# Patient Record
Sex: Female | Born: 1950 | Race: Black or African American | Hispanic: No | Marital: Married
Health system: Midwestern US, Community
[De-identification: ages and names within clinical notes are randomized; demographics above are authoritative.]

## PROBLEM LIST (undated history)

## (undated) DIAGNOSIS — M545 Low back pain, unspecified: Secondary | ICD-10-CM

## (undated) DIAGNOSIS — E041 Nontoxic single thyroid nodule: Secondary | ICD-10-CM

## (undated) DIAGNOSIS — G9341 Metabolic encephalopathy: Secondary | ICD-10-CM

## (undated) DIAGNOSIS — R079 Chest pain, unspecified: Secondary | ICD-10-CM

## (undated) DIAGNOSIS — G629 Polyneuropathy, unspecified: Secondary | ICD-10-CM

## (undated) DIAGNOSIS — R6510 Systemic inflammatory response syndrome (SIRS) of non-infectious origin without acute organ dysfunction: Principal | ICD-10-CM

## (undated) DIAGNOSIS — K2900 Acute gastritis without bleeding: Secondary | ICD-10-CM

## (undated) DIAGNOSIS — J441 Chronic obstructive pulmonary disease with (acute) exacerbation: Principal | ICD-10-CM

## (undated) DIAGNOSIS — M25551 Pain in right hip: Secondary | ICD-10-CM

## (undated) DIAGNOSIS — I10 Essential (primary) hypertension: Secondary | ICD-10-CM

## (undated) DIAGNOSIS — M5431 Sciatica, right side: Secondary | ICD-10-CM

## (undated) DIAGNOSIS — E1165 Type 2 diabetes mellitus with hyperglycemia: Secondary | ICD-10-CM

## (undated) DIAGNOSIS — F4310 Post-traumatic stress disorder, unspecified: Secondary | ICD-10-CM

## (undated) DIAGNOSIS — M1612 Unilateral primary osteoarthritis, left hip: Secondary | ICD-10-CM

## (undated) DIAGNOSIS — K439 Ventral hernia without obstruction or gangrene: Secondary | ICD-10-CM

## (undated) DIAGNOSIS — M109 Gout, unspecified: Secondary | ICD-10-CM

## (undated) DIAGNOSIS — B1920 Unspecified viral hepatitis C without hepatic coma: Secondary | ICD-10-CM

## (undated) DIAGNOSIS — R911 Solitary pulmonary nodule: Secondary | ICD-10-CM

## (undated) DIAGNOSIS — M129 Arthropathy, unspecified: Secondary | ICD-10-CM

## (undated) DIAGNOSIS — J4520 Mild intermittent asthma, uncomplicated: Secondary | ICD-10-CM

## (undated) DIAGNOSIS — Z96641 Presence of right artificial hip joint: Secondary | ICD-10-CM

## (undated) DIAGNOSIS — J45901 Unspecified asthma with (acute) exacerbation: Secondary | ICD-10-CM

## (undated) DIAGNOSIS — G8929 Other chronic pain: Secondary | ICD-10-CM

## (undated) DIAGNOSIS — Z760 Encounter for issue of repeat prescription: Secondary | ICD-10-CM

## (undated) DIAGNOSIS — G4700 Insomnia, unspecified: Secondary | ICD-10-CM

## (undated) DIAGNOSIS — E1140 Type 2 diabetes mellitus with diabetic neuropathy, unspecified: Secondary | ICD-10-CM

## (undated) DIAGNOSIS — Z1231 Encounter for screening mammogram for malignant neoplasm of breast: Secondary | ICD-10-CM

## (undated) DIAGNOSIS — F5101 Primary insomnia: Secondary | ICD-10-CM

## (undated) DIAGNOSIS — N390 Urinary tract infection, site not specified: Secondary | ICD-10-CM

## (undated) DIAGNOSIS — E119 Type 2 diabetes mellitus without complications: Secondary | ICD-10-CM

## (undated) DIAGNOSIS — M160 Bilateral primary osteoarthritis of hip: Secondary | ICD-10-CM

## (undated) DIAGNOSIS — G4733 Obstructive sleep apnea (adult) (pediatric): Secondary | ICD-10-CM

## (undated) DIAGNOSIS — M47816 Spondylosis without myelopathy or radiculopathy, lumbar region: Secondary | ICD-10-CM

## (undated) DIAGNOSIS — I509 Heart failure, unspecified: Secondary | ICD-10-CM

## (undated) DIAGNOSIS — E1121 Type 2 diabetes mellitus with diabetic nephropathy: Secondary | ICD-10-CM

## (undated) DIAGNOSIS — R109 Unspecified abdominal pain: Secondary | ICD-10-CM

## (undated) DIAGNOSIS — J309 Allergic rhinitis, unspecified: Secondary | ICD-10-CM

## (undated) DIAGNOSIS — M461 Sacroiliitis, not elsewhere classified: Secondary | ICD-10-CM

## (undated) DIAGNOSIS — M25559 Pain in unspecified hip: Secondary | ICD-10-CM

## (undated) DIAGNOSIS — M79604 Pain in right leg: Secondary | ICD-10-CM

---

## 2004-05-10 ENCOUNTER — Ambulatory Visit: Payer: Self-pay | Admitting: Family Medicine

## 2005-07-26 ENCOUNTER — Ambulatory Visit: Payer: Self-pay | Admitting: Family Medicine

## 2006-09-07 ENCOUNTER — Emergency Department: Payer: Self-pay | Admitting: Unknown Physician Specialty

## 2006-09-07 ENCOUNTER — Other Ambulatory Visit: Payer: Self-pay

## 2007-05-07 ENCOUNTER — Ambulatory Visit: Payer: Self-pay | Admitting: Family Medicine

## 2008-01-17 ENCOUNTER — Emergency Department: Payer: Self-pay | Admitting: Emergency Medicine

## 2008-01-17 ENCOUNTER — Ambulatory Visit: Payer: Self-pay | Admitting: Family Medicine

## 2008-01-21 ENCOUNTER — Ambulatory Visit: Payer: Self-pay | Admitting: Family Medicine

## 2008-09-09 ENCOUNTER — Ambulatory Visit: Payer: Self-pay

## 2008-12-01 ENCOUNTER — Emergency Department: Payer: Self-pay | Admitting: Emergency Medicine

## 2008-12-04 ENCOUNTER — Emergency Department: Payer: Self-pay | Admitting: Emergency Medicine

## 2009-02-19 ENCOUNTER — Ambulatory Visit: Payer: Self-pay | Admitting: Family Medicine

## 2009-04-27 ENCOUNTER — Ambulatory Visit: Payer: Self-pay | Admitting: Gastroenterology

## 2009-04-29 ENCOUNTER — Ambulatory Visit: Payer: Self-pay | Admitting: Gastroenterology

## 2010-02-25 ENCOUNTER — Ambulatory Visit: Payer: Self-pay | Admitting: Family Medicine

## 2011-04-27 ENCOUNTER — Ambulatory Visit: Payer: Self-pay | Admitting: Family Medicine

## 2012-05-01 ENCOUNTER — Ambulatory Visit: Payer: Self-pay | Admitting: Family Medicine

## 2013-02-19 ENCOUNTER — Ambulatory Visit: Payer: Self-pay | Admitting: Family Medicine

## 2013-03-13 ENCOUNTER — Ambulatory Visit: Payer: Self-pay | Admitting: Family Medicine

## 2013-04-23 ENCOUNTER — Ambulatory Visit: Payer: Self-pay | Admitting: Family Medicine

## 2013-05-29 ENCOUNTER — Ambulatory Visit: Payer: Self-pay | Admitting: Family Medicine

## 2013-11-01 LAB — CBC WITH DIFFERENTIAL/PLATELET
Basophil #: 0 10*3/uL (ref 0.0–0.1)
Basophil %: 0.5 %
Eosinophil #: 0.1 10*3/uL (ref 0.0–0.7)
Eosinophil %: 1.1 %
HCT: 45.3 % (ref 35.0–47.0)
HGB: 14.7 g/dL (ref 12.0–16.0)
Lymphocyte #: 1.8 10*3/uL (ref 1.0–3.6)
Lymphocyte %: 19.8 %
MCH: 28.7 pg (ref 26.0–34.0)
MCHC: 32.5 g/dL (ref 32.0–36.0)
MCV: 88 fL (ref 80–100)
MONO ABS: 0.7 x10 3/mm (ref 0.2–0.9)
MONOS PCT: 8 %
Neutrophil #: 6.5 10*3/uL (ref 1.4–6.5)
Neutrophil %: 70.6 %
Platelet: 262 10*3/uL (ref 150–440)
RBC: 5.12 10*6/uL (ref 3.80–5.20)
RDW: 13.4 % (ref 11.5–14.5)
WBC: 9.2 10*3/uL (ref 3.6–11.0)

## 2013-11-01 LAB — COMPREHENSIVE METABOLIC PANEL
ANION GAP: 5 — AB (ref 7–16)
AST: 36 U/L (ref 15–37)
Albumin: 3.5 g/dL (ref 3.4–5.0)
Alkaline Phosphatase: 66 U/L
BILIRUBIN TOTAL: 0.6 mg/dL (ref 0.2–1.0)
BUN: 11 mg/dL (ref 7–18)
CALCIUM: 9.3 mg/dL (ref 8.5–10.1)
CHLORIDE: 95 mmol/L — AB (ref 98–107)
CREATININE: 0.69 mg/dL (ref 0.60–1.30)
Co2: 30 mmol/L (ref 21–32)
GLUCOSE: 127 mg/dL — AB (ref 65–99)
Osmolality: 262 (ref 275–301)
Potassium: 3.9 mmol/L (ref 3.5–5.1)
SGPT (ALT): 36 U/L (ref 12–78)
Sodium: 130 mmol/L — ABNORMAL LOW (ref 136–145)
Total Protein: 9.4 g/dL — ABNORMAL HIGH (ref 6.4–8.2)

## 2013-11-01 LAB — URINALYSIS, COMPLETE
Bacteria: NONE SEEN
Bilirubin,UR: NEGATIVE
Blood: NEGATIVE
NITRITE: NEGATIVE
Ph: 5 (ref 4.5–8.0)
Protein: 30
Specific Gravity: 1.017 (ref 1.003–1.030)

## 2013-11-01 LAB — TROPONIN I: Troponin-I: <0.02 ng/mL

## 2013-11-01 LAB — LIPASE, BLOOD: Lipase: 58 U/L — ABNORMAL LOW (ref 73–393)

## 2013-11-02 ENCOUNTER — Inpatient Hospital Stay: Payer: Self-pay | Admitting: Family Medicine

## 2013-11-02 LAB — CREATININE, SERUM
CREATININE: 0.78 mg/dL (ref 0.60–1.30)
EGFR (African American): >60
EGFR (Non-African Amer.): >60

## 2013-11-02 LAB — HEMOGLOBIN A1C: HEMOGLOBIN A1C: 6.5 % — AB (ref 4.2–6.3)

## 2013-11-03 LAB — CBC WITH DIFFERENTIAL/PLATELET
BASOS ABS: 0 10*3/uL (ref 0.0–0.1)
Basophil %: 0.4 %
EOS ABS: 0.1 10*3/uL (ref 0.0–0.7)
Eosinophil %: 1 %
HCT: 40.3 % (ref 35.0–47.0)
HGB: 13.6 g/dL (ref 12.0–16.0)
LYMPHS ABS: 1.4 10*3/uL (ref 1.0–3.6)
Lymphocyte %: 20.3 %
MCH: 29.3 pg (ref 26.0–34.0)
MCHC: 33.8 g/dL (ref 32.0–36.0)
MCV: 87 fL (ref 80–100)
Monocyte #: 0.5 x10 3/mm (ref 0.2–0.9)
Monocyte %: 7 %
NEUTROS ABS: 4.9 10*3/uL (ref 1.4–6.5)
Neutrophil %: 71.3 %
PLATELETS: 239 10*3/uL (ref 150–440)
RBC: 4.65 10*6/uL (ref 3.80–5.20)
RDW: 13.5 % (ref 11.5–14.5)
WBC: 6.8 10*3/uL (ref 3.6–11.0)

## 2013-11-03 LAB — COMPREHENSIVE METABOLIC PANEL
ALBUMIN: 3 g/dL — AB (ref 3.4–5.0)
ALK PHOS: 53 U/L
ANION GAP: 1 — AB (ref 7–16)
BUN: 4 mg/dL — ABNORMAL LOW (ref 7–18)
Bilirubin,Total: 0.5 mg/dL (ref 0.2–1.0)
CALCIUM: 8.7 mg/dL (ref 8.5–10.1)
CO2: 32 mmol/L (ref 21–32)
Chloride: 102 mmol/L (ref 98–107)
Creatinine: 0.6 mg/dL (ref 0.60–1.30)
Glucose: 114 mg/dL — ABNORMAL HIGH (ref 65–99)
OSMOLALITY: 268 (ref 275–301)
Potassium: 3.3 mmol/L — ABNORMAL LOW (ref 3.5–5.1)
SGOT(AST): 24 U/L (ref 15–37)
SGPT (ALT): 25 U/L (ref 12–78)
SODIUM: 135 mmol/L — AB (ref 136–145)
Total Protein: 8.1 g/dL (ref 6.4–8.2)

## 2013-11-03 LAB — URINE CULTURE

## 2013-11-04 ENCOUNTER — Ambulatory Visit: Payer: Self-pay | Admitting: Orthopaedic Surgery

## 2013-11-05 LAB — BASIC METABOLIC PANEL
Anion Gap: 4 — ABNORMAL LOW (ref 7–16)
BUN: 9 mg/dL (ref 7–18)
CHLORIDE: 106 mmol/L (ref 98–107)
CO2: 28 mmol/L (ref 21–32)
CREATININE: 0.85 mg/dL (ref 0.60–1.30)
Calcium, Total: 8.9 mg/dL (ref 8.5–10.1)
EGFR (African American): >60
GLUCOSE: 86 mg/dL (ref 65–99)
OSMOLALITY: 274 (ref 275–301)
Potassium: 3.3 mmol/L — ABNORMAL LOW (ref 3.5–5.1)
Sodium: 138 mmol/L (ref 136–145)

## 2013-12-05 ENCOUNTER — Emergency Department: Payer: Self-pay | Admitting: Emergency Medicine

## 2013-12-05 LAB — COMPREHENSIVE METABOLIC PANEL
AST: 17 U/L (ref 15–37)
Albumin: 3.3 g/dL — ABNORMAL LOW (ref 3.4–5.0)
Alkaline Phosphatase: 90 U/L
Anion Gap: 4 — ABNORMAL LOW (ref 7–16)
BUN: 15 mg/dL (ref 7–18)
Bilirubin,Total: 0.5 mg/dL (ref 0.2–1.0)
CALCIUM: 9.5 mg/dL (ref 8.5–10.1)
CHLORIDE: 99 mmol/L (ref 98–107)
Co2: 34 mmol/L — ABNORMAL HIGH (ref 21–32)
Creatinine: 0.52 mg/dL — ABNORMAL LOW (ref 0.60–1.30)
EGFR (African American): >60
GLUCOSE: 142 mg/dL — AB (ref 65–99)
OSMOLALITY: 277 (ref 275–301)
Potassium: 3.4 mmol/L — ABNORMAL LOW (ref 3.5–5.1)
SGPT (ALT): 33 U/L (ref 12–78)
SODIUM: 137 mmol/L (ref 136–145)
Total Protein: 8.9 g/dL — ABNORMAL HIGH (ref 6.4–8.2)

## 2013-12-05 LAB — BODY FLUID CELL COUNT WITH DIFFERENTIAL
BASOS ABS: 0 %
Eosinophil: 0 %
Lymphocytes: 50 %
NUCLEATED CELL COUNT: 31 /mm3
Neutrophils: 11 %
OTHER CELLS BF: 0 %
OTHER MONONUCLEAR CELLS: 39 %

## 2013-12-05 LAB — CBC WITH DIFFERENTIAL/PLATELET
BASOS ABS: 0 10*3/uL (ref 0.0–0.1)
Basophil %: 0.4 %
Eosinophil #: 0.1 10*3/uL (ref 0.0–0.7)
Eosinophil %: 1 %
HCT: 45.4 % (ref 35.0–47.0)
HGB: 15.2 g/dL (ref 12.0–16.0)
LYMPHS PCT: 24.4 %
Lymphocyte #: 2.7 10*3/uL (ref 1.0–3.6)
MCH: 29.1 pg (ref 26.0–34.0)
MCHC: 33.6 g/dL (ref 32.0–36.0)
MCV: 87 fL (ref 80–100)
MONO ABS: 0.8 x10 3/mm (ref 0.2–0.9)
MONOS PCT: 6.9 %
Neutrophil #: 7.6 10*3/uL — ABNORMAL HIGH (ref 1.4–6.5)
Neutrophil %: 67.3 %
Platelet: 179 10*3/uL (ref 150–440)
RBC: 5.24 10*6/uL — AB (ref 3.80–5.20)
RDW: 13.8 % (ref 11.5–14.5)
WBC: 11.2 10*3/uL — ABNORMAL HIGH (ref 3.6–11.0)

## 2013-12-11 ENCOUNTER — Inpatient Hospital Stay: Payer: Self-pay | Admitting: Internal Medicine

## 2013-12-11 LAB — CBC
HCT: 46.6 % (ref 35.0–47.0)
HGB: 15.6 g/dL (ref 12.0–16.0)
MCH: 28.9 pg (ref 26.0–34.0)
MCHC: 33.5 g/dL (ref 32.0–36.0)
MCV: 86 fL (ref 80–100)
PLATELETS: 232 10*3/uL (ref 150–440)
RBC: 5.4 10*6/uL — ABNORMAL HIGH (ref 3.80–5.20)
RDW: 14.2 % (ref 11.5–14.5)
WBC: 11.9 10*3/uL — ABNORMAL HIGH (ref 3.6–11.0)

## 2013-12-11 LAB — COMPREHENSIVE METABOLIC PANEL
ALK PHOS: 97 U/L
ANION GAP: 7 (ref 7–16)
AST: 22 U/L (ref 15–37)
Albumin: 3.4 g/dL (ref 3.4–5.0)
BILIRUBIN TOTAL: 0.5 mg/dL (ref 0.2–1.0)
BUN: 11 mg/dL (ref 7–18)
CREATININE: 0.82 mg/dL (ref 0.60–1.30)
Calcium, Total: 9.1 mg/dL (ref 8.5–10.1)
Chloride: 105 mmol/L (ref 98–107)
Co2: 30 mmol/L (ref 21–32)
EGFR (Non-African Amer.): >60
GLUCOSE: 166 mg/dL — AB (ref 65–99)
Osmolality: 286 (ref 275–301)
Potassium: 2.9 mmol/L — ABNORMAL LOW (ref 3.5–5.1)
SGPT (ALT): 43 U/L (ref 12–78)
Sodium: 142 mmol/L (ref 136–145)
Total Protein: 9.5 g/dL — ABNORMAL HIGH (ref 6.4–8.2)

## 2013-12-11 LAB — URINALYSIS, COMPLETE
BILIRUBIN, UR: NEGATIVE
GLUCOSE, UR: NEGATIVE mg/dL (ref 0–75)
KETONE: NEGATIVE
Nitrite: NEGATIVE
PH: 6 (ref 4.5–8.0)
RBC,UR: 41 /HPF (ref 0–5)
Specific Gravity: 1.006 (ref 1.003–1.030)
Squamous Epithelial: 6
WBC UR: 5 /HPF (ref 0–5)

## 2013-12-11 LAB — DRUG SCREEN, URINE
Amphetamines, Ur Screen: NEGATIVE (ref ?–1000)
BENZODIAZEPINE, UR SCRN: NEGATIVE (ref ?–200)
Barbiturates, Ur Screen: NEGATIVE (ref ?–200)
CANNABINOID 50 NG, UR ~~LOC~~: NEGATIVE (ref ?–50)
COCAINE METABOLITE, UR ~~LOC~~: NEGATIVE (ref ?–300)
MDMA (ECSTASY) UR SCREEN: NEGATIVE (ref ?–500)
METHADONE, UR SCREEN: NEGATIVE (ref ?–300)
Opiate, Ur Screen: NEGATIVE (ref ?–300)
Phencyclidine (PCP) Ur S: NEGATIVE (ref ?–25)
TRICYCLIC, UR SCREEN: NEGATIVE (ref ?–1000)

## 2013-12-11 LAB — POTASSIUM: POTASSIUM: 3.3 mmol/L — AB (ref 3.5–5.1)

## 2013-12-11 LAB — ACETAMINOPHEN LEVEL
ACETAMINOPHEN: 8 ug/mL — AB
Acetaminophen: 301 ug/mL
Acetaminophen: 63 ug/mL — ABNORMAL HIGH

## 2013-12-11 LAB — LIPASE, BLOOD: Lipase: 94 U/L (ref 73–393)

## 2013-12-11 LAB — MAGNESIUM: Magnesium: 1.6 mg/dL — ABNORMAL LOW

## 2013-12-11 LAB — SALICYLATE LEVEL

## 2013-12-11 LAB — ETHANOL: Ethanol %: <0.003 % (ref 0.000–0.080)

## 2013-12-12 LAB — COMPREHENSIVE METABOLIC PANEL
ALBUMIN: 3 g/dL — AB (ref 3.4–5.0)
ALK PHOS: 75 U/L
ALT: 35 U/L (ref 12–78)
Anion Gap: 13 (ref 7–16)
BILIRUBIN TOTAL: 0.7 mg/dL (ref 0.2–1.0)
BUN: 15 mg/dL (ref 7–18)
Calcium, Total: 8.9 mg/dL (ref 8.5–10.1)
Chloride: 101 mmol/L (ref 98–107)
Co2: 23 mmol/L (ref 21–32)
Creatinine: 0.96 mg/dL (ref 0.60–1.30)
EGFR (Non-African Amer.): >60
GLUCOSE: 187 mg/dL — AB (ref 65–99)
OSMOLALITY: 280 (ref 275–301)
POTASSIUM: 2.9 mmol/L — AB (ref 3.5–5.1)
SGOT(AST): 20 U/L (ref 15–37)
SODIUM: 137 mmol/L (ref 136–145)
Total Protein: 8.8 g/dL — ABNORMAL HIGH (ref 6.4–8.2)

## 2013-12-12 LAB — CBC WITH DIFFERENTIAL/PLATELET
Basophil #: 0.1 10*3/uL (ref 0.0–0.1)
Basophil %: 0.4 %
EOS PCT: 0 %
Eosinophil #: 0 10*3/uL (ref 0.0–0.7)
HCT: 43.1 % (ref 35.0–47.0)
HGB: 14.5 g/dL (ref 12.0–16.0)
LYMPHS ABS: 3.6 10*3/uL (ref 1.0–3.6)
LYMPHS PCT: 19.7 %
MCH: 29 pg (ref 26.0–34.0)
MCHC: 33.7 g/dL (ref 32.0–36.0)
MCV: 86 fL (ref 80–100)
MONO ABS: 1.4 x10 3/mm — AB (ref 0.2–0.9)
MONOS PCT: 7.6 %
NEUTROS PCT: 72.3 %
Neutrophil #: 13.2 10*3/uL — ABNORMAL HIGH (ref 1.4–6.5)
PLATELETS: 295 10*3/uL (ref 150–440)
RBC: 5.01 10*6/uL (ref 3.80–5.20)
RDW: 14.6 % — AB (ref 11.5–14.5)
WBC: 18.3 10*3/uL — ABNORMAL HIGH (ref 3.6–11.0)

## 2013-12-12 LAB — SEDIMENTATION RATE: Erythrocyte Sed Rate: 6 mm/hr (ref 0–30)

## 2013-12-12 LAB — URINE CULTURE

## 2013-12-12 LAB — MAGNESIUM: Magnesium: 1.7 mg/dL — ABNORMAL LOW

## 2013-12-12 LAB — HEPATIC FUNCTION PANEL A (ARMC): BILIRUBIN DIRECT: 0.1 mg/dL (ref 0.00–0.20)

## 2013-12-12 LAB — ACETAMINOPHEN LEVEL: Acetaminophen: <2 ug/mL

## 2013-12-13 LAB — POTASSIUM: Potassium: 3.2 mmol/L — ABNORMAL LOW (ref 3.5–5.1)

## 2013-12-13 LAB — MAGNESIUM: MAGNESIUM: 1.7 mg/dL — AB

## 2013-12-13 LAB — PHOSPHORUS: Phosphorus: 1.8 mg/dL — ABNORMAL LOW (ref 2.5–4.9)

## 2013-12-14 LAB — POTASSIUM: Potassium: 3.8 mmol/L (ref 3.5–5.1)

## 2013-12-14 LAB — MAGNESIUM: Magnesium: 1.9 mg/dL

## 2013-12-14 LAB — PHOSPHORUS: PHOSPHORUS: 2.8 mg/dL (ref 2.5–4.9)

## 2013-12-16 LAB — CULTURE, BLOOD (SINGLE)

## 2014-01-08 LAB — AMB EXT HGBA1C: Hemoglobin A1c, External: 6.6

## 2014-01-26 ENCOUNTER — Emergency Department: Payer: Self-pay | Admitting: Emergency Medicine

## 2014-01-26 LAB — CBC WITH DIFFERENTIAL/PLATELET
BASOS PCT: 0.7 %
Basophil #: 0.1 10*3/uL (ref 0.0–0.1)
EOS ABS: 0.3 10*3/uL (ref 0.0–0.7)
Eosinophil %: 2.2 %
HCT: 47.6 % — ABNORMAL HIGH (ref 35.0–47.0)
HGB: 15.3 g/dL (ref 12.0–16.0)
LYMPHS PCT: 22.8 %
Lymphocyte #: 3.3 10*3/uL (ref 1.0–3.6)
MCH: 29.3 pg (ref 26.0–34.0)
MCHC: 32.1 g/dL (ref 32.0–36.0)
MCV: 91 fL (ref 80–100)
Monocyte #: 0.9 x10 3/mm (ref 0.2–0.9)
Monocyte %: 6.6 %
NEUTROS PCT: 67.7 %
Neutrophil #: 9.7 10*3/uL — ABNORMAL HIGH (ref 1.4–6.5)
PLATELETS: 274 10*3/uL (ref 150–440)
RBC: 5.22 10*6/uL — ABNORMAL HIGH (ref 3.80–5.20)
RDW: 15.6 % — AB (ref 11.5–14.5)
WBC: 14.3 10*3/uL — ABNORMAL HIGH (ref 3.6–11.0)

## 2014-01-26 LAB — COMPREHENSIVE METABOLIC PANEL
Albumin: 3.6 g/dL (ref 3.4–5.0)
Alkaline Phosphatase: 89 U/L
Anion Gap: 8 (ref 7–16)
BILIRUBIN TOTAL: 0.5 mg/dL (ref 0.2–1.0)
BUN: 15 mg/dL (ref 7–18)
CHLORIDE: 99 mmol/L (ref 98–107)
CREATININE: 0.68 mg/dL (ref 0.60–1.30)
Calcium, Total: 8.6 mg/dL (ref 8.5–10.1)
Co2: 27 mmol/L (ref 21–32)
GLUCOSE: 91 mg/dL (ref 65–99)
Osmolality: 269 (ref 275–301)
Potassium: 4.4 mmol/L (ref 3.5–5.1)
SGOT(AST): 34 U/L (ref 15–37)
SGPT (ALT): 37 U/L
SODIUM: 134 mmol/L — AB (ref 136–145)
Total Protein: 9.2 g/dL — ABNORMAL HIGH (ref 6.4–8.2)

## 2014-01-26 LAB — URINALYSIS, COMPLETE
Bilirubin,UR: NEGATIVE
Blood: NEGATIVE
Ketone: NEGATIVE
Nitrite: NEGATIVE
PH: 6 (ref 4.5–8.0)
Protein: NEGATIVE
RBC,UR: 4 /HPF (ref 0–5)
Specific Gravity: 1.01 (ref 1.003–1.030)
Squamous Epithelial: 12
WBC UR: 4 /HPF (ref 0–5)

## 2014-01-26 LAB — LIPASE, BLOOD: LIPASE: 100 U/L (ref 73–393)

## 2014-01-30 ENCOUNTER — Emergency Department: Payer: Self-pay | Admitting: Emergency Medicine

## 2014-02-11 ENCOUNTER — Emergency Department: Payer: Self-pay | Admitting: Internal Medicine

## 2014-03-05 ENCOUNTER — Ambulatory Visit: Payer: Self-pay | Admitting: Unknown Physician Specialty

## 2014-03-07 ENCOUNTER — Emergency Department: Payer: Self-pay | Admitting: Emergency Medicine

## 2014-04-18 ENCOUNTER — Ambulatory Visit: Payer: Self-pay | Admitting: Gastroenterology

## 2014-04-30 ENCOUNTER — Emergency Department: Payer: Self-pay | Admitting: Emergency Medicine

## 2014-04-30 LAB — URINALYSIS, COMPLETE
BILIRUBIN, UR: NEGATIVE
Bacteria: NONE SEEN
Blood: NEGATIVE
Ketone: NEGATIVE
NITRITE: NEGATIVE
PH: 7 (ref 4.5–8.0)
PROTEIN: NEGATIVE
Specific Gravity: 1.005 (ref 1.003–1.030)
Squamous Epithelial: 13

## 2014-05-01 LAB — URINE CULTURE

## 2014-05-10 ENCOUNTER — Emergency Department: Payer: Self-pay | Admitting: Emergency Medicine

## 2014-07-21 ENCOUNTER — Ambulatory Visit: Admit: 2014-07-21 | Discharge: 2014-07-21 | Payer: MEDICARE | Attending: Family Medicine | Primary: Physician Assistant

## 2014-07-21 DIAGNOSIS — G8929 Other chronic pain: Secondary | ICD-10-CM

## 2014-07-21 MED ORDER — OXYCODONE-ACETAMINOPHEN 10 MG-325 MG TAB
10-325 mg | ORAL_TABLET | Freq: Two times a day (BID) | ORAL | Status: DC | PRN
Start: 2014-07-21 — End: 2014-08-01

## 2014-07-21 MED ORDER — GABAPENTIN 600 MG TAB
600 mg | ORAL_TABLET | Freq: Three times a day (TID) | ORAL | Status: DC
Start: 2014-07-21 — End: 2014-08-24

## 2014-07-21 MED ORDER — MELOXICAM 7.5 MG TAB
7.5 mg | ORAL_TABLET | Freq: Every day | ORAL | Status: DC
Start: 2014-07-21 — End: 2014-08-24

## 2014-07-21 NOTE — Progress Notes (Signed)
Leg Pain        HPI: [name] FantasiaMonica [name] is a 64 y.o. female BLACK OR [place]  New patient,  Recently moved from [place], [place] last week, to take care of her 62 year old husband.  She has hx of Hypertension, dx 2001- she has not used her BP medications this morning and does not check regularly at home., She was dx with hepatitis C last year and was to undergo treatment however due to her precipitous move she was advised not to start this in [place].     She also has [facility].  She no longer has recurrent anxiety, or dreams in regards to this    She is mainly concerned today for R hip pain. This has been present for several years roughly since 2001.  Pain starts in the buttocks and radiates laterally and then across to anterior part of leg. She reports she has had MRI of the hip and possibly the low back, however she is not sure of the results of these tests. She was being managed by Dr Mathis DadScott Runyon at [facility] and has brought in her chart notes which I have reviewed. He diagnosed her with hip bursitis, chronic sacroiliac joint pain, lumbar facet arthropathy.        Past Medical History   Diagnosis Date   ??? Chronic pain    ??? Hypertension    ??? GERD (gastroesophageal reflux disease)    ??? PTSD (post-traumatic stress disorder)      [facility] [place] [facility] [place]       Current Outpatient Prescriptions   Medication Sig   ??? cyclobenzaprine (FLEXERIL) 10 mg tablet Take  by mouth three (3) times daily as needed for Muscle Spasm(s).   ??? hydrALAZINE (APRESOLINE) 10 mg tablet Take 20 mg by mouth every eight (8) hours.   ??? lisinopril (PRINIVIL, ZESTRIL) 20 mg tablet Take 40 mg by mouth daily.   ??? NIFEdipine ER (PROCARDIA XL) 60 mg ER tablet Take 60 mg by mouth daily.   ??? metoprolol (LOPRESSOR) 50 mg tablet Take  by mouth two (2) times a day.   ??? Walker misc by Does Not Apply route.   ??? oxyCODONE-acetaminophen (PERCOCET 10) 10-325 mg per tablet Take 1 Tab by  mouth two (2) times daily as needed for Pain. Max Daily Amount: 2 Tabs.   ??? gabapentin (NEURONTIN) 600 mg tablet Take 1 Tab by mouth three (3) times daily.   ??? meloxicam (MOBIC) 7.5 mg tablet Take 1 Tab by mouth daily.     No current facility-administered medications for this visit.       No Known Allergies    Past Surgical History   Procedure Laterality Date   ??? Hx gyn  2010     hysterectomy       Family History   Problem Relation Age of Onset   ??? Hypertension Mother    ??? Cancer Maternal Aunt    ??? Alcohol abuse Maternal Grandfather        History     Social History   ??? Marital Status: MARRIED     Spouse Name: N/A     Number of Children: N/A   ??? Years of Education: N/A     Occupational History   ??? Not on file.     Social History Main Topics   ??? Smoking status: Never Smoker    ??? Smokeless tobacco: Never Used   ???  Alcohol Use: No   ??? Drug Use: No   ??? Sexual Activity: No     Other Topics Concern   ??? Not on file     Social History Narrative   ??? No narrative on file       Gen- No weight loss, No Malaise, No fatigue  Eyes- No dipoplia, blurry vision  CVS- No Chest pain, no palpitations, no edema  Resp- No Cough, SOB, DOE, Wheezing  Neuro- Mild frontal headache today  Skin- No easy bruising, No rashes      BP 170/104 mmHg   Pulse 80   Temp(Src) 99 ??F (37.2 ??C) (Oral)   Resp 18   Ht 5' 6.93" (1.7 m)   Wt 177 lb 12.8 oz (80.65 kg)   BMI 27.91 kg/m2   SpO2 97%    Physical Exam   Constitutional: She is oriented to person, place, and time. She appears well-developed and well-nourished. No distress.   HENT:   Head: Normocephalic and atraumatic.   Right Ear: External ear normal.   Left Ear: External ear normal.   Eyes: EOM are normal. Pupils are equal, round, and reactive to light. Right eye exhibits no discharge. Left eye exhibits no discharge. No scleral icterus.   Cardiovascular: Normal rate, regular rhythm, normal heart sounds and intact distal pulses.    No murmur heard.   Pulmonary/Chest: Effort normal and breath sounds normal. No respiratory distress. She has no wheezes. She has no rales.   Musculoskeletal:        Lumbar back: She exhibits tenderness (L5 spinous process; tenderness over R buttock).   Neurological: She is alert and oriented to person, place, and time.   SLE + on right at 30 degrees,  SLE - on Left     Skin: Skin is warm and dry. She is not diaphoretic.   Psychiatric: She has a normal mood and affect.   Nursing note and vitals reviewed.          Assesment:  1. Hip pain, chronic, right  Will request records from previous PCP and pain management, may need referral to [facility] management  - oxyCODONE-acetaminophen (PERCOCET 10) 10-325 mg per tablet; Take 1 Tab by mouth two (2) times daily as needed for Pain. Max Daily Amount: 2 Tabs.  Dispense: 30 Tab; Refill: 0  - meloxicam (MOBIC) 7.5 mg tablet; Take 1 Tab by mouth daily.  Dispense: 60 Tab; Refill: 0    2. Sciatica, right  Increase dose of gababpentin  - gabapentin (NEURONTIN) 600 mg tablet; Take 1 Tab by mouth three (3) times daily.  Dispense: 90 Tab; Refill: 0    3. Essential hypertension  Uncontrolled. Patient has not taken medication today. Will readdress at next appt    4. Hepatitis C virus infection, unspecified chronicity  Will obtain records from previous PCP and referral to hepatologist as appropriate.        Advance Care Planning:   Patient was offered the opportunity to discuss advance care planning YES   Does patient have an Advance Directive:  NO Patient given [place] application       I have discussed the diagnosis with the patient and the intended management  The patient has received an after-visit summary and questions were answered concerning future plans.  I have discussed medication usage, side effects and warnings with the patient as well.I have reviewed the plan of care with the patient, accepted their input and they are in agreement with the  treatment goals.          Follow-up Disposition:  Return in about 6 weeks (around 09/01/2014) for hypertension.      Littie Deeds, MD                .

## 2014-07-21 NOTE — Patient Instructions (Addendum)
Please start checking your blood pressure daily for the next week.  Please email me the numbers thru MyChart in a week.    I have increased your dose of gabapentin to  three times a day.    Stop the indomethacin and use meloxicam as needed for the hip pain.    Once we get records we will refer you to a GI doctor for the hepatitis treatment and also pain management/ other specialist if needed.    Pleasure to meet you today.    DASH Diet: Care Instructions  Your Care Instructions  The DASH diet is an eating plan that can help lower your blood pressure. DASH stands for Dietary Approaches to Stop Hypertension. Hypertension is high blood pressure.  The DASH diet focuses on eating foods that are high in calcium, potassium, and magnesium. These nutrients can lower blood pressure. The foods that are highest in these nutrients are fruits, vegetables, low-fat dairy products, nuts, seeds, and legumes. But taking calcium, potassium, and magnesium supplements instead of eating foods that are high in those nutrients does not have the same effect. The DASH diet also includes whole grains, fish, and poultry.  The DASH diet is one of several lifestyle changes your doctor may recommend to lower your high blood pressure. Your doctor may also want you to decrease the amount of sodium in your diet. Lowering sodium while following the DASH diet can lower blood pressure even further than just the DASH diet alone.  Follow-up care is a key part of your treatment and safety. Be sure to make and go to all appointments, and call your doctor if you are having problems. It's also a good idea to know your test results and keep a list of the medicines you take.  How can you care for yourself at home?  Following the DASH diet  ?? Eat 4 to 5 servings of fruit each day. A serving is 1 medium-sized piece of fruit, ?? cup chopped or canned fruit, 1/4 cup dried fruit, or 4 ounces (?? cup) of fruit juice. Choose fruit more often than fruit juice.   ?? Eat 4 to 5 servings of vegetables each day. A serving is 1 cup of lettuce or raw leafy vegetables, ?? cup of chopped or cooked vegetables, or 4 ounces (?? cup) of vegetable juice. Choose vegetables more often than vegetable juice.  ?? Get 2 to 3 servings of low-fat and fat-free dairy each day. A serving is 8 ounces of milk, 1 cup of yogurt, or 1 ?? ounces of cheese.  ?? Eat 6 to 8 servings of grains each day. A serving is 1 slice of bread, 1 ounce of dry cereal, or ?? cup of cooked rice, pasta, or cooked cereal. Try to choose whole-grain products as much as possible.  ?? Limit lean meat, poultry, and fish to 2 servings each day. A serving is 3 ounces, about the size of a deck of cards.  ?? Eat 4 to 5 servings of nuts, seeds, and legumes (cooked dried beans, lentils, and split peas) each week. A serving is 1/3 cup of nuts, 2 tablespoons of seeds, or ?? cup of cooked beans or peas.  ?? Limit fats and oils to 2 to 3 servings each day. A serving is 1 teaspoon of vegetable oil or 2 tablespoons of salad dressing.  ?? Limit sweets and added sugars to 5 servings or less a week. A serving is 1 tablespoon jelly or jam, ?? cup sorbet, or 1 cup  of lemonade.  ?? Eat less than 2,300 milligrams (mg) of sodium a day. If you have high blood pressure, diabetes, or chronic kidney disease, if you are African-American, or if you are older than age 750, try to limit the amount of sodium you eat to less than 1,500 mg a day.  Tips for success  ?? Start small. Do not try to make dramatic changes to your diet all at once. You might feel that you are missing out on your favorite foods and then be more likely to not follow the plan. Make small changes, and stick with them. Once those changes become habit, add a few more changes.  ?? Try some of the following:  ?? Make it a goal to eat a fruit or vegetable at every meal and at snacks. This will make it easy to get the recommended amount of fruits and vegetables each day.   ?? Try yogurt topped with fruit and nuts for a snack or healthy dessert.  ?? Add lettuce, tomato, cucumber, and onion to sandwiches.  ?? Combine a ready-made pizza crust with low-fat mozzarella cheese and lots of vegetable toppings. Try using tomatoes, squash, spinach, broccoli, carrots, cauliflower, and onions.  ?? Have a variety of cut-up vegetables with a low-fat dip as an appetizer instead of chips and dip.  ?? Sprinkle sunflower seeds or chopped almonds over salads. Or try adding chopped walnuts or almonds to cooked vegetables.  ?? Try some vegetarian meals using beans and peas. Add garbanzo or kidney beans to salads. Make burritos and tacos with mashed pinto beans or black beans.   Where can you learn more?   Go to MetropolitanBlog.huhttp://www.healthwise.net/[facility]  Enter H967 in the search box to learn more about "DASH Diet: Care Instructions."   ?? 2006-2015 Healthwise, Incorporated. Care instructions adapted under license by [employer]  (which disclaims liability or warranty for this information). This care instruction is for use with your licensed healthcare professional. If you have questions about a medical condition or this instruction, always ask your healthcare professional. Healthwise, Incorporated disclaims any warranty or liability for your use of this information.  Content Version: 10.7.482551; Current as of: August 02, 2013

## 2014-07-21 NOTE — Progress Notes (Signed)
[name] [name] is a 64 y.o. female here today for right leg pain. Has had since last year.    Learning assessment complete.    Do you have an Advanced Directive? no    Would you like information on Advanced Directives? yes

## 2014-07-28 ENCOUNTER — Telehealth

## 2014-07-28 NOTE — Telephone Encounter (Signed)
Patient calling requesting referral to GI for hx Hep C.

## 2014-07-29 ENCOUNTER — Encounter

## 2014-08-01 ENCOUNTER — Encounter

## 2014-08-01 NOTE — Telephone Encounter (Signed)
Patient called stating her leg is no better and she is out of Percocet.

## 2014-08-04 MED ORDER — OXYCODONE-ACETAMINOPHEN 10 MG-325 MG TAB
10-325 mg | ORAL_TABLET | Freq: Two times a day (BID) | ORAL | Status: DC | PRN
Start: 2014-08-04 — End: 2014-08-19

## 2014-08-08 LAB — POC URINE MACROSCOPIC
Bilirubin: NEGATIVE
Glucose: 100 mg/dl — AB
Ketone: >=160 mg/dl — AB
Leukocyte Esterase: NEGATIVE
Nitrites: NEGATIVE
Protein: >=300 mg/dl — AB
Specific gravity: 1.025 (ref 1.005–1.030)
Urobilinogen: 0.2 EU/dl (ref 0.0–1.0)
pH (UA): 7 (ref 5–9)

## 2014-08-08 LAB — CBC WITH AUTOMATED DIFF
BASOPHILS: 0.4 % (ref 0–3)
EOSINOPHILS: 0 % (ref 0–5)
HCT: 47.7 % (ref 37.0–50.0)
HGB: 16.5 gm/dl (ref 13.0–17.2)
IMMATURE GRANULOCYTES: 0.2 % (ref 0.0–3.0)
LYMPHOCYTES: 10.5 % — ABNORMAL LOW (ref 28–48)
MCH: 28.7 pg (ref 25.4–34.6)
MCHC: 34.6 gm/dl (ref 30.0–36.0)
MCV: 83.1 fL (ref 80.0–98.0)
MONOCYTES: 2.2 % (ref 1–13)
MPV: 11.7 fL — ABNORMAL HIGH (ref 6.0–10.0)
NEUTROPHILS: 86.7 % — ABNORMAL HIGH (ref 34–64)
NRBC: 0 (ref 0–0)
PLATELET: 241 10*3/uL (ref 140–450)
RBC: 5.74 M/uL — ABNORMAL HIGH (ref 3.60–5.20)
RDW-SD: 42.6 (ref 36.4–46.3)
WBC: 11.3 10*3/uL — ABNORMAL HIGH (ref 4.0–11.0)

## 2014-08-08 LAB — METABOLIC PANEL, COMPREHENSIVE
ALT (SGPT): 34 U/L (ref 12–78)
AST (SGOT): 49 U/L — ABNORMAL HIGH (ref 15–37)
Albumin: 3.9 gm/dl (ref 3.4–5.0)
Alk. phosphatase: 104 U/L (ref 45–117)
BUN: 19 mg/dl (ref 7–25)
Bilirubin, total: 0.9 mg/dl (ref 0.2–1.0)
CO2: 23 mEq/L (ref 21–32)
Calcium: 9.4 mg/dl (ref 8.5–10.1)
Chloride: 101 mEq/L (ref 98–107)
Creatinine: 0.8 mg/dl (ref 0.6–1.3)
GFR est AA: >60
GFR est non-AA: >60
Glucose: 157 mg/dl — ABNORMAL HIGH (ref 74–106)
Potassium: 4.5 mEq/L (ref 3.5–5.1)
Protein, total: 11 gm/dl — ABNORMAL HIGH (ref 6.4–8.2)
Sodium: 133 mEq/L — ABNORMAL LOW (ref 136–145)

## 2014-08-08 LAB — LIPASE: Lipase: 50 U/L — ABNORMAL LOW (ref 73–393)

## 2014-08-08 LAB — LACTIC ACID: Lactic Acid: 1.8 mmol/L (ref 0.4–2.0)

## 2014-08-08 NOTE — ED Provider Notes (Signed)
[facility]  EMERGENCY DEPARTMENT TREATMENT REPORT  NAME:  [name] [name], [name] [name]  SEX:   F  ADMIT: 08/08/2014  DOB:   1951/01/14  MR#    [id number]  ROOM:    TIME DICTATED: 11 42 AM  ACCT#  [id number]        CHIEF COMPLAINT:  Flank pain.    HISTORY OF PRESENT ILLNESS:  This is a 64 year old female who presents with left flank pain, gradual in  onset this morning associated with nausea, vomiting times 3.  Pain is severe,  constant, 10 on a scale of 1 to 10, throbbing.  The patient has had some pain  in her back but states usually not associated with vomiting.  She is new to  the area and has been admitted to hospitals in [place], husband states  has had multiple GI studies done in the past as well as evaluation of her back  pain.  He is uncertain of all the results of the studies.  This is the first  time coming to our facility.  The patient was on Percocet for her pain.  She  has been on that for years.  Her last dose was about 2 or 3 days ago.  HPI is  difficult to obtain from patient.  She is lying on her belly moaning,  appearing in some distress.    REVIEW OF SYSTEMS:  CONSTITUTIONAL:  No history of any fever.  ENT:  No URI complaints.  RESPIRATORY:  No cough.  CARDIOVASCULAR:  No complaints of chest pain.  GASTROINTESTINAL:  Positive vomiting, denies any abdominal pain at this time.      GENITOURINARY:  No urinary complaints.  MUSCULOSKELETAL:  Positive flank pain.  INTEGUMENTARY:  No rash.  HEMATOLOGICAL:  No bruising noted.  NEUROLOGICAL:  No numbness of extremities.  Other systems no complaints.    PAST MEDICAL HISTORY:  The patient has had some back problems before.  Husband states some sciatica.  History of hypertension.  Questionable history of biliary duct obstruction.  In talking to him, it sounds like she may have an ERCP.    SOCIAL HISTORY:  They are new to the area, just moved here 3 weeks ago and saw Dr. Jac Canavan for  the first time, have not been referred to pain management yet.  The  patient  lives with spouse.  They just moved here.    FAMILY HISTORY:  Positive for hypertension.    PHYSICAL EXAMINATION:  VITAL SIGNS:  Blood 179/105, pulse 101, respirations 20, temperature 98.8, O2  sat 99%.  GENERAL:  Well-developed, well-nourished female, alert, appearing in some  distress.  HEENT:  Eyes:  Conjunctivae clear, lids normal.  Pupils equal, symmetrical,  and normally reactive.  Mouth/Throat:  Surfaces of the pharynx, palate, and  tongue are pink, moist, and without lesions.    NECK:  Supple, nontender, symmetrical, no masses or JVD, trachea midline,  thyroid not enlarged, nodular or tender.    LYMPHATIC:  No cervical or submandibular lymphadenopathy palpated.  HEART:  Sinus rhythm, no murmurs heard.  LUNGS:  Clear, no wheezing, no retractions, no respiratory distress.  ABDOMEN:  Soft, no tenderness on exam.  No guarding, no rebound, no mass  palpable.  EXTREMITIES:  No edema of lower extremities.  Negative straight leg raise.  Negative foot drop.  No erythema noted.  NEUROLOGIC:  The patient moving all extremities.  No acute focal deficits.  DP  pulses 2+ and equal bilaterally.     INITIAL  ASSESSMENT AND MANAGEMENT PLAN:  This patient apparently has been at other hospitals for similar symptoms and  has had workup.  In talking to the husband, it sounds like the patient may  have had an arthrocentesis for concerns of septic joint.  It sounds like she  may have had an ERCP in the past as well.  We have no records here on her.  He  states she has had scans done in the past as well with history of being on  chronic pain medication as well and no chronic pain provider here.  We will  obtain labs and medicate her for pain and attempt to obtain records from her  prior medical facilities that she has been at.    COURSE IN THE EMERGENCY DEPARTMENT PHYSICIAN:    Attempted to obtain records from facility in [place], [place].  The   patient was medicated morphine and Zofran IV and Ativan IV and labs were  ordered.     CONTINUATION BY DIANA HOULE, PA    DIAGNOSTIC INTERPRETATION:  Lactate normal.  Chemistry obtained.  Blood sugar 157, sodium 133.  SGOT  slightly elevated at 49, otherwise within normal limits.  CBC:  White count  11.3, hemoglobin 16.5, hematocrit 47.7.  Urine positive for ketones, negative  leukocytes, negative nitrite.    COURSE IN THE EMERGENCY DEPARTMENT:  The patient given some IV fluids for the ketonuria, given some Zofran and  morphine IV, Ativan IV and Dilaudid given.   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  The largest one measuring 8 mm with  nonobstructive calculi.    COURSE IN THE EMERGENCY DEPARTMENT:  Advised the patient of the findings of CT with possible suggestion of bone in  the sigmoid colon, but no infection.  Need for followup with her primary care.  She has also been referred to GI as well.  Apparently, she has hepatitis C and  has an appointment at the end of March with a GI doctor.  Repeat evaluation at  1302.  She is not vomiting in the ED, appears more comfortable.  Her lipase is  50.  Blood pressure improved from prior, currently 179/105, heart rate of 101.    DIAGNOSES:  1.  Vomiting.  2.  Left flank pain.  3.  History of chronic pain.    DISPOSITION:  The patient discharged.  Prescription for Zofran given.  She is to follow up  with her primary care doctor.  Follow up with GI as well.      The patient evaluated by myself and Dr. Wenda Overland, who agrees with the  above assessment and plan.      CONTINUATION BY: Victorino Dike Himmel-Sach, DO    I interviewed and examined the patient.  I discussed with the mid-level  provider and agree with their evaluation and plan as documented here.      INITIAL ASSESSMENT AND MANAGEMENT PLAN:  The patient is a 64 year old lady who presents here with back and abdominal  pain.  She states that this is different from her chronic back and abdominal  pain for which she was evaluated by [facility], in addition to a  hospital in [place], [place].  The patient and her husband who is at  bedside keep referring [place] to the computer for answers.  They  do not have many  themselves.     Laboratory testing was unremarkable.      I performed CT on her due to a ventral wall hernia that I appreciated on exam.  She has no obstruction.  She has no acute intraabdominal process.  I have  concerns for possible narcotic-seeking behavior or narcotic withdrawal issues.  The patient will be dispositioned to home, and given followup care.      ___________________  Liberty HandyJennifer H Himmel-Addisen Chappelle DO  Dictated By: Windell Hummingbirdiana A. Houle, PA    My signature above authenticates this document and my orders, the final  diagnosis (es), discharge prescription (s), and instructions in the [facility]  Pul[facility] record.  Nursing notes have been reviewed by the physician/mid-level provider.    If you have any questions please contact [phone].    MLT  D:08/08/2014 11:42:40  T: 08/08/2014 12:40:58  [id number]  Electronically Authenticated by:  Carolin GuernseyJENNIFER HIMMEL-Mac Dowdell, DO On 08/09/2014 03:22 PM EST

## 2014-08-14 ENCOUNTER — Inpatient Hospital Stay
Admit: 2014-08-14 | Discharge: 2014-08-19 | Disposition: A | Discharge status: Home / Self Care | Payer: MEDICARE | Attending: Internal Medicine | Admitting: Internal Medicine

## 2014-08-14 DIAGNOSIS — T184XXA Foreign body in colon, initial encounter: Secondary | ICD-10-CM

## 2014-08-14 LAB — METABOLIC PANEL, COMPREHENSIVE
ALT (SGPT): 26 U/L (ref 12–78)
AST (SGOT): 21 U/L (ref 15–37)
Albumin: 4.2 gm/dl (ref 3.4–5.0)
Alk. phosphatase: 93 U/L (ref 45–117)
BUN: 9 mg/dl (ref 7–25)
Bilirubin, total: 0.9 mg/dl (ref 0.2–1.0)
CO2: 27 mEq/L (ref 21–32)
Calcium: 9.1 mg/dl (ref 8.5–10.1)
Chloride: 101 mEq/L (ref 98–107)
Creatinine: 0.9 mg/dl (ref 0.6–1.3)
GFR est AA: >60
GFR est non-AA: >60
Glucose: 165 mg/dl — ABNORMAL HIGH (ref 74–106)
Potassium: 3.1 mEq/L — ABNORMAL LOW (ref 3.5–5.1)
Protein, total: 10.6 gm/dl — ABNORMAL HIGH (ref 6.4–8.2)
Sodium: 139 mEq/L (ref 136–145)

## 2014-08-14 LAB — POC URINE MACROSCOPIC
Bilirubin: NEGATIVE
Glucose: NEGATIVE mg/dl
Ketone: 40 mg/dl — AB
Leukocyte Esterase: NEGATIVE
Nitrites: NEGATIVE
Protein: >=300 mg/dl — AB
Specific gravity: 1.02 (ref 1.005–1.030)
Urobilinogen: 0.2 EU/dl (ref 0.0–1.0)
pH (UA): 8.5 (ref 5–9)

## 2014-08-14 LAB — CBC WITH AUTOMATED DIFF
BASOPHILS: 0.2 % (ref 0–3)
EOSINOPHILS: 0 % (ref 0–5)
HCT: 48.7 % (ref 37.0–50.0)
HGB: 16.4 gm/dl (ref 13.0–17.2)
IMMATURE GRANULOCYTES: 0.3 % (ref 0.0–3.0)
LYMPHOCYTES: 11.1 % — ABNORMAL LOW (ref 28–48)
MCH: 28.4 pg (ref 25.4–34.6)
MCHC: 33.7 gm/dl (ref 30.0–36.0)
MCV: 84.3 fL (ref 80.0–98.0)
MONOCYTES: 1.6 % (ref 1–13)
MPV: 11.1 fL — ABNORMAL HIGH (ref 6.0–10.0)
NEUTROPHILS: 86.8 % — ABNORMAL HIGH (ref 34–64)
NRBC: 0 (ref 0–0)
PLATELET: 259 10*3/uL (ref 140–450)
RBC: 5.78 M/uL — ABNORMAL HIGH (ref 3.60–5.20)
RDW-SD: 42.8 (ref 36.4–46.3)
WBC: 10 10*3/uL (ref 4.0–11.0)

## 2014-08-14 LAB — LIPASE: Lipase: 70 U/L — ABNORMAL LOW (ref 73–393)

## 2014-08-14 MED ORDER — SODIUM CHLORIDE 0.9% BOLUS IV
0.9 % | INTRAVENOUS | Status: AC
Start: 2014-08-14 — End: 2014-08-14
  Administered 2014-08-15: 02:00:00 via INTRAVENOUS

## 2014-08-14 MED ORDER — POTASSIUM CHLORIDE SR 10 MEQ TAB
10 mEq | ORAL | Status: AC
Start: 2014-08-14 — End: 2014-08-15

## 2014-08-14 MED ORDER — DICYCLOMINE 10 MG/ML IM SOLN
10 mg/mL | INTRAMUSCULAR | Status: AC
Start: 2014-08-14 — End: 2014-08-14
  Administered 2014-08-14: 23:00:00 via INTRAMUSCULAR

## 2014-08-14 MED ORDER — FAMOTIDINE (PF) 20 MG/2 ML IV
20 mg/2 mL | INTRAVENOUS | Status: AC
Start: 2014-08-14 — End: 2014-08-14
  Administered 2014-08-14: 23:00:00 via INTRAVENOUS

## 2014-08-14 MED ORDER — PROMETHAZINE IN NS 25 MG/50 ML IV PIGGY BAG
25 mg/50 ml | INTRAVENOUS | Status: AC
Start: 2014-08-14 — End: 2014-08-14
  Administered 2014-08-14: 22:00:00 via INTRAVENOUS

## 2014-08-14 MED ORDER — ONDANSETRON (PF) 4 MG/2 ML INJECTION
4 mg/2 mL | INTRAMUSCULAR | Status: DC | PRN
Start: 2014-08-14 — End: 2014-08-19
  Administered 2014-08-14 – 2014-08-15 (×2): via INTRAVENOUS

## 2014-08-14 MED ORDER — SALINE PERIPHERAL FLUSH PRN
Freq: Once | INTRAMUSCULAR | Status: AC
Start: 2014-08-14 — End: 2014-08-14
  Administered 2014-08-14: 22:00:00

## 2014-08-14 MED ORDER — SODIUM CHLORIDE 0.9% BOLUS IV
0.9 % | INTRAVENOUS | Status: AC
Start: 2014-08-14 — End: 2014-08-14
  Administered 2014-08-14: 22:00:00 via INTRAVENOUS

## 2014-08-14 MED ORDER — HYDROMORPHONE (PF) 1 MG/ML IJ SOLN
1 mg/mL | INTRAMUSCULAR | Status: AC
Start: 2014-08-14 — End: 2014-08-14
  Administered 2014-08-14: 23:00:00 via INTRAVENOUS

## 2014-08-14 MED FILL — BENTYL 10 MG/ML INTRAMUSCULAR SOLUTION: 10 mg/mL | INTRAMUSCULAR | Qty: 2

## 2014-08-14 MED FILL — HYDROMORPHONE (PF) 1 MG/ML IJ SOLN: 1 mg/mL | INTRAMUSCULAR | Qty: 1

## 2014-08-14 MED FILL — FAMOTIDINE (PF) 20 MG/2 ML IV: 20 mg/2 mL | INTRAVENOUS | Qty: 2

## 2014-08-14 MED FILL — PROMETHAZINE IN NS 25 MG/50 ML IV PIGGY BAG: 25 mg/50 ml | INTRAVENOUS | Qty: 50

## 2014-08-14 MED FILL — ONDANSETRON (PF) 4 MG/2 ML INJECTION: 4 mg/2 mL | INTRAMUSCULAR | Qty: 2

## 2014-08-14 NOTE — ED Notes (Signed)
Patient is writhing in pain. Dr. Carmela HurtFickenscher

## 2014-08-14 NOTE — H&P (Signed)
Chart reviewed.  Pt seen and examined on 08/14/14.  Complete H&P to follow.       Dictation # [id number]

## 2014-08-14 NOTE — ED Notes (Signed)
Patient ambulated to the restroom with a steady gait

## 2014-08-14 NOTE — ED Notes (Signed)
PICC Team arrived, Tech attempting an IV in the room, PICC team left

## 2014-08-14 NOTE — ED Provider Notes (Signed)
[facility]  EMERGENCY DEPARTMENT TREATMENT REPORT  NAME:  [name], [name]  SEX:   F  ADMIT: 08/14/2014  DOB:   April 10, 1951  MR#    [id number]  ROOM:  EA54  TIME DICTATED: 06 59 PM  ACCT#  [id number]        I hereby certify this patient for admission based upon medical necessity as   noted below.     PRIMARY CARE PHYSICIAN:  None.    TIME OF MY EVALUATION:  1624    CHIEF COMPLAINT:  Abdominal pain, nausea, vomiting, diarrhea.    HISTORY OF PRESENT ILLNESS:  A 64 year old female presents complaining of a 10 out of 10 cramping, diffuse   abdominal pain.  She states that this started last evening.  It is not   alleviated with vomiting or diarrhea.  She has vomited 6 to 7 times since last   night and had 3 episodes of diarrhea.  She denies any hematemesis or   hematochezia.  She denies fevers, but thinks that she had chills and thought   that she had a fever at home.  She denies sick contacts. She did not eat   anything yesterday.  Prior abdominal surgeries include a hysterectomy.  She   was seen at [facility] prior to arrival and they referred her to our   facility.      The patient had an EGD done in May or June of 2015 and a colonoscopy done a   few years ago, both of these were done in [place], and she tells me   that both of these were normal.      The patient was seen 1 week ago at our facility on 02/26.  She had a CT with   IV contrast that showed a foreign body in the sigmoid colon and they   questioned a bone foreign body.  There was no diverticulitis. She had mild   portal hypertension, bilateral renal cysts, and nonobstructing calculi.  She   tells me that she does have a history of vomiting and diarrhea with abdominal   pain, but prior to the last  6 episodes, it had been a long time since she   actually had abdominal pain.    REVIEW OF SYSTEMS:  CONSTITUTIONAL:  No fevers, positive for chills.  EYES:  No visual changes.  ENT:  No URI symptoms.  HEMATOLOGIC:  No bruising.   RESPIRATORY:  No difficulty breathing.  CARDIOVASCULAR:  No chest pain.  GASTROINTESTINAL:  Positive for abdominal pain.  Positive for nausea, vomiting   and diarrhea.  No hematemesis or hematochezia.  GENITOURINARY:  No urinary symptoms.  INTEGUMENTARY:  No open lesions.  NEUROLOGICAL:  No headaches.    Denies any other complaints.    PAST MEDICAL HISTORY:  Hypertension, GERD, PTSD.    FAMILY HISTORY:  Unknown.    SOCIAL HISTORY:  Nonsmoker, no alcohol use.    ALLERGIES:  NONE.    MEDICATIONS:  Amlodipine, metoprolol, gabapentin, Flexeril, hydralazine, Mobic.    PHYSICAL EXAMINATION:  VITAL SIGNS:  Blood pressure 214/145, pulse 92, respiratory rate 18,   temperature 98.3, O2 saturation is 100% on room air.  GENERAL APPEARANCE:  This is a well-developed, well-nourished female, writhing   around the bed, who appears uncomfortable.  She is screaming out in pain.  HEENT:  Eyes:  Conjunctivae clear, lids normal.  Pupils equal, symmetrical,   and normally reactive. Nonicteric.  Mouth/Throat:  Surfaces of the pharynx,  palate, and tongue are pink, moist, and without lesions.   RESPIRATORY:  Clear and equal breath sounds.  No respiratory distress,   tachypnea, or accessory muscle use.   CARDIOVASCULAR:  Heart regular, without murmurs, gallops, rubs, or thrills.   CHEST:  Chest symmetrical without masses or tenderness.     GASTROINTESTINAL: The abdomen is soft. She does not localize to any specific   area.  There is no distention, no organomegaly, no masses.  She is diffusely   tender throughout her entire abdomen with palpation.  There is no rebound or   guarding.  VASCULAR:  No peripheral edema.  SKIN:  Warm and dry, no open lesions, no rashes.  PSYCHIATRIC: Judgment appears appropriate. She does appear very anxious while   I am trying to assess her.    CONTINUATION BY Kaijah Abts A. Carmela HurtFICKENSCHER, MD:    Please see dictation by Cherlyn CushingKara DeMott.    INITIAL ASSESSMENT:   A patient with intractable abdominal pain.  We have given her a lot of   medicines.  We are going to recheck labs and, I believe, we are going to have   to rescan her because of her intractable pain.  This is a new problem for this   patient.      RESULTS:  CBC was unremarkable.  Urinalysis negative for signs of infection.  Glucose   165, potassium 3.1, otherwise unremarkable.  Lactate was normal.  CT abdomen   and pelvis shows a bone in the sigmoid colon in a transverse lie with some   surrounding stranding.    MEDICAL DECISION MAKING AND HOSPITAL COURSE:  The patient received a lot of medicines to include IV Dilaudid, Zofran,   Haldol, Benadryl, IV fluids, IM Bentyl.  Ultimately I have spoken with Dr.   Su HoffBuchberg from colorectal who will see the patient.  I have spoken with Dr.   Olen PelBrian Sullivan who will see the patient and I have spoken with Dr. Langston MaskerMorris who   will accept the patient in admission.    DISPOSITION:  Admitted in satisfactory condition.      DIAGNOSES:  1.  Acute intractable abdominal pain.  2.  Acute sigmoid colon foreign body.      ___________________  Christiana PellantBen A Beaumont Austad MD  Dictated By: Guy SandiferKara M. DeMott, PA-C    My signature above authenticates this document and my orders, the final  diagnosis (es), discharge prescription (s), and instructions in the [facility]   Pulsecheck record.  Nursing notes have been reviewed by the physician/mid-level provider.    If you have any questions please contact [phone].    JM  D:08/14/2014 18:59:47  T: 08/14/2014 09:81:1921:28:29  [id number]

## 2014-08-14 NOTE — H&P (Signed)
History and Physical    Date of Service:  08/14/14    Please see dictated H&P.     Patient: [name] [name]               Sex: female          DOA: 08/14/2014         Date of Birth:  08/18/1950      Age:  64 y.o.        LOS:  LOS: 0 days        HPI:     [name] [name] is a 64 y.o. female who c/o of persistent abdominal pain, nausea, vomiting, and diarrhea.    Pt with persistent foreign body in sigmoid colon.       Past Medical History   Diagnosis Date   ??? Chronic pain    ??? Hypertension    ??? GERD (gastroesophageal reflux disease)    ??? PTSD (post-traumatic stress disorder)      lived [place]       Past Surgical History   Procedure Laterality Date   ??? Hx gyn  2010     hysterectomy       Family History   Problem Relation Age of Onset   ??? Hypertension Mother    ??? Cancer Maternal Aunt    ??? Alcohol abuse Maternal Grandfather        History     Social History   ??? Marital Status: MARRIED     Spouse Name: N/A   ??? Number of Children: N/A   ??? Years of Education: N/A     Social History Main Topics   ??? Smoking status: Never Smoker    ??? Smokeless tobacco: Never Used   ??? Alcohol Use: No   ??? Drug Use: No   ??? Sexual Activity: No     Other Topics Concern   ??? None     Social History Narrative       Prior to Admission medications    Medication Sig Start Date End Date Taking? Authorizing Provider   amLODIPine (NORVASC) 10 mg tablet Take 10 mg by mouth daily.   Yes Phys Other, MD   cloNIDine HCl (CATAPRES) 0.3 mg tablet Take 0.3 mg by mouth two (2) times a day.   Yes Phys Other, MD   potassium chloride (K-DUR, KLOR-CON) 20 mEq tablet Take 20 mEq by mouth daily.   Yes Phys Other, MD   oxyCODONE-acetaminophen (PERCOCET 10) 10-325 mg per tablet Take 1 Tab by mouth two (2) times daily as needed for Pain. Max Daily Amount: 2 Tabs. 08/04/14   [name] K Tharakan, MD   cyclobenzaprine (FLEXERIL) 10 mg tablet Take  by mouth three (3) times daily as needed for Muscle Spasm(s).    Historical Provider    hydrALAZINE (APRESOLINE) 10 mg tablet Take 20 mg by mouth every eight (8) hours.    Historical Provider   lisinopril (PRINIVIL, ZESTRIL) 20 mg tablet Take 40 mg by mouth daily.    Historical Provider   NIFEdipine ER (PROCARDIA XL) 60 mg ER tablet Take 60 mg by mouth daily.    Historical Provider   metoprolol (LOPRESSOR) 50 mg tablet Take  by mouth two (2) times a day.    Historical Provider   Dan HumphreysWalker misc by Does Not Apply route.    Historical Provider   gabapentin (NEURONTIN) 600 mg tablet Take 1 Tab by mouth three (3) times daily.  07/21/14   Littie Deeds, MD   meloxicam (MOBIC) 7.5 mg tablet Take 1 Tab by mouth daily. 07/21/14   Littie Deeds, MD       No Known Allergies    Review of Systems    Please see dictated H&P.     Physical Exam:      Visit Vitals   Item Reading   ??? BP 175/109 mmHg   ??? Pulse 120   ??? Temp 99.1 ??F (37.3 ??C)   ??? Resp 22   ??? Ht  (1.702 m)   ??? Wt 72.576 kg (160 lb)   ??? BMI 25.05 kg/m2   ??? SpO2 98%     No intake or output data in the 24 hours ending 08/14/14 2357      Physical Exam:    Please see dictated H&P.    Labs Reviewed      Assessment/Plan     Active Problems:    Foreign body in colon (08/14/2014)    Intractable abdominal pain  Uncontrolled BP with hx of HTN  Nausea, vomiting, diarrhea  Hypokalemia    PLAN    Admit   Hydrate with IVF  Replace electrolytes as needed  Titrate pain meds to effect  GI and Colorectal surgery consults initiated in the ED  Complete H&P dictated.       Otilio Carpen, MD  08/14/2014  11:57 PM

## 2014-08-14 NOTE — ED Notes (Signed)
Patient complaining of pain and wants more pain medicine. MD informed

## 2014-08-14 NOTE — Other (Signed)
TRANSFER - OUT REPORT:    Verbal report given to  Receiving rn(name) on [name] [name]  being transferred to 5 east(unit) for routine progression of care       Report consisted of patient???s Situation, Background, Assessment and   Recommendations(SBAR).     Information from the following report(s) SBAR was reviewed with the receiving nurse.    Lines:   Peripheral IV 08/14/14 Left Antecubital (Active)        Opportunity for questions and clarification was provided.      Patient transported with:   [employer]

## 2014-08-15 ENCOUNTER — Inpatient Hospital Stay: Admit: 2014-08-15 | Payer: MEDICARE | Attending: Physician Assistant | Primary: Physician Assistant

## 2014-08-15 LAB — METABOLIC PANEL, COMPREHENSIVE
ALT (SGPT): 22 U/L (ref 12–78)
AST (SGOT): 11 U/L — ABNORMAL LOW (ref 15–37)
Albumin: 3.8 gm/dl (ref 3.4–5.0)
Alk. phosphatase: 83 U/L (ref 45–117)
BUN: 7 mg/dl (ref 7–25)
Bilirubin, total: 0.7 mg/dl (ref 0.2–1.0)
CO2: 27 mEq/L (ref 21–32)
Calcium: 8.7 mg/dl (ref 8.5–10.1)
Chloride: 101 mEq/L (ref 98–107)
Creatinine: 0.7 mg/dl (ref 0.6–1.3)
GFR est AA: >60
GFR est non-AA: >60
Glucose: 167 mg/dl — ABNORMAL HIGH (ref 74–106)
Potassium: 2.6 mEq/L — CL (ref 3.5–5.1)
Protein, total: 9.6 gm/dl — ABNORMAL HIGH (ref 6.4–8.2)
Sodium: 138 mEq/L (ref 136–145)

## 2014-08-15 LAB — CBC WITH AUTOMATED DIFF
BASOPHILS: 0.2 % (ref 0–3)
EOSINOPHILS: 0 % (ref 0–5)
HCT: 46.4 % (ref 37.0–50.0)
HGB: 15.7 gm/dl (ref 13.0–17.2)
IMMATURE GRANULOCYTES: 0.5 % (ref 0.0–3.0)
LYMPHOCYTES: 10.9 % — ABNORMAL LOW (ref 28–48)
MCH: 28.4 pg (ref 25.4–34.6)
MCHC: 33.8 gm/dl (ref 30.0–36.0)
MCV: 84.1 fL (ref 80.0–98.0)
MONOCYTES: 6.2 % (ref 1–13)
MPV: 11.5 fL — ABNORMAL HIGH (ref 6.0–10.0)
NEUTROPHILS: 82.2 % — ABNORMAL HIGH (ref 34–64)
NRBC: 0 (ref 0–0)
PLATELET: 300 10*3/uL (ref 140–450)
RBC: 5.52 M/uL — ABNORMAL HIGH (ref 3.60–5.20)
RDW-SD: 43.9 (ref 36.4–46.3)
WBC: 17.7 10*3/uL — ABNORMAL HIGH (ref 4.0–11.0)

## 2014-08-15 LAB — PTT: aPTT: 34.7 seconds (ref 24.9–35.6)

## 2014-08-15 LAB — GLUCOSE, POC
Glucose (POC): 171 mg/dL — ABNORMAL HIGH (ref 65–105)
Glucose (POC): 103 mg/dL (ref 65–105)

## 2014-08-15 LAB — PROTHROMBIN TIME + INR
INR: 1.1 (ref 0.0–1.1)
Prothrombin time: 14.3 seconds — ABNORMAL HIGH (ref 11.5–14.0)

## 2014-08-15 MED ORDER — HYDROMORPHONE (PF) 1 MG/ML IJ SOLN
1 mg/mL | Freq: Once | INTRAMUSCULAR | Status: AC
Start: 2014-08-15 — End: 2014-08-15
  Administered 2014-08-15: 07:00:00 via INTRAVENOUS

## 2014-08-15 MED ORDER — HYDRALAZINE 10 MG TAB
10 mg | Freq: Three times a day (TID) | ORAL | Status: DC
Start: 2014-08-15 — End: 2014-08-15
  Administered 2014-08-15: 20:00:00 via ORAL

## 2014-08-15 MED ORDER — LORAZEPAM 2 MG/ML SYRINGE
2 mg/mL | INTRAMUSCULAR | Status: DC | PRN
Start: 2014-08-15 — End: 2014-08-19
  Administered 2014-08-18: 20:00:00 via INTRAVENOUS

## 2014-08-15 MED ORDER — GABAPENTIN 300 MG CAP
300 mg | Freq: Three times a day (TID) | ORAL | Status: DC
Start: 2014-08-15 — End: 2014-08-19
  Administered 2014-08-15 – 2014-08-19 (×13): via ORAL

## 2014-08-15 MED ORDER — SODIUM CHLORIDE 0.9 % IV
INTRAVENOUS | Status: DC
Start: 2014-08-15 — End: 2014-08-18
  Administered 2014-08-15 – 2014-08-18 (×5): via INTRAVENOUS

## 2014-08-15 MED ORDER — REMOVE PATCH (GENERIC)
Freq: Once | Status: DC
Start: 2014-08-15 — End: 2014-08-19

## 2014-08-15 MED ORDER — AMLODIPINE 5 MG TAB
5 mg | Freq: Every day | ORAL | Status: DC
Start: 2014-08-15 — End: 2014-08-19
  Administered 2014-08-15 – 2014-08-19 (×7): via ORAL

## 2014-08-15 MED ORDER — HYDRALAZINE 20 MG/ML IJ SOLN
20 mg/mL | Freq: Once | INTRAMUSCULAR | Status: AC
Start: 2014-08-15 — End: 2014-08-15
  Administered 2014-08-15: 08:00:00 via INTRAVENOUS

## 2014-08-15 MED ORDER — SODIUM CHLORIDE 0.9 % IJ SYRG
INTRAMUSCULAR | Status: AC
Start: 2014-08-15 — End: 2014-08-15
  Administered 2014-08-15: 08:00:00

## 2014-08-15 MED ORDER — SODIUM CHLORIDE 0.9 % IJ SYRG
INTRAMUSCULAR | Status: AC
Start: 2014-08-15 — End: 2014-08-15
  Administered 2014-08-15: 17:00:00

## 2014-08-15 MED ORDER — IOPAMIDOL 76 % IV SOLN
370 mg iodine /mL (76 %) | Freq: Once | INTRAVENOUS | Status: AC
Start: 2014-08-15 — End: 2014-08-14
  Administered 2014-08-15: 02:00:00 via INTRAVENOUS

## 2014-08-15 MED ORDER — METOPROLOL TARTRATE 50 MG TAB
50 mg | Freq: Two times a day (BID) | ORAL | Status: DC
Start: 2014-08-15 — End: 2014-08-15
  Administered 2014-08-15: 14:00:00 via ORAL

## 2014-08-15 MED ORDER — ENOXAPARIN 40 MG/0.4 ML SUB-Q SYRINGE
40 mg/0.4 mL | SUBCUTANEOUS | Status: DC
Start: 2014-08-15 — End: 2014-08-19
  Administered 2014-08-17 – 2014-08-19 (×3): via SUBCUTANEOUS

## 2014-08-15 MED ORDER — ACETAMINOPHEN (TYLENOL) SOLUTION 32MG/ML
ORAL | Status: DC | PRN
Start: 2014-08-15 — End: 2014-08-19

## 2014-08-15 MED ORDER — HYDRALAZINE 20 MG/ML IJ SOLN
20 mg/mL | Freq: Once | INTRAMUSCULAR | Status: AC
Start: 2014-08-15 — End: 2014-08-15
  Administered 2014-08-15: 13:00:00 via INTRAVENOUS

## 2014-08-15 MED ORDER — HYDROMORPHONE (PF) 1 MG/ML IJ SOLN
1 mg/mL | INTRAMUSCULAR | Status: DC | PRN
Start: 2014-08-15 — End: 2014-08-19
  Administered 2014-08-15 – 2014-08-19 (×34): via INTRAVENOUS

## 2014-08-15 MED ORDER — ONDANSETRON (PF) 4 MG/2 ML INJECTION
4 mg/2 mL | Freq: Four times a day (QID) | INTRAMUSCULAR | Status: DC | PRN
Start: 2014-08-15 — End: 2014-08-15

## 2014-08-15 MED ORDER — HYDRALAZINE 20 MG/ML IJ SOLN
20 mg/mL | INTRAMUSCULAR | Status: AC
Start: 2014-08-15 — End: 2014-08-15
  Administered 2014-08-15: 05:00:00 via INTRAVENOUS

## 2014-08-15 MED ORDER — ACETAMINOPHEN 325 MG TABLET
325 mg | ORAL | Status: DC | PRN
Start: 2014-08-15 — End: 2014-08-19

## 2014-08-15 MED ORDER — NALOXONE 0.4 MG/ML INJECTION
0.4 mg/mL | INTRAMUSCULAR | Status: DC | PRN
Start: 2014-08-15 — End: 2014-08-19

## 2014-08-15 MED ORDER — CLONIDINE 0.2 MG/24 HR WEEKLY TRANSDERM PATCH
0.2 mg/24 hr | TRANSDERMAL | Status: DC
Start: 2014-08-15 — End: 2014-08-19

## 2014-08-15 MED ORDER — HYDROMORPHONE (PF) 1 MG/ML IJ SOLN
1 mg/mL | INTRAMUSCULAR | Status: DC | PRN
Start: 2014-08-15 — End: 2014-08-15
  Administered 2014-08-15 (×3): via INTRAVENOUS

## 2014-08-15 MED ORDER — DIPHENHYDRAMINE HCL 50 MG/ML IJ SOLN
50 mg/mL | INTRAMUSCULAR | Status: AC
Start: 2014-08-15 — End: 2014-08-14
  Administered 2014-08-15: 02:00:00 via INTRAVENOUS

## 2014-08-15 MED ORDER — HALOPERIDOL LACTATE 5 MG/ML IJ SOLN
5 mg/mL | INTRAMUSCULAR | Status: AC
Start: 2014-08-15 — End: 2014-08-14
  Administered 2014-08-15: 02:00:00 via INTRAMUSCULAR

## 2014-08-15 MED ORDER — METOPROLOL TARTRATE 100 MG TAB
100 mg | Freq: Two times a day (BID) | ORAL | Status: DC
Start: 2014-08-15 — End: 2014-08-19
  Administered 2014-08-15 – 2014-08-19 (×9): via ORAL

## 2014-08-15 MED ORDER — HYDRALAZINE 20 MG/ML IJ SOLN
20 mg/mL | Freq: Four times a day (QID) | INTRAMUSCULAR | Status: DC | PRN
Start: 2014-08-15 — End: 2014-08-19
  Administered 2014-08-16 – 2014-08-19 (×6): via INTRAVENOUS

## 2014-08-15 MED ORDER — PIPERACILLIN-TAZOBACTAM 3.375 GRAM IV SOLR
3.375 gram | Freq: Three times a day (TID) | INTRAVENOUS | Status: DC
Start: 2014-08-15 — End: 2014-08-19
  Administered 2014-08-15 – 2014-08-19 (×13): via INTRAVENOUS

## 2014-08-15 MED ORDER — HYDROMORPHONE (PF) 1 MG/ML IJ SOLN
1 mg/mL | INTRAMUSCULAR | Status: DC | PRN
Start: 2014-08-15 — End: 2014-08-15
  Administered 2014-08-15: 05:00:00 via INTRAVENOUS

## 2014-08-15 MED ORDER — HYDRALAZINE 20 MG/ML IJ SOLN
20 mg/mL | Freq: Four times a day (QID) | INTRAMUSCULAR | Status: DC | PRN
Start: 2014-08-15 — End: 2014-08-15
  Administered 2014-08-15: 16:00:00 via INTRAVENOUS

## 2014-08-15 MED ORDER — SODIUM CHLORIDE 0.9 % IJ SYRG
Freq: Once | INTRAMUSCULAR | Status: AC
Start: 2014-08-15 — End: 2014-08-14
  Administered 2014-08-15: 02:00:00 via INTRAVENOUS

## 2014-08-15 MED ORDER — LISINOPRIL 20 MG TAB
20 mg | Freq: Every day | ORAL | Status: DC
Start: 2014-08-15 — End: 2014-08-19
  Administered 2014-08-15 – 2014-08-19 (×7): via ORAL

## 2014-08-15 MED ORDER — CLONIDINE 0.1 MG/24 HR WEEKLY TRANSDERM PATCH
0.1 mg/24 hr | TRANSDERMAL | Status: DC
Start: 2014-08-15 — End: 2014-08-15

## 2014-08-15 MED ORDER — ENOXAPARIN 40 MG/0.4 ML SUB-Q SYRINGE
40 mg/0.4 mL | SUBCUTANEOUS | Status: DC
Start: 2014-08-15 — End: 2014-08-15
  Administered 2014-08-15: 12:00:00 via SUBCUTANEOUS

## 2014-08-15 MED ORDER — SODIUM CHLORIDE 0.9 % IV
INTRAVENOUS | Status: AC
Start: 2014-08-15 — End: 2014-08-15
  Administered 2014-08-15: 07:00:00 via INTRAVENOUS

## 2014-08-15 MED ORDER — HYDROMORPHONE (PF) 1 MG/ML IJ SOLN
1 mg/mL | INTRAMUSCULAR | Status: AC
Start: 2014-08-15 — End: 2014-08-14
  Administered 2014-08-15: 03:00:00 via INTRAVENOUS

## 2014-08-15 MED ORDER — SODIUM CHLORIDE 0.9 % IJ SYRG
INTRAMUSCULAR | Status: AC
Start: 2014-08-15 — End: 2014-08-15
  Administered 2014-08-15: 07:00:00

## 2014-08-15 MED ORDER — POTASSIUM CHLORIDE 10 MEQ/100 ML IV PIGGY BACK
10 mEq/0 mL | INTRAVENOUS | Status: AC
Start: 2014-08-15 — End: 2014-08-15
  Administered 2014-08-15 (×3): via INTRAVENOUS

## 2014-08-15 MED ORDER — ACETAMINOPHEN 650 MG RECTAL SUPPOSITORY
650 mg | RECTAL | Status: DC | PRN
Start: 2014-08-15 — End: 2014-08-19

## 2014-08-15 MED ORDER — SODIUM CHLORIDE 0.9 % IV
INTRAVENOUS | Status: DC
Start: 2014-08-15 — End: 2014-08-15
  Administered 2014-08-15: 19:00:00 via INTRAVENOUS

## 2014-08-15 MED FILL — POTASSIUM CHLORIDE 10 MEQ/100 ML IV PIGGY BACK: 10 mEq/0 mL | INTRAVENOUS | Qty: 100

## 2014-08-15 MED FILL — ISOVUE-370  76 % INTRAVENOUS SOLUTION: 370 mg iodine /mL (76 %) | INTRAVENOUS | Qty: 100

## 2014-08-15 MED FILL — HYDROMORPHONE (PF) 1 MG/ML IJ SOLN: 1 mg/mL | INTRAMUSCULAR | Qty: 1

## 2014-08-15 MED FILL — HYDRALAZINE 10 MG TAB: 10 mg | ORAL | Qty: 2

## 2014-08-15 MED FILL — DIPHENHYDRAMINE HCL 50 MG/ML IJ SOLN: 50 mg/mL | INTRAMUSCULAR | Qty: 1

## 2014-08-15 MED FILL — SODIUM CHLORIDE 0.9 % IV PIGGY BACK: INTRAVENOUS | Qty: 100

## 2014-08-15 MED FILL — BD POSIFLUSH NORMAL SALINE 0.9 % INJECTION SYRINGE: INTRAMUSCULAR | Qty: 10

## 2014-08-15 MED FILL — METOPROLOL TARTRATE 50 MG TAB: 50 mg | ORAL | Qty: 1

## 2014-08-15 MED FILL — ONDANSETRON (PF) 4 MG/2 ML INJECTION: 4 mg/2 mL | INTRAMUSCULAR | Qty: 2

## 2014-08-15 MED FILL — HYDRALAZINE 20 MG/ML IJ SOLN: 20 mg/mL | INTRAMUSCULAR | Qty: 1

## 2014-08-15 MED FILL — LORAZEPAM 2 MG/ML SYRINGE: 2 mg/mL | INTRAMUSCULAR | Qty: 0.3

## 2014-08-15 MED FILL — AMLODIPINE 5 MG TAB: 5 mg | ORAL | Qty: 2

## 2014-08-15 MED FILL — SODIUM CHLORIDE 0.9 % IV: INTRAVENOUS | Qty: 1000

## 2014-08-15 MED FILL — GABAPENTIN 300 MG CAP: 300 mg | ORAL | Qty: 2

## 2014-08-15 MED FILL — PIPERACILLIN-TAZOBACTAM 3.375 GRAM IV SOLR: 3.375 gram | INTRAVENOUS | Qty: 3.38

## 2014-08-15 MED FILL — LISINOPRIL 20 MG TAB: 20 mg | ORAL | Qty: 2

## 2014-08-15 MED FILL — CLONIDINE 0.1 MG/24 HR WEEKLY TRANSDERM PATCH: 0.1 mg/24 hr | TRANSDERMAL | Qty: 1

## 2014-08-15 MED FILL — REMOVE PATCH (GENERIC): Qty: 1

## 2014-08-15 MED FILL — CLONIDINE 0.2 MG/24 HR WEEKLY TRANSDERM PATCH: 0.2 mg/24 hr | TRANSDERMAL | Qty: 1

## 2014-08-15 MED FILL — LOVENOX 40 MG/0.4 ML SUBCUTANEOUS SYRINGE: 40 mg/0.4 mL | SUBCUTANEOUS | Qty: 0.4

## 2014-08-15 MED FILL — HALOPERIDOL LACTATE 5 MG/ML IJ SOLN: 5 mg/mL | INTRAMUSCULAR | Qty: 1

## 2014-08-15 MED FILL — METOPROLOL TARTRATE 100 MG TAB: 100 mg | ORAL | Qty: 1

## 2014-08-15 NOTE — Progress Notes (Signed)
Dr Tenny Crawerefe    [name] in [phone]  Critical lab potassium 2.6  Geoffery LyonsBrenda [id number]

## 2014-08-15 NOTE — Other (Signed)
Dr. Langston MaskerMorris aware of elevated bp. New orders given. Will continue to monitor.

## 2014-08-15 NOTE — Progress Notes (Signed)
Dr Tenny Crawerefe beeped for VS/pt condition  Awaiting return call

## 2014-08-15 NOTE — Other (Signed)
----------  DocumentID:   [id number]------------------------------------------------              [facility]                       Patient Education Report         Name: Tylene FantasiaOWENS, Sheyla                  Date: 08/15/2014    MRN: [id number]                    Time: 1:29:34 AM         Patient ordered video: Patient Safety: Stay Safe While you are in the   Hospital    from [id number] via phone number: [phone] at 1:29:34 AM

## 2014-08-15 NOTE — Progress Notes (Signed)
Dr Tenny Crawerefe returned call and aware of VS and pt appearance.  To enter orders in computer

## 2014-08-15 NOTE — Consults (Addendum)
Gastroenterology Consult    Patient: [name] [name] MRN: [id number]  SSN: [id number]    Date of Birth: 03-26-1951  Age: 64 y.o.  Sex: female        Assessment:   1) N/V, Abdominal Pain (diffuse more prominent to mid lower abdomen) that has progressively worsened over the last 2weeks. CT notes colonic diverticulosis with apparent ingested animal bone or other ingested material in the mid sigmoid in transverse orientation without frank evidence of perforation. Pt reports prior EGD/Colonoscopy 11/2013 in [place] as normal. We do not have the reports available for review at this time  2) GERD maintained on Prilosec   3) Chronic Pain Syndrome-followed by pain management in [place]   Plan:   1) Monitor H&H, CBC, BMP  2) Maintain NPO status   3) Start Protonix 40mg  every day   4) On call for Flexible Sigmoidoscopy with MAC 08/15/14 (Benefits, alternatives and risks to include IV sedation, infection, bleeding, perforation, cardiopulmonary arrest were discussed with the pt and she wishes to proceed)  5) More recommendations pending above results and patients clinical condition   Subjective:     [name] [name] is a 64 y.o. Female who presents to the hospital for evaluation of nausea, vomiting and worsening abdominal pain x 2weeks. During ER evaluation CT notes colonic diverticulosis with apparent ingested animal bone or other ingested material in the mid sigmoid in transverse orientation without frank evidence of perforation. The pt states that she began feeling bad about 2 weeks ago with onset of nausea and vomiting x multiple episodes. Her last episode of vomiting was yesterday of bilious fluid. She denies hematemesis. She also reports associated constant abdominal pain that she describes as diffuse and more prominent to her mid lower abdomen 10/10. She states "I had sushi but I have not any thing with bones in it." She reports episodes of chills but denies fever, SOB, chest pain, dysphagia, odynophagia, change in appetite  or weight loss. Her normal bowel pattern consist of daily to every other day soft brown colored stool which changed yesterday to loose-watery in consistency with her last BM yesterday. She denies hematochezia or melena. Pt reports prior EGD/Colonoscopy 11/2013 in [place] as normal. We do not have the reports available for review at this time.    Chief Complaint:   Chief Complaint   Patient presents with   ??? Abdominal Pain   ??? Vomiting     Hospital Problems  Date Reviewed: Aug 12, 2014          Codes Class Noted POA    Foreign body in colon ICD-10-CM: T18.4XXA  ICD-9-CM: 936  08/14/2014 Unknown            Past Medical History   Diagnosis Date   ??? Chronic pain    ??? Hypertension    ??? GERD (gastroesophageal reflux disease)    ??? PTSD (post-traumatic stress disorder)      lived through the [place]     Past Surgical History   Procedure Laterality Date   ??? Hx gyn  2010     hysterectomy      Family History   Problem Relation Age of Onset   ??? Hypertension Mother    ??? Cancer Maternal Aunt    ??? Alcohol abuse Maternal Grandfather      History   Substance Use Topics   ??? Smoking status: Never Smoker    ??? Smokeless tobacco: Never Used   ??? Alcohol Use: No  Current Facility-Administered Medications   Medication Dose Route Frequency Provider Last Rate Last Dose   ??? HYDROmorphone (PF) (DILAUDID) injection 1 mg  1 mg IntraVENous Q3H PRN Otilio Carpen, MD   1 mg at 08/15/14 0631   ??? amLODIPine (NORVASC) tablet 10 mg  10 mg Oral DAILY Otilio Carpen, MD       ??? gabapentin (NEURONTIN) capsule 600 mg  600 mg Oral TID Otilio Carpen, MD   600 mg at 08/15/14 0847   ??? hydrALAZINE (APRESOLINE) tablet 20 mg  20 mg Oral Q8H Otilio Carpen, MD   Stopped at 08/15/14 0600   ??? lisinopril (PRINIVIL, ZESTRIL) tablet 40 mg  40 mg Oral DAILY Otilio Carpen, MD       ??? metoprolol (LOPRESSOR) tablet 50 mg  50 mg Oral BID Otilio Carpen, MD   50 mg at 08/15/14 0848    ??? naloxone (NARCAN) injection 0.1 mg  0.1 mg IntraVENous PRN Otilio Carpen, MD       ??? 0.9% sodium chloride infusion  100 mL/hr IntraVENous CONTINUOUS Otilio Carpen, MD   Stopped at 08/15/14 0600   ??? acetaminophen (TYLENOL) tablet 325 mg  325 mg Oral Q4H PRN Otilio Carpen, MD        Or   ??? acetaminophen (TYLENOL) solution 325 mg  325 mg Oral Q4H PRN Otilio Carpen, MD        Or   ??? acetaminophen (TYLENOL) suppository 325 mg  325 mg Rectal Q4H PRN Otilio Carpen, MD       ??? piperacillin-tazobactam (ZOSYN) 3.375 g in 0.9% sodium chloride (MBP/ADV) 100 mL MBP  3.375 g IntraVENous Q8H Otilio Carpen, MD 25 mL/hr at 08/15/14 0630 3.375 g at 08/15/14 0630   ??? enoxaparin (LOVENOX) injection 40 mg  40 mg SubCUTAneous Q24H Otilio Carpen, MD   40 mg at 08/15/14 0631   ??? cloNIDine (CATAPRES) 0.2 mg/24 hr patch 1 Patch  1 Patch TransDERmal Q7D Otilio Carpen, MD   1 Patch at 08/15/14 0734   ??? [START ON 08/22/2014] remove patch reminder  1 Patch Topical ONCE Otilio Carpen, MD       ??? sodium chloride (NS) 0.9 % flush            ??? ondansetron (ZOFRAN) injection 4 mg  4 mg IntraVENous Q4H PRN Carmelina Dane Demott, PA-C   4 mg at 08/14/14 1829   ??? ondansetron (ZOFRAN) injection 4 mg  4 mg IntraVENous Q6H PRN Christiana Pellant, MD            No Known Allergies    Review of Systems:  A 12 point review of systems were obtained with pertinent positives noted in above HPI, otherwise negative     Objective:     Patient Vitals for the past 24 hrs:   Temp Pulse Resp BP SpO2   08/15/14 0750 98.3 ??F (36.8 ??C) (!) 111 18 (!) 194/103 mmHg 96 %   08/15/14 0420 98.4 ??F (36.9 ??C) (!) 129 21 (!) 210/127 mmHg 98 %   08/15/14 0246 - (!) 128 - (!) 199/140 mmHg -   08/15/14 0134 99.3 ??F (37.4 ??C) (!) 131 22 (!) 201/134 mmHg 98 %   08/15/14 0047 98.9 ??F (37.2 ??C) (!) 117 - (!) 199/104 mmHg 99 %   08/14/14 2337 99.1 ??F (37.3 ??C) (!) 120 22 (!) 175/109 mmHg 98 %   08/14/14 2148 - - - -  99 %   08/14/14 2117 - - - - 97 %    08/14/14 1922 - (!) 118 22 (!) 226/153 mmHg 98 %   08/14/14 1606 - - - (!) 214/145 mmHg -   08/14/14 1605 98.3 ??F (36.8 ??C) 92 18 (!) 0/0 mmHg 100 %         Intake/Output Summary (Last 24 hours) at 08/15/14 0856  Last data filed at 08/15/14 0630   Gross per 24 hour   Intake    725 ml   Output   1400 ml   Net   -675 ml       Physical Exam:  GENERAL: NAD, alert, cooperative, appears stated age  LUNG: clear to auscultation bilaterally, symmetric chest expansion  HEART: tachycardic  ABDOMEN: soft, diffuse tenderness more prominent to mid lower abdomen. Bowel sounds normal.   EXTREMITIES:  extremities normal, atraumatic, no cyanosis or edema    Diagnostic Imaging:  Ct Abd Pelv W Cont    08/15/2014   Addendum: There is a 4 mm calcification at the posterior right margin of  the bladder which could indicate a stone although no evidence of ureteral  dilatation seen above this point. Correlation with clinical data needed. This was  present on prior study of 08/10/2014.    08/15/2014   TECHNIQUE:  CT of the abdomen and pelvis WITH intravenous contrast.  The abdomen and pelvis were scanned utilizing a multidetector helical scanner from the diaphragm to the lesser trochanter without the oral administration of barium.  Coronal and sagittal reformations were obtained. Preliminary report by [facility].   COMPARISON: CT abdomen pelvis 08/08/2014  INDICATION: Abdominal pain, diarrhea, vomiting   DISCUSSION:  .  LOWER THORAX: Bullous disease both lung bases with linear atelectasis versus scarring. Heart normal size.  HEPATOBILIARY: No focal hepatic lesions.  Gallbladder unremarkable. No biliary ductal dilatation. SPLEEN: No splenomegaly or focal lesion. PANCREAS: No focal masses or ductal dilatation.  ADRENALS: No adrenal nodules. KIDNEYS/URETERS: Bilateral scattered low-density foci measuring 1 cm or less in size suggesting cyst. 8 x 3 mm calcific density right lower pole with 3 mm rounded calcification  left lower renal pole with no hydronephrosis or hydroureter on either side. PELVIC ORGANS/BLADDER: Hysterectomy. Bladder unremarkable.  PERITONEUM / RETROPERITONEUM: No free air or fluid. LYMPH NODES: No lymphadenopathy. VESSELS: Unremarkable.  GI TRACT: Extensive diverticulosis of the colon especially in the sigmoid region. Large calcific density that may represent ingested abnormal bone measuring 5.6 cm x 9 mm mid sigmoid and transverse orientation. No evidence of adjacent free air or significant surrounding inflammatory change. No high-grade obstruction. Appendix normal.   BONES AND SOFT TISSUES: Degenerative narrowing both hip joints with sclerosis and subchondral cysts but normal femoral head contours.     08/15/2014   IMPRESSION:  1. Centrilobular emphysema. 2. Bilateral renal lesions suggesting cysts and nonobstructing stones. 3. Hysterectomy. 4. Colonic diverticulosis with apparent ingested animal bone or other ingested material in the mid sigmoid in transverse orientation without frank evidence of perforation. This is little changed from prior study. 5. Degenerative joint disease both hips.         Laboratory Results:    Labs: Results:   Chemistry Recent Labs      08/15/14   0629  08/14/14   1704   GLU  167*  165*   NA  138  139   K  2.6*  3.1*   CL  101  101   CO2  27  27   BUN  7  9   CREA  0.7  0.9   CA  8.7  9.1   AP  83  93   TP  9.6*  10.6*   ALB  3.8  4.2    Estimated Creatinine Clearance: 90 mL/min (based on Cr of 0.7).   CBC w/Diff Recent Labs      08/15/14   0629  08/14/14   1704   WBC  17.7*  10.0   RBC  5.52*  5.78*   HGB  15.7  16.4   HCT  46.4  48.7   PLT  300  259   GRANS  82.2*  86.8*   LYMPH  10.9*  11.1*   EOS  0.0  0.0      Cardiac Enzymes No results for input(s): CPK, CKND1, MYO in the last 72 hours.    Invalid input(s): CKRMB, TROIP   Coagulation Recent Labs      08/15/14   0629   PTP  14.3*   INR  1.1   APTT  34.7        Hepatitis Panel No results found for: HAMAT, HAAB, HABT, HAAT, XHBAGT, HBSAG, HBAGT, HBSB, HBSAT, HBABN, HBCM, HBCAB, HBCAT, XBCABS, HBEAB, HBEAG, HCAB, XHEPCS, [id number], HBEGLT, HBCMLT, HBCLT, HBEBLT, [id number], [id number], HAVMLT, [id number], HBCMLT, [id number], HCGAT   Amylase Lipase    Liver Enzymes Recent Labs      08/15/14   0629   TP  9.6*   ALB  3.8   AP  83   SGOT  11*   ALT  22      Thyroid Studies No results for input(s): T4, T3U, TSH, TSHEXT in the last 72 hours.    Invalid input(s): T3RU     Pathology pathology         Signed By: Myrla Halstedhantel Dixon MS, APRN, FNP-C    August 15, 2014     Office: [phone]       [facility] Staff note:  Patient seen and examined.  Patient with abdominal pain.  Found to have foreign body in the sigmoid colon.  Patient is unable to give a history of what this could be.  Attempted to perform sigmoidoscopy today but cancelled due to hypokalemia.  Will plan on attempting flexible sigmoidsocopy tomorrow.  -- Olen PelBrian Sumi Lye, MD

## 2014-08-15 NOTE — Other (Signed)
Bedside and Verbal shift change report given to Steve RN (oncoBrett Canalesming nurse) by Lajuana RippleBrenda RN (offgoing nurse). Report included the following information Kardex, Procedure Summary, Intake/Output, Recent Results and Med Rec Status.

## 2014-08-15 NOTE — Progress Notes (Signed)
Medical Progress Note      NAME: [name] [name]   DOB:  07/23/1950  MRM:  [id number]    Date/Time: 08/15/2014  9:30 AM         Subjective:   Abdominal pain, no nausea or vomiting, no fever or chills       Objective:       Vitals:      Last 24hrs VS reviewed since prior progress note. Most recent are:    Visit Vitals   Item Reading   ??? BP 201/128 mmHg   ??? Pulse 128   ??? Temp 97.8 ??F (36.6 ??C)   ??? Resp 20   ??? Ht 5' 7" (1.702 m)   ??? Wt 78.5 kg (173 lb 1 oz)   ??? BMI 27.10 kg/m2   ??? SpO2 99%     SpO2 Readings from Last 6 Encounters:   08/15/14 99%   07/21/14 97%          Intake/Output Summary (Last 24 hours) at 08/15/14 0930  Last data filed at 08/15/14 0630   Gross per 24 hour   Intake    725 ml   Output   1400 ml   Net   -675 ml          Exam:     General   well developed, well nourished, appears stated age, in no acute distress  Neck   Supple without nodes or mass. No thyromegaly or bruit  Respiratory   Clear To Auscultation bilaterally - no wheezes, rales, rhonchi, or crackles  Cardiology  Regular Rate and Rythmn  - no murmurs, rubs or gallops  Abdominal  Soft, non-tender, non-distended, positive bowel sounds, no hepatosplenomegaly  Extremities  No clubbing, cyanosis, or edema. Pulses intact.  Neurological  No focal neurological deficits noted  Exam otherwise negative      Telemetry reviewed:   normal sinus rhythm    Current Medications Reviewed    Current facility-administered medications:   ???  HYDROmorphone (PF) (DILAUDID) injection 1 mg, 1 mg, IntraVENous, Q3H PRN, Knox Saliva, MD, 1 mg at 08/15/14 0927  ???  amLODIPine (NORVASC) tablet 10 mg, 10 mg, Oral, DAILY, Knox Saliva, MD  ???  gabapentin (NEURONTIN) capsule 600 mg, 600 mg, Oral, TID, Knox Saliva, MD, 600 mg at 08/15/14 0847  ???  hydrALAZINE (APRESOLINE) tablet 20 mg, 20 mg, Oral, Q8H, Knox Saliva, MD, Stopped at 08/15/14 0600  ???  lisinopril (PRINIVIL, ZESTRIL) tablet 40 mg, 40 mg, Oral, DAILY, Knox Saliva, MD   ???  metoprolol (LOPRESSOR) tablet 50 mg, 50 mg, Oral, BID, Knox Saliva, MD, 50 mg at 08/15/14 0848  ???  naloxone (NARCAN) injection 0.1 mg, 0.1 mg, IntraVENous, PRN, Knox Saliva, MD  ???  0.9% sodium chloride infusion, 100 mL/hr, IntraVENous, CONTINUOUS, Knox Saliva, MD, Stopped at 08/15/14 0600  ???  acetaminophen (TYLENOL) tablet 325 mg, 325 mg, Oral, Q4H PRN **OR** acetaminophen (TYLENOL) solution 325 mg, 325 mg, Oral, Q4H PRN **OR** acetaminophen (TYLENOL) suppository 325 mg, 325 mg, Rectal, Q4H PRN, Knox Saliva, MD  ???  piperacillin-tazobactam (ZOSYN) 3.375 g in 0.9% sodium chloride (MBP/ADV) 100 mL MBP, 3.375 g, IntraVENous, Q8H, Knox Saliva, MD, Last Rate: 25 mL/hr at 08/15/14 0630, 3.375 g at 08/15/14 0630  ???  enoxaparin (LOVENOX) injection 40 mg, 40 mg, SubCUTAneous, Q24H, Knox Saliva, MD, 40 mg at 08/15/14 0631  ???  cloNIDine (CATAPRES) 0.2 mg/24 hr patch 1 Patch,  1 Patch, TransDERmal, Q7D, Knox Saliva, MD, 1 Patch at 08/15/14 0734  ???  [START ON 08/22/2014] remove patch reminder, 1 Patch, Topical, ONCE, Knox Saliva, MD  ???  sodium chloride (NS) 0.9 % flush, , , ,   ???  ondansetron (ZOFRAN) injection 4 mg, 4 mg, IntraVENous, Q4H PRN, Sigmund Hazel Demott, PA-C, 4 mg at 08/15/14 3846  ???  ondansetron (ZOFRAN) injection 4 mg, 4 mg, IntraVENous, Q6H PRN, Lennart Pall, MD  ______________________________________________________________________  Lab Data Reviewed: (see below)    Lab Review:     Recent Results (from the past 24 hour(s))   METABOLIC PANEL, COMPREHENSIVE    Collection Time: 08/14/14  5:04 PM   Result Value Ref Range    Sodium 139 136 - 145 mEq/L    Potassium 3.1 (L) 3.5 - 5.1 mEq/L    Chloride 101 98 - 107 mEq/L    CO2 27 21 - 32 mEq/L    Glucose 165 (H) 74 - 106 mg/dl    BUN 9 7 - 25 mg/dl    Creatinine 0.9 0.6 - 1.3 mg/dl    GFR est AA >60.0      GFR est non-AA >60      Calcium 9.1 8.5 - 10.1 mg/dl    AST 21 15 - 37 U/L    ALT 26 12 - 78 U/L     Alk. phosphatase 93 45 - 117 U/L    Bilirubin, total 0.9 0.2 - 1.0 mg/dl    Protein, total 10.6 (H) 6.4 - 8.2 gm/dl    Albumin 4.2 3.4 - 5.0 gm/dl   LIPASE    Collection Time: 08/14/14  5:04 PM   Result Value Ref Range    Lipase 70 (L) 73 - 393 U/L   CBC WITH AUTOMATED DIFF    Collection Time: 08/14/14  5:04 PM   Result Value Ref Range    WBC 10.0 4.0 - 11.0 1000/mm3    RBC 5.78 (H) 3.60 - 5.20 M/uL    HGB 16.4 13.0 - 17.2 gm/dl    HCT 48.7 37.0 - 50.0 %    MCV 84.3 80.0 - 98.0 fL    MCH 28.4 25.4 - 34.6 pg    MCHC 33.7 30.0 - 36.0 gm/dl    PLATELET 259 140 - 450 1000/mm3    MPV 11.1 (H) 6.0 - 10.0 fL    RDW-SD 42.8 36.4 - 46.3      NRBC 0 0 - 0      IMMATURE GRANULOCYTES 0.3 0.0 - 3.0 %    NEUTROPHILS 86.8 (H) 34 - 64 %    LYMPHOCYTES 11.1 (L) 28 - 48 %    MONOCYTES 1.6 1 - 13 %    EOSINOPHILS 0.0 0 - 5 %    BASOPHILS 0.2 0 - 3 %   POC URINE MACROSCOPIC    Collection Time: 08/14/14  5:42 PM   Result Value Ref Range    Glucose Negative NEGATIVE,Negative mg/dl    Bilirubin Negative NEGATIVE,Negative      Ketone 40 (A) NEGATIVE,Negative mg/dl    Specific gravity 1.020 1.005 - 1.030      Blood Trace-intact (A) NEGATIVE,Negative      pH (UA) 8.5 5 - 9      Protein >=300 (A) NEGATIVE,Negative mg/dl    Urobilinogen 0.2 0.0 - 1.0 EU/dl    Nitrites Negative NEGATIVE,Negative      Leukocyte Esterase Negative NEGATIVE,Negative      Color Yellow  Appearance Clear     GLUCOSE, POC    Collection Time: 08/15/14  1:41 AM   Result Value Ref Range    Glucose (POC) 171 (H) 65 - 465 mg/dL   METABOLIC PANEL, COMPREHENSIVE    Collection Time: 08/15/14  6:29 AM   Result Value Ref Range    Sodium 138 136 - 145 mEq/L    Potassium 2.6 (LL) 3.5 - 5.1 mEq/L    Chloride 101 98 - 107 mEq/L    CO2 27 21 - 32 mEq/L    Glucose 167 (H) 74 - 106 mg/dl    BUN 7 7 - 25 mg/dl    Creatinine 0.7 0.6 - 1.3 mg/dl    GFR est AA >60.0      GFR est non-AA >60      Calcium 8.7 8.5 - 10.1 mg/dl    AST 11 (L) 15 - 37 U/L    ALT 22 12 - 78 U/L     Alk. phosphatase 83 45 - 117 U/L    Bilirubin, total 0.7 0.2 - 1.0 mg/dl    Protein, total 9.6 (H) 6.4 - 8.2 gm/dl    Albumin 3.8 3.4 - 5.0 gm/dl   CBC WITH AUTOMATED DIFF    Collection Time: 08/15/14  6:29 AM   Result Value Ref Range    WBC 17.7 (H) 4.0 - 11.0 1000/mm3    RBC 5.52 (H) 3.60 - 5.20 M/uL    HGB 15.7 13.0 - 17.2 gm/dl    HCT 46.4 37.0 - 50.0 %    MCV 84.1 80.0 - 98.0 fL    MCH 28.4 25.4 - 34.6 pg    MCHC 33.8 30.0 - 36.0 gm/dl    PLATELET 300 140 - 450 1000/mm3    MPV 11.5 (H) 6.0 - 10.0 fL    RDW-SD 43.9 36.4 - 46.3      NRBC 0 0 - 0      IMMATURE GRANULOCYTES 0.5 0.0 - 3.0 %    NEUTROPHILS 82.2 (H) 34 - 64 %    LYMPHOCYTES 10.9 (L) 28 - 48 %    MONOCYTES 6.2 1 - 13 %    EOSINOPHILS 0.0 0 - 5 %    BASOPHILS 0.2 0 - 3 %   PROTHROMBIN TIME + INR    Collection Time: 08/15/14  6:29 AM   Result Value Ref Range    Prothrombin time 14.3 (H) 11.5 - 14.0 seconds    INR 1.1 0.0 - 1.1     PTT    Collection Time: 08/15/14  6:29 AM   Result Value Ref Range    aPTT 34.7 24.9 - 35.6 seconds       Ct Abd Pelv W Cont    08/15/2014   Addendum: There is a 4 mm calcification at the posterior right margin of  the bladder which could indicate a stone although no evidence of ureteral  dilatation seen above this point. Correlation with clinical data needed. This was  present on prior study of 08/10/2014.    08/15/2014   TECHNIQUE:  CT of the abdomen and pelvis WITH intravenous contrast.  The abdomen and pelvis were scanned utilizing a multidetector helical scanner from the diaphragm to the lesser trochanter without the oral administration of barium.  Coronal and sagittal reformations were obtained. Preliminary report by [facility].   COMPARISON: CT abdomen pelvis 08/08/2014  INDICATION: Abdominal pain, diarrhea, vomiting   DISCUSSION:  .  LOWER THORAX: Bullous disease both lung bases with  linear atelectasis versus scarring. Heart normal size.  HEPATOBILIARY: No focal hepatic  lesions.  Gallbladder unremarkable. No biliary ductal dilatation. SPLEEN: No splenomegaly or focal lesion. PANCREAS: No focal masses or ductal dilatation.  ADRENALS: No adrenal nodules. KIDNEYS/URETERS: Bilateral scattered low-density foci measuring 1 cm or less in size suggesting cyst. 8 x 3 mm calcific density right lower pole with 3 mm rounded calcification left lower renal pole with no hydronephrosis or hydroureter on either side. PELVIC ORGANS/BLADDER: Hysterectomy. Bladder unremarkable.  PERITONEUM / RETROPERITONEUM: No free air or fluid. LYMPH NODES: No lymphadenopathy. VESSELS: Unremarkable.  GI TRACT: Extensive diverticulosis of the colon especially in the sigmoid region. Large calcific density that may represent ingested abnormal bone measuring 5.6 cm x 9 mm mid sigmoid and transverse orientation. No evidence of adjacent free air or significant surrounding inflammatory change. No high-grade obstruction. Appendix normal.   BONES AND SOFT TISSUES: Degenerative narrowing both hip joints with sclerosis and subchondral cysts but normal femoral head contours.     08/15/2014   IMPRESSION:  1. Centrilobular emphysema. 2. Bilateral renal lesions suggesting cysts and nonobstructing stones. 3. Hysterectomy. 4. Colonic diverticulosis with apparent ingested animal bone or other ingested material in the mid sigmoid in transverse orientation without frank evidence of perforation. This is little changed from prior study. 5. Degenerative joint disease both hips.       Active Problems:    Foreign body in colon (08/14/2014)           Assessment:     1.?? Abdominal Pain with foreign body in the sigmoid colon  2.?? Accelerated HTN  3.  Hypokalemia.  4.?? Bilateral nephrolithiasis without obstruction.    Plan:     BP is accelerated, SBP >200  Transfer to telemetry unit  Start IV hydralazine, Increase BB, continue clonidine, norvasc  GI consulted, planning flex sigmoidoscopy  Continue IV Zosyn  Replete electrolytes     Total time spent with patient: [address] [place] discussed with: Patient    Discussed:  Care Plan    Prophylaxis:  Lovenox    Disposition:  Home w/Family           ___________________________________________________    Attending Physician: Stanton Kidney, MD

## 2014-08-15 NOTE — H&P (Signed)
[place]  History and Physical  NAME:  [name], [name]  SEX:   F  ADMIT: 08/14/2014  DOB:01-24-1951  MR#    [id number]  ROOM:  [id number]  ACCT#  [id number]    I hereby certify this patient for admission based upon medical necessity as   noted below:    <    DATE OF SERVICE:  08/14/2014.    CHIEF COMPLAINT:  Back pain, abdominal pain.    HISTORY OF PRESENT ILLNESS:  The patient is a 64 year old female with history of hypertension, GERD, and   chronic pain syndrome who presents with complaints of having crampy, diffuse   abdominal pain, worse in the lower abdomen.  The patient has had nausea and   vomiting 6 to 7 times over the past evening and day prior to evaluation.  She   has had 3 episodes of diarrhea.  The patient denies any blood in her stool or   in her vomitus.  She has had chills and she thinks she may have had a   low-grade fever at home.  She denies any shortness of breath or chest pain.    She denies any known sick contacts.  The patient has had a hysterectomy in the   past.  Otherwise no abdominal surgeries.  The [facility] was [facility] and then referred to [facility] ER for further evaluation.  The patient   is status post EGD in 2015 and a colonoscopy in the past.  The patient states   these were completed in [place] and reportedly were normal.  The   patient was seen on 02/26 at [facility].  At that point, she had a CT   of the abdomen and pelvis with contrast which demonstrated diverticulosis   without diverticulitis, bilateral nephrolithiasis and a foreign body   consistent with a bone in the sigmoid colon.  The patient was noted to also   have mild portal hypertension, bilateral renal cyst and nonobstructing   calculi.  The patient was symptomatically improved at the time of discharge.    The patient now returns.  The patient again presents to the emergency room and   complains of having severe abdominal pain and cramping.  Nausea and vomiting    as above.  CT scan of the abdomen has been repeated and on preliminary reading   the foreign body is again noted in the sigmoid colon which is felt to be   consistent with bone.    PAST MEDICAL HISTORY:  Chronic pain, hypertension, GERD, post-traumatic stress disorder.    PAST SURGICAL HISTORY:  Includes GYN surgery.    ALLERGIES:  NO KNOWN DRUG ALLERGIES.    MEDICATIONS:  Amlodipine 10 mg daily, Catapres 0.3 mg 2 times per day, potassium 20 mEq   daily, oxycodone/acetaminophen 10/325 one tablet 2 times daily as needed for   pain, Flexeril 10 mg 3 times daily as needed for muscle spasms, hydralazine 20   mg every 8 hours, Prinivil 40 mg daily, nifedipine ER 60 mg daily, Lopressor   50 mg 2 times daily, Neurontin 600 mg 3 times daily, and Mobic 7.5 mg daily.    FAMILY HISTORY:  Noncontributory.    SOCIAL HISTORY:  Reveals the patient does not smoke, drink alcohol, or use illicit drugs.    REVIEW OF SYSTEMS:  1.  See above.  2.  A 12-point review of systems has been obtained.  ROS is otherwise   noncontributory.  PHYSICAL EXAMINATION:  VITAL SIGNS:  Reveals a blood pressure of 175/109, pulse 120, respirations 22,   temperature 99.1, pulse ox 98%.  HEENT:  NC/AT, PERRLA, EOMI.  No significant adenopathy.  Dry mucous   membranes.  HEENT otherwise unremarkable.  NECK:  Supple and nontender.  No significant adenopathy.  HEART:  RRR with increased rate, regular rhythm.  LUNGS:  Decreased breath sounds at bases, otherwise clear.  ABDOMEN:  Normoactive bowel sounds, soft, mild diffuse discomfort to   palpation, no guarding or rebound.  GENITOURINARY:  Normal female.  EXTREMITIES:  Normal and nontender without edema.  NEUROLOGIC:  Cranial nerves II-XII intact bilaterally.    LABORATORY EVALUATION:  Reveals a white blood count of 10.00, H&H 16.4 and 48.7, platelets 259,000,   86.8 segs, 11.1 lymphs, 1.6 monos.  Sodium is 139, potassium 3.1, chloride   101, CO2 27, BUN 9, creatinine 0.9, glucose 165, ALT is 26, AST is 21, alk    phos is 93.  Lipase is 70.  Urinalysis reveals specific gravity 1.020, pH 8.5,   protein greater than 400, trace blood, all else negative.  CT   Scan -  Preliminary reading reveals a 6 x 1 cm foreign body suggestive of bone   noted in the sigmoid colon.  Final reading is pending per radiologist's   evaluation.  No perforations noted on preliminary reading.    IMPRESSION:  A 64 year old female with history of hypertension, gastroesophageal reflux   disease, and chronic pain syndrome with the following:  1.  Acute foreign body in the sigmoid colon, persistent.  2.  Intractable abdominal pain secondary to above most likely.  3.  Uncontrolled blood pressure with history of hypertension.  4.  Nausea, vomiting, diarrhea, most likely secondary to above.  5.  Hypokalemia.  6.  Bilateral nephrolithiasis without obstruction.  7.  Question of chronic obstructive pulmonary disease noted in lung bases on   the previous CT scan.    PLAN:  The patient will be admitted for inpatient treatment.  She will be given   oxygen if needed to be titrated to effect and supportive care.  The patient   will be placed on GI and DVT prophylaxis.  Electrolytes will be replaced as   needed.  This will include potassium.  The patient will be hydrated with IV   fluids and monitored to avoid fluid overload.  The patient will be given   symptomatic treatment for nausea and vomiting.  Pain medications will be   titrated to effect.  The patient's antihypertensive medications will be   continued in view of her persistent blood pressure elevation.  P.r.n.   antihypertensive medications will be utilized for any persistent elevation of   blood pressure after the patient's pain has been treated as well.  The patient   will be placed on IV antibiotic therapy prophylactically due to concern for   potential for infection due to above with evolving or occult infection already   present, especially in view of patient's left shift and worsening pain in  view of her   persistent foreign body.  A colorectal surgery consult with Dr. Golden Hurter has   been initiated in the Emergency Department, and patient will be seen for   evaluation.  A gastroenterology consult with Dr. Marylene Land has been   initiated in the Emergency Department as well, and patient will be seen for   evaluation.  Home medications will be continued whenever possible.  Further   recommendations will  be based on test results, response to treatment and   clinical course.      ___________________  Knox Saliva MD  Dictated By: .   [place]  D:08/15/2014 05:24:04  T: 08/15/2014 06:00:56  [id number]

## 2014-08-15 NOTE — Other (Signed)
Bedside and Verbal shift change report given to Lawson FiscalLori RN (oncoming nurse) by Bennetta LaosStephen RN (offgoing nurse). Report included the following information SBAR, Kardex, MAR, Recent Results and Cardiac Rhythm ST.

## 2014-08-15 NOTE — ED Notes (Signed)
Pt medicated for high bp.  Will transfer to floor as soon as possible.

## 2014-08-16 ENCOUNTER — Inpatient Hospital Stay: Payer: MEDICARE | Attending: Internal Medicine | Primary: Physician Assistant

## 2014-08-16 LAB — MAGNESIUM: Magnesium: 1.7 mg/dl — ABNORMAL LOW (ref 1.8–2.4)

## 2014-08-16 LAB — CBC WITH AUTOMATED DIFF
BASOPHILS: 0.5 % (ref 0–3)
EOSINOPHILS: 0.4 % (ref 0–5)
HCT: 42.6 % (ref 37.0–50.0)
HGB: 14 gm/dl (ref 13.0–17.2)
IMMATURE GRANULOCYTES: 0.3 % (ref 0.0–3.0)
LYMPHOCYTES: 33.4 % (ref 28–48)
MCH: 28.8 pg (ref 25.4–34.6)
MCHC: 32.9 gm/dl (ref 30.0–36.0)
MCV: 87.7 fL (ref 80.0–98.0)
MONOCYTES: 8.3 % (ref 1–13)
MPV: 11.5 fL — ABNORMAL HIGH (ref 6.0–10.0)
NEUTROPHILS: 57.1 % (ref 34–64)
NRBC: 0 (ref 0–0)
PLATELET: 214 10*3/uL (ref 140–450)
RBC: 4.86 M/uL (ref 3.60–5.20)
RDW-SD: 46.2 (ref 36.4–46.3)
WBC: 10.6 10*3/uL (ref 4.0–11.0)

## 2014-08-16 LAB — METABOLIC PANEL, BASIC
BUN: 15 mg/dl (ref 7–25)
CO2: 28 mEq/L (ref 21–32)
Calcium: 8.5 mg/dl (ref 8.5–10.1)
Chloride: 106 mEq/L (ref 98–107)
Creatinine: 0.9 mg/dl (ref 0.6–1.3)
GFR est AA: >60
GFR est non-AA: >60
Glucose: 105 mg/dl (ref 74–106)
Potassium: 2.9 mEq/L — CL (ref 3.5–5.1)
Sodium: 142 mEq/L (ref 136–145)

## 2014-08-16 MED ORDER — WHITE PETROLATUM-MINERAL OIL 56.8 %-42.5 % EYE OINTMENT
OPHTHALMIC | Status: DC | PRN
Start: 2014-08-16 — End: 2014-08-16

## 2014-08-16 MED ORDER — DIPHENHYDRAMINE HCL 50 MG/ML IJ SOLN
50 mg/mL | Freq: Once | INTRAMUSCULAR | Status: DC | PRN
Start: 2014-08-16 — End: 2014-08-16

## 2014-08-16 MED ORDER — PROPOFOL 10 MG/ML IV EMUL
10 mg/mL | INTRAVENOUS | Status: DC | PRN
Start: 2014-08-16 — End: 2014-08-16
  Administered 2014-08-16: 16:00:00 via INTRAVENOUS

## 2014-08-16 MED ORDER — PROPOFOL 10 MG/ML IV EMUL
10 mg/mL | INTRAVENOUS | Status: AC
Start: 2014-08-16 — End: 2014-08-16
  Administered 2014-08-16: 16:00:00

## 2014-08-16 MED ORDER — SODIUM CHLORIDE 0.9 % IV
INTRAVENOUS | Status: DC | PRN
Start: 2014-08-16 — End: 2014-08-16
  Administered 2014-08-16: 16:00:00 via INTRAVENOUS

## 2014-08-16 MED ORDER — ONDANSETRON (PF) 4 MG/2 ML INJECTION
4 mg/2 mL | Freq: Once | INTRAMUSCULAR | Status: DC | PRN
Start: 2014-08-16 — End: 2014-08-16

## 2014-08-16 MED ORDER — KETAMINE 50 MG/ML (1 ML) INTRAVENOUS SYRINGE
50 mg/mL (1 mL) | INTRAVENOUS | Status: AC
Start: 2014-08-16 — End: ?

## 2014-08-16 MED ORDER — FLUMAZENIL 0.1 MG/ML IV SOLN
0.1 mg/mL | INTRAVENOUS | Status: DC | PRN
Start: 2014-08-16 — End: 2014-08-16

## 2014-08-16 MED ORDER — KETAMINE 50 MG/ML IJ SOLN
50 mg/mL | INTRAMUSCULAR | Status: DC | PRN
Start: 2014-08-16 — End: 2014-08-16
  Administered 2014-08-16: 16:00:00 via INTRAVENOUS

## 2014-08-16 MED ORDER — HALOPERIDOL LACTATE 5 MG/ML IJ SOLN
5 mg/mL | Freq: Once | INTRAMUSCULAR | Status: DC | PRN
Start: 2014-08-16 — End: 2014-08-16

## 2014-08-16 MED ORDER — NALOXONE 0.4 MG/ML INJECTION
0.4 mg/mL | INTRAMUSCULAR | Status: DC | PRN
Start: 2014-08-16 — End: 2014-08-16

## 2014-08-16 MED FILL — PIPERACILLIN-TAZOBACTAM 3.375 GRAM IV SOLR: 3.375 gram | INTRAVENOUS | Qty: 3.38

## 2014-08-16 MED FILL — HYDROMORPHONE (PF) 1 MG/ML IJ SOLN: 1 mg/mL | INTRAMUSCULAR | Qty: 1

## 2014-08-16 MED FILL — SODIUM CHLORIDE 0.9 % IV PIGGY BACK: INTRAVENOUS | Qty: 100

## 2014-08-16 MED FILL — GABAPENTIN 300 MG CAP: 300 mg | ORAL | Qty: 2

## 2014-08-16 MED FILL — AMLODIPINE 5 MG TAB: 5 mg | ORAL | Qty: 2

## 2014-08-16 MED FILL — DIPRIVAN 10 MG/ML INTRAVENOUS EMULSION: 10 mg/mL | INTRAVENOUS | Qty: 100

## 2014-08-16 MED FILL — LISINOPRIL 20 MG TAB: 20 mg | ORAL | Qty: 2

## 2014-08-16 MED FILL — POTASSIUM CHLORIDE 10 MEQ/100 ML IV PIGGY BACK: 10 mEq/0 mL | INTRAVENOUS | Qty: 100

## 2014-08-16 MED FILL — METOPROLOL TARTRATE 100 MG TAB: 100 mg | ORAL | Qty: 1

## 2014-08-16 MED FILL — KETAMINE 50 MG/ML (1 ML) INTRAVENOUS SYRINGE: 50 mg/mL (1 mL) | INTRAVENOUS | Qty: 1

## 2014-08-16 MED FILL — HYDRALAZINE 20 MG/ML IJ SOLN: 20 mg/mL | INTRAMUSCULAR | Qty: 1

## 2014-08-16 NOTE — Anesthesia Post-Procedure Evaluation (Signed)
Post-Anesthesia Evaluation and Assessment    Patient: [name] [name] MRN: [id number]  SSN: [id number]    Date of Birth: 03/01/1951  Age: 64 y.o.  Sex: female       Cardiovascular Function/Vital Signs  Visit Vitals   Item Reading   ??? BP 169/111 mmHg   ??? Pulse 79   ??? Temp 36.8 ??C (98.3 ??F)   ??? Resp 20   ??? Ht 5\' 7"  (1.702 m)   ??? Wt 78.5 kg (173 lb 1 oz)   ??? BMI 27.10 kg/m2   ??? SpO2 99%       Patient is status post general, total IV anesthesia, MAC anesthesia for Procedure(s):  SIGMOIDOSCOPY FLEXIBLE with removal of foreign body.    Nausea/Vomiting: None    Postoperative hydration reviewed and adequate.    Pain:  Pain Scale 1: Numeric (0 - 10) (08/16/14 1237)  Pain Intensity 1: 3 (08/16/14 1237)   Managed    Neurological Status:   Neuro (WDL): Within Defined Limits (08/16/14 1130)   At baseline    Mental Status and Level of Consciousness: Alert and oriented     Pulmonary Status:   O2 Device: Nasal cannula (08/16/14 1237)   Adequate oxygenation and airway patent    Complications related to anesthesia: None    Post-anesthesia assessment completed. No concerns      Signed By: Lubertha BasqueScott D Adabella Stanis, DO     August 16, 2014

## 2014-08-16 NOTE — Other (Signed)
Bedside and Verbal shift change report given to Kim C LPN (oncoming nurse) by Lori King RN (offgoing nurse). Report included the following information SBAR, Kardex, MAR and Recent Results.

## 2014-08-16 NOTE — Other (Signed)
Dr. Buford Dresserpennington in, aware of pts bp, pt states pain easing up now. States to give hydralazine as ordered on floor.

## 2014-08-16 NOTE — Anesthesia Pre-Procedure Evaluation (Signed)
Anesthetic History   No history of anesthetic complications            Review of Systems / Medical History  Patient summary reviewed, nursing notes reviewed and pertinent labs reviewed    Pulmonary          Undiagnosed apnea         Neuro/Psych   Within defined limits           Cardiovascular    Hypertension: poorly controlled              Exercise tolerance: >4 METS     GI/Hepatic/Renal     GERD: well controlled           Endo/Other  Within defined limits           Other Findings              Physical Exam    Airway  Mallampati: II  TM Distance: 4 - 6 cm  Neck ROM: normal range of motion   Mouth opening: Normal     Cardiovascular  Regular rate and rhythm,  S1 and S2 normal,  no murmur, click, rub, or gallop             Dental    Dentition: Edentulous     Pulmonary  Breath sounds clear to auscultation               Abdominal  GI exam deferred       Other Findings            Anesthetic Plan    ASA: 3  Anesthesia type: general and total IV anesthesia          Induction: Intravenous  Anesthetic plan and risks discussed with: Patient

## 2014-08-16 NOTE — Progress Notes (Signed)
Medical Progress Note      NAME: [name] [name]    DOB:  03/27/1951  MRM:           [id number]    Date/Time: 08/16/2014  4:18 PM      Assessment:     1.?? Abdominal Pain with foreign body in the sigmoid colon  2.?? Accelerated HTN  3.?? Hypokalemia.  4.?? Bilateral nephrolithiasis without obstruction.    Plan:     For Flex sigmoidoscopy today  Continue IV hydralazine, BB, clonidine, norvasc  Continue IV Zosyn  Replete electrolytes  Monitor cbc, BMP    Subjective:     Abdominal pain, no nausea or vomiting    Objective:       Vitals:      Last 24hrs VS reviewed since prior progress note. Most recent are:    Visit Vitals   Item Reading   ??? BP 170/105 mmHg   ??? Pulse 72   ??? Temp 99 ??F (37.2 ??C)   ??? Resp 18   ??? Ht 5\' 7"  (1.702 m)   ??? Wt 78.5 kg (173 lb 1 oz)   ??? BMI 27.10 kg/m2   ??? SpO2 99%     SpO2 Readings from Last 6 Encounters:   08/16/14 99%   07/21/14 97%    O2 Flow Rate (L/min): 2 l/min     Intake/Output Summary (Last 24 hours) at 08/16/14 1618  Last data filed at 08/16/14 1344   Gross per 24 hour   Intake 2221.25 ml   Output   1550 ml   Net 671.25 ml          Exam:     General:   well developed, well nourished, in no acute distress  HEENT: PERRLA, Un icteric Conjunctiva. Neck Supple,  No JVD  Respiratory:   CTA bilaterally-no wheezes, rales, rhonchi, or crackles  Cardiac:  Regular Rate and Rythmn  - no murmurs, rubs or gallops  Abdominal:  Mild tenderness, non-distended, positive bowel sounds  Extremities:  No cyanosis, or edema.  Skin: No rash  Neurological  No focal neurological deficits noted    Medication:     Current Medications Reviewed    Current facility-administered medications:   ???  amLODIPine (NORVASC) tablet 10 mg, 10 mg, Oral, DAILY, Otilio CarpenAnthony A [name], MD, 10 mg at 08/16/14 1333  ???  gabapentin (NEURONTIN) capsule 600 mg, 600 mg, Oral, TID, Otilio CarpenAnthony A [name], MD, 600 mg at 08/16/14 1602  ???  lisinopril (PRINIVIL, ZESTRIL) tablet 40 mg, 40 mg, Oral, DAILY, Otilio CarpenAnthony A [name], MD, 40 mg at 08/16/14 1333   ???  naloxone (NARCAN) injection 0.1 mg, 0.1 mg, IntraVENous, PRN, Otilio CarpenAnthony A [name], MD  ???  acetaminophen (TYLENOL) tablet 325 mg, 325 mg, Oral, Q4H PRN **OR** acetaminophen (TYLENOL) solution 325 mg, 325 mg, Oral, Q4H PRN **OR** acetaminophen (TYLENOL) suppository 325 mg, 325 mg, Rectal, Q4H PRN, Otilio CarpenAnthony A [name], MD  ???  piperacillin-tazobactam (ZOSYN) 3.375 g in 0.9% sodium chloride (MBP/ADV) 100 mL MBP, 3.375 g, IntraVENous, Q8H, Otilio CarpenAnthony A [name], MD, Last Rate: 25 mL/hr at 08/16/14 1601, 3.375 g at 08/16/14 1601  ???  cloNIDine (CATAPRES) 0.2 mg/24 hr patch 1 Patch, 1 Patch, TransDERmal, Q7D, Otilio CarpenAnthony A [name], MD, 1 Patch at 08/15/14 0734  ???  [START ON 08/22/2014] remove patch reminder, 1 Patch, Topical, ONCE, Otilio CarpenAnthony A [name], MD  ???  0.9% sodium chloride infusion, 75 mL/hr, IntraVENous, CONTINUOUS, Theodis AguasYared G Guadalupe Kerekes, MD, Last Rate: 75 mL/hr at 08/16/14 1214, 75  mL/hr at 08/16/14 1214  ???  HYDROmorphone (PF) (DILAUDID) injection 1 mg, 1 mg, IntraVENous, Q2H PRN, Theodis Aguas, MD, 1 mg at 08/16/14 1601  ???  metoprolol (LOPRESSOR) tablet 100 mg, 100 mg, Oral, BID, Theodis Aguas, MD, 100 mg at 08/16/14 1333  ???  hydrALAZINE (APRESOLINE) 20 mg/mL injection 20 mg, 20 mg, IntraVENous, Q6H PRN, Theodis Aguas, MD, Stopped at 08/16/14 1612  ???  LORazepam (ATIVAN) 2 mg/mL injection 0.5 mg, 0.5 mg, IntraVENous, Q4H PRN, Theodis Aguas, MD  ???  [START ON 08/17/2014] enoxaparin (LOVENOX) injection 40 mg, 40 mg, SubCUTAneous, Q24H, Chantel D Dixon, NP  ???  ondansetron (ZOFRAN) injection 4 mg, 4 mg, IntraVENous, Q4H PRN, Kara M Demott, PA-C, 4 mg at 08/15/14 4782    Lab:     Lab Data Reviewed: (see below)    Recent Results (from the past 24 hour(s))   CBC WITH AUTOMATED DIFF    Collection Time: 08/16/14  7:01 AM   Result Value Ref Range    WBC 10.6 4.0 - 11.0 1000/mm3    RBC 4.86 3.60 - 5.20 M/uL    HGB 14.0 13.0 - 17.2 gm/dl    HCT 95.6 21.3 - 08.6 %    MCV 87.7 80.0 - 98.0 fL    MCH 28.8 25.4 - 34.6 pg    MCHC 32.9 30.0 - 36.0 gm/dl     PLATELET 578 469 - 450 1000/mm3    MPV 11.5 (H) 6.0 - 10.0 fL    RDW-SD 46.2 36.4 - 46.3      NRBC 0 0 - 0      IMMATURE GRANULOCYTES 0.3 0.0 - 3.0 %    NEUTROPHILS 57.1 34 - 64 %    LYMPHOCYTES 33.4 28 - 48 %    MONOCYTES 8.3 1 - 13 %    EOSINOPHILS 0.4 0 - 5 %    BASOPHILS 0.5 0 - 3 %   METABOLIC PANEL, BASIC    Collection Time: 08/16/14  7:01 AM   Result Value Ref Range    Sodium 142 136 - 145 mEq/L    Potassium 2.9 (LL) 3.5 - 5.1 mEq/L    Chloride 106 98 - 107 mEq/L    CO2 28 21 - 32 mEq/L    Glucose 105 74 - 106 mg/dl    BUN 15 7 - 25 mg/dl    Creatinine 0.9 0.6 - 1.3 mg/dl    GFR est AA >62.9      GFR est non-AA >60      Calcium 8.5 8.5 - 10.1 mg/dl   MAGNESIUM    Collection Time: 08/16/14  7:01 AM   Result Value Ref Range    Magnesium 1.7 (L) 1.8 - 2.4 mg/dl       No results found.    Active Problems:    Foreign body in colon (08/14/2014)          Total time spent with patient: 35 Minutes                  Care Plan discussed with: Patient    Discussed:  Care Plan    Disposition:  Home w/Family           ___________________________________________________    Attending Physician: Theodis Aguas, MD      [facility]

## 2014-08-16 NOTE — Other (Signed)
Bedside and Verbal shift change report given to Elmyra RicksKim, S. RN (oncoming nurse) by Marinell BlightKim, C. LPN (offgoing nurse). Report included the following information SBAR and Kardex.

## 2014-08-16 NOTE — Other (Signed)
Anne ShutterMonica R Stepp  received from [address] for ordered procedure      Report consisted of patient???s Situation, Background, Assessment and   Recommendations(SBAR).     Information from the following report(s) Kardex and MAR was reviewed with the receiving nurse.    Opportunity for questions and clarification was provided.      Assessment completed upon patient???s arrival to unit and care assumed.

## 2014-08-16 NOTE — Other (Addendum)
TRANSFER - IN REPORT:    Verbal report received from KIM CURRY RN(name) on [name] [name]  being received from [facility](unit) for routine post - op      Report consisted of patient???s Situation, Background, Assessment and   Recommendations(SBAR).     Information from the following report(s) Procedure Summary, Intake/Output, MAR and Recent Results was reviewed with the receiving nurse.    Opportunity for questions and clarification was provided.      Assessment completed upon patient???s arrival to unit and care assumed.

## 2014-08-16 NOTE — Other (Signed)
SPOKE TO DR. PENNINGTON CONCERNING PTS PER PROCEDURE BP AND BP ON FLOOR SINCE ADMISSION. STATES OK TO SEND BACK TO HER ROOM AND NOTIFY NURSE TO GIVE MISSED BP MEDS.

## 2014-08-16 NOTE — Other (Signed)
Spoke to dr. Buford Dresserpennington concerning pts abd pain and bp. States ok to give dilaudid as ordered on floor.

## 2014-08-17 ENCOUNTER — Inpatient Hospital Stay: Admit: 2014-08-17 | Payer: MEDICARE | Attending: Internal Medicine | Primary: Physician Assistant

## 2014-08-17 LAB — CBC WITH AUTOMATED DIFF
BASOPHILS: 0.6 % (ref 0–3)
EOSINOPHILS: 1.6 % (ref 0–5)
HCT: 40.2 % (ref 37.0–50.0)
HGB: 13.4 gm/dl (ref 13.0–17.2)
IMMATURE GRANULOCYTES: 0.2 % (ref 0.0–3.0)
LYMPHOCYTES: 36.8 % (ref 28–48)
MCH: 29.2 pg (ref 25.4–34.6)
MCHC: 33.3 gm/dl (ref 30.0–36.0)
MCV: 87.6 fL (ref 80.0–98.0)
MONOCYTES: 8.9 % (ref 1–13)
MPV: 11.9 fL — ABNORMAL HIGH (ref 6.0–10.0)
NEUTROPHILS: 51.9 % (ref 34–64)
NRBC: 0 (ref 0–0)
PLATELET: 203 10*3/uL (ref 140–450)
RBC: 4.59 M/uL (ref 3.60–5.20)
RDW-SD: 44.6 (ref 36.4–46.3)
WBC: 10.5 10*3/uL (ref 4.0–11.0)

## 2014-08-17 LAB — METABOLIC PANEL, BASIC
BUN: 8 mg/dl (ref 7–25)
CO2: 29 mEq/L (ref 21–32)
Calcium: 8.1 mg/dl — ABNORMAL LOW (ref 8.5–10.1)
Chloride: 104 mEq/L (ref 98–107)
Creatinine: 0.6 mg/dl (ref 0.6–1.3)
GFR est AA: >60
GFR est non-AA: >60
Glucose: 116 mg/dl — ABNORMAL HIGH (ref 74–106)
Potassium: 2.8 mEq/L — CL (ref 3.5–5.1)
Sodium: 141 mEq/L (ref 136–145)

## 2014-08-17 MED ORDER — HYDRALAZINE 50 MG TAB
50 mg | Freq: Once | ORAL | Status: AC
Start: 2014-08-17 — End: 2014-08-17
  Administered 2014-08-17: 22:00:00 via ORAL

## 2014-08-17 MED ORDER — POTASSIUM CHLORIDE 20 MEQ/100 ML IV PIGGY BACK
20 mEq/100 mL | Freq: Once | INTRAVENOUS | Status: DC
Start: 2014-08-17 — End: 2014-08-17

## 2014-08-17 MED ORDER — POTASSIUM CHLORIDE SR 20 MEQ TAB, PARTICLES/CRYSTALS
20 mEq | ORAL | Status: AC
Start: 2014-08-17 — End: 2014-08-17
  Administered 2014-08-17: 14:00:00 via ORAL

## 2014-08-17 MED ORDER — POTASSIUM CHLORIDE 10 MEQ/100 ML IV PIGGY BACK
10 mEq/0 mL | INTRAVENOUS | Status: AC
Start: 2014-08-17 — End: 2014-08-17
  Administered 2014-08-17 (×2): via INTRAVENOUS

## 2014-08-17 MED ORDER — HYDRALAZINE 50 MG TAB
50 mg | Freq: Two times a day (BID) | ORAL | Status: DC
Start: 2014-08-17 — End: 2014-08-18
  Administered 2014-08-18 (×2): via ORAL

## 2014-08-17 MED FILL — LISINOPRIL 20 MG TAB: 20 mg | ORAL | Qty: 2

## 2014-08-17 MED FILL — PIPERACILLIN-TAZOBACTAM 3.375 GRAM IV SOLR: 3.375 gram | INTRAVENOUS | Qty: 3.38

## 2014-08-17 MED FILL — HYDROMORPHONE (PF) 1 MG/ML IJ SOLN: 1 mg/mL | INTRAMUSCULAR | Qty: 1

## 2014-08-17 MED FILL — GABAPENTIN 300 MG CAP: 300 mg | ORAL | Qty: 2

## 2014-08-17 MED FILL — SODIUM CHLORIDE 0.9 % IV PIGGY BACK: INTRAVENOUS | Qty: 100

## 2014-08-17 MED FILL — POTASSIUM CHLORIDE 10 MEQ/100 ML IV PIGGY BACK: 10 mEq/0 mL | INTRAVENOUS | Qty: 100

## 2014-08-17 MED FILL — HYDRALAZINE 50 MG TAB: 50 mg | ORAL | Qty: 1

## 2014-08-17 MED FILL — LOVENOX 40 MG/0.4 ML SUBCUTANEOUS SYRINGE: 40 mg/0.4 mL | SUBCUTANEOUS | Qty: 0.4

## 2014-08-17 MED FILL — METOPROLOL TARTRATE 100 MG TAB: 100 mg | ORAL | Qty: 1

## 2014-08-17 MED FILL — KLOR-CON M20 MEQ TABLET,EXTENDED RELEASE: 20 mEq | ORAL | Qty: 1

## 2014-08-17 MED FILL — AMLODIPINE 5 MG TAB: 5 mg | ORAL | Qty: 2

## 2014-08-17 NOTE — Progress Notes (Signed)
Problem: Pain  Goal: *Control of Pain  Outcome: Progressing Towards Goal  Patient complained of pain to back and leg. 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 Medication given as ordered for pain relief. Reassessment patient was asleep and resting quietly. Up ad lib without any difficulty to restroom. Denies shortness of breath and chest pain. Continue to monitor.

## 2014-08-17 NOTE — Other (Signed)
Dr. Earlie LouJasarevic notified that patient would like sleeping pill. No new orders at this time.

## 2014-08-17 NOTE — Progress Notes (Signed)
Medical Progress Note      NAME: [name] [name]    DOB:  [birth year]  MRM:           [id number]    Date/Time: 08/17/2014  3:12 PM      Assessment:     1.?? Abdominal Pain with foreign body in the sigmoid colon with a large chicken bone obstructed in her sigmoid colon.??   S/p endoscopic removal  2.?? Accelerated HTN  3.?? Hypokalemia.  4.?? Bilateral nephrolithiasis without obstruction.    Plan:     For Flex sigmoidoscopy today  Continue IV hydralazine, BB, clonidine, norvasc  Continue IV Zosyn  Replete electrolytes  Monitor cbc, BMP    Subjective:     Abdominal pain is better, no nausea or vomiting    Objective:       Vitals:      Last 24hrs VS reviewed since prior progress note. Most recent are:    Visit Vitals   Item Reading   ??? BP 154/90 mmHg   ??? Pulse 73   ??? Temp 99.1 ??F (37.3 ??C)   ??? Resp 18   ??? Ht  (1.702 m)   ??? Wt 78.5 kg (173 lb 1 oz)   ??? BMI 27.10 kg/m2   ??? SpO2 95%     SpO2 Readings from Last 6 Encounters:   08/17/14 95%   07/21/14 97%    O2 Flow Rate (L/min): 2 l/min       Intake/Output Summary (Last 24 hours) at 08/17/14 1517  Last data filed at 08/17/14 1227   Gross per 24 hour   Intake   1595 ml   Output      0 ml   Net   1595 ml          Exam:     General:   well developed, well nourished, in no acute distress  HEENT: PERRLA, Un icteric Conjunctiva. Neck Supple,  No JVD  Respiratory:   CTA bilaterally-no wheezes, rales, rhonchi, or crackles  Cardiac:  Regular Rate and Rythmn  - no murmurs, rubs or gallops  Abdominal:  Mild tenderness, non-distended, positive bowel sounds  Extremities:  No cyanosis, or edema.  Skin: No rash  Neurological  No focal neurological deficits noted    Medication:     Current Medications Reviewed    Current facility-administered medications:   ???  amLODIPine (NORVASC) tablet 10 mg, 10 mg, Oral, DAILY, Otilio Carpen, MD, 10 mg at 08/17/14 0919  ???  gabapentin (NEURONTIN) capsule 600 mg, 600 mg, Oral, TID, Otilio Carpen, MD, 600 mg at 08/17/14 0919   ???  lisinopril (PRINIVIL, ZESTRIL) tablet 40 mg, 40 mg, Oral, DAILY, Otilio Carpen, MD, 40 mg at 08/17/14 0454  ???  naloxone (NARCAN) injection 0.1 mg, 0.1 mg, IntraVENous, PRN, Otilio Carpen, MD  ???  acetaminophen (TYLENOL) tablet 325 mg, 325 mg, Oral, Q4H PRN **OR** acetaminophen (TYLENOL) solution 325 mg, 325 mg, Oral, Q4H PRN **OR** acetaminophen (TYLENOL) suppository 325 mg, 325 mg, Rectal, Q4H PRN, Otilio Carpen, MD  ???  piperacillin-tazobactam (ZOSYN) 3.375 g in 0.9% sodium chloride (MBP/ADV) 100 mL MBP, 3.375 g, IntraVENous, Q8H, Otilio Carpen, MD, Last Rate: 25 mL/hr at 08/17/14 1434, 3.375 g at 08/17/14 1434  ???  cloNIDine (CATAPRES) 0.2 mg/24 hr patch 1 Patch, 1 Patch, TransDERmal, Q7D, Otilio Carpen, MD, 1 Patch at 08/15/14 0734  ???  [START ON 08/22/2014] remove patch reminder, 1 Patch, Topical, ONCE, Ethelene Browns  A [name], MD  ???  0.9% sodium chloride infusion, 75 mL/hr, IntraVENous, CONTINUOUS, Theodis AguasYared G Hoorain Kozakiewicz, MD, Last Rate: 75 mL/hr at 08/16/14 2208, 75 mL/hr at 08/16/14 2208  ???  HYDROmorphone (PF) (DILAUDID) injection 1 mg, 1 mg, IntraVENous, Q2H PRN, Theodis AguasYared G Hulda Reddix, MD, 1 mg at 08/17/14 1430  ???  metoprolol (LOPRESSOR) tablet 100 mg, 100 mg, Oral, BID, Theodis AguasYared G Alizon Schmeling, MD, 100 mg at 08/17/14 [id number]  ???  hydrALAZINE (APRESOLINE) 20 mg/mL injection 20 mg, 20 mg, IntraVENous, Q6H PRN, Theodis AguasYared G Iyana Topor, MD, 20 mg at 08/17/14 1141  ???  LORazepam (ATIVAN) 2 mg/mL injection 0.5 mg, 0.5 mg, IntraVENous, Q4H PRN, Theodis AguasYared G Mychelle Kendra, MD  ???  enoxaparin (LOVENOX) injection 40 mg, 40 mg, SubCUTAneous, Q24H, Chantel D Dixon, NP, 40 mg at 08/17/14 0531  ???  ondansetron (ZOFRAN) injection 4 mg, 4 mg, IntraVENous, Q4H PRN, Kara M Demott, PA-C, 4 mg at 08/15/14 [id number]    Lab:     Lab Data Reviewed: (see below)    Recent Results (from the past 24 hour(s))   CBC WITH AUTOMATED DIFF    Collection Time: 08/17/14  6:01 AM   Result Value Ref Range    WBC 10.5 4.0 - 11.0 1000/mm3    RBC 4.59 3.60 - 5.20 M/uL     HGB 13.4 13.0 - 17.2 gm/dl    HCT 54.040.2 98.137.0 - 19.150.0 %    MCV 87.6 80.0 - 98.0 fL    MCH 29.2 25.4 - 34.6 pg    MCHC 33.3 30.0 - 36.0 gm/dl    PLATELET 478203 295140 - 621450 1000/mm3    MPV 11.9 (H) 6.0 - 10.0 fL    RDW-SD 44.6 36.4 - 46.3      NRBC 0 0 - 0      IMMATURE GRANULOCYTES 0.2 0.0 - 3.0 %    NEUTROPHILS 51.9 34 - 64 %    LYMPHOCYTES 36.8 28 - 48 %    MONOCYTES 8.9 1 - 13 %    EOSINOPHILS 1.6 0 - 5 %    BASOPHILS 0.6 0 - 3 %   METABOLIC PANEL, BASIC    Collection Time: 08/17/14  6:01 AM   Result Value Ref Range    Sodium 141 136 - 145 mEq/L    Potassium 2.8 (LL) 3.5 - 5.1 mEq/L    Chloride 104 98 - 107 mEq/L    CO2 29 21 - 32 mEq/L    Glucose 116 (H) 74 - 106 mg/dl    BUN 8 7 - 25 mg/dl    Creatinine 0.6 0.6 - 1.3 mg/dl    GFR est AA >30.8>60.0      GFR est non-AA >60      Calcium 8.1 (L) 8.5 - 10.1 mg/dl       No results found.    Active Problems:    Foreign body in colon (08/14/2014)          Total time spent with patient: 40 Minutes                  Care Plan discussed with: Patient    Discussed:  Care Plan    Disposition:  Home w/Family           ___________________________________________________    Attending Physician: Theodis AguasYared G Hazelene Doten, MD      [facility]

## 2014-08-17 NOTE — Progress Notes (Signed)
Gastrointestinal Progress Note    Patient Name: [name] [name]    ZOXWR'U Date: 08/17/2014    Admit Date: 08/14/2014    Subjective:     [name] [name] is a 64 y.o. Female who presented with abdominal pain and found on ct scan to have a foreign body in the sigmoid colon.  Sigmoidoscopy noted a large chicken bone causing ulceration in the wall of the sigmoid colon.  Bone was removed endoscopically and no obvious perforation noted.  KUB noted no free air.   She has no complaints today.            Current Facility-Administered Medications   Medication Dose Route Frequency   ??? amLODIPine (NORVASC) tablet 10 mg  10 mg Oral DAILY   ??? gabapentin (NEURONTIN) capsule 600 mg  600 mg Oral TID   ??? lisinopril (PRINIVIL, ZESTRIL) tablet 40 mg  40 mg Oral DAILY   ??? naloxone (NARCAN) injection 0.1 mg  0.1 mg IntraVENous PRN   ??? acetaminophen (TYLENOL) tablet 325 mg  325 mg Oral Q4H PRN    Or   ??? acetaminophen (TYLENOL) solution 325 mg  325 mg Oral Q4H PRN    Or   ??? acetaminophen (TYLENOL) suppository 325 mg  325 mg Rectal Q4H PRN   ??? piperacillin-tazobactam (ZOSYN) 3.375 g in 0.9% sodium chloride (MBP/ADV) 100 mL MBP  3.375 g IntraVENous Q8H   ??? cloNIDine (CATAPRES) 0.2 mg/24 hr patch 1 Patch  1 Patch TransDERmal Q7D   ??? [START ON 08/22/2014] remove patch reminder  1 Patch Topical ONCE   ??? 0.9% sodium chloride infusion  75 mL/hr IntraVENous CONTINUOUS   ??? HYDROmorphone (PF) (DILAUDID) injection 1 mg  1 mg IntraVENous Q2H PRN   ??? metoprolol (LOPRESSOR) tablet 100 mg  100 mg Oral BID   ??? hydrALAZINE (APRESOLINE) 20 mg/mL injection 20 mg  20 mg IntraVENous Q6H PRN   ??? LORazepam (ATIVAN) 2 mg/mL injection 0.5 mg  0.5 mg IntraVENous Q4H PRN   ??? enoxaparin (LOVENOX) injection 40 mg  40 mg SubCUTAneous Q24H   ??? ondansetron (ZOFRAN) injection 4 mg  4 mg IntraVENous Q4H PRN          Objective:     Physical Exam:    BP 174/115 mmHg   Pulse 77   Temp(Src) 98.2 ??F (36.8 ??C)   Resp 24   Ht 5'  7" (1.702 m)   Wt 78.5 kg (173 lb 1 oz)   BMI 27.10 kg/m2   SpO2 98%  General appearance: alert, cooperative, no distress, appears stated age  Abdomen: soft, non-tender. Bowel sounds normal. No masses,  no organomegaly    Data Review:    Labs: Results:       Chemistry Recent Labs      08/17/14   0601  08/16/14   0701  08/15/14   0629  08/14/14   1704   GLU  116*  105  167*  165* NA  141  142  138  139   K  2.8*  2.9*  2.6*  3.1*   CL  104  106  101  101   CO2  BUN  CREA  0.6  0.9  0.7  0.9   CA  8.1*  8.5  8.7  9.1   AP   --    --   83  93  TP   --    --   9.6*  10.6*   ALB   --    --   3.8  4.2      CBC w/Diff Recent Labs      08/17/14   0601  08/16/14   0701  08/15/14   0629   WBC  10.5  10.6  17.7*   RBC  4.59  4.86  5.52*   HGB  13.4  14.0  15.7   HCT  40.2  42.6  46.4   PLT  203  214  300   GRANS  51.9  57.1  82.2*   LYMPH  36.8  33.4  10.9*   EOS  1.6  0.4  0.0      Coagulation Recent Labs      08/15/14   0629   PTP  14.3*   INR  1.1   APTT  34.7       Liver Enzymes Recent Labs      08/15/14   0629   TP  9.6*   ALB  3.8   AP  83   SGOT  11*   ALT  22          Assessment:   1. Sigmoid colon foreign body with ulceration:  She had a large chicken bone obstructed in her sigmoid colon.  Able to remove endoscopically.  No perforation on sigmoidoscopy or on kub.  She has no complaint today.  Will advance diet.  Can consider for discharge from GI standpoint.  Will sign off.      Recommendation:   1. Advance diet.   2. Can consider for discharge, will sign off.  Please call with any questions.         Juliane LackBRIAN M Siddarth Hsiung, MD  August 17, 2014

## 2014-08-17 NOTE — Other (Signed)
Bedside and Verbal shift change report given to KIM Herbie Drapeegina DANIELS-CURRY, LPN   (oncoming nurse) by Elmyra RicksKim S. RN (offgoing nurse). Report included the following information SBAR, Kardex, MAR and Recent Results.

## 2014-08-17 NOTE — Other (Signed)
Bedside, Verbal and Written shift change report given to April, RN (oncoming nurse) by Selena BattenKim, LPN (offgoing nurse). Report included the following information SBAR, Kardex, Intake/Output, MAR and Recent Results.

## 2014-08-18 LAB — METABOLIC PANEL, BASIC
BUN: 6 mg/dl — ABNORMAL LOW (ref 7–25)
CO2: 29 mEq/L (ref 21–32)
Calcium: 8.7 mg/dl (ref 8.5–10.1)
Chloride: 100 mEq/L (ref 98–107)
Creatinine: 0.5 mg/dl — ABNORMAL LOW (ref 0.6–1.3)
GFR est AA: >60
GFR est non-AA: >60
Glucose: 145 mg/dl — ABNORMAL HIGH (ref 74–106)
Potassium: 2.9 mEq/L — CL (ref 3.5–5.1)
Sodium: 137 mEq/L (ref 136–145)

## 2014-08-18 LAB — CBC WITH AUTOMATED DIFF
BASOPHILS: 0.5 % (ref 0–3)
EOSINOPHILS: 2.6 % (ref 0–5)
HCT: 42.8 % (ref 37.0–50.0)
HGB: 14.5 gm/dl (ref 13.0–17.2)
IMMATURE GRANULOCYTES: 0.5 % (ref 0.0–3.0)
LYMPHOCYTES: 29.2 % (ref 28–48)
MCH: 28.6 pg (ref 25.4–34.6)
MCHC: 33.9 gm/dl (ref 30.0–36.0)
MCV: 84.4 fL (ref 80.0–98.0)
MONOCYTES: 8 % (ref 1–13)
MPV: 11.6 fL — ABNORMAL HIGH (ref 6.0–10.0)
NEUTROPHILS: 59.2 % (ref 34–64)
NRBC: 0 (ref 0–0)
PLATELET: 233 10*3/uL (ref 140–450)
RBC: 5.07 M/uL (ref 3.60–5.20)
RDW-SD: 42.9 (ref 36.4–46.3)
WBC: 12.8 10*3/uL — ABNORMAL HIGH (ref 4.0–11.0)

## 2014-08-18 LAB — CRYPTOSPOR/GIARDIA AG
Cryptosporidium: NEGATIVE
Giardia Ag: NEGATIVE

## 2014-08-18 MED ORDER — SODIUM CHLORIDE 0.9 % IJ SYRG
INTRAMUSCULAR | Status: AC
Start: 2014-08-18 — End: 2014-08-18
  Administered 2014-08-19: 01:00:00

## 2014-08-18 MED ORDER — POTASSIUM CHLORIDE SR 10 MEQ TAB
10 mEq | ORAL | Status: AC
Start: 2014-08-18 — End: 2014-08-18
  Administered 2014-08-18: 17:00:00 via ORAL

## 2014-08-18 MED ORDER — SODIUM CHLORIDE 0.9 % IJ SYRG
INTRAMUSCULAR | Status: AC
Start: 2014-08-18 — End: 2014-08-18
  Administered 2014-08-18: 14:00:00

## 2014-08-18 MED ORDER — POTASSIUM CHLORIDE 2 MEQ/ML IV SOLN
2 mEq/mL | INTRAVENOUS | Status: AC
Start: 2014-08-18 — End: 2014-08-18
  Administered 2014-08-18 (×3): via INTRAVENOUS

## 2014-08-18 MED ORDER — POTASSIUM CHLORIDE 2 MEQ/ML IV SOLN
2 mEq/mL | INTRAVENOUS | Status: DC
Start: 2014-08-18 — End: 2014-08-18
  Administered 2014-08-18 (×4): via INTRAVENOUS

## 2014-08-18 MED ORDER — CLONIDINE 0.1 MG TAB
0.1 mg | ORAL | Status: AC
Start: 2014-08-18 — End: 2014-08-18
  Administered 2014-08-18: 20:00:00 via ORAL

## 2014-08-18 MED ORDER — ZOLPIDEM 5 MG TAB
5 mg | Freq: Every evening | ORAL | Status: DC | PRN
Start: 2014-08-18 — End: 2014-08-19
  Administered 2014-08-18 – 2014-08-19 (×2): via ORAL

## 2014-08-18 MED ORDER — HYDRALAZINE 50 MG TAB
50 mg | Freq: Three times a day (TID) | ORAL | Status: DC
Start: 2014-08-18 — End: 2014-08-19
  Administered 2014-08-18 – 2014-08-19 (×3): via ORAL

## 2014-08-18 MED FILL — HYDROMORPHONE (PF) 1 MG/ML IJ SOLN: 1 mg/mL | INTRAMUSCULAR | Qty: 1

## 2014-08-18 MED FILL — POTASSIUM CHLORIDE 2 MEQ/ML IV SOLN: 2 mEq/mL | INTRAVENOUS | Qty: 5

## 2014-08-18 MED FILL — CLONIDINE 0.1 MG TAB: 0.1 mg | ORAL | Qty: 1

## 2014-08-18 MED FILL — HYDRALAZINE 50 MG TAB: 50 mg | ORAL | Qty: 1

## 2014-08-18 MED FILL — BD POSIFLUSH NORMAL SALINE 0.9 % INJECTION SYRINGE: INTRAMUSCULAR | Qty: 20

## 2014-08-18 MED FILL — POTASSIUM CHLORIDE SR 10 MEQ TAB: 10 mEq | ORAL | Qty: 2

## 2014-08-18 MED FILL — HYDRALAZINE 20 MG/ML IJ SOLN: 20 mg/mL | INTRAMUSCULAR | Qty: 1

## 2014-08-18 MED FILL — GABAPENTIN 300 MG CAP: 300 mg | ORAL | Qty: 2

## 2014-08-18 MED FILL — LORAZEPAM 2 MG/ML IJ SOLN: 2 mg/mL | INTRAMUSCULAR | Qty: 1

## 2014-08-18 MED FILL — ZOLPIDEM 5 MG TAB: 5 mg | ORAL | Qty: 1

## 2014-08-18 MED FILL — LISINOPRIL 20 MG TAB: 20 mg | ORAL | Qty: 2

## 2014-08-18 MED FILL — BD POSIFLUSH NORMAL SALINE 0.9 % INJECTION SYRINGE: INTRAMUSCULAR | Qty: 10

## 2014-08-18 MED FILL — SODIUM CHLORIDE 0.9 % IV PIGGY BACK: INTRAVENOUS | Qty: 100

## 2014-08-18 MED FILL — AMLODIPINE 5 MG TAB: 5 mg | ORAL | Qty: 2

## 2014-08-18 MED FILL — LOVENOX 40 MG/0.4 ML SUBCUTANEOUS SYRINGE: 40 mg/0.4 mL | SUBCUTANEOUS | Qty: 0.4

## 2014-08-18 MED FILL — METOPROLOL TARTRATE 100 MG TAB: 100 mg | ORAL | Qty: 1

## 2014-08-18 MED FILL — PIPERACILLIN-TAZOBACTAM 3.375 GRAM IV SOLR: 3.375 gram | INTRAVENOUS | Qty: 3.38

## 2014-08-18 NOTE — Progress Notes (Signed)
Medical Progress Note      NAME: [name] [name]   DOB:  10/24/1950  MRN:             [id number]    Date/Time: 08/18/2014  1:07 PM      Assessment:     1.?? Abdominal Pain with foreign body in the sigmoid colon with a large chicken bone obstructed in her sigmoid colon.?? ??  S/p endoscopic removal  2.?? Accelerated HTN, BP still high  3.?? Hypokalemia.  4.?? Bilateral nephrolithiasis without obstruction.    Plan:     BP remains elevated SBP in the 180's.  Increase hydralazin, continue norvasc, ACE-I, BB and clonidine  Will monitor today, adjust meds accordingly  May d/c home in am     Subjective:     " I don't feel well today"    Objective:     Vitals:      Last 24hrs VS reviewed since prior progress note. Most recent are:  Visit Vitals   Item Reading   ??? BP 178/111 mmHg   ??? Pulse 97   ??? Temp 99.1 ??F (37.3 ??C)   ??? Resp 18   ??? Ht 5\' 7"  (1.702 m)   ??? Wt 78.5 kg (173 lb 1 oz)   ??? BMI 27.10 kg/m2   ??? SpO2 97%     SpO2 Readings from Last 6 Encounters: 08/18/14 97%   07/21/14 97%    O2 Flow Rate (L/min): 2 l/min     Intake/Output Summary (Last 24 hours) at 08/18/14 1307  Last data filed at 08/18/14 0900   Gross per 24 hour   Intake   1920 ml   Output    653 ml   Net   1267 ml        Exam:     General:   well developed, well nourished, in no acute distress  HEENT: PERRLA, Un icteric Conjunctiva. Neck Supple,  No JVD  Respiratory:   CTA bilaterally-no wheezes, rales, rhonchi, or crackles  Cardiac:  Regular Rate and Rythmn  - no murmurs, rubs or gallops  Abdominal:  Soft, non-tender, non-distended, positive bowel sounds  Extremities:  No cyanosis, or edema.  Skin: No rash  Neurological:  No focal neurological deficits    Medication:     Current Medications Reviewed    Current facility-administered medications:   ???  potassium chloride 10 mEq and lidocaine 10 mg in 100 ml NS IVPB, , IntraVENous, Q1H, Theodis AguasYared G Dianna Deshler, MD  ???  hydrALAZINE (APRESOLINE) tablet 50 mg, 50 mg, Oral, TID, Theodis AguasYared G Marieanne Marxen, MD   ???  zolpidem (AMBIEN) tablet 5 mg, 5 mg, Oral, QHS PRN, Irving BurtonMuhamed Jasarevic, MD, 5 mg at 08/17/14 2327  ???  amLODIPine (NORVASC) tablet 10 mg, 10 mg, Oral, DAILY, Otilio CarpenAnthony A [name], MD, 10 mg at 08/18/14 0831  ???  gabapentin (NEURONTIN) capsule 600 mg, 600 mg, Oral, TID, Otilio CarpenAnthony A [name], MD, 600 mg at 08/18/14 0831  ???  lisinopril (PRINIVIL, ZESTRIL) tablet 40 mg, 40 mg, Oral, DAILY, Otilio CarpenAnthony A [name], MD, 40 mg at 08/18/14 0831  ???  naloxone [facility](NARCAN) injection 0.1 mg, 0.1 mg, IntraVENous, PRN, Otilio CarpenAnthony A [name], MD  ???  acetaminophen (TYLENOL) tablet 325 mg, 325 mg, Oral, Q4H PRN **OR** acetaminophen (TYLENOL) solution 325 mg, 325 mg, Oral, Q4H PRN **OR** acetaminophen (TYLENOL) suppository 325 mg, 325 mg, Rectal, Q4H PRN, Otilio CarpenAnthony A [name], MD  ???  piperacillin-tazobactam (ZOSYN) 3.375 g in 0.9% sodium chloride (MBP/ADV) 100 mL MBP, 3.375  g, IntraVENous, Q8H, Otilio Carpen, MD, Last Rate: 25 mL/hr at 08/18/14 0501, 3.375 g at 08/18/14 0501  ???  cloNIDine (CATAPRES) 0.2 mg/24 hr patch 1 Patch, 1 Patch, TransDERmal, Q7D, Otilio Carpen, MD, 1 Patch at 08/15/14 0734  ???  [START ON 08/22/2014] remove patch reminder, 1 Patch, Topical, ONCE, Otilio Carpen, MD  ???  HYDROmorphone (PF) (DILAUDID) injection 1 mg, 1 mg, IntraVENous, Q2H PRN, Theodis Aguas, MD, 1 mg at 08/18/14 1145  ???  metoprolol (LOPRESSOR) tablet 100 mg, 100 mg, Oral, BID, Theodis Aguas, MD, 100 mg at 08/18/14 0831  ???  hydrALAZINE (APRESOLINE) 20 mg/mL injection 20 mg, 20 mg, IntraVENous, Q6H PRN, Theodis Aguas, MD, 20 mg at 08/18/14 1227  ???  LORazepam (ATIVAN) 2 mg/mL injection 0.5 mg, 0.5 mg, IntraVENous, Q4H PRN, Theodis Aguas, MD  ???  enoxaparin (LOVENOX) injection 40 mg, 40 mg, SubCUTAneous, Q24H, Chantel D Dixon, NP, 40 mg at 08/18/14 0501  ???  ondansetron (ZOFRAN) injection 4 mg, 4 mg, IntraVENous, Q4H PRN, Kara M Demott, PA-C, 4 mg at 08/15/14 9629    Lab:     Lab Data Reviewed: (see below)  Recent Results (from the past 24 hour(s))    CBC WITH AUTOMATED DIFF    Collection Time: 08/18/14  5:05 AM   Result Value Ref Range    WBC 12.8 (H) 4.0 - 11.0 1000/mm3    RBC 5.07 3.60 - 5.20 M/uL    HGB 14.5 13.0 - 17.2 gm/dl    HCT 52.8 41.3 - 24.4 %    MCV 84.4 80.0 - 98.0 fL    MCH 28.6 25.4 - 34.6 pg    MCHC 33.9 30.0 - 36.0 gm/dl    PLATELET 010 272 - 536 1000/mm3    MPV 11.6 (H) 6.0 - 10.0 fL    RDW-SD 42.9 36.4 - 46.3      NRBC 0 0 - 0      IMMATURE GRANULOCYTES 0.5 0.0 - 3.0 %    NEUTROPHILS 59.2 34 - 64 %    LYMPHOCYTES 29.2 28 - 48 %    MONOCYTES 8.0 1 - 13 %    EOSINOPHILS 2.6 0 - 5 %    BASOPHILS 0.5 0 - 3 %   METABOLIC PANEL, BASIC    Collection Time: 08/18/14  5:05 AM   Result Value Ref Range    Sodium 137 136 - 145 mEq/L    Potassium 2.9 (LL) 3.5 - 5.1 mEq/L    Chloride 100 98 - 107 mEq/L    CO2 29 21 - 32 mEq/L    Glucose 145 (H) 74 - 106 mg/dl    BUN 6 (L) 7 - 25 mg/dl    Creatinine 0.5 (L) 0.6 - 1.3 mg/dl    GFR est AA >64.4      GFR est non-AA >60      Calcium 8.7 8.5 - 10.1 mg/dl       No results found.    Active Problems:    Foreign body in colon (08/14/2014)        Prophylaxis:  Lovenox  Coumadin  Hep SQ  SCD???s  H2B/PPI    Disposition:  Home w/ Family   HH PT,OT,RN   SNF/LTC   SAH/Rehab    Care Plan discussed with:    Patient   Family    ED Care Manager  ED Doc   Specialist :      Total time:  64  minutes             _______________________________________________________________________        Attending Physician: Theodis Aguas, MD      [facility]

## 2014-08-18 NOTE — Other (Signed)
Bedside and Verbal shift change report given to Lori RN (oncoming nurse) by Regina RN (offgoing nurse). Report included the following information SBAR, Kardex, MAR and Recent Results.

## 2014-08-18 NOTE — Other (Signed)
Bedside and Verbal shift change report given to REGINA T JACOBS, RN   (oncoming nurse) by April Jones RN (offgoing nurse). Report included the following information SBAR, Kardex, MAR and Recent Results.

## 2014-08-18 NOTE — Progress Notes (Signed)
Case Management Assessment  Patient lives with her spouse and father.  States no pets at home and only 12 steps (one flight) prior to admission patient was driving and able to do 960%100% of her ADLS. Anticipate patient will be able to do 100% of ADLS upon DC as she is doing them now while in the hospital.  Per patient she needs to make sure no more chicken bones get into her stomach. No needs at this time      Preferred Language for Healthcare Related Communication     Preferred Language for Healthcare Related Communication: English   Spiritual/Ethnic?Cultural/Religious Needs that Should be Incorporated Into Your Care          FUNCTIONAL ASSESSMENT                   Decline in Gait/Transfer/Balance: No       Decline in Capacity to Feed/Dress/Bathe: No   Developmental Delay: No   Chewing/Swallowing Problems: No      DYSPHAGIA SCREENING                          Difficulty with Secretions     Difficulty with Secretions: No      Speech Slurred/Thick/Garbled     Speech Slurred/Thick/Garbled: No      ABUSE/NEGLECT SCREENING   Physical Abuse/Neglect: Denies   Sexual Abuse: Denies   Sexual Abuse: Denies   Other Abuse/Issues: Denies          PRIMARY DECISION MAKER   Primary Decision Maker Name: husband (bobby goodwin)     Primary Decision Maker Phone Number: cellphone [phone])       Primary Decision Maker Relationship to Patient: Spouse                      ADVANCE CARE PLANNING (ACP) DOCUMENTS   Confirm Advance Directive: None              Suicide/Psychosocial Screening   Primary Diagnosis or Primary Complaint of an Emotional Behavior Disorder: No   Patient is Currently Experiencing Depression: No   Suicidal Ideation/Attempts: No   Homicidal Ideation/Attempts: No   Alcohol/Drug Intoxication: No   Hallucinations/Delusions: No   Pending, Active, or Temporary Detention Orders: No   Aggressive/Inappropriate Behavior: No      SAD PERSONS                                                  READMIT RISK TOOL    Support Systems: Spouse/Significant Other/Partner   Relationship with Primary Physician Group: No primary care provider   History of Falls Within Past 3 Months: No   Needs Assistance with Wound Care AND/OR Mgnt of O2, Nebulizer: No   Requires Financial, Physical and/or Educational Assistance With Medications: Yes   History of Mental Illness: Yes   Living Alone: No      CARE MANAGEMENT INTERVENTIONS   PCP Verified by CM: Yes                   Transition of Care Consult (CM Consult): Discharge Planning       Discharge Durable Medical Equipment: Yes (has a cane and wlaker at home)               Current Support Network: Lives with Spouse (  also her fatherr lives with her and her spouse)       Care Management Interventions  PCP Verified by CM: Yes  Transition of Care Consult (CM Consult):  (HOme unsure of needs she was asleep and friend in room with her unsure of plans.)  Discharge Durable Medical Equipment: Yes (has a cane and wlaker at home)  Current Support Network: Lives with Spouse (also her fatherr lives with her and her spouse)  Plan discussed with Pt/Family/Caregiver: Yes  Freedom of Choice Offered: Yes   Plan discussed with Pt/Family/Caregiver: Yes   Freedom of Choice Offered: Yes      DISCHARGE LOCATION   Discharge Placement: Home

## 2014-08-19 LAB — CBC WITH AUTOMATED DIFF
BASOPHILS: 0.5 % (ref 0–3)
EOSINOPHILS: 3.1 % (ref 0–5)
HCT: 40.7 % (ref 37.0–50.0)
HGB: 13.5 gm/dl (ref 13.0–17.2)
IMMATURE GRANULOCYTES: 0.2 % (ref 0.0–3.0)
LYMPHOCYTES: 28.1 % (ref 28–48)
MCH: 28.4 pg (ref 25.4–34.6)
MCHC: 33.2 gm/dl (ref 30.0–36.0)
MCV: 85.7 fL (ref 80.0–98.0)
MONOCYTES: 8.1 % (ref 1–13)
MPV: 11.9 fL — ABNORMAL HIGH (ref 6.0–10.0)
NEUTROPHILS: 60 % (ref 34–64)
NRBC: 0 (ref 0–0)
PLATELET: 208 10*3/uL (ref 140–450)
RBC: 4.75 M/uL (ref 3.60–5.20)
RDW-SD: 43.9 (ref 36.4–46.3)
WBC: 9.3 10*3/uL (ref 4.0–11.0)

## 2014-08-19 LAB — METABOLIC PANEL, BASIC
BUN: 6 mg/dl — ABNORMAL LOW (ref 7–25)
CO2: 29 mEq/L (ref 21–32)
Calcium: 8.6 mg/dl (ref 8.5–10.1)
Chloride: 101 mEq/L (ref 98–107)
Creatinine: 0.7 mg/dl (ref 0.6–1.3)
GFR est AA: >60
GFR est non-AA: >60
Glucose: 204 mg/dl — ABNORMAL HIGH (ref 74–106)
Potassium: 2.7 mEq/L — CL (ref 3.5–5.1)
Sodium: 138 mEq/L (ref 136–145)

## 2014-08-19 LAB — CULTURE, STOOL

## 2014-08-19 MED ORDER — HYDRALAZINE 50 MG TAB
50 mg | ORAL_TABLET | Freq: Three times a day (TID) | ORAL | Status: DC
Start: 2014-08-19 — End: 2014-08-24

## 2014-08-19 MED ORDER — SODIUM CHLORIDE 0.9 % IJ SYRG
INTRAMUSCULAR | Status: AC
Start: 2014-08-19 — End: 2014-08-19
  Administered 2014-08-19: 15:00:00

## 2014-08-19 MED ORDER — QUETIAPINE SR 150 MG 24 HR TAB
150 mg | ORAL_TABLET | Freq: Every evening | ORAL | Status: DC
Start: 2014-08-19 — End: 2014-08-24

## 2014-08-19 MED ORDER — SODIUM CHLORIDE 0.9 % IJ SYRG
INTRAMUSCULAR | Status: DC | PRN
Start: 2014-08-19 — End: 2014-08-19
  Administered 2014-08-19 (×4): via INTRAVENOUS

## 2014-08-19 MED ORDER — CLONIDINE 0.3 MG/24 HR WEEKLY TRANSDERM PATCH
0.3 mg/24 hr | TRANSDERMAL | Status: DC
Start: 2014-08-19 — End: 2014-08-19

## 2014-08-19 MED ORDER — SODIUM CHLORIDE 0.9 % IJ SYRG
INTRAMUSCULAR | Status: AC
Start: 2014-08-19 — End: 2014-08-19
  Administered 2014-08-19: 17:00:00

## 2014-08-19 MED ORDER — POTASSIUM CHLORIDE 20 MEQ/100 ML IV PIGGY BACK
20 mEq/100 mL | Freq: Once | INTRAVENOUS | Status: DC
Start: 2014-08-19 — End: 2014-08-19

## 2014-08-19 MED ORDER — REMOVE PATCH (GENERIC)
Freq: Once | Status: DC
Start: 2014-08-19 — End: 2014-08-19

## 2014-08-19 MED ORDER — POTASSIUM CHLORIDE SR 20 MEQ TAB, PARTICLES/CRYSTALS
20 mEq | Freq: Two times a day (BID) | ORAL | Status: DC
Start: 2014-08-19 — End: 2014-08-19
  Administered 2014-08-19 (×2): via ORAL

## 2014-08-19 MED ORDER — METOPROLOL TARTRATE 100 MG TAB
100 mg | ORAL_TABLET | Freq: Two times a day (BID) | ORAL | Status: DC
Start: 2014-08-19 — End: 2014-08-24

## 2014-08-19 MED ORDER — POTASSIUM CHLORIDE 10 MEQ/100 ML IV PIGGY BACK
10 mEq/0 mL | INTRAVENOUS | Status: AC
Start: 2014-08-19 — End: 2014-08-19
  Administered 2014-08-19 (×2): via INTRAVENOUS

## 2014-08-19 MED ORDER — OXYCODONE-ACETAMINOPHEN 10 MG-325 MG TAB
10-325 mg | ORAL_TABLET | Freq: Four times a day (QID) | ORAL | Status: DC | PRN
Start: 2014-08-19 — End: 2014-08-24

## 2014-08-19 MED FILL — HYDROMORPHONE (PF) 1 MG/ML IJ SOLN: 1 mg/mL | INTRAMUSCULAR | Qty: 1

## 2014-08-19 MED FILL — BD POSIFLUSH NORMAL SALINE 0.9 % INJECTION SYRINGE: INTRAMUSCULAR | Qty: 10

## 2014-08-19 MED FILL — POTASSIUM CHLORIDE 10 MEQ/100 ML IV PIGGY BACK: 10 mEq/0 mL | INTRAVENOUS | Qty: 100

## 2014-08-19 MED FILL — REMOVE PATCH (GENERIC): Qty: 1

## 2014-08-19 MED FILL — KLOR-CON M20 MEQ TABLET,EXTENDED RELEASE: 20 mEq | ORAL | Qty: 2

## 2014-08-19 MED FILL — HYDRALAZINE 50 MG TAB: 50 mg | ORAL | Qty: 1

## 2014-08-19 MED FILL — GABAPENTIN 300 MG CAP: 300 mg | ORAL | Qty: 2

## 2014-08-19 MED FILL — METOPROLOL TARTRATE 100 MG TAB: 100 mg | ORAL | Qty: 1

## 2014-08-19 MED FILL — BD POSIFLUSH NORMAL SALINE 0.9 % INJECTION SYRINGE: INTRAMUSCULAR | Qty: 30

## 2014-08-19 MED FILL — ZOLPIDEM 5 MG TAB: 5 mg | ORAL | Qty: 1

## 2014-08-19 MED FILL — CLONIDINE 0.3 MG/24 HR WEEKLY TRANSDERM PATCH: 0.3 mg/24 hr | TRANSDERMAL | Qty: 1

## 2014-08-19 MED FILL — HYDRALAZINE 20 MG/ML IJ SOLN: 20 mg/mL | INTRAMUSCULAR | Qty: 1

## 2014-08-19 MED FILL — LOVENOX 40 MG/0.4 ML SUBCUTANEOUS SYRINGE: 40 mg/0.4 mL | SUBCUTANEOUS | Qty: 0.4

## 2014-08-19 MED FILL — BD POSIFLUSH NORMAL SALINE 0.9 % INJECTION SYRINGE: INTRAMUSCULAR | Qty: 20

## 2014-08-19 MED FILL — SODIUM CHLORIDE 0.9 % IV PIGGY BACK: INTRAVENOUS | Qty: 100

## 2014-08-19 MED FILL — LISINOPRIL 20 MG TAB: 20 mg | ORAL | Qty: 2

## 2014-08-19 MED FILL — AMLODIPINE 5 MG TAB: 5 mg | ORAL | Qty: 2

## 2014-08-19 MED FILL — PIPERACILLIN-TAZOBACTAM 3.375 GRAM IV SOLR: 3.375 gram | INTRAVENOUS | Qty: 3.38

## 2014-08-19 NOTE — Progress Notes (Signed)
Patient DC'd home with no needs identified.  Family driving patient home.  All DC instructions, and scripts with follow up appountments will be given to patient by DC RN at time of DC

## 2014-08-19 NOTE — Other (Signed)
Bedside and Verbal shift change report given to REGINA T JACOBS, RN   (oncoming nurse) by Lori RN (offgoing nurse). Report included the following information SBAR, Kardex, MAR and Recent Results.

## 2014-08-19 NOTE — Progress Notes (Signed)
Pt discharged home in satisfactory condition. Discharge instructions and prescriptions given and  Explained to pt and caregiver Randa Evens(JoAnne, neighbor). Pt transported to lobby via wheelchair with Care partner. All  personal belongings are with the pt.

## 2014-08-19 NOTE — Discharge Summary (Signed)
Discharge Summary      Patient ID:    [name] [name]  [id number]  64 y.o.  [birth year]    Admit date: 08/14/2014    Discharge date and time: 08/19/2014    CONSULTATIONS: GI and General Surgery    Admission Diagnoses: Foreign body in colon  ABNORMAL CT SCAN    Chronic Diagnoses:    Problem List as of 08/19/2014  Date Reviewed: 08/13/2014          Codes Class Noted - Resolved    Foreign body in colon ICD-10-CM: T18.4XXA  ICD-9-CM: 936  08/14/2014 - Present        Advanced care planning/counseling discussion ICD-10-CM: Z71.89  ICD-9-CM: V65.49  08/13/14 - Present    Overview Signed 13-Aug-2014  3:46 PM by Littie Deeds, MD     Patient given [facility] application               Essential hypertension ICD-10-CM: I10  ICD-9-CM: 401.9  08/13/2014 - Present        Hip pain, chronic ICD-10-CM: M25.559, G89.29  ICD-9-CM: 719.45, 338.29  08/13/14 - Present              HOSPITAL COURSE:   1.?? Abdominal Pain with foreign body in the sigmoid colon with a large chicken bone obstructed in her sigmoid colon.??   Patient presented with abdominal pain  CT abdomen showed bone in sigmoid colon.  GI and surgery was consulted.  She has flex sigmoidoscopy with removal of large chicken bone.??    2.?? Accelerated HTN,  Patient on several antihypertensives  Meds adjusted. Increased Metoprolol, hydralazine.  Will continue all meds as listed below.    3.?? Hypokalemia. Continue replacement    4.?? Bilateral nephrolithiasis without obstruction. Asymptomatic      Discharge Medications:   Current Discharge Medication List      CONTINUE these medications which have CHANGED    Details   metoprolol (LOPRESSOR) 100 mg tablet Take 1 Tab by mouth two (2) times a day.  Qty: 30 Tab, Refills: 0      oxyCODONE-acetaminophen (PERCOCET 10) 10-325 mg per tablet Take 1 Tab by mouth every six (6) hours as needed for Pain. Max Daily Amount: 4 Tabs.  Qty: 20 Tab, Refills: 0    Associated Diagnoses: Hip pain, chronic, right       hydrALAZINE (APRESOLINE) 50 mg tablet Take 1 Tab by mouth three (3) times daily for 30 days.  Qty: 90 Tab, Refills: 0         CONTINUE these medications which have NOT CHANGED    Details   amLODIPine (NORVASC) 10 mg tablet Take 10 mg by mouth daily.      cloNIDine HCl (CATAPRES) 0.3 mg tablet Take 0.3 mg by mouth two (2) times a day.      potassium chloride (K-DUR, KLOR-CON) 20 mEq tablet Take 20 mEq by mouth daily.      cyclobenzaprine (FLEXERIL) 10 mg tablet Take  by mouth three (3) times daily as needed for Muscle Spasm(s).      lisinopril (PRINIVIL, ZESTRIL) 20 mg tablet Take 40 mg by mouth daily.      NIFEdipine ER (PROCARDIA XL) 60 mg ER tablet Take 60 mg by mouth daily.      gabapentin (NEURONTIN) 600 mg tablet Take 1 Tab by mouth three (3) times daily.  Qty: 90 Tab, Refills: 0    Associated Diagnoses: Sciatica, right      meloxicam (MOBIC) 7.5 mg  tablet Take 1 Tab by mouth daily.  Qty: 60 Tab, Refills: 0    Associated Diagnoses: Hip pain, chronic, right         STOP taking these medications       Walker misc Comments:   Reason for Stopping:                Significant Diagnostic Studies:   Recent Labs      08/19/14   0542  08/18/14   0505   WBC  9.3  12.8*   HGB  13.5  14.5   HCT  40.7  42.8   PLT  208  233     Recent Labs      08/19/14   0542  08/18/14   0505  08/17/14   0601   NA  138  137  141   K  2.7*  2.9*  2.8*   CL  101  100  104   CO2  29  29  29    BUN  6*  6*  8   CREA  0.7  0.5*  0.6   GLU  204*  145*  116*   CA  8.6  8.7  8.1*     No results for input(s): SGOT, GPT, AP, TBIL, TP, ALB, GLOB, GGT, AML, LPSE in the last 72 hours.    Invalid input(s): AMYP, HLPSE  No results for input(s): INR, PTP, APTT in the last 72 hours.    Invalid input(s): INREXT   No results for input(s): FE, TIBC, PSAT, FERR in the last 72 hours.   No results for input(s): PH, PCO2, PO2 in the last 72 hours.  No results for input(s): CPK, CKMB in the last 72 hours.    Invalid input(s): TROPONINI   No components found for: GLPOC    Follow up Care:    1. [name] K Tharakan, MD in 1-2 weeks      Diet:  Cardiac Diet    Disposition:  Home.    Total D/c time: 40 minutes      Signed:  Theodis AguasYared G Cleo Villamizar, MD  08/19/2014  10:37 AM

## 2014-08-21 LAB — OVA & PARASITES, STOOL

## 2014-08-22 MED FILL — KETAMINE 50 MG/ML IJ SOLN: 50 mg/mL | INTRAMUSCULAR | Qty: 25

## 2014-08-24 ENCOUNTER — Inpatient Hospital Stay
Admit: 2014-08-24 | Discharge: 2014-08-25 | Disposition: A | Discharge status: Home / Self Care | Payer: MEDICARE | Attending: Emergency Medicine

## 2014-08-24 DIAGNOSIS — G8929 Other chronic pain: Secondary | ICD-10-CM

## 2014-08-24 MED ORDER — MORPHINE 4 MG/ML SYRINGE
4 mg/mL | INTRAMUSCULAR | Status: AC
Start: 2014-08-24 — End: 2014-08-24
  Administered 2014-08-24: 23:00:00 via INTRAMUSCULAR

## 2014-08-24 MED ORDER — METHOCARBAMOL 750 MG TAB
750 mg | ORAL | Status: AC
Start: 2014-08-24 — End: 2014-08-24
  Administered 2014-08-24: 22:00:00 via ORAL

## 2014-08-24 MED ORDER — NAPROXEN 500 MG TAB
500 mg | ORAL_TABLET | Freq: Two times a day (BID) | ORAL | Status: DC
Start: 2014-08-24 — End: 2014-09-23

## 2014-08-24 MED ORDER — OXYCODONE-ACETAMINOPHEN 5 MG-325 MG TAB
5-325 mg | ORAL_TABLET | ORAL | Status: DC | PRN
Start: 2014-08-24 — End: 2014-09-01

## 2014-08-24 MED ORDER — OXYCODONE-ACETAMINOPHEN 5 MG-325 MG TAB
5-325 mg | ORAL | Status: AC
Start: 2014-08-24 — End: 2014-08-24
  Administered 2014-08-24: 22:00:00 via ORAL

## 2014-08-24 MED FILL — METHOCARBAMOL 750 MG TAB: 750 mg | ORAL | Qty: 1

## 2014-08-24 MED FILL — OXYCODONE-ACETAMINOPHEN 5 MG-325 MG TAB: 5-325 mg | ORAL | Qty: 1

## 2014-08-24 MED FILL — MORPHINE 4 MG/ML SYRINGE: 4 mg/mL | INTRAMUSCULAR | Qty: 1

## 2014-08-24 NOTE — ED Notes (Signed)
I have reviewed discharge instructions with the patient and spouse.  The patient and spouse verbalized understanding.

## 2014-08-24 NOTE — ED Notes (Addendum)
Pt states that she has been having R leg pain for the last couple of weeks since she was dc from the hospital. Denies any swelling. Pt states she is tender to the R thigh. Denies any injury. Pt states she is trying to get into pain management now.

## 2014-08-25 NOTE — ED Provider Notes (Signed)
[place]  EMERGENCY DEPARTMENT TREATMENT REPORT  NAME:  [name], [name]  SEX:   F  ADMIT: 08/24/2014  DOB:   September 04, 1950  MR#    [id number]  ROOM:  [id number]  TIME DICTATED: 12 57 AM  ACCT#  [id number]    cc: Mike Gip MD    DATE OF SERVICE:   08/24/2014     PRIMARY CARE PROVIDER:   Mike Gip, M.D.     CHIEF COMPLAINT:  Right hip pain.    HISTORY OF PRESENT ILLNESS:  The patient is a 64 year old female.  She reports that she has a history of   chronic right hip pain.  This hip pain gets worse on and off.  This has been   ongoing for over a year.  She states that this pain started ever since she had   a bone marrow biopsy in her right hip.  She denies any new injury or trauma.    She states that she was in the hospital last week and she was receiving IV   pain medications; this has kept her pain very well controlled.  She states she   was receiving Dilaudid.  She reports that she was discharged from the   hospital and she has had no further pain medication.  She denies any fevers,   body aches.  She states  that she is able to range of motion the hip but it is   worse when she is weightbearing.  She does report that she had an MRI   approximately a year ago.     REVIEW OF SYSTEMS:   CONSTITUTIONAL:  No fever, chills, or weight loss.   ENT:  No sore throat, runny nose, or other URI symptoms.   RESPIRATORY:  No cough, shortness of breath, or wheezing.   CARDIOVASCULAR:  No chest pain, chest pressure, or palpitations.  MUSCULOSKELETAL:  Right hip pain.  INTEGUMENTARY:  No rashes.   NEUROLOGICAL:  No headaches, sensory or motor symptoms.     PAST MEDICAL HISTORY:  Includes chronic  right hip pain, hypertension, reflux and PTSD.    PAST SURGICAL HISTORY:  Includes hysterectomy.    SOCIAL HISTORY:  The patient denies smoking, alcohol or drug use.    ALLERGIES:  NO KNOWN DRUG ALLERGIES.    CURRENT MEDICATIONS:   Metoprolol and hydrochlorothiazide.    PHYSICAL EXAMINATION:   VITAL SIGNS:  Temperature is 98, pulse 94, respirations 18, blood pressure   212/139, O2 sats 99% on room air.  GENERAL APPEARANCE:  Patient appears well developed and well nourished.    Appearance and behavior are age and situation appropriate.    RESPIRATORY:  Clear and equal breath sounds.  No respiratory distress,   tachypnea, or accessory muscle use.    CARDIOVASCULAR:   Heart regular, without murmurs, gallops, rubs, or thrills.    MUSCULOSKELETAL:  The patient has tenderness when palpating over the greater   trochanter of the right hip.  There is no obvious deformity, erythema,   ecchymosis or warmth noted.  I am able to passively range of motion the hip   and I elicit minimal pain.  Dorsalis pedal pulses are equal and intact.  SKIN:  Warm and dry without rashes.  NEUROLOGIC:  Alert, oriented.  Sensation intact, motor strength equal and   symmetric.     INITIAL ASSESSMENT AND TREATMENT PLAN:  The patient is being evaluated for her hip pain.  The patient's past record   was reviewed from her  recent hospitalization where patient underwent   laboratory work on 08/19/2014.  The patient's BMP glucose was elevated at 204,   but her BUN and creatinine were not elevated.  She did have a CT scan done on   08/14/2014 it was the radiologist did comment on patient's pelvic area and   states that patient then had some degeneration of the hips noted.  No further   diagnostic studies are done today.    EMERGENCY DEPARTMENT COURSE:  I discussed with patient following up with orthopedics for her chronic pain   and degenerative joint disease.  She was medicated here, she was given a   Percocet and 4 mg morphine IM.  She will be discharged with a small amount of   narcotic pain medication but she does need to follow up for further pain   control.  At this time it does not appear that she has an acute process going   on and this is all chronic.    DIAGNOSIS:  Right hip pain, chronic.    DISPOSITION:   The patient is being discharged home.  She is receiving a prescription for   Norco.  She is to return with any worsening or concerning symptoms.     The patient was personally evaluated by myself and Dr. Joneen Boers who agrees   with the above assessment and plan.      ___________________  Lennox Laity MD  Dictated By: Michel Harrow, NP    My signature above authenticates this document and my orders, the final  diagnosis (es), discharge prescription (s), and instructions in the [facility] record.  Nursing notes have been reviewed by the physician/mid-level provider.    If you have any questions please contact [phone].    DS  D:08/25/2014 00:57:50  T: 08/25/2014 62:70:35  [id number]

## 2014-09-01 ENCOUNTER — Ambulatory Visit: Admit: 2014-09-01 | Discharge: 2014-09-01 | Payer: MEDICARE | Attending: Family Medicine | Primary: Physician Assistant

## 2014-09-01 DIAGNOSIS — I10 Essential (primary) hypertension: Secondary | ICD-10-CM

## 2014-09-01 MED ORDER — OXYCODONE-ACETAMINOPHEN 5 MG-325 MG TAB
5-325 mg | ORAL_TABLET | ORAL | Status: DC | PRN
Start: 2014-09-01 — End: 2014-09-05

## 2014-09-01 NOTE — Progress Notes (Signed)
[name] FantasiaMonica [name] is a 64 y.o. female here today for a follow up on her HTN. States she is having right breast pain times 2 days. Last mammo was last year and it was normal.    Learning assessment previously completed.    1. Have you been to the ER, urgent care clinic or hospitalized since your last visit? Yes, 2 weeks ago she swallowed a chicken bone    2. Have you seen or consulted any other health care providers outside of the [facility] since your last visit (Include any pap smears or colon screening)? no    Do you have an Advanced Directive? no    Would you like information on Advanced Directives? yes

## 2014-09-01 NOTE — Patient Instructions (Addendum)
Please limit salt in your diet.  2000 mg daily.  Avoid shaking salt on your food at the lunch or dinner table.  Try alternative seasonings such as Mrs Sharilyn SitesDash and natural herbs.    Please engage in regular exercise.  Moderate intensity aerobic exercise  (60-70% maximal heart rate) for at least 150 minutes per week spread over a minimum of 3 days.  Do not take more than 2 days off from exercising. Activities such as brisk walking, jogging, bicycling and swimming.      Come in tomorrow for blood pressure check. Please bring your medications with you.        DASH Diet: Care Instructions  Your Care Instructions  The DASH diet is an eating plan that can help lower your blood pressure. DASH stands for Dietary Approaches to Stop Hypertension. Hypertension is high blood pressure.  The DASH diet focuses on eating foods that are high in calcium, potassium, and magnesium. These nutrients can lower blood pressure. The foods that are highest in these nutrients are fruits, vegetables, low-fat dairy products, nuts, seeds, and legumes. But taking calcium, potassium, and magnesium supplements instead of eating foods that are high in those nutrients does not have the same effect. The DASH diet also includes whole grains, fish, and poultry.  The DASH diet is one of several lifestyle changes your doctor may recommend to lower your high blood pressure. Your doctor may also want you to decrease the amount of sodium in your diet. Lowering sodium while following the DASH diet can lower blood pressure even further than just the DASH diet alone.  Follow-up care is a key part of your treatment and safety. Be sure to make and go to all appointments, and call your doctor if you are having problems. It's also a good idea to know your test results and keep a list of the medicines you take.  How can you care for yourself at home?  Following the DASH diet  ?? Eat 4 to 5 servings of fruit each day. A serving is 1 medium-sized piece  of fruit, ?? cup chopped or canned fruit, 1/4 cup dried fruit, or 4 ounces (?? cup) of fruit juice. Choose fruit more often than fruit juice.  ?? Eat 4 to 5 servings of vegetables each day. A serving is 1 cup of lettuce or raw leafy vegetables, ?? cup of chopped or cooked vegetables, or 4 ounces (?? cup) of vegetable juice. Choose vegetables more often than vegetable juice.  ?? Get 2 to 3 servings of low-fat and fat-free dairy each day. A serving is 8 ounces of milk, 1 cup of yogurt, or 1 ?? ounces of cheese.  ?? Eat 6 to 8 servings of grains each day. A serving is 1 slice of bread, 1 ounce of dry cereal, or ?? cup of cooked rice, pasta, or cooked cereal. Try to choose whole-grain products as much as possible.  ?? Limit lean meat, poultry, and fish to 2 servings each day. A serving is 3 ounces, about the size of a deck of cards.  ?? Eat 4 to 5 servings of nuts, seeds, and legumes (cooked dried beans, lentils, and split peas) each week. A serving is 1/3 cup of nuts, 2 tablespoons of seeds, or ?? cup of cooked beans or peas.  ?? Limit fats and oils to 2 to 3 servings each day. A serving is 1 teaspoon of vegetable oil or 2 tablespoons of salad dressing.  ?? Limit sweets and added sugars to  5 servings or less a week. A serving is 1 tablespoon jelly or jam, ?? cup sorbet, or 1 cup of lemonade.  ?? Eat less than 2,300 milligrams (mg) of sodium a day. If you have high blood pressure, diabetes, or chronic kidney disease, if you are African-American, or if you are older than age 550, try to limit the amount of sodium you eat to less than 1,500 mg a day.  Tips for success  ?? Start small. Do not try to make dramatic changes to your diet all at once. You might feel that you are missing out on your favorite foods and then be more likely to not follow the plan. Make small changes, and stick with them. Once those changes become habit, add a few more changes.  ?? Try some of the following:   ?? Make it a goal to eat a fruit or vegetable at every meal and at snacks. This will make it easy to get the recommended amount of fruits and vegetables each day.  ?? Try yogurt topped with fruit and nuts for a snack or healthy dessert.  ?? Add lettuce, tomato, cucumber, and onion to sandwiches.  ?? Combine a ready-made pizza crust with low-fat mozzarella cheese and lots of vegetable toppings. Try using tomatoes, squash, spinach, broccoli, carrots, cauliflower, and onions.  ?? Have a variety of cut-up vegetables with a low-fat dip as an appetizer instead of chips and dip.  ?? Sprinkle sunflower seeds or chopped almonds over salads. Or try adding chopped walnuts or almonds to cooked vegetables.  ?? Try some vegetarian meals using beans and peas. Add garbanzo or kidney beans to salads. Make burritos and tacos with mashed pinto beans or black beans.   Where can you learn more?   Go to [email or web address].huhttp://www.healthwise.net/[facility]  Enter H967 in the search box to learn more about "DASH Diet: Care Instructions."   ?? 2006-2015 Healthwise[employer]. Care instructions adapted under license by [employer] (which disclaims liability or warranty for this information). This care instruction is for use with your licensed healthcare professional. If you have questions about a medical condition or this instruction, always ask your healthcare professional. Healthwise, Incorporated disclaims any warranty or liability for your use of this information.  Content Version: 10.7.482551; Current as of: August 02, 2013
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
## 2014-09-05 ENCOUNTER — Encounter: Admit: 2014-09-05 | Primary: Physician Assistant

## 2014-09-05 ENCOUNTER — Ambulatory Visit
Admit: 2014-09-05 | Discharge: 2014-09-05 | Payer: MEDICARE | Attending: Orthopaedic Surgery | Primary: Physician Assistant

## 2014-09-05 ENCOUNTER — Encounter

## 2014-09-05 ENCOUNTER — Institutional Professional Consult (permissible substitution): Admit: 2014-09-05 | Discharge: 2014-09-05 | Payer: MEDICARE | Attending: Family Medicine | Primary: Physician Assistant

## 2014-09-05 ENCOUNTER — Encounter: Admit: 2014-09-05 | Discharge: 2014-09-05 | Payer: MEDICARE | Primary: Physician Assistant

## 2014-09-05 DIAGNOSIS — M25551 Pain in right hip: Secondary | ICD-10-CM

## 2014-09-05 DIAGNOSIS — M160 Bilateral primary osteoarthritis of hip: Secondary | ICD-10-CM

## 2014-09-05 DIAGNOSIS — I10 Essential (primary) hypertension: Secondary | ICD-10-CM

## 2014-09-05 MED ORDER — OXYCODONE-ACETAMINOPHEN 10 MG-325 MG TAB
10-325 mg | ORAL_TABLET | Freq: Four times a day (QID) | ORAL | Status: DC | PRN
Start: 2014-09-05 — End: 2014-09-23
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
## 2014-09-05 NOTE — Progress Notes (Signed)
Right > Left hip pain. No Imaging, No treatment.

## 2014-09-05 NOTE — Patient Instructions (Addendum)
Hip Arthritis: Care Instructions  Your Care Instructions     Arthritis, also called osteoarthritis, is a breakdown of the tissue (cartilage) that cushions your joints. Many people have some arthritis as they age. When the cartilage in your hip joints wears down, your hip bone rubs against the hip socket. This causes pain and stiffness.  Work with your doctor to find the right mix of treatments for your arthritis. There are things you can do at home to protect your hip joints, ease your pain, and help you stay active. But if your arthritis becomes so bad that you cannot walk, you may need surgery to replace the hip joint.  Follow-up care is a key part of your treatment and safety. Be sure to make and go to all appointments, and call your doctor if you are having problems. It???s also a good idea to know your test results and keep a list of the medicines you take.  How can you care for yourself at home?  ?? Stay at a healthy weight. Being overweight puts extra strain on your hip joints.  ?? Talk to your doctor or physical therapist about exercises that will help ease hip pain. These tips may help.  ?? Stretch to help prevent stiffness and to prevent injury before you exercise. You may enjoy gentle forms of yoga to help keep your joints and muscles flexible.  ?? Walk instead of jog. Other types of exercise that are less stressful on the joints include riding a bike, swimming, and doing water exercise.  ?? Lift weights. Strong muscles help reduce stress on your joints. Stronger thigh muscles, for example, take some of the stress off of the knees and hips. Learn the right way to lift weights so you do not make joint pain worse.  ?? Take pain medicines exactly as directed.  ?? If the doctor gave you a prescription medicine for pain, take it as prescribed.  ?? If you are not taking a prescription pain medicine, ask your doctor if you can take an over-the-counter medicine.   ?? Use a cane, crutch, walker, or another device if you need help to get around. These can help rest your hips. You also can use other things to make life easier, such as a higher toilet seat.  ?? Do not sit in low chairs, which can make it painful to get up.  ?? Put heat or cold on your sore hips as needed. Use whichever helps you most. You also can go back and forth between hot and cold packs.  ?? Apply heat 2 or 3 times a day for 20 to 30 minutes???using a heating pad, hot shower, or hot pack???to relieve pain and stiffness.  ?? Put ice or a cold pack on your sore hips for 10 to 20 minutes at a time to numb the area. Put a thin cloth between the ice and your skin.  ?? Think about talking to your doctor about using capsaicin, a cream you apply to the skin for pain relief.  When should you call for help?  Call your doctor now or seek immediate medical care if:  ?? You have sudden swelling, warmth, or pain in any joint.  ?? You have joint pain and a fever or rash.  ?? You have such bad pain that you cannot use the joint.  Watch closely for changes in your health, and be sure to contact your doctor if:  ?? You have mild joint symptoms that continue even with more   than 6 weeks of care at home.  ?? You do not get better as expected.  ?? You have stomach pain or other problems with your medicine.   Where can you learn more?   Go to [email or web address]  Enter T640 in the search box to learn more about "Hip Arthritis: Care Instructions."   ?? 2006-2015 Healthwise, Inc[employer]. Care instructions adapted under license by [employer] (which disclaims liability or warranty for this information). This care instruction is for use with your licensed healthcare professional. If you have questions about a medical condition or this instruction, always ask your healthcare professional. Healthwise, Incorporated disclaims any warranty or liability for your use of this information.   Content Version: 10.7.482551; Current as of: January 31, 2014              Joint Injections: Care Instructions  Your Care Instructions  Joint injections are shots into a joint, such as the knee. They may be used to put in medicines, such as pain relievers. Or they can be used to take out fluid. Sometimes the fluid is tested in a lab. This can help find the cause of a joint problem.  A corticosteroid, or steroid, shot is used to reduce inflammation in tendons or joints. It is often used to treat problems such as arthritis, tendinitis, and bursitis.  Steroids can be injected directly into a painful, inflamed joint. They can also help reduce inflammation of a bursa. A bursa is a sac of fluid. It cushions and lubricates areas where tendons, ligaments, skin, muscles, or bones rub against each other.  A steroid shot can sometimes help with short-term pain relief when other treatments haven't worked. If steroid shots help, pain may improve for weeks or months.  Follow-up care is a key part of your treatment and safety. Be sure to make and go to all appointments, and call your doctor if you are having problems. It's also a good idea to know your test results and keep a list of the medicines you take.  How can you care for yourself at home?  ?? Put ice or a cold pack on the area for 10 to 20 minutes at a time. Put a thin cloth between the ice and your skin.  ?? Take anti-inflammatory medicines to reduce pain, swelling, or inflammation. These include ibuprofen (Advil, Motrin) and naproxen (Aleve). Read and follow all instructions on the label.  ?? Avoid strenuous activities for several days, especially those that put stress on the area where you got the shot.  ?? If you have dressings over the area, keep them clean and dry. You may remove them when your doctor tells you to.  When should you call for help?  Call your doctor now or seek immediate medical care if:  ?? You have signs of infection, such as:   ?? Increased pain, swelling, warmth, or redness.  ?? Red streaks leading from the site.  ?? Pus draining from the site.  ?? A fever.  Watch closely for changes in your health, and be sure to contact your doctor if you have any problems.   Where can you learn more?   Go to [email or web address]  Enter N616 in the search box to learn more about "Joint Injections: Care Instructions."   ?? 2006-2015 Healthwise, Inc[employer]. Care instructions adapted under license by [employer] (which disclaims liability or warranty for this information). This care instruction is for use with your licensed healthcare professional. If you have questions about a medical condition  or this instruction, always ask your healthcare professional. Healthwise, Incorporated disclaims any warranty or liability for your use of this information.  Content Version: 10.7.482551; Current as of: Nov 01, 2013              Deciding About Total Hip Replacement  Your Care Instructions     Total hip replacement is surgery to replace the worn parts of your hip joint. You may be thinking about having this surgery if you have severe hip pain that limits your movement. Many people get arthritis that can cause the hip joints to wear down over time.  In this surgery, your doctor uses metal, ceramic, or plastic parts to replace the ball at the upper end of the thighbone (femur) and resurface the hip socket in the pelvic bone.  You can talk with your doctor to decide if this surgery is right for you. You may want to consider your age, overall health, activity level, and how much pain you have.  Follow-up care is a key part of your treatment and safety. Be sure to make and go to all appointments, and call your doctor if you are having problems. It's also a good idea to know your test results and keep a list of the medicines you take.  Why would you have hip replacement?  ?? You have very bad hip pain. Medicine and other treatments do not ease the pain.   ?? You cannot move well enough to do your daily activities.  ?? You want to keep golfing, biking, hiking, and doing other exercise.  ?? You are at a healthy weight or will be able to lose weight and keep it off. Being too heavy puts strain on the hip joint. This could make a new hip wear out more quickly than it would if you were at a healthy weight.  ?? You want to have the surgery before you lose too much of your ability to be active. If you are not able to stay active, strong, and flexible before the surgery, it can be harder to return to your normal activities after the surgery.  Why would you not have hip replacement?  ?? You are able to control your pain with medicine and other actions. These may include weight loss, changing your activities, and using a cane or crutches. You also could have shots of medicine to reduce pain in your hip.  ?? You are still able to move well enough to work, shop, walk, and do other things.  ?? You worry about doing physical therapy, or rehab, after the surgery. This takes 6 months. It also requires changes in how you sit, walk, and do other actions.  ?? You have a medical problem that could mean you would not do well with anesthesia and surgery.  ?? You may need another hip in 10 to 20 years. This is common with hip replacement.  When should you call for help?  Watch closely for changes in your health, and be sure to contact your doctor if:  ?? You want to learn more about hip replacement.  ?? You want to have a hip replacement.   Where can you learn more?   Go to [email or web address]  Enter P339 in the search box to learn more about "Deciding About Total Hip Replacement."   ?? 2006-2015 Healthwise, [employer]. Care instructions adapted under license by [employer] (which disclaims liability or warranty for this information). This care instruction is for use with your licensed healthcare professional.  If you have questions about a medical condition  or this instruction, always ask your healthcare professional. Healthwise, Incorporated disclaims any warranty or liability for your use of this information.  Content Version: 10.7.482551; Current as of: Nov 01, 2013            [name] [name] will contact you within 5 business days to schedule your study. Her direct number is [phone]. Direct all scheduling questions to her. Should 5 business days pass without you being contacted to schedule your test, call her directly.

## 2014-09-05 NOTE — Progress Notes (Signed)
[name] [name] is a 64 y.o. female here today for a BP check. Patient just returning from ortho appointment.

## 2014-09-05 NOTE — Progress Notes (Signed)
Xray only.

## 2014-09-08 NOTE — Progress Notes (Signed)
Request for RT Hip injection under fluoro [facility] requesting authorization and scheduling of procedure.

## 2014-09-15 ENCOUNTER — Inpatient Hospital Stay
Admit: 2014-09-15 | Discharge: 2014-09-15 | Disposition: A | Discharge status: Home / Self Care | Payer: MEDICARE | Attending: Emergency Medicine

## 2014-09-15 DIAGNOSIS — M25552 Pain in left hip: Secondary | ICD-10-CM

## 2014-09-15 MED ORDER — MORPHINE 4 MG/ML SYRINGE
4 mg/mL | INTRAMUSCULAR | Status: AC
Start: 2014-09-15 — End: 2014-09-15
  Administered 2014-09-15: 11:00:00 via INTRAMUSCULAR

## 2014-09-15 MED FILL — MORPHINE 4 MG/ML SYRINGE: 4 mg/mL | INTRAMUSCULAR | Qty: 1

## 2014-09-15 NOTE — ED Provider Notes (Signed)
[facility]  EMERGENCY DEPARTMENT TREATMENT REPORT  NAME:  [name], [name]  SEX:   F  ADMIT: 09/15/2014  DOB:   [birth year]  MR#    [id number]  ROOM:  [id number]  TIME DICTATED: 07 21 AM  ACCT#  [id number]        TIME OF SERVICE:  0545.    CHIEF COMPLAINT:  Low back pain.    HISTORY OF PRESENT ILLNESS:  The patient is a 64 year old female presenting to the Emergency Department   with left hip pain, worsening since yesterday. She states she has not   sustained any trauma or falls.  The pain is worse with weightbearing but she   is able to do so. She states that the pain radiates to the foot with numbness   and tingling.  No abdominal pain, no urinary symptoms, no bladder or bowel   dysfunction.  No overlying rash or skin changes, no fever or chills.    REVIEW OF SYSTEMS:  CONSTITUTIONAL:  No fever, chills or weight loss.   GASTROINTESTINAL:  No vomiting, diarrhea or abdominal pain.   GENITOURINARY:  No dysuria, frequency or urgency.    INTEGUMENTARY:  No rashes.   NEUROLOGIC:  Radiation of the left hip pain to the lower extremity.  MUSCULOSKELETAL:  Left hip pain.    PAST MEDICAL HISTORY:  Chronic pain, hypertension, GERD, PTSD.    PAST SURGICAL HISTORY:  Hysterectomy.    SOCIAL HISTORY:  The patient denies tobacco use.    CURRENT MEDICATIONS AND ALLERGIES:  REVIEWED IN EPIC.    PHYSICAL EXAMINATION:  VITAL SIGNS:  Blood pressure 126/112, pulse 88, respirations 16, temperature   98.1, O2 sat 99%.  GENERAL APPEARANCE:  Patient appears well developed and well nourished.    Appearance and behavior are age and situation appropriate.     HEENT:  Eyes:  Conjunctivae clear, lids normal.  RESPIRATORY:  No respiratory distress.  SKIN:  Warm and dry.  MUSCULOSKELETAL:  The patient has full range of motion of bilateral lower   extremities including the left hip which is the affected joint.  She has no   tenderness to palpation of the bony prominences, there is no joint effusion,    no redness or warmth of the affected joint.  She does have pain with flexion   and external rotation of the hip but again has full range of motion.  NEUROLOGIC:  Motor strength is equal and symmetric.  Sensation intact of lower   extremities.  VASCULAR:  Pedal pulses are strong and equal.    INITIAL ASSESSMENT AND MANAGEMENT PLAN:  The patient with acute on chronic left hip pain.  She has no red flag   symptoms, has not sustained any trauma concerning for fracture. I do not   believe she requires any further workup at this time.    COURSE IN EMERGENCY DEPARTMENT:  The patient was medicated symptomatically with IM morphine.  She remained   stable throughout her stay.    DIAGNOSIS:  Left hip pain, chronic.    DISPOSITION:  The patient is discharged home in stable condition to follow up with her   orthopedist. She is to continue her home Percocet as prescribed.  Return for   worsening. The patient was personally evaluated by myself and Dr. Vinnie Langton   who agrees with the above assessment and plan.  v      ___________________  Smitty Cords MD  Dictated By: Debbora Presto. Clelia Croft, PA-C  My signature above authenticates this document and my orders, the final  diagnosis (es), discharge prescription (s), and instructions in the [facility]   Pulsecheck record.  Nursing notes have been reviewed by the physician/mid-level provider.    If you have any questions please contact [phone].    [facility]  D:09/15/2014 07:21:53  T: 09/15/2014 08:37:43  [id number]

## 2014-09-15 NOTE — ED Provider Notes (Signed)
[facility]  EMER[facility] TREATMENT REPORT  NAME:  Wysong, Dayona  SEX:   F  ADMIT: 09/15/2014  DOB:   [birth year]  MR#    [id number]  ROOM:  [id number]  TIME DICTATED: 03 57 PM  ACCT#  [id number]        ADDENDUM BY Vinnie LangtonERIK Dyasia Firestine, MD     I also examined this patient.  She had good distal sensation and motor   strength and pulses.   Has a history of sciatica.   No cauda equina symptoms   noted.        ___________________  Smitty CordsErik H Buck Mcaffee MD  Dictated By: Marland Kitchen.     My signature above authenticates this document and my orders, the final  diagnosis (es), discharge prescription (s), and instructions in the [facility]   Pulsecheck record.  Nursing notes have been reviewed by the physician/mid-level provider.    If you have any questions please contact [phone].    [facility]  D:09/15/2014 15:57:07  T: 09/15/2014 18:14:17  [id number]

## 2014-09-15 NOTE — ED Notes (Signed)
I have reviewed discharge instructions with the patient.  The patient verbalized understanding.

## 2014-09-15 NOTE — ED Notes (Signed)
Dr. kisa at bedside

## 2014-09-15 NOTE — ED Notes (Signed)
Pt states seen for the same on 03/25, but it was on the right side.

## 2014-09-15 NOTE — Progress Notes (Signed)
Code R:  Previous admission (ER only) - right hip pain - discharged 08/25/2014.  Current ER visit - right hip pain - discharged home.

## 2014-09-15 NOTE — ED Notes (Signed)
Left hip pain that worsened yesterday causing limp and tingliness down to the feet.

## 2014-09-17 NOTE — ED Provider Notes (Signed)
[facility] TREATMENT REPORT  NAME:  [name] [name], [name] [name]  SEX:   F  ADMIT: 08/24/2014  DOB:   1951/05/21  MR#    [id number]  ROOM:  [id number]  TIME DICTATED: 06 50 AM  ACCT#  [id number]        ADDENDUM BY Vinnie LangtonERIK Kyira Volkert, MD:     I also examined this patient.  She had longstanding hip pain.  She had fairly   good range of motion of the hip.  Pelvis was stable.  Good distal sensation,   motor strength and pulses noted.      ___________________  Smitty CordsErik H Olivya Sobol MD  Dictated By: Marland Kitchen.     My signature above authenticates this document and my orders, the final  diagnosis (es), discharge prescription (s), and instructions in the [facility]   Pulsecheck record.  Nursing notes have been reviewed by the physician/mid-level provider.    If you have any questions please contact [phone].    [facility]  D:09/17/2014 06:50:54  T: 09/17/2014 10:20:16  [id number]

## 2014-09-22 ENCOUNTER — Encounter

## 2014-09-22 NOTE — Telephone Encounter (Signed)
Please advice the patient if she had difficulty in finding her PCP to write prescription for pain meds i will write her prescription, since she will need total hip replacement surgery in future .

## 2014-09-22 NOTE — Telephone Encounter (Signed)
Pt states she is in severe pain but her PCP (Dr. Lorrin Jacksonherakan) is out of the office for a week so she is requesting pain med from Dr. Helyn AppAlfhad.  Pt can be contacted at [phone].

## 2014-09-22 NOTE — Telephone Encounter (Signed)
I spoke with the patient. She is confused about which physicians she is seeing and their specialities. [facility] by an orthopaedic doctor named Lendell CapriceSullivan. After extensive investigation, she is seeing a gastro specialist, Olen PelBrian Sullivan that ordered the US. I am advising for your consideration, please advise if you wish to change your plan. A F-NP is covering Dr Glynn Octaveharakan''s patients while he is out.

## 2014-09-23 ENCOUNTER — Encounter

## 2014-09-23 ENCOUNTER — Ambulatory Visit: Admit: 2014-09-23 | Discharge: 2014-09-23 | Payer: MEDICARE | Attending: Primary Care | Primary: Physician Assistant

## 2014-09-23 DIAGNOSIS — G8929 Other chronic pain: Secondary | ICD-10-CM

## 2014-09-23 MED ORDER — KETOROLAC TROMETHAMINE 60 MG/2 ML IM
60 mg/2 mL | Freq: Once | INTRAMUSCULAR | Status: AC
Start: 2014-09-23 — End: 2014-09-23

## 2014-09-23 MED ORDER — CLONIDINE 0.1 MG TAB
0.1 mg | ORAL_TABLET | Freq: Once | ORAL | Status: AC
Start: 2014-09-23 — End: 2014-09-23

## 2014-09-23 MED ORDER — OXYCODONE-ACETAMINOPHEN 10 MG-325 MG TAB
10-325 mg | ORAL_TABLET | Freq: Four times a day (QID) | ORAL | Status: DC | PRN
Start: 2014-09-23 — End: 2014-10-11

## 2014-09-23 MED ORDER — NAPROXEN 500 MG TAB
500 mg | ORAL_TABLET | Freq: Two times a day (BID) | ORAL | Status: DC
Start: 2014-09-23 — End: 2014-11-18

## 2014-09-23 MED ORDER — SEROQUEL XR 150 MG TABLET,EXTENDED RELEASE
150 mg | ORAL_TABLET | Freq: Every evening | ORAL | Status: DC
Start: 2014-09-23 — End: 2014-12-02

## 2014-09-23 NOTE — Patient Instructions (Signed)
Hip Pain: Care Instructions  Your Care Instructions  Hip pain may be caused by many things, including overuse, a fall, or a twisting movement. Another cause of hip pain is arthritis. Your pain may increase when you stand up, walk, or squat. The pain may come and go or may be constant.  Home treatment can help relieve hip pain, swelling, and stiffness. If your pain is ongoing, you may need more tests and treatment.  Follow-up care is a key part of your treatment and safety. Be sure to make and go to all appointments, and call your doctor if you are having problems. It???s also a good idea to know your test results and keep a list of the medicines you take.  How can you care for yourself at home?  ?? Take pain medicines exactly as directed.  ?? If the doctor gave you a prescription medicine for pain, take it as prescribed.  ?? If you are not taking a prescription pain medicine, ask your doctor if you can take an over-the-counter medicine.  ?? Rest and protect your hip. Take a break from any activity, including standing or walking, that may cause pain.  ?? Put ice or a cold pack against your hip for 10 to 20 minutes at a time. Try to do this every 1 to 2 hours for the next 3 days (when you are awake) or until the swelling goes down. Put a thin cloth between the ice and your skin.  ?? Sleep on your healthy side with a pillow between your knees, or sleep on your back with pillows under your knees.  ?? If there is no swelling, you can put moist heat, a heating pad, or a warm cloth on your hip. Do gentle stretching exercises to help keep your hip flexible.  ?? Learn how to prevent falls. Have your vision and hearing checked regularly. Wear slippers or shoes with a nonskid sole.  ?? Stay at a healthy weight.  ?? Wear comfortable shoes.  When should you call for help?  Call 911 anytime you think you may need emergency care. For example, call if:  ?? You have sudden chest pain and shortness of breath, or you cough up blood.   ?? You are not able to stand or walk or bear weight.  ?? Your buttocks, legs, or feet feel numb or tingly.  ?? Your leg or foot is cool or pale or changes color.  ?? You have severe pain.  Call your doctor now or seek immediate medical care if:  ?? You have signs of infection, such as:  ?? Increased pain, swelling, warmth, or redness in the hip area.  ?? Red streaks leading from the hip area.  ?? Pus draining from the hip area.  ?? A fever.  ?? You have signs of a blood clot, such as:  ?? Pain in your calf, back of the knee, thigh, or groin.  ?? Redness and swelling in your leg or groin.  ?? You are not able to bend, straighten, or move your leg normally.  ?? You have trouble urinating or having bowel movements.  Watch closely for changes in your health, and be sure to contact your doctor if:  ?? You do not get better as expected.   Where can you learn more?   Go to http://www.healthwise.net/[facility]  Enter Z720 in the search box to learn more about "Hip Pain: Care Instructions."   ?? 2006-2015 Healthwise, Incorporated. Care instructions adapted under license by [employer] (which disclaims liability or warranty for this information). This care instruction is for use with your licensed healthcare professional. If you have questions about a medical condition or this instruction, always ask your healthcare professional. Healthwise, Incorporated disclaims any warranty or liability for your use of this information.  Content Version: 10.7.482551; Current as of: April 26, 2013              Post-Traumatic Stress Disorder (PTSD): Care Instructions  Your Care Instructions  Post-traumatic stress disorder (PTSD) is a mental condition that can result from being in or seeing a traumatic or terrifying event. These events can include combat, a terrorist attack, a natural disaster, a serious accident, an assault, or a rape. If you have PTSD, you may often relive the experience in nightmares or flashbacks. These are clear and  frightening memories of the event. You may also have trouble sleeping.  PTSD affects people in very different ways. It can interfere with daily activities such as work or school, and it can make you withdraw from friends or loved ones.  Follow-up care is a key part of your treatment and safety. Be sure to make and go to all appointments, and call your doctor if you are having problems. It's also a good idea to know your test results and keep a list of the medicines you take.  How can you care for yourself at home?  ?? Take medicines exactly as directed. Call your doctor if you think you are having a problem with your medicine.  ?? Go to your counseling sessions and follow-up appointments.  ?? Recognize and accept your anxiety. Then, when you are in a situation that makes you anxious, say to yourself, "This is not an emergency. I feel uncomfortable, but I am not in danger. I can keep going even if I feel anxious."  ?? Be kind to your body:  ?? Relieve tension with exercise or a massage.  ?? Get enough rest.  ?? Avoid alcohol, caffeine, nicotine, and illegal drugs. They can increase your anxiety level and cause sleep problems.  ?? Learn and do relaxation techniques. See below for more about these techniques.  ?? Engage your mind. Get out and do something you enjoy. Go to a funny movie, or take a walk or hike. Plan your day. Having too much or too little to do can make you anxious.  ?? Keep a record of your symptoms. Discuss your fears with a good friend or family member, or join a support group for people with similar problems. Talking to others sometimes relieves stress.  ?? Get involved in social groups, or volunteer to help others. Being alone sometimes makes things seem worse than they are.  ?? Get at least 30 minutes of exercise on most days of the week. Walking is a good choice. You also may want to do other activities, such as running, swimming, cycling, or playing tennis or team sports.   ?? Keep the numbers for these national suicide hotlines: 1-800-273-TALK (425)556-6160(1-6155965107) and 1-800-SUICIDE (818) 093-1679(1-417 258 2860). If you or someone you know talks about suicide or feeling hopeless, get help right away.  Relaxation techniques  Do relaxation exercises 10 to 20 minutes a day. You can play soothing, relaxing music while you do them, if you wish.  ?? Tell others in your house that you are going to do your relaxation exercises. Ask them not to disturb you.  ?? Find a comfortable place, away from all distractions and noise.  ?? Lie down on  your back, or sit with your back straight.  ?? Focus on your breathing. Make it slow and steady.  ?? Breathe in through your nose. Breathe out through either your nose or mouth.  ?? Breathe deeply, filling up the area between your navel and your rib cage. Breathe so that your belly goes up and down.  ?? Do not hold your breath.  ?? Breathe like this for 5 to 10 minutes. Notice the feeling of calmness throughout your whole body.  As you continue to breathe slowly and deeply, relax by doing the following for another 5 to 10 minutes:  ?? Tighten and relax each muscle group in your body. You can begin at your toes and work your way up to your head.  ?? Imagine your muscle groups relaxing and becoming heavy.  ?? Empty your mind of all thoughts.  ?? Let yourself relax more and more deeply.  ?? Become aware of the state of calmness that surrounds you.  ?? When your relaxation time is over, you can bring yourself back to alertness by moving your fingers and toes and then your hands and feet and then stretching and moving your entire body. Sometimes people fall asleep during relaxation, but they usually wake up shortly afterward.  ?? Always give yourself time to return to full alertness before you drive a car or do anything that might cause an accident if you are not fully alert. Never play a relaxation tape while you drive a car.  When should you call for help?   Call 911 anytime you think you may need emergency care. For example, call if:  ?? You feel you cannot stop from hurting yourself or someone else.  Watch closely for changes in your health, and be sure to contact your doctor if:  ?? Your PTSD symptoms are getting worse.  ?? You have new or worsening symptoms of anxiety.  ?? You are not getting better as expected.   Where can you learn more?   Go to [employer]  Enter X299 in the search box to learn more about "Post-Traumatic Stress Disorder (PTSD): Care Instructions."   ?? 2006-2015 Healthwise, Incorporated. Care instructions adapted under license by [employer] (which disclaims liability or warranty for this information). This care instruction is for use with your licensed healthcare professional. If you have questions about a medical condition or this instruction, always ask your healthcare professional. Healthwise, Incorporated disclaims any warranty or liability for your use of this information.  Content Version: 10.7.482551; Current as of: April 26, 2013              Insomnia: Care Instructions  Your Care Instructions  Insomnia is the inability to sleep well. It is a common problem for most people at some time. Insomnia may make it hard for you to get to sleep, stay asleep, or sleep as long as you need to. This can make you tired and grouchy during the day. It can also make you forgetful, less effective at work, and unhappy.  Insomnia can be caused by conditions such as depression or anxiety. Pain can also affect your ability to sleep. When these problems are solved, the insomnia usually clears up. But sometimes bad sleep habits can cause insomnia.  If insomnia is affecting your work or your enjoyment of life, you can take steps to improve your sleep.  Follow-up care is a key part of your treatment and safety. Be sure to make and go to all appointments, and call your  doctor if you are having  problems. It???s also a good idea to know your test results and keep a list of the medicines you take.  How can you care for yourself at home?  What to avoid   ?? Do not have drinks with caffeine, such as coffee or black tea, for 8 hours before bed.  ?? Do not smoke or use other types of tobacco near bedtime. Nicotine is a stimulant and can keep you awake.  ?? Avoid drinking alcohol late in the evening, because it can cause you to wake in the middle of the night.  ?? Do not eat a big meal close to bedtime. If you are hungry, eat a light snack.  ?? Do not drink a lot of water close to bedtime, because the need to urinate may wake you up during the night.  ?? Do not read or watch TV in bed. Use the bed only for sleeping and sexual activity.  What to try   ?? Go to bed at the same time every night, and wake up at the same time every morning. Do not take naps during the day.  ?? Keep your bedroom quiet, dark, and cool.  ?? Get regular exercise, but not within 3 to 4 hours of your bedtime.  ?? Sleep on a comfortable pillow and mattress.  ?? If watching the clock makes you anxious, turn it facing away from you so you cannot see the time.  ?? If you worry when you lie down, start a worry book. Well before bedtime, write down your worries, and then set the book and your concerns aside.  ?? Try meditation or other relaxation techniques before you go to bed.  ?? If you cannot fall asleep, get up and go to another room until you feel sleepy. Do something relaxing. Repeat your bedtime routine before you go to bed again.  ?? Make your house quiet and calm about an hour before bedtime. Turn down the lights, turn off the TV, log off the computer, and turn down the volume on music. This can help you relax after a busy day.  When should you call for help?  Watch closely for changes in your health, and be sure to contact your doctor if:  ?? Your efforts to improve your sleep do not work.  ?? Your insomnia gets worse.   ?? You have been feeling down, depressed, or hopeless or have lost interest in things that you usually enjoy.   Where can you learn more?   Go to [employer]  Enter P513 in the search box to learn more about "Insomnia: Care Instructions."   ?? 2006-2015 Healthwise, Incorporated. Care instructions adapted under license by [employer] (which disclaims liability or warranty for this information). This care instruction is for use with your licensed healthcare professional. If you have questions about a medical condition or this instruction, always ask your healthcare professional. Healthwise, Incorporated disclaims any warranty or liability for your use of this information.  Content Version: 10.7.482551; Current as of: April 26, 2013

## 2014-09-23 NOTE — Telephone Encounter (Signed)
I spoke with the patient this morning and clarified the confusion about Dr Lendell CapriceSullivan and the study she is having today. She was also advised of Dr Overton MamAlfahd's decision with regards to her medication.  I will contact Meta HatchetBeth Porter with regards to the patient's hip injection and have her contact the patient with an appointment.

## 2014-09-23 NOTE — Progress Notes (Signed)
Progress Note  Today's Date:  09/23/2014   Patient:  [name] [name]  Patient DOB:  07-Oct-1950    Subjective:   [name] [name] is a 64 y.o. female pt of Dr. Jac Canavan who presents for follow up for Right Hip Pain. Was referred to Orthopedic and was seen by Dr. Moreen Fowler on 3/25. He recommended that she has Interarticular hip injection under floroscopy which she has not had yet. She also went to [facility] a little over a week ago because of Hip Pain. She states that she feel very bad. Is very emotional. She states that she called Dr. Annetta Maw office this morning and was told to call her PCP for pain management.       Current Outpatient Meds and Allergies     Current Outpatient Prescriptions on File Prior to Visit   Medication Sig Dispense Refill   ??? amLODIPine (NORVASC) 10 mg tablet      ??? cloNIDine HCl (CATAPRES) 0.3 mg tablet      ??? gabapentin (NEURONTIN) 600 mg tablet      ??? hydrALAZINE (APRESOLINE) 50 mg tablet      ??? metoprolol (LOPRESSOR) 100 mg tablet      ??? KLOR-CON M20 20 mEq tablet      ??? SEROQUEL XR 150 mg sr tablet      ??? ondansetron hcl (ZOFRAN) 4 mg tablet        No current facility-administered medications on file prior to visit.     These medications have been reviewed and reconciled with the patient during today's visit.      No Known Allergies    ROS:     CONST:   Denies fatigue, weight change, appetite change  NEURO:   Headaches;  no vision changes, dizziness, loss of consciousness  CV:      Denies chest pain, palpitations, orthopnea, PND  PULM: Denies SOB, wheezing, cough, hemoptysis  MS:      Right Hip pain, no joint swelling         Objective:     VS:  BP 191/137 mmHg   Temp(Src) 98.5 ??F (36.9 ??C) (Oral)   Ht  (1.702 m)   Wt 174 lb 3.2 oz (79.017 kg)   BMI 27.28 kg/m2    General:   well-nourished, well-groomed, pleasant, alert, in no acute distress.     Neck:  Neck supple with normal ROM for age, no thyromegaly,   Cardiovasc:   Regular rate and rhythm, no murmurs, no rubs, no gallops,    Pulmonary:   Clear breath sounds bilaterally, good air movement, no wheezing, no rales, no rhonchi, normal respiratory effort  Extremities:   Ambulates with cane; No edema, no tenderness with palpation of calves, warm and well-perfused  Neuro:   Alert, conversant, appropriate, following commands, no focal deficits.       Assessment:       1. Hip pain, chronic, unspecified laterality    2. Essential hypertension    3. PTSD (post-traumatic stress disorder)        Plan:       Orders Placed This Encounter   ??? REFERRAL TO PSYCHIATRY     Referral Priority:  Routine     Referral Type:  Behavioral Health     Referral Reason:  Specialty Services Required   ??? KETOROLAC TROMETHAMINE INJ (Qty 4)     Order Specific Question:  Charge Quantity?     Answer:  4     Order Specific Question:  Dose     Answer:  60 mg     Order Specific Question:  Site     Answer:  LEFT GLUTEUS     Order Specific Question:  Expiration Date     Answer:  08/12/2015     Order Specific Question:  Lot#     Answer:  [id number]     Order Specific Question:  Manufacturer     Answer:  [employer]     Order Specific Question:  Perfomed by/Witnessed by:     Answer:  Ignatius Specking, LPN/ [employer], FNP     Order Specific Question:  NDC#     Answer:  [phone]   ??? THER/PROPH/DIAG INJECTION, SUBCUT/IM   ??? oxyCODONE-acetaminophen (PERCOCET 10) 10-325 mg per tablet     Sig: Take 1 Tab by mouth every six (6) hours as needed for Pain (Max of 4 daily). Max Daily Amount: 4 Tabs.     Dispense:  60 Tab     Refill:  0   ??? naproxen (NAPROSYN) 500 mg tablet     Sig: Take 1 Tab by mouth two (2) times daily (with meals).     Dispense:  20 Tab     Refill:  0   ??? ketorolac tromethamine (TORADOL) 60 mg/2 mL soln     Sig: 2 mL by IntraMUSCular route once for 1 dose. Indications: SEVERE PAIN     Dispense:  1 Vial     Refill:  0   ??? SEROQUEL XR 150 mg sr tablet     Sig: Take 1 Tab by mouth nightly.     Dispense:  30 Tab     Refill:  0   ??? cloNIDine HCl (CATAPRES) 0.1 mg tablet      Sig: Take 3 Tabs by mouth once for 1 dose.     Dispense:  3 Tab     Refill:  0     Order Specific Question:  Expiration Date     Answer:  12/11/2014     Order Specific Question:  Lot#     Answer:  [id number]     Order Specific Question:  Manufacturer     Answer:  [employer]     Order Specific Question:  NDC#     Answer:  [phone]     BP very this high this morning, states that she did not taken any of her blood pressure medication this morning; is on 4 different agents. She c/o headache rating 8/10 but denies vision changes. States that all is due to pain. 0.2 mg of Clonidine and Ketorolac 60 mg IM x 1 given to her. States hat she has not relief; "it feel like it was water, it has not touch my pain". BP rechecked 206/141. Gave another 0.1 mg. Recheck was 190/151. Pt wants me to direct admit her to the hospital but informed her that it was beyond my capacity to do so; I offered to call 911 she declined and  Stating that they will give her what she was given in the office and nothing more. She states that she moved to [place] to take care of her 11 year old father now he is the one taking care of her. Advised her to take three of her blood pressure medication NOT Clonidine as she had it already in the office.     She states that lately, she has been very anxious, having difficulties with her PTSD. Use to see a Psychiatrist when she was in [place]. She got better and her symptoms were under control and was discharged but lately with all the problems she is having wants to be referred to one. States that "this pain  can make me jump of a building", but she denies depression, suicidal or Homicidal ideations.    I have spent over 40 minutes in face to face time with this pt in discussion with respect to the aforementioned problems, diagnoses and management plans. >50% of the time was spent counseling and coordinating care.     I have discussed the diagnosis with the patient and the intended plan as  seen in the above orders.  The patient has received an after-visit summary along with patient information handout.  I have discussed medication side effects and warnings with the patient as well. Pt verbalized understanding.    16:22- Called to check on patient; she states that she took her blood pressure and pain pills when she got home from the clinic and is feeling much better. Headache is going away.     Follow-up Disposition:  Return if symptoms worsen or fail to improve, for Follow up with Dr. Jac Canavanharakan.   Lorain ChildesRosine Y Rafaela Dinius, FNP  09/23/2014, 12:20 PM

## 2014-09-23 NOTE — Progress Notes (Signed)
[name] [name] is a 64 y.o. female is here today for right hip pain. Needs a refill on her seroquel and percocet.    Learning assessment previously completed.    1. Have you been to the ER, urgent care clinic or hospitalized since your last visit? no     2. Have you seen or consulted any other health care providers outside of the [facility] since your last visit (Include any pap smears or colon screening)? no     Do you have an Advanced Directive? yes    Would you like information on Advanced Directives? no

## 2014-09-24 NOTE — Addendum Note (Signed)
Addended by: Roseanne RenoEPPLER, AARON on: 09/24/2014 10:46 AM      Modules accepted: Level of Service

## 2014-09-26 NOTE — Progress Notes (Signed)
Refer to chart notes.

## 2014-09-30 ENCOUNTER — Inpatient Hospital Stay: Admit: 2014-09-30 | Payer: MEDICARE | Primary: Physician Assistant

## 2014-09-30 ENCOUNTER — Encounter

## 2014-09-30 DIAGNOSIS — Z01818 Encounter for other preprocedural examination: Secondary | ICD-10-CM

## 2014-09-30 MED ORDER — METHYLPREDNISOLONE 40 MG/ML SUSP FOR INJECTION
40 mg/mL | Freq: Once | INTRAMUSCULAR | Status: AC
Start: 2014-09-30 — End: 2014-09-30
  Administered 2014-09-30: 19:00:00 via INTRA_ARTICULAR

## 2014-09-30 MED ORDER — IOTHALAMATE MEGLUMINE 60 % INJECTION
60 % | Freq: Once | INTRAMUSCULAR | Status: AC
Start: 2014-09-30 — End: 2014-09-30
  Administered 2014-09-30: 19:00:00 via INTRA_ARTICULAR

## 2014-09-30 MED ORDER — LIDOCAINE HCL 1 % (10 MG/ML) IJ SOLN
10 mg/mL (1 %) | Freq: Once | INTRAMUSCULAR | Status: AC
Start: 2014-09-30 — End: 2014-09-30
  Administered 2014-09-30: 19:00:00 via INTRA_ARTICULAR

## 2014-09-30 MED ORDER — LIDOCAINE (PF) 10 MG/ML (1 %) IJ SOLN
10 mg/mL (1 %) | Freq: Once | INTRAMUSCULAR | Status: AC
Start: 2014-09-30 — End: 2014-09-30
  Administered 2014-09-30: 19:00:00 via SUBCUTANEOUS

## 2014-09-30 MED FILL — CONRAY 60 % INJECTION SOLUTION: 60 % | INTRAMUSCULAR | Qty: 2

## 2014-10-04 NOTE — Consult Note (Signed)
Brief Consult Note: Diagnosis: right lower extremity radicular symptoms.   Patient was seen by consultant.   Consult note dictated.   Comments: Symptoms are more suggestive of neurogenic etiology instead of hip pathology. Consider repeat MRI of the lumbar spine. Previous RA latex titer was elevated, although ESR today was WNL.  Electronic Signatures: Dereck Leep (MD)  (Signed 02-Jul-15 21:55)  Authored: Brief Consult Note   Last Updated: 02-Jul-15 21:55 by Dereck Leep (MD)

## 2014-10-04 NOTE — Discharge Summary (Signed)
PATIENT NAME:  Jacqueline Schultz, Jacqueline Schultz MR#:  [id number] DATE OF BIRTH:  1950/09/11  DATE OF ADMISSION:  12/11/2013 DATE OF DISCHARGE:  12/14/2013  PRIMARY CARE PHYSICIAN: Burley Saver, MD  FINAL DIAGNOSES: 1. Chronic Tylenol overdose.  2. Accelerated hypertension.  3. Hypomagnesemia, hypokalemia and hypophosphatemia.  4. Severe back pain.  5. Insomnia.   MEDICATIONS ON DISCHARGE: Include: Clonidine 0.3 mg twice a day, prednisone 5 mg 3 tablets day one, 2 tablets day two, 1 tablet days three and four, oxycodone 5 mg 2 tablets every 6 hours as needed for pain, gabapentin 400 mg 3 times a day, Seroquel 100 mg at bedtime, metoprolol tartrate 50 mg twice a day, Ambien 10 mg at bedtime as needed for insomnia, amlodipine 10 mg daily, hydralazine 100 mg 4 times a day, potassium chloride 20 mEq once a day, magnesium oxide 400 mg daily. Stop taking methadone, Procardia, Percocet.   DIET: Low sodium diet, regular consistency.   ACTIVITY: As tolerated.   FOLLOWUP:  Follow up with your pain management doctor and in 1 to 2 days with Dr. Joen Laura.   HOSPITAL COURSE: The patient was admitted 12/11/2013, discharged 12/14/2013.  1. The patient came in with head pain, found to have a toxic Tylenol level. The patient was having hip pain for a few weeks, was in the Emergency Room on June 25th, and had an aspirate of fluid around the left hip, which was negative. She ran out of her pain medications and had been taking Tylenol instead and in the ER, was found to have an elevated Tylenol level of 301. She was admitted to the hospital and put on the acetylcysteine protocol for Tylenol overdose. 2. Hypokalemia. This was replaced. 3. Chronic pain. The patient was started on IV morphine. 4. Possible urinary tract infection. Was put on Rocephin.  5. Uncontrolled hypertension. The patient was put back on her clonidine and p.r.n. hydralazine was ordered.   LABORATORY AND RADIOLOGICAL DATA DURING THE HOSPITAL COURSE:  Included: Urine culture that had mixed bacterial organisms. Magnesium was 1.6. Blood culture negative. Urine toxicology negative. Lipase 94. Urinalysis: 1+ leukocyte esterase, 3+ blood. Salicylates less than 1.7. White blood cell count 11.9, H and H 15.6 and 46.6, platelet count of 232,000. Ethanol level less than 3. Glucose 166, BUN 11, creatinine 0.82, sodium 142, potassium 2.9, chloride 105, CO2 of 30, calcium 9.1, total protein slightly elevated at 9.5. Other liver function tests normal range. Acetaminophen level on 12/11/2013, at 3:41 a.m. was 301. Acetaminophen level on 12/11/2013, at 12:20 was 63. Acetaminophen level on 12/11/2013, at  1715 hours was 8. On 12/12/2013, acetaminophen level less than 2. On July 2nd, potassium still low at 2.9. Liver function tests normal range. Total protein slightly high at 8.8. Sedimentation rate was 6, magnesium on July 2nd was 1.7, phosphorus 1.8, potassium 3.2. On the day of discharge, magnesium 1.7, phosphorus 2.8, and potassium 3.8.   HOSPITAL COURSE PER PROBLEM LIST:  1. Chronic Tylenol overdose. I believe this was from her running out of her pain medications, taking them too soon. She was then taking Tylenol instead. She was put on the acetylcysteine protocol and this was completed orally. Her Tylenol level resumed back to normal quickly.  2. Accelerated hypertension. Her blood pressure was very elevated during the entire hospital stay. I had to titrate her home clonidine up to 0.3 mg twice a day. I had to add metoprolol 50 mg twice a day, amlodipine 10 mg daily and hydralazine 100 mg 4  times a day. I only had 2 blood pressure readings that were in the normal range her entire hospital stay and both were on the day of discharge, at 127/86 upon discharge home. Close clinical followup as outpatient; may be able to cut back on medications as outpatient.  3. Hypomagnesemia, severe hypokalemia, hypophosphatemia. All of these electrolytes were replaced on a daily basis  during the entire hospital stay. Magnesium was replaced even in the lower normal range upon discharge and oral supplementation will be given at home.  4. Hypophosphatemia. Phosphate was replaced as K-Phos during the hospital course in the normal range upon discharge. Her potassium was severely low on admission and needed aggressive replacement for two days. The patient will be discharged home on oral supplementation. Her level was in the normal range upon discharge.  5. Severe back and hip pain. Dr. Ernest PineHooten consulted from orthopedics. No further imaging. Follow up as outpatient. I did start empiric Solu-Medrol just in case inflammatory and switched over to a quick prednisone taper. Since she had a Tylenol overdose, I had to switch her pain medications over to oxycodone. I gave 1 week supply of this. Follow up with Dr. Quillian QuinceBliss and her pain medication doctor as outpatient.  6. Insomnia. I did give the patient Seroquel 100 mg. She finally went to sleep last night p.r.n. Ambien.   This patient was very concerned about being able to get her pain medications, since I am making a change in her medications and removing the Tylenol. Hopefully they will fill her script. I do believe she did have a withdrawal component of what was going on here when she presented, because she was very shaky on the first day. Things have improved being on the pain medications. Follow up as outpatient with Dr. Joen LauraLaura Bliss this week coming up.   TIME SPENT ON DISCHARGE: 40 minutes.      ____________________________ Herschell Dimesichard J. Renae GlossWieting, MD rjw:lm D: 12/14/2013 14:19:53 ET T: 12/15/2013 01:49:29 ET JOB#: [id number]  cc: Herschell Dimesichard J. Renae GlossWieting, MD, <Dictator> Burley SaverL. Katherine Bliss, MD Salley ScarletICHARD J Antonae Zbikowski MD ELECTRONICALLY SIGNED 12/15/2013 14:39
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
## 2014-10-04 NOTE — H&P (Signed)
PATIENT NAME:  Jacqueline Schultz, Jacqueline Schultz MR#:  [id number] DATE OF BIRTH:  Dec 04, 1950  DATE OF ADMISSION:  11/02/2013  PRIMARY CARE PHYSICIAN: Dr. Quillian Quince.   REFERRING EMERGENCY ROOM PHYSICIAN: Dr. Manson Passey.  CHIEF COMPLAINT: Abdominal pain.   HISTORY OF PRESENT ILLNESS: The patient is a 64 year old African American female with past medical history of hypertension and obesity, who is presenting to the ER with a chief complaint of abdominal pain and right groin pain. The patient is reporting that she felt a small knot in the epigastric area approximately 2 weeks ago associated with pain. Also the patient is complaining of right groin pain for the past 3 days. The patient denies any nausea, vomiting, diarrhea, constipation. No fever. No sick contacts. She is reporting that she has chronic low back pain and that pain was radiating to left groin area chronically. Recently the left groin pain is resolved and now for the past 3 days, she is complaining of right groin pain. The patient's LFTs are normal except elevated total protein at 9.4. A CAT scan of the abdomen and pelvis with contrast has revealed dilated intra and extrahepatic bile ducts as well as dilated pancreatic duct and gallbladder. There was a concern about a stone or tiny mass at the ampulla. Patient's bilirubin and other LFTs were normal. Hospitalist team is called to admit the patient regarding possible ERCP versus MRCP. During my examination, the patient is complaining of epigastric lump and right groin pain. She denies any chest pain or shortness of breath. No similar complaints in the past. Husband is at bedside. Denies any fever. No sick contacts. No other complaints.   PAST MEDICAL HISTORY: Hypertension, obesity, chronic low back pain, and chronic left groin pain, which has resolved and now complaining of right groin pain.   PAST SURGICAL HISTORY: Hysterectomy.   ALLERGIES: No known drug allergies.   PSYCHOSOCIAL HISTORY: Lives at home with husband.  No history of smoking, alcohol, or illicit drug usage.   FAMILY HISTORY: Hypertension runs in her family.   HOME MEDICATIONS: Percocet 10/325 as needed for pain, nifedipine extended release  50 mg p.o. once daily, gabapentin 400 mg p.o. The frequency of dosing is not mentioned. Flexeril 5 mg p.o. once a day, cyclobenzaprine 10 mg p.o. 3 times a day.   REVIEW OF SYSTEMS:  CONSTITUTIONAL: Denies any fever, fatigue, weight loss, or weight gain.  EYES: Denies blurry vision, double vision. Denies any glaucoma or cataracts.  ENT: Denies epistaxis, discharge, tinnitus.  RESPIRATORY: Denies cough, COPD. No wheezing.  CARDIOVASCULAR: No chest pain, palpitations, syncope.  GASTROINTESTINAL: Complaining of epigastric lump associated with epigastric abdominal pain and right groin pain. Denies nausea, vomiting, hematemesis or melena.   GENITOURINARY: No dysuria or hematuria. GYNECOLOGICAL: Denies vaginal discharge status post a hysterectomy.  ENDOCRINE: Denies polyuria, nocturia, thyroid problems.  HEMATOLOGIC AND LYMPHATIC: No anemia, easy bruising, bleeding.  INTEGUMENTARY: No acne, rash, or lesions.  MUSCULOSKELETAL: No joint pain in the neck. Denies gout. Has chronic low back pain and left groin pain, currently with right groin pain.  NEUROLOGIC: Denies any vertigo, ataxia, dementia.  PSYCHIATRIC: No ADD, OCD, insomnia.  PHYSICAL EXAMINATION:  VITAL SIGNS: Temperature 98.1, pulse 96, respirations 16, blood pressure 143/91, pulse oximetry was 93%.  GENERAL APPEARANCE: Not in any acute distress, obese. HEENT: Normocephalic, atraumatic. Pupils are equally reacting to light and accommodation. No scleral icterus. No conjunctival injection. No sinus tenderness. No postnasal drip.  NECK: Supple. No JVD. No thyromegaly. Range of motion is intact.  LUNGS:  Clear to auscultation bilaterally. No accessory muscle usage. No anterior chest wall tenderness on palpation.  CARDIAC: S1 and S2 normal. Regular rate  and rhythm. No murmur.  GASTROINTESTINAL: Soft. Bowel sounds are positive in all 4 quadrants, +3 epigastric tenderness is present. Mild superficial lump is noted in the epigastric area which is approximately 2 x 2 cm in size, firm in consistency, freely mobile which could be a fat pad. No rebound tenderness. No masses felt other than epigastric superficial 2 x 2 lump.  EXTREMITIES: No edema. No cyanosis. No clubbing.  SKIN: Warm to touch. Normal turgor. No rashes, no lesions. MUSCULOSKELETAL: No join effusion tenderness. PSYCHIATRIC: Normal mood and affect.   LABORATORIES AND IMAGING STUDIES: CAT scan of the abdomen and pelvis with contrast has revealed dilated intra and extrahepatic bile ducts and dilated pancreatic duct and gallbladder. The possibility of a stone or tiny mass at the ampulla should be considered if the patient's bilirubin is normal.   Emphysema. Ill-defined area of probable inflammation at the left lung base but there is a vague nodularity on image number 5.  Recommending followup limited slice CAT scan without contrast in 1 month to ensure clearing of stability of the left lung base nodularity.  Bilateral renal calculi.   The patient's CBC is normal. Troponin is normal. LFTs are normal except total protein at 9.4. BMP: Glucose 127, BUN and creatinine are normal. Sodium 130, potassium 3.9, chloride 65, CO2 of 30, GFR greater than 60. Anion gap 5, serum osmolality and calcium are normal. Lipase is at 58. Urinalysis: Amber color, cloudy in appearance, glucose is 50 mg/dL   ASSESSMENT AND PLAN: A 64 year old African American female who came into the ER with a chief complaint of epigastric lump and pain in the epigastric area for 2 weeks and right groin pain for the past 3 days will be admitted with the following assessment and plan:   1. Acute epigastric abdominal pain with dilated pancreatic duct. Normal LFTs. Could be stone or mass. We will keep her n.p.o., provide IV fluids. GI  consult is placed to Dr. Niel HummerIftikhar as the patient was seen by him before. We will consider getting ERCP.  2. Right groin pain for 3 days. Etiology could be from muscle strain or sprain. The etiologies could not be elicited.  3. Questionable lung nodularity. Repeat CAT scan of the chest is recommended to make sure the shadow or the nodularity is clear. The patient can get it done as an outpatient. PCP to follow up on this.  4. Glucosuria. Will check hemoglobin A1c. The patient has no known diabetes.  5. Hypertension. The patient is currently n.p.o. and hold off on the p.o. medications including blood pressure medication as the blood pressure is low. 6. Obesity. The patient will benefit with a diet and exercise once the patient is clinically stable.  7. Possible acute cystitis. We will get urine culture and provide IV levofloxacin.  8. Will provide gastrointestinal and deep vein thrombosis prophylaxis.  Diagnosis and plan of care was discussed in detail the patient and her husband at bedside. They both expressed understanding of the plan.     ____________________________ Ramonita LabAruna Ashanta Amoroso, MD ag:lt D: 11/02/2013 05:29:00 ET T: 11/02/2013 05:59:07 ET JOB#: [id number]  cc: Burley SaverL. Katherine Bliss, MD Ramonita LabAruna Harless Molinari, MD, <Dictator>   Ramonita LabARUNA Micco Bourbeau MD ELECTRONICALLY SIGNED 11/14/2013 7:58

## 2014-10-04 NOTE — H&P (Signed)
PATIENT NAME:  Jacqueline Schultz, Jacqueline R MR#:  Schultz DATE OF BIRTH:  1950/10/31  DATE OF ADMISSION:  12/11/2013  REFERRING PHYSICIAN: [employer] SinkJade J. Dolores FrameSung, MD   PRIMARY CARE PHYSICIAN: Burley SaverL. Katherine Bliss, MD  CHIEF COMPLAINT: Head pain, found to have toxic Tylenol level.   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 The patient reports she has been having hip pain for the last few weeks. Was recently in the ED on June 25th, where she had aspirate of fluid around the left hip, which was negative. The patient was discharged home thereafter. The patient presents today again complaining of left hip pain. On her basic workup, the patient was found to have toxic Tylenol level at 301. Her LFTs were within normal limits. The patient denies any intentional overdose. 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 Her urine drug screen was negative as well. As well, the patient's workup was significant for hypokalemia with potassium at 2.3. The patient denies any chest pain, any shortness of breath, any fever, any chills. Denies any suicidal ideation or thoughts. Reports she was taking on average 2 pills every 4 to 6 hours over the last few days. As well, the patient had positive urinalysis, and her blood pressure was uncontrolled. It seems she ran out of her blood pressure medicine as well.   PAST MEDICAL HISTORY:  1. Hypertension.  2. Chronic lower back pain.  3. Chronic left groin pain.  4. Obesity.  PAST SURGICAL HISTORY: Hysterectomy.  ALLERGIES: No known drug allergies.  SOCIAL HISTORY: The patient lives at home with her husband. No alcohol. No smoking. No illicit drug use.  FAMILY HISTORY: Significant for hypertension in the family.   HOME MEDICATIONS:  1. Procardia extended release 60 mg oral daily.  2. Catapres  0.1 mg oral 1 tablet 2 times a day.  3. She is out of her methadone 10 mg oral daily.  4. As well, she is out of her Percocet 10/325 every 6 hours. She has not been taking them for a few days now.   REVIEW OF SYSTEMS:    DICTATION ENDS HERE    ____________________________ Starleen Armsawood S. Kadie Balestrieri, MD dse:lb D: 12/11/2013 06:43:05 ET T: 12/11/2013 07:00:22 ET JOB#: [id number]  cc: Starleen Armsawood S. Gavrielle Streck, MD, <Dictator> Jovonda Selner Teena IraniS Miracle Mongillo MD ELECTRONICALLY SIGNED 12/20/2013 0:50

## 2014-10-04 NOTE — Consult Note (Signed)
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 No mass or stone.   Recs: ERCP tomorrow - npo midnight - no am lovenox or heparin.   Electronic Signatures: Dow Adolphein, Genevieve Arbaugh (MD)  (Signed [phone]-May-15 16:24)  Authored: Details   Last Updated: 24-May-15 16:24 by Dow Adolphein, Rasheedah Reis (MD)

## 2014-10-04 NOTE — Discharge Summary (Signed)
PATIENT NAME:  Jacqueline Schultz, Jacqueline Schultz MR#:  [id number] DATE OF BIRTH:  1950/10/14  DATE OF ADMISSION:  11/02/2013 DATE OF DISCHARGE:  11/05/2013   ADMISSION DIAGNOSIS: Abdominal pain, hip pain.   DISCHARGE DIAGNOSES:  1. Hip pain due to osteoarthritis.  2. Acute epigastric abdominal pain.  3. Ampulla of Vater stricture, status post endoscopic retrograde cholangiopancreatography. 4. Questionable lung nodules. Repeat CT scan recommended in the next month. Please follow up on this with Dr. Quillian Quince.  5. Glucosuria with normal hemoglobin A1c.  6. Hypertension, accelerated, uncontrolled. 7. The patient came with a possible diagnosis of urinary tract infection, but this was ruled out.   DISPOSITION: Home with home health.   MEDICATIONS AT DISCHARGE:  1. Cyclobenzaprine 10 mg 3 times a day. 2. Percocet 10/325 mg every 4 to 6 hours as needed for pain. The patient does have a prescription at home.  3. Nifedipine 60 mg once a day.  4. Gabapentin 400 mg 3 times daily.  5. Indomethacin 25 mg 3 times a day with meals for 2 weeks.  6. Lisinopril 20 mg 2 tablets so a total of 40 mg once a day.  7. Metoprolol 50 mg twice daily. 8. Famotidine 150 mg twice daily. This is to prevent GI upset while taking indomethacin. 9. Hydralazine 20 mg every 8 hours.   FOLLOWUP:  Dr. Francesco Sor, orthopedics, for evaluation and treatment of osteoarthritis of the hip.  Dr. Quillian Quince for followup on the following issues:  1. Monitor kidney function as the patient is on NSAIDs now.  2. Control of pain.  3. Follow up on blood pressure management as there have been multiple changes of her medications, and the patient still has uncontrolled hypertension.  4. Follow up on CT scan of the chest, needs to be ordered in the next 1 to 2 months to re-evaluate possible nodules.  5. Also, I have gone tests in the hospital that are not back, and those are rheumatoid factor and antinuclear antibody panel. Please follow up on these results  outpatient as well as the cytology reports from ERCP.   IMPORTANT RESULTS: Potassium 3.3 at discharge, replaced. Glucose 127, hemoglobin A1c 6.5. Lipase 58. LFTs were normal with alkaline phosphatase of 66, AST 36, bilirubin 0.6. Troponin 0.02. White count 9.2, hemoglobin 14.7. Urinalysis: 23 white blood cells. Urine culture: Mixed flora, rule out UTI.   MRCP showed:  1. Extrahepatic biliary ductal dilation as well as dilation of the main pancreatic duct with abrupt tapering in the level of the ampulla. No evidence of choledocholithiasis or obstructing mass.  2. Mild splenomegaly.  3. A small renal cyst.   CT scan of the abdomen showed:  1. Dilated intra- and extrahepatic bile ducts and pancreatic duct and gallbladder.  2. Emphysema with ill-defined areas, probable inflammation, at the level of the left lung base.  3. There is vague nodularity present for which it is recommended to follow up limited CT chest without contrast in a month.  4. Renal calculi were seen.   MRI of the hip: Bilateral osteoarthritis of the hips, left worse than right, mild marrow edema in the right anterior acetabulum which might be reactive versus secondary to trauma or stress related to injury. There is no linear component that suggests fracture. A small right hip joint effusion which is nonspecific. If there is clinical concern regarding infection, recommend arthrocentesis. At this moment, we do not have any clinical concern. No avascular necrosis.   Ultrasound of the groin showed no acute  or suspicious findings. The small lymph nodes in the right groin are not pathologically enlarged.   HOSPITAL COURSE: A 64 year old female with history of hypertension, who comes to the Emergency Department with a main complaint of hip pain. The patient was having groin pain for 2 weeks with significant difficulty ambulating, also vague epigastric pain. The patient was evaluated in the Emergency Department. LFTs were normal, but a CT  scan was done showing intra- and extrahepatic bile duct dilation and pancreatic duct dilation as well as gallbladder dilation. The patient was admitted for evaluation of this concern as well as the hip pain.   As per problems:  1. Hepatic tree dilation. The patient underwent an MRCP that showed a stricture at the level of the ampulla. Possibility of a small tumor versus scar tissue. The patient underwent an ERCP. ERCP was done without any problems. No stent needed. Dilation was done without problems. No evidence of masses or malignancy at this moment, although cytology was sent and needs to be followed up as an outpatient. The patient did not have any symptoms. The patient did not have any complications. She is able to be discharged in good condition.  2. Right groin pain. The patient had pain that has been severe, with difficulty ambulating. Physical therapy is recommended. Dan HumphreysWalker is recommended. Weight-bearing as tolerated. Since there are some signs of inflammation on the CT scan and the x-rays, we went ahead and sent a rheumatoid factor as well as antinuclear antibody. I do not have those results by discharge, but they need to be followed by primary care physician. The patient is going to be following with Dr. Ernest PineHooten for the possibility of nonsurgical treatment. Physical therapy recommended. The patient is discharged on NSAIDs with indomethacin. Famotidine is given to counteract the gastric irritation.  3. The patient has questionable lung nodularity. Needs a CT scan to be done in a month or two. This also needs to be ordered by Dr. Quillian QuinceBliss.  4. As far as her hypertension, it has been accelerated, uncontrolled. We have started her on lisinopril 40 mg, metoprolol 50 mg twice daily and hydralazine 20 mg every 8 hours. The patient needs to check her blood pressure twice daily and bring blood pressure log to Dr. Quillian QuinceBliss. 5. The patient seemed to have, on admission, white blood cells in urine, started on  levofloxacin. Urine cultures came back as a mixed bacteria. We stopped levofloxacin, and UTI was ruled out.   CONDITION: The patient is discharged in good condition.   TIME SPENT: I spent about 45 minutes discharging this patient.   ____________________________ Felipa Furnaceoberto Sanchez Gutierrez, MD rsg:lb D: 11/05/2013 13:09:03 ET T: 11/05/2013 13:27:00 ET JOB#: [id number]  cc: Felipa Furnaceoberto Sanchez Gutierrez, MD, <Dictator> Illene LabradorJames P. Angie FavaHooten Jr., MD L. Clayborn BignessKatherine Bliss, MD Regan RakersOBERTO Juanda ChanceSANCHEZ GUTIERRE MD ELECTRONICALLY SIGNED 11/16/2013 12:54

## 2014-10-04 NOTE — Consult Note (Signed)
Brief Consult Note: Diagnosis: CBD and PD dilation.   Patient was seen by consultant.   Consult note dictated.   Recommend further assessment or treatment.   Orders entered.   Comments: 1.) CBD and PD dilation:  no liver enzyme abnormality, no symtpoms related to this (only pain is R groin pain). No n/v, weight loss, f/c.  - will obtain MRCP to further establish  cause of this - will likely need EUS as outpatient. - no indiaction for ERCP unless MRCP shows stone in distal CBD.   2.) R Groin pain: aggrevated with motion, getting out of bed, lifting leg. This is musculoskeletal in nature.   - Rec course of NSAIDS.  Electronic Signatures: Dow Adolphein, Valeen Borys (MD)  (Signed 23-May-15 15:15)  Authored: Brief Consult Note   Last Updated: 23-May-15 15:15 by Dow Adolphein, Cherell Colvin (MD)

## 2014-10-04 NOTE — Consult Note (Signed)
DATE OF ADMISSION:  11/02/2013  PRIMARY CARE PHYSICIAN: Dr. Quillian QuinceBliss.  QUESTION: Right Hip pain TIME: 10:30am on 11/04/2013  OF PRESENT ILLNESS: The patient is a 64 year old woman with past medical history of hypertension and obesity, who is presenting to the ER with a chief complaint of abdominal pain and right groin pain. Pt states that 2 weeks ago she worked out at [employer] engaging in the excercise bike as well as walking on a tredmill, after that she started to experience groin pain and increased pain with wieght bearing on the Right leg.  She treated herslef with rest and percocet and flexeril without improvement. Better with rest and worse with weight bearing. rated 8/10 and sharp. Worse with movement. No radicular pain, no changes in bowel or bladder.   MEDICAL HISTORY: Hypertension, obesity, chronic low back pain, and chronic left groin pain, which has resolved and now complaining of right groin pain.  SURGICAL HISTORY: Hysterectomy.  No known drug allergies.  HISTORY: Lives at home with husband. No history of smoking, alcohol, or illicit drug usage.  HISTORY: Hypertension runs in her family.  MEDICATIONS: Percocet 10/325 as needed for pain, nifedipine extended release  50 mg p.o. once daily, gabapentin 400 mg p.o.Flexeril 5 mg p.o. once a day, cyclobenzaprine 10 mg p.o. 3 times a day.  OF SYSTEMS: Denies any fever, fatigue, weight loss, or weight gain. Denies blurry vision, double vision. Denies any glaucoma or cataracts. Denies epistaxis, discharge, tinnitus. Denies coughNo chest pain+ abdominal pain and right groin pain.   No dysuria or hematuria.Denies polyuriaAND LYMPHATIC: No easy bruisingright groin painDenies dementia  EXAMINATION: SIGNS: AFVSSAPPEARANCE: A and Ox3 nadPERRLASuppleNon labored respirationsRegular rateLOWER EXTREMITIE: Intact sensation L3-S1 to light touch. No pain on palpation opf ankle foot, knee. Pt has pain with Flexion and internal rotation at 90 degrees, improved  with external rotation;  Able to perform compelte ROM of him, no Greater troch pain STUDIES: A/P Pelvis non weight bearing XR demonstrates moderate OA of b/l hips AND PLAN: A 64 year old woman with Right hip pain for 2 weeks worse with weight bearing.Standing A/P pelvis XRMRI of Right hip to evaluate for insufficiency fracture of the femoral neckTTWB RLEPT/OTFuture treatment will be predicated on the results of the MRI of the Right Hip which may include non operative treatment with NSADIS, intrarticular steroid injection or if femoral neck insuffiency fx consider cannulated screw fixation and plan of care was discussed in detail the patient.    Electronic Signatures: Weyman PedroGallizzi, Mila Pair A (MD)  (Signed on 25-May-15 11:32)  Authored  Last Updated: 25-May-15 11:32 by Weyman PedroGallizzi, Ophia Shamoon A (MD)

## 2014-10-04 NOTE — Consult Note (Signed)
PATIENT NAME:  Jacqueline Schultz, TOVES MR#:  [id number] DATE OF BIRTH:  06-Sep-1950  DATE OF CONSULTATION:  11/02/2013  CONSULTING PHYSICIAN:  Dow Adolph, MD  REFERRING PHYSICIAN: Dr. Amado Coe.  REASON FOR CONSULTATION: Dilated pancreatic and common bile duct on CT scan.   HISTORY OF PRESENT ILLNESS: Ms. Jacqueline Schultz is a 64 year old female with a past medical history notable for hypertension, obesity, chronic groin pain, who presented to the Emergency Room for evaluation of right groin pain. Normally she has pain in her left groin. However, over about the past 2 weeks, she has noticed a pain in her right groin. This did start after she was riding a bike and walking on a treadmill at the gym. This pain is much worse after she moves her right leg or after she moves such as trying to get out of the bed.   She is not having any abdominal pain whatsoever. She does note a subcutaneous nodule just above the umbilicus. Other than this she is not having any abdominal pain. No nausea, no vomiting, no weight loss. No rectal bleeding. No melena.   PAST MEDICAL HISTORY:  1. Hypertension.  2. Obesity.  3. Chronic back pain.  4. Chronic left groin pain.   PAST SURGICAL HISTORY: Hysterectomy.   ALLERGIES: NKDA.   SOCIAL HISTORY: She denies any tobacco or alcohol to me.   FAMILY HISTORY: No family history of GI malignancy.   HOME MEDICATIONS:  1. Percocet p.r.n.  2. Nifedipine 50 mg daily.  3. Gabapentin 400 mg.  4. Flexeril 5 mg daily.  5. Cyclobenzaprine 10 mg t.i.d.  REVIEW OF SYSTEMS: A 10-system review was conducted. It is negative except as stated in the HPI.   PHYSICAL EXAMINATION:  VITAL SIGNS: Temperature is 97.9. Pulse is 93, respirations are 18, blood pressure 133/81, pulse oximetry is 91% on room air.  GENERAL: Alert and oriented times 4.  No acute distress. Appears stated age. HEENT: Normocephalic/atraumatic. Extraocular movements are intact. Anicteric. NECK: Soft, supple. JVP appears normal. No  adenopathy. CHEST: Clear to auscultation. No wheeze or crackle. Respirations unlabored. HEART: Regular. No murmur, rub, or gallop.  Normal S1 and S2. ABDOMEN: Soft, nondistended.  Normal active bowel sounds in all four quadrants.  No organomegaly. No masses. Some mild tenderness to palpation in the right groin. No hernia is appreciated.  EXTREMITIES: No swelling, well perfused. SKIN: No rash or lesion. Skin color, texture, turgor normal. NEUROLOGICAL: Grossly intact. PSYCHIATRIC: Normal tone and affect. MUSCULOSKELETAL: No joint swelling or erythema.   LABORATORY DATA: Sodium 130, potassium 3.9, creatinine 0.69, BUN 11, lipase is normal. Her liver enzymes are normal. Cardiac enzymes normal. White count is 9.2, hemoglobin 14, hematocrit 45, platelets are 262,000.   She did have a CT scan which did show dilation of both the common bile duct which is 15 mm and also pancreatic duct dilation to 6 mm, which both extend down to the ampulla.   ASSESSMENT AND PLAN:  1. Pancreatic and common bile duct dilation. No etiology for this was seen on the CT scan. I would like to go ahead and obtain an MRCP to evaluate for stone versus mass, which could be potentially causing this dilation. Depending on the MRCP results, she will need likely need an EUS as an outpatient to further investigate this. The only way an ERCP would be indicated is if she has evidence of a stone on the MRCP. However, given the lack of abdominal pain and the normal liver enzymes, this procedure would not  need to be done as an inpatient. 2. Right groin pain. This pain occurred after she was working out at [employer] gym and is very much motion related. I do strongly suspect that this is a musculoskeletal injury and would recommend a course of NSAIDs versus muscle relaxants.   Thank you for this consultation.    ____________________________ Dow AdolphMatthew Rein, MD mr:lt D: 11/02/2013 16:02:17 ET T: 11/02/2013 20:10:31 ET JOB#: [id number]  cc: Dow AdolphMatthew  Rein, MD, <Dictator> Kathalene FramesMATTHEW G REIN MD ELECTRONICALLY SIGNED 11/13/2013 11:42

## 2014-10-04 NOTE — H&P (Signed)
PATIENT NAME:  Jacqueline Schultz, Jacqueline Schultz MR#:  [id number] DATE OF BIRTH:  March 29, 1951   DATE OF ADMISSION:  12/11/2013.  ADDENDUM:  This is a continuation.   REVIEW OF SYSTEMS: CONSTITUTIONAL: The patient denies fever, chills, fatigue, weakness.  EYES: Denies blurry vision, double vision, inflammation.  EARS, NOSE, THROAT: Denies ear pain, hearing loss, epistaxis.  RESPIRATORY: Denies cough, wheezing, hemoptysis, dyspnea.  CARDIOVASCULAR: Denies chest pain, edema, arrhythmia, palpitation.  GASTROINTESTINAL: Denies nausea, vomiting, diarrhea, abdominal pain, hematemesis.  GENITOURINARY: Denies dysuria, hematuria, renal colic. ENDOCRINE: Denies polyuria, polydipsia, heat or cold intolerance.  HEMATOLOGY: Denies anemia, easy bruising.  INTEGUMENT: Denies acne, rash.  MUSCULOSKELETAL: Complains of left hip pain. Reports history of arthritis and chronic lower back pain.  NEUROLOGIC: Denies CVA, transient ischemic attack, ataxia, tremors, vertigo.  PSYCHIATRIC: Denies anxiety, insomnia, or depression.   PHYSICAL EXAMINATION:  VITAL SIGNS: Temperature 98.4, pulse 98, respiratory rate 20, blood pressure 184/115, oxygen saturation 98% on room air, on presentation, blood pressure was 220/139.  GENERAL: Elderly female, looks comfortable, in no apparent distress.  HEENT: Head is atraumatic, normocephalic. Pupils equal, reactive to light. Pink conjunctivae. Anicteric sclerae. Moist oral mucosa.  NECK: Supple. No thyromegaly. No JVD.  CHEST: Good air entry bilaterally. No wheezing, rales, rhonchi.  CARDIOVASCULAR: S1, S2 heard. No. No rubs, murmur or gallops.  ABDOMEN: Soft, nontender, nondistended. Bowel sounds present.  EXTREMITIES: No edema. No clubbing. No cyanosis. Pedal pulses felt bilaterally.  PSYCHIATRIC: Appropriate affect. Awake, alert x3. Intact judgment and insight.  NEUROLOGIC: Cranial nerves grossly intact. Motor 4/5. No focal deficits.  MUSCULOSKELETAL: No joint effusion or erythema.    PERTINENT LABORATORY DATA: Glucose 166, BUN 11, creatinine 0.82, sodium 142, potassium 2.9, chloride 105, CO2 30. ALT 43, AST 22, alkaline phosphatase 92. White blood cells 11.9, hemoglobin 15.6, hematocrit 46.6, platelets 232,000.   Urinalysis showing white blood cells, +1 leukocyte esterase, acetaminophen serum 301, salicylate less than 1.7.  Urine drug screen and negative for the entire panel.   ASSESSMENT AND PLAN:  1.  Tylenol overdose. 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 She has been using it exclusively over the last couple of weeks as she reports she ran out of her Percocet. The patient will be admitted to treat her Tylenol overdose. We will start her on p.o. Mucomyst protocol, have requested pharmacy to dose it.  She already received a dose in the Emergency Department, currently she has normal LFTs. We will check her LFTs at 24 hours. We will continue with aggressive IV hydration.  2.  Hypokalemia. Will be replaced. Will check a level at 24 hours.  3.  Chronic pain syndrome. The patient will be started on p.Schultz.n. morphine for pain.  4.  Urinary tract infection. Continue with Rocephin.  5.  Uncontrolled hypertension, most likely due to the patient running out of her medicines. The patient will be resumed back on her medicine. As well, we will add p.Schultz.n. hydralazine for her.  6.  Deep vein thrombosis prophylaxis. Subcutaneous heparin.   CODE STATUS: Full code.   TOTAL TIME SPENT ON ADMISSION AND PATIENT CARE: 55 minutes.    ____________________________ Starleen Armsawood S. Kinisha Soper, MD dse:lt D: 12/11/2013 06:49:00 ET T: 12/11/2013 07:03:41 ET JOB#: [id number]  cc: Starleen Armsawood S. Jaryah Aracena, MD, <Dictator> Viktor Philipp Teena IraniS Emoree Sasaki MD ELECTRONICALLY SIGNED 12/20/2013 0:50

## 2014-10-06 ENCOUNTER — Encounter: Attending: Family Medicine | Primary: Physician Assistant

## 2014-10-08 ENCOUNTER — Encounter: Attending: Family Medicine | Primary: Physician Assistant

## 2014-10-10 NOTE — Telephone Encounter (Signed)
Reminder call for patient's ordered mammogram.

## 2014-10-11 ENCOUNTER — Inpatient Hospital Stay
Admit: 2014-10-11 | Discharge: 2014-10-12 | Disposition: A | Discharge status: Home / Self Care | Payer: MEDICARE | Attending: Emergency Medical Services

## 2014-10-11 DIAGNOSIS — M25551 Pain in right hip: Secondary | ICD-10-CM

## 2014-10-11 NOTE — ED Notes (Signed)
Right hip pain

## 2014-10-12 MED ORDER — OXYCODONE-ACETAMINOPHEN 5 MG-325 MG TAB
5-325 mg | ORAL_TABLET | ORAL | Status: DC | PRN
Start: 2014-10-12 — End: 2014-12-02

## 2014-10-12 MED ORDER — MORPHINE 4 MG/ML SYRINGE
4 mg/mL | Freq: Once | INTRAMUSCULAR | Status: AC
Start: 2014-10-12 — End: 2014-10-11
  Administered 2014-10-12: 01:00:00 via INTRAMUSCULAR

## 2014-10-12 MED ORDER — KETOROLAC TROMETHAMINE 30 MG/ML INJECTION
30 mg/mL (1 mL) | INTRAMUSCULAR | Status: AC
Start: 2014-10-12 — End: 2014-10-11
  Administered 2014-10-12: 01:00:00 via INTRAMUSCULAR

## 2014-10-12 MED FILL — KETOROLAC TROMETHAMINE 30 MG/ML INJECTION: 30 mg/mL (1 mL) | INTRAMUSCULAR | Qty: 1

## 2014-10-12 MED FILL — MORPHINE 4 MG/ML SYRINGE: 4 mg/mL | INTRAMUSCULAR | Qty: 2

## 2014-10-12 NOTE — ED Provider Notes (Signed)
[facility]  EMERGENCY DEPARTMENT TREATMENT REPORT  NAME:  [name], [name]  SEX:   F  ADMIT: 10/11/2014  DOB:   02-Dec-1950  MR#    [id number]  ROOM:  [id number]  TIME DICTATED: 09 03 PM  ACCT#  [id number]    cc: Lollie MarrowJolson Tharakan MD    CHIEF COMPLAINT:  Right hip pain.    HISTORY OF PRESENT ILLNESS:  A 64 year old female with a chronic history of right hip pain that has been   ongoing for years.  It had been intermittent, but over the past 2 years, it   has been progressively getting worse.  She recently moved here and is now   being followed by Dr. Jac Canavanharakan and a new orthopedist at [facility].  She recently had some injections.  These do not seem to   relieve her.  She is not in pain management here, but was in pain management   [place].  She is on gabapentin for her pain.  There has really   not been any change in the distribution of her discomfort or in her symptoms,   only the fact that she has run out of her Percocet and is having increasing   pain since being out of the Percocet since Thursday.  She missed her   appointment with her primary care doctor for which she would have gotten that   refilled.  She denies any fevers or chills.  She states she just feels very   uncomfortable.  She has no urinary retention or incontinence.  No history of   bowel motility difficulty or saddle anesthesia.  She reports that she has   degenerative disk disease of her back as well as no cartilage in her right   hip, and so she needs both a  hip replacement and she has degenerative disk   disease with radiculopathy.    REVIEW OF SYSTEMS:    MUSCULOSKELETAL:  Right hip pain with radicular symptoms down her right leg.    NEUROLOGICAL:  She reports numbness and tingling going down her right leg   which is chronic.    Denies complaints in all other systems.     PAST MEDICAL HISTORY:  She has a history of chronic pain, hypertension, GERD and some PTSD.      PAST SURGICAL HISTORY:   She has had prior gynecologic surgery.  She has actually had a spinal   stimulator, but that had to get removed because it was unsuccessful, that was   while in [place].    SOCIAL HISTORY:  Nonsmoker, no alcohol abuse, no drug abuse.    PHYSICAL EXAMINATION:  VITAL SIGNS:  Temperature 99.2, heart rate is 106, respiratory rate 22,  blood   pressure 185/128, oxygen saturation 99% on room air.  GENERAL APPEARANCE:  This is a well-developed, well-nourished female who is   resting on the stretcher.  She appears uncomfortable.  She is lying on her   left side rubbing her right hip upon my arrival.    MUSCULOSKELETAL:  The patient has full range of motion to the hip joint.    There does not appear to be significant irritability at the hip joint,   although internal and external rotation seem to reproduce the pain at the hip   joint.  She has some tenderness along palpation of the greater trochanter,   also in the gluteus.  On palpation of the spine, she has no significant  thoracic or lumbar spine tenderness.  She has some mild tenderness at the   lower lumbar region and over the SI joint.  She has no swelling or fluid   collections and no erythema.  Her joint mobility is good.  Her sensation is   intact distally.  She can feel the same sensation throughout her foot, calf   and  thigh on gross testing.  Her strength is equal.  Flexion and extension,   she is able to also flex and extend the hip with limited change in range of   motion.  She has good DP pulses as well and good capillary refill to the foot.    INITIAL ASSESSMENT AND MANAGEMENT PLAN:   A 64 year old female presenting with exacerbation of chronic pain to the right   hip, being out of  Percocet.  I have advised the patient that it is our   policy not to write opiates for chronic pain; however, I will do this on a   single visit for the patient.  I will give her some Toradol and  morphine to    help with her discomfort.  I have advised the patient that she should follow   up with her primary care doctor as well as her orthopedist.  I suspect that   her hypertension and tachycardia are related to her degree of pain.  We will   have her recheck vital signs, will administer Toradol as well as morphine.  I   do not think that this patient has a septic joint, as the patient has good   range of motion and she denies any fevers or chills.  She does not have any   chest pain, shortness of breath or other associated symptoms.    EMERGENCY DEPARTMENT DIAGNOSES:  1.  Acute on chronic right hip pain.  2.  Degenerative disk disease in the lumbar region.    PLAN:  As above.     The patient was personally evaluated by myself and Jannetta Quint, MD who   agrees with the above assessment and plan.      ___________________  Lucio Edward MD  Dictated By: Marland Kitchen     My signature above authenticates this document and my orders, the final   diagnosis (es), discharge prescription (s), and instructions in the Epic   record.  If you have any questions please contact [phone].    Nursing notes have been reviewed by the physician/ advanced practice   clinician.    PB  D:10/11/2014 21:03:04  T: 10/12/2014 01:28:02  [id number]

## 2014-10-15 ENCOUNTER — Ambulatory Visit
Admit: 2014-10-15 | Discharge: 2014-10-15 | Payer: MEDICARE | Attending: Orthopaedic Surgery | Primary: Physician Assistant

## 2014-10-15 DIAGNOSIS — M160 Bilateral primary osteoarthritis of hip: Secondary | ICD-10-CM

## 2014-10-15 MED ORDER — OXYCODONE-ACETAMINOPHEN 5 MG-325 MG TAB
5-325 mg | ORAL_TABLET | Freq: Four times a day (QID) | ORAL | Status: DC | PRN
Start: 2014-10-15 — End: 2014-12-02

## 2014-10-15 NOTE — Progress Notes (Signed)
FOLLOW UP VISIT NOTE    [name] [name]  05/21/1951   Chief Complaint   Patient presents with   ??? Hip Pain     R>L        HISTORY OF PRESENT ILLNESS  [name] ShutterMonica R [name] is a 64 y.o. female who presents today for evaluation and follow up after right hip injection, she had very good relief for one week from her Groin and buttock pain, however her back pain was there with no change, but she was happy if she can get rid of her hip pain, so today we did talk  About right hip replacement surgery. And she want to proceed with surgery.     No Known Allergies     Past Medical History   Diagnosis Date   ??? Chronic pain    ??? Hypertension    ??? GERD (gastroesophageal reflux disease)    ??? PTSD (post-traumatic stress disorder)      lived through [place]        REVIEW OF SYSTEM:  NO Change since the last Visit.    PHYSICAL EXAM:   BP 140/99 mmHg   Pulse 59   Ht 5\' 6"  (1.676 m)   Wt 177 lb (80.287 kg)   BMI 28.58 kg/m2  Psychiatric: Affect and mood are appropriate.   Physical Exam   Constitutional: She is oriented to person, place, and time and well-developed, well-nourished, and in no distress. No distress.   Neck: Normal range of motion.   Musculoskeletal: She exhibits edema and tenderness.        Right hip: She exhibits decreased range of motion, decreased strength, tenderness and crepitus. She exhibits no bony tenderness, no swelling, no deformity and no laceration.        Left hip: She exhibits decreased range of motion, decreased strength and tenderness. She exhibits no swelling, no crepitus, no deformity and no laceration.   Neurological: She is alert and oriented to person, place, and time. She exhibits normal muscle tone. GCS score is 15.   Skin: Skin is warm and dry. She is not diaphoretic.    Psychiatric: Affect and judgment normal.   Vitals reviewed.    Ortho Exam    RADIOGRAPHS/X-RAYS:   Sever osteoarthritis right hip.     PLAN:   1. Primary osteoarthritis of both hips  For right total hip replacement.   - oxyCODONE-acetaminophen (PERCOCET) 5-325 mg per tablet; Take 1 Tab by mouth every six (6) hours as needed for Pain. Max Daily Amount: 4 Tabs. Indications: PAIN  Dispense: 70 Tab; Refill: 0      RECOMMENDATION:  Due to the current findings, affected activity of daily living and continued pain and discomfort, [name] [name] has documentation of failure to respond  adequately to (for a long time) of conservative therapy:which (Activity modification, home exercise, protective weight bearing, various analgesics and injections. surgical intervention is indicated. The alternatives, risks, and complications, including but not limited to infection, blood loss, need for blood transfusion, neurovascular damage, peri-incisional numbness, subcutaneous hematoma, bone fracture, anesthetic complications, DVT, PE, death, postoperative stiffness and pain, possible surgical scar, damage to blood vessels and nerves, failure of hardware , gait disturbance, prosthesis longevity,  failure to relieve symptoms, worsening of current symptoms, need for more surgery, MI, and stroke, have been discussed. The patient understands and wishes to proceed with surgery. right total hip replacement.     The patient is advised to remain NPO following midnight  prior to surgery. All of the patient's questions were answered to their satisfaction.   DISSCUSSION:  The patient was counseled on Personal impression, he instructions for follow-up, the importance of compliance with use and treatment options and risks factor reduction.    I have discussed the findings of this evaluation with the patient. The discussion included a completed verbal explanation any of changes into  examination results,diagnoses and current treatment plan. A scheduled for future care needs was explained. The patient verbalized understanding of these instructions. The patient acknowledged understanding the diagnoses and treatment. If any questions should arise after returning home I have encouraged the patient to feel free to call the office at [phone].    He patient understands it is their responsibility of the patient to complete all prescribed medications all recommend that testing, including but not limited to, laboratory studies and imaging.the patient further understands the need to keep all scheduled to follow-up visit and to inform the office immediately for any changes in the their medical condition.  The  Patient understands that the success of treatment in large part depends on patient willingness to complete the therapeutic regiment and to work in partnership with designated health care providers.   patient was reassured; the diagnosis was discussed and all questions we're answered to her satisfaction of the patient.  the patient has been notified to call the office with any questions, pains or problems.  The patient understands to call 911 in the case of a true medical emergency.    Madie RenoUosife M Paizleigh Wilds, MD   [facility]

## 2014-10-15 NOTE — Progress Notes (Signed)
Thank you so much for the note.  I hope you are able to relieve this poor lady's longstanding hip pain.    Sincerely,  [name] K Edwinna Rochette, MD

## 2014-10-15 NOTE — Progress Notes (Signed)
Follow up after injection under fluro of the right hip. She had relief for one week.

## 2014-10-15 NOTE — Patient Instructions (Signed)
Learning About Hip Replacement Surgery  What is hip replacement surgery?  During hip replacement surgery, your doctor replaces the worn parts of your hip joint with artificial parts made of metal, ceramic, or plastic.  You may want this surgery if you have hip pain and trouble moving that you can't treat in other ways.  Osteoarthritis or rheumatoid arthritis can cause these types of problems. And so can bone loss caused by a poor blood supply.  How is hip replacement surgery done?  In open hip replacement surgery, your doctor makes a 6- to 10-inch cut (incision) on the side of your hip. You will be asleep during the surgery. Your doctor will then:  ?? Remove the worn bone tissue and cartilage from the hip joint.  ?? Replace the ball at the upper end of your thighbone (femur).  ?? Replace your hip socket with a shell and liner.  ?? Fit the ball into the shell and liner to make a new hip joint.  Hip replacement can also be done with one or two smaller incisions. This is called minimally invasive surgery.  The incisions in both types of surgery leave scars that usually fade with time.  Surgery may take 1 to 3 hours.  There are two kinds of replacement joints.  ?? Cemented joints. The cement fits between the new joint and the bone.  ?? Uncemented joints. These have a metal coating with many small openings. The bone is shaped to fit the new joint almost perfectly. But there are some small spaces. Over time, the bone grows to fill these small openings.  Sometimes, the doctor uses a cemented ball and an uncemented socket.  Talk to your doctor about the best type of new hip joint for you.  What can you expect after hip replacement surgery?  You will stay in the hospital for a few days after surgery. Your rehabilitation program (rehab) will start at this time. When you go home, you will be able to move around with crutches or a walker. But you will need someone to help you at home for the next few weeks until your energy  returns and you can move around better. If there is no one to help you at home, you may go to a rehab center or an extended care center after you leave the hospital.  After your surgery, you may have much less pain than before. You may have a better quality of life.  Some doctors say you may feel better sooner if you have minimally invasive surgery. Experts do not yet know how the risks and benefits of this type of surgery compare to an open surgery.  ?? You will slowly return to most of your activities.  ?? You will probably stay in the hospital for 2 to 7 days after surgery.  ?? You may need physical therapy for 6 months or more.  ?? You may be able to walk on your own in 4 to 6 weeks. Before this, you will need crutches or a walker.  ?? Ask your doctor when you can drive again.  ?? You may be able to return to work in 4 weeks to 4 months. It depends on your job.  ?? Your doctor will tell you when you can swim, dance, golf, or bicycle. Ask your doctor about other activities you would like to do.  ?? Your doctor may tell you to avoid some activities. These may include running, tennis, horseback riding, or skiing.  ?? For  most people it is safe to have sex about 4 to 6 weeks after the surgery.  ?? You may have to take antibiotics before dental work or other medical procedures. This will help prevent infection around your new hip.  You will be in a rehab program. Rehab helps you get better faster.  ?? Your rehab may start before surgery. Rehab exercises can help get the hip ready for surgery. And they can make your recovery faster.  ?? After surgery, your physical therapist will get you started and teach you exercises. Then you will do them on your own.  ?? Rehab often lasts 6 months or more. It is not easy, and it takes time. Many people say that it is like "having a second job."  If you commit to your rehab and work hard, you will see a lot of improvement in a few months.   Most artificial hip joints last for 10 to 20 years or longer. It depends on your age, how much stress you put on the joint, and how well your new joint and bones mend. Your weight can make a difference. Every extra pound of body weight adds 3 pounds of stress to your new hip joint. More weight may cause it to wear out sooner.  Your doctor may want to see you about once a year to see how you and your new hip are doing.  Follow-up care is a key part of your treatment and safety. Be sure to make and go to all appointments, and call your doctor if you are having problems. It's also a good idea to know your test results and keep a list of the medicines you take.   Where can you learn more?   Go to [email or web address]huhttp://www.healthwise.net/[facility]  Enter [phone] in the search box to learn more about "Learning About Hip Replacement Surgery."   ?? 2006-2016 Healthwise, Incorporated. Care instructions adapted under license by [employer] (which disclaims liability or warranty for this information). This care instruction is for use with your licensed healthcare professional. If you have questions about a medical condition or this instruction, always ask your healthcare professional. Healthwise, Incorporated disclaims any warranty or liability for your use of this information.  Content Version: 10.8.513193; Current as of: Nov 01, 2013        Deciding About Total Hip Replacement  Your Care Instructions     Total hip replacement is surgery to replace the worn parts of your hip joint. You may be thinking about having this surgery if you have severe hip pain that limits your movement. Many people get arthritis that can cause the hip joints to wear down over time.  In this surgery, your doctor uses metal, ceramic, or plastic parts to replace the ball at the upper end of the thighbone (femur) and resurface the hip socket in the pelvic bone.  You can talk with your doctor to decide if this surgery is right for you.  You may want to consider your age, overall health, activity level, and how much pain you have.  Follow-up care is a key part of your treatment and safety. Be sure to make and go to all appointments, and call your doctor if you are having problems. It's also a good idea to know your test results and keep a list of the medicines you take.  Why would you have hip replacement?  ?? You have very bad hip pain. Medicine and other treatments do not ease the pain.  ?? You cannot  move well enough to do your daily activities.  ?? You want to keep golfing, biking, hiking, and doing other exercise.  ?? You are at a healthy weight or will be able to lose weight and keep it off. Being too heavy puts strain on the hip joint. This could make a new hip wear out more quickly than it would if you were at a healthy weight.  ?? You want to have the surgery before you lose too much of your ability to be active. If you are not able to stay active, strong, and flexible before the surgery, it can be harder to return to your normal activities after the surgery.  Why would you not have hip replacement?  ?? You are able to control your pain with medicine and other actions. These may include weight loss, changing your activities, and using a cane or crutches. You also could have shots of medicine to reduce pain in your hip.  ?? You are still able to move well enough to work, shop, walk, and do other things.  ?? You worry about doing physical therapy, or rehab, after the surgery. This takes 6 months. It also requires changes in how you sit, walk, and do other actions.  ?? You have a medical problem that could mean you would not do well with anesthesia and surgery.  ?? You may need another hip in 10 to 20 years. This is common with hip replacement.  When should you call for help?  Watch closely for changes in your health, and be sure to contact your doctor if:  ?? You want to learn more about hip replacement.  ?? You want to have a hip replacement.    Where can you learn more?   Go to [email or web address].huhttp://www.healthwise.net/[facility]  Enter P339 in the search box to learn more about "Deciding About Total Hip Replacement."   ?? 2006-2016 Healthwise, Incorporated. Care instructions adapted under license by [employer] [place] (which disclaims liability or warranty for this information). This care instruction is for use with your licensed healthcare professional. If you have questions about a medical condition or this instruction, always ask your healthcare professional. Healthwise, Incorporated disclaims any warranty or liability for your use of this information.  Content Version: 10.8.513193; Current as of: Nov 01, 2013

## 2014-10-23 ENCOUNTER — Inpatient Hospital Stay
Admit: 2014-10-23 | Discharge: 2014-10-23 | Disposition: A | Discharge status: Home / Self Care | Payer: MEDICARE | Attending: Emergency Medical Services

## 2014-10-23 DIAGNOSIS — M25551 Pain in right hip: Secondary | ICD-10-CM

## 2014-10-23 MED ORDER — KETOROLAC TROMETHAMINE 15 MG/ML INJECTION
15 mg/mL | INTRAMUSCULAR | Status: AC
Start: 2014-10-23 — End: 2014-10-23
  Administered 2014-10-23: 22:00:00 via INTRAMUSCULAR

## 2014-10-23 MED ORDER — OXYCODONE-ACETAMINOPHEN 5 MG-325 MG TAB
5-325 mg | ORAL | Status: AC
Start: 2014-10-23 — End: 2014-10-23
  Administered 2014-10-23: 22:00:00 via ORAL

## 2014-10-23 MED ORDER — OXYCODONE-ACETAMINOPHEN 5 MG-325 MG TAB
5-325 mg | ORAL_TABLET | Freq: Three times a day (TID) | ORAL | Status: DC | PRN
Start: 2014-10-23 — End: 2014-11-11

## 2014-10-23 MED FILL — OXYCODONE-ACETAMINOPHEN 5 MG-325 MG TAB: 5-325 mg | ORAL | Qty: 1

## 2014-10-23 MED FILL — KETOROLAC TROMETHAMINE 15 MG/ML INJECTION: 15 mg/mL | INTRAMUSCULAR | Qty: 2

## 2014-10-23 NOTE — Telephone Encounter (Signed)
Pt called and is requesting a refill of Percocet, [facility]. Please call pt when refill is ready to be picked up [phone].

## 2014-10-23 NOTE — ED Notes (Signed)
6:57 PM  10/23/2014     Discharge instructions given to patient (name) with verbalization of understanding. Patient accompanied by family.  Patient discharged with the following prescriptions (no prescriptions). Patient discharged to home (destination).      CESAR SILO, RN

## 2014-10-23 NOTE — Progress Notes (Signed)
Left message that Rx is available for pick [facility].

## 2014-10-24 NOTE — ED Provider Notes (Signed)
[facility]  EMERGENCY DEPARTMENT TREATMENT REPORT  NAME:  Lorensen, [name] [name]  SEX:   F  ADMIT: 10/23/2014  DOB:   Jul 12, 1950  MR#    [id number]  ROOM:  ER24  TIME DICTATED: 10 58 PM  ACCT#  [id number]    cc: Uosife Alfahd     CHIEF COMPLAINT:  Right hip pain.    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  She is scheduled for a hip replacement.  She takes regular   Percocet.  She is seen by Dr. Donato SchultzAlfahd.  She is due to follow up with him.  She   states she is out of her regular medications.  She denies any new injuries.    She denies any numbness, tingling, or weakness.    REVIEW OF SYSTEMS:  CONSTITUTIONAL:  No fever, chills, malaise.  MUSCULOSKELETAL:  Complaining of right hip pain.  SKIN:  No rashes.  NEUROLOGIC:  No numbness, tingling or weakness.  Denies complaints in all other systems.    PAST MEDICAL HISTORY:  Chronic pain, PTSD, GERD, hypertension, hysterectomy.    SOCIAL HISTORY:  Never smoked.    MEDICATIONS:  Reviewed, amlodipine, clonidine, gabapentin, hydralazine, potassium,   metoprolol, Naprosyn, Zofran, Percocet, and Seroquel.    ALLERGIES:  SHE IS NOT ALLERGIC TO ANY MEDICATIONS.    PHYSICAL EXAMINATION:  VITAL SIGNS:  Blood pressure is 129/137, temperature 98.6, pulse 87,   respirations 14, O2 sats are 98% on room air.  GENERAL:  The patient is a healthy appearing 64 year old female.    MUSCULOSKELETAL:  Right Hip:  She has some pain and decreased motion, in   internal and external rotation, hip flexion.  No bony tenderness, no   shortening or deformity.  Some PSIS spasm and posterior pain as well.  Some   tenderness of the greater trochanter.  She is able to ambulate.  SKIN:  Clean, dry and intact.  NEUROLOGIC:  She has 5/5 muscle strength in hip flexion, knee flexion,   dorsiflexion and plantar flexion of the foot without foot drop.    EMERGENCY DEPARTMENT COURSE:   The patient was seen and examined.  She was given an injection of Toradol here   and Percocet.  It looks as though Dr. Donato SchultzAlfahd filled the prescription today,   according to her medical record.  The patient was notified of this.  She was   not given any refills.    DIAGNOSIS:  Hip pain.    DISPOSITION:  Discharged to home in stable condition.    The patient was personally evaluated by myself and Dr. Buddy DutyZydron, who agrees with   the above assessment and plan.      ___________________  Lucio Edwardourtney T Courtland Reas MD  Dictated By: Crecencio McHarmony J. Madden, PA-C    My signature above authenticates this document and my orders, the final   diagnosis (es), discharge prescription (s), and instructions in the Epic   record.  If you have any questions please contact [phone].    Nursing notes have been reviewed by the physician/ advanced practice   clinician.    RKV  D:10/23/2014 22:58:34  T: 10/24/2014 10:19:04  [id number]

## 2014-11-11 MED ORDER — OXYCODONE-ACETAMINOPHEN 5 MG-325 MG TAB
5-325 mg | ORAL_TABLET | Freq: Three times a day (TID) | ORAL | Status: DC | PRN
Start: 2014-11-11 — End: 2014-12-02

## 2014-11-11 NOTE — Telephone Encounter (Signed)
Last Visit: 10/15/2014 with MD Alfahd    Next Appointment: 02/26/2015 Surgery   Previous Refill Encounters: 10/23/2014 per MD Alfahd #[id number]     Requested Prescriptions     Pending Prescriptions Disp Refills   ??? oxyCODONE-acetaminophen (PERCOCET) 5-325 mg per tablet 50 Tab 0     Sig: Take 1 Tab by mouth every eight (8) hours as needed for Pain. Max Daily Amount: 3 Tabs. Indications: PAIN

## 2014-11-11 NOTE — Telephone Encounter (Signed)
PT CALLING IN REQUESTING A REFILL ON oxyCODONE-acetaminophen (PERCOCET) 5-325 mg per tablet. PLEASE CALL PT WHEN AVAILABLE FOR PICKUP AT THE [facility] LOC[facility]. PT'S NUMBER IS [phone]

## 2014-11-11 NOTE — Telephone Encounter (Signed)
Patient is requesting that the Rx be changed to Percocet 10. Further investigation reveals that she has been receiving medication from other sources since establishing treatment with our office.

## 2014-11-13 NOTE — Progress Notes (Signed)
Checking charts for colonoscopy's that need to be satisfied in health maintenance.

## 2014-11-18 ENCOUNTER — Inpatient Hospital Stay
Admit: 2014-11-18 | Discharge: 2014-11-18 | Disposition: A | Discharge status: Home / Self Care | Payer: MEDICARE | Attending: Emergency Medicine

## 2014-11-18 ENCOUNTER — Emergency Department: Admit: 2014-11-18 | Payer: MEDICARE | Primary: Physician Assistant

## 2014-11-18 DIAGNOSIS — N210 Calculus in bladder: Secondary | ICD-10-CM

## 2014-11-18 LAB — CBC WITH AUTOMATED DIFF
BASOPHILS: 0.3 % (ref 0–3)
EOSINOPHILS: 0 % (ref 0–5)
HCT: 44.2 % (ref 37.0–50.0)
HGB: 15.8 gm/dl (ref 13.0–17.2)
IMMATURE GRANULOCYTES: 0.3 % (ref 0.0–3.0)
LYMPHOCYTES: 21.7 % — ABNORMAL LOW (ref 28–48)
MCH: 30.2 pg (ref 25.4–34.6)
MCHC: 35.7 gm/dl (ref 30.0–36.0)
MCV: 84.4 fL (ref 80.0–98.0)
MONOCYTES: 7.6 % (ref 1–13)
MPV: 10.2 fL — ABNORMAL HIGH (ref 6.0–10.0)
NEUTROPHILS: 70.1 % — ABNORMAL HIGH (ref 34–64)
NRBC: 0 (ref 0–0)
PLATELET: 239 10*3/uL (ref 140–450)
RBC: 5.24 M/uL — ABNORMAL HIGH (ref 3.60–5.20)
RDW-SD: 40.4 (ref 36.4–46.3)
WBC: 13.7 10*3/uL — ABNORMAL HIGH (ref 4.0–11.0)

## 2014-11-18 LAB — POC URINE MACROSCOPIC
Bilirubin: NEGATIVE
Glucose: NEGATIVE mg/dl
Ketone: 15 mg/dl — AB
Leukocyte Esterase: NEGATIVE
Nitrites: NEGATIVE
Protein: 100 mg/dl — AB
Specific gravity: 1.015 (ref 1.005–1.030)
Urobilinogen: 1 EU/dl (ref 0.0–1.0)
pH (UA): 5 (ref 5–9)

## 2014-11-18 LAB — METABOLIC PANEL, COMPREHENSIVE
ALT (SGPT): 14 U/L (ref 12–78)
AST (SGOT): 8 U/L — ABNORMAL LOW (ref 15–37)
Albumin: 3.6 gm/dl (ref 3.4–5.0)
Alk. phosphatase: 66 U/L (ref 45–117)
BUN: 18 mg/dl (ref 7–25)
Bilirubin, total: 1.1 mg/dl — ABNORMAL HIGH (ref 0.2–1.0)
CO2: 30 mEq/L (ref 21–32)
Calcium: 9.9 mg/dl (ref 8.5–10.1)
Chloride: 98 mEq/L (ref 98–107)
Creatinine: 0.8 mg/dl (ref 0.6–1.3)
GFR est AA: >60
GFR est non-AA: >60
Glucose: 124 mg/dl — ABNORMAL HIGH (ref 74–106)
Potassium: 2.8 mEq/L — CL (ref 3.5–5.1)
Protein, total: 9.4 gm/dl — ABNORMAL HIGH (ref 6.4–8.2)
Sodium: 136 mEq/L (ref 136–145)

## 2014-11-18 LAB — LIPASE: Lipase: 72 U/L — ABNORMAL LOW (ref 73–393)

## 2014-11-18 MED ORDER — POTASSIUM CHLORIDE 10 MEQ/100 ML IV PIGGY BACK
10 mEq/0 mL | Freq: Once | INTRAVENOUS | Status: AC
Start: 2014-11-18 — End: 2014-11-18
  Administered 2014-11-18: 20:00:00 via INTRAVENOUS

## 2014-11-18 MED ORDER — IOPAMIDOL 76 % IV SOLN
370 mg iodine /mL (76 %) | Freq: Once | INTRAVENOUS | Status: AC
Start: 2014-11-18 — End: 2014-11-18
  Administered 2014-11-18: 19:00:00 via INTRAVENOUS

## 2014-11-18 MED ORDER — POTASSIUM CHLORIDE 10 MEQ/100 ML IV PIGGY BACK
10 mEq/0 mL | Freq: Once | INTRAVENOUS | Status: AC
Start: 2014-11-18 — End: 2014-11-18
  Administered 2014-11-18: 18:00:00 via INTRAVENOUS

## 2014-11-18 MED ORDER — ONDANSETRON (PF) 4 MG/2 ML INJECTION
4 mg/2 mL | Freq: Once | INTRAMUSCULAR | Status: AC
Start: 2014-11-18 — End: 2014-11-18
  Administered 2014-11-18: 17:00:00 via INTRAVENOUS

## 2014-11-18 MED ORDER — POTASSIUM CHLORIDE SR 10 MEQ TAB
10 mEq | ORAL_TABLET | Freq: Two times a day (BID) | ORAL | Status: DC
Start: 2014-11-18 — End: 2014-12-02

## 2014-11-18 MED ORDER — IBUPROFEN 800 MG TAB
800 mg | ORAL_TABLET | Freq: Four times a day (QID) | ORAL | Status: AC | PRN
Start: 2014-11-18 — End: 2014-11-25

## 2014-11-18 MED ORDER — TAMSULOSIN SR 0.4 MG 24 HR CAP
0.4 mg | ORAL_CAPSULE | Freq: Every day | ORAL | Status: DC
Start: 2014-11-18 — End: 2014-12-02

## 2014-11-18 MED ORDER — MORPHINE 4 MG/ML SYRINGE
4 mg/mL | INTRAMUSCULAR | Status: AC
Start: 2014-11-18 — End: 2014-11-18
  Administered 2014-11-18: 18:00:00 via INTRAVENOUS

## 2014-11-18 MED ORDER — MORPHINE 2 MG/ML INJECTION
2 mg/mL | INTRAMUSCULAR | Status: AC
Start: 2014-11-18 — End: 2014-11-18
  Administered 2014-11-18: 21:00:00 via INTRAVENOUS

## 2014-11-18 MED ORDER — SODIUM CHLORIDE 0.9 % IJ SYRG
Freq: Once | INTRAMUSCULAR | Status: AC
Start: 2014-11-18 — End: 2014-11-18
  Administered 2014-11-18: 19:00:00 via INTRAVENOUS

## 2014-11-18 MED ORDER — SODIUM CHLORIDE 0.9% BOLUS IV
0.9 % | INTRAVENOUS | Status: AC
Start: 2014-11-18 — End: 2014-11-18
  Administered 2014-11-18: 17:00:00 via INTRAVENOUS

## 2014-11-18 MED ORDER — MORPHINE 4 MG/ML SYRINGE
4 mg/mL | INTRAMUSCULAR | Status: AC
Start: 2014-11-18 — End: 2014-11-18
  Administered 2014-11-18: 17:00:00 via INTRAVENOUS

## 2014-11-18 MED ORDER — ONDANSETRON HCL 4 MG TAB
4 mg | ORAL_TABLET | Freq: Three times a day (TID) | ORAL | Status: DC | PRN
Start: 2014-11-18 — End: 2014-12-02

## 2014-11-18 MED FILL — ONDANSETRON (PF) 4 MG/2 ML INJECTION: 4 mg/2 mL | INTRAMUSCULAR | Qty: 2

## 2014-11-18 MED FILL — POTASSIUM CHLORIDE 10 MEQ/100 ML IV PIGGY BACK: 10 mEq/0 mL | INTRAVENOUS | Qty: 100

## 2014-11-18 MED FILL — MORPHINE 2 MG/ML INJECTION: 2 mg/mL | INTRAMUSCULAR | Qty: 4

## 2014-11-18 MED FILL — MORPHINE 4 MG/ML SYRINGE: 4 mg/mL | INTRAMUSCULAR | Qty: 2

## 2014-11-18 MED FILL — MORPHINE 4 MG/ML SYRINGE: 4 mg/mL | INTRAMUSCULAR | Qty: 1

## 2014-11-18 MED FILL — ISOVUE-370  76 % INTRAVENOUS SOLUTION: 370 mg iodine /mL (76 %) | INTRAVENOUS | Qty: 80

## 2014-11-18 NOTE — ED Notes (Signed)
Transported to ultrasound

## 2014-11-18 NOTE — ED Notes (Signed)
Discharge pending while pt recieves K+ IV.  Given boxed lunch.  States her pain has decreased post inject.

## 2014-11-18 NOTE — ED Notes (Signed)
6:40 PM  11/18/2014     Discharge instructions given to Patien (name) with verbalization of understanding. Patient accompanied by husband.  Patient discharged with the following prescriptions yes. Patient discharged to home (destination).      Barnetta ChapelOBIN N DAMWEBER, RN

## 2014-11-18 NOTE — ED Notes (Signed)
Nausea and vomiting since last pm.  Also C/O right hip pain.

## 2014-11-19 NOTE — ED Provider Notes (Signed)
[facility]  EMERGENCY DEPARTMENT TREATMENT REPORT  NAME:  [name], [name]  SEX:   F  ADMIT: 11/18/2014  DOB:   [birth year]  MR#    [id number]  ROOM:  EA54  TIME DICTATED: 04 39 PM  ACCT#  [id number]        TIME OF EVALUATION:  11:18 a.m.    PRIMARY CARE PHYSICIAN:  Lollie Marrow, MD    CHIEF COMPLAINT:  Right hip pain, throwing up.    HISTORY OF PRESENT ILLNESS:  A 65 year old female relates that she has chronic hip pain, worse over the   last 2 to 3 days, without a history of trauma.  She also relates she has had   diffuse migratory abdominal pain associated with dry heaving and vomiting over   the last 3 days, as well as decreased bowel movements, which she believes to   be secondary to decreased p.o. intake.  Presents for further evaluation to the   Emergency Department.    REVIEW OF SYSTEMS:  CONSTITUTIONAL:  No reported fever.  She endorses chills, but the patient does   not feel that it is a febrile chill.    EYES:   No visual symptoms.  ENT:  No sore throat, runny nose, or other URI symptoms.  RESPIRATORY:  No cough, shortness of breath, or wheezing.  CARDIOVASCULAR:  No chest pain, chest pressure, or palpitations.  GASTROINTESTINAL:  Diffuse abdominal pain as above.    GENITOURINARY:  No dysuria, frequency, or urgency.   No hematuria, vaginal   bleeding or discharge.  MUSCULOSKELETAL:  No arthralgias or myalgias.  INTEGUMENTARY:  No rashes.  NEUROLOGIC:  Fatigue.  No unilateral weakness, slurred speech, facial droop or   altered mental status.    PAST MEDICAL HISTORY:  Chronic hip pain, due for surgery in August; hypertension, GERD, PTSD.    PAST SURGICAL HISTORY:  Hysterectomy.  No other abdominal surgeries.    SOCIAL HISTORY:  Nonsmoker, no alcohol or drug use.    FAMILY HISTORY:  Unknown.    ALLERGIES:  MULTIPLE AND REVIEWED IN EPIC.    MEDICATIONS:  Multiple documented prescriptions in Epic for narcotic medicines, specifically   Percocet.    PHYSICAL EXAMINATION:   VITAL SIGNS:  Blood pressure 133/105, pulse 89, respirations 18, temperature   98.5, pain 7 out of 10, O2 sats 98% on room air.  GENERAL APPEARANCE:  Patient appears well developed and well nourished.    Appearance and behavior are age and situation appropriate.  The patient   appears quite comfortable, except when they were trying to place an IV when   she had difficulty holding still because of the discomfort.  HEENT:  Eyes:  Conjunctivae clear, lids normal.  Pupils equal, symmetrical,   and normally reactive.  Ears/Nose:  Hearing is grossly intact to voice.    Internal and external examinations of the ears and nose are unremarkable.    Mouth/Throat:  Surfaces of the pharynx, palate, and tongue are pink, moist,   and without lesions.  Nasal mucosa, septum, and turbinates unremarkable.    Teeth and gums unremarkable.  RESPIRATORY:  Clear and equal breath sounds.  No respiratory distress,   tachypnea, or accessory muscle use.   CARDIOVASCULAR:   Heart regular, without murmurs, gallops, rubs, or thrills.  GASTROINTESTINAL:  Tender to palpation diffusely, particularly to left upper   and lower.  Minimal right lower tenderness.  No rebound, no guarding, no CVA   or flank tenderness of significance.  GENITOURINARY:  Deferred due to lack or GU symptoms with prominent GI symptoms   as above and history of hysterectomy in the remote past.  SPINE:  There is no localized cervical, thoracic, lumbar or sacral body   tenderness to palpation or fist percussion.  There are no bony step-offs,   ecchymosis, areas of soft tissue swelling or deformities.  MUSCULOSKELETAL:   Range of motion to right hip actively is minimal, passively   when I range of motion fully, the right hip with distraction, she seems to   have no discomfort.  When I bring it to her attention, she endorses severe   pain with range of motion of the right hip.  There is no limitation of pain.    I do not appreciate any significant bony tenderness, deformities or  bruising   to the site.  SKIN:  Warm and dry without rashes.  NEUROLOGIC:  Alert, oriented.  Sensation intact, motor strength equal and   symmetric.  There is no facial asymmetry or dysarthria.  Cranial nerves II-XII   intact.    INITIAL ASSESSMENT AND PLAN:    A well-appearing 64 year old female with no trauma concerning for fracture to   right hip and full range of motion.  I do not suspect septic arthritis,   especially afebrile.  She does have diffuse abdominal pain.  We will obtain   screening GI laboratory studies and CT to evaluate for acute abdominal   pathology, specifically concerning for diverticulitis as patient's symptoms   fail to improve.    DIAGNOSTIC STUDIES:  CBC with white count of 13.7, RBC of 5.24, MPV of 10.2, neutrophils 70.1%,   lymphocytes 21.7%.  Urine showed 100 protein, 15 ketones, small blood,   otherwise normal.  CMP showed potassium 2.8, glucose 124, bilirubin 1.1,   protein 9.4.  AST of 8, lipase of 72, otherwise normal.  CT abdomen and pelvis   showed a 7.3 mm stone in the urinary bladder with nonobstructing renal   calculi, diverticula without diverticulitis, otherwise normal per radiology.    EMERGENCY DEPARTMENT COURSE:     Patient stable in the Emergency Department.  Did not develop new symptoms.    Medicated with 3 rounds of IV morphine, IV Zofran times 1, which resolved her   nausea, and given 1 liter of fluid.  She was informed of the results.  She   perseveration on pain to the right hip.  I informed her this is a chronic   issue for her.  There is no sign of acute abnormality and that we had already   given 3 doses of IV narcotics, and I did not believed this required any more,   but certainly in terms of her kidney stone, we are comfortable giving her pain   medicine.  Her abdominal exam, on recheck, was benign.  She was given IV   potassium.  She requested boxed lunch.  She requested additional medicine to    go home with.  I did check the patient's PMP.  She received a prescription for   15 days' worth of Percocets on the 3rd of this month, and when I confronted   her with this, she states that taking Percocet for her was like taking   aspirin.  I informed her that we did not write for anything stronger than this   and did not feel it necessary, especially given the kidney stone was in the   urinary bladder.  I advised her to use her  remaining Percocet if needed.    Follow up with PCP, return with new or worsening symptoms.  She was given a   urine strainer and discharged.    FINAL DIAGNOSES:  1.  Renal colic.  2.  Chronic pain hip pain, right.  3.  Nonintractable vomiting with nausea and vomiting, unspecified type.    DISPOSITION AND TREATMENT PLAN:  Discharge instructions given with prescriptions for ibuprofen, Zofran, Flomax   and potassium chloride.  She was given in the Emergency Department for   correction of hypokalemia 20 mEq of potassium chloride.  She was already on   potassium medication.  I asked if she refill her prescription for same.    The patient is discharged home in stable condition, with instructions to   follow up with their regular doctor.  They are advised to return immediately   for any worsening or symptoms of concern.    The patient was personally evaluated by myself and Dr. Angelena Soleick Telvin Reinders, who   agrees with the above assessment and plan.     CONTINUATION BY Imogene BurnICHARD Lakesia Dahle, MD:    FINAL EMERGENCY DEPARTMENT COURSE:    I interviewed and examined the patient.  I discussed with the advanced   practice clinician and agree with their evaluation and plan as documented   here.  The patient had significant hypokalemia, received IV potassium and is   discharged home on same orally.  She is given medicine for nausea as well as   flank pain, may well have a small kidney stone as she has blood in her urine   but no infection.      ___________________  Imogene Burnichard Rydan Gulyas M.D.   Dictated By: Mearl LatinBryan P. Lockie ParesHendrick, PA-C    My signature above authenticates this document and my orders, the final   diagnosis (es), discharge prescription (s), and instructions in the Epic   record.  If you have any questions please contact [phone].    Nursing notes have been reviewed by the physician/ advanced practice   clinician.    MN  D:11/18/2014 16:39:50  T: 11/19/2014 06:31:16  [id number]

## 2014-11-21 ENCOUNTER — Inpatient Hospital Stay
Admit: 2014-11-21 | Discharge: 2014-11-21 | Disposition: A | Discharge status: Home / Self Care | Payer: MEDICARE | Attending: Emergency Medicine

## 2014-11-21 DIAGNOSIS — M25551 Pain in right hip: Secondary | ICD-10-CM

## 2014-11-21 MED ORDER — MORPHINE 4 MG/ML SYRINGE
4 mg/mL | INTRAMUSCULAR | Status: AC
Start: 2014-11-21 — End: 2014-11-21
  Administered 2014-11-21: 13:00:00 via INTRAVENOUS

## 2014-11-21 MED ORDER — SODIUM CHLORIDE 0.9 % IJ SYRG
Freq: Once | INTRAMUSCULAR | Status: AC
Start: 2014-11-21 — End: 2014-11-21
  Administered 2014-11-21: 13:00:00 via INTRAVENOUS

## 2014-11-21 MED ORDER — ONDANSETRON (PF) 4 MG/2 ML INJECTION
4 mg/2 mL | Freq: Once | INTRAMUSCULAR | Status: AC
Start: 2014-11-21 — End: 2014-11-21
  Administered 2014-11-21: 13:00:00 via INTRAVENOUS

## 2014-11-21 MED ORDER — MORPHINE 4 MG/ML SYRINGE
4 mg/mL | INTRAMUSCULAR | Status: DC
Start: 2014-11-21 — End: 2014-11-21

## 2014-11-21 MED ORDER — MORPHINE 4 MG/ML SYRINGE
4 mg/mL | INTRAMUSCULAR | Status: AC
Start: 2014-11-21 — End: 2014-11-21
  Administered 2014-11-21: 14:00:00 via INTRAMUSCULAR

## 2014-11-21 MED FILL — MORPHINE 4 MG/ML SYRINGE: 4 mg/mL | INTRAMUSCULAR | Qty: 1

## 2014-11-21 MED FILL — MORPHINE 4 MG/ML SYRINGE: 4 mg/mL | INTRAMUSCULAR | Qty: 2

## 2014-11-21 MED FILL — ONDANSETRON (PF) 4 MG/2 ML INJECTION: 4 mg/2 mL | INTRAMUSCULAR | Qty: 2

## 2014-11-21 NOTE — ED Provider Notes (Signed)
[facility]  EMERGENCY DEPARTMENT TREATMENT REPORT  NAME:  Mcdanel, Rini  SEX:   F  ADMIT: 11/21/2014  DOB:   09/13/1950  MR#    [id number]  ROOM:  OTF  TIME DICTATED: 09 53 AM  ACCT#  [id number]    cc: Lollie Marrow MD, Moreen Fowler Alfahd       PRIMARY CARE PHYSICIAN:   Dr. Jac Canavan     ORTHOPEDIST:  Dr. Donato Schultz    TIME OF EVALUATION:  5784    CHIEF COMPLAINT:  Hip pain.    HISTORY OF PRESENT ILLNESS:  This is a 64 year old female who presents complaining of a 10 out of 10,   sharp, constant right hip pain.  She has had surgery for a while.  She states   that she did have surgery in August, had a hip replacement by Dr. Donato Schultz in   [place].  She states that she has been treating this at home with Percocet,   but did not provide any relief.  The pain has been worse since the middle of   the night.  She denies any fevers, abdominal pain, nausea, vomiting or   diarrhea.  The patient was recently seen here 3 days ago.  She had hip pain   and abdominal pain at that time.  For that reason, they did labs and a CT   here.  Her white count was 13.7.  Her CT showed a 7.3 mm stone in the bladder   and diverticula but was otherwise unremarkable.    REVIEW OF SYSTEMS:  CONSTITUTIONAL:  No fevers, no chills.  GASTROINTESTINAL:  No abdominal pain, nausea, vomiting or diarrhea.  MUSCULOSKELETAL:  Positive right hip pain.  NEUROLOGIC:  Positive for chronic paresthesias to her bilateral feet.  INTEGUMENTARY:  No skin lesions, rashes.    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    FAMILY HISTORY:  Unknown.    SOCIAL HISTORY:  Nonsmoker.    ALLERGIES:  NONE.    MEDICATIONS:  Reviewed in Epic.    PHYSICAL EXAMINATION:  VITAL SIGNS:  Blood pressure 186/123, pulse 106, respiratory rate 18,   temperature 98.2, O2 saturation 98% on room air.  GENERAL APPEARANCE:  This is a well-developed, well-nourished 64 year old   female, nontoxic, in no acute distress.   HEENT:  Eyes:  Conjunctivae clear, lids normal.  Pupils equal, symmetrical,   and normally reactive.  Mouth/Throat:  Surfaces of the pharynx, palate, and   tongue are pink, moist, and without lesions.  RESPIRATORY:  Clear and equal breath sounds.  No respiratory distress,   tachypnea, or accessory muscle use.    CARDIOVASCULAR:   Heart regular, without murmurs, gallops, rubs, or thrills.    CHEST:  Symmetrical without masses or tenderness.    ABDOMEN:  Soft.  There is no reproducible abdominal pain on exam.  There is no   distention, no organomegaly, no masses, no tenderness in her pelvis, her   lower pelvic region with palpation.  MUSCULOSKELETAL:  The patient has tenderness along the lateral aspect of her   right hip.  She can internally and externally rotate but this causes pain.    She has some pain when I tried to flex her hip forward.  There is no overlying   skin wall lesions, masses, scars, ecchymosis or erythema noted.  The thigh is   unremarkable.  There was no tenderness distally along the right calf.  Dorsal   pedal pulses  are 2+ and symmetric bilaterally.  Sensation is intact to touch   to both feet.    INITIAL ASSESSMENT AND MANAGEMENT PLAN:  A 64 year old female who presents complaining of right hip pain.  She has a   history of chronic right hip pain.  She recently had a scan 3 days ago at our   facility.  I do not suspect that she has a septic joint.  She is afebrile.    She has pain secondary to degenerative joint disease.  I think that she needs   to have her hip replacement to ultimately solve her hip pain.  She is taking   Percocet at home and she has recently received a prescription for Percocet.  I   counseled her that she and her doctor need to have a better plan to control   her pain if the Percocet is not working.  We will give her some pain medicine   today.  I do not think she any further imaging or any other studies at this    time.  The patient was noted to be hypertensive today and she is on several   antihypertensives including amlodipine, clonidine, hydralazine, metoprolol.    She was advised to take her medications to keep her blood pressure under   control.    COURSE IN THE EMERGENCY DEPARTMENT:  The patient initially medicated with 6 mg of morphine IV.  She continued to   complain of pain.  I gave her 4 mg more of morphine over her IV fluids.    Therefore, she was given it IM.  She was requesting that we admit her to the   hospital to have her right hip replacement done today.  I counseled her that   we would not be able to do this on an emergent basis and that she would need   to follow up with her orthopedist to have this done.  She can take her   Percocet at home.  She is advised to return immediately to the ED if she has   any worsening symptoms.  She is stable at this time.  The patient was   personally evaluated by myself and Dr. Sherlon Handing, who agrees with the above   assessment and plan.      FINAL DIAGNOSIS:  Chronic Hip pain      ___________________  Wynelle Bourgeois MD  Dictated By: Guy Sandifer, PA-C    My signature above authenticates this document and my orders, the final   diagnosis (es), discharge prescription (s), and instructions in the Epic   record.  If you have any questions please contact [phone].    Nursing notes have been reviewed by the physician/ advanced practice   clinician.    MLT  D:11/21/2014 09:53:05  T: 11/21/2014 14:13:53  [id number]

## 2014-11-21 NOTE — ED Notes (Signed)
10:32 AM  11/21/2014     Discharge instructions given to patient (name) with verbalization of understanding. Patient accompanied by spouse.  Patient discharged with the following prescriptions None. Patient discharged to home (destination).      Ocie Doyne, RN

## 2014-11-21 NOTE — ED Notes (Signed)
Patient has not taken her BP meds this AM.

## 2014-11-21 NOTE — ED Notes (Signed)
Patient denies any injury, she states that her right hip needs a replacement but she is not able to have surgery until August.

## 2014-11-24 NOTE — Progress Notes (Signed)
Dear Dr Donato Schultz,    [name] [name] seems to go into the ER on a weekly to biweekly basis due to her hip pain. I see that you have planned surgery in a few months.  Is there anything that can be done in the interim to prevent the recurrent ER utilization?    Thank You,  [name] Deeds, MD

## 2014-11-27 NOTE — Telephone Encounter (Signed)
PT CALLING IN STATES THAT SHE IS IN A LOT OF PAIN, TRIED TO SCHEDULE THE PT FOR A FOLLOW UP, PT DECLINED, TRIED TO SEE IF SHE WANTED TO REQUEST A MEDICATION FOR THE PAIN OR A REFILL, PT DECLINED. PT STATES SHE WILL NOT DO ANYTHING UNTIL SHE TALKS WITH THE DOCTOR OR HIS NURSE, PT REQUESTING THAT THE DOCTOR OR NURSE CALL HER RIGHT AWAY.

## 2014-11-27 NOTE — Telephone Encounter (Signed)
[name] [name] calls for a refill of Percocet. She admits to not taking the last Rx as directed. She states that she is currently out of medication. According to records, her last Rx was filled on 11/14/14 and should have remained current until the 20th of June. She was seen in the ED on the 10th of June and received medications for pain as part of treatment there. She did not receive a handwritten Rx at that visit.

## 2014-11-28 MED ORDER — OXYCODONE-ACETAMINOPHEN 5 MG-325 MG TAB
5-325 mg | ORAL_TABLET | Freq: Four times a day (QID) | ORAL | Status: DC | PRN
Start: 2014-11-28 — End: 2014-11-28

## 2014-11-28 MED ORDER — OXYCODONE-ACETAMINOPHEN 5 MG-325 MG TAB
5-325 mg | ORAL_TABLET | ORAL | Status: DC | PRN
Start: 2014-11-28 — End: 2014-12-02

## 2014-11-28 NOTE — Telephone Encounter (Signed)
prescription of percocet 60 tabs was written for the patient to pick up

## 2014-11-28 NOTE — Telephone Encounter (Signed)
Rx available at [place].

## 2014-12-02 ENCOUNTER — Encounter: Attending: Family Medicine | Primary: Physician Assistant

## 2014-12-02 ENCOUNTER — Ambulatory Visit: Admit: 2014-12-02 | Discharge: 2014-12-02 | Payer: MEDICARE | Attending: Family Medicine | Primary: Physician Assistant

## 2014-12-02 DIAGNOSIS — I10 Essential (primary) hypertension: Secondary | ICD-10-CM

## 2014-12-02 MED ORDER — CHLORTHALIDONE 25 MG TAB
25 mg | ORAL_TABLET | Freq: Every day | ORAL | Status: DC
Start: 2014-12-02 — End: 2014-12-10

## 2014-12-02 MED ORDER — KETOROLAC TROMETHAMINE 30 MG/ML INJECTION
30 mg/mL (1 mL) | Freq: Once | INTRAMUSCULAR | Status: AC
Start: 2014-12-02 — End: 2014-12-02

## 2014-12-02 MED ORDER — CLONIDINE 0.1 MG TAB
0.1 mg | ORAL_TABLET | Freq: Once | ORAL | Status: AC
Start: 2014-12-02 — End: 2014-12-02

## 2014-12-02 MED ORDER — CHLORTHALIDONE 25 MG TAB
25 mg | ORAL_TABLET | Freq: Every day | ORAL | Status: DC
Start: 2014-12-02 — End: 2014-12-02

## 2014-12-02 MED ORDER — SEROQUEL XR 150 MG TABLET,EXTENDED RELEASE
150 mg | ORAL_TABLET | Freq: Every evening | ORAL | Status: DC
Start: 2014-12-02 — End: 2015-05-02

## 2014-12-02 MED ORDER — METOPROLOL TARTRATE 100 MG TAB
100 mg | ORAL_TABLET | Freq: Two times a day (BID) | ORAL | Status: DC
Start: 2014-12-02 — End: 2014-12-10

## 2014-12-02 NOTE — Patient Instructions (Addendum)
Please engage in the DASH diet.    Start Chlorthalidone 25mg  once daily.    I will reach out to Dr Donato Schultz today regarding your hip pain      Please get Shingles vaccination at [facility]  See you in 3 weeks.  DASH Diet: Care Instructions  Your Care Instructions  The DASH diet is an eating plan that can help lower your blood pressure. DASH stands for Dietary Approaches to Stop Hypertension. Hypertension is high blood pressure.  The DASH diet focuses on eating foods that are high in calcium, potassium, and magnesium. These nutrients can lower blood pressure. The foods that are highest in these nutrients are fruits, vegetables, low-fat dairy products, nuts, seeds, and legumes. But taking calcium, potassium, and magnesium supplements instead of eating foods that are high in those nutrients does not have the same effect. The DASH diet also includes whole grains, fish, and poultry.  The DASH diet is one of several lifestyle changes your doctor may recommend to lower your high blood pressure. Your doctor may also want you to decrease the amount of sodium in your diet. Lowering sodium while following the DASH diet can lower blood pressure even further than just the DASH diet alone.  Follow-up care is a key part of your treatment and safety. Be sure to make and go to all appointments, and call your doctor if you are having problems. It's also a good idea to know your test results and keep a list of the medicines you take.  How can you care for yourself at home?  Following the DASH diet  ?? Eat 4 to 5 servings of fruit each day. A serving is 1 medium-sized piece of fruit, ?? cup chopped or canned fruit, 1/4 cup dried fruit, or 4 ounces (?? cup) of fruit juice. Choose fruit more often than fruit juice.  ?? Eat 4 to 5 servings of vegetables each day. A serving is 1 cup of lettuce or raw leafy vegetables, ?? cup of chopped or cooked vegetables, or 4 ounces (?? cup) of vegetable juice. Choose vegetables more often than vegetable juice.   ?? Get 2 to 3 servings of low-fat and fat-free dairy each day. A serving is 8 ounces of milk, 1 cup of yogurt, or 1 ?? ounces of cheese.  ?? Eat 6 to 8 servings of grains each day. A serving is 1 slice of bread, 1 ounce of dry cereal, or ?? cup of cooked rice, pasta, or cooked cereal. Try to choose whole-grain products as much as possible.  ?? Limit lean meat, poultry, and fish to 2 servings each day. A serving is 3 ounces, about the size of a deck of cards.  ?? Eat 4 to 5 servings of nuts, seeds, and legumes (cooked dried beans, lentils, and split peas) each week. A serving is 1/3 cup of nuts, 2 tablespoons of seeds, or ?? cup of cooked beans or peas.  ?? Limit fats and oils to 2 to 3 servings each day. A serving is 1 teaspoon of vegetable oil or 2 tablespoons of salad dressing.  ?? Limit sweets and added sugars to 5 servings or less a week. A serving is 1 tablespoon jelly or jam, ?? cup sorbet, or 1 cup of lemonade.  ?? Eat less than 2,300 milligrams (mg) of sodium a day. If you have high blood pressure, diabetes, or chronic kidney disease, if you are African-American, or if you are older than age 9, try to limit the amount of sodium  you eat to less than 1,500 mg a day.  Tips for success  ?? Start small. Do not try to make dramatic changes to your diet all at once. You might feel that you are missing out on your favorite foods and then be more likely to not follow the plan. Make small changes, and stick with them. Once those changes become habit, add a few more changes.  ?? Try some of the following:  ?? Make it a goal to eat a fruit or vegetable at every meal and at snacks. This will make it easy to get the recommended amount of fruits and vegetables each day.  ?? Try yogurt topped with fruit and nuts for a snack or healthy dessert.  ?? Add lettuce, tomato, cucumber, and onion to sandwiches.  ?? Combine a ready-made pizza crust with low-fat mozzarella cheese and lots  of vegetable toppings. Try using tomatoes, squash, spinach, broccoli, carrots, cauliflower, and onions.  ?? Have a variety of cut-up vegetables with a low-fat dip as an appetizer instead of chips and dip.  ?? Sprinkle sunflower seeds or chopped almonds over salads. Or try adding chopped walnuts or almonds to cooked vegetables.  ?? Try some vegetarian meals using beans and peas. Add garbanzo or kidney beans to salads. Make burritos and tacos with mashed pinto beans or black beans.  Where can you learn more?  Go to [employer]Wrest[employer]at  Enter H967 in the search box to learn more about "DASH Diet: Care Instructions."  ?? 2006-2016 [employer]. Care instructions adapted under license by [employer] (which disclaims liability or warranty for this information). This care instruction is for use with your licensed healthcare professional. If you have questions about a medical condition or this instruction, always ask your healthcare professional. [place] any warranty or liability for your use of this information.  Content Version: 10.9.538570; Current as of: July 09, 2014

## 2014-12-02 NOTE — Progress Notes (Signed)
[name] [name] is a 64 y.o. African American female and presents for an subsequent annual wellness exam     Patient Active Problem List    Diagnosis Date Noted   ??? Foreign body in colon 08/14/2014   ??? Advanced care planning/counseling discussion 07/21/2014   ??? Essential hypertension 07/21/2014   ??? Hip pain, chronic 07/21/2014     Current Outpatient Prescriptions   Medication Sig Dispense Refill   ??? SEROQUEL XR 150 mg sr tablet Take 1 Tab by mouth nightly. 30 Tab 5   ??? ketorolac (TORADOL) 30 mg/mL (1 mL) injection 2 mL by IntraMUSCular route once for 1 dose. 2 Vial 0   ??? cloNIDine HCl (CATAPRES) 0.1 mg tablet Take 1 Tab by mouth once for 1 dose. Indications: HYPERTENSION 1 Tab 0   ??? metoprolol tartrate (LOPRESSOR) 100 mg IR tablet Take 1 Tab by mouth two (2) times a day. 60 Tab 2   ??? chlorthalidone (HYGROTEN) 25 mg tablet Take 1 Tab by mouth daily. 30 Tab 5   ??? amLODIPine (NORVASC) 10 mg tablet      ??? cloNIDine HCl (CATAPRES) 0.3 mg tablet      ??? hydrALAZINE (APRESOLINE) 50 mg tablet        Past Surgical History   Procedure Laterality Date   ??? Hx gyn  2010     hysterectomy     History   Substance Use Topics   ??? Smoking status: Never Smoker    ??? Smokeless tobacco: Never Used   ??? Alcohol Use: No         ROS   Negative except hip pain, headache and difficulty sleeping    All other systems reviewed and are negative.      History     Past Medical History   Diagnosis Date   ??? Chronic pain    ??? Hypertension    ??? GERD (gastroesophageal reflux disease)    ??? PTSD (post-traumatic stress disorder)      lived through [place]      Past Surgical History   Procedure Laterality Date   ??? Hx gyn  2010     hysterectomy     Current Outpatient Prescriptions   Medication Sig Dispense Refill   ??? SEROQUEL XR 150 mg sr tablet Take 1 Tab by mouth nightly. 30 Tab 5   ??? ketorolac (TORADOL) 30 mg/mL (1 mL) injection 2 mL by IntraMUSCular route once for 1 dose. 2 Vial 0    ??? cloNIDine HCl (CATAPRES) 0.1 mg tablet Take 1 Tab by mouth once for 1 dose. Indications: HYPERTENSION 1 Tab 0   ??? metoprolol tartrate (LOPRESSOR) 100 mg IR tablet Take 1 Tab by mouth two (2) times a day. 60 Tab 2   ??? chlorthalidone (HYGROTEN) 25 mg tablet Take 1 Tab by mouth daily. 30 Tab 5   ??? amLODIPine (NORVASC) 10 mg tablet      ??? cloNIDine HCl (CATAPRES) 0.3 mg tablet      ??? hydrALAZINE (APRESOLINE) 50 mg tablet        No Known Allergies  Family History   Problem Relation Age of Onset   ??? Hypertension Mother    ??? Cancer Maternal Aunt    ??? Alcohol abuse Maternal Grandfather      History   Substance Use Topics   ??? Smoking status: Never Smoker    ??? Smokeless tobacco: Never Used   ??? Alcohol Use: No  Patient Active Problem List   Diagnosis Code   ??? Advanced care planning/counseling discussion Z71.89   ??? Essential hypertension I10   ??? Hip pain, chronic M25.559, G89.29   ??? Foreign body in colon T18.4XXA       Health Maintenance History  Immunizations reviewed, dtap due , pneumovax due, flu not indicated, zoster due recommended to obtain from local pharmacy  Colonoscopy:Negative flex sigmoidoscopy in 08/2014   Chest CT :Not indicated,  Eye exam:Due- will place referral today  Mammo- Due- will place order today  Dexascan- Recent bilateral hip with no evidence of osteopenia/osteoporosis    Health Care Directive or Living Will: no and Patient given State of [place] application      Depression Risk Factor Screening:      Patient Health Questionnaire (PHQ-2)   Over the last 2 weeks, how often have you been bothered by any of the following problems?  ?? Little interest or pleasure in doing things?  ?? Not at all. [0]  ?? Feeling down, depressed, or hopeless?   ?? Not at all. [0]    Total Score: 0/6  PHQ-2 Assessment Scoring:   A score of 2 or more requires further screening with the PHQ-9    Alcohol Risk Factor Screening:     Women: On any occasion during the past 3 months, have you had more than 3  drinks containing alcohol?   Do you average more than 7 drinks per week?  Men: On any occasion during the past 3 months, have you had more than 4 drinks containing alcohol?  Do you average more than 14 drinks per week?    Functional Ability and Level of Safety:     Hearing Loss    normal-to-mild    Activities of Daily Living   Self-care.   Requires assistance with: ambulation and no ADLs    Fall Risk   Ambulatory aid device (15 pts)    Abuse Screen   None      Examination   Physical Examination  Filed Vitals:    12/02/14 1214   BP: 150/114   Pulse:    Temp:    TempSrc:    Resp:    Height:    Weight:    SpO2:    PainSc:    PainLoc:      Body mass index is 28.26 kg/(m^2).    Evaluation of Cognitive Function:  Mood/affect:Euthymic  Appearance:Well groomed, in discomfort due to hip pain  Family member/caregiver input:Self    alert, well appearing, and in no distress    Patient Care Team:  Littie Deeds, MD as PCP - General (Family Practice)  Madie Reno, MD (Orthopedic Surgery)    Advice/Referrals/Counseling/Plan:   Education and counseling provided:  DASH diet  Include in education list (weight loss, physical activity, smoking cessation, fall prevention, and nutrition)    ICD-10-CM ICD-9-CM    1. Essential hypertension with goal blood pressure less than 140/90 I10 401.9 cloNIDine HCl (CATAPRES) 0.1 mg tablet      metoprolol tartrate (LOPRESSOR) 100 mg IR tablet      chlorthalidone (HYGROTEN) 25 mg tablet      DISCONTINUED: chlorthalidone (HYGROTEN) 25 mg tablet      CANCELED: METABOLIC PANEL, COMPREHENSIVE      CANCELED: LIPID PANEL   2. PTSD (post-traumatic stress disorder) F43.10 309.81 SEROQUEL XR 150 mg sr tablet   3. Hip pain, chronic, left M25.552 719.45 ketorolac (TORADOL) 30 mg/mL (1 mL) injection    G89.29  338.29    4. Encounter for screening mammogram for malignant neoplasm of breast  Z12.31 V76.12 MAM MAMMO BI SCREENING DIGTL   5. Glaucoma screening Z13.5 V80.1 REFERRAL TO OPHTHALMOLOGY   .     I have discussed the diagnosis with the patient and the intended plan as seen in the above orders.  The patient has received an after-visit summary and questions were answered concerning future plans.  I have discussed medication side effects and warnings with the patient as well.I have reviewed the plan of care with the patient, accepted their input and they are in agreement with the treatment goals.         Follow-up Disposition:  Return in about 3 weeks (around 12/23/2014) for hypertension.    _____________________________________________________________    Problem Assessment    for treatment of   Chief Complaint   Patient presents with   ??? Annual Wellness Visit         SUBJECTIVE    Hypertension  Patient is here for follow-up of hypertension.  She indicates that she is having a headache  She is not exercising and is adherent to low salt diet.  Blood pressure not checking well controlled at home. Use of agents associated with hypertension: none.    Osteoarthritis and Chronic Pain:  Patient has osteoarthritis, primarily affecting the hips.   Symptoms onset: problem is longstanding.  Rheumatological ROS: increasing significant pain in R hips.   Response to treatment plan: gradually worsening. She reports percocet no matter the dose is ineffective for her.      Additional Concerns: tingling on bottom of feet       BP 150/114 mmHg   Pulse 72   Temp(Src) 97.2 ??F (36.2 ??C) (Oral)   Resp 18   Ht  (1.676 m)   Wt 175 lb (79.379 kg)   BMI 28.26 kg/m2   SpO2 96%  General appearance: alert, cooperative, no distress, appears stated age  Lungs: clear to auscultation bilaterally  Heart: regular rate and rhythm, S1, S2 normal, no murmur, click, rub or gallop  Neurologic: Grossly normal          Assessment/Plan:      Hypertension - poorly controlled- She has had several grossly elevated blood pressures from recent ER visits. Patient has been on metoprolol  tartrate  BID but has been using it only once daily. She had reported to [place] that her dose was . I called the pharmacy to clarify and obtain correct dosing. Will increase to  BID. Will also add Chlorthalidone  daily.    Right hip pain- Given  Toradol. I will send message to Dr Donato Schultz regarding pain control and she reports Percocet no matter the dose is not effective for her.

## 2014-12-02 NOTE — Progress Notes (Signed)
[name] [name] is a 64 y.o. female here today for her Annual Wellness Visit. Also c/o of bilateral foot numbness and right hip pain. Needs refill on Seroquel and Neurotin.    Learning assessment previously completed.    1. Have you been to the ER, urgent care clinic or hospitalized since your last visit? yes     2. Have you seen or consulted any other health care providers outside of the [facility] since your last visit (Include any pap smears or colon screening)? no     Do you have an Advanced Directive? Yes    Would you like information on Advanced Directives? no

## 2014-12-02 NOTE — Progress Notes (Signed)
Order faxed to [facility].

## 2014-12-05 ENCOUNTER — Ambulatory Visit
Admit: 2014-12-05 | Discharge: 2014-12-05 | Payer: MEDICARE | Attending: Orthopaedic Surgery | Primary: Physician Assistant

## 2014-12-05 ENCOUNTER — Inpatient Hospital Stay
Admit: 2014-12-05 | Discharge: 2014-12-05 | Disposition: A | Discharge status: Home / Self Care | Payer: MEDICARE | Attending: Emergency Medicine

## 2014-12-05 DIAGNOSIS — M25551 Pain in right hip: Secondary | ICD-10-CM

## 2014-12-05 DIAGNOSIS — M160 Bilateral primary osteoarthritis of hip: Secondary | ICD-10-CM

## 2014-12-05 MED ORDER — OXYCODONE-ACETAMINOPHEN 5 MG-325 MG TAB
5-325 mg | ORAL_TABLET | ORAL | Status: DC | PRN
Start: 2014-12-05 — End: 2014-12-18

## 2014-12-05 MED ORDER — DIAZEPAM 5 MG TAB
5 mg | ORAL | Status: AC
Start: 2014-12-05 — End: 2014-12-05
  Administered 2014-12-05: 06:00:00 via ORAL

## 2014-12-05 MED FILL — DIAZEPAM 5 MG TAB: 5 mg | ORAL | Qty: 1

## 2014-12-05 NOTE — Progress Notes (Signed)
Pt is being seen today for severe right hip pain. Pt went to the ER last night 12/04/14, Valium was given but nothing more was done for the pain.  Pt went to [facility].   Pt is able to ambulate with the use of a 4 point cane.  Pt is RHD.   Pt is currently retired.   Xrays were done at  [facility].  HM reviewed with pt and informed to follow up with any outdated HM that may need to be done.      Brief history has been reviewed with pt at time of OV.        Patient's blood pressure was elevated at today's office visit. Patient instructed to contact  primary care physician for treatment.

## 2014-12-05 NOTE — Progress Notes (Signed)
[name]  [birth year]   Chief Complaint   Patient presents with   ??? Hip Pain     right        HISTORY OF PRESENT ILLNESS:  [name] [name] is a 64 y.o. female who presents today for evaluation of bilateral hip pain. Pt is ambulating with a cane. She is scheduled for surgery on 01/26/15. Pt previously had an injection with moderate relief but her pain has returned. Pt went to the ER last night 12/04/14 for her hip pain and was given Valium. Her pain is constant and not improved by sitting or lying down. Pt has takes Percocet TID. Pt denies fevers, night sweats, or weight loss.        No Known Allergies     Past Medical History   Diagnosis Date   ??? Chronic pain    ??? Hypertension    ??? GERD (gastroesophageal reflux disease)    ??? PTSD (post-traumatic stress disorder)      lived through the [place]      History     Social History   ??? Marital Status: MARRIED     Spouse Name: N/A   ??? Number of Children: N/A   ??? Years of Education: N/A     Occupational History   ??? Not on file.     Social History Main Topics   ??? Smoking status: Never Smoker    ??? Smokeless tobacco: Never Used   ??? Alcohol Use: No   ??? Drug Use: No   ??? Sexual Activity: No     Other Topics Concern   ??? Not on file     Social History Narrative      Past Surgical History   Procedure Laterality Date   ??? Hx gyn  2010     hysterectomy      Family History   Problem Relation Age of Onset   ??? Hypertension Mother    ??? Cancer Maternal Aunt    ??? Alcohol abuse Maternal Grandfather       Current Outpatient Prescriptions   Medication Sig   ??? oxyCODONE-acetaminophen (PERCOCET) 5-325 mg per tablet Take 1 Tab by mouth every four (4) hours as needed for Pain. Max Daily Amount: 6 Tabs.   ??? metoprolol tartrate (LOPRESSOR) 100 mg IR tablet Take 1 Tab by mouth two (2) times a day.   ??? chlorthalidone (HYGROTEN) 25 mg tablet Take 1 Tab by mouth daily.   ??? amLODIPine (NORVASC) 10 mg tablet    ??? cloNIDine HCl (CATAPRES) 0.3 mg tablet     ??? hydrALAZINE (APRESOLINE) 50 mg tablet    ??? SEROQUEL XR 150 mg sr tablet Take 1 Tab by mouth nightly.     No current facility-administered medications for this visit.     REVIEW OF SYSTEM:  Review of Systems   Constitutional: Negative.    HENT: Negative for hearing loss.    Eyes: Negative for blurred vision and double vision.   Respiratory: Negative for cough, shortness of breath and wheezing.    Cardiovascular: Negative for chest pain, palpitations and PND.   Gastrointestinal: Negative for heartburn, nausea, vomiting and abdominal pain.   Genitourinary: Negative.    Musculoskeletal: Positive for joint pain. Negative for myalgias, back pain, falls and neck pain.   Neurological: Negative for dizziness, tingling, sensory change and headaches.       PHYSICAL EXAM:   BP 203/145 mmHg   Pulse 87   Temp(Src)  97.9 ??F (36.6 ??C) (Oral)   Resp 15   Ht  (1.702 m)   Wt 174 lb (78.926 kg)   BMI 27.25 kg/m2    Pain Assessment  12/05/2014   Location of Pain Hip   Location Modifiers Right   Severity of Pain 10   Quality of Pain Burning   Duration of Pain Persistent   Frequency of Pain Constant   Date Pain First Started -   Aggravating Factors Bending;Stretching;Straightening;Exercise;Kneeling;Squatting;Standing;Walking;Stairs   Limiting Behavior Yes   Relieving Factors Nothing   Relieving Factors Comment -   Result of Injury No       Psychiatric: Affect and mood are appropriate.   Physical Exam   Constitutional: She is oriented to person, place, and time and well-developed, well-nourished, and in no distress.   Musculoskeletal: She exhibits edema and tenderness.        Right hip: She exhibits decreased range of motion, decreased strength and tenderness. She exhibits no swelling, no crepitus, no deformity and no laceration.        Left hip: She exhibits decreased range of motion, decreased strength and tenderness. She exhibits no swelling, no crepitus, no deformity and no laceration.    Neurological: She is alert and oriented to person, place, and time.   Skin: Skin is warm and dry.   Psychiatric: Affect and judgment normal.   Vitals reviewed.    Ortho Exam      RADIOGRAPHS/X-RAYS:  XR Bil Hips (09/05/14):  IMPRESSION:  ??  Moderate bilateral hip osteoarthritis changes.    PLAN & RECOMMENDATION:    ICD-10-CM ICD-9-CM    1. Primary osteoarthritis of both hips M16.0 715.15 oxyCODONE-acetaminophen (PERCOCET) 5-325 mg per tablet       Extensive discussion was held with the patient regarding the diagnosis, various  treatment options, recovery period, and possible complications including the following  1. Pt is booked for surgery on 01/26/15.  2. Take Percocet as prescribed.  3. Follow up pre-op.     DISSCUSSION:  The patient was counseled on personal impression, his/her instructions for follow-up, the importance of compliance with use and treatment options and risks factor reduction.    Ms. Wierenga has a reminder for a "due or due soon" health maintenance. I have asked that she contact her primary care provider for follow-up on this health maintenance.    Patient's blood pressure was elevated at today's office visit. Patient instructed to contact primary care physician for treatment.     I have discussed the findings of this evaluation with the patient. The discussion included a completed verbal explanation any of changes into examination results,diagnoses and current treatment plan. A scheduled for future care needs was explained. The patient verbalized understanding of these instructions. The patient acknowledged understanding the diagnoses and treatment. If any questions should arise after returning home I have encouraged the patient to feel free to call the office at [phone].The patient understands to call 911 in the case of a true medical emergency.    The patient understands it is their responsibility of the patient to complete all prescribed medications all recommend that testing, including  but not limited to, laboratory studies and imaging. The patient further understands the need to keep all scheduled  follow-up visits and to inform the office immediately for any changes in the their medical condition.  The  Patient understands that the success of treatment in large part depends on patient willingness to complete the therapeutic regiment and to work in partnership  with designated health care providers.     Patient was reassured; the diagnosis was discussed and all questions were answered to the satisfaction of the patient.      Scribed by Yolanda Bonine (Scribekick) as dictated by Madie Reno, MD.  [facility]

## 2014-12-05 NOTE — ED Notes (Signed)
Pt has chronic hip pain. Worse today, but not associated with a fall or injury. Pt has scheduled hip replacement surgery in August.

## 2014-12-05 NOTE — ED Provider Notes (Signed)
[facility]  EMERGENCY DEPARTMENT TREATMENT REPORT  NAME:  Ellithorpe, Di  SEX:   F  ADMIT: 12/05/2014  DOB:   1950/11/17  MR#    [id number]  ROOM:  [id number]  TIME DICTATED: 02 20 AM  ACCT#  [id number]    cc: Lollie Marrow MD    CHIEF COMPLAINT:  Chronic right hip pain.    HISTORY OF PRESENT ILLNESS:  The patient is a 64 year old female who states she is scheduled for right hip   replacement by Dr. Donato Schultz in August 2016 who presents to the Emergency   Department complaining of her chronic right hip pain that started back again   today.  She states that this is the same pain that she has been having in the   past and that there is no recent fall or injury.  When asked what makes the   pain better or worse, she replies, \\"Pain medicine.\\"  She states that she has   been prescribed Percocet for this pain in the past and states that she has   been out for 1 to 2 weeks and the last time that she had the pain was about a   week ago.  She states that she has not contacted her orthopedist, Dr. Donato Schultz,   for refill and states that she just saw her PCP, Dr. Jac Canavan, yesterday and   did not get any prescriptions at that time.  She denies any associated   abdominal pain or weakness.  She reports some numbness to both of her feet,   which she has addressed with her PCP.  She states that the pain is to the   lateral aspect of her right hip/upper thigh.    REVIEW OF SYSTEMS:  GASTROINTESTINAL:  No abdominal pain, nausea or vomiting.  MUSCULOSKELETAL:  Positive right hip pain.  NEUROLOGIC:  No weakness.  SKIN:  No rash.    PAST MEDICAL HISTORY:  Includes chronic pain, hypertension, GERD and PTSD.    SOCIAL HISTORY:  No tobacco, alcohol or drug use.    ALLERGIES:  NO KNOWN ALLERGIES.    MEDICATIONS:  Include Norvasc, chlorthalidone, clonidine, hydralazine, Lopressor and   Seroquel.  The patient states that she takes Percocet for her hip pain.    PHYSICAL EXAMINATION:   VITAL SIGNS:  Temperature is 98.4, pulse 65, respirations 18, blood pressure   180/102, O2 sat is 100% on room air.  GENERAL APPEARANCE:  Patient appears well developed and well nourished.    Appearance and behavior are age and situation appropriate.    RESPIRATORY:  Clear and equal breath sounds.  No respiratory distress,   tachypnea, or accessory muscle use.    CARDIOVASCULAR:  Regular rate and rhythm.  GASTROINTESTINAL:  Abdomen is soft, nontender to palpation.  MUSCULOSKELETAL:  The patient with mild tenderness over the lateral right hip   and upper thigh area.    SKIN:  Warm and dry without rashes.  NEUROLOGIC:  The patient is alert, answering questions appropriately.    EMERGENCY DEPARTMENT ASSESSMENT:  The patient is a 64 year old female who reports an exacerbation of her chronic   right hip pain, which she states is the same as it has been in the past.  The   patient tells me that the last time that she has been out of her pain   medication for 1 to 2 weeks.  I pulled the prescription monitoring program   which revealed that she was prescribed 60 tabs 5/325 Percocet on  11/28/2014.    The patient was advised that she needs to follow up with her regular physician   for further pain medication refills.  She was given 5 mg Valium p.o. in the   Emergency Department.    DIAGNOSIS:  Chronic right hip pain.    DISPOSITION:  Discharge.  She is to call Dr. Overton Mam office tomorrow morning to arrange   followup appointment for treatment of her chronic hip pain.  No prescriptions   were given.  The patient was personally evaluated by myself and Dr. Raynald Kemp, who   agrees with the above assessment and plan.      ___________________  Dianna Rossetti M.D.  Dictated By: Teodoro Kil, PA-C    My signature above authenticates this document and my orders, the final   diagnosis (es), discharge prescription (s), and instructions in the Epic   record.  If you have any questions please contact [phone].     Nursing notes have been reviewed by the physician/ advanced practice   clinician.    MLT  D:12/05/2014 02:20:17  T: 12/05/2014 14:23:57  [id number]

## 2014-12-05 NOTE — ED Notes (Signed)
2:13 AM  12/05/2014     Discharge instructions given to [facility] (name) with verbalization of understanding. Patient accompanied by spouse.  Patient discharged with the following prescriptions n/a. Patient discharged to home (destination).      Franco Collet, RN

## 2014-12-05 NOTE — ED Provider Notes (Signed)
[facility]  EMERGENCY DEPARTMENT TREATMENT REPORT  NAME:  [name], [name]  SEX:   F  ADMIT: 11/21/2014  DOB:   Sep 17, 1950  MR#    [id number]  ROOM:  [id number]  TIME DICTATED: 04 01 AM  ACCT#  [id number]        CONTINUATION BY Candis Shine, MD:      A 64 year old female with chronic hip pain, here today for exacerbation of her   chronic hip pain.  She has had several workups in regard to that.  She does   follow with a primary orthopedist but has not undergone hip replacement which   she will go to eventually.  No indication for acute intervention today.  We   will have her follow up with primary orthopedist.  Plan otherwise per Diannia Ruder,   with whom I saw and examined the patient.      ___________________  Wynelle Bourgeois MD  Dictated By: Marland Kitchen     My signature above authenticates this document and my orders, the final   diagnosis (es), discharge prescription (s), and instructions in the Epic   record.  If you have any questions please contact [phone].    Nursing notes have been reviewed by the physician/ advanced practice   clinician.    MLT  D:12/05/2014 04:01:34  T: 12/05/2014 15:36:36  [id number]

## 2014-12-10 ENCOUNTER — Encounter

## 2014-12-10 MED ORDER — METOPROLOL TARTRATE 100 MG TAB
100 mg | ORAL_TABLET | Freq: Two times a day (BID) | ORAL | Status: DC
Start: 2014-12-10 — End: 2015-04-10

## 2014-12-10 MED ORDER — CHLORTHALIDONE 25 MG TAB
25 mg | ORAL_TABLET | Freq: Every day | ORAL | Status: DC
Start: 2014-12-10 — End: 2015-04-10

## 2014-12-10 MED ORDER — AMLODIPINE 10 MG TAB
10 mg | ORAL_TABLET | Freq: Every day | ORAL | Status: DC
Start: 2014-12-10 — End: 2015-04-10

## 2014-12-10 MED ORDER — METOPROLOL TARTRATE 100 MG TAB
100 mg | ORAL_TABLET | Freq: Two times a day (BID) | ORAL | Status: DC
Start: 2014-12-10 — End: 2014-12-10

## 2014-12-10 MED ORDER — AMLODIPINE 10 MG TAB
10 mg | ORAL_TABLET | Freq: Every day | ORAL | Status: DC
Start: 2014-12-10 — End: 2014-12-10

## 2014-12-10 MED ORDER — CHLORTHALIDONE 25 MG TAB
25 mg | ORAL_TABLET | Freq: Every day | ORAL | Status: DC
Start: 2014-12-10 — End: 2014-12-10

## 2014-12-10 NOTE — Progress Notes (Signed)
Refills of Chlorthalidone, metoprolol, and norvasc sent to [facility] on [address]    [name] [name], MD

## 2014-12-10 NOTE — Progress Notes (Signed)
Patient called crying this morning stating she needed a refill on her Seroquel and her Hygroten. Stated she hasn't slept in days. After reviewing her chart, I told patient that she had refills on both of her meds and told her the correct pharmacy. She was unaware and would go to the pharmacy this morning to get them.

## 2014-12-16 ENCOUNTER — Ambulatory Visit: Admit: 2014-12-16 | Discharge: 2014-12-16 | Payer: MEDICARE | Attending: Family Medicine | Primary: Physician Assistant

## 2014-12-16 ENCOUNTER — Inpatient Hospital Stay: Admit: 2014-12-16 | Payer: MEDICARE | Primary: Physician Assistant

## 2014-12-16 ENCOUNTER — Encounter: Attending: Family Medicine | Primary: Physician Assistant

## 2014-12-16 DIAGNOSIS — R5383 Other fatigue: Secondary | ICD-10-CM

## 2014-12-16 DIAGNOSIS — I10 Essential (primary) hypertension: Secondary | ICD-10-CM

## 2014-12-16 LAB — CBC WITH AUTOMATED DIFF
ABS. BASOPHILS: 0 10*3/uL (ref 0.0–0.06)
ABS. EOSINOPHILS: 0.1 10*3/uL (ref 0.0–0.4)
ABS. LYMPHOCYTES: 2.6 10*3/uL (ref 0.9–3.6)
ABS. MONOCYTES: 0.7 10*3/uL (ref 0.05–1.2)
ABS. NEUTROPHILS: 6.8 10*3/uL (ref 1.8–8.0)
BASOPHILS: 0 % (ref 0–2)
EOSINOPHILS: 1 % (ref 0–5)
HCT: 41.7 % (ref 35.0–45.0)
HGB: 14.4 g/dL (ref 12.0–16.0)
LYMPHOCYTES: 26 % (ref 21–52)
MCH: 30.4 PG (ref 24.0–34.0)
MCHC: 34.5 g/dL (ref 31.0–37.0)
MCV: 88 FL (ref 74.0–97.0)
MONOCYTES: 7 % (ref 3–10)
MPV: 12.2 FL — ABNORMAL HIGH (ref 9.2–11.8)
NEUTROPHILS: 66 % (ref 40–73)
PLATELET: 203 10*3/uL (ref 135–420)
RBC: 4.74 M/uL (ref 4.20–5.30)
RDW: 12.8 % (ref 11.6–14.5)
WBC: 10.3 10*3/uL (ref 4.6–13.2)

## 2014-12-16 LAB — AMB POC HEMOGLOBIN (HGB): Hemoglobin (POC): 11.8

## 2014-12-16 LAB — AMB POC HEMOGLOBIN A1C: Hemoglobin A1c (POC): 6 %

## 2014-12-16 LAB — TSH 3RD GENERATION: TSH: 0.24 u[IU]/mL — ABNORMAL LOW (ref 0.36–3.74)

## 2014-12-16 MED ORDER — KETOROLAC TROMETHAMINE 30 MG/ML INJECTION
30 mg/mL (1 mL) | Freq: Once | INTRAMUSCULAR | Status: AC
Start: 2014-12-16 — End: 2014-12-16

## 2014-12-16 NOTE — Patient Instructions (Signed)
Fatigue: Care Instructions  Your Care Instructions  Fatigue is a feeling of tiredness, exhaustion, or lack of energy. You may feel fatigue because of too much or not enough activity. It can also come from stress, lack of sleep, boredom, and poor diet. Many medical problems, such as viral infections, can cause fatigue. Emotional problems, especially depression, are often the cause of fatigue.  Fatigue is most often a symptom of another problem. Treatment for fatigue depends on the cause. For example, if you have fatigue because you have a certain health problem, treating this problem also treats your fatigue. If depression or anxiety is the cause, treatment may help.  Follow-up care is a key part of your treatment and safety. Be sure to make and go to all appointments, and call your doctor if you are having problems. It's also a good idea to know your test results and keep a list of the medicines you take.  How can you care for yourself at home?  ?? Get regular exercise. But don't overdo it. Go back and forth between rest and exercise.  ?? Get plenty of rest.  ?? Eat a healthy diet. Do not skip meals, especially breakfast.  ?? Reduce your use of caffeine, tobacco, and alcohol. Caffeine is most often found in coffee, tea, cola drinks, and chocolate.  ?? Limit medicines that can cause fatigue. This includes tranquilizers and cold and allergy medicines.  When should you call for help?  Watch closely for changes in your health, and be sure to contact your doctor if:  ?? You have new symptoms such as fever or a rash.  ?? Your fatigue gets worse.  ?? You have been feeling down, depressed, or hopeless. Or you may have lost interest in things that you usually enjoy.  ?? You are not getting better as expected.  Where can you learn more?  Go to http://www.healthwise.net/GoodHelpConnections  Enter W864 in the search box to learn more about "Fatigue: Care Instructions."   ?? 2006-2016 Healthwise, Incorporated. Care instructions adapted under license by [employer] (which disclaims liability or warranty for this information). This care instruction is for use with your licensed healthcare professional. If you have questions about a medical condition or this instruction, always ask your healthcare professional. Healthwise, Incorporated disclaims any warranty or liability for your use of this information.  Content Version: 10.9.538570; Current as of: May 02, 2014

## 2014-12-16 NOTE — Progress Notes (Signed)
[name] [name] is a 64 y.o. female here for HTN follow up. States she has been very fatigued with no energy for about 2 weeks. Would like a shot in R Hip for pain.

## 2014-12-17 LAB — VITAMIN B12: Vitamin B12: 1721 pg/mL — ABNORMAL HIGH (ref 211–911)

## 2014-12-17 NOTE — Progress Notes (Signed)
Quick Note:        Pt aware and will be in tomorrow for repeat labs    ______

## 2014-12-17 NOTE — Progress Notes (Signed)
Hypertension Follow Up Progress Note      Today's Date:  12/17/2014   Patient's Name: [name] [name]   Patient's DOB:  Oct 15, 1950     Subjective:     [name] [name] is a 64 y.o. female who presents for follow up of hypertension.    Diet and Lifestyle: generally follows a low sodium diet  Home BP Monitoring: is not measured at home    Cardiovascular ROS: taking medications as instructed, no medication side effects noted, no TIA's, no chest pain on exertion, no dyspnea on exertion, no swelling of ankles.     New concerns: fatigue for the last 2 weeks, continued R hip pain.     Review of Systems:   Constitutional: negative  Gastrointestinal: positive for small amounts of blood in stools when straining  Hematologic/lymphatic: negative for easy bruising  Endocrinology negative for temperature intolerance  Musculoskeletal:positive for Right hip pain- scheduled for surgery in August  Neurological: negative for dizziness, vertigo and seizures  Behavioral/Psych: negative for depression, having difficulty sleeping due to hip pain- she reports seroquel helps    BUN   Date Value Ref Range Status   11/18/2014 18 7 - 25 mg/dl Final     CREATININE   Date Value Ref Range Status   11/18/2014 0.8 0.6 - 1.3 mg/dl Final     GFR EST AA   Date Value Ref Range Status   11/18/2014 >60.0   Final     Comment:     THE [facility] STATES THAT THE MDRD STUDY EQUATION SHOULD ONLY BE USED ON  INDIVIDUALS 18 OR OLDER. THE REPORT ALSO NOTES THAT THE MDRD STUDY EQUATION HAS NOT BEEN  VALIDATED FOR USE WITH THE ELDERLY (OVER 34 YEARS OF AGE), PREGNANT WOMEN, PATIENTS WITH SERIOUS  COMORBID CONDITIONS, OR PERSONS WITH EXTREMES OF BODY SIZE, MUSCLE MASS, OR NUTRITIONAL STATUS.  APPLICATION OF THE EQUATION TO THESE PATIENT GROUPS MAY LEAD TO ERRORS IN GFR ESTIMATION. GFR  ESTIMATING EQUATIONS HAVE POORER AGREEMENT WITH MEASURED GFR FOR ILL HOSPITALIZED PATIENTS AND  FOR PEOPLE WITH NEAR NORMAL KIDNEY FUNCTION THAN FOR SUBJECTS IN THE MDRD  STUDY. VALIDATION  STUDIES ARE IN PROGRESS TO EVALUATE THE MDRD STUDY EQUATION FOR ADDITIONAL ETHNIC GROUPS, THE  ELDERLY, VARIOUS DISEASE CONDITIONS, AND PEOPLE WITH NORMAL KIDNEY FUNCTION.  GFRA----REFERS TO [employer]  GFRO---REFERS TO OTHER RACES  REFERENCES AVAILABLE UPON REQUEST.       HEMOGLOBIN A1C, EXTERNAL   Date Value Ref Range Status   01/08/2014 6.6  Final     BUN   Date Value Ref Range Status   11/18/2014 18 7 - 25 mg/dl Final     CREATININE   Date Value Ref Range Status   11/18/2014 0.8 0.6 - 1.3 mg/dl Final     GFR EST AA   Date Value Ref Range Status   11/18/2014 >60.0   Final     Comment:     THE [facility] STATES THAT THE MDRD STUDY EQUATION SHOULD ONLY BE USED ON  INDIVIDUALS 18 OR OLDER. THE REPORT ALSO NOTES THAT THE MDRD STUDY EQUATION HAS NOT BEEN  VALIDATED FOR USE WITH THE ELDERLY (OVER 16 YEARS OF AGE), PREGNANT WOMEN, PATIENTS WITH SERIOUS  COMORBID CONDITIONS, OR PERSONS WITH EXTREMES OF BODY SIZE, MUSCLE MASS, OR NUTRITIONAL STATUS.  APPLICATION OF THE EQUATION TO THESE PATIENT GROUPS MAY LEAD TO ERRORS IN GFR ESTIMATION. GFR  ESTIMATING EQUATIONS HAVE POORER AGREEMENT WITH MEASURED GFR FOR ILL  HOSPITALIZED PATIENTS AND  FOR PEOPLE WITH NEAR NORMAL KIDNEY FUNCTION THAN FOR SUBJECTS IN THE MDRD STUDY. VALIDATION  STUDIES ARE IN PROGRESS TO EVALUATE THE MDRD STUDY EQUATION FOR ADDITIONAL ETHNIC GROUPS, THE  ELDERLY, VARIOUS DISEASE CONDITIONS, AND PEOPLE WITH NORMAL KIDNEY FUNCTION.  GFRA----REFERS TO [employer]ICAN  GFRO---REFERS TO OTHER RACES  REFERENCES AVAILABLE UPON REQUEST.       HEMOGLOBIN A1C, EXTERNAL   Date Value Ref Range Status   01/08/2014 6.6  Final       Objective:     BP 106/72 mmHg   Pulse 82   Temp(Src) 99 ??F (37.2 ??C) (Oral)   Resp 18   Ht 5\' 7"  (1.702 m)   Wt 178 lb (80.74 kg)   BMI 27.87 kg/m2   SpO2 99%  Appearance: alert, well appearing, and in no distress and oriented to person, place, and time.   Neck: No thyroid enlargement or nodularity  Cardiovascular: Normal rate and regular rhythm, no gallop, no murmur., S1, S2 normal and no JVD   Pulmonary/Chest: Effort normal and breath sounds normal. No respiratory distress. No wheezes or rales  Lower Extremities (feet): warm, good capillary refill  R hip: No tenderness over greater trochanter  CVS exam BP noted to be well controlled today in office,    Assessment/Plan:   hypertension stable.  current treatment plan is effective, no change in therapy  lab results and schedule of future lab studies reviewed with patient.   hypertension well controlled.  current treatment plan is effective, no change in therapy.     ICD-10-CM ICD-9-CM    1. Essential hypertension with goal blood pressure less than 140/90 I10 401.9    2. Fatigue, unspecified type R53.83 780.79 AMB POC HEMOGLOBIN A1C      AMB POC HEMOGLOBIN (HGB)      VITAMIN B12      CBC WITH AUTOMATED DIFF      TSH 3RD GENERATION    Patient denies depression, Normal hemoglobin and HbA1c on POC testing, she reports she has hx of Vitamin B12 deficiency and was on Vitamin B12 injections in [place] previously.  Will recheck B12 and TSH to further evaluate for    3. Hip pain, chronic, right M25.551 719.45 ketorolac (TORADOL) 30 mg/mL (1 mL) injection, she has surgery scheduled for August with ortho    G89.29 338.29          Recommendations:  Limit sodium < 1500 mg/day  DASH diet  Wt reduction and maintenance  Exercise- 30 min 5 days/wk with intense 20 min 3 days/wk  Moderation of Alcohol intake: 1 serving/day, 5/wk -female, 2 servings/day  9/wk-female  Do not smoke  Avoid NSAIDs, pseudoephedrine, phenylephrine and herbal suppliments such as bitter orange, ginsing, st john's wort, licorice, caffeine pills.  Discussed with patient at length the need for blood pressure re-evaluation and management with primary care. Discussed the long term sequelae for elevated blood pressure including blindness, CVA, MI, and renal failure/  dialysis. Pt agrees to follow up as directed.     [name] K Haylei Cobin, MD

## 2014-12-17 NOTE — Addendum Note (Signed)
Addended by: Littie DeedsHARAKAN, Kandise Riehle K on: 12/17/2014 04:32 PM      Modules accepted: Orders

## 2014-12-18 ENCOUNTER — Inpatient Hospital Stay: Admit: 2014-12-18 | Payer: MEDICARE | Primary: Physician Assistant

## 2014-12-18 ENCOUNTER — Institutional Professional Consult (permissible substitution): Admit: 2014-12-18 | Discharge: 2014-12-18 | Payer: MEDICARE | Attending: Family Medicine | Primary: Physician Assistant

## 2014-12-18 ENCOUNTER — Encounter

## 2014-12-18 ENCOUNTER — Telehealth

## 2014-12-18 DIAGNOSIS — R946 Abnormal results of thyroid function studies: Secondary | ICD-10-CM

## 2014-12-18 DIAGNOSIS — R7989 Other specified abnormal findings of blood chemistry: Secondary | ICD-10-CM

## 2014-12-18 MED ORDER — OXYCODONE-ACETAMINOPHEN 5 MG-325 MG TAB
5-325 mg | ORAL_TABLET | ORAL | Status: DC | PRN
Start: 2014-12-18 — End: 2015-01-08

## 2014-12-18 MED ORDER — KETOROLAC TROMETHAMINE 30 MG/ML INJECTION
30 mg/mL (1 mL) | Freq: Once | INTRAMUSCULAR | Status: AC
Start: 2014-12-18 — End: 2014-12-18

## 2014-12-18 NOTE — Telephone Encounter (Signed)
Dr Jac Canavanharakan called to advise our office that he is refilling the patient's Percocet Rx as written previously by Dr Donato SchultzAlfahd in his absence. The patient is in his office at this time

## 2014-12-18 NOTE — Progress Notes (Signed)
[name] [name] is a 64 y.o. female here for an injection of Toradol. Patient tolerated well and left showing no s/s of distress.

## 2014-12-18 NOTE — ED Provider Notes (Signed)
[facility]  EMERGENCY [facility] TREATMENT REPORT  NAME:  Ager, Asalee  SEX:   F  ADMIT: 12/05/2014  DOB:   1951/05/06  MR#    [id number]  ROOM:  [id number]  TIME DICTATED: 07 26 AM  ACCT#  [id number]        ADDENDUM BY Dianna RossettiHELEN Geanna Divirgilio, MD:    The patient was seen with Teodoro Kilenee King, PA. We have gone over the history and   physical exam together.  I agree with Ms. Elmer BalesKing's management and plan. The   patient has chronic hip pain that was worse today.  She had no injury or fall.    She is scheduled for her hip replacement in August.  She was given some   Valium to help with muscle spasm.  She has followup with her PCP and   orthopedics for further pain management.  I agree with Ms. Elmer BalesKing's management   and plan.      ___________________  Dianna RossettiHelen Dymin Dingledine M.D.  Dictated By: Marland Kitchen.     My signature above authenticates this document and my orders, the final   diagnosis (es), discharge prescription (s), and instructions in the Epic   record.  If you have any questions please contact [phone].    Nursing notes have been reviewed by the physician/ advanced practice   clinician.    [facility]  D:12/18/2014 07:26:50  T: 12/18/2014 11:01:50  [id number]

## 2014-12-18 NOTE — Telephone Encounter (Signed)
Last Visit: 12/05/2014 with MD Alfahd    Date of Upcoming Surgery: 02/26/2015   Previous Refill Encounters: 12/05/2014 per MD Alfahd #60     Requested Prescriptions     Pending Prescriptions Disp Refills   ??? oxyCODONE-acetaminophen (PERCOCET) 5-325 mg per tablet 60 Tab 0     Sig: Take 1 Tab by mouth every four (4) hours as needed for Pain. Max Daily Amount: 6 Tabs.

## 2014-12-18 NOTE — Progress Notes (Signed)
Anne ShutterMonica R Marban is a 64 y.o. female here for labs only.

## 2014-12-18 NOTE — Telephone Encounter (Signed)
[name] [name] is in office today for repeat labwork due to abnormal TSH. She continues to have severe hip pain. Using percocet 4-6 times a day.  Was Rx'd 60 tabs by Dr Donato SchultzAlfahd on 12/05/14. She is now out. She had called Dr Overton MamAlfahd's office for refill but hoping they would be able to get fill here so [facility].  I called Dr Overton MamAlfahd's office to see if refill would be ok to be filled my me today.  Dr Donato SchultzAlfahd was unfortunately not available. I spoke with his nurse, Adele, we discussed her regular pain medication use and that I would be filling it today.  [name] [name] would also like an IM injection of Toradol today as she finds this effective and has previously been provided it by me.      [name] [name] was given IM injection of 60mg  today and I refilled her percocet at the same dosing as Dr Donato SchultzAlfahd had previously written for.    [name] K Aarian Cleaver, MD

## 2014-12-19 LAB — TSH AND FREE T4
T4, Free: 1 NG/DL (ref 0.7–1.5)
TSH: 0.54 u[IU]/mL (ref 0.36–3.74)

## 2014-12-22 NOTE — Progress Notes (Signed)
Quick Note:        Patient aware of results and expressed verbal understanding.    ______

## 2014-12-24 ENCOUNTER — Inpatient Hospital Stay: Admit: 2014-12-24 | Payer: MEDICARE | Primary: Physician Assistant

## 2014-12-24 ENCOUNTER — Institutional Professional Consult (permissible substitution): Admit: 2014-12-24 | Discharge: 2014-12-24 | Payer: MEDICARE | Attending: Family Medicine | Primary: Physician Assistant

## 2014-12-24 DIAGNOSIS — G8929 Other chronic pain: Secondary | ICD-10-CM

## 2014-12-24 DIAGNOSIS — B182 Chronic viral hepatitis C: Secondary | ICD-10-CM

## 2014-12-24 LAB — METABOLIC PANEL, COMPREHENSIVE
A-G Ratio: 0.9 (ref 0.8–1.7)
ALT (SGPT): 15 U/L (ref 13–56)
AST (SGOT): 9 U/L — ABNORMAL LOW (ref 15–37)
Albumin: 4 g/dL (ref 3.4–5.0)
Alk. phosphatase: 88 U/L (ref 45–117)
Anion gap: 7 mmol/L (ref 3.0–18)
BUN/Creatinine ratio: 29 — ABNORMAL HIGH (ref 12–20)
BUN: 23 MG/DL — ABNORMAL HIGH (ref 7.0–18)
Bilirubin, total: 0.7 MG/DL (ref 0.2–1.0)
CO2: 31 mmol/L (ref 21–32)
Calcium: 9.6 MG/DL (ref 8.5–10.1)
Chloride: 102 mmol/L (ref 100–108)
Creatinine: 0.8 MG/DL (ref 0.6–1.3)
GFR est AA: >60 mL/min/{1.73_m2} (ref >60)
GFR est non-AA: >60 mL/min/{1.73_m2} (ref >60)
Globulin: 4.7 g/dL — ABNORMAL HIGH (ref 2.0–4.0)
Glucose: 93 mg/dL (ref 74–99)
Potassium: 3.7 mmol/L (ref 3.5–5.5)
Protein, total: 8.7 g/dL — ABNORMAL HIGH (ref 6.4–8.2)
Sodium: 140 mmol/L (ref 136–145)

## 2014-12-24 MED ORDER — KETOROLAC TROMETHAMINE 30 MG/ML INJECTION
30 mg/mL (1 mL) | Freq: Once | INTRAMUSCULAR | Status: AC
Start: 2014-12-24 — End: 2014-12-24

## 2014-12-24 NOTE — Progress Notes (Signed)
[name] [name] is a 64 y.o. female here today for Toradol injection and lab work from Dr. Pincus SanesSullivan's office, her GI. Patient was given Toradol injection in her right gluteus muscle. Patient tolerated well and left showing no s/s of distress.

## 2014-12-25 MED ORDER — OXYCODONE-ACETAMINOPHEN 5 MG-325 MG TAB
5-325 mg | ORAL_TABLET | ORAL | Status: DC | PRN
Start: 2014-12-25 — End: 2015-01-08

## 2014-12-25 NOTE — Telephone Encounter (Signed)
Pt called requesting refill of Percocet, informed pt that the refill is too early, should have enough until the 17th. Also, it appears that there is a refill in place for the 17th from Dr. Overton MamAlfahd's office. Pt voiced understanding

## 2014-12-25 NOTE — Telephone Encounter (Signed)
PMP APPROPRIATE     Last Visit: 12/05/2014 with MD Alfahd    Date of Upcoming Surgery: 02/26/2015   Previous Refill Encounters: 12/18/2014 per MD Jac Canavanharakan #60     Patient's PMP provided a prescription in your absence. (see previous refill encounter)    Requested Prescriptions     Pending Prescriptions Disp Refills   ??? oxyCODONE-acetaminophen (PERCOCET) 5-325 mg per tablet 60 Tab 0     Sig: Take 1 Tab by mouth every four (4) hours as needed for Pain. Max Daily Amount: 6 Tabs. DNF 12/28/2014

## 2014-12-26 LAB — HCV RNA BY PCR W/REFL GENOTYPE
HEPATITIS C QUANTITATION, 550036: NOT DETECTED IU/mL
Hepatitis C Quantitation: NOT DETECTED IU/mL

## 2014-12-29 NOTE — Progress Notes (Signed)
Patient called stating she was out of Percocet and in a lot of pain. She said she had called Dr. Overton MamAlfahd's office Thursday and Friday and no one had called her back. She wanted to know if Dr. Jac Canavanharakan could give her another RX for Percocet. After reviewing her chart, I saw where Dr. Jac Canavanharakan had given her an RX for 60 pills on 12-18-2014.  I called Dr. Overton MamAlfahd's and spoke with someone and they said her RX for Percocet was up front and was ready to picked up. I called Mrs. Smoots back and told her [facility] at [facility] and ready.

## 2015-01-05 ENCOUNTER — Institutional Professional Consult (permissible substitution)
Admit: 2015-01-05 | Discharge: 2015-01-05 | Payer: MEDICARE | Attending: Physician Assistant | Primary: Physician Assistant

## 2015-01-05 DIAGNOSIS — G8929 Other chronic pain: Secondary | ICD-10-CM

## 2015-01-05 MED ORDER — KETOROLAC TROMETHAMINE 30 MG/ML INJECTION
30 mg/mL (1 mL) | Freq: Once | INTRAMUSCULAR | Status: AC
Start: 2015-01-05 — End: 2015-01-05

## 2015-01-05 MED ORDER — KETOROLAC TROMETHAMINE 60 MG/2 ML IM
60 mg/2 mL | Freq: Once | INTRAMUSCULAR | Status: DC
Start: 2015-01-05 — End: 2015-01-05

## 2015-01-05 NOTE — Progress Notes (Signed)
[name] [name] is a 64 y.o. female in today for Ketorolac injection. Admin 60 mg IM.    Patient tolerated well, in no additional distress.

## 2015-01-06 MED ORDER — OXYCODONE-ACETAMINOPHEN 5 MG-325 MG TAB
5-325 mg | ORAL_TABLET | ORAL | Status: DC | PRN
Start: 2015-01-06 — End: 2015-01-08

## 2015-01-06 NOTE — Telephone Encounter (Signed)
Last Visit: 12/05/2014 with MD Alfahd    Date of Upcoming Surgery: 02/26/2015   Previous Refill Encounters: 12/28/2014 per MD Alfahd #60     Requested Prescriptions     Pending Prescriptions Disp Refills   ??? oxyCODONE-acetaminophen (PERCOCET) 5-325 mg per tablet 60 Tab 0     Sig: Take 1 Tab by mouth every four (4) hours as needed for Pain. Max Daily Amount: 6 Tabs.

## 2015-01-06 NOTE — Telephone Encounter (Signed)
Patient is scheduled for surgery on 02/26/15. Prescription refill approved for Percocet 5/325 as requested and is ready for pick up at [facility].    Luna FuseNancy A Venisa Frampton, PA-C  01/06/2015

## 2015-01-07 NOTE — Telephone Encounter (Signed)
Called pt to let her know rx was ready at [facility] location

## 2015-01-08 MED ORDER — OXYCODONE-ACETAMINOPHEN 5 MG-325 MG TAB
5-325 mg | ORAL_TABLET | ORAL | Status: DC | PRN
Start: 2015-01-08 — End: 2015-01-08

## 2015-01-08 MED ORDER — OXYCODONE-ACETAMINOPHEN 5 MG-325 MG TAB
5-325 mg | ORAL_TABLET | ORAL | Status: DC | PRN
Start: 2015-01-08 — End: 2015-01-19

## 2015-01-13 NOTE — Addendum Note (Signed)
Addended by: PAGE, LISA L on: 01/13/2015 09:36 AM      Modules accepted: Orders

## 2015-01-16 ENCOUNTER — Inpatient Hospital Stay: Admit: 2015-01-16 | Payer: MEDICARE | Primary: Physician Assistant

## 2015-01-16 DIAGNOSIS — Z01818 Encounter for other preprocedural examination: Secondary | ICD-10-CM

## 2015-01-16 DIAGNOSIS — M1611 Unilateral primary osteoarthritis, right hip: Secondary | ICD-10-CM

## 2015-01-16 LAB — METABOLIC PANEL, COMPREHENSIVE
A-G Ratio: 0.7 — ABNORMAL LOW (ref 0.8–1.7)
ALT (SGPT): 16 U/L (ref 13–56)
AST (SGOT): 7 U/L — ABNORMAL LOW (ref 15–37)
Albumin: 3.5 g/dL (ref 3.4–5.0)
Alk. phosphatase: 81 U/L (ref 45–117)
Anion gap: 9 mmol/L (ref 3.0–18)
BUN/Creatinine ratio: 22 — ABNORMAL HIGH (ref 12–20)
BUN: 15 MG/DL (ref 7.0–18)
Bilirubin, total: 0.6 MG/DL (ref 0.2–1.0)
CO2: 30 mmol/L (ref 21–32)
Calcium: 9.6 MG/DL (ref 8.5–10.1)
Chloride: 102 mmol/L (ref 100–108)
Creatinine: 0.67 MG/DL (ref 0.6–1.3)
GFR est AA: >60 mL/min/{1.73_m2} (ref >60)
GFR est non-AA: >60 mL/min/{1.73_m2} (ref >60)
Globulin: 5.1 g/dL — ABNORMAL HIGH (ref 2.0–4.0)
Glucose: 130 mg/dL — ABNORMAL HIGH (ref 74–99)
Potassium: 3.2 mmol/L — ABNORMAL LOW (ref 3.5–5.5)
Protein, total: 8.6 g/dL — ABNORMAL HIGH (ref 6.4–8.2)
Sodium: 141 mmol/L (ref 136–145)

## 2015-01-16 LAB — CBC WITH AUTOMATED DIFF
ABS. BASOPHILS: 0 10*3/uL (ref 0.0–0.06)
ABS. EOSINOPHILS: 0.1 10*3/uL (ref 0.0–0.4)
ABS. LYMPHOCYTES: 2.6 10*3/uL (ref 0.9–3.6)
ABS. MONOCYTES: 0.9 10*3/uL (ref 0.05–1.2)
ABS. NEUTROPHILS: 7.1 10*3/uL (ref 1.8–8.0)
BASOPHILS: 0 % (ref 0–2)
EOSINOPHILS: 1 % (ref 0–5)
HCT: 41.6 % (ref 35.0–45.0)
HGB: 14.9 g/dL (ref 12.0–16.0)
LYMPHOCYTES: 25 % (ref 21–52)
MCH: 31.1 PG (ref 24.0–34.0)
MCHC: 35.8 g/dL (ref 31.0–37.0)
MCV: 86.8 FL (ref 74.0–97.0)
MONOCYTES: 8 % (ref 3–10)
MPV: 11.5 FL (ref 9.2–11.8)
NEUTROPHILS: 66 % (ref 40–73)
PLATELET: 207 10*3/uL (ref 135–420)
RBC: 4.79 M/uL (ref 4.20–5.30)
RDW: 12.2 % (ref 11.6–14.5)
WBC: 10.7 10*3/uL (ref 4.6–13.2)

## 2015-01-16 LAB — PTT: aPTT: 30.9 s (ref 23.0–36.4)

## 2015-01-16 LAB — URINALYSIS W/ RFLX MICROSCOPIC
Bilirubin: NEGATIVE
Blood: NEGATIVE
Glucose: 250 mg/dL — AB
Ketone: NEGATIVE mg/dL
Nitrites: NEGATIVE
Specific gravity: 1.02 (ref 1.005–1.030)
Urobilinogen: 1 EU/dL (ref 0.2–1.0)
pH (UA): 7 (ref 5.0–8.0)

## 2015-01-16 LAB — URINE MICROSCOPIC ONLY
RBC: 0 /hpf (ref 0–5)
WBC: 8 /hpf (ref 0–4)

## 2015-01-16 LAB — PROTHROMBIN TIME + INR
INR: 1 (ref 0.8–1.2)
Prothrombin time: 12.7 s (ref 11.5–15.2)

## 2015-01-16 NOTE — Addendum Note (Signed)
Addended by: Madie Reno on: 01/16/2015 11:19 AM      Modules accepted: Orders

## 2015-01-17 LAB — EKG, 12 LEAD, INITIAL
Atrial Rate: 66 {beats}/min
Calculated R Axis: 20 degrees
Calculated T Axis: -15 degrees
Diagnosis: NORMAL
P-R Interval: 192 ms
Q-T Interval: 432 ms
QRS Duration: 94 ms
QTC Calculation (Bezet): 452 ms
Ventricular Rate: 66 {beats}/min

## 2015-01-19 MED ORDER — OXYCODONE-ACETAMINOPHEN 5 MG-325 MG TAB
5-325 mg | ORAL_TABLET | ORAL | Status: DC | PRN
Start: 2015-01-19 — End: 2015-01-29

## 2015-01-19 NOTE — Telephone Encounter (Signed)
Last Visit: 12/05/2014 with MD Alfahd    Next Appointment: 01/26/2015 with MD Alfahd   Date of Upcoming Surgery: 01/29/2015   Previous Refill Encounters: 01/08/2015 per MD Sidney Ace #60     Requested Prescriptions     Pending Prescriptions Disp Refills   ??? oxyCODONE-acetaminophen (PERCOCET) 5-325 mg per tablet 60 Tab 0     Sig: Take 1 Tab by mouth every four (4) hours as needed for Pain. Max Daily Amount: 6 Tabs.

## 2015-01-20 ENCOUNTER — Ambulatory Visit
Admit: 2015-01-20 | Discharge: 2015-01-20 | Payer: MEDICARE | Attending: Physician Assistant | Primary: Physician Assistant

## 2015-01-20 ENCOUNTER — Encounter: Attending: Physician Assistant | Primary: Physician Assistant

## 2015-01-20 DIAGNOSIS — Z01818 Encounter for other preprocedural examination: Secondary | ICD-10-CM

## 2015-01-20 LAB — AMB POC HEMOGLOBIN A1C: Hemoglobin A1c (POC): 6.4 %

## 2015-01-20 MED ORDER — KETOROLAC TROMETHAMINE 60 MG/2 ML IM
60 mg/2 mL | Freq: Once | INTRAMUSCULAR | Status: DC
Start: 2015-01-20 — End: 2015-01-20

## 2015-01-20 NOTE — Patient Instructions (Signed)
Learning About High Blood Sugar  What is high blood sugar?  Your body turns the food you eat into glucose (sugar), which it uses for energy. But if your body isn't able to use the sugar right away, it can build up in your blood and lead to high blood sugar.  When the amount of sugar in your blood stays too high for too much of the time, you may have diabetes. Diabetes is a disease that can cause serious health problems.  The good news is that lifestyle changes may help you get your blood sugar back to normal and avoid or delay diabetes.  What causes high blood sugar?  Sugar (glucose) can build up in your blood if you:  ?? Are overweight.  ?? Have a family history of diabetes.  ?? Take certain medicines, such as steroids.  What are the symptoms?  Having high blood sugar may not cause any symptoms at all. Or it may make you feel very thirsty or very hungry. You may also urinate more often than usual, have blurry vision, or lose weight without trying.  How is high blood sugar treated?  You can take steps to lower your blood sugar level if you understand what makes it get higher. Your doctor may want you to learn how to test your blood sugar level at home. Then you can see how illness, stress, or different kinds of food or medicine raise or lower your blood sugar level.  Other tests may be needed to see if you have diabetes.  How can you prevent high blood sugar?  ?? Watch your weight. If you're overweight, losing just a small amount of weight may help. Reducing fat around your waist is most important.  ?? Eat a balanced diet. This may help you prevent or delay diabetes. Try to eat an even amount of carbohydrate throughout the day. This can help you avoid sudden peaks in blood sugar.  ?? Ask your doctor if you should see a dietitian. A registered dietitian can help you develop a meal plan that fits your lifestyle.  ?? Get at least 30 minutes of exercise on most days of the week. Exercise  helps control your blood sugar. It also helps you maintain a healthy weight. Walking is a good choice. You also may want to do other activities, such as running, swimming, cycling, or playing tennis or team sports.  ?? If your doctor prescribed medicines, take them exactly as prescribed. Call your doctor if you think you are having a problem with your medicine. You will get more details on the specific medicines your doctor prescribes.  Follow-up care is a key part of your treatment and safety. Be sure to make and go to all appointments, and call your doctor if you are having problems. It's also a good idea to know your test results and keep a list of the medicines you take.  Where can you learn more?  Go to http://www.healthwise.net/GoodHelpConnections  Enter O108 in the search box to learn more about "Learning About High Blood Sugar."  ?? 2006-2016 Healthwise, Incorporated. Care instructions adapted under license by [employer] (which disclaims liability or warranty for this information). This care instruction is for use with your licensed healthcare professional. If you have questions about a medical condition or this instruction, always ask your healthcare professional. Healthwise, Incorporated disclaims any warranty or liability for your use of this information.  Content Version: 10.9.538570; Current as of: Nov 01, 2013

## 2015-01-20 NOTE — Progress Notes (Signed)
HISTORY OF PRESENT ILLNESS  [name] [name] is a 64 y.o. female.  HPI  History and Physical  01/20/2015  Reason for today's visit: Pre-op clearance    HPI:  This is a 64 yo female with hx of HTN, depression/PTSD, hypokalemia and chronic right hip pain who is scheduled for Right hip replacement on 01/29/15 with Dr. Salome Arnt. She is here for Pre-op clearance. Her BP is controlled at today's visit and she admits to compliance with her medication. I reviewed her pre-op tests. Her UA shows that she is spilling glucose and 2+ bacteria. She denies a past hx of Diabetes. There is no FH of DM. She denies UTI symptoms, polyuria, polydipsia or unintentional weight loss. She does have chronic fatigue.   She takes potassium daily, unsure of the dose, but K+ is 3.2.   EKG was NSR.   She has had surgery with general anesthesia in the past without problems.   She still has a significant amount of right hip pain and is requesting toradol injection.    She denies CP, Palpitations, SOB, dizziness, lightheadedness, vision changes, N/V, C/D, tremors, memory problems, presyncope or syncope.        Patient related CV risk:   Minor:  uncontrolled HTN--> low/intermed risk procedure - proceed to surgery    Patient related pulmonary risks: Low risk     Preop Testing:    Resting EKG reviewed and shows HR 66 with NSR and mild LVH.     Labs:  Lab Results   Component Value Date/Time    WBC 10.7 01/16/2015 12:34 PM    HEMOGLOBIN (POC) 11.8 12/16/2014 02:13 PM    HGB 14.9 01/16/2015 12:34 PM    HCT 41.6 01/16/2015 12:34 PM    PLATELET 207 01/16/2015 12:34 PM    MCV 86.8 01/16/2015 12:34 PM       Lab Results   Component Value Date/Time    SODIUM 141 01/16/2015 12:34 PM    POTASSIUM 3.2 01/16/2015 12:34 PM    CHLORIDE 102 01/16/2015 12:34 PM    CO2 30 01/16/2015 12:34 PM    ANION GAP 9 01/16/2015 12:34 PM    GLUCOSE 130 01/16/2015 12:34 PM    BUN 15 01/16/2015 12:34 PM    CREATININE 0.67 01/16/2015 12:34 PM     BUN/CREATININE RATIO 22 01/16/2015 12:34 PM    GFR EST AA >60 01/16/2015 12:34 PM    GFR EST NON-AA >60 01/16/2015 12:34 PM    CALCIUM 9.6 01/16/2015 12:34 PM    BILIRUBIN, TOTAL 0.6 01/16/2015 12:34 PM    ALT 16 01/16/2015 12:34 PM    AST 7 01/16/2015 12:34 PM    ALK. PHOSPHATASE 81 01/16/2015 12:34 PM    PROTEIN, TOTAL 8.6 01/16/2015 12:34 PM    ALBUMIN 3.5 01/16/2015 12:34 PM    GLOBULIN 5.1 01/16/2015 12:34 PM    A-G RATIO 0.7 01/16/2015 12:34 PM   (Unknown if fasting)      Hepatitis C testing:  HCV not detected (12/24/14)        Past Medical History   Diagnosis Date   ??? Chronic pain    ??? Hypertension    ??? GERD (gastroesophageal reflux disease)    ??? PTSD (post-traumatic stress disorder)      lived through the [place] in 1993   ??? Arthritis    ??? Liver disease      HEP C       History     Social History   ???  Marital Status: MARRIED     Spouse Name: N/A   ??? Number of Children: N/A   ??? Years of Education: N/A     Occupational History   ??? Not on file.     Social History Main Topics   ??? Smoking status: Never Smoker    ??? Smokeless tobacco: Never Used   ??? Alcohol Use: No   ??? Drug Use: No   ??? Sexual Activity: No     Other Topics Concern   ??? Not on file     Social History Narrative       Family History   Problem Relation Age of Onset   ??? Hypertension Mother    ??? Cancer Maternal Aunt    ??? Alcohol abuse Maternal Grandfather        Current Outpatient Prescriptions on File Prior to Visit   Medication Sig Dispense Refill   ??? oxyCODONE-acetaminophen (PERCOCET) 5-325 mg per tablet Take 1 Tab by mouth every four (4) hours as needed for Pain. Max Daily Amount: 6 Tabs. 60 Tab 0   ??? amLODIPine (NORVASC) 10 mg tablet Take 1 Tab by mouth daily. 30 Tab 5   ??? chlorthalidone (HYGROTEN) 25 mg tablet Take 1 Tab by mouth daily. 30 Tab 5   ??? metoprolol tartrate (LOPRESSOR) 100 mg IR tablet Take 1 Tab by mouth two (2) times a day. 60 Tab 5   ??? SEROQUEL XR 150 mg sr tablet Take 1 Tab by mouth nightly. 30 Tab 5    ??? cloNIDine HCl (CATAPRES) 0.3 mg tablet      ??? hydrALAZINE (APRESOLINE) 50 mg tablet        No current facility-administered medications on file prior to visit.       No Known Allergies      ROS  ROS:  General: no wt loss/gain, fever, weakness, difficulty sleeping, + fatigue  Skin: no rashes, lumps, hair or nail changes  Head: no headache, injury, neck pain  Ears: no decreased hearing, ringing, earache, drainage  Eyes: no vision loss or changes, pain, blurriness  Nose: no congestion, discharge, nosebleeds, sinus pain  Throat: no soreness, dry mouth, non healing sores  Neck: no lumps, swollen glands, pain, stiffness  Respiratory: no cough, sputum, sob, wheezing, pain with deep breathing  CV: no chest pain, tightness, palpitations, dyspnea on exertion  GI:  No heartburn, difficulty swallowing, n/v, change in appetite, change in bowel habits, black, bloody, tarry stools, constipation, diarrhea  GU:  No frequency, urgency, burning or pain with urination, no hematuria, incontinence  Vascular: no calf pain with walking, cramping  Musculoskeletal:  + muscle or joint pain  Neuro: no dizziness, fainting, seizures, weakness, numbness/tingling, tremor  Hematologic: no easy bruising/bleeding  Endocrine: no heat/cold intolerance, sweating, frequent urination, thirst, change in appetite  Psych: no nervousness, stress, depression, memory loss      BP 130/90 mmHg   Pulse 74   Temp(Src) 98.1 ??F (36.7 ??C) (Oral)   Resp 18   Ht 5' 8"  (1.727 m)   Wt 178 lb (80.74 kg)   BMI 27.07 kg/m2   SpO2 95%    Physical Exam     General: well developed, well nourished female, sitting upright on exam table in no acute distress  HEENT: PEERLA, conjuctive pink, sclerae anicteric, oropharynx- top and bottom dentures  Neck: supple, no lymphadenopathy, thyromegaly or JVD  Respirations: clear to auscultation bilaterally, no wheezes, rhonchi or rales,   Cardiovascular: regular rate and rhythm, no murmurs, gallops or  rubs   Abdomen:  soft, non tender, no hepatosplenomegaly  Extremities: no edema, normal radial pulses bilaterally  Neuro: alert and oriented x3, motor grossly intact  Psych: normal affect and behavior, thought content normal  Derm: no rashes or lesions  Musculoskeletal: gait slow, walking with cane      ASSESSMENT and PLAN    ICD-10-CM ICD-9-CM    1. Pre-op examination Z01.818 V72.84    2. Hip pain, chronic, right M25.551 719.45 KETOROLAC TROMETHAMINE INJ    G89.29 338.29 PR THER/PROPH/DIAG INJECTION, SUBCUT/IM      DISCONTINUED: ketorolac tromethamine (TORADOL) 60 mg/2 mL soln   3. Essential hypertension with goal blood pressure less than 140/90 I10 401.9    4. Glucosuria R81 791.5 AMB POC HEMOGLOBIN A1C   5. Hyperglycemia  R73.9 790.29 AMB POC HEMOGLOBIN A1C   6. Hypokalemia E87.6 276.8      Plan:  AVIYA JARVIE is a low/intermediate risk for surgical procedure.  This is scheduled for 01/29/15, Right Hip Replacement with Dr. Salome Arnt.   Following evaluation recommended prior to surgery: POC A1C 01/20/15 was 6.4, and serum glucose 130. I have not initiated diabetes medication at today's visit. She will follow up with Dr. Gaetano Net after her surgery.   She will call our office today to report the dose of her current Potassium replacement. The dosage will need to be increased.   Continue other current medications unless instructed by surgeon.   Patient agrees with plan and verbalizes understanding.     Follow-up Disposition:  Return in about 1 month (around 02/20/2015) for elevated glucose.      Wallene Huh, PA-C  01/20/2015

## 2015-01-20 NOTE — Progress Notes (Signed)
[name] [name] is a 64 y.o. female here for pre-op for R hip replacement, would like a pain shot today if possible

## 2015-01-26 ENCOUNTER — Ambulatory Visit
Admit: 2015-01-26 | Discharge: 2015-01-26 | Payer: MEDICARE | Attending: Orthopaedic Surgery | Primary: Physician Assistant

## 2015-01-26 ENCOUNTER — Encounter: Admit: 2015-01-26 | Primary: Physician Assistant

## 2015-01-26 ENCOUNTER — Encounter: Admit: 2015-01-26 | Discharge: 2015-01-26 | Payer: MEDICARE | Primary: Physician Assistant

## 2015-01-26 DIAGNOSIS — M1611 Unilateral primary osteoarthritis, right hip: Secondary | ICD-10-CM

## 2015-01-26 DIAGNOSIS — M160 Bilateral primary osteoarthritis of hip: Secondary | ICD-10-CM

## 2015-01-26 MED ORDER — OXYCODONE-ACETAMINOPHEN 5 MG-325 MG TAB
5-325 mg | ORAL_TABLET | ORAL | 0 refills | Status: DC | PRN
Start: 2015-01-26 — End: 2015-01-29

## 2015-01-26 NOTE — Telephone Encounter (Signed)
The call was to inquire if the referal had been updated.  Misty StanleyLisa at [facility]: [phone]

## 2015-01-26 NOTE — Telephone Encounter (Signed)
Spoke with Army Chacoosine Kemeni, FNP, she states pt cannot get a shot for pain today because her surgery is in a few days and it is an NSAID, may cause them to postpone the surgery. She can try taking OTC Tylenol arthritis. Pt voiced understanding.

## 2015-01-26 NOTE — Patient Instructions (Signed)
Hip Replacement: Before Your Surgery  What is hip replacement surgery?  Hip replacement surgery uses metal, ceramic, or plastic parts to replace the ball at the upper end of the thighbone (femur). This surgery also smooths the hip socket in the pelvic bone. Your doctor will make a 6- to 10-inch cut on the side of your hip to do this. This cut is called an incision. The surgery can also be done with one or two smaller incisions. The incisions in both types of surgery leave scars that will fade with time.  You will likely stay in the hospital for 2 to 7 days after your surgery. Your rehabilitation program (rehab) starts when you are still in the hospital. You will do rehab for 6 months or more. It takes at least 3 months to return to full activity. This will depend on your condition and rehab program. Most people can go back to work in 4 weeks to 4 months. This depends on your condition and the type of job you have.  After surgery and rehab, you likely will have much less pain than before the surgery. You should be able to return to your normal routine. Your doctor may suggest that you avoid heavy activities, such as playing tennis or jogging. He or she may tell you not to do things where a fall may happen. For example, don't ride a horse or go skiing. You may have to take antibiotics before you have dental work or a medical procedure. This helps reduce the chance that your new hip will become infected. Always tell your caregivers that you have an artificial hip.  Follow-up care is a key part of your treatment and safety. Be sure to make and go to all appointments, and call your doctor if you are having problems. It's also a good idea to know your test results and keep a list of the medicines you take.  What happens before surgery?  Surgery can be stressful. This information will help you understand what you can expect. And it will help you safely prepare for surgery.  Preparing for surgery   ?? Understand exactly what surgery is planned, along with the risks, benefits, and other options.  ?? Tell your doctors ALL the medicines, vitamins, supplements, and herbal remedies you take. Some of these can increase the risk of bleeding or interact with anesthesia.  ?? If you take blood thinners, such as warfarin (Coumadin), clopidogrel (Plavix), or aspirin, be sure to talk to your doctor. He or she will tell you if you should stop taking these medicines before your surgery. Make sure that you understand exactly what your doctor wants you to do.  ?? Your doctor will tell you which medicines to take or stop before your surgery. You may need to stop taking certain medicines a week or more before surgery. So talk to your doctor as soon as you can.  ?? If you have an advance directive, let your doctor know. It may include a living will and a durable power of attorney for health care. Bring a copy to the hospital. If you don't have one, you may want to prepare one. It lets your doctor and loved ones know your health care wishes. Doctors advise that everyone prepare these papers before any type of surgery or procedure.  What happens on the day of surgery?  ?? Follow the instructions exactly about when to stop eating and drinking. If you don't, your surgery may be canceled. If your doctor told you   to take your medicines on the day of surgery, take them with only a sip of water.  ?? Take a bath or shower before you come in for your surgery. Do not apply lotions, perfumes, deodorants, or nail polish.  ?? Do not shave the surgical site yourself.  ?? Take off all jewelry and piercings. And take out contact lenses, if you wear them.  At the hospital or surgery center  ?? Bring a picture ID.  ?? The area for surgery is often marked to make sure there are no errors.  ?? You will get antibiotics through the IV tube before surgery. This lowers the risk of an infection.   ?? You will be kept comfortable and safe by your anesthesia provider. The anesthesia may make you sleep. Or it may just numb the area being worked on.  ?? The surgery usually takes 1 to 3 hours.  Going home  ?? You may be taken to a short-term rehabilitation center after you leave the hospital. If not, you need to be careful when you leave the hospital. Be sure your car has high seats. You might want to ride in the backseat, so you can stretch out your leg on the car seat.  ?? Be sure you have someone to drive you home. Anesthesia and pain medicine make it unsafe for you to drive.  ?? You will be given more specific instructions about recovering from your surgery. They will cover things like diet, wound care, follow-up care, driving, and getting back to your normal routine.  When should you call your doctor?  ?? You have questions or concerns.  ?? You don't understand how to prepare for your surgery.  ?? You become ill before the surgery (such as fever, flu, or a cold).  ?? You need to reschedule or have changed your mind about having the surgery.  Where can you learn more?  Go to http://www.healthwise.net/GoodHelpConnections  Enter Q357 in the search box to learn more about "Hip Replacement: Before Your Surgery."  ?? 2006-2016 Health[employer], Inc[employer]. Care instructions adapted under license by [employer] (which disclaims liability or warranty for this information). This care instruction is for use with your licensed healthcare professional. If you have questions about a medical condition or this instruction, always ask your healthcare professional. Healthwise, Incorporated disclaims any warranty or liability for your use of this information.  Content Version: 10.9.538570; Current as of: Nov 01, 2013

## 2015-01-26 NOTE — Progress Notes (Signed)
Xray only.

## 2015-01-26 NOTE — Progress Notes (Signed)
[name] ShutterMonica R Strider  12/04/1950   Chief Complaint   Patient presents with   ??? Hip Pain     right hip   ??? Surg H&P     Right THR        HISTORY OF PRESENT ILLNESS:  [name] [name] is a 64 y.o. female who presents today for follow up of preop of total hip replacement. Pt reports no changes in her general health or symptoms since her last visit. Pt ambulates with quadcane. Pt reports running out of Percocet. Pt states on average she takes Percocet 5-325 mg 4 tablets per day. She still complain of pain in her groin, laterally, buttock area and radiate down to her knee. Pt denies fevers, night sweats, or weight loss.       No Known Allergies     Past Medical History   Diagnosis Date   ??? Arthritis    ??? Chronic pain    ??? GERD (gastroesophageal reflux disease)    ??? Hypertension    ??? Liver disease      HEP C   ??? PTSD (post-traumatic stress disorder)      [place]      Social History     Social History   ??? Marital status: MARRIED     Spouse name: N/A   ??? Number of children: N/A   ??? Years of education: N/A     Occupational History   ??? Not on file.     Social History Main Topics   ??? Smoking status: Never Smoker   ??? Smokeless tobacco: Never Used   ??? Alcohol use No   ??? Drug use: No   ??? Sexual activity: No     Other Topics Concern   ??? Not on file     Social History Narrative      Past Surgical History   Procedure Laterality Date   ??? Hx gyn  2010     hysterectomy   ??? Hx endoscopy        Family History   Problem Relation Age of Onset   ??? Hypertension Mother    ??? Cancer Maternal Aunt    ??? Alcohol abuse Maternal Grandfather       Current Outpatient Prescriptions   Medication Sig   ??? oxyCODONE-acetaminophen (PERCOCET) 5-325 mg per tablet Take 1 Tab by mouth every four (4) hours as needed for Pain. Max Daily Amount: 6 Tabs. Indications: PAIN   ??? oxyCODONE-acetaminophen (PERCOCET) 5-325 mg per tablet Take 1 Tab by mouth every four (4) hours as needed for Pain. Max Daily Amount: 6 Tabs.    ??? amLODIPine (NORVASC) 10 mg tablet Take 1 Tab by mouth daily.   ??? chlorthalidone (HYGROTEN) 25 mg tablet Take 1 Tab by mouth daily.   ??? metoprolol tartrate (LOPRESSOR) 100 mg IR tablet Take 1 Tab by mouth two (2) times a day.   ??? SEROQUEL XR 150 mg sr tablet Take 1 Tab by mouth nightly.   ??? cloNIDine HCl (CATAPRES) 0.3 mg tablet    ??? hydrALAZINE (APRESOLINE) 50 mg tablet      No current facility-administered medications for this visit.      Outpatient Prescriptions Marked as Taking for the 01/26/15 encounter (Office Visit) with Madie RenoUosife M Elbert Polyakov, MD   Medication Sig Dispense Refill   ??? oxyCODONE-acetaminophen (PERCOCET) 5-325 mg per tablet Take 1 Tab by mouth every four (4) hours as needed for Pain. Max Daily Amount:  6 Tabs. Indications: PAIN 50 Tab 0   ??? oxyCODONE-acetaminophen (PERCOCET) 5-325 mg per tablet Take 1 Tab by mouth every four (4) hours as needed for Pain. Max Daily Amount: 6 Tabs. 60 Tab 0   ??? amLODIPine (NORVASC) 10 mg tablet Take 1 Tab by mouth daily. 30 Tab 5   ??? chlorthalidone (HYGROTEN) 25 mg tablet Take 1 Tab by mouth daily. 30 Tab 5   ??? metoprolol tartrate (LOPRESSOR) 100 mg IR tablet Take 1 Tab by mouth two (2) times a day. 60 Tab 5   ??? SEROQUEL XR 150 mg sr tablet Take 1 Tab by mouth nightly. 30 Tab 5   ??? cloNIDine HCl (CATAPRES) 0.3 mg tablet      ??? hydrALAZINE (APRESOLINE) 50 mg tablet            REVIEW OF SYSTEM:  Review of Systems   Constitutional: Negative.    HENT: Negative for hearing loss.    Eyes: Negative for blurred vision and double vision.   Respiratory: Negative for cough, shortness of breath and wheezing.    Cardiovascular: Negative for chest pain, palpitations and PND.   Gastrointestinal: Negative for abdominal pain, heartburn, nausea and vomiting.   Genitourinary: Negative.    Musculoskeletal: Positive for joint pain. Negative for back pain, falls, myalgias and neck pain.   Neurological: Negative for dizziness, tingling, sensory change and headaches.       PHYSICAL EXAM:    Visit Vitals   ??? BP (!) 165/123 (BP 1 Location: Right arm, BP Patient Position: Sitting)   ??? Pulse 73   ??? Temp 98.7 ??F (37.1 ??C) (Oral)   ??? Resp 16   ??? Ht  (1.727 m)   ??? Wt 177 lb (80.3 kg)   ??? BMI 26.91 kg/m2       Pain Assessment  01/26/2015   Location of Pain Hip   Location Modifiers Right   Severity of Pain 10   Quality of Pain Aching   Duration of Pain Persistent   Frequency of Pain Constant   Date Pain First Started -   Aggravating Factors Walking;Standing;Squatting;Kneeling;Exercise;Straightening;Bending;Stretching   Limiting Behavior Yes   Relieving Factors NSAID   Relieving Factors Comment -   Result of Injury No       Psychiatric: Affect and mood are appropriate.   Physical Exam Leg length is equal.   Constitutional: She is oriented to person, place, and time and well-developed, well-nourished, and in no distress.   Musculoskeletal: She exhibits edema and tenderness.   Right hip: She exhibits decreased range of motion, decreased strength and tenderness. She exhibits no swelling, no crepitus, no deformity and no laceration.   Left hip: She exhibits decreased range of motion, decreased strength and tenderness. She exhibits no swelling, no crepitus, no deformity and no laceration.   Neurological: She is alert and oriented to person, place, and time.   Skin: Skin is warm and dry.   Psychiatric: Affect and judgment normal.   Vitals reviewed.    Lab Results   Component Value Date/Time    WBC 10.7 01/16/2015 12:34 PM    HEMOGLOBIN (POC) 11.8 12/16/2014 02:13 PM    HGB 14.9 01/16/2015 12:34 PM    HCT 41.6 01/16/2015 12:34 PM    PLATELET 207 01/16/2015 12:34 PM    MCV 86.8 01/16/2015 12:34 PM     Lab Results   Component Value Date/Time    SODIUM 141 01/16/2015 12:34 PM    POTASSIUM 3.2 01/16/2015 12:34 PM  CHLORIDE 102 01/16/2015 12:34 PM    CO2 30 01/16/2015 12:34 PM         Ortho Exam  X-rays shows:  Sever right hip osteoarthritis changes with osteophytes   Left hip shows moderate OA        PLAN & RECOMMENDATION:    ICD-10-CM ICD-9-CM    1. Primary osteoarthritis of both hips M16.0 715.15 oxyCODONE-acetaminophen (PERCOCET) 5-325 mg per tablet   2. Primary osteoarthritis of right hip M16.11 715.15        Extensive discussion was held with the patient regarding the diagnosis, various  treatment options, recovery period, and possible complications including the following  1. Pt is scheduled for surgery on 01/26/15  2. Discussed with patient she should continue with her exercises after surgery.  3. I will refill her Percocet.  4. Follow up postop.    Due to the current findings, affected activity of daily living and continued pain and discomfort, MONICIA TSE has documentation of failure to respond?? adequately to (for a long time) of conservative therapy:which (Activity modification, home exercise, night bracing, various analgesics and injections. surgical intervention is indicated. The alternatives, risks, and complications, including but not limited to infection,?? neurovascular damage specially median nerve , peri-incisional numbness, subcutaneous hematoma,?? anesthetic complications, postoperative stiffness and pain, possible surgical scar, damage to blood vessels and nerves,failure to relieve symptoms, worsening of current symptoms, need for more surgery,?? hip dislocation leg length discrepancy. Patient Express a full understanding of the proposed procedure, alternative treatments, and complications, patient decide the benefits of the proposed surgery outweigh the potential problems associated with the intervention, the patient wishes to proceed with right total hip replacement surgery.        DISSCUSSION:  The patient was counseled on personal impression, his/her instructions for follow-up, the importance of compliance with use and treatment options and risks factor reduction.    Ms. Sherrard has a reminder for a "due or due soon" health maintenance. I  have asked that she contact her primary care provider for follow-up on this health maintenance.    I have discussed the findings of this evaluation with the patient. The discussion included a completed verbal explanation any of changes into examination results,diagnoses and current treatment plan. A scheduled for future care needs was explained. The patient verbalized understanding of these instructions. The patient acknowledged understanding the diagnoses and treatment. If any questions should arise after returning home I have encouraged the patient to feel free to call the office at [phone].The patient understands to call 911 in the case of a true medical emergency.    The patient understands it is their responsibility of the patient to complete all prescribed medications all recommend that testing, including but not limited to, laboratory studies and imaging. The patient further understands the need to keep all scheduled  follow-up visits and to inform the office immediately for any changes in the their medical condition.  The  Patient understands that the success of treatment in large part depends on patient willingness to complete the therapeutic regiment and to work in partnership with designated health care providers.     Patient was reassured; the diagnosis was discussed and all questions were answered to the satisfaction of the patient.      Scribed by Yolanda Bonine (Scribekick) as dictated by Madie Reno, MD.  [facility]

## 2015-01-26 NOTE — H&P (View-Only) (Signed)
[name] [name]  02/03/1951   Chief Complaint   Patient presents with   ??? Hip Pain     right hip   ??? Surg H&P     Right THR        HISTORY OF PRESENT ILLNESS:  [name] [name] is a 64 y.o. female who presents today for follow up of preop of total hip replacement. Pt reports no changes in her general health or symptoms since her last visit. Pt ambulates with quadcane. Pt reports running out of Percocet. Pt states on average she takes Percocet 5-325 mg 4 tablets per day. She still complain of pain in her groin, laterally, buttock area and radiate down to her knee. Pt denies fevers, night sweats, or weight loss.       No Known Allergies     Past Medical History   Diagnosis Date   ??? Arthritis    ??? Chronic pain    ??? GERD (gastroesophageal reflux disease)    ??? Hypertension    ??? Liver disease      HEP C   ??? PTSD (post-traumatic stress disorder)      lived through the [place] in [place]      Social History     Social History   ??? Marital status: MARRIED     Spouse name: N/A   ??? Number of children: N/A   ??? Years of education: N/A     Occupational History   ??? Not on file.     Social History Main Topics   ??? Smoking status: Never Smoker   ??? Smokeless tobacco: Never Used   ??? Alcohol use No   ??? Drug use: No   ??? Sexual activity: No     Other Topics Concern   ??? Not on file     Social History Narrative      Past Surgical History   Procedure Laterality Date   ??? Hx gyn  2010     hysterectomy   ??? Hx endoscopy        Family History   Problem Relation Age of Onset   ??? Hypertension Mother    ??? Cancer Maternal Aunt    ??? Alcohol abuse Maternal Grandfather       Current Outpatient Prescriptions   Medication Sig   ??? oxyCODONE-acetaminophen (PERCOCET) 5-325 mg per tablet Take 1 Tab by mouth every four (4) hours as needed for Pain. Max Daily Amount: 6 Tabs. Indications: PAIN   ??? oxyCODONE-acetaminophen (PERCOCET) 5-325 mg per tablet Take 1 Tab by mouth every four (4) hours as needed for Pain. Max Daily Amount: 6 Tabs.    ??? amLODIPine (NORVASC) 10 mg tablet Take 1 Tab by mouth daily.   ??? chlorthalidone (HYGROTEN) 25 mg tablet Take 1 Tab by mouth daily.   ??? metoprolol tartrate (LOPRESSOR) 100 mg IR tablet Take 1 Tab by mouth two (2) times a day.   ??? SEROQUEL XR 150 mg sr tablet Take 1 Tab by mouth nightly.   ??? cloNIDine HCl (CATAPRES) 0.3 mg tablet    ??? hydrALAZINE (APRESOLINE) 50 mg tablet      No current facility-administered medications for this visit.      Outpatient Prescriptions Marked as Taking for the 01/26/15 encounter (Office Visit) with Teaghan Melrose M Jaren Vanetten, MD   Medication Sig Dispense Refill   ??? oxyCODONE-acetaminophen (PERCOCET) 5-325 mg per tablet Take 1 Tab by mouth every four (4) hours as needed for Pain. Max Daily Amount:   6 Tabs. Indications: PAIN 50 Tab 0   ??? oxyCODONE-acetaminophen (PERCOCET) 5-325 mg per tablet Take 1 Tab by mouth every four (4) hours as needed for Pain. Max Daily Amount: 6 Tabs. 60 Tab 0   ??? amLODIPine (NORVASC) 10 mg tablet Take 1 Tab by mouth daily. 30 Tab 5   ??? chlorthalidone (HYGROTEN) 25 mg tablet Take 1 Tab by mouth daily. 30 Tab 5   ??? metoprolol tartrate (LOPRESSOR) 100 mg IR tablet Take 1 Tab by mouth two (2) times a day. 60 Tab 5   ??? SEROQUEL XR 150 mg sr tablet Take 1 Tab by mouth nightly. 30 Tab 5   ??? cloNIDine HCl (CATAPRES) 0.3 mg tablet      ??? hydrALAZINE (APRESOLINE) 50 mg tablet            REVIEW OF SYSTEM:  Review of Systems   Constitutional: Negative.    HENT: Negative for hearing loss.    Eyes: Negative for blurred vision and double vision.   Respiratory: Negative for cough, shortness of breath and wheezing.    Cardiovascular: Negative for chest pain, palpitations and PND.   Gastrointestinal: Negative for abdominal pain, heartburn, nausea and vomiting.   Genitourinary: Negative.    Musculoskeletal: Positive for joint pain. Negative for back pain, falls, myalgias and neck pain.   Neurological: Negative for dizziness, tingling, sensory change and headaches.       PHYSICAL EXAM:    Visit Vitals   ??? BP (!) 165/123 (BP 1 Location: Right arm, BP Patient Position: Sitting)   ??? Pulse 73   ??? Temp 98.7 ??F (37.1 ??C) (Oral)   ??? Resp 16   ??? Ht 5' 8" (1.727 m)   ??? Wt 177 lb (80.3 kg)   ??? BMI 26.91 kg/m2       Pain Assessment  01/26/2015   Location of Pain Hip   Location Modifiers Right   Severity of Pain 10   Quality of Pain Aching   Duration of Pain Persistent   Frequency of Pain Constant   Date Pain First Started -   Aggravating Factors Walking;Standing;Squatting;Kneeling;Exercise;Straightening;Bending;Stretching   Limiting Behavior Yes   Relieving Factors NSAID   Relieving Factors Comment -   Result of Injury No       Psychiatric: Affect and mood are appropriate.   Physical Exam Leg length is equal.   Constitutional: She is oriented to person, place, and time and well-developed, well-nourished, and in no distress.   Musculoskeletal: She exhibits edema and tenderness.   Right hip: She exhibits decreased range of motion, decreased strength and tenderness. She exhibits no swelling, no crepitus, no deformity and no laceration.   Left hip: She exhibits decreased range of motion, decreased strength and tenderness. She exhibits no swelling, no crepitus, no deformity and no laceration.   Neurological: She is alert and oriented to person, place, and time.   Skin: Skin is warm and dry.   Psychiatric: Affect and judgment normal.   Vitals reviewed.    Lab Results   Component Value Date/Time    WBC 10.7 01/16/2015 12:34 PM    HEMOGLOBIN (POC) 11.8 12/16/2014 02:13 PM    HGB 14.9 01/16/2015 12:34 PM    HCT 41.6 01/16/2015 12:34 PM    PLATELET 207 01/16/2015 12:34 PM    MCV 86.8 01/16/2015 12:34 PM     Lab Results   Component Value Date/Time    SODIUM 141 01/16/2015 12:34 PM    POTASSIUM 3.2 01/16/2015 12:34 PM      CHLORIDE 102 01/16/2015 12:34 PM    CO2 30 01/16/2015 12:34 PM         Ortho Exam  X-rays shows:  Sever right hip osteoarthritis changes with osteophytes   Left hip shows moderate OA        PLAN & RECOMMENDATION:    ICD-10-CM ICD-9-CM    1. Primary osteoarthritis of both hips M16.0 715.15 oxyCODONE-acetaminophen (PERCOCET) 5-325 mg per tablet   2. Primary osteoarthritis of right hip M16.11 715.15        Extensive discussion was held with the patient regarding the diagnosis, various  treatment options, recovery period, and possible complications including the following  1. Pt is scheduled for surgery on 01/26/15  2. Discussed with patient she should continue with her exercises after surgery.  3. I will refill her Percocet.  4. Follow up postop.    Due to the current findings, affected activity of daily living and continued pain and discomfort, [name] [name] has documentation of failure to respond?? adequately to (for a long time) of conservative therapy:which (Activity modification, home exercise, night bracing, various analgesics and injections. surgical intervention is indicated. The alternatives, risks, and complications, including but not limited to infection,?? neurovascular damage specially median nerve , peri-incisional numbness, subcutaneous hematoma,?? anesthetic complications, postoperative stiffness and pain, possible surgical scar, damage to blood vessels and nerves,failure to relieve symptoms, worsening of current symptoms, need for more surgery,?? hip dislocation leg length discrepancy. Patient Express a full understanding of the proposed procedure, alternative treatments, and complications, patient decide the benefits of the proposed surgery outweigh the potential problems associated with the intervention, the patient wishes to proceed with right total hip replacement surgery.        DISSCUSSION:  The patient was counseled on personal impression, his/her instructions for follow-up, the importance of compliance with use and treatment options and risks factor reduction.    [name] [name] has a reminder for a "due or due soon" health maintenance. I  have asked that she contact her primary care provider for follow-up on this health maintenance.    I have discussed the findings of this evaluation with the patient. The discussion included a completed verbal explanation any of changes into examination results,diagnoses and current treatment plan. A scheduled for future care needs was explained. The patient verbalized understanding of these instructions. The patient acknowledged understanding the diagnoses and treatment. If any questions should arise after returning home I have encouraged the patient to feel free to call the office at [phone].The patient understands to call 911 in the case of a true medical emergency.    The patient understands it is their responsibility of the patient to complete all prescribed medications all recommend that testing, including but not limited to, laboratory studies and imaging. The patient further understands the need to keep all scheduled  follow-up visits and to inform the office immediately for any changes in the their medical condition.  The  Patient understands that the success of treatment in large part depends on patient willingness to complete the therapeutic regiment and to work in partnership with designated health care providers.     Patient was reassured; the diagnosis was discussed and all questions were answered to the satisfaction of the patient.      Scribed by Christine Bowman (Scribekick) as dictated by Missie Gehrig M Arelia Volpe, MD.  [facility]

## 2015-01-26 NOTE — Progress Notes (Signed)
Pt is being seen today for History and Physical for Right THR.       Pt is retired.     HM reviewed with pt and informed to follow up with any outdated HM that may need to be done.      Brief history has been reviewed with pt at time of OV.

## 2015-01-26 NOTE — Telephone Encounter (Signed)
Pt called stating she is in a lot of pain, has surgery scheduled for the 18th. She would like to know if she can come in today for an shot for the pain

## 2015-01-27 ENCOUNTER — Encounter: Attending: Family Medicine | Primary: Physician Assistant

## 2015-01-27 NOTE — Progress Notes (Signed)
[facility] 01[facility]-15[facility]

## 2015-01-28 NOTE — Addendum Note (Signed)
Addended by: Roseanne Reno on: 01/28/2015 11:16 AM      Modules accepted: Level of Service

## 2015-01-29 ENCOUNTER — Inpatient Hospital Stay: Admit: 2015-01-29 | Payer: MEDICARE | Primary: Physician Assistant

## 2015-01-29 ENCOUNTER — Inpatient Hospital Stay
Admit: 2015-01-29 | Discharge: 2015-02-02 | Disposition: A | Discharge status: Skilled Nursing Facility | Payer: MEDICARE | Attending: Orthopaedic Surgery | Admitting: Orthopaedic Surgery

## 2015-01-29 DIAGNOSIS — M160 Bilateral primary osteoarthritis of hip: Secondary | ICD-10-CM

## 2015-01-29 LAB — METABOLIC PANEL, BASIC
Anion gap: 10 mmol/L (ref 3.0–18)
BUN/Creatinine ratio: 28 — ABNORMAL HIGH (ref 12–20)
BUN: 23 MG/DL — ABNORMAL HIGH (ref 7.0–18)
CO2: 27 mmol/L (ref 21–32)
Calcium: 9.2 MG/DL (ref 8.5–10.1)
Chloride: 101 mmol/L (ref 100–108)
Creatinine: 0.82 MG/DL (ref 0.6–1.3)
GFR est AA: >60 mL/min/{1.73_m2} (ref >60)
GFR est non-AA: >60 mL/min/{1.73_m2} (ref >60)
Glucose: 148 mg/dL — ABNORMAL HIGH (ref 74–99)
Potassium: 3.9 mmol/L (ref 3.5–5.5)
Sodium: 138 mmol/L (ref 136–145)

## 2015-01-29 LAB — CBC WITH AUTOMATED DIFF
ABS. BASOPHILS: 0 10*3/uL (ref 0.0–0.06)
ABS. EOSINOPHILS: 0.1 10*3/uL (ref 0.0–0.4)
ABS. LYMPHOCYTES: 2.6 10*3/uL (ref 0.9–3.6)
ABS. MONOCYTES: 1.5 10*3/uL — ABNORMAL HIGH (ref 0.05–1.2)
ABS. NEUTROPHILS: 14.7 10*3/uL — ABNORMAL HIGH (ref 1.8–8.0)
BASOPHILS: 0 % (ref 0–2)
EOSINOPHILS: 0 % (ref 0–5)
HCT: 36.3 % (ref 35.0–45.0)
HGB: 12.5 g/dL (ref 12.0–16.0)
LYMPHOCYTES: 14 % — ABNORMAL LOW (ref 21–52)
MCH: 30.4 PG (ref 24.0–34.0)
MCHC: 34.4 g/dL (ref 31.0–37.0)
MCV: 88.3 FL (ref 74.0–97.0)
MONOCYTES: 8 % (ref 3–10)
MPV: 11.8 FL (ref 9.2–11.8)
NEUTROPHILS: 78 % — ABNORMAL HIGH (ref 40–73)
PLATELET: 216 10*3/uL (ref 135–420)
RBC: 4.11 M/uL — ABNORMAL LOW (ref 4.20–5.30)
RDW: 12.5 % (ref 11.6–14.5)
WBC: 18.9 10*3/uL — ABNORMAL HIGH (ref 4.6–13.2)

## 2015-01-29 LAB — POTASSIUM: Potassium: 3.3 mmol/L — ABNORMAL LOW (ref 3.5–5.5)

## 2015-01-29 MED ORDER — AMLODIPINE 10 MG TAB
10 mg | Freq: Every day | ORAL | Status: DC
Start: 2015-01-29 — End: 2015-02-02
  Administered 2015-01-31 – 2015-02-02 (×3): via ORAL

## 2015-01-29 MED ORDER — INSULIN LISPRO 100 UNIT/ML INJECTION
100 unit/mL | SUBCUTANEOUS | Status: DC | PRN
Start: 2015-01-29 — End: 2015-01-29

## 2015-01-29 MED ORDER — VASOPRESSIN 20 UNIT/ML INTRAVENOUS SOLUTION
20 unit/mL | INTRAVENOUS | Status: DC | PRN
Start: 2015-01-29 — End: 2015-01-29
  Administered 2015-01-29 (×3): via INTRAVENOUS

## 2015-01-29 MED ORDER — FERROUS SULFATE 325 MG (65 MG ELEMENTAL IRON) TAB
325 mg (65 mg iron) | Freq: Three times a day (TID) | ORAL | Status: DC
Start: 2015-01-29 — End: 2015-02-02
  Administered 2015-01-29 – 2015-02-02 (×12): via ORAL

## 2015-01-29 MED ORDER — PROMETHAZINE 25 MG/ML INJECTION
25 mg/mL | Freq: Once | INTRAMUSCULAR | Status: DC
Start: 2015-01-29 — End: 2015-01-29

## 2015-01-29 MED ORDER — HYDRALAZINE 25 MG TAB
25 mg | Freq: Every day | ORAL | Status: DC
Start: 2015-01-29 — End: 2015-02-02
  Administered 2015-01-31 – 2015-02-02 (×3): via ORAL

## 2015-01-29 MED ORDER — GLYCOPYRROLATE 0.2 MG/ML IJ SOLN
0.2 mg/mL | INTRAMUSCULAR | Status: DC | PRN
Start: 2015-01-29 — End: 2015-01-29
  Administered 2015-01-29 (×2): via INTRAVENOUS

## 2015-01-29 MED ORDER — NEOSTIGMINE METHYLSULFATE 5 MG/5 ML (1 MG/ML) IV SYRINGE
5 mg/ mL (1 mg/mL) | INTRAVENOUS | Status: DC | PRN
Start: 2015-01-29 — End: 2015-01-29
  Administered 2015-01-29 (×2): via INTRAVENOUS

## 2015-01-29 MED ORDER — LORAZEPAM 1 MG TAB
1 mg | Freq: Every evening | ORAL | Status: DC
Start: 2015-01-29 — End: 2015-02-02
  Administered 2015-01-30 – 2015-02-02 (×4): via ORAL

## 2015-01-29 MED ORDER — HYDROMORPHONE (PF) 1 MG/ML IJ SOLN
1 mg/mL | INTRAMUSCULAR | Status: DC | PRN
Start: 2015-01-29 — End: 2015-01-29

## 2015-01-29 MED ORDER — FENTANYL CITRATE (PF) 50 MCG/ML IJ SOLN
50 mcg/mL | INTRAMUSCULAR | Status: DC | PRN
Start: 2015-01-29 — End: 2015-01-29
  Administered 2015-01-29 (×2): via INTRAVENOUS

## 2015-01-29 MED ORDER — LACTATED RINGERS IV
INTRAVENOUS | Status: DC
Start: 2015-01-29 — End: 2015-01-29
  Administered 2015-01-29: 18:00:00 via INTRAVENOUS

## 2015-01-29 MED ORDER — FAMOTIDINE 20 MG TAB
20 mg | Freq: Once | ORAL | Status: AC
Start: 2015-01-29 — End: 2015-01-29
  Administered 2015-01-29: 14:00:00 via ORAL

## 2015-01-29 MED ORDER — PROPOFOL 10 MG/ML IV EMUL
10 mg/mL | INTRAVENOUS | Status: DC | PRN
Start: 2015-01-29 — End: 2015-01-29
  Administered 2015-01-29 (×2): via INTRAVENOUS

## 2015-01-29 MED ORDER — METOPROLOL TARTRATE 50 MG TAB
50 mg | Freq: Two times a day (BID) | ORAL | Status: DC
Start: 2015-01-29 — End: 2015-02-02
  Administered 2015-01-29 – 2015-02-02 (×7): via ORAL

## 2015-01-29 MED ORDER — HYDRALAZINE 20 MG/ML IJ SOLN
20 mg/mL | Freq: Four times a day (QID) | INTRAMUSCULAR | Status: DC | PRN
Start: 2015-01-29 — End: 2015-02-02

## 2015-01-29 MED ORDER — SODIUM CHLORIDE 0.9 % IJ SYRG
Freq: Three times a day (TID) | INTRAMUSCULAR | Status: DC
Start: 2015-01-29 — End: 2015-02-02
  Administered 2015-01-29 – 2015-02-02 (×12): via INTRAVENOUS

## 2015-01-29 MED ORDER — SCOPOLAMINE (1.3-1.5) MG 72 HR TRANSDERM PATCH
1 mg over 3 days | TRANSDERMAL | Status: DC
Start: 2015-01-29 — End: 2015-01-29
  Administered 2015-01-29 – 2015-02-01 (×2): via TRANSDERMAL

## 2015-01-29 MED ORDER — ACETAMINOPHEN 325 MG TABLET
325 mg | ORAL | Status: DC | PRN
Start: 2015-01-29 — End: 2015-02-02
  Administered 2015-01-29 – 2015-01-30 (×2): via ORAL

## 2015-01-29 MED ORDER — FENTANYL CITRATE (PF) 50 MCG/ML IJ SOLN
50 mcg/mL | INTRAMUSCULAR | Status: DC | PRN
Start: 2015-01-29 — End: 2015-01-29
  Administered 2015-01-29 (×3): via INTRAVENOUS

## 2015-01-29 MED ORDER — CEFAZOLIN 2 GM/50 ML IN DEXTROSE (ISO-OSMOTIC) IVPB
2 gram/50 mL | Freq: Three times a day (TID) | INTRAVENOUS | Status: AC
Start: 2015-01-29 — End: 2015-02-01
  Administered 2015-01-29 – 2015-01-30 (×2): via INTRAVENOUS

## 2015-01-29 MED ORDER — SODIUM CHLORIDE 0.9 % IRRIGATION SOLN
0.9 % | Status: DC | PRN
Start: 2015-01-29 — End: 2015-01-29
  Administered 2015-01-29: 17:00:00

## 2015-01-29 MED ORDER — HYDROMORPHONE (PF) 1 MG/ML IJ SOLN
1 mg/mL | INTRAMUSCULAR | Status: DC | PRN
Start: 2015-01-29 — End: 2015-02-02
  Administered 2015-01-30 – 2015-02-02 (×22): via INTRAVENOUS

## 2015-01-29 MED ORDER — QUETIAPINE SR 50 MG 24 HR TAB
50 mg | Freq: Every evening | ORAL | Status: DC
Start: 2015-01-29 — End: 2015-02-02
  Administered 2015-01-30 – 2015-02-02 (×4): via ORAL

## 2015-01-29 MED ORDER — SCOPOLAMINE (1.3-1.5) MG 72 HR TRANSDERM PATCH
1 mg over 3 days | TRANSDERMAL | Status: AC
Start: 2015-01-29 — End: ?

## 2015-01-29 MED ORDER — POTASSIUM CHLORIDE SR 20 MEQ TAB, PARTICLES/CRYSTALS
20 mEq | ORAL | Status: AC
Start: 2015-01-29 — End: 2015-01-29
  Administered 2015-01-29: 21:00:00 via ORAL

## 2015-01-29 MED ORDER — DIPHENHYDRAMINE HCL 50 MG/ML IJ SOLN
50 mg/mL | INTRAMUSCULAR | Status: DC | PRN
Start: 2015-01-29 — End: 2015-02-02

## 2015-01-29 MED ORDER — POTASSIUM CHLORIDE SR 20 MEQ TAB, PARTICLES/CRYSTALS
20 mEq | ORAL | Status: DC
Start: 2015-01-29 — End: 2015-01-29
  Administered 2015-01-29: 20:00:00 via ORAL

## 2015-01-29 MED ORDER — FENTANYL CITRATE (PF) 50 MCG/ML IJ SOLN
50 mcg/mL | INTRAMUSCULAR | Status: DC | PRN
Start: 2015-01-29 — End: 2015-01-29
  Administered 2015-01-29: 18:00:00 via INTRAVENOUS

## 2015-01-29 MED ORDER — CEFAZOLIN 2 GM/50 ML IN DEXTROSE (ISO-OSMOTIC) IVPB
2 gram/50 mL | INTRAVENOUS | Status: AC
Start: 2015-01-29 — End: 2015-01-29
  Administered 2015-01-29: 15:00:00 via INTRAVENOUS

## 2015-01-29 MED ORDER — HYDROCODONE-ACETAMINOPHEN 7.5 MG-325 MG TAB
ORAL | Status: DC | PRN
Start: 2015-01-29 — End: 2015-02-02
  Administered 2015-01-29 – 2015-02-02 (×18): via ORAL

## 2015-01-29 MED ORDER — SODIUM CHLORIDE 0.9 % IV
INTRAVENOUS | Status: AC
Start: 2015-01-29 — End: 2015-01-30
  Administered 2015-01-29 – 2015-01-30 (×2): via INTRAVENOUS

## 2015-01-29 MED ORDER — FENTANYL CITRATE (PF) 50 MCG/ML IJ SOLN
50 mcg/mL | INTRAMUSCULAR | Status: AC
Start: 2015-01-29 — End: 2015-01-29
  Administered 2015-01-29: 18:00:00 via INTRAVENOUS

## 2015-01-29 MED ORDER — BISACODYL 5 MG TAB, DELAYED RELEASE
5 mg | Freq: Every day | ORAL | Status: DC | PRN
Start: 2015-01-29 — End: 2015-02-02
  Administered 2015-02-02: 14:00:00 via ORAL

## 2015-01-29 MED ORDER — ONDANSETRON 8 MG TAB, RAPID DISSOLVE
8 mg | ORAL | Status: DC | PRN
Start: 2015-01-29 — End: 2015-02-02

## 2015-01-29 MED ORDER — EPHEDRINE SULFATE 50 MG/ML IJ SOLN
50 mg/mL | INTRAMUSCULAR | Status: DC | PRN
Start: 2015-01-29 — End: 2015-01-29
  Administered 2015-01-29 (×3): via INTRAVENOUS

## 2015-01-29 MED ORDER — HYDROMORPHONE 2 MG/ML INJECTION SOLUTION
2 mg/mL | INTRAMUSCULAR | Status: AC
Start: 2015-01-29 — End: 2015-01-29
  Administered 2015-01-29: 18:00:00 via INTRAVENOUS

## 2015-01-29 MED ORDER — HYDROMORPHONE (PF) 2 MG/ML IJ SOLN
2 mg/mL | Freq: Four times a day (QID) | INTRAMUSCULAR | Status: DC | PRN
Start: 2015-01-29 — End: 2015-01-29

## 2015-01-29 MED ORDER — CLONIDINE 0.1 MG TAB
0.1 mg | Freq: Two times a day (BID) | ORAL | Status: DC
Start: 2015-01-29 — End: 2015-02-02
  Administered 2015-01-29 – 2015-02-02 (×7): via ORAL

## 2015-01-29 MED ORDER — HYDROMORPHONE (PF) 1 MG/ML IJ SOLN
1 mg/mL | INTRAMUSCULAR | Status: DC | PRN
Start: 2015-01-29 — End: 2015-01-29
  Administered 2015-01-29: 20:00:00 via INTRAVENOUS

## 2015-01-29 MED ORDER — ONDANSETRON (PF) 4 MG/2 ML INJECTION
4 mg/2 mL | INTRAMUSCULAR | Status: DC | PRN
Start: 2015-01-29 — End: 2015-01-29
  Administered 2015-01-29: 17:00:00 via INTRAVENOUS

## 2015-01-29 MED ORDER — HYDROCODONE-ACETAMINOPHEN 5 MG-325 MG TAB
5-325 mg | ORAL | Status: DC | PRN
Start: 2015-01-29 — End: 2015-01-29

## 2015-01-29 MED ORDER — HYDROMORPHONE 2 MG/ML INJECTION SOLUTION
2 mg/mL | INTRAMUSCULAR | Status: AC
Start: 2015-01-29 — End: ?

## 2015-01-29 MED ORDER — SODIUM CHLORIDE 0.9 % IJ SYRG
INTRAMUSCULAR | Status: DC | PRN
Start: 2015-01-29 — End: 2015-02-02

## 2015-01-29 MED ORDER — LACTATED RINGERS BOLUS IV
Freq: Once | INTRAVENOUS | Status: AC
Start: 2015-01-29 — End: 2015-01-29
  Administered 2015-01-29: 17:00:00 via INTRAVENOUS

## 2015-01-29 MED ORDER — CHLORTHALIDONE 25 MG TAB
25 mg | Freq: Every day | ORAL | Status: DC
Start: 2015-01-29 — End: 2015-02-02
  Administered 2015-01-31 – 2015-02-02 (×3): via ORAL

## 2015-01-29 MED ORDER — ROPIVACAINE (PF) 5 MG/ML (0.5 %) INJECTION
5 mg/mL (0. %) | INTRAMUSCULAR | Status: AC | PRN
Start: 2015-01-29 — End: 2015-01-29
  Administered 2015-01-29 (×2): via INTRA_ARTICULAR

## 2015-01-29 MED ORDER — ENOXAPARIN 40 MG/0.4 ML SUB-Q SYRINGE
40 mg/0.4 mL | Freq: Every day | SUBCUTANEOUS | Status: DC
Start: 2015-01-29 — End: 2015-02-02
  Administered 2015-01-30 – 2015-02-02 (×3): via SUBCUTANEOUS

## 2015-01-29 MED ORDER — HYDROMORPHONE 2 MG/ML INJECTION SOLUTION
2 mg/mL | INTRAMUSCULAR | Status: DC | PRN
Start: 2015-01-29 — End: 2015-01-29

## 2015-01-29 MED ORDER — HYDROMORPHONE 2 MG/ML INJECTION SOLUTION
2 mg/mL | Freq: Four times a day (QID) | INTRAMUSCULAR | Status: DC | PRN
Start: 2015-01-29 — End: 2015-01-29
  Administered 2015-01-29: 18:00:00 via INTRAVENOUS

## 2015-01-29 MED ORDER — DIPHENHYDRAMINE HCL 50 MG/ML IJ SOLN
50 mg/mL | INTRAMUSCULAR | Status: DC | PRN
Start: 2015-01-29 — End: 2015-01-29

## 2015-01-29 MED ORDER — MIDAZOLAM 1 MG/ML IJ SOLN
1 mg/mL | INTRAMUSCULAR | Status: AC
Start: 2015-01-29 — End: ?

## 2015-01-29 MED ORDER — FENTANYL CITRATE (PF) 50 MCG/ML IJ SOLN
50 mcg/mL | INTRAMUSCULAR | Status: AC
Start: 2015-01-29 — End: ?

## 2015-01-29 MED ORDER — LIDOCAINE (PF) 20 MG/ML (2 %) IJ SOLN
20 mg/mL (2 %) | INTRAMUSCULAR | Status: DC | PRN
Start: 2015-01-29 — End: 2015-01-29
  Administered 2015-01-29: 15:00:00 via INTRAVENOUS

## 2015-01-29 MED ORDER — ROCURONIUM 10 MG/ML IV
10 mg/mL | INTRAVENOUS | Status: DC | PRN
Start: 2015-01-29 — End: 2015-01-29
  Administered 2015-01-29 (×2): via INTRAVENOUS

## 2015-01-29 MED ORDER — LACTATED RINGERS IV
INTRAVENOUS | Status: DC
Start: 2015-01-29 — End: 2015-01-29
  Administered 2015-01-29 (×2): via INTRAVENOUS

## 2015-01-29 MED ORDER — DIPHENHYDRAMINE 25 MG CAP
25 mg | ORAL | Status: DC | PRN
Start: 2015-01-29 — End: 2015-02-02

## 2015-01-29 MED FILL — MIDAZOLAM 1 MG/ML IJ SOLN: 1 mg/mL | INTRAMUSCULAR | Qty: 2

## 2015-01-29 MED FILL — BD POSIFLUSH NORMAL SALINE 0.9 % INJECTION SYRINGE: INTRAMUSCULAR | Qty: 10

## 2015-01-29 MED FILL — NAROPIN (PF) 5 MG/ML (0.5 %) INJECTION SOLUTION: 5 mg/mL (0. %) | INTRAMUSCULAR | Qty: 30

## 2015-01-29 MED FILL — HYDROMORPHONE 2 MG/ML INJECTION SOLUTION: 2 mg/mL | INTRAMUSCULAR | Qty: 1

## 2015-01-29 MED FILL — FAMOTIDINE 20 MG TAB: 20 mg | ORAL | Qty: 1

## 2015-01-29 MED FILL — POTASSIUM CHLORIDE SR 20 MEQ TAB, PARTICLES/CRYSTALS: 20 mEq | ORAL | Qty: 1

## 2015-01-29 MED FILL — LACTATED RINGERS IV: INTRAVENOUS | Qty: 1000

## 2015-01-29 MED FILL — SCOPOLAMINE (1.3-1.5) MG 72 HR TRANSDERM PATCH: 1 mg over 3 days | TRANSDERMAL | Qty: 1

## 2015-01-29 MED FILL — POTASSIUM CHLORIDE SR 10 MEQ TAB, PARTICLES/CRYSTALS: 10 mEq | ORAL | Qty: 2

## 2015-01-29 MED FILL — TYLENOL 325 MG TABLET: 325 mg | ORAL | Qty: 2

## 2015-01-29 MED FILL — FERROUS SULFATE 325 MG (65 MG ELEMENTAL IRON) TAB: 325 mg (65 mg iron) | ORAL | Qty: 1

## 2015-01-29 MED FILL — CEFAZOLIN 2 GM/50 ML IN DEXTROSE (ISO-OSMOTIC) IVPB: 2 gram/50 mL | INTRAVENOUS | Qty: 50

## 2015-01-29 MED FILL — FENTANYL CITRATE (PF) 50 MCG/ML IJ SOLN: 50 mcg/mL | INTRAMUSCULAR | Qty: 2

## 2015-01-29 MED FILL — FENTANYL CITRATE (PF) 50 MCG/ML IJ SOLN: 50 mcg/mL | INTRAMUSCULAR | Qty: 5

## 2015-01-29 MED FILL — SODIUM CHLORIDE 0.9 % IV: INTRAVENOUS | Qty: 1000

## 2015-01-29 MED FILL — HYDROMORPHONE (PF) 1 MG/ML IJ SOLN: 1 mg/mL | INTRAMUSCULAR | Qty: 1

## 2015-01-29 MED FILL — HYDROCODONE-ACETAMINOPHEN 7.5 MG-325 MG TAB: ORAL | Qty: 1

## 2015-01-29 MED FILL — METOPROLOL TARTRATE 50 MG TAB: 50 mg | ORAL | Qty: 2

## 2015-01-29 MED FILL — CLONIDINE 0.1 MG TAB: 0.1 mg | ORAL | Qty: 3

## 2015-01-29 MED FILL — HYDROMORPHONE (PF) 2 MG/ML IJ SOLN: 2 mg/mL | INTRAMUSCULAR | Qty: 1

## 2015-01-29 NOTE — Anesthesia Pre-Procedure Evaluation (Signed)
Anesthetic History   No history of anesthetic complications            Review of Systems / Medical History  Patient summary reviewed and pertinent labs reviewed    Pulmonary        Sleep apnea  Undiagnosed apnea         Neuro/Psych   Within defined limits           Cardiovascular    Hypertension              Exercise tolerance: >4 METS     GI/Hepatic/Renal     GERD           Endo/Other  Within defined limits           Other Findings              Physical Exam    Airway  Mallampati: III  TM Distance: 4 - 6 cm  Neck ROM: normal range of motion   Mouth opening: Normal     Cardiovascular  Regular rate and rhythm,  S1 and S2 normal,  no murmur, click, rub, or gallop  Rhythm: regular  Rate: normal         Dental    Dentition: Lower dentition intact and Upper dentition intact     Pulmonary  Breath sounds clear to auscultation               Abdominal  GI exam deferred       Other Findings            Anesthetic Plan    ASA: 3  Anesthesia type: general          Induction: Intravenous  Anesthetic plan and risks discussed with: Patient

## 2015-01-29 NOTE — Anesthesia Post-Procedure Evaluation (Signed)
Post-Anesthesia Evaluation and Assessment    Patient: [name] [name] MRN: [id number]  SSN: [id number]    Date of Birth: 04/28/1951  Age: 64 y.o.  Sex: female       Cardiovascular Function/Vital Signs  Visit Vitals   ??? BP 113/80   ??? Pulse 70   ??? Temp 36.6 ??C (97.8 ??F)   ??? Resp 16   ??? Ht 5\' 7"  (1.702 m)   ??? Wt 80 kg (176 lb 5 oz)   ??? SpO2 98%   ??? BMI 27.61 kg/m2       Patient is status post general anesthesia for Procedure(s):  right total hip replacement - anterior approach.    Nausea/Vomiting: None    Postoperative hydration reviewed and adequate.    Pain:  Pain Scale 1: Visual (01/29/15 1411)  Pain Intensity 1: 3 (01/29/15 1411)   Managed    Neurological Status:   Neuro (WDL): Within Defined Limits (01/29/15 1511)  Neuro  Neurologic State: Drowsy;Appropriate for age (01/29/15 1511)  LUE Motor Response: Moderate (01/29/15 1511)  LLE Motor Response: Moderate (01/29/15 1511)  RUE Motor Response: Moderate (01/29/15 1511)  RLE Motor Response: Purposeful;Spontaneous  (01/29/15 1511)   At baseline    Mental Status and Level of Consciousness: Alert and oriented     Pulmonary Status:   O2 Device: Room air (01/29/15 1511)   Adequate oxygenation and airway patent    Complications related to anesthesia: None    Post-anesthesia assessment completed. No concerns    Signed By: Abelina Bacheloravid L Jalaine Riggenbach, MD     January 29, 2015

## 2015-01-29 NOTE — Op Note (Signed)
RIGHT  DIRECT ANTERIOR HIP REPLACEMENT    Patient: [name] [name] MRN: [id number]  CSN: [id number]   Date of Birth: 16-May-1951  Age: 64 y.o.  Sex: female      Date of Procedure: 01/29/2015   Preoperative Diagnosis: osteroarthritis bilateral hip  Postoperative Diagnosis: osteroarthritis bilateral hip   Procedure(s):  right total hip replacement - anterior approach  Surgeon(s) and Role:  * Madie Reno, MD - Primary  ????  ??  Surgical Staff:  Circ-1: Alroy Bailiff, RN  Scrub Tech-1: Garald Balding  Surg Asst-1: Posey Pronto  Event?? Time In??   Incision Start?? 1144??   Incision Close?? ??   ??  Anesthesia: General ??  Estimated Blood Loss: 300 ml  Specimens: * No specimens in log *   Findings: sever osteoarthritis     Complications: none   Implants:       IMPLANTS:   Implant Name Type Inv. Item Serial No. Manufacturer Lot No. LRB No. Used Action   SHELL TRITAN HEMI CLSTR D --  - [id number]  SHELL TRITAN HEMI CLSTR D --   [employer] HOWM [id number] Right 1 Implanted   INSERT ACET 0DEG D X3 --  - [id number]  INSERT ACET 0DEG D X3 --   [employer] HOWM [id number] Right 1 Implanted   STEM FEM SZ 6 127D 35X111MM -- ACCOLADE II V40 - [id number]  STEM FEM SZ 6 127D 35X111MM -- ACCOLADE II V40  [employer] HOWM [id number] Right 1 Implanted   HEAD FEM DELT V40 - NK -- V40 BIOLOX - [id number]   HEAD FEM DELT V40 - NK -- V40 BIOLOX   [employer] HOWM [id number] Right 1 Implanted           INDICATIONS:  A 64 y.o. African American female with right severe coxarthrosis documented by clinical exam and x-ray.  The patient has continued pain with all ADL's despite conservative treatment and now presents for total hip arthroplasty. The patient notices the operative leg to have a 4 mm leg length discrepency clinically.  The patient does not feel that the operative leg is Shorter than the nonoperative leg.     OPERATION:  The patient was brought into the operating theater, and after adequate appropriate anesthesia the patient was placed on the Hana table. The right hip was prepped and draped for typical anterior hip surgery. This leg length was verified preoperatively. A 8 cm incision was then made, 2 cm lateral to the anterior superior iliac spine, and 2 cm distal. The incision was deepened down to the fascia of the tensor fascia lata muscle, where the fascia was incised the length of the wound. A Cobra was placed just lateral to the anterior inferior iliac spine and just lateral to the capsule of the hip over the proximal neck. The tensor fascia muscle was then retracted with the Hibbs, and the rectus femoris was retracted medially with another Hibbs. The ascending branches of the lateral femoral circumflex vessels were then clamped and electrocauterized. Using a cobb elevator, the soft tissue was elevated off the anterior part of the femoral capsule, and a Cobra retractor was placed medially. An anterolateral capsulotomy was then performed, from the acetabulum to the intertrochanteric line. The lateral capsule was excised, and the anterior capsule was elevated off the medial neck.  The leg was then slightly externally rotated with traction and the neck cut was done about 1 fingerbreadth above the lesser trochanter.  The corkscrew was inserted into the femoral head and it was removed easily rotating the head 180 degrees. A Cobra retractor was placed behind the acetabulum. A skinny Hohmann retractor was placed on the anterior part of the acetabulum rim, and then the labrum and any osteophytes around the acetabulum were resected. The pulvinar in the fovea was resected, and then the acetabulum was reamed in 45 degrees of abduction and the patient's natural anteversion. The reamer was medialized to the base of the fovea and then widened to 1mm less than the actual cup to be implanted. There was good peripheral fit with good  bleeding bone. An  appropriately sized trident Hemispherical Acetabular Cup was selected to be implanted. The acetabulum was Simpulse lavage irrigated and then dried with a sponge stick. The cup was then seated fully with excellent purchase and good stability. The final abduction was 45 degrees and the anteversion was 15 degrees.  The anterior lip of the cup was just inside the natural acetabulum. Then appropriately sized acetabular liner was then Morse-tapered into position.   No screws were used in the acetabular cup.    Attention was now turned to the femur. The femoral hook was placed behind the femur. Traction was taken off the leg, and the hip was maximally externally rotated, adducted and extended. The proximal femur was then brought up into the wound as maximally as possible. A Mueller retractor was placed on the medial neck, and a large Hohmann was placed around the greater trochanter. The capsule, the obturator internus and the piriformis were then released from the inside of the greater trochanter, from anterior to posterior. The patient had excellent mobility of his femur.  The bone closest to the greater trochanter, at the femoral neck, was then removed with a rongeur. A box osteotome was then used to open the canal, then the lateralizer, the starter broach and finally by the zero broach. This was then broached up to the appropriately size progressively, following the anteversion of the posterior neck of the femur. Once the broach was seated completely the calcar was planed to make the femoral neck cut smooth and even. There was excellent stability on torquing the femoral broach in the femoral canal. The patient was trialed with a neck and with different neck lengths. The femur was reduced in the acetabulum and the hip was checked for stability clinically and X-ray was taken with fluoroscopy to check prosthesis position and the leg lengths. The  appropriately sized femoral head gave excellent anterior stability. The hip could be rotated externally 90 degrees at the knee and placing a bone hook behind the femoral neck it could not be dislocated anteriorly. The leg length was 0 mm compared to pre-incision measurement. These components were selected for implantation. All trial components were removed. The femur was Simpulse lavaged, and the appropriately sized accolade 2 femoral stem was impacted down the femur, with excellent purchase and rotational stability. The appropriately sized femoral head was Morse tapered on top of the femoral component, reduced into the socket, and the patient had the exact same stability characteristics and leg lengths as noted previously. The wound was irrigated out. Periarticular infiltration using Rabivacain mixture (110 ml saline solution containing 300 mg ropivacaine, 30 mg ketorolac, 10 mg morphine, and 0.25 ml epinephrine (1:1000) was used for injecting the skin, the tensor muscle, the rectus femoris, the vastus lateralis and the medial capsule.The fascia of the tensor muscle was closed with a running locking #1 Vicryl. Subcutaneous layer was closed  with 2-0 Vicryls The skin was closed with stapler. The wound was dressed with a Mepilex dressing. The patient returned to the recovery room awake and in stable condition. All instrument, sponge, and needle counts were correct. The patient had the appropriate leg lengths when they were on the hospital bed.     Madie Reno, MD  01/29/2015

## 2015-01-29 NOTE — Other (Addendum)
Care of pt assumed. Assessment completed. Scop patch noted behind left ear and right ear. Dr. Dierdre Searles spoke w/ Dr. Joyce Gross & removed patch behind right ear.     Med by CRNA on arrival for pain. Pt taking few ice chips.     Dr. Loma Messing in to see pt.    Yetta Flock.PA in to see pt.     S, Henahan called for addiitonal pain orders.     Dr. Joyce Gross called for additional pain orders.    Dr. Joyce Gross called for additonal pain orders.     Radiology called for films.     1450 Hip films taken.     1455 Pt placed her dentures.     Husband given update & room number.    1503  TRANSFER - OUT REPORT: K+ low, C. Dudek left orders for floor    Verbal report given to Cicero on [name] [name]  being transferred to 2203 for routine post - op       Report consisted of patient???s Situation, Background, Assessment and   Recommendations(SBAR).     Information from the following report(s) SBAR, OR Summary, Intake/Output, MAR and Recent Results was reviewed with the receiving nurse.    Lines:   Peripheral IV 01/29/15 Right Hand (Active)   Site Assessment Clean, dry, & intact 01/29/2015  2:57 PM   Phlebitis Assessment 0 01/29/2015  2:57 PM   Infiltration Assessment 0 01/29/2015  2:57 PM   Dressing Status Clean, dry, & intact 01/29/2015  2:57 PM   Dressing Type Tape;Transparent 01/29/2015  2:57 PM   Hub Color/Line Status Pink;Infusing 01/29/2015  9:30 AM        Opportunity for questions and clarification was provided.      Patient transported with:   O2 @ 2 liters

## 2015-01-29 NOTE — Interval H&P Note (Signed)
H&P Update:  [name] [name] was seen and examined.  History and physical has been reviewed. The patient has been examined. There have been no significant clinical changes since the completion of the originally dated History and Physical.  Patient identified by surgeon; surgical site was confirmed by patient and surgeon.    Signed By: Madie RenoUosife M Duvan Mousel, MD     January 29, 2015 10:50 AM

## 2015-01-29 NOTE — Consults (Signed)
[facility]  Hospitalist Division    Consult Note    Patient: [name] [name] MRN: [id number]  CSN: [id number]    Date of Birth: 09/24/1950  Age: 64 y.o.  Sex: female    DOA: 01/29/2015 LOS:  LOS: 0 days        Requesting Physician:  Dr. Donato SchultzAlfahd  Reason for Consultation:  Medical Management    Chief Complaint:  Right hip pain    Assessment/Plan     Patient Active Problem List   Diagnosis Code   ??? Essential hypertension I10   ??? 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 PTSD (post-traumatic stress disorder) F43.10       A/P:  - Right hip arthritis - s/p Right total hip replacement. PRN pain control. Mobility and diet advancement per surgery team.  - HTN - Continue Norvasc/ Chlorthalidone/ Clonidine/ Hydralazine/ Lopressor. PRN Hydralazine  - PTDS - Continue bedtime Seroquel  - Lovenox/ SCD's for DVT Prophylaxis      HPI:     [name] ShutterMonica R Rookstool is a 64 y.o. female with a hx of HTN, Hepatitis C, PTSD, and GERD who was [facility] 01[facility]29/15 after undergoing a right total hip replacement for osteoarthritis. She complains of long standing right hip pain. The pain limits her ambulation and she requires the use of a can. She has pain that travels down her leg to her knee. PACU is working on getting her pain under control. The Hospitalist team has been consulted for medical management.       Past Medical History   Diagnosis Date   ??? Arthritis    ??? Chronic pain    ??? GERD (gastroesophageal reflux disease)    ??? Hypertension    ??? Liver disease      HEP C   ??? PTSD (post-traumatic stress disorder)      lived [place]       Past Surgical History   Procedure Laterality Date   ??? Hx gyn  2010     hysterectomy   ??? Hx endoscopy         Family History   Problem Relation Age of Onset   ??? Hypertension Mother    ??? Cancer Maternal Aunt    ??? Alcohol abuse Maternal Grandfather        Social History     Social History   ??? Marital status: MARRIED      Spouse name: N/A   ??? Number of children: N/A   ??? Years of education: N/A     Social History Main Topics   ??? Smoking status: Never Smoker   ??? Smokeless tobacco: Never Used   ??? Alcohol use No   ??? Drug use: No   ??? Sexual activity: No     Other Topics Concern   ??? None     Social History Narrative       Prior to Admission medications    Medication Sig Start Date End Date Taking? Authorizing Provider   amLODIPine (NORVASC) 10 mg tablet Take 1 Tab by mouth daily. 12/10/14  Yes Jolson Mollie GermanyK Tharakan, MD   chlorthalidone (HYGROTEN) 25 mg tablet Take 1 Tab by mouth daily. 12/10/14  Yes Jolson Mollie GermanyK Tharakan, MD   metoprolol tartrate (LOPRESSOR) 100 mg IR tablet Take 1 Tab by mouth two (2) times a day. 12/10/14  Yes Jolson Mollie GermanyK Tharakan, MD   SEROQUEL XR 150 mg sr tablet Take 1  Tab by mouth nightly. 12/02/14  Yes Littie Deeds, MD   cloNIDine HCl (CATAPRES) 0.3 mg tablet  07/29/14  Yes Historical Provider   hydrALAZINE (APRESOLINE) 50 mg tablet  08/19/14  Yes Historical Provider       No Known Allergies    Review of Systems  - fever, - chills, - fatigue, - weight loss, - night sweats   - sore throat, - sinus congestion, - lymphadenopathy, - vision changes  - CP, -  palpitations  - dyspnea on exertion, - dyspnea at rest, - cough, - hemoptysis  - nausea, - vomiting, - diarrhea, - abdominal pain, - reflux, - dysphagia  - dysuria, - hematuria, - urinary frequency  - rash, - pruritis  - back pain, - neck pain, - myalgia, + right hip pain  - H/A, - numbness, - tingling, - weakness, - slurred speech    Physical Exam:      Visit Vitals   ??? BP 123/86 (BP 1 Location: Left arm, BP Patient Position: At rest)   ??? Pulse 64   ??? Temp 99 ??F (37.2 ??C)   ??? Resp 16   ??? Ht 5\' 7"  (1.702 m)   ??? Wt 80 kg (176 lb 5 oz)   ??? SpO2 96%   ??? BMI 27.61 kg/m2       Physical Exam:  Gen: In general, this is a well nourished female, in no acute distress on RA. Tearful and in pain  HEENT: Sclerae nonicteric. Oral mucous membranes moist.     Neck: Supple with midline trachea.   CV: RRR without murmur or rub appreciated.   Resp:Respirations are unlabored without use of accessory muscles. Lung fields bilaterally without wheezes or rhonchi.   Abd: Soft, nontender, nondistended.  Extrem: Extremities are warm, without cyanosis or clubbing. No pitting pretibial edema. Palpable distal pulses X 4.   Skin: Warm, no visible rashes.  Neuro: Patient is alert, oriented, and cooperative. No obvious focal defects. Moves all 4 extremities.    Labs Reviewed:    Recent Results (from the past 24 hour(s))   POTASSIUM    Collection Time: 01/29/15  9:51 AM   Result Value Ref Range    Potassium 3.3 (L) 3.5 - 5.5 mmol/L     Lab Results   Component Value Date/Time    WBC 10.7 01/16/2015 12:34 PM    HEMOGLOBIN (POC) 11.8 12/16/2014 02:13 PM    HGB 14.9 01/16/2015 12:34 PM    HCT 41.6 01/16/2015 12:34 PM    PLATELET 207 01/16/2015 12:34 PM    MCV 86.8 01/16/2015 12:34 PM     Lab Results   Component Value Date/Time    SODIUM 141 01/16/2015 12:34 PM    POTASSIUM 3.3 01/29/2015 09:51 AM    CHLORIDE 102 01/16/2015 12:34 PM    CO2 30 01/16/2015 12:34 PM    ANION GAP 9 01/16/2015 12:34 PM    GLUCOSE 130 01/16/2015 12:34 PM    BUN 15 01/16/2015 12:34 PM    CREATININE 0.67 01/16/2015 12:34 PM    BUN/CREATININE RATIO 22 01/16/2015 12:34 PM    GFR EST AA >60 01/16/2015 12:34 PM    GFR EST NON-AA >60 01/16/2015 12:34 PM    CALCIUM 9.6 01/16/2015 12:34 PM       Imaging Reviewed:    None    [facility]  Hospitalist Division  Pager:  [phone]  Office:  [phone]

## 2015-01-29 NOTE — Progress Notes (Addendum)
1550:  Patient arrived on unit.  1554: Penny from PACU called to inform me of K 3.3.  Low urine output of 60 mL post bolus.  1908:  Hospitalist, Dr. Romie Jumperhukwuma paged.  Patient pain is not being well managed on current orders.

## 2015-01-29 NOTE — Brief Op Note (Signed)
BRIEF OPERATIVE NOTE    Date of Procedure: 01/29/2015   Preoperative Diagnosis: osteroarthritis bilateral hip  Postoperative Diagnosis: osteroarthritis bilateral hip    Procedure(s):  right total hip replacement - anterior approach  Surgeon(s) and Role:     * Madie Reno, MD - Primary            Surgical Staff:  Circ-1: Alroy Bailiff, RN  Scrub Tech-1: Garald Balding  Surg Asst-1: Posey Pronto  Event Time In   Incision Start 1144   Incision Close      Anesthesia: General   Estimated Blood Loss: 300 ml  Specimens: * No specimens in log *   Findings: sever osteoarthritis     Complications: none   Implants:   Implant Name Type Inv. Item Serial No. Manufacturer Lot No. LRB No. Used Action   SHELL TRITAN HEMI CLSTR D --  - [id number]  SHELL TRITAN HEMI CLSTR D --   [employer] HOWM [id number] Right 1 Implanted   INSERT ACET 0DEG D X3 --  - [id number]  INSERT ACET 0DEG D X3 --   [employer] HOWM [id number] Right 1 Implanted   STEM FEM SZ 6 127D 35X111MM -- ACCOLADE II V40 - [id number]  STEM FEM SZ 6 127D 35X111MM -- ACCOLADE II V40  [employer] HOWM [id number] Right 1 Implanted   HEAD FEM DELT V40 - NK -- V40 BIOLOX - [id number]   HEAD FEM DELT V40 - NK -- V40 BIOLOX   [employer] HOWM [id number] Right 1 Implanted

## 2015-01-29 NOTE — Progress Notes (Signed)
Chaplain conducted an initial consultation and Spiritual Assessment for [name] [name], who is a 64 y.o.,female. Patient???s Primary Language is: [place].   According to the patient???s EMR Religious Affiliation is: Baptist.     The reason the Patient came to the hospital is:   Patient Active Problem List    Diagnosis Date Noted   ??? 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 PTSD (post-traumatic stress disorder) 01/29/2015   ??? Essential hypertension 07/21/2014        The Chaplain provided the following Interventions:  Initiated a relationship of care and support with patient shortly after her surgery, and found patient still a bit on the sleepy side.  Shared with her information about the department and of our presence here for her.  Patient was having a hard time communicating so I said a prayer and left.      The following outcomes were achieved:  Patient processed feeling about current hospitalization.  Patient expressed gratitude for pastoral care visit.    Assessment:  Patient does not have any religious/cultural needs that will affect patient???s preferences in health care.  There are no further spiritual or religious issues which require Spiritual Care Services interventions at this time.     Plan:  Chaplains will continue to follow and will provide pastoral care on an as needed/requested basis    .Dewaine Oats   Spiritual Care   [phone]

## 2015-01-29 NOTE — Other (Signed)
husband- [name] [name]- ok to speak to

## 2015-01-29 NOTE — Op Note (Signed)
Op Notes by Daleen SquibbAlfahd,  Shantanu Strauch M at 01/29/15 1309                Author: Madie RenoAlfahd, Semisi Biela M  Service: Orthopedic Surgery  Author Type: Physician       Filed: 01/29/15 1313  Date of Service: 01/29/15 1309  Status: Signed          Editor: Madie RenoAlfahd, Arantza Darrington M                                                                           RIGHT  DIRECT ANTERIOR HIP REPLACEMENT          Patient: [name] ShutterMonica R Kihn  MRN: [id number]   CSN: [id number]         Date of Birth: 10/26/1950   Age: 64 y.o.   Sex: female         Date of Procedure: 01/29/2015    Preoperative Diagnosis: osteroarthritis bilateral hip   Postoperative Diagnosis: osteroarthritis bilateral hip    Procedure(s):   right total hip replacement - anterior approach   Surgeon(s) and Role:   * Madie RenoUosife M Zea Kostka, MD - Primary   ????   ??   Surgical Staff:   Circ-1: Alroy Bailiffesiree S Lilley, RN   Scrub Tech-1: Garald BaldingJames F Blount   Surg Asst-1: Posey ProntoJohn R Wright      Event??  Time In??     Incision Start??  1144??     Incision Close??  ??     ??   Anesthesia: General ??   Estimated Blood Loss: 300 ml   Specimens: * No specimens in log *    Findings: sever osteoarthritis      Complications: none    Implants:          IMPLANTS:              Implant Name  Type  Inv. Item  Serial No.  Manufacturer  Lot No.  LRB  No. Used  Action     SHELL TRITAN HEMI CLSTR D 50MM --  - [id number]    SHELL TRITAN HEMI CLSTR D 50MM --     [employer]  [id number]  Right  1  Implanted     INSERT ACET 0DEG 32MM D X3 --  - [id number]    INSERT ACET 0DEG 32MM D X3 --     [employer]  [id number]  Right  1  Implanted               STEM FEM SZ 6 127D 35X111MM -- ACCOLADE II V40 - [id number]    STEM FEM SZ 6 127D 35X111MM -- ACCOLADE II V40    [employer]  [id number]  Right  1  Implanted               HEAD FEM DELT V40 -4MM NK 32MM -- V40 BIOLOX - [id number]     HEAD FEM DELT V40 -4MM NK 32MM -- V40 BIOLOX     [employer]  [id number]  Right  1  Implanted                 INDICATIONS:  A 64 y.o. African  American female with right  severe coxarthrosis documented by clinical exam and x-ray.  The patient has continued pain with all ADL's despite conservative treatment and now presents for total hip arthroplasty. The patient  notices the operative leg to have a 4 mm leg length discrepency clinically.  The patient does not feel that the operative leg is Shorter than the nonoperative leg.      OPERATION:  The patient was brought into the operating theater, and after adequate appropriate anesthesia the patient was placed on the  Hana table. The right hip was prepped and draped for typical anterior hip surgery. This leg length was verified preoperatively. A 8  cm incision was then made, 2 cm lateral to the anterior superior iliac spine, and  2 cm distal. The incision was deepened down to the fascia of the tensor fascia lata muscle, where the fascia was incised the length of the wound. A Cobra was placed just lateral to the anterior  inferior iliac spine and just lateral to the capsule of the hip over the proximal neck. The tensor fascia muscle was then retracted  with the Hibbs, and the rectus femoris was retracted medially with another Hibbs. The ascending branches of the lateral femoral circumflex vessels were then clamped and electrocauterized. Using a cobb elevator, the soft tissue was elevated off the anterior  part of the femoral capsule, and a Cobra retractor was placed medially. An anterolateral capsulotomy was then performed, from the acetabulum to the intertrochanteric line. The lateral capsule was excised, and the anterior capsule was elevated off the  medial neck.  The leg was then slightly externally rotated with traction and the neck cut  was done about 1 fingerbreadth above the lesser trochanter. The corkscrew was inserted into the femoral head and it was removed easily rotating the head 180 degrees. A Cobra retractor was  placed behind the acetabulum. A skinny Hohmann retractor was placed on the anterior  part of the acetabulum rim, and then the labrum and any osteophytes around the acetabulum were resected. The pulvinar in the fovea was resected, and then the acetabulum  was reamed in 45 degrees of abduction and the patient's natural anteversion. The reamer was medialized to the base of the fovea and then widened to 1mm less than the actual cup to be implanted. There was good peripheral fit with good bleeding bone. An   appropriately sized trident Hemispherical Acetabular Cup was selected to be implanted. The acetabulum was Simpulse lavage irrigated  and then dried with a sponge stick. The cup was then seated fully with excellent purchase and good stability. The final abduction was 45  degrees and the anteversion was 15 degrees.  The anterior lip of the cup was just inside the natural acetabulum . Then appropriately sized acetabular liner was then Morse-tapered into position.    No screws were used in the acetabular cup.      Attention was now turned to the femur. The femoral hook was placed behind the femur. Traction was taken off the leg, and the hip was maximally externally rotated, adducted and extended. The proximal  femur was then brought up into the wound as maximally as possible. A Mueller retractor was placed on the medial neck, and a large Hohmann was placed around the greater trochanter. The capsule,  the obturator internus and the piriformis were then released from the inside of the greater trochanter, from anterior to posterior. The patient had excellent mobility of his femur.  The bone closest to the greater trochanter, at the  femoral neck, was  then removed with a rongeur. A box osteotome was then used to open the canal, then the lateralizer, the starter broach and finally by the zero broach. This was then broached up to the appropriately  size progressively, following the anteversion of the posterior neck of the femur. Once the broach was seated completely the calcar was planed to make the femoral  neck cut smooth and even.  There was excellent stability on torquing the femoral broach in the femoral canal. The patient was trialed with a neck and with different neck lengths. The femur was reduced in the acetabulum and the hip was checked for stability clinically and X-ray  was taken with fluoroscopy to check prosthesis position and the leg lengths. The appropriately sized femoral head gave excellent  anterior stability. The hip could be rotated externally 90 degrees at the knee and placing a bone hook behind the femoral neck it could not be dislocated anteriorly. The leg length was 0  mm compared to pre-incision measurement. These components were selected for implantation. All trial components were removed. The femur was Simpulse lavaged, and the appropriately sized accolade  2 femoral stem was impacted down the femur, with excellent purchase and rotational stability. The appropriately sized  femoral head was Morse tapered on top of the femoral component, reduced into the socket, and the patient had the exact same stability characteristics and leg lengths as noted previously. The wound was irrigated out.  Periarticular infiltration using Rabivacain mixture (1 10 ml saline solution containing 300 mg ropivacaine, 30 mg ketorolac, 10 mg morphine, and 0.25 ml epinephrine (1:1000) was  used for injecting the skin, the tensor muscle, the rectus femoris, the vastus lateralis and the medial capsule.The fascia of the  tensor muscle was closed with a running locking #1 Vicryl. Subcutaneous layer was closed with 2-0 Vicryls The skin was closed with stapler. The wound was dressed with a Mepilex dressing. The patient returned to the recovery room awake and in stable condition.  All instrument, sponge, and needle counts were correct. The patient had the appropriate leg lengths when they were on the hospital bed.       Madie Reno, MD   01/29/2015

## 2015-01-30 LAB — CBC WITH AUTOMATED DIFF
ABS. BASOPHILS: 0 10*3/uL (ref 0.0–0.06)
ABS. EOSINOPHILS: 0.1 10*3/uL (ref 0.0–0.4)
ABS. LYMPHOCYTES: 2.9 10*3/uL (ref 0.9–3.6)
ABS. MONOCYTES: 1.7 10*3/uL — ABNORMAL HIGH (ref 0.05–1.2)
ABS. NEUTROPHILS: 14.2 10*3/uL — ABNORMAL HIGH (ref 1.8–8.0)
BASOPHILS: 0 % (ref 0–2)
EOSINOPHILS: 0 % (ref 0–5)
HCT: 20.4 % — ABNORMAL LOW (ref 35.0–45.0)
HGB: 6.8 g/dL — ABNORMAL LOW (ref 12.0–16.0)
LYMPHOCYTES: 15 % — ABNORMAL LOW (ref 21–52)
MCH: 29.3 PG (ref 24.0–34.0)
MCHC: 33.3 g/dL (ref 31.0–37.0)
MCV: 87.9 FL (ref 74.0–97.0)
MONOCYTES: 9 % (ref 3–10)
MPV: 12.2 FL — ABNORMAL HIGH (ref 9.2–11.8)
NEUTROPHILS: 76 % — ABNORMAL HIGH (ref 40–73)
PLATELET: 257 10*3/uL (ref 135–420)
RBC: 2.32 M/uL — ABNORMAL LOW (ref 4.20–5.30)
RDW: 12.9 % (ref 11.6–14.5)
WBC: 18.9 10*3/uL — ABNORMAL HIGH (ref 4.6–13.2)

## 2015-01-30 LAB — METABOLIC PANEL, BASIC
Anion gap: 12 mmol/L (ref 3.0–18)
BUN/Creatinine ratio: 24 — ABNORMAL HIGH (ref 12–20)
BUN: 22 MG/DL — ABNORMAL HIGH (ref 7.0–18)
CO2: 23 mmol/L (ref 21–32)
Calcium: 8.7 MG/DL (ref 8.5–10.1)
Chloride: 100 mmol/L (ref 100–108)
Creatinine: 0.9 MG/DL (ref 0.6–1.3)
GFR est AA: >60 mL/min/{1.73_m2} (ref >60)
GFR est non-AA: >60 mL/min/{1.73_m2} (ref >60)
Glucose: 147 mg/dL — ABNORMAL HIGH (ref 74–99)
Potassium: 4.7 mmol/L (ref 3.5–5.5)
Sodium: 135 mmol/L — ABNORMAL LOW (ref 136–145)

## 2015-01-30 LAB — HGB & HCT
HCT: 29.2 % — ABNORMAL LOW (ref 35.0–45.0)
HGB: 10 g/dL — ABNORMAL LOW (ref 12.0–16.0)

## 2015-01-30 MED ORDER — SODIUM CHLORIDE 0.9 % IV
INTRAVENOUS | Status: DC | PRN
Start: 2015-01-30 — End: 2015-02-02

## 2015-01-30 MED FILL — FERROUS SULFATE 325 MG (65 MG ELEMENTAL IRON) TAB: 325 mg (65 mg iron) | ORAL | Qty: 1

## 2015-01-30 MED FILL — LORAZEPAM 1 MG TAB: 1 mg | ORAL | Qty: 1

## 2015-01-30 MED FILL — HYDROCODONE-ACETAMINOPHEN 7.5 MG-325 MG TAB: ORAL | Qty: 1

## 2015-01-30 MED FILL — HYDROMORPHONE (PF) 1 MG/ML IJ SOLN: 1 mg/mL | INTRAMUSCULAR | Qty: 1

## 2015-01-30 MED FILL — METOPROLOL TARTRATE 50 MG TAB: 50 mg | ORAL | Qty: 2

## 2015-01-30 MED FILL — TYLENOL 325 MG TABLET: 325 mg | ORAL | Qty: 2

## 2015-01-30 MED FILL — CEFAZOLIN 2 GM/50 ML IN DEXTROSE (ISO-OSMOTIC) IVPB: 2 gram/50 mL | INTRAVENOUS | Qty: 50

## 2015-01-30 MED FILL — SODIUM CHLORIDE 0.9 % IV: INTRAVENOUS | Qty: 250

## 2015-01-30 MED FILL — CLONIDINE 0.1 MG TAB: 0.1 mg | ORAL | Qty: 3

## 2015-01-30 MED FILL — AMLODIPINE 10 MG TAB: 10 mg | ORAL | Qty: 1

## 2015-01-30 MED FILL — LOVENOX 40 MG/0.4 ML SUBCUTANEOUS SYRINGE: 40 mg/0.4 mL | SUBCUTANEOUS | Qty: 0.4

## 2015-01-30 MED FILL — HYDRALAZINE 25 MG TAB: 25 mg | ORAL | Qty: 2

## 2015-01-30 MED FILL — CHLORTHALIDONE 25 MG TAB: 25 mg | ORAL | Qty: 1

## 2015-01-30 MED FILL — SEROQUEL XR 50 MG TABLET,EXTENDED RELEASE: 50 mg | ORAL | Qty: 3

## 2015-01-30 NOTE — ACP (Advance Care Planning) (Signed)
Patient has designated _her spouse_______________________ to participate in his/her discharge plan and to receive any needed information.     Name: [name] [name]  Address: [address] [place] [id number]  Phone number: [phone]

## 2015-01-30 NOTE — Progress Notes (Signed)
Medicine Progress Note    Patient: [name] [name]   Age:  64 y.o.  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 PRN pain control. Mobility and diet advancement per surgery team.    - HTN - Continue Norvasc/ Chlorthalidone/ Clonidine/ Hydralazine/ Lopressor.     - PTSD - Continue bedtime Seroquel    DISPO   -Pt unsafe for transfer or discharge at this time for reasons addressed above, continued hospitalization for R hip replacement ongoing assessment and treatment indicated      Anticipated Date of Discharge: per primary  Anticipated Disposition (home, SNF) : per primary    Subjective:   Patient seen and examined.   No complaints today    Objective:     Visit Vitals   ??? BP 123/89 (BP Patient Position: Head of bed elevated (Comment degrees))   ??? Pulse 98   ??? Temp 98.5 ??F (36.9 ??C)   ??? Resp 18   ??? Ht 5\' 7"  (1.702 m)   ??? Wt 80 kg (176 lb 5 oz)   ??? SpO2 93%   ??? BMI 27.61 kg/m2       Physical Exam:  General appearance: alert, cooperative, no distress, appears stated age  Head: Normocephalic, without obvious abnormality, atraumatic  Neck: supple, trachea midline  Lungs: clear to auscultation bilaterally  Heart: regular rate and rhythm, S1, S2 normal, no murmur, click, rub or gallop  Abdomen: soft, non-tender. Bowel sounds normal. No masses,  no organomegaly  Extremities: extremities normal, atraumatic, no cyanosis or edema  Skin: Skin color, texture, turgor normal. No rashes or lesions  Neurologic: Grossly normal  PSY: Mood and affect normal, appropriately behaved    Intake and Output:  Current Shift:  08/19 0701 - 08/19 1900  In: 360 [P.O.:360]  Out: 350    Last three shifts:  08/17 1901 - 08/19 0700  In: 2830 [P.O.:30; I.V.:2400]  Out: 1110 [Urine:710]    Lab/Data Reviewed:  CMP:   Lab Results   Component Value Date/Time    NA 135 (L) 01/30/2015 03:30 AM    K 4.7 01/30/2015 03:30 AM    CL 100 01/30/2015 03:30 AM    CO2 23 01/30/2015 03:30 AM    AGAP 12 01/30/2015 03:30 AM    GLU 147 (H) 01/30/2015 03:30 AM    BUN 22 (H) 01/30/2015 03:30 AM    CREA 0.90 01/30/2015 03:30 AM    GFRAA >60 01/30/2015 03:30 AM    GFRNA >60 01/30/2015 03:30 AM    CA 8.7 01/30/2015 03:30 AM     CBC:   Lab Results   Component Value Date/Time    WBC 18.9 (H) 01/30/2015 03:30 AM    HGB 10.0 (L) 01/30/2015 09:45 AM    HCT 29.2 (L) 01/30/2015 09:45 AM    PLT 257 01/30/2015 03:30 AM       Medications Reviewed:  Current Facility-Administered Medications   Medication Dose Route Frequency   ??? 0.9% sodium chloride infusion 250 mL  250 mL IntraVENous PRN   ??? amLODIPine (NORVASC)  tablet 10 mg  10 mg Oral DAILY   ??? chlorthalidone (HYGROTEN) tablet 25 mg  25 mg Oral DAILY   ??? cloNIDine HCl (CATAPRES) tablet 0.3 mg  0.3 mg Oral BID   ??? metoprolol tartrate (LOPRESSOR) tablet 100 mg  100 mg Oral BID   ??? hydrALAZINE (APRESOLINE) tablet 50 mg  50 mg Oral DAILY   ??? QUEtiapine SR (SEROquel XR) tablet 150 mg  150 mg Oral QHS   ??? 0.9% sodium chloride infusion  100 mL/hr IntraVENous CONTINUOUS   ??? sodium chloride (NS) flush 5-10 mL  5-10 mL IntraVENous Q8H   ??? sodium chloride (NS) flush 5-10 mL  5-10 mL IntraVENous PRN   ??? acetaminophen (TYLENOL) tablet 650 mg  650 mg Oral Q4H PRN   ??? HYDROcodone-acetaminophen (NORCO) 7.5-325 mg per tablet 1 Tab  1 Tab Oral Q4H PRN   ??? ferrous sulfate tablet 325 mg  1 Tab Oral TID WITH MEALS   ??? ondansetron (ZOFRAN ODT) tablet 4 mg  4 mg Oral Q4H PRN   ??? diphenhydrAMINE (BENADRYL) injection 12.5 mg  12.5 mg IntraVENous Q4H PRN   ??? diphenhydrAMINE (BENADRYL) capsule 25 mg  25 mg Oral Q4H PRN   ??? bisacodyl (DULCOLAX) tablet 5 mg  5 mg Oral DAILY PRN    ??? LORazepam (ATIVAN) tablet 1 mg  1 mg Oral QHS   ??? enoxaparin (LOVENOX) injection 40 mg  40 mg SubCUTAneous DAILY   ??? hydrALAZINE (APRESOLINE) 20 mg/mL injection 10 mg  10 mg IntraVENous Q6H PRN   ??? HYDROmorphone (PF) (DILAUDID) injection 1 mg  1 mg IntraVENous Q3H PRN       Gevena BarreAaron M Shaquna Geigle, MD    January 30, 2015

## 2015-01-30 NOTE — Progress Notes (Signed)
[facility]   Discharge Planning/Social Services Assessment    Reasons for Intervention: Chart reviewed.  Met with pt., verified all demographics.  States has [place] ins. States Dr. Gaetano Net is her PCP.  NOK: Naoma Diener, spouse, with whom she lives with & designates can participate in her discharge process.  States has needed DME.  Asked her about SNF for rehab, states does want to go to rehab.  States has been to SNF in the past but unable to tell me where.  States ok to send out to [facility].  Posted in e-discharge.  PLAN: SNF when medically stable, bed available & [facility] authorization obtained.  Will cont to follow for further needs.  Pat [place]. [phone].      High Risk Criteria   Yes  No   Physician Referral   Yes  No        Date    Nursing Referral   Yes  No        Date    Patient/Family Request   Yes  No        Date       Resources:    Medicare   Yes  No   Medicaid   Yes  No   No Resources   Yes  No   Private Insurance   Yes  No   Case Manager Name/Phone Number    Other   Yes  No        (i.e. Workman's Comp)         Prior Services:    Prior Services   Yes  No   Home Health   Yes  No   [place]   Yes  No        Number of Hours    Home Care Program   Yes  No   Case Manager    Meals on Wheels   Yes  No   Office on Aging   Yes  No   Transportation Services   Yes  No   Nursing Home   Yes  No        Nursing Home Name    [facility]   Yes  No        [place] Name    Other       Information Source:      Information obtained from   Patient   Parent    Guardian   Child   Spouse    Significant Other/Partner    Friend       EMS     Nursing Home Chart           Other:   Chart Review   Yes  No     Family/Support System:    Patient lives with   Alone     Spouse    Significant Other   Children   Caretaker    Parent   Sibling      Other       Other Support System:    Is the patient responsible for care of others   Yes  No   Information of person caring for patient on   discharge    Managers financial affairs independently   Yes  No   If no, explain:      Status Prior to Admission:    Mental Status   Awake   Alert   Oriented   Quiet/Calm  Lethargic/Sedated  Disoriented   Restless/Anxious   Combative   Personal Care   Dependent   Independent Personal Care   Requires Assistance   Meal Preparation Ability   Independent    Standby Assistance    Minimal Assistance    Moderate Assistance   Maximum Assistance      Total Assistance   Chores   Independent with [place] Resident    Requires Assistance   Bowel/Bladder   Continent   Catheter   Incontinent   Ostomy Self-Care     Urine Diversion Self-Care   Maximum Assistance      Total Assistance   Number of Persons needed for assistance    DME at home   [place], Javier Docker   [place], Straight    Commode     Bathroom/Grab [facility] Bed   Nebulizer   Oxygen            Raised Toilet Seat   Shower Chair   Side Rails for Bed    Tub Transfer Bench    [place], Building surveyor, Standard       Other:   Vendor      Treatment Presently Receiving:    Current Treatments   Chemotherapy   Dialysis   Insulin   IVAB  IVF    O2   PCA    PT    RT    Tube Feedings    Wound Care     Psychosocial Evaluation:    Verbalized Knowledge of Disease Process   Patient  Family   Coping with Disease Process   Patient  Family   Requires Further Counseling Coping with Disease Process   Patient  Family     Identified Projected Needs:    Home Health Aid   Yes  No   Transportation   Yes  No   Education   Yes  No        Specific Education     Financial Counseling   Yes  No   Inability to Care for Self/Will Require 24 hour care   Yes  No   Pain Management   Yes  No   Home Infusion Therapy   Yes  No   Oxygen Therapy   Yes  No   DME   Yes  No   Long Term Care Placement   Yes  No   Rehab   Yes  No   Physical Therapy   Yes  No   Needs Anticipated At This Time   Yes  No     Intra-Hospital Referral:    [place]   Yes  No   Life Coach   Yes  No    Patient Representative   Yes  No   Staff for Teaching Needs   Yes  No   Specialty Teaching Needs     Diabetic Educator   Yes  No   Referral for Diabetic Educator Needed   Yes  No  If Yes, place order for Nutritionist or Diabetic Consult     Tentative Discharge Plan:    Home with No Services   Yes  No   Home with Home Health Follow-up   Yes  No        If Yes, specify type    Home Care Program   Yes  No        If Yes, specify type    Meals on Wheels  Yes  No   Office of Aging   Yes  No   NHP   Yes  No   Return to the Nursing Home   Yes  No   Rehab Therapy   Yes  No   Acute Rehab   Yes  No   Subacute Rehab   Yes  No   Private Care   Yes  No   Substance Abuse Referral   Yes  No   Transportation   Yes  No   [place] Service   Yes  No   Inpatient Hospice   Yes  No   OP RT   Yes   No   OP Hemo   Yes   No   OP PT   Yes  No   Support Group   Yes  No   Reach to Recovery   Yes  No   OP Oncology Clinic   Yes  No   Clinic Appointment   Yes  No   DME   Yes  No   Comments    Name of D/C Planner or Social Worker Given to Patient or Family Libby Maw   Phone Number Pager: [phone]        Extension Ext. [phone]. [phone]   Date 01-30-2015   Time    If you are discharged home, whom do you designate to participate in your discharge plan and receive any information needed?     Enter name of designee Naoma Diener        Phone # of designee [phone]        Address of designee [address] [place]. [place] [address]        Updated         Patient refused to designate any           individual

## 2015-01-30 NOTE — Progress Notes (Signed)
Problem: Mobility Impaired (Adult and Pediatric)  Goal: *Acute Goals and Plan of Care (Insert Text)  Physical Therapy Goals  Initiated 01/30/2015 and to be accomplished within 7 day(s)  1. Patient will move from supine to sit and sit to supine , scoot up and down and roll side to side in bed with modified independence.   2. Patient will transfer from bed to chair and chair to bed with modified independence using the least restrictive device.  3. Patient will perform sit to stand with modified independence.  4. Patient will ambulate with modified independence for 150 feet with the least restrictive device.   5. Patient will be independent verbalizing anterior hip precautions.  6. Patient will be independent with home exercise program.   PHYSICAL THERAPY EVALUATION     Patient: [name] [name] (64 y.o. female)  Date: 01/30/2015  Primary Diagnosis: osteroarthritis  Osteoarthritis of hips, bilateral  Procedure(s) (LRB):  right total hip replacement - anterior approach (Right) 1 Day Post-Op   Precautions:   WBAT (anterior hip)      PROBLEM LIST:  Patient presents with the following problems:   Bed Mobility, Transfers, Gait, Strength, Range of Motion, Balance, Home Exercise Program and Precautions  ASSESSMENT :   Patient requires between minimal assistance/contact guard assist and supervision/set-up for bed mobility, transfers and ambulation.  Patient needing assistance to advance RLE during ambulation, education provided regarding anterior hip precautions and DVT prevention.      Patient will benefit from skilled intervention to address the above impairments.  Patient???s rehabilitation potential is considered to be Excellent  Factors which may influence rehabilitation potential include:   [ ]          None noted  [ ]          Mental ability/status  [X]          Medical condition  [ ]          Home/family situation and support systems  [ ]          Safety awareness  [ ]          Pain tolerance/management  [ ]          Other:         PLAN :  Recommendations and Planned Interventions:  [X]            Bed Mobility Training             [X]     Neuromuscular Re-Education  [X]            Transfer Training                   [ ]     Orthotic/Prosthetic Training  [X]            Gait Training                          [ ]     Modalities  [X]            Therapeutic Exercises          [ ]     Edema Management/Control  [X]            Therapeutic Activities            [X]     Patient and Family Training/Education  [ ]            Other (comment):     Frequency/Duration: Patient will be followed by physical therapy 1-2 times per day/4-7 days  per week to address goals.  Discharge Recommendations: Home Health and Skilled Nursing Facility  Further Equipment Recommendations for Discharge: rolling walker       SUBJECTIVE:   Patient stated ???i need a pain shot.???      OBJECTIVE DATA SUMMARY:       Past Medical History   Diagnosis Date   ??? Arthritis     ??? Chronic pain     ??? GERD (gastroesophageal reflux disease)     ??? Hypertension     ??? Liver disease         HEP C   ??? PTSD (post-traumatic stress disorder)         lived through [place]     Past Surgical History   Procedure Laterality Date   ??? Hx gyn   2010       hysterectomy   ??? Hx endoscopy         Barriers to Learning/Limitations: None  Compensate with: visual, verbal, tactile, kinesthetic cues/model  GCODES(GP):Mobility [id number] Current  CJ= 20-39%  G8979 Goal  CH= 0%.  The severity rating is based on the Other functional assessment  Prior Level of Function/Home Situation: mod I  Home Situation  Home Environment: Private residence  # Steps to Enter: 0  One/Two Story Residence: Two story (chair lift to 2nd floor)  Lift Chair Available: Yes  Living Alone: No  Support Systems: Spouse/Significant Other/Partner  Patient Expects to be Discharged to:: Rehabilitation facility  Current DME Used/Available at Home: Wheelchair, Environmental consultant, rolling, [place], quad  Critical Behavior:   Neurologic State: Drowsy;Eyes open to voice;Eyes open spontaneously  Orientation Level: Oriented X4  Cognition: Follows commands  Safety/Judgement: Decreased awareness of need for assistance  Psychosocial  Patient Behaviors: Cooperative;Other (comment) (humor)  Purposeful Interaction: Yes  Caritas Process: Nurture loving kindness;Establish trust;Teaching/learning;Attend basic human needs;Create healing environment;Supportive expression  Caring Interventions: Therapeutic modalities;Reassure  Strength:    Strength: Generally decreased, functional  Manual Muscle Testing (LE)                                                     R                     L                     Hip Flexion:                           2/5                   5/5    Knee EXT:                            4/5                   5/5  Knee FLEX:                            4/5                   5/5      Ankle DF:  5/5                   5/5  _________________________________________________   Tone & Sensation:   Tone: Normal  Sensation: Intact     Range Of Motion:  AROM: Within functional limits (BUE)  PROM: Within functional limits (BUE)  Functional Mobility:  Bed Mobility:  Rolling: Minimum assistance  Supine to Sit: Minimum assistance  Sit to Supine: Minimum assistance     Transfers:  Sit to Stand: Minimum assistance  Stand to Sit: Minimum assistance  Stand Pivot Transfers: Minimum assistance     Bed to Chair: Minimum assistance  Balance:   Sitting: Intact  Standing: Intact;With support  Ambulation/Gait Training:  Distance (ft): 25 Feet (ft)  Assistive Device: Walker, rolling  Ambulation - Level of Assistance: Minimal assistance (assist to advance RLE)  Gait Description (WDL): Exceptions to WDL  Gait Abnormalities: Decreased step clearance (ext R hip rotation despite cuing)  Step Length: Right shortened  Therapeutic Exercises:   Ankle pumps sitting in chair x10   Pain:  Pre treatment pain level:7  Post treatment pain level:7   Pain Scale 1: Numeric (0 - 10)  Pain Intensity 1: 7  Pain Intervention(s) 1: Medication (see MAR);Cold pack;Position;Repositioned;Rest (up in chair)  Activity Tolerance:   fair  Please refer to the flowsheet for vital signs taken during this treatment.  After treatment:   [X]          Patient left in no apparent distress sitting up in chair  [ ]          Patient left in no apparent distress in bed  [X]          Call bell left within reach  [X]          Nursing notified  [ ]          Caregiver present  [ ]          Bed alarm activated      COMMUNICATION/EDUCATION:   [X]          Fall prevention education was provided and the patient/caregiver indicated understanding.  [X]          Patient/family have participated as able in goal setting and plan of care.  [ ]          Patient/family agree to work toward stated goals and plan of care.  [ ]          Patient understands intent and goals of therapy, but is neutral about his/her participation.  [ ]          Patient is unable to participate in goal setting and plan of care.     Patient educated on the role of PT and the importance of mobility.       Thank you for this referral.  Myrtice LauthAlan S Zedaker, PT   Time Calculation: 25 mins

## 2015-01-30 NOTE — Progress Notes (Signed)
Problem: Self Care Deficits Care Plan (Adult)  Goal: *Acute Goals and Plan of Care (Insert Text)  Occupational Therapy Goals  Initiated 01/30/2015 within 7 day(s).    1. Patient will perform self-feeding and grooming with modified independence  2. Patient will perform bathing with modified independence using adaptive equipment/ modified technique.  3. Patient will perform upper body dressing with modified independence.  4. Patient will perform lower body dressing with modified independence using adaptive equipment/ modified technique.  5. Patient will perform toilet transfers with modified independence using adaptive equipment/ modified technique.  6. Patient will perform all aspects of toileting with modified independence.  7. Patient will participate in upper extremity therapeutic exercise/activities with modified independence for 5 minutes.   8. Patient will utilize energy conservation techniques during functional activities with verbal cues.   Outcome: Progressing Towards Goal  OCCUPATIONAL THERAPY TREATMENT     Patient: [name] [name] (64 y.o. female)  Date: 01/30/2015  Diagnosis: osteroarthritis  Osteoarthritis of hips, bilateral Osteoarthritis of hips, bilateral    Procedure(s) (LRB):  right total hip replacement - anterior approach (Right) 1 Day Post-Op  Precautions: WBAT (anterior hip)  Chart, occupational therapy assessment, plan of care, and goals were reviewed.      ASSESSMENT:  Pt is pleasant and cooperative, with supportive spouse at bedside. Pt motivated to participate w/therapy. Pt requires Min Assist, RLE,  w/bed mobility to EOB. Pt requires vc's for hand placement and Min Assist w/sit to stand transfer. Pt decrease standing balance requires CGA and increase time w/functional mobility to bathroom. Pt tolerates ~ 5 minutes standing sinkside performing ADL grooming tasks. Pt requires seated rest break and proceeds to chair w/RW requiring CGA and min vc's for safety w/RW and hand  placement. Pt left in chair and reinforced importance of calling for assistance.   EDUCATION Pt educated on importance OOB and encourage OOB for all meals.  Progression toward goals:  [X]           Improving appropriately and progressing toward goals  [ ]           Improving slowly and progressing toward goals  [ ]           Not making progress toward goals and plan of care will be adjusted       PLAN:  Patient continues to benefit from skilled intervention to address the above impairments. Continue treatment per established plan of care.  Discharge Recommendations:  Inpatient Rehab vs Skilled Nursing Facility  Further Equipment Recommendations for Discharge:   TBD by SNF staff       SUBJECTIVE:   Patient stated ???Thank you for being so nice.???      OBJECTIVE DATA SUMMARY:         Cognitive/Behavioral Status:  Neurologic State: Alert  Orientation Level: Oriented X4  Cognition: Appropriate for age attention/concentration, Follows commands  Safety/Judgement: Decreased awareness of need for assistance  Functional Mobility and Transfers for ADLs:              Bed Mobility:  Rolling: Minimum assistance  Supine to Sit: Minimum assistance (requires assist w/RLE)  Sit to Supine: Minimum assistance              Transfers:  Sit to Stand: Minimum assistance  Bed to Chair: Contact guard assistance              Bathroom Mobility: Contact guard assistance              Balance:  Sitting: Intact  Standing: With support  Standing - Static: Fair (fair/fair plus)  Standing - Dynamic : Fair  ADL Intervention:  Grooming  Washing Hands: Contact guard assistance  Brushing Teeth: Contact guard assistance     Cognitive Retraining  Safety/Judgement: Decreased awareness of need for assistance     Pain:  Pre Treatment:7  Post Treatment:7  Pain Scale 1: Numeric (0 - 10)  Pain Intensity 1: 7  Pain Location 1: Hip (R > L)  Pain Intervention(s) :NSG notified, Amber     Activity Tolerance:    Good      Please refer to the flowsheet for vital signs taken during this treatment.  After treatment:     Patient left in no apparent distress sitting up in chair    Patient left in no apparent distress in bed    Call bell left within reach    Nursing notified    Spouse present    Bed alarm activated     Leann  Dextradeur, COTA  Time Calculation: 23 mins

## 2015-01-30 NOTE — Other (Addendum)
1910 Pt received after report given by Gildardo Griffes RN  2300 Pt resting in bed c/o pain in hip .  Medicated as per order  0300 Pt resting in bed  0610 Dr Marquis Lunch. Alfahd paged due to low H/H 6.8/20.4  Answering service left message  0700 Dr Donato Schultz returned call.  One unit of blood ordered.  Bedside and Verbal shift change report given to A Lorelle Gibbs (oncoming nurse) by Geanie Cooley, RN (offgoing nurse). Report included the following information SBAR, Kardex, Intake/Output, MAR and Recent Results.

## 2015-01-30 NOTE — Progress Notes (Signed)
Repeat hand h 10.0, 29.2. Notified Dr. Osvaldo Human of results blood transfusion cancelled, lab notified

## 2015-01-30 NOTE — Progress Notes (Addendum)
Progress Note      Patient: [name] [name]               Sex: female          DOA: 01/29/2015     Date of Birth:  February 05, 1951      Age:  64 y.o.        LOS:  LOS: 1 day     Status Post: Procedure(s):  right total hip replacement - anterior approach  Surgery Date: 01/29/2015            Subjective:     [name] is a 64 y.o. female without c/o moderate pain, her HB 6.8 but she clinically asymptomatic, she got up with PT walk inside the room, she sat on chair, eating her break fast.  Patient denies any complaint of calf pain/ SOB or difficulty breathing.    Objective:      Visit Vitals   ??? BP (!) 142/95 (BP 1 Location: Right arm, BP Patient Position: At rest)   ??? Pulse 84   ??? Temp 98.9 ??F (37.2 ??C)   ??? Resp 18   ??? Ht 5\' 7"  (1.702 m)   ??? Wt 176 lb 5 oz (80 kg)   ??? SpO2 90%   ??? BMI 27.61 kg/m2       Physical Exam:   Dressing:  clean, dry, intact  Wiggles Toes/Ankle  Foot sensation intact to light touch  No foot edema/ +1 Posterior Tibial Pulse  Leg Lengths appear equal    Xray - good    Intake and Output:  Current Shift:     Last three shifts:  08/17 1901 - 08/19 0700  In: 2830 [P.O.:30; I.V.:2400]  Out: 1110 [Urine:710]  Voiding Status:  + void without need for Foley catheter    Lab/Data Reviewed:  Recent Labs      01/30/15   0330   HGB  6.8*   HCT  20.4*   NA  135*   K  4.7   BUN  22*   CREA  0.90   GLU  147*         Medications Reviewed    Assessment/Plan     Principal Problem:    Osteoarthritis of hips, bilateral (01/29/2015)    Active Problems:    Essential hypertension (07/21/2014)      PTSD (post-traumatic stress disorder) (01/29/2015)        ?? Discontinue Oxygen.  ?? Change IV to Saline Lock.  ?? Discharge Planning to rehab by Monday.  ?? start Lovenox today   ?? Transfuse 1 unit of Blood.  ?? repeat her CBC tomorrow AM   ?? Start PT hip protocol

## 2015-01-30 NOTE — Progress Notes (Signed)
Bedside shift change report given to Sao Tome and PrincipeVeronica, Charity fundraiserN (Cabin crewoncoming nurse) by Aquilla HackerAmber Burgess, RN (offgoing nurse). Report included the following information SBAR, OR Summary, Procedure Summary, Intake/Output, MAR and Recent Results.

## 2015-01-30 NOTE — Progress Notes (Signed)
Problem: Self Care Deficits Care Plan (Adult)  Goal: *Acute Goals and Plan of Care (Insert Text)  Occupational Therapy Goals  Initiated 01/30/2015 within 7 day(s).    1. Patient will perform self-feeding and grooming with modified independence  2. Patient will perform bathing with modified independence using adaptive equipment/ modified technique.  3. Patient will perform upper body dressing with modified independence.  4. Patient will perform lower body dressing with modified independence using adaptive equipment/ modified technique.  5. Patient will perform toilet transfers with modified independence using adaptive equipment/ modified technique.  6. Patient will perform all aspects of toileting with modified independence.  7. Patient will participate in upper extremity therapeutic exercise/activities with modified independence for 5 minutes.   8. Patient will utilize energy conservation techniques during functional activities with verbal cues.   Outcome: Progressing Towards Goal  OCCUPATIONAL THERAPY EVALUATION     Patient: [name] [name] (64 y.o. female)  Date: 01/30/2015  Primary Diagnosis: osteroarthritis  Osteoarthritis of hips, bilateral  Procedure(s) (LRB):  right total hip replacement - anterior approach (Right) 1 Day Post-Op   Precautions: Hip, Standard, Falls         ASSESSMENT :  Based on the objective data described below, the patient presents with generally decreased strength, coordination, cognition, functional activity tolerance and self care skills. Pt was given education on safety awareness and the role of OT in acute care. Pt was confused during evaluation and treatment today. Nursing was notified. Pt had 5/10 pain in R hip but was able to stand with RW and perform grooming, feeding and dressing. Pt was given adaptive equipment and reacher. Commode chair was previously installed over toilet for ease/safe toilet transfer.           Patient will benefit from skilled intervention to address the above  impairments.  Patient???s rehabilitation potential is considered to be Good  Factors which may influence rehabilitation potential include:   [ ]              None noted  [X]              Mental ability/status  [ ]              Medical condition  [ ]              Home/family situation and support systems  [ ]              Safety awareness  [ ]              Pain tolerance/management  [ ]              Other:        PLAN :  Recommendations and Planned Interventions:  [X]                Self Care Training                  [X]         Therapeutic Activities  [X]                Functional Mobility Training    [ ]         Cognitive Retraining  [ ]                Therapeutic Exercises           [X]         Endurance Activities  [X]                Balance  Training                   [X]         Neuromuscular Re-Education  [X]                Visual/Perceptual Training     [X]    Home Safety Training  [X]                Patient Education                 [X]         Family Training/Education  [ ]                Other (comment):     Frequency/Duration: Patient will be followed by occupational therapy 3 times a week to address goals.  Discharge Recommendations: Rehab and Home Health  Further Equipment Recommendations for Discharge: N/A       SUBJECTIVE:   Patient stated ???Hey Taraji P.???      OBJECTIVE DATA SUMMARY:       Past Medical History   Diagnosis Date   ??? Arthritis     ??? Chronic pain     ??? GERD (gastroesophageal reflux disease)     ??? Hypertension     ??? Liver disease         HEP C   ??? PTSD (post-traumatic stress disorder)         lived through the [place]     Past Surgical History   Procedure Laterality Date   ??? Hx gyn   2010       hysterectomy   ??? Hx endoscopy         Barriers to Learning/Limitations:   Compensate with: visual, verbal, tactile, kinesthetic cues/model     Prior Level of Function/Home Situation:   Home Situation  Home Environment: Private residence  One/Two Story Residence: Two story  Living Alone: No   Support Systems: Games developer  Patient Expects to be Discharged to:: Rehabilitation facility  Current DME Used/Available at Home: Wheelchair, Environmental consultant, rolling, [place], quad     Cognitive/Behavioral Status:  Neurologic State: Alert;Confused  Orientation Level: Disoriented to place;Disoriented to time;Oriented to person;Oriented to situation  Cognition: Decreased attention/concentration;Impulsive  Safety/Judgement: Decreased awareness of need for assistance     Vision/Perceptual:    Tracking: Able to track stimulus in all quadrants w/o difficulty       Diplopia: Yes    Acuity: Within Defined Limits    Corrective Lenses: Glasses  Coordination:  Coordination: Generally decreased, functional  Fine Motor Skills-Upper: Left Intact;Right Intact    Gross Motor Skills-Upper: Left Intact;Right Intact  Balance:  Sitting: Intact  Standing: Intact;With support  Strength:     Strength: Generally decreased, functional      Tone & Sensation:     Tone: Normal  Sensation: Intact  Range of Motion:     AROM: Within functional limits (BUE)  PROM: Within functional limits (BUE)  Functional Mobility and Transfers for ADLs:  Transfers:  Sit to Stand: Minimum assistance  ADL Assessment:  BARTHEL  Bathing: 0  Bladder: 10  Bowels: 10  Dressing: 5  Feeding: 10  Grooming: 5  Mobility: 5  Stairs: 0  Toilet Use: 5  Transfer (Bed to Chair and Back): 5  Toilet Use: 5  Transfer (Bed to Chair and Back): 5  Total: 55         Feeding: Setup     Oral Facial Hygiene/Grooming:  Setup     Bathing: Moderate assistance     Upper Body Dressing: Setup     Lower Body Dressing: Maximum assistance     Toileting: Stand by assistance     ADL Intervention:  Grooming: s/u  Feeding: s/u  Functional ADL Transfers: min A with RW  Cognitive Retraining  Safety/Judgement: Decreased awareness of need for assistance     Pain:  Pre Pain: 5/10 R hip  Post Pain: 5/10 R hip     Please refer to the flowsheet for vital signs taken during this treatment.   After treatment:   [X]  Patient left in no apparent distress sitting up in chair  [ ]  Patient left in no apparent distress in bed  [X]  Call bell left within reach  [X]  Nursing notified  [ ]  Caregiver present  [ ]  Bed alarm activated      COMMUNICATION/EDUCATION:   [X]  Home safety education was provided and the patient/caregiver indicated understanding.  [X]  Patient/family have participated as able in goal setting and plan of care.  [X]  Patient/family agree to work toward stated goals and plan of care.  [ ]  Patient understands intent and goals of therapy, but is neutral about his/her participation.  [ ]  Patient is unable to participate in goal setting and plan of care.     Thank you for this referral.  Theresa Mulligan MS, OTR/L  Time Calculation: 27 mins

## 2015-01-30 NOTE — Progress Notes (Signed)
Pt had and incontinence occurrence. Bed linen changed and peri care done independently. Ambulated to restroom and urinated 200 ml of urine.  Ambulated back to bed with walker with some SOB. 2L N/C applied for O2 level of 93% C/o R hip pain.See MAR. Sitting up in bed to eat. Pillow place in between legs to provide abduction. Ice pack applied to R hip for comfort. Good appetite for lunch.

## 2015-01-30 NOTE — Progress Notes (Signed)
Problem: Mobility Impaired (Adult and Pediatric)  Goal: *Acute Goals and Plan of Care (Insert Text)  Physical Therapy Goals  Initiated 01/30/2015 and to be accomplished within 7 day(s)  1. Patient will move from supine to sit and sit to supine , scoot up and down and roll side to side in bed with modified independence.   2. Patient will transfer from bed to chair and chair to bed with modified independence using the least restrictive device.  3. Patient will perform sit to stand with modified independence.  4. Patient will ambulate with modified independence for 150 feet with the least restrictive device.   5. Patient will be independent verbalizing anterior hip precautions.  6. Patient will be independent with home exercise program.      Pt refusing PT X 2 this afternoon stating she is tired and in pain form previous PT / OT sessions. Pt rates pain 7/10 on each attempt.

## 2015-01-31 LAB — TYPE AND SCREEN
ABO/Rh: B POS
Antibody Screen: NEGATIVE
Unit Divison: 0

## 2015-01-31 LAB — URINALYSIS W/ RFLX MICROSCOPIC
Bilirubin: NEGATIVE
Glucose: 100 mg/dL — AB
Nitrites: NEGATIVE
Specific gravity: 1.017 (ref 1.005–1.030)
Urobilinogen: 0.2 EU/dL (ref 0.2–1.0)
pH (UA): 5 (ref 5.0–8.0)

## 2015-01-31 LAB — METABOLIC PANEL, BASIC
Anion gap: 11 mmol/L (ref 3.0–18)
BUN/Creatinine ratio: 21 — ABNORMAL HIGH (ref 12–20)
BUN: 12 MG/DL (ref 7.0–18)
CO2: 25 mmol/L (ref 21–32)
Calcium: 8.1 MG/DL — ABNORMAL LOW (ref 8.5–10.1)
Chloride: 102 mmol/L (ref 100–108)
Creatinine: 0.56 MG/DL — ABNORMAL LOW (ref 0.6–1.3)
GFR est AA: >60 mL/min/{1.73_m2} (ref >60)
GFR est non-AA: >60 mL/min/{1.73_m2} (ref >60)
Glucose: 148 mg/dL — ABNORMAL HIGH (ref 74–99)
Potassium: 3.6 mmol/L (ref 3.5–5.5)
Sodium: 138 mmol/L (ref 136–145)

## 2015-01-31 LAB — TYPE & SCREEN
ABO/Rh(D): B POS
Antibody screen: NEGATIVE
Unit division: 0

## 2015-01-31 LAB — CBC WITH AUTOMATED DIFF
ABS. BASOPHILS: 0 10*3/uL (ref 0.0–0.06)
ABS. EOSINOPHILS: 0.1 10*3/uL (ref 0.0–0.4)
ABS. LYMPHOCYTES: 2 10*3/uL (ref 0.9–3.6)
ABS. MONOCYTES: 1.1 10*3/uL (ref 0.05–1.2)
ABS. NEUTROPHILS: 5.9 10*3/uL (ref 1.8–8.0)
BASOPHILS: 0 % (ref 0–2)
EOSINOPHILS: 1 % (ref 0–5)
HCT: 26.9 % — ABNORMAL LOW (ref 35.0–45.0)
HGB: 9.1 g/dL — ABNORMAL LOW (ref 12.0–16.0)
LYMPHOCYTES: 22 % (ref 21–52)
MCH: 30 PG (ref 24.0–34.0)
MCHC: 33.8 g/dL (ref 31.0–37.0)
MCV: 88.8 FL (ref 74.0–97.0)
MONOCYTES: 12 % — ABNORMAL HIGH (ref 3–10)
MPV: 11.9 FL — ABNORMAL HIGH (ref 9.2–11.8)
NEUTROPHILS: 65 % (ref 40–73)
PLATELET: 126 10*3/uL — ABNORMAL LOW (ref 135–420)
RBC: 3.03 M/uL — ABNORMAL LOW (ref 4.20–5.30)
RDW: 12.7 % (ref 11.6–14.5)
WBC: 9.1 10*3/uL (ref 4.6–13.2)

## 2015-01-31 LAB — URINE MICROSCOPIC ONLY
RBC: 21 /hpf (ref 0–5)
WBC: 0 /hpf (ref 0–4)

## 2015-01-31 MED FILL — FERROUS SULFATE 325 MG (65 MG ELEMENTAL IRON) TAB: 325 mg (65 mg iron) | ORAL | Qty: 1

## 2015-01-31 MED FILL — HYDROMORPHONE (PF) 1 MG/ML IJ SOLN: 1 mg/mL | INTRAMUSCULAR | Qty: 1

## 2015-01-31 MED FILL — DIPRIVAN 10 MG/ML INTRAVENOUS EMULSION: 10 mg/mL | INTRAVENOUS | Qty: 160

## 2015-01-31 MED FILL — CLONIDINE 0.1 MG TAB: 0.1 mg | ORAL | Qty: 3

## 2015-01-31 MED FILL — HYDRALAZINE 25 MG TAB: 25 mg | ORAL | Qty: 2

## 2015-01-31 MED FILL — METOPROLOL TARTRATE 50 MG TAB: 50 mg | ORAL | Qty: 2

## 2015-01-31 MED FILL — VASOSTRICT 20 UNIT/ML INTRAVENOUS SOLUTION: 20 unit/mL | INTRAVENOUS | Qty: 3

## 2015-01-31 MED FILL — LIDOCAINE (PF) 20 MG/ML (2 %) IJ SOLN: 20 mg/mL (2 %) | INTRAMUSCULAR | Qty: 60

## 2015-01-31 MED FILL — HYDROCODONE-ACETAMINOPHEN 7.5 MG-325 MG TAB: ORAL | Qty: 1

## 2015-01-31 MED FILL — EPHEDRINE SULFATE 50 MG/ML IJ SOLN: 50 mg/mL | INTRAMUSCULAR | Qty: 30

## 2015-01-31 MED FILL — CHLORTHALIDONE 25 MG TAB: 25 mg | ORAL | Qty: 1

## 2015-01-31 MED FILL — LORAZEPAM 1 MG TAB: 1 mg | ORAL | Qty: 1

## 2015-01-31 MED FILL — AMLODIPINE 10 MG TAB: 10 mg | ORAL | Qty: 1

## 2015-01-31 MED FILL — GLYCOPYRROLATE 0.2 MG/ML IJ SOLN: 0.2 mg/mL | INTRAMUSCULAR | Qty: 0.4

## 2015-01-31 MED FILL — ONDANSETRON (PF) 4 MG/2 ML INJECTION: 4 mg/2 mL | INTRAMUSCULAR | Qty: 4

## 2015-01-31 MED FILL — NEOSTIGMINE METHYLSULFATE 3 MG/3 ML (1 MG/ML) IV SYRINGE: 3 mg/ mL (1 mg/mL) | INTRAVENOUS | Qty: 3

## 2015-01-31 MED FILL — LOVENOX 40 MG/0.4 ML SUBCUTANEOUS SYRINGE: 40 mg/0.4 mL | SUBCUTANEOUS | Qty: 0.4

## 2015-01-31 MED FILL — SEROQUEL XR 50 MG TABLET,EXTENDED RELEASE: 50 mg | ORAL | Qty: 3

## 2015-01-31 MED FILL — ROCURONIUM 10 MG/ML IV: 10 mg/mL | INTRAVENOUS | Qty: 60

## 2015-01-31 NOTE — Progress Notes (Signed)
Problem: Mobility Impaired (Adult and Pediatric)  Goal: *Acute Goals and Plan of Care (Insert Text)  Physical Therapy Goals  Initiated 01/30/2015 and to be accomplished within 7 day(s)  1. Patient will move from supine to sit and sit to supine , scoot up and down and roll side to side in bed with modified independence.   2. Patient will transfer from bed to chair and chair to bed with modified independence using the least restrictive device.  3. Patient will perform sit to stand with modified independence.  4. Patient will ambulate with modified independence for 150 feet with the least restrictive device.   5. Patient will be independent verbalizing anterior hip precautions.  6. Patient will be independent with home exercise program.   Outcome: Progressing Towards Goal  PHYSICAL THERAPY TREATMENT     Patient: [name] [name] (64 y.o. female)  Date: 01/31/2015  Diagnosis: osteroarthritis  Osteoarthritis of hips, bilateral Osteoarthritis of hips, bilateral    Procedure(s) (LRB):  right total hip replacement - anterior approach (Right) 2 Days Post-Op  Precautions: WBAT (anterior hip)  Chart, physical therapy assessment, plan of care and goals were reviewed.      ASSESSMENT:  Pt presents back in bed in left sidelying with no pillow between LE's c/o'ng pain. Nsg reports patient requesting pain medication QHR. Agreeing to bed level ex. And ambulation. Upon coming to sit EOB pt is noted to throw herself backward and provided MOD A to maintain R LE in neutral position. Provided education on positioning in bed, pillow between LE's when in left sidelying, pace activity and do not throw self backward and twist as well and placing walker outside of BOS during turn and keeping feet planted. Husband present and providing reinforcement for education points.  Ice pack placed at end of treatment  EDUCATION:See assess. Continued to provide cueing for deep breathing to promote relaxation, decrease anxiety. NR for this.   Progression toward goals:              Improving slowly and progressing toward goals       PLAN:  Patient continues to benefit from skilled intervention to address the above impairments.  Continue treatment per established plan of care.  Discharge Recommendations:  home v SNF  Further Equipment Recommendations for Discharge:  has RW at home per patient and husband       SUBJECTIVE:   Patient stated ???It just  hurts.???      OBJECTIVE DATA SUMMARY:   Critical Behavior:  Neurologic State: Alert  Orientation Level: Oriented X4  Cognition: Impulsive, Follows commands  Safety/Judgement: Decreased awareness of need for assistance     G CODE:na  Functional Mobility Training:  Bed Mobility:  Rolling: Modified independent  Supine to Sit: see assessment. MOderate  Assistance for mgt of R LE  Sit to Supine: Minimum assistance (for R LE)  Transfers:  Sit to Stand: Stand-by asssistance  Stand to Sit: Stand-by asssistance;Contact guard assistance;Additional time;Assist x1;Other (comment) (VC for safty awareness)  Interventions: Safety awareness training;Tactile cues;Verbal cues     Balance:  Sitting: Intact  Standing: With support  Standing - Static: Good  Standing - Dynamic : Good  Ambulation/Gait Training:  Distance (ft): 40 Feet (ft)  Assistive Device: Walker, rolling  Ambulation - Level of Assistance: Minimal assistance        Gait Abnormalities: Antalgic;Decreased step clearance;Path deviations;Step to gait        Base of Support: Shift to left;Widened     Speed/Cadence: Slow  Step Length: Left shortened;Right shortened  Swing Pattern: Right asymmetrical     Interventions: Safety awareness training;Tactile cues;Verbal cues     Therapeutic Exercises:   R Le AP x 10; quad and glut isometric x 10 w/3 sec hold, VC and tactile; AAROM for R hip and knee F/E x 10  Vital Signs  Temp: 99.3 ??F (37.4 ??C)     Pulse (Heart Rate): (!) 118     BP: 131/85     Resp Rate: 17     O2 Sat (%): 96 %  Pain:  Pre treatment pain level:  8   Post treatment pain level: 7  Pain Scale 1: Numeric (0 - 10)  Pain Intensity 1: 8  Pain Location 1: Hip  Pain Orientation 1: Right  Pain Description 1: Aching  Pain Intervention(s) 1: applied ice pack and positioned left sidelying with pillow between LE's and pt reports comfortable.  Activity Tolerance:   fair     After treatment:   Patient left in no apparent distress in bed applied ice pack and positioned left sidelying with pillow between LE's and pt reports comfortable.  Call bell left within reach  Nursing notified  Husband present      Ron AgeeKaren E Shiley, PTA   Time Calculation: 25 mins

## 2015-01-31 NOTE — Progress Notes (Signed)
Shift Summary Note:  Assumed care of patient in bed Continues to complain of pain level 7-8/10. Medicated x3 for pain. Slept after most times medications given. Uneventful night Call bell available bed alarm on for safety.  Patient Vitals for the past 12 hrs:   Temp Pulse Resp BP SpO2   01/31/15 0507 99.2 ??F (37.3 ??C) (!) 120 18 124/88 92 %   01/31/15 0027 100.3 ??F (37.9 ??C) 92 18 113/75 90 %   01/30/15 2004 99.7 ??F (37.6 ??C) 87 18 121/81 94 %

## 2015-01-31 NOTE — Other (Signed)
Bedside and Verbal shift change report given to Jessica RN/Diane RN (oncoming nurse) by VERONICA A JACKSON, RN   (offgoing nurse). Report included the following information SBAR, Kardex, MAR and Recent Results.

## 2015-01-31 NOTE — Progress Notes (Signed)
IN chart monitoring MEWS score.

## 2015-01-31 NOTE — Progress Notes (Signed)
Problem: Hip Fracture: Post-Op Day 1  Goal: Respiratory  Outcome: Progressing Towards Goal  Patient is on room air, using incentive spirometry

## 2015-01-31 NOTE — Progress Notes (Signed)
Medicine Progress Note    Patient: [name] [name]   Age:  64 y.o.  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 PRN pain control. Mobility and diet advancement per surgery team.    - HTN - Continue Norvasc/ Chlorthalidone/ Clonidine/ Hydralazine/ Lopressor.     - PTSD - Continue bedtime Seroquel    Tachycardia likely from pain.  Mild fever likely 2/2 surgery however will get UA to r/o infection    DISPO   -Pt unsafe for transfer or discharge at this time for reasons addressed above, continued hospitalization for R hip replacement ongoing assessment and treatment indicated      Anticipated Date of Discharge: per primary  Anticipated Disposition (home, SNF) : SNF monday    Subjective:   Patient seen and examined.  Complains of pain in her leg    Objective:     Visit Vitals   ??? BP 131/85   ??? Pulse (!) 118   ??? Temp 99.3 ??F (37.4 ??C)   ??? Resp 17   ??? Ht 5\' 7"  (1.702 m)   ??? Wt 80 kg (176 lb 5 oz)   ??? SpO2 96%   ??? BMI 27.61 kg/m2       Physical Exam:  General appearance: alert, cooperative, no distress, appears stated age  Head: Normocephalic, without obvious abnormality, atraumatic  Neck: supple, trachea midline  Lungs: clear to auscultation bilaterally  Heart: regular rate and rhythm, S1, S2 normal, no murmur, click, rub or gallop  Abdomen: soft, non-tender. Bowel sounds normal. No masses,  no organomegaly  Extremities: extremities normal, atraumatic, no cyanosis or edema  Skin: Skin color, texture, turgor normal. No rashes or lesions  Neurologic: Grossly normal  PSY: Mood and affect normal, appropriately behaved    Intake and Output:  Current Shift:      Last three shifts:  08/18 1901 - 08/20 0700  In: 760 [P.O.:360]  Out: 750 [Urine:400]    Lab/Data Reviewed:  CMP:   Lab Results   Component Value Date/Time    NA 138 01/31/2015 04:30 AM    K 3.6 01/31/2015 04:30 AM    CL 102 01/31/2015 04:30 AM    CO2 25 01/31/2015 04:30 AM    AGAP 11 01/31/2015 04:30 AM    GLU 148 (H) 01/31/2015 04:30 AM    BUN 12 01/31/2015 04:30 AM    CREA 0.56 (L) 01/31/2015 04:30 AM    GFRAA >60 01/31/2015 04:30 AM    GFRNA >60 01/31/2015 04:30 AM    CA 8.1 (L) 01/31/2015 04:30 AM     CBC:   Lab Results   Component Value Date/Time    WBC 9.1 01/31/2015 04:30 AM    HGB 9.1 (L) 01/31/2015 04:30 AM    HCT 26.9 (L) 01/31/2015 04:30 AM    PLT 126 (L) 01/31/2015 04:30 AM       Medications Reviewed:  Current Facility-Administered Medications   Medication Dose Route Frequency   ??? 0.9% sodium chloride infusion 250 mL  250 mL IntraVENous PRN   ??? amLODIPine (NORVASC) tablet  10 mg  10 mg Oral DAILY   ??? chlorthalidone (HYGROTEN) tablet 25 mg  25 mg Oral DAILY   ??? cloNIDine HCl (CATAPRES) tablet 0.3 mg  0.3 mg Oral BID   ??? metoprolol tartrate (LOPRESSOR) tablet 100 mg  100 mg Oral BID   ??? hydrALAZINE (APRESOLINE) tablet 50 mg  50 mg Oral DAILY   ??? QUEtiapine SR (SEROquel XR) tablet 150 mg  150 mg Oral QHS   ??? sodium chloride (NS) flush 5-10 mL  5-10 mL IntraVENous Q8H   ??? sodium chloride (NS) flush 5-10 mL  5-10 mL IntraVENous PRN   ??? acetaminophen (TYLENOL) tablet 650 mg  650 mg Oral Q4H PRN   ??? HYDROcodone-acetaminophen (NORCO) 7.5-325 mg per tablet 1 Tab  1 Tab Oral Q4H PRN   ??? ferrous sulfate tablet 325 mg  1 Tab Oral TID WITH MEALS   ??? ondansetron (ZOFRAN ODT) tablet 4 mg  4 mg Oral Q4H PRN   ??? diphenhydrAMINE (BENADRYL) injection 12.5 mg  12.5 mg IntraVENous Q4H PRN   ??? diphenhydrAMINE (BENADRYL) capsule 25 mg  25 mg Oral Q4H PRN   ??? bisacodyl (DULCOLAX) tablet 5 mg  5 mg Oral DAILY PRN   ??? LORazepam (ATIVAN) tablet 1 mg  1 mg Oral QHS    ??? enoxaparin (LOVENOX) injection 40 mg  40 mg SubCUTAneous DAILY   ??? hydrALAZINE (APRESOLINE) 20 mg/mL injection 10 mg  10 mg IntraVENous Q6H PRN   ??? HYDROmorphone (PF) (DILAUDID) injection 1 mg  1 mg IntraVENous Q3H PRN       Gevena Barre, MD    January 31, 2015

## 2015-01-31 NOTE — Progress Notes (Signed)
Problem: Pain  Goal: *Control of Pain  Outcome: Not Progressing Towards Goal  Variance: Patient slowly responding  Comments: Patient states pain is always 7-8/10. Patient is sleeping between doses of pain medication.

## 2015-01-31 NOTE — Progress Notes (Addendum)
Bedside and verbal report received from ConroyVeronica, [place]. Report included SBAR, Kardex, MAR, and recent results. Pt is awake, alert, and oriented lying in bed without difficulty. Denies pain. Call bell within reach. Monitoring.    1017 Pt c/o rt hip/leg pain. Prn pain med given as ordered. Tol well. Pt awake and alert lying in bed. Call bell within reach. Monitoring.      1300 Pt awake and alert lying in bed. Family at bedside. NAD. Call bell within reach. Monitoring    1500 Pt resting quietly in bed with eyes closed. Resp even and unlabored. Family member at bedside. Call bell within reach. Monitoring.    1605 Pt c/o rt hip pain 8/10. Norco given as ordered. Pt requesting IV pain med at this time. Pt re-educated on proper med administration times. Pt states understanding but keep repeating same question.Marland Kitchen.Marland Kitchen."Why can't I get it IV now?" Pt lying in bed safely. Family member at bedside. Call bell within reach. Monitoring.    1731 Pt c/o constant rt hip/leg pain. IV Dilaudid given as ordered. Pt awake and alert watching Tv in bed. NAD. Call bell within reach. Monitoring.    1930 Bedside and Verbal shift change report given to Belenda CruiseKristin, Charity fundraiserN (oncoming nurse) by Jaymes Grafferese, RN (offgoing nurse). Report included the following information SBAR, Kardex, MAR and Recent Results.

## 2015-01-31 NOTE — Progress Notes (Signed)
Problem: Hip Fracture: Post-Op Day 2  Goal: Discharge Planning  Outcome: Progressing Towards Goal  Patient desires to go to rehab before going home.

## 2015-01-31 NOTE — Progress Notes (Signed)
Progress Note      Patient: [name] [name]               Sex: female          DOA: 01/29/2015       Date of Birth:  [birth year]      Age:  64 y.o.        LOS:  LOS: 2 days     Status Post: Procedure(s):  right total hip replacement - anterior approach  Surgery Date: 01/29/2015            Subjective:     MAKESHA [name] is a 64 y.o. female who had rt THA doing very well with PT pain under control     Objective:      Visit Vitals   ??? BP 131/85   ??? Pulse (!) 118   ??? Temp 99.3 ??F (37.4 ??C)   ??? Resp 17   ??? Ht 5\' 7"  (1.702 m)   ??? Wt 176 lb 5 oz (80 kg)   ??? SpO2 96%   ??? BMI 27.61 kg/m2       Physical Exam:   Dressing:  clean, dry, intact - same as POD # 1  Wiggles Toes/Ankle  Foot sensation intact to light touch  No foot edema/ +1 Posterior Tibial Pulse    Intake and Output:  Current Shift:     Last three shifts:  08/18 1901 - 08/20 0700  In: 760 [P.O.:360]  Out: 750 [Urine:400]  Voiding Status:  Voiding without need for a Foley Catheter    Lab/Data Reviewed:  Recent Labs      01/31/15   0430   HGB  9.1*   HCT  26.9*   NA  138   K  3.6   CL  102   CO2  25   BUN  12   CREA  0.56*   GLU  148*       Medications Reviewed    Assessment/Plan     Principal Problem:    Osteoarthritis of hips, bilateral (01/29/2015)    Active Problems:    Essential hypertension (07/21/2014)      PTSD (post-traumatic stress disorder) (01/29/2015)        ?? Discharge Planning Rehab .  ?? Continue DVT Prophylaxis.  ?? D/C Monday

## 2015-02-01 MED FILL — CLONIDINE 0.1 MG TAB: 0.1 mg | ORAL | Qty: 3

## 2015-02-01 MED FILL — FERROUS SULFATE 325 MG (65 MG ELEMENTAL IRON) TAB: 325 mg (65 mg iron) | ORAL | Qty: 1

## 2015-02-01 MED FILL — HYDROMORPHONE (PF) 1 MG/ML IJ SOLN: 1 mg/mL | INTRAMUSCULAR | Qty: 1

## 2015-02-01 MED FILL — HYDROCODONE-ACETAMINOPHEN 7.5 MG-325 MG TAB: ORAL | Qty: 1

## 2015-02-01 MED FILL — METOPROLOL TARTRATE 50 MG TAB: 50 mg | ORAL | Qty: 2

## 2015-02-01 MED FILL — CHLORTHALIDONE 25 MG TAB: 25 mg | ORAL | Qty: 1

## 2015-02-01 MED FILL — SEROQUEL XR 50 MG TABLET,EXTENDED RELEASE: 50 mg | ORAL | Qty: 3

## 2015-02-01 MED FILL — AMLODIPINE 10 MG TAB: 10 mg | ORAL | Qty: 1

## 2015-02-01 MED FILL — LORAZEPAM 1 MG TAB: 1 mg | ORAL | Qty: 1

## 2015-02-01 MED FILL — LOVENOX 40 MG/0.4 ML SUBCUTANEOUS SYRINGE: 40 mg/0.4 mL | SUBCUTANEOUS | Qty: 0.4

## 2015-02-01 MED FILL — HYDRALAZINE 25 MG TAB: 25 mg | ORAL | Qty: 2

## 2015-02-01 NOTE — Progress Notes (Signed)
Problem: Mobility Impaired (Adult and Pediatric)  Goal: *Acute Goals and Plan of Care (Insert Text)  Physical Therapy Goals  Initiated 01/30/2015 and to be accomplished within 7 day(s)  1. Patient will move from supine to sit and sit to supine , scoot up and down and roll side to side in bed with modified independence.   2. Patient will transfer from bed to chair and chair to bed with modified independence using the least restrictive device.  3. Patient will perform sit to stand with modified independence.  4. Patient will ambulate with modified independence for 150 feet with the least restrictive device.   5. Patient will be independent verbalizing anterior hip precautions.  6. Patient will be independent with home exercise program.   Outcome: Progressing Towards Goal  PHYSICAL THERAPY TREATMENT     Patient: [name] [name] (64 y.o. female)  Date: 02/01/2015  Diagnosis: osteroarthritis  Osteoarthritis of hips, bilateral Osteoarthritis of hips, bilateral    Procedure(s) (LRB):  right total hip replacement - anterior approach (Right) 3 Days Post-Op  Precautions: WBAT (anterior hip)  Chart, physical therapy assessment, plan of care and goals were reviewed.      ASSESSMENT:  Pt had just returned to bed from the bathroom and refused to walk again due to pain. Pt given meds by NRSing during rx. Pt able to perform HL Add with pillow and resisted ABD; pt continues to require AAROM SLR, abd, and HS  Pt ed on importance and benefits of bed mobility/positioning/exercises every hr, and muscle atrophy; pt verbalized understanding        Progression toward goals:        Improving appropriately and progressing toward goals        Improving slowly and progressing toward goals        Not making progress toward goals and plan of care will be adjusted       PLAN:  Patient continues to benefit from skilled intervention to address the above impairments.  Continue treatment per established plan of care.   Discharge Recommendations:  Home Health  Further Equipment Recommendations for Discharge:  rolling walker and 3 + 1 commode          SUBJECTIVE:   Patient stated ???I just walked back from the bathroom, I do not want to walk.???      OBJECTIVE DATA SUMMARY:   Critical Behavior:  Neurologic State: Alert  Orientation Level: Oriented X4, Appropriate for age  Cognition: Appropriate decision making, Appropriate for age attention/concentration  Safety/Judgement: Decreased awareness of need for assistance  Functional Mobility Training:  Bed Mobility:  Rolling: Modified independent  Supine to Sit: Minimum assistance  Sit to Supine: Minimum assistance  Transfers:  Sit to Stand: Stand-by asssistance  Stand to Sit: Stand-by asssistance  Balance:  Sitting: Intact  Ambulation/Gait Training: Pt declined  Therapeutic Exercises:   Qs, GS, AAROM SLR/abd/HS, and resisted DF/PF/knee ext/HL hip abd/add  Pain:  Pain Scale 1: Numeric (0 - 10)  Pain Intensity 1: 8  Pain Intervention(s) 1: Medication (see MAR);Exercise  Activity Tolerance:   fair; limited by p!  Please refer to the flowsheet for vital signs taken during this treatment.  After treatment:    Patient left in no apparent distress sitting up in chair   Patient left in no apparent distress in bed   Call bell left within reach   Nursing notified   Caregiver present   Bed alarm activated  Ledon Weihe, PTA   Time Calculation: 15 mins

## 2015-02-01 NOTE — Progress Notes (Signed)
Medicine Progress Note    Patient: [name] ShutterMonica R [name]   Age:  64 y.o.  DOA: 01/29/2015   Admit Dx / CC: osteroarthritis  Osteoarthritis of hips, bilateral  LOS:  LOS: 3 days     Assessment/Plan   Principal Problem:    Osteoarthritis of hips, bilateral (01/29/2015)    Active Problems:    Essential hypertension (07/21/2014)      PTSD (post-traumatic stress disorder) (01/29/2015)        Additional Plan notes   Consulted for HTN, no changes have been made.  She is s/p R total hip with plans for SNF tomorrow.      - Right hip arthritis - s/p Right total hip replacement. PRN pain control. Mobility and diet advancement per surgery team.    - HTN - Continue Norvasc/ Chlorthalidone/ Clonidine/ Hydralazine/ Lopressor.     - PTSD - Continue bedtime Seroquel    UA negative    DISPO   -Pt unsafe for transfer or discharge at this time for reasons addressed above, continued hospitalization for R hip replacement ongoing assessment and treatment indicated      Anticipated Date of Discharge: per primary  Anticipated Disposition (home, SNF) : SNF monday    Subjective:   Patient seen and examined.  No complaints today    Objective:     Visit Vitals   ??? BP 119/82 (BP 1 Location: Right arm, BP Patient Position: At rest)   ??? Pulse 95   ??? Temp 98.2 ??F (36.8 ??C)   ??? Resp 18   ??? Ht 5\' 7"  (1.702 m)   ??? Wt 80 kg (176 lb 5 oz)   ??? SpO2 92%   ??? BMI 27.61 kg/m2       Physical Exam:  General appearance: alert, cooperative, no distress, appears stated age  Head: Normocephalic, without obvious abnormality, atraumatic  Neck: supple, trachea midline  Lungs: clear to auscultation bilaterally  Heart: regular rate and rhythm, S1, S2 normal, no murmur, click, rub or gallop  Abdomen: soft, non-tender. Bowel sounds normal. No masses,  no organomegaly  Extremities: extremities normal, atraumatic, no cyanosis or edema  Skin: Skin color, texture, turgor normal. No rashes or lesions  Neurologic: Grossly normal  PSY: Mood and affect normal, appropriately behaved     Intake and Output:  Current Shift:     Last three shifts:  08/19 1901 - 08/21 0700  In: 240 [P.O.:240]  Out: 300 [Urine:300]    Lab/Data Reviewed:  CMP:   No results found for: NA, K, CL, CO2, AGAP, GLU, BUN, CREA, GFRAA, GFRNA, CA, MG, PHOS, ALB, TBIL, TP, ALB, GLOB, AGRAT, SGOT, ALT, GPT  CBC:   No results found for: WBC, HGB, HGBEXT, HCT, HCTEXT, PLT, PLTEXT, HGBEXT, HCTEXT, PLTEXT    Medications Reviewed:  Current Facility-Administered Medications   Medication Dose Route Frequency   ??? 0.9% sodium chloride infusion 250 mL  250 mL IntraVENous PRN   ??? amLODIPine (NORVASC) tablet 10 mg  10 mg Oral DAILY   ??? chlorthalidone (HYGROTEN) tablet 25 mg  25 mg Oral DAILY   ??? cloNIDine HCl (CATAPRES) tablet 0.3 mg  0.3 mg Oral BID   ??? metoprolol tartrate (LOPRESSOR) tablet 100 mg  100 mg Oral BID   ??? hydrALAZINE (APRESOLINE) tablet 50 mg  50 mg Oral DAILY   ??? QUEtiapine SR (SEROquel XR) tablet 150 mg  150 mg Oral QHS   ??? sodium chloride (NS) flush 5-10 mL  5-10 mL IntraVENous Q8H   ???  sodium chloride (NS) flush 5-10 mL  5-10 mL IntraVENous PRN   ??? acetaminophen (TYLENOL) tablet 650 mg  650 mg Oral Q4H PRN   ??? HYDROcodone-acetaminophen (NORCO) 7.5-325 mg per tablet 1 Tab  1 Tab Oral Q4H PRN   ??? ferrous sulfate tablet 325 mg  1 Tab Oral TID WITH MEALS   ??? ondansetron (ZOFRAN ODT) tablet 4 mg  4 mg Oral Q4H PRN   ??? diphenhydrAMINE (BENADRYL) injection 12.5 mg  12.5 mg IntraVENous Q4H PRN   ??? diphenhydrAMINE (BENADRYL) capsule 25 mg  25 mg Oral Q4H PRN   ??? bisacodyl (DULCOLAX) tablet 5 mg  5 mg Oral DAILY PRN   ??? LORazepam (ATIVAN) tablet 1 mg  1 mg Oral QHS   ??? enoxaparin (LOVENOX) injection 40 mg  40 mg SubCUTAneous DAILY   ??? hydrALAZINE (APRESOLINE) 20 mg/mL injection 10 mg  10 mg IntraVENous Q6H PRN   ??? HYDROmorphone (PF) (DILAUDID) injection 1 mg  1 mg IntraVENous Q3H PRN       Gevena Barre, MD    February 01, 2015

## 2015-02-01 NOTE — Other (Addendum)
[id number]: Bedside shift report completed by Belenda CruiseKristin RN.     [phone]: Assessment completed. Assisted patient to bedpan, patient complaining of right hip pain, administered PRN dilaudid for pain relief.     1046: Patient complaining of right hip pain, administered PRN Norco for pain relief.     1408: Administered PRN Norco for pain relief.    1628: Assisted patient to bathroom, Patient complaining of right hip pain and discomfort, will administer PRN Norco for pain,     Bedside and Verbal shift change report given to Dania RN (oncoming nurse) by Tomasa RandPamela Holmes RN (offgoing nurse). Report included the following information SBAR, Kardex, Intake/Output and MAR.

## 2015-02-01 NOTE — Progress Notes (Addendum)
[facility] in bed [facility] TV  1945 pt c/o pain see MAR.advised her of when next pain medication will be due.placed ice pack on hip per pt request  2200 pt c/o pain. See MAR.

## 2015-02-01 NOTE — Progress Notes (Signed)
0227  Pt experiencing expiratory wheezing. Assessed vitals and breath sounds, coarse and VSS. Pt has productive cough with clear mucus. Applied 2L O2 NC. Instructed pt to use ICS. Informed pt that will monitor and follow up. Call bell in reach. Safety maintained.      0309  Rounded on pt. Pt sleeping at this time in NAD. Call bell within reach. Will continue to monitor.     [id number] pt called for pain medication. Medicated pt with prn see MAR> pt requesting food at this time. Pt given overnight meal box. call bell in reach safety maintained.     0405 Pt calling for pain medication. Informed pt that this RN medicated pt with pain medication, pt reporting being confused. Informed pt of next dose timing. Call bell within reach. Will continue to monitor.       Pt remained stable overnight, denies N/V, voiding well, tolerating diet well. Dressing remained intact. Hourly rounds preformed per hospital policy and pt needs addressed accordingly. Safety measures remained in place during entire shift. Call bell within reach.    Bedside and Verbal shift change report given to Mikki SanteePam Holmes RN (oncoming nurse) by Etta GrandchildKristin M Baker   (offgoing nurse). Report included the following information SBAR, Kardex and MAR.

## 2015-02-01 NOTE — Progress Notes (Signed)
Problem: Mobility Impaired (Adult and Pediatric)  Goal: *Acute Goals and Plan of Care (Insert Text)  Physical Therapy Goals  Initiated 01/30/2015 and to be accomplished within 7 day(s)  1. Patient will move from supine to sit and sit to supine , scoot up and down and roll side to side in bed with modified independence.   2. Patient will transfer from bed to chair and chair to bed with modified independence using the least restrictive device.  3. Patient will perform sit to stand with modified independence.  4. Patient will ambulate with modified independence for 150 feet with the least restrictive device.   5. Patient will be independent verbalizing anterior hip precautions.  6. Patient will be independent with home exercise program.   Outcome: Progressing Towards Goal  PHYSICAL THERAPY TREATMENT     Patient: [name] [name] (64 y.o. female)  Date: 02/01/2015  Diagnosis: osteroarthritis  Osteoarthritis of hips, bilateral Osteoarthritis of hips, bilateral    Procedure(s) (LRB):  right total hip replacement - anterior approach (Right) 3 Days Post-Op  Precautions: WBAT (anterior hip)  Chart, physical therapy assessment, plan of care and goals were reviewed.      ASSESSMENT:  Pt is progressing towards goals. Requires Min A for R LE during sup<>sit. Pt able to perform QS; QS; and DF/PF AROM; AAROM for SLR/abd and HS. SBA for sit<>std, and amb. Pt (I) with peri-care. Pt with decrease R Hip/knee flex during swing phase, and decrease R ankle HS; min improvement with VCing.  Pt ed on importance + benefits to perform bed mobility and exercises every 1-2 hours to promote functional mobility, pt verbalizes understanding. Ed on assisting R LE with L LE; pt able to demo properly.   Progression toward goals:  [X]       Improving appropriately and progressing toward goals  [ ]       Improving slowly and progressing toward goals  [ ]       Not making progress toward goals and plan of care will be adjusted       PLAN:   Patient continues to benefit from skilled intervention to address the above impairments.  Continue treatment per established plan of care.  Discharge Recommendations:  Home Health  Further Equipment Recommendations for Discharge:  rolling walker and 3 + 1 commode          SUBJECTIVE:   Patient stated ???I haven't moved or tried any exercise.???      OBJECTIVE DATA SUMMARY:   Critical Behavior:  Neurologic State: Alert  Orientation Level: Oriented X4, Appropriate for age  Cognition: Appropriate decision making, Appropriate for age attention/concentration  Safety/Judgement: Decreased awareness of need for assistance  Functional Mobility Training:  Bed Mobility:  Rolling: Modified independent  Supine to Sit: Minimum assistance  Sit to Supine: Minimum assistance  Transfers:  Sit to Stand: Stand-by asssistance  Stand to Sit: Stand-by asssistance  Balance:  Sitting: Intact  Ambulation/Gait Training:  Distance (ft):  (10' x 2, 25' x 1)  Assistive Device: Walker, rolling  Ambulation - Level of Assistance: Contact guard assistance  Gait Abnormalities: Antalgic;Step to gait;Decreased step clearance  Base of Support: Shift to left;Widened  Speed/Cadence: Slow  Step Length: Left shortened;Right shortened  Swing Pattern: Right asymmetrical     Therapeutic Exercises:   Qs, GS, and DF/PF x 20; AAROM Knee flex, SLR, abd x 10; and sit<>std x 5  Pain:  Pain Scale 1: Numeric (0 - 10)  Pain Intensity 1: 7  Pain Intervention(s) 1: Medication (see MAR);Ambulation/Increased Activity;Exercise  Activity Tolerance:   fair+; fatigued after amb  Please refer to the flowsheet for vital signs taken during this treatment.  After treatment:    Patient left in no apparent distress sitting up in chair   Patient left in no apparent distress in bed   Call bell left within reach   Nursing notified   Caregiver present   Bed alarm activated      Shila Kruczek, PTA   Time Calculation: 38 mins

## 2015-02-02 ENCOUNTER — Inpatient Hospital Stay: Admit: 2015-02-02 | Payer: MEDICARE | Primary: Physician Assistant

## 2015-02-02 MED ORDER — LORAZEPAM 1 MG TAB
1 mg | ORAL_TABLET | Freq: Every evening | ORAL | 0 refills | Status: DC
Start: 2015-02-02 — End: 2015-04-08

## 2015-02-02 MED ORDER — HYDROCODONE-ACETAMINOPHEN 7.5 MG-325 MG TAB
ORAL_TABLET | ORAL | 0 refills | Status: DC | PRN
Start: 2015-02-02 — End: 2015-04-08

## 2015-02-02 MED ORDER — ENOXAPARIN 40 MG/0.4 ML SUB-Q SYRINGE
40 mg/0.4 mL | INJECTION | Freq: Every day | SUBCUTANEOUS | 0 refills | Status: DC
Start: 2015-02-02 — End: 2015-04-08

## 2015-02-02 MED FILL — METOPROLOL TARTRATE 50 MG TAB: 50 mg | ORAL | Qty: 2

## 2015-02-02 MED FILL — HYDROCODONE-ACETAMINOPHEN 7.5 MG-325 MG TAB: ORAL | Qty: 1

## 2015-02-02 MED FILL — HYDROMORPHONE (PF) 1 MG/ML IJ SOLN: 1 mg/mL | INTRAMUSCULAR | Qty: 1

## 2015-02-02 MED FILL — LORAZEPAM 1 MG TAB: 1 mg | ORAL | Qty: 1

## 2015-02-02 MED FILL — CLONIDINE 0.1 MG TAB: 0.1 mg | ORAL | Qty: 3

## 2015-02-02 MED FILL — HYDRALAZINE 25 MG TAB: 25 mg | ORAL | Qty: 2

## 2015-02-02 MED FILL — SEROQUEL XR 50 MG TABLET,EXTENDED RELEASE: 50 mg | ORAL | Qty: 3

## 2015-02-02 MED FILL — BISACODYL 5 MG TAB, DELAYED RELEASE: 5 mg | ORAL | Qty: 1

## 2015-02-02 MED FILL — LOVENOX 40 MG/0.4 ML SUBCUTANEOUS SYRINGE: 40 mg/0.4 mL | SUBCUTANEOUS | Qty: 0.4

## 2015-02-02 MED FILL — FERROUS SULFATE 325 MG (65 MG ELEMENTAL IRON) TAB: 325 mg (65 mg iron) | ORAL | Qty: 1

## 2015-02-02 MED FILL — CHLORTHALIDONE 25 MG TAB: 25 mg | ORAL | Qty: 1

## 2015-02-02 MED FILL — AMLODIPINE 10 MG TAB: 10 mg | ORAL | Qty: 1

## 2015-02-02 NOTE — Progress Notes (Signed)
Chart reviewed.  [facility].  [facility] transport set up for will call.  Spoke with pt/spouse & made them aware of above, both agreeable to transfer.  Pt's nurse made aware of above.  Will cont to follow.  Pat Rizzo,RN,ext. [phone].

## 2015-02-02 NOTE — Progress Notes (Signed)
OT attempted, pt just returning from x-ray, c/o RLE pain 8/10. Notified NSG, Allegra.    Will cont to follow.    Leann Dextradeur, COTA/L

## 2015-02-02 NOTE — Progress Notes (Signed)
Discharge to Rehab facility via medical transport per stretcher, left in no acute distress, all personal belongings at side.

## 2015-02-02 NOTE — Progress Notes (Signed)
[id number] Patient given 1 Norco tablet for a pain level of 8/10 to her surgical site.

## 2015-02-02 NOTE — Progress Notes (Signed)
Progress Note      Patient: [name] [name]               Sex: female          DOA: 01/29/2015       Date of Birth:  [birth year]      Age:  64 y.o.        LOS:  LOS: 4 days               Subjective:   Pt is to go to a rehab today . Her pain seems to be improved . She has been walking a few feet      Objective:      Visit Vitals   ??? BP (!) 132/93   ??? Pulse 91   ??? Temp 98.4 ??F (36.9 ??C)   ??? Resp 18   ??? Ht 5\' 7"  (1.702 m)   ??? Wt 80 kg (176 lb 5 oz)   ??? SpO2 94%   ??? BMI 27.61 kg/m2       Physical Exam:  Pt is alert and has been using a walker   Heart reg rate and rhythm   Lungs good breath sounds heard   Abdomen soft ,obese   Neuro s/p right hip replacement      Lab/Data Reviewed:  CMP: No results found for: NA, K, CL, CO2, AGAP, GLU, BUN, CREA, GFRAA, GFRNA, CA, MG, PHOS, ALB, TBIL, TP, ALB, GLOB, AGRAT, SGOT, ALT, GPT  CBC: No results found for: WBC, HGB, HGBEXT, HCT, HCTEXT, PLT, PLTEXT, HGBEXT, HCTEXT, PLTEXT        Assessment/Plan     Principal Problem:    Osteoarthritis of hips, bilateral (01/29/2015)    Active Problems:    Essential hypertension (07/21/2014)      PTSD (post-traumatic stress disorder) (01/29/2015)        Plan: pt is to go to rehab for her  Hip replacement

## 2015-02-02 NOTE — Progress Notes (Signed)
[name]F SBAR Handoff - [name] [name]                                                                        63 y.o.   female    [name]   Room: 2203/01    [place] [name] [address] [name] [name] [name] [name]  Unit Phone# :  [phone]      [facility]  [facility] [address] [name] [name] [address]  [address] [place] [address]  Dept: [phone]  Loc: [phone]                    SITUATION     Admitted:  01/29/2015         Attending Provider:  Madie Reno, MD       Consultations:  IP CONSULT TO INTERNAL MEDICINE    PCP:  Littie Deeds, MD   [phone]    Treatment Team: Attending Provider: Madie Reno, MD; Physician Assistant: Teressa Senter, PA; Consulting Provider: Gevena Barre, MD; Care Manager: Merian Capron; Utilization Review: Zenaida Niece; Utilization Review: Stan Head; Occupational Therapy Assistant: Meyer Russel, COTA; Physical Therapy Assistant: Joseph Berkshire, PTA    Admitting Dx:  osteroarthritis  Osteoarthritis of hips, bilateral       Principal Problem: Osteoarthritis of hips, bilateral      4 Days Post-Op of   Procedure(s):  right total hip replacement - anterior approach   BY: Madie Reno, MD             ON: 01/29/2015                  Code Status: Full Code                Advance Directives:   Advance Care Planning 01/29/2015   Patient's Healthcare Decision Maker is: Verbal statement (Legal Next of Kin remains as Management consultant)   Primary Decision Maker Name -   Primary Decision Maker Phone Number -   Primary Decision Maker Relationship to Patient -   Confirm Advance Directive Yes, on file    (Send w/patient)   Yes Not W Pt       Isolation:  There are currently no Active Isolations       MDRO: No current active infections    Pain Medications given:  Hydrocodone    Last dose: 02/02/15 at  1500    Special Equipment needed: yes  Type of equipment: walker      (Not currently on dialysis)  (Not currently on dialysis)  (Not currently on dialysis)     BACKGROUND      Allergies:  No Known Allergies    Past Medical History   Diagnosis Date   ??? Arthritis    ??? Chronic pain    ??? GERD (gastroesophageal reflux disease)    ??? Hypertension    ??? Liver disease      HEP C   ??? PTSD (post-traumatic stress disorder)      lived [place]       Past Surgical History   Procedure Laterality Date   ??? Hx gyn  2010  hysterectomy   ??? Hx endoscopy         Prescriptions Prior to Admission   Medication Sig   ??? amLODIPine (NORVASC) 10 mg tablet Take 1 Tab by mouth daily.   ??? chlorthalidone (HYGROTEN) 25 mg tablet Take 1 Tab by mouth daily.   ??? metoprolol tartrate (LOPRESSOR) 100 mg IR tablet Take 1 Tab by mouth two (2) times a day.   ??? SEROQUEL XR 150 mg sr tablet Take 1 Tab by mouth nightly.   ??? cloNIDine HCl (CATAPRES) 0.3 mg tablet    ??? hydrALAZINE (APRESOLINE) 50 mg tablet        Hard scripts included in transfer packet yes    Vaccinations:    There is no immunization history on file for this patient.    Readmission Risks:    Known Risks: fall, pain, infection        The Charlson [employer] Index tool is an evidenced based tool that has more automatic generated information. The tool looks at many different items such as the age of the patient, how many times they were admitted in the last calendar year, current length of stay in the [name] and their diagnosis. All of these items are pulled automatically from information documented in the chart from various places and will generate a score that predicts whether a patient is at low (less than 13), medium (13-20) or high (21 or greater) risk of being readmitted.        ASSESSMENT                Temp: 98.4 ??F (36.9 ??C) (02/02/15 0654) Pulse (Heart Rate): 91 (02/02/15 0654)     Resp Rate: 18 (02/02/15 0654)           BP: (!) 132/93 (02/02/15 0654)     O2 Sat (%): 94 % (02/02/15 0654)     Weight: 80 kg (176 lb 5 oz)    Height:  (170.2 cm) (01/29/15 0917)       If above not within 1 hour of discharge:     BP:_____  P:____  R:____ T:_____ O2 Sat: ___%  O2: ______    Active Orders   Diet    DIET REGULAR         Orientation: oriented to time, place, person and situation     Active Behaviors: None                                   Active Lines/Drains:  (Peg Tube / Foley / CL or S/L?): no    Urinary Status: Voiding     Last BM: Last Bowel Movement Date: 01/29/15     Skin Integrity: Incision (comment) (acquacel dressing)   Wound Hip Right-DRESSING STATUS: Clean, dry, and intact    Wound Hip Right-DRESSING TYPE: Silicone (acquacel dressing)    Mobility: Slightly limited   Weight Bearing Status: WBAT (Weight Bearing as Tolerated)      Gait Training  Assistive Device: Walker, rolling  Ambulation - Level of Assistance: Contact guard assistance  Distance (ft): 30 Feet (ft) (+60ft)  Interventions: Safety awareness training, Tactile cues, Verbal cues         Lab Results   Component Value Date/Time    GLUCOSE 148 01/31/2015 04:30 AM    INR 1.0 01/16/2015 12:34 PM    INR 1.1 08/15/2014 06:29 AM    HGB 9.1 01/31/2015 04:30 AM  HGB 10.0 01/30/2015 09:45 AM    HEMOGLOBIN A1C, EXTERNAL 6.6 01/08/2014        RECOMMENDATION     See After Visit Summary (AVS) for:  ?? Discharge instructions  ?? After [name] Care Plan   ?? Special equipment needed (entered pre-discharge by Care Management)  ?? Medication Reconciliation    ?? Follow up Appointment(s)         Report given/sent by:  Billie LadeAllegra Lewis, RN                    Verbal report given to: Tonna CornerAndre Harris  FAXED to:  called to [phone]         Estimated discharge time:  02/02/2015 at 1630

## 2015-02-02 NOTE — Discharge Summary (Signed)
01/29/2015  8:38 AM    02/02/2015, 8:39 AM    Primary YN:WGNFA Orthopedic / Rheumatologic: Total Hip Replacement  Secondary Dx: Etiological Diagnoses: Arthritis (Rheumatoid Arthritis or Osteoarthritis)    HPI:  Pt has end stage OA and had failed conservative treatment.  Due to the current findings and affected activity of daily living surgical intervention is indicated.  The alternatives, risks, complications as well as expected outcome were discussed, the patient understands and wishes to proceed with surgery    Past Medical History   Diagnosis Date   ??? Arthritis    ??? Chronic pain    ??? GERD (gastroesophageal reflux disease)    ??? Hypertension    ??? Liver disease      HEP C   ??? PTSD (post-traumatic stress disorder)      lived through the [place]       Current Facility-Administered Medications   Medication Dose Route Frequency   ??? 0.9% sodium chloride infusion 250 mL  250 mL IntraVENous PRN   ??? amLODIPine (NORVASC) tablet 10 mg  10 mg Oral DAILY   ??? chlorthalidone (HYGROTEN) tablet 25 mg  25 mg Oral DAILY   ??? cloNIDine HCl (CATAPRES) tablet 0.3 mg  0.3 mg Oral BID   ??? metoprolol tartrate (LOPRESSOR) tablet 100 mg  100 mg Oral BID   ??? hydrALAZINE (APRESOLINE) tablet 50 mg  50 mg Oral DAILY   ??? QUEtiapine SR (SEROquel XR) tablet 150 mg  150 mg Oral QHS   ??? sodium chloride (NS) flush 5-10 mL  5-10 mL IntraVENous Q8H   ??? sodium chloride (NS) flush 5-10 mL  5-10 mL IntraVENous PRN   ??? acetaminophen (TYLENOL) tablet 650 mg  650 mg Oral Q4H PRN   ??? HYDROcodone-acetaminophen (NORCO) 7.5-325 mg per tablet 1 Tab  1 Tab Oral Q4H PRN   ??? ferrous sulfate tablet 325 mg  1 Tab Oral TID WITH MEALS   ??? ondansetron (ZOFRAN ODT) tablet 4 mg  4 mg Oral Q4H PRN   ??? diphenhydrAMINE (BENADRYL) injection 12.5 mg  12.5 mg IntraVENous Q4H PRN   ??? diphenhydrAMINE (BENADRYL) capsule 25 mg  25 mg Oral Q4H PRN   ??? bisacodyl (DULCOLAX) tablet 5 mg  5 mg Oral DAILY PRN    ??? LORazepam (ATIVAN) tablet 1 mg  1 mg Oral QHS   ??? enoxaparin (LOVENOX) injection 40 mg  40 mg SubCUTAneous DAILY   ??? hydrALAZINE (APRESOLINE) 20 mg/mL injection 10 mg  10 mg IntraVENous Q6H PRN   ??? HYDROmorphone (PF) (DILAUDID) injection 1 mg  1 mg IntraVENous Q3H PRN       Review of patient's allergies indicates no known allergies.    Physical Exam:  General A&O x3 NAD, well developed, well nourished, normal affect  Heart: S1-S2, RRR  Lungs: CTA Bilat  Abd: soft NT, ND  Ext: n/v intact    Hospital Course:    Pt. Had rightOrthopedic / Rheumatologic: Total Hip Replacement    Post -op Course:  The patient tolerated the procedure well.  They were followed by internal medicine for help with medical management.  Pt. Was place on Abx pre and post-op for prophylaxis against infection as well as coumadin pre and post-op for prophylaxis against DVT.    Vitals signs remained stable, remained af.  The wound wasclean, dry, no drainage.  Pain was well controlled.  Pt. Had negative calf tenderness or swelling, no evidence for DVT.  Patient had PT/OT consult  for evaluation and treatment.    CBC  Lab Results   Component Value Date/Time    WBC 9.1 01/31/2015 04:30 AM    RBC 3.03 (L) 01/31/2015 04:30 AM    HCT 26.9 (L) 01/31/2015 04:30 AM    MCV 88.8 01/31/2015 04:30 AM    MCH 30.0 01/31/2015 04:30 AM    MCHC 33.8 01/31/2015 04:30 AM    RDW 12.7 01/31/2015 04:30 AM     Coagulation  Lab Results   Component Value Date    INR 1.0 01/16/2015    APTT 30.9 01/16/2015      Basic Metabolic Profile  Lab Results   Component Value Date    NA 138 01/31/2015    CO2 25 01/31/2015    BUN 12 01/31/2015       Discharge Plan:  The patient will be d/c'd to Skilled Nursing WBAT.  Follow up with Dr. Donato Schultz in 12-14 days.  Call with any questions or concerns.

## 2015-02-02 NOTE — Progress Notes (Signed)
Problem: Mobility Impaired (Adult and Pediatric)  Goal: *Acute Goals and Plan of Care (Insert Text)  Physical Therapy Goals  Initiated 01/30/2015 and to be accomplished within 7 day(s)  1. Patient will move from supine to sit and sit to supine , scoot up and down and roll side to side in bed with modified independence.   2. Patient will transfer from bed to chair and chair to bed with modified independence using the least restrictive device.  3. Patient will perform sit to stand with modified independence.  4. Patient will ambulate with modified independence for 150 feet with the least restrictive device.   5. Patient will be independent verbalizing anterior hip precautions.  6. Patient will be independent with home exercise program.   Outcome: Progressing Towards Goal  PHYSICAL THERAPY TREATMENT     Patient: [name] [name] (16(64 y.o. female)  Date: 02/02/2015  Diagnosis: osteroarthritis  Osteoarthritis of hips, bilateral Osteoarthritis of hips, bilateral    Procedure(s) (LRB):  right total hip replacement - anterior approach (Right) 4 Days Post-Op  Precautions: WBAT (anterior hip)  Chart, physical therapy assessment, plan of care and goals were reviewed.      ASSESSMENT:  Pt required increase encouragement to participate in PT intervention this am but agreed after pt education on importance of performing OOB tasks. Pt amb 8830ft+15ft CGA using FWW, demonstrating slow cadence, decreased step length/step clearance, widened BOS, and increase forward flexed posture during gait. Assisted pt to the bathroom and performed static/dynamic standing (S) with UE activity+min cues for maintaining total hip precautions.     EDUCATION: Pt education on benefits of exercise, total hip precautions, and importance of participating in OOB activities to improve strength, endurance, and overall functional mobility.      Progression toward goals:        Improving appropriately and progressing toward goals   X    Improving slowly and progressing toward goals        Not making progress toward goals and plan of care will be adjusted       PLAN:  Patient continues to benefit from skilled intervention to address the above impairments.  Continue treatment per established plan of care.  Discharge Recommendations:  Skilled Nursing Facility  Further Equipment Recommendations for Discharge: To be determined       SUBJECTIVE:   Patient stated "I just walked to the bathroom".       OBJECTIVE DATA SUMMARY:   Critical Behavior:  Neurologic State: Alert  Orientation Level: Oriented X4  Cognition: Appropriate decision making, Appropriate for age attention/concentration, Appropriate safety awareness, Follows commands  Safety/Judgement: Decreased awareness of need for assistance     G CODE:  Functional Mobility Training:  Bed Mobility:  Rolling: Modified independent  Supine to Sit: Minimum assistance  Sit to Supine: Minimum assistance  Transfers:  Sit to Stand: Stand-by asssistance  Stand to Sit: Stand-by asssistance  Interventions: Safety awareness training;Verbal cues  Balance:  Sitting: Intact  Standing: With support  Standing - Static: Good  Standing - Dynamic : Good  Ambulation/Gait Training:  Distance (ft): 30 Feet (ft) (+5615ft)  Assistive Device: Walker, rolling  Ambulation - Level of Assistance: Contact guard assistance  Gait Abnormalities: Antalgic;Decreased step clearance;Step to gait  Base of Support: Center of gravity altered;Shift to left;Widened  Speed/Cadence: Slow  Step Length: Left shortened;Right shortened  Neuro Re-Education:  Therapeutic Exercises:   Supine TE: ankle pumps (AROM), AAROM: (hip/knee flexion and extension, and hip abd/add) x 10 reps  Vital  Signs  Temp: 98.4 ??F (36.9 ??C)     Pulse (Heart Rate): 91     BP: (!) 132/93     Resp Rate: 18     O2 Sat (%): 94 %  Pain:  Pre treatment pain level: 5/10  Post treatment pain level: 5/10  Pain Scale 1: Numeric (0 - 10)  Pain Intensity 1: 5  Pain Location 1: Hip   Pain Orientation 1: Right  Pain Description 1: Aching  Pain Intervention(s) 1: Medication (see MAR)  Activity Tolerance:   Fair     After treatment:   Patient left in no apparent distress sitting up in chair  Patient left in no apparent distress in bed X  Call bell left within reach X  Nursing notified   Caregiver present  Bed alarm activated      Joseph Berkshire, PTA   Time Calculation: 23 mins

## 2015-02-02 NOTE — Progress Notes (Signed)
Problem: Self Care Deficits Care Plan (Adult)  Goal: *Acute Goals and Plan of Care (Insert Text)  Occupational Therapy Goals  Initiated 01/30/2015 within 7 day(s).    1. Patient will perform self-feeding and grooming with modified independence  2. Patient will perform bathing with modified independence using adaptive equipment/ modified technique.  3. Patient will perform upper body dressing with modified independence.  4. Patient will perform lower body dressing with modified independence using adaptive equipment/ modified technique.  5. Patient will perform toilet transfers with modified independence using adaptive equipment/ modified technique.  6. Patient will perform all aspects of toileting with modified independence.  7. Patient will participate in upper extremity therapeutic exercise/activities with modified independence for 5 minutes.   8. Patient will utilize energy conservation techniques during functional activities with verbal cues.   Outcome: Progressing Towards Goal  OCCUPATIONAL THERAPY TREATMENT     Patient: [name] [name] (64 y.o. female)  Date: 02/02/2015  Diagnosis: osteroarthritis  Osteoarthritis of hips, bilateral Osteoarthritis of hips, bilateral    Procedure(s) (LRB):  right total hip replacement - anterior approach (Right) 4 Days Post-Op  Precautions: WBAT (anterior hip)  Chart, occupational therapy assessment, plan of care, and goals were reviewed.      ASSESSMENT:  Pt cont c/o R hip pain 8/10, warm and edematous. Pt agreeable to EOB activity. Pt requires Min Assist, assisting w/RLE, and bed mobility. Pt educated on use of adaptive equipment w/LB ADLs, donning/doffing L sock only w/AE, requiring SBA. Anticipate discharge to SNF today.  EDUCATION Pt educated on benefits and expectations of SNF.  Progression toward goals:  [ ]           Improving appropriately and progressing toward goals  [X]           Improving slowly and progressing toward goals   [ ]           Not making progress toward goals and plan of care will be adjusted       PLAN:  Patient continues to benefit from skilled intervention to address the above impairments. Continue treatment per established plan of care.  Discharge Recommendations:  Skilled Nursing Facility  Further Equipment Recommendations for Discharge:   TBD by SNF staff       SUBJECTIVE:   Patient stated ???The nurse won't give me my pain medicine.???      OBJECTIVE DATA SUMMARY:         Cognitive/Behavioral Status:  Neurologic State: Alert  Orientation Level: Oriented X4  Cognition: Appropriate for age attention/concentration, Follows commands  Safety/Judgement: Decreased awareness of need for assistance  Functional Mobility and Transfers for ADLs:              Bed Mobility:  Rolling: Modified independent  Supine to Sit: Minimum assistance (requires assist w/RLE)  Sit to Supine: Minimum assistance (requires assist w/RLE)  Scooting: Stand-by asssistance (along EOB)              Balance:  Sitting: Intact  ADL Intervention:  Lower Body Dressing Assistance  Socks: Stand-by assistance (L sock only)  Leg Crossed Method Used: No  Position Performed: Seated edge of bed  Cues: Don;Doff  Adaptive Equipment Used: Reacher;Sock aid     Pain:  Pre Treatment:8  Post Treatment:8  Pain Scale 1: Numeric (0 - 10)  Pain Intensity 1: 8  Pain Location 1: Hip  Pain Orientation 1: Right  Pain Description 1: Aching  Pain Intervention(s) 1: Nurse notified;Ice     Activity Tolerance:  Fair     Please refer to the flowsheet for vital signs taken during this treatment.  After treatment:   [ ]   Patient left in no apparent distress sitting up in chair  [X]   Patient left in no apparent distress in bed  [X]   Call bell left within reach  [X]   Nursing notified  [ ]   Caregiver present  [ ]   Bed alarm activated     Leann  Dextradeur, COTA  Time Calculation: 23 mins

## 2015-02-11 ENCOUNTER — Ambulatory Visit
Admit: 2015-02-11 | Discharge: 2015-02-11 | Payer: MEDICARE | Attending: Orthopaedic Surgery | Primary: Physician Assistant

## 2015-02-11 DIAGNOSIS — M160 Bilateral primary osteoarthritis of hip: Secondary | ICD-10-CM

## 2015-02-11 MED ORDER — OXYCODONE-ACETAMINOPHEN 7.5 MG-325 MG TAB
ORAL_TABLET | Freq: Four times a day (QID) | ORAL | 0 refills | Status: DC | PRN
Start: 2015-02-11 — End: 2015-02-25

## 2015-02-11 NOTE — Progress Notes (Signed)
Pt is being seen today for follow up post surgery to her right hip.  Pt states that she is ready to be discharged from the rehab facility today, planned discharge is Friday.   Pt states that she heard her hip crack yesterday while trying to use the equipment to get her foot and leg in the bed.  Pt states that she had severe pain when she heard and felt the crack.  Pt incision line has no sign of infection, no redness or drainage noted.    Pt is on disability.   Xrays were done at [facility] on 02-10-15.    HM reviewed with pt and informed to follow up with any outdated HM that may need to be done.      Brief history has been reviewed with pt at time of OV.
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
## 2015-02-11 NOTE — Patient Instructions (Signed)
Learning About Stitches and Staples Removal  When are stitches and staples removed?  Your doctor will tell you when to have your stitches or staples removed, usually in 7 to 14 days. How long you'll be told to wait will depend on things like where the wound is located, how big and how deep the wound is, and what your general health is like. Do not remove the stitches on your own.  Stitches on the face are usually removed within a week. But stitches and staples on other areas of the body, such as on the back or belly or over a joint, may need to stay in place longer, often a week or two. Be sure to follow your doctor's instructions.  How are stitches and staples removed?  It usually doesn't hurt when the doctor removes the stitches or staples. You may feel a tug as each stitch or staple is removed.  ?? You will either be seated or lying down.  ?? To remove stitches, the doctor will use scissors to cut each of the knots and then pull the threads out.  ?? To remove staples, the doctor will use a tool to take out the staples one at a time.  ?? The area may still feel tender after the stitches or staples are gone. But it should feel better within a few minutes or up to a few hours.  What can you expect after stitches and staples are removed?  Depending on the type and location of the cut, you will have a scar. Scars usually fade over time. Keep the area clean, but you won't need a bandage.  When should you call for help?  Call your doctor now or seek immediate medical care if:  ?? You have new pain, or your pain gets worse.  ?? You have trouble moving the area near the scar.  ?? You have symptoms of infection, such as:  ?? Increased pain, swelling, warmth, or redness around the scar.  ?? Red streaks leading from the scar.  ?? Pus draining from the scar.  ?? A fever.  Watch closely for changes in your health, and be sure to contact your doctor if:   ?? The scar opens.  ?? You do not get better as expected.   Follow-up care is a key part of your treatment and safety. Be sure to make and go to all appointments, and call your doctor if you do not get better as expected. It's also a good idea to keep a list of the medicines you take.  Where can you learn more?  Go to http://www.healthwise.net/GoodHelpConnections  Enter L659 in the search box to learn more about "Learning About Stitches and Staples Removal."  ?? 2006-2016 Healthwise, Incorporated. Care instructions adapted under license by [employer] (which disclaims liability or warranty for this information). This care instruction is for use with your licensed healthcare professional. If you have questions about a medical condition or this instruction, always ask your healthcare professional. Healthwise, Incorporated disclaims any warranty or liability for your use of this information.  Content Version: 10.9.538570; Current as of: December 31, 2013

## 2015-02-14 ENCOUNTER — Encounter: Primary: Physician Assistant

## 2015-02-15 ENCOUNTER — Encounter: Admit: 2015-02-15 | Discharge: 2015-02-15 | Payer: MEDICARE | Primary: Physician Assistant

## 2015-02-18 ENCOUNTER — Encounter: Admit: 2015-02-18 | Discharge: 2015-02-18 | Payer: MEDICARE | Primary: Physician Assistant

## 2015-02-18 ENCOUNTER — Encounter: Primary: Physician Assistant

## 2015-02-19 ENCOUNTER — Ambulatory Visit
Admit: 2015-02-19 | Discharge: 2015-02-19 | Payer: MEDICARE | Attending: Orthopaedic Surgery | Primary: Physician Assistant

## 2015-02-19 ENCOUNTER — Encounter: Admit: 2015-02-19 | Primary: Physician Assistant

## 2015-02-19 ENCOUNTER — Encounter: Admit: 2015-02-20 | Discharge: 2015-02-20 | Payer: MEDICARE | Primary: Physician Assistant

## 2015-02-19 DIAGNOSIS — M160 Bilateral primary osteoarthritis of hip: Secondary | ICD-10-CM
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
## 2015-02-19 NOTE — Patient Instructions (Signed)
Hip Pain: Care Instructions  Your Care Instructions  Hip pain may be caused by many things, including overuse, a fall, or a twisting movement. Another cause of hip pain is arthritis. Your pain may increase when you stand up, walk, or squat. The pain may come and go or may be constant.  Home treatment can help relieve hip pain, swelling, and stiffness. If your pain is ongoing, you may need more tests and treatment.  Follow-up care is a key part of your treatment and safety. Be sure to make and go to all appointments, and call your doctor if you are having problems. It???s also a good idea to know your test results and keep a list of the medicines you take.  How can you care for yourself at home?  ?? Take pain medicines exactly as directed.  ?? If the doctor gave you a prescription medicine for pain, take it as prescribed.  ?? If you are not taking a prescription pain medicine, ask your doctor if you can take an over-the-counter medicine.  ?? Rest and protect your hip. Take a break from any activity, including standing or walking, that may cause pain.  ?? Put ice or a cold pack against your hip for 10 to 20 minutes at a time. Try to do this every 1 to 2 hours for the next 3 days (when you are awake) or until the swelling goes down. Put a thin cloth between the ice and your skin.  ?? Sleep on your healthy side with a pillow between your knees, or sleep on your back with pillows under your knees.  ?? If there is no swelling, you can put moist heat, a heating pad, or a warm cloth on your hip. Do gentle stretching exercises to help keep your hip flexible.  ?? Learn how to prevent falls. Have your vision and hearing checked regularly. Wear slippers or shoes with a nonskid sole.  ?? Stay at a healthy weight.  ?? Wear comfortable shoes.  When should you call for help?  Call 911 anytime you think you may need emergency care. For example, call if:  ?? You have sudden chest pain and shortness of breath, or you cough up blood.   ?? You are not able to stand or walk or bear weight.  ?? Your buttocks, legs, or feet feel numb or tingly.  ?? Your leg or foot is cool or pale or changes color.  ?? You have severe pain.  Call your doctor now or seek immediate medical care if:  ?? You have signs of infection, such as:  ?? Increased pain, swelling, warmth, or redness in the hip area.  ?? Red streaks leading from the hip area.  ?? Pus draining from the hip area.  ?? A fever.  ?? You have signs of a blood clot, such as:  ?? Pain in your calf, back of the knee, thigh, or groin.  ?? Redness and swelling in your leg or groin.  ?? You are not able to bend, straighten, or move your leg normally.  ?? You have trouble urinating or having bowel movements.  Watch closely for changes in your health, and be sure to contact your doctor if:  ?? You do not get better as expected.  Where can you learn more?  Go to http://www.healthwise.net/GoodHelpConnections  Enter Z720 in the search box to learn more about "Hip Pain: Care Instructions."  ?? 2006-2016 Healthwise, Inc[employer]. Care instructions adapted under license by [employer] (which   disclaims liability or warranty for this information). This care instruction is for use with your licensed healthcare professional. If you have questions about a medical condition or this instruction, always ask your healthcare professional. Healthwise, Incorporated disclaims any warranty or liability for your use of this information.  Content Version: 10.9.538570; Current as of: May 02, 2014

## 2015-02-19 NOTE — Progress Notes (Signed)
Patient reports a fall 2 days ago while trying to walk to the bathroom. Complaints of right hip pain.

## 2015-02-20 ENCOUNTER — Encounter: Admit: 2015-02-20 | Discharge: 2015-02-20 | Payer: MEDICARE | Primary: Physician Assistant

## 2015-02-20 ENCOUNTER — Encounter: Primary: Physician Assistant

## 2015-02-21 NOTE — Progress Notes (Signed)
Xray only

## 2015-02-23 ENCOUNTER — Encounter: Admit: 2015-02-23 | Discharge: 2015-02-23 | Payer: MEDICARE | Primary: Physician Assistant

## 2015-02-24 ENCOUNTER — Encounter: Admit: 2015-02-24 | Discharge: 2015-02-24 | Payer: MEDICARE | Primary: Physician Assistant

## 2015-02-24 ENCOUNTER — Encounter: Primary: Physician Assistant

## 2015-02-25 ENCOUNTER — Encounter

## 2015-02-25 NOTE — Telephone Encounter (Signed)
Last Visit: 02/19/2015 with MD Alfahd    Date of Surgery: 01/29/2015   Next Appointment: 03/11/2015 with MD Alfahd   Previous Refill Encounters: 02/11/2015 per MD Alfahd #[id number]     Requested Prescriptions     Pending Prescriptions Disp Refills   ??? oxyCODONE-acetaminophen (PERCOCET) 7.5-325 mg per tablet 50 Tab 0     Sig: Take 1 Tab by mouth every six (6) hours as needed for Pain. Max Daily Amount: 4 Tabs. Indications: PAIN

## 2015-02-26 ENCOUNTER — Encounter: Admit: 2015-02-26 | Discharge: 2015-02-26 | Payer: MEDICARE | Primary: Physician Assistant

## 2015-02-26 NOTE — Telephone Encounter (Signed)
Patient left message stating, "This prescription I received for Percocet is not working. " It appears the refill request for Percocet has not been responded to as of yet. Please advise.

## 2015-02-27 ENCOUNTER — Encounter: Primary: Physician Assistant

## 2015-02-27 MED ORDER — OXYCODONE-ACETAMINOPHEN 7.5 MG-325 MG TAB
ORAL_TABLET | Freq: Four times a day (QID) | ORAL | 0 refills | Status: DC | PRN
Start: 2015-02-27 — End: 2015-03-10

## 2015-02-27 NOTE — Telephone Encounter (Signed)
Contacted pt at her home number. Informed pt that prescription was available to be picked up at the front desk.

## 2015-03-02 ENCOUNTER — Encounter: Primary: Physician Assistant

## 2015-03-03 ENCOUNTER — Encounter: Admit: 2015-03-03 | Discharge: 2015-03-03 | Payer: MEDICARE | Primary: Physician Assistant

## 2015-03-04 ENCOUNTER — Encounter: Primary: Physician Assistant

## 2015-03-05 ENCOUNTER — Encounter: Admit: 2015-03-05 | Discharge: 2015-03-05 | Payer: MEDICARE | Primary: Physician Assistant

## 2015-03-07 ENCOUNTER — Emergency Department: Admit: 2015-03-07 | Payer: MEDICARE | Primary: Physician Assistant

## 2015-03-07 ENCOUNTER — Inpatient Hospital Stay
Admit: 2015-03-07 | Discharge: 2015-03-07 | Disposition: A | Discharge status: Home / Self Care | Payer: MEDICARE | Attending: Emergency Medicine

## 2015-03-07 DIAGNOSIS — M25551 Pain in right hip: Secondary | ICD-10-CM

## 2015-03-07 MED ORDER — KETOROLAC TROMETHAMINE 30 MG/ML INJECTION
30 mg/mL (1 mL) | INTRAMUSCULAR | Status: AC
Start: 2015-03-07 — End: 2015-03-07
  Administered 2015-03-07: 20:00:00 via INTRAMUSCULAR

## 2015-03-07 MED ORDER — GABAPENTIN 300 MG CAP
300 mg | ORAL_CAPSULE | Freq: Three times a day (TID) | ORAL | 0 refills | Status: DC
Start: 2015-03-07 — End: 2015-04-22

## 2015-03-07 MED ORDER — IBUPROFEN 600 MG TAB
600 mg | ORAL_TABLET | Freq: Four times a day (QID) | ORAL | 0 refills | Status: DC | PRN
Start: 2015-03-07 — End: 2015-04-08

## 2015-03-07 MED FILL — KETOROLAC TROMETHAMINE 30 MG/ML INJECTION: 30 mg/mL (1 mL) | INTRAMUSCULAR | Qty: 2

## 2015-03-07 NOTE — Progress Notes (Signed)
Patient asking for pain meds

## 2015-03-07 NOTE — ED Triage Notes (Signed)
01/29/2015 rt hip replacement now having more pain, pt is out of percocet

## 2015-03-07 NOTE — ED Triage Notes (Signed)
Per patient had Hip surgery on Aug 18, states she still has pain

## 2015-03-07 NOTE — Progress Notes (Signed)
Patient states she is out of the Percocet but is still taking the Norco that she got filled on 9/4.

## 2015-03-07 NOTE — ED Provider Notes (Signed)
[facility]  EMERGENCY DEPARTMENT TREATMENT REPORT  NAME:  [name] [name], [name] [name]  SEX:   F  ADMIT: 03/07/2015  DOB:   January 18, 1951  MR#    [id number]  ROOM:  [id number]  TIME DICTATED: 03 15 AM  ACCT#  [id number]        CHIEF COMPLAINT:  Right hip pain.    HISTORY OF PRESENT ILLNESS:  This is a 64 year old female who had right hip replacement done on 08/18   saying that she is still having pain.  She is out of her Percocet and so she   came in here because of the degree of her pain and because she is out of her   Percocet and she feels like she needs a refill of her Percocet.  Her   orthopedic surgeon is at a different hospital and she said that she does not   know what he is going to do because it is not related to surgery; however,   this pain is from where she had her hip surgery.  She has had no trauma, no   falls, no fevers.  The pain is worse with movement.  Again, it is at the point   where she had her surgery, but she has had no redness, no fevers, no   swelling, no signs of infection.    REVIEW OF SYSTEMS:  CONSTITUTIONAL:  No fever, chills.  RESPIRATORY:  No cough, shortness of breath, or wheezing.  CARDIOVASCULAR:  No chest pain, chest pressure, or palpitations.  MUSCULOSKELETAL:  She has pain at the right hip, but no obvious deformities.  INTEGUMENTARY:  She has had no redness, no swelling, no discharge.  NEUROLOGIC:  No sensory or motor deficits.    PAST MEDICAL HISTORY:  Chronic pain syndrome, hypertension, gastroesophageal reflux, PTSD.    PAST SURGICAL HISTORY:  Hip replacement, endoscopy, GYN surgery.    PSYCHIATRIC HISTORY:  Negative except for her PTSD and anxiety.    SOCIAL HISTORY:  She denies alcohol, tobacco or illicit drug use.    MEDICATIONS:  Listed and reviewed in Epic.    ALLERGIES:  NO KNOWN DRUG ALLERGIES.    PHYSICAL EXAMINATION:  VITAL SIGNS:  Blood pressure 141/93, pulse 102, respirations 16, temperature   98.6, O2 sats are 96% on room air.   GENERAL APPEARANCE:  Patient appears well developed and well nourished.    Appearance and behavior are age and situation appropriate.  She is   uncomfortable, in moderate distress.  HEAD:  Atraumatic, normocephalic.  Eyes:  Conjunctivae clear, lids normal.  Pupils equal, symmetrical, and   normally reactive.  Ears/Nose:  Hearing is grossly intact to voice.  Internal and external   examinations of the ears and nose are unremarkable.  Mouth/Throat:  Surfaces of the pharynx, palate, and tongue are pink, moist,   and without lesions.  NECK:  Supple, nontender.  RESPIRATORY:  Clear and equal breath sounds.  No respiratory distress,   tachypnea, or accessory muscle use.    CARDIOVASCULAR:   Heart regular, without murmurs, gallops, rubs, or thrills.    GASTROINTESTINAL:  Abdomen is soft and nontender.  MUSCULOSKELETAL:  She has tenderness with even light palpation over the right   hip; however, her incision is clean, dry, intact.  There is no redness, no   swelling, no deformities, no purulent discharge.  SKIN:  Warm and dry without rashes.  Skin is unremarkable.  NEUROLOGIC:  Alert, oriented.  Sensation intact, motor strength equal and   symmetric.  There is no facial asymmetry or dysarthria.    Distal sensation is normal, intact and symmetric.    INITIAL ASSESSMENT:  What appears to be an acute exacerbation of chronic hip pain.  I gave her 60   mg of Toradol.  I obtained x-rays which were negative for fracture or   dislocation.  The hardware is in place.  At this time she is safe for   outpatient management.  I advised her that she does need to follow up with her   orthopedic surgeon for further workup and evaluation, but given the degree of   opiate pain medication she has been prescribed as an outpatient I believe   that this is developing into a chronic pain issue and that it is not   appropriately managed in the Emergency Department.  I do feel like it would be    safer for her to go back to her surgeon to determine whether continued   opiates is appropriate given that it has been almost 6 weeks since her   surgery.  I believe that she should be weaned off the Percocet at this point   given that her incision is completely healed, her x-rays are unremarkable and   that this has been an ongoing chronic problem.    CLINICAL IMPRESSION:  Acute exacerbation of chronic right hip pain status post right hip   arthroplasty.    DISPOSITION:  The patient is discharged with verbal and written instructions and a referral   for ongoing care.  The patient is aware that they may return at any time for   new or worsening symptoms.      ___________________  Johny Drilling MD  Dictated By: Marland Kitchen     My signature above authenticates this document and my orders, the final   diagnosis (es), discharge prescription (s), and instructions in the Epic   record.  If you have any questions please contact [phone].    Nursing notes have been reviewed by the physician/ advanced practice   clinician.    RA  D:03/11/2015 03:15:40  T: 03/11/2015 04:40:28  [id number]

## 2015-03-09 ENCOUNTER — Encounter: Primary: Physician Assistant

## 2015-03-10 ENCOUNTER — Encounter: Admit: 2015-03-10 | Discharge: 2015-03-10 | Payer: MEDICARE | Primary: Physician Assistant

## 2015-03-10 ENCOUNTER — Encounter

## 2015-03-10 MED ORDER — OXYCODONE-ACETAMINOPHEN 7.5 MG-325 MG TAB
ORAL_TABLET | Freq: Four times a day (QID) | ORAL | 0 refills | Status: DC | PRN
Start: 2015-03-10 — End: 2015-04-08

## 2015-03-10 NOTE — Telephone Encounter (Signed)
Last Visit: 02/19/2015 with MD Alfahd   Date of Surgery: 01/29/2015 right total hip replacement  Next Appointment: 03/11/2015 with MD Alfahd   Previous Refill Encounters: 02/27/2015 per MD Alfahd #[id number]     Requested Prescriptions     Pending Prescriptions Disp Refills   ??? oxyCODONE-acetaminophen (PERCOCET) 7.5-325 mg per tablet 50 Tab 0     Sig: Take 1 Tab by mouth every six (6) hours as needed for Pain. Max Daily Amount: 4 Tabs. DO NOT FILL BEFORE 03/11/2015   Indications: PAIN

## 2015-03-10 NOTE — Telephone Encounter (Signed)
Contacted pt at her home number. Pt informed that prescription is available to be picked up at the office.

## 2015-03-10 NOTE — Telephone Encounter (Signed)
Pt called to request refill of oxyCODONE-acetaminophen (PERCOCET) 7.5-325 mg to be picked up at [facility] office.  Please contact patient when ready at [phone] or cell phone [phone]

## 2015-03-11 ENCOUNTER — Encounter: Primary: Physician Assistant

## 2015-03-11 ENCOUNTER — Encounter: Attending: Orthopaedic Surgery | Primary: Physician Assistant

## 2015-03-12 ENCOUNTER — Encounter: Primary: Physician Assistant

## 2015-03-13 ENCOUNTER — Encounter: Primary: Physician Assistant

## 2015-03-13 ENCOUNTER — Encounter: Admit: 2015-03-13 | Discharge: 2015-03-13 | Payer: MEDICARE | Primary: Physician Assistant

## 2015-03-18 ENCOUNTER — Ambulatory Visit
Admit: 2015-03-18 | Discharge: 2015-03-18 | Payer: MEDICARE | Attending: Orthopaedic Surgery | Primary: Physician Assistant

## 2015-03-18 DIAGNOSIS — M160 Bilateral primary osteoarthritis of hip: Secondary | ICD-10-CM

## 2015-03-18 MED ORDER — OXYCODONE-ACETAMINOPHEN 7.5 MG-325 MG TAB
ORAL_TABLET | Freq: Two times a day (BID) | ORAL | 0 refills | Status: DC | PRN
Start: 2015-03-18 — End: 2015-04-08

## 2015-03-18 NOTE — Patient Instructions (Signed)
Hip Replacement Surgery: What to Expect at Home  Your Recovery  Hip replacement surgery replaces the worn parts of your hip joint. When you leave the hospital, you will probably be walking with crutches or a walker. You may be able to climb a few stairs and get in and out of bed and chairs. But you will need someone to help you at home for the next few weeks or until you have more energy and can move around better. If there is no one to help you at home, you may go to a rehabilitation center or long-term care center.  You will go home with a bandage and stitches or staples. You can remove the bandage when your doctor tells you to. Your doctor will remove your stitches or staples 10 days to 3 weeks after your surgery. You may still have some mild pain, and the area may be swollen for 3 to 4 months after surgery. Your doctor will give you medicine for the pain.  You will continue the rehabilitation program (rehab) you started in the hospital. The better you do with your rehab exercises, the sooner you will get your strength and movement back. Most people are able to return to work 4 weeks to 4 months after surgery.  This care sheet gives you a general idea about how long it will take for you to recover. But each person recovers at a different pace. Follow the steps below to get better as quickly as possible.  How can you care for yourself at home?  Activity  ?? Your doctor may not want your affected leg to cross the center of your body toward the other leg. If so, your therapist may suggest these ideas:  ?? Do not cross your legs.  ?? Be very careful as you get in or out of bed or a car, so your leg does not cross that imaginary line in the middle of your body.  ?? Rest when you feel tired. You may take a nap, but do not stay in bed all day.  ?? Work with your physical therapist to learn the best way to exercise. You may be able to take frequent, short walks using crutches or a walker. You  will probably have to use crutches or a walker for at least 4 to 6 weeks.  ?? Your doctor may advise you to stay away from activities that put stress on the joint. This includes sports such as tennis, football, and jogging.  ?? Do not sit for longer than 30 to 45 minutes at a time. When you sit, use chairs with arms, and do not sit in low chairs.  ?? Do not bend over more than 90 degrees (like the angle in a letter "L").  ?? Sleep on your back with your legs slightly apart or on your side with a pillow between your knees for about 6 weeks or as your doctor tells you. Do not sleep on your stomach or affected leg.  ?? You may need to take sponge baths until your stitches or staples have been removed. You will probably be able to shower 24 hours after they are removed.  ?? Ask your doctor when you can drive again.  ?? Most people are able to return to work 4 weeks to 4 months after surgery.  ?? Ask your doctor when it is okay for you to have sex.  Diet  ?? By the time you leave the hospital, you will probably be eating your   normal diet. If your stomach is upset, try bland, low-fat foods like plain rice, broiled chicken, toast, and yogurt. Your doctor may recommend that you take iron and vitamin supplements.  ?? Drink plenty of fluids (unless your doctor tells you not to).  ?? Eat healthy foods, and watch your portion sizes. Try to stay at your ideal weight. Too much weight puts more stress on your new hip joint.  ?? You may notice that your bowel movements are not regular right after your surgery. This is common. Try to avoid constipation and straining with bowel movements. You may want to take a fiber supplement every day. If you have not had a bowel movement after a couple of days, ask your doctor about taking a mild laxative.  Medicines  ?? Your doctor will tell you if and when you can restart your medicines. He or she will also give you instructions about taking any new medicines.   ?? If you take blood thinners, such as warfarin (Coumadin), clopidogrel (Plavix), or aspirin, be sure to talk to your doctor. He or she will tell you if and when to start taking those medicines again. Make sure that you understand exactly what your doctor wants you to do.  ?? Your doctor may give you a blood-thinning medicine to prevent blood clots. If you take a blood thinner, be sure you get instructions about how to take your medicine safely. Blood thinners can cause serious bleeding problems. This medicine could be in pill form or as a shot (injection). If a shot is necessary, your doctor will tell you how to do this.  ?? Be safe with medicines. Take pain medicines exactly as directed.  ?? If the doctor gave you a prescription medicine for pain, take it as prescribed.  ?? If you are not taking a prescription pain medicine, ask your doctor if you can take an over-the-counter medicine.  ?? If you think your pain medicine is making you sick to your stomach:  ?? Take your medicine after meals (unless your doctor has told you not to).  ?? Ask your doctor for a different pain medicine.  ?? If your doctor prescribed antibiotics, take them as directed. Do not stop taking them just because you feel better. You need to take the full course of antibiotics.  Incision care  ?? You will have a bandage over the cut (incision) and staples or stitches. Follow your doctor's instructions on when to take the bandage off. Giving the incision air will help it heal.  ?? Your doctor will remove the staples or stitches 10 days to 3 weeks after the surgery and replace them with strips of tape. Leave the strips on for a week or until they fall off.  Exercise  ?? Your rehab program will include a number of exercises to do. Always do them as your therapist tells you.  Ice and elevation  ?? For pain, put ice or a cold pack on the area for 10 to 20 minutes at a time. Put a thin cloth between the ice and your skin.   ?? Your ankle may swell for about 3 months. Prop up your ankle when you ice it or anytime you sit or lie down. Try to keep it above the level of your heart. This will help reduce swelling.  Other instructions  ?? Continue to wear your support stockings as your doctor says. These help to prevent blood clots. The length of time that you will have to wear them   depends on your activity level and the amount of swelling you have. Most people wear these stockings for 4 to 6 weeks after surgery.  ?? Wear medical alert jewelry that says you may need antibiotics before any procedure, including dental work. You can buy this at most drugstores.  Preventing falls is also very important. To prevent falls:  ?? Arrange furniture so that you will not trip on it.  ?? Get rid of throw rugs, and move electrical cords out of the way.  ?? Walk only in areas with plenty of light.  ?? Put grab bars in showers and bathtubs.  ?? Avoid icy or snowy sidewalks.  ?? Wear shoes with sturdy, flat soles.  Follow-up care is a key part of your treatment and safety. Be sure to make and go to all appointments, and call your doctor if you are having problems. It's also a good idea to know your test results and keep a list of the medicines you take.  When should you call for help?  Call 911 anytime you think you may need emergency care. For example, call if:  ?? You passed out (lost consciousness).  ?? You have severe trouble breathing.  ?? You have sudden chest pain and shortness of breath, or you cough up blood.  Call your doctor now or seek immediate medical care if:  ?? You have signs that your hip may be dislocated, including:  ?? Severe pain and not being able to stand.  ?? A crooked leg that looks like your hip is out of position.  ?? Not being able to bend or straighten your leg.  ?? Your leg or foot is cool or pale or changes color.  ?? You cannot feel or move your leg.  ?? You have signs of a blood clot, such as:   ?? Pain in your calf, back of the knee, thigh, or groin.  ?? Redness and swelling in your leg or groin.  ?? Your incision comes open and begins to bleed, or the bleeding increases.  ?? You feel like your heart is racing or beating irregularly.  ?? You have signs of infection, such as:  ?? Increased pain, swelling, warmth, or redness.  ?? Red streaks leading from the incision.  ?? Pus draining from the incision.  ?? A fever.  Watch closely for changes in your health, and be sure to contact your doctor if:  ?? You do not have a bowel movement after taking a laxative.  ?? You do not get better as expected.  Where can you learn more?  Go to http://www.healthwise.net/[employer]  Enter Q746 in the search box to learn more about "Hip Replacement Surgery: What to Expect at Home."  ?? 2006-2016 [employer]. Care instructions adapted under license by [employer] (which disclaims liability or warranty for this information). This care instruction is for use with your licensed healthcare professional. If you have questions about a medical condition or this instruction, always ask your healthcare professional. [employer] disclaims any warranty or liability for your use of this information.  Content Version: 11.0.578772; Current as of: Nov 03, 2014

## 2015-03-18 NOTE — Progress Notes (Signed)
Pt is being seen today for follow up post op of right hip.  Pt is able to ambulate with the use of a 4 point cane.   Pt is ambulating with a slight limp.     Pt is retired.     HM reviewed with pt and informed to follow up with any outdated HM that may need to be done.      Brief history has been reviewed with pt at time of OV.        Patient's blood pressure was elevated at today's office visit. Patient instructed to contact  primary care physician for treatment.   Pt states that she hasnt taken her medication for her blood pressure today.
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
## 2015-03-26 NOTE — Telephone Encounter (Signed)
[facility], referral to see Dr Jamse BelfastKness (under Dr Elsie AmisLeah Ramos NPI [id number]).

## 2015-03-26 NOTE — Telephone Encounter (Signed)
Organ[facility]  Contact number: [phone]

## 2015-03-27 NOTE — Telephone Encounter (Signed)
Done and faxed.

## 2015-03-30 NOTE — Telephone Encounter (Signed)
Patient called stating her recovery from her hip surgery is not going as well as she had hoped. She asked if she could come in for a Toradol shot. Patient was advised to call her surgeon and let him know that her pain is not well controlled. She said she would call them and see if they can help her.

## 2015-03-30 NOTE — Telephone Encounter (Signed)
Contacted Dr. Stormy Cardharkan office in regards to shot.  Awaiting return call in regards to what kind of shot pt is to receive.

## 2015-03-30 NOTE — Telephone Encounter (Signed)
French Anaracy from Dr. Audie PintoAlfhad's office called inquiring about Ms [name] [name] and shots.   French Anaracy may be reached at [phone].

## 2015-03-30 NOTE — Telephone Encounter (Signed)
Pt calling in states that her PCP is willing to giver a shot today and that they are needing permission to do so, please call PCP, Dr. Stormy Cardharkan, [phone] to authorize.

## 2015-04-07 ENCOUNTER — Ambulatory Visit: Admit: 2015-04-07 | Payer: MEDICARE | Attending: Orthopaedic Surgery | Primary: Physician Assistant

## 2015-04-07 ENCOUNTER — Ambulatory Visit: Admit: 2015-04-07 | Discharge: 2015-04-07 | Payer: MEDICARE | Attending: Family | Primary: Physician Assistant

## 2015-04-07 DIAGNOSIS — I10 Essential (primary) hypertension: Secondary | ICD-10-CM

## 2015-04-07 DIAGNOSIS — M160 Bilateral primary osteoarthritis of hip: Secondary | ICD-10-CM

## 2015-04-07 DIAGNOSIS — I160 Hypertensive urgency: Secondary | ICD-10-CM

## 2015-04-07 MED ORDER — DICLOFENAC 75 MG TAB, DELAYED RELEASE
75 mg | ORAL_TABLET | Freq: Two times a day (BID) | ORAL | 0 refills | Status: DC
Start: 2015-04-07 — End: 2015-05-04

## 2015-04-07 MED ORDER — OXYCODONE-ACETAMINOPHEN 5 MG-325 MG TAB
5-325 mg | ORAL_TABLET | Freq: Two times a day (BID) | ORAL | 0 refills | Status: DC | PRN
Start: 2015-04-07 — End: 2015-04-22

## 2015-04-07 NOTE — Progress Notes (Signed)
Pt presented today with increase BP.  Has pt had any falls since last visit? No.  Pt preferred language for health care discussion is english.    Advanced Directive? No    Is someone accompanying this pt? No    Is the patient using any DME equipment during OV? No      1. Have you been to the ER, urgent care clinic since your last visit?  Hospitalized since your last visit?No    2. Have you seen or consulted any other health care providers outside of the [facility] since your last visit?  Include any pap smears or colon screening. No      Pt has a reminder for a "due or due soon" health maintenance. I have asked that she contact his primary care provider for follow-up on this health maintenance.

## 2015-04-07 NOTE — Patient Instructions (Signed)
Hip Arthritis: Care Instructions  Your Care Instructions     Arthritis, also called osteoarthritis, is a breakdown of the tissue (cartilage) that cushions your joints. Many people have some arthritis as they age. When the cartilage in your hip joints wears down, your hip bone rubs against the hip socket. This causes pain and stiffness.  Work with your doctor to find the right mix of treatments for your arthritis. There are things you can do at home to protect your hip joints, ease your pain, and help you stay active. But if your arthritis becomes so bad that you cannot walk, you may need surgery to replace the hip joint.  Follow-up care is a key part of your treatment and safety. Be sure to make and go to all appointments, and call your doctor if you are having problems. It???s also a good idea to know your test results and keep a list of the medicines you take.  How can you care for yourself at home?  ?? Stay at a healthy weight. Being overweight puts extra strain on your hip joints.  ?? Talk to your doctor or physical therapist about exercises that will help ease hip pain. These tips may help.  ?? Stretch to help prevent stiffness and to prevent injury before you exercise. You may enjoy gentle forms of yoga to help keep your joints and muscles flexible.  ?? Walk instead of jog. Other types of exercise that are less stressful on the joints include riding a bike, swimming, and doing water exercise.  ?? Lift weights. Strong muscles help reduce stress on your joints. Stronger thigh muscles, for example, take some of the stress off of the knees and hips. Learn the right way to lift weights so you do not make joint pain worse.  ?? Take pain medicines exactly as directed.  ?? If the doctor gave you a prescription medicine for pain, take it as prescribed.  ?? If you are not taking a prescription pain medicine, ask your doctor if you can take an over-the-counter medicine.   ?? Use a cane, crutch, walker, or another device if you need help to get around. These can help rest your hips. You also can use other things to make life easier, such as a higher toilet seat.  ?? Do not sit in low chairs, which can make it painful to get up.  ?? Put heat or cold on your sore hips as needed. Use whichever helps you most. You also can go back and forth between hot and cold packs.  ?? Apply heat 2 or 3 times a day for 20 to 30 minutes???using a heating pad, hot shower, or hot pack???to relieve pain and stiffness.  ?? Put ice or a cold pack on your sore hips for 10 to 20 minutes at a time to numb the area. Put a thin cloth between the ice and your skin.  ?? Think about talking to your doctor about using capsaicin, a cream you apply to the skin for pain relief.  When should you call for help?  Call your doctor now or seek immediate medical care if:  ?? You have sudden swelling, warmth, or pain in any joint.  ?? You have joint pain and a fever or rash.  ?? You have such bad pain that you cannot use the joint.  Watch closely for changes in your health, and be sure to contact your doctor if:  ?? You have mild joint symptoms that continue even with more   than 6 weeks of care at home.  ?? You do not get better as expected.  ?? You have stomach pain or other problems with your medicine.  Where can you learn more?  Go to http://www.healthwise.net/[employer]Help[employer]ctions  Enter T640 in the search box to learn more about "Hip Arthritis: Care Instructions."  ?? 2006-2016 [employer]. Care instructions adapted under license by [employer] (which disclaims liability or warranty for this information). This care instruction is for use with your licensed healthcare professional. If you have questions about a medical condition or this instruction, always ask your healthcare professional. Healthwise, Incorporated disclaims any warranty or liability for your use of this information.   Content Version: 11.0.578772; Current as of: August 06, 2014

## 2015-04-07 NOTE — ED Triage Notes (Signed)
Pt c/o headache x 2 days with elevated blood pressure.  States was seen by PCP today and was sent to ED.

## 2015-04-07 NOTE — Progress Notes (Signed)
HISTORY OF PRESENT ILLNESS  [name] [name] is a 64 y.o. female.  HPI Comments: Patient was here for elevated blood pressure and worst headache of her life. She says she took all blood pressure medication as prescribed.     Blood Pressure Check   The history is provided by the patient. Associated symptoms include headaches. Pertinent negatives include no chest pain and no shortness of breath.       Review of Systems   Constitutional: Positive for diaphoresis and malaise/fatigue. Negative for chills and fever.   HENT: Negative for ear discharge and ear pain.    Eyes: Negative for blurred vision and double vision.   Respiratory: Negative for shortness of breath.    Cardiovascular: Negative for chest pain and palpitations.   Neurological: Positive for dizziness, weakness and headaches. Negative for tingling, tremors, sensory change, speech change, focal weakness, seizures and loss of consciousness.   Psychiatric/Behavioral: Negative for depression.       Physical Exam   Constitutional: She is oriented to person, place, and time. She appears distressed.   HENT:   Head: Normocephalic and atraumatic.   Eyes: Pupils are equal, round, and reactive to light.   Cardiovascular: Regular rhythm.    Pulmonary/Chest: Breath sounds normal.   Neurological: She is alert and oriented to person, place, and time. No cranial nerve deficit or sensory deficit.   Skin: She is diaphoretic.   Nursing note and vitals reviewed.      ASSESSMENT and PLAN    ICD-10-CM ICD-9-CM    1. Severe hypertension I10 401.9 REFERRAL TO EMERGENCY DEPARTMENT   2. Severe headache R51 784.0 REFERRAL TO EMERGENCY DEPARTMENT

## 2015-04-07 NOTE — Progress Notes (Signed)
Pt is being seen today for right hip pain. Pt is walking with a 4 point cane for ambulation.      Pt is retired.     HM reviewed with pt and informed to follow up with any outdated HM that may need to be done.      Brief history has been reviewed with pt at time of OV.        Patient's blood pressure was elevated at today's office visit. Patient instructed to contact  primary care physician for treatment.

## 2015-04-07 NOTE — Progress Notes (Signed)
[name] [name]  02/23/1951   Chief Complaint   Patient presents with   ??? Hip Pain     right hip        HISTORY OF PRESENT ILLNESS:  [name] [name] is a 64 y.o. 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  Pt is ambulating with a quad-point cane at today's office visit. Pt reports increased right hip pain since her last office visit on 03/18/15. She denies groin pain. Pt reports she had groin pain prior to her hip replacement that resolved after surgery. Her pain now in the the back of the right hip in her lower right back and radiates down her right leg. She cannot lie on the right side because of pain. Pt reports having a fall in her bathtub a week ago. She states she slipped and landed on her right buttock. Her right hip and back pain worsened after the fall. She is able to walk with her cane without difficulty. She reports her right leg seems to give out on her and she looses her balance. Pt reports she stopped going to physical therapy because she "was feeling too bad". Pt reports taking 5 tabs Percocet daily with some relief of pain. Pt reports she has had a headache x 2 days. Her BP is significantly elevated. She reports taking all her BP medications as prescribed. Pt agreeable to seeing walk-in clinic in our office today for her elevated BP. Pt put on their schedule. Pt is retired. Pt denies fevers, night sweats, or weight loss.       No Known Allergies     Past Medical History   Diagnosis Date   ??? Arthritis    ??? Chronic pain    ??? GERD (gastroesophageal reflux disease)    ??? Hypertension    ??? Liver disease      HEP C   ??? PTSD (post-traumatic stress disorder)      [place]      Social History     Social History   ??? Marital status: MARRIED     Spouse name: N/A   ??? Number of children: N/A   ??? Years of education: N/A     Occupational History   ??? Not on file.     Social History Main Topics   ??? Smoking status: Never Smoker    ??? Smokeless tobacco: Never Used   ??? Alcohol use No   ??? Drug use: No   ??? Sexual activity: No     Other Topics Concern   ??? Not on file     Social History Narrative      Past Surgical History   Procedure Laterality Date   ??? Hx gyn  2010     hysterectomy   ??? Hx endoscopy     ??? 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 Hx hip replacement        Family History   Problem Relation Age of Onset   ??? Hypertension Mother    ??? Cancer Maternal Aunt    ??? Alcohol abuse Maternal Grandfather       Current Outpatient Prescriptions   Medication Sig   ??? oxyCODONE-acetaminophen (PERCOCET) 5-325 mg per tablet Take 1 Tab by mouth every twelve (12) hours as needed for Pain. Max Daily Amount: 2 Tabs. Indications: PAIN   ??? diclofenac EC (VOLTAREN) 75 mg EC tablet Take 1 Tab by mouth  two (2) times daily (with meals).   ??? oxyCODONE-acetaminophen (PERCOCET) 7.5-325 mg per tablet Take 1 Tab by mouth two (2) times daily as needed for Pain. Max Daily Amount: 2 Tabs.   ??? oxyCODONE-acetaminophen (PERCOCET) 7.5-325 mg per tablet Take 1 Tab by mouth every six (6) hours as needed for Pain. Max Daily Amount: 4 Tabs. DO NOT FILL BEFORE 03/11/2015   Indications: PAIN   ??? ibuprofen (MOTRIN) 600 mg tablet Take 1 Tab by mouth every six (6) hours as needed for Pain.   ??? gabapentin (NEURONTIN) 300 mg capsule Take 1 Cap by mouth three (3) times daily.   ??? cloNIDine HCl (CATAPRES) 0.3 mg tablet Take 0.3 mg by mouth two (2) times a day.   ??? acetaminophen (TYLENOL) 325 mg tablet Take  by mouth every four (4) hours as needed for Pain.   ??? enoxaparin (LOVENOX) 40 mg/0.4 mL 0.4 mL by SubCUTAneous route daily. Indications: DEEP VEIN THROMBOSIS PREVENTION, post total hip   ??? HYDROcodone-acetaminophen (NORCO) 7.5-325 mg per tablet Take 1 Tab by mouth every four (4) hours as needed. Max Daily Amount: 6 Tabs. Indications: PAIN, post total hip   ??? LORazepam (ATIVAN) 1 mg tablet Take 1 Tab by mouth nightly. Max Daily Amount: 1 mg. Indications: INSOMNIA    ??? amLODIPine (NORVASC) 10 mg tablet Take 1 Tab by mouth daily.   ??? chlorthalidone (HYGROTEN) 25 mg tablet Take 1 Tab by mouth daily.   ??? metoprolol tartrate (LOPRESSOR) 100 mg IR tablet Take 1 Tab by mouth two (2) times a day.   ??? SEROQUEL XR 150 mg sr tablet Take 1 Tab by mouth nightly.   ??? hydrALAZINE (APRESOLINE) 50 mg tablet      No current facility-administered medications for this visit.      Outpatient Prescriptions Marked as Taking for the 04/07/15 encounter (Office Visit) with Madie Reno, MD   Medication Sig Dispense Refill   ??? oxyCODONE-acetaminophen (PERCOCET) 5-325 mg per tablet Take 1 Tab by mouth every twelve (12) hours as needed for Pain. Max Daily Amount: 2 Tabs. Indications: PAIN 40 Tab 0   ??? diclofenac EC (VOLTAREN) 75 mg EC tablet Take 1 Tab by mouth two (2) times daily (with meals). 60 Tab 0   ??? oxyCODONE-acetaminophen (PERCOCET) 7.5-325 mg per tablet Take 1 Tab by mouth two (2) times daily as needed for Pain. Max Daily Amount: 2 Tabs. 40 Tab 0   ??? oxyCODONE-acetaminophen (PERCOCET) 7.5-325 mg per tablet Take 1 Tab by mouth every six (6) hours as needed for Pain. Max Daily Amount: 4 Tabs. DO NOT FILL BEFORE 03/11/2015   Indications: PAIN 50 Tab 0   ??? ibuprofen (MOTRIN) 600 mg tablet Take 1 Tab by mouth every six (6) hours as needed for Pain. 30 Tab 0   ??? gabapentin (NEURONTIN) 300 mg capsule Take 1 Cap by mouth three (3) times daily. 60 Cap 0   ??? cloNIDine HCl (CATAPRES) 0.3 mg tablet Take 0.3 mg by mouth two (2) times a day.     ??? acetaminophen (TYLENOL) 325 mg tablet Take  by mouth every four (4) hours as needed for Pain.     ??? enoxaparin (LOVENOX) 40 mg/0.4 mL 0.4 mL by SubCUTAneous route daily. Indications: DEEP VEIN THROMBOSIS PREVENTION, post total hip 25 Syringe 0   ??? HYDROcodone-acetaminophen (NORCO) 7.5-325 mg per tablet Take 1 Tab by mouth every four (4) hours as needed. Max Daily Amount: 6 Tabs. Indications: PAIN, post total hip 70 Tab 0    ???  LORazepam (ATIVAN) 1 mg tablet Take 1 Tab by mouth nightly. Max Daily Amount: 1 mg. Indications: INSOMNIA 20 Tab 0   ??? amLODIPine (NORVASC) 10 mg tablet Take 1 Tab by mouth daily. 30 Tab 5   ??? chlorthalidone (HYGROTEN) 25 mg tablet Take 1 Tab by mouth daily. 30 Tab 5   ??? metoprolol tartrate (LOPRESSOR) 100 mg IR tablet Take 1 Tab by mouth two (2) times a day. 60 Tab 5   ??? SEROQUEL XR 150 mg sr tablet Take 1 Tab by mouth nightly. 30 Tab 5   ??? hydrALAZINE (APRESOLINE) 50 mg tablet          REVIEW OF SYSTEM:  Review of Systems   Constitutional: Negative.    HENT: Negative for hearing loss.    Eyes: Negative for blurred vision and double vision.   Respiratory: Negative for cough, shortness of breath and wheezing.    Cardiovascular: Negative for chest pain, palpitations and PND.   Gastrointestinal: Negative for abdominal pain, heartburn, nausea and vomiting.   Genitourinary: Negative.    Musculoskeletal: Positive for back pain, falls and joint pain. Negative for myalgias and neck pain.   Neurological: Positive for headaches. Negative for dizziness, tingling and sensory change.       PHYSICAL EXAM:  Visit Vitals   ??? BP (!) 186/137 (BP 1 Location: Right arm, BP Patient Position: Sitting)   ??? Pulse 84   ??? Resp 14   ??? Ht  (1.702 m)   ??? Wt 186 lb (84.4 kg)   ??? BMI 29.13 kg/m2       Pain Assessment  04/07/2015   Location of Pain Hip   Location Modifiers Right   Severity of Pain 6   Quality of Pain Aching;Dull   Duration of Pain Persistent   Frequency of Pain Constant   Date Pain First Started -   Aggravating Factors Bending;Stretching;Straightening;Exercise;Kneeling;Squatting;Standing;Walking;Stairs   Limiting Behavior Yes   Relieving Factors Ice;Rest;NSAID   Relieving Factors Comment -   Result of Injury No   Work-Related Injury -   Type of Injury -       Psychiatric: Affect and mood are appropriate.   Physical Exam   Constitutional: She is oriented to person, place, and time and  well-developed, well-nourished, and in no distress.   Musculoskeletal: She exhibits edema and tenderness.        Right hip: She exhibits decreased range of motion and tenderness. She exhibits no swelling, no crepitus, no deformity and no laceration.        Lumbar back: She exhibits decreased range of motion, tenderness (right-sided lower back tenderness), bony tenderness and pain. She exhibits no swelling, no edema, no deformity, no laceration, no spasm and normal pulse.   Neurological: She is oriented to person, place, and time. No sensory deficit. She exhibits normal muscle tone.   Skin: Skin is warm and dry.   Psychiatric: Affect and judgment normal.   Vitals reviewed.    Ortho Exam      RADIOGRAPHS/X-RAYS:  XR BI HIPS (02/19/2015)  IMPRESSION:  1. Total right hip prosthesis in good position.  2. Mild degeneration of the left hip.    PLAN & RECOMMENDATION:    ICD-10-CM ICD-9-CM    1. Primary osteoarthritis of both hips M16.0 715.15    2. Status post right hip replacement Z96.641 V43.64    3. Acute right-sided low back pain, with sciatica presence unspecified M54.5 724.2        Extensive discussion was  held with the patient regarding the diagnosis, various  treatment options, recovery period, and possible complications including the following  1. Patient's blood pressure was elevated at today's office visit. She is also complaining of a headache x 2 days. Patient will be seen by walk-in clinic in the building today regarding her elevated blood pressure.  2. Prescribed Voltaren 75 mg BID. Refilled Percocet 7.5-325 mg for pain.   3. Pt advised to use a shower chair to try to prevent future falls in the bathrooms.   4. Follow up in 6 weeks.     DISSCUSSION:  The patient was counseled on personal impression, his/her instructions for follow-up, the importance of compliance with use and treatment options and risks factor reduction.    Ms. Belzer has a reminder for a "due or due soon" health maintenance. I  have asked that she contact her primary care provider for follow-up on this health maintenance.    I have discussed the findings of this evaluation with the patient. The discussion included a completed verbal explanation any of changes into examination results,diagnoses and current treatment plan. A scheduled for future care needs was explained. The patient verbalized understanding of these instructions. The patient acknowledged understanding the diagnoses and treatment. If any questions should arise after returning home I have encouraged the patient to feel free to call the office at [phone].The patient understands to call 911 in the case of a true medical emergency.    The patient understands it is their responsibility of the patient to complete all prescribed medications all recommend that testing, including but not limited to, laboratory studies and imaging. The patient further understands the need to keep all scheduled  follow-up visits and to inform the office immediately for any changes in the their medical condition.  The  Patient understands that the success of treatment in large part depends on patient willingness to complete the therapeutic regiment and to work in partnership with designated health care providers.     Patient was reassured; the diagnosis was discussed and all questions were answered to the satisfaction of the patient.      Scribed by Sherryl Barters (Scribekick) as dictated by Madie Reno, MD.  [facility]

## 2015-04-08 ENCOUNTER — Emergency Department: Admit: 2015-04-08 | Payer: MEDICARE | Primary: Physician Assistant

## 2015-04-08 ENCOUNTER — Inpatient Hospital Stay
Admit: 2015-04-08 | Discharge: 2015-04-10 | Disposition: A | Discharge status: Home / Self Care | Payer: MEDICARE | Attending: Internal Medicine | Admitting: Internal Medicine

## 2015-04-08 LAB — CBC WITH AUTOMATED DIFF
BASOPHILS: 0.3 % (ref 0–3)
EOSINOPHILS: 0.9 % (ref 0–5)
HCT: 46.1 % (ref 37.0–50.0)
HGB: 15.6 gm/dl (ref 13.0–17.2)
IMMATURE GRANULOCYTES: 0.2 % (ref 0.0–3.0)
LYMPHOCYTES: 22.7 % — ABNORMAL LOW (ref 28–48)
MCH: 28.1 pg (ref 25.4–34.6)
MCHC: 33.8 gm/dl (ref 30.0–36.0)
MCV: 83.1 fL (ref 80.0–98.0)
MONOCYTES: 6.1 % (ref 1–13)
MPV: 11.6 fL — ABNORMAL HIGH (ref 6.0–10.0)
NEUTROPHILS: 69.8 % — ABNORMAL HIGH (ref 34–64)
NRBC: 0 (ref 0–0)
PLATELET: 224 10*3/uL (ref 140–450)
RBC: 5.55 M/uL — ABNORMAL HIGH (ref 3.60–5.20)
RDW-SD: 39.8 (ref 36.4–46.3)
WBC: 12.8 10*3/uL — ABNORMAL HIGH (ref 4.0–11.0)

## 2015-04-08 LAB — METABOLIC PANEL, COMPREHENSIVE
ALT (SGPT): 12 U/L (ref 12–78)
AST (SGOT): 12 U/L — ABNORMAL LOW (ref 15–37)
Albumin: 3.7 gm/dl (ref 3.4–5.0)
Alk. phosphatase: 99 U/L (ref 45–117)
BUN: 11 mg/dl (ref 7–25)
Bilirubin, total: 0.8 mg/dl (ref 0.2–1.0)
CO2: 26 mEq/L (ref 21–32)
Calcium: 9.5 mg/dl (ref 8.5–10.1)
Chloride: 101 mEq/L (ref 98–107)
Creatinine: 0.6 mg/dl (ref 0.6–1.3)
GFR est AA: >60
GFR est non-AA: >60
Glucose: 230 mg/dl — ABNORMAL HIGH (ref 74–106)
Potassium: 3.1 mEq/L — ABNORMAL LOW (ref 3.5–5.1)
Protein, total: 8.9 gm/dl — ABNORMAL HIGH (ref 6.4–8.2)
Sodium: 137 mEq/L (ref 136–145)

## 2015-04-08 LAB — POC URINE MACROSCOPIC
Bilirubin: NEGATIVE
Blood: NEGATIVE
Glucose: NEGATIVE mg/dl
Leukocyte Esterase: NEGATIVE
Nitrites: NEGATIVE
Protein: 30 mg/dl — AB
Specific gravity: 1.025 (ref 1.005–1.030)
Urobilinogen: 0.2 EU/dl (ref 0.0–1.0)
pH (UA): 7 (ref 5–9)

## 2015-04-08 LAB — DRUG SCREEN, URINE
Amphetamine: NEGATIVE
Barbiturates: NEGATIVE
Benzodiazepines: NEGATIVE
Cocaine: NEGATIVE
Marijuana: POSITIVE — AB
Methadone: NEGATIVE
Opiates: POSITIVE — AB
Phencyclidine: NEGATIVE

## 2015-04-08 LAB — TROPONIN I
Troponin-I: <0.015 ng/ml (ref 0.000–0.045)
Troponin-I: <0.015 ng/ml (ref 0.000–0.045)
Troponin-I: <0.015 ng/ml (ref 0.000–0.045)

## 2015-04-08 LAB — MAGNESIUM: Magnesium: 1.8 mg/dl (ref 1.8–2.4)

## 2015-04-08 MED ORDER — DIPHENHYDRAMINE HCL 50 MG/ML IJ SOLN
50 mg/mL | INTRAMUSCULAR | Status: AC
Start: 2015-04-08 — End: 2015-04-07
  Administered 2015-04-08: 03:00:00 via INTRAVENOUS

## 2015-04-08 MED ORDER — ACETAMINOPHEN 650 MG RECTAL SUPPOSITORY
650 mg | RECTAL | Status: DC | PRN
Start: 2015-04-08 — End: 2015-04-10

## 2015-04-08 MED ORDER — QUETIAPINE SR 50 MG 24 HR TAB
50 mg | Freq: Every evening | ORAL | Status: DC
Start: 2015-04-08 — End: 2015-04-10
  Administered 2015-04-09 – 2015-04-10 (×2): via ORAL

## 2015-04-08 MED ORDER — HYDRALAZINE 20 MG/ML IJ SOLN
20 mg/mL | INTRAMUSCULAR | Status: DC
Start: 2015-04-08 — End: 2015-04-08

## 2015-04-08 MED ORDER — LORAZEPAM 2 MG/ML IJ SOLN
2 mg/mL | Freq: Once | INTRAMUSCULAR | Status: AC
Start: 2015-04-08 — End: 2015-04-08
  Administered 2015-04-08: 04:00:00 via INTRAVENOUS

## 2015-04-08 MED ORDER — ESMOLOL IN SALINE (ISO-OSMOTIC) 10 MG/ML IV
2500 mg/250 mL (10 mg/mL) | INTRAVENOUS | Status: DC
Start: 2015-04-08 — End: 2015-04-08
  Administered 2015-04-08 (×3): via INTRAVENOUS

## 2015-04-08 MED ORDER — MORPHINE 2 MG/ML INJECTION
2 mg/mL | INTRAMUSCULAR | Status: DC | PRN
Start: 2015-04-08 — End: 2015-04-10
  Administered 2015-04-08 – 2015-04-10 (×9): via INTRAVENOUS

## 2015-04-08 MED ORDER — CHLORTHALIDONE 25 MG TAB
25 mg | Freq: Every day | ORAL | Status: DC
Start: 2015-04-08 — End: 2015-04-10
  Administered 2015-04-08 – 2015-04-10 (×3): via ORAL

## 2015-04-08 MED ORDER — CHLORTHALIDONE 25 MG TAB
25 mg | Freq: Every day | ORAL | Status: DC
Start: 2015-04-08 — End: 2015-04-08

## 2015-04-08 MED ORDER — ONDANSETRON (PF) 4 MG/2 ML INJECTION
4 mg/2 mL | INTRAMUSCULAR | Status: DC | PRN
Start: 2015-04-08 — End: 2015-04-10

## 2015-04-08 MED ORDER — SODIUM CHLORIDE 0.9 % IJ SYRG
INTRAMUSCULAR | Status: AC
Start: 2015-04-08 — End: 2015-04-08
  Administered 2015-04-08: 13:00:00

## 2015-04-08 MED ORDER — PROCHLORPERAZINE EDISYLATE 5 MG/ML INJECTION
5 mg/mL | INTRAMUSCULAR | Status: AC
Start: 2015-04-08 — End: 2015-04-07
  Administered 2015-04-08: 03:00:00 via INTRAVENOUS

## 2015-04-08 MED ORDER — AMLODIPINE 5 MG TAB
5 mg | Freq: Every day | ORAL | Status: DC
Start: 2015-04-08 — End: 2015-04-10
  Administered 2015-04-08 – 2015-04-10 (×3): via ORAL

## 2015-04-08 MED ORDER — HYDRALAZINE 20 MG/ML IJ SOLN
20 mg/mL | Freq: Once | INTRAMUSCULAR | Status: AC
Start: 2015-04-08 — End: 2015-04-07
  Administered 2015-04-08: 03:00:00 via INTRAVENOUS

## 2015-04-08 MED ORDER — HYDRALAZINE 20 MG/ML IJ SOLN
20 mg/mL | INTRAMUSCULAR | Status: AC
Start: 2015-04-08 — End: 2015-04-08
  Administered 2015-04-08: 06:00:00 via INTRAVENOUS

## 2015-04-08 MED ORDER — GABAPENTIN 300 MG CAP
300 mg | Freq: Three times a day (TID) | ORAL | Status: DC
Start: 2015-04-08 — End: 2015-04-10
  Administered 2015-04-08 – 2015-04-10 (×6): via ORAL

## 2015-04-08 MED ORDER — DICLOFENAC 75 MG TAB, DELAYED RELEASE
75 mg | Freq: Two times a day (BID) | ORAL | Status: DC
Start: 2015-04-08 — End: 2015-04-10
  Administered 2015-04-08 – 2015-04-10 (×4): via ORAL

## 2015-04-08 MED ORDER — ONDANSETRON (PF) 4 MG/2 ML INJECTION
4 mg/2 mL | INTRAMUSCULAR | Status: DC | PRN
Start: 2015-04-08 — End: 2015-04-09

## 2015-04-08 MED ORDER — ACETAMINOPHEN 325 MG TABLET
325 mg | ORAL | Status: DC | PRN
Start: 2015-04-08 — End: 2015-04-10

## 2015-04-08 MED ORDER — SODIUM CHLORIDE 0.9 % IJ SYRG
Freq: Once | INTRAMUSCULAR | Status: AC
Start: 2015-04-08 — End: 2015-04-07
  Administered 2015-04-08: 03:00:00 via INTRAVENOUS

## 2015-04-08 MED ORDER — AMLODIPINE 5 MG TAB
5 mg | Freq: Every day | ORAL | Status: DC
Start: 2015-04-08 — End: 2015-04-08

## 2015-04-08 MED ORDER — ENOXAPARIN 40 MG/0.4 ML SUB-Q SYRINGE
40 mg/0.4 mL | SUBCUTANEOUS | Status: DC
Start: 2015-04-08 — End: 2015-04-10
  Administered 2015-04-09: 22:00:00 via SUBCUTANEOUS

## 2015-04-08 MED ORDER — CLONIDINE 0.1 MG TAB
0.1 mg | Freq: Two times a day (BID) | ORAL | Status: DC
Start: 2015-04-08 — End: 2015-04-10
  Administered 2015-04-08 – 2015-04-10 (×4): via ORAL

## 2015-04-08 MED ORDER — POTASSIUM CHLORIDE 10 % ORAL LIQUID
20 mEq/15 mL | ORAL | Status: AC
Start: 2015-04-08 — End: 2015-04-08
  Administered 2015-04-08: 17:00:00 via ORAL

## 2015-04-08 MED ORDER — ACETAMINOPHEN (TYLENOL) SOLUTION 32MG/ML
ORAL | Status: DC | PRN
Start: 2015-04-08 — End: 2015-04-10

## 2015-04-08 MED ORDER — NALOXONE 0.4 MG/ML INJECTION
0.4 mg/mL | INTRAMUSCULAR | Status: DC | PRN
Start: 2015-04-08 — End: 2015-04-10

## 2015-04-08 MED ORDER — METOPROLOL TARTRATE 100 MG TAB
100 mg | Freq: Two times a day (BID) | ORAL | Status: DC
Start: 2015-04-08 — End: 2015-04-09
  Administered 2015-04-08 – 2015-04-09 (×2): via ORAL

## 2015-04-08 MED ORDER — NICARDIPINE IN SODIUM CHLORIDE(ISO-OSMOTIC) 20 MG/200 ML IV PIGGY BACK
INTRAVENOUS | Status: DC
Start: 2015-04-08 — End: 2015-04-08
  Administered 2015-04-08 (×2): via INTRAVENOUS

## 2015-04-08 MED ORDER — DEXAMETHASONE SODIUM PHOSPHATE 10 MG/ML IJ SOLN
10 mg/mL | INTRAMUSCULAR | Status: AC
Start: 2015-04-08 — End: 2015-04-08
  Administered 2015-04-08: 04:00:00 via INTRAVENOUS

## 2015-04-08 MED ORDER — METOPROLOL TARTRATE 5 MG/5 ML IV SOLN
5 mg/ mL | INTRAVENOUS | Status: AC
Start: 2015-04-08 — End: 2015-04-08
  Administered 2015-04-08 (×3): via INTRAVENOUS

## 2015-04-08 MED ORDER — DIPHENHYDRAMINE HCL 50 MG/ML IJ SOLN
50 mg/mL | INTRAMUSCULAR | Status: DC | PRN
Start: 2015-04-08 — End: 2015-04-10

## 2015-04-08 MED ORDER — HYDRALAZINE 50 MG TAB
50 mg | Freq: Three times a day (TID) | ORAL | Status: DC
Start: 2015-04-08 — End: 2015-04-09
  Administered 2015-04-08 – 2015-04-09 (×3): via ORAL

## 2015-04-08 MED ORDER — NICARDIPINE IN SODIUM CHLORIDE(ISO-OSMOTIC) 20 MG/200 ML IV PIGGY BACK
INTRAVENOUS | Status: DC
Start: 2015-04-08 — End: 2015-04-09
  Administered 2015-04-08 – 2015-04-09 (×9): via INTRAVENOUS

## 2015-04-08 MED ORDER — OXYCODONE-ACETAMINOPHEN 10 MG-325 MG TAB
10-325 mg | ORAL | Status: DC | PRN
Start: 2015-04-08 — End: 2015-04-10
  Administered 2015-04-08 – 2015-04-10 (×4): via ORAL

## 2015-04-08 MED ORDER — MORPHINE 4 MG/ML SYRINGE
4 mg/mL | INTRAMUSCULAR | Status: AC
Start: 2015-04-08 — End: 2015-04-08
  Administered 2015-04-08: 09:00:00 via INTRAVENOUS

## 2015-04-08 MED ORDER — SODIUM CHLORIDE 0.9% BOLUS IV
0.9 % | INTRAVENOUS | Status: AC
Start: 2015-04-08 — End: 2015-04-07
  Administered 2015-04-08: 03:00:00 via INTRAVENOUS

## 2015-04-08 MED FILL — MORPHINE 4 MG/ML SYRINGE: 4 mg/mL | INTRAMUSCULAR | Qty: 1

## 2015-04-08 MED FILL — CHLORTHALIDONE 25 MG TAB: 25 mg | ORAL | Qty: 1

## 2015-04-08 MED FILL — MORPHINE 2 MG/ML INJECTION: 2 mg/mL | INTRAMUSCULAR | Qty: 1

## 2015-04-08 MED FILL — LORAZEPAM 2 MG/ML IJ SOLN: 2 mg/mL | INTRAMUSCULAR | Qty: 1

## 2015-04-08 MED FILL — CARDENE 20 MG/200 ML(0.1 MG/ML) IN SOD CHLOR(ISO-OSM) INTRAVENOUS SOLN: 20 mg/0 mL (0.1 mg/mL) | INTRAVENOUS | Qty: 200

## 2015-04-08 MED FILL — BREVIBLOC 2,500 MG/250 ML (10 MG/ML) IN SODIUM CHLORIDE (ISO-OSM) IV: 2500 mg/250 mL (10 mg/mL) | INTRAVENOUS | Qty: 250

## 2015-04-08 MED FILL — LOVENOX 40 MG/0.4 ML SUBCUTANEOUS SYRINGE: 40 mg/0.4 mL | SUBCUTANEOUS | Qty: 0.4

## 2015-04-08 MED FILL — AMLODIPINE 5 MG TAB: 5 mg | ORAL | Qty: 2

## 2015-04-08 MED FILL — POTASSIUM CHLORIDE 10 % ORAL LIQUID: 20 mEq/15 mL | ORAL | Qty: 45

## 2015-04-08 MED FILL — OXYCODONE-ACETAMINOPHEN 10 MG-325 MG TAB: 10-325 mg | ORAL | Qty: 1

## 2015-04-08 MED FILL — DEXAMETHASONE SODIUM PHOSPHATE 10 MG/ML IJ SOLN: 10 mg/mL | INTRAMUSCULAR | Qty: 1

## 2015-04-08 MED FILL — HYDRALAZINE 20 MG/ML IJ SOLN: 20 mg/mL | INTRAMUSCULAR | Qty: 1

## 2015-04-08 MED FILL — METOPROLOL TARTRATE 5 MG/5 ML IV SOLN: 5 mg/ mL | INTRAVENOUS | Qty: 10

## 2015-04-08 MED FILL — PROCHLORPERAZINE EDISYLATE 5 MG/ML INJECTION: 5 mg/mL | INTRAMUSCULAR | Qty: 2

## 2015-04-08 MED FILL — METOPROLOL TARTRATE 5 MG/5 ML IV SOLN: 5 mg/ mL | INTRAVENOUS | Qty: 5

## 2015-04-08 MED FILL — HYDRALAZINE 50 MG TAB: 50 mg | ORAL | Qty: 1

## 2015-04-08 MED FILL — GABAPENTIN 300 MG CAP: 300 mg | ORAL | Qty: 1

## 2015-04-08 MED FILL — METOPROLOL TARTRATE 100 MG TAB: 100 mg | ORAL | Qty: 1

## 2015-04-08 MED FILL — BD POSIFLUSH NORMAL SALINE 0.9 % INJECTION SYRINGE: INTRAMUSCULAR | Qty: 10

## 2015-04-08 MED FILL — DIPHENHYDRAMINE HCL 50 MG/ML IJ SOLN: 50 mg/mL | INTRAMUSCULAR | Qty: 1

## 2015-04-08 MED FILL — CLONIDINE 0.1 MG TAB: 0.1 mg | ORAL | Qty: 1

## 2015-04-08 NOTE — Other (Shared)
Took a call from Dr Noel Geroldohen, the patient had a brain abnormality on her radiology report. She is a inpatient being roomed in the ER. I relayed the information to her nurse Judeth CornfieldStephanie. Judeth CornfieldStephanie said she just saw it when reviewing the pts testing. There is a page out to Dr Robin SearingMacGregor.

## 2015-04-08 NOTE — Telephone Encounter (Signed)
Office was calling to inquire about the Toradol injections patient has received in the past.

## 2015-04-08 NOTE — ED Provider Notes (Signed)
[place]  Emergency Department Treatment Report    Patient: [name] [name] Age: 64 y.o. Sex: female    Date of Birth: [birth year] Admit Date: 04/07/2015 PCP: Lake Bells, MD   MRN: [id number]  CSN: [id number]     Room: [phone]           Chief Complaint      Chief Complaint   Patient presents with   ??? Headache   ??? Hypertension       History of Present Illness   64 y.o. female c/o headache. Describes pain to be frontal aching,10 /10 gradual onset. Patient woke up with the pain yesterday morning. Has never had headaches like this in the past. Complains of photophobia and nausea and vomiting. No neck pain. Patient was at her orthopedist office today and was found to be severely hypertensive and was referred here.  Denies loss of vision, coordination. Denies dizziness, numbness, tingling, weakness, difficulty walking or talking. Denies neck pain, fever, chills.     Review of Systems     Constitutional:  Denies malaise, fever, chills.   Head:  Denies injury.   Face:  No injury or pain.   ENMT:  No congestion, difficulty swallowing, sore throat.   Neck:  No injury or pain.   Chest:  No injury.   Cardiac:  Denies chest pain, edema, or palpitations.   Respiratory:  Denies cough, wheezing, difficulty breathing, shortness of breath.   GI/ABD:  Denies pain, distention, nausea, vomiting, diarrhea.   GU:  Denies injury, pain, dysuria, urgency, frequency, or discharge.   Back: No injury or pain.   Extremity/MS:  No injury or pain, swelling or loss of motion.   Neuro:  Denies headache, LOC, dizziness, paresthesias, dysarthria, ataxia, tinnitus, dysphagia, or syncope    Skin: Denies injury, rash, itching, ecchymosis or skin changes.     Past Medical/Surgical History     Past Medical History   Diagnosis Date   ??? Arthritis    ??? Chronic pain    ??? GERD (gastroesophageal reflux disease)    ??? Hypertension    ??? Liver disease      HEP C   ??? PTSD (post-traumatic stress disorder)       lived through the [place] in 1993     Past Surgical History   Procedure Laterality Date   ??? Hx gyn  2010     hysterectomy   ??? Hx endoscopy     ??? 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 Hx hip replacement         Social History     Social History     Social History   ??? Marital status: MARRIED     Spouse name: N/A   ??? Number of children: N/A   ??? Years of education: N/A     Social History Main Topics   ??? Smoking status: Never Smoker   ??? Smokeless tobacco: Never Used   ??? Alcohol use No   ??? Drug use: No   ??? Sexual activity: No     Other Topics Concern   ??? Not on file     Social History Narrative       Family History     Family History   Problem Relation Age of Onset   ??? Hypertension Mother    ??? Cancer Maternal Aunt    ??? Alcohol abuse Maternal Grandfather        Current Medications  Current Facility-Administered Medications   Medication Dose Route Frequency Provider Last Rate Last Dose   ??? esmolol 47m/mL (BREVIBLOC) infusion  50 mcg/kg/min IntraVENous TITRATE JSaintclair Halsted DO 25.3 mL/hr at 04/08/15 0333 50 mcg/kg/min at 04/08/15 0333   ??? morphine injection 4 mg  4 mg IntraVENous NOW JSaintclair Halsted DO         Current Outpatient Prescriptions   Medication Sig Dispense Refill   ??? oxyCODONE-acetaminophen (PERCOCET) 5-325 mg per tablet Take 1 Tab by mouth every twelve (12) hours as needed for Pain. Max Daily Amount: 2 Tabs. Indications: PAIN 40 Tab 0   ??? diclofenac EC (VOLTAREN) 75 mg EC tablet Take 1 Tab by mouth two (2) times daily (with meals). 60 Tab 0   ??? oxyCODONE-acetaminophen (PERCOCET) 7.5-325 mg per tablet Take 1 Tab by mouth two (2) times daily as needed for Pain. Max Daily Amount: 2 Tabs. 40 Tab 0   ??? oxyCODONE-acetaminophen (PERCOCET) 7.5-325 mg per tablet Take 1 Tab by mouth every six (6) hours as needed for Pain. Max Daily Amount: 4 Tabs. DO NOT FILL BEFORE 03/11/2015   Indications: PAIN 50 Tab 0   ??? ibuprofen (MOTRIN) 600 mg tablet Take 1 Tab by mouth every six (6) hours  as needed for Pain. 30 Tab 0   ??? gabapentin (NEURONTIN) 300 mg capsule Take 1 Cap by mouth three (3) times daily. 60 Cap 0   ??? cloNIDine HCl (CATAPRES) 0.3 mg tablet Take 0.3 mg by mouth two (2) times a day.     ??? acetaminophen (TYLENOL) 325 mg tablet Take  by mouth every four (4) hours as needed for Pain.     ??? enoxaparin (LOVENOX) 40 mg/0.4 mL 0.4 mL by SubCUTAneous route daily. Indications: DEEP VEIN THROMBOSIS PREVENTION, post total hip 25 Syringe 0   ??? HYDROcodone-acetaminophen (NORCO) 7.5-325 mg per tablet Take 1 Tab by mouth every four (4) hours as needed. Max Daily Amount: 6 Tabs. Indications: PAIN, post total hip 70 Tab 0   ??? LORazepam (ATIVAN) 1 mg tablet Take 1 Tab by mouth nightly. Max Daily Amount: 1 mg. Indications: INSOMNIA 20 Tab 0   ??? amLODIPine (NORVASC) 10 mg tablet Take 1 Tab by mouth daily. 30 Tab 5   ??? chlorthalidone (HYGROTEN) 25 mg tablet Take 1 Tab by mouth daily. 30 Tab 5   ??? metoprolol tartrate (LOPRESSOR) 100 mg IR tablet Take 1 Tab by mouth two (2) times a day. 60 Tab 5   ??? SEROQUEL XR 150 mg sr tablet Take 1 Tab by mouth nightly. 30 Tab 5   ??? hydrALAZINE (APRESOLINE) 50 mg tablet        Allergies   No Known Allergies  Physical Exam     Visit Vitals   ??? BP (!) 184/121   ??? Pulse 97   ??? Temp 98.3 ??F (36.8 ??C)   ??? Resp 16   ??? Ht 5' 7"  (1.702 m)   ??? Wt 84.4 kg (186 lb)   ??? SpO2 97%   ??? BMI 29.13 kg/m2     CONSTITUTIONAL: Well-appearing.  Well-nourished.  In no apparent distress. Patient appears to be in severe pain, sitting in dark room.   HEAD: Normocephalic and atraumatic.  EYES: PERRL.  EOM intact. Visual fields tested and intact bilaterally.  ENT: TM's normal.  Normal nose.  No rhinorrhea.  Normal pharynx with no tonsillar hypertrophy, erythema, or exudate.   Mucus membranes moist.  No stridor.  NECK: Supple with no evidence  of meningismus.  Non-tender along midline with no step-offs.  No cervical lymphadenopathy.  No JVD.   CHEST:  No evidence of trauma or deformity.  Non-tender to palpation.  No crepitus or paradoxical movements.  Chest excursion is normal.  CARDIOVASCULAR: Normal S1, S2; no murmurs, rubs, or gallops.  Radial, femoral, and pedal pulses are present and symmetric. There is brisk capillary refill.  RESPIRATORY: Normal chest excursion with respiration.  Breath sounds clear and equal bilaterally.  No wheezes, rhonchi, or rales.  GI: Bowel sounds present. Non-distended.  Non-tender to deep palpation throughout abdomen.  No guarding.  Negative Murphy???s sign.  No palpable organomegaly. There are no abdominal bruits.  There is no palpable pulsatile abdominal mass.  GU: normal-appearing, non-tender external genitalia.  BACK:  No evidence of trauma, pain or deformity.  Non-tender to palpation along entirety of midline.  No CVA tenderness.  PELVIS:  No evidence of trauma or deformity.  EXT: There are no deformities noted in all four extremities.  There is full ROM in all extremity joints.  There is no bony or joint tenderness to palpation.  Pulses are equal bilaterally. No calf tenderness, erythema, or size asymmetry  SKIN: Normal for age and race; warm; dry; good turgor; no apparent lesions or exudate.  NEURO: A & O x 4; grossly unremarkable.  Cranial nerves normal. Motor 5/5 bilaterally. Sensation to light touch intact in all extremities.  DTR equal and reactive bilaterally.  Cerebellar testing (finger to nose, heel to shin) normal.  Gait normal.   PSYCHOLOGICAL:  The patient???s mood and manner are appropriate.  Appropriate grooming and personal hygiene.  No apparent thoughts of harm to self or others.      Impression and Management Plan   Nursing notes were reviewed. Past medical history was reviewed. Discussed with Dr. Jerelyn Charles. Patient given IV compazine and benadryl for initial interventions will monitor. Considering CT to rule out edema hemorrhage.       Diagnostic Studies   Lab:   Recent Results (from the past 12 hour(s))    CBC WITH AUTOMATED DIFF    Collection Time: 04/08/15  2:12 AM   Result Value Ref Range    WBC 12.8 (H) 4.0 - 11.0 1000/mm3    RBC 5.55 (H) 3.60 - 5.20 M/uL    HGB 15.6 13.0 - 17.2 gm/dl    HCT 46.1 37.0 - 50.0 %    MCV 83.1 80.0 - 98.0 fL    MCH 28.1 25.4 - 34.6 pg    MCHC 33.8 30.0 - 36.0 gm/dl    PLATELET 224 140 - 450 1000/mm3    MPV 11.6 (H) 6.0 - 10.0 fL    RDW-SD 39.8 36.4 - 46.3      NRBC 0 0 - 0      IMMATURE GRANULOCYTES 0.2 0.0 - 3.0 %    NEUTROPHILS 69.8 (H) 34 - 64 %    LYMPHOCYTES 22.7 (L) 28 - 48 %    MONOCYTES 6.1 1 - 13 %    EOSINOPHILS 0.9 0 - 5 %    BASOPHILS 0.3 0 - 3 %   TROPONIN I    Collection Time: 04/08/15  2:12 AM   Result Value Ref Range    Troponin-I <0.015 0.000 - 0.045 ng/ml   DRUG SCREEN, URINE    Collection Time: 04/08/15  2:12 AM   Result Value Ref Range    Amphetamine NEGATIVE NEGATIVE      Barbiturates NEGATIVE NEGATIVE  Benzodiazepines NEGATIVE NEGATIVE      Cocaine NEGATIVE NEGATIVE      Marijuana POSITIVE (A) NEGATIVE      Methadone NEGATIVE NEGATIVE      Opiates POSITIVE (A) NEGATIVE      Phencyclidine NEGATIVE NEGATIVE     POC URINE MACROSCOPIC    Collection Time: 04/08/15  2:12 AM   Result Value Ref Range    Glucose Negative NEGATIVE,Negative mg/dl    Bilirubin Negative NEGATIVE,Negative      Ketone Trace (A) NEGATIVE,Negative mg/dl    Specific gravity 1.025 1.005 - 1.030      Blood Negative NEGATIVE,Negative      pH (UA) 7.0 5 - 9      Protein 30 (A) NEGATIVE,Negative mg/dl    Urobilinogen 0.2 0.0 - 1.0 EU/dl    Nitrites Negative NEGATIVE,Negative      Leukocyte Esterase Negative NEGATIVE,Negative      Color Yellow      Appearance Clear     METABOLIC PANEL, COMPREHENSIVE    Collection Time: 04/08/15  3:18 AM   Result Value Ref Range    Sodium 137 136 - 145 mEq/L    Potassium 3.1 (L) 3.5 - 5.1 mEq/L    Chloride 101 98 - 107 mEq/L    CO2 26 21 - 32 mEq/L    Glucose 230 (H) 74 - 106 mg/dl    BUN 11 7 - 25 mg/dl    Creatinine 0.6 0.6 - 1.3 mg/dl    GFR est AA >60.0       GFR est non-AA >60      Calcium 9.5 8.5 - 10.1 mg/dl    AST 12 (L) 15 - 37 U/L    ALT 12 12 - 78 U/L    Alk. phosphatase 99 45 - 117 U/L    Bilirubin, total 0.8 0.2 - 1.0 mg/dl    Protein, total 8.9 (H) 6.4 - 8.2 gm/dl    Albumin 3.7 3.4 - 5.0 gm/dl   MAGNESIUM    Collection Time: 04/08/15  3:18 AM   Result Value Ref Range    Magnesium 1.8 1.8 - 2.4 mg/dl     CT head  No acute intercranial disease, no bleed, edema       ED Course   Patient seen and examined initially 0900.   23:30 patient was reassessed still having pain. Still hypertensive. CT of the head was ordered. Patient given IV Decadron and Ativan.  01:00 CT the head negative. Additional labs drawn patient given a second dose of hydralazine still hypertensive.  02:00 given metoprolol drip for hypertensive urgency. Plan is to admit the patient at this point.      Medical Decision Making   Continuation by Dr. Dennie Bible    I hereby certify this patient for admission based upon medical necessity as ??  noted below:    The patient was seen and treated on 04/07/15; initially evaluated by Barbette Reichmann, PA-C.  The patient's CT scan of the brain was negative.  Her blood pressure starting out at 198/123 with a goal pressure 148/92.  This was not alleviated by medications given for migrainous cephalgia which is what her presentation seemed to be.  The patient does not really have a history of headaches like this.  She has a history of blood pressure and is taking her meds but she has never really blood pressure this high.  The course was to see whether or not blood pressure would be ameliorated by pain control but  neither has actually happened with the first dose.  She was given 10 mg hydralazine with no effect after 45 minutes.  She was given labetalol 5 q.5 times 3 with no effect.  She was placed on an esmolol drip for 45 minutes to an hour with no effect.  She was eventually switched to nicardipine.  She continued to have pressures 176/114.  Heart rate 102.   On assessment, she continues to have significant cephalgia.  The patient's care was discussed with pulmonary critical care, Dr. Flossie Dibble and also with Dr. Meredeth Ide from the [facility] group who will be placing her on their service for continued care.  She is going to ICU.    Critical care time, excluding procedures, but including direct patient care, reviewing medical records, evaluating results of diagnostic testing, discussions with family members, and consulting with physicians:  45 minutes.    Final Diagnosis   1. Accelerated hypertension  2. [place]     Disposition   Admission ICU treatment in progress       Patient was seen and examined by Alvino Blood, PA-C under the supervision of Himmel Salch  April 08, 2015    My signature above authenticates this document and my orders, the final ??  diagnosis (es), discharge prescription (s), and instructions in the Epic ??  record.  If you have any questions please contact [phone].  ??

## 2015-04-08 NOTE — H&P (Signed)
Medicine History and Physical    Patient: Anne ShutterMonica R Stauch   Age:  64 y.o.    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   -MRI with contrast ordered, may need NS evaluation     DISPO  -Pt to be admitted  at this time for reasons addressed above, continued hospitalization for ongoing assessment and treatment indicated     Anticipated Date of Discharge: *1-2d  Anticipated Disposition (home, SNF) : home    Chief Complaint:   Chief Complaint   Patient presents with   ??? Headache   ??? Hypertension         HPI:   Anne ShutterMonica R Penrod is a 64 y.o. year old female who presents with  Headache has been present for 2 days , frontal sharp / throbbing severe w/ radiation , no reliving provoking factors. Pt went to orthopedic apt and BP measured and was elevated > 180SYS  And she was told to come to ED. Pt admitts she is poorly compliant with medications ( on 5 BP meds) and has missed doses in last few days. whe seen in the Ed BP in 150 on cardiene drip HA starting to improve       Review of Systems - positive responses in bold type   Constitutional: Negative for fever, chills, diaphoresis and unexpected weight change.   HENT: Negative for ear pain, congestion, sore throat, rhinorrhea, drooling, trouble swallowing, neck pain and tinnitus.   Eyes: Negative for photophobia, pain, redness and visual disturbance.   Respiratory: negative for shortness of breath, cough, choking, chest tightness, wheezing or stridor.   Cardiovascular: Negative for chest pain, palpitations and leg swelling.   Gastrointestinal: Negative for nausea, vomiting, abdominal pain, diarrhea, constipation, blood in stool, abdominal distention and anal bleeding.   Genitourinary: Negative for dysuria, urgency, frequency, hematuria, flank  pain and difficulty urinating.   Musculoskeletal: Negative for back pain and arthralgias.   Skin: Negative for color change, rash and wound.   Neurological:  headaches.Negative for dizziness, seizures, syncope, speech difficulty, light-headedness or   Hematological: Does not bruise/bleed easily.   Psychiatric/Behavioral: Negative for suicidal ideas, hallucinations, behavioral problems, self-injury or agitation       Past Medical History:  Past Medical History   Diagnosis Date   ??? Arthritis    ??? Chronic pain    ??? GERD (gastroesophageal reflux disease)    ??? Hypertension    ??? Liver disease      HEP C   ??? PTSD (post-traumatic stress disorder)      lived through [place]       Past Surgical History:  Past Surgical History   Procedure Laterality Date   ??? Hx gyn  2010     hysterectomy   ??? Hx endoscopy     ??? 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 Hx hip replacement         Family History:  Family History   Problem Relation Age of Onset   ??? Hypertension Mother    ??? Cancer Maternal Aunt    ??? Alcohol abuse Maternal Grandfather        Social History:  Social History  Social History   ??? Marital status: MARRIED     Spouse name: N/A   ??? Number of children: N/A   ??? Years of education: N/A     Social History Main Topics   ??? Smoking status: Never Smoker   ??? Smokeless tobacco: Never Used   ??? Alcohol use No   ??? Drug use: No   ??? Sexual activity: No     Other Topics Concern   ??? Not on file     Social History Narrative       Home Medications:  Prior to Admission medications    Medication Sig Start Date End Date Taking? Authorizing Provider   hydrALAZINE (APRESOLINE) 100 mg tablet Take 100 mg by mouth four (4) times daily.   Yes Phys Other, MD   oxyCODONE-acetaminophen (PERCOCET) 5-325 mg per tablet Take 1 Tab by mouth every twelve (12) hours as needed for Pain. Max Daily Amount: 2 Tabs. Indications: PAIN 04/07/15  Yes Madie Reno, MD    diclofenac EC (VOLTAREN) 75 mg EC tablet Take 1 Tab by mouth two (2) times daily (with meals). 04/07/15  Yes Madie Reno, MD   gabapentin (NEURONTIN) 300 mg capsule Take 1 Cap by mouth three (3) times daily. 03/07/15  Yes Johny Drilling, MD   acetaminophen (TYLENOL) 325 mg tablet Take 650 mg by mouth every four (4) hours as needed for Pain.   Yes Historical Provider   amLODIPine (NORVASC) 10 mg tablet Take 1 Tab by mouth daily. 12/10/14  Yes Jolson Mollie Germany, MD   chlorthalidone (HYGROTEN) 25 mg tablet Take 1 Tab by mouth daily. 12/10/14  Yes Jolson Mollie Germany, MD   metoprolol tartrate (LOPRESSOR) 100 mg IR tablet Take 1 Tab by mouth two (2) times a day. 12/10/14  Yes Jolson Mollie Germany, MD   SEROQUEL XR 150 mg sr tablet Take 1 Tab by mouth nightly. 12/02/14  Yes Jolson Mollie Germany, MD       Allergies:  No Known Allergies        Physical Exam:     Visit Vitals   ??? BP (!) 162/117   ??? Pulse (!) 124   ??? Temp 99.1 ??F (37.3 ??C)   ??? Resp 23   ??? Ht  (1.702 m)   ??? Wt 84.4 kg (186 lb)   ??? SpO2 98%   ??? BMI 29.13 kg/m2       Physical Exam:  General appearance: alert, cooperative, no distress, appears stated age  Head: Normocephalic, without obvious abnormality, atraumatic  Neck: supple, trachea midline  Lungs: clear to auscultation bilaterally  Heart: regular rate and rhythm, S1, S2 normal, no murmur, click, rub or gallop  Abdomen: soft, non-tender. Bowel sounds normal. No masses,  no organomegaly  Extremities: extremities normal, atraumatic, no cyanosis or edema  Skin: Skin color, texture, turgor normal. No rashes or lesions  Neurologic: Grossly normal  Lymph: no palpable LAD in cervical or axillary region  PSY: mood and affect normal, appropriately behaved     Intake and Output:  Current Shift:  10/26 0701 - 10/26 1900  In: 170 [I.V.:170]  Out: -   Last three shifts:       Lab/Data Reviewed:  All lab results for the last 24 hours reviewed.          Kristie Cowman, MD  P# [phone]  April 08, 2015

## 2015-04-09 ENCOUNTER — Inpatient Hospital Stay: Admit: 2015-04-09 | Payer: MEDICARE | Primary: Physician Assistant

## 2015-04-09 LAB — CBC WITH AUTOMATED DIFF
BASOPHILS: 0.2 % (ref 0–3)
EOSINOPHILS: 0.3 % (ref 0–5)
HCT: 37.5 % (ref 37.0–50.0)
HGB: 12.6 gm/dl — ABNORMAL LOW (ref 13.0–17.2)
IMMATURE GRANULOCYTES: 0.3 % (ref 0.0–3.0)
LYMPHOCYTES: 24.3 % — ABNORMAL LOW (ref 28–48)
MCH: 28 pg (ref 25.4–34.6)
MCHC: 33.6 gm/dl (ref 30.0–36.0)
MCV: 83.3 fL (ref 80.0–98.0)
MONOCYTES: 7 % (ref 1–13)
MPV: 10.8 fL — ABNORMAL HIGH (ref 6.0–10.0)
NEUTROPHILS: 67.9 % — ABNORMAL HIGH (ref 34–64)
NRBC: 0 (ref 0–0)
PLATELET: 197 10*3/uL (ref 140–450)
RBC: 4.5 M/uL (ref 3.60–5.20)
RDW-SD: 40.1 (ref 36.4–46.3)
WBC: 9.8 10*3/uL (ref 4.0–11.0)

## 2015-04-09 LAB — EKG, 12 LEAD, INITIAL
Atrial Rate: 77 {beats}/min
Calculated P Axis: 73 degrees
Calculated R Axis: 38 degrees
Calculated T Axis: 22 degrees
Diagnosis: NORMAL
P-R Interval: 148 ms
Q-T Interval: 426 ms
QRS Duration: 72 ms
QTC Calculation (Bezet): 482 ms
Ventricular Rate: 77 {beats}/min

## 2015-04-09 LAB — RENAL FUNCTION PANEL
Albumin: 3 gm/dl — ABNORMAL LOW (ref 3.4–5.0)
BUN: 22 mg/dl (ref 7–25)
CO2: 26 mEq/L (ref 21–32)
Calcium: 9.1 mg/dl (ref 8.5–10.1)
Chloride: 102 mEq/L (ref 98–107)
Creatinine: 0.8 mg/dl (ref 0.6–1.3)
GFR est AA: >60
GFR est non-AA: >60
Glucose: 178 mg/dl — ABNORMAL HIGH (ref 74–106)
Phosphorus: 3.3 mg/dl (ref 2.5–4.9)
Potassium: 3.3 mEq/L — ABNORMAL LOW (ref 3.5–5.1)
Sodium: 137 mEq/L (ref 136–145)

## 2015-04-09 LAB — GLUCOSE, POC: Glucose (POC): 131 mg/dL — ABNORMAL HIGH (ref 65–105)

## 2015-04-09 LAB — MAGNESIUM: Magnesium: 1.9 mg/dl (ref 1.8–2.4)

## 2015-04-09 MED ORDER — OMEPRAZOLE 20 MG CAP, DELAYED RELEASE
20 mg | Freq: Every day | ORAL | Status: DC
Start: 2015-04-09 — End: 2015-04-10
  Administered 2015-04-09 – 2015-04-10 (×2): via ORAL

## 2015-04-09 MED ORDER — SODIUM CHLORIDE 0.9 % IJ SYRG
INTRAMUSCULAR | Status: AC
Start: 2015-04-09 — End: 2015-04-08
  Administered 2015-04-09: 01:00:00

## 2015-04-09 MED ORDER — METOPROLOL TARTRATE 25 MG TAB
25 mg | Freq: Two times a day (BID) | ORAL | Status: DC
Start: 2015-04-09 — End: 2015-04-10
  Administered 2015-04-10 (×2): via ORAL

## 2015-04-09 MED ORDER — POTASSIUM CHLORIDE SR 20 MEQ TAB, PARTICLES/CRYSTALS
20 mEq | ORAL | Status: AC
Start: 2015-04-09 — End: 2015-04-09
  Administered 2015-04-09: 17:00:00 via ORAL

## 2015-04-09 MED ORDER — OMEPRAZOLE 20 MG CAP, DELAYED RELEASE
20 mg | Freq: Every day | ORAL | Status: DC
Start: 2015-04-09 — End: 2015-04-09

## 2015-04-09 MED FILL — CARDENE 20 MG/200 ML(0.1 MG/ML) IN SOD CHLOR(ISO-OSM) INTRAVENOUS SOLN: 20 mg/0 mL (0.1 mg/mL) | INTRAVENOUS | Qty: 200

## 2015-04-09 MED FILL — KLOR-CON M20 MEQ TABLET,EXTENDED RELEASE: 20 mEq | ORAL | Qty: 2

## 2015-04-09 MED FILL — GABAPENTIN 300 MG CAP: 300 mg | ORAL | Qty: 1

## 2015-04-09 MED FILL — CLONIDINE 0.1 MG TAB: 0.1 mg | ORAL | Qty: 1

## 2015-04-09 MED FILL — MORPHINE 2 MG/ML INJECTION: 2 mg/mL | INTRAMUSCULAR | Qty: 1

## 2015-04-09 MED FILL — SEROQUEL XR 50 MG TABLET,EXTENDED RELEASE: 50 mg | ORAL | Qty: 3

## 2015-04-09 MED FILL — BD POSIFLUSH NORMAL SALINE 0.9 % INJECTION SYRINGE: INTRAMUSCULAR | Qty: 30

## 2015-04-09 MED FILL — OMEPRAZOLE 20 MG CAP, DELAYED RELEASE: 20 mg | ORAL | Qty: 1

## 2015-04-09 MED FILL — DICLOFENAC 75 MG TAB, DELAYED RELEASE: 75 mg | ORAL | Qty: 1

## 2015-04-09 MED FILL — AMLODIPINE 5 MG TAB: 5 mg | ORAL | Qty: 2

## 2015-04-09 MED FILL — LOVENOX 40 MG/0.4 ML SUBCUTANEOUS SYRINGE: 40 mg/0.4 mL | SUBCUTANEOUS | Qty: 0.4

## 2015-04-09 MED FILL — METOPROLOL TARTRATE 100 MG TAB: 100 mg | ORAL | Qty: 1

## 2015-04-09 MED FILL — HYDRALAZINE 50 MG TAB: 50 mg | ORAL | Qty: 1

## 2015-04-09 MED FILL — OXYCODONE-ACETAMINOPHEN 10 MG-325 MG TAB: 10-325 mg | ORAL | Qty: 1

## 2015-04-09 NOTE — Progress Notes (Signed)
Interviewed....Marland Kitchen.Marland Kitchen.Patient and spouse  [facility] dc [facility]  Referral made.  Patient/family feel they need help at home....Marland Kitchen.yes for BP management  Discharge Planning Assistant asked to load into Edischarge .Marland Kitchen...Marland Kitchen.na    Anticipated Discharge Date:....2-5 days    NOK...spouse  Address correct on facesheet...yes  PCP correct on facesheet...yes  DME used at home....cane  DME available at home....same  Orientation at home....alert and oriented x3  Orientation now.... alert and oriented x3  Rehab in last three months....no  Prior to admission open services:....no  Personal Care Agency.....Marland Kitchen.na  Transportation...undetermined  Patient/family agree with dc plan....yes  Life-limiting illness: COPD, CHF, Cancer, Stroke, ESRD.....yes  Palliative Care Consult needed...not at this time    Case Management Assessment    ABUSE/NEGLECT SCREENING   Physical Abuse/Neglect: Denies   Sexual Abuse: Denies   Sexual Abuse: Denies   Other Abuse/Issues: Denies          PRIMARY DECISION MAKER   Primary Decision Maker Name: [name] [name]           Primary Decision Maker Relationship to Patient: Spouse                   CARE MANAGEMENT INTERVENTIONS                           Transition of Care Consult (CM Consult): Home Health                               Current Support Network: Lives with Spouse   Reason for Referral: DCP Rounds   History Provided By: Patient   Patient Orientation: Alert and Oriented   Cognition: Alert   Support System Response: Cooperative   Previous Living Arrangement: Lives with Family Independent       Prior Functional Level: Independent in ADLs/IADLs   Current Functional Level: Assistance with the following:, Mobility   Primary Language: English   Can patient return to prior living arrangement: Yes   Ability to make needs known:: Good   Family able to assist with home care needs:: Yes               Types of Needs Identified: Activity/Exercise   Anticipated Discharge Needs: Home Health Services            Plan discussed with Pt/Family/Caregiver: Yes   Freedom of Choice Offered: Yes      DISCHARGE LOCATION   Discharge Placement: Home with home health

## 2015-04-09 NOTE — Progress Notes (Signed)
Patient complaints of numbness on toes. No other complaints. Page Dr. Robin SearingMacGregor. Waiting for call back.

## 2015-04-09 NOTE — Progress Notes (Signed)
[facility]  Face to Face En[facility]    Patient???s Name: [name] [name]           Date of Birth: 10/19/1950    Primary Diagnosis: Accelerated hypertension                    Admit Date: 04/07/2015    Date of Face to Face:  April 09, 2015                    Medical Record Number: [id number]     Attending: Otilio CarpenAnthony A [name], MD      Physician Attestation:  To be filled out by physician who conducted Face-to Face encounter.    Current Problem List:  The encounter with the patient was in whole, or in part, for the following medical condition, which is the primary reason home health care (list medical condition):    Patient Active Hospital Problem List:   Accelerated hypertension (04/08/2015)    Assessment:     Plan:                               Face to Face Encounter findings are related to primary reason for home care:   yes.     1. I certify that the patient needs intermittent care as follows: skilled nursing care:  skilled observation/assessment, patient education  physical therapy: strengthening    2. I certify that this patient is homebound, that is: 1) patient requires the use of a undetermined device, special transportation, or assistance of another to leave the home; or 2) patient's condition makes leaving the home medically contraindicated; and 3) patient has a normal inability to leave the home and leaving the home requires considerable and taxing effort.  Patient may leave the home for infrequent and short duration for medical reasons, and occasional absences for non-medical reasons. Homebound status is due to the following functional limitations: Patient with strength deficits limiting the performance of all ADL's without caregiver assistance or the use of an assistive device.    3. I certify that this patient is under my care and that I, or a nurse practitioner or physician???s assistant, or clinical nurse specialist, or certified nurse midwife, working with me, had a Face-to-Face Encounter  that meets the physician Face-to-Face Encounter requirements.  The following are the clinical findings from the Face-to-Face encounter that support the need for skilled services and is a summary of the encounter:     See discharge summary    Electronically signed:  Yetta Numbersamela Jean Katz, RN  04/09/2015      THE FOLLOWING TO BE COMPLETED BY THE  PHYSICIAN:    I concur with the findings described above from the F2F encounter that this patient is homebound and in need of a skilled service.    Electronically signed:  Certifying Physician:

## 2015-04-09 NOTE — Other (Signed)
Patient complaints of "chest pain-indigestion". EKG done. Dr. Robin SearingMacGregor in the unit and notified.

## 2015-04-09 NOTE — Progress Notes (Signed)
Received patient from ICU, VS stable, tele verified, SR, HR 84, fall precautions reviewed with patient, dinner tray offered to patient, will continue to monitor.

## 2015-04-09 NOTE — Other (Signed)
TRANSFER - OUT REPORT:    Verbal report given to [facility](name[facility] on Anne ShutterMonica R Bink  being transferred to 2 [facility](unit) for routine progression of care       Report consisted of patient???s Situation, Background, Assessment and   Recommendations(SBAR).     Information from the following report(s) SBAR, Kardex, Intake/Output, MAR, Recent Results and Cardiac Rhythm SR was reviewed with the receiving nurse.    Lines:   Peripheral IV 04/08/15 Right Antecubital (Active)   Site Assessment Clean, dry, & intact 04/09/2015  8:00 AM   Phlebitis Assessment 0 04/09/2015  8:00 AM   Infiltration Assessment 0 04/09/2015  8:00 AM   Dressing Status Clean, dry, & intact 04/09/2015  8:00 AM   Dressing Type Transparent 04/09/2015  8:00 AM   Hub Color/Line Status Pink;Flushed;Capped 04/09/2015  8:00 AM   Action Taken Open ports on tubing capped 04/09/2015  8:00 AM   Alcohol Cap Used Yes 04/09/2015  8:00 AM       Peripheral IV 04/08/15 Left;Lower Cephalic (Active)   Site Assessment Clean, dry, & intact 04/09/2015  8:00 AM   Phlebitis Assessment 0 04/09/2015  8:00 AM   Infiltration Assessment 0 04/09/2015  8:00 AM   Dressing Status Clean, dry, & intact 04/09/2015  8:00 AM   Dressing Type Transparent 04/09/2015  8:00 AM   Hub Color/Line Status Pink;Flushed;Capped 04/09/2015  8:00 AM   Action Taken Open ports on tubing capped 04/09/2015  8:00 AM   Alcohol Cap Used Yes 04/09/2015  8:00 AM        Opportunity for questions and clarification was provided.      Patient transported with:   Tech     All belongings sent with patient.

## 2015-04-09 NOTE — Progress Notes (Signed)
Medicine Progress Note    Patient: [name] [name]   Age:  64 y.o.  DOA: 04/07/2015   Admit Dx / CC: Accelerated hypertension  LOS:  LOS: 1 day     Assessment/Plan     Hypertensive urgency  -due to poorly controlled HTN due to medication non compliance  -cont home meds  -stop hydralazine, decrease metoprolol    ??  Meningioma  -10 x 9 x 7 mm in  left posterior fossa adjacent to petrous bone and inferior margin of tentorium. ??  -MRI with contrast ordered,   -OPT NS evaluation        Subjective:   Patient seen and examined. HA better, BP actaly low when seen, tingling in toes earlier, now resolved , No N/V, F/C, CP or SOB      Objective:     Visit Vitals   ??? BP 96/77   ??? Pulse 76   ??? Temp 98.5 ??F (36.9 ??C)   ??? Resp 18   ??? Ht 5\' 7"  (1.702 m)   ??? Wt 84.4 kg (186 lb)   ??? SpO2 95%   ??? BMI 29.13 kg/m2       Physical Exam:  General appearance: alert, cooperative, no distress, appears stated age  Head: Normocephalic, without obvious abnormality, atraumatic  Neck: supple, trachea midline  Lungs: clear to auscultation bilaterally  Heart: regular rate and rhythm, S1, S2 normal, no murmur, click, rub or gallop  Abdomen: soft, non-tender. Bowel sounds normal. No masses,  no organomegaly  Extremities: extremities normal, atraumatic, no cyanosis or edema  Skin: Skin color, texture, turgor normal. No rashes or lesions  Neurologic: Grossly normal        Medications Reviewed:  Current Facility-Administered Medications   Medication Dose Route Frequency   ??? omeprazole (PRILOSEC) capsule 20 mg  20 mg Oral ACB   ??? metoprolol tartrate (LOPRESSOR) tablet 25 mg  25 mg Oral BID   ??? morphine injection 2 mg  2 mg IntraVENous Q4H PRN   ??? cloNIDine HCl (CATAPRES) tablet 0.3 mg  0.3 mg Oral BID   ??? diclofenac EC (VOLTAREN) tablet 75 mg  75 mg Oral BID WITH MEALS   ??? gabapentin (NEURONTIN) capsule 300 mg  300 mg Oral TID   ??? QUEtiapine SR (SEROquel XR) tablet 150 mg  150 mg Oral QHS   ??? naloxone (NARCAN) injection 0.1 mg  0.1 mg IntraVENous PRN    ??? acetaminophen (TYLENOL) tablet 650 mg  650 mg Oral Q4H PRN    Or   ??? acetaminophen (TYLENOL) solution 650 mg  650 mg Oral Q4H PRN    Or   ??? acetaminophen (TYLENOL) suppository 650 mg  650 mg Rectal Q4H PRN   ??? diphenhydrAMINE (BENADRYL) injection 25 mg  25 mg IntraVENous Q4H PRN   ??? ondansetron (ZOFRAN) injection 4 mg  4 mg IntraVENous Q4H PRN   ??? enoxaparin (LOVENOX) injection 40 mg  40 mg SubCUTAneous Q24H   ??? oxyCODONE-acetaminophen (PERCOCET 10) 10-325 mg per tablet 1 Tab  1 Tab Oral Q4H PRN   ??? amLODIPine (NORVASC) tablet 10 mg  10 mg Oral DAILY   ??? chlorthalidone (HYGROTEN) tablet 25 mg  25 mg Oral DAILY         Intake and Output:  Current Shift:  10/27 0701 - 10/27 1900  In: 600 [P.O.:600]  Out: -   Last three shifts:  10/25 1901 - 10/27 0700  In: 2486.7 [P.O.:1680; I.V.:806.7]  Out: -  Lab/Data Reviewed:  All lab and imaging  results for the last 24 hours reviewed.    Kristie Cowman, MD  P# [phone]  April 09, 2015

## 2015-04-09 NOTE — Other (Signed)
Dr. Robin SearingMacGregor called back and notified via phone regarding patient's complaints of numbness on her toes. No new order.

## 2015-04-10 MED ORDER — GADOBUTROL 1 MMOL/ML (604.72 MG/ML) IV
1 mmol/mL (604.72 mg/mL) | Freq: Once | INTRAVENOUS | Status: AC
Start: 2015-04-10 — End: 2015-04-09
  Administered 2015-04-10: 01:00:00 via INTRAVENOUS

## 2015-04-10 MED ORDER — METOPROLOL TARTRATE 25 MG TAB
25 mg | ORAL_TABLET | Freq: Two times a day (BID) | ORAL | 1 refills | Status: DC
Start: 2015-04-10 — End: 2015-07-07

## 2015-04-10 MED ORDER — AMLODIPINE 10 MG TAB
10 mg | ORAL_TABLET | Freq: Every day | ORAL | 1 refills | Status: DC
Start: 2015-04-10 — End: 2015-07-23

## 2015-04-10 MED ORDER — CHLORTHALIDONE 25 MG TAB
25 mg | ORAL_TABLET | Freq: Every day | ORAL | 1 refills | Status: DC
Start: 2015-04-10 — End: 2015-07-07

## 2015-04-10 MED ORDER — CLONIDINE 0.3 MG TAB
0.3 mg | ORAL_TABLET | Freq: Two times a day (BID) | ORAL | 1 refills | Status: DC
Start: 2015-04-10 — End: 2015-07-23

## 2015-04-10 MED FILL — GABAPENTIN 300 MG CAP: 300 mg | ORAL | Qty: 1

## 2015-04-10 MED FILL — METOPROLOL TARTRATE 25 MG TAB: 25 mg | ORAL | Qty: 1

## 2015-04-10 MED FILL — CLONIDINE 0.1 MG TAB: 0.1 mg | ORAL | Qty: 3

## 2015-04-10 MED FILL — CHLORTHALIDONE 25 MG TAB: 25 mg | ORAL | Qty: 1

## 2015-04-10 MED FILL — OXYCODONE-ACETAMINOPHEN 10 MG-325 MG TAB: 10-325 mg | ORAL | Qty: 1

## 2015-04-10 MED FILL — MORPHINE 2 MG/ML INJECTION: 2 mg/mL | INTRAMUSCULAR | Qty: 1

## 2015-04-10 MED FILL — DICLOFENAC 75 MG TAB, DELAYED RELEASE: 75 mg | ORAL | Qty: 1

## 2015-04-10 MED FILL — OMEPRAZOLE 20 MG CAP, DELAYED RELEASE: 20 mg | ORAL | Qty: 1

## 2015-04-10 MED FILL — AMLODIPINE 5 MG TAB: 5 mg | ORAL | Qty: 2

## 2015-04-10 MED FILL — GADAVIST 1 MMOL/ML (604.72 MG/ML) INTRAVENOUS SOLUTION: 1 mmol/mL (604.72 mg/mL) | INTRAVENOUS | Qty: 9

## 2015-04-10 MED FILL — SEROQUEL XR 50 MG TABLET,EXTENDED RELEASE: 50 mg | ORAL | Qty: 3

## 2015-04-10 NOTE — Progress Notes (Signed)
Patient discharged home with home health ([facility]).  Spouse, [name] [name], to pick up patient for discharge.  [facility] email and Anastasia/Bonnie/Junaita notified of referral.  Orders are in.  Face-to-face completed and waiting for physician signature.      Discharge Plan:   Home with home health    Discharge Date:     04/10/2015     Assisted Living Facility:    N/A    The facility confirmed that they are ready to receive the patient:     Yes/No; the CM spoke with     N/A    Home Health Needed:     yes    Home Health Agency:    [facility]    Confirmed start of care with the home health agency and spoke with:      [facility] email and Trinda PascalAnastasia    DME needed and ordered for Discharge:    N/A    DME Company:    N/A    CM confirmed delivery of DME: with      N/A    TCC Referral:     N/A    Medication Assistance given     Y/N       meds given (please list)     N/A    Change(MDC/SSDI) Referral/outcome    N/A     Transportation: Family/ Medical    family    Transport Time:    N/A    Family, MD, patient, nurse and facility aware of pickup time    N/A

## 2015-04-10 NOTE — Progress Notes (Signed)
[facility]    Face to Face Encounter    Patient???s Name: [name] [name]    Date of Birth: 03/10/1951    Primary Diagnosis: Accelerated hypertension    Date of Face to Face:   04/10/2015                                  Face to Face Encounter findings are related to primary reason for home care:   yes.     1. I certify that the patient needs intermittent care as follows: skilled nursing care:  skilled observation/assessment, patient education, complex care plan management and administration of medications    2. I certify that this patient is homebound, that is: 1) patient requires the use of a none device, special transportation, or assistance of another to leave the home; or 2) patient's condition makes leaving the home medically contraindicated; and 3) patient has a normal inability to leave the home and leaving the home requires considerable and taxing effort.  Patient may leave the home for infrequent and short duration for medical reasons, and occasional absences for non-medical reasons. Homebound status is due to the following functional limitations: Patient with poor safety awareness and is at risk for falls without assistance of another person and the use of an assistive device.  Patient with poor ambulation endurance limiting their safe ability to ascend/descend the required number of steps to leave the home.  Patient with increased shortness of breath with all exertional activity limiting ambulation outside the home. Patient with strength deficits limiting the ability to carry portable O2 for distances outside the home without the assistance of a caregiver.    3. I certify that this patient is under my care and that I, or a nurse practitioner or physician???s assistant, or clinical nurse specialist, or certified nurse midwife, working with me, had a Face-to-Face Encounter that meets the physician Face-to-Face Encounter requirements.  The  following are the clinical findings from the Face-to-Face encounter that support the need for skilled services and is a summary of the encounter: recent hospitalization    See discharge summary      Salome SpottedDianna H Francisco, RN  04/10/2015      THE FOLLOWING TO BE COMPLETED BY THE COMMUNITY PHYSICIAN:    I concur with the findings described above from the F2F encounter that this patient is homebound and in need of a skilled service.    Certifying Physician: _____________________________________      Printed Certifying Physician Name: _____________________________________    Date: _________________

## 2015-04-10 NOTE — Discharge Summary (Signed)
Discharge Summary    Patient: [name] [name]               Sex: female          DOA: 04/07/2015         Date of Birth:  12/20/1950      Age:  64 y.o.        LOS:  LOS: 2 days                Admit Date: 04/07/2015    Discharge Date: 04/10/2015    Discharge Medications:     Current Discharge Medication List      CONTINUE these medications which have CHANGED    Details   amLODIPine (NORVASC) 10 mg tablet Take 1 Tab by mouth daily.  Qty: 30 Tab, Refills: 1    Associated Diagnoses: Essential hypertension with goal blood pressure less than 140/90      chlorthalidone (HYGROTEN) 25 mg tablet Take 1 Tab by mouth daily.  Qty: 30 Tab, Refills: 1    Associated Diagnoses: Essential hypertension with goal blood pressure less than 140/90      cloNIDine HCl (CATAPRES) 0.3 mg tablet Take 1 Tab by mouth two (2) times a day.  Qty: 60 Tab, Refills: 1      metoprolol tartrate (LOPRESSOR) 25 mg tablet Take 1 Tab by mouth two (2) times a day.  Qty: 30 Tab, Refills: 1         CONTINUE these medications which have NOT CHANGED    Details   oxyCODONE-acetaminophen (PERCOCET) 5-325 mg per tablet Take 1 Tab by mouth every twelve (12) hours as needed for Pain. Max Daily Amount: 2 Tabs. Indications: PAIN  Qty: 40 Tab, Refills: 0      diclofenac EC (VOLTAREN) 75 mg EC tablet Take 1 Tab by mouth two (2) times daily (with meals).  Qty: 60 Tab, Refills: 0      gabapentin (NEURONTIN) 300 mg capsule Take 1 Cap by mouth three (3) times daily.  Qty: 60 Cap, Refills: 0      acetaminophen (TYLENOL) 325 mg tablet Take 650 mg by mouth every four (4) hours as needed for Pain.      SEROQUEL XR 150 mg sr tablet Take 1 Tab by mouth nightly.  Qty: 30 Tab, Refills: 5    Associated Diagnoses: PTSD (post-traumatic stress disorder)         STOP taking these medications       hydrALAZINE (APRESOLINE) 100 mg tablet Comments:   Reason for Stopping:         oxyCODONE-acetaminophen (PERCOCET) 7.5-325 mg per tablet Comments:   Reason for Stopping:          ibuprofen (MOTRIN) 600 mg tablet Comments:   Reason for Stopping:               Follow-up:   PMD one week - BP check  Neurosurgery one month - new pt eval of meningioma     Discharge Condition: Stable    Activity: Activity as tolerated    Diet: Cardiac Diet    Labs:  Labs: Results:       Chemistry Recent Labs      04/09/15   0343  04/08/15   0318   GLU  178*  230*   NA  137  137   K  3.3*  3.1*   CL  102  101   CO2  26  26   BUN  22  11   CREA  0.8  0.6   CA  9.1  9.5   AP   --   99   TP   --   8.9*   ALB  3.0*  3.7      CBC w/Diff Recent Labs      04/09/15   0343  04/08/15   0212   WBC  9.8  12.8*   RBC  4.50  5.55*   HGB  12.6*  15.6   HCT  37.5  46.1   PLT  197  224   GRANS  67.9*  69.8*   LYMPH  24.3*  22.7*   EOS  0.3  0.9      Cardiac Enzymes No results for input(s): CPK, CKND1, MYO in the last 72 hours.    No lab exists for component: CKRMB, TROIP   Coagulation No results for input(s): PTP, INR, APTT in the last 72 hours.    No lab exists for component: INREXT    Lipid Panel No results found for: CHOL, CHOLPOCT, CHOLX, CHLST, CHOLV, [id number], HDL, LDL, NLDLCT, DLDL, LDLC, DLDLP, [id number], VLDLC, VLDL, TGL, TGLX, TRIGL, [id number], TRIGP, TGLPOCT, [id number], CHHD, CHHDX   BNP No results for input(s): BNPP in the last 72 hours.   Liver Enzymes Recent Labs      04/09/15   0343  04/08/15   0318   TP   --   8.9*   ALB  3.0*  3.7   AP   --   99   SGOT   --   12*      Thyroid Studies Lab Results   Component Value Date/Time    TSH 0.54 12/18/2014 02:30 PM          Imaging:  MRI BRAIN W WO CONT (Final result) Result time: 04/10/15 09:04:38   ?? Final result   ?? Impression:   ?? IMPRESSION:  1. Enhancing mass in the left posterior fossa adjacent to the petrous temporal  bone as described above. Main consideration would include meningioma. Other less  likely differential considerations would include dural metastasis and lymphoma.  Consider surveillance or further evaluation.     2. ??Chronic small vessel ischemic changes, advanced for patient age.     ?? Narrative:   ?? CLINICAL HISTORY: ??Abnormal CT, meningioma    PROCEDURE: MRI brain without and with contrast    COMPARISON: Head CT dated 04/08/2015    FINDINGS:    There is age-related cerebral atrophy. There is extensive chronic small vessel  ischemic change, advanced for patient age. As noted on CT, there is a enhancing  extra-axial mass in the left CP angle along the petrous temporal bone measuring  10 mm. This is observed on image 9 series 12, image 6 series 11 and image 21  series 13. There are dural tails extending to the tentorium cerebelli. Imaging  findings would favor meningioma. No other areas of abnormal brain parenchymal  enhancement. The sella, pineal region, craniocervical junction are unremarkable.  Skull base flow voids are patent.     ??           Consults: None    Treatment Team: Treatment Team: Attending Provider: Kristie Cowman, MD; Consulting Provider: Otilio Carpen, MD; Hospitalist: Kristie Cowman, MD      Hospital Course:    [name] [name] is a 64 y.o. year old female who presents with Headache has been present for 2 days , frontal sharp / throbbing  severe w/ radiation , no reliving provoking factors. Pt went to orthopedic apt and BP measured and was elevated > 180SYS And she was told to come to ED. Pt admitts she is poorly compliant with medications ( on 5 BP meds) and has missed doses in last few days. whe seen in the Ed BP in 150 on cardiene drip HA starting to improve   ??    Major issues addressed during hospitalization outlined  below.     Hypertensive urgency  -due to poorly controlled HTN due to medication non compliance  -restart home meds, titrate off nicardipine drip,   ??  Meningioma  -10 x 9 x 7 mm in  left posterior fossa adjacent to petrous bone and inferior margin of tentorium. ??  -MRI with contrast ordered, may need NS evaluation    Kristie Cowman, MD  April 10, 2015        Total time spent 

## 2015-04-10 NOTE — Other (Signed)
Bedside and Verbal shift change report given to Olga RN (oncoming nurse) by Kareema RN (offgoing nurse). Report included the following information SBAR, Kardex, MAR and Recent Results.

## 2015-04-13 NOTE — Progress Notes (Signed)
[facility]    Transitional Care Nurse Navigator Note    Hospital Follow Up for [name] [name] who was hospitalized at [facility] 10/25-10/28/16 for hypertensive urgency.  The patient was discharged to home with [facility].    RRAT score: 14  Moderate    Medical History:     Past Medical History   Diagnosis Date   ??? Arthritis    ??? Chronic pain    ??? GERD (gastroesophageal reflux disease)    ??? Hypertension    ??? Liver disease      HEP C   ??? PTSD (post-traumatic stress disorder)      lived through [place]       Patient presenting symptoms:  Severe headache    Discharge Summary written by Dr. Wilmon Arms is below in italics.    Follow-up: ??  PMD one week - BP check  Neurosurgery one month - new pt eval of meningioma   ??  Discharge Condition: Stable  ??  Activity: Activity as tolerated  ??  Diet: Cardiac Diet    Imaging:       MRI BRAIN W WO CONT (Final result)?? Result time: 04/10/15 09:04:38??   ???? Final result??   ???? Impression:??   ???? IMPRESSION:  1. Enhancing mass in the left posterior fossa adjacent to the petrous temporal  bone as described above. Main consideration would include meningioma. Other less  likely differential considerations would include dural metastasis and lymphoma.  Consider surveillance or further evaluation.    2. ??Chronic small vessel ischemic changes, advanced for patient age.  ??   ???? Narrative:??   ???? CLINICAL HISTORY: ??Abnormal CT, meningioma    PROCEDURE: MRI brain without and with contrast    COMPARISON: Head CT dated 04/08/2015    FINDINGS:    There is age-related cerebral atrophy. There is extensive chronic small vessel  ischemic change, advanced for patient age. As noted on CT, there is a enhancing  extra-axial mass in the left CP angle along the petrous temporal bone measuring  10 mm. This is observed on image 9 series 12, image 6 series 11 and image 21  series 13. There are dural tails extending to the tentorium cerebelli. Imaging   findings would favor meningioma. No other areas of abnormal brain parenchymal  enhancement. The sella, pineal region, craniocervical junction are unremarkable.  Skull base flow voids are patent.  ??   ???? ??   ??  ??  Consults: None  ??  Treatment Team: Treatment Team: Attending Provider: Kristie Cowman, MD; Consulting Provider: Otilio Carpen, MD; Hospitalist: Kristie Cowman, MD  ??  ??  Hospital Course:  ??  [name] [name] is a 64 y.o. year old female who presents with Headache has been present for 2 days , frontal sharp / throbbing severe w/ radiation , no reliving provoking factors. Pt went to orthopedic apt and BP measured and was elevated > 180SYS And she was told to come to ED. Pt admitts she is poorly compliant with medications ( on 5 BP meds) and has missed doses in last few days. whe seen in the Ed BP in 150 on cardiene drip HA starting to improve   ??  ??  Major issues addressed during hospitalization outlined below. ??  ??  Hypertensive urgency  -due to poorly controlled HTN due to medication non compliance  -restart home meds, titrate off nicardipine drip,   ????  Meningioma  -10  x 9 x 7 mm in left posterior fossa adjacent to petrous bone and inferior margin of tentorium. ??  -MRI with contrast ordered, may need NS evaluation  ??      Significant Lab/Diagnostic Findings:   Lab Results  Component Value Date/Time   WBC 9.8 04/09/2015 03:43 AM   HEMOGLOBIN (POC) 11.8 12/16/2014 02:13 PM   HGB 12.6 04/09/2015 03:43 AM   HCT 37.5 04/09/2015 03:43 AM   PLATELET 197 04/09/2015 03:43 AM   MCV 83.3 04/09/2015 03:43 AM       Lab Results   Component Value Date/Time    SODIUM 137 04/09/2015 03:43 AM    POTASSIUM 3.3 04/09/2015 03:43 AM    CHLORIDE 102 04/09/2015 03:43 AM    CO2 26 04/09/2015 03:43 AM    ANION GAP 11 01/31/2015 04:30 AM    GLUCOSE 178 04/09/2015 03:43 AM    BUN 22 04/09/2015 03:43 AM    CREATININE 0.8 04/09/2015 03:43 AM    BUN/CREATININE RATIO 21 01/31/2015 04:30 AM    GFR EST AA >60.0 04/09/2015 03:43 AM     GFR EST NON-AA >60 04/09/2015 03:43 AM    CALCIUM 9.1 04/09/2015 03:43 AM        Medication Reconciliation completed:  Yes.    Pain:  Head ache occasionally since discharge.    New medications at discharge include:  Norvasc, Hygroten, Catapres, Lopressor.  Discontinued:  Apresoline, Percocet, Motrin.    If multiple admissions, ED visits, check and reviewed PMP:  No.    Barriers to care?  None voiced     Support System consists of: supportive husband    Previous Use of Home Health Agency, if so what agency?      Advance Medical Directive on file in EMR?  Yes.    Future Plan for Patient to include communication plan:  Apt with Dr. Jac Canavanharakan on 04/17/15.  Follow up with neurosurgery.    Adherence to previous treatment and likelihood for f/u: under  assessment.    This represents Transitions of Care because NN spoke with patient and/or caregiver within one business days of discharge.  Pt's TCM follow up appt is scheduled with Dr. Jac Canavanharakan on 04/17/15 which is within seven days of discharge.    Called patient on 04/13/15 and verified with 2 identifiers. Introduced self/role and purpose of the call.  Patient stated "Well, I still have an occasional headache."  Patient stated her husband is helping manage medication so she does not miss any doses.  Patient is aware of apt with Dr. Jac Canavanharakan on 04/17/15 and husband will provide transportation.  Ms Barry DienesOwens state she has had some family stressors and these contributed to her missing medications for several days.  Patient verbalized importance of taking medication as directed and has put several stress reducing plans in place.  Will continue to follow.    The patient has been provided with my direct contact number for future questions, concerns or assistance.

## 2015-04-17 ENCOUNTER — Ambulatory Visit: Attending: Family Medicine | Primary: Physician Assistant

## 2015-04-21 NOTE — Progress Notes (Signed)
[facility]    Transitions of Care Follow Up      Call to patient's listed number.  Reached female who stated patient is not expected to return until around 1700.  Will plan to call back this week.  The patient had an appointment scheduled with Dr. Jac Canavanharakan on 04/17/15 for which she was a no show.  No other appointments noted in the chart.

## 2015-04-22 ENCOUNTER — Ambulatory Visit: Admit: 2015-04-22 | Discharge: 2015-04-22 | Payer: MEDICARE | Attending: Family Medicine | Primary: Physician Assistant

## 2015-04-22 DIAGNOSIS — I10 Essential (primary) hypertension: Secondary | ICD-10-CM

## 2015-04-22 MED ORDER — LISINOPRIL 5 MG TAB
5 mg | ORAL_TABLET | Freq: Every day | ORAL | 1 refills | Status: DC
Start: 2015-04-22 — End: 2015-06-28

## 2015-04-22 MED ORDER — KETOROLAC TROMETHAMINE 30 MG/ML INJECTION
30 mg/mL (1 mL) | Freq: Once | INTRAMUSCULAR | 0 refills | Status: AC
Start: 2015-04-22 — End: 2015-04-22

## 2015-04-22 NOTE — Patient Instructions (Addendum)
Start lisinopril daily. Continue with current medications. Continue to monitor blood pressure daily. In 2 weeks please drop off these readings to me.    Follow up with neurologist for insomnia.Lisinopril (By mouth)   Lisinopril (lye-SIN-oh-pril)  Treats high blood pressure and heart failure. Also given to reduce the risk of death after a heart attack. This medicine is an ACE inhibitor.   Brand Name(s):Prinivil, Zestril   There may be other brand names for this medicine.  When This Medicine Should Not Be Used:   This medicine is not right for everyone. Do not use it if you had an allergic reaction to lisinopril or another ACE inhibitor, or if you are pregnant.  How to Use This Medicine:   Tablet  ?? Take your medicine as directed. Your dose may need to be changed several times to find what works best for you.  ?? Missed dose: Take a dose as soon as you remember. If it is almost time for your next dose, wait until then and take a regular dose. Do not take extra medicine to make up for a missed dose.  ?? Store the medicine in a closed container at room temperature, away from heat, moisture, and direct light.  Drugs and Foods to Avoid:   Ask your doctor or pharmacist before using any other medicine, including over-the-counter medicines, vitamins, and herbal products.  ?? Do not use this medicine together with aliskiren if you have diabetes.  ?? Some foods and medicines may affect how lisinopril works. Tell your doctor if you are using any of the following:   ?? Aliskiren, everolimus, lithium, sirolimus, temsirolimus  ?? Another blood pressure medicine, including an angiotensin receptor blocker (ARB)  ?? Insulin or diabetes medicine  ?? Diuretic (water pills, including amiloride, spironolactone, triamterene)  ?? NSAID pain or arthritis medicine (including aspirin, celecoxib, diclofenac, ibuprofen, naproxen)  ?? Ask your doctor before you use any medicine, supplement, or salt substitute that contains potassium.   Warnings While Using This Medicine:   ?? It is not safe to take this medicine during pregnancy. It could harm an unborn baby. Tell your doctor right away if you become pregnant.  ?? Tell your doctor if you are breastfeeding, or if you have kidney disease, liver disease, diabetes, or heart or blood vessel disease.  ?? This medicine may cause the following problems:  ?? Angioedema (severe swelling)  ?? Kidney problems  ?? Serious liver problems  ?? This medicine could lower your blood pressure too much, especially when you first use it or if you are dehydrated. Stand or sit up slowly if you feel lightheaded or dizzy.  ?? Do not stop using this medicine without asking your doctor, even if you feel well. This medicine will not cure your high blood pressure, but it will help keep it in a normal range. You may have to take blood pressure medicine for the rest of your life.  ?? Tell any doctor or dentist who treats you that you are using this medicine.  ?? Your doctor will do lab tests at regular visits to check on the effects of this medicine. Keep all appointments.  ?? Keep all medicine out of the reach of children. Never share your medicine with anyone.  Possible Side Effects While Using This Medicine:   Call your doctor right away if you notice any of these side effects:  ?? Allergic reaction: Itching or hives, swelling in your face or hands, swelling or tingling in your mouth or throat,  chest tightness, trouble breathing  ?? Blistering, peeling, or red skin rash  ?? Change in how much or how often you urinate  ?? Confusion, weakness, uneven heartbeat, trouble breathing, numbness or tingling in your hands, feet, or lips  ?? Dark urine or pale stools, nausea, vomiting, loss of appetite, stomach pain, yellow skin or eyes  ?? Fever, chills, sore throat, body aches  ?? Lightheadedness, dizziness, fainting  ?? Severe stomach pain (with or without nausea or vomiting)  If you notice these less serious side effects, talk with your doctor:    ?? Dry cough  If you notice other side effects that you think are caused by this medicine, tell your doctor.   Call your doctor for medical advice about side effects. You may report side effects to FDA at 1-800-FDA-1088  ?? 2016 [facility] [employer] Information is for End User's use only and may not be sold, redistributed or otherwise used for commercial purposes.  The above information is an educational aid only. It is not intended as medical advice for individual conditions or treatments. Talk to your doctor, nurse or pharmacist before following any medical regimen to see if it is safe and effective for you.

## 2015-04-22 NOTE — Progress Notes (Signed)
Anne ShutterMonica R Aquilino is a 64 y.o. female in today with c/o recurrent HAs and requesting a sleep study.    Learning assessment previously completed; primary language is [place].    1. Have you been to the ER, urgent care clinic since your last visit?  Hospitalized since your last visit?Yes Reason [facility], [facility].    2. Have you seen or consulted any other health care providers outside of the [facility] since your last visit?  Include any pap smears or colon screening. No

## 2015-04-22 NOTE — Progress Notes (Signed)
Headache and Referral Request        HPI: [name] ShutterMonica R [name] is a 64 y.o. female BL[place]  Here in follow up of her recent hospital [facility] for hypertensive urgency and daily headaches.  BP was in the 180s on day of admission and in the 150s systolic at time of ER arrival.  She underwent brain MRI and was noted to have 10mm mass in the left posterior fossa adjacent to the petrous temporal bone most likely correlating to meningioma. Surveillance recommended.  She had her medication regimen altered to as noted below Norvasc, Chlorthalidone, Toprol and Clonidine. She has been using her medication as prescribed.  She has been checking her blood pressures at home and they have been ranging 110's-130's/70's-90's.  She has been having  frontal headaches since discharge. Denies radiation of pain. Denies aura or photophobia    She would also like to see a sleep doctor as she has had several months of difficulty initiating and maintaining sleep. She denies any intrusive thoughts or dreams. She reports her seroquel used to help her sleep but that it no longer seems effective. She denies that pain is keeping her up.         Past Medical History   Diagnosis Date   ??? Arthritis    ??? Chronic pain    ??? GERD (gastroesophageal reflux disease)    ??? Hypertension    ??? Liver disease      HEP C   ??? PTSD (post-traumatic stress disorder)      lived through [place]       Current Outpatient Prescriptions   Medication Sig   ??? amLODIPine (NORVASC) 10 mg tablet Take 1 Tab by mouth daily.   ??? chlorthalidone (HYGROTEN) 25 mg tablet Take 1 Tab by mouth daily.   ??? cloNIDine HCl (CATAPRES) 0.3 mg tablet Take 1 Tab by mouth two (2) times a day.   ??? metoprolol tartrate (LOPRESSOR) 25 mg tablet Take 1 Tab by mouth two (2) times a day.   ??? diclofenac EC (VOLTAREN) 75 mg EC tablet Take 1 Tab by mouth two (2) times daily (with meals).    ??? gabapentin (NEURONTIN) 300 mg capsule Take 1 Cap by mouth three (3) times daily.   ??? SEROQUEL XR 150 mg sr tablet Take 1 Tab by mouth nightly.   ??? acetaminophen (TYLENOL) 325 mg tablet Take 650 mg by mouth every four (4) hours as needed for Pain.     No current facility-administered medications for this visit.        Allergies   Allergen Reactions   ??? Chocolate [Cocoa] Sneezing       Past Surgical History   Procedure Laterality Date   ??? Hx gyn  2010     hysterectomy   ??? Hx endoscopy     ??? 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 Hx hip replacement         Family History   Problem Relation Age of Onset   ??? Hypertension Mother    ??? Cancer Maternal Aunt    ??? Alcohol abuse Maternal Grandfather        Social History     Social History   ??? Marital status: MARRIED     Spouse name: N/A   ??? Number of children: N/A   ??? Years of education: N/A     Occupational History   ??? Not on file.  Social History Main Topics   ??? Smoking status: Never Smoker   ??? Smokeless tobacco: Never Used   ??? Alcohol use No   ??? Drug use: No   ??? Sexual activity: No     Other Topics Concern   ??? Not on file     Social History Narrative       Gen- No weight loss, No Malaise, No fatigue  Eyes- No dipoplia, blurry vision  CVS- No Chest pain, no palpitations, no edema  Resp- No Cough, SOB, DOE, Wheezing  Neuro- No headaches  Skin- No easy bruising, No rashes      Visit Vitals   ??? BP (!) 138/94 (BP 1 Location: Right arm, BP Patient Position: Sitting)   ??? Pulse 63   ??? Temp 98.2 ??F (36.8 ??C) (Oral)   ??? Resp 20   ??? Ht  (1.702 m)   ??? Wt 196 lb (88.9 kg)   ??? SpO2 97%   ??? BMI 30.7 kg/m2       GEN- NAD, AAOx3  CVS- RRR, +S1, +S2, No Murmurs, No JVD  PULM- CTABL, No W/R/R  EXT- No edema  NEURO-Using cane to assist ambulation, CN II-XII grossly intact  PSYCH- Euthymic, normal afffect        Assesment:  1. Essential hypertension  Improved control. Add lisinopril 5 mg daily. Continue to check BP regularly and drop off readings in 2 weeks   - lisinopril (PRINIVIL, ZESTRIL) 5 mg tablet; Take 1 Tab by mouth daily.  Dispense: 30 Tab; Refill: 1    2. Episodic tension-type headache, not intractable  May be vascular in origin due to elevated blood pressures. No sign  - ketorolac (TORADOL) 30 mg/mL (1 mL) injection; 2 mL by IntraMUSCular route once for 1 dose.  Dispense: 2 Vial; Refill: 0    3. Primary insomnia    - REFERRAL TO NEUROLOGY    4. Brain mass  Most likely meningioma per MRI reports. Will reimage in 3 months      I have discussed the diagnosis with the patient and the intended management  The patient has received an after-visit summary and questions were answered concerning future plans.  I have discussed medication usage, side effects and warnings with the patient as well.I have reviewed the plan of care with the patient, accepted their input and they are in agreement with the treatment goals.         Follow-up Disposition:  Return in about 1 month (around 05/22/2015) for hypertesnion.      Littie Deeds, MD                .

## 2015-04-24 ENCOUNTER — Ambulatory Visit
Admit: 2015-04-24 | Discharge: 2015-04-24 | Payer: MEDICARE | Attending: Orthopaedic Surgery | Primary: Physician Assistant

## 2015-04-24 DIAGNOSIS — M160 Bilateral primary osteoarthritis of hip: Secondary | ICD-10-CM

## 2015-04-24 MED ORDER — OXYCODONE-ACETAMINOPHEN 5 MG-325 MG TAB
5-325 mg | ORAL_TABLET | Freq: Two times a day (BID) | ORAL | 0 refills | Status: DC | PRN
Start: 2015-04-24 — End: 2015-06-16

## 2015-04-24 NOTE — Patient Instructions (Signed)
Hip Arthritis: Care Instructions  Your Care Instructions     Arthritis, also called osteoarthritis, is a breakdown of the tissue (cartilage) that cushions your joints. Many people have some arthritis as they age. When the cartilage in your hip joints wears down, your hip bone rubs against the hip socket. This causes pain and stiffness.  Work with your doctor to find the right mix of treatments for your arthritis. There are things you can do at home to protect your hip joints, ease your pain, and help you stay active. But if your arthritis becomes so bad that you cannot walk, you may need surgery to replace the hip joint.  Follow-up care is a key part of your treatment and safety. Be sure to make and go to all appointments, and call your doctor if you are having problems. It???s also a good idea to know your test results and keep a list of the medicines you take.  How can you care for yourself at home?  ?? Stay at a healthy weight. Being overweight puts extra strain on your hip joints.  ?? Talk to your doctor or physical therapist about exercises that will help ease hip pain. These tips may help.  ?? Stretch to help prevent stiffness and to prevent injury before you exercise. You may enjoy gentle forms of yoga to help keep your joints and muscles flexible.  ?? Walk instead of jog. Other types of exercise that are less stressful on the joints include riding a bike, swimming, and doing water exercise.  ?? Lift weights. Strong muscles help reduce stress on your joints. Stronger thigh muscles, for example, take some of the stress off of the knees and hips. Learn the right way to lift weights so you do not make joint pain worse.  ?? Take pain medicines exactly as directed.  ?? If the doctor gave you a prescription medicine for pain, take it as prescribed.  ?? If you are not taking a prescription pain medicine, ask your doctor if you can take an over-the-counter medicine.   ?? Use a cane, crutch, walker, or another device if you need help to get around. These can help rest your hips. You also can use other things to make life easier, such as a higher toilet seat.  ?? Do not sit in low chairs, which can make it painful to get up.  ?? Put heat or cold on your sore hips as needed. Use whichever helps you most. You also can go back and forth between hot and cold packs.  ?? Apply heat 2 or 3 times a day for 20 to 30 minutes???using a heating pad, hot shower, or hot pack???to relieve pain and stiffness.  ?? Put ice or a cold pack on your sore hips for 10 to 20 minutes at a time to numb the area. Put a thin cloth between the ice and your skin.  ?? Think about talking to your doctor about using capsaicin, a cream you apply to the skin for pain relief.  When should you call for help?  Call your doctor now or seek immediate medical care if:  ?? You have sudden swelling, warmth, or pain in any joint.  ?? You have joint pain and a fever or rash.  ?? You have such bad pain that you cannot use the joint.  Watch closely for changes in your health, and be sure to contact your doctor if:  ?? You have mild joint symptoms that continue even with more   than 6 weeks of care at home.  ?? You do not get better as expected.  ?? You have stomach pain or other problems with your medicine.  Where can you learn more?  Go to http://www.healthwise.net/[employer]Help[employer]ctions  Enter T640 in the search box to learn more about "Hip Arthritis: Care Instructions."  ?? 2006-2016 [employer]. Care instructions adapted under license by [employer] (which disclaims liability or warranty for this information). This care instruction is for use with your licensed healthcare professional. If you have questions about a medical condition or this instruction, always ask your healthcare professional. Healthwise, Incorporated disclaims any warranty or liability for your use of this information.   Content Version: 11.0.578772; Current as of: August 06, 2014

## 2015-04-24 NOTE — Progress Notes (Signed)
[name] ShutterMonica R Borgen  06/05/1951   Chief Complaint   Patient presents with   ??? Hip Pain     right hip pain        HISTORY OF PRESENT ILLNESS:  [name] ShutterMonica R [name] is a 64 y.o. female who presents today for f/u after having right total hip replacement on 01/29/2015. Pt has mild pain during the day but pain is worse at night. She is walking with a cane. Pt reports decreased ROM in her right hip. Pt is out of pain medication. Pt was recently in the ICU for several days due to high blood pressure. 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 Pt denies fevers, night sweats, or weight loss.       Allergies   Allergen Reactions   ??? Chocolate [Cocoa] Sneezing        Past Medical History   Diagnosis Date   ??? Arthritis    ??? Chronic pain    ??? GERD (gastroesophageal reflux disease)    ??? Hypertension    ??? Liver disease      HEP C   ??? PTSD (post-traumatic stress disorder)      [place]      Social History     Social History   ??? Marital status: MARRIED     Spouse name: N/A   ??? Number of children: N/A   ??? Years of education: N/A     Occupational History   ??? Not on file.     Social History Main Topics   ??? Smoking status: Never Smoker   ??? Smokeless tobacco: Never Used   ??? Alcohol use No   ??? Drug use: No   ??? Sexual activity: No     Other Topics Concern   ??? Not on file     Social History Narrative      Past Surgical History   Procedure Laterality Date   ??? Hx gyn  2010     hysterectomy   ??? Hx endoscopy     ??? 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 Hx hip replacement        Family History   Problem Relation Age of Onset   ??? Hypertension Mother    ??? Cancer Maternal Aunt    ??? Alcohol abuse Maternal Grandfather       Current Outpatient Prescriptions   Medication Sig   ??? lisinopril (PRINIVIL, ZESTRIL) 5 mg tablet Take 1 Tab by mouth daily.   ??? amLODIPine (NORVASC) 10 mg tablet Take 1 Tab by mouth daily.    ??? chlorthalidone (HYGROTEN) 25 mg tablet Take 1 Tab by mouth daily.   ??? cloNIDine HCl (CATAPRES) 0.3 mg tablet Take 1 Tab by mouth two (2) times a day.   ??? metoprolol tartrate (LOPRESSOR) 25 mg tablet Take 1 Tab by mouth two (2) times a day.   ??? diclofenac EC (VOLTAREN) 75 mg EC tablet Take 1 Tab by mouth two (2) times daily (with meals).   ??? acetaminophen (TYLENOL) 325 mg tablet Take 650 mg by mouth every four (4) hours as needed for Pain.   ??? SEROQUEL XR 150 mg sr tablet Take 1 Tab by mouth nightly.     No current facility-administered medications for this visit.      Outpatient Prescriptions Marked as Taking for the 04/24/15 encounter (Office Visit) with Madie RenoUosife M Tanaysia Bhardwaj, MD  Medication Sig Dispense Refill   ??? lisinopril (PRINIVIL, ZESTRIL) 5 mg tablet Take 1 Tab by mouth daily. 30 Tab 1   ??? amLODIPine (NORVASC) 10 mg tablet Take 1 Tab by mouth daily. 30 Tab 1   ??? chlorthalidone (HYGROTEN) 25 mg tablet Take 1 Tab by mouth daily. 30 Tab 1   ??? cloNIDine HCl (CATAPRES) 0.3 mg tablet Take 1 Tab by mouth two (2) times a day. 60 Tab 1   ??? metoprolol tartrate (LOPRESSOR) 25 mg tablet Take 1 Tab by mouth two (2) times a day. 30 Tab 1   ??? diclofenac EC (VOLTAREN) 75 mg EC tablet Take 1 Tab by mouth two (2) times daily (with meals). 60 Tab 0   ??? acetaminophen (TYLENOL) 325 mg tablet Take 650 mg by mouth every four (4) hours as needed for Pain.     ??? SEROQUEL XR 150 mg sr tablet Take 1 Tab by mouth nightly. 30 Tab 5         REVIEW OF SYSTEM:  Review of Systems   Constitutional: Negative.    HENT: Negative for hearing loss.    Eyes: Negative for blurred vision and double vision.   Respiratory: Negative for cough, shortness of breath and wheezing.    Cardiovascular: Negative for chest pain, palpitations and PND.   Gastrointestinal: Negative for abdominal pain, heartburn, nausea and vomiting.   Genitourinary: Negative.    Musculoskeletal: Positive for joint pain. Negative for back pain, falls, myalgias and neck pain.    Neurological: Negative for dizziness, tingling, sensory change and headaches.       PHYSICAL EXAM:   Visit Vitals   ??? BP 128/86 (BP 1 Location: Right arm, BP Patient Position: Sitting)   ??? Pulse 65   ??? Resp 15   ??? Ht  (1.702 m)   ??? Wt 192 lb (87.1 kg)   ??? BMI 30.07 kg/m2       Pain Assessment  04/24/2015   Location of Pain Hip   Location Modifiers Right   Severity of Pain 7   Quality of Pain Throbbing;Sharp;Dull;Aching   Duration of Pain Persistent   Frequency of Pain Constant   Date Pain First Started -   Aggravating Factors Bending;Straightening;Stretching;Exercise;Kneeling;Squatting;Standing;Stairs;Walking   Limiting Behavior Yes   Relieving Factors NSAID   Relieving Factors Comment -   Result of Injury No   Work-Related Injury -   Type of Injury -       Psychiatric: Affect and mood are appropriate.   Physical Exam   Constitutional: She is oriented to person, place, and time and well-developed, well-nourished, and in no distress.   Musculoskeletal: She exhibits edema and tenderness.        Right hip: She exhibits decreased range of motion and tenderness. She exhibits no swelling, no crepitus, no deformity and no laceration.        Lumbar back: She exhibits decreased range of motion, tenderness (right-sided lower back tenderness), bony tenderness and pain. She exhibits no swelling, no edema, no deformity, no laceration, no spasm and normal pulse.   Neurological: She is oriented to person, place, and time. No sensory deficit. She exhibits normal muscle tone.   Skin: Skin is warm and dry.   Psychiatric: Affect and judgment normal.   Vitals reviewed.    Ortho Exam      PLAN & RECOMMENDATION:    ICD-10-CM ICD-9-CM    1. Primary osteoarthritis of both hips M16.0 715.15    2. Status post right  hip replacement Z96.641 V43.64        Extensive discussion was held with the patient regarding the diagnosis, various  treatment options, recovery period, and possible complications including the following   1. Pt was prescribed Percocet.  2. Continue doing home exercises.  3. F/u in 3 months.     DISSCUSSION:  The patient was counseled on personal impression, his/her instructions for follow-up, the importance of compliance with use and treatment options and risks factor reduction.    Ms. Broerman has a reminder for a "due or due soon" health maintenance. I have asked that she contact her primary care provider for follow-up on this health maintenance.    I have discussed the findings of this evaluation with the patient. The discussion included a completed verbal explanation any of changes into examination results,diagnoses and current treatment plan. A scheduled for future care needs was explained. The patient verbalized understanding of these instructions. The patient acknowledged understanding the diagnoses and treatment. If any questions should arise after returning home I have encouraged the patient to feel free to call the office at [phone].The patient understands to call 911 in the case of a true medical emergency.    The patient understands it is their responsibility of the patient to complete all prescribed medications all recommend that testing, including but not limited to, laboratory studies and imaging. The patient further understands the need to keep all scheduled  follow-up visits and to inform the office immediately for any changes in the their medical condition.  The  Patient understands that the success of treatment in large part depends on patient willingness to complete the therapeutic regiment and to work in partnership with designated health care providers.     Patient was reassured; the diagnosis was discussed and all questions were answered to the satisfaction of the patient.      Scribed by Elson Clan (Scribekick) as dictated by Madie Reno, MD.  [facility]

## 2015-04-24 NOTE — Progress Notes (Signed)
Pt is being seen today for post op of right hip. Pt states that she has some pain during the day but worse at night time.       Pt is retired.   [facility]   HM reviewed with pt and informed to follow up with any outdated HM that may need to be done.      Brief history has been reviewed with pt at time of OV.

## 2015-04-27 NOTE — Progress Notes (Signed)
NNTOCIP    Transitions of Care Follow Up      The patient attended a post hospital follow up appointment with Dr. Jac Canavanharakan on 04/24/15.  Lisinopril 5 mg was added for better blood pressure control.  Patient has been referred to neurology for insomnia.  Will continue to follow.

## 2015-05-02 ENCOUNTER — Encounter

## 2015-05-04 MED ORDER — DICLOFENAC 75 MG TAB, DELAYED RELEASE
75 mg | ORAL_TABLET | ORAL | 0 refills | Status: DC
Start: 2015-05-04 — End: 2015-07-07

## 2015-05-04 MED ORDER — SEROQUEL XR 150 MG TABLET,EXTENDED RELEASE
150 mg | ORAL_TABLET | ORAL | 2 refills | Status: DC
Start: 2015-05-04 — End: 2015-09-01

## 2015-05-05 NOTE — Telephone Encounter (Signed)
Spoke with patient to remind her of ordered mammogram.

## 2015-05-12 NOTE — Telephone Encounter (Signed)
Dr. Kathi DerBarot's office was called. They stated they did not receive the referral fax. It was re-faxed to their office and patient has been made aware.

## 2015-05-12 NOTE — Progress Notes (Signed)
NNTOCIP    Transitions of Care Follow Up      [name] [name] was hospitalized at [facility] 10/25-10/28/16 for hypertensive urgency. The patient was discharged to home with [facility].    Reached patient on home number and identified self/role.  [name] confirmed she is taking blood pressure medications as directed.  Patient is waiting to hear about referral to neurology and placed a call to the [place] office today.    Goals met. Episode closed: no hospitalization or ED admission post 30 days from discharge of 04/10/15.

## 2015-05-12 NOTE — Telephone Encounter (Signed)
Pt is calling in regards to a Neuro referral as she has not been contacted for a appointment.

## 2015-05-15 NOTE — Telephone Encounter (Signed)
Patient called stating she never received a call from the neurologist we referred her to. I called Dr. Kathi DerBarot's office and was told to refax the referral. After I refaxed it, I received a call form his office stating patient had an appointment on 05-11-15 and patient called and cancelled. I called patient to relay the information and she thought they were from sleep study only, not neuro. I gave her their number and she is to call back to make an appointment.

## 2015-05-20 ENCOUNTER — Encounter: Attending: Family Medicine | Primary: Physician Assistant

## 2015-05-26 ENCOUNTER — Ambulatory Visit
Admit: 2015-05-26 | Discharge: 2015-05-26 | Payer: MEDICARE | Attending: Orthopaedic Surgery | Primary: Physician Assistant

## 2015-05-26 DIAGNOSIS — Z96641 Presence of right artificial hip joint: Secondary | ICD-10-CM

## 2015-05-26 MED ORDER — OXYCODONE-ACETAMINOPHEN 5 MG-325 MG TAB
5-325 mg | ORAL_TABLET | Freq: Two times a day (BID) | ORAL | 0 refills | Status: DC | PRN
Start: 2015-05-26 — End: 2015-06-11

## 2015-05-26 NOTE — Patient Instructions (Signed)
Back Pain: Care Instructions  Your Care Instructions     Back pain has many possible causes. It is often related to problems with muscles and ligaments of the back. It may also be related to problems with the nerves, discs, or bones of the back. Moving, lifting, standing, sitting, or sleeping in an awkward way can strain the back. Sometimes you don't notice the injury until later. Arthritis is another common cause of back pain.  Although it may hurt a lot, back pain usually improves on its own within several weeks. Most people recover in 12 weeks or less. Using good home treatment and being careful not to stress your back can help you feel better sooner.  Follow-up care is a key part of your treatment and safety. Be sure to make and go to all appointments, and call your doctor if you are having problems. It???s also a good idea to know your test results and keep a list of the medicines you take.  How can you care for yourself at home?  ?? Sit or lie in positions that are most comfortable and reduce your pain. Try one of these positions when you lie down:  ?? Lie on your back with your knees bent and supported by large pillows.  ?? Lie on the floor with your legs on the seat of a sofa or chair.  ?? Lie on your side with your knees and hips bent and a pillow between your legs.  ?? Lie on your stomach if it does not make pain worse.  ?? Do not sit up in bed, and avoid soft couches and twisted positions. Bed rest can help relieve pain at first, but it delays healing. Avoid bed rest after the first day of back pain.  ?? Change positions every 30 minutes. If you must sit for long periods of time, take breaks from sitting. Get up and walk around, or lie in a comfortable position.  ?? Try using a heating pad on a low or medium setting for 15 to 20 minutes every 2 or 3 hours. Try a warm shower in place of one session with the heating pad.  ?? You can also try an ice pack for 10 to 15 minutes every 2 to 3 hours.  Put a thin cloth between the ice pack and your skin.  ?? Take pain medicines exactly as directed.  ?? If the doctor gave you a prescription medicine for pain, take it as prescribed.  ?? If you are not taking a prescription pain medicine, ask your doctor if you can take an over-the-counter medicine.  ?? Take short walks several times a day. You can start with 5 to 10 minutes, 3 or 4 times a day, and work up to longer walks. Walk on level surfaces and avoid hills and stairs until your back is better.  ?? Return to work and other activities as soon as you can. Continued rest without activity is usually not good for your back.  ?? To prevent future back pain, do exercises to stretch and strengthen your back and stomach. Learn how to use good posture, safe lifting techniques, and proper body mechanics.  When should you call for help?  Call your doctor now or seek immediate medical care if:  ?? You have new or worsening numbness in your legs.  ?? You have new or worsening weakness in your legs. (This could make it hard to stand up.)  ?? You lose control of your bladder or bowels.    Watch closely for changes in your health, and be sure to contact your doctor if:  ?? Your pain gets worse.  ?? You are not getting better after 2 weeks.  Where can you learn more?  Go to http://www.healthwise.net/GoodHelpConnections  Enter I594 in the search box to learn more about "Back Pain: Care Instructions."  ?? 2006-2016 Healthwise, Inc[employer]. Care instructions adapted under license by [employer] (which disclaims liability or warranty for this information). This care instruction is for use with your licensed healthcare professional. If you have questions about a medical condition or this instruction, always ask your healthcare professional. Healthwise, Incorporated disclaims any warranty or liability for your use of this information.  Content Version: 11.0.578772; Current as of: Nov 03, 2014
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
## 2015-06-02 ENCOUNTER — Inpatient Hospital Stay: Payer: MEDICARE | Attending: Family Medicine | Primary: Physician Assistant

## 2015-06-11 ENCOUNTER — Encounter

## 2015-06-11 NOTE — Telephone Encounter (Signed)
Last Visit: 05/26/2015 with MD Alfahd   Date of Surgery: 01/29/2015     Next Appointment: referred to spine   Previous Refill Encounters: 05/26/2015 per MD Alfahd #[id number]     Requested Prescriptions     Pending Prescriptions Disp Refills   ??? oxyCODONE-acetaminophen (PERCOCET) 5-325 mg per tablet 40 Tab 0     Sig: Take 1 Tab by mouth every twelve (12) hours as needed for Pain. Max Daily Amount: 2 Tabs.

## 2015-06-12 MED ORDER — OXYCODONE-ACETAMINOPHEN 5 MG-325 MG TAB
5-325 mg | ORAL_TABLET | Freq: Two times a day (BID) | ORAL | 0 refills | Status: DC | PRN
Start: 2015-06-12 — End: 2015-07-08

## 2015-06-12 NOTE — Telephone Encounter (Signed)
Informed pt that she has a prescription available to be picked up at the front desk.

## 2015-06-16 ENCOUNTER — Ambulatory Visit: Admit: 2015-06-16 | Discharge: 2015-06-16 | Payer: MEDICARE | Attending: Primary Care | Primary: Physician Assistant

## 2015-06-16 DIAGNOSIS — J209 Acute bronchitis, unspecified: Secondary | ICD-10-CM

## 2015-06-16 MED ORDER — ALBUTEROL SULFATE HFA 90 MCG/ACTUATION AEROSOL INHALER
90 mcg/actuation | Freq: Four times a day (QID) | RESPIRATORY_TRACT | 0 refills | Status: DC | PRN
Start: 2015-06-16 — End: 2016-08-01

## 2015-06-16 MED ORDER — CODEINE-GUAIFENESIN 10 MG-100 MG/5 ML ORAL LIQUID
100-10 mg/5 mL | Freq: Three times a day (TID) | ORAL | 0 refills | Status: DC | PRN
Start: 2015-06-16 — End: 2015-07-07

## 2015-06-16 MED ORDER — NITROFURANTOIN MACROCRYSTAL 100 MG CAP
100 mg | ORAL_CAPSULE | Freq: Two times a day (BID) | ORAL | 0 refills | Status: AC
Start: 2015-06-16 — End: 2015-06-21

## 2015-06-16 MED ORDER — PREDNISONE 5 MG TABLETS IN A DOSE PACK
5 mg | ORAL_TABLET | ORAL | 0 refills | Status: DC
Start: 2015-06-16 — End: 2015-06-29

## 2015-06-16 NOTE — Patient Instructions (Addendum)
Bronchitis: Care Instructions  Your Care Instructions    Bronchitis is inflammation of the bronchial tubes, which carry air to the lungs. The tubes swell and produce mucus, or phlegm. The mucus and inflamed bronchial tubes make you cough. You may have trouble breathing.  Most cases of bronchitis are caused by viruses like those that cause colds. Antibiotics usually do not help and they may be harmful.  Bronchitis usually develops rapidly and lasts about 2 to 3 weeks in otherwise healthy people.  Follow-up care is a key part of your treatment and safety. Be sure to make and go to all appointments, and call your doctor if you are having problems. It's also a good idea to know your test results and keep a list of the medicines you take.  How can you care for yourself at home?  ?? Take all medicines exactly as prescribed. Call your doctor if you think you are having a problem with your medicine.  ?? Get some extra rest.  ?? Take an over-the-counter pain medicine, such as acetaminophen (Tylenol), ibuprofen (Advil, Motrin), or naproxen (Aleve) to reduce fever and relieve body aches. Read and follow all instructions on the label.  ?? Do not take two or more pain medicines at the same time unless the doctor told you to. Many pain medicines have acetaminophen, which is Tylenol. Too much acetaminophen (Tylenol) can be harmful.  ?? Take an over-the-counter cough medicine that contains dextromethorphan to help quiet a dry, hacking cough so that you can sleep. Avoid cough medicines that have more than one active ingredient. Read and follow all instructions on the label.  ?? Breathe moist air from a humidifier, hot shower, or sink filled with hot water. The heat and moisture will thin mucus so you can cough it out.  ?? Do not smoke. Smoking can make bronchitis worse. If you need help quitting, talk to your doctor about stop-smoking programs and medicines. These can increase your chances of quitting for good.   When should you call for help?  Call 911 anytime you think you may need emergency care. For example, call if:  ?? You have severe trouble breathing.  Call your doctor now or seek immediate medical care if:  ?? You have new or worse trouble breathing.  ?? You cough up dark brown or bloody mucus (sputum).  ?? You have a new or higher fever.  ?? You have a new rash.  Watch closely for changes in your health, and be sure to contact your doctor if:  ?? You cough more deeply or more often, especially if you notice more mucus or a change in the color of your mucus.  ?? You are not getting better as expected.  Where can you learn more?  Go to http://www.healthwise.net/GoodHelpConnections.  Enter H333 in the search box to learn more about "Bronchitis: Care Instructions."  Current as of: Nov 03, 2014  Content Version: 11.1  ?? 2006-2016 [employer], Inc[employer]. Care instructions adapted under license by [employer] (which disclaims liability or warranty for this information). If you have questions about a medical condition or this instruction, always ask your healthcare professional. Healthwise, Inc[employer] disclaims any warranty or liability for your use of this information.

## 2015-06-16 NOTE — Progress Notes (Signed)
Anne ShutterMonica R Nations is a 65 y.o. female here today with c/o a possible UTI and cold symptoms. Patient has productive cough times 2 days. States she can't sleep. Denies fever and ear pain. States her chest hurts from coughing and sneezing. Also complains of SOB. States her urinary problems are clearing up.    Learning assessment previously completed.    1. Have you been to the ER, urgent care clinic or hospitalized since your last visit? no    2. Have you seen or consulted any other health care providers outside of the [facility] since your last visit (Include any pap smears or colon screening)? no     Do you have an Advanced Directive? yes    Would you like information on Advanced Directives? no

## 2015-06-16 NOTE — Progress Notes (Signed)
Progress Note  Today's Date:  06/16/2015   Patient:  [name] [name]  Patient DOB:  03/16/1951    Subjective:   [name] [name] is a 65 y.o. female pt of Dr. Vanice Sarahharaknan who present with c/o Cold and UTI Symptoms. States that URI symptoms started a couple of days ago. States that she has been wheezing, has SOB and cough that is is worse at night. Has not been able to sleep at night. Has a history of Asthma but usually feels this bad when she has bronchitis. Has not felt this bad for a long time. Cough is productive and has post nasal drip. Denies fevers, chills, body ache but c/o chest wall pain form coughing. Also has been having urinary symptoms and states that they are getting better.       Current Outpatient Meds and Allergies     Current Outpatient Prescriptions on File Prior to Visit   Medication Sig Dispense Refill   ??? SEROQUEL XR 150 mg sr tablet TAKE 1 TABLET BY MOUTH NIGHTLY 30 Tab 2   ??? lisinopril (PRINIVIL, ZESTRIL) 5 mg tablet Take 1 Tab by mouth daily. 30 Tab 1   ??? amLODIPine (NORVASC) 10 mg tablet Take 1 Tab by mouth daily. 30 Tab 1   ??? cloNIDine HCl (CATAPRES) 0.3 mg tablet Take 1 Tab by mouth two (2) times a day. 60 Tab 1   ??? metoprolol tartrate (LOPRESSOR) 25 mg tablet Take 1 Tab by mouth two (2) times a day. 30 Tab 1   ??? oxyCODONE-acetaminophen (PERCOCET) 5-325 mg per tablet Take 1 Tab by mouth every twelve (12) hours as needed for Pain. Max Daily Amount: 2 Tabs. 40 Tab 0   ??? gabapentin (NEURONTIN) 300 mg capsule      ??? diclofenac EC (VOLTAREN) 75 mg EC tablet TAKE 1 TABLET BY MOUTH TWICE A DAY TAKE WITH MEALS 60 Tab 0   ??? oxyCODONE-acetaminophen (PERCOCET) 5-325 mg per tablet Take 1 Tab by mouth two (2) times daily as needed for Pain. Max Daily Amount: 2 Tabs. Indications: PAIN 40 Tab 0   ??? chlorthalidone (HYGROTEN) 25 mg tablet Take 1 Tab by mouth daily. 30 Tab 1   ??? acetaminophen (TYLENOL) 325 mg tablet Take 650 mg by mouth every four (4) hours as needed for Pain.        No current facility-administered medications on file prior to visit.      These medications have been reviewed and reconciled with the patient during today's visit.      Allergies   Allergen Reactions   ??? Chocolate [Cocoa] Sneezing       ROS:     CONST:   Denies fatigue, weight change, appetite change   NEURO:   Denies headaches, vision changes, dizziness, loss of consciousness  Ears:  change in hearing, ear pain, ear discharge, ear ringing  Nose:  nosebleeds, sneezing, runny nose, nasal congestion  Mouth/Throat: sore throat, hoarse voice, difficulty swallowing  CV:      Denies chest pain, palpitations, orthopnea, PND  PULM: Denies SOB, wheezing, cough, hemoptysis  GI:             Denies nausea, vomiting, abdominal pain, greasy stools, blood in stool,     diarrhea, constipation  GU:       Denies dysuria, hematuria, change in urine  MS:      Denies muscle/joint pain, joint swelling  SKIN:        Denies rashes, skin changes  ALLERGY: Denies  seasonal allergies, itchy eyes  HEME: Denies easy bleeding/bruising    Objective:     VS:    Visit Vitals   ??? BP (!) 136/94 (BP 1 Location: Left arm, BP Patient Position: Sitting)   ??? Pulse (!) 48   ??? Temp 98.6 ??F (37 ??C) (Oral)   ??? Resp 18   ??? Ht 5\' 7"  (1.702 m)   ??? Wt 195 lb (88.5 kg)   ??? SpO2 96%   ??? BMI 30.54 kg/m2       General:   well-nourished, well-groomed, pleasant, alert, in no acute distress.     Head:  Normocephalic, atraumatic, MMM,   Ears:                Ear canals clear; tympanic membranes without erythema or exudate  Mouth:  MMM, good dentition, oropharynx without exudate, petechiae or ulcersWNL  Cardiovasc:   Regular rate and rhythm, no murmurs, no rubs, no gallops,   Pulmonary:   Clear breath sounds bilaterally, good air movement, no wheezing, no rales, no rhonchi, normal respiratory effort  Abdomen:   Abdomen soft, nontender, nondistended, NABS,   Neuro:   Alert, conversant, appropriate, following commands, no focal deficits.         Assessment:        1. Acute bronchitis, unspecified organism    2. Acute cystitis without hematuria        Plan:       Orders Placed This Encounter   ??? guaiFENesin-codeine (ROBITUSSIN AC) 100-10 mg/5 mL solution     Sig: Take 5 mL by mouth three (3) times daily as needed for Cough. Max Daily Amount: 15 mL. Indications: COUGH     Dispense:  118 mL     Refill:  0   ??? albuterol (PROVENTIL HFA, VENTOLIN HFA, PROAIR HFA) 90 mcg/actuation inhaler     Sig: Take 2 Puffs by inhalation every six (6) hours as needed for Wheezing or Shortness of Breath.     Dispense:  1 Inhaler     Refill:  0   ??? predniSONE (STERAPRED) 5 mg dose pack     Sig: See administration instruction per 5mg  dose pack     Dispense:  21 Tab     Refill:  0   ??? nitrofurantoin (MACRODANTIN) 100 mg capsule     Sig: Take 1 Cap by mouth two (2) times a day for 5 days. Indications: BACTERIAL URINARY TRACT INFECTION     Dispense:  10 Cap     Refill:  0       Stay hydrated  May use OTC pyridium as desired, which will turn urine orange/red color  Follow up: Call or return to clinic prn if these symptoms worsen or fail to improve as anticipated   I have discussed the diagnosis with the patient and the intended plan as seen in the above orders.  The patient has received an after-visit summary along with patient information handout.  I have discussed medication side effects and warnings with the patient as well. Pt verbalized understanding.    Follow-up Disposition:  Return if symptoms worsen or fail to improve, for With Dr. Jac Canavan.  Lorain Childes, FNP  06/16/2015, 1:18 PM

## 2015-06-17 MED ORDER — IPRATROPIUM-ALBUTEROL 2.5 MG-0.5 MG/3 ML NEB SOLUTION
2.5 mg-0.5 mg/3 ml | RESPIRATORY_TRACT | 0 refills | Status: AC
Start: 2015-06-17 — End: 2015-06-16

## 2015-06-28 ENCOUNTER — Encounter

## 2015-06-29 ENCOUNTER — Ambulatory Visit: Admit: 2015-06-29 | Discharge: 2015-06-29 | Payer: MEDICARE | Attending: Family Medicine | Primary: Physician Assistant

## 2015-06-29 DIAGNOSIS — R112 Nausea with vomiting, unspecified: Secondary | ICD-10-CM

## 2015-06-29 LAB — AMB POC URINALYSIS DIP STICK AUTO W/ MICRO
Bilirubin (UA POC): NEGATIVE
Ketones (UA POC): NEGATIVE
Leukocyte esterase (UA POC): NEGATIVE
Nitrites (UA POC): NEGATIVE
Specific gravity (UA POC): 1.015 (ref 1.001–1.035)
Urobilinogen (UA POC): 0.2 (ref 0.2–1)
pH (UA POC): 5 (ref 4.6–8.0)

## 2015-06-29 MED ORDER — ZOLPIDEM 5 MG TAB
5 mg | ORAL_TABLET | Freq: Every evening | ORAL | 0 refills | Status: DC | PRN
Start: 2015-06-29 — End: 2015-07-08

## 2015-06-29 MED ORDER — ONDANSETRON 8 MG TAB, RAPID DISSOLVE
8 mg | ORAL_TABLET | Freq: Three times a day (TID) | ORAL | 0 refills | Status: DC | PRN
Start: 2015-06-29 — End: 2015-07-07

## 2015-06-29 NOTE — Progress Notes (Signed)
[name] [name] is a 65 y.o. female with [name] last night. Started with a cough 2 nights ago. States she is having abdominal pains and also complains of itching feet.     Learning assessment previously completed.    1. Have you been to the ER, urgent care clinic or hospitalized since your last visit? no    2. Have you seen or consulted any other health care providers outside of the [facility] since your last visit (Include any pap smears or colon screening)? no      Do you have an Advanced Directive? yes    Would you like information on Advanced Directives? no

## 2015-06-29 NOTE — Patient Instructions (Addendum)
Please use room temperature liquids.       Nausea and Vomiting: Care Instructions  Your Care Instructions    When you are nauseated, you may feel weak and sweaty and notice a lot of saliva in your mouth. Nausea often leads to vomiting. Most of the time you do not need to worry about nausea and vomiting, but they can be signs of other illnesses.  Two common causes of nausea and vomiting are stomach flu and food poisoning. Nausea and vomiting from viral stomach flu will usually start to improve within 24 hours. Nausea and vomiting from food poisoning may last from 12 to 48 hours.  The doctor has checked you carefully, but problems can develop later. If you notice any problems or new symptoms, get medical treatment right away.  Follow-up care is a key part of your treatment and safety. Be sure to make and go to all appointments, and call your doctor if you are having problems. It's also a good idea to know your test results and keep a list of the medicines you take.  How can you care for yourself at home?  ?? To prevent dehydration, drink plenty of fluids, enough so that your urine is light yellow or clear like water. Choose water and other caffeine-free clear liquids until you feel better. If you have kidney, heart, or liver disease and have to limit fluids, talk with your doctor before you increase the amount of fluids you drink.  ?? Rest in bed until you feel better.  ?? When you are able to eat, try clear soups, mild foods, and liquids until all symptoms are gone for 12 to 48 hours. Other good choices include dry toast, crackers, cooked cereal, and gelatin dessert, such as Jell-O.  When should you call for help?  Call 911 anytime you think you may need emergency care. For example, call if:  ?? You passed out (lost consciousness).  Call your doctor now or seek immediate medical care if:  ?? You have symptoms of dehydration, such as:  ?? Dry eyes and a dry mouth.  ?? Passing only a little dark urine.   ?? Feeling thirstier than usual.  ?? You have new or worsening belly pain.  ?? You have a new or higher fever.  ?? You vomit blood or what looks like coffee grounds.  Watch closely for changes in your health, and be sure to contact your doctor if:  ?? You have ongoing nausea and vomiting.  ?? Your vomiting is getting worse.  ?? Your vomiting lasts longer than 2 days.  ?? You are not getting better as expected.  Where can you learn more?  Go to [employer].  Enter H591 in the search box to learn more about "Nausea and Vomiting: Care Instructions."  Current as of: Nov 07, 2014  Content Version: 11.1  ?? 2006-2016 [employer]. Care instructions adapted under license by [employer] (which disclaims liability or warranty for this information). If you have questions about a medical condition or this instruction, always ask your healthcare professional. [employer] disclaims any warranty or liability for your use of this information.

## 2015-06-29 NOTE — Telephone Encounter (Signed)
Pt request new sleep study referral.

## 2015-06-29 NOTE — Progress Notes (Signed)
Nausea        HPI: [name] [name] is a 65 y.o. female [place]  Here with recurrent nausea and vomiting since yesterday. She denies any fevers, no blood in her vomit.  She is unable to keep anything down and feels dehydrated.  She reports on Saturday that she had a severe cough but that seems to have improved with use of Nyquil. She does report some abdominal pain as well but that seems to have started after the N/V.  She reports her chronic right hip pain has worsened during this time.  She was seen here on 06/15/2014 and diagnosed wth acute cystitis she has completed her course of abx.          Past Medical History   Diagnosis Date   ??? Arthritis    ??? Chronic pain    ??? GERD (gastroesophageal reflux disease)    ??? Hypertension    ??? Liver disease      HEP C   ??? PTSD (post-traumatic stress disorder)      lived through the [place] in [place]       Current Outpatient Prescriptions   Medication Sig   ??? zolpidem (AMBIEN) 5 mg tablet Take 1 Tab by mouth nightly as needed for Sleep. Max Daily Amount: 5 mg.   ??? ondansetron (ZOFRAN ODT) 8 mg disintegrating tablet Take 1 Tab by mouth every eight (8) hours as needed for Nausea.   ??? guaiFENesin-codeine (ROBITUSSIN AC) 100-10 mg/5 mL solution Take 5 mL by mouth three (3) times daily as needed for Cough. Max Daily Amount: 15 mL. Indications: COUGH   ??? albuterol (PROVENTIL HFA, VENTOLIN HFA, PROAIR HFA) 90 mcg/actuation inhaler Take 2 Puffs by inhalation every six (6) hours as needed for Wheezing or Shortness of Breath.   ??? SEROQUEL XR 150 mg sr tablet TAKE 1 TABLET BY MOUTH NIGHTLY   ??? lisinopril (PRINIVIL, ZESTRIL) 5 mg tablet Take 1 Tab by mouth daily.   ??? amLODIPine (NORVASC) 10 mg tablet Take 1 Tab by mouth daily.   ??? cloNIDine HCl (CATAPRES) 0.3 mg tablet Take 1 Tab by mouth two (2) times a day.   ??? metoprolol tartrate (LOPRESSOR) 25 mg tablet Take 1 Tab by mouth two (2) times a day.    ??? oxyCODONE-acetaminophen (PERCOCET) 5-325 mg per tablet Take 1 Tab by mouth every twelve (12) hours as needed for Pain. Max Daily Amount: 2 Tabs.   ??? gabapentin (NEURONTIN) 300 mg capsule    ??? diclofenac EC (VOLTAREN) 75 mg EC tablet TAKE 1 TABLET BY MOUTH TWICE A DAY TAKE WITH MEALS   ??? chlorthalidone (HYGROTEN) 25 mg tablet Take 1 Tab by mouth daily.   ??? acetaminophen (TYLENOL) 325 mg tablet Take 650 mg by mouth every four (4) hours as needed for Pain.     No current facility-administered medications for this visit.        Allergies   Allergen Reactions   ??? Chocolate [Cocoa] Sneezing       Past Surgical History   Procedure Laterality Date   ??? Hx gyn  2010     hysterectomy   ??? Hx endoscopy     ??? 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 Hx hip replacement         Family History   Problem Relation Age of Onset   ??? Hypertension Mother    ??? Cancer Maternal Aunt    ??? Alcohol abuse Maternal Grandfather  Social History     Social History   ??? Marital status: MARRIED     Spouse name: N/A   ??? Number of children: N/A   ??? Years of education: N/A     Occupational History   ??? Not on file.     Social History Main Topics   ??? Smoking status: Never Smoker   ??? Smokeless tobacco: Never Used   ??? Alcohol use No   ??? Drug use: No   ??? Sexual activity: No     Other Topics Concern   ??? Not on file     Social History Narrative       Review of Systems   Constitutional: Negative for chills and fever.   Respiratory: Positive for cough. Negative for shortness of breath.    Cardiovascular: Negative for chest pain.   Gastrointestinal: Positive for abdominal pain, nausea and vomiting. Negative for diarrhea.   Genitourinary: Negative for dysuria, frequency and hematuria.   Musculoskeletal: Positive for joint pain.   Psychiatric/Behavioral: The patient has insomnia (Continued insomnia, hx of OSA).          Visit Vitals   ??? BP (!) 135/98 (BP 1 Location: Right arm, BP Patient Position: Lying left side)   ??? Pulse (!) 54    ??? Temp 99 ??F (37.2 ??C) (Oral)   ??? Resp 20   ??? Ht  (1.702 m)       Physical Exam   Constitutional: She is oriented to person, place, and time. She appears well-developed and well-nourished. She appears ill.   HENT:   Head: Normocephalic and atraumatic.   Right Ear: Hearing, tympanic membrane, external ear and ear canal normal.   Left Ear: Hearing, tympanic membrane, external ear and ear canal normal.   Nose: No mucosal edema or rhinorrhea.   Mouth/Throat: Uvula is midline, oropharynx is clear and moist and mucous membranes are normal. Tonsils are 1+ on the right. Tonsils are 1+ on the left. No tonsillar exudate.   Cardiovascular: Normal rate, regular rhythm and normal heart sounds.    Pulmonary/Chest: Effort normal and breath sounds normal. She has no wheezes. She has no rhonchi. She has no rales.   Abdominal: Soft. Bowel sounds are normal. There is generalized tenderness. There is no guarding and no tenderness at McBurney's point.   Neurological: She is alert and oriented to person, place, and time.   Skin: Skin is warm and dry.   Nursing note and vitals reviewed.    Results for orders placed or performed in visit on 06/29/15   AMB POC URINALYSIS DIP STICK AUTO W/ MICRO   Result Value Ref Range    Color (UA POC) Yellow     Clarity (UA POC) Clear     Glucose (UA POC) 2+ Negative    Bilirubin (UA POC) Negative Negative    Ketones (UA POC) Negative Negative    Specific gravity (UA POC) 1.015 1.001 - 1.035    Blood (UA POC) 2+ Negative    pH (UA POC) 5.0 4.6 - 8.0    Protein (UA POC) 2+ Negative mg/dL    Urobilinogen (UA POC) 0.2 mg/dL 0.2 - 1    Nitrites (UA POC) Negative Negative    Leukocyte esterase (UA POC) Negative Negative         Assesment:  1. Intractable vomiting with nausea, unspecified vomiting type  Most likely sequelae from recent UTI. BRAT diet, zofran prn, continue to monitor.   - AMB POC URINALYSIS DIP STICK  AUTO W/ MICRO  - ondansetron (ZOFRAN ODT) 8 mg disintegrating tablet; Take 1 Tab by mouth  every eight (8) hours as needed for Nausea.  Dispense: 30 Tab; Refill: 0    2. Primary insomnia  She has initial consultation with Dr Tawanna Solo, neurologist in the next 2 weeks  - zolpidem (AMBIEN) 5 mg tablet; Take 1 Tab by mouth nightly as needed for Sleep. Max Daily Amount: 5 mg.  Dispense: 15 Tab; Refill: 0      I have discussed the diagnosis with the patient and the intended management  The patient has received an after-visit summary and questions were answered concerning future plans.  I have discussed medication usage, side effects and warnings with the patient as well.I have reviewed the plan of care with the patient, accepted their input and they are in agreement with the treatment goals.         Follow-up Disposition:  Return if symptoms worsen or fail to improve.      Littie Deeds, MD                .

## 2015-06-29 NOTE — Telephone Encounter (Signed)
Patient's husband states she needs an insurance referral to see Dr. Tawanna Solo. Her [employer] referral has expired.

## 2015-06-30 MED ORDER — PROMETHAZINE 25 MG/ML INJECTION
25 mg/mL | Freq: Once | INTRAMUSCULAR | 0 refills | Status: AC
Start: 2015-06-30 — End: 2015-06-30

## 2015-06-30 MED ORDER — METOCLOPRAMIDE 5 MG/ML SYRINGE
5 mg/mL | Freq: Once | INTRAMUSCULAR | 0 refills | Status: AC
Start: 2015-06-30 — End: 2015-06-30

## 2015-07-01 MED ORDER — LISINOPRIL 5 MG TAB
5 mg | ORAL_TABLET | ORAL | 2 refills | Status: DC
Start: 2015-07-01 — End: 2015-07-08

## 2015-07-01 NOTE — Telephone Encounter (Signed)
Patient called stating she has been having diarrhea since last night with abdominal pains. No fever noted. After speaking with doctor, patient was advised to start Immodium as prescribed on box. Patient agreed to advice.

## 2015-07-02 ENCOUNTER — Emergency Department: Admit: 2015-07-02 | Payer: MEDICARE | Primary: Physician Assistant

## 2015-07-02 ENCOUNTER — Encounter: Attending: Physical Medicine & Rehabilitation | Primary: Physician Assistant

## 2015-07-02 ENCOUNTER — Inpatient Hospital Stay
Admit: 2015-07-02 | Discharge: 2015-07-07 | Disposition: A | Discharge status: Home Health | Payer: MEDICARE | Attending: Internal Medicine | Admitting: Internal Medicine

## 2015-07-02 ENCOUNTER — Inpatient Hospital Stay: Admit: 2015-07-02 | Payer: MEDICARE | Primary: Physician Assistant

## 2015-07-02 DIAGNOSIS — E1165 Type 2 diabetes mellitus with hyperglycemia: Principal | ICD-10-CM

## 2015-07-02 LAB — LIPASE: Lipase: 120 U/L (ref 73–393)

## 2015-07-02 LAB — METABOLIC PANEL, COMPREHENSIVE
ALT (SGPT): 15 U/L (ref 12–78)
AST (SGOT): 7 U/L — ABNORMAL LOW (ref 15–37)
Albumin: 3.6 gm/dl (ref 3.4–5.0)
Alk. phosphatase: 85 U/L (ref 45–117)
BUN: 22 mg/dl (ref 7–25)
Bilirubin, total: 1.4 mg/dl — ABNORMAL HIGH (ref 0.2–1.0)
CO2: 31 mEq/L (ref 21–32)
Calcium: 9.7 mg/dl (ref 8.5–10.1)
Chloride: 89 mEq/L — ABNORMAL LOW (ref 98–107)
Creatinine: 1.4 mg/dl — ABNORMAL HIGH (ref 0.6–1.3)
GFR est AA: 49
GFR est non-AA: 40
Glucose: 499 mg/dl — CR (ref 74–106)
Potassium: 2.8 mEq/L — CL (ref 3.5–5.1)
Protein, total: 8.6 gm/dl — ABNORMAL HIGH (ref 6.4–8.2)
Sodium: 131 mEq/L — ABNORMAL LOW (ref 136–145)

## 2015-07-02 LAB — POC URINE MACROSCOPIC
Bilirubin: NEGATIVE
Glucose: 500 mg/dl — AB
Ketone: 15 mg/dl — AB
Nitrites: NEGATIVE
Protein: NEGATIVE mg/dl
Specific gravity: <=1.005 (ref 1.005–1.030)
Urobilinogen: 0.2 EU/dl (ref 0.0–1.0)
pH (UA): 5.5 (ref 5–9)

## 2015-07-02 LAB — CBC WITH AUTOMATED DIFF
BASOPHILS: 0.2 % (ref 0–3)
EOSINOPHILS: 0.1 % (ref 0–5)
HCT: 44.4 % (ref 37.0–50.0)
HGB: 15.6 gm/dl (ref 13.0–17.2)
IMMATURE GRANULOCYTES: 0.3 % (ref 0.0–3.0)
LYMPHOCYTES: 19.2 % — ABNORMAL LOW (ref 28–48)
MCH: 28.5 pg (ref 25.4–34.6)
MCHC: 35.1 gm/dl (ref 30.0–36.0)
MCV: 81 fL (ref 80.0–98.0)
MONOCYTES: 6.6 % (ref 1–13)
MPV: 12 fL — ABNORMAL HIGH (ref 6.0–10.0)
NEUTROPHILS: 73.6 % — ABNORMAL HIGH (ref 34–64)
NRBC: 0 (ref 0–0)
PLATELET: 263 10*3/uL (ref 140–450)
RBC: 5.48 M/uL — ABNORMAL HIGH (ref 3.60–5.20)
RDW-SD: 38.6 (ref 36.4–46.3)
WBC: 20.3 10*3/uL — ABNORMAL HIGH (ref 4.0–11.0)

## 2015-07-02 LAB — EKG, 12 LEAD, INITIAL
Atrial Rate: 117 {beats}/min
Calculated P Axis: 66 degrees
Calculated R Axis: 2 degrees
Calculated T Axis: 56 degrees
P-R Interval: 142 ms
Q-T Interval: 354 ms
QRS Duration: 76 ms
QTC Calculation (Bezet): 493 ms
Ventricular Rate: 117 {beats}/min

## 2015-07-02 LAB — GLUCOSE, POC
Glucose (POC): 493 mg/dL — CR (ref 65–105)
Glucose (POC): >500 mg/dL — CR (ref 65–105)
Glucose (POC): 345 mg/dL — ABNORMAL HIGH (ref 65–105)
Glucose (POC): 138 mg/dL — ABNORMAL HIGH (ref 65–105)
Glucose (POC): 269 mg/dL — ABNORMAL HIGH (ref 65–105)

## 2015-07-02 LAB — MAGNESIUM: Magnesium: 2.1 mg/dl (ref 1.8–2.4)

## 2015-07-02 LAB — POC BLOOD GAS
BASE EXCESS: 14 mmol/L — ABNORMAL HIGH (ref <=3)
BICARBONATE: 37.1 mmol/L — ABNORMAL HIGH (ref 22.0–26.0)
CO2, TOTAL: 39 mmol/L — ABNORMAL HIGH (ref 21–32)
PCO2: 50.2 mm Hg (ref 41.0–51.0)
pH: 7.477 — ABNORMAL HIGH (ref 7.310–7.410)

## 2015-07-02 LAB — POC URINE MICROSCOPIC

## 2015-07-02 LAB — HEMOGLOBIN A1C W/O EAG: Hemoglobin A1c: 8.4 % — ABNORMAL HIGH (ref 4.8–6.0)

## 2015-07-02 MED ORDER — POTASSIUM CHLORIDE 10 MEQ/100 ML IV PIGGY BACK
10 mEq/0 mL | Freq: Once | INTRAVENOUS | Status: DC
Start: 2015-07-02 — End: 2015-07-05
  Administered 2015-07-02: 23:00:00 via INTRAVENOUS

## 2015-07-02 MED ORDER — POTASSIUM CHLORIDE 10 MEQ/100 ML IV PIGGY BACK
10 mEq/0 mL | Freq: Once | INTRAVENOUS | Status: AC
Start: 2015-07-02 — End: 2015-07-05
  Administered 2015-07-02: 22:00:00 via INTRAVENOUS

## 2015-07-02 MED ORDER — SODIUM CHLORIDE 0.9 % IJ SYRG
Freq: Once | INTRAMUSCULAR | Status: AC
Start: 2015-07-02 — End: 2015-07-02
  Administered 2015-07-02: 16:00:00 via INTRAVENOUS

## 2015-07-02 MED ORDER — SODIUM CHLORIDE 0.9% BOLUS IV
0.9 % | INTRAVENOUS | Status: AC
Start: 2015-07-02 — End: 2015-07-05
  Administered 2015-07-02: 16:00:00 via INTRAVENOUS

## 2015-07-02 MED ORDER — PROMETHAZINE IN NS 25 MG/50 ML IV PIGGY BAG
25 mg/50 ml | INTRAVENOUS | Status: AC
Start: 2015-07-02 — End: 2015-07-05
  Administered 2015-07-02: 16:00:00 via INTRAVENOUS

## 2015-07-02 MED ORDER — GLUCAGON 1 MG INJECTION
1 mg | INTRAMUSCULAR | Status: DC | PRN
Start: 2015-07-02 — End: 2015-07-07

## 2015-07-02 MED ORDER — METRONIDAZOLE IN SODIUM CHLORIDE (ISO-OSM) 500 MG/100 ML IV PIGGY BACK
500 mg/100 mL | INTRAVENOUS | Status: AC
Start: 2015-07-02 — End: 2015-07-05
  Administered 2015-07-02: 19:00:00 via INTRAVENOUS

## 2015-07-02 MED ORDER — NALOXONE 0.4 MG/ML INJECTION
0.4 mg/mL | INTRAMUSCULAR | Status: DC | PRN
Start: 2015-07-02 — End: 2015-07-07

## 2015-07-02 MED ORDER — INSULIN REGULAR HUMAN 100 UNIT/ML INJECTION
100 unit/mL | Freq: Once | INTRAMUSCULAR | Status: AC
Start: 2015-07-02 — End: 2015-07-02
  Administered 2015-07-02: 20:00:00 via INTRAVENOUS

## 2015-07-02 MED ORDER — CLONIDINE 0.2 MG TAB
0.2 mg | Freq: Two times a day (BID) | ORAL | Status: DC
Start: 2015-07-02 — End: 2015-07-07
  Administered 2015-07-03 – 2015-07-07 (×9): via ORAL

## 2015-07-02 MED ORDER — INSULIN REGULAR HUMAN 100 UNIT/ML INJECTION
100 unit/mL | INTRAMUSCULAR | Status: DC
Start: 2015-07-02 — End: 2015-07-02
  Administered 2015-07-02 (×2): via INTRAVENOUS

## 2015-07-02 MED ORDER — CIPROFLOXACIN IN D5W 400 MG/200 ML IV PIGGY BACK
400 mg/200 mL | Freq: Two times a day (BID) | INTRAVENOUS | Status: DC
Start: 2015-07-02 — End: 2015-07-02

## 2015-07-02 MED ORDER — QUETIAPINE SR 50 MG 24 HR TAB
50 mg | Freq: Every evening | ORAL | Status: DC
Start: 2015-07-02 — End: 2015-07-07
  Administered 2015-07-03 – 2015-07-07 (×4): via ORAL

## 2015-07-02 MED ORDER — METRONIDAZOLE IN SODIUM CHLORIDE (ISO-OSM) 500 MG/100 ML IV PIGGY BACK
500 mg/100 mL | Freq: Three times a day (TID) | INTRAVENOUS | Status: DC
Start: 2015-07-02 — End: 2015-07-02
  Administered 2015-07-02: 21:00:00 via INTRAVENOUS

## 2015-07-02 MED ORDER — MORPHINE 2 MG/ML INJECTION
2 mg/mL | INTRAMUSCULAR | Status: DC | PRN
Start: 2015-07-02 — End: 2015-07-07
  Administered 2015-07-02 – 2015-07-07 (×16): via INTRAVENOUS

## 2015-07-02 MED ORDER — INSULIN LISPRO 100 UNIT/ML INJECTION
100 unit/mL | Freq: Four times a day (QID) | SUBCUTANEOUS | Status: DC
Start: 2015-07-02 — End: 2015-07-02

## 2015-07-02 MED ORDER — CIPROFLOXACIN IN D5W 400 MG/200 ML IV PIGGY BACK
400 mg/200 mL | INTRAVENOUS | Status: DC
Start: 2015-07-02 — End: 2015-07-02

## 2015-07-02 MED ORDER — AMLODIPINE 5 MG TAB
5 mg | Freq: Every day | ORAL | Status: DC
Start: 2015-07-02 — End: 2015-07-04
  Administered 2015-07-03 – 2015-07-04 (×2): via ORAL

## 2015-07-02 MED ORDER — OXYCODONE-ACETAMINOPHEN 5 MG-325 MG TAB
5-325 mg | ORAL | Status: DC | PRN
Start: 2015-07-02 — End: 2015-07-07
  Administered 2015-07-03 – 2015-07-07 (×12): via ORAL

## 2015-07-02 MED ORDER — LISINOPRIL 5 MG TAB
5 mg | Freq: Every day | ORAL | Status: DC
Start: 2015-07-02 — End: 2015-07-04
  Administered 2015-07-03 – 2015-07-04 (×2): via ORAL

## 2015-07-02 MED ORDER — METOPROLOL TARTRATE 25 MG TAB
25 mg | Freq: Two times a day (BID) | ORAL | Status: DC
Start: 2015-07-02 — End: 2015-07-04
  Administered 2015-07-03 – 2015-07-04 (×3): via ORAL

## 2015-07-02 MED ORDER — DEXTROSE 50% IN WATER (D50W) IV SYRG
INTRAVENOUS | Status: DC | PRN
Start: 2015-07-02 — End: 2015-07-07

## 2015-07-02 MED ORDER — SODIUM CHLORIDE 0.9 % IJ SYRG
Freq: Three times a day (TID) | INTRAMUSCULAR | Status: DC
Start: 2015-07-02 — End: 2015-07-07
  Administered 2015-07-02 – 2015-07-07 (×14): via INTRAVENOUS

## 2015-07-02 MED ORDER — SODIUM CHLORIDE 0.9% BOLUS IV
0.9 % | INTRAVENOUS | Status: AC
Start: 2015-07-02 — End: 2015-07-05
  Administered 2015-07-02: 20:00:00 via INTRAVENOUS

## 2015-07-02 MED ORDER — HEPARIN (PORCINE) 5,000 UNIT/ML IJ SOLN
5000 unit/mL | Freq: Three times a day (TID) | INTRAMUSCULAR | Status: DC
Start: 2015-07-02 — End: 2015-07-06
  Administered 2015-07-03 – 2015-07-05 (×8): via SUBCUTANEOUS

## 2015-07-02 MED ORDER — ACETAMINOPHEN 325 MG TABLET
325 mg | ORAL | Status: DC | PRN
Start: 2015-07-02 — End: 2015-07-07

## 2015-07-02 MED ORDER — POTASSIUM CHLORIDE 10 MEQ/100 ML IV PIGGY BACK
10 mEq/0 mL | INTRAVENOUS | Status: DC
Start: 2015-07-02 — End: 2015-07-02
  Administered 2015-07-02: via INTRAVENOUS

## 2015-07-02 MED ORDER — ALBUTEROL SULFATE 2.5 MG/0.5 ML NEB SOLUTION
2.5 mg/0.5 mL | RESPIRATORY_TRACT | Status: DC | PRN
Start: 2015-07-02 — End: 2015-07-07
  Administered 2015-07-03: 13:00:00 via RESPIRATORY_TRACT

## 2015-07-02 MED ORDER — CIPROFLOXACIN IN D5W 400 MG/200 ML IV PIGGY BACK
400 mg/200 mL | Freq: Two times a day (BID) | INTRAVENOUS | Status: DC
Start: 2015-07-02 — End: 2015-07-04
  Administered 2015-07-02 – 2015-07-04 (×4): via INTRAVENOUS

## 2015-07-02 MED ORDER — METRONIDAZOLE IN SODIUM CHLORIDE (ISO-OSM) 500 MG/100 ML IV PIGGY BACK
500 mg/100 mL | Freq: Three times a day (TID) | INTRAVENOUS | Status: DC
Start: 2015-07-02 — End: 2015-07-02

## 2015-07-02 MED ORDER — METRONIDAZOLE IN SODIUM CHLORIDE (ISO-OSM) 500 MG/100 ML IV PIGGY BACK
500 mg/100 mL | Freq: Four times a day (QID) | INTRAVENOUS | Status: DC
Start: 2015-07-02 — End: 2015-07-04
  Administered 2015-07-03 – 2015-07-04 (×7): via INTRAVENOUS

## 2015-07-02 MED ORDER — INSULIN GLARGINE 100 UNIT/ML INJECTION
100 unit/mL | SUBCUTANEOUS | Status: DC
Start: 2015-07-02 — End: 2015-07-02
  Administered 2015-07-02: via SUBCUTANEOUS

## 2015-07-02 MED ORDER — ZOLPIDEM 5 MG TAB
5 mg | Freq: Every evening | ORAL | Status: DC | PRN
Start: 2015-07-02 — End: 2015-07-07
  Administered 2015-07-06 – 2015-07-07 (×2): via ORAL

## 2015-07-02 MED ORDER — MORPHINE 4 MG/ML SYRINGE
4 mg/mL | INTRAMUSCULAR | Status: AC
Start: 2015-07-02 — End: 2015-07-02
  Administered 2015-07-02: 17:00:00 via INTRAVENOUS

## 2015-07-02 MED ORDER — SODIUM CHLORIDE 0.9 % IJ SYRG
INTRAMUSCULAR | Status: DC | PRN
Start: 2015-07-02 — End: 2015-07-07
  Administered 2015-07-05 (×2): via INTRAVENOUS

## 2015-07-02 MED ORDER — INSULIN LISPRO 100 UNIT/ML INJECTION
100 unit/mL | Freq: Three times a day (TID) | SUBCUTANEOUS | Status: DC
Start: 2015-07-02 — End: 2015-07-02
  Administered 2015-07-02: via SUBCUTANEOUS

## 2015-07-02 MED ORDER — SODIUM CHLORIDE 0.9 % IV
INTRAVENOUS | Status: AC
Start: 2015-07-02 — End: 2015-07-02
  Administered 2015-07-02: 22:00:00 via INTRAVENOUS

## 2015-07-02 MED ORDER — IOPAMIDOL 76 % IV SOLN
370 mg iodine /mL (76 %) | Freq: Once | INTRAVENOUS | Status: AC
Start: 2015-07-02 — End: 2015-07-02
  Administered 2015-07-02: 18:00:00 via INTRAVENOUS

## 2015-07-02 MED FILL — POTASSIUM CHLORIDE 10 MEQ/100 ML IV PIGGY BACK: 10 mEq/0 mL | INTRAVENOUS | Qty: 100

## 2015-07-02 MED FILL — POTASSIUM CHLORIDE 10 MEQ/100 ML IV PIGGY BACK: 10 mEq/0 mL | INTRAVENOUS | Qty: 400

## 2015-07-02 MED FILL — PROMETHAZINE IN NS 25 MG/50 ML IV PIGGY BAG: 25 mg/50 ml | INTRAVENOUS | Qty: 50

## 2015-07-02 MED FILL — INSULIN REGULAR HUMAN 100 UNIT/ML INJECTION: 100 unit/mL | INTRAMUSCULAR | Qty: 1

## 2015-07-02 MED FILL — MORPHINE 2 MG/ML INJECTION: 2 mg/mL | INTRAMUSCULAR | Qty: 1

## 2015-07-02 MED FILL — METRONIDAZOLE IN SODIUM CHLORIDE (ISO-OSM) 500 MG/100 ML IV PIGGY BACK: 500 mg/100 mL | INTRAVENOUS | Qty: 100

## 2015-07-02 MED FILL — MORPHINE 4 MG/ML SYRINGE: 4 mg/mL | INTRAMUSCULAR | Qty: 1

## 2015-07-02 MED FILL — SODIUM CHLORIDE 0.9 % IV: INTRAVENOUS | Qty: 1000

## 2015-07-02 MED FILL — ISOVUE-370  76 % INTRAVENOUS SOLUTION: 370 mg iodine /mL (76 %) | INTRAVENOUS | Qty: 70

## 2015-07-02 MED FILL — CIPROFLOXACIN IN D5W 400 MG/200 ML IV PIGGY BACK: 400 mg/200 mL | INTRAVENOUS | Qty: 200

## 2015-07-02 NOTE — Other (Addendum)
TRANSFER - IN REPORT:    Verbal report received from Jiles Prows RN , ED Nurse(name) on [name] [name]  being received from ED(unit) for routine progression of care      Report consisted of patient???s Situation, Background, Assessment and   Recommendations(SBAR).     Information from the following report(s) Kardex, ED Summary, MAR and Recent Results was reviewed with the receiving nurse.    Opportunity for questions and clarification was provided.      Assessment completed upon patient???s arrival to unit and care assumed.

## 2015-07-02 NOTE — ED Provider Notes (Addendum)
[facility] [facility]  Emergency Department Treatment Report    Patient: [name] [name] Age: 65 y.o. Sex: female    Date of Birth: Aug 15, 1950 Admit Date: 07/02/2015 PCP: Lake Bells, MD   MRN: [id number]  CSN: [id number]     Room: ER28/ER28 Time Dictated: 11:36 AM        Chief Complaint   Chief Complaint   Patient presents with   ??? Nausea   ??? Vomiting   ??? Diarrhea   ??? Abdominal Pain       History of Present Illness   65 y.o. female presents to the emergency department waiting of abdominal pain and nausea and vomiting for the past 2 days. She states that she has vomited unknown amount of times but has been pretty severe. She admits to severe dehydration and she has not been able to urinate and feels like she is extremely thirsty. She states that the pain is a 8 out of 10 located in her left lower quadrant that wraps around to her right lower quadrant. SHe states that she went to her primary care doctor 2 days ago and was given Zofran with minimal relief. SHe denies any frequent diarrhea. Patient states her only medical history is hypertension, hepatitis, and GERD. Patient denies any fever or shaking or chills. SHe denies any chest pain or palpitations.    Review of Systems   Review of Systems   Constitutional: Negative for chills and fever.   HENT: Negative for congestion.    Eyes: Negative for blurred vision.   Respiratory: Negative for cough and shortness of breath.    Cardiovascular: Negative for chest pain and palpitations.   Gastrointestinal: Positive for abdominal pain, nausea and vomiting. Negative for heartburn.   Genitourinary: Negative for dysuria, frequency and urgency.   Musculoskeletal: Negative for back pain and neck pain.   Skin: Negative for itching and rash.   Neurological: Negative for dizziness and weakness.   Endo/Heme/Allergies: Does not bruise/bleed easily.   Psychiatric/Behavioral: Negative for depression, substance abuse and suicidal ideas.       Past Medical/Surgical History      Past Medical History   Diagnosis Date   ??? Arthritis    ??? Chronic pain    ??? GERD (gastroesophageal reflux disease)    ??? Hypertension    ??? Liver disease      HEP C   ??? PTSD (post-traumatic stress disorder)      lived through the [place] in 1993     Past Surgical History   Procedure Laterality Date   ??? Hx gyn  2010     hysterectomy   ??? Hx endoscopy     ??? 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 Hx hip replacement         Social History     Social History     Social History   ??? Marital status: MARRIED     Spouse name: N/A   ??? Number of children: N/A   ??? Years of education: N/A     Social History Main Topics   ??? Smoking status: Never Smoker   ??? Smokeless tobacco: Never Used   ??? Alcohol use No   ??? Drug use: No   ??? Sexual activity: No     Other Topics Concern   ??? Not on file     Social History Narrative       Family History     Family History   Problem  Relation Age of Onset   ??? Hypertension Mother    ??? Cancer Maternal Aunt    ??? Alcohol abuse Maternal Grandfather        Current Medications     Prior to Admission Medications   Prescriptions Last Dose Informant Patient Reported? Taking?   SEROQUEL XR 150 mg sr tablet Unknown at Unknown time  No No   Sig: TAKE 1 TABLET BY MOUTH NIGHTLY   acetaminophen (TYLENOL) 325 mg tablet Unknown at Unknown time  Yes No   Sig: Take 650 mg by mouth every four (4) hours as needed for Pain.   albuterol (PROVENTIL HFA, VENTOLIN HFA, PROAIR HFA) 90 mcg/actuation inhaler Unknown at Unknown time  No No   Sig: Take 2 Puffs by inhalation every six (6) hours as needed for Wheezing or Shortness of Breath.   amLODIPine (NORVASC) 10 mg tablet Unknown at Unknown time  No No   Sig: Take 1 Tab by mouth daily.   chlorthalidone (HYGROTEN) 25 mg tablet Unknown at Unknown time  No No   Sig: Take 1 Tab by mouth daily.   cloNIDine HCl (CATAPRES) 0.3 mg tablet Unknown at Unknown time  No No   Sig: Take 1 Tab by mouth two (2) times a day.    diclofenac EC (VOLTAREN) 75 mg EC tablet Unknown at Unknown time  No No   Sig: TAKE 1 TABLET BY MOUTH TWICE A DAY TAKE WITH MEALS   gabapentin (NEURONTIN) 300 mg capsule Unknown at Unknown time  Yes No   guaiFENesin-codeine (ROBITUSSIN AC) 100-10 mg/5 mL solution Unknown at Unknown time  No No   Sig: Take 5 mL by mouth three (3) times daily as needed for Cough. Max Daily Amount: 15 mL. Indications: COUGH   lisinopril (PRINIVIL, ZESTRIL) 5 mg tablet Unknown at Unknown time  No No   Sig: TAKE ONE TABLET BY MOUTH ONCE DAILY   metoprolol tartrate (LOPRESSOR) 25 mg tablet Unknown at Unknown time  No No   Sig: Take 1 Tab by mouth two (2) times a day.   ondansetron (ZOFRAN ODT) 8 mg disintegrating tablet 07/01/2015 at Unknown time  No Yes   Sig: Take 1 Tab by mouth every eight (8) hours as needed for Nausea.   oxyCODONE-acetaminophen (PERCOCET) 5-325 mg per tablet Unknown at Unknown time  No No   Sig: Take 1 Tab by mouth every twelve (12) hours as needed for Pain. Max Daily Amount: 2 Tabs.   zolpidem (AMBIEN) 5 mg tablet Unknown at Unknown time  No No   Sig: Take 1 Tab by mouth nightly as needed for Sleep. Max Daily Amount: 5 mg.      Facility-Administered Medications: None       Allergies     Allergies   Allergen Reactions   ??? Chocolate [Cocoa] Sneezing       Physical Exam   ED Triage Vitals   Enc Vitals Group      BP 07/02/15 0948 130/97      Pulse (Heart Rate) 07/02/15 0948 125      Resp Rate 07/02/15 0948 18      Temp 07/02/15 0948 98.3 ??F (36.8 ??C)      Temp src --       SpO2 --       Weight 07/02/15 0944 180 lb      Height 07/02/15 0944 5' 7"      Physical Exam   Constitutional: She is oriented to person, place, and time.  Patient appears dehydrated  SHe is crying due to pain and sick of vomiting   HENT:   Head: Normocephalic.   Eyes: Conjunctivae are normal.   Neck: Normal range of motion.   Cardiovascular: Regular rhythm, normal heart sounds and intact distal pulses.    Tachycardic    Pulmonary/Chest: Effort normal and breath sounds normal. No respiratory distress. She has no wheezes.   Abdominal: Soft. There is tenderness.   Tender bilateral lower quadrants worse on left   Musculoskeletal: Normal range of motion. She exhibits no edema.   Neurological: She is alert and oriented to person, place, and time.   Skin: Skin is warm and dry. No erythema.   Psychiatric: Affect normal.       Impression and Management Plan   Check basic laboratory values to evaluate for any high white blood cell count, severe dehydration or left leg abnormalities. We'll check a urinalysis to evaluate for any urinary tract infection. We'll most likely do a CT scan if elevated white blood cell to check for diverticulitis due to her pain in her bilateral lower quadrants. Patient given IV Reglan and IV normal saline in the emergency department we'll also give the patient IV morphine for pain.    Procedures      Diagnostic Studies   Lab:   Recent Results (from the past 12 hour(s))   CBC WITH AUTOMATED DIFF    Collection Time: 07/02/15 11:01 AM   Result Value Ref Range    WBC 20.3 (H) 4.0 - 11.0 1000/mm3    RBC 5.48 (H) 3.60 - 5.20 M/uL    HGB 15.6 13.0 - 17.2 gm/dl    HCT 44.4 37.0 - 50.0 %    MCV 81.0 80.0 - 98.0 fL    MCH 28.5 25.4 - 34.6 pg    MCHC 35.1 30.0 - 36.0 gm/dl    PLATELET 263 140 - 450 1000/mm3    MPV 12.0 (H) 6.0 - 10.0 fL    RDW-SD 38.6 36.4 - 46.3      NRBC 0 0 - 0      IMMATURE GRANULOCYTES 0.3 0.0 - 3.0 %    NEUTROPHILS 73.6 (H) 34 - 64 %    LYMPHOCYTES 19.2 (L) 28 - 48 %    MONOCYTES 6.6 1 - 13 %    EOSINOPHILS 0.1 0 - 5 %    BASOPHILS 0.2 0 - 3 %   METABOLIC PANEL, COMPREHENSIVE    Collection Time: 07/02/15 11:01 AM   Result Value Ref Range    Sodium 131 (L) 136 - 145 mEq/L    Potassium 2.8 (LL) 3.5 - 5.1 mEq/L    Chloride 89 (L) 98 - 107 mEq/L    CO2 31 21 - 32 mEq/L    Glucose 499 (HH) 74 - 106 mg/dl    BUN 22 7 - 25 mg/dl    Creatinine 1.4 (H) 0.6 - 1.3 mg/dl    GFR est AA 49.0      GFR est non-AA 40       Calcium 9.7 8.5 - 10.1 mg/dl    AST 7 (L) 15 - 37 U/L    ALT 15 12 - 78 U/L    Alk. phosphatase 85 45 - 117 U/L    Bilirubin, total 1.4 (H) 0.2 - 1.0 mg/dl    Protein, total 8.6 (H) 6.4 - 8.2 gm/dl    Albumin 3.6 3.4 - 5.0 gm/dl   LIPASE    Collection Time: 07/02/15 11:01 AM  Result Value Ref Range    Lipase 120 73 - 393 U/L   POC URINE MACROSCOPIC    Collection Time: 07/02/15 11:03 AM   Result Value Ref Range    Glucose 500 (A) NEGATIVE,Negative mg/dl    Bilirubin Negative NEGATIVE,Negative      Ketone 15 (A) NEGATIVE,Negative mg/dl    Specific gravity <=1.005 1.005 - 1.030      Blood Small (A) NEGATIVE,Negative      pH (UA) 5.5 5 - 9      Protein Negative NEGATIVE,Negative mg/dl    Urobilinogen 0.2 0.0 - 1.0 EU/dl    Nitrites Negative NEGATIVE,Negative      Leukocyte Esterase Trace (A) NEGATIVE,Negative      Color Yellow      Appearance Slightly Cloudy     POC URINE MICROSCOPIC    Collection Time: 07/02/15 11:03 AM   Result Value Ref Range    EPITHELIAL CELLS, SQUAMOUS 5-9 /LPF    WBC 1-4 /HPF    RBC 1-4 /HPF    Bacteria OCCASIONAL /HPF   GLUCOSE, POC    Collection Time: 07/02/15 11:14 AM   Result Value Ref Range    Glucose (POC) >500 (HH) 65 - 105 mg/dL   GLUCOSE, POC    Collection Time: 07/02/15 11:16 AM   Result Value Ref Range    Glucose (POC) 493 (HH) 65 - 105 mg/dL   POC BLOOD GAS    Collection Time: 07/02/15 12:03 PM   Result Value Ref Range    pH 7.477 (H) 7.310 - 7.410      PCO2 50.2 41.0 - 51.0 mm Hg    PO2 NO VALUE 35 - 40 mm Hg    BICARBONATE 37.1 (H) 22.0 - 26.0 mmol/L    O2 SAT NO VALUE 70 - 75 %    CO2, TOTAL 39 (H) 21 - 32 mmol/L    BASE EXCESS 14 (H) -2 - 3 mmol/L    Sample type VEN         Imaging:    Ct Abd Pelv W Cont    Result Date: 07/02/2015  CT abdomen and pelvis with intravenous contrast. Comparison 11/18/2014. HISTORY: Vomiting and diarrhea. TECHNIQUE: Exam with nonionic intravenous contrast. FINDINGS: Benign-appearing calcification within the left breast.  Bullous changes are seen in the upper lobes. And left lower lobe. There is a well-circumscribed mass identified in the left lower lobe image 3 series 4 measuring 1.9 cm. Not present on the previous examination. Liver, gallbladder and spleen normal. Stomach unremarkable. Normal pancreas. No adrenal masses. Small cyst within the right kidney. Tiny nonobstructing stones in the right kidney. Tiny nonobstructing stones in the left kidney. Small left renal cysts. Abdominal aorta normal in size. Normal-appearing appendix. Bowel gas pattern shows thickening of the colon from the splenic flexure down to the sigmoid colon. Most likely compatible with colitis. There are scattered diverticula identified within the sigmoid colon. Favor colitis over diverticulitis. Right hip prosthesis. Bladder outline normal. Lower facet arthropathy.     IMPRESSION: 1. Left-sided colitis. 2. Sigmoid colon diverticulosis. 3. There is a mass identified within the left lower lobe measuring 1.9 cm not present on previous study. Dedicated chest CT recommended for further evaluation. 4. Bullous disease within the lungs. 5. Bilateral nonobstructing renal stones. 6. Benign-appearing calcification within the left breast. 7. Right hip prosthesis     Ct Abd Pelv W Cont    Result Date: 07/02/2015  CT abdomen and pelvis with intravenous contrast. Comparison 11/18/2014. HISTORY: Vomiting  and diarrhea. TECHNIQUE: Exam with nonionic intravenous contrast. FINDINGS: Benign-appearing calcification within the left breast. Bullous changes are seen in the upper lobes. And left lower lobe. There is a well-circumscribed mass identified in the left lower lobe image 3 series 4 measuring 1.9 cm. Not present on the previous examination. Liver, gallbladder and spleen normal. Stomach unremarkable. Normal pancreas. No adrenal masses. Small cyst within the right kidney. Tiny nonobstructing stones in the right kidney. Tiny nonobstructing stones in the left kidney.  Small left renal cysts. Abdominal aorta normal in size. Normal-appearing appendix. Bowel gas pattern shows thickening of the colon from the splenic flexure down to the sigmoid colon. Most likely compatible with colitis. There are scattered diverticula identified within the sigmoid colon. Favor colitis over diverticulitis. Right hip prosthesis. Bladder outline normal. Lower facet arthropathy.     IMPRESSION: 1. Left-sided colitis. 2. Sigmoid colon diverticulosis. 3. There is a mass identified within the left lower lobe measuring 1.9 cm not present on previous study. Dedicated chest CT recommended for further evaluation. 4. Bullous disease within the lungs. 5. Bilateral nonobstructing renal stones. 6. Benign-appearing calcification within the left breast. 7. Right hip prosthesis       Medical Decision Making/ED Course   Patient developed no further distress during her stay in the emergency department. Her urinalysis showed greater than 500 glucose although she denies any diagnosis of diabetes. A POC glucose was checked indicating 493. . The patient is a new diabetic and she is not diagnosed diabetes prior to today. Check a VBG to evaluate for acidosis. CT scan of her abdomen was ordered due to a white blood cell count of 20 and lower quadrant abdominal pain. The CT showed left-sided colitis, diverticulosis, and a mass left lower lobe that was not present on a previous study. Patient will be started on IV insulin and admitted to the hospital for diabetic ketoacidosis and new onset of diabetes. Patient was informed of the plan of care and is offering no questions or concerns at this time. Call placed to a [facility] hospitalist for admission.    Continue by Dr. Geradine Girt:   I interviewed and examined the patient. I discussed with the mid-level provider agree with her evaluation and plan as documented here. Abdomen is tender left lower quadrant no guarding or rebound are noted. I have  started IV ciprofloxacin and IV Flagyl. I have spoken to [employer] and gastrology will consult. I spoke to Dr. Winona Legato he'll admit the patient to CDU. Insulin drip was started.  Final Diagnosis    #1 acute hypoglycemia  #2 new-onset diabetes  #3 acute colitis  Disposition   Admitted to stepdown unit in stable condition under Dr. Winona Legato.      The patient was personally evaluated by myself and Dr. Almyra Brace who agrees with the above assessment and plan.    Dragon medical dictation software was used for portions of this report. Unintended errors may occur.     Oneita Hurt, MD  July 02, 2015      My signature above authenticates this document and my orders, the final ??  diagnosis (es), discharge prescription (s), and instructions in the Epic ??  record.  If you have any questions please contact [phone].  ??  Nursing notes have been reviewed by the physician/ advanced practice Clinician.

## 2015-07-02 NOTE — H&P (Signed)
[facility]  History and Physical  NAME:  [name] [name], [name]  SEX:   F  ADMIT: 07/02/2015  DOB:[birth year]  MR#    [id number]  ROOM:  CD12  ACCT#  [id number]    I hereby certify this patient for admission based upon medical necessity as   noted below:    <    CHIEF COMPLAINT:  The is a 65 year old female who is coming to the hospital having vomiting and   diarrhea for the past 2 to 3 days.  She has not had much intake since that   time, went to the primary care physician a few days ago, and then for the same   wanted to go back to the PCP, called the office, who asked her to come to the   emergency room as she has not gotten any better.  She had been having   abdominal pain which is diffuse.  She also says that she is not able to keep   anything down, feels extremely dehydrated.  She has had a recent hip surgery   in the last 6 months in [place] on the right side.  Has not been on any   antibiotics recently.  She says the pain in the abdomen is 8 out of 10, left   lower quadrant, but goes over the right side also.  No chest pain, no   palpitations, no loss of weight.    REVIEW OF SYSTEMS:  CONSTITUTIONAL:  Positive for generalized weakness, fatigue.  No fever or   chills.  HEENT:  Negative for congestion, upper respiratory symptoms.  Eyes negative   for blurred vision.  RESPIRATORY:  Negative for cough, shortness of breath.  CARDIOVASCULAR:  Negative for chest pain, palpitations.  GASTROINTESTINAL:  Positive abdominal pain, nausea, vomiting.  Negative for   heartburn.  GENITOURINARY:  Negative for dysuria, frequency or urgency.  MUSCULOSKELETAL:  Negative for back pain, neck pain.  SKIN:  Negative for itching and rash.  NEUROLOGICAL:  Negative for dizziness, weakness.   ENDOCRINOLOGY/HEMATOLOGY: Does not bruise or bleed easily.  PSYCHIATRIC:  Negative for depression.      PAST MEDICAL HISTORY:    Arthritis, chronic pain, GERD, hypertension, hepatitis C, post-traumatic   stress disorder.    SURGICAL HISTORY:   Hysterectomy, endoscopy, right hip replacement.    SOCIAL HISTORY:  Does not smoke, does not abuse alcohol.  Does not work.    FAMILY HISTORY:  Hypertension in mother, cancer in maternal aunt, alcohol abuse in maternal   grandfather.    HOME MEDICATIONS:  Seroquel 150 nightly, Tylenol 650 q.4 h. as needed, albuterol 90 two puffs   every 6 hours as needed, amlodipine 10 mg daily, chlorthalidone 25 mg daily,   clonidine 0.3 twice a day, diclofenac 75 twice a day, gabapentin 300,   lisinopril 5 mg daily, metoprolol 25 twice a day, oxycodone 5/325 one tablet   q.12 h. as needed.    LABORATORY DATA:  White count 20.3, H&H 15.6 and 44.4, platelets 263.  Urine:  500 glucose, 15   ketone, small blood, trace leukocyte esterase.  Chemistry:  Glucose 499,   potassium 2.8, creatinine 1.4, protein 8.6.  ABG:  pH 7.47, pCO2 of 50.2.  CT   abdomen and pelvis showed left-sided colitis, sigmoid colon diverticulosis,   mass in the left lower lobe measuring 1.9 cm not present on previous study,   bullous disease in the lungs, bilateral nonobstructing kidney stones.    PHYSICAL EXAMINATION:  VITAL SIGNS:  Blood pressure 130/97, pulse 120, respirations 18, temperature   98.3.  GENERAL:  Awake, alert.  HEENT:  Head normocephalic, atraumatic.  Eyes:  Conjunctivae normal.  NECK:  Normal.  Trachea midline, thyroid not enlarged.  CHEST:  S1 and S2 heard, tachycardia.  RESPIRATORY:  Effort normal, breath sounds normal, no wheezes.  ABDOMEN:  Tenderness in the lower quadrant on the left side.  No signs of   peritonitis.  MUSCULOSKELETAL:  Pain in movement of the right hip.  Normal dorsalis pedis   and posterior tibial pulses.  NEUROLOGIC:  Alert, oriented.  SKIN:  Warm and dry.  PSYCHIATRIC:  Very anxious.    ASSESSMENT AND PLAN:  1.  New onset type 2 diabetes.  2.  Acute left-sided colitis and sigmoid diverticulosis.  3.  Hypokalemia.  4.  Left lower lobe mass.  5.  Bilateral nonobstructing kidney stone.  6.  Acute kidney injury.   7.  Severe dehydration.    PLAN:  Admit the patient to the hospital, keep her on IV fluids and insulin drip.    Will recheck BMP q.12h.  GI consultation has been requested considering the   amount of pain and new colitis that she has.  Keep her on Cipro and Flagyl.    Check stool studies.  Replace potassium.    Plan discussed with patient and family at the bedside.     ADVANCE CARE PLANNING:  Plan discussed with the patient.  The patient has not had any history of   diabetes in the past.  [name] [name] [name], phone number is [phone].    The patient is FULL CODE for all practical purposes, okay for chest   compressions and intubation if needed.  Answered all questions.  Time spent on   advance care planning: 16 minutes.    Total time spent, more than 50% physical examination and discussion in detail   with the patient today was 45 minutes.        ___________________  Roderic Scarce MD  Dictated By: .   NS  D:07/02/2015 15:16:05  T: 07/02/2015 84:16:60  [id number]

## 2015-07-02 NOTE — Other (Signed)
Dr. Corinda Gubler paged regarding insulin drip DKA protocol, patient BS upon admission to unit is 138, Bicarb 377, and Anion Gap 11. Insulin drip held for now, STAT BMP ordered, awaiting orders for subq insulin. Will continue to monitor.

## 2015-07-02 NOTE — Progress Notes (Signed)
Problem: Pain  Goal: *Control of Pain  Outcome: Progressing Towards Goal  Patient has had moderate pain control during this shift, Patient has complained of pain 8/10 in right hip of a scale of 0 to 10, relieved by Morphine  IV, repositioning, and dimming the lights. Will continue to monitor.

## 2015-07-02 NOTE — Telephone Encounter (Signed)
Patient called stating she has been throwing up since Sunday night. Yesterday she called with c/o diarrhea. She says she is still throwing up and would like a shot of phenergan. After speaking to [facility], patient was advised to go to ER due to concerns of dehydration. Patient agreed.

## 2015-07-02 NOTE — ED Notes (Signed)
Pt out for test via stretcher and states she is feeling much better after receiving morphine.

## 2015-07-02 NOTE — Other (Signed)
----------  DocumentID:   [id number]              [facility]                       Patient Education Report         Name: [name] [name], [name] [name]                  Date: 07/02/2015    MRN: [id number]                    Time: 4:57:24 PM         Patient ordered video: Patient Safety: Stay Safe While you are in the   Hospital    from [id number] via phone number: [phone] at 4:57:24 PM

## 2015-07-02 NOTE — Other (Signed)
TRANSFER - OUT REPORT:    Verbal report given to Giselle, RN (name) on ELIAH OZAWA  being transferred to CDU (unit) for routine progression of care       Report consisted of patient???s Situation, Background, Assessment and   Recommendations(SBAR).     Information from the following report(s) SBAR was reviewed with the receiving nurse.    Lines:   Peripheral IV 07/02/15 Left;Lower Cephalic (Active)        Opportunity for questions and clarification was provided.      Patient transported with:   Registered Nurse

## 2015-07-02 NOTE — ED Triage Notes (Signed)
Pt started feeling nauseous and vomitting and diarrhea for the past 2-3 days. Went to the PCP and told to come here. PN/KH

## 2015-07-02 NOTE — Consults (Addendum)
Gastroenterology Consult    Patient: [name] [name] MRN: [id number]  SSN: [id number]    Date of Birth: [birth year]  Age: 65 y.o.  Sex: female      Assessment:   1) N/V, Abdominal Pain and Diarrhea progressively worsened over the last 2days. Suspect Gastroenteritis. Other Ddx: Ischemic vs Infectious vs Inflammatory source.     -CT notes left-sided colitis along with sigmoid colon diverticulosis.   -Prior CT noted colonic diverticulosis with apparent ingested animal bone or other ingested material in the mid sigmoid in transverse orientation without frank evidence of perforation. Pt reports prior EGD/Colonoscopy 11/2013 in [place] as normal. We do not have the reports available for review at this time  2) Hx of foreign body in the sigmoid colon noted on prior CT scan. S/p sigmoidoscopy 08/16/14 which noted a large bone that was removed. There was no evidence of perforation.  3) Hx of HCV, genotype 1-Pt was undetectable post treatment on HCV Viral load. She has an SVR. No further treatment or evaluation needed.   4) GERD maintained on Prilosec   5) Chronic Pain Syndrome-followed by pain management in [place]   Plan:   1) Monitor CBC, BMP in am   2) Will order Stool for C. Difficile, and additional stool studies   3) Continue IVF for hydration and correction of electrolytes per IM   4) Agree with Cipro/Flagyl for now  5) Clear liquid diet as tolerated   6) More recommendations pending above results and pts clinical condition     [facility] Staff note:  Patient seen and examined.  Patient with nausea, vomiting and diarrhea for the past 3 days.   She has diffuse cramping but no specific pain.  No hematemesis.  No blood with stool.  No recent antibiotics or travel.  No sick contacts.  Elevated wbc at 20.  CT noted left sided colitis and diverticulosis, also noted lung mass.  Most likely infectious gastroenteritiis/colitis.  I agree with Ciprofloxacin and Flagyl.  Continue Bowel rest.  Will order stool studies.  Will continue to  follow clinically. --  Olen Pel, MD        Subjective:      [name] [name] is a 65 y.o. female who is being seen for nausea, vomiting, abdominal pain and diarrhea x 2 days. The patient is followed in our office by Dr. Lendell Caprice for HCV and we have now been consulted to evaluate the patient further. She reports her last episode of vomiting early this morning noted as clear liquid. She denies hematemesis. She denies dysphagia dyspepsia odynophagia. She denies shortness of breath, chest pain, fever or chills. She reports a decrease in appetite but denies weight loss. Her abdominal pain is described as a diffuse ache breathing currently 1/10. However patient states he just received pain medicine. She denies radiation. Her normal bowel pattern consist of daily soft brown color stools but states that she has had 2 loose stools daily for the last 2 days. Bowel movement earlier this morning also loose small in amount however patient reports stools mixed with traces of bright red blood. She has not had any additional BMs this early this am. The patient was recently started on Ambien to aid in sleep but denies starting any other new medications over-the-counter herbal supplements or antibiotics. She denies recent travel or sick contacts. She denies any other complaints at this time.     Hospital Problems  Date Reviewed: 07/21/15          Codes  Class Noted POA    Acute colitis ICD-10-CM: K52.9  ICD-9-CM: 558.9  07/02/2015 Unknown        New onset type 1 diabetes mellitus, uncontrolled (HCC) ICD-10-CM: E10.65  ICD-9-CM: 250.03  07/02/2015 Unknown        Acute hyperglycemia ICD-10-CM: R73.9  ICD-9-CM: 790.29  07/02/2015 Unknown            Past Medical History   Diagnosis Date   ??? Arthritis    ??? Chronic pain    ??? GERD (gastroesophageal reflux disease)    ??? Hypertension    ??? Liver disease      HEP C   ??? PTSD (post-traumatic stress disorder)      lived through the [place]     Past Surgical History    Procedure Laterality Date   ??? Hx gyn  2010     hysterectomy   ??? Hx endoscopy     ??? 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 Hx hip replacement        Family History   Problem Relation Age of Onset   ??? Hypertension Mother    ??? Cancer Maternal Aunt    ??? Alcohol abuse Maternal Grandfather      Social History   Substance Use Topics   ??? Smoking status: Never Smoker   ??? Smokeless tobacco: Never Used   ??? Alcohol use No      Current Facility-Administered Medications   Medication Dose Route Frequency Provider Last Rate Last Dose   ??? sodium chloride 0.9 % bolus infusion 1,000 mL  1,000 mL IntraVENous NOW Olin Pia, NP       ??? insulin regular (NOVOLIN R, HUMULIN R) injection 8 Units  0.1 Units/kg IntraVENous ONCE Posey Pronto, MD       ??? insulin regular (NOVOLIN R, HUMULIN R) 100 Units in 0.9% sodium chloride 100 mL infusion  0.001-0.5 Units/kg/hr IntraVENous TITRATE Posey Pronto, MD       ??? potassium chloride 10 mEq in 100 ml IVPB  10 mEq IntraVENous ONCE Posey Pronto, MD       ??? ciprofloxacin (CIPRO) 400 mg IVPB (premix)  400 mg IntraVENous NOW Posey Pronto, MD       ??? metroNIDAZOLE (FLAGYL) IVPB premix 500 mg  500 mg IntraVENous NOW Posey Pronto, MD         Current Outpatient Prescriptions   Medication Sig Dispense Refill   ??? ondansetron (ZOFRAN ODT) 8 mg disintegrating tablet Take 1 Tab by mouth every eight (8) hours as needed for Nausea. 30 Tab 0   ??? lisinopril (PRINIVIL, ZESTRIL) 5 mg tablet TAKE ONE TABLET BY MOUTH ONCE DAILY 30 Tab 2   ??? zolpidem (AMBIEN) 5 mg tablet Take 1 Tab by mouth nightly as needed for Sleep. Max Daily Amount: 5 mg. 15 Tab 0   ??? guaiFENesin-codeine (ROBITUSSIN AC) 100-10 mg/5 mL solution Take 5 mL by mouth three (3) times daily as needed for Cough. Max Daily Amount: 15 mL. Indications: COUGH 118 mL 0   ??? albuterol (PROVENTIL HFA, VENTOLIN HFA, PROAIR HFA) 90 mcg/actuation inhaler Take 2 Puffs by inhalation every six (6) hours as needed for  Wheezing or Shortness of Breath. 1 Inhaler 0   ??? oxyCODONE-acetaminophen (PERCOCET) 5-325 mg per tablet Take 1 Tab by mouth every twelve (12) hours as needed for Pain. Max Daily Amount: 2 Tabs. 40 Tab 0   ??? gabapentin (NEURONTIN) 300 mg  capsule      ??? SEROQUEL XR 150 mg sr tablet TAKE 1 TABLET BY MOUTH NIGHTLY 30 Tab 2   ??? diclofenac EC (VOLTAREN) 75 mg EC tablet TAKE 1 TABLET BY MOUTH TWICE A DAY TAKE WITH MEALS 60 Tab 0   ??? amLODIPine (NORVASC) 10 mg tablet Take 1 Tab by mouth daily. 30 Tab 1   ??? chlorthalidone (HYGROTEN) 25 mg tablet Take 1 Tab by mouth daily. 30 Tab 1   ??? cloNIDine HCl (CATAPRES) 0.3 mg tablet Take 1 Tab by mouth two (2) times a day. 60 Tab 1   ??? metoprolol tartrate (LOPRESSOR) 25 mg tablet Take 1 Tab by mouth two (2) times a day. 30 Tab 1   ??? acetaminophen (TYLENOL) 325 mg tablet Take 650 mg by mouth every four (4) hours as needed for Pain.          Allergies   Allergen Reactions   ??? Chocolate [Cocoa] Sneezing       Review of Systems:  A 12 point review of systems were obtained with pertinent positives noted in above HPI, otherwise negative     Objective:     Patient Vitals for the past 24 hrs:   Temp Pulse Resp BP SpO2   07/02/15 1202 - (!) 111 16 (!) 156/105 96 %   07/02/15 0948 98.3 ??F (36.8 ??C) (!) 125 18 (!) 130/97 -     No intake or output data in the 24 hours ending 07/02/15 1355    Physical Exam:  GENERAL: alert, pleasant, cooperative, no distress, appears stated age  LUNG: symmetric chest expansion, no wheeze, rhonchi or rales   HEART: tachycardic   ABDOMEN: soft, diffuse tenderness, bowel sounds normal. No palpable masses, hernias or organomegaly appreciated   EXTREMITIES: no cyanosis or edema, Pulses 2+     Diagnostic Imaging:  Ct Abd Pelv W Cont    Result Date: 07/02/2015  CT abdomen and pelvis with intravenous contrast. Comparison 11/18/2014. HISTORY: Vomiting and diarrhea. TECHNIQUE: Exam with nonionic intravenous  contrast. FINDINGS: Benign-appearing calcification within the left breast. Bullous changes are seen in the upper lobes. And left lower lobe. There is a well-circumscribed mass identified in the left lower lobe image 3 series 4 measuring 1.9 cm. Not present on the previous examination. Liver, gallbladder and spleen normal. Stomach unremarkable. Normal pancreas. No adrenal masses. Small cyst within the right kidney. Tiny nonobstructing stones in the right kidney. Tiny nonobstructing stones in the left kidney. Small left renal cysts. Abdominal aorta normal in size. Normal-appearing appendix. Bowel gas pattern shows thickening of the colon from the splenic flexure down to the sigmoid colon. Most likely compatible with colitis. There are scattered diverticula identified within the sigmoid colon. Favor colitis over diverticulitis. Right hip prosthesis. Bladder outline normal. Lower facet arthropathy.     IMPRESSION: 1. Left-sided colitis. 2. Sigmoid colon diverticulosis. 3. There is a mass identified within the left lower lobe measuring 1.9 cm not present on previous study. Dedicated chest CT recommended for further evaluation. 4. Bullous disease within the lungs. 5. Bilateral nonobstructing renal stones. 6. Benign-appearing calcification within the left breast. 7. Right hip prosthesis       Laboratory Results:    Labs: Results:   Chemistry Recent Labs      07/02/15   1203  07/02/15   1101   GLU   --   499*   NA   --   131*   K   --   2.8*  CL   --   89*   CO2  39*  31   BUN   --   22   CREA   --   1.4*   CA   --   9.7   AP   --   85   TP   --   8.6*   ALB   --   3.6    Estimated Creatinine Clearance: 45.2 mL/min (based on Cr of 1.4).   CBC w/Diff Recent Labs      07/02/15   1101   WBC  20.3*   RBC  5.48*   HGB  15.6   HCT  44.4   PLT  263   GRANS  73.6*   LYMPH  19.2*   EOS  0.1      Cardiac Enzymes No results for input(s): CPK, CKND1, MYO in the last 72 hours.    No lab exists for component: CKRMB, TROIP    Coagulation No results for input(s): PTP, INR, APTT in the last 72 hours.    No lab exists for component: INREXT    Hepatitis Panel No results found for: HAMAT, HAAB, HABT, HAAT, XHBAGT, HBSAG, HBAGT, HBSB, HBSAT, HBABN, HBCM, HBCAB, HBCAT, XBCABS, HBEAB, HBEAG, HCAB, XHEPCS, [id number], HBEGLT, HBCMLT, HBCLT, HBEBLT, [id number], [id number], HAVMLT, [id number], HBCMLT, [id number], HCGAT   Amylase Lipase    Liver Enzymes Recent Labs      07/02/15   1101   TP  8.6*   ALB  3.6   AP  85   SGOT  7*   ALT  15      Thyroid Studies No results for input(s): T4, T3U, TSH, TSHEXT in the last 72 hours.    No lab exists for component: T3RU     Pathology pathology     Signed By: Myrla Halsted MS, APRN, FNP-C    July 02, 2015     Office: [phone]

## 2015-07-03 ENCOUNTER — Inpatient Hospital Stay: Admit: 2015-07-03 | Payer: MEDICARE | Primary: Physician Assistant

## 2015-07-03 ENCOUNTER — Inpatient Hospital Stay: Payer: MEDICARE | Primary: Physician Assistant

## 2015-07-03 LAB — CBC WITH AUTOMATED DIFF
BASOPHILS: 0.3 % (ref 0–3)
EOSINOPHILS: 1.4 % (ref 0–5)
HCT: 37.9 % (ref 37.0–50.0)
HGB: 13 gm/dl (ref 13.0–17.2)
IMMATURE GRANULOCYTES: 0.1 % (ref 0.0–3.0)
LYMPHOCYTES: 40.3 % (ref 28–48)
MCH: 29.3 pg (ref 25.4–34.6)
MCHC: 34.3 gm/dl (ref 30.0–36.0)
MCV: 85.6 fL (ref 80.0–98.0)
MONOCYTES: 6.8 % (ref 1–13)
MPV: 12.2 fL — ABNORMAL HIGH (ref 6.0–10.0)
NEUTROPHILS: 51.1 % (ref 34–64)
NRBC: 0 (ref 0–0)
PLATELET: 172 10*3/uL (ref 140–450)
RBC: 4.43 M/uL (ref 3.60–5.20)
RDW-SD: 42.1 (ref 36.4–46.3)
WBC: 10.3 10*3/uL (ref 4.0–11.0)

## 2015-07-03 LAB — METABOLIC PANEL, BASIC
BUN: 13 mg/dl (ref 7–25)
BUN: 16 mg/dl (ref 7–25)
CO2: 29 mEq/L (ref 21–32)
CO2: 32 mEq/L (ref 21–32)
Calcium: 8.3 mg/dl — ABNORMAL LOW (ref 8.5–10.1)
Calcium: 8.3 mg/dl — ABNORMAL LOW (ref 8.5–10.1)
Chloride: 102 mEq/L (ref 98–107)
Chloride: 96 mEq/L — ABNORMAL LOW (ref 98–107)
Creatinine: 0.8 mg/dl (ref 0.6–1.3)
Creatinine: 0.9 mg/dl (ref 0.6–1.3)
GFR est AA: >60
GFR est AA: >60
GFR est non-AA: >60
GFR est non-AA: >60
Glucose: 231 mg/dl — ABNORMAL HIGH (ref 74–106)
Glucose: 263 mg/dl — ABNORMAL HIGH (ref 74–106)
Potassium: 3 mEq/L — ABNORMAL LOW (ref 3.5–5.1)
Potassium: 3.3 mEq/L — ABNORMAL LOW (ref 3.5–5.1)
Sodium: 134 mEq/L — ABNORMAL LOW (ref 136–145)
Sodium: 141 mEq/L (ref 136–145)

## 2015-07-03 LAB — GLUCOSE, POC
Glucose (POC): 351 mg/dL — ABNORMAL HIGH (ref 65–105)
Glucose (POC): 296 mg/dL — ABNORMAL HIGH (ref 65–105)
Glucose (POC): 195 mg/dL — ABNORMAL HIGH (ref 65–105)
Glucose (POC): 325 mg/dL — ABNORMAL HIGH (ref 65–105)
Glucose (POC): 290 mg/dL — ABNORMAL HIGH (ref 65–105)
Glucose (POC): 122 mg/dL — ABNORMAL HIGH (ref 65–105)

## 2015-07-03 MED ORDER — INSULIN LISPRO 100 UNIT/ML INJECTION
100 unit/mL | Freq: Three times a day (TID) | SUBCUTANEOUS | Status: DC
Start: 2015-07-03 — End: 2015-07-05
  Administered 2015-07-03 – 2015-07-05 (×6): via SUBCUTANEOUS

## 2015-07-03 MED ORDER — INSULIN LISPRO 100 UNIT/ML INJECTION
100 unit/mL | Freq: Four times a day (QID) | SUBCUTANEOUS | Status: DC
Start: 2015-07-03 — End: 2015-07-07
  Administered 2015-07-03 – 2015-07-07 (×12): via SUBCUTANEOUS

## 2015-07-03 MED ORDER — INSULIN GLARGINE 100 UNIT/ML INJECTION
100 unit/mL | Freq: Every day | SUBCUTANEOUS | Status: DC
Start: 2015-07-03 — End: 2015-07-04
  Administered 2015-07-03 – 2015-07-04 (×2): via SUBCUTANEOUS

## 2015-07-03 MED ORDER — POTASSIUM CHLORIDE SR 20 MEQ TAB, PARTICLES/CRYSTALS
20 mEq | ORAL | Status: AC
Start: 2015-07-03 — End: 2015-07-03
  Administered 2015-07-03: 18:00:00 via ORAL

## 2015-07-03 MED ORDER — BUDESONIDE-FORMOTEROL HFA 160 MCG-4.5 MCG/ACTUATION AEROSOL INHALER
Freq: Two times a day (BID) | RESPIRATORY_TRACT | Status: DC
Start: 2015-07-03 — End: 2015-07-07
  Administered 2015-07-05 – 2015-07-06 (×2): via RESPIRATORY_TRACT

## 2015-07-03 MED ORDER — INSULIN LISPRO 100 UNIT/ML INJECTION
100 unit/mL | Freq: Three times a day (TID) | SUBCUTANEOUS | Status: DC
Start: 2015-07-03 — End: 2015-07-03

## 2015-07-03 MED ORDER — IOPAMIDOL 76 % IV SOLN
370 mg iodine /mL (76 %) | Freq: Once | INTRAVENOUS | Status: AC
Start: 2015-07-03 — End: 2015-07-04

## 2015-07-03 MED ORDER — GLUCAGON 1 MG INJECTION
1 mg | INTRAMUSCULAR | Status: DC | PRN
Start: 2015-07-03 — End: 2015-07-03

## 2015-07-03 MED ORDER — INSULIN GLARGINE 100 UNIT/ML INJECTION
100 unit/mL | SUBCUTANEOUS | Status: DC
Start: 2015-07-03 — End: 2015-07-03

## 2015-07-03 MED ORDER — IPRATROPIUM-ALBUTEROL 2.5 MG-0.5 MG/3 ML NEB SOLUTION
2.5 mg-0.5 mg/3 ml | Freq: Four times a day (QID) | RESPIRATORY_TRACT | Status: DC
Start: 2015-07-03 — End: 2015-07-04
  Administered 2015-07-03 – 2015-07-04 (×2): via RESPIRATORY_TRACT

## 2015-07-03 MED ORDER — POTASSIUM CHLORIDE 10 % ORAL LIQUID
20 mEq/15 mL | ORAL | Status: AC
Start: 2015-07-03 — End: 2015-07-02
  Administered 2015-07-03: 03:00:00 via ORAL

## 2015-07-03 MED ORDER — SODIUM CHLORIDE 0.9 % IJ SYRG
INTRAMUSCULAR | Status: AC
Start: 2015-07-03 — End: 2015-07-03
  Administered 2015-07-03: 17:00:00 via INTRAVENOUS

## 2015-07-03 MED ORDER — DEXTROSE 50% IN WATER (D50W) IV SYRG
INTRAVENOUS | Status: DC | PRN
Start: 2015-07-03 — End: 2015-07-03

## 2015-07-03 MED ORDER — LORAZEPAM 2 MG/ML IJ SOLN
2 mg/mL | INTRAMUSCULAR | Status: DC | PRN
Start: 2015-07-03 — End: 2015-07-07

## 2015-07-03 MED ORDER — SODIUM CHLORIDE 0.9 % NEB SOLUTION
0.9 % | RESPIRATORY_TRACT | Status: AC
Start: 2015-07-03 — End: 2015-07-03
  Administered 2015-07-03: 13:00:00

## 2015-07-03 MED FILL — IPRATROPIUM-ALBUTEROL 2.5 MG-0.5 MG/3 ML NEB SOLUTION: 2.5 mg-0.5 mg/3 ml | RESPIRATORY_TRACT | Qty: 3

## 2015-07-03 MED FILL — SEROQUEL XR 50 MG TABLET,EXTENDED RELEASE: 50 mg | ORAL | Qty: 3

## 2015-07-03 MED FILL — KLOR-CON M20 MEQ TABLET,EXTENDED RELEASE: 20 mEq | ORAL | Qty: 2

## 2015-07-03 MED FILL — HEPARIN (PORCINE) 5,000 UNIT/ML IJ SOLN: 5000 unit/mL | INTRAMUSCULAR | Qty: 1

## 2015-07-03 MED FILL — METOPROLOL TARTRATE 25 MG TAB: 25 mg | ORAL | Qty: 1

## 2015-07-03 MED FILL — SODIUM CHLORIDE 0.9 % NEB SOLUTION: 0.9 % | RESPIRATORY_TRACT | Qty: 3

## 2015-07-03 MED FILL — ALBUTEROL SULFATE 2.5 MG/0.5 ML NEB SOLUTION: 2.5 mg/0.5 mL | RESPIRATORY_TRACT | Qty: 0.5

## 2015-07-03 MED FILL — AMLODIPINE 5 MG TAB: 5 mg | ORAL | Qty: 2

## 2015-07-03 MED FILL — ISOVUE-370  76 % INTRAVENOUS SOLUTION: 370 mg iodine /mL (76 %) | INTRAVENOUS | Qty: 80

## 2015-07-03 MED FILL — CIPROFLOXACIN IN D5W 400 MG/200 ML IV PIGGY BACK: 400 mg/200 mL | INTRAVENOUS | Qty: 200

## 2015-07-03 MED FILL — POTASSIUM CHLORIDE 10 % ORAL LIQUID: 20 mEq/15 mL | ORAL | Qty: 15

## 2015-07-03 MED FILL — METRONIDAZOLE IN SODIUM CHLORIDE (ISO-OSM) 500 MG/100 ML IV PIGGY BACK: 500 mg/100 mL | INTRAVENOUS | Qty: 100

## 2015-07-03 MED FILL — OXYCODONE-ACETAMINOPHEN 5 MG-325 MG TAB: 5-325 mg | ORAL | Qty: 1

## 2015-07-03 MED FILL — CLONIDINE 0.2 MG TAB: 0.2 mg | ORAL | Qty: 1

## 2015-07-03 MED FILL — LISINOPRIL 5 MG TAB: 5 mg | ORAL | Qty: 1

## 2015-07-03 MED FILL — CLONIDINE 0.2 MG TAB: 0.2 mg | ORAL | Qty: 2

## 2015-07-03 MED FILL — BD POSIFLUSH NORMAL SALINE 0.9 % INJECTION SYRINGE: INTRAMUSCULAR | Qty: 10

## 2015-07-03 MED FILL — SYMBICORT 160 MCG-4.5 MCG/ACTUATION HFA AEROSOL INHALER: RESPIRATORY_TRACT | Qty: 6

## 2015-07-03 NOTE — Progress Notes (Signed)
Problem: Self Care Deficits Care Plan (Adult)  Goal: Interventions  OT goals initiated 07/03/2015 and will be met by patient within 10 sessions.    - Patient will be Independent with basic activities of daily living and am care set-up using adaptive equipment and/or DME as needed.  - Patient will perform toileting tasks with Independent with Good safety to reduce falls risk.  - Patient and/or cargegiver will be instructed in bathroom and home safety, and AE and/or DME options for ADLs, as needed. Patient and/or caregiver will verbalize understanding.    - Patient will be Independent with functional transfers with Good safety to reduce falls risk.    - Patient will perform dynamic standing balance activities for improved ADL/IADL function with Independent and good balance.  - Patient will tolerate standing for 20 minutes to promote maximal independence in ADLs.    - Patient will perform 10 repetitions of active strengthening exercises for bilateral upper extremity(s) with Independent.  - Patient will be Independent with strengthening upper extremity home exercise program.    - Patient and/or cargegiver will be instructed in home program for balance, flexibility and strength training, as needed. Patient and/or caregiver will verbalize understanding.    - Patient and/or caregiver will be instructed in energy conservation techniques, as needed. Patient will problem solve use of these techniques during functional activities. Patient and/or caregiver will verbalize understanding.      OCCUPATIONAL THERAPY EVALUATION      Patient: [name] [name] (65 y.o. female)  Room: CD12/CD12     Date: 07/03/2015  Start Time:  905        End Time:  925     Primary Diagnosis: Acute hyperglycemia  New onset type 1 diabetes mellitus, uncontrolled ([place])  Acute colitis           Precautions: Falls.  Ordered Weight Bearing Status: None      OT orders reviewed, chart reviewed on [name] [name]. Discussed with patient's RN.       ASSESSMENT :   Based on the objective data described below, the patient presents with   Decreased Strength  Decreased ADL/Functional Activities  Decreased Transfer Abilities  Decreased Ambulation Ability/Technique  Decreased Balance  Decreased Activity Tolerance     Patient will benefit from skilled occupational therapy intervention to address the above impairments.      Patient???s rehabilitation potential is considered to be Good for above stated goals.        PLAN :  Planned Interventions: ADI training, activity tolerance, functional balance training, functional mobility training, therapeutic exercise, therapeutic activity, patient/caregiver education and training, home exercise program and neuromuscular re-education.  Frequency/Duration: Patient will be followed by occupational  therapy 1-5x/week to address goals.  Discharge Recommendations: Home health OT.  Further Equipment Recommendations for Discharge: To be determined.      Please refer to Patient Education and Care Plan sections of chart for additional details.      EDUCATION:      Barriers to Learning/Limitations:  None  Education provided patient on  Role of OT, Benefit of activity while hospitalized, Call for assistance, Out of bed 2-3 times/day, Staff assistance with mobility, Changes positions frequently and Sit out of bed for 45-60 minutes or as tolerated      SUBJECTIVE:      Patient reports when she is feeling bad at home her spouse helps her.       OBJECTIVE DATA SUMMARY:      Patient was admitted to the  hospital on 07/02/2015 with   Chief Complaint   Patient presents with   ??? Nausea   ??? Vomiting   ??? Diarrhea   ??? Abdominal Pain     Present illness history:   Patient Active Problem List     Diagnosis Date Noted   ??? Acute colitis 07/02/2015   ??? New onset type 1 diabetes mellitus, uncontrolled ([place]) 07/02/2015   ??? Acute hyperglycemia 07/02/2015   ??? Accelerated hypertension 04/08/2015   ??? Severe headache 04/07/2015   ??? Osteoarthritis of hips, 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    ??? PTSD (post-traumatic stress disorder) 01/29/2015   ??? Severe hypertension 07/21/2014      Past Medical history:   Past Medical History   Diagnosis Date   ??? Arthritis     ??? Chronic pain     ??? GERD (gastroesophageal reflux disease)     ??? Hypertension     ??? Liver disease         HEP C   ??? PTSD (post-traumatic stress disorder)         lived through the [place] in 1993        Patient found: Bed and ICU/stepdown equip.     Pain Assessment: Not rated  Pain Location:  Right IV site,  [facility] nurse called - removed IV.  Therapist gave patient a warm compress.       PRIOR LEVEL OF FUNCTION / LIVING SITUATION:      Information was obtained by:   patient  Home environment:   Lives with Spouse, House  Prior level of function:   drove, independent with ADLs and Independent with ambulation      COGNITIVE STATUS:      Mental Status:   Oriented to, person, place, month, year, situation, alert and awake  Communication:   grossly intact  Attention Span:   good(>27mn)  Follows Commands:   intact and 2 step      ACTIVITIES OF DAILY LIVING STATUS:      Based on direct observation and clinical assessment.     Eating -  supervision and set-up  Grooming -  supervision and set-up  UB Bathing -  supervision and set-up  LB Bathing -  standby assist and set-up  UB Dressing -  supervision and set-up  LB Dressing -  standby assist and set-up  Toileting -  standby assist and set-up      FUNCTIONAL MOBILITY and BALANCE STATUS:      Mobility:  Supine to sit -  supervision and set-up  Sit to Stand -  standby assist and set-up  Stand to Sit -  standby assist and set-up     Mobility treatment: Instructed patient on proper hand placement technique during sit <->stand, positioning and safety during bed mobility and transfers.      Transfers:  side stepped to head of bed -  supervision     Balance:  Static Sitting Balance -  normal  Dynamic Sitting Balance -  good +  Static Standing Balance -  good  Dynamic Standing Balance -  good-      Activity Tolerance: fair+.     Activity tolerance treatment: Instructed patient on the importance of activity while hospitalized to prevent a decline in function., Encouraged patient to sit up in chair for 45 (+) minutes or as tolerated 2-3 times a day, with assistance as needed and The importance of maintaining UE muscle strength and activity tolerance while hospitalized to promote increased safety  and independence in ADLs for safe return home   UPPER EXTREMITY STATUS:      Dominance:right  RIGHT: active range of motion is [facility].  Strength is grossly graded as 3+/5.  Comment: no c/o paresthesia and intact coordination  LEFT:   active range of motion is [facility].  Strength is grossly graded as 3+/5.  Comment: no c/o paresthesia and intact coordination     Final position: bed, all needs close and agrees to call for assistance.     Thank you for this referral.  Leighton Ruff, OTR/L  [phone]      TREATMENT      Patient received / participated in 8 minutes of treatment immediately following evaluation and/or educational instruction during evaluation.     OBJECTIVE: Patient tolerated sitting EOB for 6 minutes with Modified independent to increase sitting tolerance for self care skills. Also, to allow light headiness/dizziness subside as needed.   Patient demonstrating good + dynamic standing balance.      Patient completed the following; tap 3 pieces of rolled up paper towels placed in front of patient to left, middle, right with each leg x 2 reps and picking up 3 paper towels with bilateral, lower extremity(s) with no loss of balance when crossing midline.  Required Supervision.  Patient required support of 1 upper extremity to safely complete activities.          ASSESSMENT:  Progression toward goals:  [X]           Improving appropriately and progressing toward goals  [ ]           Improving slowly and progressing toward goals  [ ]           Not making progress toward goals and plan of care will be adjusted   [ ]           Patient has met goals.     Patient demonstrated the following during session: good effort and good motivation      Patient would benefit from continued skilled OT while hospitalized to address prior mentioned deficits; promote maximal independence in ADLs; safe return to PLOF and/or for appropriate disposition recommendations.     PLAN: Continue OT POC      Leighton Ruff, OTR/L  Pager: [phone]

## 2015-07-03 NOTE — Progress Notes (Addendum)
PROGRESS NOTE   PATIENT:  [name] [name]           MRN: [id number]           [facility], CD12/CD12           07/03/2015, 9:56 AM    Assessment:   1) N/V, Abdominal Pain and Diarrhea-Resolved. Suspect secondary to Infectious source (PNA, UTI). Other Ddx: Ischemic vs Inflammatory source although less likely.    -CT notes left-sided colitis along with sigmoid colon diverticulosis.  2) Hx of foreign body in the sigmoid colon noted on prior CT scan. S/p sigmoidoscopy 08/16/14 which noted a large bone that was removed. There was no evidence of perforation.  3) Hx of HCV, genotype 1-Pt was undetectable post treatment on HCV Viral load. She has an SVR. No further treatment or evaluation needed.   4) GERD maintained on Prilosec   5) Chronic Pain Syndrome-followed by pain management in [place]   6) LLL Lung mass-w/u pending per IM/Pulmonary   7) Right basilar Pneumonia per IM   Plan:   1) Monitor CBC, BMP in am   2) Stool for C. Difficile, and additional stool studies pending   3) Continue IVF for hydration and correction of electrolytes per IM   4) Continue/complete Cipro/Flagyl course  5) Advance to low residue diet as tolerated   6) Anticipate discharge within the next 24hours or so as long as the pt remains stable and continues to improve. She will need to f/u as an outpt with Dr. Lendell Caprice in 4 weeks   ??  Will sign off for now and be available as needed     [facility] Staff note:  Patient with probable infectious gastroenteritis. She had CT with with possible changes of colitis. She seems to have had resolution of symptoms over the past 24 hours with no further vomiting or diarrhea. Normalization of WBC.  No further recommendations at this time.  Will have patient follow up in 4 weeks and consider colonoscopy at that time.  -- Olen Pel, MD    SUBJECTIVE:  Pt reports significant improvement in her abdominal pain. She denies SOB, chest pain, nausea, vomiting. Tolerating diet. No BM so far today.      OBJECTIVE:  Patient Vitals for the past 24 hrs:   Temp Pulse Resp BP SpO2   07/03/15 0614 98.8 ??F (37.1 ??C) - - - -   07/03/15 0457 - 96 19 109/76 99 %   07/03/15 0258 - 80 17 96/72 95 %   07/03/15 0000 98.8 ??F (37.1 ??C) 90 16 118/79 98 %   07/02/15 2058 - (!) 111 21 (!) 120/95 95 %   07/02/15 1958 98.7 ??F (37.1 ??C) (!) 108 17 (!) 130/93 96 %   07/02/15 1841 - (!) 111 17 (!) 138/105 93 %   07/02/15 1713 - (!) 113 13 (!) 149/109 95 %   07/02/15 1536 - (!) 114 20 (!) 131/102 100 %   07/02/15 1506 - (!) 113 12 (!) 153/115 100 %   07/02/15 1436 - (!) 114 18 (!) 136/113 92 %   07/02/15 1202 - (!) 111 16 (!) 156/105 96 %       Intake/Output Summary (Last 24 hours) at 07/03/15 0956  Last data filed at 07/03/15 0400   Gross per 24 hour   Intake           693.28 ml   Output  650 ml   Net            43.28 ml     Physical Exam:  GENERAL: alert, pleasant, cooperative, no distress, appears stated age  LUNG: clear to auscultation bilaterally  HEART: regular rate and rhythm, S1, S2   ABDOMEN: soft, mild diffuse tenderness appreciated on palpation, bowel sounds normal. No palpable masses, hernias or organomegaly appreciated     Labs: Results:   Chemistry Recent Labs      07/03/15   0530  07/02/15   1808  07/02/15   1203  07/02/15   1101   GLU  231*  263*   --   499*   NA  141  134*   --   131*   K  3.3*  3.0*   --   2.8*   CL  102  96*   --   89*   CO2  32  29  39*  31   BUN  13  16   --   22   CREA  0.8  0.9   --   1.4*   CA  8.3*  8.3*   --   9.7   AP   --    --    --   85   TP   --    --    --   8.6*   ALB   --    --    --   3.6    Estimated Creatinine Clearance: 79.1 mL/min (based on Cr of 0.8).   CBC w/Diff Recent Labs      07/03/15   0530  07/02/15   1101   WBC  10.3  20.3*   RBC  4.43  5.48*   HGB  13.0  15.6   HCT  37.9  44.4   PLT  172  263   GRANS  51.1  73.6*   LYMPH  40.3  19.2*   EOS  1.4  0.1      Cardiac Enzymes No results for input(s): CPK, CKND1, MYO in the last 72 hours.     No lab exists for component: CKRMB, TROIP   Coagulation No results for input(s): PTP, INR, APTT in the last 72 hours.    No lab exists for component: INREXT    Hepatitis Panel No results found for: HAMAT, HAAB, HABT, HAAT, XHBAGT, HBSAG, HBAGT, HBSB, HBSAT, HBABN, HBCM, HBCAB, HBCAT, XBCABS, HBEAB, HBEAG, HCAB, XHEPCS, [id number], HBEGLT, HBCMLT, HBCLT, HBEBLT, [id number], [id number], HAVMLT, [id number], HBCMLT, [id number], HCGAT   Amylase Lipase    Liver Enzymes Recent Labs      07/02/15   1101   TP  8.6*   ALB  3.6   AP  85   SGOT  7*   ALT  15      Thyroid Studies No results for input(s): T4, T3U, TSH, TSHEXT in the last 72 hours.    No lab exists for component: T3RU     Pathology pathology     Diagnostic Imaging:  Xr Chest Sngl V    Result Date: 07/03/2015  History: Acute hyperglycemia, short of breath AP chest: Compared to 07/02/2015 the heart is normal in size. Patchy airspace disease persists left mid to lower zone slightly increased with similar degree of inspiration. No effusion.     IMPRESSION: Right basilar pneumonia. PA lateral views chest recommended when the patient is able.     Xr Chest Sngl V  Result Date: 07/02/2015  AP chest. HISTORY: Shortness of breath. Mass identified in the left lung on recent abdomen CT scan. FINDINGS: There is a well-circumscribed nodule identified in the left lower lung field on CT scan. With associated minimal infiltrative change in left base. Remaining lungs are clear. Cardiac silhouette is normal.     IMPRESSION: Well-circumscribed nodule in the left lung base with associated minimal infiltrative change.     Ct Abd Pelv W Cont    Result Date: 07/02/2015  CT abdomen and pelvis with intravenous contrast. Comparison 11/18/2014. HISTORY: Vomiting and diarrhea. TECHNIQUE: Exam with nonionic intravenous contrast. FINDINGS: Benign-appearing calcification within the left breast. Bullous changes are seen in the upper lobes. And left lower lobe. There is  a well-circumscribed mass identified in the left lower lobe image 3 series 4 measuring 1.9 cm. Not present on the previous examination. Liver, gallbladder and spleen normal. Stomach unremarkable. Normal pancreas. No adrenal masses. Small cyst within the right kidney. Tiny nonobstructing stones in the right kidney. Tiny nonobstructing stones in the left kidney. Small left renal cysts. Abdominal aorta normal in size. Normal-appearing appendix. Bowel gas pattern shows thickening of the colon from the splenic flexure down to the sigmoid colon. Most likely compatible with colitis. There are scattered diverticula identified within the sigmoid colon. Favor colitis over diverticulitis. Right hip prosthesis. Bladder outline normal. Lower facet arthropathy.     IMPRESSION: 1. Left-sided colitis. 2. Sigmoid colon diverticulosis. 3. There is a mass identified within the left lower lobe measuring 1.9 cm not present on previous study. Dedicated chest CT recommended for further evaluation. 4. Bullous disease within the lungs. 5. Bilateral nonobstructing renal stones. 6. Benign-appearing calcification within the left breast. 7. Right hip prosthesis       Allergies   Allergen Reactions   ??? Chocolate [Cocoa] Sneezing     Current Facility-Administered Medications   Medication Dose Route Frequency   ??? albuterol-ipratropium (DUO-NEB) 2.5 MG-0.5 MG/3 ML  3 mL Nebulization Q6H RT   ??? LORazepam (ATIVAN) injection 0.5 mg  0.5 mg IntraVENous Q4H PRN   ??? insulin lispro (HUMALOG) injection 5 Units  5 Units SubCUTAneous TID WITH MEALS   ??? insulin glargine (LANTUS) injection 20 Units  20 Units SubCUTAneous DAILY   ??? ciprofloxacin (CIPRO) 400 mg IVPB (premix)  400 mg IntraVENous Q12H   ??? metroNIDAZOLE (FLAGYL) IVPB premix 500 mg  500 mg IntraVENous Q6H   ??? acetaminophen (TYLENOL) tablet 650 mg  650 mg Oral Q4H PRN   ??? amLODIPine (NORVASC) tablet 10 mg  10 mg Oral DAILY   ??? cloNIDine HCl (CATAPRES) tablet 0.3 mg  0.3 mg Oral BID    ??? lisinopril (PRINIVIL, ZESTRIL) tablet 5 mg  5 mg Oral DAILY   ??? metoprolol tartrate (LOPRESSOR) tablet 25 mg  25 mg Oral BID   ??? oxyCODONE-acetaminophen (PERCOCET) 5-325 mg per tablet 1 Tab  1 Tab Oral Q4H PRN   ??? QUEtiapine SR (SEROquel XR) tablet 150 mg  150 mg Oral QHS   ??? zolpidem (AMBIEN) tablet 5 mg  5 mg Oral QHS PRN   ??? sodium chloride (NS) flush 5-10 mL  5-10 mL IntraVENous Q8H   ??? sodium chloride (NS) flush 5-10 mL  5-10 mL IntraVENous PRN   ??? naloxone (NARCAN) injection 0.1 mg  0.1 mg IntraVENous PRN   ??? morphine injection 2 mg  2 mg IntraVENous Q4H PRN   ??? albuterol CONCENTRATE 2.5mg /0.5 mL neb soln  2.5 mg Nebulization Q4H PRN   ???  heparin (porcine) injection 5,000 Units  5,000 Units SubCUTAneous Q8H   ??? glucagon (GLUCAGEN) injection 1 mg  1 mg IntraMUSCular PRN   ??? dextrose (D50W) injection syrg 10-15 g  20-30 mL IntraVENous PRN   ??? glucagon (GLUCAGEN) injection 1 mg  1 mg IntraMUSCular PRN   ??? dextrose (D50W) injection syrg 10-15 g  20-30 mL IntraVENous PRN   ??? insulin lispro (HUMALOG) injection   SubCUTAneous AC&HS       Active Problems:    Acute colitis (07/02/2015)      New onset type 1 diabetes mellitus, uncontrolled (HCC) (07/02/2015)      Acute hyperglycemia (07/02/2015)      Chantel Dixon MS, APRN, FNP-C  Office: [phone]  July 03, 2015  9:56 AM

## 2015-07-03 NOTE — Progress Notes (Signed)
PHYSICAL THERAPY EVALUATION       Patient: [name] [name] (65 y.o. female)  Room: CD12/CD12    Date: 07/03/2015  Start Time:  1228  End Time:  1242    Primary Diagnosis: Acute hyperglycemia  New onset type 1 diabetes mellitus, uncontrolled (HCC)  Acute colitis          Precautions: Falls and universal     Orders reviewed, chart reviewed, and initial evaluation completed on [name] [name].  PT POC discussed with nursing.    ASSESSMENT : Pt's c/c is lightheadedness with mobility and changes in position as well as HA.   She is Mod I-I for mobility.  Will continue to follow her while here, she is safe to amb with staff 2-3 x a day if she is not lightheaded.      Based on the objective data described below, the patient presents with   Decreased ADL/Functional Activities  Decreased Transfer Abilities  Decreased Activity Tolerance.    Patient will benefit from acute skilled Physical Therapy interventions to address the above impairments.    Patient???s rehabilitation potential is considered to be Good due toPLOF.    Factors which may influence rehabilitation potential/barriers to progress include:    None noted   Mental ability/status   Medical condition   Home/family situation and support systems   Safety awareness   Pain tolerance/management   Other:      PLAN :  Planned Interventions:  Functional mobility training Gait Training Stair training Therapeutic activities Patient/caregiver education  decrease muscle atrophy/weakness to allow increased function and correct muscle imbalances to allow activities of daily living/work tasks  Frequency/Duration: Patient will be followed by physical therapy 1-3 visits to address goals.    Recommendations:  Physical Therapy and Occupational Therapy  Discharge Recommendations: 24 hour supervision for safety and Outpatient PT  Further Equipment Recommendations for Discharge: Single point cane        Please refer to Patient Education and Care Plan sections of chart for additional details     SUBJECTIVE:   Pt stated: "My hip is a little sore from laying here."    OBJECTIVE DATA SUMMARY:   Present illness history:   Problem List  Date Reviewed: 07-02-15          Codes Class Noted    Acute colitis ICD-10-CM: K52.9  ICD-9-CM: 558.9  07/02/2015        New onset type 1 diabetes mellitus, uncontrolled (HCC) ICD-10-CM: E10.65  ICD-9-CM: 250.03  07/02/2015        Acute hyperglycemia ICD-10-CM: R73.9  ICD-9-CM: 790.29  07/02/2015        Accelerated hypertension ICD-10-CM: I10  ICD-9-CM: 401.0  04/08/2015        Severe headache ICD-10-CM: R51  ICD-9-CM: 784.0  04/07/2015        Osteoarthritis of hips, 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  ICD-9-CM: 715.95  01/29/2015        PTSD (post-traumatic stress disorder) (Chronic) ICD-10-CM: F43.10  ICD-9-CM: 309.81  01/29/2015        Severe hypertension (Chronic) ICD-10-CM: I10  ICD-9-CM: 401.9  07/21/2014             Past Medical history:   Past Medical History   Diagnosis Date   ??? Arthritis    ??? Chronic pain    ??? GERD (gastroesophageal reflux disease)    ??? Hypertension    ??? Liver disease      HEP C   ??? PTSD (post-traumatic stress disorder)  lived through the [place]       PRIOR LEVEL OF FUNCTION / LIVING SITUATION     Information was obtained by:   patient  Home environment:   Lives with Spouse, House, 2 story, Bedroom 2nd floor, Entrance steps 1, Steps inside home one flight  Prior level of function:   Retired, drove, independent without limitations and Ambulates with SPC  Prior level of Instrumental Activities of Daily Living:   Independent with, light housework, preparing full meals and grocery shopping-uses scooter in grocery store sometimes  Home equipment:   Cane    Patient found: Bed.    Pain Assessment before PT session: 0/10  Location:    Pain Assessment after PT session: 0/10   Location:              Yes, patient had pain medications            No, Patient has not had pain medications            Nurse notified    COGNITIVE STATUS:      Mental Status:   Oriented x3  Communication:   normal  Attention Span:   fair(15-49min)  Follows Commands:   complex  General Cognition:   intact   Hearing:   grossly intact  Vision:   glasses    EXTREMITIES ASSESSMENT:      Tone & Sensation:   Normal    Strength:    bilateral, upper extremity(s), lower extremity(s), WFL    Range Of Motion:  bilateral, upper extremity(s), lower extremity(s), WFL    Functional mobility and balance status:     Mobility:  Right Rolling -  independent  Left Rolling -  independent  Supine to sit -  independent  Sit to Supine -  independent  Sit to Stand -  standby assist  Stand to Sit -  standby assist    Transfers:  Patient is independent in all areas.    Balance:  Static Sitting Balance -  normal  Dynamic Sitting Balance -  normal  Static Standing Balance -  good-  Dynamic Standing Balance -  fair+    Activity Tolerance: fair+.    Ambulation/Gait Training:  steady None standby assist  75 feet--pt with lightheadedness and returned to bed immediately with HA.   BP 100/58 mmHG, placed pt supine and alerted nurse.   Further PT deferred per pt's request.     Stair Training:  not tested      TREATMENT:   Patient received/participated in 5 minutes of gait training immediately following evaluation and/or educational instruction during/immediately following PT evaluation.    Activity Tolerance:   fair+    Final Location:   bed    COMMUNICATION/EDUCATION:     Barriers to Learning/Limitations:  None  Education provided patient on  Out of bed 2-3 times/day, Staff assistance with mobility, Changes positions frequently and Safety  Patient / Family readiness to learn indicated by:   trying to perform skills     Thank you for this referral.  De Nurse  Inpatient Rehab extension: [phone]

## 2015-07-03 NOTE — Progress Notes (Signed)
Patient admitted on 07/02/2015 from Home with   Chief Complaint   Patient presents with   ??? Nausea   ??? Vomiting   ??? Diarrhea   ??? Abdominal Pain        The patient has been admitted to the hospital 3 times in the past 12 months.      Tentative dc plan:   Home with home health-referral sent to [facility]. Patient is newly diagnosed with diabetes may need a glucometer upon discharge diabetes educator consult entered.      Facility if plan no    Anticipated Discharge Date:   01/21      PCP: Littie Deeds, MD     Specialists:     none    Dialysis Unit/ chair time / access:    no    Pharmacy:   [facility]    DME:    Gilmer Mor majority of the time but she has a walker    Home Environment:    Lives at [address] [place] [address]    [phone].     Prior to admission open services:     no    Home Health Agency-     no    Personal Care Agency-    no    Extended Emergency Contact Information  Primary Emergency Contact: Garrel Ridgel, [place] [address] [facility] [place]  Home Phone: [phone]  Mobile Phone: [phone]  Relation: Spouse      Transportation:     Husband will transport home    Therapy Recommendations:  OT :y/n     Discharge Recommendations: Home health O  PT :y/n      Discharge Recommendations: 24 hour supervision for safety and Outpatient PT  SLP :y/n     no    RT Home O2 Evaluation :y/n     No current oxygen sat on room air 99%    Wound Care: y/n     no    Change consult (formerly [facility]) :    no    Case Management Assessment    ABUSE/NEGLECT SCREENING   Physical Abuse/Neglect: Denies   Sexual Abuse: Denies   Sexual Abuse: Denies   Other Abuse/Issues: Denies          PRIMARY DECISION MAKER                                   CARE MANAGEMENT INTERVENTIONS   Readmission Interview Completed: Not Applicable   PCP Verified by CM: Yes           Mode of Transport at Discharge: Self       Transition of Care Consult (CM Consult): Discharge Planning, Home Health                    Physical Therapy Consult: Yes   Occupational Therapy Consult: Yes       Current Support Network: Lives with Spouse   Reason for Referral: DCP Rounds   History Provided By: Patient   Patient Orientation: Alert and Oriented   Cognition: Alert   Support System Response: Unavailable   Previous Living Arrangement: Lives with Family Independent       Prior Functional Level: Assistance with the following:, Mobility   Current Functional Level: Assistance with the following:, Mobility   Primary Language: English   Can patient return  to prior living arrangement: Yes   Ability to make needs known:: Fair   Family able to assist with home care needs:: Yes               Types of Needs Identified: Disease Management Education, Treatment Education, Emotional Support   Anticipated Discharge Needs: [place]   Confirm Follow Up Transport: Family       Plan discussed with Pt/Family/Caregiver: Yes   Freedom of Choice Offered: Yes      DISCHARGE LOCATION   Discharge Placement: Home with home health

## 2015-07-03 NOTE — Consults (Addendum)
[facility]   CONSULTATION REPORT  NAME:  [name] [name]  SEX:   female  ADMIT: 07/02/2015  DATE OF CONSULT: 07/03/2015  REFERRING PHYSICIAN:  Dr Estil Daft  DOB: [birth year]  MR#    [id number]  ROOM: CD12/CD12  ACCT#  [id number]      CONSULTING PHYSICIAN:  Dr Grayland Jack    REASON FOR CONSULTATION:  Asthma exacerbation    ASSESSMENT/PLAN:   -LLL mass on CT abd: Will order CT chest without contrast to further evaluate pulmonary structure and mass. May need CT bX     -Mild to moderate persistent Asthma (not exacerbation): Continue Albuterol/Atrovent nebs every 6 hr and PRN. Will Start pt on Symbicort for better maintenance and should be discharged home on this and Albuterol MDI. Will ask COPD/Asthma educator to see to review Inhaler use and technique. Do not suspect PE at this time. Will need PFT as OP in 3 weeks from discharge. Send RAST/IGE level.     -Acute Colitis: Continue Cipro and Flagyl. GI eval pending.     -DM type 2: uncontrolled. Per IM. SSI coverage, Lantus    -HTN: per IM    -Anxiety/Depression: per IM    -Heparin SQ for DVT prophylaxis    Explained assessment and plan to the patient and family and answered questions.   Discussed with Dr. Estil Daft  Discussed with the nurse and RT.      HISTORY OF PRESENT ILLNESS:  [name] [name] is a 65 y.o. female admitted to [facility] on 07/02/2015 from home with reports of N/V for the past 3 days. Seen by PCP for this and was advised day of admission to be seen in ED as symptoms continued. Pt reported diffused abd pain. CT abd showed Colitis and sigmoid diverticulosis. CT also showed LLL nodule. Pt was admitted and started on IV fluids and ABX. Pulmonary consult was called today due to worsening SOB and chest tightness as well as LLL mass. Currently pt is resting in bed on RA with husband at bedside. States her breathing improved after her Neb treatment this am. No fever/chills. No Diarrhea. [place] and was exposed to smoke and chemicals. Also has history of Asthma and was on Albuterol in Past. Over the past month she has reported a gradual decline in her breathing and SOB with any exertion. Pt was started on neb treatments. CTA chest pending to r/O PE.     PAST MEDICAL HISTORY:  Past Medical History   Diagnosis Date   ??? Arthritis    ??? Chronic pain    ??? GERD (gastroesophageal reflux disease)    ??? Hypertension    ??? Liver disease      HEP C   ??? PTSD (post-traumatic stress disorder)      lived through [place]       PAST SURGICAL HISTORY:  Past Surgical History   Procedure Laterality Date   ??? Hx gyn  2010     hysterectomy   ??? Hx endoscopy     ??? 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 Hx hip replacement           SOCIAL HISTORY:  Social History     Social History   ??? Marital status: MARRIED     Spouse name: N/A   ??? Number of children: N/A   ??? Years of education: N/A     Occupational History   ???  Not on file.     Social History Main Topics   ??? Smoking status: Never Smoker   ??? Smokeless tobacco: Never Used   ??? Alcohol use No   ??? Drug use: No   ??? Sexual activity: No     Other Topics Concern   ??? Not on file     Social History Narrative       FAMILY HISTORY:  Family History   Problem Relation Age of Onset   ??? Hypertension Mother    ??? Cancer Maternal Aunt    ??? Alcohol abuse Maternal Grandfather        ALLERGIES:  Allergies   Allergen Reactions   ??? Chocolate [Cocoa] Sneezing       MEDICATIONS:  Prior to Admission Medications   Prescriptions Last Dose Informant Patient Reported? Taking?   SEROQUEL XR 150 mg sr tablet 07/01/2015  No Yes   Sig: TAKE 1 TABLET BY MOUTH NIGHTLY   albuterol (PROVENTIL HFA, VENTOLIN HFA, PROAIR HFA) 90 mcg/actuation inhaler 07/01/2015  No Yes   Sig: Take 2 Puffs by inhalation every six (6) hours as needed for Wheezing or Shortness of Breath.   amLODIPine (NORVASC) 10 mg tablet 07/01/2015  No Yes   Sig: Take 1 Tab by mouth daily.    chlorthalidone (HYGROTEN) 25 mg tablet 07/01/2015  No Yes   Sig: Take 1 Tab by mouth daily.   cloNIDine HCl (CATAPRES) 0.3 mg tablet 07/01/2015  No Yes   Sig: Take 1 Tab by mouth two (2) times a day.   diclofenac EC (VOLTAREN) 75 mg EC tablet 07/01/2015  No Yes   Sig: TAKE 1 TABLET BY MOUTH TWICE A DAY TAKE WITH MEALS   guaiFENesin-codeine (ROBITUSSIN AC) 100-10 mg/5 mL solution 07/01/2015  No Yes   Sig: Take 5 mL by mouth three (3) times daily as needed for Cough. Max Daily Amount: 15 mL. Indications: COUGH   lisinopril (PRINIVIL, ZESTRIL) 5 mg tablet 07/01/2015  No Yes   Sig: TAKE ONE TABLET BY MOUTH ONCE DAILY   metoprolol tartrate (LOPRESSOR) 25 mg tablet 07/01/2015  No Yes   Sig: Take 1 Tab by mouth two (2) times a day.   ondansetron (ZOFRAN ODT) 8 mg disintegrating tablet 07/01/2015 at Unknown time  No Yes   Sig: Take 1 Tab by mouth every eight (8) hours as needed for Nausea.   oxyCODONE-acetaminophen (PERCOCET) 5-325 mg per tablet 07/01/2015  No Yes   Sig: Take 1 Tab by mouth every twelve (12) hours as needed for Pain. Max Daily Amount: 2 Tabs.   zolpidem (AMBIEN) 5 mg tablet 07/01/2015  No Yes   Sig: Take 1 Tab by mouth nightly as needed for Sleep. Max Daily Amount: 5 mg.      Facility-Administered Medications: None       REVIEW OF SYSTEMS:  A full 12 point review of systems was obtained. All pertinent positives noted in HPI otherwise negative.    Vital Signs:    Visit Vitals   ??? BP (!) 123/93   ??? Pulse (!) 105   ??? Temp 98.3 ??F (36.8 ??C)   ??? Resp 21   ??? Ht  (1.702 m)   ??? Wt 81.6 kg (180 lb)   ??? SpO2 99%   ??? BMI 28.19 kg/m2       O2 Device: Room air       Temp (24hrs), Avg:98.7 ??F (37.1 ??C), Min:98.3 ??F (36.8 ??C), Max:98.9 ??F (37.2 ??C)  Patient Vitals for the past 8 hrs:   Temp Pulse Resp BP SpO2   07/03/15 1513 98.3 ??F (36.8 ??C) - - - -   07/03/15 1058 - (!) 105 21 (!) 123/93 99 %   07/03/15 1034 98.9 ??F (37.2 ??C) 100 21 102/83 97 %   07/03/15 0755 - - - - 99 %       PHYSICAL EXAMINATION:   HEENT: NC/AT, PERRLA, Oral Mucosa pink moist    Neck: Supple, trachea midline, voice clear   CV: S1S2 noted, No R/G.   Resp: Symmetrical chest expansion, Bilateral breath sounds decreased with slight exp wheeze. On RA  GI: Soft NT with active BS  Ext: no edema, + pulses    Neuro: A/Ox3, Normal affect and mood. No gross neurologic abnormality noted.    LABORATORY DATA:  Labs: Results:   ABG   No results found for: PH, PHI, PCO2, PCO2I, PO2, PO2I, HCO3, HCO3I, FIO2, FIO2I   Chemistry Recent Labs      07/03/15   0530  07/02/15   1808  07/02/15   1203  07/02/15   1101   GLU  231*  263*   --   499*   NA  141  134*   --   131*   K  3.3*  3.0*   --   2.8*   CL  102  96*   --   89*   CO2  32  29  39*  31   BUN  13  16   --   22   CREA  0.8  0.9   --   1.4*   CA  8.3*  8.3*   --   9.7   AP   --    --    --   85   TP   --    --    --   8.6*   ALB   --    --    --   3.6    Estimated Creatinine Clearance: 79.1 mL/min (based on Cr of 0.8).   CBC w/Diff Recent Labs      07/03/15   0530  07/02/15   1101   WBC  10.3  20.3*   RBC  4.43  5.48*   HGB  13.0  15.6   HCT  37.9  44.4   PLT  172  263   GRANS  51.1  73.6*   LYMPH  40.3  19.2*   EOS  1.4  0.1      BTNP No results found for: BNP, BNPP, BNPPPOC, XBNPT, BNPNT   Cardiac Enzymes No results for input(s): CPK, CKND1, MYO in the last 72 hours.    No lab exists for component: CKRMB, TROIP   Coagulation No results for input(s): PTP, INR, APTT in the last 72 hours.    No lab exists for component: INREXT    Hepatitis Panel No results found for: HAMAT, HAAB, HABT, HAAT, XHBAGT, HBSAG, HBAGT, HBSB, HBSAT, HBABN, HBCM, HBCAB, HBCAT, XBCABS, HBEAB, HBEAG, HCAB, XHEPCS, [id number], HBEGLT, HBCMLT, HBCLT, HBEBLT, [id number], [id number], HAVMLT, [id number], HBCMLT, [id number], HCGAT   Amylase Lipase    Liver Enzymes Recent Labs      07/02/15   1101   TP  8.6*   ALB  3.6   AP  85   SGOT  7*   ALT  15      Thyroid Studies No results for input(s): T4, T3U, TSH, TSHEXT in  the last 72 hours.     No lab exists for component: T3RU     UA No results for input(s): UGLU in the last 72 hours.    No lab exists for component: SOURCEUR, UBILIRUBIN, UKET, USPG, UOCB, UPH, UPSC, UUROBILISQ, UNITR, URINELEUKOC   Blood cultures No results found for: CULT, CULT1, CULT2, GMS    No results for input(s): CULT, CULT1, CULT2, GMS in the last 72 hours.    No results found for: CULT, CULT1, CULT2, GMS   Cultures All Micro Results     Procedure Component Value Units Date/Time    CULTURE, BLOOD [[id number]] Collected:  07/02/15 1400    Order Status:  Completed Specimen:  Blood from Blood Updated:  07/03/15 0724     Blood Culture Result         Culture In Progress, Daily Updates To Follow    CULTURE, BLOOD [[id number]] Collected:  07/02/15 1415    Order Status:  Completed Specimen:  Blood from Blood Updated:  07/03/15 0724     Blood Culture Result         Culture In Progress, Daily Updates To Follow    C. DIFFICILE/EPI PCR [[id number]]     Order Status:  No result Specimen:  Stool     CULTURE, STOOL [[id number]]     Order Status:  No result Specimen:  Stool     OVA & PARASITES, STOOL [[id number]]     Order Status:  No result Specimen:  Stool from Stool     STOOL CULTURE FOR YERSINIA [[id number]]     Order Status:  Sent Specimen:  Stool from Stool          Pathology pathology      Xr Chest Sngl V    Result Date: 07/03/2015  History: Acute hyperglycemia, short of breath AP chest: Compared to 07/02/2015 the heart is normal in size. Patchy airspace disease persists left mid to lower zone slightly increased with similar degree of inspiration. No effusion.     IMPRESSION: Right basilar pneumonia. PA lateral views chest recommended when the patient is able.               Vickie Doscher, NP  July 03, 2015  3:30 PM    Interviewed and examined independently. Agree with NP's documentation with my edits.   Faythe Ghee MD

## 2015-07-03 NOTE — Progress Notes (Signed)
CM attempt to do discharge plan interview, patient currently not on the floor.

## 2015-07-03 NOTE — Progress Notes (Signed)
NUTRITION RECOMMENDATIONS:   - Advance to Consistent CHO 1800 when medically able.  - Please weigh pt and chart in medical record when able      NUTRITION INITIAL EVALUATION    NUTRITION ASSESSMENT:       Reason for assessment: new diabetes diagnosis    Admitting diagnosis: Acute hyperglycemia  New onset type 1 diabetes mellitus, uncontrolled (HCC)  Acute colitis      PMH:   Past Medical History   Diagnosis Date   ??? Arthritis    ??? Chronic pain    ??? GERD (gastroesophageal reflux disease)    ??? Hypertension    ??? Liver disease      HEP C   ??? PTSD (post-traumatic stress disorder)      lived through the [place]        Anthropometrics:  Height: Ht Readings from Last 3 Encounters:   07/02/15  (1.702 m)   06/29/15  (1.702 m)   06/16/15  (1.702 m) ??       Weight: Wt Readings from Last 3 Encounters:   07/02/15 81.6 kg (180 lb)   06/16/15 88.5 kg (195 lb)   05/26/15 87.5 kg (193 lb) ??       ?? IBW: 61 kg     ?? % IBW: 134%    ?? BMI: Body mass index is 28.19 kg/(m^2).    ?? UBW: 88.5 kg per medical record - question accuracy of state weights and fluid fluctuation.     ?? Wt change: pt denies wt loss   Diet and intake history:  ?? Current diet order: DIET DIABETIC FULL LIQUID    ?? Food allergies: chocolate    ?? Diet/intake history: two meals high in CHO - e.g. Spaghetti with meatballs or hamburger with fries      <50% intake x >5 days   <50% intake x >1 month   <75% intake x >7 days   <75% intake x 1 month   <75% intake x 3 months    ?? Current appetite/PO intake: good    ?? Assessment of current MNT: Full liquid diet not adequate to meet needs. Advance as medically able.    ?? Cultural, religious, and ethnic food preferences identified: N/A   Physical Assessment:  ?? GI symptoms: diarrhea    ?? Chewing/swallowing issues: none    ?? Fluid accumulation: ascites    ?? Mental status: alert and oriented   Intake and output:    Intake/Output Summary (Last 24 hours) at 07/03/15 1308   Last data filed at 07/03/15 0900   Gross per 24 hour   Intake          1053.28 ml   Output              650 ml   Net           403.28 ml            Living situation: Patient lives at home with family/caregiver     Current pertinent medications: SSI, lantus, insulin regular, phenergan, KCl    Pertinent labs: glucose 493, 351    NUTRITION DIAGNOSIS:     Altered nutrition-related lab value (HgbA1C) related to new DM diagnosis as evidenced by HgbA1C 8.4%.    NUTRITION INTERVENTION:     Recommended diet: 1800 consistent CHO when medically able    NUTRITION MONITORING AND EVALUATION:     Nutrition  level of care: low    Nutrition monitoring: PO intake, nutrition-related labs, wt, medical change of condition     Nutrition goals: PO intake >75%, nutrition-related labs WNL, wt will remain stable + 2lbs over LOS       NUTRITION glycemic control evaluation    Education history: no previous education    Current diet order: DIET DIABETIC FULL LIQUID    Compliance history:  N/A    Biochemical data:  Blood glucose at hospital: 493, 351   HgbA1c:   Lab Results   Component Value Date/Time    Hemoglobin A1c 8.4 07/02/2015 10:40 AM    Hemoglobin A1c, External 6.6 01/08/2014     Average blood glucose at home:  N/A    Current DM medications: SSI, lantus, insulin regular    Home DM medications: none    Education needs: new DM education  ______________________________________________________________________    Diet instructed on: Healthy Food Choices for Diabetes, MyPlate for visual of carbohydrate portion control     Education modifications: none      Barriers to learning/intervention: new diagnosis, healthy literacy    Method of Instruction: verbal, written, teach back    Patient comprehension: fair    Expected compliance: fair to poor without follow-up    Comments: encouraged follow-up at [facility]. Provided referral information.    Della Goo, [place]  Pager:  [phone]    07/03/15

## 2015-07-03 NOTE — Progress Notes (Signed)
Pt started c/o shortness of breath while lying in bed. Sat's 100% on room air, vitals signs stable. Oxygen 2/liters applied via nasal cannula. Notified Dr. Estil Daft of pt's symptoms, Md ordered STAT portable chest  Xray, respiratory notified for breathing treatment. Shift report given to Elon Jester, RN at bedside to assume care of pt.    07:30  Dr. Carin Primrose here at bedside evaluating patient

## 2015-07-03 NOTE — Progress Notes (Signed)
Problem: Mobility Impaired (Adult and Pediatric)  Goal: *Acute Goals and Plan of Care (Insert Text)  07/03/2015 PT GOALS, NNT In one week, pt will:  1) perform all functional transfers I in order to return to PLOF  2) amb 300??? with no LOB in order to navigate her environment at home after DC  3) demo I and good safety awareness with all functional mobility in order to prevent a fall   .

## 2015-07-03 NOTE — Progress Notes (Signed)
[facility]  Face to Face Encounter    Patient???s Name: [name] [name]           Date of Birth: 08-07-1950    Primary Diagnosis: Acute hyperglycemia  New onset type 1 diabetes mellitus, uncontrolled (HCC)  Acute colitis                    Admit Date: 07/02/2015    Date of Face to Face:  July 03, 2015                    Medical Record Number: [id number]     Attending: Madaline Savage, MD      Physician Attestation:  To be filled out by physician who conducted Face-to Face encounter.    Current Problem List:  The encounter with the patient was in whole, or in part, for the following medical condition, which is the primary reason home health care (list medical condition):    Patient Active Hospital Problem List:   Acute colitis (07/02/2015)     New onset type 1 diabetes mellitus, uncontrolled (HCC) (07/02/2015)       Acute hyperglycemia (07/02/2015)                              Face to Face Encounter findings are related to primary reason for home care:   yes.     1. I certify that the patient needs intermittent care as follows: skilled nursing care:  skilled observation/assessment, patient education, complex care plan management and administration of medications  physical therapy: strengthening, stretching/ROM, transfer training, gait/stair training, balance training and pt/caregiver education  occupational therapy:  ADL safety (ie. cooking, bathing, dressing), ROM and pt/caregiver education    2. I certify that this patient is homebound, that is: 1) patient requires the use of a cane and walker device, special transportation, or assistance of another to leave the home; or 2) patient's condition makes leaving the home medically contraindicated; and 3) patient has a normal inability to leave the home and leaving the home requires considerable and taxing effort.  Patient may leave the home for infrequent and short duration for medical reasons, and occasional absences for non-medical reasons.  Homebound status is due to the following functional limitations: Patient with strength deficits limiting the performance of all ADL's without caregiver assistance or the use of an assistive device.    3. I certify that this patient is under my care and that I, or a nurse practitioner or physician???s assistant, or clinical nurse specialist, or certified nurse midwife, working with me, had a Face-to-Face Encounter that meets the physician Face-to-Face Encounter requirements.  The following are the clinical findings from the Face-to-Face encounter that support the need for skilled services and is a summary of the encounter:      See discharge summary    Electronically signed:  Inis Sizer, RN  07/03/2015      THE FOLLOWING TO BE COMPLETED BY THE  PHYSICIAN:    I concur with the findings described above from the F2F encounter that this patient is homebound and in need of a skilled service.    Electronically signed:  Certifying Physician:

## 2015-07-03 NOTE — Progress Notes (Signed)
Hospitalist Progress Note    Patient: [name] [name]               Sex: female          DOA: 07/02/2015       Date of Birth:  [birth year]      Age:  65 y.o.        LOS:  LOS: 1 day     PCP: Lake Bells, MD   Assessment and Plan:   Uncontrolled DM new onset with Hyperglycemia  -ketosis improved  -now on Lantus and correctional dose insulin.    Acute respiratory distress with shortness of breath.  -saturations better on 2 liters, component of anxiety.  -CTA chest STAT to evaluate for PE  -Continue Supplemental Oxygen to keep saturation above 92%  -CXR  -hold maintenance fluids  -Ledft lower lobe mass -NEW, Pulmonary evaluation    Acute Left sided Colitis  -continue Iv Cipro, Flagyl  -GI evaluation  -WBC improved, no fever  -monitor Diarrhea, check for C.diff    HTN  -stable    Anxiety/ depression  -continue Seroquel  -added Ativan PRN    DVT ppx  -on heparin subcut    Problem List:  Active Problems:    Acute colitis (07/02/2015)      New onset type 1 diabetes mellitus, uncontrolled ([place]) (07/02/2015)      Acute hyperglycemia (07/02/2015)           Subjective: shortness of breath, no chest pain     [name] [name] is a 65 y.o. year old female who is being seen for shortness of breath, DM    Objective: anxious in bed.      Vital Signs:  Visit Vitals   ??? BP 109/76 (BP 1 Location: Left arm, BP Patient Position: At rest)   ??? Pulse 96   ??? Temp 98.8 ??F (37.1 ??C)   ??? Resp 19   ??? Ht 5' 7"  (1.702 m)   ??? Wt 81.6 kg (180 lb)   ??? SpO2 99%   ??? BMI 28.19 kg/m2     Physical Exam:  General appearance: alert, cooperative.  Head: Normocephalic,atraumatic  Eyes:pale, anicteric.  Lungs:rales on auscultation, diminished basal air entry.  Heart: regular rate and rhythm, S1, S2 normal  Abdomen: soft, non-tender. Bowel sounds normal.   Extremities: no cyanosis or edema.  Skin:  No rashes or lesions  Neurologic: CN Grossly normal, Extremity strength bilaterally 5/5    Lab Results:  Recent Results (from the past 24 hour(s))    EKG, 12 LEAD, INITIAL    Collection Time: 07/02/15 10:20 AM   Result Value Ref Range    Ventricular Rate 117 BPM    Atrial Rate 117 BPM    P-R Interval 142 ms    QRS Duration 76 ms    Q-T Interval 354 ms    QTC Calculation (Bezet) 493 ms    Calculated P Axis 66 degrees    Calculated R Axis 2 degrees    Calculated T Axis 56 degrees    Diagnosis       Sinus tachycardia  Left ventricular hypertrophy with repolarization abnormality  ST depression, lateral leads, consider ischemia versus finding secondary to   left ventricular hypertrophy  Possible right atrial enlargement  Abnormal ECG  When compared with ECG of 09-Apr-2015 14:58,  Vent. rate has increased BY  40 BPM  ST depression is now present in the lateral leads  Confirmed by Berkley Harvey, MD, Sarah ([id number]) on  07/02/2015 4:07:04 PM     HEMOGLOBIN A1C W/O EAG    Collection Time: 07/02/15 10:40 AM   Result Value Ref Range    Hemoglobin A1c 8.4 (H) 4.8 - 6.0 %   CBC WITH AUTOMATED DIFF    Collection Time: 07/02/15 11:01 AM   Result Value Ref Range    WBC 20.3 (H) 4.0 - 11.0 1000/mm3    RBC 5.48 (H) 3.60 - 5.20 M/uL    HGB 15.6 13.0 - 17.2 gm/dl    HCT 44.4 37.0 - 50.0 %    MCV 81.0 80.0 - 98.0 fL    MCH 28.5 25.4 - 34.6 pg    MCHC 35.1 30.0 - 36.0 gm/dl    PLATELET 263 140 - 450 1000/mm3    MPV 12.0 (H) 6.0 - 10.0 fL    RDW-SD 38.6 36.4 - 46.3      NRBC 0 0 - 0      IMMATURE GRANULOCYTES 0.3 0.0 - 3.0 %    NEUTROPHILS 73.6 (H) 34 - 64 %    LYMPHOCYTES 19.2 (L) 28 - 48 %    MONOCYTES 6.6 1 - 13 %    EOSINOPHILS 0.1 0 - 5 %    BASOPHILS 0.2 0 - 3 %   METABOLIC PANEL, COMPREHENSIVE    Collection Time: 07/02/15 11:01 AM   Result Value Ref Range    Sodium 131 (L) 136 - 145 mEq/L    Potassium 2.8 (LL) 3.5 - 5.1 mEq/L    Chloride 89 (L) 98 - 107 mEq/L    CO2 31 21 - 32 mEq/L    Glucose 499 (HH) 74 - 106 mg/dl    BUN 22 7 - 25 mg/dl    Creatinine 1.4 (H) 0.6 - 1.3 mg/dl    GFR est AA 49.0      GFR est non-AA 40      Calcium 9.7 8.5 - 10.1 mg/dl    AST 7 (L) 15 - 37 U/L     ALT 15 12 - 78 U/L    Alk. phosphatase 85 45 - 117 U/L    Bilirubin, total 1.4 (H) 0.2 - 1.0 mg/dl    Protein, total 8.6 (H) 6.4 - 8.2 gm/dl    Albumin 3.6 3.4 - 5.0 gm/dl   LIPASE    Collection Time: 07/02/15 11:01 AM   Result Value Ref Range    Lipase 120 73 - 393 U/L   POC URINE MACROSCOPIC    Collection Time: 07/02/15 11:03 AM   Result Value Ref Range    Glucose 500 (A) NEGATIVE,Negative mg/dl    Bilirubin Negative NEGATIVE,Negative      Ketone 15 (A) NEGATIVE,Negative mg/dl    Specific gravity <=1.005 1.005 - 1.030      Blood Small (A) NEGATIVE,Negative      pH (UA) 5.5 5 - 9      Protein Negative NEGATIVE,Negative mg/dl    Urobilinogen 0.2 0.0 - 1.0 EU/dl    Nitrites Negative NEGATIVE,Negative      Leukocyte Esterase Trace (A) NEGATIVE,Negative      Color Yellow      Appearance Slightly Cloudy     POC URINE MICROSCOPIC    Collection Time: 07/02/15 11:03 AM   Result Value Ref Range    EPITHELIAL CELLS, SQUAMOUS 5-9 /LPF    WBC 1-4 /HPF    RBC 1-4 /HPF    Bacteria OCCASIONAL /HPF   GLUCOSE, POC    Collection Time: 07/02/15 11:14 AM  Result Value Ref Range    Glucose (POC) >500 (HH) 65 - 105 mg/dL   GLUCOSE, POC    Collection Time: 07/02/15 11:16 AM   Result Value Ref Range    Glucose (POC) 493 (HH) 65 - 105 mg/dL   POC BLOOD GAS    Collection Time: 07/02/15 12:03 PM   Result Value Ref Range    pH 7.477 (H) 7.310 - 7.410      PCO2 50.2 41.0 - 51.0 mm Hg    PO2 NO VALUE 35 - 40 mm Hg    BICARBONATE 37.1 (H) 22.0 - 26.0 mmol/L    O2 SAT NO VALUE 70 - 75 %    CO2, TOTAL 39 (H) 21 - 32 mmol/L    BASE EXCESS 14 (H) -2 - 3 mmol/L    Sample type VEN     CULTURE, BLOOD    Collection Time: 07/02/15  2:00 PM   Result Value Ref Range    Blood Culture Result Culture In Progress, Daily Updates To Follow     MAGNESIUM    Collection Time: 07/02/15  2:11 PM   Result Value Ref Range    Magnesium 2.1 1.8 - 2.4 mg/dl   CULTURE, BLOOD    Collection Time: 07/02/15  2:15 PM   Result Value Ref Range     Blood Culture Result Culture In Progress, Daily Updates To Follow     GLUCOSE, POC    Collection Time: 07/02/15  3:04 PM   Result Value Ref Range    Glucose (POC) 345 (H) 65 - 105 mg/dL   GLUCOSE, POC    Collection Time: 07/02/15  4:27 PM   Result Value Ref Range    Glucose (POC) 138 (H) 65 - 381 mg/dL   METABOLIC PANEL, BASIC    Collection Time: 07/02/15  6:08 PM   Result Value Ref Range    Sodium 134 (L) 136 - 145 mEq/L    Potassium 3.0 (L) 3.5 - 5.1 mEq/L    Chloride 96 (L) 98 - 107 mEq/L    CO2 29 21 - 32 mEq/L    Glucose 263 (H) 74 - 106 mg/dl    BUN 16 7 - 25 mg/dl    Creatinine 0.9 0.6 - 1.3 mg/dl    GFR est AA >60.0      GFR est non-AA >60      Calcium 8.3 (L) 8.5 - 10.1 mg/dl   GLUCOSE, POC    Collection Time: 07/02/15  6:18 PM   Result Value Ref Range    Glucose (POC) 269 (H) 65 - 105 mg/dL   GLUCOSE, POC    Collection Time: 07/02/15  7:30 PM   Result Value Ref Range    Glucose (POC) 351 (H) 65 - 105 mg/dL   GLUCOSE, POC    Collection Time: 07/02/15  9:05 PM   Result Value Ref Range    Glucose (POC) 296 (H) 65 - 105 mg/dL   GLUCOSE, POC    Collection Time: 07/03/15  4:40 AM   Result Value Ref Range    Glucose (POC) 195 (H) 65 - 105 mg/dL   CBC WITH AUTOMATED DIFF    Collection Time: 07/03/15  5:30 AM   Result Value Ref Range    WBC 10.3 4.0 - 11.0 1000/mm3    RBC 4.43 3.60 - 5.20 M/uL    HGB 13.0 13.0 - 17.2 gm/dl    HCT 37.9 37.0 - 50.0 %    MCV 85.6 80.0 -  98.0 fL    MCH 29.3 25.4 - 34.6 pg    MCHC 34.3 30.0 - 36.0 gm/dl    PLATELET 172 140 - 450 1000/mm3    MPV 12.2 (H) 6.0 - 10.0 fL    RDW-SD 42.1 36.4 - 46.3      NRBC 0 0 - 0      IMMATURE GRANULOCYTES 0.1 0.0 - 3.0 %    NEUTROPHILS 51.1 34 - 64 %    LYMPHOCYTES 40.3 28 - 48 %    MONOCYTES 6.8 1 - 13 %    EOSINOPHILS 1.4 0 - 5 %    BASOPHILS 0.3 0 - 3 %   METABOLIC PANEL, BASIC    Collection Time: 07/03/15  5:30 AM   Result Value Ref Range    Sodium 141 136 - 145 mEq/L    Potassium 3.3 (L) 3.5 - 5.1 mEq/L    Chloride 102 98 - 107 mEq/L     CO2 32 21 - 32 mEq/L    Glucose 231 (H) 74 - 106 mg/dl    BUN 13 7 - 25 mg/dl    Creatinine 0.8 0.6 - 1.3 mg/dl    GFR est AA >60.0      GFR est non-AA >60      Calcium 8.3 (L) 8.5 - 10.1 mg/dl       Images:  Xr Chest Sngl V    Result Date: 07/02/2015  AP chest. HISTORY: Shortness of breath. Mass identified in the left lung on recent abdomen CT scan. FINDINGS: There is a well-circumscribed nodule identified in the left lower lung field on CT scan. With associated minimal infiltrative change in left base. Remaining lungs are clear. Cardiac silhouette is normal.     IMPRESSION: Well-circumscribed nodule in the left lung base with associated minimal infiltrative change.     Ct Abd Pelv W Cont    Result Date: 07/02/2015  CT abdomen and pelvis with intravenous contrast. Comparison 11/18/2014. HISTORY: Vomiting and diarrhea. TECHNIQUE: Exam with nonionic intravenous contrast. FINDINGS: Benign-appearing calcification within the left breast. Bullous changes are seen in the upper lobes. And left lower lobe. There is a well-circumscribed mass identified in the left lower lobe image 3 series 4 measuring 1.9 cm. Not present on the previous examination. Liver, gallbladder and spleen normal. Stomach unremarkable. Normal pancreas. No adrenal masses. Small cyst within the right kidney. Tiny nonobstructing stones in the right kidney. Tiny nonobstructing stones in the left kidney. Small left renal cysts. Abdominal aorta normal in size. Normal-appearing appendix. Bowel gas pattern shows thickening of the colon from the splenic flexure down to the sigmoid colon. Most likely compatible with colitis. There are scattered diverticula identified within the sigmoid colon. Favor colitis over diverticulitis. Right hip prosthesis. Bladder outline normal. Lower facet arthropathy.     IMPRESSION: 1. Left-sided colitis. 2. Sigmoid colon diverticulosis. 3. There is a mass identified within the left lower lobe measuring 1.9 cm not  present on previous study. Dedicated chest CT recommended for further evaluation. 4. Bullous disease within the lungs. 5. Bilateral nonobstructing renal stones. 6. Benign-appearing calcification within the left breast. 7. Right hip prosthesis        Medications:  Current Facility-Administered Medications   Medication Dose Route Frequency   ??? albuterol-ipratropium (DUO-NEB) 2.5 MG-0.5 MG/3 ML  3 mL Nebulization Q6H RT   ??? LORazepam (ATIVAN) injection 0.5 mg  0.5 mg IntraVENous Q4H PRN   ??? insulin glargine (LANTUS) injection 20 Units  20 Units SubCUTAneous Q24H   ???  insulin lispro (HUMALOG) injection 5 Units  5 Units SubCUTAneous TID WITH MEALS   ??? ciprofloxacin (CIPRO) 400 mg IVPB (premix)  400 mg IntraVENous Q12H   ??? metroNIDAZOLE (FLAGYL) IVPB premix 500 mg  500 mg IntraVENous Q6H   ??? acetaminophen (TYLENOL) tablet 650 mg  650 mg Oral Q4H PRN   ??? amLODIPine (NORVASC) tablet 10 mg  10 mg Oral DAILY   ??? cloNIDine HCl (CATAPRES) tablet 0.3 mg  0.3 mg Oral BID   ??? lisinopril (PRINIVIL, ZESTRIL) tablet 5 mg  5 mg Oral DAILY   ??? metoprolol tartrate (LOPRESSOR) tablet 25 mg  25 mg Oral BID   ??? oxyCODONE-acetaminophen (PERCOCET) 5-325 mg per tablet 1 Tab  1 Tab Oral Q4H PRN   ??? QUEtiapine SR (SEROquel XR) tablet 150 mg  150 mg Oral QHS   ??? zolpidem (AMBIEN) tablet 5 mg  5 mg Oral QHS PRN   ??? sodium chloride (NS) flush 5-10 mL  5-10 mL IntraVENous Q8H   ??? sodium chloride (NS) flush 5-10 mL  5-10 mL IntraVENous PRN   ??? naloxone (NARCAN) injection 0.1 mg  0.1 mg IntraVENous PRN   ??? morphine injection 2 mg  2 mg IntraVENous Q4H PRN   ??? albuterol CONCENTRATE 2.70m/0.5 mL neb soln  2.5 mg Nebulization Q4H PRN   ??? heparin (porcine) injection 5,000 Units  5,000 Units SubCUTAneous Q8H   ??? glucagon (GLUCAGEN) injection 1 mg  1 mg IntraMUSCular PRN   ??? dextrose (D50W) injection syrg 10-15 g  20-30 mL IntraVENous PRN   ??? glucagon (GLUCAGEN) injection 1 mg  1 mg IntraMUSCular PRN    ??? dextrose (D50W) injection syrg 10-15 g  20-30 mL IntraVENous PRN   ??? insulin lispro (HUMALOG) injection   SubCUTAneous AC&HS       Total of 35 minutes of which more than 50% was spent in coordination of care and counseling  ??  [facility] MD  July 03, 2015  7:39 AM

## 2015-07-04 LAB — T4, FREE: Free T4: 2.47 ng/dl — ABNORMAL HIGH (ref 0.76–1.46)

## 2015-07-04 LAB — GLUCOSE, POC
Glucose (POC): 220 mg/dL — ABNORMAL HIGH (ref 65–105)
Glucose (POC): 285 mg/dL — ABNORMAL HIGH (ref 65–105)
Glucose (POC): 134 mg/dL — ABNORMAL HIGH (ref 65–105)
Glucose (POC): 102 mg/dL (ref 65–105)

## 2015-07-04 LAB — TSH 3RD GENERATION: TSH: 0.633 u[IU]/mL (ref 0.358–3.740)

## 2015-07-04 MED ORDER — MORPHINE 2 MG/ML INJECTION
2 mg/mL | INTRAMUSCULAR | Status: DC | PRN
Start: 2015-07-04 — End: 2015-07-04

## 2015-07-04 MED ORDER — CIPROFLOXACIN 250 MG TAB
250 mg | Freq: Two times a day (BID) | ORAL | Status: DC
Start: 2015-07-04 — End: 2015-07-07
  Administered 2015-07-04 – 2015-07-07 (×7): via ORAL

## 2015-07-04 MED ORDER — METRONIDAZOLE 250 MG TAB
250 mg | Freq: Three times a day (TID) | ORAL | Status: DC
Start: 2015-07-04 — End: 2015-07-07
  Administered 2015-07-04 – 2015-07-07 (×9): via ORAL

## 2015-07-04 MED ORDER — IPRATROPIUM-ALBUTEROL 2.5 MG-0.5 MG/3 ML NEB SOLUTION
2.5 mg-0.5 mg/3 ml | RESPIRATORY_TRACT | Status: DC | PRN
Start: 2015-07-04 — End: 2015-07-07

## 2015-07-04 MED ORDER — INSULIN GLARGINE 100 UNIT/ML INJECTION
100 unit/mL | Freq: Every day | SUBCUTANEOUS | Status: DC
Start: 2015-07-04 — End: 2015-07-07
  Administered 2015-07-05 – 2015-07-07 (×3): via SUBCUTANEOUS

## 2015-07-04 MED FILL — HEPARIN (PORCINE) 5,000 UNIT/ML IJ SOLN: 5000 unit/mL | INTRAMUSCULAR | Qty: 1

## 2015-07-04 MED FILL — CLONIDINE 0.2 MG TAB: 0.2 mg | ORAL | Qty: 1

## 2015-07-04 MED FILL — METOPROLOL TARTRATE 25 MG TAB: 25 mg | ORAL | Qty: 1

## 2015-07-04 MED FILL — METRONIDAZOLE IN SODIUM CHLORIDE (ISO-OSM) 500 MG/100 ML IV PIGGY BACK: 500 mg/100 mL | INTRAVENOUS | Qty: 100

## 2015-07-04 MED FILL — CIPROFLOXACIN 250 MG TAB: 250 mg | ORAL | Qty: 2

## 2015-07-04 MED FILL — OXYCODONE-ACETAMINOPHEN 5 MG-325 MG TAB: 5-325 mg | ORAL | Qty: 1

## 2015-07-04 MED FILL — METRONIDAZOLE 250 MG TAB: 250 mg | ORAL | Qty: 2

## 2015-07-04 MED FILL — AMLODIPINE 5 MG TAB: 5 mg | ORAL | Qty: 2

## 2015-07-04 MED FILL — MORPHINE 2 MG/ML INJECTION: 2 mg/mL | INTRAMUSCULAR | Qty: 1

## 2015-07-04 MED FILL — IPRATROPIUM-ALBUTEROL 2.5 MG-0.5 MG/3 ML NEB SOLUTION: 2.5 mg-0.5 mg/3 ml | RESPIRATORY_TRACT | Qty: 3

## 2015-07-04 MED FILL — SEROQUEL XR 50 MG TABLET,EXTENDED RELEASE: 50 mg | ORAL | Qty: 3

## 2015-07-04 MED FILL — LISINOPRIL 5 MG TAB: 5 mg | ORAL | Qty: 1

## 2015-07-04 MED FILL — CIPROFLOXACIN IN D5W 400 MG/200 ML IV PIGGY BACK: 400 mg/200 mL | INTRAVENOUS | Qty: 200

## 2015-07-04 MED FILL — BD POSIFLUSH NORMAL SALINE 0.9 % INJECTION SYRINGE: INTRAMUSCULAR | Qty: 10

## 2015-07-04 NOTE — Progress Notes (Signed)
[facility]        Progress Note     Name: [name] [name]   DOB: [birth year]   MRN: [id number]   Date: 07/04/2015    I have reviewed the flowsheet and previous day???s notes. Events, vitals, medications and notes from last 24 hours reviewed.       ASSESSMENT/PLAN:   -1.3cm LLL mass with 2.5cm right thyroid mass on CT chest: LLL is suspicious for mets or primary lung ca. Recommend CT biopsy of the LLL nodule. Discussed with Dr. Estil Daft and he will discus with IR.   ??  -Mild to moderate persistent Asthma (not exacerbation): Continue Albuterol/Atrovent nebs every 6 hr and PRN. Continue Symbicort for better maintenance and should be discharged home on this and Albuterol MDI. Will need PFT as OP.  RAST/IGE level pending.     -Prominent emphysematous changes with scattered blebs and bullae throughout all 5 lobes. Largest bullae within the left upper lobe roughly measures 8.8 x 6.7 cm. No history of significant smoking. Will check A1AT level. Will need PFTS as outpatient.   ??  -Acute Colitis: Continue Cipro and Flagyl per IM.  ??  -DM type 2: Management per IM.   ??  -HTN: per IM  ??  -Anxiety/Depression: per IM  ??  -Heparin SQ for DVT prophylaxis  ??  Explained assessment and plan to the patient and answered questions.   Discussed with Dr. Estil Daft                SUBJECTIVE:  Breathing better.     ROS:   Otherwise negative except for as mentioned in subjective.     Allergy:  Allergies   Allergen Reactions   ??? Chocolate [Cocoa] Sneezing        Vital Signs:    Visit Vitals   ??? BP 94/71   ??? Pulse 81   ??? Temp 97.5 ??F (36.4 ??C)   ??? Resp 18   ??? Ht  (1.702 m)   ??? Wt 81.6 kg (180 lb)   ??? SpO2 96%   ??? BMI 28.19 kg/m2       O2 Device: Room air       Temp (24hrs), Avg:98.2 ??F (36.8 ??C), Min:97.4 ??F (36.3 ??C), Max:99.2 ??F (37.3 ??C)     Patient Vitals for the past 8 hrs:   Temp Pulse Resp BP SpO2   07/04/15 0958 97.5 ??F (36.4 ??C) 81 18 94/71 96 %   07/04/15 0833 98.4 ??F (36.9 ??C) - - - -    07/04/15 0757 - 80 17 (!) 122/93 99 %   07/04/15 0732 - 85 18 (!) 120/93 100 %   07/04/15 0457 97.4 ??F (36.3 ??C) 84 18 119/89 97 %         Intake/Output:     Last shift:         Last 3 shifts: 01/19 1901 - 01/21 0700  In: 1600 [P.O.:1200; I.V.:400]  Out: 1750 [Urine:1750]    Intake/Output Summary (Last 24 hours) at 07/04/15 1154  Last data filed at 07/04/15 0434   Gross per 24 hour   Intake             1240 ml   Output             1100 ml   Net              140 ml          Physical  Exam:  HEENT: NC/AT, PERRLA, Oral Mucosa pink moist    Neck: Supple, trachea midline, voice clear   CV: S1S2 noted, No R/G.   Resp: Symmetrical chest expansion, Bilateral breath sounds. No wheeze or rales.   Abd: Soft NT with active BS  Ext: no edema, + pulses       DATA:     Current Facility-Administered Medications   Medication Dose Route Frequency   ??? albuterol-ipratropium (DUO-NEB) 2.5 MG-0.5 MG/3 ML  3 mL Nebulization Q4H PRN   ??? ciprofloxacin HCl (CIPRO) tablet 500 mg  500 mg Oral Q12H   ??? [START ON 07/05/2015] insulin glargine (LANTUS) injection 25 Units  25 Units SubCUTAneous DAILY   ??? metroNIDAZOLE (FLAGYL) tablet 500 mg  500 mg Oral TID   ??? LORazepam (ATIVAN) injection 0.5 mg  0.5 mg IntraVENous Q4H PRN   ??? insulin lispro (HUMALOG) injection 5 Units  5 Units SubCUTAneous TID WITH MEALS   ??? budesonide-formoterol (SYMBICORT) 160-4.5 mcg/actuation HFA inhaler 2 Puff  2 Puff Inhalation BID   ??? acetaminophen (TYLENOL) tablet 650 mg  650 mg Oral Q4H PRN   ??? cloNIDine HCl (CATAPRES) tablet 0.3 mg  0.3 mg Oral BID   ??? oxyCODONE-acetaminophen (PERCOCET) 5-325 mg per tablet 1 Tab  1 Tab Oral Q4H PRN   ??? QUEtiapine SR (SEROquel XR) tablet 150 mg  150 mg Oral QHS   ??? zolpidem (AMBIEN) tablet 5 mg  5 mg Oral QHS PRN   ??? sodium chloride (NS) flush 5-10 mL  5-10 mL IntraVENous Q8H   ??? sodium chloride (NS) flush 5-10 mL  5-10 mL IntraVENous PRN   ??? naloxone (NARCAN) injection 0.1 mg  0.1 mg IntraVENous PRN    ??? morphine injection 2 mg  2 mg IntraVENous Q4H PRN   ??? albuterol CONCENTRATE 2.5mg /0.5 mL neb soln  2.5 mg Nebulization Q4H PRN   ??? heparin (porcine) injection 5,000 Units  5,000 Units SubCUTAneous Q8H   ??? glucagon (GLUCAGEN) injection 1 mg  1 mg IntraMUSCular PRN   ??? dextrose (D50W) injection syrg 10-15 g  20-30 mL IntraVENous PRN   ??? insulin lispro (HUMALOG) injection   SubCUTAneous AC&HS             Labs:  Reviewed.   Recent Results (from the past 24 hour(s))   GLUCOSE, POC    Collection Time: 07/03/15  4:17 PM   Result Value Ref Range    Glucose (POC) 122 (H) 65 - 105 mg/dL   GLUCOSE, POC    Collection Time: 07/03/15  9:16 PM   Result Value Ref Range    Glucose (POC) 220 (H) 65 - 105 mg/dL   GLUCOSE, POC    Collection Time: 07/04/15  8:34 AM   Result Value Ref Range    Glucose (POC) 285 (H) 65 - 105 mg/dL         Imaging:  I have personally reviewed imaging.         Zoe Lan, MD

## 2015-07-04 NOTE — Progress Notes (Signed)
Problem: Diabetes Self-Management  Goal: *Disease process and treatment process  Define diabetes and identify own type of diabetes; list 3 options for treating diabetes.   Outcome: Progressing Towards Goal  bs monitored, ssi and scheduled insulin given, will monitor

## 2015-07-04 NOTE — Progress Notes (Signed)
Problem: Falls - Risk of  Goal: *Absence of falls  Outcome: Progressing Towards Goal  Pt remains safe and protected from fall.

## 2015-07-04 NOTE — Progress Notes (Signed)
Hospitalist Progress Note    Patient: [name] [name]               Sex: female          DOA: 07/02/2015       Date of Birth:  04-18-1951      Age:  65 y.o.        LOS:  LOS: 2 days     PCP: [name] Deeds, MD   Assessment and Plan:   Uncontrolled DM new onset with Hyperglycemia  -ketosis improved  -now on Lantus and correctional dose insulin.  ??  Acute respiratory distress with possible asthma exacerbation-moderate persistent asthma.  -continue Neb therapy  -Pulmonary evaluation appreciated.  -Continue Supplemental Oxygen to keep saturation above 92%  -Left lower lobe nodule? Suspicious for malignancy/  Plan CT guided biopsy, possibly Monday.  ??  New thyroid lesion  -check TSH/T4  -thyroid US  -radiology opinion on biopsy and further evaluation.    Acute Left sided Colitis  -continue Iv Cipro, Flagyl- switch to oral.  -GI evaluation done.  -WBC improved, no fever  -monitor Diarrhea.  ??  HTN  -stable  ??  Anxiety/ depression  -continue Seroquel  ??  DVT ppx  -on heparin subcut  ??    Subjective: shortness of breath     [name] [name] is a 65 y.o. year old female who is being seen for  Uncontrolled DM, diarrhea.    Objective: comfortable in bed.      Vital Signs:  Visit Vitals   ??? BP 94/71   ??? Pulse 81   ??? Temp 97.5 ??F (36.4 ??C)   ??? Resp 18   ??? Ht  (1.702 m)   ??? Wt 81.6 kg (180 lb)   ??? SpO2 96%   ??? BMI 28.19 kg/m2       Physical Exam:  General appearance: alert, cooperative, no distress  Head: Normocephalic,atraumatic  Eyes:pale, anicteric.  Lungs: no rales, no rhonchi on auscultation.  Heart: regular rate and rhythm, S1, S2 normal, no murmur  Abdomen: soft, non-tender. Bowel sounds normal.   Extremities: no cyanosis or edema  Neurologic: CN Grossly normal, Extremity strength bilaterally 5/5    Intake and Output:  Last three shifts:  01/19 1901 - 01/21 0700  In: 1600 [P.O.:1200; I.V.:400]  Out: 1750 [Urine:1750]    Lab Results:  Recent Results (from the past 24 hour(s))   GLUCOSE, POC     Collection Time: 07/03/15  4:17 PM   Result Value Ref Range    Glucose (POC) 122 (H) 65 - 105 mg/dL   GLUCOSE, POC    Collection Time: 07/03/15  9:16 PM   Result Value Ref Range    Glucose (POC) 220 (H) 65 - 105 mg/dL   GLUCOSE, POC    Collection Time: 07/04/15  8:34 AM   Result Value Ref Range    Glucose (POC) 285 (H) 65 - 105 mg/dL       Images:  Ct Chest Wo Cont    Result Date: 07/03/2015  CHEST CT WITHOUT CONTRAST INDICATION: Follow-up lung nodule TECHNIQUE: Contiguous axial CT images of the chest were obtained from the thoracic inlet to the upper abdomen without the use of intravenous contrast. COMPARISON: Chest radiograph dated 07/02/2015. FINDINGS: There is a 2.5 x 1.5 cm right thyroid lobe hypodensity. A few scattered mediastinal lymph nodes, which are not pathologically enlarged. No axillary or definite hilar lymphadenopathy. The heart is normal in size. No pericardial effusion.  Prominent emphysematous changes with scattered blebs and bullae throughout all 5 lobes. Largest bullae within the left upper lobe roughly measures 8.8 x 6.7 cm. There is a 1.3 x 1.3 cm left lower lobe nodular density. A few scattered areas of subsegmental atelectasis and/or scarring. Left lung base atelectasis and/or scarring. Visualized abdominal structures grossly unremarkable. No aggressive osseous lesions.     IMPRESSION: 1. A 1.3 x 1.3 cm left lower lobe nodular density. Follow-up per Fleischner criteria if tissue biopsy is not obtained. 2. A 2.5 x 1.5 cm right thyroid lobe hypodense lesion. 3. Prominent emphysematous changes with scattered blebs and bullae throughout all 5 lobes. FLEISCHNER SOCIETY RECOMMENDATIONS, INCIDENTAL PULMONARY NODULE Nodule size (millimeters) LOW RISK PATIENT (Minimal or absent history of smoking and of other known risk factors) </= 4 mm No followup needed* > 4 o 6 mm Followup CT scan at 12 months, if unchanged no further followup* > 6 to 8 mm Initial followup CT  at 6 to 12 months, then at 18 - 24 months if no change. > 8 mm Followup CT scan at around 3, 9 and 24 months. Consider dynamic contrast-enhanced CT scan, PET scan, and/or biopsy. HIGH RISK PATIENT (History of smoking or other risk factors) </= 4 mm Followup CT scan 12 months, if unchanged no further followup. > 4 to 6 mm Initial followup at 6 to 12 months, then at 18 - 24 months if no change. > 6 to 8 mm Initial followup CT at 3 to 6 months then at 9 - 12 months and 24 months if no change >8 mm Followup CT scan at around 3, 9 and 24 months, dynamic contrast CT scan, PET and/or biopsy. Newly detected indeterminate nodules in persons 48 years of age or older: - average of length and width. - minimal or absence history of smoking and of other known risk factors (low risk patients) - history of smoking or of other known risk factors (high risk patients) *Risk of malignancy in this category (less than 1%) substantially less than in a baseline CT scan of an asymptomatic smoker. Non solid (ground-glass) or partly solid nodules may require longer followup to exclude indolent adenocarcinoma.        Medications:  Current Facility-Administered Medications   Medication Dose Route Frequency   ??? albuterol-ipratropium (DUO-NEB) 2.5 MG-0.5 MG/3 ML  3 mL Nebulization Q4H PRN   ??? LORazepam (ATIVAN) injection 0.5 mg  0.5 mg IntraVENous Q4H PRN   ??? insulin lispro (HUMALOG) injection 5 Units  5 Units SubCUTAneous TID WITH MEALS   ??? insulin glargine (LANTUS) injection 20 Units  20 Units SubCUTAneous DAILY   ??? budesonide-formoterol (SYMBICORT) 160-4.5 mcg/actuation HFA inhaler 2 Puff  2 Puff Inhalation BID   ??? ciprofloxacin (CIPRO) 400 mg IVPB (premix)  400 mg IntraVENous Q12H   ??? metroNIDAZOLE (FLAGYL) IVPB premix 500 mg  500 mg IntraVENous Q6H   ??? acetaminophen (TYLENOL) tablet 650 mg  650 mg Oral Q4H PRN   ??? amLODIPine (NORVASC) tablet 10 mg  10 mg Oral DAILY   ??? cloNIDine HCl (CATAPRES) tablet 0.3 mg  0.3 mg Oral BID    ??? lisinopril (PRINIVIL, ZESTRIL) tablet 5 mg  5 mg Oral DAILY   ??? metoprolol tartrate (LOPRESSOR) tablet 25 mg  25 mg Oral BID   ??? oxyCODONE-acetaminophen (PERCOCET) 5-325 mg per tablet 1 Tab  1 Tab Oral Q4H PRN   ??? QUEtiapine SR (SEROquel XR) tablet 150 mg  150 mg Oral QHS   ??? zolpidem (  AMBIEN) tablet 5 mg  5 mg Oral QHS PRN   ??? sodium chloride (NS) flush 5-10 mL  5-10 mL IntraVENous Q8H   ??? sodium chloride (NS) flush 5-10 mL  5-10 mL IntraVENous PRN   ??? naloxone (NARCAN) injection 0.1 mg  0.1 mg IntraVENous PRN   ??? morphine injection 2 mg  2 mg IntraVENous Q4H PRN   ??? albuterol CONCENTRATE 2.5mg /0.5 mL neb soln  2.5 mg Nebulization Q4H PRN   ??? heparin (porcine) injection 5,000 Units  5,000 Units SubCUTAneous Q8H   ??? glucagon (GLUCAGEN) injection 1 mg  1 mg IntraMUSCular PRN   ??? dextrose (D50W) injection syrg 10-15 g  20-30 mL IntraVENous PRN   ??? insulin lispro (HUMALOG) injection   SubCUTAneous AC&HS       Madaline Savage, MD  July 04, 2015  11:49 AM

## 2015-07-04 NOTE — Progress Notes (Signed)
Bedside and Verbal shift change report given to Khristine, RN (oncoming nurse) by Airrion Otting, RN (offgoing nurse). Report included the following information Kardex, Procedure Summary, Intake/Output, MAR and Recent Results.

## 2015-07-05 ENCOUNTER — Inpatient Hospital Stay: Admit: 2015-07-05 | Payer: MEDICARE | Primary: Physician Assistant

## 2015-07-05 LAB — METABOLIC PANEL, BASIC
BUN: 11 mg/dl (ref 7–25)
CO2: 30 mEq/L (ref 21–32)
Calcium: 8.6 mg/dl (ref 8.5–10.1)
Chloride: 102 mEq/L (ref 98–107)
Creatinine: 0.7 mg/dl (ref 0.6–1.3)
GFR est AA: >60
GFR est non-AA: >60
Glucose: 157 mg/dl — ABNORMAL HIGH (ref 74–106)
Potassium: 3.4 mEq/L — ABNORMAL LOW (ref 3.5–5.1)
Sodium: 140 mEq/L (ref 136–145)

## 2015-07-05 LAB — CBC WITH AUTOMATED DIFF
BASOPHILS: 0.6 % (ref 0–3)
EOSINOPHILS: 2.9 % (ref 0–5)
HCT: 34.8 % — ABNORMAL LOW (ref 37.0–50.0)
HGB: 11.8 gm/dl — ABNORMAL LOW (ref 13.0–17.2)
IMMATURE GRANULOCYTES: 0.1 % (ref 0.0–3.0)
LYMPHOCYTES: 34.9 % (ref 28–48)
MCH: 28.9 pg (ref 25.4–34.6)
MCHC: 33.9 gm/dl (ref 30.0–36.0)
MCV: 85.1 fL (ref 80.0–98.0)
MONOCYTES: 6.2 % (ref 1–13)
MPV: 12.6 fL — ABNORMAL HIGH (ref 6.0–10.0)
NEUTROPHILS: 55.3 % (ref 34–64)
NRBC: 0 (ref 0–0)
PLATELET: 161 10*3/uL (ref 140–450)
RBC: 4.09 M/uL (ref 3.60–5.20)
RDW-SD: 42.5 (ref 36.4–46.3)
WBC: 6.9 10*3/uL (ref 4.0–11.0)

## 2015-07-05 LAB — GLUCOSE, POC
Glucose (POC): 142 mg/dL — ABNORMAL HIGH (ref 65–105)
Glucose (POC): 294 mg/dL — ABNORMAL HIGH (ref 65–105)
Glucose (POC): 124 mg/dL — ABNORMAL HIGH (ref 65–105)
Glucose (POC): 204 mg/dL — ABNORMAL HIGH (ref 65–105)

## 2015-07-05 LAB — ALLERGY NE REGIONAL MIX PANEL 2

## 2015-07-05 MED ORDER — INSULIN LISPRO 100 UNIT/ML INJECTION
100 unit/mL | Freq: Three times a day (TID) | SUBCUTANEOUS | Status: DC
Start: 2015-07-05 — End: 2015-07-07
  Administered 2015-07-05 – 2015-07-07 (×5): via SUBCUTANEOUS

## 2015-07-05 MED FILL — CIPROFLOXACIN 250 MG TAB: 250 mg | ORAL | Qty: 2

## 2015-07-05 MED FILL — METRONIDAZOLE 250 MG TAB: 250 mg | ORAL | Qty: 2

## 2015-07-05 MED FILL — MORPHINE 2 MG/ML INJECTION: 2 mg/mL | INTRAMUSCULAR | Qty: 1

## 2015-07-05 MED FILL — HEPARIN (PORCINE) 5,000 UNIT/ML IJ SOLN: 5000 unit/mL | INTRAMUSCULAR | Qty: 1

## 2015-07-05 MED FILL — SEROQUEL XR 50 MG TABLET,EXTENDED RELEASE: 50 mg | ORAL | Qty: 3

## 2015-07-05 MED FILL — OXYCODONE-ACETAMINOPHEN 5 MG-325 MG TAB: 5-325 mg | ORAL | Qty: 1

## 2015-07-05 MED FILL — BD POSIFLUSH NORMAL SALINE 0.9 % INJECTION SYRINGE: INTRAMUSCULAR | Qty: 10

## 2015-07-05 MED FILL — CLONIDINE 0.2 MG TAB: 0.2 mg | ORAL | Qty: 1

## 2015-07-05 NOTE — Progress Notes (Signed)
Problem: Falls - Risk of  Goal: *Absence of falls  Outcome: Progressing Towards Goal  Pt remains safe and protected from fall.

## 2015-07-05 NOTE — Progress Notes (Signed)
Problem: Pain  Goal: *Control of Pain  Outcome: Progressing Towards Goal  Pain medication given as ordered, reassessment and hourly rounding continues, will monitor

## 2015-07-05 NOTE — Progress Notes (Signed)
Bedside and Verbal shift change report given to Khrisitine, RN (oncoming nurse) by Tavaras Goody, RN (offgoing nurse). Report included the following information Kardex, Procedure Summary, Intake/Output, MAR and Recent Results.

## 2015-07-05 NOTE — Other (Signed)
Bedside and Verbal shift change report given to Brittney D Taylor, RN (oncoming nurse) by Khristine, RN (offgoing nurse). Report included the following information SBAR, Kardex, Intake/Output, MAR and Recent Results.

## 2015-07-05 NOTE — Other (Addendum)
Dr Estil Daft text paged about pt's bp, 142/114 then 140/95 and 133/106. Pt asymptomatic

## 2015-07-05 NOTE — Progress Notes (Signed)
[facility]        Progress Note     Name: [name] [name]   DOB: [birth year]   MRN: [id number]   Date: 07/05/2015    I have reviewed the flowsheet and previous day???s notes. Events, vitals, medications and notes from last 24 hours reviewed.       ASSESSMENT/PLAN:   -1.3cm LLL mass with 2.5cm right thyroid mass on CT chest: LLL is suspicious for mets or primary lung ca. Scheduled for CT biopsy of the LLL nodule in am.   ??  -Mild to moderate persistent Asthma (not exacerbation): Improved. Continue Albuterol/Atrovent nebs every 6 hr and PRN. Continue Symbicort for better maintenance and should be discharged home on this and Albuterol MDI. Will need PFT as OP.  RAST/IGE level pending.     -Prominent emphysematous changes with scattered blebs and bullae throughout all 5 lobes. Largest bullae within the left upper lobe roughly measures 8.8 x 6.7 cm. No history of significant smoking. A1AT level pending. Will need PFTS as outpatient.   ??  -Acute Colitis: Continue Cipro and Flagyl per IM.  ??  -DM type 2: Management per IM.   ??  -HTN: per IM  ??  -Anxiety/Depression: per IM  ??  -Heparin SQ for DVT prophylaxis              SUBJECTIVE:  No SOB    ROS:   Otherwise negative except for as mentioned in subjective.     Allergy:  Allergies   Allergen Reactions   ??? Chocolate [Cocoa] Sneezing        Vital Signs:    Visit Vitals   ??? BP 115/79 (BP 1 Location: Right arm, BP Patient Position: Supine)   ??? Pulse 91   ??? Temp 98.3 ??F (36.8 ??C)   ??? Resp 18   ??? Ht  (1.702 m)   ??? Wt 81.6 kg (180 lb)   ??? SpO2 99%   ??? BMI 28.19 kg/m2       O2 Device: Room air       Temp (24hrs), Avg:98.1 ??F (36.7 ??C), Min:97.8 ??F (36.6 ??C), Max:98.3 ??F (36.8 ??C)       Patient Vitals for the past 8 hrs:   Temp Pulse Resp BP SpO2   07/05/15 0732 98.3 ??F (36.8 ??C) 91 18 115/79 99 %   07/05/15 0510 98.2 ??F (36.8 ??C) 82 18 (!) 127/97 98 %         Intake/Output:     Last shift:         Last 3 shifts: 01/20 1901 - 01/22 0700   In: 640 [P.O.:240; I.V.:400]  Out: 1100 [Urine:1100]    Intake/Output Summary (Last 24 hours) at 07/05/15 1152  Last data filed at 07/04/15 2231   Gross per 24 hour   Intake              240 ml   Output                0 ml   Net              240 ml          Physical Exam:  HEENT: NC/AT, PERRLA, Oral Mucosa pink moist    Neck: Supple, trachea midline, voice clear   CV: S1S2 noted, No R/G.   Resp: Symmetrical chest expansion, Bilateral breath sounds. No wheeze or rales.   Abd: Soft NT with active BS  Ext: no edema, + pulses       DATA:     Current Facility-Administered Medications   Medication Dose Route Frequency   ??? insulin lispro (HUMALOG) injection 8 Units  8 Units SubCUTAneous TID WITH MEALS   ??? albuterol-ipratropium (DUO-NEB) 2.5 MG-0.5 MG/3 ML  3 mL Nebulization Q4H PRN   ??? ciprofloxacin HCl (CIPRO) tablet 500 mg  500 mg Oral Q12H   ??? insulin glargine (LANTUS) injection 25 Units  25 Units SubCUTAneous DAILY   ??? metroNIDAZOLE (FLAGYL) tablet 500 mg  500 mg Oral TID   ??? LORazepam (ATIVAN) injection 0.5 mg  0.5 mg IntraVENous Q4H PRN   ??? budesonide-formoterol (SYMBICORT) 160-4.5 mcg/actuation HFA inhaler 2 Puff  2 Puff Inhalation BID   ??? acetaminophen (TYLENOL) tablet 650 mg  650 mg Oral Q4H PRN   ??? cloNIDine HCl (CATAPRES) tablet 0.3 mg  0.3 mg Oral BID   ??? oxyCODONE-acetaminophen (PERCOCET) 5-325 mg per tablet 1 Tab  1 Tab Oral Q4H PRN   ??? QUEtiapine SR (SEROquel XR) tablet 150 mg  150 mg Oral QHS   ??? zolpidem (AMBIEN) tablet 5 mg  5 mg Oral QHS PRN   ??? sodium chloride (NS) flush 5-10 mL  5-10 mL IntraVENous Q8H   ??? sodium chloride (NS) flush 5-10 mL  5-10 mL IntraVENous PRN   ??? naloxone (NARCAN) injection 0.1 mg  0.1 mg IntraVENous PRN   ??? morphine injection 2 mg  2 mg IntraVENous Q4H PRN   ??? albuterol CONCENTRATE 2.5mg /0.5 mL neb soln  2.5 mg Nebulization Q4H PRN   ??? heparin (porcine) injection 5,000 Units  5,000 Units SubCUTAneous Q8H   ??? glucagon (GLUCAGEN) injection 1 mg  1 mg IntraMUSCular PRN    ??? dextrose (D50W) injection syrg 10-15 g  20-30 mL IntraVENous PRN   ??? insulin lispro (HUMALOG) injection   SubCUTAneous AC&HS             Labs:  Reviewed.   Recent Results (from the past 24 hour(s))   GLUCOSE, POC    Collection Time: 07/04/15 12:29 PM   Result Value Ref Range    Glucose (POC) 134 (H) 65 - 105 mg/dL   TSH 3RD GENERATION    Collection Time: 07/04/15  1:48 PM   Result Value Ref Range    TSH 0.633 0.358 - 3.740 uIU/mL   T4, FREE    Collection Time: 07/04/15  1:48 PM   Result Value Ref Range    Free T4 2.47 (H) 0.76 - 1.46 ng/dl   GLUCOSE, POC    Collection Time: 07/04/15  4:04 PM   Result Value Ref Range    Glucose (POC) 102 65 - 105 mg/dL   GLUCOSE, POC    Collection Time: 07/04/15  9:15 PM   Result Value Ref Range    Glucose (POC) 142 (H) 65 - 105 mg/dL   METABOLIC PANEL, BASIC    Collection Time: 07/05/15  4:53 AM   Result Value Ref Range    Sodium 140 136 - 145 mEq/L    Potassium 3.4 (L) 3.5 - 5.1 mEq/L    Chloride 102 98 - 107 mEq/L    CO2 30 21 - 32 mEq/L    Glucose 157 (H) 74 - 106 mg/dl    BUN 11 7 - 25 mg/dl    Creatinine 0.7 0.6 - 1.3 mg/dl    GFR est AA >16.1      GFR est non-AA >60      Calcium  8.6 8.5 - 10.1 mg/dl   CBC WITH AUTOMATED DIFF    Collection Time: 07/05/15  4:53 AM   Result Value Ref Range    WBC 6.9 4.0 - 11.0 1000/mm3    RBC 4.09 3.60 - 5.20 M/uL    HGB 11.8 (L) 13.0 - 17.2 gm/dl    HCT 16.1 (L) 09.6 - 50.0 %    MCV 85.1 80.0 - 98.0 fL    MCH 28.9 25.4 - 34.6 pg    MCHC 33.9 30.0 - 36.0 gm/dl    PLATELET 045 409 - 811 1000/mm3    MPV 12.6 (H) 6.0 - 10.0 fL    RDW-SD 42.5 36.4 - 46.3      NRBC 0 0 - 0      IMMATURE GRANULOCYTES 0.1 0.0 - 3.0 %    NEUTROPHILS 55.3 34 - 64 %    LYMPHOCYTES 34.9 28 - 48 %    MONOCYTES 6.2 1 - 13 %    EOSINOPHILS 2.9 0 - 5 %    BASOPHILS 0.6 0 - 3 %   GLUCOSE, POC    Collection Time: 07/05/15  7:28 AM   Result Value Ref Range    Glucose (POC) 294 (H) 65 - 105 mg/dL         Imaging:  I have personally reviewed imaging.         Zoe Lan, MD

## 2015-07-05 NOTE — Other (Deleted)
Dear Dr. Estil Daft,    Patient is admitted for Diabetes with hypoglycemia.   PER DR. Grayland Jack Progress note   -Mild to moderate persistent Asthma (not exacerbation): Improved  PER ATTENDING DR ZOXWRUEAV  Acute respiratory distress with possible asthma exacerbation-moderate persistent asthma    Please document if ASTHMA EXACERBATION is RULED IN, RULED OUT or RESOLVED.    Thank You,  Teresa Coombs MD, CCS, CCDS, CDIP  Clinical Documentation Specialist  [phone]

## 2015-07-05 NOTE — Progress Notes (Signed)
Hospitalist Progress Note    Patient: [name] [name]               Sex: female          DOA: 07/02/2015       Date of Birth:  13-Feb-1951      Age:  65 y.o.        LOS:  LOS: 3 days     PCP: Littie Deeds, MD   Assessment and Plan:   Uncontrolled DM new onset with Hyperglycemia  -increased Lantus and correctional dose insulin  ????  Acute respiratory distress with possible asthma exacerbation-moderate persistent asthma.  -continue Neb therapy  -Pulmonary evaluation done  -Breathing better, saturations are better on Room Air  -Continue Supplemental Oxygen to keep saturation above 92%  -Left lower lobe nodule? Suspicious for malignancy, D/W Radiology Dr. Allena Katz and planned for CT guided biopsy, planned for  Monday.  ????  New thyroid lesion with hypersecreting thyroid  -elevated T4, normal TSH.  - Thyroid US pending   -D/W radiology on biopsy and further evaluation.  ??  Acute Left sided Colitis  -continue Iv Cipro, Flagyl- switch to oral.  -GI evaluation done.  -WBC improved, no fever  ????  HTN  -stable  ????  Anxiety/ depression  -continue Seroquel    Acute on chronic right hip pain  -possible super imposed sciatica, good strength.  X ray right hip.  PT evaluation  -pain control  -patient has outpt Surgery evaluation with ortho and spine surgery.    ????  DVT ppx  -on heparin subcut    Subjective: right hip pain, no fever, SOB better.     [name] [name] is a 65 y.o. year old female who is being seen for DM, asthma..    Objective: comfortable in bed.      Vital Signs:  Visit Vitals   ??? BP 115/79 (BP 1 Location: Right arm, BP Patient Position: Supine)   ??? Pulse 91   ??? Temp 98.3 ??F (36.8 ??C)   ??? Resp 18   ??? Ht  (1.702 m)   ??? Wt 81.6 kg (180 lb)   ??? SpO2 99%   ??? BMI 28.19 kg/m2       Physical Exam:  General appearance: alert, cooperative  Head: Normocephalic,atraumatic  Eyes:pale, anicteric.   Lungs: no rales, no rhonchi on auscultation.  Heart: regular rate and rhythm, S1, S2 normal, no murmur   Abdomen: soft, non-tender. Bowel sounds normal.   Extremities: no cyanosis or edema  Neurologic: CN Grossly normal, Extremity strength bilaterally 4/5    Intake and Output:  Last three shifts:  01/20 1901 - 01/22 0700  In: 640 [P.O.:240; I.V.:400]  Out: 1100 [Urine:1100]    Lab Results:  Recent Results (from the past 24 hour(s))   GLUCOSE, POC    Collection Time: 07/04/15 12:29 PM   Result Value Ref Range    Glucose (POC) 134 (H) 65 - 105 mg/dL   TSH 3RD GENERATION    Collection Time: 07/04/15  1:48 PM   Result Value Ref Range    TSH 0.633 0.358 - 3.740 uIU/mL   T4, FREE    Collection Time: 07/04/15  1:48 PM   Result Value Ref Range    Free T4 2.47 (H) 0.76 - 1.46 ng/dl   GLUCOSE, POC    Collection Time: 07/04/15  4:04 PM   Result Value Ref Range    Glucose (POC) 102 65 - 105 mg/dL  GLUCOSE, POC    Collection Time: 07/04/15  9:15 PM   Result Value Ref Range    Glucose (POC) 142 (H) 65 - 105 mg/dL   METABOLIC PANEL, BASIC    Collection Time: 07/05/15  4:53 AM   Result Value Ref Range    Sodium 140 136 - 145 mEq/L    Potassium 3.4 (L) 3.5 - 5.1 mEq/L    Chloride 102 98 - 107 mEq/L    CO2 30 21 - 32 mEq/L    Glucose 157 (H) 74 - 106 mg/dl    BUN 11 7 - 25 mg/dl    Creatinine 0.7 0.6 - 1.3 mg/dl    GFR est AA >14.7      GFR est non-AA >60      Calcium 8.6 8.5 - 10.1 mg/dl   CBC WITH AUTOMATED DIFF    Collection Time: 07/05/15  4:53 AM   Result Value Ref Range    WBC 6.9 4.0 - 11.0 1000/mm3    RBC 4.09 3.60 - 5.20 M/uL    HGB 11.8 (L) 13.0 - 17.2 gm/dl    HCT 82.9 (L) 56.2 - 50.0 %    MCV 85.1 80.0 - 98.0 fL    MCH 28.9 25.4 - 34.6 pg    MCHC 33.9 30.0 - 36.0 gm/dl    PLATELET 130 865 - 784 1000/mm3    MPV 12.6 (H) 6.0 - 10.0 fL    RDW-SD 42.5 36.4 - 46.3      NRBC 0 0 - 0      IMMATURE GRANULOCYTES 0.1 0.0 - 3.0 %    NEUTROPHILS 55.3 34 - 64 %    LYMPHOCYTES 34.9 28 - 48 %    MONOCYTES 6.2 1 - 13 %    EOSINOPHILS 2.9 0 - 5 %    BASOPHILS 0.6 0 - 3 %   GLUCOSE, POC    Collection Time: 07/05/15  7:28 AM    Result Value Ref Range    Glucose (POC) 294 (H) 65 - 105 mg/dL      Medications:  Current Facility-Administered Medications   Medication Dose Route Frequency   ??? albuterol-ipratropium (DUO-NEB) 2.5 MG-0.5 MG/3 ML  3 mL Nebulization Q4H PRN   ??? ciprofloxacin HCl (CIPRO) tablet 500 mg  500 mg Oral Q12H   ??? insulin glargine (LANTUS) injection 25 Units  25 Units SubCUTAneous DAILY   ??? metroNIDAZOLE (FLAGYL) tablet 500 mg  500 mg Oral TID   ??? LORazepam (ATIVAN) injection 0.5 mg  0.5 mg IntraVENous Q4H PRN   ??? insulin lispro (HUMALOG) injection 5 Units  5 Units SubCUTAneous TID WITH MEALS   ??? budesonide-formoterol (SYMBICORT) 160-4.5 mcg/actuation HFA inhaler 2 Puff  2 Puff Inhalation BID   ??? acetaminophen (TYLENOL) tablet 650 mg  650 mg Oral Q4H PRN   ??? cloNIDine HCl (CATAPRES) tablet 0.3 mg  0.3 mg Oral BID   ??? oxyCODONE-acetaminophen (PERCOCET) 5-325 mg per tablet 1 Tab  1 Tab Oral Q4H PRN   ??? QUEtiapine SR (SEROquel XR) tablet 150 mg  150 mg Oral QHS   ??? zolpidem (AMBIEN) tablet 5 mg  5 mg Oral QHS PRN   ??? sodium chloride (NS) flush 5-10 mL  5-10 mL IntraVENous Q8H   ??? sodium chloride (NS) flush 5-10 mL  5-10 mL IntraVENous PRN   ??? naloxone (NARCAN) injection 0.1 mg  0.1 mg IntraVENous PRN   ??? morphine injection 2 mg  2 mg IntraVENous Q4H PRN   ???  albuterol CONCENTRATE 2.5mg /0.5 mL neb soln  2.5 mg Nebulization Q4H PRN   ??? heparin (porcine) injection 5,000 Units  5,000 Units SubCUTAneous Q8H   ??? glucagon (GLUCAGEN) injection 1 mg  1 mg IntraMUSCular PRN   ??? dextrose (D50W) injection syrg 10-15 g  20-30 mL IntraVENous PRN   ??? insulin lispro (HUMALOG) injection   SubCUTAneous AC&HS     Madaline Savage, MD  July 05, 2015  10:00 AM

## 2015-07-06 ENCOUNTER — Inpatient Hospital Stay: Admit: 2015-07-06 | Payer: MEDICARE | Primary: Physician Assistant

## 2015-07-06 LAB — CBC WITH AUTOMATED DIFF
BASOPHILS: 0.4 % (ref 0–3)
EOSINOPHILS: 2.7 % (ref 0–5)
HCT: 35.5 % — ABNORMAL LOW (ref 37.0–50.0)
HGB: 11.9 gm/dl — ABNORMAL LOW (ref 13.0–17.2)
IMMATURE GRANULOCYTES: 0.3 % (ref 0.0–3.0)
LYMPHOCYTES: 32.3 % (ref 28–48)
MCH: 28.8 pg (ref 25.4–34.6)
MCHC: 33.5 gm/dl (ref 30.0–36.0)
MCV: 86 fL (ref 80.0–98.0)
MONOCYTES: 6.9 % (ref 1–13)
MPV: 11.8 fL — ABNORMAL HIGH (ref 6.0–10.0)
NEUTROPHILS: 57.4 % (ref 34–64)
NRBC: 0 (ref 0–0)
PLATELET: 181 10*3/uL (ref 140–450)
RBC: 4.13 M/uL (ref 3.60–5.20)
RDW-SD: 42.4 (ref 36.4–46.3)
WBC: 7.1 10*3/uL (ref 4.0–11.0)

## 2015-07-06 LAB — METABOLIC PANEL, BASIC
BUN: 12 mg/dl (ref 7–25)
CO2: 28 mEq/L (ref 21–32)
Calcium: 8.6 mg/dl (ref 8.5–10.1)
Chloride: 101 mEq/L (ref 98–107)
Creatinine: 0.8 mg/dl (ref 0.6–1.3)
GFR est AA: >60
GFR est non-AA: >60
Glucose: 209 mg/dl — ABNORMAL HIGH (ref 74–106)
Potassium: 3.5 mEq/L (ref 3.5–5.1)
Sodium: 137 mEq/L (ref 136–145)

## 2015-07-06 LAB — PROTHROMBIN TIME + INR
INR: 1.1 (ref 0.1–1.1)
Prothrombin time: 14.3 seconds — ABNORMAL HIGH (ref 11.5–14.0)

## 2015-07-06 LAB — GLUCOSE, POC
Glucose (POC): 190 mg/dL — ABNORMAL HIGH (ref 65–105)
Glucose (POC): 185 mg/dL — ABNORMAL HIGH (ref 65–105)
Glucose (POC): 160 mg/dL — ABNORMAL HIGH (ref 65–105)
Glucose (POC): 277 mg/dL — ABNORMAL HIGH (ref 65–105)

## 2015-07-06 MED ORDER — FLUMAZENIL 0.1 MG/ML IV SOLN
0.1 mg/mL | INTRAVENOUS | Status: DC | PRN
Start: 2015-07-06 — End: 2015-07-06

## 2015-07-06 MED ORDER — LIDOCAINE HCL 1 % (10 MG/ML) IJ SOLN
10 mg/mL (1 %) | INTRAMUSCULAR | Status: DC | PRN
Start: 2015-07-06 — End: 2015-07-06
  Administered 2015-07-06 (×5): via SUBCUTANEOUS

## 2015-07-06 MED ORDER — NALOXONE 0.4 MG/ML INJECTION
0.4 mg/mL | INTRAMUSCULAR | Status: DC | PRN
Start: 2015-07-06 — End: 2015-07-06

## 2015-07-06 MED ORDER — MIDAZOLAM 1 MG/ML IJ SOLN
1 mg/mL | INTRAMUSCULAR | Status: DC | PRN
Start: 2015-07-06 — End: 2015-07-06

## 2015-07-06 MED ORDER — METOPROLOL TARTRATE 25 MG TAB
25 mg | Freq: Two times a day (BID) | ORAL | Status: DC
Start: 2015-07-06 — End: 2015-07-07
  Administered 2015-07-06 – 2015-07-07 (×3): via ORAL

## 2015-07-06 MED ORDER — FLUMAZENIL 0.1 MG/ML IV SOLN
0.1 mg/mL | INTRAVENOUS | Status: AC
Start: 2015-07-06 — End: 2015-07-06
  Administered 2015-07-06: 15:00:00 via INTRAVENOUS

## 2015-07-06 MED ORDER — FENTANYL CITRATE (PF) 50 MCG/ML IJ SOLN
50 mcg/mL | INTRAMUSCULAR | Status: DC | PRN
Start: 2015-07-06 — End: 2015-07-06

## 2015-07-06 MED ORDER — FENTANYL CITRATE (PF) 50 MCG/ML IJ SOLN
50 mcg/mL | INTRAMUSCULAR | Status: AC
Start: 2015-07-06 — End: 2015-07-06
  Administered 2015-07-06: 15:00:00 via INTRAVENOUS

## 2015-07-06 MED ORDER — LISINOPRIL 5 MG TAB
5 mg | Freq: Every day | ORAL | Status: DC
Start: 2015-07-06 — End: 2015-07-07
  Administered 2015-07-06 – 2015-07-07 (×2): via ORAL

## 2015-07-06 MED FILL — INSULIN GLARGINE 100 UNIT/ML INJECTION: 100 unit/mL | SUBCUTANEOUS | Qty: 1

## 2015-07-06 MED FILL — OXYCODONE-ACETAMINOPHEN 5 MG-325 MG TAB: 5-325 mg | ORAL | Qty: 1

## 2015-07-06 MED FILL — METRONIDAZOLE 250 MG TAB: 250 mg | ORAL | Qty: 2

## 2015-07-06 MED FILL — HEPARIN (PORCINE) 5,000 UNIT/ML IJ SOLN: 5000 unit/mL | INTRAMUSCULAR | Qty: 1

## 2015-07-06 MED FILL — MIDAZOLAM 1 MG/ML IJ SOLN: 1 mg/mL | INTRAMUSCULAR | Qty: 2

## 2015-07-06 MED FILL — MORPHINE 2 MG/ML INJECTION: 2 mg/mL | INTRAMUSCULAR | Qty: 1

## 2015-07-06 MED FILL — CIPROFLOXACIN 250 MG TAB: 250 mg | ORAL | Qty: 2

## 2015-07-06 MED FILL — FLUMAZENIL 0.1 MG/ML IV SOLN: 0.1 mg/mL | INTRAVENOUS | Qty: 10

## 2015-07-06 MED FILL — NALOXONE 0.4 MG/ML INJECTION: 0.4 mg/mL | INTRAMUSCULAR | Qty: 1

## 2015-07-06 MED FILL — FENTANYL CITRATE (PF) 50 MCG/ML IJ SOLN: 50 mcg/mL | INTRAMUSCULAR | Qty: 2

## 2015-07-06 MED FILL — CLONIDINE 0.2 MG TAB: 0.2 mg | ORAL | Qty: 1

## 2015-07-06 MED FILL — BD POSIFLUSH NORMAL SALINE 0.9 % INJECTION SYRINGE: INTRAMUSCULAR | Qty: 20

## 2015-07-06 MED FILL — SEROQUEL XR 50 MG TABLET,EXTENDED RELEASE: 50 mg | ORAL | Qty: 3

## 2015-07-06 MED FILL — LISINOPRIL 5 MG TAB: 5 mg | ORAL | Qty: 1

## 2015-07-06 MED FILL — METOPROLOL TARTRATE 25 MG TAB: 25 mg | ORAL | Qty: 1

## 2015-07-06 MED FILL — ZOLPIDEM 5 MG TAB: 5 mg | ORAL | Qty: 1

## 2015-07-06 NOTE — Progress Notes (Signed)
Problem: Falls - Risk of  Goal: *Absence of falls  Outcome: Progressing Towards Goal  Fall precautions maintained. Pt free from any falls this shift. Will continue to monitor. Call bell within reach.

## 2015-07-06 NOTE — Progress Notes (Signed)
PHYSICAL THERAPY         Patient: [name] [name] (65 y.o. female)  Room: CD12/CD12    Date: 07/06/2015  Time:  :1230  Primary Diagnosis: Acute hyperglycemia  New onset type 1 diabetes mellitus, uncontrolled (HCC)  Acute colitis    Orders reviewed, chart reviewed.        Subjective:         Objective/Assessment:     Patient was not seen for skilled physical therapy treatment at this time, secondary to pt s/p CT guided L LL lung mass biopsy. Currently placed on bed rest till 1/25 two days per MD Allena Katz..    Plan:     Will follow up as schedule permits.      Dannielle Huh, PT  Pager [phone]

## 2015-07-06 NOTE — Progress Notes (Signed)
Hospitalist Progress Note    Patient: [name] [name]               Sex: female          DOA: 07/02/2015       Date of Birth:  08/18/1950      Age:  65 y.o.        LOS:  LOS: 4 days     PCP: Littie Deeds, MD   Assessment and Plan:   Uncontrolled DM new onset with Hyperglycemia  - Lantus and correctional dose insulin  -better control  DM teaching.  HbA1c 8.4  Would plan discharge on Lantus and oral hypoglycemics.  Added Lisinopril    Acute respiratory distress with possible asthma exacerbation-moderate persistent asthma.  -improved.  -continue Neb therapy.  -Left lower lobe nodule? Suspicious for malignancy, CT guided Biopsy done today,  Pleuritic pain at the site., pain meds PRN.  ????  New thyroid lesion  -elevated T4, normal TSH.  - Thyroid US showed multiple Cysts  -D/W radiology, will plan thyroid Bx as outpt.    Acute Left sided Colitis  -Oral Abx to complete course of 10 days.  -GI evaluation done.  -WBC improved, no fever  ????  HTN  -uncontrolled   -Continue on Clonidine  Hydralazine PRN  -resumed home dose Lisinopril  -lower dose Metoprolol    Anxiety/ depression  -continue Seroquel  ??  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  X ray right hip- no acute pathology.  PT evaluation  -pain control  -patient has outpt Surgery evaluation with ortho and spine surgery.  ??  ????  DVT ppx  -on heparin subcut, on hold for Biopsy    D/W RN     Possible discharge in 1-2 days.    Problem List:  Active Problems:    Acute colitis (07/02/2015)      New onset type 1 diabetes mellitus, uncontrolled (HCC) (07/02/2015)      Acute hyperglycemia (07/02/2015)              Subjective:  Feels pain at the procedure site.     [name] [name] is a 65 y.o. year old female who is being seen for Dm, lung mass,.    Objective: Comfortable in bed.      Vital Signs:  Visit Vitals   ??? BP 127/89   ??? Pulse 83   ??? Temp 98 ??F (36.7 ??C)   ??? Resp 18   ??? Ht  (1.702 m)    ??? Wt 81.6 kg (180 lb)   ??? SpO2 100%   ??? BMI 28.19 kg/m2     Physical Exam:  General appearance: alert, cooperative, no distress  Head: Normocephalic,atraumatic  Eyes:pale, anicteric.   Lungs: no rales, no rhonchi on auscultation.  Heart: regular rate and rhythm, S1, S2 normal, no murmur  Abdomen: soft, non-tender. Bowel sounds normal  Extremities:no cyanosis or edema  Skin: Skin color, texture, turgor normal.  Neurologic: CN Grossly normal, Extremity strength bilaterally 5/5    Lab Results:  Recent Results (from the past 24 hour(s))   GLUCOSE, POC    Collection Time: 07/05/15 12:53 PM   Result Value Ref Range    Glucose (POC) 124 (H) 65 - 105 mg/dL   GLUCOSE, POC    Collection Time: 07/05/15  5:04 PM   Result Value Ref Range    Glucose (POC) 204 (H) 65 - 105 mg/dL  GLUCOSE, POC    Collection Time: 07/05/15  9:47 PM   Result Value Ref Range    Glucose (POC) 190 (H) 65 - 105 mg/dL   CBC WITH AUTOMATED DIFF    Collection Time: 07/06/15  2:28 AM   Result Value Ref Range    WBC 7.1 4.0 - 11.0 1000/mm3    RBC 4.13 3.60 - 5.20 M/uL    HGB 11.9 (L) 13.0 - 17.2 gm/dl    HCT 16.1 (L) 09.6 - 50.0 %    MCV 86.0 80.0 - 98.0 fL    MCH 28.8 25.4 - 34.6 pg    MCHC 33.5 30.0 - 36.0 gm/dl    PLATELET 045 409 - 811 1000/mm3    MPV 11.8 (H) 6.0 - 10.0 fL    RDW-SD 42.4 36.4 - 46.3      NRBC 0 0 - 0      IMMATURE GRANULOCYTES 0.3 0.0 - 3.0 %    NEUTROPHILS 57.4 34 - 64 %    LYMPHOCYTES 32.3 28 - 48 %    MONOCYTES 6.9 1 - 13 %    EOSINOPHILS 2.7 0 - 5 %    BASOPHILS 0.4 0 - 3 %   METABOLIC PANEL, BASIC    Collection Time: 07/06/15  2:28 AM   Result Value Ref Range    Sodium 137 136 - 145 mEq/L    Potassium 3.5 3.5 - 5.1 mEq/L    Chloride 101 98 - 107 mEq/L    CO2 28 21 - 32 mEq/L    Glucose 209 (H) 74 - 106 mg/dl    BUN 12 7 - 25 mg/dl    Creatinine 0.8 0.6 - 1.3 mg/dl    GFR est AA >91.4      GFR est non-AA >60      Calcium 8.6 8.5 - 10.1 mg/dl   PROTHROMBIN TIME + INR    Collection Time: 07/06/15  2:28 AM   Result Value Ref Range     Prothrombin time 14.3 (H) 11.5 - 14.0 seconds    INR 1.1 0.1 - 1.1         Images:  Xr Hip Rt W Or Wo Pelv 2-3 Vws    Result Date: 07/05/2015  Indication: Right hip pain for 5 months Frontal view pelvis and 2 lateral views of the right hip show bipolar prosthesis in expected position on the right. No fracture or dislocation. Advanced joint space narrowing present left hip with normal femoral head contour. Metallic staple left pelvis.     IMPRESSION: No acute abnormality. 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     [place] Head Neck Soft Tissue    Result Date: 07/05/2015  Indication: Thyroid lesion Sonographic evaluation of the thyroid shows normal size right lobe at 4.2 x 1.4 x 2.6 cm. Mid pole 2.6 cm greatest diameter cyst present with nodule in upper pole measuring 6 mm. Left lobe measures 4.1 x 1.3 x 1.8 cm containing an 8 mm cyst and a more solid 4 mm nodule at the upper pole.     IMPRESSION: Multiple thyroid cysts and subcentimeter nodules. Follow-up ultrasound recommended within 6 months to document stability.        Medications:  Current Facility-Administered Medications   Medication Dose Route Frequency   ??? insulin lispro (HUMALOG) injection 8 Units  8 Units SubCUTAneous TID WITH MEALS   ??? albuterol-ipratropium (DUO-NEB) 2.5 MG-0.5 MG/3 ML  3 mL Nebulization Q4H PRN   ??? ciprofloxacin HCl (CIPRO)  tablet 500 mg  500 mg Oral Q12H   ??? insulin glargine (LANTUS) injection 25 Units  25 Units SubCUTAneous DAILY   ??? metroNIDAZOLE (FLAGYL) tablet 500 mg  500 mg Oral TID   ??? LORazepam (ATIVAN) injection 0.5 mg  0.5 mg IntraVENous Q4H PRN   ??? budesonide-formoterol (SYMBICORT) 160-4.5 mcg/actuation HFA inhaler 2 Puff  2 Puff Inhalation BID   ??? acetaminophen (TYLENOL) tablet 650 mg  650 mg Oral Q4H PRN   ??? cloNIDine HCl (CATAPRES) tablet 0.3 mg  0.3 mg Oral BID   ??? oxyCODONE-acetaminophen (PERCOCET) 5-325 mg per tablet 1 Tab  1 Tab Oral Q4H PRN   ??? QUEtiapine SR (SEROquel XR) tablet 150 mg  150 mg Oral QHS    ??? zolpidem (AMBIEN) tablet 5 mg  5 mg Oral QHS PRN   ??? sodium chloride (NS) flush 5-10 mL  5-10 mL IntraVENous Q8H   ??? sodium chloride (NS) flush 5-10 mL  5-10 mL IntraVENous PRN   ??? naloxone (NARCAN) injection 0.1 mg  0.1 mg IntraVENous PRN   ??? morphine injection 2 mg  2 mg IntraVENous Q4H PRN   ??? albuterol CONCENTRATE 2.5mg /0.5 mL neb soln  2.5 mg Nebulization Q4H PRN   ??? glucagon (GLUCAGEN) injection 1 mg  1 mg IntraMUSCular PRN   ??? dextrose (D50W) injection syrg 10-15 g  20-30 mL IntraVENous PRN   ??? insulin lispro (HUMALOG) injection   SubCUTAneous AC&HS      ??  Madaline Savage, MD  July 06, 2015  8:21 AM

## 2015-07-06 NOTE — Progress Notes (Signed)
Vascular & Interventional Radiology Brief Procedure Note    Interventional Radiologist: Daphane Shepherd, MD    Pre-operative Diagnosis:  Left lower lobe lung mass biopsy    Post-operative Diagnosis: Same as pre-op dx    Procedure(s) Performed:    CT guided left lung mass biopsy    Assistant:  none    Anesthesia:  Local and Moderate Sedation    Findings:    6 x 18G core biopsies  Technically difficult due to motion with respiration and under rib    Complications: None    Estimated Blood Loss:  minimal    Tubes and Drains: None    Specimens: None    Condition: Good    Plan:   Monitor vitals  CXR      Daphane Shepherd, MD  July 06, 2015  10:53 AM

## 2015-07-06 NOTE — Other (Signed)
Met with patient and her husband regarding insulin usage in hospital as patient has been asking the staff if they think she will go home on insulin.  Discussed the possibility but we will know more as we approach discharge.  Pt very anxious about having to take insulin.  Her husband asked, "can't she just start on Metformin?"  I assume he takes this medication.    A1c of 8.4% in relation to eAG of 194 explained.  Pt extremely tired saying "the biopsy earlier today has wiped me out."  I will keep in touch with her throughout her stay.

## 2015-07-06 NOTE — Other (Signed)
Bedside and Verbal shift change report given to Mackena Plummer RN (oncoming nurse) by Barbara RN (offgoing nurse). Report included the following information SBAR, Kardex, Procedure Summary, Intake/Output, MAR, Recent Results and Alarm Parameters .

## 2015-07-06 NOTE — Progress Notes (Signed)
Problem: Pain  Goal: *Control of Pain  Outcome: Progressing Towards Goal  Educated on pain control and use of pain meds.    Problem: Falls - Risk of  Goal: *Absence of falls  Outcome: Progressing Towards Goal  Fall preventions in place.

## 2015-07-06 NOTE — Other (Signed)
Bedside and Verbal shift change report given to Dorthy Cooler (oncoming nurse) by Lavella Lemons (offgoing nurse). Report included the following information SBAR and Kardex.

## 2015-07-06 NOTE — Progress Notes (Signed)
TRANSFER - OUT REPORT:    Verbal report given to EMily(name) on [name] [name]  being transferred to CDU 12(unit) for routine post - op       Report consisted of patient???s Situation, Background, Assessment and   Recommendations(SBAR).     Information from the following report(s) SBAR and Kardex was reviewed with the receiving nurse.    Lines:   Peripheral IV 07/03/15 Left Antecubital (Active)   Site Assessment Clean, dry, & intact 07/06/2015  8:32 AM   Phlebitis Assessment 0 07/06/2015  8:32 AM   Infiltration Assessment 0 07/06/2015  8:32 AM   Dressing Status Clean, dry, & intact 07/06/2015  8:32 AM   Dressing Type Transparent 07/06/2015  8:32 AM   Hub Color/Line Status Pink;Patent;Flushed;Capped 07/05/2015  8:14 PM   Action Taken Open ports on tubing capped 07/05/2015  7:40 AM   Alcohol Cap Used Yes 07/06/2015  8:32 AM        Opportunity for questions and clarification was provided.      Patient transported with:   [employer]

## 2015-07-06 NOTE — Progress Notes (Addendum)
[facility]        Progress Note     Name: [name] [name]   DOB: 1950-12-16   MRN: [id number]   Date: 07/06/2015    I have reviewed the flowsheet and previous day???s notes. Events, vitals, medications and notes from last 24 hours reviewed.            [facility]    I have reviewed [facility]'s  chart. I saw and examined the patient, personally performed the critical or key portions of the service and discussed the management with the treatment team. I reviewed the NP's note and concur with the history, findings and plan of care, which include's my edits and my assessment and plan. Laboratory, hemodynamic and radiological data were reviewed.         Nanetta Batty, MD      ASSESSMENT/PLAN:   -1.3cm LLL nodule and  with 2.5cm right thyroid nodule on CT chest: LLL is suspicious for malignancy. S/P  CT biopsy of the LLL nodule today. CXR negative for Pneumothorax. Cytology pending. She will need PET scan as outpatient.   ??  -Mild to moderate persistent Asthma (not exacerbation):  Continue Albuterol/Atrovent nebs every 6 hr and PRN. Continue Symbicort.    -Prominent emphysematous changes with scattered blebs and bullae throughout all 5 lobes. Largest bullae within the left upper lobe roughly measures 8.8 x 6.7 cm. No history of significant smoking. A1AT level pending. Will need PFTS as outpatient.   ??  -Acute Colitis: Continue Cipro and Flagyl per IM.  ??  -DM type 2: Management per IM.   ??  -HTN: per IM  ??  -Anxiety/Depression: per IM  ??  -Heparin SQ for DVT prophylaxis    Updated patient at bedside on plan of care and addressed all questions.  Discussed with Dr. Estil Daft. Can be discharged in 1-2 days with outpatient follow up with Dr. Grayland Jack.   Will continue to follow.                SUBJECTIVE:  Patient resting in bed after CT guided biopsy. Reports pain on left side and frustration. Do not expect procedure to be in painful. No family at  bedside. Denies any shortness of breath, nausea, vomiting, diarrhea.    ROS:   Otherwise negative except for as mentioned in subjective.     Allergy:  Allergies   Allergen Reactions   ??? Chocolate [Cocoa] Sneezing        Vital Signs:    Visit Vitals   ??? BP (!) 148/100 (BP 1 Location: Right arm, BP Patient Position: Sitting)   ??? Pulse 90   ??? Temp 99.5 ??F (37.5 ??C)   ??? Resp 18   ??? Ht  (1.702 m)   ??? Wt 81.6 kg (180 lb)   ??? SpO2 100%   ??? BMI 28.19 kg/m2       O2 Device: Nasal cannula   O2 Flow Rate (L/min): 2 l/min   Temp (24hrs), Avg:98 ??F (36.7 ??C), Min:97.3 ??F (36.3 ??C), Max:99.5 ??F (37.5 ??C)       Patient Vitals for the past 8 hrs:   Temp Pulse Resp BP SpO2   07/06/15 1135 - - - (!) 148/100 -   07/06/15 1132 99.5 ??F (37.5 ??C) 90 18 (!) 155/104 100 %   07/06/15 1045 - 87 10 (!) 160/121 98 %   07/06/15 1040 - 83 15 (!) 166/121  100 %   07/06/15 1035 - 84 12 (!) 171/93 100 %   07/06/15 1030 - 81 15 (!) 168/107 100 %   07/06/15 1025 - 83 15 (!) 160/126 99 %   07/06/15 1015 - 82 15 (!) 169/113 98 %   07/06/15 1009 - 81 12 (!) 157/105 97 %   07/06/15 0829 97.8 ??F (36.6 ??C) 83 18 (!) 138/99 98 %   07/06/15 0621 - - - 127/89 -         Intake/Output:     Last shift:         Last 3 shifts: 01/21 1901 - 01/23 0700  In: 1020 [P.O.:1020]  Out: -     Intake/Output Summary (Last 24 hours) at 07/06/15 1335  Last data filed at 07/05/15 1803   Gross per 24 hour   Intake              360 ml   Output                0 ml   Net              360 ml          Physical Exam:  HEENT: NC/AT, PERRLA, Oral Mucosa pink moist    Neck: Supple, trachea midline, voice clear   CV: S1S2 noted, No R/G.   Resp: Symmetrical chest expansion, Bilateral breath sounds. No wheeze or rales. On RA  Abd: Soft NT with active BS  Ext: no edema, + pulses   NEURO: A/Ox3, Follows commands. NAD      DATA:     Current Facility-Administered Medications   Medication Dose Route Frequency   ??? lisinopril (PRINIVIL, ZESTRIL) tablet 5 mg  5 mg Oral DAILY    ??? metoprolol tartrate (LOPRESSOR) tablet 12.5 mg  12.5 mg Oral Q12H   ??? insulin lispro (HUMALOG) injection 8 Units  8 Units SubCUTAneous TID WITH MEALS   ??? albuterol-ipratropium (DUO-NEB) 2.5 MG-0.5 MG/3 ML  3 mL Nebulization Q4H PRN   ??? ciprofloxacin HCl (CIPRO) tablet 500 mg  500 mg Oral Q12H   ??? insulin glargine (LANTUS) injection 25 Units  25 Units SubCUTAneous DAILY   ??? metroNIDAZOLE (FLAGYL) tablet 500 mg  500 mg Oral TID   ??? LORazepam (ATIVAN) injection 0.5 mg  0.5 mg IntraVENous Q4H PRN   ??? budesonide-formoterol (SYMBICORT) 160-4.5 mcg/actuation HFA inhaler 2 Puff  2 Puff Inhalation BID   ??? acetaminophen (TYLENOL) tablet 650 mg  650 mg Oral Q4H PRN   ??? cloNIDine HCl (CATAPRES) tablet 0.3 mg  0.3 mg Oral BID   ??? oxyCODONE-acetaminophen (PERCOCET) 5-325 mg per tablet 1 Tab  1 Tab Oral Q4H PRN   ??? QUEtiapine SR (SEROquel XR) tablet 150 mg  150 mg Oral QHS   ??? zolpidem (AMBIEN) tablet 5 mg  5 mg Oral QHS PRN   ??? sodium chloride (NS) flush 5-10 mL  5-10 mL IntraVENous Q8H   ??? sodium chloride (NS) flush 5-10 mL  5-10 mL IntraVENous PRN   ??? naloxone (NARCAN) injection 0.1 mg  0.1 mg IntraVENous PRN   ??? morphine injection 2 mg  2 mg IntraVENous Q4H PRN   ??? albuterol CONCENTRATE 2.5mg /0.5 mL neb soln  2.5 mg Nebulization Q4H PRN   ??? glucagon (GLUCAGEN) injection 1 mg  1 mg IntraMUSCular PRN   ??? dextrose (D50W) injection syrg 10-15 g  20-30 mL IntraVENous PRN   ??? insulin lispro (HUMALOG) injection   SubCUTAneous AC&HS  Labs:  Reviewed.   Recent Results (from the past 24 hour(s))   GLUCOSE, POC    Collection Time: 07/05/15  5:04 PM   Result Value Ref Range    Glucose (POC) 204 (H) 65 - 105 mg/dL   GLUCOSE, POC    Collection Time: 07/05/15  9:47 PM   Result Value Ref Range    Glucose (POC) 190 (H) 65 - 105 mg/dL   CBC WITH AUTOMATED DIFF    Collection Time: 07/06/15  2:28 AM   Result Value Ref Range    WBC 7.1 4.0 - 11.0 1000/mm3    RBC 4.13 3.60 - 5.20 M/uL    HGB 11.9 (L) 13.0 - 17.2 gm/dl     HCT 16.1 (L) 09.6 - 50.0 %    MCV 86.0 80.0 - 98.0 fL    MCH 28.8 25.4 - 34.6 pg    MCHC 33.5 30.0 - 36.0 gm/dl    PLATELET 045 409 - 811 1000/mm3    MPV 11.8 (H) 6.0 - 10.0 fL    RDW-SD 42.4 36.4 - 46.3      NRBC 0 0 - 0      IMMATURE GRANULOCYTES 0.3 0.0 - 3.0 %    NEUTROPHILS 57.4 34 - 64 %    LYMPHOCYTES 32.3 28 - 48 %    MONOCYTES 6.9 1 - 13 %    EOSINOPHILS 2.7 0 - 5 %    BASOPHILS 0.4 0 - 3 %   METABOLIC PANEL, BASIC    Collection Time: 07/06/15  2:28 AM   Result Value Ref Range    Sodium 137 136 - 145 mEq/L    Potassium 3.5 3.5 - 5.1 mEq/L    Chloride 101 98 - 107 mEq/L    CO2 28 21 - 32 mEq/L    Glucose 209 (H) 74 - 106 mg/dl    BUN 12 7 - 25 mg/dl    Creatinine 0.8 0.6 - 1.3 mg/dl    GFR est AA >91.4      GFR est non-AA >60      Calcium 8.6 8.5 - 10.1 mg/dl   PROTHROMBIN TIME + INR    Collection Time: 07/06/15  2:28 AM   Result Value Ref Range    Prothrombin time 14.3 (H) 11.5 - 14.0 seconds    INR 1.1 0.1 - 1.1     GLUCOSE, POC    Collection Time: 07/06/15  8:30 AM   Result Value Ref Range    Glucose (POC) 185 (H) 65 - 105 mg/dL   GLUCOSE, POC    Collection Time: 07/06/15 11:33 AM   Result Value Ref Range    Glucose (POC) 160 (H) 65 - 105 mg/dL         Imaging:  CT scan and CXR personally reviewed.         Okey Dupre, NP

## 2015-07-06 NOTE — Other (Signed)
Bedside and Verbal shift change report given to Lavella Lemons (oncoming nurse) by Rush Barer (offgoing nurse). Report included the following information SBAR, Kardex and Recent Results. \\

## 2015-07-07 LAB — ALPHA-1-ANTITRYPSIN, TOTAL
AlpHa-1 Antitrypsin: 110 mg/dL (ref 83–199)
Alpha-1 Antitrypsin: 110 mg/dL (ref 83–199)

## 2015-07-07 LAB — GLUCOSE, POC
Glucose (POC): 97 mg/dL (ref 65–105)
Glucose (POC): 151 mg/dL — ABNORMAL HIGH (ref 65–105)
Glucose (POC): 259 mg/dL — ABNORMAL HIGH (ref 65–105)

## 2015-07-07 MED ORDER — METRONIDAZOLE 500 MG TAB
500 mg | ORAL_TABLET | Freq: Three times a day (TID) | ORAL | 0 refills | Status: AC
Start: 2015-07-07 — End: 2015-07-12

## 2015-07-07 MED ORDER — CIPROFLOXACIN 500 MG TAB
500 mg | ORAL_TABLET | Freq: Two times a day (BID) | ORAL | 0 refills | Status: AC
Start: 2015-07-07 — End: 2015-07-12

## 2015-07-07 MED ORDER — METOPROLOL TARTRATE 25 MG TAB
25 mg | ORAL_TABLET | Freq: Two times a day (BID) | ORAL | 0 refills | Status: DC
Start: 2015-07-07 — End: 2015-07-23

## 2015-07-07 MED ORDER — METFORMIN 500 MG TAB
500 mg | Freq: Two times a day (BID) | ORAL | Status: DC
Start: 2015-07-07 — End: 2015-07-07
  Administered 2015-07-07: 16:00:00 via ORAL

## 2015-07-07 MED ORDER — METFORMIN 500 MG TAB
500 mg | ORAL_TABLET | Freq: Two times a day (BID) | ORAL | 0 refills | Status: AC
Start: 2015-07-07 — End: 2015-08-06

## 2015-07-07 MED ORDER — METFORMIN 500 MG TAB
500 mg | Freq: Two times a day (BID) | ORAL | Status: DC
Start: 2015-07-07 — End: 2015-07-07

## 2015-07-07 MED ORDER — BUDESONIDE-FORMOTEROL HFA 160 MCG-4.5 MCG/ACTUATION AEROSOL INHALER
Freq: Two times a day (BID) | RESPIRATORY_TRACT | 0 refills | Status: DC
Start: 2015-07-07 — End: 2015-07-08

## 2015-07-07 MED FILL — MORPHINE 2 MG/ML INJECTION: 2 mg/mL | INTRAMUSCULAR | Qty: 1

## 2015-07-07 MED FILL — METRONIDAZOLE 250 MG TAB: 250 mg | ORAL | Qty: 2

## 2015-07-07 MED FILL — CLONIDINE 0.2 MG TAB: 0.2 mg | ORAL | Qty: 1

## 2015-07-07 MED FILL — METFORMIN 500 MG TAB: 500 mg | ORAL | Qty: 2

## 2015-07-07 MED FILL — INSULIN GLARGINE 100 UNIT/ML INJECTION: 100 unit/mL | SUBCUTANEOUS | Qty: 1

## 2015-07-07 MED FILL — SEROQUEL XR 50 MG TABLET,EXTENDED RELEASE: 50 mg | ORAL | Qty: 3

## 2015-07-07 MED FILL — BD POSIFLUSH NORMAL SALINE 0.9 % INJECTION SYRINGE: INTRAMUSCULAR | Qty: 40

## 2015-07-07 MED FILL — METOPROLOL TARTRATE 25 MG TAB: 25 mg | ORAL | Qty: 1

## 2015-07-07 MED FILL — CIPROFLOXACIN 250 MG TAB: 250 mg | ORAL | Qty: 2

## 2015-07-07 MED FILL — SYMBICORT 160 MCG-4.5 MCG/ACTUATION HFA AEROSOL INHALER: RESPIRATORY_TRACT | Qty: 6

## 2015-07-07 MED FILL — ZOLPIDEM 5 MG TAB: 5 mg | ORAL | Qty: 1

## 2015-07-07 MED FILL — OXYCODONE-ACETAMINOPHEN 5 MG-325 MG TAB: 5-325 mg | ORAL | Qty: 1

## 2015-07-07 MED FILL — LISINOPRIL 5 MG TAB: 5 mg | ORAL | Qty: 1

## 2015-07-07 NOTE — Discharge Summary (Signed)
Discharge Summary    Patient: [name] [name]               Sex: female          DOA: 07/02/2015         Date of Birth:  Oct 13, 1950      Age:  65 y.o.        LOS:  LOS: 5 days                PCP: Littie Deeds, MD    Admit Date: 07/02/2015    Discharge Date: 07/07/2015    Reason for admission: Acute hyperglycemia  New onset type 1 diabetes mellitus, uncontrolled (HCC)  Acute colitis    Initial Presentation:  CHIEF COMPLAINT:  The is a 65 year old female who is coming to the hospital having vomiting and   diarrhea for the past 2 to 3 days. She has not had much intake since that   time, went to the primary care physician a few days ago, and then for the same  wanted to go back to the PCP, called the office, who asked her to come to the  emergency room as she has not gotten any better. She had been having   abdominal pain which is diffuse. She also says that she is not able to keep   anything down, feels extremely dehydrated. She has had a recent hip surgery   in the last 6 months in [place] on the right side. Has not been on any   antibiotics recently. She says the pain in the abdomen is 8 out of 10, left   lower quadrant, but goes over the right side also. No chest pain, no   palpitations, no loss of weight.  Discharge Diagnoses:   Hospital Problems  Date Reviewed: 07-08-2015          Codes Class Noted POA    Acute colitis ICD-10-CM: K52.9  ICD-9-CM: 558.9  07/02/2015 Unknown        New onset type 1 diabetes mellitus, uncontrolled (HCC) ICD-10-CM: E10.65  ICD-9-CM: 250.03  07/02/2015 Unknown        Acute hyperglycemia ICD-10-CM: R73.9  ICD-9-CM: 790.29  07/02/2015 Unknown            Hospital Course:   Major issues addressed during hospitalization outlined  below.     Acute left-sided colitis  Evaluated by gastroenterology  Symptoms improved clinically and patient is tolerating diet well  Getting discharged on oral antibiotics ciprofloxacin and Flagyl to complete the antibiotic course of 10 days.   Patient presented with abdominal pain and diarrhea.    Acute respiratory distress with asthma exacerbation/ moderate persistent asthma   Treated with nebulization therapy and symptoms improved. Oxygen saturations better in room air minimal wheezing on discharge   Pulmonology evaluated. Patient would continue on Symbicort, albuterol nebulizer treatment.  For pulmonary function test  As outpatient  RAST/IGE level pending.     Diabetes mellitus type 2 new onset with hypoglycemia  Patient was treated with insulin therapy with basal Lantus and correction of lispro insulin  Patient is better reluctant to use insulin upon discharge and refused insulin treatment  Patient is getting discharged on metformin therapy  Diabetic education provided  Patient was instructed for home blood glucose monitoring.    Incidental left lower lobe mass/nodule 1.3 cm with 2.5 cm right thyroid nodule on CT chest   Underwent CT-guided biopsies with intervention radiology  Pulmonology evaluation done  Biopsy results pending,  patient will follow-up with Dr. Helene Shoe pulmonology on 07/10/2015 at 12:30 PM    Incidental thyroid hypodense lesion on CT chest  Thyroid ultrasound showed multiple small cysts  -Discussed with interventional radiology, patient would need outpatient thyroid biopsy for further evaluation, to be done through her primary care.  -Patient has mild elevated free T4 but normal TSH.    Acute kidney injury with prerenal cause/dehydration  Resolved with gentle fluid hydration.  Advised to avoid nephrotoxic medication and stopped and NSAIDS      Discharge Medications:     Current Discharge Medication List      START taking these medications    Details   ciprofloxacin HCl (CIPRO) 500 mg tablet Take 1 Tab by mouth every twelve (12) hours for 5 days.  Qty: 10 Tab, Refills: 0      metFORMIN (GLUCOPHAGE) 500 mg tablet Take 2 Tabs by mouth two (2) times daily (with meals) for 30 days. Indications: type 2 diabetes mellitus   Qty: 120 Tab, Refills: 0      metroNIDAZOLE (FLAGYL) 500 mg tablet Take 1 Tab by mouth three (3) times daily for 5 days.  Qty: 15 Tab, Refills: 0      budesonide-formoterol (SYMBICORT) 160-4.5 mcg/actuation HFA inhaler Take 2 Puffs by inhalation two (2) times a day.  Qty: 1 Inhaler, Refills: 0         CONTINUE these medications which have CHANGED    Details   metoprolol tartrate (LOPRESSOR) 25 mg tablet Take 1 Tab by mouth every twelve (12) hours for 30 days.  Qty: 60 Tab, Refills: 0         CONTINUE these medications which have NOT CHANGED    Details   lisinopril (PRINIVIL, ZESTRIL) 5 mg tablet TAKE ONE TABLET BY MOUTH ONCE DAILY  Qty: 30 Tab, Refills: 2    Associated Diagnoses: Essential hypertension      zolpidem (AMBIEN) 5 mg tablet Take 1 Tab by mouth nightly as needed for Sleep. Max Daily Amount: 5 mg.  Qty: 15 Tab, Refills: 0    Associated Diagnoses: Primary insomnia      albuterol (PROVENTIL HFA, VENTOLIN HFA, PROAIR HFA) 90 mcg/actuation inhaler Take 2 Puffs by inhalation every six (6) hours as needed for Wheezing or Shortness of Breath.  Qty: 1 Inhaler, Refills: 0    Associated Diagnoses: Acute bronchitis, unspecified organism      oxyCODONE-acetaminophen (PERCOCET) 5-325 mg per tablet Take 1 Tab by mouth every twelve (12) hours as needed for Pain. Max Daily Amount: 2 Tabs.  Qty: 40 Tab, Refills: 0    Associated Diagnoses: Status post right hip replacement; Primary osteoarthritis of both hips; Acute right-sided low back pain, with sciatica presence unspecified      SEROQUEL XR 150 mg sr tablet TAKE 1 TABLET BY MOUTH NIGHTLY  Qty: 30 Tab, Refills: 2    Associated Diagnoses: PTSD (post-traumatic stress disorder)      amLODIPine (NORVASC) 10 mg tablet Take 1 Tab by mouth daily.  Qty: 30 Tab, Refills: 1    Associated Diagnoses: Essential hypertension with goal blood pressure less than 140/90      cloNIDine HCl (CATAPRES) 0.3 mg tablet Take 1 Tab by mouth two (2) times a day.  Qty: 60 Tab, Refills: 1          STOP taking these medications       ondansetron (ZOFRAN ODT) 8 mg disintegrating tablet Comments:   Reason for Stopping:  guaiFENesin-codeine (ROBITUSSIN AC) 100-10 mg/5 mL solution Comments:   Reason for Stopping:         diclofenac EC (VOLTAREN) 75 mg EC tablet Comments:   Reason for Stopping:         chlorthalidone (HYGROTEN) 25 mg tablet Comments:   Reason for Stopping:         acetaminophen (TYLENOL) 325 mg tablet Comments:   Reason for Stopping:               Follow-up: Dr. Helene Shoe pulmonology as scheduled, follow up primary care for thyroid lesion evaluation    Discharge Condition: fair    Activity: tolerated    Diet: diabetic diet    CODE  fullcode    Visit Vitals   ??? BP 127/90 (BP Patient Position: Sitting)   ??? Pulse 71   ??? Temp 97.6 ??F (36.4 ??C)   ??? Resp 18   ??? Ht 5\' 7"  (1.702 m)   ??? Wt 81.6 kg (180 lb)   ??? SpO2 100%   ??? BMI 28.19 kg/m2     Labs:  Labs: Results:       Chemistry Recent Labs      07/06/15   0228  07/05/15   0453   GLU  209*  157*   NA  137  140   K  3.5  3.4*   CL  101  102   CO2  28  30   BUN  12  11   CREA  0.8  0.7   CA  8.6  8.6      CBC w/Diff Recent Labs      07/06/15   0228  07/05/15   0453   WBC  7.1  6.9   RBC  4.13  4.09   HGB  11.9*  11.8*   HCT  35.5*  34.8*   PLT  181  161   GRANS  57.4  55.3   LYMPH  32.3  34.9   EOS  2.7  2.9      Cardiac Enzymes No results for input(s): CPK, CKND1, MYO in the last 72 hours.    No lab exists for component: CKRMB, TROIP   Coagulation Recent Labs      07/06/15   0228   PTP  14.3*   INR  1.1       Lipid Panel No results found for: CHOL, CHOLPOCT, CHOLX, CHLST, CHOLV, [id number], HDL, LDL, NLDLCT, DLDL, LDLC, DLDLP, [id number], VLDLC, VLDL, TGL, TGLX, TRIGL, [id number], TRIGP, TGLPOCT, [id number], CHHD, CHHDX   BNP No results for input(s): BNPP in the last 72 hours.   Liver Enzymes No results for input(s): TP, ALB, TBIL, AP, SGOT, GPT in the last 72 hours.    No lab exists for component: DBIL   Thyroid Studies Lab Results   Component Value Date/Time     TSH 0.633 07/04/2015 01:48 PM          Imaging:  Xr Chest Sngl V    Result Date: 07/03/2015  History: Acute hyperglycemia, short of breath AP chest: Compared to 07/02/2015 the heart is normal in size. Patchy airspace disease persists left mid to lower zone slightly increased with similar degree of inspiration. No effusion.     IMPRESSION: Right basilar pneumonia. PA lateral views chest recommended when the patient is able.     Xr Chest Sngl V    Result Date: 07/02/2015  AP chest. HISTORY: Shortness of breath. Mass identified in the left lung  on recent abdomen CT scan. FINDINGS: There is a well-circumscribed nodule identified in the left lower lung field on CT scan. With associated minimal infiltrative change in left base. Remaining lungs are clear. Cardiac silhouette is normal.     IMPRESSION: Well-circumscribed nodule in the left lung base with associated minimal infiltrative change.     Ct Chest Wo Cont    Result Date: 07/03/2015  CHEST CT WITHOUT CONTRAST INDICATION: Follow-up lung nodule TECHNIQUE: Contiguous axial CT images of the chest were obtained from the thoracic inlet to the upper abdomen without the use of intravenous contrast. COMPARISON: Chest radiograph dated 07/02/2015. FINDINGS: There is a 2.5 x 1.5 cm right thyroid lobe hypodensity. A few scattered mediastinal lymph nodes, which are not pathologically enlarged. No axillary or definite hilar lymphadenopathy. The heart is normal in size. No pericardial effusion. Prominent emphysematous changes with scattered blebs and bullae throughout all 5 lobes. Largest bullae within the left upper lobe roughly measures 8.8 x 6.7 cm. There is a 1.3 x 1.3 cm left lower lobe nodular density. A few scattered areas of subsegmental atelectasis and/or scarring. Left lung base atelectasis and/or scarring. Visualized abdominal structures grossly unremarkable. No aggressive osseous lesions.     IMPRESSION: 1. A 1.3 x 1.3 cm left lower lobe nodular density. Follow-up  per Fleischner criteria if tissue biopsy is not obtained. 2. A 2.5 x 1.5 cm right thyroid lobe hypodense lesion. 3. Prominent emphysematous changes with scattered blebs and bullae throughout all 5 lobes. FLEISCHNER SOCIETY RECOMMENDATIONS, INCIDENTAL PULMONARY NODULE Nodule size (millimeters) LOW RISK PATIENT (Minimal or absent history of smoking and of other known risk factors) </= 4 mm No followup needed* > 4 o 6 mm Followup CT scan at 12 months, if unchanged no further followup* > 6 to 8 mm Initial followup CT at 6 to 12 months, then at 18 - 24 months if no change. > 8 mm Followup CT scan at around 3, 9 and 24 months. Consider dynamic contrast-enhanced CT scan, PET scan, and/or biopsy. HIGH RISK PATIENT (History of smoking or other risk factors) </= 4 mm Followup CT scan 12 months, if unchanged no further followup. > 4 to 6 mm Initial followup at 6 to 12 months, then at 18 - 24 months if no change. > 6 to 8 mm Initial followup CT at 3 to 6 months then at 9 - 12 months and 24 months if no change >8 mm Followup CT scan at around 3, 9 and 24 months, dynamic contrast CT scan, PET and/or biopsy. Newly detected indeterminate nodules in persons 40 years of age or older: - average of length and width. - minimal or absence history of smoking and of other known risk factors (low risk patients) - history of smoking or of other known risk factors (high risk patients) *Risk of malignancy in this category (less than 1%) substantially less than in a baseline CT scan of an asymptomatic smoker. Non solid (ground-glass) or partly solid nodules may require longer followup to exclude indolent adenocarcinoma.     Ct Abd Pelv W Cont    Result Date: 07/02/2015  CT abdomen and pelvis with intravenous contrast. Comparison 11/18/2014. HISTORY: Vomiting and diarrhea. TECHNIQUE: Exam with nonionic intravenous contrast. FINDINGS: Benign-appearing calcification within the left breast.  Bullous changes are seen in the upper lobes. And left lower lobe. There is a well-circumscribed mass identified in the left lower lobe image 3 series 4 measuring 1.9 cm. Not present on the previous examination. Liver, gallbladder and spleen  normal. Stomach unremarkable. Normal pancreas. No adrenal masses. Small cyst within the right kidney. Tiny nonobstructing stones in the right kidney. Tiny nonobstructing stones in the left kidney. Small left renal cysts. Abdominal aorta normal in size. Normal-appearing appendix. Bowel gas pattern shows thickening of the colon from the splenic flexure down to the sigmoid colon. Most likely compatible with colitis. There are scattered diverticula identified within the sigmoid colon. Favor colitis over diverticulitis. Right hip prosthesis. Bladder outline normal. Lower facet arthropathy.     IMPRESSION: 1. Left-sided colitis. 2. Sigmoid colon diverticulosis. 3. There is a mass identified within the left lower lobe measuring 1.9 cm not present on previous study. Dedicated chest CT recommended for further evaluation. 4. Bullous disease within the lungs. 5. Bilateral nonobstructing renal stones. 6. Benign-appearing calcification within the left breast. 7. Right hip prosthesis     Consults:  gastroenterology pulmonology    Treatment Team: Treatment Team: Attending Provider: Madaline Savage, MD; Nurse Practitioner: Olin Pia, NP; Consulting Provider: Madaline Savage, MD; Consulting Provider: Zoe Lan, MD; Care Manager: Inis Sizer, RN; Occupational Therapist: Jesse Fall, OT      Madaline Savage, MD  July 07, 2015  2:29 PM  >50% of 45  minutes spent in counseling and co-ordination of care.

## 2015-07-07 NOTE — Progress Notes (Addendum)
Addendum glucometer with outpatient pharmacy cancelled d/t patient will follow up with pcp and will address need there.     Discharge Plan:  Home with home health-RN only patient declines for PT/OT. CM to assist with getting patient a glucometer with strips per md patient is not going home on insulin as patient refuses.diabetes educator has seen patient during this admission.    Discharge Date:     07/07/2015     Assisted Living Facility:    no    The facility confirmed that they are ready to receive the patient:     Yes/No; the CM spoke with     no    Home Health Needed:     Yes RN    Home Health Agency:    [facility]    Confirmed start of care with the home health agency and spoke with:      email sent of discharge    DME needed and ordered for Discharge:    Yes Glucometer    DME Company:    Outpatient pharmacy    CM confirmed delivery of DME: with      no    TCC Referral:    no    Medication Assistance given     Y/N       meds given (please list)     no    Change(MDC/SSDI) Referral/outcome    no     Transportation: Family/ Medical    Spouse    Transport Time:    anytime    Family, MD, patient, nurse and facility aware of pickup time    yes

## 2015-07-07 NOTE — Progress Notes (Signed)
Discussed HH process with patient. Facesheet and PCP verified. Brochure given.

## 2015-07-07 NOTE — Progress Notes (Addendum)
[facility]        Progress Note     Name: [name] [name]   DOB: 05-07-1951   MRN: [id number]   Date: 07/07/2015    I have reviewed the flowsheet and previous day???s notes. Events, vitals, medications and notes from last 24 hours reviewed.            [facility]    I have reviewed [name] [name]'s  chart. I saw and examined the patient, personally performed the critical or key portions of the service and discussed the management with the treatment team. I reviewed the NP's note and concur with the history, findings and plan of care, which include's my edits and my assessment and plan. Laboratory, hemodynamic and radiological data were reviewed.         Nanetta Batty, MD      ASSESSMENT/PLAN:   -1.3cm LLL nodule and  with 2.5cm right thyroid nodule on CT chest: LLL is suspicious for malignancy. S/P  CT biopsy of the LLL nodule yesterday. CXR negative for Pneumothorax. Cytology pending. She will need PET scan as outpatient.   ??  -Mild to moderate persistent Asthma (not exacerbation):  Continue Albuterol/Atrovent nebs every 6 hr and PRN. Continue Symbicort.    -Prominent emphysematous changes with scattered blebs and bullae throughout all 5 lobes. Largest bullae within the left upper lobe roughly measures 8.8 x 6.7 cm. No history of significant smoking. A1AT level pending. Will need PFTS as outpatient.   ??  -Acute Colitis: Continue Cipro and Flagyl per IM.  ??  -DM type 2: Management per IM.   ??  -HTN: per IM  ??  -Anxiety/Depression: per IM  ??  -Heparin SQ for DVT prophylaxis    Updated patient at bedside on plan of care and addressed all questions.  No objection to discharge from PCCM standpoint.   Discussed with Dr. Estil Daft. Pt will f/u with Dr. Helene Shoe on 07/10/2015 at 12:30pm to review CT guided bx results and new Pt appointment. Discussed with Dr. Helene Shoe.  >50% of 40 minutes spent in counseling and co-ordination of care.                  SUBJECTIVE:  Patient reports pain better and resting in bed. Plan for discharge home today. No c/o chest pain or fever/chills.     ROS:   Otherwise negative except for as mentioned in subjective.     Allergy:  Allergies   Allergen Reactions   ??? Chocolate [Cocoa] Sneezing        Vital Signs:    Visit Vitals   ??? BP 127/90 (BP Patient Position: Sitting)   ??? Pulse 71   ??? Temp 97.6 ??F (36.4 ??C)   ??? Resp 18   ??? Ht  (1.702 m)   ??? Wt 81.6 kg (180 lb)   ??? SpO2 100%   ??? BMI 28.19 kg/m2       O2 Device: Nasal cannula   O2 Flow Rate (L/min): 2 l/min   Temp (24hrs), Avg:98.2 ??F (36.8 ??C), Min:97.6 ??F (36.4 ??C), Max:99.5 ??F (37.5 ??C)       Patient Vitals for the past 8 hrs:   Temp Pulse Resp BP SpO2   07/07/15 0859 97.6 ??F (36.4 ??C) 71 - 127/90 100 %   07/07/15 0536 98.3 ??F (36.8 ??C) 64 18 (!) 117/93 99 %         Intake/Output:  Last shift:         Last 3 shifts: 01/22 1901 - 01/24 0700  In: 480 [P.O.:480]  Out: -     Intake/Output Summary (Last 24 hours) at 07/07/15 0923  Last data filed at 07/06/15 1805   Gross per 24 hour   Intake              480 ml   Output                0 ml   Net              480 ml          Physical Exam:  HEENT: NC/AT, PERRLA, Oral Mucosa pink moist    Neck: Supple, trachea midline, voice clear   CV: S1S2 noted, No R/G.   Resp: Symmetrical chest expansion, Bilateral breath sounds. No wheeze or rales. On RA  Abd: Soft NT with active BS  Ext: no edema, + pulses   NEURO: A/Ox3, Follows commands. NAD      DATA:     Current Facility-Administered Medications   Medication Dose Route Frequency   ??? metFORMIN (GLUCOPHAGE) tablet 1,000 mg  1,000 mg Oral BID WITH MEALS   ??? lisinopril (PRINIVIL, ZESTRIL) tablet 5 mg  5 mg Oral DAILY   ??? metoprolol tartrate (LOPRESSOR) tablet 12.5 mg  12.5 mg Oral Q12H   ??? albuterol-ipratropium (DUO-NEB) 2.5 MG-0.5 MG/3 ML  3 mL Nebulization Q4H PRN   ??? ciprofloxacin HCl (CIPRO) tablet 500 mg  500 mg Oral Q12H    ??? insulin glargine (LANTUS) injection 25 Units  25 Units SubCUTAneous DAILY   ??? metroNIDAZOLE (FLAGYL) tablet 500 mg  500 mg Oral TID   ??? LORazepam (ATIVAN) injection 0.5 mg  0.5 mg IntraVENous Q4H PRN   ??? budesonide-formoterol (SYMBICORT) 160-4.5 mcg/actuation HFA inhaler 2 Puff  2 Puff Inhalation BID   ??? acetaminophen (TYLENOL) tablet 650 mg  650 mg Oral Q4H PRN   ??? cloNIDine HCl (CATAPRES) tablet 0.3 mg  0.3 mg Oral BID   ??? oxyCODONE-acetaminophen (PERCOCET) 5-325 mg per tablet 1 Tab  1 Tab Oral Q4H PRN   ??? QUEtiapine SR (SEROquel XR) tablet 150 mg  150 mg Oral QHS   ??? zolpidem (AMBIEN) tablet 5 mg  5 mg Oral QHS PRN   ??? sodium chloride (NS) flush 5-10 mL  5-10 mL IntraVENous Q8H   ??? sodium chloride (NS) flush 5-10 mL  5-10 mL IntraVENous PRN   ??? naloxone (NARCAN) injection 0.1 mg  0.1 mg IntraVENous PRN   ??? morphine injection 2 mg  2 mg IntraVENous Q4H PRN   ??? albuterol CONCENTRATE 2.5mg /0.5 mL neb soln  2.5 mg Nebulization Q4H PRN   ??? glucagon (GLUCAGEN) injection 1 mg  1 mg IntraMUSCular PRN   ??? dextrose (D50W) injection syrg 10-15 g  20-30 mL IntraVENous PRN   ??? insulin lispro (HUMALOG) injection   SubCUTAneous AC&HS             Labs:  Reviewed.   Recent Results (from the past 24 hour(s))   GLUCOSE, POC    Collection Time: 07/06/15 11:33 AM   Result Value Ref Range    Glucose (POC) 160 (H) 65 - 105 mg/dL   GLUCOSE, POC    Collection Time: 07/06/15  3:36 PM   Result Value Ref Range    Glucose (POC) 277 (H) 65 - 105 mg/dL   GLUCOSE, POC    Collection Time: 07/06/15  8:46 PM   Result Value Ref Range    Glucose (POC) 97 65 - 105 mg/dL   GLUCOSE, POC    Collection Time: 07/07/15  8:32 AM   Result Value Ref Range    Glucose (POC) 151 (H) 65 - 105 mg/dL         Imaging:  No new imaging to review.         Okey Dupre, NP

## 2015-07-07 NOTE — Other (Signed)
Met with patient regarding plan for discharge medications (Metformin).  Pt hopeful that since her husband takes Metformin only that this is all she will need to control her diabetes. Although she states diabetes is new to her she did mention that she used to be able to go to the gym and all of her "levels would come back down."  It is possible that she has been told she has high blood sugar but not actually diabetes (?).  Explained the A1c test and results again today.  Pt does not understand how she has diabetes when it does not run in her family and her father is 52 years old and in good health.    A&P of diabetes including the gradual destruction of beta cells discussed with the patient.  Impressed upon her that her diabetes is not the same as her husband's diabetes.  Information for outpatient diabetes classes given to patient.  She will need a referral from her PCP to attend.  MOA of Metformin and side effects explained.  Pt will use her husband's glucometer until she sees her PCP where she can get a Rx for the meter and supplies.  Pt states she wants to absorb all of this information and she will go look on the computer for more information.  Credible diabetes websites were suggested for her to look at.  Pt states she has no more questions at this time.

## 2015-07-07 NOTE — Progress Notes (Signed)
Problem: Falls - Risk of  Goal: *Absence of falls  Outcome: Progressing Towards Goal  Pt remained free from falls throughout shift. Bed in lowest position, call bell within reach, safety measures implemented. Pt encouraged to call for any assistance. Will continue to monitor.

## 2015-07-08 ENCOUNTER — Ambulatory Visit: Admit: 2015-07-08 | Discharge: 2015-07-08 | Payer: MEDICARE | Attending: Family Medicine | Primary: Physician Assistant

## 2015-07-08 DIAGNOSIS — R112 Nausea with vomiting, unspecified: Secondary | ICD-10-CM

## 2015-07-08 DIAGNOSIS — E119 Type 2 diabetes mellitus without complications: Secondary | ICD-10-CM

## 2015-07-08 LAB — CULTURE, BLOOD
Blood Culture Result: NO GROWTH
Blood Culture Result: NO GROWTH

## 2015-07-08 MED ORDER — BLOOD GLUCOSE METER KIT
PACK | 0 refills | Status: DC
Start: 2015-07-08 — End: 2015-07-15

## 2015-07-08 MED ORDER — OXYCODONE-ACETAMINOPHEN 5 MG-325 MG TAB
5-325 mg | ORAL_TABLET | Freq: Two times a day (BID) | ORAL | 0 refills | Status: DC | PRN
Start: 2015-07-08 — End: 2015-10-06

## 2015-07-08 MED ORDER — LANCETS
11 refills | Status: DC
Start: 2015-07-08 — End: 2015-07-15

## 2015-07-08 MED ORDER — BLOOD SUGAR DIAGNOSTIC TEST STRIPS
ORAL_STRIP | 0 refills | Status: DC
Start: 2015-07-08 — End: 2015-07-15

## 2015-07-08 MED ORDER — TRAZODONE 50 MG TAB
50 mg | ORAL_TABLET | Freq: Every evening | ORAL | 2 refills | Status: DC
Start: 2015-07-08 — End: 2015-07-29

## 2015-07-08 NOTE — Patient Instructions (Addendum)
Please come in in two days for blood pressure recheck. Please make sure to take your blood pressure medications  Learning About Diabetes Food Guidelines  Your Care Instructions  Meal planning is important to manage diabetes. It helps keep your blood sugar at a target level (which you set with your doctor). You don't have to eat special foods. You can eat what your family eats, including sweets once in a while. But you do have to pay attention to how often you eat and how much you eat of certain foods.  You may want to work with a dietitian or a certified diabetes educator (CDE) to help you plan meals and snacks. A dietitian or CDE can also help you lose weight if that is one of your goals.  What should you know about eating carbs?  Managing the amount of carbohydrate (carbs) you eat is an important part of healthy meals when you have diabetes. Carbohydrate is found in many foods.  ?? Learn which foods have carbs. And learn the amounts of carbs in different foods.  ?? Bread, cereal, pasta, and rice have about 15 grams of carbs in a serving. A serving is 1 slice of bread (1 ounce), ?? cup of cooked cereal, or 1/3 cup of cooked pasta or rice.  ?? Fruits have 15 grams of carbs in a serving. A serving is 1 small fresh fruit, such as an apple or orange; ?? of a banana; ?? cup of cooked or canned fruit; ?? cup of fruit juice; 1 cup of melon or raspberries; or 2 tablespoons of dried fruit.  ?? Milk and no-sugar-added yogurt have 15 grams of carbs in a serving. A serving is 1 cup of milk or 2/3 cup of no-sugar-added yogurt.  ?? Starchy vegetables have 15 grams of carbs in a serving. A serving is ?? cup of mashed potatoes or sweet potato; 1 cup winter squash; ?? of a small baked potato; ?? cup of cooked beans; or ?? cup cooked corn or green peas.  ?? Learn how much carbs to eat each day and at each meal. A dietitian or CDE can teach you how to keep track of the amount of carbs you eat. This is called carbohydrate counting.   ?? If you are not sure how to count carbohydrate grams, use the Plate Method to plan meals. It is a good, quick way to make sure that you have a balanced meal. It also helps you spread carbs throughout the day.  ?? Divide your plate by types of foods. Put non-starchy vegetables on half the plate, meat or other protein food on one-quarter of the plate, and a grain or starchy vegetable in the final quarter of the plate. To this you can add a small piece of fruit and 1 cup of milk or yogurt, depending on how many carbs you are supposed to eat at a meal.  ?? Try to eat about the same amount of carbs at each meal. Do not "save up" your daily allowance of carbs to eat at one meal.  ?? Proteins have very little or no carbs per serving. Examples of proteins are beef, chicken, [place], fish, eggs, tofu, cheese, cottage cheese, and peanut butter. A serving size of meat is 3 ounces, which is about the size of a deck of cards. Examples of meat substitute serving sizes (equal to 1 ounce of meat) are 1/4 cup of cottage cheese, 1 egg, 1 tablespoon of peanut butter, and ?? cup of tofu.  How  can you eat out and still eat healthy?  ?? Learn to estimate the serving sizes of foods that have carbohydrate. If you measure food at home, it will be easier to estimate the amount in a serving of restaurant food.  ?? If the meal you order has too much carbohydrate (such as potatoes, corn, or baked beans), ask to have a low-carbohydrate food instead. Ask for a salad or green vegetables.  ?? If you use insulin, check your blood sugar before and after eating out to help you plan how much to eat in the future.  ?? If you eat more carbohydrate at a meal than you had planned, take a walk or do other exercise. This will help lower your blood sugar.  What else should you know?  ?? Limit saturated fat, such as the fat from meat and dairy products. This is a healthy choice because people who have diabetes are at higher risk of  heart disease. So choose lean cuts of meat and nonfat or low-fat dairy products. Use olive or canola oil instead of butter or shortening when cooking.  ?? Don't skip meals. Your blood sugar may drop too low if you skip meals and take insulin or certain medicines for diabetes.  ?? Check with your doctor before you drink alcohol. Alcohol can cause your blood sugar to drop too low. Alcohol can also cause a bad reaction if you take certain diabetes medicines.  Follow-up care is a key part of your treatment and safety. Be sure to make and go to all appointments, and call your doctor if you are having problems. It's also a good idea to know your test results and keep a list of the medicines you take.  Where can you learn more?  Go to [employer].  Enter [phone] in the search box to learn more about "Learning About Diabetes Food Guidelines."  Current as of: Nov 03, 2014  Content Version: 11.1  ?? 2006-2016 Healthwise, Inc[employer]. Care instructions adapted under license by [employer] (which disclaims liability or warranty for this information). If you have questions about a medical condition or this instruction, always ask your healthcare professional. [place] any warranty or liability for your use of this information.

## 2015-07-08 NOTE — Progress Notes (Signed)
[name] [name] is a 65 y.o. female in today for follow-up on lung biopsy done at [facility].    Learning assessment previously completed; primary language is [place].    1. Have you been to the ER, urgent care clinic since your last visit?  Hospitalized since your last visit?Yes Reason for visit: [facility], lung bx, January 2016    2. Have you seen or consulted any other health care providers outside of the [facility] since your last visit?  Include any pap smears or colon screening. No

## 2015-07-08 NOTE — Progress Notes (Signed)
Lung Mass (biopsy done, in pain) and Hip Pain (right)        HPI: [name] [name] is a 65 y.o. female [place]  Here for follow up from [facility] admission for N/V. Newly diagnosed diabetic.  I have reviewed available chart notes. [name] [name] was admitted on 1/19 and discharged on 1/24. She was found to have a LLL lung nodule measuring 1.3 x 2.5 cm which was biopsied by IR.  Biopsies report is negative. She has followup with Dr Leonia Reader, pulmonology this Friday. She also was found to have multiple small thyroid cysts.  She was also found to have acute left sided colitis and discharged on Flagyl and Ciprofloxacin, she reports her abdominal pain has improved somewhat and denies fevers. She continues to be nauseous and have occasional vomiting. She has not been able to take her blood pressure medications today due to nausea. She has not been checking her blood sugars as she reports not being given a glucometer. She has been taking her metformin.    She continues to have right sided hip pain after surgery with Dr Salome Arnt.  She has run out of her percocet that was prescribed by Dr Salome Arnt. She is unsure of continued management plan as he is leaving his current practice.    She reports insomnia. This is a chronic problem.She has hx of OSA and was scheduled to see Sleep medicine but had to reschedule due to hospital admission.         Past Medical History   Diagnosis Date   ??? Arthritis    ??? Chronic pain    ??? Diabetes ([place])    ??? GERD (gastroesophageal reflux disease)    ??? Hypertension    ??? Liver disease      HEP C   ??? PTSD (post-traumatic stress disorder)      lived through the [place] in [place] Prescriptions   Medication Sig   ??? oxyCODONE-acetaminophen (PERCOCET) 5-325 mg per tablet Take 1 Tab by mouth every twelve (12) hours as needed for Pain. Max Daily Amount: 2 Tabs.   ??? traZODone (DESYREL) 50 mg tablet Take 1 Tab by mouth nightly.    ??? Blood-Glucose Meter monitoring kit Check sugars 2 times per day, fasting and 2 hours after dinner   ??? Lancets misc Check sugars twice daily   ??? glucose blood VI test strips (ASCENSIA AUTODISC VI, ONE TOUCH ULTRA TEST VI) strip Check sugars twice daily   ??? ciprofloxacin HCl (CIPRO) 500 mg tablet Take 1 Tab by mouth every twelve (12) hours for 5 days.   ??? metFORMIN (GLUCOPHAGE) 500 mg tablet Take 2 Tabs by mouth two (2) times daily (with meals) for 30 days. Indications: type 2 diabetes mellitus   ??? metroNIDAZOLE (FLAGYL) 500 mg tablet Take 1 Tab by mouth three (3) times daily for 5 days.   ??? albuterol (PROVENTIL HFA, VENTOLIN HFA, PROAIR HFA) 90 mcg/actuation inhaler Take 2 Puffs by inhalation every six (6) hours as needed for Wheezing or Shortness of Breath.   ??? SEROQUEL XR 150 mg sr tablet TAKE 1 TABLET BY MOUTH NIGHTLY   ??? amLODIPine (NORVASC) 10 mg tablet Take 1 Tab by mouth daily.   ??? promethazine (PHENERGAN) 25 mg tablet Take 1 Tab by mouth every eight (8) hours as needed.   ??? metoprolol tartrate (LOPRESSOR) 25 mg tablet Take 1 Tab by mouth every twelve (12) hours for 30 days.   ???  cloNIDine HCl (CATAPRES) 0.3 mg tablet Take 1 Tab by mouth two (2) times a day.     No current facility-administered medications for this visit.        Allergies   Allergen Reactions   ??? Chocolate [Cocoa] Sneezing       Past Surgical History   Procedure Laterality Date   ??? Hx gyn  2010     hysterectomy   ??? Hx endoscopy     ??? 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 Hx hip replacement         Family History   Problem Relation Age of Onset   ??? Hypertension Mother    ??? Cancer Maternal Aunt    ??? Alcohol abuse Maternal Grandfather        Social History     Social History   ??? Marital status: MARRIED     Spouse name: N/A   ??? Number of children: N/A   ??? Years of education: N/A     Occupational History   ??? Not on file.     Social History Main Topics   ??? Smoking status: Never Smoker   ??? Smokeless tobacco: Never Used   ??? Alcohol use No    ??? Drug use: No   ??? Sexual activity: No     Other Topics Concern   ??? Not on file     Social History Narrative       Gen- No fevers  Eyes- No dipoplia, blurry vision  CVS- No Chest pain, no palpitations, no edema  Resp- No Cough, SOB, DOE, Wheezing  Neuro- No headaches  Skin- No easy bruising, No rashes       Visit Vitals   ??? BP (!) 174/126 (BP 1 Location: Left arm, BP Patient Position: Sitting)  Comment (BP 1 Location): manual   ??? Pulse (!) 127   ??? Temp 98.4 ??F (36.9 ??C) (Oral)   ??? Resp 22   ??? Ht 5' 7"  (1.702 m)   ??? Wt 198 lb 6.4 oz (90 kg)   ??? SpO2 98%   ??? BMI 31.07 kg/m2       Physical Exam   Constitutional: She is oriented to person, place, and time. She appears well-developed and well-nourished.   Cardiovascular: Normal rate, regular rhythm and normal heart sounds.    No murmur heard.  Pulmonary/Chest: Effort normal and breath sounds normal. No respiratory distress. She has no wheezes. She has no rales.   Abdominal: Soft. Bowel sounds are normal. She exhibits no distension. There is no tenderness.   Neurological: She is alert and oriented to person, place, and time. No cranial nerve deficit. Gait abnormal.   Skin: Skin is warm and dry. She is not diaphoretic.   Psychiatric: She exhibits a depressed mood.   Nursing note and vitals reviewed.          Assesment:  1. Non-intractable vomiting with nausea, unspecified vomiting type  Continue use of zofran. Bland diet. Drink plenty of fluids including gingerale    2. Diabetes mellitus, new onset ([place])  Check blood sugars twice daily.  Will check c-peptide as patient was told she was diagnosed with type 1 diabetes  - HEMOGLOBIN A1C W/O EAG; Future  - C-PEPTIDE; Future  - Blood-Glucose Meter monitoring kit; Check sugars 2 times per day, fasting and 2 hours after dinner  Dispense: 1 Kit; Refill: 0  - Lancets misc; Check sugars twice daily  Dispense: 1 Each; Refill: 11  - glucose  blood VI test strips (ASCENSIA AUTODISC VI, ONE TOUCH ULTRA TEST  VI) strip; Check sugars twice daily  Dispense: 100 Strip; Refill: 0    3. Essential hypertension  Elevated today. Please take medication daily.  Follow up in 2 days for BP check    4. Primary insomnia  Start use of trazodone for sleep. Follow up with sleep medicine  - traZODone (DESYREL) 50 mg tablet; Take 1 Tab by mouth nightly.  Dispense: 30 Tab; Refill: 2    5. PTSD (post-traumatic stress disorder)  Continue use of seroquel    6. Status post right hip replacement  Please check with Dr Marlena Clipper office for continued management  - oxyCODONE-acetaminophen (PERCOCET) 5-325 mg per tablet; Take 1 Tab by mouth every twelve (12) hours as needed for Pain. Max Daily Amount: 2 Tabs.  Dispense: 40 Tab; Refill: 0    7. Thyroid cyst  Referral to Dr Vena Rua office for continued management of thyroid cysts seen on recent ultrasound  - REFERRAL TO ENDOCRINOLOGY      I have discussed the diagnosis with the patient and the intended management  The patient has received an after-visit summary and questions were answered concerning future plans.  I have discussed medication usage, side effects and warnings with the patient as well.I have reviewed the plan of care with the patient, accepted their input and they are in agreement with the treatment goals.         Follow-up Disposition:  Return in about 2 days (around 07/10/2015) for blood pressure for nurse visit.      Lake Bells, MD                .

## 2015-07-09 ENCOUNTER — Inpatient Hospital Stay: Admit: 2015-07-09 | Payer: MEDICARE | Primary: Physician Assistant

## 2015-07-09 ENCOUNTER — Inpatient Hospital Stay
Admit: 2015-07-09 | Discharge: 2015-07-10 | Disposition: A | Discharge status: Home / Self Care | Payer: MEDICARE | Attending: Emergency Medicine

## 2015-07-09 DIAGNOSIS — R1084 Generalized abdominal pain: Secondary | ICD-10-CM

## 2015-07-09 LAB — C-PEPTIDE
C-Peptide: 5.7 ng/mL — ABNORMAL HIGH (ref 1.1–4.4)
C-Peptide: 5.7 ng/mL — ABNORMAL HIGH (ref 1.1–4.4)

## 2015-07-09 LAB — CBC WITH AUTOMATED DIFF
BASOPHILS: 0.1 % (ref 0–3)
EOSINOPHILS: 0 % (ref 0–5)
HCT: 40 % (ref 37.0–50.0)
HGB: 14.1 gm/dl (ref 13.0–17.2)
IMMATURE GRANULOCYTES: 0.7 % (ref 0.0–3.0)
LYMPHOCYTES: 12.4 % — ABNORMAL LOW (ref 28–48)
MCH: 29.3 pg (ref 25.4–34.6)
MCHC: 35.3 gm/dl (ref 30.0–36.0)
MCV: 83 fL (ref 80.0–98.0)
MONOCYTES: 4.5 % (ref 1–13)
MPV: 10.8 fL — ABNORMAL HIGH (ref 6.0–10.0)
NEUTROPHILS: 82.3 % — ABNORMAL HIGH (ref 34–64)
NRBC: 0 (ref 0–0)
PLATELET: 316 10*3/uL (ref 140–450)
RBC: 4.82 M/uL (ref 3.60–5.20)
RDW-SD: 40.2 (ref 36.4–46.3)
WBC: 10.3 10*3/uL (ref 4.0–11.0)

## 2015-07-09 LAB — METABOLIC PANEL, COMPREHENSIVE
ALT (SGPT): 36 U/L (ref 12–78)
AST (SGOT): 14 U/L — ABNORMAL LOW (ref 15–37)
Albumin: 3.5 gm/dl (ref 3.4–5.0)
Alk. phosphatase: 66 U/L (ref 45–117)
BUN: 7 mg/dl (ref 7–25)
Bilirubin, total: 0.6 mg/dl (ref 0.2–1.0)
CO2: 28 mEq/L (ref 21–32)
Calcium: 9.3 mg/dl (ref 8.5–10.1)
Chloride: 98 mEq/L (ref 98–107)
Creatinine: 0.9 mg/dl (ref 0.6–1.3)
GFR est AA: >60
GFR est non-AA: >60
Glucose: 214 mg/dl — ABNORMAL HIGH (ref 74–106)
Potassium: 3.2 mEq/L — ABNORMAL LOW (ref 3.5–5.1)
Protein, total: 8.9 gm/dl — ABNORMAL HIGH (ref 6.4–8.2)
Sodium: 135 mEq/L — ABNORMAL LOW (ref 136–145)

## 2015-07-09 LAB — HEMOGLOBIN A1C W/O EAG: Hemoglobin A1c: 8.6 % — ABNORMAL HIGH (ref 4.2–5.6)

## 2015-07-09 LAB — LIPASE: Lipase: 73 U/L (ref 73–393)

## 2015-07-09 LAB — GLUCOSE, POC: Glucose (POC): 196 mg/dL — ABNORMAL HIGH (ref 65–105)

## 2015-07-09 MED ORDER — SODIUM CHLORIDE 0.9 % IJ SYRG
Freq: Once | INTRAMUSCULAR | Status: AC
Start: 2015-07-09 — End: 2015-07-09
  Administered 2015-07-09: 23:00:00 via INTRAVENOUS

## 2015-07-09 MED ORDER — GLYCOPYRROLATE 0.2 MG/ML IJ SOLN
0.2 mg/mL | INTRAMUSCULAR | Status: AC
Start: 2015-07-09 — End: 2015-07-09
  Administered 2015-07-09: 23:00:00 via INTRAVENOUS

## 2015-07-09 MED ORDER — MORPHINE 4 MG/ML SYRINGE
4 mg/mL | INTRAMUSCULAR | Status: AC
Start: 2015-07-09 — End: 2015-07-09
  Administered 2015-07-09: via INTRAVENOUS

## 2015-07-09 MED ORDER — ONDANSETRON (PF) 4 MG/2 ML INJECTION
4 mg/2 mL | Freq: Once | INTRAMUSCULAR | Status: DC
Start: 2015-07-09 — End: 2015-07-09

## 2015-07-09 MED ORDER — SODIUM CHLORIDE 0.9% BOLUS IV
0.9 % | INTRAVENOUS | Status: AC
Start: 2015-07-09 — End: 2015-07-09
  Administered 2015-07-09: 23:00:00 via INTRAVENOUS

## 2015-07-09 MED ORDER — ONDANSETRON (PF) 4 MG/2 ML INJECTION
4 mg/2 mL | Freq: Once | INTRAMUSCULAR | Status: AC
Start: 2015-07-09 — End: 2015-07-09
  Administered 2015-07-09: 23:00:00 via INTRAVENOUS

## 2015-07-09 MED FILL — GLYCOPYRROLATE 0.2 MG/ML IJ SOLN: 0.2 mg/mL | INTRAMUSCULAR | Qty: 1

## 2015-07-09 MED FILL — ONDANSETRON (PF) 4 MG/2 ML INJECTION: 4 mg/2 mL | INTRAMUSCULAR | Qty: 2

## 2015-07-09 MED FILL — MORPHINE 4 MG/ML SYRINGE: 4 mg/mL | INTRAMUSCULAR | Qty: 1

## 2015-07-09 NOTE — ED Provider Notes (Addendum)
[facility]  Emergency Department Treatment Report    Patient: [name] [name] Age: 65 y.o. Sex: female    Date of Birth: 21-Mar-1951 Admit Date: 07/09/2015 PCP: Lake Bells, MD   MRN: [id number]  CSN: [id number]     Room: ER02/ER02 Time Dictated: 6:09 PM          Chief Complaint   Chief Complaint   Patient presents with   ??? Nausea       History of Present Illness   65 y.o. female who presents to the ED complaining of constant, dull 8 out of 10 generalized abdominal pain and nausea. She was discharged just 2 days ago. She states that she had the same exact pain. Since she has been home she has been dry heaving. She was discharged on antibiotics for her diarrhea which she has not been able to take. She was also diagnosed with new onset diabetes and started on metformin. She was not able to take this medicine either. She has not been able to tolerate any water or tea. Denies fever, dysuria, hematuria    Review of Systems   Constitutional: No fever or chills  Eyes: No visual symptoms  ENT: No sore throat, runny nose, or other URI symptoms  Respiratory: No cough or shortness or breath  Cardiovascular: No chest pain  Gastrointestinal: Positive for generalized abdominal pain, dry heaving, diarrhea. No vomiting  Genitourinary: No dysuria or hematuria  Musculoskeletal: Positive for chronic right hip pain. No swelling  Integumentary: No rashes  Neurological: No headaches    Past Medical/Surgical History     Past Medical History   Diagnosis Date   ??? Arthritis    ??? Chronic pain    ??? Diabetes ([place])    ??? GERD (gastroesophageal reflux disease)    ??? Hypertension    ??? Liver disease      HEP C   ??? PTSD (post-traumatic stress disorder)      lived through the [place] in 1993     Past Surgical History   Procedure Laterality Date   ??? Hx gyn  2010     hysterectomy   ??? Hx endoscopy     ??? 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 Hx hip replacement         Social History     Social History      Social History   ??? Marital status: MARRIED     Spouse name: N/A   ??? Number of children: N/A   ??? Years of education: N/A     Social History Main Topics   ??? Smoking status: Never Smoker   ??? Smokeless tobacco: Never Used   ??? Alcohol use No   ??? Drug use: No   ??? Sexual activity: No     Other Topics Concern   ??? Not on file     Social History Narrative       Family History     Family History   Problem Relation Age of Onset   ??? Hypertension Mother    ??? Cancer Maternal Aunt    ??? Alcohol abuse Maternal Grandfather        Home Medications     Prior to Admission Medications   Prescriptions Last Dose Informant Patient Reported? Taking?   Blood-Glucose Meter monitoring kit   No No   Sig: Check sugars 2 times per day, fasting and 2 hours after dinner   Lancets misc   No  No   Sig: Check sugars twice daily   SEROQUEL XR 150 mg sr tablet   No No   Sig: TAKE 1 TABLET BY MOUTH NIGHTLY   albuterol (PROVENTIL HFA, VENTOLIN HFA, PROAIR HFA) 90 mcg/actuation inhaler   No No   Sig: Take 2 Puffs by inhalation every six (6) hours as needed for Wheezing or Shortness of Breath.   amLODIPine (NORVASC) 10 mg tablet   No No   Sig: Take 1 Tab by mouth daily.   ciprofloxacin HCl (CIPRO) 500 mg tablet   No No   Sig: Take 1 Tab by mouth every twelve (12) hours for 5 days.   cloNIDine HCl (CATAPRES) 0.3 mg tablet   No No   Sig: Take 1 Tab by mouth two (2) times a day.   glucose blood VI test strips (ASCENSIA AUTODISC VI, ONE TOUCH ULTRA TEST VI) strip   No No   Sig: Check sugars twice daily   metFORMIN (GLUCOPHAGE) 500 mg tablet   No No   Sig: Take 2 Tabs by mouth two (2) times daily (with meals) for 30 days. Indications: type 2 diabetes mellitus   metoprolol tartrate (LOPRESSOR) 25 mg tablet   No No   Sig: Take 1 Tab by mouth every twelve (12) hours for 30 days.   metroNIDAZOLE (FLAGYL) 500 mg tablet   No No   Sig: Take 1 Tab by mouth three (3) times daily for 5 days.   oxyCODONE-acetaminophen (PERCOCET) 5-325 mg per tablet   No No    Sig: Take 1 Tab by mouth every twelve (12) hours as needed for Pain. Max Daily Amount: 2 Tabs.   traZODone (DESYREL) 50 mg tablet   No No   Sig: Take 1 Tab by mouth nightly.      Facility-Administered Medications: None       Allergies     Allergies   Allergen Reactions   ??? Chocolate [Cocoa] Sneezing       Physical Exam     Visit Vitals   ??? BP (!) 136/96 (BP 1 Location: Right arm, BP Patient Position: At rest;Sitting)   ??? Pulse 100   ??? Temp 99.8 ??F (37.7 ??C)   ??? Resp 16   ??? Ht _0  (1.676 m)   ??? Wt 83.9 kg (185 lb)   ??? SpO2 100%   ??? BMI 29.86 kg/m2     General appearance: Well developed, well nourished  female, in no acute distress, but does appear uncomfortable while sitting up bed. Husband at bedside  Eyes: Conjunctivae clear, non-icteric  ENT: Mouth/throat: surfaces of the pharynx, palate, and tongue are pink, moist, and without lesions  Respiratory: Clear to auscultation bilaterally  Cardiovascular: Regular, rate and rhythm  GI: Abdomen obese, soft, minimally diffusely tender to palpation. No rebound or guarding  Musculoskeletal: Patient able to move all four extremities. No peripheral edema  Skin: Warm and dry without rashes  Neurologic: Alert, oriented. Answers questions appropriately.    Impression and Management Plan   This is a 65 year old female who was recently discharged 2 days ago with the same generalized abdominal pain and nausea she had during this hospitalization. We will repeat a CBC, CMP, lipase, urinalysis and treat her symptomatically    Diagnostic Studies   Lab:   Recent Results (from the past 12 hour(s))   GLUCOSE, POC    Collection Time: 07/09/15  6:05 PM   Result Value Ref Range    Glucose (POC) 196 (H) 65 -  105 mg/dL   CBC WITH AUTOMATED DIFF    Collection Time: 07/09/15  6:18 PM   Result Value Ref Range    WBC 10.3 4.0 - 11.0 1000/mm3    RBC 4.82 3.60 - 5.20 M/uL    HGB 14.1 13.0 - 17.2 gm/dl    HCT 40.0 37.0 - 50.0 %    MCV 83.0 80.0 - 98.0 fL    MCH 29.3 25.4 - 34.6 pg     MCHC 35.3 30.0 - 36.0 gm/dl    PLATELET 316 140 - 450 1000/mm3    MPV 10.8 (H) 6.0 - 10.0 fL    RDW-SD 40.2 36.4 - 46.3      NRBC 0 0 - 0      IMMATURE GRANULOCYTES 0.7 0.0 - 3.0 %    NEUTROPHILS 82.3 (H) 34 - 64 %    LYMPHOCYTES 12.4 (L) 28 - 48 %    MONOCYTES 4.5 1 - 13 %    EOSINOPHILS 0.0 0 - 5 %    BASOPHILS 0.1 0 - 3 %   METABOLIC PANEL, COMPREHENSIVE    Collection Time: 07/09/15  6:18 PM   Result Value Ref Range    Sodium 135 (L) 136 - 145 mEq/L    Potassium 3.2 (L) 3.5 - 5.1 mEq/L    Chloride 98 98 - 107 mEq/L    CO2 28 21 - 32 mEq/L    Glucose 214 (H) 74 - 106 mg/dl    BUN 7 7 - 25 mg/dl    Creatinine 0.9 0.6 - 1.3 mg/dl    GFR est AA >60.0      GFR est non-AA >60      Calcium 9.3 8.5 - 10.1 mg/dl    AST 14 (L) 15 - 37 U/L    ALT 36 12 - 78 U/L    Alk. phosphatase 66 45 - 117 U/L    Bilirubin, total 0.6 0.2 - 1.0 mg/dl    Protein, total 8.9 (H) 6.4 - 8.2 gm/dl    Albumin 3.5 3.4 - 5.0 gm/dl   LIPASE    Collection Time: 07/09/15  6:18 PM   Result Value Ref Range    Lipase 73 73 - 393 U/L   POC URINE MACROSCOPIC    Collection Time: 07/09/15  7:58 PM   Result Value Ref Range    Glucose 500 (A) NEGATIVE,Negative mg/dl    Bilirubin Negative NEGATIVE,Negative      Ketone Negative NEGATIVE,Negative mg/dl    Specific gravity 1.010 1.005 - 1.030      Blood Trace-intact (A) NEGATIVE,Negative      pH (UA) 6.5 5 - 9      Protein 100 (A) NEGATIVE,Negative mg/dl    Urobilinogen 0.2 0.0 - 1.0 EU/dl    Nitrites Negative NEGATIVE,Negative      Leukocyte Esterase Trace (A) NEGATIVE,Negative      Color Yellow      Appearance Clear     POC URINE MICROSCOPIC    Collection Time: 07/09/15  7:58 PM   Result Value Ref Range    EPITHELIAL CELLS, SQUAMOUS 10-14 /LPF    WBC OCCASIONAL /HPF    RBC OCCASIONAL /HPF    Bacteria OCCASIONAL /HPF     Dr. Hermelinda Medicus did not see any acute S-T segment or T-wave abnormalities that are consistent with acute ischemia or infarction.    Imaging:    No results found.  ED Course    Patient remained stable throughout her stay here in the  ER. Discussed with her the results of all her testing. She initially received 1 liter of IV fluids, IV Zofran, and IV robinul. She continued to have pain therefore IV morphine was given. She was also tachycardic. Suspect secondary to dehydration. She received a second liter of fluids. Patient with mild hypokalemia which was replaced. During my reevaluation she stated she felt much better and that she was hungry. She was given crackers and ginger ale which she tolerated well. Instructed her to follow up with her doctor. Bland diet, clear fluids, slowly advance as tolerated. We prescribed her phenergan as opposed to zofran because she's taking ciprofloxacin for acute colitis, which could cause QT prolongation. Return to the ER if condition worsens or new symptoms develop. She agreed to the plan and all of her questions were answered. At this time felt comfortable discharging her home with outpatient management as she is well-appearing, non-toxic, afebrile, had a benign abdominal exam, and tolerating PO intake    Final Diagnosis       ICD-10-CM ICD-9-CM   1. Abdominal pain, generalized R10.84 789.07   2. Non-intractable vomiting with nausea, unspecified vomiting type R11.2 787.01     Disposition   Patient discharged home in stable condition with the instructions stated above. Prescription for phenergan give    The patient was personally evaluated by myself and Dr. Hermelinda Medicus who agrees with the above assessment and plan      Shelly Coss, PA  July 09, 2015    [facility] medical dictation software was used for portions of this report. Unintended errors may occur.     My signature above authenticates this document and my orders, the final ??  diagnosis (es), discharge prescription (s), and instructions in the Epic ??  record.  If you have any questions please contact [phone].  ??  Nursing notes have been reviewed by the physician/ advanced practice ??   Clinician.

## 2015-07-09 NOTE — ED Triage Notes (Signed)
Pt woke up this morning with upper abdominal pain, nausea and vomiting. Also c/o R leg pain.

## 2015-07-09 NOTE — Progress Notes (Signed)
Preliminary Code R Note:  1st admit                                                                                                         DC  07/07/2015    Inpt    3 CDU      MD  Skandaraj                                                      Dx   Acute left sided colitis                                                                                                              Home Health: [facility]                                                                                                                 Insurance  [place] [employer]are

## 2015-07-09 NOTE — ED Notes (Signed)
Report received from [place], [place]. Assumed care of pt. Pt here in the ED for abdominal pain since today with multiple vomiting and diarrhea. Tenderness on diffuse abdomen. Pt rates 8/10 on pain scale at this time and requesting more pain medicine. Pt was given 4 mg morphine at 1844 with no relief. MD will be notified.

## 2015-07-09 NOTE — Progress Notes (Signed)
Code R Consult Received.    After Hours Consult:     No     Last admission dc date and dx         07/07/2015 acute left sided colitis    What was the discharge plan     Home with [facility]*    Now presents        07/09/2015 abdominal pain.     Case Discussed with ED Physician:     Yes    Case Discussed with Attending Physician:     Yes    Appropriate for Admission:      No     Anticipated Discharge Needs:      home    Disposition if discharged from the ED      home    Readmission interview done (epic)     /N    Readmission assessment completed (compare AD)    /N    [place] complete /N

## 2015-07-09 NOTE — ED Notes (Signed)
10:02 PM  07/09/15     Discharge instructions given to patient (name) with verbalization of understanding. Patient accompanied by husband.  Patient discharged with the following prescriptions phenergan. Patient discharged to home (destination).      Blanch Media Benjamin Stain I, RN

## 2015-07-10 LAB — EKG, 12 LEAD, INITIAL
Atrial Rate: 123 {beats}/min
Calculated P Axis: 72 degrees
Calculated R Axis: 24 degrees
Calculated T Axis: 3 degrees
P-R Interval: 154 ms
Q-T Interval: 314 ms
QRS Duration: 74 ms
QTC Calculation (Bezet): 449 ms
Ventricular Rate: 123 {beats}/min

## 2015-07-10 LAB — POC URINE MACROSCOPIC
Bilirubin: NEGATIVE
Glucose: 500 mg/dl — AB
Ketone: NEGATIVE mg/dl
Nitrites: NEGATIVE
Protein: 100 mg/dl — AB
Specific gravity: 1.01 (ref 1.005–1.030)
Urobilinogen: 0.2 EU/dl (ref 0.0–1.0)
pH (UA): 6.5 (ref 5–9)

## 2015-07-10 LAB — POC URINE MICROSCOPIC

## 2015-07-10 MED ORDER — PROMETHAZINE 25 MG TAB
25 mg | ORAL_TABLET | Freq: Three times a day (TID) | ORAL | 0 refills | Status: DC | PRN
Start: 2015-07-10 — End: 2015-08-03

## 2015-07-10 MED ORDER — PROMETHAZINE 25 MG TAB
25 mg | ORAL_TABLET | Freq: Three times a day (TID) | ORAL | 0 refills | Status: DC | PRN
Start: 2015-07-10 — End: 2015-07-09

## 2015-07-10 MED ORDER — POTASSIUM CHLORIDE SR 10 MEQ TAB
10 mEq | ORAL | Status: AC
Start: 2015-07-10 — End: 2015-07-09
  Administered 2015-07-10: 01:00:00 via ORAL

## 2015-07-10 MED ORDER — SODIUM CHLORIDE 0.9% BOLUS IV
0.9 % | Freq: Once | INTRAVENOUS | Status: AC
Start: 2015-07-10 — End: 2015-07-09
  Administered 2015-07-10: 01:00:00 via INTRAVENOUS

## 2015-07-10 MED FILL — POTASSIUM CHLORIDE SR 10 MEQ TAB: 10 mEq | ORAL | Qty: 4

## 2015-07-13 NOTE — Telephone Encounter (Signed)
[name] [name] did not show up for her hospital follow up appointment with lung specialist.  Office called to give message

## 2015-07-14 NOTE — Telephone Encounter (Signed)
Patient was unaware of her appointment. Patients husband was given the number to Dr. Helene Shoe to reschedule

## 2015-07-15 ENCOUNTER — Encounter

## 2015-07-15 MED ORDER — BLOOD SUGAR DIAGNOSTIC TEST STRIPS
ORAL_STRIP | 5 refills | Status: DC
Start: 2015-07-15 — End: 2018-08-05

## 2015-07-15 MED ORDER — LANCETS
2 refills | Status: DC
Start: 2015-07-15 — End: 2018-08-05

## 2015-07-15 MED ORDER — BLOOD GLUCOSE METER KIT
PACK | 0 refills | Status: DC
Start: 2015-07-15 — End: 2018-08-05

## 2015-07-23 ENCOUNTER — Inpatient Hospital Stay
Admit: 2015-07-23 | Discharge: 2015-07-24 | Disposition: A | Discharge status: Home / Self Care | Payer: MEDICARE | Attending: Emergency Medicine

## 2015-07-23 ENCOUNTER — Emergency Department: Admit: 2015-07-23 | Payer: MEDICARE | Primary: Physician Assistant

## 2015-07-23 DIAGNOSIS — I10 Essential (primary) hypertension: Secondary | ICD-10-CM

## 2015-07-23 LAB — POC CHEM8
BUN: 7 mg/dl (ref 7–25)
CALCIUM,IONIZED: 4.4 mg/dL (ref 4.40–5.40)
CO2, TOTAL: 25 mmol/L (ref 21–32)
Chloride: 100 mEq/L (ref 98–107)
Creatinine: 0.6 mg/dl (ref 0.6–1.3)
Glucose: 144 mg/dL — ABNORMAL HIGH (ref 74–106)
HCT: 48 % — ABNORMAL HIGH (ref 38–45)
HGB: 16.3 gm/dl (ref 12.4–17.2)
Potassium: 3.1 mEq/L — ABNORMAL LOW (ref 3.5–4.9)
Sodium: 142 mEq/L (ref 136–145)

## 2015-07-23 LAB — POC URINE MACROSCOPIC
Glucose: NEGATIVE mg/dl
Ketone: NEGATIVE mg/dl
Leukocyte Esterase: NEGATIVE
Nitrites: NEGATIVE
Protein: >=300 mg/dl — AB
Specific gravity: >=1.03 (ref 1.005–1.030)
Urobilinogen: 0.2 EU/dl (ref 0.0–1.0)
pH (UA): 6 (ref 5–9)

## 2015-07-23 MED ORDER — AMLODIPINE 5 MG TAB
5 mg | ORAL | Status: AC
Start: 2015-07-23 — End: 2015-07-23
  Administered 2015-07-23: 20:00:00 via ORAL

## 2015-07-23 MED ORDER — LABETALOL 5 MG/ML IV SOLN
5 mg/mL | INTRAVENOUS | Status: AC
Start: 2015-07-23 — End: 2015-07-23
  Administered 2015-07-23: 23:00:00 via INTRAVENOUS

## 2015-07-23 MED ORDER — AMLODIPINE 5 MG TAB
5 mg | Freq: Every day | ORAL | Status: DC
Start: 2015-07-23 — End: 2015-07-23

## 2015-07-23 MED ORDER — METHYLPREDNISOLONE (PF) 125 MG/2 ML IJ SOLR
125 mg/2 mL | Freq: Once | INTRAMUSCULAR | Status: AC
Start: 2015-07-23 — End: 2015-07-23
  Administered 2015-07-23: 19:00:00 via INTRAVENOUS

## 2015-07-23 MED ORDER — SODIUM CHLORIDE 0.9% BOLUS IV
0.9 % | INTRAVENOUS | Status: AC
Start: 2015-07-23 — End: 2015-07-23
  Administered 2015-07-23: 19:00:00 via INTRAVENOUS

## 2015-07-23 MED ORDER — DIAZEPAM 5 MG TAB
5 mg | ORAL | Status: DC
Start: 2015-07-23 — End: 2015-07-23

## 2015-07-23 MED ORDER — OXYCODONE-ACETAMINOPHEN 5 MG-325 MG TAB
5-325 mg | ORAL | Status: AC
Start: 2015-07-23 — End: 2015-07-23
  Administered 2015-07-23: 23:00:00 via ORAL

## 2015-07-23 MED ORDER — SODIUM CHLORIDE 0.9 % IJ SYRG
Freq: Once | INTRAMUSCULAR | Status: AC
Start: 2015-07-23 — End: 2015-07-23
  Administered 2015-07-23: 19:00:00 via INTRAVENOUS

## 2015-07-23 MED ORDER — IPRATROPIUM-ALBUTEROL 2.5 MG-0.5 MG/3 ML NEB SOLUTION
2.5 mg-0.5 mg/3 ml | RESPIRATORY_TRACT | Status: AC
Start: 2015-07-23 — End: 2015-07-23
  Administered 2015-07-23: 19:00:00 via RESPIRATORY_TRACT

## 2015-07-23 MED ORDER — ONDANSETRON (PF) 4 MG/2 ML INJECTION
4 mg/2 mL | Freq: Once | INTRAMUSCULAR | Status: AC
Start: 2015-07-23 — End: 2015-07-23
  Administered 2015-07-23: 19:00:00 via INTRAVENOUS

## 2015-07-23 MED ORDER — DIAZEPAM 5 MG/ML SYRINGE
5 mg/mL | INTRAMUSCULAR | Status: AC
Start: 2015-07-23 — End: 2015-07-23
  Administered 2015-07-23: via INTRAVENOUS

## 2015-07-23 MED ORDER — POTASSIUM CHLORIDE SR 10 MEQ TAB
10 mEq | ORAL | Status: AC
Start: 2015-07-23 — End: 2015-07-23
  Administered 2015-07-23: 20:00:00 via ORAL

## 2015-07-23 MED ORDER — MORPHINE 4 MG/ML SYRINGE
4 mg/mL | INTRAMUSCULAR | Status: AC
Start: 2015-07-23 — End: 2015-07-23
  Administered 2015-07-23: via INTRAVENOUS

## 2015-07-23 MED ORDER — LABETALOL 5 MG/ML IV SOLN
5 mg/mL | INTRAVENOUS | Status: AC
Start: 2015-07-23 — End: 2015-07-23
  Administered 2015-07-23: 22:00:00 via INTRAVENOUS

## 2015-07-23 MED ORDER — METHOCARBAMOL 750 MG TAB
750 mg | ORAL | Status: AC
Start: 2015-07-23 — End: 2015-07-23
  Administered 2015-07-23: 19:00:00 via ORAL

## 2015-07-23 MED FILL — ONDANSETRON (PF) 4 MG/2 ML INJECTION: 4 mg/2 mL | INTRAMUSCULAR | Qty: 2

## 2015-07-23 MED FILL — POTASSIUM CHLORIDE SR 10 MEQ TAB: 10 mEq | ORAL | Qty: 4

## 2015-07-23 MED FILL — DIAZEPAM 5 MG/ML SYRINGE: 5 mg/mL | INTRAMUSCULAR | Qty: 2

## 2015-07-23 MED FILL — METHOCARBAMOL 750 MG TAB: 750 mg | ORAL | Qty: 1

## 2015-07-23 MED FILL — LABETALOL 5 MG/ML IV SOLN: 5 mg/mL | INTRAVENOUS | Qty: 20

## 2015-07-23 MED FILL — SOLU-MEDROL (PF) 125 MG/2 ML SOLUTION FOR INJECTION: 125 mg/2 mL | INTRAMUSCULAR | Qty: 2

## 2015-07-23 MED FILL — IPRATROPIUM-ALBUTEROL 2.5 MG-0.5 MG/3 ML NEB SOLUTION: 2.5 mg-0.5 mg/3 ml | RESPIRATORY_TRACT | Qty: 3

## 2015-07-23 MED FILL — MORPHINE 4 MG/ML SYRINGE: 4 mg/mL | INTRAMUSCULAR | Qty: 1

## 2015-07-23 MED FILL — OXYCODONE-ACETAMINOPHEN 5 MG-325 MG TAB: 5-325 mg | ORAL | Qty: 1

## 2015-07-23 MED FILL — AMLODIPINE 5 MG TAB: 5 mg | ORAL | Qty: 2

## 2015-07-23 NOTE — ED Triage Notes (Signed)
C/o vomiting since yesterday with headache; chronic lower back pain and hip pain.

## 2015-07-23 NOTE — ED Provider Notes (Addendum)
[facility]  Emergency Department Treatment Report    Patient: [name] [name] Age: 65 y.o. Sex: female    Date of Birth: 1951/05/06 Admit Date: 07/23/2015 PCP: Lake Bells, MD   MRN: [id number]  CSN: [id number]     Room: ER39/ER39 Time Dictated: 2:39 PM          Chief Complaint   Chief Complaint   Patient presents with   ??? Vomiting   ??? Back Pain   ??? Headache       History of Present Illness   65 y.o. female presents to ED complaining of vomiting, back pain, headache. She states that she has been having nausea, vomiting, diarrhea since she's been discharged from the hospital on 07/07/15. She's been given prescriptions for zofran, phenergan, and antibiotics for treatment. Denies fever, abdominal pain, or irritative urinary symptoms. Her main concern today is her headache and elevated blood pressure. She is suppose to be on multiple BP medications but she said she's ran out of them except Metoprolol which she took prior to arrival. Husband states she's on 6 different medications for BP but they don't know the names of them. She hasn't followed up with her PCP yet. Denies chest pain or shortness of breath. She said when her blood pressure is elevated she gets a constant frontal headache and today feels the same. She took aspirin with no relief. She has history of chronic back pain and is complaining of it today.    Review of Systems   Constitutional: No fever or chills  Eyes: No visual symptoms  ENT: No sore throat, runny nose, or other URI symptoms  Respiratory: No cough or shortness or breath  Cardiovascular: No chest pain  Gastrointestinal: Positive for nausea, vomiting, diarrhea. No abdominal pain  Genitourinary: No dysuria or hematuria  Musculoskeletal: Positive for chronic back pain   Integumentary: No rashes  Neurological: Positive for headache     Past Medical/Surgical History     Past Medical History   Diagnosis Date   ??? Arthritis    ??? Chronic pain    ??? Diabetes ([place])     ??? GERD (gastroesophageal reflux disease)    ??? Hypertension    ??? Liver disease      HEP C   ??? PTSD (post-traumatic stress disorder)      lived through the [place] in 1993     Past Surgical History   Procedure Laterality Date   ??? Hx gyn  2010     hysterectomy   ??? Hx endoscopy     ??? 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 Hx hip replacement         Social History     Social History     Social History   ??? Marital status: MARRIED     Spouse name: N/A   ??? Number of children: N/A   ??? Years of education: N/A     Social History Main Topics   ??? Smoking status: Never Smoker   ??? Smokeless tobacco: Never Used   ??? Alcohol use No   ??? Drug use: No   ??? Sexual activity: No     Other Topics Concern   ??? None     Social History Narrative       Family History     Family History   Problem Relation Age of Onset   ??? Hypertension Mother    ??? Cancer Maternal Aunt    ???  Alcohol abuse Maternal Grandfather        Home Medications     Prior to Admission Medications   Prescriptions Last Dose Informant Patient Reported? Taking?   Blood-Glucose Meter monitoring kit   No No   Sig: Check sugars 2 times per day, fasting and 2 hours after dinner   Lancets misc   No No   Sig: Check sugars twice daily   SEROQUEL XR 150 mg sr tablet   No No   Sig: TAKE 1 TABLET BY MOUTH NIGHTLY   albuterol (PROVENTIL HFA, VENTOLIN HFA, PROAIR HFA) 90 mcg/actuation inhaler   No No   Sig: Take 2 Puffs by inhalation every six (6) hours as needed for Wheezing or Shortness of Breath.   amLODIPine (NORVASC) 10 mg tablet   No No   Sig: Take 1 Tab by mouth daily.   cloNIDine HCl (CATAPRES) 0.3 mg tablet   No No   Sig: Take 1 Tab by mouth two (2) times a day.   glucose blood VI test strips (ASCENSIA AUTODISC VI, ONE TOUCH ULTRA TEST VI) strip   No No   Sig: Check sugars twice daily   metFORMIN (GLUCOPHAGE) 500 mg tablet   No No   Sig: Take 2 Tabs by mouth two (2) times daily (with meals) for 30 days. Indications: type 2 diabetes mellitus    metoprolol tartrate (LOPRESSOR) 25 mg tablet   No No   Sig: Take 1 Tab by mouth every twelve (12) hours for 30 days.   oxyCODONE-acetaminophen (PERCOCET) 5-325 mg per tablet   No No   Sig: Take 1 Tab by mouth every twelve (12) hours as needed for Pain. Max Daily Amount: 2 Tabs.   promethazine (PHENERGAN) 25 mg tablet   No No   Sig: Take 1 Tab by mouth every eight (8) hours as needed.   traZODone (DESYREL) 50 mg tablet   No No   Sig: Take 1 Tab by mouth nightly.      Facility-Administered Medications: None       Allergies     Allergies   Allergen Reactions   ??? Chocolate [Cocoa] Sneezing       Physical Exam     Visit Vitals   ??? BP (!) 178/121   ??? Pulse 96   ??? Temp 98.3 ??F (36.8 ??C)   ??? Resp 23   ??? Ht 5' 7" (1.702 m)   ??? Wt 83.9 kg (185 lb)   ??? SpO2 93%   ??? BMI 28.98 kg/m2     General appearance: Well developed, well nourished  female, in no acute distress, sitting up in bed. Husband at bedside  Eyes: Conjunctivae clear, lids normal. Pupils equal, symmetrical, and normally reactive. Extraocular movements intact   ENT: Mouth/throat: surfaces of the pharynx, palate, and tongue are pink, moist, and without lesions  Respiratory:Scattered expiratory wheezing. No respiratory distress, speaking full sentences  Cardiovascular: Regular, rate and rhythm  GI: Abdomen obese, soft, nontender. No rebound or guarding   Musculoskeletal: Patient able to move all four extremities. No peripheral edema  Skin: Warm and dry without rashes  Neurologic: Alert, oriented. Answers questions appropriately. No focal deficits    Impression and Management Plan   This is a 65 year old female with history of hypertension and chronic back pain who presents to ED complaining of hypertension, nausea, vomiting, diarrhea, chronic back pain and headache . We will check an i-STAT, urinalysis, chest x-ray, and treat her symptomatically     Diagnostic Studies  Lab:   Recent Results (from the past 12 hour(s))   POC CHEM8    Collection Time: 07/23/15  2:34 PM    Result Value Ref Range    Sodium 142 136 - 145 mEq/L    Potassium 3.1 (L) 3.5 - 4.9 mEq/L    Chloride 100 98 - 107 mEq/L    CO2, TOTAL 25 21 - 32 mmol/L    Glucose 144 (H) 74 - 106 mg/dL    BUN 7 7 - 25 mg/dl    Creatinine 0.6 0.6 - 1.3 mg/dl    HCT 48 (H) 38 - 45 %    HGB 16.3 12.4 - 17.2 gm/dl    CALCIUM,IONIZED 4.40 4.40 - 5.40 mg/dL   POC URINE MACROSCOPIC    Collection Time: 07/23/15  4:25 PM   Result Value Ref Range    Glucose Negative NEGATIVE,Negative mg/dl    Bilirubin Small (A) NEGATIVE,Negative      Ketone Negative NEGATIVE,Negative mg/dl    Specific gravity >=1.030 1.005 - 1.030      Blood Small (A) NEGATIVE,Negative      pH (UA) 6.0 5 - 9      Protein >=300 (A) NEGATIVE,Negative mg/dl    Urobilinogen 0.2 0.0 - 1.0 EU/dl    Nitrites Negative NEGATIVE,Negative      Leukocyte Esterase Negative NEGATIVE,Negative      Color Brown      Appearance Cloudy         Imaging:    Xr Chest Pa Lat    Result Date: 07/23/2015  INDICATION: cough   EXAMINATION: XR CHEST PA LAT COMPARISON: 07/06/2015 FINDINGS: The study shows a normal sized heart..2 centimeter nodule at the left lower lobe. Fibrotic stranding in the lingular segment.. COPD.     IMPRESSION: 1. 2 cm left lower lobe nodule. 2. Left lingular fibrosis. 3. COPD.     ED Course   Patient remained stable throughout her stay here in the ER. Discussed with her the results of her testing and x-ray including incidental findings. She was slightly hypokalemic which was replaced with by mouth K-Dur. She was hydrated with a liter of IV fluids and IV Zofran. She had no episodes of vomiting or diarrhea while here in the ER. She did receive a breathing treatment and IV Solu-Medrol for her wheezing. During reevaluation, she stated that she felt much better. She was no longer wheezing. She was overdue for her normal breathing treatment at home. She received by mouth Robaxin for her chronic back pain. She continued to complain of pain. She  normally takes Percocet at home but did not take it prior to arrival. She did receive by mouth Percocet here. I did review [facility] prescription [facility] and she received a 20 day supply of Percocet at home on 07/08/15. She states that that was not helpful. She then received IV morphine and IV Valium. Regarding her blood pressure, she received her normal dose of by mouth Norvasc. She said that her headache had improved. Her blood pressure continued to be elevated therefore she received 2 doses of IV labetalol. She is asymptomatic, denies chest pain or shortness of breath. I did refill all her blood pressure medications. Instructed her to follow up Dr. Gaetano Net within 7 days. Take blood pressure medications as prescribed. Do not drive while medicated on robaxin. Return to the ER if condition worsens or new symptoms develop. She agreed to the plan and all of her questions were answered.     Final Diagnosis  ICD-10-CM ICD-9-CM   1. Uncontrolled hypertension I10 401.9   2. Chronic bilateral low back pain without sciatica M54.5 724.2    G89.29 338.29   3. Nausea vomiting and diarrhea R11.2 787.91    R19.7 787.01   4. Essential hypertension with goal blood pressure less than 140/90 I10 401.9     Disposition   Patient discharged home in stable condition with the instructions stated above. Prescriptions for metoprolol, norvasc, clonidine, robaxin were given.    The patient was personally evaluated by myself and Dr. Sonny Masters who agrees with the above assessment and plan      Shelly Coss, PA  July 23, 2015     Continuation by Sedonia Small, MD:  I interviewed and examined the patient. I discussed with the advanced practice provider, agree with the evaluation and plan as documented. Workup refused I reevaluated patient repeated occasions in the emergency department. She received repeated doses of pain all 20 mg IV as well as a dose of clonidine 0.1 mg PO, was treated symptomatically for nausea  vomiting as well as for pain and/or reevaluation feels much better her blood pressure has improved where diastolics are in the 737'V.  I discussed with her the importance of being compliant with her blood pressure medications and the risks of possible stroke possible heart attack possible renal failure if she is not compliant with her medications.  She is currently asymptomatic from  her blood pressure being elevated, She states she feels much better and wants to go home and will follow-up with her doctor for blood pressure recheck within the next few days and prescriptions for blood pressure medicines are given to the patient along with discharge instructions.    Sedonia Small MD    Dragon medical dictation software was used for portions of this report. Unintended errors may occur.     My signature above authenticates this document and my orders, the final ??  diagnosis (es), discharge prescription (s), and instructions in the Epic ??  record.  If you have any questions please contact [phone].  ??  Nursing notes have been reviewed by the physician/ advanced practice ??  Clinician.

## 2015-07-23 NOTE — ED Notes (Signed)
Care assumed pt in no distress continues with bp elevated states pain is better after valuim given as ordered will continue to monitor family at bedside

## 2015-07-23 NOTE — Progress Notes (Signed)
Preliminary Code R Note:   1st admit                                                                                                          DC  07/07/2015    Inpt   3 CDU     MD   Skandaraj                                                   Dx  Acute Colitis.                                                                                                              Home Health: [facility].                                                                                                            Insurance   [employer]

## 2015-07-23 NOTE — Progress Notes (Signed)
Code R: Discharged on 07/07/2015 after an inpatient  stay for colitis.  Discharged to home with Home Health. Presented to ED today for high blood pressure, pain in back  Pt seen and discharged.

## 2015-07-23 NOTE — Telephone Encounter (Signed)
Pt called to ask about her care.

## 2015-07-23 NOTE — ED Notes (Signed)
Written verbal instuctions given pt in no distress.

## 2015-07-24 ENCOUNTER — Ambulatory Visit: Attending: Physical Medicine & Rehabilitation | Primary: Physician Assistant

## 2015-07-24 ENCOUNTER — Ambulatory Visit
Admit: 2015-07-24 | Discharge: 2015-07-24 | Payer: MEDICARE | Attending: Physical Medicine & Rehabilitation | Primary: Physician Assistant

## 2015-07-24 ENCOUNTER — Encounter

## 2015-07-24 DIAGNOSIS — M461 Sacroiliitis, not elsewhere classified: Secondary | ICD-10-CM

## 2015-07-24 MED ORDER — METOPROLOL TARTRATE 25 MG TAB
25 mg | ORAL_TABLET | Freq: Two times a day (BID) | ORAL | 0 refills | Status: DC
Start: 2015-07-24 — End: 2015-07-24

## 2015-07-24 MED ORDER — CLONIDINE 0.3 MG TAB
0.3 mg | ORAL_TABLET | Freq: Two times a day (BID) | ORAL | 0 refills | Status: DC
Start: 2015-07-24 — End: 2015-07-24

## 2015-07-24 MED ORDER — AMLODIPINE 10 MG TAB
10 mg | ORAL_TABLET | Freq: Every day | ORAL | 1 refills | Status: DC
Start: 2015-07-24 — End: 2015-07-24

## 2015-07-24 MED ORDER — CLONIDINE 0.1 MG TAB
0.1 mg | Freq: Once | ORAL | Status: AC | PRN
Start: 2015-07-24 — End: 2015-07-23
  Administered 2015-07-24: 01:00:00 via ORAL

## 2015-07-24 MED ORDER — DICLOFENAC 75 MG TAB, DELAYED RELEASE
75 mg | ORAL_TABLET | Freq: Two times a day (BID) | ORAL | 5 refills | Status: DC | PRN
Start: 2015-07-24 — End: 2015-08-03

## 2015-07-24 MED ORDER — METHOCARBAMOL 750 MG TAB
750 mg | ORAL_TABLET | Freq: Three times a day (TID) | ORAL | 0 refills | Status: DC
Start: 2015-07-24 — End: 2015-12-18

## 2015-07-24 MED FILL — CLONIDINE 0.1 MG TAB: 0.1 mg | ORAL | Qty: 1

## 2015-07-24 NOTE — Telephone Encounter (Signed)
Spoke with patient. She reports going to ER for elevated blood pressure and difficulty breathing.

## 2015-07-24 NOTE — Progress Notes (Signed)
[facility]  [address] [place] [address]  Phone: [phone]  Fax: [phone]        [name] [name], [name] [name]  DOB: 03-20-1951  PCP: Lake Bells, MD  07/24/2015    NEW PATIENT      ASSESSMENT AND PLAN     [name] [name] comes in to the office today c/o 8/10 aching lumbar pain that radiates down the lateral portion of her right leg and ends at her knee x 2 years. She has also recently had a right hip replacement with Dr. Salome Arnt about 6 months ago. Pt also has complaints of right foot numbness. Her pain appears to be due to sacroiliitis.    She was referred for a right SI joint injection to see if it will provide relief. If it does not I may refer her for a lumbar MRI to further evaluate her potential radiculopathy. Pt was prescribed diclofenac 84m BID prn. The risks, benefits, and potential side effects of this medication were discussed.     Pt will f/u in 3 weeks or prn.      MJuliettewas seen today for back pain, hip pain and leg pain.    Diagnoses and all orders for this visit:    Sacroiliitis ([place]  -     SCHEDULE SURGERY  -     diclofenac EC (VOLTAREN) 75 mg EC tablet; Take 1 Tab by mouth two (2) times daily as needed.    Spine pain, lumbar  -     [72100] L/S 2-3 views  -     diclofenac EC (VOLTAREN) 75 mg EC tablet; Take 1 Tab by mouth two (2) times daily as needed.         Follow-up Disposition:  Return in about 3 weeks (around 08/14/2015), or if symptoms worsen or fail to improve.    CHIEF COMPLAINT  [name] KOVALis seen today in consultation at the request of JLake Bells MD for complaints of lumbar pain.       HISTORY OF PRESENT ILLNESS  [name] DEBOSEis a 66y.o. female c/o 8/10 aching lumbar pain that radiates down the lateral portion of her right leg and ends at her knee x 2 years. She has also recently had a right hip replacement with Dr. ASalome Arntabout 6 months ago. Pt also has complaints of right foot numbness. She has  taken Percocet for her pain. Standing, walking, coughing, sneezing, lifting, and bending forward exacerbate her pain. Lying down and changing positions relieve her pain.    Pt denies any fevers, chills, nausea, vomiting. Pt denies any chest pain and shortness of breath. Pt denies any ear, nose, and throat problems. Pt denies any fecal or urinary incontinence.     She is not working.      PAST MEDICAL HISTORY   Past Medical History   Diagnosis Date   ??? Arthritis    ??? Chronic pain    ??? Diabetes ([place]    ??? GERD (gastroesophageal reflux disease)    ??? Hypertension    ??? Liver disease      HEP C   ??? PTSD (post-traumatic stress disorder)      [place]       Past Surgical History   Procedure Laterality Date   ??? Hx gyn  2010     hysterectomy   ??? Hx endoscopy     ???  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 Hx hip replacement         MEDICATIONS      Current Outpatient Prescriptions   Medication Sig Dispense Refill   ??? metoprolol tartrate (LOPRESSOR) 25 mg tablet Take 1 Tab by mouth every twelve (12) hours for 30 days. 60 Tab 0   ??? amLODIPine (NORVASC) 10 mg tablet Take 1 Tab by mouth daily. 30 Tab 1   ??? cloNIDine HCl (CATAPRES) 0.3 mg tablet Take 1 Tab by mouth two (2) times a day for 30 days. 60 Tab 0   ??? methocarbamol (ROBAXIN) 750 mg tablet Take 1 Tab by mouth three (3) times daily. 20 Tab 0   ??? Lancets misc Check sugars twice daily 200 Each 2   ??? glucose blood VI test strips (ASCENSIA AUTODISC VI, ONE TOUCH ULTRA TEST VI) strip Check sugars twice daily 100 Strip 5   ??? promethazine (PHENERGAN) 25 mg tablet Take 1 Tab by mouth every eight (8) hours as needed. 12 Tab 0   ??? traZODone (DESYREL) 50 mg tablet Take 1 Tab by mouth nightly. 30 Tab 2   ??? metFORMIN (GLUCOPHAGE) 500 mg tablet Take 2 Tabs by mouth two (2) times daily (with meals) for 30 days. Indications: type 2 diabetes mellitus 120 Tab 0   ??? albuterol (PROVENTIL HFA, VENTOLIN HFA, PROAIR HFA) 90 mcg/actuation  inhaler Take 2 Puffs by inhalation every six (6) hours as needed for Wheezing or Shortness of Breath. 1 Inhaler 0   ??? SEROQUEL XR 150 mg sr tablet TAKE 1 TABLET BY MOUTH NIGHTLY 30 Tab 2   ??? Blood-Glucose Meter monitoring kit Check sugars 2 times per day, fasting and 2 hours after dinner 1 Kit 0   ??? oxyCODONE-acetaminophen (PERCOCET) 5-325 mg per tablet Take 1 Tab by mouth every twelve (12) hours as needed for Pain. Max Daily Amount: 2 Tabs. 40 Tab 0       ALLERGIES    Allergies   Allergen Reactions   ??? Chocolate [Cocoa] Sneezing          SOCIAL HISTORY    Social History     Social History   ??? Marital status: MARRIED     Spouse name: N/A   ??? Number of children: N/A   ??? Years of education: N/A     Social History Main Topics   ??? Smoking status: Never Smoker   ??? Smokeless tobacco: Never Used   ??? Alcohol use No   ??? Drug use: No   ??? Sexual activity: No     Other Topics Concern   ??? Not on file     Social History Narrative     Social History Narrative      Problem Relation Age of Onset   ??? Hypertension Mother    ??? Cancer Maternal Aunt    ??? Alcohol abuse Maternal Grandfather          REVIEW OF SYSTEMS  Review of Systems   Constitutional: Positive for diaphoresis (night sweats). Negative for chills, fever, malaise/fatigue and weight loss.   Respiratory: Negative for shortness of breath.    Cardiovascular: Negative for chest pain and leg swelling.   Gastrointestinal: Negative for constipation, nausea and vomiting.   Neurological: Negative for dizziness, tingling, seizures, loss of consciousness and headaches.   Psychiatric/Behavioral: The patient does not have insomnia.          PHYSICAL EXAMINATION  Visit Vitals   ??? BP (!) 155/109  Comment:  Tanika LPN aware   ??? Pulse 77   ??? Temp 97.7 ??F (36.5 ??C) (Oral)   ??? Resp 18   ??? Ht 5' 7" (1.702 m)   ??? Wt 191 lb 9.6 oz (86.9 kg)   ??? BMI 30.01 kg/m2         Pain Assessment  05/26/2015   Location of Pain Hip   Location Modifiers Right   Severity of Pain 8   Quality of Pain Aching    Duration of Pain Persistent   Frequency of Pain Constant   Aggravating Factors Exercise;Standing;Walking   Aggravating Factors Comment worse at night   Limiting Behavior Yes   Relieving Factors Elevation;Rest   Result of Injury -         Constitutional:  Well developed, well nourished, in no acute distress.   Psychiatric: Affect and mood are appropriate.   HEENT: Normocephalic, atraumatic. Extraocular movements intact.   Integumentary: No rashes or abrasions noted on exposed areas.    Cardiovascular: Regular rate and rhythm.   Pulmonary: Clear to auscultation bilaterally.    SPINE/MUSCULOSKELETAL EXAM    Cervical spine:  Neck is midline.   Normal muscle tone.   No focal atrophy is noted.   ROM pain free.   Shoulder ROM intact.   No tenderness to palpation.  Negative Spurling's sign.   Negative Tinel's sign.   Negative Hoffman's sign.               Sensation in the bilateral arms grossly intact to light touch.     Lumbar spine:  No rash, ecchymosis, or gross obliquity.   No fasciculations.   No focal atrophy is noted.   No pain with hip ROM.   Full range of motion.  Tenderness to palpation at right buttock.  No tenderness to palpation at the sciatic notch.   Right SI joint tender.   Positive right FABER test. Negative left FABER test.  Negative bilateral p4 test.  Negative compression test.  Negative distraction test.  Positive right Gaenslen test.  Trochanters non tender.  Pain with back extension.     Sensation in the bilateral legs grossly intact to light touch.      MOTOR:      Biceps  Triceps Deltoids Wrist Ext Wrist Flex Hand Intrin   Right 5/5 5/5 5/5 5/5 5/5 5/5   Left 5/5 5/5 5/5 5/5 5/5 5/5             Hip Flex  Quads Hamstrings Ankle DF EHL Ankle PF   Right 5/5 5/5 5/5 5/5 5/5 5/5   Left 5/5 5/5 5/5 5/5 5/5 5/5     DTRs are 2+ biceps, triceps, brachioradialis, patella, and Achilles.    Positive right straight leg raise.  Squat not tested.   No difficulty with tandem gait.   Normal heel walk.    Normal toe rise.      Ambulation with quad cane. FWB.      RADIOGRAPHS  Lumbar XR images taken on 07/24/2015 personally reviewed with patient:  1) Right hip replacement  2) Mild rotation of the spine with a slight curvature to the right  3) Good overall disc spacing  4) No obvious fractures    PMP reviewed    [name] [name] has a reminder for a "due or due soon" health maintenance. I have asked that she contact her primary care provider for follow-up on this health maintenance.     This plan was reviewed with the patient  and patient agrees. All questions were answered. More than half of this visit today was spent on counseling.    Written by Elvina Mattes, [place], as dictated by Dr. Marijo File.     I, Dr. Marijo File, confirm that all documentation is accurate.

## 2015-07-24 NOTE — Patient Instructions (Signed)
Sacroiliac Pain: Exercises  Your Care Instructions  Here are some examples of typical rehabilitation exercises for your condition. Start each exercise slowly. Ease off the exercise if you start to have pain.  Your doctor or physical therapist will tell you when you can start these exercises and which ones will work best for you.  How to do the exercises  Knee-to-chest stretch    Do not do the knee-to-chest exercise if it causes or increases back or leg pain.  1. Lie on your back with your knees bent and your feet flat on the floor. You can put a small pillow under your head and neck if it is more comfortable.  2. Grasp your hands under one knee and bring the knee to your chest, keeping the other foot flat on the floor.  3. Keep your lower back pressed to the floor. Hold for at least 15 to 30 seconds.  4. Relax and lower the knee to the starting position. Repeat with the other leg.  5. Repeat 2 to 4 times with each leg.  6. To get more stretch, keep your other leg flat on the floor while pulling your knee to your chest.  Bridging    1. Lie on your back with both knees bent. Your knees should be bent about 90 degrees.  2. Tighten your belly muscles by pulling in your belly button toward your spine. Then push your feet into the floor, squeeze your buttocks, and lift your hips off the floor until your shoulders, hips, and knees are all in a straight line.  3. Hold for about 6 seconds as you continue to breathe normally, and then slowly lower your hips back down to the floor and rest for up to 10 seconds.  4. Repeat 8 to 12 times.  Hip extension    1. Get down on your hands and knees on the floor.  2. Keeping your back and neck straight, lift one leg straight out behind you. When you lift your leg, keep your hips level. Don't let your back twist, and don't let your hip drop toward the floor.  3. Hold for 6 seconds. Repeat 8 to 12 times with each leg.   4. If you feel steady and strong when you do this exercise, you can make it more difficult. To do this, when you lift your leg, also lift the opposite arm straight out in front of you. For example, lift the left leg and the right arm at the same time. (This is sometimes called the "bird dog exercise.") Hold for 6 seconds, and repeat 8 to 12 times on each side.  Clamshell    1. Lie on your side with a pillow under your head. Keep your feet and knees together and your knees bent.  2. Raise your top knee, but keep your feet together. Do not let your hips roll back. Your legs should open up like a clamshell.  3. Hold for 6 seconds.  4. Slowly lower your knee back down. Rest for 10 seconds.  5. Repeat 8 to 12 times.  6. Switch to your other side and repeat steps 1 through 5.  Hamstring wall stretch    1. Lie on your back in a doorway, with one leg through the open door.  2. Slide your affected leg up the wall to straighten your knee. You should feel a gentle stretch down the back of your leg.  ?? Do not arch your back.  ?? Do not bend   either knee.  ?? Keep one heel touching the floor and the other heel touching the wall. Do not point your toes.  3. Hold the stretch for at least 1 minute to begin. Then try to lengthen the time you hold the stretch to as long as 6 minutes.  4. Switch legs, and repeat steps 1 through 3.  5. Repeat 2 to 4 times.  If you do not have a place to do this exercise in a doorway, there is another way to do it:  1. Lie on your back, and bend one knee.  2. Loop a towel under the ball and toes of that foot, and hold the ends of the towel in your hands.  3. Straighten your knee, and slowly pull back on the towel. You should feel a gentle stretch down the back of your leg.  4. Switch legs, and repeat steps 1 through 3.  5. Repeat 2 to 4 times.  Lower abdominal strengthening    1. Lie on your back with your knees bent and your feet flat on the floor.   2. Tighten your belly muscles by pulling your belly button in toward your spine.  3. Lift one foot off the floor and bring your knee toward your chest, so that your knee is straight above your hip and your leg is bent like the letter "L."  4. Lift the other knee up to the same position.  5. Lower one leg at a time to the starting position.  6. Keep alternating legs until you have lifted each leg 8 to 12 times.  7. Be sure to keep your belly muscles tight and your back still as you are moving your legs. Be sure to breathe normally.  Piriformis stretch    1. Lie on your back with your legs straight.  2. Lift your affected leg, and bend your knee. With your opposite hand, reach across your body, and then gently pull your knee toward your opposite shoulder.  3. Hold the stretch for 15 to 30 seconds.  4. Switch legs and repeat steps 1 through 3.  5. Repeat 2 to 4 times.  Follow-up care is a key part of your treatment and safety. Be sure to make and go to all appointments, and call your doctor if you are having problems. It's also a good idea to know your test results and keep a list of the medicines you take.  Where can you learn more?  Go to http://www.healthwise.net/GoodHelp[facility]ctions.  Enter T635 in the search box to learn more about "Sacroiliac Pain: Exercises."  Current as of: Nov 03, 2014  Content Version: 11.1  ?? 2006-2016 [employer]. Care instructions adapted under license by [employer] (which disclaims liability or warranty for this information). If you have questions about a medical condition or this instruction, always ask your healthcare professional. Healthwise, Incorporated disclaims any warranty or liability for your use of this information.

## 2015-07-27 MED ORDER — AMLODIPINE 10 MG TAB
10 mg | ORAL_TABLET | Freq: Every day | ORAL | 1 refills | Status: DC
Start: 2015-07-27 — End: 2015-12-16

## 2015-07-27 MED ORDER — METOPROLOL TARTRATE 25 MG TAB
25 mg | ORAL_TABLET | Freq: Two times a day (BID) | ORAL | 1 refills | Status: DC
Start: 2015-07-27 — End: 2015-07-31

## 2015-07-27 MED ORDER — CLONIDINE 0.3 MG TAB
0.3 mg | ORAL_TABLET | Freq: Two times a day (BID) | ORAL | 1 refills | Status: DC
Start: 2015-07-27 — End: 2015-10-23

## 2015-07-28 NOTE — Telephone Encounter (Signed)
Pt is checking on a referral and medication refill.

## 2015-07-29 ENCOUNTER — Encounter

## 2015-07-29 MED ORDER — TRAZODONE 50 MG TAB
50 mg | ORAL_TABLET | Freq: Every evening | ORAL | 2 refills | Status: DC
Start: 2015-07-29 — End: 2015-08-28

## 2015-07-29 NOTE — Telephone Encounter (Signed)
Pt request a referral for her sleep apnea.

## 2015-07-31 ENCOUNTER — Encounter

## 2015-07-31 ENCOUNTER — Ambulatory Visit: Admit: 2015-07-31 | Discharge: 2015-07-31 | Payer: MEDICARE | Attending: Family Medicine | Primary: Physician Assistant

## 2015-07-31 DIAGNOSIS — I10 Essential (primary) hypertension: Secondary | ICD-10-CM

## 2015-07-31 MED ORDER — LISINOPRIL 10 MG TAB
10 mg | ORAL_TABLET | Freq: Every day | ORAL | 2 refills | Status: DC
Start: 2015-07-31 — End: 2015-07-31

## 2015-07-31 MED ORDER — POTASSIUM CHLORIDE SR 20 MEQ TAB, PARTICLES/CRYSTALS
20 mEq | ORAL_TABLET | Freq: Every day | ORAL | 2 refills | Status: DC
Start: 2015-07-31 — End: 2015-07-31

## 2015-07-31 MED ORDER — POTASSIUM CHLORIDE SR 20 MEQ TAB, PARTICLES/CRYSTALS
20 mEq | ORAL_TABLET | Freq: Every day | ORAL | 1 refills | Status: DC
Start: 2015-07-31 — End: 2015-11-17

## 2015-07-31 MED ORDER — KETOROLAC TROMETHAMINE 60 MG/2 ML IM
60 mg/2 mL | Freq: Once | INTRAMUSCULAR | 0 refills | Status: AC
Start: 2015-07-31 — End: 2015-07-31

## 2015-07-31 MED ORDER — LISINOPRIL 10 MG TAB
10 mg | ORAL_TABLET | Freq: Every day | ORAL | 1 refills | Status: DC
Start: 2015-07-31 — End: 2015-08-28

## 2015-07-31 MED ORDER — MAGNESIUM OXIDE 400 MG TAB
400 mg | ORAL_TABLET | Freq: Every day | ORAL | 2 refills | Status: DC
Start: 2015-07-31 — End: 2015-10-19

## 2015-07-31 NOTE — Progress Notes (Signed)
Hip Pain and Cough (chest congestion)        HPI: [name] [name] is a 65 y.o. female [employer]  Here in follow up of her hypertension.  She is accompanied by her husband.  She reports she is using her blood pressure medications as prescribed. She has brought in her bottles She continues to feel short of breath both at rest and with activity. She has newly diagnosed COPD and has follow up with pulmonologist this afternoon. She was seen in ER on 07/23/2015 for diarrhea and back pain and her blood pressure was 178/121 mmHg.    Her biggest concern today is her continued hip pain after hip replacement. She was previously on Oxycodone with Dr Salome Arnt but he is no longer with [facility].  She has not followed up with his office since he left in December. 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        Past Medical History   Diagnosis Date   ??? Arthritis    ??? Chronic pain    ??? Diabetes ([place])    ??? GERD (gastroesophageal reflux disease)    ??? Hypertension    ??? Liver disease      HEP C   ??? PTSD (post-traumatic stress disorder)      lived through the [place] in 1993       Current Outpatient Prescriptions   Medication Sig   ??? lisinopril (PRINIVIL, ZESTRIL) 5 mg tablet Take 5 mg by mouth daily. Indications: hypertension   ??? traZODone (DESYREL) 50 mg tablet Take 1 Tab by mouth nightly.   ??? cloNIDine HCl (CATAPRES) 0.3 mg tablet Take 1 Tab by mouth two (2) times a day for 30 days.   ??? amLODIPine (NORVASC) 10 mg tablet Take 1 Tab by mouth daily.   ??? metoprolol tartrate (LOPRESSOR) 25 mg tablet Take 1 Tab by mouth every twelve (12) hours for 30 days.   ??? methocarbamol (ROBAXIN) 750 mg tablet Take 1 Tab by mouth three (3) times daily.   ??? Blood-Glucose Meter monitoring kit Check sugars 2 times per day, fasting and 2 hours after dinner   ??? Lancets misc Check sugars twice daily   ??? glucose blood VI test strips (ASCENSIA AUTODISC VI, ONE TOUCH ULTRA TEST VI) strip Check sugars twice daily    ??? oxyCODONE-acetaminophen (PERCOCET) 5-325 mg per tablet Take 1 Tab by mouth every twelve (12) hours as needed for Pain. Max Daily Amount: 2 Tabs.   ??? metFORMIN (GLUCOPHAGE) 500 mg tablet Take 2 Tabs by mouth two (2) times daily (with meals) for 30 days. Indications: type 2 diabetes mellitus   ??? albuterol (PROVENTIL HFA, VENTOLIN HFA, PROAIR HFA) 90 mcg/actuation inhaler Take 2 Puffs by inhalation every six (6) hours as needed for Wheezing or Shortness of Breath.   ??? SEROQUEL XR 150 mg sr tablet TAKE 1 TABLET BY MOUTH NIGHTLY   ??? diclofenac EC (VOLTAREN) 75 mg EC tablet Take 1 Tab by mouth two (2) times daily as needed.   ??? promethazine (PHENERGAN) 25 mg tablet Take 1 Tab by mouth every eight (8) hours as needed.     No current facility-administered medications for this visit.        Allergies   Allergen Reactions   ??? Chocolate [Cocoa] Sneezing       Past Surgical History   Procedure Laterality Date   ??? Hx gyn  2010     hysterectomy   ??? Hx  endoscopy     ??? 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 Hx hip replacement         Family History   Problem Relation Age of Onset   ??? Hypertension Mother    ??? Cancer Maternal Aunt    ??? Alcohol abuse Maternal Grandfather        Social History     Social History   ??? Marital status: MARRIED     Spouse name: N/A   ??? Number of children: N/A   ??? Years of education: N/A     Occupational History   ??? Not on file.     Social History Main Topics   ??? Smoking status: Never Smoker   ??? Smokeless tobacco: Never Used   ??? Alcohol use No   ??? Drug use: No   ??? Sexual activity: No     Other Topics Concern   ??? Not on file     Social History Narrative       Gen- No weight loss, No Malaise, No fatigue  Eyes- No dipoplia, blurry vision  CVS- No Chest pain, no palpitations, no edema  Resp- No Cough, SOB, DOE, Wheezing  Neuro- No headaches  Skin- No easy bruising, No rashes     Visit Vitals   ??? BP (!) 150/111 (BP 1 Location: Right arm, BP Patient Position: Sitting)   ??? Pulse 68    ??? Temp 98.1 ??F (36.7 ??C) (Oral)   ??? Resp 22   ??? Ht _0  (1.702 m)   ??? Wt 191 lb 6.4 oz (86.8 kg)   ??? SpO2 98%   ??? BMI 29.98 kg/m2       GEN- NAD, AAOx3  CVS- RRR, +S1, +S2, No Murmurs, No JVD  PULM- CTABL, No W/R/R  EXT- No edema  NEURO-Normal Gait  PSYCH- Euthymic, normal afffect        Assesment:  1. Essential hypertension  Stop metoprolol as it may be causing feelings of SOB due to COPD.  Increase lisinopril to 54m. Continue amlodipine.      2. Right hip pain  Please follow up with Dr AMarlena Clipperoffice.  - ketorolac tromethamine (TORADOL) 60 mg/2 mL soln; 2 mL by IntraMUSCular route once for 1 dose.  Dispense: 2 mL; Refill: 0  - KETOROLAC TROMETHAMINE INJ (Qty 4)  - THER/PROPH/DIAG INJECTION, SUBCUT/IM    3. Hypokalemia  Start Klor con 20 mEq daily  - magnesium oxide (MAG-OX) 400 mg tablet; Take 1 Tab by mouth daily.  Dispense: 30 Tab; Refill: 2    4. Chronic obstructive pulmonary disease, unspecified COPD type ([place]  Follow up with Dr BDoylene Canardoffice      I have discussed the diagnosis with the patient and the intended management  The patient has received an after-visit summary and questions were answered concerning future plans.  I have discussed medication usage, side effects and warnings with the patient as well.I have reviewed the plan of care with the patient, accepted their input and they are in agreement with the treatment goals.         Follow-up Disposition:  Return in about 1 week (around 08/07/2015) for Nurse visit for labs and blood pressure.      JLake Bells MD                .

## 2015-07-31 NOTE — Telephone Encounter (Signed)
[facility] is requesting 90 days supply.

## 2015-07-31 NOTE — Telephone Encounter (Signed)
Patient in today for office visit.

## 2015-07-31 NOTE — Patient Instructions (Addendum)
Stop metoprolol    Recheck blood tests in 1 week.    Check blood pressures at home    Use Aleve for headaches.         DASH Diet: Care Instructions  Your Care Instructions  The DASH diet is an eating plan that can help lower your blood pressure. DASH stands for Dietary Approaches to Stop Hypertension. Hypertension is high blood pressure.  The DASH diet focuses on eating foods that are high in calcium, potassium, and magnesium. These nutrients can lower blood pressure. The foods that are highest in these nutrients are fruits, vegetables, low-fat dairy products, nuts, seeds, and legumes. But taking calcium, potassium, and magnesium supplements instead of eating foods that are high in those nutrients does not have the same effect. The DASH diet also includes whole grains, fish, and poultry.  The DASH diet is one of several lifestyle changes your doctor may recommend to lower your high blood pressure. Your doctor may also want you to decrease the amount of sodium in your diet. Lowering sodium while following the DASH diet can lower blood pressure even further than just the DASH diet alone.  Follow-up care is a key part of your treatment and safety. Be sure to make and go to all appointments, and call your doctor if you are having problems. It's also a good idea to know your test results and keep a list of the medicines you take.  How can you care for yourself at home?  Following the DASH diet  ?? Eat 4 to 5 servings of fruit each day. A serving is 1 medium-sized piece of fruit, ?? cup chopped or canned fruit, 1/4 cup dried fruit, or 4 ounces (?? cup) of fruit juice. Choose fruit more often than fruit juice.  ?? Eat 4 to 5 servings of vegetables each day. A serving is 1 cup of lettuce or raw leafy vegetables, ?? cup of chopped or cooked vegetables, or 4 ounces (?? cup) of vegetable juice. Choose vegetables more often than vegetable juice.  ?? Get 2 to 3 servings of low-fat and fat-free dairy each day. A serving is  8 ounces of milk, 1 cup of yogurt, or 1 ?? ounces of cheese.  ?? Eat 6 to 8 servings of grains each day. A serving is 1 slice of bread, 1 ounce of dry cereal, or ?? cup of cooked rice, pasta, or cooked cereal. Try to choose whole-grain products as much as possible.  ?? Limit lean meat, poultry, and fish to 2 servings each day. A serving is 3 ounces, about the size of a deck of cards.  ?? Eat 4 to 5 servings of nuts, seeds, and legumes (cooked dried beans, lentils, and split peas) each week. A serving is 1/3 cup of nuts, 2 tablespoons of seeds, or ?? cup of cooked beans or peas.  ?? Limit fats and oils to 2 to 3 servings each day. A serving is 1 teaspoon of vegetable oil or 2 tablespoons of salad dressing.  ?? Limit sweets and added sugars to 5 servings or less a week. A serving is 1 tablespoon jelly or jam, ?? cup sorbet, or 1 cup of lemonade.  ?? Eat less than 2,300 milligrams (mg) of sodium a day. If you limit your sodium to 1,500 mg a day, you can lower your blood pressure even more.  Tips for success  ?? Start small. Do not try to make dramatic changes to your diet all at once. You might feel that  you are missing out on your favorite foods and then be more likely to not follow the plan. Make small changes, and stick with them. Once those changes become habit, add a few more changes.  ?? Try some of the following:  ?? Make it a goal to eat a fruit or vegetable at every meal and at snacks. This will make it easy to get the recommended amount of fruits and vegetables each day.  ?? Try yogurt topped with fruit and nuts for a snack or healthy dessert.  ?? Add lettuce, tomato, cucumber, and onion to sandwiches.  ?? Combine a ready-made pizza crust with low-fat mozzarella cheese and lots of vegetable toppings. Try using tomatoes, squash, spinach, broccoli, carrots, cauliflower, and onions.  ?? Have a variety of cut-up vegetables with a low-fat dip as an appetizer instead of chips and dip.   ?? Sprinkle sunflower seeds or chopped almonds over salads. Or try adding chopped walnuts or almonds to cooked vegetables.  ?? Try some vegetarian meals using beans and peas. Add garbanzo or kidney beans to salads. Make burritos and tacos with mashed pinto beans or black beans.  Where can you learn more?  Go to [employer].  Enter [phone] in the search box to learn more about "DASH Diet: Care Instructions."  Current as of: September 03, 2014  Content Version: 11.1  ?? 2006-2016 [employer], [employer]. Care instructions adapted under license by [employer] (which disclaims liability or warranty for this information). If you have questions about a medical condition or this instruction, always ask your healthcare professional. [employer] disclaims any warranty or liability for your use of this information.

## 2015-08-06 ENCOUNTER — Ambulatory Visit: Admit: 2015-08-06 | Payer: MEDICARE | Primary: Physician Assistant

## 2015-08-06 ENCOUNTER — Inpatient Hospital Stay: Payer: MEDICARE

## 2015-08-06 LAB — GLUCOSE, POC: Glucose (POC): 121 mg/dL — ABNORMAL HIGH (ref 70–110)

## 2015-08-06 MED ORDER — IOPAMIDOL 41 % INTRATHECAL
200 mg iodine /mL (41 %) | INTRATHECAL | Status: AC
Start: 2015-08-06 — End: ?

## 2015-08-06 MED ORDER — DEXAMETHASONE SODIUM PHOSPHATE 10 MG/ML IJ SOLN
10 mg/mL | INTRAMUSCULAR | Status: DC | PRN
Start: 2015-08-06 — End: 2015-08-06
  Administered 2015-08-06: 18:00:00

## 2015-08-06 MED ORDER — SODIUM CHLORIDE 0.9 % IJ SYRG
INTRAMUSCULAR | Status: DC | PRN
Start: 2015-08-06 — End: 2015-08-06

## 2015-08-06 MED ORDER — DIAZEPAM 5 MG TAB
5 mg | Freq: Once | ORAL | Status: AC
Start: 2015-08-06 — End: 2015-08-06
  Administered 2015-08-06: 17:00:00 via ORAL

## 2015-08-06 MED ORDER — DEXAMETHASONE SODIUM PHOSPHATE (PF) 10 MG/ML INJECTION
10 mg/mL | INTRAMUSCULAR | Status: AC
Start: 2015-08-06 — End: ?

## 2015-08-06 MED ORDER — LIDOCAINE (PF) 10 MG/ML (1 %) IJ SOLN
10 mg/mL (1 %) | INTRAMUSCULAR | Status: AC
Start: 2015-08-06 — End: ?

## 2015-08-06 MED ORDER — LIDOCAINE (PF) 10 MG/ML (1 %) IJ SOLN
10 mg/mL (1 %) | INTRAMUSCULAR | Status: DC | PRN
Start: 2015-08-06 — End: 2015-08-06
  Administered 2015-08-06: 18:00:00

## 2015-08-06 MED FILL — LIDOCAINE (PF) 10 MG/ML (1 %) IJ SOLN: 10 mg/mL (1 %) | INTRAMUSCULAR | Qty: 30

## 2015-08-06 MED FILL — BD POSIFLUSH NORMAL SALINE 0.9 % INJECTION SYRINGE: INTRAMUSCULAR | Qty: 10

## 2015-08-06 MED FILL — DIAZEPAM 5 MG TAB: 5 mg | ORAL | Qty: 2

## 2015-08-06 MED FILL — ISOVUE-M 200  41 % INTRATHECAL SOLUTION: 200 mg iodine /mL (41 %) | INTRATHECAL | Qty: 20

## 2015-08-06 MED FILL — DEXAMETHASONE SODIUM PHOSPHATE (PF) 10 MG/ML INJECTION: 10 mg/mL | INTRAMUSCULAR | Qty: 3

## 2015-08-06 NOTE — H&P (Signed)
H&P is on paper and was reviewed on 08/06/2015 before preforming the procedure.

## 2015-08-06 NOTE — Procedures (Signed)
Procedure Note    Patient Name: [name] [name]    Date of Procedure: August 06, 2015    Preoperative Diagnosis: Sacrilitis    Post Operative Diagnosis: same    Procedure: SI Joint Injection right     Consent: Informed consent was obtained prior to the procedure. The patient was given the opportunity to ask questions regarding the procedure and its associated risks. In addition to the potential risks associated with the procedure itself, the patient was informed both verbally and in writing of potential side effects of the use of glucocorticoids. The patient appeared to comprehend the informed consent and desired to have the procedure perfored.    Procedure: The patient was placed in the prone position on the flouroscopy table and the back was prepped and draped in the usual sterile manner. A #22 gauge spinal needle was then advanced to lie within the SI joint after local Lidocaine 1% injection. A total of 30 mg of preservative free Dexamethasone and 4 cc of Lidocaine was introduced into the SI joint.     The injection area was cleaned and bandaids applied. No excessive bleeding was noted. Patient dressed and was discharged to home with instructions.    Discussion: The patient tolerated the procedure well.     Marques Ericson L Marvis Bakken, MD  August 06, 2015

## 2015-08-06 NOTE — Procedures (Signed)
Procedure Note    Patient Name: [name] [name]    Date of Procedure: August 06, 2015    Preoperative Diagnosis: Sacrilitis    Post Operative Diagnosis: same    Procedure: SI Joint Injection right     Consent: Informed consent was obtained prior to the procedure. The patient was given the opportunity to ask questions regarding the procedure and its associated risks. In addition to the potential risks associated with the procedure itself, the patient was informed both verbally and in writing of potential side effects of the use of glucocorticoids. The patient appeared to comprehend the informed consent and desired to have the procedure perfored.    Procedure: The patient was placed in the prone position on the flouroscopy table and the back was prepped and draped in the usual sterile manner. A #22 gauge spinal needle was then advanced to lie within the SI joint after local Lidocaine 1% injection. A total of 30 mg of preservative free Dexamethasone and 4 cc of Lidocaine was introduced into the SI joint.     The injection area was cleaned and bandaids applied. No excessive bleeding was noted. Patient dressed and was discharged to home with instructions.    Discussion: The patient tolerated the procedure well.     Andria Meuse, MD  August 06, 2015

## 2015-08-18 NOTE — Telephone Encounter (Signed)
Refill for Metformin 500mg , was prescribed in the Hospital.

## 2015-08-19 ENCOUNTER — Emergency Department: Admit: 2015-08-20 | Payer: MEDICARE | Primary: Physician Assistant

## 2015-08-19 ENCOUNTER — Inpatient Hospital Stay
Admit: 2015-08-19 | Discharge: 2015-08-20 | Disposition: A | Discharge status: Home / Self Care | Payer: MEDICARE | Attending: Emergency Medicine

## 2015-08-19 DIAGNOSIS — J441 Chronic obstructive pulmonary disease with (acute) exacerbation: Secondary | ICD-10-CM

## 2015-08-19 MED ORDER — IPRATROPIUM-ALBUTEROL 2.5 MG-0.5 MG/3 ML NEB SOLUTION
2.5 mg-0.5 mg/3 ml | RESPIRATORY_TRACT | Status: AC
Start: 2015-08-19 — End: 2015-08-19
  Administered 2015-08-19: 23:00:00 via RESPIRATORY_TRACT

## 2015-08-19 MED FILL — IPRATROPIUM-ALBUTEROL 2.5 MG-0.5 MG/3 ML NEB SOLUTION: 2.5 mg-0.5 mg/3 ml | RESPIRATORY_TRACT | Qty: 3

## 2015-08-19 NOTE — ED Triage Notes (Signed)
C/o shortness of breath since last night; coughing x 2 days

## 2015-08-19 NOTE — Other (Signed)
HHN completed by R.N.

## 2015-08-19 NOTE — ED Provider Notes (Signed)
[facility]  Emergency Department Treatment Report    Patient: [name] [name] Age: 65 y.o. Sex: female    Date of Birth: 11-Jul-1950 Admit Date: 08/19/2015 PCP: Lake Bells, MD   MRN: [id number]  CSN: [id number]     Room: OTF/OTF Time Dictated: 12:27 AM        Chief Complaint   Cough and wheezing, right toe pain  History of Present Illness   65 y.o. female with stated history of COPD, here today for evaluation of cough, wheezing, ongoing over 48 hours. She has a handheld inhaler at home, which helps a little bit, but it did not totally go away. She denies fever, chills, has no nausea or vomiting. She denies shortness of breath with exertion, only shortness breath when she has coughing spells. Denies swelling in her legs, denies chest pain, has no inexplicable weight gain or weight loss. She has no hemoptysis. She has no pain with inspiration. She relates her COPD to exposure when she was in a [place] at 9/11. This feels similar to all of her prior exacerbations.    She also complains of right great toe pain, no trauma, no redness, no swelling, just feels like somebody "hit it"    Review of Systems   Review of Systems   Constitutional: Negative for chills, diaphoresis, fever and weight loss.   HENT: Negative for congestion and sore throat.    Eyes: Negative for blurred vision and double vision.   Respiratory: Positive for cough and wheezing. Negative for sputum production and shortness of breath.    Cardiovascular: Negative for chest pain, palpitations, orthopnea and leg swelling.   Gastrointestinal: Negative for abdominal pain, blood in stool, diarrhea, melena, nausea and vomiting.   Genitourinary: Negative for dysuria, frequency and urgency.   Musculoskeletal: Negative for back pain and falls.   Skin: Negative for rash.   Neurological: Negative for headaches.   Endo/Heme/Allergies: Does not bruise/bleed easily.   Psychiatric/Behavioral: Negative for depression and suicidal ideas.        Past Medical/Surgical History     Past Medical History:   Diagnosis Date   ??? Arthritis    ??? Chronic pain    ??? Diabetes ([place])    ??? GERD (gastroesophageal reflux disease)    ??? Hypertension    ??? Liver disease     HEP C   ??? PTSD (post-traumatic stress disorder)     lived through the [place] in 1993     Past Surgical History:   Procedure Laterality Date   ??? HX ENDOSCOPY     ??? HX GYN  2010    hysterectomy   ??? HX HIP REPLACEMENT Right 01/29/2015   ??? HX HIP REPLACEMENT         Social History     Social History     Social History   ??? Marital status: MARRIED     Spouse name: N/A   ??? Number of children: N/A   ??? Years of education: N/A     Social History Main Topics   ??? Smoking status: Never Smoker   ??? Smokeless tobacco: Never Used   ??? Alcohol use No   ??? Drug use: No   ??? Sexual activity: No     Other Topics Concern   ??? None     Social History Narrative       Family History     Family History   Problem Relation Age of Onset   ???  Hypertension Mother    ??? Cancer Maternal Aunt    ??? Alcohol abuse Maternal Grandfather        Current Medications     Current Outpatient Prescriptions   Medication Sig Dispense Refill   ??? predniSONE (DELTASONE) 50 mg tablet Take 1 Tab by mouth daily for 3 days. 3 Tab 0   ??? magnesium oxide (MAG-OX) 400 mg tablet Take 1 Tab by mouth daily. 30 Tab 2   ??? lisinopril (PRINIVIL, ZESTRIL) 10 mg tablet Take 1 Tab by mouth daily. 90 Tab 1   ??? potassium chloride (KLOR-CON M20) 20 mEq tablet Take 1 Tab by mouth daily. 90 Tab 1   ??? traZODone (DESYREL) 50 mg tablet Take 1 Tab by mouth nightly. 30 Tab 2   ??? cloNIDine HCl (CATAPRES) 0.3 mg tablet Take 1 Tab by mouth two (2) times a day for 30 days. 60 Tab 1   ??? amLODIPine (NORVASC) 10 mg tablet Take 1 Tab by mouth daily. 30 Tab 1   ??? methocarbamol (ROBAXIN) 750 mg tablet Take 1 Tab by mouth three (3) times daily. 20 Tab 0   ??? Blood-Glucose Meter monitoring kit Check sugars 2 times per day, fasting and 2 hours after dinner 1 Kit 0    ??? glucose blood VI test strips (ASCENSIA AUTODISC VI, ONE TOUCH ULTRA TEST VI) strip Check sugars twice daily 100 Strip 5   ??? albuterol (PROVENTIL HFA, VENTOLIN HFA, PROAIR HFA) 90 mcg/actuation inhaler Take 2 Puffs by inhalation every six (6) hours as needed for Wheezing or Shortness of Breath. 1 Inhaler 0   ??? SEROQUEL XR 150 mg sr tablet TAKE 1 TABLET BY MOUTH NIGHTLY 30 Tab 2   ??? Lancets misc Check sugars twice daily 200 Each 2   ??? oxyCODONE-acetaminophen (PERCOCET) 5-325 mg per tablet Take 1 Tab by mouth every twelve (12) hours as needed for Pain. Max Daily Amount: 2 Tabs. 40 Tab 0       Allergies     Allergies   Allergen Reactions   ??? Chocolate [Cocoa] Sneezing       Physical Exam     Visit Vitals   ??? BP (!) 170/118 (BP 1 Location: Left arm, BP Patient Position: At rest)   ??? Pulse (!) 111   ??? Temp 99.3 ??F (37.4 ??C)   ??? Resp 20   ??? Ht 5' 6" (1.676 m)   ??? Wt 81.6 kg (180 lb)   ??? SpO2 99%   ??? BMI 29.05 kg/m2     Physical Exam   Constitutional: She is oriented to person, place, and time.   Mildly tachycardic   HENT:   Head: Normocephalic and atraumatic.   Eyes: Conjunctivae are normal. Pupils are equal, round, and reactive to light. Right eye exhibits no discharge. Left eye exhibits no discharge.   Neck: Normal range of motion. Neck supple. No tracheal deviation present.   Cardiovascular: Normal rate, regular rhythm, normal heart sounds and intact distal pulses.  Exam reveals no gallop and no friction rub.    No murmur heard.  Pulmonary/Chest: Effort normal. No stridor. No respiratory distress. She has wheezes. She has no rales.   Abdominal: Soft. Bowel sounds are normal. She exhibits no distension and no mass. There is no tenderness. There is no rebound and no guarding.   Musculoskeletal: Normal range of motion. She exhibits no edema or deformity.   Right great toe minimally tender to palpation, strong capillary refill, no swelling, no erythema, no deformity,  no evidence of trauma.    Neurological: She is alert and oriented to person, place, and time. Gait normal. GCS score is 15.   Skin: Skin is warm and dry. No rash noted. No erythema. No pallor.   Psychiatric: Affect normal.       Impression and Management Plan   65 year old female with history of COPD here today for evaluation of cough, wheezing, shortness of breath. On examination she has mild to moderate wheezing on examination. Plan will be oral steroids, nebulizer therapy, and reassess. She has no evidence of trauma or concerning inflammation to the toe, I do not feel imaging is warranted regards to this.   Plan will be plain, the chest rule out acute infiltrate, reassess after nebulizer therapy.   Diagnostic Studies   Lab:   No results found for this or any previous visit (from the past 12 hour(s)).    Imaging:    Xr Chest Pa Lat    Result Date: 08/19/2015  PA and lateral chest. Comparison 07/23/2015. HISTORY: Shortness of breath with cough. FINDINGS: Scarring identified in the lingula with a 2 cm nodule left lower lobe similar to previous examination. This has been worked up with previous CT scan. Cardiac silhouette is normal. Remaining lungs clear.     IMPRESSION: Chronic changes in the left base. 2 cm nodule in the left lower lobe similar to previous examination.       Medical Decision Making/ED Course   Chest x-ray only with evidence of chronic changes, no acute findings. After nebulizer therapy patient feels "great", lung examination shows clear lungs. Given her symptomatically improvement here, do not feel she requires hospitalization. We'll discharge him home with a steroid burst. Advised return to the ER for worsening symptoms, fever, productive cough, or any concerning issues.   Final Diagnosis       ICD-10-CM ICD-9-CM   1. COPD exacerbation ([place]) J44.1 491.21   2. Great toe pain, right M79.674 729.5     Disposition   Discharged home     Mora Bellman, MD  August 20, 2015     My signature above authenticates this document and my orders, the final ??  diagnosis (es), discharge prescription (s), and instructions in the Epic ??  record.  If you have any questions please contact [phone].  ??  Nursing notes have been reviewed by the physician/ advanced practice ??  Clinician.

## 2015-08-19 NOTE — ED Notes (Signed)
Bedside and Verbal shift change report given to L. Bunting LPN (Cabin crewoncoming nurse). Report included the following information SBAR, ED Summary and MAR.     Lance MussSadie Abbott, RN

## 2015-08-20 ENCOUNTER — Encounter

## 2015-08-20 MED ORDER — METFORMIN 500 MG TAB
500 mg | ORAL_TABLET | Freq: Two times a day (BID) | ORAL | 2 refills | Status: DC
Start: 2015-08-20 — End: 2015-10-06

## 2015-08-20 MED ORDER — PREDNISONE 20 MG TAB
20 mg | ORAL | Status: AC
Start: 2015-08-20 — End: 2015-08-19
  Administered 2015-08-20: via ORAL

## 2015-08-20 MED ORDER — PREDNISONE 50 MG TAB
50 mg | ORAL_TABLET | Freq: Every day | ORAL | 0 refills | Status: AC
Start: 2015-08-20 — End: 2015-08-22

## 2015-08-20 MED ORDER — IPRATROPIUM-ALBUTEROL 2.5 MG-0.5 MG/3 ML NEB SOLUTION
2.5 mg-0.5 mg/3 ml | RESPIRATORY_TRACT | Status: AC
Start: 2015-08-20 — End: 2015-08-19

## 2015-08-20 MED FILL — PREDNISONE 20 MG TAB: 20 mg | ORAL | Qty: 3

## 2015-08-20 NOTE — Progress Notes (Signed)
Spoke with patient, aware of need for repeat labs and BP check.

## 2015-08-20 NOTE — Progress Notes (Signed)
Pateint with new onset type 2 diabetes.  Started on metformin per hospital. Refill meds today. Needs follow up.    1. Type 2 diabetes mellitus with hyperglycemia, without long-term current use of insulin (HCC)    - metFORMIN (GLUCOPHAGE) 500 mg tablet; Take 2 Tabs by mouth two (2) times daily (with meals).  Dispense: 120 Tab; Refill: 2    2. Hypokalemia    - METABOLIC PANEL, BASIC; Future

## 2015-08-21 ENCOUNTER — Inpatient Hospital Stay: Admit: 2015-08-21 | Payer: MEDICARE | Primary: Physician Assistant

## 2015-08-21 ENCOUNTER — Institutional Professional Consult (permissible substitution)
Admit: 2015-08-21 | Discharge: 2015-08-21 | Payer: MEDICARE | Attending: Physician Assistant | Primary: Physician Assistant

## 2015-08-21 DIAGNOSIS — E876 Hypokalemia: Secondary | ICD-10-CM

## 2015-08-21 DIAGNOSIS — E1165 Type 2 diabetes mellitus with hyperglycemia: Secondary | ICD-10-CM

## 2015-08-21 NOTE — Progress Notes (Signed)
[name] ShutterMonica R Lasota is a 65 y.o. female here today for labs only. She c/o headache so her BP was checked. After reviewing her meds, patient states she has only been taking her clonidine once a day. She was advised to start taking it as directed which is BID. She is also to keep a log of her BP over the weekend and to follow up with Dr. Jac Canavanharakan early next week. She was advised to go to the ER if she developes worsening headaches, SOB, or chest pain. Patient is agreeable.

## 2015-08-21 NOTE — Progress Notes (Signed)
Patient aware of lab results.

## 2015-08-21 NOTE — Progress Notes (Signed)
Patient states her husband gives her her medications ever day so I will speak with him and go over her meds with him when he returns to the office to pick up patient.

## 2015-08-22 LAB — METABOLIC PANEL, BASIC
Anion gap: 11 mmol/L (ref 3.0–18)
BUN/Creatinine ratio: 19 (ref 12–20)
BUN: 14 MG/DL (ref 7.0–18)
CO2: 29 mmol/L (ref 21–32)
Calcium: 10 MG/DL (ref 8.5–10.1)
Chloride: 103 mmol/L (ref 100–108)
Creatinine: 0.73 MG/DL (ref 0.6–1.3)
GFR est AA: >60 mL/min/{1.73_m2} (ref >60)
GFR est non-AA: >60 mL/min/{1.73_m2} (ref >60)
Glucose: 95 mg/dL (ref 74–99)
Potassium: 3.6 mmol/L (ref 3.5–5.5)
Sodium: 143 mmol/L (ref 136–145)

## 2015-08-24 NOTE — Progress Notes (Signed)
Please let  [name] GlennMonica know that her potassium looked good.  Continue with the supplementation.    Thanks,  [name] K Parminder Trapani, MD

## 2015-08-26 ENCOUNTER — Encounter

## 2015-08-27 ENCOUNTER — Ambulatory Visit
Admit: 2015-08-27 | Discharge: 2015-08-27 | Payer: MEDICARE | Attending: Physical Medicine & Rehabilitation | Primary: Physician Assistant

## 2015-08-27 DIAGNOSIS — M461 Sacroiliitis, not elsewhere classified: Secondary | ICD-10-CM

## 2015-08-27 MED ORDER — DICLOFENAC 75 MG TAB, DELAYED RELEASE
75 mg | ORAL_TABLET | Freq: Two times a day (BID) | ORAL | 5 refills | Status: DC | PRN
Start: 2015-08-27 — End: 2016-07-23

## 2015-08-27 NOTE — Telephone Encounter (Signed)
Pt requests a call back.

## 2015-08-27 NOTE — Patient Instructions (Addendum)
MyChart Activation    Thank you for requesting access to MyChart. Please follow the instructions below to securely access and download your online medical record. MyChart allows you to send messages to your doctor, view your test results, renew your prescriptions, schedule appointments, and more.    How Do I Sign Up?    1. In your internet browser, go to [email or web address]  2. Click on the First Time User? Click Here link in the Sign In box. You will be redirect to the New Member Sign Up page.  3. Enter your MyChart Access Code exactly as it appears below. You will not need to use this code after you???ve completed the sign-up process. If you do not sign up before the expiration date, you must request a new code.    MyChart Access Code: [email or web address]  Expires: 10/21/2015  9:13 PM (This is the date your MyChart access code will expire)    4. Enter the last four digits of your Social Security Number (xxxx) and Date of Birth (mm/dd/yyyy) as indicated and click Submit. You will be taken to the next sign-up page.  5. Create a MyChart ID. This will be your MyChart login ID and cannot be changed, so think of one that is secure and easy to remember.  6. Create a MyChart password. You can change your password at any time.  7. Enter your Password Reset Question and Answer. This can be used at a later time if you forget your password.   8. Enter your e-mail address. You will receive e-mail notification when new information is available in MyChart.  9. Click Sign Up. You can now view and download portions of your medical record.  10. Click the Download Summary menu link to download a portable copy of your medical information.    Additional Information    If you have questions, please visit the Frequently Asked Questions section of the MyChart website at [email or web address] Remember, MyChart is NOT to be used for urgent needs. For medical emergencies, dial 911.          Learning About Medial Branch Block and Neurotomy  What are medial branch block and neurotomy?    Facet joints connect your vertebrae to each other. Problems in these joints can cause chronic (long-term) pain in the neck or back. They can sometimes affect the shoulders, arms, buttocks, or legs.  Medial branch nerves are the nerves that carry many of the pain messages from your facet joints.  Radiofrequency medial branch neurotomy is a type of medial branch neurotomy that is used to relieve arthritis pain. It uses radio waves to damage nerves in your neck or back so that they can no longer send pain messages to your brain.  Before your doctor knows if a neurotomy will help you, he or she will do a medial branch block to find out if certain nerves are the ones that are a source of your pain. You will need two separate visits to the outpatient center or hospital to have both procedures.  How is a medial branch block done?  The doctor will use a tiny needle to numb the skin where you will get the block. Then he or she puts the block needle into the numbed area. You may feel some pressure, but you should not feel pain. Using fluoroscopy (live X-ray) to guide the needle, the doctor injects medicine onto one or more nerves to make them numb.  If you get relief from your pain in the next  4 to 6 hours, it's a sign that those nerves may be contributing to your pain. The relief will last only a short time. You may then have a medial branch neurotomy at a later visit to try to get longer relief.  It takes 20 to 30 minutes to get the block. You can go home after the doctor watches you for about an hour. You will get instructions on how to report how much pain you have when you are at home.  You will need someone to drive you home.  How is medial branch neurotomy done?  The doctor will use a tiny needle to numb the skin where you will get the neurotomy. Then he or she puts the neurotomy needle into the numbed area.  You may feel some pressure. Using fluoroscopy (live X-ray) to guide the needle, the doctor sends radio waves through the needle to the nerve for 60 to 90 seconds. The radio waves heat the nerve, which damages it. The doctor may do this several times. And he or she may treat more than one nerve.  It takes 45 to 90 minutes to get a neurotomy, depending on how many nerves are heated. You will probably go home 30 to 60 minutes later.  You will need someone to drive you home.  What can you expect after a neurotomy?  You may feel a little sore or tender at the injection site at first. But after a successful neurotomy, most people have pain relief right away. It often lasts for 9 to 12 months or longer. Sometimes the pain relief is permanent.  If your pain does come back, it may mean that the damaged nerve has healed and can send pain messages again. Or it can mean that a different nerve is causing pain. Your doctor will discuss your options with you.  Follow-up care is a key part of your treatment and safety. Be sure to make and go to all appointments, and call your doctor if you are having problems. It's also a good idea to know your test results and keep a list of the medicines you take.  Where can you learn more?  Go to [employer]cahttp://www.healthwise.net/[facility].  Enter 503-690-9398T494 in the search box to learn more about "Learning About Medial Branch Block and Neurotomy."  Current as of: August 01, 2014  Content Version: 11.1  ?? 2006-2016 [employer]. Care instructions adapted under license by [facility] (which disclaims liability or warranty for this information). If you have questions about a medical condition or this instruction, always ask your healthcare professional. [employer] disclaims any warranty or liability for your use of this information.

## 2015-08-27 NOTE — Telephone Encounter (Signed)
Left message for patient to call back.

## 2015-08-27 NOTE — Progress Notes (Signed)
Patient in office today for f/u up visit with Dr. Tana [name]. Patient c/o cough with chest pains related to "cold". Cough, congestion, SOB noted 02 98% on room air. [name] elevated 140/98, on left arm manually taken. Md notified. Patient PCP office called, spoke with Nurse Laurelyn SickleKatina and informed her of patient's condition. Patient was informed to call PCP office to set up a time to see a provider and the doctor will not be in the office tomorrow but the NP is available. Due to the distance from this office to her PCP office the patient was not able to accept the 445 pm time slot offered by Nurse Laurelyn SickleKatina but she was informed of their urgent care option. Dr. Tana [name] updated.

## 2015-08-27 NOTE — Progress Notes (Signed)
[facility]  [address], [place] [address]  Phone: [phone]  Fax: [phone]        [name] [name], [name] [name]  DOB: [birth year]  PCP: Lake Bells, MD  08/27/2015    PROGRESS NOTE      ASSESSMENT AND PLAN    [name] [name] comes in to the office today for a right SI joint injection f/u. She found great relief with the injection for about a week before the pain returned.    She was referred for a right SI joint RFA to see if it will provide further relief. Pt was advised to speak to her PCP about her high BP. Pt was prescribed diclofenac 9m BID prn. The risks, benefits, and potential side effects of this medication were discussed.      Pt will f/u in 2 months or prn.      MLatyshawas seen today for back pain and follow-up.    Diagnoses and all orders for this visit:    Sacroiliitis ([place]  -     REFERRAL TO PAIN MANAGEMENT  -     diclofenac EC (VOLTAREN) 75 mg EC tablet; Take 1 Tab by mouth two (2) times daily as needed.         Follow-up Disposition:  Return in about 2 months (around 10/27/2015), or if symptoms worsen or fail to improve.      HISTORY OF PRESENT ILLNESS  [name] HYNSONis a 65y.o. female.    A&P / HPI from 07/24/2015:  [name] DECATURcomes in to the office today c/o 8/10 aching lumbar pain that radiates down the lateral portion of her right leg and ends at her knee x 2 years. She has also recently had a right hip replacement with Dr. ASalome Arntabout 6 months ago. Pt also has complaints of right foot numbness. Her pain appears to be due to sacroiliitis.  ??  She was referred for a right SI joint injection to see if it will provide relief. If it does not I may refer her for a lumbar MRI to further evaluate her potential radiculopathy. Pt was prescribed diclofenac 716mBID prn. The risks, benefits, and potential side effects of this medication were discussed.   ??  Pt will f/u in 3 weeks or prn.    Updates from 08/27/15:   Pt presents for a right SI joint injection f/u. She found great relief with the injection for about a week before the pain returned.      PAST MEDICAL HISTORY   Past Medical History:   Diagnosis Date   ??? Arthritis    ??? Chronic pain    ??? [place]   ??? GERD (gastroesophageal reflux disease)    ??? Hypertension    ??? Liver disease     HEP C   ??? PTSD (post-traumatic stress disorder)     [place]       Past Surgical History:   Procedure Laterality Date   ??? HX ENDOSCOPY     ??? HX GYN  2010    hysterectomy   ??? HX HIP REPLACEMENT Right 01/29/2015   ??? HX HIP REPLACEMENT     .      MEDICATIONS      Current Outpatient Prescriptions   Medication Sig Dispense Refill   ??? metFORMIN (GLUCOPHAGE) 500 mg tablet Take 2 Tabs by mouth two (2) times daily (  with meals). 120 Tab 2   ??? magnesium oxide (MAG-OX) 400 mg tablet Take 1 Tab by mouth daily. 30 Tab 2   ??? lisinopril (PRINIVIL, ZESTRIL) 10 mg tablet Take 1 Tab by mouth daily. 90 Tab 1   ??? potassium chloride (KLOR-CON M20) 20 mEq tablet Take 1 Tab by mouth daily. 90 Tab 1   ??? amLODIPine (NORVASC) 10 mg tablet Take 1 Tab by mouth daily. 30 Tab 1   ??? Blood-Glucose Meter monitoring kit Check sugars 2 times per day, fasting and 2 hours after dinner 1 Kit 0   ??? Lancets misc Check sugars twice daily 200 Each 2   ??? glucose blood VI test strips (ASCENSIA AUTODISC VI, ONE TOUCH ULTRA TEST VI) strip Check sugars twice daily 100 Strip 5   ??? albuterol (PROVENTIL HFA, VENTOLIN HFA, PROAIR HFA) 90 mcg/actuation inhaler Take 2 Puffs by inhalation every six (6) hours as needed for Wheezing or Shortness of Breath. 1 Inhaler 0   ??? traZODone (DESYREL) 50 mg tablet Take 1 Tab by mouth nightly. 30 Tab 2   ??? methocarbamol (ROBAXIN) 750 mg tablet Take 1 Tab by mouth three (3) times daily. 20 Tab 0   ??? oxyCODONE-acetaminophen (PERCOCET) 5-325 mg per tablet Take 1 Tab by mouth every twelve (12) hours as needed for Pain. Max Daily Amount: 2 Tabs. 40 Tab 0    ??? SEROQUEL XR 150 mg sr tablet TAKE 1 TABLET BY MOUTH NIGHTLY 30 Tab 2        ALLERGIES    Allergies   Allergen Reactions   ??? Chocolate [Cocoa] Sneezing          SOCIAL HISTORY    Social History     Social History   ??? Marital status: MARRIED     Spouse name: N/A   ??? Number of children: N/A   ??? Years of education: N/A     Social History Main Topics   ??? Smoking status: Never Smoker   ??? Smokeless tobacco: Never Used   ??? Alcohol use No   ??? Drug use: No   ??? Sexual activity: No     Other Topics Concern   ??? None     Social History Narrative     Social History Narrative      Problem Relation Age of Onset   ??? Hypertension Mother    ??? Cancer Maternal Aunt    ??? Alcohol abuse Maternal Grandfather          REVIEW OF SYSTEMS  Review of Systems   Constitutional: Negative for chills, diaphoresis, fever, malaise/fatigue and weight loss.   Respiratory: Negative for shortness of breath.    Cardiovascular: Negative for chest pain and leg swelling.   Gastrointestinal: Negative for constipation, nausea and vomiting.   Neurological: Negative for dizziness, tingling, seizures, loss of consciousness and headaches.   Psychiatric/Behavioral: The patient does not have insomnia.           PHYSICAL EXAMINATION  Visit Vitals   ??? BP (!) 140/98 (BP 1 Location: Left arm, BP Patient Position: Sitting)  Comment: manual   ??? Pulse 91   ??? Temp 98.3 ??F (36.8 ??C) (Oral)   ??? Resp 24  Comment: cough and congestion noted, pt not feeling well, SOB   ??? Ht _0  (1.676 m)   ??? Wt 192 lb (87.1 kg)   ??? SpO2 98%   ??? BMI 30.99 kg/m2       Pain Assessment  08/27/2015  Location of Pain Back;Leg   Location Modifiers Right   Severity of Pain 7   Quality of Pain Throbbing;Sharp;Dull;Aching;Burning   Duration of Pain -   Frequency of Pain Constant   Aggravating Factors -   Aggravating Factors Comment -   Limiting Behavior -   Relieving Factors -   Result of Injury No           Constitutional:  Well developed, well nourished, in no acute distress.    Psychiatric: Affect and mood are appropriate.   Integumentary: No rashes or abrasions noted on exposed areas.        SPINE/MUSCULOSKELETAL EXAM    Cervical spine:  Neck is midline.   Normal muscle tone.   No focal atrophy is noted.   ROM pain free.   Shoulder ROM intact.   No tenderness to palpation.  Negative Spurling's sign.   Negative Tinel's sign.   Negative Hoffman's sign.   ????  Sensation in the bilateral arms grossly intact to light touch.   ??  Lumbar spine:  No rash, ecchymosis, or gross obliquity.   No fasciculations.   No focal atrophy is noted.   No pain with hip ROM.   Full range of motion.  Tenderness to palpation at right buttock.  No tenderness to palpation at the sciatic notch.   Right SI joint tender.   Positive right FABER test. Negative left FABER test.  Negative bilateral p4 test.  Negative compression test.  Negative distraction test.  Positive right Gaenslen test.  Trochanters non tender.  Pain with back extension.  ????  Sensation in the bilateral legs grossly intact to light touch.    MOTOR:      Biceps  Triceps Deltoids Wrist Ext Wrist Flex Hand Intrin   Right 5/5 5/5 5/5 5/5 5/5 5/5   Left 5/5 5/5 5/5 5/5 5/5 5/5             Hip Flex  Quads Hamstrings Ankle DF EHL Ankle PF   Right 5/5 5/5 5/5 5/5 5/5 5/5   Left 5/5 5/5 5/5 5/5 5/5 5/5     DTRs are 2+ biceps, triceps, brachioradialis, patella, and Achilles.  ??  Positive right straight leg raise.  Squat not tested.   No difficulty with tandem gait.   Normal heel walk.   Normal toe rise.   ??  Ambulation with quad cane. FWB.  ??    RADIOGRAPHS  Lumbar XR images taken on 07/24/2015 personally reviewed with patient:  1) Right hip replacement  2) Mild rotation of the spine with a slight curvature to the right  3) Good overall disc spacing  4) No obvious fractures  ??  PMP reviewed  ??  Ms. Pinho has a reminder for a "due or due soon" health maintenance. I have asked that she contact her primary care provider for follow-up on this health maintenance.   ??   This plan was reviewed with the patient and patient agrees. All questions were answered. More than half of this visit today was spent on counseling.  ??  Written by Elvina Mattes, [place], as dictated by Dr. Marijo File.   ??  I, Dr. Marijo File, confirm that all documentation is accurate.

## 2015-08-28 ENCOUNTER — Encounter: Attending: Primary Care | Primary: Physician Assistant

## 2015-08-28 ENCOUNTER — Ambulatory Visit: Admit: 2015-08-28 | Discharge: 2015-08-28 | Payer: MEDICARE | Attending: Primary Care | Primary: Physician Assistant

## 2015-08-28 DIAGNOSIS — J441 Chronic obstructive pulmonary disease with (acute) exacerbation: Secondary | ICD-10-CM

## 2015-08-28 MED ORDER — IPRATROPIUM BROMIDE 0.02 % SOLN FOR INHALATION
0.02 % | Freq: Once | RESPIRATORY_TRACT | 0 refills | Status: AC
Start: 2015-08-28 — End: 2015-08-28

## 2015-08-28 MED ORDER — ALBUTEROL SULFATE 0.63 MG/3 ML SOLN FOR INHALATION
0.63 mg/3 mL | Freq: Once | RESPIRATORY_TRACT | 0 refills | Status: AC
Start: 2015-08-28 — End: 2015-08-28

## 2015-08-28 MED ORDER — TRAZODONE 50 MG TAB
50 mg | ORAL_TABLET | ORAL | 0 refills | Status: DC
Start: 2015-08-28 — End: 2015-11-23

## 2015-08-28 MED ORDER — LISINOPRIL 10 MG TAB
10 mg | ORAL_TABLET | Freq: Every day | ORAL | 1 refills | Status: DC
Start: 2015-08-28 — End: 2015-10-06

## 2015-08-28 NOTE — Progress Notes (Signed)
[name] [name] is a 65 y.o. female here today with c/o productive cough times 3 days. Denies ear pain but states her chest and throat hurts.    Learning assessment previously completed.    1. Have you been to the ER, urgent care clinic or hospitalized since your last visit? no    2. Have you seen or consulted any other [facility] providers [facility] since your last visit (Include any pap smears or colon screening)? no    Do you have an Advanced Directive? yes    Would you like information on Advanced Directives? no

## 2015-08-28 NOTE — Patient Instructions (Addendum)
COPD Exacerbation Plan: Care Instructions  Your Care Instructions  If you have chronic obstructive pulmonary disease (COPD), your usual shortness of breath could suddenly get worse. You may start coughing more and have more mucus. This flare-up is called a COPD exacerbation (say "ig-ZAS-[id number]-BAY-shun").  A lung infection or air pollution could set off an exacerbation. Sometimes it can happen after a quick change in temperature or being around chemicals.  Work with your doctor to make a plan for dealing with an exacerbation. You can better manage it if you plan ahead.  Follow-up care is a key part of your treatment and safety. Be sure to make and go to all appointments, and call your doctor if you are having problems. It's also a good idea to know your test results and keep a list of the medicines you take.  How can you care for yourself at home?  During an exacerbation  ?? Do not panic if you start to have one. Quick treatment at home may help you prevent serious breathing problems. If you have a COPD exacerbation plan that you developed with your doctor, follow it.  ?? Take your medicines exactly as your doctor tells you.  ?? Use your inhaler as directed by your doctor. If your symptoms do not get better after you use your medicine, have someone take you to the emergency room. Call an ambulance if necessary.  ?? With inhaled medicines, a spacer or a nebulizer may help you get more medicine to your lungs. Ask your doctor or pharmacist how to use them properly. Practice using the spacer in front of a mirror before you have an exacerbation. This may help you get the medicine into your lungs quickly.  ?? If your doctor has given you steroid pills, take them as directed.  ?? Your doctor may have given you a prescription for antibiotics, which you can fill if you need it.  ?? Talk to your doctor if you have any problems with your medicine. And call your doctor if you have to use your antibiotic or steroid pills.   Preventing an exacerbation  ?? Do not smoke. This is the most important step you can take to prevent more damage to your lungs and prevent problems. If you already smoke, it is never too late to stop. If you need help quitting, talk to your doctor about stop-smoking programs and medicines. These can increase your chances of quitting for good.  ?? Take your daily medicines as prescribed.  ?? Avoid colds and flu.  ?? Get a pneumococcal vaccine.  ?? Get a flu vaccine each year, as soon as it is available. Ask those you live or work with to do the same, so they will not get the flu and infect you.  ?? Try to stay away from people with colds or the flu.  ?? Wash your hands often.  ?? Avoid secondhand smoke; air pollution; cold, dry air; hot, humid air; and high altitudes. Stay at home with your windows closed when air pollution is bad.  ?? Learn breathing techniques for COPD, such as breathing through pursed lips. These techniques can help you breathe easier during an exacerbation.  When should you call for help?  Call 911 anytime you think you may need emergency care. For example, call if:  ?? You have severe trouble breathing.  ?? You have severe chest pain.  Call your doctor now or seek immediate medical care if:  ?? You have new or worse shortness of breath.  ??   You develop new chest pain.  ?? You are coughing more deeply or more often, especially if you notice more mucus or a change in the color of your mucus.  ?? You cough up blood.  ?? You have new or increased swelling in your legs or belly.  ?? You have a fever.  Watch closely for changes in your health, and be sure to contact your doctor if:  ?? You need to use your antibiotic or steroid pills.  ?? Your symptoms are getting worse.  Where can you learn more?  Go to [employer].  Enter [phone] in the search box to learn more about "COPD Exacerbation Plan: Care Instructions."  Current as of: January 01, 2015  Content Version: 11.1   ?? 2006-2016 [employer]. Care instructions adapted under license by [employer] (which disclaims liability or warranty for this information). If you have questions about a medical condition or this instruction, always ask your healthcare professional. [employer]wise, Incorporated disclaims any warranty or liability for your use of this information.       Low Sodium Diet (2,000 Milligram): Care Instructions  Your Care Instructions  Too much sodium causes your body to hold on to extra water. This can raise your blood pressure and force your heart and kidneys to work harder. In very serious cases, this could cause you to be put in the hospital. It might even be life-threatening. By limiting sodium, you will feel better and lower your risk of serious problems.  The most common source of sodium is salt. People get most of the salt in their diet from canned, prepared, and packaged foods. Fast food and restaurant meals also are very high in sodium. Your doctor will probably limit your sodium to less than 2,000 milligrams (mg) a day. This limit counts all the sodium in prepared and packaged foods and any salt you add to your food.  Follow-up care is a key part of your treatment and safety. Be sure to make and go to all appointments, and call your doctor if you are having problems. It's also a good idea to know your test results and keep a list of the medicines you take.  How can you care for yourself at home?  Read food labels  ?? Read labels on cans and food packages. The labels tell you how much sodium is in each serving. Make sure that you look at the serving size. If you eat more than the serving size, you have eaten more sodium.  ?? Food labels also tell you the Percent Daily Value for sodium. Choose products with low Percent Daily Values for sodium.  ?? Be aware that sodium can come in forms other than salt, including monosodium glutamate (MSG), sodium citrate, and sodium bicarbonate (baking  soda). MSG is often added to Asian food. When you eat out, you can sometimes ask for food without MSG or added salt.  Buy low-sodium foods  ?? Buy foods that are labeled "unsalted" (no salt added), "sodium-free" (less than 5 mg of sodium per serving), or "low-sodium" (less than 140 mg of sodium per serving). Foods labeled "reduced-sodium" and "light sodium" may still have too much sodium. Be sure to read the label to see how much sodium you are getting.  ?? Buy fresh vegetables, or frozen vegetables without added sauces. Buy low-sodium versions of canned vegetables, soups, and other canned goods.  Prepare low-sodium meals  ?? Cut back on the amount of salt you use in cooking. This will help  you adjust to the taste. Do not add salt after cooking. One teaspoon of salt has about 2,300 mg of sodium.  ?? Take the salt shaker off the table.  ?? Flavor your food with garlic, lemon juice, onion, vinegar, herbs, and spices. Do not use soy sauce, lite soy sauce, steak sauce, onion salt, garlic salt, celery salt, mustard, or ketchup on your food.  ?? Use low-sodium salad dressings, sauces, and ketchup. Or make your own salad dressings and sauces without adding salt.  ?? Use less salt (or none) when recipes call for it. You can often use half the salt a recipe calls for without losing flavor. Other foods such as rice, pasta, and grains do not need added salt.  ?? Rinse canned vegetables, and cook them in fresh water. This removes some???but not all???of the salt.  ?? Avoid water that is naturally high in sodium or that has been treated with water softeners, which add sodium. Call your local water company to find out the sodium content of your water supply. If you buy bottled water, read the label and choose a sodium-free brand.  Avoid high-sodium foods  ?? Avoid eating:  ?? Smoked, cured, salted, and canned meat, fish, and poultry.  ?? Ham, bacon, hot dogs, and luncheon meats.  ?? Regular, hard, and processed cheese and regular peanut butter.   ?? Crackers with salted tops, and other salted snack foods such as pretzels, chips, and salted popcorn.  ?? Frozen prepared meals, unless labeled low-sodium.  ?? Canned and dried soups, broths, and bouillon, unless labeled sodium-free or low-sodium.  ?? Canned vegetables, unless labeled sodium-free or low-sodium.  ?? Jamaica[place] fries, pizza, tacos, and other fast foods.  ?? Pickles, olives, ketchup, and other condiments, especially soy sauce, unless labeled sodium-free or low-sodium.  Where can you learn more?  Go to [employer]Stats[employer]cahttp://www.healthwise.net/[employer].  Enter [phone] in the search box to learn more about "Low Sodium Diet (2,000 Milligram): Care Instructions."  Current as of: January 06, 2015  Content Version: 11.1  ?? 2006-2016 [employer]. Care instructions adapted under license by [employer] (which disclaims liability or warranty for this information). If you have questions about a medical condition or this instruction, always ask your healthcare professional. Healthwise, Incorporated disclaims any warranty or liability for your use of this information.

## 2015-08-28 NOTE — Progress Notes (Signed)
Progress Note  Today's Date:  08/28/2015   Patient:  [name] [name]  Patient DOB:  [birth year]    Subjective:   [name] [name] is a 65 y.o. female who presents for follow up Hypertension and with c/o productive cough. States that she is compliant with medication; States that cough started 3 days ago. Is wheezing and has SOB. Was seen at the ED last week for the same problem over a week and, was prescribed Prednisone for three days which she finished. States that she felt better for a little while. Has been taking Niquil with minimal change.  Has been using her inhalers. States that she saw her pulmonologist last month and has a follow up appointment next month.   Also states that her BP is still elevated. Brought a one-week BP reading log; from that log reading rage is between 120/92 to 178/135. Beside the first reading, the rest is high. Copy will be scanned in patient's chart.   Has no other complaints at this time.     Current Outpatient Meds and Allergies     Current Outpatient Prescriptions on File Prior to Visit   Medication Sig Dispense Refill   ??? diclofenac EC (VOLTAREN) 75 mg EC tablet Take 1 Tab by mouth two (2) times daily as needed. 60 Tab 5   ??? metFORMIN (GLUCOPHAGE) 500 mg tablet Take 2 Tabs by mouth two (2) times daily (with meals). 120 Tab 2   ??? magnesium oxide (MAG-OX) 400 mg tablet Take 1 Tab by mouth daily. 30 Tab 2   ??? lisinopril (PRINIVIL, ZESTRIL) 10 mg tablet Take 1 Tab by mouth daily. 90 Tab 1   ??? potassium chloride (KLOR-CON M20) 20 mEq tablet Take 1 Tab by mouth daily. 90 Tab 1   ??? amLODIPine (NORVASC) 10 mg tablet Take 1 Tab by mouth daily. 30 Tab 1   ??? Blood-Glucose Meter monitoring kit Check sugars 2 times per day, fasting and 2 hours after dinner 1 Kit 0   ??? Lancets misc Check sugars twice daily 200 Each 2   ??? glucose blood VI test strips (ASCENSIA AUTODISC VI, ONE TOUCH ULTRA TEST VI) strip Check sugars twice daily 100 Strip 5    ??? albuterol (PROVENTIL HFA, VENTOLIN HFA, PROAIR HFA) 90 mcg/actuation inhaler Take 2 Puffs by inhalation every six (6) hours as needed for Wheezing or Shortness of Breath. 1 Inhaler 0   ??? traZODone (DESYREL) 50 mg tablet TAKE ONE TABLET BY MOUTH AT BEDTIME 30 Tab 0   ??? methocarbamol (ROBAXIN) 750 mg tablet Take 1 Tab by mouth three (3) times daily. 20 Tab 0   ??? oxyCODONE-acetaminophen (PERCOCET) 5-325 mg per tablet Take 1 Tab by mouth every twelve (12) hours as needed for Pain. Max Daily Amount: 2 Tabs. 40 Tab 0   ??? SEROQUEL XR 150 mg sr tablet TAKE 1 TABLET BY MOUTH NIGHTLY 30 Tab 2     No current facility-administered medications on file prior to visit.      These medications have been reviewed and reconciled with the patient during today's visit.      Allergies   Allergen Reactions   ??? Chocolate [Cocoa] Sneezing       ROS:     CONST:   Denies fatigue, weight change, appetite change   NEURO:   Denies headaches, vision changes, dizziness, loss of consciousness  CV:      Denies chest pain, palpitations, orthopnea, PND  PULM:  Denies SOB, wheezing, cough, hemoptysis  Objective:     VS:    Visit Vitals   ??? BP (!) 158/117 (BP 1 Location: Right arm, BP Patient Position: Sitting)   ??? Pulse 87   ??? Temp 98.2 ??F (36.8 ??C) (Oral)   ??? Resp 20   ??? Ht 5' 6"  (1.676 m)   ??? SpO2 97%   ??? BMI 30.99 kg/m2       General:   well-nourished, well-groomed, pleasant, alert, in no acute distress.     Cardiovasc:   Regular rate and rhythm, no murmurs, no rubs, no gallops,   Pulmonary:   Coarse breath sound throughout, productive cough with clear sputum; mild respiratory effort  Extremities:   No edema, no tenderness with palpation of calves, warm and well-perfused  Neuro:   Alert, conversant, appropriate, following commands, no focal deficits.     Assessment:       1. COPD exacerbation ([place])    2. Essential hypertension        Plan:       Orders Placed This Encounter   ??? lisinopril (PRINIVIL, ZESTRIL) 10 mg tablet      Sig: Take 2 Tabs by mouth daily. Indications: hypertension     Dispense:  90 Tab     Refill:  1   ??? ipratropium (ATROVENT) 0.02 % nebulizer solution     Sig: 2.5 mL by Nebulization route once for 1 dose. Indications: CHRONIC OBSTRUCTIVE ASTHMA     Dispense:  1 mL     Refill:  0     Order Specific Question:   Expiration Date     Answer:   08/10/2016     Order Specific Question:   Lot#     Answer:   [id number]     Order Specific Question:   Manufacturer     Answer:   [employer]     Order Specific Question:   NDC#     Answer:   [phone]   ??? albuterol (ACCUNEB) 0.63 mg/3 mL nebulizer solution     Sig: 3 mL by Nebulization route once for 1 dose. Indications: Chronic Obstructive Pulmonary Disease     Dispense:  1 Vial     Refill:  0     Order Specific Question:   Expiration Date     Answer:   10/11/2015     Order Specific Question:   Lot#     Answer:   [id number]     Order Specific Question:   Manufacturer     Answer:   [employer]     Order Specific Question:   NDC#     Answer:   [phone]     Breathing treatment given x 1 time. Decreased coarseness and better air flow. States that she feels better.   Lisinopril increased to 20 mg; was asked to continue recording BP at [facility] and to bring the log at next visit.   I have discussed the diagnosis with the patient and the intended plan as seen in the above orders.  The patient has received an after-visit summary along with patient information handout.  I have discussed medication side effects and warnings with the patient as well. Pt verbalized understanding.    Follow-up Disposition:  Return in about 2 weeks (around 09/11/2015) for with Dr. Gaetano Net.  Caryl Bis, FNP  08/28/2015, 12:15 PM

## 2015-09-01 ENCOUNTER — Encounter

## 2015-09-01 MED ORDER — QUETIAPINE SR 150 MG 24 HR TAB
150 mg | ORAL_TABLET | Freq: Every evening | ORAL | 2 refills | Status: DC
Start: 2015-09-01 — End: 2015-09-01

## 2015-09-01 MED ORDER — QUETIAPINE SR 150 MG 24 HR TAB
150 mg | ORAL_TABLET | Freq: Every evening | ORAL | 1 refills | Status: DC
Start: 2015-09-01 — End: 2015-11-02

## 2015-09-01 NOTE — Telephone Encounter (Signed)
90 day supply

## 2015-09-15 ENCOUNTER — Ambulatory Visit: Attending: Anesthesiology | Primary: Physician Assistant

## 2015-09-15 ENCOUNTER — Ambulatory Visit: Admit: 2015-09-15 | Discharge: 2015-09-15 | Payer: MEDICARE | Attending: Anesthesiology | Primary: Physician Assistant

## 2015-09-15 DIAGNOSIS — M47816 Spondylosis without myelopathy or radiculopathy, lumbar region: Secondary | ICD-10-CM

## 2015-09-15 NOTE — Progress Notes (Signed)
[facility]  Interventional Pain Management Consultation History & Physical    PATIENT NAME:  [name] [name]     DATE OF BIRTH:   [birth year]    DATE OF SERVICE:   09/15/2015      CHIEF COMPLAINT:  LOW BACK PAIN and Head Pain      HISTORY OF PRESENT ILLNESS:   [name] [name] presents to the pain clinic today for initial evaluation and to consider interventional pain management options as indicated for the type and location of the pain the patient is presenting with.          [name] [name] patient presents for initial evaluation and consideration for interventional procedures as indicated.  She is referred to [place] by Dr. Teresa Pelton for right-sided sacroiliac radiofrequency neurotomy procedures as indicated.      At today's evaluation patient endorses right-sided hip pain of long-standing duration, greater than 2 years.  She endorses right hip and right buttocks pain and tenderness.  This pain spreads throughout her right hip and posterior right buttocks, and two thirds of the way down her right upper leg.  She endorses aching sharp throbbing pain, especially increased with weightbearing and activity.  Pain is improved with taking her medications.  She takes Percocet tablets up to 4 tablets a day.      Patient has had previous right-sided sacroiliac joint injections, for which she found great relief.  Pain has returned after about a week.  For this, and she is referred for right SI joint radiofrequency neurotomy procedure.  By review of available records, patient is on diclofenac as needed.  Patient has history chronic pain, diabetes, GERD, hypertension, liver disease, hepatitis C, posttraumatic stress disorder.  She has had right-sided hip replacement surgery.  Patient has a history of accelerated hypertension.  She recently saw her primary care provider who increased her lisinopril regimen.  Patient has been counseled to follow-up with her  blood pressure.  I have asked the patient to be sure to follow-up with her primary care provider regarding chronic management of her hypertension.  When patient last saw Dr. Teresa Pelton, Dr. Ernestina Patches mentioned     getting a lumbar MRI if the patient is still having pain from her right SI joint after radiofrequency neurotomy for procedure.  X-rays of the right hip reveal      Stable postoperative changes right hip total arthroplasty.     We discussed options.  Patient presents with a greater than 2 year history of right hip and right buttocks pain.  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  X-ray studies have revealed stable postoperative hardware.  Patient had a right SI joint injection with very good relief of her right posterior buttock and hip pain for 1 week.  She is referred to [place] for right hip radiofrequency neurotomy procedure.  I have told the patient that her insurance will not cover this procedure, and then therefore we need to look for other ways to manage her pain.  I mentioned to the patient that her right hip hardware looks stable.  I mentioned to the patient that her other providers have recommended the patient have a lumbar MRI to determine if the patient may have lumbar pathology that may be contributing to her right hip and right buttocks pain.  I have placed an order for right hip MRI without contrast.  Patient is upset with me that we  are not able to do her procedure.  I told the patient that [facility] will not cover this procedure.  I did recommend the patient follow-up with lumbar MRI, and I printed out this order for the patient.  I have also recommended that the patient be sure to follow-up with her primary care and or other providers regarding her accelerated hypertension.  Patient has left the clinic apparently upset with me.  No follow on appointment is given at this time.      MRI: None    PROCEDURES: None    MRI Results (most recent):     Results from Hospital Encounter encounter on 04/07/15   MRI BRAIN W WO CONT   Narrative CLINICAL HISTORY:  Abnormal CT, meningioma    PROCEDURE: MRI brain without and with contrast    COMPARISON: Head CT dated 04/08/2015    FINDINGS:    There is age-related cerebral atrophy. There is extensive chronic small vessel  ischemic change, advanced for patient age. As noted on CT, there is a enhancing  extra-axial mass in the left CP angle along the petrous temporal bone measuring  10 mm. This is observed on image 9 series 12, image 6 series 11 and image 21  series 13. There are dural tails extending to the tentorium cerebelli. Imaging  findings would favor meningioma. No other areas of abnormal brain parenchymal  enhancement. The sella, pineal region, craniocervical junction are unremarkable.  Skull base flow voids are patent.         Impression IMPRESSION:  1. Enhancing mass in the left posterior fossa adjacent to the petrous temporal  bone as described above. Main consideration would include meningioma. Other less  likely differential considerations would include dural metastasis and lymphoma.  Consider surveillance or further evaluation.    2.  Chronic small vessel ischemic changes, advanced for patient age.              PAST MEDICAL HISTORY:   The patient  has a past medical history of Arthritis; Chronic pain; Diabetes (HCC); GERD (gastroesophageal reflux disease); Hypertension; Liver disease; and PTSD (post-traumatic stress disorder).    PAST SURGICAL HISTORY:   The patient  has a past surgical history that includes gyn (2010); endoscopy; hip replacement (Right, 01/29/2015); and hip replacement.    CURRENT MEDICATIONS:   The patient has a current medication list which includes the following prescription(s): quetiapine sr, trazodone, lisinopril, diclofenac ec, metformin, magnesium oxide, potassium chloride, amlodipine, methocarbamol, blood-glucose meter, lancets, glucose blood vi  test strips, oxycodone-acetaminophen, and albuterol.    ALLERGIES:     Allergies   Allergen Reactions   ??? Chocolate [Cocoa] Sneezing       FAMILY HISTORY:   The patient family history includes Alcohol abuse in her maternal grandfather; Cancer in her maternal aunt; Hypertension in her mother.    SOCIAL HISTORY:   The patient  reports that she has never smoked. She has never used smokeless tobacco. The patient  reports that she does not drink alcohol. She also  reports that she does not use illicit drugs.    REVIEW OF SYSTEMS:   The patient denies fever, chills, weight loss (Constitutional), rash, itching (Skin), tinnitus, congestion (HENT), blurred vision, photophobia (Eyes), palpitations, orthopnea (Cardiovascular), hemoptysis, wheezing (Respiratory), nausea, vomiting, diarrhea (Gastrointestinal), dysuria, hematuria, urgency (Genitourinary), easy bruising, bleeding abnormalities (Hematologic), bowel or bladder incontinence, loss of consciousness (Neurologic), suicidal or homicidal ideation or hallucinations (Psychiatric).         PHYSICAL EXAM:  VS:  Visit Vitals   ??? BP (!) 170/116   ??? Pulse 86   ??? Wt 87.1 kg (192 lb)   ??? BMI 30.99 kg/m2     General: Well-developed and well-nourished. Body habitus consistent with recorded height and weight and the calculated BMI. Apparent distress due to right hip and buttocks pain.   Head: Normocephalic, atraumatic.  Skin: Inspection of the skin reveals no rashes, lesions or infection.  CV: Regular rate. No murmurs or rubs noted. No peripheral edema noted.  Pulm: Respirations are even and unlabored.  Extr: No clubbing, cyanosis, or edema noted.  Musculoskeletal:  1. Cervical spine ??? Full ROM.  No paraspinous tenderness at any level.  There is no scoliosis, asymmetry, or musculoskeletal defect.  2. Thoracic spine ??? Full ROM.  No paraspinous tenderness at any level.  There is no scoliosis, asymmetry, or musculoskeletal defect.   3. Lumbar spine ???decreased range of motion on the right . No paraspinous tenderness at any level.  SI joint is tender on the right. There is no scoliosis, asymmetry, or musculoskeletal defect.  4. Right upper extremity ??? Full ROM.  5/5 muscle strength in all muscle groups. No pain or tenderness in shoulder, elbow, wrist, or hand.  5. Left upper extremity ??? Full ROM.  5/5 muscle strength in all muscle groups.  No pain or tenderness in shoulder, elbow, wrist, or hand.  6. Right lower extremity ??? Full ROM.  5/5 muscle strength in all muscle groups. No pain, tenderness, or swelling in the hip, knee, ankle or foot.    7. Left lower extremity ??? Full ROM.  5/5 muscle strength in all muscle groups.  No pain, tenderness, or swelling in the hip, knee, ankle or foot.    Neurological:  1. Mental Status - Alert, awake and oriented. Speech is clear and appropriate.  2. Cranial Nerves - Extraocular muscles intact bilaterally. Cranial nerves II-XII grossly intact bilaterally.  3. Gait - antalgic   4. Reflexes - 2+ and symmetric throughout.  5. Sensation - Intact to light touch and pin prick.   6. Provocative Tests -  Straight leg raise negative bilaterally.   Psychological:  1. Mood and affect ??? Appropriate.  2. Speech ??? Appropriate.  3. Though content ??? Appropriate.  4. Judgment ??? Appropriate.    ASSESSMENT:      ICD-10-CM ICD-9-CM    1. Spondylosis of lumbar region without myelopathy or radiculopathy M47.816 721.3 MRI LUMB SPINE WO CONT   2. Lumbar and sacral osteoarthritis M47.817 721.3 MRI LUMB SPINE WO CONT   3. Chronic pain syndrome G89.4 338.4    4. Primary osteoarthritis of both hips M16.0 715.15    5. Accelerated hypertension I10 401.0            PLAN:    1.    Diagnoses/Plan: Lumbar spondylosis, lumbar sacral arthritis, chronic pain syndrome, primary osteoarthritis of the hips, accelerated hypertension. We discussed options.  Patient presents with a greater than  2 year history of right hip and right buttocks pain.  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  X-ray studies have revealed stable postoperative hardware.  Patient had a right SI joint injection with very good relief of her right posterior buttock and hip pain for 1 week.  She is referred to us for right hip radiofrequency neurotomy procedure.  I have told the patient that her insurance will not cover this procedure, and then therefore we need to look for other  ways to manage her pain.  I mentioned to the patient that her right hip hardware looks stable.  I mentioned to the patient that her other providers have recommended the patient have a lumbar MRI to determine if the patient may have lumbar pathology that may be contributing to her right hip and right buttocks pain.  I have placed an order for right hip MRI without contrast.  Patient is upset with me that we are not able to do her procedure.  I told the patient that [facility] will not cover this procedure.  I did recommend the patient follow-up with lumbar MRI, and I printed out this order for the patient.  I have also recommended that the patient be sure to follow-up with her primary care and or other providers regarding her accelerated hypertension.  Patient has left the clinic apparently upset with me.  No follow on appointment is given at this time.  2.    I have thoroughly discussed the risks and benefits, side effects and complications, of any and all procedures that were mentioned at today's patient visit. I have used a skeleton model for added emphasis and patient education. I have answered all questions, and I have obtained verbal confirmation for all procedures planned with the patient.   3.    I have reviewed in great detail today the patient's MRI and other imaging studies with the patient. I have explained to the patient their condition using both actual recent and relevant images. I have used a  skeleton model for added emphasis as well as patient education.      4.    I have advised patient to have a primary care provider to continue care for health maintenance and general medical conditions and support for referral to specialty care as needed.  5.    I have reviewed with patient the treatment plan, goals of treatment plan, and limitations of treatment plan, to include the potential for side effects from medications and procedures.  If side effects occur, it is the responsibility of the patient to inform the clinic so that a change in the treatment plan can be made in a safe manner. The patient is advised that stopping prescribed medication may cause an increase in symptoms and possible medication withdrawal symptoms. The patient is informed an emergency room evaluation may be necessary if this occurs.      DISPOSITION:   The patient???s condition and plan were discussed at length and all questions were answered.  The patient agrees with the plan.    A total of 45 minutes was spent with the patient of which over half of the time was spent counseling the patient.     Seth Bake, MD 09/15/2015 7:12 PM    Note: Although these clinic notes were documented by the provider at the time of the exam, they have not been proofed and are subject to transcription variance.

## 2015-09-15 NOTE — Progress Notes (Signed)
Pt here for NP appt. Pt relays she took her B/P meds today but is c/o headache.

## 2015-10-06 ENCOUNTER — Inpatient Hospital Stay: Admit: 2015-10-06 | Payer: MEDICARE | Primary: Physician Assistant

## 2015-10-06 ENCOUNTER — Ambulatory Visit: Admit: 2015-10-06 | Discharge: 2015-10-06 | Payer: MEDICARE | Attending: Family Medicine | Primary: Physician Assistant

## 2015-10-06 ENCOUNTER — Encounter: Attending: Family Medicine | Primary: Physician Assistant

## 2015-10-06 DIAGNOSIS — R300 Dysuria: Secondary | ICD-10-CM

## 2015-10-06 LAB — AMB POC URINALYSIS DIP STICK AUTO W/O MICRO
Bilirubin (UA POC): NEGATIVE
Glucose (UA POC): NEGATIVE
Ketones (UA POC): NEGATIVE
Nitrites (UA POC): NEGATIVE
Protein (UA POC): NEGATIVE mg/dL
Specific gravity (UA POC): 1.015 (ref 1.001–1.035)
Urobilinogen (UA POC): 0.2 (ref 0.2–1)
pH (UA POC): 5.5 (ref 4.6–8.0)

## 2015-10-06 MED ORDER — KETOROLAC TROMETHAMINE 30 MG/ML INJECTION
30 mg/mL (1 mL) | Freq: Once | INTRAMUSCULAR | 0 refills | Status: AC
Start: 2015-10-06 — End: 2015-10-06

## 2015-10-06 MED ORDER — METFORMIN SR 750 MG 24 HR TABLET
750 mg | ORAL_TABLET | Freq: Every day | ORAL | 1 refills | Status: DC
Start: 2015-10-06 — End: 2015-11-30

## 2015-10-06 MED ORDER — TRIMETHOPRIM-SULFAMETHOXAZOLE 160 MG-800 MG TAB
160-800 mg | ORAL_TABLET | Freq: Two times a day (BID) | ORAL | 0 refills | Status: AC
Start: 2015-10-06 — End: 2015-10-13

## 2015-10-06 MED ORDER — OXYCODONE-ACETAMINOPHEN 10 MG-325 MG TAB
10-325 mg | ORAL_TABLET | Freq: Four times a day (QID) | ORAL | 0 refills | Status: DC | PRN
Start: 2015-10-06 — End: 2015-10-19

## 2015-10-06 MED ORDER — LISINOPRIL 40 MG TAB
40 mg | ORAL_TABLET | Freq: Every day | ORAL | 1 refills | Status: DC
Start: 2015-10-06 — End: 2015-10-19

## 2015-10-06 NOTE — Patient Instructions (Addendum)
Start bactrim twice a day for Urinary tract infection.  Increase fluid intake to 8 glasses daily      Start lisinopril 40mg  daily.        Follow up with Dr Elio ForgetHenick for continued pain.        Follow up with me in 3 weeks with your blood sugar on paper.         Urinary Tract Infection in Women: Care Instructions  Your Care Instructions    A urinary tract infection, or UTI, is a general term for an infection anywhere between the kidneys and the urethra (where urine comes out). Most UTIs are bladder infections. They often cause pain or burning when you urinate.  UTIs are caused by bacteria and can be cured with antibiotics. Be sure to complete your treatment so that the infection goes away.  Follow-up care is a key part of your treatment and safety. Be sure to make and go to all appointments, and call your doctor if you are having problems. It's also a good idea to know your test results and keep a list of the medicines you take.  How can you care for yourself at home?  ?? Take your antibiotics as directed. Do not stop taking them just because you feel better. You need to take the full course of antibiotics.  ?? Drink extra water and other fluids for the next day or two. This may help wash out the bacteria that are causing the infection. (If you have kidney, heart, or liver disease and have to limit fluids, talk with your doctor before you increase your fluid intake.)  ?? Avoid drinks that are carbonated or have caffeine. They can irritate the bladder.  ?? Urinate often. Try to empty your bladder each time.  ?? To relieve pain, take a hot bath or lay a heating pad set on low over your lower belly or genital area. Never go to sleep with a heating pad in place.  To prevent UTIs  ?? Drink plenty of water each day. This helps you urinate often, which clears bacteria from your system. (If you have kidney, heart, or liver disease and have to limit fluids, talk with your doctor before you increase your fluid intake.)   ?? Urinate when you need to.  ?? Urinate right after you have sex.  ?? Change sanitary pads often.  ?? Avoid douches, bubble baths, feminine hygiene sprays, and other feminine hygiene products that have deodorants.  ?? After going to the bathroom, wipe from front to back.  When should you call for help?  Call your doctor now or seek immediate medical care if:  ?? Symptoms such as fever, chills, nausea, or vomiting get worse or appear for the first time.  ?? You have new pain in your back just below your rib cage. This is called flank pain.  ?? There is new blood or pus in your urine.  ?? You have any problems with your antibiotic medicine.  Watch closely for changes in your health, and be sure to contact your doctor if:  ?? You are not getting better after taking an antibiotic for 2 days.  ?? Your symptoms go away but then come back.  Where can you learn more?  Go to InsuranceStats.cahttp://www.healthwise.net/GoodHelp[employer]ctions.  Enter [id number] in the search box to learn more about "Urinary Tract Infection in Women: Care Instructions."  Current as of: May 11, 2015  Content Version: 11.2  ?? 2006-2017 [employer]. Care instructions adapted under license  by [employer] (which disclaims liability or warranty for this information). If you have questions about a medical condition or this instruction, always ask your healthcare professional. Healthwise, Inc[employer] disclaims any warranty or liability for your use of this information.

## 2015-10-06 NOTE — Progress Notes (Signed)
Chief Complaint   Patient presents with   ??? Urinary Hesitancy   ??? Urinary Frequency   ??? Hip Pain     right         SUBJECTIVE: [name] [name] is a 65 y.o. female who complains of urinary frequency, urgency  x 4 days, without flank pain, fever, chills, or abnormal vaginal discharge or bleeding.     She continues to have severe right hip pain. She has seen Dr Kathyrn Sheriff that appointment apparently did not go well according to her.    She has been using her metformin 1011m BID. She has recently started checking her blood sugars but does not know the values as her husband does this for her.  She reports significant nausea and upset stomach with its use.    He has been using her blood pressure medication as prescribed. Does not check her BP at home.    Past Medical History:   Diagnosis Date   ??? Arthritis    ??? Chronic pain    [place]    ??? GERD (gastroesophageal reflux disease)    ??? Hypertension    ??? Liver disease     HEP C   ??? PTSD (post-traumatic stress disorder)     [place]       Current Outpatient Prescriptions   Medication Sig   ??? trimethoprim-sulfamethoxazole (BACTRIM DS, SEPTRA DS) 160-800 mg per tablet Take 1 Tab by mouth two (2) times a day for 7 days.   ??? lisinopril (PRINIVIL, ZESTRIL) 40 mg tablet Take 1 Tab by mouth daily.   ??? ketorolac (TORADOL) 30 mg/mL (1 mL) injection 2 mL by IntraMUSCular route once for 1 dose.   ??? oxyCODONE-acetaminophen (PERCOCET 10) 10-325 mg per tablet Take 1 Tab by mouth every six (6) hours as needed for Pain. Max Daily Amount: 4 Tabs.   ??? metFORMIN ER (GLUCOPHAGE XR) 750 mg tablet Take 2 Tabs by mouth daily.   ??? QUEtiapine SR (SEROQUEL XR) 150 mg sr tablet Take 1 Tab by mouth nightly.   ??? magnesium oxide (MAG-OX) 400 mg tablet Take 1 Tab by mouth daily.   ??? potassium chloride (KLOR-CON M20) 20 mEq tablet Take 1 Tab by mouth daily.   ??? amLODIPine (NORVASC) 10 mg tablet Take 1 Tab by mouth daily.    ??? traZODone (DESYREL) 50 mg tablet TAKE ONE TABLET BY MOUTH AT BEDTIME   ??? diclofenac EC (VOLTAREN) 75 mg EC tablet Take 1 Tab by mouth two (2) times daily as needed.   ??? methocarbamol (ROBAXIN) 750 mg tablet Take 1 Tab by mouth three (3) times daily.   ??? Blood-Glucose Meter monitoring kit Check sugars 2 times per day, fasting and 2 hours after dinner   ??? Lancets misc Check sugars twice daily   ??? glucose blood VI test strips (ASCENSIA AUTODISC VI, ONE TOUCH ULTRA TEST VI) strip Check sugars twice daily   ??? albuterol (PROVENTIL HFA, VENTOLIN HFA, PROAIR HFA) 90 mcg/actuation inhaler Take 2 Puffs by inhalation every six (6) hours as needed for Wheezing or Shortness of Breath.     No current facility-administered medications for this visit.        Allergies   Allergen Reactions   ??? Chocolate [Cocoa] Sneezing       Past Surgical History:   Procedure Laterality Date   ??? HX ENDOSCOPY     ??? HX GYN  2010    hysterectomy   ???  HX HIP REPLACEMENT Right 01/29/2015   ??? HX HIP REPLACEMENT         Family History   Problem Relation Age of Onset   ??? Hypertension Mother    ??? Cancer Maternal Aunt    ??? Alcohol abuse Maternal Grandfather        Social History     Social History   ??? Marital status: MARRIED     Spouse name: N/A   ??? Number of children: N/A   ??? Years of education: N/A     Occupational History   ??? Not on file.     Social History Main Topics   ??? Smoking status: Never Smoker   ??? Smokeless tobacco: Never Used   ??? Alcohol use No   ??? Drug use: No   ??? Sexual activity: No     Other Topics Concern   ??? Not on file     Social History Narrative       Cardiovascular ROS: no chest pain or dyspnea on exertion  OBJECTIVE: Appears well, in no apparent distress.  Heart- RRR, no M/R/R Chest- CTABL, No W/R/R The abdomen is soft without tenderness, guarding, mass, rebound or organomegaly. No CVA tenderness or inguinal adenopathy noted.    Results for orders placed or performed in visit on 10/06/15    AMB POC URINALYSIS DIP STICK AUTO W/O MICRO     Status: None   Result Value Ref Range Status    Color (UA POC) Yellow  Final    Clarity (UA POC) Turbid  Final    Glucose (UA POC) Negative Negative Final    Bilirubin (UA POC) Negative Negative Final    Ketones (UA POC) Negative Negative Final    Specific gravity (UA POC) 1.015 1.001 - 1.035 Final    Blood (UA POC) Trace Negative Final     Comment: trace-lysed    pH (UA POC) 5.5 4.6 - 8.0 Final    Protein (UA POC) Negative Negative mg/dL Final    Urobilinogen (UA POC) 0.2 mg/dL 0.2 - 1 Final    Nitrites (UA POC) Negative Negative Final    Leukocyte esterase (UA POC) 2+ Negative Final   Results for orders placed or performed in visit on 06/29/15   AMB POC URINALYSIS DIP STICK AUTO W/ MICRO     Status: None   Result Value Ref Range Status    Color (UA POC) Yellow  Final    Clarity (UA POC) Clear  Final    Glucose (UA POC) 2+ Negative Final    Bilirubin (UA POC) Negative Negative Final    Ketones (UA POC) Negative Negative Final    Specific gravity (UA POC) 1.015 1.001 - 1.035 Final    Blood (UA POC) 2+ Negative Final    pH (UA POC) 5.0 4.6 - 8.0 Final    Protein (UA POC) 2+ Negative mg/dL Final    Urobilinogen (UA POC) 0.2 mg/dL 0.2 - 1 Final    Nitrites (UA POC) Negative Negative Final    Leukocyte esterase (UA POC) Negative Negative Final                 1. Dysuria  Start bactrim bid x 7 days  - AMB POC URINALYSIS DIP STICK AUTO W/O MICRO  - CULTURE, URINE; Future  - trimethoprim-sulfamethoxazole (BACTRIM DS, SEPTRA DS) 160-800 mg per tablet; Take 1 Tab by mouth two (2) times a day for 7 days.  Dispense: 14 Tab; Refill: 0    2.  Type 2 diabetes mellitus with hyperglycemia, without long-term current use of insulin (HCC)  Will change Metformin IR to XR due to nausea   - metFORMIN ER (GLUCOPHAGE XR) 750 mg tablet; Take 2 Tabs by mouth daily.  Dispense: 60 Tab; Refill: 1    3. Chronic hip pain, right  Follow up with Dr Elliot Gurney   - ketorolac (TORADOL) 30 mg/mL (1 mL) injection; 2 mL by IntraMUSCular route once for 1 dose.  Dispense: 2 Vial; Refill: 0  - oxyCODONE-acetaminophen (PERCOCET 10) 10-325 mg per tablet; Take 1 Tab by mouth every six (6) hours as needed for Pain. Max Daily Amount: 4 Tabs.  Dispense: 60 Tab; Refill: 0    4. Essential hypertension  Uncontrolled. Increase lisinopril to 49m daily. Continue Amlodiipine, clonidine  - lisinopril (PRINIVIL, ZESTRIL) 40 mg tablet; Take 1 Tab by mouth daily.  Dispense: 90 Tab; Refill: 1      Shamyra Farias KDyann Kief MD

## 2015-10-06 NOTE — Progress Notes (Signed)
[name] [name] is a 65 y.o. female in today with c/o urinary urgency, frequency and hesitancy since Friday.    Learning assessment previously completed; primary language is [place].    1. Have you been to the ER, urgent care clinic since your last visit?  Hospitalized since your last visit?No    2. Have you seen or consulted any other health [facility] providers [facility] since your last visit?  Include any pap smears or colon screening. No

## 2015-10-08 LAB — CULTURE, URINE
Culture result:: NO GROWTH
Culture: NO GROWTH

## 2015-10-08 NOTE — Progress Notes (Signed)
Patient aware of results and recommendations.

## 2015-10-19 ENCOUNTER — Ambulatory Visit: Admit: 2015-10-19 | Discharge: 2015-10-19 | Payer: MEDICARE | Attending: Family Medicine | Primary: Physician Assistant

## 2015-10-19 DIAGNOSIS — M79604 Pain in right leg: Secondary | ICD-10-CM

## 2015-10-19 MED ORDER — LOSARTAN 100 MG TAB
100 mg | ORAL_TABLET | Freq: Every day | ORAL | 2 refills | Status: DC
Start: 2015-10-19 — End: 2015-10-20

## 2015-10-19 MED ORDER — SPIRONOLACTONE 100 MG TAB
100 mg | ORAL_TABLET | Freq: Every day | ORAL | 2 refills | Status: DC
Start: 2015-10-19 — End: 2015-10-20

## 2015-10-19 MED ORDER — OXYCODONE-ACETAMINOPHEN 10 MG-325 MG TAB
10-325 mg | ORAL_TABLET | Freq: Four times a day (QID) | ORAL | 0 refills | Status: DC | PRN
Start: 2015-10-19 — End: 2015-11-24

## 2015-10-19 NOTE — Progress Notes (Signed)
Leg Pain        HPI: [name] [name] is a 65 y.o. female [place]  Here with acute right leg pain for the last 2-3 weeks after an accidental fall, although she does not recall the exact date. Her chronic right hip pain has been exacerbated by this. She has run out of the percocet that I have prescribed her 3 weeks ago. She has followed up with pain management, Dr Belia Heman, but did not have a good experience and does not want to follow up in that office.    She also reports a chronic, NP cough., been there for several weeks-months. Denies fevers, sputum production or hemoptysis.          Past Medical History:   Diagnosis Date   ??? Arthritis    ??? Chronic pain    ??? Diabetes ([place])    ??? GERD (gastroesophageal reflux disease)    ??? Hypertension    ??? Liver disease     HEP C   ??? PTSD (post-traumatic stress disorder)     lived through the [place] in 1993       Current Outpatient Prescriptions   Medication Sig   ??? lisinopril (PRINIVIL, ZESTRIL) 40 mg tablet Take 1 Tab by mouth daily.   ??? oxyCODONE-acetaminophen (PERCOCET 10) 10-325 mg per tablet Take 1 Tab by mouth every six (6) hours as needed for Pain. Max Daily Amount: 4 Tabs.   ??? metFORMIN ER (GLUCOPHAGE XR) 750 mg tablet Take 2 Tabs by mouth daily.   ??? QUEtiapine SR (SEROQUEL XR) 150 mg sr tablet Take 1 Tab by mouth nightly.   ??? traZODone (DESYREL) 50 mg tablet TAKE ONE TABLET BY MOUTH AT BEDTIME   ??? diclofenac EC (VOLTAREN) 75 mg EC tablet Take 1 Tab by mouth two (2) times daily as needed.   ??? magnesium oxide (MAG-OX) 400 mg tablet Take 1 Tab by mouth daily.   ??? potassium chloride (KLOR-CON M20) 20 mEq tablet Take 1 Tab by mouth daily.   ??? amLODIPine (NORVASC) 10 mg tablet Take 1 Tab by mouth daily.   ??? Blood-Glucose Meter monitoring kit Check sugars 2 times per day, fasting and 2 hours after dinner   ??? Lancets misc Check sugars twice daily   ??? glucose blood VI test strips (ASCENSIA AUTODISC VI, ONE TOUCH ULTRA TEST  VI) strip Check sugars twice daily   ??? methocarbamol (ROBAXIN) 750 mg tablet Take 1 Tab by mouth three (3) times daily.   ??? albuterol (PROVENTIL HFA, VENTOLIN HFA, PROAIR HFA) 90 mcg/actuation inhaler Take 2 Puffs by inhalation every six (6) hours as needed for Wheezing or Shortness of Breath.     No current facility-administered medications for this visit.        Allergies   Allergen Reactions   ??? Chocolate [Cocoa] Sneezing       Past Surgical History:   Procedure Laterality Date   ??? HX ENDOSCOPY     ??? HX GYN  2010    hysterectomy   ??? HX HIP REPLACEMENT Right 01/29/2015   ??? HX HIP REPLACEMENT         Family History   Problem Relation Age of Onset   ??? Hypertension Mother    ??? Cancer Maternal Aunt    ??? Alcohol abuse Maternal Grandfather        Social History     Social History   ??? Marital status: MARRIED     Spouse  name: N/A   ??? Number of children: N/A   ??? Years of education: N/A     Occupational History   ??? Not on file.     Social History Main Topics   ??? Smoking status: Never Smoker   ??? Smokeless tobacco: Never Used   ??? Alcohol use No   ??? Drug use: No   ??? Sexual activity: No     Other Topics Concern   ??? Not on file     Social History Narrative       Gen- No weight loss, No Malaise, No fatigue  Eyes- No dipoplia, blurry vision  CVS- No Chest pain, no palpitations, no edema  Neuro- Recurrent headaches  Skin- No easy bruising, No rashes      Visit Vitals   ??? BP (!) 157/123 (BP 1 Location: Left arm, BP Patient Position: Sitting)   ??? Pulse 76   ??? Temp 97.7 ??F (36.5 ??C) (Oral)   ??? Resp 18   ??? Ht 5' 6" (1.676 m)   ??? Wt 193 lb (87.5 kg)   ??? SpO2 95%   ??? BMI 31.15 kg/m2       GEN- NAD, AAOx3  CVS- RRR, +S1, +S2, No Murmurs, No JVD  PULM- CTABL, No W/R/R  EXT- No edema  NEURO-Normal Gait  PSYCH- Euthymic, normal afffect        Assesment:  1. Acute leg pain, right      2. Cough  Suspect secondary to ACEI    3. Chronic hip pain, right    - oxyCODONE-acetaminophen (PERCOCET 10) 10-325 mg per tablet; Take 1 Tab  by mouth every six (6) hours as needed for Pain. Max Daily Amount: 4 Tabs.  Dispense: 60 Tab; Refill: 0  - REFERRAL TO PAIN MANAGEMENT    4. Severe hypertension  Stop lisinopril. Start Cozaar and aldactone  - losartan (COZAAR) 100 mg tablet; Take 1 Tab by mouth daily. Indications: hypertension  Dispense: 30 Tab; Refill: 2  - spironolactone (ALDACTONE) 100 mg tablet; Take 1 Tab by mouth daily.  Dispense: 30 Tab; Refill: 2    5. Hypomagnesemia    - magnesium oxide (MAG-OX) 400 mg tablet; Take 1 Tab by mouth daily.  Dispense: 30 Tab; Refill: 2        I have discussed the diagnosis with the patient and the intended management  The patient has received an after-visit summary and questions were answered concerning future plans.  I have discussed medication usage, side effects and warnings with the patient as well.I have reviewed the plan of care with the patient, accepted their input and they are in agreement with the treatment goals.         Follow-up Disposition:  Return in about 3 weeks (around 11/09/2015) for BP/ labs- nurse visit.      [place] Bells, MD                .

## 2015-10-19 NOTE — Progress Notes (Signed)
Anne ShutterMonica R [name] is a 65 y.o. female c/o R leg pain, no known injury. Also has a cough and headache

## 2015-10-19 NOTE — Patient Instructions (Addendum)
For hip/leg pain- I have refilled percocet for you today. Also I have referred you to Dr Jiles [facility]rervay at [facility]- pain management    For the blood pressure- Please stop the lisinopril as I believe this is causing your cough. Start Losartan 100mg  once daily and also Aldactone 100 mg once daily.    Please follow up in 3 weeks for a blood pressure check and lab test to check your electrolytes      High Blood Pressure: Care Instructions  Your Care Instructions  If your blood pressure is usually above 140/90, you have high blood pressure, or hypertension. That means the top number is 140 or higher or the bottom number is 90 or higher, or both.  Despite what a lot of people think, high blood pressure usually doesn't cause headaches or make you feel dizzy or lightheaded. It usually has no symptoms. But it does increase your risk for heart attack, stroke, and kidney or eye damage. The higher your blood pressure, the more your risk increases.  Your doctor will give you a goal for your blood pressure. Your goal will be based on your health and your age. An example of a goal is to keep your blood pressure below 140/90.  Lifestyle changes, such as eating healthy and being active, are always important to help lower blood pressure. You might also take medicine to reach your blood pressure goal.  Follow-up care is a key part of your treatment and safety. Be sure to make and go to all appointments, and call your doctor if you are having problems. It's also a good idea to know your test results and keep a list of the medicines you take.  How can you care for yourself at home?  Medical treatment  ?? If you stop taking your medicine, your blood pressure will go back up. You may take one or more types of medicine to lower your blood pressure. Be safe with medicines. Take your medicine exactly as prescribed. Call your doctor if you think you are having a problem with your medicine.   ?? Talk to your doctor before you start taking aspirin every day. Aspirin can help certain people lower their risk of a heart attack or stroke. But taking aspirin isn't right for everyone, because it can cause serious bleeding.  ?? See your doctor regularly. You may need to see the doctor more often at first or until your blood pressure comes down.  ?? If you are taking blood pressure medicine, talk to your doctor before you take decongestants or anti-inflammatory medicine, such as ibuprofen. Some of these medicines can raise blood pressure.  ?? Learn how to check your blood pressure at home.  Lifestyle changes  ?? Stay at a healthy weight. This is especially important if you put on weight around the waist. Losing even 10 pounds can help you lower your blood pressure.  ?? If your doctor recommends it, get more exercise. Walking is a good choice. Bit by bit, increase the amount you walk every day. Try for at least 30 minutes on most days of the week. You also may want to swim, bike, or do other activities.  ?? Avoid or limit alcohol. Talk to your doctor about whether you can drink any alcohol.  ?? Try to limit how much sodium you eat to less than 2,300 milligrams (mg) a day. Your doctor may ask you to try to eat less than 1,500 mg a day.  ?? Eat plenty of fruits (such  as bananas and oranges), vegetables, legumes, whole grains, and low-fat dairy products.  ?? Lower the amount of saturated fat in your diet. Saturated fat is found in animal products such as milk, cheese, and meat. Limiting these foods may help you lose weight and also lower your risk for heart disease.  ?? Do not smoke. Smoking increases your risk for heart attack and stroke. If you need help quitting, talk to your doctor about stop-smoking programs and medicines. These can increase your chances of quitting for good.  When should you call for help?  Call 911 anytime you think you may need emergency care. This may mean  having symptoms that suggest that your blood pressure is causing a serious heart or blood vessel problem. Your blood pressure may be over 180/110.  For example, call 911 if:  ?? You have symptoms of a heart attack. These may include:  ?? Chest pain or pressure, or a strange feeling in the chest.  ?? Sweating.  ?? Shortness of breath.  ?? Nausea or vomiting.  ?? Pain, pressure, or a strange feeling in the back, neck, jaw, or upper belly or in one or both shoulders or arms.  ?? Lightheadedness or sudden weakness.  ?? A fast or irregular heartbeat.  ?? You have symptoms of a stroke. These may include:  ?? Sudden numbness, tingling, weakness, or loss of movement in your face, arm, or leg, especially on only one side of your body.  ?? Sudden vision changes.  ?? Sudden trouble speaking.  ?? Sudden confusion or trouble understanding simple statements.  ?? Sudden problems with walking or balance.  ?? A sudden, severe headache that is different from past headaches.  ?? You have severe back or belly pain.  Do not wait until your blood pressure comes down on its own. Get help right away.  Call your doctor now or seek immediate care if:  ?? Your blood pressure is much higher than normal (such as 180/110 or higher), but you don't have symptoms.  ?? You think high blood pressure is causing symptoms, such as:  ?? Severe headache.  ?? Blurry vision.  Watch closely for changes in your health, and be sure to contact your doctor if:  ?? Your blood pressure measures 140/90 or higher at least 2 times. That means the top number is 140 or higher or the bottom number is 90 or higher, or both.  ?? You think you may be having side effects from your blood pressure medicine.  ?? Your blood pressure is usually normal, but it goes above normal at least 2 times.  Where can you learn more?  Go to [employer]Stats.ca.  Enter [phone] in the search box to learn more about "High Blood Pressure: Care Instructions."  Current as of: January 19, 2015   Content Version: 11.2  ?? 2006-2017 [employer]wise, [employer]. Care instructions adapted under license by [employer] (which disclaims liability or warranty for this information). If you have questions about a medical condition or this instruction, always ask your healthcare professional. Healthwise, Incorporated disclaims any warranty or liability for your use of this information.

## 2015-10-20 ENCOUNTER — Encounter

## 2015-10-20 MED ORDER — LOSARTAN 100 MG TAB
100 mg | ORAL_TABLET | Freq: Every day | ORAL | 0 refills | Status: DC
Start: 2015-10-20 — End: 2015-11-16

## 2015-10-20 MED ORDER — SPIRONOLACTONE 100 MG TAB
100 mg | ORAL_TABLET | Freq: Every day | ORAL | 0 refills | Status: DC
Start: 2015-10-20 — End: 2016-02-09

## 2015-10-20 MED ORDER — MAGNESIUM OXIDE 400 MG TAB
400 mg | ORAL_TABLET | Freq: Every day | ORAL | 2 refills | Status: DC
Start: 2015-10-20 — End: 2016-01-14

## 2015-10-20 NOTE — Progress Notes (Signed)
[facility] is requesting a 90 day supply for the patient's Losartan and Spironolactone.

## 2015-10-20 NOTE — Progress Notes (Signed)
Done

## 2015-10-21 NOTE — Telephone Encounter (Signed)
Pt's husband called a he has a concern about medications prescribed. The medications, he believes, will increase her potassium to questionable levels.

## 2015-10-22 ENCOUNTER — Encounter: Attending: Physical Medicine & Rehabilitation | Primary: Physician Assistant

## 2015-10-22 NOTE — Telephone Encounter (Signed)
Patient can stop taking her potassium supplement per verbal order; Dr Jac Canavanharakan. Husband and patient made aware.

## 2015-10-26 MED ORDER — CLONIDINE 0.3 MG TAB
0.3 mg | ORAL_TABLET | ORAL | 1 refills | Status: DC
Start: 2015-10-26 — End: 2016-05-10

## 2015-10-27 ENCOUNTER — Encounter: Attending: Family Medicine | Primary: Physician Assistant

## 2015-10-27 ENCOUNTER — Ambulatory Visit: Admit: 2015-10-27 | Discharge: 2015-10-27 | Payer: MEDICARE | Attending: Family Medicine | Primary: Physician Assistant

## 2015-10-27 DIAGNOSIS — I10 Essential (primary) hypertension: Secondary | ICD-10-CM

## 2015-10-27 MED ORDER — FLUTICASONE 50 MCG/ACTUATION NASAL SPRAY, SUSP
50 mcg/actuation | Freq: Every day | NASAL | 1 refills | Status: DC
Start: 2015-10-27 — End: 2015-12-22

## 2015-10-27 MED ORDER — BENZONATATE 200 MG CAP
200 mg | ORAL_CAPSULE | Freq: Three times a day (TID) | ORAL | 1 refills | Status: AC | PRN
Start: 2015-10-27 — End: 2015-11-03

## 2015-10-27 MED ORDER — KETOROLAC TROMETHAMINE 30 MG/ML INJECTION
30 mg/mL (1 mL) | Freq: Once | INTRAMUSCULAR | 0 refills | Status: AC
Start: 2015-10-27 — End: 2015-10-27

## 2015-10-27 NOTE — Progress Notes (Signed)
Hypertension Follow Up Progress Note      Today's Date:  10/27/2015   Patient's Name: [name] [name]   Patient's DOB:  1950/10/30     Subjective:     [name] [name] is a 65 y.o. female who presents for follow up of hypertension.    Diet and Lifestyle: generally follows a low sodium diet, sedentary, nonsmoker  Home BP Monitoring: is not measured at home    Cardiovascular ROS: taking medications as instructed, no medication side effects noted, no TIA's, no chest pain on exertion, no dyspnea on exertion, no swelling of ankles.     New concerns: Cough with phlegm.     Review of Systems:   Constitutional: negative for fevers and chills  Eyes: negative for visual disturbance  Respiratory: positive for cough  Cardiovascular: negative for chest pain, palpitations  Musculoskeletal:positive for chronic right hip pain  Neurological: negative for headaches and seizures      Hemoglobin A1c   Date Value Ref Range Status   07/08/2015 8.6 (H) 4.2 - 5.6 % Final     Comment:     (NOTE)  HbA1C Interpretive Ranges  <5.7              Normal  5.7 - 6.4         Consider Prediabetes  >6.5              Consider Diabetes       BUN   Date Value Ref Range Status   08/21/2015 14 7.0 - 18 MG/DL Final     Creatinine   Date Value Ref Range Status   08/21/2015 0.73 0.6 - 1.3 MG/DL Final     GFR est AA   Date Value Ref Range Status   08/21/2015 >60 >60 ml/min/1.21m2 Final     Hemoglobin A1c, External   Date Value Ref Range Status   01/08/2014 6.6  Final     Hemoglobin A1c   Date Value Ref Range Status   07/08/2015 8.6 (H) 4.2 - 5.6 % Final     Comment:     (NOTE)  HbA1C Interpretive Ranges  <5.7              Normal  5.7 - 6.4         Consider Prediabetes  >6.5              Consider Diabetes       BUN   Date Value Ref Range Status   08/21/2015 14 7.0 - 18 MG/DL Final     Creatinine   Date Value Ref Range Status   08/21/2015 0.73 0.6 - 1.3 MG/DL Final     GFR est AA   Date Value Ref Range Status   08/21/2015 >60 >60 ml/min/1.6m2 Final      Hemoglobin A1c, External   Date Value Ref Range Status   01/08/2014 6.6  Final       Objective:     Visit Vitals   ??? BP (!) 118/98 (BP 1 Location: Right arm, BP Patient Position: Sitting)   ??? Pulse 87   ??? Temp 97.5 ??F (36.4 ??C) (Oral)   ??? Resp 20   ??? Ht  (1.676 m)   ??? Wt 193 lb (87.5 kg)  Comment: patient unable to weigh   ??? SpO2 96%   ??? BMI 31.15 kg/m2     Appearance: alert, well appearing, and in no distress and oriented to person, place, and time.  ENT:  Marked nasal co  Cardiovascular: Normal rate and regular rhythm, no gallop, no murmur., S1, S2 normal and no JVD   Pulmonary/Chest: Effort normal and breath sounds normal. No respiratory distress. No wheezes or rales  Lower Extremities (feet): warm, good capillary refill  Lab review: orders written for new lab studies as appropriate; see orders.   CVS exam BP noted to be well controlled today in office,    Assessment/Plan:   hypertension stable.  current treatment plan is effective, no change in therapy  lab results and schedule of future lab studies reviewed with patient.   hypertension borderline controlled.  current treatment plan is effective, no change in therapy.     ICD-10-CM ICD-9-CM    1. Essential hypertension I10 401.9 METABOLIC PANEL, COMPREHENSIVE   2. Allergic rhinitis, unspecified allergic rhinitis trigger, unspecified rhinitis seasonality J30.9 477.9 fluticasone (FLONASE) 50 mcg/actuation nasal spray   3. Cough R05 786.2 benzonatate (TESSALON) 200 mg capsule   4. Chronic hip pain, right M25.551 719.45 ketorolac (TORADOL) 30 mg/mL (1 mL) injection    G89.29 338.29    5. Type 2 diabetes mellitus with hyperglycemia, without long-term current use of insulin (HCC) E11.65 250.00 HEMOGLOBIN A1C WITH EAG     790.29      Continue Losartan. Needs updated BMP. BP much improved. No change for now in current medications.    Cough likely secondary to nasal congestion. Start flonase nasal spray and use tessalon perles PRN     Follow up with Dr Elio ForgetHenick for pain management of Chronic Hip pain. Given Toradol IM today in office    Needs updated HgBA1c        Recommendations:  Limit sodium < 1500 mg/day  DASH diet  Wt reduction and maintenance  Exercise- 30 min 5 days/wk with intense 20 min 3 days/wk  Moderation of Alcohol intake: 1 serving/day, 5/wk -female, 2 servings/day  9/wk-female  Do not smoke  Avoid NSAIDs, pseudoephedrine, phenylephrine and herbal suppliments such as bitter orange, ginsing, st john's wort, licorice, caffeine pills.  Discussed with patient at length the need for blood pressure re-evaluation and management with primary care. Discussed the long term sequelae for elevated blood pressure including blindness, CVA, MI, and renal failure/ dialysis. Pt agrees to follow up as directed.     [name] K Charlisha Market, MD

## 2015-10-27 NOTE — Patient Instructions (Addendum)
Please use  Nasal steroid 2 sprays in each nostril once daily. Use tessalon perles for cough.    Please see Dr Elio ForgetHenick for continued pain management.    Come in tomorrow for lab draw and blood pressure check       Using a Nasal Steroid Spray: Care Instructions  Your Care Instructions    Your doctor may suggest using a corticosteroid nasal spray for your allergy symptoms or sinus problems.  These sprays reduce the swelling inside the nose and sinuses. Unlike decongestant nasal sprays, steroid sprays won't lead to more swelling when you stop taking them.  These sprays start working in a few days, but it may take several weeks before you get the full effect.  Most side effects are minor. The most common complaint is a burning feeling in the nose right after the spray is used. Some people get nosebleeds.  Follow-up care is a key part of your treatment and safety. Be sure to make and go to all appointments, and call your doctor if you are having problems. It's also a good idea to know your test results and keep a list of the medicines you take.  How can you care for yourself at home?  Here are some tips for using these sprays:  ?? You may need to prime the sprayer before you use it. This means spraying it into the air a few times to make sure you get the right amount of medicine. Follow the directions on the label.  ?? Blow your nose before you spray. This will help clear out your nostrils.  ?? Gently sniff the medicine into your nose as you spray. Don't snort, or the medicine will go all the way into your throat where it won't do much good.  ?? Aim the nozzle straight toward the outer wall of your nostril. This will help keep the medicine from irritating the inner walls of your nose, especially your septum (the wall that separates your left and right nostrils).  ?? Don't blow your nose for 10 minutes or so after you spray. And try not to sneeze.  ?? Be safe with medicines. Use this medicine exactly as prescribed. Call  your doctor if you think you are having a problem with your medicine.  ?? Clean your sprayer once a week. Read the label to learn how.  When should you call for help?  Call your doctor now or seek immediate medical care if:  ?? You don't understand how to use the medicine.  ?? Your symptoms aren't getting better as expected.  ?? You think you are having a side effect from the medicine.  Where can you learn more?  Go to [employer]Stats.cahttp://www.healthwise.net/[employer].  Enter ([phone])[phone] in the search box to learn more about "Using a Nasal Steroid Spray: Care Instructions."  Current as of: March 03, 2015  Content Version: 11.2  ?? 2006-2017 [employer], Inc[employer]. Care instructions adapted under license by [employer] (which disclaims liability or warranty for this information). If you have questions about a medical condition or this instruction, always ask your healthcare professional. Healthwise, Incorporated disclaims any warranty or liability for your use of this information.

## 2015-10-28 ENCOUNTER — Encounter

## 2015-10-28 NOTE — Telephone Encounter (Signed)
Pt's husband called to see if Clonidine has been refilled, advised him that it has and was sent to [facility] on [address].

## 2015-11-02 MED ORDER — GABAPENTIN 600 MG TAB
600 mg | ORAL_TABLET | ORAL | 2 refills | Status: DC
Start: 2015-11-02 — End: 2016-08-01

## 2015-11-02 MED ORDER — QUETIAPINE SR 150 MG 24 HR TAB
150 mg | ORAL_TABLET | ORAL | 1 refills | Status: DC
Start: 2015-11-02 — End: 2016-02-25

## 2015-11-03 NOTE — Telephone Encounter (Signed)
Patient called wondering if she could be referred to the same pain management as her husband.

## 2015-11-03 NOTE — Telephone Encounter (Signed)
I would recommend she continue to see Dr Elio ForgetHenick as establishing with a new pain management doctor is a long, drawn out process.    [name] K Sabah Zucco, MD

## 2015-11-04 NOTE — Telephone Encounter (Signed)
She has seen Dr Elio ForgetHenick before. I suggested she continue with seeing him as it would delay her treatment to try to find a new pain specialist.    [name] DeedsJolson [name], MD

## 2015-11-04 NOTE — Telephone Encounter (Signed)
Patient aware of message. She stated she has never seen Dr. Elio ForgetHenick but there is an encounter where she did in fact go. She said the only doctor she sees is Dr. Jac Canavanharakan.

## 2015-11-13 ENCOUNTER — Encounter

## 2015-11-13 NOTE — Telephone Encounter (Signed)
Pt did not leave a reason for call but is requesting a call back.

## 2015-11-13 NOTE — Telephone Encounter (Signed)
Patient aware that she needs to contact her pain management provider.

## 2015-11-16 ENCOUNTER — Encounter

## 2015-11-16 MED ORDER — LOSARTAN 100 MG TAB
100 mg | ORAL_TABLET | Freq: Every day | ORAL | 0 refills | Status: DC
Start: 2015-11-16 — End: 2016-02-10

## 2015-11-17 ENCOUNTER — Encounter

## 2015-11-17 ENCOUNTER — Inpatient Hospital Stay
Admit: 2015-11-17 | Discharge: 2015-11-17 | Disposition: A | Discharge status: Home / Self Care | Payer: MEDICARE | Attending: Emergency Medicine

## 2015-11-17 ENCOUNTER — Emergency Department: Admit: 2015-11-17 | Payer: MEDICARE | Primary: Physician Assistant

## 2015-11-17 DIAGNOSIS — M25551 Pain in right hip: Secondary | ICD-10-CM

## 2015-11-17 MED ORDER — MORPHINE 4 MG/ML SYRINGE
4 mg/mL | INTRAMUSCULAR | Status: AC
Start: 2015-11-17 — End: 2015-11-17
  Administered 2015-11-17: 17:00:00 via INTRAMUSCULAR

## 2015-11-17 MED FILL — MORPHINE 4 MG/ML SYRINGE: 4 mg/mL | INTRAMUSCULAR | Qty: 1

## 2015-11-17 NOTE — ED Triage Notes (Addendum)
Pt has right hip pain. Pt is scheduled to see pain management but has not seen them yet. Pt had a hip replacement last august and has intermittent pain, called pain management but they havent called back, pt still waiting to get in with pain management,

## 2015-11-17 NOTE — Telephone Encounter (Signed)
Spoke with patient, she reports going to the ER for treatment of hip pain.

## 2015-11-17 NOTE — Telephone Encounter (Signed)
Pt left message with Andrey CampanileSandy that she need pain meds.  I called pt and was told she went to the hospital.  12:29pm

## 2015-11-17 NOTE — Telephone Encounter (Signed)
Patient called this morning stating she needed and MRI. When I asked her who told her that, she said Dr. Elio ForgetHenick. I told her I would call them and see what was going on and call her back. After looking in her chart, Dr. Elio ForgetHenick did order and MRI of the lumbar spine 09-15-2015. Notes also showed that the patient was called but stated she did not want to schedule the test at that time. I called the patient and told her what I had found and that if she wanted me to, I could refax the order for her. She was agreeable and said she would [facility]. Order was refaxed. Patient immediately called back and asked for a refill on her Percocet. After speaking to Dr. Jac Canavanharakan, he said to have patient ask Dr. Elio ForgetHenick for refills on that medication. Patient was called back and this information was relayed to her. She said she would call and ask them. Within minutes, patient called back asking me to call them because she was not able to get through. She then asked if Dr. Jac Canavanharakan would give her a shot of Toradol  because she is in so much pain. She is aware that Dr. Jac Canavanharakan is in with a patient at this time and will address it later. Patient was very tearful on the phone.

## 2015-11-17 NOTE — Telephone Encounter (Signed)
Please have her call Dr Henick's office for continued pain management. If he feels that a toradol shot is appropriate he can then [facility] and it can be administered here as a nurse visit.    Thanks,  [name] K Wynona Duhamel, MD

## 2015-11-17 NOTE — ED Provider Notes (Signed)
[facility]  Emergency Department Treatment Report    Patient: [name] [name] Age: 65 y.o. Sex: female    Date of Birth: [birth year] Admit Date: 11/17/2015 PCP: Lake Bells, MD   MRN: [id number]  CSN: [id number]     Room: [phone] Time Dictated: 12:31 PM      Chief Complaint   Right hip pain  History of Present Illness   65 y.o. female  with right hip pain intermittent, flares with worsening pain this morning. Patient states that he inadvertently walked into a wall bumping her head. She now has pain thus a 9 out of 10 constant worse with range of motion of the hip. She is chronically on Percocet for pain control currently awaiting a follow-up appointment with pain management. She was unable to follow-up with her primary care doctor today.     Review of Systems   Constitutional: No fever, chills, or weight loss  Eyes: No visual symptoms.  ENT: No sore throat, runny nose or ear pain.  Respiratory: No cough, dyspnea or wheezing.  Cardiovascular: No chest pain, pressure, palpitations, tightness or heaviness.  Gastrointestinal: No vomiting, diarrhea or abdominal pain.  Genitourinary: No dysuria, frequency, or urgency.  Musculoskeletal: Right hip pain  Integumentary: No rashes.  Neurological: No headaches, sensory or motor symptoms.  Denies complaints in all other systems.    Past Medical/Surgical History     Past Medical History:   Diagnosis Date   ??? Arthritis    ??? Chronic pain    ??? Diabetes ([place])    ??? GERD (gastroesophageal reflux disease)    ??? Hypertension    ??? Liver disease     HEP C   ??? PTSD (post-traumatic stress disorder)     lived through the [place] in 1993     Past Surgical History:   Procedure Laterality Date   ??? HX ENDOSCOPY     ??? HX GYN  2010    hysterectomy   ??? HX HIP REPLACEMENT Right 01/29/2015   ??? HX HIP REPLACEMENT         Social History     Social History     Social History   ??? Marital status: MARRIED     Spouse name: N/A   ??? Number of children: N/A    ??? Years of education: N/A     Social History Main Topics   ??? Smoking status: Never Smoker   ??? Smokeless tobacco: Never Used   ??? Alcohol use No   ??? Drug use: No   ??? Sexual activity: No     Other Topics Concern   ??? None     Social History Narrative       Family History     Family History   Problem Relation Age of Onset   ??? Hypertension Mother    ??? Cancer Maternal Aunt    ??? Alcohol abuse Maternal Grandfather        Current Medications     Prior to Admission medications    Medication Sig Start Date End Date Taking? Authorizing Provider   losartan (COZAAR) 100 mg tablet Take 1 Tab by mouth daily. Indications: hypertension 11/16/15  Yes Jolson Dyann Kief, MD   QUEtiapine SR (SEROQUEL XR) 150 mg sr tablet TAKE 1 TABLET BY MOUTH NIGHTLY 11/02/15  Yes Jolson Dyann Kief, MD   gabapentin (NEURONTIN) 600 mg tablet TAKE 1 TABLET BY MOUTH THREE TIMES DAILY 11/02/15  Yes Lake Bells, MD  fluticasone (FLONASE) 50 mcg/actuation nasal spray 2 Sprays by Both Nostrils route daily. 10/27/15  Yes Jolson Dyann Kief, MD   cloNIDine HCl (CATAPRES) 0.3 mg tablet TAKE ONE TABLET BY MOUTH TWICE DAILY FOR 30  DAYS 10/26/15  Yes Jolson Dyann Kief, MD   magnesium oxide (MAG-OX) 400 mg tablet Take 1 Tab by mouth daily. 10/20/15  Yes Jolson Dyann Kief, MD   spironolactone (ALDACTONE) 100 mg tablet Take 1 Tab by mouth daily. 10/20/15  Yes Lake Bells, MD   oxyCODONE-acetaminophen (PERCOCET 10) 10-325 mg per tablet Take 1 Tab by mouth every six (6) hours as needed for Pain. Max Daily Amount: 4 Tabs. 10/19/15  Yes Lake Bells, MD   metFORMIN ER (GLUCOPHAGE XR) 750 mg tablet Take 2 Tabs by mouth daily. 10/06/15  Yes Lake Bells, MD   traZODone (DESYREL) 50 mg tablet TAKE ONE TABLET BY MOUTH AT BEDTIME 08/28/15  Yes Jolson Dyann Kief, MD   diclofenac EC (VOLTAREN) 75 mg EC tablet Take 1 Tab by mouth two (2) times daily as needed. 08/27/15  Yes Rollene Rotunda, MD   amLODIPine (NORVASC) 10 mg tablet Take 1 Tab by mouth daily. 07/27/15  Yes  Jolson Dyann Kief, MD   methocarbamol (ROBAXIN) 750 mg tablet Take 1 Tab by mouth three (3) times daily. 07/23/15  Yes Annalyn R Abcede, PA   Blood-Glucose Meter monitoring kit Check sugars 2 times per day, fasting and 2 hours after dinner 07/15/15  Yes Lake Bells, MD   Lancets misc Check sugars twice daily 07/15/15  Yes Jolson Dyann Kief, MD   glucose blood VI test strips (ASCENSIA AUTODISC VI, ONE TOUCH ULTRA TEST VI) strip Check sugars twice daily 07/15/15  Yes Jolson Dyann Kief, MD   albuterol (PROVENTIL HFA, VENTOLIN HFA, PROAIR HFA) 90 mcg/actuation inhaler Take 2 Puffs by inhalation every six (6) hours as needed for Wheezing or Shortness of Breath. 06/16/15  Yes Rosine Marlynn Perking, FNP       Allergies     Allergies   Allergen Reactions   ??? Chocolate [Cocoa] Sneezing       Physical Exam     ED Triage Vitals   Enc Vitals Group      BP 11/17/15 1214 143/98      Pulse (Heart Rate) 11/17/15 1212 98      Resp Rate 11/17/15 1212 18      Temp 11/17/15 1212 97.8 ??F (36.6 ??C)      O2 Sat (%) 11/17/15 1212 100 %      Weight 11/17/15 1212 191 lb     Constitutional: Patient appears well developed and well nourished. Marland Kitchen Appearance and behavior are age and situation appropriate.  HEENT: Conjunctiva clear.  PERRL. Mucous membranes moist, non-erythematous.  Surface of the pharynx, palate, and tongue are pink, moist and without lesions.  Neck: supple, non tender, symmetrical, no masses or JVD.   Respiratory: lungs clear to auscultation, nonlabored respirations. No tachypnea or accessory muscle use.  Cardiovascular: heart regular rate and rhythm without murmur rubs or gallops.   Calves soft and non-tender. Distal pulses 2+ and equal bilaterally.  No peripheral edema or significant variscosities.    Gastrointestinal:  Abdomen soft, nontender without complaint of pain to palpation  Musculoskeletal: Patient without any edema erythema or hyperthermia of the joint. Patient has a well-healed incisional scar to the anterior right  hip. Resistances gets gravity day right lower extremity  Integumentary: warm and dry without rashes or lesions  Neurologic: alert and oriented, Sensation intact, motor strength equal and symmetric.  No facial asymmetry or dysarthria.    Impression and Management Plan     65 year old afebrile nontoxic female who presents emergency Department with right hip pain. She does not have any signs of septic joint: [place] edema erythema hypothermia. We'll order an x-ray to ensure that there is no hardware failure abnormalities. Patient is afebrile nontoxic non-tachycardic further decreases suspicion of systemic infection.    Diagnostic Studies       Imaging:    X-ray Right Hip:  No acute abnormality per radiology    ED Course   Continuation by Sedonia Small, MD:  I, Dr. Sedonia Small, have personally seen and examined this patient; I have fully participated in the care of this patient with the advanced practice provider.  I have reviewed and agree with all pertinent clinical information including history, physical exam, radiographic studies and the plan.  I have also reviewed and agree with the medications, allergies and past medical history sections for this patient.  On exam, tender right hip with no palpable bony abnormalities. Patient received morphine for pain.      Medical Decision Making   Acute exacerbation of chronic right hip pain with negative x-ray patient treated symptomatically in the emergency department instructed to follow-up with primary care for further medications  Final Diagnosis     Right hip pain  Disposition   Home    Joice Lofts, MD  November 26, 2015    Dawayne Patricia, MPA, PA-C  November 17, 2015      The patient was personally evaluated by myself and Dr. Sonny Masters who agrees with the above assessment and plan.    My signature above authenticates this document and my orders, the final ??  diagnosis (es), discharge prescription (s), and instructions in the Epic ??  record.   If you have any questions please contact [phone].  ??  Nursing notes have been reviewed by the physician/ advanced practice ??  Clinician.

## 2015-11-17 NOTE — ED Notes (Signed)
Patient given discharge instructions. Verbalizes understanding.

## 2015-11-17 NOTE — Telephone Encounter (Signed)
Patient went to ED.

## 2015-11-17 NOTE — Telephone Encounter (Signed)
Pt called.

## 2015-11-18 NOTE — Telephone Encounter (Signed)
Noted. Please ask to follow up with Dr Elio ForgetHenick, pain management.    [name] K Jeret Goyer, MD

## 2015-11-19 NOTE — Telephone Encounter (Signed)
Patient has a scheduled appointment with Dr. Elio ForgetHenick on November 25, 2015.

## 2015-11-20 NOTE — Progress Notes (Signed)
Chart review for recent mammogram.

## 2015-11-21 ENCOUNTER — Inpatient Hospital Stay
Admit: 2015-11-21 | Discharge: 2015-11-21 | Disposition: A | Discharge status: Home / Self Care | Payer: MEDICARE | Attending: Emergency Medical Services

## 2015-11-21 ENCOUNTER — Emergency Department: Admit: 2015-11-21 | Payer: MEDICARE | Primary: Physician Assistant

## 2015-11-21 DIAGNOSIS — M5431 Sciatica, right side: Secondary | ICD-10-CM

## 2015-11-21 LAB — CBC WITH AUTOMATED DIFF
BASOPHILS: 0.4 % (ref 0–3)
EOSINOPHILS: 2.1 % (ref 0–5)
HCT: 43.8 % (ref 37.0–50.0)
HGB: 14.8 gm/dl (ref 13.0–17.2)
IMMATURE GRANULOCYTES: 0.2 % (ref 0.0–3.0)
LYMPHOCYTES: 29.6 % (ref 28–48)
MCH: 28.6 pg (ref 25.4–34.6)
MCHC: 33.8 gm/dl (ref 30.0–36.0)
MCV: 84.7 fL (ref 80.0–98.0)
MONOCYTES: 5.9 % (ref 1–13)
MPV: 11.2 fL — ABNORMAL HIGH (ref 6.0–10.0)
NEUTROPHILS: 61.8 % (ref 34–64)
NRBC: 0 (ref 0–0)
PLATELET: 251 10*3/uL (ref 140–450)
RBC: 5.17 M/uL (ref 3.60–5.20)
RDW-SD: 42.5 (ref 36.4–46.3)
WBC: 9.8 10*3/uL (ref 4.0–11.0)

## 2015-11-21 LAB — METABOLIC PANEL, BASIC
BUN: 22 mg/dl (ref 7–25)
CO2: 31 mEq/L (ref 21–32)
Calcium: 9.6 mg/dl (ref 8.5–10.1)
Chloride: 103 mEq/L (ref 98–107)
Creatinine: 0.9 mg/dl (ref 0.6–1.3)
GFR est AA: >60
GFR est non-AA: >60
Glucose: 164 mg/dl — ABNORMAL HIGH (ref 74–106)
Potassium: 3.9 mEq/L (ref 3.5–5.1)
Sodium: 141 mEq/L (ref 136–145)

## 2015-11-21 MED ORDER — OXYCODONE-ACETAMINOPHEN 5 MG-325 MG TAB
5-325 mg | ORAL | Status: AC
Start: 2015-11-21 — End: 2015-11-21
  Administered 2015-11-21: 14:00:00 via ORAL

## 2015-11-21 MED ORDER — PREDNISONE 20 MG TAB
20 mg | ORAL_TABLET | Freq: Every day | ORAL | 0 refills | Status: DC
Start: 2015-11-21 — End: 2015-11-23

## 2015-11-21 MED ORDER — BENZONATATE 100 MG CAP
100 mg | ORAL_CAPSULE | Freq: Three times a day (TID) | ORAL | 0 refills | Status: AC | PRN
Start: 2015-11-21 — End: 2015-11-28

## 2015-11-21 MED ORDER — PREDNISONE 20 MG TAB
20 mg | ORAL | Status: AC
Start: 2015-11-21 — End: 2015-11-21
  Administered 2015-11-21: 14:00:00 via ORAL

## 2015-11-21 MED FILL — OXYCODONE-ACETAMINOPHEN 5 MG-325 MG TAB: 5-325 mg | ORAL | Qty: 1

## 2015-11-21 MED FILL — PREDNISONE 20 MG TAB: 20 mg | ORAL | Qty: 2

## 2015-11-21 NOTE — ED Provider Notes (Signed)
[facility]  Emergency Department Treatment Report    Patient: [name] [name] Age: 65 y.o. Sex: female    Date of Birth: 1951-05-31 Admit Date: 11/21/2015 PCP: Lake Bells, MD   MRN: [id number]  CSN: [id number]     Room: ER05/ER05 Time Dictated: 9:37 AM           Chief Complaint   Chief Complaint   Patient presents with   ??? Hip Pain     right hip pain since Surgery in August   ??? Cough     x 2 weeks - non- productive       History of Present Illness   65 y.o. female who complains of right hip pain which is been present intermittently for approximately one year. Patient's discomfort is localized to the right lateral hip and radiates into the right lower extremity to the knee. Patient was seen at our facility earlier this week and had an x-ray performed there revealed evidence of a normal-appearing hip prosthesis without loosening. Patient denies any bowel/bladder dysfunction and denies associated specific weakness. She does complain of a cough which been protracted over the course of the last 2 weeks. This is not associated with fever or chills, shortness of breath or hemoptysis.    Review of Systems   Constitutional: No fever, chills, or weight loss  Eyes: No visual symptoms.  ENT: No sore throat, runny nose or ear pain.  Respiratory: Protracted cough as above.  Cardiovascular: No chest pain, pressure, palpitations, tightness or heaviness.  Gastrointestinal: No vomiting, diarrhea or abdominal pain.  Genitourinary: No dysuria, frequency, or urgency.  Musculoskeletal: Right hip pain which radiates into the right lower extremity.  Integumentary: No rashes.  Neurological: No headaches, sensory or motor symptoms.  Denies complaints in all other systems.    Past Medical/Surgical History     Past Medical History:   Diagnosis Date   ??? Arthritis    ??? Chronic pain    ??? Diabetes ([place])    ??? GERD (gastroesophageal reflux disease)    ??? Hypertension    ??? Liver disease     HEP C    ??? PTSD (post-traumatic stress disorder)     lived through the [place] in 1993     Past Surgical History:   Procedure Laterality Date   ??? HX ENDOSCOPY     ??? HX GYN  2010    hysterectomy   ??? HX HIP REPLACEMENT Right 01/29/2015   ??? HX HIP REPLACEMENT         Social History     Social History     Social History   ??? Marital status: MARRIED     Spouse name: N/A   ??? Number of children: N/A   ??? Years of education: N/A     Social History Main Topics   ??? Smoking status: Never Smoker   ??? Smokeless tobacco: Never Used   ??? Alcohol use No   ??? Drug use: No   ??? Sexual activity: No     Other Topics Concern   ??? None     Social History Narrative       Family History     Family History   Problem Relation Age of Onset   ??? Hypertension Mother    ??? Cancer Maternal Aunt    ??? Alcohol abuse Maternal Grandfather        Current Medications     Prior to Admission Medications   Prescriptions  Last Dose Informant Patient Reported? Taking?   Blood-Glucose Meter monitoring kit   No Yes   Sig: Check sugars 2 times per day, fasting and 2 hours after dinner   Lancets misc   No Yes   Sig: Check sugars twice daily   QUEtiapine SR (SEROQUEL XR) 150 mg sr tablet   No Yes   Sig: TAKE 1 TABLET BY MOUTH NIGHTLY   albuterol (PROVENTIL HFA, VENTOLIN HFA, PROAIR HFA) 90 mcg/actuation inhaler   No Yes   Sig: Take 2 Puffs by inhalation every six (6) hours as needed for Wheezing or Shortness of Breath.   amLODIPine (NORVASC) 10 mg tablet   No Yes   Sig: Take 1 Tab by mouth daily.   cloNIDine HCl (CATAPRES) 0.3 mg tablet   No Yes   Sig: TAKE ONE TABLET BY MOUTH TWICE DAILY FOR 30  DAYS   diclofenac EC (VOLTAREN) 75 mg EC tablet   No Yes   Sig: Take 1 Tab by mouth two (2) times daily as needed.   fluticasone (FLONASE) 50 mcg/actuation nasal spray   No Yes   Sig: 2 Sprays by Both Nostrils route daily.   gabapentin (NEURONTIN) 600 mg tablet   No Yes   Sig: TAKE 1 TABLET BY MOUTH THREE TIMES DAILY    glucose blood VI test strips (ASCENSIA AUTODISC VI, ONE TOUCH ULTRA TEST VI) strip   No Yes   Sig: Check sugars twice daily   losartan (COZAAR) 100 mg tablet   No Yes   Sig: Take 1 Tab by mouth daily. Indications: hypertension   magnesium oxide (MAG-OX) 400 mg tablet   No Yes   Sig: Take 1 Tab by mouth daily.   metFORMIN ER (GLUCOPHAGE XR) 750 mg tablet   No Yes   Sig: Take 2 Tabs by mouth daily.   methocarbamol (ROBAXIN) 750 mg tablet   No Yes   Sig: Take 1 Tab by mouth three (3) times daily.   oxyCODONE-acetaminophen (PERCOCET 10) 10-325 mg per tablet   No Yes   Sig: Take 1 Tab by mouth every six (6) hours as needed for Pain. Max Daily Amount: 4 Tabs.   spironolactone (ALDACTONE) 100 mg tablet   No Yes   Sig: Take 1 Tab by mouth daily.   traZODone (DESYREL) 50 mg tablet   No Yes   Sig: TAKE ONE TABLET BY MOUTH AT BEDTIME      Facility-Administered Medications: None       Allergies     Allergies   Allergen Reactions   ??? Chocolate [Cocoa] Sneezing       Physical Exam   ED Triage Vitals   Enc Vitals Group      BP 11/21/15 0906 150/107      Pulse (Heart Rate) 11/21/15 0906 100      Resp Rate 11/21/15 0906 20      Temp 11/21/15 0906 98.8 ??F (37.1 ??C)      Temp src --       O2 Sat (%) 11/21/15 0906 100 %      Weight 11/21/15 0906 191 lb      Height 11/21/15 0906 5' 7"       Head Cir --       Peak Flow --       Pain Score --       Pain Loc --       Pain Edu? --       Excl. in [place]? --  Constitutional: Patient appears well developed and well nourished. Marland Kitchen Appearance and behavior are age and situation appropriate.  HEENT: Conjunctivae clear.  PERRL. Mucous membranes moist, non-erythematous. Surface of the pharynx, palate, and tongue are pink, moist and without lesions.  Neck: supple, non tender, symmetrical, no masses or JVD.   Respiratory: Breath sounds are generally clear. Patient had a decrease in breath sounds heard at the left base.  Cardiovascular: heart regular rate and rhythm without murmur rubs or gallops.    Calves soft and non-tender. Distal pulses 2+ and equal bilaterally.  No peripheral edema or significant variscosities.    Gastrointestinal: Soft completely non-tender.  Musculoskeletal: Nail beds pink with prompt capillary refill  Integumentary: warm and dry without rashes or lesions  Neurologic: alert and oriented, Sensation intact, motor strength equal and symmetric.  No facial asymmetry or dysarthria.    Impression and Management Plan     Patient presents with intermittent pain in the right hip sounds radicular and is likely secondary to sciatica. X-rays were recently performed and these will not be repeated. She will be considered for oral steroid therapy and her diabetic status will be assessed. With her protracted cough, chest x-ray will be performed as well. The patient's disposition will be determined based on her diagnostic studies and her continued clinical progress.    Diagnostic Studies   Lab:   Labs Reviewed   CBC WITH AUTOMATED DIFF - Abnormal; Notable for the following:        Result Value    MPV 11.2 (*)     All other components within normal limits   METABOLIC PANEL, BASIC - Abnormal; Notable for the following:     Glucose 164 (*)     All other components within normal limits     Imaging:    CXR Results  (Last 48 hours)               11/21/15 0955  XR CHEST PA LAT Final result    Impression:  IMPRESSION:   1. No acute pulmonary process.   2. 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   3. Left lower lobe nodule.           Narrative:  Clinical history: Protracted cough       EXAMINATION: PA and lateral views of the chest 11/21/2015       Correlation: Chest radiograph 08/19/2015, CT scan 07/03/2015       FINDINGS:   Trachea and heart size are within normal limits. Aorta is mildly tortuous.   Bilateral upper lobe bullae resulting in compressive atelectasis. Left lower   lobe nodule also seen on prior CT scan and chest radiograph.                 ED Course      The patient remained stable while in the department. Patient was advised as to her elevated blood sugar however was thought to be appropriate candidate for prednisone therapy. She was advised as to the nodules and was placed on Tessalon for the protracted cough. She will follow with her primary physician understands that MRI or lumbar spine may be needed if her right lower extremity discomfort continues.    Final Diagnosis       ICD-10-CM ICD-9-CM   1. Sciatica of right side M54.31 724.3   2. Cough R05 786.2     Disposition     The patient will return to this department at any  point for an increase or change in her symptoms and will follow with her primary physician as needed for further care.        Lorenz Coaster, MD  November 21, 2015    My signature above authenticates this document and my orders, the final ??  diagnosis (es), discharge prescription (s), and instructions in the Epic ??  record.  If you have any questions please contact [phone].  ??  Nursing notes have been reviewed by the physician/ advanced practice ??  Clinician.    Dragon medical dictation software was used for portions of this report.  Unintended voice transcription errors may occur.

## 2015-11-21 NOTE — ED Triage Notes (Signed)
right hip pain since Surgery in August

## 2015-11-21 NOTE — ED Notes (Signed)
12:09 PM  11/21/15     Discharge instructions given to patient (name) with verbalization of understanding; pt states she is "pissed off" that she didn't get any pain meds and stated she was "going to hit him" while referring to Dr. Cipriano MilePitrolo.  Dr. Cipriano MilePitrolo notified and patient asked to not threaten staff and leave the unit. Patient accompanied by self.  Patient discharged with the following prescriptions (prednisone, tussionex).  Patient discharged to home and ambulated off unit without incident   Teofilo PodMegan Kinlaw, RN

## 2015-11-23 ENCOUNTER — Encounter

## 2015-11-23 ENCOUNTER — Ambulatory Visit: Admit: 2015-11-23 | Discharge: 2015-11-23 | Payer: MEDICARE | Attending: Family Medicine | Primary: Physician Assistant

## 2015-11-23 DIAGNOSIS — G8929 Other chronic pain: Secondary | ICD-10-CM

## 2015-11-23 MED ORDER — KETOROLAC TROMETHAMINE 30 MG/ML INJECTION
30 mg/mL (1 mL) | Freq: Once | INTRAMUSCULAR | 0 refills | Status: AC
Start: 2015-11-23 — End: 2015-11-23

## 2015-11-23 MED ORDER — HYDROXYZINE PAMOATE 50 MG CAP
50 mg | ORAL_CAPSULE | Freq: Three times a day (TID) | ORAL | 1 refills | Status: AC | PRN
Start: 2015-11-23 — End: 2015-12-07

## 2015-11-23 MED ORDER — METOPROLOL SUCCINATE SR 25 MG 24 HR TAB
25 mg | ORAL_TABLET | Freq: Every day | ORAL | 1 refills | Status: DC
Start: 2015-11-23 — End: 2015-12-18

## 2015-11-23 MED ORDER — TRAZODONE 100 MG TAB
100 mg | ORAL_TABLET | Freq: Every evening | ORAL | 0 refills | Status: DC
Start: 2015-11-23 — End: 2016-02-18

## 2015-11-23 NOTE — Progress Notes (Signed)
Hospital Follow Up        HPI: [name] [name] is a 65 y.o. female [place] here in follow up of 2 ER visits this past week secondary to her chronic right hip pain.  She reports the pain is severe, 8/10 today. She has scheduled follow up on Thursday with Dr Belia Heman. She presents in a wheel chair today due to the pain.    She reports over the last 4 days she has been feeling more anxious. She reportedly has not been taking her seroquel as she says she has run out.  Although a prescription was sent to the pharmacy on 5/22 for 90 days. Nothing that she can think of that is worsening her anxiety other than the hip pain.      She has been having a right sided headache over these days as well. She has not been sleeping mostly due to the hip pain. She has been using her trazodone but reports that it is like "taking candy" and does not help her sleep at all.     Past Medical History:   Diagnosis Date   ??? Arthritis    ??? Chronic pain    ??? Diabetes ([place])    ??? GERD (gastroesophageal reflux disease)    ??? Hypertension    ??? Liver disease     HEP C   ??? PTSD (post-traumatic stress disorder)     lived through the [place] in 1993       Current Outpatient Prescriptions   Medication Sig   ??? metoprolol succinate (TOPROL-XL) 25 mg XL tablet Take 1 Tab by mouth daily.   ??? ketorolac (TORADOL) 30 mg/mL (1 mL) injection 2 mL by IntraMUSCular route once for 1 dose.   ??? traZODone (DESYREL) 100 mg tablet Take 1 Tab by mouth nightly.   ??? losartan (COZAAR) 100 mg tablet Take 1 Tab by mouth daily. Indications: hypertension   ??? QUEtiapine SR (SEROQUEL XR) 150 mg sr tablet TAKE 1 TABLET BY MOUTH NIGHTLY   ??? gabapentin (NEURONTIN) 600 mg tablet TAKE 1 TABLET BY MOUTH THREE TIMES DAILY   ??? fluticasone (FLONASE) 50 mcg/actuation nasal spray 2 Sprays by Both Nostrils route daily.   ??? cloNIDine HCl (CATAPRES) 0.3 mg tablet TAKE ONE TABLET BY MOUTH TWICE DAILY FOR 30  DAYS    ??? magnesium oxide (MAG-OX) 400 mg tablet Take 1 Tab by mouth daily.   ??? spironolactone (ALDACTONE) 100 mg tablet Take 1 Tab by mouth daily.   ??? metFORMIN ER (GLUCOPHAGE XR) 750 mg tablet Take 2 Tabs by mouth daily.   ??? diclofenac EC (VOLTAREN) 75 mg EC tablet Take 1 Tab by mouth two (2) times daily as needed.   ??? amLODIPine (NORVASC) 10 mg tablet Take 1 Tab by mouth daily.   ??? methocarbamol (ROBAXIN) 750 mg tablet Take 1 Tab by mouth three (3) times daily.   ??? Blood-Glucose Meter monitoring kit Check sugars 2 times per day, fasting and 2 hours after dinner   ??? Lancets misc Check sugars twice daily   ??? glucose blood VI test strips (ASCENSIA AUTODISC VI, ONE TOUCH ULTRA TEST VI) strip Check sugars twice daily   ??? albuterol (PROVENTIL HFA, VENTOLIN HFA, PROAIR HFA) 90 mcg/actuation inhaler Take 2 Puffs by inhalation every six (6) hours as needed for Wheezing or Shortness of Breath.   ??? benzonatate (TESSALON PERLES) 100 mg capsule Take 1 Cap by mouth three (3) times daily as needed for  Cough for up to 7 days.   ??? oxyCODONE-acetaminophen (PERCOCET 10) 10-325 mg per tablet Take 1 Tab by mouth every six (6) hours as needed for Pain. Max Daily Amount: 4 Tabs.     No current facility-administered medications for this visit.        Allergies   Allergen Reactions   ??? Chocolate [Cocoa] Sneezing       Past Surgical History:   Procedure Laterality Date   ??? HX ENDOSCOPY     ??? HX GYN  2010    hysterectomy   ??? HX HIP REPLACEMENT Right 01/29/2015   ??? HX HIP REPLACEMENT         Family History   Problem Relation Age of Onset   ??? Hypertension Mother    ??? Cancer Maternal Aunt    ??? Alcohol abuse Maternal Grandfather        Social History     Social History   ??? Marital status: MARRIED     Spouse name: N/A   ??? Number of children: N/A   ??? Years of education: N/A     Occupational History   ??? Not on file.     Social History Main Topics   ??? Smoking status: Never Smoker   ??? Smokeless tobacco: Never Used   ??? Alcohol use No   ??? Drug use: No    ??? Sexual activity: No     Other Topics Concern   ??? Not on file     Social History Narrative       Gen- No weight loss, No Malaise, No fatigue  Eyes- No dipoplia, blurry vision  CVS- No Chest pain, no palpitations, no edema  Resp- No Cough, SOB, DOE, Wheezing  Neuro- No seizure activity  Skin- No easy bruising, No rashes      Visit Vitals   ??? BP (!) 148/115 (BP 1 Location: Right arm, BP Patient Position: Sitting)   ??? Pulse (!) 120   ??? Temp 98.4 ??F (36.9 ??C) (Oral)   ??? Resp 18   ??? Ht 5' 7" (1.702 m)   ??? Wt 193 lb 9.6 oz (87.8 kg)   ??? SpO2 96%   ??? BMI 30.32 kg/m2       GEN- NAD, AAOx3  CVS- RRR, +S1, +S2, No Murmurs, No JVD  PULM- CTABL, No W/R/R  EXT- No edema  NEURO-Normal Gait  PSYCH- Euthymic, normal afffect        Assesment:  1. Chronic hip pain, right  Toradol shot today in office. Follow up with Dr Belia Heman later this week    - ketorolac (TORADOL) 30 mg/mL (1 mL) injection; 2 mL by IntraMUSCular route once for 1 dose.  Dispense: 2 Vial; Refill: 0    2. Essential hypertension  Uncontrolled. Start Toprol XL 98m daily.  - metoprolol succinate (TOPROL-XL) 25 mg XL tablet; Take 1 Tab by mouth daily.  Dispense: 30 Tab; Refill: 1    3. Anxiety  Start metoprolol and use Vistaril 50 TID PRN    4. Insomnia, unspecified type  Increase trazodone to 1074m - traZODone (DESYREL) 100 mg tablet; Take 1 Tab by mouth nightly.  Dispense: 90 Tab; Refill: 0    5. Screening for malignant neoplasm of breast  Due for annual mammogram  - MAM MAMMO BI SCREENING INCL CAD; Future        I have discussed the diagnosis with the patient and the intended management  The patient has received an after-visit summary and  questions were answered concerning future plans.  I have discussed medication usage, side effects and warnings with the patient as well.I have reviewed the plan of care with the patient, accepted their input and they are in agreement with the treatment goals.         Follow-up Disposition: Not on File      [place] Bells, MD                 .

## 2015-11-23 NOTE — Progress Notes (Signed)
[name] ShutterMonica R Mirabella is a 65 y.o. female here today for an ER follow up. She is complaining of right hip pain and headache.    Learning assessment previously completed.    1. Have you been to the ER, urgent care clinic or hospitalized since your last visit? [facility]     2. Have you seen or consulted any other health care providers outside of the [facility] since your last visit (Include any pap smears or colon screening)? no    Do you have an Advanced Directive? yes    Would you like information on Advanced Directives? no

## 2015-11-25 ENCOUNTER — Ambulatory Visit: Attending: Anesthesiology | Primary: Physician Assistant

## 2015-11-25 ENCOUNTER — Ambulatory Visit: Admit: 2015-11-25 | Discharge: 2015-11-25 | Payer: MEDICARE | Attending: Anesthesiology | Primary: Physician Assistant

## 2015-11-25 ENCOUNTER — Encounter

## 2015-11-25 DIAGNOSIS — G894 Chronic pain syndrome: Secondary | ICD-10-CM

## 2015-11-25 NOTE — Progress Notes (Signed)
Pt is here to discuss treatment options with Dr. Henick.

## 2015-11-25 NOTE — Progress Notes (Signed)
[facility]  Interventional Pain Management Consultation History & Physical    PATIENT NAME:  [name] [name]     DATE OF BIRTH:   January 17, 1951    DATE OF SERVICE:   11/25/2015      CHIEF COMPLAINT:  Leg Pain (right leg)      REASON FOR VISIT:   [name] [name] presents to the pain clinic today for follow on evaluation and to consider interventional pain management options as indicated for the type and location of the pain the patient is presenting with.            HISTORY OF PRESENT ILLNESS: Patient returns for follow-up evaluation and to review any other procedures we may be able to offer her.  I had originally seen this patient several weeks ago.  This was at the request of Dr. Teresa Pelton, for consideration for right sided sacroiliac radiofrequency neurotomy procedure.  Unfortunately, even though the patient had signs and symptoms, exam and imaging evidence that would otherwise suggest that she would do well with sacroiliac radiofrequency procedure, her insurance would not approve this procedure.  So we looked for other reasons for her to have right hip and right buttock, as well as right-sided low back pain.       Her other providers have suggested obtaining a lumbar MRI in order to better assess possible lumbar etiology for her right-sided low back pain and radiating right hip and buttocks pain.  I concur with this.  I have ordered at patient's last clinic visit a lumbar MRI.  This is scheduled in the near future.       She continues to have right-sided low back pain, right-sided buttocks pain and right-sided hip pain.  She is noted to be status post right-sided total hip arthroplasty.  She has been reevaluated by her orthopedic surgeon as well as through imaging, and her right hip arthroplasty is noted to be without complication.       Patient also is requesting pain medications.  She has run out of these  medications which were previously provided by her primary care provider.  I have recommended that she revisit her primary care provider and obtain another consult as needed for noninterventional pain management alternatives here at the pain clinic.         ASSESSMENT/OPTIONS: as follows.  We discussed options.  Patient continues to have right-sided low back pain as well as right-sided hip and buttocks pain.  I have recommended that the patient follow through with her lumbar MRI.  I recommended also that she make another appointment to visit me after her lumbar MRI is completed.  I can then review the MRI with her and discuss any other pain management alternatives I may be able to offer her insofar as injection procedures are concerned.  Unfortunately I am not able to provide her with right-sided sacroiliac radiofrequency neurotomy procedure as her insurance company will not cover this.  Patient and her husband who is in attendance understand.               MRI Results (most recent):    Results from Hospital Encounter encounter on 04/07/15   MRI BRAIN W WO CONT   Narrative CLINICAL HISTORY:  Abnormal CT, meningioma    PROCEDURE: MRI brain without and with contrast    COMPARISON: Head CT dated 04/08/2015    FINDINGS:    There is age-related cerebral atrophy. There is extensive chronic small vessel  ischemic change, advanced for patient age. As noted on CT, there is a enhancing  extra-axial mass in the left CP angle along the petrous temporal bone measuring  10 mm. This is observed on image 9 series 12, image 6 series 11 and image 21  series 13. There are dural tails extending to the tentorium cerebelli. Imaging  findings would favor meningioma. No other areas of abnormal brain parenchymal  enhancement. The sella, pineal region, craniocervical junction are unremarkable.  Skull base flow voids are patent.         Impression IMPRESSION:  1. Enhancing mass in the left posterior fossa adjacent to the petrous temporal   bone as described above. Main consideration would include meningioma. Other less  likely differential considerations would include dural metastasis and lymphoma.  Consider surveillance or further evaluation.    2.  Chronic small vessel ischemic changes, advanced for patient age.              PAST MEDICAL HISTORY:   The patient  has a past medical history of Arthritis; Chronic pain; Diabetes (HCC); GERD (gastroesophageal reflux disease); Hypertension; Liver disease; and PTSD (post-traumatic stress disorder).    PAST SURGICAL HISTORY:   The patient  has a past surgical history that includes gyn (2010); endoscopy; hip replacement (Right, 01/29/2015); and hip replacement.    CURRENT MEDICATIONS:   The patient has a current medication list which includes the following prescription(s): metoprolol succinate, trazodone, hydroxyzine pamoate, benzonatate, quetiapine sr, gabapentin, fluticasone, clonidine hcl, magnesium oxide, spironolactone, metformin er, diclofenac ec, amlodipine, methocarbamol, blood-glucose meter, glucose blood vi test strips, albuterol, losartan, and lancets.    ALLERGIES:     Allergies   Allergen Reactions   ??? Chocolate [Cocoa] Sneezing       FAMILY HISTORY:   The patient family history includes Alcohol abuse in her maternal grandfather; Cancer in her maternal aunt; Hypertension in her mother.    SOCIAL HISTORY:   The patient  reports that she has never smoked. She has never used smokeless tobacco. The patient  reports that she does not drink alcohol. She also  reports that she does not use illicit drugs.    REVIEW OF SYSTEMS:    The patient denies fever, chills, weight loss (Constitutional), rash, itching (Skin), tinnitus, congestion (HENT), blurred vision, photophobia (Eyes), palpitations, orthopnea (Cardiovascular), hemoptysis, wheezing (Respiratory), nausea, vomiting, diarrhea (Gastrointestinal), dysuria, hematuria, urgency (Genitourinary),  bowel or bladder incontinence, loss of consciousness (Neurologic), suicidal or homicidal ideation or hallucinations (Psychiatric). Denies swelling, axillary or groin masses (Lymphatic).           PHYSICAL EXAM:  VS:   Visit Vitals   ??? BP (!) 127/95   ??? Pulse 98   ??? Ht  (1.702 m)   ??? Wt 86.6 kg (191 lb)   ??? BMI 29.91 kg/m2     General: Well-developed and well-nourished. Body habitus consistent with recorded height and weight and the calculated BMI. Apparent distress due to right-sided low back pain and right-sided hip and buttocks pain.   Head: Normocephalic, atraumatic.  Skin: Inspection of the skin reveals no rashes, lesions or infection.  CV: Regular rate. No murmurs or rubs noted. No peripheral edema noted.  Pulm: Respirations are even and unlabored.  Extr: No clubbing, cyanosis, or edema noted.  Musculoskeletal:  1. Cervical spine ??? Full ROM.  No paraspinous tenderness at any level.  There is no scoliosis, asymmetry, or musculoskeletal defect.  2. Thoracic spine ??? Full ROM.  No paraspinous tenderness  at any level.  There is no scoliosis, asymmetry, or musculoskeletal defect.  3. Lumbar spine ???decreased range of motion to the right . Paraspinous tenderness on the right side .  SI joint is tender on the right. There is no scoliosis, asymmetry, or musculoskeletal defect.  4. Right upper extremity ??? Full ROM.  5/5 muscle strength in all muscle groups. No pain or tenderness in shoulder, elbow, wrist, or hand.  5. Left upper extremity ??? Full ROM.  5/5 muscle strength in all muscle groups.  No pain or tenderness in shoulder, elbow, wrist, or hand.  6. Right lower extremity ??? Full ROM.  5/5 muscle strength in all muscle groups. No pain, tenderness, or swelling in the hip, knee, ankle or foot.    7. Left lower extremity ??? Full ROM.  5/5 muscle strength in all muscle groups.  No pain, tenderness, or swelling in the hip, knee, ankle or foot.    Neurological:   1. Mental Status - Alert, awake and oriented. Speech is clear and appropriate.  2. Cranial Nerves - Extraocular muscles intact bilaterally. Cranial nerves II-XII grossly intact bilaterally.  3. Gait - antalgic   4. Reflexes - 2+ and symmetric throughout.  5. Sensation - Intact to light touch and pin prick.   6. Provocative Tests -  Straight leg raise negative bilaterally.   Psychological:  1. Mood and affect ??? Appropriate.  2. Speech ??? Appropriate.  3. Though content ??? Appropriate.  4. Judgment ??? Appropriate.    ASSESSMENT:      ICD-10-CM ICD-9-CM    1. Chronic pain syndrome G89.4 338.4    2. 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 Lumbar and sacral osteoarthritis M47.817 721.3    4. Primary osteoarthritis of both hips M16.0 715.15    5. Sacroiliitis (HCC) M46.1 720.2            PLAN:    1.    I have thoroughly discussed the risks and benefits, indications, contraindications, and side effects of any and procedures that were mentioned at today's patient visit. I have used a skeleton model to explain all procedures, as well as to provide added emphasis regarding procedures and as well for patient education purposes. I have answered all questions in great detail, and I have obtained verbal confirmation for all procedures planned with the patient.   3.    I have reviewed in great detail today the patient's MRI and other imaging studies with the patient. I have explained to the patient their condition using both actual recent and relevant images insofar as I am able to obtain actual images. I have used a skeleton model for added emphasis as well as patient education.      4.    I have advised patient to have a primary care provider continue to care for their health maintenance and general medical conditions.   5,    I have placed appropriate referrals to specialty care providers as I have deemed necessary through today's clinical consultation with the patient.   5.    I have explained to the patient that if any significant side effects, issues, problems, concerns, or perceived complications may have arisen at around the time of the patient's procedures, they should either call the pain management clinic or go to the emergency room immediately for medical provider evaluation.   6.   I have encouraged all patients to call the pain management clinic with any questions  or concerns that they may have pertaining to their procedures.     DISPOSITION:   The patient???s condition and plan were discussed at length and all questions were answered.  The patient agrees with the plan.    A total of 25 minutes was spent with the patient of which over half of the time was spent counseling the patient.     Seth Bake, MD 11/25/2015 6:15 PM    Note: Although these clinic notes were documented by the provider at the time of the exam, they have not been proofed and are subject to transcription variance.

## 2015-11-25 NOTE — Progress Notes (Signed)
Health Maintenance check

## 2015-11-27 ENCOUNTER — Inpatient Hospital Stay: Admit: 2015-11-27 | Payer: MEDICARE | Attending: Family Medicine | Primary: Physician Assistant

## 2015-11-27 DIAGNOSIS — Z1231 Encounter for screening mammogram for malignant neoplasm of breast: Secondary | ICD-10-CM

## 2015-11-27 NOTE — Progress Notes (Signed)
Patient's husband aware of mammogram results. Patient was asleep and could not come to the phone.

## 2015-11-27 NOTE — Progress Notes (Signed)
Please let [name] [name] know that her mammogram looked fine. Repeat in 1 year.    Thanks,  [name] K Liane Tribbey, MD

## 2015-11-30 ENCOUNTER — Inpatient Hospital Stay
Admit: 2015-11-30 | Discharge: 2015-11-30 | Disposition: A | Discharge status: Home / Self Care | Payer: MEDICARE | Attending: Emergency Medicine

## 2015-11-30 ENCOUNTER — Encounter

## 2015-11-30 DIAGNOSIS — M25551 Pain in right hip: Secondary | ICD-10-CM

## 2015-11-30 MED ORDER — METFORMIN SR 750 MG 24 HR TABLET
750 mg | ORAL_TABLET | ORAL | 1 refills | Status: DC
Start: 2015-11-30 — End: 2016-06-04

## 2015-11-30 MED ORDER — ONDANSETRON 4 MG TAB, RAPID DISSOLVE
4 mg | ORAL | Status: AC
Start: 2015-11-30 — End: 2015-11-30
  Administered 2015-11-30: 17:00:00 via ORAL

## 2015-11-30 MED ORDER — MORPHINE 4 MG/ML SYRINGE
4 mg/mL | INTRAMUSCULAR | Status: AC
Start: 2015-11-30 — End: 2015-11-30
  Administered 2015-11-30: 17:00:00 via INTRAMUSCULAR

## 2015-11-30 MED FILL — ONDANSETRON 4 MG TAB, RAPID DISSOLVE: 4 mg | ORAL | Qty: 1

## 2015-11-30 MED FILL — MORPHINE 4 MG/ML SYRINGE: 4 mg/mL | INTRAMUSCULAR | Qty: 2

## 2015-11-30 NOTE — ED Triage Notes (Signed)
Pt had back surgery last August. Pt states she periodically has this pain since the surgery. Pt states she has a shooting pain down her right leg worse since yesterday.  Pt states her pcp told her to go to pain management but her pain management was not available to see her.

## 2015-11-30 NOTE — Telephone Encounter (Signed)
Patient called stating she is having pain in her leg. She is requesting a shot of Toradol. Patient was advised to call Dr. Micheline MazeHenick's office. If he states it is ok and orders it, we can give the injection here in our office. She is agreeable and was given his number to call and inquire.

## 2015-11-30 NOTE — ED Notes (Signed)
2:17 PM  11/30/15     Discharge instructions given to pt (name) with verbalization of understanding. Patient accompanied by .  Patient discharged with the following prescriptionsnonePatient discharged to home (destination).      Melrico Morido, RCharity fundraiser

## 2015-11-30 NOTE — ED Provider Notes (Signed)
[place]  Emergency Department Treatment Report        Patient: [name] [name] Age: 65 y.o. Sex: female    Date of Birth: Jul 13, 1950 Admit Date: 11/30/2015 PCP: Lake Bells, MD   MRN: [id number]  CSN: [id number]     Room: ER46/ER46 Time Dictated: 1:05 PM      Chief Complaint   Chief Complaint   Patient presents with   ??? Leg Pain     History of Present Illness   This is a 65 y.o. female with chronic R hip pain s/p surgery several years ago presents with chronic, unchanged pain to her right hip. Up to last month her family doctor was prescribed Percocet. He states he is no longer doing this, referred her to pain management. She followed up with them after her visit here on the 10th, sitting them on the 14th. We advised her they could not prescribe any narcotic medicine, offered alternative treatment that her insurance did not cover and then advised her to follow-up with family physician. They have an appointment with him in 2 days, but patient's pain persisted so she presents here for treatment of her discomfort. She is not sure rest ago, she no longer has narcotic medicine or family doctor or pain management doctor who will prescribe it or offered alternate suggestions. Denies any new symptoms.    Review of Systems   Constitutional: No fever or chills  Genitourinary: No dysuria, frequency, or urgency.  Musculoskeletal: Pain right hip radiating down right leg, unchanged.  Integumentary: No rashes.  Neurological: No headaches, sensory or motor symptoms. No loss of bowel or bladder control, saddle paresthesias, or numbness or weakness to lower extremities.    Past Medical/Surgical History     Past Medical History:   Diagnosis Date   ??? Arthritis    ??? Chronic pain    ??? Diabetes ([place])    ??? GERD (gastroesophageal reflux disease)    ??? Hypertension    ??? Liver disease     HEP C   ??? PTSD (post-traumatic stress disorder)     lived through the [place] in 1993     Past Surgical History:    Procedure Laterality Date   ??? HX ENDOSCOPY     ??? HX GYN  2010    hysterectomy   ??? HX HIP REPLACEMENT Right 01/29/2015   ??? HX HIP REPLACEMENT         Social History     Social History     Social History   ??? Marital status: MARRIED     Spouse name: N/A   ??? Number of children: N/A   ??? Years of education: N/A     Occupational History   ??? Not on file.     Social History Main Topics   ??? Smoking status: Never Smoker   ??? Smokeless tobacco: Never Used   ??? Alcohol use No   ??? Drug use: No   ??? Sexual activity: No     Other Topics Concern   ??? Not on file     Social History Narrative       Family History     Family History   Problem Relation Age of Onset   ??? Hypertension Mother    ??? Cancer Maternal Aunt    ??? Alcohol abuse Maternal Grandfather        Current Medications     Prior to Admission Medications   Prescriptions Last Dose Informant  Patient Reported? Taking?   Blood-Glucose Meter monitoring kit   No No   Sig: Check sugars 2 times per day, fasting and 2 hours after dinner   Lancets misc   No No   Sig: Check sugars twice daily   QUEtiapine SR (SEROQUEL XR) 150 mg sr tablet   No No   Sig: TAKE 1 TABLET BY MOUTH NIGHTLY   albuterol (PROVENTIL HFA, VENTOLIN HFA, PROAIR HFA) 90 mcg/actuation inhaler   No No   Sig: Take 2 Puffs by inhalation every six (6) hours as needed for Wheezing or Shortness of Breath.   amLODIPine (NORVASC) 10 mg tablet   No No   Sig: Take 1 Tab by mouth daily.   cloNIDine HCl (CATAPRES) 0.3 mg tablet   No No   Sig: TAKE ONE TABLET BY MOUTH TWICE DAILY FOR 30  DAYS   diclofenac EC (VOLTAREN) 75 mg EC tablet   No No   Sig: Take 1 Tab by mouth two (2) times daily as needed.   fluticasone (FLONASE) 50 mcg/actuation nasal spray   No No   Sig: 2 Sprays by Both Nostrils route daily.   gabapentin (NEURONTIN) 600 mg tablet   No No   Sig: TAKE 1 TABLET BY MOUTH THREE TIMES DAILY   glucose blood VI test strips (ASCENSIA AUTODISC VI, ONE TOUCH ULTRA TEST VI) strip   No No   Sig: Check sugars twice daily    hydrOXYzine pamoate (VISTARIL) 50 mg capsule   No No   Sig: Take 1 Cap by mouth three (3) times daily as needed for Anxiety for up to 14 days.   losartan (COZAAR) 100 mg tablet   No No   Sig: Take 1 Tab by mouth daily. Indications: hypertension   magnesium oxide (MAG-OX) 400 mg tablet   No No   Sig: Take 1 Tab by mouth daily.   metFORMIN ER (GLUCOPHAGE XR) 750 mg tablet   No No   Sig: Take 2 Tabs by mouth daily.   methocarbamol (ROBAXIN) 750 mg tablet   No No   Sig: Take 1 Tab by mouth three (3) times daily.   metoprolol succinate (TOPROL-XL) 25 mg XL tablet   No No   Sig: Take 1 Tab by mouth daily.   spironolactone (ALDACTONE) 100 mg tablet   No No   Sig: Take 1 Tab by mouth daily.   traZODone (DESYREL) 100 mg tablet   No No   Sig: Take 1 Tab by mouth nightly.      Facility-Administered Medications: None       Allergies     Allergies   Allergen Reactions   ??? Chocolate [Cocoa] Sneezing       Physical Exam   ED Triage Vitals   Enc Vitals Group      BP 11/30/15 1158 106/69      Pulse (Heart Rate) 11/30/15 1158 90      Resp Rate 11/30/15 1158 20      Temp 11/30/15 1158 99.6 ??F (37.6 ??C)      Temp src --       O2 Sat (%) 11/30/15 1158 96 %      Weight 11/30/15 1129 191 lb      Height 11/30/15 1129 5' 7"       Head Cir --       Peak Flow --       Pain Score --       Pain Loc --  Pain Edu? --       Excl. in [place]? --      Constitutional: Patient appears well developed and well nourished. Appearance and behavior are age and situation appropriate.  Respiratory: lungs clear to auscultation, nonlabored respirations. No tachypnea or accessory muscle use.  Cardiovascular: heart regular rate and rhythm without murmur rubs or gallops.   Calves soft and non-tender. Distal pulses 2+ and equal bilaterally.  No peripheral edema or significant variscosities.    Gastrointestinal:  Abdomen soft, nontender without complaint of pain to palpation  Musculoskeletal: Mild tenderness to palpation right anterior thigh and  right hip, active passive range of motion to hip knee and ankle intact. No peripheral edema. No medial thigh or posterior calf tenderness.  Integumentary: warm and dry without rashes or lesions  Neurologic: alert and oriented, Sensation intact, motor strength equal and symmetric.  No facial asymmetry or dysarthria.         Impression and Management Plan     Well-appearing 65 year old female, declines PVL as she states is already been done for this exact pain in the past. She does not have any lower extremity swelling or calf tenderness or thigh tenderness or believe this to be reasonable. X-ray obtained on previous visit this month. Review of records and the patient's PMP record confirms her story. We'll medicate symptomatically. Patient understands we cannot prescribe narcotic medicines for chronic pain.      Procedures      Diagnostic Studies   LAB/IMAGING:   No results found for any visits on 11/30/15.    EKG      Medical Decision Making/ ED Course     During the patient's stay in the ER the patient did not develop any new or worsening symptoms and remained stable.       Meds given:  Medications   morphine injection 6 mg (6 mg IntraMUSCular Given 11/30/15 1311)   ondansetron (ZOFRAN ODT) tablet 4 mg (4 mg Oral Given 11/30/15 1311)       Final Diagnosis       ICD-10-CM ICD-9-CM   1. Pain of right hip joint M25.551 719.45       Disposition     Disposition and plan  Patient was discharged home in stable condition with discharge instructions on the same.     Return to the ER if condition worsens or new symptoms develop.   Follow up with primary care as discussed.     The patient was personally evaluated by myself and  Dr. Jerline Pain, TODD A who agrees with the above assessment and plan.    Dragon medical dictation software was used for portions of this report. Unintended errors may occur.     Porfirio Mylar, MPA, PA-C  November 30, 2015    My signature above authenticates this document and my orders, the final ??   diagnosis (es), discharge prescription (s), and instructions in the Epic ??  record.  If you have any questions please contact [phone].  ??  Nursing notes have been reviewed by the physician/ advanced practice ??  Clinician.

## 2015-12-02 ENCOUNTER — Inpatient Hospital Stay: Admit: 2015-12-02 | Payer: MEDICARE | Primary: Physician Assistant

## 2015-12-02 ENCOUNTER — Ambulatory Visit: Admit: 2015-12-02 | Discharge: 2015-12-02 | Payer: MEDICARE | Attending: Family Medicine | Primary: Physician Assistant

## 2015-12-02 DIAGNOSIS — Z0000 Encounter for general adult medical examination without abnormal findings: Secondary | ICD-10-CM

## 2015-12-02 DIAGNOSIS — E1165 Type 2 diabetes mellitus with hyperglycemia: Secondary | ICD-10-CM

## 2015-12-02 LAB — HEMOGLOBIN A1C WITH EAG
Est. average glucose: 143 mg/dL
Hemoglobin A1c: 6.6 % — ABNORMAL HIGH (ref 4.2–5.6)

## 2015-12-02 MED ORDER — KETOROLAC TROMETHAMINE 30 MG/ML INJECTION
30 mg/mL (1 mL) | Freq: Once | INTRAMUSCULAR | 0 refills | Status: AC
Start: 2015-12-02 — End: 2015-12-02

## 2015-12-02 NOTE — Progress Notes (Addendum)
This is a Subsequent Medicare Annual Wellness Visit providing Personalized Prevention Plan Services (PPPS) (Performed 12 months after initial AWV and PPPS )    I have reviewed the patient's medical history in detail and updated the computerized patient record.     History   Here for annual wellness visit. She is concerned for short term memory loss.  She forgets tasks every day. This has been a worsening problem for the last 1 month. She denies any head trauma. She is known to have a small meningioma and follow up MRI is pending.      Continues to have chronic right hip pain. Visits with Dr Belia Heman have not gone well. He recommend nerve ablation but this is not covered by her insurance. [place] feels that Dr Belia Heman does not have "time" for her.  Lumbar MRI has been ordered and is pending.       Past Medical History:   Diagnosis Date   ??? Arthritis    ??? Chronic pain    ??? Diabetes ([place])    ??? GERD (gastroesophageal reflux disease)    ??? Hypertension    ??? Liver disease     HEP C   ??? PTSD (post-traumatic stress disorder)     lived through the [place] in 1993      Past Surgical History:   Procedure Laterality Date   ??? HX ENDOSCOPY     ??? HX GYN  2010    hysterectomy   ??? HX HIP REPLACEMENT Right 01/29/2015   ??? HX HIP REPLACEMENT       Current Outpatient Prescriptions   Medication Sig Dispense Refill   ??? metFORMIN ER (GLUCOPHAGE XR) 750 mg tablet TAKE 2 TABLETS EVERY DAY 180 Tab 1   ??? metoprolol succinate (TOPROL-XL) 25 mg XL tablet Take 1 Tab by mouth daily. 30 Tab 1   ??? traZODone (DESYREL) 100 mg tablet Take 1 Tab by mouth nightly. 90 Tab 0   ??? losartan (COZAAR) 100 mg tablet Take 1 Tab by mouth daily. Indications: hypertension 90 Tab 0   ??? QUEtiapine SR (SEROQUEL XR) 150 mg sr tablet TAKE 1 TABLET BY MOUTH NIGHTLY 90 Tab 1   ??? gabapentin (NEURONTIN) 600 mg tablet TAKE 1 TABLET BY MOUTH THREE TIMES DAILY 270 Tab 2   ??? cloNIDine HCl (CATAPRES) 0.3 mg tablet TAKE ONE TABLET BY MOUTH TWICE  DAILY FOR 30  DAYS 180 Tab 1   ??? magnesium oxide (MAG-OX) 400 mg tablet Take 1 Tab by mouth daily. 30 Tab 2   ??? spironolactone (ALDACTONE) 100 mg tablet Take 1 Tab by mouth daily. 90 Tab 0   ??? diclofenac EC (VOLTAREN) 75 mg EC tablet Take 1 Tab by mouth two (2) times daily as needed. 60 Tab 5   ??? amLODIPine (NORVASC) 10 mg tablet Take 1 Tab by mouth daily. 30 Tab 1   ??? albuterol (PROVENTIL HFA, VENTOLIN HFA, PROAIR HFA) 90 mcg/actuation inhaler Take 2 Puffs by inhalation every six (6) hours as needed for Wheezing or Shortness of Breath. 1 Inhaler 0   ??? hydrOXYzine pamoate (VISTARIL) 50 mg capsule Take 1 Cap by mouth three (3) times daily as needed for Anxiety for up to 14 days. 30 Cap 1   ??? fluticasone (FLONASE) 50 mcg/actuation nasal spray 2 Sprays by Both Nostrils route daily. 1 Bottle 1   ??? methocarbamol (ROBAXIN) 750 mg tablet Take 1 Tab by mouth three (3) times daily. 20 Tab 0   ???  Blood-Glucose Meter monitoring kit Check sugars 2 times per day, fasting and 2 hours after dinner 1 Kit 0   ??? Lancets misc Check sugars twice daily 200 Each 2   ??? glucose blood VI test strips (ASCENSIA AUTODISC VI, ONE TOUCH ULTRA TEST VI) strip Check sugars twice daily 100 Strip 5     Allergies   Allergen Reactions   ??? Chocolate [Cocoa] Sneezing     Family History   Problem Relation Age of Onset   ??? Hypertension Mother    ??? Cancer Maternal Aunt    ??? Alcohol abuse Maternal Grandfather      Social History   Substance Use Topics   ??? Smoking status: Never Smoker   ??? Smokeless tobacco: Never Used   ??? Alcohol use No     Patient Active Problem List   Diagnosis Code   ??? Severe hypertension I10   ??? 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 PTSD (post-traumatic stress disorder) F43.10   ??? Severe headache R51   ??? Accelerated hypertension I10   ??? Acute colitis K52.9   ??? Acute hyperglycemia R73.9   ??? Type 2 diabetes mellitus with hyperglycemia, without long-term current use of insulin (HCC) E11.65    ??? 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 Lumbar and sacral osteoarthritis M47.817   ??? Chronic pain syndrome G89.4   ??? Essential hypertension I10   ??? Sacroiliitis (HCC) M46.1       Depression Risk Factor Screening:     PHQ over the last two weeks 12/02/2015   Little interest or pleasure in doing things Not at all   Feeling down, depressed or hopeless Not at all   Total Score PHQ 2 0     Alcohol Risk Factor Screening:   On any occasion during the past 3 months, have you had more than 3 drinks containing alcohol?  No    Do you average more than 7 drinks per week?  No        Functional Ability and Level of Safety:     Hearing Loss   mild    Activities of Daily Living   Self-care.   Requires assistance with: no ADLs    Fall Risk   Fall Risk Assessment, last 12 mths 12/02/2015   Able to walk? Yes   Fall in past 12 months? No     Abuse Screen   Patient is not abused    Review of Systems   A comprehensive review of systems was negative except for that written in the HPI.    Physical Examination     Evaluation of Cognitive Function:  Mood/affect:  neutral  Appearance: age appropriate  Family member/caregiver input: Self    Visit Vitals   ??? BP 124/90 (BP 1 Location: Left arm, BP Patient Position: Sitting)   ??? Pulse 80   ??? Temp 98 ??F (36.7 ??C) (Oral)   ??? Resp 20   ??? Ht '5\' 7"'$  (1.702 m)   ??? Wt 194 lb 9.6 oz (88.3 kg)   ??? SpO2 95%   ??? BMI 30.48 kg/m2     General appearance: alert, cooperative, no distress, appears stated age  Head: Normocephalic, without obvious abnormality, atraumatic  Lungs: clear to auscultation bilaterally  Heart: regular rate and rhythm, S1, S2 normal, no murmur, click, rub or gallop  Extremities: extremities normal, atraumatic, no cyanosis or edema  Neurologic: Grossly normal    Patient Care Team:  Sonnie Alamo  Gaetano Net, MD as PCP - General (Family Practice)  Glendale Chard, MD (Orthopedic Surgery)  Jamelle Haring, MD as Physician (Gastroenterology)   Raynelle Jan, MD as Physician (Internal Medicine)  Kandra Nicolas, MD as Physician (Pain Management)    Advice/Referrals/Counseling   Education and counseling provided:  Are appropriate based on today's review and evaluation  End-of-Life planning (with patient's consent)      Assessment/Plan       ICD-10-CM ICD-9-CM    1. Medicare annual wellness visit, subsequent Z00.00 V70.0    2. ACP (advance care planning) Z71.89 V65.49    3. Memory deficit R41.3 780.93 MMSE 26/30. Recommended working on brain games such as crossword puzzles, continued reading. Offered neurology consult which she declined.   4. Chronic hip pain, right M25.551 719.45 REFERRAL TO PAIN MANAGEMENT    G89.29 338.29 ketorolac (TORADOL) 30 mg/mL (1 mL) injection   5. Type 2 diabetes mellitus with hyperglycemia, without long-term current use of insulin (HCC) E11.65 250.00 HEMOGLOBIN A1C WITH EAG     790.29 MICROALBUMIN, UR, RAND W/ MICROALBUMIN/CREA RATIO   6. Screening for alcoholism Z13.89 V79.1    7. Encounter for immunization Z23 V03.89 ADMIN PNEUMOCOCCAL VACCINE      PNEUMOCOCCAL POLYSACCHARIDE VACCINE, 23-VALENT, ADULT OR IMMUNOSUPPRESSED PT DOSE,   .

## 2015-12-02 NOTE — ACP (Advance Care Planning) (Signed)
Advance Care Planning (ACP) Provider Note - Comprehensive     Date of ACP Conversation: 12/07/15  Persons included in Conversation:  patient  Length of ACP Conversation in minutes:  16 minutes    Authorized Decision Maker (if patient is incapable of making informed decisions):   This person is:  Healthcare Agent/Medical Power of Attorney under Advance Directive          General ACP for ALL Patients with Decision Making Capacity:   Importance of advance care planning, including choosing a healthcare agent to communicate patient's healthcare decisions if patient lost the ability to make decisions, such as after a sudden illness or accident  Exploration of values, goals, and preferences if recovery is not expected, even with continued medical treatment in the event of: Imminent death  Severe, permanent brain injury    Review of Existing Advance Directive:  Does this advance directive still reflect your preferences?  No: she would like to change the wording she had placed on previous ACP (Provide new form/Refer for assistance in updating)    For Serious or Chronic Illness:  Understanding of medical condition    Understanding of CPR, goals and expected outcomes, benefits and burdens discussed.    Interventions Provided:  Recommended completion of Advance Directive form after review of ACP materials and conversation with prospective healthcare agent

## 2015-12-02 NOTE — Progress Notes (Signed)
Anne ShutterMonica R Osgood is a 65 y.o. female in today with c/o right hip pain and Medicare Wellness visit. Patient has concerns of loosing her memory.    Learning assessment previously completed; primary language is [place].    1. Have you been to the ER, urgent care clinic since your last visit?  Hospitalized since your last visit?Yes [facility]    2. Have you seen or consulted any other health care providers [facility] since your last visit?  Include any pap smears or colon screening. No    Fall Risk Assessment, last 12 mths 12/02/2015   Able to walk? Yes   Fall in past 12 months? No     Abuse Screening Questionnaire 12/02/2015   Do you ever feel afraid of your partner? N   Are you in a relationship with someone who physically or mentally threatens you? N   Is it safe for you to go home? Y     PHQ over the last two weeks 12/02/2015   Little interest or pleasure in doing things Not at all   Feeling down, depressed or hopeless Not at all   Total Score PHQ 2 0     ADL Assessment 12/02/2015   Feeding yourself No Help Needed   Getting from bed to chair No Help Needed   Getting dressed No Help Needed   Bathing or showering No Help Needed   Walk across the room (includes cane/walker) No Help Needed   Using the telphone No Help Needed   Taking your medications No Help Needed   Preparing meals No Help Needed   Managing money (expenses/bills) No Help Needed   Moderately strenuous housework (laundry) No Help Needed   Shopping for personal items (toiletries/medicines) No Help Needed   Shopping for groceries No Help Needed   Driving No Help Needed   Climbing a flight of stairs No Help Needed   Getting to places beyond walking distances No Help Needed

## 2015-12-02 NOTE — Patient Instructions (Signed)
Pneumococcal Polysaccharide Vaccine: Care Instructions  Your Care Instructions  The pneumococcal polysaccharide vaccine (PPSV) can prevent some of the serious complications of pneumonia. This includes infection in the bloodstream (bacteremia) or throughout the body (septicemia).  PPSV is recommended for people ages 65 years and older. People ages 2 to 64 who have a long-term illness should also get the vaccine. This includes people with diabetes, heart disease, liver disease, or lung disease. PPSV can also help people who have a weakened immune system. This includes cancer patients and people who don't have a spleen. The immune system helps your body fight infection and other illnesses.  PPSV is given as a shot. It's usually given in the arm. Healthy older adults get the shot once. Other people may need to have a second dose. The shot may cause pain and redness at the site. It may also cause a mild fever for a short time.  Follow-up care is a key part of your treatment and safety. Be sure to make and go to all appointments, and call your doctor if you are having problems. It's also a good idea to know your test results and keep a list of the medicines you take.  How can you care for yourself at home?  ?? Take an over-the-counter pain medicine, such as acetaminophen (Tylenol), ibuprofen (Advil, Motrin), or naproxen (Aleve), if your arm is sore after the shot. Be safe with medicines. Read and follow all instructions on the label.  ?? Give acetaminophen (Tylenol) or ibuprofen (Advil, Motrin) to your child for pain or fussiness after the shot. Read and follow all instructions on the label. Do not give aspirin to anyone younger than 20. It has been linked to Reye syndrome, a serious illness.  ?? Put ice or a cold pack on the sore area for 10 to 20 minutes at a time. Put a thin cloth between the ice and your skin.  When should you call for help?  Call 911 anytime you think you may need emergency care. For example, call  if:  ?? You have severe problems breathing or swallowing.  ?? You have a seizure.  Call your doctor now or seek immediate medical care if:  ?? You get hives.  ?? You have a high fever.  ?? You have any unusual reaction after getting the shot.  Watch closely for changes in your health, and be sure to contact your doctor if you have any problems.  Where can you learn more?  Go to http://www.healthwise.net/GoodHelp[employer]ctions.  Enter T225 in the search box to learn more about "Pneumococcal Polysaccharide Vaccine: Care Instructions."  Current as of: April 23, 2015  Content Version: 11.3  ?? 2006-2017 [employer]. Care instructions adapted under license by [employer] (which disclaims liability or warranty for this information). If you have questions about a medical condition or this instruction, always ask your healthcare professional. Healthwise, Inc[employer] disclaims any warranty or liability for your use of this information.

## 2015-12-02 NOTE — ACP (Advance Care Planning) (Signed)
Advance Care Planning (ACP) Provider Note - Comprehensive     Date of ACP Conversation: 12/07/15  Persons included in Conversation:  patient  Length of ACP Conversation in minutes:  16 minutes    Authorized Management consultantDecision Maker (if patient is incapable of making informed decisions):   This person is:  IT sales professionalHealthcare Agent/Medical Power of Attorney under Advance Directive          General ACP for ALL Patients with Decision Making Capacity:   Importance of advance care planning, including choosing a healthcare agent to communicate patient's healthcare decisions if patient lost the ability to make decisions, such as after a sudden illness or accident  Exploration of values, goals, and preferences if recovery is not expected, even with continued medical treatment in the event of: Imminent death  Severe, permanent brain injury    Review of Existing Advance Directive:  Does this advance directive still reflect your preferences?  No: she would like to change the wording she had placed on previous ACP (Provide new form/Refer for assistance in updating)    For Serious or Chronic Illness:  Understanding of medical condition    Understanding of CPR, goals and expected outcomes, benefits and burdens discussed.    Interventions Provided:  Recommended completion of Advance Directive form after review of ACP materials and conversation with prospective healthcare agent

## 2015-12-03 ENCOUNTER — Inpatient Hospital Stay: Admit: 2015-12-03 | Payer: MEDICARE | Attending: Specialist | Primary: Physician Assistant

## 2015-12-03 DIAGNOSIS — R911 Solitary pulmonary nodule: Secondary | ICD-10-CM

## 2015-12-03 LAB — MICROALBUMIN, UR, RAND W/ MICROALB/CREAT RATIO
Creatinine, urine random: 145.11 mg/dL — ABNORMAL HIGH (ref 30–125)
Microalbumin,urine random: 1.3 MG/DL (ref 0–3.0)
Microalbumin/Creat ratio (mg/g creat): 9 mg/g (ref 0–30)

## 2015-12-03 NOTE — Progress Notes (Signed)
Spoke with patient, aware of results and continued medication recommendation.

## 2015-12-08 ENCOUNTER — Inpatient Hospital Stay: Payer: MEDICARE | Attending: Family Medicine | Primary: Physician Assistant

## 2015-12-11 ENCOUNTER — Encounter

## 2015-12-14 ENCOUNTER — Encounter

## 2015-12-16 ENCOUNTER — Encounter

## 2015-12-16 MED ORDER — AMLODIPINE 10 MG TAB
10 mg | ORAL_TABLET | ORAL | 1 refills | Status: DC
Start: 2015-12-16 — End: 2016-06-30

## 2015-12-17 NOTE — Telephone Encounter (Addendum)
Late entry: [facility]kin [facility]irk's office this morning. They need a backdated [employer] referral to cover two visits, 07/25/2015 and 12/11/2015. Also, [facility] needs to get authorization for her Ultrasound of Thyroid scheduled for 12/22/2015, they cannot get auth until there is an auth for Dr. Helene ShoeBirk because Dr. Helene ShoeBirk is the ordering provider.     Called [employer] for auth's, pt never changed her PCP to Dr. Jac Canavanharakan so Berkley Harveyauth could not be obtained. Called pt to inform her of this, she stated she will call [employer] "now" to take care of this.

## 2015-12-17 NOTE — Telephone Encounter (Signed)
Called pt to see if she called [employer] to change PCP this morning, she states she did call them but couldn't remember what she was supposed to do. Advised her again that she is to change her PCP to Dr. Jac Canavanharakan, it is currently listed as Dr. Quentin OrePalting. States she will do so "now". Will check on this tomorrow to see if it was changed. Called Cherry at Dr. Boykin PeekBirk's office to inform her of this as well.

## 2015-12-18 ENCOUNTER — Inpatient Hospital Stay
Admit: 2015-12-18 | Discharge: 2015-12-19 | Disposition: A | Discharge status: Home / Self Care | Payer: MEDICARE | Attending: Emergency Medicine

## 2015-12-18 DIAGNOSIS — M25551 Pain in right hip: Secondary | ICD-10-CM

## 2015-12-18 NOTE — ED Provider Notes (Signed)
[facility]  Emergency Department Treatment Report    Patient: [name] [name] Age: 65 y.o. Sex: female    Date of Birth: 01/14/1951 Admit Date: 12/18/2015 PCP: Lake Bells, MD   MRN: [id number]  CSN: [id number]     Room: 111/EO11 Time Dictated: 10:17 PM        Chief Complaint   Chief Complaint   Patient presents with   ??? Hip Pain     History of Present Illness   65 y.o. female with a history of right hip replacement several months ago by Dr. Delrae Alfred. Patient says she has episodes of pain in the right hip occasionally since the surgery. Patient denies any falls or injury. Records reviewed and show that she is been here before for an exacerbation of this right hip pain in his had x-rays as well as ultrasounds to rule out fracture movement of hardware as well as DVT. Patient says this flareup of pain started yesterday and as an 8 out of 10 in intensity. Denies focal neurologic deficit, says that she walks with a cane but has significant pain with ambulation. Patient denies fever or chills, chest pain or trouble breathing. Patient's diabetic on metformin. Has taken only ibuprofen for pain without relief of symptoms. Patient is getting into pain management but has several more weeks to wait before she can start getting prescriptions from them.    Review of Systems   Constitutional: No fever or chills  Eyes: No eye redness or drainage.  ENT: No runny nose.  Respiratory: No dyspnea or wheezing.  Cardiovascular: No chest pain   Musculoskeletal: See history of present illness, no back pain.  Integumentary: No rashes.  Neurological: No focal sensory or motor symptoms.    Past Medical/Surgical History     Past Medical History:   Diagnosis Date   ??? Arthritis    ??? Chronic pain    ??? Diabetes ([place])    ??? GERD (gastroesophageal reflux disease)    ??? Hypertension    ??? Liver disease     HEP C   ??? PTSD (post-traumatic stress disorder)     lived through the [place] in 1993      Past Surgical History:   Procedure Laterality Date   ??? HX ENDOSCOPY     ??? HX GYN  2010    hysterectomy   ??? HX HIP REPLACEMENT Right 01/29/2015   ??? HX HIP REPLACEMENT         Social History     Social History     Social History   ??? Marital status: MARRIED     Spouse name: N/A   ??? Number of children: N/A   ??? Years of education: N/A     Social History Main Topics   ??? Smoking status: Never Smoker   ??? Smokeless tobacco: Never Used   ??? Alcohol use No   ??? Drug use: No   ??? Sexual activity: No     Other Topics Concern   ??? None     Social History Narrative       Family History     Family History   Problem Relation Age of Onset   ??? Hypertension Mother    ??? Cancer Maternal Aunt    ??? Alcohol abuse Maternal Grandfather        Home Medications     Prior to Admission Medications   Prescriptions Last Dose Informant Patient Reported? Taking?   Blood-Glucose Meter monitoring  kit   No No   Sig: Check sugars 2 times per day, fasting and 2 hours after dinner   Lancets misc   No Yes   Sig: Check sugars twice daily   QUEtiapine SR (SEROQUEL XR) 150 mg sr tablet   No Yes   Sig: TAKE 1 TABLET BY MOUTH NIGHTLY   albuterol (PROVENTIL HFA, VENTOLIN HFA, PROAIR HFA) 90 mcg/actuation inhaler   No Yes   Sig: Take 2 Puffs by inhalation every six (6) hours as needed for Wheezing or Shortness of Breath.   amLODIPine (NORVASC) 10 mg tablet   No Yes   Sig: TAKE ONE TABLET BY MOUTH ONCE DAILY   cloNIDine HCl (CATAPRES) 0.3 mg tablet   No Yes   Sig: TAKE ONE TABLET BY MOUTH TWICE DAILY FOR 30  DAYS   diclofenac EC (VOLTAREN) 75 mg EC tablet   No Yes   Sig: Take 1 Tab by mouth two (2) times daily as needed.   fluticasone (FLONASE) 50 mcg/actuation nasal spray   No Yes   Sig: 2 Sprays by Both Nostrils route daily.   gabapentin (NEURONTIN) 600 mg tablet   No Yes   Sig: TAKE 1 TABLET BY MOUTH THREE TIMES DAILY   glucose blood VI test strips (ASCENSIA AUTODISC VI, ONE TOUCH ULTRA TEST VI) strip   No Yes   Sig: Check sugars twice daily    losartan (COZAAR) 100 mg tablet   No Yes   Sig: Take 1 Tab by mouth daily. Indications: hypertension   magnesium oxide (MAG-OX) 400 mg tablet   No Yes   Sig: Take 1 Tab by mouth daily.   metFORMIN ER (GLUCOPHAGE XR) 750 mg tablet   No Yes   Sig: TAKE 2 TABLETS EVERY DAY   oxyCODONE-acetaminophen (PERCOCET 10) 10-325 mg per tablet   Yes Yes   Sig: Take 1 Tab by mouth every six (6) hours as needed for Pain.   spironolactone (ALDACTONE) 100 mg tablet   No Yes   Sig: Take 1 Tab by mouth daily.   traZODone (DESYREL) 100 mg tablet   No Yes   Sig: Take 1 Tab by mouth nightly.      Facility-Administered Medications: None       Allergies     Allergies   Allergen Reactions   ??? Chocolate [Cocoa] Sneezing       Physical Exam   ED Triage Vitals   Enc Vitals Group      BP 12/18/15 2024 126/89      Pulse (Heart Rate) 12/18/15 2024 90      Resp Rate 12/18/15 2024 18      Temp 12/18/15 2024 98.5 ??F (36.9 ??C)      Temp src --       O2 Sat (%) 12/18/15 2024 97 %      Weight 12/18/15 1958 191 lb      Height 12/18/15 1958 5' 1"      Constitutional: Patient appears well developed and well nourished. Appearance and behavior are age and situation appropriate.  HEENT: Conjunctiva clear.  PERRLA. Mucous membranes moist, non-erythematous. Surface of the pharynx, palate, and tongue are pink, moist and without lesions.  Neck: Symmetrical, no masses. Normal range of motion.  Respiratory: Lungs clear to auscultation, nonlabored respirations. No tachypnea or accessory muscle use.  Cardiovascular: Heart regular rate and rhythm without murmur rubs or gallops. Calves soft and non-tender. Distal pulses 2+ and equal bilaterally.  No peripheral edema or significant variscosities.  Gastrointestinal:  Abdomen soft, nontender without complaint of pain to palpation.  Musculoskeletal: No deformities of the limbs. Patient with full passive range of motion of the right hip in all directions. Tenderness palpation  along the right greater trochanter and the lateral aspect of the right femur. No bony step-offs palpated.  Integumentary: Warm and dry without rashes or lesions noted warmth or redness to any aspect of the right lower extremity.  Neurologic: Alert and moving all limbs spontaneously.    Impression and Management Plan   65 year old female presents the ED with acute on chronic right hip pain. Patient says that this is the exact same pain she's had with her last visit here. He did have a normal x-ray of the right hip in May that showed no movement of hardware. I do not think we need to re-x-ray the hip as she denies any falls or injury. Patient calves are soft do not suspect DVT. Patient is here for pain medication. Discussed with patient that we do not write opioid prescriptions for chronic pain. Patient says she understands. I will however, give her a shot of morphine as she has a ride. Will give her Robaxin as well.  Diagnostic Studies     Lab:   No results found for this or any previous visit (from the past 24 hour(s)).    Imaging:    No results found.  ED Course   Patient remained stable throughout her stay.    Medical Decision Making   Patient safe for discharge. Will write a prescription for tizanidine as her insurance will cover this. Checked her to use ibuprofen every 8 hours for inflammation. She is to follow-up with her primary care physician as well as pain management. Return to the ED for any new or worsening symptoms.    Final Diagnosis     1. Chronic right hip pain        Disposition   The patient was discharged in stable condition will follow-up as outlined above.    The patient was personally evaluated by myself and discussed with Dr. Alisa Graff who agrees with the above assessment and plan.      Barros Loffler, PA-C  December 18, 2015    My signature above authenticates this document and my orders, the final ??  diagnosis (es), discharge prescription (s), and instructions in the Epic ??  record.   If you have any questions please contact [phone].  ??  Nursing notes have been reviewed by the physician/ advanced practice ??  Clinician.

## 2015-12-18 NOTE — ED Triage Notes (Signed)
C/o r hip pain x 2 days, worse with movement, denies injury, states post hip replacement 5 months ago.

## 2015-12-18 NOTE — ED Notes (Signed)
Discharge instructions reviewed with patient. Prescriptions for Zanaflex and Motrin was also reviewed with patient. Patient verbalized understanding. Opportunity for questions and clarifications  was provided. Patient to assess again by this RN in since pt was just medicated with Morphine and will discharge to home

## 2015-12-18 NOTE — ED Notes (Signed)
Pt discharged to home

## 2015-12-19 MED ORDER — METHOCARBAMOL 750 MG TAB
750 mg | ORAL | Status: AC
Start: 2015-12-19 — End: 2015-12-18
  Administered 2015-12-19: 02:00:00 via ORAL

## 2015-12-19 MED ORDER — ONDANSETRON 4 MG TAB, RAPID DISSOLVE
4 mg | ORAL | Status: AC
Start: 2015-12-19 — End: 2015-12-18
  Administered 2015-12-19: 02:00:00 via SUBLINGUAL

## 2015-12-19 MED ORDER — MORPHINE 4 MG/ML SYRINGE
4 mg/mL | INTRAMUSCULAR | Status: DC
Start: 2015-12-19 — End: 2015-12-18

## 2015-12-19 MED ORDER — TIZANIDINE 2 MG TAB
2 mg | ORAL_TABLET | Freq: Three times a day (TID) | ORAL | 0 refills | Status: DC
Start: 2015-12-19 — End: 2016-07-23

## 2015-12-19 MED ORDER — MORPHINE 4 MG/ML SYRINGE
4 mg/mL | INTRAMUSCULAR | Status: AC
Start: 2015-12-19 — End: 2015-12-18
  Administered 2015-12-19: 02:00:00 via INTRAMUSCULAR

## 2015-12-19 MED ORDER — METHOCARBAMOL 500 MG TAB
500 mg | ORAL | Status: AC
Start: 2015-12-19 — End: 2015-12-18

## 2015-12-19 MED ORDER — IBUPROFEN 600 MG TAB
600 mg | ORAL_TABLET | Freq: Three times a day (TID) | ORAL | 0 refills | Status: DC | PRN
Start: 2015-12-19 — End: 2016-07-27

## 2015-12-19 MED FILL — METHOCARBAMOL 500 MG TAB: 500 mg | ORAL | Qty: 2

## 2015-12-19 MED FILL — MORPHINE 4 MG/ML SYRINGE: 4 mg/mL | INTRAMUSCULAR | Qty: 2

## 2015-12-19 MED FILL — ONDANSETRON 4 MG TAB, RAPID DISSOLVE: 4 mg | ORAL | Qty: 1

## 2015-12-19 MED FILL — METHOCARBAMOL 750 MG TAB: 750 mg | ORAL | Qty: 1

## 2015-12-22 ENCOUNTER — Ambulatory Visit

## 2015-12-22 ENCOUNTER — Inpatient Hospital Stay: Admit: 2015-12-22 | Payer: MEDICARE | Attending: Specialist | Primary: Physician Assistant

## 2015-12-22 ENCOUNTER — Encounter

## 2015-12-22 DIAGNOSIS — E042 Nontoxic multinodular goiter: Secondary | ICD-10-CM

## 2015-12-22 MED ORDER — FLUTICASONE 50 MCG/ACTUATION NASAL SPRAY, SUSP
50 mcg/actuation | Freq: Every day | NASAL | 1 refills | Status: DC
Start: 2015-12-22 — End: 2016-07-23

## 2015-12-22 MED ORDER — LIDOCAINE (PF) 10 MG/ML (1 %) IJ SOLN
10 mg/mL (1 %) | Freq: Once | INTRAMUSCULAR | Status: AC
Start: 2015-12-22 — End: 2015-12-22
  Administered 2015-12-22: 19:00:00 via SUBCUTANEOUS

## 2015-12-25 NOTE — Telephone Encounter (Signed)
This has been done, PCP changed and referral put in but could only backdate to 12/12/2015

## 2015-12-26 ENCOUNTER — Inpatient Hospital Stay
Admit: 2015-12-26 | Discharge: 2015-12-27 | Disposition: A | Discharge status: Home / Self Care | Payer: MEDICARE | Attending: Emergency Medicine

## 2015-12-26 DIAGNOSIS — M25551 Pain in right hip: Secondary | ICD-10-CM

## 2015-12-26 MED ORDER — METHOCARBAMOL 750 MG TAB
750 mg | ORAL_TABLET | Freq: Four times a day (QID) | ORAL | 0 refills | Status: DC
Start: 2015-12-26 — End: 2016-01-01

## 2015-12-26 MED ORDER — HYDROMORPHONE (PF) 1 MG/ML IJ SOLN
1 mg/mL | INTRAMUSCULAR | Status: AC
Start: 2015-12-26 — End: 2015-12-26
  Administered 2015-12-26: 23:00:00 via INTRAMUSCULAR

## 2015-12-26 MED ORDER — METHOCARBAMOL 750 MG TAB
750 mg | ORAL | Status: AC
Start: 2015-12-26 — End: 2015-12-26
  Administered 2015-12-26: 23:00:00 via ORAL

## 2015-12-26 MED ORDER — HYDROMORPHONE (PF) 1 MG/ML IJ SOLN
1 mg/mL | INTRAMUSCULAR | Status: DC
Start: 2015-12-26 — End: 2015-12-26

## 2015-12-26 MED ORDER — METHOCARBAMOL 750 MG TAB
750 mg | ORAL_TABLET | Freq: Four times a day (QID) | ORAL | 0 refills | Status: DC
Start: 2015-12-26 — End: 2015-12-26

## 2015-12-26 MED ORDER — IBUPROFEN 600 MG TAB
600 mg | ORAL_TABLET | Freq: Four times a day (QID) | ORAL | 0 refills | Status: DC | PRN
Start: 2015-12-26 — End: 2016-06-10

## 2015-12-26 MED FILL — METHOCARBAMOL 750 MG TAB: 750 mg | ORAL | Qty: 1

## 2015-12-26 MED FILL — HYDROMORPHONE (PF) 1 MG/ML IJ SOLN: 1 mg/mL | INTRAMUSCULAR | Qty: 1

## 2015-12-26 NOTE — ED Notes (Signed)
8:29 PM  12/26/15     [facility] with verbalization of understanding. Patient accompanied by family.  Patient discharged with the following prescriptions n/a. Patient discharged to home (destination).      Gretta Beganhassidy A Mobley, RN

## 2015-12-26 NOTE — ED Provider Notes (Signed)
[facility]  Emergency Department Treatment Report    Patient: [name] [name] Age: 65 y.o. Sex: female    Date of Birth: 31-Jan-1951 Admit Date: 12/26/2015 PCP: Lake Bells, MD   MRN: [id number]  CSN: [id number]     Room: ER50/ER50 Time Dictated: 6:09 PM      I hereby certify this patient for admission based upon medical necessity as ??  noted below:    Chief Complaint   Hip pain  History of Present Illness   65 y.o. female complaint of hip pain starting yesterday. She denies fall or feeling of instability and states that it just hurts.Patient takes ibuprofen for pain at home, but it isn't working. Patient has been transferred to pain management.     Review of Systems   Constitutional: No fever, chills, or weight loss  Eyes: No visual symptoms.  ENT: No sore throat, runny nose or ear pain.  Respiratory: No cough, dyspnea or wheezing.  Cardiovascular: No chest pain, pressure, palpitations, tightness or heaviness.  Gastrointestinal: No vomiting, diarrhea or abdominal pain.  Genitourinary: No dysuria, frequency, or urgency.  Musculoskeletal: No joint pain or swelling.  Integumentary: No rashes.  Neurological: No headaches, sensory or motor symptoms.  Denies complaints in all other systems.    Past Medical/Surgical History     Past Medical History:   Diagnosis Date   ??? Arthritis    ??? Chronic pain    ??? Diabetes ([place])    ??? GERD (gastroesophageal reflux disease)    ??? Hypertension    ??? Liver disease     HEP C   ??? PTSD (post-traumatic stress disorder)     lived through the [place] in 1993     Past Surgical History:   Procedure Laterality Date   ??? HX ENDOSCOPY     ??? HX GYN  2010    hysterectomy   ??? HX HIP REPLACEMENT Right 01/29/2015   ??? HX HIP REPLACEMENT         Social History     Social History     Social History   ??? Marital status: MARRIED     Spouse name: N/A   ??? Number of children: N/A   ??? Years of education: N/A     Social History Main Topics   ??? Smoking status: Never Smoker    ??? Smokeless tobacco: Never Used   ??? Alcohol use No   ??? Drug use: No   ??? Sexual activity: No     Other Topics Concern   ??? None     Social History Narrative       Family History     Family History   Problem Relation Age of Onset   ??? Hypertension Mother    ??? Cancer Maternal Aunt    ??? Alcohol abuse Maternal Grandfather        Current Medications     Prior to Admission Medications   Prescriptions Last Dose Informant Patient Reported? Taking?   Blood-Glucose Meter monitoring kit   No No   Sig: Check sugars 2 times per day, fasting and 2 hours after dinner   Lancets misc   No No   Sig: Check sugars twice daily   QUEtiapine SR (SEROQUEL XR) 150 mg sr tablet   No No   Sig: TAKE 1 TABLET BY MOUTH NIGHTLY   albuterol (PROVENTIL HFA, VENTOLIN HFA, PROAIR HFA) 90 mcg/actuation inhaler   No No   Sig: Take 2 Puffs  by inhalation every six (6) hours as needed for Wheezing or Shortness of Breath.   amLODIPine (NORVASC) 10 mg tablet   No No   Sig: TAKE ONE TABLET BY MOUTH ONCE DAILY   cloNIDine HCl (CATAPRES) 0.3 mg tablet   No No   Sig: TAKE ONE TABLET BY MOUTH TWICE DAILY FOR 30  DAYS   diclofenac EC (VOLTAREN) 75 mg EC tablet   No No   Sig: Take 1 Tab by mouth two (2) times daily as needed.   fluticasone (FLONASE) 50 mcg/actuation nasal spray   No No   Sig: 2 Sprays by Both Nostrils route daily.   gabapentin (NEURONTIN) 600 mg tablet   No No   Sig: TAKE 1 TABLET BY MOUTH THREE TIMES DAILY   glucose blood VI test strips (ASCENSIA AUTODISC VI, ONE TOUCH ULTRA TEST VI) strip   No No   Sig: Check sugars twice daily   ibuprofen (MOTRIN) 600 mg tablet   No No   Sig: Take 1 Tab by mouth every eight (8) hours as needed for Pain.   losartan (COZAAR) 100 mg tablet   No No   Sig: Take 1 Tab by mouth daily. Indications: hypertension   magnesium oxide (MAG-OX) 400 mg tablet   No No   Sig: Take 1 Tab by mouth daily.   metFORMIN ER (GLUCOPHAGE XR) 750 mg tablet   No No   Sig: TAKE 2 TABLETS EVERY DAY    oxyCODONE-acetaminophen (PERCOCET 10) 10-325 mg per tablet   Yes No   Sig: Take 1 Tab by mouth every six (6) hours as needed for Pain.   spironolactone (ALDACTONE) 100 mg tablet   No No   Sig: Take 1 Tab by mouth daily.   tiZANidine (ZANAFLEX) 2 mg tablet   No No   Sig: Take 1 Tab by mouth three (3) times daily.   traZODone (DESYREL) 100 mg tablet   No No   Sig: Take 1 Tab by mouth nightly.      Facility-Administered Medications: None      Allergies     Allergies   Allergen Reactions   ??? Chocolate [Cocoa] Sneezing     Physical Exam   ED Triage Vitals   Enc Vitals Group      BP 12/26/15 1720 103/85      Pulse (Heart Rate) 12/26/15 1720 106      Resp Rate 12/26/15 1720 18      Temp 12/26/15 1720 99.3 ??F (37.4 ??C)      Temp src --       O2 Sat (%) 12/26/15 1720 95 %      Weight 12/26/15 1707 190 lb      Height 12/26/15 1707 5' 7"     --     --     --     --     --     --      Constitutional: Patient appears well developed and well nourished. Marland Kitchen Appearance and behavior are age and situation appropriate.  HEENT: Conjunctiva clear.  PERRLA. Mucous membranes moist, non-erythematous. Surface of the pharynx, palate, and tongue are pink, moist and without lesions.  Neck: supple, non tender, symmetrical, no masses or JVD.   Respiratory: lungs clear to auscultation, nonlabored respirations. No tachypnea or accessory muscle use.  Cardiovascular: heart regular rate and rhythm without murmur rubs or gallops.   Calves soft and non-tender. Distal pulses 2+ and equal bilaterally.  No peripheral edema or significant  variscosities.    Gastrointestinal:  Abdomen soft, nontender without complaint of pain to palpation  Musculoskeletal: Pain with palpation of right hip and with motion of the pelvis.   Integumentary: warm and dry without rashes or lesions  Neurologic: alert and oriented, Sensation intact, motor strength equal and symmetric.  No facial asymmetry or dysarthria.              Impression and Management Plan    Patient is a 65 year old lady who is here with hip pain and acute on chronic. She takes Cipro for home for pain and this is been unsuccessful in relieving it. Patient has been for pain relief today. She has a muscle relaxers at home. No imaging is planned at this point. Will medicate and reassess for improvement.  Diagnostic Studies   Lab:   No results found for this or any previous visit (from the past 12 hour(s)).    Imaging:    No results found.    ED Course   Patient medicated here with pain reliever and muscle relaxer. Patient was visualized up into the bathroom with the assistance of her husband. Stated that she felt significantly better. Patient was discharged home with a prescription for muscle relaxer and anti-inflammatories.  Medical Decision Making   Patient is to follow-up with pain management as scheduled.  Final Diagnosis       ICD-10-CM ICD-9-CM   1. Pain of right hip joint M25.551 719.45       Disposition   Discharged home    Saintclair Halsted, DO  December 27, 2015    My signature above authenticates this document and my orders, the final ??  diagnosis (es), discharge prescription (s), and instructions in the Epic ??  record.  If you have any questions please contact [phone].  ??  Nursing notes have been reviewed by the physician/ advanced practice ??  Clinician.

## 2015-12-26 NOTE — ED Triage Notes (Signed)
Had R hip surgery a few months ago, pt has had ongoing R hip pain, primary aware and told patient that she needed to see pain management.  Pain management has yet to contact her.  Waiting to see if they will even see her and then set up appointment.

## 2016-01-01 ENCOUNTER — Inpatient Hospital Stay
Admit: 2016-01-01 | Discharge: 2016-01-01 | Disposition: A | Discharge status: Home / Self Care | Payer: MEDICARE | Attending: Emergency Medicine

## 2016-01-01 DIAGNOSIS — M25551 Pain in right hip: Secondary | ICD-10-CM

## 2016-01-01 MED ORDER — METHOCARBAMOL 750 MG TAB
750 mg | ORAL_TABLET | Freq: Four times a day (QID) | ORAL | 0 refills | Status: DC
Start: 2016-01-01 — End: 2016-07-23

## 2016-01-01 NOTE — ED Notes (Signed)
5:25 PM  01/01/16     Discharge instructions given to patient (name) with verbalization of understanding. Patient accompanied by self.  Patient discharged with the following prescriptions robaxin. Patient discharged to home (destination).      Myna Brightallie Ann Coviello, RN

## 2016-01-01 NOTE — ED Triage Notes (Signed)
Pt had a right hip replacement 1 yr ago. C/o pain to right hip pain radiating down the back of right leg. Pain has been ongoing since the replacement.

## 2016-01-01 NOTE — ED Provider Notes (Signed)
[place]  Emergency Department Treatment Report    Patient: [name] [name] Age: 65 y.o. Sex: female    Date of Birth: 1951/03/26 Admit Date: 01/01/2016 PCP: Lake Bells, MD   MRN: [id number]  CSN: [id number]     Room: ER49/ER49 Time Dictated: 5:06 PM      Chief Complaint   Leg pain  History of Present Illness   65 y.o. female with complaints of right hip pain radiates to her right lower extremity. Patient with a history of chronic pain. This is present for the past year after right hip replacement. She's been seen several times for this in ER, at her  primary care and currently referred her to pain management at [facility]. Her last evaluation was weeks ago with a recent negative x-ray of the right hip. Patient denies any recent history of trauma. She denies any redness or swelling to the hip. Pain is her regular pain that she has a flareup intermittently.    Review of Systems   Constitutional: No fever, chills, or weight loss  Eyes: No visual symptoms.  ENT: No sore throat, runny nose or ear pain.  Respiratory: No cough, dyspnea or wheezing.  Cardiovascular: No chest pain, pressure, palpitations, tightness or heaviness.  Gastrointestinal: No vomiting, diarrhea or abdominal pain.  Genitourinary: No dysuria, frequency, or urgency.  Musculoskeletal: Right hip pain  Integumentary: No rashes.  Neurological: No headaches, sensory or motor symptoms.  Denies complaints in all other systems.    Past Medical/Surgical History     Past Medical History:   Diagnosis Date   ??? Arthritis    ??? Chronic pain    ??? Diabetes ([place])    ??? GERD (gastroesophageal reflux disease)    ??? Hypertension    ??? Liver disease     HEP C   ??? PTSD (post-traumatic stress disorder)     lived through the [place] in 1993     Past Surgical History:   Procedure Laterality Date   ??? HX ENDOSCOPY     ??? HX GYN  2010    hysterectomy   ??? HX HIP REPLACEMENT Right 01/29/2015   ??? HX HIP REPLACEMENT         Social History      Social History     Social History   ??? Marital status: MARRIED     Spouse name: N/A   ??? Number of children: N/A   ??? Years of education: N/A     Social History Main Topics   ??? Smoking status: Never Smoker   ??? Smokeless tobacco: Never Used   ??? Alcohol use No   ??? Drug use: No   ??? Sexual activity: No     Other Topics Concern   ??? Not on file     Social History Narrative       Family History     Family History   Problem Relation Age of Onset   ??? Hypertension Mother    ??? Cancer Maternal Aunt    ??? Alcohol abuse Maternal Grandfather        Current Medications     Prior to Admission medications    Medication Sig Start Date End Date Taking? Authorizing Provider   methocarbamol (ROBAXIN-750) 750 mg tablet Take 1 Tab by mouth four (4) times daily. 01/01/16  Yes Dawayne Patricia, PA   ibuprofen (MOTRIN) 600 mg tablet Take 1 Tab by mouth every six (6) hours as needed  for Pain. 12/26/15   Marissa Calamity Salch, DO   fluticasone (FLONASE) 50 mcg/actuation nasal spray 2 Sprays by Both Nostrils route daily. 12/22/15   Lake Bells, MD   oxyCODONE-acetaminophen (PERCOCET 10) 10-325 mg per tablet Take 1 Tab by mouth every six (6) hours as needed for Pain.    Phys Other, MD   ibuprofen (MOTRIN) 600 mg tablet Take 1 Tab by mouth every eight (8) hours as needed for Pain. 12/18/15   Pennock Loffler, PA-C   tiZANidine (ZANAFLEX) 2 mg tablet Take 1 Tab by mouth three (3) times daily. 12/18/15   Popper Loffler, PA-C   amLODIPine (NORVASC) 10 mg tablet TAKE ONE TABLET BY MOUTH ONCE DAILY 12/16/15   Lake Bells, MD   metFORMIN ER (GLUCOPHAGE XR) 750 mg tablet TAKE 2 TABLETS EVERY DAY 11/30/15   Lake Bells, MD   traZODone (DESYREL) 100 mg tablet Take 1 Tab by mouth nightly. 11/23/15   Jolson Dyann Kief, MD   losartan (COZAAR) 100 mg tablet Take 1 Tab by mouth daily. Indications: hypertension 11/16/15   Lake Bells, MD   QUEtiapine SR (SEROQUEL XR) 150 mg sr tablet TAKE 1 TABLET BY MOUTH NIGHTLY 11/02/15   Lake Bells, MD    gabapentin (NEURONTIN) 600 mg tablet TAKE 1 TABLET BY MOUTH THREE TIMES DAILY 11/02/15   Lake Bells, MD   cloNIDine HCl (CATAPRES) 0.3 mg tablet TAKE ONE TABLET BY MOUTH TWICE DAILY FOR 30  DAYS 10/26/15   Lake Bells, MD   magnesium oxide (MAG-OX) 400 mg tablet Take 1 Tab by mouth daily. 10/20/15   Jolson Dyann Kief, MD   spironolactone (ALDACTONE) 100 mg tablet Take 1 Tab by mouth daily. 10/20/15   Jolson Dyann Kief, MD   diclofenac EC (VOLTAREN) 75 mg EC tablet Take 1 Tab by mouth two (2) times daily as needed. 08/27/15   Rollene Rotunda, MD   Blood-Glucose Meter monitoring kit Check sugars 2 times per day, fasting and 2 hours after dinner 07/15/15   Lake Bells, MD   Lancets misc Check sugars twice daily 07/15/15   Lake Bells, MD   glucose blood VI test strips (ASCENSIA AUTODISC VI, ONE TOUCH ULTRA TEST VI) strip Check sugars twice daily 07/15/15   Lake Bells, MD   albuterol (PROVENTIL HFA, VENTOLIN HFA, PROAIR HFA) 90 mcg/actuation inhaler Take 2 Puffs by inhalation every six (6) hours as needed for Wheezing or Shortness of Breath. 06/16/15   Rosine Marlynn Perking, FNP       Allergies     Allergies   Allergen Reactions   ??? Chocolate [Cocoa] Sneezing       Physical Exam     ED Triage Vitals   Enc Vitals Group      BP 01/01/16 1632 114/93      Pulse (Heart Rate) 01/01/16 1632 91      Resp Rate 01/01/16 1632 16      Temp 01/01/16 1632 97.5 ??F (36.4 ??C)      O2 Sat (%) 01/01/16 1632 98 %      Weight 01/01/16 1628 190 lb      Height 01/01/16 1628 5' 7"     Constitutional: Patient appears well developed and well nourished. Marland Kitchen Appearance and behavior are age and situation appropriate.  HEENT: Conjunctiva clear.  PERRL. Mucous membranes moist, non-erythematous. Neck: supple, non tender, symmetrical, no masses or JVD.   Cardiovascular:  Calves soft and non-tender.  Distal pulses 2+ and equal bilaterally.  No peripheral edema or significant variscosities.     Gastrointestinal:  Abdomen soft, nontender without complaint of pain to palpation  Musculoskeletal: No edema, erythema, or hyperthermia at the right hip. Well-healed lateral incisional scar. Right lower extremity is not shortened or rotated. Pain is worse with internal and external rotation and flexion of the hip.  Integumentary: warm and dry without rashes or lesions  Neurologic: alert and oriented, Sensation intact, motor strength equal and symmetric.    Impression and Management Plan     65 year old afebrile nontoxic female who does not prevent with a fever,  Tachycardia,with  this right hip pain.  We will review old records.    Diagnostic Studies     Lab:     No results found for this or any previous visit (from the past 12 hour(s)).    Imaging:    Final result (Exam End: 07/05/2015 11:35 AM) Open    Study Result   Indication: Right hip pain for 5 months  ??  Frontal view pelvis and 2 lateral views of the right hip show bipolar prosthesis  in expected position on the right. No fracture or dislocation. Advanced joint  space narrowing present left hip with normal femoral head contour. Metallic  staple left pelvis.  ??  IMPRESSION  IMPRESSION: No acute abnormality. 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  ??  INDICATION: Pain.  ??  FINDINGS: Right hip prosthesis in good position. No evidence for loosening or  dislocation. Significant arthritis involving the left hip.  ??  IMPRESSION  IMPRESSION: No acute abnormalities.  ??           ED Course     Patient remained stable while in the emergency department. Old records were reviewed in patient's had recent imaging with right hip in good position couple with no evidence of acute infection at this time patient with what appears to be chronic hip pain. Did discuss in detail. Chronic pain is managed best with primary care physician.  More than happy to  write for muscle relaxant. Patient is currently on diclofenac. Augment with tylenol for pain.       Final Diagnosis       ICD-10-CM ICD-9-CM   1. Chronic right hip pain M25.551 719.45    G89.29 338.29     Disposition     Patient discharged stable to home. She is to follow-up with her pain management doctor or primary care physician. Return to ED if symptoms persist or worsen. Prescription for Robaxin.    Dawayne Patricia, MPA, PA-C  January 01, 2016        The patient was personally evaluated by myself and Dr. Waldon Reining who agrees with the above assessment and plan.    My signature above authenticates this document and my orders, the final ??  diagnosis (es), discharge prescription (s), and instructions in the Epic ??  record.  If you have any questions please contact [phone].  ??  Nursing notes have been reviewed by the physician/ advanced practice ??  Clinician.

## 2016-01-14 ENCOUNTER — Encounter

## 2016-01-15 MED ORDER — MAGNESIUM OXIDE 400 MG TAB
400 mg | ORAL_TABLET | Freq: Every day | ORAL | 2 refills | Status: DC
Start: 2016-01-15 — End: 2016-05-08

## 2016-01-18 ENCOUNTER — Encounter

## 2016-01-18 NOTE — Telephone Encounter (Signed)
Patient wanted to leave her cell phone number [phone] for Judeth CornfieldStephanie to return her call.

## 2016-01-19 ENCOUNTER — Encounter: Admit: 2016-01-19 | Primary: Physician Assistant

## 2016-01-19 ENCOUNTER — Ambulatory Visit: Admit: 2016-01-19 | Payer: MEDICARE | Attending: Physician Assistant | Primary: Physician Assistant

## 2016-01-19 DIAGNOSIS — R059 Cough, unspecified: Secondary | ICD-10-CM

## 2016-01-19 DIAGNOSIS — M160 Bilateral primary osteoarthritis of hip: Secondary | ICD-10-CM

## 2016-01-19 MED ORDER — IPRATROPIUM BROMIDE 0.02 % SOLN FOR INHALATION
0.02 % | RESPIRATORY_TRACT | 0 refills | Status: AC
Start: 2016-01-19 — End: 2016-01-19

## 2016-01-19 MED ORDER — KETOROLAC TROMETHAMINE 60 MG/2 ML IM
60 mg/2 mL | Freq: Once | INTRAMUSCULAR | 0 refills | Status: AC
Start: 2016-01-19 — End: 2016-01-19

## 2016-01-19 MED ORDER — IPRATROPIUM 20 MCG-ALBUTEROL 100 MCG/ACTUATION MIST FOR INHALATION
20-100 mcg/actuation | RESPIRATORY_TRACT | 1 refills | Status: DC | PRN
Start: 2016-01-19 — End: 2016-08-01

## 2016-01-19 MED ORDER — ALBUTEROL SULFATE 0.083 % (0.83 MG/ML) SOLN FOR INHALATION
2.5 mg /3 mL (0.083 %) | Freq: Once | RESPIRATORY_TRACT | 0 refills | Status: DC
Start: 2016-01-19 — End: 2016-01-19

## 2016-01-19 MED ORDER — ALBUTEROL SULFATE 0.63 MG/3 ML SOLN FOR INHALATION
0.63 mg/3 mL | Freq: Four times a day (QID) | RESPIRATORY_TRACT | 0 refills | Status: DC | PRN
Start: 2016-01-19 — End: 2016-08-01

## 2016-01-19 MED ORDER — KETOROLAC TROMETHAMINE 60 MG/2 ML IM
60 mg/2 mL | Freq: Once | INTRAMUSCULAR | 0 refills | Status: DC
Start: 2016-01-19 — End: 2016-01-19

## 2016-01-19 MED ORDER — AZITHROMYCIN 250 MG TAB
250 mg | ORAL_TABLET | ORAL | 0 refills | Status: AC
Start: 2016-01-19 — End: 2016-01-24

## 2016-01-19 NOTE — Progress Notes (Signed)
[name] [name] is a 65 y.o. female here today with c/o hip pain and would like an injection for pain. She also is c/o sinus issues and a cough since yesterday. Denies fever.    Learning assessment previously completed.    1. Have you been to the ER, urgent care clinic or hospitalized since your last visit? [facility] but she states they refused to see her due to having a pain management dr.    2. Have you seen or consulted any other health care providers outside of the [facility] since your last visit (Include any pap smears or colon screening)? no     Do you have an Advanced Directive? yes    Would you like information on Advanced Directives? no

## 2016-01-19 NOTE — Addendum Note (Signed)
Addended by: Ignatius Specking'HARA, Lillan Mccreadie D on: 01/19/2016 02:41 PM      Modules accepted: Orders

## 2016-01-19 NOTE — Telephone Encounter (Signed)
Pt made aware
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
## 2016-01-19 NOTE — Addendum Note (Signed)
Addended by: Lennox GrumblesBROCK, Stace Peace on: 01/19/2016 05:05 PM      Modules accepted: Orders

## 2016-01-19 NOTE — Addendum Note (Signed)
Addended by: Lennox GrumblesBROCK, Glory Graefe on: 01/19/2016 02:58 PM      Modules accepted: Orders

## 2016-01-19 NOTE — Patient Instructions (Signed)
Ketorolac (Toradol) - (By mouth)   Why this medicine is used:   Treats severe pain.  Contact a nurse or doctor right away if you have:  ?? Blistering, peeling, red skin rash  ?? Yellow skin or eyes  ?? Bloody vomit or vomit that looks like coffee grounds; bloody or black, tarry stools  ?? Dark urine or pale stools, change in how much or how often you urinate  ?? Nausea, vomiting, loss of appetite, stomach pain     Common side effects:  ?? Changes in your vision, ringing in your ears  ?? Diarrhea, constipation, indigestion  ?? Headache  ?? 2017 [employer] Information is for End User's use only and may not be sold, redistributed or otherwise used for commercial purposes.

## 2016-01-21 NOTE — Progress Notes (Signed)
17mm density seen on CXR. Looks like she had a chest CT 12/03/15 which showed a 19mm lung nodule in the same location, as well as severe COPD. Can you call and see how she is feeling? Also, make sure she has a f/u scheduled with Dr. Jac Canavanharakan.

## 2016-01-22 NOTE — Progress Notes (Signed)
Pt aware of results and is following up with Dr. Jac Canavanharakan next week

## 2016-01-26 ENCOUNTER — Ambulatory Visit: Admit: 2016-01-26 | Discharge: 2016-01-26 | Payer: MEDICARE | Attending: Family Medicine | Primary: Physician Assistant

## 2016-01-26 DIAGNOSIS — G8929 Other chronic pain: Secondary | ICD-10-CM

## 2016-01-26 MED ORDER — KETOROLAC TROMETHAMINE 30 MG/ML INJECTION
30 mg/mL (1 mL) | Freq: Once | INTRAMUSCULAR | 0 refills | Status: AC
Start: 2016-01-26 — End: 2016-01-26

## 2016-01-26 MED ORDER — OXYCODONE-ACETAMINOPHEN 10 MG-325 MG TAB
10-325 mg | ORAL_TABLET | Freq: Three times a day (TID) | ORAL | 0 refills | Status: DC | PRN
Start: 2016-01-26 — End: 2016-07-27

## 2016-01-26 NOTE — Patient Instructions (Signed)
Chronic Pain: Care Instructions  Your Care Instructions  Chronic pain is pain that lasts a long time (months or even years) and may or may not have a clear cause. It is different from acute pain, which usually does have a clear cause???like an injury or illness???and gets better over time. Chronic pain:  ?? Lasts over time but may vary from day to day.  ?? Does not go away despite efforts to end it.  ?? May disrupt your sleep and lead to fatigue.  ?? May cause depression or anxiety.  ?? May make your muscles tense, causing more pain.  ?? Can disrupt your work, hobbies, home life, and relationships with friends and family.  Chronic pain is a very real condition. It is not just in your head. Treatment can help and usually includes several methods used together, such as medicines, physical therapy, exercise, and other treatments. Learning how to relax and changing negative thought patterns can also help you cope.  Chronic pain is complex. Taking an active role in your treatment will help you better manage your pain. Tell your doctor if you have trouble dealing with your pain. You may have to try several things before you find what works best for you.  Follow-up care is a key part of your treatment and safety. Be sure to make and go to all appointments, and call your doctor if you are having problems. It???s also a good idea to know your test results and keep a list of the medicines you take.  How can you care for yourself at home?  ?? Pace yourself. Break up large jobs into smaller tasks. Save harder tasks for days when you have less pain, or go back and forth between hard tasks and easier ones. Take rest breaks.  ?? Relax, and reduce stress. Relaxation techniques such as deep breathing or meditation can help.  ?? Keep moving. Gentle, daily exercise can help reduce pain over the long run. Try low- or no-impact exercises such as walking, swimming, and stationary biking. Do stretches to stay flexible.   ?? Try heat, cold packs, and massage.  ?? Get enough sleep. Chronic pain can make you tired and drain your energy. Talk with your doctor if you have trouble sleeping because of pain.  ?? Think positive. Your thoughts can affect your pain level. Do things that you enjoy to distract yourself when you have pain instead of focusing on the pain. See a movie, read a book, listen to music, or spend time with a friend.  ?? If you think you are depressed, talk to your doctor about treatment.  ?? Keep a daily pain diary. Record how your moods, thoughts, sleep patterns, activities, and medicine affect your pain. You may find that your pain is worse during or after certain activities or when you are feeling a certain emotion. Having a record of your pain can help you and your doctor find the best ways to treat your pain.  ?? Take pain medicines exactly as directed.  ?? If the doctor gave you a prescription medicine for pain, take it as prescribed.  ?? If you are not taking a prescription pain medicine, ask your doctor if you can take an over-the-counter medicine.  Reducing constipation caused by pain medicine  ?? Include fruits, vegetables, beans, and whole grains in your diet each day. These foods are high in fiber.  ?? Drink plenty of fluids, enough so that your urine is light yellow or clear like water. If you   have kidney, heart, or liver disease and have to limit fluids, talk with your doctor before you increase the amount of fluids you drink.  ?? If your doctor recommends it, get more exercise. Walking is a good choice. Bit by bit, increase the amount you walk every day. Try for at least 30 minutes on most days of the week.  ?? Schedule time each day for a bowel movement. A daily routine may help. Take your time and do not strain when having a bowel movement.  When should you call for help?  Call your doctor now or seek immediate medical care if:  ?? Your pain gets worse or is out of control.   ?? You feel down or blue, or you do not enjoy things like you once did. You may be depressed, which is common in people with chronic pain. Depression can be treated.  ?? You have vomiting or cramps for more than 2 hours.  Watch closely for changes in your health, and be sure to contact your doctor if:  ?? You cannot sleep because of pain.  ?? You are very worried or anxious about your pain.  ?? You have trouble taking your pain medicine.  ?? You have any concerns about your pain medicine.  ?? You have trouble with bowel movements, such as:  ?? No bowel movement in 3 days.  ?? Blood in the anal area, in your stool, or on the toilet paper.  ?? Diarrhea for more than 24 hours.  Where can you learn more?  Go to http://www.healthwise.net/[employer].  Enter N004 in the search box to learn more about "Chronic Pain: Care Instructions."  Current as of: March 27, 2015  Content Version: 11.3  ?? 2006-2017 [employer]. Care instructions adapted under license by [employer] (which disclaims liability or warranty for this information). If you have questions about a medical condition or this instruction, always ask your healthcare professional. [employer] disclaims any warranty or liability for your use of this information.

## 2016-01-26 NOTE — Progress Notes (Signed)
[name] ShutterMonica R [name] is a 65 y.o. female in today for follow-up

## 2016-01-26 NOTE — Progress Notes (Signed)
Hip Pain        HPI: [name] [name] is a 65 y.o. female [place]  Here in follow up of her chronic right hip pain. She is s/p R hip arthroplasty nearly 1 year ago by Dr Salome Arnt however her hip pain has never improved. Exacerbated by movement, alleviated by rest.  However her pain is constant. She is scheduled to see pain management in 4 weeks.  She has been on recurrent opiate therapy for this pain.          Past Medical History:   Diagnosis Date   ??? Arthritis    ??? Chronic pain    ??? Diabetes ([place])    ??? GERD (gastroesophageal reflux disease)    ??? Hypertension    ??? Liver disease     HEP C   ??? PTSD (post-traumatic stress disorder)     lived through [place] in 1993       Current Outpatient Prescriptions   Medication Sig   ??? albuterol (ACCUNEB) 0.63 mg/3 mL nebulizer solution 3 mL by Nebulization route every six (6) hours as needed for Wheezing.   ??? ipratropium-albuterol (COMBIVENT RESPIMAT) 20-100 mcg/actuation inhaler Take 1 Puff by inhalation every four (4) hours as needed for Wheezing.   ??? magnesium oxide (MAG-OX) 400 mg tablet Take 1 Tab by mouth daily.   ??? methocarbamol (ROBAXIN-750) 750 mg tablet Take 1 Tab by mouth four (4) times daily.   ??? ibuprofen (MOTRIN) 600 mg tablet Take 1 Tab by mouth every six (6) hours as needed for Pain.   ??? fluticasone (FLONASE) 50 mcg/actuation nasal spray 2 Sprays by Both Nostrils route daily.   ??? oxyCODONE-acetaminophen (PERCOCET 10) 10-325 mg per tablet Take 1 Tab by mouth every six (6) hours as needed for Pain.   ??? ibuprofen (MOTRIN) 600 mg tablet Take 1 Tab by mouth every eight (8) hours as needed for Pain.   ??? tiZANidine (ZANAFLEX) 2 mg tablet Take 1 Tab by mouth three (3) times daily.   ??? amLODIPine (NORVASC) 10 mg tablet TAKE ONE TABLET BY MOUTH ONCE DAILY   ??? metFORMIN ER (GLUCOPHAGE XR) 750 mg tablet TAKE 2 TABLETS EVERY DAY   ??? traZODone (DESYREL) 100 mg tablet Take 1 Tab by mouth nightly.    ??? losartan (COZAAR) 100 mg tablet Take 1 Tab by mouth daily. Indications: hypertension   ??? QUEtiapine SR (SEROQUEL XR) 150 mg sr tablet TAKE 1 TABLET BY MOUTH NIGHTLY   ??? gabapentin (NEURONTIN) 600 mg tablet TAKE 1 TABLET BY MOUTH THREE TIMES DAILY   ??? cloNIDine HCl (CATAPRES) 0.3 mg tablet TAKE ONE TABLET BY MOUTH TWICE DAILY FOR 30  DAYS   ??? spironolactone (ALDACTONE) 100 mg tablet Take 1 Tab by mouth daily.   ??? diclofenac EC (VOLTAREN) 75 mg EC tablet Take 1 Tab by mouth two (2) times daily as needed.   ??? Blood-Glucose Meter monitoring kit Check sugars 2 times per day, fasting and 2 hours after dinner   ??? Lancets misc Check sugars twice daily   ??? glucose blood VI test strips (ASCENSIA AUTODISC VI, ONE TOUCH ULTRA TEST VI) strip Check sugars twice daily   ??? albuterol (PROVENTIL HFA, VENTOLIN HFA, PROAIR HFA) 90 mcg/actuation inhaler Take 2 Puffs by inhalation every six (6) hours as needed for Wheezing or Shortness of Breath.     No current facility-administered medications for this visit.        Allergies  Allergen Reactions   ??? Chocolate [Cocoa] Sneezing       Past Surgical History:   Procedure Laterality Date   ??? HX ENDOSCOPY     ??? HX GYN  2010    hysterectomy   ??? HX HIP REPLACEMENT Right 01/29/2015   ??? HX HIP REPLACEMENT         Family History   Problem Relation Age of Onset   ??? Hypertension Mother    ??? Cancer Maternal Aunt    ??? Alcohol abuse Maternal Grandfather        Social History     Social History   ??? Marital status: MARRIED     Spouse name: N/A   ??? Number of children: N/A   ??? Years of education: N/A     Occupational History   ??? Not on file.     Social History Main Topics   ??? Smoking status: Never Smoker   ??? Smokeless tobacco: Never Used   ??? Alcohol use No   ??? Drug use: No   ??? Sexual activity: No     Other Topics Concern   ??? Not on file     Social History Narrative     Review of Systems   Constitutional: Negative for chills, fever and weight loss.    Cardiovascular: Negative for chest pain and palpitations.   Musculoskeletal: Positive for joint pain.         Visit Vitals   ??? BP (!) 129/96 (BP 1 Location: Left arm, BP Patient Position: Sitting)   ??? Pulse 81   ??? Temp 97.9 ??F (36.6 ??C) (Oral)   ??? Resp 22   ??? Ht 5' 7"  (1.702 m)   ??? Wt 194 lb (88 kg)   ??? SpO2 98%   ??? BMI 30.38 kg/m2       Physical Exam   Constitutional: She appears well-developed and well-nourished. She appears distressed (in pain).   HENT:   Head: Normocephalic and atraumatic.   Right Ear: External ear normal.   Left Ear: External ear normal.   Eyes: EOM are normal.   Cardiovascular: Normal rate, regular rhythm and normal heart sounds.    No murmur heard.  Pulmonary/Chest: Effort normal and breath sounds normal. No respiratory distress. She has no wheezes. She has no rales.   Neurological: She is alert.   Limping on right, using a cane to help ambulate   Skin: She is not diaphoretic.   Psychiatric: She has a normal mood and affect.   Nursing note and vitals reviewed.        Assesment:  1. Chronic right hip pain  PMP reviewed.  Pain contract signed.  Medication filled for   - oxyCODONE-acetaminophen (PERCOCET 10) 10-325 mg per tablet; Take 1 Tab by mouth every eight (8) hours as needed for Pain. Max Daily Amount: 3 Tabs.  Dispense: 90 Tab; Refill: 0  - ketorolac (TORADOL) 30 mg/mL (1 mL) injection; 1 mL by IntraMUSCular route once for 1 dose.  Dispense: 1 Vial; Refill: 0        I have discussed the diagnosis with the patient and the intended management  The patient has received an after-visit summary and questions were answered concerning future plans.  I have discussed medication usage, side effects and warnings with the patient as well.I have reviewed the plan of care with the patient, accepted their input and they are in agreement with the treatment goals.         Follow-up Disposition:  Return in about 6 weeks (around 03/08/2016) for hypertension and diabetes.      Lake Bells, MD                 .

## 2016-02-09 ENCOUNTER — Encounter

## 2016-02-09 MED ORDER — SPIRONOLACTONE 100 MG TAB
100 mg | ORAL_TABLET | Freq: Every day | ORAL | 0 refills | Status: DC
Start: 2016-02-09 — End: 2016-05-10

## 2016-02-10 ENCOUNTER — Encounter

## 2016-02-10 MED ORDER — LOSARTAN 100 MG TAB
100 mg | ORAL_TABLET | Freq: Every day | ORAL | 0 refills | Status: DC
Start: 2016-02-10 — End: 2016-08-05

## 2016-02-18 ENCOUNTER — Encounter

## 2016-02-18 MED ORDER — TRAZODONE 100 MG TAB
100 mg | ORAL_TABLET | Freq: Every evening | ORAL | 0 refills | Status: DC
Start: 2016-02-18 — End: 2016-05-16

## 2016-02-19 IMAGING — MR MR ABDOMEN WO/W CM MRCP
11 of 19 series · 27 of 48 positions shown · IV contrast (Default)
Comparison: CT abdomen/ pelvis 11/02/2013

CLINICAL DATA: Dilated common bile duct evaluate for stone or mass

EXAM:
MRI ABDOMEN WITHOUT AND WITH CONTRAST (INCLUDING MRCP)
TECHNIQUE: Multiplanar multisequence MR imaging of the abdomen was performed
both before and after the administration of intravenous contrast.
Heavily T2-weighted images of the biliary and pancreatic ducts were
obtained, and three-dimensional MRCP images were rendered by post
processing.
CONTRAST:  19 mL MultiHance

[Series 4: T2 fat-sat · axial · 8.0mm · 0.74mm/px · z∈[-45,+156]mm · 2 of 22 slices shown]
[im 1/22]
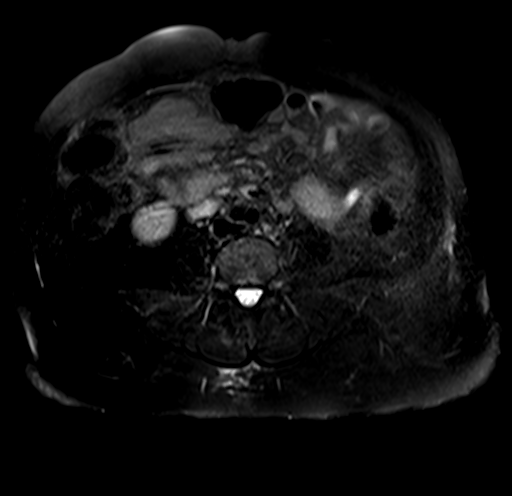
[im 22/22]
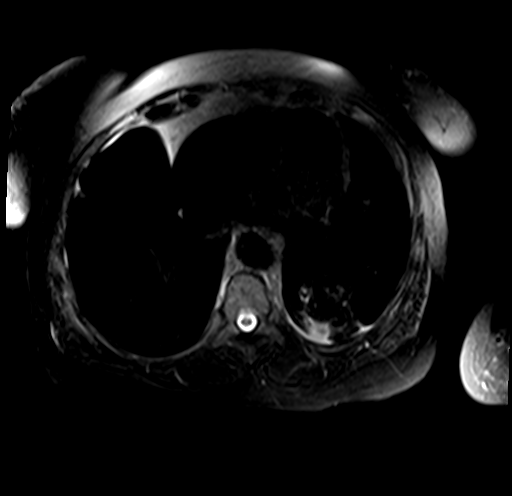

[Series 8: cor thins · coronal · 4.0mm · 0.89mm/px · 2 of 13 slices shown]
[im 1/13]
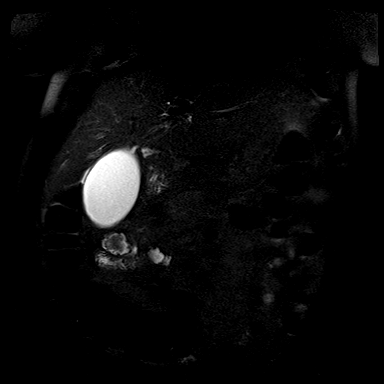
[im 13/13]
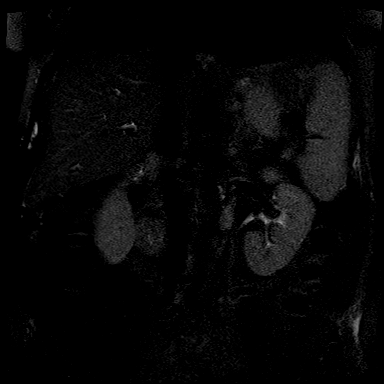

[Series 11: DWI · axial · 8.0mm · 1.98mm/px · z∈[-45,+156]mm · 4 of 66 slices shown]
[im 1/66]
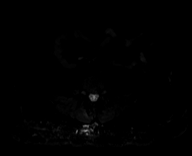
[im 22/66]
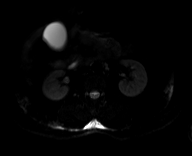
[im 44/66]
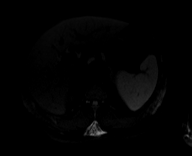
[im 66/66]
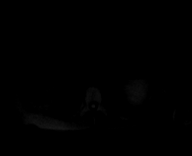

[Series 12: ax dwi_adc · axial · 8.0mm · 1.98mm/px · 1 of 22 slices shown]
[im 1/22]
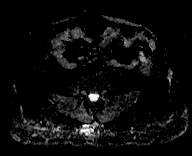

[Series 15: cor tru fisp · coronal · 4.0mm · 0.74mm/px · 2 of 54 slices shown]
[im 1/54]
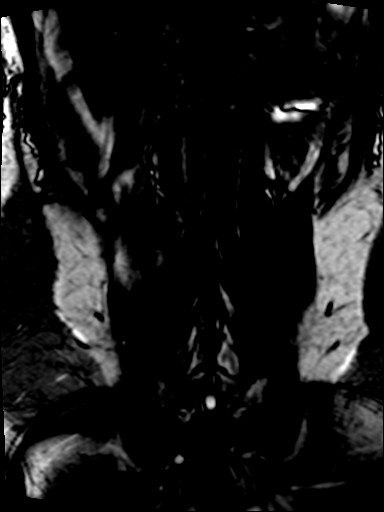
[im 54/54]
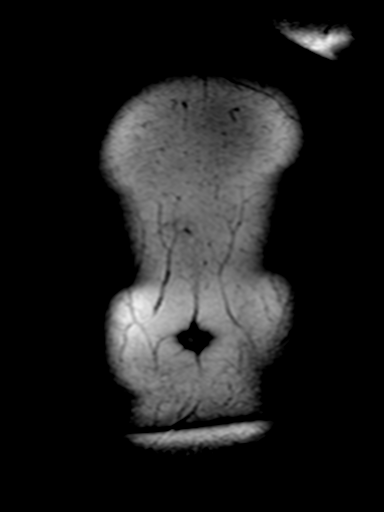

[Series 16: T2 · axial · 8.0mm · 0.74mm/px · 1 of 22 slices shown]
[im 1/22]
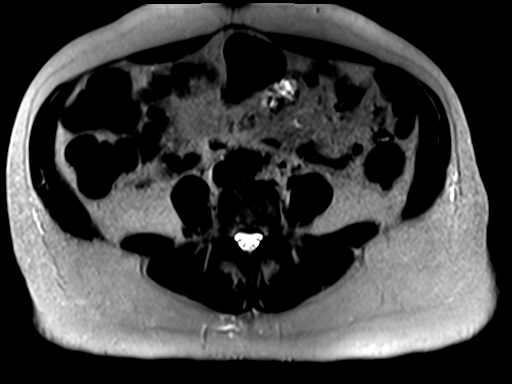

[Series 20: T1 dynamic fat-sat · axial · non-contrast · 2.5mm · 0.74mm/px · z∈[-43,+154]mm · 3 of 80 slices shown (1 of 2)]
[im 1/80]
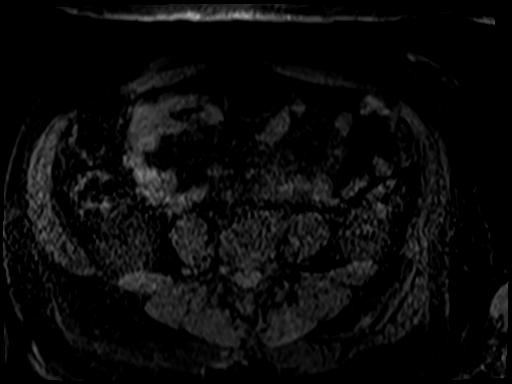
[im 40/80]
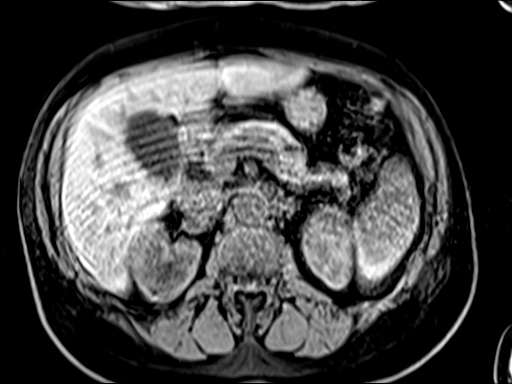
[im 80/80]
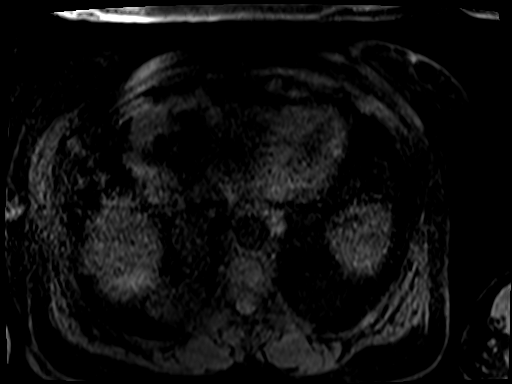

[Series 24: T1 dynamic fat-sat · axial · 2.5mm · 0.74mm/px · z∈[-43,+154]mm · 3 of 80 slices shown (2 of 2)]
[im 1/80]
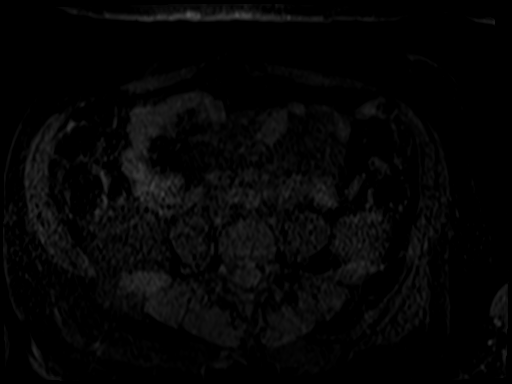
[im 40/80]
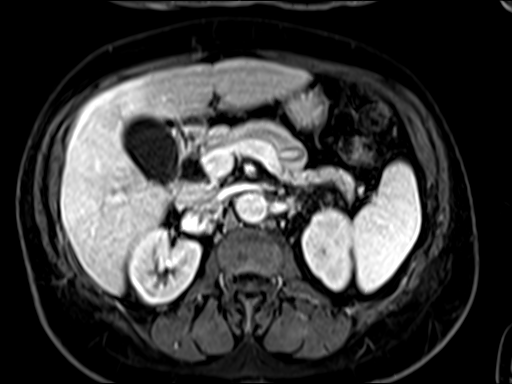
[im 80/80]
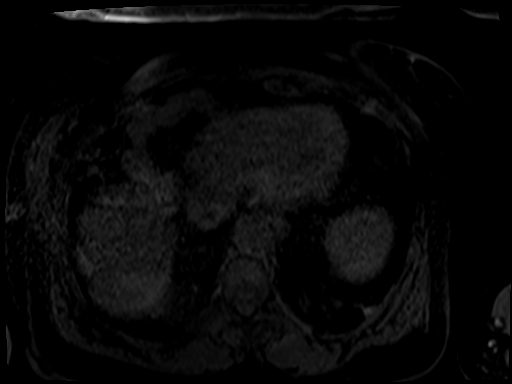

[Series 25: T1 dynamic fat-sat post-contrast · axial · 2.5mm · 0.74mm/px · z∈[-43,+154]mm · 3 of 80 slices shown (1 of 3)]
[im 1/80]
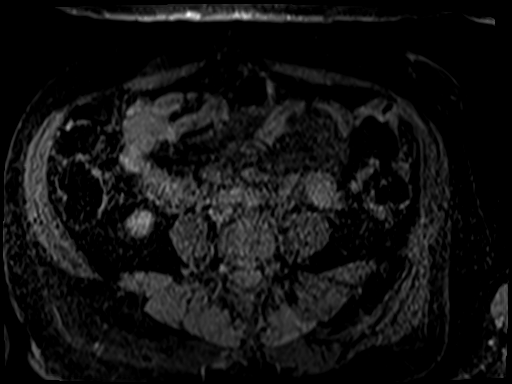
[im 40/80]
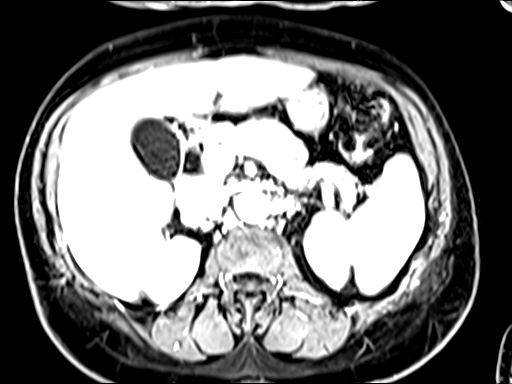
[im 80/80]
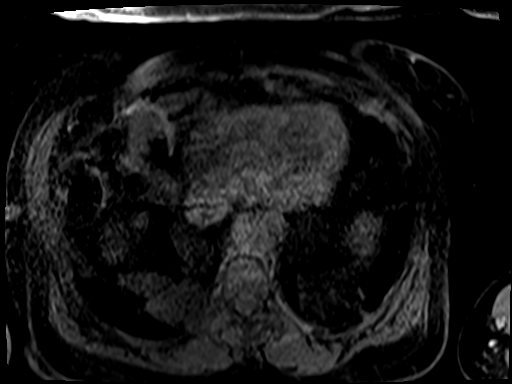

[Series 26: T1 dynamic fat-sat post-contrast · axial · 2.5mm · 0.74mm/px · z∈[-43,+154]mm · 3 of 80 slices shown (2 of 3)]
[im 1/80]
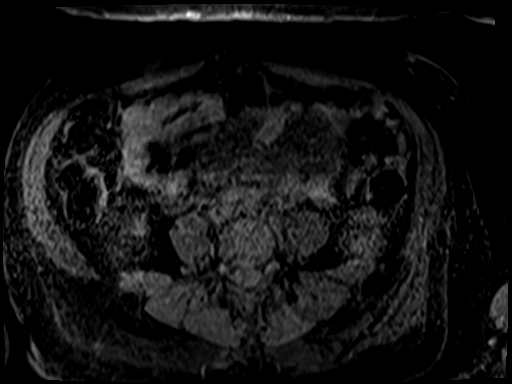
[im 40/80]
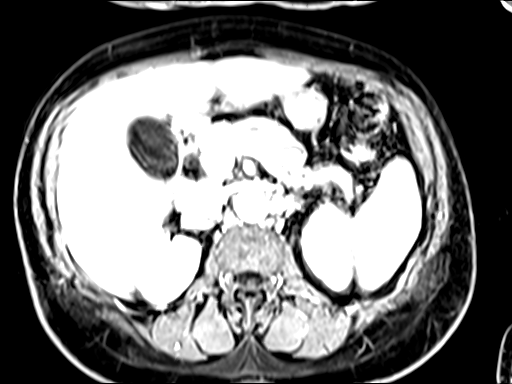
[im 80/80]
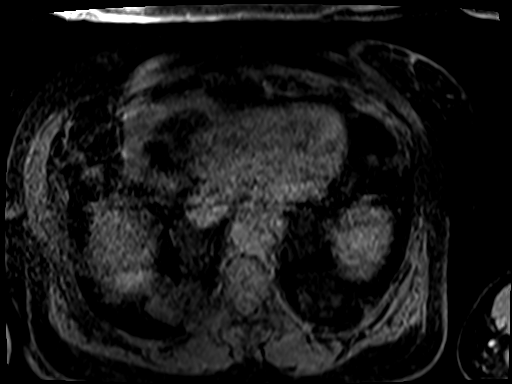

[Series 27: T1 dynamic fat-sat post-contrast · coronal · 2.5mm · 0.82mm/px · 3 of 88 slices shown (3 of 3)]
[im 1/88]
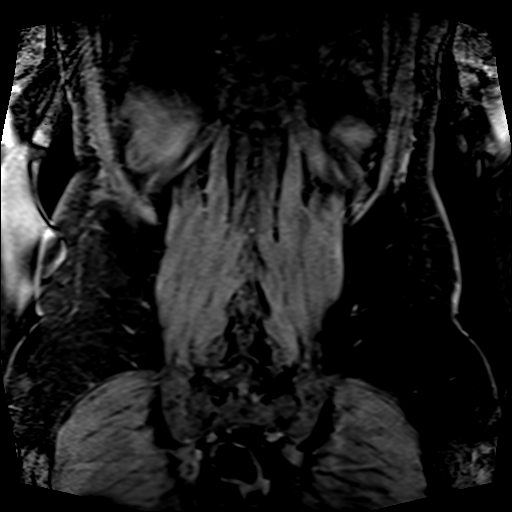
[im 30/88]
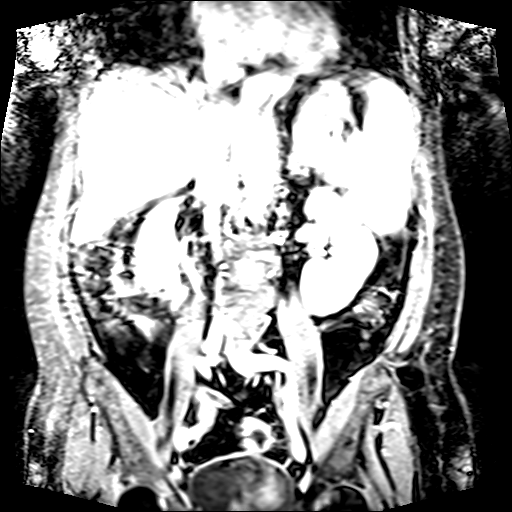
[im 59/88]
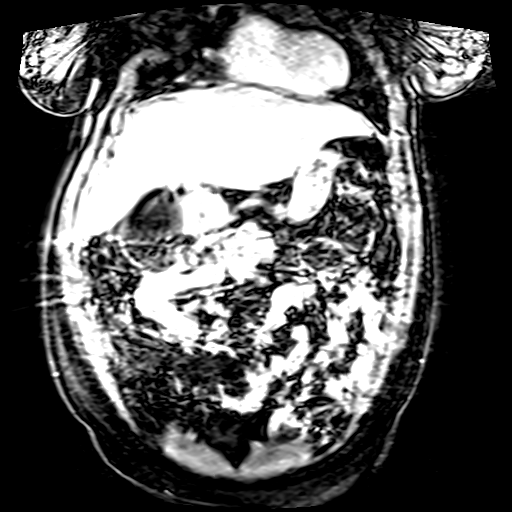

[27 of 48 positions shown; findings below may reference images not displayed]

FINDINGS: PROBLEM SPECIFIC FINDINGS

Predominantly extrahepatic biliary ductal dilatation. Common bile
duct measures 1.4 cm proximally and 1.1 cm distally. The
intrahepatic ducts are minimally dilated. Additionally, the main
pancreatic duct is a mildly and diffusely dilated measuring 3.5 mm
in the pancreatic body and up to 5.6 mm in the pancreatic head. Both
the common bile duct and distal main pancreatic duct terminate
abruptly in the region of the ampulla. The gallbladder itself is
also distended with a maximal transverse diameter of 4.3 cm. No
signal void within the gallbladder or bile duct to suggest
cholelithiasis or choledocholithiasis. No definite arterially
enhancing were delayed enhancing mass in the pancreatic head or
ampulla although this region of the anatomy is degraded by motion
artifact.

OTHER FINDINGS

Small benign T2 hyperintense foci within both kidneys consistent
with simple renal cysts. No evidence of hydronephrosis or
obstructing renal mass. No focal hepatic lesion. Hepatic and portal
veins are widely patent. The splenic vein and splenic parenchyma are
unremarkable. The spleen itself is slightly enlarged measuring
between 600 and 700 cubic cm. No pleural effusion. Normal caliber
aorta. No focal signal abnormality or abnormal enhancement in the
osseous or soft tissue structures.
IMPRESSION: 1. Predominantly extrahepatic biliary ductal dilatation as well as
dilatation of the main pancreatic duct with abrupt tapering in the
region of the ampulla. No evidence of choledocholithiasis or
obstructing mass. The most likely differential consideration is a
focal distal stenosis at the ampulla. A very small obstructing mass
which is below the soft tissue resolution of MRI is a less likely
possibility. Recommend ERCP.
2. Dilatation of the gallbladder without evidence of cholelithiasis.
3. Mild splenomegaly of uncertain etiology and clinical
significance. The liver does not appear cirrhotic.
4. Small simple renal cysts.

## 2016-02-20 IMAGING — US US EXTREM NON VASC*R* COMPLETE
1 series · 14 of 18 positions shown · non-contrast
Comparison: Pelvic CT 11/02/2013.

CLINICAL DATA: Right groin pain and nodularity for 2 weeks.

EXAM:
ULTRASOUND OF THE RIGHT LOWER EXTREMITY LIMITED
TECHNIQUE: Ultrasound examination of the lower extremity soft tissues was
performed in the area of clinical concern.

[Series 1: us extrem non vasc*right* complete · 0.06mm/px · 18 acquisitions, 14 frames shown]
[im 1/18]
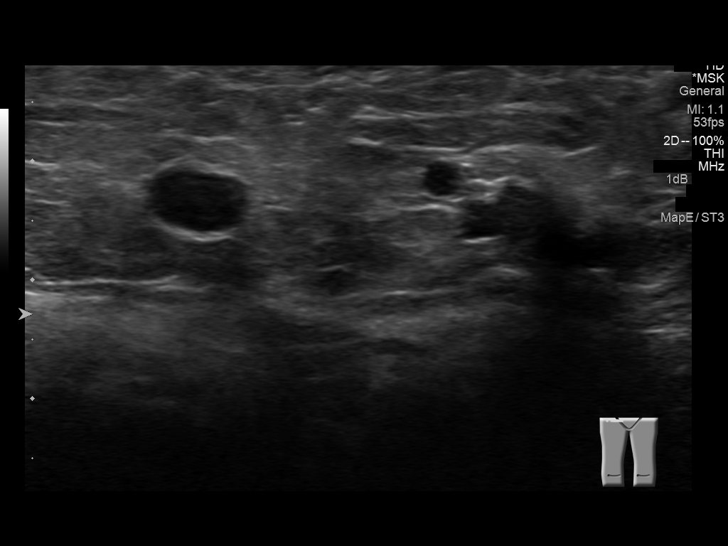
[im 2/18]
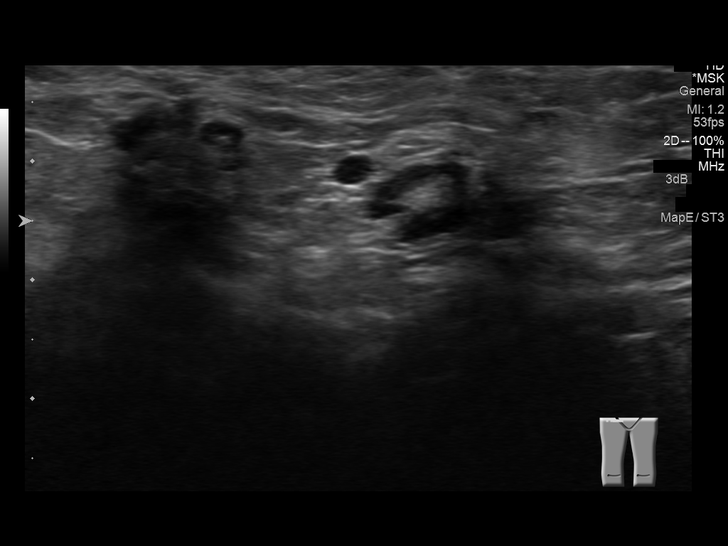
[im 4/18]
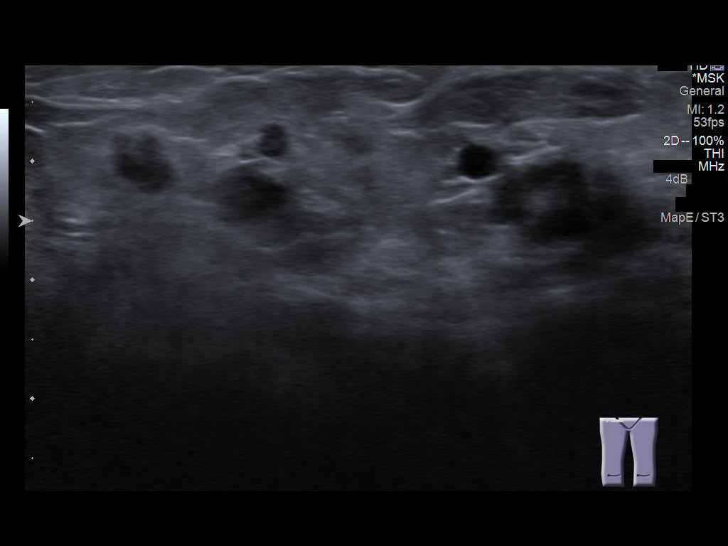
[im 5/18]
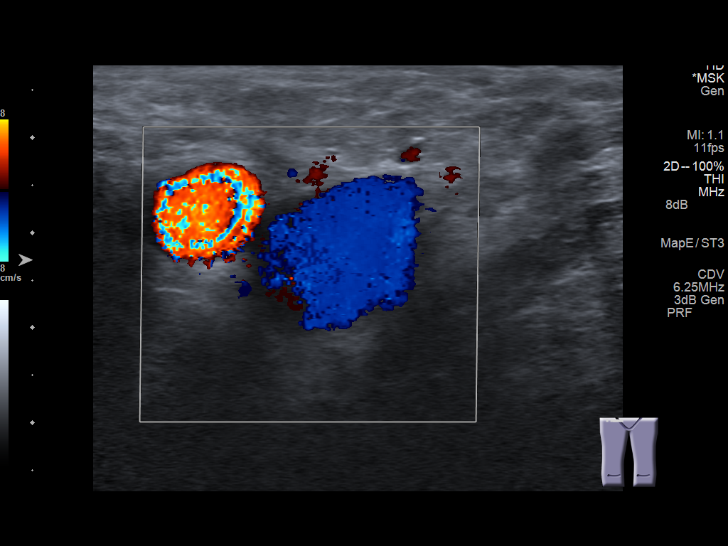
[im 6/18]
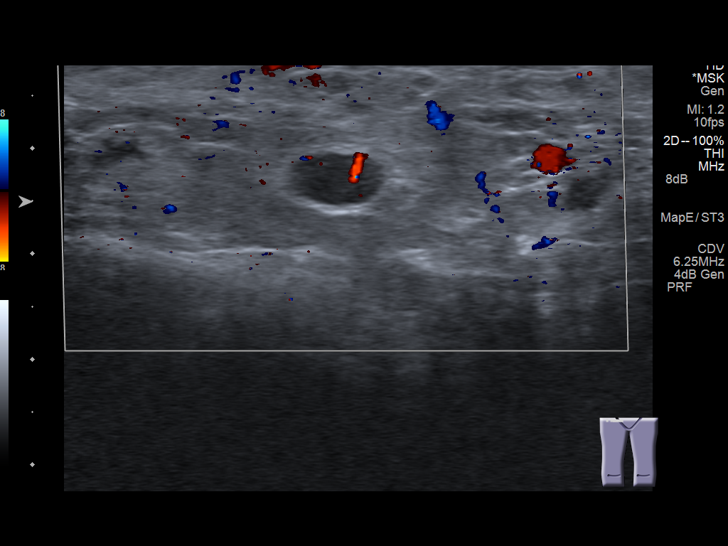
[im 8/18]
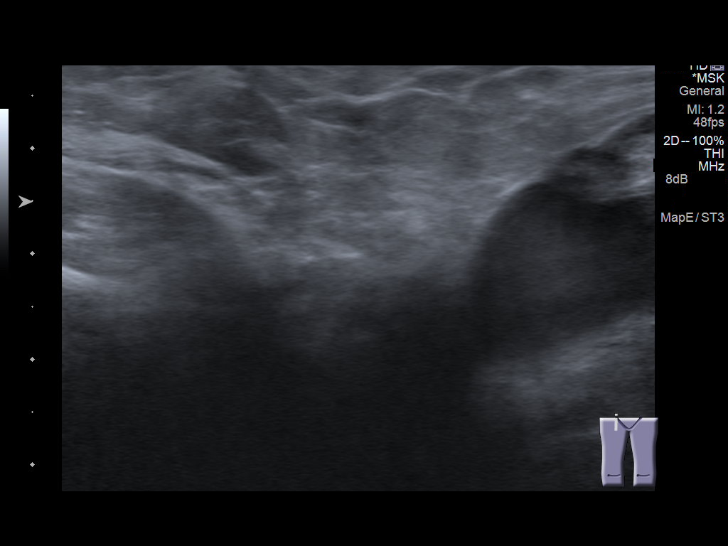
[im 9/18]
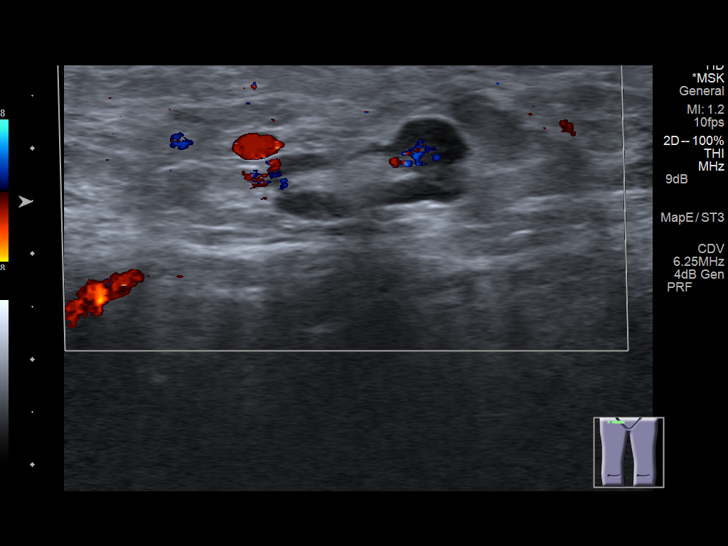
[im 10/18]
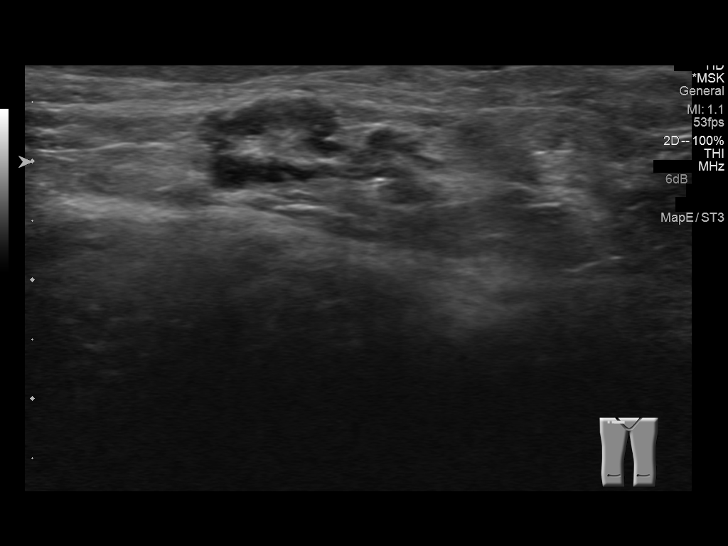
[im 11/18]
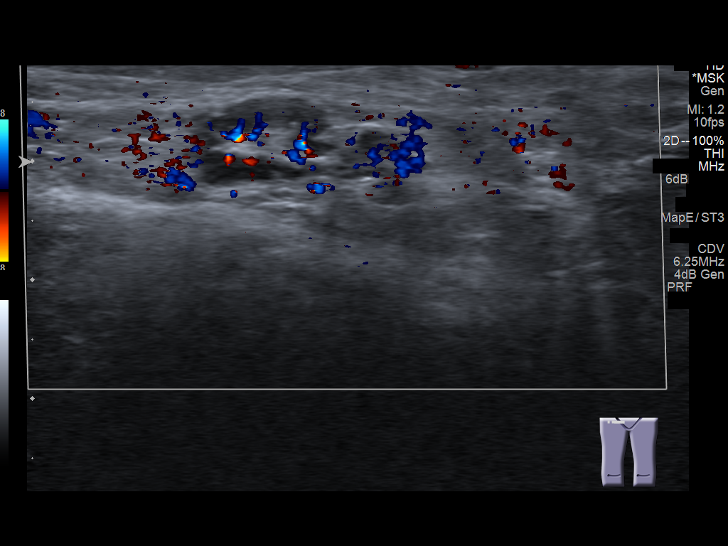
[im 13/18]
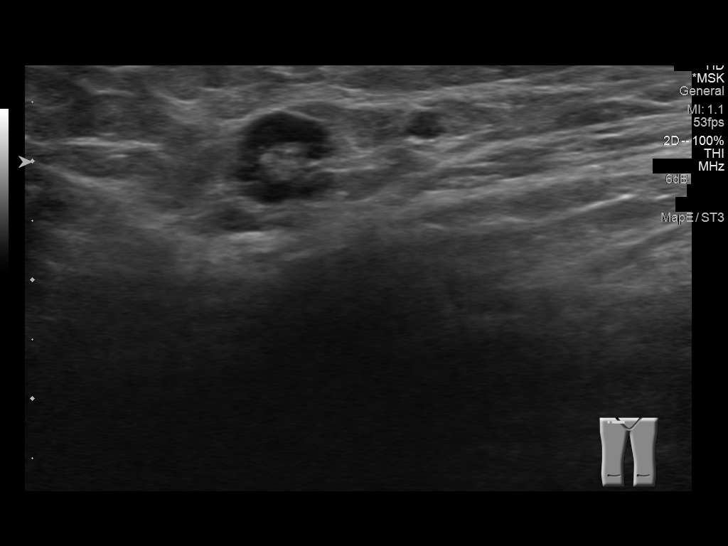
[im 14/18]
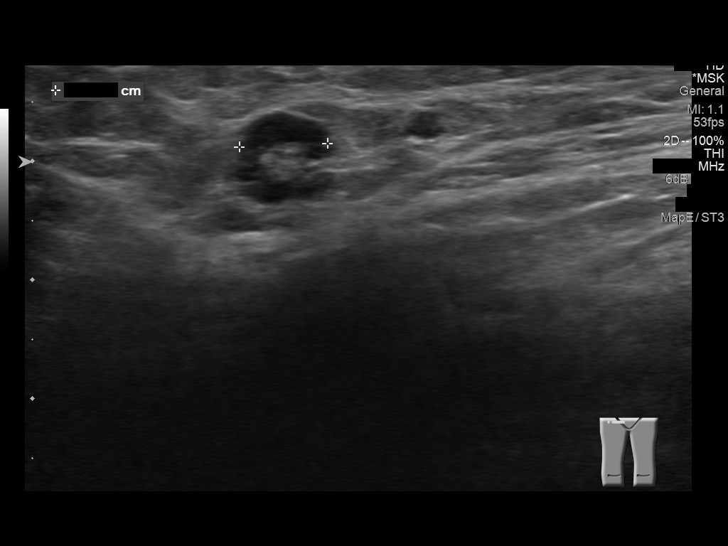
[im 15/18]
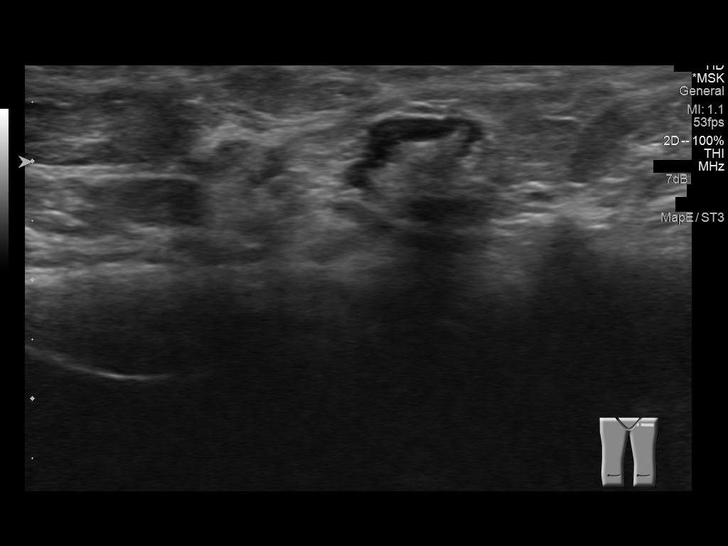
[im 17/18]
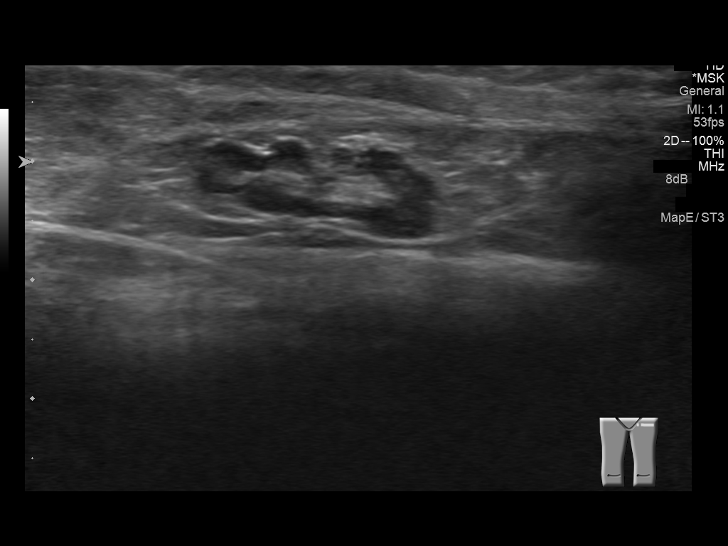
[im 18/18]
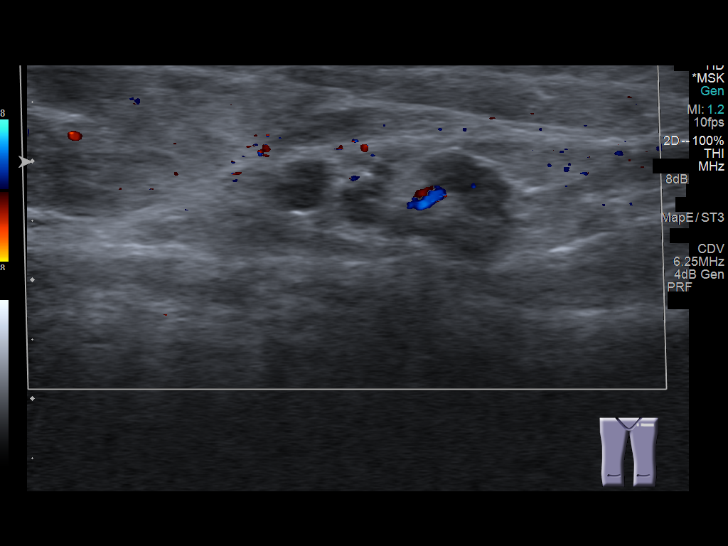

[14 of 18 positions shown; findings below may reference images not displayed]

FINDINGS: Examination was limited to the right groin. Several small lymph
nodes are noted, not pathologically enlarged. These have normal
fatty hila. There is no suspicious mass, fluid collection or hernia.
There is no gross vascular abnormality, although dedicated vascular
evaluation was not performed. Limited evaluation of the left groin
was performed for comparison purposes. There are similar sized lymph
nodes within the left groin.
IMPRESSION: No acute or suspicious findings. Small lymph nodes in the right
groin are not pathologically enlarged and similar to those seen on
the left.

## 2016-02-20 IMAGING — CR RIGHT HIP - COMPLETE 2+ VIEW
1 series · 3 of 3 positions shown · non-contrast
Comparison: CT, 11/02/2013

CLINICAL DATA: Nontraumatic right hip pain

EXAM:
RIGHT HIP - COMPLETE 2+ VIEW

[Series 1: t pelvis ap · 0.14mm/px · 3 of 3 slices shown]
[im 1/3]
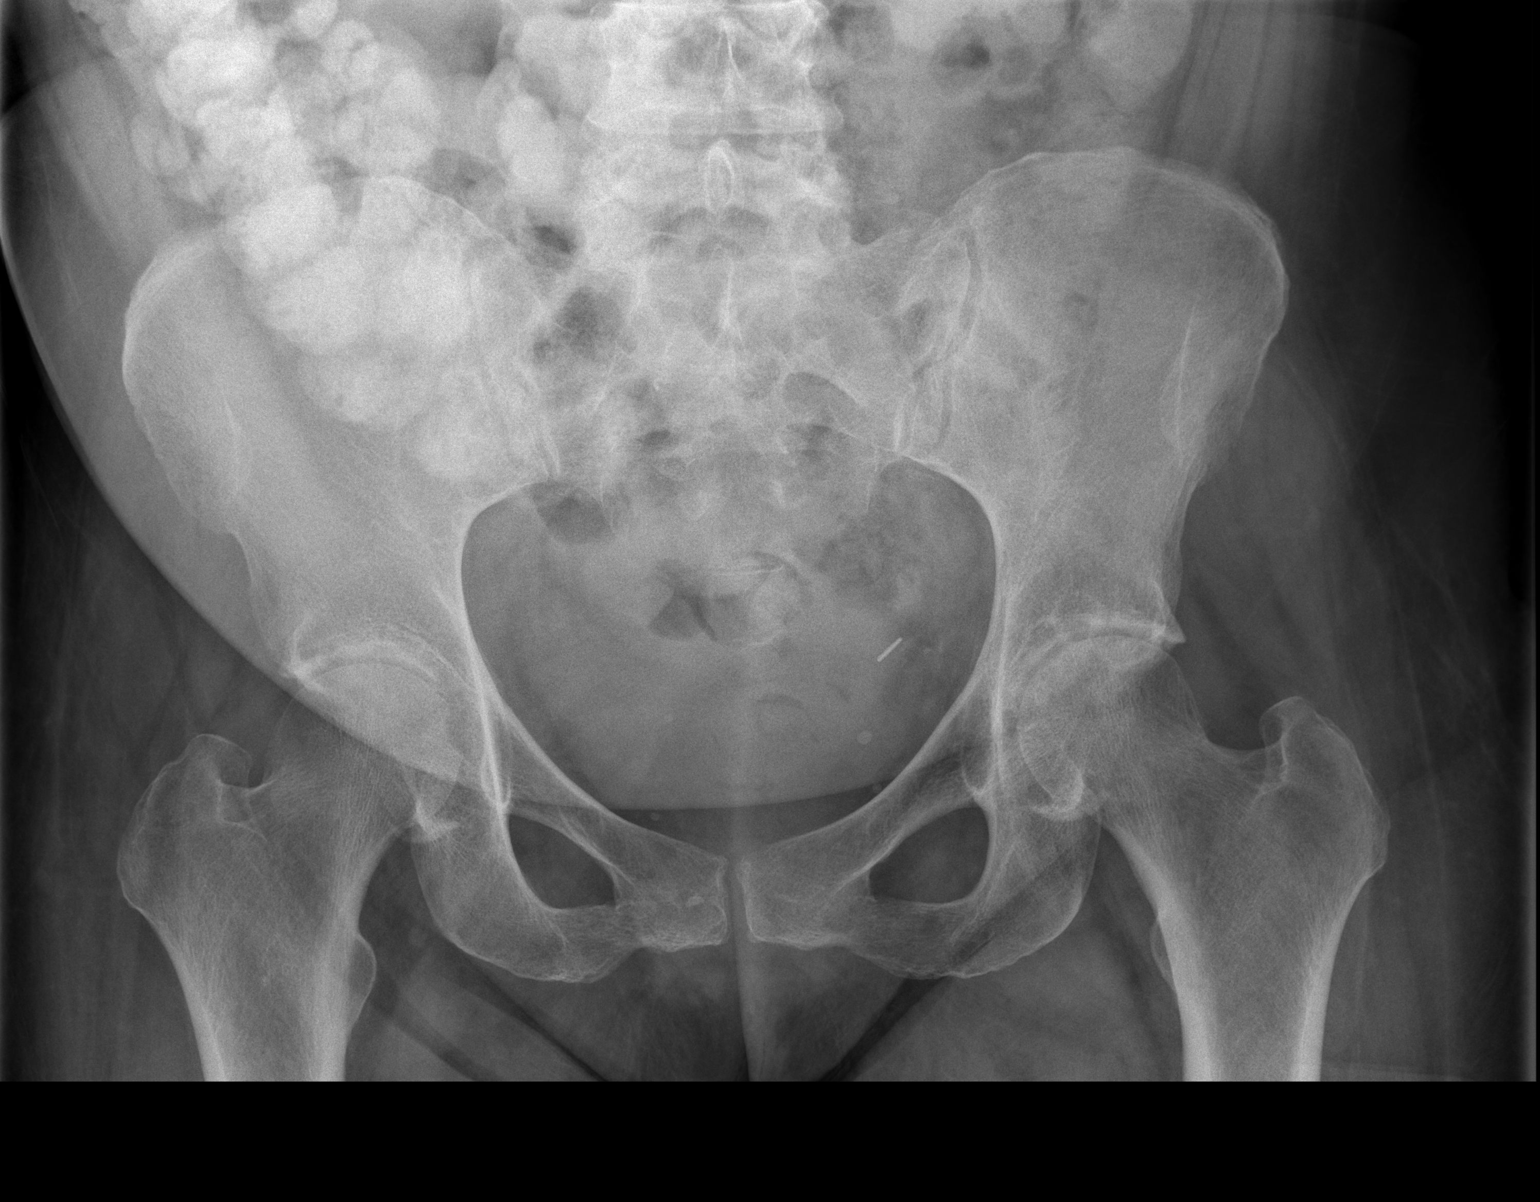
[im 2/3]
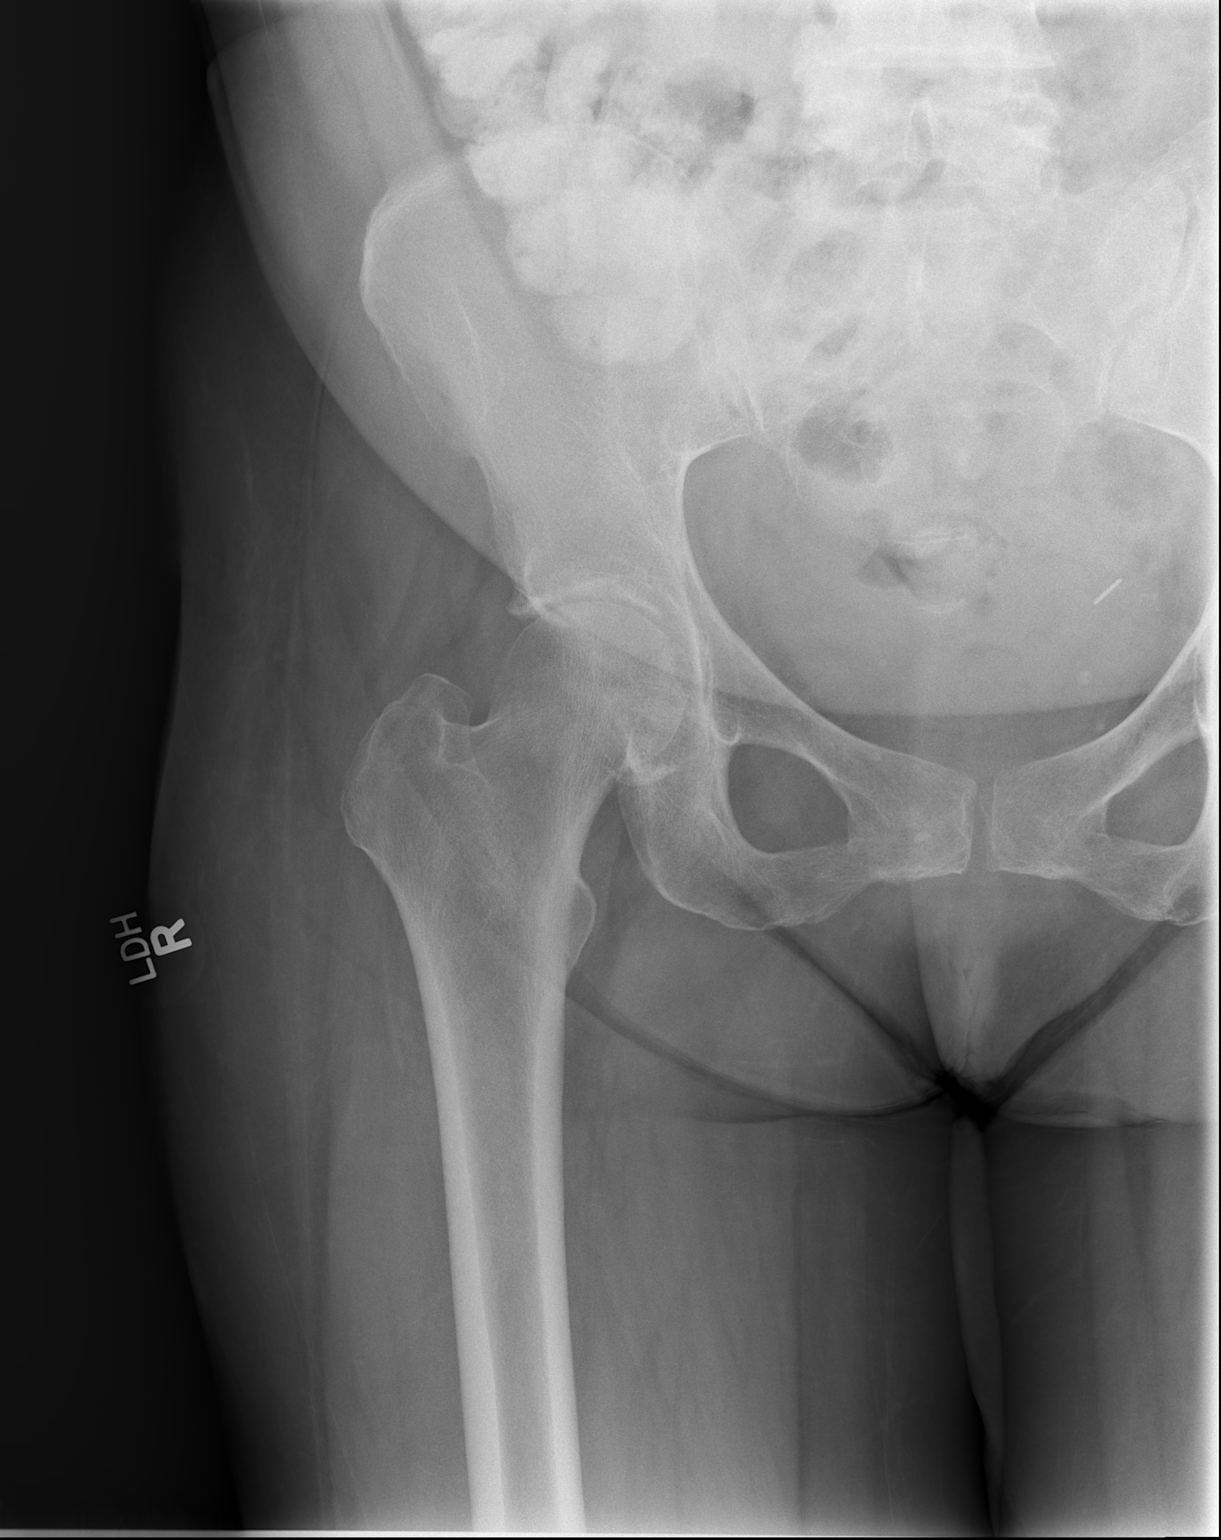
[im 3/3]
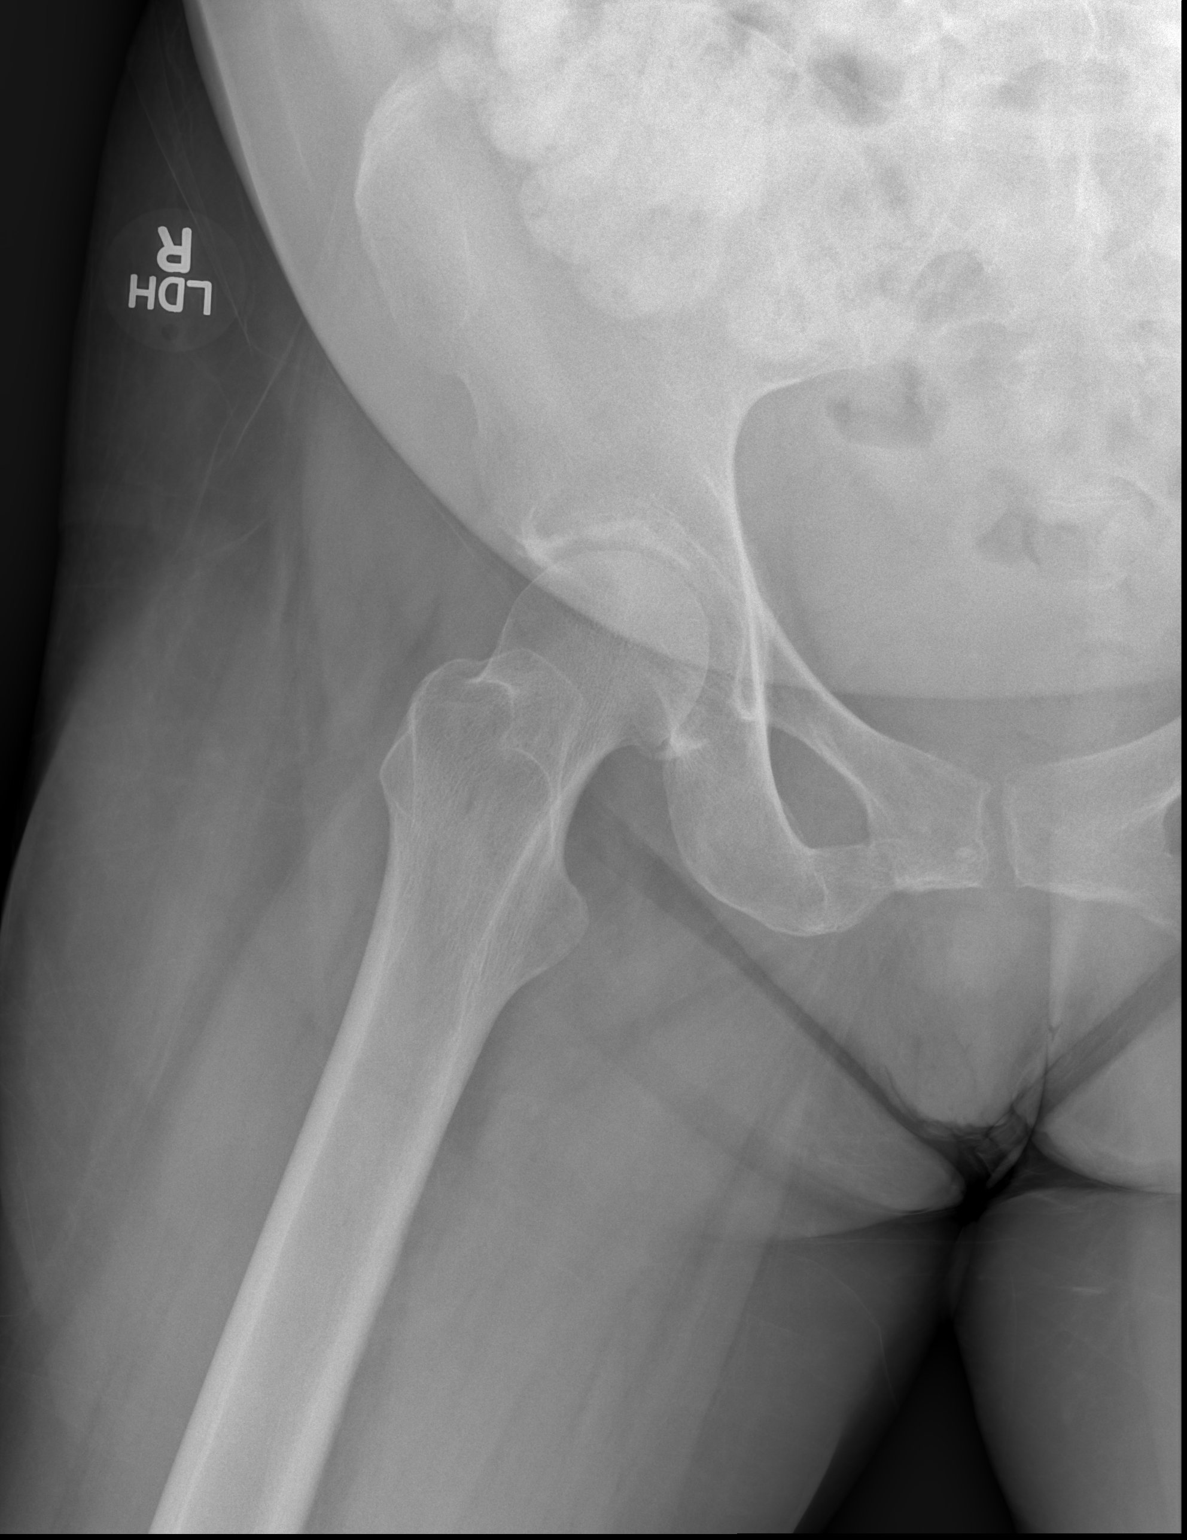

[3 of 3 positions shown; findings below may reference images not displayed]

FINDINGS: There arthropathic changes of both hips with axial hip joint space
narrowing and mild subchondral sclerosis and subtle cystic change
along the superior acetabula.

No fracture. No bone lesion. SI joints and symphysis pubis are
normally spaced and aligned.

Soft tissues are unremarkable.
IMPRESSION: Bilateral hip joint arthropathic changes. This is better visualized
on the previous day's CT. Possible etiologies include rheumatoid
arthritis or other inflammatory arthropathy.

No fracture or acute finding.

## 2016-02-25 ENCOUNTER — Encounter

## 2016-02-25 MED ORDER — QUETIAPINE SR 150 MG 24 HR TAB
150 mg | ORAL_TABLET | ORAL | 1 refills | Status: DC
Start: 2016-02-25 — End: 2016-08-01

## 2016-03-09 ENCOUNTER — Encounter: Attending: Family Medicine | Primary: Physician Assistant

## 2016-03-16 ENCOUNTER — Encounter

## 2016-03-21 ENCOUNTER — Inpatient Hospital Stay: Payer: MEDICARE | Attending: Specialist | Primary: Physician Assistant

## 2016-03-21 ENCOUNTER — Ambulatory Visit: Payer: MEDICARE | Primary: Physician Assistant

## 2016-03-22 ENCOUNTER — Inpatient Hospital Stay: Admit: 2016-03-22 | Payer: MEDICARE | Attending: Specialist | Primary: Physician Assistant

## 2016-03-22 ENCOUNTER — Encounter

## 2016-03-22 DIAGNOSIS — E041 Nontoxic single thyroid nodule: Secondary | ICD-10-CM

## 2016-03-22 DIAGNOSIS — R911 Solitary pulmonary nodule: Secondary | ICD-10-CM

## 2016-03-22 DIAGNOSIS — M545 Low back pain, unspecified: Secondary | ICD-10-CM

## 2016-05-08 ENCOUNTER — Encounter

## 2016-05-10 MED ORDER — SPIRONOLACTONE 100 MG TAB
100 mg | ORAL_TABLET | Freq: Every day | ORAL | 0 refills | Status: DC
Start: 2016-05-10 — End: 2016-08-05

## 2016-05-10 MED ORDER — CLONIDINE 0.3 MG TAB
0.3 mg | ORAL_TABLET | ORAL | 1 refills | Status: DC
Start: 2016-05-10 — End: 2016-05-10

## 2016-05-10 MED ORDER — MAGNESIUM OXIDE 400 MG TAB
400 mg | ORAL_TABLET | ORAL | 0 refills | Status: DC
Start: 2016-05-10 — End: 2016-09-22

## 2016-05-11 MED ORDER — CLONIDINE 0.3 MG TAB
0.3 mg | ORAL_TABLET | ORAL | 1 refills | Status: DC
Start: 2016-05-11 — End: 2017-01-09

## 2016-05-12 NOTE — Progress Notes (Signed)
Patient calling in regards of date of last mammogram.

## 2016-05-16 ENCOUNTER — Encounter

## 2016-05-16 ENCOUNTER — Encounter: Attending: Family Medicine | Primary: Physician Assistant

## 2016-05-16 MED ORDER — TRAZODONE 100 MG TAB
100 mg | ORAL_TABLET | Freq: Every evening | ORAL | 0 refills | Status: DC
Start: 2016-05-16 — End: 2016-08-12

## 2016-05-18 ENCOUNTER — Encounter: Attending: Family Medicine | Primary: Physician Assistant

## 2016-06-04 ENCOUNTER — Encounter

## 2016-06-07 MED ORDER — METFORMIN SR 750 MG 24 HR TABLET
750 mg | ORAL_TABLET | ORAL | 1 refills | Status: DC
Start: 2016-06-07 — End: 2016-10-19

## 2016-06-10 ENCOUNTER — Ambulatory Visit: Admit: 2016-06-10 | Discharge: 2016-06-10 | Payer: MEDICARE | Attending: Family Medicine | Primary: Physician Assistant

## 2016-06-10 ENCOUNTER — Inpatient Hospital Stay: Admit: 2016-06-10 | Payer: MEDICARE | Primary: Physician Assistant

## 2016-06-10 DIAGNOSIS — I10 Essential (primary) hypertension: Secondary | ICD-10-CM

## 2016-06-10 MED ORDER — KETOROLAC TROMETHAMINE 60 MG/2 ML IM
60 mg/2 mL | Freq: Once | INTRAMUSCULAR | 0 refills | Status: AC
Start: 2016-06-10 — End: 2016-06-10

## 2016-06-10 NOTE — Progress Notes (Signed)
Hip Pain (right)        HPI: [name] [name] is a 65 y.o. female [place]  Here in follow up of her HTN, DM and chronic right hip pain.    HTN- She is using her BP meds as prescribed. She denies any adverse effect. She does not smoke. She does not engage in regular exercise. She does not check her BP at home.    DM-  She is using her metformin as prescribed. She does not check her BS at home.  She does not exercise regularly    Chronic right hip pain- S/p right hip replacement. Sees Dr Leron Croak for pain management. She is currently using percocet for pain control. Pain is worsened by movement. Currently 8/10 despite use of her prescribed percocet.         Past Medical History:   Diagnosis Date   ??? Arthritis    ??? Chronic pain    ??? Diabetes ([place])    ??? GERD (gastroesophageal reflux disease)    ??? Hypertension    ??? Liver disease     HEP C   ??? PTSD (post-traumatic stress disorder)     lived through the [place] in 1993       Current Outpatient Prescriptions   Medication Sig   ??? metFORMIN ER (GLUCOPHAGE XR) 750 mg tablet TAKE 2 TABLETS EVERY DAY   ??? traZODone (DESYREL) 100 mg tablet Take 1 Tab by mouth nightly.   ??? cloNIDine HCl (CATAPRES) 0.3 mg tablet TAKE ONE TABLET BY MOUTH TWICE DAILY   ??? magnesium oxide (MAG-OX) 400 mg tablet TAKE 1 TABLET BY MOUTH DAILY   ??? QUEtiapine SR (SEROQUEL XR) 150 mg sr tablet TAKE 1 TABLET BY MOUTH NIGHTLY   ??? losartan (COZAAR) 100 mg tablet Take 1 Tab by mouth daily. Indications: hypertension   ??? amLODIPine (NORVASC) 10 mg tablet TAKE ONE TABLET BY MOUTH ONCE DAILY   ??? gabapentin (NEURONTIN) 600 mg tablet TAKE 1 TABLET BY MOUTH THREE TIMES DAILY   ??? diclofenac EC (VOLTAREN) 75 mg EC tablet Take 1 Tab by mouth two (2) times daily as needed.   ??? spironolactone (ALDACTONE) 100 mg tablet Take 1 Tab by mouth daily.   ??? oxyCODONE-acetaminophen (PERCOCET 10) 10-325 mg per tablet Take 1 Tab by  mouth every eight (8) hours as needed for Pain. Max Daily Amount: 3 Tabs.   ??? albuterol (ACCUNEB) 0.63 mg/3 mL nebulizer solution 3 mL by Nebulization route every six (6) hours as needed for Wheezing.   ??? ipratropium-albuterol (COMBIVENT RESPIMAT) 20-100 mcg/actuation inhaler Take 1 Puff by inhalation every four (4) hours as needed for Wheezing.   ??? methocarbamol (ROBAXIN-750) 750 mg tablet Take 1 Tab by mouth four (4) times daily.   ??? fluticasone (FLONASE) 50 mcg/actuation nasal spray 2 Sprays by Both Nostrils route daily.   ??? ibuprofen (MOTRIN) 600 mg tablet Take 1 Tab by mouth every eight (8) hours as needed for Pain.   ??? tiZANidine (ZANAFLEX) 2 mg tablet Take 1 Tab by mouth three (3) times daily.   ??? Blood-Glucose Meter monitoring kit Check sugars 2 times per day, fasting and 2 hours after dinner   ??? Lancets misc Check sugars twice daily   ??? glucose blood VI test strips (ASCENSIA AUTODISC VI, ONE TOUCH ULTRA TEST VI) strip Check sugars twice daily   ??? albuterol (PROVENTIL HFA, VENTOLIN HFA, PROAIR HFA) 90 mcg/actuation inhaler Take 2 Puffs by inhalation every six (6)  hours as needed for Wheezing or Shortness of Breath.     No current facility-administered medications for this visit.        Allergies   Allergen Reactions   ??? Chocolate [Cocoa] Sneezing       Past Surgical History:   Procedure Laterality Date   ??? HX ENDOSCOPY     ??? HX GYN  2010    hysterectomy   ??? HX HIP REPLACEMENT Right 01/29/2015   ??? HX HIP REPLACEMENT         Family History   Problem Relation Age of Onset   ??? Hypertension Mother    ??? Cancer Maternal Aunt    ??? Alcohol abuse Maternal Grandfather        Social History     Social History   ??? Marital status: MARRIED     Spouse name: N/A   ??? Number of children: N/A   ??? Years of education: N/A     Occupational History   ??? Not on file.     Social History Main Topics   ??? Smoking status: Never Smoker   ??? Smokeless tobacco: Never Used   ??? Alcohol use No   ??? Drug use: No   ??? Sexual activity: No      Other Topics Concern   ??? Not on file     Social History Narrative       Gen- No weight loss, Eyes- No dipoplia, blurry vision  CVS- No Chest pain, no palpitations, no edema  Resp- No Cough, SOB, DOE, Wheezing  Neuro- No headaches  Skin- No easy bruising, No rashes      Visit Vitals   ??? BP (!) 111/91 (BP 1 Location: Right arm, BP Patient Position: Sitting)   ??? Pulse 98   ??? Temp 98.8 ??F (37.1 ??C) (Oral)   ??? Resp 20   ??? Ht 5' 7" (1.702 m)   ??? SpO2 96%       GEN- NAD, AAOx3, sitting in wheelchair  CVS- RRR, +S1, +S2, No Murmurs, No JVD  PULM- CTABL, No W/R/R  EXT- No edema  PSYCH- Euthymic, normal afffect    Diabetic foot exam:     Left: Reflexes 2+     Vibratory sensation not examined    Proprioception normal   Sharp/dull discrimination normal    Filament test normal sensation with micro filament   Pulse DP: 2+ (normal)   Pulse PT: 2+ (normal)   Deformities: None  Right: Reflexes 2+   Vibratory sensation not examined   Proprioception normal   Sharp/dull discrimination normal   Filament test normal sensation with micro filament   Pulse DP: 2+ (normal)   Pulse PT: 2+ (normal)   Deformities: None          Assesment:  1. Essential hypertension  Reasonably controlled. Continue current medications. Needs updated labs.  - METABOLIC PANEL, COMPREHENSIVE; Future  - LIPID PANEL; Future    2. Controlled type 2 diabetes mellitus without complication, without long-term current use of insulin ([place])  Adivsed to check blood sugars at home. Needs updated L9J today  - METABOLIC PANEL, COMPREHENSIVE; Future  - LIPID PANEL; Future  - HEMOGLOBIN A1C WITH EAG; Future    3. Chronic right hip pain  Continue management with Dr Leron Croak. Toradol shot given today  - ketorolac tromethamine (TORADOL) 60 mg/2 mL soln; 2 mL by IntraMUSCular route once for 1 dose.  Dispense: 1 Vial; Refill: 0  - KETOROLAC TROMETHAMINE INJ  - PR  THER/PROPH/DIAG INJECTION, SUBCUT/IM      I have discussed the diagnosis with the patient and the intended  management  The patient has received an after-visit summary and questions were answered concerning future plans.  I have discussed medication usage, side effects and warnings with the patient as well.I have reviewed the plan of care with the patient, accepted their input and they are in agreement with the treatment goals.         Follow-up Disposition:  Return in about 3 months (around 09/08/2016) for diabetes and hypertension pending labs.      L[place] Bells, MD                .

## 2016-06-10 NOTE — Progress Notes (Signed)
Patient aware and is agreeable to starting Crestor.

## 2016-06-10 NOTE — Patient Instructions (Signed)
Learning About Meal Planning for Diabetes  Why plan your meals?  Meal planning can be a key part of managing diabetes. Planning meals and snacks with the right balance of carbohydrate, protein, and fat can help you keep your blood sugar at the target level you set with your doctor.  You don't have to eat special foods. You can eat what your family eats, including sweets once in a while. But you do have to pay attention to how often you eat and how much you eat of certain foods.  You may want to work with a dietitian or a certified diabetes educator. He or she can give you tips and meal ideas and can answer your questions about meal planning. This health professional can also help you reach a healthy weight if that is one of your goals.  What plan is right for you?  Your dietitian or diabetes educator may suggest that you start with the plate format or carbohydrate counting.  The plate format  The plate format is a simple way to help you manage how you eat. You plan meals by learning how much space each food should take on a plate. Using the plate format helps you spread carbohydrate throughout the day. It can make it easier to keep your blood sugar level within your target range. It also helps you see if you're eating healthy portion sizes.  To use the plate format, you put non-starchy vegetables on half your plate. Add meat or meat substitutes on one-quarter of the plate. Put a grain or starchy vegetable (such as brown rice or a potato) on the final quarter of the plate. You can add a small piece of fruit and some low-fat or fat-free milk or yogurt, depending on your carbohydrate goal for each meal.  Here are some tips for using the plate format:  ?? Make sure that you are not using an oversized plate. A 9-inch plate is best. Many restaurants use larger plates.  ?? Get used to using the plate format at home. Then you can use it when you eat out.   ?? Write down your questions about using the plate format. Talk to your doctor, a dietitian, or a diabetes educator about your concerns.  Carbohydrate counting  With carbohydrate counting, you plan meals based on the amount of carbohydrate in each food. Carbohydrate raises blood sugar higher and more quickly than any other nutrient. It is found in desserts, breads and cereals, and fruit. It's also found in starchy vegetables such as potatoes and corn, grains such as rice and pasta, and milk and yogurt. Spreading carbohydrate throughout the day helps keep your blood sugar levels within your target range.  Your daily amount depends on several things, including your weight, how active you are, which diabetes medicines you take, and what your goals are for your blood sugar levels. A registered dietitian or diabetes educator can help you plan how much carbohydrate to include in each meal and snack.  A guideline for your daily amount of carbohydrate is:  ?? 45 to 60 grams at each meal. That's about the same as 3 to 4 carbohydrate servings.  ?? 15 to 20 grams at each snack. That's about the same as 1 carbohydrate serving.  The Nutrition Facts label on packaged foods tells you how much carbohydrate is in a serving of the food. First, look at the serving size on the food label. Is that the amount you eat in a serving? All of the nutrition information   on a food label is based on that serving size. So if you eat more or less than that, you'll need to adjust the other numbers. Total carbohydrate is the next thing you need to look for on the label. If you count carbohydrate servings, one serving of carbohydrate is 15 grams.  For foods that don't come with labels, such as fresh fruits and vegetables, you'll need a guide that lists carbohydrate in these foods. Ask your doctor, dietitian, or diabetes educator about books or other nutrition guides you can use.  If you take insulin, you need to know how many grams of carbohydrate are  in a meal. This lets you know how much rapid-acting insulin to take before you eat. If you use an insulin pump, you get a constant rate of insulin during the day. So the pump must be programmed at meals to give you extra insulin to cover the rise in blood sugar after meals.  When you know how much carbohydrate you will eat, you can take the right amount of insulin. Or, if you always use the same amount of insulin, you need to make sure that you eat the same amount of carbohydrate at meals.  If you need more help to understand carbohydrate counting and food labels, ask your doctor, dietitian, or diabetes educator.  How do you get started with meal planning?  Here are some tips to get started:  ?? Plan your meals a week at a time. Don't forget to include snacks too.  ?? Use cookbooks or online recipes to plan several main meals. Plan some quick meals for busy nights. You also can double some recipes that freeze well. Then you can save half for other busy nights when you don't have time to cook.  ?? Make sure you have the ingredients you need for your recipes. If you're running low on basic items, put these items on your shopping list too.  ?? List foods that you use to make breakfasts, lunches, and snacks. List plenty of fruits and vegetables.  ?? Post this list on the refrigerator. Add to it as you think of more things you need.  ?? Take the list to the store to do your weekly shopping.  Follow-up care is a key part of your treatment and safety. Be sure to make and go to all appointments, and call your doctor if you are having problems. It's also a good idea to know your test results and keep a list of the medicines you take.  Where can you learn more?  Go to http://www.healthwise.net/[employer].  Enter X936 in the search box to learn more about "Learning About Meal Planning for Diabetes."  Current as of: August 24, 2015  Content Version: 11.4  ?? 2006-2017 [employer]. Care instructions adapted under  license by [employer] (which disclaims liability or warranty for this information). If you have questions about a medical condition or this instruction, always ask your healthcare professional. Healthwise, Inc[employer] disclaims any warranty or liability for your use of this information.

## 2016-06-10 NOTE — Progress Notes (Signed)
[name] is a 65 y.o. female in today with c/o right hip pain 8/10.    Learning assessment previously completed 09/15/2015; primary language is [place].    1. Have you been to the ER, urgent care clinic since your last visit?  Hospitalized since your last visit?No    2. Have you seen or consulted any other health care providers outside of the [facility] since your last visit?  Include any pap smears or colon screening. No

## 2016-06-11 LAB — METABOLIC PANEL, COMPREHENSIVE
A-G Ratio: 0.8 (ref 0.8–1.7)
ALT (SGPT): 15 U/L (ref 13–56)
AST (SGOT): 13 U/L — ABNORMAL LOW (ref 15–37)
Albumin: 4 g/dL (ref 3.4–5.0)
Alk. phosphatase: 79 U/L (ref 45–117)
Anion gap: 9 mmol/L (ref 3.0–18)
BUN/Creatinine ratio: 24 — ABNORMAL HIGH (ref 12–20)
BUN: 26 MG/DL — ABNORMAL HIGH (ref 7.0–18)
Bilirubin, total: 0.6 MG/DL (ref 0.2–1.0)
CO2: 24 mmol/L (ref 21–32)
Calcium: 9.3 MG/DL (ref 8.5–10.1)
Chloride: 102 mmol/L (ref 100–108)
Creatinine: 1.1 MG/DL (ref 0.6–1.3)
GFR est AA: >60 mL/min/{1.73_m2} (ref >60)
GFR est non-AA: 50 mL/min/{1.73_m2} — ABNORMAL LOW (ref >60)
Globulin: 4.9 g/dL — ABNORMAL HIGH (ref 2.0–4.0)
Glucose: 118 mg/dL — ABNORMAL HIGH (ref 74–99)
Potassium: 4.7 mmol/L (ref 3.5–5.5)
Protein, total: 8.9 g/dL — ABNORMAL HIGH (ref 6.4–8.2)
Sodium: 135 mmol/L — ABNORMAL LOW (ref 136–145)

## 2016-06-11 LAB — LIPID PANEL
CHOL/HDL Ratio: 3.8 (ref 0–5.0)
Cholesterol, total: 153 MG/DL (ref <200)
HDL Cholesterol: 40 MG/DL (ref 40–60)
LDL, calculated: 81.2 MG/DL (ref 0–100)
Triglyceride: 159 MG/DL — ABNORMAL HIGH (ref <150)
VLDL, calculated: 31.8 MG/DL

## 2016-06-11 LAB — HEMOGLOBIN A1C WITH EAG
Est. average glucose: 148 mg/dL
Hemoglobin A1c: 6.8 % — ABNORMAL HIGH (ref 4.2–5.6)

## 2016-06-16 ENCOUNTER — Encounter

## 2016-06-17 MED ORDER — ROSUVASTATIN 5 MG TAB
5 mg | ORAL_TABLET | Freq: Every evening | ORAL | 1 refills | Status: DC
Start: 2016-06-17 — End: 2016-12-11

## 2016-06-30 ENCOUNTER — Encounter

## 2016-07-01 MED ORDER — AMLODIPINE 10 MG TAB
10 mg | ORAL_TABLET | ORAL | 1 refills | Status: DC
Start: 2016-07-01 — End: 2017-07-13

## 2016-07-21 ENCOUNTER — Emergency Department: Admit: 2016-07-21 | Payer: MEDICARE | Primary: Physician Assistant

## 2016-07-21 ENCOUNTER — Inpatient Hospital Stay
Admit: 2016-07-21 | Discharge: 2016-07-22 | Disposition: A | Discharge status: Home / Self Care | Payer: MEDICARE | Attending: Emergency Medicine

## 2016-07-21 DIAGNOSIS — I959 Hypotension, unspecified: Secondary | ICD-10-CM

## 2016-07-21 LAB — POC URINE MACROSCOPIC
Bilirubin: NEGATIVE
Glucose: NEGATIVE mg/dl
Nitrites: NEGATIVE
Protein: NEGATIVE mg/dl
Specific gravity: 1.015 (ref 1.005–1.030)
Urobilinogen: 1 EU/dl (ref 0.0–1.0)
pH (UA): 6.5 (ref 5–9)

## 2016-07-21 LAB — CBC WITH AUTOMATED DIFF
BASOPHILS: 0.3 % (ref 0–3)
EOSINOPHILS: 0.7 % (ref 0–5)
HCT: 39.7 % (ref 37.0–50.0)
HGB: 13.3 gm/dl (ref 13.0–17.2)
IMMATURE GRANULOCYTES: 0.3 % (ref 0.0–3.0)
LYMPHOCYTES: 27.7 % — ABNORMAL LOW (ref 28–48)
MCH: 29 pg (ref 25.4–34.6)
MCHC: 33.5 gm/dl (ref 30.0–36.0)
MCV: 86.7 fL (ref 80.0–98.0)
MONOCYTES: 5.4 % (ref 1–13)
MPV: 11.4 fL — ABNORMAL HIGH (ref 6.0–10.0)
NEUTROPHILS: 65.6 % — ABNORMAL HIGH (ref 34–64)
NRBC: 0 (ref 0–0)
PLATELET: 254 10*3/uL (ref 140–450)
RBC: 4.58 M/uL (ref 3.60–5.20)
RDW-SD: 40.9 (ref 36.4–46.3)
WBC: 11.6 10*3/uL — ABNORMAL HIGH (ref 4.0–11.0)

## 2016-07-21 LAB — POC URINE MICROSCOPIC

## 2016-07-21 LAB — METABOLIC PANEL, COMPREHENSIVE
ALT (SGPT): 14 U/L (ref 12–78)
AST (SGOT): 7 U/L — ABNORMAL LOW (ref 15–37)
Albumin: 3.6 gm/dl (ref 3.4–5.0)
Alk. phosphatase: 73 U/L (ref 45–117)
BUN: 33 mg/dl — ABNORMAL HIGH (ref 7–25)
Bilirubin, total: 0.4 mg/dl (ref 0.2–1.0)
CO2: 27 mEq/L (ref 21–32)
Calcium: 9.5 mg/dl (ref 8.5–10.1)
Chloride: 99 mEq/L (ref 98–107)
Creatinine: 1.3 mg/dl (ref 0.6–1.3)
GFR est AA: 53
GFR est non-AA: 44
Glucose: 154 mg/dl — ABNORMAL HIGH (ref 74–106)
Potassium: 4.5 mEq/L (ref 3.5–5.1)
Protein, total: 8.5 gm/dl — ABNORMAL HIGH (ref 6.4–8.2)
Sodium: 134 mEq/L — ABNORMAL LOW (ref 136–145)

## 2016-07-21 MED ORDER — SODIUM CHLORIDE 0.9 % IJ SYRG
Freq: Once | INTRAMUSCULAR | Status: AC
Start: 2016-07-21 — End: 2016-07-21
  Administered 2016-07-21: 22:00:00 via INTRAVENOUS

## 2016-07-21 MED ORDER — SODIUM CHLORIDE 0.9% BOLUS IV
0.9 % | INTRAVENOUS | Status: AC
Start: 2016-07-21 — End: 2016-07-21
  Administered 2016-07-21: 22:00:00 via INTRAVENOUS

## 2016-07-21 NOTE — ED Notes (Signed)
Pt c/o right hip pain, requesting pain medication.  Notified Bitzer, PA-C.

## 2016-07-21 NOTE — Other (Signed)
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 Spoke with Eliot Fordhristine Ward NP and she states no further action needed for this patient.

## 2016-07-21 NOTE — ED Triage Notes (Signed)
States she is "feeling bad".  Not elaborating.   Was at PCP for routine check up and was told her BP was low and to come to ER for further checkup.    Denies N/V/D.

## 2016-07-21 NOTE — ED Provider Notes (Signed)
[facility]  Emergency Department Treatment Report        Patient: [name] [name] Age: 66 y.o. Sex: female    Date of Birth: December 26, 1950 Admit Date: 07/21/2016 PCP: Lake Bells, MD   MRN: [id number]  CSN: [id number]  Attending: Jacklynn Barnacle, MD   Room: [phone] Time Dictated: 3:30 PM APP: Fredda Hammed, PA-C     Chief Complaint   Chief Complaint   Patient presents with   ??? Fatigue   ??? Hypotension       History of Present Illness   66 y.o. female with hx of DM and HTN who presents for evaluation of hypotension and fatigue.  Patient states she was at her pain management doctor earlier today for a routine visit and noted she had been feeling poorly since yesterday.  At pain management she was noted to have blood pressure readings of 66/47 and 72/56 and a they called her PCP who instructed her to come here. She describes feeling fatigued, weak and lightheaded. She notes she has a hx of hypertension and takes several medications for this.  She denies any recent changes in medications and states she had just taken her blood pressure medications prior to arrival at the pain management clinic.   She denies prior episodes of hypotension.  She is currently denying any other complaints including chest pain, shortness of breath, calf pain, leg swelling, vision changes, abdominal pain, dark stools, nausea, vomiting, diarrhea, urinary symptoms, fever, chills, URI symptoms, back pain, headache, numbness or tingling.  She has chronic right hip pain which is managed by pain management.  She denies history of anemia.  She admits to history of hepatitis C however was previously treated for this.    Review of Systems   Review of Systems   Constitutional: Positive for malaise/fatigue. Negative for chills and fever.   HENT: Negative for congestion and sore throat.    Eyes: Negative for blurred vision and double vision.   Respiratory: Negative for cough and shortness of breath.     Cardiovascular: Negative for chest pain and leg swelling.   Gastrointestinal: Negative for abdominal pain, blood in stool, diarrhea, melena, nausea and vomiting.   Genitourinary: Negative for dysuria, frequency and urgency.   Musculoskeletal: Positive for joint pain (right hip (chronic)). Negative for back pain and neck pain.   Skin: Negative for rash.   Neurological: Positive for weakness. Negative for tingling, loss of consciousness and headaches.        Lightheadedness       Past Medical/Surgical History     Past Medical History:   Diagnosis Date   ??? Arthritis    ??? Chronic pain    ??? Diabetes ([place])    ??? GERD (gastroesophageal reflux disease)    ??? Hypertension    ??? Liver disease     HEP C   ??? PTSD (post-traumatic stress disorder)     lived through the [place] in 1993     Past Surgical History:   Procedure Laterality Date   ??? HX ENDOSCOPY     ??? HX GYN  2010    hysterectomy   ??? HX HIP REPLACEMENT Right 01/29/2015   ??? HX HIP REPLACEMENT         Social History     Social History     Social History   ??? Marital status: MARRIED     Spouse name: N/A   ??? Number of children: N/A   ??? Years  of education: N/A     Occupational History   ??? Not on file.     Social History Main Topics   ??? Smoking status: Never Smoker   ??? Smokeless tobacco: Never Used   ??? Alcohol use No   ??? Drug use: No   ??? Sexual activity: No     Other Topics Concern   ??? Not on file     Social History Narrative       Family History     Family History   Problem Relation Age of Onset   ??? Hypertension Mother    ??? Cancer Maternal Aunt    ??? Alcohol abuse Maternal Grandfather        Current Medications     (Not in a hospital admission)    Allergies     Allergies   Allergen Reactions   ??? Chocolate [Cocoa] Sneezing       Physical Exam   ED Triage Vitals   ED Encounter Vitals Group      BP 07/21/16 1445 99/67      Pulse (Heart Rate) 07/21/16 1445 91      Resp Rate 07/21/16 1445 20      Temp 07/21/16 1445 98.5 ??F (36.9 ??C)      Temp src --        O2 Sat (%) 07/21/16 1445 96 %      Weight 07/21/16 1445 193 lb      Height 07/21/16 1445 5' 10"        Constitutional: Patient appears well developed and well nourished. Appears nontoxic and in no distress.   HENT: Normal cephalic and atraumatic.  Mucous membranes dry, non-erythematous and without exudates.   Eyes: Conjunctivae are clear with no discharge.  No palpebral conjunctival pallor.  Neck: Normal ROM, supple and non tender. No nuchal rigidity.   Respiratory: Breath sounds are equal bilaterally. Lungs clear to auscultation with nonlabored respirations. No tachypnea or accessory muscle use.  Cardiovascular: Normal rate and rhythm. Calves soft and non-tender. Distal pulses intact and symmetric.  No significant variscosities.    Gastrointestinal: Bowel sounds normal. Abdomen soft and nontender without complaint of pain to palpation. No masses.   Musculoskeletal: Moves all extremities well. No lower extremity edema.   Lymphatic: No cervical adenopathy.  Integumentary: Warm and dry without rashes or erythema.  Neurologic: Alert and oriented. No facial asymmetry or dysarthria.  Psychological: Behavior is age and situation appropriate. No difficulty with memory.      Impression and Management Plan   This is a 66 y.o. female with fatigue and hypotension. Patient is currently normotensive and not tachycardic or hypoxic.  Physical exam as stated above is unremarkable.  Will obtain appropriate labs to evaluate for hypovolemia versus infection.  Will give IV fluids.    Diagnostic Studies   Lab:   Recent Results (from the past 12 hour(s))   CBC WITH AUTOMATED DIFF    Collection Time: 07/21/16  4:35 PM   Result Value Ref Range    WBC 11.6 (H) 4.0 - 11.0 1000/mm3    RBC 4.58 3.60 - 5.20 M/uL    HGB 13.3 13.0 - 17.2 gm/dl    HCT 39.7 37.0 - 50.0 %    MCV 86.7 80.0 - 98.0 fL    MCH 29.0 25.4 - 34.6 pg    MCHC 33.5 30.0 - 36.0 gm/dl    PLATELET 254 140 - 450 1000/mm3    MPV 11.4 (H) 6.0 - 10.0 fL  RDW-SD 40.9 36.4 - 46.3      NRBC 0 0 - 0      IMMATURE GRANULOCYTES 0.3 0.0 - 3.0 %    NEUTROPHILS 65.6 (H) 34 - 64 %    LYMPHOCYTES 27.7 (L) 28 - 48 %    MONOCYTES 5.4 1 - 13 %    EOSINOPHILS 0.7 0 - 5 %    BASOPHILS 0.3 0 - 3 %   METABOLIC PANEL, COMPREHENSIVE    Collection Time: 07/21/16  4:35 PM   Result Value Ref Range    Sodium 134 (L) 136 - 145 mEq/L    Potassium 4.5 3.5 - 5.1 mEq/L    Chloride 99 98 - 107 mEq/L    CO2 27 21 - 32 mEq/L    Glucose 154 (H) 74 - 106 mg/dl    BUN 33 (H) 7 - 25 mg/dl    Creatinine 1.3 0.6 - 1.3 mg/dl    GFR est AA 53.0      GFR est non-AA 44      Calcium 9.5 8.5 - 10.1 mg/dl    AST (SGOT) 7 (L) 15 - 37 U/L    ALT (SGPT) 14 12 - 78 U/L    Alk. phosphatase 73 45 - 117 U/L    Bilirubin, total 0.4 0.2 - 1.0 mg/dl    Protein, total 8.5 (H) 6.4 - 8.2 gm/dl    Albumin 3.6 3.4 - 5.0 gm/dl   POC URINE MACROSCOPIC    Collection Time: 07/21/16  6:18 PM   Result Value Ref Range    Glucose Negative NEGATIVE,Negative mg/dl    Bilirubin Negative NEGATIVE,Negative      Ketone Trace (A) NEGATIVE,Negative mg/dl    Specific gravity 1.015 1.005 - 1.030      Blood Trace-lysed (A) NEGATIVE,Negative      pH (UA) 6.5 5 - 9      Protein Negative NEGATIVE,Negative mg/dl    Urobilinogen 1.0 0.0 - 1.0 EU/dl    Nitrites Negative NEGATIVE,Negative      Leukocyte Esterase Small (A) NEGATIVE,Negative      Color Yellow      Appearance Clear     POC URINE MICROSCOPIC    Collection Time: 07/21/16  6:18 PM   Result Value Ref Range    Epithelial cells, squamous 1-4 /LPF    Transitional epithelial cells OCCASIONAL /LPF    WBC 5-9 /HPF    RBC 5-9 /HPF    Bacteria 1+ /HPF     Labs Reviewed   CBC WITH AUTOMATED DIFF - Abnormal; Notable for the following:        Result Value    WBC 11.6 (*)     MPV 11.4 (*)     NEUTROPHILS 65.6 (*)     LYMPHOCYTES 27.7 (*)     All other components within normal limits   METABOLIC PANEL, COMPREHENSIVE - Abnormal; Notable for the following:     Sodium 134 (*)     Glucose 154 (*)      BUN 33 (*)     AST (SGOT) 7 (*)     Protein, total 8.5 (*)     All other components within normal limits   POC URINE MACROSCOPIC - Abnormal; Notable for the following:     Ketone Trace (*)     Blood Trace-lysed (*)     Leukocyte Esterase Small (*)     All other components within normal limits   CULTURE, URINE   POC URINE MICROSCOPIC  Imaging:    Xr Chest Pa Lat    Result Date: 07/21/2016  Indication: Hypotension.     IMPRESSION: Left lung nodule. Lingular and right middle lobe discoid atelectasis. Chronic obstructive lung disease. Comment: AP upright and lateral views of the chest reveal the lungs are hyperaerated from chronic obstructive disease. A 1.7 cm nodule is noted in the left lower lobe as seen on CT of March 22, 2016. Malignancy must be excluded. There is discoid atelectasis in the right middle lobe and lingula. The heart is of normal size. Pulmonary vasculature is within normal limits.       ED Course/Medical Decision Making   Patient did not develop any new or worsening symptoms and has remained stable during their course in the ED. Patient's blood pressure has improved after IV fluids and remained stable throughout her time in the ED.  Her workup is unremarkable.  Upon further review of prior visits, patient has blood pressure readings similar to those obtained today.  I feel the patient is stable for discharge at this time to follow up with primary care.  Instructed her to call her PCP tomorrow morning regarding her blood pressure medications.  Patient was instructed to return to the ER if condition worsens or new symptoms develop. The patient indicates understanding of these issues and agrees with the plan.     Final Diagnosis       ICD-10-CM ICD-9-CM   1. Hypotension, unspecified hypotension type I95.9 458.9       Disposition   Discharged in stable condition      Patient has been evaluated by myself and Dr. Jacklynn Barnacle who agrees with the above assessment and plan.    Fredda Hammed, PA-C   July 22, 2016  3:30 PM      My signature above authenticates this document and my orders, the final ??  diagnosis (es), discharge prescription (s), and instructions in the Epic ??  record.  If you have any questions please contact [phone].  ??  Nursing notes have been reviewed by the physician/ advanced practice ??  Clinician.

## 2016-07-21 NOTE — Telephone Encounter (Signed)
Yes, I gave them her last three. They wanted to know how we felt about it, I advised ED which they were thinking of doing anyway.

## 2016-07-21 NOTE — ED Notes (Signed)
7:07 PM  07/21/16     Discharge instructions given to patient (name) with verbalization of understanding. Patient accompanied by husband.  Patient discharged with the following prescriptions none. Patient discharged to home (destination). Pt escorted out of the ED via wheelchair.     Ginger Carneerra C Key, RN

## 2016-07-21 NOTE — Telephone Encounter (Signed)
Noted. Did we update them on recent blood pressures here in office?

## 2016-07-21 NOTE — Telephone Encounter (Signed)
Call from Dr. Trinna BalloonPennington's office. Pt is currently in office and they wanted her blood pressure history as it is currently very low in their office. First reading was 66/47, second was 72/56. She isn't feeling well, slow and fatigue and slightly slurred speech. They are going to advise pt to go to ED. Will let her PCP know what is going on.

## 2016-07-23 ENCOUNTER — Emergency Department: Admit: 2016-07-24 | Payer: MEDICARE | Primary: Physician Assistant

## 2016-07-23 DIAGNOSIS — J45901 Unspecified asthma with (acute) exacerbation: Secondary | ICD-10-CM

## 2016-07-23 LAB — CULTURE, URINE
CULTURE RESULT: >100000 — AB
Culture result: >100000 — AB

## 2016-07-23 NOTE — ED Provider Notes (Signed)
[facility]  Emergency Department Treatment Report        Patient: [name] [name] Age: 66 y.o. Sex: female    Date of Birth: Jun 15, 1950 Admit Date: 07/23/2016 PCP: Lake Bells, MD   MRN: [id number]  CSN: [id number]  Attending: Oneita Hurt, MD   Room: EZ66/QH47 Time Dictated: 11:04 PM APP: Raquel James, PA-C     Chief Complaint   Chief Complaint   Patient presents with   ??? Respiratory Distress   ??? Wheezing   ??? Cough       History of Present Illness   66 y.o. female with history of asthma, diabetes, and hypertension presenting via EMS in respiratory distress with coughing, shortness of breath and wheezing that started this evening.  During my exam, patient is in respiratory distress and is tachypneic on 2 L nasal cannula, speaking in short sentences and is unable to give me a good history.  Her husband is at bedside, patient does not use home O2, he states she has been feeling short of breath and coughing today, has been using her nebulizer at home with little improvement, last treatment was approximately 5 hours ago.  Patient also endorses chest pain, she points to the mid substernal area of her chest.  Per patient and her husband, she has no history of cardiac disease or blood clots.     Review of Systems   Review of Systems   Constitutional: Negative for chills and fever.   HENT: Negative for ear pain and sore throat.    Eyes: Negative for blurred vision and double vision.   Respiratory: Positive for cough, shortness of breath and wheezing. Negative for hemoptysis and sputum production.    Cardiovascular: Positive for chest pain. Negative for leg swelling.   Gastrointestinal: Negative for abdominal pain, nausea and vomiting.   Genitourinary: Negative for flank pain and hematuria.   Musculoskeletal: Positive for joint pain (chronic ). Negative for myalgias.   Skin: Negative for rash.   Neurological: Negative for tingling, sensory change and focal weakness.        Past Medical/Surgical History     Past Medical History:   Diagnosis Date   ??? Arthritis    ??? Chronic pain    ??? Diabetes ([place])    ??? GERD (gastroesophageal reflux disease)    ??? Hypertension    ??? Liver disease     HEP C   ??? PTSD (post-traumatic stress disorder)     lived through the [place] in 1993        Past Surgical History:   Procedure Laterality Date   ??? HX ENDOSCOPY     ??? HX GYN  2010    hysterectomy   ??? HX HIP REPLACEMENT Right 01/29/2015   ??? HX HIP REPLACEMENT         Social History     Social History     Social History   ??? Marital status: MARRIED     Spouse name: N/A   ??? Number of children: N/A   ??? Years of education: N/A     Occupational History   ??? Not on file.     Social History Main Topics   ??? Smoking status: Never Smoker   ??? Smokeless tobacco: Never Used   ??? Alcohol use No   ??? Drug use: No   ??? Sexual activity: No     Other Topics Concern   ??? Not on file  Social History Narrative       Family History     Family History   Problem Relation Age of Onset   ??? Hypertension Mother    ??? Cancer Maternal Aunt    ??? Alcohol abuse Maternal Grandfather        Current Medications     (Not in a hospital admission)  Prior to Admission Medications   Prescriptions Last Dose Informant Patient Reported? Taking?   Blood-Glucose Meter monitoring kit   No No   Sig: Check sugars 2 times per day, fasting and 2 hours after dinner   Lancets misc   No Yes   Sig: Check sugars twice daily   QUEtiapine SR (SEROQUEL XR) 150 mg sr tablet   No Yes   Sig: TAKE 1 TABLET BY MOUTH NIGHTLY   albuterol (ACCUNEB) 0.63 mg/3 mL nebulizer solution   No Yes   Sig: 3 mL by Nebulization route every six (6) hours as needed for Wheezing.   albuterol (PROVENTIL HFA, VENTOLIN HFA, PROAIR HFA) 90 mcg/actuation inhaler   No Yes   Sig: Take 2 Puffs by inhalation every six (6) hours as needed for Wheezing or Shortness of Breath.   amLODIPine (NORVASC) 10 mg tablet   No Yes   Sig: TAKE ONE TABLET BY MOUTH ONCE DAILY    cloNIDine HCl (CATAPRES) 0.3 mg tablet   No Yes   Sig: TAKE ONE TABLET BY MOUTH TWICE DAILY   gabapentin (NEURONTIN) 600 mg tablet   No Yes   Sig: TAKE 1 TABLET BY MOUTH THREE TIMES DAILY   glucose blood VI test strips (ASCENSIA AUTODISC VI, ONE TOUCH ULTRA TEST VI) strip   No Yes   Sig: Check sugars twice daily   ibuprofen (MOTRIN) 600 mg tablet   No Yes   Sig: Take 1 Tab by mouth every eight (8) hours as needed for Pain.   ipratropium-albuterol (COMBIVENT RESPIMAT) 20-100 mcg/actuation inhaler   No Yes   Sig: Take 1 Puff by inhalation every four (4) hours as needed for Wheezing.   losartan (COZAAR) 100 mg tablet   No Yes   Sig: Take 1 Tab by mouth daily. Indications: hypertension   magnesium oxide (MAG-OX) 400 mg tablet   No Yes   Sig: TAKE 1 TABLET BY MOUTH DAILY   metFORMIN ER (GLUCOPHAGE XR) 750 mg tablet   No Yes   Sig: TAKE 2 TABLETS EVERY DAY   oxyCODONE-acetaminophen (PERCOCET 10) 10-325 mg per tablet   No Yes   Sig: Take 1 Tab by mouth every eight (8) hours as needed for Pain. Max Daily Amount: 3 Tabs.   rosuvastatin (CRESTOR) 5 mg tablet   No Yes   Sig: Take 1 Tab by mouth nightly.   spironolactone (ALDACTONE) 100 mg tablet   No Yes   Sig: Take 1 Tab by mouth daily.   traZODone (DESYREL) 100 mg tablet   No Yes   Sig: Take 1 Tab by mouth nightly.      Facility-Administered Medications: None     Allergies     Allergies   Allergen Reactions   ??? Chocolate [Cocoa] Sneezing       Physical Exam   ED Triage Vitals   Enc Vitals Group      BP 07/23/16 2227 164/113      Pulse (Heart Rate) 07/23/16 2227 132      Resp Rate 07/23/16 2227 28      Temp 07/23/16 2227 99.2 ??F (37.3 ??C)  Temp src --       O2 Sat (%) 07/23/16 2227 93 %      Weight 07/23/16 2226 183 lb      Height 07/23/16 2226 5' 7"     Physical Exam   Constitutional: She is oriented to person, place, and time.   tachypneic female in respiratory distress using accessory muscles and speaking in short sentences   HENT:    Head: Normocephalic and atraumatic.   Mouth/Throat: Oropharynx is clear and moist.   Eyes: Conjunctivae are normal.   Neck: Normal range of motion. Neck supple.   Cardiovascular: Normal heart sounds and intact distal pulses.  Exam reveals no gallop and no friction rub.    No murmur heard.  Tachycardic rate, regular rhythm   Pulmonary/Chest: She is in respiratory distress. She has wheezes (diffuse insipratory and expiratory wheezes ).   Abdominal: Soft. She exhibits no distension. There is no tenderness. There is no rebound and no guarding.   Musculoskeletal: Normal range of motion.   Bilateral calves are soft and nontender to palpation, no lower extremity edema   Neurological: She is alert and oriented to person, place, and time.   Skin: Skin is warm and dry.   Nursing note and vitals reviewed.    Impression and Management Plan   66 year old female with history of asthma, diabetes and hypertension presenting in acute respiratory distress with chest pain, cough, wheezing and shortness of breath.  On my exam, patient is on 3 L nasal cannula with oxygen saturations in the upper 90s.  She has diffuse inspiratory and expiratory wheezes on exam. 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  we will also screen with a d-dimer.  We'll obtain chest x-ray and EKG.  We will monitor on a cardiac monitor.  We will start DuoNeb breathing treatments and IV Solu-Medrol.      Continuation by Dr. Geradine Girt:  Diagnostic Studies   Lab:   Recent Results (from the past 12 hour(s))   CBC WITH AUTOMATED DIFF    Collection Time: 07/23/16 10:55 PM   Result Value Ref Range    WBC 25.9 (H) 4.0 - 11.0 1000/mm3    RBC 5.07 3.60 - 5.20 M/uL    HGB 14.9 13.0 - 17.2 gm/dl    HCT 42.9 37.0 - 50.0 %     MCV 84.6 80.0 - 98.0 fL    MCH 29.4 25.4 - 34.6 pg    MCHC 34.7 30.0 - 36.0 gm/dl    PLATELET 369 140 - 450 1000/mm3    MPV 10.9 (H) 6.0 - 10.0 fL    RDW-SD 40.6 36.4 - 46.3      NRBC 0 0 - 0      IMMATURE GRANULOCYTES 0.5 0.0 - 3.0 %    NEUTROPHILS 78.2 (H) 34 - 64 %    LYMPHOCYTES 15.2 (L) 28 - 48 %    MONOCYTES 5.6 1 - 13 %    EOSINOPHILS 0.2 0 - 5 %    BASOPHILS 0.3 0 - 3 %   METABOLIC PANEL, BASIC    Collection Time: 07/23/16 10:55 PM   Result Value Ref Range    Sodium 138 136 - 145 mEq/L    Potassium 3.6 3.5 - 5.1 mEq/L    Chloride 104 98 - 107 mEq/L  CO2 24 21 - 32 mEq/L    Glucose 194 (H) 74 - 106 mg/dl    BUN 24 7 - 25 mg/dl    Creatinine 1.0 0.6 - 1.3 mg/dl    GFR est AA >60.0      GFR est non-AA 59      Calcium 9.5 8.5 - 10.1 mg/dl   POC TROPONIN-I    Collection Time: 07/23/16 10:58 PM   Result Value Ref Range    Troponin-I 0.00 0.00 - 0.07 ng/ml   D DIMER    Collection Time: 07/23/16 11:07 PM   Result Value Ref Range    D DIMER 0.70 (H) 0.01 - 0.50 ug/mL (FEU)   INFLUENZA A/B, H1N1 BY PCR    Collection Time: 07/23/16 11:39 PM   Result Value Ref Range    Influenza A PCR NEGATIVE NEGATIVE      Influenza B PCR NEGATIVE NEGATIVE         EXAM:  CT Angiography Chest With Intravenous Contrast  EXAM DATE/TIME:  07/23/2016 11:53 PM  CLINICAL HISTORY:  66 years old, female; Signs and symptoms; Shortness of breath  TECHNIQUE:  Axial computed tomographic angiography images of the chest with intravenous contrast using  pulmonary embolism protocol.  MIP reconstructed images were created and reviewed.  Coronal and sagittal reformatted images were created and reviewed.  CONTRAST:  80 mL of ISOVUE 370 administered intravenously.  COMPARISON:  No relevant prior studies available.  FINDINGS:  Pulmonary arteries: Dilution of contrast agent seen within the main pulmonary artery and proximal  central pulmonary arteries. No pulmonary embolism.  Aorta: Diffuse ectasia and aneurysmal dilatation of the thoracic aorta may  indicate chronic  hypertension. Ascending thoracic aorta measures 4.6 cm in diameter.  Lungs: Large pulmonary blebs are noted. Largest lead is seen adjacent to the medial aspect of the  left upper lobe. Noncalcified, 2 cm nodule left lower lobe. This nodule may contact the pleural  surface.  Pleural space: Normal. No significant effusion. No pneumothorax.  Heart: Normal. No cardiomegaly. No significant pericardial effusion. No evidence of RV  dysfunction.  Bones/joints: No acute fracture. No dislocation.  Laquanda, Bick Lilyauna Accession: [id number] MRN: [id number]   Preliminary Radiology Report  QUALITY ASSURANCE (QA) DISCREPANCY?  If there is a discrepancy between the preliminary and final interpretation, please notify [facility] via [email or web address].  If you do not have access to our QA portal, call our QA team at [phone]  CONFIDENTIALITY STATEMENT  This report is intended only for the use of the referring physician, and only in accordance with law, If you received this in error, call [phone]  Page 2 of 2  Soft tissues: Normal.  Lymph nodes: Normal. No enlarged lymph nodes.  IMPRESSION:  No PE or aortic dissection.  Multiple pulmonary bullae. No pneumothorax.  No evidence of pneumonia or pulmonary edema.  Aneurysmal dilatation of the ascending thoracic aorta with diffuse ectasia of the remainder of the  aorta. These findings may be secondary to chronic hypertension. No rupture of the aorta.  2 cm smooth heterogeneous nodule within the left lower lobe. For low-risk or high-risk patients  consider a follow-up chest CT at 3 months. If unchanged consider an additional follow-up CT at 18-  24 months. Alternatively (or additionally) PET/CT or tissue sampling could be performed.  Thank you for allowing [place] to participate in the care of your patient.  Dictated and Authenticated by: Margarita Sermons, MD  07/24/2016 12:43 AM [place] Time ([place] & [place])  EKG: On my reading shows sinus tachycardia, no acute ischemic changes are appreciated    ED Course/Medical Decision Making   Hemodynamically stable through her stay in the emergency department.  Patiently much better after  Treatment.  Wheezing is resolved.  Lungs are clear.    CT scan of the chest was obtained to rule out pulmonary embolism due to the patient's dyspnea.  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    Patient has a high white blood cell count, and lactic acidosis.  For these reasons blood cultures were obtained, and patient started on the sepsis protocol.    She was discussed with Dr. Clydene Fake, she'll be admitted to a medical bed for further evaluation.    Medications   piperacillin-tazobactam (ZOSYN) 4.5 g in 0.9% sodium chloride 100 mL IVPB (not administered)   piperacillin-tazobactam (ZOSYN) 3.375 g in 0.9% sodium chloride 100 mL IVPB (not administered)   vancomycin (VANCOCIN) 1750 mg in NS 500 ml infusion (not administered)   methylPREDNISolone (PF) (SOLU-MEDROL) injection 125 mg (125 mg IntraVENous Given 07/23/16 2308)   albuterol-ipratropium (DUO-NEB) 2.5 MG-0.5 MG/3 ML (3 mL Nebulization Given 07/23/16 2251)   iopamidol (ISOVUE-370) 76 % injection 80 mL (80 mL IntraVENous Given 07/23/16 2357)   acetaminophen (TYLENOL) tablet 975 mg (975 mg Oral Given 07/24/16 0038)   sodium chloride 0.9 % bolus infusion 2,490 mL (2,490 mL IntraVENous New Bag 07/24/16 0134)     Final Diagnosis       ICD-10-CM ICD-9-CM   1. Lactic acidosis E87.2 276.2   2. Leukocytosis, unspecified type D72.829 288.60   3. Sepsis, due to unspecified organism ([place]) A41.9 038.9     995.91   4. Severe asthma with acute exacerbation, unspecified whether persistent J45.901 493.92         Disposition   Medical admission under [facility] hospitalist service    Jarold Song, PA-C  July 23, 2016  11:04 PM    The patient was personally evaluated by myself and Dr. Oneita Hurt,  MD who agrees with the above assessment and plan.  Please note that portions of this note were completed with a voice recognition program. Efforts were made to edit the dictations but occasionally words are mis-transcribed.   My signature above authenticates this document and my orders, the final ??  diagnosis (es), discharge prescription (s), and instructions in the Epic ??  record.  If you have any questions please contact [phone].  ??  Nursing notes have been reviewed by the physician/ advanced practice ??  Clinician.

## 2016-07-24 ENCOUNTER — Inpatient Hospital Stay
Admit: 2016-07-24 | Discharge: 2016-07-27 | Disposition: A | Discharge status: Home / Self Care | Payer: MEDICARE | Attending: Internal Medicine | Admitting: Internal Medicine

## 2016-07-24 LAB — EKG 12-LEAD
Atrial Rate: 123 {beats}/min
P Axis: 72 degrees
P-R Interval: 150 ms
Q-T Interval: 296 ms
QRS Duration: 74 ms
QTc Calculation (Bazett): 423 ms
R Axis: 54 degrees
T Axis: 71 degrees
Ventricular Rate: 123 {beats}/min

## 2016-07-24 LAB — D-DIMER, QUANTITATIVE: D-Dimer, Quant: 0.7 ug/mL (FEU) — ABNORMAL HIGH (ref 0.01–0.50)

## 2016-07-24 LAB — TSH 3RD GENERATION: TSH: 0.147 u[IU]/mL — ABNORMAL LOW (ref 0.358–3.740)

## 2016-07-24 LAB — EKG, 12 LEAD, INITIAL
Atrial Rate: 123 {beats}/min
Calculated P Axis: 72 degrees
Calculated R Axis: 54 degrees
Calculated T Axis: 71 degrees
P-R Interval: 150 ms
Q-T Interval: 296 ms
QRS Duration: 74 ms
QTC Calculation (Bezet): 423 ms
Ventricular Rate: 123 {beats}/min

## 2016-07-24 LAB — INFLUENZA A/B, BY PCR
Influenza A PCR: NEGATIVE
Influenza B PCR: NEGATIVE

## 2016-07-24 LAB — LACTIC ACID
Lactic Acid: 4.1 mmol/L — CR (ref 0.4–2.0)
Lactic Acid: 5.7 mmol/L — CR (ref 0.4–2.0)
Lactic Acid: 5.6 mmol/L — CR (ref 0.4–2.0)
Lactic Acid: 4.6 mmol/L — CR (ref 0.4–2.0)

## 2016-07-24 LAB — CBC WITH AUTOMATED DIFF
BASOPHILS: 0.3 % (ref 0–3)
EOSINOPHILS: 0.2 % (ref 0–5)
HCT: 42.9 % (ref 37.0–50.0)
HGB: 14.9 gm/dl (ref 13.0–17.2)
IMMATURE GRANULOCYTES: 0.5 % (ref 0.0–3.0)
LYMPHOCYTES: 15.2 % — ABNORMAL LOW (ref 28–48)
MCH: 29.4 pg (ref 25.4–34.6)
MCHC: 34.7 gm/dl (ref 30.0–36.0)
MCV: 84.6 fL (ref 80.0–98.0)
MONOCYTES: 5.6 % (ref 1–13)
MPV: 10.9 fL — ABNORMAL HIGH (ref 6.0–10.0)
NEUTROPHILS: 78.2 % — ABNORMAL HIGH (ref 34–64)
NRBC: 0 (ref 0–0)
PLATELET: 369 10*3/uL (ref 140–450)
RBC: 5.07 M/uL (ref 3.60–5.20)
RDW-SD: 40.6 (ref 36.4–46.3)
WBC: 25.9 10*3/uL — ABNORMAL HIGH (ref 4.0–11.0)

## 2016-07-24 LAB — POC SEPSIS PANEL
ALLENS TEST: POSITIVE
BASE EXCESS: -2 mmol/L (ref <=3)
BICARBONATE: 22.8 mmol/L (ref 18.0–26.0)
CALCIUM,IONIZED: 4.8 mg/dL (ref 4.40–5.40)
CO2, TOTAL: 24 mmol/L (ref 24–29)
Chloride: 103 mEq/L (ref 98–107)
Creatinine: 0.8 mg/dl (ref 0.6–1.3)
FIO2: 21
Glucose: 293 mg/dL — ABNORMAL HIGH (ref 74–106)
HCT: 38 % (ref 38–45)
HGB: 12.8 gm/dl (ref 12.4–17.2)
Lactic Acid: 3.48 mmol/L — ABNORMAL HIGH (ref 0.40–2.00)
O2 SAT: 94.7 % (ref 90–100)
PCO2: 37.7 mm Hg (ref 35.0–45.0)
PO2: 74.9 mm Hg — ABNORMAL LOW (ref 75–100)
Patient temp.: 98.8
Potassium: 4.7 mEq/L (ref 3.5–4.9)
Sodium: 139 mEq/L (ref 136–145)
pH: 7.391 (ref 7.350–7.450)

## 2016-07-24 LAB — URINALYSIS W/ RFLX MICROSCOPIC
Bilirubin: NEGATIVE
Blood: NEGATIVE
Glucose: NEGATIVE mg/dl
Ketone: NEGATIVE mg/dl
Nitrites: NEGATIVE
Protein: 100 mg/dl — AB
Specific gravity: <=1.005 (ref 1.005–1.030)
Urobilinogen: 1 mg/dl (ref 0.0–1.0)
pH (UA): 5.5 (ref 5.0–9.0)

## 2016-07-24 LAB — GLUCOSE, POC
Glucose (POC): 243 mg/dL — ABNORMAL HIGH (ref 65–105)
Glucose (POC): 206 mg/dL — ABNORMAL HIGH (ref 65–105)
Glucose (POC): 340 mg/dL — ABNORMAL HIGH (ref 65–105)
Glucose (POC): 256 mg/dL — ABNORMAL HIGH (ref 65–105)

## 2016-07-24 LAB — POC TROPONIN: Troponin-I: 0 ng/ml (ref 0.00–0.07)

## 2016-07-24 LAB — POC URINE MICROSCOPIC

## 2016-07-24 LAB — HEPATIC FUNCTION PANEL
ALT (SGPT): 10 U/L — ABNORMAL LOW (ref 12–78)
AST (SGOT): 4 U/L — ABNORMAL LOW (ref 15–37)
Albumin: 3.8 gm/dl (ref 3.4–5.0)
Alk. phosphatase: 78 U/L (ref 45–117)
Bilirubin, direct: 0.1 mg/dl (ref 0.0–0.2)
Bilirubin, total: 0.5 mg/dl (ref 0.2–1.0)
Protein, total: 8.8 gm/dl — ABNORMAL HIGH (ref 6.4–8.2)

## 2016-07-24 LAB — D DIMER: D DIMER: 0.7 ug/mL (FEU) — ABNORMAL HIGH (ref 0.01–0.50)

## 2016-07-24 LAB — METABOLIC PANEL, BASIC
BUN: 24 mg/dl (ref 7–25)
CO2: 24 mEq/L (ref 21–32)
Calcium: 9.5 mg/dl (ref 8.5–10.1)
Chloride: 104 mEq/L (ref 98–107)
Creatinine: 1 mg/dl (ref 0.6–1.3)
GFR est AA: >60
GFR est non-AA: 59
Glucose: 194 mg/dl — ABNORMAL HIGH (ref 74–106)
Potassium: 3.6 mEq/L (ref 3.5–5.1)
Sodium: 138 mEq/L (ref 136–145)

## 2016-07-24 LAB — LD: LD: 154 U/L (ref 84–246)

## 2016-07-24 LAB — HEMOGLOBIN A1C W/O EAG: Hemoglobin A1c: 6.5 % — ABNORMAL HIGH (ref 4.8–6.0)

## 2016-07-24 LAB — T4, FREE: Free T4: 1.61 ng/dl — ABNORMAL HIGH (ref 0.76–1.46)

## 2016-07-24 LAB — PROCALCITONIN: PROCALCITONIN: <0.05 ng/ml (ref 0.00–0.50)

## 2016-07-24 MED ORDER — VANCOMYCIN IN 0.9 % SODIUM CHLORIDE 1.75 GRAM/500 ML IV
1.75 gram/500 mL | Freq: Once | INTRAVENOUS | Status: AC
Start: 2016-07-24 — End: 2016-07-24
  Administered 2016-07-24: 09:00:00 via INTRAVENOUS

## 2016-07-24 MED ORDER — PROMETHAZINE 25 MG TAB
25 mg | Freq: Four times a day (QID) | ORAL | Status: DC | PRN
Start: 2016-07-24 — End: 2016-07-27

## 2016-07-24 MED ORDER — SODIUM CHLORIDE 0.9 % IV
INTRAVENOUS | Status: AC
Start: 2016-07-24 — End: 2016-07-24
  Administered 2016-07-24 (×2): via INTRAVENOUS

## 2016-07-24 MED ORDER — INSULIN LISPRO 100 UNIT/ML INJECTION
100 unit/mL | Freq: Four times a day (QID) | SUBCUTANEOUS | Status: DC
Start: 2016-07-24 — End: 2016-07-27
  Administered 2016-07-24 – 2016-07-27 (×11): via SUBCUTANEOUS

## 2016-07-24 MED ORDER — CLONIDINE 0.2 MG TAB
0.2 mg | Freq: Two times a day (BID) | ORAL | Status: DC
Start: 2016-07-24 — End: 2016-07-25
  Administered 2016-07-24 (×2): via ORAL

## 2016-07-24 MED ORDER — ROSUVASTATIN 5 MG TAB
5 mg | Freq: Every evening | ORAL | Status: DC
Start: 2016-07-24 — End: 2016-07-27
  Administered 2016-07-25 – 2016-07-27 (×3): via ORAL

## 2016-07-24 MED ORDER — IPRATROPIUM-ALBUTEROL 2.5 MG-0.5 MG/3 ML NEB SOLUTION
2.5 mg-0.5 mg/3 ml | RESPIRATORY_TRACT | Status: AC
Start: 2016-07-24 — End: 2016-07-23
  Administered 2016-07-24: 04:00:00 via RESPIRATORY_TRACT

## 2016-07-24 MED ORDER — IPRATROPIUM-ALBUTEROL 2.5 MG-0.5 MG/3 ML NEB SOLUTION
2.5 mg-0.5 mg/3 ml | RESPIRATORY_TRACT | Status: AC
Start: 2016-07-24 — End: 2016-07-23

## 2016-07-24 MED ORDER — SODIUM CHLORIDE 0.9 % IV
INTRAVENOUS | Status: AC
Start: 2016-07-24 — End: 2016-07-25
  Administered 2016-07-24 – 2016-07-25 (×2): via INTRAVENOUS

## 2016-07-24 MED ORDER — VANCOMYCIN IN 0.9 % SODIUM CHLORIDE 1.25 GRAM/250 ML IV
1.25 gram/250 mL | Freq: Two times a day (BID) | INTRAVENOUS | Status: DC
Start: 2016-07-24 — End: 2016-07-25
  Administered 2016-07-24 – 2016-07-25 (×3): via INTRAVENOUS

## 2016-07-24 MED ORDER — LOSARTAN 50 MG TAB
50 mg | Freq: Every day | ORAL | Status: DC
Start: 2016-07-24 — End: 2016-07-24
  Administered 2016-07-24: 14:00:00 via ORAL

## 2016-07-24 MED ORDER — IOPAMIDOL 76 % IV SOLN
370 mg iodine /mL (76 %) | Freq: Once | INTRAVENOUS | Status: AC
Start: 2016-07-24 — End: 2016-07-23
  Administered 2016-07-24: 05:00:00 via INTRAVENOUS

## 2016-07-24 MED ORDER — MELATONIN 3 MG TAB
3 mg | Freq: Every evening | ORAL | Status: DC | PRN
Start: 2016-07-24 — End: 2016-07-27
  Administered 2016-07-24 – 2016-07-25 (×2): via ORAL

## 2016-07-24 MED ORDER — OXYCODONE-ACETAMINOPHEN 10 MG-325 MG TAB
10-325 mg | Freq: Three times a day (TID) | ORAL | Status: DC | PRN
Start: 2016-07-24 — End: 2016-07-27
  Administered 2016-07-24 – 2016-07-27 (×8): via ORAL

## 2016-07-24 MED ORDER — IBUPROFEN 600 MG TAB
600 mg | Freq: Four times a day (QID) | ORAL | Status: DC | PRN
Start: 2016-07-24 — End: 2016-07-27
  Administered 2016-07-24 – 2016-07-25 (×4): via ORAL

## 2016-07-24 MED ORDER — ALBUTEROL SULFATE 0.083 % (0.83 MG/ML) SOLN FOR INHALATION
2.5 mg /3 mL (0.083 %) | RESPIRATORY_TRACT | Status: DC | PRN
Start: 2016-07-24 — End: 2016-07-27

## 2016-07-24 MED ORDER — DEXTROSE 50% IN WATER (D50W) IV
INTRAVENOUS | Status: DC | PRN
Start: 2016-07-24 — End: 2016-07-27

## 2016-07-24 MED ORDER — GLUCAGON 1 MG INJECTION
1 mg | INTRAMUSCULAR | Status: DC | PRN
Start: 2016-07-24 — End: 2016-07-27

## 2016-07-24 MED ORDER — SODIUM CHLORIDE 0.9% BOLUS IV
0.9 % | Freq: Once | INTRAVENOUS | Status: AC
Start: 2016-07-24 — End: 2016-07-24
  Administered 2016-07-24: 20:00:00 via INTRAVENOUS

## 2016-07-24 MED ORDER — ACETAMINOPHEN 325 MG TABLET
325 mg | ORAL | Status: AC
Start: 2016-07-24 — End: 2016-07-24
  Administered 2016-07-24: 06:00:00 via ORAL

## 2016-07-24 MED ORDER — GUAIFENESIN 100 MG/5 ML ORAL LIQUID
100 mg/5 mL | ORAL | Status: DC | PRN
Start: 2016-07-24 — End: 2016-07-27

## 2016-07-24 MED ORDER — ENOXAPARIN 40 MG/0.4 ML SUB-Q SYRINGE
40 mg/0.4 mL | SUBCUTANEOUS | Status: DC
Start: 2016-07-24 — End: 2016-07-27
  Administered 2016-07-25 – 2016-07-27 (×3): via SUBCUTANEOUS

## 2016-07-24 MED ORDER — METHYLPREDNISOLONE (PF) 125 MG/2 ML IJ SOLR
125 mg/2 mL | Freq: Once | INTRAMUSCULAR | Status: AC
Start: 2016-07-24 — End: 2016-07-23
  Administered 2016-07-24: 04:00:00 via INTRAVENOUS

## 2016-07-24 MED ORDER — DEXTROSE 5% IN WATER (D5W) IV
40.5 gram | Freq: Three times a day (TID) | INTRAVENOUS | Status: AC
Start: 2016-07-24 — End: 2016-07-25
  Administered 2016-07-24 – 2016-07-25 (×3): via INTRAVENOUS

## 2016-07-24 MED ORDER — PHARMACY VANCOMYCIN NOTE
Freq: Once | Status: DC
Start: 2016-07-24 — End: 2016-07-25

## 2016-07-24 MED ORDER — ALBUTEROL SULFATE HFA 90 MCG/ACTUATION AEROSOL INHALER
90 mcg/actuation | Freq: Four times a day (QID) | RESPIRATORY_TRACT | Status: DC
Start: 2016-07-24 — End: 2016-07-24

## 2016-07-24 MED ORDER — AMLODIPINE 5 MG TAB
5 mg | Freq: Every day | ORAL | Status: DC
Start: 2016-07-24 — End: 2016-07-27
  Administered 2016-07-24 – 2016-07-27 (×4): via ORAL

## 2016-07-24 MED ORDER — IPRATROPIUM-ALBUTEROL 2.5 MG-0.5 MG/3 ML NEB SOLUTION
2.5 mg-0.5 mg/3 ml | Freq: Four times a day (QID) | RESPIRATORY_TRACT | Status: DC | PRN
Start: 2016-07-24 — End: 2016-07-27
  Administered 2016-07-26: 16:00:00 via RESPIRATORY_TRACT

## 2016-07-24 MED ORDER — PIPERACILLIN-TAZOBACTAM 40.5 GRAM IV SOLUTION
40.5 gram | Freq: Once | INTRAVENOUS | Status: AC
Start: 2016-07-24 — End: 2016-07-24
  Administered 2016-07-24: 08:00:00 via INTRAVENOUS

## 2016-07-24 MED ORDER — ALBUTEROL SULFATE 0.083 % (0.83 MG/ML) SOLN FOR INHALATION
2.5 mg /3 mL (0.083 %) | RESPIRATORY_TRACT | Status: DC | PRN
Start: 2016-07-24 — End: 2016-07-24

## 2016-07-24 MED ORDER — GABAPENTIN 300 MG CAP
300 mg | Freq: Three times a day (TID) | ORAL | Status: DC
Start: 2016-07-24 — End: 2016-07-27
  Administered 2016-07-24 – 2016-07-27 (×11): via ORAL

## 2016-07-24 MED ORDER — PIPERACILLIN-TAZOBACTAM 40.5 GRAM IV SOLUTION
40.5 gram | Freq: Three times a day (TID) | INTRAVENOUS | Status: DC
Start: 2016-07-24 — End: 2016-07-24
  Administered 2016-07-24: 15:00:00 via INTRAVENOUS

## 2016-07-24 MED ORDER — BENZONATATE 100 MG CAP
100 mg | Freq: Three times a day (TID) | ORAL | Status: DC | PRN
Start: 2016-07-24 — End: 2016-07-27

## 2016-07-24 MED ORDER — SODIUM CHLORIDE 0.9% BOLUS IV
0.9 % | Freq: Once | INTRAVENOUS | Status: AC
Start: 2016-07-24 — End: 2016-07-24
  Administered 2016-07-24: 07:00:00 via INTRAVENOUS

## 2016-07-24 MED ORDER — PREDNISONE 20 MG TAB
20 mg | Freq: Every day | ORAL | Status: DC
Start: 2016-07-24 — End: 2016-07-27
  Administered 2016-07-24 – 2016-07-27 (×4): via ORAL

## 2016-07-24 MED ORDER — SPIRONOLACTONE 100 MG TAB
100 mg | Freq: Every day | ORAL | Status: DC
Start: 2016-07-24 — End: 2016-07-24

## 2016-07-24 MED ORDER — QUETIAPINE SR 50 MG 24 HR TAB
50 mg | Freq: Every evening | ORAL | Status: DC
Start: 2016-07-24 — End: 2016-07-27
  Administered 2016-07-24 – 2016-07-27 (×4): via ORAL

## 2016-07-24 MED ORDER — PHARMACY VANCOMYCIN NOTE
Status: DC
Start: 2016-07-24 — End: 2016-07-26

## 2016-07-24 MED ORDER — TRAZODONE 100 MG TAB
100 mg | Freq: Every evening | ORAL | Status: DC
Start: 2016-07-24 — End: 2016-07-27
  Administered 2016-07-24 – 2016-07-27 (×5): via ORAL

## 2016-07-24 MED ORDER — ONDANSETRON (PF) 4 MG/2 ML INJECTION
4 mg/2 mL | Freq: Four times a day (QID) | INTRAMUSCULAR | Status: DC | PRN
Start: 2016-07-24 — End: 2016-07-27

## 2016-07-24 MED ORDER — ALBUTEROL SULFATE 0.083 % (0.83 MG/ML) SOLN FOR INHALATION
2.5 mg /3 mL (0.083 %) | Freq: Four times a day (QID) | RESPIRATORY_TRACT | Status: DC
Start: 2016-07-24 — End: 2016-07-24
  Administered 2016-07-24: 13:00:00 via RESPIRATORY_TRACT

## 2016-07-24 MED ORDER — ACETAMINOPHEN 325 MG TABLET
325 mg | Freq: Four times a day (QID) | ORAL | Status: DC | PRN
Start: 2016-07-24 — End: 2016-07-27
  Administered 2016-07-24 – 2016-07-27 (×2): via ORAL

## 2016-07-24 MED FILL — SODIUM CHLORIDE 0.9 % IV: INTRAVENOUS | Qty: 1000

## 2016-07-24 MED FILL — INSULIN LISPRO 100 UNIT/ML INJECTION: 100 unit/mL | SUBCUTANEOUS | Qty: 200

## 2016-07-24 MED FILL — ZOSYN 40.5 GRAM INTRAVENOUS SOLUTION: 40.5 gram | INTRAVENOUS | Qty: 40.5

## 2016-07-24 MED FILL — VANCOMYCIN IN 0.9 % SODIUM CHLORIDE 1.25 GRAM/250 ML IV: 1.25 gram/250 mL | INTRAVENOUS | Qty: 250

## 2016-07-24 MED FILL — PHARMACY VANCOMYCIN NOTE: Qty: 1

## 2016-07-24 MED FILL — PREDNISONE 20 MG TAB: 20 mg | ORAL | Qty: 2

## 2016-07-24 MED FILL — SPIRONOLACTONE 100 MG TAB: 100 mg | ORAL | Qty: 1

## 2016-07-24 MED FILL — SOLU-MEDROL (PF) 125 MG/2 ML SOLUTION FOR INJECTION: 125 mg/2 mL | INTRAMUSCULAR | Qty: 2

## 2016-07-24 MED FILL — VANCOMYCIN IN 0.9 % SODIUM CHLORIDE 1.75 GRAM/500 ML IV: 1.75 gram/500 mL | INTRAVENOUS | Qty: 500

## 2016-07-24 MED FILL — ISOVUE-370  76 % INTRAVENOUS SOLUTION: 370 mg iodine /mL (76 %) | INTRAVENOUS | Qty: 80

## 2016-07-24 MED FILL — LOSARTAN 50 MG TAB: 50 mg | ORAL | Qty: 2

## 2016-07-24 MED FILL — GABAPENTIN 100 MG CAP: 100 mg | ORAL | Qty: 6

## 2016-07-24 MED FILL — TRAZODONE 100 MG TAB: 100 mg | ORAL | Qty: 1

## 2016-07-24 MED FILL — CLONIDINE 0.2 MG TAB: 0.2 mg | ORAL | Qty: 1

## 2016-07-24 MED FILL — IBUPROFEN 600 MG TAB: 600 mg | ORAL | Qty: 1

## 2016-07-24 MED FILL — ACETAMINOPHEN 325 MG TABLET: 325 mg | ORAL | Qty: 2

## 2016-07-24 MED FILL — SEROQUEL XR 50 MG TABLET,EXTENDED RELEASE: 50 mg | ORAL | Qty: 3

## 2016-07-24 MED FILL — AMLODIPINE 5 MG TAB: 5 mg | ORAL | Qty: 2

## 2016-07-24 MED FILL — OXYCODONE-ACETAMINOPHEN 10 MG-325 MG TAB: 10-325 mg | ORAL | Qty: 1

## 2016-07-24 MED FILL — ACETAMINOPHEN 325 MG TABLET: 325 mg | ORAL | Qty: 3

## 2016-07-24 MED FILL — ALBUTEROL SULFATE 2.5 MG/0.5 ML NEB SOLUTION: 2.5 mg/0.5 mL | RESPIRATORY_TRACT | Qty: 0.5

## 2016-07-24 MED FILL — ALBUTEROL SULFATE 0.083 % (0.83 MG/ML) SOLN FOR INHALATION: 2.5 mg /3 mL (0.083 %) | RESPIRATORY_TRACT | Qty: 1

## 2016-07-24 MED FILL — IPRATROPIUM-ALBUTEROL 2.5 MG-0.5 MG/3 ML NEB SOLUTION: 2.5 mg-0.5 mg/3 ml | RESPIRATORY_TRACT | Qty: 3

## 2016-07-24 MED FILL — MELATONIN 3 MG TAB: 3 mg | ORAL | Qty: 1

## 2016-07-24 NOTE — ED Notes (Signed)
Courtesy attempt for report. Floor refused report again. PT will go to floor at 315 per SBAR protocol.

## 2016-07-24 NOTE — Other (Signed)
Bedside and Verbal shift change report given to Selinda EonMegan Kay, RN (oncoming nurse) by Annia BeltAnel, RN (offgoing nurse). Report included the following information SBAR, Intake/Output, MAR and Recent Results.

## 2016-07-24 NOTE — Progress Notes (Signed)
Problem: Falls - Risk of  Goal: *Absence of Falls  Document Schmid Fall Risk and appropriate interventions in the flowsheet.  Fall Risk Interventions:

## 2016-07-24 NOTE — Other (Signed)
Spoke to dr Wenda Lowmatriano regarding patient's 2nd  lactic acid of 5.7 compared for the 1st lactic acid of 4.5. VS stable and Patient is on Iv fluids. No orders received. Will continue monitor pt condition.

## 2016-07-24 NOTE — ED Notes (Signed)
2:57 AM  07/24/16     Attempted to call report to 5 east (nurse) who is unable to take report at this time.  Patient to be transported to 5 east (unit) at 0315 (time).  Via stretcher (means of transport) Escorted by tech Counselling psychologist(Staff member).  With NC (equipment) On 2L (O2).     Wyvonne LenzEvan R Mack

## 2016-07-24 NOTE — Progress Notes (Signed)
07/24/16 0556   Critical Result Types   Type of Critical Result Laboratory   Critical Lab Result Types   Critical Lab Value Lactic Acid   Lactic Acid Value 5.7   Notification Information   Notified By (Name) theresa whitfield   Time of Critical Result Notification 0556   Verbal Readback Provided Yes   primary nurse ma nena snak made aware of the result

## 2016-07-24 NOTE — Progress Notes (Signed)
Patient admitted on 07/23/2016 from home with   Chief Complaint   Patient presents with   ??? Respiratory Distress   ??? Wheezing   ??? Cough        The patient has been admitted to the hospital 0 times in the past 12 months.      Tentative dc plan:    Home with husband and at time denied needs for home. Her father also lives with them    Facility if plan     Anticipated Discharge Date:   2.13.18?    PCP: Littie Deeds, MD         Face sheet information, address, contact info and insurance verified yes    Dialysis Unit/ chair time / access:    none    DME at home rolling walker/cane and chair lift    O2 at home  none     Home Environment:    Lives at [address] [place] [address]    [phone].     Prior to admission open services:     none    Home Health Agency-     none    Personal Care Agency-    none    Extended Emergency Contact Information  Primary Emergency Contact: Goodwin,Bobby  Address: [address]           [place], [place] [address] [place]  Home Phone: [phone]  Mobile Phone: [phone]  Relation: Spouse      Transportation:     husband will transport home        Case Management Assessment                          PRIMARY DECISION MAKER   Primary Decision Maker Name: bobby goodwin   Primary Decision Maker Phone Number: cellphone                           CARE MANAGEMENT INTERVENTIONS   Readmission Interview Completed: Not Applicable   PCP Verified by CM: Yes           Mode of Transport at Discharge: Other (see comment) (husband)       Transition of Care Consult (CM Consult): Discharge Planning               Discharge Durable Medical Equipment: Yes (has rolling walker and cane and chair lift to sec floor)   Physical Therapy Consult: No   Occupational Therapy Consult: No   Speech Therapy Consult: No   Current Support Network: Lives with Spouse, Own Home   Reason for Referral: DCP Rounds   History Provided By: Patient, Medical Record    Patient Orientation: Alert and Oriented   Cognition: Alert   Support System Response: Concerned   Previous Living Arrangement: Lives with Family Dependent   Home Accessibility: Steps, Multi Level Home (2 and 14-15 to bedroom)   Prior Functional Level: Assistance with the following:, Bathing, Mobility, Cooking, Housework, Shopping   Current Functional Level: Bathing, Mobility, Cooking, Housework, Shopping (per pt husband able to assist with ADLS as needed)   Primary Language: English   Can patient return to prior living arrangement: Yes   Ability to make needs known:: Good   Family able to assist with home care needs:: Yes               Types of Needs Identified: Disease Management Education,  Treatment Education       Confirm Follow Up Transport: Family       Plan discussed with Pt/Family/Caregiver: Yes   Freedom of Choice Offered: Yes      DISCHARGE LOCATION   Discharge Placement: Home with family assistance

## 2016-07-24 NOTE — H&P (Addendum)
Medicine History and Physical    Patient: [name] [name]   Age:  65 y.o.    Assessment     Asthma exacerbation  Leukocytosis  Lactic acidosis possible from sepsis or metformin  Rule out sepsis  DM2  HTN      Plan     O2 supplementation  Bronchodilators, steroids  Deescalate abx  Monitor WBC  Monitor for signs of infection  Blood CS  Monitor lactic acid  Metformin on hold  Accucheck, insulin correction scale, hypoglycemic protocol  F/u CT chest done in ER  DVT ppx    Further recommendations based on clinical course. Discussed with patient current assessment and plan and answered all question. Thank you very much for allowing me to participate in this very pleasant patient's care.     DISPO  -Pt to be admitted  at this time for reasons addressed above, continued hospitalization for ongoing assessment and treatment indicated     Anticipated Date of Discharge: 3 days  Anticipated Disposition (home, SNF) : home      HPI:   [name] [name] is a 66 y.o. year old female who presents with  Asthma attack.    Patient is a very pleasant 66 yo F with asthma started to have SOB cough similar to prior asthma exacerbation. She was in resp distress and very dyspneic, arrived in ER received steroids and bronchodilators. She had CT chest done. She was noted to have elevated WBC. No fever, no chills, no N/V/D.     Review of Systems - 12 point ROS done pertinent positive and negative as stated in HPI      Past Medical History:  Past Medical History:   Diagnosis Date   ??? Arthritis    ??? Chronic pain    ??? Diabetes ([place])    ??? GERD (gastroesophageal reflux disease)    ??? Hypertension    ??? Liver disease     HEP C   ??? PTSD (post-traumatic stress disorder)     lived through the [place] in 1993       Past Surgical History:  Past Surgical History:   Procedure Laterality Date   ??? HX ENDOSCOPY     ??? HX GYN  2010    hysterectomy   ??? HX HIP REPLACEMENT Right 01/29/2015   ??? HX HIP REPLACEMENT         Family History:  Family History    Problem Relation Age of Onset   ??? Hypertension Mother    ??? Cancer Maternal Aunt    ??? Alcohol abuse Maternal Grandfather        Social History:  Social History     Social History   ??? Marital status: MARRIED     Spouse name: N/A   ??? Number of children: N/A   ??? Years of education: N/A     Social History Main Topics   ??? Smoking status: Never Smoker   ??? Smokeless tobacco: Never Used   ??? Alcohol use No   ??? Drug use: No   ??? Sexual activity: No     Other Topics Concern   ??? None     Social History Narrative       Home Medications:  Prior to Admission medications    Medication Sig Start Date End Date Taking? Authorizing Provider   amLODIPine (NORVASC) 10 mg tablet TAKE ONE TABLET BY MOUTH ONCE DAILY 07/01/16  Yes Jolson Dyann Kief, MD   rosuvastatin ([place]) 5  mg tablet Take 1 Tab by mouth nightly. 06/16/16  Yes Lake Bells, MD   metFORMIN ER (GLUCOPHAGE XR) 750 mg tablet TAKE 2 TABLETS EVERY DAY 06/07/16  Yes Lake Bells, MD   traZODone (DESYREL) 100 mg tablet Take 1 Tab by mouth nightly. 05/16/16  Yes Jolson Dyann Kief, MD   cloNIDine HCl (CATAPRES) 0.3 mg tablet TAKE ONE TABLET BY MOUTH TWICE DAILY 05/11/16  Yes Jolson Dyann Kief, MD   magnesium oxide (MAG-OX) 400 mg tablet TAKE 1 TABLET BY MOUTH DAILY 05/10/16  Yes Jolson Dyann Kief, MD   spironolactone (ALDACTONE) 100 mg tablet Take 1 Tab by mouth daily. 05/10/16  Yes Lake Bells, MD   QUEtiapine SR (SEROQUEL XR) 150 mg sr tablet TAKE 1 TABLET BY MOUTH NIGHTLY 02/25/16  Yes Jolson Dyann Kief, MD   losartan (COZAAR) 100 mg tablet Take 1 Tab by mouth daily. Indications: hypertension 02/10/16  Yes Lake Bells, MD   oxyCODONE-acetaminophen (PERCOCET 10) 10-325 mg per tablet Take 1 Tab by mouth every eight (8) hours as needed for Pain. Max Daily Amount: 3 Tabs. 01/26/16  Yes Jolson Dyann Kief, MD   albuterol (ACCUNEB) 0.63 mg/3 mL nebulizer solution 3 mL by Nebulization route every six (6) hours as needed for Wheezing. 01/19/16  Yes Wallene Huh, PA-C    ipratropium-albuterol (COMBIVENT RESPIMAT) 20-100 mcg/actuation inhaler Take 1 Puff by inhalation every four (4) hours as needed for Wheezing. 01/19/16  Yes Wallene Huh, PA-C   ibuprofen (MOTRIN) 600 mg tablet Take 1 Tab by mouth every eight (8) hours as needed for Pain. 12/18/15  Yes Otero Loffler, PA-C   gabapentin (NEURONTIN) 600 mg tablet TAKE 1 TABLET BY MOUTH THREE TIMES DAILY 11/02/15  Yes Lake Bells, MD   Lancets misc Check sugars twice daily 07/15/15  Yes Jolson Dyann Kief, MD   glucose blood VI test strips (ASCENSIA AUTODISC VI, ONE TOUCH ULTRA TEST VI) strip Check sugars twice daily 07/15/15  Yes Jolson Dyann Kief, MD   albuterol (PROVENTIL HFA, VENTOLIN HFA, PROAIR HFA) 90 mcg/actuation inhaler Take 2 Puffs by inhalation every six (6) hours as needed for Wheezing or Shortness of Breath. 06/16/15  Yes Rosine Marlynn Perking, FNP   Blood-Glucose Meter monitoring kit Check sugars 2 times per day, fasting and 2 hours after dinner 07/15/15   Lake Bells, MD       Allergies:  Allergies   Allergen Reactions   ??? Chocolate [Cocoa] Sneezing           Physical Exam:     Visit Vitals   ??? BP 121/89   ??? Pulse 100   ??? Temp 99.2 ??F (37.3 ??C)   ??? Resp 15   ??? Ht 5' 7"  (1.702 m)   ??? Wt 83 kg (183 lb)   ??? SpO2 95%   ??? BMI 28.66 kg/m2       Physical Exam:  General appearance: alert, cooperative   Head: Normocephalic   Neck: supple, trachea midline  Lungs: clear to auscultation bilaterally  Heart: regular rate and rhythm, S1, S2 normal   Abdomen: soft, non-tender. Bowel sounds normal.    Extremities: extremities no edema  Skin: Skin color, texture, turgor normal.    Neurologic: Grossly normal  PSY: mood and affect normal, appropriately behaved     Intake and Output:  Current Shift:     Last three shifts:       Lab/Data Reviewed:  BMP:   Lab Results  Component Value Date/Time    NA 138 07/23/2016 10:55 PM    K 3.6 07/23/2016 10:55 PM    CL 104 07/23/2016 10:55 PM    CO2 24 07/23/2016 10:55 PM    GLU 194 (H) 07/23/2016 10:55 PM     BUN 24 07/23/2016 10:55 PM    CREA 1.0 07/23/2016 10:55 PM    GFRAA >60.0 07/23/2016 10:55 PM    GFRNA 59 07/23/2016 10:55 PM     CMP:   Lab Results   Component Value Date/Time    NA 138 07/23/2016 10:55 PM    K 3.6 07/23/2016 10:55 PM    CL 104 07/23/2016 10:55 PM    CO2 24 07/23/2016 10:55 PM    GLU 194 (H) 07/23/2016 10:55 PM    BUN 24 07/23/2016 10:55 PM    CREA 1.0 07/23/2016 10:55 PM    GFRAA >60.0 07/23/2016 10:55 PM    GFRNA 59 07/23/2016 10:55 PM    CA 9.5 07/23/2016 10:55 PM    ALB 3.8 07/24/2016 01:14 AM    TP 8.8 (H) 07/24/2016 01:14 AM    SGOT 4 (L) 07/24/2016 01:14 AM    ALT 10 (L) 07/24/2016 01:14 AM     CBC:   Lab Results   Component Value Date/Time    WBC 25.9 (H) 07/23/2016 10:55 PM    HGB 14.9 07/23/2016 10:55 PM    HCT 42.9 07/23/2016 10:55 PM    PLT 369 07/23/2016 10:55 PM     All Cardiac Markers in the last 24 hours:   Lab Results   Component Value Date/Time    TROPT 0.00 07/23/2016 10:58 PM     Recent Glucose Results:   Lab Results   Component Value Date/Time    GLU 194 (H) 07/23/2016 10:55 PM     ABG: No results found for: PH, PHI, PCO2, PCO2I, PO2, PO2I, HCO3, HCO3I, FIO2, FIO2I  COAGS: No results found for: APTT, PTP, INR  Liver Panel:   Lab Results   Component Value Date/Time    ALB 3.8 07/24/2016 01:14 AM    CBIL 0.1 07/24/2016 01:14 AM    TP 8.8 (H) 07/24/2016 01:14 AM    SGOT 4 (L) 07/24/2016 01:14 AM    ALT 10 (L) 07/24/2016 01:14 AM    AP 78 07/24/2016 01:14 AM     Pancreatic Markers: No results found for: AMYLPOCT, AML, LIPPOCT, LPSE    Chest X-Ray is obtained; CXR reviewed independently      Illene Regulus, MD  July 24, 2016

## 2016-07-24 NOTE — Progress Notes (Signed)
[facility]: Vancomycin    Consult for Vancomycin Dosing by Pharmacy by Dr. Truddie CrumbleStambaugh  Consult provided for this 66 y.o. year old female , for indication of r/o sepsis.  Day of Therapy :new start    Ht Readings from Last 1 Encounters:   07/23/16 170.2 cm (67")        Wt Readings from Last 1 Encounters:   07/23/16 83 kg (183 lb)        Previous Regimen    Last Level    Other Current Antibiotics zosyn   Significant Cultures    Serum Creatinine Lab Results   Component Value Date/Time    Creatinine 1.0 07/23/2016 10:55 PM      Creatinine Clearance Estimated Creatinine Clearance: 63 mL/min (based on Cr of 1).   BUN Lab Results   Component Value Date/Time    BUN 24 07/23/2016 10:55 PM      WBC Lab Results   Component Value Date/Time    WBC 25.9 (H) 07/23/2016 10:55 PM      H/H Lab Results   Component Value Date/Time    HGB 14.9 07/23/2016 10:55 PM      Platelets Lab Results   Component Value Date/Time    PLATELET 369 07/23/2016 10:55 PM      Temp 98.3 ??F (36.8 ??C)     Start Vancomycin therapy, with loading dose of 1750 mg IV x 1.  Follow with maintenance dose of 1250 mg IV q12h.  Dose calculated to approximate a therapeutic trough of 15-20 mcg/mL.      Pharmacy to follow daily and will make changes to dose and/or frequency based on clinical status.    Pharmacist Sandie AnoKimberly Sherin, PHARMD

## 2016-07-24 NOTE — ED Notes (Signed)
TRANSFER - OUT REPORT:    Verbal report given to 5 east RN (name) on [name] [name]  being transferred to [facility] east (unit) for routine progression of care       Report consisted of patient???s Situation, Background, Assessment and   Recommendations(SBAR).     Information from the following report(s) SBAR was reviewed with the receiving nurse.    Lines:   Peripheral IV 07/24/16 Right Forearm (Active)        Opportunity for questions and clarification was provided.      Patient transported with:   [employer]

## 2016-07-24 NOTE — Progress Notes (Signed)
Hospitalist Progress Note    PCP:  Lake Bells, MD  Hospital Day:  0      Assessment/Plan:         Assessment and Plan:    Lactic acidosis r/o sepsis  Asthmatic exacerbation  Acute dehydration  Leukocytosis  DM2  HTN  Right hip pain   H/o hep C    DVT Prophylaxis:  lovenox    Check abg for acidemia, LDH, continue LA check every 6 hours, TSH, T4  Stop spironolactone and ARB, off metformin  contineu abx, f/u cx  Fluid resuscitation  F/u neck adenopathy when healthy  Continue steroids and nebs       Active Problems:    Lactic acidosis (07/24/2016)      Leukocytosis (07/24/2016)      Sepsis ([place]) (07/24/2016)      Asthma exacerbation (07/24/2016)          Current Facility-Administered Medications   Medication Dose Route Frequency   ??? albuterol (PROVENTIL VENTOLIN) nebulizer solution 2.5 mg  2.5 mg Nebulization Q2H PRN   ??? albuterol-ipratropium (DUO-NEB) 2.5 MG-0.5 MG/3 ML  3 mL Nebulization Q6H PRN   ??? melatonin tablet 3 mg  3 mg Oral QHS PRN   ??? acetaminophen (TYLENOL) tablet 650 mg  650 mg Oral Q6H PRN   ??? ondansetron (ZOFRAN) injection 4 mg  4 mg IntraVENous Q6H PRN   ??? promethazine (PHENERGAN) tablet 25 mg  25 mg Oral Q6H PRN   ??? benzonatate (TESSALON) capsule 100 mg  100 mg Oral TID PRN   ??? guaiFENesin (ROBITUSSIN) 100 mg/5 mL oral liquid 100 mg  100 mg Oral Q4H PRN   ??? amLODIPine (NORVASC) tablet 10 mg  10 mg Oral DAILY   ??? rosuvastatin (CRESTOR) tablet 5 mg  5 mg Oral QHS   ??? traZODone (DESYREL) tablet 100 mg  100 mg Oral QHS   ??? cloNIDine HCl (CATAPRES) tablet 0.2 mg  0.2 mg Oral BID   ??? QUEtiapine SR (SEROquel XR) tablet 150 mg  150 mg Oral QHS   ??? losartan (COZAAR) tablet 100 mg  100 mg Oral DAILY   ??? oxyCODONE-acetaminophen (PERCOCET 10) 10-325 mg per tablet 1 Tab  1 Tab Oral Q8H PRN   ??? ibuprofen (MOTRIN) tablet 600 mg  600 mg Oral Q6H PRN   ??? gabapentin (NEURONTIN) capsule 600 mg  600 mg Oral TID   ??? glucagon (GLUCAGEN) injection 1 mg  1 mg IntraMUSCular PRN    ??? dextrose (D50) infusion 10-15 g  20-30 mL IntraVENous PRN   ??? insulin lispro (HUMALOG) injection   SubCUTAneous AC&HS   ??? [START ON 07/25/2016] enoxaparin (LOVENOX) injection 40 mg  40 mg SubCUTAneous Q24H   ??? 0.9% sodium chloride infusion  150 mL/hr IntraVENous CONTINUOUS   ??? predniSONE (DELTASONE) tablet 40 mg  40 mg Oral DAILY WITH BREAKFAST   ??? vancomycin (VANCOCIN) 1250 mg in NS 250 ml infusion  1,250 mg IntraVENous Q12H   ??? [START ON 07/25/2016] Vancomycin Trough Level Due   Other ONCE   ??? *Vancomycin: Pharmacy to Dose (r/o sepsis)  1 Each Other Rx Dosing/Monitoring   ??? piperacillin-tazobactam (ZOSYN) 3.375 g in dextrose 5% 100 mL IVPB  3.375 g IntraVENous Q8H   ??? sodium chloride 0.9 % bolus infusion 1,000 mL  1,000 mL IntraVENous ONCE          Data Review    Recent Results (from the past 24 hour(s))   CBC WITH AUTOMATED DIFF  Collection Time: 07/23/16 10:55 PM   Result Value Ref Range    WBC 25.9 (H) 4.0 - 11.0 1000/mm3    RBC 5.07 3.60 - 5.20 M/uL    HGB 14.9 13.0 - 17.2 gm/dl    HCT 42.9 37.0 - 50.0 %    MCV 84.6 80.0 - 98.0 fL    MCH 29.4 25.4 - 34.6 pg    MCHC 34.7 30.0 - 36.0 gm/dl    PLATELET 369 140 - 450 1000/mm3    MPV 10.9 (H) 6.0 - 10.0 fL    RDW-SD 40.6 36.4 - 46.3      NRBC 0 0 - 0      IMMATURE GRANULOCYTES 0.5 0.0 - 3.0 %    NEUTROPHILS 78.2 (H) 34 - 64 %    LYMPHOCYTES 15.2 (L) 28 - 48 %    MONOCYTES 5.6 1 - 13 %    EOSINOPHILS 0.2 0 - 5 %    BASOPHILS 0.3 0 - 3 %   METABOLIC PANEL, BASIC    Collection Time: 07/23/16 10:55 PM   Result Value Ref Range    Sodium 138 136 - 145 mEq/L    Potassium 3.6 3.5 - 5.1 mEq/L    Chloride 104 98 - 107 mEq/L    CO2 24 21 - 32 mEq/L    Glucose 194 (H) 74 - 106 mg/dl    BUN 24 7 - 25 mg/dl    Creatinine 1.0 0.6 - 1.3 mg/dl    GFR est AA >60.0      GFR est non-AA 59      Calcium 9.5 8.5 - 10.1 mg/dl   POC TROPONIN-I    Collection Time: 07/23/16 10:58 PM   Result Value Ref Range    Troponin-I 0.00 0.00 - 0.07 ng/ml   URINALYSIS W/ RFLX MICROSCOPIC     Collection Time: 07/23/16 11:00 PM   Result Value Ref Range    Color PALE YELLOW (A) YELLOW,STRAW      Appearance CLEAR CLEAR      Glucose NEGATIVE NEGATIVE,Negative mg/dl    Bilirubin NEGATIVE NEGATIVE,Negative      Ketone NEGATIVE NEGATIVE,Negative mg/dl    Specific gravity <=1.005 1.005 - 1.030      Blood NEGATIVE NEGATIVE,Negative      pH (UA) 5.5 5.0 - 9.0      Protein 100 (A) NEGATIVE,Negative mg/dl    Urobilinogen 1.0 0.0 - 1.0 mg/dl    Nitrites NEGATIVE NEGATIVE,Negative      Leukocyte Esterase TRACE (A) NEGATIVE,Negative     POC URINE MICROSCOPIC    Collection Time: 07/23/16 11:00 PM   Result Value Ref Range    Epithelial cells, squamous OCCASIONAL /LPF    WBC 0-5 /HPF   D DIMER    Collection Time: 07/23/16 11:07 PM   Result Value Ref Range    D DIMER 0.70 (H) 0.01 - 0.50 ug/mL (FEU)   INFLUENZA A/B, H1N1 BY PCR    Collection Time: 07/23/16 11:39 PM   Result Value Ref Range    Influenza A PCR NEGATIVE NEGATIVE      Influenza B PCR NEGATIVE NEGATIVE     CULTURE, BLOOD    Collection Time: 07/24/16 12:55 AM   Result Value Ref Range    Blood Culture Result Culture In Progress, Daily Updates To Follow     PROCALCITONIN    Collection Time: 07/24/16 12:55 AM   Result Value Ref Range    PROCALCITONIN <0.05 0.00 - 0.50 ng/ml   CULTURE,  BLOOD    Collection Time: 07/24/16  1:14 AM   Result Value Ref Range    Blood Culture Result Culture In Progress, Daily Updates To Follow     LACTIC ACID    Collection Time: 07/24/16  1:14 AM   Result Value Ref Range    Lactic Acid 4.1 (HH) 0.4 - 2.0 mmol/L   HEPATIC FUNCTION PANEL    Collection Time: 07/24/16  1:14 AM   Result Value Ref Range    AST (SGOT) 4 (L) 15 - 37 U/L    ALT (SGPT) 10 (L) 12 - 78 U/L    Alk. phosphatase 78 45 - 117 U/L    Bilirubin, total 0.5 0.2 - 1.0 mg/dl    Protein, total 8.8 (H) 6.4 - 8.2 gm/dl    Albumin 3.8 3.4 - 5.0 gm/dl    Bilirubin, direct 0.1 0.0 - 0.2 mg/dl   GLUCOSE, POC    Collection Time: 07/24/16  3:35 AM   Result Value Ref Range     Glucose (POC) 243 (H) 65 - 105 mg/dL   LACTIC ACID    Collection Time: 07/24/16  5:23 AM   Result Value Ref Range    Lactic Acid 5.7 (HH) 0.4 - 2.0 mmol/L   GLUCOSE, POC    Collection Time: 07/24/16  7:49 AM   Result Value Ref Range    Glucose (POC) 206 (H) 65 - 105 mg/dL   LACTIC ACID    Collection Time: 07/24/16  8:03 AM   Result Value Ref Range    Lactic Acid 5.6 (HH) 0.4 - 2.0 mmol/L   GLUCOSE, POC    Collection Time: 07/24/16 12:20 PM   Result Value Ref Range    Glucose (POC) 340 (H) 65 - 105 mg/dL     Xr Chest Pa Lat    Result Date: 07/24/2016  Clinical history: Shortness of breath EXAMINATION: PA and lateral views of the chest 07/23/2016 Correlation: 07/21/2016 and prior CT scans. FINDINGS: Trachea and heart size are within normal limits. There are bilateral bullae. 1.7 cm left lower lobe nodule also demonstrated on CTA chest. There is hyperinflation. Right lung is clear.     IMPRESSION: 1. 1.7 cm left lower lobe nodule 2. Bilateral bullae.     Cta Chest W Or W Wo Cont    Result Date: 07/24/2016  Clinical history: Dyspnea EXAMINATION: CTA chest with contrast. 3 mm spiral scanning is performed from the lung apices to the upper poles of the kidneys. Coronal, sagittal and 3-D MIP sequences have been obtained. Correlation: 03/22/2016 and 07/03/2015 FINDINGS: Degenerative changes of the spine. Trachea, right and left mainstem bronchi are patent. Bilateral bullae. 3 mm left upper lobe nodules. 1.7 x 1.6 cm left lower lobe nodule (measuring 1.3 x 1.3 cm 07/03/2015). 2.5 cm right thyroid nodule. The esophagus is not dilated. No dissection thoracic aorta. Ascending aorta measures 4.4 cm. No lymph node enlargement in the axilla, mediastinum or hila. No pulmonary embolism. Visualized portions of the liver, gallbladder, spleen, pancreas, adrenal glands are unremarkable. Small right renal cyst.     IMPRESSION: 1. No pulmonary embolism. 2. 1.7 x 1.6 cm left lower lobe  nodule (prior measurement 1.3 x 1.6 cm 07/03/2015) 3. Stable 3 mm left upper lobe nodules. 4. Bilateral bullae. 5. 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        Subjective:   Daily Progress Note: 07/24/2016 12:28 PM    Feeling fair    Review of Systems- Positive Bolded-  FEVER  CHILLS  SOB  CP      Objective:     Visit Vitals   ??? BP 116/79 (BP 1 Location: Left arm, BP Patient Position: Supine)   ??? Pulse (!) 103   ??? Temp 98.8 ??F (37.1 ??C)   ??? Resp 18   ??? Ht 5' 7" (1.702 m)   ??? Wt 83 kg (183 lb)   ??? SpO2 94%   ??? BMI 28.66 kg/m2    O2 Flow Rate (L/min): 3 l/min O2 Device: Room air    Temp (24hrs), Avg:98.6 ??F (37 ??C), Min:98.2 ??F (36.8 ??C), Max:99.2 ??F (37.3 ??C)      02/11 0701 - 02/11 1900  In: 480 [P.O.:480]  Out: 300 [Urine:300]  02/09 1901 - 02/11 0700  In: 320 [P.O.:320]  Out: 200 [Urine:200]    GEN:  NAD, A & 0 X E  HEENT:  NO THYROMEGALY, MIDLINE TRACHEA  CV:  RRR, NO MURMURS, RUBS, GALLOPS  PULM:  Decreased breathsounds B  ABDO:  S/NT/ND   EXT:  LE  NTTP  PSYCH:  APPROPRIATE      Bunnie Domino, MD  July 24, 2016

## 2016-07-24 NOTE — Other (Signed)
Pt's lactic acid 5.7. Text paged hospitalist on call .awaiting for reply.

## 2016-07-24 NOTE — Other (Signed)
Bedside shift change report given to Arnel RN (oncoming nurse) by Maria Nena RN (offgoing nurse). Report included the following information SBAR and Kardex.

## 2016-07-24 NOTE — Other (Signed)
TRANSFER - IN REPORT:    Verbal report received from Evan Rn(name) on [name] [name]  being received from ER(unit) for routine progression of care      Report consisted of patient???s Situation, Background, Assessment and   Recommendations(SBAR).     Information from the following report(s) SBAR, Kardex, Intake/Output, MAR and Recent Results was reviewed with the receiving nurse.    Opportunity for questions and clarification was provided.      Assessment completed upon patient???s arrival to unit and care assumed.

## 2016-07-24 NOTE — Progress Notes (Signed)
Nutrition note:       Upon admission, pt was screened and identified by nursing staff as being at nutritional risk. Malnutrition screening tool received for: Unsure weight loss - patient states good appetite with no recent changes in appetite. Upon further review by the RD, there is no indication that this pt is at nutritional risk or requires further nutrition intervention at this time or indication for full nutrition assessment.     NUTRITION glycemic control evaluation    Education history: Patient states education by PCP- attended outpatient DM class     Current diet order: DIET REGULAR    Diet history: 3 meals/day - patient reports avoiding sugar sweetened beverages - but primarily "eats a regular diet" - requesting extra lunch from RD after consuming 100% of meal, reviewed consistent carbohydrate recommendations for DM     Compliance history:  [ ] Compliant   [X]  Inconsistent/limited compliance   [ ] Noncompliant    Biochemical data:  ?? Blood glucose at hospital: 154-340   HgbA1c:   Lab Results   Component Value Date/Time    Hemoglobin A1c 6.5 (H) 07/23/2016 02:57 AM    Hemoglobin A1c 6.8 (H) 06/10/2016 01:00 PM    Hemoglobin A1c 6.6 (H) 12/02/2015 12:15 PM    Hemoglobin A1c, External 6.6 01/08/2014   ??     Current DM medications: SSI, prednisone    Home DM medications: metformin     Education needs: Consistent Carbohydrate diet - eating well with DM  ______________________________________________________________________    Diet instructed on: .Consistent Carbohydrate diet - eating well with DM    Education modifications:  Visual aids - plate method     Barriers to learning/intervention: health literacy, minimal ability to teach back    Method of Instruction: verbal, written    Patient comprehension: fair    Expected compliance: fair      Gomez CleverlyKirby Elizabeth Moir, RD  07/24/16

## 2016-07-25 LAB — CULTURE, URINE
CULTURE RESULT: >100000 — AB
Culture result: >100000 — AB

## 2016-07-25 LAB — CBC WITH AUTOMATED DIFF
BASOPHILS: 0.2 % (ref 0–3)
EOSINOPHILS: 0 % (ref 0–5)
HCT: 38.7 % (ref 37.0–50.0)
HGB: 13.3 gm/dl (ref 13.0–17.2)
IMMATURE GRANULOCYTES: 0.4 % (ref 0.0–3.0)
LYMPHOCYTES: 15.2 % — ABNORMAL LOW (ref 28–48)
MCH: 29.7 pg (ref 25.4–34.6)
MCHC: 34.4 gm/dl (ref 30.0–36.0)
MCV: 86.4 fL (ref 80.0–98.0)
MONOCYTES: 6.8 % (ref 1–13)
MPV: 11.2 fL — ABNORMAL HIGH (ref 6.0–10.0)
NEUTROPHILS: 77.4 % — ABNORMAL HIGH (ref 34–64)
NRBC: 0 (ref 0–0)
PLATELET: 253 10*3/uL (ref 140–450)
RBC: 4.48 M/uL (ref 3.60–5.20)
RDW-SD: 42 (ref 36.4–46.3)
WBC: 12.2 10*3/uL — ABNORMAL HIGH (ref 4.0–11.0)

## 2016-07-25 LAB — METABOLIC PANEL, COMPREHENSIVE
ALT (SGPT): 11 U/L — ABNORMAL LOW (ref 12–78)
AST (SGOT): 4 U/L — ABNORMAL LOW (ref 15–37)
Albumin: 3.6 gm/dl (ref 3.4–5.0)
Alk. phosphatase: 65 U/L (ref 45–117)
BUN: 18 mg/dl (ref 7–25)
Bilirubin, total: 0.6 mg/dl (ref 0.2–1.0)
CO2: 24 mEq/L (ref 21–32)
Calcium: 9.1 mg/dl (ref 8.5–10.1)
Chloride: 108 mEq/L — ABNORMAL HIGH (ref 98–107)
Creatinine: 1 mg/dl (ref 0.6–1.3)
GFR est AA: >60
GFR est non-AA: 59
Glucose: 140 mg/dl — ABNORMAL HIGH (ref 74–106)
Potassium: 4 mEq/L (ref 3.5–5.1)
Protein, total: 8.4 gm/dl — ABNORMAL HIGH (ref 6.4–8.2)
Sodium: 139 mEq/L (ref 136–145)

## 2016-07-25 LAB — GLUCOSE, POC
Glucose (POC): 176 mg/dL — ABNORMAL HIGH (ref 65–105)
Glucose (POC): 142 mg/dL — ABNORMAL HIGH (ref 65–105)
Glucose (POC): 220 mg/dL — ABNORMAL HIGH (ref 65–105)
Glucose (POC): 211 mg/dL — ABNORMAL HIGH (ref 65–105)

## 2016-07-25 LAB — LACTIC ACID: Lactic Acid: 2.7 mmol/L — ABNORMAL HIGH (ref 0.4–2.0)

## 2016-07-25 LAB — VANCOMYCIN, TROUGH: Vancomycin,trough: 13.5 ug/mL — ABNORMAL LOW (ref 15.0–20.0)

## 2016-07-25 MED ORDER — ZOLPIDEM 5 MG TAB
5 mg | Freq: Every evening | ORAL | Status: DC | PRN
Start: 2016-07-25 — End: 2016-07-27
  Administered 2016-07-25 – 2016-07-27 (×3): via ORAL

## 2016-07-25 MED ORDER — CLONIDINE 0.2 MG TAB
0.2 mg | Freq: Two times a day (BID) | ORAL | Status: DC
Start: 2016-07-25 — End: 2016-07-27
  Administered 2016-07-25 – 2016-07-27 (×5): via ORAL

## 2016-07-25 MED ORDER — PHARMACY VANCOMYCIN NOTE
Freq: Once | Status: AC
Start: 2016-07-25 — End: 2016-07-26
  Administered 2016-07-25: 21:00:00

## 2016-07-25 MED ORDER — CLONIDINE 0.1 MG TAB
0.1 mg | Freq: Once | ORAL | Status: AC
Start: 2016-07-25 — End: 2016-07-25

## 2016-07-25 MED ORDER — CLONIDINE 0.1 MG TAB
0.1 mg | Freq: Two times a day (BID) | ORAL | Status: DC
Start: 2016-07-25 — End: 2016-07-25

## 2016-07-25 MED ORDER — PIPERACILLIN-TAZOBACTAM 3.375 GRAM IV SOLR
3.375 gram | Freq: Three times a day (TID) | INTRAVENOUS | Status: DC
Start: 2016-07-25 — End: 2016-07-27
  Administered 2016-07-26 – 2016-07-27 (×6): via INTRAVENOUS

## 2016-07-25 MED FILL — VANCOMYCIN IN 0.9 % SODIUM CHLORIDE 1.25 GRAM/250 ML IV: 1.25 gram/250 mL | INTRAVENOUS | Qty: 250

## 2016-07-25 MED FILL — CLONIDINE 0.2 MG TAB: 0.2 mg | ORAL | Qty: 1

## 2016-07-25 MED FILL — PIPERACILLIN-TAZOBACTAM 3.375 GRAM IV SOLR: 3.375 gram | INTRAVENOUS | Qty: 3.38

## 2016-07-25 MED FILL — CLONIDINE 0.1 MG TAB: 0.1 mg | ORAL | Qty: 1

## 2016-07-25 MED FILL — GABAPENTIN 100 MG CAP: 100 mg | ORAL | Qty: 6

## 2016-07-25 MED FILL — IBUPROFEN 600 MG TAB: 600 mg | ORAL | Qty: 1

## 2016-07-25 MED FILL — ZOSYN 40.5 GRAM INTRAVENOUS SOLUTION: 40.5 gram | INTRAVENOUS | Qty: 40.5

## 2016-07-25 MED FILL — PREDNISONE 20 MG TAB: 20 mg | ORAL | Qty: 2

## 2016-07-25 MED FILL — LOVENOX 40 MG/0.4 ML SUBCUTANEOUS SYRINGE: 40 mg/0.4 mL | SUBCUTANEOUS | Qty: 0.4

## 2016-07-25 MED FILL — AMLODIPINE 5 MG TAB: 5 mg | ORAL | Qty: 2

## 2016-07-25 MED FILL — GABAPENTIN 300 MG CAP: 300 mg | ORAL | Qty: 2

## 2016-07-25 MED FILL — OXYCODONE-ACETAMINOPHEN 10 MG-325 MG TAB: 10-325 mg | ORAL | Qty: 1

## 2016-07-25 MED FILL — TRAZODONE 100 MG TAB: 100 mg | ORAL | Qty: 1

## 2016-07-25 MED FILL — SEROQUEL XR 50 MG TABLET,EXTENDED RELEASE: 50 mg | ORAL | Qty: 3

## 2016-07-25 MED FILL — PHARMACY VANCOMYCIN NOTE: Qty: 1

## 2016-07-25 MED FILL — ROSUVASTATIN 5 MG TAB: 5 mg | ORAL | Qty: 1

## 2016-07-25 MED FILL — INSULIN LISPRO 100 UNIT/ML INJECTION: 100 unit/mL | SUBCUTANEOUS | Qty: 100

## 2016-07-25 MED FILL — MELATONIN 3 MG TAB: 3 mg | ORAL | Qty: 1

## 2016-07-25 MED FILL — ZOLPIDEM 5 MG TAB: 5 mg | ORAL | Qty: 1

## 2016-07-25 NOTE — Progress Notes (Signed)
[facility] D[facility]: Vancomycin  Trough level = 13.5 today, goal 15-20 for r/o sepsis.  Will increase dose from 1250mg  to 1500mg  q12h and will follow.

## 2016-07-25 NOTE — Progress Notes (Signed)
Problem: Falls - Risk of  Goal: *Absence of Falls  Document Schmid Fall Risk and appropriate interventions in the flowsheet.   Outcome: Progressing Towards Goal  Fall Risk Interventions:            Medication Interventions: Bed/chair exit alarm, Patient to call before getting OOB, Teach patient to arise slowly    Elimination Interventions: Bed/chair exit alarm, Call light in reach, Patient to call for help with toileting needs

## 2016-07-25 NOTE — Progress Notes (Signed)
Problem: Pain  Goal: *Control of Pain  Outcome: Progressing Towards Goal  Pt educated on the pain scale as appropriate to pt's cognitive level and use of scheduled and/or PRN pain meds;

## 2016-07-25 NOTE — Progress Notes (Signed)
INTERNAL MEDICINE                                                                   Daily Progress Note    Patient:  [name] [name]  Today date: July 25, 2016  Date of Admission:  07/23/2016    Interval History and Events of the last 24 hours:  Still SOB    Assessment/Plan:       Patient seen in follow up for multiple medical problems as listed below :  Asthma exacerbation  O2 supplementation  Bronchodilators, steroids    Pulm nodules  CT: 1.7 x 1.6 cm left lower lobe nodule (measuring 1.3 x 1.3 cm 07/03/2015).  PCCM consult      Lactic acidosis possible from sepsis or metformin  Rule out sepsis  Hold Metformin  Trend level  Abx for  Now  F/U culture    Leukocytosis like related to steroid  Monitor CBC  Culture pending    DM type II  Insulin Sliding Scale  Monitor FS    HTN  On Norvasc, Clonidine  Monitor BP closely      DVT Prophylaxis:    Code Status:  .Prior     Disposition and Family:   Recommend to continue hospitalization. Discussed with patient and nursing staff.    Anticipated Date of Discharge: TBD  Anticipated Disposition (home, SNF) : TBD    Labwork and Ancillary Studies     All labs/tests/imaging reviewed.Spoke with the nurse regarding patient issues.    Labwork:    CBC w/Diff   Lab Results   Component Value Date/Time    WBC 12.2 (H) 07/25/2016 07:15 AM    HGB 13.3 07/25/2016 07:15 AM    HCT 38.7 07/25/2016 07:15 AM    PLATELET 253 07/25/2016 07:15 AM    MCV 86.4 07/25/2016 07:15 AM        Basic Metabolic Profile   Lab Results   Component Value Date/Time    Sodium 139 07/25/2016 07:15 AM    Potassium 4.0 07/25/2016 07:15 AM    Chloride 108 (H) 07/25/2016 07:15 AM    CO2 24 07/25/2016 07:15 AM    Anion gap 9 06/10/2016 01:00 PM    Glucose 140 (H) 07/25/2016 07:15 AM    BUN 18 07/25/2016 07:15 AM    Creatinine 1.0 07/25/2016 07:15 AM    BUN/Creatinine ratio 24 (H) 06/10/2016 01:00 PM    GFR est AA >60.0 07/25/2016 07:15 AM     GFR est non-AA 59 07/25/2016 07:15 AM    Calcium 9.1 07/25/2016 07:15 AM        Cardiac Enzymes   Lab Results   Component Value Date/Time    Troponin-I 0.00 07/23/2016 10:58 PM      Arterial Blood Gases   No results found for: PH, PHI, PCO2, PCO2I, PO2, PO2I, HCO3, HCO3I, FIO2, FIO2I   Coagulation    No results found for: APTT, PTP, INR   Hepatic Function   Lab Results   Component Value Date/Time    AST (SGOT) 4 (L) 07/25/2016 07:15 AM    Alk. phosphatase 65 07/25/2016 07:15 AM    Bilirubin, direct 0.1 07/24/2016 01:14 AM            XR Results (  most recent):    Results from Hospital Encounter encounter on 07/23/16   XR CHEST PA LAT   Narrative Clinical history: Shortness of breath    EXAMINATION: PA and lateral views of the chest 07/23/2016    Correlation: 07/21/2016 and prior CT scans.    FINDINGS:  Trachea and heart size are within normal limits. There are bilateral bullae. 1.7  cm left lower lobe nodule also demonstrated on CTA chest. There is  hyperinflation. Right lung is clear.         Impression IMPRESSION:  1. 1.7 cm left lower lobe nodule  2. Bilateral bullae.          MRI Results (most recent):    Results from [place] encounter on 04/07/15   MRI BRAIN W WO CONT   Narrative CLINICAL HISTORY:  Abnormal CT, meningioma    PROCEDURE: MRI brain without and with contrast    COMPARISON: Head CT dated 04/08/2015    FINDINGS:    There is age-related cerebral atrophy. There is extensive chronic small vessel  ischemic change, advanced for patient age. As noted on CT, there is a enhancing  extra-axial mass in the left CP angle along the petrous temporal bone measuring  10 mm. This is observed on image 9 series 12, image 6 series 11 and image 21  series 13. There are dural tails extending to the tentorium cerebelli. Imaging  findings would favor meningioma. No other areas of abnormal brain parenchymal  enhancement. The sella, pineal region, craniocervical junction are unremarkable.   Skull base flow voids are patent.         Impression IMPRESSION:  1. Enhancing mass in the left posterior fossa adjacent to the petrous temporal  bone as described above. Main consideration would include meningioma. Other less  likely differential considerations would include dural metastasis and lymphoma.  Consider surveillance or further evaluation.    2.  Chronic small vessel ischemic changes, advanced for patient age.          CT Results (most recent):    Results from Hospital Encounter encounter on 07/23/16   CTA CHEST W OR W WO CONT   Narrative Clinical history: Dyspnea    EXAMINATION:   CTA chest with contrast. 3 mm spiral scanning is performed from the lung apices  to the upper poles of the kidneys. Coronal, sagittal and 3-D MIP sequences have  been obtained.    Correlation: 03/22/2016 and 07/03/2015    FINDINGS:  Degenerative changes of the spine. Trachea, right and left mainstem bronchi are  patent. Bilateral bullae. 3 mm left upper lobe nodules. 1.7 x 1.6 cm left lower  lobe nodule (measuring 1.3 x 1.3 cm 07/03/2015).    2.5 cm right thyroid nodule. The esophagus is not dilated. No dissection  thoracic aorta. Ascending aorta measures 4.4 cm. No lymph node enlargement in  the axilla, mediastinum or hila.    No pulmonary embolism. Visualized portions of the liver, gallbladder, spleen,  pancreas, adrenal glands are unremarkable. Small right renal cyst.         Impression IMPRESSION:  1. No pulmonary embolism.  2. 1.7 x 1.6 cm left lower lobe nodule (prior measurement 1.3 x 1.6 cm  07/03/2015)  3. Stable 3 mm left upper lobe nodules.  4. Bilateral bullae.  5. 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            Current Inpatient Meds and Allergies     Medications:  Current Facility-Administered Medications  Medication Dose Route Frequency   ??? zolpidem (AMBIEN) tablet 5 mg  5 mg Oral QHS PRN   ??? cloNIDine HCl (CATAPRES) tablet 0.2 mg  0.2 mg Oral BID    ??? piperacillin-tazobactam (ZOSYN) 3.375 g in 0.9% sodium chloride (MBP/ADV) 100 mL MBP  3.375 g IntraVENous Q8H   ??? albuterol (PROVENTIL VENTOLIN) nebulizer solution 2.5 mg  2.5 mg Nebulization Q2H PRN   ??? albuterol-ipratropium (DUO-NEB) 2.5 MG-0.5 MG/3 ML  3 mL Nebulization Q6H PRN   ??? melatonin tablet 3 mg  3 mg Oral QHS PRN   ??? acetaminophen (TYLENOL) tablet 650 mg  650 mg Oral Q6H PRN   ??? ondansetron (ZOFRAN) injection 4 mg  4 mg IntraVENous Q6H PRN   ??? promethazine (PHENERGAN) tablet 25 mg  25 mg Oral Q6H PRN   ??? benzonatate (TESSALON) capsule 100 mg  100 mg Oral TID PRN   ??? guaiFENesin (ROBITUSSIN) 100 mg/5 mL oral liquid 100 mg  100 mg Oral Q4H PRN   ??? amLODIPine (NORVASC) tablet 10 mg  10 mg Oral DAILY   ??? rosuvastatin (CRESTOR) tablet 5 mg  5 mg Oral QHS   ??? traZODone (DESYREL) tablet 100 mg  100 mg Oral QHS   ??? QUEtiapine SR (SEROquel XR) tablet 150 mg  150 mg Oral QHS   ??? oxyCODONE-acetaminophen (PERCOCET 10) 10-325 mg per tablet 1 Tab  1 Tab Oral Q8H PRN   ??? ibuprofen (MOTRIN) tablet 600 mg  600 mg Oral Q6H PRN   ??? gabapentin (NEURONTIN) capsule 600 mg  600 mg Oral TID   ??? glucagon (GLUCAGEN) injection 1 mg  1 mg IntraMUSCular PRN   ??? dextrose (D50) infusion 10-15 g  20-30 mL IntraVENous PRN   ??? insulin lispro (HUMALOG) injection   SubCUTAneous AC&HS   ??? enoxaparin (LOVENOX) injection 40 mg  40 mg SubCUTAneous Q24H   ??? predniSONE (DELTASONE) tablet 40 mg  40 mg Oral DAILY WITH BREAKFAST   ??? vancomycin (VANCOCIN) 1250 mg in NS 250 ml infusion  1,250 mg IntraVENous Q12H   ??? Vancomycin Trough Level Due   Other ONCE   ??? *Vancomycin: Pharmacy to Dose (r/o sepsis)  1 Each Other Rx Dosing/Monitoring   ??? piperacillin-tazobactam (ZOSYN) 3.375 g in dextrose 5% 100 mL IVPB  3.375 g IntraVENous Q8H          Objective:       Visit Vitals   ??? BP 109/77 (BP 1 Location: Right arm, BP Patient Position: Supine)   ??? Pulse 92   ??? Temp 98.8 ??F (37.1 ??C)   ??? Resp 18   ??? Ht 5' 7"  (1.702 m)   ??? Wt 83 kg (183 lb)   ??? SpO2 99%    ??? BMI 28.66 kg/m2     Body mass index is 28.66 kg/(m^2).      Intake/Output Summary (Last 24 hours) at 07/25/16 1402  Last data filed at 07/25/16 1337   Gross per 24 hour   Intake           2182.5 ml   Output             1415 ml   Net            767.5 ml       General: ?? Alert, cooperative, no distress, appears stated age.??   Lungs:?? ?? Expiratory  wheeze   Chest wall: ?? No tenderness or deformity.??   Heart: ?? Regular rate and rhythm, S1, S2 normal,  no murmur, click, rub or gallop.??   Abdomen:?? ?? Soft, non-tender. Bowel sounds normal. No masses,?? No organomegaly.??       Musculoskeleta : Normal range of motion in most of the joints   Extremities:?? Extremities normal, atraumatic, no cyanosis or edema.??   Pulses:?? 2+ and symmetric all extremities.??   Skin:?? Skin color, texture, turgor normal. No rashes or lesions??   Neurologic  Psych:?? : CNII-XII intact.    Normal affect and mood . No thoughts of harm to self or others??             Total time spent with chart review, patient examination/education, discussion with staff on case,documentation and medication management / adjustment:   >35 minutes    It is always a pleasure to be involved in the clinical care of this patient.    Otila Kluver, MD  July 25, 2016  [facility]  Pager:  [phone]

## 2016-07-25 NOTE — Progress Notes (Signed)
Pt still c/o headache after tylenol and motrin PO. BP 139/97. Pt requested [place]. Paged [facility]. Dr. Wenda LowMatriano ordered  5 mg ambien PRN bedtime and 0.1 mg clonidine PO once.

## 2016-07-25 NOTE — Consults (Signed)
[facility]       Pulmonary Consult Note    Patient: [name] [name]               Sex: female          DOA: 07/23/2016       Date of Birth:  1950-12-25      Age:  66 y.o.        LOS:  LOS: 1 day        Reason for Consult: LLL nodule with interval increase in size     IMPRESSION:   ?? Recent CTA chest demonstrated interval increase in left lower lobe nodule currently measuring 1.7 cm x 1.6 cm (measuring   1.3 cm x 1.3 cm 07/03/15)  ?? Stable other multiple pulmonary nodules   ?? Bilateral bullous lung disease   ?? Reason presentation consistent with asthmatics/COPD exacerbation    ?? RECOMMENDATIONS:   ?? 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 generally this tumor do not need any intervention until and unless patient experience any symptoms.  ?? Please consider follow-up CT scan in 6 months time to monitor size of the hamartoma and other pulmonary nodules.  ?? Continue prednisone for asthma/COPD exacerbation   ?? Continue current antibiosis. If patient remains stable than consider rapid de-escalation   ?? Supplemental O2 via NC, titrate flow for goal SPO2> 90%  ?? SQ LOVENOX for DVT prophylaxis   ?? Will defer respective systems problem management to primary and other consultant and follow patient with primary and other team  ?? Further recommendations will be based on the patient's response to recommended treatment and results of the investigation ordered.     HPI:    Ms. [name] [name] is a 66 y.o. female who  carries known history of asthma and bullous lung disease with multiple pulmonary nodules largest one measuring around 1.3 cm round for 3 cm located in the left lower lobe area related for interval increase in size of left lower lobe nodule measuring around 1.6 cm x 1.7 cm. Patient initially presented with asthma  exacerbation with current treatment and reports interval improvement in breathing. During hospitalization patient had CTA chest that demonstrated interval increase in size of left lower lobe nodule.  Per patient, she has had biopsy of left lower lobe nodule in January 2017 that demonstrated finding consistent with benign tumor. I personally reviewed biopsy report that is consistent with pulmonary hamartoma i.e. benign tumor.      Review of Systems  Review of Systems   Constitutional: Negative.    HENT: Negative.    Eyes: Negative.    Respiratory: Positive for wheezing.    Gastrointestinal: Negative.    Genitourinary: Negative.    Musculoskeletal: Negative.    Skin: Negative.    Neurological: Negative.    Endo/Heme/Allergies: Negative.    Psychiatric/Behavioral: Negative.          Past Medical History  Past Medical History:   Diagnosis Date   ??? Arthritis    ??? Chronic pain    ??? Diabetes ([place])    ??? GERD (gastroesophageal reflux disease)    ??? Hypertension    ??? Liver disease     HEP C   ??? PTSD (post-traumatic stress disorder)     lived through the [place] in 1993       Past Surgical  History  Past Surgical History:   Procedure Laterality Date   ??? HX ENDOSCOPY     ??? HX GYN  2010    hysterectomy   ??? HX HIP REPLACEMENT Right 01/29/2015   ??? HX HIP REPLACEMENT         Family History  Family History   Problem Relation Age of Onset   ??? Hypertension Mother    ??? Cancer Maternal Aunt    ??? Alcohol abuse Maternal Grandfather        Social History  Social History   Substance Use Topics   ??? Smoking status: Never Smoker   ??? Smokeless tobacco: Never Used   ??? Alcohol use No        Medications  Current Facility-Administered Medications   Medication Dose Route Frequency   ??? zolpidem (AMBIEN) tablet 5 mg  5 mg Oral QHS PRN   ??? cloNIDine HCl (CATAPRES) tablet 0.2 mg  0.2 mg Oral BID   ??? piperacillin-tazobactam (ZOSYN) 3.375 g in 0.9% sodium chloride (MBP/ADV) 100 mL MBP  3.375 g IntraVENous Q8H    ??? Vancomycin trough on 2/12 @ 1600   Other ONCE   ??? albuterol (PROVENTIL VENTOLIN) nebulizer solution 2.5 mg  2.5 mg Nebulization Q2H PRN   ??? albuterol-ipratropium (DUO-NEB) 2.5 MG-0.5 MG/3 ML  3 mL Nebulization Q6H PRN   ??? melatonin tablet 3 mg  3 mg Oral QHS PRN   ??? acetaminophen (TYLENOL) tablet 650 mg  650 mg Oral Q6H PRN   ??? ondansetron (ZOFRAN) injection 4 mg  4 mg IntraVENous Q6H PRN   ??? promethazine (PHENERGAN) tablet 25 mg  25 mg Oral Q6H PRN   ??? benzonatate (TESSALON) capsule 100 mg  100 mg Oral TID PRN   ??? guaiFENesin (ROBITUSSIN) 100 mg/5 mL oral liquid 100 mg  100 mg Oral Q4H PRN   ??? amLODIPine (NORVASC) tablet 10 mg  10 mg Oral DAILY   ??? rosuvastatin (CRESTOR) tablet 5 mg  5 mg Oral QHS   ??? traZODone (DESYREL) tablet 100 mg  100 mg Oral QHS   ??? QUEtiapine SR (SEROquel XR) tablet 150 mg  150 mg Oral QHS   ??? oxyCODONE-acetaminophen (PERCOCET 10) 10-325 mg per tablet 1 Tab  1 Tab Oral Q8H PRN   ??? ibuprofen (MOTRIN) tablet 600 mg  600 mg Oral Q6H PRN   ??? gabapentin (NEURONTIN) capsule 600 mg  600 mg Oral TID   ??? glucagon (GLUCAGEN) injection 1 mg  1 mg IntraMUSCular PRN   ??? dextrose (D50) infusion 10-15 g  20-30 mL IntraVENous PRN   ??? insulin lispro (HUMALOG) injection   SubCUTAneous AC&HS   ??? enoxaparin (LOVENOX) injection 40 mg  40 mg SubCUTAneous Q24H   ??? predniSONE (DELTASONE) tablet 40 mg  40 mg Oral DAILY WITH BREAKFAST   ??? vancomycin (VANCOCIN) 1250 mg in NS 250 ml infusion  1,250 mg IntraVENous Q12H   ??? *Vancomycin: Pharmacy to Dose (r/o sepsis)  1 Each Other Rx Dosing/Monitoring   ??? piperacillin-tazobactam (ZOSYN) 3.375 g in dextrose 5% 100 mL IVPB  3.375 g IntraVENous Q8H     Prior to Admission medications    Medication Sig Start Date End Date Taking? Authorizing Provider   amLODIPine (NORVASC) 10 mg tablet TAKE ONE TABLET BY MOUTH ONCE DAILY 07/01/16  Yes Jolson Dyann Kief, MD   rosuvastatin (CRESTOR) 5 mg tablet Take 1 Tab by mouth nightly. 06/16/16  Yes Lake Bells, MD    metFORMIN ER (GLUCOPHAGE XR) 750  mg tablet TAKE 2 TABLETS EVERY DAY 06/07/16  Yes Lake Bells, MD   traZODone (DESYREL) 100 mg tablet Take 1 Tab by mouth nightly. 05/16/16  Yes Jolson Dyann Kief, MD   cloNIDine HCl (CATAPRES) 0.3 mg tablet TAKE ONE TABLET BY MOUTH TWICE DAILY 05/11/16  Yes Jolson Dyann Kief, MD   spironolactone (ALDACTONE) 100 mg tablet Take 1 Tab by mouth daily. 05/10/16  Yes Lake Bells, MD   losartan (COZAAR) 100 mg tablet Take 1 Tab by mouth daily. Indications: hypertension 02/10/16  Yes Jolson Dyann Kief, MD   albuterol (ACCUNEB) 0.63 mg/3 mL nebulizer solution 3 mL by Nebulization route every six (6) hours as needed for Wheezing. 01/19/16  Yes Wallene Huh, PA-C   ipratropium-albuterol (COMBIVENT RESPIMAT) 20-100 mcg/actuation inhaler Take 1 Puff by inhalation every four (4) hours as needed for Wheezing. 01/19/16  Yes Wallene Huh, PA-C   gabapentin (NEURONTIN) 600 mg tablet TAKE 1 TABLET BY MOUTH THREE TIMES DAILY 11/02/15  Yes Lake Bells, MD   Blood-Glucose Meter monitoring kit Check sugars 2 times per day, fasting and 2 hours after dinner 07/15/15  Yes Lake Bells, MD   Lancets misc Check sugars twice daily 07/15/15  Yes Jolson Dyann Kief, MD   glucose blood VI test strips (ASCENSIA AUTODISC VI, ONE TOUCH ULTRA TEST VI) strip Check sugars twice daily 07/15/15  Yes Jolson Dyann Kief, MD   magnesium oxide (MAG-OX) 400 mg tablet TAKE 1 TABLET BY MOUTH DAILY 05/10/16   Lake Bells, MD   QUEtiapine SR (SEROQUEL XR) 150 mg sr tablet TAKE 1 TABLET BY MOUTH NIGHTLY 02/25/16   Lake Bells, MD   oxyCODONE-acetaminophen (PERCOCET 10) 10-325 mg per tablet Take 1 Tab by mouth every eight (8) hours as needed for Pain. Max Daily Amount: 3 Tabs. 01/26/16   Jolson Dyann Kief, MD   ibuprofen (MOTRIN) 600 mg tablet Take 1 Tab by mouth every eight (8) hours as needed for Pain. 12/18/15   [place]un[place] Loffler, PA-C   albuterol (PROVENTIL HFA, VENTOLIN HFA, PROAIR HFA) 90 mcg/actuation  inhaler Take 2 Puffs by inhalation every six (6) hours as needed for Wheezing or Shortness of Breath. 06/16/15   Rosine Marlynn Perking, FNP       Allergy  Allergies   Allergen Reactions   ??? Chocolate [Cocoa] Sneezing       Physical Exam:   Vital Signs:    Blood pressure 109/77, pulse 92, temperature 98.8 ??F (37.1 ??C), resp. rate 18, height 5' 7"  (1.702 m), weight 83 kg (183 lb), SpO2 99 %. Body mass index is 28.66 kg/(m^2).    O2 Device: Room air   O2 Flow Rate (L/min): 3 l/min   Temp (24hrs), Avg:98.6 ??F (37 ??C), Min:98.3 ??F (36.8 ??C), Max:99.2 ??F (37.3 ??C)       Intake/Output:   Last shift:      02/12 0701 - 02/12 1900  In: 580 [P.O.:480; I.V.:100]  Out: 450 [Urine:450]  Last 3 shifts: 02/10 1901 - 02/12 0700  In: 3602.5 [P.O.:2100; I.V.:1502.5]  Out: 2385 [Urine:2385]    Intake/Output Summary (Last 24 hours) at 07/25/16 1430  Last data filed at 07/25/16 1416   Gross per 24 hour   Intake           2182.5 ml   Output             1615 ml   Net            567.5  ml      Physical Exam   Constitutional: She is oriented to person, place, and time. She appears well-developed and well-nourished.   HENT:   Head: Normocephalic and atraumatic.   Eyes: EOM are normal. Pupils are equal, round, and reactive to light.   Neck: Normal range of motion. Neck supple.   Cardiovascular: Regular rhythm.    Pulmonary/Chest: She has no wheezes.   Moderate air entry on both side   Abdominal: Soft. Bowel sounds are normal.   Musculoskeletal: She exhibits no edema.   Neurological: She is alert and oriented to person, place, and time.   Skin: Skin is warm and dry.   Psychiatric: She has a normal mood and affect.   Nursing note and vitals reviewed.          Labs Reviewed:  Recent Results (from the past 24 hour(s))   GLUCOSE, POC    Collection Time: 07/24/16  5:09 PM   Result Value Ref Range    Glucose (POC) 256 (H) 65 - 105 mg/dL   GLUCOSE, POC    Collection Time: 07/24/16  9:14 PM   Result Value Ref Range    Glucose (POC) 176 (H) 65 - 105 mg/dL    CBC WITH AUTOMATED DIFF    Collection Time: 07/25/16  7:15 AM   Result Value Ref Range    WBC 12.2 (H) 4.0 - 11.0 1000/mm3    RBC 4.48 3.60 - 5.20 M/uL    HGB 13.3 13.0 - 17.2 gm/dl    HCT 38.7 37.0 - 50.0 %    MCV 86.4 80.0 - 98.0 fL    MCH 29.7 25.4 - 34.6 pg    MCHC 34.4 30.0 - 36.0 gm/dl    PLATELET 253 140 - 450 1000/mm3    MPV 11.2 (H) 6.0 - 10.0 fL    RDW-SD 42.0 36.4 - 46.3      NRBC 0 0 - 0      IMMATURE GRANULOCYTES 0.4 0.0 - 3.0 %    NEUTROPHILS 77.4 (H) 34 - 64 %    LYMPHOCYTES 15.2 (L) 28 - 48 %    MONOCYTES 6.8 1 - 13 %    EOSINOPHILS 0.0 0 - 5 %    BASOPHILS 0.2 0 - 3 %   METABOLIC PANEL, COMPREHENSIVE    Collection Time: 07/25/16  7:15 AM   Result Value Ref Range    Sodium 139 136 - 145 mEq/L    Potassium 4.0 3.5 - 5.1 mEq/L    Chloride 108 (H) 98 - 107 mEq/L    CO2 24 21 - 32 mEq/L    Glucose 140 (H) 74 - 106 mg/dl    BUN 18 7 - 25 mg/dl    Creatinine 1.0 0.6 - 1.3 mg/dl    GFR est AA >60.0      GFR est non-AA 59      Calcium 9.1 8.5 - 10.1 mg/dl    AST (SGOT) 4 (L) 15 - 37 U/L    ALT (SGPT) 11 (L) 12 - 78 U/L    Alk. phosphatase 65 45 - 117 U/L    Bilirubin, total 0.6 0.2 - 1.0 mg/dl    Protein, total 8.4 (H) 6.4 - 8.2 gm/dl    Albumin 3.6 3.4 - 5.0 gm/dl   GLUCOSE, POC    Collection Time: 07/25/16  8:14 AM   Result Value Ref Range    Glucose (POC) 142 (H) 65 - 105 mg/dL   GLUCOSE, POC  Collection Time: 07/25/16 11:57 AM   Result Value Ref Range    Glucose (POC) 220 (H) 65 - 105 mg/dL           No results for input(s): FIO2I, IFO2, HCO3I, IHCO3, HCOPOC, PCO2I, PCOPOC, IPHI, PHI, PHPOC, PO2I, PO2POC in the last 72 hours.    No lab exists for component: IPOC2    All Micro Results     Procedure Component Value Units Date/Time    CULTURE, URINE [[id number]]  (Abnormal) Collected:  07/24/16 0130    Order Status:  Completed Specimen:  Urine from Urine Updated:  07/25/16 0748     Culture result -- (A)        >100,000 CFU/mL  Skin and/or genital contamination       CULTURE, BLOOD [[id number]] Collected:  07/24/16 0055    Order Status:  Completed Specimen:  Blood from Blood Updated:  07/24/16 0747     Blood Culture Result         Culture In Progress, Daily Updates To Follow    CULTURE, BLOOD [[id number]] Collected:  07/24/16 0114    Order Status:  Completed Specimen:  Blood from Blood Updated:  07/24/16 0747     Blood Culture Result         Culture In Progress, Daily Updates To Follow              Imaging:  I have personally reviewed the patient???s chest radiographs images and report with the patient    Results from Hospital Encounter encounter on 07/23/16   XR CHEST PA LAT   Narrative Clinical history: Shortness of breath    EXAMINATION: PA and lateral views of the chest 07/23/2016    Correlation: 07/21/2016 and prior CT scans.    FINDINGS:  Trachea and heart size are within normal limits. There are bilateral bullae. 1.7  cm left lower lobe nodule also demonstrated on CTA chest. There is  hyperinflation. Right lung is clear.         Impression IMPRESSION:  1. 1.7 cm left lower lobe nodule  2. Bilateral bullae.            Results from [facility] encounter on 07/23/16   CTA CHEST W OR W WO CONT   Narrative Clinical history: Dyspnea    EXAMINATION:   CTA chest with contrast. 3 mm spiral scanning is performed from the lung apices  to the upper poles of the kidneys. Coronal, sagittal and 3-D MIP sequences have  been obtained.    Correlation: 03/22/2016 and 07/03/2015    FINDINGS:  Degenerative changes of the spine. Trachea, right and left mainstem bronchi are  patent. Bilateral bullae. 3 mm left upper lobe nodules. 1.7 x 1.6 cm left lower  lobe nodule (measuring 1.3 x 1.3 cm 07/03/2015).    2.5 cm right thyroid nodule. The esophagus is not dilated. No dissection  thoracic aorta. Ascending aorta measures 4.4 cm. No lymph node enlargement in  the axilla, mediastinum or hila.    No pulmonary embolism. Visualized portions of the liver, gallbladder, spleen,   pancreas, adrenal glands are unremarkable. Small right renal cyst.         Impression IMPRESSION:  1. No pulmonary embolism.  2. 1.7 x 1.6 cm left lower lobe nodule (prior measurement 1.3 x 1.6 cm  07/03/2015)  3. Stable 3 mm left upper lobe nodules.  4. Bilateral bullae.  5. 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  All Micro Results     Procedure Component Value Units Date/Time    CULTURE, URINE [[id number]]  (Abnormal) Collected:  07/24/16 0130    Order Status:  Completed Specimen:  Urine from Urine Updated:  07/25/16 0748     Culture result -- (A)        >100,000 CFU/mL  Skin and/or genital contamination      CULTURE, BLOOD [[id number]] Collected:  07/24/16 0055    Order Status:  Completed Specimen:  Blood from Blood Updated:  07/24/16 0747     Blood Culture Result         Culture In Progress, Daily Updates To Follow    CULTURE, BLOOD [[id number]] Collected:  07/24/16 0114    Order Status:  Completed Specimen:  Blood from Blood Updated:  07/24/16 0747     Blood Culture Result         Culture In Progress, Daily Updates To Follow          See my orders for details    My assessment, plan of care, findings, medications, side effects etc were discussed with:  nursing PT/OT    respiratory therapy Dr.   family Patient         Donavan Foil, MD,MBA

## 2016-07-25 NOTE — Telephone Encounter (Signed)
Thanks.    [name] DeedsJolson K Cayle Cordoba, MD

## 2016-07-25 NOTE — Consults (Signed)
Consults  by Donavan Foil, MD at 07/25/16 1424                Author: Donavan Foil, MD  Service: Pulmonary Disease  Author Type: Physician       Filed: 07/25/16 1553  Date of Service: 07/25/16 1424  Status: Signed          Editor: Donavan Foil, MD (Physician)                       [facility] PULMONARY AND CRITICAL CARE MEDICINE          Pulmonary Consult Note      Patient: [name] [name]               Sex: female          DOA: 07/23/2016         Date of Birth:  03/21/1951      Age:  66 y.o.        LOS:  LOS: 1 day          Reason for Consult: LLL nodule with interval increase in size         IMPRESSION:     ??    Recent CTA chest demonstrated interval increase in left lower lobe nodule currently measuring 1.7 cm x 1.6 cm (measuring   1.3 cm x 1.3  cm 07/03/15)   ??  Stable other multiple pulmonary nodules    ??  Bilateral bullous lung disease    ??  Reason presentation consistent with asthmatics/COPD exacerbation      ??    RECOMMENDATIONS:     ??    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 generally this tumor do not need any intervention until and unless patient experience any symptoms.   ??  Please consider follow-up CT scan in 6 months time to monitor size of the hamartoma and other pulmonary nodules.   ??  Continue prednisone for asthma/COPD exacerbation    ??  Continue current antibiosis. If patient remains stable than consider rapid de-escalation    ??  Supplemental O2 via NC, titrate flow for goal SPO2> 90%   ??  SQ LOVENOX for DVT prophylaxis    ??  Will defer respective systems problem management to primary and other consultant and follow patient with primary and other team   ??  Further recommendations will be based on the patient's response to recommended treatment and results of the investigation ordered.          HPI:      [name] [name] is a  66 y.o. female who  carries known history  of asthma and bullous lung disease with multiple  pulmonary nodules largest one measuring around 1.3 cm round for 3 cm located in the left lower lobe area related for interval increase in size of left lower lobe nodule measuring around 1.6 cm x 1.7 cm. Patient initially presented with asthma exacerbation  with current treatment and reports interval improvement in breathing. During hospitalization patient had CTA chest that demonstrated interval increase in size of left lower lobe nodule.   Per patient, she has had biopsy of left lower lobe nodule in January 2017 that demonstrated finding consistent with benign tumor. I personally reviewed biopsy report that is consistent with pulmonary hamartoma i.e. benign tumor.  Review of Systems   Review of Systems    Constitutional: Negative.     HENT: Negative.     Eyes: Negative.     Respiratory: Positive for wheezing.     Gastrointestinal: Negative.     Genitourinary: Negative.     Musculoskeletal: Negative.     Skin: Negative.     Neurological: Negative.     Endo/Heme/Allergies: Negative.     Psychiatric/Behavioral: Negative.              Past Medical History     Past Medical History:        Diagnosis  Date         ?  Arthritis       ?  Chronic pain       ?  Diabetes ([place])       ?  GERD (gastroesophageal reflux disease)       ?  Hypertension       ?  Liver disease            HEP C         ?  PTSD (post-traumatic stress disorder)            lived through the [place] in 1993           Past Surgical History     Past Surgical History:         Procedure  Laterality  Date          ?  HX ENDOSCOPY         ?  HX GYN    2010          hysterectomy          ?  HX HIP REPLACEMENT  Right  01/29/2015          ?  HX HIP REPLACEMENT               Family History     Family History         Problem  Relation  Age of Onset          ?  Hypertension  Mother       ?  Cancer  Maternal Aunt            ?  Alcohol abuse  Maternal Grandfather             Social History      Social History       Substance Use Topics         ?  Smoking status:  Never Smoker     ?  Smokeless tobacco:  Never Used         ?  Alcohol use  No            Medications     Current Facility-Administered Medications          Medication  Dose  Route  Frequency           ?  zolpidem (AMBIEN) tablet 5 mg   5 mg  Oral  QHS PRN     ?  cloNIDine HCl (CATAPRES) tablet 0.2 mg   0.2 mg  Oral  BID     ?  piperacillin-tazobactam (ZOSYN) 3.375 g in 0.9% sodium chloride (MBP/ADV) 100 mL MBP   3.375 g  IntraVENous  Q8H     ?  Vancomycin trough on 2/12 @ 1600     Other  ONCE     ?  albuterol (PROVENTIL VENTOLIN) nebulizer solution 2.5 mg   2.5 mg  Nebulization  Q2H PRN     ?  albuterol-ipratropium (DUO-NEB) 2.5 MG-0.5 MG/3 ML   3 mL  Nebulization  Q6H PRN     ?  melatonin tablet 3 mg   3 mg  Oral  QHS PRN     ?  acetaminophen (TYLENOL) tablet 650 mg   650 mg  Oral  Q6H PRN     ?  ondansetron (ZOFRAN) injection 4 mg   4 mg  IntraVENous  Q6H PRN     ?  promethazine (PHENERGAN) tablet 25 mg   25 mg  Oral  Q6H PRN     ?  benzonatate (TESSALON) capsule 100 mg   100 mg  Oral  TID PRN     ?  guaiFENesin (ROBITUSSIN) 100 mg/5 mL oral liquid 100 mg   100 mg  Oral  Q4H PRN     ?  amLODIPine (NORVASC) tablet 10 mg   10 mg  Oral  DAILY     ?  rosuvastatin (CRESTOR) tablet 5 mg   5 mg  Oral  QHS     ?  traZODone (DESYREL) tablet 100 mg   100 mg  Oral  QHS     ?  QUEtiapine SR (SEROquel XR) tablet 150 mg   150 mg  Oral  QHS           ?  oxyCODONE-acetaminophen (PERCOCET 10) 10-325 mg per tablet 1 Tab   1 Tab  Oral  Q8H PRN           ?  ibuprofen (MOTRIN) tablet 600 mg   600 mg  Oral  Q6H PRN     ?  gabapentin (NEURONTIN) capsule 600 mg   600 mg  Oral  TID     ?  glucagon (GLUCAGEN) injection 1 mg   1 mg  IntraMUSCular  PRN     ?  dextrose (D50) infusion 10-15 g   20-30 mL  IntraVENous  PRN     ?  insulin lispro (HUMALOG) injection     SubCUTAneous  AC&HS     ?  enoxaparin (LOVENOX) injection 40 mg   40 mg  SubCUTAneous  Q24H     ?  predniSONE  (DELTASONE) tablet 40 mg   40 mg  Oral  DAILY WITH BREAKFAST     ?  vancomycin (VANCOCIN) 1250 mg in NS 250 ml infusion   1,250 mg  IntraVENous  Q12H     ?  *Vancomycin: Pharmacy to Dose (r/o sepsis)   1 Each  Other  Rx Dosing/Monitoring           ?  piperacillin-tazobactam (ZOSYN) 3.375 g in dextrose 5% 100 mL IVPB   3.375 g  IntraVENous  Q8H          Prior to Admission medications             Medication  Sig  Start Date  End Date  Taking?  Authorizing Provider            amLODIPine (NORVASC) 10 mg tablet  TAKE ONE TABLET BY MOUTH ONCE DAILY  07/01/16    Yes  Jolson Dyann Kief, MD     rosuvastatin (CRESTOR) 5 mg tablet  Take 1 Tab by mouth nightly.  06/16/16    Yes  Lake Bells, MD     metFORMIN ER (GLUCOPHAGE XR) 750 mg tablet  TAKE 2 TABLETS EVERY  DAY  06/07/16    Yes  [place] Bells, MD     traZODone (DESYREL) 100 mg tablet  Take 1 Tab by mouth nightly.  05/16/16    Yes  Jolson Dyann Kief, MD     cloNIDine HCl (CATAPRES) 0.3 mg tablet  TAKE ONE TABLET BY MOUTH TWICE DAILY  05/11/16    Yes  Jolson Dyann Kief, MD     spironolactone (ALDACTONE) 100 mg tablet  Take 1 Tab by mouth daily.  05/10/16    Yes  [place] Bells, MD     losartan (COZAAR) 100 mg tablet  Take 1 Tab by mouth daily. Indications: hypertension  02/10/16    Yes  Jolson Dyann Kief, MD     albuterol (ACCUNEB) 0.63 mg/3 mL nebulizer solution  3 mL by Nebulization route every six (6) hours as needed for Wheezing.  01/19/16    Yes  Wallene Huh, PA-C     ipratropium-albuterol (COMBIVENT RESPIMAT) 20-100 mcg/actuation inhaler  Take 1 Puff by inhalation every four (4) hours as needed for Wheezing.  01/19/16    Yes  Wallene Huh, PA-C            gabapentin (NEURONTIN) 600 mg tablet  TAKE 1 TABLET BY MOUTH THREE TIMES DAILY  11/02/15    Yes  Lake Bells, MD            Blood-Glucose Meter monitoring kit  Check sugars 2 times per day, fasting and 2 hours after dinner  07/15/15    Yes  Lake Bells, MD     Lancets misc  Check sugars twice daily   07/15/15    Yes  Jolson Dyann Kief, MD     glucose blood VI test strips (ASCENSIA AUTODISC VI, ONE TOUCH ULTRA TEST VI) strip  Check sugars twice daily  07/15/15    Yes  Jolson Dyann Kief, MD     magnesium oxide (MAG-OX) 400 mg tablet  TAKE 1 TABLET BY MOUTH DAILY  05/10/16      [place] Bells, MD     QUEtiapine SR (SEROQUEL XR) 150 mg sr tablet  TAKE 1 TABLET BY MOUTH NIGHTLY  02/25/16      Lake Bells, MD     oxyCODONE-acetaminophen (PERCOCET 10) 10-325 mg per tablet  Take 1 Tab by mouth every eight (8) hours as needed for Pain. Max Daily Amount: 3 Tabs.  01/26/16      Jolson Dyann Kief, MD     ibuprofen (MOTRIN) 600 mg tablet  Take 1 Tab by mouth every eight (8) hours as needed for Pain.  12/18/15      Balthazar Loffler, PA-C            albuterol (PROVENTIL HFA, VENTOLIN HFA, PROAIR HFA) 90 mcg/actuation inhaler  Take 2 Puffs by inhalation every six (6) hours as needed for Wheezing or Shortness of Breath.  06/16/15      Rosine Marlynn Perking, FNP           Allergy     Allergies        Allergen  Reactions         ?  Chocolate [Cocoa]  Sneezing             Physical Exam:     Vital Signs:       Blood pressure 109/77, pulse 92, temperature 98.8 ??F (37.1 ??C), resp. rate 18, height 5' 7" (1.702 m), weight 83 kg (183 lb), SpO2 99 %.  Body mass index is 28.66 kg/(m^2).      O2 Device: Room air     O2 Flow Rate (L/min): 3 l/min     Temp (24hrs), Avg:98.6 ??F (37 ??C), Min:98.3 ??F (36.8 ??C), Max:99.2 ??F (37.3 ??C)           Intake/Output:    Last shift:      02/12 0701 - 02/12 1900   In: 580 [P.O.:480; I.V.:100]   Out: 450 [Urine:450]   Last 3 shifts: 02/10 1901 - 02/12 0700   In: 3602.5 [P.O.:2100; I.V.:1502.5]   Out: 2385 [Urine:2385]      Intake/Output Summary (Last 24 hours) at 07/25/16 1430  Last data filed at 07/25/16 1416        Gross per 24 hour     Intake            2182.5 ml     Output              1615 ml     Net             567.5 ml         Physical Exam    Constitutional: She is oriented to person, place, and time. She  appears well-developed and well-nourished.    HENT:    Head: Normocephalic and atraumatic.    Eyes: EOM are normal. Pupils are equal, round, and reactive to light.    Neck: Normal range of motion. Neck supple.    Cardiovascular: Regular rhythm.     Pulmonary/Chest: She has no wheezes.   Moderate air entry on both side    Abdominal: Soft. Bowel sounds are normal.   Musculoskeletal: She exhibits no edema.   Neurological: She is alert and oriented to person, place, and time.    Skin: Skin is warm and dry.   Psychiatric: She has a normal mood and affect.    Nursing note and vitals reviewed.               Labs Reviewed:     Recent Results (from the past 24 hour(s))     GLUCOSE, POC          Collection Time: 07/24/16  5:09 PM         Result  Value  Ref Range            Glucose (POC)  256 (H)  65 - 105 mg/dL       GLUCOSE, POC          Collection Time: 07/24/16  9:14 PM         Result  Value  Ref Range            Glucose (POC)  176 (H)  65 - 105 mg/dL       CBC WITH AUTOMATED DIFF          Collection Time: 07/25/16  7:15 AM         Result  Value  Ref Range            WBC  12.2 (H)  4.0 - 11.0 1000/mm3       RBC  4.48  3.60 - 5.20 M/uL       HGB  13.3  13.0 - 17.2 gm/dl       HCT  38.7  37.0 - 50.0 %       MCV  86.4  80.0 - 98.0 fL       MCH  29.7  25.4 -  34.6 pg       MCHC  34.4  30.0 - 36.0 gm/dl       PLATELET  253  140 - 450 1000/mm3       MPV  11.2 (H)  6.0 - 10.0 fL       RDW-SD  42.0  36.4 - 46.3         NRBC  0  0 - 0         IMMATURE GRANULOCYTES  0.4  0.0 - 3.0 %       NEUTROPHILS  77.4 (H)  34 - 64 %       LYMPHOCYTES  15.2 (L)  28 - 48 %       MONOCYTES  6.8  1 - 13 %       EOSINOPHILS  0.0  0 - 5 %       BASOPHILS  0.2  0 - 3 %       METABOLIC PANEL, COMPREHENSIVE          Collection Time: 07/25/16  7:15 AM         Result  Value  Ref Range            Sodium  139  136 - 145 mEq/L       Potassium  4.0  3.5 - 5.1 mEq/L       Chloride  108 (H)  98 - 107 mEq/L       CO2  24  21 - 32 mEq/L       Glucose  140 (H)  74  - 106 mg/dl       BUN  18  7 - 25 mg/dl       Creatinine  1.0  0.6 - 1.3 mg/dl       GFR est AA  >60.0          GFR est non-AA  59          Calcium  9.1  8.5 - 10.1 mg/dl       AST (SGOT)  4 (L)  15 - 37 U/L       ALT (SGPT)  11 (L)  12 - 78 U/L       Alk. phosphatase  65  45 - 117 U/L       Bilirubin, total  0.6  0.2 - 1.0 mg/dl       Protein, total  8.4 (H)  6.4 - 8.2 gm/dl       Albumin  3.6  3.4 - 5.0 gm/dl       GLUCOSE, POC          Collection Time: 07/25/16  8:14 AM         Result  Value  Ref Range            Glucose (POC)  142 (H)  65 - 105 mg/dL       GLUCOSE, POC          Collection Time: 07/25/16 11:57 AM         Result  Value  Ref Range            Glucose (POC)  220 (H)  65 - 105 mg/dL              No results for input(s): FIO2I, IFO2, HCO3I, IHCO3, HCOPOC, PCO2I, PCOPOC, IPHI, PHI, PHPOC, PO2I, PO2POC in the last 72 hours.      No lab exists for component: IPOC2  All Micro Results        Procedure  Component  Value  Units  Date/Time           CULTURE, URINE [[id number]]  (Abnormal)  Collected:  07/24/16 0130            Order Status:  Completed  Specimen:  Urine from Urine  Updated:  07/25/16 0748                Culture result  -- (A)                  >100,000 CFU/mL   Skin and/or genital contamination              CULTURE, BLOOD [[id number]]  Collected:  07/24/16 0055            Order Status:  Completed  Specimen:  Blood from Blood  Updated:  07/24/16 0747                Blood Culture Result                    Culture In Progress, Daily Updates To Follow           CULTURE, BLOOD [[id number]]  Collected:  07/24/16 0114            Order Status:  Completed  Specimen:  Blood from Blood  Updated:  07/24/16 0747                Blood Culture Result                    Culture In Progress, Daily Updates To Follow                      Imaging:   [x] I have personally reviewed the patients chest radiographs images and report  with the patient        Results from Hospital Encounter encounter on 07/23/16     XR  CHEST PA LAT          Narrative  Clinical history: Shortness of breath    EXAMINATION: PA and lateral views of the chest 07/23/2016    Correlation: 07/21/2016 and prior CT scans.     FINDINGS:  Trachea and heart size are within normal limits. There are bilateral bullae. 1.7  cm left lower lobe nodule also demonstrated on CTA chest. There is  hyperinflation. Right lung is clear.                   Impression  IMPRESSION:  1. 1.7 cm left lower lobe nodule  2. Bilateral bullae.                    Results from [place] encounter on 07/23/16     CTA CHEST W OR W WO CONT          Narrative  Clinical history: Dyspnea    EXAMINATION:   CTA chest with contrast. 3 mm spiral scanning is performed from the lung apices  to the upper  poles of the kidneys. Coronal, sagittal and 3-D MIP sequences have  been obtained.    Correlation: 03/22/2016 and 07/03/2015    FINDINGS:  Degenerative changes of the spine. Trachea, right and left mainstem bronchi are  patent.  Bilateral bullae. 3 mm left upper lobe nodules. 1.7 x 1.6 cm left lower  lobe nodule (measuring 1.3 x 1.3 cm 07/03/2015).  2.5 cm right thyroid nodule. The esophagus is not dilated. No dissection  thoracic aorta. Ascending aorta measures  4.4 cm. No lymph node enlargement in  the axilla, mediastinum or hila.    No pulmonary embolism. Visualized portions of the liver, gallbladder, spleen,  pancreas, adrenal glands are unremarkable. Small right renal cyst.                   Impression  IMPRESSION:  1. No pulmonary embolism.  2. 1.7 x 1.6 cm left lower lobe nodule (prior measurement 1.3 x 1.6 cm  07/03/2015)  3. Stable 3  mm left upper lobe nodules.  4. Bilateral bullae.  5. 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                  All Micro Results        Procedure  Component  Value  Units  Date/Time           CULTURE, URINE [[id number]]  (Abnormal)  Collected:  07/24/16 0130            Order Status:  Completed  Specimen:  Urine from Urine  Updated:  07/25/16  0748                Culture result  -- (A)                  >100,000 CFU/mL   Skin and/or genital contamination              CULTURE, BLOOD [[id number]]  Collected:  07/24/16 0055            Order Status:  Completed  Specimen:  Blood from Blood  Updated:  07/24/16 0747                Blood Culture Result                    Culture In Progress, Daily Updates To Follow           CULTURE, BLOOD [[id number]]  Collected:  07/24/16 0114            Order Status:  Completed  Specimen:  Blood from Blood  Updated:  07/24/16 0747                Blood Culture Result                    Culture In Progress, Daily Updates To Follow                [x] See my orders for details      My assessment, plan  of care, findings, medications, side effects etc were discussed with:      [x] nursing  [] PT/OT      [x] respiratory therapy  [] Dr.     [] family  [] Patient              Donavan Foil, MD,MBA

## 2016-07-26 LAB — GLUCOSE, POC
Glucose (POC): 172 mg/dL — ABNORMAL HIGH (ref 65–105)
Glucose (POC): 115 mg/dL — ABNORMAL HIGH (ref 65–105)
Glucose (POC): 194 mg/dL — ABNORMAL HIGH (ref 65–105)
Glucose (POC): 262 mg/dL — ABNORMAL HIGH (ref 65–105)

## 2016-07-26 LAB — CREATININE
Creatinine: 0.4 mg/dl — ABNORMAL LOW (ref 0.6–1.3)
GFR est AA: >60
GFR est non-AA: >60

## 2016-07-26 LAB — LACTIC ACID: Lactic Acid: 3.3 mmol/L — ABNORMAL HIGH (ref 0.4–2.0)

## 2016-07-26 MED ORDER — PHARMACY VANCOMYCIN NOTE
Freq: Once | Status: DC
Start: 2016-07-26 — End: 2016-07-26

## 2016-07-26 MED ORDER — VANCOMYCIN IN 0.9% SODIUM CHLORIDE 1.5 G/500 ML IV
1.5 g/500 mL | Freq: Two times a day (BID) | INTRAVENOUS | Status: DC
Start: 2016-07-26 — End: 2016-07-26
  Administered 2016-07-26: 10:00:00 via INTRAVENOUS

## 2016-07-26 MED FILL — VANCOMYCIN 1.5 GRAM/500 ML IN 0.9 % SODIUM CHLORIDE INTRAVENOUS SOLN: 1.5 gram/500 mL | INTRAVENOUS | Qty: 500

## 2016-07-26 MED FILL — SEROQUEL XR 50 MG TABLET,EXTENDED RELEASE: 50 mg | ORAL | Qty: 3

## 2016-07-26 MED FILL — TRAZODONE 100 MG TAB: 100 mg | ORAL | Qty: 1

## 2016-07-26 MED FILL — AMLODIPINE 5 MG TAB: 5 mg | ORAL | Qty: 2

## 2016-07-26 MED FILL — CLONIDINE 0.2 MG TAB: 0.2 mg | ORAL | Qty: 1

## 2016-07-26 MED FILL — INSULIN LISPRO 100 UNIT/ML INJECTION: 100 unit/mL | SUBCUTANEOUS | Qty: 100

## 2016-07-26 MED FILL — GABAPENTIN 300 MG CAP: 300 mg | ORAL | Qty: 2

## 2016-07-26 MED FILL — OXYCODONE-ACETAMINOPHEN 10 MG-325 MG TAB: 10-325 mg | ORAL | Qty: 1

## 2016-07-26 MED FILL — IPRATROPIUM-ALBUTEROL 2.5 MG-0.5 MG/3 ML NEB SOLUTION: 2.5 mg-0.5 mg/3 ml | RESPIRATORY_TRACT | Qty: 3

## 2016-07-26 MED FILL — ZOLPIDEM 5 MG TAB: 5 mg | ORAL | Qty: 1

## 2016-07-26 MED FILL — ROSUVASTATIN 5 MG TAB: 5 mg | ORAL | Qty: 1

## 2016-07-26 MED FILL — PHARMACY VANCOMYCIN NOTE: Qty: 1

## 2016-07-26 MED FILL — PIPERACILLIN-TAZOBACTAM 3.375 GRAM IV SOLR: 3.375 gram | INTRAVENOUS | Qty: 3.38

## 2016-07-26 MED FILL — PREDNISONE 20 MG TAB: 20 mg | ORAL | Qty: 2

## 2016-07-26 MED FILL — LOVENOX 40 MG/0.4 ML SUBCUTANEOUS SYRINGE: 40 mg/0.4 mL | SUBCUTANEOUS | Qty: 0.4

## 2016-07-26 NOTE — Progress Notes (Signed)
Bedside and Verbal shift change report given to Lauren RN (oncoming nurse) by Paz RN (offgoing nurse). Report included the following information SBAR and Kardex.

## 2016-07-26 NOTE — Progress Notes (Signed)
[facility]     Name: [name] ShutterMonica R [name] MRN: [id number]   DOB: 05/20/1951 [facility]   Date: 07/26/2016        IMPRESSION:   ?? Recent CTA chest demonstrated interval increase in left lower lobe nodule currently measuring 1.7 cm x 1.6 cm (measuring   1.3 cm x 1.3 cm 07/03/15)- S/P Biopsy in January 2017 demonstrated finding consistent with Hamartoma   ?? Stable other multiple pulmonary nodules   ?? Bilateral bullous lung disease   ?? Current presentation consistent with asthmatics/COPD exacerbation       PLAN:   ?? 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 Please consider follow-up CT scan in 6 months time to monitor size of the hamartoma and other pulmonary nodules.  ?? Continue prednisone for asthma/COPD exacerbation   ?? Discontinue Vancomycin. If stays stable consider further de-escalation   ?? Supplemental O2 via NC, titrate flow for goal SPO2> 90%  ?? SQ LOVENOX for DVT prophylaxis      Subjective/Interval History:         Review of Systems   Constitutional: Negative.    HENT: Negative.    Eyes: Negative.    Respiratory: Positive for cough and wheezing.    Gastrointestinal: Negative.    Genitourinary: Negative.    Musculoskeletal: Negative.    Skin: Negative.    Neurological: Negative.    Endo/Heme/Allergies: Negative.        Objective:   Vital Signs:    Visit Vitals   ??? BP (!) 123/94 (BP Patient Position: Supine)   ??? Pulse 81   ??? Temp 98.2 ??F (36.8 ??C)   ??? Resp 16   ??? Ht 5\' 7"  (1.702 m)   ??? Wt 83 kg (183 lb)   ??? SpO2 100%   ??? BMI 28.66 kg/m2       O2 Device: Room air   O2 Flow Rate (L/min): 3 l/min   Temp (24hrs), Avg:98.6 ??F (37 ??C), Min:98.2 ??F (36.8 ??C), Max:98.8 ??F (37.1 ??C)       Intake/Output:   Last shift:         Last 3 shifts: 02/11 1901 - 02/13 0700  In: 3132.5 [P.O.:1280; I.V.:1852.5]  Out: 1325 [Urine:1325]    Intake/Output Summary (Last 24 hours) at 07/26/16 0915  Last data filed at 07/26/16 0650    Gross per 24 hour   Intake             1880 ml   Output              325 ml   Net             1555 ml        Physical Exam   Constitutional: She is oriented to person, place, and time. She appears well-developed.   HENT:   Head: Normocephalic and atraumatic.   Eyes: EOM are normal. Pupils are equal, round, and reactive to light.   Neck: No thyromegaly present.   Cardiovascular: Regular rhythm.    Pulmonary/Chest: Effort normal. She has no wheezes. She has no rales.   Abdominal: Soft.   Musculoskeletal: She exhibits no edema.   Neurological: She is alert and oriented to person, place, and time.   Skin: Skin is warm and dry.   Nursing note and vitals reviewed.          DATA:  Labs:  Recent Labs  07/25/16   0715  07/24/16   1322  07/23/16   2255   WBC  12.2*   --   25.9*   HGB  13.3  12.8  14.9   HCT  38.7  38  42.9   PLT  253   --   369     Recent Labs      07/26/16   0621  07/25/16   0715  07/24/16   1322  07/24/16   0114  07/23/16   2255   NA   --   139  139   --   138   K   --   4.0  4.7   --   3.6   CL   --   108*  103   --   104   CO2   --   24  24.0   --   24   GLU   --   140*  293*   --   194*   BUN   --   18   --    --   24   CREA  0.4*  1.0  0.8   --   1.0   CA   --   9.1   --    --   9.5   ALB   --   3.6   --   3.8   --    SGOT   --   4*   --   4*   --    ALT   --   11*   --   10*   --      Recent Labs      07/24/16   1322   PH  7.391   PCO2  37.7   PO2  74.9*   HCO3  22.8   FIO2  21       CT Results (most recent):    Results from Hospital Encounter encounter on 07/23/16   CTA CHEST W OR W WO CONT   Narrative Clinical history: Dyspnea    EXAMINATION:   CTA chest with contrast. 3 mm spiral scanning is performed from the lung apices  to the upper poles of the kidneys. Coronal, sagittal and 3-D MIP sequences have  been obtained.    Correlation: 03/22/2016 and 07/03/2015    FINDINGS:  Degenerative changes of the spine. Trachea, right and left mainstem bronchi are   patent. Bilateral bullae. 3 mm left upper lobe nodules. 1.7 x 1.6 cm left lower  lobe nodule (measuring 1.3 x 1.3 cm 07/03/2015).    2.5 cm right thyroid nodule. The esophagus is not dilated. No dissection  thoracic aorta. Ascending aorta measures 4.4 cm. No lymph node enlargement in  the axilla, mediastinum or hila.    No pulmonary embolism. Visualized portions of the liver, gallbladder, spleen,  pancreas, adrenal glands are unremarkable. Small right renal cyst.         Impression IMPRESSION:  1. No pulmonary embolism.  2. 1.7 x 1.6 cm left lower lobe nodule (prior measurement 1.3 x 1.6 cm  07/03/2015)  3. Stable 3 mm left upper lobe nodules.  4. Bilateral bullae.  5. 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          Imaging:  I have personally reviewed the patient???s radiographs  Radiographs reviewed with radiologist   No change from prior, tubes and lines in adequate position  Improved   Worsening  Donavan Foil, MD

## 2016-07-26 NOTE — Progress Notes (Signed)
INTERNAL MEDICINE                                                                   Daily Progress Note    Patient:  [name] [name]  Today date: July 26, 2016  Date of Admission:  07/23/2016    Interval History and Events of the last 24 hours:  SOB better    Assessment/Plan:       Patient seen in follow up for multiple medical problems as listed below :  Asthma exacerbation  O2 supplementation  Bronchodilators, steroids    Pulm nodules  CT: 1.7 x 1.6 cm left lower lobe nodule (measuring 1.3 x 1.3 cm 07/03/2015).  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 Tonie Griffith      Lactic acidosis possible from sepsis or metformin  Rule out sepsis  Hold Metformin  Trend level  Abx for  Now  F/U culture    Leukocytosis like related to steroid  Monitor CBC  Culture pending    DM type II  Insulin Sliding Scale  Monitor FS    HTN  On Norvasc, Clonidine  Monitor BP closely      DVT Prophylaxis:    Code Status:  .Prior     Disposition and Family:   Recommend to continue hospitalization. Discussed with patient and nursing staff.    Anticipated Date of Discharge:1-2 days  Anticipated Disposition (home, SNF) : Home    Labwork and Ancillary Studies     All labs/tests/imaging reviewed.Spoke with the nurse regarding patient issues.    Labwork:    CBC w/Diff   Lab Results   Component Value Date/Time    WBC 12.2 (H) 07/25/2016 07:15 AM    HGB 13.3 07/25/2016 07:15 AM    HCT 38.7 07/25/2016 07:15 AM    PLATELET 253 07/25/2016 07:15 AM    MCV 86.4 07/25/2016 07:15 AM        Basic Metabolic Profile   Lab Results   Component Value Date/Time    Sodium 139 07/25/2016 07:15 AM    Potassium 4.0 07/25/2016 07:15 AM    Chloride 108 (H) 07/25/2016 07:15 AM    CO2 24 07/25/2016 07:15 AM    Anion gap 9 06/10/2016 01:00 PM    Glucose 140 (H) 07/25/2016 07:15 AM    BUN 18 07/25/2016 07:15 AM     Creatinine 0.4 (L) 07/26/2016 06:21 AM    BUN/Creatinine ratio 24 (H) 06/10/2016 01:00 PM    GFR est AA >60.0 07/26/2016 06:21 AM    GFR est non-AA >60 07/26/2016 06:21 AM    Calcium 9.1 07/25/2016 07:15 AM        Cardiac Enzymes   Lab Results   Component Value Date/Time    Troponin-I 0.00 07/23/2016 10:58 PM      Arterial Blood Gases   No results found for: PH, PHI, PCO2, PCO2I, PO2, PO2I, HCO3, HCO3I, FIO2, FIO2I   Coagulation    No results found for: APTT, PTP, INR   Hepatic Function   Lab Results   Component Value Date/Time    AST (SGOT) 4 (L) 07/25/2016 07:15 AM    Alk. phosphatase 65 07/25/2016 07:15 AM  Bilirubin, direct 0.1 07/24/2016 01:14 AM            XR Results (most recent):    Results from Hospital Encounter encounter on 07/23/16   XR CHEST PA LAT   Narrative Clinical history: Shortness of breath    EXAMINATION: PA and lateral views of the chest 07/23/2016    Correlation: 07/21/2016 and prior CT scans.    FINDINGS:  Trachea and heart size are within normal limits. There are bilateral bullae. 1.7  cm left lower lobe nodule also demonstrated on CTA chest. There is  hyperinflation. Right lung is clear.         Impression IMPRESSION:  1. 1.7 cm left lower lobe nodule  2. Bilateral bullae.          MRI Results (most recent):    Results from [place] encounter on 04/07/15   MRI BRAIN W WO CONT   Narrative CLINICAL HISTORY:  Abnormal CT, meningioma    PROCEDURE: MRI brain without and with contrast    COMPARISON: Head CT dated 04/08/2015    FINDINGS:    There is age-related cerebral atrophy. There is extensive chronic small vessel  ischemic change, advanced for patient age. As noted on CT, there is a enhancing  extra-axial mass in the left CP angle along the petrous temporal bone measuring  10 mm. This is observed on image 9 series 12, image 6 series 11 and image 21  series 13. There are dural tails extending to the tentorium cerebelli. Imaging   findings would favor meningioma. No other areas of abnormal brain parenchymal  enhancement. The sella, pineal region, craniocervical junction are unremarkable.  Skull base flow voids are patent.         Impression IMPRESSION:  1. Enhancing mass in the left posterior fossa adjacent to the petrous temporal  bone as described above. Main consideration would include meningioma. Other less  likely differential considerations would include dural metastasis and lymphoma.  Consider surveillance or further evaluation.    2.  Chronic small vessel ischemic changes, advanced for patient age.          CT Results (most recent):    Results from Hospital Encounter encounter on 07/23/16   CTA CHEST W OR W WO CONT   Narrative Clinical history: Dyspnea    EXAMINATION:   CTA chest with contrast. 3 mm spiral scanning is performed from the lung apices  to the upper poles of the kidneys. Coronal, sagittal and 3-D MIP sequences have  been obtained.    Correlation: 03/22/2016 and 07/03/2015    FINDINGS:  Degenerative changes of the spine. Trachea, right and left mainstem bronchi are  patent. Bilateral bullae. 3 mm left upper lobe nodules. 1.7 x 1.6 cm left lower  lobe nodule (measuring 1.3 x 1.3 cm 07/03/2015).    2.5 cm right thyroid nodule. The esophagus is not dilated. No dissection  thoracic aorta. Ascending aorta measures 4.4 cm. No lymph node enlargement in  the axilla, mediastinum or hila.    No pulmonary embolism. Visualized portions of the liver, gallbladder, spleen,  pancreas, adrenal glands are unremarkable. Small right renal cyst.         Impression IMPRESSION:  1. No pulmonary embolism.  2. 1.7 x 1.6 cm left lower lobe nodule (prior measurement 1.3 x 1.6 cm  07/03/2015)  3. Stable 3 mm left upper lobe nodules.  4. Bilateral bullae.  5. 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  Current Inpatient Meds and Allergies     Medications:  Current Facility-Administered Medications   Medication Dose Route Frequency    ??? zolpidem (AMBIEN) tablet 5 mg  5 mg Oral QHS PRN   ??? cloNIDine HCl (CATAPRES) tablet 0.2 mg  0.2 mg Oral BID   ??? piperacillin-tazobactam (ZOSYN) 3.375 g in 0.9% sodium chloride (MBP/ADV) 100 mL MBP  3.375 g IntraVENous Q8H   ??? albuterol (PROVENTIL VENTOLIN) nebulizer solution 2.5 mg  2.5 mg Nebulization Q2H PRN   ??? albuterol-ipratropium (DUO-NEB) 2.5 MG-0.5 MG/3 ML  3 mL Nebulization Q6H PRN   ??? melatonin tablet 3 mg  3 mg Oral QHS PRN   ??? acetaminophen (TYLENOL) tablet 650 mg  650 mg Oral Q6H PRN   ??? ondansetron (ZOFRAN) injection 4 mg  4 mg IntraVENous Q6H PRN   ??? promethazine (PHENERGAN) tablet 25 mg  25 mg Oral Q6H PRN   ??? benzonatate (TESSALON) capsule 100 mg  100 mg Oral TID PRN   ??? guaiFENesin (ROBITUSSIN) 100 mg/5 mL oral liquid 100 mg  100 mg Oral Q4H PRN   ??? amLODIPine (NORVASC) tablet 10 mg  10 mg Oral DAILY   ??? rosuvastatin (CRESTOR) tablet 5 mg  5 mg Oral QHS   ??? traZODone (DESYREL) tablet 100 mg  100 mg Oral QHS   ??? QUEtiapine SR (SEROquel XR) tablet 150 mg  150 mg Oral QHS   ??? oxyCODONE-acetaminophen (PERCOCET 10) 10-325 mg per tablet 1 Tab  1 Tab Oral Q8H PRN   ??? ibuprofen (MOTRIN) tablet 600 mg  600 mg Oral Q6H PRN   ??? gabapentin (NEURONTIN) capsule 600 mg  600 mg Oral TID   ??? glucagon (GLUCAGEN) injection 1 mg  1 mg IntraMUSCular PRN   ??? dextrose (D50) infusion 10-15 g  20-30 mL IntraVENous PRN   ??? insulin lispro (HUMALOG) injection   SubCUTAneous AC&HS   ??? enoxaparin (LOVENOX) injection 40 mg  40 mg SubCUTAneous Q24H   ??? predniSONE (DELTASONE) tablet 40 mg  40 mg Oral DAILY WITH BREAKFAST          Objective:       Visit Vitals   ??? BP 127/88 (BP Patient Position: Supine)   ??? Pulse 85   ??? Temp 98.1 ??F (36.7 ??C)   ??? Resp 17   ??? Ht 5' 7"  (1.702 m)   ??? Wt 83 kg (183 lb)   ??? SpO2 95%   ??? BMI 28.66 kg/m2     Body mass index is 28.66 kg/(m^2).      Intake/Output Summary (Last 24 hours) at 07/26/16 1514  Last data filed at 07/26/16 0940   Gross per 24 hour   Intake             1660 ml    Output                0 ml   Net             1660 ml       General: ?? Alert, cooperative, no distress, appears stated age.??   Lungs:?? ?? Expiratory  wheeze   Chest wall: ?? No tenderness or deformity.??   Heart: ?? Regular rate and rhythm, S1, S2 normal, no murmur, click, rub or gallop.??   Abdomen:?? ?? Soft, non-tender. Bowel sounds normal. No masses,?? No organomegaly.??       Musculoskeleta : Normal range of motion in most of the joints   Extremities:?? Extremities normal, atraumatic, no cyanosis  or edema.??   Pulses:?? 2+ and symmetric all extremities.??   Skin:?? Skin color, texture, turgor normal. No rashes or lesions??   Neurologic  Psych:?? : CNII-XII intact.    Normal affect and mood . No thoughts of harm to self or others??             Total time spent with chart review, patient examination/education, discussion with staff on case,documentation and medication management / adjustment:   >35 minutes    It is always a pleasure to be involved in the clinical care of this patient.    Otila Kluver, MD  July 26, 2016  [facility] Internal Medicine  Pager:  [phone]

## 2016-07-27 LAB — CREATININE
Creatinine: 0.8 mg/dl (ref 0.6–1.3)
GFR est AA: >60
GFR est non-AA: >60

## 2016-07-27 LAB — GLUCOSE, POC
Glucose (POC): 188 mg/dL — ABNORMAL HIGH (ref 65–105)
Glucose (POC): 144 mg/dL — ABNORMAL HIGH (ref 65–105)
Glucose (POC): 220 mg/dL — ABNORMAL HIGH (ref 65–105)

## 2016-07-27 LAB — LACTIC ACID: Lactic Acid: 1.6 mmol/L (ref 0.4–2.0)

## 2016-07-27 MED ORDER — PREDNISONE 10 MG TAB
10 mg | ORAL_TABLET | Freq: Every day | ORAL | 0 refills | Status: DC
Start: 2016-07-27 — End: 2016-08-17

## 2016-07-27 MED FILL — ROSUVASTATIN 5 MG TAB: 5 mg | ORAL | Qty: 1

## 2016-07-27 MED FILL — PIPERACILLIN-TAZOBACTAM 3.375 GRAM IV SOLR: 3.375 gram | INTRAVENOUS | Qty: 3.38

## 2016-07-27 MED FILL — GABAPENTIN 300 MG CAP: 300 mg | ORAL | Qty: 2

## 2016-07-27 MED FILL — SEROQUEL XR 50 MG TABLET,EXTENDED RELEASE: 50 mg | ORAL | Qty: 3

## 2016-07-27 MED FILL — ZOLPIDEM 5 MG TAB: 5 mg | ORAL | Qty: 1

## 2016-07-27 MED FILL — TRAZODONE 100 MG TAB: 100 mg | ORAL | Qty: 1

## 2016-07-27 MED FILL — LOVENOX 40 MG/0.4 ML SUBCUTANEOUS SYRINGE: 40 mg/0.4 mL | SUBCUTANEOUS | Qty: 0.4

## 2016-07-27 MED FILL — CLONIDINE 0.2 MG TAB: 0.2 mg | ORAL | Qty: 1

## 2016-07-27 MED FILL — AMLODIPINE 5 MG TAB: 5 mg | ORAL | Qty: 2

## 2016-07-27 MED FILL — PREDNISONE 20 MG TAB: 20 mg | ORAL | Qty: 2

## 2016-07-27 MED FILL — INSULIN LISPRO 100 UNIT/ML INJECTION: 100 unit/mL | SUBCUTANEOUS | Qty: 1

## 2016-07-27 MED FILL — ACETAMINOPHEN 325 MG TABLET: 325 mg | ORAL | Qty: 2

## 2016-07-27 MED FILL — OXYCODONE-ACETAMINOPHEN 10 MG-325 MG TAB: 10-325 mg | ORAL | Qty: 1

## 2016-07-27 NOTE — Progress Notes (Signed)
Discharged home via w/c with husband.

## 2016-07-27 NOTE — Discharge Summary (Signed)
Discharge Summary       Patient: [name] [name] Age: 66 y.o. DOB: 09-15-1950 MR#: [id number] SSN: [id number]  PCP on record: Lake Bells, MD  Admit date: 07/23/2016  Discharge date: 07/27/2016    Admission Diagnoses:   Sepsis ([place])  Asthma exacerbation  Lactic acidosis  Leukocytosis  -    Discharge Diagnoses:     Asthma exacerbation  Pulm nodules  Lactic acidosis possible from  metformin  DM type II  HTN    ??    Medical History   Patient is a very pleasant 66 yo F with asthma started to have SOB cough similar to prior asthma exacerbation. She was in resp distress and very dyspneic, arrived in ER received steroids and bronchodilators. She had CT chest done. She was noted to have elevated WBC. No fever, no chills, no N/V/D.   ??      Hospital Course by Problem   Patient seen in follow up for multiple medical problems as listed below :  Asthma exacerbation  O2 supplementation  Bronchodilators, steroids  PCCM consulted  Resolved   ??  Pulm nodules  CT: 1.7 x 1.6 cm left lower lobe nodule (measuring 1.3 x 1.3 cm 07/03/2015).  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 Tonie Griffith  ??  ??  Lactic acidosis possible from sepsis or metformin  Ruled out sepsis, Blood culture no growth  Hold Metformin  Resolved   ??  Leukocytosis like related to steroid  Monitor CBC    ??  DM type II  Insulin Sliding Scale  Monitor FS  ??  HTN  On Norvasc, Clonidine  Monitor BP closely  ??      Stable to D/C home       Today's examination of the patient revealed:     Objective:   VS:   Visit Vitals   ??? BP (!) 132/97 (BP 1 Location: Right arm, BP Patient Position: Supine)   ??? Pulse 69   ??? Temp 98 ??F (36.7 ??C)   ??? Resp 18   ??? Ht 5' 7"  (1.702 m)   ??? Wt 83 kg (183 lb)   ??? SpO2 99%   ??? BMI 28.66 kg/m2      Tmax/24hrs: Temp (24hrs), Avg:98.2 ??F (36.8 ??C), Min:98 ??F (36.7 ??C), Max:98.3 ??F (36.8 ??C)     Input/Output:   Intake/Output Summary (Last 24 hours) at 07/27/16 1117   Last data filed at 07/27/16 4580   Gross per 24 hour   Intake             1180 ml   Output                0 ml   Net             1180 ml       General appearance: no distress  Eyes: sclera anicteric  ENT: no oral lesions, thyroid normal  Skin: no spider angiomata, jaundice,   Respiratory: clear to auscultation bilaterally  Cardiovascular: regular heart rate, no murmurs, no JVD  Abdomen: soft, non-tender, no rebound  Extremities: no muscle wasting, no gross arthritic changes  GU: not examined  Neurologic: alert and oriented, cranial nerves grossly intact,         Labs:    Recent Results (from the past 24 hour(s))   GLUCOSE, POC    Collection Time: 07/26/16 12:40 PM   Result Value Ref  Range    Glucose (POC) 194 (H) 65 - 105 mg/dL   LACTIC ACID    Collection Time: 07/26/16  4:01 PM   Result Value Ref Range    Lactic Acid 3.3 (H) 0.4 - 2.0 mmol/L   GLUCOSE, POC    Collection Time: 07/26/16  4:15 PM   Result Value Ref Range    Glucose (POC) 262 (H) 65 - 105 mg/dL   GLUCOSE, POC    Collection Time: 07/26/16  9:17 PM   Result Value Ref Range    Glucose (POC) 188 (H) 65 - 105 mg/dL   CREATININE    Collection Time: 07/27/16  7:19 AM   Result Value Ref Range    Creatinine 0.8 0.6 - 1.3 mg/dl    GFR est AA >60.0      GFR est non-AA >60     LACTIC ACID    Collection Time: 07/27/16  7:19 AM   Result Value Ref Range    Lactic Acid 1.6 0.4 - 2.0 mmol/L   GLUCOSE, POC    Collection Time: 07/27/16  7:36 AM   Result Value Ref Range    Glucose (POC) 144 (H) 65 - 105 mg/dL     Additional Data Reviewed:      XR Results (most recent):    Results from Hospital Encounter encounter on 07/23/16   XR CHEST PA LAT   Narrative Clinical history: Shortness of breath    EXAMINATION: PA and lateral views of the chest 07/23/2016    Correlation: 07/21/2016 and prior CT scans.    FINDINGS:  Trachea and heart size are within normal limits. There are bilateral bullae. 1.7  cm left lower lobe nodule also demonstrated on CTA chest. There is   hyperinflation. Right lung is clear.         Impression IMPRESSION:  1. 1.7 cm left lower lobe nodule  2. Bilateral bullae.            CT Results (most recent):    Results from Hospital Encounter encounter on 07/23/16   CTA CHEST W OR W WO CONT   Narrative Clinical history: Dyspnea    EXAMINATION:   CTA chest with contrast. 3 mm spiral scanning is performed from the lung apices  to the upper poles of the kidneys. Coronal, sagittal and 3-D MIP sequences have  been obtained.    Correlation: 03/22/2016 and 07/03/2015    FINDINGS:  Degenerative changes of the spine. Trachea, right and left mainstem bronchi are  patent. Bilateral bullae. 3 mm left upper lobe nodules. 1.7 x 1.6 cm left lower  lobe nodule (measuring 1.3 x 1.3 cm 07/03/2015).    2.5 cm right thyroid nodule. The esophagus is not dilated. No dissection  thoracic aorta. Ascending aorta measures 4.4 cm. No lymph node enlargement in  the axilla, mediastinum or hila.    No pulmonary embolism. Visualized portions of the liver, gallbladder, spleen,  pancreas, adrenal glands are unremarkable. Small right renal cyst.         Impression IMPRESSION:  1. No pulmonary embolism.  2. 1.7 x 1.6 cm left lower lobe nodule (prior measurement 1.3 x 1.6 cm  07/03/2015)  3. Stable 3 mm left upper lobe nodules.  4. Bilateral bullae.  5. 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               Condition:   Disposition:    Home   Home with Home Health   SNF/NH  Rehab   Home with family   Alternate Facility:____________________      Discharge Medications:        My Medications      START taking these medications       Instructions Each Dose to Equal   Morning Noon Evening Bedtime    predniSONE 10 mg tablet   Commonly known as:  DELTASONE   Start taking on:  07/28/2016       Your last dose was:         Your next dose is:              Take 3 Tabs by mouth daily (with breakfast). 30 mg daily x2, 20 mg daily x3, 10 mg daily x3    30 mg                           CONTINUE taking these medications       Instructions Each Dose to Equal   Morning Noon Evening Bedtime    * albuterol 90 mcg/actuation inhaler   Commonly known as:  PROVENTIL HFA, VENTOLIN HFA, PROAIR HFA       Your last dose was:         Your next dose is:              Take 2 Puffs by inhalation every six (6) hours as needed for Wheezing or Shortness of Breath.    2 Puff                        * albuterol 0.63 mg/3 mL nebulizer solution   Commonly known as:  ACCUNEB       Your last dose was:         Your next dose is:              3 mL by Nebulization route every six (6) hours as needed for Wheezing.    0.63 mg                        amLODIPine 10 mg tablet   Commonly known as:  NORVASC       Your last dose was:         Your next dose is:              TAKE ONE TABLET BY MOUTH ONCE DAILY                         Blood-Glucose Meter monitoring kit       Your last dose was:         Your next dose is:              Check sugars 2 times per day, fasting and 2 hours after dinner                         cloNIDine HCl 0.3 mg tablet   Commonly known as:  CATAPRES       Your last dose was:         Your next dose is:              TAKE ONE TABLET BY MOUTH TWICE DAILY  gabapentin 600 mg tablet   Commonly known as:  NEURONTIN       Your last dose was:         Your next dose is:              TAKE 1 TABLET BY MOUTH THREE TIMES DAILY                         glucose blood VI test strips strip   Commonly known as:  ASCENSIA AUTODISC VI, ONE TOUCH ULTRA TEST VI       Your last dose was:         Your next dose is:              Check sugars twice daily                         ipratropium-albuterol 20-100 mcg/actuation inhaler   Commonly known as:  COMBIVENT RESPIMAT       Your last dose was:         Your next dose is:              Take 1 Puff by inhalation every four (4) hours as needed for Wheezing.    1 Puff                        Lancets Misc       Your last dose was:         Your next dose is:               Check sugars twice daily                         losartan 100 mg tablet   Commonly known as:  COZAAR       Your last dose was:         Your next dose is:              Take 1 Tab by mouth daily. Indications: hypertension    100 mg                        magnesium oxide 400 mg tablet   Commonly known as:  MAG-OX       Your last dose was:         Your next dose is:              TAKE 1 TABLET BY MOUTH DAILY                         metFORMIN ER 750 mg tablet   Commonly known as:  GLUCOPHAGE XR       Your last dose was:         Your next dose is:              TAKE 2 TABLETS EVERY DAY                         QUEtiapine SR 150 mg sr tablet   Commonly known as:  SEROquel XR       Your last dose was:         Your next dose is:  TAKE 1 TABLET BY MOUTH NIGHTLY                         rosuvastatin 5 mg tablet   Commonly known as:  CRESTOR       Your last dose was:         Your next dose is:              Take 1 Tab by mouth nightly.    5 mg                        spironolactone 100 mg tablet   Commonly known as:  ALDACTONE       Your last dose was:         Your next dose is:              Take 1 Tab by mouth daily.    100 mg                        traZODone 100 mg tablet   Commonly known as:  DESYREL       Your last dose was:         Your next dose is:              Take 1 Tab by mouth nightly.    100 mg                        * Notice:  This list has 2 medication(s) that are the same as other medications prescribed for you. Read the directions carefully, and ask your doctor or other care provider to review them with you.      STOP taking these medications          ibuprofen 600 mg tablet   Commonly known as:  MOTRIN           oxyCODONE-acetaminophen 10-325 mg per tablet   Commonly known as:  PERCOCET 10                Where to Get Your Medications      These medications were sent to [facility], [place]  [place], [place] [address]     Phone:  [phone]     ??? predniSONE 10 mg tablet                 Follow-up Appointments:   Your PCP: Lake Bells, MD, within 3-5 days  Follow-up Information     Follow up With Details [place], MD   [address]  [place] [address]  [phone]      Tedd Sias, MD In 2 weeks  [address]  [place] [address]  [phone]              Consults: Pulmonary/Critical Care    Treatment Team: Treatment Team: Attending Provider: Otila Kluver, MD; Consulting Provider: Lacey Jensen, MD; Care Manager: Cleotilde Neer, RN; Consulting Provider: Donavan Foil, MD      Thank you Dr Lake Bells, MD for allowing [place] to participate in the care of [facility]       >42 minutes spent coordinating this discharge (review instructions/follow-up, prescriptions)    Signed:  [facility]  Otila Kluver, MD  07/27/2016  11:17 AM

## 2016-07-27 NOTE — Other (Addendum)
----------  DocumentID: [id number]------------------------------------------------              [facility]                       Patient Education Report         Name: [name] [name], [name] [name]                  Date: 07/24/2016    MRN: [id number]                    Time: 3:34:03 AM         Patient ordered video: 'Patient Safety: Stay Safe While you are in the Hospital'    from [id number] via phone number: [phone] at 3:34:03 AM    Description: This program outlines some of the precautions patients can take to ensure a speedy recovery without extra complications. The video emphasizes the importance of communicating with the healthcare team.    ----------DocumentID: [id number]------------------------------------------------                       [facility]          Patient Education Report - Discharge Summary        Date: 07/27/2016   Time: 1:38:19 PM   Name: [name] [name], [name] [name]   MRN: [id number]      Account Number: [id number]      Education History:        Patient ordered video: 'Patient Safety: Stay Safe While you are in the Hospital' from [id number] on 07/24/2016 03:34:03 AM

## 2016-07-27 NOTE — Progress Notes (Signed)
BP running previously 132/97, BP rechecked after BP meds given-151/96, Dr. Terrace ArabiaYan notified,no new order received.

## 2016-07-27 NOTE — Progress Notes (Signed)
Discharge Plan:   Home with spouse and family assistance and follow up with MD and pulmonologist. See EPIc for follow up care and appointments as noted. Dc instructions per MD and dc RN to pt.    Discharge Date:     07/27/2016       Home Health Needed:     None     Home Health Agency:    none    Transportation: Family/spouse

## 2016-07-27 NOTE — Progress Notes (Signed)
Discharge instructions given & instructed pt to pick up script for prednisone to her pharmacy.

## 2016-07-27 NOTE — Discharge Summary (Signed)
Discharge Summary       Patient: [name] [name] Age: 66 y.o. DOB: 09-12-1950 MR#: [id number] SSN: [id number]  PCP on record: Lake Bells, MD  Admit date: 07/23/2016  Discharge date: 07/27/2016    Admission Diagnoses:   Sepsis ([place])  Asthma exacerbation  Lactic acidosis  Leukocytosis  -    Discharge Diagnoses:     Asthma exacerbation  Pulm nodules  Lactic acidosis possible from  metformin  DM type II  HTN    ??    Medical History   Patient is a very pleasant 66 yo F with asthma started to have SOB cough similar to prior asthma exacerbation. She was in resp distress and very dyspneic, arrived in ER received steroids and bronchodilators. She had CT chest done. She was noted to have elevated WBC. No fever, no chills, no N/V/D.   ??      Hospital Course by Problem   Patient seen in follow up for multiple medical problems as listed below :  Asthma exacerbation  O2 supplementation  Bronchodilators, steroids  PCCM consulted  Resolved   ??  Pulm nodules  CT: 1.7 x 1.6 cm left lower lobe nodule (measuring 1.3 x 1.3 cm 07/03/2015).  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 Tonie Griffith  ??  ??  Lactic acidosis possible from sepsis or metformin  Ruled out sepsis, Blood culture no growth  Hold Metformin  Resolved   ??  Leukocytosis like related to steroid  Monitor CBC    ??  DM type II  Insulin Sliding Scale  Monitor FS  ??  HTN  On Norvasc, Clonidine  Monitor BP closely  ??      Stable to D/C home       Today's examination of the patient revealed:     Objective:   VS:   Visit Vitals   ??? BP (!) 132/97 (BP 1 Location: Right arm, BP Patient Position: Supine)   ??? Pulse 69   ??? Temp 98 ??F (36.7 ??C)   ??? Resp 18   ??? Ht '5\' 7"'$  (1.702 m)   ??? Wt 83 kg (183 lb)   ??? SpO2 99%   ??? BMI 28.66 kg/m2      Tmax/24hrs: Temp (24hrs), Avg:98.2 ??F (36.8 ??C), Min:98 ??F (36.7 ??C), Max:98.3 ??F (36.8 ??C)     Input/Output:   Intake/Output Summary (Last 24 hours) at 07/27/16 1117  Last data filed at  07/27/16 2536   Gross per 24 hour   Intake             1180 ml   Output                0 ml   Net             1180 ml       General appearance: no distress  Eyes: sclera anicteric  ENT: no oral lesions, thyroid normal  Skin: no spider angiomata, jaundice,   Respiratory: clear to auscultation bilaterally  Cardiovascular: regular heart rate, no murmurs, no JVD  Abdomen: soft, non-tender, no rebound  Extremities: no muscle wasting, no gross arthritic changes  GU: not examined  Neurologic: alert and oriented, cranial nerves grossly intact,         Labs:    Recent Results (from the past 24 hour(s))   GLUCOSE, POC    Collection Time: 07/26/16 12:40 PM   Result Value Ref  Range    Glucose (POC) 194 (H) 65 - 105 mg/dL   LACTIC ACID    Collection Time: 07/26/16  4:01 PM   Result Value Ref Range    Lactic Acid 3.3 (H) 0.4 - 2.0 mmol/L   GLUCOSE, POC    Collection Time: 07/26/16  4:15 PM   Result Value Ref Range    Glucose (POC) 262 (H) 65 - 105 mg/dL   GLUCOSE, POC    Collection Time: 07/26/16  9:17 PM   Result Value Ref Range    Glucose (POC) 188 (H) 65 - 105 mg/dL   CREATININE    Collection Time: 07/27/16  7:19 AM   Result Value Ref Range    Creatinine 0.8 0.6 - 1.3 mg/dl    GFR est AA >60.0      GFR est non-AA >60     LACTIC ACID    Collection Time: 07/27/16  7:19 AM   Result Value Ref Range    Lactic Acid 1.6 0.4 - 2.0 mmol/L   GLUCOSE, POC    Collection Time: 07/27/16  7:36 AM   Result Value Ref Range    Glucose (POC) 144 (H) 65 - 105 mg/dL     Additional Data Reviewed:      XR Results (most recent):    Results from Hospital Encounter encounter on 07/23/16   XR CHEST PA LAT   Narrative Clinical history: Shortness of breath    EXAMINATION: PA and lateral views of the chest 07/23/2016    Correlation: 07/21/2016 and prior CT scans.    FINDINGS:  Trachea and heart size are within normal limits. There are bilateral bullae. 1.7  cm left lower lobe nodule also demonstrated on CTA chest. There is  hyperinflation. Right lung is  clear.         Impression IMPRESSION:  1. 1.7 cm left lower lobe nodule  2. Bilateral bullae.            CT Results (most recent):    Results from Hospital Encounter encounter on 07/23/16   CTA CHEST W OR W WO CONT   Narrative Clinical history: Dyspnea    EXAMINATION:   CTA chest with contrast. 3 mm spiral scanning is performed from the lung apices  to the upper poles of the kidneys. Coronal, sagittal and 3-D MIP sequences have  been obtained.    Correlation: 03/22/2016 and 07/03/2015    FINDINGS:  Degenerative changes of the spine. Trachea, right and left mainstem bronchi are  patent. Bilateral bullae. 3 mm left upper lobe nodules. 1.7 x 1.6 cm left lower  lobe nodule (measuring 1.3 x 1.3 cm 07/03/2015).    2.5 cm right thyroid nodule. The esophagus is not dilated. No dissection  thoracic aorta. Ascending aorta measures 4.4 cm. No lymph node enlargement in  the axilla, mediastinum or hila.    No pulmonary embolism. Visualized portions of the liver, gallbladder, spleen,  pancreas, adrenal glands are unremarkable. Small right renal cyst.         Impression IMPRESSION:  1. No pulmonary embolism.  2. 1.7 x 1.6 cm left lower lobe nodule (prior measurement 1.3 x 1.6 cm  07/03/2015)  3. Stable 3 mm left upper lobe nodules.  4. Bilateral bullae.  5. 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               Condition:   Disposition:    '[x]'$ Home   '[]'$ Home with Home Health   '[]'$ SNF/NH   '[]'$   Rehab   '[]'$ Home with family   '[]'$ Alternate Facility:____________________      Discharge Medications:        My Medications      START taking these medications       Instructions Each Dose to Equal   Morning Noon Evening Bedtime    predniSONE 10 mg tablet   Commonly known as:  DELTASONE   Start taking on:  07/28/2016       Your last dose was:         Your next dose is:              Take 3 Tabs by mouth daily (with breakfast). 30 mg daily x2, 20 mg daily x3, 10 mg daily x3    30 mg                          CONTINUE taking these medications        Instructions Each Dose to Equal   Morning Noon Evening Bedtime    * albuterol 90 mcg/actuation inhaler   Commonly known as:  PROVENTIL HFA, VENTOLIN HFA, PROAIR HFA       Your last dose was:         Your next dose is:              Take 2 Puffs by inhalation every six (6) hours as needed for Wheezing or Shortness of Breath.    2 Puff                        * albuterol 0.63 mg/3 mL nebulizer solution   Commonly known as:  ACCUNEB       Your last dose was:         Your next dose is:              3 mL by Nebulization route every six (6) hours as needed for Wheezing.    0.63 mg                        amLODIPine 10 mg tablet   Commonly known as:  NORVASC       Your last dose was:         Your next dose is:              TAKE ONE TABLET BY MOUTH ONCE DAILY                         Blood-Glucose Meter monitoring kit       Your last dose was:         Your next dose is:              Check sugars 2 times per day, fasting and 2 hours after dinner                         cloNIDine HCl 0.3 mg tablet   Commonly known as:  CATAPRES       Your last dose was:         Your next dose is:              TAKE ONE TABLET BY MOUTH TWICE DAILY  gabapentin 600 mg tablet   Commonly known as:  NEURONTIN       Your last dose was:         Your next dose is:              TAKE 1 TABLET BY MOUTH THREE TIMES DAILY                         glucose blood VI test strips strip   Commonly known as:  ASCENSIA AUTODISC VI, ONE TOUCH ULTRA TEST VI       Your last dose was:         Your next dose is:              Check sugars twice daily                         ipratropium-albuterol 20-100 mcg/actuation inhaler   Commonly known as:  COMBIVENT RESPIMAT       Your last dose was:         Your next dose is:              Take 1 Puff by inhalation every four (4) hours as needed for Wheezing.    1 Puff                        Lancets Misc       Your last dose was:         Your next dose is:              Check sugars twice daily                          losartan 100 mg tablet   Commonly known as:  COZAAR       Your last dose was:         Your next dose is:              Take 1 Tab by mouth daily. Indications: hypertension    100 mg                        magnesium oxide 400 mg tablet   Commonly known as:  MAG-OX       Your last dose was:         Your next dose is:              TAKE 1 TABLET BY MOUTH DAILY                         metFORMIN ER 750 mg tablet   Commonly known as:  GLUCOPHAGE XR       Your last dose was:         Your next dose is:              TAKE 2 TABLETS EVERY DAY                         QUEtiapine SR 150 mg sr tablet   Commonly known as:  SEROquel XR       Your last dose was:         Your next dose is:  TAKE 1 TABLET BY MOUTH NIGHTLY                         rosuvastatin 5 mg tablet   Commonly known as:  CRESTOR       Your last dose was:         Your next dose is:              Take 1 Tab by mouth nightly.    5 mg                        spironolactone 100 mg tablet   Commonly known as:  ALDACTONE       Your last dose was:         Your next dose is:              Take 1 Tab by mouth daily.    100 mg                        traZODone 100 mg tablet   Commonly known as:  DESYREL       Your last dose was:         Your next dose is:              Take 1 Tab by mouth nightly.    100 mg                        * Notice:  This list has 2 medication(s) that are the same as other medications prescribed for you. Read the directions carefully, and ask your doctor or other care provider to review them with you.      STOP taking these medications          ibuprofen 600 mg tablet   Commonly known as:  MOTRIN           oxyCODONE-acetaminophen 10-325 mg per tablet   Commonly known as:  PERCOCET 10                Where to Get Your Medications      These medications were sent to [facility], [address]  [place], [place] [address]     Phone:  [phone]    ??? predniSONE 10 mg tablet                 Follow-up  Appointments:   Your PCP: Lake Bells, MD, within 3-5 days  Follow-up Information     Follow up With Details [place], MD   [address]  [place] [address]  [phone]      Tedd Sias, MD In 2 weeks  [address]  [place] [address]  [phone]              Consults: Pulmonary/Critical Care    Treatment Team: Treatment Team: Attending Provider: Otila Kluver, MD; Consulting Provider: Lacey Jensen, MD; Care Manager: Cleotilde Neer, RN; Consulting Provider: Donavan Foil, MD      Thank you Dr Lake Bells, MD for allowing [place] to participate in the care of [facility]       >42 minutes spent coordinating this discharge (review instructions/follow-up, prescriptions)    Signed:  [facility]  Otila Kluver, MD  07/27/2016  11:17 AM

## 2016-07-29 LAB — CULTURE, BLOOD
Blood Culture Result: NO GROWTH
Blood Culture Result: NO GROWTH

## 2016-07-29 NOTE — Progress Notes (Signed)
Transition of Care  Hospital Discharge    Hospital:    [facility]   Admission Date:          07/23/16  Discharge Date:          07/27/16     Disposition:  Home    Per DC Summary of 07-27-16 by Dr. Consuelo PandyA. Khanna  ( in [employer]):     Discharge Diagnoses:   ??  Asthma exacerbation  Pulm nodules  Lactic acidosis possible from  metformin  DM type II  HTN      RRAT:  22 - High       Recent Lab Values per EMR Review :   07/27/16:   Glucose, fast - POC   0736 = 144  1200 =  220    07/25/16  WBC  0715 = 12.2      Home Health: n/a         Medication Review:     Nurse Navigator unable to complete medication review with patient or spouse. Patient did state that prescription/prescriptions have been picked up and she has started Prednisone.     Nurse navigator discussed with patient that the following medications have been stopped (per AVS):     -Ibuprofen  600 mg tablet    - OxyCodone-Acetaminophen 10-325 (Percocet)      Patient handed the phone to her spouse for further discussion.     Red Flags:     Shortness of breath  Dizziness  Fainiting  Light headed  Black stools     Patient's spouse interrupted review of red flags and stated that they what to watch for had been reviewed with them and they had the paperwork from the hospital.     Patient's spouse stated that they have a followup appointment with the doctor on Monday.       Follow Up Appointments:   See Goals     Nurse Navigator spoke briefly with patient via telephone call. Patient's spouse stated patient was sleeping then stated she was awake and would take the call.   Patient stated she felt tired Patient spoke with nurse navigator briefly then handed the telephone to her spouse.   Spouse stated they had all the discharge information and would follow up with Dr. Jac Canavanharakan on Monday. As was ending the phone call. Nurse Navigator did related that PCP could be called 24 hours a day if patient or spouse had any concerns or questions. Spouse ended the telephone call.     Goals Addressed      ??? Attends follow-up appointments as directed.                  Plan:  Patient to attend TOC and follow up appointments as scheduled or reschedule appointments as needed.     07-29-16:   -PCP follow up appointment scheduled for 08-01-16 @ 1:15.    - Patient to follow up with Dr. Faythe GheeNaveen Akkina within 2 weeks.  [phone]. Briefly discussed follow up with patient. Patient stated, " I just got out of the hospital".                  Nurse Navigator will follow case.

## 2016-08-01 ENCOUNTER — Ambulatory Visit: Admit: 2016-08-01 | Discharge: 2016-08-01 | Payer: MEDICARE | Attending: Family Medicine | Primary: Physician Assistant

## 2016-08-01 DIAGNOSIS — J45901 Unspecified asthma with (acute) exacerbation: Secondary | ICD-10-CM

## 2016-08-01 MED ORDER — QUETIAPINE SR 150 MG 24 HR TAB
150 mg | ORAL_TABLET | ORAL | 1 refills | Status: DC
Start: 2016-08-01 — End: 2016-08-22

## 2016-08-01 MED ORDER — KETOROLAC TROMETHAMINE 30 MG/ML INJECTION
30 mg/mL (1 mL) | Freq: Once | INTRAMUSCULAR | 0 refills | Status: AC
Start: 2016-08-01 — End: 2016-08-01

## 2016-08-01 MED ORDER — GABAPENTIN 600 MG TAB
600 mg | ORAL_TABLET | ORAL | 2 refills | Status: DC
Start: 2016-08-01 — End: 2017-02-22

## 2016-08-01 MED ORDER — ALBUTEROL SULFATE HFA 90 MCG/ACTUATION AEROSOL INHALER
90 mcg/actuation | RESPIRATORY_TRACT | 2 refills | Status: DC | PRN
Start: 2016-08-01 — End: 2017-04-15

## 2016-08-01 NOTE — Progress Notes (Signed)
Chief Complaint   Patient presents with   ??? Transitions Of Care     follow up visit hospital     1. Have you been to the ER, urgent care clinic since your last visit?  Hospitalized since your last visit?Yes When: 13feb2018 [facility] Reason for visit: asthma    2. Have you seen or consulted any other health care providers outside of the [facility] since your last visit?  Include any pap smears or colon screening. No

## 2016-08-01 NOTE — Progress Notes (Signed)
Transitions Of Care (follow up visit hospital)        HPI: [name] [name] is a 66 y.o. female [place]  Here for hospital follow up at [facility] from 2/10-2/14 for asthma exacerbation. She was found to have elevated WBC and lactic acid on admission. Chest CT and x-ray were negative for acute pulmonary process.  She was started on steroids and bronchodilateros. Her lactic acidosis did resolve. She is feeling well. Denies SOB, fevers, cough, sputum production.      She reports her right hip is very painful today. This is a chronic problem. She sees Dr Leron Croak for pain management      Past Medical History:   Diagnosis Date   ??? Arthritis    ??? Chronic pain    ??? Diabetes ([place])    ??? GERD (gastroesophageal reflux disease)    ??? Hypertension    ??? Liver disease     HEP C   ??? PTSD (post-traumatic stress disorder)     lived through the [place] in 1993       Current Outpatient Prescriptions   Medication Sig   ??? QUEtiapine SR (SEROQUEL XR) 150 mg sr tablet TAKE 1 TABLET BY MOUTH NIGHTLY   ??? gabapentin (NEURONTIN) 600 mg tablet TAKE 1 TABLET BY MOUTH THREE TIMES DAILY   ??? albuterol (PROVENTIL HFA, VENTOLIN HFA, PROAIR HFA) 90 mcg/actuation inhaler Take 2 Puffs by inhalation every four (4) hours as needed for Wheezing.   ??? predniSONE (DELTASONE) 10 mg tablet Take 3 Tabs by mouth daily (with breakfast). 30 mg daily x2, 20 mg daily x3, 10 mg daily x3   ??? amLODIPine (NORVASC) 10 mg tablet TAKE ONE TABLET BY MOUTH ONCE DAILY   ??? rosuvastatin (CRESTOR) 5 mg tablet Take 1 Tab by mouth nightly.   ??? metFORMIN ER (GLUCOPHAGE XR) 750 mg tablet TAKE 2 TABLETS EVERY DAY   ??? traZODone (DESYREL) 100 mg tablet Take 1 Tab by mouth nightly.   ??? cloNIDine HCl (CATAPRES) 0.3 mg tablet TAKE ONE TABLET BY MOUTH TWICE DAILY   ??? magnesium oxide (MAG-OX) 400 mg tablet TAKE 1 TABLET BY MOUTH DAILY   ??? spironolactone (ALDACTONE) 100 mg tablet Take 1 Tab by mouth daily.    ??? losartan (COZAAR) 100 mg tablet Take 1 Tab by mouth daily. Indications: hypertension   ??? Blood-Glucose Meter monitoring kit Check sugars 2 times per day, fasting and 2 hours after dinner   ??? Lancets misc Check sugars twice daily   ??? glucose blood VI test strips (ASCENSIA AUTODISC VI, ONE TOUCH ULTRA TEST VI) strip Check sugars twice daily     No current facility-administered medications for this visit.        Allergies   Allergen Reactions   ??? Chocolate [Cocoa] Sneezing       Past Surgical History:   Procedure Laterality Date   ??? HX ENDOSCOPY     ??? HX GYN  2010    hysterectomy   ??? HX HIP REPLACEMENT Right 01/29/2015   ??? HX HIP REPLACEMENT         Family History   Problem Relation Age of Onset   ??? Hypertension Mother    ??? Cancer Maternal Aunt    ??? Alcohol abuse Maternal Grandfather        Social History     Social History   ??? Marital status: MARRIED     Spouse name: N/A   ??? Number of children: N/A   ???  Years of education: N/A     Occupational History   ??? Not on file.     Social History Main Topics   ??? Smoking status: Never Smoker   ??? Smokeless tobacco: Never Used   ??? Alcohol use No   ??? Drug use: No   ??? Sexual activity: No     Other Topics Concern   ??? Not on file     Social History Narrative       Gen- No weight loss, No Malaise, No fatigue  Eyes- No dipoplia, blurry vision  CVS- No Chest pain, no palpitations, no edema  Resp- No Cough, SOB, DOE, Wheezing  Neuro- No headaches  Skin- No easy bruising, No rashes      Visit Vitals   ??? BP 121/86 (BP 1 Location: Left arm, BP Patient Position: Sitting)   ??? Pulse 82   ??? Temp 97.8 ??F (36.6 ??C) (Oral)   ??? Resp 16   ??? Ht _0  (1.702 m)   ??? Wt 195 lb (88.5 kg)   ??? SpO2 99%  Comment: ra   ??? BMI 30.54 kg/m2       GEN- NAD, AAOx3  CVS- RRR, +S1, +S2, No Murmurs, No JVD  PULM- CTABL, No W/R/R  EXT- No edema  NEURO-Normal Gait  PSYCH- Euthymic, normal afffect        Assesment:  1. Asthma with acute exacerbation, unspecified asthma severity, unspecified whether persistent   Resolved. Continue albuterol inhaler PRN  - albuterol (PROVENTIL HFA, VENTOLIN HFA, PROAIR HFA) 90 mcg/actuation inhaler; Take 2 Puffs by inhalation every four (4) hours as needed for Wheezing.  Dispense: 1 Inhaler; Refill: 2    2. Chronic pain syndrome  Patient provide toradol shot today in office. Follow up with Dr Leron Croak as scheduled  - ketorolac (TORADOL) 30 mg/mL (1 mL) injection; 2 mL by IntraMUSCular route once for 1 dose.  Dispense: 2 Vial; Refill: 0    3. Medication refill    - QUEtiapine SR (SEROQUEL XR) 150 mg sr tablet; TAKE 1 TABLET BY MOUTH NIGHTLY  Dispense: 90 Tab; Refill: 1  - gabapentin (NEURONTIN) 600 mg tablet; TAKE 1 TABLET BY MOUTH THREE TIMES DAILY  Dispense: 270 Tab; Refill: 2      I have discussed the diagnosis with the patient and the intended management  The patient has received an after-visit summary and questions were answered concerning future plans.  I have discussed medication usage, side effects and warnings with the patient as well.I have reviewed the plan of care with the patient, accepted their input and they are in agreement with the treatment goals.             Lake Bells, MD                .

## 2016-08-04 ENCOUNTER — Encounter

## 2016-08-05 ENCOUNTER — Encounter

## 2016-08-05 NOTE — Telephone Encounter (Signed)
Last Office Visit- 08/01/2016  Next Office Visit-09/07/2016  Last refill- 02/10/2016 Losartan 100mg : 90 pills: 0 Refills given

## 2016-08-08 MED ORDER — LOSARTAN 100 MG TAB
100 mg | ORAL_TABLET | Freq: Every day | ORAL | 2 refills | Status: DC
Start: 2016-08-08 — End: 2017-07-13

## 2016-08-08 MED ORDER — SPIRONOLACTONE 100 MG TAB
100 mg | ORAL_TABLET | ORAL | 0 refills | Status: DC
Start: 2016-08-08 — End: 2016-11-03

## 2016-08-12 ENCOUNTER — Encounter

## 2016-08-12 MED ORDER — TRAZODONE 100 MG TAB
100 mg | ORAL_TABLET | ORAL | 0 refills | Status: DC
Start: 2016-08-12 — End: 2016-11-08

## 2016-08-15 ENCOUNTER — Emergency Department: Admit: 2016-08-16 | Payer: MEDICARE | Primary: Physician Assistant

## 2016-08-15 DIAGNOSIS — F12188 Cannabis abuse with other cannabis-induced disorder: Secondary | ICD-10-CM

## 2016-08-15 NOTE — Progress Notes (Signed)
Preliminary Code R note  1 st admit  Dc  07/27/2016,  [place],  [address] [place] [address]  [place]x  Lactic acidosis, sepsis, asthma exacerbation. Leukocytosis.  Dispo  [facility]  [facility]

## 2016-08-15 NOTE — ED Provider Notes (Signed)
[facility]  Emergency Department Treatment Report        Patient: [name] [name] Age: 66 y.o. Sex: female    Date of Birth: Oct 28, 1950 Admit Date: 08/15/2016 PCP: Lake Bells, MD   MRN: [id number]  CSN: [id number]  Attending: Lennart Pall, MD   Room: ER26/ER26 Time Dictated: 10:57 PM APP: Raquel James, PA-C     Chief Complaint   Chief Complaint   Patient presents with   ??? Abdominal Pain   ??? Vomiting       History of Present Illness   66 y.o. female with history of diabetes, GERD, hypertension, and COPD who presents for evaluation of epigastric abdominal pain that started today.  Patient starts her pain was gradual in onset around 11 AM today.  This pain as a burning ache in her epigastric area that does not radiate.  It has progressed to be more severe and more constant pain.  Shortly after the onset, patient had nausea and vomited once at home.  She has been taking Zofran with relief of the nausea and vomiting.  She is not taking anything for the pain.  Patient has had no fevers or chills.  Patient is over all a poor historian at this time secondary to her discomfort, she does state that she has had similar pain in the past, she has had an endoscopy but is unable to tell me the results of this.  She does not take anything for her GERD daily.  Denies associated diarrhea, no black or bloody stools, no hematemesis.  Patient has had no irritative voiding symptoms.  She denies associated chest pain, denies shortness of breath or difficulty breathing.    Review of Systems   Review of Systems   Constitutional: Negative for chills and fever.   HENT: Negative for congestion and sore throat.    Eyes: Negative for blurred vision and double vision.   Respiratory: Negative for cough, sputum production, shortness of breath and wheezing.    Cardiovascular: Negative for chest pain and leg swelling.   Gastrointestinal: Positive for abdominal pain (epigastric pain), nausea  and vomiting. Negative for blood in stool and diarrhea.   Genitourinary: Negative for dysuria, frequency and urgency.   Musculoskeletal: Negative for back pain.   Skin: Negative for rash.   Neurological: Negative for tingling, sensory change and focal weakness.     Past Medical/Surgical History     Past Medical History:   Diagnosis Date   ??? Arthritis    ??? Chronic pain    ??? Diabetes ([place])    ??? GERD (gastroesophageal reflux disease)    ??? Hypertension    ??? Liver disease     HEP C   ??? PTSD (post-traumatic stress disorder)     lived through the [place] in 1993        Past Surgical History:   Procedure Laterality Date   ??? HX ENDOSCOPY     ??? HX GYN  2010    hysterectomy   ??? HX HIP REPLACEMENT Right 01/29/2015   ??? HX HIP REPLACEMENT         Social History     Social History     Social History   ??? Marital status: MARRIED     Spouse name: N/A   ??? Number of children: N/A   ??? Years of education: N/A     Occupational History   ??? Not on file.     Social History  Main Topics   ??? Smoking status: Never Smoker   ??? Smokeless tobacco: Never Used   ??? Alcohol use No   ??? Drug use: No   ??? Sexual activity: No     Other Topics Concern   ??? Not on file     Social History Narrative       Family History     Family History   Problem Relation Age of Onset   ??? Hypertension Mother    ??? Cancer Maternal Aunt    ??? Alcohol abuse Maternal Grandfather        Current Medications     (Not in a hospital admission)  Prior to Admission Medications   Prescriptions Last Dose Informant Patient Reported? Taking?   Blood-Glucose Meter monitoring kit   No Yes   Sig: Check sugars 2 times per day, fasting and 2 hours after dinner   Lancets misc   No No   Sig: Check sugars twice daily   QUEtiapine SR (SEROQUEL XR) 150 mg sr tablet   No Yes   Sig: TAKE 1 TABLET BY MOUTH NIGHTLY   albuterol (PROVENTIL HFA, VENTOLIN HFA, PROAIR HFA) 90 mcg/actuation inhaler   No Yes   Sig: Take 2 Puffs by inhalation every four (4) hours as needed for Wheezing.    amLODIPine (NORVASC) 10 mg tablet   No Yes   Sig: TAKE ONE TABLET BY MOUTH ONCE DAILY   cloNIDine HCl (CATAPRES) 0.3 mg tablet   No Yes   Sig: TAKE ONE TABLET BY MOUTH TWICE DAILY   gabapentin (NEURONTIN) 600 mg tablet   No Yes   Sig: TAKE 1 TABLET BY MOUTH THREE TIMES DAILY   glucose blood VI test strips (ASCENSIA AUTODISC VI, ONE TOUCH ULTRA TEST VI) strip   No No   Sig: Check sugars twice daily   losartan (COZAAR) 100 mg tablet   No Yes   Sig: Take 1 Tab by mouth daily. Indications: hypertension   magnesium oxide (MAG-OX) 400 mg tablet   No Yes   Sig: TAKE 1 TABLET BY MOUTH DAILY   metFORMIN ER (GLUCOPHAGE XR) 750 mg tablet   No Yes   Sig: TAKE 2 TABLETS EVERY DAY   oxyCODONE-acetaminophen (PERCOCET) 10-325 mg per tablet   Yes Yes   Sig: Take 1 Tab by mouth every six (6) hours as needed for Pain. Takes for chronic hip pain   predniSONE (DELTASONE) 10 mg tablet Not Taking at Unknown time  No No   Sig: Take 3 Tabs by mouth daily (with breakfast). 30 mg daily x2, 20 mg daily x3, 10 mg daily x3   rosuvastatin (CRESTOR) 5 mg tablet   No Yes   Sig: Take 1 Tab by mouth nightly.   spironolactone (ALDACTONE) 100 mg tablet   No Yes   Sig: TAKE 1 TABLET BY MOUTH DAILY   traZODone (DESYREL) 100 mg tablet   No Yes   Sig: TAKE 1 TABLET BY MOUTH NIGHTLY      Facility-Administered Medications: None     Allergies     Allergies   Allergen Reactions   ??? Chocolate [Cocoa] Sneezing       Physical Exam   ED Triage Vitals   Enc Vitals Group      BP 08/15/16 2117 141/99      Pulse (Heart Rate) 08/15/16 2117 117      Resp Rate 08/15/16 2117 22      Temp 08/15/16 2117 98.6 ??F (37 ??C)  Temp src --       O2 Sat (%) 08/15/16 2117 97 %      Weight 08/15/16 2117 185 lb      Height 08/15/16 2117 5' 8"      Physical Exam   Constitutional: She is oriented to person, place, and time.   Nontoxic appearing female lying on the stretcher    HENT:   Head: Normocephalic and atraumatic.   Nose: Nose normal.    Mouth/Throat: Oropharynx is clear and moist.   Eyes: Conjunctivae are normal.   Neck: Normal range of motion. Neck supple.   Cardiovascular: Regular rhythm, normal heart sounds and intact distal pulses.  Exam reveals no gallop and no friction rub.    No murmur heard.  Tachycardic rate, regular rhythm   Pulmonary/Chest: Effort normal and breath sounds normal. No respiratory distress. She has no wheezes. She has no rales.   Abdominal: Soft. Bowel sounds are normal. She exhibits no distension. There is tenderness (epigastric tenderness ). There is no rebound and no guarding.   Musculoskeletal: Normal range of motion.   Neurological: She is alert and oriented to person, place, and time. GCS score is 15.   Skin: Skin is warm and dry.   Nursing note and vitals reviewed.    Impression and Management Plan   66 year old female with history of diabetes, GERD, hypertension, and COPD who presents for evaluation of epigastric abdominal pain with associated nausea/vomiting that started today.  On arrival patient is nontoxic appearing but does appear uncomfortable.  She is afebrile, she is hypertensive and tachycardic.  Abdomen is soft but does have tenderness in the epigastric area.  Symptoms seem most consistent with a gastritis however cannot rule out other acute intra-abdominal process, she will need CT scan of the abdomen and pelvis with contrast.  We will start hydration with IV fluids, we will then start IV Pepcid, IV Zofran and by mouth Carafate.  We will obtain a baseline EKG secondary to her tachycardia, do not feel that her epigastric pain is an anginal equivalent, her pain is reproducible on exam with palpation of the epigastrium.     Diagnostic Studies   Lab:   Recent Results (from the past 12 hour(s))   CBC WITH AUTOMATED DIFF    Collection Time: 08/15/16 10:03 PM   Result Value Ref Range    WBC 14.7 (H) 4.0 - 11.0 1000/mm3    RBC 4.93 3.60 - 5.20 M/uL    HGB 14.7 13.0 - 17.2 gm/dl    HCT 42.4 37.0 - 50.0 %     MCV 86.0 80.0 - 98.0 fL    MCH 29.8 25.4 - 34.6 pg    MCHC 34.7 30.0 - 36.0 gm/dl    PLATELET 254 140 - 450 1000/mm3    MPV 11.2 (H) 6.0 - 10.0 fL    RDW-SD 40.5 36.4 - 46.3      NRBC 0 0 - 0      IMMATURE GRANULOCYTES 0.4 0.0 - 3.0 %    NEUTROPHILS 88.3 (H) 34 - 64 %    LYMPHOCYTES 8.4 (L) 28 - 48 %    MONOCYTES 2.7 1 - 13 %    EOSINOPHILS 0.0 0 - 5 %    BASOPHILS 0.2 0 - 3 %   METABOLIC PANEL, COMPREHENSIVE    Collection Time: 08/15/16 10:03 PM   Result Value Ref Range    Sodium 133 (L) 136 - 145 mEq/L    Potassium 3.8 3.5 - 5.1 mEq/L  Chloride 98 98 - 107 mEq/L    CO2 23 21 - 32 mEq/L    Glucose 171 (H) 74 - 106 mg/dl    BUN 13 7 - 25 mg/dl    Creatinine 0.9 0.6 - 1.3 mg/dl    GFR est AA >60.0      GFR est non-AA >60      Calcium 10.3 (H) 8.5 - 10.1 mg/dl    AST (SGOT) 9 (L) 15 - 37 U/L    ALT (SGPT) 13 12 - 78 U/L    Alk. phosphatase 90 45 - 117 U/L    Bilirubin, total 0.5 0.2 - 1.0 mg/dl    Protein, total 10.1 (H) 6.4 - 8.2 gm/dl    Albumin 4.1 3.4 - 5.0 gm/dl   LIPASE    Collection Time: 08/15/16 10:03 PM   Result Value Ref Range    Lipase 75 73 - 393 U/L     Labs Reviewed   CBC WITH AUTOMATED DIFF - Abnormal; Notable for the following:        Result Value    WBC 14.7 (*)     MPV 11.2 (*)     NEUTROPHILS 88.3 (*)     LYMPHOCYTES 8.4 (*)     All other components within normal limits   METABOLIC PANEL, COMPREHENSIVE - Abnormal; Notable for the following:     Sodium 133 (*)     Glucose 171 (*)     Calcium 10.3 (*)     AST (SGOT) 9 (*)     Protein, total 10.1 (*)     All other components within normal limits   LIPASE     EKG: sinus tachycardia, no signs of acute ischemia or infarct per ED attending.    EXAM:  CT Abdomen and Pelvis With Intravenous Contrast  EXAM DATE/TIME:  08/15/2016 11:25 PM  CLINICAL HISTORY:  66 years old, female; Pain; Abdominal pain; Epigastric; Patient HX: C/O n/v since this am, epigastric  pain, h/o diabetes.  TECHNIQUE:  Axial computed tomography images of the abdomen and pelvis with  intravenous contrast.  Coronal and sagittal reformatted images were created and reviewed.  CONTRAST:  85 mL of [id number] administered intravenously.  COMPARISON:  No relevant prior studies available.  FINDINGS:  Lower thorax: Multiple blebs seen at each lung base. Noncalcified, 1.2 cm nodule left lower lobe.  This nodule is unchanged when compared to prior CT.  ABDOMEN:  Liver: Normal. No mass.  Gallbladder and bile ducts: Normal. No calcified stones. No ductal dilation.  Pancreas: Normal. No mass. No ductal dilation.  Spleen: Probable small splenic cyst noted. This finding is of no clinical significance.  Adrenals: Normal. No mass.  Kidneys and ureters: Small bilateral intrarenal calculi. The largest of these intrarenal calculi  measures 5 mm and is seen in the lower pole the right kidney.  Stomach and bowel: Mucosal thickening of the gastric antrum is noted. This suggests mild gastritis.  Colonic diverticulosis. No evidence of diverticulitis. Significant amount of stool is seen throughout  the colon.  Appendix: A normal appendix is identified.  PELVIS:  Bladder: Normal. No mass.  Reproductive: Unremarkable as visualized.  ABDOMEN and PELVIS:  Intraperitoneal space: Normal. No free air. No significant fluid collection.  Bones/joints: Status post right total hip arthroplasty. No acute fracture. No dislocation.  Soft tissues: Normal.  Vasculature: Normal. No abdominal aortic aneurysm.  Lymph nodes: Normal. No enlarged lymph nodes.  IMPRESSION:  Possible mild gastritis. Recommend clinical correlation.  There is  no evidence of appendicitis, colitis, bowel obstruction or bowel perforation.  Small bilateral intrarenal calculi. No hydronephrosis secondary to these kidney stones.  Significant amount of stool is seen throughout the colon. Findings may represent constipation.    ED Course/Medical Decision Making   Patient was started on IV fluids, she was given IV Zofran, IV Pepcid and  Carafate orally.  Labs revealed a mild leukocytosis of 14.7, her lipase is normal.  Leukocytosis most likely from recent steroid use vs reactive from her recent vomiting.  Lactate is minimally elevated at 2.2.  Low suspicion for acute infectious process.  She had some persistent epigastric pain and was given IV morphine 6 mg.  Patient's CT of abdomen and pelvis revealed mild gastritis as we suspected.  There is no signs of acute infectious process, bowel obstruction or perforation.  I went in to discuss these findings with the patient, patient states she is still extremely uncomfortable and does not wish to go home.  Due to her intractable pain and nausea along with her slight lactic acidosis I feel it is reasonable she be admitted for observation for further hydration and treatment of her gastritis. I spoke with Dr. Lynnette Caffey who agrees to admit to obs tele. Prior to admission, we have added chest xray and urinalysis to evaluate for possible infectious source due to her SIRS criteria findings. Another liter of NS fluids was ordered, we will also recheck her lactic at 3A and check a procalcitonin. I spoke with the patient and her husband about this plan, they are in agreement.    Final Diagnosis       ICD-10-CM ICD-9-CM   1. Acute gastritis, presence of bleeding unspecified, unspecified gastritis type K29.00 535.00   2. Nausea and vomiting, intractability of vomiting not specified, unspecified vomiting type R11.2 787.01   3. Lactic acidosis E87.2 276.2       Disposition   Admitted     Jarold Song, PA-C  August 15, 2016  10:57 PM    The patient was personally evaluated by myself and Dr. Lennart Pall, MD who agrees with the above assessment and plan.  Please note that portions of this note were completed with a voice recognition program. Efforts were made to edit the dictations but occasionally words are mis-transcribed.   My signature above authenticates this document and my orders, the final ??   diagnosis (es), discharge prescription (s), and instructions in the Epic ??  record.  If you have any questions please contact [phone].  ??  Nursing notes have been reviewed by the physician/ advanced practice ??  Clinician.

## 2016-08-15 NOTE — ED Triage Notes (Signed)
C/o n/v since this am, abd pain, pt appears uncomfortable, h/o diabetes.

## 2016-08-16 ENCOUNTER — Observation Stay

## 2016-08-16 ENCOUNTER — Emergency Department: Admit: 2016-08-16 | Payer: MEDICARE | Primary: Physician Assistant

## 2016-08-16 ENCOUNTER — Inpatient Hospital Stay
Admit: 2016-08-16 | Discharge: 2016-08-16 | Disposition: A | Discharge status: Home / Self Care | Payer: MEDICARE | Attending: Emergency Medicine

## 2016-08-16 LAB — EKG 12-LEAD
Atrial Rate: 107 {beats}/min
P Axis: 75 degrees
P-R Interval: 174 ms
Q-T Interval: 320 ms
QRS Duration: 74 ms
QTc Calculation (Bazett): 427 ms
R Axis: 38 degrees
T Axis: 44 degrees
Ventricular Rate: 107 {beats}/min

## 2016-08-16 LAB — DRUG SCREEN, URINE
Amphetamine: NEGATIVE
Barbiturates: NEGATIVE
Benzodiazepines: NEGATIVE
Cocaine: NEGATIVE
Marijuana: POSITIVE — AB
Methadone: NEGATIVE
Opiates: POSITIVE — AB
Phencyclidine: NEGATIVE

## 2016-08-16 LAB — GLUCOSE, POC
Glucose (POC): 200 mg/dL — ABNORMAL HIGH (ref 65–105)
Glucose (POC): 194 mg/dL — ABNORMAL HIGH (ref 65–105)
Glucose (POC): 132 mg/dL — ABNORMAL HIGH (ref 65–105)

## 2016-08-16 LAB — CBC WITH AUTOMATED DIFF
BASOPHILS: 0.2 % (ref 0–3)
EOSINOPHILS: 0 % (ref 0–5)
HCT: 42.4 % (ref 37.0–50.0)
HGB: 14.7 gm/dl (ref 13.0–17.2)
IMMATURE GRANULOCYTES: 0.4 % (ref 0.0–3.0)
LYMPHOCYTES: 8.4 % — ABNORMAL LOW (ref 28–48)
MCH: 29.8 pg (ref 25.4–34.6)
MCHC: 34.7 gm/dl (ref 30.0–36.0)
MCV: 86 fL (ref 80.0–98.0)
MONOCYTES: 2.7 % (ref 1–13)
MPV: 11.2 fL — ABNORMAL HIGH (ref 6.0–10.0)
NEUTROPHILS: 88.3 % — ABNORMAL HIGH (ref 34–64)
NRBC: 0 (ref 0–0)
PLATELET: 254 10*3/uL (ref 140–450)
RBC: 4.93 M/uL (ref 3.60–5.20)
RDW-SD: 40.5 (ref 36.4–46.3)
WBC: 14.7 10*3/uL — ABNORMAL HIGH (ref 4.0–11.0)

## 2016-08-16 LAB — METABOLIC PANEL, COMPREHENSIVE
ALT (SGPT): 13 U/L (ref 12–78)
AST (SGOT): 9 U/L — ABNORMAL LOW (ref 15–37)
Albumin: 4.1 gm/dl (ref 3.4–5.0)
Alk. phosphatase: 90 U/L (ref 45–117)
BUN: 13 mg/dl (ref 7–25)
Bilirubin, total: 0.5 mg/dl (ref 0.2–1.0)
CO2: 23 mEq/L (ref 21–32)
Calcium: 10.3 mg/dl — ABNORMAL HIGH (ref 8.5–10.1)
Chloride: 98 mEq/L (ref 98–107)
Creatinine: 0.9 mg/dl (ref 0.6–1.3)
GFR est AA: >60
GFR est non-AA: >60
Glucose: 171 mg/dl — ABNORMAL HIGH (ref 74–106)
Potassium: 3.8 mEq/L (ref 3.5–5.1)
Protein, total: 10.1 gm/dl — ABNORMAL HIGH (ref 6.4–8.2)
Sodium: 133 mEq/L — ABNORMAL LOW (ref 136–145)

## 2016-08-16 LAB — EKG, 12 LEAD, INITIAL
Atrial Rate: 107 {beats}/min
Calculated P Axis: 75 degrees
Calculated R Axis: 38 degrees
Calculated T Axis: 44 degrees
P-R Interval: 174 ms
Q-T Interval: 320 ms
QRS Duration: 74 ms
QTC Calculation (Bezet): 427 ms
Ventricular Rate: 107 {beats}/min

## 2016-08-16 LAB — POC URINE MACROSCOPIC
Bilirubin: NEGATIVE
Glucose: NEGATIVE mg/dl
Ketone: 40 mg/dl — AB
Leukocyte Esterase: NEGATIVE
Nitrites: NEGATIVE
Protein: NEGATIVE mg/dl
Specific gravity: 1.01 (ref 1.005–1.030)
Urobilinogen: 0.2 EU/dl (ref 0.0–1.0)
pH (UA): 5.5 (ref 5–9)

## 2016-08-16 LAB — HEMOGLOBIN A1C W/O EAG: Hemoglobin A1c: 7.2 % — ABNORMAL HIGH (ref 4.8–6.0)

## 2016-08-16 LAB — LACTIC ACID
Lactic Acid: 2.2 mmol/L — ABNORMAL HIGH (ref 0.4–2.0)
Lactic Acid: 1.6 mmol/L (ref 0.4–2.0)

## 2016-08-16 LAB — PROCALCITONIN: PROCALCITONIN: <0.05 ng/ml (ref 0.00–0.50)

## 2016-08-16 LAB — LIPASE: Lipase: 75 U/L (ref 73–393)

## 2016-08-16 MED ORDER — FAMOTIDINE (PF) 20 MG/2 ML IV
20 mg/2 mL | INTRAVENOUS | Status: AC
Start: 2016-08-16 — End: 2016-08-15
  Administered 2016-08-16: 03:00:00 via INTRAVENOUS

## 2016-08-16 MED ORDER — PROMETHAZINE IN NS 25 MG/50 ML IV PIGGY BAG
25 mg/50 ml | INTRAVENOUS | Status: DC
Start: 2016-08-16 — End: 2016-08-15
  Administered 2016-08-16: 04:00:00 via INTRAVENOUS

## 2016-08-16 MED ORDER — TRAZODONE 50 MG TAB
50 mg | Freq: Every evening | ORAL | Status: DC
Start: 2016-08-16 — End: 2016-08-17
  Administered 2016-08-17: 03:00:00 via ORAL

## 2016-08-16 MED ORDER — DEXTROSE 50% IN WATER (D50W) IV SYRG
INTRAVENOUS | Status: DC | PRN
Start: 2016-08-16 — End: 2016-08-17

## 2016-08-16 MED ORDER — OXYCODONE-ACETAMINOPHEN 10 MG-325 MG TAB
10-325 mg | Freq: Four times a day (QID) | ORAL | Status: DC | PRN
Start: 2016-08-16 — End: 2016-08-17
  Administered 2016-08-16: 14:00:00 via ORAL

## 2016-08-16 MED ORDER — MORPHINE 10 MG/ML INJ SOLUTION
10 mg/ml | INTRAMUSCULAR | Status: AC
Start: 2016-08-16 — End: 2016-08-15
  Administered 2016-08-16: 05:00:00 via INTRAVENOUS

## 2016-08-16 MED ORDER — SUCRALFATE 100 MG/ML ORAL SUSP
100 mg/mL | ORAL | Status: AC
Start: 2016-08-16 — End: 2016-08-15
  Administered 2016-08-16: 03:00:00 via ORAL

## 2016-08-16 MED ORDER — GLUCAGON 1 MG INJECTION
1 mg | INTRAMUSCULAR | Status: DC | PRN
Start: 2016-08-16 — End: 2016-08-17

## 2016-08-16 MED ORDER — QUETIAPINE SR 50 MG 24 HR TAB
50 mg | Freq: Every evening | ORAL | Status: DC
Start: 2016-08-16 — End: 2016-08-17
  Administered 2016-08-17: 03:00:00 via ORAL

## 2016-08-16 MED ORDER — ONDANSETRON (PF) 4 MG/2 ML INJECTION
4 mg/2 mL | Freq: Once | INTRAMUSCULAR | Status: AC
Start: 2016-08-16 — End: 2016-08-15
  Administered 2016-08-16: 03:00:00 via INTRAVENOUS

## 2016-08-16 MED ORDER — AMLODIPINE 5 MG TAB
5 mg | Freq: Every day | ORAL | Status: DC
Start: 2016-08-16 — End: 2016-08-17
  Administered 2016-08-16 – 2016-08-17 (×3): via ORAL

## 2016-08-16 MED ORDER — PROMETHAZINE 25 MG/ML INJECTION
25 mg/mL | Freq: Four times a day (QID) | INTRAMUSCULAR | Status: DC | PRN
Start: 2016-08-16 — End: 2016-08-17
  Administered 2016-08-16: 14:00:00 via INTRAMUSCULAR

## 2016-08-16 MED ORDER — AMLODIPINE 5 MG TAB
5 mg | Freq: Every day | ORAL | Status: DC
Start: 2016-08-16 — End: 2016-08-16

## 2016-08-16 MED ORDER — IOPAMIDOL 76 % IV SOLN
370 mg iodine /mL (76 %) | Freq: Once | INTRAVENOUS | Status: AC
Start: 2016-08-16 — End: 2016-08-15
  Administered 2016-08-16: 04:00:00 via INTRAVENOUS

## 2016-08-16 MED ORDER — NALOXONE 0.4 MG/ML INJECTION
0.4 mg/mL | INTRAMUSCULAR | Status: DC | PRN
Start: 2016-08-16 — End: 2016-08-17
  Administered 2016-08-16: 23:00:00 via INTRAVENOUS

## 2016-08-16 MED ORDER — SODIUM CHLORIDE 0.9% BOLUS IV
0.9 % | INTRAVENOUS | Status: AC
Start: 2016-08-16 — End: 2016-08-15
  Administered 2016-08-16: 03:00:00 via INTRAVENOUS

## 2016-08-16 MED ORDER — GABAPENTIN 300 MG CAP
300 mg | Freq: Three times a day (TID) | ORAL | Status: DC
Start: 2016-08-16 — End: 2016-08-17
  Administered 2016-08-16 – 2016-08-17 (×3): via ORAL

## 2016-08-16 MED ORDER — INSULIN LISPRO 100 UNIT/ML INJECTION
100 unit/mL | Freq: Four times a day (QID) | SUBCUTANEOUS | Status: DC
Start: 2016-08-16 — End: 2016-08-17
  Administered 2016-08-16 – 2016-08-17 (×3): via SUBCUTANEOUS

## 2016-08-16 MED ORDER — MORPHINE 10 MG/ML INJ SOLUTION
10 mg/ml | INTRAMUSCULAR | Status: AC
Start: 2016-08-16 — End: 2016-08-16
  Administered 2016-08-16: 07:00:00 via INTRAVENOUS

## 2016-08-16 MED ORDER — PROMETHAZINE 25 MG/ML INJECTION
25 mg/mL | Freq: Once | INTRAMUSCULAR | Status: AC
Start: 2016-08-16 — End: 2016-08-15
  Administered 2016-08-16: 05:00:00 via INTRAMUSCULAR

## 2016-08-16 MED ORDER — SODIUM CHLORIDE 0.9 % IJ SYRG
Freq: Once | INTRAMUSCULAR | Status: AC
Start: 2016-08-16 — End: 2016-08-15
  Administered 2016-08-16: 03:00:00 via INTRAVENOUS

## 2016-08-16 MED ORDER — SODIUM CHLORIDE 0.9% BOLUS IV
0.9 % | INTRAVENOUS | Status: AC
Start: 2016-08-16 — End: 2016-08-16
  Administered 2016-08-16: 07:00:00 via INTRAVENOUS

## 2016-08-16 MED ORDER — POLYETHYLENE GLYCOL 3350 17 GRAM (100 %) ORAL POWDER PACKET
17 gram | Freq: Every day | ORAL | Status: DC
Start: 2016-08-16 — End: 2016-08-17
  Administered 2016-08-16 – 2016-08-17 (×2): via ORAL

## 2016-08-16 MED ORDER — ONDANSETRON (PF) 4 MG/2 ML INJECTION
4 mg/2 mL | Freq: Four times a day (QID) | INTRAMUSCULAR | Status: DC | PRN
Start: 2016-08-16 — End: 2016-08-17
  Administered 2016-08-16: 11:00:00 via INTRAVENOUS

## 2016-08-16 MED ORDER — SODIUM CHLORIDE 0.9 % IJ SYRG
INTRAMUSCULAR | Status: DC | PRN
Start: 2016-08-16 — End: 2016-08-17
  Administered 2016-08-16: 22:00:00 via INTRAVENOUS

## 2016-08-16 MED ORDER — PROMETHAZINE IN NS 12.5 MG/50 ML IV PIGGY BAG
12.5 mg/50 ml | Freq: Four times a day (QID) | INTRAVENOUS | Status: DC | PRN
Start: 2016-08-16 — End: 2016-08-16

## 2016-08-16 MED ORDER — SODIUM CHLORIDE 0.9 % IV
INTRAVENOUS | Status: DC
Start: 2016-08-16 — End: 2016-08-17
  Administered 2016-08-16 – 2016-08-17 (×3): via INTRAVENOUS

## 2016-08-16 MED ORDER — CLONIDINE 0.1 MG TAB
0.1 mg | Freq: Two times a day (BID) | ORAL | Status: DC
Start: 2016-08-16 — End: 2016-08-16

## 2016-08-16 MED ORDER — LOSARTAN 50 MG TAB
50 mg | Freq: Every day | ORAL | Status: DC
Start: 2016-08-16 — End: 2016-08-17
  Administered 2016-08-16 – 2016-08-17 (×3): via ORAL

## 2016-08-16 MED ORDER — SODIUM CHLORIDE 0.9 % IJ SYRG
Freq: Three times a day (TID) | INTRAMUSCULAR | Status: DC
Start: 2016-08-16 — End: 2016-08-17
  Administered 2016-08-16 – 2016-08-17 (×3): via INTRAVENOUS

## 2016-08-16 MED ORDER — CLONIDINE 0.1 MG TAB
0.1 mg | Freq: Two times a day (BID) | ORAL | Status: DC
Start: 2016-08-16 — End: 2016-08-17
  Administered 2016-08-16 – 2016-08-17 (×3): via ORAL

## 2016-08-16 MED ORDER — ACETAMINOPHEN 325 MG TABLET
325 mg | ORAL | Status: DC | PRN
Start: 2016-08-16 — End: 2016-08-17
  Administered 2016-08-16: 19:00:00 via ORAL

## 2016-08-16 MED ORDER — SODIUM CHLORIDE 0.9 % INJECTION
40 mg | Freq: Every day | INTRAMUSCULAR | Status: DC
Start: 2016-08-16 — End: 2016-08-17
  Administered 2016-08-16 – 2016-08-17 (×2): via INTRAVENOUS

## 2016-08-16 MED ORDER — HEPARIN (PORCINE) 5,000 UNIT/ML IJ SOLN
5000 unit/mL | Freq: Three times a day (TID) | INTRAMUSCULAR | Status: DC
Start: 2016-08-16 — End: 2016-08-17
  Administered 2016-08-16 – 2016-08-17 (×3): via SUBCUTANEOUS

## 2016-08-16 MED FILL — POLYETHYLENE GLYCOL 3350 17 GRAM (100 %) ORAL POWDER PACKET: 17 gram | ORAL | Qty: 1

## 2016-08-16 MED FILL — CLONIDINE 0.1 MG TAB: 0.1 mg | ORAL | Qty: 3

## 2016-08-16 MED FILL — PANTOPRAZOLE 40 MG IV SOLR: 40 mg | INTRAVENOUS | Qty: 40

## 2016-08-16 MED FILL — FAMOTIDINE (PF) 20 MG/2 ML IV: 20 mg/2 mL | INTRAVENOUS | Qty: 2

## 2016-08-16 MED FILL — GABAPENTIN 300 MG CAP: 300 mg | ORAL | Qty: 1

## 2016-08-16 MED FILL — ISOVUE-370  76 % INTRAVENOUS SOLUTION: 370 mg iodine /mL (76 %) | INTRAVENOUS | Qty: 80

## 2016-08-16 MED FILL — ONDANSETRON (PF) 4 MG/2 ML INJECTION: 4 mg/2 mL | INTRAMUSCULAR | Qty: 2

## 2016-08-16 MED FILL — SUCRALFATE 100 MG/ML ORAL SUSP: 100 mg/mL | ORAL | Qty: 10

## 2016-08-16 MED FILL — AMLODIPINE 5 MG TAB: 5 mg | ORAL | Qty: 2

## 2016-08-16 MED FILL — MORPHINE 10 MG/ML INJ SOLUTION: 10 mg/ml | INTRAMUSCULAR | Qty: 1

## 2016-08-16 MED FILL — ACETAMINOPHEN 325 MG TABLET: 325 mg | ORAL | Qty: 2

## 2016-08-16 MED FILL — HEPARIN (PORCINE) 5,000 UNIT/ML IJ SOLN: 5000 unit/mL | INTRAMUSCULAR | Qty: 1

## 2016-08-16 MED FILL — NALOXONE 0.4 MG/ML INJECTION: 0.4 mg/mL | INTRAMUSCULAR | Qty: 1

## 2016-08-16 MED FILL — PROMETHAZINE 25 MG/ML INJECTION: 25 mg/mL | INTRAMUSCULAR | Qty: 1

## 2016-08-16 MED FILL — LOSARTAN 50 MG TAB: 50 mg | ORAL | Qty: 2

## 2016-08-16 MED FILL — OXYCODONE-ACETAMINOPHEN 10 MG-325 MG TAB: 10-325 mg | ORAL | Qty: 1

## 2016-08-16 MED FILL — SODIUM CHLORIDE 0.9 % INJECTION: INTRAMUSCULAR | Qty: 10

## 2016-08-16 MED FILL — SODIUM CHLORIDE 0.9 % IV: INTRAVENOUS | Qty: 1000

## 2016-08-16 MED FILL — BD POSIFLUSH NORMAL SALINE 0.9 % INJECTION SYRINGE: INTRAMUSCULAR | Qty: 50

## 2016-08-16 NOTE — H&P (Addendum)
Medicine History and Physical    Patient: [name] [name]   Age:  66 y.o.    Assessment / Plan   Intractable nausea / vomitingAcute gastritits  -CT abdomen on 3/5 - 1. Possible mild gastritis.Scattered diverticula of the colon without diverticulitis.Nonobstructing bilateral renal calculi.Constipation.  -Lipase normal  -NS at 100, NPO  -Utox    DM type 2 with diabetic neuropathy  -neurontin  -correctional insulin  -check HbA1C    HLD  -con statins    HTN  -cion home meds    Bipolar  -con home meds    DVT PPX  -Heparin SQ    DISPO  -Pt to be admitted  at this time for reasons addressed above, continued hospitalization for ongoing assessment and treatment indicated     Anticipated Date of Discharge: 1-2 days  Anticipated Disposition (home)    Chief Complaint:   Chief Complaint   Patient presents with   ??? Abdominal Pain   ??? Vomiting         HPI:   [name] [name] is a 66 y.o. year old female with past medical history of hypertension, diabetes mellitus type 2, hyperlipidemia who presents with intractable nausea vomiting for 2 days. Chief complaint of epigastric abdominal pain, burning nonradiating. She denies any diarrhea or constipation. Last bowel movement yesterday. She denies any blood in her stool or vomit. No fevers, or chills. Denies any shortness of breath or chest pain. CT abdomen done in the emergency room unremarkable apart of constipation.      Review of Systems - positive responses in bold type   Constitutional: Negative for fever, chills, diaphoresis and unexpected weight change.   HENT: Negative for ear pain, congestion, sore throat, rhinorrhea, drooling, trouble swallowing, neck pain and tinnitus.   Eyes: Negative for photophobia, pain, redness and visual disturbance.   Respiratory: negative for shortness of breath, cough, choking, chest tightness, wheezing or stridor.   Cardiovascular: Negative for chest pain, palpitations and leg swelling.    Gastrointestinal: Negative for nausea, vomiting, abdominal pain, diarrhea, constipation, blood in stool, abdominal distention and anal bleeding.   Genitourinary: Negative for dysuria, urgency, frequency, hematuria, flank pain and difficulty urinating.   Musculoskeletal: Negative for back pain and arthralgias.   Skin: Negative for color change, rash and wound.   Neurological: Negative for dizziness, seizures, syncope, speech difficulty, light-headedness or headaches.   Hematological: Does not bruise/bleed easily.   Psychiatric/Behavioral: Negative for suicidal ideas, hallucinations, behavioral problems, self-injury or agitation       Past Medical History:  Past Medical History:   Diagnosis Date   ??? Arthritis    ??? Chronic pain    ??? Diabetes ([place])    ??? GERD (gastroesophageal reflux disease)    ??? Hypertension    ??? Liver disease     HEP C   ??? PTSD (post-traumatic stress disorder)     lived through the [place] in 1993       Past Surgical History:  Past Surgical History:   Procedure Laterality Date   ??? HX ENDOSCOPY     ??? HX GYN  2010    hysterectomy   ??? HX HIP REPLACEMENT Right 01/29/2015   ??? HX HIP REPLACEMENT         Family History:  Family History   Problem Relation Age of Onset   ??? Hypertension Mother    ??? Cancer Maternal Aunt    ??? Alcohol abuse Maternal Grandfather  Social History:  No ETOH  No smoking  retired  Social History     Social History   ??? Marital status: MARRIED     Spouse name: N/A   ??? Number of children: N/A   ??? Years of education: N/A     Social History Main Topics   ??? Smoking status: Never Smoker   ??? Smokeless tobacco: Never Used   ??? Alcohol use No   ??? Drug use: No   ??? Sexual activity: No     Other Topics Concern   ??? None     Social History Narrative       Home Medications:  Prior to Admission medications    Medication Sig Start Date End Date Taking? Authorizing Provider   oxyCODONE-acetaminophen (PERCOCET) 10-325 mg per tablet Take 1 Tab by  mouth every six (6) hours as needed for Pain. Takes for chronic hip pain   Yes Phys Other, MD   traZODone (DESYREL) 100 mg tablet TAKE 1 TABLET BY MOUTH NIGHTLY 08/12/16  Yes Lake Bells, MD   spironolactone (ALDACTONE) 100 mg tablet TAKE 1 TABLET BY MOUTH DAILY 08/07/16  Yes Lake Bells, MD   losartan (COZAAR) 100 mg tablet Take 1 Tab by mouth daily. Indications: hypertension 08/07/16  Yes Jolson Dyann Kief, MD   QUEtiapine SR (SEROQUEL XR) 150 mg sr tablet TAKE 1 TABLET BY MOUTH NIGHTLY 08/01/16  Yes Jolson Dyann Kief, MD   gabapentin (NEURONTIN) 600 mg tablet TAKE 1 TABLET BY MOUTH THREE TIMES DAILY 08/01/16  Yes Jolson Dyann Kief, MD   albuterol (PROVENTIL HFA, VENTOLIN HFA, PROAIR HFA) 90 mcg/actuation inhaler Take 2 Puffs by inhalation every four (4) hours as needed for Wheezing. 08/01/16  Yes Jolson Dyann Kief, MD   amLODIPine (NORVASC) 10 mg tablet TAKE ONE TABLET BY MOUTH ONCE DAILY 07/01/16  Yes Jolson Dyann Kief, MD   rosuvastatin (CRESTOR) 5 mg tablet Take 1 Tab by mouth nightly. 06/16/16  Yes Lake Bells, MD   metFORMIN ER (GLUCOPHAGE XR) 750 mg tablet TAKE 2 TABLETS EVERY DAY 06/07/16  Yes Jolson Dyann Kief, MD   cloNIDine HCl (CATAPRES) 0.3 mg tablet TAKE ONE TABLET BY MOUTH TWICE DAILY 05/11/16  Yes Jolson Dyann Kief, MD   magnesium oxide (MAG-OX) 400 mg tablet TAKE 1 TABLET BY MOUTH DAILY 05/10/16  Yes Lake Bells, MD   Blood-Glucose Meter monitoring kit Check sugars 2 times per day, fasting and 2 hours after dinner 07/15/15  Yes Jolson Dyann Kief, MD   predniSONE (DELTASONE) 10 mg tablet Take 3 Tabs by mouth daily (with breakfast). 30 mg daily x2, 20 mg daily x3, 10 mg daily x3 07/28/16   Otila Kluver, MD   Lancets misc Check sugars twice daily 07/15/15   Lake Bells, MD   glucose blood VI test strips (ASCENSIA AUTODISC VI, ONE TOUCH ULTRA TEST VI) strip Check sugars twice daily 07/15/15   Lake Bells, MD       Allergies:  Allergies   Allergen Reactions    ??? Chocolate [Cocoa] Sneezing           Physical Exam:     Visit Vitals   ??? BP (!) 136/100 (BP 1 Location: Left arm, BP Patient Position: Sitting)   ??? Pulse (!) 121   ??? Temp 98.6 ??F (37 ??C)   ??? Resp 19   ??? Ht 5' 8" (1.727 m)   ??? Wt 84 kg (185 lb 3 oz)   ??? SpO2  96%   ??? BMI 28.16 kg/m2       Physical Exam:  General appearance: alert, cooperative, no distress, appears stated age  Head: Normocephalic, without obvious abnormality, atraumatic  Neck: supple, trachea midline  Lungs: clear to auscultation bilaterally  Heart: regular tachycardia, S1, S2 normal, no murmur, click, rub or gallop  Abdomen: soft, tender in epigastric area. Bowel sounds normal. No masses,  no organomegaly  Extremities: extremities normal, atraumatic, no cyanosis or edema  Skin: Skin color, texture, turgor normal. No rashes or lesions  Neurologic: Grossly normal  Lymph: no palpable LAD in cervical or axillary region  PSY: mood and affect normal, appropriately behaved     Lab/Data Reviewed:  Recent Results (from the past 24 hour(s))   CBC WITH AUTOMATED DIFF    Collection Time: 08/15/16 10:03 PM   Result Value Ref Range    WBC 14.7 (H) 4.0 - 11.0 1000/mm3    RBC 4.93 3.60 - 5.20 M/uL    HGB 14.7 13.0 - 17.2 gm/dl    HCT 42.4 37.0 - 50.0 %    MCV 86.0 80.0 - 98.0 fL    MCH 29.8 25.4 - 34.6 pg    MCHC 34.7 30.0 - 36.0 gm/dl    PLATELET 254 140 - 450 1000/mm3    MPV 11.2 (H) 6.0 - 10.0 fL    RDW-SD 40.5 36.4 - 46.3      NRBC 0 0 - 0      IMMATURE GRANULOCYTES 0.4 0.0 - 3.0 %    NEUTROPHILS 88.3 (H) 34 - 64 %    LYMPHOCYTES 8.4 (L) 28 - 48 %    MONOCYTES 2.7 1 - 13 %    EOSINOPHILS 0.0 0 - 5 %    BASOPHILS 0.2 0 - 3 %   METABOLIC PANEL, COMPREHENSIVE    Collection Time: 08/15/16 10:03 PM   Result Value Ref Range    Sodium 133 (L) 136 - 145 mEq/L    Potassium 3.8 3.5 - 5.1 mEq/L    Chloride 98 98 - 107 mEq/L    CO2 23 21 - 32 mEq/L    Glucose 171 (H) 74 - 106 mg/dl    BUN 13 7 - 25 mg/dl    Creatinine 0.9 0.6 - 1.3 mg/dl    GFR est AA >60.0       GFR est non-AA >60      Calcium 10.3 (H) 8.5 - 10.1 mg/dl    AST (SGOT) 9 (L) 15 - 37 U/L    ALT (SGPT) 13 12 - 78 U/L    Alk. phosphatase 90 45 - 117 U/L    Bilirubin, total 0.5 0.2 - 1.0 mg/dl    Protein, total 10.1 (H) 6.4 - 8.2 gm/dl    Albumin 4.1 3.4 - 5.0 gm/dl   LIPASE    Collection Time: 08/15/16 10:03 PM   Result Value Ref Range    Lipase 75 73 - 393 U/L   LACTIC ACID    Collection Time: 08/15/16 11:56 PM   Result Value Ref Range    Lactic Acid 2.2 (H) 0.4 - 2.0 mmol/L   PROCALCITONIN    Collection Time: 08/16/16  2:01 AM   Result Value Ref Range    PROCALCITONIN <0.05 0.00 - 0.50 ng/ml   POC URINE MACROSCOPIC    Collection Time: 08/16/16  2:18 AM   Result Value Ref Range    Glucose Negative NEGATIVE,Negative mg/dl    Bilirubin Negative NEGATIVE,Negative  Ketone 40 (A) NEGATIVE,Negative mg/dl    Specific gravity 1.010 1.005 - 1.030      Blood Trace-lysed (A) NEGATIVE,Negative      pH (UA) 5.5 5 - 9      Protein Negative NEGATIVE,Negative mg/dl    Urobilinogen 0.2 0.0 - 1.0 EU/dl    Nitrites Negative NEGATIVE,Negative      Leukocyte Esterase Negative NEGATIVE,Negative      Color Yellow      Appearance Clear     LACTIC ACID    Collection Time: 08/16/16  3:46 AM   Result Value Ref Range    Lactic Acid 1.6 0.4 - 2.0 mmol/L   GLUCOSE, POC    Collection Time: 08/16/16  3:49 AM   Result Value Ref Range    Glucose (POC) 200 (H) 65 - 105 mg/dL       Chest X-Ray is obtained; CXR reviewed independently -IMPRESSION:   1. No acute cardiopulmonary process.  2. COPD.  3. Unchanged nodular opacity in the left midlung zone.      EKG: tracing reviewed  independently -sinus tachycardia      Collene Schlichter, MD    August 16, 2016  6:42 AM

## 2016-08-16 NOTE — Other (Signed)
TRANSFER - IN REPORT:    Verbal report received from Jane, RN (naErskine Squibbme) on Anne ShutterMonica R Perlow  being received from ED (unit) for routine progression of care      Report consisted of patient???s Situation, Background, Assessment and   Recommendations(SBAR).     Information from the following report(s) SBAR, Kardex, ED Summary, Intake/Output, MAR, Recent Results and Cardiac Rhythm Sinus Tachycardia was reviewed with the receiving nurse.    Opportunity for questions and clarification was provided.      Assessment completed upon patient???s arrival to unit and care assumed.

## 2016-08-16 NOTE — Other (Signed)
Bedside and Verbal shift change report given to L. Clouse-StilesRN  (oncoming nurse) by Maralyn SagoSarah RN (offgoing nurse). Report included the following information SBAR, Kardex, MAR, Recent Results and Med Rec StatusRhythm SR/ST.

## 2016-08-16 NOTE — Other (Signed)
Pt verified with Jose (teletech) Sinus Tachycardia with PAC, HR 117, on box number 74. The box is intact.

## 2016-08-16 NOTE — ED Notes (Signed)
Attempted report, per CN to call back, due to an ongoing code on their floor.

## 2016-08-16 NOTE — Progress Notes (Signed)
Code R Consult Received.    After Hours Consult:      Yes    Last admission dc date and dx          07/27/2016 Lactic acidosis, sepsis, asthma exac, leukocytosis    What was the discharge plan      Home    Now presents        abd pain    Case Discussed with ED Physician:    No    Case Discussed with Attending Physician:     No    Appropriate for Admission:      Yes (obs - gastritis, NV)    Anticipated Discharge Needs:      TBD    Disposition if discharged from the ED      NA    Readmission interview done (epic)     No    Readmission assessment completed (compare AD)    Yes    [facility] complete   Yes (by Junious Silkanya J CM)

## 2016-08-16 NOTE — Progress Notes (Signed)
Notified Dr Trinidad Curetatarkina of [name] [name] persistent nausea  and LUQ abdominal pain. Moaning at bedside

## 2016-08-16 NOTE — Other (Signed)
Bedside and Verbal shift change report given to Levell JulyJames A Akinade, RN   (oncoming nurse) by Mardene SayerLinda Stiles, RN (offgoing nurse). Report included the following information SBAR, Kardex, MAR and Cardiac Rhythm SR, ST.

## 2016-08-16 NOTE — Progress Notes (Signed)
Paged hospitalist for pt's need of prn breathing treatment for wheezing, albuterol concentration 2.5 mg/0.415ml neb solu q 4 h was ordered

## 2016-08-16 NOTE — Progress Notes (Signed)
Ms Barry DienesOwens states she takes percocet at home and desired all PO  medicine since she felt nausea dissipating despite Phenergan IM pending

## 2016-08-16 NOTE — Progress Notes (Signed)
Dr. Josephine IgoMehmood notified since hospitalistTatarkina not on call at this time. Notified of unresponsive episode with RRt called BS and VS WNL, Pupils pinpoint, minimally responsive to tactile rub and verbal stimuli. History of chronic opiode use with recent visit from family members. Narcan 0.4 mg given with return to baseline responsiveness with in 2 minutes. Dr. Josephine IgoMehmood discussed observing closely and if decompensating, CT of head without contrast

## 2016-08-16 NOTE — Progress Notes (Signed)
Patient admitted on 08/15/2016 from ED/home with   Chief Complaint   Patient presents with   ??? Abdominal Pain   ??? Vomiting        The patient has been admitted to the hospital 1 times in the past 12 months.    Previous 4 Admission Dates Admission and Discharge Diagnosis Interventions Barriers Disposition                                   Tentative dc plan:    Home w/family assistance vs HH.  If hh is needed, pt will choose from list provided.    Anticipated Discharge Date:   1 day    PCP: Littie Deeds, MD     Face sheet information, address, contact info and insurance verified Yes    Pharmacy:     [facility] [address] [place]-No problems obtaining meds.    DME at home and provider:    See below    Home Environment:    Lives at [address]  [place] [address]    [phone].     Prior to admission open services:     No    Extended Emergency Contact Information  Primary Emergency Contact: Goodwin,Bobby  Address: [address]           [place], [place] [address] [place]  Home Phone: [phone]  Mobile Phone: [phone]  Relation: Spouse      Transportation:     Spouse will transport home    Therapy Recommendations: TBD    Case Management Assessment    ABUSE/NEGLECT SCREENING   Physical Abuse/Neglect: Denies   Sexual Abuse: Denies   Sexual Abuse: Denies   Other Abuse/Issues: Denies          PRIMARY DECISION MAKER   Primary Decision Maker Name: bobby goodwin   Primary Decision Maker Phone Number: [phone]                           CARE MANAGEMENT INTERVENTIONS       PCP Verified by CM: Yes   Last Visit to PCP:  (2 wks ago)       Mode of Transport at Discharge: Self       Transition of Care Consult (CM Consult): Discharge Planning           MyChart Signup: No   Discharge Durable Medical Equipment: Yes (Chair lift for stairs, walker, cane and b/p machine)   Physical Therapy Consult: No   Occupational Therapy Consult: No   Speech Therapy Consult: No    Current Support Network: Lives with Spouse   Reason for Referral: DCP Rounds   History Provided By: Patient   Patient Orientation: Alert and Oriented   Cognition: Alert   Support System Response: Concerned, Cooperative   Previous Living Arrangement: Lives with Family Dependent   Home Accessibility: Steps, Multi Level Home (20 steps)   Prior Functional Level: Assistance with the following:, Shopping, Housework, Mobility, Cooking   Current Functional Level: Assistance with the following:, Shopping, Housework, Cooking, Mobility   Primary Language: English   Can patient return to prior living arrangement: Yes   Ability to make needs known:: Fair   Family able to assist with home care needs:: Yes   Pets: None           Types of Needs Identified: ADLs/IADLs, Safety/Abuse, Activity/Exercise, Treatment  Education, Disease Management Education   Anticipated Discharge Needs: Home Health Services   Confirm Follow Up Transport: Self       Plan discussed with Pt/Family/Caregiver: Yes   Freedom of Choice Offered: Yes (HH list given in the event pt needs hh at d/c.)      DISCHARGE LOCATION   Discharge Placement: Home with family assistance

## 2016-08-16 NOTE — Progress Notes (Signed)
Dr. Trinidad Curetatarkina notified of [name] [name] persistent complaints of abd pain despite recent dose of percocet at approx. 8:30a however, also requesting to eat...new orders for diet and miralax but MD stated no change in pain management and will not order additional medication.

## 2016-08-16 NOTE — Progress Notes (Signed)
Dr Trinidad Curetatarkina notified of elevated BP and request for headache med and request for regular diet. abdominal pain subsiding,if not, gone.

## 2016-08-17 LAB — METABOLIC PANEL, BASIC
BUN: 9 mg/dl (ref 7–25)
CO2: 27 mEq/L (ref 21–32)
Calcium: 8.7 mg/dl (ref 8.5–10.1)
Chloride: 110 mEq/L — ABNORMAL HIGH (ref 98–107)
Creatinine: 0.8 mg/dl (ref 0.6–1.3)
GFR est AA: >60
GFR est non-AA: >60
Glucose: 122 mg/dl — ABNORMAL HIGH (ref 74–106)
Potassium: 3.5 mEq/L (ref 3.5–5.1)
Sodium: 143 mEq/L (ref 136–145)

## 2016-08-17 LAB — GLUCOSE, POC
Glucose (POC): 168 mg/dL — ABNORMAL HIGH (ref 65–105)
Glucose (POC): 131 mg/dL — ABNORMAL HIGH (ref 65–105)
Glucose (POC): 222 mg/dL — ABNORMAL HIGH (ref 65–105)

## 2016-08-17 MED ORDER — POLYETHYLENE GLYCOL 3350 17 GRAM (100 %) ORAL POWDER PACKET
17 gram | Freq: Every day | ORAL | 1 refills | Status: DC
Start: 2016-08-17 — End: 2017-01-09

## 2016-08-17 MED ORDER — SODIUM CHLORIDE 0.9 % NEB SOLUTION
0.9 % | RESPIRATORY_TRACT | Status: AC
Start: 2016-08-17 — End: 2016-08-16
  Administered 2016-08-17: 02:00:00

## 2016-08-17 MED ORDER — PROMETHAZINE 12.5 MG TAB
12.5 mg | ORAL_TABLET | Freq: Four times a day (QID) | ORAL | 0 refills | Status: DC | PRN
Start: 2016-08-17 — End: 2017-01-09

## 2016-08-17 MED ORDER — ALBUTEROL SULFATE 2.5 MG/0.5 ML NEB SOLUTION
2.5 mg/0.5 mL | RESPIRATORY_TRACT | Status: DC | PRN
Start: 2016-08-17 — End: 2016-08-17
  Administered 2016-08-17: 02:00:00 via RESPIRATORY_TRACT

## 2016-08-17 MED ORDER — OXYCODONE-ACETAMINOPHEN 10 MG-325 MG TAB
10-325 mg | ORAL_TABLET | Freq: Four times a day (QID) | ORAL | 0 refills | Status: DC | PRN
Start: 2016-08-17 — End: 2016-12-07

## 2016-08-17 MED ORDER — ALBUTEROL SULFATE 2.5 MG/0.5 ML NEB SOLUTION
2.5 mg/0.5 mL | Freq: Four times a day (QID) | RESPIRATORY_TRACT | Status: DC
Start: 2016-08-17 — End: 2016-08-16

## 2016-08-17 MED FILL — CLONIDINE 0.1 MG TAB: 0.1 mg | ORAL | Qty: 3

## 2016-08-17 MED FILL — GABAPENTIN 300 MG CAP: 300 mg | ORAL | Qty: 1

## 2016-08-17 MED FILL — SODIUM CHLORIDE 0.9 % NEB SOLUTION: 0.9 % | RESPIRATORY_TRACT | Qty: 3

## 2016-08-17 MED FILL — HEPARIN (PORCINE) 5,000 UNIT/ML IJ SOLN: 5000 unit/mL | INTRAMUSCULAR | Qty: 1

## 2016-08-17 MED FILL — TRAZODONE 50 MG TAB: 50 mg | ORAL | Qty: 2

## 2016-08-17 MED FILL — QUETIAPINE SR 50 MG 24 HR TAB: 50 mg | ORAL | Qty: 3

## 2016-08-17 MED FILL — BD POSIFLUSH NORMAL SALINE 0.9 % INJECTION SYRINGE: INTRAMUSCULAR | Qty: 10

## 2016-08-17 MED FILL — PANTOPRAZOLE 40 MG IV SOLR: 40 mg | INTRAVENOUS | Qty: 40

## 2016-08-17 MED FILL — POLYETHYLENE GLYCOL 3350 17 GRAM (100 %) ORAL POWDER PACKET: 17 gram | ORAL | Qty: 1

## 2016-08-17 MED FILL — LOSARTAN 50 MG TAB: 50 mg | ORAL | Qty: 2

## 2016-08-17 MED FILL — SODIUM CHLORIDE 0.9 % INJECTION: INTRAMUSCULAR | Qty: 10

## 2016-08-17 MED FILL — ALBUTEROL SULFATE 2.5 MG/0.5 ML NEB SOLUTION: 2.5 mg/0.5 mL | RESPIRATORY_TRACT | Qty: 0.5

## 2016-08-17 MED FILL — AMLODIPINE 5 MG TAB: 5 mg | ORAL | Qty: 2

## 2016-08-17 NOTE — Progress Notes (Signed)
Patient discharged home with no needs.  Patient declined any home health needs at this time.  Spouse, Heywood IlesBobby Goodwin, to pick up patient for discharge.      Discharge Plan:   Home with no needs    Discharge Date:     08/17/2016     Assisted Living Facility:    N/A    FOC given :     The facility confirmed that they are ready to receive the patient:     Yes/No; the CM spoke with      N/A    Does the patient have  an insurance company assigned cm   Yes/NO    Spoke with case manager:   N/A    Collaborative dc plan :   N/A    Home Health Needed:     Declined    Home Health Agency:     N/A    Confirmed start of care with the home health agency and spoke with:       N/A    DME needed and ordered for Discharge:    None    DME Company:     N/A    CM confirmed delivery of DME: with       N/A    TCC Referral:     No    Medication Assistance given     Y/N       meds given (please list)     None    Change(MDC/SSDI) Referral/outcome     N/A     Transportation: Family/ Medical    family    Transport Time:     N/A    Family, MD, patient, nurse and facility aware of pickup time     N/A

## 2016-08-17 NOTE — Progress Notes (Signed)
SW consult for : Substance abuse resources    Sw spoke with pt privately in her room.  Pt disclosed that she does use marijuana but does not want any treatment options at this time.  Pt also disclosed that she is in pain management but could not recall the doctor or the location of the facility in which she is seeking treatment.

## 2016-08-17 NOTE — Discharge Summary (Signed)
Discharge Summary   Admit Date: 08/15/2016  Discharge Date:  08/17/2016    Patient ID:  [name] [name]  66 y.o.  January 22, 1951    Chief Complaint   Patient presents with   ??? Abdominal Pain   ??? Vomiting       Patient Active Problem List    Diagnosis Date Noted   ??? 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 Cannabinoid hyperemesis syndrome ([facility]) 08/17/2016   ??? Gastritis 08/16/2016   ??? Nausea & vomiting 08/16/2016   ??? Asthma with acute exacerbation 08/04/2016   ??? Lactic acidosis 07/24/2016   ??? Leukocytosis 07/24/2016   ??? Sepsis ([facility]) 07/24/2016   ??? Asthma exacerbation 07/24/2016   ??? 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 Sacroiliitis ([facility]) 11/25/2015   ??? Essential hypertension 10/06/2015   ??? 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 Lumbar and sacral osteoarthritis 09/15/2015   ??? Chronic pain syndrome 09/15/2015   ??? Type 2 diabetes mellitus with hyperglycemia, without long-term current use of insulin ([facility]) 08/20/2015   ??? Acute colitis 07/02/2015   ??? Acute hyperglycemia 07/02/2015   ??? Accelerated hypertension 04/08/2015   ??? Severe headache 04/07/2015   ??? 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 PTSD (post-traumatic stress disorder) 01/29/2015   ??? Severe hypertension 07/21/2014       Discharge Diagnosis:  Canabinoid hyperemesis syndrome. Narcotic Bowel syndrome      Current Discharge Medication List      START taking these medications    Details   polyethylene glycol (MIRALAX) 17 gram packet Take 1 Packet by mouth daily.  Qty: 1 Each, Refills: 1      promethazine (PHENERGAN) 12.5 mg tablet Take 1 Tab by mouth every six (6) hours as needed for Nausea.  Qty: 15 Tab, Refills: 0         CONTINUE these medications which have CHANGED    Details   oxyCODONE-acetaminophen (PERCOCET) 10-325 mg per tablet Take 1 Tab by mouth every six (6) hours as needed for Pain. Max Daily Amount: 4 Tabs. Takes for chronic hip pain  Qty: 12 Tab, Refills: 0     Associated Diagnoses: Primary osteoarthritis of both hips         CONTINUE these medications which have NOT CHANGED    Details   traZODone (DESYREL) 100 mg tablet TAKE 1 TABLET BY MOUTH NIGHTLY  Qty: 90 Tab, Refills: 0    Associated Diagnoses: Insomnia, unspecified type      spironolactone (ALDACTONE) 100 mg tablet TAKE 1 TABLET BY MOUTH DAILY  Qty: 90 Tab, Refills: 0    Associated Diagnoses: Severe hypertension      losartan (COZAAR) 100 mg tablet Take 1 Tab by mouth daily. Indications: hypertension  Qty: 90 Tab, Refills: 2    Associated Diagnoses: Severe hypertension      QUEtiapine SR (SEROQUEL XR) 150 mg sr tablet TAKE 1 TABLET BY MOUTH NIGHTLY  Qty: 90 Tab, Refills: 1    Associated Diagnoses: Medication refill      gabapentin (NEURONTIN) 600 mg tablet TAKE 1 TABLET BY MOUTH THREE TIMES DAILY  Qty: 270 Tab, Refills: 2    Comments: **Patient requests 90 days supply**  Associated Diagnoses: Medication refill      albuterol (PROVENTIL HFA, VENTOLIN HFA, PROAIR HFA) 90 mcg/actuation inhaler Take 2 Puffs by inhalation every four (4) hours as needed for Wheezing.  Qty: 1 Inhaler, Refills: 2    Associated Diagnoses: Asthma  with acute exacerbation, unspecified asthma severity, unspecified whether persistent      amLODIPine (NORVASC) 10 mg tablet TAKE ONE TABLET BY MOUTH ONCE DAILY  Qty: 90 Tab, Refills: 1    Associated Diagnoses: Essential hypertension with goal blood pressure less than 140/90      rosuvastatin (CRESTOR) 5 mg tablet Take 1 Tab by mouth nightly.  Qty: 90 Tab, Refills: 1    Associated Diagnoses: Type 2 diabetes mellitus with nephropathy (HCC)      metFORMIN ER (GLUCOPHAGE XR) 750 mg tablet TAKE 2 TABLETS EVERY DAY  Qty: 180 Tab, Refills: 1    Associated Diagnoses: Type 2 diabetes mellitus with hyperglycemia, without long-term current use of insulin (HCC)      cloNIDine HCl (CATAPRES) 0.3 mg tablet TAKE ONE TABLET BY MOUTH TWICE DAILY  Qty: 180 Tab, Refills: 1       magnesium oxide (MAG-OX) 400 mg tablet TAKE 1 TABLET BY MOUTH DAILY  Qty: 90 Tab, Refills: 0    Associated Diagnoses: Hypomagnesemia      Blood-Glucose Meter monitoring kit Check sugars 2 times per day, fasting and 2 hours after dinner  Qty: 1 Kit, Refills: 0    Associated Diagnoses: Diabetes mellitus, new onset (HCC)      Lancets misc Check sugars twice daily  Qty: 200 Each, Refills: 2    Associated Diagnoses: Diabetes mellitus, new onset (HCC)      glucose blood VI test strips (ASCENSIA AUTODISC VI, ONE TOUCH ULTRA TEST VI) strip Check sugars twice daily  Qty: 100 Strip, Refills: 5    Associated Diagnoses: Diabetes mellitus, new onset ([facility])         STOP taking these medications       predniSONE (DELTASONE) 10 mg tablet Comments:   Reason for Stopping:               Operative Procedures:  none    Consultants:  none    Hospital Course:  [name] [name] is a 66 year old woman with past medical history of DM II HTN HL Chronic back pain and chronic hip pain who presented to the ED with CO nausea and vomiting for the previous 2 days. She endorsed abd pain as well. She denied diarrhea or constipation. Her las BM was the day prior. She denies hematemesis, hematochezia or melena. A CT of the abd/pelvis done in the ED was unremarkable other than a large amount of stool present. Her lipase is normal.     She was admitted and her home BP medications were reconciled and continued. glucophage was held and insulin SS protocols were maintained. Her medications for bipolar syndrome were also continued. PRN phenergan was provided for nausea. Percocet was given with caution and sparingly. A UDS was done and is POS for narcotics and cannabinoids. Eliminating cannabinoids and using narcotic pain medications sparingly is recommended. She has also been provided with referrals for substance abuse counseling. She will FU with her PCP to continue outpatient support/FU of her acute  symptoms and regular health maintenance as before. Her routine home medications are continued without changes.         Physical Exam on Discharge:  Visit Vitals   ??? BP 117/81 (BP 1 Location: Right arm, BP Patient Position: Supine)   ??? Pulse 79   ??? Temp 98.4 ??F (36.9 ??C)   ??? Resp 18   ??? Ht 5' 8" (1.727 m)   ??? Wt 89.7 kg (197 lb 12 oz)   ??? SpO2 95%   ???  BMI 30.07 kg/m2         Constitutional: well developed, nourished, no distress and alert and oriented x 3   HENT: atraumatic, nose normal, normocephalic, left exterior ear normal, right external ear normal and oropharynx clear and moist   Eyes: conjunctiva normal, EOM normal and PERRL   Neck: ROM normal, supple, trachea normal and cervical adenopathy   Cardiovascular: heart sounds normal, intact distal pulses, normal rate and regular rhythm   Pulmonary/Chest Wall: breath sounds normal and effort normal   Abdominal: appearance normal, bowel sounds normal and soft   Genitourinary/Anorectal: deferred   Musculoskeletal: normal ROM   Neurological: awake, alert and oriented x 3 and gait normal   Skin: dry, intact and warm   Psych:  appropriate               Most Recent BMP and CBC:    Basic Metabolic Profile   Lab Results   Component Value Date/Time    Sodium 143 08/17/2016 05:48 AM    Sodium 133 (L) 08/15/2016 10:03 PM    Sodium 139 07/25/2016 07:15 AM    Potassium 3.5 08/17/2016 05:48 AM    Potassium 3.8 08/15/2016 10:03 PM    Potassium 4.0 07/25/2016 07:15 AM    Chloride 110 (H) 08/17/2016 05:48 AM    Chloride 98 08/15/2016 10:03 PM    Chloride 108 (H) 07/25/2016 07:15 AM    CO2 27 08/17/2016 05:48 AM    CO2 23 08/15/2016 10:03 PM    CO2 24 07/25/2016 07:15 AM    Anion gap 9 06/10/2016 01:00 PM    Anion gap 11 08/21/2015 01:40 PM    Anion gap 11 01/31/2015 04:30 AM    Glucose 122 (H) 08/17/2016 05:48 AM    Glucose 171 (H) 08/15/2016 10:03 PM    Glucose 140 (H) 07/25/2016 07:15 AM    BUN 9 08/17/2016 05:48 AM    BUN 13 08/15/2016 10:03 PM    BUN 18 07/25/2016 07:15 AM     Creatinine 0.8 08/17/2016 05:48 AM    Creatinine 0.9 08/15/2016 10:03 PM    Creatinine 0.8 07/27/2016 07:19 AM    BUN/Creatinine ratio 24 (H) 06/10/2016 01:00 PM    BUN/Creatinine ratio 19 08/21/2015 01:40 PM    BUN/Creatinine ratio 21 (H) 01/31/2015 04:30 AM    GFR est AA >60.0 08/17/2016 05:48 AM    GFR est AA >60.0 08/15/2016 10:03 PM    GFR est AA >60.0 07/27/2016 07:19 AM    GFR est non-AA >60 08/17/2016 05:48 AM    GFR est non-AA >60 08/15/2016 10:03 PM    GFR est non-AA >60 07/27/2016 07:19 AM    Calcium 8.7 08/17/2016 05:48 AM    Calcium 10.3 (H) 08/15/2016 10:03 PM    Calcium 9.1 07/25/2016 07:15 AM           CBC w/Diff    Lab Results   Component Value Date/Time    WBC 14.7 (H) 08/15/2016 10:03 PM    WBC 12.2 (H) 07/25/2016 07:15 AM    WBC 25.9 (H) 07/23/2016 10:55 PM    Hemoglobin (POC) 11.8 12/16/2014 02:13 PM    HGB 14.7 08/15/2016 10:03 PM    HGB 13.3 07/25/2016 07:15 AM    HGB 12.8 07/24/2016 01:22 PM    HCT 42.4 08/15/2016 10:03 PM    HCT 38.7 07/25/2016 07:15 AM    HCT 38 07/24/2016 01:22 PM    PLATELET 254 08/15/2016 10:03 PM    PLATELET 253 07/25/2016  07:15 AM    PLATELET 369 07/23/2016 10:55 PM    MCV 86.0 08/15/2016 10:03 PM    MCV 86.4 07/25/2016 07:15 AM    MCV 84.6 07/23/2016 10:55 PM       Recent Labs      08/15/16   2203   WBC  14.7*   RBC  4.93   HGB  14.7   HCT  42.4   PLT  254   GRANS  88.3*   LYMPH  8.4*   MONOS  2.7   EOS  0.0   BASOS  0.2              Condition at discharge: Afebrile  Ambulating  Eating, Drinking, Voiding  Stable    Disposition:  Home    Home or Self Care    PCP:  [place] Bells, MD

## 2016-08-17 NOTE — Progress Notes (Signed)
Dr. Harrel LemonHoliday notified of last evenings RRT with Narcan and patients response almost immediatedly and denying additional sources of narcotics. Also, discussed request for advancing diet. MD requesting to continue to hold narcotics today .

## 2016-08-17 NOTE — Other (Addendum)
----------  DocumentID: [id number]------------------------------------------------              [facility]                       Patient Education Report         Name: [name] [name], [name] [name]                  Date: 08/16/2016    MRN: [id number]                    Time: 3:32:55 AM         Patient ordered video: 'Patient Safety: Stay Safe While you are in the Hospital'    from [id number]_2213_1 via phone number: [phone] at 3:32:55 AM    Description: This program outlines some of the precautions patients can take to ensure a speedy recovery without extra complications. The video emphasizes the importance of communicating with the healthcare team.    ----------DocumentID: [id number]------------------------------------------------                       [facility]          Patient Education Report - Discharge Summary        Date: 08/17/2016   Time: 3:40:30 PM   Name: [name] [name], [name] [name]   MRN: [id number]      Account Number: [id number]      Education History:        Patient ordered video: 'Patient Safety: Stay Safe While you are in the Hospital' from [id number]_2213_1 on 08/16/2016 03:32:55 AM

## 2016-08-17 NOTE — Progress Notes (Signed)
Discharge instructions reviewed with both Mr and Mrs Orvan JulyOwen's verbalizing understanding. Alert and oriented x 4. Excited about going home and having a steak.  Encouraged to pursue persistent pain  issues related to left hip replacement and alternatives other than opioids for pain relief with pain management clinic. Discussed last evening episode of excessive somnulence/ unresponsiveness requiring narcan delivery with immediate recovery to consciousness. Denied taking supplemental opioids from outside hospital sources. Wheelchair escort with client expressing appreciation for care and denying need for further hospitalization.

## 2016-08-17 NOTE — Progress Notes (Signed)
At 1630, Notified Mr Orvan JulyOwen's that a prescription for percocet was available for pickup from Dr Marcelle OverlieHolland. Mr Barry DienesOwens said that he would come either today or tomorrow,however, called back with Aggie Cosierheresa Training and development officer(secretary) accepting message that they already had a current prescription at home and would not be needing it . Prescription placed in shredder box for disposition

## 2016-08-17 NOTE — Progress Notes (Signed)
Problem: Falls - Risk of  Goal: *Absence of Falls  Document Schmid Fall Risk and appropriate interventions in the flowsheet.   Outcome: Progressing Towards Goal  Fall Risk Interventions:            Medication Interventions: Bed/chair exit alarm, Evaluate medications/consider consulting pharmacy, Patient to call before getting OOB, Teach patient to arise slowly

## 2016-08-17 NOTE — Discharge Summary (Signed)
Discharge Summary   Admit Date: 08/15/2016  Discharge Date:  08/17/2016    Patient ID:  [name] [name]  66 y.o.  1951/02/22    Chief Complaint   Patient presents with   ??? Abdominal Pain   ??? Vomiting       Patient Active Problem List    Diagnosis Date Noted   ??? 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 Cannabinoid hyperemesis syndrome ([place]) 08/17/2016   ??? Gastritis 08/16/2016   ??? Nausea & vomiting 08/16/2016   ??? Asthma with acute exacerbation 08/04/2016   ??? Lactic acidosis 07/24/2016   ??? Leukocytosis 07/24/2016   ??? Sepsis ([place]) 07/24/2016   ??? Asthma exacerbation 07/24/2016   ??? Type 2 diabetes mellitus with nephropathy ([place]) 06/12/2016   ??? Sacroiliitis ([place]) 11/25/2015   ??? Essential hypertension 10/06/2015   ??? 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 Lumbar and sacral osteoarthritis 09/15/2015   ??? Chronic pain syndrome 09/15/2015   ??? Type 2 diabetes mellitus with hyperglycemia, without long-term current use of insulin ([place]) 08/20/2015   ??? Acute colitis 07/02/2015   ??? Acute hyperglycemia 07/02/2015   ??? Accelerated hypertension 04/08/2015   ??? Severe headache 04/07/2015   ??? 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 PTSD (post-traumatic stress disorder) 01/29/2015   ??? Severe hypertension 07/21/2014       Discharge Diagnosis:  Canabinoid hyperemesis syndrome. Narcotic Bowel syndrome      Current Discharge Medication List      START taking these medications    Details   polyethylene glycol (MIRALAX) 17 gram packet Take 1 Packet by mouth daily.  Qty: 1 Each, Refills: 1      promethazine (PHENERGAN) 12.5 mg tablet Take 1 Tab by mouth every six (6) hours as needed for Nausea.  Qty: 15 Tab, Refills: 0         CONTINUE these medications which have CHANGED    Details   oxyCODONE-acetaminophen (PERCOCET) 10-325 mg per tablet Take 1 Tab by mouth every six (6) hours as needed for Pain. Max Daily Amount: 4 Tabs. Takes for chronic hip pain  Qty: 12 Tab, Refills: 0    Associated Diagnoses: Primary  osteoarthritis of both hips         CONTINUE these medications which have NOT CHANGED    Details   traZODone (DESYREL) 100 mg tablet TAKE 1 TABLET BY MOUTH NIGHTLY  Qty: 90 Tab, Refills: 0    Associated Diagnoses: Insomnia, unspecified type      spironolactone (ALDACTONE) 100 mg tablet TAKE 1 TABLET BY MOUTH DAILY  Qty: 90 Tab, Refills: 0    Associated Diagnoses: Severe hypertension      losartan (COZAAR) 100 mg tablet Take 1 Tab by mouth daily. Indications: hypertension  Qty: 90 Tab, Refills: 2    Associated Diagnoses: Severe hypertension      QUEtiapine SR (SEROQUEL XR) 150 mg sr tablet TAKE 1 TABLET BY MOUTH NIGHTLY  Qty: 90 Tab, Refills: 1    Associated Diagnoses: Medication refill      gabapentin (NEURONTIN) 600 mg tablet TAKE 1 TABLET BY MOUTH THREE TIMES DAILY  Qty: 270 Tab, Refills: 2    Comments: **Patient requests 90 days supply**  Associated Diagnoses: Medication refill      albuterol (PROVENTIL HFA, VENTOLIN HFA, PROAIR HFA) 90 mcg/actuation inhaler Take 2 Puffs by inhalation every four (4) hours as needed for Wheezing.  Qty: 1 Inhaler, Refills: 2    Associated Diagnoses: Asthma  with acute exacerbation, unspecified asthma severity, unspecified whether persistent      amLODIPine (NORVASC) 10 mg tablet TAKE ONE TABLET BY MOUTH ONCE DAILY  Qty: 90 Tab, Refills: 1    Associated Diagnoses: Essential hypertension with goal blood pressure less than 140/90      rosuvastatin (CRESTOR) 5 mg tablet Take 1 Tab by mouth nightly.  Qty: 90 Tab, Refills: 1    Associated Diagnoses: Type 2 diabetes mellitus with nephropathy (HCC)      metFORMIN ER (GLUCOPHAGE XR) 750 mg tablet TAKE 2 TABLETS EVERY DAY  Qty: 180 Tab, Refills: 1    Associated Diagnoses: Type 2 diabetes mellitus with hyperglycemia, without long-term current use of insulin (HCC)      cloNIDine HCl (CATAPRES) 0.3 mg tablet TAKE ONE TABLET BY MOUTH TWICE DAILY  Qty: 180 Tab, Refills: 1      magnesium oxide (MAG-OX) 400 mg tablet TAKE 1 TABLET BY MOUTH  DAILY  Qty: 90 Tab, Refills: 0    Associated Diagnoses: Hypomagnesemia      Blood-Glucose Meter monitoring kit Check sugars 2 times per day, fasting and 2 hours after dinner  Qty: 1 Kit, Refills: 0    Associated Diagnoses: Diabetes mellitus, new onset (HCC)      Lancets misc Check sugars twice daily  Qty: 200 Each, Refills: 2    Associated Diagnoses: Diabetes mellitus, new onset (HCC)      glucose blood VI test strips (ASCENSIA AUTODISC VI, ONE TOUCH ULTRA TEST VI) strip Check sugars twice daily  Qty: 100 Strip, Refills: 5    Associated Diagnoses: Diabetes mellitus, new onset ([place])         STOP taking these medications       predniSONE (DELTASONE) 10 mg tablet Comments:   Reason for Stopping:               Operative Procedures:  none    Consultants:  none    Hospital Course:  [name] [name] is a 66 year old woman with past medical history of DM II HTN HL Chronic back pain and chronic hip pain who presented to the ED with CO nausea and vomiting for the previous 2 days. She endorsed abd pain as well. She denied diarrhea or constipation. Her las BM was the day prior. She denies hematemesis, hematochezia or melena. A CT of the abd/pelvis done in the ED was unremarkable other than a large amount of stool present. Her lipase is normal.     She was admitted and her home BP medications were reconciled and continued. glucophage was held and insulin SS protocols were maintained. Her medications for bipolar syndrome were also continued. PRN phenergan was provided for nausea. Percocet was given with caution and sparingly. A UDS was done and is POS for narcotics and cannabinoids. Eliminating cannabinoids and using narcotic pain medications sparingly is recommended. She has also been provided with referrals for substance abuse counseling. She will FU with her PCP to continue outpatient support/FU of her acute symptoms and regular health maintenance as before. Her routine home medications are continued without changes.         Physical  Exam on Discharge:  Visit Vitals   ??? BP 117/81 (BP 1 Location: Right arm, BP Patient Position: Supine)   ??? Pulse 79   ??? Temp 98.4 ??F (36.9 ??C)   ??? Resp 18   ??? Ht '5\' 8"'$  (1.727 m)   ??? Wt 89.7 kg (197 lb 12 oz)   ??? SpO2 95%   ???  BMI 30.07 kg/m2         Constitutional: well developed, nourished, no distress and alert and oriented x 3   HENT: atraumatic, nose normal, normocephalic, left exterior ear normal, right external ear normal and oropharynx clear and moist   Eyes: conjunctiva normal, EOM normal and PERRL   Neck: ROM normal, supple, trachea normal and cervical adenopathy   Cardiovascular: heart sounds normal, intact distal pulses, normal rate and regular rhythm   Pulmonary/Chest Wall: breath sounds normal and effort normal   Abdominal: appearance normal, bowel sounds normal and soft   Genitourinary/Anorectal: deferred   Musculoskeletal: normal ROM   Neurological: awake, alert and oriented x 3 and gait normal   Skin: dry, intact and warm   Psych:  appropriate               Most Recent BMP and CBC:    Basic Metabolic Profile   Lab Results   Component Value Date/Time    Sodium 143 08/17/2016 05:48 AM    Sodium 133 (L) 08/15/2016 10:03 PM    Sodium 139 07/25/2016 07:15 AM    Potassium 3.5 08/17/2016 05:48 AM    Potassium 3.8 08/15/2016 10:03 PM    Potassium 4.0 07/25/2016 07:15 AM    Chloride 110 (H) 08/17/2016 05:48 AM    Chloride 98 08/15/2016 10:03 PM    Chloride 108 (H) 07/25/2016 07:15 AM    CO2 27 08/17/2016 05:48 AM    CO2 23 08/15/2016 10:03 PM    CO2 24 07/25/2016 07:15 AM    Anion gap 9 06/10/2016 01:00 PM    Anion gap 11 08/21/2015 01:40 PM    Anion gap 11 01/31/2015 04:30 AM    Glucose 122 (H) 08/17/2016 05:48 AM    Glucose 171 (H) 08/15/2016 10:03 PM    Glucose 140 (H) 07/25/2016 07:15 AM    BUN 9 08/17/2016 05:48 AM    BUN 13 08/15/2016 10:03 PM    BUN 18 07/25/2016 07:15 AM    Creatinine 0.8 08/17/2016 05:48 AM    Creatinine 0.9 08/15/2016 10:03 PM    Creatinine 0.8 07/27/2016 07:19 AM    BUN/Creatinine  ratio 24 (H) 06/10/2016 01:00 PM    BUN/Creatinine ratio 19 08/21/2015 01:40 PM    BUN/Creatinine ratio 21 (H) 01/31/2015 04:30 AM    GFR est AA >60.0 08/17/2016 05:48 AM    GFR est AA >60.0 08/15/2016 10:03 PM    GFR est AA >60.0 07/27/2016 07:19 AM    GFR est non-AA >60 08/17/2016 05:48 AM    GFR est non-AA >60 08/15/2016 10:03 PM    GFR est non-AA >60 07/27/2016 07:19 AM    Calcium 8.7 08/17/2016 05:48 AM    Calcium 10.3 (H) 08/15/2016 10:03 PM    Calcium 9.1 07/25/2016 07:15 AM           CBC w/Diff    Lab Results   Component Value Date/Time    WBC 14.7 (H) 08/15/2016 10:03 PM    WBC 12.2 (H) 07/25/2016 07:15 AM    WBC 25.9 (H) 07/23/2016 10:55 PM    Hemoglobin (POC) 11.8 12/16/2014 02:13 PM    HGB 14.7 08/15/2016 10:03 PM    HGB 13.3 07/25/2016 07:15 AM    HGB 12.8 07/24/2016 01:22 PM    HCT 42.4 08/15/2016 10:03 PM    HCT 38.7 07/25/2016 07:15 AM    HCT 38 07/24/2016 01:22 PM    PLATELET 254 08/15/2016 10:03 PM    PLATELET 253 07/25/2016  07:15 AM    PLATELET 369 07/23/2016 10:55 PM    MCV 86.0 08/15/2016 10:03 PM    MCV 86.4 07/25/2016 07:15 AM    MCV 84.6 07/23/2016 10:55 PM       Recent Labs      08/15/16   2203   WBC  14.7*   RBC  4.93   HGB  14.7   HCT  42.4   PLT  254   GRANS  88.3*   LYMPH  8.4*   MONOS  2.7   EOS  0.0   BASOS  0.2              Condition at discharge: Afebrile  Ambulating  Eating, Drinking, Voiding  Stable    Disposition:  Home    Home or Self Care    PCP:  Lake Bells, MD

## 2016-08-19 NOTE — Progress Notes (Signed)
Nurse Navigator attempted to reach patient via telephone call. A female answered and stated patient was not there.  Nurse Nurse Navigator inquired if I could call her call phone number. The female stated she would tell patient that I called.       Date of Admission:  08-15-16  Hospital Discharge: 08-17-16  [facility]  Disposition:  Home         Anne ShutterMonica R Breeze is a 66 y.o. female     This patient was received as a referral from inpatient admission   Summary of patients top  medical problems  per discharge summary of 08-17-16 by Dr. Nelida MeuseJ. Holladay  ( In Italics):      Problem 1: Canabinoid Hyperemesis Syndrome     Problem 2: Naracotic Bowel Syndrome       Nurse Navigator will continue with outreach efforts to follow up with patient.

## 2016-08-22 ENCOUNTER — Encounter

## 2016-08-22 NOTE — Progress Notes (Signed)
Nurse Navigator spoke with patient via telephone call.     Subjective:   Patient states, " I am good" " Not at my best yet"     Patient reports that she is having mild stomach pain.     Nurse Navigator discussed follow up appointment scheduled for 08-24-16 @ 0830. Patient stated that is too early for her.  She is agreeable with Nurse Navigator trying to find a later time for her appointment. Nurse Navigator will follow up and call back with another time.

## 2016-08-22 NOTE — Progress Notes (Signed)
Follow up Telephone Call     [facility] and discharged 3//7/18.   Disposition: Patient dc to home.         [name] [name] is a 66 y.o. female       This patient was received as a referral from inpatient admission   Summary of patients top three medical problem  (per dc summary 08-17-16 by J. Holladay).      Problem 1: Canabinoid Hyperemesis Syndrome     Problem 2: Narcotic Bowel Syndrome     Problem 3: Gastritis    Patient's challenges to self management identified:    Will continue to assess    Patients motivational level on a scale of 0-10:   Will Continue to assess    Medication Management:    Patient unable to complete medication review at this time.  Patient states that she is lying in bed.   Patient states that Miralax and Phenergan have been obtained.   Patient states she has stopped Prednisone.   Patient reports she is not taking Percocet at this time.     Advance Care Planning:   Patient was offered the opportunity to discuss advance care planning:  no     Does patient have an Advance Directive:  yes   If no, did you provide information on Advance Care Planning? N/A     [facility], Referrals, and Durable Medical Equipment:     Patient denies use of home health     Follow up appointments:    Patient scheduled TOC follow up for 08-24-16.   Patient states that she prefers to keep her PCP (TOC) appointment as scheduled for 08-29-16.   Goals     ??? Attends follow-up appointments as directed.            Plan:  Patient to attend TOC and follow up appointments as scheduled or reschedule appointments as needed.     07-29-16:   -PCP follow up appointment scheduled for 08-01-16 @ 1:15.    - Patient to follow up with Dr. Faythe GheeNaveen Akkina within 2 weeks.  [phone]. Briefly discussed follow up with patient. Patient stated, " I just got out of the hospital".              ??? Understands red flags post discharge.            08-22-16   Red flags reviewed with patient as follows:   - Shortness of breath  - Dizziness  - Fainting  - Fever   - abdominal pain, nausea, vomiting  Patient to follow up with physician or ED as needed for symptoms           Patient advised PCP office can be contacted 24 hours per day 7 days a week for any concerns or questions.       Patient verbalized understanding of all information discussed. Patient has this Nurse Navigators contact information for any further questions, concerns, or needs.    Chart CC to Dr. Jac Canavanharakan for review.

## 2016-08-23 MED ORDER — QUETIAPINE SR 150 MG 24 HR TAB
150 mg | ORAL_TABLET | ORAL | 1 refills | Status: DC
Start: 2016-08-23 — End: 2017-01-25

## 2016-08-24 ENCOUNTER — Encounter: Attending: Family Medicine | Primary: Physician Assistant

## 2016-08-27 ENCOUNTER — Emergency Department: Admit: 2016-08-28 | Payer: MEDICARE | Primary: Physician Assistant

## 2016-08-27 ENCOUNTER — Inpatient Hospital Stay
Admit: 2016-08-27 | Discharge: 2016-08-28 | Disposition: A | Discharge status: Home / Self Care | Payer: MEDICARE | Attending: Emergency Medicine

## 2016-08-27 DIAGNOSIS — R112 Nausea with vomiting, unspecified: Secondary | ICD-10-CM

## 2016-08-27 NOTE — ED Notes (Signed)
Patient steady on her feet to the bathroom.

## 2016-08-27 NOTE — ED Notes (Signed)
Assumed care at this time. Patient was seen here about 2 days ago and was diagnosed with constipation. She has been on Miralax and drinking carrot juice and apple juice and now she has abdominal cramping and diarrhea. Her blood pressure is elevated 155/115 . She just told the previous nurse that she has not taken her BP medication today.

## 2016-08-27 NOTE — ED Provider Notes (Signed)
[facility]  Emergency Department Treatment Report        Patient: [name] [name] Age: 66 y.o. Sex: female    Date of Birth: 1950-10-29 Admit Date: 08/27/2016 PCP: Lake Bells, MD   MRN: [id number]  CSN: [id number]  Attending: Martin Majestic, MD   Room: ER39/ER39 Time Dictated: 9:19 PM APP: Fredda Hammed, PA-C     Chief Complaint   Chief Complaint   Patient presents with   ??? Abdominal Pain            History of Present Illness   66 y.o. female with history of asthma, DM, HTN, GERD, and chronic low back pain in pain management who presents for evaluation of abdominal pain, vomiting and diarrhea that started this morning.  Patient states she was recently admitted with similar symptoms however states the abdominal pain is worse today.  She describes as diffuse, crampy abdominal pain is concentrated in the left lower quadrant.  She notes no aggravating or alleviating factors.  She states she has not eaten or drank anything today.  She endorses 3 episodes of vomiting and numerous episodes of diarrhea since this morning.  She denies any blood in her stool or vomit.  She notes she was instructed to take MiraLAX when she was discharged last week and began taking this 2 days ago. She endorses associated chills.  She denies fevers, chest pain, shortness of breath, and urinary symptoms.  She also notes a productive cough that started today. She endorses marijuana use.    Review of Systems   Review of Systems   Constitutional: Positive for chills. Negative for fever.   HENT: Negative for congestion and sore throat.    Eyes: Negative.    Respiratory: Positive for cough and sputum production. Negative for hemoptysis, shortness of breath and wheezing.    Cardiovascular: Negative for chest pain and leg swelling.   Gastrointestinal: Positive for abdominal pain, diarrhea, nausea and vomiting. Negative for blood in stool and constipation.   Genitourinary: Negative for dysuria, frequency and urgency.    Musculoskeletal: Positive for back pain (chronic). Negative for myalgias.   Skin: Negative for rash.   Neurological: Negative for tingling and headaches.       Past Medical/Surgical History     Past Medical History:   Diagnosis Date   ??? Arthritis    ??? Chronic pain    ??? Diabetes ([place])    ??? GERD (gastroesophageal reflux disease)    ??? Hypertension    ??? Liver disease     HEP C   ??? PTSD (post-traumatic stress disorder)     lived through the [place] in 1993     Past Surgical History:   Procedure Laterality Date   ??? HX ENDOSCOPY     ??? HX GYN  2010    hysterectomy   ??? HX HIP REPLACEMENT Right 01/29/2015   ??? HX HIP REPLACEMENT         Social History     Social History     Social History   ??? Marital status: MARRIED     Spouse name: N/A   ??? Number of children: N/A   ??? Years of education: N/A     Occupational History   ??? Not on file.     Social History Main Topics   ??? Smoking status: Never Smoker   ??? Smokeless tobacco: Never Used   ??? Alcohol use No   ??? Drug use: No   ???  Sexual activity: No     Other Topics Concern   ??? Not on file     Social History Narrative       Family History     Family History   Problem Relation Age of Onset   ??? Hypertension Mother    ??? Cancer Maternal Aunt    ??? Alcohol abuse Maternal Grandfather        Current Medications     (Not in a hospital admission)    Allergies     Allergies   Allergen Reactions   ??? Chocolate [Cocoa] Sneezing       Physical Exam   ED Triage Vitals   ED Encounter Vitals Group      BP 08/27/16 1941 156/100      Pulse --       Resp Rate 08/27/16 1941 22      Temp 08/27/16 1941 98.6 ??F (37 ??C)      Temp src --       O2 Sat (%) 08/27/16 1941 98 %      Weight 08/27/16 1934 175 lb      Height 08/27/16 1934 5' 7"        Constitutional: Patient appears well developed and well nourished. Appears nontoxic.   HENT: Normal cephalic and atraumatic.  Mucous membranes moist, non-erythematous and without exudates. Surface of the pharynx, palate, and  tongue are pink, moist and without lesions. Uvula is midline.  Eyes: Conjunctivae are clear with no discharge.  Neck: Normal ROM, supple and non tender. No nuchal rigidity.   Respiratory: Breath sounds are diminished and equal bilaterally with crackles.  Tachypneic with increased work of breathing.  Cardiovascular: Normal rate and rhythm. Calves soft and non-tender. Distal pulses 2+ and equal bilaterally.  No significant variscosities.    Gastrointestinal: Bowel sounds normal. Abdomen soft. diffusely tender with concentrated tenderness in LLQ. No distention or masses.   Musculoskeletal: Moves all extremities well. No lower extremity edema.   Lymphatic: No cervical adenopathy.  Integumentary: Warm and dry without rashes or erythema.  Neurologic: Alert and oriented. No facial asymmetry or dysarthria.  Psychological: Behavior is age and situation appropriate. No difficulty with memory.      Impression and Management Plan   This is a 66 y.o. female with abdominal pain, vomiting and diarrhea.  Patient is afebrile and not tachycardic or hypoxic. on exam, patient is tachypneic with increased work of breathing.  Breath sounds are diminished bilaterally with crackles.  Abdomen is soft without distention.  She reports diffuse tenderness that is concentrated in the left lower quadrant. will obtain appropriate labs and CXR. will treat symptomatically and give nebulizer treatments.    Diagnostic Studies   Lab:   Recent Results (from the past 12 hour(s))   CBC WITH AUTOMATED DIFF    Collection Time: 08/27/16  8:00 PM   Result Value Ref Range    WBC 15.5 (H) 4.0 - 11.0 1000/mm3    RBC 4.90 3.60 - 5.20 M/uL    HGB 14.4 13.0 - 17.2 gm/dl    HCT 42.6 37.0 - 50.0 %    MCV 86.9 80.0 - 98.0 fL    MCH 29.4 25.4 - 34.6 pg    MCHC 33.8 30.0 - 36.0 gm/dl    PLATELET 349 140 - 450 1000/mm3    MPV 10.5 (H) 6.0 - 10.0 fL    RDW-SD 42.5 36.4 - 46.3      NRBC 0 0 - 0  IMMATURE GRANULOCYTES 0.5 0.0 - 3.0 %    NEUTROPHILS 81.6 (H) 34 - 64 %     LYMPHOCYTES 13.3 (L) 28 - 48 %    MONOCYTES 4.1 1 - 13 %    EOSINOPHILS 0.2 0 - 5 %    BASOPHILS 0.3 0 - 3 %   METABOLIC PANEL, COMPREHENSIVE    Collection Time: 08/27/16  8:00 PM   Result Value Ref Range    Sodium 136 136 - 145 mEq/L    Potassium 4.1 3.5 - 5.1 mEq/L    Chloride 101 98 - 107 mEq/L    CO2 25 21 - 32 mEq/L    Glucose 187 (H) 74 - 106 mg/dl    BUN 16 7 - 25 mg/dl    Creatinine 0.9 0.6 - 1.3 mg/dl    GFR est AA >60.0      GFR est non-AA >60      Calcium 10.0 8.5 - 10.1 mg/dl    AST (SGOT) <3 (L) 15 - 37 U/L    ALT (SGPT) 13 12 - 78 U/L    Alk. phosphatase 92 45 - 117 U/L    Bilirubin, total 0.7 0.2 - 1.0 mg/dl    Protein, total 9.3 (H) 6.4 - 8.2 gm/dl    Albumin 4.3 3.4 - 5.0 gm/dl   LIPASE    Collection Time: 08/27/16  8:00 PM   Result Value Ref Range    Lipase 92 73 - 393 U/L   LACTIC ACID    Collection Time: 08/27/16  8:20 PM   Result Value Ref Range    Lactic Acid 1.9 0.4 - 2.0 mmol/L   POC URINE MACROSCOPIC    Collection Time: 08/27/16  9:09 PM   Result Value Ref Range    Glucose Negative NEGATIVE,Negative mg/dl    Bilirubin Negative NEGATIVE,Negative      Ketone 40 (A) NEGATIVE,Negative mg/dl    Specific gravity 1.020 1.005 - 1.030      Blood Trace-intact (A) NEGATIVE,Negative      pH (UA) 7.0 5 - 9      Protein 100 (A) NEGATIVE,Negative mg/dl    Urobilinogen 0.2 0.0 - 1.0 EU/dl    Nitrites Negative NEGATIVE,Negative      Leukocyte Esterase Small (A) NEGATIVE,Negative      Color Yellow      Appearance Clear     POC URINE MICROSCOPIC    Collection Time: 08/27/16  9:09 PM   Result Value Ref Range    Epithelial cells, squamous 5-9 /LPF    WBC 5-9 /HPF    RBC 5-9 /HPF    Bacteria 2+ /HPF     Labs Reviewed   CBC WITH AUTOMATED DIFF - Abnormal; Notable for the following:        Result Value    WBC 15.5 (*)     MPV 10.5 (*)     NEUTROPHILS 81.6 (*)     LYMPHOCYTES 13.3 (*)     All other components within normal limits   METABOLIC PANEL, COMPREHENSIVE - Abnormal; Notable for the following:      Glucose 187 (*)     AST (SGOT) <3 (*)     Protein, total 9.3 (*)     All other components within normal limits   POC URINE MACROSCOPIC - Abnormal; Notable for the following:     Ketone 40 (*)     Blood Trace-intact (*)     Protein 100 (*)     Leukocyte Esterase Small (*)  All other components within normal limits   CULTURE, URINE   LIPASE   LACTIC ACID   POC URINE MICROSCOPIC       Imaging:    Xr Chest Pa Lat    Result Date: 08/27/2016  Indication: Cough.     IMPRESSION: Chronic obstructive lung disease. Stable left lung nodule. AP upright and lateral views the chest are compared with August 16, 2016 and July 23, 2015. Comment: The lungs are hyperaerated from chronic obstructive disease. Nodular density is again noted in the left midlung field unchanged from prior studies. There is also linear lingular fibrosis. Remainder of the lungs are clear. The heart is of normal size.     Ct Abd Pelv W Cont    Result Date: 08/28/2016  Clinical history: Left lower quadrant pain EXAMINATION: CT scan of the abdomen and pelvis with intravenous contrast 08/27/2016. 5 mm spiral scanning is performed from the costophrenic angles to the symphysis pubis. Coronal and sagittal reconstruction imaging has been obtained. Correlation: 08/15/2016, 07/02/2015, 08/08/2014 FINDINGS: Lung bases demonstrate bullous changes. Degenerative changes of the spine. Right hip prosthesis. Liver, gallbladder, pancreas and adrenal glands are unremarkable. Several splenic hypodensities too small largest measures approximately 11 mm. Subcentimeter bilateral renal hypodensities too small to characterize. 13 mm hypodensity in mid pole right kidney unchanged from previous study. Nonobstructing bilateral nephrolithiasis, largest measures approximately 7 mm. Calcifications in the pelvis likely represents phleboliths. Uterus is absent. There are colonic diverticuli. Terminal ileum, appendix, stomach and small bowel loops are within normal limits.  Abdominal aorta is within normal limits. No dominant lymph node enlargement or free fluid. Fat-containing umbilical hernia.     IMPRESSION: 1. Diverticulosis. 2. Nonobstructing bilateral nephrolithiasis. 3. Hysterectomy. 4. Fat-containing umbilical hernia.       ED Course/Medical Decision Making   Patient did not develop any new or worsening symptoms and has remained stable during their course in the ED. Patient's labs remarkable for leukocytosis. Patient was feeling improved after IV Zofran and Pepcid, however was still complaining of diffuse tenderness with palpation. CT ABD/Pel was obtained which showed no acute abnormalities. Patient received IV fluids and was feeling improved, desiring to go home.   Patient notes breathing has improved after nebulizer. Lung sounds improved.  I feel the patient is stable for discharge at this time to follow up with primary care. Patient was instructed to return to the ER if condition worsens or new symptoms develop. The patient indicates understanding of these issues and agrees with the plan.     Final Diagnosis       ICD-10-CM ICD-9-CM   1. Nausea vomiting and diarrhea R11.2 787.91    R19.7 787.01   2. Abdominal pain, LLQ (left lower quadrant) R10.32 789.04       Disposition   Discharged in stable condition.       Patient has been evaluated by myself and Martin Majestic, MD who agrees with the above assessment and plan.    Fredda Hammed, PA-C  August 28, 2016  9:19 PM      My signature above authenticates this document and my orders, the final ??  diagnosis (es), discharge prescription (s), and instructions in the Epic ??  record.  If you have any questions please contact [phone].  ??  Nursing notes have been reviewed by the physician/ advanced practice ??  Clinician.

## 2016-08-28 LAB — CBC WITH AUTOMATED DIFF
BASOPHILS: 0.3 % (ref 0–3)
EOSINOPHILS: 0.2 % (ref 0–5)
HCT: 42.6 % (ref 37.0–50.0)
HGB: 14.4 gm/dl (ref 13.0–17.2)
IMMATURE GRANULOCYTES: 0.5 % (ref 0.0–3.0)
LYMPHOCYTES: 13.3 % — ABNORMAL LOW (ref 28–48)
MCH: 29.4 pg (ref 25.4–34.6)
MCHC: 33.8 gm/dl (ref 30.0–36.0)
MCV: 86.9 fL (ref 80.0–98.0)
MONOCYTES: 4.1 % (ref 1–13)
MPV: 10.5 fL — ABNORMAL HIGH (ref 6.0–10.0)
NEUTROPHILS: 81.6 % — ABNORMAL HIGH (ref 34–64)
NRBC: 0 (ref 0–0)
PLATELET: 349 10*3/uL (ref 140–450)
RBC: 4.9 M/uL (ref 3.60–5.20)
RDW-SD: 42.5 (ref 36.4–46.3)
WBC: 15.5 10*3/uL — ABNORMAL HIGH (ref 4.0–11.0)

## 2016-08-28 LAB — POC URINE MACROSCOPIC
Bilirubin: NEGATIVE
Glucose: NEGATIVE mg/dl
Ketone: 40 mg/dl — AB
Nitrites: NEGATIVE
Protein: 100 mg/dl — AB
Specific gravity: 1.02 (ref 1.005–1.030)
Urobilinogen: 0.2 EU/dl (ref 0.0–1.0)
pH (UA): 7 (ref 5–9)

## 2016-08-28 LAB — METABOLIC PANEL, COMPREHENSIVE
ALT (SGPT): 13 U/L (ref 12–78)
AST (SGOT): <3 U/L — ABNORMAL LOW (ref 15–37)
Albumin: 4.3 gm/dl (ref 3.4–5.0)
Alk. phosphatase: 92 U/L (ref 45–117)
BUN: 16 mg/dl (ref 7–25)
Bilirubin, total: 0.7 mg/dl (ref 0.2–1.0)
CO2: 25 mEq/L (ref 21–32)
Calcium: 10 mg/dl (ref 8.5–10.1)
Chloride: 101 mEq/L (ref 98–107)
Creatinine: 0.9 mg/dl (ref 0.6–1.3)
GFR est AA: >60
GFR est non-AA: >60
Glucose: 187 mg/dl — ABNORMAL HIGH (ref 74–106)
Potassium: 4.1 mEq/L (ref 3.5–5.1)
Protein, total: 9.3 gm/dl — ABNORMAL HIGH (ref 6.4–8.2)
Sodium: 136 mEq/L (ref 136–145)

## 2016-08-28 LAB — LACTIC ACID: Lactic Acid: 1.9 mmol/L (ref 0.4–2.0)

## 2016-08-28 LAB — POC URINE MICROSCOPIC

## 2016-08-28 LAB — LIPASE: Lipase: 92 U/L (ref 73–393)

## 2016-08-28 MED ORDER — IOPAMIDOL 76 % IV SOLN
370 mg iodine /mL (76 %) | Freq: Once | INTRAVENOUS | Status: AC
Start: 2016-08-28 — End: 2016-08-27
  Administered 2016-08-28: 04:00:00 via INTRAVENOUS

## 2016-08-28 MED ORDER — DICYCLOMINE 10 MG/ML IM SOLN
10 mg/mL | INTRAMUSCULAR | Status: AC
Start: 2016-08-28 — End: 2016-08-27
  Administered 2016-08-28: 03:00:00 via INTRAMUSCULAR

## 2016-08-28 MED ORDER — PROMETHAZINE 25 MG/ML INJECTION
25 mg/mL | INTRAMUSCULAR | Status: AC
Start: 2016-08-28 — End: 2016-08-27
  Administered 2016-08-28: 01:00:00 via INTRAMUSCULAR

## 2016-08-28 MED ORDER — SODIUM CHLORIDE 0.9% BOLUS IV
0.9 % | INTRAVENOUS | Status: AC
Start: 2016-08-28 — End: 2016-08-28
  Administered 2016-08-28: 03:00:00 via INTRAVENOUS

## 2016-08-28 MED ORDER — FAMOTIDINE (PF) 20 MG/2 ML IV
202 mg/2 mL | INTRAVENOUS | Status: AC
Start: 2016-08-28 — End: 2016-08-27
  Administered 2016-08-28: via INTRAVENOUS

## 2016-08-28 MED ORDER — IPRATROPIUM-ALBUTEROL 2.5 MG-0.5 MG/3 ML NEB SOLUTION
2.5 mg-0.5 mg/3 ml | RESPIRATORY_TRACT | Status: AC
Start: 2016-08-28 — End: 2016-08-27
  Administered 2016-08-28: 01:00:00 via RESPIRATORY_TRACT

## 2016-08-28 MED ORDER — SODIUM CHLORIDE 0.9 % IJ SYRG
Freq: Once | INTRAMUSCULAR | Status: AC
Start: 2016-08-28 — End: 2016-08-27
  Administered 2016-08-28: via INTRAVENOUS

## 2016-08-28 MED ORDER — SODIUM CHLORIDE 0.9 % IJ SYRG
Freq: Once | INTRAMUSCULAR | Status: AC
Start: 2016-08-28 — End: 2016-08-27
  Administered 2016-08-28: 04:00:00 via INTRAVENOUS

## 2016-08-28 MED ORDER — PROMETHAZINE IN NS 25 MG/50 ML IV PIGGY BAG
25 mg/50 ml | INTRAVENOUS | Status: DC
Start: 2016-08-28 — End: 2016-08-27

## 2016-08-28 MED FILL — ISOVUE-370  76 % INTRAVENOUS SOLUTION: 370 mg iodine /mL (76 %) | INTRAVENOUS | Qty: 80

## 2016-08-28 MED FILL — BD POSIFLUSH NORMAL SALINE 0.9 % INJECTION SYRINGE: INTRAMUSCULAR | Qty: 10

## 2016-08-28 MED FILL — IPRATROPIUM-ALBUTEROL 2.5 MG-0.5 MG/3 ML NEB SOLUTION: 2.5 mg-0.5 mg/3 ml | RESPIRATORY_TRACT | Qty: 3

## 2016-08-28 MED FILL — PROMETHAZINE 25 MG/ML INJECTION: 25 mg/mL | INTRAMUSCULAR | Qty: 1

## 2016-08-28 MED FILL — SODIUM CHLORIDE 0.9 % INJECTION: INTRAMUSCULAR | Qty: 10

## 2016-08-28 MED FILL — BENTYL 10 MG/ML INTRAMUSCULAR SOLUTION: 10 mg/mL | INTRAMUSCULAR | Qty: 2

## 2016-08-28 MED FILL — FAMOTIDINE (PF) 20 MG/2 ML IV: 20 mg/2 mL | INTRAVENOUS | Qty: 2

## 2016-08-29 ENCOUNTER — Ambulatory Visit: Admit: 2016-08-29 | Discharge: 2016-08-29 | Payer: MEDICARE | Attending: Family Medicine | Primary: Physician Assistant

## 2016-08-29 DIAGNOSIS — R112 Nausea with vomiting, unspecified: Secondary | ICD-10-CM

## 2016-08-29 LAB — CULTURE, URINE
CULTURE RESULT: >100000 — AB
Culture result: >100000 — AB

## 2016-08-29 MED ORDER — ONDANSETRON 8 MG TAB, RAPID DISSOLVE
8 mg | ORAL_TABLET | Freq: Three times a day (TID) | ORAL | 1 refills | Status: DC | PRN
Start: 2016-08-29 — End: 2017-01-09

## 2016-08-29 NOTE — Progress Notes (Signed)
Hospital Follow Up        HPI: [name] [name] is a 66 y.o. female [place]  Here in follow up of ER visit for N/V on 08/27/16. She continues to feel nauseous but has not had vomiting since ER visit. She does report some feeling of SOB and wet cough.  She denies fevers. No bloody/ black colored stools. Her UDS in the ER was + for [facility]. She underwent Chest x-ray and abdominal CT neither of which were + for acute pathology that would explain her symptoms.  She states she uses marijuana for her chronic pain and has not used this since ER visit.        Past Medical History:   Diagnosis Date   ??? Arthritis    ??? Chronic pain    ??? Diabetes ([place])    ??? GERD (gastroesophageal reflux disease)    ??? Hypertension    ??? Liver disease     HEP C   ??? PTSD (post-traumatic stress disorder)     lived through the [place] in 1993       Current Outpatient Prescriptions   Medication Sig   ??? ondansetron (ZOFRAN ODT) 8 mg disintegrating tablet Take 1 Tab by mouth every eight (8) hours as needed for Nausea.   ??? dicyclomine (BENTYL) 20 mg tablet Take 1 Tab by mouth every six (6) hours for 20 doses.   ??? promethazine (PHENERGAN) 25 mg tablet Take 1 Tab by mouth every six (6) hours as needed.   ??? QUEtiapine SR (SEROQUEL XR) 150 mg sr tablet TAKE 1 TABLET BY MOUTH NIGHTLY   ??? oxyCODONE-acetaminophen (PERCOCET) 10-325 mg per tablet Take 1 Tab by mouth every six (6) hours as needed for Pain. Max Daily Amount: 4 Tabs. Takes for chronic hip pain   ??? polyethylene glycol (MIRALAX) 17 gram packet Take 1 Packet by mouth daily.   ??? promethazine (PHENERGAN) 12.5 mg tablet Take 1 Tab by mouth every six (6) hours as needed for Nausea.   ??? traZODone (DESYREL) 100 mg tablet TAKE 1 TABLET BY MOUTH NIGHTLY   ??? spironolactone (ALDACTONE) 100 mg tablet TAKE 1 TABLET BY MOUTH DAILY   ??? losartan (COZAAR) 100 mg tablet Take 1 Tab by mouth daily. Indications: hypertension    ??? gabapentin (NEURONTIN) 600 mg tablet TAKE 1 TABLET BY MOUTH THREE TIMES DAILY   ??? albuterol (PROVENTIL HFA, VENTOLIN HFA, PROAIR HFA) 90 mcg/actuation inhaler Take 2 Puffs by inhalation every four (4) hours as needed for Wheezing.   ??? amLODIPine (NORVASC) 10 mg tablet TAKE ONE TABLET BY MOUTH ONCE DAILY   ??? rosuvastatin (CRESTOR) 5 mg tablet Take 1 Tab by mouth nightly.   ??? metFORMIN ER (GLUCOPHAGE XR) 750 mg tablet TAKE 2 TABLETS EVERY DAY   ??? cloNIDine HCl (CATAPRES) 0.3 mg tablet TAKE ONE TABLET BY MOUTH TWICE DAILY   ??? magnesium oxide (MAG-OX) 400 mg tablet TAKE 1 TABLET BY MOUTH DAILY   ??? Blood-Glucose Meter monitoring kit Check sugars 2 times per day, fasting and 2 hours after dinner   ??? Lancets misc Check sugars twice daily   ??? glucose blood VI test strips (ASCENSIA AUTODISC VI, ONE TOUCH ULTRA TEST VI) strip Check sugars twice daily     No current facility-administered medications for this visit.        Allergies   Allergen Reactions   ??? Chocolate [Cocoa] Sneezing       Past Surgical History:  Procedure Laterality Date   ??? HX ENDOSCOPY     ??? HX GYN  2010    hysterectomy   ??? HX HIP REPLACEMENT Right 01/29/2015   ??? HX HIP REPLACEMENT         Family History   Problem Relation Age of Onset   ??? Hypertension Mother    ??? Cancer Maternal Aunt    ??? Alcohol abuse Maternal Grandfather        Social History     Social History   ??? Marital status: MARRIED     Spouse name: N/A   ??? Number of children: N/A   ??? Years of education: N/A     Occupational History   ??? Not on file.     Social History Main Topics   ??? Smoking status: Never Smoker   ??? Smokeless tobacco: Never Used   ??? Alcohol use No   ??? Drug use: No   ??? Sexual activity: No     Other Topics Concern   ??? Not on file     Social History Narrative       Gen- No weight loss, No Malaise, No fatigue  Eyes- No dipoplia, blurry vision  CVS- No Chest pain, no palpitations, no edema  Neuro- No headaches  Skin- No easy bruising, No rashes      Visit Vitals    ??? BP (!) 136/107 (BP 1 Location: Right arm, BP Patient Position: Sitting)   ??? Pulse (!) 109   ??? Temp 98.4 ??F (36.9 ??C) (Oral)   ??? Resp 20   ??? Ht 5' 7"  (1.702 m)   ??? Wt 187 lb (84.8 kg)   ??? SpO2 98%   ??? BMI 29.29 kg/m2       Physical Exam   Constitutional: She is oriented to person, place, and time. She appears well-developed and well-nourished. No distress.   HENT:   Head: Normocephalic and atraumatic.   Right Ear: External ear normal.   Left Ear: External ear normal.   Cardiovascular: Normal rate, regular rhythm and normal heart sounds.    No murmur heard.  Pulmonary/Chest: Effort normal and breath sounds normal. No respiratory distress. She has no wheezes. She has no rales.   Abdominal: Soft. Bowel sounds are normal. She exhibits no distension. There is no tenderness. There is no rebound and no guarding.   Neurological: She is alert and oriented to person, place, and time.   Skin: Skin is warm and dry. She is not diaphoretic.   Psychiatric: She has a normal mood and affect. Her behavior is normal.           Assesment:    ICD-10-CM ICD-9-CM    1. Nausea and vomiting, intractability of vomiting not specified, unspecified vomiting type R11.2 787.01 ondansetron (ZOFRAN ODT) 8 mg disintegrating tablet   2. Shortness of breath R06.02 786.05    3. Essential hypertension I10 401.9      1. Unclear etiology of N/V. She denies constipation. Suspect 2/2 recent THC use. Will treat with zofran. Advised to avoid cannabis use.    2.  Lungs clear on exam. Recent x-ray negative for acute pathology. O2 sat 98%.  Likely secondary to feelings of Nausea and recurrent vomiting    3.  Uncontrolled today. Has been unable to keep medication down to recent nausea.          I have discussed the diagnosis with the patient and the intended management  The patient has received an after-visit summary  and questions were answered concerning future plans.  I have discussed medication usage,  side effects and warnings with the patient as well.I have reviewed the plan of care with the patient, accepted their input and they are in agreement with the treatment goals.         Follow-up Disposition:  Return if symptoms worsen or fail to improve.      Lake Bells, MD                .

## 2016-08-29 NOTE — Patient Instructions (Addendum)
Use zofran three times a day as needed for nausea         Nausea and Vomiting: Care Instructions  Your Care Instructions    When you are nauseated, you may feel weak and sweaty and notice a lot of saliva in your mouth. Nausea often leads to vomiting. Most of the time you do not need to worry about nausea and vomiting, but they can be signs of other illnesses.  Two common causes of nausea and vomiting are stomach flu and food poisoning. Nausea and vomiting from viral stomach flu will usually start to improve within 24 hours. Nausea and vomiting from food poisoning may last from 12 to 48 hours.  The doctor has checked you carefully, but problems can develop later. If you notice any problems or new symptoms, get medical treatment right away.  Follow-up care is a key part of your treatment and safety. Be sure to make and go to all appointments, and call your doctor if you are having problems. It's also a good idea to know your test results and keep a list of the medicines you take.  How can you care for yourself at home?  ?? To prevent dehydration, drink plenty of fluids, enough so that your urine is light yellow or clear like water. Choose water and other caffeine-free clear liquids until you feel better. If you have kidney, heart, or liver disease and have to limit fluids, talk with your doctor before you increase the amount of fluids you drink.  ?? Rest in bed until you feel better.  ?? When you are able to eat, try clear soups, mild foods, and liquids until all symptoms are gone for 12 to 48 hours. Other good choices include dry toast, crackers, cooked cereal, and gelatin dessert, such as Jell-O.  When should you call for help?  Call 911 anytime you think you may need emergency care. For example, call if:  ? ?? You passed out (lost consciousness).   ?Call your doctor now or seek immediate medical care if:  ? ?? You have symptoms of dehydration, such as:  ?? Dry eyes and a dry mouth.  ?? Passing only a little dark urine.   ?? Feeling thirstier than usual.   ? ?? You have new or worsening belly pain.   ? ?? You have a new or higher fever.   ? ?? You vomit blood or what looks like coffee grounds.   ?Watch closely for changes in your health, and be sure to contact your doctor if:  ? ?? You have ongoing nausea and vomiting.   ? ?? Your vomiting is getting worse.   ? ?? Your vomiting lasts longer than 2 days.   ? ?? You are not getting better as expected.   Where can you learn more?  Go to [employer]Stats[employer]cahttp://www.healthwise.net/[employer].  Enter H591 in the search box to learn more about "Nausea and Vomiting: Care Instructions."  Current as of: August 31, 2015  Content Version: 11.4  ?? 2006-2017 [employer]. Care instructions adapted under license by [employer] (which disclaims liability or warranty for this information). If you have questions about a medical condition or this instruction, always ask your healthcare professional. [employer] disclaims any warranty or liability for your use of this information.

## 2016-08-29 NOTE — Progress Notes (Signed)
Anne ShutterMonica R Leeds is a 66 y.o. female here today for a hospital follow up on vomiting and diarrhea She last threw up a couple of days ago. She has a very wet productive cough and states she is having hard time breathing. O2 is at 99%.    Learning assessmenly previously completed.    1. Have you been to the ER, urgent care clinic or hospitalized since your last visit? [facility]    Do you have an Advanced Directive? yes    Would you like information on Advanced Directives? no

## 2016-09-01 ENCOUNTER — Inpatient Hospital Stay
Admit: 2016-09-01 | Discharge: 2016-09-01 | Disposition: A | Discharge status: Home / Self Care | Payer: MEDICARE | Attending: Emergency Medicine

## 2016-09-01 DIAGNOSIS — R1013 Epigastric pain: Secondary | ICD-10-CM

## 2016-09-01 LAB — EKG 12-LEAD
Atrial Rate: 113 {beats}/min
P Axis: 73 degrees
P-R Interval: 166 ms
Q-T Interval: 328 ms
QRS Duration: 74 ms
QTc Calculation (Bazett): 449 ms
R Axis: 58 degrees
T Axis: 57 degrees
Ventricular Rate: 113 {beats}/min

## 2016-09-01 LAB — DRUG SCREEN, URINE
Amphetamine: NEGATIVE
Barbiturates: NEGATIVE
Benzodiazepines: NEGATIVE
Cocaine: NEGATIVE
Marijuana: POSITIVE — AB
Methadone: NEGATIVE
Opiates: NEGATIVE
Phencyclidine: NEGATIVE

## 2016-09-01 LAB — CBC WITH AUTOMATED DIFF
BASOPHILS: 0.3 % (ref 0–3)
EOSINOPHILS: 0.2 % (ref 0–5)
HCT: 41.7 % (ref 37.0–50.0)
HGB: 14.3 gm/dl (ref 13.0–17.2)
IMMATURE GRANULOCYTES: 0.4 % (ref 0.0–3.0)
LYMPHOCYTES: 16.4 % — ABNORMAL LOW (ref 28–48)
MCH: 29.7 pg (ref 25.4–34.6)
MCHC: 34.3 gm/dl (ref 30.0–36.0)
MCV: 86.7 fL (ref 80.0–98.0)
MONOCYTES: 3.6 % (ref 1–13)
MPV: 11 fL — ABNORMAL HIGH (ref 6.0–10.0)
NEUTROPHILS: 79.1 % — ABNORMAL HIGH (ref 34–64)
NRBC: 0 (ref 0–0)
PLATELET: 299 10*3/uL (ref 140–450)
RBC: 4.81 M/uL (ref 3.60–5.20)
RDW-SD: 41.8 (ref 36.4–46.3)
WBC: 14.9 10*3/uL — ABNORMAL HIGH (ref 4.0–11.0)

## 2016-09-01 LAB — EKG, 12 LEAD, INITIAL
Atrial Rate: 113 {beats}/min
Calculated P Axis: 73 degrees
Calculated R Axis: 58 degrees
Calculated T Axis: 57 degrees
P-R Interval: 166 ms
Q-T Interval: 328 ms
QRS Duration: 74 ms
QTC Calculation (Bezet): 449 ms
Ventricular Rate: 113 {beats}/min

## 2016-09-01 LAB — POC URINE MICROSCOPIC: Epithelial cells, squamous: >50 /LPF

## 2016-09-01 LAB — METABOLIC PANEL, COMPREHENSIVE
ALT (SGPT): 16 U/L (ref 12–78)
AST (SGOT): 9 U/L — ABNORMAL LOW (ref 15–37)
Albumin: 4.1 gm/dl (ref 3.4–5.0)
Alk. phosphatase: 88 U/L (ref 45–117)
BUN: 21 mg/dl (ref 7–25)
Bilirubin, total: 0.6 mg/dl (ref 0.2–1.0)
CO2: 27 mEq/L (ref 21–32)
Calcium: 9.5 mg/dl (ref 8.5–10.1)
Chloride: 104 mEq/L (ref 98–107)
Creatinine: 1 mg/dl (ref 0.6–1.3)
GFR est AA: >60
GFR est non-AA: 59
Glucose: 198 mg/dl — ABNORMAL HIGH (ref 74–106)
Potassium: 3.7 mEq/L (ref 3.5–5.1)
Protein, total: 9.1 gm/dl — ABNORMAL HIGH (ref 6.4–8.2)
Sodium: 138 mEq/L (ref 136–145)

## 2016-09-01 LAB — POC URINE MACROSCOPIC
Glucose: NEGATIVE mg/dl
Nitrites: NEGATIVE
Protein: 100 mg/dl — AB
Specific gravity: 1.025 (ref 1.005–1.030)
Urobilinogen: 0.2 EU/dl (ref 0.0–1.0)
pH (UA): 6.5 (ref 5–9)

## 2016-09-01 LAB — LACTIC ACID: Lactic Acid: 1.8 mmol/L (ref 0.4–2.0)

## 2016-09-01 LAB — LIPASE: Lipase: 159 U/L (ref 73–393)

## 2016-09-01 MED ORDER — DIPHENHYDRAMINE HCL 50 MG/ML IJ SOLN
50 mg/mL | INTRAMUSCULAR | Status: AC
Start: 2016-09-01 — End: 2016-09-01
  Administered 2016-09-01: 12:00:00 via INTRAVENOUS

## 2016-09-01 MED ORDER — METOCLOPRAMIDE 5 MG/ML IJ SOLN
5 mg/mL | INTRAMUSCULAR | Status: AC
Start: 2016-09-01 — End: 2016-09-01
  Administered 2016-09-01: 12:00:00 via INTRAVENOUS

## 2016-09-01 MED ORDER — HALOPERIDOL LACTATE 5 MG/ML IJ SOLN
5 mg/mL | INTRAMUSCULAR | Status: AC
Start: 2016-09-01 — End: 2016-09-01
  Administered 2016-09-01: 13:00:00 via INTRAVENOUS

## 2016-09-01 MED ORDER — SODIUM CHLORIDE 0.9% BOLUS IV
0.9 % | INTRAVENOUS | Status: AC
Start: 2016-09-01 — End: 2016-09-01
  Administered 2016-09-01: 12:00:00 via INTRAVENOUS

## 2016-09-01 MED ORDER — DICYCLOMINE 20 MG TAB
20 mg | ORAL_TABLET | Freq: Four times a day (QID) | ORAL | 0 refills | Status: AC
Start: 2016-09-01 — End: 2016-09-06

## 2016-09-01 MED ORDER — KETOROLAC TROMETHAMINE 30 MG/ML INJECTION
30 mg/mL (1 mL) | INTRAMUSCULAR | Status: AC
Start: 2016-09-01 — End: 2016-09-01
  Administered 2016-09-01: 16:00:00 via INTRAVENOUS

## 2016-09-01 MED ORDER — PROMETHAZINE 25 MG TAB
25 mg | ORAL_TABLET | Freq: Four times a day (QID) | ORAL | 0 refills | Status: DC | PRN
Start: 2016-09-01 — End: 2017-01-09

## 2016-09-01 MED FILL — SODIUM CHLORIDE 0.9 % IV: INTRAVENOUS | Qty: 50

## 2016-09-01 MED FILL — HALOPERIDOL LACTATE 5 MG/ML IJ SOLN: 5 mg/mL | INTRAMUSCULAR | Qty: 1

## 2016-09-01 MED FILL — METOCLOPRAMIDE 5 MG/ML IJ SOLN: 5 mg/mL | INTRAMUSCULAR | Qty: 2

## 2016-09-01 MED FILL — KETOROLAC TROMETHAMINE 30 MG/ML INJECTION: 30 mg/mL (1 mL) | INTRAMUSCULAR | Qty: 1

## 2016-09-01 MED FILL — DIPHENHYDRAMINE HCL 50 MG/ML IJ SOLN: 50 mg/mL | INTRAMUSCULAR | Qty: 1

## 2016-09-01 NOTE — ED Provider Notes (Signed)
[facility]  Emergency Department Treatment Report    Patient: [name] [name] Age: 66 y.o. Sex: female    Date of Birth: 05-25-1951 Admit Date: 09/01/2016 PCP: Lake Bells, MD   MRN: [id number]  CSN: [id number]  Attending: Joice Lofts, MD   Room: LY65/KP54 Time Dictated: 7:52 AM APP: Cathe Mons, PA-C     Chief Complaint   Chief Complaint   Patient presents with   ??? Abdominal Pain       History of Present Illness   66 y.o. female presents with an 8 out of 10 aching epigastric abdominal pain that began this morning.  She's been nauseated denies vomiting or diarrhea.  No fevers.  She has a history of diabetes, she does not check her blood glucose at home.  Prior abdominal surgeries include a hysterectomy.    Patient has a history of chronic pain.  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    Patient was recently seen in our facility 5 days ago.  At that time she had a CT abdomen and pelvis that showed diverticulosis, nonobstructive bilateral nephrolithiasis, hysterectomy, and a fat-containing umbilical hernia.    Review of Systems   Constitutifonal: No fevers  Eyes: No visual symptoms.  ENT: No URI symptoms  Respiratory: No difficulty breathing  Cardoivascular: No chest pain  Gastrointenstinal: Positive for abdominal pain, nausea, no vomiting or diarrhea  Genitourinary: No urinary symptoms.  Musculoskeletal: No joint pain  Integumentary: No rashes.  Neurological: No headaches    Past Medical/Surgical History     Past Medical History:   Diagnosis Date   ??? Arthritis    ??? Chronic pain    ??? Diabetes ([employer])    ??? GERD (gastroesophageal reflux disease)    ??? Hypertension    ??? Liver disease     HEP C   ??? PTSD (post-traumatic stress disorder)     lived through the [place] in 1993     Past Surgical History:   Procedure Laterality Date   ??? HX ENDOSCOPY     ??? HX GYN  2010    hysterectomy   ??? HX HIP REPLACEMENT Right 01/29/2015   ??? HX HIP REPLACEMENT          Social History     Social History     Social History   ??? Marital status: MARRIED     Spouse name: N/A   ??? Number of children: N/A   ??? Years of education: N/A     Social History Main Topics   ??? Smoking status: Never Smoker   ??? Smokeless tobacco: Never Used   ??? Alcohol use No   ??? Drug use: No   ??? Sexual activity: No     Other Topics Concern   ??? None     Social History Narrative       Family History     Family History   Problem Relation Age of Onset   ??? Hypertension Mother    ??? Cancer Maternal Aunt    ??? Alcohol abuse Maternal Grandfather        Home Medications     Prior to Admission Medications   Prescriptions Last Dose Informant Patient Reported? Taking?   Blood-Glucose Meter monitoring kit   No No   Sig: Check sugars 2 times per day, fasting and 2 hours after dinner   Lancets misc   No No   Sig: Check sugars twice  daily   QUEtiapine SR (SEROQUEL XR) 150 mg sr tablet   No No   Sig: TAKE 1 TABLET BY MOUTH NIGHTLY   albuterol (PROVENTIL HFA, VENTOLIN HFA, PROAIR HFA) 90 mcg/actuation inhaler   No No   Sig: Take 2 Puffs by inhalation every four (4) hours as needed for Wheezing.   amLODIPine (NORVASC) 10 mg tablet   No No   Sig: TAKE ONE TABLET BY MOUTH ONCE DAILY   cloNIDine HCl (CATAPRES) 0.3 mg tablet   No No   Sig: TAKE ONE TABLET BY MOUTH TWICE DAILY   gabapentin (NEURONTIN) 600 mg tablet   No No   Sig: TAKE 1 TABLET BY MOUTH THREE TIMES DAILY   glucose blood VI test strips (ASCENSIA AUTODISC VI, ONE TOUCH ULTRA TEST VI) strip   No No   Sig: Check sugars twice daily   losartan (COZAAR) 100 mg tablet   No No   Sig: Take 1 Tab by mouth daily. Indications: hypertension   magnesium oxide (MAG-OX) 400 mg tablet   No No   Sig: TAKE 1 TABLET BY MOUTH DAILY   metFORMIN ER (GLUCOPHAGE XR) 750 mg tablet   No No   Sig: TAKE 2 TABLETS EVERY DAY   ondansetron (ZOFRAN ODT) 8 mg disintegrating tablet   No No   Sig: Take 1 Tab by mouth every eight (8) hours as needed for Nausea.    oxyCODONE-acetaminophen (PERCOCET) 10-325 mg per tablet   No No   Sig: Take 1 Tab by mouth every six (6) hours as needed for Pain. Max Daily Amount: 4 Tabs. Takes for chronic hip pain   polyethylene glycol (MIRALAX) 17 gram packet   No No   Sig: Take 1 Packet by mouth daily.   promethazine (PHENERGAN) 12.5 mg tablet   No No   Sig: Take 1 Tab by mouth every six (6) hours as needed for Nausea.   rosuvastatin (CRESTOR) 5 mg tablet   No No   Sig: Take 1 Tab by mouth nightly.   spironolactone (ALDACTONE) 100 mg tablet   No No   Sig: TAKE 1 TABLET BY MOUTH DAILY   traZODone (DESYREL) 100 mg tablet   No No   Sig: TAKE 1 TABLET BY MOUTH NIGHTLY      Facility-Administered Medications: None       Allergies     Allergies   Allergen Reactions   ??? Chocolate [Cocoa] Sneezing     Physical Exam     Visit Vitals   ??? BP (!) 148/95   ??? Pulse (!) 104   ??? Temp 98.5 ??F (36.9 ??C)   ??? Resp 16   ??? Ht 5' 7" (1.702 m)   ??? Wt 79.4 kg (175 lb)   ??? SpO2 100%   ??? BMI 27.41 kg/m2     Constitutional: Patient appears well developed and well nourished.  Appearance and behavior are age and situation appropriate.  HEENT: Conjunctiva clear.  PERRLA. Mucous membranes moist, non-erythematous. Surface of the pharynx, palate, and tongue are pink, moist and without lesions.  Respiratory: lungs clear to auscultation,no respiratory distress. No tachypnea or accessory muscle use.  Cardiovascular: heart regular rate and rhythm without murmur rubs or gallops.   Vascular: Calves soft and non-tender. Distal pulses 2+ and equal bilaterally.  No peripheral edema or significant variscosities.    Gastrointestinal:  Abdomen soft, tenderness in the epigastric area. No distention, organomegaly, or masses.   Musculoskeletal: Moving extremities appropriately.  Integumentary: No rashes noted.  Neurologic: alert and oriented, answering questions appropriately.     Impression and Management Plan   A 66 year old female presents for evaluation of epigastric abdominal pain  with nausea.  At this time we'll obtain GI labs.  We will also do screening EKG given that her pain is upper abdominal.  I suspect that this is her chronic pain.  She could also have a component of gastroparesis given that she has a history of diabetes.  At this time we will treat with Benadryl and Reglan.  Diagnostic Studies   Lab:     Recent Results (from the past 24 hour(s))   POC URINE MACROSCOPIC    Collection Time: 09/01/16  7:34 AM   Result Value Ref Range    Glucose Negative NEGATIVE,Negative mg/dl    Bilirubin Small (A) NEGATIVE,Negative      Ketone Trace (A) NEGATIVE,Negative mg/dl    Specific gravity 1.025 1.005 - 1.030      Blood Trace-intact (A) NEGATIVE,Negative      pH (UA) 6.5 5 - 9      Protein 100 (A) NEGATIVE,Negative mg/dl    Urobilinogen 0.2 0.0 - 1.0 EU/dl    Nitrites Negative NEGATIVE,Negative      Leukocyte Esterase Trace (A) NEGATIVE,Negative      Color Yellow      Appearance Clear     POC URINE MICROSCOPIC    Collection Time: 09/01/16  7:34 AM   Result Value Ref Range    Epithelial cells, squamous >50 /LPF    WBC 1-4 /HPF    RBC OCCASIONAL /HPF    Mucus PRESENT      Bacteria 3+ /HPF   CBC WITH AUTOMATED DIFF    Collection Time: 09/01/16  8:10 AM   Result Value Ref Range    WBC 14.9 (H) 4.0 - 11.0 1000/mm3    RBC 4.81 3.60 - 5.20 M/uL    HGB 14.3 13.0 - 17.2 gm/dl    HCT 41.7 37.0 - 50.0 %    MCV 86.7 80.0 - 98.0 fL    MCH 29.7 25.4 - 34.6 pg    MCHC 34.3 30.0 - 36.0 gm/dl    PLATELET 299 140 - 450 1000/mm3    MPV 11.0 (H) 6.0 - 10.0 fL    RDW-SD 41.8 36.4 - 46.3      NRBC 0 0 - 0      IMMATURE GRANULOCYTES 0.4 0.0 - 3.0 %    NEUTROPHILS 79.1 (H) 34 - 64 %    LYMPHOCYTES 16.4 (L) 28 - 48 %    MONOCYTES 3.6 1 - 13 %    EOSINOPHILS 0.2 0 - 5 %    BASOPHILS 0.3 0 - 3 %   METABOLIC PANEL, COMPREHENSIVE    Collection Time: 09/01/16  8:10 AM   Result Value Ref Range    Sodium 138 136 - 145 mEq/L    Potassium 3.7 3.5 - 5.1 mEq/L    Chloride 104 98 - 107 mEq/L    CO2 27 21 - 32 mEq/L     Glucose 198 (H) 74 - 106 mg/dl    BUN 21 7 - 25 mg/dl    Creatinine 1.0 0.6 - 1.3 mg/dl    GFR est AA >60.0      GFR est non-AA 59      Calcium 9.5 8.5 - 10.1 mg/dl    AST (SGOT) 9 (L) 15 - 37 U/L    ALT (SGPT) 16 12 - 78 U/L  Alk. phosphatase 88 45 - 117 U/L    Bilirubin, total 0.6 0.2 - 1.0 mg/dl    Protein, total 9.1 (H) 6.4 - 8.2 gm/dl    Albumin 4.1 3.4 - 5.0 gm/dl   LIPASE    Collection Time: 09/01/16  8:10 AM   Result Value Ref Range    Lipase 159 73 - 393 U/L   LACTIC ACID    Collection Time: 09/01/16  8:10 AM   Result Value Ref Range    Lactic Acid 1.8 0.4 - 2.0 mmol/L         ED Course     Medications   sodium chloride 0.9 % bolus infusion 1,000 mL (1,000 mL IntraVENous New Bag 09/01/16 0811)   diphenhydrAMINE (BENADRYL) injection 25 mg (25 mg IntraVENous Given 09/01/16 2585)   metoclopramide HCl (REGLAN) 10 mg in 0.9% sodium chloride 50 mL IVPB (10 mg IntraVENous Given 09/01/16 0814)   haloperidol lactate (HALDOL) injection 5 mg (5 mg IntraVENous Given 09/01/16 2778)       Medical Decision Making   67 year old female with a history of chronic abdominal pain presented with upper abdominal pain with nausea and vomiting.  She looks well.  She is nontoxic.  I think this is an exacerbation of her chronic pain and she is about to GI.  We will give her prescriptions for Bentyl and Zofran.  At this point all of her labs are on remarkable.  Her urine at today appears to be infected, she was seen here the other day and had urine culture done but asked for repeat culture given multiple species seen.  We repeated a urine culture today.  At this time she has no urinary symptoms and we will not treat her for this.  Final Diagnosis     1. Chronic abdominal pain    2. Non-intractable vomiting with nausea, unspecified vomiting type          Disposition   Patient discharged home in stable condition. Advised to follow up with their PCP and GI. Patient advised to return to the ED for any new or worsening symptoms.        My Medications      START taking these medications       Instructions Each Dose to Equal   Morning Noon Evening Bedtime    dicyclomine 20 mg tablet   Commonly known as:  BENTYL       Your last dose was:         Your next dose is:              Take 1 Tab by mouth every six (6) hours for 20 doses.    20 mg                          CHANGE how you take these medications       Instructions Each Dose to Equal   Morning Noon Evening Bedtime    * promethazine 12.5 mg tablet   Commonly known as:  PHENERGAN   What changed:  Another medication with the same name was added. Make sure you understand how and when to take each.       Your last dose was:         Your next dose is:              Take 1 Tab by mouth every six (6) hours as needed for Nausea.  12.5 mg                        * promethazine 25 mg tablet   Commonly known as:  PHENERGAN   What changed:  You were already taking a medication with the same name, and this prescription was added. Make sure you understand how and when to take each.       Your last dose was:         Your next dose is:              Take 1 Tab by mouth every six (6) hours as needed.    25 mg                        * Notice:  This list has 2 medication(s) that are the same as other medications prescribed for you. Read the directions carefully, and ask your doctor or other care provider to review them with you.      ASK your doctor about these medications       Instructions Each Dose to Equal   Morning Noon Evening Bedtime    albuterol 90 mcg/actuation inhaler   Commonly known as:  PROVENTIL HFA, VENTOLIN HFA, PROAIR HFA       Your last dose was:         Your next dose is:              Take 2 Puffs by inhalation every four (4) hours as needed for Wheezing.    2 Puff                        amLODIPine 10 mg tablet   Commonly known as:  NORVASC       Your last dose was:         Your next dose is:              TAKE ONE TABLET BY MOUTH ONCE DAILY                          Blood-Glucose Meter monitoring kit       Your last dose was:         Your next dose is:              Check sugars 2 times per day, fasting and 2 hours after dinner                         cloNIDine HCl 0.3 mg tablet   Commonly known as:  CATAPRES       Your last dose was:         Your next dose is:              TAKE ONE TABLET BY MOUTH TWICE DAILY                         gabapentin 600 mg tablet   Commonly known as:  NEURONTIN       Your last dose was:         Your next dose is:              TAKE 1 TABLET BY MOUTH THREE TIMES DAILY  glucose blood VI test strips strip   Commonly known as:  ASCENSIA AUTODISC VI, ONE TOUCH ULTRA TEST VI       Your last dose was:         Your next dose is:              Check sugars twice daily                         Lancets Misc       Your last dose was:         Your next dose is:              Check sugars twice daily                         losartan 100 mg tablet   Commonly known as:  COZAAR       Your last dose was:         Your next dose is:              Take 1 Tab by mouth daily. Indications: hypertension    100 mg                        magnesium oxide 400 mg tablet   Commonly known as:  MAG-OX       Your last dose was:         Your next dose is:              TAKE 1 TABLET BY MOUTH DAILY                         metFORMIN ER 750 mg tablet   Commonly known as:  GLUCOPHAGE XR       Your last dose was:         Your next dose is:              TAKE 2 TABLETS EVERY DAY                         ondansetron 8 mg disintegrating tablet   Commonly known as:  ZOFRAN ODT       Your last dose was:         Your next dose is:              Take 1 Tab by mouth every eight (8) hours as needed for Nausea.    8 mg                        oxyCODONE-acetaminophen 10-325 mg per tablet   Commonly known as:  PERCOCET       Your last dose was:         Your next dose is:              Take 1 Tab by mouth every six (6) hours as needed for Pain. Max Daily  Amount: 4 Tabs. Takes for chronic hip pain    1 Tab                        polyethylene glycol 17 gram packet   Commonly known as:  MIRALAX       Your last dose was:  Your next dose is:              Take 1 Packet by mouth daily.    17 g                        QUEtiapine SR 150 mg sr tablet   Commonly known as:  SEROquel XR       Your last dose was:         Your next dose is:              TAKE 1 TABLET BY MOUTH NIGHTLY                         rosuvastatin 5 mg tablet   Commonly known as:  CRESTOR       Your last dose was:         Your next dose is:              Take 1 Tab by mouth nightly.    5 mg                        spironolactone 100 mg tablet   Commonly known as:  ALDACTONE       Your last dose was:         Your next dose is:              TAKE 1 TABLET BY MOUTH DAILY                         traZODone 100 mg tablet   Commonly known as:  DESYREL       Your last dose was:         Your next dose is:              TAKE 1 TABLET BY MOUTH NIGHTLY                              Where to Get Your Medications      These medications were sent to [facility] [place]  [address] [place] [address]     Phone:  [phone]    ??? dicyclomine 20 mg tablet   ??? promethazine 25 mg tablet               The patient was personally evaluated by myself and LEWIS Dutch Quint, MD who agrees with the above assessment and plan.      Dragon medical dictation software was used for portions of this report. Unintended errors may occur.     Cathe Mons, PA-C  September 01, 2016    My signature above authenticates this document and my orders, the final ??  diagnosis (es), discharge prescription (s), and instructions in the Epic ??  record.  If you have any questions please contact [phone].  ??  Nursing notes have been reviewed by the physician/ advanced practice ??  Clinician.

## 2016-09-01 NOTE — ED Notes (Signed)
Attempt to d/c pt, pt does not want to leave d/t continued abd pain, wishes to speak with MD, DR Henrene HawkingSiegel notified

## 2016-09-01 NOTE — ED Notes (Signed)
Pt presents to ED abd pain in RLQ, non - radiating with sudden onset in the middle of the night, early morning.  Pt is actively vomiting clear emesis.  Pt has h/o same.

## 2016-09-01 NOTE — Progress Notes (Signed)
EMR Review       [facility] 08[facility]-16 with complaint of   Nausea, vomiting, diarrhea and abdominal pain.   Patient DC to home. No new discharge orders or prescriptions noted.       Patient attended PCP appointment 08-29-16.       Patient to ED on 09/01/16 with complaint of abdominal pain.  Patient dc to home.   Patient to start Bentyl and Promethazine.   Patient to follow up with PCP and GI.     Nurse Navigator to continue to follow case.     Chart CC to Dr. Jac Canavanharakan for review.

## 2016-09-01 NOTE — ED Notes (Signed)
12:25 PM  09/01/16     Discharge instructions given to patient (name) with verbalization of understanding. Patient accompanied by spouse.  Patient discharged with the following prescriptions   Current Discharge Medication List      START taking these medications    Details   dicyclomine (BENTYL) 20 mg tablet Take 1 Tab by mouth every six (6) hours for 20 doses.  Qty: 20 Tab, Refills: 0      !! promethazine (PHENERGAN) 25 mg tablet Take 1 Tab by mouth every six (6) hours as needed.  Qty: 12 Tab, Refills: 0       !! - Potential duplicate medications found. Please discuss with provider.      CONTINUE these medications which have NOT CHANGED    Details   !! promethazine (PHENERGAN) 12.5 mg tablet Take 1 Tab by mouth every six (6) hours as needed for Nausea.  Qty: 15 Tab, Refills: 0       !! - Potential duplicate medications found. Please discuss with provider.       . Patient discharged to Home (destination).      Sherie DonHeather Decker, RN

## 2016-09-02 LAB — CULTURE, URINE
CULTURE RESULT: >100000 — AB
Culture result: >100000 — AB

## 2016-09-06 NOTE — Progress Notes (Signed)
[facility] to ED[facility]    Admission Date:  09-01-16  Discharge Date:   09-01-16    Disposition:  Home     Per ED Progress note of 09-01-16 in Italics:     Diagnoses   Chronic abdominal pain    Non-intractable vomiting with nausea, unspecified vomiting type          Nurse Navigator called to follow up with patient.   Patient's spouse answered the phone and related that patient is asleep at this time.     Spouse related that patient is "doing better" and that patient has obtained medications that were prescribed.     Spouse stated that patient saw Dr. Jac Canavanharakan on Monday   (08/29/16).     Nurse Navigator will continue to follow case.

## 2016-09-07 ENCOUNTER — Encounter: Attending: Family Medicine | Primary: Physician Assistant

## 2016-09-21 NOTE — Progress Notes (Signed)
Chart reviewed, patient was not re-admitted within 30 days of 08/17/16  Hospital Discharge.     Patient has been admitted to the ED and discharged to home on 08-27-16 and 09-01-16.     Nurse Navigator spoke with patient via telephone call.     Patient stated, " I am good"   Patient states that she had a hip replacement in the past and occasionally that hip bothers her otherwise she has no complaints at the current time.     Patient states that her husband gives her the medications that she is supposed to have daily.  Patient states that she has all medications that are needed.     Nurse Navigator offered to schedule follow up appointment with Dr. Jac Canavan. Patient states that she saw Dr. Jac Canavan and does not feel she needs to see him again at this time.     Nurse Navigator will continue to follow.

## 2016-09-22 ENCOUNTER — Encounter

## 2016-09-22 MED ORDER — MAGNESIUM OXIDE 400 MG TAB
400 mg | ORAL_TABLET | ORAL | 0 refills | Status: DC
Start: 2016-09-22 — End: 2017-01-17

## 2016-10-19 ENCOUNTER — Encounter

## 2016-10-20 MED ORDER — METFORMIN SR 750 MG 24 HR TABLET
750 mg | ORAL_TABLET | ORAL | 1 refills | Status: DC
Start: 2016-10-20 — End: 2017-01-26

## 2016-10-27 ENCOUNTER — Encounter

## 2016-10-31 NOTE — Progress Notes (Signed)
Per EMR Review:     Patent  has not been hospitalized or admitted to the ED since ED Discharge of  09-01-16.     Goals completed and resolved.     Complex Case Management Episode Closed.

## 2016-11-03 ENCOUNTER — Inpatient Hospital Stay: Admit: 2016-11-03 | Payer: MEDICARE | Attending: Specialist | Primary: Physician Assistant

## 2016-11-03 ENCOUNTER — Encounter

## 2016-11-03 DIAGNOSIS — R911 Solitary pulmonary nodule: Secondary | ICD-10-CM

## 2016-11-03 MED ORDER — SPIRONOLACTONE 100 MG TAB
100 mg | ORAL_TABLET | ORAL | 0 refills | Status: DC
Start: 2016-11-03 — End: 2017-05-12

## 2016-11-08 ENCOUNTER — Encounter

## 2016-11-08 MED ORDER — TRAZODONE 100 MG TAB
100 mg | ORAL_TABLET | ORAL | 0 refills | Status: DC
Start: 2016-11-08 — End: 2017-01-25

## 2016-12-06 ENCOUNTER — Encounter: Attending: Family Medicine | Primary: Physician Assistant

## 2016-12-07 ENCOUNTER — Ambulatory Visit: Admit: 2016-12-07 | Discharge: 2016-12-07 | Payer: MEDICARE | Attending: Family Medicine | Primary: Physician Assistant

## 2016-12-07 DIAGNOSIS — Z0000 Encounter for general adult medical examination without abnormal findings: Secondary | ICD-10-CM

## 2016-12-07 LAB — AMB POC HEMOGLOBIN A1C: Hemoglobin A1c (POC): 6.8 %

## 2016-12-07 MED ORDER — OXYCODONE-ACETAMINOPHEN 10 MG-325 MG TAB
10-325 mg | ORAL_TABLET | Freq: Four times a day (QID) | ORAL | 0 refills | Status: DC | PRN
Start: 2016-12-07 — End: 2017-01-09

## 2016-12-07 NOTE — Progress Notes (Signed)
[name] [name] is a 66 y.o. female here for MWV. She has R leg pain after falling recently.    Fall Risk Assessment, last 12 mths 12/07/2016   Able to walk? Yes   Fall in past 12 months? Yes   Fall with injury? Yes   Number of falls in past 12 months 1   Fall Risk Score 2     Abuse Screening Questionnaire 12/07/2016   Do you ever feel afraid of your partner? N   Are you in a relationship with someone who physically or mentally threatens you? N   Is it safe for you to go home? Y     ADL Assessment 12/07/2016   Feeding yourself No Help Needed   Getting from bed to chair No Help Needed   Getting dressed No Help Needed   Bathing or showering Help Needed   Walk across the room (includes cane/walker) No Help Needed   Using the telphone No Help Needed   Taking your medications Help Needed   Preparing meals Help Needed   Managing money (expenses/bills) Help Needed   Moderately strenuous housework (laundry) Help Needed   Shopping for personal items (toiletries/medicines) Help Needed   Shopping for groceries Help Needed   Driving Help Needed   Climbing a flight of stairs Help Needed   Getting to places beyond walking distances Help Needed   **Pt husband helps her with everything

## 2016-12-07 NOTE — Progress Notes (Signed)
This is the Subsequent Medicare Annual Wellness Exam, performed 12 months or more after the Initial AWV or the last Subsequent AWV    I have reviewed the patient's medical history in detail and updated the computerized patient record.     History     Past Medical History:   Diagnosis Date   ??? Arthritis    ??? Chronic pain    ??? Diabetes ([place])    ??? GERD (gastroesophageal reflux disease)    ??? Hypertension    ??? Liver disease     HEP C   ??? PTSD (post-traumatic stress disorder)     lived through the [place] in 1993      Past Surgical History:   Procedure Laterality Date   ??? HX ENDOSCOPY     ??? HX GYN  2010    hysterectomy   ??? HX HIP REPLACEMENT Right 01/29/2015   ??? HX HIP REPLACEMENT       Current Outpatient Prescriptions   Medication Sig Dispense Refill   ??? traZODone (DESYREL) 100 mg tablet TAKE 1 TABLET BY MOUTH NIGHTLY 90 Tab 0   ??? spironolactone (ALDACTONE) 100 mg tablet TAKE 1 TABLET BY MOUTH DAILY 90 Tab 0   ??? metFORMIN ER (GLUCOPHAGE XR) 750 mg tablet TAKE 2 TABLETS EVERY DAY 180 Tab 1   ??? magnesium oxide (MAG-OX) 400 mg tablet TAKE 1 TABLET BY MOUTH DAILY 90 Tab 0   ??? promethazine (PHENERGAN) 25 mg tablet Take 1 Tab by mouth every six (6) hours as needed. 12 Tab 0   ??? ondansetron (ZOFRAN ODT) 8 mg disintegrating tablet Take 1 Tab by mouth every eight (8) hours as needed for Nausea. 30 Tab 1   ??? QUEtiapine SR (SEROQUEL XR) 150 mg sr tablet TAKE 1 TABLET BY MOUTH NIGHTLY 90 Tab 1   ??? polyethylene glycol (MIRALAX) 17 gram packet Take 1 Packet by mouth daily. 1 Each 1   ??? promethazine (PHENERGAN) 12.5 mg tablet Take 1 Tab by mouth every six (6) hours as needed for Nausea. 15 Tab 0   ??? losartan (COZAAR) 100 mg tablet Take 1 Tab by mouth daily. Indications: hypertension 90 Tab 2   ??? gabapentin (NEURONTIN) 600 mg tablet TAKE 1 TABLET BY MOUTH THREE TIMES DAILY 270 Tab 2   ??? albuterol (PROVENTIL HFA, VENTOLIN HFA, PROAIR HFA) 90 mcg/actuation  inhaler Take 2 Puffs by inhalation every four (4) hours as needed for Wheezing. 1 Inhaler 2   ??? amLODIPine (NORVASC) 10 mg tablet TAKE ONE TABLET BY MOUTH ONCE DAILY 90 Tab 1   ??? rosuvastatin (CRESTOR) 5 mg tablet Take 1 Tab by mouth nightly. 90 Tab 1   ??? cloNIDine HCl (CATAPRES) 0.3 mg tablet TAKE ONE TABLET BY MOUTH TWICE DAILY 180 Tab 1   ??? Blood-Glucose Meter monitoring kit Check sugars 2 times per day, fasting and 2 hours after dinner 1 Kit 0   ??? Lancets misc Check sugars twice daily 200 Each 2   ??? glucose blood VI test strips (ASCENSIA AUTODISC VI, ONE TOUCH ULTRA TEST VI) strip Check sugars twice daily 100 Strip 5     Allergies   Allergen Reactions   ??? Chocolate [Cocoa] Sneezing     Family History   Problem Relation Age of Onset   ??? Hypertension Mother    ??? Cancer Maternal Aunt    ??? Alcohol abuse Maternal Grandfather      Social History   Substance Use Topics   ???  Smoking status: Never Smoker   ??? Smokeless tobacco: Never Used   ??? Alcohol use No     Patient Active Problem List   Diagnosis Code   ??? Severe hypertension I10   ??? 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 PTSD (post-traumatic stress disorder) F43.10   ??? Severe headache R51   ??? Accelerated hypertension I10   ??? Acute colitis K52.9   ??? Acute hyperglycemia R73.9   ??? Type 2 diabetes mellitus with hyperglycemia, without long-term current use of insulin (HCC) E11.65   ??? 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 Lumbar and sacral osteoarthritis M47.817   ??? Chronic pain syndrome G89.4   ??? Essential hypertension I10   ??? Sacroiliitis (HCC) M46.1   ??? 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 Lactic acidosis E87.2   ??? Leukocytosis D72.829   ??? Sepsis (HCC) A41.9   ??? Asthma exacerbation J45.901   ??? Asthma with acute exacerbation J45.901   ??? Gastritis K29.70   ??? Nausea & vomiting R11.2   ??? 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 Cannabinoid hyperemesis syndrome (HCC) F12.988    ??? Type 2 diabetes mellitus with diabetic neuropathy (HCC) E11.40       Depression Risk Factor Screening:     PHQ over the last two weeks 12/02/2015   Little interest or pleasure in doing things Not at all   Feeling down, depressed or hopeless Not at all   Total Score PHQ 2 0     Alcohol Risk Factor Screening:   You do not drink alcohol or very rarely.    Functional Ability and Level of Safety:   Hearing Loss  Hearing is good.    Activities of Daily Living  The home contains: no safety equipment.  Patient does total self care    Fall Risk  Fall Risk Assessment, last 12 mths 12/07/2016   Able to walk? Yes   Fall in past 12 months? Yes   Fall with injury? Yes   Number of falls in past 12 months 1   Fall Risk Score 2       Abuse Screen  Patient is not abused    Cognitive Screening   Evaluation of Cognitive Function:  Has your family/caregiver stated any concerns about your memory: no  Normal    Patient Care Team   Patient Care Team:  Lake Bells, MD as PCP - General (Family Practice)  Glendale Chard, MD (Orthopedic Surgery)  Jamelle Haring, MD as Physician (Gastroenterology)  Raynelle Jan, MD as Physician (Internal Medicine)  Kandra Nicolas, MD as Physician (Pain Management)  Georgia Duff, RN as Ambulatory Care Navigator    Assessment/Plan   Education and counseling provided:  Are appropriate based on today's review and evaluation  End-of-Life planning (with patient's consent)    Diagnoses and all orders for this visit:    1. Medicare annual wellness visit, subsequent    2. Controlled type 2 diabetes mellitus without complication, without long-term current use of insulin (HCC)  -     AMB POC GLYCOSYLATED HEMOGLOBIN ASSAY  -     AMB POC URINE, MICROALBUMIN, SEMIQUANTITATIVE    3. Screening for alcoholism  -     Annual  Alcohol Screen 15 min ([id number])    4. Screening for depression  -     Depression Screen Annual    5. Chronic right hip pain  -  oxyCODONE-acetaminophen (PERCOCET 10) 10-325 mg per tablet; Take 1  Tab by mouth every six (6) hours as needed for Pain. Max Daily Amount: 4 Tabs.        Health Maintenance Due   Topic Date Due   ??? FOOT EXAM Q1  06/20/1962   ??? EYE EXAM RETINAL OR DILATED Q1  06/20/1962   ??? DTaP/Tdap/Td series (1 - Tdap) 06/20/1973   ??? ZOSTER VACCINE AGE 36>  04/20/2012   ??? Pneumococcal 19-64 Highest Risk (2 of 3 - PCV13) 12/01/2016   ??? MICROALBUMIN Q1  12/01/2016   ??? MEDICARE YEARLY EXAM  12/02/2016

## 2016-12-07 NOTE — Patient Instructions (Signed)
Medicare Wellness Visit, Female    The best way to live healthy is to have a lifestyle where you eat a well-balanced diet, exercise regularly, limit alcohol use, and quit all forms of tobacco/nicotine, if applicable.     Regular preventive services are another way to keep healthy. Preventive services (vaccines, screening tests, monitoring & exams) can help personalize your care plan, which helps you manage your own care. Screening tests can find health problems at the earliest stages, when they are easiest to treat.     [facility] follows the current, evidence-based guidelines published by the [place] [employer] (USPSTF) when recommending preventive services for our patients. Because we follow these guidelines, sometimes recommendations change over time as research supports it. (For example, mammograms used to be recommended annually. Even though Medicare will still pay for an annual mammogram, the newer guidelines recommend a mammogram every two years for women of average risk.)    Of course, you and your provider may decide to screen more often for some diseases, based on your risk and co-morbidities (chronic disease you are already diagnosed with).     Preventive services for you include:    - Medicare offers their members a free annual wellness visit, which is time for you and your primary care provider to discuss and plan for your preventive service needs. Take advantage of this benefit every year!    -All people over age 85 should receive the recommended pneumonia vaccines. Current USPSTF guidelines recommend a series of two vaccines for the best pneumonia protection.     -All adults should have a yearly flu vaccine and a tetanus vaccine every 10 years. All adults age 4 years should receive a shingles vaccine once in their lifetime.      -A bone mass density test is recommended when a woman turns 65 to screen  for osteoporosis. This test is only recommended once as a screening. Some providers will use this same test as a disease monitoring tool if you already have osteoporosis.    -All adults age 82-70 years who are overweight should have a diabetes screening test once every three years.     -Other screening tests & preventive services for persons with diabetes include: an eye exam to screen for diabetic retinopathy, a kidney function test, a foot exam, and stricter control over your cholesterol.     -Cardiovascular screening for adults with routine risk involves an electrocardiogram (ECG) at intervals determined by the provider.     -Colorectal cancer screenings should be done for adults age 82-75 years with normal risk. There are a number of acceptable methods of screening for this type of cancer. Each test has its own benefits and drawbacks. Discuss with your provider what is most appropriate for you during your annual wellness visit. The different tests include: colonoscopy (considered the best screening method), a fecal occult blood test, a fecal DNA test, and sigmoidoscopy.    -Breast cancer screenings are recommended every other year for women of normal risk age 23-74 years.     -Cervical cancer screenings for women over age 29 are only recommended with certain risk factors.     -All adults born between [place] should be screened once for Hepatitis C.     Here is a list of your current Health Maintenance items (your personalized list of preventive services) with a due date:  Health Maintenance Due   Topic Date Due   ??? Diabetic Foot Care  06/20/1962   ??? Eye Exam  06/20/1962   ??? DTaP/Tdap/Td  (1 - Tdap) 06/20/1973   ??? Shingles Vaccine  04/20/2012   ??? Pneumococcal Vaccine (2 of 3 - PCV13) 12/01/2016   ??? Albumin Urine Test  12/01/2016   ??? Annual Well Visit  12/02/2016

## 2016-12-07 NOTE — Progress Notes (Signed)
Annual Wellness Visit        HPI: [name] [name] is a 66 y.o. female [place]  Here for annual wellness visit. Reports right hip pain after fall 2 nights ago. Tripped and fell onto right hip. She has hx of right hip pain and this exacerbated the problem. She was seeing Dr Leron Croak for pain management and was previously on Percocet. However was discharged from practice due to marijuana use. Reports this was a one time only event as her girlfriend was in town and they had a "wild" night.        Past Medical History:   Diagnosis Date   ??? Arthritis    ??? Chronic pain    ??? Diabetes ([place])    ??? GERD (gastroesophageal reflux disease)    ??? Hypertension    ??? Liver disease     HEP C   ??? PTSD (post-traumatic stress disorder)     lived through the [place] in [place] Prescriptions   Medication Sig   ??? oxyCODONE-acetaminophen (PERCOCET 10) 10-325 mg per tablet Take 1 Tab by mouth every six (6) hours as needed for Pain. Max Daily Amount: 4 Tabs.   ??? traZODone (DESYREL) 100 mg tablet TAKE 1 TABLET BY MOUTH NIGHTLY   ??? spironolactone (ALDACTONE) 100 mg tablet TAKE 1 TABLET BY MOUTH DAILY   ??? metFORMIN ER (GLUCOPHAGE XR) 750 mg tablet TAKE 2 TABLETS EVERY DAY   ??? magnesium oxide (MAG-OX) 400 mg tablet TAKE 1 TABLET BY MOUTH DAILY   ??? promethazine (PHENERGAN) 25 mg tablet Take 1 Tab by mouth every six (6) hours as needed.   ??? ondansetron (ZOFRAN ODT) 8 mg disintegrating tablet Take 1 Tab by mouth every eight (8) hours as needed for Nausea.   ??? QUEtiapine SR (SEROQUEL XR) 150 mg sr tablet TAKE 1 TABLET BY MOUTH NIGHTLY   ??? polyethylene glycol (MIRALAX) 17 gram packet Take 1 Packet by mouth daily.   ??? promethazine (PHENERGAN) 12.5 mg tablet Take 1 Tab by mouth every six (6) hours as needed for Nausea.   ??? losartan (COZAAR) 100 mg tablet Take 1 Tab by mouth daily. Indications: hypertension   ??? gabapentin (NEURONTIN) 600 mg tablet TAKE 1 TABLET BY MOUTH THREE TIMES DAILY    ??? albuterol (PROVENTIL HFA, VENTOLIN HFA, PROAIR HFA) 90 mcg/actuation inhaler Take 2 Puffs by inhalation every four (4) hours as needed for Wheezing.   ??? amLODIPine (NORVASC) 10 mg tablet TAKE ONE TABLET BY MOUTH ONCE DAILY   ??? rosuvastatin (CRESTOR) 5 mg tablet Take 1 Tab by mouth nightly.   ??? cloNIDine HCl (CATAPRES) 0.3 mg tablet TAKE ONE TABLET BY MOUTH TWICE DAILY   ??? Blood-Glucose Meter monitoring kit Check sugars 2 times per day, fasting and 2 hours after dinner   ??? Lancets misc Check sugars twice daily   ??? glucose blood VI test strips (ASCENSIA AUTODISC VI, ONE TOUCH ULTRA TEST VI) strip Check sugars twice daily     No current facility-administered medications for this visit.        Allergies   Allergen Reactions   ??? Chocolate [Cocoa] Sneezing       Past Surgical History:   Procedure Laterality Date   ??? HX ENDOSCOPY     ??? HX GYN  2010    hysterectomy   ??? HX HIP REPLACEMENT Right 01/29/2015   ??? HX HIP REPLACEMENT  Family History   Problem Relation Age of Onset   ??? Hypertension Mother    ??? Cancer Maternal Aunt    ??? Alcohol abuse Maternal Grandfather        Social History     Social History   ??? Marital status: MARRIED     Spouse name: N/A   ??? Number of children: N/A   ??? Years of education: N/A     Occupational History   ??? Not on file.     Social History Main Topics   ??? Smoking status: Never Smoker   ??? Smokeless tobacco: Never Used   ??? Alcohol use No   ??? Drug use: No   ??? Sexual activity: No     Other Topics Concern   ??? Not on file     Social History Narrative       Gen- No weight loss, No Malaise, No fatigue  Eyes- No dipoplia, blurry vision  CVS- No Chest pain, no palpitations, no edema  Resp- No Cough, SOB, DOE, Wheezing  Neuro- No headaches  Skin- No easy bruising, No rashes      Visit Vitals   ??? BP 114/84 (BP 1 Location: Right arm, BP Patient Position: Sitting)   ??? Pulse 85   ??? Temp 98.3 ??F (36.8 ??C) (Oral)   ??? Resp 18   ??? Ht 5' 7"  (1.702 m)   ??? Wt 189 lb (85.7 kg)   ??? SpO2 96%   ??? BMI 29.6 kg/m2        GEN- NAD, AAOx3  CVS- RRR, +S1, +S2, No Murmurs, No JVD  PULM- CTABL, No W/R/R  EXT- No edema  NEURO-CN2-12 grossly intact  MSK- Right hip- no deformity, no TTP, normal range, pain with flexion and internal rotation  PSYCH- Euthymic, normal afffect    Results for orders placed or performed in visit on 12/07/16   AMB POC HEMOGLOBIN A1C   Result Value Ref Range    Hemoglobin A1c (POC) 6.8 %   AMB POC URINE, MICROALBUMIN, SEMIQUANTITATIVE   Result Value Ref Range    Microalbumin urine (POC) 80 MG/L    Microalbumin/creat ratio (POC) 30-300 <30 MG/G         Assesment:    ICD-10-CM ICD-9-CM    1. Medicare annual wellness visit, subsequent Z00.00 V70.0    2. Controlled type 2 diabetes mellitus without complication, without long-term current use of insulin (HCC) E11.9 250.00 AMB POC HEMOGLOBIN A1C      AMB POC URINE, MICROALBUMIN, SEMIQUANTITATIVE      CANCELED: AMB POC CREATININE ASSAY, OTHER SOURCE   3. Screening for alcoholism Z13.89 V79.1 PR ANNUAL ALCOHOL SCREEN 15 MIN   4. Screening for depression Z13.89 V79.0 DEPRESSION SCREEN ANNUAL   5. Chronic right hip pain M25.551 719.45 oxyCODONE-acetaminophen (PERCOCET 10) 10-325 mg per tablet    G89.29 338.29 REFERRAL TO PAIN MANAGEMENT   6. Essential hypertension I10 401.9      Diabetes well controlled. Continue current regimen.    Referral to new pain management doctor. Refilled percocet short course    Blood pressure well controlled. Continue current regimen    I have discussed the diagnosis with the patient and the intended management  The patient has received an after-visit summary and questions were answered concerning future plans.  I have discussed medication usage, side effects and warnings with the patient as well.I have reviewed the plan of care with the patient, accepted their input and they are in agreement with the treatment  goals.         Follow-up Disposition:  Return in about 6 months (around 06/08/2017).      Lake Bells, MD                .

## 2016-12-08 LAB — AMB POC URINE, MICROALBUMIN, SEMIQUANTITATIVE: Microalbumin urine (POC): 80 MG/L

## 2016-12-11 ENCOUNTER — Encounter

## 2016-12-12 MED ORDER — ROSUVASTATIN 5 MG TAB
5 mg | ORAL_TABLET | ORAL | 2 refills | Status: DC
Start: 2016-12-12 — End: 2017-07-13

## 2016-12-16 ENCOUNTER — Inpatient Hospital Stay
Admit: 2016-12-16 | Discharge: 2016-12-16 | Disposition: A | Discharge status: Home / Self Care | Payer: MEDICARE | Attending: Emergency Medicine

## 2016-12-16 DIAGNOSIS — L02215 Cutaneous abscess of perineum: Secondary | ICD-10-CM

## 2016-12-16 MED ORDER — CLINDAMYCIN 300 MG CAP
300 mg | ORAL_CAPSULE | Freq: Three times a day (TID) | ORAL | 0 refills | Status: AC
Start: 2016-12-16 — End: 2016-12-21

## 2016-12-16 NOTE — ED Triage Notes (Signed)
Pt has abscess on vagina getting more painful last few days, has also been having productive cough with some shortness of breath

## 2016-12-16 NOTE — ED Provider Notes (Signed)
[facility]  Emergency Department Treatment Report        Patient: [name] [name] Age: 66 y.o. Sex: female    Date of Birth: 17-Nov-1950 Admit Date: 12/16/2016 PCP: Lake Bells, MD   MRN: [id number]  CSN: [id number]  Attending: Mora Bellman, MD   Room: ER08/ER08 Time Dictated: 11:29 AM [id number]       Chief Complaint   boil    History of Present Illness   66 y.o. female complaining of a" boil" lower abdomen which she noticed 2-3 days ago, using warm compresses with no improvement.  Pain is throbbing, constant and nonradiating, not associated with any fever or vomiting.    Review of Systems   Review of Systems   Constitutional: Negative for chills and fever.   Gastrointestinal: Positive for abdominal pain. Negative for vomiting.   Musculoskeletal: Negative for myalgias.   Skin:        Redness and swelling lower abdomen   Neurological: Negative for dizziness.       Past Medical/Surgical History     Past Medical History:   Diagnosis Date   ??? Arthritis    ??? Chronic pain    ??? Diabetes ([place])    ??? GERD (gastroesophageal reflux disease)    ??? Hypertension    ??? Liver disease     HEP C   ??? PTSD (post-traumatic stress disorder)     lived through the [place] in 1993     Past Surgical History:   Procedure Laterality Date   ??? HX ENDOSCOPY     ??? HX GYN  2010    hysterectomy   ??? HX HIP REPLACEMENT Right 01/29/2015   ??? HX HIP REPLACEMENT     Denies history of recurrent abscesses    Social History     Social History     Social History   ??? Marital status: MARRIED     Spouse name: N/A   ??? Number of children: N/A   ??? Years of education: N/A     Occupational History   ??? Not on file.     Social History Main Topics   ??? Smoking status: Never Smoker   ??? Smokeless tobacco: Never Used   ??? Alcohol use No   ??? Drug use: No   ??? Sexual activity: No     Other Topics Concern   ??? Not on file     Social History Narrative       Family History     Family History   Problem Relation Age of Onset    ??? Hypertension Mother    ??? Cancer Maternal Aunt    ??? Alcohol abuse Maternal Grandfather        Current Medications     Prior to Admission Medications   Prescriptions Last Dose Informant Patient Reported? Taking?   Blood-Glucose Meter monitoring kit   No No   Sig: Check sugars 2 times per day, fasting and 2 hours after dinner   Lancets misc   No No   Sig: Check sugars twice daily   QUEtiapine SR (SEROQUEL XR) 150 mg sr tablet   No No   Sig: TAKE 1 TABLET BY MOUTH NIGHTLY   albuterol (PROVENTIL HFA, VENTOLIN HFA, PROAIR HFA) 90 mcg/actuation inhaler   No No   Sig: Take 2 Puffs by inhalation every four (4) hours as needed for Wheezing.   amLODIPine (NORVASC) 10 mg tablet   No No   Sig:  TAKE ONE TABLET BY MOUTH ONCE DAILY   cloNIDine HCl (CATAPRES) 0.3 mg tablet   No No   Sig: TAKE ONE TABLET BY MOUTH TWICE DAILY   gabapentin (NEURONTIN) 600 mg tablet   No No   Sig: TAKE 1 TABLET BY MOUTH THREE TIMES DAILY   glucose blood VI test strips (ASCENSIA AUTODISC VI, ONE TOUCH ULTRA TEST VI) strip   No No   Sig: Check sugars twice daily   losartan (COZAAR) 100 mg tablet   No No   Sig: Take 1 Tab by mouth daily. Indications: hypertension   magnesium oxide (MAG-OX) 400 mg tablet   No No   Sig: TAKE 1 TABLET BY MOUTH DAILY   metFORMIN ER (GLUCOPHAGE XR) 750 mg tablet   No No   Sig: TAKE 2 TABLETS EVERY DAY   ondansetron (ZOFRAN ODT) 8 mg disintegrating tablet   No No   Sig: Take 1 Tab by mouth every eight (8) hours as needed for Nausea.   oxyCODONE-acetaminophen (PERCOCET 10) 10-325 mg per tablet   No No   Sig: Take 1 Tab by mouth every six (6) hours as needed for Pain. Max Daily Amount: 4 Tabs.   polyethylene glycol (MIRALAX) 17 gram packet   No No   Sig: Take 1 Packet by mouth daily.   promethazine (PHENERGAN) 12.5 mg tablet   No No   Sig: Take 1 Tab by mouth every six (6) hours as needed for Nausea.   promethazine (PHENERGAN) 25 mg tablet   No No   Sig: Take 1 Tab by mouth every six (6) hours as needed.    rosuvastatin (CRESTOR) 5 mg tablet   No No   Sig: TAKE 1 TABLET BY MOUTH NIGHTLY   spironolactone (ALDACTONE) 100 mg tablet   No No   Sig: TAKE 1 TABLET BY MOUTH DAILY   traZODone (DESYREL) 100 mg tablet   No No   Sig: TAKE 1 TABLET BY MOUTH NIGHTLY      Facility-Administered Medications: None       Allergies     Allergies   Allergen Reactions   ??? Chocolate [Cocoa] Sneezing       Physical Exam   ED Triage Vitals   ED Encounter Vitals Group      BP 12/16/16 1024 98/69      Pulse (Heart Rate) 12/16/16 1024 109      Resp Rate 12/16/16 1024 22      Temp 12/16/16 1024 98.1 ??F (36.7 ??C)      Temp src --       O2 Sat (%) 12/16/16 1024 99 %      Weight 12/16/16 1004 195 lb      Height 12/16/16 1004 5' 9"     Physical Exam   Constitutional: She is oriented to person, place, and time.   Constitutional:General appearance:  Patient appears well developed and well nourished.     HENT:   Head: Normocephalic and atraumatic.   Mouth/Throat: Oropharynx is clear and moist.   Eyes: Conjunctivae are normal.   Neck: Neck supple.   Pulmonary/Chest: Effort normal. No respiratory distress.   Musculoskeletal:   No lower extremity edema   Neurological: She is alert and oriented to person, place, and time.   Skin:   Abscess noted upper perineum, about the size of a small grape with fluctuance and some tenderness and induration   Vitals reviewed.       Impression and Management Plan   Patient  with an abscess that will need drainage    ED Course     Medications - No data to display  ED Course   I&D Abcess Complex  Date/Time: 12/16/2016 11:32 AM  Performed by: Benay Pike A  Authorized by: Benay Pike A     Location:     Type:  Abscess    Location: Perineum.  Pre-procedure details:     Skin preparation:  Betadine  Anesthesia (see MAR for exact dosages):     Anesthesia method:  Local infiltration    Local anesthetic:  Lidocaine 2% w/o epi  Procedure details:     Incision types:  Single straight    Scalpel blade:  11     Wound management:  Probed and deloculated    Drainage:  Purulent    Drainage amount:  Moderate    Packing materials:  1/4 in gauze      Medical Decision Making   This is a patient is a diabetic, states will control, compliant with medications and followed regularly by her doctor.  History diabetes and D abscess we will put on a few days of antibiotics    Final Diagnosis       ICD-10-CM ICD-9-CM   1. Perineal abscess L02.215 682.2       Disposition   warm compresses intermittently  packing removal in 2-3 days   Return for increased redness, increased swelling or worsening    Current Discharge Medication List      START taking these medications    Details   clindamycin (CLEOCIN) 300 mg capsule Take 1 Cap by mouth three (3) times daily for 5 days.  Qty: 15 Cap, Refills: 0             The patient was personally evaluated by myself and discussed with Dr. Mora Bellman, MD who agrees with the above assessment and plan.    Benay Pike, PA-C  December 16, 2016    My signature above authenticates this document and my orders, the final ??  diagnosis (es), discharge prescription (s), and instructions in the Epic ??  record.  If you have any questions please contact [phone].  ??  Nursing notes have been reviewed by the physician/ advanced practice ??  Clinician.    Dragon medical dictation software was used for portions of this report. Unintended voice recognition errors may occur.

## 2016-12-16 NOTE — ED Notes (Signed)
I have reviewed discharge instructions with the patient.  The patient verbalized understanding.

## 2017-01-09 ENCOUNTER — Emergency Department: Admit: 2017-01-10 | Payer: MEDICARE | Primary: Physician Assistant

## 2017-01-09 DIAGNOSIS — J441 Chronic obstructive pulmonary disease with (acute) exacerbation: Secondary | ICD-10-CM

## 2017-01-09 NOTE — ED Notes (Signed)
Discharge instructions reviewed with patient. Prescriptions for Tessalon perles and Prednisone was also reviewed with patient. Patient verbalized understanding. Opportunity for questions and clarifications  was provided. Patient discharged to home. Patient accompanied by family member.

## 2017-01-09 NOTE — ED Notes (Signed)
Patient reports chronic pain to L groin and wanted to know if she can get something for pain. Husband at bedside reports that MD trying to get her on pain management. Patient informed that RN will address to PA-C.

## 2017-01-09 NOTE — ED Notes (Signed)
Pt to er with asthma attack wheeze thought out tripoding in triage given a neb treatment times one in triage till able to send to main treatment area for treatment pt did well resp more calm pt more calm after one neb albuterl treatment

## 2017-01-09 NOTE — ED Triage Notes (Signed)
Asthma and ran out of meds

## 2017-01-09 NOTE — ED Notes (Signed)
Patient complaining of sob and wheezing since yesterday. Patient states she has her inhaler at home and has not been helping.   Patient also reports productive cough with whitish sputum

## 2017-01-09 NOTE — ED Provider Notes (Signed)
[facility] [facility]  Emergency Department Treatment Report    Patient: [name] [name] Age: 66 y.o. Sex: female    Date of Birth: 12-06-1950 Admit Date: 01/09/2017 PCP: Lake Bells, MD   MRN: [id number]  CSN: [id number]  Attending: Oneita Hurt, MD   Room: ER04/ER04 Time Dictated: 11:03 PM APP: Antoine Primas, PA-C     Chief Complaint   Chief Complaint   Patient presents with   ??? Wheezing     asthma states pump not working treatment in traige       History of Present Illness   67 y.o. female complains of "raspy" cough with chest tightness, shortness breath, and wheezing x 3???4 days.  Symptoms worsen since last night.  Now getting no relief with her albuterol inhaler.  History of COPD and this feels similar to prior flareups.    Review of Systems   Constitutional: No fever, chills, malaise  ENT: No sore throat, runny nose or other URI symptoms  RESP: See above. No DOE, orthopnea.   CV: See above. No palpitations.   GI: No nausea, vomiting, diarrhea, abdominal pain  MSK: No leg swelling    Past Medical/Surgical History     Past Medical History:   Diagnosis Date   ??? Arthritis    ??? Chronic pain    ??? Diabetes ([place])    ??? Emphysema lung ([place])    ??? GERD (gastroesophageal reflux disease)    ??? Hypertension    ??? Liver disease     HEP C   ??? PTSD (post-traumatic stress disorder)     lived through the [place] in 1993     Past Surgical History:   Procedure Laterality Date   ??? HX ENDOSCOPY     ??? HX GYN  2010    hysterectomy   ??? HX HIP REPLACEMENT Right 01/29/2015   ??? HX HIP REPLACEMENT         Social History     Social History     Social History   ??? Marital status: MARRIED     Spouse name: N/A   ??? Number of children: N/A   ??? Years of education: N/A     Social History Main Topics   ??? Smoking status: Never Smoker   ??? Smokeless tobacco: Never Used   ??? Alcohol use No   ??? Drug use: Yes     Special: Marijuana   ??? Sexual activity: No     Other Topics Concern   ??? None     Social History Narrative        Family History     Family History   Problem Relation Age of Onset   ??? Hypertension Mother    ??? Cancer Maternal Aunt    ??? Alcohol abuse Maternal Grandfather        Current Medications     Prior to Admission Medications   Prescriptions Last Dose Informant Patient Reported? Taking?   Blood-Glucose Meter monitoring kit   No Yes   Sig: Check sugars 2 times per day, fasting and 2 hours after dinner   Lancets misc   No Yes   Sig: Check sugars twice daily   QUEtiapine SR (SEROQUEL XR) 150 mg sr tablet   No Yes   Sig: TAKE 1 TABLET BY MOUTH NIGHTLY   albuterol (PROVENTIL HFA, VENTOLIN HFA, PROAIR HFA) 90 mcg/actuation inhaler   No Yes   Sig: Take 2 Puffs by inhalation every four (4) hours  as needed for Wheezing.   amLODIPine (NORVASC) 10 mg tablet   No Yes   Sig: TAKE ONE TABLET BY MOUTH ONCE DAILY   gabapentin (NEURONTIN) 600 mg tablet   No Yes   Sig: TAKE 1 TABLET BY MOUTH THREE TIMES DAILY   glucose blood VI test strips (ASCENSIA AUTODISC VI, ONE TOUCH ULTRA TEST VI) strip   No Yes   Sig: Check sugars twice daily   losartan (COZAAR) 100 mg tablet   No Yes   Sig: Take 1 Tab by mouth daily. Indications: hypertension   magnesium oxide (MAG-OX) 400 mg tablet   No Yes   Sig: TAKE 1 TABLET BY MOUTH DAILY   metFORMIN ER (GLUCOPHAGE XR) 750 mg tablet   No Yes   Sig: TAKE 2 TABLETS EVERY DAY   Patient taking differently: 750 mg. TAKE 2 TABLETS EVERY DAY   rosuvastatin (CRESTOR) 5 mg tablet   No Yes   Sig: TAKE 1 TABLET BY MOUTH NIGHTLY   spironolactone (ALDACTONE) 100 mg tablet   No Yes   Sig: TAKE 1 TABLET BY MOUTH DAILY   traZODone (DESYREL) 100 mg tablet   No Yes   Sig: TAKE 1 TABLET BY MOUTH NIGHTLY      Facility-Administered Medications: None     Allergies     Allergies   Allergen Reactions   ??? Chocolate [Cocoa] Sneezing     Physical Exam   ED Triage Vitals   ED Encounter Vitals Group      BP 01/09/17 2044 156/112      Pulse (Heart Rate) 01/09/17 2044 112      Resp Rate 01/09/17 2044 18       Temp 01/09/17 2044 98.2 ??F (36.8 ??C)      Temp src --       O2 Sat (%) 01/09/17 2044 97 %      Weight 01/09/17 2044 200 lb      Height --      Constitutional: Well developed, well nourished. Seated comfortably on bed.   EYES: Conjuctivae clear, lids normal.  Pupils equal, symmetrical, and normally reactive.  ENT: Oral mucosa is pink, moist and without lesions.   RESP: Clear and equal BS. Faint expiratory wheeze heard at lung bases bilaterally. No rhonchi, crackles, or rales. No respiratory distress, tachypnea, or accessory muscle use.  CV: Sinus tachycardia.  No murmurs.  GI:  Abdomen soft, non-distended, nontender to palpation.   MSK: Moves all 4 extremities spontaneously.   SKIN: Warm and dry.  NEURO: Alert, oriented.    Impression and Management Plan   Well-appearing.  NAD.  She is slightly tachycardic.  Lungs with faint expiratory wheeze.  Consider COPD exacerbation, pneumonia, pneumothorax.  Will obtain chest x-ray, treat symptomatically, and monitor.    Diagnostic Studies   Lab:   Results for orders placed or performed during the hospital encounter of 01/09/17   XR CHEST PA LAT    Narrative    INDICATION: cough, SOB.  Dyspnea  TECHNIQUE: PA/lateral Chest Radiograph.  COMPARISON: Previous chest CT dated 11/03/2016 and previous chest radiograph  dated 08/27/2016      Impression    IMPRESSION:  Heart size upper normal. Aorta is tortuous. Mild vascular congestion. Stable  left lower lung zone pulmonary nodule. Bibasilar linear atelectatic changes. No  acute infiltrate.         ED Course/Medical Decision Making     Medications   predniSONE (DELTASONE) tablet 60 mg (60 mg Oral Given 01/09/17 2141)  albuterol-ipratropium (DUO-NEB) 2.5 MG-0.5 MG/3 ML (3 mL Nebulization Given 01/09/17 2239)   albuterol CONCENTRATE 2.30m/0.5 mL neb soln (1.25 mg Nebulization Given 01/09/17 2219)     She received nebs and oral steroids.  Afterward improved and requesting  discharge.  On reexamination lungs clear and vitals improved.  Patient felt stable for discharge.  Given prednisone and Tessalon Perles prescriptions.  Instructed to continue use of albuterol inhaler as needed.  Instructed to follow-up with PCP in 2???3 days for recheck.  Return to ER immediately if worsens.  She is happy and agreeable plan.    Final Diagnosis       ICD-10-CM ICD-9-CM   1. [place] J44.1 491.21       Disposition   Home in stable condition.      CAntoine Primas PA-C  January 09, 2017    The patient was fully discussed with attending ROneita Hurt MD  who agrees with the above assessment and plan.    Nursing notes have been reviewed by the physician/ advanced practice Clinician.    Dragon medical dictation was used for portions of this report. Unintended grammatical errors may occur.    My signature above authenticates this document and my orders, the final diagnosis (es), discharge prescription (s), and instructions in the Epic record. If you have any questions please contact ([phone]

## 2017-01-09 NOTE — ED Notes (Signed)
RT made aware that PA-C wants another neb treatment for patient.

## 2017-01-10 ENCOUNTER — Inpatient Hospital Stay
Admit: 2017-01-10 | Discharge: 2017-01-10 | Disposition: A | Discharge status: Home / Self Care | Payer: MEDICARE | Attending: Emergency Medicine

## 2017-01-10 MED ORDER — IPRATROPIUM-ALBUTEROL 2.5 MG-0.5 MG/3 ML NEB SOLUTION
2.5 mg-0.5 mg/3 ml | RESPIRATORY_TRACT | Status: AC
Start: 2017-01-10 — End: 2017-01-09
  Administered 2017-01-10: 03:00:00 via RESPIRATORY_TRACT

## 2017-01-10 MED ORDER — ALBUTEROL SULFATE 2.5 MG/0.5 ML NEB SOLUTION
2.5 mg/0.5 mL | RESPIRATORY_TRACT | Status: AC
Start: 2017-01-10 — End: 2017-01-09
  Administered 2017-01-10: 02:00:00 via RESPIRATORY_TRACT

## 2017-01-10 MED ORDER — BENZONATATE 100 MG CAP
100 mg | ORAL_CAPSULE | Freq: Three times a day (TID) | ORAL | 0 refills | Status: DC | PRN
Start: 2017-01-10 — End: 2017-09-15

## 2017-01-10 MED ORDER — PREDNISONE 20 MG TAB
20 mg | ORAL | Status: AC
Start: 2017-01-10 — End: 2017-01-09
  Administered 2017-01-10: 02:00:00 via ORAL

## 2017-01-10 MED ORDER — IPRATROPIUM-ALBUTEROL 2.5 MG-0.5 MG/3 ML NEB SOLUTION
2.5 mg-0.5 mg/3 ml | RESPIRATORY_TRACT | Status: AC
Start: 2017-01-10 — End: 2017-01-09
  Administered 2017-01-10: 01:00:00 via RESPIRATORY_TRACT

## 2017-01-10 MED ORDER — PREDNISONE 20 MG TAB
20 mg | ORAL_TABLET | Freq: Every day | ORAL | 0 refills | Status: AC
Start: 2017-01-10 — End: 2017-01-12

## 2017-01-10 MED FILL — PREDNISONE 20 MG TAB: 20 mg | ORAL | Qty: 3

## 2017-01-10 MED FILL — IPRATROPIUM-ALBUTEROL 2.5 MG-0.5 MG/3 ML NEB SOLUTION: 2.5 mg-0.5 mg/3 ml | RESPIRATORY_TRACT | Qty: 3

## 2017-01-17 ENCOUNTER — Encounter

## 2017-01-18 MED ORDER — MAGNESIUM OXIDE 400 MG TAB
400 mg | ORAL_TABLET | ORAL | 0 refills | Status: DC
Start: 2017-01-18 — End: 2017-04-24

## 2017-01-19 ENCOUNTER — Inpatient Hospital Stay
Admit: 2017-01-19 | Discharge: 2017-01-19 | Disposition: A | Discharge status: Home / Self Care | Payer: MEDICARE | Attending: Emergency Medicine

## 2017-01-19 DIAGNOSIS — R51 Headache: Secondary | ICD-10-CM

## 2017-01-19 LAB — POC URINE MACROSCOPIC
Bilirubin: NEGATIVE
Glucose: NEGATIVE mg/dl
Ketone: NEGATIVE mg/dl
Nitrites: POSITIVE — AB
Protein: NEGATIVE mg/dl
Specific gravity: 1.015 (ref 1.005–1.030)
Urobilinogen: 1 EU/dl (ref 0.0–1.0)
pH (UA): 8.5 (ref 5–9)

## 2017-01-19 LAB — POC URINE MICROSCOPIC

## 2017-01-19 MED ORDER — HYDROXYZINE PAMOATE 25 MG CAP
25 mg | ORAL_CAPSULE | Freq: Every evening | ORAL | 0 refills | Status: AC | PRN
Start: 2017-01-19 — End: 2017-02-02

## 2017-01-19 MED ORDER — CEPHALEXIN 500 MG CAP
500 mg | ORAL_CAPSULE | Freq: Two times a day (BID) | ORAL | 0 refills | Status: DC
Start: 2017-01-19 — End: 2017-01-25

## 2017-01-19 NOTE — ED Triage Notes (Signed)
Pt reports painful and frequent urination for 2 days and unable to sleep for 4 days.

## 2017-01-19 NOTE — ED Provider Notes (Signed)
[facility]  Emergency Department Treatment Report    Patient: [name] [name] Age: 66 y.o. Sex: female    Date of Birth: Oct 29, 1950 Admit Date: 01/19/2017 PCP: Lake Bells, MD   MRN: [id number]  CSN: [id number]  Attending: Terald Sleeper, MD   Room: ER36/ER36 Time Dictated: 12:57 PM APP:  Harless Nakayama, PA-C     Chief Complaint   Chief Complaint   Patient presents with   ??? Urinary Pain   ??? Sleep Problem   ??? Headache         History of Present Illness   66 y.o. female presents to the ED with dysuria, urgency, and frequency as well as insomnia for the past 4 days. Patient has taken Trazodone to help her sleep with no relief. She was given Ambien in the past which she claims resolved her sleeping issue. Patient reports that her issue with insomnia started after the [facility] collapsed in 9/11 as she was in [place]. She states that the sleeping problem went away after awhile and she was fine for years until it resurfaced 4 days ago. She has developed a mild headache today as well. Denies any pain elsewhere and has no other complaints. Denies an suicidal/homicidal ideations.      Review of Systems   Constitutional: No fever, chills. Positive for difficulty sleeping.   Eyes: No visual symptoms.  ENT: No sore throat, runny nose  Respiratory: No cough, dyspnea or wheezing.  Cardiovascular: No chest pain, palpitations, syncope  Gastrointestinal: No abdominal pain, nausea vomiting, diarrhea.  Genitourinary: Positive for dysuria, urgency and frequency. No hematuria.  Musculoskeletal: No lower extremity swelling. No joint pain or injury.   Integumentary: No rashes, abrasions or lacerations.   Neurological: Positive for headache.     Past Medical/Surgical History     Past Medical History:   Diagnosis Date   ??? Arthritis    ??? Chronic pain    ??? Diabetes ([place])    ??? Emphysema lung ([place])    ??? GERD (gastroesophageal reflux disease)    ??? Hypertension    ??? Liver disease     HEP C    ??? PTSD (post-traumatic stress disorder)     lived through the [place] in 1993     Past Surgical History:   Procedure Laterality Date   ??? HX ENDOSCOPY     ??? HX GYN  2010    hysterectomy   ??? HX HIP REPLACEMENT Right 01/29/2015   ??? HX HIP REPLACEMENT       Social History     Social History     Social History   ??? Marital status: MARRIED     Spouse name: N/A   ??? Number of children: N/A   ??? Years of education: N/A     Social History Main Topics   ??? Smoking status: Never Smoker   ??? Smokeless tobacco: Never Used   ??? Alcohol use No   ??? Drug use: Yes     Special: Marijuana   ??? Sexual activity: No     Other Topics Concern   ??? Not on file     Social History Narrative     Family History     Family History   Problem Relation Age of Onset   ??? Hypertension Mother    ??? Cancer Maternal Aunt    ??? Alcohol abuse Maternal Grandfather      Home Medications     Prior to  Admission Medications   Prescriptions Last Dose Informant Patient Reported? Taking?   Blood-Glucose Meter monitoring kit   No No   Sig: Check sugars 2 times per day, fasting and 2 hours after dinner   Lancets misc   No No   Sig: Check sugars twice daily   QUEtiapine SR (SEROQUEL XR) 150 mg sr tablet   No No   Sig: TAKE 1 TABLET BY MOUTH NIGHTLY   albuterol (PROVENTIL HFA, VENTOLIN HFA, PROAIR HFA) 90 mcg/actuation inhaler   No No   Sig: Take 2 Puffs by inhalation every four (4) hours as needed for Wheezing.   amLODIPine (NORVASC) 10 mg tablet   No No   Sig: TAKE ONE TABLET BY MOUTH ONCE DAILY   benzonatate (TESSALON PERLES) 100 mg capsule   No No   Sig: Take 1 Cap by mouth three (3) times daily as needed for Cough.   gabapentin (NEURONTIN) 600 mg tablet   No No   Sig: TAKE 1 TABLET BY MOUTH THREE TIMES DAILY   glucose blood VI test strips (ASCENSIA AUTODISC VI, ONE TOUCH ULTRA TEST VI) strip   No No   Sig: Check sugars twice daily   losartan (COZAAR) 100 mg tablet   No No   Sig: Take 1 Tab by mouth daily. Indications: hypertension    magnesium oxide (MAG-OX) 400 mg tablet   No No   Sig: TAKE 1 TABLET BY MOUTH DAILY   metFORMIN ER (GLUCOPHAGE XR) 750 mg tablet   No No   Sig: TAKE 2 TABLETS EVERY DAY   Patient taking differently: 750 mg. TAKE 2 TABLETS EVERY DAY   rosuvastatin (CRESTOR) 5 mg tablet   No No   Sig: TAKE 1 TABLET BY MOUTH NIGHTLY   spironolactone (ALDACTONE) 100 mg tablet   No No   Sig: TAKE 1 TABLET BY MOUTH DAILY   traZODone (DESYREL) 100 mg tablet   No No   Sig: TAKE 1 TABLET BY MOUTH NIGHTLY      Facility-Administered Medications: None     Allergies     Allergies   Allergen Reactions   ??? Chocolate [Cocoa] Sneezing     Physical Exam   ED Triage Vitals   ED Encounter Vitals Group      BP 01/19/17 1243 117/81      Pulse (Heart Rate) 01/19/17 1243 94      Resp Rate 01/19/17 1243 16      Temp 01/19/17 1243 98.1 ??F (36.7 ??C)      Temp src --       O2 Sat (%) 01/19/17 1243 99 %      Weight 01/19/17 1230 175 lb      Height 01/19/17 1230 _0      Constitutional: Patient appears well developed and well nourished. Appearance and behavior are age and situation appropriate.  HEENT: Conjunctiva clear.  PERRL.  Mucous membranes moist, non-erythematous.   Neck: Supple, symmetrical, no masses.   Respiratory: Lungs clear to auscultation, nonlabored respirations.   Cardiovascular: Heart regular rate and rhythm without murmur, rubs, or gallops.    Gastrointestinal:  Abdomen soft, nondistended without tenderness to palpation. No rebound or guarding. No CVA tenderness to palpation.  Musculoskeletal: Distal pulses 2+ and equal bilaterally.  No peripheral edema or significant variscosities. Calves soft and non-tender.   Integumentary: Warm and dry without rashes or lesions  Neurologic: Alert and oriented. No facial asymmetry or dysarthria noted. Moving all extremities well.  Impression and Management Plan  Patient is well-appearing with complaints of insomnia. Her vital signs are  stable. She has urinary symptoms. We'll obtain urinalysis. Do not believe that she is in need of labs at this time.  Diagnostic Studies   Lab:   Recent Results (from the past 12 hour(s))   POC URINE MACROSCOPIC    Collection Time: 01/19/17  1:33 PM   Result Value Ref Range    Glucose Negative NEGATIVE,Negative mg/dl    Bilirubin Negative NEGATIVE,Negative      Ketone Negative NEGATIVE,Negative mg/dl    Specific gravity 1.015 1.005 - 1.030      Blood Trace-intact (A) NEGATIVE,Negative      pH (UA) 8.5 5 - 9      Protein Negative NEGATIVE,Negative mg/dl    Urobilinogen 1.0 0.0 - 1.0 EU/dl    Nitrites Positive (A) NEGATIVE,Negative      Leukocyte Esterase Small (A) NEGATIVE,Negative      Color Yellow      Appearance Cloudy     POC URINE MICROSCOPIC    Collection Time: 01/19/17  1:33 PM   Result Value Ref Range    Epithelial cells, squamous 1-4 /LPF    WBC 15-29 /HPF    RBC 1-4 /HPF    Bacteria 4+ /HPF       Imaging:    No results found.  ED Course/ Medical Decision Making   Patient's urine shows a urinary tract infection. We will discharge her home with Keflex and sent urine for culture. Additionally we will prescribe Vistaril with recommendations to use at night for insomnia. This can be used in addition to melatonin but she has been advised to avoid Benadryl while on this medication. She is to call her primary care doctor tomorrow for follow-up appointment for next week. She has been informed that she can return to the emergency department at anytime if new or worsening symptoms.  Medications - No data to display    Discharge Medication List as of 01/19/2017  2:21 PM      START taking these medications    Details   cephALEXin (KEFLEX) 500 mg capsule Take 1 Cap by mouth two (2) times a day for 7 days., Print, Disp-14 Cap, R-0      hydrOXYzine pamoate (VISTARIL) 25 mg capsule Take 1 Cap by mouth nightly as needed for Itching for up to 14 days., Print, Disp-5 Cap, R-0          CONTINUE these medications which have NOT CHANGED    Details   magnesium oxide (MAG-OX) 400 mg tablet TAKE 1 TABLET BY MOUTH DAILY, Normal, Disp-90 Tab, R-0      benzonatate (TESSALON PERLES) 100 mg capsule Take 1 Cap by mouth three (3) times daily as needed for Cough., Print, Disp-20 Cap, R-0      rosuvastatin (CRESTOR) 5 mg tablet TAKE 1 TABLET BY MOUTH NIGHTLY, Normal, Disp-90 Tab, R-2      traZODone (DESYREL) 100 mg tablet TAKE 1 TABLET BY MOUTH NIGHTLY, Normal, Disp-90 Tab, R-0      spironolactone (ALDACTONE) 100 mg tablet TAKE 1 TABLET BY MOUTH DAILY, Normal, Disp-90 Tab, R-0      metFORMIN ER (GLUCOPHAGE XR) 750 mg tablet TAKE 2 TABLETS EVERY DAY, Normal, Disp-180 Tab, R-1      QUEtiapine SR (SEROQUEL XR) 150 mg sr tablet TAKE 1 TABLET BY MOUTH NIGHTLY, Normal, Disp-90 Tab, R-1      losartan (COZAAR) 100 mg tablet Take 1 Tab by mouth daily. Indications: hypertension, Normal, Disp-90 Tab,  R-2      gabapentin (NEURONTIN) 600 mg tablet TAKE 1 TABLET BY MOUTH THREE TIMES DAILY, Normal**Patient requests 90 days supply**Disp-270 Tab, R-2      albuterol (PROVENTIL HFA, VENTOLIN HFA, PROAIR HFA) 90 mcg/actuation inhaler Take 2 Puffs by inhalation every four (4) hours as needed for Wheezing., Normal, Disp-1 Inhaler, R-2      amLODIPine (NORVASC) 10 mg tablet TAKE ONE TABLET BY MOUTH ONCE DAILY, Normal, Disp-90 Tab, R-1      Blood-Glucose Meter monitoring kit Check sugars 2 times per day, fasting and 2 hours after dinner, Normal, Disp-1 Kit, R-0      Lancets misc Check sugars twice daily, Normal, Disp-200 Each, R-2      glucose blood VI test strips (ASCENSIA AUTODISC VI, ONE TOUCH ULTRA TEST VI) strip Check sugars twice daily, Normal, Disp-100 Strip, R-5             Final Diagnosis       ICD-10-CM ICD-9-CM   1. Urinary tract infection without hematuria, site unspecified N39.0 599.0   2. Difficulty sleeping G47.9 780.50       Disposition   Discharge    Scribe Attestation      Sherron Flemings acting as a Education administrator for and in the presence of [place], PA-C      January 19, 2017 at 12:57 PM       Provider Attestation:      I personally performed the services described in the documentation, reviewed the documentation, as recorded by the scribe in my presence, and it accurately and completely records my words and actions. January 19, 2017 at 12:57 PM - Harless Nakayama, PA-C    The patient was personally evaluated by myself and discussed with Terald Sleeper, MD who agrees with the above assessment and plan    Harless Nakayama, PA-C  January 19, 2017    My signature above authenticates this document and my orders, the final ??  diagnosis (es), discharge prescription (s), and instructions in the Epic ??  record.  If you have any questions please contact [phone].  ??  Nursing notes have been reviewed by the physician/ advanced practice ??  Clinician.

## 2017-01-19 NOTE — ED Notes (Signed)
2:51 PM  01/19/17     Discharge instructions given to [name] [name] (name) with verbalization of understanding. Patient accompanied by husband.  Patient discharged with the following prescriptions keflex/vistaril. Patient discharged to home (destination).      Sharon MtBrittany E Stephenson, RN

## 2017-01-20 LAB — CULTURE, URINE: Isolate: >100000 — CR

## 2017-01-25 ENCOUNTER — Ambulatory Visit: Admit: 2017-01-25 | Discharge: 2017-01-25 | Payer: MEDICARE | Attending: Family Medicine | Primary: Physician Assistant

## 2017-01-25 ENCOUNTER — Encounter

## 2017-01-25 DIAGNOSIS — M25551 Pain in right hip: Secondary | ICD-10-CM

## 2017-01-25 MED ORDER — KETOROLAC TROMETHAMINE 60 MG/2 ML IM
60 mg/2 mL | Freq: Once | INTRAMUSCULAR | 0 refills | Status: AC
Start: 2017-01-25 — End: 2017-01-25

## 2017-01-25 MED ORDER — OXYCODONE-ACETAMINOPHEN 10 MG-325 MG TAB
10-325 mg | ORAL_TABLET | Freq: Two times a day (BID) | ORAL | 0 refills | Status: DC | PRN
Start: 2017-01-25 — End: 2017-05-02

## 2017-01-25 MED ORDER — TRAZODONE 100 MG TAB
100 mg | ORAL_TABLET | ORAL | 1 refills | Status: DC
Start: 2017-01-25 — End: 2017-07-13

## 2017-01-25 MED ORDER — QUETIAPINE SR 150 MG 24 HR TAB
150 mg | ORAL_TABLET | ORAL | 1 refills | Status: DC
Start: 2017-01-25 — End: 2017-07-13

## 2017-01-25 MED ORDER — NABUMETONE 750 MG TAB
750 mg | ORAL_TABLET | Freq: Two times a day (BID) | ORAL | 0 refills | Status: DC | PRN
Start: 2017-01-25 — End: 2017-02-06

## 2017-01-25 NOTE — Patient Instructions (Addendum)
Please use Nabumetone 1 tab twice daily with food as needed for pain. Do not use ibuprofen or Aleve with this. Use percocet sparingly.  We are trying to establish pain management for you.             Hip Pain: Care Instructions  Your Care Instructions    Hip pain may be caused by many things, including overuse, a fall, or a twisting movement. Another cause of hip pain is arthritis. Your pain may increase when you stand up, walk, or squat. The pain may come and go or may be constant.  Home treatment can help relieve hip pain, swelling, and stiffness. If your pain is ongoing, you may need more tests and treatment.  Follow-up care is a key part of your treatment and safety. Be sure to make and go to all appointments, and call your doctor if you are having problems. It's also a good idea to know your test results and keep a list of the medicines you take.  How can you care for yourself at home?  ?? Take pain medicines exactly as directed.  ?? If the doctor gave you a prescription medicine for pain, take it as prescribed.  ?? If you are not taking a prescription pain medicine, ask your doctor if you can take an over-the-counter medicine.  ?? Rest and protect your hip. Take a break from any activity, including standing or walking, that may cause pain.  ?? Put ice or a cold pack against your hip for 10 to 20 minutes at a time. Try to do this every 1 to 2 hours for the next 3 days (when you are awake) or until the swelling goes down. Put a thin cloth between the ice and your skin.  ?? Sleep on your healthy side with a pillow between your knees, or sleep on your back with pillows under your knees.  ?? If there is no swelling, you can put moist heat, a heating pad, or a warm cloth on your hip. Do gentle stretching exercises to help keep your hip flexible.  ?? Learn how to prevent falls. Have your vision and hearing checked regularly. Wear slippers or shoes with a nonskid sole.  ?? Stay at a healthy weight.  ?? Wear comfortable shoes.   When should you call for help?  Call 911 anytime you think you may need emergency care. For example, call if:  ?? ?? You have sudden chest pain and shortness of breath, or you cough up blood.   ?? ?? You are not able to stand or walk or bear weight.   ?? ?? Your buttocks, legs, or feet feel numb or tingly.   ?? ?? Your leg or foot is cool or pale or changes color.   ?? ?? You have severe pain.   ??Call your doctor now or seek immediate medical care if:  ?? ?? You have signs of infection, such as:  ?? Increased pain, swelling, warmth, or redness in the hip area.  ?? Red streaks leading from the hip area.  ?? Pus draining from the hip area.  ?? A fever.   ?? ?? You have signs of a blood clot, such as:  ?? Pain in your calf, back of the knee, thigh, or groin.  ?? Redness and swelling in your leg or groin.   ?? ?? You are not able to bend, straighten, or move your leg normally.   ?? ?? You have trouble urinating or having bowel movements.   ??  Watch closely for changes in your health, and be sure to contact your doctor if:  ?? ?? You do not get better as expected.   Where can you learn more?  Go to [employer].  Enter [id number] in the search box to learn more about "Hip Pain: Care Instructions."  Current as of: May 02, 2016  Content Version: 11.7  ?? 2006-2018 [employer]. Care instructions adapted under license by [employer] (which disclaims liability or warranty for this information). If you have questions about a medical condition or this instruction, always ask your healthcare professional. [employer] disclaims any warranty or liability for your use of this information.
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
## 2017-01-26 ENCOUNTER — Encounter

## 2017-01-26 NOTE — Telephone Encounter (Signed)
Left message on pharmacy line that Nabumetone 750 mg is a short term med and do not want to fill 90 day supply. Please keep med at a 30 day supply

## 2017-01-27 MED ORDER — METFORMIN SR 750 MG 24 HR TABLET
750 mg | ORAL_TABLET | ORAL | 1 refills | Status: DC
Start: 2017-01-27 — End: 2017-05-12

## 2017-02-06 ENCOUNTER — Encounter

## 2017-02-14 MED ORDER — NABUMETONE 750 MG TAB
750 mg | ORAL_TABLET | ORAL | 0 refills | Status: DC
Start: 2017-02-14 — End: 2017-02-26

## 2017-02-22 ENCOUNTER — Ambulatory Visit
Admit: 2017-02-22 | Discharge: 2017-02-22 | Payer: MEDICARE | Attending: Physician Assistant | Primary: Physician Assistant

## 2017-02-22 ENCOUNTER — Encounter

## 2017-02-22 DIAGNOSIS — J4520 Mild intermittent asthma, uncomplicated: Secondary | ICD-10-CM

## 2017-02-22 MED ORDER — ALBUTEROL SULFATE HFA 90 MCG/ACTUATION AEROSOL INHALER
90 mcg/actuation | RESPIRATORY_TRACT | 1 refills | Status: DC | PRN
Start: 2017-02-22 — End: 2017-04-17

## 2017-02-22 MED ORDER — TRIAMCINOLONE ACETONIDE 55 MCG NASAL SPRAY AEROSOL
55 mcg | Freq: Every day | NASAL | 0 refills | Status: DC
Start: 2017-02-22 — End: 2018-08-05

## 2017-02-22 MED ORDER — MONTELUKAST 10 MG TAB
10 mg | ORAL_TABLET | Freq: Every day | ORAL | 5 refills | Status: DC
Start: 2017-02-22 — End: 2017-02-22

## 2017-02-22 MED ORDER — MONTELUKAST 10 MG TAB
10 mg | ORAL_TABLET | ORAL | 1 refills | Status: DC
Start: 2017-02-22 — End: 2017-08-29

## 2017-02-22 NOTE — Progress Notes (Signed)
HISTORY OF PRESENT ILLNESS  [name] [name] is a 66 y.o. female.  HPI   [name] [name] is a 66 y.o. female who presents to the office today for cough.  Patient walked in today for an acute visit. She has c/o cough x 2 weeks. It is mostly dry with occasional white phlegm. She does have a hx of mild intermittent asthma and her albuterol inhaler helps.   She mentions that she just moved into her father's house 2 weeks. There is carpet in the house. She noticed that on the drive to our office she did not cough, and the entire time she was in the office she did not cough.     Her father passed away last week. He was 59 years old. She is still dealing with his death.    Chief Complaint   Patient presents with   ??? Cough     pt c/o chest congestion with somewhat productive cough for past 2wks   ??? Depression     positive depression screening, pt recently lost her brother and is having difficulty coping- cries daily   Current Outpatient Prescriptions on File Prior to Visit   Medication Sig Dispense Refill   ??? metFORMIN ER (GLUCOPHAGE XR) 750 mg tablet TAKE 2 TABLETS EVERY DAY 180 Tab 1   ??? magnesium oxide (MAG-OX) 400 mg tablet TAKE 1 TABLET BY MOUTH DAILY 90 Tab 0   ??? benzonatate (TESSALON PERLES) 100 mg capsule Take 1 Cap by mouth three (3) times daily as needed for Cough. 20 Cap 0   ??? spironolactone (ALDACTONE) 100 mg tablet TAKE 1 TABLET BY MOUTH DAILY 90 Tab 0   ??? losartan (COZAAR) 100 mg tablet Take 1 Tab by mouth daily. Indications: hypertension 90 Tab 2   ??? amLODIPine (NORVASC) 10 mg tablet TAKE ONE TABLET BY MOUTH ONCE DAILY 90 Tab 1   ??? Blood-Glucose Meter monitoring kit Check sugars 2 times per day, fasting and 2 hours after dinner 1 Kit 0   ??? Lancets misc Check sugars twice daily 200 Each 2   ??? glucose blood VI test strips (ASCENSIA AUTODISC VI, ONE TOUCH ULTRA TEST VI) strip Check sugars twice daily 100 Strip 5   ??? nabumetone (RELAFEN) 750 mg tablet TAKE 1 TABLET BY MOUTH TWICE DAILY AS  NEEDED FOR PAIN (Patient not taking: Reported on 02/22/2017) 30 Tab 0   ??? QUEtiapine SR (SEROQUEL XR) 150 mg sr tablet TAKE 1 TABLET BY MOUTH NIGHTLY (Patient not taking: Reported on 02/22/2017) 90 Tab 1   ??? traZODone (DESYREL) 100 mg tablet TAKE 1 TABLET BY MOUTH NIGHTLY (Patient not taking: Reported on 02/22/2017) 90 Tab 1   ??? oxyCODONE-acetaminophen (PERCOCET 10) 10-325 mg per tablet Take 1 Tab by mouth two (2) times daily as needed for Pain. Max Daily Amount: 2 Tabs. (Patient not taking: Reported on 02/22/2017) 10 Tab 0   ??? rosuvastatin (CRESTOR) 5 mg tablet TAKE 1 TABLET BY MOUTH NIGHTLY (Patient not taking: Reported on 02/22/2017) 90 Tab 2   ??? albuterol (PROVENTIL HFA, VENTOLIN HFA, PROAIR HFA) 90 mcg/actuation inhaler Take 2 Puffs by inhalation every four (4) hours as needed for Wheezing. 1 Inhaler 2     No current facility-administered medications on file prior to visit.    Allergies   Allergen Reactions   ??? Chocolate [Cocoa] Sneezing   Past Medical History:   Diagnosis Date   ??? Arthritis    ??? Chronic pain    ??? Diabetes ([place])    ???  Emphysema lung ([place])    ??? GERD (gastroesophageal reflux disease)    ??? Hypertension    ??? Liver disease     HEP C   ??? PTSD (post-traumatic stress disorder)     lived through the [place] in 1993   History   Smoking Status   ??? Never Smoker   Smokeless Tobacco   ??? Never Used   History   Alcohol Use No   Family History   Problem Relation Age of Onset   ??? Hypertension Mother    ??? Cancer Maternal Aunt    ??? Alcohol abuse Maternal Grandfather    Review of Systems   Constitutional: Negative for chills, diaphoresis, fever and malaise/fatigue.   HENT: Negative for congestion, ear pain, sinus pain and sore throat.    Eyes: Negative for discharge and redness.   Respiratory: Positive for cough and sputum production. Negative for shortness of breath and wheezing.         White sputum   Cardiovascular: Negative for chest pain and palpitations.    Gastrointestinal: Negative for abdominal pain, diarrhea, nausea and vomiting.   Skin: Negative for rash.   Neurological: Negative for dizziness.   Endo/Heme/Allergies: Negative for environmental allergies.     Visit Vitals   ??? BP 120/90 (BP 1 Location: Left arm, BP Patient Position: Sitting)   ??? Pulse 74   ??? Temp 96.7 ??F (35.9 ??C) (Oral)   ??? Resp 18   ??? Ht 5' 8"  (1.727 m)   ??? Wt 187 lb (84.8 kg)   ??? SpO2 97%   ??? BMI 28.43 kg/m2     Physical Exam   Constitutional: She is oriented to person, place, and time. She appears well-developed and well-nourished. No distress.   HENT:   Right Ear: Tympanic membrane, external ear and ear canal normal.   Left Ear: Tympanic membrane, external ear and ear canal normal.   Nose: Mucosal edema present. Right sinus exhibits no maxillary sinus tenderness and no frontal sinus tenderness. Left sinus exhibits no maxillary sinus tenderness and no frontal sinus tenderness.   Mouth/Throat: Uvula is midline, oropharynx is clear and moist and mucous membranes are normal.   Eyes: Conjunctivae are normal.   Neck: Neck supple. No thyromegaly present.   Cardiovascular: Normal rate, regular rhythm and normal heart sounds.    Pulmonary/Chest: Effort normal and breath sounds normal. No respiratory distress. She has no wheezes. She has no rales.   Musculoskeletal: She exhibits no edema.   Lymphadenopathy:     She has no cervical adenopathy.   Neurological: She is alert and oriented to person, place, and time.   Skin: Skin is warm and dry.   Psychiatric: She has a normal mood and affect. Her behavior is normal.   Appropriately Upset about father's passing   Nursing note and vitals reviewed.      ASSESSMENT and PLAN    ICD-10-CM ICD-9-CM    1. Mild intermittent asthma, unspecified whether complicated Z61.09 604.54 montelukast (SINGULAIR) 10 mg tablet      albuterol (PROVENTIL HFA, VENTOLIN HFA, PROAIR HFA) 90 mcg/actuation inhaler      triamcinolone (NASACORT) 55 mcg nasal inhaler    2. Chronic rhinitis J31.0 472.0 montelukast (SINGULAIR) 10 mg tablet      albuterol (PROVENTIL HFA, VENTOLIN HFA, PROAIR HFA) 90 mcg/actuation inhaler      triamcinolone (NASACORT) 55 mcg nasal inhaler      I believe there is something in her father's house that is  causing the cough, especially that she did not cough at all in the office today. Albuterol helps and she was given a refill today. Starting singulair and a steroid nasal spray. I have suggested she have the house and carpets thoroughly cleaned in the new house.   Reviewed medication and side effects. Patient agrees with the plan and verbalizes understanding.     Follow-up Disposition:  Return if symptoms worsen or fail to improve.    Wallene Huh, PA-C  02/22/2017

## 2017-02-22 NOTE — Patient Instructions (Signed)
Montelukast (By mouth)   Montelukast (mon-te-LOO-kast)  Prevents asthma attacks and treats allergies.   Brand Name(s): Singulair   There may be other brand names for this medicine.  When This Medicine Should Not Be Used:   This medicine is not right for everyone. Do not use it if you had an allergic reaction to montelukast.  How to Use This Medicine:   Packet, Tablet, Chewable Tablet  ?? Your doctor will tell you how much medicine to use. Do not use more than directed.  ?? Read and follow the patient instructions that come with this medicine. Talk to your doctor or pharmacist if you have any questions.  ?? Oral granules: Do not open the packet until you are ready to use it. You can give the oral granules in one of three ways.  ?? Put the oral granules directly on a spoon, then into the person's mouth.  ?? Mix the granules with baby formula or breast milk that is cold or room temperature. Do not mix the granules with any other liquid.  ?? Mix the granules with applesauce, mashed carrots, rice, or ice cream. Do not mix the granules with any other type of soft food.  ?? If the medicine is mixed with formula, breast milk, or food, use it right away. Do not save any mixed medicine to use later.  ?? Missed dose: Take a dose as soon as you remember. If it is almost time for your next dose, wait until then and take a regular dose. Do not take extra medicine to make up for a missed dose.  ?? Store the medicine in the original container at room temperature, away from heat and direct light.  Drugs and Foods to Avoid:      Ask your doctor or pharmacist before using any other medicine, including over-the-counter medicines, vitamins, and herbal products.      Warnings While Using This Medicine:   ?? Tell your doctor if you are pregnant or breastfeeding, or if you have liver disease, have a condition called phenylketonuria (PKU), or are allergic to aspirin.  ?? This medicine will not stop an asthma attack that has already started.  Your doctor may prescribe another medicine for a sudden asthma attack.  ?? If you use a corticosteroid medicine to control your asthma, keep using it as instructed by your doctor.  ?? If any of your asthma medicines do not seem to be working as well as usual, call your doctor right away. Do not change your doses or stop using your medicines without asking your doctor.  ?? This medicine may cause some people to be agitated, disoriented, irritable, or reckless. It may also cause depression or suicidal thoughts. Tell your doctor if you have any unusual thoughts or behaviors that trouble you.  ?? This medicine may make you dizzy or drowsy. Do not drive or do anything else that could be dangerous until you know how this medicine affects you.  ?? Keep all medicine out of the reach of children. Never share your medicine with anyone.  Possible Side Effects While Using This Medicine:   Call your doctor right away if you notice any of these side effects:  ?? Allergic reaction: Itching or hives, swelling in your face or hands, swelling or tingling in your mouth or throat, chest tightness, trouble breathing  ?? Blistering, peeling, red skin rash  ?? Chest pain, or a fast, pounding, or uneven heartbeat  ?? Numbness or tingling in the hands, arms, legs,   or feet  ?? Pain and swelling in your sinuses  ?? Restlessness, anxiety, irritability, mood or behavior changes, or thoughts of hurting yourself or others  ?? Sudden and severe stomach pain, nausea, vomiting, fever, lightheadedness  ?? Unusual bleeding or bruising  If you notice these less serious side effects, talk with your doctor:   ?? Cough, sore throat, or flu symptoms  ?? Headache  If you notice other side effects that you think are caused by this medicine, tell your doctor.   Call your doctor for medical advice about side effects. You may report side effects to FDA at 1-800-FDA-1088  ?? 2017 [employer] Information is for End User's use only  and may not be sold, redistributed or otherwise used for commercial purposes.  The above information is an educational aid only. It is not intended as medical advice for individual conditions or treatments. Talk to your doctor, nurse or pharmacist before following any medical regimen to see if it is safe and effective for you.

## 2017-02-22 NOTE — Progress Notes (Signed)
Identified pt with two pt identifiers(name and DOB). Reviewed record in preparation for visit and have obtained necessary documentation.  Chief Complaint   Patient presents with   ??? Cough     pt c/o chest congestion with somewhat productive cough for past 2wks   ??? Depression     positive depression screening, pt recently lost her brother and is having difficulty coping- cries daily        Health Maintenance Due   Topic   ??? FOOT EXAM Q1    ??? EYE EXAM RETINAL OR DILATED Q1    ??? DTaP/Tdap/Td series (1 - Tdap)   ??? ZOSTER VACCINE AGE 13>    ??? Pneumococcal 19-64 Highest Risk (2 of 3 - PCV13)   ??? Influenza Age 579 to Adult        Coordination of Care Questionnaire:  :   1) Have you been to an emergency room, urgent care clinic since your last visit? no   Hospitalized since your last visit? no             2. Have seen or consulted any other health care provider since your last visit? NO  If yes, where when, and reason for visit?      3) Do you have an Advanced Directive/ Living Will in place? YES  If yes, do we have a copy on file YES  If no, would you like information NO    Patient is accompanied by self I have received verbal consent from Anne ShutterMonica R Ridgeway to discuss any/all medical information while they are present in the room.

## 2017-02-26 ENCOUNTER — Encounter

## 2017-02-27 MED ORDER — NABUMETONE 750 MG TAB
750 mg | ORAL_TABLET | ORAL | 0 refills | Status: DC
Start: 2017-02-27 — End: 2017-03-09

## 2017-03-09 ENCOUNTER — Encounter

## 2017-03-10 MED ORDER — NABUMETONE 750 MG TAB
750 mg | ORAL_TABLET | ORAL | 0 refills | Status: DC
Start: 2017-03-10 — End: 2017-03-22

## 2017-03-22 ENCOUNTER — Encounter

## 2017-03-23 MED ORDER — NABUMETONE 750 MG TAB
750 mg | ORAL_TABLET | ORAL | 1 refills | Status: DC
Start: 2017-03-23 — End: 2017-04-22

## 2017-04-15 ENCOUNTER — Encounter

## 2017-04-17 MED ORDER — VENTOLIN HFA 90 MCG/ACTUATION AEROSOL INHALER
90 mcg/actuation | RESPIRATORY_TRACT | 2 refills | Status: DC
Start: 2017-04-17 — End: 2017-08-09

## 2017-04-22 ENCOUNTER — Encounter

## 2017-04-24 ENCOUNTER — Encounter

## 2017-04-24 MED ORDER — NABUMETONE 750 MG TAB
750 mg | ORAL_TABLET | ORAL | 0 refills | Status: DC
Start: 2017-04-24 — End: 2017-05-12

## 2017-04-24 MED ORDER — MAGNESIUM OXIDE 400 MG TAB
400 mg | ORAL_TABLET | ORAL | 0 refills | Status: DC
Start: 2017-04-24 — End: 2017-08-09

## 2017-05-02 ENCOUNTER — Ambulatory Visit: Admit: 2017-05-02 | Discharge: 2017-05-02 | Payer: MEDICARE | Attending: Family Medicine | Primary: Physician Assistant

## 2017-05-02 DIAGNOSIS — M25551 Pain in right hip: Secondary | ICD-10-CM

## 2017-05-02 MED ORDER — PREDNISONE 20 MG TAB
20 mg | ORAL_TABLET | ORAL | 0 refills | Status: DC
Start: 2017-05-02 — End: 2017-06-18

## 2017-05-02 MED ORDER — OXYCODONE-ACETAMINOPHEN 10 MG-325 MG TAB
10-325 mg | ORAL_TABLET | Freq: Two times a day (BID) | ORAL | 0 refills | Status: DC | PRN
Start: 2017-05-02 — End: 2017-07-13

## 2017-05-02 MED ORDER — KETOROLAC TROMETHAMINE 30 MG/ML INJECTION
30 mg/mL (1 mL) | Freq: Once | INTRAMUSCULAR | 0 refills | Status: AC
Start: 2017-05-02 — End: 2017-05-02

## 2017-05-02 NOTE — Progress Notes (Signed)
[name] [name] is a 66 y.o. female here today with c/o right hip and groin pain. She states the pain has been going on x 2 weeks.

## 2017-05-02 NOTE — Progress Notes (Signed)
Hip Pain        HPI: [name] [name] is a 66 y.o. female [place] here with worsening right hip pain for 2 weeks, no recent injury.  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  She reports she is now having radiation of her pain into the  right groin area.  She denies any bladder or bowel dysfunction.  She denies saddle paresthesia.        Past Medical History:   Diagnosis Date   ??? Arthritis    ??? Chronic pain    ??? Diabetes ([place])    ??? Emphysema lung ([place])    ??? GERD (gastroesophageal reflux disease)    ??? Hypertension    ??? Liver disease     HEP C   ??? PTSD (post-traumatic stress disorder)     lived through the [place] in 1993       Current Outpatient Medications   Medication Sig   ??? oxyCODONE-acetaminophen (PERCOCET 10) 10-325 mg per tablet Take 1 Tab by mouth two (2) times daily as needed for Pain. Max Daily Amount: 2 Tabs.   ??? predniSONE (DELTASONE) 20 mg tablet Take 3 tabs daily for 3 days, 2 tabs daily for 3 days, 1 tab daily for 3 days, then 1/2 tablet daily for 3 days   ??? nabumetone (RELAFEN) 750 mg tablet TAKE 1 TABLET BY MOUTH TWICE DAILY AS NEEDED FOR PAIN   ??? magnesium oxide (MAG-OX) 400 mg tablet TAKE 1 TABLET BY MOUTH DAILY   ??? VENTOLIN HFA 90 mcg/actuation inhaler INHALE 2 PUFFS BY MOUTH EVERY 4 HOURS AS NEEDED FOR WHEEZING   ??? triamcinolone (NASACORT) 55 mcg nasal inhaler 2 Sprays by Both Nostrils route daily.   ??? montelukast (SINGULAIR) 10 mg tablet TAKE 1 TABLET BY MOUTH DAILY   ??? metFORMIN ER (GLUCOPHAGE XR) 750 mg tablet TAKE 2 TABLETS EVERY DAY   ??? QUEtiapine SR (SEROQUEL XR) 150 mg sr tablet TAKE 1 TABLET BY MOUTH NIGHTLY   ??? benzonatate (TESSALON PERLES) 100 mg capsule Take 1 Cap by mouth three (3) times daily as needed for Cough.   ??? rosuvastatin (CRESTOR) 5 mg tablet TAKE 1 TABLET BY MOUTH NIGHTLY   ??? spironolactone (ALDACTONE) 100 mg tablet TAKE 1 TABLET BY MOUTH DAILY    ??? losartan (COZAAR) 100 mg tablet Take 1 Tab by mouth daily. Indications: hypertension   ??? amLODIPine (NORVASC) 10 mg tablet TAKE ONE TABLET BY MOUTH ONCE DAILY   ??? Lancets misc Check sugars twice daily   ??? glucose blood VI test strips (ASCENSIA AUTODISC VI, ONE TOUCH ULTRA TEST VI) strip Check sugars twice daily   ??? traZODone (DESYREL) 100 mg tablet TAKE 1 TABLET BY MOUTH NIGHTLY (Patient not taking: Reported on 02/22/2017)   ??? Blood-Glucose Meter monitoring kit Check sugars 2 times per day, fasting and 2 hours after dinner     No current facility-administered medications for this visit.        Allergies   Allergen Reactions   ??? Chocolate [Cocoa] Sneezing       Past Surgical History:   Procedure Laterality Date   ??? HX ENDOSCOPY     ??? HX GYN  2010    hysterectomy   ??? HX HIP REPLACEMENT Right 01/29/2015   ??? HX HIP REPLACEMENT         Family History   Problem Relation Age  of Onset   ??? Hypertension Mother    ??? Cancer Maternal Aunt    ??? Alcohol abuse Maternal Grandfather        Social History     Socioeconomic History   ??? Marital status: MARRIED     Spouse name: Not on file   ??? Number of children: Not on file   ??? Years of education: Not on file   ??? Highest education level: Not on file   Social Needs   ??? Financial resource strain: Not on file   ??? Food insecurity - worry: Not on file   ??? Food insecurity - inability: Not on file   ??? Transportation needs - medical: Not on file   ??? Transportation needs - non-medical: Not on file   Occupational History   ??? Not on file   Tobacco Use   ??? Smoking status: Never Smoker   ??? Smokeless tobacco: Never Used   Substance and Sexual Activity   ??? Alcohol use: No   ??? Drug use: Yes     Types: Marijuana   ??? Sexual activity: No   Other Topics Concern   ??? Not on file   Social History Narrative   ??? Not on file       Review of Systems   Constitutional: Negative for fever.   Musculoskeletal: Positive for joint pain. Negative for falls.   Skin: Negative for rash.         Visit Vitals   BP (!) 133/93 (BP 1 Location: Left arm, BP Patient Position: Sitting)   Pulse 96   Temp 98.6 ??F (37 ??C) (Oral)   Resp 18   Ht _0  (1.727 m)   Wt 200 lb 3.2 oz (90.8 kg)   SpO2 96%   BMI 30.44 kg/m??       Physical Exam   Constitutional: She is oriented to person, place, and time. She appears well-developed and well-nourished. No distress.   Sitting in wheelchair   HENT:   Head: Normocephalic and atraumatic.   Right Ear: External ear normal.   Left Ear: External ear normal.   Cardiovascular: Normal rate, regular rhythm and normal heart sounds.   Pulmonary/Chest: Effort normal and breath sounds normal.   Musculoskeletal:        Right hip: She exhibits decreased range of motion and decreased strength. She exhibits no tenderness, no bony tenderness, no crepitus and no deformity.   Neurological: She is alert and oriented to person, place, and time.   Skin: Skin is warm and dry. She is not diaphoretic.         Assesment:  1. Right hip pain  I suspect this is a manifestation of her continued right hip pain after her surgery.  She is formally in pain management for this.  Previous x-rays have been normal.  Will provide pain relief and short course of prednisone taper.  if pain persist may need updated imaging    - ketorolac (TORADOL) 30 mg/mL (1 mL) injection; 2 mL by IntraMUSCular route once for 1 dose.  Dispense: 2 Vial; Refill: 0  - oxyCODONE-acetaminophen (PERCOCET 10) 10-325 mg per tablet; Take 1 Tab by mouth two (2) times daily as needed for Pain. Max Daily Amount: 2 Tabs.  Dispense: 10 Tab; Refill: 0    2. Radicular pain    - ketorolac (TORADOL) 30 mg/mL (1 mL) injection; 2 mL by IntraMUSCular route once for 1 dose.  Dispense: 2 Vial; Refill: 0  - oxyCODONE-acetaminophen (PERCOCET 10)  10-325 mg per tablet; Take 1 Tab by mouth two (2) times daily as needed for Pain. Max Daily Amount: 2 Tabs.  Dispense: 10 Tab; Refill: 0  - predniSONE (DELTASONE) 20 mg tablet; Take 3 tabs daily for 3 days, 2  tabs daily for 3 days, 1 tab daily for 3 days, then 1/2 tablet daily for 3 days  Dispense: 20 Tab; Refill: 0          I have discussed the diagnosis with the patient and the intended management  The patient has received an after-visit summary and questions were answered concerning future plans.  I have discussed medication usage, side effects and warnings with the patient as well.I have reviewed the plan of care with the patient, accepted their input and they are in agreement with the treatment goals.         Follow-up Disposition: Not on File      Lake Bells, MD                .

## 2017-05-09 MED ORDER — POLYETHYLENE GLYCOL 3350 17 GRAM (100 %) ORAL POWDER PACKET
17 gram | PACK | Freq: Every day | ORAL | 1 refills | Status: DC
Start: 2017-05-09 — End: 2018-02-24

## 2017-05-12 ENCOUNTER — Encounter

## 2017-05-16 MED ORDER — NABUMETONE 750 MG TAB
750 mg | ORAL_TABLET | ORAL | 0 refills | Status: DC
Start: 2017-05-16 — End: 2017-05-31

## 2017-05-16 MED ORDER — SPIRONOLACTONE 100 MG TAB
100 mg | ORAL_TABLET | ORAL | 0 refills | Status: DC
Start: 2017-05-16 — End: 2017-08-14

## 2017-05-16 MED ORDER — METFORMIN SR 750 MG 24 HR TABLET
750 mg | ORAL_TABLET | ORAL | 1 refills | Status: DC
Start: 2017-05-16 — End: 2017-07-13

## 2017-05-31 ENCOUNTER — Encounter

## 2017-06-01 ENCOUNTER — Encounter

## 2017-06-05 MED ORDER — NABUMETONE 750 MG TAB
750 mg | ORAL_TABLET | ORAL | 0 refills | Status: DC
Start: 2017-06-05 — End: 2017-06-15

## 2017-06-08 ENCOUNTER — Encounter: Attending: Family Medicine | Primary: Physician Assistant

## 2017-06-14 ENCOUNTER — Encounter

## 2017-06-15 ENCOUNTER — Observation Stay

## 2017-06-15 ENCOUNTER — Emergency Department: Admit: 2017-06-16 | Payer: MEDICARE | Primary: Physician Assistant

## 2017-06-15 DIAGNOSIS — J441 Chronic obstructive pulmonary disease with (acute) exacerbation: Secondary | ICD-10-CM

## 2017-06-15 MED ORDER — NABUMETONE 750 MG TAB
750 mg | ORAL_TABLET | ORAL | 0 refills | Status: DC
Start: 2017-06-15 — End: 2017-07-05

## 2017-06-15 NOTE — Progress Notes (Signed)
Gave the patient a second dosage  Of 7.5 mg of Albuterol and 0.5 mg Atrovent HHN, for shortness of breath and exp wheezing

## 2017-06-15 NOTE — H&P (Signed)
Chart reviewed.  Patient seen and examined on 06/15/2017.  Complete H&P to follow.

## 2017-06-15 NOTE — Progress Notes (Signed)
Gave patient a 7.5 mg of Albuterol and 0.5 mg Atrovent HHN, for shortness of breath

## 2017-06-15 NOTE — ED Notes (Signed)
Plans to admit to tele pt is resting she has no complaints at this time resting on streacher vss in no distress.

## 2017-06-15 NOTE — H&P (Signed)
History and Physical    DOS: June 15, 2017    Patient: [name] [name]               Sex: female          DOA: 06/15/2017       Date of Birth:  10/24/1950      Age:  67 y.o.        LOS:  LOS: 0 days       MRN: [id number]       Impression/Plan       67 y.o. female with hx of HTN, COPD, Hep C pos, and DM with the following:        1.  Asthma/ COPD Exacerbation   2.  SOB   3.  HTN   4.  Tachycardia   5.  DM   6.  Hx Hep C Positive      PLAN:     Admit to OBS, tele   O2 as needed.  GI and DVT prophylaxis.  Replace electrolytes as needed.  Handheld nebs, wean as tolerated.  IV Solu-Medrol, taper as tolerated.  hydrate with IV fluids and monitor to avoid fluid overload.  Monitor blood glucose levels.  Glucomander per protocol.  Continue home medications as needed.  Further recommendations based on test results, response to treatment, and clinical course.        Chief Complaint:     Chief Complaint   Patient presents with   ??? Respiratory Distress       HPI:       [name] [name] is a 67 y.o. female with hx of HTN, COPD, Hep C pos, and DM with c/o having abrupt onset of shortness of breath and midsternal chest tightness beginning in the afternoon.  She has associated wheezing and her symptoms are similar to her asthma exacerbations in the past.  Patient could not find her inhaler at home and EMS was called for transport.  She denies any fever or chills.  No nausea, vomiting, abdominal pain, or diarrhea.  Her chest discomfort is increased with taking deep breaths.  She denies any readings symptoms to her neck or arms.  She is not had any lower extremity pain or swelling.      Past Medical History:   Diagnosis Date   ??? Arthritis    ??? Chronic pain    ??? Diabetes ([place])    ??? Emphysema lung ([place])    ??? GERD (gastroesophageal reflux disease)    ??? Hypertension    ??? Liver disease     HEP C   ??? PTSD (post-traumatic stress disorder)     lived through the [place] in 1993       Past Surgical History:    Procedure Laterality Date   ??? HX ENDOSCOPY     ??? HX GYN  2010    hysterectomy   ??? HX HIP REPLACEMENT Right 01/29/2015   ??? HX HIP REPLACEMENT         Family History   Problem Relation Age of Onset   ??? Hypertension Mother    ??? Cancer Maternal Aunt    ??? Alcohol abuse Maternal Grandfather        Social History     Socioeconomic History   ??? Marital status: MARRIED     Spouse name: Not on file   ??? Number of children: Not on file   ??? Years of education: Not on file   ???  Highest education level: Not on file   Tobacco Use   ??? Smoking status: Never Smoker   ??? Smokeless tobacco: Never Used   Substance and Sexual Activity   ??? Alcohol use: No   ??? Drug use: Yes     Types: Marijuana   ??? Sexual activity: No       Prior to Admission medications    Medication Sig Start Date End Date Taking? Authorizing Provider   nabumetone (RELAFEN) 750 mg tablet TAKE 1 TABLET BY MOUTH TWICE DAILY AS NEEDED FOR PAIN 06/15/17   Lake Bells, MD   metFORMIN ER (GLUCOPHAGE XR) 750 mg tablet TAKE 2 TABLETS EVERY DAY 05/16/17   Lake Bells, MD   spironolactone (ALDACTONE) 100 mg tablet TAKE 1 TABLET BY MOUTH DAILY 05/16/17   Lake Bells, MD   polyethylene glycol (MIRALAX) 17 gram packet Take 1 Packet by mouth daily. 05/09/17   Lake Bells, MD   oxyCODONE-acetaminophen (PERCOCET 10) 10-325 mg per tablet Take 1 Tab by mouth two (2) times daily as needed for Pain. Max Daily Amount: 2 Tabs. 05/02/17   Lake Bells, MD   predniSONE (DELTASONE) 20 mg tablet Take 3 tabs daily for 3 days, 2 tabs daily for 3 days, 1 tab daily for 3 days, then 1/2 tablet daily for 3 days 05/02/17   Lake Bells, MD   magnesium oxide (MAG-OX) 400 mg tablet TAKE 1 TABLET BY MOUTH DAILY 04/24/17   Lake Bells, MD   VENTOLIN HFA 90 mcg/actuation inhaler INHALE 2 PUFFS BY MOUTH EVERY 4 HOURS AS NEEDED FOR WHEEZING 04/17/17   Lake Bells, MD   triamcinolone (NASACORT) 55 mcg nasal inhaler 2 Sprays by Both Nostrils  route daily. 02/22/17   Wallene Huh, PA-C   montelukast (SINGULAIR) 10 mg tablet TAKE 1 TABLET BY MOUTH DAILY 02/22/17   Wallene Huh, PA-C   QUEtiapine SR (SEROQUEL XR) 150 mg sr tablet TAKE 1 TABLET BY MOUTH NIGHTLY 01/25/17   Lake Bells, MD   traZODone (DESYREL) 100 mg tablet TAKE 1 TABLET BY MOUTH NIGHTLY  Patient not taking: Reported on 02/22/2017 01/25/17   Lake Bells, MD   benzonatate (TESSALON PERLES) 100 mg capsule Take 1 Cap by mouth three (3) times daily as needed for Cough. 01/09/17   Antoine Primas, PA-C   rosuvastatin (CRESTOR) 5 mg tablet TAKE 1 TABLET BY MOUTH NIGHTLY 12/12/16   Lake Bells, MD   losartan (COZAAR) 100 mg tablet Take 1 Tab by mouth daily. Indications: hypertension 08/07/16   Lake Bells, MD   amLODIPine (NORVASC) 10 mg tablet TAKE ONE TABLET BY MOUTH ONCE DAILY 07/01/16   Lake Bells, MD   Blood-Glucose Meter monitoring kit Check sugars 2 times per day, fasting and 2 hours after dinner 07/15/15   Lake Bells, MD   Lancets misc Check sugars twice daily 07/15/15   Lake Bells, MD   glucose blood VI test strips (ASCENSIA AUTODISC VI, ONE TOUCH ULTRA TEST VI) strip Check sugars twice daily 07/15/15   Lake Bells, MD       Allergies   Allergen Reactions   ??? Chocolate [Cocoa] Sneezing       Review of Systems:    1) See above. 2) A 12 point review of systems has been obtained. ROS is otherwise noncontributory.      Physical Exam:      Visit Vitals  BP 114/81   Pulse (!) 114  Resp 26   Ht 5' 8" (1.727 m)   Wt 81.6 kg (180 lb)   SpO2 100%   BMI 27.37 kg/m??       No intake or output data in the 24 hours ending 06/15/17 2250    Physical Exam:    HEENT: NC/AT, PERRLA, EOMI, dry mucous membranes  Neck: No JVD no bruits, supple, nontender, no significant LAD  LYMPH: No supraclavicular or cervical or axillary nodes on both sides  Cardiovascular: Heart, increased rate, RR, no M, R, G   Lungs: decreased breath sounds at bases, coarse upper airway sounds, pos wheezes, no crackles  Abd: Soft, non tender, not distended, No guarding, No rigidity, BS normal  NEURO:  Non focal, normal strength. CN II - XII intact bilaterally   Extrm: no leg edema   Skin: No rashes or lesions       Labs Reviewed:     Recent Results (from the past 24 hour(s))   CBC WITH AUTOMATED DIFF    Collection Time: 06/15/17  8:50 PM   Result Value Ref Range    WBC 17.8 (H) 4.0 - 11.0 1000/mm3    RBC 4.40 3.60 - 5.20 M/uL    HGB 12.9 (L) 13.0 - 17.2 gm/dl    HCT 38.1 37.0 - 50.0 %    MCV 86.6 80.0 - 98.0 fL    MCH 29.3 25.4 - 34.6 pg    MCHC 33.9 30.0 - 36.0 gm/dl    PLATELET 266 140 - 450 1000/mm3    MPV 10.9 (H) 6.0 - 10.0 fL    RDW-SD 40.9 36.4 - 46.3      NRBC 0 0 - 0      IMMATURE GRANULOCYTES 0.5 0.0 - 3.0 %    NEUTROPHILS 76.3 (H) 34 - 64 %    LYMPHOCYTES 17.6 (L) 28 - 48 %    MONOCYTES 5.2 1 - 13 %    EOSINOPHILS 0.2 0 - 5 %    BASOPHILS 0.2 0 - 3 %   METABOLIC PANEL, BASIC    Collection Time: 06/15/17  8:50 PM   Result Value Ref Range    Sodium 134 (L) 136 - 145 mEq/L    Potassium 3.7 3.5 - 5.1 mEq/L    Chloride 100 98 - 107 mEq/L    CO2 27 21 - 32 mEq/L    Glucose 218 (H) 74 - 106 mg/dl    BUN 20 7 - 25 mg/dl    Creatinine 1.1 0.6 - 1.3 mg/dl    GFR est AA >60.0      GFR est non-AA 53      Calcium 9.1 8.5 - 10.1 mg/dl    Anion gap 7 5 - 15 mmol/L   TROPONIN I    Collection Time: 06/15/17  8:50 PM   Result Value Ref Range    Troponin-I <0.015 0.000 - 0.045 ng/ml   D DIMER    Collection Time: 06/15/17  8:50 PM   Result Value Ref Range    D DIMER 0.42 0.01 - 0.50 ug/mL (FEU)           Diagnostic:     Xr Chest Sngl V    Result Date: 06/15/2017  Chest Indication: shortness of breath Comparison: Chest x-ray 01/09/2017 Findings: AP portable upright view of the chest demonstrates that the cardiac silhouette is not enlarged.  Stable left lower lobe round nodule. Stable bibasilar interstitial prominence.         Impression: No acute  cardiopulmonary disease.       Dragon medical dictation software was used for portions of this report.  Efforts have been made to edit the dictations, but occasionally words are mis-transcribed.        Knox Saliva, MD  06/15/2017  10:50 PM

## 2017-06-15 NOTE — ED Triage Notes (Signed)
ARRIVED IN RESPIRATORY DISTRESS. PER HUSBAND HAS BEEN GOING OFF AND ON ALL DAY.

## 2017-06-15 NOTE — ED Notes (Signed)
Verbal shift change report given to Dianna RN  (oncoming nurse) by Elnita Maxwellheryl RN  (offgoing nurs3e). Report included the following information SBAR.

## 2017-06-15 NOTE — ED Notes (Signed)
Pt. Continues receiving breathing treatment started by respiratory. Respirations improving.

## 2017-06-15 NOTE — ED Provider Notes (Signed)
[facility]  Emergency Department Treatment Report    Patient: [name] [name] Age: 67 y.o. Sex: female    Date of Birth: 1950-11-02 Admit Date: 06/15/2017 PCP: Lake Bells, MD   MRN: [id number]  CSN: [id number]  Attending: Minna Merritts, MD   Room: ER24/ER24 Time Dictated: 8:39 PM APP:  Minna Merritts, MD     Chief Complaint   Respiratory distress  History of Present Illness   67 y.o. female complaining of sudden onset of shortness of breath and midsternal chest tightness which started this afternoon.  She has a history of asthma and could not find her albuterol inhaler so has been brought to the ED.  She has had some wheezing and symptoms are similar to asthma exacerbations in the past except for the chest pain.  She describes the pain as a tightness and exacerbated with deep breath.  Denies radiating pain or alleviating features.  Denies associated nausea, vomiting or diaphoresis.  Denies fever/chills, cough or congestion.  Denies lower extremity swelling or pain.  Denies history of thromboembolic disease, recent long travel, recent surgery or use of hormones.    Review of Systems     Constitutional: No fever, chills  Eyes: No visual symptoms.  ENT: No sore throat, runny nose  Respiratory: Positive dyspnea and wheezing; no cough  Cardiovascular: Positive chest pain and pleuritic pain  Gastrointestinal: No vomiting, diarrhea or abdominal pain.  Genitourinary: No dysuria or  frequency  Musculoskeletal: No lower extremity swelling  Integumentary: No rashes.  Neurological: No headaches  Denies complaints in all other systems.    Past Medical/Surgical History     Past Medical History:   Diagnosis Date   ??? Arthritis    ??? Chronic pain    ??? Diabetes ([place])    ??? Emphysema lung ([place])    ??? GERD (gastroesophageal reflux disease)    ??? Hypertension    ??? Liver disease     HEP C   ??? PTSD (post-traumatic stress disorder)     lived through the [place] in 1993     Past Surgical History:    Procedure Laterality Date   ??? HX ENDOSCOPY     ??? HX GYN  2010    hysterectomy   ??? HX HIP REPLACEMENT Right 01/29/2015   ??? HX HIP REPLACEMENT         Social History     Social History     Socioeconomic History   ??? Marital status: MARRIED     Spouse name: Not on file   ??? Number of children: Not on file   ??? Years of education: Not on file   ??? Highest education level: Not on file   Tobacco Use   ??? Smoking status: Never Smoker   ??? Smokeless tobacco: Never Used   Substance and Sexual Activity   ??? Alcohol use: No   ??? Drug use: Yes     Types: Marijuana   ??? Sexual activity: No       Family History     Family History   Problem Relation Age of Onset   ??? Hypertension Mother    ??? Cancer Maternal Aunt    ??? Alcohol abuse Maternal Grandfather        Home Medications     Prior to Admission Medications   Prescriptions Last Dose Informant Patient Reported? Taking?   Blood-Glucose Meter monitoring kit   No No   Sig: Check sugars 2 times  per day, fasting and 2 hours after dinner   Lancets misc   No No   Sig: Check sugars twice daily   QUEtiapine SR (SEROQUEL XR) 150 mg sr tablet  Self No No   Sig: TAKE 1 TABLET BY MOUTH NIGHTLY   VENTOLIN HFA 90 mcg/actuation inhaler   No No   Sig: INHALE 2 PUFFS BY MOUTH EVERY 4 HOURS AS NEEDED FOR WHEEZING   amLODIPine (NORVASC) 10 mg tablet   No No   Sig: TAKE ONE TABLET BY MOUTH ONCE DAILY   benzonatate (TESSALON PERLES) 100 mg capsule   No No   Sig: Take 1 Cap by mouth three (3) times daily as needed for Cough.   glucose blood VI test strips (ASCENSIA AUTODISC VI, ONE TOUCH ULTRA TEST VI) strip   No No   Sig: Check sugars twice daily   losartan (COZAAR) 100 mg tablet   No No   Sig: Take 1 Tab by mouth daily. Indications: hypertension   magnesium oxide (MAG-OX) 400 mg tablet   No No   Sig: TAKE 1 TABLET BY MOUTH DAILY   metFORMIN ER (GLUCOPHAGE XR) 750 mg tablet   No No   Sig: TAKE 2 TABLETS EVERY DAY   montelukast (SINGULAIR) 10 mg tablet   No No   Sig: TAKE 1 TABLET BY MOUTH DAILY    nabumetone (RELAFEN) 750 mg tablet   No No   Sig: TAKE 1 TABLET BY MOUTH TWICE DAILY AS NEEDED FOR PAIN   oxyCODONE-acetaminophen (PERCOCET 10) 10-325 mg per tablet   No No   Sig: Take 1 Tab by mouth two (2) times daily as needed for Pain. Max Daily Amount: 2 Tabs.   polyethylene glycol (MIRALAX) 17 gram packet   No No   Sig: Take 1 Packet by mouth daily.   predniSONE (DELTASONE) 20 mg tablet   No No   Sig: Take 3 tabs daily for 3 days, 2 tabs daily for 3 days, 1 tab daily for 3 days, then 1/2 tablet daily for 3 days   rosuvastatin (CRESTOR) 5 mg tablet  Self No No   Sig: TAKE 1 TABLET BY MOUTH NIGHTLY   spironolactone (ALDACTONE) 100 mg tablet   No No   Sig: TAKE 1 TABLET BY MOUTH DAILY   traZODone (DESYREL) 100 mg tablet  Self No No   Sig: TAKE 1 TABLET BY MOUTH NIGHTLY   Patient not taking: Reported on 02/22/2017   triamcinolone (NASACORT) 55 mcg nasal inhaler   No No   Sig: 2 Sprays by Both Nostrils route daily.      Facility-Administered Medications: None       Allergies     Allergies   Allergen Reactions   ??? Chocolate [Cocoa] Sneezing       Physical Exam     ED Triage Vitals   ED Encounter Vitals Group      BP 06/15/17 1940 (!) 153/123      Pulse (Heart Rate) 06/15/17 1927 (!) 127      Resp Rate 06/15/17 1927 27      Temp --       Temp src --       O2 Sat (%) 06/15/17 1920 100 %      Weight 06/15/17 1927 180 lb      Height 06/15/17 1927 _0        Constitutional: She appears in respiratory distress, she is alert  HEENT: Conjunctiva clear. PERRLA. Mucous membranes moist, non-erythematous. Surface  of the pharynx, palate, and tongue are pink, moist and without lesions.  Neck: supple, non tender, symmetrical, no masses or JVD. No lymphadenopathy.  Respiratory: Diminished breath sounds throughout with labored respirations and tachypnea noted  Cardiovascular: Heart rate is tachycardic without murmur rubs or gallops.    Gastrointestinal:  Abdomen soft, nondistended, nontender without complaint  of pain to palpation, bowel sounds present, no CVA tenderness noted bilaterally  Musculoskeletal: Calves soft and non-tender. Distal pulses 2+ and equal bilaterally.  No peripheral edema or significant variscosities.   Integumentary: warm and dry without rashes or lesions  Neurologic: alert and oriented. No facial asymmetry or dysarthria. Moving all extremities well    Impression and Management Plan   67 y.o. female complaining of shortness of breath and pleuritic chest pain which started this afternoon.  She feels like symptoms are similar to asthma exacerbation in the past.  She appears in respiratory distress with diminished breath sounds throughout lung fields.  We will give the patient a DuoNeb treatment, IV Solu-Medrol.  We will obtain EKG, chest x-ray and appropriate labs.  Given her acute onset of shortness of breath and pleuritic pain we will also obtain a d-dimer.    Diagnostic Studies   Lab:   Recent Results (from the past 12 hour(s))   CBC WITH AUTOMATED DIFF    Collection Time: 06/15/17  8:50 PM   Result Value Ref Range    WBC 17.8 (H) 4.0 - 11.0 1000/mm3    RBC 4.40 3.60 - 5.20 M/uL    HGB 12.9 (L) 13.0 - 17.2 gm/dl    HCT 38.1 37.0 - 50.0 %    MCV 86.6 80.0 - 98.0 fL    MCH 29.3 25.4 - 34.6 pg    MCHC 33.9 30.0 - 36.0 gm/dl    PLATELET 266 140 - 450 1000/mm3    MPV 10.9 (H) 6.0 - 10.0 fL    RDW-SD 40.9 36.4 - 46.3      NRBC 0 0 - 0      IMMATURE GRANULOCYTES 0.5 0.0 - 3.0 %    NEUTROPHILS 76.3 (H) 34 - 64 %    LYMPHOCYTES 17.6 (L) 28 - 48 %    MONOCYTES 5.2 1 - 13 %    EOSINOPHILS 0.2 0 - 5 %    BASOPHILS 0.2 0 - 3 %   METABOLIC PANEL, BASIC    Collection Time: 06/15/17  8:50 PM   Result Value Ref Range    Sodium 134 (L) 136 - 145 mEq/L    Potassium 3.7 3.5 - 5.1 mEq/L    Chloride 100 98 - 107 mEq/L    CO2 27 21 - 32 mEq/L    Glucose 218 (H) 74 - 106 mg/dl    BUN 20 7 - 25 mg/dl    Creatinine 1.1 0.6 - 1.3 mg/dl    GFR est AA >60.0      GFR est non-AA 53      Calcium 9.1 8.5 - 10.1 mg/dl     Anion gap 7 5 - 15 mmol/L   TROPONIN I    Collection Time: 06/15/17  8:50 PM   Result Value Ref Range    Troponin-I <0.015 0.000 - 0.045 ng/ml   D DIMER    Collection Time: 06/15/17  8:50 PM   Result Value Ref Range    D DIMER 0.42 0.01 - 0.50 ug/mL (FEU)       Imaging:    Xr Chest Sngl V  Result Date: 06/15/2017  Chest Indication: shortness of breath Comparison: Chest x-ray 01/09/2017 Findings: AP portable upright view of the chest demonstrates that the cardiac silhouette is not enlarged.  Stable left lower lobe round nodule. Stable bibasilar interstitial prominence.        Impression: No acute cardiopulmonary disease.     EKG:  Sinus tachycardia; no ST-T changes suggestive of acute ischemia (as read by Dr. Sandi Mealy)    ED Course/Medical Decision Making   Continuation by Dr. Shirline Frees:  Patient seen and examined by me. 67 y.o. female presents to ED with dyspnea and chest pain.  Has a history of asthma, DM, HTN, and high cholesterol.  No known history of CAD.  Chest pain in right chest.  Started abruptly this afternoon.  Initial cardiac work-up unremarkable.  Patient tachycardic on my exam. D-dimer wnl. CXR negative.  Will continue nebs for possible asthma exacerbation, although I do not appreciate any significant wheezing earlier (patient reported significant wheezing earlier). Patient reports some improvement with nebs here (could not find her inhaler at home).  Will admit for further cardiac work-up, as she reports she hasn't had a recent stress test.  Patient admitted in stable condition.     Medications   sodium chloride (NS) flush 5-10 mL (not administered)   albuterol (PROVENTIL VENTOLIN) 2.5 mg /3 mL (0.083 %) nebulizer solution (5 mg  Given 06/15/17 1952)   albuterol-ipratropium (DUO-NEB) 2.5 MG-0.5 MG/3 ML (3 mL Nebulization Given 06/15/17 1952)   albuterol (PROVENTIL VENTOLIN) nebulizer solution 2.5 mg (2.5 mg Nebulization Given 06/15/17 1952)    methylPREDNISolone (PF) (Solu-MEDROL) injection 125 mg (125 mg IntraVENous Given 06/15/17 2048)   acetaminophen (TYLENOL) tablet 975 mg (975 mg Oral Given 06/15/17 2126)   albuterol (PROVENTIL VENTOLIN) nebulizer solution 2.5 mg (2.5 mg Nebulization Given 06/15/17 2222)   albuterol (PROVENTIL VENTOLIN) 2.5 mg /3 mL (0.083 %) nebulizer solution (2.5 mg  Given 06/15/17 2241)   albuterol-ipratropium (DUO-NEB) 2.5 mg-0.5 mg/3 ml nebulizer solution (3 mL  Given 06/15/17 2241)     Final Diagnosis       ICD-10-CM ICD-9-CM   1. Chest pain, unspecified type R07.9 786.50   2. Dyspnea, unspecified type R06.00 786.09   3. Mild asthma without complication, unspecified whether persistent J45.909 493.90         Disposition   Patient admitted in stable condition.       Current Discharge Medication List        The patient was fully discussed with [facility], Norvell Caswell A, MD who agrees with the above assessment and plan      Ronette Deter PA-C  June 15, 2017    My signature above authenticates this document and my orders, the final ??  diagnosis (es), discharge prescription (s), and instructions in the Epic ??  record.  If you have any questions please contact [phone].  ??  Nursing notes have been reviewed by the physician/ advanced practice ??  Clinician.

## 2017-06-16 ENCOUNTER — Inpatient Hospital Stay
Admit: 2017-06-16 | Discharge: 2017-06-17 | Disposition: A | Discharge status: Home / Self Care | Payer: MEDICARE | Attending: Emergency Medicine

## 2017-06-16 LAB — EKG 12-LEAD
Atrial Rate: 112 {beats}/min
P Axis: 58 degrees
P-R Interval: 158 ms
Q-T Interval: 320 ms
QRS Duration: 74 ms
QTc Calculation (Bazett): 436 ms
R Axis: 44 degrees
T Axis: 68 degrees
Ventricular Rate: 112 {beats}/min

## 2017-06-16 LAB — D-DIMER, QUANTITATIVE: D-Dimer, Quant: 0.42 ug/mL (FEU) (ref 0.01–0.50)

## 2017-06-16 LAB — CBC WITH AUTOMATED DIFF
BASOPHILS: 0.2 % (ref 0–3)
BASOPHILS: 0.1 % (ref 0–3)
EOSINOPHILS: 0.2 % (ref 0–5)
EOSINOPHILS: 0.1 % (ref 0–5)
HCT: 38.1 % (ref 37.0–50.0)
HCT: 38 % (ref 37.0–50.0)
HGB: 12.9 gm/dl — ABNORMAL LOW (ref 13.0–17.2)
HGB: 12.9 gm/dl — ABNORMAL LOW (ref 13.0–17.2)
IMMATURE GRANULOCYTES: 0.5 % (ref 0.0–3.0)
IMMATURE GRANULOCYTES: 0.6 % (ref 0.0–3.0)
LYMPHOCYTES: 17.6 % — ABNORMAL LOW (ref 28–48)
LYMPHOCYTES: 5.1 % — ABNORMAL LOW (ref 28–48)
MCH: 29.3 pg (ref 25.4–34.6)
MCH: 29.9 pg (ref 25.4–34.6)
MCHC: 33.9 gm/dl (ref 30.0–36.0)
MCHC: 33.9 gm/dl (ref 30.0–36.0)
MCV: 86.6 fL (ref 80.0–98.0)
MCV: 88.2 fL (ref 80.0–98.0)
MONOCYTES: 5.2 % (ref 1–13)
MONOCYTES: 0.7 % — ABNORMAL LOW (ref 1–13)
MPV: 10.9 fL — ABNORMAL HIGH (ref 6.0–10.0)
MPV: 11 fL — ABNORMAL HIGH (ref 6.0–10.0)
NEUTROPHILS: 76.3 % — ABNORMAL HIGH (ref 34–64)
NEUTROPHILS: 93.4 % — ABNORMAL HIGH (ref 34–64)
NRBC: 0 (ref 0–0)
NRBC: 0 (ref 0–0)
PLATELET: 266 10*3/uL (ref 140–450)
PLATELET: 286 10*3/uL (ref 140–450)
RBC: 4.4 M/uL (ref 3.60–5.20)
RBC: 4.31 M/uL (ref 3.60–5.20)
RDW-SD: 40.9 (ref 36.4–46.3)
RDW-SD: 42.6 (ref 36.4–46.3)
WBC: 17.8 10*3/uL — ABNORMAL HIGH (ref 4.0–11.0)
WBC: 16.4 10*3/uL — ABNORMAL HIGH (ref 4.0–11.0)

## 2017-06-16 LAB — METABOLIC PANEL, BASIC
Anion gap: 7 mmol/L (ref 5–15)
Anion gap: 15 mmol/L (ref 5–15)
BUN: 20 mg/dl (ref 7–25)
BUN: 22 mg/dl (ref 7–25)
CO2: 27 mEq/L (ref 21–32)
CO2: 20 mEq/L — ABNORMAL LOW (ref 21–32)
Calcium: 9.1 mg/dl (ref 8.5–10.1)
Calcium: 9.7 mg/dl (ref 8.5–10.1)
Chloride: 100 mEq/L (ref 98–107)
Chloride: 98 mEq/L (ref 98–107)
Creatinine: 1.1 mg/dl (ref 0.6–1.3)
Creatinine: 1.5 mg/dl — ABNORMAL HIGH (ref 0.6–1.3)
GFR est AA: >60
GFR est AA: 45
GFR est non-AA: 53
GFR est non-AA: 37
Glucose: 218 mg/dl — ABNORMAL HIGH (ref 74–106)
Glucose: 350 mg/dl — ABNORMAL HIGH (ref 74–106)
Potassium: 3.7 mEq/L (ref 3.5–5.1)
Potassium: 4.7 mEq/L (ref 3.5–5.1)
Sodium: 134 mEq/L — ABNORMAL LOW (ref 136–145)
Sodium: 133 mEq/L — ABNORMAL LOW (ref 136–145)

## 2017-06-16 LAB — EKG, 12 LEAD, INITIAL
Atrial Rate: 112 {beats}/min
Calculated P Axis: 58 degrees
Calculated R Axis: 44 degrees
Calculated T Axis: 68 degrees
P-R Interval: 158 ms
Q-T Interval: 320 ms
QRS Duration: 74 ms
QTC Calculation (Bezet): 436 ms
Ventricular Rate: 112 {beats}/min

## 2017-06-16 LAB — POC URINE MACROSCOPIC
Bilirubin: NEGATIVE
Glucose: >=1000 mg/dl — AB
Leukocyte Esterase: NEGATIVE
Nitrites: POSITIVE — AB
Protein: 100 mg/dl — AB
Specific gravity: 1.02 (ref 1.005–1.030)
Urobilinogen: 0.2 EU/dl (ref 0.0–1.0)
pH (UA): 5.5 (ref 5–9)

## 2017-06-16 LAB — TSH 3RD GENERATION: TSH: 0.276 u[IU]/mL — ABNORMAL LOW (ref 0.358–3.740)

## 2017-06-16 LAB — GLUCOSE, POC
Glucose (POC): 293 mg/dL — ABNORMAL HIGH (ref 65–105)
Glucose (POC): 192 mg/dL — ABNORMAL HIGH (ref 65–105)
Glucose (POC): 221 mg/dL — ABNORMAL HIGH (ref 65–105)

## 2017-06-16 LAB — LIPID PANEL
CHOL/HDL Ratio: 2.1 Ratio (ref 0.0–4.4)
Cholesterol, total: 130 mg/dl — ABNORMAL LOW (ref 140–199)
HDL Cholesterol: 63 mg/dl (ref 40–96)
LDL, calculated: 55 mg/dl (ref 0–130)
Triglyceride: 61 mg/dl (ref 29–150)

## 2017-06-16 LAB — NT-PRO BNP: NT pro-BNP: 63 pg/ml (ref 0.0–125.0)

## 2017-06-16 LAB — D DIMER: D DIMER: 0.42 ug/mL (FEU) (ref 0.01–0.50)

## 2017-06-16 LAB — PROTHROMBIN TIME + INR
INR: 1.1 (ref 0.1–1.1)
Prothrombin time: 12.8 seconds (ref 10.2–12.9)

## 2017-06-16 LAB — TROPONIN I
Troponin-I: <0.015 ng/ml (ref 0.000–0.045)
Troponin-I: <0.015 ng/ml (ref 0.000–0.045)

## 2017-06-16 MED ORDER — SODIUM CHLORIDE 0.9 % IJ SYRG
INTRAMUSCULAR | Status: DC | PRN
Start: 2017-06-16 — End: 2017-06-18

## 2017-06-16 MED ORDER — NALOXONE 0.4 MG/ML INJECTION
0.4 mg/mL | INTRAMUSCULAR | Status: DC | PRN
Start: 2017-06-16 — End: 2017-06-18

## 2017-06-16 MED ORDER — ROSUVASTATIN 5 MG TAB
5 mg | Freq: Every evening | ORAL | Status: DC
Start: 2017-06-16 — End: 2017-06-18
  Administered 2017-06-17 – 2017-06-18 (×3): via ORAL

## 2017-06-16 MED ORDER — OXYCODONE-ACETAMINOPHEN 5 MG-325 MG TAB
5-325 mg | ORAL | Status: DC | PRN
Start: 2017-06-16 — End: 2017-06-16
  Administered 2017-06-16: 13:00:00 via ORAL

## 2017-06-16 MED ORDER — INSULIN LISPRO 100 UNIT/ML INJECTION
100 unit/mL | SUBCUTANEOUS | Status: DC | PRN
Start: 2017-06-16 — End: 2017-06-18
  Administered 2017-06-18: 15:00:00 via SUBCUTANEOUS

## 2017-06-16 MED ORDER — GUAIFENESIN 600 MG TABLET,EXTENDED RELEASE BIPHASIC 12 HR
600 mg | Freq: Two times a day (BID) | ORAL | Status: DC
Start: 2017-06-16 — End: 2017-06-18
  Administered 2017-06-16 – 2017-06-18 (×8): via ORAL

## 2017-06-16 MED ORDER — ALBUTEROL SULFATE 0.083 % (0.83 MG/ML) SOLN FOR INHALATION
2.5 mg /3 mL (0.083 %) | RESPIRATORY_TRACT | Status: DC | PRN
Start: 2017-06-16 — End: 2017-06-16
  Administered 2017-06-17: 01:00:00 via RESPIRATORY_TRACT

## 2017-06-16 MED ORDER — ENOXAPARIN 40 MG/0.4 ML SUB-Q SYRINGE
40 mg/0.4 mL | SUBCUTANEOUS | Status: DC
Start: 2017-06-16 — End: 2017-06-18
  Administered 2017-06-16 – 2017-06-18 (×3): via SUBCUTANEOUS

## 2017-06-16 MED ORDER — SODIUM CHLORIDE 0.9 % IJ SYRG
Freq: Once | INTRAMUSCULAR | Status: AC
Start: 2017-06-16 — End: 2017-06-16
  Administered 2017-06-16: 06:00:00 via INTRAVENOUS

## 2017-06-16 MED ORDER — ACETAMINOPHEN 325 MG TABLET
325 mg | ORAL | Status: DC | PRN
Start: 2017-06-16 — End: 2017-06-17
  Administered 2017-06-17: 01:00:00 via ORAL

## 2017-06-16 MED ORDER — SODIUM CHLORIDE 0.9 % IJ SYRG
Freq: Three times a day (TID) | INTRAMUSCULAR | Status: DC
Start: 2017-06-16 — End: 2017-06-18
  Administered 2017-06-16 – 2017-06-18 (×8): via INTRAVENOUS

## 2017-06-16 MED ORDER — METHYLPREDNISOLONE (PF) 125 MG/2 ML IJ SOLR
125 mg/2 mL | Freq: Once | INTRAMUSCULAR | Status: AC
Start: 2017-06-16 — End: 2017-06-15
  Administered 2017-06-16: 02:00:00 via INTRAVENOUS

## 2017-06-16 MED ORDER — LORATADINE 10 MG TAB
10 mg | Freq: Every day | ORAL | Status: DC
Start: 2017-06-16 — End: 2017-06-18
  Administered 2017-06-16 – 2017-06-18 (×5): via ORAL

## 2017-06-16 MED ORDER — METHYLPREDNISOLONE (PF) 40 MG/ML IJ SOLR
40 mg/mL | Freq: Three times a day (TID) | INTRAMUSCULAR | Status: DC
Start: 2017-06-16 — End: 2017-06-16
  Administered 2017-06-16 (×2): via INTRAVENOUS

## 2017-06-16 MED ORDER — MAGNESIUM OXIDE 400 MG TAB
400 mg | Freq: Every day | ORAL | Status: DC
Start: 2017-06-16 — End: 2017-06-18
  Administered 2017-06-16 – 2017-06-18 (×5): via ORAL

## 2017-06-16 MED ORDER — PREDNISONE 20 MG TAB
20 mg | Freq: Every day | ORAL | Status: DC
Start: 2017-06-16 — End: 2017-06-17
  Administered 2017-06-17: 14:00:00 via ORAL

## 2017-06-16 MED ORDER — QUETIAPINE SR 50 MG 24 HR TAB
50 mg | Freq: Every evening | ORAL | Status: DC
Start: 2017-06-16 — End: 2017-06-18
  Administered 2017-06-17 – 2017-06-18 (×3): via ORAL

## 2017-06-16 MED ORDER — TRAZODONE 50 MG TAB
50 mg | Freq: Every evening | ORAL | Status: DC
Start: 2017-06-16 — End: 2017-06-18
  Administered 2017-06-17 – 2017-06-18 (×3): via ORAL

## 2017-06-16 MED ORDER — ALBUTEROL SULFATE 0.083 % (0.83 MG/ML) SOLN FOR INHALATION
2.5 mg /3 mL (0.083 %) | RESPIRATORY_TRACT | Status: AC
Start: 2017-06-16 — End: 2017-06-15
  Administered 2017-06-16: 01:00:00 via RESPIRATORY_TRACT

## 2017-06-16 MED ORDER — ACETAMINOPHEN 325 MG TABLET
325 mg | ORAL | Status: AC
Start: 2017-06-16 — End: 2017-06-15
  Administered 2017-06-16: 02:00:00 via ORAL

## 2017-06-16 MED ORDER — ALBUTEROL SULFATE 0.083 % (0.83 MG/ML) SOLN FOR INHALATION
2.5 mg /3 mL (0.083 %) | RESPIRATORY_TRACT | Status: AC
Start: 2017-06-16 — End: 2017-06-16
  Administered 2017-06-16: 07:00:00

## 2017-06-16 MED ORDER — GLUCAGON 1 MG INJECTION
1 mg | INTRAMUSCULAR | Status: DC | PRN
Start: 2017-06-16 — End: 2017-06-18

## 2017-06-16 MED ORDER — ALBUTEROL SULFATE 0.083 % (0.83 MG/ML) SOLN FOR INHALATION
2.5 mg /3 mL (0.083 %) | RESPIRATORY_TRACT | Status: AC
Start: 2017-06-16 — End: 2017-06-15
  Administered 2017-06-16: 03:00:00 via RESPIRATORY_TRACT

## 2017-06-16 MED ORDER — ZOLPIDEM 5 MG TAB
5 mg | Freq: Every evening | ORAL | Status: DC | PRN
Start: 2017-06-16 — End: 2017-06-18
  Administered 2017-06-17 – 2017-06-18 (×2): via ORAL

## 2017-06-16 MED ORDER — IPRATROPIUM-ALBUTEROL 2.5 MG-0.5 MG/3 ML NEB SOLUTION
2.5 mg-0.5 mg/3 ml | RESPIRATORY_TRACT | Status: AC
Start: 2017-06-16 — End: 2017-06-15
  Administered 2017-06-16: 01:00:00 via RESPIRATORY_TRACT

## 2017-06-16 MED ORDER — AMLODIPINE 5 MG TAB
5 mg | Freq: Once | ORAL | Status: AC
Start: 2017-06-16 — End: 2017-06-16
  Administered 2017-06-16: 18:00:00 via ORAL

## 2017-06-16 MED ORDER — IPRATROPIUM-ALBUTEROL 2.5 MG-0.5 MG/3 ML NEB SOLUTION
2.5 mg-0.5 mg/3 ml | RESPIRATORY_TRACT | Status: AC
Start: 2017-06-16 — End: 2017-06-15
  Administered 2017-06-16: 04:00:00

## 2017-06-16 MED ORDER — ALBUTEROL SULFATE 0.083 % (0.83 MG/ML) SOLN FOR INHALATION
2.5 mg /3 mL (0.083 %) | RESPIRATORY_TRACT | Status: AC
Start: 2017-06-16 — End: 2017-06-15
  Administered 2017-06-16: 01:00:00

## 2017-06-16 MED ORDER — OXYCODONE-ACETAMINOPHEN 10 MG-325 MG TAB
10-325 mg | Freq: Two times a day (BID) | ORAL | Status: DC | PRN
Start: 2017-06-16 — End: 2017-06-18
  Administered 2017-06-16 – 2017-06-18 (×3): via ORAL

## 2017-06-16 MED ORDER — INSULIN GLARGINE 100 UNIT/ML INJECTION
100 unit/mL | Freq: Every evening | SUBCUTANEOUS | Status: DC
Start: 2017-06-16 — End: 2017-06-18
  Administered 2017-06-16 – 2017-06-18 (×3): via SUBCUTANEOUS

## 2017-06-16 MED ORDER — INSULIN LISPRO 100 UNIT/ML INJECTION
100 unit/mL | Freq: Four times a day (QID) | SUBCUTANEOUS | Status: DC
Start: 2017-06-16 — End: 2017-06-18
  Administered 2017-06-16 – 2017-06-18 (×9): via SUBCUTANEOUS

## 2017-06-16 MED ORDER — MONTELUKAST 10 MG TAB
10 mg | Freq: Every evening | ORAL | Status: DC
Start: 2017-06-16 — End: 2017-06-18
  Administered 2017-06-17 – 2017-06-18 (×3): via ORAL

## 2017-06-16 MED ORDER — BUTALBITAL-ACETAMINOPHEN-CAFFEINE 50 MG-325 MG-40 MG TAB
50-325-40 mg | Freq: Four times a day (QID) | ORAL | Status: DC | PRN
Start: 2017-06-16 — End: 2017-06-18
  Administered 2017-06-16 – 2017-06-17 (×4): via ORAL

## 2017-06-16 MED ORDER — SPIRONOLACTONE 100 MG TAB
100 mg | Freq: Every day | ORAL | Status: DC
Start: 2017-06-16 — End: 2017-06-18
  Administered 2017-06-16 – 2017-06-18 (×5): via ORAL

## 2017-06-16 MED ORDER — IPRATROPIUM-ALBUTEROL 2.5 MG-0.5 MG/3 ML NEB SOLUTION
2.5 mg-0.5 mg/3 ml | RESPIRATORY_TRACT | Status: AC
Start: 2017-06-16 — End: 2017-06-15

## 2017-06-16 MED ORDER — ALBUTEROL SULFATE 0.083 % (0.83 MG/ML) SOLN FOR INHALATION
2.5 mg /3 mL (0.083 %) | RESPIRATORY_TRACT | Status: AC
Start: 2017-06-16 — End: 2017-06-15

## 2017-06-16 MED ORDER — IPRATROPIUM-ALBUTEROL 2.5 MG-0.5 MG/3 ML NEB SOLUTION
2.5 mg-0.5 mg/3 ml | RESPIRATORY_TRACT | Status: DC
Start: 2017-06-16 — End: 2017-06-17
  Administered 2017-06-16 – 2017-06-17 (×4): via RESPIRATORY_TRACT

## 2017-06-16 MED ORDER — AMLODIPINE 5 MG TAB
5 mg | Freq: Every day | ORAL | Status: DC
Start: 2017-06-16 — End: 2017-06-18
  Administered 2017-06-17 – 2017-06-18 (×4): via ORAL

## 2017-06-16 MED ORDER — ALBUTEROL SULFATE 0.083 % (0.83 MG/ML) SOLN FOR INHALATION
2.5 mg /3 mL (0.083 %) | RESPIRATORY_TRACT | Status: AC
Start: 2017-06-16 — End: 2017-06-15
  Administered 2017-06-16: 04:00:00

## 2017-06-16 MED ORDER — DEXTROSE 50% IN WATER (D50W) IV SYRG
INTRAVENOUS | Status: DC | PRN
Start: 2017-06-16 — End: 2017-06-18

## 2017-06-16 MED ORDER — SPIRONOLACTONE 25 MG TAB
25 mg | Freq: Every day | ORAL | Status: DC
Start: 2017-06-16 — End: 2017-06-16

## 2017-06-16 MED ORDER — BUDESONIDE 0.5 MG/2 ML NEB SUSPENSION
0.5 mg/2 mL | Freq: Two times a day (BID) | RESPIRATORY_TRACT | Status: DC
Start: 2017-06-16 — End: 2017-06-18
  Administered 2017-06-16 – 2017-06-18 (×5): via RESPIRATORY_TRACT

## 2017-06-16 MED ORDER — IBUPROFEN 400 MG TAB
400 mg | ORAL | Status: AC
Start: 2017-06-16 — End: 2017-06-16
  Administered 2017-06-16: 06:00:00 via ORAL

## 2017-06-16 MED ORDER — MONTELUKAST 10 MG TAB
10 mg | Freq: Every evening | ORAL | Status: DC
Start: 2017-06-16 — End: 2017-06-16

## 2017-06-16 MED ORDER — IPRATROPIUM-ALBUTEROL 2.5 MG-0.5 MG/3 ML NEB SOLUTION
2.5 mg-0.5 mg/3 ml | Freq: Four times a day (QID) | RESPIRATORY_TRACT | Status: DC | PRN
Start: 2017-06-16 — End: 2017-06-16

## 2017-06-16 MED ORDER — AMLODIPINE 5 MG TAB
5 mg | Freq: Every day | ORAL | Status: DC
Start: 2017-06-16 — End: 2017-06-16

## 2017-06-16 MED ORDER — IPRATROPIUM-ALBUTEROL 2.5 MG-0.5 MG/3 ML NEB SOLUTION
2.5 mg-0.5 mg/3 ml | RESPIRATORY_TRACT | Status: AC
Start: 2017-06-16 — End: 2017-06-16
  Administered 2017-06-16: 07:00:00

## 2017-06-16 MED FILL — LORATADINE 10 MG TAB: 10 mg | ORAL | Qty: 1

## 2017-06-16 MED FILL — IPRATROPIUM-ALBUTEROL 2.5 MG-0.5 MG/3 ML NEB SOLUTION: 2.5 mg-0.5 mg/3 ml | RESPIRATORY_TRACT | Qty: 3

## 2017-06-16 MED FILL — OXYCODONE-ACETAMINOPHEN 5 MG-325 MG TAB: 5-325 mg | ORAL | Qty: 1

## 2017-06-16 MED FILL — SOLU-MEDROL (PF) 40 MG/ML SOLUTION FOR INJECTION: 40 mg/mL | INTRAMUSCULAR | Qty: 1

## 2017-06-16 MED FILL — TRAZODONE 50 MG TAB: 50 mg | ORAL | Qty: 2

## 2017-06-16 MED FILL — BD POSIFLUSH NORMAL SALINE 0.9 % INJECTION SYRINGE: INTRAMUSCULAR | Qty: 20

## 2017-06-16 MED FILL — ALBUTEROL SULFATE 0.083 % (0.83 MG/ML) SOLN FOR INHALATION: 2.5 mg /3 mL (0.083 %) | RESPIRATORY_TRACT | Qty: 1

## 2017-06-16 MED FILL — BUTALBITAL-ACETAMINOPHEN-CAFFEINE 50 MG-325 MG-40 MG TAB: 50-325-40 mg | ORAL | Qty: 1

## 2017-06-16 MED FILL — MAGNESIUM OXIDE 400 MG TAB: 400 mg | ORAL | Qty: 1

## 2017-06-16 MED FILL — SPIRONOLACTONE 25 MG TAB: 25 mg | ORAL | Qty: 4

## 2017-06-16 MED FILL — SPIRONOLACTONE 100 MG TAB: 100 mg | ORAL | Qty: 1

## 2017-06-16 MED FILL — BUDESONIDE 0.5 MG/2 ML NEB SUSPENSION: 0.5 mg/2 mL | RESPIRATORY_TRACT | Qty: 1

## 2017-06-16 MED FILL — ROSUVASTATIN 5 MG TAB: 5 mg | ORAL | Qty: 1

## 2017-06-16 MED FILL — ACETAMINOPHEN 325 MG TABLET: 325 mg | ORAL | Qty: 3

## 2017-06-16 MED FILL — AMLODIPINE 5 MG TAB: 5 mg | ORAL | Qty: 1

## 2017-06-16 MED FILL — SOLU-MEDROL (PF) 125 MG/2 ML SOLUTION FOR INJECTION: 125 mg/2 mL | INTRAMUSCULAR | Qty: 2

## 2017-06-16 MED FILL — MUCINEX 600 MG TABLET, EXTENDED RELEASE: 600 mg | ORAL | Qty: 1

## 2017-06-16 MED FILL — OXYCODONE-ACETAMINOPHEN 10 MG-325 MG TAB: 10-325 mg | ORAL | Qty: 1

## 2017-06-16 MED FILL — SEROQUEL XR 50 MG TABLET,EXTENDED RELEASE: 50 mg | ORAL | Qty: 3

## 2017-06-16 MED FILL — IBUPROFEN 400 MG TAB: 400 mg | ORAL | Qty: 2

## 2017-06-16 MED FILL — LOVENOX 40 MG/0.4 ML SUBCUTANEOUS SYRINGE: 40 mg/0.4 mL | SUBCUTANEOUS | Qty: 0.4

## 2017-06-16 NOTE — ED Notes (Signed)
motirn is given for c/o hip pain from a surgery that accured 2 yrs ago. She is resting at this time pending room placement

## 2017-06-16 NOTE — ED Notes (Signed)
Bedside shift change report given to Britne Borelli RN (oncoming nurse) by Katina RN (offgoing nurse). Report included the following information SBAR.

## 2017-06-16 NOTE — ED Notes (Signed)
Pt resting pending admisson at this time resp to do treatment she has no comlpants and vss remains on monitor no distess

## 2017-06-16 NOTE — ED Notes (Signed)
Pt states headache states hip pain from surgery 2 years ago states she takes some med begins with a n, pt given tylenol given motrin moved to a comfortable bed. Report to obs pt to transfer to obs l

## 2017-06-16 NOTE — ED Notes (Signed)
Bedside shift change report given to Myleen RN (oncoming nurse) by Katina RN (offgoing nurse). Report included the following information SBAR, Kardex, ED Summary, Procedure Summary, MAR and Recent Results.

## 2017-06-16 NOTE — ED Notes (Signed)
TRANSFER - OUT REPORT:    Verbal report given to [facility] (name[facility] on [name] [name]  being transferred to 2223(unit) for routine progression of care       Report consisted of patient???s Situation, Background, Assessment and   Recommendations(SBAR).     Information from the following report(s) SBAR, Kardex, ED Summary, MAR, Recent Results and Cardiac Rhythm sinus tach was reviewed with the receiving nurse.    Lines:   Peripheral IV 06/15/17 Left Forearm (Active)        Opportunity for questions and clarification was provided.      Patient transported with:   [employer]

## 2017-06-16 NOTE — ED Notes (Signed)
Pt is placed on a hospital bed for her comfort resting at this time on monitor in no distress

## 2017-06-16 NOTE — Progress Notes (Signed)
Hospitalist Progress Note  Kristen Loader, MD  [facility] Hospitalists    Daily Progress Note: 06/16/2017    Assessment/Plan:     Acute Asthma/COPD Exacerbation  HTN  DM2  Hep C Positive  Atypical chest pain    Plan    Continue Nebs, singulair, claritin.  D/C Solumedrol and switch to PO prednisone.  Treating HA with Fioricet and APAP.  Pt with CP on admission likely related to coughing and Asthma/COPD exacerbation.  D/C tele and plans for Echo  Glucomander    DVT Prophylaxis.  Lovenox SQ    Code status. Full code.    Disposition.  Likely home tomorrow.   Lungs are much more clear than earlier reported.    ------------------    36 minutes were spent on the patient of which more than 50% was spent in coordination of care and counseling (time spent with patient/family face to face, physical exam, reviewing laboratory and imaging investigations, speaking with physicians and nursing staff involved in this patient's care)    ------------------  Physical exam:  General:  Alert, NAD, pleasant.  Cooperative  HEENT: Sclera anicteric, PERRL, OM moist, throat clear.  Chest: minimal wheeze, no rales, no rhonchi.  Fair air movement.  CV: RRR, S1/S2 were normal, no murmur  Abdomen: NTND, soft, NABS, no masses were noted.  No rebound, no guarding.  Extremities: No edema.    Subjective:   Pt said her breathing is better. No CP currently, no F/C. She said he main issue right now is her HA. We discussed med adjustments for the HA and plans for asthma/steroids      Objective:     Visit Vitals  BP (!) 158/115 (BP Patient Position: Supine)   Pulse (!) 114   Temp 98.8 ??F (37.1 ??C)   Resp 18   Ht 5' 8"  (1.727 m)   Wt 81.6 kg (180 lb)   SpO2 100%   BMI 27.37 kg/m??    O2 Flow Rate (L/min): 3 l/min O2 Device: Nasal cannula    Temp (24hrs), Avg:98.4 ??F (36.9 ??C), Min:98.1 ??F (36.7 ??C), Max:98.8 ??F (37.1 ??C)    No intake/output data recorded.   No intake/output data recorded.      Data Review:       Recent Days:  Recent Labs      06/16/17  0620 06/15/17  2050   WBC 16.4* 17.8*   HGB 12.9* 12.9*   HCT 38.0 38.1   PLT 286 266     Recent Labs     06/16/17  0620 06/15/17  2050   NA 133* 134*   K 4.7 3.7   CL 98 100   CO2 20* 27   GLU 350* 218*   BUN 22 20   CREA 1.5* 1.1   CA 9.7 9.1   INR 1.1  --      No results for input(s): PH, PCO2, PO2, HCO3, FIO2 in the last 72 hours.    24 Hour Results:  Recent Results (from the past 24 hour(s))   CBC WITH AUTOMATED DIFF    Collection Time: 06/15/17  8:50 PM   Result Value Ref Range    WBC 17.8 (H) 4.0 - 11.0 1000/mm3    RBC 4.40 3.60 - 5.20 M/uL    HGB 12.9 (L) 13.0 - 17.2 gm/dl    HCT 38.1 37.0 - 50.0 %    MCV 86.6 80.0 - 98.0 fL    MCH 29.3 25.4 - 34.6 pg  MCHC 33.9 30.0 - 36.0 gm/dl    PLATELET 266 140 - 450 1000/mm3    MPV 10.9 (H) 6.0 - 10.0 fL    RDW-SD 40.9 36.4 - 46.3      NRBC 0 0 - 0      IMMATURE GRANULOCYTES 0.5 0.0 - 3.0 %    NEUTROPHILS 76.3 (H) 34 - 64 %    LYMPHOCYTES 17.6 (L) 28 - 48 %    MONOCYTES 5.2 1 - 13 %    EOSINOPHILS 0.2 0 - 5 %    BASOPHILS 0.2 0 - 3 %   METABOLIC PANEL, BASIC    Collection Time: 06/15/17  8:50 PM   Result Value Ref Range    Sodium 134 (L) 136 - 145 mEq/L    Potassium 3.7 3.5 - 5.1 mEq/L    Chloride 100 98 - 107 mEq/L    CO2 27 21 - 32 mEq/L    Glucose 218 (H) 74 - 106 mg/dl    BUN 20 7 - 25 mg/dl    Creatinine 1.1 0.6 - 1.3 mg/dl    GFR est AA >60.0      GFR est non-AA 53      Calcium 9.1 8.5 - 10.1 mg/dl    Anion gap 7 5 - 15 mmol/L   TROPONIN I    Collection Time: 06/15/17  8:50 PM   Result Value Ref Range    Troponin-I <0.015 0.000 - 0.045 ng/ml   D DIMER    Collection Time: 06/15/17  8:50 PM   Result Value Ref Range    D DIMER 0.42 0.01 - 0.50 ug/mL (FEU)   EKG, 12 LEAD, INITIAL    Collection Time: 06/15/17  9:21 PM   Result Value Ref Range    Ventricular Rate 112 BPM    Atrial Rate 112 BPM    P-R Interval 158 ms    QRS Duration 74 ms    Q-T Interval 320 ms    QTC Calculation (Bezet) 436 ms    Calculated P Axis 58 degrees     Calculated R Axis 44 degrees    Calculated T Axis 68 degrees    Diagnosis       Sinus tachycardia  Nonspecific T wave abnormality  Abnormal ECG  When compared with ECG of 01-Sep-2016 07:22,  Criteria for Septal infarct are no longer present  Confirmed by Mayford Knife, M.D., Mohit ([id number]) on 06/16/2017 7:28:46 AM     TROPONIN I    Collection Time: 06/16/17  6:20 AM   Result Value Ref Range    Troponin-I <0.015 0.000 - 0.045 ng/ml   CBC WITH AUTOMATED DIFF    Collection Time: 06/16/17  6:20 AM   Result Value Ref Range    WBC 16.4 (H) 4.0 - 11.0 1000/mm3    RBC 4.31 3.60 - 5.20 M/uL    HGB 12.9 (L) 13.0 - 17.2 gm/dl    HCT 38.0 37.0 - 50.0 %    MCV 88.2 80.0 - 98.0 fL    MCH 29.9 25.4 - 34.6 pg    MCHC 33.9 30.0 - 36.0 gm/dl    PLATELET 286 140 - 450 1000/mm3    MPV 11.0 (H) 6.0 - 10.0 fL    RDW-SD 42.6 36.4 - 46.3      NRBC 0 0 - 0      IMMATURE GRANULOCYTES 0.6 0.0 - 3.0 %    NEUTROPHILS 93.4 (H) 34 - 64 %    LYMPHOCYTES 5.1 (L)  28 - 48 %    MONOCYTES 0.7 (L) 1 - 13 %    EOSINOPHILS 0.1 0 - 5 %    BASOPHILS 0.1 0 - 3 %   METABOLIC PANEL, BASIC    Collection Time: 06/16/17  6:20 AM   Result Value Ref Range    Sodium 133 (L) 136 - 145 mEq/L    Potassium 4.7 3.5 - 5.1 mEq/L    Chloride 98 98 - 107 mEq/L    CO2 20 (L) 21 - 32 mEq/L    Glucose 350 (H) 74 - 106 mg/dl    BUN 22 7 - 25 mg/dl    Creatinine 1.5 (H) 0.6 - 1.3 mg/dl    GFR est AA 45.0      GFR est non-AA 37      Calcium 9.7 8.5 - 10.1 mg/dl    Anion gap 15 5 - 15 mmol/L   PROTHROMBIN TIME + INR    Collection Time: 06/16/17  6:20 AM   Result Value Ref Range    Prothrombin time 12.8 10.2 - 12.9 seconds    INR 1.1 0.1 - 1.1     LIPID PANEL    Collection Time: 06/16/17  6:20 AM   Result Value Ref Range    Cholesterol, total 130 (L) 140 - 199 mg/dl    HDL Cholesterol 63 40 - 96 mg/dl    Triglyceride 61 29 - 150 mg/dl    LDL, calculated 55 0 - 130 mg/dl    CHOL/HDL Ratio 2.1 0.0 - 4.4 Ratio   TSH 3RD GENERATION    Collection Time: 06/16/17  6:20 AM   Result Value Ref Range     TSH 0.276 (L) 0.358 - 3.740 uIU/mL   NT-PRO BNP    Collection Time: 06/16/17  6:20 AM   Result Value Ref Range    NT pro-BNP 63.0 0.0 - 125.0 pg/ml   GLUCOSE, POC    Collection Time: 06/16/17  8:31 AM   Result Value Ref Range    Glucose (POC) 293 (H) 65 - 105 mg/dL   GLUCOSE, POC    Collection Time: 06/16/17 12:19 PM   Result Value Ref Range    Glucose (POC) 192 (H) 65 - 105 mg/dL   POC URINE MACROSCOPIC    Collection Time: 06/16/17 12:31 PM   Result Value Ref Range    Glucose >=1000 (A) NEGATIVE,Negative mg/dl    Bilirubin Negative NEGATIVE,Negative      Ketone Trace (A) NEGATIVE,Negative mg/dl    Specific gravity 1.020 1.005 - 1.030      Blood Small (A) NEGATIVE,Negative      pH (UA) 5.5 5 - 9      Protein 100 (A) NEGATIVE,Negative mg/dl    Urobilinogen 0.2 0.0 - 1.0 EU/dl    Nitrites Positive (A) NEGATIVE,Negative      Leukocyte Esterase Negative NEGATIVE,Negative      Color Yellow      Appearance Clear     GLUCOSE, POC    Collection Time: 06/16/17  5:33 PM   Result Value Ref Range    Glucose (POC) 221 (H) 65 - 105 mg/dL       Xr Chest Sngl V    Result Date: 06/15/2017  Chest Indication: shortness of breath Comparison: Chest x-ray 01/09/2017 Findings: AP portable upright view of the chest demonstrates that the cardiac silhouette is not enlarged.  Stable left lower lobe round nodule. Stable bibasilar interstitial prominence.        Impression: No  acute cardiopulmonary disease.       Microbiology:  All Micro Results     None           Cardiology:  Results for orders placed or performed during the hospital encounter of 06/15/17   EKG, 12 LEAD, INITIAL   Result Value Ref Range    Ventricular Rate 112 BPM    Atrial Rate 112 BPM    P-R Interval 158 ms    QRS Duration 74 ms    Q-T Interval 320 ms    QTC Calculation (Bezet) 436 ms    Calculated P Axis 58 degrees    Calculated R Axis 44 degrees    Calculated T Axis 68 degrees    Diagnosis       Sinus tachycardia  Nonspecific T wave abnormality  Abnormal ECG   When compared with ECG of 01-Sep-2016 07:22,  Criteria for Septal infarct are no longer present  Confirmed by Mayford Knife, M.D., Mohit ([id number]) on 06/16/2017 7:28:46 AM           Problem List:  Problem List as of 06/16/2017 Date Reviewed: 06/02/17          Codes Class Noted - Resolved    Chest pain ICD-10-CM: R07.9  ICD-9-CM: 786.50  06/15/2017 - Present        Dyspnea ICD-10-CM: R06.00  ICD-9-CM: 786.09  06/15/2017 - Present        Type 2 diabetes mellitus with diabetic neuropathy ([place]) ICD-10-CM: E11.40  ICD-9-CM: 250.60, 357.2  12/07/2016 - Present        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  ICD-9-CM: 569.89  08/17/2016 - Present        Cannabinoid hyperemesis syndrome ([place]) ICD-10-CM: F12.988  ICD-9-CM: 536.2, 305.20  08/17/2016 - Present        Gastritis ICD-10-CM: K29.70  ICD-9-CM: 535.50  08/16/2016 - Present        Nausea & vomiting ICD-10-CM: R11.2  ICD-9-CM: 787.01  08/16/2016 - Present        Asthma with acute exacerbation ICD-10-CM: J45.901  ICD-9-CM: 493.92  08/04/2016 - Present        Lactic acidosis ICD-10-CM: E87.2  ICD-9-CM: 276.2  07/24/2016 - Present        Leukocytosis ICD-10-CM: D72.829  ICD-9-CM: 288.60  07/24/2016 - Present        Sepsis ([place]) ICD-10-CM: A41.9  ICD-9-CM: 038.9, 995.91  07/24/2016 - Present        Asthma exacerbation ICD-10-CM: J45.901  ICD-9-CM: 493.92  07/24/2016 - Present        Type 2 diabetes mellitus with nephropathy ([place]) ICD-10-CM: E11.21  ICD-9-CM: 250.40, 583.81  06/12/2016 - Present        Sacroiliitis ([place]) ICD-10-CM: M46.1  ICD-9-CM: 720.2  11/25/2015 - Present        Essential hypertension ICD-10-CM: I10  ICD-9-CM: 401.9  10/06/2015 - Present        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  ICD-9-CM: 721.3  09/15/2015 - Present        Lumbar and sacral osteoarthritis ICD-10-CM: M47.817  ICD-9-CM: 721.3  09/15/2015 - Present        Chronic pain syndrome ICD-10-CM: G89.4  ICD-9-CM: 338.4  09/15/2015 - Present         Type 2 diabetes mellitus with hyperglycemia, without long-term current use of insulin ([place]) ICD-10-CM: E11.65  ICD-9-CM: 250.00, 790.29  08/20/2015 - Present        Acute colitis ICD-10-CM: K52.9  ICD-9-CM: 558.9  07/02/2015 -  Present        Acute hyperglycemia ICD-10-CM: R73.9  ICD-9-CM: 790.29  07/02/2015 - Present        Accelerated hypertension ICD-10-CM: I10  ICD-9-CM: 401.0  04/08/2015 - Present        Severe headache ICD-10-CM: R51  ICD-9-CM: 784.0  04/07/2015 - Present        Osteoarthritis of hips, 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  ICD-9-CM: 715.95  01/29/2015 - Present        PTSD (post-traumatic stress disorder) (Chronic) ICD-10-CM: F43.10  ICD-9-CM: 309.81  01/29/2015 - Present        Severe hypertension (Chronic) ICD-10-CM: I10  ICD-9-CM: 401.9  07/21/2014 - Present        RESOLVED: New onset type 1 diabetes mellitus, uncontrolled ([place]) ICD-10-CM: E10.65  ICD-9-CM: 250.03  07/02/2015 - 10/06/2015        RESOLVED: Recurrent major depressive disorder, in full remission ([place]) ICD-10-CM: F33.42  ICD-9-CM: 296.36  12/02/2014 - 12/02/2014        RESOLVED: Foreign body in colon ICD-10-CM: T18.4XXA  ICD-9-CM: 149  08/14/2014 - 01/29/2015        RESOLVED: Advanced care planning/counseling discussion ICD-10-CM: F02.63  ICD-9-CM: V65.49  07/21/2014 - 01/29/2015    Overview Signed 07/21/2014  3:46 PM by Lake Bells, MD     Patient given [place] application               RESOLVED: Hip pain, chronic ICD-10-CM: M25.559, G89.29  ICD-9-CM: 719.45, 338.29  07/21/2014 - 01/29/2015              Medications reviewed  Current Facility-Administered Medications   Medication Dose Route Frequency   ??? albuterol (PROVENTIL VENTOLIN) nebulizer solution 2.5 mg  2.5 mg Nebulization Q2H PRN   ??? albuterol-ipratropium (DUO-NEB) 2.5 MG-0.5 MG/3 ML  3 mL Nebulization Q6H PRN   ??? sodium chloride (NS) flush 5-10 mL  5-10 mL IntraVENous Q8H   ??? sodium chloride (NS) flush 5-10 mL  5-10 mL IntraVENous PRN    ??? naloxone (NARCAN) injection 0.1 mg  0.1 mg IntraVENous PRN   ??? enoxaparin (LOVENOX) injection 40 mg  40 mg SubCUTAneous Q24H   ??? dextrose (D50W) injection syrg 5-25 g  10-50 mL IntraVENous PRN   ??? glucagon (GLUCAGEN) injection 1 mg  1 mg IntraMUSCular PRN   ??? insulin glargine (LANTUS) injection 1-100 Units  1-100 Units SubCUTAneous QHS   ??? insulin lispro (HUMALOG) injection   SubCUTAneous AC&HS   ??? insulin lispro (HUMALOG) injection 1-100 Units  1-100 Units SubCUTAneous PRN   ??? magnesium oxide (MAG-OX) tablet 400 mg  400 mg Oral DAILY   ??? rosuvastatin (CRESTOR) tablet 5 mg  5 mg Oral QHS   ??? albuterol-ipratropium (DUO-NEB) 2.5 MG-0.5 MG/3 ML  3 mL Nebulization Q4H RT   ??? budesonide (PULMICORT) 500 mcg/2 ml nebulizer suspension  500 mcg Nebulization BID RT   ??? guaiFENesin ER (MUCINEX) tablet 600 mg  600 mg Oral Q12H   ??? loratadine (CLARITIN) tablet 10 mg  10 mg Oral DAILY   ??? montelukast (SINGULAIR) tablet 10 mg  10 mg Oral QHS   ??? butalbital-acetaminophen-caffeine (FIORICET, ESGIC) 50-325-40 mg per tablet 1 Tab  1 Tab Oral Q6H PRN   ??? oxyCODONE-acetaminophen (PERCOCET 10) 10-325 mg per tablet 1 Tab  1 Tab Oral Q12H PRN   ??? QUEtiapine SR (SEROquel XR) tablet 150 mg  150 mg Oral QHS   ??? traZODone (DESYREL) tablet 100 mg  100 mg Oral QHS   ???  spironolactone (ALDACTONE) tablet 100 mg  100 mg Oral DAILY   ??? [START ON 06/17/2017] amLODIPine (NORVASC) tablet 10 mg  10 mg Oral DAILY   ??? [START ON 06/17/2017] predniSONE (DELTASONE) tablet 40 mg  40 mg Oral DAILY WITH BREAKFAST   ??? acetaminophen (TYLENOL) tablet 650 mg  650 mg Oral Q4H PRN     Current Outpatient Medications   Medication Sig   ??? nabumetone (RELAFEN) 750 mg tablet TAKE 1 TABLET BY MOUTH TWICE DAILY AS NEEDED FOR PAIN   ??? metFORMIN ER (GLUCOPHAGE XR) 750 mg tablet TAKE 2 TABLETS EVERY DAY   ??? spironolactone (ALDACTONE) 100 mg tablet TAKE 1 TABLET BY MOUTH DAILY   ??? polyethylene glycol (MIRALAX) 17 gram packet Take 1 Packet by mouth daily.    ??? oxyCODONE-acetaminophen (PERCOCET 10) 10-325 mg per tablet Take 1 Tab by mouth two (2) times daily as needed for Pain. Max Daily Amount: 2 Tabs.   ??? predniSONE (DELTASONE) 20 mg tablet Take 3 tabs daily for 3 days, 2 tabs daily for 3 days, 1 tab daily for 3 days, then 1/2 tablet daily for 3 days   ??? magnesium oxide (MAG-OX) 400 mg tablet TAKE 1 TABLET BY MOUTH DAILY   ??? VENTOLIN HFA 90 mcg/actuation inhaler INHALE 2 PUFFS BY MOUTH EVERY 4 HOURS AS NEEDED FOR WHEEZING   ??? triamcinolone (NASACORT) 55 mcg nasal inhaler 2 Sprays by Both Nostrils route daily.   ??? montelukast (SINGULAIR) 10 mg tablet TAKE 1 TABLET BY MOUTH DAILY   ??? QUEtiapine SR (SEROQUEL XR) 150 mg sr tablet TAKE 1 TABLET BY MOUTH NIGHTLY   ??? traZODone (DESYREL) 100 mg tablet TAKE 1 TABLET BY MOUTH NIGHTLY (Patient not taking: Reported on 02/22/2017)   ??? benzonatate (TESSALON PERLES) 100 mg capsule Take 1 Cap by mouth three (3) times daily as needed for Cough.   ??? rosuvastatin (CRESTOR) 5 mg tablet TAKE 1 TABLET BY MOUTH NIGHTLY   ??? losartan (COZAAR) 100 mg tablet Take 1 Tab by mouth daily. Indications: hypertension   ??? amLODIPine (NORVASC) 10 mg tablet TAKE ONE TABLET BY MOUTH ONCE DAILY   ??? Blood-Glucose Meter monitoring kit Check sugars 2 times per day, fasting and 2 hours after dinner   ??? Lancets misc Check sugars twice daily   ??? glucose blood VI test strips (ASCENSIA AUTODISC VI, ONE TOUCH ULTRA TEST VI) strip Check sugars twice daily         Kristen Loader, MD

## 2017-06-16 NOTE — ED Notes (Signed)
Report to obs pt transfer she ask for meds about her hip again explained must ask admiting md. She verbalize understanding.

## 2017-06-16 NOTE — ED Notes (Signed)
Bedside shift change report given to Sharon RN (oncoming nurse) by Azuree Minish RN (offgoing nurse). Report included the following information SBAR.

## 2017-06-17 LAB — GLUCOSE, POC
Glucose (POC): 218 mg/dL — ABNORMAL HIGH (ref 65–105)
Glucose (POC): 160 mg/dL — ABNORMAL HIGH (ref 65–105)
Glucose (POC): 161 mg/dL — ABNORMAL HIGH (ref 65–105)
Glucose (POC): 145 mg/dL — ABNORMAL HIGH (ref 65–105)

## 2017-06-17 MED ORDER — IPRATROPIUM-ALBUTEROL 2.5 MG-0.5 MG/3 ML NEB SOLUTION
2.5 mg-0.5 mg/3 ml | Freq: Four times a day (QID) | RESPIRATORY_TRACT | Status: DC
Start: 2017-06-17 — End: 2017-06-18
  Administered 2017-06-17 – 2017-06-18 (×5): via RESPIRATORY_TRACT

## 2017-06-17 MED ORDER — ALBUTEROL SULFATE 0.083 % (0.83 MG/ML) SOLN FOR INHALATION
2.5 mg /3 mL (0.083 %) | RESPIRATORY_TRACT | Status: AC
Start: 2017-06-17 — End: 2017-06-17
  Administered 2017-06-17: 14:00:00 via RESPIRATORY_TRACT

## 2017-06-17 MED ORDER — BUTALBITAL-ACETAMINOPHEN-CAFFEINE 50 MG-325 MG-40 MG TAB
50-325-40 mg | ORAL | Status: DC | PRN
Start: 2017-06-17 — End: 2017-06-18

## 2017-06-17 MED ORDER — AMLODIPINE 5 MG TAB
5 mg | ORAL | Status: AC
Start: 2017-06-17 — End: 2017-06-17
  Administered 2017-06-17: 05:00:00 via ORAL

## 2017-06-17 MED ORDER — METHYLPREDNISOLONE (PF) 40 MG/ML IJ SOLR
40 mg/mL | Freq: Two times a day (BID) | INTRAMUSCULAR | Status: DC
Start: 2017-06-17 — End: 2017-06-18
  Administered 2017-06-18 (×4): via INTRAVENOUS

## 2017-06-17 MED ORDER — METHYLPREDNISOLONE (PF) 40 MG/ML IJ SOLR
40 mg/mL | Freq: Three times a day (TID) | INTRAMUSCULAR | Status: DC
Start: 2017-06-17 — End: 2017-06-17
  Administered 2017-06-17: 14:00:00 via INTRAVENOUS

## 2017-06-17 MED FILL — QUETIAPINE SR 50 MG 24 HR TAB: 50 mg | ORAL | Qty: 3

## 2017-06-17 MED FILL — LOVENOX 40 MG/0.4 ML SUBCUTANEOUS SYRINGE: 40 mg/0.4 mL | SUBCUTANEOUS | Qty: 0.4

## 2017-06-17 MED FILL — MUCINEX 600 MG TABLET, EXTENDED RELEASE: 600 mg | ORAL | Qty: 1

## 2017-06-17 MED FILL — AMLODIPINE 5 MG TAB: 5 mg | ORAL | Qty: 1

## 2017-06-17 MED FILL — SPIRONOLACTONE 100 MG TAB: 100 mg | ORAL | Qty: 1

## 2017-06-17 MED FILL — ZOLPIDEM 5 MG TAB: 5 mg | ORAL | Qty: 1

## 2017-06-17 MED FILL — BUDESONIDE 0.5 MG/2 ML NEB SUSPENSION: 0.5 mg/2 mL | RESPIRATORY_TRACT | Qty: 1

## 2017-06-17 MED FILL — LORATADINE 10 MG TAB: 10 mg | ORAL | Qty: 1

## 2017-06-17 MED FILL — BUTALBITAL-ACETAMINOPHEN-CAFFEINE 50 MG-325 MG-40 MG TAB: 50-325-40 mg | ORAL | Qty: 1

## 2017-06-17 MED FILL — MAGNESIUM OXIDE 400 MG TAB: 400 mg | ORAL | Qty: 1

## 2017-06-17 MED FILL — AMLODIPINE 5 MG TAB: 5 mg | ORAL | Qty: 2

## 2017-06-17 MED FILL — BD POSIFLUSH NORMAL SALINE 0.9 % INJECTION SYRINGE: INTRAMUSCULAR | Qty: 10

## 2017-06-17 MED FILL — ACETAMINOPHEN 325 MG TABLET: 325 mg | ORAL | Qty: 2

## 2017-06-17 MED FILL — IPRATROPIUM-ALBUTEROL 2.5 MG-0.5 MG/3 ML NEB SOLUTION: 2.5 mg-0.5 mg/3 ml | RESPIRATORY_TRACT | Qty: 3

## 2017-06-17 MED FILL — ROSUVASTATIN 5 MG TAB: 5 mg | ORAL | Qty: 1

## 2017-06-17 MED FILL — TRAZODONE 50 MG TAB: 50 mg | ORAL | Qty: 2

## 2017-06-17 MED FILL — MONTELUKAST 10 MG TAB: 10 mg | ORAL | Qty: 1

## 2017-06-17 MED FILL — PREDNISONE 20 MG TAB: 20 mg | ORAL | Qty: 2

## 2017-06-17 MED FILL — OXYCODONE-ACETAMINOPHEN 10 MG-325 MG TAB: 10-325 mg | ORAL | Qty: 1

## 2017-06-17 NOTE — Other (Signed)
Bedside and Verbal shift change report given to Krystal Simmons, RN (oncoming nurse) by Kathy Payne, RN (offgoing nurse). Report included the following information SBAR, Kardex, ED Summary, Recent Results, Med Rec Status and Cardiac Rhythm ST.

## 2017-06-17 NOTE — Progress Notes (Signed)
Problem: Falls - Risk of  Goal: *Absence of Falls  Document Schmid Fall Risk and appropriate interventions in the flowsheet.  Outcome: Progressing Towards Goal  Fall Risk Interventions:            Medication Interventions: Bed/chair exit alarm, Patient to call before getting OOB, Teach patient to arise slowly

## 2017-06-17 NOTE — Progress Notes (Signed)
Problem: Falls - Risk of  Goal: *Absence of Falls  Document Schmid Fall Risk and appropriate interventions in the flowsheet.  Outcome: Progressing Towards Goal  Fall Risk Interventions:            Medication Interventions: Bed/chair exit alarm, Patient to call before getting OOB, Teach patient to arise slowly        Patient educated to call before getting out of the bed. Patient encouraged to move slowly when going from lying to standing.

## 2017-06-17 NOTE — Other (Signed)
Ordered for tele to be d/c were placed last night around 1830. Spoke with Dr. Estil DaftSkandaraj this morning and he stated he agreed. Tele removed.

## 2017-06-17 NOTE — Progress Notes (Signed)
Hospitalist Progress Note    Patient: [name] [name]               Sex: female          DOA: 06/15/2017       Date of Birth:  Feb 13, 1951      Age:  67 y.o.        LOS:  LOS: 0 days     PCP: Littie Deeds, MD   Assessment and Plan:   Assessment  Acute Asthma/COPD Exacerbation  HTN  DM2  Hep C Positive  Atypical chest pain  ??  Plan  ??more chest tightness and SOB after minimal exertion  nebulizer treatment around-the-clock.  supplemental oxygen to keep saturation greater than 90%.  Will keep on IV steroids-Patient showing minimal improvement,  Continue Nebs, singulair, claritin.   headache, migraine-try Fioricet  Glucomander  If no improvement would need echocardiogram    Recommend to continue hospitalization.  Expected date of discharge: 1-2 days  Plan for disposition - TBD         Subjective: Severe shortness of breath episode after taking a few steps.  Difficult to complete sentence     [name] [name] is a 67 y.o. year old female who is being seen for asthma exacerbation.    Objective: Resting in bed      Vital Signs:  Visit Vitals  BP (!) 130/91 (BP 1 Location: Right arm, BP Patient Position: Supine)   Pulse (!) 105   Temp 99.2 ??F (37.3 ??C)   Resp 18   Ht 5\' 8"  (1.727 m)   Wt 89.9 kg (198 lb 3.1 oz)   SpO2 94%   Breastfeeding? No   BMI 30.14 kg/m??     Physical Exam:  GENERAL: Alert and oriented times 3.   Eyes: Conjunctivae pink, anicteric  CARDIOVASCULAR: S1 and S2 heard, no murmur  RESPIRATORY: Diminished bilaterally entry, occasional rhonchi.  Accessory muscle involvement of the exertion.  ABDOMEN: Soft, nontender, nondistended.  NEUROLOGIC: Alert and oriented times 3. Able to move all 4 extremities.   PSYCHIATRIC: Normal affect.    Lab Results:  Recent Results (from the past 24 hour(s))   GLUCOSE, POC    Collection Time: 06/16/17 12:19 PM   Result Value Ref Range    Glucose (POC) 192 (H) 65 - 105 mg/dL   POC URINE MACROSCOPIC    Collection Time: 06/16/17 12:31 PM   Result Value Ref Range     Glucose >=1000 (A) NEGATIVE,Negative mg/dl    Bilirubin Negative NEGATIVE,Negative      Ketone Trace (A) NEGATIVE,Negative mg/dl    Specific gravity 1.610 1.005 - 1.030      Blood Small (A) NEGATIVE,Negative      pH (UA) 5.5 5 - 9      Protein 100 (A) NEGATIVE,Negative mg/dl    Urobilinogen 0.2 0.0 - 1.0 EU/dl    Nitrites Positive (A) NEGATIVE,Negative      Leukocyte Esterase Negative NEGATIVE,Negative      Color Yellow      Appearance Clear     GLUCOSE, POC    Collection Time: 06/16/17  5:33 PM   Result Value Ref Range    Glucose (POC) 221 (H) 65 - 105 mg/dL   GLUCOSE, POC    Collection Time: 06/16/17 10:06 PM   Result Value Ref Range    Glucose (POC) 218 (H) 65 - 105 mg/dL     Medications:  Current Facility-Administered Medications   Medication Dose Route Frequency   ???  albuterol-ipratropium (DUO-NEB) 2.5 MG-0.5 MG/3 ML  3 mL Nebulization Q6H RT   ??? albuterol (PROVENTIL VENTOLIN) nebulizer solution 2.5 mg  2.5 mg Nebulization NOW   ??? butalbital-acetaminophen-caffeine (FIORICET, ESGIC) 50-325-40 mg per tablet 1 Tab  1 Tab Oral Q4H PRN   ??? methylPREDNISolone (PF) (SOLU-MEDROL) injection 40 mg  40 mg IntraVENous Q12H   ??? sodium chloride (NS) flush 5-10 mL  5-10 mL IntraVENous Q8H   ??? sodium chloride (NS) flush 5-10 mL  5-10 mL IntraVENous PRN   ??? naloxone (NARCAN) injection 0.1 mg  0.1 mg IntraVENous PRN   ??? enoxaparin (LOVENOX) injection 40 mg  40 mg SubCUTAneous Q24H   ??? dextrose (D50W) injection syrg 5-25 g  10-50 mL IntraVENous PRN   ??? glucagon (GLUCAGEN) injection 1 mg  1 mg IntraMUSCular PRN   ??? insulin glargine (LANTUS) injection 1-100 Units  1-100 Units SubCUTAneous QHS   ??? insulin lispro (HUMALOG) injection   SubCUTAneous AC&HS   ??? insulin lispro (HUMALOG) injection 1-100 Units  1-100 Units SubCUTAneous PRN   ??? magnesium oxide (MAG-OX) tablet 400 mg  400 mg Oral DAILY   ??? rosuvastatin (CRESTOR) tablet 5 mg  5 mg Oral QHS   ??? budesonide (PULMICORT) 500 mcg/2 ml nebulizer suspension  500 mcg  Nebulization BID RT   ??? guaiFENesin ER (MUCINEX) tablet 600 mg  600 mg Oral Q12H   ??? loratadine (CLARITIN) tablet 10 mg  10 mg Oral DAILY   ??? montelukast (SINGULAIR) tablet 10 mg  10 mg Oral QHS   ??? butalbital-acetaminophen-caffeine (FIORICET, ESGIC) 50-325-40 mg per tablet 1 Tab  1 Tab Oral Q6H PRN   ??? oxyCODONE-acetaminophen (PERCOCET 10) 10-325 mg per tablet 1 Tab  1 Tab Oral Q12H PRN   ??? QUEtiapine SR (SEROquel XR) tablet 150 mg  150 mg Oral QHS   ??? traZODone (DESYREL) tablet 100 mg  100 mg Oral QHS   ??? spironolactone (ALDACTONE) tablet 100 mg  100 mg Oral DAILY   ??? amLODIPine (NORVASC) tablet 10 mg  10 mg Oral DAILY   ??? zolpidem (AMBIEN) tablet 5 mg  5 mg Oral QHS PRN     Madaline SavageKulasegaram Shondale Quinley, MD  June 17, 2017  8:59 AM

## 2017-06-17 NOTE — Progress Notes (Signed)
Problem: Falls - Risk of  Goal: *Absence of Falls  Document Schmid Fall Risk and appropriate interventions in the flowsheet.  Outcome: Progressing Towards Goal  Fall Risk Interventions:

## 2017-06-18 LAB — GLUCOSE, POC
Glucose (POC): 128 mg/dL — ABNORMAL HIGH (ref 65–105)
Glucose (POC): 200 mg/dL — ABNORMAL HIGH (ref 65–105)

## 2017-06-18 MED ORDER — PREDNISONE 20 MG TAB
20 mg | ORAL_TABLET | ORAL | 0 refills | Status: DC
Start: 2017-06-18 — End: 2017-07-13

## 2017-06-18 MED ORDER — ALBUTEROL SULFATE 1.25 MG/3 ML (0.042 %) SOLN FOR INHALATION
1.25 mg/3 mL | Freq: Four times a day (QID) | RESPIRATORY_TRACT | 0 refills | Status: DC | PRN
Start: 2017-06-18 — End: 2017-12-06

## 2017-06-18 MED FILL — SPIRONOLACTONE 100 MG TAB: 100 mg | ORAL | Qty: 1

## 2017-06-18 MED FILL — IPRATROPIUM-ALBUTEROL 2.5 MG-0.5 MG/3 ML NEB SOLUTION: 2.5 mg-0.5 mg/3 ml | RESPIRATORY_TRACT | Qty: 3

## 2017-06-18 MED FILL — MUCINEX 600 MG TABLET, EXTENDED RELEASE: 600 mg | ORAL | Qty: 1

## 2017-06-18 MED FILL — BUDESONIDE 0.5 MG/2 ML NEB SUSPENSION: 0.5 mg/2 mL | RESPIRATORY_TRACT | Qty: 1

## 2017-06-18 MED FILL — QUETIAPINE SR 50 MG 24 HR TAB: 50 mg | ORAL | Qty: 3

## 2017-06-18 MED FILL — BD POSIFLUSH NORMAL SALINE 0.9 % INJECTION SYRINGE: INTRAMUSCULAR | Qty: 10

## 2017-06-18 MED FILL — AMLODIPINE 5 MG TAB: 5 mg | ORAL | Qty: 2

## 2017-06-18 MED FILL — LOVENOX 40 MG/0.4 ML SUBCUTANEOUS SYRINGE: 40 mg/0.4 mL | SUBCUTANEOUS | Qty: 0.4

## 2017-06-18 MED FILL — ROSUVASTATIN 5 MG TAB: 5 mg | ORAL | Qty: 1

## 2017-06-18 MED FILL — LORATADINE 10 MG TAB: 10 mg | ORAL | Qty: 1

## 2017-06-18 MED FILL — TRAZODONE 50 MG TAB: 50 mg | ORAL | Qty: 2

## 2017-06-18 MED FILL — OXYCODONE-ACETAMINOPHEN 10 MG-325 MG TAB: 10-325 mg | ORAL | Qty: 1

## 2017-06-18 MED FILL — SOLU-MEDROL (PF) 40 MG/ML SOLUTION FOR INJECTION: 40 mg/mL | INTRAMUSCULAR | Qty: 1

## 2017-06-18 MED FILL — MAGNESIUM OXIDE 400 MG TAB: 400 mg | ORAL | Qty: 1

## 2017-06-18 MED FILL — ZOLPIDEM 5 MG TAB: 5 mg | ORAL | Qty: 1

## 2017-06-18 MED FILL — MONTELUKAST 10 MG TAB: 10 mg | ORAL | Qty: 1

## 2017-06-18 NOTE — Progress Notes (Signed)
Problem: Falls - Risk of  Goal: *Absence of Falls  Document Schmid Fall Risk and appropriate interventions in the flowsheet.  Outcome: Progressing Towards Goal  Fall Risk Interventions:            Medication Interventions: Bed/chair exit alarm, Patient to call before getting OOB, Teach patient to arise slowly

## 2017-06-18 NOTE — Other (Signed)
Bedside and Verbal shift change report given to Mahala MenghiniKrystal Simmons, RN (oncoming nurse) by Dalbert MayotteKathy Payne, RN (offgoing nurse). Report included the following information SBAR, Kardex, Intake/Output and Recent Results.    Patient is non-tele.

## 2017-06-18 NOTE — Other (Addendum)
----------  DocumentID: [id number]------------------------------------------------              [facility]                       Patient Education Report         Name: [name] FantasiaOWENS, Makenzi                  Date: 06/16/2017    MRN: [id number]                    Time: 8:39:47 PM         Patient ordered video: 'Patient Safety: Stay Safe While you are in the Hospital'    from [id number]_2223_1 via phone number: [phone] at 8:39:47 PM    Description: This program outlines some of the precautions patients can take to ensure a speedy recovery without extra complications. The video emphasizes the importance of communicating with the healthcare team.    ----------DocumentID: [id number]------------------------------------------------              [facility]                       Patient Education Report         Name: [name] FantasiaOWENS, Kiarra                  Date: 06/16/2017    MRN: [id number]                    Time: 9:04:19 PM         Patient ordered video: 'Patient Safety: Stay Safe While you are in the Hospital'    from [id number]_2223_1 via phone number: [phone] at 9:04:19 PM    Description: This program outlines some of the precautions patients can take to ensure a speedy recovery without extra complications. The video emphasizes the importance of communicating with the healthcare team.    ----------DocumentID: [id number]------------------------------------------------                       [facility]          Patient Education Report - Discharge Summary        Date: 06/18/2017   Time: 11:14:56 AM   Name: [name] FantasiaOWENS, Norman   MRN: [id number]      Account Number: [id number]      Education History:        Patient ordered video: 'Patient Safety: Stay Safe While you are in the Hospital' from [id number]_2223_1 on 06/16/2017 08:39:47 PM    Patient ordered video: 'Patient Safety: Stay Safe While you are in the Hospital' from [id number]_2223_1 on 06/16/2017 09:04:19 PM

## 2017-06-18 NOTE — Discharge Summary (Signed)
DISCHARGE SUMMARY     Patient Name: [name] [name]  Medical Record Number: [id number]  Date of Birth: March 16, 1951  Discharge Provider: Edger House, MD  Primary Care Provider: Lake Bells, MD  Admit date: 06/15/2017  Discharge date: 06/18/2017 06/19/17  Discharge Disposition: Home  Code Status: Prior    Follow-up appointments:     Follow-up Information     Follow up With Specialties Details Why Contact Info    Lake Bells, MD Family Practice In 1 week patient to make their own app't   doctor's office closed [place] [address]  [phone]      Raynelle Jan, MD Internal Medicine In 1 week patient to make their own app't   doctor's office closed [address]  [place] [address]  [phone]          Discharge diagnosis:  Acute Asthma/COPD Exacerbation  HTN  DM2  Hep C Positive  Atypical chest pain-pleuritic in nature, resolved, troponin negative.                                                     Discharge Medications:                 Discharge Medication List as of 06/18/2017 11:07 AM      START taking these medications    Details   albuterol (ACCUNEB) 1.25 mg/3 mL nebu 3 mL by Nebulization route every six (6) hours as needed., Print, Disp-100 Each, R-0         CONTINUE these medications which have CHANGED    Details   predniSONE (DELTASONE) 20 mg tablet Take 3 tabs daily for 3 days, 2 tabs daily for 3 days, 1 tab daily for 3 days, then 1/2 tablet daily for 3 days, Print, Disp-20 Tab, R-0         CONTINUE these medications which have NOT CHANGED    Details   nabumetone (RELAFEN) 750 mg tablet TAKE 1 TABLET BY MOUTH TWICE DAILY AS NEEDED FOR PAIN, Normal, Disp-30 Tab, R-0      metFORMIN ER (GLUCOPHAGE XR) 750 mg tablet TAKE 2 TABLETS EVERY DAY, Normal, Disp-180 Tab, R-1      spironolactone (ALDACTONE) 100 mg tablet TAKE 1 TABLET BY MOUTH DAILY,  Normal, Disp-90 Tab, R-0      polyethylene glycol (MIRALAX) 17 gram packet Take 1 Packet by mouth daily., Normal, Disp-100 Packet, R-1      oxyCODONE-acetaminophen (PERCOCET 10) 10-325 mg per tablet Take 1 Tab by mouth two (2) times daily as needed for Pain. Max Daily Amount: 2 Tabs., Print, Disp-10 Tab, R-0      magnesium oxide (MAG-OX) 400 mg tablet TAKE 1 TABLET BY MOUTH DAILY, Normal, Disp-90 Tab, R-0      VENTOLIN HFA 90 mcg/actuation inhaler INHALE 2 PUFFS BY MOUTH EVERY 4 HOURS AS NEEDED FOR WHEEZING, NormalPlease consider 90 day supplies to promote better adherenceDisp-1 Inhaler, R-2      triamcinolone (NASACORT) 55 mcg nasal inhaler 2 Sprays by Both Nostrils route daily., Normal, Disp-1 Bottle, R-0      montelukast (SINGULAIR) 10 mg tablet TAKE 1 TABLET BY MOUTH DAILY, Normal**Patient requests 90 days supply**Disp-90 Tab, R-1      QUEtiapine SR (SEROQUEL XR) 150 mg sr tablet TAKE 1  TABLET BY MOUTH NIGHTLY, Normal, Disp-90 Tab, R-1      traZODone (DESYREL) 100 mg tablet TAKE 1 TABLET BY MOUTH NIGHTLY, Normal, Disp-90 Tab, R-1      benzonatate (TESSALON PERLES) 100 mg capsule Take 1 Cap by mouth three (3) times daily as needed for Cough., Print, Disp-20 Cap, R-0      rosuvastatin (CRESTOR) 5 mg tablet TAKE 1 TABLET BY MOUTH NIGHTLY, Normal, Disp-90 Tab, R-2      losartan (COZAAR) 100 mg tablet Take 1 Tab by mouth daily. Indications: hypertension, Normal, Disp-90 Tab, R-2      amLODIPine (NORVASC) 10 mg tablet TAKE ONE TABLET BY MOUTH ONCE DAILY, Normal, Disp-90 Tab, R-1      Blood-Glucose Meter monitoring kit Check sugars 2 times per day, fasting and 2 hours after dinner, Normal, Disp-1 Kit, R-0      Lancets misc Check sugars twice daily, Normal, Disp-200 Each, R-2      glucose blood VI test strips (ASCENSIA AUTODISC VI, ONE TOUCH ULTRA TEST VI) strip Check sugars twice daily, Normal, Disp-100 Strip, R-5           Discharge Diet:            Diet: Cardiac Diet.    History of presenting illness:    [name] [name] is a 67 y.o. female with hx of HTN, COPD, Hep C pos, and DM with c/o having abrupt onset of shortness of breath and midsternal chest tightness beginning in the afternoon.  She has associated wheezing and her symptoms are similar to her asthma exacerbations in the past.  Patient could not find her inhaler at home and EMS was called for transport.  She denies any fever or chills.  No nausea, vomiting, abdominal pain, or diarrhea.  Her chest discomfort is increased with taking deep breaths.  She denies any readings symptoms to her neck or arms.  She is not had any lower extremity pain or swelling.  ??  Hospital course:   Patient was admitted to medical floor. Continued on IV steroids, oxygen. continued on frequent neb therapy. Respiratory status . Wheezing improved. Patient insisted on getting discharged and sent on oral steroids, neb treatment. She has a pulmonology and will follow up as outpatient  Complained exertional dyspnea, improved after asthma treatment. Consider echocardiogram as outpatient if symptoms recurs/ persists  Physical exam:   General appearance: alert, cooperative, no distress.  Head: Normocephalic,  atraumatic  Eyes: conjunctivae pink,anicteric  Lungs: clear to auscultation bilaterally  Heart: regular rate and rhythm, S1, S2 normal, no murmur  Abdomen: soft, non-tender. Bowel sounds normal.   Extremities: no cyanosis or edema    Diagnostic Results:  Xr Chest Sngl V    Result Date: 06/15/2017  Impression: No acute cardiopulmonary disease.     Discharge condition:  improved  Discharge activity and restrictions: Activity as tolerated    Lake Bells, MD,  thank you for allowing [place] to participate in the care of this patient.     Total time spent was 45 minutes  of which greater than 50% spent on counseling and coordination of care.  Edger House, MD  06/19/2017  9:44 AM

## 2017-06-18 NOTE — Other (Signed)
Reviewed discharge instructions, medications, and appointments with the patient. Verbalized understanding. No questions or concerns at this time.

## 2017-06-18 NOTE — Other (Signed)
Discharge Plan:  Home with no needs     Discharge Date:     06/18/2017     Assisted Living Facility:  No     FOC given : yes    The facility confirmed that they are ready to receive the patient:     Yes/No; the CM spoke with    N/a     Is patient going to snf? No      Home Health Needed:     No     Home Health Agency:    N/a       Confirmed start of care with the home health agency and spoke with:      N/a     DME needed and ordered for Discharge:    Yes, Nebulizer given to pt from supply room, appropriate paperwork filled out and returned      DME Company:   1[employer]     CM confirmed delivery of DME: with    Given to pt from supply room appropriate paperwork filled out     TCC Referral:   No     Medication Assistance given    No     meds given (please list)   No     Change(MDC/SSDI) Referral/outcome    No     Transportation: Family

## 2017-06-18 NOTE — Discharge Summary (Signed)
Discharge Summary by Edger House, MD at 06/18/17 [phone]                Author: Edger House, MD  Service: Hospitalist  Author Type: Physician       Filed: 06/19/17 1311  Date of Service: 06/18/17 0944  Status: Signed          Editor: Edger House, MD (Physician)                                                                                                  DISCHARGE SUMMARY       Patient Name: [name] [name]   Medical Record Number: [id number]   Date of Birth: 01-18-1951   Discharge Provider: Edger House, MD   Primary Care Provider: Lake Bells, MD   Admit date: 06/15/2017   Discharge date: 06/18/2017 06/19/17   Discharge Disposition: Home   Code Status: Prior      Follow-up appointments:         Follow-up Information               Follow up With  Specialties  Details  Why  Contact Info              Lake Bells, MD  Family Practice  In 1 week  patient to make their own app't   doctor's office closed  [place] [address]   [phone]                 Raynelle Jan, MD  Internal Medicine  In 1 week  patient to make their own app't   doctor's office closed  [address]   [place] [address]   [phone]                Discharge diagnosis:   Acute Asthma/COPD Exacerbation   HTN   DM2   Hep C Positive   Atypical chest pain-pleuritic in nature, resolved, troponin negative.                                                       Discharge Medications :                     Discharge Medication List as of 06/18/2017 11:07 AM              START taking these medications          Details        albuterol (ACCUNEB) 1.25 mg/3 mL nebu  3 mL by Nebulization route every six (6) hours as needed., Print, Disp-100 Each, R-0                     CONTINUE these medications which have CHANGED          Details  predniSONE (DELTASONE) 20 mg tablet  Take 3 tabs daily for 3 days, 2 tabs daily for 3 days, 1 tab  daily for 3 days, then 1/2 tablet daily for 3 days, Print, Disp-20 Tab, R-0                     CONTINUE these medications which have NOT CHANGED          Details        nabumetone (RELAFEN) 750 mg tablet  TAKE 1 TABLET BY MOUTH TWICE DAILY AS NEEDED FOR PAIN, Normal, Disp-30 Tab, R-0               metFORMIN ER (GLUCOPHAGE XR) 750 mg tablet  TAKE 2 TABLETS EVERY DAY, Normal, Disp-180 Tab, R-1               spironolactone (ALDACTONE) 100 mg tablet  TAKE 1 TABLET BY MOUTH DAILY, Normal, Disp-90 Tab, R-0               polyethylene glycol (MIRALAX) 17 gram packet  Take 1 Packet by mouth daily., Normal, Disp-100 Packet, R-1               oxyCODONE-acetaminophen (PERCOCET 10) 10-325 mg per tablet  Take 1 Tab by mouth two (2) times daily as needed for Pain. Max Daily Amount: 2 Tabs., Print, Disp-10 Tab, R-0               magnesium oxide (MAG-OX) 400 mg tablet  TAKE 1 TABLET BY MOUTH DAILY, Normal, Disp-90 Tab, R-0               VENTOLIN HFA 90 mcg/actuation inhaler  INHALE 2 PUFFS BY MOUTH EVERY 4 HOURS AS NEEDED FOR WHEEZING, NormalPlease consider 90 day supplies to promote better adherenceDisp-1 Inhaler, R-2               triamcinolone (NASACORT) 55 mcg nasal inhaler  2 Sprays by Both Nostrils route daily., Normal, Disp-1 Bottle, R-0               montelukast (SINGULAIR) 10 mg tablet  TAKE 1 TABLET BY MOUTH DAILY, Normal**Patient requests 90 days supply**Disp-90 Tab, R-1               QUEtiapine SR (SEROQUEL XR) 150 mg sr tablet  TAKE 1 TABLET BY MOUTH NIGHTLY, Normal, Disp-90 Tab, R-1               traZODone (DESYREL) 100 mg tablet  TAKE 1 TABLET BY MOUTH NIGHTLY, Normal, Disp-90 Tab, R-1               benzonatate (TESSALON PERLES) 100 mg capsule  Take 1 Cap by mouth three (3) times daily as needed for Cough., Print, Disp-20 Cap, R-0               rosuvastatin (CRESTOR) 5 mg tablet  TAKE 1 TABLET BY MOUTH NIGHTLY, Normal, Disp-90 Tab, R-2               losartan (COZAAR) 100 mg tablet  Take 1 Tab by mouth daily.  Indications: hypertension, Normal, Disp-90 Tab, R-2               amLODIPine (NORVASC) 10 mg tablet  TAKE ONE TABLET BY MOUTH ONCE DAILY, Normal, Disp-90 Tab, R-1               Blood-Glucose Meter monitoring kit  Check sugars 2 times per day, fasting and 2  hours after dinner, Normal, Disp-1 Kit, R-0               Lancets misc  Check sugars twice daily, Normal, Disp-200 Each, R-2               glucose blood VI test strips (ASCENSIA AUTODISC VI, ONE TOUCH ULTRA TEST VI) strip  Check sugars twice daily, Normal, Disp-100 Strip, R-5                      Discharge Diet:             Diet: Cardiac Diet.      History of presenting illness:    [name] [name] is a 67 y.o. female with hx of HTN, COPD, Hep C pos, and DM with c/o having abrupt onset of shortness of breath and midsternal chest  tightness beginning in the afternoon.  She has associated wheezing and her symptoms are similar to her asthma exacerbations in the past.  Patient could not find her inhaler at home and EMS was called for transport.  She denies any fever or chills.   No nausea, vomiting, abdominal pain, or diarrhea.  Her chest discomfort is increased with taking deep breaths.  She denies any readings symptoms to her neck or arms.  She is not had any lower extremity pain or swelling.   ??   Hospital course:     Patient was admitted to medical floor. Continued on IV steroids, oxygen. continued on frequent neb therapy. Respiratory status . Wheezing improved. Patient insisted on getting discharged and sent on oral steroids, neb treatment. She has a pulmonology  and will follow up as outpatient   Complained exertional dyspnea, improved after asthma treatment. Consider echocardiogram as outpatient if symptoms recurs/ persists   Physical exam:    General appearance: alert, cooperative, no distress.   Head: Normocephalic,  atraumatic   Eyes: conjunctivae pink,anicteric   Lungs: clear to auscultation bilaterally   Heart: regular rate and rhythm, S1, S2 normal, no  murmur   Abdomen: soft, non-tender. Bowel sounds normal.    Extremities: no cyanosis or edema      Diagnostic Results:   Xr Chest Sngl V      Result Date: 06/15/2017   Impression: No acute cardiopulmonary disease.       Discharge condition:   improved   Discharge activity and restrictions:  Activity as tolerated      Lake Bells, MD,  thank you for allowing [place] to participate in the care of this patient.       Total time spent was 45 minutes  of which greater than 50% spent on counseling and coordination of care.   Edger House, MD   06/19/2017   9:44 AM

## 2017-06-19 NOTE — Progress Notes (Signed)
Hospital Discharge Follow-Up      Date/Time:  06/19/2017 3:14 PM    Patient was admitted to [facility] on 06/15/17 and discharged on 06/18/17 for pleuritic chest pain. The physician discharge summary was available at the time of outreach.  Patient was contacted within 1 business days of discharge.    Method of communication with provider :chart routing    Inpatient RRAT score: High Risk            25       Total Score        3 Has Seen PCP in Last 6 Months (Yes=3, No=0)    4 IP Visits Last 12 Months (1-3=4, 4=9, >4=11)    18 Charlson Comorbidity Score (Age + Comorbid Conditions)        Criteria that do not apply:    Married. Living with Significant Other. Assisted Living. LTAC. SNF. or   Rehab    Patient Length of Stay (>5 days = 3)    Pt. Coverage (Medicare=5 , Medicaid, or Self-Pay=4)      Was this a readmission? no   Patient stated reason for the readmission: n/a    Nurse Navigator (NN) contacted the family by telephone to perform post hospital discharge assessment. Verified name and DOB with family as identifiers. Provided introduction to self, and explanation of the Nurse Navigator role.     Husband stated that patient was upstairs sleeping.  Stated she was doing better she just needed to rest.  NN offered to call back in the morning to talk to patient and husband said between 8 and 10 would be good.    Disease Specific:   N/A    Home Health orders at discharge: none    Advance Care Planning:   Does patient have an Advance Directive:  reviewed and current     Medication(s):   New Medications at Discharge:   albuterol (ACCUNEB) 1.25 mg/3 mL nebu 3 mL by Nebulization route every six (6) hours as needed., Print, Disp-100 Each, R-0   Changed Medications at Discharge:   predniSONE (DELTASONE) 20 mg tablet Take 3 tabs daily for 3 days, 2 tabs daily for 3 days, 1 tab daily for 3 days, then 1/2 tablet daily for 3 days, Print, Disp-20 Tab, R-0   Discontinued Medications at Discharge: none     Medication reconciliation was performed with family, who verbalizes understanding of administration of home medications.  There were no barriers to obtaining medications identified at this time.    Referral to Pharm D needed: no     Current Outpatient Medications   Medication Sig   ??? predniSONE (DELTASONE) 20 mg tablet Take 3 tabs daily for 3 days, 2 tabs daily for 3 days, 1 tab daily for 3 days, then 1/2 tablet daily for 3 days   ??? albuterol (ACCUNEB) 1.25 mg/3 mL nebu 3 mL by Nebulization route every six (6) hours as needed.   ??? nabumetone (RELAFEN) 750 mg tablet TAKE 1 TABLET BY MOUTH TWICE DAILY AS NEEDED FOR PAIN   ??? metFORMIN ER (GLUCOPHAGE XR) 750 mg tablet TAKE 2 TABLETS EVERY DAY   ??? spironolactone (ALDACTONE) 100 mg tablet TAKE 1 TABLET BY MOUTH DAILY   ??? polyethylene glycol (MIRALAX) 17 gram packet Take 1 Packet by mouth daily.   ??? oxyCODONE-acetaminophen (PERCOCET 10) 10-325 mg per tablet Take 1 Tab by mouth two (2) times daily as needed for Pain. Max Daily Amount: 2 Tabs.   ??? magnesium  oxide (MAG-OX) 400 mg tablet TAKE 1 TABLET BY MOUTH DAILY   ??? VENTOLIN HFA 90 mcg/actuation inhaler INHALE 2 PUFFS BY MOUTH EVERY 4 HOURS AS NEEDED FOR WHEEZING   ??? triamcinolone (NASACORT) 55 mcg nasal inhaler 2 Sprays by Both Nostrils route daily.   ??? montelukast (SINGULAIR) 10 mg tablet TAKE 1 TABLET BY MOUTH DAILY   ??? QUEtiapine SR (SEROQUEL XR) 150 mg sr tablet TAKE 1 TABLET BY MOUTH NIGHTLY   ??? traZODone (DESYREL) 100 mg tablet TAKE 1 TABLET BY MOUTH NIGHTLY (Patient not taking: Reported on 02/22/2017)   ??? benzonatate (TESSALON PERLES) 100 mg capsule Take 1 Cap by mouth three (3) times daily as needed for Cough.   ??? rosuvastatin (CRESTOR) 5 mg tablet TAKE 1 TABLET BY MOUTH NIGHTLY   ??? losartan (COZAAR) 100 mg tablet Take 1 Tab by mouth daily. Indications: hypertension   ??? amLODIPine (NORVASC) 10 mg tablet TAKE ONE TABLET BY MOUTH ONCE DAILY   ??? Blood-Glucose Meter monitoring kit Check sugars 2 times per day, fasting  and 2 hours after dinner   ??? Lancets misc Check sugars twice daily   ??? glucose blood VI test strips (ASCENSIA AUTODISC VI, ONE TOUCH ULTRA TEST VI) strip Check sugars twice daily     No current facility-administered medications for this visit.

## 2017-06-20 NOTE — Progress Notes (Signed)
Hospital Discharge Follow-Up      Date/Time:  06/20/2017 10:00 AM    Patient was admitted to [facility] on 06/15/17 and discharged on 06/18/17 for pleuritic chest pain. The physician discharge summary was available at the time of outreach.  Patient was contacted within 1 business days of discharge      Top Challenges reviewed with the provider   No problems voiced just tired         Method of communication with provider :chart routing    Inpatient RRAT score: High Risk            25       Total Score        3 Has Seen PCP in Last 6 Months (Yes=3, No=0)    4 IP Visits Last 12 Months (1-3=4, 4=9, >4=11)    18 Charlson Comorbidity Score (Age + Comorbid Conditions)        Criteria that do not apply:    Married. Living with Significant Other. Assisted Living. LTAC. SNF. or   Rehab    Patient Length of Stay (>5 days = 3)    Pt. Coverage (Medicare=5 , Medicaid, or Self-Pay=4)      Was this a readmission? no   Patient stated reason for the readmission: n/a    Nurse Navigator (NN) contacted the patient by telephone to perform post hospital discharge assessment. Verified name and DOB with patient as identifiers. Provided introduction to self, and explanation of the Nurse Navigator role.     Reviewed discharge instructions and red flags with patient who verbalized understanding. Patient given an opportunity to ask questions and does not have any further questions or concerns at this time. The patient agrees to contact the PCP office for questions related to their healthcare. NN provided contact information for future reference.    Disease Specific:   N/A    Summary of patient's top problems:  1. Chest pain - resolved continuing prescribed medications.  2. Tired - did not rest well in hospital but slept most of the day yesterday and will be taking it easy for now    [place] orders at discharge: none    Barriers to care? no detectable barriers to care    Advance Care Planning:    Does patient have an Advance Directive:  reviewed and current     Medication(s):   New Medications at Discharge:   albuterol (ACCUNEB) 1.25 mg/3 mL nebu 3 mL by Nebulization route every six (6) hours as needed., Print, Disp-100 Each, R-0   Changed Medications at Discharge:   predniSONE (DELTASONE) 20 mg tablet Take 3 tabs daily for 3 days, 2 tabs daily for 3 days, 1 tab daily for 3 days, then 1/2 tablet daily for 3 days, Print, Disp-20 Tab, R-0   Discontinued Medications at Discharge: none    Medication reconciliation was performed with family, on 06/19/16 who verbalizes understanding of administration of home medications.  There were no barriers to obtaining medications identified at this time.    Referral to Pharm D needed: no   Current Outpatient Medications   Medication Sig   ??? predniSONE (DELTASONE) 20 mg tablet Take 3 tabs daily for 3 days, 2 tabs daily for 3 days, 1 tab daily for 3 days, then 1/2 tablet daily for 3 days   ??? albuterol (ACCUNEB) 1.25 mg/3 mL nebu 3 mL by Nebulization route every six (6) hours as needed.   ??? nabumetone (RELAFEN) 750 mg tablet TAKE  1 TABLET BY MOUTH TWICE DAILY AS NEEDED FOR PAIN   ??? metFORMIN ER (GLUCOPHAGE XR) 750 mg tablet TAKE 2 TABLETS EVERY DAY   ??? spironolactone (ALDACTONE) 100 mg tablet TAKE 1 TABLET BY MOUTH DAILY   ??? polyethylene glycol (MIRALAX) 17 gram packet Take 1 Packet by mouth daily.   ??? oxyCODONE-acetaminophen (PERCOCET 10) 10-325 mg per tablet Take 1 Tab by mouth two (2) times daily as needed for Pain. Max Daily Amount: 2 Tabs.   ??? magnesium oxide (MAG-OX) 400 mg tablet TAKE 1 TABLET BY MOUTH DAILY   ??? VENTOLIN HFA 90 mcg/actuation inhaler INHALE 2 PUFFS BY MOUTH EVERY 4 HOURS AS NEEDED FOR WHEEZING   ??? triamcinolone (NASACORT) 55 mcg nasal inhaler 2 Sprays by Both Nostrils route daily.   ??? montelukast (SINGULAIR) 10 mg tablet TAKE 1 TABLET BY MOUTH DAILY   ??? QUEtiapine SR (SEROQUEL XR) 150 mg sr tablet TAKE 1 TABLET BY MOUTH NIGHTLY    ??? traZODone (DESYREL) 100 mg tablet TAKE 1 TABLET BY MOUTH NIGHTLY   ??? benzonatate (TESSALON PERLES) 100 mg capsule Take 1 Cap by mouth three (3) times daily as needed for Cough.   ??? rosuvastatin (CRESTOR) 5 mg tablet TAKE 1 TABLET BY MOUTH NIGHTLY   ??? losartan (COZAAR) 100 mg tablet Take 1 Tab by mouth daily. Indications: hypertension   ??? amLODIPine (NORVASC) 10 mg tablet TAKE ONE TABLET BY MOUTH ONCE DAILY   ??? Blood-Glucose Meter monitoring kit Check sugars 2 times per day, fasting and 2 hours after dinner   ??? Lancets misc Check sugars twice daily   ??? glucose blood VI test strips (ASCENSIA AUTODISC VI, ONE TOUCH ULTRA TEST VI) strip Check sugars twice daily     No current facility-administered medications for this visit.        There are no discontinued medications.    [facility] follow up appointment(s):   Future Appointments   Date Time Provider [place]   06/28/2017  1:00 PM Lake Bells, MD [facility] SCHED       Goals     ??? Attends follow-up appointments as directed.     ??? Knowledge and adherence of prescribed medication (ie. action, side effects, missed dose, etc.).      06/20/2017 NN reviewed with patient proper way to take current prescription of prednisone.  Also discussed possible common side effects of high dose prednisone      ??? Prevent complications post hospitalization.

## 2017-06-27 ENCOUNTER — Encounter

## 2017-06-28 ENCOUNTER — Ambulatory Visit: Admit: 2017-06-28 | Discharge: 2017-06-28 | Payer: MEDICARE | Attending: Family Medicine | Primary: Physician Assistant

## 2017-06-28 NOTE — Progress Notes (Signed)
A user error has taken place: encounter opened in error, closed for administrative reasons.

## 2017-06-29 ENCOUNTER — Encounter

## 2017-06-29 ENCOUNTER — Encounter: Attending: Family Medicine | Primary: Physician Assistant

## 2017-07-05 ENCOUNTER — Encounter

## 2017-07-05 MED ORDER — NABUMETONE 750 MG TAB
750 mg | ORAL_TABLET | Freq: Two times a day (BID) | ORAL | 0 refills | Status: DC | PRN
Start: 2017-07-05 — End: 2017-07-18

## 2017-07-05 NOTE — Telephone Encounter (Signed)
Refill nabumetone.  Patient advised to use [facility] sparing[facility] by [facility] due to possible nephrotoxicity.

## 2017-07-13 ENCOUNTER — Ambulatory Visit: Admit: 2017-07-13 | Discharge: 2017-07-13 | Payer: MEDICARE | Attending: Family Medicine | Primary: Physician Assistant

## 2017-07-13 DIAGNOSIS — J45901 Unspecified asthma with (acute) exacerbation: Secondary | ICD-10-CM

## 2017-07-13 LAB — AMB POC HEMOGLOBIN A1C: Hemoglobin A1c (POC): 9.4 %

## 2017-07-13 MED ORDER — TRAZODONE 100 MG TAB
100 mg | ORAL_TABLET | ORAL | 1 refills | Status: DC
Start: 2017-07-13 — End: 2017-08-09

## 2017-07-13 MED ORDER — ROSUVASTATIN 5 MG TAB
5 mg | ORAL_TABLET | ORAL | 1 refills | Status: AC
Start: 2017-07-13 — End: ?

## 2017-07-13 MED ORDER — IPRATROPIUM-ALBUTEROL 2.5 MG-0.5 MG/3 ML NEB SOLUTION
2.5 mg-0.5 mg/3 ml | INHALATION_SOLUTION | RESPIRATORY_TRACT | 0 refills | Status: AC
Start: 2017-07-13 — End: 2017-07-13

## 2017-07-13 MED ORDER — QUETIAPINE SR 150 MG 24 HR TAB
150 mg | ORAL_TABLET | ORAL | 1 refills | Status: DC
Start: 2017-07-13 — End: 2018-02-18

## 2017-07-13 MED ORDER — KETOROLAC TROMETHAMINE 60 MG/2 ML IM
60 mg/2 mL | Freq: Once | INTRAMUSCULAR | 0 refills | Status: AC
Start: 2017-07-13 — End: 2017-07-13

## 2017-07-13 MED ORDER — LOSARTAN 100 MG TAB
100 mg | ORAL_TABLET | Freq: Every day | ORAL | 1 refills | Status: DC
Start: 2017-07-13 — End: 2017-07-17

## 2017-07-13 MED ORDER — METFORMIN SR 500 MG 24 HR TABLET
500 mg | ORAL_TABLET | ORAL | 1 refills | Status: DC
Start: 2017-07-13 — End: 2017-09-14

## 2017-07-13 MED ORDER — AMLODIPINE 10 MG TAB
10 mg | ORAL_TABLET | ORAL | 1 refills | Status: DC
Start: 2017-07-13 — End: 2017-11-18

## 2017-07-13 NOTE — Patient Instructions (Addendum)
Diabetes-  Your A1c has gone up to 9.2% up from 6.8% in June. Check blood sugars every other day fasting and 2 hours after largest meal of day twice a week. Please record these. Please increase your dose of Metformin- with your current 750mg  tab- use 2 tabs in the morning and 1 tab in the afternoon.  Your new medication is 500mg  tabs. Use 2 of these in the morning and 2 in the afternoon.      Please engage in practicing portion control per the plate method we discussed.  Keep a low carbohydrate diet.  40% of your daily caloric intake.  Also a high fiber diet of 30 grams daily.              Type 2 Diabetes: Care Instructions  Your Care Instructions    Type 2 diabetes is a disease that develops when the body's tissues cannot use insulin properly. Over time, the pancreas cannot make enough insulin. Insulin is a hormone that helps the body's cells use sugar (glucose) for energy. It also helps the body store extra sugar in muscle, fat, and liver cells.  Without insulin, the sugar cannot get into the cells to do its work. It stays in the blood instead. This can cause high blood sugar levels. A person has diabetes when the blood sugar stays too high too much of the time. Over time, diabetes can lead to diseases of the heart, blood vessels, nerves, kidneys, and eyes.  You may be able to control your blood sugar by losing weight, eating a healthy diet, and getting daily exercise. You may also have to take insulin or other diabetes medicine.  Follow-up care is a key part of your treatment and safety. Be sure to make and go to all appointments. Call your doctor if you are having problems. It's also a good idea to know your test results and keep a list of the medicines you take.  How can you care for yourself at home?  ?? Keep your blood sugar at a target level (which you set with your doctor).  ? Eat a good diet that spreads carbohydrate throughout the day.  Carbohydrate???the body's main source of fuel???affects blood sugar more than any other nutrient. Carbohydrate is in fruits, vegetables, milk, and yogurt. It also is in breads, cereals, vegetables such as potatoes and corn, and sugary foods such as candy and cakes.  ? Aim for 30 minutes of exercise on most, preferably all, days of the week. Walking is a good choice. You also may want to do other activities, such as running, swimming, cycling, or playing tennis or team sports. If your doctor says it's okay, do muscle-strengthening exercises at least 2 times a week.  ? Take your medicines exactly as prescribed. Call your doctor if you think you are having a problem with your medicine. You will get more details on the specific medicines your doctor prescribes.  ?? Check your blood sugar as often as your doctor recommends. It is important to keep track of any symptoms you have, such as low blood sugar. Also tell your doctor if you have any changes in your activities, diet, or insulin use.  ?? Talk to your doctor before you start taking aspirin every day. Aspirin can help certain people lower their risk of a heart attack or stroke. But taking aspirin isn't right for everyone, because it can cause serious bleeding.  ?? Do not smoke. If you need help quitting, talk to your doctor about  stop-smoking programs and medicines. These can increase your chances of quitting for good.  ?? Keep your cholesterol and blood pressure at normal levels. You may need to take one or more medicines to reach your goals. Take them exactly as directed. Do not stop or change a medicine without talking to your doctor first.  When should you call for help?  Call 911 anytime you think you may need emergency care. For example, call if:  ?? ?? You passed out (lost consciousness), or you suddenly become very sleepy or confused. (You may have very low blood sugar.)   ??Call your doctor now or seek immediate medical care if:   ?? ?? Your blood sugar is 300 mg/dL or is higher than the level your doctor has set for you.   ?? ?? You have symptoms of low blood sugar, such as:  ? Sweating.  ? Feeling nervous, shaky, and weak.  ? Extreme hunger and slight nausea.  ? Dizziness and headache.  ? Blurred vision.  ? Confusion.   ??Watch closely for changes in your health, and be sure to contact your doctor if:  ?? ?? You often have problems controlling your blood sugar.   ?? ?? You have symptoms of long-term diabetes problems, such as:  ? New vision changes.  ? New pain, numbness, or tingling in your hands or feet.  ? Skin problems.   Where can you learn more?  Go to [employer].  Enter C553 in the search box to learn more about "Type 2 Diabetes: Care Instructions."  Current as of: January 04, 2017  Content Version: 11.9  ?? 2006-2018 [employer]. Care instructions adapted under license by [employer] (which disclaims liability or warranty for this information). If you have questions about a medical condition or this instruction, always ask your healthcare professional. [employer], Inc[employer] disclaims any warranty or liability for your use of this information.

## 2017-07-13 NOTE — Progress Notes (Signed)
Hip Pain (right hip)        HPI: [name] [name] is a 67 y.o. female [place]  Here with reports of acute exacerbation of her chronic right hip pain not controlled by her usual Relafen use. She was previously in pain management with Dr Leron Croak but was discharged due [facility] use.      She has hx of asthma and has felt SOB for today. Denies cough, sputum production, fever.        Past Medical History:   Diagnosis Date   ??? Arthritis    ??? Chronic pain syndrome    ??? COPD (chronic obstructive pulmonary disease) ([place])    ??? DM2 (diabetes mellitus, type 2) ([place])    ??? GERD (gastroesophageal reflux disease)    ??? Hepatitis C     HEP C   ??? HTN (hypertension)    ??? Lung nodule, multiple     (CT 10/2016) Small left upper lobe and 1.7 x 1.6 cm left lower lobe nodules not significantly changed from 07/23/2016 and 03/22/2016   ??? Marijuana use    ??? PTSD (post-traumatic stress disorder)     lived through the [place] in 1993   ??? Thoracic ascending aortic aneurysm ([place])     4.4cm noted on CT Chest (10/2016)   ??? Thyroid nodule     2.5 cm stable right thyroid nodule (CT 10/2016)       Current Outpatient Medications   Medication Sig   ??? albuterol-ipratropium (DUO-NEB) 2.5 mg-0.5 mg/3 ml nebu 3 mL by Nebulization route now for 1 dose.   ??? ketorolac tromethamine (TORADOL) 60 mg/2 mL soln 2 mL by IntraMUSCular route once for 1 dose.   ??? nabumetone (RELAFEN) 750 mg tablet Take 1 Tab by mouth two (2) times daily as needed for Pain. Use sparingly   ??? albuterol (ACCUNEB) 1.25 mg/3 mL nebu 3 mL by Nebulization route every six (6) hours as needed.   ??? metFORMIN ER (GLUCOPHAGE XR) 750 mg tablet TAKE 2 TABLETS EVERY DAY   ??? spironolactone (ALDACTONE) 100 mg tablet TAKE 1 TABLET BY MOUTH DAILY   ??? polyethylene glycol (MIRALAX) 17 gram packet Take 1 Packet by mouth daily.   ??? oxyCODONE-acetaminophen (PERCOCET 10) 10-325 mg per tablet Take 1 Tab by mouth two (2) times daily as needed for Pain. Max Daily Amount: 2 Tabs.    ??? magnesium oxide (MAG-OX) 400 mg tablet TAKE 1 TABLET BY MOUTH DAILY   ??? VENTOLIN HFA 90 mcg/actuation inhaler INHALE 2 PUFFS BY MOUTH EVERY 4 HOURS AS NEEDED FOR WHEEZING   ??? montelukast (SINGULAIR) 10 mg tablet TAKE 1 TABLET BY MOUTH DAILY   ??? QUEtiapine SR (SEROQUEL XR) 150 mg sr tablet TAKE 1 TABLET BY MOUTH NIGHTLY   ??? traZODone (DESYREL) 100 mg tablet TAKE 1 TABLET BY MOUTH NIGHTLY   ??? benzonatate (TESSALON PERLES) 100 mg capsule Take 1 Cap by mouth three (3) times daily as needed for Cough.   ??? rosuvastatin (CRESTOR) 5 mg tablet TAKE 1 TABLET BY MOUTH NIGHTLY   ??? losartan (COZAAR) 100 mg tablet Take 1 Tab by mouth daily. Indications: hypertension   ??? amLODIPine (NORVASC) 10 mg tablet TAKE ONE TABLET BY MOUTH ONCE DAILY   ??? Blood-Glucose Meter monitoring kit Check sugars 2 times per day, fasting and 2 hours after dinner   ??? Lancets misc Check sugars twice daily   ??? glucose blood VI test strips (ASCENSIA AUTODISC VI, ONE TOUCH ULTRA  TEST VI) strip Check sugars twice daily   ??? predniSONE (DELTASONE) 20 mg tablet Take 3 tabs daily for 3 days, 2 tabs daily for 3 days, 1 tab daily for 3 days, then 1/2 tablet daily for 3 days   ??? triamcinolone (NASACORT) 55 mcg nasal inhaler 2 Sprays by Both Nostrils route daily.     No current facility-administered medications for this visit.        Allergies   Allergen Reactions   ??? Chocolate [Cocoa] Sneezing       Past Surgical History:   Procedure Laterality Date   ??? HX ENDOSCOPY     ??? HX HIP REPLACEMENT Right 01/29/2015   ??? HX HIP REPLACEMENT     ??? HX HYSTERECTOMY  2010    hysterectomy       Family History   Problem Relation Age of Onset   ??? Hypertension Mother    ??? Cancer Maternal Aunt    ??? Alcohol abuse Maternal Grandfather        Social History     Socioeconomic History   ??? Marital status: MARRIED     Spouse name: Not on file   ??? Number of children: Not on file   ??? Years of education: Not on file   ??? Highest education level: Not on file   Social Needs    ??? Financial resource strain: Not on file   ??? Food insecurity - worry: Not on file   ??? Food insecurity - inability: Not on file   ??? Transportation needs - medical: Not on file   ??? Transportation needs - non-medical: Not on file   Occupational History   ??? Not on file   Tobacco Use   ??? Smoking status: Never Smoker   ??? Smokeless tobacco: Never Used   Substance and Sexual Activity   ??? Alcohol use: No   ??? Drug use: Yes     Types: Marijuana   ??? Sexual activity: No   Other Topics Concern   ??? Not on file   Social History Narrative   ??? Not on file       Gen- No weight loss, No Malaise, No fatigue  Eyes- No dipoplia, blurry vision  CVS- No Chest pain, no palpitations, no edema  Neuro- No headaches  Skin- No easy bruising, No rashes      Visit Vitals  BP 102/80 (BP 1 Location: Left arm, BP Patient Position: Sitting)   Pulse 64   Temp 98.6 ??F (37 ??C) (Oral)   Resp 20   Ht 5' 8"  (1.727 m)   Wt 205 lb (93 kg)   SpO2 96%   BMI 31.17 kg/m??       GEN- NAD, AAOx3  CVS- RRR, +S1, +S2, No Murmurs, No JVD  PULM- + faint scattered wheezes, diminished breath sounds.  No Rales/Rhonchi. Post nebulizer-  CTABL  EXT- No edema  NEURO-In WC  PSYCH- Euthymic, normal afffect    Results for orders placed or performed in visit on 07/13/17   AMB POC HEMOGLOBIN A1C   Result Value Ref Range    Hemoglobin A1c (POC) 9.4 %         Assesment:  1. Asthma with acute exacerbation, unspecified asthma severity, unspecified whether persistent  Ms. Pawelski has history of asthma.  Had faint wheezes on exam initially upon presentation.  Oxygen saturation was 96% prior to treatment.  She was treated with duo nebs and she started to feel better.  Wheezes were no longer  present on exam post nebulizer treatment.  Recommend she continue use of her albuterol at home as well as nebulizer treatment at that she does have also at home.    2. Wheezing  Secondary to #1  - albuterol-ipratropium (DUO-NEB) 2.5 mg-0.5 mg/3 ml nebu; 3 mL by  Nebulization route now for 1 dose.  Dispense: 1 Nebule; Refill: 0    3. Shortness of breath  Secondary to #1  - albuterol-ipratropium (DUO-NEB) 2.5 mg-0.5 mg/3 ml nebu; 3 mL by Nebulization route now for 1 dose.  Dispense: 1 Nebule; Refill: 0    4. Chronic right hip pain  Patient with history of chronic right hip pain after hip replacement surgery with a recent exacerbation that is not related to trauma.  We have treated her today with Toradol and she did find significant relief in her pain.  We are actively seeking pain management for her for chronic pain.    - ketorolac tromethamine (TORADOL) 60 mg/2 mL soln; 2 mL by IntraMUSCular route once for 1 dose.  Dispense: 2 Vial; Refill: 0  - KETOROLAC TROMETHAMINE INJ  - PR THER/PROPH/DIAG INJECTION, SUBCUT/IM    5. Type 2 diabetes mellitus with diabetic neuropathy, without long-term current use of insulin ([place])  89 mellitus uncontrolled.  A1c has increased from 6.8% in June to now 9.4% today.  Mrs. Matas has gained 16 pounds per our scales in that timeframe.  I did recommend to be more conscientious of her diet.  Decrease carbohydrate intake.  And try to increase activity as tolerated although that she is limited due to her chronic right hip pain.  I have increased her metformin XR to 2000 mg total daily dose.  - AMB POC HEMOGLOBIN A1C  - rosuvastatin (CRESTOR) 5 mg tablet; TAKE 1 TABLET BY MOUTH NIGHTLY  Dispense: 90 Tab; Refill: 1  - metFORMIN ER (GLUCOPHAGE XR) 500 mg tablet; 2 tabs BID  Dispense: 360 Tab; Refill: 1    6. Essential hypertension with goal blood pressure less than 140/90  Blood pressure well controlled continue current medications.  Refill losartan and amlodipine today.  - losartan (COZAAR) 100 mg tablet; Take 1 Tab by mouth daily.  Dispense: 90 Tab; Refill: 1  - amLODIPine (NORVASC) 10 mg tablet; TAKE ONE TABLET BY MOUTH ONCE DAILY  Dispense: 90 Tab; Refill: 1    7. Need for vaccination against Streptococcus pneumoniae   Patient received second pneumococcal vaccination, Prevnar 13.  - PNEUMOCOCCAL CONJ VACCINE 13 VALENT IM    8. Encounter for immunization  Patient received flu shot today.  - INFLUENZA VIRUS VACCINE, HIGH DOSE SEASONAL, PRESERVATIVE FREE    9. Medication refill    - QUEtiapine SR (SEROQUEL XR) 150 mg sr tablet; TAKE 1 TABLET BY MOUTH NIGHTLY  Dispense: 90 Tab; Refill: 1  - traZODone (DESYREL) 100 mg tablet; TAKE 1 TABLET BY MOUTH NIGHTLY  Dispense: 90 Tab; Refill: 1    I have discussed the diagnosis with the patient and the intended management  The patient has received an after-visit summary and questions were answered concerning future plans.  I have discussed medication usage, side effects and warnings with the patient as well.I have reviewed the plan of care with the patient, accepted their input and they are in agreement with the treatment goals.         Follow-up Disposition:  Return in about 10 weeks (around 09/21/2017) for diabetes.  Sooner as needed      Lake Bells, MD                .

## 2017-07-13 NOTE — Progress Notes (Signed)
[name] [name] is a 67 y.o. female  Here for right Hip pain. Labored breathing.

## 2017-07-14 ENCOUNTER — Encounter

## 2017-07-14 NOTE — Progress Notes (Signed)
Patient followed up with PCP on 07-13-17 medical record reviewed

## 2017-07-17 MED ORDER — LOSARTAN 100 MG TAB
100 mg | ORAL_TABLET | ORAL | 0 refills | Status: DC
Start: 2017-07-17 — End: 2018-01-30

## 2017-07-18 ENCOUNTER — Encounter

## 2017-07-18 MED ORDER — NABUMETONE 750 MG TAB
750 mg | ORAL_TABLET | ORAL | 0 refills | Status: DC
Start: 2017-07-18 — End: 2017-08-01

## 2017-07-19 NOTE — Progress Notes (Signed)
Patient has graduated from the Transitions of Care Coordination  program on 07/19/2017.  Patient's symptoms are stable at this time.  Patient/family has the ability to self-manage.   Care management goals have been completed at this time. No further nurse navigator follow up scheduled.    Goals Addressed                 This Visit's Progress    ??? COMPLETED: Attends follow-up appointments as directed.   On track    ??? COMPLETED: Knowledge and adherence of prescribed medication (ie. action, side effects, missed dose, etc.).   On track     06/20/2017 NN reviewed with patient proper way to take current prescription of prednisone.  Also discussed possible common side effects of high dose prednisone      ??? COMPLETED: Prevent complications post hospitalization.   On track          Pt has nurse navigator's contact information for any further questions, concerns, or needs.  Patients upcoming visits:  No future appointments.

## 2017-07-27 ENCOUNTER — Encounter

## 2017-07-27 MED ORDER — TRAZODONE 100 MG TAB
100 mg | ORAL_TABLET | ORAL | 0 refills | Status: DC
Start: 2017-07-27 — End: 2017-08-09

## 2017-08-01 ENCOUNTER — Encounter

## 2017-08-06 ENCOUNTER — Inpatient Hospital Stay

## 2017-08-06 ENCOUNTER — Emergency Department: Admit: 2017-08-07 | Payer: MEDICARE | Primary: Physician Assistant

## 2017-08-06 DIAGNOSIS — J441 Chronic obstructive pulmonary disease with (acute) exacerbation: Secondary | ICD-10-CM

## 2017-08-06 NOTE — ED Provider Notes (Signed)
[place]  Emergency Department Treatment Report        Patient: [name] [name] Age: 67 y.o. Sex: female    Date of Birth: 12/09/1950 Admit Date: 08/06/2017 PCP: Lake Bells, MD   MRN: [id number]  CSN: [id number]  Attending: Helaine Chess, MD   Room: ER10/ER10 Time Dictated: 9:06 PM APP: Jarold Song, PA-C     Chief Complaint   Chief Complaint   Patient presents with   ??? Respiratory Distress       History of Present Illness   67 y.o. female history of COPD, hypertension and diabetes presenting from home with concerns of shortness of breath, cough and wheezing.  Patient reports she had been feeling ill over the past 5 days with what she thought was a "cold" she isn't having coughing and then using her nebulizer more frequently.  This evening while sitting at home she felt acutely worse, she states she cannot catch her breath and she felt like she was having an asthma attack.  She states this is how she has felt previously with COPD exacerbations, she was admitted with similar symptoms in January, states this feels the same.  She states in addition to her shortness of breath she had tightness in her chest described as a pain in the center of her chest, this does not radiate.  She denies fevers or chills.  She has had no nausea or vomiting.  She did have a couple episodes of diarrhea today.  No abdominal pain.  Denies pain or swelling the lower extremities.she denies history of heart disease.    Review of Systems   Review of Systems   Constitutional: Negative for chills and fever.   HENT: Negative for sore throat.    Eyes: Negative for blurred vision and double vision.   Respiratory: Positive for cough, sputum production, shortness of breath and wheezing.    Cardiovascular: Positive for chest pain. Negative for leg swelling.   Gastrointestinal: Positive for diarrhea. Negative for abdominal pain, nausea and vomiting.   Genitourinary: Negative for dysuria, frequency and urgency.    Musculoskeletal: Negative for back pain and neck pain.   Skin: Negative for rash.   Neurological: Negative for sensory change and focal weakness.       Past Medical/Surgical History     Past Medical History:   Diagnosis Date   ??? Arthritis    ??? Chronic pain syndrome    ??? COPD (chronic obstructive pulmonary disease) ([place])    ??? DM2 (diabetes mellitus, type 2) ([place])    ??? GERD (gastroesophageal reflux disease)    ??? Hepatitis C     HEP C   ??? HTN (hypertension)    ??? Lung nodule, multiple     (CT 10/2016) Small left upper lobe and 1.7 x 1.6 cm left lower lobe nodules not significantly changed from 07/23/2016 and 03/22/2016   ??? Marijuana use    ??? PTSD (post-traumatic stress disorder)     lived through the [place] in 1993   ??? Thoracic ascending aortic aneurysm ([place])     4.4cm noted on CT Chest (10/2016)   ??? Thyroid nodule     2.5 cm stable right thyroid nodule (CT 10/2016)        Past Surgical History:   Procedure Laterality Date   ??? HX ENDOSCOPY     ??? HX HIP REPLACEMENT Right 01/29/2015   ??? HX HIP REPLACEMENT     ??? HX HYSTERECTOMY  2010  hysterectomy       Social History     Social History     Socioeconomic History   ??? Marital status: MARRIED     Spouse name: Not on file   ??? Number of children: Not on file   ??? Years of education: Not on file   ??? Highest education level: Not on file   Social Needs   ??? Financial resource strain: Not on file   ??? Food insecurity - worry: Not on file   ??? Food insecurity - inability: Not on file   ??? Transportation needs - medical: Not on file   ??? Transportation needs - non-medical: Not on file   Occupational History   ??? Not on file   Tobacco Use   ??? Smoking status: Never Smoker   ??? Smokeless tobacco: Never Used   Substance and Sexual Activity   ??? Alcohol use: No   ??? Drug use: Yes     Types: Marijuana   ??? Sexual activity: No   Other Topics Concern   ??? Not on file   Social History Narrative   ??? Not on file       Family History     Family History   Problem Relation Age of Onset    ??? Hypertension Mother    ??? Cancer Maternal Aunt    ??? Alcohol abuse Maternal Grandfather        Current Medications     (Not in a hospital admission)  Prior to Admission Medications   Prescriptions Last Dose Informant Patient Reported? Taking?   Blood-Glucose Meter monitoring kit   No No   Sig: Check sugars 2 times per day, fasting and 2 hours after dinner   Lancets misc   No No   Sig: Check sugars twice daily   QUEtiapine SR (SEROQUEL XR) 150 mg sr tablet   No No   Sig: TAKE 1 TABLET BY MOUTH NIGHTLY   VENTOLIN HFA 90 mcg/actuation inhaler   No No   Sig: INHALE 2 PUFFS BY MOUTH EVERY 4 HOURS AS NEEDED FOR WHEEZING   albuterol (ACCUNEB) 1.25 mg/3 mL nebu   No No   Sig: 3 mL by Nebulization route every six (6) hours as needed.   amLODIPine (NORVASC) 10 mg tablet   No No   Sig: TAKE ONE TABLET BY MOUTH ONCE DAILY   benzonatate (TESSALON PERLES) 100 mg capsule   No No   Sig: Take 1 Cap by mouth three (3) times daily as needed for Cough.   glucose blood VI test strips (ASCENSIA AUTODISC VI, ONE TOUCH ULTRA TEST VI) strip   No No   Sig: Check sugars twice daily   losartan (COZAAR) 100 mg tablet   No No   Sig: TAKE 1 TABLET BY MOUTH DAILY FOR HYPERTENSION   magnesium oxide (MAG-OX) 400 mg tablet   No No   Sig: TAKE 1 TABLET BY MOUTH DAILY   metFORMIN ER (GLUCOPHAGE XR) 500 mg tablet   No No   Sig: 2 tabs BID   montelukast (SINGULAIR) 10 mg tablet   No No   Sig: TAKE 1 TABLET BY MOUTH DAILY   nabumetone (RELAFEN) 750 mg tablet   No No   Sig: TAKE 1 TABLET BY MOUTH TWICE DAILY AS NEEDED FOR PAIN. USE SPARINGLY   polyethylene glycol (MIRALAX) 17 gram packet   No No   Sig: Take 1 Packet by mouth daily.   rosuvastatin (CRESTOR) 5 mg tablet  No No   Sig: TAKE 1 TABLET BY MOUTH NIGHTLY   spironolactone (ALDACTONE) 100 mg tablet   No No   Sig: TAKE 1 TABLET BY MOUTH DAILY   traZODone (DESYREL) 100 mg tablet   No No   Sig: TAKE 1 TABLET BY MOUTH NIGHTLY   traZODone (DESYREL) 100 mg tablet   No No    Sig: TAKE 1 TABLET BY MOUTH EVERY NIGHT   triamcinolone (NASACORT) 55 mcg nasal inhaler   No No   Sig: 2 Sprays by Both Nostrils route daily.      Facility-Administered Medications: None     Allergies     Allergies   Allergen Reactions   ??? Chocolate [Cocoa] Sneezing       Physical Exam     ED Triage Vitals   ED Encounter Vitals Group      BP 08/06/17 2019 104/72      Pulse (Heart Rate) 08/06/17 2007 (!) 144      Resp Rate 08/06/17 2007 30      Temp 08/06/17 2007 97.5 ??F (36.4 ??C)      Temp src --       O2 Sat (%) 08/06/17 2007 97 %      Weight 08/06/17 2007 205 lb      Height --      Physical Exam   Constitutional: She is oriented to person, place, and time.   Nontoxic appearing female sitting on the stretcher in respiratory distress with increased work of breathing   HENT:   Head: Normocephalic and atraumatic.   Mouth/Throat: Oropharynx is clear and moist.   Eyes: Conjunctivae are normal.   Neck: Normal range of motion. Neck supple.   Cardiovascular: Regular rhythm, normal heart sounds and intact distal pulses. Exam reveals no gallop and no friction rub.   No murmur heard.  Tachycardic rate   Pulmonary/Chest: Breath sounds normal. She is in respiratory distress. She has no wheezes. She has no rales. She exhibits no tenderness.   Increased work of breathing  Diminished air movement noted, scattered rhonchi   Abdominal: Soft. She exhibits no distension. There is no tenderness. There is no rebound and no guarding.   Musculoskeletal: Normal range of motion.   Bilateral lower extremities are symmetric without edema, calves are soft and nontender to palpation   Neurological: She is alert and oriented to person, place, and time. GCS score is 15.   Skin: Skin is warm and dry.   Nursing note and vitals reviewed.       Impression and Management Plan   67 y.o. female history of COPD, hypertension and diabetes presenting from home with concerns of shortness of breath, cough, chest pain and wheezing  x 5 days, acutely worsening tonight. She is tachypneic, tachycardic, diminished air movement. On 2L nasal cannula she is sating in the upper 90s. States this feels identical to previous COPD exacerbations. Will start with DuoNeb, IV Solumedrol. Obtain CXR, start IV hydration. Obtain EKG and continue to monitor.     Diagnostic Studies   Lab:   Recent Results (from the past 12 hour(s))   CBC WITH AUTOMATED DIFF    Collection Time: 08/06/17  8:50 PM   Result Value Ref Range    WBC 19.6 (H) 4.0 - 11.0 1000/mm3    RBC 4.34 3.60 - 5.20 M/uL    HGB 12.4 (L) 13.0 - 17.2 gm/dl    HCT 39.0 37.0 - 50.0 %    MCV 89.9 80.0 - 98.0  fL    MCH 28.6 25.4 - 34.6 pg    MCHC 31.8 30.0 - 36.0 gm/dl    PLATELET 371 140 - 450 1000/mm3    MPV 11.8 (H) 6.0 - 10.0 fL    RDW-SD 41.6 36.4 - 46.3      NRBC 0 0 - 0      IMMATURE GRANULOCYTES 0.5 0.0 - 3.0 %    NEUTROPHILS 77.9 (H) 34 - 64 %    LYMPHOCYTES 16.0 (L) 28 - 48 %    MONOCYTES 5.1 1 - 13 %    EOSINOPHILS 0.2 0 - 5 %    BASOPHILS 0.3 0 - 3 %   METABOLIC PANEL, COMPREHENSIVE    Collection Time: 08/06/17  8:50 PM   Result Value Ref Range    Sodium 134 (L) 136 - 145 mEq/L    Potassium 5.5 (H) 3.5 - 5.1 mEq/L    Chloride 103 98 - 107 mEq/L    CO2 21 21 - 32 mEq/L    Glucose 204 (H) 74 - 106 mg/dl    BUN 31 (H) 7 - 25 mg/dl    Creatinine 1.8 (H) 0.6 - 1.3 mg/dl    GFR est AA 36.0      GFR est non-AA 30      Calcium 9.7 8.5 - 10.1 mg/dl    AST (SGOT) 11 (L) 15 - 37 U/L    ALT (SGPT) 14 12 - 78 U/L    Alk. phosphatase 86 45 - 117 U/L    Bilirubin, total 0.4 0.2 - 1.0 mg/dl    Protein, total 9.0 (H) 6.4 - 8.2 gm/dl    Albumin 3.9 3.4 - 5.0 gm/dl    Anion gap 11 5 - 15 mmol/L   TROPONIN I    Collection Time: 08/06/17  8:50 PM   Result Value Ref Range    Troponin-I <0.015 0.000 - 0.045 ng/ml   NT-PRO BNP    Collection Time: 08/06/17  8:50 PM   Result Value Ref Range    NT pro-BNP 31.0 0.0 - 125.0 pg/ml   INFLUENZA A/B, BY PCR    Collection Time: 08/06/17  9:04 PM   Result Value Ref Range     Influenza A PCR NEGATIVE NEGATIVE      Influenza B PCR NEGATIVE NEGATIVE       Labs Reviewed   CBC WITH AUTOMATED DIFF - Abnormal; Notable for the following components:       Result Value    WBC 19.6 (*)     HGB 12.4 (*)     MPV 11.8 (*)     NEUTROPHILS 77.9 (*)     LYMPHOCYTES 16.0 (*)     All other components within normal limits   METABOLIC PANEL, COMPREHENSIVE - Abnormal; Notable for the following components:    Sodium 134 (*)     Potassium 5.5 (*)     Glucose 204 (*)     BUN 31 (*)     Creatinine 1.8 (*)     AST (SGOT) 11 (*)     Protein, total 9.0 (*)     All other components within normal limits   CULTURE, BLOOD   CULTURE, BLOOD   TROPONIN I   NT-PRO BNP   INFLUENZA A/B, BY PCR   LACTIC ACID   PROCALCITONIN     Xr Chest Pa Lat    Result Date: 08/06/2017  Indication: Respiratory distress.     IMPRESSION: Chronic obstructive lung disease. Left  pulmonary nodules. Comment: AP and lateral views chest reveal the lungs are hyperaerated from chronic obstructive disease. Bullous disease is present bilaterally. Left pulmonary nodules are again observed unchanged from June 15, 2017. The heart is of normal size.     Ct Chest Wo Cont    Result Date: 08/06/2017  Indication: Respiratory distress. Hypoxia. Elevated white blood count. Diabetes. Lung nodules.     IMPRESSION: Bullous emphysema. Stable left lower lobe 18 mm nodule. Stable tiny peripheral left upper lobe nodules. 2 cm right thyroid nodule. Tiny nonobstructing right renal calculus. Comment: CT images of the chest were obtained without intravenous contrast. This was compared with Nov 03, 2016 and July 23, 2016. A 2 cm right thyroid nodule is present. There is diffuse bullous emphysema with large bullae bilaterally. An 18 x 16 mm left lower lobe nodule is again seen, unchanged from prior study. In addition there are tiny peripheral left upper lobe nodules also unchanged. No pulmonary consolidation. The heart is of normal size. The aorta is of  normal caliber. No pleural or pericardial effusion. A tiny nonobstructing right renal calculus is noted. DICOM format imaged data is available to non-affiliated external healthcare facilities or entities on a secure, media free, reciprocally searchable basis with patient authorization  for 12 months following the date of the study..       EKG: sinus tachycardia ,rate 130 bpm, PR interval 134 ms, QTc 417 ms, no diagnostic ST elevations per ED attending    ED Course/Medical Decision Making   On arrival patient placed on 2 L nasal cannula. EKG arrival revealed a sinus tachycardia with a rate of 130 bpm no acute ischemic changes per ED attending. She was started on IV fluids, administered DuoNeb x 2, IV Solumedrol. CXR shows COPD changes, no consolidation. Labs revealed a leukocytosis as well as an increase in her creatinine with acute on chronic kidney failure. Given her leukocytosis, we obtained CT chest without contrast to evaluate for pneumonia. Ct reveals bullous emphysema, stable lung nodules, no consolidation. Discussed with patient we thought she would benefit from hospital admission for her COPD exacerbation and dehydration/AKI. I spoke with Dr. Lynnette Caffey who agrees to admit to stepdown, we discussed adding blood cultures, lactic and procalcitonin. At this point, she does not appear to be septic. We will cover with IV Doxycycline. Patient admitted to stepdown.  Medications   sodium chloride (NS) flush 5-10 mL (not administered)   sodium chloride 0.9 % bolus infusion 1,000 mL (not administered)   sodium chloride 0.9 % bolus infusion 1,000 mL (not administered)   iopamidol (ISOVUE-370) 76 % injection 85 mL (not administered)   doxycycline (VIBRAMYCIN) 100 mg in 0.9% sodium chloride 250 mL IVPB (not administered)   albuterol-ipratropium (DUO-NEB) 2.5 MG-0.5 MG/3 ML (6 mL Nebulization Given 08/06/17 2020)   sodium chloride 0.9 % bolus infusion 1,000 mL (1,000 mL IntraVENous New Bag 08/06/17 2057)    methylPREDNISolone (PF) (Solu-MEDROL) injection 125 mg (125 mg IntraVENous Given 08/06/17 2056)   albuterol-ipratropium (DUO-NEB) 2.5 MG-0.5 MG/3 ML (3 mL Nebulization Given 08/06/17 2057)     Final Diagnosis       ICD-10-CM ICD-9-CM   1. Acute exacerbation of chronic obstructive pulmonary disease (COPD) ([place]) J44.1 491.21   2. Acute renal failure superimposed on chronic kidney disease, unspecified CKD stage, unspecified acute renal failure type ([facility]) N17.9 584.9    N18.9 585.9   3. Dehydration E86.0 276.51   4. Hypoxia R09.02 799.02       Disposition  Admitted      Current Discharge Medication List        Jarold Song, PA-C  August 07, 2017  9:06 PM    The patient was personally evaluated by myself and Dr. Christy Sartorius, Janine Ores, MD who agrees with the above assessment and plan.  Please note that portions of this note were completed with a voice recognition program. Efforts were made to edit the dictations but occasionally words are mis-transcribed.   My signature above authenticates this document and my orders, the final ??  diagnosis (es), discharge prescription (s), and instructions in the Epic ??  record.  If you have any questions please contact [phone].  ??  Nursing notes have been reviewed by the physician/ advanced practice ??  Clinician.

## 2017-08-06 NOTE — ED Notes (Signed)
Pt asked for a urine sample pt states she cant urinate at this time.

## 2017-08-06 NOTE — ED Triage Notes (Signed)
Pt cam directly back from triage because she is having respitory distress.  Pt states she has hx of asthma and COPD.   Pt is aaox4

## 2017-08-07 ENCOUNTER — Inpatient Hospital Stay
Admit: 2017-08-07 | Discharge: 2017-08-09 | Disposition: A | Discharge status: Home Health | Payer: MEDICARE | Attending: Internal Medicine | Admitting: Internal Medicine

## 2017-08-07 LAB — EKG 12-LEAD
Atrial Rate: 132 {beats}/min
P Axis: 68 degrees
P-R Interval: 156 ms
Q-T Interval: 270 ms
QRS Duration: 62 ms
QTc Calculation (Bazett): 400 ms
R Axis: 48 degrees
T Axis: 61 degrees
Ventricular Rate: 132 {beats}/min

## 2017-08-07 LAB — METABOLIC PANEL, BASIC
Anion gap: 11 mmol/L (ref 5–15)
Anion gap: 12 mmol/L (ref 5–15)
BUN: 28 mg/dl — ABNORMAL HIGH (ref 7–25)
BUN: 20 mg/dl (ref 7–25)
CO2: 19 mEq/L — ABNORMAL LOW (ref 21–32)
CO2: 19 mEq/L — ABNORMAL LOW (ref 21–32)
Calcium: 8.5 mg/dl (ref 8.5–10.1)
Calcium: 8.8 mg/dl (ref 8.5–10.1)
Chloride: 106 mEq/L (ref 98–107)
Chloride: 108 mEq/L — ABNORMAL HIGH (ref 98–107)
Creatinine: 1.7 mg/dl — ABNORMAL HIGH (ref 0.6–1.3)
Creatinine: 1.4 mg/dl — ABNORMAL HIGH (ref 0.6–1.3)
GFR est AA: 39
GFR est AA: 49
GFR est non-AA: 32
GFR est non-AA: 40
Glucose: 313 mg/dl — ABNORMAL HIGH (ref 74–106)
Glucose: 195 mg/dl — ABNORMAL HIGH (ref 74–106)
Potassium: 6 mEq/L — ABNORMAL HIGH (ref 3.5–5.1)
Potassium: 5.2 mEq/L — ABNORMAL HIGH (ref 3.5–5.1)
Sodium: 136 mEq/L (ref 136–145)
Sodium: 139 mEq/L (ref 136–145)

## 2017-08-07 LAB — METABOLIC PANEL, COMPREHENSIVE
ALT (SGPT): 14 U/L (ref 12–78)
ALT (SGPT): 11 U/L — ABNORMAL LOW (ref 12–78)
AST (SGOT): 11 U/L — ABNORMAL LOW (ref 15–37)
AST (SGOT): 4 U/L — ABNORMAL LOW (ref 15–37)
Albumin: 3.9 gm/dl (ref 3.4–5.0)
Albumin: 3.4 gm/dl (ref 3.4–5.0)
Alk. phosphatase: 86 U/L (ref 45–117)
Alk. phosphatase: 73 U/L (ref 45–117)
Anion gap: 11 mmol/L (ref 5–15)
Anion gap: 11 mmol/L (ref 5–15)
BUN: 31 mg/dl — ABNORMAL HIGH (ref 7–25)
BUN: 28 mg/dl — ABNORMAL HIGH (ref 7–25)
Bilirubin, total: 0.4 mg/dl (ref 0.2–1.0)
Bilirubin, total: 0.3 mg/dl (ref 0.2–1.0)
CO2: 21 mEq/L (ref 21–32)
CO2: 20 mEq/L — ABNORMAL LOW (ref 21–32)
Calcium: 9.7 mg/dl (ref 8.5–10.1)
Calcium: 8.4 mg/dl — ABNORMAL LOW (ref 8.5–10.1)
Chloride: 103 mEq/L (ref 98–107)
Chloride: 104 mEq/L (ref 98–107)
Creatinine: 1.8 mg/dl — ABNORMAL HIGH (ref 0.6–1.3)
Creatinine: 1.8 mg/dl — ABNORMAL HIGH (ref 0.6–1.3)
GFR est AA: 36
GFR est AA: 36
GFR est non-AA: 30
GFR est non-AA: 30
Glucose: 204 mg/dl — ABNORMAL HIGH (ref 74–106)
Glucose: 308 mg/dl — ABNORMAL HIGH (ref 74–106)
Potassium: 5.5 mEq/L — ABNORMAL HIGH (ref 3.5–5.1)
Potassium: 6.3 mEq/L — CR (ref 3.5–5.1)
Protein, total: 9 gm/dl — ABNORMAL HIGH (ref 6.4–8.2)
Protein, total: 7.7 gm/dl (ref 6.4–8.2)
Sodium: 134 mEq/L — ABNORMAL LOW (ref 136–145)
Sodium: 135 mEq/L — ABNORMAL LOW (ref 136–145)

## 2017-08-07 LAB — EKG, 12 LEAD, INITIAL
Atrial Rate: 132 {beats}/min
Calculated P Axis: 68 degrees
Calculated R Axis: 48 degrees
Calculated T Axis: 61 degrees
P-R Interval: 156 ms
Q-T Interval: 270 ms
QRS Duration: 62 ms
QTC Calculation (Bezet): 400 ms
Ventricular Rate: 132 {beats}/min

## 2017-08-07 LAB — CBC WITH AUTOMATED DIFF
BASOPHILS: 0.3 % (ref 0–3)
BASOPHILS: 0.1 % (ref 0–3)
EOSINOPHILS: 0.2 % (ref 0–5)
EOSINOPHILS: 0 % (ref 0–5)
HCT: 39 % (ref 37.0–50.0)
HCT: 35.4 % — ABNORMAL LOW (ref 37.0–50.0)
HGB: 12.4 gm/dl — ABNORMAL LOW (ref 13.0–17.2)
HGB: 11.9 gm/dl — ABNORMAL LOW (ref 13.0–17.2)
IMMATURE GRANULOCYTES: 0.5 % (ref 0.0–3.0)
IMMATURE GRANULOCYTES: 0.4 % (ref 0.0–3.0)
LYMPHOCYTES: 16 % — ABNORMAL LOW (ref 28–48)
LYMPHOCYTES: 9.2 % — ABNORMAL LOW (ref 28–48)
MCH: 28.6 pg (ref 25.4–34.6)
MCH: 29.7 pg (ref 25.4–34.6)
MCHC: 31.8 gm/dl (ref 30.0–36.0)
MCHC: 33.6 gm/dl (ref 30.0–36.0)
MCV: 89.9 fL (ref 80.0–98.0)
MCV: 88.3 fL (ref 80.0–98.0)
MONOCYTES: 5.1 % (ref 1–13)
MONOCYTES: 0.5 % — ABNORMAL LOW (ref 1–13)
MPV: 11.8 fL — ABNORMAL HIGH (ref 6.0–10.0)
MPV: 11.3 fL — ABNORMAL HIGH (ref 6.0–10.0)
NEUTROPHILS: 77.9 % — ABNORMAL HIGH (ref 34–64)
NEUTROPHILS: 89.8 % — ABNORMAL HIGH (ref 34–64)
NRBC: 0 (ref 0–0)
NRBC: 0 (ref 0–0)
PLATELET: 371 10*3/uL (ref 140–450)
PLATELET: 267 10*3/uL (ref 140–450)
RBC: 4.34 M/uL (ref 3.60–5.20)
RBC: 4.01 M/uL (ref 3.60–5.20)
RDW-SD: 41.6 (ref 36.4–46.3)
RDW-SD: 42.1 (ref 36.4–46.3)
WBC: 19.6 10*3/uL — ABNORMAL HIGH (ref 4.0–11.0)
WBC: 11.1 10*3/uL — ABNORMAL HIGH (ref 4.0–11.0)

## 2017-08-07 LAB — LACTIC ACID
Lactic Acid: 4.4 mmol/L — CR (ref 0.4–2.0)
Lactic Acid: 5.7 mmol/L — CR (ref 0.4–2.0)
Lactic Acid: 7 mmol/L — CR (ref 0.4–2.0)

## 2017-08-07 LAB — PROTHROMBIN TIME + INR
INR: 1.1 (ref 0.1–1.1)
Prothrombin time: 12.9 seconds (ref 10.2–12.9)

## 2017-08-07 LAB — INFLUENZA A/B, BY PCR
Influenza A PCR: NEGATIVE
Influenza B PCR: NEGATIVE

## 2017-08-07 LAB — GLUCOSE, POC
Glucose (POC): 277 mg/dL — ABNORMAL HIGH (ref 65–105)
Glucose (POC): 190 mg/dL — ABNORMAL HIGH (ref 65–105)
Glucose (POC): 205 mg/dL — ABNORMAL HIGH (ref 65–105)

## 2017-08-07 LAB — HEMOGLOBIN A1C W/O EAG: Hemoglobin A1c: 9.5 % — ABNORMAL HIGH (ref 4.2–6.3)

## 2017-08-07 LAB — TROPONIN I: Troponin-I: <0.015 ng/ml (ref 0.000–0.045)

## 2017-08-07 LAB — NT-PRO BNP: NT pro-BNP: 31 pg/ml (ref 0.0–125.0)

## 2017-08-07 LAB — PROCALCITONIN: PROCALCITONIN: <0.05 ng/ml (ref 0.00–0.50)

## 2017-08-07 MED ORDER — MONTELUKAST 10 MG TAB
10 mg | Freq: Every evening | ORAL | Status: DC
Start: 2017-08-07 — End: 2017-08-09
  Administered 2017-08-08 – 2017-08-09 (×2): via ORAL

## 2017-08-07 MED ORDER — ALBUTEROL SULFATE 0.083 % (0.83 MG/ML) SOLN FOR INHALATION
2.5 mg /3 mL (0.083 %) | RESPIRATORY_TRACT | Status: DC
Start: 2017-08-07 — End: 2017-08-07

## 2017-08-07 MED ORDER — IPRATROPIUM-ALBUTEROL 2.5 MG-0.5 MG/3 ML NEB SOLUTION
2.5 mg-0.5 mg/3 ml | RESPIRATORY_TRACT | Status: AC
Start: 2017-08-07 — End: 2017-08-06
  Administered 2017-08-07: 02:00:00 via RESPIRATORY_TRACT

## 2017-08-07 MED ORDER — GUAIFENESIN 600 MG TABLET,EXTENDED RELEASE BIPHASIC 12 HR
600 mg | Freq: Two times a day (BID) | ORAL | Status: DC
Start: 2017-08-07 — End: 2017-08-09
  Administered 2017-08-07 – 2017-08-09 (×5): via ORAL

## 2017-08-07 MED ORDER — ACETAMINOPHEN (TYLENOL) SOLUTION 32MG/ML
ORAL | Status: DC | PRN
Start: 2017-08-07 — End: 2017-08-09

## 2017-08-07 MED ORDER — TIOTROPIUM BROMIDE 2.5 MCG/ACTUATION MIST FOR INHALATION
2.5 mcg/actuation | Freq: Every day | RESPIRATORY_TRACT | Status: DC
Start: 2017-08-07 — End: 2017-08-09
  Administered 2017-08-07 – 2017-08-09 (×3): via RESPIRATORY_TRACT

## 2017-08-07 MED ORDER — INSULIN LISPRO 100 UNIT/ML INJECTION
100 unit/mL | SUBCUTANEOUS | Status: DC | PRN
Start: 2017-08-07 — End: 2017-08-09
  Administered 2017-08-07 – 2017-08-09 (×6): via SUBCUTANEOUS

## 2017-08-07 MED ORDER — ONDANSETRON (PF) 4 MG/2 ML INJECTION
4 mg/2 mL | Freq: Three times a day (TID) | INTRAMUSCULAR | Status: DC | PRN
Start: 2017-08-07 — End: 2017-08-09

## 2017-08-07 MED ORDER — IPRATROPIUM-ALBUTEROL 2.5 MG-0.5 MG/3 ML NEB SOLUTION
2.5 mg-0.5 mg/3 ml | RESPIRATORY_TRACT | Status: DC
Start: 2017-08-07 — End: 2017-08-07
  Administered 2017-08-07 (×2): via RESPIRATORY_TRACT

## 2017-08-07 MED ORDER — SODIUM CHLORIDE 0.9 % IV PIGGY BACK
3.375 gram | Freq: Three times a day (TID) | INTRAVENOUS | Status: DC
Start: 2017-08-07 — End: 2017-08-07
  Administered 2017-08-07: 10:00:00 via INTRAVENOUS

## 2017-08-07 MED ORDER — SODIUM CHLORIDE 0.9 % IV
10 gram | Freq: Once | INTRAVENOUS | Status: AC
Start: 2017-08-07 — End: 2017-08-07
  Administered 2017-08-07: 10:00:00 via INTRAVENOUS

## 2017-08-07 MED ORDER — FLUTICASONE 200 MCG-VILANTEROL 25 MCG/DOSE BREATH ACTIVATED INHALER
200-25 mcg/dose | Freq: Every day | RESPIRATORY_TRACT | Status: DC
Start: 2017-08-07 — End: 2017-08-09
  Administered 2017-08-07 – 2017-08-09 (×3): via RESPIRATORY_TRACT

## 2017-08-07 MED ORDER — POLYETHYLENE GLYCOL 3350 17 GRAM (100 %) ORAL POWDER PACKET
17 gram | Freq: Once | ORAL | Status: AC
Start: 2017-08-07 — End: 2017-08-07
  Administered 2017-08-07: 12:00:00 via ORAL

## 2017-08-07 MED ORDER — GLUCAGON 1 MG INJECTION
1 mg | INTRAMUSCULAR | Status: DC | PRN
Start: 2017-08-07 — End: 2017-08-09

## 2017-08-07 MED ORDER — ELECTROLYTE REPLACEMENT PROTOCOL
Status: DC | PRN
Start: 2017-08-07 — End: 2017-08-08

## 2017-08-07 MED ORDER — ALBUTEROL SULFATE 0.083 % (0.83 MG/ML) SOLN FOR INHALATION
2.5 mg /3 mL (0.083 %) | RESPIRATORY_TRACT | Status: DC | PRN
Start: 2017-08-07 — End: 2017-08-09

## 2017-08-07 MED ORDER — MORPHINE 2 MG/ML INJECTION
2 mg/mL | INTRAMUSCULAR | Status: AC
Start: 2017-08-07 — End: 2017-08-07
  Administered 2017-08-07: 15:00:00 via INTRAVENOUS

## 2017-08-07 MED ORDER — IPRATROPIUM-ALBUTEROL 2.5 MG-0.5 MG/3 ML NEB SOLUTION
2.5 mg-0.5 mg/3 ml | Freq: Four times a day (QID) | RESPIRATORY_TRACT | Status: DC | PRN
Start: 2017-08-07 — End: 2017-08-07

## 2017-08-07 MED ORDER — MAGNESIUM OXIDE 400 MG TAB
400 mg | Freq: Every day | ORAL | Status: DC
Start: 2017-08-07 — End: 2017-08-07

## 2017-08-07 MED ORDER — ONDANSETRON 4 MG TAB, RAPID DISSOLVE
4 mg | Freq: Three times a day (TID) | ORAL | Status: DC | PRN
Start: 2017-08-07 — End: 2017-08-09

## 2017-08-07 MED ORDER — METHYLPREDNISOLONE (PF) 40 MG/ML IJ SOLR
40 mg/mL | Freq: Four times a day (QID) | INTRAMUSCULAR | Status: DC
Start: 2017-08-07 — End: 2017-08-08
  Administered 2017-08-07 – 2017-08-08 (×6): via INTRAVENOUS

## 2017-08-07 MED ORDER — INSULIN GLARGINE 100 UNIT/ML INJECTION
100 unit/mL | Freq: Every evening | SUBCUTANEOUS | Status: DC
Start: 2017-08-07 — End: 2017-08-09
  Administered 2017-08-08 – 2017-08-09 (×2): via SUBCUTANEOUS

## 2017-08-07 MED ORDER — NALOXONE 0.4 MG/ML INJECTION
0.4 mg/mL | INTRAMUSCULAR | Status: DC | PRN
Start: 2017-08-07 — End: 2017-08-09

## 2017-08-07 MED ORDER — PHARMACY VANCOMYCIN NOTE
Status: DC
Start: 2017-08-07 — End: 2017-08-07

## 2017-08-07 MED ORDER — METHYLPREDNISOLONE (PF) 125 MG/2 ML IJ SOLR
125 mg/2 mL | Freq: Once | INTRAMUSCULAR | Status: AC
Start: 2017-08-07 — End: 2017-08-06
  Administered 2017-08-07: 02:00:00 via INTRAVENOUS

## 2017-08-07 MED ORDER — ROSUVASTATIN 10 MG TAB
10 mg | Freq: Every evening | ORAL | Status: DC
Start: 2017-08-07 — End: 2017-08-09
  Administered 2017-08-08 – 2017-08-09 (×2): via ORAL

## 2017-08-07 MED ORDER — IPRATROPIUM-ALBUTEROL 2.5 MG-0.5 MG/3 ML NEB SOLUTION
2.5 mg-0.5 mg/3 ml | RESPIRATORY_TRACT | Status: AC
Start: 2017-08-07 — End: 2017-08-06
  Administered 2017-08-07: 01:00:00 via RESPIRATORY_TRACT

## 2017-08-07 MED ORDER — SODIUM CHLORIDE 0.9 % IV
10010 mg/mL (10%) | Freq: Once | INTRAVENOUS | Status: AC
Start: 2017-08-07 — End: 2017-08-07
  Administered 2017-08-07: 13:00:00 via INTRAVENOUS

## 2017-08-07 MED ORDER — ALBUTEROL SULFATE 0.083 % (0.83 MG/ML) SOLN FOR INHALATION
2.5 mg /3 mL (0.083 %) | RESPIRATORY_TRACT | Status: DC | PRN
Start: 2017-08-07 — End: 2017-08-07

## 2017-08-07 MED ORDER — ACETAMINOPHEN 325 MG TABLET
325 mg | Freq: Four times a day (QID) | ORAL | Status: DC | PRN
Start: 2017-08-07 — End: 2017-08-07

## 2017-08-07 MED ORDER — SODIUM POLYSTYRENE SULFONATE 15 GRAM/60 ML ORAL SUSPENSION
15 gram/60 mL | Freq: Once | ORAL | Status: AC
Start: 2017-08-07 — End: 2017-08-07
  Administered 2017-08-07: 12:00:00 via ORAL

## 2017-08-07 MED ORDER — ELECTROLYTE REPLACEMENT PROTOCOL
Status: DC | PRN
Start: 2017-08-07 — End: 2017-08-07

## 2017-08-07 MED ORDER — INSULIN LISPRO 100 UNIT/ML INJECTION
100 unit/mL | Freq: Four times a day (QID) | SUBCUTANEOUS | Status: DC
Start: 2017-08-07 — End: 2017-08-09
  Administered 2017-08-07 – 2017-08-09 (×8): via SUBCUTANEOUS

## 2017-08-07 MED ORDER — SPIRONOLACTONE 100 MG TAB
100 mg | Freq: Every day | ORAL | Status: DC
Start: 2017-08-07 — End: 2017-08-07

## 2017-08-07 MED ORDER — ACETAMINOPHEN 325 MG TABLET
325 mg | ORAL | Status: DC | PRN
Start: 2017-08-07 — End: 2017-08-09
  Administered 2017-08-07 – 2017-08-08 (×3): via ORAL

## 2017-08-07 MED ORDER — FLUTICASONE-SALMETEROL 115 MCG-21 MCG/ACTUATION AEROSOL INHALER
115-21 mcg/actuation | Freq: Two times a day (BID) | RESPIRATORY_TRACT | Status: DC
Start: 2017-08-07 — End: 2017-08-07

## 2017-08-07 MED ORDER — SODIUM CHLORIDE 0.9% BOLUS IV
0.9 % | INTRAVENOUS | Status: AC
Start: 2017-08-07 — End: 2017-08-07
  Administered 2017-08-07: 02:00:00 via INTRAVENOUS

## 2017-08-07 MED ORDER — BENZONATATE 100 MG CAP
100 mg | Freq: Three times a day (TID) | ORAL | Status: DC | PRN
Start: 2017-08-07 — End: 2017-08-09
  Administered 2017-08-08: 02:00:00 via ORAL

## 2017-08-07 MED ORDER — SODIUM CHLORIDE 0.9 % IV
100 mg | Freq: Two times a day (BID) | INTRAVENOUS | Status: DC
Start: 2017-08-07 — End: 2017-08-09
  Administered 2017-08-07 – 2017-08-09 (×4): via INTRAVENOUS

## 2017-08-07 MED ORDER — ACETAMINOPHEN 325 MG RECTAL SUPPOSITORY
325 mg | RECTAL | Status: DC | PRN
Start: 2017-08-07 — End: 2017-08-09

## 2017-08-07 MED ORDER — LACTATED RINGERS IV
INTRAVENOUS | Status: DC
Start: 2017-08-07 — End: 2017-08-08
  Administered 2017-08-07 – 2017-08-08 (×2): via INTRAVENOUS

## 2017-08-07 MED ORDER — IOPAMIDOL 76 % IV SOLN
370 mg iodine /mL (76 %) | Freq: Once | INTRAVENOUS | Status: AC
Start: 2017-08-07 — End: 2017-08-07

## 2017-08-07 MED ORDER — SODIUM CHLORIDE 0.9% BOLUS IV
0.9 % | INTRAVENOUS | Status: AC
Start: 2017-08-07 — End: 2017-08-07
  Administered 2017-08-07: 08:00:00 via INTRAVENOUS

## 2017-08-07 MED ORDER — DEXTROSE 50% IN WATER (D50W) IV
INTRAVENOUS | Status: DC | PRN
Start: 2017-08-07 — End: 2017-08-09

## 2017-08-07 MED ORDER — AMLODIPINE 5 MG TAB
5 mg | Freq: Every day | ORAL | Status: DC
Start: 2017-08-07 — End: 2017-08-09
  Administered 2017-08-08 – 2017-08-09 (×2): via ORAL

## 2017-08-07 MED ORDER — MORPHINE IN NS 1 MG/ML INFUSION
1 mg/mL | INTRAVENOUS | Status: DC
Start: 2017-08-07 — End: 2017-08-07
  Administered 2017-08-07: 13:00:00 via INTRAVENOUS

## 2017-08-07 MED ORDER — SODIUM CHLORIDE 0.9 % IJ SYRG
Freq: Once | INTRAMUSCULAR | Status: AC
Start: 2017-08-07 — End: 2017-08-07
  Administered 2017-08-07: 07:00:00 via INTRAVENOUS

## 2017-08-07 MED ORDER — QUETIAPINE SR 50 MG 24 HR TAB
50 mg | Freq: Every evening | ORAL | Status: DC
Start: 2017-08-07 — End: 2017-08-09
  Administered 2017-08-08 – 2017-08-09 (×2): via ORAL

## 2017-08-07 MED ORDER — MONTELUKAST 10 MG TAB
10 mg | Freq: Every evening | ORAL | Status: DC
Start: 2017-08-07 — End: 2017-08-07

## 2017-08-07 MED ORDER — NABUMETONE 500 MG TAB
500 mg | Freq: Two times a day (BID) | ORAL | Status: DC | PRN
Start: 2017-08-07 — End: 2017-08-09

## 2017-08-07 MED ORDER — ROSUVASTATIN 10 MG TAB
10 mg | Freq: Every evening | ORAL | Status: DC
Start: 2017-08-07 — End: 2017-08-07

## 2017-08-07 MED ORDER — SPIRONOLACTONE 25 MG TAB
25 mg | Freq: Every day | ORAL | Status: DC
Start: 2017-08-07 — End: 2017-08-09
  Administered 2017-08-08 – 2017-08-09 (×2): via ORAL

## 2017-08-07 MED ORDER — SODIUM BICARBONATE 8.4 % (1 MEQ/ML) IV SYRG
8.4 % (1 mEq/mL) | INTRAVENOUS | Status: AC
Start: 2017-08-07 — End: 2017-08-07
  Administered 2017-08-07: 13:00:00 via INTRAVENOUS

## 2017-08-07 MED ORDER — SODIUM CHLORIDE 0.9% BOLUS IV
0.9 % | INTRAVENOUS | Status: AC
Start: 2017-08-07 — End: 2017-08-07
  Administered 2017-08-07: 07:00:00 via INTRAVENOUS

## 2017-08-07 MED ORDER — ENOXAPARIN 40 MG/0.4 ML SUB-Q SYRINGE
40 mg/0.4 mL | SUBCUTANEOUS | Status: DC
Start: 2017-08-07 — End: 2017-08-09
  Administered 2017-08-07 – 2017-08-09 (×3): via SUBCUTANEOUS

## 2017-08-07 MED ORDER — POLYETHYLENE GLYCOL 3350 17 GRAM (100 %) ORAL POWDER PACKET
17 gram | Freq: Every day | ORAL | Status: DC
Start: 2017-08-07 — End: 2017-08-07

## 2017-08-07 MED ORDER — PANTOPRAZOLE 40 MG IV SOLR
40 mg | Freq: Every day | INTRAVENOUS | Status: DC
Start: 2017-08-07 — End: 2017-08-09
  Administered 2017-08-07 – 2017-08-09 (×3): via INTRAVENOUS

## 2017-08-07 MED ORDER — TRAZODONE 50 MG TAB
50 mg | Freq: Every evening | ORAL | Status: DC | PRN
Start: 2017-08-07 — End: 2017-08-09

## 2017-08-07 MED ORDER — SODIUM CHLORIDE 0.9 % IV
INTRAVENOUS | Status: DC
Start: 2017-08-07 — End: 2017-08-07
  Administered 2017-08-07 (×2): via INTRAVENOUS

## 2017-08-07 MED ORDER — DOXYCYCLINE HYCLATE 100 MG IV SOLUTION
100 mg | INTRAVENOUS | Status: AC
Start: 2017-08-07 — End: 2017-08-07
  Administered 2017-08-07: 07:00:00 via INTRAVENOUS

## 2017-08-07 MED ORDER — LACTATED RINGERS BOLUS IV
Freq: Once | INTRAVENOUS | Status: AC
Start: 2017-08-07 — End: 2017-08-07
  Administered 2017-08-07: 22:00:00 via INTRAVENOUS

## 2017-08-07 MED ORDER — ALBUTEROL SULFATE 0.083 % (0.83 MG/ML) SOLN FOR INHALATION
2.5 mg /3 mL (0.083 %) | Freq: Four times a day (QID) | RESPIRATORY_TRACT | Status: DC
Start: 2017-08-07 — End: 2017-08-09
  Administered 2017-08-07 – 2017-08-09 (×7): via RESPIRATORY_TRACT

## 2017-08-07 MED FILL — ELECTROLYTE REPLACEMENT PROTOCOL: Qty: 1

## 2017-08-07 MED FILL — BREO ELLIPTA 200 MCG-25 MCG/DOSE POWDER FOR INHALATION: 200-25 mcg/dose | RESPIRATORY_TRACT | Qty: 28

## 2017-08-07 MED FILL — IPRATROPIUM-ALBUTEROL 2.5 MG-0.5 MG/3 ML NEB SOLUTION: 2.5 mg-0.5 mg/3 ml | RESPIRATORY_TRACT | Qty: 3

## 2017-08-07 MED FILL — ALBUTEROL SULFATE 0.083 % (0.83 MG/ML) SOLN FOR INHALATION: 2.5 mg /3 mL (0.083 %) | RESPIRATORY_TRACT | Qty: 1

## 2017-08-07 MED FILL — VANCOMYCIN 10 GRAM IV SOLR: 10 gram | INTRAVENOUS | Qty: 2000

## 2017-08-07 MED FILL — PIPERACILLIN-TAZOBACTAM 3.375 GRAM IV SOLR: 3.375 gram | INTRAVENOUS | Qty: 3.38

## 2017-08-07 MED FILL — SOLU-MEDROL (PF) 40 MG/ML SOLUTION FOR INJECTION: 40 mg/mL | INTRAMUSCULAR | Qty: 1

## 2017-08-07 MED FILL — SPIRONOLACTONE 100 MG TAB: 100 mg | ORAL | Qty: 1

## 2017-08-07 MED FILL — DOXY-100 100 MG INTRAVENOUS SOLUTION: 100 mg | INTRAVENOUS | Qty: 100

## 2017-08-07 MED FILL — ENOXAPARIN 40 MG/0.4 ML SUB-Q SYRINGE: 40 mg/0.4 mL | SUBCUTANEOUS | Qty: 0.4

## 2017-08-07 MED FILL — MORPHINE IN NS 1 MG/ML INFUSION: 1 mg/mL | INTRAVENOUS | Qty: 100

## 2017-08-07 MED FILL — SPIRIVA RESPIMAT 2.5 MCG/ACTUATION SOLUTION FOR INHALATION: 2.5 mcg/actuation | RESPIRATORY_TRACT | Qty: 4

## 2017-08-07 MED FILL — ACETAMINOPHEN 325 MG TABLET: 325 mg | ORAL | Qty: 2

## 2017-08-07 MED FILL — MAGNESIUM OXIDE 400 MG TAB: 400 mg | ORAL | Qty: 1

## 2017-08-07 MED FILL — SOLU-MEDROL (PF) 125 MG/2 ML SOLUTION FOR INJECTION: 125 mg/2 mL | INTRAMUSCULAR | Qty: 2

## 2017-08-07 MED FILL — MUCINEX 600 MG TABLET, EXTENDED RELEASE: 600 mg | ORAL | Qty: 1

## 2017-08-07 MED FILL — CALCIUM GLUCONATE 100 MG/ML (10%) IV SOLN: 100 mg/mL (10%) | INTRAVENOUS | Qty: 10

## 2017-08-07 MED FILL — LACTATED RINGERS IV: INTRAVENOUS | Qty: 1000

## 2017-08-07 MED FILL — PANTOPRAZOLE 40 MG IV SOLR: 40 mg | INTRAVENOUS | Qty: 40

## 2017-08-07 MED FILL — SODIUM BICARBONATE 8.4 % (1 MEQ/ML) IV SYRG: 8.4 % (1 mEq/mL) | INTRAVENOUS | Qty: 50

## 2017-08-07 MED FILL — SODIUM POLYSTYRENE SULFONATE (SORBITOL FREE) 15 GRAM/60 ML ORAL SUSP: 15 gram/60 mL | ORAL | Qty: 120

## 2017-08-07 MED FILL — ISOVUE-370  76 % INTRAVENOUS SOLUTION: 370 mg iodine /mL (76 %) | INTRAVENOUS | Qty: 85

## 2017-08-07 MED FILL — PHARMACY VANCOMYCIN NOTE: Qty: 1

## 2017-08-07 MED FILL — MORPHINE 2 MG/ML INJECTION: 2 mg/mL | INTRAMUSCULAR | Qty: 1

## 2017-08-07 MED FILL — POLYETHYLENE GLYCOL 3350 17 GRAM (100 %) ORAL POWDER PACKET: 17 gram | ORAL | Qty: 1

## 2017-08-07 NOTE — ED Notes (Signed)
TRANSFER - OUT REPORT:    Verbal report given to Gabby RN(name) on [name] [name]  being transferred to [facility](unit) for routine progression of care       Report consisted of patient???s Situation, Background, Assessment and   Recommendations(SBAR).     Information from the following report(s) SBAR was reviewed with the receiving nurse.    Lines:   Peripheral IV 08/07/17 Left Forearm (Active)        Opportunity for questions and clarification was provided.      Patient transported with:   Registered Nurse  Tech

## 2017-08-07 NOTE — Progress Notes (Signed)
INTERNAL MEDICINE PROGRESS NOTE    Date of note:      August 07, 2017    Patient:               [name] [name], 67 y.o., female  Admit Date:        08/06/2017  Length of Stay:  0 day(s)    Overnight Events:      Reviewed.    Subjective:     Patient seen and examined at bedside.     Patient states she feels much better after getting inhaler therapy and steroids.  She still has a cough but states she feels "night and day" compared to yesterday.  She denies any nausea, vomiting, abdominal pain, diarrhea, or constipation.    Assessment:      1. Acute hypoxic respiratory failure  2. COPD exacerbation with bullous emphysema  3. Lactic acidosis secondary to multiple breathing treatments  4. Acute renal failure  5. Hyperkalemia secondary to acute renal failure  6. SIRS.  No evidence of pneumonia on CT chest  7. Non-insulin-dependent type 2 diabetes, uncontrolled, with hyperglycemia.  Hemoglobin A1c 9.5  8. Chronic normocytic anemia with baseline hemoglobin between 12-13    Plan:   ?? Continue albuterol nebulizer and other inhalers.  Continue Solu-Medrol.  Continue doxycycline  ?? Wean SaO2 > 88%  ?? No evidence of pneumonia on examination or CT chest.  I stopped other antibiotics.  ?? Lactic acidosis likely related to multiple breathing treatments.  Does not appear acutely infected.  No evidence of ischemia.  Monitor for now.  ?? I did order 1 L IV fluid bolus and maintenance IV fluids at this time for likely dehydration with acute renal failure  ?? Status post calcium gluconate due to hyperkalemia.  Monitor for now  ?? Initiate Glucomander protocol     Disposition:     Discharge in 2-3 days     Reviewed: Vital signs/Labs/Imaging/Micro/Available consultation and progress notes  Discussed Plan with: Patient, nurse     Hospital Course:     Patient is a 67 year old female with known emphysema who comes into the hospital with acute shortness of breath, cough, congestion.  She was found  to have COPD exacerbation and started on IV steroids, breathing treatments, and doxycycline.  She also had acute renal failure likely secondary to dehydration and was given IV fluids.    Objective:     Visit Vitals  BP 127/74   Pulse (!) 127   Temp 98.4 ??F (36.9 ??C)   Resp 22   Ht '5\' 8"'$  (1.727 m)   Wt 93 kg (205 lb)   SpO2 96%   BMI 31.17 kg/m??       Intake/Output Summary (Last 24 hours) at 08/07/2017 1304  Last data filed at 08/07/2017 1107  Gross per 24 hour   Intake 3600 ml   Output 1050 ml   Net 2550 ml     General: NAD, alert and oriented x 3  Head/Face: Normocephalic, atraumatic  Neck: Supple, no JVD  Eyes: no scleral icterus, no conjunctival pallor   Cardiac: Regular rate and rhythm. S1, S2 heard. No murmurs or rubs noted  Chest: Symmetrical chest rise  Respiratory: Poor inspiratory/expiratory volumes with prolonged expiration. Rhonchi heard in bilateral lower lung fields   Abdomen: soft, non-tender, non-distended. Normal bowel sounds  Extremities: No edema or erythema noted   Skin: No rashes or lesions noted  Back: No focal back tenderness on palpation. No gross bony deformities noted  Neuro: No focal neurological deficits noted; cranial nerves grossly intact       Current Medications:     Current Facility-Administered Medications:   ???  doxycycline (VIBRAMYCIN) 100 mg in 0.9% sodium chloride 250 mL IVPB, 100 mg, IntraVENous, Q12H, Digeronimo, Verlan Friends, PA-C, Stopped at 08/07/17 0155  ???  0.9% sodium chloride infusion, 125 mL/hr, IntraVENous, CONTINUOUS, [name], Derrek Gu, MD, Last Rate: 125 mL/hr at 08/07/17 0700, 125 mL/hr at 08/07/17 0700  ???  methylPREDNISolone (PF) (SOLU-MEDROL) injection 40 mg, 40 mg, IntraVENous, Q6H, Knox Saliva, MD, 40 mg at 08/07/17 3710  ???  naloxone (NARCAN) injection 0.1 mg, 0.1 mg, IntraVENous, PRN, [name], Derrek Gu, MD  ???  ELECTROLYTE REPLACEMENT PROTOCOL, 1 Each, Other, PRN, [name], Derrek Gu, MD  ???  ELECTROLYTE REPLACEMENT PROTOCOL, 1 Each, Other, PRN, [name], Derrek Gu, MD   ???  ELECTROLYTE REPLACEMENT PROTOCOL, 1 Each, Other, PRN, [name], Derrek Gu, MD  ???  enoxaparin (LOVENOX) injection 40 mg, 40 mg, SubCUTAneous, Q24H, [name], Derrek Gu, MD, 40 mg at 08/07/17 6269  ???  albuterol (PROVENTIL VENTOLIN) nebulizer solution 1.25 mg, 1.25 mg, Nebulization, Q2H PRN, Knox Saliva, MD  ???  pantoprazole (PROTONIX) 40 mg in sodium chloride 0.9% 10 mL injection, 40 mg, IntraVENous, DAILY, [name], Derrek Gu, MD, 40 mg at 08/07/17 0951  ???  dextrose (D50) infusion 5-25 g, 10-50 mL, IntraVENous, PRN, Einar Grad, MD  ???  glucagon (GLUCAGEN) injection 1 mg, 1 mg, IntraMUSCular, PRN, Einar Grad, MD  ???  insulin glargine (LANTUS) injection 1-100 Units, 1-100 Units, SubCUTAneous, QHS, Einar Grad, MD  ???  insulin lispro (HUMALOG) injection 1-100 Units, 1-100 Units, SubCUTAneous, AC&HS, Einar Grad, MD, 12 Units at 08/07/17 1145  ???  insulin lispro (HUMALOG) injection 1-100 Units, 1-100 Units, SubCUTAneous, PRN, Einar Grad, MD  ???  guaiFENesin ER (MUCINEX) tablet 600 mg, 600 mg, Oral, BID, Einar Grad, MD, 600 mg at 08/07/17 0951  ???  albuterol (PROVENTIL VENTOLIN) nebulizer solution 2.5 mg, 2.5 mg, Nebulization, Q6H RT, Einar Grad, MD, Stopped at 08/07/17 0859  ???  tiotropium bromide (SPIRIVA RESPIMAT) 2.5 mcg /actuation, 1 [place], Inhalation, DAILY, Einar Grad, MD, 1 Puff at 08/07/17 1134  ???  fluticasone-vilanterol (BREO ELLIPTA) 238mg-25mcg/puff, 1 Puff, Inhalation, DAILY, PEinar Grad MD, 1 Puff at 08/07/17 1135  ???  acetaminophen (TYLENOL) tablet 650 mg, 650 mg, Oral, Q4H PRN, 650 mg at 08/07/17 1212 **OR** acetaminophen (TYLENOL) solution 650 mg, 650 mg, Oral, Q4H PRN **OR** acetaminophen (TYLENOL) suppository 650 mg, 650 mg, Rectal, Q4H PRN, PEinar Grad MD  ???  ondansetron (ZOFRAN ODT) tablet 4 mg, 4 mg, Oral, Q8H PRN **OR** ondansetron (ZOFRAN) injection 4 mg, 4 mg, IntraVENous, Q8H PRN, PEinar Grad MD  ???  benzonatate (TESSALON) capsule 100 mg, 100 mg, Oral, TID PRN, PEinar Grad MD     Current Outpatient Medications:   ???  traZODone (DESYREL) 100 mg tablet, TAKE 1 TABLET BY MOUTH EVERY NIGHT, Disp: 90 Tab, Rfl: 0  ???  nabumetone (RELAFEN) 750 mg tablet, TAKE 1 TABLET BY MOUTH TWICE DAILY AS NEEDED FOR PAIN. USE SPARINGLY, Disp: 30 Tab, Rfl: 0  ???  losartan (COZAAR) 100 mg tablet, TAKE 1 TABLET BY MOUTH DAILY FOR HYPERTENSION, Disp: 90 Tab, Rfl: 0  ???  QUEtiapine SR (SEROQUEL XR) 150 mg sr tablet, TAKE 1 TABLET BY MOUTH NIGHTLY, Disp: 90 Tab, Rfl: 1  ???  traZODone (DESYREL) 100 mg tablet, TAKE 1 TABLET BY MOUTH NIGHTLY, Disp: 90 Tab, Rfl: 1  ???  amLODIPine (NORVASC) 10 mg tablet, TAKE ONE TABLET BY MOUTH ONCE DAILY, Disp: 90 Tab, Rfl: 1  ???  rosuvastatin (CRESTOR) 5 mg tablet, TAKE 1 TABLET BY MOUTH NIGHTLY, Disp: 90 Tab, Rfl: 1  ???  metFORMIN ER (GLUCOPHAGE XR) 500 mg tablet, 2 tabs BID, Disp: 360 Tab, Rfl: 1  ???  albuterol (ACCUNEB) 1.25 mg/3 mL nebu, 3 mL by Nebulization route every six (6) hours as needed., Disp: 100 Each, Rfl: 0  ???  spironolactone (ALDACTONE) 100 mg tablet, TAKE 1 TABLET BY MOUTH DAILY, Disp: 90 Tab, Rfl: 0  ???  polyethylene glycol (MIRALAX) 17 gram packet, Take 1 Packet by mouth daily., Disp: 100 Packet, Rfl: 1  ???  magnesium oxide (MAG-OX) 400 mg tablet, TAKE 1 TABLET BY MOUTH DAILY, Disp: 90 Tab, Rfl: 0  ???  VENTOLIN HFA 90 mcg/actuation inhaler, INHALE 2 PUFFS BY MOUTH EVERY 4 HOURS AS NEEDED FOR WHEEZING, Disp: 1 Inhaler, Rfl: 2  ???  triamcinolone (NASACORT) 55 mcg nasal inhaler, 2 Sprays by Both Nostrils route daily., Disp: 1 Bottle, Rfl: 0  ???  montelukast (SINGULAIR) 10 mg tablet, TAKE 1 TABLET BY MOUTH DAILY, Disp: 90 Tab, Rfl: 1  ???  benzonatate (TESSALON PERLES) 100 mg capsule, Take 1 Cap by mouth three (3) times daily as needed for Cough., Disp: 20 Cap, Rfl: 0  ???  Blood-Glucose Meter monitoring kit, Check sugars 2 times per day, fasting and 2 hours after dinner, Disp: 1 Kit, Rfl: 0  ???  Lancets misc, Check sugars twice daily, Disp: 200 Each, Rfl: 2   ???  glucose blood VI test strips (ASCENSIA AUTODISC VI, ONE TOUCH ULTRA TEST VI) strip, Check sugars twice daily, Disp: 100 Strip, Rfl: 5  Labs:     Recent Results (from the past 24 hour(s))   EKG, 12 LEAD, INITIAL    Collection Time: 08/06/17  8:27 PM   Result Value Ref Range    Ventricular Rate 132 BPM    Atrial Rate 132 BPM    P-R Interval 156 ms    QRS Duration 62 ms    Q-T Interval 270 ms    QTC Calculation (Bezet) 400 ms    Calculated P Axis 68 degrees    Calculated R Axis 48 degrees    Calculated T Axis 61 degrees    Diagnosis       Sinus tachycardia  Otherwise normal ECG  When compared with ECG of 15-Jun-2017 21:21,  No significant change was found  Confirmed by Marrion Coy, M.D., Ellin Saba. ([id number]) on 08/07/2017 8:20:33 AM     CBC WITH AUTOMATED DIFF    Collection Time: 08/06/17  8:50 PM   Result Value Ref Range    WBC 19.6 (H) 4.0 - 11.0 1000/mm3    RBC 4.34 3.60 - 5.20 M/uL    HGB 12.4 (L) 13.0 - 17.2 gm/dl    HCT 39.0 37.0 - 50.0 %    MCV 89.9 80.0 - 98.0 fL    MCH 28.6 25.4 - 34.6 pg    MCHC 31.8 30.0 - 36.0 gm/dl    PLATELET 371 140 - 450 1000/mm3    MPV 11.8 (H) 6.0 - 10.0 fL    RDW-SD 41.6 36.4 - 46.3      NRBC 0 0 - 0      IMMATURE GRANULOCYTES 0.5 0.0 - 3.0 %    NEUTROPHILS 77.9 (H) 34 - 64 %    LYMPHOCYTES 16.0 (L) 28 - 48 %    MONOCYTES 5.1  1 - 13 %    EOSINOPHILS 0.2 0 - 5 %    BASOPHILS 0.3 0 - 3 %   METABOLIC PANEL, COMPREHENSIVE    Collection Time: 08/06/17  8:50 PM   Result Value Ref Range    Sodium 134 (L) 136 - 145 mEq/L    Potassium 5.5 (H) 3.5 - 5.1 mEq/L    Chloride 103 98 - 107 mEq/L    CO2 21 21 - 32 mEq/L    Glucose 204 (H) 74 - 106 mg/dl    BUN 31 (H) 7 - 25 mg/dl    Creatinine 1.8 (H) 0.6 - 1.3 mg/dl    GFR est AA 36.0      GFR est non-AA 30      Calcium 9.7 8.5 - 10.1 mg/dl    AST (SGOT) 11 (L) 15 - 37 U/L    ALT (SGPT) 14 12 - 78 U/L    Alk. phosphatase 86 45 - 117 U/L    Bilirubin, total 0.4 0.2 - 1.0 mg/dl    Protein, total 9.0 (H) 6.4 - 8.2 gm/dl    Albumin 3.9 3.4 - 5.0 gm/dl     Anion gap 11 5 - 15 mmol/L   TROPONIN I    Collection Time: 08/06/17  8:50 PM   Result Value Ref Range    Troponin-I <0.015 0.000 - 0.045 ng/ml   NT-PRO BNP    Collection Time: 08/06/17  8:50 PM   Result Value Ref Range    NT pro-BNP 31.0 0.0 - 125.0 pg/ml   INFLUENZA A/B, BY PCR    Collection Time: 08/06/17  9:04 PM   Result Value Ref Range    Influenza A PCR NEGATIVE NEGATIVE      Influenza B PCR NEGATIVE NEGATIVE     LACTIC ACID    Collection Time: 08/07/17  1:00 AM   Result Value Ref Range    Lactic Acid 4.4 (HH) 0.4 - 2.0 mmol/L   PROCALCITONIN    Collection Time: 08/07/17  1:00 AM   Result Value Ref Range    PROCALCITONIN <0.05 0.00 - 0.50 ng/ml   CULTURE, BLOOD    Collection Time: 08/07/17  1:00 AM   Result Value Ref Range    Blood Culture Result Culture In Progress, Daily Updates To Follow     CULTURE, BLOOD    Collection Time: 08/07/17  1:00 AM   Result Value Ref Range    Blood Culture Result Culture In Progress, Daily Updates To Follow     METABOLIC PANEL, COMPREHENSIVE    Collection Time: 08/07/17  3:25 AM   Result Value Ref Range    Sodium 135 (L) 136 - 145 mEq/L    Potassium 6.3 (HH) 3.5 - 5.1 mEq/L    Chloride 104 98 - 107 mEq/L    CO2 20 (L) 21 - 32 mEq/L    Glucose 308 (H) 74 - 106 mg/dl    BUN 28 (H) 7 - 25 mg/dl    Creatinine 1.8 (H) 0.6 - 1.3 mg/dl    GFR est AA 36.0      GFR est non-AA 30      Calcium 8.4 (L) 8.5 - 10.1 mg/dl    AST (SGOT) 4 (L) 15 - 37 U/L    ALT (SGPT) 11 (L) 12 - 78 U/L    Alk. phosphatase 73 45 - 117 U/L    Bilirubin, total 0.3 0.2 - 1.0 mg/dl    Protein, total 7.7 6.4 - 8.2  gm/dl    Albumin 3.4 3.4 - 5.0 gm/dl    Anion gap 11 5 - 15 mmol/L   HEMOGLOBIN A1C W/O EAG    Collection Time: 08/07/17  3:25 AM   Result Value Ref Range    Hemoglobin A1c 9.5 (H) 4.2 - 6.3 %   CBC WITH AUTOMATED DIFF    Collection Time: 08/07/17  3:25 AM   Result Value Ref Range    WBC 11.1 (H) 4.0 - 11.0 1000/mm3    RBC 4.01 3.60 - 5.20 M/uL    HGB 11.9 (L) 13.0 - 17.2 gm/dl     HCT 35.4 (L) 37.0 - 50.0 %    MCV 88.3 80.0 - 98.0 fL    MCH 29.7 25.4 - 34.6 pg    MCHC 33.6 30.0 - 36.0 gm/dl    PLATELET 267 140 - 450 1000/mm3    MPV 11.3 (H) 6.0 - 10.0 fL    RDW-SD 42.1 36.4 - 46.3      NRBC 0 0 - 0      IMMATURE GRANULOCYTES 0.4 0.0 - 3.0 %    NEUTROPHILS 89.8 (H) 34 - 64 %    LYMPHOCYTES 9.2 (L) 28 - 48 %    MONOCYTES 0.5 (L) 1 - 13 %    EOSINOPHILS 0.0 0 - 5 %    BASOPHILS 0.1 0 - 3 %   PROTHROMBIN TIME + INR    Collection Time: 08/07/17  5:21 AM   Result Value Ref Range    Prothrombin time 12.9 10.2 - 12.9 seconds    INR 1.1 0.1 - 1.1     LACTIC ACID    Collection Time: 08/07/17  5:21 AM   Result Value Ref Range    Lactic Acid 5.7 (HH) 0.4 - 2.0 mmol/L   METABOLIC PANEL, BASIC    Collection Time: 08/07/17  5:21 AM   Result Value Ref Range    Sodium 136 136 - 145 mEq/L    Potassium 6.0 (H) 3.5 - 5.1 mEq/L    Chloride 106 98 - 107 mEq/L    CO2 19 (L) 21 - 32 mEq/L    Glucose 313 (H) 74 - 106 mg/dl    BUN 28 (H) 7 - 25 mg/dl    Creatinine 1.7 (H) 0.6 - 1.3 mg/dl    GFR est AA 39.0      GFR est non-AA 32      Calcium 8.5 8.5 - 10.1 mg/dl    Anion gap 11 5 - 15 mmol/L   GLUCOSE, POC    Collection Time: 08/07/17 11:32 AM   Result Value Ref Range    Glucose (POC) 277 (H) 65 - 105 mg/dL     XR Results:  Results from Hospital Encounter encounter on 08/06/17   XR CHEST PA LAT    Narrative Indication: Respiratory distress.      Impression IMPRESSION: Chronic obstructive lung disease. Left pulmonary nodules.    Comment: AP and lateral views chest reveal the lungs are hyperaerated from  chronic obstructive disease. Bullous disease is present bilaterally. Left  pulmonary nodules are again observed unchanged from June 15, 2017. The heart  is of normal size.       CT Results:  Results from [place] [facility] encounter on 08/06/17   CT CHEST WO CONT    Narrative Indication: Respiratory distress. Hypoxia. Elevated white blood count. Diabetes.  Lung nodules.       Impression IMPRESSION: Bullous emphysema. Stable left lower lobe 18 mm  nodule. Stable tiny  peripheral left upper lobe nodules. 2 cm right thyroid nodule. Tiny  nonobstructing right renal calculus.    Comment: CT images of the chest were obtained without intravenous contrast. This  was compared with Nov 03, 2016 and July 23, 2016.    A 2 cm right thyroid nodule is present.    There is diffuse bullous emphysema with large bullae bilaterally.    An 18 x 16 mm left lower lobe nodule is again seen, unchanged from prior study.  In addition there are tiny peripheral left upper lobe nodules also unchanged.    No pulmonary consolidation.    The heart is of normal size. The aorta is of normal caliber.    No pleural or pericardial effusion.    A tiny nonobstructing right renal calculus is noted.    DICOM format imaged data is available to non-affiliated external healthcare  facilities or entities on a secure, media free, reciprocally searchable basis  with patient authorization  for 12 months following the date of the study.Marland Kitchen       MRI Results:  Results from [facility] encounter on 04/07/15   MRI BRAIN W WO CONT    Narrative CLINICAL HISTORY:  Abnormal CT, meningioma    PROCEDURE: MRI brain without and with contrast    COMPARISON: Head CT dated 04/08/2015    FINDINGS:    There is age-related cerebral atrophy. There is extensive chronic small vessel  ischemic change, advanced for patient age. As noted on CT, there is a enhancing  extra-axial mass in the left CP angle along the petrous temporal bone measuring  10 mm. This is observed on image 9 series 12, image 6 series 11 and image 21  series 13. There are dural tails extending to the tentorium cerebelli. Imaging  findings would favor meningioma. No other areas of abnormal brain parenchymal  enhancement. The sella, pineal region, craniocervical junction are unremarkable.  Skull base flow voids are patent.      Impression IMPRESSION:   1. Enhancing mass in the left posterior fossa adjacent to the petrous temporal  bone as described above. Main consideration would include meningioma. Other less  likely differential considerations would include dural metastasis and lymphoma.  Consider surveillance or further evaluation.    2.  Chronic small vessel ischemic changes, advanced for patient age.       Nuclear Medicine Results:  No results found for this or any previous visit.  [place] Results:  Results from [facility] encounter on 12/22/15   US GUIDE FINE NDL ASP W IMAGE    Narrative Clinical History: Right thyroid nodule    Procedure: Ultrasound guided fine needle biopsy of  right thyroid gland nodule    Radiologist: Posey Pronto    Anesthesia: Local    Procedure:  Benefits, risks and possible complications were discussed in detail with the  patient and informed consent obtained. Patient's neck was prepped and draped in  sterile manner. Lidocaine was administered to obtain local anesthesia.    Under ultrasound guidance 18, 20 and 25 gauge hypodermic needle was advanced  into the thyroid nodule and fine needle aspirates obtained. 4 fine needle  aspirates were obtained and submitted for histopathologic evaluation.    Hemostasis was achieved with manual compression. Patient tolerated procedure  well. There were no immediate postprocedural complications.      Impression Impression:   Successful 4 fine needle aspirates of right thyroid cyst performed under  ultrasound guidance. Lesion is cystic, then may be  absence of cellularity and  results nondiagnostic, 4 mm of amber fluid aspirated. 2 attempts were made to  obtain cells from wall of the cyst.       IR Results:  No results found for this or any previous visit.  VAS/US Results:  No results found for this or any previous visit.    Total clinical care time is 25 minutes, including review of chart including all labs, radiology, past medical history, and discussion with  patient and family. Greater than 50% of my time was spent in [place] care and counseling    Portions of this electronic record were dictated using Systems analyst. Unintended errors in translation may occur.        Einar Grad, MD  [facility]  August 07, 2017  1:04 PM

## 2017-08-07 NOTE — Progress Notes (Signed)
Bedside shift change report given to Dion RN (oncoming nurse) by Abbie RN (offgoing nurse). Report included the following information SBAR, Kardex, MAR and Recent Results.

## 2017-08-07 NOTE — Progress Notes (Signed)
pt in rm [id number] [name] [name], [name] pt of [facility] allergy chocolate dx COPD w/ exac.  / AKI / dehydration  PT REQUESTING Ovid CurdAMBIEN   DIon RN [phone]

## 2017-08-07 NOTE — Progress Notes (Signed)
[name]: [name] [name]: Patients' lactic acid was 7.0 and potassium was 5.2.       [place], #[id number]

## 2017-08-07 NOTE — ED Notes (Signed)
Offered pt lunch tray.

## 2017-08-07 NOTE — H&P (Signed)
Chart reviewed.  Patient seen and examined on 08/07/2017.  Complete H&P to follow.

## 2017-08-07 NOTE — H&P (Signed)
History and Physical    DOS: August 06, 2017    Patient: [name] [name]               Sex: female          DOA: 08/06/2017       Date of Birth:  [birth year]      Age:  67 y.o.        LOS:  LOS: 0 days       MRN: [id number]       Impression/Plan       67 y.o. female with hx of HTN, GERD, COPD, and DM II with the following:       1.  Acute Hypoxic Respiratory Failure   2.  Acute COPD Exacerbation   3.  Acute Bronchitis with Bronchospasm r/o Pneumonia   4.  Sepsis   5.  SIRS with Leukocytosis and Tachycardia    6.  Acute on Chronic Renal Failure   7.  Hyperkalemia   8.  Dehydration    9.  GERD  10. DM II      PLAN:    Admit to INPT, Stepdown for critical care management and monitoring.  O2 as needed.  GI and DVT prophylaxis.  Replace electrolytes as needed.  Treat elevated potassium levels per hyperkalemia protocol.  Cultures have been sent and are pending.  Check sputum for C&S.  Trend lactic acid levels.  Continue IV antibiotics after initial doses have been given.  Adjust antibiotic therapy based on culture results and clinical response.  Handheld nebs, wean as tolerated.  IV Solu-Medrol, taper as tolerated.  Monitor blood glucose levels.  Glucomander per protocol.  Hydrate with IV fluids and monitor to avoid fluid overload.  Continue home medications when possible.  Further recommendations based on test results, response to treatment, and clinical course.      Chief Complaint:     Chief Complaint   Patient presents with   ??? Respiratory Distress       HPI:     [name] [name] is a 67 y.o. female with hx of HTN, GERD, COPD, and DM II with c/o having increased shortness of breath with associated cough and wheezing.  Patient states that she has had URI symptoms over the past 5 days that have been persistent.  Her symptoms have progressively worsened, and she is having trouble catching her breath.  She feels that this is similar to "having an asthma attack or COPD exacerbation".  She states  that her symptoms feel similar to her prior admission in January.  She also feels a sensation of chest tightness which is nonradiating but denies frank chest pain.  Her cough is minimally productive.  She denies any fever or chills.  No nausea, vomiting, or abdominal pain.  She states that she has had some loose stools today.  Her symptoms have not been relieved with home nebulizer treatments, so she had her husband bring her into the hospital today.      Past Medical History:   Diagnosis Date   ??? Arthritis    ??? Chronic pain syndrome    ??? COPD (chronic obstructive pulmonary disease) ([place])    ??? DM2 (diabetes mellitus, type 2) ([place])    ??? GERD (gastroesophageal reflux disease)    ??? Hepatitis C     HEP C   ??? HTN (hypertension)    ??? Lung nodule, multiple     (CT 10/2016) Small left upper lobe and 1.7 x 1.6  cm left lower lobe nodules not significantly changed from 07/23/2016 and 03/22/2016   ??? Marijuana use    ??? PTSD (post-traumatic stress disorder)     [place]   ??? Thoracic ascending aortic aneurysm ([place])     4.4cm noted on CT Chest (10/2016)   ??? Thyroid nodule     2.5 cm stable right thyroid nodule (CT 10/2016)       Past Surgical History:   Procedure Laterality Date   ??? HX ENDOSCOPY     ??? HX HIP REPLACEMENT Right 01/29/2015   ??? HX HIP REPLACEMENT     ??? HX HYSTERECTOMY  2010    hysterectomy       Family History   Problem Relation Age of Onset   ??? Hypertension Mother    ??? Cancer Maternal Aunt    ??? Alcohol abuse Maternal Grandfather        Social History     Socioeconomic History   ??? Marital status: MARRIED     Spouse name: Not on file   ??? Number of children: Not on file   ??? Years of education: Not on file   ??? Highest education level: Not on file   Tobacco Use   ??? Smoking status: Never Smoker   ??? Smokeless tobacco: Never Used   Substance and Sexual Activity   ??? Alcohol use: No   ??? Drug use: Yes     Types: Marijuana   ??? Sexual activity: No       Prior to Admission medications     Medication Sig Start Date End Date Taking? Authorizing Provider   traZODone (DESYREL) 100 mg tablet TAKE 1 TABLET BY MOUTH EVERY NIGHT 07/27/17   [place] Bells, MD   nabumetone (RELAFEN) 750 mg tablet TAKE 1 TABLET BY MOUTH TWICE DAILY AS NEEDED FOR PAIN. USE SPARINGLY 07/18/17   [place] Bells, MD   losartan (COZAAR) 100 mg tablet TAKE 1 TABLET BY MOUTH DAILY FOR HYPERTENSION 07/17/17   [place] Bells, MD   QUEtiapine SR (SEROQUEL XR) 150 mg sr tablet TAKE 1 TABLET BY MOUTH NIGHTLY 07/13/17   [place] Bells, MD   traZODone (DESYREL) 100 mg tablet TAKE 1 TABLET BY MOUTH NIGHTLY 07/13/17   Mike Gip K, MD   amLODIPine (NORVASC) 10 mg tablet TAKE ONE TABLET BY MOUTH ONCE DAILY 07/13/17   [place] Bells, MD   rosuvastatin (CRESTOR) 5 mg tablet TAKE 1 TABLET BY MOUTH NIGHTLY 07/13/17   [place] Bells, MD   metFORMIN ER (GLUCOPHAGE XR) 500 mg tablet 2 tabs BID 07/13/17   Tharakan, Jolson K, MD   albuterol (ACCUNEB) 1.25 mg/3 mL nebu 3 mL by Nebulization route every six (6) hours as needed. 06/18/17   Edger House, MD   spironolactone (ALDACTONE) 100 mg tablet TAKE 1 TABLET BY MOUTH DAILY 05/16/17   [place] Bells, MD   polyethylene glycol (MIRALAX) 17 gram packet Take 1 Packet by mouth daily. 05/09/17   [place] Bells, MD   magnesium oxide (MAG-OX) 400 mg tablet TAKE 1 TABLET BY MOUTH DAILY 04/24/17   Tharakan, Sonnie Alamo, MD   VENTOLIN HFA 90 mcg/actuation inhaler INHALE 2 PUFFS BY MOUTH EVERY 4 HOURS AS NEEDED FOR WHEEZING 04/17/17   [place] Bells, MD   triamcinolone (NASACORT) 55 mcg nasal inhaler 2 Sprays by Both Nostrils route daily. 02/22/17   Wallene Huh, PA-C   montelukast (SINGULAIR) 10 mg tablet TAKE 1  TABLET BY MOUTH DAILY 02/22/17   Wallene Huh, PA-C   benzonatate (TESSALON PERLES) 100 mg capsule Take 1 Cap by mouth three (3) times daily as needed for Cough. 01/09/17   Antoine Primas, PA-C   Blood-Glucose Meter monitoring kit Check sugars 2 times per day, fasting  and 2 hours after dinner 07/15/15   [place] Bells, MD   Lancets misc Check sugars twice daily 07/15/15   Lake Bells, MD   glucose blood VI test strips (ASCENSIA AUTODISC VI, ONE TOUCH ULTRA TEST VI) strip Check sugars twice daily 07/15/15   Lake Bells, MD       Allergies   Allergen Reactions   ??? Chocolate [Cocoa] Sneezing       Review of Systems:    1) See above. 2) A 12 point review of systems has been obtained. ROS is otherwise noncontributory.      Physical Exam:      Visit Vitals  BP 104/72   Pulse (!) 133   Temp 97.5 ??F (36.4 ??C)   Resp 19   Wt 93 kg (205 lb)   SpO2 98%   BMI 31.17 kg/m??       No intake or output data in the 24 hours ending 08/06/17 2349    Physical Exam:    HEENT: NC/AT, PERRLA, EOMI, dry mucous membranes  Neck: No JVD no bruits, supple, nontender, no significant LAD  LYMPH: No supraclavicular or cervical or axillary nodes on both sides  Cardiovascular: Heart, increased rate, RR, no M, R, G  Lungs: Decreased breath sounds at bases, coarse upper airway sounds, pos wheezes, scattered rhonchi, increased wob  Abd: Soft, non tender, not distended, No guarding, No rigidity, BS normal  NEURO:  Non focal, normal strength. CN II - XII intact bilaterally   Extrm: no leg edema   Skin: No rashes or lesions       Labs Reviewed:     Recent Results (from the past 24 hour(s))   CBC WITH AUTOMATED DIFF    Collection Time: 08/06/17  8:50 PM   Result Value Ref Range    WBC 19.6 (H) 4.0 - 11.0 1000/mm3    RBC 4.34 3.60 - 5.20 M/uL    HGB 12.4 (L) 13.0 - 17.2 gm/dl    HCT 39.0 37.0 - 50.0 %    MCV 89.9 80.0 - 98.0 fL    MCH 28.6 25.4 - 34.6 pg    MCHC 31.8 30.0 - 36.0 gm/dl    PLATELET 371 140 - 450 1000/mm3    MPV 11.8 (H) 6.0 - 10.0 fL    RDW-SD 41.6 36.4 - 46.3      NRBC 0 0 - 0      IMMATURE GRANULOCYTES 0.5 0.0 - 3.0 %    NEUTROPHILS 77.9 (H) 34 - 64 %    LYMPHOCYTES 16.0 (L) 28 - 48 %    MONOCYTES 5.1 1 - 13 %    EOSINOPHILS 0.2 0 - 5 %    BASOPHILS 0.3 0 - 3 %    METABOLIC PANEL, COMPREHENSIVE    Collection Time: 08/06/17  8:50 PM   Result Value Ref Range    Sodium 134 (L) 136 - 145 mEq/L    Potassium 5.5 (H) 3.5 - 5.1 mEq/L    Chloride 103 98 - 107 mEq/L    CO2 21 21 - 32 mEq/L    Glucose 204 (H) 74 - 106 mg/dl    BUN 31 (H) 7 -  25 mg/dl    Creatinine 1.8 (H) 0.6 - 1.3 mg/dl    GFR est AA 36.0      GFR est non-AA 30      Calcium 9.7 8.5 - 10.1 mg/dl    AST (SGOT) 11 (L) 15 - 37 U/L    ALT (SGPT) 14 12 - 78 U/L    Alk. phosphatase 86 45 - 117 U/L    Bilirubin, total 0.4 0.2 - 1.0 mg/dl    Protein, total 9.0 (H) 6.4 - 8.2 gm/dl    Albumin 3.9 3.4 - 5.0 gm/dl    Anion gap 11 5 - 15 mmol/L   TROPONIN I    Collection Time: 08/06/17  8:50 PM   Result Value Ref Range    Troponin-I <0.015 0.000 - 0.045 ng/ml   NT-PRO BNP    Collection Time: 08/06/17  8:50 PM   Result Value Ref Range    NT pro-BNP 31.0 0.0 - 125.0 pg/ml   INFLUENZA A/B, BY PCR    Collection Time: 08/06/17  9:04 PM   Result Value Ref Range    Influenza A PCR NEGATIVE NEGATIVE      Influenza B PCR NEGATIVE NEGATIVE         ECG:  Sinus tachycardia, vent rate:  130 bpm      Diagnostic:     Xr Chest Pa Lat    Result Date: 08/06/2017  Indication: Respiratory distress.     IMPRESSION: Chronic obstructive lung disease. Left pulmonary nodules. Comment: AP and lateral views chest reveal the lungs are hyperaerated from chronic obstructive disease. Bullous disease is present bilaterally. Left pulmonary nodules are again observed unchanged from June 15, 2017. The heart is of normal size.     Ct Chest Wo Cont    Result Date: 08/06/2017  Indication: Respiratory distress. Hypoxia. Elevated white blood count. Diabetes. Lung nodules.     IMPRESSION: Bullous emphysema. Stable left lower lobe 18 mm nodule. Stable tiny peripheral left upper lobe nodules. 2 cm right thyroid nodule. Tiny nonobstructing right renal calculus. Comment: CT images of the chest were obtained without intravenous contrast. This was compared with Nov 03, 2016  and July 23, 2016. A 2 cm right thyroid nodule is present. There is diffuse bullous emphysema with large bullae bilaterally. An 18 x 16 mm left lower lobe nodule is again seen, unchanged from prior study. In addition there are tiny peripheral left upper lobe nodules also unchanged. No pulmonary consolidation. The heart is of normal size. The aorta is of normal caliber. No pleural or pericardial effusion. A tiny nonobstructing right renal calculus is noted. DICOM format imaged data is available to non-affiliated external healthcare facilities or entities on a secure, media free, reciprocally searchable basis with patient authorization  for 12 months following the date of the study.[employer] medical dictation software was used for portions of this report.  Efforts have been made to edit the dictations, but occasionally words are mis-transcribed.    Critical Care time: 54 minutes    Knox Saliva, MD     08/07/2017  00:10 AM

## 2017-08-07 NOTE — Progress Notes (Signed)
Problem: Falls - Risk of  Goal: *Absence of Falls  Document Schmid Fall Risk and appropriate interventions in the flowsheet.  Outcome: Progressing Towards Goal  Fall Risk Interventions:  Mobility Interventions: Assess mobility with egress test, Bed/chair exit alarm, Patient to call before getting OOB         Medication Interventions: Assess postural VS orthostatic hypotension, Bed/chair exit alarm, Patient to call before getting OOB, Teach patient to arise slowly    Elimination Interventions: Bed/chair exit alarm, Call light in reach, Patient to call for help with toileting needs

## 2017-08-07 NOTE — ED Notes (Signed)
Assumed care of pt from previous nurse. Pt resting in bed. Pt moved to room 33 and placed in hospital bed. Pt is placed for stepdown admission, no bed currently available upstairs, pt is aware of current bed status.

## 2017-08-07 NOTE — ED Notes (Signed)
Doxy hung. bolus hung WO as ordered.    VS updated.    Pt given crackers and ice chips.     Resting in bed.     Sinus Tach per CM rate of 104. 100 on 2LNC.

## 2017-08-07 NOTE — ED Notes (Signed)
Pt on call bell and states she smells smoke. Advised pt of TrueD unit cleaning another room.

## 2017-08-07 NOTE — Progress Notes (Signed)
[facility] Dosing [facility]: Vancomycin    Consult for Vancomycin Dosing by Pharmacy by Dr. Langston MaskerMorris  Consult provided for this 67 y.o. year old female , for indication of r/o sepsis.  Day of Therapy :new start    Ht Readings from Last 1 Encounters:   08/06/17 172.7 cm (68")        Wt Readings from Last 1 Encounters:   08/06/17 93 kg (205 lb)        Previous Regimen    Last Level    Other Current Antibiotics Zosyn/doxycycline   Significant Cultures    Serum Creatinine Lab Results   Component Value Date/Time    Creatinine 1.8 (H) 08/06/2017 08:50 PM      Creatinine Clearance Estimated Creatinine Clearance: 37.1 mL/min (A) (based on SCr of 1.8 mg/dL (H)).   BUN Lab Results   Component Value Date/Time    BUN 31 (H) 08/06/2017 08:50 PM      WBC Lab Results   Component Value Date/Time    WBC 19.6 (H) 08/06/2017 08:50 PM      H/H Lab Results   Component Value Date/Time    HGB 12.4 (L) 08/06/2017 08:50 PM      Platelets Lab Results   Component Value Date/Time    PLATELET 371 08/06/2017 08:50 PM      Temp 97.9 ??F (36.6 ??C)     Start Vancomycin therapy, with loading dose of 2000 mg IV x 1.    Further orders to follow.    Pharmacy to follow daily and will make changes to dose and/or frequency based on clinical status.    Pharmacist Sandie AnoKimberly Sherin, PHARMD

## 2017-08-07 NOTE — ED Notes (Signed)
Dr Langston MaskerMorris aware of lactic acid 4.4.    Orders placed.

## 2017-08-07 NOTE — ED Notes (Signed)
Lab resulted lactic acid of 4.4    Dr Langston MaskerMorris paged.

## 2017-08-07 NOTE — ED Notes (Signed)
Report given to stephanie, RN

## 2017-08-07 NOTE — ED Notes (Signed)
Pt requesting additional breathing treatment, Resp aware.    0315 labs drawn and sent upstairs.    Call light within reach.

## 2017-08-08 LAB — GLUCOSE, POC
Glucose (POC): 201 mg/dL — ABNORMAL HIGH (ref 65–105)
Glucose (POC): 187 mg/dL — ABNORMAL HIGH (ref 65–105)
Glucose (POC): 275 mg/dL — ABNORMAL HIGH (ref 65–105)
Glucose (POC): 195 mg/dL — ABNORMAL HIGH (ref 65–105)

## 2017-08-08 LAB — CBC W/O DIFF
HCT: 33.6 % — ABNORMAL LOW (ref 37.0–50.0)
HGB: 10.8 gm/dl — ABNORMAL LOW (ref 13.0–17.2)
MCH: 28.6 pg (ref 25.4–34.6)
MCHC: 32.1 gm/dl (ref 30.0–36.0)
MCV: 88.9 fL (ref 80.0–98.0)
MPV: 11.7 fL — ABNORMAL HIGH (ref 6.0–10.0)
PLATELET: 190 10*3/uL (ref 140–450)
RBC: 3.78 M/uL (ref 3.60–5.20)
RDW-SD: 42.5 (ref 36.4–46.3)
WBC: 11.4 10*3/uL — ABNORMAL HIGH (ref 4.0–11.0)

## 2017-08-08 LAB — METABOLIC PANEL, BASIC
Anion gap: 10 mmol/L (ref 5–15)
BUN: 12 mg/dl (ref 7–25)
CO2: 23 mEq/L (ref 21–32)
Calcium: 8.5 mg/dl (ref 8.5–10.1)
Chloride: 107 mEq/L (ref 98–107)
Creatinine: 1.2 mg/dl (ref 0.6–1.3)
GFR est AA: 58
GFR est non-AA: 48
Glucose: 353 mg/dl — ABNORMAL HIGH (ref 74–106)
Potassium: 4.5 mEq/L (ref 3.5–5.1)
Sodium: 140 mEq/L (ref 136–145)

## 2017-08-08 LAB — LEGIONELLA PNEUMOPHILA AG, URINE: Legionella Ag, urine: NEGATIVE

## 2017-08-08 LAB — S.PNEUMO AG, UR/CSF: Strep pneumo Ag, urine: NEGATIVE

## 2017-08-08 LAB — LACTIC ACID: Lactic Acid: 3.5 mmol/L — ABNORMAL HIGH (ref 0.4–2.0)

## 2017-08-08 MED ORDER — TRAMADOL 50 MG TAB
50 mg | Freq: Four times a day (QID) | ORAL | Status: DC | PRN
Start: 2017-08-08 — End: 2017-08-09
  Administered 2017-08-08 – 2017-08-09 (×2): via ORAL

## 2017-08-08 MED ORDER — ZOLPIDEM 5 MG TAB
5 mg | Freq: Every evening | ORAL | Status: DC | PRN
Start: 2017-08-08 — End: 2017-08-09
  Administered 2017-08-08 – 2017-08-09 (×2): via ORAL

## 2017-08-08 MED ORDER — PREDNISONE 20 MG TAB
20 mg | Freq: Every day | ORAL | Status: DC
Start: 2017-08-08 — End: 2017-08-09
  Administered 2017-08-09: 14:00:00 via ORAL

## 2017-08-08 MED FILL — MUCINEX 600 MG TABLET, EXTENDED RELEASE: 600 mg | ORAL | Qty: 1

## 2017-08-08 MED FILL — DOXY-100 100 MG INTRAVENOUS SOLUTION: 100 mg | INTRAVENOUS | Qty: 100

## 2017-08-08 MED FILL — ALBUTEROL SULFATE 0.083 % (0.83 MG/ML) SOLN FOR INHALATION: 2.5 mg /3 mL (0.083 %) | RESPIRATORY_TRACT | Qty: 1

## 2017-08-08 MED FILL — QUETIAPINE SR 50 MG 24 HR TAB: 50 mg | ORAL | Qty: 3

## 2017-08-08 MED FILL — ROSUVASTATIN 10 MG TAB: 10 mg | ORAL | Qty: 1

## 2017-08-08 MED FILL — ZOLPIDEM 5 MG TAB: 5 mg | ORAL | Qty: 1

## 2017-08-08 MED FILL — PANTOPRAZOLE 40 MG IV SOLR: 40 mg | INTRAVENOUS | Qty: 40

## 2017-08-08 MED FILL — ACETAMINOPHEN 325 MG TABLET: 325 mg | ORAL | Qty: 2

## 2017-08-08 MED FILL — BENZONATATE 100 MG CAP: 100 mg | ORAL | Qty: 1

## 2017-08-08 MED FILL — SOLU-MEDROL (PF) 40 MG/ML SOLUTION FOR INJECTION: 40 mg/mL | INTRAMUSCULAR | Qty: 1

## 2017-08-08 MED FILL — ENOXAPARIN 40 MG/0.4 ML SUB-Q SYRINGE: 40 mg/0.4 mL | SUBCUTANEOUS | Qty: 0.4

## 2017-08-08 MED FILL — TRAMADOL 50 MG TAB: 50 mg | ORAL | Qty: 1

## 2017-08-08 MED FILL — AMLODIPINE 5 MG TAB: 5 mg | ORAL | Qty: 2

## 2017-08-08 MED FILL — MONTELUKAST 10 MG TAB: 10 mg | ORAL | Qty: 1

## 2017-08-08 MED FILL — SPIRONOLACTONE 25 MG TAB: 25 mg | ORAL | Qty: 4

## 2017-08-08 NOTE — Other (Signed)
Bedside shift change report given to Sherian MaroonAbbie, Charity fundraiserN (Cabin crewoncoming nurse) by Angela Nevinion, RN (offgoing nurse). Report included the following information SBAR, Kardex, and Cardiac Rhythm (Sinus Tach).

## 2017-08-08 NOTE — Progress Notes (Signed)
INTERNAL MEDICINE PROGRESS NOTE    Date of note:      August 08, 2017    Patient:               [name] [name], 67 y.o., female  Admit Date:        08/06/2017  Length of Stay:  1 day(s)    Overnight Events:      Reviewed.    Subjective:     Patient seen and examined at bedside.     Patient states she feels much better today and was inquiring about discharge.  She still reports cough and exertional dyspnea.  She does state that it significantly improved however.  She denies nausea, vomiting, or abdominal pain.    Assessment:      1. Acute hypoxic/hypercapnic respiratory failure secondary to COPD exacerbation.  Improved  2. COPD exacerbation with bullous emphysema  3. Lactic acidosis secondary to multiple breathing treatments  4. Acute renal failure, improving  5. Hyperkalemia secondary to renal failure.  Improved  6. SIRS.  No evidence of pneumonia on CT chest  7. Non-insulin-dependent type 2 diabetes, uncontrolled, with hyperglycemia.  Hemoglobin A1c 9.5  8. Chronic normocytic anemia with baseline hemoglobin between 12-13    Plan:   ?? Continue nebulizer and inhaler treatments.  Switched Solu-Medrol to prednisone.  Continue doxycycline  ?? Wean SaO2 above 88%  ?? Lactic acidosis related to breathing treatments.  Does not appear infected.  No evidence of ischemia.  She did receive fluid boluses yesterday.  Recheck lactic acid level  ?? Stop IV fluids and encourage oral intake  ?? Patient will likely need walking pulse oximetry prior to discharge     Disposition:     Likely discharge tomorrow pending walking pulse ox    Reviewed: Vital signs/Labs/Imaging/Micro/Available consultation and progress notes  Discussed Plan with: Patient, nurse     Hospital Course:     Patient is a 67 year old female with known emphysema who comes into the hospital with acute shortness of breath, cough, congestion.  She was found to have COPD exacerbation and started on IV steroids, breathing  treatments, and doxycycline.  She also had acute renal failure likely secondary to dehydration and was given IV fluids.    Objective:     Visit Vitals  BP 130/85 (BP 1 Location: Right arm, BP Patient Position: Supine)   Pulse (!) 115   Temp 97.4 ??F (36.3 ??C)   Resp 19   Ht 5\' 8"  (1.727 m)   Wt 92.2 kg (203 lb 4.2 oz)   SpO2 98%   Breastfeeding? No   BMI 30.91 kg/m??       Intake/Output Summary (Last 24 hours) at 08/08/2017 1358  Last data filed at 08/08/2017 1257  Gross per 24 hour   Intake 2330 ml   Output 1200 ml   Net 1130 ml     General: NAD, alert and oriented x 3  Head/Face: Normocephalic, atraumatic  Neck: Supple, no JVD  Eyes: no scleral icterus, no conjunctival pallor   Cardiac: Regular rate and rhythm. S1, S2 heard. No murmurs or rubs noted  Chest: Symmetrical chest rise  Respiratory: Poor inspiratory/expiratory volumes with prolonged expiration. Rhonchi heard in bilateral lower lung fields   Abdomen: soft, non-tender, non-distended. Normal bowel sounds  Extremities: No edema or erythema noted   Skin: No rashes or lesions noted  Back: No focal back tenderness on palpation. No gross bony deformities noted  Neuro: No focal neurological deficits noted; cranial  nerves grossly intact       Current Medications:     Current Facility-Administered Medications:   ???  traMADol (ULTRAM) tablet 50 mg, 50 mg, Oral, Q6H PRN, Linward Natal, MD, 50 mg at 08/08/17 1317  ???  [START ON 08/09/2017] predniSONE (DELTASONE) tablet 40 mg, 40 mg, Oral, DAILY WITH BREAKFAST, Linward Natal, MD  ???  doxycycline (VIBRAMYCIN) 100 mg in 0.9% sodium chloride 250 mL IVPB, 100 mg, IntraVENous, Q12H, Digeronimo, Kate E, PA-C, 100 mg at 08/08/17 1317  ???  naloxone (NARCAN) injection 0.1 mg, 0.1 mg, IntraVENous, PRN, [name], Sharren Bridge, MD  ???  ELECTROLYTE REPLACEMENT PROTOCOL, 1 Each, Other, PRN, [name], Sharren Bridge, MD  ???  ELECTROLYTE REPLACEMENT PROTOCOL, 1 Each, Other, PRN, [name], Sharren Bridge, MD   ???  ELECTROLYTE REPLACEMENT PROTOCOL, 1 Each, Other, PRN, [name], Sharren Bridge, MD  ???  enoxaparin (LOVENOX) injection 40 mg, 40 mg, SubCUTAneous, Q24H, [name], Sharren Bridge, MD, 40 mg at 08/08/17 0510  ???  albuterol (PROVENTIL VENTOLIN) nebulizer solution 1.25 mg, 1.25 mg, Nebulization, Q2H PRN, Otilio Carpen, MD  ???  pantoprazole (PROTONIX) 40 mg in sodium chloride 0.9% 10 mL injection, 40 mg, IntraVENous, DAILY, [name], Sharren Bridge, MD, 40 mg at 08/08/17 1610  ???  dextrose (D50) infusion 5-25 g, 10-50 mL, IntraVENous, PRN, Linward Natal, MD  ???  glucagon (GLUCAGEN) injection 1 mg, 1 mg, IntraMUSCular, PRN, Linward Natal, MD  ???  insulin glargine (LANTUS) injection 1-100 Units, 1-100 Units, SubCUTAneous, QHS, Linward Natal, MD, 23 Units at 08/07/17 2101  ???  insulin lispro (HUMALOG) injection 1-100 Units, 1-100 Units, SubCUTAneous, AC&HS, Linward Natal, MD, 17 Units at 08/08/17 1232  ???  insulin lispro (HUMALOG) injection 1-100 Units, 1-100 Units, SubCUTAneous, PRN, Linward Natal, MD, 4 Units at 08/08/17 1232  ???  guaiFENesin ER (MUCINEX) tablet 600 mg, 600 mg, Oral, BID, Linward Natal, MD, 600 mg at 08/08/17 9604  ???  albuterol (PROVENTIL VENTOLIN) nebulizer solution 2.5 mg, 2.5 mg, Nebulization, Q6H RT, Linward Natal, MD, 2.5 mg at 08/08/17 0948  ???  tiotropium bromide (SPIRIVA RESPIMAT) 2.5 mcg Salvadore Farber, 1 [place], Inhalation, DAILY, Linward Natal, MD, 1 Puff at 08/08/17 0848  ???  amLODIPine (NORVASC) tablet 10 mg, 10 mg, Oral, DAILY, Linward Natal, MD, 10 mg at 08/08/17 1027  ???  nabumetone (RELAFEN) tablet 750 mg, 750 mg, Oral, BID PRN, Linward Natal, MD  ???  QUEtiapine SR (SEROquel XR) tablet 150 mg, 150 mg, Oral, QHS, Linward Natal, MD, 150 mg at 08/07/17 2102  ???  traZODone (DESYREL) tablet 50 mg, 50 mg, Oral, QHS PRN, Linward Natal, MD  ???  fluticasone-vilanterol (BREO ELLIPTA) 253mcg-25mcg/puff, 1 Puff, Inhalation, DAILY, Linward Natal, MD, 1 Puff at 08/08/17 0848  ???  acetaminophen (TYLENOL) tablet 650 mg, 650 mg, Oral, Q4H PRN, 650 mg at  08/08/17 1115 **OR** acetaminophen (TYLENOL) solution 650 mg, 650 mg, Oral, Q4H PRN **OR** acetaminophen (TYLENOL) suppository 650 mg, 650 mg, Rectal, Q4H PRN, Linward Natal, MD  ???  ondansetron (ZOFRAN ODT) tablet 4 mg, 4 mg, Oral, Q8H PRN **OR** ondansetron (ZOFRAN) injection 4 mg, 4 mg, IntraVENous, Q8H PRN, Linward Natal, MD  ???  benzonatate (TESSALON) capsule 100 mg, 100 mg, Oral, TID PRN, Linward Natal, MD, 100 mg at 08/07/17 2047  ???  montelukast (SINGULAIR) tablet 10 mg, 10 mg, Oral, QHS, Linward Natal, MD, 10 mg at 08/07/17 2103  ???  spironolactone (ALDACTONE) tablet 100 mg, 100 mg, Oral, DAILY, Linward Natal, MD, 100 mg at 08/08/17 1027  ???  rosuvastatin (CRESTOR) tablet 5 mg, 5 mg, Oral, QHS, Linward Natal, MD, 5 mg at 08/07/17 2102  ???  zolpidem (AMBIEN) tablet 5 mg, 5 mg, Oral, QHS PRN, Matriano, Thamas Jaegers, MD, 5 mg at 08/07/17 2047  Labs:     Recent Results (from the past 24 hour(s))   GLUCOSE, POC    Collection Time: 08/07/17  3:04 PM   Result Value Ref Range    Glucose (POC) 190 (H) 65 - 105 mg/dL   LACTIC ACID    Collection Time: 08/07/17  3:05 PM   Result Value Ref Range    Lactic Acid 7.0 (HH) 0.4 - 2.0 mmol/L   METABOLIC PANEL, BASIC    Collection Time: 08/07/17  3:05 PM   Result Value Ref Range    Sodium 139 136 - 145 mEq/L    Potassium 5.2 (H) 3.5 - 5.1 mEq/L    Chloride 108 (H) 98 - 107 mEq/L    CO2 19 (L) 21 - 32 mEq/L    Glucose 195 (H) 74 - 106 mg/dl    BUN 20 7 - 25 mg/dl    Creatinine 1.4 (H) 0.6 - 1.3 mg/dl    GFR est AA 16.1      GFR est non-AA 40      Calcium 8.8 8.5 - 10.1 mg/dl    Anion gap 12 5 - 15 mmol/L   GLUCOSE, POC    Collection Time: 08/07/17  4:30 PM   Result Value Ref Range    Glucose (POC) 205 (H) 65 - 105 mg/dL   GLUCOSE, POC    Collection Time: 08/07/17  8:51 PM   Result Value Ref Range    Glucose (POC) 201 (H) 65 - 105 mg/dL   LEGIONELLA PNEUMOPHILA AG, URINE    Collection Time: 08/07/17  9:00 PM   Result Value Ref Range    Legionella Ag, urine NEGATIVE NEGATIVE      S.PNEUMO AG, UR/CSF    Collection Time: 08/07/17  9:00 PM   Result Value Ref Range    Strep pneumo Ag, urine NEGATIVE NEGATIVE     GLUCOSE, POC    Collection Time: 08/08/17  7:36 AM   Result Value Ref Range    Glucose (POC) 187 (H) 65 - 105 mg/dL   CBC W/O DIFF    Collection Time: 08/08/17  9:05 AM   Result Value Ref Range    WBC 11.4 (H) 4.0 - 11.0 1000/mm3    RBC 3.78 3.60 - 5.20 M/uL    HGB 10.8 (L) 13.0 - 17.2 gm/dl    HCT 09.6 (L) 04.5 - 50.0 %    MCV 88.9 80.0 - 98.0 fL    MCH 28.6 25.4 - 34.6 pg    MCHC 32.1 30.0 - 36.0 gm/dl    PLATELET 409 811 - 914 1000/mm3    MPV 11.7 (H) 6.0 - 10.0 fL    RDW-SD 42.5 36.4 - 46.3     METABOLIC PANEL, BASIC    Collection Time: 08/08/17  9:05 AM   Result Value Ref Range    Sodium 140 136 - 145 mEq/L    Potassium 4.5 3.5 - 5.1 mEq/L    Chloride 107 98 - 107 mEq/L    CO2 23 21 - 32 mEq/L    Glucose 353 (H) 74 - 106 mg/dl    BUN 12 7 - 25 mg/dl    Creatinine 1.2 0.6 - 1.3 mg/dl    GFR est AA 58.0  GFR est non-AA 48      Calcium 8.5 8.5 - 10.1 mg/dl    Anion gap 10 5 - 15 mmol/L   GLUCOSE, POC    Collection Time: 08/08/17 11:40 AM   Result Value Ref Range    Glucose (POC) 275 (H) 65 - 105 mg/dL     XR Results:  Results from Hospital Encounter encounter on 08/06/17   XR CHEST PA LAT    Narrative Indication: Respiratory distress.      Impression IMPRESSION: Chronic obstructive lung disease. Left pulmonary nodules.    Comment: AP and lateral views chest reveal the lungs are hyperaerated from  chronic obstructive disease. Bullous disease is present bilaterally. Left  pulmonary nodules are again observed unchanged from June 15, 2017. The heart  is of normal size.       CT Results:  Results from Hospital Encounter encounter on 08/06/17   CT CHEST WO CONT    Narrative Indication: Respiratory distress. Hypoxia. Elevated white blood count. Diabetes.  Lung nodules.      Impression IMPRESSION: Bullous emphysema. Stable left lower lobe 18 mm nodule. Stable tiny   peripheral left upper lobe nodules. 2 cm right thyroid nodule. Tiny  nonobstructing right renal calculus.    Comment: CT images of the chest were obtained without intravenous contrast. This  was compared with Nov 03, 2016 and July 23, 2016.    A 2 cm right thyroid nodule is present.    There is diffuse bullous emphysema with large bullae bilaterally.    An 18 x 16 mm left lower lobe nodule is again seen, unchanged from prior study.  In addition there are tiny peripheral left upper lobe nodules also unchanged.    No pulmonary consolidation.    The heart is of normal size. The aorta is of normal caliber.    No pleural or pericardial effusion.    A tiny nonobstructing right renal calculus is noted.    DICOM format imaged data is available to non-affiliated external healthcare  facilities or entities on a secure, media free, reciprocally searchable basis  with patient authorization  for 12 months following the date of the study.Marland Kitchen       MRI Results:  Results from Hospital Encounter encounter on 04/07/15   MRI BRAIN W WO CONT    Narrative CLINICAL HISTORY:  Abnormal CT, meningioma    PROCEDURE: MRI brain without and with contrast    COMPARISON: Head CT dated 04/08/2015    FINDINGS:    There is age-related cerebral atrophy. There is extensive chronic small vessel  ischemic change, advanced for patient age. As noted on CT, there is a enhancing  extra-axial mass in the left CP angle along the petrous temporal bone measuring  10 mm. This is observed on image 9 series 12, image 6 series 11 and image 21  series 13. There are dural tails extending to the tentorium cerebelli. Imaging  findings would favor meningioma. No other areas of abnormal brain parenchymal  enhancement. The sella, pineal region, craniocervical junction are unremarkable.  Skull base flow voids are patent.      Impression IMPRESSION:  1. Enhancing mass in the left posterior fossa adjacent to the petrous temporal   bone as described above. Main consideration would include meningioma. Other less  likely differential considerations would include dural metastasis and lymphoma.  Consider surveillance or further evaluation.    2.  Chronic small vessel ischemic changes, advanced for patient age.  Nuclear Medicine Results:  No results found for this or any previous visit.  US Results:  Results from Hospital Encounter encounter on 12/22/15   US GUIDE FINE NDL ASP W IMAGE    Narrative Clinical History: Right thyroid nodule    Procedure: Ultrasound guided fine needle biopsy of  right thyroid gland nodule    Radiologist: Allena KatzPatel    Anesthesia: Local    Procedure:  Benefits, risks and possible complications were discussed in detail with the  patient and informed consent obtained. Patient's neck was prepped and draped in  sterile manner. Lidocaine was administered to obtain local anesthesia.    Under ultrasound guidance 18, 20 and 25 gauge hypodermic needle was advanced  into the thyroid nodule and fine needle aspirates obtained. 4 fine needle  aspirates were obtained and submitted for histopathologic evaluation.    Hemostasis was achieved with manual compression. Patient tolerated procedure  well. There were no immediate postprocedural complications.      Impression Impression:   Successful 4 fine needle aspirates of right thyroid cyst performed under  ultrasound guidance. Lesion is cystic, then may be absence of cellularity and  results nondiagnostic, 4 mm of amber fluid aspirated. 2 attempts were made to  obtain cells from wall of the cyst.       IR Results:  No results found for this or any previous visit.  VAS/US Results:  No results found for this or any previous visit.    Total clinical care time is 25 minutes, including review of chart including all labs, radiology, past medical history, and discussion with patient and family. Greater than 50% of my time was spent in [place] care and counseling     Portions of this electronic record were dictated using Conservation officer, historic buildingsDragon voice recognition software. Unintended errors in translation may occur.        Linward Natalevin Jourdon Zimmerle, MD  [facility]  [facility]  August 08, 2017  1:04 PM

## 2017-08-08 NOTE — Progress Notes (Signed)
Problem: Falls - Risk of  Goal: *Absence of Falls  Document Schmid Fall Risk and appropriate interventions in the flowsheet.  Outcome: Progressing Towards Goal  Fall Risk Interventions:  Mobility Interventions: Assess mobility with egress test, Bed/chair exit alarm, Patient to call before getting OOB, PT Consult for mobility concerns, Utilize walker, cane, or other assistive device         Medication Interventions: Assess postural VS orthostatic hypotension, Bed/chair exit alarm, Patient to call before getting OOB, Teach patient to arise slowly    Elimination Interventions: Bed/chair exit alarm, Call light in reach, Patient to call for help with toileting needs

## 2017-08-08 NOTE — Progress Notes (Signed)
Patient admitted on 08/06/2017 from home with   Chief Complaint   Patient presents with   ??? Respiratory Distress        The patient has been admitted to the hospital 2 times in the past 12 months.    Previous 4 Admission Dates Admission and Discharge Diagnosis Interventions Barriers Disposition                                   Tentative dc plan:    Home with PCP follow up and family assistance, patient states [facility] and [facility] if dc'd with [facility].    Facility if plan    Anticipated Discharge Date:   2-3 days pending progress    PCP: Littie Deedsharakan, Jolson K, MD     Specialists:         Face sheet information, address, contact info and insurance verified yes    Dialysis Unit/ chair time / access:    n/a    Pharmacy:     [facility] [address]  Any issues getting medications no    DME at home and provider:    Has a walker, BSC, and a lift    O2 at home no    Home Environment:    [place]ives at [address]  [place] [id number]    @[email or web address] @.     Prior to admission open services:     n/a    Home Health Agency-     n/a    Personal Care Agency-    n/a    Extended Emergency Contact Information  Primary Emergency Contact: Goodwin,Bobby  Address: [address]           [place], [place] [id number] [place]  Home Phone: [phone]  Mobile Phone: [phone]  Relation: Spouse      Transportation:     family will transport home    Therapy Recommendations:  OT :y/n     n  PT :y/n     n  SLP :y/n    n    RT Home O2 Evaluation :y/n     n    Wound Care: y/n     n    Change consult (formerly [facility]) :    n    Case Management Assessment    ABUSE/NEGLECT SCREENING   Physical Abuse/Neglect: Denies   Sexual Abuse: Denies   Sexual Abuse: Denies   Other Abuse/Issues: Denies          PRIMARY DECISION MAKER                                   CARE MANAGEMENT INTERVENTIONS   Readmission Interview Completed: Not Applicable   PCP Verified by CM: Yes            Mode of Transport at Discharge: Self       Transition of Care Consult (CM Consult): Discharge Planning                               Current Support Network: Lives with Spouse   Reason for Referral: DCP Rounds   History Provided By: Patient   Patient Orientation: Alert and Oriented, Person, Place, Situation, Self   Cognition: Alert   Support  System Response: Unavailable   Previous Living Arrangement: Lives with Family Dependent       Prior Functional Level: Assistance with the following:, Shopping, Cooking, Housework   Current Functional Level: Assistance with the following:, Shopping, Cooking, Housework   Primary Language: English   Can patient return to prior living arrangement: Yes   Ability to make needs known:: Good   Family able to assist with home care needs:: Yes                       Confirm Follow Up Transport: Family       Plan discussed with Pt/Family/Caregiver: Yes          DISCHARGE LOCATION   Discharge Placement: Home with home health

## 2017-08-09 ENCOUNTER — Encounter

## 2017-08-09 LAB — METABOLIC PANEL, BASIC
Anion gap: 8 mmol/L (ref 5–15)
BUN: 16 mg/dl (ref 7–25)
CO2: 27 mEq/L (ref 21–32)
Calcium: 8.8 mg/dl (ref 8.5–10.1)
Chloride: 106 mEq/L (ref 98–107)
Creatinine: 0.9 mg/dl (ref 0.6–1.3)
GFR est AA: >60
GFR est non-AA: >60
Glucose: 168 mg/dl — ABNORMAL HIGH (ref 74–106)
Potassium: 3.5 mEq/L (ref 3.5–5.1)
Sodium: 140 mEq/L (ref 136–145)

## 2017-08-09 LAB — CBC W/O DIFF
HCT: 36.7 % — ABNORMAL LOW (ref 37.0–50.0)
HGB: 11.9 gm/dl — ABNORMAL LOW (ref 13.0–17.2)
MCH: 28.7 pg (ref 25.4–34.6)
MCHC: 32.4 gm/dl (ref 30.0–36.0)
MCV: 88.6 fL (ref 80.0–98.0)
MPV: 10.7 fL — ABNORMAL HIGH (ref 6.0–10.0)
PLATELET: 217 10*3/uL (ref 140–450)
RBC: 4.14 M/uL (ref 3.60–5.20)
RDW-SD: 41.5 (ref 36.4–46.3)
WBC: 13.6 10*3/uL — ABNORMAL HIGH (ref 4.0–11.0)

## 2017-08-09 LAB — LACTIC ACID: Lactic Acid: 0.9 mmol/L (ref 0.4–2.0)

## 2017-08-09 LAB — GLUCOSE, POC
Glucose (POC): 159 mg/dL — ABNORMAL HIGH (ref 65–105)
Glucose (POC): 155 mg/dL — ABNORMAL HIGH (ref 65–105)
Glucose (POC): 143 mg/dL — ABNORMAL HIGH (ref 65–105)

## 2017-08-09 MED ORDER — TIOTROPIUM BROMIDE 2.5 MCG/ACTUATION MIST FOR INHALATION
2.5 mcg/actuation | Freq: Every day | RESPIRATORY_TRACT | 0 refills | Status: DC
Start: 2017-08-09 — End: 2017-09-20

## 2017-08-09 MED ORDER — GUAIFENESIN 600 MG TABLET,EXTENDED RELEASE BIPHASIC 12 HR
600 mg | ORAL_TABLET | Freq: Two times a day (BID) | ORAL | 0 refills | Status: AC
Start: 2017-08-09 — End: 2017-08-14

## 2017-08-09 MED ORDER — ALBUTEROL SULFATE 0.083 % (0.83 MG/ML) SOLN FOR INHALATION
2.5 mg /3 mL (0.083 %) | Freq: Four times a day (QID) | RESPIRATORY_TRACT | Status: DC
Start: 2017-08-09 — End: 2017-08-09
  Administered 2017-08-09: 18:00:00 via RESPIRATORY_TRACT

## 2017-08-09 MED ORDER — TRAZODONE 50 MG TAB
50 mg | ORAL_TABLET | Freq: Every evening | ORAL | 0 refills | Status: DC | PRN
Start: 2017-08-09 — End: 2019-07-05

## 2017-08-09 MED ORDER — PREDNISONE 10 MG TAB
10 mg | ORAL_TABLET | ORAL | 0 refills | Status: AC
Start: 2017-08-09 — End: 2017-08-22

## 2017-08-09 MED ORDER — DOXYCYCLINE 100 MG CAP
100 mg | ORAL_CAPSULE | Freq: Two times a day (BID) | ORAL | 0 refills | Status: AC
Start: 2017-08-09 — End: 2017-08-11

## 2017-08-09 MED ORDER — ALBUTEROL SULFATE HFA 90 MCG/ACTUATION AEROSOL INHALER
90 mcg/actuation | RESPIRATORY_TRACT | 0 refills | Status: DC
Start: 2017-08-09 — End: 2017-09-14

## 2017-08-09 MED ORDER — FLUTICASONE-SALMETEROL 100 MCG-50 MCG/DOSE DISK DEVICE FOR INHALATION
100-50 mcg/dose | Freq: Two times a day (BID) | RESPIRATORY_TRACT | 0 refills | Status: DC
Start: 2017-08-09 — End: 2017-10-11

## 2017-08-09 MED FILL — MONTELUKAST 10 MG TAB: 10 mg | ORAL | Qty: 1

## 2017-08-09 MED FILL — AMLODIPINE 5 MG TAB: 5 mg | ORAL | Qty: 2

## 2017-08-09 MED FILL — ZOLPIDEM 5 MG TAB: 5 mg | ORAL | Qty: 1

## 2017-08-09 MED FILL — ALBUTEROL SULFATE 0.083 % (0.83 MG/ML) SOLN FOR INHALATION: 2.5 mg /3 mL (0.083 %) | RESPIRATORY_TRACT | Qty: 1

## 2017-08-09 MED FILL — TRAMADOL 50 MG TAB: 50 mg | ORAL | Qty: 1

## 2017-08-09 MED FILL — DOXY-100 100 MG INTRAVENOUS SOLUTION: 100 mg | INTRAVENOUS | Qty: 100

## 2017-08-09 MED FILL — QUETIAPINE SR 50 MG 24 HR TAB: 50 mg | ORAL | Qty: 3

## 2017-08-09 MED FILL — ROSUVASTATIN 10 MG TAB: 10 mg | ORAL | Qty: 1

## 2017-08-09 MED FILL — PANTOPRAZOLE 40 MG IV SOLR: 40 mg | INTRAVENOUS | Qty: 40

## 2017-08-09 MED FILL — ENOXAPARIN 40 MG/0.4 ML SUB-Q SYRINGE: 40 mg/0.4 mL | SUBCUTANEOUS | Qty: 0.4

## 2017-08-09 MED FILL — SPIRONOLACTONE 25 MG TAB: 25 mg | ORAL | Qty: 4

## 2017-08-09 MED FILL — MUCINEX 600 MG TABLET, EXTENDED RELEASE: 600 mg | ORAL | Qty: 1

## 2017-08-09 MED FILL — PREDNISONE 20 MG TAB: 20 mg | ORAL | Qty: 2

## 2017-08-09 NOTE — Progress Notes (Signed)
Discharge Plan:   Home with home health, and PCP follow up     Discharge Date:     08/09/2017     [facility]       Home Health Needed:     PT/SN    Home Health Agency:    [facility]      Confirmed start of care with the home health agency and spoke with: email sent    DME needed and ordered for Discharge:    n/a bipap/cpap     CM confirmed delivery of DME: n/a    TCC Referral:     n/a    Medication Assistance given     N       meds given (please list)     n/a    Change(MDC/SSDI) Referral/outcome    n/a     Transportation: Family will transport home

## 2017-08-09 NOTE — Other (Addendum)
----------  DocumentID: [id number]------------------------------------------------              [facility]                       Patient Education Report         Name: [name] [name], Addy                  Date: 08/07/2017    MRN: [id number]                    Time: 3:07:18 PM         Patient ordered video: 'Patient Safety: Stay Safe While you are in the Hospital'    from [id number] via phone number: [phone] at 3:07:18 PM    Description: This program outlines some of the precautions patients can take to ensure a speedy recovery without extra complications. The video emphasizes the importance of communicating with the healthcare team.    ----------DocumentID: [id number]------------------------------------------------                       [facility]          Patient Education Report - Discharge Summary        Date: 08/09/2017   Time: 2:14:57 PM   Name: [name] [name], Kaly   MRN: [id number]      Account Number: [id number]      Education History:        Patient ordered video: 'Patient Safety: Stay Safe While you are in the Hospital' from [id number] on 08/07/2017 03:07:18 PM

## 2017-08-09 NOTE — Other (Signed)
Home Care Face to Face Encounter  Patient???s Name: [name] [name]    Date of Birth: 05/17/1951    Primary Diagnosis: COPD with acute exacerbation    Date of Face to Face:   08/09/17                                  Face to Face Encounter findings are related to primary reason for home care:   yes.     1. I certify that the patient needs intermittent care as follows: skilled nursing care:  skilled observation/assessment, patient education  physical therapy: strengthening, stretching/ROM, transfer training, gait/stair training, balance training and pt/caregiver education    2. I certify that this patient is homebound, that is: 1) patient requires the use of a  device, special transportation, or assistance of another to leave the home; or 2) patient's condition makes leaving the home medically contraindicated; and 3) patient has a normal inability to leave the home and leaving the home requires considerable and taxing effort.  Patient may leave the home for infrequent and short duration for medical reasons, and occasional absences for non-medical reasons. Homebound status is due to the following functional limitations: Patient with strength deficits limiting the performance of all ADL's without caregiver assistance or the use of an assistive device.  Patient with poor safety awareness and is at risk for falls without assistance of another person and the use of an assistive device.  Patient with poor ambulation endurance limiting their safe ability to ascend/descend the required number of steps to leave the home.    3. I certify that this patient is under my care and that I, or a nurse practitioner or physician???s assistant, or clinical nurse specialist, or certified nurse midwife, working with me, had a Face-to-Face Encounter that meets the physician Face-to-Face Encounter requirements.  The following are the clinical findings from the Face-to-Face encounter that  support the need for skilled services and is a summary of the encounter: 08/09/2017    See discharge summary      Paschal DoppClara C Sirma, RN  08/09/2017

## 2017-08-09 NOTE — Discharge Summary (Signed)
Discharge Summary         Patient ID:  [name] [name], 67 y.o., female  DOB: [birth year]    PCP:  Lake Bells, MD    Admit Date: 08/06/2017  8:10 PM  Discharge Date:  No discharge date for patient encounter.  Length of stay: 2 day(s)  Discharge Disposition: Home or Self Care     Chief Complaint   Patient presents with   ??? Respiratory Distress       Hospital Course:      Patient is a 67 year old female with known emphysema who comes into the hospital with acute shortness of breath, cough, congestion.  She was found to have COPD exacerbation and started on IV steroids, breathing treatments, and doxycycline.  She also had acute renal failure likely secondary to dehydration and was given IV fluids. She had improvement in her dyspnea and renal failure. She will be discharged with taper of prednisone.     Discharge Diagnoses:  1. Acute hypoxic/hypercapnic respiratory failure secondary to COPD exacerbation.  Improved  2. COPD exacerbation with bullous emphysema  3. Lactic acidosis secondary to multiple breathing treatments  4. Acute renal failure, improving  5. Hyperkalemia secondary to renal failure.  Improved  6. SIRS.  No evidence of pneumonia on CT chest  7. Non-insulin-dependent type 2 diabetes, uncontrolled, with hyperglycemia.  Hemoglobin A1c 9.5  8. Chronic normocytic anemia with baseline hemoglobin between 12-13     Recommendations:     ?? Discharged on tapering course of prednisone  ?? Given prescription for doxycycline and inhaler therapy     Hospital course and discharge diagnoses were discussed with the patient. Patient is copacetic with discharge plan. I answered any questions that the patient and/or family had.         Follow-up Information     Follow up With Specialties Details Why Contact Info    Lake Bells, MD [facility] In 1 week  [place] [address]  [phone]            Current Discharge Medication List       START taking these medications    Details   guaiFENesin ER (MUCINEX) 600 mg ER tablet Take 1 Tab by mouth two (2) times a day for 5 days.  Qty: 10 Tab, Refills: 0      predniSONE (DELTASONE) 10 mg tablet Take 40 mg by mouth daily (with breakfast) for 3 days, THEN 30 mg daily (with breakfast) for 3 days, THEN 20 mg daily (with breakfast) for 3 days, THEN 10 mg daily (with breakfast) for 3 days.  Qty: 30 Tab, Refills: 0      tiotropium bromide (SPIRIVA RESPIMAT) 2.5 mcg/actuation inhaler Take 1 Puff by inhalation daily.  Qty: 1 Inhaler, Refills: 0      fluticasone-salmeterol (ADVAIR DISKUS) 100-50 mcg/dose diskus inhaler Take 1 Puff by inhalation two (2) times a day.  Qty: 1 Inhaler, Refills: 0         CONTINUE these medications which have CHANGED    Details   traZODone (DESYREL) 50 mg tablet Take 1 Tab by mouth nightly as needed for Sleep.  Qty: 20 Tab, Refills: 0      albuterol (PROVENTIL HFA, VENTOLIN HFA, PROAIR HFA) 90 mcg/actuation inhaler Take 2 Puffs by inhalation every six (6) hours for 5 days, THEN 2 Puffs every four (4) hours as needed for Wheezing or Shortness of Breath for up to  30 days.  Qty: 1 Inhaler, Refills: 0         CONTINUE these medications which have NOT CHANGED    Details   nabumetone (RELAFEN) 750 mg tablet TAKE 1 TABLET BY MOUTH TWICE DAILY AS NEEDED FOR PAIN. USE SPARINGLY  Qty: 30 Tab, Refills: 0    Associated Diagnoses: Right hip pain      losartan (COZAAR) 100 mg tablet TAKE 1 TABLET BY MOUTH DAILY FOR HYPERTENSION  Qty: 90 Tab, Refills: 0    Associated Diagnoses: Severe hypertension      QUEtiapine SR (SEROQUEL XR) 150 mg sr tablet TAKE 1 TABLET BY MOUTH NIGHTLY  Qty: 90 Tab, Refills: 1    Associated Diagnoses: Medication refill      amLODIPine (NORVASC) 10 mg tablet TAKE ONE TABLET BY MOUTH ONCE DAILY  Qty: 90 Tab, Refills: 1    Associated Diagnoses: Essential hypertension with goal blood pressure less than 140/90      rosuvastatin (CRESTOR) 5 mg tablet TAKE 1 TABLET BY MOUTH NIGHTLY   Qty: 90 Tab, Refills: 1    Associated Diagnoses: Type 2 diabetes mellitus with diabetic neuropathy, without long-term current use of insulin (HCC)      metFORMIN ER (GLUCOPHAGE XR) 500 mg tablet 2 tabs BID  Qty: 360 Tab, Refills: 1    Associated Diagnoses: Type 2 diabetes mellitus with diabetic neuropathy, without long-term current use of insulin (HCC)      albuterol (ACCUNEB) 1.25 mg/3 mL nebu 3 mL by Nebulization route every six (6) hours as needed.  Qty: 100 Each, Refills: 0      spironolactone (ALDACTONE) 100 mg tablet TAKE 1 TABLET BY MOUTH DAILY  Qty: 90 Tab, Refills: 0    Associated Diagnoses: Severe hypertension      polyethylene glycol (MIRALAX) 17 gram packet Take 1 Packet by mouth daily.  Qty: 100 Packet, Refills: 1      magnesium oxide (MAG-OX) 400 mg tablet TAKE 1 TABLET BY MOUTH DAILY  Qty: 90 Tab, Refills: 0    Associated Diagnoses: Hypomagnesemia      triamcinolone (NASACORT) 55 mcg nasal inhaler 2 Sprays by Both Nostrils route daily.  Qty: 1 Bottle, Refills: 0    Associated Diagnoses: Mild intermittent asthma, unspecified whether complicated; Chronic rhinitis      montelukast (SINGULAIR) 10 mg tablet TAKE 1 TABLET BY MOUTH DAILY  Qty: 90 Tab, Refills: 1    Comments: **Patient requests 90 days supply**  Associated Diagnoses: Mild intermittent asthma, unspecified whether complicated; Chronic rhinitis      benzonatate (TESSALON PERLES) 100 mg capsule Take 1 Cap by mouth three (3) times daily as needed for Cough.  Qty: 20 Cap, Refills: 0      Blood-Glucose Meter monitoring kit Check sugars 2 times per day, fasting and 2 hours after dinner  Qty: 1 Kit, Refills: 0    Associated Diagnoses: Diabetes mellitus, new onset (HCC)      Lancets misc Check sugars twice daily  Qty: 200 Each, Refills: 2    Associated Diagnoses: Diabetes mellitus, new onset (HCC)      glucose blood VI test strips (ASCENSIA AUTODISC VI, ONE TOUCH ULTRA TEST VI) strip Check sugars twice daily  Qty: 100 Strip, Refills: 5     Associated Diagnoses: Diabetes mellitus, new onset ([place])             Condition at discharge:  Afebrile  Ambulating  Eating, Drinking, Voiding  Stable    Operative Procedures:    None.  Consultants/Treatment Team:    Treatment Team: Attending Provider: Einar Grad, MD; Consulting Provider: Knox Saliva, MD; Consulting Provider: Other, Phys, MD; Hospitalist: Einar Grad, MD    Most Recent Labs:  Recent Results (from the past 24 hour(s))   LACTIC ACID    Collection Time: 08/08/17  4:37 PM   Result Value Ref Range    Lactic Acid 3.5 (H) 0.4 - 2.0 mmol/L   GLUCOSE, POC    Collection Time: 08/08/17  4:40 PM   Result Value Ref Range    Glucose (POC) 195 (H) 65 - 105 mg/dL   GLUCOSE, POC    Collection Time: 08/08/17  9:37 PM   Result Value Ref Range    Glucose (POC) 159 (H) 65 - 105 mg/dL   LACTIC ACID    Collection Time: 08/09/17  4:12 AM   Result Value Ref Range    Lactic Acid 0.9 0.4 - 2.0 mmol/L   CBC W/O DIFF    Collection Time: 08/09/17  4:12 AM   Result Value Ref Range    WBC 13.6 (H) 4.0 - 11.0 1000/mm3    RBC 4.14 3.60 - 5.20 M/uL    HGB 11.9 (L) 13.0 - 17.2 gm/dl    HCT 36.7 (L) 37.0 - 50.0 %    MCV 88.6 80.0 - 98.0 fL    MCH 28.7 25.4 - 34.6 pg    MCHC 32.4 30.0 - 36.0 gm/dl    PLATELET 217 140 - 450 1000/mm3    MPV 10.7 (H) 6.0 - 10.0 fL    RDW-SD 41.5 36.4 - 22.0     METABOLIC PANEL, BASIC    Collection Time: 08/09/17  4:12 AM   Result Value Ref Range    Sodium 140 136 - 145 mEq/L    Potassium 3.5 3.5 - 5.1 mEq/L    Chloride 106 98 - 107 mEq/L    CO2 27 21 - 32 mEq/L    Glucose 168 (H) 74 - 106 mg/dl    BUN 16 7 - 25 mg/dl    Creatinine 0.9 0.6 - 1.3 mg/dl    GFR est AA >60.0      GFR est non-AA >60      Calcium 8.8 8.5 - 10.1 mg/dl    Anion gap 8 5 - 15 mmol/L   GLUCOSE, POC    Collection Time: 08/09/17  7:54 AM   Result Value Ref Range    Glucose (POC) 155 (H) 65 - 105 mg/dL         XR Results:  Results from [facility] encounter on 08/06/17   XR CHEST PA LAT     Narrative Indication: Respiratory distress.      Impression IMPRESSION: Chronic obstructive lung disease. Left pulmonary nodules.    Comment: AP and lateral views chest reveal the lungs are hyperaerated from  chronic obstructive disease. Bullous disease is present bilaterally. Left  pulmonary nodules are again observed unchanged from June 15, 2017. The heart  is of normal size.         CT Results:  Results from [place] encounter on 08/06/17   CT CHEST WO CONT    Narrative Indication: Respiratory distress. Hypoxia. Elevated white blood count. Diabetes.  Lung nodules.      Impression IMPRESSION: Bullous emphysema. Stable left lower lobe 18 mm nodule. Stable tiny  peripheral left upper lobe nodules. 2 cm right thyroid nodule. Tiny  nonobstructing right renal calculus.    Comment: CT images of the chest were  obtained without intravenous contrast. This  was compared with Nov 03, 2016 and July 23, 2016.    A 2 cm right thyroid nodule is present.    There is diffuse bullous emphysema with large bullae bilaterally.    An 18 x 16 mm left lower lobe nodule is again seen, unchanged from prior study.  In addition there are tiny peripheral left upper lobe nodules also unchanged.    No pulmonary consolidation.    The heart is of normal size. The aorta is of normal caliber.    No pleural or pericardial effusion.    A tiny nonobstructing right renal calculus is noted.    DICOM format imaged data is available to non-affiliated external healthcare  facilities or entities on a secure, media free, reciprocally searchable basis  with patient authorization  for 12 months following the date of the study.Marland Kitchen         MRI Results:  Results from [place] encounter on 04/07/15   MRI BRAIN W WO CONT    Narrative CLINICAL HISTORY:  Abnormal CT, meningioma    PROCEDURE: MRI brain without and with contrast    COMPARISON: Head CT dated 04/08/2015    FINDINGS:     There is age-related cerebral atrophy. There is extensive chronic small vessel  ischemic change, advanced for patient age. As noted on CT, there is a enhancing  extra-axial mass in the left CP angle along the petrous temporal bone measuring  10 mm. This is observed on image 9 series 12, image 6 series 11 and image 21  series 13. There are dural tails extending to the tentorium cerebelli. Imaging  findings would favor meningioma. No other areas of abnormal brain parenchymal  enhancement. The sella, pineal region, craniocervical junction are unremarkable.  Skull base flow voids are patent.      Impression IMPRESSION:  1. Enhancing mass in the left posterior fossa adjacent to the petrous temporal  bone as described above. Main consideration would include meningioma. Other less  likely differential considerations would include dural metastasis and lymphoma.  Consider surveillance or further evaluation.    2.  Chronic small vessel ischemic changes, advanced for patient age.         Nuclear Medicine Results:  No results found for this or any previous visit.    [place] Results:  Results from [place] encounter on 12/22/15   US GUIDE FINE NDL ASP W IMAGE    Narrative Clinical History: Right thyroid nodule    Procedure: Ultrasound guided fine needle biopsy of  right thyroid gland nodule    Radiologist: Posey Pronto    Anesthesia: Local    Procedure:  Benefits, risks and possible complications were discussed in detail with the  patient and informed consent obtained. Patient's neck was prepped and draped in  sterile manner. Lidocaine was administered to obtain local anesthesia.    Under ultrasound guidance 18, 20 and 25 gauge hypodermic needle was advanced  into the thyroid nodule and fine needle aspirates obtained. 4 fine needle  aspirates were obtained and submitted for histopathologic evaluation.    Hemostasis was achieved with manual compression. Patient tolerated procedure   well. There were no immediate postprocedural complications.      Impression Impression:   Successful 4 fine needle aspirates of right thyroid cyst performed under  ultrasound guidance. Lesion is cystic, then may be absence of cellularity and  results nondiagnostic, 4 mm of amber fluid aspirated. 2 attempts were made to  obtain cells from wall  of the cyst.         IR Results:  No results found for this or any previous visit.    VAS/US Results:  No results found for this or any previous visit.      Total discharge time 25 minutes.     Portions of this electronic record were dictated using Systems analyst. Unintended errors in translation may occur.        Einar Grad, MD  [facility]  August 09, 2017  12:12 PM

## 2017-08-09 NOTE — Progress Notes (Signed)
Pt discharged to home. Pt gave verbal understanding of discharge instructions. Pt aware of follow up appointments and scripts. Pt has taken all belongings on discharge. [facility]

## 2017-08-09 NOTE — Discharge Summary (Signed)
Discharge Summary by Einar Grad, MD at 08/09/17 1212                Author: Einar Grad, MD  Service: Hospitalist  Author Type: Physician       Filed: 08/09/17 1215  Date of Service: 08/09/17 1212  Status: Signed          Editor: Einar Grad, MD (Physician)                  Discharge Summary                   Patient ID:   [name] [name], 67 y.o., female   DOB: 09-30-1950      PCP:  Lake Bells, MD      Admit Date: 08/06/2017  8:10 PM   Discharge Date:  No discharge date for patient encounter.   Length of stay: 2 day(s)   Discharge Disposition: Home or Self Care         Chief Complaint       Patient presents with        ?  Respiratory Distress             Hospital Course:         Patient is a 67 year old female with known emphysema who comes into the hospital with acute shortness of breath, cough, congestion.  She was found to have COPD exacerbation  and started on IV steroids, breathing treatments, and doxycycline.  She also had acute renal failure likely secondary to dehydration and was given IV fluids. She had improvement in her dyspnea and renal failure. She will be discharged with taper of prednisone.         Discharge Diagnoses:   1.  Acute hypoxic/hypercapnic respiratory failure secondary to COPD exacerbation.  Improved   2.  COPD exacerbation with bullous emphysema   3.  Lactic acidosis secondary to multiple breathing treatments   4.  Acute renal failure, improving   5.  Hyperkalemia secondary to renal failure.  Improved   6.  SIRS.  No evidence of pneumonia on CT chest   7.  Non-insulin-dependent type 2 diabetes, uncontrolled, with hyperglycemia.  Hemoglobin A1c 9.5   8.  Chronic normocytic anemia with baseline hemoglobin between 12-13          Recommendations:        ??  Discharged on tapering course of prednisone   ??  Given prescription for doxycycline and inhaler therapy         Hospital course and discharge diagnoses were discussed with the patient. Patient is copacetic with discharge  plan. I answered any questions that the patient  and/or family had.               Follow-up Information               Follow up With  Specialties  Details  Why  Contact Info              Lake Bells, MD  Family Practice  In 1 week    [place] [address]   [phone]                     Current Discharge Medication List              START taking these medications  Details        guaiFENesin ER (MUCINEX) 600 mg ER tablet  Take 1 Tab by mouth two (2) times a day for 5 days.   Qty: 10 Tab, Refills:  0               predniSONE (DELTASONE) 10 mg tablet  Take 40 mg by mouth daily (with breakfast) for 3 days, THEN 30 mg daily (with breakfast) for 3 days, THEN 20 mg daily (with breakfast) for 3 days, THEN  10 mg daily (with breakfast) for 3 days.   Qty: 30 Tab, Refills:  0               tiotropium bromide (SPIRIVA RESPIMAT) 2.5 mcg/actuation inhaler  Take 1 Puff by inhalation daily.   Qty: 1 Inhaler, Refills:  0               fluticasone-salmeterol (ADVAIR DISKUS) 100-50 mcg/dose diskus inhaler  Take 1 Puff by inhalation two (2) times a day.   Qty: 1 Inhaler, Refills:  0                     CONTINUE these medications which have CHANGED          Details        traZODone (DESYREL) 50 mg tablet  Take 1 Tab by mouth nightly as needed for Sleep.   Qty: 20 Tab, Refills:  0               albuterol (PROVENTIL HFA, VENTOLIN HFA, PROAIR HFA) 90 mcg/actuation inhaler  Take 2 Puffs by inhalation every six (6) hours for 5 days, THEN 2 Puffs every four (4) hours as needed for Wheezing or Shortness of Breath for up to 30  days.   Qty: 1 Inhaler, Refills:  0                     CONTINUE these medications which have NOT CHANGED          Details        nabumetone (RELAFEN) 750 mg tablet  TAKE 1 TABLET BY MOUTH TWICE DAILY AS NEEDED FOR PAIN. USE SPARINGLY   Qty: 30 Tab, Refills:  0          Associated Diagnoses: Right hip pain               losartan (COZAAR) 100  mg tablet  TAKE 1 TABLET BY MOUTH DAILY FOR HYPERTENSION   Qty: 90 Tab, Refills:  0          Associated Diagnoses: Severe hypertension               QUEtiapine SR (SEROQUEL XR) 150 mg sr tablet  TAKE 1 TABLET BY MOUTH NIGHTLY   Qty: 90 Tab, Refills:  1          Associated Diagnoses: Medication refill               amLODIPine (NORVASC) 10 mg tablet  TAKE ONE TABLET BY MOUTH ONCE DAILY   Qty: 90 Tab, Refills:  1          Associated Diagnoses: Essential hypertension with goal blood pressure less than 140/90               rosuvastatin (CRESTOR) 5 mg tablet  TAKE 1 TABLET BY MOUTH NIGHTLY   Qty: 90 Tab, Refills:  1          Associated Diagnoses: Type 2  diabetes mellitus with diabetic neuropathy, without long-term current  use of insulin (HCC)               metFORMIN ER (GLUCOPHAGE XR) 500 mg tablet  2 tabs BID   Qty: 360 Tab, Refills:  1          Associated Diagnoses: Type 2 diabetes mellitus with diabetic neuropathy, without long-term current  use of insulin (HCC)               albuterol (ACCUNEB) 1.25 mg/3 mL nebu  3 mL by Nebulization route every six (6) hours as needed.   Qty: 100 Each, Refills:  0               spironolactone (ALDACTONE) 100 mg tablet  TAKE 1 TABLET BY MOUTH DAILY   Qty: 90 Tab, Refills:  0          Associated Diagnoses: Severe hypertension               polyethylene glycol (MIRALAX) 17 gram packet  Take 1 Packet by mouth daily.   Qty: 100 Packet, Refills:  1               magnesium oxide (MAG-OX) 400 mg tablet  TAKE 1 TABLET BY MOUTH DAILY   Qty: 90 Tab, Refills:  0          Associated Diagnoses: Hypomagnesemia               triamcinolone (NASACORT) 55 mcg nasal inhaler  2 Sprays by Both Nostrils route daily.   Qty: 1 Bottle, Refills:  0          Associated Diagnoses: Mild intermittent asthma, unspecified whether complicated; Chronic rhinitis               montelukast (SINGULAIR) 10 mg tablet  TAKE 1 TABLET BY MOUTH DAILY   Qty: 90 Tab, Refills:  1          Comments: **Patient requests 90 days  supply**   Associated Diagnoses: Mild intermittent asthma, unspecified whether complicated; Chronic rhinitis               benzonatate (TESSALON PERLES) 100 mg capsule  Take 1 Cap by mouth three (3) times daily as needed for Cough.   Qty: 20 Cap, Refills:  0               Blood-Glucose Meter monitoring kit  Check sugars 2 times per day, fasting and 2 hours after dinner   Qty: 1 Kit, Refills:  0          Associated Diagnoses: Diabetes mellitus, new onset (HCC)               Lancets misc  Check sugars twice daily   Qty: 200 Each, Refills:  2          Associated Diagnoses: Diabetes mellitus, new onset (HCC)               glucose blood VI test strips (ASCENSIA AUTODISC VI, ONE TOUCH ULTRA TEST VI) strip  Check sugars twice daily   Qty: 100 Strip, Refills:  5          Associated Diagnoses: Diabetes mellitus, new onset ([place])                         Condition at discharge:   Afebrile   Ambulating   Eating, Drinking, Voiding   Stable  Operative Procedures:     None.      Consultants/Treatment Team:     Treatment Team: Attending Provider: Einar Grad, MD; Consulting Provider: Knox Saliva, MD; Consulting Provider: Other, Phys, MD; Hospitalist: Einar Grad, MD      Most Recent Labs:     Recent Results (from the past 24 hour(s))     LACTIC ACID          Collection Time: 08/08/17  4:37 PM         Result  Value  Ref Range            Lactic Acid  3.5 (H)  0.4 - 2.0 mmol/L       GLUCOSE, POC          Collection Time: 08/08/17  4:40 PM         Result  Value  Ref Range            Glucose (POC)  195 (H)  65 - 105 mg/dL       GLUCOSE, POC          Collection Time: 08/08/17  9:37 PM         Result  Value  Ref Range            Glucose (POC)  159 (H)  65 - 105 mg/dL       LACTIC ACID          Collection Time: 08/09/17  4:12 AM         Result  Value  Ref Range            Lactic Acid  0.9  0.4 - 2.0 mmol/L       CBC W/O DIFF          Collection Time: 08/09/17  4:12 AM         Result  Value  Ref Range            WBC  13.6 (H)   4.0 - 11.0 1000/mm3       RBC  4.14  3.60 - 5.20 M/uL       HGB  11.9 (L)  13.0 - 17.2 gm/dl       HCT  36.7 (L)  37.0 - 50.0 %       MCV  88.6  80.0 - 98.0 fL       MCH  28.7  25.4 - 34.6 pg       MCHC  32.4  30.0 - 36.0 gm/dl       PLATELET  217  140 - 450 1000/mm3       MPV  10.7 (H)  6.0 - 10.0 fL       RDW-SD  41.5  36.4 - 78.2         METABOLIC PANEL, BASIC          Collection Time: 08/09/17  4:12 AM         Result  Value  Ref Range            Sodium  140  136 - 145 mEq/L       Potassium  3.5  3.5 - 5.1 mEq/L       Chloride  106  98 - 107 mEq/L       CO2  27  21 - 32 mEq/L            Glucose  168 (H)  74 - 106 mg/dl  BUN  16  7 - 25 mg/dl       Creatinine  0.9  0.6 - 1.3 mg/dl       GFR est AA  >60.0          GFR est non-AA  >60          Calcium  8.8  8.5 - 10.1 mg/dl       Anion gap  8  5 - 15 mmol/L       GLUCOSE, POC          Collection Time: 08/09/17  7:54 AM         Result  Value  Ref Range            Glucose (POC)  155 (H)  65 - 105 mg/dL              XR Results:     Results from [facility] encounter on 08/06/17     XR CHEST PA LAT           Narrative  Indication: Respiratory distress.              Impression  IMPRESSION: Chronic obstructive lung disease. Left pulmonary nodules.      Comment: AP and lateral views chest reveal the lungs are hyperaerated from   chronic obstructive disease. Bullous disease is present bilaterally. Left   pulmonary nodules are again observed unchanged from June 15, 2017. The heart   is of normal size.              CT Results:     Results from [facility] encounter on 08/06/17     CT CHEST WO CONT           Narrative  Indication: Respiratory distress. Hypoxia. Elevated white blood count. Diabetes.   Lung nodules.              Impression  IMPRESSION: Bullous emphysema. Stable left lower lobe 18 mm nodule. Stable tiny   peripheral left upper lobe nodules. 2 cm right thyroid nodule. Tiny   nonobstructing right renal calculus.      Comment: CT images of  the chest were obtained without intravenous contrast. This   was compared with Nov 03, 2016 and July 23, 2016.      A 2 cm right thyroid nodule is present.      There is diffuse bullous emphysema with large bullae bilaterally.      An 18 x 16 mm left lower lobe nodule is again seen, unchanged from prior study.   In addition there are tiny peripheral left upper lobe nodules also unchanged.      No pulmonary consolidation.      The heart is of normal size. The aorta is of normal caliber.      No pleural or pericardial effusion.      A tiny nonobstructing right renal calculus is noted.      DICOM format imaged data is available to non-affiliated external healthcare   facilities or entities on a secure, media free, reciprocally searchable basis   with patient authorization  for 12 months following the date of the study.Marland Kitchen              MRI Results:     Results from [facility] encounter on 04/07/15     MRI BRAIN W WO CONT           Narrative  CLINICAL HISTORY:  Abnormal CT, meningioma  PROCEDURE: MRI brain without and with contrast      COMPARISON: Head CT dated 04/08/2015      FINDINGS:      There is age-related cerebral atrophy. There is extensive chronic small vessel   ischemic change, advanced for patient age. As noted on CT, there is a enhancing   extra-axial mass in the left CP angle along the petrous temporal bone measuring   10 mm. This is observed on image 9 series 12, image 6 series 11 and image 21   series 13. There are dural tails extending to the tentorium cerebelli. Imaging   findings would favor meningioma. No other areas of abnormal brain parenchymal   enhancement. The sella, pineal region, craniocervical junction are unremarkable.   Skull base flow voids are patent.              Impression  IMPRESSION:   1. Enhancing mass in the left posterior fossa adjacent to the petrous temporal   bone as described above. Main consideration would include meningioma. Other less   likely differential  considerations would include dural metastasis and lymphoma.   Consider surveillance or further evaluation.      2.  Chronic small vessel ischemic changes, advanced for patient age.              Nuclear Medicine Results:   No results found for this or any previous visit.      [place] Results:     Results from [facility] encounter on 12/22/15     US GUIDE FINE NDL ASP W IMAGE           Narrative  Clinical History: Right thyroid nodule      Procedure: Ultrasound guided fine needle biopsy of  right thyroid gland nodule      Radiologist: Posey Pronto      Anesthesia: Local      Procedure:   Benefits, risks and possible complications were discussed in detail with the   patient and informed consent obtained. Patient's neck was prepped and draped in   sterile manner. Lidocaine was administered to obtain local anesthesia.      Under ultrasound guidance 18, 20 and 25 gauge hypodermic needle was advanced   into the thyroid nodule and fine needle aspirates obtained. 4 fine needle   aspirates were obtained and submitted for histopathologic evaluation.      Hemostasis was achieved with manual compression. Patient tolerated procedure   well. There were no immediate postprocedural complications.              Impression  Impression:    Successful 4 fine needle aspirates of right thyroid cyst performed under   ultrasound guidance. Lesion is cystic, then may be absence of cellularity and   results nondiagnostic, 4 mm of amber fluid aspirated. 2 attempts were made to   obtain cells from wall of the cyst.              IR Results:   No results found for this or any previous visit.      VAS/US Results:   No results found for this or any previous visit.         Total discharge time 25 minutes.       Portions of this electronic record were dictated using Systems analyst. Unintended errors in translation may occur.            Einar Grad, MD   [facility]   August 09, 2017   12:12 PM

## 2017-08-11 LAB — CULTURE, RESPIRATORY/SPUTUM/BRONCH W GRAM STAIN

## 2017-08-12 LAB — CULTURE, BLOOD
Blood Culture Result: NO GROWTH
Blood Culture Result: NO GROWTH

## 2017-08-14 ENCOUNTER — Encounter

## 2017-08-14 MED ORDER — NABUMETONE 750 MG TAB
750 mg | ORAL_TABLET | ORAL | 0 refills | Status: DC
Start: 2017-08-14 — End: 2017-11-09

## 2017-08-14 MED ORDER — CLONIDINE 0.3 MG TAB
0.3 mg | ORAL_TABLET | ORAL | 1 refills | Status: DC
Start: 2017-08-14 — End: 2017-10-11

## 2017-08-14 MED ORDER — MAGNESIUM OXIDE 400 MG TAB
400 mg | ORAL_TABLET | ORAL | 0 refills | Status: DC
Start: 2017-08-14 — End: 2018-02-18

## 2017-08-15 ENCOUNTER — Encounter: Attending: Physician Assistant | Primary: Physician Assistant

## 2017-08-16 ENCOUNTER — Encounter

## 2017-08-16 MED ORDER — SPIRONOLACTONE 100 MG TAB
100 mg | ORAL_TABLET | ORAL | 0 refills | Status: DC
Start: 2017-08-16 — End: 2017-12-20

## 2017-08-16 MED ORDER — MONTELUKAST 10 MG TAB
10 mg | ORAL_TABLET | ORAL | 3 refills | Status: DC
Start: 2017-08-16 — End: 2017-12-20

## 2017-08-16 NOTE — Progress Notes (Signed)
Hospital Discharge Follow-Up      Date/Time:  08/16/2017 1:18 PM    Patient was admitted to [facility] on 08/06/17 and discharged on 08/09/17 for COPD exacerbation. The physician discharge summary was available at the time of outreach.  Nurse Navigator attempted to contact patient.   Message left introducing myself, the purpose of the call and giving my contact information.  Requested that patient call back at her earliest convenience    Current Outpatient Medications   Medication Sig   ??? nabumetone (RELAFEN) 750 mg tablet TAKE 1 TABLET BY MOUTH TWICE DAILY AS NEEDED FOR PAIN. USE SPARINGLY   ??? cloNIDine HCl (CATAPRES) 0.3 mg tablet TAKE ONE TABLET BY MOUTH TWICE DAILY   ??? magnesium oxide (MAG-OX) 400 mg tablet TAKE 1 TABLET BY MOUTH DAILY   ??? traZODone (DESYREL) 50 mg tablet Take 1 Tab by mouth nightly as needed for Sleep.   ??? predniSONE (DELTASONE) 10 mg tablet Take 40 mg by mouth daily (with breakfast) for 3 days, THEN 30 mg daily (with breakfast) for 3 days, THEN 20 mg daily (with breakfast) for 3 days, THEN 10 mg daily (with breakfast) for 3 days.   ??? tiotropium bromide (SPIRIVA RESPIMAT) 2.5 mcg/actuation inhaler Take 1 Puff by inhalation daily.   ??? albuterol (PROVENTIL HFA, VENTOLIN HFA, PROAIR HFA) 90 mcg/actuation inhaler Take 2 Puffs by inhalation every six (6) hours for 5 days, THEN 2 Puffs every four (4) hours as needed for Wheezing or Shortness of Breath for up to 30 days.   ??? fluticasone-salmeterol (ADVAIR DISKUS) 100-50 mcg/dose diskus inhaler Take 1 Puff by inhalation two (2) times a day.   ??? losartan (COZAAR) 100 mg tablet TAKE 1 TABLET BY MOUTH DAILY FOR HYPERTENSION   ??? QUEtiapine SR (SEROQUEL XR) 150 mg sr tablet TAKE 1 TABLET BY MOUTH NIGHTLY   ??? amLODIPine (NORVASC) 10 mg tablet TAKE ONE TABLET BY MOUTH ONCE DAILY   ??? rosuvastatin (CRESTOR) 5 mg tablet TAKE 1 TABLET BY MOUTH NIGHTLY   ??? metFORMIN ER (GLUCOPHAGE XR) 500 mg tablet 2 tabs BID    ??? albuterol (ACCUNEB) 1.25 mg/3 mL nebu 3 mL by Nebulization route every six (6) hours as needed.   ??? spironolactone (ALDACTONE) 100 mg tablet TAKE 1 TABLET BY MOUTH DAILY   ??? polyethylene glycol (MIRALAX) 17 gram packet Take 1 Packet by mouth daily.   ??? triamcinolone (NASACORT) 55 mcg nasal inhaler 2 Sprays by Both Nostrils route daily.   ??? montelukast (SINGULAIR) 10 mg tablet TAKE 1 TABLET BY MOUTH DAILY   ??? benzonatate (TESSALON PERLES) 100 mg capsule Take 1 Cap by mouth three (3) times daily as needed for Cough.   ??? Blood-Glucose Meter monitoring kit Check sugars 2 times per day, fasting and 2 hours after dinner   ??? Lancets misc Check sugars twice daily   ??? glucose blood VI test strips (ASCENSIA AUTODISC VI, ONE TOUCH ULTRA TEST VI) strip Check sugars twice daily     No current facility-administered medications for this visit.        There are no discontinued medications.    [facility] follow up appointment(s):   Future Appointments   Date Time Provider Nambe   08/22/2017  1:30 PM Wallene Huh, PA-C [place]      Goals     None

## 2017-08-19 ENCOUNTER — Encounter

## 2017-08-22 ENCOUNTER — Encounter: Attending: Physician Assistant | Primary: Physician Assistant

## 2017-08-23 ENCOUNTER — Ambulatory Visit: Payer: MEDICARE | Primary: Physician Assistant

## 2017-08-29 ENCOUNTER — Ambulatory Visit
Admit: 2017-08-29 | Discharge: 2017-08-29 | Payer: MEDICARE | Attending: Physician Assistant | Primary: Physician Assistant

## 2017-08-29 DIAGNOSIS — J441 Chronic obstructive pulmonary disease with (acute) exacerbation: Secondary | ICD-10-CM

## 2017-08-29 MED ORDER — KETOROLAC TROMETHAMINE 60 MG/2 ML IM
60 mg/2 mL | Freq: Once | INTRAMUSCULAR | 0 refills | Status: AC
Start: 2017-08-29 — End: 2017-08-29

## 2017-08-29 NOTE — Progress Notes (Signed)
Anne ShutterMonica R Sprunger is a 67 y.o. female  Hospital f/u for COPD. Fall Risk fell last night out of bed hurt right leg.

## 2017-08-29 NOTE — Progress Notes (Signed)
HISTORY OF PRESENT ILLNESS  [name] [name] is a 67 y.o. female.  HPI  [name] [name] is a 67 y.o. female who presents to the office today for hospital follow up.  She is new to me. Her PCP was Dr. Gaetano Net. She is here to follow up after hospitalization at [facility] for COPD exacerbation. She was admitted on 08/06/17 and discharged on 08/09/17. On presentation she was found to have an acute COPD exacerbation. She was started on IV steroids, Neb treatments, and doxycycline. Labs showed she had acute renal failure 2/2 to dehydration, which improved with IV fluids. She was discharged home with oral steroids and doxy.   Today she states she feels much better. Breathing is stable. She is taking Advair BID.     Her main concern today is the pain in her right hip and leg. 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 She was seeing a couple different pain management doctors, Dr. Belia Heman and Dr. Leron Croak. She has also been referred to other pain management facilities by Dr. Gaetano Net, but no one will take her on as a patient because she was discharged from other facilities for marijuana found on UDS. She did accidentally fall out of bed last night and this exacerbated her right leg and right hip pain.     Chief Complaint   Patient presents with   ??? COPD     hospital Follow up   Current Outpatient Medications on File Prior to Visit   Medication Sig Dispense Refill   ??? montelukast (SINGULAIR) 10 mg tablet TAKE 1 TABLET BY MOUTH DAILY 90 Tab 3   ??? nabumetone (RELAFEN) 750 mg tablet TAKE 1 TABLET BY MOUTH TWICE DAILY AS NEEDED FOR PAIN. USE SPARINGLY 30 Tab 0   ??? cloNIDine HCl (CATAPRES) 0.3 mg tablet TAKE ONE TABLET BY MOUTH TWICE DAILY 180 Tab 1   ??? magnesium oxide (MAG-OX) 400 mg tablet TAKE 1 TABLET BY MOUTH DAILY 90 Tab 0   ??? traZODone (DESYREL) 50 mg tablet Take 1 Tab by mouth nightly as needed for Sleep. 20 Tab 0   ??? fluticasone-salmeterol (ADVAIR DISKUS) 100-50 mcg/dose diskus inhaler  Take 1 Puff by inhalation two (2) times a day. 1 Inhaler 0   ??? losartan (COZAAR) 100 mg tablet TAKE 1 TABLET BY MOUTH DAILY FOR HYPERTENSION 90 Tab 0   ??? QUEtiapine SR (SEROQUEL XR) 150 mg sr tablet TAKE 1 TABLET BY MOUTH NIGHTLY 90 Tab 1   ??? amLODIPine (NORVASC) 10 mg tablet TAKE ONE TABLET BY MOUTH ONCE DAILY 90 Tab 1   ??? rosuvastatin (CRESTOR) 5 mg tablet TAKE 1 TABLET BY MOUTH NIGHTLY 90 Tab 1   ??? metFORMIN ER (GLUCOPHAGE XR) 500 mg tablet 2 tabs BID 360 Tab 1   ??? polyethylene glycol (MIRALAX) 17 gram packet Take 1 Packet by mouth daily. 100 Packet 1   ??? triamcinolone (NASACORT) 55 mcg nasal inhaler 2 Sprays by Both Nostrils route daily. 1 Bottle 0   ??? benzonatate (TESSALON PERLES) 100 mg capsule Take 1 Cap by mouth three (3) times daily as needed for Cough. 20 Cap 0   ??? Blood-Glucose Meter monitoring kit Check sugars 2 times per day, fasting and 2 hours after dinner 1 Kit 0   ??? Lancets misc Check sugars twice daily 200 Each 2   ??? glucose blood VI test strips (ASCENSIA AUTODISC VI, ONE TOUCH ULTRA TEST VI) strip Check sugars twice daily 100 Strip 5   ???  spironolactone (ALDACTONE) 100 mg tablet TAKE 1 TABLET BY MOUTH DAILY 90 Tab 0   ??? tiotropium bromide (SPIRIVA RESPIMAT) 2.5 mcg/actuation inhaler Take 1 Puff by inhalation daily. 1 Inhaler 0   ??? albuterol (PROVENTIL HFA, VENTOLIN HFA, PROAIR HFA) 90 mcg/actuation inhaler Take 2 Puffs by inhalation every six (6) hours for 5 days, THEN 2 Puffs every four (4) hours as needed for Wheezing or Shortness of Breath for up to 30 days. 1 Inhaler 0   ??? albuterol (ACCUNEB) 1.25 mg/3 mL nebu 3 mL by Nebulization route every six (6) hours as needed. 100 Each 0     No current facility-administered medications on file prior to visit.    Allergies   Allergen Reactions   ??? Chocolate [Cocoa] Sneezing   Past Medical History:   Diagnosis Date   ??? Arthritis    ??? Chronic pain syndrome    ??? COPD (chronic obstructive pulmonary disease) ([place])    ??? DM2 (diabetes mellitus, type 2) ([place])     ??? GERD (gastroesophageal reflux disease)    ??? Hepatitis C     HEP C   ??? HTN (hypertension)    ??? Lung nodule, multiple     (CT 10/2016) Small left upper lobe and 1.7 x 1.6 cm left lower lobe nodules not significantly changed from 07/23/2016 and 03/22/2016   ??? Marijuana use    ??? PTSD (post-traumatic stress disorder)     lived through the [place] in 1993   ??? Thoracic ascending aortic aneurysm ([place])     4.4cm noted on CT Chest (10/2016)   ??? Thyroid nodule     2.5 cm stable right thyroid nodule (CT 10/2016)   Social History     Tobacco Use   Smoking Status Never Smoker   Smokeless Tobacco Never Used   Social History     Substance and Sexual Activity   Alcohol Use No   Family History   Problem Relation Age of Onset   ??? Hypertension Mother    ??? Cancer Maternal Aunt    ??? Alcohol abuse Maternal Grandfather      Review of Systems   Constitutional: Negative for fever and weight loss.   Respiratory: Negative for cough, sputum production, shortness of breath and wheezing.    Cardiovascular: Negative for chest pain and palpitations.   Musculoskeletal: Positive for back pain, falls and joint pain.        Right hip pain with radiation down right leg   Neurological: Negative for dizziness.     Visit Vitals  BP 101/70 (BP 1 Location: Left arm, BP Patient Position: Sitting)   Pulse (!) 101   Temp 97.8 ??F (36.6 ??C) (Oral)   Ht 5' 8"  (1.727 m)   Wt 200 lb (90.7 kg)   BMI 30.41 kg/m??     Physical Exam   Constitutional: She is oriented to person, place, and time. She appears well-developed and well-nourished. No distress.   Cardiovascular: Normal rate, regular rhythm and normal heart sounds.   Pulses:       Radial pulses are 2+ on the right side, and 2+ on the left side.   Pulmonary/Chest: Effort normal and breath sounds normal. No respiratory distress. She has no wheezes. She has no rales.   Musculoskeletal:   Gait slow, walking with assistance of cane   Neurological: She is alert and oriented to person, place, and time.    Skin: Skin is warm and dry.   Psychiatric: She has  a normal mood and affect. Her behavior is normal.   Nursing note and vitals reviewed.      ASSESSMENT and PLAN    ICD-10-CM ICD-9-CM    1. COPD with acute exacerbation ([place]) J44.1 491.21    2. Primary osteoarthritis of both hips M16.0 715.15 ketorolac tromethamine (TORADOL) 60 mg/2 mL soln      REFERRAL TO PAIN MANAGEMENT   3. Chronic right hip pain M25.551 719.45 ketorolac tromethamine (TORADOL) 60 mg/2 mL soln    G89.29 338.29 REFERRAL TO PAIN MANAGEMENT   4. Type 2 diabetes mellitus with hyperglycemia, without long-term current use of insulin ([facility]) E11.65 250.00      790.29       COPD stable since hospital discharge. Continue Adviar. Use albuterol prn. She is feeling much better today.   Chronic right sided hip and leg pain- Given toradol shot today in the office. I explained to her that I do not manage chronic pain. I will send referral to pain management and see if we can find someone who will accept her as a patient- kicked out of previous pain management due to marijuana on UDS.   DM- Continue metformin. A1c 9.5 on 08/07/17. Needs to change diet and cut back on carbs and sugar. Will give 3 months to make lifestyle changes and then if no improvement, will add another medication to her regimen.   Reviewed medication and side effects. Patient agrees with the plan and verbalizes understanding.   I have spent over 25 minutes in face to face time with this pt in discussion with respect to the aforementioned problems, diagnoses and management plans. >50% of the time was spent counseling and coordinating care.     Follow-up and Dispositions    ?? Return in about 3 months (around 11/29/2017) for DM, HTN.         Wallene Huh, PA-C  08/29/2017

## 2017-08-29 NOTE — Patient Instructions (Addendum)
COPD Exacerbation Plan: Care Instructions  Your Care Instructions    If you have chronic obstructive pulmonary disease (COPD), your usual shortness of breath could suddenly get worse. You may start coughing more and have more mucus. This flare-up is called a COPD exacerbation (say "[id number]").  A lung infection or air pollution could set off an exacerbation. Sometimes it can happen after a quick change in temperature or being around chemicals.  Work with your doctor to make a plan for dealing with an exacerbation. You can better manage it if you plan ahead.  Follow-up care is a key part of your treatment and safety. Be sure to make and go to all appointments, and call your doctor if you are having problems. It's also a good idea to know your test results and keep a list of the medicines you take.  How can you care for yourself at home?  During an exacerbation  ?? Do not panic if you start to have one. Quick treatment at home may help you prevent serious breathing problems. If you have a COPD exacerbation plan that you developed with your doctor, follow it.  ?? Take your medicines exactly as your doctor tells you.  ? Use your inhaler as directed by your doctor. If your symptoms do not get better after you use your medicine, have someone take you to the emergency room. Call an ambulance if necessary.  ? With inhaled medicines, a spacer or a nebulizer may help you get more medicine to your lungs. Ask your doctor or pharmacist how to use them properly. Practice using the spacer in front of a mirror before you have an exacerbation. This may help you get the medicine into your lungs quickly.  ? If your doctor has given you steroid pills, take them as directed.  ? Your doctor may have given you a prescription for antibiotics, which you can fill if you need it.  ? Talk to your doctor if you have any problems with your medicine. And call your doctor if you have to use your antibiotic or steroid pills.   Preventing an exacerbation  ?? Do not smoke. This is the most important step you can take to prevent more damage to your lungs and prevent problems. If you already smoke, it is never too late to stop. If you need help quitting, talk to your doctor about stop-smoking programs and medicines. These can increase your chances of quitting for good.  ?? Take your daily medicines as prescribed.  ?? Avoid colds and flu.  ? Get a pneumococcal vaccine.  ? Get a flu vaccine each year, as soon as it is available. Ask those you live or work with to do the same, so they will not get the flu and infect you.  ? Try to stay away from people with colds or the flu.  ? Wash your hands often.  ?? Avoid secondhand smoke; air pollution; cold, dry air; hot, humid air; and high altitudes. Stay at home with your windows closed when air pollution is bad.  ?? Learn breathing techniques for COPD, such as breathing through pursed lips. These techniques can help you breathe easier during an exacerbation.  When should you call for help?  Call 911 anytime you think you may need emergency care. For example, call if:  ?? ?? You have severe trouble breathing.   ?? ?? You have severe chest pain.   ??Call your doctor now or seek immediate medical care if:  ?? ?? You   have new or worse shortness of breath.   ?? ?? You develop new chest pain.   ?? ?? You are coughing more deeply or more often, especially if you notice more mucus or a change in the color of your mucus.   ?? ?? You cough up blood.   ?? ?? You have new or increased swelling in your legs or belly.   ?? ?? You have a fever.   ??Watch closely for changes in your health, and be sure to contact your doctor if:  ?? ?? You need to use your antibiotic or steroid pills.   ?? ?? Your symptoms are getting worse.   Where can you learn more?  Go to http://www.healthwise.net/[employer].  Enter U536 in the search box to learn more about "COPD Exacerbation Plan: Care Instructions."  Current as of: February 15, 2017   Content Version: 11.9  ?? 2006-2018 [employer]. Care instructions adapted under license by [employer] (which disclaims liability or warranty for this information). If you have questions about a medical condition or this instruction, always ask your healthcare professional. [employer] disclaims any warranty or liability for your use of this information.

## 2017-08-31 NOTE — Telephone Encounter (Signed)
Patient called requesting to speak to [facility] about a pain shot or some other medications. Reason for call was very unclear.

## 2017-08-31 NOTE — Telephone Encounter (Signed)
Spoke with patient who states that she thought she was going to get a pain medication RX after her appointment. She received an Toradol injection at her appointment but that has worn off. Please advise.

## 2017-09-03 ENCOUNTER — Encounter

## 2017-09-04 NOTE — Telephone Encounter (Signed)
She was advised that I do not manage chronic pain and unfortunately Dr. Jac Canavanharakan was doing this for her. I have sent in another order to pain management that [facility] is working on and hopefully will find someone who will take her.

## 2017-09-05 NOTE — Telephone Encounter (Signed)
Left message for patient to return call.

## 2017-09-08 ENCOUNTER — Inpatient Hospital Stay

## 2017-09-08 ENCOUNTER — Inpatient Hospital Stay
Admit: 2017-09-08 | Discharge: 2017-09-14 | Disposition: A | Discharge status: Home Health | Payer: MEDICARE | Attending: Internal Medicine | Admitting: Internal Medicine

## 2017-09-08 ENCOUNTER — Inpatient Hospital Stay: Admit: 2017-09-08 | Payer: MEDICARE | Primary: Physician Assistant

## 2017-09-08 ENCOUNTER — Emergency Department: Admit: 2017-09-08 | Payer: MEDICARE | Primary: Physician Assistant

## 2017-09-08 DIAGNOSIS — A419 Sepsis, unspecified organism: Secondary | ICD-10-CM

## 2017-09-08 LAB — EKG 12-LEAD
Atrial Rate: 103 {beats}/min
P Axis: 79 degrees
P-R Interval: 202 ms
Q-T Interval: 340 ms
QRS Duration: 94 ms
QTc Calculation (Bazett): 445 ms
R Axis: 8 degrees
T Axis: 77 degrees
Ventricular Rate: 103 {beats}/min

## 2017-09-08 LAB — HEPATITIS B SURFACE ANTIBODY: Hep B S Ab: NONREACTIVE

## 2017-09-08 LAB — BLOOD GAS, ARTERIAL
ALLENS TEST: POSITIVE
ALLENS TEST: POSITIVE
BASE EXCESS: -16 mmol/L — ABNORMAL LOW (ref <=3)
BASE EXCESS: -18 mmol/L — ABNORMAL LOW (ref <=3)
BICARBONATE: 13.7 mmol/L — CL (ref 18.0–26.0)
BICARBONATE: 12.2 mmol/L — CL (ref 18.0–26.0)
CO2, TOTAL: 15.2 mmol/L — ABNORMAL LOW (ref 24–29)
CO2, TOTAL: 13.7 mmol/L — CL (ref 24–29)
FIO2: 55
O2 SAT: 93 % (ref 90–100)
O2 SAT: 97.4 % (ref 90–100)
PCO2: 46.5 mm Hg — ABNORMAL HIGH (ref 35.0–45.0)
PCO2: 48.1 mm Hg — ABNORMAL HIGH (ref 35.0–45.0)
PEEP/CPAP: 5
PO2: 89.1 mm Hg (ref 75–100)
PO2: 139.7 mm Hg — ABNORMAL HIGH (ref 75–100)
Patient temp.: 96.8
Patient temp.: 97.8
Performed by: 96566
Performed by: 96566
Respiratory Rate: 12
SITE: 65
T VTE: 534
Total resp. rate: 12
pH: 7.07 — CL (ref 7.350–7.450)
pH: 7.009 — CL (ref 7.350–7.450)

## 2017-09-08 LAB — AMMONIA: Ammonia, plasma: 31 umol/L (ref 25–32)

## 2017-09-08 LAB — GLUCOSE, POC
Glucose (POC): 350 mg/dL — ABNORMAL HIGH (ref 65–105)
Glucose (POC): 372 mg/dL — ABNORMAL HIGH (ref 65–105)
Glucose (POC): 300 mg/dL — ABNORMAL HIGH (ref 65–105)

## 2017-09-08 LAB — OSMOLALITY, SERUM/PLASMA: Osmolality, serum/plasma: 320 mOsm/kg — ABNORMAL HIGH (ref 280–301)

## 2017-09-08 LAB — PROTHROMBIN TIME + INR
INR: 1.1 (ref 0.1–1.1)
Prothrombin time: 13.2 seconds — ABNORMAL HIGH (ref 10.2–12.9)

## 2017-09-08 LAB — POC URINE MACROSCOPIC
Bilirubin: NEGATIVE
Glucose: 100 mg/dl — AB
Ketone: NEGATIVE mg/dl
Nitrites: NEGATIVE
Protein: 100 mg/dl — AB
Specific gravity: 1.02 (ref 1.005–1.030)
Urobilinogen: 0.2 EU/dl (ref 0.0–1.0)
pH (UA): 5.5 (ref 5–9)

## 2017-09-08 LAB — METABOLIC PANEL, BASIC
Anion gap: 19 mmol/L — ABNORMAL HIGH (ref 5–15)
Anion gap: 16 mmol/L — ABNORMAL HIGH (ref 5–15)
Anion gap: 17 mmol/L — ABNORMAL HIGH (ref 5–15)
BUN: 67 mg/dl — ABNORMAL HIGH (ref 7–25)
BUN: 63 mg/dl — ABNORMAL HIGH (ref 7–25)
BUN: 65 mg/dl — ABNORMAL HIGH (ref 7–25)
CO2: 15 mEq/L — ABNORMAL LOW (ref 21–32)
CO2: 14 mEq/L — CL (ref 21–32)
CO2: 13 mEq/L — CL (ref 21–32)
Calcium: 8 mg/dl — ABNORMAL LOW (ref 8.5–10.1)
Calcium: 7.3 mg/dl — ABNORMAL LOW (ref 8.5–10.1)
Calcium: 7.5 mg/dl — ABNORMAL LOW (ref 8.5–10.1)
Chloride: 92 mEq/L — ABNORMAL LOW (ref 98–107)
Chloride: 100 mEq/L (ref 98–107)
Chloride: 100 mEq/L (ref 98–107)
Creatinine: 7.6 mg/dl — ABNORMAL HIGH (ref 0.6–1.3)
Creatinine: 7.6 mg/dl — ABNORMAL HIGH (ref 0.6–1.3)
Creatinine: 7.2 mg/dl — ABNORMAL HIGH (ref 0.6–1.3)
GFR est AA: 7
GFR est AA: 7
GFR est AA: 7
GFR est non-AA: 6
GFR est non-AA: 6
GFR est non-AA: 6
Glucose: 291 mg/dl — ABNORMAL HIGH (ref 74–106)
Glucose: 362 mg/dl — ABNORMAL HIGH (ref 74–106)
Glucose: 427 mg/dl — CR (ref 74–106)
Potassium: 8 mEq/L — CR (ref 3.5–5.1)
Potassium: 7.8 mEq/L — CR (ref 3.5–5.1)
Potassium: 7.9 mEq/L — CR (ref 3.5–5.1)
Sodium: 127 mEq/L — ABNORMAL LOW (ref 136–145)
Sodium: 131 mEq/L — ABNORMAL LOW (ref 136–145)
Sodium: 130 mEq/L — ABNORMAL LOW (ref 136–145)

## 2017-09-08 LAB — CBC WITH AUTOMATED DIFF
BASOPHILS: 0.1 % (ref 0–3)
EOSINOPHILS: 0 % (ref 0–5)
HCT: 32.9 % — ABNORMAL LOW (ref 37.0–50.0)
HGB: 10.8 gm/dl — ABNORMAL LOW (ref 13.0–17.2)
IMMATURE GRANULOCYTES: 0.6 % (ref 0.0–3.0)
LYMPHOCYTES: 4.6 % — ABNORMAL LOW (ref 28–48)
MCH: 29.4 pg (ref 25.4–34.6)
MCHC: 32.8 gm/dl (ref 30.0–36.0)
MCV: 89.6 fL (ref 80.0–98.0)
MONOCYTES: 2.6 % (ref 1–13)
MPV: 11 fL — ABNORMAL HIGH (ref 6.0–10.0)
NEUTROPHILS: 92.1 % — ABNORMAL HIGH (ref 34–64)
NRBC: 0 (ref 0–0)
PLATELET: 212 10*3/uL (ref 140–450)
RBC: 3.67 M/uL (ref 3.60–5.20)
RDW-SD: 44 (ref 36.4–46.3)
WBC: 15.1 10*3/uL — ABNORMAL HIGH (ref 4.0–11.0)

## 2017-09-08 LAB — DRUG SCREEN, URINE
Amphetamine: NEGATIVE
Barbiturates: NEGATIVE
Benzodiazepines: NEGATIVE
Cocaine: NEGATIVE
Marijuana: NEGATIVE
Methadone: NEGATIVE
Opiates: NEGATIVE
Phencyclidine: NEGATIVE

## 2017-09-08 LAB — SALICYLATE: Salicylate: 1.8 mg/dl — ABNORMAL LOW (ref 2.8–20.0)

## 2017-09-08 LAB — EKG, 12 LEAD, INITIAL
Atrial Rate: 103 {beats}/min
Calculated P Axis: 79 degrees
Calculated R Axis: 8 degrees
Calculated T Axis: 77 degrees
P-R Interval: 202 ms
Q-T Interval: 340 ms
QRS Duration: 94 ms
QTC Calculation (Bezet): 445 ms
Ventricular Rate: 103 {beats}/min

## 2017-09-08 LAB — POC CHEM8
BUN: 77 mg/dl — ABNORMAL HIGH (ref 7–25)
BUN: 62 mg/dl — ABNORMAL HIGH (ref 7–25)
CALCIUM,IONIZED: 4.2 mg/dL — ABNORMAL LOW (ref 4.40–5.40)
CALCIUM,IONIZED: 3.8 mg/dL — ABNORMAL LOW (ref 4.40–5.40)
CO2, TOTAL: 20 mmol/L — ABNORMAL LOW (ref 21–32)
CO2, TOTAL: 13 mmol/L — CL (ref 21–32)
Chloride: 96 mEq/L — ABNORMAL LOW (ref 98–107)
Chloride: 103 mEq/L (ref 98–107)
Creatinine: 7.6 mg/dl — ABNORMAL HIGH (ref 0.6–1.3)
Creatinine: 7.4 mg/dl — ABNORMAL HIGH (ref 0.6–1.3)
Glucose: 288 mg/dL — ABNORMAL HIGH (ref 74–106)
Glucose: 350 mg/dL — ABNORMAL HIGH (ref 74–106)
HCT: 35 % — ABNORMAL LOW (ref 38–45)
HCT: 32 % — ABNORMAL LOW (ref 38–45)
HGB: 11.9 gm/dl — ABNORMAL LOW (ref 12.4–17.2)
HGB: 10.9 gm/dl — ABNORMAL LOW (ref 12.4–17.2)
Potassium: 8.6 mEq/L — CR (ref 3.5–4.9)
Potassium: 7.8 mEq/L — CR (ref 3.5–4.9)
Sodium: 126 mEq/L — ABNORMAL LOW (ref 136–145)
Sodium: 129 mEq/L — ABNORMAL LOW (ref 136–145)

## 2017-09-08 LAB — CKMB PROFILE
CK - MB: 0.7 ng/ml (ref 0.0–3.6)
CK-MB Index: 0.4 % (ref 0.0–4.9)
CK: 165 U/L (ref 26–192)

## 2017-09-08 LAB — LIPASE: Lipase: 88 U/L (ref 73–393)

## 2017-09-08 LAB — LACTIC ACID
Lactic Acid: 10.6 mmol/L — CR (ref 0.4–2.0)
Lactic Acid: 11 mmol/L — CR (ref 0.4–2.0)
Lactic Acid: 6.2 mmol/L — CR (ref 0.4–2.0)

## 2017-09-08 LAB — TSH 3RD GENERATION: TSH: 0.506 u[IU]/mL (ref 0.358–3.740)

## 2017-09-08 LAB — ETHYL ALCOHOL: ALCOHOL(ETHYL),SERUM: NEGATIVE mg/dl (ref 0.0–9.0)

## 2017-09-08 LAB — HEPATIC FUNCTION PANEL
ALT (SGPT): 16 U/L (ref 12–78)
AST (SGOT): 12 U/L — ABNORMAL LOW (ref 15–37)
Albumin: 3.1 gm/dl — ABNORMAL LOW (ref 3.4–5.0)
Alk. phosphatase: 69 U/L (ref 45–117)
Bilirubin, direct: 0.1 mg/dl (ref 0.0–0.2)
Bilirubin, total: 0.3 mg/dl (ref 0.2–1.0)
Protein, total: 6.8 gm/dl (ref 6.4–8.2)

## 2017-09-08 LAB — S.PNEUMO AG, UR/CSF: Strep pneumo Ag, urine: NEGATIVE

## 2017-09-08 LAB — INFLUENZA A/B, BY PCR
Influenza A PCR: NEGATIVE
Influenza B PCR: NEGATIVE

## 2017-09-08 LAB — LEGIONELLA PNEUMOPHILA AG, URINE: Legionella Ag, urine: NEGATIVE

## 2017-09-08 LAB — NT-PRO BNP: NT pro-BNP: 2810 pg/ml — ABNORMAL HIGH (ref 0.0–125.0)

## 2017-09-08 LAB — PROCALCITONIN: PROCALCITONIN: 0.38 ng/ml (ref 0.00–0.50)

## 2017-09-08 LAB — POC URINE MICROSCOPIC

## 2017-09-08 LAB — HEP B SURFACE AG: Hepatitis B surface Ag: NONREACTIVE

## 2017-09-08 LAB — POTASSIUM
Potassium: 6.4 mEq/L — CR (ref 3.5–5.1)
Potassium: 7.9 mEq/L — CR (ref 3.5–5.1)

## 2017-09-08 LAB — HEP B SURFACE AB: Hepatitis B surface Ab: NONREACTIVE

## 2017-09-08 LAB — ACETAMINOPHEN: Acetaminophen level: <2 ug/mL — ABNORMAL LOW (ref 10.0–30.0)

## 2017-09-08 LAB — MAGNESIUM: Magnesium: 2.4 mg/dl (ref 1.6–2.6)

## 2017-09-08 LAB — TROPONIN I: Troponin-I: <0.015 ng/ml (ref 0.000–0.045)

## 2017-09-08 MED ORDER — IPRATROPIUM-ALBUTEROL 2.5 MG-0.5 MG/3 ML NEB SOLUTION
2.5 mg-0.5 mg/3 ml | RESPIRATORY_TRACT | Status: DC | PRN
Start: 2017-09-08 — End: 2017-09-14

## 2017-09-08 MED ORDER — SODIUM CHLORIDE 0.9 % IJ SYRG
Freq: Once | INTRAMUSCULAR | Status: AC
Start: 2017-09-08 — End: 2017-09-08
  Administered 2017-09-08: 15:00:00 via INTRAVENOUS

## 2017-09-08 MED ORDER — NALOXONE 1 MG/ML IJ SYRG(AKA NARCAN)
1 mg/mL | INTRAMUSCULAR | Status: DC
Start: 2017-09-08 — End: 2017-09-08
  Administered 2017-09-08: 13:00:00 via INTRAVENOUS

## 2017-09-08 MED ORDER — DEXTROSE 50% IN WATER (D50W) IV SYRG
INTRAVENOUS | Status: AC
Start: 2017-09-08 — End: 2017-09-09
  Administered 2017-09-08: 20:00:00 via INTRAVENOUS

## 2017-09-08 MED ORDER — PIPERACILLIN-TAZOBACTAM 3.375 GRAM IV SOLR
3.375 gram | Freq: Two times a day (BID) | INTRAVENOUS | Status: DC
Start: 2017-09-08 — End: 2017-09-08
  Administered 2017-09-08: 17:00:00 via INTRAVENOUS

## 2017-09-08 MED ORDER — PROPOFOL 10 MG/ML IV EMUL
10 mg/mL | INTRAVENOUS | Status: AC
Start: 2017-09-08 — End: 2017-09-08
  Administered 2017-09-08: 20:00:00 via INTRAVENOUS

## 2017-09-08 MED ORDER — SODIUM CHLORIDE 0.9 % IJ SYRG
INTRAMUSCULAR | Status: DC | PRN
Start: 2017-09-08 — End: 2017-09-14

## 2017-09-08 MED ORDER — WHITE PETROLATUM-MINERAL OIL 85 %-15 % EYE OINTMENT
85-15 % | Freq: Every day | OPHTHALMIC | Status: DC
Start: 2017-09-08 — End: 2017-09-14
  Administered 2017-09-09 – 2017-09-14 (×5): via OPHTHALMIC

## 2017-09-08 MED ORDER — MONTELUKAST 10 MG TAB
10 mg | Freq: Every evening | ORAL | Status: DC
Start: 2017-09-08 — End: 2017-09-14
  Administered 2017-09-09 – 2017-09-14 (×6): via ORAL

## 2017-09-08 MED ORDER — INSULIN LISPRO 100 UNIT/ML INJECTION
100 unit/mL | Freq: Four times a day (QID) | SUBCUTANEOUS | Status: DC
Start: 2017-09-08 — End: 2017-09-08

## 2017-09-08 MED ORDER — NALOXONE 0.4 MG/ML INJECTION
0.4 mg/mL | INTRAMUSCULAR | Status: DC | PRN
Start: 2017-09-08 — End: 2017-09-14

## 2017-09-08 MED ORDER — ACETAMINOPHEN 650 MG RECTAL SUPPOSITORY
650 mg | RECTAL | Status: DC | PRN
Start: 2017-09-08 — End: 2017-09-14

## 2017-09-08 MED ORDER — FENTANYL CITRATE (PF) 50 MCG/ML IJ SOLN
50 mcg/mL | INTRAMUSCULAR | Status: DC | PRN
Start: 2017-09-08 — End: 2017-09-13

## 2017-09-08 MED ORDER — HEPARIN, PORCINE (PF) 10 UNIT/ML IV SYRINGE
10 unit/mL | INTRAVENOUS | Status: DC | PRN
Start: 2017-09-08 — End: 2017-09-14

## 2017-09-08 MED ORDER — DEXTROSE 50% IN WATER (D50W) IV
INTRAVENOUS | Status: DC | PRN
Start: 2017-09-08 — End: 2017-09-09

## 2017-09-08 MED ORDER — SODIUM POLYSTYRENE SULFONATE ORAL POWDER
ORAL | Status: AC
Start: 2017-09-08 — End: 2017-09-08
  Administered 2017-09-08: 20:00:00 via NASOGASTRIC

## 2017-09-08 MED ORDER — CALCIUM GLUCONATE 100 MG/ML (10%) IV SOLN
100 mg/mL (10%) | INTRAVENOUS | Status: AC
Start: 2017-09-08 — End: 2017-09-09
  Administered 2017-09-08: 20:00:00

## 2017-09-08 MED ORDER — CALCIUM GLUCONATE 100 MG/ML (10%) IV SOLN
10010 mg/mL (10%) | Freq: Once | INTRAVENOUS | Status: AC
Start: 2017-09-08 — End: 2017-09-09
  Administered 2017-09-08: 20:00:00 via INTRAVENOUS

## 2017-09-08 MED ORDER — PHARMACY VANCOMYCIN NOTE
Status: DC
Start: 2017-09-08 — End: 2017-09-09

## 2017-09-08 MED ORDER — DEXTROSE 50% IN WATER (D50W) IV
Freq: Once | INTRAVENOUS | Status: AC
Start: 2017-09-08 — End: 2017-09-08
  Administered 2017-09-08: 19:00:00 via INTRAVENOUS

## 2017-09-08 MED ORDER — SODIUM CHLORIDE 0.9 % IV
10 gram | Freq: Once | INTRAVENOUS | Status: AC
Start: 2017-09-08 — End: 2017-09-08
  Administered 2017-09-09: 01:00:00 via INTRAVENOUS

## 2017-09-08 MED ORDER — GLUCAGON 1 MG INJECTION
1 mg | INTRAMUSCULAR | Status: DC | PRN
Start: 2017-09-08 — End: 2017-09-09

## 2017-09-08 MED ORDER — INSULIN LISPRO 100 UNIT/ML INJECTION
100 unit/mL | Freq: Four times a day (QID) | SUBCUTANEOUS | Status: DC
Start: 2017-09-08 — End: 2017-09-09
  Administered 2017-09-08 – 2017-09-09 (×4): via SUBCUTANEOUS

## 2017-09-08 MED ORDER — VECURONIUM BROMIDE 10 MG IV SOLR
10 mg | INTRAVENOUS | Status: AC
Start: 2017-09-08 — End: 2017-09-08
  Administered 2017-09-08: 17:00:00 via INTRAVENOUS

## 2017-09-08 MED ORDER — NALOXONE 1 MG/ML IJ SYRG(AKA NARCAN)
1 mg/mL | INTRAMUSCULAR | Status: AC
Start: 2017-09-08 — End: 2017-09-08
  Administered 2017-09-08: 13:00:00 via INTRAVENOUS

## 2017-09-08 MED ORDER — SODIUM BICARBONATE 8.4 % (1 MEQ/ML) IV SYRG
8.4 % (1 mEq/mL) | Freq: Once | INTRAVENOUS | Status: AC
Start: 2017-09-08 — End: 2017-09-08
  Administered 2017-09-08: 15:00:00 via INTRAVENOUS

## 2017-09-08 MED ORDER — ALBUTEROL SULFATE 0.083 % (0.83 MG/ML) SOLN FOR INHALATION
2.5 mg /3 mL (0.083 %) | RESPIRATORY_TRACT | Status: AC
Start: 2017-09-08 — End: 2017-09-08
  Administered 2017-09-08 (×3): via RESPIRATORY_TRACT

## 2017-09-08 MED ORDER — DEXTROSE 50% IN WATER (D50W) IV SYRG
INTRAVENOUS | Status: DC | PRN
Start: 2017-09-08 — End: 2017-09-14

## 2017-09-08 MED ORDER — SODIUM CHLORIDE 0.9% BOLUS IV
0.9 % | INTRAVENOUS | Status: AC
Start: 2017-09-08 — End: 2017-09-08
  Administered 2017-09-08: 19:00:00 via INTRAVENOUS

## 2017-09-08 MED ORDER — HEPARIN, PORCINE (PF) 10 UNIT/ML IV SYRINGE
10 unit/mL | Freq: Every day | INTRAVENOUS | Status: DC
Start: 2017-09-08 — End: 2017-09-14
  Administered 2017-09-09 – 2017-09-14 (×6)

## 2017-09-08 MED ORDER — SODIUM CHLORIDE 0.9% BOLUS IV
0.9 % | Freq: Once | INTRAVENOUS | Status: AC
Start: 2017-09-08 — End: 2017-09-08
  Administered 2017-09-08: 19:00:00 via INTRAVENOUS

## 2017-09-08 MED ORDER — IPRATROPIUM-ALBUTEROL 2.5 MG-0.5 MG/3 ML NEB SOLUTION
2.5 mg-0.5 mg/3 ml | Freq: Four times a day (QID) | RESPIRATORY_TRACT | Status: DC
Start: 2017-09-08 — End: 2017-09-14
  Administered 2017-09-08 – 2017-09-14 (×26): via RESPIRATORY_TRACT

## 2017-09-08 MED ORDER — INSULIN REGULAR HUMAN 100 UNIT/ML INJECTION
100 unit/mL | Freq: Once | INTRAMUSCULAR | Status: AC
Start: 2017-09-08 — End: 2017-09-09
  Administered 2017-09-08: 20:00:00 via INTRAVENOUS

## 2017-09-08 MED ORDER — TIOTROPIUM BROMIDE 2.5 MCG/ACTUATION MIST FOR INHALATION
2.5 mcg/actuation | Freq: Every day | RESPIRATORY_TRACT | Status: DC
Start: 2017-09-08 — End: 2017-09-14
  Administered 2017-09-10 – 2017-09-14 (×5): via RESPIRATORY_TRACT

## 2017-09-08 MED ORDER — INSULIN REGULAR HUMAN 100 UNIT/ML INJECTION
100 unit/mL | INTRAMUSCULAR | Status: AC
Start: 2017-09-08 — End: 2017-09-08
  Administered 2017-09-08: 19:00:00 via INTRAVENOUS

## 2017-09-08 MED ORDER — VANCOMYCIN 10 GRAM IV SOLR
10 gram | Freq: Once | INTRAVENOUS | Status: AC
Start: 2017-09-08 — End: 2017-09-08
  Administered 2017-09-08: 15:00:00 via INTRAVENOUS

## 2017-09-08 MED ORDER — DEXTROSE 50% IN WATER (D50W) IV SYRG
Freq: Once | INTRAVENOUS | Status: AC
Start: 2017-09-08 — End: 2017-09-09

## 2017-09-08 MED ORDER — FLUTICASONE-SALMETEROL 100 MCG-50 MCG/DOSE DISK DEVICE FOR INHALATION
100-50 mcg/dose | Freq: Two times a day (BID) | RESPIRATORY_TRACT | Status: DC
Start: 2017-09-08 — End: 2017-09-08

## 2017-09-08 MED ORDER — SODIUM BICARBONATE 8.4 % IV
18.4 mEq/mL (8.4 %) | INTRAVENOUS | Status: DC
Start: 2017-09-08 — End: 2017-09-09
  Administered 2017-09-08 – 2017-09-09 (×4): via INTRAVENOUS

## 2017-09-08 MED ORDER — ALBUTEROL SULFATE 0.083 % (0.83 MG/ML) SOLN FOR INHALATION
2.5 mg /3 mL (0.083 %) | RESPIRATORY_TRACT | Status: DC | PRN
Start: 2017-09-08 — End: 2017-09-08

## 2017-09-08 MED ORDER — INSULIN GLARGINE 100 UNIT/ML INJECTION
100 unit/mL | Freq: Every evening | SUBCUTANEOUS | Status: DC
Start: 2017-09-08 — End: 2017-09-14
  Administered 2017-09-09 – 2017-09-14 (×6): via SUBCUTANEOUS

## 2017-09-08 MED ORDER — PROPOFOL INFUSION
10 mg/mL | INTRAVENOUS | Status: DC
Start: 2017-09-08 — End: 2017-09-10
  Administered 2017-09-08 – 2017-09-09 (×11): via INTRAVENOUS

## 2017-09-08 MED ORDER — PIPERACILLIN-TAZOBACTAM 3.375 GRAM IV SOLR
3.375 gram | Freq: Two times a day (BID) | INTRAVENOUS | Status: DC
Start: 2017-09-08 — End: 2017-09-10
  Administered 2017-09-09 – 2017-09-10 (×5): via INTRAVENOUS

## 2017-09-08 MED ORDER — ETOMIDATE 2 MG/ML IV SOLN
2 mg/mL | INTRAVENOUS | Status: AC
Start: 2017-09-08 — End: 2017-09-08
  Administered 2017-09-08: 17:00:00 via INTRAVENOUS

## 2017-09-08 MED ORDER — ACETAMINOPHEN (TYLENOL) SOLUTION 32MG/ML
ORAL | Status: DC | PRN
Start: 2017-09-08 — End: 2017-09-14

## 2017-09-08 MED ORDER — SODIUM POLYSTYRENE SULFONATE ORAL POWDER
ORAL | Status: AC
Start: 2017-09-08 — End: 2017-09-08
  Administered 2017-09-08: 19:00:00 via NASOGASTRIC

## 2017-09-08 MED ORDER — SODIUM CHLORIDE 0.9 % INJECTION
40 mg | Freq: Every day | INTRAMUSCULAR | Status: DC
Start: 2017-09-08 — End: 2017-09-11
  Administered 2017-09-09 – 2017-09-11 (×3): via INTRAVENOUS

## 2017-09-08 MED ORDER — ROSUVASTATIN 10 MG TAB
10 mg | Freq: Every evening | ORAL | Status: DC
Start: 2017-09-08 — End: 2017-09-14
  Administered 2017-09-09 – 2017-09-14 (×6): via ORAL

## 2017-09-08 MED ORDER — MIDAZOLAM 1 MG/ML IN NS IV INFUSION
1 mg/mL | INTRAVENOUS | Status: DC
Start: 2017-09-08 — End: 2017-09-10
  Administered 2017-09-08: 17:00:00 via INTRAVENOUS

## 2017-09-08 MED ORDER — HEPARIN (PORCINE) 5,000 UNIT/ML IJ SOLN
5000 unit/mL | Freq: Three times a day (TID) | INTRAMUSCULAR | Status: DC
Start: 2017-09-08 — End: 2017-09-14
  Administered 2017-09-08 – 2017-09-14 (×17): via SUBCUTANEOUS

## 2017-09-08 MED ORDER — INSULIN REGULAR HUMAN 100 UNIT/ML INJECTION
100 unit/mL | Freq: Once | INTRAMUSCULAR | Status: AC
Start: 2017-09-08 — End: 2017-09-08
  Administered 2017-09-08: 16:00:00 via INTRAVENOUS

## 2017-09-08 MED ORDER — PIPERACILLIN-TAZOBACTAM 4.5 GRAM IV SOLR
4.5 gram | Freq: Once | INTRAVENOUS | Status: AC
Start: 2017-09-08 — End: 2017-09-08
  Administered 2017-09-08: 15:00:00 via INTRAVENOUS

## 2017-09-08 MED ORDER — NOREPINEPHRINE BITARTRATE 4 MG/250 ML (16 MCG/ML) IN DEXTROSE 5 % IV
4 mg/250 mL (16 mcg/mL) | INTRAVENOUS | Status: DC
Start: 2017-09-08 — End: 2017-09-08
  Administered 2017-09-08: 17:00:00 via INTRAVENOUS

## 2017-09-08 MED ORDER — DEXTROSE 50% IN WATER (D50W) IV SYRG
INTRAVENOUS | Status: AC
Start: 2017-09-08 — End: 2017-09-08
  Administered 2017-09-08: 16:00:00 via INTRAVENOUS

## 2017-09-08 MED ORDER — FENTANYL CITRATE (PF) 50 MCG/ML IJ SOLN
50 mcg/mL | INTRAMUSCULAR | Status: AC | PRN
Start: 2017-09-08 — End: 2017-09-08

## 2017-09-08 MED ORDER — SODIUM CHLORIDE 0.9 % IJ SYRG
Freq: Every day | INTRAMUSCULAR | Status: DC
Start: 2017-09-08 — End: 2017-09-14
  Administered 2017-09-09 – 2017-09-14 (×5)

## 2017-09-08 MED ORDER — INSULIN LISPRO 100 UNIT/ML INJECTION
100 unit/mL | SUBCUTANEOUS | Status: DC | PRN
Start: 2017-09-08 — End: 2017-09-08

## 2017-09-08 MED ORDER — SODIUM CHLORIDE 0.9% BOLUS IV
0.9 % | INTRAVENOUS | Status: AC
Start: 2017-09-08 — End: 2017-09-08
  Administered 2017-09-08: 15:00:00 via INTRAVENOUS

## 2017-09-08 MED ORDER — FUROSEMIDE 10 MG/ML IJ SOLN
10 mg/mL | Freq: Once | INTRAMUSCULAR | Status: AC
Start: 2017-09-08 — End: 2017-09-08
  Administered 2017-09-08: 15:00:00 via INTRAVENOUS

## 2017-09-08 MED ORDER — ACETAMINOPHEN 325 MG TABLET
325 mg | ORAL | Status: DC | PRN
Start: 2017-09-08 — End: 2017-09-14

## 2017-09-08 MED ORDER — INSULIN GLARGINE 100 UNIT/ML INJECTION
100 unit/mL | Freq: Every evening | SUBCUTANEOUS | Status: DC
Start: 2017-09-08 — End: 2017-09-08

## 2017-09-08 MED ORDER — CALCIUM GLUCONATE 100 MG/ML (10%) IV SOLN
100 mg/mL (10%) | INTRAVENOUS | Status: AC
Start: 2017-09-08 — End: 2017-09-08
  Administered 2017-09-08: 15:00:00 via INTRAVENOUS

## 2017-09-08 MED ORDER — NOREPINEPHRINE BITARTRATE 8 MG/250 ML (32 MCG/ML) IN 0.9 % NACL IV
8 mg/250 mL (32 mcg/mL) | INTRAVENOUS | Status: DC
Start: 2017-09-08 — End: 2017-09-10
  Administered 2017-09-08 – 2017-09-09 (×9): via INTRAVENOUS

## 2017-09-08 MED ORDER — SODIUM CHLORIDE 0.9% BOLUS IV
0.9 % | INTRAVENOUS | Status: AC
Start: 2017-09-08 — End: 2017-09-08
  Administered 2017-09-08 (×2): via INTRAVENOUS

## 2017-09-08 MED ORDER — ONDANSETRON (PF) 4 MG/2 ML INJECTION
4 mg/2 mL | INTRAMUSCULAR | Status: DC | PRN
Start: 2017-09-08 — End: 2017-09-14

## 2017-09-08 MED ORDER — HEPARIN (PORCINE) 1,000 UNIT/ML IJ SOLN
1000 unit/mL | Freq: Once | INTRAMUSCULAR | Status: AC
Start: 2017-09-08 — End: 2017-09-08
  Administered 2017-09-09: via ARTERIOVENOUS_FISTULA

## 2017-09-08 MED ORDER — CALCIUM GLUCONATE 100 MG/ML (10%) IV SOLN
100 mg/mL (10%) | INTRAVENOUS | Status: AC
Start: 2017-09-08 — End: 2017-09-08
  Administered 2017-09-08: 19:00:00 via INTRAVENOUS

## 2017-09-08 MED ORDER — FLUTICASONE 100 MCG-VILANTEROL 25 MCG/DOSE BREATH ACTIVATED INHALER
100-25 mcg/dose | Freq: Every day | RESPIRATORY_TRACT | Status: DC
Start: 2017-09-08 — End: 2017-09-09

## 2017-09-08 MED ORDER — NALOXONE 1 MG/ML IJ SYRG(AKA NARCAN)
1 mg/mL | INTRAMUSCULAR | Status: AC
Start: 2017-09-08 — End: 2017-09-08

## 2017-09-08 MED ORDER — IPRATROPIUM-ALBUTEROL 2.5 MG-0.5 MG/3 ML NEB SOLUTION
2.5 mg-0.5 mg/3 ml | Freq: Four times a day (QID) | RESPIRATORY_TRACT | Status: DC | PRN
Start: 2017-09-08 — End: 2017-09-08
  Administered 2017-09-08: 17:00:00 via RESPIRATORY_TRACT

## 2017-09-08 MED FILL — ALBUTEROL SULFATE 0.083 % (0.83 MG/ML) SOLN FOR INHALATION: 2.5 mg /3 mL (0.083 %) | RESPIRATORY_TRACT | Qty: 1

## 2017-09-08 MED FILL — NOREPINEPHRINE BITARTRATE 4 MG/250 ML (16 MCG/ML) IN DEXTROSE 5 % IV: 4 mg/250 mL (16 mcg/mL) | INTRAVENOUS | Qty: 250

## 2017-09-08 MED FILL — PURALUBE 85 %-15 % EYE OINTMENT: 85-15 % | OPHTHALMIC | Qty: 1

## 2017-09-08 MED FILL — FUROSEMIDE 10 MG/ML IJ SOLN: 10 mg/mL | INTRAMUSCULAR | Qty: 8

## 2017-09-08 MED FILL — DEXTROSE 50% IN WATER (D50W) IV SYRG: INTRAVENOUS | Qty: 50

## 2017-09-08 MED FILL — PIPERACILLIN-TAZOBACTAM 4.5 GRAM IV SOLR: 4.5 gram | INTRAVENOUS | Qty: 4.5

## 2017-09-08 MED FILL — SODIUM CHLORIDE 0.9 % IV: INTRAVENOUS | Qty: 1000

## 2017-09-08 MED FILL — NOREPINEPHRINE BITARTRATE 8 MG/250 ML (32 MCG/ML) IN 0.9 % NACL IV: 8 mg/250 mL (32 mcg/mL) | INTRAVENOUS | Qty: 250

## 2017-09-08 MED FILL — SODIUM POLYSTYRENE SULFONATE ORAL POWDER: ORAL | Qty: 30

## 2017-09-08 MED FILL — NALOXONE 1 MG/ML IJ SYRG(AKA NARCAN): 1 mg/mL | INTRAMUSCULAR | Qty: 2

## 2017-09-08 MED FILL — SODIUM CHLORIDE 0.9 % IV: INTRAVENOUS | Qty: 700

## 2017-09-08 MED FILL — PHARMACY VANCOMYCIN NOTE: Qty: 1

## 2017-09-08 MED FILL — CALCIUM GLUCONATE 100 MG/ML (10%) IV SOLN: 100 mg/mL (10%) | INTRAVENOUS | Qty: 20

## 2017-09-08 MED FILL — VANCOMYCIN 10 GRAM IV SOLR: 10 gram | INTRAVENOUS | Qty: 1750

## 2017-09-08 MED FILL — CALCIUM GLUCONATE 100 MG/ML (10%) IV SOLN: 100 mg/mL (10%) | INTRAVENOUS | Qty: 10

## 2017-09-08 MED FILL — INSULIN LISPRO 100 UNIT/ML INJECTION: 100 unit/mL | SUBCUTANEOUS | Qty: 1

## 2017-09-08 MED FILL — DIPRIVAN 10 MG/ML INTRAVENOUS EMULSION: 10 mg/mL | INTRAVENOUS | Qty: 100

## 2017-09-08 MED FILL — SPIRIVA RESPIMAT 2.5 MCG/ACTUATION SOLUTION FOR INHALATION: 2.5 mcg/actuation | RESPIRATORY_TRACT | Qty: 4

## 2017-09-08 MED FILL — VANCOMYCIN 750 MG IV SOLUTION: 750 mg | INTRAVENOUS | Qty: 750

## 2017-09-08 MED FILL — SODIUM BICARBONATE 8.4 % (1 MEQ/ML) IV SYRG: 8.4 % (1 mEq/mL) | INTRAVENOUS | Qty: 50

## 2017-09-08 MED FILL — BREO ELLIPTA 100 MCG-25 MCG/DOSE POWDER FOR INHALATION: 100-25 mcg/dose | RESPIRATORY_TRACT | Qty: 28

## 2017-09-08 MED FILL — HEPARIN (PORCINE) 1,000 UNIT/ML IJ SOLN: 1000 unit/mL | INTRAMUSCULAR | Qty: 10

## 2017-09-08 MED FILL — IPRATROPIUM-ALBUTEROL 2.5 MG-0.5 MG/3 ML NEB SOLUTION: 2.5 mg-0.5 mg/3 ml | RESPIRATORY_TRACT | Qty: 3

## 2017-09-08 MED FILL — DEXTROSE 5% IN WATER (D5W) IV: INTRAVENOUS | Qty: 850

## 2017-09-08 MED FILL — DEXTROSE 5% IN WATER (D5W) IV: INTRAVENOUS | Qty: 1000

## 2017-09-08 MED FILL — HEPARIN (PORCINE) 5,000 UNIT/ML IJ SOLN: 5000 unit/mL | INTRAMUSCULAR | Qty: 1

## 2017-09-08 MED FILL — INSULIN REGULAR HUMAN 100 UNIT/ML INJECTION: 100 unit/mL | INTRAMUSCULAR | Qty: 1

## 2017-09-08 NOTE — ED Provider Notes (Signed)
[facility]  Emergency Department Treatment Report        Patient: [name] [name] Age: 67 y.o. Sex: female    Date of Birth: 24-Jul-1950 Admit Date: 09/08/2017 PCP: Ernst Breach   MRN: [id number]  CSN: [id number]  Attending:  Serita Sheller, M.D.   Room: PC14/PC14 Time Dictated: 9:07 AM APP:  None     I hereby certify this patient for admission based upon medical necessity as ??  noted below:      Chief Complaint   Unresponsive    History of Present Illness   This is a 67 y.o. female who was found unresponsive this morning by her husband.  She was last seen awake and alert last evening.  Her husband contacted EMS.  On their arrival he told them that he was concerned that she might of taken too much pain medication.  She is on hydrocodone for chronic back pain.  EMS found her to be unresponsive with pinpoint pupils.  They gave 2 mg of nasal Narcan with little response.  The second 2 mg of nasal Narcan because the patient to "wake up a little bit" she did admit to EMS that she had taken hydrocodone but was unable to quantify how much.  EMS found her to be hypoxic on arrival at 70-71% on room air.  Her lungs sounded "junky" so they gave her albuterol nebulizers and Solu-Medrol.  Prehospital Accu-Chek was greater than 200 so this does not appear to be hypoglycemia.    Review of Systems   Review of Systems   Unable to perform ROS: Patient unresponsive       Past Medical/Surgical History     Past Medical History:   Diagnosis Date   ??? Arthritis    ??? 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 COPD (chronic obstructive pulmonary disease) ([place])    ??? DM2 (diabetes mellitus, type 2) ([place])    ??? GERD (gastroesophageal reflux disease)    ??? Hepatitis C     HEP C   ??? HTN (hypertension)    ??? Lung nodule, multiple     (CT 10/2016) Small left upper lobe and 1.7 x 1.6 cm left lower lobe nodules not significantly changed from 07/23/2016 and 03/22/2016   ??? Marijuana use     ??? PTSD (post-traumatic stress disorder)     lived through the [place] in 1993   ??? Thoracic ascending aortic aneurysm ([place])     4.4cm noted on CT Chest (10/2016)   ??? Thyroid nodule     2.5 cm stable right thyroid nodule (CT 10/2016)     Past Surgical History:   Procedure Laterality Date   ??? HX ENDOSCOPY     ??? HX HIP REPLACEMENT Right 01/29/2015   ??? HX HIP REPLACEMENT     ??? HX HYSTERECTOMY  2010    hysterectomy     Taken from prior history.  Social History     Social History     Socioeconomic History   ??? Marital status: MARRIED     Spouse name: Not on file   ??? Number of children: Not on file   ??? Years of education: Not on file   ??? Highest education level: Not on file   Occupational History   ??? Not on file   Social Needs   ??? Financial resource strain: Not on file   ??? Food insecurity:  Worry: Not on file     Inability: Not on file   ??? Transportation needs:     Medical: Not on file     Non-medical: Not on file   Tobacco Use   ??? Smoking status: Former Smoker   ??? Smokeless tobacco: Never Used   ??? Tobacco comment: reported as quit smoking around 1990s per spouse   Substance and Sexual Activity   ??? Alcohol use: No   ??? Drug use: Yes     Types: Marijuana   ??? Sexual activity: Never   Lifestyle   ??? Physical activity:     Days per week: Not on file     Minutes per session: Not on file   ??? Stress: Not on file   Relationships   ??? Social connections:     Talks on phone: Not on file     Gets together: Not on file     Attends religious service: Not on file     Active member of club or organization: Not on file     Attends meetings of clubs or organizations: Not on file     Relationship status: Not on file   ??? Intimate partner violence:     Fear of current or ex partner: Not on file     Emotionally abused: Not on file     Physically abused: Not on file     Forced sexual activity: Not on file   Other Topics Concern   ??? Not on file   Social History Narrative   ??? Not on file     Taken from prior history   Family History     Family History   Problem Relation Age of Onset   ??? Hypertension Mother    ??? Other Mother         OSA   ??? Cancer Father         suspected leukemia per pt's spouse   ??? Cancer Maternal Aunt    ??? Alcohol abuse Maternal Grandfather      Taken from prior history  Current Medications     Prior to Admission Medications   Prescriptions Last Dose Informant Patient Reported? Taking?   Blood-Glucose Meter monitoring kit   No No   Sig: Check sugars 2 times per day, fasting and 2 hours after dinner   Lancets misc   No No   Sig: Check sugars twice daily   QUEtiapine SR (SEROQUEL XR) 150 mg sr tablet   No No   Sig: TAKE 1 TABLET BY MOUTH NIGHTLY   albuterol (ACCUNEB) 1.25 mg/3 mL nebu   No No   Sig: 3 mL by Nebulization route every six (6) hours as needed.   albuterol (PROVENTIL HFA, VENTOLIN HFA, PROAIR HFA) 90 mcg/actuation inhaler   No No   Sig: Take 2 Puffs by inhalation every six (6) hours for 5 days, THEN 2 Puffs every four (4) hours as needed for Wheezing or Shortness of Breath for up to 30 days.   amLODIPine (NORVASC) 10 mg tablet   No No   Sig: TAKE ONE TABLET BY MOUTH ONCE DAILY   benzonatate (TESSALON PERLES) 100 mg capsule   No No   Sig: Take 1 Cap by mouth three (3) times daily as needed for Cough.   cloNIDine HCl (CATAPRES) 0.3 mg tablet   No No   Sig: TAKE ONE TABLET BY MOUTH TWICE DAILY   fluticasone-salmeterol (ADVAIR DISKUS) 100-50 mcg/dose diskus inhaler   No No  Sig: Take 1 Puff by inhalation two (2) times a day.   glucose blood VI test strips (ASCENSIA AUTODISC VI, ONE TOUCH ULTRA TEST VI) strip   No No   Sig: Check sugars twice daily   losartan (COZAAR) 100 mg tablet   No No   Sig: TAKE 1 TABLET BY MOUTH DAILY FOR HYPERTENSION   magnesium oxide (MAG-OX) 400 mg tablet   No No   Sig: TAKE 1 TABLET BY MOUTH DAILY   metFORMIN ER (GLUCOPHAGE XR) 500 mg tablet   No No   Sig: 2 tabs BID   montelukast (SINGULAIR) 10 mg tablet   No No   Sig: TAKE 1 TABLET BY MOUTH DAILY    nabumetone (RELAFEN) 750 mg tablet   No No   Sig: TAKE 1 TABLET BY MOUTH TWICE DAILY AS NEEDED FOR PAIN. USE SPARINGLY   polyethylene glycol (MIRALAX) 17 gram packet   No No   Sig: Take 1 Packet by mouth daily.   rosuvastatin (CRESTOR) 5 mg tablet   No No   Sig: TAKE 1 TABLET BY MOUTH NIGHTLY   spironolactone (ALDACTONE) 100 mg tablet   No No   Sig: TAKE 1 TABLET BY MOUTH DAILY   tiotropium bromide (SPIRIVA RESPIMAT) 2.5 mcg/actuation inhaler   No No   Sig: Take 1 Puff by inhalation daily.   traZODone (DESYREL) 50 mg tablet   No No   Sig: Take 1 Tab by mouth nightly as needed for Sleep.   triamcinolone (NASACORT) 55 mcg nasal inhaler   No No   Sig: 2 Sprays by Both Nostrils route daily.      Facility-Administered Medications: None        Allergies     Allergies   Allergen Reactions   ??? Chocolate [Cocoa] Sneezing       Physical Exam     Patient Vitals for the past 24 hrs:   Temp Pulse Resp BP SpO2        98 %        98 %        97 %        97 %        98 %        99 %        98 %        99 %        97 %        100 %        100 %        100 %        100 %        99 %        100 %        100 %        100 %   09/08/17 1246 ??? 94 12 92/58 100 %   09/08/17 1232 ??? (!) 102 15 90/54 100 %   09/08/17 1216 ??? (!) 101 15 (!) 87/56 100 %   09/08/17 1201 ??? (!) 102 14 (!) 87/53 100 %   09/08/17 1133 ??? (!) 106 15 90/56 100 %   09/08/17 1116 ??? (!) 105 15 92/53 97 %   09/08/17 1109 ??? 99 ??? 91/49 ???   09/08/17 1107 ??? 100 ??? 91/49 ???   09/08/17 1101 ??? (!) 101 15 91/49 97 %   09/08/17 1046 ??? (!) 101 15 92/56 99 %   09/08/17 1045 96.6 ??F (  35.9 ??C) ??? ??? ??? ???   09/08/17 1033 ??? 100 16 (!) 87/56 97 %   09/08/17 0946 ??? (!) 102 17 91/59 99 %   09/08/17 0931 ??? (!) 103 18 99/68 97 %   09/08/17 0916 ??? 98 17 98/57 98 %   09/08/17 0902 ??? 92 19 104/66 99 %   09/08/17 0844 ??? 99 20 130/64 97 %   09/08/17 0842 ??? ??? ??? ??? 98 %     Physical Exam   Constitutional: She appears lethargic. She appears to not be writhing in  pain, not malnourished, not dehydrated and not jaundiced.  Non-toxic appearance. She has a sickly appearance.   HENT:   Head: Normocephalic and atraumatic.   Right Ear: External ear normal.   Left Ear: External ear normal.   Nose: Nose normal.   Mouth/Throat: Oropharynx is clear and moist.   Upper dentures in place.  Not wearing lower dentures.   Eyes: Conjunctivae are normal. Right eye exhibits no discharge. Left eye exhibits no discharge. No scleral icterus. Pupils are equal.   Pupils pinpoint, not reactive, and symmetric.   Neck: Normal range of motion. Neck supple. No tracheal deviation present.   Cardiovascular: Normal rate, regular rhythm and normal heart sounds.   Pulmonary/Chest: Effort normal. No accessory muscle usage or stridor. No respiratory distress. She has no wheezes. She has rales in the right lower field and the left lower field. She exhibits no tenderness.   Abdominal: Soft. Bowel sounds are normal. She exhibits no distension. There is no tenderness. There is no guarding.   Musculoskeletal: She exhibits no edema, tenderness or deformity.   Neurological: She appears lethargic. She displays facial symmetry.   Reflex Scores:       Bicep reflexes are 0 on the right side and 0 on the left side.       Patellar reflexes are 0 on the right side and 0 on the left side.  Patient does not respond to nailbed pressure on all 4 extremities.  She does respond somewhat to loud verbal stimuli with slurred and incomprehensible speech.  Unable to participate with any more neurologic evaluation   Skin: Skin is warm and dry. No rash noted. She is not diaphoretic. No erythema.   Psychiatric:   Unable to assess due to patient being obtunded.        Impression and Management Plan   Ms. Oler is a 67 year old patient with altered mental status.  There is no family with her at this time.  Differential diagnosis includes accidental or intentional medication overdose.  EMS was unable to tell me  if the hydrocodone product that she was taking includes Tylenol.  EMS reports improvement transiently in her mental status with Narcan so it could be an opiate misadventure.  Also considered hepatic encephalopathy, metabolic encephalopathy, infectious etiology, stroke, and hypercapnic respiratory failure.    EKG: By my interpretation shows sinus tachycardia with rate of 103.  Peroneals 202 ms.  QRS is narrow complex at 94 ms.  QT interval corrected is 445 ms.  The T waves appear prominent in V2 V3 and V4.  I reviewed old EKG from 08/06/2017.  T waves definitely appear more prominent today than they did previously..  Diagnostic Studies   Lab:   Recent Results (from the past 12 hour(s))   POC CHEM8    Collection Time: 09/08/17  9:13 AM   Result Value Ref Range    Sodium 126 (L) 136 - 145 mEq/L  Potassium 8.6 (HH) 3.5 - 4.9 mEq/L    Chloride 96 (L) 98 - 107 mEq/L    CO2, TOTAL 20 (L) 21 - 32 mmol/L    Glucose 288 (H) 74 - 106 mg/dL    BUN 77 (H) 7 - 25 mg/dl    Creatinine 7.6 (H) 0.6 - 1.3 mg/dl    HCT 35 (L) 38 - 45 %    HGB 11.9 (L) 12.4 - 17.2 gm/dl    CALCIUM,IONIZED 4.20 (L) 4.40 - 5.40 mg/dL   POC URINE MACROSCOPIC    Collection Time: 09/08/17  9:22 AM   Result Value Ref Range    Glucose 100 (A) NEGATIVE,Negative mg/dl    Bilirubin Negative NEGATIVE,Negative      Ketone Negative NEGATIVE,Negative mg/dl    Specific gravity 1.020 1.005 - 1.030      Blood Small (A) NEGATIVE,Negative      pH (UA) 5.5 5 - 9      Protein 100 (A) NEGATIVE,Negative mg/dl    Urobilinogen 0.2 0.0 - 1.0 EU/dl    Nitrites Negative NEGATIVE,Negative      Leukocyte Esterase Moderate (A) NEGATIVE,Negative      Color Yellow      Appearance Clear     POC URINE MICROSCOPIC    Collection Time: 09/08/17  9:22 AM   Result Value Ref Range    Epithelial cells, squamous 1-4 /LPF    Transitional epithelial cells OCCASIONAL /LPF    WBC 15-29 /HPF    Bacteria 4+ /HPF   DRUG SCREEN, URINE    Collection Time: 09/08/17  9:23 AM   Result Value Ref Range     Amphetamine NEGATIVE NEGATIVE      Barbiturates NEGATIVE NEGATIVE      Benzodiazepines NEGATIVE NEGATIVE      Cocaine NEGATIVE NEGATIVE      Marijuana NEGATIVE NEGATIVE      Methadone NEGATIVE NEGATIVE      Opiates NEGATIVE NEGATIVE      Phencyclidine NEGATIVE NEGATIVE     CBC WITH AUTOMATED DIFF    Collection Time: 09/08/17  9:30 AM   Result Value Ref Range    WBC 15.1 (H) 4.0 - 11.0 1000/mm3    RBC 3.67 3.60 - 5.20 M/uL    HGB 10.8 (L) 13.0 - 17.2 gm/dl    HCT 32.9 (L) 37.0 - 50.0 %    MCV 89.6 80.0 - 98.0 fL    MCH 29.4 25.4 - 34.6 pg    MCHC 32.8 30.0 - 36.0 gm/dl    PLATELET 212 140 - 450 1000/mm3    MPV 11.0 (H) 6.0 - 10.0 fL    RDW-SD 44.0 36.4 - 46.3      NRBC 0 0 - 0      IMMATURE GRANULOCYTES 0.6 0.0 - 3.0 %    NEUTROPHILS 92.1 (H) 34 - 64 %    LYMPHOCYTES 4.6 (L) 28 - 48 %    MONOCYTES 2.6 1 - 13 %    EOSINOPHILS 0.0 0 - 5 %    BASOPHILS 0.1 0 - 3 %   LACTIC ACID    Collection Time: 09/08/17  9:30 AM   Result Value Ref Range    Lactic Acid 10.6 (HH) 0.4 - 2.0 mmol/L   MAGNESIUM    Collection Time: 09/08/17  9:30 AM   Result Value Ref Range    Magnesium 2.4 1.6 - 2.6 mg/dl   TSH 3RD GENERATION    Collection Time: 09/08/17  9:30 AM  Result Value Ref Range    TSH 0.506 0.358 - 3.740 uIU/mL   ETHYL ALCOHOL    Collection Time: 09/08/17  9:30 AM   Result Value Ref Range    ALCOHOL(ETHYL),SERUM NEGATIVE 0.0 - 9.0 mg/dl   ACETAMINOPHEN    Collection Time: 09/08/17  9:30 AM   Result Value Ref Range    Acetaminophen level <2.0 (L) 10.0 - 69.6 mcg/ml   SALICYLATE    Collection Time: 09/08/17  9:30 AM   Result Value Ref Range    Salicylate 1.8 (L) 2.8 - 29.5 mg/dl   METABOLIC PANEL, BASIC    Collection Time: 09/08/17  9:30 AM   Result Value Ref Range    Sodium 127 (L) 136 - 145 mEq/L    Potassium 8.0 (HH) 3.5 - 5.1 mEq/L    Chloride 92 (L) 98 - 107 mEq/L    CO2 15 (L) 21 - 32 mEq/L    Glucose 291 (H) 74 - 106 mg/dl    BUN 67 (H) 7 - 25 mg/dl    Creatinine 7.6 (H) 0.6 - 1.3 mg/dl    GFR est AA 7.0       GFR est non-AA 6      Calcium 8.0 (L) 8.5 - 10.1 mg/dl    Anion gap 19 (H) 5 - 15 mmol/L   HEPATIC FUNCTION PANEL    Collection Time: 09/08/17  9:30 AM   Result Value Ref Range    AST (SGOT) 12 (L) 15 - 37 U/L    ALT (SGPT) 16 12 - 78 U/L    Alk. phosphatase 69 45 - 117 U/L    Bilirubin, total 0.3 0.2 - 1.0 mg/dl    Protein, total 6.8 6.4 - 8.2 gm/dl    Albumin 3.1 (L) 3.4 - 5.0 gm/dl    Bilirubin, direct 0.1 0.0 - 0.2 mg/dl   PROTHROMBIN TIME + INR    Collection Time: 09/08/17  9:30 AM   Result Value Ref Range    Prothrombin time 13.2 (H) 10.2 - 12.9 seconds    INR 1.1 0.1 - 1.1     TROPONIN I    Collection Time: 09/08/17  9:30 AM   Result Value Ref Range    Troponin-I <0.015 0.000 - 0.045 ng/ml   CKMB PROFILE    Collection Time: 09/08/17  9:30 AM   Result Value Ref Range    CK 165 26 - 192 U/L    CK - MB 0.7 0.0 - 3.6 ng/ml    CK-MB Index 0.4 0.0 - 4.9 %   AMMONIA    Collection Time: 09/08/17  9:30 AM   Result Value Ref Range    Ammonia 31 25 - 32 umol/L   NT-PRO BNP    Collection Time: 09/08/17  9:30 AM   Result Value Ref Range    NT pro-BNP 2,810.0 (H) 0.0 - 125.0 pg/ml   EKG, 12 LEAD, INITIAL    Collection Time: 09/08/17 10:38 AM   Result Value Ref Range    Ventricular Rate 103 BPM    Atrial Rate 103 BPM    P-R Interval 202 ms    QRS Duration 94 ms    Q-T Interval 340 ms    QTC Calculation (Bezet) 445 ms    Calculated P Axis 79 degrees    Calculated R Axis 8 degrees    Calculated T Axis 77 degrees    Diagnosis       Sinus tachycardia  Otherwise normal ECG  When  compared with ECG of 06-Aug-2017 20:27,  QRS duration has increased  T wave amplitude has increased in Anterior leads  Confirmed by Mayford Knife, M.D., [place] ([id number]) on 09/08/2017 3:16:40 PM     BLOOD GAS, ARTERIAL    Collection Time: 09/08/17 10:51 AM   Result Value Ref Range    pH 7.070 (LL) 7.350 - 7.450      PCO2 46.5 (H) 35.0 - 45.0 mm Hg    PO2 89.1 75 - 100 mm Hg    BICARBONATE 13.7 (LL) 18.0 - 26.0 mmol/L    O2 SAT 93.0 90 - 100 %     CO2, TOTAL 15.2 (L) 24 - 29 mmol/L    BASE EXCESS -16 (L) -2 - 3 mmol/L    Patient temp. 96.8      Sample type ARTERIAL      SITE 65      ALLENS TEST Positive      Performed by [id number]      Critical action  Critical action: Notify physician      Critical note  Critical notify: Dr. Meda Coffee      READ BACK Read back: Yes      NOTIFY DATE  Notify date: 09/08/2017      NOTIFY TIME  Notify time: 10:55:59     GLUCOSE, POC    Collection Time: 09/08/17 12:48 PM   Result Value Ref Range    Glucose (POC) 350 (H) 65 - 105 mg/dL   PROCALCITONIN    Collection Time: 09/08/17  1:55 PM   Result Value Ref Range    PROCALCITONIN 0.38 0.00 - 0.50 ng/ml   POTASSIUM    Collection Time: 09/08/17  1:55 PM   Result Value Ref Range    Potassium 6.4 (HH) 3.5 - 5.1 mEq/L   LIPASE    Collection Time: 09/08/17  1:55 PM   Result Value Ref Range    Lipase 88 73 - 393 U/L   OSMOLALITY, SERUM/PLASMA    Collection Time: 09/08/17  1:55 PM   Result Value Ref Range    Osmolality, serum/plasma 320 (H) 280 - 301 mOsm/kg   METABOLIC PANEL, BASIC    Collection Time: 09/08/17  1:55 PM   Result Value Ref Range    Sodium 131 (L) 136 - 145 mEq/L    Potassium 7.8 (HH) 3.5 - 5.1 mEq/L    Chloride 100 98 - 107 mEq/L    CO2 14 (LL) 21 - 32 mEq/L    Glucose 362 (H) 74 - 106 mg/dl    BUN 63 (H) 7 - 25 mg/dl    Creatinine 7.6 (H) 0.6 - 1.3 mg/dl    GFR est AA 7.0      GFR est non-AA 6      Calcium 7.3 (L) 8.5 - 10.1 mg/dl    Anion gap 16 (H) 5 - 15 mmol/L   HEP B SURFACE AB    Collection Time: 09/08/17  1:55 PM   Result Value Ref Range    Hepatitis B surface Ab NON-REACTIVE     HEP B SURFACE AG    Collection Time: 09/08/17  1:55 PM   Result Value Ref Range    Hepatitis B surface Ag NON-REACTIVE NON-REACTIVE     POC CHEM8    Collection Time: 09/08/17  1:56 PM   Result Value Ref Range    Sodium 129 (L) 136 - 145 mEq/L    Potassium 7.8 (HH) 3.5 - 4.9 mEq/L    Chloride 103 98 -  107 mEq/L    CO2, TOTAL 13 (LL) 21 - 32 mmol/L    Glucose 350 (H) 74 - 106 mg/dL     BUN 62 (H) 7 - 25 mg/dl    Creatinine 7.4 (H) 0.6 - 1.3 mg/dl    HCT 32 (L) 38 - 45 %    HGB 10.9 (L) 12.4 - 17.2 gm/dl    CALCIUM,IONIZED 3.80 (L) 4.40 - 5.40 mg/dL   BLOOD GAS, ARTERIAL    Collection Time: 09/08/17  2:11 PM   Result Value Ref Range    pH 7.009 (LL) 7.350 - 7.450      PCO2 48.1 (H) 35.0 - 45.0 mm Hg    PO2 139.7 (H) 75 - 100 mm Hg    BICARBONATE 12.2 (LL) 18.0 - 26.0 mmol/L    O2 SAT 97.4 90 - 100 %    CO2, TOTAL 13.7 (LL) 24 - 29 mmol/L    BASE EXCESS -18 (L) -2 - 3 mmol/L    Patient temp. 97.8      Sample type ARTERIAL      FIO2 55      SITE R Brach      DEVICE Adult Vent      ALLENS TEST Positive      Performed by [id number]      Respiratory Rate 12      T VTE 534      PEEP/CPAP 5      Total resp. rate 12      Critical action  Critical action: Notify physician      Critical note  Critical notify: PATEL M      READ BACK Read back: Yes      NOTIFY DATE  Notify date: 09/08/2017      NOTIFY TIME  Notify time: 14:14:04     GLUCOSE, POC    Collection Time: 09/08/17  2:35 PM   Result Value Ref Range    Glucose (POC) 372 (H) 65 - 105 mg/dL   LACTIC ACID    Collection Time: 09/08/17  2:54 PM   Result Value Ref Range    Lactic Acid 11.0 (HH) 0.4 - 2.0 mmol/L   POTASSIUM    Collection Time: 09/08/17  4:20 PM   Result Value Ref Range    Potassium 7.9 (HH) 3.5 - 5.1 mEq/L   METABOLIC PANEL, BASIC    Collection Time: 09/08/17  4:20 PM   Result Value Ref Range    Sodium 130 (L) 136 - 145 mEq/L    Potassium 7.9 (HH) 3.5 - 5.1 mEq/L    Chloride 100 98 - 107 mEq/L    CO2 13 (LL) 21 - 32 mEq/L    Glucose 427 (HH) 74 - 106 mg/dl    BUN 65 (H) 7 - 25 mg/dl    Creatinine 7.2 (H) 0.6 - 1.3 mg/dl    GFR est AA 7.0      GFR est non-AA 6      Calcium 7.5 (L) 8.5 - 10.1 mg/dl    Anion gap 17 (H) 5 - 15 mmol/L   INFLUENZA A/B, BY PCR    Collection Time: 09/08/17  5:20 PM   Result Value Ref Range    Influenza A PCR NEGATIVE NEGATIVE      Influenza B PCR NEGATIVE NEGATIVE     LEGIONELLA PNEUMOPHILA AG, URINE     Collection Time: 09/08/17  5:25 PM   Result Value Ref Range    Legionella Ag, urine NEGATIVE NEGATIVE  S.PNEUMO AG, UR/CSF    Collection Time: 09/08/17  5:25 PM   Result Value Ref Range    Strep pneumo Ag, urine NEGATIVE NEGATIVE     GLUCOSE, POC    Collection Time: 09/08/17  6:17 PM   Result Value Ref Range    Glucose (POC) 300 (H) 65 - 105 mg/dL       Imaging:    Xr Chest Sngl V    Result Date: 09/08/2017  INDICATION: Post intubation   EXAMINATION: XR CHEST SNGL V COMPARISON: 09/08/2017 FINDINGS: The study shows a normal sized heart.. Increased opacity at the right lower lung field.. Known left-sided lung nodule. Also known multiple bullae. Endotracheal tube in good position.     IMPRESSION: 1. Developing right lower lobe infiltrate. Endotracheal tube in good position.     Xr Chest Sngl V    Result Date: 09/08/2017  INDICATION: Altered mental status, hypoxia, bibasilar Rales   EXAMINATION: XR CHEST SNGL V COMPARISON: 08/06/2017 FINDINGS: The study shows a normal sized heart.. Known bilateral lung bullae. Known and stable 18 mm left lower lobe peripheral lung nodule.. No superimposed acute process.     IMPRESSION: 1. Known bullous emphysema. 2. Known stable 18 mm peripheral left lower lobe lung nodule. 3. No acute process.     Ct Head Wo Cont    Result Date: 09/08/2017  INDICATION: Altered mental status.    COMPARISON: None TECHNIQUE: The CT scan of the head was performed without contrast. Coronal and sagittal reconstructions were obtained. DICOM format image data is available to non-affiliated external healthcare facilities or entities on a secure, media free, reciprocally searchable basis with patient authorization for 12 months following the date of the study. FINDINGS: The ventricular system shows normal dimensions and morphology. The white and gray matter is well differentiated. Periventricular confluent white matter hypoattenuation. Rounded 11.6 mm hypodensity in the left posterior fossa  adjacent to the left petrous bone and the tentorium suggesting meningioma. This is unchanged. There is no intracranial bleed or evidence of an acute infarct. No identification of a fracture. Mild ethmoid sinusitis.        IMPRESSION: 1. Chronic microvascular white matter ischemic disease. 2. Small unchanged rounded hyperdense lesion in the left posterior fossa adjacent to the petrous bone and tentorium likely meningioma. 3. Mild ethmoid sinusitis.          ED Course/Medical Decision Making   9:15 AM  I asked the nurse to administer 2 mg of IV Narcan.  There is minimal response.  I asked for a second 2 mg of IV Narcan.  Still no significant response.    CT scan of the head shows chronic microvascular white matter ischemic disease and small unchanged rounded hyperdense lesion in the left posterior fossa likely meningioma.  Mild ethmoid sinusitis.  WBC 15.1.  Hemoglobin hematocrit 11 and 33.  Platelets normal.  Differential shows shows 92% neutrophils.  Urine had 100 glucose, small blood, moderate leukocytes and negative nitrites.  Microscopy was 15-29 WBCs and 4+ bacteria.  I-STAT shows acute renal failure with a BUN of 77 and creatinine is 7.6 hyperkalemia with a potassium of 8.6.  Formal lab work shows a sodium of 127 potassium 8.0.  Bicarb 15 and anion gap of 19.  Blood sugar is 291.  BUN 67 creatinine 7.6.  Again this is consistent with acute renal failure with hyperkalemia.  I have ordered calcium insulin IV, bicarbonate, albuterol nebulizers, glucose.  I have spoken with nephrology, Dr. Lequita Halt.  Patient  has received 30 cc/kg sepsis fluid bolus and will be getting a an additional liter of normal saline beyond that.  Because of the possibility of opiate overdose I ordered salicylate and Tylenol levels.  There is no significant detectable Tylenol.  ProBNP is 2800.  Troponin is nondetectable.  I considered hepatic encephalopathy with a history of hepatitis C so I added an ammonia and LFTs.  The ammonia  is 31 which is normal.  No elevated LFTs.  Considered myxedema, but TSH is normal at 0.506.  Patient had an intact gag but otherwise remained obtunded.  She was not spontaneously opening her eyes.  She was not following commands.  I discussed her care with [facility] hospitalist, Dr. Marzella Schlein.  He asked that the patient be intubated for airway protection.    Intubation  Date/Time: 09/08/2017 12:30 PM  Performed by: Serita Sheller, MD  Authorized by: Serita Sheller, MD     Consent:     Consent obtained:  Emergent situation    Consent given by:  Spouse    Alternatives discussed:  No treatment  Pre-procedure details:     Patient status:  Altered mental status    Pretreatment meds: Etomidate 9 mg IV.    Paralytics:  Vecuronium  Procedure details:     Preoxygenation:  Bag valve mask    CPR in progress: no      Intubation method:  Oral    Laryngoscope blade:  Mac 4    Tube size (mm):  8.0    Tube type:  Cuffed    Number of attempts:  1    Tube visualized through cords: yes    Placement assessment:     ETT to teeth:  23 cm    Tube secured with:  ETT holder    Breath sounds:  Equal    Placement verification: chest rise, condensation, CXR verification, direct visualization, equal breath sounds, ETCO2 detector and fiberoptic scope      CXR findings:  ETT in proper place  Post-procedure details:     Patient tolerance of procedure:  Tolerated well, no immediate complications        Dr. after Pia Mau came to the emergency department to evaluate the patient.  He asked to be repeat an i-STAT potassium.  If it is not improving the patient will likely need emergent dialysis.  I have already admitted the patient to the ICU.  Patient has been seen by Dr. Marzella Schlein.  Final Diagnosis       ICD-10-CM ICD-9-CM   1. Metabolic encephalopathy S01.09 348.31   2. Hyperkalemia E87.5 276.7   3. Acute renal failure, unspecified acute renal failure type ([place]) N17.9 584.9   4. Septic shock (HCC) A41.9 038.9    R65.21 785.52      995.92   5. Obtunded R40.1 780.09       Disposition   Admission to ICU in critical condition.  I have talked to the patient's husband multiple times over the course the day today.  Critical care time excluding procedures, but including direct patient care, reviewing medical records, evaluating results of diagnostic testing, discussions with family members, and consulting with physicians: 12 minutes     Serita Sheller, M.D.  September 08, 2017    My signature above authenticates this document and my orders, the final ??  diagnosis (es), discharge prescription (s), and instructions in the Epic ??  record.  If you have any questions please contact [phone].  ??  Nursing  notes have been reviewed by the physician/ advanced practice ??  Clinician.  Dragon medical dictation software was used for portions of this report. Unintended voice recognition errors may occur.

## 2017-09-08 NOTE — Other (Signed)
.  Acute HEmodialysis flow sheet   Patient information           Name:[name] [name]         MRN: [id number]      EAV:[id number]  TX# Room#PC14/PC14          Hospital: [facility] Care DX: hyperkalemia          [] 1st Time Acute  [x] Stat[]  Routine [] Urgent [] Chronic Unit          [] Acute Room [x] Bedside  [x] ICU/CCU [] ER         Isolation Precautions: [x] Dialysis[]  Airborne [] Contact [] Droplet [] Reverse          Special Considerations:    []  Blood Consent Verified  [x] N/A         Allergies:  []  NKA  [x]  Reviewed  Allergies   Allergen Reactions   ??? Chocolate [Cocoa] Sneezing      Code Status [x] Full Code []  DNR  [] Other        Diet: see chart Diabetic: [x] Yes [] No      Hemodialysis Orders         Physician: Dr. Olga Millers   Dialyzer Revaclear 300 Duration 3 BFR: 250 DFR: 500    Dialysate: 1 K for 2 hours, then 2k for 1 hour  CA 2.5 Na 140 Bicarb 35        Dry Weight: UF Goal: 0 ml         Heparin:  [] Bolus   Units    [] Hourly  Units      [x] None       BP Tx:  [] NS  260ml/Bolus    [] Other     [x] N/A       Labs: Pre:  Post: [x] N/A             Additional Orders: [x] N/A          [x] Signed Treatment Consent   [x]  Verified   []  Obtained    Primary Nurse Report: First initial/ Last Name/Title        Pre Dialysis: D. Eunice Blase, RN   Time:  1735    Access   CATHETER ACCESS:  []  N/A  [x]  RIGHT  []  LEFT  []  IJ  []  SUBCL [x]  FEM                          []  First use X-ray  []  Tunnel     [x]  Non-Tunneled                          [x]  No S/S infection  []  Redness []  Drainage  []  Cultured []  Swelling                         []  Pain                          [x]  Medical Aseptic []  Prep Dressing Changed                          []  Clotted []  Patent [x]       Flows: [x]  Good []  Poor []  Reversed             If Access Problem Dr. Truitt Merle: []  Yes []  No    Date:    [x]  N/A  GRAFT/FISTULA ACCESS:  [x]  N/A  []  RIGHT  []  LEFT  []  UE  []  LE                           []  AVG  []  AVF []  BUTTONHOLE  []  +BRUIT/THRILL        []  MEDICAL ASEPTIC PREP   []  No S/S infection  []  Redness []  Drainage  []  Cultured []  Swelling                         []  Pain                          Needle Gauge                              Length:     If Access Problem Dr. Truitt MerleNotified: []  Yes []  No    Date:     [x]  N/A   General Assessment        LUNGS:  SaO2%  [x]  Clear []  Coarse []  Crackles []  Wheezing               []  Diminished Location: []  RLL []  LLL []  RUL []  LUL         COUGH:  []  Productive []  Dry [x]  N/A  RESPIRATIONS: [x]  Easy []  Labored        THERAPY: []  RA   []  NC     L/min    Mask: []  NRB [x]  Venti  O2%                  []  Ventilator [x]  Intubated []  Trach []  BiPap []  CPap       CARDIAC: [x]  Regular []  Irregular []  Pericardial Rub []  JVD                [x]  Monitored Rhythm:        EDEMA: []  None [x]  Generalized []  Facial []  Pedal []  UE []  LE             []  Pitting []  1 []  2 []  3 []  4    []  Right []  Left []  Bilateral       SKIN:    [x]  Warm []  Hot []  Cold  [x]  Dry []  Pale []  Diaphoretic              []  Flushed []  Jaundiced []  Cyanotic []  Rash []  Weeping         LOC:    []  Alert  Oriented to: []  Person []  Place []  Time             []  Confused []  Lethargic []  Medically sedated [x]  Non-responsive        GI/ABDOMEN: []  Flat []  Distended [x]  Soft []  Firm []  Obese []  Diarrhea   []  Bowel Sounds     [] Nausea []  Vomiting         PAIN: [x]  0 []  1 []  2 []  3 []  4 []  5 []  6 []  7 []  8 []  9 []  10          Scale 1-10 Action/Follow Up        MOBILITY: []  Amb []  Amb/Assist [x]  Bed  []  Wheelchair    CUrrent LABS   HBsAg ONLY: Date Drawn 09/08/17  [x]  Negative []  Positive  []  Unknown.  HBsAb: Date Drawn  09/08/17  [x]  Susceptible <10 []  Immune ?10 []  Unknown   Hepatitis double verified with Lonia Chimera, RN      Date of Current Labs:        Lab Results   Component Value Date/Time    Sodium 130 (L) 09/08/2017 04:20 PM    Potassium 7.9 (HH) 09/08/2017 04:20 PM    Potassium 7.9 (HH) 09/08/2017 04:20 PM    Chloride 100 09/08/2017 04:20 PM     CO2 13 (LL) 09/08/2017 04:20 PM    BUN 65 (H) 09/08/2017 04:20 PM    Creatinine 7.2 (H) 09/08/2017 04:20 PM    Calcium 7.5 (L) 09/08/2017 04:20 PM    Magnesium 2.4 09/08/2017 09:30 AM    Phosphorus 3.3 04/09/2015 03:43 AM    HCT 32 (L) 09/08/2017 01:56 PM    HGB 10.9 (L) 09/08/2017 01:56 PM    PLATELET 212 09/08/2017 09:30 AM    INR 1.1 09/08/2017 09:30 AM      Education        Person Educated: [x]  Patient [x]  Family []  Other         Knowledge base: []  None [x]  Minimal []  Substantial         Barriers to learning  [x]  pt non responsive        Preferred method of learning: []  Written [x]  Oral []  Visual []  Hands on        Topic: []  Access Care []  S&S of infection []  Fluid Management []  K+                 [x]  Procedural    []  Albumin []  Medications []  Tx Options []  Transplant                  []  Diet []  Other         Teaching Tools: [x]  Explain []  Demonstration []  Handout []  Teachback   Ro/hemodiaylsis machine safety checks- before each treatment   Machine Serial #   [x]  # 1 [id number]   []  # 2 [id number]    []  # 3 [id number]   []  # 4 [id number]   []  # 5 [id number]   []  # 6 [id number]   []  # 7 [id number]      Alarm Test: [x]  Pass Time        RO Serial #   []  [id number] (Large dual station RO)  [x]  # 1  [id number]  (Portable # 1)  []  # 2  [id number]  (Portable # 2)  []  # 3  [id number]  (Portable # 3)  []  # 4  [id number]  (Portable # 4)  []  # 5  [id number]  (Portable # 5)       Lot #s: Dialyzer [id number] exp. 06-30-2020  Tubing [id number] exp. 04-12-2022  Saline [id number]  Exp. July 20   [x]  RO/Machine Log Complete    [x]  Extracorporeal circuit Tested for integrity       Dialysate: pH 7.4 Temp. 36  Conductivity: Meter 14 HD Machine  14   chlorine testing- before each treatment and every 4 hours       Total Chlorine: [x]  Less than 0.1 ppm Time:  17352nd Check Time:   (If greater than 0.1 ppm from Primary then every 30 minutes from Secondary)   treatment Iniation-with dialysis precautions    [x]  All Connections Secured   [x]  Saline Line Double Clamped     [x]  Venous Parameters Set    [x]  Arterial  Parameters Set    [x]  Prime Given            [x]  Air Foam Detector Engaged        [facility] Nurse Signature/Title_Arrianne Berline Chough, RN__    Pre-Treatment   Time 1731 B/P 109/51 HR 79 Temp. 95.6 Resp Rate 18 Pre Wt   UF Calculations:   Wt to lose:0  ml(+) Oral:  ml(+) IV Meds/Fluids/Blood prods  ml(+)  Prime/Rinse 500 ml  (=)Total UF Goal 500  mL   Scale Type:[]  Bed scale []  Sling Scale []  Wheel Chair Scale [x]   Not Ordered []  Unable to obtain pt on stretcher   Tx Initiation Note: received pt in PCU, non responsive, intubated, on vent.  HD started using R femoral uldall, access patent, good blood flow, aspirated and flushes well.    [x]  Time Out/Safety Check  Time: 1735   [facility] Signature/Title_Arrianne Berline Chough, RN   INTRADIALYTIC MONITORING  (SEE ATTACHED FLOWSHEET)   POST TREATMENT    Time 2048 B/P127/64 HR 93 Temp 96.9  Resp.  Rate  18 Post wt    Time Medication Dose Volume Route Initials                                   [facility] Signatures Title Initials Time   Layla Maw, RN   RN                 [facility] Signature/Title:_Arrianne Berline Chough, RN_                                  Dialyzer cleared: [x]  Good []  Fair []  Poor    Blood Processed 38.8 Liters            Net UF Removed 0 mL    Post Tx Access:    AVF/AVG: Bleeding Stop                   Art.min Ven min [] +bruit/thrill                             Catheter: Locking Solution heparin 1000u     Art. 1.4  ml Ven  1.4 ml    Post Assessment:  Skin: [x] Warm [x] Dry [] Diaphoretic [] Flushed []  Pale [] Cyanotic          Lungs: [x] Clear [] Coarse [] Crackles [] Wheezing          Cardiac: [x] Regular [] Irregular  [x] Monitored rhythm         Edema: [x] None [] General [] Facial [] Pedal  [] UE [] LE [] RIGHT                      [] LEFT    [] Bilateral      Pain: [x] 0 [] 1[] 2 [] 3 [] 4 [] 5 [] 6 [] 7 [] 8 [] 9 [] 10   POST Tx Note: 3 hours of HD completed,  No fluid removal as ordered, pt  remains unresponsive, intubated, on vent, sedated. Report given to primary nurse.    Primary Nurse Report: First initial/Last name/Title    Post Dialysis:_A. Schwaebler, RN        Time:_2051   Abbreviations: AVG-arterial venous graft, AVF-arterial venous fistula, IJ-Internal Jugular,  Subcl-Subclavian, Fem-Femoral, Tx-treatment, AP/HR-apical heart rate, DFR-dialysate flow rate, BFR-blood flow rate, AP-arterial pressure, VP-venous pressure, UF-ultrafiltrate, TMP-transmembrane pressure, Ven-Venous, Art-Arterial, RO-Reverse Osmosis

## 2017-09-08 NOTE — Progress Notes (Signed)
Daily Vent Progress Note      PATIENT NAME: [name] [name]    ?  Principal Problem:  AMS, Airway protection      Active Problems:   Problem List as of 10-02-2017 Date Reviewed: 10-02-17          Codes Class Noted - Resolved    Hyperkalemia ICD-10-CM: E87.5  ICD-9-CM: 276.7  10-02-17 - Present        Obtunded ICD-10-CM: R40.1  ICD-9-CM: 780.09  10-02-17 - Present        Acute renal failure (ARF) (HCC) ICD-10-CM: N17.9  ICD-9-CM: 584.9  10-02-2017 - Present        Septic shock (HCC) ICD-10-CM: A41.9, R65.21  ICD-9-CM: 038.9, 785.52, 995.92  02-Oct-2017 - Present        Metabolic encephalopathy ICD-10-CM: G93.41  ICD-9-CM: 348.31  10/02/2017 - Present        Dehydration ICD-10-CM: E86.0  ICD-9-CM: 276.51  08/07/2017 - Present        COPD with acute exacerbation (HCC) ICD-10-CM: J44.1  ICD-9-CM: 491.21  08/07/2017 - Present        Acute-on-chronic kidney injury (HCC) ICD-10-CM: N17.9, N18.9  ICD-9-CM: 584.9, 585.9  08/07/2017 - Present        Chest pain ICD-10-CM: R07.9  ICD-9-CM: 786.50  06/15/2017 - Present        Dyspnea ICD-10-CM: R06.00  ICD-9-CM: 786.09  06/15/2017 - Present        Type 2 diabetes mellitus with diabetic neuropathy (HCC) ICD-10-CM: E11.40  ICD-9-CM: 250.60, 357.2  12/07/2016 - Present        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9  ICD-9-CM: 038.9, 995.91  07/24/2016 - Present        Asthma exacerbation ICD-10-CM: J45.901   ICD-9-CM: 493.92  07/24/2016 - Present        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: E11.21  ICD-9-CM: 250.40, 583.81  06/12/2016 - Present        Sacroiliitis (HCC) ICD-10-CM: M46.1  ICD-9-CM: 720.2  11/25/2015 - Present        Essential hypertension ICD-10-CM: I10  ICD-9-CM: 401.9  10/06/2015 - Present        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  ICD-9-CM: 721.3  09/15/2015 - Present        Lumbar and sacral osteoarthritis ICD-10-CM: M47.817  ICD-9-CM: 721.3  09/15/2015 - Present        Chronic pain syndrome ICD-10-CM: G89.4  ICD-9-CM: 338.4  09/15/2015 - Present        Type 2 diabetes mellitus with hyperglycemia, without long-term current use of insulin (HCC) ICD-10-CM: E11.65  ICD-9-CM: 250.00, 790.29  08/20/2015 - Present        Acute colitis ICD-10-CM: K52.9  ICD-9-CM: 558.9  07/02/2015 - Present        Acute hyperglycemia ICD-10-CM: R73.9  ICD-9-CM: 790.29  07/02/2015 - Present        Accelerated hypertension ICD-10-CM: I10  ICD-9-CM: 401.0  04/08/2015 - Present        Severe headache ICD-10-CM: R51  ICD-9-CM: 784.0  04/07/2015 - Present        Osteoarthritis of hips, 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  ICD-9-CM: 715.95  01/29/2015 - Present        PTSD (post-traumatic stress disorder) (Chronic) ICD-10-CM: F43.10  ICD-9-CM: 309.81  01/29/2015 - Present        Severe hypertension (Chronic) ICD-10-CM: I10  ICD-9-CM: 401.9  07/21/2014 - Present        RESOLVED: New onset type 1 diabetes mellitus, uncontrolled (HCC) ICD-10-CM: E10.65  ICD-9-CM: 250.03  07/02/2015 - 10/06/2015        RESOLVED: Recurrent major depressive disorder, in full remission (HCC) ICD-10-CM: F33.42  ICD-9-CM: 296.36  12/02/2014 - 12/02/2014        RESOLVED: Foreign body in colon ICD-10-CM: T18.4XXA  ICD-9-CM: 936  08/14/2014 - 01/29/2015        RESOLVED: Advanced care planning/counseling discussion ICD-10-CM: Z61.09  ICD-9-CM: V65.49  07/21/2014 - 01/29/2015     Overview Signed 07/21/2014  3:46 PM by Littie Deeds, MD     Patient given [place] application               RESOLVED: Hip pain, chronic ICD-10-CM: M25.559, G89.29  ICD-9-CM: 719.45, 338.29  07/21/2014 - 01/29/2015                   LOS: 0 days       Vital signs in last 24 hours:  Temp: 95.9 ??F (35.5 ??C) (09/08/17 1601)  Pulse (Heart Rate): 94 (09/08/17 1605)  Resp Rate: 18 (09/08/17 1605)  BP: 96/48 (09/08/17 1601)  FIO2 (%): 40 %  FIO2 (%): 40 % (09/08/17 1605)      Last ABG;     pH   Date Value Ref Range Status   09/08/2017 7.009 (LL) 7.350 - 7.450   Final     PCO2   Date Value Ref Range Status   09/08/2017 48.1 (H) 35.0 - 45.0 mm Hg Final     PO2   Date Value Ref Range Status   09/08/2017 139.7 (H) 75 - 100 mm Hg Final     BICARBONATE   Date Value Ref Range Status   09/08/2017 12.2 (LL) 18.0 - 26.0 mmol/L Final     O2 SAT   Date Value Ref Range Status   09/08/2017 97.4 90 - 100 % Final     BASE EXCESS   Date Value Ref Range Status   09/08/2017 -18 (L) -2 - 3 mmol/L Final     SITE   Date Value Ref Range Status   09/08/2017 R Brach   Final     Sample type   Date Value Ref Range Status   09/08/2017 ARTERIAL   Final     ALLENS TEST   Date Value Ref Range Status   09/08/2017 Positive   Final     FIO2   Date Value Ref Range Status   09/08/2017 55   Final     DEVICE   Date Value Ref Range Status   09/08/2017 Adult Vent   Final          ARDS is classified according to the degree of hypoxemia (PaO2/FiO2 ratio),  mild (PaO2/FiO2, 201???300), moderate (PaO2/FiO2, 101???200), and severe (PaO2/FiO2???100)           ET -TUBE: 8.0, 22 @ the lip      Ventilator Mode: Pressure control (09/08/17 1605)  FIO2 (%): 40 % (09/08/17 1605)      PEEP/VENT (cm H2O): 5 cm H20 (09/08/17 1605)   MAP (cm H2O): 12 (09/08/17 1605)    Breath Sounds:  Clear, diminished    Secretions:   Small amount of clear thick    Chest CXR:   CXR Results  (Last 48 hours)               09/08/17 1317  XR CHEST SNGL V Final result     Impression:  IMPRESSION:   1. Developing right lower lobe infiltrate. Endotracheal tube in good position.           Narrative:  INDICATION:   Post intubation          EXAMINATION:   XR CHEST SNGL V       COMPARISON:   09/08/2017       FINDINGS:   The study shows a normal sized heart.. Increased opacity at the right lower lung   field.. Known left-sided lung nodule. Also known multiple bullae. Endotracheal   tube in good position.           09/08/17 0940  XR CHEST SNGL V Final result    Impression:  IMPRESSION:   1. Known bullous emphysema.   2. Known stable 18 mm peripheral left lower lobe lung nodule.   3. No acute process.           Narrative:  INDICATION:   Altered mental status, hypoxia, bibasilar Rales          EXAMINATION:   XR CHEST SNGL V       COMPARISON:   08/06/2017       FINDINGS:   The study shows a normal sized heart.. Known bilateral lung bullae. Known and   stable 18 mm left lower lobe peripheral lung nodule.. No superimposed acute   process.                 Weaning Trial:  Not completed    Weaning Parameters:   Not completed    Summary:  Patient transferred from ER. Patient is supposed to have dialysis once access is gained. Otherwise patient is stable.    Plan:  Continue current ventilation    Electronically Signed By: Durwin NoraBrittany Monay Parham, RT
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
## 2017-09-08 NOTE — Progress Notes (Signed)
[facility] Dosing [facility]:     Consult for Vancomycin Dosing by Pharmacy by Dr. Delton SeeNelson in Er / [facility]vine[facility]  Consult provided for this 67 y.o. year old female , for indication of sepsis/ AKI  Day of Therapy 0    Ht Readings from Last 1 Encounters:   09/08/17 172.7 cm (68")        Wt Readings from Last 1 Encounters:   09/08/17 90.7 kg (200 lb)        Previous Regimen N/a   Last Level N/a   Other Current Antibiotics zosyn   Significant Cultures N/a   Serum Creatinine Lab Results   Component Value Date/Time    Creatinine 7.4 (H) 09/08/2017 01:56 PM        Creatinine Clearance Estimated Creatinine Clearance: 8.9 mL/min (A) (based on SCr of 7.4 mg/dL (H)).   BUN Lab Results   Component Value Date/Time    BUN 62 (H) 09/08/2017 01:56 PM        WBC Lab Results   Component Value Date/Time    WBC 15.1 (H) 09/08/2017 09:30 AM        H/H Lab Results   Component Value Date/Time    HGB 10.9 (L) 09/08/2017 01:56 PM        Platelets Lab Results   Component Value Date/Time    PLATELET 212 09/08/2017 09:30 AM        Temp 96 ??F (35.6 ??C)     Start Vancomycin therapy, with loading dose of 1750 (mg) at 11-12:00 given(time/date).  Follow with maintenance dose of 750(mg) at 20:00/ post HD fri 3/29 (time/date),     Dose calculated to approximate a therapeutic trough of 15-20 mcg/mL.    Pls watch for schedule, order maint doses and level.    Pharmacy to follow daily and will make changes to dose and/or frequency based on clinical status.    Additional recommendations regarding antibiotic therapy:  _________________________________     Pharmacist Sharin Monsharles Craig Latunya Kissick, PHARMD

## 2017-09-08 NOTE — Progress Notes (Signed)
Code R Consult Received.    After Hours Consult:      No    Last admission dc date and dx      08/09/17    Dehydration       What was the discharge plan   [facility] w [facility]     Now presents     Unresponsive     Case Discussed with ED Physician:   N    Case Discussed with Attending Physician:    N    Appropriate for Admission:      Yes Inpatient   ICU    Anticipated Discharge Needs:    [facility]    Disposition if discharged from the ED     admitted    Readmission interview done (epic)     N    Readmission assessment completed (compare AD)   Mikael SprayY    [employer] complete Y

## 2017-09-08 NOTE — Progress Notes (Signed)
prelim code r note  ??  Last admission date  08/09/17    reason for last admission   Dehydration    MD   Dr Langston MaskerMorris  ??  disposition at discharge   Home w [facility]   Comfort Care  ??  Insurance   [employer]  ??

## 2017-09-08 NOTE — ED Notes (Signed)
TRANSFER - OUT REPORT:    Verbal report [facility] to [facility]name[facility] on [name] [name]  being transferred to [facility]U 14(unit) for routine progression of care       Report consisted of patient???s Situation, Background, Assessment and   Recommendations(SBAR).     Information from the following report(s) SBAR was reviewed with the receiving nurse.    Lines:   Peripheral IV 09/08/17 Right Arm (Active)   Site Assessment Clean, dry, & intact 09/08/2017  8:55 AM   Phlebitis Assessment 0 09/08/2017  8:55 AM   Infiltration Assessment 0 09/08/2017  8:55 AM   Dressing Status Clean, dry, & intact 09/08/2017  8:55 AM   Dressing Type Transparent 09/08/2017  8:55 AM   Hub Color/Line Status Pink 09/08/2017  8:55 AM   Alcohol Cap Used Yes 09/08/2017  8:55 AM       Peripheral IV 09/08/17 Right Antecubital (Active)        Opportunity for questions and clarification was provided.      Patient transported with:   Monitor  RN, Tech and Respiratory

## 2017-09-08 NOTE — Consults (Addendum)
[facility]     Pulmonary Consult Note    Patient: [name] [name]               Sex: female          DOA: 09/08/2017       Date of Birth:  04-29-1951      Age:  67 y.o.        LOS:  LOS: 0 days        Reason for Consult: Altered mental status and for airway protection requiring intubation and ventilator support/hypotension    IMPRESSION:   ?? Altered mental status and poor airway protection requiring intubation and ventilator support  ?? Hypotension hypovolemic versus septic shock  ?? Acute renal failure leading to severe hyperkalemia and metabolic acidosis  ?? Severe lactic acidosis  ?? Leukocytosis  ?? Chronic obstructive lung disease and  Stable pulmonary nodule (18 mm X 16 mm LLL)  ?? Hepatitis C  ?? Report of nausea and vomiting   RECOMMENDATIONS:   ?? Continue ventilator management as per protocol.  Titrate FiO2, and target oxygen saturation greater than 88%.  ?? Titrate Levophed, and target mean arterial pressure greater than 65 MMG  ?? Continue to fluid resuscitation.  Follow ins and outs.  Follow renal function.  Further management of acute kidney injury as per nephrology.  ?? Continue to monitor potassium level.  Status post D50, IV insulin, and calcium gluconate.  ?? Continue bicarb drip as recommended by nephrology for severe metabolic acidosis  ?? Follow serial lactic acid level until normalization  ?? Continue vancomycin and Zosyn for infection of unknown origin.  Consider flu PCR.  Follow culture results  ?? Follow renal ultrasonogram  ?? Consider subcu heparin for DVT prophylaxis  ?? Will defer respective systems problem management to primary and other consultant and follow patient with primary and other team  ?? Further recommendations will be based on the patient's response to recommended treatment and results of the investigation ordered.     History obtained from chart  HPI:   Ms. [name] [name] is a 67 y.o. female who carries known history of  chronic pain, hypertension, hepatitis C, GERD, COPD, diabetes, marijuana use who presented to the [facility] ER via EMS with altered mental status requiring intubation for airway protection.  Patient on chart review it seems patient has had nausea and vomiting before she presented to the ER.  Lab analysis demonstrated findings consistent with acute renal failure, metabolic acidosis and hyperkalemia.  Patient has remained hypotensive despite of fluid resuscitation currently requiring pressor.  Further history available at this time.    Review of Systems  Review of Systems   Unable to perform ROS: Intubated         Past Medical History  Past Medical History:   Diagnosis Date   ??? Arthritis    ??? 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 COPD (chronic obstructive pulmonary disease) ([place])    ??? DM2 (diabetes mellitus, type 2) ([place])    ??? GERD (gastroesophageal reflux disease)    ??? Hepatitis C     HEP C   ??? HTN (hypertension)    ??? Lung nodule, multiple     (CT 10/2016) Small left upper lobe and 1.7 x 1.6 cm left lower lobe nodules not significantly changed from 07/23/2016 and 03/22/2016   ??? Marijuana use    ??? PTSD (post-traumatic stress disorder)  lived through the [place] in 1993   ??? Thoracic ascending aortic aneurysm ([place])     4.4cm noted on CT Chest (10/2016)   ??? Thyroid nodule     2.5 cm stable right thyroid nodule (CT 10/2016)       Past Surgical History  Past Surgical History:   Procedure Laterality Date   ??? HX ENDOSCOPY     ??? HX HIP REPLACEMENT Right 01/29/2015   ??? HX HIP REPLACEMENT     ??? HX HYSTERECTOMY  2010    hysterectomy       Family History  Family History   Problem Relation Age of Onset   ??? Hypertension Mother    ??? Other Mother         OSA   ??? Cancer Father         suspected leukemia per pt's spouse   ??? Cancer Maternal Aunt    ??? Alcohol abuse Maternal Grandfather        Social History  Social History     Tobacco Use   ??? Smoking status: Former Smoker    ??? Smokeless tobacco: Never Used   ??? Tobacco comment: reported as quit smoking around 1990s per spouse   Substance Use Topics   ??? Alcohol use: No   ??? Drug use: Yes     Types: Marijuana        Medications  Current Facility-Administered Medications   Medication Dose Route Frequency   ??? piperacillin-tazobactam (ZOSYN) 3.375 g in 0.9% sodium chloride (MBP/ADV) 100 mL MBP  3.375 g IntraVENous Q12H   ??? sodium chloride 0.9 % bolus infusion 1,000 mL  1,000 mL IntraVENous NOW   ??? albuterol (PROVENTIL VENTOLIN) nebulizer solution 2.5 mg  2.5 mg Nebulization Q2H PRN   ??? albuterol-ipratropium (DUO-NEB) 2.5 MG-0.5 MG/3 ML  3 mL Nebulization Q6H PRN   ??? dextrose (D50W) injection syrg 25 g  25 g IntraVENous PRN   ??? glucagon (GLUCAGEN) injection 1 mg  1 mg IntraMUSCular PRN   ??? insulin lispro (HUMALOG) injection 1-100 Units  1-100 Units SubCUTAneous PRN   ??? [START ON 09/09/2017] white petrolatum-mineral oil (GENTEAL PM) 85-15 % ophthalmic ointment 1 Each  1 Each Both Eyes DAILY   ??? midazolam in normal saline (VERSED) 1 mg/mL infusion  1-15 mg/hr IntraVENous TITRATE   ??? fentaNYL citrate (PF) injection 100-200 mcg  100-200 mcg IntraVENous Q1H PRN   ??? NOREPINephrine (LEVOPHED) 8 mg in 0.9% NS 257m infusion  2-16 mcg/min IntraVENous TITRATE   ??? naloxone (NARCAN) injection 0.1 mg  0.1 mg IntraVENous PRN   ??? sodium bicarbonate (8.4%) 150 mEq in dextrose 5% 850 mL infusion   IntraVENous CONTINUOUS   ??? acetaminophen (TYLENOL) tablet 650 mg  650 mg Oral Q4H PRN    Or   ??? acetaminophen (TYLENOL) solution 650 mg  650 mg Oral Q4H PRN    Or   ??? acetaminophen (TYLENOL) suppository 650 mg  650 mg Rectal Q4H PRN   ??? ondansetron (ZOFRAN) injection 4 mg  4 mg IntraVENous Q4H PRN   ??? albuterol-ipratropium (DUO-NEB) 2.5 MG-0.5 MG/3 ML  3 mL Nebulization Q6H RT   ??? heparin (porcine) injection 5,000 Units  5,000 Units SubCUTAneous Q8H   ??? dextrose (D50) infusion 5-25 g  10-50 mL IntraVENous PRN    ??? glucagon (GLUCAGEN) injection 1 mg  1 mg IntraMUSCular PRN   ??? insulin glargine (LANTUS) injection 1-100 Units  1-100 Units SubCUTAneous QHS   ??? insulin  lispro (HUMALOG) injection 1-100 Units  1-100 Units SubCUTAneous Q6H   ??? albuterol-ipratropium (DUO-NEB) 2.5 MG-0.5 MG/3 ML  3 mL Nebulization Q4H PRN   ??? [START ON 09/09/2017] pantoprazole (PROTONIX) 40 mg in sodium chloride 0.9% 10 mL injection  40 mg IntraVENous DAILY   ??? propofol (DIPRIVAN) infusion  0-50 mcg/kg/min IntraVENous TITRATE   ??? propofol (DIPRIVAN) 10 mg/mL injection       ??? calcium gluconate 100 mg/mL (10%) injection       ??? *vancomycin pharmacy dosing   1 Each Other Rx Dosing/Monitoring   ??? calcium gluconate 2 g in 0.9% sodium chloride 100 mL IVPB  2 g IntraVENous ONCE   ??? insulin regular (NOVOLIN R, HUMULIN R) injection 10 Units  10 Units IntraVENous ONCE   ??? dextrose (D50W) injection syrg 25 g  50 mL IntraVENous ONCE     Prior to Admission medications    Medication Sig Start Date End Date Taking? Authorizing Provider   spironolactone (ALDACTONE) 100 mg tablet TAKE 1 TABLET BY MOUTH DAILY 08/16/17   Caryl Bis, FNP   montelukast (SINGULAIR) 10 mg tablet TAKE 1 TABLET BY MOUTH DAILY 08/16/17   Wallene Huh, PA-C   nabumetone (RELAFEN) 750 mg tablet TAKE 1 TABLET BY MOUTH TWICE DAILY AS NEEDED FOR PAIN. USE SPARINGLY 08/14/17   Kemeni, Lavone Orn, FNP   cloNIDine HCl (CATAPRES) 0.3 mg tablet TAKE ONE TABLET BY MOUTH TWICE DAILY 08/14/17   Caryl Bis, FNP   magnesium oxide (MAG-OX) 400 mg tablet TAKE 1 TABLET BY MOUTH DAILY 08/14/17   Caryl Bis, FNP   traZODone (DESYREL) 50 mg tablet Take 1 Tab by mouth nightly as needed for Sleep. 08/09/17   Einar Grad, MD   tiotropium bromide (SPIRIVA RESPIMAT) 2.5 mcg/actuation inhaler Take 1 Puff by inhalation daily. 08/10/17   Einar Grad, MD   albuterol (PROVENTIL HFA, VENTOLIN HFA, PROAIR HFA) 90 mcg/actuation inhaler Take 2 Puffs by inhalation every six (6) hours for 5 days, THEN 2  Puffs every four (4) hours as needed for Wheezing or Shortness of Breath for up to 30 days. 08/09/17 09/13/17  Einar Grad, MD   fluticasone-salmeterol (ADVAIR DISKUS) 100-50 mcg/dose diskus inhaler Take 1 Puff by inhalation two (2) times a day. 08/09/17   Einar Grad, MD   losartan (COZAAR) 100 mg tablet TAKE 1 TABLET BY MOUTH DAILY FOR HYPERTENSION 07/17/17   [place] Bells, MD   QUEtiapine SR (SEROQUEL XR) 150 mg sr tablet TAKE 1 TABLET BY MOUTH NIGHTLY 07/13/17   [place] Bells, MD   amLODIPine (NORVASC) 10 mg tablet TAKE ONE TABLET BY MOUTH ONCE DAILY 07/13/17   [place] Bells, MD   rosuvastatin (CRESTOR) 5 mg tablet TAKE 1 TABLET BY MOUTH NIGHTLY 07/13/17   [place] Bells, MD   metFORMIN ER (GLUCOPHAGE XR) 500 mg tablet 2 tabs BID 07/13/17   Tharakan, Sonnie Alamo, MD   albuterol (ACCUNEB) 1.25 mg/3 mL nebu 3 mL by Nebulization route every six (6) hours as needed. 06/18/17   Edger House, MD   polyethylene glycol (MIRALAX) 17 gram packet Take 1 Packet by mouth daily. 05/09/17   [place] Bells, MD   triamcinolone (NASACORT) 55 mcg nasal inhaler 2 Sprays by Both Nostrils route daily. 02/22/17   Wallene Huh, PA-C   benzonatate (TESSALON PERLES) 100 mg capsule Take 1 Cap by mouth three (3) times daily as needed for Cough. 01/09/17   Antoine Primas, PA-C   Blood-Glucose Meter monitoring kit  Check sugars 2 times per day, fasting and 2 hours after dinner 07/15/15   [place] Bells, MD   Lancets misc Check sugars twice daily 07/15/15   [place] Bells, MD   glucose blood VI test strips (ASCENSIA AUTODISC VI, ONE TOUCH ULTRA TEST VI) strip Check sugars twice daily 07/15/15   [place] Bells, MD       Allergy  Allergies   Allergen Reactions   ??? Chocolate [Cocoa] Sneezing       Physical Exam:   Vital Signs:    Blood pressure (!) 88/56, pulse 98, temperature 96 ??F (35.6 ??C), resp. rate 18, height 5' 8"  (1.727 m), weight 90.7 kg (200 lb), SpO2 100 %. Body mass index is 30.41 kg/m??.     O2 Device: Ventilator   O2 Flow Rate (L/min): 4 l/min   Temp (24hrs), Avg:96.3 ??F (35.7 ??C), Min:96 ??F (35.6 ??C), Max:96.6 ??F (35.9 ??C)       Intake/Output:   Last shift:      03/29 0701 - 03/29 1900  In: 100 [I.V.:100]  Out: 10 [Urine:10]  Last 3 shifts: No intake/output data recorded.    Intake/Output Summary (Last 24 hours) at 09/08/2017 1539  Last data filed at 09/08/2017 1213  Gross per 24 hour   Intake 100 ml   Output 10 ml   Net 90 ml      Physical Exam   Constitutional: She appears well-developed.   HENT:   Head: Normocephalic and atraumatic.   Eyes: Pupils are equal, round, and reactive to light. EOM are normal.   Neck: Normal range of motion. Neck supple.   Cardiovascular: Regular rhythm.   Pulmonary/Chest: Effort normal. She has no wheezes.   Abdominal: Soft.   Musculoskeletal: She exhibits no edema.   Neurological:    Did not respond to physical stimulus   Skin: Skin is warm and dry.   Nursing note and vitals reviewed.          Labs Reviewed:  Recent Results (from the past 24 hour(s))   POC CHEM8    Collection Time: 09/08/17  9:13 AM   Result Value Ref Range    Sodium 126 (L) 136 - 145 mEq/L    Potassium 8.6 (HH) 3.5 - 4.9 mEq/L    Chloride 96 (L) 98 - 107 mEq/L    CO2, TOTAL 20 (L) 21 - 32 mmol/L    Glucose 288 (H) 74 - 106 mg/dL    BUN 77 (H) 7 - 25 mg/dl    Creatinine 7.6 (H) 0.6 - 1.3 mg/dl    HCT 35 (L) 38 - 45 %    HGB 11.9 (L) 12.4 - 17.2 gm/dl    CALCIUM,IONIZED 4.20 (L) 4.40 - 5.40 mg/dL   POC URINE MACROSCOPIC    Collection Time: 09/08/17  9:22 AM   Result Value Ref Range    Glucose 100 (A) NEGATIVE,Negative mg/dl    Bilirubin Negative NEGATIVE,Negative      Ketone Negative NEGATIVE,Negative mg/dl    Specific gravity 1.020 1.005 - 1.030      Blood Small (A) NEGATIVE,Negative      pH (UA) 5.5 5 - 9      Protein 100 (A) NEGATIVE,Negative mg/dl    Urobilinogen 0.2 0.0 - 1.0 EU/dl    Nitrites Negative NEGATIVE,Negative      Leukocyte Esterase Moderate (A) NEGATIVE,Negative      Color Yellow       Appearance Clear     POC URINE MICROSCOPIC  Collection Time: 09/08/17  9:22 AM   Result Value Ref Range    Epithelial cells, squamous 1-4 /LPF    Transitional epithelial cells OCCASIONAL /LPF    WBC 15-29 /HPF    Bacteria 4+ /HPF   DRUG SCREEN, URINE    Collection Time: 09/08/17  9:23 AM   Result Value Ref Range    Amphetamine NEGATIVE NEGATIVE      Barbiturates NEGATIVE NEGATIVE      Benzodiazepines NEGATIVE NEGATIVE      Cocaine NEGATIVE NEGATIVE      Marijuana NEGATIVE NEGATIVE      Methadone NEGATIVE NEGATIVE      Opiates NEGATIVE NEGATIVE      Phencyclidine NEGATIVE NEGATIVE     CBC WITH AUTOMATED DIFF    Collection Time: 09/08/17  9:30 AM   Result Value Ref Range    WBC 15.1 (H) 4.0 - 11.0 1000/mm3    RBC 3.67 3.60 - 5.20 M/uL    HGB 10.8 (L) 13.0 - 17.2 gm/dl    HCT 32.9 (L) 37.0 - 50.0 %    MCV 89.6 80.0 - 98.0 fL    MCH 29.4 25.4 - 34.6 pg    MCHC 32.8 30.0 - 36.0 gm/dl    PLATELET 212 140 - 450 1000/mm3    MPV 11.0 (H) 6.0 - 10.0 fL    RDW-SD 44.0 36.4 - 46.3      NRBC 0 0 - 0      IMMATURE GRANULOCYTES 0.6 0.0 - 3.0 %    NEUTROPHILS 92.1 (H) 34 - 64 %    LYMPHOCYTES 4.6 (L) 28 - 48 %    MONOCYTES 2.6 1 - 13 %    EOSINOPHILS 0.0 0 - 5 %    BASOPHILS 0.1 0 - 3 %   LACTIC ACID    Collection Time: 09/08/17  9:30 AM   Result Value Ref Range    Lactic Acid 10.6 (HH) 0.4 - 2.0 mmol/L   MAGNESIUM    Collection Time: 09/08/17  9:30 AM   Result Value Ref Range    Magnesium 2.4 1.6 - 2.6 mg/dl   TSH 3RD GENERATION    Collection Time: 09/08/17  9:30 AM   Result Value Ref Range    TSH 0.506 0.358 - 3.740 uIU/mL   ETHYL ALCOHOL    Collection Time: 09/08/17  9:30 AM   Result Value Ref Range    ALCOHOL(ETHYL),SERUM NEGATIVE 0.0 - 9.0 mg/dl   ACETAMINOPHEN    Collection Time: 09/08/17  9:30 AM   Result Value Ref Range    Acetaminophen level <2.0 (L) 10.0 - 03.4 mcg/ml   SALICYLATE    Collection Time: 09/08/17  9:30 AM   Result Value Ref Range    Salicylate 1.8 (L) 2.8 - 74.2 mg/dl   METABOLIC PANEL, BASIC     Collection Time: 09/08/17  9:30 AM   Result Value Ref Range    Sodium 127 (L) 136 - 145 mEq/L    Potassium 8.0 (HH) 3.5 - 5.1 mEq/L    Chloride 92 (L) 98 - 107 mEq/L    CO2 15 (L) 21 - 32 mEq/L    Glucose 291 (H) 74 - 106 mg/dl    BUN 67 (H) 7 - 25 mg/dl    Creatinine 7.6 (H) 0.6 - 1.3 mg/dl    GFR est AA 7.0      GFR est non-AA 6      Calcium 8.0 (L) 8.5 - 10.1 mg/dl  Anion gap 19 (H) 5 - 15 mmol/L   HEPATIC FUNCTION PANEL    Collection Time: 09/08/17  9:30 AM   Result Value Ref Range    AST (SGOT) 12 (L) 15 - 37 U/L    ALT (SGPT) 16 12 - 78 U/L    Alk. phosphatase 69 45 - 117 U/L    Bilirubin, total 0.3 0.2 - 1.0 mg/dl    Protein, total 6.8 6.4 - 8.2 gm/dl    Albumin 3.1 (L) 3.4 - 5.0 gm/dl    Bilirubin, direct 0.1 0.0 - 0.2 mg/dl   PROTHROMBIN TIME + INR    Collection Time: 09/08/17  9:30 AM   Result Value Ref Range    Prothrombin time 13.2 (H) 10.2 - 12.9 seconds    INR 1.1 0.1 - 1.1     TROPONIN I    Collection Time: 09/08/17  9:30 AM   Result Value Ref Range    Troponin-I <0.015 0.000 - 0.045 ng/ml   CKMB PROFILE    Collection Time: 09/08/17  9:30 AM   Result Value Ref Range    CK 165 26 - 192 U/L    CK - MB 0.7 0.0 - 3.6 ng/ml    CK-MB Index 0.4 0.0 - 4.9 %   AMMONIA    Collection Time: 09/08/17  9:30 AM   Result Value Ref Range    Ammonia 31 25 - 32 umol/L   NT-PRO BNP    Collection Time: 09/08/17  9:30 AM   Result Value Ref Range    NT pro-BNP 2,810.0 (H) 0.0 - 125.0 pg/ml   EKG, 12 LEAD, INITIAL    Collection Time: 09/08/17 10:38 AM   Result Value Ref Range    Ventricular Rate 103 BPM    Atrial Rate 103 BPM    P-R Interval 202 ms    QRS Duration 94 ms    Q-T Interval 340 ms    QTC Calculation (Bezet) 445 ms    Calculated P Axis 79 degrees    Calculated R Axis 8 degrees    Calculated T Axis 77 degrees    Diagnosis       Sinus tachycardia  Otherwise normal ECG  When compared with ECG of 06-Aug-2017 20:27,  QRS duration has increased  T wave amplitude has increased in Anterior leads   Confirmed by Mayford Knife, M.D., [place] ([id number]) on 09/08/2017 3:16:40 PM     BLOOD GAS, ARTERIAL    Collection Time: 09/08/17 10:51 AM   Result Value Ref Range    pH 7.070 (LL) 7.350 - 7.450      PCO2 46.5 (H) 35.0 - 45.0 mm Hg    PO2 89.1 75 - 100 mm Hg    BICARBONATE 13.7 (LL) 18.0 - 26.0 mmol/L    O2 SAT 93.0 90 - 100 %    CO2, TOTAL 15.2 (L) 24 - 29 mmol/L    BASE EXCESS -16 (L) -2 - 3 mmol/L    Patient temp. 96.8      Sample type ARTERIAL      SITE 65      ALLENS TEST Positive      Performed by [id number]      Critical action  Critical action: Notify physician      Critical note  Critical notify: Dr. Meda Coffee      READ BACK Read back: Yes      NOTIFY DATE  Notify date: 09/08/2017      NOTIFY TIME  Notify time: 10:55:59  GLUCOSE, POC    Collection Time: 09/08/17 12:48 PM   Result Value Ref Range    Glucose (POC) 350 (H) 65 - 105 mg/dL   PROCALCITONIN    Collection Time: 09/08/17  1:55 PM   Result Value Ref Range    PROCALCITONIN 0.38 0.00 - 0.50 ng/ml   POTASSIUM    Collection Time: 09/08/17  1:55 PM   Result Value Ref Range    Potassium 6.4 (HH) 3.5 - 5.1 mEq/L   LIPASE    Collection Time: 09/08/17  1:55 PM   Result Value Ref Range    Lipase 88 73 - 393 U/L   OSMOLALITY, SERUM/PLASMA    Collection Time: 09/08/17  1:55 PM   Result Value Ref Range    Osmolality, serum/plasma 320 (H) 280 - 301 mOsm/kg   METABOLIC PANEL, BASIC    Collection Time: 09/08/17  1:55 PM   Result Value Ref Range    Sodium 131 (L) 136 - 145 mEq/L    Potassium 7.8 (HH) 3.5 - 5.1 mEq/L    Chloride 100 98 - 107 mEq/L    CO2 14 (LL) 21 - 32 mEq/L    Glucose 362 (H) 74 - 106 mg/dl    BUN 63 (H) 7 - 25 mg/dl    Creatinine 7.6 (H) 0.6 - 1.3 mg/dl    GFR est AA 7.0      GFR est non-AA 6      Calcium 7.3 (L) 8.5 - 10.1 mg/dl    Anion gap 16 (H) 5 - 15 mmol/L   POC CHEM8    Collection Time: 09/08/17  1:56 PM   Result Value Ref Range    Sodium 129 (L) 136 - 145 mEq/L    Potassium 7.8 (HH) 3.5 - 4.9 mEq/L    Chloride 103 98 - 107 mEq/L     CO2, TOTAL 13 (LL) 21 - 32 mmol/L    Glucose 350 (H) 74 - 106 mg/dL    BUN 62 (H) 7 - 25 mg/dl    Creatinine 7.4 (H) 0.6 - 1.3 mg/dl    HCT 32 (L) 38 - 45 %    HGB 10.9 (L) 12.4 - 17.2 gm/dl    CALCIUM,IONIZED 3.80 (L) 4.40 - 5.40 mg/dL   BLOOD GAS, ARTERIAL    Collection Time: 09/08/17  2:11 PM   Result Value Ref Range    pH 7.009 (LL) 7.350 - 7.450      PCO2 48.1 (H) 35.0 - 45.0 mm Hg    PO2 139.7 (H) 75 - 100 mm Hg    BICARBONATE 12.2 (LL) 18.0 - 26.0 mmol/L    O2 SAT 97.4 90 - 100 %    CO2, TOTAL 13.7 (LL) 24 - 29 mmol/L    BASE EXCESS -18 (L) -2 - 3 mmol/L    Patient temp. 97.8      Sample type ARTERIAL      FIO2 55      SITE R [place]      DEVICE Adult Vent      ALLENS TEST Positive      Performed by [id number]      Respiratory Rate 12      T VTE 534      PEEP/CPAP 5      Total resp. rate 12      Critical action  Critical action: Notify physician      Critical note  Critical notify: Rhoda Waldvogel M      READ BACK Read back:  Yes      NOTIFY DATE  Notify date: 09/08/2017      NOTIFY TIME  Notify time: 14:14:04     GLUCOSE, POC    Collection Time: 09/08/17  2:35 PM   Result Value Ref Range    Glucose (POC) 372 (H) 65 - 105 mg/dL           No results for input(s): FIO2I, IFO2, HCO3I, IHCO3, HCOPOC, PCO2I, PCOPOC, IPHI, PHI, PHPOC, PO2I, PO2POC in the last 72 hours.    No lab exists for component: IPOC2    All Micro Results     Procedure Component Value Units Date/Time    L. PNEUMOPHILA [place], [place] [[id number]]     Order Status:  No result Specimen:  Urine     S.Judie Petit, UR/CSF [[id number]]     Order Status:  No result Specimen:  Other     CULTURE, BLOOD [[id number]] Collected:  09/08/17 1115    Order Status:  Completed Specimen:  Blood Updated:  09/08/17 1152    CULTURE, BLOOD [[id number]] Collected:  09/08/17 1115    Order Status:  Completed Specimen:  Blood Updated:  09/08/17 1152    CULTURE, URINE [[id number]] Collected:  09/08/17 0915    Order Status:  Completed Specimen:  Urine Updated:  09/08/17 1009              Imaging:   [x] I have personally reviewed the patient???s chest radiographs images and report with the patient  Results from Hospital Encounter encounter on 09/08/17   XR CHEST SNGL V    Narrative INDICATION:  Post intubation       EXAMINATION:  XR CHEST SNGL V    COMPARISON:  09/08/2017    FINDINGS:  The study shows a normal sized heart.. Increased opacity at the right lower lung  field.. Known left-sided lung nodule. Also known multiple bullae. Endotracheal  tube in good position.      Impression IMPRESSION:  1. Developing right lower lobe infiltrate. Endotracheal tube in good position.         Results from [place] encounter on 09/08/17   CT HEAD WO CONT    Narrative INDICATION:  Altered mental status.        COMPARISON:  None    TECHNIQUE:  The CT scan of the head was performed without contrast. Coronal and sagittal  reconstructions were obtained.  DICOM format image data is available to non-affiliated external healthcare  facilities or entities on a secure, media free, reciprocally searchable basis  with patient authorization for 12 months following the date of the study.    FINDINGS:  The ventricular system shows normal dimensions and morphology. The white and  gray matter is well differentiated. Periventricular confluent white matter  hypoattenuation. Rounded 11.6 mm hypodensity in the left posterior fossa  adjacent to the left petrous bone and the tentorium suggesting meningioma. This  is unchanged. There is no intracranial bleed or evidence of an acute infarct.   No identification of a fracture. Mild ethmoid sinusitis.             Impression IMPRESSION:   1. Chronic microvascular white matter ischemic disease.  2. Small unchanged rounded hyperdense lesion in the left posterior fossa  adjacent to the petrous bone and tentorium likely meningioma.  3. Mild ethmoid sinusitis.                All Micro Results     Procedure Component Value Units Date/Time    L.  PNEUMOPHILA [place], [place] [[id number]]      Order Status:  No result Specimen:  Urine     S.Judie Petit, UR/CSF [[id number]]     Order Status:  No result Specimen:  Other     CULTURE, BLOOD [[id number]] Collected:  09/08/17 1115    Order Status:  Completed Specimen:  Blood Updated:  09/08/17 1152    CULTURE, BLOOD [[id number]] Collected:  09/08/17 1115    Order Status:  Completed Specimen:  Blood Updated:  09/08/17 1152    CULTURE, URINE [[id number]] Collected:  09/08/17 0915    Order Status:  Completed Specimen:  Urine Updated:  09/08/17 1009          [x] See my orders for details    My assessment, plan of care, findings, medications, side effects etc were discussed with:  [x] nursing [] PT/OT    [x] respiratory therapy [x] Dr.Camarato, MD   [] family [] Patient     [x] Total care time exclusive of procedures 38 minutes with complex decision making performed and > 50% time spent in face to face consultation.    Donavan Foil, MD

## 2017-09-08 NOTE — ED Triage Notes (Signed)
Pt arrived via EMS for unresponsive. Husband on scene states she takes Hydrocodone for her chronic back pain and may have taken too many. Pupils were pinpoint. EMS gave 4 mg Narcan, 125 mg Solumedrol, Albuterol, Albuterol and Atrovent enroute. Pt responding to voice stimuli.

## 2017-09-08 NOTE — Progress Notes (Addendum)
1430: TRANSFER - IN REPORT:    Verbal report received from Maribel(name) on [name] [name]  being received from ER(unit) for routine progression of care      Report consisted of patient???s Situation, Background, Assessment and   Recommendations(SBAR).     Information from the following report(s) ED Summary was reviewed with the receiving nurse.    Opportunity for questions and clarification was provided.      Assessment completed upon patient???s arrival to unit and care assumed.     Pt is unresponsive, IVF started per order    0932: critical lab: K 7.8; CO2 14. Dr. Jerilynn Som informed orders obtained, D50 25g, insulin 10 units, kayexalate, calcium gluconate administered, 1L NS started. Levophed initiated.   PICC RN at the bedside.    1600: Dr.Deschamplein is in to place uldall    Pt is following commands, propofol started  1726: called Dr.Zafar, informed about K 7.9, will start dialysis stat, dialysis nurse at the bedside  1900: Bedside shift change report given to Caryl Pina and Kennyth Lose,  (oncoming nurse) by Regenia Skeeter (offgoing nurse). Report included the following information SBAR, Kardex, Intake/Output, Recent Results and Cardiac Rhythm ST.   Shift events    Patient Vitals for the past 12 hrs:   Temp Pulse Resp BP SpO2   09/08/17 1917 96.8 ??F (36 ??C) (!) 111 16 107/78 100 %   09/08/17 1901 96.4 ??F (35.8 ??C) 99 18 111/66 100 %   09/08/17 1847 96.2 ??F (35.7 ??C) 86 18 95/61 98 %   09/08/17 1831 96.2 ??F (35.7 ??C) 75 18 95/54 98 %   09/08/17 1817 96.2 ??F (35.7 ??C) 73 18 98/57 97 %   09/08/17 1802 96 ??F (35.6 ??C) 73 18 104/49 97 %   09/08/17 1746 96 ??F (35.6 ??C) 76 18 104/50 98 %   09/08/17 1743 96 ??F (35.6 ??C) 76 18 108/47 99 %   09/08/17 1740 96 ??F (35.6 ??C) 77 18 ??? 98 %   09/08/17 1731 95.9 ??F (35.5 ??C) 79 18 109/51 99 %   09/08/17 1716 95.9 ??F (35.5 ??C) 80 18 98/52 97 %   09/08/17 1605 ??? 94 18 ??? 100 %   09/08/17 1601 95.9 ??F (35.5 ??C) 94 18 96/48 100 %   09/08/17 1433 ??? 98 18 ??? 100 %    09/08/17 1430 96 ??F (35.6 ??C) (!) 101 20 (!) 88/56 100 %   09/08/17 1354 ??? 90 12 ??? 99 %   09/08/17 1332 ??? 91 12 101/61 100 %   09/08/17 1316 ??? 95 12 104/66 100 %   09/08/17 1302 ??? 89 12 110/68 100 %   09/08/17 1246 ??? 94 12 92/58 100 %   09/08/17 1232 ??? (!) 102 15 90/54 100 %   09/08/17 1216 ??? (!) 101 15 (!) 87/56 100 %   09/08/17 1201 ??? (!) 102 14 (!) 87/53 100 %   09/08/17 1133 ??? (!) 106 15 90/56 100 %   09/08/17 1116 ??? (!) 105 15 92/53 97 %   09/08/17 1109 ??? 99 ??? 91/49 ???   09/08/17 1107 ??? 100 ??? 91/49 ???   09/08/17 1101 ??? (!) 101 15 91/49 97 %   09/08/17 1046 ??? (!) 101 15 92/56 99 %   09/08/17 1045 96.6 ??F (35.9 ??C) ??? ??? ??? ???   09/08/17 1033 ??? 100 16 (!) 87/56 97 %   09/08/17 0946 ??? (!) 102 17 91/59 99 %   09/08/17 0931 ??? (!)  103 18 99/68 97 %   09/08/17 0916 ??? 98 17 98/57 98 %   09/08/17 0902 ??? 92 19 104/66 99 %   09/08/17 0844 ??? 99 20 130/64 97 %   09/08/17 0842 ??? ??? ??? ??? 98 %         Intake/Output Summary (Last 24 hours) at 09/08/2017 1923  Last data filed at 09/08/2017 1821  Gross per 24 hour   Intake 1825.4 ml   Output 160 ml   Net 1665.4 ml            Recent Results (from the past 12 hour(s))   POC CHEM8    Collection Time: 09/08/17  9:13 AM   Result Value Ref Range    Sodium 126 (L) 136 - 145 mEq/L    Potassium 8.6 (HH) 3.5 - 4.9 mEq/L    Chloride 96 (L) 98 - 107 mEq/L    CO2, TOTAL 20 (L) 21 - 32 mmol/L    Glucose 288 (H) 74 - 106 mg/dL    BUN 77 (H) 7 - 25 mg/dl    Creatinine 7.6 (H) 0.6 - 1.3 mg/dl    HCT 35 (L) 38 - 45 %    HGB 11.9 (L) 12.4 - 17.2 gm/dl    CALCIUM,IONIZED 4.20 (L) 4.40 - 5.40 mg/dL   POC URINE MACROSCOPIC    Collection Time: 09/08/17  9:22 AM   Result Value Ref Range    Glucose 100 (A) NEGATIVE,Negative mg/dl    Bilirubin Negative NEGATIVE,Negative      Ketone Negative NEGATIVE,Negative mg/dl    Specific gravity 1.020 1.005 - 1.030      Blood Small (A) NEGATIVE,Negative      pH (UA) 5.5 5 - 9      Protein 100 (A) NEGATIVE,Negative mg/dl    Urobilinogen 0.2 0.0 - 1.0 EU/dl     Nitrites Negative NEGATIVE,Negative      Leukocyte Esterase Moderate (A) NEGATIVE,Negative      Color Yellow      Appearance Clear     POC URINE MICROSCOPIC    Collection Time: 09/08/17  9:22 AM   Result Value Ref Range    Epithelial cells, squamous 1-4 /LPF    Transitional epithelial cells OCCASIONAL /LPF    WBC 15-29 /HPF    Bacteria 4+ /HPF   DRUG SCREEN, URINE    Collection Time: 09/08/17  9:23 AM   Result Value Ref Range    Amphetamine NEGATIVE NEGATIVE      Barbiturates NEGATIVE NEGATIVE      Benzodiazepines NEGATIVE NEGATIVE      Cocaine NEGATIVE NEGATIVE      Marijuana NEGATIVE NEGATIVE      Methadone NEGATIVE NEGATIVE      Opiates NEGATIVE NEGATIVE      Phencyclidine NEGATIVE NEGATIVE     CBC WITH AUTOMATED DIFF    Collection Time: 09/08/17  9:30 AM   Result Value Ref Range    WBC 15.1 (H) 4.0 - 11.0 1000/mm3    RBC 3.67 3.60 - 5.20 M/uL    HGB 10.8 (L) 13.0 - 17.2 gm/dl    HCT 32.9 (L) 37.0 - 50.0 %    MCV 89.6 80.0 - 98.0 fL    MCH 29.4 25.4 - 34.6 pg    MCHC 32.8 30.0 - 36.0 gm/dl    PLATELET 212 140 - 450 1000/mm3    MPV 11.0 (H) 6.0 - 10.0 fL    RDW-SD 44.0 36.4 - 46.3  NRBC 0 0 - 0      IMMATURE GRANULOCYTES 0.6 0.0 - 3.0 %    NEUTROPHILS 92.1 (H) 34 - 64 %    LYMPHOCYTES 4.6 (L) 28 - 48 %    MONOCYTES 2.6 1 - 13 %    EOSINOPHILS 0.0 0 - 5 %    BASOPHILS 0.1 0 - 3 %   LACTIC ACID    Collection Time: 09/08/17  9:30 AM   Result Value Ref Range    Lactic Acid 10.6 (HH) 0.4 - 2.0 mmol/L   MAGNESIUM    Collection Time: 09/08/17  9:30 AM   Result Value Ref Range    Magnesium 2.4 1.6 - 2.6 mg/dl   TSH 3RD GENERATION    Collection Time: 09/08/17  9:30 AM   Result Value Ref Range    TSH 0.506 0.358 - 3.740 uIU/mL   ETHYL ALCOHOL    Collection Time: 09/08/17  9:30 AM   Result Value Ref Range    ALCOHOL(ETHYL),SERUM NEGATIVE 0.0 - 9.0 mg/dl   ACETAMINOPHEN    Collection Time: 09/08/17  9:30 AM   Result Value Ref Range    Acetaminophen level <2.0 (L) 10.0 - 30.1 mcg/ml   SALICYLATE     Collection Time: 09/08/17  9:30 AM   Result Value Ref Range    Salicylate 1.8 (L) 2.8 - 60.1 mg/dl   METABOLIC PANEL, BASIC    Collection Time: 09/08/17  9:30 AM   Result Value Ref Range    Sodium 127 (L) 136 - 145 mEq/L    Potassium 8.0 (HH) 3.5 - 5.1 mEq/L    Chloride 92 (L) 98 - 107 mEq/L    CO2 15 (L) 21 - 32 mEq/L    Glucose 291 (H) 74 - 106 mg/dl    BUN 67 (H) 7 - 25 mg/dl    Creatinine 7.6 (H) 0.6 - 1.3 mg/dl    GFR est AA 7.0      GFR est non-AA 6      Calcium 8.0 (L) 8.5 - 10.1 mg/dl    Anion gap 19 (H) 5 - 15 mmol/L   HEPATIC FUNCTION PANEL    Collection Time: 09/08/17  9:30 AM   Result Value Ref Range    AST (SGOT) 12 (L) 15 - 37 U/L    ALT (SGPT) 16 12 - 78 U/L    Alk. phosphatase 69 45 - 117 U/L    Bilirubin, total 0.3 0.2 - 1.0 mg/dl    Protein, total 6.8 6.4 - 8.2 gm/dl    Albumin 3.1 (L) 3.4 - 5.0 gm/dl    Bilirubin, direct 0.1 0.0 - 0.2 mg/dl   PROTHROMBIN TIME + INR    Collection Time: 09/08/17  9:30 AM   Result Value Ref Range    Prothrombin time 13.2 (H) 10.2 - 12.9 seconds    INR 1.1 0.1 - 1.1     TROPONIN I    Collection Time: 09/08/17  9:30 AM   Result Value Ref Range    Troponin-I <0.015 0.000 - 0.045 ng/ml   CKMB PROFILE    Collection Time: 09/08/17  9:30 AM   Result Value Ref Range    CK 165 26 - 192 U/L    CK - MB 0.7 0.0 - 3.6 ng/ml    CK-MB Index 0.4 0.0 - 4.9 %   AMMONIA    Collection Time: 09/08/17  9:30 AM   Result Value Ref Range    Ammonia 31 25 -  32 umol/L   NT-PRO BNP    Collection Time: 09/08/17  9:30 AM   Result Value Ref Range    NT pro-BNP 2,810.0 (H) 0.0 - 125.0 pg/ml   EKG, 12 LEAD, INITIAL    Collection Time: 09/08/17 10:38 AM   Result Value Ref Range    Ventricular Rate 103 BPM    Atrial Rate 103 BPM    P-R Interval 202 ms    QRS Duration 94 ms    Q-T Interval 340 ms    QTC Calculation (Bezet) 445 ms    Calculated P Axis 79 degrees    Calculated R Axis 8 degrees    Calculated T Axis 77 degrees    Diagnosis       Sinus tachycardia  Otherwise normal ECG   When compared with ECG of 06-Aug-2017 20:27,  QRS duration has increased  T wave amplitude has increased in Anterior leads  Confirmed by Mayford Knife, M.D., Mohit ([id number]) on 09/08/2017 3:16:40 PM     BLOOD GAS, ARTERIAL    Collection Time: 09/08/17 10:51 AM   Result Value Ref Range    pH 7.070 (LL) 7.350 - 7.450      PCO2 46.5 (H) 35.0 - 45.0 mm Hg    PO2 89.1 75 - 100 mm Hg    BICARBONATE 13.7 (LL) 18.0 - 26.0 mmol/L    O2 SAT 93.0 90 - 100 %    CO2, TOTAL 15.2 (L) 24 - 29 mmol/L    BASE EXCESS -16 (L) -2 - 3 mmol/L    Patient temp. 96.8      Sample type ARTERIAL      SITE 65      ALLENS TEST Positive      Performed by [id number]      Critical action  Critical action: Notify physician      Critical note  Critical notify: Dr. Meda Coffee      READ BACK Read back: Yes      NOTIFY DATE  Notify date: 09/08/2017      NOTIFY TIME  Notify time: 10:55:59     GLUCOSE, POC    Collection Time: 09/08/17 12:48 PM   Result Value Ref Range    Glucose (POC) 350 (H) 65 - 105 mg/dL   PROCALCITONIN    Collection Time: 09/08/17  1:55 PM   Result Value Ref Range    PROCALCITONIN 0.38 0.00 - 0.50 ng/ml   POTASSIUM    Collection Time: 09/08/17  1:55 PM   Result Value Ref Range    Potassium 6.4 (HH) 3.5 - 5.1 mEq/L   LIPASE    Collection Time: 09/08/17  1:55 PM   Result Value Ref Range    Lipase 88 73 - 393 U/L   OSMOLALITY, SERUM/PLASMA    Collection Time: 09/08/17  1:55 PM   Result Value Ref Range    Osmolality, serum/plasma 320 (H) 280 - 301 mOsm/kg   METABOLIC PANEL, BASIC    Collection Time: 09/08/17  1:55 PM   Result Value Ref Range    Sodium 131 (L) 136 - 145 mEq/L    Potassium 7.8 (HH) 3.5 - 5.1 mEq/L    Chloride 100 98 - 107 mEq/L    CO2 14 (LL) 21 - 32 mEq/L    Glucose 362 (H) 74 - 106 mg/dl    BUN 63 (H) 7 - 25 mg/dl    Creatinine 7.6 (H) 0.6 - 1.3 mg/dl    GFR est AA 7.0  GFR est non-AA 6      Calcium 7.3 (L) 8.5 - 10.1 mg/dl    Anion gap 16 (H) 5 - 15 mmol/L   HEP B SURFACE AB    Collection Time: 09/08/17  1:55 PM   Result Value Ref Range     Hepatitis B surface Ab NON-REACTIVE     HEP B SURFACE AG    Collection Time: 09/08/17  1:55 PM   Result Value Ref Range    Hepatitis B surface Ag NON-REACTIVE NON-REACTIVE     POC CHEM8    Collection Time: 09/08/17  1:56 PM   Result Value Ref Range    Sodium 129 (L) 136 - 145 mEq/L    Potassium 7.8 (HH) 3.5 - 4.9 mEq/L    Chloride 103 98 - 107 mEq/L    CO2, TOTAL 13 (LL) 21 - 32 mmol/L    Glucose 350 (H) 74 - 106 mg/dL    BUN 62 (H) 7 - 25 mg/dl    Creatinine 7.4 (H) 0.6 - 1.3 mg/dl    HCT 32 (L) 38 - 45 %    HGB 10.9 (L) 12.4 - 17.2 gm/dl    CALCIUM,IONIZED 3.80 (L) 4.40 - 5.40 mg/dL   BLOOD GAS, ARTERIAL    Collection Time: 09/08/17  2:11 PM   Result Value Ref Range    pH 7.009 (LL) 7.350 - 7.450      PCO2 48.1 (H) 35.0 - 45.0 mm Hg    PO2 139.7 (H) 75 - 100 mm Hg    BICARBONATE 12.2 (LL) 18.0 - 26.0 mmol/L    O2 SAT 97.4 90 - 100 %    CO2, TOTAL 13.7 (LL) 24 - 29 mmol/L    BASE EXCESS -18 (L) -2 - 3 mmol/L    Patient temp. 97.8      Sample type ARTERIAL      FIO2 55      SITE R Brach      DEVICE Adult Vent      ALLENS TEST Positive      Performed by [id number]      Respiratory Rate 12      T VTE 534      PEEP/CPAP 5      Total resp. rate 12      Critical action  Critical action: Notify physician      Critical note  Critical notify: PATEL M      READ BACK Read back: Yes      NOTIFY DATE  Notify date: 09/08/2017      NOTIFY TIME  Notify time: 14:14:04     GLUCOSE, POC    Collection Time: 09/08/17  2:35 PM   Result Value Ref Range    Glucose (POC) 372 (H) 65 - 105 mg/dL   LACTIC ACID    Collection Time: 09/08/17  2:54 PM   Result Value Ref Range    Lactic Acid 11.0 (HH) 0.4 - 2.0 mmol/L   POTASSIUM    Collection Time: 09/08/17  4:20 PM   Result Value Ref Range    Potassium 7.9 (HH) 3.5 - 5.1 mEq/L   METABOLIC PANEL, BASIC    Collection Time: 09/08/17  4:20 PM   Result Value Ref Range    Sodium 130 (L) 136 - 145 mEq/L    Potassium 7.9 (HH) 3.5 - 5.1 mEq/L    Chloride 100 98 - 107 mEq/L    CO2 13 (LL) 21 - 32 mEq/L     Glucose  427 (HH) 74 - 106 mg/dl    BUN 65 (H) 7 - 25 mg/dl    Creatinine 7.2 (H) 0.6 - 1.3 mg/dl    GFR est AA 7.0      GFR est non-AA 6      Calcium 7.5 (L) 8.5 - 10.1 mg/dl    Anion gap 17 (H) 5 - 15 mmol/L   INFLUENZA A/B, BY PCR    Collection Time: 09/08/17  5:20 PM   Result Value Ref Range    Influenza A PCR NEGATIVE NEGATIVE      Influenza B PCR NEGATIVE NEGATIVE     LEGIONELLA PNEUMOPHILA AG, URINE    Collection Time: 09/08/17  5:25 PM   Result Value Ref Range    Legionella Ag, urine NEGATIVE NEGATIVE     S.PNEUMO AG, UR/CSF    Collection Time: 09/08/17  5:25 PM   Result Value Ref Range    Strep pneumo Ag, urine NEGATIVE NEGATIVE     GLUCOSE, POC    Collection Time: 09/08/17  6:17 PM   Result Value Ref Range    Glucose (POC) 300 (H) 65 - 105 mg/dL       Output by Drain (mL) 09/06/17 0701 - 09/06/17 1900 09/06/17 1901 - 09/07/17 0700 09/07/17 0701 - 09/07/17 1900 09/07/17 1901 - 09/08/17 0700 09/08/17 0701 - 09/08/17 1900 09/08/17 1901 - 09/08/17 1923   Patient has no LDAs of requested type attached.

## 2017-09-08 NOTE — H&P (Signed)
History & Physical    09/08/2017  Primary Care Provider: Wallene Huh, PA-C  Source of Information: Patient's spouse    Chief complaint: Obtuned, AKI, hyperkalemia        Assessment:    Active Problems:    Hyperkalemia (09/08/2017)      Obtunded (09/08/2017)      Acute renal failure (ARF) ([place]) (09/08/2017)      Septic shock ([place]) (07/15/5425)      Metabolic encephalopathy (0/62/3762)        Impression:   This 67 year old African female with past no history of chronic pain, hypertension, hepatitis C, GERD, COPD, diabetes, marijuana use who presents with altered mental status, with impending intubation due to altered mentation with reports of nausea and vomiting.  Also acute kidney injury/acute renal failure with hyperkalemia and metabolic acidosis.  Hypotension, rule out sepsis with possible UTI.  Encephalopathy which could be due to sepsis versus medication overdose.      Plan:    1. Likely Sepsis: Continue broad-spectrum robotics with Zosyn and vancomycin at renal dosing.  Blood and urine cultures have been ordered, lactate of 10.6, check pro-calcitonin, although this may be limited due to renal failure.  IV fluids as below.  Trend lactate.  At sputum culture, urine antigens.  Levophed as needed for pressor support.    2.  Metabolic encephalopathy: Possibly due to #1 versus medication overuse.  Possibility of narcotic overdose was reported from early nursing reports in ED.  However per husband it is not clear that she is on narcotics.  It sounds like she is not improved with doses of Narcan given in the emergency department.  Ammonia level and TSH appear normal.  No acute findings reported on head CT.  UDS is positive only for marijuana.  Serum alcohol level negative, negative acetaminophen level, salicylate level is not elevated.      3.  Intubation for airway protection: E plans intubated shortly due to concerns about airway protection in the setting of reported nausea and  vomiting.  Pulmonary/critical care medicine has been consulted.    4.  Hyperkalemia: Patient is been given IV insulin as well as calcium, bicarbonate in the emergency department.  We will plan to begin IV fluids as below.  We will also plan to give Kayexalate once intubated and OG tube in place.  Trend potassium, nephrology is also been consulted.    5.  Acute renal failure: Baseline BUN and creatinine of 16 and 0.9 on February 27 of this year, there presented at that time with mild AK I with a creatinine of 1.8.  We will hold most home meds including her antihypertensives particularly her Cozaar, Aldactone.  Will begin IV fluids with bicarbonate 141mq in D5 for metabolic acidosis.  Follow-up nephrology recommendations. Strict I/O.  Monitor electrolytes.    6. UTI: Possible source for #1, mild findings on urinalysis here with moderate LE and some white blood cells.  Follow urine culture.  Antibiotics as above.    7.  COPD: We will plan to continue nebulizations, Singulair, and substitute Brio for home Advair.  We will hold off on steroids at this point.  Pulmonary consult as above    8. GERD: Add IV Protonix for GI prophylaxis.    9.  Chronic pain: Hold other medications including her home Seroquel, nabumetone.      10. Hep C: Chronic    11. N/V: unsure how exactly related to AKI. Check KUB.      PPX: Heparin TID  Code status: Full per discussion with spouse.      History of Presenting Illness:  [name] [name] is a 67 y.o. AA female with past medical history of chronic pain related to the right hip, COPD, diabetes, hypertension, hepatitis C, GERD, pulmonary nodules, PTSD, and marijuana use, who presents with altered mentation, acute kidney injury, and hyperkalemia.  The patient is able to tell me her name and briefly follow some commands but is plan for intubation due to airway protection. She is unable to provide further history.  Most of her history is provided by her spouse at bedside.  She  reports she was in her usual state of health prior to yesterday but yesterday began to develop increased vomiting and nausea.  He described a producing mostly foamy white emesis.  This morning he found her having fallen out of bed.  He reports she has chronic pain related to the right hip for which she has had surgery in the past about 2 years ago.  He reports that she may have overused pain medication with reporting that she appears to be on Nabumetone. Her husband also reports some foul-smelling urine. In the emergency department she was found to have a potassium level of 8, sodium level 127, BUN and creatinine of 67 and 7.6, which was normal about 1 month ago.  Also leukocytosis.   She has been started on treatment for sepsis as well as hyperkalemia and is requested for ICU admission.,     PMHX:  Past Medical History:   Diagnosis Date   ??? Arthritis    ??? 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 COPD (chronic obstructive pulmonary disease) ([place])    ??? DM2 (diabetes mellitus, type 2) ([place])    ??? GERD (gastroesophageal reflux disease)    ??? Hepatitis C     HEP C   ??? HTN (hypertension)    ??? Lung nodule, multiple     (CT 10/2016) Small left upper lobe and 1.7 x 1.6 cm left lower lobe nodules not significantly changed from 07/23/2016 and 03/22/2016   ??? Marijuana use    ??? PTSD (post-traumatic stress disorder)     lived through the [place] in 1993   ??? Thoracic ascending aortic aneurysm ([place])     4.4cm noted on CT Chest (10/2016)   ??? Thyroid nodule     2.5 cm stable right thyroid nodule (CT 10/2016)        PSHX:  Past Surgical History:   Procedure Laterality Date   ??? HX ENDOSCOPY     ??? HX HIP REPLACEMENT Right 01/29/2015   ??? HX HIP REPLACEMENT     ??? HX HYSTERECTOMY  2010    hysterectomy       MEDICATIONS: PER EHR, unable to confirm  Prior to Admission medications    Medication Sig Start Date End Date Taking? Authorizing Provider    spironolactone (ALDACTONE) 100 mg tablet TAKE 1 TABLET BY MOUTH DAILY 08/16/17   Caryl Bis, FNP   montelukast (SINGULAIR) 10 mg tablet TAKE 1 TABLET BY MOUTH DAILY 08/16/17   Wallene Huh, PA-C   nabumetone (RELAFEN) 750 mg tablet TAKE 1 TABLET BY MOUTH TWICE DAILY AS NEEDED FOR PAIN. USE SPARINGLY 08/14/17   Kemeni, Lavone Orn, FNP   cloNIDine HCl (CATAPRES) 0.3 mg tablet TAKE ONE TABLET BY MOUTH TWICE DAILY 08/14/17   Caryl Bis, FNP   magnesium oxide (MAG-OX) 400 mg tablet  TAKE 1 TABLET BY MOUTH DAILY 08/14/17   Caryl Bis, FNP   traZODone (DESYREL) 50 mg tablet Take 1 Tab by mouth nightly as needed for Sleep. 08/09/17   Einar Grad, MD   tiotropium bromide (SPIRIVA RESPIMAT) 2.5 mcg/actuation inhaler Take 1 Puff by inhalation daily. 08/10/17   Einar Grad, MD   albuterol (PROVENTIL HFA, VENTOLIN HFA, PROAIR HFA) 90 mcg/actuation inhaler Take 2 Puffs by inhalation every six (6) hours for 5 days, THEN 2 Puffs every four (4) hours as needed for Wheezing or Shortness of Breath for up to 30 days. 08/09/17 09/13/17  Einar Grad, MD   fluticasone-salmeterol (ADVAIR DISKUS) 100-50 mcg/dose diskus inhaler Take 1 Puff by inhalation two (2) times a day. 08/09/17   Einar Grad, MD   losartan (COZAAR) 100 mg tablet TAKE 1 TABLET BY MOUTH DAILY FOR HYPERTENSION 07/17/17   Lake Bells, MD   QUEtiapine SR (SEROQUEL XR) 150 mg sr tablet TAKE 1 TABLET BY MOUTH NIGHTLY 07/13/17   Lake Bells, MD   amLODIPine (NORVASC) 10 mg tablet TAKE ONE TABLET BY MOUTH ONCE DAILY 07/13/17   Lake Bells, MD   rosuvastatin (CRESTOR) 5 mg tablet TAKE 1 TABLET BY MOUTH NIGHTLY 07/13/17   Lake Bells, MD   metFORMIN ER (GLUCOPHAGE XR) 500 mg tablet 2 tabs BID 07/13/17   Tharakan, Sonnie Alamo, MD   albuterol (ACCUNEB) 1.25 mg/3 mL nebu 3 mL by Nebulization route every six (6) hours as needed. 06/18/17   Edger House, MD   polyethylene glycol (MIRALAX) 17 gram packet Take 1 Packet by mouth daily.  05/09/17   Lake Bells, MD   triamcinolone (NASACORT) 55 mcg nasal inhaler 2 Sprays by Both Nostrils route daily. 02/22/17   Wallene Huh, PA-C   benzonatate (TESSALON PERLES) 100 mg capsule Take 1 Cap by mouth three (3) times daily as needed for Cough. 01/09/17   Antoine Primas, PA-C   Blood-Glucose Meter monitoring kit Check sugars 2 times per day, fasting and 2 hours after dinner 07/15/15   Lake Bells, MD   Lancets misc Check sugars twice daily 07/15/15   Lake Bells, MD   glucose blood VI test strips (ASCENSIA AUTODISC VI, ONE TOUCH ULTRA TEST VI) strip Check sugars twice daily 07/15/15   Lake Bells, MD       ALLERGIES:  Allergies   Allergen Reactions   ??? Chocolate [Cocoa] Sneezing       FHX:  Family History   Problem Relation Age of Onset   ??? Hypertension Mother    ??? Other Mother         OSA   ??? Cancer Father         suspected leukemia per pt's spouse   ??? Cancer Maternal Aunt    ??? Alcohol abuse Maternal Grandfather         SHX:  Social History     Socioeconomic History   ??? Marital status: MARRIED     Spouse name: Not on file   ??? Number of children: Not on file   ??? Years of education: Not on file   ??? Highest education level: Not on file   Occupational History   ??? Not on file   Social Needs   ??? Financial resource strain: Not on file   ??? Food insecurity:     Worry: Not on file     Inability: Not on file   ??? Transportation needs:  Medical: Not on file     Non-medical: Not on file   Tobacco Use   ??? Smoking status: Former Smoker   ??? Smokeless tobacco: Never Used   ??? Tobacco comment: reported as quit smoking around 1990s per spouse   Substance and Sexual Activity   ??? Alcohol use: No   ??? Drug use: Yes     Types: Marijuana   ??? Sexual activity: Never   Lifestyle   ??? Physical activity:     Days per week: Not on file     Minutes per session: Not on file   ??? Stress: Not on file   Relationships   ??? Social connections:     Talks on phone: Not on file     Gets together: Not on file      Attends religious service: Not on file     Active member of club or organization: Not on file     Attends meetings of clubs or organizations: Not on file     Relationship status: Not on file   ??? Intimate partner violence:     Fear of current or ex partner: Not on file     Emotionally abused: Not on file     Physically abused: Not on file     Forced sexual activity: Not on file   Other Topics Concern   ??? Not on file   Social History Narrative   ??? Not on file         Review of Systems:  Unable to obtain due to AMS, Nausea, vomiting, foul smelling urine and chronic R hip pain reported by spouse.    Physical Exam:     Visit Vitals  BP 90/54   Pulse (!) 102   Temp 96.6 ??F (35.9 ??C)   Resp 15   Ht _0  (1.727 m)   Wt 90.7 kg (200 lb)   LMP  (Exact Date)   SpO2 100%   BMI 30.41 kg/m??    O2 Flow Rate (L/min): 4 l/min O2 Device: Nasal cannula    General: Somnolent, Obese AAF, orients to self, aware in a hospital, Follows commands to squeeze  Hands and wiggle toes.  HEENT: White sclera. Equal, 70m pupils, Oral Mucosa pink with upper dentures in place.  Lymph: No palpable anterior cervical or supraclavicular lympadenopathy  CV: Regular rate and rhythm, no murmurs, rubs, gallops  Pulm: Lungs clear to auscultation bilaterally, no focal wheezes, crackles, rhonchi  GI: Soft, nontender, obese and maybe softly distended.  GU: Foley catheter in place with only small amount of yellow urin.  Extremity: No clubbing, cyanosis, no pitting lower leg edema  Skin: Warm, dry  Neurologic: Somnolent, follows some simple commands at periphery as above.      24 Hour Results:  Recent Results (from the past 72 hour(s))   POC CHEM8    Collection Time: 09/08/17  9:13 AM   Result Value Ref Range    Sodium 126 (L) 136 - 145 mEq/L    Potassium 8.6 (HH) 3.5 - 4.9 mEq/L    Chloride 96 (L) 98 - 107 mEq/L    CO2, TOTAL 20 (L) 21 - 32 mmol/L    Glucose 288 (H) 74 - 106 mg/dL    BUN 77 (H) 7 - 25 mg/dl    Creatinine 7.6 (H) 0.6 - 1.3 mg/dl     HCT 35 (L) 38 - 45 %    HGB 11.9 (L) 12.4 - 17.2 gm/dl    CALCIUM,IONIZED  4.20 (L) 4.40 - 5.40 mg/dL   POC URINE MACROSCOPIC    Collection Time: 09/08/17  9:22 AM   Result Value Ref Range    Glucose 100 (A) NEGATIVE,Negative mg/dl    Bilirubin Negative NEGATIVE,Negative      Ketone Negative NEGATIVE,Negative mg/dl    Specific gravity 1.020 1.005 - 1.030      Blood Small (A) NEGATIVE,Negative      pH (UA) 5.5 5 - 9      Protein 100 (A) NEGATIVE,Negative mg/dl    Urobilinogen 0.2 0.0 - 1.0 EU/dl    Nitrites Negative NEGATIVE,Negative      Leukocyte Esterase Moderate (A) NEGATIVE,Negative      Color Yellow      Appearance Clear     POC URINE MICROSCOPIC    Collection Time: 09/08/17  9:22 AM   Result Value Ref Range    Epithelial cells, squamous 1-4 /LPF    Transitional epithelial cells OCCASIONAL /LPF    WBC 15-29 /HPF    Bacteria 4+ /HPF   DRUG SCREEN, URINE    Collection Time: 09/08/17  9:23 AM   Result Value Ref Range    Amphetamine NEGATIVE NEGATIVE      Barbiturates NEGATIVE NEGATIVE      Benzodiazepines NEGATIVE NEGATIVE      Cocaine NEGATIVE NEGATIVE      Marijuana NEGATIVE NEGATIVE      Methadone NEGATIVE NEGATIVE      Opiates NEGATIVE NEGATIVE      Phencyclidine NEGATIVE NEGATIVE     CBC WITH AUTOMATED DIFF    Collection Time: 09/08/17  9:30 AM   Result Value Ref Range    WBC 15.1 (H) 4.0 - 11.0 1000/mm3    RBC 3.67 3.60 - 5.20 M/uL    HGB 10.8 (L) 13.0 - 17.2 gm/dl    HCT 32.9 (L) 37.0 - 50.0 %    MCV 89.6 80.0 - 98.0 fL    MCH 29.4 25.4 - 34.6 pg    MCHC 32.8 30.0 - 36.0 gm/dl    PLATELET 212 140 - 450 1000/mm3    MPV 11.0 (H) 6.0 - 10.0 fL    RDW-SD 44.0 36.4 - 46.3      NRBC 0 0 - 0      IMMATURE GRANULOCYTES 0.6 0.0 - 3.0 %    NEUTROPHILS 92.1 (H) 34 - 64 %    LYMPHOCYTES 4.6 (L) 28 - 48 %    MONOCYTES 2.6 1 - 13 %    EOSINOPHILS 0.0 0 - 5 %    BASOPHILS 0.1 0 - 3 %   LACTIC ACID    Collection Time: 09/08/17  9:30 AM   Result Value Ref Range    Lactic Acid 10.6 (HH) 0.4 - 2.0 mmol/L   MAGNESIUM     Collection Time: 09/08/17  9:30 AM   Result Value Ref Range    Magnesium 2.4 1.6 - 2.6 mg/dl   TSH 3RD GENERATION    Collection Time: 09/08/17  9:30 AM   Result Value Ref Range    TSH 0.506 0.358 - 3.740 uIU/mL   ETHYL ALCOHOL    Collection Time: 09/08/17  9:30 AM   Result Value Ref Range    ALCOHOL(ETHYL),SERUM NEGATIVE 0.0 - 9.0 mg/dl   ACETAMINOPHEN    Collection Time: 09/08/17  9:30 AM   Result Value Ref Range    Acetaminophen level <2.0 (L) 10.0 - 16.1 mcg/ml   SALICYLATE    Collection Time: 09/08/17  9:30 AM  Result Value Ref Range    Salicylate 1.8 (L) 2.8 - 01.0 mg/dl   METABOLIC PANEL, BASIC    Collection Time: 09/08/17  9:30 AM   Result Value Ref Range    Sodium 127 (L) 136 - 145 mEq/L    Potassium 8.0 (HH) 3.5 - 5.1 mEq/L    Chloride 92 (L) 98 - 107 mEq/L    CO2 15 (L) 21 - 32 mEq/L    Glucose 291 (H) 74 - 106 mg/dl    BUN 67 (H) 7 - 25 mg/dl    Creatinine 7.6 (H) 0.6 - 1.3 mg/dl    GFR est AA 7.0      GFR est non-AA 6      Calcium 8.0 (L) 8.5 - 10.1 mg/dl    Anion gap 19 (H) 5 - 15 mmol/L   HEPATIC FUNCTION PANEL    Collection Time: 09/08/17  9:30 AM   Result Value Ref Range    AST (SGOT) 12 (L) 15 - 37 U/L    ALT (SGPT) 16 12 - 78 U/L    Alk. phosphatase 69 45 - 117 U/L    Bilirubin, total 0.3 0.2 - 1.0 mg/dl    Protein, total 6.8 6.4 - 8.2 gm/dl    Albumin 3.1 (L) 3.4 - 5.0 gm/dl    Bilirubin, direct 0.1 0.0 - 0.2 mg/dl   PROTHROMBIN TIME + INR    Collection Time: 09/08/17  9:30 AM   Result Value Ref Range    Prothrombin time 13.2 (H) 10.2 - 12.9 seconds    INR 1.1 0.1 - 1.1     TROPONIN I    Collection Time: 09/08/17  9:30 AM   Result Value Ref Range    Troponin-I <0.015 0.000 - 0.045 ng/ml   CKMB PROFILE    Collection Time: 09/08/17  9:30 AM   Result Value Ref Range    CK 165 26 - 192 U/L    CK - MB 0.7 0.0 - 3.6 ng/ml    CK-MB Index 0.4 0.0 - 4.9 %   AMMONIA    Collection Time: 09/08/17  9:30 AM   Result Value Ref Range    Ammonia 31 25 - 32 umol/L   BLOOD GAS, ARTERIAL     Collection Time: 09/08/17 10:51 AM   Result Value Ref Range    pH 7.070 (LL) 7.350 - 7.450      PCO2 46.5 (H) 35.0 - 45.0 mm Hg    PO2 89.1 75 - 100 mm Hg    BICARBONATE 13.7 (LL) 18.0 - 26.0 mmol/L    O2 SAT 93.0 90 - 100 %    CO2, TOTAL 15.2 (L) 24 - 29 mmol/L    BASE EXCESS -16 (L) -2 - 3 mmol/L    Patient temp. 96.8      Sample type ARTERIAL      SITE 65      ALLENS TEST Positive      Performed by [id number]      Critical action  Critical action: Notify physician      Critical note  Critical notify: Dr. Meda Coffee      READ BACK Read back: Yes      NOTIFY DATE  Notify date: 09/08/2017      NOTIFY TIME  Notify time: 10:55:59     GLUCOSE, POC    Collection Time: 09/08/17 12:48 PM   Result Value Ref Range    Glucose (POC) 350 (H) 65 - 105 mg/dL  Imaging:   CT Head without contrast  INDICATION:  Altered mental status.      ??  COMPARISON:  None  ??  TECHNIQUE:  The CT scan of the head was performed without contrast. Coronal and sagittal  reconstructions were obtained.  DICOM format image data is available to non-affiliated external healthcare  facilities or entities on a secure, media free, reciprocally searchable basis  with patient authorization for 12 months following the date of the study.  ??  FINDINGS:  The ventricular system shows normal dimensions and morphology. The white and  gray matter is well differentiated. Periventricular confluent white matter  hypoattenuation. Rounded 11.6 mm hypodensity in the left posterior fossa  adjacent to the left petrous bone and the tentorium suggesting meningioma. This  is unchanged. There is no intracranial bleed or evidence of an acute infarct.   No identification of a fracture. Mild ethmoid sinusitis.  ??  IMPRESSION  IMPRESSION:   1. Chronic microvascular white matter ischemic disease.  2. Small unchanged rounded hyperdense lesion in the left posterior fossa  adjacent to the petrous bone and tentorium likely meningioma.  3. Mild ethmoid sinusitis.      CXR single view   INDICATION:  Altered mental status, hypoxia, bibasilar Rales     ??  EXAMINATION:  XR CHEST SNGL V  ??  COMPARISON:  08/06/2017  ??  FINDINGS:  The study shows a normal sized heart.. Known bilateral lung bullae. Known and  stable 18 mm left lower lobe peripheral lung nodule.. No superimposed acute  process.  ??  IMPRESSION  IMPRESSION:  1. Known bullous emphysema.  2. Known stable 18 mm peripheral left lower lobe lung nodule.  3. No acute process.      Signed By: Marzella Schlein, MD    September 08, 2017    13:25

## 2017-09-08 NOTE — Procedures (Signed)
[facility], [facility]   Procedure Note    Date of Surgery: 09/08/2017  [facility] Edlin Ford  Assistant(s): Tera PartridgeMichelle L Omaira Mellen, MD  Pre-operative Diagnosis: Acute renal failure  Post-operative Diagnosis: Same as above  Procedure(s) Performed:  Non-tunneled dialysis catheter placement with ultrasound guidance  Anesthesia:  Local  Findings:  Catheter flushed and drew well at conclusion of the procedure.  Complications: None  Estimated Blood Loss:  Minimal  Tubes and Drains:  None  Specimens: * Cannot find log *    Procedure in Detail:   After informed consent was obtained, the patient was placed supine. A surgical time-out was performed. The patient was identified and the procedure verified.  The right leg was prepped with chlorhexidine and draped in a sterile manner.  1% Lidocaine was used to anesthetize the skin.  The right femoral vein was punctured under ultrasound guidance with an 18G needle. A J-wire was then placed into the iliac vein.   The skin of the thigh area was incised. Next, the needle was removed over the wire. 2 dilators were passed consecutively over the wire into the right femoral vein.. Next, the 20 cm catheter was inserted into the right femoral  over the wire. The wire was then removed and the pigtail port was closed. The catheter was found to aspirate blood easily. All ports were flushed with saline. The catheter was sutured to the skin withnylon.  Biopatch was applied to the catheter exit site. The surgical site was covered with a gauze and a Tegaderm.  The patient tolerated the procedure well without complications.       Tera PartridgeMichelle L Pape Parson, MD

## 2017-09-08 NOTE — Progress Notes (Signed)
Pt seen. Note to follow. Most likely HD stat for HyperK & Acidosis. Vasc informed for line.
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
## 2017-09-08 NOTE — Consults (Signed)
Consults by Tera Partridge, MD at 09/08/17 1647                Author: Tera Partridge, MD  Service: Vascular Surgery  Author Type: Physician       Filed: 09/08/17 1650  Date of Service: 09/08/17 1647  Status: Signed          Editor: Tayen Narang, Stann Mainland, MD (Physician)                    [facility]   [address]   [place], [place] [address]   Dr. Coralee Rud, Dr. Aldona Bar  &  Dr. Dionne Milo   [phone] FAX# [phone]                   Consult          Patient: [name] [name]  MRN: [id number]   SSN: [id number]          Date of Birth: [birth year]   Age: 67 y.o.   Sex: female           Subjective:         CHAZ RONNING is a 67 y.o. female who is being seen for acute renal failure. Found down and unresponsive. Husband fears she may have tekn too much pain medicine.  .        Past Medical History:        Diagnosis  Date         ?  Arthritis       ?  Chronic pain syndrome            related to R hip replacement         ?  COPD (chronic obstructive pulmonary disease) (HCC)       ?  DM2 (diabetes mellitus, type 2) (HCC)       ?  GERD (gastroesophageal reflux disease)       ?  Hepatitis C            HEP C         ?  HTN (hypertension)       ?  Lung nodule, multiple            (CT 10/2016) Small left upper lobe and 1.7 x 1.6 cm left lower lobe nodules not significantly changed from 07/23/2016 and 03/22/2016         ?  Marijuana use       ?  PTSD (post-traumatic stress disorder)            lived through the [place]         ?  Thoracic ascending aortic aneurysm (HCC)            4.4cm noted on CT Chest (10/2016)         ?  Thyroid nodule            2.5 cm stable right thyroid nodule (CT 10/2016)          Past Surgical History:         Procedure  Laterality  Date          ?  HX ENDOSCOPY         ?  HX HIP REPLACEMENT  Right  01/29/2015     ?  HX HIP REPLACEMENT         ?  HX HYSTERECTOMY  2010           hysterectomy           Family History         Problem  Relation  Age of Onset          ?  Hypertension  Mother       ?  Other  Mother                OSA          ?  Cancer  Father                suspected leukemia per pt's spouse          ?  Cancer  Maternal Aunt            ?  Alcohol abuse  Maternal Grandfather            Social History          Tobacco Use         ?  Smoking status:  Former Smoker     ?  Smokeless tobacco:  Never Used        ?  Tobacco comment: reported as quit smoking around 1990s per spouse       Substance Use Topics         ?  Alcohol use:  No           Current Facility-Administered Medications             Medication  Dose  Route  Frequency  Provider  Last Rate  Last Dose              ?  piperacillin-tazobactam (ZOSYN) 3.375 g in 0.9% sodium chloride (MBP/ADV) 100 mL MBP   3.375 g  IntraVENous  Q12H  Gwenyth Allegra, MD           ?  sodium chloride 0.9 % bolus infusion 1,000 mL   1,000 mL  IntraVENous  NOW  Gwenyth Allegra, MD     Stopped at 09/08/17 1100     ?  albuterol (PROVENTIL VENTOLIN) nebulizer solution 2.5 mg   2.5 mg  Nebulization  Q2H PRN  Gwenyth Allegra, MD           ?  albuterol-ipratropium (DUO-NEB) 2.5 MG-0.5 MG/3 ML   3 mL  Nebulization  Q6H PRN  Gwenyth Allegra, MD     3 mL at 09/08/17 1246     ?  dextrose (D50W) injection syrg 25 g   25 g  IntraVENous  PRN  Gwenyth Allegra, MD           ?  glucagon (GLUCAGEN) injection 1 mg   1 mg  IntraMUSCular  PRN  Gwenyth Allegra, MD                    ?  insulin lispro (HUMALOG) injection 1-100 Units   1-100 Units  SubCUTAneous  PRN  Gwenyth Allegra, MD                    ?  Melene Muller ON 09/09/2017] white petrolatum-mineral oil (GENTEAL PM) 85-15 % ophthalmic ointment 1 Each   1 Each  Both Eyes  DAILY  Gwenyth Allegra, MD           ?  midazolam in normal saline (VERSED) 1 mg/mL infusion   1-15 mg/hr  IntraVENous  TITRATE  Gwenyth Allegra, MD           ?  fentaNYL citrate (PF) injection 100-200 mcg   100-200 mcg  IntraVENous   Q1H PRN  Gwenyth Allegra, MD           ?  NOREPINephrine (LEVOPHED) 8 mg in 0.9% NS infusion   2-16 mcg/min  IntraVENous  TITRATE  Gwenyth Allegra, MD  46.9 mL/hr at 09/08/17 1625  25 mcg/min at 09/08/17 1625     ?  naloxone (NARCAN) injection 0.1 mg   0.1 mg  IntraVENous  PRN  Scarlette Ar, MD           ?  sodium bicarbonate (8.4%) 150 mEq in dextrose 5% 850 mL infusion     IntraVENous  CONTINUOUS  Zafar, Ather, MD  150 mL/hr at 09/08/17 1434        ?  acetaminophen (TYLENOL) tablet 650 mg   650 mg  Oral  Q4H PRN  Scarlette Ar, MD                Or              ?  acetaminophen (TYLENOL) solution 650 mg   650 mg  Oral  Q4H PRN  Scarlette Ar, MD                Or              ?  acetaminophen (TYLENOL) suppository 650 mg   650 mg  Rectal  Q4H PRN  Scarlette Ar, MD           ?  ondansetron (ZOFRAN) injection 4 mg   4 mg  IntraVENous  Q4H PRN  Scarlette Ar, MD           ?  albuterol-ipratropium (DUO-NEB) 2.5 MG-0.5 MG/3 ML   3 mL  Nebulization  Q6H RT  Scarlette Ar, MD     3 mL at 09/08/17 1400     ?  heparin (porcine) injection 5,000 Units   5,000 Units  SubCUTAneous  Q8H  Scarlette Ar, MD     5,000 Units at 09/08/17 1459     ?  dextrose (D50) infusion 5-25 g   10-50 mL  IntraVENous  PRN  Scarlette Ar, MD           ?  glucagon (GLUCAGEN) injection 1 mg   1 mg  IntraMUSCular  PRN  Scarlette Ar, MD           ?  insulin glargine (LANTUS) injection 1-100 Units   1-100 Units  SubCUTAneous  QHS  Scarlette Ar, MD                    ?  insulin lispro (HUMALOG) injection 1-100 Units   1-100 Units  SubCUTAneous  Q6H  Scarlette Ar, MD     3 Units at 09/08/17 1302              ?  albuterol-ipratropium (DUO-NEB) 2.5 MG-0.5 MG/3 ML   3 mL  Nebulization  Q4H PRN  Scarlette Ar, MD           ?  Melene Muller ON 09/09/2017] pantoprazole (PROTONIX) 40 mg in sodium chloride 0.9% 10 mL injection   40 mg  IntraVENous  DAILY  Scarlette Ar, MD           ?  propofol (DIPRIVAN) infusion    0-50 mcg/kg/min  IntraVENous  TITRATE  Verlan FriendsPatel, Manojkumar, MD  13.6 mL/hr at 09/08/17 1623  25 mcg/kg/min at 09/08/17 1623     ?  calcium gluconate 100 mg/mL (10%) injection                    ?  *vancomycin pharmacy dosing    1 Each  Other  Rx Dosing/Monitoring  Scarlette Aramarato, Joseph, MD           ?  calcium gluconate 2 g in 0.9% sodium chloride 100 mL IVPB   2 g  IntraVENous  ONCE  Zafar, Ather, MD           ?  insulin regular (NOVOLIN R, HUMULIN R) injection 10 Units   10 Units  IntraVENous  ONCE  Zafar, Ather, MD           ?  dextrose (D50W) injection syrg 25 g   50 mL  IntraVENous  ONCE  Zafar, Ather, MD           ?  vancomycin (VANCOCIN) 750 mg in 0.9% sodium chloride 250 mL IVPB   750 mg  IntraVENous  Janine OresNCE  Camarato, Joseph, MD           ?  Melene Muller[START ON 09/09/2017] heparin (porcine) pf 50 Units   50 Units  InterCATHeter  DAILY  Scarlette Aramarato, Joseph, MD           ?  heparin (porcine) pf 50 Units   50 Units  InterCATHeter  PRN  Scarlette Aramarato, Joseph, MD           ?  Melene Muller[START ON 09/09/2017] sodium chloride (NS) flush 10 mL   10 mL  InterCATHeter  DAILY  Scarlette Aramarato, Joseph, MD                    ?  sodium chloride (NS) flush 10 mL   10 mL  InterCATHeter  PRN  Scarlette Aramarato, Joseph, MD                    Allergies        Allergen  Reactions         ?  Chocolate [Cocoa]  Sneezing           Review of Systems:   Review of systems not obtained due to patient factors.        Objective:          Vitals:             09/08/17 1430  09/08/17 1433  09/08/17 1601  09/08/17 1605           BP:  (!) 88/56    96/48       Pulse:  (!) 101  98  94  94     Resp:  20  18  18  18      Temp:  96 ??F (35.6 ??C)    95.9 ??F (35.5 ??C)       SpO2:  100%  100%  100%  100%     Weight:                   Height:                    Physical Exam:   GENERAL: appears stated age   THROAT & NECK: normal and no erythema or exudates noted.    LUNG: rales R base, L base   HEART: regular rate and rhythm, S1, S2  normal, no murmur, click, rub or gallop   ABDOMEN: soft, non-tender. Bowel  sounds normal. No masses,  no organomegaly   EXTREMITIES:  extremities normal, atraumatic, no cyanosis or edema           Assessment:           Hospital Problems   Date Reviewed:  09/08/2017                         Codes  Class  Noted  POA              Hyperkalemia  ICD-10-CM: E87.5   ICD-9-CM: 276.7    09/08/2017  Unknown                        Obtunded  ICD-10-CM: R40.1   ICD-9-CM: 780.09    09/08/2017  Unknown                        Acute renal failure (ARF) (HCC)  ICD-10-CM: N17.9   ICD-9-CM: 584.9    09/08/2017  Unknown                        Septic shock (HCC)  ICD-10-CM: A41.9, R65.21   ICD-9-CM: 038.9, 785.52, 995.92    09/08/2017  Unknown                        Metabolic encephalopathy  ICD-10-CM: G93.41   ICD-9-CM: 348.31    09/08/2017  Unknown                            Plan:        [place] placement. Husband signed consent.          Signed By:  Tera Partridge, MD           September 08, 2017

## 2017-09-08 NOTE — Consults (Signed)
Consults  by Donavan Foil, MD at 09/08/17 1539                Author: Donavan Foil, MD  Service: Pulmonary Disease  Author Type: Physician       Filed: 09/08/17 1554  Date of Service: 09/08/17 1539  Status: Addendum          Editor: Donavan Foil, MD (Physician)          Related Notes: Original Note by Donavan Foil, MD (Physician) filed at 09/08/17 1553            Consult Orders        1. IP CONSULT TO PHYSICIAN [[id number]] ordered by Marzella Schlein, MD at 09/08/17 1259                                      [facility]          Pulmonary Consult Note      Patient: [name] [name]               Sex: female          DOA: 09/08/2017         Date of Birth:  03-Oct-1950      Age:  67 y.o.        LOS:  LOS: 0 days          Reason for Consult: Altered mental status and for airway protection requiring intubation and ventilator support/hypotension        IMPRESSION:     ??    Altered mental status and poor airway protection requiring intubation and ventilator support   ??  Hypotension hypovolemic versus septic shock   ??  Acute renal failure leading to severe hyperkalemia and metabolic acidosis   ??  Severe lactic acidosis   ??  Leukocytosis   ??  Chronic obstructive lung disease and  Stable pulmonary nodule (18 mm X 16 mm LLL)   ??  Hepatitis C   ??  Report of nausea and vomiting       RECOMMENDATIONS:     ??    Continue ventilator management as per protocol.  Titrate FiO2, and target oxygen saturation greater than 88%.   ??  Titrate Levophed, and target mean arterial pressure greater than 65 MMG   ??  Continue to fluid resuscitation.  Follow ins and outs.  Follow renal function.  Further management of acute kidney injury as per nephrology.   ??  Continue to monitor potassium level.  Status post D50, IV insulin, and calcium gluconate.   ??  Continue bicarb drip as recommended by nephrology for severe metabolic acidosis   ??  Follow serial lactic acid level until normalization    ??  Continue vancomycin and Zosyn for infection of unknown origin.  Consider flu PCR.  Follow culture results   ??  Follow renal ultrasonogram   ??  Consider subcu heparin for DVT prophylaxis   ??  Will defer respective systems problem management to primary and other consultant and follow patient with primary and other team   ??  Further recommendations will be based on the patient's response to recommended treatment and results of the investigation ordered.        History obtained from chart     HPI:     Ms. [name] [name] is a  67 y.o.  female who carries known history of chronic pain, hypertension, hepatitis C, GERD,  COPD, diabetes, marijuana use who presented to the [facility] ER via EMS with altered mental status requiring intubation for airway protection.  Patient on chart review it seems patient has had nausea and vomiting before she presented to the ER.  Lab analysis  demonstrated findings consistent with acute renal failure, metabolic acidosis and hyperkalemia.  Patient has remained hypotensive despite of fluid resuscitation currently requiring pressor.  Further history available at this time.      Review of Systems   Review of Systems    Unable to perform ROS: Intubated              Past Medical History     Past Medical History:        Diagnosis  Date         ?  Arthritis       ?  Chronic pain syndrome            related to R hip replacement         ?  COPD (chronic obstructive pulmonary disease) ([place])       ?  DM2 (diabetes mellitus, type 2) ([place])       ?  GERD (gastroesophageal reflux disease)       ?  Hepatitis C            HEP C         ?  HTN (hypertension)       ?  Lung nodule, multiple            (CT 10/2016) Small left upper lobe and 1.7 x 1.6 cm left lower lobe nodules not significantly changed from 07/23/2016 and 03/22/2016         ?  Marijuana use       ?  PTSD (post-traumatic stress disorder)            lived through the [place] in 1993         ?  Thoracic ascending aortic aneurysm ([place])             4.4cm noted on CT Chest (10/2016)         ?  Thyroid nodule            2.5 cm stable right thyroid nodule (CT 10/2016)           Past Surgical History     Past Surgical History:         Procedure  Laterality  Date          ?  HX ENDOSCOPY         ?  HX HIP REPLACEMENT  Right  01/29/2015     ?  HX HIP REPLACEMENT         ?  HX HYSTERECTOMY    2010          hysterectomy           Family History     Family History         Problem  Relation  Age of Onset          ?  Hypertension  Mother       ?  Other  Mother                OSA          ?  Cancer  Father  suspected leukemia per pt's spouse          ?  Cancer  Maternal Aunt            ?  Alcohol abuse  Maternal Grandfather             Social History     Social History          Tobacco Use         ?  Smoking status:  Former Smoker     ?  Smokeless tobacco:  Never Used        ?  Tobacco comment: reported as quit smoking around 1990s per spouse       Substance Use Topics         ?  Alcohol use:  No     ?  Drug use:  Yes              Types:  Marijuana            Medications     Current Facility-Administered Medications          Medication  Dose  Route  Frequency           ?  piperacillin-tazobactam (ZOSYN) 3.375 g in 0.9% sodium chloride (MBP/ADV) 100 mL MBP   3.375 g  IntraVENous  Q12H     ?  sodium chloride 0.9 % bolus infusion 1,000 mL   1,000 mL  IntraVENous  NOW     ?  albuterol (PROVENTIL VENTOLIN) nebulizer solution 2.5 mg   2.5 mg  Nebulization  Q2H PRN     ?  albuterol-ipratropium (DUO-NEB) 2.5 MG-0.5 MG/3 ML   3 mL  Nebulization  Q6H PRN     ?  dextrose (D50W) injection syrg 25 g   25 g  IntraVENous  PRN     ?  glucagon (GLUCAGEN) injection 1 mg   1 mg  IntraMUSCular  PRN     ?  insulin lispro (HUMALOG) injection 1-100 Units   1-100 Units  SubCUTAneous  PRN     ?  [START ON 09/09/2017] white petrolatum-mineral oil (GENTEAL PM) 85-15 % ophthalmic ointment 1 Each   1 Each  Both Eyes  DAILY     ?  midazolam in normal saline (VERSED) 1 mg/mL  infusion   1-15 mg/hr  IntraVENous  TITRATE           ?  fentaNYL citrate (PF) injection 100-200 mcg   100-200 mcg  IntraVENous  Q1H PRN           ?  NOREPINephrine (LEVOPHED) 8 mg in 0.9% NS 219m infusion   2-16 mcg/min  IntraVENous  TITRATE     ?  naloxone (NARCAN) injection 0.1 mg   0.1 mg  IntraVENous  PRN     ?  sodium bicarbonate (8.4%) 150 mEq in dextrose 5% 850 mL infusion     IntraVENous  CONTINUOUS     ?  acetaminophen (TYLENOL) tablet 650 mg   650 mg  Oral  Q4H PRN          Or           ?  acetaminophen (TYLENOL) solution 650 mg   650 mg  Oral  Q4H PRN          Or           ?  acetaminophen (TYLENOL) suppository 650 mg   650 mg  Rectal  Q4H PRN     ?  ondansetron (ZOFRAN) injection 4 mg   4 mg  IntraVENous  Q4H PRN     ?  albuterol-ipratropium (DUO-NEB) 2.5 MG-0.5 MG/3 ML   3 mL  Nebulization  Q6H RT     ?  heparin (porcine) injection 5,000 Units   5,000 Units  SubCUTAneous  Q8H     ?  dextrose (D50) infusion 5-25 g   10-50 mL  IntraVENous  PRN     ?  glucagon (GLUCAGEN) injection 1 mg   1 mg  IntraMUSCular  PRN     ?  insulin glargine (LANTUS) injection 1-100 Units   1-100 Units  SubCUTAneous  QHS     ?  insulin lispro (HUMALOG) injection 1-100 Units   1-100 Units  SubCUTAneous  Q6H     ?  albuterol-ipratropium (DUO-NEB) 2.5 MG-0.5 MG/3 ML   3 mL  Nebulization  Q4H PRN     ?  [START ON 09/09/2017] pantoprazole (PROTONIX) 40 mg in sodium chloride 0.9% 10 mL injection   40 mg  IntraVENous  DAILY     ?  propofol (DIPRIVAN) infusion   0-50 mcg/kg/min  IntraVENous  TITRATE     ?  propofol (DIPRIVAN) 10 mg/mL injection            ?  calcium gluconate 100 mg/mL (10%) injection            ?  *vancomycin pharmacy dosing    1 Each  Other  Rx Dosing/Monitoring     ?  calcium gluconate 2 g in 0.9% sodium chloride 100 mL IVPB   2 g  IntraVENous  ONCE     ?  insulin regular (NOVOLIN R, HUMULIN R) injection 10 Units   10 Units  IntraVENous  ONCE           ?  dextrose (D50W) injection syrg 25 g   50 mL  IntraVENous   ONCE          Prior to Admission medications             Medication  Sig  Start Date  End Date  Taking?  Authorizing Provider            spironolactone (ALDACTONE) 100 mg tablet  TAKE 1 TABLET BY MOUTH DAILY  08/16/17      Caryl Bis, FNP     montelukast (SINGULAIR) 10 mg tablet  TAKE 1 TABLET BY MOUTH DAILY  08/16/17      Wallene Huh, PA-C     nabumetone (RELAFEN) 750 mg tablet  TAKE 1 TABLET BY MOUTH TWICE DAILY AS NEEDED FOR PAIN. USE SPARINGLY  08/14/17      Kemeni, Lavone Orn, FNP     cloNIDine HCl (CATAPRES) 0.3 mg tablet  TAKE ONE TABLET BY MOUTH TWICE DAILY  08/14/17      Caryl Bis, FNP     magnesium oxide (MAG-OX) 400 mg tablet  TAKE 1 TABLET BY MOUTH DAILY  08/14/17      Caryl Bis, FNP     traZODone (DESYREL) 50 mg tablet  Take 1 Tab by mouth nightly as needed for Sleep.  08/09/17      Einar Grad, MD     tiotropium bromide (SPIRIVA RESPIMAT) 2.5 mcg/actuation inhaler  Take 1 Puff by inhalation daily.  08/10/17      Einar Grad, MD     albuterol (PROVENTIL HFA, VENTOLIN HFA, PROAIR HFA) 90 mcg/actuation inhaler  Take 2 Puffs by inhalation every six (6)  hours for 5 days, THEN 2 Puffs every four (4) hours as needed for Wheezing or Shortness of Breath for up  to 30 days.  08/09/17  09/13/17    Einar Grad, MD     fluticasone-salmeterol (ADVAIR DISKUS) 100-50 mcg/dose diskus inhaler  Take 1 Puff by inhalation two (2) times a day.  08/09/17      Einar Grad, MD     losartan (COZAAR) 100 mg tablet  TAKE 1 TABLET BY MOUTH DAILY FOR HYPERTENSION  07/17/17      Lake Bells, MD     QUEtiapine SR (SEROQUEL XR) 150 mg sr tablet  TAKE 1 TABLET BY MOUTH NIGHTLY  07/13/17      Lake Bells, MD     amLODIPine (NORVASC) 10 mg tablet  TAKE ONE TABLET BY MOUTH ONCE DAILY  07/13/17      Lake Bells, MD     rosuvastatin (CRESTOR) 5 mg tablet  TAKE 1 TABLET BY MOUTH NIGHTLY  07/13/17      Lake Bells, MD     metFORMIN ER (GLUCOPHAGE XR) 500 mg tablet  2 tabs BID  07/13/17      Tharakan, Sonnie Alamo,  MD            albuterol (ACCUNEB) 1.25 mg/3 mL nebu  3 mL by Nebulization route every six (6) hours as needed.  06/18/17      Edger House, MD            polyethylene glycol (MIRALAX) 17 gram packet  Take 1 Packet by mouth daily.  05/09/17      Lake Bells, MD     triamcinolone (NASACORT) 55 mcg nasal inhaler  2 Sprays by Both Nostrils route daily.  02/22/17      Wallene Huh, PA-C     benzonatate (TESSALON PERLES) 100 mg capsule  Take 1 Cap by mouth three (3) times daily as needed for Cough.  01/09/17      Antoine Primas, PA-C     Blood-Glucose Meter monitoring kit  Check sugars 2 times per day, fasting and 2 hours after dinner  07/15/15      Lake Bells, MD     Lancets misc  Check sugars twice daily  07/15/15      Lake Bells, MD            glucose blood VI test strips (ASCENSIA AUTODISC VI, ONE TOUCH ULTRA TEST VI) strip  Check sugars twice daily  07/15/15      Lake Bells, MD           Allergy     Allergies        Allergen  Reactions         ?  Chocolate [Cocoa]  Sneezing             Physical Exam:     Vital Signs:       Blood pressure (!) 88/56, pulse 98,  temperature 96 ??F (35.6 ??C), resp. rate 18, height '5\' 8"'$  (1.727 m), weight 90.7 kg (200 lb), SpO2 100 %. Body mass index is 30.41  kg/m??.      O2 Device: Ventilator     O2 Flow Rate (L/min): 4 l/min     Temp (24hrs), Avg:96.3 ??F (35.7 ??C), Min:96 ??F (35.6 ??C), Max:96.6 ??F (35.9 ??C)           Intake/Output:    Last shift:      03/29 0701 -  03/29 1900   In: 100 [I.V.:100]   Out: 10 [Urine:10]   Last 3 shifts: No intake/output data recorded.      Intake/Output Summary (Last 24 hours) at 09/08/2017 1539   Last data filed at 09/08/2017 1213     Gross per 24 hour        Intake  100 ml        Output  10 ml        Net  90 ml         Physical Exam    Constitutional: She appears well-developed.    HENT:    Head: Normocephalic and atraumatic.    Eyes: Pupils are equal, round, and reactive to light. EOM are normal.    Neck: Normal range of  motion. Neck supple.    Cardiovascular: Regular rhythm.    Pulmonary/Chest: Effort normal. She has no wheezes.    Abdominal: Soft.    Musculoskeletal: She exhibits no edema.   Neurological:    Did not respond to physical  stimulus    Skin: Skin is warm and dry.    Nursing note and vitals reviewed.               Labs Reviewed:     Recent Results (from the past 24 hour(s))     POC CHEM8          Collection Time: 09/08/17  9:13 AM         Result  Value  Ref Range            Sodium  126 (L)  136 - 145 mEq/L       Potassium  8.6 (HH)  3.5 - 4.9 mEq/L       Chloride  96 (L)  98 - 107 mEq/L       CO2, TOTAL  20 (L)  21 - 32 mmol/L       Glucose  288 (H)  74 - 106 mg/dL       BUN  77 (H)  7 - 25 mg/dl       Creatinine  7.6 (H)  0.6 - 1.3 mg/dl       HCT  35 (L)  38 - 45 %       HGB  11.9 (L)  12.4 - 17.2 gm/dl       CALCIUM,IONIZED  4.20 (L)  4.40 - 5.40 mg/dL       POC URINE MACROSCOPIC          Collection Time: 09/08/17  9:22 AM         Result  Value  Ref Range            Glucose  100 (A)  NEGATIVE,Negative mg/dl       Bilirubin  Negative  NEGATIVE,Negative         Ketone  Negative  NEGATIVE,Negative mg/dl       Specific gravity  1.020  1.005 - 1.030         Blood  Small (A)  NEGATIVE,Negative         pH (UA)  5.5  5 - 9         Protein  100 (A)  NEGATIVE,Negative mg/dl       Urobilinogen  0.2  0.0 - 1.0 EU/dl       Nitrites  Negative  NEGATIVE,Negative         Leukocyte Esterase  Moderate (A)  NEGATIVE,Negative  Color  Yellow          Appearance  Clear          POC URINE MICROSCOPIC          Collection Time: 09/08/17  9:22 AM         Result  Value  Ref Range            Epithelial cells, squamous  1-4  /LPF       Transitional epithelial cells  OCCASIONAL  /LPF       WBC  15-29  /HPF       Bacteria  4+  /HPF       DRUG SCREEN, URINE          Collection Time: 09/08/17  9:23 AM         Result  Value  Ref Range            Amphetamine  NEGATIVE  NEGATIVE         Barbiturates  NEGATIVE  NEGATIVE         Benzodiazepines   NEGATIVE  NEGATIVE         Cocaine  NEGATIVE  NEGATIVE         Marijuana  NEGATIVE  NEGATIVE         Methadone  NEGATIVE  NEGATIVE         Opiates  NEGATIVE  NEGATIVE         Phencyclidine  NEGATIVE  NEGATIVE         CBC WITH AUTOMATED DIFF          Collection Time: 09/08/17  9:30 AM         Result  Value  Ref Range            WBC  15.1 (H)  4.0 - 11.0 1000/mm3       RBC  3.67  3.60 - 5.20 M/uL       HGB  10.8 (L)  13.0 - 17.2 gm/dl       HCT  32.9 (L)  37.0 - 50.0 %       MCV  89.6  80.0 - 98.0 fL       MCH  29.4  25.4 - 34.6 pg       MCHC  32.8  30.0 - 36.0 gm/dl       PLATELET  212  140 - 450 1000/mm3       MPV  11.0 (H)  6.0 - 10.0 fL       RDW-SD  44.0  36.4 - 46.3         NRBC  0  0 - 0         IMMATURE GRANULOCYTES  0.6  0.0 - 3.0 %       NEUTROPHILS  92.1 (H)  34 - 64 %       LYMPHOCYTES  4.6 (L)  28 - 48 %       MONOCYTES  2.6  1 - 13 %       EOSINOPHILS  0.0  0 - 5 %       BASOPHILS  0.1  0 - 3 %       LACTIC ACID          Collection Time: 09/08/17  9:30 AM         Result  Value  Ref Range            Lactic Acid  10.6 (  HH)  0.4 - 2.0 mmol/L       MAGNESIUM          Collection Time: 09/08/17  9:30 AM         Result  Value  Ref Range            Magnesium  2.4  1.6 - 2.6 mg/dl       TSH 3RD GENERATION          Collection Time: 09/08/17  9:30 AM         Result  Value  Ref Range            TSH  0.506  0.358 - 3.740 uIU/mL       ETHYL ALCOHOL          Collection Time: 09/08/17  9:30 AM         Result  Value  Ref Range            ALCOHOL(ETHYL),SERUM  NEGATIVE  0.0 - 9.0 mg/dl       ACETAMINOPHEN          Collection Time: 09/08/17  9:30 AM         Result  Value  Ref Range            Acetaminophen level  <2.0 (L)  10.0 - 56.2 mcg/ml       SALICYLATE          Collection Time: 09/08/17  9:30 AM         Result  Value  Ref Range            Salicylate  1.8 (L)  2.8 - 20.0 mg/dl       METABOLIC PANEL, BASIC          Collection Time: 09/08/17  9:30 AM         Result  Value  Ref Range            Sodium  127 (L)  136 - 145  mEq/L       Potassium  8.0 (HH)  3.5 - 5.1 mEq/L       Chloride  92 (L)  98 - 107 mEq/L       CO2  15 (L)  21 - 32 mEq/L       Glucose  291 (H)  74 - 106 mg/dl       BUN  67 (H)  7 - 25 mg/dl       Creatinine  7.6 (H)  0.6 - 1.3 mg/dl       GFR est AA  7.0          GFR est non-AA  6          Calcium  8.0 (L)  8.5 - 10.1 mg/dl       Anion gap  19 (H)  5 - 15 mmol/L       HEPATIC FUNCTION PANEL          Collection Time: 09/08/17  9:30 AM         Result  Value  Ref Range            AST (SGOT)  12 (L)  15 - 37 U/L       ALT (SGPT)  16  12 - 78 U/L       Alk. phosphatase  69  45 - 117 U/L       Bilirubin, total  0.3  0.2 - 1.0 mg/dl  Protein, total  6.8  6.4 - 8.2 gm/dl       Albumin  3.1 (L)  3.4 - 5.0 gm/dl       Bilirubin, direct  0.1  0.0 - 0.2 mg/dl       PROTHROMBIN TIME + INR          Collection Time: 09/08/17  9:30 AM         Result  Value  Ref Range            Prothrombin time  13.2 (H)  10.2 - 12.9 seconds       INR  1.1  0.1 - 1.1         TROPONIN I          Collection Time: 09/08/17  9:30 AM         Result  Value  Ref Range            Troponin-I  <0.015  0.000 - 0.045 ng/ml       CKMB PROFILE          Collection Time: 09/08/17  9:30 AM         Result  Value  Ref Range            CK  165  26 - 192 U/L       CK - MB  0.7  0.0 - 3.6 ng/ml       CK-MB Index  0.4  0.0 - 4.9 %       AMMONIA          Collection Time: 09/08/17  9:30 AM         Result  Value  Ref Range            Ammonia  31  25 - 32 umol/L       NT-PRO BNP          Collection Time: 09/08/17  9:30 AM         Result  Value  Ref Range            NT pro-BNP  2,810.0 (H)  0.0 - 125.0 pg/ml       EKG, 12 LEAD, INITIAL          Collection Time: 09/08/17 10:38 AM         Result  Value  Ref Range            Ventricular Rate  103  BPM       Atrial Rate  103  BPM       P-R Interval  202  ms       QRS Duration  94  ms       Q-T Interval  340  ms       QTC Calculation (Bezet)  445  ms       Calculated P Axis  79  degrees       Calculated R Axis  8  degrees        Calculated T Axis  77  degrees       Diagnosis                 Sinus tachycardia   Otherwise normal ECG   When compared with ECG of 06-Aug-2017 20:27,   QRS duration has increased   T wave amplitude has increased in Anterior leads   Confirmed by Mayford Knife, M.D., [place] ([id number]) on 09/08/2017 3:16:40 PM  BLOOD GAS, ARTERIAL          Collection Time: 09/08/17 10:51 AM         Result  Value  Ref Range            pH  7.070 (LL)  7.350 - 7.450         PCO2  46.5 (H)  35.0 - 45.0 mm Hg       PO2  89.1  75 - 100 mm Hg       BICARBONATE  13.7 (LL)  18.0 - 26.0 mmol/L       O2 SAT  93.0  90 - 100 %       CO2, TOTAL  15.2 (L)  24 - 29 mmol/L       BASE EXCESS  -16 (L)  -2 - 3 mmol/L       Patient temp.  96.8          Sample type  ARTERIAL          SITE  65          ALLENS TEST  Positive          Performed by  [id number]          Critical action   Critical action: Notify physician          Critical note   Critical notify: Dr. Meda Coffee          READ BACK  Read back: Yes          NOTIFY DATE   Notify date: 09/08/2017          NOTIFY TIME   Notify time: 10:55:59          GLUCOSE, POC          Collection Time: 09/08/17 12:48 PM         Result  Value  Ref Range            Glucose (POC)  350 (H)  65 - 105 mg/dL       PROCALCITONIN          Collection Time: 09/08/17  1:55 PM         Result  Value  Ref Range            PROCALCITONIN  0.38  0.00 - 0.50 ng/ml       POTASSIUM          Collection Time: 09/08/17  1:55 PM         Result  Value  Ref Range            Potassium  6.4 (HH)  3.5 - 5.1 mEq/L       LIPASE          Collection Time: 09/08/17  1:55 PM         Result  Value  Ref Range            Lipase  88  73 - 393 U/L       OSMOLALITY, SERUM/PLASMA          Collection Time: 09/08/17  1:55 PM         Result  Value  Ref Range            Osmolality, serum/plasma  320 (H)  280 - 301 mOsm/kg       METABOLIC PANEL, BASIC          Collection Time: 09/08/17  1:55 PM  Result  Value  Ref Range            Sodium  131 (L)  136 - 145 mEq/L        Potassium  7.8 (HH)  3.5 - 5.1 mEq/L       Chloride  100  98 - 107 mEq/L       CO2  14 (LL)  21 - 32 mEq/L       Glucose  362 (H)  74 - 106 mg/dl       BUN  63 (H)  7 - 25 mg/dl       Creatinine  7.6 (H)  0.6 - 1.3 mg/dl       GFR est AA  7.0          GFR est non-AA  6          Calcium  7.3 (L)  8.5 - 10.1 mg/dl       Anion gap  16 (H)  5 - 15 mmol/L       POC CHEM8          Collection Time: 09/08/17  1:56 PM         Result  Value  Ref Range            Sodium  129 (L)  136 - 145 mEq/L       Potassium  7.8 (HH)  3.5 - 4.9 mEq/L       Chloride  103  98 - 107 mEq/L       CO2, TOTAL  13 (LL)  21 - 32 mmol/L       Glucose  350 (H)  74 - 106 mg/dL       BUN  62 (H)  7 - 25 mg/dl       Creatinine  7.4 (H)  0.6 - 1.3 mg/dl       HCT  32 (L)  38 - 45 %       HGB  10.9 (L)  12.4 - 17.2 gm/dl       CALCIUM,IONIZED  3.80 (L)  4.40 - 5.40 mg/dL       BLOOD GAS, ARTERIAL          Collection Time: 09/08/17  2:11 PM         Result  Value  Ref Range            pH  7.009 (LL)  7.350 - 7.450         PCO2  48.1 (H)  35.0 - 45.0 mm Hg       PO2  139.7 (H)  75 - 100 mm Hg       BICARBONATE  12.2 (LL)  18.0 - 26.0 mmol/L       O2 SAT  97.4  90 - 100 %       CO2, TOTAL  13.7 (LL)  24 - 29 mmol/L       BASE EXCESS  -18 (L)  -2 - 3 mmol/L       Patient temp.  97.8          Sample type  ARTERIAL          FIO2  55          SITE  R Brach          DEVICE  Adult Vent          ALLENS TEST  Positive  Performed by  [id number]          Respiratory Rate  12          T VTE  534          PEEP/CPAP  5          Total resp. rate  12          Critical action   Critical action: Notify physician          Critical note   Critical notify: Myldred Raju M          READ BACK  Read back: Yes          NOTIFY DATE   Notify date: 09/08/2017          NOTIFY TIME   Notify time: 14:14:04          GLUCOSE, POC          Collection Time: 09/08/17  2:35 PM         Result  Value  Ref Range            Glucose (POC)  372 (H)  65 - 105 mg/dL              No results for  input(s): FIO2I, IFO2, HCO3I, IHCO3, HCOPOC, PCO2I, PCOPOC, IPHI, PHI, PHPOC, PO2I, PO2POC in the last 72 hours.      No lab exists for component: IPOC2        All Micro Results               Procedure  Component  Value  Units  Date/Time           L. Warm [place], [place] [[id number]]              Order Status:  No result  Specimen:  Urine             Zara Chess, UR/CSF [[id number]]              Order Status:  No result  Specimen:  Other             CULTURE, BLOOD [[id number]]  Collected:  09/08/17 1115            Order Status:  Completed  Specimen:  Blood  Updated:  09/08/17 1152           CULTURE, BLOOD [[id number]]  Collected:  09/08/17 1115            Order Status:  Completed  Specimen:  Blood  Updated:  09/08/17 1152           CULTURE, URINE [[id number]]  Collected:  09/08/17 0915            Order Status:  Completed  Specimen:  Urine  Updated:  09/08/17 1009                      Imaging:   _0  I have personally reviewed the patients chest radiographs images and report  with the patient     Results from Hospital Encounter encounter on 09/08/17     XR CHEST SNGL V           Narrative  INDICATION:   Post intubation         EXAMINATION:   XR CHEST SNGL V      COMPARISON:   09/08/2017      FINDINGS:   The study shows a normal sized heart.. Increased opacity at the right  lower lung   field.. Known left-sided lung nodule. Also known multiple bullae. Endotracheal   tube in good position.              Impression  IMPRESSION:   1. Developing right lower lobe infiltrate. Endotracheal tube in good position.                Results from [place] encounter on 09/08/17     CT HEAD WO CONT           Narrative  INDICATION:   Altered mental status.          COMPARISON:   None      TECHNIQUE:   The CT scan of the head was performed without contrast. Coronal and sagittal   reconstructions were obtained.   DICOM format image data is available to non-affiliated external healthcare   facilities or entities on a secure, media free,  reciprocally searchable basis   with patient authorization for 12 months following the date of the study.      FINDINGS:   The ventricular system shows normal dimensions and morphology. The white and   gray matter is well differentiated. Periventricular confluent white matter   hypoattenuation. Rounded 11.6 mm hypodensity in the left posterior fossa   adjacent to the left petrous bone and the tentorium suggesting meningioma. This   is unchanged. There is no intracranial bleed or evidence of an acute infarct.    No identification of a fracture. Mild ethmoid sinusitis.                       Impression  IMPRESSION:    1. Chronic microvascular white matter ischemic disease.   2. 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   3. Mild ethmoid sinusitis.                        All Micro Results               Procedure  Component  Value  Units  Date/Time           L. [place], [place] [[id number]]              Order Status:  No result  Specimen:  Urine             Zara Chess, UR/CSF [[id number]]              Order Status:  No result  Specimen:  Other             CULTURE, BLOOD [[id number]]  Collected:  09/08/17 1115            Order Status:  Completed  Specimen:  Blood  Updated:  09/08/17 1152           CULTURE, BLOOD [[id number]]  Collected:  09/08/17 1115            Order Status:  Completed  Specimen:  Blood  Updated:  09/08/17 1152           CULTURE, URINE [[id number]]  Collected:  09/08/17 0915            Order Status:  Completed  Specimen:  Urine  Updated:  09/08/17 1009                '[x]'$  See my orders for details  My assessment, plan  of care, findings, medications, side effects etc were discussed with:      _0  nursing  _1  PT/OT      _2  respiratory therapy  _3  Dr.Camarato, MD     _4  family  _5  Patient        _6  Total care time exclusive of procedures 12 m inutes with complex decision making performed and > 50% time spent in  face to face consultation.      Donavan Foil, MD

## 2017-09-08 NOTE — Procedures (Signed)
Procedures  by Hilda BladesStewart, Lora L., RN at 09/08/17 1625                Author: Hilda BladesStewart, Lora L., RN  Service: Vascular Access  Author Type: Registered Nurse       Filed: 09/08/17 1627  Date of Service: 09/08/17 1625  Status: Signed          Editor: Hilda BladesStewart, Lora L., RN (Registered Nurse)            Procedures        1. PICC INSERTION VASCULAR ACCESS TEAM [PRO1127]        2. PICC INSERTION VASCULAR ACCESS TEAM [[id number]][PRO1127]

## 2017-09-08 NOTE — Procedures (Signed)
[facility], [facility]   Procedure Note    Date of Surgery: 09/08/2017    [facility]   Surgeon(s): Embry Huss  Assistant(s): Tera PartridgeMichelle L Milley Vining, MD  Pre-operative Diagnosis: Acute renal failure  Post-operative Diagnosis: Same as above  Procedure(s) Performed:  Non-tunneled dialysis catheter placement with ultrasound guidance  Anesthesia:  Local  Findings:  Catheter flushed and drew well at conclusion of the procedure.  Complications: None  Estimated Blood Loss:  Minimal  Tubes and Drains:  None  Specimens: * Cannot find log *    Procedure in Detail:   After informed consent was obtained, the patient was placed supine. A surgical time-out was performed. The patient was identified and the procedure verified.  The right leg was prepped with chlorhexidine and draped in a sterile manner.  1% Lidocaine was used to anesthetize the skin.  The right femoral vein was punctured under ultrasound guidance with an 18G needle. A J-wire was then placed into the iliac vein.   The skin of the thigh area was incised. Next, the needle was removed over the wire. 2 dilators were passed consecutively over the wire into the right femoral vein.. Next, the 20 cm catheter was inserted into the right femoral  over the wire. The wire was then removed and the pigtail port was closed. The catheter was found to aspirate blood easily. All ports were flushed with saline. The catheter was sutured to the skin withnylon.  Biopatch was applied to the catheter exit site. The surgical site was covered with a gauze and a Tegaderm.  The patient tolerated the procedure well without complications.       Tera PartridgeMichelle L Mckaylie Vasey, MD

## 2017-09-09 ENCOUNTER — Inpatient Hospital Stay: Admit: 2017-09-09 | Payer: MEDICARE | Primary: Physician Assistant

## 2017-09-09 LAB — METABOLIC PANEL, COMPREHENSIVE
ALT (SGPT): 13 U/L (ref 12–78)
AST (SGOT): 11 U/L — ABNORMAL LOW (ref 15–37)
Albumin: 2.4 gm/dl — ABNORMAL LOW (ref 3.4–5.0)
Alk. phosphatase: 53 U/L (ref 45–117)
Anion gap: 10 mmol/L (ref 5–15)
BUN: 32 mg/dl — ABNORMAL HIGH (ref 7–25)
Bilirubin, total: 1.2 mg/dl — ABNORMAL HIGH (ref 0.2–1.0)
CO2: 33 mEq/L — ABNORMAL HIGH (ref 21–32)
Calcium: 7.3 mg/dl — ABNORMAL LOW (ref 8.5–10.1)
Chloride: 92 mEq/L — ABNORMAL LOW (ref 98–107)
Creatinine: 4.3 mg/dl — ABNORMAL HIGH (ref 0.6–1.3)
GFR est AA: 13
GFR est non-AA: 11
Glucose: 217 mg/dl — ABNORMAL HIGH (ref 74–106)
Potassium: 4.9 mEq/L (ref 3.5–5.1)
Protein, total: 5.8 gm/dl — ABNORMAL LOW (ref 6.4–8.2)
Sodium: 135 mEq/L — ABNORMAL LOW (ref 136–145)

## 2017-09-09 LAB — BLOOD GAS, ARTERIAL
ALLENS TEST: POSITIVE
ALLENS TEST: POSITIVE
BASE EXCESS: 2 mmol/L (ref <=3)
BASE EXCESS: 10 mmol/L — ABNORMAL HIGH (ref <=3)
BICARBONATE: 22.8 mmol/L (ref 18.0–26.0)
BICARBONATE: 33.4 mmol/L — CR (ref 18.0–26.0)
CO2, TOTAL: 23.5 mmol/L — ABNORMAL LOW (ref 24–29)
CO2, TOTAL: 34.6 mmol/L — ABNORMAL HIGH (ref 24–29)
Expiratory time: 3
FIO2: 40
FIO2: 40
Inspiratory Time: 1
Mean Airway Pressure: 12
O2 SAT: 97.7 % (ref 90–100)
O2 SAT: 96.5 % (ref 90–100)
PCO2: 24.6 mm Hg — CL (ref 35.0–45.0)
PCO2: 40.3 mm Hg (ref 35.0–45.0)
PEEP/CPAP: 5
PEEP/CPAP: 5
PIP (POC): 33
PO2: 82.5 mm Hg (ref 75–100)
PO2: 75.6 mm Hg (ref 75–100)
Patient temp.: 99.3
Patient temp.: 98
Performed by: 92738
Performed by: 97009
Respiratory Rate: 18
Respiratory Rate: 14
T VTE: 457
T VTE: 400
Total resp. rate: 18
Total resp. rate: 14
pH: 7.575 — CR (ref 7.350–7.450)
pH: 7.525 — CR (ref 7.350–7.450)

## 2017-09-09 LAB — CBC WITH AUTOMATED DIFF
BASOPHILS: 0.2 % (ref 0–3)
EOSINOPHILS: 0 % (ref 0–5)
HCT: 28.4 % — ABNORMAL LOW (ref 37.0–50.0)
HGB: 9.9 gm/dl — ABNORMAL LOW (ref 13.0–17.2)
IMMATURE GRANULOCYTES: 0.6 % (ref 0.0–3.0)
LYMPHOCYTES: 9.5 % — ABNORMAL LOW (ref 28–48)
MCH: 29.3 pg (ref 25.4–34.6)
MCHC: 34.9 gm/dl (ref 30.0–36.0)
MCV: 84 fL (ref 80.0–98.0)
MONOCYTES: 6.5 % (ref 1–13)
MPV: 10.8 fL — ABNORMAL HIGH (ref 6.0–10.0)
NEUTROPHILS: 83.2 % — ABNORMAL HIGH (ref 34–64)
NRBC: 0 (ref 0–0)
PLATELET: 171 10*3/uL (ref 140–450)
RBC: 3.38 M/uL — ABNORMAL LOW (ref 3.60–5.20)
RDW-SD: 41.2 (ref 36.4–46.3)
WBC: 12.4 10*3/uL — ABNORMAL HIGH (ref 4.0–11.0)

## 2017-09-09 LAB — HEPATIC FUNCTION PANEL
ALT (SGPT): 13 U/L (ref 12–78)
AST (SGOT): 8 U/L — ABNORMAL LOW (ref 15–37)
Albumin: 2.6 gm/dl — ABNORMAL LOW (ref 3.4–5.0)
Alk. phosphatase: 57 U/L (ref 45–117)
Bilirubin, direct: 0.2 mg/dl (ref 0.0–0.2)
Bilirubin, total: 0.6 mg/dl (ref 0.2–1.0)
Protein, total: 6.1 gm/dl — ABNORMAL LOW (ref 6.4–8.2)

## 2017-09-09 LAB — METABOLIC PANEL, BASIC
Anion gap: 17 mmol/L — ABNORMAL HIGH (ref 5–15)
BUN: 30 mg/dl — ABNORMAL HIGH (ref 7–25)
CO2: 24 mEq/L (ref 21–32)
Calcium: 7.5 mg/dl — ABNORMAL LOW (ref 8.5–10.1)
Chloride: 95 mEq/L — ABNORMAL LOW (ref 98–107)
Creatinine: 4.2 mg/dl — ABNORMAL HIGH (ref 0.6–1.3)
GFR est AA: 14
GFR est non-AA: 11
Glucose: 267 mg/dl — ABNORMAL HIGH (ref 74–106)
Potassium: 5.4 mEq/L — ABNORMAL HIGH (ref 3.5–5.1)
Sodium: 136 mEq/L (ref 136–145)

## 2017-09-09 LAB — GLUCOSE, POC
Glucose (POC): 254 mg/dL — ABNORMAL HIGH (ref 65–105)
Glucose (POC): 237 mg/dL — ABNORMAL HIGH (ref 65–105)
Glucose (POC): 136 mg/dL — ABNORMAL HIGH (ref 65–105)
Glucose (POC): 103 mg/dL (ref 65–105)

## 2017-09-09 LAB — LACTIC ACID
Lactic Acid: 7.4 mmol/L — CR (ref 0.4–2.0)
Lactic Acid: 4.8 mmol/L — CR (ref 0.4–2.0)
Lactic Acid: 2.9 mmol/L — ABNORMAL HIGH (ref 0.4–2.0)

## 2017-09-09 LAB — PHOSPHORUS: Phosphorus: 3.5 mg/dl (ref 2.5–4.9)

## 2017-09-09 LAB — POTASSIUM: Potassium: 5.3 mEq/L — ABNORMAL HIGH (ref 3.5–5.1)

## 2017-09-09 MED ORDER — INSULIN LISPRO 100 UNIT/ML INJECTION
100 unit/mL | SUBCUTANEOUS | Status: DC | PRN
Start: 2017-09-09 — End: 2017-09-14

## 2017-09-09 MED ORDER — INSULIN GLARGINE 100 UNIT/ML INJECTION
100 unit/mL | Freq: Every evening | SUBCUTANEOUS | Status: DC
Start: 2017-09-09 — End: 2017-09-09

## 2017-09-09 MED ORDER — DEXTROSE 50% IN WATER (D50W) IV
INTRAVENOUS | Status: DC | PRN
Start: 2017-09-09 — End: 2017-09-14

## 2017-09-09 MED ORDER — MORPHINE 2 MG/ML INJECTION
2 mg/mL | INTRAMUSCULAR | Status: DC | PRN
Start: 2017-09-09 — End: 2017-09-14
  Administered 2017-09-12 – 2017-09-14 (×10): via INTRAVENOUS

## 2017-09-09 MED ORDER — SODIUM CHLORIDE 0.9 % IV
100 mg/mL (10%) | Freq: Once | INTRAVENOUS | Status: AC
Start: 2017-09-09 — End: 2017-09-09
  Administered 2017-09-09: 12:00:00 via INTRAVENOUS

## 2017-09-09 MED ORDER — INSULIN LISPRO 100 UNIT/ML INJECTION
100 unit/mL | Freq: Four times a day (QID) | SUBCUTANEOUS | Status: DC
Start: 2017-09-09 — End: 2017-09-14
  Administered 2017-09-09 – 2017-09-14 (×14): via SUBCUTANEOUS

## 2017-09-09 MED ORDER — PATIROMER CALCIUM SORBITEX 8.4 GRAM ORAL POWDER PACKET
8.4 gram | ORAL | Status: AC
Start: 2017-09-09 — End: 2017-09-09
  Administered 2017-09-09: 21:00:00 via ORAL

## 2017-09-09 MED ORDER — GLUCAGON 1 MG INJECTION
1 mg | INTRAMUSCULAR | Status: DC | PRN
Start: 2017-09-09 — End: 2017-09-14

## 2017-09-09 MED ORDER — FLUTICASONE 100 MCG-VILANTEROL 25 MCG/DOSE BREATH ACTIVATED INHALER
100-25 mcg/dose | Freq: Every day | RESPIRATORY_TRACT | Status: DC
Start: 2017-09-09 — End: 2017-09-09

## 2017-09-09 MED ORDER — BUDESONIDE 0.5 MG/2 ML NEB SUSPENSION
0.5 mg/2 mL | Freq: Two times a day (BID) | RESPIRATORY_TRACT | Status: DC
Start: 2017-09-09 — End: 2017-09-14
  Administered 2017-09-10 – 2017-09-14 (×10): via RESPIRATORY_TRACT

## 2017-09-09 MED FILL — DEXTROSE 5% IN WATER (D5W) IV: INTRAVENOUS | Qty: 850

## 2017-09-09 MED FILL — IPRATROPIUM-ALBUTEROL 2.5 MG-0.5 MG/3 ML NEB SOLUTION: 2.5 mg-0.5 mg/3 ml | RESPIRATORY_TRACT | Qty: 3

## 2017-09-09 MED FILL — BD POSIFLUSH NORMAL SALINE 0.9 % INJECTION SYRINGE: INTRAMUSCULAR | Qty: 40

## 2017-09-09 MED FILL — BD POSIFLUSH NORMAL SALINE 0.9 % INJECTION SYRINGE: INTRAMUSCULAR | Qty: 100

## 2017-09-09 MED FILL — NOREPINEPHRINE BITARTRATE 8 MG/250 ML (32 MCG/ML) IN 0.9 % NACL IV: 8 mg/250 mL (32 mcg/mL) | INTRAVENOUS | Qty: 250

## 2017-09-09 MED FILL — BUDESONIDE 0.5 MG/2 ML NEB SUSPENSION: 0.5 mg/2 mL | RESPIRATORY_TRACT | Qty: 1

## 2017-09-09 MED FILL — HEPARIN (PORCINE) 5,000 UNIT/ML IJ SOLN: 5000 unit/mL | INTRAMUSCULAR | Qty: 1

## 2017-09-09 MED FILL — DIPRIVAN 10 MG/ML INTRAVENOUS EMULSION: 10 mg/mL | INTRAVENOUS | Qty: 100

## 2017-09-09 MED FILL — PANTOPRAZOLE 40 MG IV SOLR: 40 mg | INTRAVENOUS | Qty: 40

## 2017-09-09 MED FILL — VELTASSA 8.4 GRAM ORAL POWDER PACKET: 8.4 gram | ORAL | Qty: 1

## 2017-09-09 MED FILL — SOLU-MEDROL (PF) 125 MG/2 ML SOLUTION FOR INJECTION: 125 mg/2 mL | INTRAMUSCULAR | Qty: 1

## 2017-09-09 MED FILL — CALCIUM GLUCONATE 100 MG/ML (10%) IV SOLN: 100 mg/mL (10%) | INTRAVENOUS | Qty: 50

## 2017-09-09 MED FILL — PIPERACILLIN-TAZOBACTAM 3.375 GRAM IV SOLR: 3.375 gram | INTRAVENOUS | Qty: 3.38

## 2017-09-09 MED FILL — ROSUVASTATIN 10 MG TAB: 10 mg | ORAL | Qty: 1

## 2017-09-09 MED FILL — NALOXONE 1 MG/ML IJ SYRG(AKA NARCAN): 1 mg/mL | INTRAMUSCULAR | Qty: 4

## 2017-09-09 MED FILL — HEPARIN, PORCINE (PF) 10 UNIT/ML IV SYRINGE: 10 unit/mL | INTRAVENOUS | Qty: 5

## 2017-09-09 MED FILL — MONTELUKAST 10 MG TAB: 10 mg | ORAL | Qty: 1

## 2017-09-09 MED FILL — DIPRIVAN 10 MG/ML INTRAVENOUS EMULSION: 10 mg/mL | INTRAVENOUS | Qty: 200

## 2017-09-09 NOTE — Progress Notes (Signed)
ICU Patient Progress Note    [facility]     Name: DESHUNDRA WALLER   DOB: 03/15/1951   MRN: [id number]   Date: 09/09/2017    [x]I have reviewed the flowsheet and previous day???s notes. Events, vitals, medications and notes from last 24 hours reviewed. Care plan discussed  on multidisciplinary rounds.    IMPRESSION:   ?? Altered mental status and poor airway protection requiring intubation and ventilator support- interval improvement   ?? Hypotension hypovolemic versus septic shock- interval improvement   ?? Acute renal failure leading to severe hyperkalemia and metabolic acidosis- requiring IHD  ?? Severe lactic acidosis  ?? Leukocytosis  ?? Chronic obstructive lung disease and  Stable pulmonary nodule (18 mm X 16 mm LLL)  ?? Hepatitis C  ?? Wheezing due to asthma   ?? Report of nausea and vomiting   PLAN:   ?? Currently on weaning trial, if she is okay consider liberation from ventilator.  Titrate FiO2, and target oxygen saturation greater than 88%.  ?? Continue to fluid resuscitation.  Follow ins and outs.  Follow renal function.  Further management of acute kidney injury as per nephrology.  It is post IHD on 09/08/2017.  ?? Continue vancomycin.  Continue Zosyn.  Further antibiotic de-escalation based on culture results  ?? Consider DuoNeb and Pulmicort per nebulizer  ?? Consider subcu heparin for DVT prophylaxis                    Allergy:  Allergies   Allergen Reactions   ??? Chocolate [Cocoa] Sneezing         Current Facility-Administered Medications   Medication Dose Route Frequency Provider Last Rate Last Dose   ??? patiromer calcium sorbitex (VELTASSA) powder 8.4 g  8.4 g Oral NOW Zafar, Ather, MD       ??? morphine injection 2 mg  2 mg IntraVENous Q4H PRN Marzella Schlein, MD       ??? dextrose (D50) infusion 5-25 g  10-50 mL IntraVENous PRN Marzella Schlein, MD       ??? glucagon (GLUCAGEN) injection 1 mg  1 mg IntraMUSCular PRN Marzella Schlein, MD        ??? insulin lispro (HUMALOG) injection 1-100 Units  1-100 Units SubCUTAneous AC&HS Marzella Schlein, MD       ??? insulin lispro (HUMALOG) injection 1-100 Units  1-100 Units SubCUTAneous PRN Marzella Schlein, MD       ??? piperacillin-tazobactam (ZOSYN) 3.375 g in 0.9% sodium chloride (MBP/ADV) 100 mL MBP  3.375 g IntraVENous Q12H Marzella Schlein, MD 25 mL/hr at 09/09/17 1044 3.375 g at 09/09/17 1044   ??? dextrose (D50W) injection syrg 25 g  25 g IntraVENous PRN Marzella Schlein, MD       ??? montelukast (SINGULAIR) tablet 10 mg  10 mg Oral QHS Marzella Schlein, MD   10 mg at 09/08/17 2106   ??? rosuvastatin (CRESTOR) tablet 5 mg  5 mg Oral QHS Marzella Schlein, MD   5 mg at 09/08/17 2106   ??? tiotropium bromide (SPIRIVA RESPIMAT) 2.5 mcg /actuation  1 Puff Inhalation DAILY Marzella Schlein, MD   Stopped at 09/09/17 1000   ??? white petrolatum-mineral oil (GENTEAL PM) 85-15 % ophthalmic ointment 1 Each  1 Each Both Eyes DAILY Serita Sheller, MD   1 Each at 09/09/17 0813   ??? midazolam in normal saline (VERSED) 1 mg/mL infusion  1-15 mg/hr IntraVENous TITRATE Serita Sheller, MD       ???  fentaNYL citrate (PF) injection 100-200 mcg  100-200 mcg IntraVENous Q1H PRN Serita Sheller, MD       ??? NOREPINephrine (LEVOPHED) 8 mg in 0.9% NS 263m infusion  2-16 mcg/min IntraVENous TITRATE NSerita Sheller MD   Stopped at 09/09/17 0037   ??? naloxone (NARCAN) injection 0.1 mg  0.1 mg IntraVENous PRN CMarzella Schlein MD       ??? acetaminophen (TYLENOL) tablet 650 mg  650 mg Oral Q4H PRN CMarzella Schlein MD        Or   ??? acetaminophen (TYLENOL) solution 650 mg  650 mg Oral Q4H PRN CMarzella Schlein MD        Or   ??? acetaminophen (TYLENOL) suppository 650 mg  650 mg Rectal Q4H PRN CMarzella Schlein MD       ??? ondansetron (ZOFRAN) injection 4 mg  4 mg IntraVENous Q4H PRN CMarzella Schlein MD       ??? albuterol-ipratropium (DUO-NEB) 2.5 MG-0.5 MG/3 ML  3 mL Nebulization Q6H RT CMarzella Schlein MD   3 mL at 09/09/17 1344    ??? heparin (porcine) injection 5,000 Units  5,000 Units SubCUTAneous Q8H CMarzella Schlein MD   5,000 Units at 09/09/17 [id number]  ??? insulin glargine (LANTUS) injection 1-100 Units  1-100 Units SubCUTAneous QHS CMarzella Schlein MD   14 Units at 09/08/17 2316   ??? albuterol-ipratropium (DUO-NEB) 2.5 MG-0.5 MG/3 ML  3 mL Nebulization Q4H PRN CMarzella Schlein MD       ??? pantoprazole (PROTONIX) 40 mg in sodium chloride 0.9% 10 mL injection  40 mg IntraVENous DAILY CMarzella Schlein MD   40 mg at 09/09/17 04709  ??? propofol (DIPRIVAN) infusion  0-50 mcg/kg/min IntraVENous TITRATE PDonavan Foil MD   Stopped at 09/09/17 1308   ??? heparin (porcine) pf 50 Units  50 Units InterCATHeter DAILY CMarzella Schlein MD   50 Units at 09/09/17 1045   ??? heparin (porcine) pf 50 Units  50 Units InterCATHeter PRN CMarzella Schlein MD       ??? sodium chloride (NS) flush 10 mL  10 mL InterCATHeter DAILY CMarzella Schlein MD   10 mL at 09/09/17 1045   ??? sodium chloride (NS) flush 10 mL  10 mL InterCATHeter PRN CMarzella Schlein MD       ??? fluticasone-vilanterol (BREO ELLIPTA) 1059m-25mcg/puff  1 Puff Inhalation DAILY CaMarzella SchleinMD   Stopped at 09/09/17 1000        Past Medical History:  Past Medical History:   Diagnosis Date   ??? Arthritis    ??? 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 COPD [place]ch[place] obstructive pulmonary disease) [place]   ??? DM2 (diabetes mellitus, type 2) ([facility]   ??? GERD (gastroesophageal reflux disease)    ??? Hepatitis C     HEP C   ??? HTN (hypertension)    ??? Lung nodule, multiple     (CT 10/2016) Small left upper lobe and 1.7 x 1.6 cm left lower lobe nodules not significantly changed from 07/23/2016 and 03/22/2016   ??? Marijuana use    ??? PTSD (post-traumatic stress disorder)     [place]   ??? Thoracic ascending aortic aneurysm [place]    4.4cm noted on CT Chest (10/2016)   ??? Thyroid nodule     2.5 cm stable right thyroid nodule (CT 10/2016)        Review of Systems  Constitutional: Negative.    HENT: Negative.    Eyes: Negative.    Respiratory: Negative.    Cardiovascular: Negative.    Gastrointestinal: Negative.    Genitourinary: Negative.    Musculoskeletal: Negative.    Skin: Negative.    Neurological: Negative.    Endo/Heme/Allergies: Negative.    Psychiatric/Behavioral: Negative.        Vital Signs:    Visit Vitals  BP 107/84   Pulse 91   Temp 98 ??F (36.7 ??C)   Resp 13   Ht 5' 8" (1.727 m)   Wt 101.5 kg (223 lb 12.3 oz)   LMP  (Exact Date)   SpO2 96%   Breastfeeding? No   BMI 34.02 kg/m??       O2 Device: Nasal cannula, Humidifier   O2 Flow Rate (L/min): 5 l/min   Temp (24hrs), Avg:97.6 ??F (36.4 ??C), Min:95.9 ??F (35.5 ??C), Max:99.3 ??F (37.4 ??C)       Intake/Output:   Last shift:      03/30 0701 - 03/30 1900  In: 813.9 [I.V.:813.9]  Out: 790 [Urine:790]  Last 3 shifts: 03/28 1901 - 03/30 0700  In: 4433.2 [I.V.:4433.2]  Out: 9458 [Urine:1190]    Intake/Output Summary (Last 24 hours) at 09/09/2017 1526  Last data filed at 09/09/2017 1505  Gross per 24 hour   Intake 4147.04 ml   Output 1970 ml   Net 2177.04 ml       Ventilator Settings:  Ventilator Mode: Spontaneous  Respiratory Rate  Resp Rate Observed: 13  Back-Up Rate: 15  Insp Time (sec): 1.08 sec  Insp Flow (l/min): 33.1 l/min  I:E Ratio: 1:2.9  Ventilator Volumes  Vt Exhaled (Machine Breath) (ml): 470 ml  Vt Spont (ml): 479 ml  Ve Observed (l/min): 7.1 l/min  Ventilator Pressures  PC Set: 20  Pressure Support (cm H2O): 8 cm H2O  PIP Observed (cm H2O): 15 cm H2O  Plateau Pressure (cm H2O): 21 cm H2O  MAP (cm H2O): 7.6  PEEP/VENT (cm H2O): 5 cm H20  Auto PEEP Observed (cm H2O): 0.7 cm H2O    Mode Rate Tidal Volume Pressure FiO2 PEEP   Spontaneous      8 cm H2O 40 % 5 cm H20     Peak airway pressure: 15 cm H2O    Minute ventilation: 7.1 l/min      ARDS network Guidelines: Lung protective strategy and Pl pressure goals  less than or equal to 30    Physical Exam    Constitutional: She is oriented to person, place, and time. She appears well-developed.   HENT:   Head: Normocephalic and atraumatic.   Eyes: Pupils are equal, round, and reactive to light. EOM are normal.   Neck: Normal range of motion. Neck supple.   Cardiovascular: Regular rhythm.   Pulmonary/Chest: She has wheezes.   Abdominal: Soft. Bowel sounds are normal.   Musculoskeletal: Normal range of motion. She exhibits no edema.   Neurological: She is oriented to person, place, and time.   Skin: Skin is warm and dry.   Psychiatric: She has a normal mood and affect.   Nursing note and vitals reviewed.      DATA:   Current Facility-Administered Medications   Medication Dose Route Frequency   ??? patiromer calcium sorbitex (VELTASSA) powder 8.4 g  8.4 g Oral NOW   ??? morphine injection 2 mg  2 mg IntraVENous Q4H PRN   ??? dextrose (D50) infusion 5-25 g  10-50 mL IntraVENous PRN   ???  glucagon (GLUCAGEN) injection 1 mg  1 mg IntraMUSCular PRN   ??? insulin lispro (HUMALOG) injection 1-100 Units  1-100 Units SubCUTAneous AC&HS   ??? insulin lispro (HUMALOG) injection 1-100 Units  1-100 Units SubCUTAneous PRN   ??? piperacillin-tazobactam (ZOSYN) 3.375 g in 0.9% sodium chloride (MBP/ADV) 100 mL MBP  3.375 g IntraVENous Q12H   ??? dextrose (D50W) injection syrg 25 g  25 g IntraVENous PRN   ??? montelukast (SINGULAIR) tablet 10 mg  10 mg Oral QHS   ??? rosuvastatin (CRESTOR) tablet 5 mg  5 mg Oral QHS   ??? tiotropium bromide (SPIRIVA RESPIMAT) 2.5 mcg /actuation  1 Puff Inhalation DAILY   ??? white petrolatum-mineral oil (GENTEAL PM) 85-15 % ophthalmic ointment 1 Each  1 Each Both Eyes DAILY   ??? midazolam in normal saline (VERSED) 1 mg/mL infusion  1-15 mg/hr IntraVENous TITRATE   ??? fentaNYL citrate (PF) injection 100-200 mcg  100-200 mcg IntraVENous Q1H PRN   ??? NOREPINephrine (LEVOPHED) 8 mg in 0.9% NS 22m infusion  2-16 mcg/min IntraVENous TITRATE   ??? naloxone (NARCAN) injection 0.1 mg  0.1 mg IntraVENous PRN    ??? acetaminophen (TYLENOL) tablet 650 mg  650 mg Oral Q4H PRN    Or   ??? acetaminophen (TYLENOL) solution 650 mg  650 mg Oral Q4H PRN    Or   ??? acetaminophen (TYLENOL) suppository 650 mg  650 mg Rectal Q4H PRN   ??? ondansetron (ZOFRAN) injection 4 mg  4 mg IntraVENous Q4H PRN   ??? albuterol-ipratropium (DUO-NEB) 2.5 MG-0.5 MG/3 ML  3 mL Nebulization Q6H RT   ??? heparin (porcine) injection 5,000 Units  5,000 Units SubCUTAneous Q8H   ??? insulin glargine (LANTUS) injection 1-100 Units  1-100 Units SubCUTAneous QHS   ??? albuterol-ipratropium (DUO-NEB) 2.5 MG-0.5 MG/3 ML  3 mL Nebulization Q4H PRN   ??? pantoprazole (PROTONIX) 40 mg in sodium chloride 0.9% 10 mL injection  40 mg IntraVENous DAILY   ??? propofol (DIPRIVAN) infusion  0-50 mcg/kg/min IntraVENous TITRATE   ??? heparin (porcine) pf 50 Units  50 Units InterCATHeter DAILY   ??? heparin (porcine) pf 50 Units  50 Units InterCATHeter PRN   ??? sodium chloride (NS) flush 10 mL  10 mL InterCATHeter DAILY   ??? sodium chloride (NS) flush 10 mL  10 mL InterCATHeter PRN   ??? fluticasone-vilanterol (BREO ELLIPTA) 1034m-25mcg/puff  1 Puff Inhalation DAILY       Telemetry: []Sinus []A-flutter []Paced    []A-fib []Multiple PVC???s                  Labs:  Recent Results (from the past 24 hour(s))   POTASSIUM    Collection Time: 09/08/17  4:20 PM   Result Value Ref Range    Potassium 7.9 (HH) 3.5 - 5.1 mEq/L   METABOLIC PANEL, BASIC    Collection Time: 09/08/17  4:20 PM   Result Value Ref Range    Sodium 130 (L) 136 - 145 mEq/L    Potassium 7.9 (HH) 3.5 - 5.1 mEq/L    Chloride 100 98 - 107 mEq/L    CO2 13 (LL) 21 - 32 mEq/L    Glucose 427 (HH) 74 - 106 mg/dl    BUN 65 (H) 7 - 25 mg/dl    Creatinine 7.2 (H) 0.6 - 1.3 mg/dl    GFR est AA 7.0      GFR est non-AA 6      Calcium 7.5 (L) 8.5 - 10.1 mg/dl  Anion gap 17 (H) 5 - 15 mmol/L   INFLUENZA A/B, BY PCR    Collection Time: 09/08/17  5:20 PM   Result Value Ref Range    Influenza A PCR NEGATIVE NEGATIVE      Influenza B PCR NEGATIVE NEGATIVE      LEGIONELLA PNEUMOPHILA AG, URINE    Collection Time: 09/08/17  5:25 PM   Result Value Ref Range    Legionella Ag, urine NEGATIVE NEGATIVE     S.PNEUMO AG, UR/CSF    Collection Time: 09/08/17  5:25 PM   Result Value Ref Range    Strep pneumo Ag, urine NEGATIVE NEGATIVE     GLUCOSE, POC    Collection Time: 09/08/17  6:17 PM   Result Value Ref Range    Glucose (POC) 300 (H) 65 - 105 mg/dL   LACTIC ACID    Collection Time: 09/08/17  7:00 PM   Result Value Ref Range    Lactic Acid 6.2 (HH) 0.4 - 2.0 mmol/L   CULTURE, RESPIRATORY/SPUTUM/BRONCH W GRAM STAIN    Collection Time: 09/08/17  7:00 PM   Result Value Ref Range    GRAM STAIN (A)       Moderate Squamous Epithelial Cells  Moderate WBC'S  Few  Gram Positive Cocci In Pairs And Clusters      Culture result Few Commensal Flora Isolated After 24 Hours     POTASSIUM    Collection Time: 09/08/17  9:28 PM   Result Value Ref Range    Potassium 5.3 (H) 3.5 - 5.1 mEq/L   LACTIC ACID    Collection Time: 09/08/17  9:28 PM   Result Value Ref Range    Lactic Acid 7.4 (HH) 0.4 - 2.0 mmol/L   GLUCOSE, POC    Collection Time: 09/08/17 11:47 PM   Result Value Ref Range    Glucose (POC) 254 (H) 65 - 105 mg/dL   LACTIC ACID    Collection Time: 09/09/17 12:05 AM   Result Value Ref Range    Lactic Acid 4.8 (HH) 0.4 - 2.0 mmol/L   METABOLIC PANEL, BASIC    Collection Time: 09/09/17 12:05 AM   Result Value Ref Range    Sodium 136 136 - 145 mEq/L    Potassium 5.4 (H) 3.5 - 5.1 mEq/L    Chloride 95 (L) 98 - 107 mEq/L    CO2 24 21 - 32 mEq/L    Glucose 267 (H) 74 - 106 mg/dl    BUN 30 (H) 7 - 25 mg/dl    Creatinine 4.2 (H) 0.6 - 1.3 mg/dl    GFR est AA 14.0      GFR est non-AA 11      Calcium 7.5 (L) 8.5 - 10.1 mg/dl    Anion gap 17 (H) 5 - 15 mmol/L   HEPATIC FUNCTION PANEL    Collection Time: 09/09/17 12:05 AM   Result Value Ref Range    AST (SGOT) 8 (L) 15 - 37 U/L    ALT (SGPT) 13 12 - 78 U/L    Alk. phosphatase 57 45 - 117 U/L    Bilirubin, total 0.6 0.2 - 1.0 mg/dl     Protein, total 6.1 (L) 6.4 - 8.2 gm/dl    Albumin 2.6 (L) 3.4 - 5.0 gm/dl    Bilirubin, direct 0.2 0.0 - 0.2 mg/dl   CBC WITH AUTOMATED DIFF    Collection Time: 09/09/17 12:05 AM   Result Value Ref Range    WBC 12.4 (H) 4.0 -  11.0 1000/mm3    RBC 3.38 (L) 3.60 - 5.20 M/uL    HGB 9.9 (L) 13.0 - 17.2 gm/dl    HCT 28.4 (L) 37.0 - 50.0 %    MCV 84.0 80.0 - 98.0 fL    MCH 29.3 25.4 - 34.6 pg    MCHC 34.9 30.0 - 36.0 gm/dl    PLATELET 171 140 - 450 1000/mm3    MPV 10.8 (H) 6.0 - 10.0 fL    RDW-SD 41.2 36.4 - 46.3      NRBC 0 0 - 0      IMMATURE GRANULOCYTES 0.6 0.0 - 3.0 %    NEUTROPHILS 83.2 (H) 34 - 64 %    LYMPHOCYTES 9.5 (L) 28 - 48 %    MONOCYTES 6.5 1 - 13 %    EOSINOPHILS 0.0 0 - 5 %    BASOPHILS 0.2 0 - 3 %   BLOOD GAS, ARTERIAL    Collection Time: 09/09/17  2:46 AM   Result Value Ref Range    pH 7.575 (HH) 7.350 - 7.450      PCO2 24.6 (LL) 35.0 - 45.0 mm Hg    PO2 82.5 75 - 100 mm Hg    BICARBONATE 22.8 18.0 - 26.0 mmol/L    O2 SAT 97.7 90 - 100 %    CO2, TOTAL 23.5 (L) 24 - 29 mmol/L    BASE EXCESS 2 -2 - 3 mmol/L    Patient temp. 99.3      Sample type ARTERIAL      FIO2 40      SITE R Radial      DEVICE Adult Vent      ALLENS TEST Positive      Performed by [id number]      Inspiratory Time 1      Respiratory Rate 18      T VTE 457      MODE PC      PEEP/CPAP 5      PIP (POC) 33      Total resp. rate 18      Expiratory time 3      Mean Airway Pressure 12      Critical action  Critical action: Notify RN      Critical note  Critical notify: Nita Sells NP      READ BACK Read back: Yes      NOTIFY DATE  Notify date: 09/09/2017      NOTIFY TIME  Notify time: 02:52:18     BLOOD GAS, ARTERIAL    Collection Time: 09/09/17  5:45 AM   Result Value Ref Range    pH 7.525 (HH) 7.350 - 7.450      PCO2 40.3 35.0 - 45.0 mm Hg    PO2 75.6 75 - 100 mm Hg    BICARBONATE 33.4 (HH) 18.0 - 26.0 mmol/L    O2 SAT 96.5 90 - 100 %    CO2, TOTAL 34.6 (H) 24 - 29 mmol/L    BASE EXCESS 10 (H) -2 - 3 mmol/L    Patient temp. 98.0       Sample type ARTERIAL      FIO2 40      SITE R Radial      DEVICE Adult Vent      ALLENS TEST Positive      Performed by [id number]      Respiratory Rate 14      T VTE 400      MODE  PC      PEEP/CPAP 5      Total resp. rate 14      Critical action  Critical action: Notify RN      Critical note  Critical notify: Nita Sells NP      READ BACK Read back: Yes      NOTIFY DATE  Notify date: 09/09/2017      NOTIFY TIME  Notify time: 05:49:49     GLUCOSE, POC    Collection Time: 09/09/17  6:05 AM   Result Value Ref Range    Glucose (POC) 237 (H) 65 - 105 mg/dL   LACTIC ACID    Collection Time: 09/09/17  8:00 AM   Result Value Ref Range    Lactic Acid 2.9 (H) 0.4 - 2.0 mmol/L   PHOSPHORUS    Collection Time: 09/09/17  8:00 AM   Result Value Ref Range    Phosphorus 3.5 2.5 - 4.9 mg/dl   METABOLIC PANEL, COMPREHENSIVE    Collection Time: 09/09/17  8:00 AM   Result Value Ref Range    Sodium 135 (L) 136 - 145 mEq/L    Potassium 4.9 3.5 - 5.1 mEq/L    Chloride 92 (L) 98 - 107 mEq/L    CO2 33 (H) 21 - 32 mEq/L    Glucose 217 (H) 74 - 106 mg/dl    BUN 32 (H) 7 - 25 mg/dl    Creatinine 4.3 (H) 0.6 - 1.3 mg/dl    GFR est AA 13.0      GFR est non-AA 11      Calcium 7.3 (L) 8.5 - 10.1 mg/dl    AST (SGOT) 11 (L) 15 - 37 U/L    ALT (SGPT) 13 12 - 78 U/L    Alk. phosphatase 53 45 - 117 U/L    Bilirubin, total 1.2 (H) 0.2 - 1.0 mg/dl    Protein, total 5.8 (L) 6.4 - 8.2 gm/dl    Albumin 2.4 (L) 3.4 - 5.0 gm/dl    Anion gap 10 5 - 15 mmol/L   GLUCOSE, POC    Collection Time: 09/09/17 11:15 AM   Result Value Ref Range    Glucose (POC) 136 (H) 65 - 105 mg/dL           No results for input(s): FIO2I, IFO2, HCO3I, IHCO3, HCOPOC, PCO2I, PCOPOC, IPHI, PHI, PHPOC, PO2I, PO2POC in the last 72 hours.    No lab exists for component: IPOC2    Imaging:  [x]I have personally reviewed the patient???s chest radiographs images and report with the patient  Results from Hospital Encounter encounter on 09/08/17   XR CHEST SNGL V    Narrative INDICATION:   Post intubation       EXAMINATION:  XR CHEST SNGL V    COMPARISON:  09/08/2017    FINDINGS:  The study shows a normal sized heart.. Increased opacity at the right lower lung  field.. Known left-sided lung nodule. Also known multiple bullae. Endotracheal  tube in good position.      Impression IMPRESSION:  1. Developing right lower lobe infiltrate. Endotracheal tube in good position.         Results from [facility] encounter on 09/08/17   CT HEAD WO CONT    Narrative INDICATION:  Altered mental status.        COMPARISON:  None    TECHNIQUE:  The CT scan of the head was performed without contrast. Coronal and sagittal  reconstructions were obtained.  DICOM  format image data is available to non-affiliated external healthcare  facilities or entities on a secure, media free, reciprocally searchable basis  with patient authorization for 12 months following the date of the study.    FINDINGS:  The ventricular system shows normal dimensions and morphology. The white and  gray matter is well differentiated. Periventricular confluent white matter  hypoattenuation. Rounded 11.6 mm hypodensity in the left posterior fossa  adjacent to the left petrous bone and the tentorium suggesting meningioma. This  is unchanged. There is no intracranial bleed or evidence of an acute infarct.   No identification of a fracture. Mild ethmoid sinusitis.             Impression IMPRESSION:   1. Chronic microvascular white matter ischemic disease.  2. Small unchanged rounded hyperdense lesion in the left posterior fossa  adjacent to the petrous bone and tentorium likely meningioma.  3. Mild ethmoid sinusitis.               All Micro Results     Procedure Component Value Units Date/Time    CULTURE, RESPIRATORY/SPUTUM/BRONCH Sid Falcon STAIN [[id number]]  (Abnormal) Collected:  09/08/17 1900    Order Status:  Completed Specimen:  Respiratory specimen Updated:  09/09/17 1311     GRAM STAIN       Moderate Squamous Epithelial Cells  Moderate WBC'S   Few  Gram Positive Cocci In Pairs And Clusters       Culture result       Few Commensal Flora Isolated After 24 Hours          CULTURE, URINE [[id number]]  (Abnormal) Collected:  09/08/17 0915    Order Status:  Completed Specimen:  Urine Updated:  09/09/17 1125     Isolate --        >100,000 CFU/mL  Gram Negative Bacilli Isolated  Identification And Susceptibility To Follow      CULTURE, BLOOD [[id number]] Collected:  09/08/17 1115    Order Status:  Completed Specimen:  Blood Updated:  09/09/17 0719     Blood Culture Result       Culture In Progress, Daily Updates To Follow          CULTURE, BLOOD [[id number]] Collected:  09/08/17 1115    Order Status:  Completed Specimen:  Blood Updated:  09/09/17 0719     Blood Culture Result       Culture In Progress, Daily Updates To Follow          L. Cathrine Muster [[id number]] Collected:  09/08/17 1725    Order Status:  Completed Specimen:  Respiratory specimen from Urine Updated:  09/08/17 1813     Legionella Ag, urine NEGATIVE        Comment: Presumptive negative for L. pneumophila serogroup 1 antigen in urine, suggesting no recent or  current infection. Infection due to Legionella cannot be ruled out since other serogroups and  species may cause disease, antigen may not be present in urine in early infection and the level  of antigen present in the urine may be below the detection limit of the test.         S.Judie Petit, UR/CSF [[id number]] Collected:  09/08/17 1725    Order Status:  Completed Specimen:  Respiratory specimen from Urine Updated:  09/08/17 1813     Strep pneumo Ag, urine NEGATIVE        Comment: Presumptive negative for pneumococcal pneumonia, suggesting no current or recent pneumococcal  infection.  Infection due to  S. pneumoniae cannot be ruled out since the antigen present in the  sample may be below the detection limit of the test.               []See my orders for details    My assessment, plan of care, findings, medications, side effects etc were  discussed with:  [x]nursing []PT/OT    [x]respiratory therapy [x]Dr.Camarato, MD   []family []     []Total critical care time exclusive of procedures 32 minutes with complex decision making performed and > 50% time spent in face to face evaluation.    Donavan Foil, MD

## 2017-09-09 NOTE — Progress Notes (Signed)
Daily Vent Progress Note      PATIENT NAME: [name] [name]    ?  Principal Problem:  <principal problem not specified>        Active Problems:   Problem List as of 09/09/2017 Date Reviewed: October 02, 2017          Codes Class Noted - Resolved    Hyperkalemia ICD-10-CM: E87.5  ICD-9-CM: 276.7  10/02/2017 - Present        Obtunded ICD-10-CM: R40.1  ICD-9-CM: 780.09  10/02/17 - Present        Acute renal failure (ARF) (HCC) ICD-10-CM: N17.9  ICD-9-CM: 584.9  02-Oct-2017 - Present        Septic shock (HCC) ICD-10-CM: A41.9, R65.21  ICD-9-CM: 038.9, 785.52, 995.92  02-Oct-2017 - Present        Metabolic encephalopathy ICD-10-CM: G93.41  ICD-9-CM: 348.31  2017-10-02 - Present        Dehydration ICD-10-CM: E86.0  ICD-9-CM: 276.51  08/07/2017 - Present        COPD with acute exacerbation (HCC) ICD-10-CM: J44.1  ICD-9-CM: 491.21  08/07/2017 - Present        Acute-on-chronic kidney injury (HCC) ICD-10-CM: N17.9, N18.9  ICD-9-CM: 584.9, 585.9  08/07/2017 - Present        Chest pain ICD-10-CM: R07.9  ICD-9-CM: 786.50  06/15/2017 - Present        Dyspnea ICD-10-CM: R06.00  ICD-9-CM: 786.09  06/15/2017 - Present        Type 2 diabetes mellitus with diabetic neuropathy (HCC) ICD-10-CM: E11.40  ICD-9-CM: 250.60, 357.2  12/07/2016 - Present        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9  ICD-9-CM: 038.9, 995.91  07/24/2016 - Present         Asthma exacerbation ICD-10-CM: J45.901  ICD-9-CM: 493.92  07/24/2016 - Present        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: E11.21  ICD-9-CM: 250.40, 583.81  06/12/2016 - Present        Sacroiliitis (HCC) ICD-10-CM: M46.1  ICD-9-CM: 720.2  11/25/2015 - Present        Essential hypertension ICD-10-CM: I10  ICD-9-CM: 401.9  10/06/2015 - Present        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  ICD-9-CM: 721.3  09/15/2015 - Present        Lumbar and sacral osteoarthritis ICD-10-CM: M47.817  ICD-9-CM: 721.3  09/15/2015 - Present        Chronic pain syndrome ICD-10-CM: G89.4  ICD-9-CM: 338.4  09/15/2015 - Present        Type 2 diabetes mellitus with hyperglycemia, without long-term current use of insulin (HCC) ICD-10-CM: E11.65  ICD-9-CM: 250.00, 790.29  08/20/2015 - Present        Acute  colitis ICD-10-CM: K52.9  ICD-9-CM: 558.9  07/02/2015 - Present        Acute hyperglycemia ICD-10-CM: R73.9  ICD-9-CM: 790.29  07/02/2015 - Present        Accelerated hypertension ICD-10-CM: I10  ICD-9-CM: 401.0  04/08/2015 - Present        Severe headache ICD-10-CM: R51  ICD-9-CM: 784.0  04/07/2015 - Present        Osteoarthritis of hips, 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  ICD-9-CM: 715.95  01/29/2015 - Present        PTSD (post-traumatic stress disorder) (Chronic) ICD-10-CM: F43.10  ICD-9-CM: 309.81  01/29/2015 - Present        Severe hypertension (Chronic) ICD-10-CM: I10  ICD-9-CM: 401.9  07/21/2014 - Present        RESOLVED: New onset type 1 diabetes mellitus, uncontrolled (HCC) ICD-10-CM: E10.65  ICD-9-CM: 250.03  07/02/2015 - 10/06/2015        RESOLVED: Recurrent major depressive disorder, in full remission (HCC) ICD-10-CM: F33.42  ICD-9-CM: 296.36  12/02/2014 - 12/02/2014        RESOLVED: Foreign body in colon ICD-10-CM: T18.4XXA  ICD-9-CM: 936  08/14/2014 - 01/29/2015        RESOLVED: Advanced care planning/counseling discussion ICD-10-CM: Z61.09Z71.89  ICD-9-CM: V65.49  07/21/2014 - 01/29/2015     Overview Signed 07/21/2014  3:46 PM by Littie Deedsharakan, Jolson K, MD     Patient given State [place] application               RESOLVED: Hip pain, chronic ICD-10-CM: M25.559, G89.29  ICD-9-CM: 719.45, 338.29  07/21/2014 - 01/29/2015                   LOS: 1 day       Vital signs in last 24 hours:  Temp: 99.3 ??F (37.4 ??C) (09/09/17 0202)  Pulse (Heart Rate): 93 (09/09/17 0301)  Resp Rate: 14 (09/09/17 0301)  BP: 108/70 (09/09/17 0202)  FIO2 (%): 40 %  FIO2 (%): 40 % (09/09/17 0301)      Last ABG;     pH   Date Value Ref Range Status   09/09/2017 7.575 (HH) 7.350 - 7.450   Final     PCO2   Date Value Ref Range Status   09/09/2017 24.6 (LL) 35.0 - 45.0 mm Hg Final     PO2   Date Value Ref Range Status   09/09/2017 82.5 75 - 100 mm Hg Final     BICARBONATE   Date Value Ref Range Status   09/09/2017 22.8 18.0 - 26.0 mmol/L Final     O2 SAT   Date Value Ref Range Status   09/09/2017 97.7 90 - 100 % Final     BASE EXCESS   Date Value Ref Range Status   09/09/2017 2 -2 - 3 mmol/L Final     SITE   Date Value Ref Range Status   09/09/2017 R Radial   Final     Sample type   Date Value Ref Range Status   09/09/2017 ARTERIAL   Final     ALLENS TEST   Date Value Ref Range Status   09/09/2017 Positive   Final     FIO2   Date Value Ref Range Status   09/09/2017 40   Final     DEVICE   Date Value Ref Range Status   09/09/2017 Adult Vent   Final          ARDS is classified according to the degree of hypoxemia (PaO2/FiO2 ratio),  mild (PaO2/FiO2, 201???300), moderate (PaO2/FiO2, 101???200), and severe (PaO2/FiO2???100)           ET -TUBE: 8.0 ETT, 22 cm @ lips      Ventilator Mode: Pressure control (09/08/17 2357)  FIO2 (%): 40 % (09/09/17 0301)      PEEP/VENT (cm H2O): 5 cm H20 (09/09/17 0301)   MAP (cm H2O): 11 (09/09/17 0301)    Breath Sounds:  Coarse breath sounds that clear with suctioning    Secretions: moderate to large amount thick white    Chest CXR:   CXR Results  (Last 48 hours)                09/08/17 1317  XR CHEST SNGL V Final result    Impression:  IMPRESSION:   1. Developing right lower lobe infiltrate. Endotracheal tube in good position.           Narrative:  INDICATION:   Post intubation          EXAMINATION:   XR CHEST SNGL V       COMPARISON:   09/08/2017       FINDINGS:   The study shows a normal sized heart.. Increased opacity at the right lower lung   field.. Known left-sided lung nodule. Also known multiple bullae. Endotracheal   tube in good position.           09/08/17 0940  XR CHEST SNGL V Final result    Impression:  IMPRESSION:   1. Known bullous emphysema.   2. Known stable 18 mm peripheral left lower lobe lung nodule.   3. No acute process.           Narrative:  INDICATION:   Altered mental status, hypoxia, bibasilar Rales          EXAMINATION:   XR CHEST SNGL V       COMPARISON:   08/06/2017       FINDINGS:   The study shows a normal sized heart.. Known bilateral lung bullae. Known and   stable 18 mm left lower lobe peripheral lung nodule.. No superimposed acute   process.                 Weaning Trial:  None tonight    Weaning Parameters:   n/a                               Summary:  Pt changed to 14 breaths per minute and Pressure control decreased from 26 to 23 after post dialysis ABG. Blood gas results shown to Everlene Balls, NP.  ?            Plan:  Wean as tolerated.  Electronically Signed By: Earlie Counts, RT

## 2017-09-09 NOTE — Other (Signed)
Pt load to [facility]

## 2017-09-09 NOTE — Progress Notes (Signed)
Daily Vent Progress Note      PATIENT NAME: [name] [name]    ?  Principal Problem:  <principal problem not specified>        Active Problems:   Problem List as of 09/09/2017 Date Reviewed: 09-20-2017          Codes Class Noted - Resolved    Hyperkalemia ICD-10-CM: E87.5  ICD-9-CM: 276.7  09/20/17 - Present        Obtunded ICD-10-CM: R40.1  ICD-9-CM: 780.09  09-20-2017 - Present        Acute renal failure (ARF) (HCC) ICD-10-CM: N17.9  ICD-9-CM: 584.9  2017-09-20 - Present        Septic shock (HCC) ICD-10-CM: A41.9, R65.21  ICD-9-CM: 038.9, 785.52, 995.92  09/20/2017 - Present        Metabolic encephalopathy ICD-10-CM: G93.41  ICD-9-CM: 348.31  2017/09/20 - Present        Dehydration ICD-10-CM: E86.0  ICD-9-CM: 276.51  08/07/2017 - Present        COPD with acute exacerbation (HCC) ICD-10-CM: J44.1  ICD-9-CM: 491.21  08/07/2017 - Present        Acute-on-chronic kidney injury (HCC) ICD-10-CM: N17.9, N18.9  ICD-9-CM: 584.9, 585.9  08/07/2017 - Present        Chest pain ICD-10-CM: R07.9  ICD-9-CM: 786.50  06/15/2017 - Present        Dyspnea ICD-10-CM: R06.00  ICD-9-CM: 786.09  06/15/2017 - Present        Type 2 diabetes mellitus with diabetic neuropathy (HCC) ICD-10-CM: E11.40  ICD-9-CM: 250.60, 357.2  12/07/2016 - Present        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9  ICD-9-CM: 038.9, 995.91  07/24/2016 - Present         Asthma exacerbation ICD-10-CM: J45.901  ICD-9-CM: 493.92  07/24/2016 - Present        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: E11.21  ICD-9-CM: 250.40, 583.81  06/12/2016 - Present        Sacroiliitis (HCC) ICD-10-CM: M46.1  ICD-9-CM: 720.2  11/25/2015 - Present        Essential hypertension ICD-10-CM: I10  ICD-9-CM: 401.9  10/06/2015 - Present        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  ICD-9-CM: 721.3  09/15/2015 - Present        Lumbar and sacral osteoarthritis ICD-10-CM: M47.817  ICD-9-CM: 721.3  09/15/2015 - Present        Chronic pain syndrome ICD-10-CM: G89.4  ICD-9-CM: 338.4  09/15/2015 - Present        Type 2 diabetes mellitus with hyperglycemia, without long-term current use of insulin (HCC) ICD-10-CM: E11.65  ICD-9-CM: 250.00, 790.29  08/20/2015 - Present        Acute  colitis ICD-10-CM: K52.9  ICD-9-CM: 558.9  07/02/2015 - Present        Acute hyperglycemia ICD-10-CM: R73.9  ICD-9-CM: 790.29  07/02/2015 - Present        Accelerated hypertension ICD-10-CM: I10  ICD-9-CM: 401.0  04/08/2015 - Present        Severe headache ICD-10-CM: R51  ICD-9-CM: 784.0  04/07/2015 - Present        Osteoarthritis of hips, 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  ICD-9-CM: 715.95  01/29/2015 - Present        PTSD (post-traumatic stress disorder) (Chronic) ICD-10-CM: F43.10  ICD-9-CM: 309.81  01/29/2015 - Present        Severe hypertension (Chronic) ICD-10-CM: I10  ICD-9-CM: 401.9  07/21/2014 - Present        RESOLVED: New onset type 1 diabetes mellitus, uncontrolled (HCC) ICD-10-CM: E10.65  ICD-9-CM: 250.03  07/02/2015 - 10/06/2015        RESOLVED: Recurrent major depressive disorder, in full remission (HCC) ICD-10-CM: F33.42  ICD-9-CM: 296.36  12/02/2014 - 12/02/2014        RESOLVED: Foreign body in colon ICD-10-CM: T18.4XXA  ICD-9-CM: 936  08/14/2014 - 01/29/2015        RESOLVED: Advanced care planning/counseling discussion ICD-10-CM: J81.19Z71.89  ICD-9-CM: V65.49  07/21/2014 - 01/29/2015     Overview Signed 07/21/2014  3:46 PM by Littie Deedsharakan, Jolson K, MD     Patient given State of [place] application               RESOLVED: Hip pain, chronic ICD-10-CM: M25.559, G89.29  ICD-9-CM: 719.45, 338.29  07/21/2014 - 01/29/2015                   LOS: 1 day       Vital signs in last 24 hours:  Temp: 98 ??F (36.7 ??C) (09/09/17 1130)  Pulse (Heart Rate): 91 (09/09/17 1202)  Resp Rate: 13 (09/09/17 1326)  BP: (!) 89/63 (09/09/17 1031)  FIO2 (%): 40 %  FIO2 (%): 40 % (09/09/17 1326)      Last ABG;     pH   Date Value Ref Range Status   09/09/2017 7.525 (HH) 7.350 - 7.450   Final     PCO2   Date Value Ref Range Status   09/09/2017 40.3 35.0 - 45.0 mm Hg Final     PO2   Date Value Ref Range Status   09/09/2017 75.6 75 - 100 mm Hg Final     BICARBONATE   Date Value Ref Range Status   09/09/2017 33.4 (HH) 18.0 - 26.0 mmol/L Final     O2 SAT   Date Value Ref Range Status   09/09/2017 96.5 90 - 100 % Final     BASE EXCESS   Date Value Ref Range Status   09/09/2017 10 (H) -2 - 3 mmol/L Final     SITE   Date Value Ref Range Status   09/09/2017 R Radial   Final     Sample type   Date Value Ref Range Status   09/09/2017 ARTERIAL   Final     ALLENS TEST   Date Value Ref Range Status   09/09/2017 Positive   Final     FIO2   Date Value Ref Range Status   09/09/2017 40   Final     DEVICE   Date Value Ref Range Status   09/09/2017 Adult Vent   Final          ARDS is classified according to the degree of hypoxemia (  PaO2/FiO2 ratio),  mild (PaO2/FiO2, 201???300), moderate (PaO2/FiO2, 101???200), and severe (PaO2/FiO2???100)           Ventilator Mode: Spontaneous (09/09/17 1326)  FIO2 (%): 40 % (09/09/17 1326)      PEEP/VENT (cm H2O): 5 cm H20 (09/09/17 1326)   MAP (cm H2O): 7.6 (09/09/17 1326)    Breath Sounds: Coarse      Secretions: moderate, white, thick    Chest CXR:   CXR Results  (Last 48 hours)               09/08/17 1317  XR CHEST SNGL V Final result    Impression:  IMPRESSION:    1. Developing right lower lobe infiltrate. Endotracheal tube in good position.           Narrative:  INDICATION:   Post intubation          EXAMINATION:   XR CHEST SNGL V       COMPARISON:   09/08/2017       FINDINGS:   The study shows a normal sized heart.. Increased opacity at the right lower lung   field.. Known left-sided lung nodule. Also known multiple bullae. Endotracheal   tube in good position.           09/08/17 0940  XR CHEST SNGL V Final result    Impression:  IMPRESSION:   1. Known bullous emphysema.   2. Known stable 18 mm peripheral left lower lobe lung nodule.   3. No acute process.           Narrative:  INDICATION:   Altered mental status, hypoxia, bibasilar Rales          EXAMINATION:   XR CHEST SNGL V       COMPARISON:   08/06/2017       FINDINGS:   The study shows a normal sized heart.. Known bilateral lung bullae. Known and   stable 18 mm left lower lobe peripheral lung nodule.. No superimposed acute   process.                 Weaning Trial: 8/5 40%      Weaning Parameters:   Spontaneous Breathing Trial Complete: Yes (09/09/17 1326)  Resp Rate Observed: 13   Ve: 7.1  VT: 479   RSBI: 27                 Summary: Extubated today at 1340. Pt on 5L NC sat 96%. Breath sounds coarse. Will continue to monitor.  ?    ?      Electronically Signed By: Collene Mares

## 2017-09-09 NOTE — Progress Notes (Addendum)
Hospitalist Progress Note       [facility]    Daily Progress Note: 09/09/2017    Assessment/Plan:     1. Likely Sepsis-POA:   -Continue Zosyn and vancomycin at renal dosing. IF no G(+) growth, consider stopping vanc after Bcxs return.  -Blood Cxs in process. Sputums Cx with only normal flora so far  -UCx growing 100K cfu of GNR-speciation pending.  -Lactate has normalized but this may be be due to HD  -Procal of 0.38   -current off Levophed.  -Source-UTI (Active Cx) vs PNA (Developing RLL on CXR).  ??  2.  Metabolic encephalopathy: Improved  -Favor Sepsis over medication effect at this point  -Patient extubated and orients to self and location, year but not month.  - Did not respond to narcan in ED.  -NH3 and TSH normal.  -No acute findings reported on head CT.    -UDS is positive only for marijuana.    -Serum alcohol level negative, negative acetaminophen level, salicylate level is not elevated.    ??  3.  Intubation for airway protection-now Extubated:   F/up recs from Pulm/CC-will order renal soft diet.  ??  4. Hyperkalemia:  -Dialyzed on 3/29-PM after K+ did not improve with medical mgmt.  -Monitor with renal failure  -nephrology following..  ??  5.  Acute renal failure on CKD-3:   -Baseline BUN and creatinine of 16 and 0.9 on February 27 of this year, there presented at that time with mild AKI with a creatinine of 1.8.   -DDX-NSAID use, N/V, Medications-home Cozaar, Aldactone.  -Mebolic acidosis has resolved after HD and off bicarb.  -Urine output may be increasing  -Strict I/O.  Monitor electrolytes.  -Udall Cath to R inguinal region.  -nephrology following  ??  6. UTI:   -Possible source for #1,   -UCx as above, Abx as above  ??  7.  COPD:   -Frequent nebs  -continue singulair.  -Add breo for home Advair  -Monitor s/p Extubation.    8. GERD:   -IV Protonix for GI prophylaxis.  ??  9. Chronic R hip pain:   -Hold nabumetone, gabapentin.    -hold Seroquel and Trazodone until sure mentation stablizies.   -may have morphine IV prn.    10. Hep C: Chronic  ??  11. N/V:  -Likely contributes to AKI, but unsure if only cause.  -Will resume diet-renal/soft/diabetic  -PRN zofran  -Abdomen distended but appears soft and compressible.  .  12. HTN-chornic, but recently off pressors  -holding home meds including Aldactone, Losartan, Norvasc, Clonidine 0.3 BID.  -if needed, consider norvasc first.    13. DM2:  -Change glucommander to PO regimen  -A1c o f9.5 in 07/2017    14. HLD  -continue Crestor    PPX: Heparin TID    Dispo: Monitor s/p extubation. Will resume diet, watch for further n/v.  Continue to monitor renal function and urine output.  F/up recs from nephro and pulm.    Subjective:     Patient recently extubated. Endorses SOB, cough, Chest pain, R hip pain.  Reports cough and n/v for several days prior to arrival.  Denies Abdominal pain.  Orients x2.    General ROS: (-) for fever, chills  Respiratory ROS: (+) for cough, shortness of breath, wheezing  Cardiovascular ROS: (+) for chest pain, swelling (-) for palpitations  Gastrointestinal ROS: (+) for n/v PTA, (-) for abdominal pain  Musculoskeletal ROS: (+) for R hip pain-chronic  Objective:   Physical Exam:     Visit Vitals  BP (!) 89/63   Pulse 91   Temp 98 ??F (36.7 ??C)   Resp 13   Ht 5' 8"  (1.727 m)   Wt 101.5 kg (223 lb 12.3 oz)   LMP  (Exact Date)   SpO2 96%   Breastfeeding? No   BMI 34.02 kg/m??    O2 Flow Rate (L/min): 5 l/min O2 Device: Nasal cannula, Humidifier    Temp (24hrs), Avg:97.5 ??F (36.4 ??C), Min:95.9 ??F (35.5 ??C), Max:99.3 ??F (37.4 ??C)    03/30 0701 - 03/30 1900  In: 624.7 [I.V.:624.7]  Out: 515 [Urine:515]   03/28 1901 - 03/30 0700  In: 4433.2 [I.V.:4433.2]  Out: 1017 [Urine:1190]    General: Alert, Obese, AAF, orients to self, location, year but not month.  Increased respiratory effort with speech-soft and hoarse.  HEENT: White sclera, Oral mucosa pink  Lymph: No palpable anterior cervical or supraclavicular LAD.   CV: Tachycardic, no murmurs, rubs, gallops  Pulm: Lungs coarse diffusely, mostly wheeze but difficult to tell apart crackles. Slightly increased WOB at rest.  GI: Soft, nontender, Distended but compressible  GU: Foley catheter in place with yellow urine in collection.  Extremity: No clubbing, cyanosis, Non-pitting edema to B UE-R > L.  Dialysis catheter to R inguinal region.  Skin: Warm, dry. Changes with Edema in arms as above with some small bullae on the R arm    Data Review:       24 Hour Results:  Recent Results (from the past 24 hour(s))   BLOOD GAS, ARTERIAL    Collection Time: 09/08/17  2:11 PM   Result Value Ref Range    pH 7.009 (LL) 7.350 - 7.450      PCO2 48.1 (H) 35.0 - 45.0 mm Hg    PO2 139.7 (H) 75 - 100 mm Hg    BICARBONATE 12.2 (LL) 18.0 - 26.0 mmol/L    O2 SAT 97.4 90 - 100 %    CO2, TOTAL 13.7 (LL) 24 - 29 mmol/L    BASE EXCESS -18 (L) -2 - 3 mmol/L    Patient temp. 97.8      Sample type ARTERIAL      FIO2 55      SITE R Brach      DEVICE Adult Vent      ALLENS TEST Positive      Performed by [id number]      Respiratory Rate 12      T VTE 534      PEEP/CPAP 5      Total resp. rate 12      Critical action  Critical action: Notify physician      Critical note  Critical notify: PATEL M      READ BACK Read back: Yes      NOTIFY DATE  Notify date: 09/08/2017      NOTIFY TIME  Notify time: 14:14:04     GLUCOSE, POC    Collection Time: 09/08/17  2:35 PM   Result Value Ref Range    Glucose (POC) 372 (H) 65 - 105 mg/dL   LACTIC ACID    Collection Time: 09/08/17  2:54 PM   Result Value Ref Range    Lactic Acid 11.0 (HH) 0.4 - 2.0 mmol/L   POTASSIUM    Collection Time: 09/08/17  4:20 PM   Result Value Ref Range    Potassium 7.9 (HH) 3.5 - 5.1 mEq/L   METABOLIC PANEL,  BASIC    Collection Time: 09/08/17  4:20 PM   Result Value Ref Range    Sodium 130 (L) 136 - 145 mEq/L    Potassium 7.9 (HH) 3.5 - 5.1 mEq/L    Chloride 100 98 - 107 mEq/L    CO2 13 (LL) 21 - 32 mEq/L    Glucose 427 (HH) 74 - 106 mg/dl     BUN 65 (H) 7 - 25 mg/dl    Creatinine 7.2 (H) 0.6 - 1.3 mg/dl    GFR est AA 7.0      GFR est non-AA 6      Calcium 7.5 (L) 8.5 - 10.1 mg/dl    Anion gap 17 (H) 5 - 15 mmol/L   INFLUENZA A/B, BY PCR    Collection Time: 09/08/17  5:20 PM   Result Value Ref Range    Influenza A PCR NEGATIVE NEGATIVE      Influenza B PCR NEGATIVE NEGATIVE     LEGIONELLA PNEUMOPHILA AG, URINE    Collection Time: 09/08/17  5:25 PM   Result Value Ref Range    Legionella Ag, urine NEGATIVE NEGATIVE     S.PNEUMO AG, UR/CSF    Collection Time: 09/08/17  5:25 PM   Result Value Ref Range    Strep pneumo Ag, urine NEGATIVE NEGATIVE     GLUCOSE, POC    Collection Time: 09/08/17  6:17 PM   Result Value Ref Range    Glucose (POC) 300 (H) 65 - 105 mg/dL   LACTIC ACID    Collection Time: 09/08/17  7:00 PM   Result Value Ref Range    Lactic Acid 6.2 (HH) 0.4 - 2.0 mmol/L   CULTURE, RESPIRATORY/SPUTUM/BRONCH W GRAM STAIN    Collection Time: 09/08/17  7:00 PM   Result Value Ref Range    GRAM STAIN (A)       Moderate Squamous Epithelial Cells  Moderate WBC'S  Few  Gram Positive Cocci In Pairs And Clusters      Culture result Few Commensal Flora Isolated After 24 Hours     POTASSIUM    Collection Time: 09/08/17  9:28 PM   Result Value Ref Range    Potassium 5.3 (H) 3.5 - 5.1 mEq/L   LACTIC ACID    Collection Time: 09/08/17  9:28 PM   Result Value Ref Range    Lactic Acid 7.4 (HH) 0.4 - 2.0 mmol/L   GLUCOSE, POC    Collection Time: 09/08/17 11:47 PM   Result Value Ref Range    Glucose (POC) 254 (H) 65 - 105 mg/dL   LACTIC ACID    Collection Time: 09/09/17 12:05 AM   Result Value Ref Range    Lactic Acid 4.8 (HH) 0.4 - 2.0 mmol/L   METABOLIC PANEL, BASIC    Collection Time: 09/09/17 12:05 AM   Result Value Ref Range    Sodium 136 136 - 145 mEq/L    Potassium 5.4 (H) 3.5 - 5.1 mEq/L    Chloride 95 (L) 98 - 107 mEq/L    CO2 24 21 - 32 mEq/L    Glucose 267 (H) 74 - 106 mg/dl    BUN 30 (H) 7 - 25 mg/dl    Creatinine 4.2 (H) 0.6 - 1.3 mg/dl     GFR est AA 14.0      GFR est non-AA 11      Calcium 7.5 (L) 8.5 - 10.1 mg/dl    Anion gap 17 (H) 5 - 15 mmol/L  HEPATIC FUNCTION PANEL    Collection Time: 09/09/17 12:05 AM   Result Value Ref Range    AST (SGOT) 8 (L) 15 - 37 U/L    ALT (SGPT) 13 12 - 78 U/L    Alk. phosphatase 57 45 - 117 U/L    Bilirubin, total 0.6 0.2 - 1.0 mg/dl    Protein, total 6.1 (L) 6.4 - 8.2 gm/dl    Albumin 2.6 (L) 3.4 - 5.0 gm/dl    Bilirubin, direct 0.2 0.0 - 0.2 mg/dl   CBC WITH AUTOMATED DIFF    Collection Time: 09/09/17 12:05 AM   Result Value Ref Range    WBC 12.4 (H) 4.0 - 11.0 1000/mm3    RBC 3.38 (L) 3.60 - 5.20 M/uL    HGB 9.9 (L) 13.0 - 17.2 gm/dl    HCT 28.4 (L) 37.0 - 50.0 %    MCV 84.0 80.0 - 98.0 fL    MCH 29.3 25.4 - 34.6 pg    MCHC 34.9 30.0 - 36.0 gm/dl    PLATELET 171 140 - 450 1000/mm3    MPV 10.8 (H) 6.0 - 10.0 fL    RDW-SD 41.2 36.4 - 46.3      NRBC 0 0 - 0      IMMATURE GRANULOCYTES 0.6 0.0 - 3.0 %    NEUTROPHILS 83.2 (H) 34 - 64 %    LYMPHOCYTES 9.5 (L) 28 - 48 %    MONOCYTES 6.5 1 - 13 %    EOSINOPHILS 0.0 0 - 5 %    BASOPHILS 0.2 0 - 3 %   BLOOD GAS, ARTERIAL    Collection Time: 09/09/17  2:46 AM   Result Value Ref Range    pH 7.575 (HH) 7.350 - 7.450      PCO2 24.6 (LL) 35.0 - 45.0 mm Hg    PO2 82.5 75 - 100 mm Hg    BICARBONATE 22.8 18.0 - 26.0 mmol/L    O2 SAT 97.7 90 - 100 %    CO2, TOTAL 23.5 (L) 24 - 29 mmol/L    BASE EXCESS 2 -2 - 3 mmol/L    Patient temp. 99.3      Sample type ARTERIAL      FIO2 40      SITE R Radial      DEVICE Adult Vent      ALLENS TEST Positive      Performed by [id number]      Inspiratory Time 1      Respiratory Rate 18      T VTE 457      MODE PC      PEEP/CPAP 5      PIP (POC) 33      Total resp. rate 18      Expiratory time 3      Mean Airway Pressure 12      Critical action  Critical action: Notify RN      Critical note  Critical notify: Nita Sells NP      READ BACK Read back: Yes      NOTIFY DATE  Notify date: 09/09/2017      NOTIFY TIME  Notify time: 02:52:18      BLOOD GAS, ARTERIAL    Collection Time: 09/09/17  5:45 AM   Result Value Ref Range    pH 7.525 (HH) 7.350 - 7.450      PCO2 40.3 35.0 - 45.0 mm Hg    PO2 75.6 75 - 100 mm Hg  BICARBONATE 33.4 (HH) 18.0 - 26.0 mmol/L    O2 SAT 96.5 90 - 100 %    CO2, TOTAL 34.6 (H) 24 - 29 mmol/L    BASE EXCESS 10 (H) -2 - 3 mmol/L    Patient temp. 98.0      Sample type ARTERIAL      FIO2 40      SITE R Radial      DEVICE Adult Vent      ALLENS TEST Positive      Performed by [id number]      Respiratory Rate 14      T VTE 400      MODE PC      PEEP/CPAP 5      Total resp. rate 14      Critical action  Critical action: Notify RN      Critical note  Critical notify: Nita Sells NP      READ BACK Read back: Yes      NOTIFY DATE  Notify date: 09/09/2017      NOTIFY TIME  Notify time: 05:49:49     GLUCOSE, POC    Collection Time: 09/09/17  6:05 AM   Result Value Ref Range    Glucose (POC) 237 (H) 65 - 105 mg/dL   LACTIC ACID    Collection Time: 09/09/17  8:00 AM   Result Value Ref Range    Lactic Acid 2.9 (H) 0.4 - 2.0 mmol/L   PHOSPHORUS    Collection Time: 09/09/17  8:00 AM   Result Value Ref Range    Phosphorus 3.5 2.5 - 4.9 mg/dl   METABOLIC PANEL, COMPREHENSIVE    Collection Time: 09/09/17  8:00 AM   Result Value Ref Range    Sodium 135 (L) 136 - 145 mEq/L    Potassium 4.9 3.5 - 5.1 mEq/L    Chloride 92 (L) 98 - 107 mEq/L    CO2 33 (H) 21 - 32 mEq/L    Glucose 217 (H) 74 - 106 mg/dl    BUN 32 (H) 7 - 25 mg/dl    Creatinine 4.3 (H) 0.6 - 1.3 mg/dl    GFR est AA 13.0      GFR est non-AA 11      Calcium 7.3 (L) 8.5 - 10.1 mg/dl    AST (SGOT) 11 (L) 15 - 37 U/L    ALT (SGPT) 13 12 - 78 U/L    Alk. phosphatase 53 45 - 117 U/L    Bilirubin, total 1.2 (H) 0.2 - 1.0 mg/dl    Protein, total 5.8 (L) 6.4 - 8.2 gm/dl    Albumin 2.4 (L) 3.4 - 5.0 gm/dl    Anion gap 10 5 - 15 mmol/L   GLUCOSE, POC    Collection Time: 09/09/17 11:15 AM   Result Value Ref Range    Glucose (POC) 136 (H) 65 - 105 mg/dL       Problem List:   Problem List as of 09/09/2017 Date Reviewed: 09/13/17          Codes Class Noted - Resolved    Hyperkalemia ICD-10-CM: E87.5  ICD-9-CM: 276.7  09-13-17 - Present        Obtunded ICD-10-CM: R40.1  ICD-9-CM: 780.09  09-13-17 - Present        Acute renal failure (ARF) ([place]) ICD-10-CM: N17.9  ICD-9-CM: 584.9  09-13-17 - Present        Septic shock ([place]) ICD-10-CM: A41.9, R65.21  ICD-9-CM: 038.9, 785.52, 995.92  2017/09/13 -  Present        Metabolic encephalopathy [id number]-[id number]: G93.41  ICD-9-CM: 348.31  09/08/2017 - Present        Dehydration ICD-10-CM: E86.0  ICD-9-CM: 276.51  08/07/2017 - Present        COPD with acute exacerbation ([place]) ICD-10-CM: J44.1  ICD-9-CM: 491.21  08/07/2017 - Present        Acute-on-chronic kidney injury ([place]) ICD-10-CM: N17.9, N18.9  ICD-9-CM: 584.9, 585.9  08/07/2017 - Present        Chest pain ICD-10-CM: R07.9  ICD-9-CM: 786.50  06/15/2017 - Present        Dyspnea ICD-10-CM: R06.00  ICD-9-CM: 786.09  06/15/2017 - Present        Type 2 diabetes mellitus with diabetic neuropathy ([place]) ICD-10-CM: E11.40  ICD-9-CM: 250.60, 357.2  12/07/2016 - Present        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  ICD-9-CM: 569.89  08/17/2016 - Present        Cannabinoid hyperemesis syndrome ([place]) ICD-10-CM: F12.988  ICD-9-CM: 536.2, 305.20  08/17/2016 - Present        Gastritis ICD-10-CM: K29.70  ICD-9-CM: 535.50  08/16/2016 - Present        Nausea & vomiting ICD-10-CM: R11.2  ICD-9-CM: 787.01  08/16/2016 - Present        Asthma with acute exacerbation ICD-10-CM: J45.901  ICD-9-CM: 493.92  08/04/2016 - Present        Lactic acidosis ICD-10-CM: E87.2  ICD-9-CM: 276.2  07/24/2016 - Present        Leukocytosis ICD-10-CM: D72.829  ICD-9-CM: 288.60  07/24/2016 - Present        Sepsis ([place]) ICD-10-CM: A41.9  ICD-9-CM: 038.9, 995.91  07/24/2016 - Present        Asthma exacerbation ICD-10-CM: J45.901  ICD-9-CM: 493.92  07/24/2016 - Present        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   ICD-9-CM: 250.40, 583.81  06/12/2016 - Present        Sacroiliitis ([place]) ICD-10-CM: M46.1  ICD-9-CM: 720.2  11/25/2015 - Present        Essential hypertension ICD-10-CM: I10  ICD-9-CM: 401.9  10/06/2015 - Present        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  ICD-9-CM: 721.3  09/15/2015 - Present        Lumbar and sacral osteoarthritis ICD-10-CM: M47.817  ICD-9-CM: 721.3  09/15/2015 - Present        Chronic pain syndrome ICD-10-CM: G89.4  ICD-9-CM: 338.4  09/15/2015 - Present        Type 2 diabetes mellitus with hyperglycemia, without long-term current use of insulin ([place]) ICD-10-CM: E11.65  ICD-9-CM: 250.00, 790.29  08/20/2015 - Present        Acute colitis ICD-10-CM: K52.9  ICD-9-CM: 558.9  07/02/2015 - Present        Acute hyperglycemia ICD-10-CM: R73.9  ICD-9-CM: 790.29  07/02/2015 - Present        Accelerated hypertension ICD-10-CM: I10  ICD-9-CM: 401.0  04/08/2015 - Present        Severe headache ICD-10-CM: R51  ICD-9-CM: 784.0  04/07/2015 - Present        Osteoarthritis of hips, 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  ICD-9-CM: 715.95  01/29/2015 - Present        PTSD (post-traumatic stress disorder) (Chronic) ICD-10-CM: F43.10  ICD-9-CM: 309.81  01/29/2015 - Present        Severe hypertension (Chronic) ICD-10-CM: I10  ICD-9-CM: 401.9  07/21/2014 - Present  RESOLVED: New onset type 1 diabetes mellitus, uncontrolled ([place]) ICD-10-CM: E10.65  ICD-9-CM: 250.03  07/02/2015 - 10/06/2015        RESOLVED: Recurrent major depressive disorder, in full remission ([place]) ICD-10-CM: F33.42  ICD-9-CM: 296.36  12/02/2014 - 12/02/2014        RESOLVED: Foreign body in colon ICD-10-CM: T18.4XXA  ICD-9-CM: 696  08/14/2014 - 01/29/2015        RESOLVED: Advanced care planning/counseling discussion ICD-10-CM: E95.28  ICD-9-CM: V65.49  07/21/2014 - 01/29/2015    Overview Signed 07/21/2014  3:46 PM by Lake Bells, MD     Patient given [place] application                RESOLVED: Hip pain, chronic ICD-10-CM: M25.559, G89.29  ICD-9-CM: 719.45, 338.29  07/21/2014 - 01/29/2015               Medications reviewed  Current Facility-Administered Medications   Medication Dose Route Frequency   ??? patiromer calcium sorbitex (VELTASSA) powder 8.4 g  8.4 g Oral NOW   ??? piperacillin-tazobactam (ZOSYN) 3.375 g in 0.9% sodium chloride (MBP/ADV) 100 mL MBP  3.375 g IntraVENous Q12H   ??? dextrose (D50W) injection syrg 25 g  25 g IntraVENous PRN   ??? glucagon (GLUCAGEN) injection 1 mg  1 mg IntraMUSCular PRN   ??? montelukast (SINGULAIR) tablet 10 mg  10 mg Oral QHS   ??? rosuvastatin (CRESTOR) tablet 5 mg  5 mg Oral QHS   ??? tiotropium bromide (SPIRIVA RESPIMAT) 2.5 mcg /actuation  1 Puff Inhalation DAILY   ??? white petrolatum-mineral oil (GENTEAL PM) 85-15 % ophthalmic ointment 1 Each  1 Each Both Eyes DAILY   ??? midazolam in normal saline (VERSED) 1 mg/mL infusion  1-15 mg/hr IntraVENous TITRATE   ??? fentaNYL citrate (PF) injection 100-200 mcg  100-200 mcg IntraVENous Q1H PRN   ??? NOREPINephrine (LEVOPHED) 8 mg in 0.9% NS 225m infusion  2-16 mcg/min IntraVENous TITRATE   ??? naloxone (NARCAN) injection 0.1 mg  0.1 mg IntraVENous PRN   ??? acetaminophen (TYLENOL) tablet 650 mg  650 mg Oral Q4H PRN    Or   ??? acetaminophen (TYLENOL) solution 650 mg  650 mg Oral Q4H PRN    Or   ??? acetaminophen (TYLENOL) suppository 650 mg  650 mg Rectal Q4H PRN   ??? ondansetron (ZOFRAN) injection 4 mg  4 mg IntraVENous Q4H PRN   ??? albuterol-ipratropium (DUO-NEB) 2.5 MG-0.5 MG/3 ML  3 mL Nebulization Q6H RT   ??? heparin (porcine) injection 5,000 Units  5,000 Units SubCUTAneous Q8H   ??? dextrose (D50) infusion 5-25 g  10-50 mL IntraVENous PRN   ??? glucagon (GLUCAGEN) injection 1 mg  1 mg IntraMUSCular PRN   ??? insulin glargine (LANTUS) injection 1-100 Units  1-100 Units SubCUTAneous QHS   ??? insulin lispro (HUMALOG) injection 1-100 Units  1-100 Units SubCUTAneous Q6H   ??? albuterol-ipratropium (DUO-NEB) 2.5 MG-0.5 MG/3 ML  3 mL Nebulization  Q4H PRN   ??? pantoprazole (PROTONIX) 40 mg in sodium chloride 0.9% 10 mL injection  40 mg IntraVENous DAILY   ??? propofol (DIPRIVAN) infusion  0-50 mcg/kg/min IntraVENous TITRATE   ??? *vancomycin pharmacy dosing   1 Each Other Rx Dosing/Monitoring   ??? heparin (porcine) pf 50 Units  50 Units InterCATHeter DAILY   ??? heparin (porcine) pf 50 Units  50 Units InterCATHeter PRN   ??? sodium chloride (NS) flush 10 mL  10 mL InterCATHeter DAILY   ??? sodium chloride (NS)  flush 10 mL  10 mL InterCATHeter PRN   ??? fluticasone-vilanterol (BREO ELLIPTA) 142mg-25mcg/puff  1 Puff Inhalation DAILY        Care Plan discussed with: Patient/Family, Nurse and Consultant Dr. Patel-Pulm/CC    Total time spent with patient: 38 minutes.    JMarzella Schlein MD  September 09, 2017  14:38

## 2017-09-09 NOTE — Progress Notes (Signed)
RENAL PROGRESS NOTE        [name] [name]         Assessment/Plan:     AKI: Nonoliguric. Increasing urine output. Hold HD & monitor.   HyperK and Met Acidosis: addressed with HD. Let's monitor labs overnight.   Septic Shock: UTI. Antibiotics per IM/PCCM.   HAGMA/Lactic Acidosis: HD addressed. Agree with stopping bicarb gtt.    Resp failure: FiO2 40%.                                                                                                                                    Subjective:Patient complaints of: intubated    Patient Active Problem List   Diagnosis Code   ??? Severe hypertension I10   ??? 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 PTSD (post-traumatic stress disorder) F43.10   ??? Severe headache R51   ??? Accelerated hypertension I10   ??? Acute colitis K52.9   ??? Acute hyperglycemia R73.9   ??? Type 2 diabetes mellitus with hyperglycemia, without long-term current use of insulin (HCC) E11.65   ??? 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 Lumbar and sacral osteoarthritis M47.817   ??? Chronic pain syndrome G89.4   ??? Essential hypertension I10   ??? Sacroiliitis (HCC) M46.1   ??? 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 Lactic acidosis E87.2   ??? Leukocytosis D72.829   ??? Sepsis (HCC) A41.9   ??? Asthma exacerbation J45.901   ??? Asthma with acute exacerbation J45.901   ??? Gastritis K29.70   ??? Nausea & vomiting R11.2   ??? 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 Cannabinoid hyperemesis syndrome (HCC) F12.988   ??? 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 Chest pain R07.9   ??? Dyspnea R06.00   ??? Dehydration E86.0   ??? COPD with acute exacerbation ([place]) J44.1   ??? Acute-on-chronic kidney injury ([place]) N17.9, N18.9   ??? Hyperkalemia E87.5   ??? Obtunded R40.1   ??? Acute renal failure (ARF) (HCC) N17.9   ??? Septic shock (HCC) A41.9, Y19.50   ??? Metabolic encephalopathy D32.67       Current Facility-Administered Medications    Medication Dose Route Frequency Provider Last Rate Last Dose   ??? piperacillin-tazobactam (ZOSYN) 3.375 g in 0.9% sodium chloride (MBP/ADV) 100 mL MBP  3.375 g IntraVENous Q12H Marzella Schlein, MD 25 mL/hr at 09/09/17 1044 3.375 g at 09/09/17 1044   ??? dextrose (D50W) injection syrg 25 g  25 g IntraVENous PRN Marzella Schlein, MD       ??? glucagon (GLUCAGEN) injection 1 mg  1 mg IntraMUSCular PRN Marzella Schlein, MD       ??? montelukast (SINGULAIR) tablet 10 mg  10 mg Oral QHS Marzella Schlein, MD   10 mg at 09/08/17 2106   ??? rosuvastatin (CRESTOR) tablet 5 mg  5 mg Oral  Raoul Pitch, MD   5 mg at 09/08/17 2106   ??? tiotropium bromide (SPIRIVA RESPIMAT) 2.5 mcg /actuation  1 Puff Inhalation DAILY Marzella Schlein, MD   Stopped at 09/09/17 1000   ??? white petrolatum-mineral oil (GENTEAL PM) 85-15 % ophthalmic ointment 1 Each  1 Each Both Eyes DAILY Serita Sheller, MD   1 Each at 09/09/17 0813   ??? midazolam in normal saline (VERSED) 1 mg/mL infusion  1-15 mg/hr IntraVENous TITRATE Serita Sheller, MD       ??? fentaNYL citrate (PF) injection 100-200 mcg  100-200 mcg IntraVENous Q1H PRN Serita Sheller, MD       ??? NOREPINephrine (LEVOPHED) 8 mg in 0.9% NS 278m infusion  2-16 mcg/min IntraVENous TITRATE NSerita Sheller MD   Stopped at 09/09/17 0037   ??? naloxone (NARCAN) injection 0.1 mg  0.1 mg IntraVENous PRN CMarzella Schlein MD       ??? acetaminophen (TYLENOL) tablet 650 mg  650 mg Oral Q4H PRN CMarzella Schlein MD        Or   ??? acetaminophen (TYLENOL) solution 650 mg  650 mg Oral Q4H PRN CMarzella Schlein MD        Or   ??? acetaminophen (TYLENOL) suppository 650 mg  650 mg Rectal Q4H PRN CMarzella Schlein MD       ??? ondansetron (ZOFRAN) injection 4 mg  4 mg IntraVENous Q4H PRN CMarzella Schlein MD       ??? albuterol-ipratropium (DUO-NEB) 2.5 MG-0.5 MG/3 ML  3 mL Nebulization Q6H RT CMarzella Schlein MD   3 mL at 09/09/17 04097  ??? heparin (porcine) injection 5,000 Units  5,000 Units SubCUTAneous Q8H  CMarzella Schlein MD   5,000 Units at 09/09/17 [phone]  ??? dextrose (D50) infusion 5-25 g  10-50 mL IntraVENous PRN CMarzella Schlein MD       ??? glucagon (GLUCAGEN) injection 1 mg  1 mg IntraMUSCular PRN CMarzella Schlein MD       ??? insulin glargine (LANTUS) injection 1-100 Units  1-100 Units SubCUTAneous QHS CMarzella Schlein MD   14 Units at 09/08/17 2316   ??? insulin lispro (HUMALOG) injection 1-100 Units  1-100 Units SubCUTAneous Q6H CMarzella Schlein MD   Stopped at 09/09/17 1200   ??? albuterol-ipratropium (DUO-NEB) 2.5 MG-0.5 MG/3 ML  3 mL Nebulization Q4H PRN CMarzella Schlein MD       ??? pantoprazole (PROTONIX) 40 mg in sodium chloride 0.9% 10 mL injection  40 mg IntraVENous DAILY CMarzella Schlein MD   40 mg at 09/09/17 09924  ??? propofol (DIPRIVAN) infusion  0-50 mcg/kg/min IntraVENous TITRATE PDonavan Foil MD   Stopped at 09/09/17 1308   ??? *vancomycin pharmacy dosing   1 Each Other Rx Dosing/Monitoring CMarzella Schlein MD       ??? heparin (porcine) pf 50 Units  50 Units InterCATHeter DAILY CMarzella Schlein MD   50 Units at 09/09/17 1045   ??? heparin (porcine) pf 50 Units  50 Units InterCATHeter PRN CMarzella Schlein MD       ??? sodium chloride (NS) flush 10 mL  10 mL InterCATHeter DAILY CMarzella Schlein MD   10 mL at 09/09/17 1045   ??? sodium chloride (NS) flush 10 mL  10 mL InterCATHeter PRN CMarzella Schlein MD       ??? fluticasone-vilanterol (BREO ELLIPTA) 1059m-25mcg/puff  1 Puff Inhalation DAILY CaMarzella SchleinMD   Stopped at 09/09/17 1000       Objective  Vitals:  09/09/17 1031 09/09/17 1130 09/09/17 1202 09/09/17 1326   BP: (!) 89/63      Pulse: 87  91    Resp: _0 Temp:  98 ??F (36.7 ??C)     SpO2: 97%  98%    Weight:       Height:             Intake/Output Summary (Last 24 hours) at 09/09/2017 1340  Last data filed at 09/09/2017 1307  Gross per 24 hour   Intake 4957.84 ml   Output 1695 ml   Net 3262.84 ml           Admission weight: Weight: 90.7 kg (200 lb) (09/08/17 0844)   Last Weight Metrics:  Weight Loss Metrics 09/09/2017 08/29/2017 08/09/2017 07/13/2017 06/18/2017 05/02/2017 02/22/2017   Today's Wt 223 lb 12.3 oz 200 lb 201 lb 11.5 oz 205 lb 195 lb 5.2 oz 200 lb 3.2 oz 187 lb   BMI 34.02 kg/m2 30.41 kg/m2 30.67 kg/m2 31.17 kg/m2 29.7 kg/m2 30.44 kg/m2 28.43 kg/m2           Physical Assessment:     General:intubated.  Neck: No jvd.  LUNGS: Clear to Auscultation, No rales, rhonchi or wheezes.  CVS EXM: S1, S2  RRR, no murmurs/gallops/rubs.  Abdomen: soft, non tender.   Lower Extremities:  no edema.       Lab    CBC w/Diff Recent Labs     09/09/17  0005 09/08/17  1356 09/08/17  0930   WBC 12.4*  --  15.1*   RBC 3.38*  --  3.67   HGB 9.9* 10.9* 10.8*   HCT 28.4* 32* 32.9*   PLT 171  --  212   GRANS 83.2*  --  92.1*   LYMPH 9.5*  --  4.6*   EOS 0.0  --  0.0        Chemistry Recent Labs     09/09/17  0800 09/09/17  0545 09/09/17  0246 09/09/17  0005 09/08/17  2128 09/08/17  1620  09/08/17  0930   GLU 217*  --   --  267*  --  427*   < > 291*   NA 135*  --   --  136  --  130*   < > 127*   K 4.9  --   --  5.4* 5.3* 7.9*   7.9*   < > 8.0*   CL 92*  --   --  95*  --  100   < > 92*   CO2 33* 34.6* 23.5* 24  --  13*   < > 15*   BUN 32*  --   --  30*  --  65*   < > 67*   CREA 4.3*  --   --  4.2*  --  7.2*   < > 7.6*   CA 7.3*  --   --  7.5*  --  7.5*   < > 8.0*   AGAP 10  --   --  17*  --  17*   < > 19*   AP 53  --   --  57  --   --   --  69   TP 5.8*  --   --  6.1*  --   --   --  6.8   ALB 2.4*  --   --  2.6*  --   --   --  3.1*   PHOS  3.5  --   --   --   --   --   --   --     < > = values in this interval not displayed.         No results found for: IRON, FE, TIBC, IBCT, PSAT, FERR   Lab Results   Component Value Date/Time    Calcium 7.3 (L) 09/09/2017 08:00 AM    CALCIUM,IONIZED 3.80 (L) 09/08/2017 01:56 PM    Phosphorus 3.5 09/09/2017 08:00 AM        Clearence Cheek, MD  [facility]  Pager: [phone]

## 2017-09-10 ENCOUNTER — Encounter

## 2017-09-10 LAB — CULTURE, URINE: Isolate: >100000 — AB

## 2017-09-10 LAB — METABOLIC PANEL, BASIC
Anion gap: 7 mmol/L (ref 5–15)
BUN: 31 mg/dl — ABNORMAL HIGH (ref 7–25)
CO2: 35 mEq/L — ABNORMAL HIGH (ref 21–32)
Calcium: 8.3 mg/dl — ABNORMAL LOW (ref 8.5–10.1)
Chloride: 99 mEq/L (ref 98–107)
Creatinine: 3.9 mg/dl — ABNORMAL HIGH (ref 0.6–1.3)
GFR est AA: 15
GFR est non-AA: 12
Glucose: 181 mg/dl — ABNORMAL HIGH (ref 74–106)
Potassium: 3.7 mEq/L (ref 3.5–5.1)
Sodium: 141 mEq/L (ref 136–145)

## 2017-09-10 LAB — CBC WITH AUTOMATED DIFF
BASOPHILS: 0.1 % (ref 0–3)
EOSINOPHILS: 0.2 % (ref 0–5)
HCT: 31.6 % — ABNORMAL LOW (ref 37.0–50.0)
HGB: 10.4 gm/dl — ABNORMAL LOW (ref 13.0–17.2)
IMMATURE GRANULOCYTES: 0.3 % (ref 0.0–3.0)
LYMPHOCYTES: 18.3 % — ABNORMAL LOW (ref 28–48)
MCH: 29 pg (ref 25.4–34.6)
MCHC: 32.9 gm/dl (ref 30.0–36.0)
MCV: 88 fL (ref 80.0–98.0)
MONOCYTES: 10.2 % (ref 1–13)
MPV: 10.8 fL — ABNORMAL HIGH (ref 6.0–10.0)
NEUTROPHILS: 70.9 % — ABNORMAL HIGH (ref 34–64)
NRBC: 0 (ref 0–0)
PLATELET: 186 10*3/uL (ref 140–450)
RBC: 3.59 M/uL — ABNORMAL LOW (ref 3.60–5.20)
RDW-SD: 44.4 (ref 36.4–46.3)
WBC: 10.5 10*3/uL (ref 4.0–11.0)

## 2017-09-10 LAB — LACTIC ACID
Lactic Acid: 3.6 mmol/L — ABNORMAL HIGH (ref 0.4–2.0)
Lactic Acid: 1.7 mmol/L (ref 0.4–2.0)

## 2017-09-10 LAB — GLUCOSE, POC
Glucose (POC): 205 mg/dL — ABNORMAL HIGH (ref 65–105)
Glucose (POC): 156 mg/dL — ABNORMAL HIGH (ref 65–105)
Glucose (POC): 131 mg/dL — ABNORMAL HIGH (ref 65–105)
Glucose (POC): 167 mg/dL — ABNORMAL HIGH (ref 65–105)

## 2017-09-10 MED ORDER — 1/2 NS WITH POTASSIUM CHLORIDE 20 MEQ/L IV
20 mEq/L | INTRAVENOUS | Status: DC
Start: 2017-09-10 — End: 2017-09-14
  Administered 2017-09-10 – 2017-09-14 (×7): via INTRAVENOUS

## 2017-09-10 MED ORDER — CEFTRIAXONE 1 GRAM SOLUTION FOR INJECTION
1 gram | INTRAMUSCULAR | Status: DC
Start: 2017-09-10 — End: 2017-09-14
  Administered 2017-09-10 – 2017-09-13 (×4): via INTRAVENOUS

## 2017-09-10 MED FILL — HEPARIN (PORCINE) 5,000 UNIT/ML IJ SOLN: 5000 unit/mL | INTRAMUSCULAR | Qty: 1

## 2017-09-10 MED FILL — PIPERACILLIN-TAZOBACTAM 3.375 GRAM IV SOLR: 3.375 gram | INTRAVENOUS | Qty: 3.38

## 2017-09-10 MED FILL — 1/2 NS WITH POTASSIUM CHLORIDE 20 MEQ/L IV: 20 mEq/L | INTRAVENOUS | Qty: 1000

## 2017-09-10 MED FILL — MONTELUKAST 10 MG TAB: 10 mg | ORAL | Qty: 1

## 2017-09-10 MED FILL — INSULIN LISPRO 100 UNIT/ML INJECTION: 100 unit/mL | SUBCUTANEOUS | Qty: 1

## 2017-09-10 MED FILL — BD POSIFLUSH NORMAL SALINE 0.9 % INJECTION SYRINGE: INTRAMUSCULAR | Qty: 10

## 2017-09-10 MED FILL — ROSUVASTATIN 10 MG TAB: 10 mg | ORAL | Qty: 1

## 2017-09-10 MED FILL — IPRATROPIUM-ALBUTEROL 2.5 MG-0.5 MG/3 ML NEB SOLUTION: 2.5 mg-0.5 mg/3 ml | RESPIRATORY_TRACT | Qty: 3

## 2017-09-10 MED FILL — FENTANYL CITRATE (PF) 50 MCG/ML IJ SOLN: 50 mcg/mL | INTRAMUSCULAR | Qty: 4

## 2017-09-10 MED FILL — HEPARIN, PORCINE (PF) 10 UNIT/ML IV SYRINGE: 10 unit/mL | INTRAVENOUS | Qty: 5

## 2017-09-10 MED FILL — PANTOPRAZOLE 40 MG IV SOLR: 40 mg | INTRAVENOUS | Qty: 40

## 2017-09-10 MED FILL — BUDESONIDE 0.5 MG/2 ML NEB SUSPENSION: 0.5 mg/2 mL | RESPIRATORY_TRACT | Qty: 1

## 2017-09-10 MED FILL — CEFTRIAXONE 1 GRAM SOLUTION FOR INJECTION: 1 gram | INTRAMUSCULAR | Qty: 1

## 2017-09-10 NOTE — Progress Notes (Addendum)
Post Fall Documentation    [name] ShutterMonica R [name] witnessed/unwitnessed fall occurred on 09/10/2017 (Date) at 06:40 (Time).     The answers to the following questions summarize the fall:     In the patient's own words:  ?? What were you attempting to do when you fell?  "get back to bed"  ?? Do you know why you fell? "Yes"  ?? Do you have any pain/discomfort or any other complaints? "No"  ?? Which part of your body made contact with the floor or other object?  "Bottom"  Nurse:  ? Was this an assisted fall? Yes  ? Was fall witnessed? Yes     By Whom?Craig StaggersAshley Schwaebler RN Marylou FlesherJackie DAvis RN  ? If witnessed, what part of the body made contact with the floor or other object?  Bottom  ? Patient???s mental status after the fall/when found: Alert and oriented  ? Any apparent injury:  No apparent injury  ? Immediate interventions for injury/suspected injury? No interventions needed  ? Patient assisted back to bed? Assist X2  ? Name of provider notified and time, any comments? DR. Jerl MinaAmarato   [phone] paged    ? Name of family member notified and time: Heywood IlesBobby Goodwin    Document Immediate VS and physical assessment in flowsheets.    Document Neuro assessment every hour x 4 (for potential head injury or unwitnessed fall) in flowsheets.        Craig StaggersAshley Schwaebler

## 2017-09-10 NOTE — Progress Notes (Signed)
Hospitalist Progress Note       [facility]    Daily Progress Note: 09/10/2017    Assessment/Plan:     1. Likely Sepsis due to UTI-POA:   -WBC normalizing.    -UCx reports E.coli resistant to Amp, FQs. Will stop Zoysn and change to rocephin.  -Blood Cxs with ngtd @24  hours. Sputums Cx with only normal flora so far  -Lactate has normalized but this may be be due to HD  -Procal of 0.38   -current off Levophed.  ??  2.  Metabolic encephalopathy: Resolved  -Favor Sepsis over medication effect at this point  -Orients x3.  -Did not respond to narcan in ED.  -NH3 and TSH normal.  -No acute findings reported on head CT.    -UDS is positive only for marijuana.    -Serum alcohol level negative, negative acetaminophen level, salicylate level is not elevated.    ??  3.  Intubation for airway protection on intake, Extubated on 3/30:   Wean O2 as tolerated  ??  4. Hyperkalemia: Resolved  -Dialyzed on 3/29-PM after K+ did not improve with medical mgmt.  -Monitor with renal failure  -nephrology following-has ordered 0.45% NS with of K+  ??  5.  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   -DDX-NSAID use, N/V, Medications-home Cozaar, Aldactone.  -Mebolic acidosis has resolved after HDx1 and off bicarb.  -Urine output  increasing  -Strict I/O.   -IVF per nephro  -Udall Cath to R inguinal region.    ??  6. UTI due to E.coli:   -Possible source for #1,   -UCx as above, Abx as above  ??  7.  COPD:   -Frequent nebs  -continue singulair.  -Pulm has changed Breo to pulmicort.  -Monitor s/p Extubation.    8. GERD:   -IV Protonix for GI prophylaxis.  ??  9. Chronic R hip pain:   -Hold nabumetone, gabapentin.    -hold Seroquel and Trazodone until sure mentation stablizies.  -may have morphine IV prn-none required  -Fall reported on AM of 3/31-assisted down per nursing  -Awaiting therapy consults     10. Hep C: Chronic  ??  11. N/V: PTA, none here.  -Likely contributes to AKI, but unsure if only cause.  -Tolerating diet-renal/soft/diabetic  -PRN zofran  .  12. HTN-chornic, but recently off pressors  -holding home meds including Aldactone, Losartan, Norvasc, Clonidine 0.3 BID.  -if needed, consider norvasc first.    13. DM2:  -Glucommander to PO regimen  -A1c of 9.5 in 07/2017    14. HLD  -continue Crestor    PPX: Heparin TID    Dispo: Continue to monitor renal function and urine output.  F/up recs from nephro. F/up Blood Cxs. Therapy consults. Pulm has written orders for Medical bed.    Subjective:     Patient with reported fall this AM, but nursing reports assisted down.  Patient reports she simply slipped and denies LOC or dizziness. Reports improved SOB. No Abd pain, n/v. Chronic hip pain.    General ROS: (-) for fever, chills  Respiratory ROS: Improvng cough, shortness of breath, wheezing  Cardiovascular ROS: Improved chest pain, swelling (-) for palpitations  Gastrointestinal ROS: (+) for n/v PTA-improved, (-) for abdominal pain  Musculoskeletal ROS: (+) for R hip pain-chronic    Objective:  Physical Exam:     Visit Vitals  BP 127/82   Pulse (!) 104   Temp 98.1 ??F (36.7 ??C)   Resp 17   Ht 5\' 8"  (1.727 m)   Wt 101.5 kg (223 lb 12.3 oz)   LMP  (Exact Date)   SpO2 95%   Breastfeeding? No   BMI 34.02 kg/m??    O2 Flow Rate (L/min): 2 l/min O2 Device: Nasal cannula    Temp (24hrs), Avg:98.8 ??F (37.1 ??C), Min:98.1 ??F (36.7 ??C), Max:99.4 ??F (37.4 ??C)    03/31 0701 - 03/31 1900  In: -   Out: 2000 [Urine:2000]   03/29 1901 - 03/31 0700  In: 5165 [P.O.:1610; I.V.:3555]  Out: 4971 [Urine:4970]    General: Alert, Obese, AAF, orients x3.  NAD  HEENT: White sclera, Oral mucosa pink  Lymph: No palpable anterior cervical or supraclavicular LAD.  CV: Tachycardic, no murmurs, rubs, gallops  Pulm: Lungs more diminished but no wheeze or crackles appreciated at this time.  GI: Soft, nontender, Distended but compressibl/obese   GU: Foley catheter in place with pale yellow urine in collection.  Extremity: No clubbing, cyanosis, Improved edema to B UE-R > L.  Dialysis catheter to R inguinal region.  Skin: Warm, dry.     Data Review:       24 Hour Results:  Recent Results (from the past 24 hour(s))   GLUCOSE, POC    Collection Time: 09/09/17  5:41 PM   Result Value Ref Range    Glucose (POC) 103 65 - 105 mg/dL   LACTIC ACID    Collection Time: 09/09/17  9:04 PM   Result Value Ref Range    Lactic Acid 3.6 (H) 0.4 - 2.0 mmol/L   GLUCOSE, POC    Collection Time: 09/09/17 10:19 PM   Result Value Ref Range    Glucose (POC) 205 (H) 65 - 105 mg/dL   METABOLIC PANEL, BASIC    Collection Time: 09/10/17  4:05 AM   Result Value Ref Range    Sodium 141 136 - 145 mEq/L    Potassium 3.7 3.5 - 5.1 mEq/L    Chloride 99 98 - 107 mEq/L    CO2 35 (H) 21 - 32 mEq/L    Glucose 181 (H) 74 - 106 mg/dl    BUN 31 (H) 7 - 25 mg/dl    Creatinine 3.9 (H) 0.6 - 1.3 mg/dl    GFR est AA 16.115.0      GFR est non-AA 12      Calcium 8.3 (L) 8.5 - 10.1 mg/dl    Anion gap 7 5 - 15 mmol/L   CBC WITH AUTOMATED DIFF    Collection Time: 09/10/17  4:05 AM   Result Value Ref Range    WBC 10.5 4.0 - 11.0 1000/mm3    RBC 3.59 (L) 3.60 - 5.20 M/uL    HGB 10.4 (L) 13.0 - 17.2 gm/dl    HCT 09.631.6 (L) 04.537.0 - 50.0 %    MCV 88.0 80.0 - 98.0 fL    MCH 29.0 25.4 - 34.6 pg    MCHC 32.9 30.0 - 36.0 gm/dl    PLATELET 409186 811140 - 914450 1000/mm3    MPV 10.8 (H) 6.0 - 10.0 fL    RDW-SD 44.4 36.4 - 46.3      NRBC 0 0 - 0      IMMATURE GRANULOCYTES 0.3 0.0 - 3.0 %    NEUTROPHILS 70.9 (H) 34 - 64 %  LYMPHOCYTES 18.3 (L) 28 - 48 %    MONOCYTES 10.2 1 - 13 %    EOSINOPHILS 0.2 0 - 5 %    BASOPHILS 0.1 0 - 3 %   GLUCOSE, POC    Collection Time: 09/10/17  8:52 AM   Result Value Ref Range    Glucose (POC) 156 (H) 65 - 105 mg/dL   GLUCOSE, POC    Collection Time: 09/10/17 12:56 PM   Result Value Ref Range    Glucose (POC) 131 (H) 65 - 105 mg/dL   LACTIC ACID    Collection Time: 09/10/17  2:50 PM    Result Value Ref Range    Lactic Acid 1.7 0.4 - 2.0 mmol/L       Problem List:  Problem List as of 09/10/2017 Date Reviewed: 09-15-17          Codes Class Noted - Resolved    Hyperkalemia ICD-10-CM: E87.5  ICD-9-CM: 276.7  09/15/2017 - Present        Obtunded ICD-10-CM: R40.1  ICD-9-CM: 780.09  09/15/2017 - Present        Acute renal failure (ARF) (HCC) ICD-10-CM: N17.9  ICD-9-CM: 584.9  2017-09-15 - Present        Septic shock (HCC) ICD-10-CM: A41.9, R65.21  ICD-9-CM: 038.9, 785.52, 995.92  Sep 15, 2017 - Present        Metabolic encephalopathy ICD-10-CM: G93.41  ICD-9-CM: 348.31  September 15, 2017 - Present        Dehydration ICD-10-CM: E86.0  ICD-9-CM: 276.51  08/07/2017 - Present        COPD with acute exacerbation (HCC) ICD-10-CM: J44.1  ICD-9-CM: 491.21  08/07/2017 - Present        Acute-on-chronic kidney injury (HCC) ICD-10-CM: N17.9, N18.9  ICD-9-CM: 584.9, 585.9  08/07/2017 - Present        Chest pain ICD-10-CM: R07.9  ICD-9-CM: 786.50  06/15/2017 - Present        Dyspnea ICD-10-CM: R06.00  ICD-9-CM: 786.09  06/15/2017 - Present        Type 2 diabetes mellitus with diabetic neuropathy (HCC) ICD-10-CM: E11.40  ICD-9-CM: 250.60, 357.2  12/07/2016 - Present        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9  ICD-9-CM: 038.9, 995.91  07/24/2016 - Present        Asthma exacerbation ICD-10-CM: J45.901  ICD-9-CM: 493.92  07/24/2016 - Present         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: E11.21  ICD-9-CM: 250.40, 583.81  06/12/2016 - Present        Sacroiliitis (HCC) ICD-10-CM: M46.1  ICD-9-CM: 720.2  11/25/2015 - Present        Essential hypertension ICD-10-CM: I10  ICD-9-CM: 401.9  10/06/2015 - Present  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  ICD-9-CM: 721.3  09/15/2015 - Present        Lumbar and sacral osteoarthritis ICD-10-CM: M47.817  ICD-9-CM: 721.3  09/15/2015 - Present        Chronic pain syndrome ICD-10-CM: G89.4  ICD-9-CM: 338.4  09/15/2015 - Present        Type 2 diabetes mellitus with hyperglycemia, without long-term current use of insulin (HCC) ICD-10-CM: E11.65  ICD-9-CM: 250.00, 790.29  08/20/2015 - Present        Acute colitis ICD-10-CM: K52.9  ICD-9-CM: 558.9  07/02/2015 - Present        Acute hyperglycemia ICD-10-CM: R73.9  ICD-9-CM: 790.29  07/02/2015 - Present        Accelerated hypertension ICD-10-CM: I10  ICD-9-CM: 401.0  04/08/2015 - Present        Severe headache ICD-10-CM: R51  ICD-9-CM: 784.0  04/07/2015 - Present        Osteoarthritis of hips, 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  ICD-9-CM: 715.95  01/29/2015 - Present        PTSD (post-traumatic stress disorder) (Chronic) ICD-10-CM: F43.10  ICD-9-CM: 309.81  01/29/2015 - Present        Severe hypertension (Chronic) ICD-10-CM: I10  ICD-9-CM: 401.9  07/21/2014 - Present        RESOLVED: New onset type 1 diabetes mellitus, uncontrolled (HCC) ICD-10-CM: E10.65  ICD-9-CM: 250.03  07/02/2015 - 10/06/2015        RESOLVED: Recurrent major depressive disorder, in full remission (HCC) ICD-10-CM: F33.42  ICD-9-CM: 296.36  12/02/2014 - 12/02/2014        RESOLVED: Foreign body in colon ICD-10-CM: T18.4XXA  ICD-9-CM: 936  08/14/2014 - 01/29/2015        RESOLVED: Advanced care planning/counseling discussion ICD-10-CM: Z61.09  ICD-9-CM: V65.49  07/21/2014 - 01/29/2015    Overview Signed 07/21/2014  3:46 PM by Littie Deeds, MD      Patient given [place] application               RESOLVED: Hip pain, chronic ICD-10-CM: M25.559, G89.29  ICD-9-CM: 719.45, 338.29  07/21/2014 - 01/29/2015               Medications reviewed  Current Facility-Administered Medications   Medication Dose Route Frequency   ??? 0.45% sodium chloride with KCl 20 mEq/L infusion   IntraVENous CONTINUOUS   ??? cefTRIAXone (ROCEPHIN) 1 g in 0.9% sodium chloride (MBP/ADV) 50 mL MBP  1 g IntraVENous Q24H   ??? morphine injection 2 mg  2 mg IntraVENous Q4H PRN   ??? dextrose (D50) infusion 5-25 g  10-50 mL IntraVENous PRN   ??? glucagon (GLUCAGEN) injection 1 mg  1 mg IntraMUSCular PRN   ??? insulin lispro (HUMALOG) injection 1-100 Units  1-100 Units SubCUTAneous AC&HS   ??? insulin lispro (HUMALOG) injection 1-100 Units  1-100 Units SubCUTAneous PRN   ??? budesonide (PULMICORT) 500 mcg/2 ml nebulizer suspension  500 mcg Nebulization BID RT   ??? dextrose (D50W) injection syrg 25 g  25 g IntraVENous PRN   ??? montelukast (SINGULAIR) tablet 10 mg  10 mg Oral QHS   ??? rosuvastatin (CRESTOR) tablet 5 mg  5 mg Oral QHS   ??? tiotropium bromide (SPIRIVA RESPIMAT) 2.5 mcg /actuation  1 Puff Inhalation DAILY   ??? white petrolatum-mineral oil (GENTEAL PM) 85-15 % ophthalmic ointment 1 Each  1 Each Both Eyes DAILY   ??? fentaNYL citrate (PF) injection 100-200 mcg  100-200 mcg IntraVENous Q1H PRN   ???  naloxone (NARCAN) injection 0.1 mg  0.1 mg IntraVENous PRN   ??? acetaminophen (TYLENOL) tablet 650 mg  650 mg Oral Q4H PRN    Or   ??? acetaminophen (TYLENOL) solution 650 mg  650 mg Oral Q4H PRN    Or   ??? acetaminophen (TYLENOL) suppository 650 mg  650 mg Rectal Q4H PRN   ??? ondansetron (ZOFRAN) injection 4 mg  4 mg IntraVENous Q4H PRN   ??? albuterol-ipratropium (DUO-NEB) 2.5 MG-0.5 MG/3 ML  3 mL Nebulization Q6H RT   ??? heparin (porcine) injection 5,000 Units  5,000 Units SubCUTAneous Q8H   ??? insulin glargine (LANTUS) injection 1-100 Units  1-100 Units SubCUTAneous QHS    ??? albuterol-ipratropium (DUO-NEB) 2.5 MG-0.5 MG/3 ML  3 mL Nebulization Q4H PRN   ??? pantoprazole (PROTONIX) 40 mg in sodium chloride 0.9% 10 mL injection  40 mg IntraVENous DAILY   ??? heparin (porcine) pf 50 Units  50 Units InterCATHeter DAILY   ??? heparin (porcine) pf 50 Units  50 Units InterCATHeter PRN   ??? sodium chloride (NS) flush 10 mL  10 mL InterCATHeter DAILY   ??? sodium chloride (NS) flush 10 mL  10 mL InterCATHeter PRN        Care Plan discussed with: Patient/Family and Nurse    Total time spent with patient: 28 minutes.    Scarlette Ar, MD  September 10, 2017  16:45

## 2017-09-10 NOTE — Progress Notes (Signed)
Problem: Falls - Risk of  Goal: *Absence of Falls  Description  Document Schmid Fall Risk and appropriate interventions in the flowsheet.  Outcome: Progressing Towards Goal

## 2017-09-10 NOTE — Other (Signed)
Bedside and Verbal shift change report given to Emily A Houle  (oncoming nurse) by Melissa (offgoing nurse). Report included the following information SBAR and Kardex.

## 2017-09-10 NOTE — Progress Notes (Signed)
RENAL PROGRESS NOTE        [name] [name]         Assessment/Plan:       AKI: Nonoliguric. Increasing urine output with creat decreasing. Likely no further HD. Look to d/c uldall in am.    HyperK??and Met Acidosis: addressed with HD.    Septic Shock: UTI. keep MAP >65 mmHg. Wean off pressors.    HAGMA/Lactic Acidosis: resolved.     Resp failure: extubated. No need for diuretics, will add some IVF.                                                                                                                                       Subjective:Patient complaints of: No complaints.   No SOB/CP/N/V.      Patient Active Problem List   Diagnosis Code   ??? Severe hypertension I10   ??? 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 PTSD (post-traumatic stress disorder) F43.10   ??? Severe headache R51   ??? Accelerated hypertension I10   ??? Acute colitis K52.9   ??? Acute hyperglycemia R73.9   ??? Type 2 diabetes mellitus with hyperglycemia, without long-term current use of insulin (HCC) E11.65   ??? 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 Lumbar and sacral osteoarthritis M47.817   ??? Chronic pain syndrome G89.4   ??? Essential hypertension I10   ??? Sacroiliitis (HCC) M46.1   ??? 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 Lactic acidosis E87.2   ??? Leukocytosis D72.829   ??? Sepsis (HCC) A41.9   ??? Asthma exacerbation J45.901   ??? Asthma with acute exacerbation J45.901   ??? Gastritis K29.70   ??? Nausea & vomiting R11.2   ??? 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 Cannabinoid hyperemesis syndrome (HCC) F12.988   ??? 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 Chest pain R07.9   ??? Dyspnea R06.00   ??? Dehydration E86.0   ??? COPD with acute exacerbation ([place]) J44.1   ??? Acute-on-chronic kidney injury ([place]) N17.9, N18.9   ??? Hyperkalemia E87.5   ??? Obtunded R40.1   ??? Acute renal failure (ARF) (HCC) N17.9   ??? Septic shock (HCC) A41.9, X32.44   ??? Metabolic encephalopathy W10.27        Current Facility-Administered Medications   Medication Dose Route Frequency Provider Last Rate Last Dose   ??? morphine injection 2 mg  2 mg IntraVENous Q4H PRN Marzella Schlein, MD       ??? dextrose (D50) infusion 5-25 g  10-50 mL IntraVENous PRN Marzella Schlein, MD       ??? glucagon (GLUCAGEN) injection 1 mg  1 mg IntraMUSCular PRN Marzella Schlein, MD       ??? insulin lispro (HUMALOG) injection 1-100 Units  1-100 Units SubCUTAneous AC&HS Marzella Schlein, MD   4 Units at 09/10/17 0930   ???  insulin lispro (HUMALOG) injection 1-100 Units  1-100 Units SubCUTAneous PRN Marzella Schlein, MD       ??? budesonide (PULMICORT) 500 mcg/2 ml nebulizer suspension  500 mcg Nebulization BID RT Donavan Foil, MD   500 mcg at 09/10/17 0756   ??? piperacillin-tazobactam (ZOSYN) 3.375 g in 0.9% sodium chloride (MBP/ADV) 100 mL MBP  3.375 g IntraVENous Q12H Marzella Schlein, MD 25 mL/hr at 09/10/17 0901 3.375 g at 09/10/17 0901   ??? dextrose (D50W) injection syrg 25 g  25 g IntraVENous PRN Marzella Schlein, MD       ??? montelukast (SINGULAIR) tablet 10 mg  10 mg Oral QHS Marzella Schlein, MD   10 mg at 09/09/17 2200   ??? rosuvastatin (CRESTOR) tablet 5 mg  5 mg Oral QHS Marzella Schlein, MD   5 mg at 09/09/17 2200   ??? tiotropium bromide (SPIRIVA RESPIMAT) 2.5 mcg /actuation  1 Puff Inhalation DAILY Marzella Schlein, MD   1 Puff at 09/10/17 [phone]   ??? white petrolatum-mineral oil (GENTEAL PM) 85-15 % ophthalmic ointment 1 Each  1 Each Both Eyes DAILY Serita Sheller, MD   Stopped at 09/10/17 0900   ??? midazolam in normal saline (VERSED) 1 mg/mL infusion  1-15 mg/hr IntraVENous TITRATE Serita Sheller, MD       ??? fentaNYL citrate (PF) injection 100-200 mcg  100-200 mcg IntraVENous Q1H PRN Serita Sheller, MD       ??? NOREPINephrine (LEVOPHED) 8 mg in 0.9% NS 218m infusion  2-16 mcg/min IntraVENous TITRATE NSerita Sheller MD   Stopped at 09/09/17 0037   ??? naloxone (NARCAN) injection 0.1 mg  0.1 mg IntraVENous PRN CMarzella Schlein MD       ??? acetaminophen (TYLENOL) tablet 650 mg  650 mg Oral Q4H PRN CMarzella Schlein MD        Or   ??? acetaminophen (TYLENOL) solution 650 mg  650 mg Oral Q4H PRN CMarzella Schlein MD        Or   ??? acetaminophen (TYLENOL) suppository 650 mg  650 mg Rectal Q4H PRN CMarzella Schlein MD       ??? ondansetron (ZOFRAN) injection 4 mg  4 mg IntraVENous Q4H PRN CMarzella Schlein MD       ??? albuterol-ipratropium (DUO-NEB) 2.5 MG-0.5 MG/3 ML  3 mL Nebulization Q6H RT CMarzella Schlein MD   3 mL at 09/10/17 0756   ??? heparin (porcine) injection 5,000 Units  5,000 Units SubCUTAneous Q8H CMarzella Schlein MD   5,000 Units at 09/10/17 [phone]  ??? insulin glargine (LANTUS) injection 1-100 Units  1-100 Units SubCUTAneous QHS CMarzella Schlein MD   17 Units at 09/09/17 2221   ??? albuterol-ipratropium (DUO-NEB) 2.5 MG-0.5 MG/3 ML  3 mL Nebulization Q4H PRN CMarzella Schlein MD       ??? pantoprazole (PROTONIX) 40 mg in sodium chloride 0.9% 10 mL injection  40 mg IntraVENous DAILY CMarzella Schlein MD   40 mg at 09/10/17 05409  ??? propofol (DIPRIVAN) infusion  0-50 mcg/kg/min IntraVENous TITRATE PDonavan Foil MD   Stopped at 09/09/17 1308   ??? heparin (porcine) pf 50 Units  50 Units InterCATHeter DAILY CMarzella Schlein MD   50 Units at 09/10/17 0902   ??? heparin (porcine) pf 50 Units  50 Units InterCATHeter PRN CMarzella Schlein MD       ??? sodium chloride (NS) flush 10 mL  10 mL InterCATHeter DAILY CMarzella Schlein MD   Stopped at 09/10/17 1000   ??? sodium chloride (  NS) flush 10 mL  10 mL InterCATHeter PRN Marzella Schlein, MD           Objective  Vitals:    09/10/17 0803 09/10/17 0907 09/10/17 1002 09/10/17 1139   BP: (!) 148/102 (!) 144/98 139/88    Pulse: (!) 101 (!) 108 (!) 109    Resp: 13 12 15     Temp: 98.6 ??F (37 ??C)   98.8 ??F (37.1 ??C)   SpO2: 95% 97% 95%    Weight:       Height:             Intake/Output Summary (Last 24 hours) at 09/10/2017 1153  Last data filed at 09/10/2017 1002  Gross per 24 hour   Intake 1932.59 ml    Output 4506 ml   Net -2573.41 ml           Admission weight: Weight: 90.7 kg (200 lb) (09/08/17 0844)  Last Weight Metrics:  Weight Loss Metrics 09/09/2017 08/29/2017 08/09/2017 07/13/2017 06/18/2017 05/02/2017 02/22/2017   Today's Wt 223 lb 12.3 oz 200 lb 201 lb 11.5 oz 205 lb 195 lb 5.2 oz 200 lb 3.2 oz 187 lb   BMI 34.02 kg/m2 30.41 kg/m2 30.67 kg/m2 31.17 kg/m2 29.7 kg/m2 30.44 kg/m2 28.43 kg/m2           Physical Assessment:     General: NAD, alert and oriented.  Neck: No jvd.  LUNGS: Clear to Auscultation, No rales, rhonchi or wheezes.  CVS EXM: S1, S2  RRR, no murmurs/gallops/rubs.  Abdomen: soft, non tender.   Lower Extremities:  no edema.       Lab    CBC w/Diff Recent Labs     09/10/17  0405 09/09/17  0005 09/08/17  1356 09/08/17  0930   WBC 10.5 12.4*  --  15.1*   RBC 3.59* 3.38*  --  3.67   HGB 10.4* 9.9* 10.9* 10.8*   HCT 31.6* 28.4* 32* 32.9*   PLT 186 171  --  212   GRANS 70.9* 83.2*  --  92.1*   LYMPH 18.3* 9.5*  --  4.6*   EOS 0.2 0.0  --  0.0        Chemistry Recent Labs     09/10/17  0405 09/09/17  0800 09/09/17  0545  09/09/17  0005  09/08/17  0930   GLU 181* 217*  --   --  267*   < > 291*   NA 141 135*  --   --  136   < > 127*   K 3.7 4.9  --   --  5.4*   < > 8.0*   CL 99 92*  --   --  95*   < > 92*   CO2 35* 33* 34.6*   < > 24   < > 15*   BUN 31* 32*  --   --  30*   < > 67*   CREA 3.9* 4.3*  --   --  4.2*   < > 7.6*   CA 8.3* 7.3*  --   --  7.5*   < > 8.0*   AGAP 7 10  --   --  17*   < > 19*   AP  --  53  --   --  57  --  69   TP  --  5.8*  --   --  6.1*  --  6.8   ALB  --  2.4*  --   --  2.6*  --  3.1*   PHOS  --  3.5  --   --   --   --   --     < > = values in this interval not displayed.         No results found for: IRON, FE, TIBC, IBCT, PSAT, FERR   Lab Results   Component Value Date/Time    Calcium 8.3 (L) 09/10/2017 04:05 AM    CALCIUM,IONIZED 3.80 (L) 09/08/2017 01:56 PM    Phosphorus 3.5 09/09/2017 08:00 AM        Clearence Cheek, MD  [facility]  Pager: [phone]

## 2017-09-10 NOTE — Progress Notes (Signed)
[facility] RN discussed pt. Reason for admission, PMH, alleriges, code status, current labs, pt. Assessment and plan of care. RN to transfer pt. To rm [id number]. RN attempted to contact pt. Husband, husband did not answer phone.
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
## 2017-09-11 LAB — CBC WITH AUTOMATED DIFF
BASOPHILS: 0.1 % (ref 0–3)
EOSINOPHILS: 0.4 % (ref 0–5)
HCT: 34.3 % — ABNORMAL LOW (ref 37.0–50.0)
HGB: 11.1 gm/dl — ABNORMAL LOW (ref 13.0–17.2)
IMMATURE GRANULOCYTES: 0.3 % (ref 0.0–3.0)
LYMPHOCYTES: 19.7 % — ABNORMAL LOW (ref 28–48)
MCH: 28.8 pg (ref 25.4–34.6)
MCHC: 32.4 gm/dl (ref 30.0–36.0)
MCV: 89.1 fL (ref 80.0–98.0)
MONOCYTES: 9.6 % (ref 1–13)
MPV: 10.6 fL — ABNORMAL HIGH (ref 6.0–10.0)
NEUTROPHILS: 69.9 % — ABNORMAL HIGH (ref 34–64)
NRBC: 0 (ref 0–0)
PLATELET: 204 10*3/uL (ref 140–450)
RBC: 3.85 M/uL (ref 3.60–5.20)
RDW-SD: 43.3 (ref 36.4–46.3)
WBC: 11.2 10*3/uL — ABNORMAL HIGH (ref 4.0–11.0)

## 2017-09-11 LAB — METABOLIC PANEL, BASIC
Anion gap: 7 mmol/L (ref 5–15)
BUN: 20 mg/dl (ref 7–25)
CO2: 30 mEq/L (ref 21–32)
Calcium: 8.2 mg/dl — ABNORMAL LOW (ref 8.5–10.1)
Chloride: 102 mEq/L (ref 98–107)
Creatinine: 2.1 mg/dl — ABNORMAL HIGH (ref 0.6–1.3)
GFR est AA: 30
GFR est non-AA: 25
Glucose: 157 mg/dl — ABNORMAL HIGH (ref 74–106)
Potassium: 3.4 mEq/L — ABNORMAL LOW (ref 3.5–5.1)
Sodium: 139 mEq/L (ref 136–145)

## 2017-09-11 LAB — CULTURE, RESPIRATORY/SPUTUM/BRONCH W GRAM STAIN

## 2017-09-11 LAB — GLUCOSE, POC
Glucose (POC): 151 mg/dL — ABNORMAL HIGH (ref 65–105)
Glucose (POC): 144 mg/dL — ABNORMAL HIGH (ref 65–105)
Glucose (POC): 181 mg/dL — ABNORMAL HIGH (ref 65–105)
Glucose (POC): 193 mg/dL — ABNORMAL HIGH (ref 65–105)

## 2017-09-11 MED ORDER — PANTOPRAZOLE 40 MG TAB, DELAYED RELEASE
40 mg | Freq: Every day | ORAL | Status: DC
Start: 2017-09-11 — End: 2017-09-14
  Administered 2017-09-12 – 2017-09-14 (×3): via ORAL

## 2017-09-11 MED FILL — 1/2 NS WITH POTASSIUM CHLORIDE 20 MEQ/L IV: 20 mEq/L | INTRAVENOUS | Qty: 1000

## 2017-09-11 MED FILL — IPRATROPIUM-ALBUTEROL 2.5 MG-0.5 MG/3 ML NEB SOLUTION: 2.5 mg-0.5 mg/3 ml | RESPIRATORY_TRACT | Qty: 3

## 2017-09-11 MED FILL — BUDESONIDE 0.5 MG/2 ML NEB SUSPENSION: 0.5 mg/2 mL | RESPIRATORY_TRACT | Qty: 1

## 2017-09-11 MED FILL — HEPARIN (PORCINE) 5,000 UNIT/ML IJ SOLN: 5000 unit/mL | INTRAMUSCULAR | Qty: 1

## 2017-09-11 MED FILL — PANTOPRAZOLE 40 MG IV SOLR: 40 mg | INTRAVENOUS | Qty: 40

## 2017-09-11 MED FILL — HEPARIN, PORCINE (PF) 10 UNIT/ML IV SYRINGE: 10 unit/mL | INTRAVENOUS | Qty: 5

## 2017-09-11 MED FILL — CEFTRIAXONE 1 GRAM SOLUTION FOR INJECTION: 1 gram | INTRAMUSCULAR | Qty: 1

## 2017-09-11 MED FILL — ROSUVASTATIN 10 MG TAB: 10 mg | ORAL | Qty: 1

## 2017-09-11 MED FILL — MONTELUKAST 10 MG TAB: 10 mg | ORAL | Qty: 1

## 2017-09-11 MED FILL — BD POSIFLUSH NORMAL SALINE 0.9 % INJECTION SYRINGE: INTRAMUSCULAR | Qty: 10

## 2017-09-11 NOTE — Progress Notes (Signed)
INTERNAL MEDICINE                                                                   Daily Progress Note  Patient:  [name] [name]  Today date: September 11, 2017  Date of Admission:  09/08/2017    Interval History and Events of the last 24 hours:    Reports improved SOB. No Nausea and vomiting, afebrile  ??  General ROS: (-) for fever, chills  Respiratory ROS: Improvng cough, shortness of breath, wheezing  Cardiovascular ROS: Improved chest pain, swelling (-) for palpitations  Gastrointestinal ROS: (+) for n/v PTA-improved, (-) for abdominal pain  Musculoskeletal ROS: (+) for R hip pain-chronic          Assessment/Plan:       Patient seen in follow up for multiple medical problems as listed below :  1.??Sepsis due to UTI-POA:??  -WBC normalizing.    -UCx reports E.coli   -Continue  rocephin.  -Blood Cxs with ngtd @24  hours. Sputums Cx with only normal flora so far  -Procal of 0.38   -off Levophed.  ??  2. ??Metabolic encephalopathy: Resolved  -Favor Sepsis over medication effect at this point  -Orients x3.  -Did not respond to narcan in ED.  -NH3 and TSH normal.  -No acute findings reported on head CT. ??  -UDS is positive only for marijuana. ??  -Serum alcohol level negative, negative acetaminophen level, salicylate level is not elevated. ??  ??  3.????Intubation for airway protection on intake, Extubated on 3/30:   Wean O2 as tolerated  ??  4. Hyperkalemia: Resolved  -Dialyzed on 3/29-PM after K+ did not improve with medical mgmt.  -Monitor with renal failure  -nephrology following-has ordered 0.45% NS with 45mq of K+  ??  5. 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  -DDX-NSAID use, N/V, Medications-home Cozaar, Aldactone.  -Urine output  increasing  -Strict I/O.??  -IVF per nephro  -Udall Cath removed   ??  ??  6. UTI due to E.coli:??   -Possible source for #1,   -UCx as above, Abx as above  ??  7. ??COPD:   -Frequent nebs  -continue singulair.  -Pulm has changed Breo to pulmicort.  -Monitor s/p Extubation.  ??  8.??GERD:??  -Protonix for GI prophylaxis.  ??  9. Chronic R hip pain:   -Hold nabumetone, gabapentin. ??  -hold Seroquel and Trazodone until sure mentation stablizies.  -Fall reported on AM of 3/31-assisted down per nursing  -PT  ??  10. Hep C: Chronic  ??  .  11. HTN-chornic, but recently off pressors  -holding home meds including Aldactone, Losartan, Norvasc, Clonidine 0.3 BID.  -if needed, consider norvasc first.  ??  13. DM2:  -Glucommander to PO regimen  -A1c of 9.5 in 07/2017  ??  14. HLD  -continue Crestor  ??  PPX: Heparin TID  ??  Dispo: Continue to monitor renal function and urine output.  F/up recs from nephro. F/up Blood Cxs. Therapy consults. Pulm has written orders for Medical bed.  ??        DVT  Prophylaxis:    Code Status:  .Full Code     Disposition and Family:   Recommend to continue hospitalization. Discussed with patient and nursing staff.    Anticipated Date of Discharge: TBD  Anticipated Disposition (home, SNF) : HH    Labwork and Ancillary Studies     All labs/tests/imaging reviewed.Spoke with the nurse regarding patient issues.    Labwork:    CBC w/Diff   Lab Results   Component Value Date/Time    WBC 11.2 (H) 09/11/2017 05:40 AM    HGB 11.1 (L) 09/11/2017 05:40 AM    HCT 34.3 (L) 09/11/2017 05:40 AM    PLATELET 204 09/11/2017 05:40 AM    MCV 89.1 09/11/2017 05:40 AM        Basic Metabolic Profile   Lab Results   Component Value Date/Time    Sodium 139 09/11/2017 05:40 AM    Potassium 3.4 (L) 09/11/2017 05:40 AM    Chloride 102 09/11/2017 05:40 AM    CO2 30 09/11/2017 05:40 AM    Anion gap 7 09/11/2017 05:40 AM    Glucose 157 (H) 09/11/2017 05:40 AM    BUN 20 09/11/2017 05:40 AM    Creatinine 2.1 (H) 09/11/2017 05:40 AM    BUN/Creatinine ratio 24 (H) 06/10/2016 01:00 PM    GFR est AA 30.0 09/11/2017 05:40 AM     GFR est non-AA 25 09/11/2017 05:40 AM    Calcium 8.2 (L) 09/11/2017 05:40 AM        Cardiac Enzymes   Lab Results   Component Value Date/Time    CK 165 09/08/2017 09:30 AM    CK-MB Index 0.4 09/08/2017 09:30 AM    Troponin-I <0.015 09/08/2017 09:30 AM      Arterial Blood Gases   No results found for: PH, PHI, PCO2, PCO2I, PO2, PO2I, HCO3, HCO3I, FIO2, FIO2I   Coagulation    No results found for: APTT, PTP, INR   Hepatic Function   Lab Results   Component Value Date/Time    AST (SGOT) 11 (L) 09/09/2017 08:00 AM    Alk. phosphatase 53 09/09/2017 08:00 AM    Bilirubin, direct 0.2 09/09/2017 12:05 AM            XR Results (most recent):  Results from Hospital Encounter encounter on 09/08/17   XR CHEST SNGL V    Narrative INDICATION:  Post intubation       EXAMINATION:  XR CHEST SNGL V    COMPARISON:  09/08/2017    FINDINGS:  The study shows a normal sized heart.. Increased opacity at the right lower lung  field.. Known left-sided lung nodule. Also known multiple bullae. Endotracheal  tube in good position.      Impression IMPRESSION:  1. Developing right lower lobe infiltrate. Endotracheal tube in good position.         MRI Results (most recent):  Results from [facility] encounter on 04/07/15   MRI BRAIN W WO CONT    Narrative CLINICAL HISTORY:  Abnormal CT, meningioma    PROCEDURE: MRI brain without and with contrast    COMPARISON: Head CT dated 04/08/2015    FINDINGS:    There is age-related cerebral atrophy. There is extensive chronic small vessel  ischemic change, advanced for patient age. As noted on CT, there is a enhancing  extra-axial mass in the left CP angle along the petrous temporal bone measuring  10 mm. This is observed on image 9 series 12, image 6 series 11 and image  21  series 13. There are dural tails extending to the tentorium cerebelli. Imaging  findings would favor meningioma. No other areas of abnormal brain parenchymal  enhancement. The sella, pineal region, craniocervical junction are  unremarkable.  Skull base flow voids are patent.      Impression IMPRESSION:  1. Enhancing mass in the left posterior fossa adjacent to the petrous temporal  bone as described above. Main consideration would include meningioma. Other less  likely differential considerations would include dural metastasis and lymphoma.  Consider surveillance or further evaluation.    2.  Chronic small vessel ischemic changes, advanced for patient age.         CT Results (most recent):  Results from Hospital Encounter encounter on 09/08/17   CT HEAD WO CONT    Narrative INDICATION:  Altered mental status.        COMPARISON:  None    TECHNIQUE:  The CT scan of the head was performed without contrast. Coronal and sagittal  reconstructions were obtained.  DICOM format image data is available to non-affiliated external healthcare  facilities or entities on a secure, media free, reciprocally searchable basis  with patient authorization for 12 months following the date of the study.    FINDINGS:  The ventricular system shows normal dimensions and morphology. The white and  gray matter is well differentiated. Periventricular confluent white matter  hypoattenuation. Rounded 11.6 mm hypodensity in the left posterior fossa  adjacent to the left petrous bone and the tentorium suggesting meningioma. This  is unchanged. There is no intracranial bleed or evidence of an acute infarct.   No identification of a fracture. Mild ethmoid sinusitis.             Impression IMPRESSION:   1. Chronic microvascular white matter ischemic disease.  2. Small unchanged rounded hyperdense lesion in the left posterior fossa  adjacent to the petrous bone and tentorium likely meningioma.  3. Mild ethmoid sinusitis.                Current Inpatient Meds and Allergies     Medications:  Current Facility-Administered Medications   Medication Dose Route Frequency   ??? 0.45% sodium chloride with KCl 20 mEq/L infusion   IntraVENous CONTINUOUS    ??? cefTRIAXone (ROCEPHIN) 1 g in 0.9% sodium chloride (MBP/ADV) 50 mL MBP  1 g IntraVENous Q24H   ??? morphine injection 2 mg  2 mg IntraVENous Q4H PRN   ??? dextrose (D50) infusion 5-25 g  10-50 mL IntraVENous PRN   ??? glucagon (GLUCAGEN) injection 1 mg  1 mg IntraMUSCular PRN   ??? insulin lispro (HUMALOG) injection 1-100 Units  1-100 Units SubCUTAneous AC&HS   ??? insulin lispro (HUMALOG) injection 1-100 Units  1-100 Units SubCUTAneous PRN   ??? budesonide (PULMICORT) 500 mcg/2 ml nebulizer suspension  500 mcg Nebulization BID RT   ??? dextrose (D50W) injection syrg 25 g  25 g IntraVENous PRN   ??? montelukast (SINGULAIR) tablet 10 mg  10 mg Oral QHS   ??? rosuvastatin (CRESTOR) tablet 5 mg  5 mg Oral QHS   ??? tiotropium bromide (SPIRIVA RESPIMAT) 2.5 mcg /actuation  1 Puff Inhalation DAILY   ??? white petrolatum-mineral oil (GENTEAL PM) 85-15 % ophthalmic ointment 1 Each  1 Each Both Eyes DAILY   ??? fentaNYL citrate (PF) injection 100-200 mcg  100-200 mcg IntraVENous Q1H PRN   ??? naloxone (NARCAN) injection 0.1 mg  0.1 mg IntraVENous PRN   ??? acetaminophen (TYLENOL) tablet 650 mg  650 mg Oral Q4H PRN    Or   ??? acetaminophen (TYLENOL) solution 650 mg  650 mg Oral Q4H PRN    Or   ??? acetaminophen (TYLENOL) suppository 650 mg  650 mg Rectal Q4H PRN   ??? ondansetron (ZOFRAN) injection 4 mg  4 mg IntraVENous Q4H PRN   ??? albuterol-ipratropium (DUO-NEB) 2.5 MG-0.5 MG/3 ML  3 mL Nebulization Q6H RT   ??? heparin (porcine) injection 5,000 Units  5,000 Units SubCUTAneous Q8H   ??? insulin glargine (LANTUS) injection 1-100 Units  1-100 Units SubCUTAneous QHS   ??? albuterol-ipratropium (DUO-NEB) 2.5 MG-0.5 MG/3 ML  3 mL Nebulization Q4H PRN   ??? pantoprazole (PROTONIX) 40 mg in sodium chloride 0.9% 10 mL injection  40 mg IntraVENous DAILY   ??? heparin (porcine) pf 50 Units  50 Units InterCATHeter DAILY   ??? heparin (porcine) pf 50 Units  50 Units InterCATHeter PRN   ??? sodium chloride (NS) flush 10 mL  10 mL InterCATHeter DAILY    ??? sodium chloride (NS) flush 10 mL  10 mL InterCATHeter PRN          Objective:       Visit Vitals  BP (!) 118/99 (BP 1 Location: Right arm, BP Patient Position: Supine)   Pulse (!) 108   Temp 99.3 ??F (37.4 ??C)   Resp 20   Ht 5' 8"  (1.727 m)   Wt 101.5 kg (223 lb 12.3 oz)   LMP  (Exact Date)   SpO2 95%   Breastfeeding? No   BMI 34.02 kg/m??     Body mass index is 34.02 kg/m??.      Intake/Output Summary (Last 24 hours) at 09/11/2017 1232  Last data filed at 09/11/2017 1107  Gross per 24 hour   Intake 2833.75 ml   Output 5215 ml   Net -2381.25 ml       General: ?? Alert, cooperative, no distress, appears stated age.??   Lungs:?? ?? Clear, no  wheeze, rhonchi, rales   Chest wall: ?? No tenderness or deformity.??   Heart: ?? Regular rate and rhythm, S1, S2 normal, no murmur, click, rub or gallop.??   Abdomen:?? ?? Soft, non-tender. Bowel sounds normal. No masses,?? No organomegaly.??       Musculoskeleta : Normal range of motion in most of the joints   Extremities:?? Extremities normal, atraumatic, no cyanosis or edema.??   Pulses:?? 2+ and symmetric all extremities.??   Skin:?? Skin color, texture, turgor normal. No rashes or lesions??   Neurologic  Psych:?? : CNII-XII intact.    Normal affect and mood . No thoughts of harm to self or others??             Total time spent with chart review, patient examination/education, discussion with staff on case,documentation and medication management / adjustment:   >35 minutes    It is always a pleasure to be involved in the clinical care of this patient.    [name] Kluver, MD  September 11, 2017  [facility]  Pager:  [phone]

## 2017-09-11 NOTE — Other (Signed)
Right groin dialysis catheter site bleeding , as removed by  Vascular PA Dana , pressure dressing applied to the site.

## 2017-09-11 NOTE — Other (Signed)
Bedside shift change report given to Latrelle DodrillMaria Nena D Snak Rn   (oncoming nurse) by Delsa SaleAmpy Rn (offgoing nurse). Report included the following information SBAR, Kardex, Intake/Output, MAR and Recent Results.

## 2017-09-11 NOTE — Other (Addendum)
Pt Blood pressure 154/113,159/109 ,Pt asymptomatic. Dr Langston MaskerMorris made aware orders received.Clonidine 0.1 mg x1 dose.Will continue to monitor.

## 2017-09-11 NOTE — Progress Notes (Signed)
RENAL PROGRESS NOTE        [name] [name]         Assessment/Plan:       AKI:??Nonoliguric. Good urine output with creat decreasing. No further HD. Pull uldall today. ??  HyperK??and??Met??Acidosis: addressed with HD. ??  Septic Shock:??UTI.??keep MAP >65 mmHg. Wean off pressors.    HAGMA/Lactic Acidosis: resolved.  ??  Resp failure: extubated. Some IVF today.    ??                                                                                                                                     Subjective:Patient complaints of: No complaints.   No SOB/CP/N/V.      Patient Active Problem List   Diagnosis Code   ??? Severe hypertension I10   ??? 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 PTSD (post-traumatic stress disorder) F43.10   ??? Severe headache R51   ??? Accelerated hypertension I10   ??? Acute colitis K52.9   ??? Acute hyperglycemia R73.9   ??? Type 2 diabetes mellitus with hyperglycemia, without long-term current use of insulin (HCC) E11.65   ??? 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 Lumbar and sacral osteoarthritis M47.817   ??? Chronic pain syndrome G89.4   ??? Essential hypertension I10   ??? Sacroiliitis (HCC) M46.1   ??? 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 Lactic acidosis E87.2   ??? Leukocytosis D72.829   ??? Sepsis (HCC) A41.9   ??? Asthma exacerbation J45.901   ??? Asthma with acute exacerbation J45.901   ??? Gastritis K29.70   ??? Nausea & vomiting R11.2   ??? 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 Cannabinoid hyperemesis syndrome (HCC) F12.988   ??? 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 Chest pain R07.9   ??? Dyspnea R06.00   ??? Dehydration E86.0   ??? COPD with acute exacerbation ([place]) J44.1   ??? Acute-on-chronic kidney injury ([place]) N17.9, N18.9   ??? Hyperkalemia E87.5   ??? Obtunded R40.1   ??? Acute renal failure (ARF) (HCC) N17.9   ??? Septic shock (HCC) A41.9, G50.03   ??? Metabolic encephalopathy B04.88       Current Facility-Administered Medications    Medication Dose Route Frequency Provider Last Rate Last Dose   ??? 0.45% sodium chloride with KCl 20 mEq/L infusion   IntraVENous CONTINUOUS Pia Mau, Damani Rando, MD 75 mL/hr at 09/11/17 0616     ??? cefTRIAXone (ROCEPHIN) 1 g in 0.9% sodium chloride (MBP/ADV) 50 mL MBP  1 g IntraVENous Q24H Marzella Schlein, MD 100 mL/hr at 09/10/17 1733 1 g at 09/10/17 1733   ??? morphine injection 2 mg  2 mg IntraVENous Q4H PRN Marzella Schlein, MD       ??? dextrose (D50) infusion 5-25 g  10-50 mL IntraVENous PRN Marzella Schlein, MD       ???  glucagon (GLUCAGEN) injection 1 mg  1 mg IntraMUSCular PRN Marzella Schlein, MD       ??? insulin lispro (HUMALOG) injection 1-100 Units  1-100 Units SubCUTAneous AC&HS Marzella Schlein, MD   2 Units at 09/11/17 [phone]   ??? insulin lispro (HUMALOG) injection 1-100 Units  1-100 Units SubCUTAneous PRN Marzella Schlein, MD       ??? budesonide (PULMICORT) 500 mcg/2 ml nebulizer suspension  500 mcg Nebulization BID RT Donavan Foil, MD   500 mcg at 09/11/17 7414   ??? dextrose (D50W) injection syrg 25 g  25 g IntraVENous PRN Marzella Schlein, MD       ??? montelukast (SINGULAIR) tablet 10 mg  10 mg Oral QHS Marzella Schlein, MD   10 mg at 09/10/17 2135   ??? rosuvastatin (CRESTOR) tablet 5 mg  5 mg Oral QHS Marzella Schlein, MD   5 mg at 09/10/17 2135   ??? tiotropium bromide (SPIRIVA RESPIMAT) 2.5 mcg /actuation  1 Puff Inhalation DAILY Marzella Schlein, MD   1 Puff at 09/11/17 [phone]   ??? white petrolatum-mineral oil (GENTEAL PM) 85-15 % ophthalmic ointment 1 Each  1 Each Both Eyes DAILY Serita Sheller, MD   1 Each at 09/11/17 [phone]   ??? fentaNYL citrate (PF) injection 100-200 mcg  100-200 mcg IntraVENous Q1H PRN Serita Sheller, MD       ??? naloxone (NARCAN) injection 0.1 mg  0.1 mg IntraVENous PRN Marzella Schlein, MD       ??? acetaminophen (TYLENOL) tablet 650 mg  650 mg Oral Q4H PRN Marzella Schlein, MD        Or   ??? acetaminophen (TYLENOL) solution 650 mg  650 mg Oral Q4H PRN Marzella Schlein, MD        Or    ??? acetaminophen (TYLENOL) suppository 650 mg  650 mg Rectal Q4H PRN Marzella Schlein, MD       ??? ondansetron (ZOFRAN) injection 4 mg  4 mg IntraVENous Q4H PRN Marzella Schlein, MD       ??? albuterol-ipratropium (DUO-NEB) 2.5 MG-0.5 MG/3 ML  3 mL Nebulization Q6H RT Marzella Schlein, MD   3 mL at 09/11/17 0748   ??? heparin (porcine) injection 5,000 Units  5,000 Units SubCUTAneous Q8H Marzella Schlein, MD   5,000 Units at 09/11/17 0548   ??? insulin glargine (LANTUS) injection 1-100 Units  1-100 Units SubCUTAneous QHS Marzella Schlein, MD   17 Units at 09/10/17 2133   ??? albuterol-ipratropium (DUO-NEB) 2.5 MG-0.5 MG/3 ML  3 mL Nebulization Q4H PRN Marzella Schlein, MD       ??? pantoprazole (PROTONIX) 40 mg in sodium chloride 0.9% 10 mL injection  40 mg IntraVENous DAILY Marzella Schlein, MD   40 mg at 09/11/17 0941   ??? heparin (porcine) pf 50 Units  50 Units InterCATHeter DAILY Marzella Schlein, MD   50 Units at 09/11/17 [phone]   ??? heparin (porcine) pf 50 Units  50 Units InterCATHeter PRN Marzella Schlein, MD       ??? sodium chloride (NS) flush 10 mL  10 mL InterCATHeter DAILY Marzella Schlein, MD   10 mL at 09/11/17 5686   ??? sodium chloride (NS) flush 10 mL  10 mL InterCATHeter PRN Marzella Schlein, MD           Objective  Vitals:    09/11/17 0723 09/11/17 0748 09/11/17 0943 09/11/17 1107   BP: (!) 142/94  (!) 142/94 (!) 118/99   Pulse: 96  96 (!) 108   Resp: 18  20   Temp: 98.2 ??F (36.8 ??C)   99.3 ??F (37.4 ??C)   SpO2: 97% 96%  95%   Weight:       Height:             Intake/Output Summary (Last 24 hours) at 09/11/2017 1138  Last data filed at 09/11/2017 1107  Gross per 24 hour   Intake 2833.75 ml   Output 5625 ml   Net -2791.25 ml           Admission weight: Weight: 90.7 kg (200 lb) (09/08/17 0844)  Last Weight Metrics:  Weight Loss Metrics 09/09/2017 08/29/2017 08/09/2017 07/13/2017 06/18/2017 05/02/2017 02/22/2017   Today's Wt 223 lb 12.3 oz 200 lb 201 lb 11.5 oz 205 lb 195 lb 5.2 oz 200 lb 3.2 oz 187 lb    BMI 34.02 kg/m2 30.41 kg/m2 30.67 kg/m2 31.17 kg/m2 29.7 kg/m2 30.44 kg/m2 28.43 kg/m2           Physical Assessment:     General: NAD, alert and oriented.  Neck: No jvd.  LUNGS: Clear to Auscultation, No rales, rhonchi or wheezes.  CVS EXM: S1, S2  RRR, no murmurs/gallops/rubs.  Abdomen: soft, non tender.   Lower Extremities:  no edema.       Lab    CBC w/Diff Recent Labs     09/11/17  0540 09/10/17  0405 09/09/17  0005   WBC 11.2* 10.5 12.4*   RBC 3.85 3.59* 3.38*   HGB 11.1* 10.4* 9.9*   HCT 34.3* 31.6* 28.4*   PLT 204 186 171   GRANS 69.9* 70.9* 83.2*   LYMPH 19.7* 18.3* 9.5*   EOS 0.4 0.2 0.0        Chemistry Recent Labs     09/11/17  0540 09/10/17  0405 09/09/17  0800  09/09/17  0005   GLU 157* 181* 217*  --  267*   NA 139 141 135*  --  136   K 3.4* 3.7 4.9  --  5.4*   CL 102 99 92*  --  95*   CO2 30 35* 33*   < > 24   BUN 20 31* 32*  --  30*   CREA 2.1* 3.9* 4.3*  --  4.2*   CA 8.2* 8.3* 7.3*  --  7.5*   AGAP _0 --  17*   AP  --   --  53  --  57   TP  --   --  5.8*  --  6.1*   ALB  --   --  2.4*  --  2.6*   PHOS  --   --  3.5  --   --     < > = values in this interval not displayed.         No results found for: IRON, FE, TIBC, IBCT, PSAT, FERR   Lab Results   Component Value Date/Time    Calcium 8.2 (L) 09/11/2017 05:40 AM    CALCIUM,IONIZED 3.80 (L) 09/08/2017 01:56 PM    Phosphorus 3.5 09/09/2017 08:00 AM        Clearence Cheek, MD  [facility]  Pager: [phone]

## 2017-09-11 NOTE — Progress Notes (Signed)
RCFV temporary HD catheter found intact and in place. RCFV temporary HD catheter gauze/tegaderm/biopatch removed. Sutures removed. RCFV temporary HD catheter removed intact. Manual pressure held over insertion site x195mins. No bleeding or hematoma seen. No erythema or drainage seen. Guaze/tegaderm dressing applied.  ??  Disposition:  Apply manual pressure x235mins if breakthrough bleeding occurs  Remove dressing in 1 day  Will sign off for now    -Bartholome Billana Bettey Muraoka PA-C

## 2017-09-11 NOTE — Other (Signed)
Bedside shift change report given to Ampy (oncoming nurse) by Emily (offgoing nurse). Report included the following information SBAR and Kardex.
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
## 2017-09-11 NOTE — Progress Notes (Signed)
Attempted to see Patient for PT eval. Currently OT in room. Will return as schedule permits.     Fredie Majano, DPT

## 2017-09-11 NOTE — Progress Notes (Addendum)
Patient admitted on 09/08/2017 from home with   Chief Complaint   Patient presents with   ??? Unresponsive        The patient has been admitted to the hospital 2 times in the past 12 months.    Previous 4 Admission Dates Admission and Discharge Diagnosis Interventions Barriers Disposition   2/24-2/27/19 COPD with exac   Home with [facility]   1/3-06/18/17 Chest pain                        Tentative dc plan:    Home with husband and MD follow up. Denies needs for home at this time. PT OT to see will follow and assist with dc needs as appropriate  Facility if plan     Anticipated Discharge Date:   tbd    PCP: [name], PA-C     Specialists:     Nephrology    Face sheet information, address, contact info and insurance verified  yes    Dialysis Unit/ chair time / access:    N/a  Per Nephrology  AKI:??Nonoliguric. Increasing urine output with creat decreasing. Likely no further HD. Look to d/c uldall in am. ??        Pharmacy:   [facility] [address]     DME at home and provider:    See list below    O2 at home  none    Home Environment:    Lives at [address]  [place] [address]    @[email or web address] @.     Prior to admission open services:    Yes in past    Home Health Agency-     [facility] in past    Personal Care Agency-    n/a    Extended Emergency Contact Information  Primary Emergency Contact: Goodwin,Bobby  Address: [address]           [place], [place] [address] [place]  Home Phone: [phone]  Mobile Phone: [phone]  Relation: Spouse      Transportation:     husband will transport home    Therapy Recommendations:  OT :y/to be screened  PT :y/to be screened    Case Management Assessment                          PRIMARY DECISION MAKER    self                               CARE MANAGEMENT INTERVENTIONS   Readmission Interview Completed: Not Applicable   PCP Verified by CM: Yes           Mode of Transport at Discharge: Other (see comment)(husband)        Transition of Care Consult (CM Consult): Discharge Planning               Discharge Durable Medical Equipment: Yes(has tub bench  w/c  cane  walker  lift chair and nebulizer)   Physical Therapy Consult: Yes   Occupational Therapy Consult: Yes   Speech Therapy Consult: No   Current Support Network: Lives with Spouse, Own Home   Reason for Referral: DCP Rounds   History Provided By: Patient, Medical Record   Patient Orientation: Alert and Oriented   Cognition: Alert   Support System Response: Concerned, Cooperative   Previous Living Arrangement: Lives with Family Independent  Home Accessibility: Steps, Multi Level Home(1 into home and 10 to second floor where bedroom is)   Prior Functional Level: Assistance with the following:, Shopping, Cooking, Housework   Current Functional Level: Assistance with the following:, Cooking, Housework, Media plannerhopping   Primary Language: English   Can patient return to prior living arrangement: Yes   Ability to make needs known:: Good   Family able to assist with home care needs:: Yes               Types of Needs Identified: Disease Management Education, Treatment Education   Anticipated Discharge Needs: Home Health Services           Plan discussed with Pt/Family/Caregiver: Yes   Freedom of Choice Offered: Yes      DISCHARGE LOCATION   Discharge Placement: Home with family assistance

## 2017-09-12 LAB — METABOLIC PANEL, BASIC
Anion gap: 8 mmol/L (ref 5–15)
BUN: 12 mg/dl (ref 7–25)
CO2: 22 mEq/L (ref 21–32)
Calcium: 8.1 mg/dl — ABNORMAL LOW (ref 8.5–10.1)
Chloride: 109 mEq/L — ABNORMAL HIGH (ref 98–107)
Creatinine: 1.2 mg/dl (ref 0.6–1.3)
GFR est AA: 58
GFR est non-AA: 48
Glucose: 124 mg/dl — ABNORMAL HIGH (ref 74–106)
Potassium: 3.5 mEq/L (ref 3.5–5.1)
Sodium: 139 mEq/L (ref 136–145)

## 2017-09-12 LAB — GLUCOSE, POC
Glucose (POC): 159 mg/dL — ABNORMAL HIGH (ref 65–105)
Glucose (POC): 123 mg/dL — ABNORMAL HIGH (ref 65–105)
Glucose (POC): 136 mg/dL — ABNORMAL HIGH (ref 65–105)
Glucose (POC): 124 mg/dL — ABNORMAL HIGH (ref 65–105)

## 2017-09-12 MED ORDER — AZITHROMYCIN 250 MG TAB
250 mg | Freq: Every day | ORAL | Status: DC
Start: 2017-09-12 — End: 2017-09-14
  Administered 2017-09-12 – 2017-09-14 (×3): via ORAL

## 2017-09-12 MED ORDER — CLONIDINE 0.1 MG TAB
0.1 mg | Freq: Once | ORAL | Status: AC
Start: 2017-09-12 — End: 2017-09-11
  Administered 2017-09-12: 03:00:00 via ORAL

## 2017-09-12 MED ORDER — ROSUVASTATIN 5 MG TAB
5 mg | ORAL_TABLET | ORAL | 0 refills | Status: DC
Start: 2017-09-12 — End: 2017-09-15

## 2017-09-12 MED ORDER — CLONIDINE 0.2 MG TAB
0.2 mg | Freq: Two times a day (BID) | ORAL | Status: DC
Start: 2017-09-12 — End: 2017-09-14
  Administered 2017-09-13 – 2017-09-14 (×4): via ORAL

## 2017-09-12 MED ORDER — AMLODIPINE 5 MG TAB
5 mg | Freq: Every day | ORAL | Status: DC
Start: 2017-09-12 — End: 2017-09-14
  Administered 2017-09-12 – 2017-09-14 (×3): via ORAL

## 2017-09-12 MED ORDER — LOSARTAN 50 MG TAB
50 mg | Freq: Every day | ORAL | Status: DC
Start: 2017-09-12 — End: 2017-09-14
  Administered 2017-09-12 – 2017-09-14 (×3): via ORAL

## 2017-09-12 MED FILL — LOSARTAN 50 MG TAB: 50 mg | ORAL | Qty: 2

## 2017-09-12 MED FILL — 1/2 NS WITH POTASSIUM CHLORIDE 20 MEQ/L IV: 20 mEq/L | INTRAVENOUS | Qty: 1000

## 2017-09-12 MED FILL — MORPHINE 2 MG/ML INJECTION: 2 mg/mL | INTRAMUSCULAR | Qty: 1

## 2017-09-12 MED FILL — BUDESONIDE 0.5 MG/2 ML NEB SUSPENSION: 0.5 mg/2 mL | RESPIRATORY_TRACT | Qty: 1

## 2017-09-12 MED FILL — HEPARIN, PORCINE (PF) 10 UNIT/ML IV SYRINGE: 10 unit/mL | INTRAVENOUS | Qty: 5

## 2017-09-12 MED FILL — IPRATROPIUM-ALBUTEROL 2.5 MG-0.5 MG/3 ML NEB SOLUTION: 2.5 mg-0.5 mg/3 ml | RESPIRATORY_TRACT | Qty: 3

## 2017-09-12 MED FILL — AZITHROMYCIN 250 MG TAB: 250 mg | ORAL | Qty: 2

## 2017-09-12 MED FILL — BD POSIFLUSH NORMAL SALINE 0.9 % INJECTION SYRINGE: INTRAMUSCULAR | Qty: 10

## 2017-09-12 MED FILL — ROSUVASTATIN 10 MG TAB: 10 mg | ORAL | Qty: 1

## 2017-09-12 MED FILL — CLONIDINE 0.1 MG TAB: 0.1 mg | ORAL | Qty: 1

## 2017-09-12 MED FILL — HEPARIN (PORCINE) 5,000 UNIT/ML IJ SOLN: 5000 unit/mL | INTRAMUSCULAR | Qty: 1

## 2017-09-12 MED FILL — MONTELUKAST 10 MG TAB: 10 mg | ORAL | Qty: 1

## 2017-09-12 MED FILL — AMLODIPINE 5 MG TAB: 5 mg | ORAL | Qty: 2

## 2017-09-12 MED FILL — PANTOPRAZOLE 40 MG TAB, DELAYED RELEASE: 40 mg | ORAL | Qty: 1

## 2017-09-12 MED FILL — CEFTRIAXONE 1 GRAM SOLUTION FOR INJECTION: 1 gram | INTRAMUSCULAR | Qty: 1

## 2017-09-12 NOTE — Progress Notes (Signed)
RENAL PROGRESS NOTE        [name] [name]         Assessment/Plan:       ??  AKI:??Nonoliguric. resolved. Will sign off. ????  HyperK??and??Met??Acidosis: resolved. ??  Septic Shock:??resolved.??????????  Resp failure:??rsolved.   UTI: treated.                                                                                                                                      Subjective:Patient complaints of: No complaints.   No SOB/CP/N/V.      Patient Active Problem List   Diagnosis Code   ??? Severe hypertension I10   ??? 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 PTSD (post-traumatic stress disorder) F43.10   ??? Severe headache R51   ??? Accelerated hypertension I10   ??? Acute colitis K52.9   ??? Acute hyperglycemia R73.9   ??? Type 2 diabetes mellitus with hyperglycemia, without long-term current use of insulin (HCC) E11.65   ??? 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 Lumbar and sacral osteoarthritis M47.817   ??? Chronic pain syndrome G89.4   ??? Essential hypertension I10   ??? Sacroiliitis (HCC) M46.1   ??? 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 Lactic acidosis E87.2   ??? Leukocytosis D72.829   ??? Sepsis (HCC) A41.9   ??? Asthma exacerbation J45.901   ??? Asthma with acute exacerbation J45.901   ??? Gastritis K29.70   ??? Nausea & vomiting R11.2   ??? 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 Cannabinoid hyperemesis syndrome (HCC) F12.988   ??? 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 Chest pain R07.9   ??? Dyspnea R06.00   ??? Dehydration E86.0   ??? COPD with acute exacerbation ([place]) J44.1   ??? Acute-on-chronic kidney injury ([place]) N17.9, N18.9   ??? Hyperkalemia E87.5   ??? Obtunded R40.1   ??? Acute renal failure (ARF) (HCC) N17.9   ??? Septic shock (HCC) A41.9, A35.57   ??? Metabolic encephalopathy D22.02       Current Facility-Administered Medications   Medication Dose Route Frequency Provider Last Rate Last Dose   ??? amLODIPine (NORVASC) tablet 10 mg  10 mg Oral DAILY Otila Kluver, MD    10 mg at 09/12/17 1018   ??? pantoprazole (PROTONIX) tablet 40 mg  40 mg Oral ACB Otila Kluver, MD   40 mg at 09/12/17 0631   ??? 0.45% sodium chloride with KCl 20 mEq/L infusion   IntraVENous CONTINUOUS Clearence Cheek, MD 75 mL/hr at 09/12/17 1015     ??? cefTRIAXone (ROCEPHIN) 1 g in 0.9% sodium chloride (MBP/ADV) 50 mL MBP  1 g IntraVENous Q24H Marzella Schlein, MD 100 mL/hr at 09/11/17 1731 1 g at 09/11/17 1731   ??? morphine injection 2 mg  2 mg IntraVENous Q4H PRN Marzella Schlein, MD   2 mg  at 09/12/17 1610   ??? dextrose (D50) infusion 5-25 g  10-50 mL IntraVENous PRN Marzella Schlein, MD       ??? glucagon (GLUCAGEN) injection 1 mg  1 mg IntraMUSCular PRN Marzella Schlein, MD       ??? insulin lispro (HUMALOG) injection 1-100 Units  1-100 Units SubCUTAneous AC&HS Marzella Schlein, MD   Stopped at 09/12/17 0730   ??? insulin lispro (HUMALOG) injection 1-100 Units  1-100 Units SubCUTAneous PRN Marzella Schlein, MD       ??? budesonide (PULMICORT) 500 mcg/2 ml nebulizer suspension  500 mcg Nebulization BID RT Donavan Foil, MD   500 mcg at 09/12/17 0751   ??? dextrose (D50W) injection syrg 25 g  25 g IntraVENous PRN Marzella Schlein, MD       ??? montelukast (SINGULAIR) tablet 10 mg  10 mg Oral QHS Marzella Schlein, MD   10 mg at 09/11/17 2244   ??? rosuvastatin (CRESTOR) tablet 5 mg  5 mg Oral QHS Marzella Schlein, MD   5 mg at 09/11/17 2244   ??? tiotropium bromide (SPIRIVA RESPIMAT) 2.5 mcg /actuation  1 Puff Inhalation DAILY Marzella Schlein, MD   1 Puff at 09/12/17 1020   ??? white petrolatum-mineral oil (GENTEAL PM) 85-15 % ophthalmic ointment 1 Each  1 Each Both Eyes DAILY Serita Sheller, MD   1 Each at 09/12/17 [phone]   ??? fentaNYL citrate (PF) injection 100-200 mcg  100-200 mcg IntraVENous Q1H PRN Serita Sheller, MD       ??? naloxone (NARCAN) injection 0.1 mg  0.1 mg IntraVENous PRN Marzella Schlein, MD       ??? acetaminophen (TYLENOL) tablet 650 mg  650 mg Oral Q4H PRN Marzella Schlein, MD        Or    ??? acetaminophen (TYLENOL) solution 650 mg  650 mg Oral Q4H PRN Marzella Schlein, MD        Or   ??? acetaminophen (TYLENOL) suppository 650 mg  650 mg Rectal Q4H PRN Marzella Schlein, MD       ??? ondansetron (ZOFRAN) injection 4 mg  4 mg IntraVENous Q4H PRN Marzella Schlein, MD       ??? albuterol-ipratropium (DUO-NEB) 2.5 MG-0.5 MG/3 ML  3 mL Nebulization Q6H RT Marzella Schlein, MD   3 mL at 09/12/17 0751   ??? heparin (porcine) injection 5,000 Units  5,000 Units SubCUTAneous Q8H Marzella Schlein, MD   5,000 Units at 09/12/17 5409   ??? insulin glargine (LANTUS) injection 1-100 Units  1-100 Units SubCUTAneous QHS Marzella Schlein, MD   17 Units at 09/11/17 2239   ??? albuterol-ipratropium (DUO-NEB) 2.5 MG-0.5 MG/3 ML  3 mL Nebulization Q4H PRN Marzella Schlein, MD       ??? heparin (porcine) pf 50 Units  50 Units InterCATHeter DAILY Marzella Schlein, MD   50 Units at 09/12/17 1019   ??? heparin (porcine) pf 50 Units  50 Units InterCATHeter PRN Marzella Schlein, MD       ??? sodium chloride (NS) flush 10 mL  10 mL InterCATHeter DAILY Marzella Schlein, MD   10 mL at 09/12/17 1019   ??? sodium chloride (NS) flush 10 mL  10 mL InterCATHeter PRN Marzella Schlein, MD           Objective  Vitals:    09/12/17 0013 09/12/17 0407 09/12/17 0807 09/12/17 1101   BP:  (!) 135/95 (!) 148/115 (!) 154/102   Pulse:  93 (!) 105 (!) 103   Resp:  18 20 18  Temp:  98.4 ??F (36.9 ??C) 98.3 ??F (36.8 ??C) 99.2 ??F (37.3 ??C)   SpO2: 95% 94% 99% 99%   Weight:       Height:             Intake/Output Summary (Last 24 hours) at 09/12/2017 1111  Last data filed at 09/12/2017 1105  Gross per 24 hour   Intake 3307.9 ml   Output 3400 ml   Net -92.1 ml           Admission weight: Weight: 90.7 kg (200 lb) (09/08/17 0844)  Last Weight Metrics:  Weight Loss Metrics 09/09/2017 08/29/2017 08/09/2017 07/13/2017 06/18/2017 05/02/2017 02/22/2017   Today's Wt 223 lb 12.3 oz 200 lb 201 lb 11.5 oz 205 lb 195 lb 5.2 oz 200 lb 3.2 oz 187 lb    BMI 34.02 kg/m2 30.41 kg/m2 30.67 kg/m2 31.17 kg/m2 29.7 kg/m2 30.44 kg/m2 28.43 kg/m2           Physical Assessment:     General: NAD, alert and oriented.  Neck: No jvd.  LUNGS: Clear to Auscultation, No rales, rhonchi or wheezes.  CVS EXM: S1, S2  RRR, no murmurs/gallops/rubs.  Abdomen: soft, non tender.   Lower Extremities:  no edema.       Lab    CBC w/Diff Recent Labs     09/11/17  0540 09/10/17  0405   WBC 11.2* 10.5   RBC 3.85 3.59*   HGB 11.1* 10.4*   HCT 34.3* 31.6*   PLT 204 186   GRANS 69.9* 70.9*   LYMPH 19.7* 18.3*   EOS 0.4 0.2        Chemistry Recent Labs     09/12/17  0607 09/11/17  0540 09/10/17  0405   GLU 124* 157* 181*   NA 139 139 141   K 3.5 3.4* 3.7   CL 109* 102 99   CO2 22 30 35*   BUN 12 20 31*   CREA 1.2 2.1* 3.9*   CA 8.1* 8.2* 8.3*   AGAP _0 No results found for: IRON, FE, TIBC, IBCT, PSAT, FERR   Lab Results   Component Value Date/Time    Calcium 8.1 (L) 09/12/2017 06:07 AM    CALCIUM,IONIZED 3.80 (L) 09/08/2017 01:56 PM    Phosphorus 3.5 09/09/2017 08:00 AM        Clearence Cheek, MD  [facility]  Pager: [phone]

## 2017-09-12 NOTE — Progress Notes (Signed)
INTERNAL MEDICINE                                                                   Daily Progress Note  Patient:  [name] [name]  Today date: September 12, 2017  Date of Admission:  09/08/2017    Interval History and Events of the last 24 hours:    Reports improved SOB. No Nausea and vomiting, afebrile  ??      Assessment/Plan:       Patient seen in follow up for multiple medical problems as listed below :  1.??Sepsis due to UTI-POA:??  -WBC normalizing.    -UCx reports E.coli   -Continue  rocephin.  -Blood Cxs with ngtd @24  hours. Sputums Cx with only normal flora so far  -Procal of 0.38   -off Levophed.  ??  2. ??Metabolic encephalopathy: Resolved  -Favor Sepsis over medication effect at this point  -Orients x3.  -Did not respond to narcan in ED.  -NH3 and TSH normal.  -No acute findings reported on head CT. ??  -UDS is positive only for marijuana. ??  -Serum alcohol level negative, negative acetaminophen level, salicylate level is not elevated. ??  ??  3.????Intubation for airway protection on intake, Extubated on 3/30:   Wean O2 as tolerated    4. Pneumonia  CXR: Developing right lower lobe infiltrate.   Rocephin and Azithromycin  ??  4. Hyperkalemia: Resolved  -Dialyzed on 3/29-PM after K+ did not improve with medical mgmt.  -Monitor with renal failure  -nephrology following-has ordered 0.45% NS with 39mq of K+  ??  5. 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  -DDX-NSAID use, N/V, Medications-home Cozaar, Aldactone.  -Urine output  increasing  -Strict I/O.??  -IVF per nephro  -Udall Cath removed   ??  ??  6. UTI due to E.coli:??  -Possible source for #1,   -UCx as above, Abx as above  ??  7. ??COPD:   -Frequent nebs  -continue singulair.  -Pulm has changed Breo to pulmicort.  -Monitor s/p Extubation.  ??  8.??GERD:??   -Protonix for GI prophylaxis.  ??  9. Chronic R hip pain:   -Hold nabumetone, gabapentin. ??  -hold Seroquel and Trazodone until sure mentation stablizies.  -Fall reported on AM of 3/31-assisted down per nursing  -PT  ??  10. Hep C: Chronic  ??  .  11. HTN-chornic, but recently off pressors  -holding home meds including Aldactone, Losartan, Norvasc, Clonidine 0.3 BID.  -if needed, consider norvasc first.  ??  13. DM2:  -Glucommander to PO regimen  -A1c of 9.5 in 07/2017  ??  14. HLD  -continue Crestor  ??  PPX: Heparin TID  ??  Dispo: Continue to monitor renal function and urine output.  F/up recs from nephro. F/up Blood Cxs. Therapy consults. Pulm has written orders for Medical bed.  ??        DVT Prophylaxis:    Code Status:  .Full Code     Disposition and Family:   Recommend to continue hospitalization. Discussed with patient and nursing staff.    Anticipated  Date of Discharge: TBD  Anticipated Disposition (home, SNF) : HH    Labwork and Ancillary Studies     All labs/tests/imaging reviewed.Spoke with the nurse regarding patient issues.    Labwork:    CBC w/Diff   Lab Results   Component Value Date/Time    WBC 11.2 (H) 09/11/2017 05:40 AM    HGB 11.1 (L) 09/11/2017 05:40 AM    HCT 34.3 (L) 09/11/2017 05:40 AM    PLATELET 204 09/11/2017 05:40 AM    MCV 89.1 09/11/2017 05:40 AM        Basic Metabolic Profile   Lab Results   Component Value Date/Time    Sodium 139 09/12/2017 06:07 AM    Potassium 3.5 09/12/2017 06:07 AM    Chloride 109 (H) 09/12/2017 06:07 AM    CO2 22 09/12/2017 06:07 AM    Anion gap 8 09/12/2017 06:07 AM    Glucose 124 (H) 09/12/2017 06:07 AM    BUN 12 09/12/2017 06:07 AM    Creatinine 1.2 09/12/2017 06:07 AM    BUN/Creatinine ratio 24 (H) 06/10/2016 01:00 PM    GFR est AA 58.0 09/12/2017 06:07 AM    GFR est non-AA 48 09/12/2017 06:07 AM    Calcium 8.1 (L) 09/12/2017 06:07 AM        Cardiac Enzymes   Lab Results   Component Value Date/Time    CK 165 09/08/2017 09:30 AM     CK-MB Index 0.4 09/08/2017 09:30 AM    Troponin-I <0.015 09/08/2017 09:30 AM      Arterial Blood Gases   No results found for: PH, PHI, PCO2, PCO2I, PO2, PO2I, HCO3, HCO3I, FIO2, FIO2I   Coagulation    No results found for: APTT, PTP, INR   Hepatic Function   Lab Results   Component Value Date/Time    AST (SGOT) 11 (L) 09/09/2017 08:00 AM    Alk. phosphatase 53 09/09/2017 08:00 AM    Bilirubin, direct 0.2 09/09/2017 12:05 AM            XR Results (most recent):  Results from Hospital Encounter encounter on 09/08/17   XR CHEST SNGL V    Narrative INDICATION:  Post intubation       EXAMINATION:  XR CHEST SNGL V    COMPARISON:  09/08/2017    FINDINGS:  The study shows a normal sized heart.. Increased opacity at the right lower lung  field.. Known left-sided lung nodule. Also known multiple bullae. Endotracheal  tube in good position.      Impression IMPRESSION:  1. Developing right lower lobe infiltrate. Endotracheal tube in good position.         MRI Results (most recent):  Results from [facility] encounter on 04/07/15   MRI BRAIN W WO CONT    Narrative CLINICAL HISTORY:  Abnormal CT, meningioma    PROCEDURE: MRI brain without and with contrast    COMPARISON: Head CT dated 04/08/2015    FINDINGS:    There is age-related cerebral atrophy. There is extensive chronic small vessel  ischemic change, advanced for patient age. As noted on CT, there is a enhancing  extra-axial mass in the left CP angle along the petrous temporal bone measuring  10 mm. This is observed on image 9 series 12, image 6 series 11 and image 21  series 13. There are dural tails extending to the tentorium cerebelli. Imaging  findings would favor meningioma. No other areas of abnormal brain parenchymal  enhancement. The sella, pineal region, craniocervical  junction are unremarkable.  Skull base flow voids are patent.      Impression IMPRESSION:  1. Enhancing mass in the left posterior fossa adjacent to the petrous temporal   bone as described above. Main consideration would include meningioma. Other less  likely differential considerations would include dural metastasis and lymphoma.  Consider surveillance or further evaluation.    2.  Chronic small vessel ischemic changes, advanced for patient age.         CT Results (most recent):  Results from Hospital Encounter encounter on 09/08/17   CT HEAD WO CONT    Narrative INDICATION:  Altered mental status.        COMPARISON:  None    TECHNIQUE:  The CT scan of the head was performed without contrast. Coronal and sagittal  reconstructions were obtained.  DICOM format image data is available to non-affiliated external healthcare  facilities or entities on a secure, media free, reciprocally searchable basis  with patient authorization for 12 months following the date of the study.    FINDINGS:  The ventricular system shows normal dimensions and morphology. The white and  gray matter is well differentiated. Periventricular confluent white matter  hypoattenuation. Rounded 11.6 mm hypodensity in the left posterior fossa  adjacent to the left petrous bone and the tentorium suggesting meningioma. This  is unchanged. There is no intracranial bleed or evidence of an acute infarct.   No identification of a fracture. Mild ethmoid sinusitis.             Impression IMPRESSION:   1. Chronic microvascular white matter ischemic disease.  2. Small unchanged rounded hyperdense lesion in the left posterior fossa  adjacent to the petrous bone and tentorium likely meningioma.  3. Mild ethmoid sinusitis.                Current Inpatient Meds and Allergies     Medications:  Current Facility-Administered Medications   Medication Dose Route Frequency   ??? amLODIPine (NORVASC) tablet 10 mg  10 mg Oral DAILY   ??? azithromycin (ZITHROMAX) tablet 500 mg  500 mg Oral DAILY   ??? pantoprazole (PROTONIX) tablet 40 mg  40 mg Oral ACB   ??? 0.45% sodium chloride with KCl 20 mEq/L infusion   IntraVENous CONTINUOUS    ??? cefTRIAXone (ROCEPHIN) 1 g in 0.9% sodium chloride (MBP/ADV) 50 mL MBP  1 g IntraVENous Q24H   ??? morphine injection 2 mg  2 mg IntraVENous Q4H PRN   ??? dextrose (D50) infusion 5-25 g  10-50 mL IntraVENous PRN   ??? glucagon (GLUCAGEN) injection 1 mg  1 mg IntraMUSCular PRN   ??? insulin lispro (HUMALOG) injection 1-100 Units  1-100 Units SubCUTAneous AC&HS   ??? insulin lispro (HUMALOG) injection 1-100 Units  1-100 Units SubCUTAneous PRN   ??? budesonide (PULMICORT) 500 mcg/2 ml nebulizer suspension  500 mcg Nebulization BID RT   ??? dextrose (D50W) injection syrg 25 g  25 g IntraVENous PRN   ??? montelukast (SINGULAIR) tablet 10 mg  10 mg Oral QHS   ??? rosuvastatin (CRESTOR) tablet 5 mg  5 mg Oral QHS   ??? tiotropium bromide (SPIRIVA RESPIMAT) 2.5 mcg /actuation  1 Puff Inhalation DAILY   ??? white petrolatum-mineral oil (GENTEAL PM) 85-15 % ophthalmic ointment 1 Each  1 Each Both Eyes DAILY   ??? fentaNYL citrate (PF) injection 100-200 mcg  100-200 mcg IntraVENous Q1H PRN   ??? naloxone (NARCAN) injection 0.1 mg  0.1 mg IntraVENous PRN   ???  acetaminophen (TYLENOL) tablet 650 mg  650 mg Oral Q4H PRN    Or   ??? acetaminophen (TYLENOL) solution 650 mg  650 mg Oral Q4H PRN    Or   ??? acetaminophen (TYLENOL) suppository 650 mg  650 mg Rectal Q4H PRN   ??? ondansetron (ZOFRAN) injection 4 mg  4 mg IntraVENous Q4H PRN   ??? albuterol-ipratropium (DUO-NEB) 2.5 MG-0.5 MG/3 ML  3 mL Nebulization Q6H RT   ??? heparin (porcine) injection 5,000 Units  5,000 Units SubCUTAneous Q8H   ??? insulin glargine (LANTUS) injection 1-100 Units  1-100 Units SubCUTAneous QHS   ??? albuterol-ipratropium (DUO-NEB) 2.5 MG-0.5 MG/3 ML  3 mL Nebulization Q4H PRN   ??? heparin (porcine) pf 50 Units  50 Units InterCATHeter DAILY   ??? heparin (porcine) pf 50 Units  50 Units InterCATHeter PRN   ??? sodium chloride (NS) flush 10 mL  10 mL InterCATHeter DAILY   ??? sodium chloride (NS) flush 10 mL  10 mL InterCATHeter PRN          Objective:       Visit Vitals   BP (!) 154/102 (BP 1 Location: Right arm, BP Patient Position: Supine)   Pulse (!) 103   Temp 99.2 ??F (37.3 ??C)   Resp 18   Ht 5' 8"  (1.727 m)   Wt 101.5 kg (223 lb 12.3 oz)   LMP  (Exact Date)   SpO2 99%   Breastfeeding? No   BMI 34.02 kg/m??     Body mass index is 34.02 kg/m??.      Intake/Output Summary (Last 24 hours) at 09/12/2017 1149  Last data filed at 09/12/2017 1105  Gross per 24 hour   Intake 3307.9 ml   Output 3400 ml   Net -92.1 ml       General: ?? Alert, cooperative, no distress, appears stated age.??   Lungs:?? ?? Clear, no  wheeze, rhonchi, rales   Chest wall: ?? No tenderness or deformity.??   Heart: ?? Regular rate and rhythm, S1, S2 normal, no murmur, click, rub or gallop.??   Abdomen:?? ?? Soft, non-tender. Bowel sounds normal. No masses,?? No organomegaly.??       Musculoskeleta : Normal range of motion in most of the joints   Extremities:?? Extremities normal, atraumatic, no cyanosis or edema.??   Pulses:?? 2+ and symmetric all extremities.??   Skin:?? Skin color, texture, turgor normal. No rashes or lesions??   Neurologic  Psych:?? : CNII-XII intact.    Normal affect and mood . No thoughts of harm to self or others??             Total time spent with chart review, patient examination/education, discussion with staff on case,documentation and medication management / adjustment:   >35 minutes    It is always a pleasure to be involved in the clinical care of this patient.    Otila Kluver, MD  September 12, 2017  [facility]  Pager:  [phone]

## 2017-09-12 NOTE — Other (Signed)
Bedside shift change report given to Maria Nena D Snak Rn   (oncoming nurse) by Carmelo Rn/Alissa  (offgoing nurse). Report included the following information SBAR, Kardex, Intake/Output, MAR and Recent Results.

## 2017-09-12 NOTE — Progress Notes (Signed)
NUTRITION glycemic control evaluation / LOS    Education history: Has not recently been seen by RD for nutrition education, last RD note 07/24/16 - glycemic control eval     Current diet order: DIET RENAL Yes; Soft Solids; 4 Portions, Med    Diet history: 2 meals per day, cooks/dines out, usually eats lunch and dinner meals. Mostly regular diet.    UBW 190#, pt reports this is usually stable. Per bedscale on 3/30 - wt taken was 101.5 kg or 223# (? Discrepancy); pt denies hx of recent unintentional weight loss    Compliance history:    []  Compliant     [x]  Inconsistent/limited compliance     []  Noncompliant  []  Unknown     Biochemical data:  ?? BS at hospital: 4/1 - 4/2: Glu 123-193 (Glucommander/insulin)    HgbA1c:   Lab Results   Component Value Date/Time    Hemoglobin A1c 9.5 (H) 08/07/2017 03:25 AM    Hemoglobin A1c 7.2 (H) 08/15/2016 10:00 PM    Hemoglobin A1c 6.5 (H) 07/23/2016 02:57 AM    Hemoglobin A1c, External 6.6 01/08/2014   ??   ?? Average BS at home: Does not check BG at home per patient    Current meds affecting BS:   []  Insulin (drip)  [x]  Insulin (subq)  []  Insulin (pump)  []  Oral meds  []  Steroids  []  IV dextrose fluids  []  Other:    Home meds affecting BS:   []  Insulin (long acting)  []  Insulin (short/intermediate acting)  []  Insulin (pump)  [x]  Oral meds - compliant per patient   []  Steroids  []  Other:  Education needed:  [x]  yes - pt may benefit from continued reinforcement of compliance to DM diet guidelines, but currently refusing diet education   []  no______________________________________________________________________    Monia Sabaluth Kennedy, RD  09/12/17

## 2017-09-12 NOTE — Other (Cosign Needed)
Bedside and Verbal shift change report given to Alissa Moore ADNS (oncoming nurse) by Maria RN (offgoing nurse). Report included the following information SBAR and Kardex.

## 2017-09-12 NOTE — Progress Notes (Signed)
PHYSICAL THERAPY TREATMENT       Patient: [name] [name] (67 y.o. female)  Room: 5101/5101    Date: 09/12/2017  Start Time:  3:20 pm  End Time:  3:45 pm    Primary Diagnosis: Acute renal failure (ARF) (HCC) [N17.9]  Hyperkalemia [E87.5]  Obtunded [R40.1]  Septic shock (HCC) [A41.9, R65.21]  Metabolic encephalopathy [G93.41]         Precautions: Falls.  Weight bearing precautions: FWB    Schmid Fall Risk: Total Score: 6 (09/12/17 0904)      CBC w/Diff   Lab Results   Component Value Date/Time    WBC 11.2 (H) 09/11/2017 05:40 AM    RBC 3.85 09/11/2017 05:40 AM    HGB 11.1 (L) 09/11/2017 05:40 AM    HCT 34.3 (L) 09/11/2017 05:40 AM        Orders reviewed, chart reviewed, and initial evaluation completed on [name] [name].  Discussed with nursing.    ASSESSMENT :  Based on the objective data described below, the patient presents with   Decreased Strength  Decreased ADL/Functional Activities  Decreased Transfer Abilities  Decreased Ambulation Ability/Technique  Decreased Balance  Increased Pain  Decreased Activity Tolerance  Decreased Pacing Skills  Increased Fatigue  Increased Shortness of Breath.       All affecting patient's safety and independence with mobility    Patient will benefit from skilled  Physical Therapy intervention to address the above impairments.    Patient???s rehabilitation potential is considered to be Fair       PLAN :  Planned Interventions  Functional mobility training Nutritional therapist Therapeutic exercises Therapeutic activities Neuro muscular re-education AD training Patient/caregiver education  decrease muscle atrophy/weakness to allow increased function  Frequency/Duration:  Patient will benefit from skilled Physical Therapy intervention to address the above impairments to return to prior level of function. Patient will be seen 1-5 times/week for 2 weeks.     Recommendations:  Recommend continued physical therapy during acute stay. Recommend out of  bed activity to counteract ill effects of bedrest, with assistance from staff as needed.      Discharge Recommendations: Home with home health PT  Further Equipment Recommendations for Discharge: TBD          SUBJECTIVE:   "I am still sick."    OBJECTIVE DATA SUMMARY:   Present illness history:   Problem List  Date Reviewed: 09/19/2017          Codes Class Noted    Hyperkalemia ICD-10-CM: E87.5  ICD-9-CM: 276.7  2017/09/19        Obtunded ICD-10-CM: R40.1  ICD-9-CM: 780.09  2017-09-19        Acute renal failure (ARF) (HCC) ICD-10-CM: N17.9  ICD-9-CM: 584.9  Sep 19, 2017        Septic shock (HCC) ICD-10-CM: A41.9, R65.21  ICD-9-CM: 038.9, 785.52, 995.92  09-19-17        Metabolic encephalopathy ICD-10-CM: G93.41  ICD-9-CM: 348.31  September 19, 2017        Dehydration ICD-10-CM: E86.0  ICD-9-CM: 276.51  08/07/2017        COPD with acute exacerbation (HCC) ICD-10-CM: J44.1  ICD-9-CM: 491.21  08/07/2017        Acute-on-chronic kidney injury (HCC) ICD-10-CM: N17.9, N18.9  ICD-9-CM: 584.9, 585.9  08/07/2017        Chest pain ICD-10-CM: R07.9  ICD-9-CM: 786.50  06/15/2017        Dyspnea ICD-10-CM: R06.00  ICD-9-CM: 786.09  06/15/2017  Type 2 diabetes mellitus with diabetic neuropathy (HCC) ICD-10-CM: E11.40  ICD-9-CM: 250.60, 357.2  12/07/2016        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  ICD-9-CM: 569.89  08/17/2016        Cannabinoid hyperemesis syndrome (HCC) ICD-10-CM: Z61.096F12.988  ICD-9-CM: 536.2, 305.20  08/17/2016        Gastritis ICD-10-CM: K29.70  ICD-9-CM: 535.50  08/16/2016        Nausea & vomiting ICD-10-CM: R11.2  ICD-9-CM: 787.01  08/16/2016        Asthma with acute exacerbation ICD-10-CM: J45.901  ICD-9-CM: 493.92  08/04/2016        Lactic acidosis ICD-10-CM: E87.2  ICD-9-CM: 276.2  07/24/2016        Leukocytosis ICD-10-CM: D72.829  ICD-9-CM: 288.60  07/24/2016        Sepsis (HCC) ICD-10-CM: A41.9  ICD-9-CM: 038.9, 995.91  07/24/2016        Asthma exacerbation ICD-10-CM: J45.901  ICD-9-CM: 493.92  07/24/2016         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: E11.21  ICD-9-CM: 250.40, 583.81  06/12/2016        Sacroiliitis (HCC) ICD-10-CM: M46.1  ICD-9-CM: 720.2  11/25/2015        Essential hypertension ICD-10-CM: I10  ICD-9-CM: 401.9  10/06/2015        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  ICD-9-CM: 721.3  09/15/2015        Lumbar and sacral osteoarthritis ICD-10-CM: M47.817  ICD-9-CM: 721.3  09/15/2015        Chronic pain syndrome ICD-10-CM: G89.4  ICD-9-CM: 338.4  09/15/2015        Type 2 diabetes mellitus with hyperglycemia, without long-term current use of insulin (HCC) ICD-10-CM: E11.65  ICD-9-CM: 250.00, 790.29  08/20/2015        Acute colitis ICD-10-CM: K52.9  ICD-9-CM: 558.9  07/02/2015        Acute hyperglycemia ICD-10-CM: R73.9  ICD-9-CM: 790.29  07/02/2015        Accelerated hypertension ICD-10-CM: I10  ICD-9-CM: 401.0  04/08/2015        Severe headache ICD-10-CM: R51  ICD-9-CM: 784.0  04/07/2015        Osteoarthritis of hips, 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 Arthritis    ??? 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 COPD (chronic obstructive pulmonary disease) (HCC)    ??? DM2 (diabetes mellitus, type 2) (HCC)    ??? GERD (gastroesophageal reflux disease)    ??? Hepatitis C     HEP C   ??? HTN (hypertension)    ??? Lung nodule, multiple     (CT 10/2016) Small left upper lobe and 1.7 x 1.6 cm left lower lobe nodules not significantly changed from 07/23/2016 and 03/22/2016   ??? Marijuana use    ??? PTSD (post-traumatic stress disorder)     [place]   ??? Thoracic ascending aortic aneurysm (HCC)     4.4cm noted on CT Chest (10/2016)    ??? Thyroid nodule     2.5  cm stable right thyroid nodule (CT 10/2016)       PRIOR LEVEL OF FUNCTION / LIVING SITUATION     Information was obtained by:   patient  Home environment:   Bedroom 2nd floor  Prior level of function:   independent with ADLs and Independent with ambulation  Home equipment:   none reported    Lives with husband   Patient found: Bed and Bed alarm.    Pain Assessment before PT session: "I am just not feeling well today."  Patient stated she does not want to walk or sit out of bed. Also declined bed exercises.     COGNITIVE STATUS:     Mental Status:   Oriented x3    EXTREMITIES ASSESSMENT:      Tone & Sensation:   Normal    Sensation:  Intact  To light touch in bilateral LE's  ??  Strength:    bilateral, lower extremity(s) to be determined when patient willing to participate   Range Of Motion:bilateral, lower extremity(s)assess when patient willing to participate   Functional mobility and balance status:     Mobility: Patient unwilling to participate     Transfers:  CGA sit to stand to chair with walker.     Balance:  Fair     Activity Tolerance: Fair   Ambulation/Gait Training:  Ambulated 20 feet in room.    Stair Training:Refused     TREATMENT:   Patient ambulated in room with RW CGA 20 feet.  Patient initially  refused PT but then agreed to participate.     Transfer to chair CGA OOB. Husband in room nursing notified patient seated in chair.     Final Location:   bedside chair, chair alarm, all needs close and agrees to call for assistance    COMMUNICATION/EDUCATION:     Barriers to Learning/Limitations:  yes;  other refused treatment   Education provided patient on  Patient, Benefit of activity while hospitalized, Call for assistance, Out of bed 2-3 times/day, Staff assistance with mobility, Changes positions frequently, Sit out of bed for 45-60 minutes or as tolerated and Role of PT  Patient / Family readiness to learn indicated by:   other poor unwilling to particpate       Thank you for this referral.  Lyla Son A Shannelle Alguire, DPT

## 2017-09-13 ENCOUNTER — Inpatient Hospital Stay: Admit: 2017-09-13 | Payer: MEDICARE | Primary: Physician Assistant

## 2017-09-13 LAB — GLUCOSE, POC
Glucose (POC): 111 mg/dL — ABNORMAL HIGH (ref 65–105)
Glucose (POC): 114 mg/dL — ABNORMAL HIGH (ref 65–105)
Glucose (POC): 155 mg/dL — ABNORMAL HIGH (ref 65–105)
Glucose (POC): 99 mg/dL (ref 65–105)

## 2017-09-13 LAB — CBC WITH AUTOMATED DIFF
BASOPHILS: 0.3 % (ref 0–3)
EOSINOPHILS: 1.1 % (ref 0–5)
HCT: 32.1 % — ABNORMAL LOW (ref 37.0–50.0)
HGB: 10.4 gm/dl — ABNORMAL LOW (ref 13.0–17.2)
IMMATURE GRANULOCYTES: 0.4 % (ref 0.0–3.0)
LYMPHOCYTES: 21.7 % — ABNORMAL LOW (ref 28–48)
MCH: 29 pg (ref 25.4–34.6)
MCHC: 32.4 gm/dl (ref 30.0–36.0)
MCV: 89.4 fL (ref 80.0–98.0)
MONOCYTES: 8.9 % (ref 1–13)
MPV: 10.1 fL — ABNORMAL HIGH (ref 6.0–10.0)
NEUTROPHILS: 67.6 % — ABNORMAL HIGH (ref 34–64)
NRBC: 0 (ref 0–0)
PLATELET: 217 10*3/uL (ref 140–450)
RBC: 3.59 M/uL — ABNORMAL LOW (ref 3.60–5.20)
RDW-SD: 43.8 (ref 36.4–46.3)
WBC: 12.7 10*3/uL — ABNORMAL HIGH (ref 4.0–11.0)

## 2017-09-13 LAB — METABOLIC PANEL, BASIC
Anion gap: 6 mmol/L (ref 5–15)
BUN: 12 mg/dl (ref 7–25)
CO2: 24 mEq/L (ref 21–32)
Calcium: 8.2 mg/dl — ABNORMAL LOW (ref 8.5–10.1)
Chloride: 110 mEq/L — ABNORMAL HIGH (ref 98–107)
Creatinine: 1 mg/dl (ref 0.6–1.3)
GFR est AA: >60
GFR est non-AA: 59
Glucose: 126 mg/dl — ABNORMAL HIGH (ref 74–106)
Potassium: 3.6 mEq/L (ref 3.5–5.1)
Sodium: 140 mEq/L (ref 136–145)

## 2017-09-13 MED ORDER — OXYCODONE-ACETAMINOPHEN 5 MG-325 MG TAB
5-325 mg | Freq: Four times a day (QID) | ORAL | Status: DC | PRN
Start: 2017-09-13 — End: 2017-09-14
  Administered 2017-09-13 – 2017-09-14 (×4): via ORAL

## 2017-09-13 MED FILL — AMLODIPINE 5 MG TAB: 5 mg | ORAL | Qty: 2

## 2017-09-13 MED FILL — HEPARIN (PORCINE) 5,000 UNIT/ML IJ SOLN: 5000 unit/mL | INTRAMUSCULAR | Qty: 1

## 2017-09-13 MED FILL — ROSUVASTATIN 10 MG TAB: 10 mg | ORAL | Qty: 1

## 2017-09-13 MED FILL — IPRATROPIUM-ALBUTEROL 2.5 MG-0.5 MG/3 ML NEB SOLUTION: 2.5 mg-0.5 mg/3 ml | RESPIRATORY_TRACT | Qty: 3

## 2017-09-13 MED FILL — HEPARIN, PORCINE (PF) 10 UNIT/ML IV SYRINGE: 10 unit/mL | INTRAVENOUS | Qty: 5

## 2017-09-13 MED FILL — 1/2 NS WITH POTASSIUM CHLORIDE 20 MEQ/L IV: 20 mEq/L | INTRAVENOUS | Qty: 1000

## 2017-09-13 MED FILL — BUDESONIDE 0.5 MG/2 ML NEB SUSPENSION: 0.5 mg/2 mL | RESPIRATORY_TRACT | Qty: 1

## 2017-09-13 MED FILL — CLONIDINE 0.1 MG TAB: 0.1 mg | ORAL | Qty: 1

## 2017-09-13 MED FILL — MONTELUKAST 10 MG TAB: 10 mg | ORAL | Qty: 1

## 2017-09-13 MED FILL — MORPHINE 2 MG/ML INJECTION: 2 mg/mL | INTRAMUSCULAR | Qty: 1

## 2017-09-13 MED FILL — AZITHROMYCIN 250 MG TAB: 250 mg | ORAL | Qty: 2

## 2017-09-13 MED FILL — PANTOPRAZOLE 40 MG TAB, DELAYED RELEASE: 40 mg | ORAL | Qty: 1

## 2017-09-13 MED FILL — CEFTRIAXONE 1 GRAM SOLUTION FOR INJECTION: 1 gram | INTRAMUSCULAR | Qty: 1

## 2017-09-13 MED FILL — OXYCODONE-ACETAMINOPHEN 5 MG-325 MG TAB: 5-325 mg | ORAL | Qty: 1

## 2017-09-13 MED FILL — LOSARTAN 50 MG TAB: 50 mg | ORAL | Qty: 2

## 2017-09-13 NOTE — Progress Notes (Signed)
OCCUPATIONAL THERAPY MISSED SESSION        Patient: [name] ShutterMonica R [name] ([age] y.o. female)  Room: 5101/5101    Date: 09/13/2017  Time:  1015  Primary Diagnosis: Acute renal failure (ARF) (HCC) [N17.9]  Hyperkalemia [E87.5]  Obtunded [R40.1]  Septic shock (HCC) [A41.9, R65.21]  Metabolic encephalopathy [G93.41]    OBJECTIVE:  Patient was not seen for skilled occupational therapy treatment, secondary to Pt. declined.    Comments: Encouraged pt to increase activity and OOB but pt consistently declined.     PLAN: Will follow up as schedule permits.        Chrisandra CarotaNancy Shoemaker Jorgensen, OT

## 2017-09-13 NOTE — Progress Notes (Signed)
INTERNAL MEDICINE                                                                   Daily Progress Note  Patient:  [name] [name]  Today date: September 13, 2017  Date of Admission:  09/08/2017    Interval History and Events of the last 24 hours:    Reports improved SOB. No Nausea and vomiting, afebrile  ??      Assessment/Plan:       Patient seen in follow up for multiple medical problems as listed below :  1.??Sepsis due to UTI-Pneumonia:??  -UCx reports E.coli   -Continue  rocephin.  -Blood Cxs with ngtd Sputums Cx with only normal flora so far  -Procal of 0.38   -off Levophed.  ??  2. ??Metabolic encephalopathy: Resolved  -Favor Sepsis over medication effect at this point  -Orients x3.  -Did not respond to narcan in ED.  -NH3 and TSH normal.  -No acute findings reported on head CT. ??  -UDS is positive only for marijuana. ??  -Serum alcohol level negative, negative acetaminophen level, salicylate level is not elevated. ??  ??  3.????Intubation for airway protection on intake, Extubated on 3/30:   Wean O2 as tolerated    4. Pneumonia  CXR: Developing right lower lobe infiltrate.   Rocephin and Azithromycin  ??  4. Hyperkalemia: Resolved  -Dialyzed on 3/29-PM after K+ did not improve with medical mgmt.  -Monitor with renal failure  -nephrology following-has ordered 0.45% NS with 53mq of K+  ??  5. 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  -DDX-NSAID use, N/V, Medications-home Cozaar, Aldactone.  -Urine output  increasing  -Strict I/O.??  -IVF per nephro  -Udall Cath removed   ??  ??  6. UTI due to E.coli:??  -Possible source for #1,   -UCx as above, Abx as above  ??  7. ??COPD:   -Frequent nebs  -continue singulair.  -Pulm has changed Breo to pulmicort.  -Monitor s/p Extubation.  ??  8.??GERD:??  -Protonix for GI prophylaxis.  ??   9. Chronic R hip pain:   -Hold nabumetone, gabapentin. ??  -hold Seroquel and Trazodone until sure mentation stablizies.  -Fall reported on AM of 3/31-assisted down per nursing  -PT  ??  10. Hep C: Chronic  ??  .  11. HTN-chornic, but recently off pressors  -holding home meds including Aldactone, Losartan, Norvasc, Clonidine 0.3 BID.  -if needed, consider norvasc first.  ??  13. DM2:  -Glucommander to PO regimen  -A1c of 9.5 in 07/2017  ??  14. HLD  -continue Crestor  ??  PPX: Heparin TID  ??  Dispo: Continue to monitor renal function and urine output.  F/up recs from nephro. F/up Blood Cxs. Therapy consults. Pulm has written orders for Medical bed.  ??        DVT Prophylaxis:    Code Status:  .Full Code     Disposition and Family:   Recommend to continue hospitalization. Discussed with patient and nursing staff.    Anticipated Date of Discharge: TBD  Anticipated Disposition (  home, SNF) : HH    Labwork and Ancillary Studies     All labs/tests/imaging reviewed.Spoke with the nurse regarding patient issues.    Labwork:    CBC w/Diff   Lab Results   Component Value Date/Time    WBC 12.7 (H) 09/13/2017 07:00 AM    HGB 10.4 (L) 09/13/2017 07:00 AM    HCT 32.1 (L) 09/13/2017 07:00 AM    PLATELET 217 09/13/2017 07:00 AM    MCV 89.4 09/13/2017 07:00 AM        Basic Metabolic Profile   Lab Results   Component Value Date/Time    Sodium 140 09/13/2017 07:00 AM    Potassium 3.6 09/13/2017 07:00 AM    Chloride 110 (H) 09/13/2017 07:00 AM    CO2 24 09/13/2017 07:00 AM    Anion gap 6 09/13/2017 07:00 AM    Glucose 126 (H) 09/13/2017 07:00 AM    BUN 12 09/13/2017 07:00 AM    Creatinine 1.0 09/13/2017 07:00 AM    BUN/Creatinine ratio 24 (H) 06/10/2016 01:00 PM    GFR est AA >60.0 09/13/2017 07:00 AM    GFR est non-AA 59 09/13/2017 07:00 AM    Calcium 8.2 (L) 09/13/2017 07:00 AM        Cardiac Enzymes   Lab Results   Component Value Date/Time    CK 165 09/08/2017 09:30 AM    CK-MB Index 0.4 09/08/2017 09:30 AM     Troponin-I <0.015 09/08/2017 09:30 AM      Arterial Blood Gases   No results found for: PH, PHI, PCO2, PCO2I, PO2, PO2I, HCO3, HCO3I, FIO2, FIO2I   Coagulation    No results found for: APTT, PTP, INR   Hepatic Function   Lab Results   Component Value Date/Time    AST (SGOT) 11 (L) 09/09/2017 08:00 AM    Alk. phosphatase 53 09/09/2017 08:00 AM    Bilirubin, direct 0.2 09/09/2017 12:05 AM            XR Results (most recent):  Results from Hospital Encounter encounter on 09/08/17   XR CHEST SNGL V    Narrative INDICATION:  Post intubation       EXAMINATION:  XR CHEST SNGL V    COMPARISON:  09/08/2017    FINDINGS:  The study shows a normal sized heart.. Increased opacity at the right lower lung  field.. Known left-sided lung nodule. Also known multiple bullae. Endotracheal  tube in good position.      Impression IMPRESSION:  1. Developing right lower lobe infiltrate. Endotracheal tube in good position.         MRI Results (most recent):  Results from [place] encounter on 04/07/15   MRI BRAIN W WO CONT    Narrative CLINICAL HISTORY:  Abnormal CT, meningioma    PROCEDURE: MRI brain without and with contrast    COMPARISON: Head CT dated 04/08/2015    FINDINGS:    There is age-related cerebral atrophy. There is extensive chronic small vessel  ischemic change, advanced for patient age. As noted on CT, there is a enhancing  extra-axial mass in the left CP angle along the petrous temporal bone measuring  10 mm. This is observed on image 9 series 12, image 6 series 11 and image 21  series 13. There are dural tails extending to the tentorium cerebelli. Imaging  findings would favor meningioma. No other areas of abnormal brain parenchymal  enhancement. The sella, pineal region, craniocervical junction are unremarkable.  Skull base flow  voids are patent.      Impression IMPRESSION:  1. Enhancing mass in the left posterior fossa adjacent to the petrous temporal   bone as described above. Main consideration would include meningioma. Other less  likely differential considerations would include dural metastasis and lymphoma.  Consider surveillance or further evaluation.    2.  Chronic small vessel ischemic changes, advanced for patient age.         CT Results (most recent):  Results from Hospital Encounter encounter on 09/08/17   CT HEAD WO CONT    Narrative INDICATION:  Altered mental status.        COMPARISON:  None    TECHNIQUE:  The CT scan of the head was performed without contrast. Coronal and sagittal  reconstructions were obtained.  DICOM format image data is available to non-affiliated external healthcare  facilities or entities on a secure, media free, reciprocally searchable basis  with patient authorization for 12 months following the date of the study.    FINDINGS:  The ventricular system shows normal dimensions and morphology. The white and  gray matter is well differentiated. Periventricular confluent white matter  hypoattenuation. Rounded 11.6 mm hypodensity in the left posterior fossa  adjacent to the left petrous bone and the tentorium suggesting meningioma. This  is unchanged. There is no intracranial bleed or evidence of an acute infarct.   No identification of a fracture. Mild ethmoid sinusitis.             Impression IMPRESSION:   1. Chronic microvascular white matter ischemic disease.  2. Small unchanged rounded hyperdense lesion in the left posterior fossa  adjacent to the petrous bone and tentorium likely meningioma.  3. Mild ethmoid sinusitis.                Current Inpatient Meds and Allergies     Medications:  Current Facility-Administered Medications   Medication Dose Route Frequency   ??? amLODIPine (NORVASC) tablet 10 mg  10 mg Oral DAILY   ??? azithromycin (ZITHROMAX) tablet 500 mg  500 mg Oral DAILY   ??? losartan (COZAAR) tablet 100 mg  100 mg Oral DAILY   ??? cloNIDine HCl (CATAPRES) tablet 0.3 mg  0.3 mg Oral BID    ??? pantoprazole (PROTONIX) tablet 40 mg  40 mg Oral ACB   ??? 0.45% sodium chloride with KCl 20 mEq/L infusion   IntraVENous CONTINUOUS   ??? cefTRIAXone (ROCEPHIN) 1 g in 0.9% sodium chloride (MBP/ADV) 50 mL MBP  1 g IntraVENous Q24H   ??? morphine injection 2 mg  2 mg IntraVENous Q4H PRN   ??? dextrose (D50) infusion 5-25 g  10-50 mL IntraVENous PRN   ??? glucagon (GLUCAGEN) injection 1 mg  1 mg IntraMUSCular PRN   ??? insulin lispro (HUMALOG) injection 1-100 Units  1-100 Units SubCUTAneous AC&HS   ??? insulin lispro (HUMALOG) injection 1-100 Units  1-100 Units SubCUTAneous PRN   ??? budesonide (PULMICORT) 500 mcg/2 ml nebulizer suspension  500 mcg Nebulization BID RT   ??? dextrose (D50W) injection syrg 25 g  25 g IntraVENous PRN   ??? montelukast (SINGULAIR) tablet 10 mg  10 mg Oral QHS   ??? rosuvastatin (CRESTOR) tablet 5 mg  5 mg Oral QHS   ??? tiotropium bromide (SPIRIVA RESPIMAT) 2.5 mcg /actuation  1 Puff Inhalation DAILY   ??? white petrolatum-mineral oil (GENTEAL PM) 85-15 % ophthalmic ointment 1 Each  1 Each Both Eyes DAILY   ??? fentaNYL citrate (PF) injection 100-200 mcg  100-200  mcg IntraVENous Q1H PRN   ??? naloxone (NARCAN) injection 0.1 mg  0.1 mg IntraVENous PRN   ??? acetaminophen (TYLENOL) tablet 650 mg  650 mg Oral Q4H PRN    Or   ??? acetaminophen (TYLENOL) solution 650 mg  650 mg Oral Q4H PRN    Or   ??? acetaminophen (TYLENOL) suppository 650 mg  650 mg Rectal Q4H PRN   ??? ondansetron (ZOFRAN) injection 4 mg  4 mg IntraVENous Q4H PRN   ??? albuterol-ipratropium (DUO-NEB) 2.5 MG-0.5 MG/3 ML  3 mL Nebulization Q6H RT   ??? heparin (porcine) injection 5,000 Units  5,000 Units SubCUTAneous Q8H   ??? insulin glargine (LANTUS) injection 1-100 Units  1-100 Units SubCUTAneous QHS   ??? albuterol-ipratropium (DUO-NEB) 2.5 MG-0.5 MG/3 ML  3 mL Nebulization Q4H PRN   ??? heparin (porcine) pf 50 Units  50 Units InterCATHeter DAILY   ??? heparin (porcine) pf 50 Units  50 Units InterCATHeter PRN    ??? sodium chloride (NS) flush 10 mL  10 mL InterCATHeter DAILY   ??? sodium chloride (NS) flush 10 mL  10 mL InterCATHeter PRN          Objective:       Visit Vitals  BP (!) 138/91 (BP 1 Location: Right arm, BP Patient Position: Supine)   Pulse (!) 106   Temp 99.4 ??F (37.4 ??C)   Resp 16   Ht 5' 8"  (1.727 m)   Wt 101.5 kg (223 lb 12.3 oz)   LMP  (Exact Date)   SpO2 98%   Breastfeeding? No   BMI 34.02 kg/m??     Body mass index is 34.02 kg/m??.      Intake/Output Summary (Last 24 hours) at 09/13/2017 1322  Last data filed at 09/13/2017 0535  Gross per 24 hour   Intake 1410 ml   Output 1330 ml   Net 80 ml       General: ?? Alert, cooperative, no distress, appears stated age.??   Lungs:?? ?? Clear, no  wheeze, rhonchi, rales   Chest wall: ?? No tenderness or deformity.??   Heart: ?? Regular rate and rhythm, S1, S2 normal, no murmur, click, rub or gallop.??   Abdomen:?? ?? Soft, non-tender. Bowel sounds normal. No masses,?? No organomegaly.??       Musculoskeleta : Normal range of motion in most of the joints   Extremities:?? Extremities normal, atraumatic, no cyanosis or edema.??   Pulses:?? 2+ and symmetric all extremities.??   Skin:?? Skin color, texture, turgor normal. No rashes or lesions??   Neurologic  Psych:?? : CNII-XII intact.    Normal affect and mood . No thoughts of harm to self or others??             Total time spent with chart review, patient examination/education, discussion with staff on case,documentation and medication management / adjustment:   >35 minutes    It is always a pleasure to be involved in the clinical care of this patient.    Otila Kluver, MD  September 13, 2017  [facility]  Pager:  [phone]

## 2017-09-13 NOTE — Other (Signed)
Bedside shift change report given to Arnel RN (oncoming nurse) by Maria Nena RN (offgoing nurse). Report included the following information SBAR and Kardex.

## 2017-09-13 NOTE — Other (Signed)
Bedside shift change report given to Maria Nena D Snak Rn   (oncoming nurse) by Carmelo Rn (offgoing nurse). Report included the following information SBAR, Kardex, Intake/Output, MAR and Recent Results.

## 2017-09-14 LAB — CBC WITH AUTOMATED DIFF
BASOPHILS: 0.3 % (ref 0–3)
EOSINOPHILS: 1.3 % (ref 0–5)
HCT: 29.5 % — ABNORMAL LOW (ref 37.0–50.0)
HGB: 9.9 gm/dl — ABNORMAL LOW (ref 13.0–17.2)
IMMATURE GRANULOCYTES: 0.5 % (ref 0.0–3.0)
LYMPHOCYTES: 24 % — ABNORMAL LOW (ref 28–48)
MCH: 29.3 pg (ref 25.4–34.6)
MCHC: 33.6 gm/dl (ref 30.0–36.0)
MCV: 87.3 fL (ref 80.0–98.0)
MONOCYTES: 8.8 % (ref 1–13)
MPV: 10 fL (ref 6.0–10.0)
NEUTROPHILS: 65.1 % — ABNORMAL HIGH (ref 34–64)
NRBC: 0 (ref 0–0)
PLATELET: 192 10*3/uL (ref 140–450)
RBC: 3.38 M/uL — ABNORMAL LOW (ref 3.60–5.20)
RDW-SD: 42.5 (ref 36.4–46.3)
WBC: 11.6 10*3/uL — ABNORMAL HIGH (ref 4.0–11.0)

## 2017-09-14 LAB — METABOLIC PANEL, BASIC
Anion gap: 8 mmol/L (ref 5–15)
BUN: 10 mg/dl (ref 7–25)
CO2: 23 mEq/L (ref 21–32)
Calcium: 8.1 mg/dl — ABNORMAL LOW (ref 8.5–10.1)
Chloride: 108 mEq/L — ABNORMAL HIGH (ref 98–107)
Creatinine: 0.9 mg/dl (ref 0.6–1.3)
GFR est AA: >60
GFR est non-AA: >60
Glucose: 110 mg/dl — ABNORMAL HIGH (ref 74–106)
Potassium: 3.7 mEq/L (ref 3.5–5.1)
Sodium: 140 mEq/L (ref 136–145)

## 2017-09-14 LAB — GLUCOSE, POC
Glucose (POC): 119 mg/dL — ABNORMAL HIGH (ref 65–105)
Glucose (POC): 100 mg/dL (ref 65–105)
Glucose (POC): 122 mg/dL — ABNORMAL HIGH (ref 65–105)

## 2017-09-14 LAB — CULTURE, BLOOD
Blood Culture Result: NO GROWTH
Blood Culture Result: NO GROWTH

## 2017-09-14 MED ORDER — CEFDINIR 300 MG CAP
300 mg | ORAL_CAPSULE | Freq: Two times a day (BID) | ORAL | 0 refills | Status: DC
Start: 2017-09-14 — End: 2017-10-11

## 2017-09-14 MED ORDER — ALBUTEROL SULFATE HFA 90 MCG/ACTUATION AEROSOL INHALER
90 mcg/actuation | RESPIRATORY_TRACT | 0 refills | Status: DC
Start: 2017-09-14 — End: 2017-10-11

## 2017-09-14 MED ORDER — METFORMIN SR 750 MG 24 HR TABLET
750 mg | ORAL_TABLET | Freq: Two times a day (BID) | ORAL | 0 refills | Status: DC
Start: 2017-09-14 — End: 2018-01-01

## 2017-09-14 MED ORDER — AZITHROMYCIN 500 MG TAB
500 mg | ORAL_TABLET | Freq: Every day | ORAL | 0 refills | Status: DC
Start: 2017-09-14 — End: 2017-10-11

## 2017-09-14 MED FILL — IPRATROPIUM-ALBUTEROL 2.5 MG-0.5 MG/3 ML NEB SOLUTION: 2.5 mg-0.5 mg/3 ml | RESPIRATORY_TRACT | Qty: 3

## 2017-09-14 MED FILL — MORPHINE 2 MG/ML INJECTION: 2 mg/mL | INTRAMUSCULAR | Qty: 1

## 2017-09-14 MED FILL — AMLODIPINE 5 MG TAB: 5 mg | ORAL | Qty: 2

## 2017-09-14 MED FILL — INSULIN LISPRO 100 UNIT/ML INJECTION: 100 unit/mL | SUBCUTANEOUS | Qty: 1

## 2017-09-14 MED FILL — PANTOPRAZOLE 40 MG TAB, DELAYED RELEASE: 40 mg | ORAL | Qty: 1

## 2017-09-14 MED FILL — LOSARTAN 50 MG TAB: 50 mg | ORAL | Qty: 2

## 2017-09-14 MED FILL — CLONIDINE 0.1 MG TAB: 0.1 mg | ORAL | Qty: 1

## 2017-09-14 MED FILL — HEPARIN (PORCINE) 5,000 UNIT/ML IJ SOLN: 5000 unit/mL | INTRAMUSCULAR | Qty: 1

## 2017-09-14 MED FILL — OXYCODONE-ACETAMINOPHEN 5 MG-325 MG TAB: 5-325 mg | ORAL | Qty: 1

## 2017-09-14 MED FILL — 1/2 NS WITH POTASSIUM CHLORIDE 20 MEQ/L IV: 20 mEq/L | INTRAVENOUS | Qty: 1000

## 2017-09-14 MED FILL — BD POSIFLUSH NORMAL SALINE 0.9 % INJECTION SYRINGE: INTRAMUSCULAR | Qty: 10

## 2017-09-14 MED FILL — HEPARIN, PORCINE (PF) 10 UNIT/ML IV SYRINGE: 10 unit/mL | INTRAVENOUS | Qty: 5

## 2017-09-14 MED FILL — AZITHROMYCIN 250 MG TAB: 250 mg | ORAL | Qty: 2

## 2017-09-14 MED FILL — BUDESONIDE 0.5 MG/2 ML NEB SUSPENSION: 0.5 mg/2 mL | RESPIRATORY_TRACT | Qty: 1

## 2017-09-14 NOTE — Progress Notes (Signed)
Discharge Date: 09/14/2017    Discharge Location: Home    Discharge Needs: Home Health Comfort Care    Transportation: Family    Communication with: patient, comfort care

## 2017-09-14 NOTE — Other (Signed)
Bedside shift change report given to Lina (oncoming nurse) by Nena (offgoing nurse). Report included the following information SBAR.

## 2017-09-14 NOTE — Discharge Summary (Signed)
Discharge Summary       Patient: [name] [name] Age: 67 y.o. DOB: 1951-03-07 MR#: [id number] SSN: [id number]  PCP on record: Wallene Huh, PA-C  Admit date: 09/08/2017  Discharge date: 09/14/2017    Admission Diagnoses:   Acute renal failure (ARF) ([place]) [N17.9]  Hyperkalemia [E87.5]  Obtunded [R40.1]  Septic shock ([place]) [A41.9, T70.01]  Metabolic encephalopathy [V49.44]  -    Discharge Diagnoses:       Sepsis??due to UTI-Pneumonia:??  Metabolic encephalopathy:??Resolved  Intubation for airway protection??  Pneumonia  UTI  Hyperkalemia:????  Acute renal failure   COPD:   GERD:??  Chronic R hip pain:   Hep C: Chronic  HTN-chornic.  DM2:  HLD          Medical History   [name] [name] is a 67 y.o. AA female with past medical history of chronic pain related to the right hip, COPD, diabetes, hypertension, hepatitis C, GERD, pulmonary nodules, PTSD, and marijuana use, who presents with altered mentation, acute kidney injury, and hyperkalemia.  The patient is able to tell me her name and briefly follow some commands but is plan for intubation due to airway protection. She is unable to provide further history.  Most of her history is provided by her spouse at bedside.  She reports she was in her usual state of health prior to yesterday but yesterday began to develop increased vomiting and nausea.  He described a producing mostly foamy white emesis.  This morning he found her having fallen out of bed.  He reports she has chronic pain related to the right hip for which she has had surgery in the past about 2 years ago.  He reports that she may have overused pain medication with reporting that she appears to be on Nabumetone. Her husband also reports some foul-smelling urine. In the emergency department she was found to have a potassium level of 8, sodium level 127, BUN and creatinine of 67 and 7.6, which was normal about 1 month ago.  Also leukocytosis.   She has been started on treatment  for sepsis as well as hyperkalemia and is requested for ICU admission.,           Hospital Course by Problem   Patient seen in follow up for multiple medical problems as listed below :  1.??Sepsis??due to UTI-Pneumonia:??  -UCx reports E.coli   -Continue  rocephin.  -Blood Cxs??with ngtd Sputums Cx with only normal flora so far  -Procal of 0.38   -Sepsis resolved  ??  2. ??Metabolic encephalopathy:??Resolved  -Favor Sepsis over medication effect at this point  -Orients x3.  -Did not respond to narcan in ED.  -NH3 and TSH normal.  -No acute findings reported on head CT. ??  -UDS is positive only for marijuana. ??  -Serum alcohol level negative, negative acetaminophen level, salicylate level is not elevated.   Resolved ??  ??  3.????Intubation for airway protection??on intake,??Extubated??on 3/30:??  Wean O2 as tolerated  ??  4. Pneumonia  CXR: Developing right lower lobe infiltrate.   Rocephin and Azithromycin  Change to Cefdinir and Azith  ??  4. Hyperkalemia:??Resolved  -Dialyzed on 3/29-PM after K+ did not improve with medical mgmt.  -Monitor with renal failure  -nephrology following-has ordered 0.45% NS with 59mq of K+  ??  5. ??Acute renal failure  -Baseline BUN and creatinine of 16 and 0.9 on February 27 of this year, presented at that time with mild AKI with a creatinine of 1.8.??  -  DDX-NSAID use, N/V, Medications-home Cozaar, Aldactone.  -Urine output ??increasing  -Strict I/O.??  -IVF per nephro  -[place]dall Cath removed   ??-Resolved   ??  6. UTI??due to E.coli:??  -Possible source for #1,   -UCx as above, Abx as above  ??  7. ??COPD:   -Frequent nebs  -continue singulair.  -Pulm has changed Breo to pulmicort.  -Monitor s/p Extubation.  ??  8.??GERD:??  -Protonix for GI prophylaxis.  ??  9. Chronic R hip pain:   -Hold nabumetone, gabapentin. ??  -hold Seroquel and Trazodone until sure mentation stablizies.  -Fall reported on AM of 3/31-assisted down per nursing  -PT  ??  10. Hep C: Chronic  ??    11. HTN-chornic, but recently off pressors   -Resume home meds including Aldactone, Losartan, Norvasc, Clonidine 0.3 BID.  ??  13. DM2:  -Resume home medication   -A1c of 9.5 in 07/2017  ??  14. HLD  -continue Crestor  ??  Stable to D/C home with PT/HH        Today's examination of the patient revealed:     Objective:   VS:   Visit Vitals  BP 131/88 (BP 1 Location: Right arm, BP Patient Position: Supine)   Pulse 96   Temp 98.6 ??F (37 ??C)   Resp 16   Ht 5' 8" (1.727 m)   Wt 101.5 kg (223 lb 12.3 oz)   LMP  (Exact Date)   SpO2 94%   Breastfeeding? No   BMI 34.02 kg/m??      Tmax/24hrs: Temp (24hrs), Avg:98.9 ??F (37.2 ??C), Min:98.3 ??F (36.8 ??C), Max:99.4 ??F (37.4 ??C)     Input/Output:     Intake/Output Summary (Last 24 hours) at 09/14/2017 1027  Last data filed at 09/14/2017 0624  Gross per 24 hour   Intake 1902.46 ml   Output ???   Net 1902.46 ml       General appearance: no distress  Eyes: sclera anicteric  ENT: no oral lesions, thyroid normal  Skin: no spider angiomata, jaundice,   Respiratory: clear to auscultation bilaterally  Cardiovascular: regular heart rate, no murmurs, no JVD  Abdomen: soft, non-tender, no rebound  Extremities: no muscle wasting, no gross arthritic changes  GU: not examined  Neurologic: alert and oriented, cranial nerves grossly intact,         Labs:    Recent Results (from the past 24 hour(s))   GLUCOSE, POC    Collection Time: 09/13/17 12:15 PM   Result Value Ref Range    Glucose (POC) 155 (H) 65 - 105 mg/dL   GLUCOSE, POC    Collection Time: 09/13/17  5:24 PM   Result Value Ref Range    Glucose (POC) 99 65 - 105 mg/dL   GLUCOSE, POC    Collection Time: 09/13/17  9:39 PM   Result Value Ref Range    Glucose (POC) 119 (H) 65 - 751 mg/dL   METABOLIC PANEL, BASIC    Collection Time: 09/14/17  6:50 AM   Result Value Ref Range    Sodium 140 136 - 145 mEq/L    Potassium 3.7 3.5 - 5.1 mEq/L    Chloride 108 (H) 98 - 107 mEq/L    CO2 23 21 - 32 mEq/L    Glucose 110 (H) 74 - 106 mg/dl    BUN 10 7 - 25 mg/dl    Creatinine 0.9 0.6 - 1.3 mg/dl     GFR est AA >60.0  GFR est non-AA >60      Calcium 8.1 (L) 8.5 - 10.1 mg/dl    Anion gap 8 5 - 15 mmol/L   CBC WITH AUTOMATED DIFF    Collection Time: 09/14/17  6:50 AM   Result Value Ref Range    WBC 11.6 (H) 4.0 - 11.0 1000/mm3    RBC 3.38 (L) 3.60 - 5.20 M/uL    HGB 9.9 (L) 13.0 - 17.2 gm/dl    HCT 29.5 (L) 37.0 - 50.0 %    MCV 87.3 80.0 - 98.0 fL    MCH 29.3 25.4 - 34.6 pg    MCHC 33.6 30.0 - 36.0 gm/dl    PLATELET 192 140 - 450 1000/mm3    MPV 10.0 6.0 - 10.0 fL    RDW-SD 42.5 36.4 - 46.3      NRBC 0 0 - 0      IMMATURE GRANULOCYTES 0.5 0.0 - 3.0 %    NEUTROPHILS 65.1 (H) 34 - 64 %    LYMPHOCYTES 24.0 (L) 28 - 48 %    MONOCYTES 8.8 1 - 13 %    EOSINOPHILS 1.3 0 - 5 %    BASOPHILS 0.3 0 - 3 %   GLUCOSE, POC    Collection Time: 09/14/17  8:15 AM   Result Value Ref Range    Glucose (POC) 100 65 - 105 mg/dL     Additional Data Reviewed:      XR Results (most recent):  Results from Hospital Encounter encounter on 09/08/17   XR CHEST PA LAT    Narrative CHEST X-RAY, (frontal and lateral view):     INDICATION: Pneumonia      COMPARISON: 09/08/2017, 08/06/2017      Impression IMPRESSION:   Left sided central venous catheter terminates at the cavoatrial junction.   Cardiomediastinal outline shows tortuous aorta with similar appearance to prior  exam 09/08/2017.  Left lung nodule similar to recent CT 08/06/2017.  Large bullae  present bilaterally.  ETT no longer seen.  Interval improvement in right lower  lung zone opacity.  Scarring seen in the right middle lobe.         MRI Results (most recent):  Results from [facility] encounter on 04/07/15   MRI BRAIN W WO CONT    Narrative CLINICAL HISTORY:  Abnormal CT, meningioma    PROCEDURE: MRI brain without and with contrast    COMPARISON: Head CT dated 04/08/2015    FINDINGS:    There is age-related cerebral atrophy. There is extensive chronic small vessel  ischemic change, advanced for patient age. As noted on CT, there is a enhancing   extra-axial mass in the left CP angle along the petrous temporal bone measuring  10 mm. This is observed on image 9 series 12, image 6 series 11 and image 21  series 13. There are dural tails extending to the tentorium cerebelli. Imaging  findings would favor meningioma. No other areas of abnormal brain parenchymal  enhancement. The sella, pineal region, craniocervical junction are unremarkable.  Skull base flow voids are patent.      Impression IMPRESSION:  1. Enhancing mass in the left posterior fossa adjacent to the petrous temporal  bone as described above. Main consideration would include meningioma. Other less  likely differential considerations would include dural metastasis and lymphoma.  Consider surveillance or further evaluation.    2.  Chronic small vessel ischemic changes, advanced for patient age.         CT Results (  most recent):  Results from [facility] encounter on 09/08/17   CT HEAD WO CONT    Narrative INDICATION:  Altered mental status.        COMPARISON:  None    TECHNIQUE:  The CT scan of the head was performed without contrast. Coronal and sagittal  reconstructions were obtained.  DICOM format image data is available to non-affiliated external healthcare  facilities or entities on a secure, media free, reciprocally searchable basis  with patient authorization for 12 months following the date of the study.    FINDINGS:  The ventricular system shows normal dimensions and morphology. The white and  gray matter is well differentiated. Periventricular confluent white matter  hypoattenuation. Rounded 11.6 mm hypodensity in the left posterior fossa  adjacent to the left petrous bone and the tentorium suggesting meningioma. This  is unchanged. There is no intracranial bleed or evidence of an acute infarct.   No identification of a fracture. Mild ethmoid sinusitis.             Impression IMPRESSION:   1. Chronic microvascular white matter ischemic disease.   2. Small unchanged rounded hyperdense lesion in the left posterior fossa  adjacent to the petrous bone and tentorium likely meningioma.  3. Mild ethmoid sinusitis.                   Condition:   Stable     Diet:   DM diet  Disposition:    []Home   [x]Home with Home Health   []SNF/NH   []Rehab   []Home with family   []Alternate Facility:____________________      Discharge Medications:        My Medications      START taking these medications      Instructions Each Dose to Equal Morning Noon Evening Bedtime   azithromycin 500 mg Tab  Commonly known as:  ZITHROMAX    Your last dose was:      Your next dose is:          Take 1 Tab by mouth daily.   500 mg                 cefdinir 300 mg capsule  Commonly known as:  OMNICEF    Your last dose was:      Your next dose is:          Take 1 Cap by mouth two (2) times a day.   300 mg                    CHANGE how you take these medications      Instructions Each Dose to Equal Morning Noon Evening Bedtime   * albuterol 1.25 mg/3 mL Nebu  Commonly known as:  ACCUNEB  What changed:  Another medication with the same name was changed. Make sure you understand how and when to take each.    Your last dose was:      Your next dose is:          3 mL by Nebulization route every six (6) hours as needed.   1.25 mg                 * albuterol 90 mcg/actuation inhaler  Commonly known as:  PROVENTIL HFA, VENTOLIN HFA, PROAIR HFA  Start taking on:  09/14/2017  What changed:  See the new instructions.    Your last dose was:  Your next dose is:          Take 2 Puffs by inhalation every six (6) hours for 5 days, THEN 2 Puffs every four (4) hours as needed for Wheezing or Shortness of Breath for up to 30 days.                  metFORMIN ER 750 mg tablet  Commonly known as:  GLUCOPHAGE XR  Start taking on:  09/18/2017  What changed:    ?? These instructions start on 09/18/2017. If you are unsure what to do until then, ask your doctor or other care provider.   ?? Another medication with the same name was removed. Continue taking this medication, and follow the directions you see here.    Your last dose was:      Your next dose is:          Take 1 Tab by mouth two (2) times a day.   750 mg                 * rosuvastatin 5 mg tablet  Commonly known as:  CRESTOR  What changed:  Another medication with the same name was added. Make sure you understand how and when to take each.    Your last dose was:      Your next dose is:          TAKE 1 TABLET BY MOUTH NIGHTLY                  * rosuvastatin 5 mg tablet  Commonly known as:  CRESTOR  What changed:  You were already taking a medication with the same name, and this prescription was added. Make sure you understand how and when to take each.    Your last dose was:      Your next dose is:          TAKE 1 TABLET BY MOUTH NIGHTLY                      * This list has 4 medication(s) that are the same as other medications prescribed for you. Read the directions carefully, and ask your doctor or other care provider to review them with you.            CONTINUE taking these medications      Instructions Each Dose to Equal Morning Noon Evening Bedtime   amLODIPine 10 mg tablet  Commonly known as:  NORVASC    Your last dose was:      Your next dose is:          TAKE ONE TABLET BY MOUTH ONCE DAILY                  benzonatate 100 mg capsule  Commonly known as:  TESSALON PERLES    Your last dose was:      Your next dose is:          Take 1 Cap by mouth three (3) times daily as needed for Cough.   100 mg                 Blood-Glucose Meter monitoring kit    Your last dose was:      Your next dose is:          Check sugars 2 times per day, fasting and 2 hours after dinner  cloNIDine HCl 0.3 mg tablet  Commonly known as:  CATAPRES    Your last dose was:      Your next dose is:          TAKE ONE TABLET BY MOUTH TWICE DAILY                  fluticasone propion-salmeterol 100-50 mcg/dose diskus inhaler   Commonly known as:  ADVAIR DISKUS    Your last dose was:      Your next dose is:          Take 1 Puff by inhalation two (2) times a day.   1 Puff                 gabapentin 100 mg capsule  Commonly known as:  NEURONTIN    Your last dose was:      Your next dose is:          Take 100 mg by mouth two (2) times a day.   100 mg                 glucose blood VI test strips strip  Commonly known as:  ASCENSIA AUTODISC VI, ONE TOUCH ULTRA TEST VI    Your last dose was:      Your next dose is:          Check sugars twice daily                  lancets Misc    Your last dose was:      Your next dose is:          Check sugars twice daily                  losartan 100 mg tablet  Commonly known as:  COZAAR    Your last dose was:      Your next dose is:          TAKE 1 TABLET BY MOUTH DAILY FOR HYPERTENSION                  magnesium oxide 400 mg tablet  Commonly known as:  MAG-OX    Your last dose was:      Your next dose is:          TAKE 1 TABLET BY MOUTH DAILY                  montelukast 10 mg tablet  Commonly known as:  SINGULAIR    Your last dose was:      Your next dose is:          TAKE 1 TABLET BY MOUTH DAILY                  nabumetone 750 mg tablet  Commonly known as:  RELAFEN    Your last dose was:      Your next dose is:          TAKE 1 TABLET BY MOUTH TWICE DAILY AS NEEDED FOR PAIN. USE SPARINGLY                  polyethylene glycol 17 gram packet  Commonly known as:  MIRALAX    Your last dose was:      Your next dose is:          Take 1 Packet by mouth daily.   17 g  QUEtiapine SR 150 mg sr tablet  Commonly known as:  SEROquel XR    Your last dose was:      Your next dose is:          TAKE 1 TABLET BY MOUTH NIGHTLY                  spironolactone 100 mg tablet  Commonly known as:  ALDACTONE    Your last dose was:      Your next dose is:          TAKE 1 TABLET BY MOUTH DAILY                  tiotropium bromide 2.5 mcg/actuation inhaler  Commonly known as:  [place]    Your last dose was:       Your next dose is:          Take 1 Puff by inhalation daily.   1 Puff                 traZODone 50 mg tablet  Commonly known as:  DESYREL    Your last dose was:      Your next dose is:          Take 1 Tab by mouth nightly as needed for Sleep.   50 mg                 triamcinolone 55 mcg nasal inhaler  Commonly known as:  NASACORT    Your last dose was:      Your next dose is:          2 Sprays by Both Nostrils route daily.   2 Spray                       Where to Get Your Medications      These medications were sent to [facility]  [address], [place] [address]    Phone:  [phone]   ?? rosuvastatin 5 mg tablet     Information on where to get these meds will be given to you by the nurse or doctor.    Ask your nurse or doctor about these medications  ?? albuterol 90 mcg/actuation inhaler  ?? azithromycin 500 mg Tab  ?? cefdinir 300 mg capsule  ?? metFORMIN ER 750 mg tablet             Follow-up Appointments and instructions:   Your PCP: Wallene Huh, PA-C, within 3-5 days  Follow-up Information     Follow up With Specialties Details Why Contact Info    Wallene Huh, PA-C Physician Assistant   [address]  [place] [address]  [phone]              Consults: Nephrology and Pulmonary/Critical Care    Treatment Team: Treatment Team: Attending Provider: Otila Kluver, MD; Consulting Provider: Otila Kluver, MD; Physical Therapist: Case, Lajean Manes, DPT; Occupational Therapist: Beverly Gust, OT; Care Manager: Cleotilde Neer., RN        Thank you Dr Wallene Huh, PA-C for allowing [place] to participate in the care of Gray Bernhardt       >42 minutes spent coordinating this discharge (review instructions/follow-up, prescriptions)    Signed:    [facility]  Otila Kluver,  MD  09/14/2017  10:27 AM

## 2017-09-14 NOTE — Other (Addendum)
----------  DocumentID: [id number]------------------------------------------------              [facility]                       Patient Education Report         Name: [name] [name], [name] [name]                  Date: 09/09/2017    MRN: [id number]                    Time: 10:36:16 AM         Patient ordered video: 'Patient Safety: Stay Safe While you are in the Hospital'    from [id number] via phone number: [phone] at 10:36:16 AM    Description: This program outlines some of the precautions patients can take to ensure a speedy recovery without extra complications. The video emphasizes the importance of communicating with the healthcare team.    ----------DocumentID: [id number]------------------------------------------------                       [facility]          Patient Education Report - Discharge Summary        Date: 09/14/2017   Time: 3:56:14 PM   Name: [name] [name], [name] [name]   MRN: [id number]      Account Number: [id number]      Education History:        Patient ordered video: 'Patient Safety: Stay Safe While you are in the Hospital' from [id number] on 09/09/2017 10:36:16 AM

## 2017-09-14 NOTE — Progress Notes (Signed)
INTERNAL MEDICINE                                                                   Daily Progress Note  Patient:  [name] [name]  Today date: September 14, 2017  Date of Admission:  09/08/2017    Interval History and Events of the last 24 hours:    Reports improved SOB. No Nausea and vomiting, afebrile  ??      Assessment/Plan:       Patient seen in follow up for multiple medical problems as listed below :  1.??Sepsis due to UTI-Pneumonia:??  -UCx reports E.coli   -Continue  rocephin.  -Blood Cxs with ngtd Sputums Cx with only normal flora so far  -Procal of 0.38   -off Levophed.  ??  2. ??Metabolic encephalopathy: Resolved  -Favor Sepsis over medication effect at this point  -Orients x3.  -Did not respond to narcan in ED.  -NH3 and TSH normal.  -No acute findings reported on head CT. ??  -UDS is positive only for marijuana. ??  -Serum alcohol level negative, negative acetaminophen level, salicylate level is not elevated. ??  ??  3.????Intubation for airway protection on intake, Extubated on 3/30:   Wean O2 as tolerated    4. Pneumonia  CXR: Developing right lower lobe infiltrate.   Rocephin and Azithromycin  ??  4. Hyperkalemia: Resolved  -Dialyzed on 3/29-PM after K+ did not improve with medical mgmt.  -Monitor with renal failure  -nephrology following-has ordered 0.45% NS with 35mq of K+  ??  5. 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  -DDX-NSAID use, N/V, Medications-home Cozaar, Aldactone.  -Urine output  increasing  -Strict I/O.??  -IVF per nephro  -Udall Cath removed   ??  ??  6. UTI due to E.coli:??  -Possible source for #1,   -UCx as above, Abx as above  ??  7. ??COPD:   -Frequent nebs  -continue singulair.  -Pulm has changed Breo to pulmicort.  -Monitor s/p Extubation.  ??  8.??GERD:??  -Protonix for GI prophylaxis.  ??   9. Chronic R hip pain:   -Hold nabumetone, gabapentin. ??  -hold Seroquel and Trazodone until sure mentation stablizies.  -Fall reported on AM of 3/31-assisted down per nursing  -PT  ??  10. Hep C: Chronic  ??  .  11. HTN-chornic, but recently off pressors  -holding home meds including Aldactone, Losartan, Norvasc, Clonidine 0.3 BID.  -if needed, consider norvasc first.  ??  13. DM2:  -Glucommander to PO regimen  -A1c of 9.5 in 07/2017  ??  14. HLD  -continue Crestor  ??  PPX: Heparin TID  ??  Dispo: Continue to monitor renal function and urine output.  F/up recs from nephro. F/up Blood Cxs. Therapy consults. Pulm has written orders for Medical bed.  ??        DVT Prophylaxis:    Code Status:  .Full Code     Disposition and Family:   Recommend to continue hospitalization. Discussed with patient and nursing staff.    Anticipated Date of Discharge: 1 day  Anticipated  Disposition (home, SNF) : SNF or HH    Labwork and Ancillary Studies     All labs/tests/imaging reviewed.Spoke with the nurse regarding patient issues.    Labwork:    CBC w/Diff   Lab Results   Component Value Date/Time    WBC 11.6 (H) 09/14/2017 06:50 AM    HGB 9.9 (L) 09/14/2017 06:50 AM    HCT 29.5 (L) 09/14/2017 06:50 AM    PLATELET 192 09/14/2017 06:50 AM    MCV 87.3 09/14/2017 06:50 AM        Basic Metabolic Profile   Lab Results   Component Value Date/Time    Sodium 140 09/14/2017 06:50 AM    Potassium 3.7 09/14/2017 06:50 AM    Chloride 108 (H) 09/14/2017 06:50 AM    CO2 23 09/14/2017 06:50 AM    Anion gap 8 09/14/2017 06:50 AM    Glucose 110 (H) 09/14/2017 06:50 AM    BUN 10 09/14/2017 06:50 AM    Creatinine 0.9 09/14/2017 06:50 AM    BUN/Creatinine ratio 24 (H) 06/10/2016 01:00 PM    GFR est AA >60.0 09/14/2017 06:50 AM    GFR est non-AA >60 09/14/2017 06:50 AM    Calcium 8.1 (L) 09/14/2017 06:50 AM        Cardiac Enzymes   Lab Results   Component Value Date/Time    CK 165 09/08/2017 09:30 AM    CK-MB Index 0.4 09/08/2017 09:30 AM     Troponin-I <0.015 09/08/2017 09:30 AM      Arterial Blood Gases   No results found for: PH, PHI, PCO2, PCO2I, PO2, PO2I, HCO3, HCO3I, FIO2, FIO2I   Coagulation    No results found for: APTT, PTP, INR   Hepatic Function   Lab Results   Component Value Date/Time    AST (SGOT) 11 (L) 09/09/2017 08:00 AM    Alk. phosphatase 53 09/09/2017 08:00 AM    Bilirubin, direct 0.2 09/09/2017 12:05 AM            XR Results (most recent):  Results from [place] encounter on 09/08/17   XR CHEST PA LAT    Narrative CHEST X-RAY, (frontal and lateral view):     INDICATION: Pneumonia      COMPARISON: 09/08/2017, 08/06/2017      Impression IMPRESSION:   Left sided central venous catheter terminates at the cavoatrial junction.   Cardiomediastinal outline shows tortuous aorta with similar appearance to prior  exam 09/08/2017.  Left lung nodule similar to recent CT 08/06/2017.  Large bullae  present bilaterally.  ETT no longer seen.  Interval improvement in right lower  lung zone opacity.  Scarring seen in the right middle lobe.         MRI Results (most recent):  Results from [place] encounter on 04/07/15   MRI BRAIN W WO CONT    Narrative CLINICAL HISTORY:  Abnormal CT, meningioma    PROCEDURE: MRI brain without and with contrast    COMPARISON: Head CT dated 04/08/2015    FINDINGS:    There is age-related cerebral atrophy. There is extensive chronic small vessel  ischemic change, advanced for patient age. As noted on CT, there is a enhancing  extra-axial mass in the left CP angle along the petrous temporal bone measuring  10 mm. This is observed on image 9 series 12, image 6 series 11 and image 21  series 13. There are dural tails extending to the tentorium cerebelli. Imaging  findings would favor meningioma. No other  areas of abnormal brain parenchymal  enhancement. The sella, pineal region, craniocervical junction are unremarkable.  Skull base flow voids are patent.      Impression IMPRESSION:   1. Enhancing mass in the left posterior fossa adjacent to the petrous temporal  bone as described above. Main consideration would include meningioma. Other less  likely differential considerations would include dural metastasis and lymphoma.  Consider surveillance or further evaluation.    2.  Chronic small vessel ischemic changes, advanced for patient age.         CT Results (most recent):  Results from Hospital Encounter encounter on 09/08/17   CT HEAD WO CONT    Narrative INDICATION:  Altered mental status.        COMPARISON:  None    TECHNIQUE:  The CT scan of the head was performed without contrast. Coronal and sagittal  reconstructions were obtained.  DICOM format image data is available to non-affiliated external healthcare  facilities or entities on a secure, media free, reciprocally searchable basis  with patient authorization for 12 months following the date of the study.    FINDINGS:  The ventricular system shows normal dimensions and morphology. The white and  gray matter is well differentiated. Periventricular confluent white matter  hypoattenuation. Rounded 11.6 mm hypodensity in the left posterior fossa  adjacent to the left petrous bone and the tentorium suggesting meningioma. This  is unchanged. There is no intracranial bleed or evidence of an acute infarct.   No identification of a fracture. Mild ethmoid sinusitis.             Impression IMPRESSION:   1. Chronic microvascular white matter ischemic disease.  2. Small unchanged rounded hyperdense lesion in the left posterior fossa  adjacent to the petrous bone and tentorium likely meningioma.  3. Mild ethmoid sinusitis.                Current Inpatient Meds and Allergies     Medications:  Current Facility-Administered Medications   Medication Dose Route Frequency   ??? oxyCODONE-acetaminophen (PERCOCET) 5-325 mg per tablet 1 Tab  1 Tab Oral Q6H PRN   ??? amLODIPine (NORVASC) tablet 10 mg  10 mg Oral DAILY    ??? azithromycin (ZITHROMAX) tablet 500 mg  500 mg Oral DAILY   ??? losartan (COZAAR) tablet 100 mg  100 mg Oral DAILY   ??? cloNIDine HCl (CATAPRES) tablet 0.3 mg  0.3 mg Oral BID   ??? pantoprazole (PROTONIX) tablet 40 mg  40 mg Oral ACB   ??? 0.45% sodium chloride with KCl 20 mEq/L infusion   IntraVENous CONTINUOUS   ??? cefTRIAXone (ROCEPHIN) 1 g in 0.9% sodium chloride (MBP/ADV) 50 mL MBP  1 g IntraVENous Q24H   ??? morphine injection 2 mg  2 mg IntraVENous Q4H PRN   ??? dextrose (D50) infusion 5-25 g  10-50 mL IntraVENous PRN   ??? glucagon (GLUCAGEN) injection 1 mg  1 mg IntraMUSCular PRN   ??? insulin lispro (HUMALOG) injection 1-100 Units  1-100 Units SubCUTAneous AC&HS   ??? insulin lispro (HUMALOG) injection 1-100 Units  1-100 Units SubCUTAneous PRN   ??? budesonide (PULMICORT) 500 mcg/2 ml nebulizer suspension  500 mcg Nebulization BID RT   ??? dextrose (D50W) injection syrg 25 g  25 g IntraVENous PRN   ??? montelukast (SINGULAIR) tablet 10 mg  10 mg Oral QHS   ??? rosuvastatin (CRESTOR) tablet 5 mg  5 mg Oral QHS   ??? tiotropium bromide (SPIRIVA RESPIMAT) 2.5 mcg Benay Pike  1 Puff Inhalation DAILY   ??? white petrolatum-mineral oil (GENTEAL PM) 85-15 % ophthalmic ointment 1 Each  1 Each Both Eyes DAILY   ??? naloxone (NARCAN) injection 0.1 mg  0.1 mg IntraVENous PRN   ??? acetaminophen (TYLENOL) tablet 650 mg  650 mg Oral Q4H PRN    Or   ??? acetaminophen (TYLENOL) solution 650 mg  650 mg Oral Q4H PRN    Or   ??? acetaminophen (TYLENOL) suppository 650 mg  650 mg Rectal Q4H PRN   ??? ondansetron (ZOFRAN) injection 4 mg  4 mg IntraVENous Q4H PRN   ??? albuterol-ipratropium (DUO-NEB) 2.5 MG-0.5 MG/3 ML  3 mL Nebulization Q6H RT   ??? heparin (porcine) injection 5,000 Units  5,000 Units SubCUTAneous Q8H   ??? insulin glargine (LANTUS) injection 1-100 Units  1-100 Units SubCUTAneous QHS   ??? albuterol-ipratropium (DUO-NEB) 2.5 MG-0.5 MG/3 ML  3 mL Nebulization Q4H PRN   ??? heparin (porcine) pf 50 Units  50 Units InterCATHeter DAILY    ??? heparin (porcine) pf 50 Units  50 Units InterCATHeter PRN   ??? sodium chloride (NS) flush 10 mL  10 mL InterCATHeter DAILY   ??? sodium chloride (NS) flush 10 mL  10 mL InterCATHeter PRN          Objective:       Visit Vitals  BP 131/88 (BP 1 Location: Right arm, BP Patient Position: Supine)   Pulse 96   Temp 98.6 ??F (37 ??C)   Resp 16   Ht 5' 8" (1.727 m)   Wt 101.5 kg (223 lb 12.3 oz)   LMP  (Exact Date)   SpO2 94%   Breastfeeding? No   BMI 34.02 kg/m??     Body mass index is 34.02 kg/m??.      Intake/Output Summary (Last 24 hours) at 09/14/2017 0816  Last data filed at 09/14/2017 0624  Gross per 24 hour   Intake 2142.46 ml   Output ???   Net 2142.46 ml       General: ?? Alert, cooperative, no distress, appears stated age.??   Lungs:?? ?? Clear, no  wheeze, rhonchi, rales   Chest wall: ?? No tenderness or deformity.??   Heart: ?? Regular rate and rhythm, S1, S2 normal, no murmur, click, rub or gallop.??   Abdomen:?? ?? Soft, non-tender. Bowel sounds normal. No masses,?? No organomegaly.??       Musculoskeleta : Normal range of motion in most of the joints   Extremities:?? Extremities normal, atraumatic, no cyanosis or edema.??   Pulses:?? 2+ and symmetric all extremities.??   Skin:?? Skin color, texture, turgor normal. No rashes or lesions??   Neurologic  Psych:?? : CNII-XII intact.    Normal affect and mood . No thoughts of harm to self or others??             Total time spent with chart review, patient examination/education, discussion with staff on case,documentation and medication management / adjustment:   >35 minutes    It is always a pleasure to be involved in the clinical care of this patient.    Otila Kluver, MD  September 14, 2017  [facility] Internal Medicine  Pager:  [phone]

## 2017-09-14 NOTE — Progress Notes (Signed)
CM saw discharge order. Spoke with pt in room. [facility]. [facility] and [facility].  Family will transport home.                        Juanetta Beetsonstance Voodre RN

## 2017-09-14 NOTE — Progress Notes (Signed)
I visited with the PT because a consult order was put in.  The PT informed me that she is doing a lot better than she was yesterday.  She informed me that she was very emotional yesterday and that she is doing better today because she will be discharged.  She did not want to talk about why she was emotional yesterday but she was very glad for the visit.  I was able to offer a word of prayer for her in addition to a ministry of presence and a listening ear.  No follow up visit needed as the PT will be discharged today.

## 2017-09-14 NOTE — Discharge Summary (Signed)
Discharge Summary by Otila Kluver, MD at 09/14/17 1027                Author: Otila Kluver, MD  Service: Hospitalist  Author Type: Physician       Filed: 09/14/17 1033  Date of Service: 09/14/17 1027  Status: Signed          Editor: Otila Kluver, MD (Physician)               Discharge Summary             Patient: [name] [name]  Age: 67 y.o.  DOB: 12/21/1950 MR#:  [id number] SSN:  [id number]   PCP on record: Wallene Huh, PA-C   Admit date: 09/08/2017   Discharge date: 09/14/2017      Admission Diagnoses:    Acute renal failure (ARF) ([place]) [N17.9]   Hyperkalemia [E87.5]   Obtunded [R40.1]   Septic shock ([place]) [A41.9, B34.19]   Metabolic encephalopathy [F79.02]   -     Discharge Diagnoses:         Sepsis??due to UTI-Pneumonia:??   Metabolic encephalopathy:??Resolved   Intubation for airway protection??   Pneumonia   UTI   Hyperkalemia:????   Acute renal failure    COPD:    GERD:??   Chronic R hip pain:    Hep C: Chronic   HTN-chornic.   DM2:   HLD                 Medical History     [name] [name] is a 67 y.o. AA female with past medical history of chronic pain related to the right hip, COPD, diabetes, hypertension, hepatitis C, GERD,  pulmonary nodules, PTSD, and marijuana use, who presents with altered mentation, acute kidney injury, and hyperkalemia.  The patient is able to tell me her name and briefly follow some commands but is plan for intubation due to airway protection. She  is unable to provide further history.  Most of her history is provided by her spouse at bedside.  She reports she was in her usual state of health prior to yesterday but yesterday began to develop increased vomiting and nausea.  He described a producing  mostly foamy white emesis.  This morning he found her having fallen out of bed.  He reports she has chronic pain related to the right hip for which she has had surgery in the past about 2 years ago.  He reports that she may have overused pain medication  with reporting that she appears to be  on Nabumetone. Her husband also reports some foul-smelling urine. In the emergency department she was found to have a potassium level of 8, sodium level 127, BUN and creatinine of 67 and 7.6, which was normal about  1 month ago.  Also leukocytosis.   She has been started on treatment for sepsis as well as hyperkalemia and is requested for ICU admission.,                  Hospital Course by Problem     Patient seen in follow up for multiple medical problems as listed below :   1.??Sepsis??due to UTI-Pneumonia:??   -UCx reports E.coli    -Continue  rocephin.   -Blood Cxs??with ngtd Sputums Cx with only normal flora so far   -Procal of 0.38    -Sepsis resolved   ??   2. ??Metabolic encephalopathy:??Resolved   -Favor Sepsis over medication effect at this point   -Orients x3.   -  Did not respond to narcan in ED.   -NH3 and TSH normal.   -No acute findings reported on head CT. ??   -UDS is positive only for marijuana. ??   -Serum alcohol level negative, negative acetaminophen level, salicylate level is not elevated.    Resolved ??   ??   3.????Intubation for airway protection??on intake,??Extubated??on 3/30:??   Wean O2 as tolerated   ??   4. Pneumonia   CXR: Developing right lower lobe infiltrate.    Rocephin and Azithromycin   Change to Cefdinir and Azith   ??   4. Hyperkalemia:??Resolved   -Dialyzed on 3/29-PM after K+ did not improve with medical mgmt.   -Monitor with renal failure   -nephrology following-has ordered 0.45% NS with 56mq of K+   ??   5. ??Acute renal failure   -Baseline BUN and creatinine of 16 and 0.9 on February 27 of this year, presented at that time with mild AKI with a creatinine of 1.8.??   -DDX-NSAID use, N/V, Medications-home Cozaar, Aldactone.   -Urine output ??increasing   -Strict I/O.??   -IVF per nephro   -Udall Cath removed    ??-Resolved    ??   6. UTI??due to E.coli:??   -Possible source for #1,    -UCx as above, Abx as above   ??   7. ??COPD:    -Frequent nebs   -continue singulair.   -Pulm has changed Breo to  pulmicort.   -Monitor s/p Extubation.   ??   8.??GERD:??   -Protonix for GI prophylaxis.   ??   9. Chronic R hip pain:    -Hold nabumetone, gabapentin. ??   -hold Seroquel and Trazodone until sure mentation stablizies.   -Fall reported on AM of 3/31-assisted down per nursing   -PT   ??   10. Hep C: Chronic   ??      11. HTN-chornic, but recently off pressors   -Resume home meds including Aldactone, Losartan, Norvasc, Clonidine 0.3 BID.   ??   13. DM2:   -Resume home medication    -A1c of 9.5 in 07/2017   ??   14. HLD   -continue Crestor   ??   Stable to D/C home with PT/HH              Today's examination of the patient revealed:          Objective:     VS:    Visit Vitals      BP  131/88 (BP 1 Location: Right arm, BP Patient Position: Supine)     Pulse  96     Temp  98.6 ??F (37 ??C)     Resp  16     Ht  '5\' 8"'$  (1.727 m)     Wt  101.5 kg (223 lb 12.3 oz)     LMP   (Exact Date)     SpO2  94%     Breastfeeding?  No        BMI  34.02 kg/m??         Tmax/24hrs: Temp (24hrs), Avg:98.9 ??F (37.2 ??C), Min:98.3 ??F  (36.8 ??C), Max:99.4 ??F (37.4 ??C)       Input/Output:       Intake/Output Summary (Last 24 hours) at 09/14/2017 1027   Last data filed at 09/14/2017 0624     Gross per 24 hour        Intake  1902.46 ml  Output  --        Net  1902.46 ml           General appearance: no distress   Eyes: sclera anicteric   ENT: no oral lesions, thyroid normal   Skin: no spider angiomata, jaundice,    Respiratory: clear to auscultation bilaterally   Cardiovascular: regular heart rate, no murmurs, no JVD   Abdomen: soft, non-tender, no rebound   Extremities: no muscle wasting, no gross arthritic changes   GU: not examined   Neurologic: alert and oriented, cranial nerves grossly intact,             Labs:       Recent Results (from the past 24 hour(s))     GLUCOSE, POC          Collection Time: 09/13/17 12:15 PM         Result  Value  Ref Range            Glucose (POC)  155 (H)  65 - 105 mg/dL       GLUCOSE, POC          Collection Time: 09/13/17   5:24 PM         Result  Value  Ref Range            Glucose (POC)  99  65 - 105 mg/dL       GLUCOSE, POC          Collection Time: 09/13/17  9:39 PM         Result  Value  Ref Range            Glucose (POC)  119 (H)  65 - 105 mg/dL       METABOLIC PANEL, BASIC          Collection Time: 09/14/17  6:50 AM         Result  Value  Ref Range            Sodium  140  136 - 145 mEq/L       Potassium  3.7  3.5 - 5.1 mEq/L       Chloride  108 (H)  98 - 107 mEq/L       CO2  23  21 - 32 mEq/L       Glucose  110 (H)  74 - 106 mg/dl       BUN  10  7 - 25 mg/dl       Creatinine  0.9  0.6 - 1.3 mg/dl       GFR est AA  >60.0          GFR est non-AA  >60          Calcium  8.1 (L)  8.5 - 10.1 mg/dl       Anion gap  8  5 - 15 mmol/L       CBC WITH AUTOMATED DIFF          Collection Time: 09/14/17  6:50 AM         Result  Value  Ref Range            WBC  11.6 (H)  4.0 - 11.0 1000/mm3       RBC  3.38 (L)  3.60 - 5.20 M/uL       HGB  9.9 (L)  13.0 - 17.2 gm/dl       HCT  29.5 (L)  37.0 - 50.0 %  MCV  87.3  80.0 - 98.0 fL       MCH  29.3  25.4 - 34.6 pg       MCHC  33.6  30.0 - 36.0 gm/dl       PLATELET  192  140 - 450 1000/mm3       MPV  10.0  6.0 - 10.0 fL       RDW-SD  42.5  36.4 - 46.3         NRBC  0  0 - 0         IMMATURE GRANULOCYTES  0.5  0.0 - 3.0 %       NEUTROPHILS  65.1 (H)  34 - 64 %       LYMPHOCYTES  24.0 (L)  28 - 48 %       MONOCYTES  8.8  1 - 13 %       EOSINOPHILS  1.3  0 - 5 %       BASOPHILS  0.3  0 - 3 %       GLUCOSE, POC          Collection Time: 09/14/17  8:15 AM         Result  Value  Ref Range            Glucose (POC)  100  65 - 105 mg/dL        Additional Data Reviewed:         XR Results (most recent):     Results from [facility] encounter on 09/08/17     XR CHEST PA LAT           Narrative  CHEST X-RAY, (frontal and lateral view):       INDICATION: Pneumonia        COMPARISON: 09/08/2017, 08/06/2017              Impression  IMPRESSION:    Left sided central venous catheter terminates at the  cavoatrial junction.    Cardiomediastinal outline shows tortuous aorta with similar appearance to prior   exam 09/08/2017.  Left lung nodule similar to recent CT 08/06/2017.  Large bullae   present bilaterally.  ETT no longer seen.  Interval improvement in right lower   lung zone opacity.  Scarring seen in the right middle lobe.              MRI Results (most recent):     Results from [place] encounter on 04/07/15     MRI BRAIN W WO CONT           Narrative  CLINICAL HISTORY:  Abnormal CT, meningioma      PROCEDURE: MRI brain without and with contrast      COMPARISON: Head CT dated 04/08/2015      FINDINGS:      There is age-related cerebral atrophy. There is extensive chronic small vessel   ischemic change, advanced for patient age. As noted on CT, there is a enhancing   extra-axial mass in the left CP angle along the petrous temporal bone measuring   10 mm. This is observed on image 9 series 12, image 6 series 11 and image 21   series 13. There are dural tails extending to the tentorium cerebelli. Imaging   findings would favor meningioma. No other areas of abnormal brain parenchymal   enhancement. The sella, pineal region, craniocervical junction are unremarkable.   Skull base flow voids are patent.  Impression  IMPRESSION:   1. Enhancing mass in the left posterior fossa adjacent to the petrous temporal   bone as described above. Main consideration would include meningioma. Other less   likely differential considerations would include dural metastasis and lymphoma.   Consider surveillance or further evaluation.      2.  Chronic small vessel ischemic changes, advanced for patient age.              CT Results (most recent):     Results from Hospital Encounter encounter on 09/08/17     CT HEAD WO CONT           Narrative  INDICATION:   Altered mental status.          COMPARISON:   None      TECHNIQUE:   The CT scan of the head was performed without contrast. Coronal and sagittal    reconstructions were obtained.   DICOM format image data is available to non-affiliated external healthcare   facilities or entities on a secure, media free, reciprocally searchable basis   with patient authorization for 12 months following the date of the study.      FINDINGS:   The ventricular system shows normal dimensions and morphology. The white and   gray matter is well differentiated. Periventricular confluent white matter   hypoattenuation. Rounded 11.6 mm hypodensity in the left posterior fossa   adjacent to the left petrous bone and the tentorium suggesting meningioma. This   is unchanged. There is no intracranial bleed or evidence of an acute infarct.    No identification of a fracture. Mild ethmoid sinusitis.                       Impression  IMPRESSION:    1. Chronic microvascular white matter ischemic disease.   2. 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   3. Mild ethmoid sinusitis.                           Condition:    Stable         Diet:     DM diet     Disposition:      _0 Home   _1 Home with Home Health   _2 SNF/NH   _3 Rehab   _4 Home with family    _5  Alternate Facility:____________________           Discharge Medications:                 My Medications              START taking these medications                 Instructions  Each Dose to Equal  Morning  Noon  Evening  Bedtime             azithromycin 500 mg Tab   Commonly known as:  ZITHROMAX      Your last dose was:        Your next dose is:              Take 1 Tab by mouth daily.     500 mg  cefdinir 300 mg capsule   Commonly known as:  OMNICEF      Your last dose was:        Your next dose is:              Take 1 Cap by mouth two (2) times a day.     300 mg                                   CHANGE how you take these medications                 Instructions  Each Dose to Equal  Morning  Noon  Evening  Bedtime              * albuterol  1.25 mg/3 mL Nebu   Commonly known as:  ACCUNEB   What changed:  Another medication with the same name was changed. Make sure you understand how and when to take each.      Your last dose was:        Your next dose is:              3 mL by Nebulization route every six (6) hours as needed.     1.25 mg                                    * albuterol  90 mcg/actuation inhaler   Commonly known as:  PROVENTIL HFA, VENTOLIN HFA, PROAIR HFA   Start taking on:  09/14/2017   What changed:  See the new instructions.      Your last dose was:        Your next dose is:              Take 2 Puffs by inhalation every six (6) hours for 5 days, THEN 2 Puffs every four (4) hours as needed for Wheezing or Shortness of Breath for up  to 30 days.                                      metFORMIN ER 750 mg tablet   Commonly known as:  GLUCOPHAGE XR   Start taking on:  09/18/2017   What changed:     ??  These instructions start on 09/18/2017. If you are unsure what to do until then, ask your doctor or other care provider.   ??  Another medication with the same name was removed. Continue taking this medication, and follow the directions you see here.      Your last dose was:        Your next dose is:              Take 1 Tab by mouth two (2) times a day.     750 mg                            * rosuvastatin  5 mg tablet   Commonly known as:  CRESTOR   What changed:  Another medication with the same name was added. Make sure you understand how and when to take each.      Your last dose was:  Your next dose is:              TAKE 1 TABLET BY MOUTH NIGHTLY                                      * rosuvastatin  5 mg tablet   Commonly known as:  CRESTOR   What changed:  You were already taking a medication with the same name, and this prescription was added. Make sure you understand how and  when to take each.      Your last dose was:        Your next dose is:              TAKE 1 TABLET BY MOUTH NIGHTLY                                       * This list has 4 medication(s) that are the same as other medications prescribed for you. Read the directions carefully, and ask your  doctor or other care provider to review them with you.                             CONTINUE taking these medications                 Instructions  Each Dose to Equal  Morning  Noon  Evening  Bedtime             amLODIPine 10 mg tablet   Commonly known as:  NORVASC      Your last dose was:        Your next dose is:              TAKE ONE TABLET BY MOUTH ONCE DAILY                                      benzonatate 100 mg capsule   Commonly known as:  TESSALON PERLES      Your last dose was:        Your next dose is:              Take 1 Cap by mouth three (3) times daily as needed for Cough.     100 mg                                    Blood-Glucose Meter monitoring kit      Your last dose was:        Your next dose is:              Check sugars 2 times per day, fasting and 2 hours after dinner                                      cloNIDine HCl 0.3 mg tablet   Commonly known as:  CATAPRES      Your last dose was:        Your next dose is:  TAKE ONE TABLET BY MOUTH TWICE DAILY                                      fluticasone propion-salmeterol 100-50 mcg/dose diskus inhaler   Commonly known as:  ADVAIR DISKUS      Your last dose was:        Your next dose is:              Take 1 Puff by inhalation two (2) times a day.     1 Puff                            gabapentin 100 mg capsule   Commonly known as:  NEURONTIN      Your last dose was:        Your next dose is:              Take 100 mg by mouth two (2) times a day.     100 mg                            glucose blood VI test strips strip   Commonly known as:  ASCENSIA AUTODISC VI, ONE TOUCH ULTRA TEST VI      Your last dose was:        Your next dose is:              Check sugars twice daily                                      lancets Misc      Your last dose was:        Your next dose is:              Check sugars twice  daily                                      losartan 100 mg tablet   Commonly known as:  COZAAR      Your last dose was:        Your next dose is:              TAKE 1 TABLET BY MOUTH DAILY FOR HYPERTENSION                                      magnesium oxide 400 mg tablet   Commonly known as:  MAG-OX      Your last dose was:        Your next dose is:              TAKE 1 TABLET BY MOUTH DAILY                                      montelukast 10 mg tablet   Commonly known as:  SINGULAIR      Your last dose was:        Your next dose is:  TAKE 1 TABLET BY MOUTH DAILY                              nabumetone 750 mg tablet   Commonly known as:  RELAFEN      Your last dose was:        Your next dose is:              TAKE 1 TABLET BY MOUTH TWICE DAILY AS NEEDED FOR PAIN. USE SPARINGLY                                      polyethylene glycol 17 gram packet   Commonly known as:  MIRALAX      Your last dose was:        Your next dose is:              Take 1 Packet by mouth daily.     17 g                                    QUEtiapine SR 150 mg sr tablet   Commonly known as:  SEROquel XR      Your last dose was:        Your next dose is:              TAKE 1 TABLET BY MOUTH NIGHTLY                                      spironolactone 100 mg tablet   Commonly known as:  ALDACTONE      Your last dose was:        Your next dose is:              TAKE 1 TABLET BY MOUTH DAILY                                      tiotropium bromide 2.5 mcg/actuation inhaler   Commonly known as:  [place]      Your last dose was:        Your next dose is:              Take 1 Puff by inhalation daily.     1 Puff                            traZODone 50 mg tablet   Commonly known as:  DESYREL      Your last dose was:        Your next dose is:              Take 1 Tab by mouth nightly as needed for Sleep.     50 mg                                    triamcinolone 55 mcg nasal inhaler   Commonly known as:  NASACORT      Your last dose was:  Your next dose is:              2 Sprays by Both Nostrils route daily.     2 Spray                                          Where to Get Your Medications               These medications were sent to [facility]    [address] [place] [address]          Phone:  [phone]     ??  rosuvastatin 5 mg tablet            Information on where to get these meds will be given to you by the nurse or doctor.       Ask your nurse or doctor  about these medications   ??  albuterol 90 mcg/actuation inhaler   ??  azithromycin 500 mg Tab   ??  cefdinir 300 mg capsule   ??  metFORMIN ER 750 mg tablet                        Follow-up Appointments and instructions:     Your PCP: Wallene Huh, PA-C, within 3-5 days     Follow-up Information               Follow up With  Specialties  Details  Why  Contact Info              Wallene Huh, PA-C  Physician Assistant      [address]   [place] [address]   [phone]                      Consults: Nephrology and Pulmonary/Critical Care      Treatment Team: Treatment Team: Attending Provider: Otila Kluver, MD; Consulting Provider: Otila Kluver, MD; Physical Therapist: Case,  Lajean Manes, DPT; Occupational Therapist: Beverly Gust, OT; Care Manager: Cleotilde Neer., RN            Thank you Dr Wallene Huh, PA-C for allowing [place] to participate in the care of Gray Bernhardt          >42 minutes spent coordinating this discharge (review instructions/follow-up, prescriptions)      Signed:      [facility]   Otila Kluver, MD   09/14/2017   10:27 AM

## 2017-09-15 ENCOUNTER — Encounter: Attending: Physician Assistant | Primary: Physician Assistant

## 2017-09-15 NOTE — Progress Notes (Signed)
Hospital Discharge Follow-Up      Date/Time:  09/15/2017 4:25 PM    Patient was admitted to [facility] on 09/08/17 and discharged on 09/14/17 for urosepsis. The physician discharge summary was available at the time of outreach.  Patient was contacted within 1 business days of discharge.      Top Challenges reviewed with the provider   Feeling warn down - patient very tired just got back from hospital yesterday per husband.         Method of communication with provider :chart routing    Inpatient RRAT score: High Risk            34       Total Score        3 Has Seen PCP in Last 6 Months (Yes=3, No=0)    2 Married. Living with Significant Other. Assisted Living. LTAC. SNF. or   Rehab    4 IP Visits Last 12 Months (1-3=4, 4=9, >4=11)    25 Charlson Comorbidity Score (Age + Comorbid Conditions)        Criteria that do not apply:    Patient Length of Stay (>5 days = 3)    Pt. Coverage (Medicare=5 , Medicaid, or Self-Pay=4)      Was this a readmission? yes   Patient stated reason for the readmission: husband was unable to wake patient    Nurse Navigator (NN) contacted the family by telephone to perform post hospital discharge assessment. Verified name and DOB with family as identifiers. Provided introduction to self, and explanation of the Nurse Navigator role.     Reviewed discharge instructions and red flags with family who verbalized understanding. Family given an opportunity to ask questions and does not have any further questions or concerns at this time. The family agrees to contact the PCP office for questions related to their healthcare. NN provided contact information for future reference.    Disease Specific:   Sepsis  Sepsis Note     Most recent vital signs: Visit Vitals  BP 131/88 (BP 1 Location: Right arm, BP Patient Position: Supine)   Pulse 96   Temp 98.6 ??F (37 ??C)   Resp 16   Ht 5' 8"  (1.727 m)   Wt 101.5 kg (223 lb 12.3 oz)   LMP  (Exact Date)   SpO2 94%   Breastfeeding? No    BMI 34.02 kg/m??     Source of Infection:  Urinary tract infection     Antibiotic prescribed at discharge: Azithromycin, and Cefdinir Length of remaining treatment: 4 days  Are you taking Jaquarius Seder antibiotic as prescribed? yes     Infectious Disease Consult during admission: no     In the last 24 hour have you experienced;    Fever no    Low body temperature no    Chills or shaking no    Sweating no    Fast heart rate no    Fast breathing no    Dizziness/lightheadedness no    Confusion or unusual change in mental status no    Diarrhea no    Nausea no    Vomiting no    Shortness of breath or difficulty breathing no    Less urine output no    Cold, clammy, and pale skin no     Skin rash or skin color changes no     If the patients has two or more symptoms present notify the primary care provider of the concern for sepsis.  Summary of patient's top problems:  1. Feeling warn down - patient very tired just got back from hospital yesterday per husband.      Home Health orders at discharge: PT, OT, [place]: [facility]      Barriers to care? no bariers to care    Advance Care Planning:   Does patient have an Advance Directive:  reviewed and current     Medication(s):   New Medications at Discharge:   azithromycin 500 mg Tab  Commonly known as:  ZITHROMAX  ??  Bayan Kushnir last dose was:    ??  Dameisha Tschida next dose is:    ??     ?? Take 1 Tab by mouth daily.  ?? 500 mg  ??   cefdinir 300 mg capsule  Commonly known as:  OMNICEF  ??  Serrita Lueth last dose was:    ??  Gaelan Glennon next dose is:    ??     ?? Take 1 Cap by mouth two (2) times a day.  ?? 300 mg  ??                Changed Medications at Discharge:   * albuterol 1.25 mg/3 mL Nebu  Commonly known as:  ACCUNEB  What changed:  Another medication with the same name was changed. Make sure you understand how and when to take each.  ??  Ottilia Pippenger last dose was:    ??  Tollie Canada next dose is:    ??     ?? 3 mL by Nebulization route every six (6) hours as needed.  ?? 1.25 mg  ??    * albuterol 90 mcg/actuation inhaler  Commonly known as:  PROVENTIL HFA, VENTOLIN HFA, PROAIR HFA  Start taking on:  09/14/2017  What changed:  See the new instructions.  ??  Cailean Heacock last dose was:    ??  Angellina Ferdinand next dose is:    ??     ?? Take 2 Puffs by inhalation every six (6) hours for 5 days, THEN 2 Puffs every four (4) hours as needed for Wheezing or Shortness of Breath for up to 30 days.  ?? ??   metFORMIN ER 750 mg tablet  Commonly known as:  GLUCOPHAGE XR  Start taking on:  09/18/2017  What changed:    ?? These instructions start on 09/18/2017. If you are unsure what to do until then, ask Willamina Grieshop doctor or other care provider.  ?? Another medication with the same name was removed. Continue taking this medication, and follow the directions you see here.  ??  Jehan Bonano last dose was:    ??  Larayne Baxley next dose is:    ??     ?? Take 1 Tab by mouth two (2) times a day.  ?? 750 mg  ??   * rosuvastatin 5 mg tablet  Commonly known as:  CRESTOR  What changed:  Another medication with the same name was added. Make sure you understand how and when to take each.  ??  Florentino Laabs last dose was:    ??  Ruqaya Strauss next dose is:    ??     ?? TAKE 1 TABLET BY MOUTH NIGHTLY  ?? ??   * rosuvastatin 5 mg tablet  Commonly known as:  CRESTOR  What changed:  You were already taking a medication with the same name, and this prescription was added. Make sure you understand how and when to take each.  ??  Jasmene Goswami last dose was:    ??  Tay Whitwell next dose is:    ??     ?? TAKE 1 TABLET BY MOUTH NIGHTLY  ?? ??     Discontinued Medications at Discharge: none    Medication reconciliation was performed with patient, who verbalizes understanding of administration of home medications.  There were no barriers to obtaining medications identified at this time.    Referral to Pharm D needed: no     Current Outpatient Medications   Medication Sig   ??? albuterol (PROVENTIL HFA, VENTOLIN HFA, PROAIR HFA) 90 mcg/actuation inhaler Take 2 Puffs by inhalation every six (6) hours for 5 days, THEN 2  Puffs every four (4) hours as needed for Wheezing or Shortness of Breath for up to 30 days.   ??? [START ON 09/18/2017] metFORMIN ER (GLUCOPHAGE XR) 750 mg tablet Take 1 Tab by mouth two (2) times a day.   ??? cefdinir (OMNICEF) 300 mg capsule Take 1 Cap by mouth two (2) times a day.   ??? azithromycin (ZITHROMAX) 500 mg tab Take 1 Tab by mouth daily.   ??? gabapentin (NEURONTIN) 100 mg capsule Take 100 mg by mouth two (2) times a day.   ??? spironolactone (ALDACTONE) 100 mg tablet TAKE 1 TABLET BY MOUTH DAILY   ??? montelukast (SINGULAIR) 10 mg tablet TAKE 1 TABLET BY MOUTH DAILY   ??? nabumetone (RELAFEN) 750 mg tablet TAKE 1 TABLET BY MOUTH TWICE DAILY AS NEEDED FOR PAIN. USE SPARINGLY   ??? cloNIDine HCl (CATAPRES) 0.3 mg tablet TAKE ONE TABLET BY MOUTH TWICE DAILY   ??? magnesium oxide (MAG-OX) 400 mg tablet TAKE 1 TABLET BY MOUTH DAILY   ??? traZODone (DESYREL) 50 mg tablet Take 1 Tab by mouth nightly as needed for Sleep.   ??? tiotropium bromide (SPIRIVA RESPIMAT) 2.5 mcg/actuation inhaler Take 1 Puff by inhalation daily.   ??? fluticasone-salmeterol (ADVAIR DISKUS) 100-50 mcg/dose diskus inhaler Take 1 Puff by inhalation two (2) times a day.   ??? losartan (COZAAR) 100 mg tablet TAKE 1 TABLET BY MOUTH DAILY FOR HYPERTENSION   ??? QUEtiapine SR (SEROQUEL XR) 150 mg sr tablet TAKE 1 TABLET BY MOUTH NIGHTLY   ??? amLODIPine (NORVASC) 10 mg tablet TAKE ONE TABLET BY MOUTH ONCE DAILY   ??? rosuvastatin (CRESTOR) 5 mg tablet TAKE 1 TABLET BY MOUTH NIGHTLY   ??? polyethylene glycol (MIRALAX) 17 gram packet Take 1 Packet by mouth daily.   ??? triamcinolone (NASACORT) 55 mcg nasal inhaler 2 Sprays by Both Nostrils route daily.   ??? albuterol (ACCUNEB) 1.25 mg/3 mL nebu 3 mL by Nebulization route every six (6) hours as needed.   ??? Blood-Glucose Meter monitoring kit Check sugars 2 times per day, fasting and 2 hours after dinner   ??? Lancets misc Check sugars twice daily   ??? glucose blood VI test strips (ASCENSIA AUTODISC VI, ONE TOUCH ULTRA TEST  VI) strip Check sugars twice daily     No current facility-administered medications for this visit.        Medications Discontinued During This Encounter   Medication Reason   ??? benzonatate (TESSALON PERLES) 100 mg capsule Not A Current Medication   ??? rosuvastatin (CRESTOR) 5 mg tablet Duplicate Order       [facility] follow up appointment(s):   Future Appointments   Date Time Provider [place]   09/20/2017  2:30 PM Wallene Huh, PA-C [place]     ???  Attends follow-up appointments as directed.     ??? Prevent complications post hospitalization.      Patient will attend all doctors appointments as scheduled  Nurse Navigator will contact patient weekly for at least 4 weeks after discharge      ??? Understands red flags post discharge.      ??? Confusion  ??? Fast breathing  ??? Chills or shaking  ??? Fever or low body temperature  ??? Fast heart beat  ??? Lightheadedness  ??? Less urine output  ??? Skin rash or skin color changes    09/15/2017 mailed sepsis letter to patient

## 2017-09-19 ENCOUNTER — Encounter: Attending: Physician Assistant | Primary: Physician Assistant

## 2017-09-20 ENCOUNTER — Ambulatory Visit: Attending: Physician Assistant | Primary: Physician Assistant

## 2017-09-20 ENCOUNTER — Ambulatory Visit
Admit: 2017-09-20 | Discharge: 2017-09-20 | Payer: MEDICARE | Attending: Physician Assistant | Primary: Physician Assistant

## 2017-09-20 ENCOUNTER — Inpatient Hospital Stay: Admit: 2017-09-20 | Payer: MEDICARE | Primary: Physician Assistant

## 2017-09-20 DIAGNOSIS — A419 Sepsis, unspecified organism: Secondary | ICD-10-CM

## 2017-09-20 DIAGNOSIS — R6521 Severe sepsis with septic shock: Secondary | ICD-10-CM

## 2017-09-20 MED ORDER — TIOTROPIUM BROMIDE 2.5 MCG/ACTUATION MIST FOR INHALATION
2.5 mcg/actuation | Freq: Every day | RESPIRATORY_TRACT | 0 refills | Status: DC
Start: 2017-09-20 — End: 2017-10-11

## 2017-09-20 MED ORDER — FLUTICASONE-SALMETEROL 100 MCG-50 MCG/DOSE DISK DEVICE FOR INHALATION
100-50 mcg/dose | Freq: Two times a day (BID) | RESPIRATORY_TRACT | 2 refills | Status: DC
Start: 2017-09-20 — End: 2017-10-11

## 2017-09-20 MED ORDER — IPRATROPIUM-ALBUTEROL 2.5 MG-0.5 MG/3 ML NEB SOLUTION
2.5 mg-0.5 mg/3 ml | INHALATION_SOLUTION | RESPIRATORY_TRACT | 0 refills | Status: AC
Start: 2017-09-20 — End: 2017-09-20

## 2017-09-20 NOTE — Progress Notes (Signed)
Transition of Care/Hospital Follow up   [facility]      Date of Service:  09/21/2017   Patient's Name: [name] [name]   Patient's DOB:  25-Nov-1950     Subjective Hx:       Chief Complaint   Patient presents with   ??? Cough   ??? Follow-up     ER visit    ??? Hip Pain        [name] [name] is a 67 y.o. female with a history of  has a past medical history of Arthritis, Chronic pain syndrome, COPD (chronic obstructive pulmonary disease) ([place]), DM2 (diabetes mellitus, type 2) ([place]), GERD (gastroesophageal reflux disease), Hepatitis C, HTN (hypertension), Lung nodule, multiple, Marijuana use, PTSD (post-traumatic stress disorder), Thoracic ascending aortic aneurysm ([place]), and Thyroid nodule.who was seen today for follow up after hospitalization.  Admitted to [facility] on 09/08/2017 and discharged 09/14/2017, primary diagnosis of sepsis due to UTI and Pneumonia.     CareEverywhere updated. Hospital encounter notes, testing and discharge summary reviewed.     Since discharge she has been feeling tired, but getting better slowly. [place] nurse came today to see her.   She is still coughing and feels SOB. She denies fever. She is only using the ventolin inhaler- discharge summary shows she should be taking Advair and Spiriva. She does not have an appt scheduled with Dr. Lavone Neri, Pulm, but says his office has tried to reach out to her.   She will need repeat UA, BMP today. Should have repeat CXR in 1 week    PMHx and Problem list were reviewed and annotated with pertinent information.  Medication list updated as below.       Past Medical History:   Diagnosis Date   ??? Arthritis    ??? 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 COPD (chronic obstructive pulmonary disease) ([place])    ??? DM2 (diabetes mellitus, type 2) ([place])    ??? GERD (gastroesophageal reflux disease)    ??? Hepatitis C     HEP C   ??? HTN (hypertension)    ??? Lung nodule, multiple     (CT 10/2016) Small left upper lobe and 1.7 x 1.6 cm left lower lobe  nodules not significantly changed from 07/23/2016 and 03/22/2016   ??? Marijuana use    ??? PTSD (post-traumatic stress disorder)     lived through the [place] in 1993   ??? Thoracic ascending aortic aneurysm ([place])     4.4cm noted on CT Chest (10/2016)   ??? Thyroid nodule     2.5 cm stable right thyroid nodule (CT 10/2016)         Active Problem List:      Patient Active Problem List   Diagnosis Code   ??? Severe hypertension I10   ??? 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 PTSD (post-traumatic stress disorder) F43.10   ??? Severe headache R51   ??? Accelerated hypertension I10   ??? Acute colitis K52.9   ??? Acute hyperglycemia R73.9   ??? Type 2 diabetes mellitus with hyperglycemia, without long-term current use of insulin (HCC) E11.65   ??? 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 Lumbar and sacral osteoarthritis M47.817   ??? Chronic pain syndrome G89.4   ??? Essential hypertension I10   ??? Sacroiliitis (HCC) M46.1   ??? 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  Lactic acidosis E87.2   ??? Leukocytosis D72.829   ??? Sepsis (HCC) A41.9   ??? Asthma exacerbation J45.901   ??? Asthma with acute exacerbation J45.901   ??? Gastritis K29.70   ??? Nausea & vomiting R11.2   ??? 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 Cannabinoid hyperemesis syndrome (HCC) F12.988   ??? 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 Chest pain R07.9   ??? Dyspnea R06.00   ??? Dehydration E86.0   ??? COPD with acute exacerbation ([place]) J44.1   ??? Acute-on-chronic kidney injury ([place]) N17.9, N18.9   ??? Hyperkalemia E87.5   ??? Obtunded R40.1   ??? Acute renal failure (ARF) (HCC) N17.9   ??? Septic shock (HCC) A41.9, Z61.09   ??? Metabolic encephalopathy U04.54       Medications:     Current Outpatient Medications   Medication Sig   ??? tiotropium bromide (SPIRIVA RESPIMAT) 2.5 mcg/actuation inhaler Take 1 Puff by inhalation daily.   ??? fluticasone propion-salmeterol (ADVAIR) 100-50 mcg/dose diskus inhaler  Take 1 Puff by inhalation every twelve (12) hours.   ??? albuterol (PROVENTIL HFA, VENTOLIN HFA, PROAIR HFA) 90 mcg/actuation inhaler Take 2 Puffs by inhalation every six (6) hours for 5 days, THEN 2 Puffs every four (4) hours as needed for Wheezing or Shortness of Breath for up to 30 days.   ??? metFORMIN ER (GLUCOPHAGE XR) 750 mg tablet Take 1 Tab by mouth two (2) times a day.   ??? cefdinir (OMNICEF) 300 mg capsule Take 1 Cap by mouth two (2) times a day.   ??? azithromycin (ZITHROMAX) 500 mg tab Take 1 Tab by mouth daily.   ??? gabapentin (NEURONTIN) 100 mg capsule Take 100 mg by mouth two (2) times a day.   ??? spironolactone (ALDACTONE) 100 mg tablet TAKE 1 TABLET BY MOUTH DAILY   ??? montelukast (SINGULAIR) 10 mg tablet TAKE 1 TABLET BY MOUTH DAILY   ??? nabumetone (RELAFEN) 750 mg tablet TAKE 1 TABLET BY MOUTH TWICE DAILY AS NEEDED FOR PAIN. USE SPARINGLY   ??? cloNIDine HCl (CATAPRES) 0.3 mg tablet TAKE ONE TABLET BY MOUTH TWICE DAILY   ??? magnesium oxide (MAG-OX) 400 mg tablet TAKE 1 TABLET BY MOUTH DAILY   ??? traZODone (DESYREL) 50 mg tablet Take 1 Tab by mouth nightly as needed for Sleep.   ??? losartan (COZAAR) 100 mg tablet TAKE 1 TABLET BY MOUTH DAILY FOR HYPERTENSION   ??? QUEtiapine SR (SEROQUEL XR) 150 mg sr tablet TAKE 1 TABLET BY MOUTH NIGHTLY   ??? amLODIPine (NORVASC) 10 mg tablet TAKE ONE TABLET BY MOUTH ONCE DAILY   ??? rosuvastatin (CRESTOR) 5 mg tablet TAKE 1 TABLET BY MOUTH NIGHTLY   ??? albuterol (ACCUNEB) 1.25 mg/3 mL nebu 3 mL by Nebulization route every six (6) hours as needed.   ??? polyethylene glycol (MIRALAX) 17 gram packet Take 1 Packet by mouth daily.   ??? Blood-Glucose Meter monitoring kit Check sugars 2 times per day, fasting and 2 hours after dinner   ??? Lancets misc Check sugars twice daily   ??? glucose blood VI test strips (ASCENSIA AUTODISC VI, ONE TOUCH ULTRA TEST VI) strip Check sugars twice daily   ??? fluticasone-salmeterol (ADVAIR DISKUS) 100-50 mcg/dose diskus inhaler  Take 1 Puff by inhalation two (2) times a day.   ??? triamcinolone (NASACORT) 55 mcg nasal inhaler 2 Sprays by Both Nostrils route daily.     No current facility-administered medications for this visit.  Additional History     Past Surgical History:   Procedure Laterality Date   ??? HX ENDOSCOPY     ??? HX HIP REPLACEMENT Right 01/29/2015   ??? HX HIP REPLACEMENT     ??? HX HYSTERECTOMY  2010    hysterectomy     Social History     Socioeconomic History   ??? Marital status: MARRIED     Spouse name: Not on file   ??? Number of children: Not on file   ??? Years of education: Not on file   ??? Highest education level: Not on file   Tobacco Use   ??? Smoking status: Former Smoker   ??? Smokeless tobacco: Never Used   ??? Tobacco comment: reported as quit smoking around 1990s per spouse   Substance and Sexual Activity   ??? Alcohol use: No   ??? Drug use: Yes     Types: Marijuana   ??? Sexual activity: Never     Family History   Problem Relation Age of Onset   ??? Hypertension Mother    ??? Other Mother         OSA   ??? Cancer Father         suspected leukemia per pt's spouse   ??? Cancer Maternal Aunt    ??? Alcohol abuse Maternal Grandfather      Allergies   Allergen Reactions   ??? Chocolate [Cocoa] Sneezing              Review of Systems (positives in bold)   CONST:   fatigue, malaise, unintentional weight change, fevers, chills, rigors  NEURO:  dizziness, lightheadeness, orthostasis, loss of consciousness, confusion    focal weakness, new gait difficulty/imbalance  HEENT: ear pain, ear pressure/popping, ear discharge/drainage    nosebleeds, sneezing, runny nose, nasal congestion    sore throat, dry mouth,voice change, hoarse voice,    CV:      chest pain, palpitations, chest wall pain, orthopnea, PND, edema  PULM:  SOB, wheezing, cough, sputum production, hemoptysis  GI:             nausea, vomiting, dry heaves, abdominal pain,     diarrhea, constipation  GU:       dysuria, frequency, urgency, hematuria, flank pain, decreased  urine output, dark urine color, malodorous urine  MS:      focal muscle pain, myalgias  SKIN:        rashes, itching  PSYCH: anxiety, insomnia, hallucinations, depression    Physical Assessment:   VS:    Visit Vitals  BP 117/82 (BP 1 Location: Right arm, BP Patient Position: Sitting)   Pulse 93   Temp 98.7 ??F (37.1 ??C) (Oral)   Resp 20   Ht 5' 8"  (1.727 m)   Wt 196 lb (88.9 kg)   SpO2 96%   BMI 29.80 kg/m??       General:   Pleasant female, well-nourished, well-groomed,  conversant, alert, in no acute distress but appears tired  Head:  Normocephalic, atraumatic  Cardiovasc:   Regular rate and rhythm, no murmurs, no rubs, no gallops  Pulmonary:   Decreased breath sounds at both bases. Expiratory wheezes, no rales, no respiratory distress  Abdomen:   Abdomen soft, nontender, nondistended  Extremities:   No edema, no tenderness with palpation of calves, warm and well-perfused  Neuro:   Alert, conversant, appropriate, following commands, no focal deficits.     Sitting in office wheelchair due to chronic hip pain  Skin:  No rashes noted.  Psych:  Affect and behavior are normal and appropriate    Thought process demonstrated is linear and logical    Nebulizer treatment was administered with albuterol and ipratropium sln. Pulse ox/heart rate prior to treatment 92% and 113 bpm . Pulse ox/heart rate post treatment 96% and 93 bpm. Breath sounds improved post treatment with resolution of wheezes and increased breath sounds.  Pt tolerated procedure well with no complaints. Feels better post treatment    Assessment/Plan:        [name] [name] was seen today for transition of care visit.      ICD-10-CM ICD-9-CM    1. Septic shock (HCC) A41.9 038.9 URINALYSIS W/MICROSCOPIC    U27.25 366.44 METABOLIC PANEL, BASIC     995.92 CANCELED: CBC WITH AUTOMATED DIFF   2. Acute cystitis without hematuria N30.00 595.0 URINALYSIS W/MICROSCOPIC      METABOLIC PANEL, BASIC      CANCELED: CBC WITH AUTOMATED DIFF    3. Pneumonia of right lower lobe due to infectious organism ([place]) I34.7 425 METABOLIC PANEL, BASIC      XR CHEST PA LAT      REFERRAL TO PULMONARY DISEASE      CANCELED: CBC WITH AUTOMATED DIFF   4. COPD with acute exacerbation (HCC) J44.1 491.21 albuterol-ipratropium (DUO-NEB) 2.5 mg-0.5 mg/3 ml nebu      METABOLIC PANEL, BASIC      XR CHEST PA LAT      tiotropium bromide (SPIRIVA RESPIMAT) 2.5 mcg/actuation inhaler      fluticasone propion-salmeterol (ADVAIR) 100-50 mcg/dose diskus inhaler      REFERRAL TO PULMONARY DISEASE      CANCELED: CBC WITH AUTOMATED DIFF   5. Hyperkalemia Z56.3 875.6 METABOLIC PANEL, BASIC      CANCELED: CBC WITH AUTOMATED DIFF   6. Acute renal failure, unspecified acute renal failure type ([place]) N17.9 584.9 URINALYSIS W/MICROSCOPIC      METABOLIC PANEL, BASIC       Hospitalization for septic shock 2/2 Pneumonia and UTI. She finished all abx prescribed at discharge. Will send urine out for culture today.   Return in one week for repeat CXR.   COPD- we reviewed her medication and she is not taking Advair or Spiriva. I have sent these prescriptions to her pharmacy. I am also referring her to Pulmonology.   Recking kidney function and potassium level with repeat BMP today.   The patient states understanding and agrees with the treatment plan. All questions were answered.    Follow-up and Dispositions    ?? Return in about 1 week (around 09/27/2017) for CXR.       Wallene Huh, PA-C  09/21/2017, 3:06 PM

## 2017-09-20 NOTE — Progress Notes (Signed)
[name] [name] is a 67 y.o. female  Here for follow up Er Hyperkalemia. Shortness of breath, cough. Congestion. Having right hip pain hx of hip replacement.  No fever noted. Had visiting nurse come today.

## 2017-09-20 NOTE — Patient Instructions (Signed)
Chronic Obstructive Pulmonary Disease (COPD): Care Instructions  Your Care Instructions    Chronic obstructive pulmonary disease (COPD) is a general term for a group of lung diseases, including emphysema and chronic bronchitis. People with COPD have decreased airflow in and out of the lungs, which makes it hard to breathe. The airways also can get clogged with thick mucus. Cigarette smoking is a major cause of COPD.  Although there is no cure for COPD, you can slow its progress. Following your treatment plan and taking care of yourself can help you feel better and live longer.  Follow-up care is a key part of your treatment and safety. Be sure to make and go to all appointments, and call your doctor if you are having problems. It's also a good idea to know your test results and keep a list of the medicines you take.  How can you care for yourself at home?  ??Staying healthy  ?? ?? Do not smoke. This is the most important step you can take to prevent more damage to your lungs. If you need help quitting, talk to your doctor about stop-smoking programs and medicines. These can increase your chances of quitting for good.   ?? ?? Avoid colds and flu. Get a pneumococcal vaccine shot. If you have had one before, ask your doctor whether you need a second dose. Get the flu vaccine every fall. If you must be around people with colds or the flu, wash your hands often.   ?? ?? Avoid secondhand smoke, air pollution, and high altitudes. Also avoid cold, dry air and hot, humid air. Stay at home with your windows closed when air pollution is bad.   ??Medicines and oxygen therapy  ?? ?? Take your medicines exactly as prescribed. Call your doctor if you think you are having a problem with your medicine.   ?? ?? You may be taking medicines such as:  ? Bronchodilators. These help open your airways and make breathing easier. Bronchodilators are either short-acting (work for 6 to 9 hours) or  long-acting (work for 24 hours). You inhale most bronchodilators, so they start to act quickly. Always carry your quick-relief inhaler with you in case you need it while you are away from home.  ? Corticosteroids (prednisone, budesonide). These reduce airway inflammation. They come in pill or inhaled form. You must take these medicines every day for them to work well.   ?? ?? A spacer may help you get more inhaled medicine to your lungs. Ask your doctor or pharmacist if a spacer is right for you. If it is, ask how to use it properly.   ?? ?? Do not take any vitamins, over-the-counter medicine, or herbal products without talking to your doctor first.   ?? ?? If your doctor prescribed antibiotics, take them as directed. Do not stop taking them just because you feel better. You need to take the full course of antibiotics.   ?? ?? Oxygen therapy boosts the amount of oxygen in your blood and helps you breathe easier. Use the flow rate your doctor has recommended, and do not change it without talking to your doctor first.   Activity  ?? ?? Get regular exercise. Walking is an easy way to get exercise. Start out slowly, and walk a little more each day.   ?? ?? Pay attention to your breathing. You are exercising too hard if you cannot talk while you are exercising.   ?? ?? Take short rest breaks when doing household chores   and other activities.   ?? ?? Learn breathing methods???such as breathing through pursed lips???to help you become less short of breath.   ?? ?? If your doctor has not set you up with a pulmonary rehabilitation program, talk to him or her about whether rehab is right for you. Rehab includes exercise programs, education about your disease and how to manage it, help with diet and other changes, and emotional support.   Diet  ?? ?? Eat regular, healthy meals. Use bronchodilators about 1 hour before you eat to make it easier to eat. Eat several small meals instead of three  large ones. Drink beverages at the end of the meal. Avoid foods that are hard to chew.   ?? ?? Eat foods that contain protein so that you do not lose muscle mass.   ?? ?? Talk with your doctor if you gain too much weight or if you lose weight without trying.   ??Mental health  ?? ?? Talk to your family, friends, or a therapist about your feelings. It is normal to feel frightened, angry, hopeless, helpless, and even guilty. Talking openly about bad feelings can help you cope. If these feelings last, talk to your doctor.   When should you call for help?  Call 911 anytime you think you may need emergency care. For example, call if:  ?? ?? You have severe trouble breathing.   ??Call your doctor now or seek immediate medical care if:  ?? ?? You have new or worse trouble breathing.   ?? ?? You cough up blood.   ?? ?? You have a fever.   ??Watch closely for changes in your health, and be sure to contact your doctor if:  ?? ?? You cough more deeply or more often, especially if you notice more mucus or a change in the color of your mucus.   ?? ?? You have new or worse swelling in your legs or belly.   ?? ?? You are not getting better as expected.   Where can you learn more?  Go to http://www.healthwise.net/[facility].  Enter X915 in the search box to learn more about "Chronic Obstructive Pulmonary Disease (COPD): Care Instructions."  Current as of: February 15, 2017  Content Version: 11.9  ?? 2006-2018 [employer]. Care instructions adapted under license by [employer] (which disclaims liability or warranty for this information). If you have questions about a medical condition or this instruction, always ask your healthcare professional. [employer] disclaims any warranty or liability for your use of this information.

## 2017-09-21 ENCOUNTER — Institutional Professional Consult (permissible substitution)
Admit: 2017-09-21 | Discharge: 2017-09-21 | Payer: MEDICARE | Attending: Physician Assistant | Primary: Physician Assistant

## 2017-09-21 DIAGNOSIS — J189 Pneumonia, unspecified organism: Secondary | ICD-10-CM

## 2017-09-21 DIAGNOSIS — J181 Lobar pneumonia, unspecified organism: Secondary | ICD-10-CM

## 2017-09-21 LAB — METABOLIC PANEL, BASIC
Anion gap: 14 mmol/L (ref 3.0–18)
BUN/Creatinine ratio: 10 — ABNORMAL LOW (ref 12–20)
BUN: 9 MG/DL (ref 7.0–18)
CO2: 20 mmol/L — ABNORMAL LOW (ref 21–32)
Calcium: 9.2 MG/DL (ref 8.5–10.1)
Chloride: 104 mmol/L (ref 100–108)
Creatinine: 0.91 MG/DL (ref 0.6–1.3)
GFR est AA: >60 mL/min/{1.73_m2} (ref >60)
GFR est non-AA: >60 mL/min/{1.73_m2} (ref >60)
Glucose: 104 mg/dL — ABNORMAL HIGH (ref 74–99)
Potassium: 4.5 mmol/L (ref 3.5–5.5)
Sodium: 138 mmol/L (ref 136–145)

## 2017-09-21 LAB — URINALYSIS W/MICROSCOPIC
Bacteria: NEGATIVE /hpf
Bilirubin: NEGATIVE
Blood: NEGATIVE
Glucose: NEGATIVE mg/dL
Ketone: NEGATIVE mg/dL
Nitrites: NEGATIVE
Protein: NEGATIVE mg/dL
RBC: 0 /hpf (ref 0–5)
Specific gravity: 1.007 (ref 1.005–1.030)
Urobilinogen: 0.2 EU/dL (ref 0.2–1.0)
WBC: 2 /hpf (ref 0–4)
pH (UA): 6 (ref 5.0–8.0)

## 2017-09-21 NOTE — Progress Notes (Signed)
UA and kidney function much better

## 2017-09-21 NOTE — Progress Notes (Signed)
[name] ShutterMonica R Schiano is a 67 y.o. female here for labs only, tolerated well, one stick

## 2017-09-21 NOTE — Progress Notes (Signed)
Patient aware of Moniques note.

## 2017-09-22 ENCOUNTER — Inpatient Hospital Stay: Admit: 2017-09-22 | Payer: MEDICARE | Primary: Physician Assistant

## 2017-09-22 LAB — CBC WITH AUTOMATED DIFF
ABS. BASOPHILS: 0 10*3/uL (ref 0.0–0.1)
ABS. EOSINOPHILS: 0.1 10*3/uL (ref 0.0–0.4)
ABS. LYMPHOCYTES: 3.4 10*3/uL (ref 0.9–3.6)
ABS. MONOCYTES: 1.1 10*3/uL (ref 0.05–1.2)
ABS. NEUTROPHILS: 11.5 10*3/uL — ABNORMAL HIGH (ref 1.8–8.0)
BASOPHILS: 0 % (ref 0–2)
EOSINOPHILS: 1 % (ref 0–5)
HCT: 34.5 % — ABNORMAL LOW (ref 35.0–45.0)
HGB: 10.7 g/dL — ABNORMAL LOW (ref 12.0–16.0)
LYMPHOCYTES: 21 % (ref 21–52)
MCH: 28.5 PG (ref 24.0–34.0)
MCHC: 31 g/dL (ref 31.0–37.0)
MCV: 92 FL (ref 74.0–97.0)
MONOCYTES: 7 % (ref 3–10)
MPV: 10.5 FL (ref 9.2–11.8)
NEUTROPHILS: 71 % (ref 40–73)
PLATELET: 340 10*3/uL (ref 135–420)
RBC: 3.75 M/uL — ABNORMAL LOW (ref 4.20–5.30)
RDW: 14.6 % — ABNORMAL HIGH (ref 11.6–14.5)
WBC: 16.2 10*3/uL — ABNORMAL HIGH (ref 4.6–13.2)

## 2017-09-22 NOTE — Progress Notes (Signed)
Patient aware Of Moniques note on WBC's She stated she is not having any urinary symptoms, no fever, does not feel warm, not having chills. Her cough is not worsening but still the same as when she was seen in office. Only sx she is having is Fatigue.

## 2017-09-22 NOTE — Progress Notes (Signed)
WBCs are going back up. Does she have fever, chills, worsening cough, UTI symptoms?

## 2017-09-28 NOTE — Progress Notes (Signed)
Hospital Discharge Follow-Up      Date/Time:  09/28/2017 11:09 AM      Top Challenges reviewed with the provider   Pain - chronic hip pain right side above hip rated 6/10 right now worst 8/10 and least 4/10.    SOB - reports being mildly SOB.  Was able to carry on a conversation but did become a bit breathy with the effort.     Method of communication with provider :chart routing    Inpatient RRAT score: High Risk            34       Total Score        3 Has Seen PCP in Last 6 Months (Yes=3, No=0)    2 Married. Living with Significant Other. Assisted Living. LTAC. SNF. or   Rehab    4 IP Visits Last 12 Months (1-3=4, 4=9, >4=11)    25 Charlson Comorbidity Score (Age + Comorbid Conditions)        Criteria that do not apply:    Patient Length of Stay (>5 days = 3)    Pt. Coverage (Medicare=5 , Medicaid, or Self-Pay=4)      Was this a readmission? yes   Patient stated reason for the readmission: unresponsive at home    Nurse Navigator (NN) contacted the patient by telephone to perform post hospital discharge assessment. Verified name and DOB with patient as identifiers. Provided introduction to self, and explanation of the Nurse Navigator role.    Patient given an opportunity to ask questions and does not have any further questions or concerns at this time. The patient agrees to contact the PCP office for questions related to their healthcare. NN provided contact information for future reference.    Disease Specific:   N/A    Summary of patient's top problems:  Medication(s):   No changes since last assessment     Current Outpatient Medications   Medication Sig   ??? tiotropium bromide (SPIRIVA RESPIMAT) 2.5 mcg/actuation inhaler Take 1 Puff by inhalation daily.   ??? fluticasone propion-salmeterol (ADVAIR) 100-50 mcg/dose diskus inhaler Take 1 Puff by inhalation every twelve (12) hours.   ??? albuterol (PROVENTIL HFA, VENTOLIN HFA, PROAIR HFA) 90 mcg/actuation  inhaler Take 2 Puffs by inhalation every six (6) hours for 5 days, THEN 2 Puffs every four (4) hours as needed for Wheezing or Shortness of Breath for up to 30 days.   ??? metFORMIN ER (GLUCOPHAGE XR) 750 mg tablet Take 1 Tab by mouth two (2) times a day.   ??? cefdinir (OMNICEF) 300 mg capsule Take 1 Cap by mouth two (2) times a day.   ??? azithromycin (ZITHROMAX) 500 mg tab Take 1 Tab by mouth daily.   ??? gabapentin (NEURONTIN) 100 mg capsule Take 100 mg by mouth two (2) times a day.   ??? spironolactone (ALDACTONE) 100 mg tablet TAKE 1 TABLET BY MOUTH DAILY   ??? montelukast (SINGULAIR) 10 mg tablet TAKE 1 TABLET BY MOUTH DAILY   ??? nabumetone (RELAFEN) 750 mg tablet TAKE 1 TABLET BY MOUTH TWICE DAILY AS NEEDED FOR PAIN. USE SPARINGLY   ??? cloNIDine HCl (CATAPRES) 0.3 mg tablet TAKE ONE TABLET BY MOUTH TWICE DAILY   ??? magnesium oxide (MAG-OX) 400 mg tablet TAKE 1 TABLET BY MOUTH DAILY   ??? traZODone (DESYREL) 50 mg tablet Take 1 Tab by mouth nightly as needed for Sleep.   ??? fluticasone-salmeterol (ADVAIR DISKUS) 100-50 mcg/dose diskus inhaler Take 1 Puff by inhalation two (  2) times a day.   ??? losartan (COZAAR) 100 mg tablet TAKE 1 TABLET BY MOUTH DAILY FOR HYPERTENSION   ??? QUEtiapine SR (SEROQUEL XR) 150 mg sr tablet TAKE 1 TABLET BY MOUTH NIGHTLY   ??? amLODIPine (NORVASC) 10 mg tablet TAKE ONE TABLET BY MOUTH ONCE DAILY   ??? rosuvastatin (CRESTOR) 5 mg tablet TAKE 1 TABLET BY MOUTH NIGHTLY   ??? albuterol (ACCUNEB) 1.25 mg/3 mL nebu 3 mL by Nebulization route every six (6) hours as needed.   ??? polyethylene glycol (MIRALAX) 17 gram packet Take 1 Packet by mouth daily.   ??? triamcinolone (NASACORT) 55 mcg nasal inhaler 2 Sprays by Both Nostrils route daily.   ??? Blood-Glucose Meter monitoring kit Check sugars 2 times per day, fasting and 2 hours after dinner   ??? Lancets misc Check sugars twice daily   ??? glucose blood VI test strips (ASCENSIA AUTODISC VI, ONE TOUCH ULTRA TEST VI) strip Check sugars twice daily      No current facility-administered medications for this visit.        There are no discontinued medications.    [facility] follow up appointment(s):   Future Appointments   Date Time Provider [place]   10/04/2017  2:30 PM Wallene Huh, PA-C [facility]        Goals     ??? Attends follow-up appointments as directed.     ??? Prevent complications post hospitalization.      Patient will attend all doctors appointments as scheduled  Nurse Navigator will contact patient weekly for at least 4 weeks after discharge      ??? Understands red flags post discharge.      ??? Confusion  ??? Fast breathing  ??? Chills or shaking  ??? Fever or low body temperature  ??? Fast heart beat  ??? Lightheadedness  ??? Less urine output  ??? Skin rash or skin color changes    09/15/2017 mailed sepsis letter to patient

## 2017-10-04 ENCOUNTER — Encounter
Admit: 2017-10-04 | Discharge: 2017-10-04 | Payer: MEDICARE | Attending: Physician Assistant | Primary: Physician Assistant

## 2017-10-04 ENCOUNTER — Encounter: Admit: 2017-10-04 | Primary: Physician Assistant

## 2017-10-04 DIAGNOSIS — J189 Pneumonia, unspecified organism: Secondary | ICD-10-CM

## 2017-10-04 NOTE — Progress Notes (Signed)
Looks like pneumonia resolved. Nodules seen in the lungs. Did she make an appt with the Pulmonologist yet?

## 2017-10-06 NOTE — Telephone Encounter (Signed)
[name] [name], the husband of [name] [name] came into the office for an appointment and requested the recent results of his wife's chest x-ray. I confirmed that he was able to receive this information and he provided two patient identifiers. Informed patient's husband that the pneumonia looks resolved and that nodules were seen in the lungs. Husband stated that they did have an appointment soon with the pulmonologist. Husband had no other questions or concerns at this time.     Percell BeltPayton Everette Dimauro, LPN

## 2017-10-08 ENCOUNTER — Observation Stay

## 2017-10-08 ENCOUNTER — Emergency Department: Admit: 2017-10-09 | Payer: MEDICARE | Primary: Physician Assistant

## 2017-10-08 DIAGNOSIS — J441 Chronic obstructive pulmonary disease with (acute) exacerbation: Secondary | ICD-10-CM
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
## 2017-10-08 NOTE — ED Triage Notes (Signed)
Pt came in from home, pt left the hospital a couple days ago was here for the same thing. Pt is having a hard time breathing.  Pt has hx of copd and asthma.     Pt is aaox4

## 2017-10-09 ENCOUNTER — Emergency Department: Admit: 2017-10-09 | Payer: MEDICARE | Primary: Physician Assistant

## 2017-10-09 ENCOUNTER — Inpatient Hospital Stay
Admit: 2017-10-09 | Discharge: 2017-10-09 | Disposition: A | Discharge status: Home / Self Care | Payer: MEDICARE | Attending: Emergency Medicine

## 2017-10-09 LAB — EKG 12-LEAD
Atrial Rate: 110 {beats}/min
P Axis: 86 degrees
P-R Interval: 148 ms
Q-T Interval: 314 ms
QRS Duration: 64 ms
QTc Calculation (Bazett): 424 ms
R Axis: 54 degrees
T Axis: 57 degrees
Ventricular Rate: 110 {beats}/min

## 2017-10-09 LAB — CBC WITH AUTOMATED DIFF
BASOPHILS: 0.3 % (ref 0–3)
EOSINOPHILS: 0.1 % (ref 0–5)
HCT: 37.3 % (ref 37.0–50.0)
HGB: 12.4 gm/dl — ABNORMAL LOW (ref 13.0–17.2)
IMMATURE GRANULOCYTES: 0.5 % (ref 0.0–3.0)
LYMPHOCYTES: 13.5 % — ABNORMAL LOW (ref 28–48)
MCH: 29.2 pg (ref 25.4–34.6)
MCHC: 33.2 gm/dl (ref 30.0–36.0)
MCV: 87.8 fL (ref 80.0–98.0)
MONOCYTES: 4.8 % (ref 1–13)
MPV: 11 fL — ABNORMAL HIGH (ref 6.0–10.0)
NEUTROPHILS: 80.8 % — ABNORMAL HIGH (ref 34–64)
NRBC: 0 (ref 0–0)
PLATELET: 235 10*3/uL (ref 140–450)
RBC: 4.25 M/uL (ref 3.60–5.20)
RDW-SD: 44.3 (ref 36.4–46.3)
WBC: 11.1 10*3/uL — ABNORMAL HIGH (ref 4.0–11.0)

## 2017-10-09 LAB — CBC W/O DIFF
HCT: 36.3 % — ABNORMAL LOW (ref 37.0–50.0)
HGB: 12.2 gm/dl — ABNORMAL LOW (ref 13.0–17.2)
MCH: 29.6 pg (ref 25.4–34.6)
MCHC: 33.6 gm/dl (ref 30.0–36.0)
MCV: 88.1 fL (ref 80.0–98.0)
MPV: 11.1 fL — ABNORMAL HIGH (ref 6.0–10.0)
PLATELET: 257 10*3/uL (ref 140–450)
RBC: 4.12 M/uL (ref 3.60–5.20)
RDW-SD: 44 (ref 36.4–46.3)
WBC: 9.1 10*3/uL (ref 4.0–11.0)

## 2017-10-09 LAB — BLOOD GAS, ARTERIAL
ALLENS TEST: POSITIVE
BASE EXCESS: -3 mmol/L — ABNORMAL LOW (ref <=3)
BICARBONATE: 21.5 mmol/L (ref 18.0–26.0)
CO2, TOTAL: 22.6 mmol/L — ABNORMAL LOW (ref 24–29)
FIO2: 21
O2 SAT: 93.9 % (ref 90–100)
PCO2: 33.9 mm Hg — ABNORMAL LOW (ref 35.0–45.0)
PO2: 68.2 mm Hg — ABNORMAL LOW (ref 75–100)
Patient temp.: 98.3
Performed by: 99351
pH: 7.41 (ref 7.350–7.450)

## 2017-10-09 LAB — METABOLIC PANEL, COMPREHENSIVE
ALT (SGPT): 19 U/L (ref 12–78)
AST (SGOT): 8 U/L — ABNORMAL LOW (ref 15–37)
Albumin: 4.2 gm/dl (ref 3.4–5.0)
Alk. phosphatase: 82 U/L (ref 45–117)
Anion gap: 10 mmol/L (ref 5–15)
BUN: 20 mg/dl (ref 7–25)
Bilirubin, total: 0.5 mg/dl (ref 0.2–1.0)
CO2: 24 mEq/L (ref 21–32)
Calcium: 10 mg/dl (ref 8.5–10.1)
Chloride: 101 mEq/L (ref 98–107)
Creatinine: 1.3 mg/dl (ref 0.6–1.3)
GFR est AA: 53
GFR est non-AA: 44
Glucose: 184 mg/dl — ABNORMAL HIGH (ref 74–106)
Potassium: 4.5 mEq/L (ref 3.5–5.1)
Protein, total: 9 gm/dl — ABNORMAL HIGH (ref 6.4–8.2)
Sodium: 134 mEq/L — ABNORMAL LOW (ref 136–145)

## 2017-10-09 LAB — METABOLIC PANEL, BASIC
Anion gap: 11 mmol/L (ref 5–15)
BUN: 17 mg/dl (ref 7–25)
CO2: 22 mEq/L (ref 21–32)
Calcium: 9.3 mg/dl (ref 8.5–10.1)
Chloride: 104 mEq/L (ref 98–107)
Creatinine: 1.3 mg/dl (ref 0.6–1.3)
GFR est AA: 53
GFR est non-AA: 44
Glucose: 296 mg/dl — ABNORMAL HIGH (ref 74–106)
Potassium: 4.2 mEq/L (ref 3.5–5.1)
Sodium: 136 mEq/L (ref 136–145)

## 2017-10-09 LAB — LACTIC ACID
Lactic Acid: 1.5 mmol/L (ref 0.4–2.0)
Lactic Acid: 1.3 mmol/L (ref 0.4–2.0)
Lactic Acid: 2 mmol/L (ref 0.4–2.0)
Lactic Acid: 1.6 mmol/L (ref 0.4–2.0)

## 2017-10-09 LAB — GLUCOSE, POC
Glucose (POC): 288 mg/dL — ABNORMAL HIGH (ref 65–105)
Glucose (POC): 255 mg/dL — ABNORMAL HIGH (ref 65–105)
Glucose (POC): 228 mg/dL — ABNORMAL HIGH (ref 65–105)
Glucose (POC): 209 mg/dL — ABNORMAL HIGH (ref 65–105)

## 2017-10-09 LAB — EKG, 12 LEAD, INITIAL
Atrial Rate: 110 {beats}/min
Calculated P Axis: 86 degrees
Calculated R Axis: 54 degrees
Calculated T Axis: 57 degrees
P-R Interval: 148 ms
Q-T Interval: 314 ms
QRS Duration: 64 ms
QTC Calculation (Bezet): 424 ms
Ventricular Rate: 110 {beats}/min

## 2017-10-09 LAB — URINALYSIS W/ RFLX MICROSCOPIC
Bilirubin: NEGATIVE
Blood: NEGATIVE
Glucose: NEGATIVE mg/dl
Ketone: 15 mg/dl — AB
Leukocyte Esterase: NEGATIVE
Nitrites: NEGATIVE
Protein: NEGATIVE mg/dl
Specific gravity: <=1.005 (ref 1.005–1.030)
Urobilinogen: 0.2 mg/dl (ref 0.0–1.0)
pH (UA): 6.5 (ref 5.0–9.0)

## 2017-10-09 LAB — TROPONIN I: Troponin-I: <0.015 ng/ml (ref 0.000–0.045)

## 2017-10-09 LAB — NT-PRO BNP: NT pro-BNP: 54 pg/ml (ref 0.0–125.0)

## 2017-10-09 LAB — LIPASE: Lipase: 151 U/L (ref 73–393)

## 2017-10-09 LAB — PROCALCITONIN: PROCALCITONIN: <0.05 ng/ml (ref 0.00–0.50)

## 2017-10-09 MED ORDER — PANTOPRAZOLE 40 MG TAB, DELAYED RELEASE
40 mg | Freq: Every day | ORAL | Status: DC
Start: 2017-10-09 — End: 2017-10-11
  Administered 2017-10-10 – 2017-10-11 (×3): via ORAL

## 2017-10-09 MED ORDER — FLUTICASONE 200 MCG-VILANTEROL 25 MCG/DOSE BREATH ACTIVATED INHALER
200-25 mcg/dose | Freq: Every day | RESPIRATORY_TRACT | Status: DC
Start: 2017-10-09 — End: 2017-10-11
  Administered 2017-10-09 – 2017-10-11 (×3): via RESPIRATORY_TRACT

## 2017-10-09 MED ORDER — DOCUSATE SODIUM 100 MG CAP
100 mg | Freq: Two times a day (BID) | ORAL | Status: DC | PRN
Start: 2017-10-09 — End: 2017-10-11

## 2017-10-09 MED ORDER — SODIUM CHLORIDE 0.9 % IV PIGGY BACK
4.5 gram | Freq: Once | INTRAVENOUS | Status: AC
Start: 2017-10-09 — End: 2017-10-09
  Administered 2017-10-09: 04:00:00 via INTRAVENOUS

## 2017-10-09 MED ORDER — ALBUTEROL SULFATE 0.083 % (0.83 MG/ML) SOLN FOR INHALATION
2.5 mg /3 mL (0.083 %) | RESPIRATORY_TRACT | Status: AC
Start: 2017-10-09 — End: 2017-10-09
  Administered 2017-10-09: 04:00:00 via RESPIRATORY_TRACT

## 2017-10-09 MED ORDER — INSULIN GLARGINE 100 UNIT/ML INJECTION
100 unit/mL | Freq: Every evening | SUBCUTANEOUS | Status: DC
Start: 2017-10-09 — End: 2017-10-11
  Administered 2017-10-10 – 2017-10-11 (×2): via SUBCUTANEOUS

## 2017-10-09 MED ORDER — IOPAMIDOL 76 % IV SOLN
370 mg iodine /mL (76 %) | Freq: Once | INTRAVENOUS | Status: AC
Start: 2017-10-09 — End: 2017-10-09
  Administered 2017-10-09: 05:00:00 via INTRAVENOUS

## 2017-10-09 MED ORDER — INSULIN LISPRO 100 UNIT/ML INJECTION
100 unit/mL | Freq: Four times a day (QID) | SUBCUTANEOUS | Status: DC
Start: 2017-10-09 — End: 2017-10-11
  Administered 2017-10-09 – 2017-10-11 (×9): via SUBCUTANEOUS

## 2017-10-09 MED ORDER — MELATONIN 3 MG TAB
3 mg | Freq: Every evening | ORAL | Status: DC | PRN
Start: 2017-10-09 — End: 2017-10-11

## 2017-10-09 MED ORDER — QUETIAPINE SR 50 MG 24 HR TAB
50 mg | Freq: Every day | ORAL | Status: DC
Start: 2017-10-09 — End: 2017-10-11
  Administered 2017-10-09 – 2017-10-11 (×3): via ORAL

## 2017-10-09 MED ORDER — SODIUM CHLORIDE 0.9% BOLUS IV
0.9 % | Freq: Once | INTRAVENOUS | Status: AC
Start: 2017-10-09 — End: 2017-10-09
  Administered 2017-10-09: 04:00:00 via INTRAVENOUS

## 2017-10-09 MED ORDER — SPIRONOLACTONE 100 MG TAB
100 mg | Freq: Every day | ORAL | Status: DC
Start: 2017-10-09 — End: 2017-10-11
  Administered 2017-10-09 – 2017-10-11 (×3): via ORAL

## 2017-10-09 MED ORDER — TIOTROPIUM BROMIDE 2.5 MCG/ACTUATION MIST FOR INHALATION
2.5 mcg/actuation | Freq: Every day | RESPIRATORY_TRACT | Status: DC
Start: 2017-10-09 — End: 2017-10-11
  Administered 2017-10-09 – 2017-10-11 (×3): via RESPIRATORY_TRACT

## 2017-10-09 MED ORDER — ALBUTEROL SULFATE 0.083 % (0.83 MG/ML) SOLN FOR INHALATION
2.5 mg /3 mL (0.083 %) | RESPIRATORY_TRACT | Status: DC | PRN
Start: 2017-10-09 — End: 2017-10-11

## 2017-10-09 MED ORDER — PROMETHAZINE 25 MG TAB
25 mg | Freq: Four times a day (QID) | ORAL | Status: DC | PRN
Start: 2017-10-09 — End: 2017-10-10

## 2017-10-09 MED ORDER — LOSARTAN 50 MG TAB
50 mg | Freq: Every day | ORAL | Status: DC
Start: 2017-10-09 — End: 2017-10-11
  Administered 2017-10-09 – 2017-10-11 (×3): via ORAL

## 2017-10-09 MED ORDER — AMLODIPINE 5 MG TAB
5 mg | Freq: Every day | ORAL | Status: DC
Start: 2017-10-09 — End: 2017-10-11
  Administered 2017-10-09 – 2017-10-11 (×3): via ORAL

## 2017-10-09 MED ORDER — SODIUM CHLORIDE 0.9% BOLUS IV
0.9 % | Freq: Once | INTRAVENOUS | Status: AC
Start: 2017-10-09 — End: 2017-10-09
  Administered 2017-10-09: 05:00:00 via INTRAVENOUS

## 2017-10-09 MED ORDER — GUAIFENESIN 100 MG/5 ML ORAL LIQUID
100 mg/5 mL | ORAL | Status: DC | PRN
Start: 2017-10-09 — End: 2017-10-10

## 2017-10-09 MED ORDER — BISACODYL 10 MG RECTAL SUPPOSITORY
10 mg | Freq: Every day | RECTAL | Status: DC | PRN
Start: 2017-10-09 — End: 2017-10-11

## 2017-10-09 MED ORDER — ROSUVASTATIN 10 MG TAB
10 mg | Freq: Every evening | ORAL | Status: DC
Start: 2017-10-09 — End: 2017-10-11
  Administered 2017-10-09 – 2017-10-11 (×3): via ORAL

## 2017-10-09 MED ORDER — IPRATROPIUM-ALBUTEROL 2.5 MG-0.5 MG/3 ML NEB SOLUTION
2.5 mg-0.5 mg/3 ml | RESPIRATORY_TRACT | Status: AC
Start: 2017-10-09 — End: 2017-10-09

## 2017-10-09 MED ORDER — DIPHENHYDRAMINE 25 MG CAP
25 mg | Freq: Four times a day (QID) | ORAL | Status: DC | PRN
Start: 2017-10-09 — End: 2017-10-11

## 2017-10-09 MED ORDER — ALUM-MAG HYDROXIDE-SIMETH 200 MG-200 MG-20 MG/5 ML ORAL SUSP
200-200-20 mg/5 mL | ORAL | Status: DC | PRN
Start: 2017-10-09 — End: 2017-10-11

## 2017-10-09 MED ORDER — FLUTICASONE 50 MCG/ACTUATION NASAL SPRAY, SUSP
50 mcg/actuation | Freq: Every day | NASAL | Status: DC | PRN
Start: 2017-10-09 — End: 2017-10-10
  Administered 2017-10-09: 16:00:00 via NASAL

## 2017-10-09 MED ORDER — DEXTROSE 50% IN WATER (D50W) IV
INTRAVENOUS | Status: DC | PRN
Start: 2017-10-09 — End: 2017-10-11

## 2017-10-09 MED ORDER — LEVALBUTEROL 0.63 MG/3 ML SOLN FOR INHALATION
0.63 mg/3 mL | RESPIRATORY_TRACT | Status: AC
Start: 2017-10-09 — End: 2017-10-09
  Administered 2017-10-09: 11:00:00 via RESPIRATORY_TRACT

## 2017-10-09 MED ORDER — GLUCAGON 1 MG INJECTION
1 mg | INTRAMUSCULAR | Status: DC | PRN
Start: 2017-10-09 — End: 2017-10-11

## 2017-10-09 MED ORDER — BENZONATATE 100 MG CAP
100 mg | Freq: Three times a day (TID) | ORAL | Status: DC | PRN
Start: 2017-10-09 — End: 2017-10-11
  Administered 2017-10-09: 15:00:00 via ORAL

## 2017-10-09 MED ORDER — SODIUM CHLORIDE 0.9 % IV
INTRAVENOUS | Status: AC
Start: 2017-10-09 — End: 2017-10-09

## 2017-10-09 MED ORDER — POLYETHYLENE GLYCOL 3350 17 GRAM (100 %) ORAL POWDER PACKET
17 gram | Freq: Every day | ORAL | Status: DC | PRN
Start: 2017-10-09 — End: 2017-10-11

## 2017-10-09 MED ORDER — ENOXAPARIN 40 MG/0.4 ML SUB-Q SYRINGE
40 mg/0.4 mL | Freq: Every day | SUBCUTANEOUS | Status: DC
Start: 2017-10-09 — End: 2017-10-11
  Administered 2017-10-10 – 2017-10-11 (×2): via SUBCUTANEOUS

## 2017-10-09 MED ORDER — AZITHROMYCIN 250 MG TAB
250 mg | Freq: Every day | ORAL | Status: AC
Start: 2017-10-09 — End: 2017-10-09
  Administered 2017-10-09: 14:00:00 via ORAL

## 2017-10-09 MED ORDER — METHYLPREDNISOLONE (PF) 40 MG/ML IJ SOLR
40 mg/mL | Freq: Two times a day (BID) | INTRAMUSCULAR | Status: DC
Start: 2017-10-09 — End: 2017-10-11
  Administered 2017-10-09 – 2017-10-11 (×5): via INTRAVENOUS

## 2017-10-09 MED ORDER — MONTELUKAST 10 MG TAB
10 mg | Freq: Every evening | ORAL | Status: DC
Start: 2017-10-09 — End: 2017-10-11
  Administered 2017-10-09 – 2017-10-11 (×3): via ORAL

## 2017-10-09 MED ORDER — ONDANSETRON (PF) 4 MG/2 ML INJECTION
4 mg/2 mL | Freq: Four times a day (QID) | INTRAMUSCULAR | Status: DC | PRN
Start: 2017-10-09 — End: 2017-10-11

## 2017-10-09 MED ORDER — GABAPENTIN 100 MG CAP
100 mg | Freq: Two times a day (BID) | ORAL | Status: DC
Start: 2017-10-09 — End: 2017-10-11
  Administered 2017-10-09 – 2017-10-11 (×5): via ORAL

## 2017-10-09 MED ORDER — TRAZODONE 50 MG TAB
50 mg | Freq: Every evening | ORAL | Status: DC | PRN
Start: 2017-10-09 — End: 2017-10-10

## 2017-10-09 MED ORDER — NABUMETONE 500 MG TAB
500 mg | Freq: Every day | ORAL | Status: DC
Start: 2017-10-09 — End: 2017-10-11
  Administered 2017-10-09 – 2017-10-11 (×3): via ORAL

## 2017-10-09 MED ORDER — INSULIN LISPRO 100 UNIT/ML INJECTION
100 unit/mL | SUBCUTANEOUS | Status: DC | PRN
Start: 2017-10-09 — End: 2017-10-11
  Administered 2017-10-10 – 2017-10-11 (×4): via SUBCUTANEOUS

## 2017-10-09 MED ORDER — AZITHROMYCIN 250 MG TAB
250 mg | Freq: Every day | ORAL | Status: DC
Start: 2017-10-09 — End: 2017-10-11
  Administered 2017-10-10 – 2017-10-11 (×2): via ORAL

## 2017-10-09 MED ORDER — CLONIDINE 0.1 MG TAB
0.1 mg | Freq: Two times a day (BID) | ORAL | Status: DC
Start: 2017-10-09 — End: 2017-10-11
  Administered 2017-10-09 – 2017-10-11 (×4): via ORAL

## 2017-10-09 MED ORDER — MAGNESIUM OXIDE 400 MG TAB
400 mg | Freq: Every day | ORAL | Status: DC
Start: 2017-10-09 — End: 2017-10-11
  Administered 2017-10-09 – 2017-10-11 (×3): via ORAL

## 2017-10-09 MED ORDER — ACETAMINOPHEN 325 MG TABLET
325 mg | Freq: Four times a day (QID) | ORAL | Status: DC | PRN
Start: 2017-10-09 — End: 2017-10-11

## 2017-10-09 MED ORDER — METHYLPREDNISOLONE (PF) 125 MG/2 ML IJ SOLR
125 mg/2 mL | INTRAMUSCULAR | Status: AC
Start: 2017-10-09 — End: 2017-10-09
  Administered 2017-10-09: 04:00:00 via INTRAVENOUS

## 2017-10-09 MED FILL — PIPERACILLIN-TAZOBACTAM 4.5 GRAM IV SOLR: 4.5 gram | INTRAVENOUS | Qty: 4.5

## 2017-10-09 MED FILL — ALBUTEROL SULFATE 0.083 % (0.83 MG/ML) SOLN FOR INHALATION: 2.5 mg /3 mL (0.083 %) | RESPIRATORY_TRACT | Qty: 1

## 2017-10-09 MED FILL — IPRATROPIUM-ALBUTEROL 2.5 MG-0.5 MG/3 ML NEB SOLUTION: 2.5 mg-0.5 mg/3 ml | RESPIRATORY_TRACT | Qty: 3

## 2017-10-09 MED FILL — AZITHROMYCIN 250 MG TAB: 250 mg | ORAL | Qty: 2

## 2017-10-09 MED FILL — CLONIDINE 0.1 MG TAB: 0.1 mg | ORAL | Qty: 3

## 2017-10-09 MED FILL — SPIRIVA RESPIMAT 2.5 MCG/ACTUATION SOLUTION FOR INHALATION: 2.5 mcg/actuation | RESPIRATORY_TRACT | Qty: 4

## 2017-10-09 MED FILL — BREO ELLIPTA 200 MCG-25 MCG/DOSE POWDER FOR INHALATION: 200-25 mcg/dose | RESPIRATORY_TRACT | Qty: 28

## 2017-10-09 MED FILL — AMLODIPINE 5 MG TAB: 5 mg | ORAL | Qty: 2

## 2017-10-09 MED FILL — SOLU-MEDROL (PF) 40 MG/ML SOLUTION FOR INJECTION: 40 mg/mL | INTRAMUSCULAR | Qty: 1

## 2017-10-09 MED FILL — SPIRONOLACTONE 100 MG TAB: 100 mg | ORAL | Qty: 1

## 2017-10-09 MED FILL — QUETIAPINE SR 50 MG 24 HR TAB: 50 mg | ORAL | Qty: 3

## 2017-10-09 MED FILL — FLUTICASONE 50 MCG/ACTUATION NASAL SPRAY, SUSP: 50 mcg/actuation | NASAL | Qty: 16

## 2017-10-09 MED FILL — MAGNESIUM OXIDE 400 MG TAB: 400 mg | ORAL | Qty: 1

## 2017-10-09 MED FILL — SOLU-MEDROL (PF) 125 MG/2 ML SOLUTION FOR INJECTION: 125 mg/2 mL | INTRAMUSCULAR | Qty: 2

## 2017-10-09 MED FILL — BENZONATATE 100 MG CAP: 100 mg | ORAL | Qty: 1

## 2017-10-09 MED FILL — NABUMETONE 500 MG TAB: 500 mg | ORAL | Qty: 1.5

## 2017-10-09 MED FILL — ISOVUE-370  76 % INTRAVENOUS SOLUTION: 370 mg iodine /mL (76 %) | INTRAVENOUS | Qty: 90

## 2017-10-09 MED FILL — GABAPENTIN 100 MG CAP: 100 mg | ORAL | Qty: 1

## 2017-10-09 NOTE — ED Notes (Signed)
Pt settled in new room, cleaned and changed, pure wick put in place, denies any needs, husband at bedside
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
## 2017-10-09 NOTE — ED Notes (Signed)
TRANSFER - OUT REPORT:    Verbal report given to Alyssa, RN(name) on [employer]  being transferred to [address](unit) for routine progression of care       Report consisted of patient???s Situation, Background, Assessment and   Recommendations(SBAR).     Information from the following report(s) ED Summary was reviewed with the receiving nurse.    Lines:   Peripheral IV 10/09/17 Left Antecubital (Active)        Opportunity for questions and clarification was provided.      Patient transported with:   Registered Nurse

## 2017-10-09 NOTE — Progress Notes (Signed)
Problem: Falls - Risk of  Goal: *Absence of Falls  Description  Document Schmid Fall Risk and appropriate interventions in the flowsheet.  Note:   Fall Risk Interventions:  Mobility Interventions: Bed/chair exit alarm         Medication Interventions: Bed/chair exit alarm    Elimination Interventions: Bed/chair exit alarm

## 2017-10-09 NOTE — ED Notes (Signed)
Pt started having trouble breathing using accessory muscles and acting very confused with HR in 130s, Dr. Wenda Low called and told about situation and RN requested xopenex, provider said I could order it along with a CBC and BMP and POC glucose

## 2017-10-09 NOTE — ED Notes (Signed)
Dr. Judith Blonder asked me to watch for results of the CT of the chest as well as a CT of the head.  The patient has already been admitted to the [facility] hospitalist.    CTA chest as read by [facility]:    There is no aortic dissection or aneurysm. There is no filling defects seen in the pulmonary arteries. No evidence for pulmonary embolism.     Heart size is normal.  There is no pericardial effusion. No adenopathy within the mediastinum or hilar regions.     Bullous emphysematous changes.   1.7 cm cavitary lesion left lower lobe, image 52 series 4 with a  soft tissue density  essentially consistent with a fungal infection.   Areas of scarring noted lungs noted similar to last exam.   Right thyroid nodule is noted   Indeterminate 1.3 cm low-density lesion in the spleen.  This finding is similar to previous exam. Advise nonemergent workup.  This may be a possible complex cyst or hemangioma versus other etiologies.    Impression  No evidence for pulmonary embolism.     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 This finding is similar to previous exam.   Bullous emphysematous changes.   Right thyroid nodule is noted   Indeterminate 1.3 cm low-density lesion in the spleen.  This finding is similar to previous exam. Advise nonemergent workup with ultrasound.  This may be a possible complex cyst or hemangioma versus other etiologies.    CT of the head is read by [facility]:    Stable 11 mm lesion left posterior fossa adjacent to tentorium.   Ventricles and sulci show atrophy consistent with  advanced age.   Patchy low-attenuation areas in the deep  white matter consistent with minimal  to moderate small vessel ischemic white matter disease noted.     There is no evidence for mass, mass effect or midline shift.   The third and fourth ventricles are patent.   Basal cisterns are unremarkable.     Bony structures are unremarkable.   Mastoid air cells are within normal limits.    Paranasal sinuses are normal.    Impression  No acute intracranial process identified. If indicated  and depending upon review of laboratory and clinical data, MRI examination or other examination  may be  of value as it may be more sensitive for pathology.     Stable 11 mm lesion left posterior fossa adjacent to tentorium.    I discussed these findings with Dr. Wenda Low.  No significant action items at this time.

## 2017-10-09 NOTE — Progress Notes (Signed)
Nebulizer treatment given by nurse.

## 2017-10-09 NOTE — ED Notes (Signed)
Pt removed IV. Pt in and out of confused state. Same as report given on patient.

## 2017-10-09 NOTE — ED Notes (Signed)
Pt resting with eyes closed, call bell within reach, no distress noted, will continue to monitor

## 2017-10-09 NOTE — ED Notes (Signed)
Pt given another blanket, call bell within reach, denies any needs at this time, will continue to monitor

## 2017-10-09 NOTE — Progress Notes (Signed)
Patient seen and examined:  AECOPD  DM 2  Acute encephalopathy   GERD  Chronic R hip pain  Hepatitis C  HTN  History of AKI requiring temporary HD  History of septic shock from UTI and pneumonia  History of metabolic encephalopathy, requiring intubation for airway protection  ??  Plan   Check ABG  Bronchodilators. Steroids. Antibiotics  [place]  Renal dose medications, avoid nephrotoxins  DVT prophylaxis

## 2017-10-09 NOTE — ED Notes (Signed)
Pt given another blanket, denies any further needs, will continue to monitor

## 2017-10-09 NOTE — ED Notes (Signed)
Respiratory called for ABG.

## 2017-10-09 NOTE — Other (Signed)
Bedside and Verbal shift change report given to Wess Botts, RN (oncoming nurse) by Lacie Draft (offgoing nurse). Report included the following information SBAR, ED Summary and Recent Results.

## 2017-10-09 NOTE — ED Notes (Signed)
Report given to loyette rn

## 2017-10-10 LAB — DRUG SCREEN, URINE
Amphetamine: NEGATIVE
Barbiturates: NEGATIVE
Benzodiazepines: NEGATIVE
Cocaine: NEGATIVE
Marijuana: NEGATIVE
Methadone: NEGATIVE
Opiates: NEGATIVE
Phencyclidine: NEGATIVE

## 2017-10-10 LAB — GLUCOSE, POC
Glucose (POC): 233 mg/dL — ABNORMAL HIGH (ref 65–105)
Glucose (POC): 171 mg/dL — ABNORMAL HIGH (ref 65–105)
Glucose (POC): 257 mg/dL — ABNORMAL HIGH (ref 65–105)
Glucose (POC): 240 mg/dL — ABNORMAL HIGH (ref 65–105)

## 2017-10-10 MED ORDER — LEVALBUTEROL 1.25 MG/3 ML SOLN FOR INHALATION
1.25 mg/3 mL | RESPIRATORY_TRACT | Status: DC
Start: 2017-10-10 — End: 2017-10-11
  Administered 2017-10-10 – 2017-10-11 (×7): via RESPIRATORY_TRACT

## 2017-10-10 MED ORDER — FLUTICASONE 50 MCG/ACTUATION NASAL SPRAY, SUSP
50 mcg/actuation | Freq: Every day | NASAL | Status: DC
Start: 2017-10-10 — End: 2017-10-11
  Administered 2017-10-10 – 2017-10-11 (×2): via NASAL

## 2017-10-10 MED ORDER — GUAIFENESIN 100 MG/5 ML ORAL LIQUID
100 mg/5 mL | Freq: Four times a day (QID) | ORAL | Status: DC
Start: 2017-10-10 — End: 2017-10-11
  Administered 2017-10-10 – 2017-10-11 (×5): via ORAL

## 2017-10-10 MED FILL — LEVALBUTEROL 1.25 MG/3 ML SOLN FOR INHALATION: 1.25 mg/3 mL | RESPIRATORY_TRACT | Qty: 3

## 2017-10-10 MED FILL — MAGNESIUM OXIDE 400 MG TAB: 400 mg | ORAL | Qty: 1

## 2017-10-10 MED FILL — SOLU-MEDROL (PF) 40 MG/ML SOLUTION FOR INJECTION: 40 mg/mL | INTRAMUSCULAR | Qty: 1

## 2017-10-10 MED FILL — BREO ELLIPTA 200 MCG-25 MCG/DOSE POWDER FOR INHALATION: 200-25 mcg/dose | RESPIRATORY_TRACT | Qty: 28

## 2017-10-10 MED FILL — CLONIDINE 0.1 MG TAB: 0.1 mg | ORAL | Qty: 3

## 2017-10-10 MED FILL — SPIRIVA RESPIMAT 2.5 MCG/ACTUATION SOLUTION FOR INHALATION: 2.5 mcg/actuation | RESPIRATORY_TRACT | Qty: 4

## 2017-10-10 MED FILL — GUAIFENESIN 100 MG/5 ML ORAL LIQUID: 100 mg/5 mL | ORAL | Qty: 10

## 2017-10-10 MED FILL — LOSARTAN 50 MG TAB: 50 mg | ORAL | Qty: 2

## 2017-10-10 MED FILL — NABUMETONE 500 MG TAB: 500 mg | ORAL | Qty: 2

## 2017-10-10 MED FILL — SPIRONOLACTONE 100 MG TAB: 100 mg | ORAL | Qty: 1

## 2017-10-10 MED FILL — AZITHROMYCIN 250 MG TAB: 250 mg | ORAL | Qty: 1

## 2017-10-10 MED FILL — GABAPENTIN 100 MG CAP: 100 mg | ORAL | Qty: 1

## 2017-10-10 MED FILL — QUETIAPINE SR 50 MG 24 HR TAB: 50 mg | ORAL | Qty: 3

## 2017-10-10 MED FILL — ENOXAPARIN 40 MG/0.4 ML SUB-Q SYRINGE: 40 mg/0.4 mL | SUBCUTANEOUS | Qty: 0.4

## 2017-10-10 MED FILL — PANTOPRAZOLE 40 MG TAB, DELAYED RELEASE: 40 mg | ORAL | Qty: 1

## 2017-10-10 MED FILL — AMLODIPINE 5 MG TAB: 5 mg | ORAL | Qty: 2

## 2017-10-10 MED FILL — FLUTICASONE 50 MCG/ACTUATION NASAL SPRAY, SUSP: 50 mcg/actuation | NASAL | Qty: 16

## 2017-10-10 MED FILL — ROSUVASTATIN 10 MG TAB: 10 mg | ORAL | Qty: 1

## 2017-10-10 MED FILL — MONTELUKAST 10 MG TAB: 10 mg | ORAL | Qty: 1

## 2017-10-10 NOTE — Progress Notes (Signed)
INTERNAL MEDICINE PROGRESS NOTE    Date of note:      October 10, 2017    Patient:               [name] [name], 67 y.o., female  Admit Date:        10/08/2017  Length of Stay:  0 day(s)    Problem List:   Patient Active Problem List   Diagnosis Code   ??? Severe hypertension I10   ??? 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 PTSD (post-traumatic stress disorder) F43.10   ??? Severe headache R51   ??? Accelerated hypertension I10   ??? Acute colitis K52.9   ??? Acute hyperglycemia R73.9   ??? Type 2 diabetes mellitus with hyperglycemia, without long-term current use of insulin (HCC) E11.65   ??? 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 Lumbar and sacral osteoarthritis M47.817   ??? Chronic pain syndrome G89.4   ??? Essential hypertension I10   ??? Sacroiliitis (HCC) M46.1   ??? 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 Lactic acidosis E87.2   ??? Leukocytosis D72.829   ??? Sepsis (HCC) A41.9   ??? Asthma exacerbation J45.901   ??? Asthma with acute exacerbation J45.901   ??? Gastritis K29.70   ??? Nausea & vomiting R11.2   ??? 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 Cannabinoid hyperemesis syndrome (HCC) F12.988   ??? 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 Chest pain R07.9   ??? Dyspnea R06.00   ??? Dehydration E86.0   ??? COPD with acute exacerbation (HCC) J44.1   ??? Acute-on-chronic kidney injury (HCC) N17.9, N18.9   ??? Hyperkalemia E87.5   ??? Obtunded R40.1   ??? Acute renal failure (ARF) (HCC) N17.9   ??? Septic shock (HCC) A41.9, R65.21   ??? Metabolic encephalopathy G93.41   ??? SIRS (systemic inflammatory response syndrome) (HCC) R65.10       Subjective + interval history     Husband at bedside  Patient is feeling better. SOB is better. She is more alert and at her baseline currently    Assessment :     1. Acute hypoxemic respiratory failure present on admission  2. AECOPD  3. DM 2  4. Recent hospitalization - 09/08/17 to 09/14/17 - septic shock due to  pneumonia, UTI, required intubation for airway protection and temporary dialysis for Acute renal failure History of AKI requiring temporary HD  5. GERD  6. Chronic R hip pain  7. Hepatitis C  8. HTN  9. Dementia         Plan :   CTA chest - no PE  CT head - 1. Diffuse cerebral atrophy and periventricular microvascular ischemia. No  2. 10 mm probable meningioma left posterior fossa, unchanged.  ??  CXR - Bilateral bullous emphysema.    - continue supplemental oxygen to maintain SPO2 > 90    - Xopenex, IV steroid, Azithromycin, singulair, spiriva - patient follows up with Dr. Darrol Poke    - continue amlodipine, clonidine, aldactone, losartan, crestor ( home regimen)    - husband reports that patient has gradually worsening dementia. Will get gerontology consult.     - minimize polypharmacy, check urine drug screen     - lovenox for dvt ppx    protonix for GI PPx        - Code status: full code    Recommend to continue hospitalization.  Expected date of discharge: tomorrow  Plan for disposition - home tomorrow if continues to improve    Objective:     Visit Vitals  BP 118/83 (BP Patient Position: Supine)   Pulse 95   Temp 98.2 ??F (36.8 ??C)   Resp 20   Ht  (1.727 m)   Wt 82.6 kg (182 lb)   SpO2 100%   BMI 27.67 kg/m??           Intake/Output Summary (Last 24 hours) at 10/10/2017 4540  Last data filed at 10/10/2017 9811  Gross per 24 hour   Intake 260 ml   Output 700 ml   Net -440 ml       Physical Exam:     GEN - Alert, oriented to person, place, time but slow in answering questions and poor short term memory  HEENT -mucous membranes moist  Neck - supple, no JVD  Cardiac - RRR, S1, S2, no murmurs  Chest/Lungs - mild expiratory wheezing  Abdomen - soft, obese, non tender  Extremities - trace ankle edema  Neuro - CN 2-12 intact. No focal deficits. No motor or sensory deficit appreciated.   Skin - no rashes or lesions    Current medications:       Current Facility-Administered Medications:    ???  albuterol (PROVENTIL VENTOLIN) nebulizer solution 2.5 mg, 2.5 mg, Nebulization, Q4H PRN, Matriano, Thamas Jaegers, MD  ???  azithromycin [facility]) tablet 250 mg, 250 mg, Oral, DAILY, Matriano, Thamas Jaegers, MD, 250 mg at 10/10/17 9147  ???  benzonatate (TESSALON) capsule 100 mg, 100 mg, Oral, TID PRN, Matriano, Thamas Jaegers, MD, 100 mg at 10/09/17 1033  ???  guaiFENesin (ROBITUSSIN) 100 mg/5 mL oral liquid 100 mg, 100 mg, Oral, Q4H PRN, Matriano, Samuel Germany H, MD  ???  melatonin tablet 3 mg, 3 mg, Oral, QHS PRN, Matriano, Thamas Jaegers, MD  ???  methylPREDNISolone (PF) (SOLU-MEDROL) injection 40 mg, 40 mg, IntraVENous, Q12H, Matriano, Thamas Jaegers, MD, 40 mg at 10/10/17 0813  ???  alum-mag hydroxide-simeth (MYLANTA) oral suspension 30 mL, 30 mL, Oral, Q4H PRN, Matriano, Thamas Jaegers, MD  ???  diphenhydrAMINE (BENADRYL) capsule 25 mg, 25 mg, Oral, Q6H PRN, Matriano, Thamas Jaegers, MD  ???  enoxaparin (LOVENOX) injection 40 mg, 40 mg, SubCUTAneous, DAILY, Matriano, Thamas Jaegers, MD, 40 mg at 10/10/17 0814  ???  acetaminophen (TYLENOL) tablet 650 mg, 650 mg, Oral, Q6H PRN, Matriano, Thamas Jaegers, MD  ???  ondansetron (ZOFRAN) injection 4 mg, 4 mg, IntraVENous, Q6H PRN, Matriano, Thamas Jaegers, MD  ???  promethazine (PHENERGAN) tablet 25 mg, 25 mg, Oral, Q6H PRN, Matriano, Thamas Jaegers, MD  ???  docusate sodium (COLACE) capsule 100 mg, 100 mg, Oral, BID PRN, Matriano, Thamas Jaegers, MD  ???  bisacodyl (DULCOLAX) suppository 10 mg, 10 mg, Rectal, DAILY PRN, Matriano, Thamas Jaegers, MD  ???  polyethylene glycol (MIRALAX) packet 17 g, 17 g, Oral, DAILY PRN, Matriano, Thamas Jaegers, MD  ???  amLODIPine (NORVASC) tablet 10 mg, 10 mg, Oral, DAILY, Matriano, Thamas Jaegers, MD, 10 mg at 10/10/17 0815  ???  cloNIDine HCl (CATAPRES) tablet 0.3 mg, 0.3 mg, Oral, BID, Matriano, Thamas Jaegers, MD, 0.3 mg at 10/10/17 8295  ???  fluticasone-vilanterol (BREO ELLIPTA) 240mcg-25mcg/puff, 1 Puff, Inhalation, DAILY, Matriano, Thamas Jaegers, MD, 1 Puff at 10/09/17 1220   ???  gabapentin (NEURONTIN) capsule 100 mg, 100 mg, Oral, BID, Matriano,  Thamas Jaegers, MD, 100 mg at 10/10/17 0815  ???  losartan (COZAAR) tablet 100 mg, 100 mg, Oral, DAILY, Matriano, Thamas Jaegers, MD, 100 mg at 10/10/17 1610  ???  magnesium oxide (MAG-OX) tablet 400 mg, 400 mg, Oral, DAILY, Matriano, Thamas Jaegers, MD, 400 mg at 10/10/17 0815  ???  montelukast (SINGULAIR) tablet 10 mg, 10 mg, Oral, QHS, Matriano, Samuel Germany H, MD, 10 mg at 10/09/17 2210  ???  nabumetone (RELAFEN) tablet 750 mg, 750 mg, Oral, DAILY, Matriano, Thamas Jaegers, MD, 750 mg at 10/10/17 9604  ???  QUEtiapine SR (SEROquel XR) tablet 150 mg, 150 mg, Oral, DAILY, Matriano, Thamas Jaegers, MD, 150 mg at 10/10/17 0815  ???  rosuvastatin (CRESTOR) tablet 5 mg, 5 mg, Oral, QHS, Matriano, Thamas Jaegers, MD, 5 mg at 10/09/17 2210  ???  spironolactone (ALDACTONE) tablet 100 mg, 100 mg, Oral, DAILY, Matriano, Thamas Jaegers, MD, 100 mg at 10/10/17 0815  ???  tiotropium bromide (SPIRIVA RESPIMAT) 2.5 mcg /actuation, 1 Puff, Inhalation, DAILY, Matriano, Thamas Jaegers, MD, 1 Puff at 10/09/17 1220  ???  traZODone (DESYREL) tablet 50 mg, 50 mg, Oral, QHS PRN, Matriano, Thamas Jaegers, MD  ???  fluticasone propionate (FLONASE) 50 mcg/actuation nasal spray 2 Spray, 2 Spray, Both Nostrils, DAILY PRN, Matriano, Thamas Jaegers, MD, 2 Spray at 10/09/17 1220  ???  pantoprazole (PROTONIX) tablet 40 mg, 40 mg, Oral, ACB, Matriano, Thamas Jaegers, MD, 40 mg at 10/10/17 0542  ???  dextrose (D50) infusion 5-25 g, 10-50 mL, IntraVENous, PRN, Amedeo Gory, MD  ???  glucagon (GLUCAGEN) injection 1 mg, 1 mg, IntraMUSCular, PRN, Amedeo Gory, MD  ???  insulin glargine (LANTUS) injection 1-100 Units, 1-100 Units, SubCUTAneous, QHS, Amedeo Gory, MD, 13 Units at 10/09/17 2219  ???  insulin lispro (HUMALOG) injection, , SubCUTAneous, AC&HS, Amedeo Gory, MD, 1 Units at 10/09/17 2218  ???  insulin lispro (HUMALOG) injection 1-100 Units, 1-100 Units, SubCUTAneous, PRN, Amedeo Gory, MD    Labs:      Recent Results (from the past 24 hour(s))   GLUCOSE, POC    Collection Time: 10/09/17  9:58 AM   Result Value Ref Range    Glucose (POC) 255 (H) 65 - 105 mg/dL   LACTIC ACID    Collection Time: 10/09/17 10:45 AM   Result Value Ref Range    Lactic Acid 1.6 0.4 - 2.0 mmol/L   BLOOD GAS, ARTERIAL    Collection Time: 10/09/17 11:44 AM   Result Value Ref Range    pH 7.410 7.350 - 7.450      PCO2 33.9 (L) 35.0 - 45.0 mm Hg    PO2 68.2 (L) 75 - 100 mm Hg    BICARBONATE 21.5 18.0 - 26.0 mmol/L    O2 SAT 93.9 90 - 100 %    CO2, TOTAL 22.6 (L) 24 - 29 mmol/L    BASE EXCESS -3 (L) -2 - 3 mmol/L    Patient temp. 98.3      Sample type ARTERIAL      FIO2 21      SITE R Radial      DEVICE Room Air      ALLENS TEST Positive      Performed by [id number]     GLUCOSE, POC    Collection Time: 10/09/17  1:56 PM   Result Value Ref Range    Glucose (POC) 228 (H) 65 - 105 mg/dL   GLUCOSE, POC  Collection Time: 10/09/17  6:51 PM   Result Value Ref Range    Glucose (POC) 209 (H) 65 - 105 mg/dL   GLUCOSE, POC    Collection Time: 10/09/17 10:00 PM   Result Value Ref Range    Glucose (POC) 233 (H) 65 - 105 mg/dL   GLUCOSE, POC    Collection Time: 10/10/17  7:57 AM   Result Value Ref Range    Glucose (POC) 171 (H) 65 - 105 mg/dL       XR Results:  Results from Hospital Encounter encounter on 10/08/17   XR CHEST SNGL V    Narrative INDICATION:  sepsis       EXAMINATION:  XR CHEST SNGL V    COMPARISON:  09/13/2017    FINDINGS:  The study shows a normal sized heart.. Bilateral bullous emphysema..          Impression IMPRESSION:  Bilateral bullous emphysema.         CT Results:  Results from Hospital Encounter encounter on 10/08/17   CTA CHEST W OR W WO CONT    Narrative DICOM format image data is available to non-affiliated external healthcare  facilities or entities on a secure, media free, reciprocally searchable basis  with patient authorization for 12 months following the date of the study.    Clinical history: Chest pain    EXAMINATION:   CTA chest with contrast. 3 mm spiral scanning is performed from the lung apices  to the upper poles of the kidneys. Coronal, sagittal and 3-D MIP sequences have  been obtained.    Correlation: 08/06/2017    FINDINGS:  There are degenerative changes of the spine. Trachea, right and left mainstem  bronchi are patent. There are bilateral axillary and compressive atelectasis.  1.8 x 1.8 cm well-circumscribed cavitary lesion left lower lobe not  significantly changed from 08/06/2017 and 11/03/2016 (increased in size from  07/03/2015, 1.3 x 1.3 cm).    2.3 cm right thyroid nodule, not significantly changed. The esophagus is not  dilated. Ascending aorta measures 4.4 cm. No lymph node enlargement in the  axilla, mediastinum or hila. No pulmonary embolism.    Visualized portions of the liver, gallbladder, pancreas and adrenal glands are  unremarkable. 13 mm indeterminate splenic lesion.      Impression IMPRESSION:  1. No pulmonary embolism.  2. Bilateral large bullae with compressive atelectasis.  3. 18 x 18 mm left lower lobe cavitary nodule (not significantly changed from  prior study although increased in size from 07/03/2015 - 1.3 x 1.3 cm).  4. 2.3 cm right thyroid nodule.  5. 1.3 cm indeterminate splenic lesion.    Initial interpretation: [employer].         MRI Results:  Results from Hospital Encounter encounter on 04/07/15   MRI BRAIN W WO CONT    Narrative CLINICAL HISTORY:  Abnormal CT, meningioma    PROCEDURE: MRI brain without and with contrast    COMPARISON: Head CT dated 04/08/2015    FINDINGS:    There is age-related cerebral atrophy. There is extensive chronic small vessel  ischemic change, advanced for patient age. As noted on CT, there is a enhancing  extra-axial mass in the left CP angle along the petrous temporal bone measuring  10 mm. This is observed on image 9 series 12, image 6 series 11 and image 21  series 13. There are dural tails extending to the tentorium cerebelli. Imaging   findings would favor meningioma. No other areas of  abnormal brain parenchymal  enhancement. The sella, pineal region, craniocervical junction are unremarkable.  Skull base flow voids are patent.      Impression IMPRESSION:  1. Enhancing mass in the left posterior fossa adjacent to the petrous temporal  bone as described above. Main consideration would include meningioma. Other less  likely differential considerations would include dural metastasis and lymphoma.  Consider surveillance or further evaluation.    2.  Chronic small vessel ischemic changes, advanced for patient age.         Nuclear Medicine Results:  No results found for this or any previous visit.    [place] Results:  Results from Hospital Encounter encounter on 09/08/17   [place] RETROPERITONEUM COMP    Narrative INDICATION:  AKI        PROCEDURE:  [place] RETROPERITONEUM COMP    COMPARISON:  None    FINDINGS:  The right kidney measures 10.8 x 5.8 x 5 cm. Shows mild hydronephrosis. There is  some small amount of perinephric fluid. Question upper pole nonobstructing stone  measuring 6 mm. The kidney is echogenic.    Left kidney measures 12.1 x 5.6 x 4.6 cm. Shows a simple upper pole cyst  measuring 9 x 7 x 7 mm. The kidney is echogenic. Question of an upper pole  nonobstructing stone measuring 4 mm. Small amount of left perinephric fluid.    Foley catheter in place.    Echogenic liver.      Impression IMPRESSION:  1. Echogenic kidneys consistent with medical renal disease.  2. Small amount of bilateral perinephric fluid.  3. Probable small bilateral nonobstructing stones.  4. Upper pole simple cyst of the left kidney measuring 7 x 7 x 9 mm.  5. Fatty infiltration of the liver.         IR Results:  No results found for this or any previous visit.    VAS/US Results:  No results found for this or any previous visit.      Total clinical care time was 40  minutes of which more than 50% was spent in coordination of care and counseling (time spent with patient/family  face to face, physical exam, reviewing laboratory and imaging investigations, speaking with physicians and nursing staff involved in this patient's care).     Tia Masker,  MD  [facility]  October 10, 2017  Time: 9:21 AM

## 2017-10-10 NOTE — Telephone Encounter (Signed)
Patient is requesting a call back from [place] to her hospital room. Phone number is [phone].

## 2017-10-10 NOTE — Progress Notes (Signed)
PAGER ID: [phone]   MESSAGE: RM [phone] [name] [name] is in pain. She only has Tylenol in the [facility]. She has nabumetone  twice/day for pain in [facility]. she has taken Percocet before. please call Jaclyn Shaggy [phone] and thanks.

## 2017-10-10 NOTE — Progress Notes (Signed)
Problem: Falls - Risk of  Goal: *Absence of Falls  Description  Document [name] Fall Risk and appropriate interventions in the flowsheet.  Outcome: Progressing Towards Goal  Note:   Fall Risk Interventions:  Mobility Interventions: Bed/chair exit alarm, Patient to call before getting OOB    Mentation Interventions: Adequate sleep, hydration, pain control, Bed/chair exit alarm, Family/sitter at bedside, Reorient patient, Room close to nurse's station    Medication Interventions: Bed/chair exit alarm, Patient to call before getting OOB, Teach patient to arise slowly    Elimination Interventions: Bed/chair exit alarm, Call light in reach, Patient to call for help with toileting needs     Problem: Pressure Injury - Risk of  Goal: *Prevention of pressure injury  Description  Document Braden Scale and appropriate interventions in the flowsheet.  Outcome: Progressing Towards Goal  Note:   Pressure Injury Interventions:       Moisture Interventions: Absorbent underpads, Apply protective barrier, creams and emollients, Internal/External urinary devices, Maintain skin hydration (lotion/cream), Minimize layers    Activity Interventions: Increase time out of bed, Pressure redistribution bed/mattress(bed type)    Mobility Interventions: Pressure redistribution bed/mattress (bed type)    Nutrition Interventions: Document food/fluid/supplement intake    Friction and Shear Interventions: Apply protective barrier, creams and emollients, Lift sheet, Minimize layers     Problem: Gas Exchange - Impaired  Goal: *Absence of hypoxia  Outcome: Progressing Towards Goal

## 2017-10-10 NOTE — Progress Notes (Signed)
Patient admitted on 10/08/2017 from home with   Chief Complaint   Patient presents with   ??? Shortness of Breath     scatter wheeze though out, resp treatment started in [place].         The patient has been admitted to the hospital 3 times in the past 12 months.    Tentative dc plan:    home    Anticipated Discharge Date:   10/11/2017    PCP: [name], PA-C   Dr. Baruch Merl      Face sheet information, address, contact info and insurance verified yes    Pharmacy:     [facility]  Any issues getting medications no    DME at home and provider:    Gilmer Mor, stair lift, and neb    Home Environment:    Lives at [address]  [place] [address]    @.     Prior to admission open services:     yes    Home Health Agency-     [facility]    Extended Emergency Contact Information  Primary Emergency Contact: Goodwin,Bobby  Address: [address]           [place], [place] [address] [place]  Home Phone: [phone]  Mobile Phone: [phone]  Relation: Spouse      Transportation:     Family will transport home    Therapy Recommendations: na    Case Management Assessment    ABUSE/NEGLECT SCREENING   Physical Abuse/Neglect: Denies   Sexual Abuse: Denies   Sexual Abuse: Denies   Other Abuse/Issues: Denies          PRIMARY DECISION MAKER                                   CARE MANAGEMENT INTERVENTIONS       PCP Verified by CM: Yes(Dr. Cathey Endow)                   Transition of Care Consult (CM Consult): Home Health                               Current Support Network: Lives with Spouse   Reason for Referral: DCP Rounds   History Provided By: Patient   Patient Orientation: Alert and Oriented   Cognition: Alert   Support System Response: Concerned   Previous Living Arrangement: Lives with Family Independent       Prior Functional Level: Assistance with the following:, Mobility   Current Functional Level: Assistance with the following:, Mobility   Primary Language: English    Can patient return to prior living arrangement: Yes   Ability to make needs known:: Good   Family able to assist with home care needs:: Yes                       Confirm Follow Up Transport: Family       Plan discussed with Pt/Family/Caregiver: Yes   Freedom of Choice Offered: Yes      DISCHARGE LOCATION   Discharge Placement: Home with home health

## 2017-10-11 LAB — METABOLIC PANEL, BASIC
Anion gap: 8 mmol/L (ref 5–15)
BUN: 32 mg/dl — ABNORMAL HIGH (ref 7–25)
CO2: 24 mEq/L (ref 21–32)
Calcium: 9.2 mg/dl (ref 8.5–10.1)
Chloride: 104 mEq/L (ref 98–107)
Creatinine: 1.3 mg/dl (ref 0.6–1.3)
GFR est AA: 53
GFR est non-AA: 44
Glucose: 296 mg/dl — ABNORMAL HIGH (ref 74–106)
Potassium: 4.7 mEq/L (ref 3.5–5.1)
Sodium: 136 mEq/L (ref 136–145)

## 2017-10-11 LAB — CBC WITH AUTOMATED DIFF
BASOPHILS: 0.1 % (ref 0–3)
EOSINOPHILS: 0 % (ref 0–5)
HCT: 36.3 % — ABNORMAL LOW (ref 37.0–50.0)
HGB: 12 gm/dl — ABNORMAL LOW (ref 13.0–17.2)
IMMATURE GRANULOCYTES: 1.3 % (ref 0.0–3.0)
LYMPHOCYTES: 8.3 % — ABNORMAL LOW (ref 28–48)
MCH: 29.4 pg (ref 25.4–34.6)
MCHC: 33.1 gm/dl (ref 30.0–36.0)
MCV: 89 fL (ref 80.0–98.0)
MONOCYTES: 4.5 % (ref 1–13)
MPV: 11.2 fL — ABNORMAL HIGH (ref 6.0–10.0)
NEUTROPHILS: 85.8 % — ABNORMAL HIGH (ref 34–64)
NRBC: 0 (ref 0–0)
PLATELET: 221 10*3/uL (ref 140–450)
RBC: 4.08 M/uL (ref 3.60–5.20)
RDW-SD: 45.2 (ref 36.4–46.3)
WBC: 10.1 10*3/uL (ref 4.0–11.0)

## 2017-10-11 LAB — GLUCOSE, POC
Glucose (POC): 132 mg/dL — ABNORMAL HIGH (ref 65–105)
Glucose (POC): 232 mg/dL — ABNORMAL HIGH (ref 65–105)
Glucose (POC): 227 mg/dL — ABNORMAL HIGH (ref 65–105)

## 2017-10-11 MED ORDER — PREDNISONE 20 MG TAB
20 mg | Freq: Once | ORAL | Status: DC
Start: 2017-10-11 — End: 2017-10-11

## 2017-10-11 MED ORDER — TIOTROPIUM BROMIDE 2.5 MCG/ACTUATION MIST FOR INHALATION
2.5 mcg/actuation | Freq: Every day | RESPIRATORY_TRACT | 0 refills | Status: DC
Start: 2017-10-11 — End: 2021-05-23

## 2017-10-11 MED ORDER — SODIUM CHLORIDE 0.9 % IJ SYRG
INTRAMUSCULAR | Status: AC
Start: 2017-10-11 — End: 2017-10-10
  Administered 2017-10-11: 02:00:00

## 2017-10-11 MED ORDER — PREDNISONE 10 MG TAB
10 mg | ORAL_TABLET | Freq: Every day | ORAL | 0 refills | Status: DC
Start: 2017-10-11 — End: 2017-11-14

## 2017-10-11 MED ORDER — ALBUTEROL SULFATE HFA 90 MCG/ACTUATION AEROSOL INHALER
90 mcg/actuation | RESPIRATORY_TRACT | 0 refills | Status: DC
Start: 2017-10-11 — End: 2017-11-18

## 2017-10-11 MED ORDER — BENZONATATE 100 MG CAP
100 mg | ORAL_CAPSULE | Freq: Three times a day (TID) | ORAL | 0 refills | Status: AC | PRN
Start: 2017-10-11 — End: 2017-10-18

## 2017-10-11 MED ORDER — PREDNISONE 10 MG TAB
10 mg | Freq: Once | ORAL | Status: DC
Start: 2017-10-11 — End: 2017-10-11

## 2017-10-11 MED ORDER — CLONIDINE 0.3 MG TAB
0.3 mg | ORAL_TABLET | ORAL | 1 refills | Status: DC
Start: 2017-10-11 — End: 2018-01-30

## 2017-10-11 MED ORDER — AZITHROMYCIN 500 MG TAB
500 mg | ORAL_TABLET | Freq: Every day | ORAL | 0 refills | Status: DC
Start: 2017-10-11 — End: 2017-11-14

## 2017-10-11 MED ORDER — OXYCODONE-ACETAMINOPHEN 5 MG-325 MG TAB
5-325 mg | ORAL | Status: AC | PRN
Start: 2017-10-11 — End: 2017-10-11
  Administered 2017-10-11 (×2): via ORAL

## 2017-10-11 MED ORDER — FLUTICASONE-SALMETEROL 100 MCG-50 MCG/DOSE DISK DEVICE FOR INHALATION
100-50 mcg/dose | Freq: Two times a day (BID) | RESPIRATORY_TRACT | 0 refills | Status: AC
Start: 2017-10-11 — End: ?

## 2017-10-11 MED FILL — LEVALBUTEROL 1.25 MG/3 ML SOLN FOR INHALATION: 1.25 mg/3 mL | RESPIRATORY_TRACT | Qty: 3

## 2017-10-11 MED FILL — OXYCODONE-ACETAMINOPHEN 5 MG-325 MG TAB: 5-325 mg | ORAL | Qty: 1

## 2017-10-11 MED FILL — GUAIFENESIN 100 MG/5 ML ORAL LIQUID: 100 mg/5 mL | ORAL | Qty: 10

## 2017-10-11 MED FILL — GABAPENTIN 100 MG CAP: 100 mg | ORAL | Qty: 1

## 2017-10-11 MED FILL — PANTOPRAZOLE 40 MG TAB, DELAYED RELEASE: 40 mg | ORAL | Qty: 1

## 2017-10-11 MED FILL — NABUMETONE 500 MG TAB: 500 mg | ORAL | Qty: 2

## 2017-10-11 MED FILL — AZITHROMYCIN 250 MG TAB: 250 mg | ORAL | Qty: 1

## 2017-10-11 MED FILL — SOLU-MEDROL (PF) 40 MG/ML SOLUTION FOR INJECTION: 40 mg/mL | INTRAMUSCULAR | Qty: 1

## 2017-10-11 MED FILL — LOVENOX 40 MG/0.4 ML SUBCUTANEOUS SYRINGE: 40 mg/0.4 mL | SUBCUTANEOUS | Qty: 0.4

## 2017-10-11 MED FILL — LOSARTAN 50 MG TAB: 50 mg | ORAL | Qty: 2

## 2017-10-11 MED FILL — CLONIDINE 0.1 MG TAB: 0.1 mg | ORAL | Qty: 3

## 2017-10-11 MED FILL — SPIRONOLACTONE 100 MG TAB: 100 mg | ORAL | Qty: 1

## 2017-10-11 MED FILL — BD POSIFLUSH NORMAL SALINE 0.9 % INJECTION SYRINGE: INTRAMUSCULAR | Qty: 20

## 2017-10-11 MED FILL — ROSUVASTATIN 10 MG TAB: 10 mg | ORAL | Qty: 1

## 2017-10-11 MED FILL — AMLODIPINE 5 MG TAB: 5 mg | ORAL | Qty: 2

## 2017-10-11 MED FILL — QUETIAPINE SR 50 MG 24 HR TAB: 50 mg | ORAL | Qty: 3

## 2017-10-11 MED FILL — MONTELUKAST 10 MG TAB: 10 mg | ORAL | Qty: 1

## 2017-10-11 MED FILL — MAGNESIUM OXIDE 400 MG TAB: 400 mg | ORAL | Qty: 1

## 2017-10-11 NOTE — Telephone Encounter (Signed)
Returned call and no one answered.

## 2017-10-11 NOTE — Other (Addendum)
----------  DocumentID: [id number]------------------------------------------------              [facility]                       Patient Education Report         Name: [name] [name], [name] [name]                  Date: 10/09/2017    MRN: [id number]                    Time: 7:13:10 PM         Patient ordered video: 'Patient Safety: Stay Safe While you are in the Hospital'    from [id number] via phone number: [phone] at 7:13:10 PM    Description: This program outlines some of the precautions patients can take to ensure a speedy recovery without extra complications. The video emphasizes the importance of communicating with the healthcare team.    ----------DocumentID: [id number]------------------------------------------------                       [facility]          Patient Education Report - Discharge Summary        Date: 10/11/2017   Time: 5:29:52 PM   Name: [name] [name], [name] [name]   MRN: [id number]      Account Number: [id number]      Education History:        Patient ordered video: 'Patient Safety: Stay Safe While you are in the Hospital' from [id number] on 10/09/2017 07:13:10 PM

## 2017-10-11 NOTE — Other (Signed)
Bedside shift change report given to Timothy, RN (oncoming nurse) by Ebony, RN (offgoing nurse). Report included the following information SBAR, Kardex, Intake/Output, MAR and Recent Results.

## 2017-10-11 NOTE — Progress Notes (Signed)
PAGER ID: [phone]   MESSAGE: RM [phone] [name] [name] had an episode of Heart Rate in 140's at 0645. Patient in stable condition. Thank you. Karel Jarvis, [place] [id number]

## 2017-10-11 NOTE — Discharge Summary (Signed)
Discharge Summary   Admit Date: 10/08/2017  Discharge Date:  10/11/2017    Patient ID:  [name] [name]  67 y.o.  September 03, 1950    Chief Complaint   Patient presents with   ??? Shortness of Breath     scatter wheeze though out, resp treatment started in [place].        Patient Active Problem List    Diagnosis Date Noted   ??? SIRS (systemic inflammatory response syndrome) (HCC) 10/09/2017   ??? Hyperkalemia 09/08/2017   ??? Obtunded 09/08/2017   ??? Acute renal failure (ARF) ([place]) 09/08/2017   ??? Septic shock (HCC) 09/60/4540   ??? Metabolic encephalopathy 98/04/9146   ??? Dehydration 08/07/2017   ??? COPD with acute exacerbation ([place]) 08/07/2017   ??? Acute-on-chronic kidney injury ([place]) 08/07/2017   ??? Chest pain 06/15/2017   ??? Dyspnea 06/15/2017   ??? Type 2 diabetes mellitus with diabetic neuropathy ([place]) 12/07/2016   ??? 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 Cannabinoid hyperemesis syndrome ([place]) 08/17/2016   ??? Gastritis 08/16/2016   ??? Nausea & vomiting 08/16/2016   ??? Asthma with acute exacerbation 08/04/2016   ??? Lactic acidosis 07/24/2016   ??? Leukocytosis 07/24/2016   ??? Sepsis ([place]) 07/24/2016   ??? Asthma exacerbation 07/24/2016   ??? Type 2 diabetes mellitus with nephropathy ([place]) 06/12/2016   ??? Sacroiliitis ([place]) 11/25/2015   ??? Essential hypertension 10/06/2015   ??? 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 Lumbar and sacral osteoarthritis 09/15/2015   ??? Chronic pain syndrome 09/15/2015   ??? Type 2 diabetes mellitus with hyperglycemia, without long-term current use of insulin ([place]) 08/20/2015   ??? Acute colitis 07/02/2015   ??? Acute hyperglycemia 07/02/2015   ??? Accelerated hypertension 04/08/2015   ??? Severe headache 04/07/2015   ??? 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 PTSD (post-traumatic stress disorder) 01/29/2015   ??? Severe hypertension 07/21/2014       Discharge Diagnosis:  Acute asthma exacerbation due to bronchitis  HTN  DM II  Bronchitis  Acute on chronic respiratory failure with hypoxia   GERD  CHronic right hip pain.   Hep C      Current Discharge Medication List      START taking these medications    Details   benzonatate (TESSALON) 100 mg capsule Take 1 Cap by mouth three (3) times daily as needed for Cough for up to 7 days.  Qty: 30 Cap, Refills: 0      predniSONE (DELTASONE) 10 mg tablet Take 10 mg by mouth daily (with breakfast). Take 4 PO daily for 3 days  Then 3 PO daily for 3 days  Then 2 PO daily for 3 days  Then 1 PO daily for 3 days  Then DC  Qty: 30 Tab, Refills: 0         CONTINUE these medications which have CHANGED    Details   albuterol (PROVENTIL HFA, VENTOLIN HFA, PROAIR HFA) 90 mcg/actuation inhaler Take 2 Puffs by inhalation every six (6) hours for 5 days, THEN 2 Puffs every four (4) hours as needed for Wheezing or Shortness of Breath for up to 30 days.  Qty: 1 Inhaler, Refills: 0      tiotropium bromide (SPIRIVA RESPIMAT) 2.5 mcg/actuation inhaler Take 1 Puff by inhalation daily.  Qty: 1 Inhaler, Refills: 0    Associated Diagnoses: COPD with acute exacerbation (HCC)      azithromycin (ZITHROMAX) 500 mg tab Take 1 Tab by mouth  daily.  Qty: 4 Tab, Refills: 0      cloNIDine HCl (CATAPRES) 0.3 mg tablet Take bid  Qty: 60 Tab, Refills: 1      fluticasone propion-salmeterol (ADVAIR DISKUS) 100-50 mcg/dose diskus inhaler Take 1 Puff by inhalation two (2) times a day.  Qty: 1 Inhaler, Refills: 0         CONTINUE these medications which have NOT CHANGED    Details   metFORMIN ER (GLUCOPHAGE XR) 750 mg tablet Take 1 Tab by mouth two (2) times a day.  Qty: 30 Tab, Refills: 0      gabapentin (NEURONTIN) 100 mg capsule Take 100 mg by mouth two (2) times a day.      spironolactone (ALDACTONE) 100 mg tablet TAKE 1 TABLET BY MOUTH DAILY  Qty: 90 Tab, Refills: 0    Associated Diagnoses: Severe hypertension      montelukast (SINGULAIR) 10 mg tablet TAKE 1 TABLET BY MOUTH DAILY  Qty: 90 Tab, Refills: 3    Associated Diagnoses: Mild intermittent asthma, unspecified whether  complicated; Chronic rhinitis      nabumetone (RELAFEN) 750 mg tablet TAKE 1 TABLET BY MOUTH TWICE DAILY AS NEEDED FOR PAIN. USE SPARINGLY  Qty: 30 Tab, Refills: 0    Associated Diagnoses: Right hip pain      magnesium oxide (MAG-OX) 400 mg tablet TAKE 1 TABLET BY MOUTH DAILY  Qty: 90 Tab, Refills: 0    Associated Diagnoses: Hypomagnesemia      traZODone (DESYREL) 50 mg tablet Take 1 Tab by mouth nightly as needed for Sleep.  Qty: 20 Tab, Refills: 0      losartan (COZAAR) 100 mg tablet TAKE 1 TABLET BY MOUTH DAILY FOR HYPERTENSION  Qty: 90 Tab, Refills: 0    Associated Diagnoses: Severe hypertension      QUEtiapine SR (SEROQUEL XR) 150 mg sr tablet TAKE 1 TABLET BY MOUTH NIGHTLY  Qty: 90 Tab, Refills: 1    Associated Diagnoses: Medication refill      amLODIPine (NORVASC) 10 mg tablet TAKE ONE TABLET BY MOUTH ONCE DAILY  Qty: 90 Tab, Refills: 1    Associated Diagnoses: Essential hypertension with goal blood pressure less than 140/90      rosuvastatin (CRESTOR) 5 mg tablet TAKE 1 TABLET BY MOUTH NIGHTLY  Qty: 90 Tab, Refills: 1    Associated Diagnoses: Type 2 diabetes mellitus with diabetic neuropathy, without long-term current use of insulin (HCC)      albuterol (ACCUNEB) 1.25 mg/3 mL nebu 3 mL by Nebulization route every six (6) hours as needed.  Qty: 100 Each, Refills: 0      polyethylene glycol (MIRALAX) 17 gram packet Take 1 Packet by mouth daily.  Qty: 100 Packet, Refills: 1      triamcinolone (NASACORT) 55 mcg nasal inhaler 2 Sprays by Both Nostrils route daily.  Qty: 1 Bottle, Refills: 0    Associated Diagnoses: Mild intermittent asthma, unspecified whether complicated; Chronic rhinitis      Blood-Glucose Meter monitoring kit Check sugars 2 times per day, fasting and 2 hours after dinner  Qty: 1 Kit, Refills: 0    Associated Diagnoses: Diabetes mellitus, new onset (HCC)      Lancets misc Check sugars twice daily  Qty: 200 Each, Refills: 2    Associated Diagnoses: Diabetes mellitus, new onset (HCC)       glucose blood VI test strips (ASCENSIA AUTODISC VI, ONE TOUCH ULTRA TEST VI) strip Check sugars twice daily  Qty: 100 Strip, Refills: 5  Associated Diagnoses: Diabetes mellitus, new onset ([place])         STOP taking these medications       cefdinir (OMNICEF) 300 mg capsule Comments:   Reason for Stopping:               Operative Procedures:  None    Consultants:  none    Hospital Course:  Ms Leamer is a 67 year old woman with past history of COPD/asthma who presented to the ED with CO SOB. She had recently been admitted and treated for pneumonia, UTI and metabolic encephalopathy and required intubation during that admission as well as temporary hemodialysis due to acute renal failure then. Over the 2 days PTA she had not been feeling well. She had wheezing on exam that did not improve with routine treatment in the  ED and admission was recommended.     She was admitted and HHN"s as well as IV corticosteroids were provided. Her home medications were reconicled and continued. Glucommander protocols were maintained for DM control.a CTA of the chest was done that is negative for PE. A CT of the head was done that is negative for acute changes. A chronic meningioma is seen that is unchanged. With the above treatments, she showed steady improvement and is currently breathing comfortably on room air. She is DC to home and will continue outpatient FU with her PCP for continued outpatient support.    Physical Exam on Discharge:  Visit Vitals  BP (!) 121/92 (BP Patient Position: Supine)   Pulse (!) 102   Temp 98.2 ??F (36.8 ??C)   Resp 18   Ht '5\' 8"'$  (1.727 m)   Wt 82.6 kg (182 lb)   SpO2 98%   BMI 27.67 kg/m??         Constitutional: well developed, nourished, no distress and alert and oriented x 3   HENT: atraumatic, nose normal, normocephalic, left exterior ear normal, right external ear normal and oropharynx clear and moist   Eyes: conjunctiva normal, EOM normal and PERRL    Neck: ROM normal, supple, trachea normal and cervical adenopathy   Cardiovascular: heart sounds normal, intact distal pulses, normal rate and regular rhythm   Pulmonary/Chest Wall: breath sounds normal and effort normal   Abdominal: appearance normal, bowel sounds normal and soft   Genitourinary/Anorectal: deferred   Musculoskeletal: normal ROM   Neurological: awake, alert and oriented x 3 and gait normal   Skin: dry, intact and warm   Psych:  appropriate             Most Recent BMP and CBC:    Basic Metabolic Profile   Lab Results   Component Value Date/Time    Sodium 136 10/11/2017 05:51 AM    Sodium 136 10/09/2017 07:13 AM    Sodium 134 (L) 10/08/2017 11:23 PM    Potassium 4.7 10/11/2017 05:51 AM    Potassium 4.2 10/09/2017 07:13 AM    Potassium 4.5 10/08/2017 11:23 PM    Chloride 104 10/11/2017 05:51 AM    Chloride 104 10/09/2017 07:13 AM    Chloride 101 10/08/2017 11:23 PM    CO2 24 10/11/2017 05:51 AM    CO2 22 10/09/2017 07:13 AM    CO2 24 10/08/2017 11:23 PM    CO2, TOTAL 22.6 (L) 10/09/2017 11:44 AM    Anion gap 8 10/11/2017 05:51 AM    Anion gap 11 10/09/2017 07:13 AM    Anion gap 10 10/08/2017 11:23 PM    Glucose 296 (H)  10/11/2017 05:51 AM    Glucose 296 (H) 10/09/2017 07:13 AM    Glucose 184 (H) 10/08/2017 11:23 PM    BUN 32 (H) 10/11/2017 05:51 AM    BUN 17 10/09/2017 07:13 AM    BUN 20 10/08/2017 11:23 PM    Creatinine 1.3 10/11/2017 05:51 AM    Creatinine 1.3 10/09/2017 07:13 AM    Creatinine 1.3 10/08/2017 11:23 PM    BUN/Creatinine ratio 10 (L) 09/20/2017 04:05 PM    BUN/Creatinine ratio 24 (H) 06/10/2016 01:00 PM    BUN/Creatinine ratio 19 08/21/2015 01:40 PM    GFR est AA 53.0 10/11/2017 05:51 AM    GFR est AA 53.0 10/09/2017 07:13 AM    GFR est AA 53.0 10/08/2017 11:23 PM    GFR est non-AA 44 10/11/2017 05:51 AM    GFR est non-AA 44 10/09/2017 07:13 AM    GFR est non-AA 44 10/08/2017 11:23 PM    Calcium 9.2 10/11/2017 05:51 AM    Calcium 9.3 10/09/2017 07:13 AM     Calcium 10.0 10/08/2017 11:23 PM           CBC w/Diff    Lab Results   Component Value Date/Time    WBC 10.1 10/11/2017 05:51 AM    WBC 9.1 10/09/2017 07:13 AM    WBC 11.1 (H) 10/08/2017 11:23 PM    Hemoglobin (POC) 11.8 12/16/2014 02:13 PM    HGB 12.0 (L) 10/11/2017 05:51 AM    HGB 12.2 (L) 10/09/2017 07:13 AM    HGB 12.4 (L) 10/08/2017 11:23 PM    HCT 36.3 (L) 10/11/2017 05:51 AM    HCT 36.3 (L) 10/09/2017 07:13 AM    HCT 37.3 10/08/2017 11:23 PM    PLATELET 221 10/11/2017 05:51 AM    PLATELET 257 10/09/2017 07:13 AM    PLATELET 235 10/08/2017 11:23 PM    MCV 89.0 10/11/2017 05:51 AM    MCV 88.1 10/09/2017 07:13 AM    MCV 87.8 10/08/2017 11:23 PM       Recent Labs     10/11/17  0551 10/09/17  0713 10/08/17  2323   WBC 10.1 9.1 11.1*   RBC 4.08 4.12 4.25   HGB 12.0* 12.2* 12.4*   HCT 36.3* 36.3* 37.3   PLT 221 257 235   GRANS 85.8*  --  80.8*   LYMPH 8.3*  --  13.5*   MONOS 4.5  --  4.8   EOS 0.0  --  0.1   BASOS 0.1  --  0.3              Condition at discharge: Afebrile  Ambulating  Eating, Drinking, Voiding  Stable    Disposition:  [facility]    PCP:  Wallene Huh, PA-C

## 2017-10-11 NOTE — Progress Notes (Signed)
Problem: Falls - Risk of  Goal: *Absence of Falls  Description  Document Schmid Fall Risk and appropriate interventions in the flowsheet.  Outcome: Progressing Towards Goal     Problem: Patient Education: Go to Patient Education Activity  Goal: Patient/Family Education  Outcome: Progressing Towards Goal     Problem: Pressure Injury - Risk of  Goal: *Prevention of pressure injury  Description  Document Braden Scale and appropriate interventions in the flowsheet.  Outcome: Progressing Towards Goal     Problem: Patient Education: Go to Patient Education Activity  Goal: Patient/Family Education  Outcome: Progressing Towards Goal     Problem: Gas Exchange - Impaired  Goal: *Absence of hypoxia  Outcome: Progressing Towards Goal     Problem: Patient Education: Go to Patient Education Activity  Goal: Patient/Family Education  Outcome: Progressing Towards Goal

## 2017-10-11 NOTE — Discharge Summary (Signed)
Discharge Summary   Admit Date: 10/08/2017  Discharge Date:  10/11/2017    Patient ID:  [name] [name]  67 y.o.  [birth year]    Chief Complaint   Patient presents with   ??? Shortness of Breath     scatter wheeze though out, resp treatment started in [place].        Patient Active Problem List    Diagnosis Date Noted   ??? SIRS (systemic inflammatory response syndrome) (HCC) 10/09/2017   ??? Hyperkalemia 09/08/2017   ??? Obtunded 09/08/2017   ??? Acute renal failure (ARF) ([place]) 09/08/2017   ??? Septic shock (HCC) 66/11/3014   ??? Metabolic encephalopathy 06/21/3233   ??? Dehydration 08/07/2017   ??? COPD with acute exacerbation ([place]) 08/07/2017   ??? Acute-on-chronic kidney injury ([place]) 08/07/2017   ??? Chest pain 06/15/2017   ??? Dyspnea 06/15/2017   ??? Type 2 diabetes mellitus with diabetic neuropathy ([place]) 12/07/2016   ??? 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 Cannabinoid hyperemesis syndrome ([place]) 08/17/2016   ??? Gastritis 08/16/2016   ??? Nausea & vomiting 08/16/2016   ??? Asthma with acute exacerbation 08/04/2016   ??? Lactic acidosis 07/24/2016   ??? Leukocytosis 07/24/2016   ??? Sepsis ([place]) 07/24/2016   ??? Asthma exacerbation 07/24/2016   ??? Type 2 diabetes mellitus with nephropathy ([place]) 06/12/2016   ??? Sacroiliitis ([place]) 11/25/2015   ??? Essential hypertension 10/06/2015   ??? 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 Lumbar and sacral osteoarthritis 09/15/2015   ??? Chronic pain syndrome 09/15/2015   ??? Type 2 diabetes mellitus with hyperglycemia, without long-term current use of insulin ([place]) 08/20/2015   ??? Acute colitis 07/02/2015   ??? Acute hyperglycemia 07/02/2015   ??? Accelerated hypertension 04/08/2015   ??? Severe headache 04/07/2015   ??? 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 PTSD (post-traumatic stress disorder) 01/29/2015   ??? Severe hypertension 07/21/2014       Discharge Diagnosis:  Acute asthma exacerbation due to bronchitis  HTN  DM II  Bronchitis  Acute on chronic respiratory failure with  hypoxia  GERD  CHronic right hip pain.   Hep C      Current Discharge Medication List      START taking these medications    Details   benzonatate (TESSALON) 100 mg capsule Take 1 Cap by mouth three (3) times daily as needed for Cough for up to 7 days.  Qty: 30 Cap, Refills: 0      predniSONE (DELTASONE) 10 mg tablet Take 10 mg by mouth daily (with breakfast). Take 4 PO daily for 3 days  Then 3 PO daily for 3 days  Then 2 PO daily for 3 days  Then 1 PO daily for 3 days  Then DC  Qty: 30 Tab, Refills: 0         CONTINUE these medications which have CHANGED    Details   albuterol (PROVENTIL HFA, VENTOLIN HFA, PROAIR HFA) 90 mcg/actuation inhaler Take 2 Puffs by inhalation every six (6) hours for 5 days, THEN 2 Puffs every four (4) hours as needed for Wheezing or Shortness of Breath for up to 30 days.  Qty: 1 Inhaler, Refills: 0      tiotropium bromide (SPIRIVA RESPIMAT) 2.5 mcg/actuation inhaler Take 1 Puff by inhalation daily.  Qty: 1 Inhaler, Refills: 0    Associated Diagnoses: COPD with acute exacerbation (HCC)      azithromycin (ZITHROMAX) 500 mg tab Take 1 Tab by mouth  daily.  Qty: 4 Tab, Refills: 0      cloNIDine HCl (CATAPRES) 0.3 mg tablet Take bid  Qty: 60 Tab, Refills: 1      fluticasone propion-salmeterol (ADVAIR DISKUS) 100-50 mcg/dose diskus inhaler Take 1 Puff by inhalation two (2) times a day.  Qty: 1 Inhaler, Refills: 0         CONTINUE these medications which have NOT CHANGED    Details   metFORMIN ER (GLUCOPHAGE XR) 750 mg tablet Take 1 Tab by mouth two (2) times a day.  Qty: 30 Tab, Refills: 0      gabapentin (NEURONTIN) 100 mg capsule Take 100 mg by mouth two (2) times a day.      spironolactone (ALDACTONE) 100 mg tablet TAKE 1 TABLET BY MOUTH DAILY  Qty: 90 Tab, Refills: 0    Associated Diagnoses: Severe hypertension      montelukast (SINGULAIR) 10 mg tablet TAKE 1 TABLET BY MOUTH DAILY  Qty: 90 Tab, Refills: 3    Associated Diagnoses: Mild intermittent asthma, unspecified whether complicated;  Chronic rhinitis      nabumetone (RELAFEN) 750 mg tablet TAKE 1 TABLET BY MOUTH TWICE DAILY AS NEEDED FOR PAIN. USE SPARINGLY  Qty: 30 Tab, Refills: 0    Associated Diagnoses: Right hip pain      magnesium oxide (MAG-OX) 400 mg tablet TAKE 1 TABLET BY MOUTH DAILY  Qty: 90 Tab, Refills: 0    Associated Diagnoses: Hypomagnesemia      traZODone (DESYREL) 50 mg tablet Take 1 Tab by mouth nightly as needed for Sleep.  Qty: 20 Tab, Refills: 0      losartan (COZAAR) 100 mg tablet TAKE 1 TABLET BY MOUTH DAILY FOR HYPERTENSION  Qty: 90 Tab, Refills: 0    Associated Diagnoses: Severe hypertension      QUEtiapine SR (SEROQUEL XR) 150 mg sr tablet TAKE 1 TABLET BY MOUTH NIGHTLY  Qty: 90 Tab, Refills: 1    Associated Diagnoses: Medication refill      amLODIPine (NORVASC) 10 mg tablet TAKE ONE TABLET BY MOUTH ONCE DAILY  Qty: 90 Tab, Refills: 1    Associated Diagnoses: Essential hypertension with goal blood pressure less than 140/90      rosuvastatin (CRESTOR) 5 mg tablet TAKE 1 TABLET BY MOUTH NIGHTLY  Qty: 90 Tab, Refills: 1    Associated Diagnoses: Type 2 diabetes mellitus with diabetic neuropathy, without long-term current use of insulin (HCC)      albuterol (ACCUNEB) 1.25 mg/3 mL nebu 3 mL by Nebulization route every six (6) hours as needed.  Qty: 100 Each, Refills: 0      polyethylene glycol (MIRALAX) 17 gram packet Take 1 Packet by mouth daily.  Qty: 100 Packet, Refills: 1      triamcinolone (NASACORT) 55 mcg nasal inhaler 2 Sprays by Both Nostrils route daily.  Qty: 1 Bottle, Refills: 0    Associated Diagnoses: Mild intermittent asthma, unspecified whether complicated; Chronic rhinitis      Blood-Glucose Meter monitoring kit Check sugars 2 times per day, fasting and 2 hours after dinner  Qty: 1 Kit, Refills: 0    Associated Diagnoses: Diabetes mellitus, new onset (HCC)      Lancets misc Check sugars twice daily  Qty: 200 Each, Refills: 2    Associated Diagnoses: Diabetes mellitus, new onset (HCC)      glucose blood VI  test strips (ASCENSIA AUTODISC VI, ONE TOUCH ULTRA TEST VI) strip Check sugars twice daily  Qty: 100 Strip, Refills: 5  Associated Diagnoses: Diabetes mellitus, new onset ([place])         STOP taking these medications       cefdinir (OMNICEF) 300 mg capsule Comments:   Reason for Stopping:               Operative Procedures:  None    Consultants:  none    Hospital Course:  Ms Bahar is a 67 year old woman with past history of COPD/asthma who presented to the ED with CO SOB. She had recently been admitted and treated for pneumonia, UTI and metabolic encephalopathy and required intubation during that admission as well as temporary hemodialysis due to acute renal failure then. Over the 2 days PTA she had not been feeling well. She had wheezing on exam that did not improve with routine treatment in the  ED and admission was recommended.     She was admitted and HHN"s as well as IV corticosteroids were provided. Her home medications were reconicled and continued. Glucommander protocols were maintained for DM control.a CTA of the chest was done that is negative for PE. A CT of the head was done that is negative for acute changes. A chronic meningioma is seen that is unchanged. With the above treatments, she showed steady improvement and is currently breathing comfortably on room air. She is DC to home and will continue outpatient FU with her PCP for continued outpatient support.      Physical Exam on Discharge:  Visit Vitals  BP (!) 121/92 (BP Patient Position: Supine)   Pulse (!) 102   Temp 98.2 ??F (36.8 ??C)   Resp 18   Ht '5\' 8"'  (1.727 m)   Wt 82.6 kg (182 lb)   SpO2 98%   BMI 27.67 kg/m??         Constitutional: well developed, nourished, no distress and alert and oriented x 3   HENT: atraumatic, nose normal, normocephalic, left exterior ear normal, right external ear normal and oropharynx clear and moist   Eyes: conjunctiva normal, EOM normal and PERRL   Neck: ROM normal, supple, trachea normal and cervical adenopathy    Cardiovascular: heart sounds normal, intact distal pulses, normal rate and regular rhythm   Pulmonary/Chest Wall: breath sounds normal and effort normal   Abdominal: appearance normal, bowel sounds normal and soft   Genitourinary/Anorectal: deferred   Musculoskeletal: normal ROM   Neurological: awake, alert and oriented x 3 and gait normal   Skin: dry, intact and warm   Psych:  appropriate               Most Recent BMP and CBC:    Basic Metabolic Profile   Lab Results   Component Value Date/Time    Sodium 136 10/11/2017 05:51 AM    Sodium 136 10/09/2017 07:13 AM    Sodium 134 (L) 10/08/2017 11:23 PM    Potassium 4.7 10/11/2017 05:51 AM    Potassium 4.2 10/09/2017 07:13 AM    Potassium 4.5 10/08/2017 11:23 PM    Chloride 104 10/11/2017 05:51 AM    Chloride 104 10/09/2017 07:13 AM    Chloride 101 10/08/2017 11:23 PM    CO2 24 10/11/2017 05:51 AM    CO2 22 10/09/2017 07:13 AM    CO2 24 10/08/2017 11:23 PM    CO2, TOTAL 22.6 (L) 10/09/2017 11:44 AM    Anion gap 8 10/11/2017 05:51 AM    Anion gap 11 10/09/2017 07:13 AM    Anion gap 10 10/08/2017 11:23 PM  Glucose 296 (H) 10/11/2017 05:51 AM    Glucose 296 (H) 10/09/2017 07:13 AM    Glucose 184 (H) 10/08/2017 11:23 PM    BUN 32 (H) 10/11/2017 05:51 AM    BUN 17 10/09/2017 07:13 AM    BUN 20 10/08/2017 11:23 PM    Creatinine 1.3 10/11/2017 05:51 AM    Creatinine 1.3 10/09/2017 07:13 AM    Creatinine 1.3 10/08/2017 11:23 PM    BUN/Creatinine ratio 10 (L) 09/20/2017 04:05 PM    BUN/Creatinine ratio 24 (H) 06/10/2016 01:00 PM    BUN/Creatinine ratio 19 08/21/2015 01:40 PM    GFR est AA 53.0 10/11/2017 05:51 AM    GFR est AA 53.0 10/09/2017 07:13 AM    GFR est AA 53.0 10/08/2017 11:23 PM    GFR est non-AA 44 10/11/2017 05:51 AM    GFR est non-AA 44 10/09/2017 07:13 AM    GFR est non-AA 44 10/08/2017 11:23 PM    Calcium 9.2 10/11/2017 05:51 AM    Calcium 9.3 10/09/2017 07:13 AM    Calcium 10.0 10/08/2017 11:23 PM           CBC w/Diff    Lab Results   Component Value  Date/Time    WBC 10.1 10/11/2017 05:51 AM    WBC 9.1 10/09/2017 07:13 AM    WBC 11.1 (H) 10/08/2017 11:23 PM    Hemoglobin (POC) 11.8 12/16/2014 02:13 PM    HGB 12.0 (L) 10/11/2017 05:51 AM    HGB 12.2 (L) 10/09/2017 07:13 AM    HGB 12.4 (L) 10/08/2017 11:23 PM    HCT 36.3 (L) 10/11/2017 05:51 AM    HCT 36.3 (L) 10/09/2017 07:13 AM    HCT 37.3 10/08/2017 11:23 PM    PLATELET 221 10/11/2017 05:51 AM    PLATELET 257 10/09/2017 07:13 AM    PLATELET 235 10/08/2017 11:23 PM    MCV 89.0 10/11/2017 05:51 AM    MCV 88.1 10/09/2017 07:13 AM    MCV 87.8 10/08/2017 11:23 PM       Recent Labs     10/11/17  0551 10/09/17  0713 10/08/17  2323   WBC 10.1 9.1 11.1*   RBC 4.08 4.12 4.25   HGB 12.0* 12.2* 12.4*   HCT 36.3* 36.3* 37.3   PLT 221 257 235   GRANS 85.8*  --  80.8*   LYMPH 8.3*  --  13.5*   MONOS 4.5  --  4.8   EOS 0.0  --  0.1   BASOS 0.1  --  0.3              Condition at discharge: Afebrile  Ambulating  Eating, Drinking, Voiding  Stable    Disposition:  [facility]    PCP:  Wallene Huh, PA-C

## 2017-10-12 NOTE — Progress Notes (Signed)
Hospital Discharge Follow-Up      Date/Time:  10/12/2017 10:46 AM    Patient was admitted to [facility] on 10/08/17 and discharged on 10/11/17 for Acute Asthma exacerbation due to Bronchitis. The physician discharge summary was available at the time of outreach.  Patient was contacted within 1 business days of discharge.      Top Challenges reviewed with the provider   SOB - patient able to carry on telephonic assessment but was increasingly more breathless during call.     Method of communication with provider :chart routing    Inpatient RRAT score: High Risk            34       Total Score        3 Has Seen PCP in Last 6 Months (Yes=3, No=0)    2 Married. Living with Significant Other. Assisted Living. LTAC. SNF. or   Rehab    4 IP Visits Last 12 Months (1-3=4, 4=9, >4=11)    25 Charlson Comorbidity Score (Age + Comorbid Conditions)        Criteria that do not apply:    Patient Length of Stay (>5 days = 3)    Pt. Coverage (Medicare=5 , Medicaid, or Self-Pay=4)      Was this a readmission? yes   Patient stated reason for the readmission: asthma flair up    Nurse Navigator (NN) contacted the patient by telephone to perform post hospital discharge assessment. Verified name and DOB with patient as identifiers. Provided introduction to self, and explanation of the Nurse Navigator role.     Reviewed discharge instructions and red flags with patient who verbalized understanding. Patient given an opportunity to ask questions and does not have any further questions or concerns at this time. The patient agrees to contact the PCP office for questions related to their healthcare. NN provided contact information for future reference.    Disease Specific:   COPD    Summary of patient's top problems:  1. SOB - patient able to carry on telephonic assessment but was increasingly more breathless during call.  2. Patient may need oxygen - patient stated "they kept talking about oxygen but I don't want that"       Home Health orders at discharge: PT, [place]: [facility]  Date of initial visit: 10/12/2017    Barriers to care? no barriers to care    Advance Care Planning:   Does patient have an Advance Directive:  reviewed and current     Medication(s):   New Medications at Discharge:   benzonatate (TESSALON) 100 mg capsule Take 1 Cap by mouth three (3) times daily as needed for Cough for up to 7 days.  Qty: 30 Cap, Refills: 0   ??   predniSONE (DELTASONE) 10 mg tablet Take 10 mg by mouth daily (with breakfast). Take 4 PO daily for 3 days  Then 3 PO daily for 3 days  Then 2 PO daily for 3 days  Then 1 PO daily for 3 days  Then DC  Qty: 30 Tab, Refills: 0     Changed Medications at Discharge:   albuterol (PROVENTIL HFA, VENTOLIN HFA, PROAIR HFA) 90 mcg/actuation inhaler Take 2 Puffs by inhalation every six (6) hours for 5 days, THEN 2 Puffs every four (4) hours as needed for Wheezing or Shortness of Breath for up to 30 days.  Qty: 1 Inhaler, Refills: 0   ??   tiotropium bromide (SPIRIVA  RESPIMAT) 2.5 mcg/actuation inhaler Take 1 Puff by inhalation daily.  Qty: 1 Inhaler, Refills: 0   ?? Associated Diagnoses: COPD with acute exacerbation ([place])   ??   azithromycin (ZITHROMAX) 500 mg tab Take 1 Tab by mouth daily.  Qty: 4 Tab, Refills: 0   ??   cloNIDine HCl (CATAPRES) 0.3 mg tablet Take bid  Qty: 60 Tab, Refills: 1   ??   fluticasone propion-salmeterol (ADVAIR DISKUS) 100-50 mcg/dose diskus inhaler Take 1 Puff by inhalation two (2) times a day.  Qty: 1 Inhaler, Refills: 0   ??     Discontinued Medications at Discharge:    cefdinir (OMNICEF) 300 mg capsule Comments:   Reason for Stopping:      Medication reconciliation was performed with patient, who verbalizes understanding of administration of home medications.  There were no barriers to obtaining medications identified at this time.    Referral to Pharm D needed: no     Current Outpatient Medications   Medication Sig    ??? albuterol (PROVENTIL HFA, VENTOLIN HFA, PROAIR HFA) 90 mcg/actuation inhaler Take 2 Puffs by inhalation every six (6) hours for 5 days, THEN 2 Puffs every four (4) hours as needed for Wheezing or Shortness of Breath for up to 30 days.   ??? tiotropium bromide (SPIRIVA RESPIMAT) 2.5 mcg/actuation inhaler Take 1 Puff by inhalation daily.   ??? azithromycin (ZITHROMAX) 500 mg tab Take 1 Tab by mouth daily.   ??? cloNIDine HCl (CATAPRES) 0.3 mg tablet Take bid   ??? fluticasone propion-salmeterol (ADVAIR DISKUS) 100-50 mcg/dose diskus inhaler Take 1 Puff by inhalation two (2) times a day.   ??? benzonatate (TESSALON) 100 mg capsule Take 1 Cap by mouth three (3) times daily as needed for Cough for up to 7 days.   ??? predniSONE (DELTASONE) 10 mg tablet Take 10 mg by mouth daily (with breakfast). Take 4 PO daily for 3 days  Then 3 PO daily for 3 days  Then 2 PO daily for 3 days  Then 1 PO daily for 3 days  Then DC   ??? metFORMIN ER (GLUCOPHAGE XR) 750 mg tablet Take 1 Tab by mouth two (2) times a day.   ??? gabapentin (NEURONTIN) 100 mg capsule Take 100 mg by mouth two (2) times a day.   ??? spironolactone (ALDACTONE) 100 mg tablet TAKE 1 TABLET BY MOUTH DAILY   ??? montelukast (SINGULAIR) 10 mg tablet TAKE 1 TABLET BY MOUTH DAILY   ??? nabumetone (RELAFEN) 750 mg tablet TAKE 1 TABLET BY MOUTH TWICE DAILY AS NEEDED FOR PAIN. USE SPARINGLY   ??? magnesium oxide (MAG-OX) 400 mg tablet TAKE 1 TABLET BY MOUTH DAILY   ??? traZODone (DESYREL) 50 mg tablet Take 1 Tab by mouth nightly as needed for Sleep.   ??? losartan (COZAAR) 100 mg tablet TAKE 1 TABLET BY MOUTH DAILY FOR HYPERTENSION   ??? QUEtiapine SR (SEROQUEL XR) 150 mg sr tablet TAKE 1 TABLET BY MOUTH NIGHTLY   ??? amLODIPine (NORVASC) 10 mg tablet TAKE ONE TABLET BY MOUTH ONCE DAILY   ??? rosuvastatin (CRESTOR) 5 mg tablet TAKE 1 TABLET BY MOUTH NIGHTLY   ??? albuterol (ACCUNEB) 1.25 mg/3 mL nebu 3 mL by Nebulization route every six (6) hours as needed.    ??? polyethylene glycol (MIRALAX) 17 gram packet Take 1 Packet by mouth daily.   ??? triamcinolone (NASACORT) 55 mcg nasal inhaler 2 Sprays by Both Nostrils route daily.   ??? Blood-Glucose Meter monitoring kit Check sugars 2  times per day, fasting and 2 hours after dinner   ??? Lancets misc Check sugars twice daily   ??? glucose blood VI test strips (ASCENSIA AUTODISC VI, ONE TOUCH ULTRA TEST VI) strip Check sugars twice daily     No current facility-administered medications for this visit.        There are no discontinued medications.    [facility] follow up appointment(s):   Future Appointments   Date Time Provider [place]   10/17/2017 11:30 AM Wallene Huh, PA-C [place]      Non-[facility] follow up appointment(s): none  [facility]:  out of service area       Goals     ??? Attends follow-up appointments as directed.     ??? Prevent complications post hospitalization.      Patient will attend all doctors appointments as scheduled  Nurse Navigator will contact patient weekly for at least 4 weeks after discharge      ??? Understands red flags post discharge.      ??? Confusion  ??? Fast breathing  ??? Chills or shaking  ??? Fever or low body temperature  ??? Fast heart beat  ??? Lightheadedness  ??? Less urine output  ??? Skin rash or skin color changes    09/15/2017 mailed sepsis letter to patient

## 2017-10-13 LAB — CULTURE, URINE
ISOLATE: <10000 — AB
Isolate: 50000 — AB
Isolate: <10000 — AB

## 2017-10-14 LAB — CULTURE, BLOOD
Blood Culture Result: NO GROWTH
Blood Culture Result: NO GROWTH

## 2017-10-17 ENCOUNTER — Encounter: Attending: Physician Assistant | Primary: Physician Assistant

## 2017-10-20 NOTE — Progress Notes (Signed)
Follow up Dx:  [facility] 4/28-10/11/17 for acute asthma exacerbation due to bronchitis    Nurse Navigator(NN) contacted the patient by telephone to perform follow up assessment.  Verified DOB and address with patient as identifiers.      Medication:   Patient states that husband manages her medications and he is not home "right now" to do medication reconciliation.  Instructions : NN instructed patient on importance of scheduling and attending physician appointments.       PCP/Specialist follow up:  Did not attend follow up appointment with PCP on 10/17/17.    Patient states she remains "a little bit" short of breath. States that she "might" accept oxygen now. NN discussed the advantages of oxygen and signs of oxygen deprivation.    Patient complaining of pain in right hip "where I had my hip replacement done a couple of years ago". Denies any recent falls. States she will call ortho and schedule appointment to have someone check on her hip.     Reviewed red flags with patient, and patient verbalizes understanding.  Patient given an opportunity to ask questions. No other clinical/social/functional needs noted.   The patient agrees to contact the PCP office for questions related to their healthcare.

## 2017-10-24 ENCOUNTER — Telehealth

## 2017-10-24 NOTE — Telephone Encounter (Signed)
Rose with [facility] called stating that she had a home visit with Mrs. Perl and that she complained of right sided pain radiating from her armpit to her hip, a blood sugar of 289 after only eating breakfast and that her glucose test strips have expired. Request for test strips had been entered by [facility] and Rose is requesting a call back.

## 2017-10-26 NOTE — Progress Notes (Signed)
Hospital Discharge Follow-Up      Date/Time:  10/26/2017 10:53 AM    Patient was admitted to [facility] on 10/08/17 and discharged on 10/11/17 for Acute Asthma exacerbation due to Bronchitis. The physician discharge summary was available at the time of outreach. Nurse Navigator attempted to contact patient.   Message left introducing myself, the purpose of the call and giving my contact information.  Requested that patient call back at her earliest convenience    Current Outpatient Medications   Medication Sig   ??? albuterol (PROVENTIL HFA, VENTOLIN HFA, PROAIR HFA) 90 mcg/actuation inhaler Take 2 Puffs by inhalation every six (6) hours for 5 days, THEN 2 Puffs every four (4) hours as needed for Wheezing or Shortness of Breath for up to 30 days.   ??? tiotropium bromide (SPIRIVA RESPIMAT) 2.5 mcg/actuation inhaler Take 1 Puff by inhalation daily.   ??? azithromycin (ZITHROMAX) 500 mg tab Take 1 Tab by mouth daily.   ??? cloNIDine HCl (CATAPRES) 0.3 mg tablet Take bid   ??? fluticasone propion-salmeterol (ADVAIR DISKUS) 100-50 mcg/dose diskus inhaler Take 1 Puff by inhalation two (2) times a day.   ??? predniSONE (DELTASONE) 10 mg tablet Take 10 mg by mouth daily (with breakfast). Take 4 PO daily for 3 days  Then 3 PO daily for 3 days  Then 2 PO daily for 3 days  Then 1 PO daily for 3 days  Then DC   ??? metFORMIN ER (GLUCOPHAGE XR) 750 mg tablet Take 1 Tab by mouth two (2) times a day.   ??? gabapentin (NEURONTIN) 100 mg capsule Take 100 mg by mouth two (2) times a day.   ??? spironolactone (ALDACTONE) 100 mg tablet TAKE 1 TABLET BY MOUTH DAILY   ??? montelukast (SINGULAIR) 10 mg tablet TAKE 1 TABLET BY MOUTH DAILY   ??? nabumetone (RELAFEN) 750 mg tablet TAKE 1 TABLET BY MOUTH TWICE DAILY AS NEEDED FOR PAIN. USE SPARINGLY   ??? magnesium oxide (MAG-OX) 400 mg tablet TAKE 1 TABLET BY MOUTH DAILY   ??? traZODone (DESYREL) 50 mg tablet Take 1 Tab by mouth nightly as needed for Sleep.    ??? losartan (COZAAR) 100 mg tablet TAKE 1 TABLET BY MOUTH DAILY FOR HYPERTENSION   ??? QUEtiapine SR (SEROQUEL XR) 150 mg sr tablet TAKE 1 TABLET BY MOUTH NIGHTLY   ??? amLODIPine (NORVASC) 10 mg tablet TAKE ONE TABLET BY MOUTH ONCE DAILY   ??? rosuvastatin (CRESTOR) 5 mg tablet TAKE 1 TABLET BY MOUTH NIGHTLY   ??? albuterol (ACCUNEB) 1.25 mg/3 mL nebu 3 mL by Nebulization route every six (6) hours as needed.   ??? polyethylene glycol (MIRALAX) 17 gram packet Take 1 Packet by mouth daily.   ??? triamcinolone (NASACORT) 55 mcg nasal inhaler 2 Sprays by Both Nostrils route daily.   ??? Blood-Glucose Meter monitoring kit Check sugars 2 times per day, fasting and 2 hours after dinner   ??? Lancets misc Check sugars twice daily   ??? glucose blood VI test strips (ASCENSIA AUTODISC VI, ONE TOUCH ULTRA TEST VI) strip Check sugars twice daily     No current facility-administered medications for this visit.        There are no discontinued medications.    [facility] follow up appointment(s): No future appointments.       Goals     ??? Attends follow-up appointments as directed.     ??? Prevent complications post hospitalization.      Patient will attend all doctors appointments  as scheduled  Nurse Navigator will contact patient weekly for at least 4 weeks after discharge    10/20/17  Patient did not attend PCP appointment on 10/17/17. Has not yet rescheduled appointment      ??? Understands red flags post discharge.      ??? Confusion  ??? Fast breathing  ??? Chills or shaking  ??? Fever or low body temperature  ??? Fast heart beat  ??? Lightheadedness  ??? Less urine output  ??? Skin rash or skin color changes    09/15/2017 mailed sepsis letter to patient

## 2017-10-31 NOTE — Progress Notes (Signed)
Hospital Discharge Follow-Up      Date/Time:  10/31/2017 3:47 PM    Patient was admitted to [facility] on 10/08/17 and discharged on 10/11/17 for Acute Asthma exacerbation due to Bronchitis. The physician discharge summary was available at the time of outreach. Nurse Navigator attempted to contact patient.   Message left introducing myself, the purpose of the call and giving my contact information.  Requested that patient call back at her earliest convenience.  Call placed to cell number on file but there was no answer.    Current Outpatient Medications   Medication Sig   ??? albuterol (PROVENTIL HFA, VENTOLIN HFA, PROAIR HFA) 90 mcg/actuation inhaler Take 2 Puffs by inhalation every six (6) hours for 5 days, THEN 2 Puffs every four (4) hours as needed for Wheezing or Shortness of Breath for up to 30 days.   ??? tiotropium bromide (SPIRIVA RESPIMAT) 2.5 mcg/actuation inhaler Take 1 Puff by inhalation daily.   ??? azithromycin (ZITHROMAX) 500 mg tab Take 1 Tab by mouth daily.   ??? cloNIDine HCl (CATAPRES) 0.3 mg tablet Take bid   ??? fluticasone propion-salmeterol (ADVAIR DISKUS) 100-50 mcg/dose diskus inhaler Take 1 Puff by inhalation two (2) times a day.   ??? predniSONE (DELTASONE) 10 mg tablet Take 10 mg by mouth daily (with breakfast). Take 4 PO daily for 3 days  Then 3 PO daily for 3 days  Then 2 PO daily for 3 days  Then 1 PO daily for 3 days  Then DC   ??? metFORMIN ER (GLUCOPHAGE XR) 750 mg tablet Take 1 Tab by mouth two (2) times a day.   ??? gabapentin (NEURONTIN) 100 mg capsule Take 100 mg by mouth two (2) times a day.   ??? spironolactone (ALDACTONE) 100 mg tablet TAKE 1 TABLET BY MOUTH DAILY   ??? montelukast (SINGULAIR) 10 mg tablet TAKE 1 TABLET BY MOUTH DAILY   ??? nabumetone (RELAFEN) 750 mg tablet TAKE 1 TABLET BY MOUTH TWICE DAILY AS NEEDED FOR PAIN. USE SPARINGLY   ??? magnesium oxide (MAG-OX) 400 mg tablet TAKE 1 TABLET BY MOUTH DAILY    ??? traZODone (DESYREL) 50 mg tablet Take 1 Tab by mouth nightly as needed for Sleep.   ??? losartan (COZAAR) 100 mg tablet TAKE 1 TABLET BY MOUTH DAILY FOR HYPERTENSION   ??? QUEtiapine SR (SEROQUEL XR) 150 mg sr tablet TAKE 1 TABLET BY MOUTH NIGHTLY   ??? amLODIPine (NORVASC) 10 mg tablet TAKE ONE TABLET BY MOUTH ONCE DAILY   ??? rosuvastatin (CRESTOR) 5 mg tablet TAKE 1 TABLET BY MOUTH NIGHTLY   ??? albuterol (ACCUNEB) 1.25 mg/3 mL nebu 3 mL by Nebulization route every six (6) hours as needed.   ??? polyethylene glycol (MIRALAX) 17 gram packet Take 1 Packet by mouth daily.   ??? triamcinolone (NASACORT) 55 mcg nasal inhaler 2 Sprays by Both Nostrils route daily.   ??? Blood-Glucose Meter monitoring kit Check sugars 2 times per day, fasting and 2 hours after dinner   ??? Lancets misc Check sugars twice daily   ??? glucose blood VI test strips (ASCENSIA AUTODISC VI, ONE TOUCH ULTRA TEST VI) strip Check sugars twice daily     No current facility-administered medications for this visit.        There are no discontinued medications.    [facility] follow up appointment(s): No future appointments.       Goals     ??? Attends follow-up appointments as directed.     ??? Prevent complications  post hospitalization.      Patient will attend all doctors appointments as scheduled  Nurse Navigator will contact patient weekly for at least 4 weeks after discharge    10/20/17  Patient did not attend PCP appointment on 10/17/17. Has not yet rescheduled appointment      ??? Understands red flags post discharge.      ??? Confusion  ??? Fast breathing  ??? Chills or shaking  ??? Fever or low body temperature  ??? Fast heart beat  ??? Lightheadedness  ??? Less urine output  ??? Skin rash or skin color changes    09/15/2017 mailed sepsis letter to patient

## 2017-10-31 NOTE — Progress Notes (Signed)
Hospital Discharge Follow-Up      Date/Time:  10/31/2017 3:47 PM    Patient was admitted to [facility] on 10/08/17 and discharged on 10/11/17 for Acute Asthma exacerbation due to Bronchitis. The physician discharge summary was available at the time of outreach. Nurse Navigator attempted to contact patient.   Message left introducing myself, the purpose of the call and giving my contact information.  Requested that patient call back at her earliest convenience.  Call placed to cell number on file but there was no answer.    Current Outpatient Medications   Medication Sig   . albuterol (PROVENTIL HFA, VENTOLIN HFA, PROAIR HFA) 90 mcg/actuation inhaler Take 2 Puffs by inhalation every six (6) hours for 5 days, THEN 2 Puffs every four (4) hours as needed for Wheezing or Shortness of Breath for up to 30 days.   Marland Kitchen tiotropium bromide (SPIRIVA RESPIMAT) 2.5 mcg/actuation inhaler Take 1 Puff by inhalation daily.   Marland Kitchen azithromycin (ZITHROMAX) 500 mg tab Take 1 Tab by mouth daily.   . cloNIDine HCl (CATAPRES) 0.3 mg tablet Take bid   . fluticasone propion-salmeterol (ADVAIR DISKUS) 100-50 mcg/dose diskus inhaler Take 1 Puff by inhalation two (2) times a day.   . predniSONE (DELTASONE) 10 mg tablet Take 10 mg by mouth daily (with breakfast). Take 4 PO daily for 3 days  Then 3 PO daily for 3 days  Then 2 PO daily for 3 days  Then 1 PO daily for 3 days  Then DC   . metFORMIN ER (GLUCOPHAGE XR) 750 mg tablet Take 1 Tab by mouth two (2) times a day.   . gabapentin (NEURONTIN) 100 mg capsule Take 100 mg by mouth two (2) times a day.   . spironolactone (ALDACTONE) 100 mg tablet TAKE 1 TABLET BY MOUTH DAILY   . montelukast (SINGULAIR) 10 mg tablet TAKE 1 TABLET BY MOUTH DAILY   . nabumetone (RELAFEN) 750 mg tablet TAKE 1 TABLET BY MOUTH TWICE DAILY AS NEEDED FOR PAIN. USE SPARINGLY   . magnesium oxide (MAG-OX) 400 mg tablet TAKE 1 TABLET BY MOUTH DAILY   . traZODone (DESYREL) 50 mg tablet Take 1 Tab by mouth nightly  as needed for Sleep.   Marland Kitchen losartan (COZAAR) 100 mg tablet TAKE 1 TABLET BY MOUTH DAILY FOR HYPERTENSION   . QUEtiapine SR (SEROQUEL XR) 150 mg sr tablet TAKE 1 TABLET BY MOUTH NIGHTLY   . amLODIPine (NORVASC) 10 mg tablet TAKE ONE TABLET BY MOUTH ONCE DAILY   . rosuvastatin (CRESTOR) 5 mg tablet TAKE 1 TABLET BY MOUTH NIGHTLY   . albuterol (ACCUNEB) 1.25 mg/3 mL nebu 3 mL by Nebulization route every six (6) hours as needed.   . polyethylene glycol (MIRALAX) 17 gram packet Take 1 Packet by mouth daily.   Marland Kitchen triamcinolone (NASACORT) 55 mcg nasal inhaler 2 Sprays by Both Nostrils route daily.   . Blood-Glucose Meter monitoring kit Check sugars 2 times per day, fasting and 2 hours after dinner   . Lancets misc Check sugars twice daily   . glucose blood VI test strips (ASCENSIA AUTODISC VI, ONE TOUCH ULTRA TEST VI) strip Check sugars twice daily     No current facility-administered medications for this visit.        There are no discontinued medications.    [facility] follow up appointment(s): No future appointments.       Goals     . Attends follow-up appointments as directed.     . Prevent complications  post hospitalization.      Patient will attend all doctors appointments as scheduled  Nurse Navigator will contact patient weekly for at least 4 weeks after discharge    10/20/17  Patient did not attend PCP appointment on 10/17/17. Has not yet rescheduled appointment      . Understands red flags post discharge.      . Confusion  . Fast breathing  . Chills or shaking  . Fever or low body temperature  . Fast heart beat  . Lightheadedness  . Less urine output  . Skin rash or skin color changes    09/15/2017 mailed sepsis letter to patient

## 2017-11-02 ENCOUNTER — Ambulatory Visit: Attending: Physician Assistant | Primary: Physician Assistant

## 2017-11-02 ENCOUNTER — Inpatient Hospital Stay: Admit: 2017-11-03 | Payer: MEDICARE | Primary: Physician Assistant

## 2017-11-02 ENCOUNTER — Encounter: Attending: Physician Assistant | Primary: Physician Assistant

## 2017-11-02 ENCOUNTER — Ambulatory Visit
Admit: 2017-11-02 | Discharge: 2017-11-02 | Payer: MEDICARE | Attending: Physician Assistant | Primary: Physician Assistant

## 2017-11-02 DIAGNOSIS — J441 Chronic obstructive pulmonary disease with (acute) exacerbation: Secondary | ICD-10-CM

## 2017-11-02 DIAGNOSIS — N3000 Acute cystitis without hematuria: Secondary | ICD-10-CM

## 2017-11-02 LAB — AMB POC URINALYSIS DIP STICK AUTO W/O MICRO
Bilirubin (UA POC): NEGATIVE
Bilirubin, Urine, POC: NEGATIVE
Nitrite, Urine, POC: POSITIVE
Nitrites (UA POC): POSITIVE
Specific Gravity, Urine, POC: 1.015 NA (ref 1.001–1.035)
Specific gravity (UA POC): 1.015 (ref 1.001–1.035)
Urobilinogen (UA POC): 0.2 (ref 0.2–1)
Urobilinogen, POC: 0.2 (ref 0.2–1)
pH (UA POC): 6.5 (ref 4.6–8.0)
pH, Urine, POC: 6.5 NA (ref 4.6–8.0)

## 2017-11-02 LAB — AMB POC HEMOGLOBIN A1C
Hemoglobin A1C, POC: 9 %
Hemoglobin A1c (POC): 9 %

## 2017-11-02 MED ORDER — KETOROLAC TROMETHAMINE 60 MG/2 ML IM
60 mg/2 mL | Freq: Once | INTRAMUSCULAR | 0 refills | Status: AC
Start: 2017-11-02 — End: 2017-11-02

## 2017-11-02 MED ORDER — NITROFURANTOIN (25% MACROCRYSTAL FORM) 100 MG CAP
100 mg | ORAL_CAPSULE | Freq: Two times a day (BID) | ORAL | 0 refills | Status: AC
Start: 2017-11-02 — End: 2017-11-07

## 2017-11-02 NOTE — Progress Notes (Signed)
Urine culture shows E coli. Macrobid is susceptible so this should clear up her infection. I want her to come back one week after completion of her abx to leave a sample and I will send out for culture to ensure infection has cleared.

## 2017-11-02 NOTE — Progress Notes (Signed)
HISTORY OF PRESENT ILLNESS  [name] [name] is a 67 y.o. female.  HPI  [name] [name] is a 67 y.o. female who presents to the office today for hospital follow up  She comes in with her husband for hospital follow up.   She was admitted to [facility] for acute asthma exacerbation from 10/08/17-10/11/17.  She was treated with IV corticosteroids and neb treatments. A CTA of the chest was done and was negative for PE. A CT of the head was done and negative for any acute abnormalities. Respiratory status has been stable since discharge.   Her husband says urine has an odor again. She denies fever, abdominal pain, low back pain.   There is also concern about her memory. They report that in the hospital she was told she likely has dementia. I have only been seeing [name] [name] for a short time since her previous PCP left the practice. I feel that she is repeating herself at this visit.     Chief Complaint   Patient presents with   ??? Hospital Follow Up   Current Outpatient Medications on File Prior to Visit   Medication Sig Dispense Refill   ??? tiotropium bromide (SPIRIVA RESPIMAT) 2.5 mcg/actuation inhaler Take 1 Puff by inhalation daily. 1 Inhaler 0   ??? cloNIDine HCl (CATAPRES) 0.3 mg tablet Take bid (Patient taking differently: Take 1 Tab by mouth two (2) times a day. Take bid) 60 Tab 1   ??? fluticasone propion-salmeterol (ADVAIR DISKUS) 100-50 mcg/dose diskus inhaler Take 1 Puff by inhalation two (2) times a day. 1 Inhaler 0   ??? metFORMIN ER (GLUCOPHAGE XR) 750 mg tablet Take 1 Tab by mouth two (2) times a day. 30 Tab 0   ??? gabapentin (NEURONTIN) 100 mg capsule Take 600 mg by mouth three (3) times daily.     ??? spironolactone (ALDACTONE) 100 mg tablet TAKE 1 TABLET BY MOUTH DAILY 90 Tab 0   ??? montelukast (SINGULAIR) 10 mg tablet TAKE 1 TABLET BY MOUTH DAILY 90 Tab 3   ??? magnesium oxide (MAG-OX) 400 mg tablet TAKE 1 TABLET BY MOUTH DAILY 90 Tab 0   ??? traZODone (DESYREL) 50 mg tablet Take 1 Tab by mouth nightly as needed  for Sleep. 20 Tab 0   ??? losartan (COZAAR) 100 mg tablet TAKE 1 TABLET BY MOUTH DAILY FOR HYPERTENSION 90 Tab 0   ??? QUEtiapine SR (SEROQUEL XR) 150 mg sr tablet TAKE 1 TABLET BY MOUTH NIGHTLY 90 Tab 1   ??? rosuvastatin (CRESTOR) 5 mg tablet TAKE 1 TABLET BY MOUTH NIGHTLY 90 Tab 1   ??? albuterol (ACCUNEB) 1.25 mg/3 mL nebu 3 mL by Nebulization route every six (6) hours as needed. 100 Each 0   ??? polyethylene glycol (MIRALAX) 17 gram packet Take 1 Packet by mouth daily. 100 Packet 1   ??? triamcinolone (NASACORT) 55 mcg nasal inhaler 2 Sprays by Both Nostrils route daily. 1 Bottle 0   ??? Blood-Glucose Meter monitoring kit Check sugars 2 times per day, fasting and 2 hours after dinner 1 Kit 0   ??? Lancets misc Check sugars twice daily 200 Each 2   ??? glucose blood VI test strips (ASCENSIA AUTODISC VI, ONE TOUCH ULTRA TEST VI) strip Check sugars twice daily 100 Strip 5     No current facility-administered medications on file prior to visit.    Allergies   Allergen Reactions   ??? Chocolate [Cocoa] Sneezing   Past Medical History:   Diagnosis Date   ???  Arthritis    ??? 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 COPD (chronic obstructive pulmonary disease) ([place])    ??? DM2 (diabetes mellitus, type 2) ([place])    ??? GERD (gastroesophageal reflux disease)    ??? Hepatitis C     HEP C   ??? HTN (hypertension)    ??? Lung nodule, multiple     (CT 10/2016) Small left upper lobe and 1.7 x 1.6 cm left lower lobe nodules not significantly changed from 07/23/2016 and 03/22/2016   ??? Marijuana use    ??? PTSD (post-traumatic stress disorder)     lived through the [place] in 1993   ??? Thoracic ascending aortic aneurysm ([place])     4.4cm noted on CT Chest (10/2016)   ??? Thyroid nodule     2.5 cm stable right thyroid nodule (CT 10/2016)   Social History     Tobacco Use   Smoking Status Former Smoker   Smokeless Tobacco Never Used   Tobacco Comment    reported as quit smoking around 1990s per spouse   Social History      Substance and Sexual Activity   Alcohol Use No   Family History   Problem Relation Age of Onset   ??? Hypertension Mother    ??? Other Mother         OSA   ??? Cancer Father         suspected leukemia per pt's spouse   ??? Cancer Maternal Aunt    ??? Alcohol abuse Maternal Grandfather      Review of Systems   Constitutional: Positive for malaise/fatigue. Negative for fever and weight loss.   Eyes: Negative for blurred vision.   Respiratory: Negative for cough, sputum production, shortness of breath and wheezing.    Cardiovascular: Negative for chest pain and palpitations.   Gastrointestinal: Negative for diarrhea, nausea and vomiting.   Genitourinary: Negative for dysuria, flank pain, frequency and hematuria.        Foul smelling urine- per husband   Musculoskeletal: Positive for back pain and joint pain.        Right hip pain with radiation down right leg   Neurological: Positive for weakness. Negative for dizziness.        Generalized weakness   Psychiatric/Behavioral: Positive for memory loss.     Visit Vitals  Pulse 97   Temp 98.7 ??F (37.1 ??C) (Oral)   Resp 19   Ht 5' 8" (1.727 m)   Wt 189 lb (85.7 kg)   SpO2 91%   BMI 28.74 kg/m??     Physical Exam   Constitutional: She is oriented to person, place, and time. She appears well-developed and well-nourished. No distress.   Cardiovascular: Normal rate, regular rhythm and normal heart sounds.   Pulses:       Radial pulses are 2+ on the right side, and 2+ on the left side.   Pulmonary/Chest: Effort normal and breath sounds normal. No respiratory distress. She has no wheezes. She has no rales.   Musculoskeletal: She exhibits no edema.   Sitting in wheelchair   Neurological: She is alert and oriented to person, place, and time.   Repeating herself to myself and to nurse   Skin: Skin is warm and dry.   Psychiatric: She has a normal mood and affect. Her behavior is normal.   Nursing note and vitals reviewed.    Results for orders placed or performed in  visit on 11/02/17    AMB POC URINALYSIS DIP STICK AUTO W/O MICRO     Status: None   Result Value Ref Range Status    Color (UA POC) Yellow  Final    Clarity (UA POC) Cloudy  Final    Glucose (UA POC) 2+ Negative Final    Bilirubin (UA POC) Negative Negative Final    Ketones (UA POC) Trace Negative Final    Specific gravity (UA POC) 1.015 1.001 - 1.035 Final    Blood (UA POC) 2+ Negative Final    pH (UA POC) 6.5 4.6 - 8.0 Final    Protein (UA POC) Trace Negative Final    Urobilinogen (UA POC) 0.2 mg/dL 0.2 - 1 Final    Nitrites (UA POC) Positive Negative Final    Leukocyte esterase (UA POC) 2+ Negative Final     Lab Results   Component Value Date/Time    Hemoglobin A1c 9.5 (H) 08/07/2017 03:25 AM    Hemoglobin A1c (POC) 9.0 11/02/2017 02:44 PM    Hemoglobin A1c, External 6.6 01/08/2014     ASSESSMENT and PLAN    ICD-10-CM ICD-9-CM    1. COPD with acute exacerbation ([place]) J44.1 491.21    2. Right hip pain M25.551 719.45 ketorolac tromethamine (TORADOL) 60 mg/2 mL soln   3. Right arm pain M79.601 729.5 ketorolac tromethamine (TORADOL) 60 mg/2 mL soln      REFERRAL TO HOME HEALTH   4. Physical deconditioning R53.81 799.3 REFERRAL TO [place]   5. Chronic bronchitis, unspecified chronic bronchitis type ([place]) J42 491.9    6. Abnormal urine odor R82.90 791.9 AMB POC URINALYSIS DIP STICK AUTO W/O MICRO   7. 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 Memory changes R41.3 780.93 REFERRAL TO NEUROLOGY   9. Acute cystitis without hematuria N30.00 595.0 nitrofurantoin, macrocrystal-monohydrate, (MACROBID) 100 mg capsule      CULTURE, URINE      COPD exacerbation- better since hospital discharge. She has already been referred to [facility].   Chronic right sided hip and arm pain- IM toradol in the office. Will not treat with narcotics.   I placed an order for [facility] due to physical deconditioning and frequent hospitalizations.    Treating for UTI with macrobid. Sending urine out for culture as well.   I am concerned about her memory changes- will have eval by Neuro.   Reviewed medication and side effects. Patient agrees with the plan and verbalizes understanding.     Wallene Huh, PA-C  11/02/2017

## 2017-11-02 NOTE — Progress Notes (Signed)
Shahira R Mclane is a 67 y.o. female here for hospital follow up

## 2017-11-02 NOTE — Progress Notes (Signed)
Talked with patients husband, confirmed he was on her release of information. Informed of results, no other questions or concerns.

## 2017-11-02 NOTE — Patient Instructions (Signed)
COPD Exacerbation Plan: Care Instructions  Your Care Instructions    If you have chronic obstructive pulmonary disease (COPD), your usual shortness of breath could suddenly get worse. You may start coughing more and have more mucus. This flare-up is called a COPD exacerbation (say "[id number]").  A lung infection or air pollution could set off an exacerbation. Sometimes it can happen after a quick change in temperature or being around chemicals.  Work with your doctor to make a plan for dealing with an exacerbation. You can better manage it if you plan ahead.  Follow-up care is a key part of your treatment and safety. Be sure to make and go to all appointments, and call your doctor if you are having problems. It's also a good idea to know your test results and keep a list of the medicines you take.  How can you care for yourself at home?  During an exacerbation  ?? Do not panic if you start to have one. Quick treatment at home may help you prevent serious breathing problems. If you have a COPD exacerbation plan that you developed with your doctor, follow it.  ?? Take your medicines exactly as your doctor tells you.  ? Use your inhaler as directed by your doctor. If your symptoms do not get better after you use your medicine, have someone take you to the emergency room. Call an ambulance if necessary.  ? With inhaled medicines, a spacer or a nebulizer may help you get more medicine to your lungs. Ask your doctor or pharmacist how to use them properly. Practice using the spacer in front of a mirror before you have an exacerbation. This may help you get the medicine into your lungs quickly.  ? If your doctor has given you steroid pills, take them as directed.  ? Your doctor may have given you a prescription for antibiotics, which you can fill if you need it.  ? Talk to your doctor if you have any problems with your medicine. And call your doctor if you have to use your antibiotic or steroid pills.   Preventing an exacerbation  ?? Do not smoke. This is the most important step you can take to prevent more damage to your lungs and prevent problems. If you already smoke, it is never too late to stop. If you need help quitting, talk to your doctor about stop-smoking programs and medicines. These can increase your chances of quitting for good.  ?? Take your daily medicines as prescribed.  ?? Avoid colds and flu.  ? Get a pneumococcal vaccine.  ? Get a flu vaccine each year, as soon as it is available. Ask those you live or work with to do the same, so they will not get the flu and infect you.  ? Try to stay away from people with colds or the flu.  ? Wash your hands often.  ?? Avoid secondhand smoke; air pollution; cold, dry air; hot, humid air; and high altitudes. Stay at home with your windows closed when air pollution is bad.  ?? Learn breathing techniques for COPD, such as breathing through pursed lips. These techniques can help you breathe easier during an exacerbation.  When should you call for help?  Call 911 anytime you think you may need emergency care. For example, call if:  ?? ?? You have severe trouble breathing.   ?? ?? You have severe chest pain.   ??Call your doctor now or seek immediate medical care if:  ?? ?? You   have new or worse shortness of breath.   ?? ?? You develop new chest pain.   ?? ?? You are coughing more deeply or more often, especially if you notice more mucus or a change in the color of your mucus.   ?? ?? You cough up blood.   ?? ?? You have new or increased swelling in your legs or belly.   ?? ?? You have a fever.   ??Watch closely for changes in your health, and be sure to contact your doctor if:  ?? ?? You need to use your antibiotic or steroid pills.   ?? ?? Your symptoms are getting worse.   Where can you learn more?  Go to http://www.healthwise.net/[employer]Help[employer]ctions.  Enter U536 in the search box to learn more about "COPD Exacerbation Plan: Care Instructions."  Current as of: February 15, 2017   Content Version: 11.9  ?? 2006-2018 Healthwise[employer]. Care instructions adapted under license by [employer] (which disclaims liability or warranty for this information). If you have questions about a medical condition or this instruction, always ask your healthcare professional. Healthwise, Incorporated disclaims any warranty or liability for your use of this information.

## 2017-11-02 NOTE — Progress Notes (Signed)
Talked with patients husband, confirmed he was on her release of information. Informed of results, no other questions or concerns.

## 2017-11-02 NOTE — Progress Notes (Signed)
HISTORY OF PRESENT ILLNESS  [name] [name] is a 67 y.o. female.  HPI  [name] [name] is a 67 y.o. female who presents to the office today for hospital follow up  She comes in with her husband for hospital follow up.   She was admitted to [facility] for acute asthma exacerbation from 10/08/17-10/11/17.  She was treated with IV corticosteroids and neb treatments. A CTA of the chest was done and was negative for PE. A CT of the head was done and negative for any acute abnormalities. Respiratory status has been stable since discharge.   Her husband says urine has an odor again. She denies fever, abdominal pain, low back pain.   There is also concern about her memory. They report that in the hospital she was told she likely has dementia. I have only been seeing [name] for a short time since her previous PCP left the practice. I feel that she is repeating herself at this visit.     Chief Complaint   Patient presents with   ??? Hospital Follow Up       Current Outpatient Medications on File Prior to Visit   Medication Sig Dispense Refill   ??? tiotropium bromide (SPIRIVA RESPIMAT) 2.5 mcg/actuation inhaler Take 1 Puff by inhalation daily. 1 Inhaler 0   ??? cloNIDine HCl (CATAPRES) 0.3 mg tablet Take bid (Patient taking differently: Take 1 Tab by mouth two (2) times a day. Take bid) 60 Tab 1   ??? fluticasone propion-salmeterol (ADVAIR DISKUS) 100-50 mcg/dose diskus inhaler Take 1 Puff by inhalation two (2) times a day. 1 Inhaler 0   ??? metFORMIN ER (GLUCOPHAGE XR) 750 mg tablet Take 1 Tab by mouth two (2) times a day. 30 Tab 0   ??? gabapentin (NEURONTIN) 100 mg capsule Take 600 mg by mouth three (3) times daily.     ??? spironolactone (ALDACTONE) 100 mg tablet TAKE 1 TABLET BY MOUTH DAILY 90 Tab 0   ??? montelukast (SINGULAIR) 10 mg tablet TAKE 1 TABLET BY MOUTH DAILY 90 Tab 3   ??? magnesium oxide (MAG-OX) 400 mg tablet TAKE 1 TABLET BY MOUTH DAILY 90 Tab 0   ??? traZODone (DESYREL) 50 mg tablet Take 1 Tab by mouth nightly as needed for Sleep.  20 Tab 0   ??? losartan (COZAAR) 100 mg tablet TAKE 1 TABLET BY MOUTH DAILY FOR HYPERTENSION 90 Tab 0   ??? QUEtiapine SR (SEROQUEL XR) 150 mg sr tablet TAKE 1 TABLET BY MOUTH NIGHTLY 90 Tab 1   ??? rosuvastatin (CRESTOR) 5 mg tablet TAKE 1 TABLET BY MOUTH NIGHTLY 90 Tab 1   ??? albuterol (ACCUNEB) 1.25 mg/3 mL nebu 3 mL by Nebulization route every six (6) hours as needed. 100 Each 0   ??? polyethylene glycol (MIRALAX) 17 gram packet Take 1 Packet by mouth daily. 100 Packet 1   ??? triamcinolone (NASACORT) 55 mcg nasal inhaler 2 Sprays by Both Nostrils route daily. 1 Bottle 0   ??? Blood-Glucose Meter monitoring kit Check sugars 2 times per day, fasting and 2 hours after dinner 1 Kit 0   ??? Lancets misc Check sugars twice daily 200 Each 2   ??? glucose blood VI test strips (ASCENSIA AUTODISC VI, ONE TOUCH ULTRA TEST VI) strip Check sugars twice daily 100 Strip 5     No current facility-administered medications on file prior to visit.      Allergies   Allergen Reactions   ??? Chocolate [Cocoa] Sneezing  Past Medical History:   Diagnosis Date   ??? Arthritis    ??? 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 COPD (chronic obstructive pulmonary disease) ([place])    ??? DM2 (diabetes mellitus, type 2) ([place])    ??? GERD (gastroesophageal reflux disease)    ??? Hepatitis C     HEP C   ??? HTN (hypertension)    ??? Lung nodule, multiple     (CT 10/2016) Small left upper lobe and 1.7 x 1.6 cm left lower lobe nodules not significantly changed from 07/23/2016 and 03/22/2016   ??? Marijuana use    ??? PTSD (post-traumatic stress disorder)     lived through the [place] in 1993   ??? Thoracic ascending aortic aneurysm ([place])     4.4cm noted on CT Chest (10/2016)   ??? Thyroid nodule     2.5 cm stable right thyroid nodule (CT 10/2016)     Social History     Tobacco Use   Smoking Status Former Smoker   Smokeless Tobacco Never Used   Tobacco Comment    reported as quit smoking around 1990s per spouse     Social History     Substance and Sexual  Activity   Alcohol Use No     Family History   Problem Relation Age of Onset   ??? Hypertension Mother    ??? Other Mother         OSA   ??? Cancer Father         suspected leukemia per pt's spouse   ??? Cancer Maternal Aunt    ??? Alcohol abuse Maternal Grandfather      Review of Systems   Constitutional: Positive for malaise/fatigue. Negative for fever and weight loss.   Eyes: Negative for blurred vision.   Respiratory: Negative for cough, sputum production, shortness of breath and wheezing.    Cardiovascular: Negative for chest pain and palpitations.   Gastrointestinal: Negative for diarrhea, nausea and vomiting.   Genitourinary: Negative for dysuria, flank pain, frequency and hematuria.        Foul smelling urine- per husband   Musculoskeletal: Positive for back pain and joint pain.        Right hip pain with radiation down right leg   Neurological: Positive for weakness. Negative for dizziness.        Generalized weakness   Psychiatric/Behavioral: Positive for memory loss.     Visit Vitals  Pulse 97   Temp 98.7 ??F (37.1 ??C) (Oral)   Resp 19   Ht '5\' 8"'  (1.727 m)   Wt 189 lb (85.7 kg)   SpO2 91%   BMI 28.74 kg/m??     Physical Exam   Constitutional: She is oriented to person, place, and time. She appears well-developed and well-nourished. No distress.   Cardiovascular: Normal rate, regular rhythm and normal heart sounds.   Pulses:       Radial pulses are 2+ on the right side, and 2+ on the left side.   Pulmonary/Chest: Effort normal and breath sounds normal. No respiratory distress. She has no wheezes. She has no rales.   Musculoskeletal: She exhibits no edema.   Sitting in wheelchair   Neurological: She is alert and oriented to person, place, and time.   Repeating herself to myself and to nurse   Skin: Skin is warm and dry.   Psychiatric: She has a normal mood and affect. Her behavior is normal.  Nursing note and vitals reviewed.    Results for orders placed or performed in visit on 11/02/17   AMB POC URINALYSIS DIP STICK  AUTO W/O MICRO     Status: None   Result Value Ref Range Status    Color (UA POC) Yellow  Final    Clarity (UA POC) Cloudy  Final    Glucose (UA POC) 2+ Negative Final    Bilirubin (UA POC) Negative Negative Final    Ketones (UA POC) Trace Negative Final    Specific gravity (UA POC) 1.015 1.001 - 1.035 Final    Blood (UA POC) 2+ Negative Final    pH (UA POC) 6.5 4.6 - 8.0 Final    Protein (UA POC) Trace Negative Final    Urobilinogen (UA POC) 0.2 mg/dL 0.2 - 1 Final    Nitrites (UA POC) Positive Negative Final    Leukocyte esterase (UA POC) 2+ Negative Final     Lab Results   Component Value Date/Time    Hemoglobin A1c 9.5 (H) 08/07/2017 03:25 AM    Hemoglobin A1c (POC) 9.0 11/02/2017 02:44 PM    Hemoglobin A1c, External 6.6 01/08/2014     ASSESSMENT and PLAN    ICD-10-CM ICD-9-CM    1. COPD with acute exacerbation ([place]) J44.1 491.21    2. Right hip pain M25.551 719.45 ketorolac tromethamine (TORADOL) 60 mg/2 mL soln   3. Right arm pain M79.601 729.5 ketorolac tromethamine (TORADOL) 60 mg/2 mL soln      REFERRAL TO HOME HEALTH   4. Physical deconditioning R53.81 799.3 REFERRAL TO [place]   5. Chronic bronchitis, unspecified chronic bronchitis type ([place]) J42 491.9    6. Abnormal urine odor R82.90 791.9 AMB POC URINALYSIS DIP STICK AUTO W/O MICRO   7. 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 Memory changes R41.3 780.93 REFERRAL TO NEUROLOGY   9. Acute cystitis without hematuria N30.00 595.0 nitrofurantoin, macrocrystal-monohydrate, (MACROBID) 100 mg capsule      CULTURE, URINE      COPD exacerbation- better since hospital discharge. She has already been referred to [facility].   Chronic right sided hip and arm pain- IM toradol in the office. Will not treat with narcotics.   I placed an order for [facility] due to physical deconditioning and frequent hospitalizations.   Treating for UTI with macrobid. Sending urine out for culture as well.   I am concerned about her  memory changes- will have eval by Neuro.   Reviewed medication and side effects. Patient agrees with the plan and verbalizes understanding.     Wallene Huh, PA-C  11/02/2017

## 2017-11-02 NOTE — Progress Notes (Signed)
[name] [name] is a 67 y.o. female here for hospital follow up

## 2017-11-02 NOTE — Progress Notes (Signed)
Urine culture shows E coli. Macrobid is susceptible so this should clear up her infection. I want her to come back one week after completion of her abx to leave a sample and I will send out for culture to ensure infection has cleared.

## 2017-11-03 ENCOUNTER — Encounter

## 2017-11-06 ENCOUNTER — Encounter

## 2017-11-06 LAB — CULTURE, URINE
Culture result:: 25000 — AB
Culture result:: >100000 — AB

## 2017-11-07 ENCOUNTER — Encounter: Admit: 2017-11-07 | Discharge: 2017-11-07 | Primary: Physician Assistant

## 2017-11-09 ENCOUNTER — Encounter

## 2017-11-10 ENCOUNTER — Encounter: Primary: Physician Assistant

## 2017-11-10 MED ORDER — NABUMETONE 750 MG TAB
750 mg | ORAL_TABLET | ORAL | 0 refills | Status: DC
Start: 2017-11-10 — End: 2017-11-18

## 2017-11-13 ENCOUNTER — Encounter: Primary: Physician Assistant

## 2017-11-14 ENCOUNTER — Emergency Department: Admit: 2017-11-15 | Payer: MEDICARE | Primary: Physician Assistant

## 2017-11-14 ENCOUNTER — Inpatient Hospital Stay
Admit: 2017-11-14 | Discharge: 2017-11-18 | Disposition: A | Discharge status: Home Health | Payer: MEDICARE | Attending: Internal Medicine | Admitting: Internal Medicine

## 2017-11-14 DIAGNOSIS — J9621 Acute and chronic respiratory failure with hypoxia: Secondary | ICD-10-CM

## 2017-11-14 MED ORDER — METHYLPREDNISOLONE (PF) 125 MG/2 ML IJ SOLR
125 mg/2 mL | INTRAMUSCULAR | Status: AC
Start: 2017-11-14 — End: 2017-11-14
  Administered 2017-11-14: via INTRAVENOUS

## 2017-11-14 MED ORDER — IPRATROPIUM-ALBUTEROL 2.5 MG-0.5 MG/3 ML NEB SOLUTION
2.5 mg-0.5 mg/3 ml | RESPIRATORY_TRACT | Status: AC
Start: 2017-11-14 — End: 2017-11-14
  Administered 2017-11-14: via RESPIRATORY_TRACT

## 2017-11-14 MED ORDER — ALBUTEROL SULFATE 0.083 % (0.83 MG/ML) SOLN FOR INHALATION
2.5 mg /3 mL (0.083 %) | RESPIRATORY_TRACT | Status: AC
Start: 2017-11-14 — End: 2017-11-14
  Administered 2017-11-14: via RESPIRATORY_TRACT

## 2017-11-14 MED ORDER — SODIUM CHLORIDE 0.9% BOLUS IV
0.9 % | INTRAVENOUS | Status: AC
Start: 2017-11-14 — End: 2017-11-14
  Administered 2017-11-14: via INTRAVENOUS

## 2017-11-14 MED FILL — SOLU-MEDROL (PF) 125 MG/2 ML SOLUTION FOR INJECTION: 125 mg/2 mL | INTRAMUSCULAR | Qty: 2

## 2017-11-14 NOTE — Telephone Encounter (Signed)
She would need to be evaluated for this. Or if she is already seeing a Pulmonologist, please call to be seen by them

## 2017-11-14 NOTE — Other (Addendum)
TRANSFER - IN REPORT:    Verbal report received from Washington MutualPeyton Reynolds RN(name) on FloydMonica R Ausborn  being received from ED(unit) for routine progression of care      Report consisted of patient???s Situation, Background, Assessment and   Recommendations(SBAR).     Information from the following report(s) SBAR, Kardex, ED Summary, Intake/Output, MAR, Recent Results and Cardiac Rhythm NST was reviewed with the receiving nurse.    Opportunity for questions and clarification was provided.      Assessment completed upon patient???s arrival to unit and care assumed.     2315 Pt transferred on nasal cannula 2 L/min. Pt noted to have increase work of breathing and SOB after moving to ICU bed. Placed back on BIPAP machine and allowed to settle.     0200 Pt placed on nasal cannula. O2 saturation 98-99%, RR 16-18.     Bedside and Verbal shift change report given to Olegario MessierKathy RN (oncoming nurse) by Hattie PerchNiveen Conliffe RN and Elisha PonderAngela Ritter RN (offgoing nurse). Report included the following information SBAR, Kardex, ED Summary, Intake/Output, MAR, Med Rec Status and Cardiac Rhythm ST.

## 2017-11-14 NOTE — Other (Signed)
TRANSFER - OUT REPORT:    Verbal report given to [facility] RN (name) on [name] [name]  being transferred to ICU 4 (unit) for routine progression of care       Report consisted of patient???s Situation, Background, Assessment and   Recommendations(SBAR).     Information from the following report(s) SBAR was reviewed with the receiving nurse.    Lines:       Opportunity for questions and clarification was provided.      Patient transported with:   RN and Respiratory

## 2017-11-14 NOTE — ED Notes (Signed)
Pt's work of breathing has eased. RR 22 bpm. RR are still slightly labored.     States "Thank y'all so much. This [BiPAP] helps me to breath so much better".    Call bell within reach.    [place] environment created.

## 2017-11-14 NOTE — ED Notes (Signed)
RT at bedside placing pt on BiPAP.

## 2017-11-14 NOTE — ED Provider Notes (Signed)
[facility]  Emergency Department Treatment Report    Patient: [name] [name] Age: 67 y.o. Sex: female    Date of Birth: 1951-03-11 Admit Date: 11/14/2017 PCP: Ernst Breach   MRN: [id number]  CSN: [id number]  Attending: Tawanna Cooler, MD   Room: ER06/ER06 Time Dictated: 9:41 PM        Chief Complaint   Chief Complaint   Patient presents with   ??? COPD   ??? Shortness of Breath       History of Present Illness     Patient with a history of COPD hypertension diabetes prior history of acute renal failure requiring 1 round of dialysis but not currently on dialysis presents with shortness of breath, was well yesterday, symptoms that started today, slowly progressive, denies any fever URI type symptoms no runny nose no purulent sputum no chest pain.  She has severe shortness of breath despite using her normal control medication and when necessary medications.  No dental pain vomiting or diarrhea no history of PE DVT recent travel recent surgery leg pain or swelling.  As the patient is without physical symptoms or complaints of pain, there is no severity of pain, quality of pain, duration, modifying factors, or associated signs and symptoms regarding the pt's presenting complaint.      Review of Systems   Review of Systems   Constitutional: Negative for fever.   HENT: Negative for sore throat.    Eyes: Negative for double vision.   Respiratory: Positive for cough, shortness of breath and wheezing.    Cardiovascular: Negative for chest pain and leg swelling.   Gastrointestinal: Negative for abdominal pain.   Genitourinary: Negative for dysuria.   Musculoskeletal: Negative for back pain.   Skin: Negative for rash.   Neurological: Negative for headaches.       Past Medical/Surgical History     Past Medical History:   Diagnosis Date   ??? Arthritis    ??? 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 COPD (chronic obstructive pulmonary disease) ([place])    ??? DM2 (diabetes mellitus, type 2) ([place])     ??? GERD (gastroesophageal reflux disease)    ??? Hepatitis C     HEP C   ??? HTN (hypertension)    ??? Lung nodule, multiple     (CT 10/2016) Small left upper lobe and 1.7 x 1.6 cm left lower lobe nodules not significantly changed from 07/23/2016 and 03/22/2016   ??? Marijuana use    ??? PTSD (post-traumatic stress disorder)     lived [place]   ??? Thoracic ascending aortic aneurysm ([place])     4.4cm noted on CT Chest (10/2016)   ??? Thyroid nodule     2.5 cm stable right thyroid nodule (CT 10/2016)     Past Surgical History:   Procedure Laterality Date   ??? HX ENDOSCOPY     ??? HX HIP REPLACEMENT Right 01/29/2015   ??? HX HIP REPLACEMENT     ??? HX HYSTERECTOMY  2010    hysterectomy       Social History     Social History     Socioeconomic History   ??? Marital status: MARRIED     Spouse name: Not on file   ??? Number of children: Not on file   ??? Years of education: Not on file   ??? Highest education level: Not on file   Tobacco  Use   ??? Smoking status: Former Smoker   ??? Smokeless tobacco: Never Used   ??? Tobacco comment: reported as quit smoking around 1990s per spouse   Substance and Sexual Activity   ??? Alcohol use: No   ??? Drug use: Yes     Types: Marijuana   ??? Sexual activity: Never       Family History     Family History   Problem Relation Age of Onset   ??? Hypertension Mother    ??? Other Mother         OSA   ??? Cancer Father         suspected leukemia per pt's spouse   ??? Cancer Maternal Aunt    ??? Alcohol abuse Maternal Grandfather        Current Medications     Prior to Admission Medications   Prescriptions Last Dose Informant Patient Reported? Taking?   Blood-Glucose Meter monitoring kit   No No   Sig: Check sugars 2 times per day, fasting and 2 hours after dinner   Lancets misc   No No   Sig: Check sugars twice daily   QUEtiapine SR (SEROQUEL XR) 150 mg sr tablet   No No   Sig: TAKE 1 TABLET BY MOUTH NIGHTLY   albuterol (ACCUNEB) 1.25 mg/3 mL nebu   No No    Sig: 3 mL by Nebulization route every six (6) hours as needed.   albuterol (PROVENTIL HFA, VENTOLIN HFA, PROAIR HFA) 90 mcg/actuation inhaler   No No   Sig: Take 2 Puffs by inhalation every six (6) hours for 5 days, THEN 2 Puffs every four (4) hours as needed for Wheezing or Shortness of Breath for up to 30 days.   amLODIPine (NORVASC) 10 mg tablet   No No   Sig: TAKE ONE TABLET BY MOUTH ONCE DAILY   cloNIDine HCl (CATAPRES) 0.3 mg tablet   No No   Sig: Take bid   Patient taking differently: Take 1 Tab by mouth two (2) times a day. Take bid   fluticasone propion-salmeterol (ADVAIR DISKUS) 100-50 mcg/dose diskus inhaler   No No   Sig: Take 1 Puff by inhalation two (2) times a day.   gabapentin (NEURONTIN) 100 mg capsule   Yes No   Sig: Take 600 mg by mouth three (3) times daily.   glucose blood VI test strips (ASCENSIA AUTODISC VI, ONE TOUCH ULTRA TEST VI) strip   No No   Sig: Check sugars twice daily   losartan (COZAAR) 100 mg tablet   No No   Sig: TAKE 1 TABLET BY MOUTH DAILY FOR HYPERTENSION   magnesium oxide (MAG-OX) 400 mg tablet   No No   Sig: TAKE 1 TABLET BY MOUTH DAILY   metFORMIN ER (GLUCOPHAGE XR) 750 mg tablet   No No   Sig: Take 1 Tab by mouth two (2) times a day.   montelukast (SINGULAIR) 10 mg tablet   No No   Sig: TAKE 1 TABLET BY MOUTH DAILY   nabumetone (RELAFEN) 750 mg tablet   No No   Sig: TAKE 1 TABLET BY MOUTH TWICE DAILY AS NEEDED FOR PAIN. USE SPARINGLY   polyethylene glycol (MIRALAX) 17 gram packet   No No   Sig: Take 1 Packet by mouth daily.   rosuvastatin (CRESTOR) 5 mg tablet   No No   Sig: TAKE 1 TABLET BY MOUTH NIGHTLY   spironolactone (ALDACTONE) 100 mg tablet   No No  Sig: TAKE 1 TABLET BY MOUTH DAILY   tiotropium bromide (SPIRIVA RESPIMAT) 2.5 mcg/actuation inhaler   No No   Sig: Take 1 Puff by inhalation daily.   traZODone (DESYREL) 50 mg tablet   No No   Sig: Take 1 Tab by mouth nightly as needed for Sleep.   triamcinolone (NASACORT) 55 mcg nasal inhaler   No No    Sig: 2 Sprays by Both Nostrils route daily.      Facility-Administered Medications: None       Allergies     Allergies   Allergen Reactions   ??? Chocolate [Cocoa] Sneezing       Physical Exam/ED Course / Medical Decision Making     ED Triage Vitals   Enc Vitals Group      BP 11/14/17 1930 (!) 127/101      Pulse (Heart Rate) 11/14/17 1944 (!) 133      Resp Rate 11/14/17 1944 25      Temp 11/14/17 1957 98.1 ??F (36.7 ??C)      Temp src --       O2 Sat (%) 11/14/17 1922 (!) 86 %      Weight 11/14/17 2002 190 lb      Height 11/14/17 2002 5' 10"       Head Circumference --       Peak Flow --       Pain Score --       Pain Loc --       Pain Edu? --       Excl. in [place]? --        Physical exam:  General:  Well-developed, well-nourished, marked respiratory distress unable to speak wheezing all lung fields, bronchospastic cough tachypneic nondiaphoretic.    Head:  Normocephalic atraumatic.    Eyes:  Pupils midrange extraocular movements intact.  No pallor or conjunctival injection.    Nose:  No rhinorrhea, inspection grossly normal.    Ears:  Grossly normal to inspection, no discharge.    Mouth:  Mucous membranes moist, no appreciable intraoral lesion.    Neck/Back:  Trachea midline, no asymmetry.  No JVD  Chest:  Grossly normal inspection, symmetric chest rise.    Pulmonary: Wheezing all lung fields poor air movement.    Cardiovascular:  S1-S2 no murmurs rubs or gallops.    Abdomen: Soft, nontender, nondistended no guarding rebound or peritoneal signs.    Extremities:  Grossly normal to inspection, peripheral pulses intact  no pain on palpation of the calves no asymmetry  Neurologic:  Alert and oriented no appreciable focal neurologic deficit    Skin:  Warm and dry  Nursing note reviewed, vital signs reviewed.    ED course:  Patient presents in respiratory distress, seems to be severe COPD exacerbation, she has no pain complaint but given the acute of her presentation we will still rule out ACS pneumonia or other infectious  process, given her marked respiratory distress we will place her immediately on BiPAP ABG and continue neb wiser treatments and Solu-Medrol and likely admit for further management    EKG done at 2029 hrs. sinus tachycardia heart rate 103 intervals within normal limits no ST changes no ectopy.  Isolated T wave inversion in 3 and V6 nonspecific.    Patient revived by myself after being on BiPAP, now able speak in long sentences much more comfortable-appearing    Chest x-ray groundglass opacity likely atelectasis    Labs:  white blood cell count of 22.4 given the  chest x-ray findings and leukocytosis, now suspicious for pneumonia at 2041, started broad-spectrum antibiotics, blood culture lactic acid and plan for admission troponin and BNP negative chemistry unremarkable    ABG 7.327/40 6/159 this is respiratory acidosis      Patient's presentation, history, physical exam and laboratory evaluations were reviewed.  I felt the patient would benefit from inpatient management and treatment.     Consult:  Discussed care with Dr. Winona Legato. Standard discussion; including history of patient???s chief complaint, available diagnostic results, and treatment course.  Patient was accepted to their service.          Disposition:    Admitted to ICU      Portions of this chart were created with Dragon medical speech to text program.   Unrecognized errors may be present.    I have spent 40 minutes of critical care time (excluding time for other separate services) involved in lab review, consultations with specialist, family decision-making, and documentation.  During this entire length of time I was immediately available to the patient.    Critical Care:  The reason for providing this level of medical care for this critically ill patient was due a critical illness that impaired one or more vital organ systems such that there was a high probability of imminent or life threatening deterioration in the patients condition. This  care involved high complexity decision making to assess, manipulate, and support vital system functions, to treat this degree of vital organ system failure and to prevent further life threatening deterioration of the patient???s condition.           Final Diagnosis   No diagnosis found.      Diagnostic Studies   Lab:   Recent Results (from the past 12 hour(s))   CBC WITH AUTOMATED DIFF    Collection Time: 11/14/17  7:28 PM   Result Value Ref Range    WBC 22.4 (H) 4.0 - 11.0 1000/mm3    NEUTROPHILS 50.5 34 - 64 %    LYMPHOCYTES 42.9 28 - 48 %    RBC 4.70 3.60 - 5.20 M/uL    MONOCYTES 4.8 1 - 13 %    HGB 13.9 13.0 - 17.2 gm/dl    HCT 41.2 37.0 - 50.0 %    BASOPHILS 0.9 0 - 3 %    MCV 87.7 80.0 - 98.0 fL    MCH 29.6 25.4 - 34.6 pg    MCHC 33.7 30.0 - 36.0 gm/dl    PLATELET 398 140 - 450 1000/mm3    MPV 11.7 (H) 6.0 - 10.0 fL    ATYPICAL LYMPHS 0.9 (H) 0 - 0 %    RDW-SD 42.4 36.4 - 46.3      NRBC 0 0 - 0     METABOLIC PANEL, BASIC    Collection Time: 11/14/17  7:28 PM   Result Value Ref Range    Sodium 134 (L) 136 - 145 mEq/L    Potassium 4.6 3.5 - 5.1 mEq/L    Chloride 99 98 - 107 mEq/L    CO2 26 21 - 32 mEq/L    Glucose 379 (H) 74 - 106 mg/dl    BUN 20 7 - 25 mg/dl    Creatinine 1.3 0.6 - 1.3 mg/dl    GFR est AA 53.0      GFR est non-AA 44      Calcium 9.8 8.5 - 10.1 mg/dl    Anion gap 9 5 - 15 mmol/L   TROPONIN I  Collection Time: 11/14/17  7:28 PM   Result Value Ref Range    Troponin-I <0.015 0.000 - 0.045 ng/ml   NT-PRO BNP    Collection Time: 11/14/17  7:28 PM   Result Value Ref Range    NT pro-BNP 33.0 0.0 - 125.0 pg/ml   POC BLOOD GAS + LACTIC ACID    Collection Time: 11/14/17  8:42 PM   Result Value Ref Range    pH 7.327 (L) 7.350 - 7.450      PCO2 46.5 (H) 35.0 - 45.0 mm Hg    PO2 159.0 (H) 75 - 100 mm Hg    BICARBONATE 24.5 18.0 - 26.0 mmol/L    O2 SAT 99.0 90 - 100 %    CO2, TOTAL 26.0 24 - 29 mmol/L    Lactic Acid 3.48 (H) 0.40 - 2.00 mmol/L    BASE EXCESS -2 -2 - 3 mmol/L    Sample type Art       FIO2 40      SITE R Radial      DEVICE BiPAP      ALLENS TEST Pass      IPAP/PIP 18      EPAP/CPAP/PEEP 8      Respiratory Rate 18      SET RATE 12      VTEXP 605         Imaging:    Xr Chest Sngl V    Result Date: 11/14/2017  Indication: dyspnea.    .     IMPRESSION: Portable AP upright view the chest exposed at 8:13 PM November 14, 2017 reveals patchy infiltrates at the lung bases with groundglass granularity consistent with emphysema and atelectasis similar to September 13, 2017. The heart is of normal size.                  Medications   sodium chloride 0.9 % bolus infusion 1,000 mL (has no administration in time range)   sodium chloride 0.9 % bolus infusion 1,000 mL (has no administration in time range)   cefTRIAXone (ROCEPHIN) 1 g in 0.9% sodium chloride (MBP/ADV) 50 mL MBP (has no administration in time range)   azithromycin (ZITHROMAX) 500 mg in 0.9% sodium chloride 250 mL IVPB (has no administration in time range)   albuterol-ipratropium (DUO-NEB) 2.5 MG-0.5 MG/3 ML (3 mL Nebulization Given 11/14/17 1956)   methylPREDNISolone (PF) (Solu-MEDROL) injection 125 mg (125 mg IntraVENous Given 11/14/17 1953)   albuterol (PROVENTIL VENTOLIN) nebulizer solution 5 mg (5 mg Nebulization Given 11/14/17 1956)   sodium chloride 0.9 % bolus infusion 1,000 mL (1,000 mL IntraVENous New Bag 11/14/17 1954)          Results of lab/radiology tests were discussed with the patient. . All questions were answered and concerns addressed.        My signature above authenticates this document and my orders, the final ??  diagnosis (es), discharge prescription (s), and instructions in the Epic record.  If you have any questions please contact [phone].  ??

## 2017-11-14 NOTE — H&P (Signed)
Hospitalist Admission History and Physical    NAME:  [name] [name]   DOB:   April 06, 1951   MRN:   [id number]     PCP:  Wallene Huh, PA-C  Admission Date/Time:  11/14/2017 10:19 PM  Anticipated Date of Discharge: 6/8  Anticipated Disposition (home, SNF) : SNF     Patient is septic from pneumonia, has respiratory failure and is on BiPAP, at high risk of multiorgan failure from sepsis and respiratory failure needing intubation if does not continue to get better, will need to be watched in the ICU very closely    Assessment/Plan:      Active Problems:    COPD exacerbation ([place]) (11/14/2017)       Acute hypoxic respiratory failure saturating 86% on room air  Sepsis secondary to pneumonia  Bilateral pneumonia   Asthma/COPD exacerbation  Lactic acidosis   18 x 18 left lower lobe cavitary nodule   Right thyroid nodule  Type 2 diabetes mellitus uncontrolled with hyperglycemia  Hyperlipidemia  Hypertension    ___________________________________________________  PLAN:    Admit to ICU  Antibiotic for pneumonia, blood culture  Urine antigen for pneumonia, sputum culture  IV steroids  Glucommander for hyperglycemia  Trend lactic acid  Cultures x2  Electrolyte replacement per ICU protocol  We will hold patient's blood pressure medication clonidine, metformin, losartan, trazodone, Seroquel, restart of them in the morning when blood pressure is better and patient is more awake  Continue statin        Risk of deterioration:  [] Low    [] Moderate  [x] High    Prophylaxis:  [] Lovenox  [] Coumadin  [x] Hep SQ  [] SCD???s  [] H2B/PPI    Disposition:  [] Home w/ Family   [x] HH PT,OT,RN   [] SNF/LTC   [] SAH/Rehab             Subjective:   CHIEF COMPLAINT:    Chief Complaint   Patient presents with   ??? COPD   ??? Shortness of Breath       HISTORY OF PRESENT ILLNESS:     [name] [name] is a 67 y.o. BLACK [place] female who presents with respiratory distress, nonproductive cough  Has history of COPD, and has a stable pulmonary nodule and is followed by  pulmonology as an outpatient  She has history of COPD/asthma and was admitted to the hospital last time for acute asthma exacerbation with hypoxic respiratory failure  She was intubated in the setting of septic shock and marked and was in ICU on pressors and antibiotics for a long time  She said the shortness of breath has been progressively getting worse, not associated with any lower extremity swelling, chest pain or fever      Past Medical History:   Diagnosis Date   ??? Arthritis    ??? 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 COPD (chronic obstructive pulmonary disease) ([place])    ??? DM2 (diabetes mellitus, type 2) ([place])    ??? GERD (gastroesophageal reflux disease)    ??? Hepatitis C     HEP C   ??? HTN (hypertension)    ??? Lung nodule, multiple     (CT 10/2016) Small left upper lobe and 1.7 x 1.6 cm left lower lobe nodules not significantly changed from 07/23/2016 and 03/22/2016   ??? Marijuana use    ??? PTSD (post-traumatic stress disorder)     lived through the [place] in 1993   ??? Thoracic ascending  aortic aneurysm ([place])     4.4cm noted on CT Chest (10/2016)   ??? Thyroid nodule     2.5 cm stable right thyroid nodule (CT 10/2016)        Past Surgical History:   Procedure Laterality Date   ??? HX ENDOSCOPY     ??? HX HIP REPLACEMENT Right 01/29/2015   ??? HX HIP REPLACEMENT     ??? HX HYSTERECTOMY  2010    hysterectomy       Social History     Tobacco Use   ??? Smoking status: Former Smoker   ??? Smokeless tobacco: Never Used   ??? Tobacco comment: reported as quit smoking around 1990s per spouse   Substance Use Topics   ??? Alcohol use: No        Family History   Problem Relation Age of Onset   ??? Hypertension Mother    ??? Other Mother         OSA   ??? Cancer Father         suspected leukemia per pt's spouse   ??? Cancer Maternal Aunt    ??? Alcohol abuse Maternal Grandfather         Allergies   Allergen Reactions   ??? Chocolate [Cocoa] Sneezing        Prior to Admission Medications    Prescriptions Last Dose Informant Patient Reported? Taking?   Blood-Glucose Meter monitoring kit   No No   Sig: Check sugars 2 times per day, fasting and 2 hours after dinner   Lancets misc   No No   Sig: Check sugars twice daily   QUEtiapine SR (SEROQUEL XR) 150 mg sr tablet   No No   Sig: TAKE 1 TABLET BY MOUTH NIGHTLY   albuterol (ACCUNEB) 1.25 mg/3 mL nebu   No No   Sig: 3 mL by Nebulization route every six (6) hours as needed.   albuterol (PROVENTIL HFA, VENTOLIN HFA, PROAIR HFA) 90 mcg/actuation inhaler   No No   Sig: Take 2 Puffs by inhalation every six (6) hours for 5 days, THEN 2 Puffs every four (4) hours as needed for Wheezing or Shortness of Breath for up to 30 days.   amLODIPine (NORVASC) 10 mg tablet   No No   Sig: TAKE ONE TABLET BY MOUTH ONCE DAILY   cloNIDine HCl (CATAPRES) 0.3 mg tablet   No No   Sig: Take bid   Patient taking differently: Take 1 Tab by mouth two (2) times a day. Take bid   fluticasone propion-salmeterol (ADVAIR DISKUS) 100-50 mcg/dose diskus inhaler   No No   Sig: Take 1 Puff by inhalation two (2) times a day.   gabapentin (NEURONTIN) 100 mg capsule   Yes No   Sig: Take 600 mg by mouth three (3) times daily.   glucose blood VI test strips (ASCENSIA AUTODISC VI, ONE TOUCH ULTRA TEST VI) strip   No No   Sig: Check sugars twice daily   losartan (COZAAR) 100 mg tablet   No No   Sig: TAKE 1 TABLET BY MOUTH DAILY FOR HYPERTENSION   magnesium oxide (MAG-OX) 400 mg tablet   No No   Sig: TAKE 1 TABLET BY MOUTH DAILY   metFORMIN ER (GLUCOPHAGE XR) 750 mg tablet   No No   Sig: Take 1 Tab by mouth two (2) times a day.   montelukast (SINGULAIR) 10 mg tablet   No No   Sig: TAKE 1 TABLET BY MOUTH DAILY  nabumetone (RELAFEN) 750 mg tablet   No No   Sig: TAKE 1 TABLET BY MOUTH TWICE DAILY AS NEEDED FOR PAIN. USE SPARINGLY   polyethylene glycol (MIRALAX) 17 gram packet   No No   Sig: Take 1 Packet by mouth daily.   rosuvastatin (CRESTOR) 5 mg tablet   No No   Sig: TAKE 1 TABLET BY MOUTH NIGHTLY    spironolactone (ALDACTONE) 100 mg tablet   No No   Sig: TAKE 1 TABLET BY MOUTH DAILY   tiotropium bromide (SPIRIVA RESPIMAT) 2.5 mcg/actuation inhaler   No No   Sig: Take 1 Puff by inhalation daily.   traZODone (DESYREL) 50 mg tablet   No No   Sig: Take 1 Tab by mouth nightly as needed for Sleep.   triamcinolone (NASACORT) 55 mcg nasal inhaler   No No   Sig: 2 Sprays by Both Nostrils route daily.      Facility-Administered Medications: None           REVIEW OF SYSTEMS:     []  Unable to obtain  ROS due to  [] mental status change  [] sedated   [] intubated   [x] Total of 12 systems reviewed as follows:  Constitutional: negative fever, negative chills, negative weight loss  Eyes:   negative visual changes  ENT:   negative sore throat, tongue or lip swelling  Respiratory:  Positive for respiratory distress cough  Cards:  negative for chest pain, palpitations, lower extremity edema  GI:   negative for nausea, vomiting, diarrhea, and abdominal pain  Genitourinary: negative for frequency, dysuria  Integument:  negative for rash and pruritus  Hematologic:  negative for easy bruising and gum/nose bleeding  Musculoskel: negative for myalgias,  back pain and muscle weakness  Neurological:  negative for headaches, dizziness, vertigo  Behavl/Psych: negative for feelings of anxiety, depression       Objective:   VITALS:    Visit Vitals  BP (!) 123/92   Pulse 97   Temp 98.1 ??F (36.7 ??C)   Resp 17   Ht 5' 10"  (1.778 m)   Wt 86.2 kg (190 lb)   SpO2 100%   BMI 27.26 kg/m??     Temp (24hrs), Avg:98.1 ??F (36.7 ??C), Min:98.1 ??F (36.7 ??C), Max:98.1 ??F (36.7 ??C)      PHYSICAL EXAM:   General:    Patient is in severe respiratory distress secondary to work of breathing.   Has started to feel better on BiPAP  Head:   Normocephalic, without obvious abnormality, atraumatic.  Eyes:   Conjunctivae clear, anicteric sclerae.  Pupils are equal  Nose:  Nares normal. No drainage or sinus tenderness.   Throat:    Lips, mucosa, and tongue normal.  No Thrush  Neck:  Supple, symmetrical,  no adenopathy, thyroid: non tender    no carotid bruit and no JVD.  Back:    Symmetric,  No CVA tenderness.  Lungs:   Clear to auscultation bilaterally.  No Wheezing or Rhonchi. No rales.  Chest wall:  Patient is tachypneic using accessory muscles of respiration   heart:   S1-S2 heard sinus tachycardia  Abdomen:   Soft, non-tender. Not distended.  Bowel sounds normal. No masses  Extremities: Extremities normal, atraumatic, No cyanosis.  No edema. No clubbing  Skin:     Texture, turgor normal. No rashes or lesions.  Not Jaundiced  Psych:  Patient is anxious  Neurologic: EOMs intact. No facial asymmetry. No aphasia or slurred speech. Normal  strength, Alert and oriented  X 3.       LAB DATA REVIEWED:    Recent Results (from the past 12 hour(s))   CBC WITH AUTOMATED DIFF    Collection Time: 11/14/17  7:28 PM   Result Value Ref Range    WBC 22.4 (H) 4.0 - 11.0 1000/mm3    NEUTROPHILS 50.5 34 - 64 %    LYMPHOCYTES 42.9 28 - 48 %    RBC 4.70 3.60 - 5.20 M/uL    MONOCYTES 4.8 1 - 13 %    HGB 13.9 13.0 - 17.2 gm/dl    HCT 41.2 37.0 - 50.0 %    BASOPHILS 0.9 0 - 3 %    MCV 87.7 80.0 - 98.0 fL    MCH 29.6 25.4 - 34.6 pg    MCHC 33.7 30.0 - 36.0 gm/dl    PLATELET 398 140 - 450 1000/mm3    MPV 11.7 (H) 6.0 - 10.0 fL    ATYPICAL LYMPHS 0.9 (H) 0 - 0 %    RDW-SD 42.4 36.4 - 46.3      NRBC 0 0 - 0     METABOLIC PANEL, BASIC    Collection Time: 11/14/17  7:28 PM   Result Value Ref Range    Sodium 134 (L) 136 - 145 mEq/L    Potassium 4.6 3.5 - 5.1 mEq/L    Chloride 99 98 - 107 mEq/L    CO2 26 21 - 32 mEq/L    Glucose 379 (H) 74 - 106 mg/dl    BUN 20 7 - 25 mg/dl    Creatinine 1.3 0.6 - 1.3 mg/dl    GFR est AA 53.0      GFR est non-AA 44      Calcium 9.8 8.5 - 10.1 mg/dl    Anion gap 9 5 - 15 mmol/L   TROPONIN I    Collection Time: 11/14/17  7:28 PM   Result Value Ref Range    Troponin-I <0.015 0.000 - 0.045 ng/ml   NT-PRO BNP     Collection Time: 11/14/17  7:28 PM   Result Value Ref Range    NT pro-BNP 33.0 0.0 - 125.0 pg/ml   POC BLOOD GAS + LACTIC ACID    Collection Time: 11/14/17  8:42 PM   Result Value Ref Range    pH 7.327 (L) 7.350 - 7.450      PCO2 46.5 (H) 35.0 - 45.0 mm Hg    PO2 159.0 (H) 75 - 100 mm Hg    BICARBONATE 24.5 18.0 - 26.0 mmol/L    O2 SAT 99.0 90 - 100 %    CO2, TOTAL 26.0 24 - 29 mmol/L    Lactic Acid 3.48 (H) 0.40 - 2.00 mmol/L    BASE EXCESS -2 -2 - 3 mmol/L    Sample type Art      FIO2 40      SITE R Radial      DEVICE BiPAP      ALLENS TEST Pass      IPAP/PIP 18      EPAP/CPAP/PEEP 8      Respiratory Rate 18      SET RATE 12      VTEXP 605           IMAGING RESULTS:    Xr Chest Sngl V    Result Date: 11/14/2017  Indication: dyspnea.    .     IMPRESSION: Portable AP upright view the chest exposed at 8:13 PM November 14, 2017 reveals patchy infiltrates at the lung bases with groundglass granularity consistent with emphysema and atelectasis similar to September 13, 2017. The heart is of normal size.         Care Plan discussed with:     [x] Patient   [x] Family    [] ED Care Manager  [] ED Doc   [] Specialist :    Total critical care time spent on reviewing the case/data/notes/EMR,examining the patient,documentation,coordinating care with nurses and consultants is 40 minutes.       ___________________________________________________  Admitting Physician: Charlaine Dalton, MD     Dragon medical dictation software was used for portions of this report.  Unintended voice transcription errors may have occurred.

## 2017-11-14 NOTE — Telephone Encounter (Signed)
Nursing staff with [facility] called stating that they were conducting a home visit with patient [name] [name] and states that the patient has a cough. Nurse states that patient is currently taking Robitussin but believes she needs additional medication. Nurse is requesting that if additional medication is given the listed [facility] would be appropriate. Please advise.

## 2017-11-14 NOTE — ED Triage Notes (Signed)
Brought in by husband in resp distress.    Denies fevers and chills.    Hx:COPD    Nonproductive cough.    Dr.Burke is her Pulmonary Dr (has a spot on her lung that the doctor is watching per husband)    Does not wear oxygen at home.

## 2017-11-14 NOTE — ED Notes (Signed)
 Pt's work of breathing has eased. RR 22 bpm. RR are still slightly labored.     States Thank y'all so much. This [BiPAP] helps me to breath so much better.    Call bell within reach.    [place] environment created.

## 2017-11-14 NOTE — H&P (Signed)
Hospitalist Admission History and Physical    NAME:  [name] [name]   DOB:   April 06, 1951   MRN:   [id number]     PCP:  Wallene Huh, PA-C  Admission Date/Time:  11/14/2017 10:19 PM  Anticipated Date of Discharge: 6/8  Anticipated Disposition (home, SNF) : SNF     Patient is septic from pneumonia, has respiratory failure and is on BiPAP, at high risk of multiorgan failure from sepsis and respiratory failure needing intubation if does not continue to get better, will need to be watched in the ICU very closely    Assessment/Plan:      Active Problems:    COPD exacerbation ([place]) (11/14/2017)       Acute hypoxic respiratory failure saturating 86% on room air  Sepsis secondary to pneumonia  Bilateral pneumonia   Asthma/COPD exacerbation  Lactic acidosis   18 x 18 left lower lobe cavitary nodule   Right thyroid nodule  Type 2 diabetes mellitus uncontrolled with hyperglycemia  Hyperlipidemia  Hypertension    ___________________________________________________  PLAN:    Admit to ICU  Antibiotic for pneumonia, blood culture  Urine antigen for pneumonia, sputum culture  IV steroids  Glucommander for hyperglycemia  Trend lactic acid  Cultures x2  Electrolyte replacement per ICU protocol  We will hold patient's blood pressure medication clonidine, metformin, losartan, trazodone, Seroquel, restart of them in the morning when blood pressure is better and patient is more awake  Continue statin        Risk of deterioration:  [] Low    [] Moderate  [x] High    Prophylaxis:  [] Lovenox  [] Coumadin  [x] Hep SQ  [] SCD???s  [] H2B/PPI    Disposition:  [] Home w/ Family   [x] HH PT,OT,RN   [] SNF/LTC   [] SAH/Rehab             Subjective:   CHIEF COMPLAINT:    Chief Complaint   Patient presents with   ??? COPD   ??? Shortness of Breath       HISTORY OF PRESENT ILLNESS:     [name] [name] is a 67 y.o. BL[place] female who presents with respiratory distress, nonproductive cough  Has history of COPD, and has a stable pulmonary nodule and is followed by  pulmonology as an outpatient  She has history of COPD/asthma and was admitted to the hospital last time for acute asthma exacerbation with hypoxic respiratory failure  She was intubated in the setting of septic shock and marked and was in ICU on pressors and antibiotics for a long time  She said the shortness of breath has been progressively getting worse, not associated with any lower extremity swelling, chest pain or fever      Past Medical History:   Diagnosis Date   ??? Arthritis    ??? 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 COPD (chronic obstructive pulmonary disease) ([place])    ??? DM2 (diabetes mellitus, type 2) ([place])    ??? GERD (gastroesophageal reflux disease)    ??? Hepatitis C     HEP C   ??? HTN (hypertension)    ??? Lung nodule, multiple     (CT 10/2016) Small left upper lobe and 1.7 x 1.6 cm left lower lobe nodules not significantly changed from 07/23/2016 and 03/22/2016   ??? Marijuana use    ??? PTSD (post-traumatic stress disorder)     lived through the [place] in 1993   ??? Thoracic ascending  aortic aneurysm ([place])     4.4cm noted on CT Chest (10/2016)   ??? Thyroid nodule     2.5 cm stable right thyroid nodule (CT 10/2016)        Past Surgical History:   Procedure Laterality Date   ??? HX ENDOSCOPY     ??? HX HIP REPLACEMENT Right 01/29/2015   ??? HX HIP REPLACEMENT     ??? HX HYSTERECTOMY  2010    hysterectomy       Social History     Tobacco Use   ??? Smoking status: Former Smoker   ??? Smokeless tobacco: Never Used   ??? Tobacco comment: reported as quit smoking around 1990s per spouse   Substance Use Topics   ??? Alcohol use: No        Family History   Problem Relation Age of Onset   ??? Hypertension Mother    ??? Other Mother         OSA   ??? Cancer Father         suspected leukemia per pt's spouse   ??? Cancer Maternal Aunt    ??? Alcohol abuse Maternal Grandfather         Allergies   Allergen Reactions   ??? Chocolate [Cocoa] Sneezing        Prior to Admission Medications   Prescriptions Last Dose  Informant Patient Reported? Taking?   Blood-Glucose Meter monitoring kit   No No   Sig: Check sugars 2 times per day, fasting and 2 hours after dinner   Lancets misc   No No   Sig: Check sugars twice daily   QUEtiapine SR (SEROQUEL XR) 150 mg sr tablet   No No   Sig: TAKE 1 TABLET BY MOUTH NIGHTLY   albuterol (ACCUNEB) 1.25 mg/3 mL nebu   No No   Sig: 3 mL by Nebulization route every six (6) hours as needed.   albuterol (PROVENTIL HFA, VENTOLIN HFA, PROAIR HFA) 90 mcg/actuation inhaler   No No   Sig: Take 2 Puffs by inhalation every six (6) hours for 5 days, THEN 2 Puffs every four (4) hours as needed for Wheezing or Shortness of Breath for up to 30 days.   amLODIPine (NORVASC) 10 mg tablet   No No   Sig: TAKE ONE TABLET BY MOUTH ONCE DAILY   cloNIDine HCl (CATAPRES) 0.3 mg tablet   No No   Sig: Take bid   Patient taking differently: Take 1 Tab by mouth two (2) times a day. Take bid   fluticasone propion-salmeterol (ADVAIR DISKUS) 100-50 mcg/dose diskus inhaler   No No   Sig: Take 1 Puff by inhalation two (2) times a day.   gabapentin (NEURONTIN) 100 mg capsule   Yes No   Sig: Take 600 mg by mouth three (3) times daily.   glucose blood VI test strips (ASCENSIA AUTODISC VI, ONE TOUCH ULTRA TEST VI) strip   No No   Sig: Check sugars twice daily   losartan (COZAAR) 100 mg tablet   No No   Sig: TAKE 1 TABLET BY MOUTH DAILY FOR HYPERTENSION   magnesium oxide (MAG-OX) 400 mg tablet   No No   Sig: TAKE 1 TABLET BY MOUTH DAILY   metFORMIN ER (GLUCOPHAGE XR) 750 mg tablet   No No   Sig: Take 1 Tab by mouth two (2) times a day.   montelukast (SINGULAIR) 10 mg tablet   No No   Sig: TAKE 1 TABLET BY MOUTH DAILY  nabumetone (RELAFEN) 750 mg tablet   No No   Sig: TAKE 1 TABLET BY MOUTH TWICE DAILY AS NEEDED FOR PAIN. USE SPARINGLY   polyethylene glycol (MIRALAX) 17 gram packet   No No   Sig: Take 1 Packet by mouth daily.   rosuvastatin (CRESTOR) 5 mg tablet   No No   Sig: TAKE 1 TABLET BY MOUTH NIGHTLY   spironolactone  (ALDACTONE) 100 mg tablet   No No   Sig: TAKE 1 TABLET BY MOUTH DAILY   tiotropium bromide (SPIRIVA RESPIMAT) 2.5 mcg/actuation inhaler   No No   Sig: Take 1 Puff by inhalation daily.   traZODone (DESYREL) 50 mg tablet   No No   Sig: Take 1 Tab by mouth nightly as needed for Sleep.   triamcinolone (NASACORT) 55 mcg nasal inhaler   No No   Sig: 2 Sprays by Both Nostrils route daily.      Facility-Administered Medications: None           REVIEW OF SYSTEMS:     '[]'$  Unable to obtain  ROS due to  '[]'$ mental status change  '[]'$ sedated   '[]'$ intubated   '[x]'$ Total of 12 systems reviewed as follows:  Constitutional: negative fever, negative chills, negative weight loss  Eyes:   negative visual changes  ENT:   negative sore throat, tongue or lip swelling  Respiratory:  Positive for respiratory distress cough  Cards:  negative for chest pain, palpitations, lower extremity edema  GI:   negative for nausea, vomiting, diarrhea, and abdominal pain  Genitourinary: negative for frequency, dysuria  Integument:  negative for rash and pruritus  Hematologic:  negative for easy bruising and gum/nose bleeding  Musculoskel: negative for myalgias,  back pain and muscle weakness  Neurological:  negative for headaches, dizziness, vertigo  Behavl/Psych: negative for feelings of anxiety, depression       Objective:   VITALS:    Visit Vitals  BP (!) 123/92   Pulse 97   Temp 98.1 ??F (36.7 ??C)   Resp 17   Ht '5\' 10"'$  (1.778 m)   Wt 86.2 kg (190 lb)   SpO2 100%   BMI 27.26 kg/m??     Temp (24hrs), Avg:98.1 ??F (36.7 ??C), Min:98.1 ??F (36.7 ??C), Max:98.1 ??F (36.7 ??C)      PHYSICAL EXAM:   General:    Patient is in severe respiratory distress secondary to work of breathing.   Has started to feel better on BiPAP  Head:   Normocephalic, without obvious abnormality, atraumatic.  Eyes:   Conjunctivae clear, anicteric sclerae.  Pupils are equal  Nose:  Nares normal. No drainage or sinus tenderness.  Throat:    Lips, mucosa, and tongue normal.  No  Thrush  Neck:  Supple, symmetrical,  no adenopathy, thyroid: non tender    no carotid bruit and no JVD.  Back:    Symmetric,  No CVA tenderness.  Lungs:   Clear to auscultation bilaterally.  No Wheezing or Rhonchi. No rales.  Chest wall:  Patient is tachypneic using accessory muscles of respiration   heart:   S1-S2 heard sinus tachycardia  Abdomen:   Soft, non-tender. Not distended.  Bowel sounds normal. No masses  Extremities: Extremities normal, atraumatic, No cyanosis.  No edema. No clubbing  Skin:     Texture, turgor normal. No rashes or lesions.  Not Jaundiced  Psych:  Patient is anxious  Neurologic: EOMs intact. No facial asymmetry. No aphasia or slurred speech. Normal  strength, Alert and oriented  X 3.       LAB DATA REVIEWED:    Recent Results (from the past 12 hour(s))   CBC WITH AUTOMATED DIFF    Collection Time: 11/14/17  7:28 PM   Result Value Ref Range    WBC 22.4 (H) 4.0 - 11.0 1000/mm3    NEUTROPHILS 50.5 34 - 64 %    LYMPHOCYTES 42.9 28 - 48 %    RBC 4.70 3.60 - 5.20 M/uL    MONOCYTES 4.8 1 - 13 %    HGB 13.9 13.0 - 17.2 gm/dl    HCT 41.2 37.0 - 50.0 %    BASOPHILS 0.9 0 - 3 %    MCV 87.7 80.0 - 98.0 fL    MCH 29.6 25.4 - 34.6 pg    MCHC 33.7 30.0 - 36.0 gm/dl    PLATELET 398 140 - 450 1000/mm3    MPV 11.7 (H) 6.0 - 10.0 fL    ATYPICAL LYMPHS 0.9 (H) 0 - 0 %    RDW-SD 42.4 36.4 - 46.3      NRBC 0 0 - 0     METABOLIC PANEL, BASIC    Collection Time: 11/14/17  7:28 PM   Result Value Ref Range    Sodium 134 (L) 136 - 145 mEq/L    Potassium 4.6 3.5 - 5.1 mEq/L    Chloride 99 98 - 107 mEq/L    CO2 26 21 - 32 mEq/L    Glucose 379 (H) 74 - 106 mg/dl    BUN 20 7 - 25 mg/dl    Creatinine 1.3 0.6 - 1.3 mg/dl    GFR est AA 53.0      GFR est non-AA 44      Calcium 9.8 8.5 - 10.1 mg/dl    Anion gap 9 5 - 15 mmol/L   TROPONIN I    Collection Time: 11/14/17  7:28 PM   Result Value Ref Range    Troponin-I <0.015 0.000 - 0.045 ng/ml   NT-PRO BNP    Collection Time: 11/14/17  7:28 PM   Result Value Ref Range    NT  pro-BNP 33.0 0.0 - 125.0 pg/ml   POC BLOOD GAS + LACTIC ACID    Collection Time: 11/14/17  8:42 PM   Result Value Ref Range    pH 7.327 (L) 7.350 - 7.450      PCO2 46.5 (H) 35.0 - 45.0 mm Hg    PO2 159.0 (H) 75 - 100 mm Hg    BICARBONATE 24.5 18.0 - 26.0 mmol/L    O2 SAT 99.0 90 - 100 %    CO2, TOTAL 26.0 24 - 29 mmol/L    Lactic Acid 3.48 (H) 0.40 - 2.00 mmol/L    BASE EXCESS -2 -2 - 3 mmol/L    Sample type Art      FIO2 40      SITE R Radial      DEVICE BiPAP      ALLENS TEST Pass      IPAP/PIP 18      EPAP/CPAP/PEEP 8      Respiratory Rate 18      SET RATE 12      VTEXP 605           IMAGING RESULTS:    Xr Chest Sngl V    Result Date: 11/14/2017  Indication: dyspnea.    .     IMPRESSION: Portable AP upright view the chest exposed at 8:13 PM November 14, 2017 reveals patchy infiltrates at the lung bases with groundglass granularity consistent with emphysema and atelectasis similar to September 13, 2017. The heart is of normal size.         Care Plan discussed with:     [x] Patient   [x] Family    [] ED Care Manager  [] ED Doc   [] Specialist :    Total critical care time spent on reviewing the case/data/notes/EMR,examining the patient,documentation,coordinating care with nurses and consultants is 40 minutes.       ___________________________________________________  Admitting Physician: Charlaine Dalton, MD     Dragon medical dictation software was used for portions of this report.  Unintended voice transcription errors may have occurred.

## 2017-11-14 NOTE — ED Notes (Signed)
Brought in by husband in resp distress.    Denies fevers and chills.    GN:[id number]:COPD    Nonproductive cough.    Dr.Burke is her Pulmonary Dr (has a spot on her lung that the doctor is watching per husband)    Does not wear oxygen at home.

## 2017-11-14 NOTE — ED Provider Notes (Signed)
[facility]  Emergency Department Treatment Report    Patient: [name] [name] Age: 67 y.o. Sex: female    Date of Birth: 1951-03-11 Admit Date: 11/14/2017 PCP: Ernst Breach   MRN: [id number]  CSN: [id number]  Attending: Tawanna Cooler, MD   Room: ER06/ER06 Time Dictated: 9:41 PM        Chief Complaint   Chief Complaint   Patient presents with   ??? COPD   ??? Shortness of Breath       History of Present Illness     Patient with a history of COPD hypertension diabetes prior history of acute renal failure requiring 1 round of dialysis but not currently on dialysis presents with shortness of breath, was well yesterday, symptoms that started today, slowly progressive, denies any fever URI type symptoms no runny nose no purulent sputum no chest pain.  She has severe shortness of breath despite using her normal control medication and when necessary medications.  No dental pain vomiting or diarrhea no history of PE DVT recent travel recent surgery leg pain or swelling.  As the patient is without physical symptoms or complaints of pain, there is no severity of pain, quality of pain, duration, modifying factors, or associated signs and symptoms regarding the pt's presenting complaint.      Review of Systems   Review of Systems   Constitutional: Negative for fever.   HENT: Negative for sore throat.    Eyes: Negative for double vision.   Respiratory: Positive for cough, shortness of breath and wheezing.    Cardiovascular: Negative for chest pain and leg swelling.   Gastrointestinal: Negative for abdominal pain.   Genitourinary: Negative for dysuria.   Musculoskeletal: Negative for back pain.   Skin: Negative for rash.   Neurological: Negative for headaches.       Past Medical/Surgical History     Past Medical History:   Diagnosis Date   ??? Arthritis    ??? 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 COPD (chronic obstructive pulmonary disease) ([place])    ??? DM2 (diabetes mellitus, type 2) ([place])     ??? GERD (gastroesophageal reflux disease)    ??? Hepatitis C     HEP C   ??? HTN (hypertension)    ??? Lung nodule, multiple     (CT 10/2016) Small left upper lobe and 1.7 x 1.6 cm left lower lobe nodules not significantly changed from 07/23/2016 and 03/22/2016   ??? Marijuana use    ??? PTSD (post-traumatic stress disorder)     lived [place]   ??? Thoracic ascending aortic aneurysm ([place])     4.4cm noted on CT Chest (10/2016)   ??? Thyroid nodule     2.5 cm stable right thyroid nodule (CT 10/2016)     Past Surgical History:   Procedure Laterality Date   ??? HX ENDOSCOPY     ??? HX HIP REPLACEMENT Right 01/29/2015   ??? HX HIP REPLACEMENT     ??? HX HYSTERECTOMY  2010    hysterectomy       Social History     Social History     Socioeconomic History   ??? Marital status: MARRIED     Spouse name: Not on file   ??? Number of children: Not on file   ??? Years of education: Not on file   ??? Highest education level: Not on file   Tobacco  Use   ??? Smoking status: Former Smoker   ??? Smokeless tobacco: Never Used   ??? Tobacco comment: reported as quit smoking around 1990s per spouse   Substance and Sexual Activity   ??? Alcohol use: No   ??? Drug use: Yes     Types: Marijuana   ??? Sexual activity: Never       Family History     Family History   Problem Relation Age of Onset   ??? Hypertension Mother    ??? Other Mother         OSA   ??? Cancer Father         suspected leukemia per pt's spouse   ??? Cancer Maternal Aunt    ??? Alcohol abuse Maternal Grandfather        Current Medications     Prior to Admission Medications   Prescriptions Last Dose Informant Patient Reported? Taking?   Blood-Glucose Meter monitoring kit   No No   Sig: Check sugars 2 times per day, fasting and 2 hours after dinner   Lancets misc   No No   Sig: Check sugars twice daily   QUEtiapine SR (SEROQUEL XR) 150 mg sr tablet   No No   Sig: TAKE 1 TABLET BY MOUTH NIGHTLY   albuterol (ACCUNEB) 1.25 mg/3 mL nebu   No No   Sig: 3 mL by Nebulization route every six (6)  hours as needed.   albuterol (PROVENTIL HFA, VENTOLIN HFA, PROAIR HFA) 90 mcg/actuation inhaler   No No   Sig: Take 2 Puffs by inhalation every six (6) hours for 5 days, THEN 2 Puffs every four (4) hours as needed for Wheezing or Shortness of Breath for up to 30 days.   amLODIPine (NORVASC) 10 mg tablet   No No   Sig: TAKE ONE TABLET BY MOUTH ONCE DAILY   cloNIDine HCl (CATAPRES) 0.3 mg tablet   No No   Sig: Take bid   Patient taking differently: Take 1 Tab by mouth two (2) times a day. Take bid   fluticasone propion-salmeterol (ADVAIR DISKUS) 100-50 mcg/dose diskus inhaler   No No   Sig: Take 1 Puff by inhalation two (2) times a day.   gabapentin (NEURONTIN) 100 mg capsule   Yes No   Sig: Take 600 mg by mouth three (3) times daily.   glucose blood VI test strips (ASCENSIA AUTODISC VI, ONE TOUCH ULTRA TEST VI) strip   No No   Sig: Check sugars twice daily   losartan (COZAAR) 100 mg tablet   No No   Sig: TAKE 1 TABLET BY MOUTH DAILY FOR HYPERTENSION   magnesium oxide (MAG-OX) 400 mg tablet   No No   Sig: TAKE 1 TABLET BY MOUTH DAILY   metFORMIN ER (GLUCOPHAGE XR) 750 mg tablet   No No   Sig: Take 1 Tab by mouth two (2) times a day.   montelukast (SINGULAIR) 10 mg tablet   No No   Sig: TAKE 1 TABLET BY MOUTH DAILY   nabumetone (RELAFEN) 750 mg tablet   No No   Sig: TAKE 1 TABLET BY MOUTH TWICE DAILY AS NEEDED FOR PAIN. USE SPARINGLY   polyethylene glycol (MIRALAX) 17 gram packet   No No   Sig: Take 1 Packet by mouth daily.   rosuvastatin (CRESTOR) 5 mg tablet   No No   Sig: TAKE 1 TABLET BY MOUTH NIGHTLY   spironolactone (ALDACTONE) 100 mg tablet   No No  Sig: TAKE 1 TABLET BY MOUTH DAILY   tiotropium bromide (SPIRIVA RESPIMAT) 2.5 mcg/actuation inhaler   No No   Sig: Take 1 Puff by inhalation daily.   traZODone (DESYREL) 50 mg tablet   No No   Sig: Take 1 Tab by mouth nightly as needed for Sleep.   triamcinolone (NASACORT) 55 mcg nasal inhaler   No No   Sig: 2 Sprays by Both Nostrils route daily.       Facility-Administered Medications: None       Allergies     Allergies   Allergen Reactions   ??? Chocolate [Cocoa] Sneezing       Physical Exam/ED Course / Medical Decision Making     ED Triage Vitals   Enc Vitals Group      BP 11/14/17 1930 (!) 127/101      Pulse (Heart Rate) 11/14/17 1944 (!) 133      Resp Rate 11/14/17 1944 25      Temp 11/14/17 1957 98.1 ??F (36.7 ??C)      Temp src --       O2 Sat (%) 11/14/17 1922 (!) 86 %      Weight 11/14/17 2002 190 lb      Height 11/14/17 2002 '5\' 10"'$       Head Circumference --       Peak Flow --       Pain Score --       Pain Loc --       Pain Edu? --       Excl. in [place]? --        Physical exam:  General:  Well-developed, well-nourished, marked respiratory distress unable to speak wheezing all lung fields, bronchospastic cough tachypneic nondiaphoretic.    Head:  Normocephalic atraumatic.    Eyes:  Pupils midrange extraocular movements intact.  No pallor or conjunctival injection.    Nose:  No rhinorrhea, inspection grossly normal.    Ears:  Grossly normal to inspection, no discharge.    Mouth:  Mucous membranes moist, no appreciable intraoral lesion.    Neck/Back:  Trachea midline, no asymmetry.  No JVD  Chest:  Grossly normal inspection, symmetric chest rise.    Pulmonary: Wheezing all lung fields poor air movement.    Cardiovascular:  S1-S2 no murmurs rubs or gallops.    Abdomen: Soft, nontender, nondistended no guarding rebound or peritoneal signs.    Extremities:  Grossly normal to inspection, peripheral pulses intact  no pain on palpation of the calves no asymmetry  Neurologic:  Alert and oriented no appreciable focal neurologic deficit    Skin:  Warm and dry  Nursing note reviewed, vital signs reviewed.    ED course:  Patient presents in respiratory distress, seems to be severe COPD exacerbation, she has no pain complaint but given the acute of her presentation we will still rule out ACS pneumonia or other infectious process, given her marked respiratory distress we  will place her immediately on BiPAP ABG and continue neb wiser treatments and Solu-Medrol and likely admit for further management    EKG done at 2029 hrs. sinus tachycardia heart rate 103 intervals within normal limits no ST changes no ectopy.  Isolated T wave inversion in 3 and V6 nonspecific.    Patient revived by myself after being on BiPAP, now able speak in long sentences much more comfortable-appearing    Chest x-ray groundglass opacity likely atelectasis    Labs:  white blood cell count of 22.4 given the  chest x-ray findings and leukocytosis, now suspicious for pneumonia at 2041, started broad-spectrum antibiotics, blood culture lactic acid and plan for admission troponin and BNP negative chemistry unremarkable    ABG 7.327/40 6/159 this is respiratory acidosis      Patient's presentation, history, physical exam and laboratory evaluations were reviewed.  I felt the patient would benefit from inpatient management and treatment.     Consult:  Discussed care with Dr. Winona Legato. Standard discussion; including history of patient???s chief complaint, available diagnostic results, and treatment course.  Patient was accepted to their service.          Disposition:    Admitted to ICU      Portions of this chart were created with Dragon medical speech to text program.   Unrecognized errors may be present.    I have spent 40 minutes of critical care time (excluding time for other separate services) involved in lab review, consultations with specialist, family decision-making, and documentation.  During this entire length of time I was immediately available to the patient.    Critical Care:  The reason for providing this level of medical care for this critically ill patient was due a critical illness that impaired one or more vital organ systems such that there was a high probability of imminent or life threatening deterioration in the patients condition. This care involved high complexity decision making to assess,  manipulate, and support vital system functions, to treat this degree of vital organ system failure and to prevent further life threatening deterioration of the patient???s condition.           Final Diagnosis   No diagnosis found.      Diagnostic Studies   Lab:   Recent Results (from the past 12 hour(s))   CBC WITH AUTOMATED DIFF    Collection Time: 11/14/17  7:28 PM   Result Value Ref Range    WBC 22.4 (H) 4.0 - 11.0 1000/mm3    NEUTROPHILS 50.5 34 - 64 %    LYMPHOCYTES 42.9 28 - 48 %    RBC 4.70 3.60 - 5.20 M/uL    MONOCYTES 4.8 1 - 13 %    HGB 13.9 13.0 - 17.2 gm/dl    HCT 41.2 37.0 - 50.0 %    BASOPHILS 0.9 0 - 3 %    MCV 87.7 80.0 - 98.0 fL    MCH 29.6 25.4 - 34.6 pg    MCHC 33.7 30.0 - 36.0 gm/dl    PLATELET 398 140 - 450 1000/mm3    MPV 11.7 (H) 6.0 - 10.0 fL    ATYPICAL LYMPHS 0.9 (H) 0 - 0 %    RDW-SD 42.4 36.4 - 46.3      NRBC 0 0 - 0     METABOLIC PANEL, BASIC    Collection Time: 11/14/17  7:28 PM   Result Value Ref Range    Sodium 134 (L) 136 - 145 mEq/L    Potassium 4.6 3.5 - 5.1 mEq/L    Chloride 99 98 - 107 mEq/L    CO2 26 21 - 32 mEq/L    Glucose 379 (H) 74 - 106 mg/dl    BUN 20 7 - 25 mg/dl    Creatinine 1.3 0.6 - 1.3 mg/dl    GFR est AA 53.0      GFR est non-AA 44      Calcium 9.8 8.5 - 10.1 mg/dl    Anion gap 9 5 - 15 mmol/L   TROPONIN I  Collection Time: 11/14/17  7:28 PM   Result Value Ref Range    Troponin-I <0.015 0.000 - 0.045 ng/ml   NT-PRO BNP    Collection Time: 11/14/17  7:28 PM   Result Value Ref Range    NT pro-BNP 33.0 0.0 - 125.0 pg/ml   POC BLOOD GAS + LACTIC ACID    Collection Time: 11/14/17  8:42 PM   Result Value Ref Range    pH 7.327 (L) 7.350 - 7.450      PCO2 46.5 (H) 35.0 - 45.0 mm Hg    PO2 159.0 (H) 75 - 100 mm Hg    BICARBONATE 24.5 18.0 - 26.0 mmol/L    O2 SAT 99.0 90 - 100 %    CO2, TOTAL 26.0 24 - 29 mmol/L    Lactic Acid 3.48 (H) 0.40 - 2.00 mmol/L    BASE EXCESS -2 -2 - 3 mmol/L    Sample type Art      FIO2 40      SITE R Radial      DEVICE BiPAP      ALLENS TEST Pass       IPAP/PIP 18      EPAP/CPAP/PEEP 8      Respiratory Rate 18      SET RATE 12      VTEXP 605         Imaging:    Xr Chest Sngl V    Result Date: 11/14/2017  Indication: dyspnea.    .     IMPRESSION: Portable AP upright view the chest exposed at 8:13 PM November 14, 2017 reveals patchy infiltrates at the lung bases with groundglass granularity consistent with emphysema and atelectasis similar to September 13, 2017. The heart is of normal size.                  Medications   sodium chloride 0.9 % bolus infusion 1,000 mL (has no administration in time range)   sodium chloride 0.9 % bolus infusion 1,000 mL (has no administration in time range)   cefTRIAXone (ROCEPHIN) 1 g in 0.9% sodium chloride (MBP/ADV) 50 mL MBP (has no administration in time range)   azithromycin (ZITHROMAX) 500 mg in 0.9% sodium chloride 250 mL IVPB (has no administration in time range)   albuterol-ipratropium (DUO-NEB) 2.5 MG-0.5 MG/3 ML (3 mL Nebulization Given 11/14/17 1956)   methylPREDNISolone (PF) (Solu-MEDROL) injection 125 mg (125 mg IntraVENous Given 11/14/17 1953)   albuterol (PROVENTIL VENTOLIN) nebulizer solution 5 mg (5 mg Nebulization Given 11/14/17 1956)   sodium chloride 0.9 % bolus infusion 1,000 mL (1,000 mL IntraVENous New Bag 11/14/17 1954)          Results of lab/radiology tests were discussed with the patient. . All questions were answered and concerns addressed.        My signature above authenticates this document and my orders, the final ??  diagnosis (es), discharge prescription (s), and instructions in the Epic record.  If you have any questions please contact [phone].  ??

## 2017-11-15 ENCOUNTER — Encounter: Primary: Physician Assistant

## 2017-11-15 LAB — CBC WITH AUTO DIFFERENTIAL
Atypical Lymphocytes: 0.9 % — ABNORMAL HIGH (ref 0–0)
Basophils %: 0.9 % (ref 0–3)
Basophils %: 0.1 % (ref 0–3)
Eosinophils %: 0 % (ref 0–5)
Hematocrit: 41.2 % (ref 37.0–50.0)
Hematocrit: 37.7 % (ref 37.0–50.0)
Hemoglobin: 13.9 gm/dl (ref 13.0–17.2)
Hemoglobin: 12.2 gm/dl — ABNORMAL LOW (ref 13.0–17.2)
Immature Granulocytes: 0.4 % (ref 0.0–3.0)
Lymphocytes %: 42.9 % (ref 28–48)
Lymphocytes %: 7.6 % — ABNORMAL LOW (ref 28–48)
MCH: 29.6 pg (ref 25.4–34.6)
MCH: 28.4 pg (ref 25.4–34.6)
MCHC: 33.7 gm/dl (ref 30.0–36.0)
MCHC: 32.4 gm/dl (ref 30.0–36.0)
MCV: 87.7 fL (ref 80.0–98.0)
MCV: 87.9 fL (ref 80.0–98.0)
MPV: 11.7 fL — ABNORMAL HIGH (ref 6.0–10.0)
MPV: 11 fL — ABNORMAL HIGH (ref 6.0–10.0)
Monocytes %: 4.8 % (ref 1–13)
Monocytes %: 1 % (ref 1–13)
Neutrophils %: 50.5 % (ref 34–64)
Neutrophils %: 90.9 % — ABNORMAL HIGH (ref 34–64)
Nucleated RBCs: 0 (ref 0–0)
Nucleated RBCs: 0 (ref 0–0)
Platelets: 398 10*3/uL (ref 140–450)
Platelets: 258 10*3/uL (ref 140–450)
RBC: 4.7 M/uL (ref 3.60–5.20)
RBC: 4.29 M/uL (ref 3.60–5.20)
RDW-SD: 42.4 (ref 36.4–46.3)
RDW-SD: 41.9 (ref 36.4–46.3)
WBC: 22.4 10*3/uL — ABNORMAL HIGH (ref 4.0–11.0)
WBC: 10.9 10*3/uL (ref 4.0–11.0)

## 2017-11-15 LAB — EKG 12-LEAD
Atrial Rate: 104 {beats}/min
P Axis: 82 degrees
P-R Interval: 158 ms
Q-T Interval: 338 ms
QRS Duration: 68 ms
QTc Calculation (Bazett): 444 ms
R Axis: 42 degrees
T Axis: 43 degrees
Ventricular Rate: 104 {beats}/min

## 2017-11-15 LAB — POC BLOOD GAS + LACTIC ACID
BASE EXCESS: -2 mmol/L (ref <=3)
BICARBONATE: 24.5 mmol/L (ref 18.0–26.0)
Base Excess: -2 mmol/L (ref <=3)
CO2 Total: 26 mmol/L (ref 24–29)
CO2, TOTAL: 26 mmol/L (ref 24–29)
EPAP/CPAP/PEEP: 8
EPAP/CPAP/PEEP: 8
FIO2: 40
FIO2: 40
HCO3: 24.5 mmol/L (ref 18.0–26.0)
IPAP/PIP: 18
IPAP/PIP: 18
LACTIC ACID: 3.48 mmol/L — ABNORMAL HIGH (ref 0.40–2.00)
Lactic Acid: 3.48 mmol/L — ABNORMAL HIGH (ref 0.40–2.00)
O2 SAT: 99 % (ref 90–100)
O2 Sat: 99 % (ref 90–100)
PCO2: 46.5 mm Hg — ABNORMAL HIGH (ref 35.0–45.0)
PCO2: 46.5 mm Hg — ABNORMAL HIGH (ref 35.0–45.0)
PO2: 159 mm Hg — ABNORMAL HIGH (ref 75–100)
PO2: 159 mm Hg — ABNORMAL HIGH (ref 75–100)
RESPIRATORY RATE: 18
Respiratory Rate: 18
SET RATE: 12
Set Rate: 12
VTEXP, VTEXP: 605
VTEXP: 605
pH: 7.327 — ABNORMAL LOW (ref 7.350–7.450)
pH: 7.327 — ABNORMAL LOW (ref 7.350–7.450)

## 2017-11-15 LAB — BASIC METABOLIC PANEL
Anion Gap: 9 mmol/L (ref 5–15)
Anion Gap: 9 mmol/L (ref 5–15)
BUN: 20 mg/dl (ref 7–25)
BUN: 17 mg/dl (ref 7–25)
CO2: 26 mEq/L (ref 21–32)
CO2: 24 mEq/L (ref 21–32)
Calcium: 9.8 mg/dl (ref 8.5–10.1)
Calcium: 9.3 mg/dl (ref 8.5–10.1)
Chloride: 99 mEq/L (ref 98–107)
Chloride: 104 mEq/L (ref 98–107)
Creatinine: 1.3 mg/dl (ref 0.6–1.3)
Creatinine: 1.1 mg/dl (ref 0.6–1.3)
EGFR IF NonAfrican American: 44
EGFR IF NonAfrican American: 53
GFR African American: 53
GFR African American: >60
Glucose: 379 mg/dl — ABNORMAL HIGH (ref 74–106)
Glucose: 319 mg/dl — ABNORMAL HIGH (ref 74–106)
Potassium: 4.6 mEq/L (ref 3.5–5.1)
Potassium: 5 mEq/L (ref 3.5–5.1)
Sodium: 134 mEq/L — ABNORMAL LOW (ref 136–145)
Sodium: 137 mEq/L (ref 136–145)

## 2017-11-15 LAB — PROBNP, N-TERMINAL: BNP: 33 pg/ml (ref 0.0–125.0)

## 2017-11-15 LAB — TROPONIN
Troponin I: <0.015 ng/ml (ref 0.000–0.045)
Troponin I: <0.015 ng/ml (ref 0.000–0.045)
Troponin I: <0.015 ng/ml (ref 0.000–0.045)
Troponin I: <0.015 ng/ml (ref 0.000–0.045)

## 2017-11-15 LAB — PROCALCITONIN
PROCALCITONIN: <0.05 ng/ml (ref 0.00–0.50)
PROCALCITONIN: <0.05 ng/ml (ref 0.00–0.50)

## 2017-11-15 LAB — POCT GLUCOSE
POC Glucose: 313 mg/dL — ABNORMAL HIGH (ref 65–105)
POC Glucose: 320 mg/dL — ABNORMAL HIGH (ref 65–105)
POC Glucose: 298 mg/dL — ABNORMAL HIGH (ref 65–105)
POC Glucose: 296 mg/dL — ABNORMAL HIGH (ref 65–105)

## 2017-11-15 LAB — LACTIC ACID
LACTIC ACID: 4 mmol/L (ref 0.4–2.0)
Lactic Acid: 4 mmol/L — CR (ref 0.4–2.0)

## 2017-11-15 LAB — LEGIONELLA ANTIGEN, URINE: Legionella Antigen, Urine: NEGATIVE

## 2017-11-15 LAB — S.PNEUMO AG, UR/CSF
Strep pneumo Ag, urine: NEGATIVE
Strep pneumo Ag, urine: NEGATIVE

## 2017-11-15 LAB — EKG, 12 LEAD, INITIAL
Atrial Rate: 104 {beats}/min
Calculated P Axis: 82 degrees
Calculated R Axis: 42 degrees
Calculated T Axis: 43 degrees
P-R Interval: 158 ms
Q-T Interval: 338 ms
QRS Duration: 68 ms
QTC Calculation (Bezet): 444 ms
Ventricular Rate: 104 {beats}/min

## 2017-11-15 LAB — CBC WITH AUTOMATED DIFF
ATYPICAL LYMPHS: 0.9 % — ABNORMAL HIGH (ref 0–0)
BASOPHILS: 0.9 % (ref 0–3)
BASOPHILS: 0.1 % (ref 0–3)
EOSINOPHILS: 0 % (ref 0–5)
HCT: 41.2 % (ref 37.0–50.0)
HCT: 37.7 % (ref 37.0–50.0)
HGB: 13.9 gm/dl (ref 13.0–17.2)
HGB: 12.2 gm/dl — ABNORMAL LOW (ref 13.0–17.2)
IMMATURE GRANULOCYTES: 0.4 % (ref 0.0–3.0)
LYMPHOCYTES: 42.9 % (ref 28–48)
LYMPHOCYTES: 7.6 % — ABNORMAL LOW (ref 28–48)
MCH: 29.6 pg (ref 25.4–34.6)
MCH: 28.4 pg (ref 25.4–34.6)
MCHC: 33.7 gm/dl (ref 30.0–36.0)
MCHC: 32.4 gm/dl (ref 30.0–36.0)
MCV: 87.7 fL (ref 80.0–98.0)
MCV: 87.9 fL (ref 80.0–98.0)
MONOCYTES: 4.8 % (ref 1–13)
MONOCYTES: 1 % (ref 1–13)
MPV: 11.7 fL — ABNORMAL HIGH (ref 6.0–10.0)
MPV: 11 fL — ABNORMAL HIGH (ref 6.0–10.0)
NEUTROPHILS: 50.5 % (ref 34–64)
NEUTROPHILS: 90.9 % — ABNORMAL HIGH (ref 34–64)
NRBC: 0 (ref 0–0)
NRBC: 0 (ref 0–0)
PLATELET: 398 10*3/uL (ref 140–450)
PLATELET: 258 10*3/uL (ref 140–450)
RBC: 4.7 M/uL (ref 3.60–5.20)
RBC: 4.29 M/uL (ref 3.60–5.20)
RDW-SD: 42.4 (ref 36.4–46.3)
RDW-SD: 41.9 (ref 36.4–46.3)
WBC: 22.4 10*3/uL — ABNORMAL HIGH (ref 4.0–11.0)
WBC: 10.9 10*3/uL (ref 4.0–11.0)

## 2017-11-15 LAB — METABOLIC PANEL, BASIC
Anion gap: 9 mmol/L (ref 5–15)
Anion gap: 9 mmol/L (ref 5–15)
BUN: 20 mg/dl (ref 7–25)
BUN: 17 mg/dl (ref 7–25)
CO2: 26 mEq/L (ref 21–32)
CO2: 24 mEq/L (ref 21–32)
Calcium: 9.8 mg/dl (ref 8.5–10.1)
Calcium: 9.3 mg/dl (ref 8.5–10.1)
Chloride: 99 mEq/L (ref 98–107)
Chloride: 104 mEq/L (ref 98–107)
Creatinine: 1.3 mg/dl (ref 0.6–1.3)
Creatinine: 1.1 mg/dl (ref 0.6–1.3)
GFR est AA: 53
GFR est AA: >60
GFR est non-AA: 44
GFR est non-AA: 53
Glucose: 379 mg/dl — ABNORMAL HIGH (ref 74–106)
Glucose: 319 mg/dl — ABNORMAL HIGH (ref 74–106)
Potassium: 4.6 mEq/L (ref 3.5–5.1)
Potassium: 5 mEq/L (ref 3.5–5.1)
Sodium: 134 mEq/L — ABNORMAL LOW (ref 136–145)
Sodium: 137 mEq/L (ref 136–145)

## 2017-11-15 LAB — TROPONIN I
Troponin-I: <0.015 ng/ml (ref 0.000–0.045)
Troponin-I: <0.015 ng/ml (ref 0.000–0.045)
Troponin-I: <0.015 ng/ml (ref 0.000–0.045)
Troponin-I: <0.015 ng/ml (ref 0.000–0.045)

## 2017-11-15 LAB — GLUCOSE, POC
Glucose (POC): 313 mg/dL — ABNORMAL HIGH (ref 65–105)
Glucose (POC): 320 mg/dL — ABNORMAL HIGH (ref 65–105)
Glucose (POC): 298 mg/dL — ABNORMAL HIGH (ref 65–105)
Glucose (POC): 296 mg/dL — ABNORMAL HIGH (ref 65–105)

## 2017-11-15 LAB — NT-PRO BNP: NT pro-BNP: 33 pg/ml (ref 0.0–125.0)

## 2017-11-15 LAB — LEGIONELLA PNEUMOPHILA AG, URINE: Legionella Ag, urine: NEGATIVE

## 2017-11-15 MED ORDER — LOSARTAN 50 MG TAB
50 mg | Freq: Every day | ORAL | Status: DC
Start: 2017-11-15 — End: 2017-11-16

## 2017-11-15 MED ORDER — ELECTROLYTE REPLACEMENT PROTOCOL
Status: DC | PRN
Start: 2017-11-15 — End: 2017-11-18

## 2017-11-15 MED ORDER — ROSUVASTATIN 20 MG TAB
20 mg | Freq: Every evening | ORAL | Status: DC
Start: 2017-11-15 — End: 2017-11-18
  Administered 2017-11-15 – 2017-11-18 (×4): via ORAL

## 2017-11-15 MED ORDER — TIOTROPIUM BROMIDE 2.5 MCG/ACTUATION MIST FOR INHALATION
2.5 mcg/actuation | Freq: Every day | RESPIRATORY_TRACT | Status: DC
Start: 2017-11-15 — End: 2017-11-18
  Administered 2017-11-15 – 2017-11-18 (×4): via RESPIRATORY_TRACT

## 2017-11-15 MED ORDER — DILTIAZEM HCL 5 MG/ML IV SOLN
5 mg/mL | INTRAVENOUS | Status: DC
Start: 2017-11-15 — End: 2017-11-16
  Administered 2017-11-15 – 2017-11-16 (×3): via INTRAVENOUS

## 2017-11-15 MED ORDER — LORAZEPAM 2 MG/ML IJ SOLN
2 mg/mL | Freq: Four times a day (QID) | INTRAMUSCULAR | Status: DC | PRN
Start: 2017-11-15 — End: 2017-11-18
  Administered 2017-11-15 (×3): via INTRAVENOUS

## 2017-11-15 MED ORDER — SODIUM CHLORIDE 0.9 % IV
500 mg | INTRAVENOUS | Status: DC
Start: 2017-11-15 — End: 2017-11-18
  Administered 2017-11-15 – 2017-11-18 (×4): via INTRAVENOUS

## 2017-11-15 MED ORDER — CEFTRIAXONE 1 GRAM SOLUTION FOR INJECTION
1 gram | INTRAMUSCULAR | Status: DC
Start: 2017-11-15 — End: 2017-11-17
  Administered 2017-11-16 – 2017-11-17 (×2): via INTRAVENOUS

## 2017-11-15 MED ORDER — SODIUM CHLORIDE 0.9% BOLUS IV
0.9 % | INTRAVENOUS | Status: AC
Start: 2017-11-15 — End: 2017-11-14
  Administered 2017-11-15: 02:00:00 via INTRAVENOUS

## 2017-11-15 MED ORDER — METHYLPREDNISOLONE (PF) 40 MG/ML IJ SOLR
40 mg/mL | Freq: Four times a day (QID) | INTRAMUSCULAR | Status: DC
Start: 2017-11-15 — End: 2017-11-18
  Administered 2017-11-15 – 2017-11-18 (×14): via INTRAVENOUS

## 2017-11-15 MED ORDER — MONTELUKAST 10 MG TAB
10 mg | Freq: Every evening | ORAL | Status: DC
Start: 2017-11-15 — End: 2017-11-18
  Administered 2017-11-15 – 2017-11-18 (×4): via ORAL

## 2017-11-15 MED ORDER — LOSARTAN 50 MG TAB
50 mg | Freq: Every day | ORAL | Status: DC
Start: 2017-11-15 — End: 2017-11-14

## 2017-11-15 MED ORDER — CLONIDINE 0.2 MG TAB
0.2 mg | Freq: Two times a day (BID) | ORAL | Status: DC
Start: 2017-11-15 — End: 2017-11-18
  Administered 2017-11-16 – 2017-11-18 (×6): via ORAL

## 2017-11-15 MED ORDER — MAGNESIUM SULFATE 2 GRAM/50 ML IVPB
2 gram/50 mL (4 %) | INTRAVENOUS | Status: AC
Start: 2017-11-15 — End: 2017-11-15
  Administered 2017-11-15: 11:00:00 via INTRAVENOUS

## 2017-11-15 MED ORDER — INSULIN LISPRO 100 UNIT/ML INJECTION
100 unit/mL | SUBCUTANEOUS | Status: DC | PRN
Start: 2017-11-15 — End: 2017-11-18
  Administered 2017-11-15 – 2017-11-18 (×5): via SUBCUTANEOUS

## 2017-11-15 MED ORDER — SODIUM CHLORIDE 0.9 % IV
INTRAVENOUS | Status: DC
Start: 2017-11-15 — End: 2017-11-15
  Administered 2017-11-15: 04:00:00 via INTRAVENOUS

## 2017-11-15 MED ORDER — CLONIDINE 0.1 MG TAB
0.1 mg | Freq: Two times a day (BID) | ORAL | Status: DC
Start: 2017-11-15 — End: 2017-11-14

## 2017-11-15 MED ORDER — FLUTICASONE 200 MCG-VILANTEROL 25 MCG/DOSE BREATH ACTIVATED INHALER
200-25 mcg/dose | Freq: Every day | RESPIRATORY_TRACT | Status: DC
Start: 2017-11-15 — End: 2017-11-18
  Administered 2017-11-15 – 2017-11-18 (×4): via RESPIRATORY_TRACT

## 2017-11-15 MED ORDER — FLUTICASONE 50 MCG/ACTUATION NASAL SPRAY, SUSP
50 mcg/actuation | Freq: Every day | NASAL | Status: DC
Start: 2017-11-15 — End: 2017-11-18
  Administered 2017-11-16 – 2017-11-18 (×3): via NASAL

## 2017-11-15 MED ORDER — SODIUM CHLORIDE 0.9% BOLUS IV
0.9 % | INTRAVENOUS | Status: AC
Start: 2017-11-15 — End: 2017-11-14
  Administered 2017-11-15: 03:00:00 via INTRAVENOUS

## 2017-11-15 MED ORDER — LEVALBUTEROL 1.25 MG/3 ML SOLN FOR INHALATION
1.25 mg/3 mL | RESPIRATORY_TRACT | Status: DC
Start: 2017-11-15 — End: 2017-11-16
  Administered 2017-11-15 – 2017-11-16 (×6): via RESPIRATORY_TRACT

## 2017-11-15 MED ORDER — AMLODIPINE 5 MG TAB
5 mg | Freq: Every day | ORAL | Status: DC
Start: 2017-11-15 — End: 2017-11-14

## 2017-11-15 MED ORDER — SODIUM CHLORIDE 0.9 % IV PIGGY BACK
1 gram | INTRAVENOUS | Status: AC
Start: 2017-11-15 — End: 2017-11-14
  Administered 2017-11-15: 02:00:00 via INTRAVENOUS

## 2017-11-15 MED ORDER — TRAZODONE 50 MG TAB
50 mg | Freq: Every evening | ORAL | Status: DC | PRN
Start: 2017-11-15 — End: 2017-11-18
  Administered 2017-11-16 – 2017-11-18 (×2): via ORAL

## 2017-11-15 MED ORDER — GUAIFENESIN 100 MG/5 ML ORAL LIQUID
100 mg/5 mL | ORAL | Status: DC | PRN
Start: 2017-11-15 — End: 2017-11-18
  Administered 2017-11-15: 22:00:00 via ORAL

## 2017-11-15 MED ORDER — DEXTROSE 50% IN WATER (D50W) IV
INTRAVENOUS | Status: DC | PRN
Start: 2017-11-15 — End: 2017-11-18

## 2017-11-15 MED ORDER — ACETAMINOPHEN 325 MG TABLET
325 mg | Freq: Four times a day (QID) | ORAL | Status: DC | PRN
Start: 2017-11-15 — End: 2017-11-18
  Administered 2017-11-15: 04:00:00 via ORAL

## 2017-11-15 MED ORDER — POLYETHYLENE GLYCOL 3350 17 GRAM (100 %) ORAL POWDER PACKET
17 gram | Freq: Every day | ORAL | Status: DC
Start: 2017-11-15 — End: 2017-11-18
  Administered 2017-11-16 – 2017-11-17 (×2): via ORAL

## 2017-11-15 MED ORDER — ALBUTEROL SULFATE 0.083 % (0.83 MG/ML) SOLN FOR INHALATION
2.5 mg /3 mL (0.083 %) | RESPIRATORY_TRACT | Status: DC | PRN
Start: 2017-11-15 — End: 2017-11-15
  Administered 2017-11-15 (×2): via RESPIRATORY_TRACT

## 2017-11-15 MED ORDER — INSULIN GLARGINE 100 UNIT/ML INJECTION
100 unit/mL | Freq: Every evening | SUBCUTANEOUS | Status: DC
Start: 2017-11-15 — End: 2017-11-18
  Administered 2017-11-15 – 2017-11-18 (×4): via SUBCUTANEOUS

## 2017-11-15 MED ORDER — MAGNESIUM OXIDE 400 MG TAB
400 mg | Freq: Every day | ORAL | Status: DC
Start: 2017-11-15 — End: 2017-11-18
  Administered 2017-11-16 – 2017-11-18 (×3): via ORAL

## 2017-11-15 MED ORDER — BENZONATATE 100 MG CAP
100 mg | Freq: Three times a day (TID) | ORAL | Status: DC | PRN
Start: 2017-11-15 — End: 2017-11-18
  Administered 2017-11-15: 22:00:00 via ORAL

## 2017-11-15 MED ORDER — INSULIN LISPRO 100 UNIT/ML INJECTION
100 unit/mL | Freq: Four times a day (QID) | SUBCUTANEOUS | Status: DC
Start: 2017-11-15 — End: 2017-11-18
  Administered 2017-11-15 – 2017-11-18 (×13): via SUBCUTANEOUS

## 2017-11-15 MED ORDER — DILTIAZEM 250 MG/250 ML (1 MG/ML) IN DEXTROSE 5 % IV
1 mg/mL | INTRAVENOUS | Status: DC
Start: 2017-11-15 — End: 2017-11-15

## 2017-11-15 MED ORDER — GLUCAGON 1 MG INJECTION
1 mg | INTRAMUSCULAR | Status: DC | PRN
Start: 2017-11-15 — End: 2017-11-18

## 2017-11-15 MED ORDER — HEPARIN (PORCINE) 5,000 UNIT/ML IJ SOLN
5000 unit/mL | Freq: Three times a day (TID) | INTRAMUSCULAR | Status: DC
Start: 2017-11-15 — End: 2017-11-18
  Administered 2017-11-15 – 2017-11-18 (×11): via SUBCUTANEOUS

## 2017-11-15 MED ORDER — AZITHROMYCIN 500 MG IV SOLUTION
500 mg | Freq: Once | INTRAVENOUS | Status: DC
Start: 2017-11-15 — End: 2017-11-14
  Administered 2017-11-15: 01:00:00 via INTRAVENOUS

## 2017-11-15 MED ORDER — ALBUTEROL SULFATE 1.25 MG/3 ML (0.042 %) SOLN FOR INHALATION
1.25 mg/3 mL | RESPIRATORY_TRACT | Status: DC | PRN
Start: 2017-11-15 — End: 2017-11-15

## 2017-11-15 MED ORDER — SPIRONOLACTONE 100 MG TAB
100 mg | Freq: Every day | ORAL | Status: DC
Start: 2017-11-15 — End: 2017-11-18
  Administered 2017-11-15 – 2017-11-18 (×4): via ORAL

## 2017-11-15 MED ORDER — QUETIAPINE SR 50 MG 24 HR TAB
50 mg | Freq: Every evening | ORAL | Status: DC
Start: 2017-11-15 — End: 2017-11-18
  Administered 2017-11-16 – 2017-11-18 (×3): via ORAL

## 2017-11-15 MED ORDER — CODEINE-GUAIFENESIN 10 MG-100 MG/5 ML ORAL LIQUID
100-10 mg/5 mL | ORAL | Status: DC | PRN
Start: 2017-11-15 — End: 2017-11-18

## 2017-11-15 MED ORDER — GABAPENTIN 300 MG CAP
300 mg | Freq: Three times a day (TID) | ORAL | Status: DC
Start: 2017-11-15 — End: 2017-11-14

## 2017-11-15 MED FILL — BREO ELLIPTA 200 MCG-25 MCG/DOSE POWDER FOR INHALATION: 200-25 mcg/dose | RESPIRATORY_TRACT | Qty: 28

## 2017-11-15 MED FILL — SPIRONOLACTONE 100 MG TAB: 100 mg | ORAL | Qty: 1

## 2017-11-15 MED FILL — ALBUTEROL SULFATE 1.25 MG/3 ML (0.042 %) SOLN FOR INHALATION: 1.25 mg/3 mL | RESPIRATORY_TRACT | Qty: 1

## 2017-11-15 MED FILL — AZITHROMYCIN 500 MG IV SOLUTION: 500 mg | INTRAVENOUS | Qty: 5

## 2017-11-15 MED FILL — LEVALBUTEROL 1.25 MG/3 ML SOLN FOR INHALATION: 1.25 mg/3 mL | RESPIRATORY_TRACT | Qty: 3

## 2017-11-15 MED FILL — SPIRIVA RESPIMAT 2.5 MCG/ACTUATION SOLUTION FOR INHALATION: 2.5 mcg/actuation | RESPIRATORY_TRACT | Qty: 4

## 2017-11-15 MED FILL — FLUTICASONE 50 MCG/ACTUATION NASAL SPRAY, SUSP: 50 mcg/actuation | NASAL | Qty: 16

## 2017-11-15 MED FILL — ALBUTEROL SULFATE 0.083 % (0.83 MG/ML) SOLN FOR INHALATION: 2.5 mg /3 mL (0.083 %) | RESPIRATORY_TRACT | Qty: 1

## 2017-11-15 MED FILL — ROSUVASTATIN 20 MG TAB: 20 mg | ORAL | Qty: 1

## 2017-11-15 MED FILL — SOLU-MEDROL (PF) 40 MG/ML SOLUTION FOR INJECTION: 40 mg/mL | INTRAMUSCULAR | Qty: 1

## 2017-11-15 MED FILL — ACETAMINOPHEN 325 MG TABLET: 325 mg | ORAL | Qty: 2

## 2017-11-15 MED FILL — HEPARIN (PORCINE) 5,000 UNIT/ML IJ SOLN: 5000 unit/mL | INTRAMUSCULAR | Qty: 1

## 2017-11-15 MED FILL — DILTIAZEM HCL 5 MG/ML IV SOLN: 5 mg/mL | INTRAVENOUS | Qty: 25

## 2017-11-15 MED FILL — MONTELUKAST 10 MG TAB: 10 mg | ORAL | Qty: 1

## 2017-11-15 MED FILL — CEFTRIAXONE 1 GRAM SOLUTION FOR INJECTION: 1 gram | INTRAMUSCULAR | Qty: 1

## 2017-11-15 MED FILL — ELECTROLYTE REPLACEMENT PROTOCOL: Qty: 1

## 2017-11-15 MED FILL — LORAZEPAM 2 MG/ML IJ SOLN: 2 mg/mL | INTRAMUSCULAR | Qty: 1

## 2017-11-15 MED FILL — SEROQUEL XR 50 MG TABLET,EXTENDED RELEASE: 50 mg | ORAL | Qty: 3

## 2017-11-15 MED FILL — BENZONATATE 100 MG CAP: 100 mg | ORAL | Qty: 1

## 2017-11-15 MED FILL — SODIUM CHLORIDE 0.9 % IV: INTRAVENOUS | Qty: 1000

## 2017-11-15 MED FILL — GUAIATUSSIN AC 10 MG-100 MG/5 ML ORAL LIQUID: 10-100 mg/5 mL | ORAL | Qty: 5

## 2017-11-15 MED FILL — GUAIFENESIN 100 MG/5 ML ORAL LIQUID: 100 mg/5 mL | ORAL | Qty: 10

## 2017-11-15 MED FILL — MAGNESIUM SULFATE 2 GRAM/50 ML IVPB: 2 gram/50 mL (4 %) | INTRAVENOUS | Qty: 50

## 2017-11-15 NOTE — Consults (Addendum)
[facility]     ICU Consult Note    Patient: [name] [name]               Sex: female          DOA: 11/14/2017       Date of Birth:  [birth year]      Age:  67 y.o.        LOS:  LOS: 1 day          Consult requested by Dr. Winona Legato  Reason for Consult: BiPAP  ASSESSMENT:   -Acute hypercapnic respiratory failure requiring BiPAP  -COPD/Asthma Exacerbation: procalcitonin <0.05  -Lactic acidosis: 3.48 on admission  -Hyperglycemia  -Bilateral bullous emphysema: Chest Xray revealed patchy infiltrates with ground glass granularity consistent with emphysema and atelectasis  -Stable LLL pulmonary nodule 18 mm x 90m  -Hypertension on Norvasc and Clonidine as an outpatient  -History of Hepatitis  -History of depression  -History of GERD    PLAN:   -Transitioned to NC Supplemental O2.  Titrate to maintain SaO2 >90%.  -Continue on PRN nebs and Solu-Medrol Q6 hours  -Continue on Breo, Singulair, and Spiriva  -Started on Ceftriaxone and Azithromax for empiric coverage.  Cultures pending.  Will de-escalate based on clinical progression and final culture results.  -Continue NS at 125 mL/hr  -Trend lactic acid level x3 sets  -Okay to advance diet to regular. Will d/c fluids once she is tolerating diet.  -Accuchecks AC/HS  -Insulin per [facility]comm[facility] protocol.  -Electrolytes and labs per replacement protocol.  -Heparin SQ for DVT prophylaxis  -CBC, BMP in am.     Explained assessment and plan to the patient and answered questions.   Discussed with Dr. DVolney American Discussed with the nurse and RT.  CCT: 33 min      HPI:   [name] BOOMis a 67y.o. female with a PMH of COPD/Asthma, HTN, DM who [facility] for evaluation of worsening shortness of breath.  The patient reports that she has a home nurse that comes out to see her twice a week and was unable to improve her condition with her rescue breathing treatments.  The patient denies any fevers, chills, cough, or other  respiratory symptoms.  In the ER, the patient was placed on BIPAP and given 2L IVF bolus, solu-medrol 125 mg, and multiple breathing treatments.  The patient was admitted to ICU with PCCM consult for further evaluation and management.     Past Medical History  Past Medical History:   Diagnosis Date   ??? Arthritis    ??? 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 COPD (chronic obstructive pulmonary disease) ([name] [name]    ??? DM2 (diabetes mellitus, type 2) ([name] [name]    ??? GERD (gastroesophageal reflux disease)    ??? Hepatitis C     HEP C   ??? HTN (hypertension)    ??? Lung nodule, multiple     (CT 10/2016) Small left upper lobe and 1.7 x 1.6 cm left lower lobe nodules not significantly changed from 07/23/2016 and 03/22/2016   ??? Marijuana use    ??? PTSD (post-traumatic stress disorder)     lived through the [name] [name] 1993   ??? Thoracic ascending aortic aneurysm ([name] [name]     4.4cm noted on CT Chest (10/2016)   ??? Thyroid nodule     2.5 cm stable right thyroid nodule (CT 10/2016)  Past Surgical History  Past Surgical History:   Procedure Laterality Date   ??? HX ENDOSCOPY     ??? HX HIP REPLACEMENT Right 01/29/2015   ??? HX HIP REPLACEMENT     ??? HX HYSTERECTOMY  2010    hysterectomy       Family History  Family History   Problem Relation Age of Onset   ??? Hypertension Mother    ??? Other Mother         OSA   ??? Cancer Father         suspected leukemia per pt's spouse   ??? Cancer Maternal Aunt    ??? Alcohol abuse Maternal Grandfather        Social History  Social History     Tobacco Use   ??? Smoking status: Former Smoker   ??? Smokeless tobacco: Never Used   ??? Tobacco comment: reported as quit smoking around 1990s per spouse   Substance Use Topics   ??? Alcohol use: No   ??? Drug use: Yes     Types: Marijuana        Medications  Prior to Admission medications    Medication Sig Start Date End Date Taking? Authorizing Provider   nabumetone (RELAFEN) 750 mg tablet TAKE 1 TABLET BY MOUTH TWICE DAILY AS  NEEDED FOR PAIN. USE SPARINGLY 11/10/17   Wallene Huh, PA-C   albuterol (PROVENTIL HFA, VENTOLIN HFA, PROAIR HFA) 90 mcg/actuation inhaler Take 2 Puffs by inhalation every six (6) hours for 5 days, THEN 2 Puffs every four (4) hours as needed for Wheezing or Shortness of Breath for up to 30 days. 10/11/17 11/15/17  Holladay, Lorra Hals, MD   tiotropium bromide (SPIRIVA RESPIMAT) 2.5 mcg/actuation inhaler Take 1 Puff by inhalation daily. 10/11/17   Holladay, Lorra Hals, MD   cloNIDine HCl (CATAPRES) 0.3 mg tablet Take bid  Patient taking differently: Take 1 Tab by mouth two (2) times a day. Take bid 10/11/17   Holladay, Lorra Hals, MD   fluticasone propion-salmeterol (ADVAIR DISKUS) 100-50 mcg/dose diskus inhaler Take 1 Puff by inhalation two (2) times a day. 10/11/17   Holladay, Lorra Hals, MD   metFORMIN ER (GLUCOPHAGE XR) 750 mg tablet Take 1 Tab by mouth two (2) times a day. 09/18/17   [name] Kluver, MD   gabapentin (NEURONTIN) 100 mg capsule Take 600 mg by mouth three (3) times daily.    Provider, Historical   spironolactone (ALDACTONE) 100 mg tablet TAKE 1 TABLET BY MOUTH DAILY 08/16/17   [name] Bis, FNP   montelukast (SINGULAIR) 10 mg tablet TAKE 1 TABLET BY MOUTH DAILY 08/16/17   Wallene Huh, PA-C   magnesium oxide (MAG-OX) 400 mg tablet TAKE 1 TABLET BY MOUTH DAILY 08/14/17   [name] Bis, FNP   traZODone (DESYREL) 50 mg tablet Take 1 Tab by mouth nightly as needed for Sleep. 08/09/17   [name] Grad, MD   losartan (COZAAR) 100 mg tablet TAKE 1 TABLET BY MOUTH DAILY FOR HYPERTENSION 07/17/17   [name] Bells, MD   QUEtiapine SR (SEROQUEL XR) 150 mg sr tablet TAKE 1 TABLET BY MOUTH NIGHTLY 07/13/17   Tharakan, Jolson K, MD   amLODIPine (NORVASC) 10 mg tablet TAKE ONE TABLET BY MOUTH ONCE DAILY 07/13/17   [name] Bells, MD   rosuvastatin (CRESTOR) 5 mg tablet TAKE 1 TABLET BY MOUTH NIGHTLY 07/13/17   Tharakan, [name] Alamo, MD   albuterol (ACCUNEB) 1.25 mg/3 mL nebu 3 mL by Nebulization route every six  (  6) hours as needed. 06/18/17   Edger House, MD   polyethylene glycol (MIRALAX) 17 gram packet Take 1 Packet by mouth daily. 05/09/17   [name] Bells, MD   triamcinolone (NASACORT) 55 mcg nasal inhaler 2 Sprays by Both Nostrils route daily. 02/22/17   Wallene Huh, PA-C   Blood-Glucose Meter monitoring kit Check sugars 2 times per day, fasting and 2 hours after dinner 07/15/15   [name] Bells, MD   Lancets misc Check sugars twice daily 07/15/15   [name] Bells, MD   glucose blood VI test strips (ASCENSIA AUTODISC VI, ONE TOUCH ULTRA TEST VI) strip Check sugars twice daily 07/15/15   [name] Bells, MD       Current Facility-Administered Medications   Medication Dose Route Frequency   ??? albuterol (PROVENTIL VENTOLIN) nebulizer solution 2.5 mg  2.5 mg Nebulization Q2H PRN   ??? cefTRIAXone (ROCEPHIN) 1 g in 0.9% sodium chloride (MBP/ADV) 50 mL MBP  1 g IntraVENous Q24H   ??? azithromycin (ZITHROMAX) 500 mg in 0.9% sodium chloride 250 mL IVPB  500 mg IntraVENous Q24H   ??? albuterol (ACCUNEB) nebulizer solution 1.25 mg  1.25 mg Nebulization Q4H PRN   ??? fluticasone-vilanterol (BREO ELLIPTA) 236mg-25mcg/puff  1 Puff Inhalation DAILY   ??? montelukast (SINGULAIR) tablet 10 mg  10 mg Oral QHS   ??? rosuvastatin (CRESTOR) tablet 20 mg  20 mg Oral QHS   ??? spironolactone (ALDACTONE) tablet 100 mg  100 mg Oral DAILY   ??? tiotropium bromide (SPIRIVA RESPIMAT) 2.5 mcg /actuation  2 Puff Inhalation DAILY   ??? acetaminophen (TYLENOL) tablet 650 mg  650 mg Oral Q6H PRN   ??? PHOSPHATE ELECTROLYTE REPLACEMENT PROTOCOL STANDARD DOSING  1 Each Other PRN   ??? MAGNESIUM ELECTROLYTE REPLACEMENT PROTOCOL STANDARD DOSING  1 Each Other PRN   ??? POTASSIUM ELECTROLYTE REPLACEMENT PROTOCOL STANDARD DOSING  1 Each Other PRN   ??? CALCIUM ELECTROLYTE REPLACEMENT PROTOCOL STANDARD DOSING  1 Each Other PRN   ??? heparin (porcine) injection 5,000 Units  5,000 Units SubCUTAneous Q8H    ??? methylPREDNISolone (PF) (SOLU-MEDROL) injection 40 mg  40 mg IntraVENous Q6H   ??? 0.9% sodium chloride infusion  125 mL/hr IntraVENous CONTINUOUS   ??? dextrose (D50) infusion 5-25 g  10-50 mL IntraVENous PRN   ??? glucagon (GLUCAGEN) injection 1 mg  1 mg IntraMUSCular PRN   ??? insulin glargine (LANTUS) injection 1-100 Units  1-100 Units SubCUTAneous QHS   ??? insulin lispro (HUMALOG) injection 1-100 Units  1-100 Units SubCUTAneous AC&HS   ??? insulin lispro (HUMALOG) injection 1-100 Units  1-100 Units SubCUTAneous PRN       Allergy  Allergies   Allergen Reactions   ??? Chocolate [Cocoa] Sneezing       Subjective:  Patient reports feeling better.  No shortness of breath, no dizziness, no wheezes, no difficult breathing.    Review of Systems:  A comprehensive ROS was performed and was otherwise negative except for as mentioned in HPI and subjective.     Physical Exam:   Vital Signs:    Visit Vitals  BP 111/80   Pulse (!) 102   Temp 98 ??F (36.7 ??C)   Resp 17   Ht 5' 10"  (1.778 m)   Wt 86.2 kg (190 lb)   SpO2 99%   Breastfeeding? No   BMI 27.26 kg/m??       O2 Device: BIPAP, Humidifier   O2 Flow Rate (L/min): 4 l/min   Temp (24hrs), Avg:98.1 ??F (  36.7 ??C), Min:98 ??F (36.7 ??C), Max:98.1 ??F (36.7 ??C)       Intake/Output:   Last shift:      06/04 1901 - 06/05 0700  In: 1000 [I.V.:1000]  Out: 425 [Urine:425]  Last 3 shifts: No intake/output data recorded.    Intake/Output Summary (Last 24 hours) at 11/15/2017 0327  Last data filed at 11/14/2017 2300  Gross per 24 hour   Intake 1000 ml   Output 425 ml   Net 575 ml        Physical Exam   Constitutional: She is oriented to person, place, and time and well-developed, well-nourished, and in no distress. Vital signs are normal. No distress.   HENT:   Head: Normocephalic and atraumatic.   Eyes: Pupils are equal, round, and reactive to light. Conjunctivae and EOM are normal.   Neck: Normal range of motion. Neck supple. No JVD present. No tracheal deviation present.    Cardiovascular: Normal rate, regular rhythm, normal heart sounds and intact distal pulses.   No murmur heard.  Pulmonary/Chest: Effort normal. No respiratory distress. She has wheezes.   Abdominal: Soft. Bowel sounds are normal. She exhibits no distension. There is no tenderness.   Musculoskeletal: Normal range of motion. She exhibits no edema.   Neurological: She is alert and oriented to person, place, and time.   Skin: Skin is warm and dry. She is not diaphoretic.   Nursing note and vitals reviewed.      Labs Reviewed:  Recent Results (from the past 48 hour(s))   CBC WITH AUTOMATED DIFF    Collection Time: 11/14/17  7:28 PM   Result Value Ref Range    WBC 22.4 (H) 4.0 - 11.0 1000/mm3    NEUTROPHILS 50.5 34 - 64 %    LYMPHOCYTES 42.9 28 - 48 %    RBC 4.70 3.60 - 5.20 M/uL    MONOCYTES 4.8 1 - 13 %    HGB 13.9 13.0 - 17.2 gm/dl    HCT 41.2 37.0 - 50.0 %    BASOPHILS 0.9 0 - 3 %    MCV 87.7 80.0 - 98.0 fL    MCH 29.6 25.4 - 34.6 pg    MCHC 33.7 30.0 - 36.0 gm/dl    PLATELET 398 140 - 450 1000/mm3    MPV 11.7 (H) 6.0 - 10.0 fL    ATYPICAL LYMPHS 0.9 (H) 0 - 0 %    RDW-SD 42.4 36.4 - 46.3      NRBC 0 0 - 0     METABOLIC PANEL, BASIC    Collection Time: 11/14/17  7:28 PM   Result Value Ref Range    Sodium 134 (L) 136 - 145 mEq/L    Potassium 4.6 3.5 - 5.1 mEq/L    Chloride 99 98 - 107 mEq/L    CO2 26 21 - 32 mEq/L    Glucose 379 (H) 74 - 106 mg/dl    BUN 20 7 - 25 mg/dl    Creatinine 1.3 0.6 - 1.3 mg/dl    GFR est AA 53.0      GFR est non-AA 44      Calcium 9.8 8.5 - 10.1 mg/dl    Anion gap 9 5 - 15 mmol/L   TROPONIN I    Collection Time: 11/14/17  7:28 PM   Result Value Ref Range    Troponin-I <0.015 0.000 - 0.045 ng/ml   NT-PRO BNP    Collection Time: 11/14/17  7:28 PM   Result  Value Ref Range    NT pro-BNP 33.0 0.0 - 125.0 pg/ml   POC BLOOD GAS + LACTIC ACID    Collection Time: 11/14/17  8:42 PM   Result Value Ref Range    pH 7.327 (L) 7.350 - 7.450      PCO2 46.5 (H) 35.0 - 45.0 mm Hg     PO2 159.0 (H) 75 - 100 mm Hg    BICARBONATE 24.5 18.0 - 26.0 mmol/L    O2 SAT 99.0 90 - 100 %    CO2, TOTAL 26.0 24 - 29 mmol/L    Lactic Acid 3.48 (H) 0.40 - 2.00 mmol/L    BASE EXCESS -2 -2 - 3 mmol/L    Sample type Art      FIO2 40      SITE R Radial      DEVICE BiPAP      ALLENS TEST Pass      IPAP/PIP 18      EPAP/CPAP/PEEP 8      Respiratory Rate 18      SET RATE 12      VTEXP 605     GLUCOSE, POC    Collection Time: 11/14/17 10:43 PM   Result Value Ref Range    Glucose (POC) 313 (H) 65 - 105 mg/dL   LACTIC ACID    Collection Time: 11/14/17 11:07 PM   Result Value Ref Range    Lactic Acid 4.0 (HH) 0.4 - 2.0 mmol/L   PROCALCITONIN    Collection Time: 11/14/17 11:07 PM   Result Value Ref Range    PROCALCITONIN <0.05 0.00 - 0.50 ng/ml   TROPONIN I    Collection Time: 11/14/17 11:07 PM   Result Value Ref Range    Troponin-I <0.015 0.000 - 0.045 ng/ml   LEGIONELLA PNEUMOPHILA AG, URINE    Collection Time: 11/14/17 11:20 PM   Result Value Ref Range    Legionella Ag, urine NEGATIVE NEGATIVE     S.PNEUMO AG, UR/CSF    Collection Time: 11/14/17 11:20 PM   Result Value Ref Range    Strep pneumo Ag, urine NEGATIVE NEGATIVE     CBC WITH AUTOMATED DIFF    Collection Time: 11/15/17  2:03 AM   Result Value Ref Range    WBC 10.9 4.0 - 11.0 1000/mm3    RBC 4.29 3.60 - 5.20 M/uL    HGB 12.2 (L) 13.0 - 17.2 gm/dl    HCT 37.7 37.0 - 50.0 %    MCV 87.9 80.0 - 98.0 fL    MCH 28.4 25.4 - 34.6 pg    MCHC 32.4 30.0 - 36.0 gm/dl    PLATELET 258 140 - 450 1000/mm3    MPV 11.0 (H) 6.0 - 10.0 fL    RDW-SD 41.9 36.4 - 46.3      NRBC 0 0 - 0      IMMATURE GRANULOCYTES 0.4 0.0 - 3.0 %    NEUTROPHILS 90.9 (H) 34 - 64 %    LYMPHOCYTES 7.6 (L) 28 - 48 %    MONOCYTES 1.0 1 - 13 %    EOSINOPHILS 0.0 0 - 5 %    BASOPHILS 0.1 0 - 3 %   METABOLIC PANEL, BASIC    Collection Time: 11/15/17  2:03 AM   Result Value Ref Range    Sodium 137 136 - 145 mEq/L    Potassium 5.0 3.5 - 5.1 mEq/L    Chloride 104 98 - 107 mEq/L    CO2 24 21 -  32 mEq/L     Glucose 319 (H) 74 - 106 mg/dl    BUN 17 7 - 25 mg/dl    Creatinine 1.1 0.6 - 1.3 mg/dl    GFR est AA >60.0      GFR est non-AA 53      Calcium 9.3 8.5 - 10.1 mg/dl    Anion gap 9 5 - 15 mmol/L   TROPONIN I    Collection Time: 11/15/17  2:03 AM   Result Value Ref Range    Troponin-I <0.015 0.000 - 0.045 ng/ml               Imaging:    Results from Hospital Encounter encounter on 11/14/17   XR CHEST SNGL V    Narrative Indication: dyspnea.    .      Impression IMPRESSION: Portable AP upright view the chest exposed at 8:13 PM November 14, 2017  reveals patchy infiltrates at the lung bases with groundglass granularity  consistent with emphysema and atelectasis similar to September 13, 2017. The heart is  of normal size.         Results from [place] encounter on 10/08/17   CTA CHEST W OR W WO CONT    Narrative DICOM format image data is available to non-affiliated external healthcare  facilities or entities on a secure, media free, reciprocally searchable basis  with patient authorization for 12 months following the date of the study.    Clinical history: Chest pain    EXAMINATION:  CTA chest with contrast. 3 mm spiral scanning is performed from the lung apices  to the upper poles of the kidneys. Coronal, sagittal and 3-D MIP sequences have  been obtained.    Correlation: 08/06/2017    FINDINGS:  There are degenerative changes of the spine. Trachea, right and left mainstem  bronchi are patent. There are bilateral axillary and compressive atelectasis.  1.8 x 1.8 cm well-circumscribed cavitary lesion left lower lobe not  significantly changed from 08/06/2017 and 11/03/2016 (increased in size from  07/03/2015, 1.3 x 1.3 cm).    2.3 cm right thyroid nodule, not significantly changed. The esophagus is not  dilated. Ascending aorta measures 4.4 cm. No lymph node enlargement in the  axilla, mediastinum or hila. No pulmonary embolism.    Visualized portions of the liver, gallbladder, pancreas and adrenal glands are   unremarkable. 13 mm indeterminate splenic lesion.      Impression IMPRESSION:  1. No pulmonary embolism.  2. Bilateral large bullae with compressive atelectasis.  3. 18 x 18 mm left lower lobe cavitary nodule (not significantly changed from  prior study although increased in size from 07/03/2015 - 1.3 x 1.3 cm).  4. 2.3 cm right thyroid nodule.  5. 1.3 cm indeterminate splenic lesion.    Initial interpretation: [facility].            Nita Sells APRN, [place]  Critical Care Services Nurse Practitioner   Pager: [phone]  11/15/17  3:27 AM    I have reviewed Gray Bernhardt chart. I saw and examined the patient, personally performed the critical or key portions of the service and discussed the management with the treatment team. I reviewed the NP's note and concur with the history, findings and plan of care, which include's my edits and my assessment and plan. Laboratory, hemodynamic and radiological data were reviewed.     Admitted with COPD ( bullous emphysema) exacerbation  On IV steroids and BIPAP + nebs  She is  also very anxious      Donneta Romberg, MD

## 2017-11-15 NOTE — Other (Signed)
Bedside shift change report given to Nationwide Mutual Insuranceiveen (Cabin crewoncoming nurse) by Olegario MessierKathy (offgoing nurse). Report included the following information SBAR, Kardex, Intake/Output, MAR and Recent Results.

## 2017-11-15 NOTE — Progress Notes (Signed)
Patient admitted on 11/14/2017 from home with   Chief Complaint   Patient presents with   ??? COPD   ??? Shortness of Breath    The patient is being treated for Acute Hypercapnic Respiratory Failure requiring BIPAP and COPD exacerbation.   The referral has been made to the Transitional Nurse Navigator.     The patient has been admitted to the hospital 4 times in the past 12 months.    Tentative dc plan: TBD pending progress and therapy recommendations. Anticipating home with family assistance, physician follow up, and resume [facility] RN, OT, PT, [facility] aide, and MSW vs Skilled Nursing Facility for short term rehab placement. The choice facility for rehab is [facility] and [address]. If rehab placement is the final disposition, insurance authorization will be required.     Anticipated Discharge Date: 2-3 days pending progress    PCP: [name], PA-C [facility])    Specialists: Dr. Fransisco Hertz- Pulmonology    Dialysis Unit: NA    Pharmacy: [facility]    DME: walker, cane, shower chair, BSC, Nebulizer, and stair lift    Home Environment: Lives at [address]  [place] [address]. Lives with her husband in a 2 story town home with upstairs bedroom. Her husband assists her with bathing, dressing, cooking, cleaning, and mobility as needed.     Prior to admission open services: RN and therapies    Home Health Agency- [facility]  Personal Care Agency-    Extended Emergency Contact Information  Primary Emergency Contact: Goodwin,Bobby  Address: [address]           [place], [place] [address] [place]  Home Phone: [phone]  Mobile Phone: [phone]  Relation: Spouse     Transportation: Family    Therapy Recommendations:    OT = pending    PT = pending    SLP =      RT Home O2 Evaluation =  Anticipate that the patient will need home O2 eval prior to discharge.     Wound Care =  NA     [facility] Consulted: No, not at this time.     Case Management Assessment        Preferred Language for Healthcare Related Communication     Preferred Language for Healthcare Related Communication: English   Spiritual/Ethnic/Cultural/Religious Needs that Should be Incorporated Into Your Care   Spiritual/Ethnic/Cultural/Religious Needs that Should be Incorporated Into Your Care: no      FUNCTIONAL ASSESSMENT   Fall in Past 12 Months: Yes   Fall With Injury: No           Decline in Gait/Transfer/Balance: No       Decline in Capacity to Feed/Dress/Bathe: No   Developmental Delay: No   Chewing/Swallowing Problems: No      DYSPHAGIA SCREENING                          Difficulty with Secretions     Difficulty with Secretions: No      Speech Slurred/Thick/Garbled     Speech Slurred/Thick/Garbled: No      ABUSE/NEGLECT SCREENING   Physical Abuse/Neglect: Denies   Sexual Abuse: Denies   Sexual Abuse: Denies   Other Abuse/Issues: Denies          PRIMARY DECISION MAKER  ADVANCE CARE PLANNING (ACP) DOCUMENTS   Confirm Advance Directive: Yes, on file   Is our copy the most recent?: Yes          Suicide/Psychosocial Screening   Primary Diagnosis or Primary Complaint of an Emotional Behavior Disorder: No   Patient is Currently Experiencing Depression: No   Suicidal Ideation/Attempts: No   Homicidal Ideation/Attempts: No   Alcohol/Drug Intoxication: No   Hallucinations/Delusions: No   Pending, Active, or Temporary Detention Orders: No   Aggressive/Inappropriate Behavior: No      SAD PERSONS                                                  READMIT RISK TOOL   Support Systems: Spouse/Significant Other/Partner, Home care staff   Relationship with Primary Physician Group: Seen at least one time within the past 6 months   History of Falls Within Past 3 Months: No   Needs Assistance with Wound Care AND/OR Mgnt of O2, Nebulizer: No    Requires Financial, Physical and/or Educational Assistance With Medications: No   History of Mental Illness: Yes   Living Alone: No      CARE MANAGEMENT INTERVENTIONS       PCP Verified by CM: Yes                   Transition of Care Consult (CM Consult): Discharge Planning, SNF, Home Health                   Physical Therapy Consult: Yes   Occupational Therapy Consult: Yes       Current Support Network: Lives with Spouse   Reason for Referral: DCP Rounds   History Provided By: Patient, Medical Record, Spouse, Physician   Patient Orientation: Alert and Oriented, Person, Place, Situation, Self   Cognition: Alert   Support System Response: Concerned, Cooperative   Previous Living Arrangement: Lives with Family Dependent   Home Accessibility: Steps, Multi Level Home   Prior Functional Level: Assistance with the following:, Mobility, Cooking, Housework, Bathing, Dressing, Shopping   Current Functional Level: Assistance with the following:, Bathing, Dressing, Housework, Cooking, Mobility, Shopping, Warden/rangerToileting   Primary Language: English   Can patient return to prior living arrangement: Unknown at present   Ability to make needs known:: Fair   Family able to assist with home care needs:: Yes               Types of Needs Identified: Disease Management Education, Treatment Education, ADLs/IADLs, Activity/Exercise, Functions Dependency Needs, Emotional Support, Support System/Social   Anticipated Discharge Needs: Home Health Services, Skilled Nursing Facility           Plan discussed with Pt/Family/Caregiver: Yes   Freedom of Choice Offered: Yes      DISCHARGE LOCATION   Discharge Placement: Unable to determine at this time

## 2017-11-15 NOTE — Progress Notes (Addendum)
0555 Verbal orders received to discontinue IV maintenance fluids as patient will be started on PO diet.    0615 Patient with dry/nonproductive cough. Developed audible wheezing, HR increased to 140's-150's. Patient placed on bipap, demonstrating increased WOB, accessory muscle use. 02 sats maintaining in 90's patient diaphoretic, attempting sit forward in bed. NP notified, patient given breathing treatment and verbal orders received to give 2mg IV ativan and 2mg of magnesium sulfate.     0700 Patients HR remains in 130's, showing decreased WOB. Bedside and Verbal shift change report given to Kathy RN (oncoming nurse) by Niveen Conliffe RN and Angela RItter RN (offgoing nurse). Report included the following information SBAR, Kardex, Recent Results and Cardiac Rhythm ST.

## 2017-11-15 NOTE — Progress Notes (Signed)
Remains on BIPAP. Restless and anxious at times. Seems more comfortable now than earlier. Not tolerating nc at this time. Vitals stable.

## 2017-11-15 NOTE — Progress Notes (Signed)
Medical Progress Note      NAME: [name] [name]   DOB:  Nov 06, 1950  MRM:  [id number]    Date/Time: 11/15/2017  4:16 PM     Subjective:     67 year old woman with past history of COPD/asthma HTN DM II presents with increasing SOB despite using HHN's at home. She initially required BiPAP and has been weaned now to [facility]. She has had HHNs and IV solumedrol. IV antibiotics have been started for treatment of pneumonia .she is feeling better but still SOB. She CO cough.     Past Medical History reviewed and unchanged from Admission History and Physical    Review of Systems   Constitutional: Positive for fatigue.   HENT: Positive for congestion.    Eyes: Negative.    Respiratory: Positive for cough and shortness of breath.    Cardiovascular: Negative.    Gastrointestinal: Negative.    Endocrine: Negative.    Genitourinary: Negative.    Musculoskeletal: Negative.    Skin: Negative.    Neurological: Negative.    Hematological: Negative.    Psychiatric/Behavioral: Negative.             Objective:       Vitals:      Last 24hrs VS reviewed since prior progress note. Most recent are:    Visit Vitals  BP (!) 148/95   Pulse (!) 128   Temp 99.2 ??F (37.3 ??C)   Resp 16   Ht 5\' 10"  (1.778 m)   Wt 86.2 kg (190 lb)   SpO2 100%   Breastfeeding? No   BMI 27.26 kg/m??     SpO2 Readings from Last 6 Encounters:   11/15/17 100%   11/07/17 90%   11/02/17 91%   10/11/17 98%   09/20/17 96%   09/14/17 99%    O2 Flow Rate (L/min): 25 l/min       Intake/Output Summary (Last 24 hours) at 11/15/2017 1616  Last data filed at 11/15/2017 0600  Gross per 24 hour   Intake 1493.75 ml   Output 1875 ml   Net -381.25 ml          Exam:      Physical Exam   Nursing note and vitals reviewed.  Constitutional: She is oriented to person, place, and time. She appears well-developed and well-nourished.   HENT:   Head: Normocephalic and atraumatic.   Eyes: Pupils are equal, round, and reactive to light.   Neck: Normal range of motion.   Cardiovascular: Normal rate.    Pulmonary/Chest: She has wheezes. She has rales.   Abdominal: Soft. Bowel sounds are normal.   Musculoskeletal: Normal range of motion.   Neurological: She is alert and oriented to person, place, and time.   Skin: Skin is warm.       Telemetry reviewed:   SVT    Lab Data Reviewed: (see below)      Medications:  Current Facility-Administered Medications   Medication Dose Route Frequency   ??? LORazepam (ATIVAN) injection 1-2 mg  1-2 mg IntraVENous Q6H PRN   ??? levalbuterol (XOPENEX) nebulizer soln 1.25 mg/3 mL  1.25 mg Nebulization Q4H RT   ??? cloNIDine HCl (CATAPRES) tablet 0.3 mg  0.3 mg Oral BID   ??? dilTIAZem (CARDIZEM) 125 mg in dextrose 5% 125 mL infusion  0-15 mg/hr IntraVENous TITRATE   ??? benzonatate (TESSALON) capsule 100 mg  100 mg Oral TID PRN   ??? guaiFENesin (ROBITUSSIN) 100 mg/5 mL oral liquid 100 mg  100 mg Oral Q4H PRN   ??? guaiFENesin-codeine (ROBITUSSIN AC) 100-10 mg/5 mL solution 5 mL  5 mL Oral Q4H PRN   ??? cefTRIAXone (ROCEPHIN) 1 g in 0.9% sodium chloride (MBP/ADV) 50 mL MBP  1 g IntraVENous Q24H   ??? azithromycin (ZITHROMAX) 500 mg in 0.9% sodium chloride 250 mL IVPB  500 mg IntraVENous Q24H   ??? fluticasone-vilanterol (BREO ELLIPTA) 27700mcg-25mcg/puff  1 Puff Inhalation DAILY   ??? montelukast (SINGULAIR) tablet 10 mg  10 mg Oral QHS   ??? rosuvastatin (CRESTOR) tablet 20 mg  20 mg Oral QHS   ??? spironolactone (ALDACTONE) tablet 100 mg  100 mg Oral DAILY   ??? tiotropium bromide (SPIRIVA RESPIMAT) 2.5 mcg /actuation  2 Puff Inhalation DAILY   ??? acetaminophen (TYLENOL) tablet 650 mg  650 mg Oral Q6H PRN   ??? PHOSPHATE ELECTROLYTE REPLACEMENT PROTOCOL STANDARD DOSING  1 Each Other PRN   ??? MAGNESIUM ELECTROLYTE REPLACEMENT PROTOCOL STANDARD DOSING  1 Each Other PRN   ??? POTASSIUM ELECTROLYTE REPLACEMENT PROTOCOL STANDARD DOSING  1 Each Other PRN   ??? CALCIUM ELECTROLYTE REPLACEMENT PROTOCOL STANDARD DOSING  1 Each Other PRN   ??? heparin (porcine) injection 5,000 Units  5,000 Units SubCUTAneous Q8H    ??? methylPREDNISolone (PF) (SOLU-MEDROL) injection 40 mg  40 mg IntraVENous Q6H   ??? dextrose (D50) infusion 5-25 g  10-50 mL IntraVENous PRN   ??? glucagon (GLUCAGEN) injection 1 mg  1 mg IntraMUSCular PRN   ??? insulin glargine (LANTUS) injection 1-100 Units  1-100 Units SubCUTAneous QHS   ??? insulin lispro (HUMALOG) injection 1-100 Units  1-100 Units SubCUTAneous AC&HS   ??? insulin lispro (HUMALOG) injection 1-100 Units  1-100 Units SubCUTAneous PRN       ______________________________________________________________________      Lab Review:     Recent Labs     11/15/17  0203 11/14/17  1928   WBC 10.9 22.4*   HGB 12.2* 13.9   HCT 37.7 41.2   PLT 258 398     Recent Labs     11/15/17  0203 11/14/17  2042 11/14/17  1928   NA 137  --  134*   K 5.0  --  4.6   CL 104  --  99   CO2 24 26.0 26   GLU 319*  --  379*   BUN 17  --  20   CREA 1.1  --  1.3   CA 9.3  --  9.8       Recent Labs     11/14/17  2042   PH 7.327*   PCO2 46.5*   PO2 159.0*   HCO3 24.5   FIO2 40          Assessment:     Active Problems:    COPD exacerbation (HCC) (11/14/2017)           Plan:     Risk of deterioration: medium             1. Acute respiratory failure with hypoxia and hypercapnea improved. Weaning o2   2. COPD/asthma exacerbation. Continue nebs/steroids  3. Lactic acidosis due to sepsis. Monitoring trend  4. Sepsis due to pneuomnia continue antibiotics. Continue NS  5. Pneumonia The current medical regimen is effective;  continue present plan and medications.   6. LLL pulmonary nodule. Stable.   7. HTN. Continue clonidine and ACE. Adding IV cardizem for now due to elevated HR  8. Continue Breo/singulair/spiriva  9. Hyperglycemia. Continue glucommander protocol  10. DVT prophylaxis with subQ heparin  11. ADA diet  12. Full code status             Total time spent with patient: 78 Minutes                  Care Plan discussed with: Patient, Family, Care Manager, Nursing Staff, Consultant/Specialist and >50% of time spent in counseling and  coordination of care    Discussed:  Care Plan    Prophylaxis:  Hep SQ and SCD's    Disposition:  Home w/Family and HH PT, OT, RN           ___________________________________________________    Attending Physician: Marlene Bast, MD

## 2017-11-15 NOTE — Progress Notes (Signed)
Medical Progress Note      NAME: [name] [name]   DOB:  [birth year]  MRM:  [id number]    Date/Time: 11/15/2017  4:16 PM         Subjective:     67 year old woman with past history of COPD/asthma HTN DM II presents with increasing SOB despite using HHN's at home. She initially required BiPAP and has been weaned now to [facility]. She has had HHNs and IV solumedrol. IV antibiotics have been started for treatment of pneumonia .she is feeling better but still SOB. She CO cough.     Past Medical History reviewed and unchanged from Admission History and Physical    Review of Systems   Constitutional: Positive for fatigue.   HENT: Positive for congestion.    Eyes: Negative.    Respiratory: Positive for cough and shortness of breath.    Cardiovascular: Negative.    Gastrointestinal: Negative.    Endocrine: Negative.    Genitourinary: Negative.    Musculoskeletal: Negative.    Skin: Negative.    Neurological: Negative.    Hematological: Negative.    Psychiatric/Behavioral: Negative.             Objective:       Vitals:      Last 24hrs VS reviewed since prior progress note. Most recent are:    Visit Vitals  BP (!) 148/95   Pulse (!) 128   Temp 99.2 ??F (37.3 ??C)   Resp 16   Ht 5\' 10"  (1.778 m)   Wt 86.2 kg (190 lb)   SpO2 100%   Breastfeeding? No   BMI 27.26 kg/m??     SpO2 Readings from Last 6 Encounters:   11/15/17 100%   11/07/17 90%   11/02/17 91%   10/11/17 98%   09/20/17 96%   09/14/17 99%    O2 Flow Rate (L/min): 25 l/min       Intake/Output Summary (Last 24 hours) at 11/15/2017 1616  Last data filed at 11/15/2017 0600  Gross per 24 hour   Intake 1493.75 ml   Output 1875 ml   Net -381.25 ml          Exam:      Physical Exam   Nursing note and vitals reviewed.  Constitutional: She is oriented to person, place, and time. She appears well-developed and well-nourished.   HENT:   Head: Normocephalic and atraumatic.   Eyes: Pupils are equal, round, and reactive to light.   Neck: Normal range of motion.   Cardiovascular: Normal rate.    Pulmonary/Chest: She has wheezes. She has rales.   Abdominal: Soft. Bowel sounds are normal.   Musculoskeletal: Normal range of motion.   Neurological: She is alert and oriented to person, place, and time.   Skin: Skin is warm.       Telemetry reviewed:   SVT    Lab Data Reviewed: (see below)      Medications:  Current Facility-Administered Medications   Medication Dose Route Frequency   ??? LORazepam (ATIVAN) injection 1-2 mg  1-2 mg IntraVENous Q6H PRN   ??? levalbuterol (XOPENEX) nebulizer soln 1.25 mg/3 mL  1.25 mg Nebulization Q4H RT   ??? cloNIDine HCl (CATAPRES) tablet 0.3 mg  0.3 mg Oral BID   ??? dilTIAZem (CARDIZEM) 125 mg in dextrose 5% 125 mL infusion  0-15 mg/hr IntraVENous TITRATE   ??? benzonatate (TESSALON) capsule 100 mg  100 mg Oral TID PRN   ??? guaiFENesin (ROBITUSSIN) 100 mg/5 mL  oral liquid 100 mg  100 mg Oral Q4H PRN   ??? guaiFENesin-codeine (ROBITUSSIN AC) 100-10 mg/5 mL solution 5 mL  5 mL Oral Q4H PRN   ??? cefTRIAXone (ROCEPHIN) 1 g in 0.9% sodium chloride (MBP/ADV) 50 mL MBP  1 g IntraVENous Q24H   ??? azithromycin (ZITHROMAX) 500 mg in 0.9% sodium chloride 250 mL IVPB  500 mg IntraVENous Q24H   ??? fluticasone-vilanterol (BREO ELLIPTA) 28000mcg-25mcg/puff  1 Puff Inhalation DAILY   ??? montelukast (SINGULAIR) tablet 10 mg  10 mg Oral QHS   ??? rosuvastatin (CRESTOR) tablet 20 mg  20 mg Oral QHS   ??? spironolactone (ALDACTONE) tablet 100 mg  100 mg Oral DAILY   ??? tiotropium bromide (SPIRIVA RESPIMAT) 2.5 mcg /actuation  2 Puff Inhalation DAILY   ??? acetaminophen (TYLENOL) tablet 650 mg  650 mg Oral Q6H PRN   ??? PHOSPHATE ELECTROLYTE REPLACEMENT PROTOCOL STANDARD DOSING  1 Each Other PRN   ??? MAGNESIUM ELECTROLYTE REPLACEMENT PROTOCOL STANDARD DOSING  1 Each Other PRN   ??? POTASSIUM ELECTROLYTE REPLACEMENT PROTOCOL STANDARD DOSING  1 Each Other PRN   ??? CALCIUM ELECTROLYTE REPLACEMENT PROTOCOL STANDARD DOSING  1 Each Other PRN   ??? heparin (porcine) injection 5,000 Units  5,000 Units SubCUTAneous Q8H   ???  methylPREDNISolone (PF) (SOLU-MEDROL) injection 40 mg  40 mg IntraVENous Q6H   ??? dextrose (D50) infusion 5-25 g  10-50 mL IntraVENous PRN   ??? glucagon (GLUCAGEN) injection 1 mg  1 mg IntraMUSCular PRN   ??? insulin glargine (LANTUS) injection 1-100 Units  1-100 Units SubCUTAneous QHS   ??? insulin lispro (HUMALOG) injection 1-100 Units  1-100 Units SubCUTAneous AC&HS   ??? insulin lispro (HUMALOG) injection 1-100 Units  1-100 Units SubCUTAneous PRN       ______________________________________________________________________      Lab Review:     Recent Labs     11/15/17  0203 11/14/17  1928   WBC 10.9 22.4*   HGB 12.2* 13.9   HCT 37.7 41.2   PLT 258 398     Recent Labs     11/15/17  0203 11/14/17  2042 11/14/17  1928   NA 137  --  134*   K 5.0  --  4.6   CL 104  --  99   CO2 24 26.0 26   GLU 319*  --  379*   BUN 17  --  20   CREA 1.1  --  1.3   CA 9.3  --  9.8       Recent Labs     11/14/17  2042   PH 7.327*   PCO2 46.5*   PO2 159.0*   HCO3 24.5   FIO2 40          Assessment:     Active Problems:    COPD exacerbation (HCC) (11/14/2017)           Plan:     Risk of deterioration: medium             1. Acute respiratory failure with hypoxia and hypercapnea improved. Weaning o2   2. COPD/asthma exacerbation. Continue nebs/steroids  3. Lactic acidosis due to sepsis. Monitoring trend  4. Sepsis due to pneuomnia continue antibiotics. Continue NS  5. Pneumonia The current medical regimen is effective;  continue present plan and medications.   6. LLL pulmonary nodule. Stable.   7. HTN. Continue clonidine and ACE. Adding IV cardizem for now due to elevated HR  8. Continue Breo/singulair/spiriva  9. Hyperglycemia. Continue glucommander protocol  10. DVT prophylaxis with subQ heparin  11. ADA diet  12. Full code status             Total time spent with patient: 37 Minutes                  Care Plan discussed with: Patient, Family, Care Manager, Nursing Staff, Consultant/Specialist and >50% of time spent in counseling and coordination of  care    Discussed:  Care Plan    Prophylaxis:  Hep SQ and SCD's    Disposition:  Home w/Family and HH PT, OT, RN           ___________________________________________________    Attending Physician: Marlene Bast, MD

## 2017-11-15 NOTE — Progress Notes (Signed)
 Patient admitted on 11/14/2017 from home with   Chief Complaint   Patient presents with   . COPD   . Shortness of Breath    The patient is being treated for Acute Hypercapnic Respiratory Failure requiring BIPAP and COPD exacerbation.   The referral has been made to the Transitional Nurse Navigator.     The patient has been admitted to the hospital 4 times in the past 12 months.    Tentative dc plan: TBD pending progress and therapy recommendations. Anticipating home with family assistance, physician follow up, and resume [facility] RN, OT, PT, [facility] aide, and MSW vs Skilled Nursing Facility for short term rehab placement. The choice facility for rehab is [facility] and [address]. If rehab placement is the final disposition, insurance authorization will be required.     Anticipated Discharge Date: 2-3 days pending progress    PCP: Lucious Gun, PA-C [facility])    Specialists: Dr. Maude Moles- Pulmonology    Dialysis Unit: NA    Pharmacy: [facility]    DME: walker, cane, shower chair, BSC, Nebulizer, and stair lift    Home Environment: Lives at [address]  [place] [address]. Lives with her husband in a 2 story town home with upstairs bedroom. Her husband assists her with bathing, dressing, cooking, cleaning, and mobility as needed.     Prior to admission open services: RN and therapies    Home Health Agency- [facility]  Personal Care Agency-    Extended Emergency Contact Information  Primary Emergency Contact: Goodwin,Bobby  Address: [address]           [place], [place] [address] [place]  Home Phone: [phone]  Mobile Phone: [phone]  Relation: Spouse     Transportation: Family    Therapy Recommendations:    OT = pending    PT = pending    SLP =      RT Home O2 Evaluation =  Anticipate that the patient will need home O2 eval prior to discharge.     Wound Care =  NA    [facility] Consulted: No, not at this time.     Case Management Assessment        Preferred Language for Healthcare Related Communication     Preferred Language for Healthcare Related Communication: English   Spiritual/Ethnic/Cultural/Religious Needs that Should be Incorporated Into Your Care   Spiritual/Ethnic/Cultural/Religious Needs that Should be Incorporated Into Your Care: no      FUNCTIONAL ASSESSMENT   Fall in Past 12 Months: Yes   Fall With Injury: No           Decline in Gait/Transfer/Balance: No       Decline in Capacity to Feed/Dress/Bathe: No   Developmental Delay: No   Chewing/Swallowing Problems: No      DYSPHAGIA SCREENING                          Difficulty with Secretions     Difficulty with Secretions: No      Speech Slurred/Thick/Garbled     Speech Slurred/Thick/Garbled: No      ABUSE/NEGLECT SCREENING   Physical Abuse/Neglect: Denies   Sexual Abuse: Denies   Sexual Abuse: Denies   Other Abuse/Issues: Denies          PRIMARY DECISION MAKER  ADVANCE CARE PLANNING (ACP) DOCUMENTS   Confirm Advance Directive: Yes, on file   Is our copy the most recent?: Yes          Suicide/Psychosocial Screening   Primary Diagnosis or Primary Complaint of an Emotional Behavior Disorder: No   Patient is Currently Experiencing Depression: No   Suicidal Ideation/Attempts: No   Homicidal Ideation/Attempts: No   Alcohol/Drug Intoxication: No   Hallucinations/Delusions: No   Pending, Active, or Temporary Detention Orders: No   Aggressive/Inappropriate Behavior: No      SAD PERSONS                                                  READMIT RISK TOOL   Support Systems: Spouse/Significant Other/Partner, Home care staff   Relationship with Primary Physician Group: Seen at least one time within the past 6 months   History of Falls Within Past 3 Months: No   Needs Assistance with Wound Care AND/OR Mgnt of O2, Nebulizer: No   Requires Financial, Physical and/or Educational Assistance With  Medications: No   History of Mental Illness: Yes   Living Alone: No      CARE MANAGEMENT INTERVENTIONS       PCP Verified by CM: Yes                   Transition of Care Consult (CM Consult): Discharge Planning, SNF, Home Health                   Physical Therapy Consult: Yes   Occupational Therapy Consult: Yes       Current Support Network: Lives with Spouse   Reason for Referral: DCP Rounds   History Provided By: Patient, Medical Record, Spouse, Physician   Patient Orientation: Alert and Oriented, Person, Place, Situation, Self   Cognition: Alert   Support System Response: Concerned, Cooperative   Previous Living Arrangement: Lives with Family Dependent   Home Accessibility: Steps, Multi Level Home   Prior Functional Level: Assistance with the following:, Mobility, Cooking, Housework, Bathing, Dressing, Shopping   Current Functional Level: Assistance with the following:, Bathing, Dressing, Housework, Cooking, Mobility, Shopping, Warden/ranger Language: English   Can patient return to prior living arrangement: Unknown at present   Ability to make needs known:: Fair   Family able to assist with home care needs:: Yes               Types of Needs Identified: Disease Management Education, Treatment Education, ADLs/IADLs, Activity/Exercise, Functions Dependency Needs, Emotional Support, Support System/Social   Anticipated Discharge Needs: Home Health Services, Skilled Nursing Facility           Plan discussed with Pt/Family/Caregiver: Yes   Freedom of Choice Offered: Yes      DISCHARGE LOCATION   Discharge Placement: Unable to determine at this time

## 2017-11-15 NOTE — Consults (Signed)
[facility]       ICU Consult Note    Patient: [name] [name]               Sex: female          DOA: 11/14/2017       Date of Birth:  1950/09/26      Age:  67 y.o.        LOS:  LOS: 1 day          Consult requested by Dr. Winona Legato  Reason for Consult: BiPAP  ASSESSMENT:   -Acute hypercapnic respiratory failure requiring BiPAP  -COPD/Asthma Exacerbation: procalcitonin <0.05  -Lactic acidosis: 3.48 on admission  -Hyperglycemia  -Bilateral bullous emphysema: Chest Xray revealed patchy infiltrates with ground glass granularity consistent with emphysema and atelectasis  -Stable LLL pulmonary nodule 18 mm x 33m  -Hypertension on Norvasc and Clonidine as an outpatient  -History of Hepatitis  -History of depression  -History of GERD    PLAN:   -Transitioned to NC Supplemental O2.  Titrate to maintain SaO2 >90%.  -Continue on PRN nebs and Solu-Medrol Q6 hours  -Continue on Breo, Singulair, and Spiriva  -Started on Ceftriaxone and Azithromax for empiric coverage.  Cultures pending.  Will de-escalate based on clinical progression and final culture results.  -Continue NS at 125 mL/hr  -Trend lactic acid level x3 sets  -Okay to advance diet to regular. Will d/c fluids once she is tolerating diet.  -Accuchecks AC/HS  -Insulin per [facility] protocol.  -Electrolytes and labs per replacement protocol.  -Heparin SQ for DVT prophylaxis  -CBC, BMP in am.     Explained assessment and plan to the patient and answered questions.   Discussed with Dr. DVolney American Discussed with the nurse and RT.  CCT: 33 min      HPI:   [name] AGREDANOis a 67y.o. female with a PMH of COPD/Asthma, HTN, DM [facility].  The patient reports that she has a home nurse that comes out to see her twice a week and was unable to improve her condition with her rescue breathing treatments.  The patient denies any fevers, chills, cough, or other respiratory symptoms.  In the ER,  the patient was placed on BIPAP and given 2L IVF bolus, solu-medrol 125 mg, and multiple breathing treatments.  The patient was admitted to ICU with PCCM consult for further evaluation and management.     Past Medical History  Past Medical History:   Diagnosis Date   ??? Arthritis    ??? 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 COPD (chronic obstructive pulmonary disease) ([place]    ??? DM2 (di[place], type 2) [place]    ??? GERD (gastroesophageal reflux disease)    ??? Hepatitis C     HEP C   ??? HTN (hypertension)    ??? Lung nodule, multiple     (CT 10/2016) Small left upper lobe and 1.7 x 1.6 cm left lower lobe nodules not significantly changed from 07/23/2016 and 03/22/2016   ??? Marijuana use    ??? PTSD (post-traumatic stress disorder)     [place]   ??? [place]     4.4cm noted on CT Chest (10/2016)   ??? Thyroid nodule     2.5 cm stable right thyroid nodule (CT  10/2016)       Past Surgical History  Past Surgical History:   Procedure Laterality Date   ??? HX ENDOSCOPY     ??? HX HIP REPLACEMENT Right 01/29/2015   ??? HX HIP REPLACEMENT     ??? HX HYSTERECTOMY  2010    hysterectomy       Family History  Family History   Problem Relation Age of Onset   ??? Hypertension Mother    ??? Other Mother         OSA   ??? Cancer Father         suspected leukemia per pt's spouse   ??? Cancer Maternal Aunt    ??? Alcohol abuse Maternal Grandfather        Social History  Social History     Tobacco Use   ??? Smoking status: Former Smoker   ??? Smokeless tobacco: Never Used   ??? Tobacco comment: reported as quit smoking around 1990s per spouse   Substance Use Topics   ??? Alcohol use: No   ??? Drug use: Yes     Types: Marijuana        Medications  Prior to Admission medications    Medication Sig Start Date End Date Taking? Authorizing Provider   nabumetone (RELAFEN) 750 mg tablet TAKE 1 TABLET BY MOUTH TWICE DAILY AS NEEDED FOR PAIN. USE SPARINGLY 11/10/17   Wallene Huh, PA-C    albuterol (PROVENTIL HFA, VENTOLIN HFA, PROAIR HFA) 90 mcg/actuation inhaler Take 2 Puffs by inhalation every six (6) hours for 5 days, THEN 2 Puffs every four (4) hours as needed for Wheezing or Shortness of Breath for up to 30 days. 10/11/17 11/15/17  Holladay, Lorra Hals, MD   tiotropium bromide (SPIRIVA RESPIMAT) 2.5 mcg/actuation inhaler Take 1 Puff by inhalation daily. 10/11/17   Holladay, Lorra Hals, MD   cloNIDine HCl (CATAPRES) 0.3 mg tablet Take bid  Patient taking differently: Take 1 Tab by mouth two (2) times a day. Take bid 10/11/17   Holladay, Lorra Hals, MD   fluticasone propion-salmeterol (ADVAIR DISKUS) 100-50 mcg/dose diskus inhaler Take 1 Puff by inhalation two (2) times a day. 10/11/17   Holladay, Lorra Hals, MD   metFORMIN ER (GLUCOPHAGE XR) 750 mg tablet Take 1 Tab by mouth two (2) times a day. 09/18/17   Otila Kluver, MD   gabapentin (NEURONTIN) 100 mg capsule Take 600 mg by mouth three (3) times daily.    Provider, Historical   spironolactone (ALDACTONE) 100 mg tablet TAKE 1 TABLET BY MOUTH DAILY 08/16/17   Caryl Bis, FNP   montelukast (SINGULAIR) 10 mg tablet TAKE 1 TABLET BY MOUTH DAILY 08/16/17   Wallene Huh, PA-C   magnesium oxide (MAG-OX) 400 mg tablet TAKE 1 TABLET BY MOUTH DAILY 08/14/17   Caryl Bis, FNP   traZODone (DESYREL) 50 mg tablet Take 1 Tab by mouth nightly as needed for Sleep. 08/09/17   Einar Grad, MD   losartan (COZAAR) 100 mg tablet TAKE 1 TABLET BY MOUTH DAILY FOR HYPERTENSION 07/17/17   [place]ake Bells, MD   QUEtiapine SR (SEROQUEL XR) 150 mg sr tablet TAKE 1 TABLET BY MOUTH NIGHTLY 07/13/17   [place]ake Bells, MD   amLODIPine (NORVASC) 10 mg tablet TAKE ONE TABLET BY MOUTH ONCE DAILY 07/13/17   [place] Bells, MD   rosuvastatin (CRESTOR) 5 mg tablet TAKE 1 TABLET BY MOUTH NIGHTLY 07/13/17   Tharakan, Sonnie Alamo, MD   albuterol (ACCUNEB) 1.25 mg/3 mL nebu 3  mL by Nebulization route every six (6) hours as needed. 06/18/17   Edger House, MD   polyethylene glycol  (MIRALAX) 17 gram packet Take 1 Packet by mouth daily. 05/09/17   [place] Bells, MD   triamcinolone (NASACORT) 55 mcg nasal inhaler 2 Sprays by Both Nostrils route daily. 02/22/17   Wallene Huh, PA-C   Blood-Glucose Meter monitoring kit Check sugars 2 times per day, fasting and 2 hours after dinner 07/15/15   [place]ake Bells, MD   Lancets misc Check sugars twice daily 07/15/15   [place]ake Bells, MD   glucose blood VI test strips (ASCENSIA AUTODISC VI, ONE TOUCH ULTRA TEST VI) strip Check sugars twice daily 07/15/15   [place]ake Bells, MD       Current Facility-Administered Medications   Medication Dose Route Frequency   ??? albuterol (PROVENTIL VENTOLIN) nebulizer solution 2.5 mg  2.5 mg Nebulization Q2H PRN   ??? cefTRIAXone (ROCEPHIN) 1 g in 0.9% sodium chloride (MBP/ADV) 50 mL MBP  1 g IntraVENous Q24H   ??? azithromycin (ZITHROMAX) 500 mg in 0.9% sodium chloride 250 mL IVPB  500 mg IntraVENous Q24H   ??? albuterol (ACCUNEB) nebulizer solution 1.25 mg  1.25 mg Nebulization Q4H PRN   ??? fluticasone-vilanterol (BREO ELLIPTA) 251mg-25mcg/puff  1 Puff Inhalation DAILY   ??? montelukast (SINGULAIR) tablet 10 mg  10 mg Oral QHS   ??? rosuvastatin (CRESTOR) tablet 20 mg  20 mg Oral QHS   ??? spironolactone (ALDACTONE) tablet 100 mg  100 mg Oral DAILY   ??? tiotropium bromide (SPIRIVA RESPIMAT) 2.5 mcg /actuation  2 Puff Inhalation DAILY   ??? acetaminophen (TYLENOL) tablet 650 mg  650 mg Oral Q6H PRN   ??? PHOSPHATE ELECTROLYTE REPLACEMENT PROTOCOL STANDARD DOSING  1 Each Other PRN   ??? MAGNESIUM ELECTROLYTE REPLACEMENT PROTOCOL STANDARD DOSING  1 Each Other PRN   ??? POTASSIUM ELECTROLYTE REPLACEMENT PROTOCOL STANDARD DOSING  1 Each Other PRN   ??? CALCIUM ELECTROLYTE REPLACEMENT PROTOCOL STANDARD DOSING  1 Each Other PRN   ??? heparin (porcine) injection 5,000 Units  5,000 Units SubCUTAneous Q8H   ??? methylPREDNISolone (PF) (SOLU-MEDROL) injection 40 mg  40 mg IntraVENous Q6H   ??? 0.9% sodium chloride infusion  125 mL/hr IntraVENous  CONTINUOUS   ??? dextrose (D50) infusion 5-25 g  10-50 mL IntraVENous PRN   ??? glucagon (GLUCAGEN) injection 1 mg  1 mg IntraMUSCular PRN   ??? insulin glargine (LANTUS) injection 1-100 Units  1-100 Units SubCUTAneous QHS   ??? insulin lispro (HUMALOG) injection 1-100 Units  1-100 Units SubCUTAneous AC&HS   ??? insulin lispro (HUMALOG) injection 1-100 Units  1-100 Units SubCUTAneous PRN       Allergy  Allergies   Allergen Reactions   ??? Chocolate [Cocoa] Sneezing       Subjective:  Patient reports feeling better.  No shortness of breath, no dizziness, no wheezes, no difficult breathing.    Review of Systems:  A comprehensive ROS was performed and was otherwise negative except for as mentioned in HPI and subjective.     Physical Exam:   Vital Signs:    Visit Vitals  BP 111/80   Pulse (!) 102   Temp 98 ??F (36.7 ??C)   Resp 17   Ht '5\' 10"'  (1.778 m)   Wt 86.2 kg (190 lb)   SpO2 99%   Breastfeeding? No   BMI 27.26 kg/m??       O2 Device: BIPAP, Humidifier   O2 Flow Rate (L/min): 4  l/min   Temp (24hrs), Avg:98.1 ??F (36.7 ??C), Min:98 ??F (36.7 ??C), Max:98.1 ??F (36.7 ??C)       Intake/Output:   Last shift:      06/04 1901 - 06/05 0700  In: 1000 [I.V.:1000]  Out: 425 [Urine:425]  Last 3 shifts: No intake/output data recorded.    Intake/Output Summary (Last 24 hours) at 11/15/2017 0327  Last data filed at 11/14/2017 2300  Gross per 24 hour   Intake 1000 ml   Output 425 ml   Net 575 ml        Physical Exam   Constitutional: She is oriented to person, place, and time and well-developed, well-nourished, and in no distress. Vital signs are normal. No distress.   HENT:   Head: Normocephalic and atraumatic.   Eyes: Pupils are equal, round, and reactive to light. Conjunctivae and EOM are normal.   Neck: Normal range of motion. Neck supple. No JVD present. No tracheal deviation present.   Cardiovascular: Normal rate, regular rhythm, normal heart sounds and intact distal pulses.   No murmur heard.  Pulmonary/Chest: Effort normal. No respiratory  distress. She has wheezes.   Abdominal: Soft. Bowel sounds are normal. She exhibits no distension. There is no tenderness.   Musculoskeletal: Normal range of motion. She exhibits no edema.   Neurological: She is alert and oriented to person, place, and time.   Skin: Skin is warm and dry. She is not diaphoretic.   Nursing note and vitals reviewed.      Labs Reviewed:  Recent Results (from the past 48 hour(s))   CBC WITH AUTOMATED DIFF    Collection Time: 11/14/17  7:28 PM   Result Value Ref Range    WBC 22.4 (H) 4.0 - 11.0 1000/mm3    NEUTROPHILS 50.5 34 - 64 %    LYMPHOCYTES 42.9 28 - 48 %    RBC 4.70 3.60 - 5.20 M/uL    MONOCYTES 4.8 1 - 13 %    HGB 13.9 13.0 - 17.2 gm/dl    HCT 41.2 37.0 - 50.0 %    BASOPHILS 0.9 0 - 3 %    MCV 87.7 80.0 - 98.0 fL    MCH 29.6 25.4 - 34.6 pg    MCHC 33.7 30.0 - 36.0 gm/dl    PLATELET 398 140 - 450 1000/mm3    MPV 11.7 (H) 6.0 - 10.0 fL    ATYPICAL LYMPHS 0.9 (H) 0 - 0 %    RDW-SD 42.4 36.4 - 46.3      NRBC 0 0 - 0     METABOLIC PANEL, BASIC    Collection Time: 11/14/17  7:28 PM   Result Value Ref Range    Sodium 134 (L) 136 - 145 mEq/L    Potassium 4.6 3.5 - 5.1 mEq/L    Chloride 99 98 - 107 mEq/L    CO2 26 21 - 32 mEq/L    Glucose 379 (H) 74 - 106 mg/dl    BUN 20 7 - 25 mg/dl    Creatinine 1.3 0.6 - 1.3 mg/dl    GFR est AA 53.0      GFR est non-AA 44      Calcium 9.8 8.5 - 10.1 mg/dl    Anion gap 9 5 - 15 mmol/L   TROPONIN I    Collection Time: 11/14/17  7:28 PM   Result Value Ref Range    Troponin-I <0.015 0.000 - 0.045 ng/ml   NT-PRO BNP    Collection Time:  11/14/17  7:28 PM   Result Value Ref Range    NT pro-BNP 33.0 0.0 - 125.0 pg/ml   POC BLOOD GAS + LACTIC ACID    Collection Time: 11/14/17  8:42 PM   Result Value Ref Range    pH 7.327 (L) 7.350 - 7.450      PCO2 46.5 (H) 35.0 - 45.0 mm Hg    PO2 159.0 (H) 75 - 100 mm Hg    BICARBONATE 24.5 18.0 - 26.0 mmol/L    O2 SAT 99.0 90 - 100 %    CO2, TOTAL 26.0 24 - 29 mmol/L    Lactic Acid 3.48 (H) 0.40 - 2.00 mmol/L    BASE EXCESS  -2 -2 - 3 mmol/L    Sample type Art      FIO2 40      SITE R Radial      DEVICE BiPAP      ALLENS TEST Pass      IPAP/PIP 18      EPAP/CPAP/PEEP 8      Respiratory Rate 18      SET RATE 12      VTEXP 605     GLUCOSE, POC    Collection Time: 11/14/17 10:43 PM   Result Value Ref Range    Glucose (POC) 313 (H) 65 - 105 mg/dL   LACTIC ACID    Collection Time: 11/14/17 11:07 PM   Result Value Ref Range    Lactic Acid 4.0 (HH) 0.4 - 2.0 mmol/L   PROCALCITONIN    Collection Time: 11/14/17 11:07 PM   Result Value Ref Range    PROCALCITONIN <0.05 0.00 - 0.50 ng/ml   TROPONIN I    Collection Time: 11/14/17 11:07 PM   Result Value Ref Range    Troponin-I <0.015 0.000 - 0.045 ng/ml   LEGIONELLA PNEUMOPHILA AG, URINE    Collection Time: 11/14/17 11:20 PM   Result Value Ref Range    Legionella Ag, urine NEGATIVE NEGATIVE     S.PNEUMO AG, UR/CSF    Collection Time: 11/14/17 11:20 PM   Result Value Ref Range    Strep pneumo Ag, urine NEGATIVE NEGATIVE     CBC WITH AUTOMATED DIFF    Collection Time: 11/15/17  2:03 AM   Result Value Ref Range    WBC 10.9 4.0 - 11.0 1000/mm3    RBC 4.29 3.60 - 5.20 M/uL    HGB 12.2 (L) 13.0 - 17.2 gm/dl    HCT 37.7 37.0 - 50.0 %    MCV 87.9 80.0 - 98.0 fL    MCH 28.4 25.4 - 34.6 pg    MCHC 32.4 30.0 - 36.0 gm/dl    PLATELET 258 140 - 450 1000/mm3    MPV 11.0 (H) 6.0 - 10.0 fL    RDW-SD 41.9 36.4 - 46.3      NRBC 0 0 - 0      IMMATURE GRANULOCYTES 0.4 0.0 - 3.0 %    NEUTROPHILS 90.9 (H) 34 - 64 %    LYMPHOCYTES 7.6 (L) 28 - 48 %    MONOCYTES 1.0 1 - 13 %    EOSINOPHILS 0.0 0 - 5 %    BASOPHILS 0.1 0 - 3 %   METABOLIC PANEL, BASIC    Collection Time: 11/15/17  2:03 AM   Result Value Ref Range    Sodium 137 136 - 145 mEq/L    Potassium 5.0 3.5 - 5.1 mEq/L    Chloride 104 98 - 107  mEq/L    CO2 24 21 - 32 mEq/L    Glucose 319 (H) 74 - 106 mg/dl    BUN 17 7 - 25 mg/dl    Creatinine 1.1 0.6 - 1.3 mg/dl    GFR est AA >60.0      GFR est non-AA 53      Calcium 9.3 8.5 - 10.1 mg/dl    Anion gap 9 5 - 15 mmol/L    TROPONIN I    Collection Time: 11/15/17  2:03 AM   Result Value Ref Range    Troponin-I <0.015 0.000 - 0.045 ng/ml               Imaging:    Results from Hospital Encounter encounter on 11/14/17   XR CHEST SNGL V    Narrative Indication: dyspnea.    .      Impression IMPRESSION: Portable AP upright view the chest exposed at 8:13 PM November 14, 2017  reveals patchy infiltrates at the lung bases with groundglass granularity  consistent with emphysema and atelectasis similar to September 13, 2017. The heart is  of normal size.         Results from [place] encounter on 10/08/17   CTA CHEST W OR W WO CONT    Narrative DICOM format image data is available to non-affiliated external healthcare  facilities or entities on a secure, media free, reciprocally searchable basis  with patient authorization for 12 months following the date of the study.    Clinical history: Chest pain    EXAMINATION:  CTA chest with contrast. 3 mm spiral scanning is performed from the lung apices  to the upper poles of the kidneys. Coronal, sagittal and 3-D MIP sequences have  been obtained.    Correlation: 08/06/2017    FINDINGS:  There are degenerative changes of the spine. Trachea, right and left mainstem  bronchi are patent. There are bilateral axillary and compressive atelectasis.  1.8 x 1.8 cm well-circumscribed cavitary lesion left lower lobe not  significantly changed from 08/06/2017 and 11/03/2016 (increased in size from  07/03/2015, 1.3 x 1.3 cm).    2.3 cm right thyroid nodule, not significantly changed. The esophagus is not  dilated. Ascending aorta measures 4.4 cm. No lymph node enlargement in the  axilla, mediastinum or hila. No pulmonary embolism.    Visualized portions of the liver, gallbladder, pancreas and adrenal glands are  unremarkable. 13 mm indeterminate splenic lesion.      Impression IMPRESSION:  1. No pulmonary embolism.  2. Bilateral large bullae with compressive atelectasis.  3. 18 x 18 mm left lower lobe cavitary nodule  (not significantly changed from  prior study although increased in size from 07/03/2015 - 1.3 x 1.3 cm).  4. 2.3 cm right thyroid nodule.  5. 1.3 cm indeterminate splenic lesion.    Initial interpretation: [employer].            Nita Sells APRN, [place]  Critical Care Services Nurse Practitioner   Pager: [phone]  11/15/17  3:27 AM    I have reviewed Gray Bernhardt chart. I saw and examined the patient, personally performed the critical or key portions of the service and discussed the management with the treatment team. I reviewed the NP's note and concur with the history, findings and plan of care, which include's my edits and my assessment and plan. Laboratory, hemodynamic and radiological data were reviewed.     Admitted with COPD ( bullous emphysema) exacerbation  On IV  steroids and BIPAP + nebs  She is also very anxious      Donneta Romberg, MD

## 2017-11-15 NOTE — Progress Notes (Signed)
[id number] Verbal orders received to discontinue IV maintenance fluids as patient will be started on PO diet.    [id number] Patient with dry/nonproductive cough. Developed audible wheezing, HR increased to 140's-150's. Patient placed on bipap, demonstrating increased WOB, accessory muscle use. 02 sats maintaining in 90's patient diaphoretic, attempting sit forward in bed. NP notified, patient given breathing treatment and verbal orders received to give 2mg  IV ativan and 2mg  of magnesium sulfate.     0700 Patients HR remains in 130's, showing decreased WOB. Bedside and Verbal shift change report given to Olegario MessierKathy RN (oncoming nurse) by Hattie PerchNiveen Conliffe RN and Elisha PonderAngela RItter RN (offgoing nurse). Report included the following information SBAR, Kardex, Recent Results and Cardiac Rhythm ST.

## 2017-11-15 NOTE — Progress Notes (Signed)
Remains on BIPAP. Restless and anxious at times. Seems more comfortable now than earlier. Not tolerating nc at this time. Vitals stable.

## 2017-11-16 LAB — CBC WITH AUTO DIFFERENTIAL
Basophils %: 0.1 % (ref 0–3)
Eosinophils %: 0 % (ref 0–5)
Hematocrit: 39.9 % (ref 37.0–50.0)
Hemoglobin: 13 gm/dl (ref 13.0–17.2)
Immature Granulocytes: 0.3 % (ref 0.0–3.0)
Lymphocytes %: 10.8 % — ABNORMAL LOW (ref 28–48)
MCH: 28.9 pg (ref 25.4–34.6)
MCHC: 32.6 gm/dl (ref 30.0–36.0)
MCV: 88.7 fL (ref 80.0–98.0)
MPV: 10.8 fL — ABNORMAL HIGH (ref 6.0–10.0)
Monocytes %: 5.2 % (ref 1–13)
Neutrophils %: 83.6 % — ABNORMAL HIGH (ref 34–64)
Nucleated RBCs: 0 (ref 0–0)
Platelets: 251 10*3/uL (ref 140–450)
RBC: 4.5 M/uL (ref 3.60–5.20)
RDW-SD: 43.3 (ref 36.4–46.3)
WBC: 12 10*3/uL — ABNORMAL HIGH (ref 4.0–11.0)

## 2017-11-16 LAB — COMPREHENSIVE METABOLIC PANEL
ALT: 18 U/L (ref 12–78)
AST: 11 U/L — ABNORMAL LOW (ref 15–37)
Albumin: 4 gm/dl (ref 3.4–5.0)
Alkaline Phosphatase: 78 U/L (ref 45–117)
Anion Gap: 10 mmol/L (ref 5–15)
BUN: 18 mg/dl (ref 7–25)
CO2: 26 mEq/L (ref 21–32)
Calcium: 9.8 mg/dl (ref 8.5–10.1)
Chloride: 100 mEq/L (ref 98–107)
Creatinine: 1.2 mg/dl (ref 0.6–1.3)
EGFR IF NonAfrican American: 48
GFR African American: 58
Glucose: 390 mg/dl — ABNORMAL HIGH (ref 74–106)
Potassium: 4.6 mEq/L (ref 3.5–5.1)
Sodium: 135 mEq/L — ABNORMAL LOW (ref 136–145)
Total Bilirubin: 0.7 mg/dl (ref 0.2–1.0)
Total Protein: 8.5 gm/dl — ABNORMAL HIGH (ref 6.4–8.2)

## 2017-11-16 LAB — POCT GLUCOSE
POC Glucose: 248 mg/dL — ABNORMAL HIGH (ref 65–105)
POC Glucose: 282 mg/dL — ABNORMAL HIGH (ref 65–105)
POC Glucose: 291 mg/dL — ABNORMAL HIGH (ref 65–105)
POC Glucose: 250 mg/dL — ABNORMAL HIGH (ref 65–105)

## 2017-11-16 LAB — PHOSPHORUS
Phosphorus: 3.6 mg/dl (ref 2.5–4.9)
Phosphorus: 3.6 mg/dl (ref 2.5–4.9)

## 2017-11-16 LAB — METABOLIC PANEL, COMPREHENSIVE
ALT (SGPT): 18 U/L (ref 12–78)
AST (SGOT): 11 U/L — ABNORMAL LOW (ref 15–37)
Albumin: 4 gm/dl (ref 3.4–5.0)
Alk. phosphatase: 78 U/L (ref 45–117)
Anion gap: 10 mmol/L (ref 5–15)
BUN: 18 mg/dl (ref 7–25)
Bilirubin, total: 0.7 mg/dl (ref 0.2–1.0)
CO2: 26 mEq/L (ref 21–32)
Calcium: 9.8 mg/dl (ref 8.5–10.1)
Chloride: 100 mEq/L (ref 98–107)
Creatinine: 1.2 mg/dl (ref 0.6–1.3)
GFR est AA: 58
GFR est non-AA: 48
Glucose: 390 mg/dl — ABNORMAL HIGH (ref 74–106)
Potassium: 4.6 mEq/L (ref 3.5–5.1)
Protein, total: 8.5 gm/dl — ABNORMAL HIGH (ref 6.4–8.2)
Sodium: 135 mEq/L — ABNORMAL LOW (ref 136–145)

## 2017-11-16 LAB — CBC WITH AUTOMATED DIFF
BASOPHILS: 0.1 % (ref 0–3)
EOSINOPHILS: 0 % (ref 0–5)
HCT: 39.9 % (ref 37.0–50.0)
HGB: 13 gm/dl (ref 13.0–17.2)
IMMATURE GRANULOCYTES: 0.3 % (ref 0.0–3.0)
LYMPHOCYTES: 10.8 % — ABNORMAL LOW (ref 28–48)
MCH: 28.9 pg (ref 25.4–34.6)
MCHC: 32.6 gm/dl (ref 30.0–36.0)
MCV: 88.7 fL (ref 80.0–98.0)
MONOCYTES: 5.2 % (ref 1–13)
MPV: 10.8 fL — ABNORMAL HIGH (ref 6.0–10.0)
NEUTROPHILS: 83.6 % — ABNORMAL HIGH (ref 34–64)
NRBC: 0 (ref 0–0)
PLATELET: 251 10*3/uL (ref 140–450)
RBC: 4.5 M/uL (ref 3.60–5.20)
RDW-SD: 43.3 (ref 36.4–46.3)
WBC: 12 10*3/uL — ABNORMAL HIGH (ref 4.0–11.0)

## 2017-11-16 LAB — GLUCOSE, POC
Glucose (POC): 248 mg/dL — ABNORMAL HIGH (ref 65–105)
Glucose (POC): 282 mg/dL — ABNORMAL HIGH (ref 65–105)
Glucose (POC): 291 mg/dL — ABNORMAL HIGH (ref 65–105)
Glucose (POC): 250 mg/dL — ABNORMAL HIGH (ref 65–105)

## 2017-11-16 MED ORDER — DILTIAZEM ER 180 MG 24 HR CAP
180 mg | Freq: Every day | ORAL | Status: DC
Start: 2017-11-16 — End: 2017-11-18
  Administered 2017-11-16 – 2017-11-18 (×3): via ORAL

## 2017-11-16 MED ORDER — LOSARTAN 50 MG TAB
50 mg | Freq: Every day | ORAL | Status: DC
Start: 2017-11-16 — End: 2017-11-18
  Administered 2017-11-16 – 2017-11-18 (×3): via ORAL

## 2017-11-16 MED ORDER — LEVALBUTEROL 1.25 MG/3 ML SOLN FOR INHALATION
1.25 mg/3 mL | Freq: Four times a day (QID) | RESPIRATORY_TRACT | Status: DC
Start: 2017-11-16 — End: 2017-11-17
  Administered 2017-11-16 – 2017-11-18 (×6): via RESPIRATORY_TRACT

## 2017-11-16 MED FILL — LOSARTAN 50 MG TAB: 50 mg | ORAL | Qty: 2

## 2017-11-16 MED FILL — SOLU-MEDROL (PF) 40 MG/ML SOLUTION FOR INJECTION: 40 mg/mL | INTRAMUSCULAR | Qty: 1

## 2017-11-16 MED FILL — QUETIAPINE SR 50 MG 24 HR TAB: 50 mg | ORAL | Qty: 3

## 2017-11-16 MED FILL — POLYETHYLENE GLYCOL 3350 17 GRAM (100 %) ORAL POWDER PACKET: 17 gram | ORAL | Qty: 1

## 2017-11-16 MED FILL — HEPARIN (PORCINE) 5,000 UNIT/ML IJ SOLN: 5000 unit/mL | INTRAMUSCULAR | Qty: 1

## 2017-11-16 MED FILL — SPIRONOLACTONE 100 MG TAB: 100 mg | ORAL | Qty: 1

## 2017-11-16 MED FILL — CLONIDINE 0.1 MG TAB: 0.1 mg | ORAL | Qty: 1

## 2017-11-16 MED FILL — DILTIAZEM ER 180 MG 24 HR CAP: 180 mg | ORAL | Qty: 1

## 2017-11-16 MED FILL — LEVALBUTEROL 1.25 MG/3 ML SOLN FOR INHALATION: 1.25 mg/3 mL | RESPIRATORY_TRACT | Qty: 3

## 2017-11-16 MED FILL — TRAZODONE 50 MG TAB: 50 mg | ORAL | Qty: 1

## 2017-11-16 MED FILL — AZITHROMYCIN 500 MG IV SOLUTION: 500 mg | INTRAVENOUS | Qty: 5

## 2017-11-16 MED FILL — ROSUVASTATIN 20 MG TAB: 20 mg | ORAL | Qty: 1

## 2017-11-16 MED FILL — INSULIN LISPRO 100 UNIT/ML INJECTION: 100 unit/mL | SUBCUTANEOUS | Qty: 1

## 2017-11-16 MED FILL — MAGNESIUM OXIDE 400 MG TAB: 400 mg | ORAL | Qty: 1

## 2017-11-16 MED FILL — MONTELUKAST 10 MG TAB: 10 mg | ORAL | Qty: 1

## 2017-11-16 MED FILL — DILTIAZEM HCL 5 MG/ML IV SOLN: 5 mg/mL | INTRAVENOUS | Qty: 25

## 2017-11-16 NOTE — Progress Notes (Signed)
OT goals initiated 11/16/2017 and will be met by patient within 10 sessions     -  Patient will be modified independent with lower body ADLs using adaptive equipment and/or DME as needed.  -  Patient will perform toileting tasks with modified independent with adaptive equipment and/or DME as needed and  Good safety to reduce falls risk.  -  Patient will be modified independent with functional transfers with Good safety to reduce falls risk.  -  Patient will perform dynamic standing balance activities for improved ADL/IADL function with modified independent   -  Patient will tolerate standing for 30 minutes to promote maximal independence in ADLs.  - Patient will demonstrate good activity tolerance during functional activities.  -  Patient will tolerate sitting up in chair for 1-2 hours/day to improve/maintain respiratory capacity and endurance for carryover to functional tasks.   -  Patient will perform 10 repetitions of active and/or active/assist range of motion exercises for bilateral upper extremity(s) to improve/maintain ROM, strength and skin integrity.   -  Patient and/or cargegiver will be instructed in bathroom and home safety, and AE and/or DME options for ADLs, as needed.  Patient and/or caregiver will verbalize understanding.  - Patient and/or caregiver will be instructed in energy conservation techniques, as needed.   Patient will problem solve use of these techniques during functional activities.  Patient and/or caregiver will verbalize understanding.    OCCUPATIONAL THERAPY EVALUATION     Patient: [name] [name] (67 y.o. female)  Room: B716/R678    Date: 11/16/2017  Start Time:  1330        End Time:  1345    Primary Diagnosis: COPD exacerbation ([place]) [J44.1]       Admit date: 11/14/2017  Insurance: Payor: [employer] MEDICARE / Plan: [place] / Product Type: Managed Care Medicare /      Precautions: Falls.      Ordered Weight Bearing Status: None      ASSESSMENT :   Based on the objective data described below, the patient presents with   - generalized muscle weakness affecting function in ADLs  - decreased balance    - decreased functional mobility   - decreased activity tolerance, increased fatigue and/or shortness of breathe   affecting patient's safety and independence/ability to perform basic ADLs/IADLs    Patient will benefit from skilled occupational therapy intervention to address the above impairments.     Patient???s rehabilitation potential is considered to be Good for above stated goals.      PLAN:  Patient will be followed by occupational therapy to address goals while hospitalized as patient's status and schedule permit. (1-5x/wk x 2 weeks)  Planned Interventions: ADI training, activity tolerance, functional balance training, functional mobility training, therapeutic exercise, therapeutic activity, patient/caregiver education and training and home exercise program.  Discharge Recommendations: patient plans on going home.  Further Equipment Recommendations for Discharge: to be determined  (Patient and/or family have participated as able in goal setting and plan of care.)     Please refer to Patient Education and Care Plan sections of chart for additional details.    Education/ communication:     Barriers to learning/limitations:  None  Education provided to: patient on (+) role of OT, (+) OT plan of care, (+) instructed patient on the importance of activity while hospitalized to prevent a decline in function  Educational handouts issued: none this session  Patient / family response to education: verbalized understanding    SUBJECTIVE:  Patient " I want some food"    OBJECTIVE DATA SUMMARY:     Orders, labs, and chart reviewed on [facility]. Discussed with Debarah Crape, RN.    Last O2 entry in flow sheets:  O2 Device: Room air (11/16/17 1348)  O2 Flow Rate (L/min): 2 l/min (11/16/17 1008)    Patient was admitted to the hospital on 11/14/2017 with   Chief Complaint    Patient presents with   ??? COPD   ??? Shortness of Breath     Present illness history:   Patient Active Problem List    Diagnosis Date Noted   ??? COPD exacerbation ([place]) 11/14/2017   ??? SIRS (systemic inflammatory response syndrome) ([place]) 10/09/2017   ??? Hyperkalemia 09/08/2017   ??? Obtunded 09/08/2017   ??? Acute renal failure (ARF) ([place]) 09/08/2017   ??? Septic shock ([place]) 14/78/2956   ??? Metabolic encephalopathy 21/30/8657   ??? Dehydration 08/07/2017   ??? COPD with acute exacerbation ([place]) 08/07/2017   ??? Acute-on-chronic kidney injury ([place]) 08/07/2017   ??? Chest pain 06/15/2017   ??? Dyspnea 06/15/2017   ??? Type 2 diabetes mellitus with diabetic neuropathy ([place]) 12/07/2016   ??? 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 Cannabinoid hyperemesis syndrome ([place]) 08/17/2016   ??? Gastritis 08/16/2016   ??? Nausea & vomiting 08/16/2016   ??? Asthma with acute exacerbation 08/04/2016   ??? Lactic acidosis 07/24/2016   ??? Leukocytosis 07/24/2016   ??? Sepsis ([place]) 07/24/2016   ??? Asthma exacerbation 07/24/2016   ??? Type 2 diabetes mellitus with nephropathy ([place]) 06/12/2016   ??? Sacroiliitis ([place]) 11/25/2015   ??? Essential hypertension 10/06/2015   ??? 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 Lumbar and sacral osteoarthritis 09/15/2015   ??? Chronic pain syndrome 09/15/2015   ??? Type 2 diabetes mellitus with hyperglycemia, without long-term current use of insulin ([place]) 08/20/2015   ??? Acute colitis 07/02/2015   ??? Acute hyperglycemia 07/02/2015   ??? Accelerated hypertension 04/08/2015   ??? Severe headache 04/07/2015   ??? 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 PTSD (post-traumatic stress disorder) 01/29/2015   ??? Severe hypertension 07/21/2014      Previous medical history:   Past Medical History:   Diagnosis Date   ??? Arthritis    ??? 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 COPD (chronic obstructive pulmonary disease) ([place])    ??? DM2 (diabetes mellitus, type 2) ([place])     ??? GERD (gastroesophageal reflux disease)    ??? Hepatitis C     HEP C   ??? HTN (hypertension)    ??? Lung nodule, multiple     (CT 10/2016) Small left upper lobe and 1.7 x 1.6 cm left lower lobe nodules not significantly changed from 07/23/2016 and 03/22/2016   ??? Marijuana use    ??? PTSD (post-traumatic stress disorder)     lived through the [place] in 1993   ??? Thoracic ascending aortic aneurysm ([place])     4.4cm noted on CT Chest (10/2016)   ??? Thyroid nodule     2.5 cm stable right thyroid nodule (CT 10/2016)       Patient found: bed, (+) bed/chair alarm, (+) oxygen, (+) ICU/step down equipment    Pain Assessment: None observed and None reported  Pain Location:  NA    Prior level of function / living situation status:     Information was obtained by:  patient  Home environment:   Patient lives with spouse in  a 2 story with a bedroom on 2nd floor.    Comment: (+) stair lift   Prior level of function:Patient requires assistance with lower body bathing.  Patient is independent with functional mobility.  Prior level of Instrumental Activities of Daily Living:   Patient is independent with preparing light meals/snack.    Cognitive status:     Mental status:   Orientation: Patient is oriented to person,place, month and year, Neurostate: awake, alert and pleasant  Communication: grossly intact  Attention Span:   good(>49mn)  Follows commands: intact; consistently follows commands  Safety/Judgement: good safety awareness    Activities of daily living status:   Based on direct observation, simulation and clinical assessment.  (clincial judgement based on functional mobility)    Eating:           - supervision, set-up  Grooming:     - supervision, set-up  UB bathing:   - supervision, set-up, comment: sponge bath  LB bathing:   - contact guard assistance, set-up, comment: sponge bath  UB dressing: - modified independent, set-up, comment: hospital gown  LB dressing: - supervision, set-up, comment: hospital socks   Toileting:       - minimum assistance    Comment(s):   -  Patient is physically able to perform feeding, grooming and upper body ADLs but, may require supervision in hospital setting for wire/line(s) management, set-up and safety.  -  Patient requires set - up for ADLs.  -  Patient performed ADLs at edge of bed level.    Functional mobility status:     Mobility:  Supine to sit -  modified independent  Sit to Stand -  contact guard  Stand to Sit -  contact guard     Transfers:  Functional transfers: contact guard    Comment(s):   none    Functional balance status:     Balance:  Static Sitting Balance -           good (-):  maintains balance against minimum resistance  Dynamic Sitting Balance -      good (-):  independent with basic dynamic balance activities   Static Standing Balance -       fair (+):    maintains balance with independence without cueing  Dynamic Standing Balance -  fair (+):    able to perform full UE ROM ranges with SBA/supervision     Activity Tolerance: tolerates functional activity with rest breaks, requires oxygen, requires rest breaks, reports fatigue after activity  Upper extremity status:     Dominance:right  RIGHT: ACTIVE [facility]  Strength is grossly graded as 4-/5: (Completes full range of motion against gravity with minimal to moderate resistance).  Comment: (+)  no c/o paresthesia  LEFT:   ACT[facility]  Strength is grossly graded as 4-/5: (Completes full range of motion against gravity with minimal to moderate resistance).  Comment: (+)  no c/o paresthesia    Final location:     Positioned in bed, all needs within reach, agrees to call for assistance, (+) bed/chair alarm, nurse present.     Evaluation Complexity: History: MEDIUM Complexity : Expanded review of history including physical, cognitive and psychosocial  history ; Examination: LOW Complexity : 1-3 performance deficits relating to  physical, cognitive , or psychosocial skils that result in activity limitations and / or participation restrictions ; Decision Making:LOW Complexity :  No comorbidities that affect functional and no verbal or physical assistance needed to complete eval tasks     Thank you for this referral.  Leighton Ruff, OTR/L

## 2017-11-16 NOTE — Progress Notes (Signed)
Medical Progress Note      NAME: [name] ShutterMonica R Regino   DOB:  09/15/1950  MRM:  [id number]    Date/Time: 11/16/2017  4:16 PM     Subjective:     67 year old woman with past history of COPD/asthma HTN DM II presents with increasing SOB despite using HHN's at home. She initially required BiPAP and has been weaned now [facility]. She has had HHNs and IV solumedrol. IV antibiotics have been started for treatment of pneumonia .she is feeling better but still SOB. She CO cough. She is resting on rounds but arouses easily and has no CO's/asking when she can go home.     Past Medical History reviewed and unchanged from Admission History and Physical    Review of Systems   Constitutional: Positive for fatigue.   HENT: Positive for congestion.    Eyes: Negative.    Respiratory: Positive for cough and shortness of breath.    Cardiovascular: Negative.    Gastrointestinal: Negative.    Endocrine: Negative.    Genitourinary: Negative.    Musculoskeletal: Negative.    Skin: Negative.    Neurological: Negative.    Hematological: Negative.    Psychiatric/Behavioral: Negative.             Objective:       Vitals:      Last 24hrs VS reviewed since prior progress note. Most recent are:    Visit Vitals  BP 121/76   Pulse (!) 103   Temp 98.2 ??F (36.8 ??C)   Resp 24   Ht 5\' 10"  (1.778 m)   Wt 86.2 kg (190 lb)   SpO2 93%   Breastfeeding? No   BMI 27.26 kg/m??     SpO2 Readings from Last 6 Encounters:   11/16/17 93%   11/07/17 90%   11/02/17 91%   10/11/17 98%   09/20/17 96%   09/14/17 99%    O2 Flow Rate (L/min): 2 l/min       Intake/Output Summary (Last 24 hours) at 11/16/2017 1159  Last data filed at 11/16/2017 1133  Gross per 24 hour   Intake 560.34 ml   Output 1200 ml   Net -639.66 ml          Exam:      Physical Exam   Nursing note and vitals reviewed.  Constitutional: She is oriented to person, place, and time. She appears well-developed and well-nourished.   HENT:   Head: Normocephalic and atraumatic.    Eyes: Pupils are equal, round, and reactive to light.   Neck: Normal range of motion.   Cardiovascular: Normal rate.   Pulmonary/Chest: She has wheezes. She has rales.   Abdominal: Soft. Bowel sounds are normal.   Musculoskeletal: Normal range of motion.   Neurological: She is alert and oriented to person, place, and time.   Skin: Skin is warm.       Telemetry reviewed:   SVT    Lab Data Reviewed: (see below)      Medications:  Current Facility-Administered Medications   Medication Dose Route Frequency   ??? dilTIAZem CD (CARDIZEM CD) capsule 180 mg  180 mg Oral DAILY   ??? losartan (COZAAR) tablet 100 mg  100 mg Oral DAILY   ??? levalbuterol (XOPENEX) nebulizer soln 1.25 mg/3 mL  1.25 mg Nebulization Q6H RT   ??? LORazepam (ATIVAN) injection 1-2 mg  1-2 mg IntraVENous Q6H PRN   ??? cloNIDine HCl (CATAPRES) tablet 0.3 mg  0.3 mg Oral BID   ???  benzonatate (TESSALON) capsule 100 mg  100 mg Oral TID PRN   ??? guaiFENesin (ROBITUSSIN) 100 mg/5 mL oral liquid 100 mg  100 mg Oral Q4H PRN   ??? guaiFENesin-codeine (ROBITUSSIN AC) 100-10 mg/5 mL solution 5 mL  5 mL Oral Q4H PRN   ??? fluticasone propionate (FLONASE) 50 mcg/actuation nasal spray 2 Spray  2 Spray Both Nostrils DAILY   ??? traZODone (DESYREL) tablet 50 mg  50 mg Oral QHS PRN   ??? QUEtiapine SR (SEROquel XR) tablet 150 mg  150 mg Oral QHS   ??? polyethylene glycol (MIRALAX) packet 17 g  17 g Oral DAILY   ??? magnesium oxide (MAG-OX) tablet 400 mg  400 mg Oral DAILY   ??? cefTRIAXone (ROCEPHIN) 1 g in 0.9% sodium chloride (MBP/ADV) 50 mL MBP  1 g IntraVENous Q24H   ??? azithromycin (ZITHROMAX) 500 mg in 0.9% sodium chloride 250 mL IVPB  500 mg IntraVENous Q24H   ??? fluticasone-vilanterol (BREO ELLIPTA) 225mcg-25mcg/puff  1 Puff Inhalation DAILY   ??? montelukast (SINGULAIR) tablet 10 mg  10 mg Oral QHS   ??? rosuvastatin (CRESTOR) tablet 20 mg  20 mg Oral QHS   ??? spironolactone (ALDACTONE) tablet 100 mg  100 mg Oral DAILY   ??? tiotropium bromide (SPIRIVA RESPIMAT) 2.5 mcg /actuation  2 Puff  Inhalation DAILY   ??? acetaminophen (TYLENOL) tablet 650 mg  650 mg Oral Q6H PRN   ??? PHOSPHATE ELECTROLYTE REPLACEMENT PROTOCOL STANDARD DOSING  1 Each Other PRN   ??? MAGNESIUM ELECTROLYTE REPLACEMENT PROTOCOL STANDARD DOSING  1 Each Other PRN   ??? POTASSIUM ELECTROLYTE REPLACEMENT PROTOCOL STANDARD DOSING  1 Each Other PRN   ??? CALCIUM ELECTROLYTE REPLACEMENT PROTOCOL STANDARD DOSING  1 Each Other PRN   ??? heparin (porcine) injection 5,000 Units  5,000 Units SubCUTAneous Q8H   ??? methylPREDNISolone (PF) (SOLU-MEDROL) injection 40 mg  40 mg IntraVENous Q6H   ??? dextrose (D50) infusion 5-25 g  10-50 mL IntraVENous PRN   ??? glucagon (GLUCAGEN) injection 1 mg  1 mg IntraMUSCular PRN   ??? insulin glargine (LANTUS) injection 1-100 Units  1-100 Units SubCUTAneous QHS   ??? insulin lispro (HUMALOG) injection 1-100 Units  1-100 Units SubCUTAneous AC&HS   ??? insulin lispro (HUMALOG) injection 1-100 Units  1-100 Units SubCUTAneous PRN       ______________________________________________________________________      Lab Review:     Recent Labs     11/16/17  0630 11/15/17  0203 11/14/17  1928   WBC 12.0* 10.9 22.4*   HGB 13.0 12.2* 13.9   HCT 39.9 37.7 41.2   PLT 251 258 398     Recent Labs     11/15/17  0203 11/14/17  2042 11/14/17  1928   NA 137  --  134*   K 5.0  --  4.6   CL 104  --  99   CO2 24 26.0 26   GLU 319*  --  379*   BUN 17  --  20   CREA 1.1  --  1.3   CA 9.3  --  9.8       Recent Labs     11/14/17  2042   PH 7.327*   PCO2 46.5*   PO2 159.0*   HCO3 24.5   FIO2 40          Assessment:     Active Problems:    COPD exacerbation (HCC) (11/14/2017)           Plan:  Risk of deterioration: medium             1. Acute respiratory failure with hypoxia and hypercapnea improved. Weaning o2   2. COPD/asthma exacerbation. Continue nebs/steroids  3. Lactic acidosis due to sepsis. Monitoring trend  4. Sepsis due to pneuomnia continue antibiotics. Continue NS  5. Pneumonia The current medical regimen is effective;  continue present  plan and medications.   6. Tachycardia due to bronchodilator side effect. Improved with IV cardizem and in transition to oral cardizem DC norvasc due to tachycardia. Continue cardizem CD. Adjust as needed.   7. LLL pulmonary nodule. Stable.   8. HTN. Continue clonidine and ACE. Adding IV cardizem for now due to elevated HR  9. Continue Breo/singulair/spiriva  10. Hyperglycemia. Continue glucommander protocol  11. DVT prophylaxis with subQ heparin  12. ADA diet  13. Full code status             Total time spent with patient: 36 Minutes                  Care Plan discussed with: Patient, Family, Care Manager, Nursing Staff, Consultant/Specialist and >50% of time spent in counseling and coordination of care    Discussed:  Care Plan    Prophylaxis:  Hep SQ and SCD's    Disposition:  Home w/Family and HH PT, OT, RN           ___________________________________________________    Attending Physician: Marlene Bast, MD

## 2017-11-16 NOTE — Progress Notes (Signed)
physical Therapy EVALUATION    Patient: [name] [name] (67 y.o. female)  Room: Y782/N562    Date: 11/16/2017  Start Time:  0946  End Time:  1004    Primary Diagnosis: COPD exacerbation (HCC) [J44.1]         Precautions: Falls..  Weight bearing precautions: None    Orders reviewed, chart reviewed, and initial evaluation completed on [name] [name]. Spoke with RN Tisa     ASSESSMENT :  Based on the objective data described below, the patient presents with Decreased Strength  Decreased Activity Tolerance  Decreased Pacing Skills  Increased Fatigue  Increased Shortness of Breath  pt on 3L oxygen via NC, O2 sats decreased to 87% with OOB activity, recovered to 99% with sit in chair . Pt fatigues easily. Diminished endurance.     Patient will benefit from skilled intervention to address the above impairments.  Patient???s rehabilitation potential is considered to be Excellent        PLAN :  Planned Interventions:  Functional mobility training Nutritional therapist Therapeutic exercises Therapeutic activities Neuro muscular re-education AD training Patient/caregiver education    Frequency/Duration: Patient will be followed by physical therapy 3x / Week to address goals.    Recommendations:  Physical Therapy and Occupational Therapy  Discharge Recommendations: Home with home health PT, TBD, pending progress and Home Independent pt would benefit from outpatient Pulmonary rehab  Further Equipment Recommendations for Discharge: tbd        SUBJECTIVE:   Patient: agreeable to PT, reports breathing " good" c/o headache     OBJECTIVE DATA SUMMARY:   Present illness history:   Problem List  Date Reviewed: 12-11-17          Codes Class Noted    COPD exacerbation (HCC) ICD-10-CM: J44.1  ICD-9-CM: 491.21  11/14/2017        SIRS (systemic inflammatory response syndrome) (HCC) ICD-10-CM: R65.10  ICD-9-CM: 995.90  10/09/2017        Hyperkalemia ICD-10-CM: E87.5  ICD-9-CM: 276.7  09/08/2017         Obtunded ICD-10-CM: R40.1  ICD-9-CM: 780.09  09/08/2017        Acute renal failure (ARF) (HCC) ICD-10-CM: N17.9  ICD-9-CM: 584.9  09/08/2017        Septic shock (HCC) ICD-10-CM: A41.9, R65.21  ICD-9-CM: 038.9, 785.52, 995.92  09/08/2017        Metabolic encephalopathy ICD-10-CM: G93.41  ICD-9-CM: 348.31  09/08/2017        Dehydration ICD-10-CM: E86.0  ICD-9-CM: 276.51  08/07/2017        COPD with acute exacerbation (HCC) ICD-10-CM: J44.1  ICD-9-CM: 491.21  08/07/2017        Acute-on-chronic kidney injury (HCC) ICD-10-CM: N17.9, N18.9  ICD-9-CM: 584.9, 585.9  08/07/2017        Chest pain ICD-10-CM: R07.9  ICD-9-CM: 786.50  06/15/2017        Dyspnea ICD-10-CM: R06.00  ICD-9-CM: 786.09  06/15/2017        Type 2 diabetes mellitus with diabetic neuropathy (HCC) ICD-10-CM: E11.40  ICD-9-CM: 250.60, 357.2  12/07/2016        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  ICD-9-CM: 569.89  08/17/2016        Cannabinoid hyperemesis syndrome (HCC) ICD-10-CM: Z30.865  ICD-9-CM: 536.2, 305.20  08/17/2016        Gastritis ICD-10-CM: K29.70  ICD-9-CM: 535.50  08/16/2016        Nausea & vomiting ICD-10-CM: R11.2  ICD-9-CM: 787.01  08/16/2016  Asthma with acute exacerbation ICD-10-CM: J45.901  ICD-9-CM: 493.92  08/04/2016        Lactic acidosis ICD-10-CM: E87.2  ICD-9-CM: 276.2  07/24/2016        Leukocytosis ICD-10-CM: D72.829  ICD-9-CM: 288.60  07/24/2016        Sepsis (HCC) ICD-10-CM: A41.9  ICD-9-CM: 038.9, 995.91  07/24/2016        Asthma exacerbation ICD-10-CM: J45.901  ICD-9-CM: 493.92  07/24/2016        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: E11.21  ICD-9-CM: 250.40, 583.81  06/12/2016        Sacroiliitis (HCC) ICD-10-CM: M46.1  ICD-9-CM: 720.2  11/25/2015        Essential hypertension ICD-10-CM: I10  ICD-9-CM: 401.9  10/06/2015        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  ICD-9-CM: 721.3  09/15/2015        Lumbar and sacral osteoarthritis ICD-10-CM: M47.817   ICD-9-CM: 721.3  09/15/2015        Chronic pain syndrome ICD-10-CM: G89.4  ICD-9-CM: 338.4  09/15/2015        Type 2 diabetes mellitus with hyperglycemia, without long-term current use of insulin (HCC) ICD-10-CM: E11.65  ICD-9-CM: 250.00, 790.29  08/20/2015        Acute colitis ICD-10-CM: K52.9  ICD-9-CM: 558.9  07/02/2015        Acute hyperglycemia ICD-10-CM: R73.9  ICD-9-CM: 790.29  07/02/2015        Accelerated hypertension ICD-10-CM: I10  ICD-9-CM: 401.0  04/08/2015        Severe headache ICD-10-CM: R51  ICD-9-CM: 784.0  04/07/2015        Osteoarthritis of hips, 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 Arthritis    ??? 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 COPD (chronic obstructive pulmonary disease) (HCC)    ??? DM2 (diabetes mellitus, type 2) (HCC)    ??? GERD (gastroesophageal reflux disease)    ??? Hepatitis C     HEP C   ??? HTN (hypertension)    ??? Lung nodule, multiple     (CT 10/2016) Small left upper lobe and 1.7 x 1.6 cm left lower lobe nodules not significantly changed from 07/23/2016 and 03/22/2016   ??? Marijuana use    ??? PTSD (post-traumatic stress disorder)     [place]   ??? Thoracic ascending aortic aneurysm (HCC)     4.4cm noted on CT Chest (10/2016)   ??? Thyroid nodule     2.5 cm stable right thyroid nodule (CT 10/2016)       Prior Level of Function/Home Situation:   Home environment: Lives with Spouse, Bedroom 2nd floor, chair lift on stairs .  Prior level of function: Retired and pt reports sometimes needing assist from spouse with ADLs   Prior level of Activities of Daily Living: Independent with, light housework, preparing full meals and preparing light meals/snack    Home equipment:       Patient found: EOB, ICU/stepdown equip, Oxygen, IV and Bed alarm.  Pain Assessment before PT session: Not rated  Pain Location:  headache  Pain Assessment after PT session:   Pain Location:    []           Yes, patient had pain medications  []           No, Patient has not had pain medications  [x]           Nurse notified    COGNITIVE STATUS:     Mental Status: Oriented x3 and pleasant.  Communication: grossly intact.  Attention span: good(>12min).  Follows commands: intact.  General Cognition: intact .  Hearing: grossly intact.  Vision:  grossly intact.      EXTREMITIES ASSESSMENT:      Tone & Sensation:   Normal  0 =No increase in muscle tone  Proprioception:   Intact    Strength:    UEs grossly 4+/5  LEs iliopsoas 4/5 hams/quads 5/5 DF 5/5     Range Of Motion:  wfl     Functional mobility and balance status:     Sit to Stand -  contact guard and min. assist  Stand to Sit -  supervision and standby assist      Balance:   Static sitting balance: good  Dynamic sitting balance: good  Static standing balance: good-  Dynamic standing balance: good-  Single leg stance:   Ambulation/Gait Training:  steady and decreased cadence Walker standby assist and contact guard6 feet    Stair Training:     steps    Therapeutic Exercises:    Patient received/participated in  minutes of treatment (therapeutic exercises/activities) immediately following evaluation and/or educational instruction during/immediately following PT evaluation.    Lower Extremities:    Upper Extremities:        Balance Activities:   Activity Tolerance:   fair+ and observed SOB with activity    Final Location:   bedside chair, chair alarm, all needs close, agrees to call for assistance and nurse notified     COMMUNICATION/EDUCATION:   Education: Patient, Benefit of activity while hospitalized, Call for assistance, Staff assistance with mobility, Changes positions frequently,  Sit out of bed for 45-60 minutes or as tolerated, Energy conservation, Safety, Functional mobility, Verbalized understanding and Role of PT  Barriers to Learning/Limitations: None    Please refer to care plan and patient education section for further details.    Thank you for this referral.  Dannielle Huh, PT

## 2017-11-16 NOTE — Progress Notes (Signed)
TRANSFER - OUT REPORT:    Verbal report given to Jacqueline RN(name) on [name] [name]  being transferred to 2west(unit) for routine progression of care       Report consisted of patient???s Situation, Background, Assessment and   Recommendations(SBAR).     Information from the following report(s) SBAR, Intake/Output, Recent Results and Cardiac Rhythm SR was reviewed with the receiving nurse.    Lines:   Peripheral IV 11/15/17 Left;Lower;Inner Forearm (Active)   Site Assessment Clean, dry, & intact 11/16/2017  7:35 AM   Phlebitis Assessment 0 11/16/2017  7:35 AM   Infiltration Assessment 0 11/16/2017  7:35 AM   Dressing Status Clean, dry, & intact 11/16/2017  7:35 AM   Dressing Type Transparent 11/16/2017  7:35 AM   Hub Color/Line Status Pink;Patent 11/16/2017  7:35 AM   Action Taken Open ports on tubing capped 11/16/2017  7:35 AM   Alcohol Cap Used Yes 11/16/2017  7:35 AM       Peripheral IV 11/15/17 Left;Upper Cephalic (Active)   Site Assessment Clean, dry, & intact 11/16/2017  7:35 AM   Phlebitis Assessment 0 11/16/2017  7:35 AM   Infiltration Assessment 0 11/16/2017  7:35 AM   Dressing Status Clean, dry, & intact 11/16/2017  7:35 AM   Dressing Type Transparent 11/16/2017  7:35 AM   Hub Color/Line Status Pink;Patent 11/16/2017  7:35 AM   Action Taken Open ports on tubing capped 11/16/2017  7:35 AM   Alcohol Cap Used Yes 11/16/2017  7:35 AM        Opportunity for questions and clarification was provided.      Patient transported with:   Monitor  Registered Nurse

## 2017-11-16 NOTE — Other (Signed)
PHYSICIAN???S DOCUMENTATION REQUEST   This Form is Not a Permanent Document in the Medical Record     By submitting this query, we are merely seeking further clarification of documentation to accurately reflect all conditions that you are monitoring, evaluating, treating or that extend the hospitalization or utilize additional resources of care. Please utilize your independent clinical judgment when addressing the question(s) below.    Dear Dr. Fara ChuteHolladay,    Documentation by ED Physician states patient presents with COPD/Asthma exacerbation and Acute Respiratory Failure requiring BiPAP.  Diagnoses include Pneumonia.   Antibiotics started: Rocephin and Zithromax.  Admit to ICU.    Documentation by Hospitalist states Sepsis due to Pneumonia. Lactic Acidosis due to Sepsis.    Clinical findings: Lactic Acid  4      ProCal  < 0.05      Plt  398       WBC 22.4       Cr  1.3                                VS: afebrile  98.1   HR 102 - 133    RR  16 - 25                                   Sats- one reading of 86% on RA -  then 97 - 99% on RA                                ABG per ED Physician reveals Respiratory Acidosis    Urine antigen for Pneumonia and Legionella negative    CXR on 6/4: Patchy infiltrates in lung bases.    Treatment includes BiPAP and Antibiotics. Trending Lactic Acid.  .............................................................................................................................................  Please include clinical indicators that support the diagnosis of Sepsis:    ? Sepsis is a current diagnosis. Clinical indicators supporting this diagnosis include_______________________________, please document all indicators.    ?  Sepsis has been [employer]  and is not a current diagnosis    ?? Other, please specify with supporting clinical indicators    ? Unable to provide additional clinical indicators that support the diagnosis of Sepsis     PLEASE DOCUMENT ANY ADDITIONAL DIAGNOSES AND/OR SPECIFICITY IN THE PROGRESS NOTES AND/OR DISCHARGE SUMMARY    Thank you,  Deb Cox    Debby Cox, BSN, RN, CCDS  [facility]  Clinical Documentation Specialist  Cell  [phone]

## 2017-11-16 NOTE — Other (Signed)
0730 Bedside and Verbal shift change report given to Environmental consultantTisa RN (oncoming nurse) by Hattie PerchNiveen Conliffe RN and Elisha PonderAngela Ritter RN (offgoing nurse). Report included the following information SBAR, Kardex, ED Summary, Intake/Output, MAR and Cardiac Rhythm NSR.

## 2017-11-16 NOTE — Other (Signed)
Patient on unit in bed, husband at bedside. Bed in low position, wheels locked, no c/o pain or distress at the moment. Patient purewick placed, tele verified, call light within reach. Will continue to monitor.

## 2017-11-16 NOTE — Other (Signed)
Bedside and Verbal shift change report given to Imagene ShellerShamir R Isidro, RN   (Cabin crewoncoming nurse) by Arby BarretteJaclyn,RN (offgoing nurse). Report included the following information SBAR, Kardex, Intake/Output, Recent Results and Cardiac Rhythm SR/ST.

## 2017-11-16 NOTE — Progress Notes (Addendum)
[facility]      Progress Note    Name: [name] [name]   DOB: 09/20/1950   MRN: [id number]   Date: 11/16/2017    [x] I have reviewed the flowsheet and previous day???s notes. Events, vitals, medications and notes from last 24 hours reviewed.     ASSESSMENT:   -Acute hypercapnic respiratory failure requiring BiPAP  -COPD/Asthma Exacerbation: procalcitonin <0.05  -Lactic acidosis: 3.48 on admission  -Hyperglycemia  -Bilateral bullous emphysema: Chest Xray revealed patchy infiltrates with ground glass granularity consistent with emphysema and atelectasis  -Stable LLL pulmonary nodule 18 mm x 16mm  -Hypertension on Norvasc and Clonidine as an outpatient  -History of Hepatitis  -History of depression  -History of GERD       PLAN:   -Transitioned to NC Supplemental O2 this am.  Titrate to maintain SaO2 >90%.  - She was on BIPAP most of the day and night  - Tachycardia much better this am  -Continue on PRN nebs and Solu-Medrol Q6 hours  -Continue on Ceftriaxone and Azithromax for empiric coverage.    -discontinue IVF  -tolerating  Regular diet.   -Accuchecks AC/HS  -Insulin per Glucommander protocol.  -Electrolytes and labs per replacement protocol.  -Heparin SQ for DVT prophylaxis  -CBC, BMP in am.     Ok to transfer to med surg with BIPAP prn and QHS    Discussed with the nurse and RT.  CCT: 32 min                 Subjective: No new issues, feels so much better this am, smiling and joking, calmer today    ROS: no change    Allergy:  Allergies   Allergen Reactions   ??? Chocolate [Cocoa] Sneezing        Vital Signs:    Visit Vitals  BP 96/73   Pulse 93   Temp 98.2 ??F (36.8 ??C)   Resp 18   Ht 5\' 10"  (1.778 m)   Wt 86.2 kg (190 lb)   SpO2 94%   Breastfeeding? No   BMI 27.26 kg/m??       O2 Device: Humidifier, Nasal cannula   O2 Flow Rate (L/min): 2 l/min   Temp (24hrs), Avg:98.8 ??F (37.1 ??C), Min:97.6 ??F (36.4 ??C), Max:99.7 ??F (37.6 ??C)       Patient Vitals for the past 8 hrs:    Temp Pulse Resp BP SpO2   11/16/17 1201 ??? 93 18 96/73 94 %   11/16/17 1133 98.2 ??F (36.8 ??C) ??? ??? ??? ???   11/16/17 1103 ??? (!) 103 24 ??? 93 %   11/16/17 1101 ??? ??? ??? 121/76 94 %   11/16/17 1008 ??? (!) 116 21 140/87 98 %   11/16/17 1005 ??? (!) 115 ??? 140/87 ???   11/16/17 0902 ??? 86 18 100/73 97 %   11/16/17 0802 98.7 ??F (37.1 ??C) 98 20 117/88 94 %   11/16/17 0703 ??? (!) 114 23 (!) 154/97 95 %   11/16/17 0600 ??? 99 19 (!) 153/99 99 %     Intake/Output:   Last shift:      06/06 0701 - 06/06 1900  In: 13.9 [I.V.:13.9]  Out: -   Last 3 shifts: 06/04 1901 - 06/06 0700  In: 2190.2 [P.O.:550; I.V.:1640.2]  Out: 3075 [Urine:3075]    Intake/Output Summary (Last 24 hours) at 11/16/2017 1329  Last data filed at 11/16/2017 1133  Gross per 24 hour  Intake 560.34 ml   Output 1200 ml   Net -639.66 ml       Oxygen Settings:  Mode Rate Tidal Volume Pressure FiO2 PEEP            28 %       Peak airway pressure:      Minute ventilation: 9.3 l/min    Physical Exam:  HEENT: NCAT, PERRLA  Neck: supple  Chest: clear but greatly diminished BS  CVS:RRR  Abd: soft, NT, ND, BS++  Ext: no edema  Neuro: non focal exam      DATA:   Current Facility-Administered Medications   Medication Dose Route Frequency   ??? dilTIAZem CD (CARDIZEM CD) capsule 180 mg  180 mg Oral DAILY   ??? losartan (COZAAR) tablet 100 mg  100 mg Oral DAILY   ??? levalbuterol (XOPENEX) nebulizer soln 1.25 mg/3 mL  1.25 mg Nebulization Q6H RT   ??? LORazepam (ATIVAN) injection 1-2 mg  1-2 mg IntraVENous Q6H PRN   ??? cloNIDine HCl (CATAPRES) tablet 0.3 mg  0.3 mg Oral BID   ??? benzonatate (TESSALON) capsule 100 mg  100 mg Oral TID PRN   ??? guaiFENesin (ROBITUSSIN) 100 mg/5 mL oral liquid 100 mg  100 mg Oral Q4H PRN   ??? guaiFENesin-codeine (ROBITUSSIN AC) 100-10 mg/5 mL solution 5 mL  5 mL Oral Q4H PRN   ??? fluticasone propionate (FLONASE) 50 mcg/actuation nasal spray 2 Spray  2 Spray Both Nostrils DAILY   ??? traZODone (DESYREL) tablet 50 mg  50 mg Oral QHS PRN    ??? QUEtiapine SR (SEROquel XR) tablet 150 mg  150 mg Oral QHS   ??? polyethylene glycol (MIRALAX) packet 17 g  17 g Oral DAILY   ??? magnesium oxide (MAG-OX) tablet 400 mg  400 mg Oral DAILY   ??? cefTRIAXone (ROCEPHIN) 1 g in 0.9% sodium chloride (MBP/ADV) 50 mL MBP  1 g IntraVENous Q24H   ??? azithromycin (ZITHROMAX) 500 mg in 0.9% sodium chloride 250 mL IVPB  500 mg IntraVENous Q24H   ??? fluticasone-vilanterol (BREO ELLIPTA) 22800mcg-25mcg/puff  1 Puff Inhalation DAILY   ??? montelukast (SINGULAIR) tablet 10 mg  10 mg Oral QHS   ??? rosuvastatin (CRESTOR) tablet 20 mg  20 mg Oral QHS   ??? spironolactone (ALDACTONE) tablet 100 mg  100 mg Oral DAILY   ??? tiotropium bromide (SPIRIVA RESPIMAT) 2.5 mcg /actuation  2 Puff Inhalation DAILY   ??? acetaminophen (TYLENOL) tablet 650 mg  650 mg Oral Q6H PRN   ??? PHOSPHATE ELECTROLYTE REPLACEMENT PROTOCOL STANDARD DOSING  1 Each Other PRN   ??? MAGNESIUM ELECTROLYTE REPLACEMENT PROTOCOL STANDARD DOSING  1 Each Other PRN   ??? POTASSIUM ELECTROLYTE REPLACEMENT PROTOCOL STANDARD DOSING  1 Each Other PRN   ??? CALCIUM ELECTROLYTE REPLACEMENT PROTOCOL STANDARD DOSING  1 Each Other PRN   ??? heparin (porcine) injection 5,000 Units  5,000 Units SubCUTAneous Q8H   ??? methylPREDNISolone (PF) (SOLU-MEDROL) injection 40 mg  40 mg IntraVENous Q6H   ??? dextrose (D50) infusion 5-25 g  10-50 mL IntraVENous PRN   ??? glucagon (GLUCAGEN) injection 1 mg  1 mg IntraMUSCular PRN   ??? insulin glargine (LANTUS) injection 1-100 Units  1-100 Units SubCUTAneous QHS   ??? insulin lispro (HUMALOG) injection 1-100 Units  1-100 Units SubCUTAneous AC&HS   ??? insulin lispro (HUMALOG) injection 1-100 Units  1-100 Units SubCUTAneous PRN           Labs: Results:   Chemistry Recent Labs  11/16/17  1026 11/15/17  0203 11/14/17  2042 11/14/17  1928   GLU 390* 319*  --  379*   NA 135* 137  --  134*   K 4.6 5.0  --  4.6   CL 100 104  --  99   CO2 26 24 26.0 26   BUN 18 17  --  20   CREA 1.2 1.1  --  1.3   CA 9.8 9.3  --  9.8   AGAP 10 9  --  9    AP 78  --   --   --    TP 8.5*  --   --   --    ALB 4.0  --   --   --       CBC w/Diff Recent Labs     11/16/17  0630 11/15/17  0203 11/14/17  1928   WBC 12.0* 10.9 22.4*   RBC 4.50 4.29 4.70   HGB 13.0 12.2* 13.9   HCT 39.9 37.7 41.2   PLT 251 258 398   GRANS 83.6* 90.9* 50.5   LYMPH 10.8* 7.6* 42.9   EOS 0.0 0.0  --           All Micro Results     Procedure Component Value Units Date/Time    CULTURE, BLOOD [[id number]] Collected:  11/14/17 2307    Order Status:  Completed Specimen:  Blood Updated:  11/16/17 0726     Blood Culture Result No Growth At 24 Hours       CULTURE, BLOOD [[id number]] Collected:  11/14/17 2255    Order Status:  Completed Specimen:  Blood Updated:  11/16/17 0725     Blood Culture Result No Growth At 24 Hours       STREP PNEUMONIAE ANTIGEN, URINE [[id number]] Collected:  11/14/17 2320    Order Status:  Completed Specimen:  Urine Updated:  11/15/17 0102     Strep pneumo Ag, urine NEGATIVE        Comment: Presumptive negative for pneumococcal pneumonia, suggesting no current or recent pneumococcal  infection.  Infection due to S. pneumoniae cannot be ruled out since the antigen present in the  sample may be below the detection limit of the test.         LEGIONELLA PNEUMOPHILA AG, URINE [[id number]] Collected:  11/14/17 2320    Order Status:  Completed Specimen:  Urine Updated:  11/15/17 0102     Legionella Ag, urine NEGATIVE        Comment: Presumptive negative for L. pneumophila serogroup 1 antigen in urine, suggesting no recent or  current infection. Infection due to Legionella cannot be ruled out since other serogroups and  species may cause disease, antigen may not be present in urine in early infection and the level  of antigen present in the urine may be below the detection limit of the test.         CULTURE, RESPIRATORY/SPUTUM/BRONCH Berniece Andreas [[id number]]     Order Status:  No result Specimen:  Sputum             Imaging:  No results found.          Claudette Laws, MD  November 16, 2017   1:29 PM

## 2017-11-16 NOTE — Progress Notes (Signed)
Medical Progress Note      NAME: [name] [name]   DOB:  1951-03-22  MRM:  [id number]    Date/Time: 11/16/2017  4:16 PM         Subjective:     67 year old woman with past history of COPD/asthma HTN DM II presents with increasing SOB despite using HHN's at home. She initially required BiPAP and has been weaned now to [facility]. She has had HHNs and IV solumedrol. IV antibiotics have been started for treatment of pneumonia .she is feeling better but still SOB. She CO cough. She is resting on rounds but arouses easily and has no CO's/asking when she can go home.     Past Medical History reviewed and unchanged from Admission History and Physical    Review of Systems   Constitutional: Positive for fatigue.   HENT: Positive for congestion.    Eyes: Negative.    Respiratory: Positive for cough and shortness of breath.    Cardiovascular: Negative.    Gastrointestinal: Negative.    Endocrine: Negative.    Genitourinary: Negative.    Musculoskeletal: Negative.    Skin: Negative.    Neurological: Negative.    Hematological: Negative.    Psychiatric/Behavioral: Negative.             Objective:       Vitals:      Last 24hrs VS reviewed since prior progress note. Most recent are:    Visit Vitals  BP 121/76   Pulse (!) 103   Temp 98.2 ??F (36.8 ??C)   Resp 24   Ht 5\' 10"  (1.778 m)   Wt 86.2 kg (190 lb)   SpO2 93%   Breastfeeding? No   BMI 27.26 kg/m??     SpO2 Readings from Last 6 Encounters:   11/16/17 93%   11/07/17 90%   11/02/17 91%   10/11/17 98%   09/20/17 96%   09/14/17 99%    O2 Flow Rate (L/min): 2 l/min       Intake/Output Summary (Last 24 hours) at 11/16/2017 1159  Last data filed at 11/16/2017 1133  Gross per 24 hour   Intake 560.34 ml   Output 1200 ml   Net -639.66 ml          Exam:      Physical Exam   Nursing note and vitals reviewed.  Constitutional: She is oriented to person, place, and time. She appears well-developed and well-nourished.   HENT:   Head: Normocephalic and atraumatic.   Eyes: Pupils are equal, round, and reactive to  light.   Neck: Normal range of motion.   Cardiovascular: Normal rate.   Pulmonary/Chest: She has wheezes. She has rales.   Abdominal: Soft. Bowel sounds are normal.   Musculoskeletal: Normal range of motion.   Neurological: She is alert and oriented to person, place, and time.   Skin: Skin is warm.       Telemetry reviewed:   SVT    Lab Data Reviewed: (see below)      Medications:  Current Facility-Administered Medications   Medication Dose Route Frequency   ??? dilTIAZem CD (CARDIZEM CD) capsule 180 mg  180 mg Oral DAILY   ??? losartan (COZAAR) tablet 100 mg  100 mg Oral DAILY   ??? levalbuterol (XOPENEX) nebulizer soln 1.25 mg/3 mL  1.25 mg Nebulization Q6H RT   ??? LORazepam (ATIVAN) injection 1-2 mg  1-2 mg IntraVENous Q6H PRN   ??? cloNIDine HCl (CATAPRES) tablet 0.3 mg  0.3 mg Oral BID   ??? benzonatate (TESSALON) capsule 100 mg  100 mg Oral TID PRN   ??? guaiFENesin (ROBITUSSIN) 100 mg/5 mL oral liquid 100 mg  100 mg Oral Q4H PRN   ??? guaiFENesin-codeine (ROBITUSSIN AC) 100-10 mg/5 mL solution 5 mL  5 mL Oral Q4H PRN   ??? fluticasone propionate (FLONASE) 50 mcg/actuation nasal spray 2 Spray  2 Spray Both Nostrils DAILY   ??? traZODone (DESYREL) tablet 50 mg  50 mg Oral QHS PRN   ??? QUEtiapine SR (SEROquel XR) tablet 150 mg  150 mg Oral QHS   ??? polyethylene glycol (MIRALAX) packet 17 g  17 g Oral DAILY   ??? magnesium oxide (MAG-OX) tablet 400 mg  400 mg Oral DAILY   ??? cefTRIAXone (ROCEPHIN) 1 g in 0.9% sodium chloride (MBP/ADV) 50 mL MBP  1 g IntraVENous Q24H   ??? azithromycin (ZITHROMAX) 500 mg in 0.9% sodium chloride 250 mL IVPB  500 mg IntraVENous Q24H   ??? fluticasone-vilanterol (BREO ELLIPTA) 24600mcg-25mcg/puff  1 Puff Inhalation DAILY   ??? montelukast (SINGULAIR) tablet 10 mg  10 mg Oral QHS   ??? rosuvastatin (CRESTOR) tablet 20 mg  20 mg Oral QHS   ??? spironolactone (ALDACTONE) tablet 100 mg  100 mg Oral DAILY   ??? tiotropium bromide (SPIRIVA RESPIMAT) 2.5 mcg /actuation  2 Puff Inhalation DAILY   ??? acetaminophen (TYLENOL) tablet  650 mg  650 mg Oral Q6H PRN   ??? PHOSPHATE ELECTROLYTE REPLACEMENT PROTOCOL STANDARD DOSING  1 Each Other PRN   ??? MAGNESIUM ELECTROLYTE REPLACEMENT PROTOCOL STANDARD DOSING  1 Each Other PRN   ??? POTASSIUM ELECTROLYTE REPLACEMENT PROTOCOL STANDARD DOSING  1 Each Other PRN   ??? CALCIUM ELECTROLYTE REPLACEMENT PROTOCOL STANDARD DOSING  1 Each Other PRN   ??? heparin (porcine) injection 5,000 Units  5,000 Units SubCUTAneous Q8H   ??? methylPREDNISolone (PF) (SOLU-MEDROL) injection 40 mg  40 mg IntraVENous Q6H   ??? dextrose (D50) infusion 5-25 g  10-50 mL IntraVENous PRN   ??? glucagon (GLUCAGEN) injection 1 mg  1 mg IntraMUSCular PRN   ??? insulin glargine (LANTUS) injection 1-100 Units  1-100 Units SubCUTAneous QHS   ??? insulin lispro (HUMALOG) injection 1-100 Units  1-100 Units SubCUTAneous AC&HS   ??? insulin lispro (HUMALOG) injection 1-100 Units  1-100 Units SubCUTAneous PRN       ______________________________________________________________________      Lab Review:     Recent Labs     11/16/17  0630 11/15/17  0203 11/14/17  1928   WBC 12.0* 10.9 22.4*   HGB 13.0 12.2* 13.9   HCT 39.9 37.7 41.2   PLT 251 258 398     Recent Labs     11/15/17  0203 11/14/17  2042 11/14/17  1928   NA 137  --  134*   K 5.0  --  4.6   CL 104  --  99   CO2 24 26.0 26   GLU 319*  --  379*   BUN 17  --  20   CREA 1.1  --  1.3   CA 9.3  --  9.8       Recent Labs     11/14/17  2042   PH 7.327*   PCO2 46.5*   PO2 159.0*   HCO3 24.5   FIO2 40          Assessment:     Active Problems:    COPD exacerbation (HCC) (11/14/2017)  Plan:     Risk of deterioration: medium             1. Acute respiratory failure with hypoxia and hypercapnea improved. Weaning o2   2. COPD/asthma exacerbation. Continue nebs/steroids  3. Lactic acidosis due to sepsis. Monitoring trend  4. Sepsis due to pneuomnia continue antibiotics. Continue NS  5. Pneumonia The current medical regimen is effective;  continue present plan and medications.   6. Tachycardia due to bronchodilator  side effect. Improved with IV cardizem and in transition to oral cardizem DC norvasc due to tachycardia. Continue cardizem CD. Adjust as needed.   7. LLL pulmonary nodule. Stable.   8. HTN. Continue clonidine and ACE. Adding IV cardizem for now due to elevated HR  9. Continue Breo/singulair/spiriva  10. Hyperglycemia. Continue glucommander protocol  11. DVT prophylaxis with subQ heparin  12. ADA diet  13. Full code status             Total time spent with patient: 65 Minutes                  Care Plan discussed with: Patient, Family, Care Manager, Nursing Staff, Consultant/Specialist and >50% of time spent in counseling and coordination of care    Discussed:  Care Plan    Prophylaxis:  Hep SQ and SCD's    Disposition:  Home w/Family and HH PT, OT, RN           ___________________________________________________    Attending Physician: Marlene Bast, MD

## 2017-11-16 NOTE — Progress Notes (Signed)
Progress Notes by Claudette Laws, MD at 11/16/17 1329                Author: Claudette Laws, MD  Service: Pulmonary Disease  Author Type: Physician       Filed: 11/16/17 1335  Date of Service: 11/16/17 1329  Status: Addendum          Editor: Claudette Laws, MD (Physician)          Related Notes: Original Note by Claudette Laws, MD (Physician) filed at 11/16/17 1334                          [facility]           Progress Note         Name:  [name] [name]        DOB:  Oct 29, 1950     MRN:  [id number]     Date:  11/16/2017      [x]  I have reviewed the flowsheet and previous days notes. Events, vitals, medications and notes from last 24 hours reviewed.         ASSESSMENT:       -Acute hypercapnic respiratory failure requiring BiPAP   -COPD/Asthma Exacerbation: procalcitonin <0.05   -Lactic acidosis: 3.48 on admission   -Hyperglycemia   -Bilateral bullous emphysema: Chest Xray revealed patchy infiltrates with ground glass granularity consistent with emphysema and atelectasis   -Stable LLL pulmonary nodule 18 mm x 16mm   -Hypertension on Norvasc and Clonidine as an outpatient   -History of Hepatitis   -History of depression   -History of GERD             PLAN:       -Transitioned to NC Supplemental O2 this am.  Titrate to maintain SaO2 >90%.   - She was on BIPAP most of the day and night   - Tachycardia much better this am   -Continue on PRN nebs and Solu-Medrol Q6 hours   -Continue on Ceftriaxone and Azithromax for empiric coverage.     -discontinue IVF   -tolerating  Regular diet.    -Accuchecks AC/HS   -Insulin per Glucommander protocol.   -Electrolytes and labs per replacement protocol.   -Heparin SQ for DVT prophylaxis   -CBC, BMP in am.       Ok to transfer to med surg with BIPAP prn and QHS      Discussed with the nurse and RT.   CCT: 32 min                     Subjective: No new issues, feels so much better this am, smiling and joking, calmer today      ROS: no change       Allergy:     Allergies        Allergen  Reactions         ?  Chocolate [Cocoa]  Sneezing            Vital Signs:       Visit Vitals   BP  96/73      Pulse  93      Temp  98.2 ??F (36.8 ??C)      Resp  18      Ht  5\' 10"  (1.778 m)      Wt  86.2 kg (190 lb)      SpO2  94%  Breastfeeding?  No      BMI  27.26 kg/m??                O2 Device: Humidifier, Nasal cannula     O2 Flow Rate (L/min): 2 l/min     Temp (24hrs), Avg:98.8 ??F (37.1 ??C), Min:97.6 ??F (36.4 ??C), Max:99.7 ??F (37.6 ??C)           Patient Vitals for the past 8 hrs:            Temp  Pulse  Resp  BP  SpO2            11/16/17 1201  --  93  18  96/73  94 %            11/16/17 1133  98.2 ??F (36.8 ??C)  --  --  --  --     11/16/17 1103  --  (!) 103  24  --  93 %     11/16/17 1101  --  --  --  121/76  94 %     11/16/17 1008  --  (!) 116  21  140/87  98 %     11/16/17 1005  --  (!) 115  --  140/87  --     11/16/17 0902  --  86  18  100/73  97 %     11/16/17 0802  98.7 ??F (37.1 ??C)  98  20  117/88  94 %     11/16/17 0703  --  (!) 114  23  (!) 154/97  95 %            11/16/17 0600  --  99  19  (!) 153/99  99 %           Intake/Output:    Last shift:      06/06 0701 - 06/06 1900   In: 13.9 [I.V.:13.9]   Out: -    Last 3 shifts: 06/04 1901 - 06/06 0700   In: 2190.2 [P.O.:550; I.V.:1640.2]   Out: 3075 [Urine:3075]      Intake/Output Summary (Last 24 hours) at 11/16/2017 1329   Last data filed at 11/16/2017 1133     Gross per 24 hour        Intake  560.34 ml        Output  1200 ml        Net  -639.66 ml           Oxygen Settings:          Mode  Rate  Tidal Volume  Pressure  FiO2  PEEP                  28 %              Peak airway pressure:            Minute ventilation:  9.3 l/min         Physical Exam:   HEENT: NCAT, PERRLA   Neck: supple   Chest: clear but greatly diminished BS   CVS:RRR   Abd: soft, NT, ND, BS++   Ext: no edema   Neuro: non focal exam         DATA:      Current Facility-Administered Medications          Medication  Dose  Route  Frequency           ?   dilTIAZem CD (CARDIZEM CD) capsule  180 mg   180 mg  Oral  DAILY     ?  losartan (COZAAR) tablet 100 mg   100 mg  Oral  DAILY     ?  levalbuterol (XOPENEX) nebulizer soln 1.25 mg/3 mL   1.25 mg  Nebulization  Q6H RT     ?  LORazepam (ATIVAN) injection 1-2 mg   1-2 mg  IntraVENous  Q6H PRN     ?  cloNIDine HCl (CATAPRES) tablet 0.3 mg   0.3 mg  Oral  BID     ?  benzonatate (TESSALON) capsule 100 mg   100 mg  Oral  TID PRN     ?  guaiFENesin (ROBITUSSIN) 100 mg/5 mL oral liquid 100 mg   100 mg  Oral  Q4H PRN     ?  guaiFENesin-codeine (ROBITUSSIN AC) 100-10 mg/5 mL solution 5 mL   5 mL  Oral  Q4H PRN     ?  fluticasone propionate (FLONASE) 50 mcg/actuation nasal spray 2 Spray   2 Spray  Both Nostrils  DAILY     ?  traZODone (DESYREL) tablet 50 mg   50 mg  Oral  QHS PRN     ?  QUEtiapine SR (SEROquel XR) tablet 150 mg   150 mg  Oral  QHS     ?  polyethylene glycol (MIRALAX) packet 17 g   17 g  Oral  DAILY     ?  magnesium oxide (MAG-OX) tablet 400 mg   400 mg  Oral  DAILY     ?  cefTRIAXone (ROCEPHIN) 1 g in 0.9% sodium chloride (MBP/ADV) 50 mL MBP   1 g  IntraVENous  Q24H     ?  azithromycin (ZITHROMAX) 500 mg in 0.9% sodium chloride 250 mL IVPB   500 mg  IntraVENous  Q24H     ?  fluticasone-vilanterol (BREO ELLIPTA) 264mcg-25mcg/puff   1 Puff  Inhalation  DAILY     ?  montelukast (SINGULAIR) tablet 10 mg   10 mg  Oral  QHS     ?  rosuvastatin (CRESTOR) tablet 20 mg   20 mg  Oral  QHS     ?  spironolactone (ALDACTONE) tablet 100 mg   100 mg  Oral  DAILY     ?  tiotropium bromide (SPIRIVA RESPIMAT) 2.5 mcg /actuation   2 Puff  Inhalation  DAILY     ?  acetaminophen (TYLENOL) tablet 650 mg   650 mg  Oral  Q6H PRN     ?  PHOSPHATE ELECTROLYTE REPLACEMENT PROTOCOL STANDARD DOSING   1 Each  Other  PRN     ?  MAGNESIUM ELECTROLYTE REPLACEMENT PROTOCOL STANDARD DOSING   1 Each  Other  PRN     ?  POTASSIUM ELECTROLYTE REPLACEMENT PROTOCOL STANDARD DOSING   1 Each  Other  PRN           ?  CALCIUM ELECTROLYTE REPLACEMENT  PROTOCOL STANDARD DOSING   1 Each  Other  PRN           ?  heparin (porcine) injection 5,000 Units   5,000 Units  SubCUTAneous  Q8H     ?  methylPREDNISolone (PF) (SOLU-MEDROL) injection 40 mg   40 mg  IntraVENous  Q6H     ?  dextrose (D50) infusion 5-25 g   10-50 mL  IntraVENous  PRN     ?  glucagon (GLUCAGEN) injection 1 mg   1 mg  IntraMUSCular  PRN     ?  insulin glargine (LANTUS) injection 1-100 Units   1-100 Units  SubCUTAneous  QHS     ?  insulin lispro (HUMALOG) injection 1-100 Units   1-100 Units  SubCUTAneous  AC&HS           ?  insulin lispro (HUMALOG) injection 1-100 Units   1-100 Units  SubCUTAneous  PRN                    Labs:  Results:        Chemistry  Recent Labs         11/16/17   1026  11/15/17   0203  11/14/17   2042  11/14/17   1928      GLU  390*  319*   --   379*      NA  135*  137   --   134*      K  4.6  5.0   --   4.6      CL  100  104   --   99      CO2  26  24  26.0  26      BUN  18  17   --   20      CREA  1.2  1.1   --   1.3      CA  9.8  9.3   --   9.8      AGAP  10  9   --   9      AP  78   --    --    --       TP  8.5*   --    --    --       ALB  4.0   --    --    --                  CBC w/Diff  Recent Labs         11/16/17   0630  11/15/17   0203  11/14/17   1928      WBC  12.0*  10.9  22.4*      RBC  4.50  4.29  4.70      HGB  13.0  12.2*  13.9      HCT  39.9  37.7  41.2      PLT  251  258  398      GRANS  83.6*  90.9*  50.5      LYMPH  10.8*  7.6*  42.9      EOS  0.0  0.0   --                       All Micro Results               Procedure  Component  Value  Units  Date/Time           CULTURE, BLOOD [[id number]]  Collected:  11/14/17 2307            Order Status:  Completed  Specimen:  Blood  Updated:  11/16/17 0726                Blood Culture Result  No Growth At 24 Hours                CULTURE, BLOOD [  [id number]]  Collected:  11/14/17 2255            Order Status:  Completed  Specimen:  Blood  Updated:  11/16/17 0725                Blood Culture Result  No Growth At 24 Hours                 STREP PNEUMONIAE ANTIGEN, URINE [[id number]]  Collected:  11/14/17 2320            Order Status:  Completed  Specimen:  Urine  Updated:  11/15/17 0102                Strep pneumo Ag, urine  NEGATIVE                  Comment:  Presumptive negative for pneumococcal pneumonia, suggesting no current or recent pneumococcal   infection.  Infection due to S. pneumoniae cannot be ruled out since the antigen present in the   sample may be below the detection limit of the test.                        LEGIONELLA PNEUMOPHILA AG, URINE [[id number]][[id number]]  Collected:  11/14/17 2320            Order Status:  Completed  Specimen:  Urine  Updated:  11/15/17 0102                Legionella Ag, urine  NEGATIVE                  Comment:  Presumptive negative for L. pneumophila serogroup 1 antigen in urine, suggesting no recent or   current infection. Infection due to Legionella cannot be ruled out since other serogroups and   species may cause disease, antigen may not be present in urine in early infection and the level   of antigen present in the urine may be below the detection limit of the test.                        CULTURE, RESPIRATORY/SPUTUM/BRONCH [name] GRAM STAIN [[id number]][[id number]]              Order Status:  No result  Specimen:  Sputum                     Imaging:   No results found.               Claudette Lawsajnish Shaquan Missey, MD   November 16, 2017   1:29 PM
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
## 2017-11-16 NOTE — Progress Notes (Signed)
TRANSFER - OUT REPORT:    Verbal report given to [facility] RN(name) on [place]  being transferred to [facility](unit) for routine progression of care       Report consisted of patient's Situation, Background, Assessment and   Recommendations(SBAR).     Information from the following report(s) SBAR, Intake/Output, Recent Results and Cardiac Rhythm SR was reviewed with the receiving nurse.    Lines:   Peripheral IV 11/15/17 Left;Lower;Inner Forearm (Active)   Site Assessment Clean, dry, & intact 11/16/2017  7:35 AM   Phlebitis Assessment 0 11/16/2017  7:35 AM   Infiltration Assessment 0 11/16/2017  7:35 AM   Dressing Status Clean, dry, & intact 11/16/2017  7:35 AM   Dressing Type Transparent 11/16/2017  7:35 AM   Hub Color/Line Status Pink;Patent 11/16/2017  7:35 AM   Action Taken Open ports on tubing capped 11/16/2017  7:35 AM   Alcohol Cap Used Yes 11/16/2017  7:35 AM       Peripheral IV 11/15/17 Left;Upper Cephalic (Active)   Site Assessment Clean, dry, & intact 11/16/2017  7:35 AM   Phlebitis Assessment 0 11/16/2017  7:35 AM   Infiltration Assessment 0 11/16/2017  7:35 AM   Dressing Status Clean, dry, & intact 11/16/2017  7:35 AM   Dressing Type Transparent 11/16/2017  7:35 AM   Hub Color/Line Status Pink;Patent 11/16/2017  7:35 AM   Action Taken Open ports on tubing capped 11/16/2017  7:35 AM   Alcohol Cap Used Yes 11/16/2017  7:35 AM        Opportunity for questions and clarification was provided.      Patient transported with:   Monitor  Registered Nurse

## 2017-11-17 ENCOUNTER — Encounter

## 2017-11-17 LAB — CBC WITH AUTO DIFFERENTIAL
Basophils %: 0.1 % (ref 0–3)
Eosinophils %: 0 % (ref 0–5)
Hematocrit: 38.6 % (ref 37.0–50.0)
Hemoglobin: 12.5 gm/dl — ABNORMAL LOW (ref 13.0–17.2)
Immature Granulocytes: 0.7 % (ref 0.0–3.0)
Lymphocytes %: 7.7 % — ABNORMAL LOW (ref 28–48)
MCH: 28.2 pg (ref 25.4–34.6)
MCHC: 32.4 gm/dl (ref 30.0–36.0)
MCV: 87.1 fL (ref 80.0–98.0)
MPV: 11.4 fL — ABNORMAL HIGH (ref 6.0–10.0)
Monocytes %: 6.2 % (ref 1–13)
Neutrophils %: 85.3 % — ABNORMAL HIGH (ref 34–64)
Nucleated RBCs: 0 (ref 0–0)
Platelets: 235 10*3/uL (ref 140–450)
RBC: 4.43 M/uL (ref 3.60–5.20)
RDW-SD: 41.1 (ref 36.4–46.3)
WBC: 9.4 10*3/uL (ref 4.0–11.0)

## 2017-11-17 LAB — RENAL FUNCTION PANEL
Albumin: 3.5 gm/dl (ref 3.4–5.0)
Albumin: 3.5 gm/dl (ref 3.4–5.0)
Anion Gap: 10 mmol/L (ref 5–15)
Anion gap: 10 mmol/L (ref 5–15)
BUN: 30 mg/dl — ABNORMAL HIGH (ref 7–25)
BUN: 30 mg/dl — ABNORMAL HIGH (ref 7–25)
CO2: 28 mEq/L (ref 21–32)
CO2: 28 mEq/L (ref 21–32)
Calcium: 9.2 mg/dl (ref 8.5–10.1)
Calcium: 9.2 mg/dl (ref 8.5–10.1)
Chloride: 102 mEq/L (ref 98–107)
Chloride: 102 mEq/L (ref 98–107)
Creatinine: 1.2 mg/dl (ref 0.6–1.3)
Creatinine: 1.2 mg/dl (ref 0.6–1.3)
EGFR IF NonAfrican American: 48
GFR African American: 58
GFR est AA: 58
GFR est non-AA: 48
Glucose: 217 mg/dl — ABNORMAL HIGH (ref 74–106)
Glucose: 217 mg/dl — ABNORMAL HIGH (ref 74–106)
Phosphorus: 2.4 mg/dl — ABNORMAL LOW (ref 2.5–4.9)
Phosphorus: 2.4 mg/dl — ABNORMAL LOW (ref 2.5–4.9)
Potassium: 4.3 mEq/L (ref 3.5–5.1)
Potassium: 4.3 mEq/L (ref 3.5–5.1)
Sodium: 140 mEq/L (ref 136–145)
Sodium: 140 mEq/L (ref 136–145)

## 2017-11-17 LAB — POCT GLUCOSE
POC Glucose: 311 mg/dL — ABNORMAL HIGH (ref 65–105)
POC Glucose: 244 mg/dL — ABNORMAL HIGH (ref 65–105)
POC Glucose: 332 mg/dL — ABNORMAL HIGH (ref 65–105)
POC Glucose: 259 mg/dL — ABNORMAL HIGH (ref 65–105)

## 2017-11-17 LAB — POTASSIUM
Potassium: 4.6 mEq/L (ref 3.5–5.1)
Potassium: 4.6 mEq/L (ref 3.5–5.1)

## 2017-11-17 LAB — MAGNESIUM
Magnesium: 2.3 mg/dl (ref 1.6–2.6)
Magnesium: 2.3 mg/dl (ref 1.6–2.6)

## 2017-11-17 LAB — GLUCOSE, POC
Glucose (POC): 311 mg/dL — ABNORMAL HIGH (ref 65–105)
Glucose (POC): 244 mg/dL — ABNORMAL HIGH (ref 65–105)
Glucose (POC): 332 mg/dL — ABNORMAL HIGH (ref 65–105)
Glucose (POC): 259 mg/dL — ABNORMAL HIGH (ref 65–105)

## 2017-11-17 LAB — CBC WITH AUTOMATED DIFF
BASOPHILS: 0.1 % (ref 0–3)
EOSINOPHILS: 0 % (ref 0–5)
HCT: 38.6 % (ref 37.0–50.0)
HGB: 12.5 gm/dl — ABNORMAL LOW (ref 13.0–17.2)
IMMATURE GRANULOCYTES: 0.7 % (ref 0.0–3.0)
LYMPHOCYTES: 7.7 % — ABNORMAL LOW (ref 28–48)
MCH: 28.2 pg (ref 25.4–34.6)
MCHC: 32.4 gm/dl (ref 30.0–36.0)
MCV: 87.1 fL (ref 80.0–98.0)
MONOCYTES: 6.2 % (ref 1–13)
MPV: 11.4 fL — ABNORMAL HIGH (ref 6.0–10.0)
NEUTROPHILS: 85.3 % — ABNORMAL HIGH (ref 34–64)
NRBC: 0 (ref 0–0)
PLATELET: 235 10*3/uL (ref 140–450)
RBC: 4.43 M/uL (ref 3.60–5.20)
RDW-SD: 41.1 (ref 36.4–46.3)
WBC: 9.4 10*3/uL (ref 4.0–11.0)

## 2017-11-17 MED FILL — AZITHROMYCIN 500 MG IV SOLUTION: 500 mg | INTRAVENOUS | Qty: 5

## 2017-11-17 MED FILL — QUETIAPINE SR 50 MG 24 HR TAB: 50 mg | ORAL | Qty: 3

## 2017-11-17 MED FILL — LEVALBUTEROL 1.25 MG/3 ML SOLN FOR INHALATION: 1.25 mg/3 mL | RESPIRATORY_TRACT | Qty: 3

## 2017-11-17 MED FILL — HEPARIN (PORCINE) 5,000 UNIT/ML IJ SOLN: 5000 unit/mL | INTRAMUSCULAR | Qty: 1

## 2017-11-17 MED FILL — CEFTRIAXONE 1 GRAM SOLUTION FOR INJECTION: 1 gram | INTRAMUSCULAR | Qty: 1

## 2017-11-17 MED FILL — ROSUVASTATIN 20 MG TAB: 20 mg | ORAL | Qty: 1

## 2017-11-17 MED FILL — LOSARTAN 50 MG TAB: 50 mg | ORAL | Qty: 2

## 2017-11-17 MED FILL — MAGNESIUM OXIDE 400 MG TAB: 400 mg | ORAL | Qty: 1

## 2017-11-17 MED FILL — SOLU-MEDROL (PF) 40 MG/ML SOLUTION FOR INJECTION: 40 mg/mL | INTRAMUSCULAR | Qty: 1

## 2017-11-17 MED FILL — CLONIDINE 0.1 MG TAB: 0.1 mg | ORAL | Qty: 1

## 2017-11-17 MED FILL — POLYETHYLENE GLYCOL 3350 17 GRAM (100 %) ORAL POWDER PACKET: 17 gram | ORAL | Qty: 1

## 2017-11-17 MED FILL — SPIRONOLACTONE 100 MG TAB: 100 mg | ORAL | Qty: 1

## 2017-11-17 MED FILL — MONTELUKAST 10 MG TAB: 10 mg | ORAL | Qty: 1

## 2017-11-17 MED FILL — DILTIAZEM ER 180 MG 24 HR CAP: 180 mg | ORAL | Qty: 1

## 2017-11-17 NOTE — Nurse Consult (Signed)
[facility]- multi visit patient (4 admits in last 12 months, 4 admits in last 6 months)     Patient admitted into ICU on 6/4 for resp failure, then transferred to [facility]     Patient has long hx of asthma. Pleasant 67 y/o who reported she was in the [place] crisis and since then she has had asthma. She reports taking 3 inhalers- spirva, Advair and her albuterol. She states she stays In the house most of the time due to the breathing issues. She lives with her husband and states she does not mind staying in the house most of the time. She denies any problem getting medications. States she is compliant with using inhalers.    She has a Pulm- Dr Burk and she sees him every 2 months.  PCP - Dr Brock patient was seen 2 weeks ago.    [facility] scheduled PCP f/u with Dr Brock June 13 at 1pm, patient notified.    M.Gerard Cantara BSN RN  Nurse Navigator

## 2017-11-17 NOTE — Progress Notes (Signed)
Problem: Diabetes Self-Management  Goal: *Disease process and treatment process  Description  Define diabetes and identify own type of diabetes; list 3 options for treating diabetes.  Outcome: Progressing Towards Goal  Goal: *Incorporating nutritional management into lifestyle  Description  Describe effect of type, amount and timing of food on blood glucose; list 3 methods for planning meals.  Outcome: Progressing Towards Goal  Goal: *Incorporating physical activity into lifestyle  Description  State effect of exercise on blood glucose levels.  Outcome: Progressing Towards Goal  Goal: *Developing strategies to promote health/change behavior  Description  Define the ABC's of diabetes; identify appropriate screenings, schedule and personal plan for screenings.  Outcome: Progressing Towards Goal  Goal: *Using medications safely  Description  State effect of diabetes medications on diabetes; name diabetes medication taking, action and side effects.  Outcome: Progressing Towards Goal  Goal: *Monitoring blood glucose, interpreting and using results  Description  Identify recommended blood glucose targets  and personal targets.  Outcome: Progressing Towards Goal  Goal: *Prevention, detection, treatment of acute complications  Description  List symptoms of hyper- and hypoglycemia; describe how to treat low blood sugar and actions for lowering  high blood glucose level.  Outcome: Progressing Towards Goal  Goal: *Prevention, detection and treatment of chronic complications  Description  Define the natural course of diabetes and describe the relationship of blood glucose levels to long term complications of diabetes.  Outcome: Progressing Towards Goal  Goal: *Developing strategies to address psychosocial issues  Description  Describe feelings about living with diabetes; identify support needed and support network  Outcome: Progressing Towards Goal  Goal: *Insulin pump training  Outcome: Progressing Towards Goal   Goal: *Sick day guidelines  Outcome: Progressing Towards Goal  Goal: *Patient Specific Goal (EDIT GOAL, INSERT TEXT)  Outcome: Progressing Towards Goal     Problem: Patient Education: Go to Patient Education Activity  Goal: Patient/Family Education  Outcome: Progressing Towards Goal     Problem: Tissue Perfusion - Cardiopulmonary, Altered  Goal: *Absence of hypoxia  Outcome: Progressing Towards Goal     Problem: Gas Exchange - Impaired  Goal: *Absence of hypoxia  Outcome: Progressing Towards Goal     Problem: Patient Education: Go to Patient Education Activity  Goal: Patient/Family Education  Outcome: Progressing Towards Goal     Problem: Pressure Injury - Risk of  Goal: *Prevention of pressure injury  Description  Document Braden Scale and appropriate interventions in the flowsheet.  Outcome: Progressing Towards Goal     Problem: Patient Education: Go to Patient Education Activity  Goal: Patient/Family Education  Outcome: Progressing Towards Goal     Problem: Falls - Risk of  Goal: *Absence of Falls  Description  Document Bridgette HabermannSchmid Fall Risk and appropriate interventions in the flowsheet.  Outcome: Progressing Towards Goal     Problem: Patient Education: Go to Patient Education Activity  Goal: Patient/Family Education  Outcome: Progressing Towards Goal     Problem: Patient Education: Go to Patient Education Activity  Goal: Patient/Family Education  Outcome: Progressing Towards Goal

## 2017-11-17 NOTE — Progress Notes (Signed)
Problem: Gas Exchange - Impaired  Goal: *Absence of hypoxia  Outcome: Progressing Towards Goal
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
## 2017-11-17 NOTE — Progress Notes (Signed)
OCCUPATIONAL THERAPY MISSED SESSION        Patient: [name] [name] (67 y.o. female)  Room: 2210/2210    Date: 11/17/2017  Time:  1105  Primary Diagnosis: COPD exacerbation (HCC) [J44.1]       Admit date: 11/14/2017  Insurance: Payor: [employer] MEDICARE / Plan: [facility] [employer] MEDICARE / Product Type: Managed Care Medicare /      OBJECTIVE:     Orders, labs, and chart reviewed on Lahari R Crock. Discussed with Jessica, RN.    Patient was not seen for skilled occupational therapy treatment, secondary to " I do not want to get up unless I am going home."  As therapist was walking away patient asks " what do you want to do?"  Therapist explained and patient agreed to OT. When therapist stepped out of the room, nurse navigator came in to talk with patient.  Patient asked to speak with her.     PLAN:     Our services will follow up as patient's condition and/or schedule permits.    Dudley Mages, OTR/L

## 2017-11-17 NOTE — Other (Signed)
Bedside shift change report given to Jessica Handy, RN (oncoming nurse) by Shamir, RN (offgoing nurse). Report included the following information SBAR, Kardex and Cardiac Rhythm NSR.

## 2017-11-17 NOTE — Telephone Encounter (Signed)
Your patient.

## 2017-11-17 NOTE — Progress Notes (Signed)
Medical Progress Note      NAME: [name] [name]   DOB:  1950-07-20  MRM:  [id number]    Date/Time: 11/17/2017  4:16 PM     Subjective:     67 year old woman with past history of COPD/asthma HTN DM II presents with increasing SOB despite using HHN's at home. She initially required BiPAP and has been weaned now to [facility]. She has had HHNs and IV solumedrol. IV antibiotics have been started for treatment of pneumonia .she is feeling better but still SOB. She CO cough. She is resting on rounds but arouses easily and has no CO's/asking when she can go home.     Past Medical History reviewed and unchanged from Admission History and Physical    Review of Systems   Constitutional: Positive for fatigue.   HENT: Positive for congestion.    Eyes: Negative.    Respiratory: Positive for cough and shortness of breath.    Cardiovascular: Negative.    Gastrointestinal: Negative.    Endocrine: Negative.    Genitourinary: Negative.    Musculoskeletal: Negative.    Skin: Negative.    Neurological: Negative.    Hematological: Negative.    Psychiatric/Behavioral: Negative.             Objective:       Vitals:      Last 24hrs VS reviewed since prior progress note. Most recent are:    Visit Vitals  BP 110/70 (BP 1 Location: Left arm, BP Patient Position: Supine)   Pulse 72   Temp 98.2 ??F (36.8 ??C)   Resp 16   Ht 5\' 10"  (1.778 m)   Wt 88.8 kg (195 lb 12.3 oz)   SpO2 99%   Breastfeeding? No   BMI 28.09 kg/m??     SpO2 Readings from Last 6 Encounters:   11/17/17 99%   11/07/17 90%   11/02/17 91%   10/11/17 98%   09/20/17 96%   09/14/17 99%    O2 Flow Rate (L/min): 2 l/min       Intake/Output Summary (Last 24 hours) at 11/17/2017 1358  Last data filed at 11/17/2017 0719  Gross per 24 hour   Intake 300 ml   Output 1400 ml   Net -1100 ml          Exam:      Physical Exam   Nursing note and vitals reviewed.  Constitutional: She is oriented to person, place, and time. She appears well-developed and well-nourished.   HENT:   Head: Normocephalic and atraumatic.    Eyes: Pupils are equal, round, and reactive to light.   Neck: Normal range of motion.   Cardiovascular: Normal rate.   Pulmonary/Chest: She has wheezes. She has rales.   Abdominal: Soft. Bowel sounds are normal.   Musculoskeletal: Normal range of motion.   Neurological: She is alert and oriented to person, place, and time.   Skin: Skin is warm.       Telemetry reviewed:   SVT    Lab Data Reviewed: (see below)      Medications:  Current Facility-Administered Medications   Medication Dose Route Frequency   ??? dilTIAZem CD (CARDIZEM CD) capsule 180 mg  180 mg Oral DAILY   ??? losartan (COZAAR) tablet 100 mg  100 mg Oral DAILY   ??? levalbuterol (XOPENEX) nebulizer soln 1.25 mg/3 mL  1.25 mg Nebulization Q6H RT   ??? LORazepam (ATIVAN) injection 1-2 mg  1-2 mg IntraVENous Q6H PRN   ??? cloNIDine  HCl (CATAPRES) tablet 0.3 mg  0.3 mg Oral BID   ??? benzonatate (TESSALON) capsule 100 mg  100 mg Oral TID PRN   ??? guaiFENesin (ROBITUSSIN) 100 mg/5 mL oral liquid 100 mg  100 mg Oral Q4H PRN   ??? guaiFENesin-codeine (ROBITUSSIN AC) 100-10 mg/5 mL solution 5 mL  5 mL Oral Q4H PRN   ??? fluticasone propionate (FLONASE) 50 mcg/actuation nasal spray 2 Spray  2 Spray Both Nostrils DAILY   ??? traZODone (DESYREL) tablet 50 mg  50 mg Oral QHS PRN   ??? QUEtiapine SR (SEROquel XR) tablet 150 mg  150 mg Oral QHS   ??? polyethylene glycol (MIRALAX) packet 17 g  17 g Oral DAILY   ??? magnesium oxide (MAG-OX) tablet 400 mg  400 mg Oral DAILY   ??? azithromycin (ZITHROMAX) 500 mg in 0.9% sodium chloride 250 mL IVPB  500 mg IntraVENous Q24H   ??? fluticasone-vilanterol (BREO ELLIPTA) 265mcg-25mcg/puff  1 Puff Inhalation DAILY   ??? montelukast (SINGULAIR) tablet 10 mg  10 mg Oral QHS   ??? rosuvastatin (CRESTOR) tablet 20 mg  20 mg Oral QHS   ??? spironolactone (ALDACTONE) tablet 100 mg  100 mg Oral DAILY   ??? tiotropium bromide (SPIRIVA RESPIMAT) 2.5 mcg /actuation  2 Puff Inhalation DAILY   ??? acetaminophen (TYLENOL) tablet 650 mg  650 mg Oral Q6H PRN    ??? PHOSPHATE ELECTROLYTE REPLACEMENT PROTOCOL STANDARD DOSING  1 Each Other PRN   ??? MAGNESIUM ELECTROLYTE REPLACEMENT PROTOCOL STANDARD DOSING  1 Each Other PRN   ??? POTASSIUM ELECTROLYTE REPLACEMENT PROTOCOL STANDARD DOSING  1 Each Other PRN   ??? CALCIUM ELECTROLYTE REPLACEMENT PROTOCOL STANDARD DOSING  1 Each Other PRN   ??? heparin (porcine) injection 5,000 Units  5,000 Units SubCUTAneous Q8H   ??? methylPREDNISolone (PF) (SOLU-MEDROL) injection 40 mg  40 mg IntraVENous Q6H   ??? dextrose (D50) infusion 5-25 g  10-50 mL IntraVENous PRN   ??? glucagon (GLUCAGEN) injection 1 mg  1 mg IntraMUSCular PRN   ??? insulin glargine (LANTUS) injection 1-100 Units  1-100 Units SubCUTAneous QHS   ??? insulin lispro (HUMALOG) injection 1-100 Units  1-100 Units SubCUTAneous AC&HS   ??? insulin lispro (HUMALOG) injection 1-100 Units  1-100 Units SubCUTAneous PRN       ______________________________________________________________________      Lab Review:     Recent Labs     11/17/17  0246 11/16/17  0630 11/15/17  0203   WBC 9.4 12.0* 10.9   HGB 12.5* 13.0 12.2*   HCT 38.6 39.9 37.7   PLT 235 251 258     Recent Labs     11/17/17  0246 11/16/17  1026 11/16/17  0630 11/15/17  0203   NA 140 135*  --  137   K 4.3 4.6  --  5.0   CL 102 100  --  104   CO2 28 26  --  24   GLU 217* 390*  --  319*   BUN 30* 18  --  17   CREA 1.2 1.2  --  1.1   CA 9.2 9.8  --  9.3   PHOS 2.4*  --  3.6  --    ALB 3.5 4.0  --   --    SGOT  --  11*  --   --    ALT  --  18  --   --        Recent Labs     11/14/17  2042  PH 7.327*   PCO2 46.5*   PO2 159.0*   HCO3 24.5   FIO2 40          Assessment:     Active Problems:    COPD exacerbation (HCC) (11/14/2017)           Plan:     Risk of deterioration: medium             1. Acute respiratory failure with hypoxia and hypercapnea improved. Weaning o2   2. COPD/asthma exacerbation. Continue nebs/steroids. In transition from IV to PO steroids   3. Lactic acidosis due to sepsis. Monitoring trend   4. Sepsis due to pneuomnia continue antibiotics. Continue NS  5. Pneumonia The current medical regimen is effective;  continue present plan and medications. DC rocephin today and continue azithromcin  6. Tachycardia due to bronchodilator side effect. Improved with IV cardizem and in transition to oral cardizem DC norvasc due to tachycardia. Continue cardizem CD. Adjust as needed.   7. LLL pulmonary nodule. Stable.   8. HTN. Continue clonidine and ACE. Adding IV cardizem for now due to elevated HR  9. Continue Breo/singulair/spiriva  10. Hyperglycemia. Continue glucommander protocol  11. DVT prophylaxis with subQ heparin  12. ADA diet  13. Full code status             Total time spent with patient: 4745 Minutes                  Care Plan discussed with: Patient, Family, Care Manager, Nursing Staff, Consultant/Specialist and >50% of time spent in counseling and coordination of care    Discussed:  Care Plan    Prophylaxis:  Hep SQ and SCD's    Disposition:  Home w/Family and HH PT, OT, RN           ___________________________________________________    Attending Physician: Marlene BastJeffrey G Muskaan Smet, MD

## 2017-11-17 NOTE — Other (Signed)
Pt had 10 beats of non-sustained V-tach, per tele tech. Paged Dr. Fara ChuteHolladay. Potassium and magnesium levels ordered.

## 2017-11-17 NOTE — Progress Notes (Signed)
Problem: Diabetes Self-Management  Goal: *Disease process and treatment process  Description  Define diabetes and identify own type of diabetes; list 3 options for treating diabetes.  Outcome: Progressing Towards Goal  Goal: *Incorporating nutritional management into lifestyle  Description  Describe effect of type, amount and timing of food on blood glucose; list 3 methods for planning meals.  Outcome: Progressing Towards Goal  Goal: *Incorporating physical activity into lifestyle  Description  State effect of exercise on blood glucose levels.  Outcome: Progressing Towards Goal  Goal: *Developing strategies to promote health/change behavior  Description  Define the ABC's of diabetes; identify appropriate screenings, schedule and personal plan for screenings.  Outcome: Progressing Towards Goal  Goal: *Using medications safely  Description  State effect of diabetes medications on diabetes; name diabetes medication taking, action and side effects.  Outcome: Progressing Towards Goal  Goal: *Monitoring blood glucose, interpreting and using results  Description  Identify recommended blood glucose targets  and personal targets.  Outcome: Progressing Towards Goal  Goal: *Prevention, detection, treatment of acute complications  Description  List symptoms of hyper- and hypoglycemia; describe how to treat low blood sugar and actions for lowering  high blood glucose level.  Outcome: Progressing Towards Goal  Goal: *Prevention, detection and treatment of chronic complications  Description  Define the natural course of diabetes and describe the relationship of blood glucose levels to long term complications of diabetes.  Outcome: Progressing Towards Goal  Goal: *Developing strategies to address psychosocial issues  Description  Describe feelings about living with diabetes; identify support needed and support network  Outcome: Progressing Towards Goal  Goal: *Insulin pump training  Outcome: Progressing Towards Goal  Goal: *Sick  day guidelines  Outcome: Progressing Towards Goal  Goal: *Patient Specific Goal (EDIT GOAL, INSERT TEXT)  Outcome: Progressing Towards Goal     Problem: Patient Education: Go to Patient Education Activity  Goal: Patient/Family Education  Outcome: Progressing Towards Goal     Problem: Tissue Perfusion - Cardiopulmonary, Altered  Goal: *Absence of hypoxia  Outcome: Progressing Towards Goal     Problem: Gas Exchange - Impaired  Goal: *Absence of hypoxia  Outcome: Progressing Towards Goal     Problem: Patient Education: Go to Patient Education Activity  Goal: Patient/Family Education  Outcome: Progressing Towards Goal     Problem: Pressure Injury - Risk of  Goal: *Prevention of pressure injury  Description  Document Braden Scale and appropriate interventions in the flowsheet.  Outcome: Progressing Towards Goal     Problem: Patient Education: Go to Patient Education Activity  Goal: Patient/Family Education  Outcome: Progressing Towards Goal     Problem: Falls - Risk of  Goal: *Absence of Falls  Description  Document [name] Fall Risk and appropriate interventions in the flowsheet.  Outcome: Progressing Towards Goal     Problem: Patient Education: Go to Patient Education Activity  Goal: Patient/Family Education  Outcome: Progressing Towards Goal     Problem: Patient Education: Go to Patient Education Activity  Goal: Patient/Family Education  Outcome: Progressing Towards Goal

## 2017-11-17 NOTE — Progress Notes (Signed)
Progress Notes by Verlan Friends, MD at 11/17/17 1221                Author: Verlan Friends, MD  Service: Pulmonary Disease  Author Type: Physician       Filed: 11/17/17 1244  Date of Service: 11/17/17 1221  Status: Signed          Editor: Verlan Friends, MD (Physician)                  ICU Patient Progress Note           [facility]          Name:  [name] [name]        DOB:  1951/03/29     MRN:  [id number]     Date:  11/17/2017      [x]  I have reviewed the flowsheet and previous days notes. Events, vitals, medications and notes from last 24 hours reviewed. Care plan discussed  on multidisciplinary rounds.        IMPRESSION:     ??    Acute on chronic hypercapnic respiratory failure due to COPD exacerbation   ??  COPD/asthma exacerbation.   ??  Lactic acidosis-interval resolution   ??  Hyperglycemia   ??  Stable left lower lobe nodule with cavity 18 mm x 18 mm   ??  Hypertension   ??  History of hepatitis   ??  History of depression       PLAN:     ??    Continue supplemental oxygen.  Titrate to maintain oxygen saturation greater than 88%   ??  Continue BiPAP during nighttime.  And as needed basis during daytime.     ??  Continue Solu-Medrol IV every 6 hours.  If remains stable switch to p.o. prednisone from tomorrow   ??  Continue Breo 200/25 1 inhalation a day and Spiriva 2.5 2 inhalation a day.use Xopenex as needed basis.   ??  Consider follow-up for left lower lobe nodule that seems reasonably stable though compared to previous CT chest performed in 2017 there been slight interval increase in size   ??  Discontinue Rocephin.  Continue azithromycin for COPD exacerbation   ??  Continue subcu heparin for DVT prophylaxis                        Allergy:     Allergies        Allergen  Reactions         ?  Chocolate [Cocoa]  Sneezing                Current Facility-Administered Medications             Medication  Dose  Route  Frequency  Provider  Last Rate  Last Dose              ?   dilTIAZem CD (CARDIZEM CD) capsule 180 mg   180 mg  Oral  DAILY  Holladay, Versie Starks, MD     180 mg at 11/17/17 0916     ?  losartan (COZAAR) tablet 100 mg   100 mg  Oral  DAILY  Holladay, Versie Starks, MD     100 mg at 11/17/17 2130     ?  levalbuterol (XOPENEX) nebulizer soln 1.25 mg/3 mL   1.25 mg  Nebulization  Q6H RT  Dhawan, Rajnish, MD     1.25  mg at 11/17/17 0854     ?  LORazepam (ATIVAN) injection 1-2 mg   1-2 mg  IntraVENous  Q6H PRN  Holladay, Versie StarksJeffrey G, MD     2 mg at 11/15/17 1534     ?  cloNIDine HCl (CATAPRES) tablet 0.3 mg   0.3 mg  Oral  BID  Holladay, Versie StarksJeffrey G, MD     0.3 mg at 11/17/17 0916     ?  benzonatate (TESSALON) capsule 100 mg   100 mg  Oral  TID PRN  Holladay, Versie StarksJeffrey G, MD     100 mg at 11/15/17 1736     ?  guaiFENesin (ROBITUSSIN) 100 mg/5 mL oral liquid 100 mg   100 mg  Oral  Q4H PRN  Holladay, Versie StarksJeffrey G, MD     100 mg at 11/15/17 1736     ?  guaiFENesin-codeine (ROBITUSSIN AC) 100-10 mg/5 mL solution 5 mL   5 mL  Oral  Q4H PRN  Holladay, Versie StarksJeffrey G, MD           ?  fluticasone propionate (FLONASE) 50 mcg/actuation nasal spray 2 Spray   2 Spray  Both Nostrils  DAILY  Holladay, Versie StarksJeffrey G, MD     2 Spray at 11/17/17 [phone]     ?  traZODone (DESYREL) tablet 50 mg   50 mg  Oral  QHS PRN  Holladay, Versie StarksJeffrey G, MD     50 mg at 11/15/17 2101     ?  QUEtiapine SR (SEROquel XR) tablet 150 mg   150 mg  Oral  QHS  Holladay, Versie StarksJeffrey G, MD     150 mg at 11/16/17 2135     ?  polyethylene glycol (MIRALAX) packet 17 g   17 g  Oral  DAILY  Holladay, Versie StarksJeffrey G, MD     17 g at 11/17/17 0919     ?  magnesium oxide (MAG-OX) tablet 400 mg   400 mg  Oral  DAILY  Holladay, Versie StarksJeffrey G, MD     400 mg at 11/17/17 0916     ?  cefTRIAXone (ROCEPHIN) 1 g in 0.9% sodium chloride (MBP/ADV) 50 mL MBP   1 g  IntraVENous  Q24H  Holladay, Versie StarksJeffrey G, MD     Stopped at 11/16/17 2100              ?  azithromycin (ZITHROMAX) 500 mg in 0.9% sodium chloride 250 mL IVPB   500 mg  IntraVENous  Q24H  Holladay, Versie StarksJeffrey G, MD     Stopped  at 11/17/17 0000              ?  fluticasone-vilanterol (BREO ELLIPTA) 26500mcg-25mcg/puff   1 Puff  Inhalation  DAILY  Holladay, Versie StarksJeffrey G, MD     1 Puff at 11/17/17 [phone]     ?  montelukast (SINGULAIR) tablet 10 mg   10 mg  Oral  QHS  Holladay, Versie StarksJeffrey G, MD     10 mg at 11/16/17 2134     ?  rosuvastatin (CRESTOR) tablet 20 mg   20 mg  Oral  QHS  Holladay, Versie StarksJeffrey G, MD     20 mg at 11/16/17 2135     ?  spironolactone (ALDACTONE) tablet 100 mg   100 mg  Oral  DAILY  Holladay, Versie StarksJeffrey G, MD     100 mg at 11/17/17 [id number]     ?  tiotropium bromide (SPIRIVA RESPIMAT) 2.5 mcg /actuation   2 Puff  Inhalation  DAILY  Holladay, Versie Starks, MD     2 Puff at 11/17/17 1610     ?  acetaminophen (TYLENOL) tablet 650 mg   650 mg  Oral  Q6H PRN  Holladay, Versie Starks, MD     650 mg at 11/15/17 0015     ?  PHOSPHATE ELECTROLYTE REPLACEMENT PROTOCOL STANDARD DOSING   1 Each  Other  PRN  Holladay, Versie Starks, MD           ?  MAGNESIUM ELECTROLYTE REPLACEMENT PROTOCOL STANDARD DOSING   1 Each  Other  PRN  Holladay, Versie Starks, MD           ?  POTASSIUM ELECTROLYTE REPLACEMENT PROTOCOL STANDARD DOSING   1 Each  Other  PRN  Holladay, Versie Starks, MD           ?  CALCIUM ELECTROLYTE REPLACEMENT PROTOCOL STANDARD DOSING   1 Each  Other  PRN  Holladay, Versie Starks, MD           ?  heparin (porcine) injection 5,000 Units   5,000 Units  SubCUTAneous  Q8H  Holladay, Versie Starks, MD     5,000 Units at 11/17/17 0551     ?  methylPREDNISolone (PF) (SOLU-MEDROL) injection 40 mg   40 mg  IntraVENous  Q6H  Holladay, Versie Starks, MD     40 mg at 11/17/17 0551              ?  dextrose (D50) infusion 5-25 g   10-50 mL  IntraVENous  PRN  Holladay, Versie Starks, MD                    ?  glucagon (GLUCAGEN) injection 1 mg   1 mg  IntraMUSCular  PRN  Holladay, Versie Starks, MD           ?  insulin glargine (LANTUS) injection 1-100 Units   1-100 Units  SubCUTAneous  QHS  Holladay, Versie Starks, MD     51 Units at 11/16/17 2136     ?  insulin lispro (HUMALOG) injection 1-100 Units    1-100 Units  SubCUTAneous  AC&HS  Holladay, Versie Starks, MD     19 Units at 11/17/17 1008              ?  insulin lispro (HUMALOG) injection 1-100 Units   1-100 Units  SubCUTAneous  PRN  Holladay, Versie Starks, MD     10 Units at 11/16/17 2139            Past Medical History:     Past Medical History:        Diagnosis  Date         ?  Arthritis       ?  Chronic pain syndrome            related to R hip replacement         ?  COPD (chronic obstructive pulmonary disease) (HCC)       ?  DM2 (diabetes mellitus, type 2) (HCC)       ?  GERD (gastroesophageal reflux disease)       ?  Hepatitis C            HEP C         ?  HTN (hypertension)       ?  Lung nodule, multiple            (CT 10/2016)  Small left upper lobe and 1.7 x 1.6 cm left lower lobe nodules not significantly changed from 07/23/2016 and 03/22/2016         ?  Marijuana use       ?  PTSD (post-traumatic stress disorder)            lived through [place]         ?  Thoracic ascending aortic aneurysm (HCC)            4.4cm noted on CT Chest (10/2016)         ?  Thyroid nodule            2.5 cm stable right thyroid nodule (CT 10/2016)            Review of Systems    Constitutional: Negative.     HENT: Negative.     Eyes: Negative.     Respiratory: Negative.     Cardiovascular: Negative.     Gastrointestinal: Negative.     Genitourinary: Negative.     Skin: Negative.     Neurological: Negative.     Endo/Heme/Allergies: Negative.     Psychiatric/Behavioral: Negative.           Vital Signs:       Visit Vitals   BP  110/70 (BP 1 Location: Left arm, BP Patient Position: Supine)      Pulse  72      Temp  98.2 ??F (36.8 ??C)      Resp  16      Ht  5\' 10"  (1.778 m)      Wt  88.8 kg (195 lb 12.3 oz)      SpO2  95%      Breastfeeding?  No      BMI  28.09 kg/m??                O2 Device: Room air     O2 Flow Rate (L/min): 2 l/min     Temp (24hrs), Avg:98.1 ??F (36.7 ??C), Min:97.3 ??F (36.3 ??C), Max:98.6 ??F (37 ??C)           Intake/Output:    Last shift:       06/07 0701 - 06/07 1900   In: -    Out: 700 [Urine:700]   Last 3 shifts: 06/05 1901 - 06/07 0700   In: 460.3 [I.V.:460.3]   Out: 1550 [Urine:1550]      Intake/Output Summary (Last 24 hours) at 11/17/2017 1221   Last data filed at 11/17/2017 0719     Gross per 24 hour        Intake  300 ml        Output  1400 ml        Net  -1100 ml           Ventilator Settings :           Ventilator Volumes   Vt Spont (ml): 565 ml   Ve Observed (l/min): 12.1 l/min             Physical Exam    Constitutional: She is oriented to person, place, and time. She appears well-developed and well-nourished.    HENT:    Head: Normocephalic and atraumatic.    Eyes: EOM are normal.    Cardiovascular: Regular rhythm.    Pulmonary/Chest:   Reduced air entry on both side     Abdominal: Soft.    Musculoskeletal: She exhibits  no edema.   Neurological: She is alert and oriented to person, place, and time.    Skin: Skin is warm and dry.   Psychiatric: She has a normal mood and affect.    Nursing note and vitals reviewed.         DATA:      Current Facility-Administered Medications          Medication  Dose  Route  Frequency           ?  dilTIAZem CD (CARDIZEM CD) capsule 180 mg   180 mg  Oral  DAILY     ?  losartan (COZAAR) tablet 100 mg   100 mg  Oral  DAILY     ?  levalbuterol (XOPENEX) nebulizer soln 1.25 mg/3 mL   1.25 mg  Nebulization  Q6H RT     ?  LORazepam (ATIVAN) injection 1-2 mg   1-2 mg  IntraVENous  Q6H PRN     ?  cloNIDine HCl (CATAPRES) tablet 0.3 mg   0.3 mg  Oral  BID     ?  benzonatate (TESSALON) capsule 100 mg   100 mg  Oral  TID PRN     ?  guaiFENesin (ROBITUSSIN) 100 mg/5 mL oral liquid 100 mg   100 mg  Oral  Q4H PRN     ?  guaiFENesin-codeine (ROBITUSSIN AC) 100-10 mg/5 mL solution 5 mL   5 mL  Oral  Q4H PRN     ?  fluticasone propionate (FLONASE) 50 mcg/actuation nasal spray 2 Spray   2 Spray  Both Nostrils  DAILY     ?  traZODone (DESYREL) tablet 50 mg   50 mg  Oral  QHS PRN     ?  QUEtiapine SR (SEROquel XR) tablet 150 mg   150  mg  Oral  QHS     ?  polyethylene glycol (MIRALAX) packet 17 g   17 g  Oral  DAILY     ?  magnesium oxide (MAG-OX) tablet 400 mg   400 mg  Oral  DAILY     ?  cefTRIAXone (ROCEPHIN) 1 g in 0.9% sodium chloride (MBP/ADV) 50 mL MBP   1 g  IntraVENous  Q24H           ?  azithromycin (ZITHROMAX) 500 mg in 0.9% sodium chloride 250 mL IVPB   500 mg  IntraVENous  Q24H           ?  fluticasone-vilanterol (BREO ELLIPTA) 217mcg-25mcg/puff   1 Puff  Inhalation  DAILY     ?  montelukast (SINGULAIR) tablet 10 mg   10 mg  Oral  QHS     ?  rosuvastatin (CRESTOR) tablet 20 mg   20 mg  Oral  QHS     ?  spironolactone (ALDACTONE) tablet 100 mg   100 mg  Oral  DAILY     ?  tiotropium bromide (SPIRIVA RESPIMAT) 2.5 mcg /actuation   2 Puff  Inhalation  DAILY     ?  acetaminophen (TYLENOL) tablet 650 mg   650 mg  Oral  Q6H PRN     ?  PHOSPHATE ELECTROLYTE REPLACEMENT PROTOCOL STANDARD DOSING   1 Each  Other  PRN     ?  MAGNESIUM ELECTROLYTE REPLACEMENT PROTOCOL STANDARD DOSING   1 Each  Other  PRN     ?  POTASSIUM ELECTROLYTE REPLACEMENT PROTOCOL STANDARD DOSING   1 Each  Other  PRN     ?  CALCIUM ELECTROLYTE REPLACEMENT PROTOCOL STANDARD DOSING   1 Each  Other  PRN     ?  heparin (porcine) injection 5,000 Units   5,000 Units  SubCUTAneous  Q8H     ?  methylPREDNISolone (PF) (SOLU-MEDROL) injection 40 mg   40 mg  IntraVENous  Q6H     ?  dextrose (D50) infusion 5-25 g   10-50 mL  IntraVENous  PRN     ?  glucagon (GLUCAGEN) injection 1 mg   1 mg  IntraMUSCular  PRN     ?  insulin glargine (LANTUS) injection 1-100 Units   1-100 Units  SubCUTAneous  QHS     ?  insulin lispro (HUMALOG) injection 1-100 Units   1-100 Units  SubCUTAneous  AC&HS           ?  insulin lispro (HUMALOG) injection 1-100 Units   1-100 Units  SubCUTAneous  PRN                Telemetry:  []  Sinus  []  A-flutter  [] Paced           []  A-fib  []  Multiple PVCs                     Labs:     Recent Results (from the past 24 hour(s))     GLUCOSE, POC          Collection Time:  11/16/17  1:45 PM         Result  Value  Ref Range            Glucose (POC)  291 (H)  65 - 105 mg/dL       GLUCOSE, POC          Collection Time: 11/16/17  5:51 PM         Result  Value  Ref Range            Glucose (POC)  250 (H)  65 - 105 mg/dL       GLUCOSE, POC          Collection Time: 11/16/17  9:20 PM         Result  Value  Ref Range            Glucose (POC)  311 (H)  65 - 105 mg/dL       CBC WITH AUTOMATED DIFF          Collection Time: 11/17/17  2:46 AM         Result  Value  Ref Range            WBC  9.4  4.0 - 11.0 1000/mm3       RBC  4.43  3.60 - 5.20 M/uL       HGB  12.5 (L)  13.0 - 17.2 gm/dl       HCT  29.5  62.1 - 50.0 %       MCV  87.1  80.0 - 98.0 fL       MCH  28.2  25.4 - 34.6 pg       MCHC  32.4  30.0 - 36.0 gm/dl       PLATELET  308  657 - 450 1000/mm3       MPV  11.4 (H)  6.0 - 10.0 fL       RDW-SD  41.1  36.4 - 46.3         NRBC  0  0 -  0         IMMATURE GRANULOCYTES  0.7  0.0 - 3.0 %       NEUTROPHILS  85.3 (H)  34 - 64 %       LYMPHOCYTES  7.7 (L)  28 - 48 %       MONOCYTES  6.2  1 - 13 %       EOSINOPHILS  0.0  0 - 5 %       BASOPHILS  0.1  0 - 3 %       RENAL FUNCTION PANEL          Collection Time: 11/17/17  2:46 AM         Result  Value  Ref Range            Sodium  140  136 - 145 mEq/L       Potassium  4.3  3.5 - 5.1 mEq/L       Chloride  102  98 - 107 mEq/L       CO2  28  21 - 32 mEq/L       Glucose  217 (H)  74 - 106 mg/dl       BUN  30 (H)  7 - 25 mg/dl       Creatinine  1.2  0.6 - 1.3 mg/dl       GFR est AA  16.1          GFR est non-AA  48          Calcium  9.2  8.5 - 10.1 mg/dl       Albumin  3.5  3.4 - 5.0 gm/dl       Phosphorus  2.4 (L)  2.5 - 4.9 mg/dl       Anion gap  10  5 - 15 mmol/L       GLUCOSE, POC          Collection Time: 11/17/17  8:54 AM         Result  Value  Ref Range            Glucose (POC)  244 (H)  65 - 105 mg/dL              No results for input(s): FIO2I, IFO2, HCO3I, IHCO3, HCOPOC, PCO2I, PCOPOC, IPHI, PHI, PHPOC, PO2I, PO2POC in the last 72 hours.      No  lab exists for component: IPOC2      Imaging:   []  I have personally reviewed the patients chest radiographs images and report  with the patient     Results from Hospital Encounter encounter on 11/14/17     XR CHEST SNGL V           Narrative  Indication: dyspnea.    .              Impression  IMPRESSION: Portable AP upright view the chest exposed at 8:13 PM November 14, 2017   reveals patchy infiltrates at the lung bases with groundglass granularity   consistent with emphysema and atelectasis similar to September 13, 2017. The heart is   of normal size.                Results from Hospital Encounter encounter on 10/08/17     CTA CHEST W OR W WO CONT           Narrative  DICOM format image data is available to non-affiliated external healthcare  facilities or entities on a secure, media free, reciprocally searchable basis   with patient authorization for 12 months following the date of the study.      Clinical history: Chest pain      EXAMINATION:   CTA chest with contrast. 3 mm spiral scanning is performed from the lung apices   to the upper poles of the kidneys. Coronal, sagittal and 3-D MIP sequences have   been obtained.      Correlation: 08/06/2017      FINDINGS:   There are degenerative changes of the spine. Trachea, right and left mainstem   bronchi are patent. There are bilateral axillary and compressive atelectasis.   1.8 x 1.8 cm well-circumscribed cavitary lesion left lower lobe not   significantly changed from 08/06/2017 and 11/03/2016 (increased in size from   07/03/2015, 1.3 x 1.3 cm).      2.3 cm right thyroid nodule, not significantly changed. The esophagus is not   dilated. Ascending aorta measures 4.4 cm. No lymph node enlargement in the   axilla, mediastinum or hila. No pulmonary embolism.      Visualized portions of the liver, gallbladder, pancreas and adrenal glands are   unremarkable. 13 mm indeterminate splenic lesion.              Impression  IMPRESSION:   1. No pulmonary embolism.   2. Bilateral large  bullae with compressive atelectasis.   3. 18 x 18 mm left lower lobe cavitary nodule (not significantly changed from   prior study although increased in size from 07/03/2015 - 1.3 x 1.3 cm).   4. 2.3 cm right thyroid nodule.   5. 1.3 cm indeterminate splenic lesion.      Initial interpretation: [facility].               CT Results (most recent):     Results from Hospital Encounter encounter on 10/08/17     CTA CHEST W OR W WO CONT           Narrative  DICOM format image data is available to non-affiliated external healthcare   facilities or entities on a secure, media free, reciprocally searchable basis   with patient authorization for 12 months following the date of the study.      Clinical history: Chest pain      EXAMINATION:   CTA chest with contrast. 3 mm spiral scanning is performed from the lung apices   to the upper poles of the kidneys. Coronal, sagittal and 3-D MIP sequences have   been obtained.      Correlation: 08/06/2017      FINDINGS:   There are degenerative changes of the spine. Trachea, right and left mainstem   bronchi are patent. There are bilateral axillary and compressive atelectasis.   1.8 x 1.8 cm well-circumscribed cavitary lesion left lower lobe not   significantly changed from 08/06/2017 and 11/03/2016 (increased in size from   07/03/2015, 1.3 x 1.3 cm).      2.3 cm right thyroid nodule, not significantly changed. The esophagus is not   dilated. Ascending aorta measures 4.4 cm. No lymph node enlargement in the   axilla, mediastinum or hila. No pulmonary embolism.      Visualized portions of the liver, gallbladder, pancreas and adrenal glands are   unremarkable. 13 mm indeterminate splenic lesion.              Impression  IMPRESSION:   1. No pulmonary embolism.   2. Bilateral  large bullae with compressive atelectasis.   3. 18 x 18 mm left lower lobe cavitary nodule (not significantly changed from   prior study although increased in size from 07/03/2015 - 1.3 x 1.3 cm).   4. 2.3 cm right  thyroid nodule.   5. 1.3 cm indeterminate splenic lesion.      Initial interpretation: [facility] [employer].             All Micro Results               Procedure  Component  Value  Units  Date/Time           CULTURE, BLOOD [[id number]]  Collected:  11/14/17 2307            Order Status:  Completed  Specimen:  Blood  Updated:  11/17/17 0704                Blood Culture Result  No Growth At 48 Hours                CULTURE, BLOOD [[id number]]  Collected:  11/14/17 2255            Order Status:  Completed  Specimen:  Blood  Updated:  11/17/17 0703                Blood Culture Result  No Growth At 48 Hours                STREP PNEUMONIAE ANTIGEN, URINE [[id number]]  Collected:  11/14/17 2320            Order Status:  Completed  Specimen:  Urine  Updated:  11/15/17 0102                Strep pneumo Ag, urine  NEGATIVE                  Comment:  Presumptive negative for pneumococcal pneumonia, suggesting no current or recent pneumococcal   infection.  Infection due to S. pneumoniae cannot be ruled out since the antigen present in the   sample may be below the detection limit of the test.                        LEGIONELLA PNEUMOPHILA AG, URINE [[id number]]  Collected:  11/14/17 2320            Order Status:  Completed  Specimen:  Urine  Updated:  11/15/17 0102                Legionella Ag, urine  NEGATIVE                  Comment:  Presumptive negative for L. pneumophila serogroup 1 antigen in urine, suggesting no recent or   current infection. Infection due to Legionella cannot be ruled out since other serogroups and   species may cause disease, antigen may not be present in urine in early infection and the level   of antigen present in the urine may be below the detection limit of the test.                        CULTURE, RESPIRATORY/SPUTUM/BRONCH Berniece Andreas [[id number]]              Order Status:  No result  Specimen:  Sputum                  []  See  my orders for details      My assessment, plan  of care, findings,  medications, side effects etc were discussed with:      [x]  nursing  []  PT/OT      [x]  respiratory therapy  []  Dr.     []  family  []               Verlan Friends, MD

## 2017-11-17 NOTE — Care Coordination-Inpatient (Signed)
[facility]- multi visit patient (4 admits in last 12 months, 4 admits in last 6 months)     Patient admitted into ICU on 6/4 for resp failure, then transferred to [facility]     Patient has long hx of asthma. Pleasant 67 y/o who [facility] and since then she has had asthma. She reports taking 3 inhalers- spirva, Advair and her albuterol. She states she stays In the house most of the time due to the breathing issues. She lives with her husband and states she does not mind staying in the house most of the time. She denies any problem getting medications. States she is compliant with using inhalers.    She has a Pulm- Dr Charlestine MassedBurk and she sees him every 2 months.  PCP - Dr Mal AmabileBrock patient was seen 2 weeks ago.    [facility] scheduled PCP f/u with Dr Mal AmabileBrock June 13 at 1pm, patient notified.    M.Lynch BSN Animal nutritionistN  Nurse Navigator

## 2017-11-17 NOTE — Progress Notes (Signed)
Medical Progress Note      NAME: [name] [name]   DOB:  [birth year]  MRM:  [id number]    Date/Time: 11/17/2017  4:16 PM         Subjective:     67 year old woman with past history of COPD/asthma HTN DM II presents with increasing SOB despite using HHN's at home. She initially required BiPAP and has been weaned now to [facility] She has had HHNs and IV solumedrol. IV antibiotics have been started for treatment of pneumonia .she is feeling better but still SOB. She CO cough. She is resting on rounds but arouses easily and has no CO's/asking when she can go home.     Past Medical History reviewed and unchanged from Admission History and Physical    Review of Systems   Constitutional: Positive for fatigue.   HENT: Positive for congestion.    Eyes: Negative.    Respiratory: Positive for cough and shortness of breath.    Cardiovascular: Negative.    Gastrointestinal: Negative.    Endocrine: Negative.    Genitourinary: Negative.    Musculoskeletal: Negative.    Skin: Negative.    Neurological: Negative.    Hematological: Negative.    Psychiatric/Behavioral: Negative.             Objective:       Vitals:      Last 24hrs VS reviewed since prior progress note. Most recent are:    Visit Vitals  BP 110/70 (BP 1 Location: Left arm, BP Patient Position: Supine)   Pulse 72   Temp 98.2 ??F (36.8 ??C)   Resp 16   Ht 5\' 10"  (1.778 m)   Wt 88.8 kg (195 lb 12.3 oz)   SpO2 99%   Breastfeeding? No   BMI 28.09 kg/m??     SpO2 Readings from Last 6 Encounters:   11/17/17 99%   11/07/17 90%   11/02/17 91%   10/11/17 98%   09/20/17 96%   09/14/17 99%    O2 Flow Rate (L/min): 2 l/min       Intake/Output Summary (Last 24 hours) at 11/17/2017 1358  Last data filed at 11/17/2017 0719  Gross per 24 hour   Intake 300 ml   Output 1400 ml   Net -1100 ml          Exam:      Physical Exam   Nursing note and vitals reviewed.  Constitutional: She is oriented to person, place, and time. She appears well-developed and well-nourished.   HENT:   Head: Normocephalic and atraumatic.    Eyes: Pupils are equal, round, and reactive to light.   Neck: Normal range of motion.   Cardiovascular: Normal rate.   Pulmonary/Chest: She has wheezes. She has rales.   Abdominal: Soft. Bowel sounds are normal.   Musculoskeletal: Normal range of motion.   Neurological: She is alert and oriented to person, place, and time.   Skin: Skin is warm.       Telemetry reviewed:   SVT    Lab Data Reviewed: (see below)      Medications:  Current Facility-Administered Medications   Medication Dose Route Frequency   ??? dilTIAZem CD (CARDIZEM CD) capsule 180 mg  180 mg Oral DAILY   ??? losartan (COZAAR) tablet 100 mg  100 mg Oral DAILY   ??? levalbuterol (XOPENEX) nebulizer soln 1.25 mg/3 mL  1.25 mg Nebulization Q6H RT   ??? LORazepam (ATIVAN) injection 1-2 mg  1-2 mg IntraVENous Q6H PRN   ???  cloNIDine HCl (CATAPRES) tablet 0.3 mg  0.3 mg Oral BID   ??? benzonatate (TESSALON) capsule 100 mg  100 mg Oral TID PRN   ??? guaiFENesin (ROBITUSSIN) 100 mg/5 mL oral liquid 100 mg  100 mg Oral Q4H PRN   ??? guaiFENesin-codeine (ROBITUSSIN AC) 100-10 mg/5 mL solution 5 mL  5 mL Oral Q4H PRN   ??? fluticasone propionate (FLONASE) 50 mcg/actuation nasal spray 2 Spray  2 Spray Both Nostrils DAILY   ??? traZODone (DESYREL) tablet 50 mg  50 mg Oral QHS PRN   ??? QUEtiapine SR (SEROquel XR) tablet 150 mg  150 mg Oral QHS   ??? polyethylene glycol (MIRALAX) packet 17 g  17 g Oral DAILY   ??? magnesium oxide (MAG-OX) tablet 400 mg  400 mg Oral DAILY   ??? azithromycin (ZITHROMAX) 500 mg in 0.9% sodium chloride 250 mL IVPB  500 mg IntraVENous Q24H   ??? fluticasone-vilanterol (BREO ELLIPTA) 210mcg-25mcg/puff  1 Puff Inhalation DAILY   ??? montelukast (SINGULAIR) tablet 10 mg  10 mg Oral QHS   ??? rosuvastatin (CRESTOR) tablet 20 mg  20 mg Oral QHS   ??? spironolactone (ALDACTONE) tablet 100 mg  100 mg Oral DAILY   ??? tiotropium bromide (SPIRIVA RESPIMAT) 2.5 mcg /actuation  2 Puff Inhalation DAILY   ??? acetaminophen (TYLENOL) tablet 650 mg  650 mg Oral Q6H PRN   ??? PHOSPHATE  ELECTROLYTE REPLACEMENT PROTOCOL STANDARD DOSING  1 Each Other PRN   ??? MAGNESIUM ELECTROLYTE REPLACEMENT PROTOCOL STANDARD DOSING  1 Each Other PRN   ??? POTASSIUM ELECTROLYTE REPLACEMENT PROTOCOL STANDARD DOSING  1 Each Other PRN   ??? CALCIUM ELECTROLYTE REPLACEMENT PROTOCOL STANDARD DOSING  1 Each Other PRN   ??? heparin (porcine) injection 5,000 Units  5,000 Units SubCUTAneous Q8H   ??? methylPREDNISolone (PF) (SOLU-MEDROL) injection 40 mg  40 mg IntraVENous Q6H   ??? dextrose (D50) infusion 5-25 g  10-50 mL IntraVENous PRN   ??? glucagon (GLUCAGEN) injection 1 mg  1 mg IntraMUSCular PRN   ??? insulin glargine (LANTUS) injection 1-100 Units  1-100 Units SubCUTAneous QHS   ??? insulin lispro (HUMALOG) injection 1-100 Units  1-100 Units SubCUTAneous AC&HS   ??? insulin lispro (HUMALOG) injection 1-100 Units  1-100 Units SubCUTAneous PRN       ______________________________________________________________________      Lab Review:     Recent Labs     11/17/17  0246 11/16/17  0630 11/15/17  0203   WBC 9.4 12.0* 10.9   HGB 12.5* 13.0 12.2*   HCT 38.6 39.9 37.7   PLT 235 251 258     Recent Labs     11/17/17  0246 11/16/17  1026 11/16/17  0630 11/15/17  0203   NA 140 135*  --  137   K 4.3 4.6  --  5.0   CL 102 100  --  104   CO2 28 26  --  24   GLU 217* 390*  --  319*   BUN 30* 18  --  17   CREA 1.2 1.2  --  1.1   CA 9.2 9.8  --  9.3   PHOS 2.4*  --  3.6  --    ALB 3.5 4.0  --   --    SGOT  --  11*  --   --    ALT  --  18  --   --        Recent Labs     11/14/17  2042   PH 7.327*   PCO2 46.5*   PO2 159.0*   HCO3 24.5   FIO2 40          Assessment:     Active Problems:    COPD exacerbation (HCC) (11/14/2017)           Plan:     Risk of deterioration: medium             1. Acute respiratory failure with hypoxia and hypercapnea improved. Weaning o2   2. COPD/asthma exacerbation. Continue nebs/steroids. In transition from IV to PO steroids   3. Lactic acidosis due to sepsis. Monitoring trend  4. Sepsis due to pneuomnia continue antibiotics.  Continue NS  5. Pneumonia The current medical regimen is effective;  continue present plan and medications. DC rocephin today and continue azithromcin  6. Tachycardia due to bronchodilator side effect. Improved with IV cardizem and in transition to oral cardizem DC norvasc due to tachycardia. Continue cardizem CD. Adjust as needed.   7. LLL pulmonary nodule. Stable.   8. HTN. Continue clonidine and ACE. Adding IV cardizem for now due to elevated HR  9. Continue Breo/singulair/spiriva  10. Hyperglycemia. Continue glucommander protocol  11. DVT prophylaxis with subQ heparin  12. ADA diet  13. Full code status             Total time spent with patient: 2645 Minutes                  Care Plan discussed with: Patient, Family, Care Manager, Nursing Staff, Consultant/Specialist and >50% of time spent in counseling and coordination of care    Discussed:  Care Plan    Prophylaxis:  Hep SQ and SCD's    Disposition:  Home w/Family and HH PT, OT, RN           ___________________________________________________    Attending Physician: Marlene BastJeffrey G Jaxen Samples, MD

## 2017-11-17 NOTE — Progress Notes (Signed)
 OCCUPATIONAL THERAPY MISSED SESSION        Patient: [name] [name] (67 y.o. female)  Room: 2210/2210    Date: 11/17/2017  Time:  1105  Primary Diagnosis: COPD exacerbation (HCC) [J44.1]       Admit date: 11/14/2017  Insurance: Payor: HU[employer] MEDICARE / Plan: [facility] HU[employer] MEDICARE / Product Type: Managed Care Medicare /      OBJECTIVE:     Orders, labs, and chart reviewed on Karrine R Wadel. Discussed with Harlene, RN.    Patient was not seen for skilled occupational therapy treatment, secondary to  I do not want to get up unless I am going home.  As therapist was walking away patient asks  what do you want to do?  Therapist explained and patient agreed to OT. When therapist stepped out of the room, nurse navigator came in to talk with patient.  Patient asked to speak with her.     PLAN:     Our services will follow up as patient's condition and/or schedule permits.    Suzen Hong, OTR/L

## 2017-11-18 ENCOUNTER — Encounter: Primary: Physician Assistant

## 2017-11-18 LAB — CBC WITH AUTO DIFFERENTIAL
Basophils %: 0.1 % (ref 0–3)
Eosinophils %: 0 % (ref 0–5)
Hematocrit: 36.8 % — ABNORMAL LOW (ref 37.0–50.0)
Hemoglobin: 12 gm/dl — ABNORMAL LOW (ref 13.0–17.2)
Immature Granulocytes: 2 % (ref 0.0–3.0)
Lymphocytes %: 8.4 % — ABNORMAL LOW (ref 28–48)
MCH: 28.3 pg (ref 25.4–34.6)
MCHC: 32.6 gm/dl (ref 30.0–36.0)
MCV: 86.8 fL (ref 80.0–98.0)
MPV: 11.1 fL — ABNORMAL HIGH (ref 6.0–10.0)
Monocytes %: 7.2 % (ref 1–13)
Neutrophils %: 82.3 % — ABNORMAL HIGH (ref 34–64)
Nucleated RBCs: 0 (ref 0–0)
Platelets: 250 10*3/uL (ref 140–450)
RBC: 4.24 M/uL (ref 3.60–5.20)
RDW-SD: 40.9 (ref 36.4–46.3)
WBC: 9 10*3/uL (ref 4.0–11.0)

## 2017-11-18 LAB — RENAL FUNCTION PANEL
Albumin: 3.2 gm/dl — ABNORMAL LOW (ref 3.4–5.0)
Albumin: 3.2 gm/dl — ABNORMAL LOW (ref 3.4–5.0)
Anion Gap: 7 mmol/L (ref 5–15)
Anion gap: 7 mmol/L (ref 5–15)
BUN: 31 mg/dl — ABNORMAL HIGH (ref 7–25)
BUN: 31 mg/dl — ABNORMAL HIGH (ref 7–25)
CO2: 27 mEq/L (ref 21–32)
CO2: 27 mEq/L (ref 21–32)
Calcium: 8.9 mg/dl (ref 8.5–10.1)
Calcium: 8.9 mg/dl (ref 8.5–10.1)
Chloride: 105 mEq/L (ref 98–107)
Chloride: 105 mEq/L (ref 98–107)
Creatinine: 1.1 mg/dl (ref 0.6–1.3)
Creatinine: 1.1 mg/dl (ref 0.6–1.3)
EGFR IF NonAfrican American: 53
GFR African American: >60
GFR est AA: >60
GFR est non-AA: 53
Glucose: 257 mg/dl — ABNORMAL HIGH (ref 74–106)
Glucose: 257 mg/dl — ABNORMAL HIGH (ref 74–106)
Phosphorus: 2.4 mg/dl — ABNORMAL LOW (ref 2.5–4.9)
Phosphorus: 2.4 mg/dl — ABNORMAL LOW (ref 2.5–4.9)
Potassium: 4.2 mEq/L (ref 3.5–5.1)
Potassium: 4.2 mEq/L (ref 3.5–5.1)
Sodium: 138 mEq/L (ref 136–145)
Sodium: 138 mEq/L (ref 136–145)

## 2017-11-18 LAB — POCT GLUCOSE
POC Glucose: 240 mg/dL — ABNORMAL HIGH (ref 65–105)
POC Glucose: 281 mg/dL — ABNORMAL HIGH (ref 65–105)
POC Glucose: 278 mg/dL — ABNORMAL HIGH (ref 65–105)

## 2017-11-18 LAB — CBC WITH AUTOMATED DIFF
BASOPHILS: 0.1 % (ref 0–3)
EOSINOPHILS: 0 % (ref 0–5)
HCT: 36.8 % — ABNORMAL LOW (ref 37.0–50.0)
HGB: 12 gm/dl — ABNORMAL LOW (ref 13.0–17.2)
IMMATURE GRANULOCYTES: 2 % (ref 0.0–3.0)
LYMPHOCYTES: 8.4 % — ABNORMAL LOW (ref 28–48)
MCH: 28.3 pg (ref 25.4–34.6)
MCHC: 32.6 gm/dl (ref 30.0–36.0)
MCV: 86.8 fL (ref 80.0–98.0)
MONOCYTES: 7.2 % (ref 1–13)
MPV: 11.1 fL — ABNORMAL HIGH (ref 6.0–10.0)
NEUTROPHILS: 82.3 % — ABNORMAL HIGH (ref 34–64)
NRBC: 0 (ref 0–0)
PLATELET: 250 10*3/uL (ref 140–450)
RBC: 4.24 M/uL (ref 3.60–5.20)
RDW-SD: 40.9 (ref 36.4–46.3)
WBC: 9 10*3/uL (ref 4.0–11.0)

## 2017-11-18 LAB — GLUCOSE, POC
Glucose (POC): 240 mg/dL — ABNORMAL HIGH (ref 65–105)
Glucose (POC): 281 mg/dL — ABNORMAL HIGH (ref 65–105)
Glucose (POC): 278 mg/dL — ABNORMAL HIGH (ref 65–105)

## 2017-11-18 MED ORDER — LEVALBUTEROL 1.25 MG/3 ML SOLN FOR INHALATION
1.25 mg/3 mL | Freq: Four times a day (QID) | RESPIRATORY_TRACT | Status: DC
Start: 2017-11-18 — End: 2017-11-18
  Administered 2017-11-18 (×2): via RESPIRATORY_TRACT

## 2017-11-18 MED ORDER — ALBUTEROL SULFATE HFA 90 MCG/ACTUATION AEROSOL INHALER
90 mcg/actuation | RESPIRATORY_TRACT | 0 refills | Status: AC
Start: 2017-11-18 — End: 2017-12-23

## 2017-11-18 MED ORDER — AZITHROMYCIN 500 MG TAB
500 mg | ORAL_TABLET | Freq: Every day | ORAL | 0 refills | Status: DC
Start: 2017-11-18 — End: 2017-12-13

## 2017-11-18 MED ORDER — PREDNISONE 20 MG TAB
20 mg | Freq: Every day | ORAL | Status: DC
Start: 2017-11-18 — End: 2017-11-18
  Administered 2017-11-18: 16:00:00 via ORAL

## 2017-11-18 MED ORDER — PREDNISONE 10 MG TAB
10 mg | ORAL_TABLET | Freq: Every day | ORAL | 0 refills | Status: DC
Start: 2017-11-18 — End: 2017-12-13

## 2017-11-18 MED ORDER — PREDNISONE 20 MG TAB
20 mg | Freq: Every day | ORAL | Status: DC
Start: 2017-11-18 — End: 2017-11-18

## 2017-11-18 MED ORDER — DILTIAZEM ER 180 MG 24 HR CAP
180 mg | ORAL_CAPSULE | Freq: Every day | ORAL | 1 refills | Status: DC
Start: 2017-11-18 — End: 2017-12-20

## 2017-11-18 MED ORDER — BENZONATATE 100 MG CAP
100 mg | ORAL_CAPSULE | Freq: Three times a day (TID) | ORAL | 1 refills | Status: AC | PRN
Start: 2017-11-18 — End: 2017-11-25

## 2017-11-18 MED FILL — DILTIAZEM ER 180 MG 24 HR CAP: 180 mg | ORAL | Qty: 1

## 2017-11-18 MED FILL — MAGNESIUM OXIDE 400 MG TAB: 400 mg | ORAL | Qty: 1

## 2017-11-18 MED FILL — CLONIDINE 0.1 MG TAB: 0.1 mg | ORAL | Qty: 1

## 2017-11-18 MED FILL — SOLU-MEDROL (PF) 40 MG/ML SOLUTION FOR INJECTION: 40 mg/mL | INTRAMUSCULAR | Qty: 1

## 2017-11-18 MED FILL — MONTELUKAST 10 MG TAB: 10 mg | ORAL | Qty: 1

## 2017-11-18 MED FILL — HEPARIN (PORCINE) 5,000 UNIT/ML IJ SOLN: 5000 unit/mL | INTRAMUSCULAR | Qty: 1

## 2017-11-18 MED FILL — PREDNISONE 10 MG TAB: 10 mg | ORAL | Qty: 1

## 2017-11-18 MED FILL — LOSARTAN 50 MG TAB: 50 mg | ORAL | Qty: 2

## 2017-11-18 MED FILL — SPIRONOLACTONE 100 MG TAB: 100 mg | ORAL | Qty: 1

## 2017-11-18 MED FILL — LEVALBUTEROL 1.25 MG/3 ML SOLN FOR INHALATION: 1.25 mg/3 mL | RESPIRATORY_TRACT | Qty: 3

## 2017-11-18 MED FILL — POLYETHYLENE GLYCOL 3350 17 GRAM (100 %) ORAL POWDER PACKET: 17 gram | ORAL | Qty: 1

## 2017-11-18 MED FILL — INSULIN GLARGINE 100 UNIT/ML INJECTION: 100 unit/mL | SUBCUTANEOUS | Qty: 1

## 2017-11-18 MED FILL — QUETIAPINE SR 50 MG 24 HR TAB: 50 mg | ORAL | Qty: 3

## 2017-11-18 MED FILL — AZITHROMYCIN 500 MG IV SOLUTION: 500 mg | INTRAVENOUS | Qty: 5

## 2017-11-18 MED FILL — ROSUVASTATIN 20 MG TAB: 20 mg | ORAL | Qty: 1

## 2017-11-18 MED FILL — TRAZODONE 50 MG TAB: 50 mg | ORAL | Qty: 1

## 2017-11-18 NOTE — Other (Addendum)
----------  DocumentID: [id number]------------------------------------------------              [facility]                       Patient Education Report         Name: [name] [name], [name] [name]                  Date: 11/16/2017    MRN: [id number]                    Time: 10:39:17 PM         Patient ordered video: 'Patient Safety: Stay Safe While you are in the Hospital'    from [id number] via phone number: [phone] at 10:39:17 PM    Description: This program outlines some of the precautions patients can take to ensure a speedy recovery without extra complications. The video emphasizes the importance of communicating with the healthcare team.    ----------DocumentID: [id number]------------------------------------------------                       [facility]          Patient Education Report - Discharge Summary        Date: 11/18/2017   Time: 2:37:54 PM   Name: [name] [name], [name] [name]   MRN: [id number]      Account Number: [id number]      Education History:        Patient ordered video: 'Patient Safety: Stay Safe While you are in the Hospital' from [email or web address] on 11/16/2017 10:39:17 PM

## 2017-11-18 NOTE — Progress Notes (Signed)
Patient discharged to home. Went over all discharge instructions with patient at bedside including new prescriptions, appointments. Answered all questions to satisfaction. Pt verbally stated an understanding off all instructions, signed copy placed in patient chart.

## 2017-11-18 NOTE — Progress Notes (Signed)
67yo BF    ?? Acute on chronic hypercapnic respiratory failure due to COPD exacerbation  ?? COPD/asthma exacerbation.  ?? Lactic acidosis-interval resolution  ?? Hyperglycemia  ?? Stable left lower lobe nodule with cavity 18 mm x 18 mm  ?? Hypertension  ?? History of hepatitis  ?? History of depression      Awake, alert, anxious to go home  Heart reg 119/78  Chest symmetrically diminished  Sat 94%  Abd soft,benign.  PO good  Legs OK  I/O 550/2300  [place] OK BUN/cr 31/1.1  Afebrile WBC 9.0    Continue steroid taper  OK for discharge from my standpoint  Has f/u appt w/me for July 26th

## 2017-11-18 NOTE — Progress Notes (Signed)
67yo BF    ?? Acute on chronic hypercapnic respiratory failure due to COPD exacerbation  ?? COPD/asthma exacerbation.  ?? Lactic acidosis-interval resolution  ?? Hyperglycemia  ?? Stable left lower lobe nodule with cavity 18 mm x 18 mm  ?? Hypertension  ?? History of hepatitis  ?? History of depression

## 2017-11-18 NOTE — Discharge Summary (Addendum)
Discharge Summary   Admit Date: 11/14/2017  Discharge Date:  11/18/2017    Patient ID:  [name] [name]  67 y.o.  [birth year]    Chief Complaint   Patient presents with   ??? COPD   ??? Shortness of Breath       Patient Active Problem List    Diagnosis Date Noted   ??? COPD exacerbation ([place]) 11/14/2017   ??? SIRS (systemic inflammatory response syndrome) ([place]) 10/09/2017   ??? Hyperkalemia 09/08/2017   ??? Obtunded 09/08/2017   ??? Acute renal failure (ARF) ([place]) 09/08/2017   ??? Septic shock ([place]) 36/64/4034   ??? Metabolic encephalopathy 74/25/9563   ??? Dehydration 08/07/2017   ??? COPD with acute exacerbation ([place]) 08/07/2017   ??? Acute-on-chronic kidney injury ([place]) 08/07/2017   ??? Chest pain 06/15/2017   ??? Dyspnea 06/15/2017   ??? Type 2 diabetes mellitus with diabetic neuropathy ([place]) 12/07/2016   ??? 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 Cannabinoid hyperemesis syndrome ([place]) 08/17/2016   ??? Gastritis 08/16/2016   ??? Nausea & vomiting 08/16/2016   ??? Asthma with acute exacerbation 08/04/2016   ??? Lactic acidosis 07/24/2016   ??? Leukocytosis 07/24/2016   ??? Sepsis ([place]) 07/24/2016   ??? Asthma exacerbation 07/24/2016   ??? Type 2 diabetes mellitus with nephropathy ([place]) 06/12/2016   ??? Sacroiliitis ([place]) 11/25/2015   ??? Essential hypertension 10/06/2015   ??? 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 Lumbar and sacral osteoarthritis 09/15/2015   ??? Chronic pain syndrome 09/15/2015   ??? Type 2 diabetes mellitus with hyperglycemia, without long-term current use of insulin ([place]) 08/20/2015   ??? Acute colitis 07/02/2015   ??? Acute hyperglycemia 07/02/2015   ??? Accelerated hypertension 04/08/2015   ??? Severe headache 04/07/2015   ??? 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 PTSD (post-traumatic stress disorder) 01/29/2015   ??? Severe hypertension 07/21/2014       Discharge Diagnosis:  Asthma/COPD exacerbation  Pneumonia/Bronchitis  Normal Procal and negative blood cultures exclude sepsis as a diagnosis.   HTN  SVT   Acute on chronic hypercapnic/hypoxic respiratory failure due to #1  Lactic acidosis due to #1  Hyperglycemia  DM II  Stable LLL nodule    Current Discharge Medication List      START taking these medications    Details   dilTIAZem CD (CARDIZEM CD) 180 mg ER capsule Take 1 Cap by mouth daily.  Qty: 30 Cap, Refills: 1      benzonatate (TESSALON) 100 mg capsule Take 1 Cap by mouth three (3) times daily as needed for Cough for up to 7 days.  Qty: 30 Cap, Refills: 1      predniSONE (DELTASONE) 10 mg tablet Take 10 mg by mouth daily (with breakfast). Take 3 PO daily for 4 days,   Then 2 PO daily for 4 days  Then 1 PO daily for 4 days,   Then DC  Qty: 24 Tab, Refills: 0      azithromycin (ZITHROMAX) 500 mg tab Take 1 Tab by mouth daily.  Qty: 3 Tab, Refills: 0         CONTINUE these medications which have CHANGED    Details   albuterol (PROVENTIL HFA, VENTOLIN HFA, PROAIR HFA) 90 mcg/actuation inhaler Take 2 Puffs by inhalation every six (6) hours for 5 days, THEN 2 Puffs every four (4) hours as needed for Wheezing or Shortness of Breath for up to 30 days.  Qty: 1 Inhaler, Refills:  0         CONTINUE these medications which have NOT CHANGED    Details   tiotropium bromide (SPIRIVA RESPIMAT) 2.5 mcg/actuation inhaler Take 1 Puff by inhalation daily.  Qty: 1 Inhaler, Refills: 0    Associated Diagnoses: COPD with acute exacerbation (HCC)      cloNIDine HCl (CATAPRES) 0.3 mg tablet Take bid  Qty: 60 Tab, Refills: 1      fluticasone propion-salmeterol (ADVAIR DISKUS) 100-50 mcg/dose diskus inhaler Take 1 Puff by inhalation two (2) times a day.  Qty: 1 Inhaler, Refills: 0      metFORMIN ER (GLUCOPHAGE XR) 750 mg tablet Take 1 Tab by mouth two (2) times a day.  Qty: 30 Tab, Refills: 0      gabapentin (NEURONTIN) 100 mg capsule Take 600 mg by mouth three (3) times daily.      spironolactone (ALDACTONE) 100 mg tablet TAKE 1 TABLET BY MOUTH DAILY  Qty: 90 Tab, Refills: 0    Associated Diagnoses: Severe hypertension       montelukast (SINGULAIR) 10 mg tablet TAKE 1 TABLET BY MOUTH DAILY  Qty: 90 Tab, Refills: 3    Associated Diagnoses: Mild intermittent asthma, unspecified whether complicated; Chronic rhinitis      magnesium oxide (MAG-OX) 400 mg tablet TAKE 1 TABLET BY MOUTH DAILY  Qty: 90 Tab, Refills: 0    Associated Diagnoses: Hypomagnesemia      traZODone (DESYREL) 50 mg tablet Take 1 Tab by mouth nightly as needed for Sleep.  Qty: 20 Tab, Refills: 0      losartan (COZAAR) 100 mg tablet TAKE 1 TABLET BY MOUTH DAILY FOR HYPERTENSION  Qty: 90 Tab, Refills: 0    Associated Diagnoses: Severe hypertension      QUEtiapine SR (SEROQUEL XR) 150 mg sr tablet TAKE 1 TABLET BY MOUTH NIGHTLY  Qty: 90 Tab, Refills: 1    Associated Diagnoses: Medication refill      rosuvastatin (CRESTOR) 5 mg tablet TAKE 1 TABLET BY MOUTH NIGHTLY  Qty: 90 Tab, Refills: 1    Associated Diagnoses: Type 2 diabetes mellitus with diabetic neuropathy, without long-term current use of insulin (HCC)      albuterol (ACCUNEB) 1.25 mg/3 mL nebu 3 mL by Nebulization route every six (6) hours as needed.  Qty: 100 Each, Refills: 0      polyethylene glycol (MIRALAX) 17 gram packet Take 1 Packet by mouth daily.  Qty: 100 Packet, Refills: 1      triamcinolone (NASACORT) 55 mcg nasal inhaler 2 Sprays by Both Nostrils route daily.  Qty: 1 Bottle, Refills: 0    Associated Diagnoses: Mild intermittent asthma, unspecified whether complicated; Chronic rhinitis      Blood-Glucose Meter monitoring kit Check sugars 2 times per day, fasting and 2 hours after dinner  Qty: 1 Kit, Refills: 0    Associated Diagnoses: Diabetes mellitus, new onset (HCC)      Lancets misc Check sugars twice daily  Qty: 200 Each, Refills: 2    Associated Diagnoses: Diabetes mellitus, new onset (HCC)      glucose blood VI test strips (ASCENSIA AUTODISC VI, ONE TOUCH ULTRA TEST VI) strip Check sugars twice daily  Qty: 100 Strip, Refills: 5    Associated Diagnoses: Diabetes mellitus, new onset ([place])          STOP taking these medications       nabumetone (RELAFEN) 750 mg tablet Comments:   Reason for Stopping:         amLODIPine (  NORVASC) 10 mg tablet Comments:   Reason for Stopping:               Operative Procedures:  none    Consultants:  Pulmonary/CCM    Hospital Course:  Ms Grater is a 67 year old woman with past history of COPD/asthma, HTN DM II who presented to the ED with CO increasing SOB. She had been using HHN's at home without improvement. She denies fever chills or night sweats. In the ED she required BiPAP support to maintain normal oxygenation and ventilation. IV solumedrol was started and HHN's continued. She continued to remain in respiratory distress. Admission was recommended for further management    She was admitted to the ICU. BiPAP HHNs and IV solumedrol were continued. IV rocephin and azithromycin were provided. Her home medications were reconciled and continued. Pulmonary CCM was consulted.     She was weaned off of BiPAP and supported with O2 via nasal canula. She had consistently elevated HR and BP. Her home BP meds were continued and IV cardizem started. She was later weaned from IV to PO cardizem. PO norvasc has been DC'ed.     Over the following two days, she has been weaned off of supplemental O2 and breathing is no longer labored. She will return home with family and continue nebs at home PRN as well as a course of oral antibiotics and tapering prednisone. She will FU with her PCP and pulmonolgist for continued outpatient support and routine monitoring of the pulmonary nodule per standard routine.   Physical Exam on Discharge:  Visit Vitals  BP 103/78 (BP 1 Location: Left arm, BP Patient Position: Supine)   Pulse 61   Temp 98.4 ??F (36.9 ??C)   Resp 20   Ht 5' 10"  (1.778 m)   Wt 88.8 kg (195 lb 12.3 oz)   SpO2 97%   Breastfeeding? No   BMI 28.09 kg/m??         Constitutional: well developed, nourished, no distress and alert and oriented x 3    HENT: atraumatic, nose normal, normocephalic, left exterior ear normal, right external ear normal and oropharynx clear and moist   Eyes: conjunctiva normal, EOM normal and PERRL   Neck: ROM normal, supple, trachea normal and cervical adenopathy   Cardiovascular: heart sounds normal, intact distal pulses, normal rate and regular rhythm   Pulmonary/Chest Wall: breath sounds normal and effort normal   Abdominal: appearance normal, bowel sounds normal and soft   Genitourinary/Anorectal: deferred   Musculoskeletal: normal ROM   Neurological: awake, alert and oriented x 3 and gait normal   Skin: dry, intact and warm   Psych:  appropriate             Most Recent BMP and CBC:    Basic Metabolic Profile   Lab Results   Component Value Date/Time    Sodium 138 11/18/2017 02:55 AM    Sodium 140 11/17/2017 02:46 AM    Sodium 135 (L) 11/16/2017 10:26 AM    Potassium 4.2 11/18/2017 02:55 AM    Potassium 4.6 11/17/2017 04:14 PM    Potassium 4.3 11/17/2017 02:46 AM    Chloride 105 11/18/2017 02:55 AM    Chloride 102 11/17/2017 02:46 AM    Chloride 100 11/16/2017 10:26 AM    CO2 27 11/18/2017 02:55 AM    CO2 28 11/17/2017 02:46 AM    CO2 26 11/16/2017 10:26 AM    Anion gap 7 11/18/2017 02:55 AM    Anion gap 10  11/17/2017 02:46 AM    Anion gap 10 11/16/2017 10:26 AM    Glucose 257 (H) 11/18/2017 02:55 AM    Glucose 217 (H) 11/17/2017 02:46 AM    Glucose 390 (H) 11/16/2017 10:26 AM    BUN 31 (H) 11/18/2017 02:55 AM    BUN 30 (H) 11/17/2017 02:46 AM    BUN 18 11/16/2017 10:26 AM    Creatinine 1.1 11/18/2017 02:55 AM    Creatinine 1.2 11/17/2017 02:46 AM    Creatinine 1.2 11/16/2017 10:26 AM    BUN/Creatinine ratio 10 (L) 09/20/2017 04:05 PM    BUN/Creatinine ratio 24 (H) 06/10/2016 01:00 PM    BUN/Creatinine ratio 19 08/21/2015 01:40 PM    GFR est AA >60.0 11/18/2017 02:55 AM    GFR est AA 58.0 11/17/2017 02:46 AM    GFR est AA 58.0 11/16/2017 10:26 AM    GFR est non-AA 53 11/18/2017 02:55 AM    GFR est non-AA 48 11/17/2017 02:46 AM     GFR est non-AA 48 11/16/2017 10:26 AM    Calcium 8.9 11/18/2017 02:55 AM    Calcium 9.2 11/17/2017 02:46 AM    Calcium 9.8 11/16/2017 10:26 AM           CBC w/Diff    Lab Results   Component Value Date/Time    WBC 9.0 11/18/2017 02:55 AM    WBC 9.4 11/17/2017 02:46 AM    WBC 12.0 (H) 11/16/2017 06:30 AM    Hemoglobin (POC) 11.8 12/16/2014 02:13 PM    HGB 12.0 (L) 11/18/2017 02:55 AM    HGB 12.5 (L) 11/17/2017 02:46 AM    HGB 13.0 11/16/2017 06:30 AM    HCT 36.8 (L) 11/18/2017 02:55 AM    HCT 38.6 11/17/2017 02:46 AM    HCT 39.9 11/16/2017 06:30 AM    PLATELET 250 11/18/2017 02:55 AM    PLATELET 235 11/17/2017 02:46 AM    PLATELET 251 11/16/2017 06:30 AM    MCV 86.8 11/18/2017 02:55 AM    MCV 87.1 11/17/2017 02:46 AM    MCV 88.7 11/16/2017 06:30 AM       Recent Labs     11/18/17  0255 11/17/17  0246 11/16/17  0630   WBC 9.0 9.4 12.0*   RBC 4.24 4.43 4.50   HGB 12.0* 12.5* 13.0   HCT 36.8* 38.6 39.9   PLT 250 235 251   GRANS 82.3* 85.3* 83.6*   LYMPH 8.4* 7.7* 10.8*   MONOS 7.2 6.2 5.2   EOS 0.0 0.0 0.0   BASOS 0.1 0.1 0.1              Condition at discharge: Afebrile  Ambulating  Eating, Drinking, Voiding  Stable    Disposition:  Home    Home or Self Care    PCP:  Wallene Huh, PA-C

## 2017-11-18 NOTE — Other (Cosign Needed)
Home Care Face to Face Encounter  Patient???s Name: Anne ShutterMonica R Packard    Date of Birth: 08/20/1950    Primary Diagnosis: COPD exacerbation     Date of Face to Face:   11/19/17                                  Face to Face Encounter findings are related to primary reason for home care:   yes.     1. I certify that the patient needs intermittent care as follows: skilled nursing care:  skilled observation/assessment, patient education, complex care plan management and rehabilitative nursing  physical therapy: strengthening  occupational therapy:  ADL safety (ie. cooking, bathing, dressing)    2. I certify that this patient is homebound, that is: 1) patient requires the use of a tbd  device, special transportation, or assistance of another to leave the home; or 2) patient's condition makes leaving the home medically contraindicated; and 3) patient has a normal inability to leave the home and leaving the home requires considerable and taxing effort.  Patient may leave the home for infrequent and short duration for medical reasons, and occasional absences for non-medical reasons. Homebound status is due to the following functional limitations: Patient with increased shortness of breath with all exertional activity limiting ambulation outside the home. Patient with strength deficits limiting the ability to carry portable O2 for distances outside the home without the assistance of a caregiver.    3. I certify that this patient is under my care and that I, or a nurse practitioner or physician???s assistant, or clinical nurse specialist, or certified nurse midwife, working with me, had a Face-to-Face Encounter that meets the physician Face-to-Face Encounter requirements.  The following are the clinical findings from the Face-to-Face encounter that support the need for skilled services and is a summary of the encounter: yes     See discharge summary      Johny ShockLoresa S Watson, RN  11/19/2017

## 2017-11-18 NOTE — Progress Notes (Signed)
Patient discharged to home. Went over all discharge instructions with patient at bedside including new prescriptions, appointments. Answered all questions to satisfaction. Pt verbally stated an understanding off all instructions, signed copy placed in patient chart.

## 2017-11-18 NOTE — Discharge Summary (Signed)
Discharge Summary   Admit Date: 11/14/2017  Discharge Date:  11/18/2017    Patient ID:  [name] [name]  67 y.o.  [birth year]    Chief Complaint   Patient presents with   ??? COPD   ??? Shortness of Breath       Patient Active Problem List    Diagnosis Date Noted   ??? COPD exacerbation ([place]) 11/14/2017   ??? SIRS (systemic inflammatory response syndrome) ([place]) 10/09/2017   ??? Hyperkalemia 09/08/2017   ??? Obtunded 09/08/2017   ??? Acute renal failure (ARF) ([place]) 09/08/2017   ??? Septic shock ([place]) 78/24/2353   ??? Metabolic encephalopathy 61/44/3154   ??? Dehydration 08/07/2017   ??? COPD with acute exacerbation ([place]) 08/07/2017   ??? Acute-on-chronic kidney injury ([place]) 08/07/2017   ??? Chest pain 06/15/2017   ??? Dyspnea 06/15/2017   ??? Type 2 diabetes mellitus with diabetic neuropathy ([place]) 12/07/2016   ??? 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 Cannabinoid hyperemesis syndrome ([place]) 08/17/2016   ??? Gastritis 08/16/2016   ??? Nausea & vomiting 08/16/2016   ??? Asthma with acute exacerbation 08/04/2016   ??? Lactic acidosis 07/24/2016   ??? Leukocytosis 07/24/2016   ??? Sepsis ([place]) 07/24/2016   ??? Asthma exacerbation 07/24/2016   ??? Type 2 diabetes mellitus with nephropathy ([place]) 06/12/2016   ??? Sacroiliitis ([place]) 11/25/2015   ??? Essential hypertension 10/06/2015   ??? 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 Lumbar and sacral osteoarthritis 09/15/2015   ??? Chronic pain syndrome 09/15/2015   ??? Type 2 diabetes mellitus with hyperglycemia, without long-term current use of insulin ([place]) 08/20/2015   ??? Acute colitis 07/02/2015   ??? Acute hyperglycemia 07/02/2015   ??? Accelerated hypertension 04/08/2015   ??? Severe headache 04/07/2015   ??? 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 PTSD (post-traumatic stress disorder) 01/29/2015   ??? Severe hypertension 07/21/2014       Discharge Diagnosis:  Asthma/COPD exacerbation  Pneumonia/Bronchitis  Normal Procal and negative blood cultures exclude sepsis as a diagnosis.   HTN  SVT  Acute on  chronic hypercapnic/hypoxic respiratory failure due to #1  Lactic acidosis due to #1  Hyperglycemia  DM II  Stable LLL nodule    Current Discharge Medication List      START taking these medications    Details   dilTIAZem CD (CARDIZEM CD) 180 mg ER capsule Take 1 Cap by mouth daily.  Qty: 30 Cap, Refills: 1      benzonatate (TESSALON) 100 mg capsule Take 1 Cap by mouth three (3) times daily as needed for Cough for up to 7 days.  Qty: 30 Cap, Refills: 1      predniSONE (DELTASONE) 10 mg tablet Take 10 mg by mouth daily (with breakfast). Take 3 PO daily for 4 days,   Then 2 PO daily for 4 days  Then 1 PO daily for 4 days,   Then DC  Qty: 24 Tab, Refills: 0      azithromycin (ZITHROMAX) 500 mg tab Take 1 Tab by mouth daily.  Qty: 3 Tab, Refills: 0         CONTINUE these medications which have CHANGED    Details   albuterol (PROVENTIL HFA, VENTOLIN HFA, PROAIR HFA) 90 mcg/actuation inhaler Take 2 Puffs by inhalation every six (6) hours for 5 days, THEN 2 Puffs every four (4) hours as needed for Wheezing or Shortness of Breath for up to 30 days.  Qty: 1 Inhaler, Refills:  0         CONTINUE these medications which have NOT CHANGED    Details   tiotropium bromide (SPIRIVA RESPIMAT) 2.5 mcg/actuation inhaler Take 1 Puff by inhalation daily.  Qty: 1 Inhaler, Refills: 0    Associated Diagnoses: COPD with acute exacerbation (HCC)      cloNIDine HCl (CATAPRES) 0.3 mg tablet Take bid  Qty: 60 Tab, Refills: 1      fluticasone propion-salmeterol (ADVAIR DISKUS) 100-50 mcg/dose diskus inhaler Take 1 Puff by inhalation two (2) times a day.  Qty: 1 Inhaler, Refills: 0      metFORMIN ER (GLUCOPHAGE XR) 750 mg tablet Take 1 Tab by mouth two (2) times a day.  Qty: 30 Tab, Refills: 0      gabapentin (NEURONTIN) 100 mg capsule Take 600 mg by mouth three (3) times daily.      spironolactone (ALDACTONE) 100 mg tablet TAKE 1 TABLET BY MOUTH DAILY  Qty: 90 Tab, Refills: 0    Associated Diagnoses: Severe hypertension      montelukast  (SINGULAIR) 10 mg tablet TAKE 1 TABLET BY MOUTH DAILY  Qty: 90 Tab, Refills: 3    Associated Diagnoses: Mild intermittent asthma, unspecified whether complicated; Chronic rhinitis      magnesium oxide (MAG-OX) 400 mg tablet TAKE 1 TABLET BY MOUTH DAILY  Qty: 90 Tab, Refills: 0    Associated Diagnoses: Hypomagnesemia      traZODone (DESYREL) 50 mg tablet Take 1 Tab by mouth nightly as needed for Sleep.  Qty: 20 Tab, Refills: 0      losartan (COZAAR) 100 mg tablet TAKE 1 TABLET BY MOUTH DAILY FOR HYPERTENSION  Qty: 90 Tab, Refills: 0    Associated Diagnoses: Severe hypertension      QUEtiapine SR (SEROQUEL XR) 150 mg sr tablet TAKE 1 TABLET BY MOUTH NIGHTLY  Qty: 90 Tab, Refills: 1    Associated Diagnoses: Medication refill      rosuvastatin (CRESTOR) 5 mg tablet TAKE 1 TABLET BY MOUTH NIGHTLY  Qty: 90 Tab, Refills: 1    Associated Diagnoses: Type 2 diabetes mellitus with diabetic neuropathy, without long-term current use of insulin (HCC)      albuterol (ACCUNEB) 1.25 mg/3 mL nebu 3 mL by Nebulization route every six (6) hours as needed.  Qty: 100 Each, Refills: 0      polyethylene glycol (MIRALAX) 17 gram packet Take 1 Packet by mouth daily.  Qty: 100 Packet, Refills: 1      triamcinolone (NASACORT) 55 mcg nasal inhaler 2 Sprays by Both Nostrils route daily.  Qty: 1 Bottle, Refills: 0    Associated Diagnoses: Mild intermittent asthma, unspecified whether complicated; Chronic rhinitis      Blood-Glucose Meter monitoring kit Check sugars 2 times per day, fasting and 2 hours after dinner  Qty: 1 Kit, Refills: 0    Associated Diagnoses: Diabetes mellitus, new onset (HCC)      Lancets misc Check sugars twice daily  Qty: 200 Each, Refills: 2    Associated Diagnoses: Diabetes mellitus, new onset (HCC)      glucose blood VI test strips (ASCENSIA AUTODISC VI, ONE TOUCH ULTRA TEST VI) strip Check sugars twice daily  Qty: 100 Strip, Refills: 5    Associated Diagnoses: Diabetes mellitus, new onset ([place])         STOP taking these  medications       nabumetone (RELAFEN) 750 mg tablet Comments:   Reason for Stopping:         amLODIPine (  NORVASC) 10 mg tablet Comments:   Reason for Stopping:               Operative Procedures:  none    Consultants:  Pulmonary/CCM    Hospital Course:  [name] [name] is a 67 year old woman with past history of COPD/asthma, HTN DM II who presented to the ED with CO increasing SOB. She had been using HHN's at home without improvement. She denies fever chills or night sweats. In the ED she required BiPAP support to maintain normal oxygenation and ventilation. IV solumedrol was started and HHN's continued. She continued to remain in respiratory distress. Admission was recommended for further management    She was admitted to the ICU. BiPAP HHNs and IV solumedrol were continued. IV rocephin and azithromycin were provided. Her home medications were reconciled and continued. Pulmonary CCM was consulted.     She was weaned off of BiPAP and supported with O2 via nasal canula. She had consistently elevated HR and BP. Her home BP meds were continued and IV cardizem started. She was later weaned from IV to PO cardizem. PO norvasc has been DC'ed.     Over the following two days, she has been weaned off of supplemental O2 and breathing is no longer labored. She will return home with family and continue nebs at home PRN as well as a course of oral antibiotics and tapering prednisone. She will FU with her PCP and pulmonolgist for continued outpatient support and routine monitoring of the pulmonary nodule per standard routine.     Physical Exam on Discharge:  Visit Vitals  BP 103/78 (BP 1 Location: Left arm, BP Patient Position: Supine)   Pulse 61   Temp 98.4 ??F (36.9 ??C)   Resp 20   Ht '5\' 10"'  (1.778 m)   Wt 88.8 kg (195 lb 12.3 oz)   SpO2 97%   Breastfeeding? No   BMI 28.09 kg/m??         Constitutional: well developed, nourished, no distress and alert and oriented x 3   HENT: atraumatic, nose normal, normocephalic, left exterior ear  normal, right external ear normal and oropharynx clear and moist   Eyes: conjunctiva normal, EOM normal and PERRL   Neck: ROM normal, supple, trachea normal and cervical adenopathy   Cardiovascular: heart sounds normal, intact distal pulses, normal rate and regular rhythm   Pulmonary/Chest Wall: breath sounds normal and effort normal   Abdominal: appearance normal, bowel sounds normal and soft   Genitourinary/Anorectal: deferred   Musculoskeletal: normal ROM   Neurological: awake, alert and oriented x 3 and gait normal   Skin: dry, intact and warm   Psych:  appropriate               Most Recent BMP and CBC:    Basic Metabolic Profile   Lab Results   Component Value Date/Time    Sodium 138 11/18/2017 02:55 AM    Sodium 140 11/17/2017 02:46 AM    Sodium 135 (L) 11/16/2017 10:26 AM    Potassium 4.2 11/18/2017 02:55 AM    Potassium 4.6 11/17/2017 04:14 PM    Potassium 4.3 11/17/2017 02:46 AM    Chloride 105 11/18/2017 02:55 AM    Chloride 102 11/17/2017 02:46 AM    Chloride 100 11/16/2017 10:26 AM    CO2 27 11/18/2017 02:55 AM    CO2 28 11/17/2017 02:46 AM    CO2 26 11/16/2017 10:26 AM    Anion gap 7 11/18/2017 02:55 AM  Anion gap 10 11/17/2017 02:46 AM    Anion gap 10 11/16/2017 10:26 AM    Glucose 257 (H) 11/18/2017 02:55 AM    Glucose 217 (H) 11/17/2017 02:46 AM    Glucose 390 (H) 11/16/2017 10:26 AM    BUN 31 (H) 11/18/2017 02:55 AM    BUN 30 (H) 11/17/2017 02:46 AM    BUN 18 11/16/2017 10:26 AM    Creatinine 1.1 11/18/2017 02:55 AM    Creatinine 1.2 11/17/2017 02:46 AM    Creatinine 1.2 11/16/2017 10:26 AM    BUN/Creatinine ratio 10 (L) 09/20/2017 04:05 PM    BUN/Creatinine ratio 24 (H) 06/10/2016 01:00 PM    BUN/Creatinine ratio 19 08/21/2015 01:40 PM    GFR est AA >60.0 11/18/2017 02:55 AM    GFR est AA 58.0 11/17/2017 02:46 AM    GFR est AA 58.0 11/16/2017 10:26 AM    GFR est non-AA 53 11/18/2017 02:55 AM    GFR est non-AA 48 11/17/2017 02:46 AM    GFR est non-AA 48 11/16/2017 10:26 AM    Calcium 8.9  11/18/2017 02:55 AM    Calcium 9.2 11/17/2017 02:46 AM    Calcium 9.8 11/16/2017 10:26 AM           CBC w/Diff    Lab Results   Component Value Date/Time    WBC 9.0 11/18/2017 02:55 AM    WBC 9.4 11/17/2017 02:46 AM    WBC 12.0 (H) 11/16/2017 06:30 AM    Hemoglobin (POC) 11.8 12/16/2014 02:13 PM    HGB 12.0 (L) 11/18/2017 02:55 AM    HGB 12.5 (L) 11/17/2017 02:46 AM    HGB 13.0 11/16/2017 06:30 AM    HCT 36.8 (L) 11/18/2017 02:55 AM    HCT 38.6 11/17/2017 02:46 AM    HCT 39.9 11/16/2017 06:30 AM    PLATELET 250 11/18/2017 02:55 AM    PLATELET 235 11/17/2017 02:46 AM    PLATELET 251 11/16/2017 06:30 AM    MCV 86.8 11/18/2017 02:55 AM    MCV 87.1 11/17/2017 02:46 AM    MCV 88.7 11/16/2017 06:30 AM       Recent Labs     11/18/17  0255 11/17/17  0246 11/16/17  0630   WBC 9.0 9.4 12.0*   RBC 4.24 4.43 4.50   HGB 12.0* 12.5* 13.0   HCT 36.8* 38.6 39.9   PLT 250 235 251   GRANS 82.3* 85.3* 83.6*   LYMPH 8.4* 7.7* 10.8*   MONOS 7.2 6.2 5.2   EOS 0.0 0.0 0.0   BASOS 0.1 0.1 0.1              Condition at discharge: Afebrile  Ambulating  Eating, Drinking, Voiding  Stable    Disposition:  Home    Home or Self Care    PCP:  Wallene Huh, PA-C

## 2017-11-18 NOTE — Progress Notes (Signed)
67yo BF    ?? Acute on chronic hypercapnic respiratory failure due to COPD exacerbation  ?? COPD/asthma exacerbation.  ?? Lactic acidosis-interval resolution  ?? Hyperglycemia  ?? Stable left lower lobe nodule with cavity 18 mm x 18 mm  ?? Hypertension  ?? History of hepatitis  ?? History of depression

## 2017-11-18 NOTE — Progress Notes (Signed)
67yo BF    ?? Acute on chronic hypercapnic respiratory failure due to COPD exacerbation  ?? COPD/asthma exacerbation.  ?? Lactic acidosis-interval resolution  ?? Hyperglycemia  ?? Stable left lower lobe nodule with cavity 18 mm x 18 mm  ?? Hypertension  ?? History of hepatitis  ?? History of depression      Awake, alert, anxious to go home  Heart reg 119/78  Chest symmetrically diminished  Sat 94%  Abd soft,benign.  PO good  Legs OK  I/O 550/2300  [place] OK BUN/cr 31/1.1  Afebrile WBC 9.0    Continue steroid taper  OK for discharge from my standpoint  Has f/u appt w/me for July 26th

## 2017-11-20 ENCOUNTER — Encounter: Primary: Physician Assistant

## 2017-11-20 LAB — CULTURE, BLOOD 1
BLOOD CULTURE RESULT: NO GROWTH
BLOOD CULTURE RESULT: NO GROWTH

## 2017-11-20 LAB — CULTURE, BLOOD
Blood Culture Result: NO GROWTH
Blood Culture Result: NO GROWTH

## 2017-11-20 NOTE — Progress Notes (Signed)
Hospital Discharge Follow-Up      Date/Time:  11/20/2017 1:11 PM    Patient was admitted to [facility] on 11/14/17 and discharged on 11/18/17 for COPD exacerbation. The physician discharge summary was available at the time of outreach. Nurse Navigator attempted to contact patient.   Message left introducing myself, the purpose of the call and giving my contact information.  Requested that patient call back at her earliest convenience    Current Outpatient Medications   Medication Sig   ??? albuterol (PROVENTIL HFA, VENTOLIN HFA, PROAIR HFA) 90 mcg/actuation inhaler Take 2 Puffs by inhalation every six (6) hours for 5 days, THEN 2 Puffs every four (4) hours as needed for Wheezing or Shortness of Breath for up to 30 days.   ??? dilTIAZem CD (CARDIZEM CD) 180 mg ER capsule Take 1 Cap by mouth daily.   ??? benzonatate (TESSALON) 100 mg capsule Take 1 Cap by mouth three (3) times daily as needed for Cough for up to 7 days.   ??? predniSONE (DELTASONE) 10 mg tablet Take 10 mg by mouth daily (with breakfast). Take 3 PO daily for 4 days,   Then 2 PO daily for 4 days  Then 1 PO daily for 4 days,   Then DC   ??? azithromycin (ZITHROMAX) 500 mg tab Take 1 Tab by mouth daily.   ??? tiotropium bromide (SPIRIVA RESPIMAT) 2.5 mcg/actuation inhaler Take 1 Puff by inhalation daily.   ??? cloNIDine HCl (CATAPRES) 0.3 mg tablet Take bid (Patient taking differently: Take 1 Tab by mouth two (2) times a day. Take bid)   ??? fluticasone propion-salmeterol (ADVAIR DISKUS) 100-50 mcg/dose diskus inhaler Take 1 Puff by inhalation two (2) times a day.   ??? metFORMIN ER (GLUCOPHAGE XR) 750 mg tablet Take 1 Tab by mouth two (2) times a day.   ??? gabapentin (NEURONTIN) 100 mg capsule Take 600 mg by mouth three (3) times daily.   ??? spironolactone (ALDACTONE) 100 mg tablet TAKE 1 TABLET BY MOUTH DAILY   ??? montelukast (SINGULAIR) 10 mg tablet TAKE 1 TABLET BY MOUTH DAILY   ??? magnesium oxide (MAG-OX) 400 mg tablet TAKE 1 TABLET BY MOUTH DAILY    ??? traZODone (DESYREL) 50 mg tablet Take 1 Tab by mouth nightly as needed for Sleep.   ??? losartan (COZAAR) 100 mg tablet TAKE 1 TABLET BY MOUTH DAILY FOR HYPERTENSION   ??? QUEtiapine SR (SEROQUEL XR) 150 mg sr tablet TAKE 1 TABLET BY MOUTH NIGHTLY   ??? rosuvastatin (CRESTOR) 5 mg tablet TAKE 1 TABLET BY MOUTH NIGHTLY   ??? albuterol (ACCUNEB) 1.25 mg/3 mL nebu 3 mL by Nebulization route every six (6) hours as needed.   ??? polyethylene glycol (MIRALAX) 17 gram packet Take 1 Packet by mouth daily.   ??? triamcinolone (NASACORT) 55 mcg nasal inhaler 2 Sprays by Both Nostrils route daily.   ??? Blood-Glucose Meter monitoring kit Check sugars 2 times per day, fasting and 2 hours after dinner   ??? Lancets misc Check sugars twice daily   ??? glucose blood VI test strips (ASCENSIA AUTODISC VI, ONE TOUCH ULTRA TEST VI) strip Check sugars twice daily     No current facility-administered medications for this visit.        There are no discontinued medications.    [facility] follow up appointment(s):   Future Appointments   Date Time Provider [place]   11/22/2017 To Be Determined Judd Gaudier [place]   11/23/2017  1:15 PM Wallene Huh, PA-C [place]   11/25/2017 To Be Determined Judd Gaudier [place]   11/29/2017 To Be Determined Judd Gaudier [place] [facility]   12/02/2017 To Be Determined Goling, Chit Chit F, PT [facility]        Goals     ??? Attends follow-up appointments as directed.     ??? Prevent complications post hospitalization.      Patient will attend all doctors appointments as scheduled  Nurse Navigator will contact patient weekly for at least 4 weeks after discharge    10/20/17  Patient did not attend PCP appointment on 10/17/17. Has not yet rescheduled appointment      ??? Understands red flags post discharge.      ??? Confusion  ??? Fast breathing  ??? Chills or shaking  ??? Fever or low body temperature  ??? Fast heart beat  ??? Lightheadedness  ??? Less urine output   ??? Skin rash or skin color changes    09/15/2017 mailed sepsis letter to patient

## 2017-11-20 NOTE — Progress Notes (Signed)
 Hospital Discharge Follow-Up      Date/Time:  11/20/2017 1:11 PM    Patient was admitted to [facility] on 11/14/17 and discharged on 11/18/17 for COPD exacerbation. The physician discharge summary was available at the time of outreach. Nurse Navigator attempted to contact patient.   Message left introducing myself, the purpose of the call and giving my contact information.  Requested that patient call back at her earliest convenience    Current Outpatient Medications   Medication Sig   . albuterol  (PROVENTIL  HFA, VENTOLIN  HFA, PROAIR  HFA) 90 mcg/actuation inhaler Take 2 Puffs by inhalation every six (6) hours for 5 days, THEN 2 Puffs every four (4) hours as needed for Wheezing or Shortness of Breath for up to 30 days.   . dilTIAZem CD (CARDIZEM CD) 180 mg ER capsule Take 1 Cap by mouth daily.   . benzonatate (TESSALON) 100 mg capsule Take 1 Cap by mouth three (3) times daily as needed for Cough for up to 7 days.   . predniSONE (DELTASONE) 10 mg tablet Take 10 mg by mouth daily (with breakfast). Take 3 PO daily for 4 days,   Then 2 PO daily for 4 days  Then 1 PO daily for 4 days,   Then DC   . azithromycin (ZITHROMAX) 500 mg tab Take 1 Tab by mouth daily.   SABRA tiotropium bromide (SPIRIVA RESPIMAT) 2.5 mcg/actuation inhaler Take 1 Puff by inhalation daily.   . cloNIDine HCl (CATAPRES) 0.3 mg tablet Take bid (Patient taking differently: Take 1 Tab by mouth two (2) times a day. Take bid)   . fluticasone  propion-salmeterol (ADVAIR DISKUS) 100-50 mcg/dose diskus inhaler Take 1 Puff by inhalation two (2) times a day.   . metFORMIN ER (GLUCOPHAGE XR) 750 mg tablet Take 1 Tab by mouth two (2) times a day.   . gabapentin (NEURONTIN) 100 mg capsule Take 600 mg by mouth three (3) times daily.   . spironolactone  (ALDACTONE ) 100 mg tablet TAKE 1 TABLET BY MOUTH DAILY   . montelukast  (SINGULAIR ) 10 mg tablet TAKE 1 TABLET BY MOUTH DAILY   . magnesium  oxide (MAG-OX) 400 mg tablet TAKE 1 TABLET BY MOUTH DAILY   .  traZODone (DESYREL) 50 mg tablet Take 1 Tab by mouth nightly as needed for Sleep.   . losartan  (COZAAR ) 100 mg tablet TAKE 1 TABLET BY MOUTH DAILY FOR HYPERTENSION   . QUEtiapine  SR (SEROQUEL  XR) 150 mg sr tablet TAKE 1 TABLET BY MOUTH NIGHTLY   . rosuvastatin  (CRESTOR ) 5 mg tablet TAKE 1 TABLET BY MOUTH NIGHTLY   . albuterol  (ACCUNEB ) 1.25 mg/3 mL nebu 3 mL by Nebulization route every six (6) hours as needed.   . polyethylene glycol (MIRALAX ) 17 gram packet Take 1 Packet by mouth daily.   SABRA triamcinolone (NASACORT) 55 mcg nasal inhaler 2 Sprays by Both Nostrils route daily.   . Blood-Glucose Meter monitoring kit Check sugars 2 times per day, fasting and 2 hours after dinner   . Lancets misc Check sugars twice daily   . glucose blood VI test strips (ASCENSIA AUTODISC VI, ONE TOUCH ULTRA TEST VI) strip Check sugars twice daily     No current facility-administered medications for this visit.        There are no discontinued medications.    [facility] follow up appointment(s):   Future Appointments   Date Time Provider Department Center   11/22/2017 To Be Determined Colletta Rexene JINNY JOSETTA [facility]   11/23/2017  1:15 PM Lucious Gun, PA-C [facility] [facility]   11/25/2017 To Be Determined Colletta Rexene JINNY JOSETTA [facility]   11/29/2017 To Be Determined Colletta Rexene JINNY JOSETTA [facility]   12/02/2017 To Be Determined Goling, Chit Chit F, PT [facility]        Goals     . Attends follow-up appointments as directed.     . Prevent complications post hospitalization.      Patient will attend all doctors appointments as scheduled  Nurse Navigator will contact patient weekly for at least 4 weeks after discharge    10/20/17  Patient did not attend PCP appointment on 10/17/17. Has not yet rescheduled appointment      . Understands red flags post discharge.      . Confusion  . Fast breathing  . Chills or shaking  . Fever or low body temperature  . Fast heart beat  . Lightheadedness  . Less urine output  . Skin rash or skin color  changes    09/15/2017 mailed sepsis letter to patient

## 2017-11-21 NOTE — Telephone Encounter (Signed)
[employer] mail order sent a request for the following items to be prescribed:   Accu-Chek Aviva Plus meter  Accu-Chek Plus test strips  Accu-Chek Softclix lancets  Accu-Chek Aviva solution   BD alcohol swabs.

## 2017-11-22 ENCOUNTER — Encounter: Primary: Physician Assistant

## 2017-11-22 NOTE — Telephone Encounter (Signed)
Can you please put this in as refill encounter so I can send those over?

## 2017-11-23 ENCOUNTER — Encounter: Attending: Physician Assistant | Primary: Physician Assistant

## 2017-11-23 MED ORDER — ALCOHOL SWABS
MEDICATED_PAD | CUTANEOUS | 5 refills | Status: DC
Start: 2017-11-23 — End: 2018-08-05

## 2017-11-23 MED ORDER — LANCETS
5 refills | Status: DC
Start: 2017-11-23 — End: 2018-08-05

## 2017-11-23 MED ORDER — BLOOD-GLUCOSE METER
0 refills | Status: DC
Start: 2017-11-23 — End: 2018-08-05

## 2017-11-23 MED ORDER — BLOOD GLUCOSE CONTROL HIGH AND LOW SOLUTION
0 refills | Status: DC
Start: 2017-11-23 — End: 2018-08-05

## 2017-11-23 MED ORDER — BLOOD SUGAR DIAGNOSTIC TEST STRIPS
ORAL_STRIP | 5 refills | Status: DC
Start: 2017-11-23 — End: 2018-08-05

## 2017-11-23 NOTE — Telephone Encounter (Signed)
Done

## 2017-11-25 ENCOUNTER — Encounter: Primary: Physician Assistant

## 2017-11-27 ENCOUNTER — Encounter: Primary: Physician Assistant

## 2017-11-27 NOTE — Telephone Encounter (Signed)
[facility] stating patient's blood sugar is 597. Her last meal was about 3 hours ago. Patient is currently on a tapering dose of prednisone since being released from the hospital. Patient is currently on Metformin 750 mg BID. The nurse states the only symptoms the patient is having is increased fatigue. Please advise.

## 2017-11-27 NOTE — Telephone Encounter (Signed)
After speaking with provider here in the office, a message was left for Rose to advise patient to go to the ER so insulin may be administered to get blood sugar down.

## 2017-11-28 ENCOUNTER — Encounter: Primary: Physician Assistant

## 2017-11-28 NOTE — Progress Notes (Signed)
Hospital Discharge Follow-Up      Date/Time:  11/28/2017 2:04 PM    Patient was admitted to [facility] on 6/4 and discharged on 6/8 for COPD exacerbation. The physician discharge summary was available at the time of outreach.  Patient was contacted within 2 business days of discharge.      Top Challenges reviewed with the provider   Patient is noncompliant with her diabetes         Method of communication with provider :chart routing    Inpatient RRAT score: 46  Was this a readmission? no   Patient stated reason for the readmission: NA    Nurse Navigator (NN) contacted the patient by telephone to perform post hospital discharge assessment. Verified name and DOB with patient as identifiers. Provided introduction to self, and explanation of the Nurse Navigator role.     Reviewed discharge instructions and red flags with patient who verbalized understanding. Patient given an opportunity to ask questions and does not have any further questions or concerns at this time. The patient agrees to contact the PCP office for questions related to their healthcare. NN provided contact information for future reference.    Disease Specific:   COPD    Summary of patient's top problems:  1. Diabetic aherence  2. Advanced COPD  3.     Home Health orders at discharge: PT, [place]: [place]  Date of initial visit: unable to determine    Durable Medical Equipment ordered/company: [employer] mail order  Durable Medical Equipment received: Blood glucose maching and all supplies.    Barriers to care? financial, lack of knowledge about disease, level of motivation    Advance Care Planning:   Does patient have an Advance Directive:  Says on file, unable to find in system     Medication(s):   New Medications at Discharge: albuterol-ipratropium  Changed Medications at Discharge: metformin  Discontinued Medications at Discharge: NA    Medication reconciliation was performed with patient, who verbalizes  understanding of administration of home medications.  There were no barriers to obtaining medications identified at this time.    Patient is not compliant with checking blood sugars and adhering to DM diet, refused teaching.    Referral to Pharm D needed: no     Current Outpatient Medications   Medication Sig   ??? amLODIPine (NORVASC) 10 mg tablet Take  by mouth daily.   ??? nabumetone (RELAFEN) 750 mg tablet Take 750 mg by mouth two (2) times a day.   ??? albuterol-ipratropium (DUO-NEB) 2.5 mg-0.5 mg/3 ml nebu 3 mL by Nebulization route.   ??? albuterol (PROVENTIL HFA, VENTOLIN HFA, PROAIR HFA) 90 mcg/actuation inhaler Take 2 Puffs by inhalation every six (6) hours for 5 days, THEN 2 Puffs every four (4) hours as needed for Wheezing or Shortness of Breath for up to 30 days.   ??? metFORMIN ER (GLUCOPHAGE XR) 750 mg tablet Take 1 Tab by mouth two (2) times a day.   ??? montelukast (SINGULAIR) 10 mg tablet TAKE 1 TABLET BY MOUTH DAILY   ??? magnesium oxide (MAG-OX) 400 mg tablet TAKE 1 TABLET BY MOUTH DAILY   ??? traZODone (DESYREL) 50 mg tablet Take 1 Tab by mouth nightly as needed for Sleep.   ??? losartan (COZAAR) 100 mg tablet TAKE 1 TABLET BY MOUTH DAILY FOR HYPERTENSION   ??? QUEtiapine SR (SEROQUEL XR) 150 mg sr tablet TAKE 1 TABLET BY MOUTH NIGHTLY   ??? rosuvastatin (CRESTOR) 5  mg tablet TAKE 1 TABLET BY MOUTH NIGHTLY   ??? albuterol (ACCUNEB) 1.25 mg/3 mL nebu 3 mL by Nebulization route every six (6) hours as needed.   ??? Blood Glucose Control High&Low (ACCU-CHEK AVIVA CONTROL SOLN) soln Check blood sugar daily   ??? Blood-Glucose Meter (ACCU-CHEK AVIVA PLUS METER) misc Check blood sugar daily   ??? glucose blood VI test strips (ACCU-CHEK AVIVA PLUS TEST STRP) strip Check blood sugar daily   ??? alcohol swabs (BD SINGLE USE SWABS REGULAR) padm Check blood sugar daily   ??? lancets (ACCU-CHEK SOFTCLIX LANCETS) misc Check blood sugar daily   ??? dilTIAZem CD (CARDIZEM CD) 180 mg ER capsule Take 1 Cap by mouth daily.    ??? predniSONE (DELTASONE) 10 mg tablet Take 10 mg by mouth daily (with breakfast). Take 3 PO daily for 4 days,   Then 2 PO daily for 4 days  Then 1 PO daily for 4 days,   Then DC   ??? azithromycin (ZITHROMAX) 500 mg tab Take 1 Tab by mouth daily.   ??? tiotropium bromide (SPIRIVA RESPIMAT) 2.5 mcg/actuation inhaler Take 1 Puff by inhalation daily.   ??? cloNIDine HCl (CATAPRES) 0.3 mg tablet Take bid (Patient taking differently: Take 1 Tab by mouth two (2) times a day. Take bid)   ??? fluticasone propion-salmeterol (ADVAIR DISKUS) 100-50 mcg/dose diskus inhaler Take 1 Puff by inhalation two (2) times a day.   ??? gabapentin (NEURONTIN) 100 mg capsule Take 600 mg by mouth three (3) times daily.   ??? spironolactone (ALDACTONE) 100 mg tablet TAKE 1 TABLET BY MOUTH DAILY   ??? polyethylene glycol (MIRALAX) 17 gram packet Take 1 Packet by mouth daily.   ??? triamcinolone (NASACORT) 55 mcg nasal inhaler 2 Sprays by Both Nostrils route daily.   ??? Blood-Glucose Meter monitoring kit Check sugars 2 times per day, fasting and 2 hours after dinner   ??? Lancets misc Check sugars twice daily   ??? glucose blood VI test strips (ASCENSIA AUTODISC VI, ONE TOUCH ULTRA TEST VI) strip Check sugars twice daily     No current facility-administered medications for this visit.        There are no discontinued medications.    [facility] follow up appointment(s):   Future Appointments   Date Time Provider [place]   11/29/2017 To Be Determined Judd Gaudier [place]   12/02/2017 To Be Determined Goling, Chit Chit F, PT [facility]      Non-[facility] follow up appointment(s): no  [facility]:  n/a       Goals     ??? Patient verbalizes understanding of self -management goals of living with Diabetes.      Patient will begin checking her own blood sugars  Patient will follow diabetic diet  Patient will agree to get diabetic teaching.

## 2017-11-28 NOTE — Progress Notes (Signed)
 Hospital Discharge Follow-Up      Date/Time:  11/28/2017 2:04 PM    Patient was admitted to [facility] on 6/4 and discharged on 6/8 for COPD exacerbation. The physician discharge summary was available at the time of outreach.  Patient was contacted within 2 business days of discharge.      Top Challenges reviewed with the provider   Patient is noncompliant with her diabetes         Method of communication with provider :chart routing    Inpatient RRAT score: 44  Was this a readmission? no   Patient stated reason for the readmission: NA    Nurse Navigator (NN) contacted the patient by telephone to perform post hospital discharge assessment. Verified name and DOB with patient as identifiers. Provided introduction to self, and explanation of the Nurse Navigator role.     Reviewed discharge instructions and red flags with patient who verbalized understanding. Patient given an opportunity to ask questions and does not have any further questions or concerns at this time. The patient agrees to contact the PCP office for questions related to their healthcare. NN provided contact information for future reference.    Disease Specific:   COPD    Summary of patient's top problems:  1. Diabetic aherence  2. Advanced COPD  3.     Home Health orders at discharge: PT, SN  Home Health company: [facility]  Date of initial visit: unable to determine    Durable Medical Equipment ordered/company: [employer] mail order  Durable Medical Equipment received: Blood glucose maching and all supplies.    Barriers to care? financial, lack of knowledge about disease, level of motivation    Advance Care Planning:   Does patient have an Advance Directive:  Says on file, unable to find in system     Medication(s):   New Medications at Discharge: albuterol -ipratropium  Changed Medications at Discharge: metformin  Discontinued Medications at Discharge: NA    Medication reconciliation was performed with patient, who verbalizes  understanding of administration of home medications.  There were no barriers to obtaining medications identified at this time.    Patient is not compliant with checking blood sugars and adhering to DM diet, refused teaching.    Referral to Pharm D needed: no     Current Outpatient Medications   Medication Sig   . amLODIPine  (NORVASC ) 10 mg tablet Take  by mouth daily.   . nabumetone (RELAFEN) 750 mg tablet Take 750 mg by mouth two (2) times a day.   . albuterol -ipratropium (DUO-NEB) 2.5 mg-0.5 mg/3 ml nebu 3 mL by Nebulization route.   . albuterol  (PROVENTIL  HFA, VENTOLIN  HFA, PROAIR  HFA) 90 mcg/actuation inhaler Take 2 Puffs by inhalation every six (6) hours for 5 days, THEN 2 Puffs every four (4) hours as needed for Wheezing or Shortness of Breath for up to 30 days.   . metFORMIN ER (GLUCOPHAGE XR) 750 mg tablet Take 1 Tab by mouth two (2) times a day.   . montelukast  (SINGULAIR ) 10 mg tablet TAKE 1 TABLET BY MOUTH DAILY   . magnesium  oxide (MAG-OX) 400 mg tablet TAKE 1 TABLET BY MOUTH DAILY   . traZODone (DESYREL) 50 mg tablet Take 1 Tab by mouth nightly as needed for Sleep.   . losartan  (COZAAR ) 100 mg tablet TAKE 1 TABLET BY MOUTH DAILY FOR HYPERTENSION   . QUEtiapine  SR (SEROQUEL  XR) 150 mg sr tablet TAKE 1 TABLET BY MOUTH NIGHTLY   . rosuvastatin  (CRESTOR ) 5  mg tablet TAKE 1 TABLET BY MOUTH NIGHTLY   . albuterol  (ACCUNEB ) 1.25 mg/3 mL nebu 3 mL by Nebulization route every six (6) hours as needed.   . Blood Glucose Control High&Low (ACCU-CHEK AVIVA CONTROL SOLN) soln Check blood sugar daily   . Blood-Glucose Meter (ACCU-CHEK AVIVA PLUS METER) misc Check blood sugar daily   . glucose blood VI test strips (ACCU-CHEK AVIVA PLUS TEST STRP) strip Check blood sugar daily   . alcohol swabs (BD SINGLE USE SWABS REGULAR) padm Check blood sugar daily   . lancets (ACCU-CHEK SOFTCLIX LANCETS) misc Check blood sugar daily   . dilTIAZem CD (CARDIZEM CD) 180 mg ER capsule Take 1 Cap by mouth daily.   . predniSONE (DELTASONE)  10 mg tablet Take 10 mg by mouth daily (with breakfast). Take 3 PO daily for 4 days,   Then 2 PO daily for 4 days  Then 1 PO daily for 4 days,   Then DC   . azithromycin (ZITHROMAX) 500 mg tab Take 1 Tab by mouth daily.   SABRA tiotropium bromide (SPIRIVA RESPIMAT) 2.5 mcg/actuation inhaler Take 1 Puff by inhalation daily.   . cloNIDine HCl (CATAPRES) 0.3 mg tablet Take bid (Patient taking differently: Take 1 Tab by mouth two (2) times a day. Take bid)   . fluticasone  propion-salmeterol (ADVAIR DISKUS) 100-50 mcg/dose diskus inhaler Take 1 Puff by inhalation two (2) times a day.   . gabapentin (NEURONTIN) 100 mg capsule Take 600 mg by mouth three (3) times daily.   . spironolactone  (ALDACTONE ) 100 mg tablet TAKE 1 TABLET BY MOUTH DAILY   . polyethylene glycol (MIRALAX ) 17 gram packet Take 1 Packet by mouth daily.   SABRA triamcinolone (NASACORT) 55 mcg nasal inhaler 2 Sprays by Both Nostrils route daily.   . Blood-Glucose Meter monitoring kit Check sugars 2 times per day, fasting and 2 hours after dinner   . Lancets misc Check sugars twice daily   . glucose blood VI test strips (ASCENSIA AUTODISC VI, ONE TOUCH ULTRA TEST VI) strip Check sugars twice daily     No current facility-administered medications for this visit.        There are no discontinued medications.    [facility] follow up appointment(s):   Future Appointments   Date Time Provider Department Center   11/29/2017 To Be Determined Colletta Rexene JINNY JOSETTA [facility]   12/02/2017 To Be Determined Goling, Chit Chit F, PT [facility]      Non-[facility] follow up appointment(s): no  [facility]:  n/a       Goals     . Patient verbalizes understanding of self -management goals of living with Diabetes.      Patient will begin checking her own blood sugars  Patient will follow diabetic diet  Patient will agree to get diabetic teaching.

## 2017-11-29 ENCOUNTER — Encounter: Primary: Physician Assistant

## 2017-11-29 NOTE — Telephone Encounter (Signed)
Per Rosine's request, called pt's husband to discuss medication and find out how she is taking Metformin. He states that last time it was filled at mail order pharmacy, it was the 500mg  instead of the 750mg , she has been taking one tablet twice daily. Per Rosine, pt needs to take two tablets twice daily and add insulin, 20 units of Lantus nightly. He voiced understanding and is concerned about the insulin, they were trying to avoid that. He will talk it over with pt later today. He was advised that they can come in to the office and get the nurse to show then how to inject insulin, he appreciates this and will likely do so. A1c should be checked in about one month.

## 2017-11-29 NOTE — Telephone Encounter (Signed)
Nursing staff from [facility] agency.    Nurse is calling to report that the patients fasting blood sugar was 434. This glucose reading was taken at 9:41am.

## 2017-11-30 ENCOUNTER — Encounter: Primary: Physician Assistant

## 2017-12-02 ENCOUNTER — Encounter: Primary: Physician Assistant

## 2017-12-04 NOTE — Telephone Encounter (Signed)
Does she really take SIX capsules three times a day?

## 2017-12-06 NOTE — Telephone Encounter (Signed)
Patient significant other states that she takes 1 tablet of the gabapentin 3 times a day and the albuterol is the solution for the nebulizer.

## 2017-12-07 MED ORDER — GABAPENTIN 100 MG CAP
100 mg | ORAL_CAPSULE | Freq: Three times a day (TID) | ORAL | 2 refills | Status: DC
Start: 2017-12-07 — End: 2018-01-03

## 2017-12-07 MED ORDER — INSULIN GLARGINE 100 UNIT/ML (3 ML) SUB-Q PEN
100 unit/mL (3 mL) | SUBCUTANEOUS | 0 refills | Status: DC
Start: 2017-12-07 — End: 2017-12-17

## 2017-12-07 MED ORDER — ALBUTEROL SULFATE 1.25 MG/3 ML (0.042 %) SOLN FOR INHALATION
1.25 mg/3 mL | Freq: Four times a day (QID) | RESPIRATORY_TRACT | 0 refills | Status: DC | PRN
Start: 2017-12-07 — End: 2018-03-24

## 2017-12-07 NOTE — Telephone Encounter (Signed)
[facility], Mrs. Owen's blood sugar today is 434. I advised that we spoke with husband last week and pt was to increase her metformin to 500 mg 2 tablets twice daily, she confirmed they have done that. She was also supposed to start on Lantus, the husband was going to talk to pt about it and let us know what was decided. She said they will do it, will send to pharmacy today, inject 20 units nightly. Rose spoke with husband while on the phone with me and advised him that she can come out to the house to show them how to do the injections.

## 2017-12-08 MED ORDER — PEN NEEDLE, DIABETIC 32 GAUGE X 5/32"
32 gauge x 5/" | PEN_INJECTOR | 0 refills | Status: DC
Start: 2017-12-08 — End: 2018-01-02

## 2017-12-08 NOTE — Telephone Encounter (Signed)
Pt's husband made aware to check blood sugars TID and follow up next week

## 2017-12-12 ENCOUNTER — Inpatient Hospital Stay
Admit: 2017-12-12 | Discharge: 2017-12-13 | Disposition: A | Discharge status: Home / Self Care | Payer: MEDICARE | Attending: Emergency Medicine

## 2017-12-12 ENCOUNTER — Emergency Department: Admit: 2017-12-12 | Payer: MEDICARE | Primary: Physician Assistant

## 2017-12-12 DIAGNOSIS — E1165 Type 2 diabetes mellitus with hyperglycemia: Secondary | ICD-10-CM

## 2017-12-12 LAB — CBC WITH AUTO DIFFERENTIAL
Basophils %: 0.2 % (ref 0–3)
Eosinophils %: 0.4 % (ref 0–5)
Hematocrit: 44.4 % (ref 37.0–50.0)
Hemoglobin: 14.7 gm/dl (ref 13.0–17.2)
Immature Granulocytes: 0.2 % (ref 0.0–3.0)
Lymphocytes %: 26.7 % — ABNORMAL LOW (ref 28–48)
MCH: 28.8 pg (ref 25.4–34.6)
MCHC: 33.1 gm/dl (ref 30.0–36.0)
MCV: 86.9 fL (ref 80.0–98.0)
MPV: 12.1 fL — ABNORMAL HIGH (ref 6.0–10.0)
Monocytes %: 7.6 % (ref 1–13)
Neutrophils %: 64.9 % — ABNORMAL HIGH (ref 34–64)
Nucleated RBCs: 0 (ref 0–0)
Platelets: 299 10*3/uL (ref 140–450)
RBC: 5.11 M/uL (ref 3.60–5.20)
RDW-SD: 42.2 (ref 36.4–46.3)
WBC: 9.6 10*3/uL (ref 4.0–11.0)

## 2017-12-12 LAB — COMPREHENSIVE METABOLIC PANEL
ALT: 16 U/L (ref 12–78)
AST: 7 U/L — ABNORMAL LOW (ref 15–37)
Albumin: 3.9 gm/dl (ref 3.4–5.0)
Alkaline Phosphatase: 83 U/L (ref 45–117)
Anion Gap: 8 mmol/L (ref 5–15)
BUN: 28 mg/dl — ABNORMAL HIGH (ref 7–25)
CO2: 27 mEq/L (ref 21–32)
Calcium: 9.8 mg/dl (ref 8.5–10.1)
Chloride: 100 mEq/L (ref 98–107)
Creatinine: 1.7 mg/dl — ABNORMAL HIGH (ref 0.6–1.3)
EGFR IF NonAfrican American: 32
GFR African American: 39
Glucose: 323 mg/dl — ABNORMAL HIGH (ref 60–100)
Potassium: 4.6 mEq/L (ref 3.5–5.1)
Sodium: 134 mEq/L — ABNORMAL LOW (ref 136–145)
Total Bilirubin: 0.6 mg/dl (ref 0.2–1.0)
Total Protein: 8.5 gm/dl — ABNORMAL HIGH (ref 6.4–8.2)

## 2017-12-12 LAB — LACTIC ACID
LACTIC ACID: 1.7 mmol/L (ref 0.4–2.0)
Lactic Acid: 1.7 mmol/L (ref 0.4–2.0)

## 2017-12-12 LAB — URINALYSIS W/ RFLX MICROSCOPIC
Bilirubin, Urine: NEGATIVE
Bilirubin: NEGATIVE
Glucose, Ur: 500 mg/dl — AB
Glucose: 500 mg/dl — AB
Ketone: 15 mg/dl — AB
Ketones, Urine: 15 mg/dl — AB
Nitrite, Urine: POSITIVE — AB
Nitrites: POSITIVE — AB
Specific Gravity, UA: 1.015 (ref 1.005–1.030)
Specific gravity: 1.015 (ref 1.005–1.030)
Urobilinogen, UA, POCT: 0.2 mg/dl (ref 0.0–1.0)
Urobilinogen: 0.2 mg/dl (ref 0.0–1.0)
pH (UA): 6.5 (ref 5.0–9.0)
pH, UA: 6.5 (ref 5.0–9.0)

## 2017-12-12 LAB — PROBNP, N-TERMINAL: BNP: 28.9 pg/ml (ref 0.0–125.0)

## 2017-12-12 LAB — POCT GLUCOSE
POC Glucose: 334 mg/dL — ABNORMAL HIGH (ref 65–105)
POC Glucose: 285 mg/dL — ABNORMAL HIGH (ref 65–105)

## 2017-12-12 LAB — AMB POC CREATININE: Creatinine: 1.1 mg/dl (ref 0.6–1.3)

## 2017-12-12 LAB — TROPONIN: Troponin I: <0.015 ng/ml (ref 0.00–0.09)

## 2017-12-12 LAB — LIPASE
Lipase: 110 U/L (ref 73–393)
Lipase: 110 U/L (ref 73–393)

## 2017-12-12 LAB — CBC WITH AUTOMATED DIFF
BASOPHILS: 0.2 % (ref 0–3)
EOSINOPHILS: 0.4 % (ref 0–5)
HCT: 44.4 % (ref 37.0–50.0)
HGB: 14.7 gm/dl (ref 13.0–17.2)
IMMATURE GRANULOCYTES: 0.2 % (ref 0.0–3.0)
LYMPHOCYTES: 26.7 % — ABNORMAL LOW (ref 28–48)
MCH: 28.8 pg (ref 25.4–34.6)
MCHC: 33.1 gm/dl (ref 30.0–36.0)
MCV: 86.9 fL (ref 80.0–98.0)
MONOCYTES: 7.6 % (ref 1–13)
MPV: 12.1 fL — ABNORMAL HIGH (ref 6.0–10.0)
NEUTROPHILS: 64.9 % — ABNORMAL HIGH (ref 34–64)
NRBC: 0 (ref 0–0)
PLATELET: 299 10*3/uL (ref 140–450)
RBC: 5.11 M/uL (ref 3.60–5.20)
RDW-SD: 42.2 (ref 36.4–46.3)
WBC: 9.6 10*3/uL (ref 4.0–11.0)

## 2017-12-12 LAB — METABOLIC PANEL, COMPREHENSIVE
ALT (SGPT): 16 U/L (ref 12–78)
AST (SGOT): 7 U/L — ABNORMAL LOW (ref 15–37)
Albumin: 3.9 gm/dl (ref 3.4–5.0)
Alk. phosphatase: 83 U/L (ref 45–117)
Anion gap: 8 mmol/L (ref 5–15)
BUN: 28 mg/dl — ABNORMAL HIGH (ref 7–25)
Bilirubin, total: 0.6 mg/dl (ref 0.2–1.0)
CO2: 27 mEq/L (ref 21–32)
Calcium: 9.8 mg/dl (ref 8.5–10.1)
Chloride: 100 mEq/L (ref 98–107)
Creatinine: 1.7 mg/dl — ABNORMAL HIGH (ref 0.6–1.3)
GFR est AA: 39
GFR est non-AA: 32
Glucose: 323 mg/dl — ABNORMAL HIGH (ref 60–100)
Potassium: 4.6 mEq/L (ref 3.5–5.1)
Protein, total: 8.5 gm/dl — ABNORMAL HIGH (ref 6.4–8.2)
Sodium: 134 mEq/L — ABNORMAL LOW (ref 136–145)

## 2017-12-12 LAB — GLUCOSE, POC
Glucose (POC): 334 mg/dL — ABNORMAL HIGH (ref 65–105)
Glucose (POC): 285 mg/dL — ABNORMAL HIGH (ref 65–105)

## 2017-12-12 LAB — CREATININE, POC: Creatinine: 1.1 mg/dl (ref 0.6–1.3)

## 2017-12-12 LAB — TROPONIN I: Troponin-I: <0.015 ng/ml (ref 0.00–0.09)

## 2017-12-12 LAB — NT-PRO BNP: NT pro-BNP: 28.9 pg/ml (ref 0.0–125.0)

## 2017-12-12 MED ORDER — SODIUM CHLORIDE 0.9% BOLUS IV
0.9 % | INTRAVENOUS | Status: AC
Start: 2017-12-12 — End: 2017-12-12
  Administered 2017-12-12: 22:00:00 via INTRAVENOUS

## 2017-12-12 MED ORDER — SODIUM CHLORIDE 0.9 % IJ SYRG
Freq: Once | INTRAMUSCULAR | Status: AC
Start: 2017-12-12 — End: 2017-12-12
  Administered 2017-12-12: 22:00:00 via INTRAVENOUS

## 2017-12-12 MED ORDER — INSULIN LISPRO 100 UNIT/ML INJECTION
100 unit/mL | SUBCUTANEOUS | Status: DC | PRN
Start: 2017-12-12 — End: 2017-12-13

## 2017-12-12 MED ORDER — IPRATROPIUM-ALBUTEROL 2.5 MG-0.5 MG/3 ML NEB SOLUTION
2.5 mg-0.5 mg/3 ml | RESPIRATORY_TRACT | Status: AC
Start: 2017-12-12 — End: 2017-12-12

## 2017-12-12 MED ORDER — SODIUM CHLORIDE 0.9% BOLUS IV
0.9 % | INTRAVENOUS | Status: AC
Start: 2017-12-12 — End: 2017-12-12
  Administered 2017-12-12: 20:00:00 via INTRAVENOUS

## 2017-12-12 MED ORDER — INSULIN LISPRO 100 UNIT/ML INJECTION
100 unit/mL | Freq: Four times a day (QID) | SUBCUTANEOUS | Status: DC
Start: 2017-12-12 — End: 2017-12-13

## 2017-12-12 MED ORDER — GLUCAGON 1 MG INJECTION
1 mg | INTRAMUSCULAR | Status: DC | PRN
Start: 2017-12-12 — End: 2017-12-13

## 2017-12-12 MED ORDER — IPRATROPIUM-ALBUTEROL 2.5 MG-0.5 MG/3 ML NEB SOLUTION
2.5 mg-0.5 mg/3 ml | RESPIRATORY_TRACT | Status: AC
Start: 2017-12-12 — End: 2017-12-12
  Administered 2017-12-12: 19:00:00 via RESPIRATORY_TRACT

## 2017-12-12 MED ORDER — IOPAMIDOL 76 % IV SOLN
370 mg iodine /mL (76 %) | Freq: Once | INTRAVENOUS | Status: AC
Start: 2017-12-12 — End: 2017-12-12
  Administered 2017-12-13: via INTRAVENOUS

## 2017-12-12 MED ORDER — DEXAMETHASONE SODIUM PHOSPHATE 10 MG/ML IJ SOLN
10 mg/mL | Freq: Once | INTRAMUSCULAR | Status: AC
Start: 2017-12-12 — End: 2017-12-12
  Administered 2017-12-12: 20:00:00 via INTRAVENOUS

## 2017-12-12 MED ORDER — ALBUTEROL SULFATE 0.083 % (0.83 MG/ML) SOLN FOR INHALATION
2.5 mg /3 mL (0.083 %) | RESPIRATORY_TRACT | Status: AC
Start: 2017-12-12 — End: 2017-12-12
  Administered 2017-12-12: 19:00:00

## 2017-12-12 MED ORDER — DEXTROSE 50% IN WATER (D50W) IV SYRG
INTRAVENOUS | Status: DC | PRN
Start: 2017-12-12 — End: 2017-12-13

## 2017-12-12 MED ORDER — INSULIN GLARGINE 100 UNIT/ML INJECTION
100 unit/mL | Freq: Every evening | SUBCUTANEOUS | Status: DC
Start: 2017-12-12 — End: 2017-12-13

## 2017-12-12 MED FILL — ISOVUE-370  76 % INTRAVENOUS SOLUTION: 370 mg iodine /mL (76 %) | INTRAVENOUS | Qty: 80

## 2017-12-12 MED FILL — ALBUTEROL SULFATE 0.083 % (0.83 MG/ML) SOLN FOR INHALATION: 2.5 mg /3 mL (0.083 %) | RESPIRATORY_TRACT | Qty: 2

## 2017-12-12 MED FILL — IPRATROPIUM-ALBUTEROL 2.5 MG-0.5 MG/3 ML NEB SOLUTION: 2.5 mg-0.5 mg/3 ml | RESPIRATORY_TRACT | Qty: 3

## 2017-12-12 MED FILL — DEXAMETHASONE SODIUM PHOSPHATE 10 MG/ML IJ SOLN: 10 mg/mL | INTRAMUSCULAR | Qty: 1

## 2017-12-12 NOTE — Progress Notes (Signed)
prelim code r note  ??  Last admission date  11/18/17    reason for last admission    COPD exacerbation    MD Dr Verma  ??  disposition at discharge   home  ??  Insurance   [employer]  ??

## 2017-12-12 NOTE — ED Notes (Signed)
9:58 PM  12/12/17     Discharge instructions given to pt (name) with verbalization of understanding. Patient accompanied by .  Patient discharged with the following prescriptionsPatient discharged to home (destination).      Melrico Morido, RN

## 2017-12-12 NOTE — ED Notes (Signed)
10:38 PM  12/12/17     Discharge instructions given to pt (name) with verbalization of understanding. Patient accompanied by .  Patient discharged with the following prescriptionsPatient discharged to home (destination).      Melrico Morido, RN

## 2017-12-12 NOTE — ED Provider Notes (Addendum)
[place]  Emergency Department Treatment Report    Patient: [name] [name] Age: 67 y.o. Sex: female    Date of Birth: 06-27-1950 Admit Date: 12/12/2017 PCP: Ernst Breach   MRN: [id number]  CSN: [id number]  Attending:  Joice Lofts, MD   Room: ER20/ER20 Time Dictated: 3:20 PM APP:  Johnella Moloney       Chief Complaint   Chief Complaint   Patient presents with   ??? High Blood Sugar   ??? Hip Pain     History of Present Illness   67 y.o. female with multiple medical problems including insulin-dependent diabetes, COPD, marijuana use and chronic pain syndrome presents to the ED for a couple of days of worsening shortness of breath and elevated blood sugars.  History has been obtained primarily through husband.  Husband says patient's blood pressures have been below 637 systolic.  Husband endorses that patient has had chills.  Patient has been denies she has had fevers, chest pain, abdominal pain, vomiting.  Did have some constipation and a couple of episodes of diarrhea.  Denies urinary symptoms, pain or swelling to legs or recent surgery, history of blood clots.  Has been trying albuterol inhalers at home without relief of symptoms.    Review of Systems   Constitutional:  No fever  Eyes: No eye redness or drainage.  ENT: No sore throat, runny nose or ear pain.  Respiratory: See HPI  Cardiovascular: No chest pain, pressure, palpitations, tightness or heaviness.  Gastrointestinal: No vomiting or abdominal pain  Genitourinary: No dysuria, frequency, or urgency.  Musculoskeletal: No joint pain or swelling  Integumentary: No rashes  Neurological: No headaches, focal sensory or motor symptoms.  Denies complaints in all other systems    Past Medical/Surgical History     Past Medical History:   Diagnosis Date   ??? Arthritis    ??? 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 COPD (chronic obstructive pulmonary disease) ([place])    ??? DM2 (diabetes mellitus, type 2) ([place])     ??? GERD (gastroesophageal reflux disease)    ??? Hepatitis C     HEP C   ??? HTN (hypertension)    ??? Lung nodule, multiple     (CT 10/2016) Small left upper lobe and 1.7 x 1.6 cm left lower lobe nodules not significantly changed from 07/23/2016 and 03/22/2016   ??? Marijuana use    ??? PTSD (post-traumatic stress disorder)     lived through the [place] in 1993   ??? Thoracic ascending aortic aneurysm ([place])     4.4cm noted on CT Chest (10/2016)   ??? Thyroid nodule     2.5 cm stable right thyroid nodule (CT 10/2016)     Past Surgical History:   Procedure Laterality Date   ??? HX ENDOSCOPY     ??? HX HIP REPLACEMENT Right 01/29/2015   ??? HX HIP REPLACEMENT     ??? HX HYSTERECTOMY  2010    hysterectomy       Social History     Social History     Socioeconomic History   ??? Marital status: MARRIED     Spouse name: Not on file   ??? Number of children: Not on file   ??? Years of education: Not on file   ??? Highest education level: Not on file   Tobacco Use   ??? Smoking status: Former Smoker   ??? Smokeless tobacco: Never  Used   ??? Tobacco comment: reported as quit smoking around 1990s per spouse   Substance and Sexual Activity   ??? Alcohol use: No   ??? Drug use: Yes     Types: Marijuana   ??? Sexual activity: Never       Family History     Family History   Problem Relation Age of Onset   ??? Hypertension Mother    ??? Other Mother         OSA   ??? Cancer Father         suspected leukemia per pt's spouse   ??? Cancer Maternal Aunt    ??? Alcohol abuse Maternal Grandfather        Home Medications     Prior to Admission Medications   Prescriptions Last Dose Informant Patient Reported? Taking?   Blood Glucose Control High&Low (ACCU-CHEK AVIVA CONTROL SOLN) soln   No No   Sig: Check blood sugar daily   Blood-Glucose Meter (ACCU-CHEK AVIVA PLUS METER) misc   No No   Sig: Check blood sugar daily   Blood-Glucose Meter monitoring kit   No No   Sig: Check sugars 2 times per day, fasting and 2 hours after dinner    Insulin Needles, Disposable, (NANO PEN NEEDLE) 32 gauge x 5/32" ndle   No No   Sig: To use with Lantus pen   Lancets misc   No No   Sig: Check sugars twice daily   QUEtiapine SR (SEROQUEL XR) 150 mg sr tablet   No No   Sig: TAKE 1 TABLET BY MOUTH NIGHTLY   albuterol (ACCUNEB) 1.25 mg/3 mL nebu   No No   Sig: 3 mL by Nebulization route every six (6) hours as needed (SOB, wheezing). Indications: Asthma Attack   albuterol (PROVENTIL HFA, VENTOLIN HFA, PROAIR HFA) 90 mcg/actuation inhaler   No No   Sig: Take 2 Puffs by inhalation every six (6) hours for 5 days, THEN 2 Puffs every four (4) hours as needed for Wheezing or Shortness of Breath for up to 30 days.   albuterol-ipratropium (DUO-NEB) 2.5 mg-0.5 mg/3 ml nebu   Yes No   Sig: 3 mL by Nebulization route.   alcohol swabs (BD SINGLE USE SWABS REGULAR) padm   No No   Sig: Check blood sugar daily   amLODIPine (NORVASC) 10 mg tablet   Yes No   Sig: Take  by mouth daily.   azithromycin (ZITHROMAX) 500 mg tab   No No   Sig: Take 1 Tab by mouth daily.   cloNIDine HCl (CATAPRES) 0.3 mg tablet   No No   Sig: Take bid   Patient taking differently: Take 1 Tab by mouth two (2) times a day. Take bid   dilTIAZem CD (CARDIZEM CD) 180 mg ER capsule   No No   Sig: Take 1 Cap by mouth daily.   fluticasone propion-salmeterol (ADVAIR DISKUS) 100-50 mcg/dose diskus inhaler   No No   Sig: Take 1 Puff by inhalation two (2) times a day.   gabapentin (NEURONTIN) 100 mg capsule   No No   Sig: Take 1 Cap by mouth three (3) times daily.   glucose blood VI test strips (ACCU-CHEK AVIVA PLUS TEST STRP) strip   No No   Sig: Check blood sugar daily   glucose blood VI test strips (ASCENSIA AUTODISC VI, ONE TOUCH ULTRA TEST VI) strip   No No   Sig: Check sugars twice daily   insulin glargine (LANTUS,BASAGLAR)  100 unit/mL (3 mL) inpn   No No   Sig: Inject 20 units nightly   lancets (ACCU-CHEK SOFTCLIX LANCETS) misc   No No   Sig: Check blood sugar daily   losartan (COZAAR) 100 mg tablet   No No    Sig: TAKE 1 TABLET BY MOUTH DAILY FOR HYPERTENSION   magnesium oxide (MAG-OX) 400 mg tablet   No No   Sig: TAKE 1 TABLET BY MOUTH DAILY   metFORMIN ER (GLUCOPHAGE XR) 750 mg tablet   No No   Sig: Take 1 Tab by mouth two (2) times a day.   montelukast (SINGULAIR) 10 mg tablet   No No   Sig: TAKE 1 TABLET BY MOUTH DAILY   nabumetone (RELAFEN) 750 mg tablet   Yes No   Sig: Take 750 mg by mouth two (2) times a day.   polyethylene glycol (MIRALAX) 17 gram packet   No No   Sig: Take 1 Packet by mouth daily.   predniSONE (DELTASONE) 10 mg tablet   No No   Sig: Take 10 mg by mouth daily (with breakfast). Take 3 PO daily for 4 days,   Then 2 PO daily for 4 days  Then 1 PO daily for 4 days,   Then DC   rosuvastatin (CRESTOR) 5 mg tablet   No No   Sig: TAKE 1 TABLET BY MOUTH NIGHTLY   spironolactone (ALDACTONE) 100 mg tablet   No No   Sig: TAKE 1 TABLET BY MOUTH DAILY   tiotropium bromide (SPIRIVA RESPIMAT) 2.5 mcg/actuation inhaler   No No   Sig: Take 1 Puff by inhalation daily.   traZODone (DESYREL) 50 mg tablet   No No   Sig: Take 1 Tab by mouth nightly as needed for Sleep.   triamcinolone (NASACORT) 55 mcg nasal inhaler   No No   Sig: 2 Sprays by Both Nostrils route daily.      Facility-Administered Medications: None       Allergies     Allergies   Allergen Reactions   ??? Chocolate [Cocoa] Sneezing       Physical Exam     ED Triage Vitals [12/12/17 1201]   ED Encounter Vitals Group      BP 117/85      Pulse (Heart Rate) (!) 125      Resp Rate 20      Temp 98.8 ??F (37.1 ??C)      Temp src       O2 Sat (%) 99 %      Weight 179 lb 14.3 oz      Height      Constitutional: Patient appears uncomfortable and is tachycardic.  Smells of urine.  HEENT: Conjunctiva clear.  PERRLA. Mucous membranes moist, non-erythematous. Surface of the pharynx, palate, and tongue are pink, moist and without lesions.  Neck: Symmetrical, no masses. Normal range of motion.  Respiratory: Lungs with inspiratory wheezing throughout.   Cardiovascular: Heart tachycardic but with regular rate and rhythm without murmur rubs or gallops. Calves soft and non-tender.  No peripheral edema or significant variscosities.    Gastrointestinal:  Abdomen soft, nontender without complaint of pain to palpation.  Musculoskeletal: No deformities of the limbs.  Integumentary: Warm and dry without rashes or lesions  Neurologic: Alert and moving all limbs spontaneously.    Impression and Management Plan   67 year old female with multiple admissions to the hospital who has COPD and diabetes and chronic pain syndrome presents with difficulty breathing.  She is tachycardic at 125 bpm.  Husband reports that she is had some chills.  She also had difficult to control blood sugar.  Patient smells of urine.    Have initiated sepsis labs with troponin and proBNP, EKG.  Will place patient on monitor.  Will give IV fluids, breathing treatment and steroids.  Will place on Glucomander.  Patient also has a known thoracic aortic aneurysm.  Have added a d-dimer given tachycardia and shortness of breath.  Will likely CTA patient's chest.  No belly pain today.  Will get stool studies for intermittent diarrhea over the last couple of days.    Patient with history of chronic pain syndrome, hesitate to give any opioid medications.  Diagnostic Studies     EKG: Sinus tachycardia with a rate of 112 bpm.  No acute ischemia as read by Dr. Veneta Penton.    Lab:   Recent Results (from the past 24 hour(s))   GLUCOSE, POC    Collection Time: 12/12/17 12:07 PM   Result Value Ref Range    Glucose (POC) 334 (H) 65 - 105 mg/dL   CBC WITH AUTOMATED DIFF    Collection Time: 12/12/17  4:10 PM   Result Value Ref Range    WBC 9.6 4.0 - 11.0 1000/mm3    RBC 5.11 3.60 - 5.20 M/uL    HGB 14.7 13.0 - 17.2 gm/dl    HCT 44.4 37.0 - 50.0 %    MCV 86.9 80.0 - 98.0 fL    MCH 28.8 25.4 - 34.6 pg    MCHC 33.1 30.0 - 36.0 gm/dl    PLATELET 299 140 - 450 1000/mm3    MPV 12.1 (H) 6.0 - 10.0 fL    RDW-SD 42.2 36.4 - 46.3       NRBC 0 0 - 0      IMMATURE GRANULOCYTES 0.2 0.0 - 3.0 %    NEUTROPHILS 64.9 (H) 34 - 64 %    LYMPHOCYTES 26.7 (L) 28 - 48 %    MONOCYTES 7.6 1 - 13 %    EOSINOPHILS 0.4 0 - 5 %    BASOPHILS 0.2 0 - 3 %   METABOLIC PANEL, COMPREHENSIVE    Collection Time: 12/12/17  4:10 PM   Result Value Ref Range    Potassium 4.6 3.5 - 5.1 mEq/L    Chloride 100 98 - 107 mEq/L    Sodium 134 (L) 136 - 145 mEq/L    CO2 27 21 - 32 mEq/L    Glucose 323 (H) 60 - 100 mg/dl    BUN 28 (H) 7 - 25 mg/dl    Creatinine 1.7 (H) 0.6 - 1.3 mg/dl    GFR est AA 39.0      GFR est non-AA 32      Calcium 9.8 8.5 - 10.1 mg/dl    AST (SGOT) 7 (L) 15 - 37 U/L    ALT (SGPT) 16 12 - 78 U/L    Alk. phosphatase 83 45 - 117 U/L    Bilirubin, total 0.6 0.2 - 1.0 mg/dl    Protein, total 8.5 (H) 6.4 - 8.2 gm/dl    Albumin 3.9 3.4 - 5.0 gm/dl    Anion gap 8 5 - 15 mmol/L   TROPONIN I    Collection Time: 12/12/17  4:10 PM   Result Value Ref Range    Troponin-I <0.015 0.00 - 0.09 ng/ml   NT-PRO BNP    Collection Time: 12/12/17  4:10 PM   Result Value  Ref Range    NT pro-BNP 28.9 0.0 - 125.0 pg/ml   LACTIC ACID    Collection Time: 12/12/17  4:10 PM   Result Value Ref Range    Lactic Acid 1.7 0.4 - 2.0 mmol/L   LIPASE    Collection Time: 12/12/17  4:10 PM   Result Value Ref Range    Lipase 110 73 - 393 U/L   URINALYSIS W/ RFLX MICROSCOPIC    Collection Time: 12/12/17  5:50 PM   Result Value Ref Range    Color YELLOW YELLOW,STRAW      Appearance SLIGHTLY CLOUDY (A) CLEAR      Glucose 500 (A) NEGATIVE,Negative mg/dl    Bilirubin NEGATIVE NEGATIVE,Negative      Ketone 15 (A) NEGATIVE,Negative mg/dl    Specific gravity 1.015 1.005 - 1.030      Blood SMALL (A) NEGATIVE,Negative      pH (UA) 6.5 5.0 - 9.0      Protein TRACE (A) NEGATIVE,Negative mg/dl    Urobilinogen 0.2 0.0 - 1.0 mg/dl    Nitrites POSITIVE (A) NEGATIVE,Negative      Leukocyte Esterase MODERATE (A) NEGATIVE,Negative     POC URINE MICROSCOPIC    Collection Time: 12/12/17  5:50 PM   Result Value Ref Range     Epithelial cells, squamous 30-49 /LPF    WBC 30-49 /HPF    RBC OCCASIONAL /HPF    Bacteria OCCASIONAL /HPF   GLUCOSE, POC    Collection Time: 12/12/17  6:00 PM   Result Value Ref Range    Glucose (POC) 285 (H) 65 - 105 mg/dL   POC CREATININE    Collection Time: 12/12/17  7:32 PM   Result Value Ref Range    Creatinine NO VALUE 0.6 - 1.3 mg/dl   POC CREATININE    Collection Time: 12/12/17  7:39 PM   Result Value Ref Range    Creatinine 1.1 0.6 - 1.3 mg/dl       My preliminary x-ray interpretation: Chest x-ray shows no acute cardiopulmonary disease    Imaging:    Xr Chest Pa Lat    Result Date: 12/12/2017  Chest Indication: SOB and tachycardia.    Comparison: X-ray 11/14/2017, CT 10/09/2017 Findings: Frontal and lateral views of the chest demonstrate that the cardiac silhouette is not enlarged.  Stable 1.8 cm left lower lobe mass. Left basilar atelectasis. Stable bullous changes. No new infiltrate.         Impression: No significant change. No new infiltrate.     Cta Chest W Or W Wo Cont    Result Date: 12/12/2017  CTA OF THE CHEST WITH INTRAVENOUS CONTRAST INDICATION: dyspnea and tachycardia.    COMPARISON: 10/09/2017 TECHNIQUE: CTA of the chest was performed following nonionic intravenous contrast administration. Multiplanar 3-dimensional maximum intensity projection reconstructions were performed and reviewed. DICOM format image data is available to non-affiliated external healthcare facilities or entities on a secure, media free, reciprocally searchable basis with patient authorization for 12 months following the date of the study. FINDINGS: The tracheobronchial tree is patent centrally. There is no focal infiltrate, pleural effusion, or pneumothorax. Extensive bilateral bullous disease is similar when compared to the prior exam. Scattered areas of subsegmental atelectasis/fibrotic change. Previously described cavitary left lower lobe nodule is stable and measures 1.8 cm. The heart is not enlarged. There is  no pericardial effusion. There is no evidence of aortic dissection or pulmonary embolus. Stable aneurysmal dilatation of the ascending aorta (4.4 cm). There is no significant mediastinal or  hilar adenopathy. The partially imaged upper abdomen demonstrates a stable indeterminate lesion adjacent to the splenic hilum. This is better appreciated on prior CT. The osseous structures are acutely intact. There is degenerative disc disease.     IMPRESSION: 1.  Negative for pulmonary embolism. 2.  Stable left lower lobe cavitary nodule. 3.  Extensive bullous disease. 4.  Ascending aortic aneurysm, similar to prior exam.     ED Course     ED Course as of Dec 12 2128   Tue Dec 12, 2017   1822 Patient feeling improved after 1 L of fluids and breathing treatment and steroids.  Blood sugar down to 285.  Creatinine is 1.7.  Will hydrate with a second liter of fluids and CTA patient's chest to evaluate for enlargement of thoracic aortic aneurysm.  Pulse down to 107 bpm.    [SG]   1943 Patient's repeat creatinine is now 1.1.  Have contacted CT and they will take patient to get CT of the chest with and without contrast.  Urine has been obtained and collected, waiting for results from lab.    [SG]      ED Course User Index  [SG] Carrera Loffler, PA-C       Patient received the following medications during stay:  Medications   dextrose (D50W) injection syrg 25 g (has no administration in time range)   glucagon (GLUCAGEN) injection 1 mg (has no administration in time range)   insulin glargine (LANTUS) injection 1-100 Units (has no administration in time range)   insulin lispro (HUMALOG) injection 1-100 Units (has no administration in time range)   insulin lispro (HUMALOG) injection 1-100 Units (has no administration in time range)   albuterol (PROVENTIL VENTOLIN) 2.5 mg /3 mL (0.083 %) nebulizer solution (2.5 mg  Given 12/12/17 1442)   sodium chloride (NS) flush 5-10 mL (10 mL IntraVENous Given 12/12/17 1758)    sodium chloride 0.9 % bolus infusion 1,000 mL (1,000 mL IntraVENous New Bag 12/12/17 1629)   albuterol-ipratropium (DUO-NEB) 2.5 MG-0.5 MG/3 ML (3 mL Nebulization Given 12/12/17 1441)   dexamethasone (DECADRON) injection 10 mg (10 mg IntraVENous Given 12/12/17 1628)   sodium chloride 0.9 % bolus infusion 1,000 mL (1,000 mL IntraVENous New Bag 12/12/17 1758)   iopamidol (ISOVUE-370) 76 % injection 80 mL (80 mL IntraVENous Given 12/12/17 2015)         Most recent vital signs:  Visit Vitals  BP (!) 140/110   Pulse (!) 114   Temp 98.8 ??F (37.1 ??C)   Resp 20   Wt 81.6 kg (179 lb 14.3 oz)   SpO2 94%   BMI 25.81 kg/m??       Medical Decision Making     Patient will be discharged with the following medication prescriptions:  Current Discharge Medication List          Patient off to CT now.  Awaiting urine results.  Blood sugar is now down to 185.  Care handed over to Dr. Sonny Masters.    Continuation by Sedonia Small, MD:  I, Dr. Sedonia Small, have personally seen and examined this patient; I have fully participated in the care of this patient with the advanced practice provider.  I have reviewed and agree with all pertinent clinical information including history, physical exam, labs, radiographic studies and the plan.  I have also reviewed and agree with the medications, allergies and past medical history sections for this patient.  Patient presents with complaints of shortness of breath, elevated blood  sugars, states that her shortness of breath has improved, heart rate improving as well, patient hydrated in the emergency department initially creatinine was elevated but creatinine came back at 1.1, CT of the chest obtained, demonstrating no acute changes, revealing ascending aneurysm, chronic bullous disease as well as a node that it all been visualized similarly on prior CTs.,  At the time of my reevaluation, patient states she feels better and wants to go home, has follow-up scheduled for tomorrow.   Patient does have a urinary tract infection and will be started on Keflex, urine culture obtained.    Joice Lofts, MD    Final Diagnosis   Dyspnea  Uncontrolled diabetes mellitus  COPD   Urinary tract infection      Disposition   Home    Sedonia Small, MD    The patient was personally evaluated by myself and discussed with Jodiann Ognibene, Joya Gaskins, MD who agrees with the above assessment and plan.      Sumler Loffler, PA-C  December 12, 2017    My signature above authenticates this document and my orders, the final ??  diagnosis (es), discharge prescription (s), and instructions in the Epic ??  record.  If you have any questions please contact [phone].  ??  Nursing notes have been reviewed by the physician/ advanced practice ??  Clinician.

## 2017-12-12 NOTE — ED Triage Notes (Signed)
C/o R hip pain shoots down to R leg since 2 days ago, "possibly made a wrong turn". Hx of R hip replacement.    Also c/o high blood sugar since am. +nausea, no vomiting. BS 334 mg/dl in triage

## 2017-12-12 NOTE — ED Notes (Signed)
 C/o R hip pain shoots down to R leg since 2 days ago, possibly made a wrong turn. Hx of R hip replacement.    Also c/o high blood sugar since am. +nausea, no vomiting. BS 334 mg/dl in triage

## 2017-12-12 NOTE — ED Notes (Signed)
10:38 PM  12/12/17     Discharge instructions given to pt (name) with verbalization of understanding. Patient accompanied by .  Patient discharged with the following prescriptionsPatient discharged to home (destination).      [name] [name], RCharity fundraiser

## 2017-12-12 NOTE — Progress Notes (Signed)
prelim code r note    Last admission date  11/18/17    reason for last admission    COPD exacerbation    MD Dr Corinda Gubler    disposition at discharge   home    Insurance   [facility]

## 2017-12-12 NOTE — ED Notes (Signed)
9:58 PM  12/12/17     Discharge instructions given to pt (name) with verbalization of understanding. Patient accompanied by .  Patient discharged with the following prescriptionsPatient discharged to home (destination).      Melrico Morido, RCharity fundraiser

## 2017-12-12 NOTE — ED Provider Notes (Signed)
[place]  Emergency Department Treatment Report    Patient: [name] [name] Age: 67 y.o. Sex: female    Date of Birth: [birth year] Admit Date: 12/12/2017 PCP: Ernst Breach   MRN: [id number]  CSN: [id number]  Attending:  Joice Lofts, MD   Room: ER20/ER20 Time Dictated: 3:20 PM APP:  Johnella Moloney       Chief Complaint   Chief Complaint   Patient presents with   ??? High Blood Sugar   ??? Hip Pain     History of Present Illness   67 y.o. female with multiple medical problems including insulin-dependent diabetes, COPD, marijuana use and chronic pain syndrome presents to the ED for a couple of days of worsening shortness of breath and elevated blood sugars.  History has been obtained primarily through husband.  Husband says patient's blood pressures have been below 301 systolic.  Husband endorses that patient has had chills.  Patient has been denies she has had fevers, chest pain, abdominal pain, vomiting.  Did have some constipation and a couple of episodes of diarrhea.  Denies urinary symptoms, pain or swelling to legs or recent surgery, history of blood clots.  Has been trying albuterol inhalers at home without relief of symptoms.    Review of Systems   Constitutional:  No fever  Eyes: No eye redness or drainage.  ENT: No sore throat, runny nose or ear pain.  Respiratory: See HPI  Cardiovascular: No chest pain, pressure, palpitations, tightness or heaviness.  Gastrointestinal: No vomiting or abdominal pain  Genitourinary: No dysuria, frequency, or urgency.  Musculoskeletal: No joint pain or swelling  Integumentary: No rashes  Neurological: No headaches, focal sensory or motor symptoms.  Denies complaints in all other systems    Past Medical/Surgical History     Past Medical History:   Diagnosis Date   ??? Arthritis    ??? 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 COPD (chronic obstructive pulmonary disease) ([place])    ??? DM2 (diabetes mellitus, type 2) ([place])    ??? GERD  (gastroesophageal reflux disease)    ??? Hepatitis C     HEP C   ??? HTN (hypertension)    ??? Lung nodule, multiple     (CT 10/2016) Small left upper lobe and 1.7 x 1.6 cm left lower lobe nodules not significantly changed from 07/23/2016 and 03/22/2016   ??? Marijuana use    ??? PTSD (post-traumatic stress disorder)     lived through the [place] in 1993   ??? Thoracic ascending aortic aneurysm ([place])     4.4cm noted on CT Chest (10/2016)   ??? Thyroid nodule     2.5 cm stable right thyroid nodule (CT 10/2016)     Past Surgical History:   Procedure Laterality Date   ??? HX ENDOSCOPY     ??? HX HIP REPLACEMENT Right 01/29/2015   ??? HX HIP REPLACEMENT     ??? HX HYSTERECTOMY  2010    hysterectomy       Social History     Social History     Socioeconomic History   ??? Marital status: MARRIED     Spouse name: Not on file   ??? Number of children: Not on file   ??? Years of education: Not on file   ??? Highest education level: Not on file   Tobacco Use   ??? Smoking status: Former Smoker   ??? Smokeless tobacco: Never  Used   ??? Tobacco comment: reported as quit smoking around 1990s per spouse   Substance and Sexual Activity   ??? Alcohol use: No   ??? Drug use: Yes     Types: Marijuana   ??? Sexual activity: Never       Family History     Family History   Problem Relation Age of Onset   ??? Hypertension Mother    ??? Other Mother         OSA   ??? Cancer Father         suspected leukemia per pt's spouse   ??? Cancer Maternal Aunt    ??? Alcohol abuse Maternal Grandfather        Home Medications     Prior to Admission Medications   Prescriptions Last Dose Informant Patient Reported? Taking?   Blood Glucose Control High&Low (ACCU-CHEK AVIVA CONTROL SOLN) soln   No No   Sig: Check blood sugar daily   Blood-Glucose Meter (ACCU-CHEK AVIVA PLUS METER) misc   No No   Sig: Check blood sugar daily   Blood-Glucose Meter monitoring kit   No No   Sig: Check sugars 2 times per day, fasting and 2 hours after dinner   Insulin Needles, Disposable, (NANO PEN NEEDLE) 32  gauge x 5/32" ndle   No No   Sig: To use with Lantus pen   Lancets misc   No No   Sig: Check sugars twice daily   QUEtiapine SR (SEROQUEL XR) 150 mg sr tablet   No No   Sig: TAKE 1 TABLET BY MOUTH NIGHTLY   albuterol (ACCUNEB) 1.25 mg/3 mL nebu   No No   Sig: 3 mL by Nebulization route every six (6) hours as needed (SOB, wheezing). Indications: Asthma Attack   albuterol (PROVENTIL HFA, VENTOLIN HFA, PROAIR HFA) 90 mcg/actuation inhaler   No No   Sig: Take 2 Puffs by inhalation every six (6) hours for 5 days, THEN 2 Puffs every four (4) hours as needed for Wheezing or Shortness of Breath for up to 30 days.   albuterol-ipratropium (DUO-NEB) 2.5 mg-0.5 mg/3 ml nebu   Yes No   Sig: 3 mL by Nebulization route.   alcohol swabs (BD SINGLE USE SWABS REGULAR) padm   No No   Sig: Check blood sugar daily   amLODIPine (NORVASC) 10 mg tablet   Yes No   Sig: Take  by mouth daily.   azithromycin (ZITHROMAX) 500 mg tab   No No   Sig: Take 1 Tab by mouth daily.   cloNIDine HCl (CATAPRES) 0.3 mg tablet   No No   Sig: Take bid   Patient taking differently: Take 1 Tab by mouth two (2) times a day. Take bid   dilTIAZem CD (CARDIZEM CD) 180 mg ER capsule   No No   Sig: Take 1 Cap by mouth daily.   fluticasone propion-salmeterol (ADVAIR DISKUS) 100-50 mcg/dose diskus inhaler   No No   Sig: Take 1 Puff by inhalation two (2) times a day.   gabapentin (NEURONTIN) 100 mg capsule   No No   Sig: Take 1 Cap by mouth three (3) times daily.   glucose blood VI test strips (ACCU-CHEK AVIVA PLUS TEST STRP) strip   No No   Sig: Check blood sugar daily   glucose blood VI test strips (ASCENSIA AUTODISC VI, ONE TOUCH ULTRA TEST VI) strip   No No   Sig: Check sugars twice daily   insulin glargine (LANTUS,BASAGLAR)  100 unit/mL (3 mL) inpn   No No   Sig: Inject 20 units nightly   lancets (ACCU-CHEK SOFTCLIX LANCETS) misc   No No   Sig: Check blood sugar daily   losartan (COZAAR) 100 mg tablet   No No   Sig: TAKE 1 TABLET BY MOUTH DAILY FOR HYPERTENSION    magnesium oxide (MAG-OX) 400 mg tablet   No No   Sig: TAKE 1 TABLET BY MOUTH DAILY   metFORMIN ER (GLUCOPHAGE XR) 750 mg tablet   No No   Sig: Take 1 Tab by mouth two (2) times a day.   montelukast (SINGULAIR) 10 mg tablet   No No   Sig: TAKE 1 TABLET BY MOUTH DAILY   nabumetone (RELAFEN) 750 mg tablet   Yes No   Sig: Take 750 mg by mouth two (2) times a day.   polyethylene glycol (MIRALAX) 17 gram packet   No No   Sig: Take 1 Packet by mouth daily.   predniSONE (DELTASONE) 10 mg tablet   No No   Sig: Take 10 mg by mouth daily (with breakfast). Take 3 PO daily for 4 days,   Then 2 PO daily for 4 days  Then 1 PO daily for 4 days,   Then DC   rosuvastatin (CRESTOR) 5 mg tablet   No No   Sig: TAKE 1 TABLET BY MOUTH NIGHTLY   spironolactone (ALDACTONE) 100 mg tablet   No No   Sig: TAKE 1 TABLET BY MOUTH DAILY   tiotropium bromide (SPIRIVA RESPIMAT) 2.5 mcg/actuation inhaler   No No   Sig: Take 1 Puff by inhalation daily.   traZODone (DESYREL) 50 mg tablet   No No   Sig: Take 1 Tab by mouth nightly as needed for Sleep.   triamcinolone (NASACORT) 55 mcg nasal inhaler   No No   Sig: 2 Sprays by Both Nostrils route daily.      Facility-Administered Medications: None       Allergies     Allergies   Allergen Reactions   ??? Chocolate [Cocoa] Sneezing       Physical Exam     ED Triage Vitals [12/12/17 1201]   ED Encounter Vitals Group      BP 117/85      Pulse (Heart Rate) (!) 125      Resp Rate 20      Temp 98.8 ??F (37.1 ??C)      Temp src       O2 Sat (%) 99 %      Weight 179 lb 14.3 oz      Height      Constitutional: Patient appears uncomfortable and is tachycardic.  Smells of urine.  HEENT: Conjunctiva clear.  PERRLA. Mucous membranes moist, non-erythematous. Surface of the pharynx, palate, and tongue are pink, moist and without lesions.  Neck: Symmetrical, no masses. Normal range of motion.  Respiratory: Lungs with inspiratory wheezing throughout.  Cardiovascular: Heart tachycardic but with regular rate and rhythm without  murmur rubs or gallops. Calves soft and non-tender.  No peripheral edema or significant variscosities.    Gastrointestinal:  Abdomen soft, nontender without complaint of pain to palpation.  Musculoskeletal: No deformities of the limbs.  Integumentary: Warm and dry without rashes or lesions  Neurologic: Alert and moving all limbs spontaneously.    Impression and Management Plan   67 year old female with multiple admissions to the hospital who has COPD and diabetes and chronic pain syndrome presents with difficulty breathing.  She is tachycardic at 125 bpm.  Husband reports that she is had some chills.  She also had difficult to control blood sugar.  Patient smells of urine.    Have initiated sepsis labs with troponin and proBNP, EKG.  Will place patient on monitor.  Will give IV fluids, breathing treatment and steroids.  Will place on Glucomander.  Patient also has a known thoracic aortic aneurysm.  Have added a d-dimer given tachycardia and shortness of breath.  Will likely CTA patient's chest.  No belly pain today.  Will get stool studies for intermittent diarrhea over the last couple of days.    Patient with history of chronic pain syndrome, hesitate to give any opioid medications.  Diagnostic Studies     EKG: Sinus tachycardia with a rate of 112 bpm.  No acute ischemia as read by Dr. Veneta Penton.    Lab:   Recent Results (from the past 24 hour(s))   GLUCOSE, POC    Collection Time: 12/12/17 12:07 PM   Result Value Ref Range    Glucose (POC) 334 (H) 65 - 105 mg/dL   CBC WITH AUTOMATED DIFF    Collection Time: 12/12/17  4:10 PM   Result Value Ref Range    WBC 9.6 4.0 - 11.0 1000/mm3    RBC 5.11 3.60 - 5.20 M/uL    HGB 14.7 13.0 - 17.2 gm/dl    HCT 44.4 37.0 - 50.0 %    MCV 86.9 80.0 - 98.0 fL    MCH 28.8 25.4 - 34.6 pg    MCHC 33.1 30.0 - 36.0 gm/dl    PLATELET 299 140 - 450 1000/mm3    MPV 12.1 (H) 6.0 - 10.0 fL    RDW-SD 42.2 36.4 - 46.3      NRBC 0 0 - 0      IMMATURE GRANULOCYTES 0.2 0.0 - 3.0 %    NEUTROPHILS 64.9  (H) 34 - 64 %    LYMPHOCYTES 26.7 (L) 28 - 48 %    MONOCYTES 7.6 1 - 13 %    EOSINOPHILS 0.4 0 - 5 %    BASOPHILS 0.2 0 - 3 %   METABOLIC PANEL, COMPREHENSIVE    Collection Time: 12/12/17  4:10 PM   Result Value Ref Range    Potassium 4.6 3.5 - 5.1 mEq/L    Chloride 100 98 - 107 mEq/L    Sodium 134 (L) 136 - 145 mEq/L    CO2 27 21 - 32 mEq/L    Glucose 323 (H) 60 - 100 mg/dl    BUN 28 (H) 7 - 25 mg/dl    Creatinine 1.7 (H) 0.6 - 1.3 mg/dl    GFR est AA 39.0      GFR est non-AA 32      Calcium 9.8 8.5 - 10.1 mg/dl    AST (SGOT) 7 (L) 15 - 37 U/L    ALT (SGPT) 16 12 - 78 U/L    Alk. phosphatase 83 45 - 117 U/L    Bilirubin, total 0.6 0.2 - 1.0 mg/dl    Protein, total 8.5 (H) 6.4 - 8.2 gm/dl    Albumin 3.9 3.4 - 5.0 gm/dl    Anion gap 8 5 - 15 mmol/L   TROPONIN I    Collection Time: 12/12/17  4:10 PM   Result Value Ref Range    Troponin-I <0.015 0.00 - 0.09 ng/ml   NT-PRO BNP    Collection Time: 12/12/17  4:10 PM   Result Value  Ref Range    NT pro-BNP 28.9 0.0 - 125.0 pg/ml   LACTIC ACID    Collection Time: 12/12/17  4:10 PM   Result Value Ref Range    Lactic Acid 1.7 0.4 - 2.0 mmol/L   LIPASE    Collection Time: 12/12/17  4:10 PM   Result Value Ref Range    Lipase 110 73 - 393 U/L   URINALYSIS W/ RFLX MICROSCOPIC    Collection Time: 12/12/17  5:50 PM   Result Value Ref Range    Color YELLOW YELLOW,STRAW      Appearance SLIGHTLY CLOUDY (A) CLEAR      Glucose 500 (A) NEGATIVE,Negative mg/dl    Bilirubin NEGATIVE NEGATIVE,Negative      Ketone 15 (A) NEGATIVE,Negative mg/dl    Specific gravity 1.015 1.005 - 1.030      Blood SMALL (A) NEGATIVE,Negative      pH (UA) 6.5 5.0 - 9.0      Protein TRACE (A) NEGATIVE,Negative mg/dl    Urobilinogen 0.2 0.0 - 1.0 mg/dl    Nitrites POSITIVE (A) NEGATIVE,Negative      Leukocyte Esterase MODERATE (A) NEGATIVE,Negative     POC URINE MICROSCOPIC    Collection Time: 12/12/17  5:50 PM   Result Value Ref Range    Epithelial cells, squamous 30-49 /LPF    WBC 30-49 /HPF    RBC OCCASIONAL  /HPF    Bacteria OCCASIONAL /HPF   GLUCOSE, POC    Collection Time: 12/12/17  6:00 PM   Result Value Ref Range    Glucose (POC) 285 (H) 65 - 105 mg/dL   POC CREATININE    Collection Time: 12/12/17  7:32 PM   Result Value Ref Range    Creatinine NO VALUE 0.6 - 1.3 mg/dl   POC CREATININE    Collection Time: 12/12/17  7:39 PM   Result Value Ref Range    Creatinine 1.1 0.6 - 1.3 mg/dl       My preliminary x-ray interpretation: Chest x-ray shows no acute cardiopulmonary disease    Imaging:    Xr Chest Pa Lat    Result Date: 12/12/2017  Chest Indication: SOB and tachycardia.    Comparison: X-ray 11/14/2017, CT 10/09/2017 Findings: Frontal and lateral views of the chest demonstrate that the cardiac silhouette is not enlarged.  Stable 1.8 cm left lower lobe mass. Left basilar atelectasis. Stable bullous changes. No new infiltrate.         Impression: No significant change. No new infiltrate.     Cta Chest W Or W Wo Cont    Result Date: 12/12/2017  CTA OF THE CHEST WITH INTRAVENOUS CONTRAST INDICATION: dyspnea and tachycardia.    COMPARISON: 10/09/2017 TECHNIQUE: CTA of the chest was performed following nonionic intravenous contrast administration. Multiplanar 3-dimensional maximum intensity projection reconstructions were performed and reviewed. DICOM format image data is available to non-affiliated external healthcare facilities or entities on a secure, media free, reciprocally searchable basis with patient authorization for 12 months following the date of the study. FINDINGS: The tracheobronchial tree is patent centrally. There is no focal infiltrate, pleural effusion, or pneumothorax. Extensive bilateral bullous disease is similar when compared to the prior exam. Scattered areas of subsegmental atelectasis/fibrotic change. Previously described cavitary left lower lobe nodule is stable and measures 1.8 cm. The heart is not enlarged. There is no pericardial effusion. There is no evidence of aortic dissection or pulmonary embolus.  Stable aneurysmal dilatation of the ascending aorta (4.4 cm). There is no significant mediastinal or  hilar adenopathy. The partially imaged upper abdomen demonstrates a stable indeterminate lesion adjacent to the splenic hilum. This is better appreciated on prior CT. The osseous structures are acutely intact. There is degenerative disc disease.     IMPRESSION: 1.  Negative for pulmonary embolism. 2.  Stable left lower lobe cavitary nodule. 3.  Extensive bullous disease. 4.  Ascending aortic aneurysm, similar to prior exam.     ED Course     ED Course as of Dec 12 2128   Tue Dec 12, 2017   1822 Patient feeling improved after 1 L of fluids and breathing treatment and steroids.  Blood sugar down to 285.  Creatinine is 1.7.  Will hydrate with a second liter of fluids and CTA patient's chest to evaluate for enlargement of thoracic aortic aneurysm.  Pulse down to 107 bpm.    [SG]   1943 Patient's repeat creatinine is now 1.1.  Have contacted CT and they will take patient to get CT of the chest with and without contrast.  Urine has been obtained and collected, waiting for results from lab.    [SG]      ED Course User Index  [SG] Lovering Loffler, PA-C       Patient received the following medications during stay:  Medications   dextrose (D50W) injection syrg 25 g (has no administration in time range)   glucagon (GLUCAGEN) injection 1 mg (has no administration in time range)   insulin glargine (LANTUS) injection 1-100 Units (has no administration in time range)   insulin lispro (HUMALOG) injection 1-100 Units (has no administration in time range)   insulin lispro (HUMALOG) injection 1-100 Units (has no administration in time range)   albuterol (PROVENTIL VENTOLIN) 2.5 mg /3 mL (0.083 %) nebulizer solution (2.5 mg  Given 12/12/17 1442)   sodium chloride (NS) flush 5-10 mL (10 mL IntraVENous Given 12/12/17 1758)   sodium chloride 0.9 % bolus infusion 1,000 mL (1,000 mL IntraVENous New Bag 12/12/17 1629)   albuterol-ipratropium  (DUO-NEB) 2.5 MG-0.5 MG/3 ML (3 mL Nebulization Given 12/12/17 1441)   dexamethasone (DECADRON) injection 10 mg (10 mg IntraVENous Given 12/12/17 1628)   sodium chloride 0.9 % bolus infusion 1,000 mL (1,000 mL IntraVENous New Bag 12/12/17 1758)   iopamidol (ISOVUE-370) 76 % injection 80 mL (80 mL IntraVENous Given 12/12/17 2015)         Most recent vital signs:  Visit Vitals  BP (!) 140/110   Pulse (!) 114   Temp 98.8 ??F (37.1 ??C)   Resp 20   Wt 81.6 kg (179 lb 14.3 oz)   SpO2 94%   BMI 25.81 kg/m??       Medical Decision Making     Patient will be discharged with the following medication prescriptions:  Current Discharge Medication List          Patient off to CT now.  Awaiting urine results.  Blood sugar is now down to 185.  Care handed over to Dr. Sonny Masters.    Continuation by Sedonia Small, MD:  I, Dr. Sedonia Small, have personally seen and examined this patient; I have fully participated in the care of this patient with the advanced practice provider.  I have reviewed and agree with all pertinent clinical information including history, physical exam, labs, radiographic studies and the plan.  I have also reviewed and agree with the medications, allergies and past medical history sections for this patient.  Patient presents with complaints of shortness of breath, elevated blood  sugars, states that her shortness of breath has improved, heart rate improving as well, patient hydrated in the emergency department initially creatinine was elevated but creatinine came back at 1.1, CT of the chest obtained, demonstrating no acute changes, revealing ascending aneurysm, chronic bullous disease as well as a node that it all been visualized similarly on prior CTs.,  At the time of my reevaluation, patient states she feels better and wants to go home, has follow-up scheduled for tomorrow.  Patient does have a urinary tract infection and will be started on Keflex, urine culture obtained.    Joice Lofts, MD    Final Diagnosis    Dyspnea  Uncontrolled diabetes mellitus  COPD   Urinary tract infection      Disposition   Home    Sedonia Small, MD    The patient was personally evaluated by myself and discussed with Qadir Folks, Joya Gaskins, MD who agrees with the above assessment and plan.      Haze Loffler, PA-C  December 12, 2017    My signature above authenticates this document and my orders, the final ??  diagnosis (es), discharge prescription (s), and instructions in the Epic ??  record.  If you have any questions please contact [phone].  ??  Nursing notes have been reviewed by the physician/ advanced practice ??  Clinician.

## 2017-12-13 ENCOUNTER — Ambulatory Visit: Attending: Physician Assistant | Primary: Physician Assistant

## 2017-12-13 ENCOUNTER — Ambulatory Visit
Admit: 2017-12-13 | Discharge: 2017-12-13 | Payer: MEDICARE | Attending: Physician Assistant | Primary: Physician Assistant

## 2017-12-13 DIAGNOSIS — J441 Chronic obstructive pulmonary disease with (acute) exacerbation: Secondary | ICD-10-CM

## 2017-12-13 LAB — POC URINE MICROSCOPIC

## 2017-12-13 LAB — AMB POC HEMOGLOBIN A1C
Hemoglobin A1C, POC: 11.6 %
Hemoglobin A1c (POC): 11.6 %

## 2017-12-13 MED ORDER — CEPHALEXIN 500 MG CAP
500 mg | ORAL_CAPSULE | ORAL | 0 refills | Status: DC
Start: 2017-12-13 — End: 2018-01-19

## 2017-12-13 NOTE — Progress Notes (Signed)
[name] [name] is a 67 y.o. female here today for a hospital follow up from [facility] for low BP and high blood sugar.

## 2017-12-13 NOTE — Patient Instructions (Signed)
Learning About Meal Planning for Diabetes  Why plan your meals?    Meal planning can be a key part of managing diabetes. Planning meals and snacks with the right balance of carbohydrate, protein, and fat can help you keep your blood sugar at the target level you set with your doctor.  You don't have to eat special foods. You can eat what your family eats, including sweets once in a while. But you do have to pay attention to how often you eat and how much you eat of certain foods.  You may want to work with a dietitian or a certified diabetes educator. He or she can give you tips and meal ideas and can answer your questions about meal planning. This health professional can also help you reach a healthy weight if that is one of your goals.  What plan is right for you?  Your dietitian or diabetes educator may suggest that you start with the plate format or carbohydrate counting.  The plate format  The plate format is a simple way to help you manage how you eat. You plan meals by learning how much space each food should take on a plate. Using the plate format helps you spread carbohydrate throughout the day. It can make it easier to keep your blood sugar level within your target range. It also helps you see if you're eating healthy portion sizes.  To use the plate format, you put non-starchy vegetables on half your plate. Add meat or meat substitutes on one-quarter of the plate. Put a grain or starchy vegetable (such as brown rice or a potato) on the final quarter of the plate. You can add a small piece of fruit and some low-fat or fat-free milk or yogurt, depending on your carbohydrate goal for each meal.  Here are some tips for using the plate format:  ?? Make sure that you are not using an oversized plate. A 9-inch plate is best. Many restaurants use larger plates.  ?? Get used to using the plate format at home. Then you can use it when you eat out.   ?? Write down your questions about using the plate format. Talk to your doctor, a dietitian, or a diabetes educator about your concerns.  Carbohydrate counting  With carbohydrate counting, you plan meals based on the amount of carbohydrate in each food. Carbohydrate raises blood sugar higher and more quickly than any other nutrient. It is found in desserts, breads and cereals, and fruit. It's also found in starchy vegetables such as potatoes and corn, grains such as rice and pasta, and milk and yogurt. Spreading carbohydrate throughout the day helps keep your blood sugar levels within your target range.  Your daily amount depends on several things, including your weight, how active you are, which diabetes medicines you take, and what your goals are for your blood sugar levels. A registered dietitian or diabetes educator can help you plan how much carbohydrate to include in each meal and snack.  A guideline for your daily amount of carbohydrate is:  ?? 45 to 60 grams at each meal. That's about the same as 3 to 4 carbohydrate servings.  ?? 15 to 20 grams at each snack. That's about the same as 1 carbohydrate serving.  The Nutrition Facts label on packaged foods tells you how much carbohydrate is in a serving of the food. First, look at the serving size on the food label. Is that the amount you eat in a serving? All of the   nutrition information on a food label is based on that serving size. So if you eat more or less than that, you'll need to adjust the other numbers. Total carbohydrate is the next thing you need to look for on the label. If you count carbohydrate servings, one serving of carbohydrate is 15 grams.  For foods that don't come with labels, such as fresh fruits and vegetables, you'll need a guide that lists carbohydrate in these foods. Ask your doctor, dietitian, or diabetes educator about books or other nutrition guides you can use.  If you take insulin, you need to know how many grams of carbohydrate are  in a meal. This lets you know how much rapid-acting insulin to take before you eat. If you use an insulin pump, you get a constant rate of insulin during the day. So the pump must be programmed at meals to give you extra insulin to cover the rise in blood sugar after meals.  When you know how much carbohydrate you will eat, you can take the right amount of insulin. Or, if you always use the same amount of insulin, you need to make sure that you eat the same amount of carbohydrate at meals.  If you need more help to understand carbohydrate counting and food labels, ask your doctor, dietitian, or diabetes educator.  How do you get started with meal planning?  Here are some tips to get started:  ?? Plan your meals a week at a time. Don't forget to include snacks too.  ?? Use cookbooks or online recipes to plan several main meals. Plan some quick meals for busy nights. You also can double some recipes that freeze well. Then you can save half for other busy nights when you don't have time to cook.  ?? Make sure you have the ingredients you need for your recipes. If you're running low on basic items, put these items on your shopping list too.  ?? List foods that you use to make breakfasts, lunches, and snacks. List plenty of fruits and vegetables.  ?? Post this list on the refrigerator. Add to it as you think of more things you need.  ?? Take the list to the store to do your weekly shopping.  Follow-up care is a key part of your treatment and safety. Be sure to make and go to all appointments, and call your doctor if you are having problems. It's also a good idea to know your test results and keep a list of the medicines you take.  Where can you learn more?  Go to InsuranceStats.cahttp://www.healthwise.net/GoodHelpConnections.  Enter [phone] in the search box to learn more about "Learning About Meal Planning for Diabetes."  Current as of: January 04, 2017  Content Version: 12.1  ?? 2006-2019 [employer]. Care instructions adapted under  license by [employer] (which disclaims liability or warranty for this information). If you have questions about a medical condition or this instruction, always ask your healthcare professional. [employer] disclaims any warranty or liability for your use of this information.         Diabetes Blood Sugar Emergencies: Your Action Plan  How can you prevent a blood sugar emergency?  An important part of living with diabetes is keeping your blood sugar in your target range. You'll need to know what to do if it's too high or too low. Managing your blood sugar levels helps you avoid emergencies. This care sheet will teach you about the signs of high and low blood  sugar. It will help you make an action plan with your doctor for when these signs occur.  Low blood sugar is more likely to happen if you take certain medicines for diabetes. It can also happen if you skip a meal, drink alcohol, or exercise more than usual.  You may get high blood sugar if you eat differently than you normally do. One example is eating more carbohydrate than usual. Having a cold, the flu, or other sudden illness can also cause high blood sugar levels. Levels can also rise if you miss a dose of medicine.  Any change in how you take your medicine may affect your blood sugar level. So it's important to work with your doctor before you make any changes.  Check your blood sugar  Work with your doctor to fill in the blank spaces below that apply to you.  Track your levels, know your target range, and write down ways you can get your blood sugar back in your target range. A log book can help you track your levels. Take the book to all of your medical appointments.  ?? Check your blood sugar _____ times a day, at these times:________________________________________________.  (For example: Before meals, at bedtime, before exercise, during exercise, other.)  ?? Your blood sugar target range before a meal is ___________________. Your  blood sugar target range after a meal is _______________________.  ?? Do this???___________________________________________________???to get your blood sugar back within your safe range if your blood sugar results are _________________________________________. (For example: Less than 70 or above 250 mg/dL.)  Call your doctor when your blood sugar results are ___________________________________.  (For example: Less than 70 or above 250 mg/dL.)  What are the symptoms of low and high blood sugar?  Common symptoms of low blood sugar are sweating and feeling shaky, weak, hungry, or confused. Symptoms can start quickly.  Common symptoms of high blood sugar are feeling very thirsty or very hungry. You may also pass urine more often than usual. You may have blurry vision and may lose weight without trying.  But some people may have high or low blood sugar without having any symptoms. That's a good reason to check your blood sugar on a regular schedule.  What should you do if you have symptoms?  Work with your doctor to fill in the blank spaces below that apply to you.  Low blood sugar  If you have symptoms of low blood sugar, check your blood sugar. If it's below _____ ( for example, below 70), eat or drink a quick-sugar food that has about 15 grams of carbohydrate. Your goal is to get your level back to your safe range. Check your blood sugar again 15 minutes later. If it's still not in your target range, take another 15 grams of carbohydrate and check your blood sugar again in 15 minutes. Repeat this until you reach your target. Then go back to your regular testing schedule.  When you have low blood sugar, it's best to stop or reduce any physical activity until your blood sugar is back in your target range and is stable. If you must stay active, eat or drink 30 grams of carbohydrate. Then check your blood sugar again in 15 minutes. If it's not in your target range, take another 30 grams of carbohydrates. Check your blood  sugar again in 15 minutes. Keep doing this until you reach your target. You can then go back to your regular testing schedule.  If your symptoms or blood sugar levels   are getting worse or have not improved after 15 minutes, seek medical care right away.  Here are some examples of quick-sugar foods with 15 grams of carbohydrate:  ?? 3 or 4 glucose tablets  ?? 1 tube of glucose gel  ?? Hard candy (such as 3 Jolly Ranchers or 5 to [employer])  ?? ?? cup to ?? cup (4 to 6 ounces) of fruit juice or regular (not diet) soda  High blood sugar  If you have symptoms of high blood sugar, check your blood sugar. Your goal is to get your level back to your target range. If it's above ______ ( for example, above 250), follow these steps:  ?? If you missed a dose of your diabetes medicine, take it now. Take only the amount of medicine that you have been prescribed. Do not take more or less medicine.  ?? Give yourself insulin if your doctor has prescribed it for high blood sugar.  ?? Test for ketones, if the doctor told you to do so. If the results of the ketone test show a moderate-to-large amount of ketones, call the doctor for advice.  ?? Wait 30 minutes after you take the extra insulin or the missed medicine. Check your blood sugar again.  If your symptoms or blood sugar levels are getting worse or have not improved after taking these steps, seek medical care right away.  Follow-up care is a key part of your treatment and safety. Be sure to make and go to all appointments, and call your doctor if you are having problems. It's also a good idea to know your test results and keep a list of the medicines you take.  Where can you learn more?  Go to InsuranceStats.cahttp://www.healthwise.net/GoodHelpConnections.  Enter [phone] in the search box to learn more about "Diabetes Blood Sugar Emergencies: Your Action Plan."  Current as of: January 04, 2017  Content Version: 12.1  ?? 2006-2019 [employer]. Care instructions adapted under  license by [employer] (which disclaims liability or warranty for this information). If you have questions about a medical condition or this instruction, always ask your healthcare professional. [employer] disclaims any warranty or liability for your use of this information.

## 2017-12-13 NOTE — Progress Notes (Signed)
HISTORY OF PRESENT ILLNESS  [name] [name] is a 67 y.o. female.  HPI  [name] [name] is a 67 y.o. female who presents to the office today for ED follow up  She presented to [facility] ED on 12/12/17 for SOB, high blood sugars and chills. She was admitted to ED OBs. She was hydrated with IV fluids, given breathing treatment and steroids. SOB improved. CTA of the chest was negative for acute changes when compared to previous imaged. Cr was elevated initially but this improved to 1.1 after hydration. She was found to have a UTI and treated with Keflex. Urine was sent out of culture.   She was discharged home. She comes in with her husband today. Breathing is better. She is taking her abx as prescribed. She denies burning with urination, abd pain or low back pain.      Chief Complaint   Patient presents with   ??? Hospital Follow Up       Current Outpatient Medications on File Prior to Visit   Medication Sig Dispense Refill   ??? albuterol (ACCUNEB) 1.25 mg/3 mL nebu 3 mL by Nebulization route every six (6) hours as needed (SOB, wheezing). Indications: Asthma Attack 100 Each 0   ??? albuterol-ipratropium (DUO-NEB) 2.5 mg-0.5 mg/3 ml nebu 3 mL by Nebulization route.     ??? Blood Glucose Control High&Low (ACCU-CHEK AVIVA CONTROL SOLN) soln Check blood sugar daily 1 Bottle 0   ??? Blood-Glucose Meter (ACCU-CHEK AVIVA PLUS METER) misc Check blood sugar daily 1 Each 0   ??? glucose blood VI test strips (ACCU-CHEK AVIVA PLUS TEST STRP) strip Check blood sugar daily 100 Strip 5   ??? alcohol swabs (BD SINGLE USE SWABS REGULAR) padm Check blood sugar daily 100 Pad 5   ??? lancets (ACCU-CHEK SOFTCLIX LANCETS) misc Check blood sugar daily 100 Each 5   ??? tiotropium bromide (SPIRIVA RESPIMAT) 2.5 mcg/actuation inhaler Take 1 Puff by inhalation daily. 1 Inhaler 0   ??? fluticasone propion-salmeterol (ADVAIR DISKUS) 100-50 mcg/dose diskus inhaler Take 1 Puff by inhalation two (2) times a day. 1 Inhaler 0    ??? magnesium oxide (MAG-OX) 400 mg tablet TAKE 1 TABLET BY MOUTH DAILY 90 Tab 0   ??? traZODone (DESYREL) 50 mg tablet Take 1 Tab by mouth nightly as needed for Sleep. 20 Tab 0   ??? QUEtiapine SR (SEROQUEL XR) 150 mg sr tablet TAKE 1 TABLET BY MOUTH NIGHTLY 90 Tab 1   ??? rosuvastatin (CRESTOR) 5 mg tablet TAKE 1 TABLET BY MOUTH NIGHTLY 90 Tab 1   ??? polyethylene glycol (MIRALAX) 17 gram packet Take 1 Packet by mouth daily. 100 Packet 1   ??? Blood-Glucose Meter monitoring kit Check sugars 2 times per day, fasting and 2 hours after dinner 1 Kit 0   ??? Lancets misc Check sugars twice daily 200 Each 2   ??? glucose blood VI test strips (ASCENSIA AUTODISC VI, ONE TOUCH ULTRA TEST VI) strip Check sugars twice daily 100 Strip 5   ??? nabumetone (RELAFEN) 750 mg tablet Take 750 mg by mouth two (2) times a day.     ??? triamcinolone (NASACORT) 55 mcg nasal inhaler 2 Sprays by Both Nostrils route daily. 1 Bottle 0     No current facility-administered medications on file prior to visit.      Allergies   Allergen Reactions   ??? Chocolate [Cocoa] Sneezing     Past Medical History:   Diagnosis Date   ??? Arthritis    ???  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 COPD (chronic obstructive pulmonary disease) (HCC)    ??? DM2 (diabetes mellitus, type 2) ([place])    ??? GERD (gastroesophageal reflux disease)    ??? Hepatitis C     HEP C   ??? HTN (hypertension)    ??? Lung nodule, multiple     (CT 10/2016) Small left upper lobe and 1.7 x 1.6 cm left lower lobe nodules not significantly changed from 07/23/2016 and 03/22/2016   ??? Marijuana use    ??? PTSD (post-traumatic stress disorder)     lived through the [place] in 1993   ??? Thoracic ascending aortic aneurysm ([place])     4.4cm noted on CT Chest (10/2016)   ??? Thyroid nodule     2.5 cm stable right thyroid nodule (CT 10/2016)     Social History     Tobacco Use   Smoking Status Former Smoker   Smokeless Tobacco Never Used   Tobacco Comment     reported as quit smoking around 1990s per spouse     Social History     Substance and Sexual Activity   Alcohol Use No     Family History   Problem Relation Age of Onset   ??? Hypertension Mother    ??? Other Mother         OSA   ??? Cancer Father         suspected leukemia per pt's spouse   ??? Cancer Maternal Aunt    ??? Alcohol abuse Maternal Grandfather      Review of Systems   Constitutional: Positive for malaise/fatigue. Negative for chills and fever.   Respiratory: Negative for cough and shortness of breath.    Cardiovascular: Negative for chest pain.   Gastrointestinal: Negative for abdominal pain, nausea and vomiting.   Genitourinary: Negative for dysuria, flank pain, frequency, hematuria and urgency.   Musculoskeletal: Negative for falls.   Neurological: Negative for dizziness and loss of consciousness.     Visit Vitals  BP 103/81 (BP 1 Location: Right arm, BP Patient Position: Sitting)   Pulse (!) 106   Temp 98.8 ??F (37.1 ??C) (Oral)   Resp 18   Ht 5' 10"  (1.778 m)   Wt 190 lb (86.2 kg)   BMI 27.26 kg/m??     Physical Exam   Constitutional: She is oriented to person, place, and time. She appears well-developed and well-nourished. No distress.   Cardiovascular: Normal rate, regular rhythm and normal heart sounds.   Pulmonary/Chest: Effort normal and breath sounds normal. She has no wheezes. She has no rales.   Abdominal: Soft. She exhibits no distension. There is no tenderness. There is no CVA tenderness.   Musculoskeletal: She exhibits no edema.   Neurological: She is alert and oriented to person, place, and time.   Psychiatric: She has a normal mood and affect. Her behavior is normal.   Nursing note and vitals reviewed.      ASSESSMENT and PLAN    ICD-10-CM ICD-9-CM    1. COPD exacerbation ([place]) J44.1 491.21    2. Acute cystitis without hematuria N30.00 595.0    3. Type 2 diabetes mellitus with hyperglycemia, without long-term current use of insulin (HCC) E11.65 250.00 AMB POC HEMOGLOBIN A1C     790.29        COPD is stable. SOB is better  Finish course of keflex as prescribed in the ED. Will follow up on  culture results when available.  DM uncontrolled at 11.6%. Given instructions on med adjustments. Cont to monitor home glucose.   Reviewed medication and side effects. Patient agrees with the plan and verbalizes understanding.   Follow-up and Dispositions    ?? Return in about 1 month (around 01/13/2018) for DM, UTI.       Wallene Huh, PA-C  02/16/2018

## 2017-12-13 NOTE — Progress Notes (Signed)
Anne ShutterMonica R Romanello is a 67 y.o. female here today [facility] and high blood sugar.

## 2017-12-13 NOTE — Progress Notes (Signed)
HISTORY OF PRESENT ILLNESS  [name] [name] is a 67 y.o. female.  HPI  [name] [name] is a 67 y.o. female who presents to the office today for ED follow up  She presented to [facility] ED on 12/12/17 for SOB, high blood sugars and chills. She was admitted to ED OBs. She was hydrated with IV fluids, given breathing treatment and steroids. SOB improved. CTA of the chest was negative for acute changes when compared to previous imaged. Cr was elevated initially but this improved to 1.1 after hydration. She was found to have a UTI and treated with Keflex. Urine was sent out of culture.   She was discharged home. She comes in with her husband today. Breathing is better. She is taking her abx as prescribed. She denies burning with urination, abd pain or low back pain.      Chief Complaint   Patient presents with   ??? Hospital Follow Up       Current Outpatient Medications on File Prior to Visit   Medication Sig Dispense Refill   ??? albuterol (ACCUNEB) 1.25 mg/3 mL nebu 3 mL by Nebulization route every six (6) hours as needed (SOB, wheezing). Indications: Asthma Attack 100 Each 0   ??? albuterol-ipratropium (DUO-NEB) 2.5 mg-0.5 mg/3 ml nebu 3 mL by Nebulization route.     ??? Blood Glucose Control High&Low (ACCU-CHEK AVIVA CONTROL SOLN) soln Check blood sugar daily 1 Bottle 0   ??? Blood-Glucose Meter (ACCU-CHEK AVIVA PLUS METER) misc Check blood sugar daily 1 Each 0   ??? glucose blood VI test strips (ACCU-CHEK AVIVA PLUS TEST STRP) strip Check blood sugar daily 100 Strip 5   ??? alcohol swabs (BD SINGLE USE SWABS REGULAR) padm Check blood sugar daily 100 Pad 5   ??? lancets (ACCU-CHEK SOFTCLIX LANCETS) misc Check blood sugar daily 100 Each 5   ??? tiotropium bromide (SPIRIVA RESPIMAT) 2.5 mcg/actuation inhaler Take 1 Puff by inhalation daily. 1 Inhaler 0   ??? fluticasone propion-salmeterol (ADVAIR DISKUS) 100-50 mcg/dose diskus inhaler Take 1 Puff by inhalation two (2) times a day. 1 Inhaler 0   ??? magnesium oxide (MAG-OX) 400 mg tablet TAKE 1  TABLET BY MOUTH DAILY 90 Tab 0   ??? traZODone (DESYREL) 50 mg tablet Take 1 Tab by mouth nightly as needed for Sleep. 20 Tab 0   ??? QUEtiapine SR (SEROQUEL XR) 150 mg sr tablet TAKE 1 TABLET BY MOUTH NIGHTLY 90 Tab 1   ??? rosuvastatin (CRESTOR) 5 mg tablet TAKE 1 TABLET BY MOUTH NIGHTLY 90 Tab 1   ??? polyethylene glycol (MIRALAX) 17 gram packet Take 1 Packet by mouth daily. 100 Packet 1   ??? Blood-Glucose Meter monitoring kit Check sugars 2 times per day, fasting and 2 hours after dinner 1 Kit 0   ??? Lancets misc Check sugars twice daily 200 Each 2   ??? glucose blood VI test strips (ASCENSIA AUTODISC VI, ONE TOUCH ULTRA TEST VI) strip Check sugars twice daily 100 Strip 5   ??? nabumetone (RELAFEN) 750 mg tablet Take 750 mg by mouth two (2) times a day.     ??? triamcinolone (NASACORT) 55 mcg nasal inhaler 2 Sprays by Both Nostrils route daily. 1 Bottle 0     No current facility-administered medications on file prior to visit.      Allergies   Allergen Reactions   ??? Chocolate [Cocoa] Sneezing     Past Medical History:   Diagnosis Date   ??? Arthritis    ???  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 COPD (chronic obstructive pulmonary disease) (HCC)    ??? DM2 (diabetes mellitus, type 2) ([place])    ??? GERD (gastroesophageal reflux disease)    ??? Hepatitis C     HEP C   ??? HTN (hypertension)    ??? Lung nodule, multiple     (CT 10/2016) Small left upper lobe and 1.7 x 1.6 cm left lower lobe nodules not significantly changed from 07/23/2016 and 03/22/2016   ??? Marijuana use    ??? PTSD (post-traumatic stress disorder)     lived through the [place] in 1993   ??? Thoracic ascending aortic aneurysm ([place])     4.4cm noted on CT Chest (10/2016)   ??? Thyroid nodule     2.5 cm stable right thyroid nodule (CT 10/2016)     Social History     Tobacco Use   Smoking Status Former Smoker   Smokeless Tobacco Never Used   Tobacco Comment    reported as quit smoking around 1990s per spouse     Social History     Substance and Sexual  Activity   Alcohol Use No     Family History   Problem Relation Age of Onset   ??? Hypertension Mother    ??? Other Mother         OSA   ??? Cancer Father         suspected leukemia per pt's spouse   ??? Cancer Maternal Aunt    ??? Alcohol abuse Maternal Grandfather      Review of Systems   Constitutional: Positive for malaise/fatigue. Negative for chills and fever.   Respiratory: Negative for cough and shortness of breath.    Cardiovascular: Negative for chest pain.   Gastrointestinal: Negative for abdominal pain, nausea and vomiting.   Genitourinary: Negative for dysuria, flank pain, frequency, hematuria and urgency.   Musculoskeletal: Negative for falls.   Neurological: Negative for dizziness and loss of consciousness.     Visit Vitals  BP 103/81 (BP 1 Location: Right arm, BP Patient Position: Sitting)   Pulse (!) 106   Temp 98.8 ??F (37.1 ??C) (Oral)   Resp 18   Ht '5\' 10"'$  (1.778 m)   Wt 190 lb (86.2 kg)   BMI 27.26 kg/m??     Physical Exam   Constitutional: She is oriented to person, place, and time. She appears well-developed and well-nourished. No distress.   Cardiovascular: Normal rate, regular rhythm and normal heart sounds.   Pulmonary/Chest: Effort normal and breath sounds normal. She has no wheezes. She has no rales.   Abdominal: Soft. She exhibits no distension. There is no tenderness. There is no CVA tenderness.   Musculoskeletal: She exhibits no edema.   Neurological: She is alert and oriented to person, place, and time.   Psychiatric: She has a normal mood and affect. Her behavior is normal.   Nursing note and vitals reviewed.      ASSESSMENT and PLAN    ICD-10-CM ICD-9-CM    1. COPD exacerbation ([place]) J44.1 491.21    2. Acute cystitis without hematuria N30.00 595.0    3. Type 2 diabetes mellitus with hyperglycemia, without long-term current use of insulin (HCC) E11.65 250.00 AMB POC HEMOGLOBIN A1C     790.29       COPD is stable. SOB is better  Finish course of keflex as prescribed in the ED. Will follow up on  culture results when available.  DM uncontrolled at 11.6%. Given instructions on med adjustments. Cont to monitor home glucose.   Reviewed medication and side effects. Patient agrees with the plan and verbalizes understanding.   Follow-up and Dispositions    ?? Return in about 1 month (around 01/13/2018) for DM, UTI.       Wallene Huh, PA-C  02/16/2018

## 2017-12-15 LAB — CULTURE, URINE
CULTURE RESULT: 50000 — AB
Culture result: 50000 — AB
Isolate: >100000 — AB

## 2017-12-18 LAB — CULTURE, BLOOD 1: BLOOD CULTURE RESULT: NO GROWTH

## 2017-12-18 LAB — CULTURE, BLOOD: Blood Culture Result: NO GROWTH

## 2017-12-19 MED ORDER — INSULIN GLARGINE 100 UNIT/ML (3 ML) SUB-Q PEN
100 unit/mL (3 mL) | SUBCUTANEOUS | 0 refills | Status: DC
Start: 2017-12-19 — End: 2018-01-02

## 2017-12-19 NOTE — Telephone Encounter (Addendum)
After speaking with provider, remain at 28 units at night [facility] Also, hold BP meds if BP is 100/60 or lower and call the office.  I attempted to contact Rose at [phone] but had to leave a message.

## 2017-12-19 NOTE — Telephone Encounter (Signed)
Rose from [facility] called back and was given message from provider. She gave a verbal read back and voiced understanding.

## 2017-12-19 NOTE — Telephone Encounter (Signed)
Rose, from [facility], called asking what the target units of Lantus is supposed to be. Patient was advised to increase by 2 units at night and is up to 28 units. Her AM BS was 175. They also would like to know what are the perimeters for holding her BP meds. Her BP today was 106/86. Please advise.

## 2017-12-20 ENCOUNTER — Encounter

## 2017-12-20 MED ORDER — DILTIAZEM ER 180 MG 24 HR CAP
180 mg | ORAL_CAPSULE | Freq: Every day | ORAL | 1 refills | Status: DC
Start: 2017-12-20 — End: 2017-12-29

## 2017-12-20 MED ORDER — MONTELUKAST 10 MG TAB
10 mg | ORAL_TABLET | ORAL | 0 refills | Status: DC
Start: 2017-12-20 — End: 2017-12-29

## 2017-12-20 MED ORDER — SPIRONOLACTONE 100 MG TAB
100 mg | ORAL_TABLET | ORAL | 0 refills | Status: DC
Start: 2017-12-20 — End: 2017-12-29

## 2017-12-24 ENCOUNTER — Emergency Department: Admit: 2017-12-24 | Payer: MEDICARE | Primary: Physician Assistant

## 2017-12-24 ENCOUNTER — Inpatient Hospital Stay
Admit: 2017-12-24 | Discharge: 2017-12-25 | Disposition: A | Discharge status: Home / Self Care | Payer: MEDICARE | Attending: Emergency Medicine

## 2017-12-24 DIAGNOSIS — K5730 Diverticulosis of large intestine without perforation or abscess without bleeding: Secondary | ICD-10-CM

## 2017-12-24 LAB — POC URINE MACROSCOPIC
Bilirubin, Urine: NEGATIVE
Bilirubin: NEGATIVE
Glucose, Ur: NEGATIVE mg/dl
Glucose: NEGATIVE mg/dl
Ketone: NEGATIVE mg/dl
Ketones, Urine: NEGATIVE mg/dl
Nitrite, Urine: NEGATIVE
Nitrites: NEGATIVE
Protein, UA: 100 mg/dl — AB
Protein: 100 mg/dl — AB
Specific Gravity, UA: 1.015 (ref 1.005–1.030)
Specific gravity: 1.015 (ref 1.005–1.030)
Urobilinogen, UA, POCT: 0.2 EU/dl (ref 0.0–1.0)
Urobilinogen: 0.2 EU/dl (ref 0.0–1.0)
pH (UA): 7 (ref 5–9)
pH, UA: 7 (ref 5–9)

## 2017-12-24 LAB — COMPREHENSIVE METABOLIC PANEL
ALT: 15 U/L (ref 12–78)
AST: 12 U/L — ABNORMAL LOW (ref 15–37)
Albumin: 3.7 gm/dl (ref 3.4–5.0)
Alkaline Phosphatase: 71 U/L (ref 45–117)
Anion Gap: 5 mmol/L (ref 5–15)
BUN: 12 mg/dl (ref 7–25)
CO2: 25 mEq/L (ref 21–32)
Calcium: 9 mg/dl (ref 8.5–10.1)
Chloride: 109 mEq/L — ABNORMAL HIGH (ref 98–107)
Creatinine: 0.9 mg/dl (ref 0.6–1.3)
EGFR IF NonAfrican American: >60
GFR African American: >60
Glucose: 109 mg/dl — ABNORMAL HIGH (ref 74–106)
Potassium: 4.3 mEq/L (ref 3.5–5.1)
Sodium: 140 mEq/L (ref 136–145)
Total Bilirubin: 0.6 mg/dl (ref 0.2–1.0)
Total Protein: 7.4 gm/dl (ref 6.4–8.2)

## 2017-12-24 LAB — CBC WITH AUTO DIFFERENTIAL
Basophils %: 0.3 % (ref 0–3)
Eosinophils %: 0.1 % (ref 0–5)
Hematocrit: 39.6 % (ref 37.0–50.0)
Hemoglobin: 13.3 gm/dl (ref 13.0–17.2)
Immature Granulocytes: 0.4 % (ref 0.0–3.0)
Lymphocytes %: 10.6 % — ABNORMAL LOW (ref 28–48)
MCH: 29.2 pg (ref 25.4–34.6)
MCHC: 33.6 gm/dl (ref 30.0–36.0)
MCV: 86.8 fL (ref 80.0–98.0)
MPV: 10.4 fL — ABNORMAL HIGH (ref 6.0–10.0)
Monocytes %: 4 % (ref 1–13)
Neutrophils %: 84.6 % — ABNORMAL HIGH (ref 34–64)
Nucleated RBCs: 0 (ref 0–0)
Platelets: 331 10*3/uL (ref 140–450)
RBC: 4.56 M/uL (ref 3.60–5.20)
RDW-SD: 42.6 (ref 36.4–46.3)
WBC: 15.2 10*3/uL — ABNORMAL HIGH (ref 4.0–11.0)

## 2017-12-24 LAB — POCT GLUCOSE: POC Glucose: 163 mg/dL — ABNORMAL HIGH (ref 65–105)

## 2017-12-24 LAB — LIPASE
Lipase: 156 U/L (ref 73–393)
Lipase: 156 U/L (ref 73–393)

## 2017-12-24 LAB — LACTIC ACID
LACTIC ACID: 1.2 mmol/L (ref 0.4–2.0)
Lactic Acid: 1.2 mmol/L (ref 0.4–2.0)

## 2017-12-24 LAB — AMB POC CREATININE: Creatinine: 0.9 mg/dl (ref 0.6–1.3)

## 2017-12-24 LAB — CBC WITH AUTOMATED DIFF
BASOPHILS: 0.3 % (ref 0–3)
EOSINOPHILS: 0.1 % (ref 0–5)
HCT: 39.6 % (ref 37.0–50.0)
HGB: 13.3 gm/dl (ref 13.0–17.2)
IMMATURE GRANULOCYTES: 0.4 % (ref 0.0–3.0)
LYMPHOCYTES: 10.6 % — ABNORMAL LOW (ref 28–48)
MCH: 29.2 pg (ref 25.4–34.6)
MCHC: 33.6 gm/dl (ref 30.0–36.0)
MCV: 86.8 fL (ref 80.0–98.0)
MONOCYTES: 4 % (ref 1–13)
MPV: 10.4 fL — ABNORMAL HIGH (ref 6.0–10.0)
NEUTROPHILS: 84.6 % — ABNORMAL HIGH (ref 34–64)
NRBC: 0 (ref 0–0)
PLATELET: 331 10*3/uL (ref 140–450)
RBC: 4.56 M/uL (ref 3.60–5.20)
RDW-SD: 42.6 (ref 36.4–46.3)
WBC: 15.2 10*3/uL — ABNORMAL HIGH (ref 4.0–11.0)

## 2017-12-24 LAB — METABOLIC PANEL, COMPREHENSIVE
ALT (SGPT): 15 U/L (ref 12–78)
AST (SGOT): 12 U/L — ABNORMAL LOW (ref 15–37)
Albumin: 3.7 gm/dl (ref 3.4–5.0)
Alk. phosphatase: 71 U/L (ref 45–117)
Anion gap: 5 mmol/L (ref 5–15)
BUN: 12 mg/dl (ref 7–25)
Bilirubin, total: 0.6 mg/dl (ref 0.2–1.0)
CO2: 25 mEq/L (ref 21–32)
Calcium: 9 mg/dl (ref 8.5–10.1)
Chloride: 109 mEq/L — ABNORMAL HIGH (ref 98–107)
Creatinine: 0.9 mg/dl (ref 0.6–1.3)
GFR est AA: >60
GFR est non-AA: >60
Glucose: 109 mg/dl — ABNORMAL HIGH (ref 74–106)
Potassium: 4.3 mEq/L (ref 3.5–5.1)
Protein, total: 7.4 gm/dl (ref 6.4–8.2)
Sodium: 140 mEq/L (ref 136–145)

## 2017-12-24 LAB — CREATININE, POC: Creatinine: 0.9 mg/dl (ref 0.6–1.3)

## 2017-12-24 LAB — GLUCOSE, POC: Glucose (POC): 163 mg/dL — ABNORMAL HIGH (ref 65–105)

## 2017-12-24 MED ORDER — SODIUM CHLORIDE 0.9% BOLUS IV
0.9 % | INTRAVENOUS | Status: AC
Start: 2017-12-24 — End: 2017-12-24
  Administered 2017-12-24: 23:00:00 via INTRAVENOUS

## 2017-12-24 MED ORDER — MORPHINE 4 MG/ML SYRINGE
4 mg/mL | INTRAMUSCULAR | Status: AC
Start: 2017-12-24 — End: 2017-12-24
  Administered 2017-12-24: 22:00:00 via INTRAVENOUS

## 2017-12-24 MED ORDER — IOPAMIDOL 76 % IV SOLN
370 mg iodine /mL (76 %) | Freq: Once | INTRAVENOUS | Status: AC
Start: 2017-12-24 — End: 2017-12-24
  Administered 2017-12-24: 23:00:00 via INTRAVENOUS

## 2017-12-24 MED ORDER — ONDANSETRON (PF) 4 MG/2 ML INJECTION
4 mg/2 mL | Freq: Once | INTRAMUSCULAR | Status: AC
Start: 2017-12-24 — End: 2017-12-24
  Administered 2017-12-24: 22:00:00 via INTRAVENOUS

## 2017-12-24 MED ORDER — SODIUM CHLORIDE 0.9 % IV
Freq: Once | INTRAVENOUS | Status: AC
Start: 2017-12-24 — End: 2017-12-24
  Administered 2017-12-24: 22:00:00 via INTRAVENOUS

## 2017-12-24 MED FILL — ONDANSETRON (PF) 4 MG/2 ML INJECTION: 4 mg/2 mL | INTRAMUSCULAR | Qty: 2

## 2017-12-24 MED FILL — MORPHINE 4 MG/ML SYRINGE: 4 mg/mL | INTRAMUSCULAR | Qty: 1

## 2017-12-24 MED FILL — ISOVUE-370  76 % INTRAVENOUS SOLUTION: 370 mg iodine /mL (76 %) | INTRAVENOUS | Qty: 80

## 2017-12-24 NOTE — ED Notes (Signed)
9:52 PM  12/24/17     Discharge instructions given to patient (name) with verbalization of understanding. Patient accompanied by husband.  Patient discharged with the following prescriptions zofran. Patient discharged to home (destination).      Haley Kreider

## 2017-12-24 NOTE — ED Provider Notes (Signed)
[place]  Emergency Department Treatment Report    Patient: [name] [name] Age: 67 y.o. Sex: female    Date of Birth: March 07, 1951 Admit Date: 12/24/2017 PCP: Ernst Breach   MRN: [id number]  CSN: [id number]  Attending: Helaine Chess, MD   Room: ER01/ER01 Time Dictated: 4:08 PM APP: Hilton Sinclair, PA-C     Chief Complaint   Chief Complaint   Patient presents with   ??? Hypertension   ??? Abdominal Pain        History of Present Illness   67 y.o. female who presents to the ED with abdominal pain with nausea without vomiting that started last night.  Patient states that the pain is dull and ongoing.  She was recently started on insulin and has been uptitrated.  Her last bowel movement was last night.  The pain is located in her right upper quadrant.  She states that it radiates throughout the abdomen from the right upper quadrant.  She reports chills and diaphoresis.  Nothing she has done has made this feel better or worse.  She recently had a UTI and just finished her medications.  She denies any dysuria.  She also had some shortness of breath but had used her nebulizer and that feels much better.  She states that her cough was dry this morning but it has been better after her nebulizer as well.  She recently had a COPD exacerbation and was treated with steroids as well.    Review of Systems   Review of Systems   Constitutional: Positive for chills and diaphoresis. Negative for fever.   HENT: Negative for sinus pain.    Eyes: Negative for blurred vision, double vision and photophobia.   Respiratory: Positive for cough and shortness of breath (PTA, used neb).    Cardiovascular: Negative for chest pain.   Gastrointestinal: Positive for abdominal pain, nausea and vomiting.   Genitourinary: Negative for dysuria (finished UTI medication).   Musculoskeletal: Negative for joint pain and myalgias.   Skin: Negative for rash.   Neurological: Positive for headaches.    Endo/Heme/Allergies: Negative for environmental allergies.   Psychiatric/Behavioral: Negative for substance abuse.   All other systems reviewed and are negative.      Past Medical/Surgical History     Past Medical History:   Diagnosis Date   ??? Arthritis    ??? 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 COPD (chronic obstructive pulmonary disease) ([place])    ??? DM2 (diabetes mellitus, type 2) ([place])    ??? GERD (gastroesophageal reflux disease)    ??? Hepatitis C     HEP C   ??? HTN (hypertension)    ??? Lung nodule, multiple     (CT 10/2016) Small left upper lobe and 1.7 x 1.6 cm left lower lobe nodules not significantly changed from 07/23/2016 and 03/22/2016   ??? Marijuana use    ??? PTSD (post-traumatic stress disorder)     lived through the [place] in 1993   ??? Thoracic ascending aortic aneurysm ([place])     4.4cm noted on CT Chest (10/2016)   ??? Thyroid nodule     2.5 cm stable right thyroid nodule (CT 10/2016)       Past Surgical History:   Procedure Laterality Date   ??? HX ENDOSCOPY     ??? HX HIP REPLACEMENT Right 01/29/2015   ??? HX HIP REPLACEMENT     ??? HX  HYSTERECTOMY  2010    hysterectomy       Social History     Social History     Socioeconomic History   ??? Marital status: MARRIED     Spouse name: Not on file   ??? Number of children: Not on file   ??? Years of education: Not on file   ??? Highest education level: Not on file   Tobacco Use   ??? Smoking status: Former Smoker   ??? Smokeless tobacco: Never Used   ??? Tobacco comment: reported as quit smoking around 1990s per spouse   Substance and Sexual Activity   ??? Alcohol use: No   ??? Drug use: Yes     Types: Marijuana   ??? Sexual activity: Never       Family History     Family History   Problem Relation Age of Onset   ??? Hypertension Mother    ??? Other Mother         OSA   ??? Cancer Father         suspected leukemia per pt's spouse   ??? Cancer Maternal Aunt    ??? Alcohol abuse Maternal Grandfather        Current Medications     Prior to Admission Medications    Prescriptions Last Dose Informant Patient Reported? Taking?   Blood Glucose Control High&Low (ACCU-CHEK AVIVA CONTROL SOLN) soln   No No   Sig: Check blood sugar daily   Blood-Glucose Meter (ACCU-CHEK AVIVA PLUS METER) misc   No No   Sig: Check blood sugar daily   Blood-Glucose Meter monitoring kit   No No   Sig: Check sugars 2 times per day, fasting and 2 hours after dinner   Insulin Needles, Disposable, (NANO PEN NEEDLE) 32 gauge x 5/32" ndle   No No   Sig: To use with Lantus pen   Lancets misc   No No   Sig: Check sugars twice daily   QUEtiapine SR (SEROQUEL XR) 150 mg sr tablet   No No   Sig: TAKE 1 TABLET BY MOUTH NIGHTLY   albuterol (ACCUNEB) 1.25 mg/3 mL nebu   No No   Sig: 3 mL by Nebulization route every six (6) hours as needed (SOB, wheezing). Indications: Asthma Attack   albuterol (PROVENTIL HFA, VENTOLIN HFA, PROAIR HFA) 90 mcg/actuation inhaler   No No   Sig: Take 2 Puffs by inhalation every six (6) hours for 5 days, THEN 2 Puffs every four (4) hours as needed for Wheezing or Shortness of Breath for up to 30 days.   albuterol-ipratropium (DUO-NEB) 2.5 mg-0.5 mg/3 ml nebu   Yes No   Sig: 3 mL by Nebulization route.   alcohol swabs (BD SINGLE USE SWABS REGULAR) padm   No No   Sig: Check blood sugar daily   cephALEXin (KEFLEX) 500 mg capsule   No No   Sig: Take 1 tab 4 times per day for 7 days   cloNIDine HCl (CATAPRES) 0.3 mg tablet   No No   Sig: Take bid   Patient taking differently: Take 1 Tab by mouth two (2) times a day. Take bid   dilTIAZem CD (CARDIZEM CD) 180 mg ER capsule   No No   Sig: Take 1 Cap by mouth daily.   fluticasone propion-salmeterol (ADVAIR DISKUS) 100-50 mcg/dose diskus inhaler   No No   Sig: Take 1 Puff by inhalation two (2) times a day.   gabapentin (NEURONTIN) 100 mg capsule  No No   Sig: Take 1 Cap by mouth three (3) times daily.   glucose blood VI test strips (ACCU-CHEK AVIVA PLUS TEST STRP) strip   No No   Sig: Check blood sugar daily    glucose blood VI test strips (ASCENSIA AUTODISC VI, ONE TOUCH ULTRA TEST VI) strip   No No   Sig: Check sugars twice daily   insulin glargine (LANTUS SOLOSTAR U-100 INSULIN) 100 unit/mL (3 mL) inpn   No No   Sig: ADMINISTER 28 UNITS UNDER THE SKIN EVERY NIGHT   lancets (ACCU-CHEK SOFTCLIX LANCETS) misc   No No   Sig: Check blood sugar daily   losartan (COZAAR) 100 mg tablet   No No   Sig: TAKE 1 TABLET BY MOUTH DAILY FOR HYPERTENSION   magnesium oxide (MAG-OX) 400 mg tablet   No No   Sig: TAKE 1 TABLET BY MOUTH DAILY   metFORMIN ER (GLUCOPHAGE XR) 750 mg tablet   No No   Sig: Take 1 Tab by mouth two (2) times a day.   Patient taking differently: Take 500 mg by mouth two (2) times a day.   montelukast (SINGULAIR) 10 mg tablet   No No   Sig: TAKE 1 TABLET BY MOUTH DAILY   nabumetone (RELAFEN) 750 mg tablet   Yes No   Sig: Take 750 mg by mouth two (2) times a day.   polyethylene glycol (MIRALAX) 17 gram packet   No No   Sig: Take 1 Packet by mouth daily.   rosuvastatin (CRESTOR) 5 mg tablet   No No   Sig: TAKE 1 TABLET BY MOUTH NIGHTLY   spironolactone (ALDACTONE) 100 mg tablet   No No   Sig: TAKE 1 TABLET BY MOUTH DAILY   tiotropium bromide (SPIRIVA RESPIMAT) 2.5 mcg/actuation inhaler   No No   Sig: Take 1 Puff by inhalation daily.   traZODone (DESYREL) 50 mg tablet   No No   Sig: Take 1 Tab by mouth nightly as needed for Sleep.   triamcinolone (NASACORT) 55 mcg nasal inhaler   No No   Sig: 2 Sprays by Both Nostrils route daily.      Facility-Administered Medications: None       Allergies     Allergies   Allergen Reactions   ??? Chocolate [Cocoa] Sneezing       Physical Exam     ED Triage Vitals   ED Encounter Vitals Group      BP 12/24/17 1446 (!) 153/115      Pulse (Heart Rate) 12/24/17 1446 (!) 123      Resp Rate 12/24/17 1446 20      Temp 12/24/17 1446 98.2 ??F (36.8 ??C)      Temp src --       O2 Sat (%) 12/24/17 1446 96 %      Weight 12/24/17 1443 175 lb      Height 12/24/17 1443 _0        Physical Exam    Constitutional: She is oriented to person, place, and time. She appears distressed.   67 yrs old female who is well developed, well-nourished and appears stated age.   HENT:   Head: Normocephalic and atraumatic.   Right Ear: External ear normal.   Left Ear: External ear normal.   Nose: Nose normal.   Mouth/Throat: Oropharynx is clear and moist.   Eyes: Pupils are equal, round, and reactive to light. Conjunctivae and EOM are normal.   Neck: Normal range  of motion. Neck supple.   Cardiovascular: Normal rate, regular rhythm, normal heart sounds and intact distal pulses.   Pulmonary/Chest: Effort normal and breath sounds normal. No respiratory distress. She has no wheezes.   Abdominal: Soft. Normal appearance and bowel sounds are normal. She exhibits no distension. There is generalized tenderness and tenderness in the right upper quadrant. There is positive Murphy's sign.   Musculoskeletal: Normal range of motion. She exhibits no deformity.   Neurological: She is alert and oriented to person, place, and time. No cranial nerve deficit. GCS score is 15.   Skin: Skin is warm and dry. No rash noted.   Psychiatric: Mood, memory, affect and judgment normal.   Nursing note and vitals reviewed.      Impression and Management Plan   67 y.o. female presents with right upper quadrant pain with nausea and vomiting.  We will go ahead and get labs to include CBC, CMP and a lipase and we will order a right upper quadrant ultrasound to evaluate for cholecystitis.  I will go ahead and give her some morphine and Zofran.    Diagnostic Studies   Lab:   Recent Results (from the past 12 hour(s))   GLUCOSE, POC    Collection Time: 12/24/17  4:18 PM   Result Value Ref Range    Glucose (POC) 163 (H) 65 - 105 mg/dL   CBC WITH AUTOMATED DIFF    Collection Time: 12/24/17  6:00 PM   Result Value Ref Range    WBC 15.2 (H) 4.0 - 11.0 1000/mm3    RBC 4.56 3.60 - 5.20 M/uL    HGB 13.3 13.0 - 17.2 gm/dl    HCT 39.6 37.0 - 50.0 %     MCV 86.8 80.0 - 98.0 fL    MCH 29.2 25.4 - 34.6 pg    MCHC 33.6 30.0 - 36.0 gm/dl    PLATELET 331 140 - 450 1000/mm3    MPV 10.4 (H) 6.0 - 10.0 fL    RDW-SD 42.6 36.4 - 46.3      NRBC 0 0 - 0      IMMATURE GRANULOCYTES 0.4 0.0 - 3.0 %    NEUTROPHILS 84.6 (H) 34 - 64 %    LYMPHOCYTES 10.6 (L) 28 - 48 %    MONOCYTES 4.0 1 - 13 %    EOSINOPHILS 0.1 0 - 5 %    BASOPHILS 0.3 0 - 3 %   LIPASE    Collection Time: 12/24/17  6:00 PM   Result Value Ref Range    Lipase CANCELED U/L   POC CREATININE    Collection Time: 12/24/17  6:30 PM   Result Value Ref Range    Creatinine 0.9 0.6 - 1.3 mg/dl   LACTIC ACID    Collection Time: 12/24/17  7:24 PM   Result Value Ref Range    Lactic Acid 1.2 0.4 - 2.0 mmol/L   METABOLIC PANEL, COMPREHENSIVE    Collection Time: 12/24/17  7:24 PM   Result Value Ref Range    Sodium 140 136 - 145 mEq/L    Potassium 4.3 3.5 - 5.1 mEq/L    Chloride 109 (H) 98 - 107 mEq/L    CO2 25 21 - 32 mEq/L    Glucose 109 (H) 74 - 106 mg/dl    BUN 12 7 - 25 mg/dl    Creatinine 0.9 0.6 - 1.3 mg/dl    GFR est AA >60.0      GFR est non-AA >60  Calcium 9.0 8.5 - 10.1 mg/dl    AST (SGOT) 12 (L) 15 - 37 U/L    ALT (SGPT) 15 12 - 78 U/L    Alk. phosphatase 71 45 - 117 U/L    Bilirubin, total 0.6 0.2 - 1.0 mg/dl    Protein, total 7.4 6.4 - 8.2 gm/dl    Albumin 3.7 3.4 - 5.0 gm/dl    Anion gap 5 5 - 15 mmol/L   LIPASE    Collection Time: 12/24/17  7:24 PM   Result Value Ref Range    Lipase 156 73 - 393 U/L   POC URINE MACROSCOPIC    Collection Time: 12/24/17  7:27 PM   Result Value Ref Range    Glucose Negative NEGATIVE,Negative mg/dl    Bilirubin Negative NEGATIVE,Negative      Ketone Negative NEGATIVE,Negative mg/dl    Specific gravity 1.015 1.005 - 1.030      Blood Trace-lysed (A) NEGATIVE,Negative      pH (UA) 7.0 5 - 9      Protein 100 (A) NEGATIVE,Negative mg/dl    Urobilinogen 0.2 0.0 - 1.0 EU/dl    Nitrites Negative NEGATIVE,Negative      Leukocyte Esterase Moderate (A) NEGATIVE,Negative      Color Yellow       Appearance Cloudy     POC URINE MICROSCOPIC    Collection Time: 12/24/17  7:27 PM   Result Value Ref Range    Epithelial cells, squamous TNTC /LPF    Transitional epithelial cells OCCASIONAL /LPF    WBC 15-29 /HPF    Bacteria 1+ /HPF       Imaging:  (All imaging personally reviewed by myself in conjunction with MANOLIO, Janine Ores, MD)  Ct Abd Pelv W Cont    Result Date: 12/24/2017  INDICATION: Abdominal pain COMPARISON: CT dated 08/27/2016 TECHNIQUE: CT of the abdomen and pelvis WITH intravenous contrast. DICOM format image data is available to non-affiliated external healthcare facilities or entities on a secure, media free, reciprocally searchable basis with patient authorization for 12 months following the date of the study. Findings: Emphysematous changes involving the lower lungs. Liver, gallbladder, pancreas, and adrenal glands grossly unremarkable. Interval increase in size of a 1.3 cm low-attenuation splenic lesion (image 12 on series 2), previously measuring 1.1 cm. A few additional smaller splenic lesions are noted inferiorly, some, also slightly larger in size. Small nonobstructing bilateral renal calculi, measuring up to 6 mm in maximal dimension on the right and punctate on the left. A few scattered too small to characterize bilateral renal hypodensities. Prominent colonic diverticulosis without evidence of acute diverticulitis. A normal appendix. Stomach and small bowel grossly unremarkable. A few scattered upper abdominal lymph nodes, which appeared the same and are essentially not enlarged by CT criteria. A few small retroperitoneal and mesenteric lymph nodes, also unchanged. A narrow necked ventral abdominal wall hernia just inferior to the left hepatic lobe, with the neck roughly measuring 1.1 cm and the hernia content measuring 2.2 cm. The hernia contains fat, which is likely related to the greater omentum. There is prominent stranding both involving the  fat within the ventral abdominal wall, just deep to the hernia, as well as the fat within the hernia sac. There is increased density surrounding the fat of the hernia content. Hysterectomy. Right hip prosthesis. Tiny fat-containing umbilical hernia.     IMPRESSION: 1. Small fat-containing ventral abdominal wall hernia with mild stranding of the fat. This may reflect mild inflammatory change or early findings related  to fat ischemia/strangulation. Correlate clinically. 2. Scattered small splenic hypodensities, slightly increased since the previous exam. The exact etiology is unknown. Findings may reflect multiple pseudocyst or hemangioma. 3. Colonic diverticulosis. No evidence of acute diverticulitis. 4. Hysterectomy. 5. Nonobstructing renal calculi.     [place]    Result Date: 12/24/2017  Examination: Ultrasound right upper quadrant. INDICATION: Pain. FINDINGS: The liver appears grossly unremarkable. No focal hepatic lesions definitely identified. The gallbladder appears unremarkable. No gallbladder wall thickening. No sonographic Murphy's sign. CBD within normal limits roughly measuring 0.3 cm. Right kidney appears unremarkable and is normal in size. A 4 mm nonobstructing renal calculus. Tiny cyst on previous CT dated 08/27/2016, not well visualized on the current exam. Proximal aorta and IVC appear unremarkable.     IMPRESSION: 1. No acute abnormalities. 2. No gallbladder wall thickening, gallstones or evidence of acute cholecystitis. 3. Nonobstructing right renal calculus.        ED Course     Patient Vitals for the past 12 hrs:   Temp Pulse Resp BP SpO2   12/24/17 2121 ??? 92 19 ??? 100 %   12/24/17 2102 ??? 94 17 (!) 139/102 100 %   12/24/17 2040 ??? 96 20 ??? 97 %   12/24/17 2014 ??? (!) 110 21 (!) 159/107 92 %   12/24/17 2011 ??? (!) 110 29 ??? 97 %   12/24/17 1925 98.4 ??F (36.9 ??C) 88 21 123/70 99 %   12/24/17 1830 ??? 89 19 ??? 98 %   12/24/17 1813 ??? 87 ??? 121/84 99 %   12/24/17 1741 ??? 93 18 116/77 98 %    12/24/17 1601 ??? ??? ??? 102/74 96 %   12/24/17 1446 98.2 ??F (36.8 ??C) (!) 123 20 (!) 153/115 96 %       ED Course as of Dec 24 2125   Sun Dec 24, 2017   1926 1. Small fat-containing ventral abdominal wall hernia with mild stranding of the fat. This may reflect mild inflammatory change or early findings related to fat ischemia/strangulation. Correlate clinically.  2. Scattered small splenic hypodensities, slightly increased since the previous exam. The exact etiology is unknown. Findings may reflect multiple pseudocyst or hemangioma.  3. Colonic diverticulosis. No evidence of acute diverticulitis.  4. Hysterectomy.  5. Nonobstructing renal calculi.   CT ABD PELV W CONT [SH]   2012 Went to reevaluate the patient.  She is having shortness of breath and tachypnea.  She is wheezing.  We will give her some Solu-Medrol and a DuoNeb treatment.    [SH]      ED Course User Index  [SH] Hilton Sinclair, PA-C       Medications   ondansetron [facility]) injection 4 mg (4 mg IntraVENous Given 12/24/17 1815)   0.9% sodium chloride infusion (0 mL/hr IntraVENous IV Completed 12/24/17 1929)   morphine injection 4 mg (4 mg IntraVENous Given 12/24/17 1815)   sodium chloride 0.9 % bolus infusion 1,000 mL (1,000 mL IntraVENous New Bag 12/24/17 1927)   iopamidol (ISOVUE-370) 76 % injection 80 mL (80 mL IntraVENous Given 12/24/17 1848)   albuterol-ipratropium (DUO-NEB) 2.5 MG-0.5 MG/3 ML (3 mL Nebulization Given 12/24/17 2015)   methylPREDNISolone (PF) (Solu-MEDROL) injection 125 mg (125 mg IntraVENous Given 12/24/17 2015)       Medical Decision Making   Patient has had improvement in her abdominal pain and improvement in her nausea as well.  She has no surgical reason for her abdominal pain seen on her labs or  imaging.  Her leukocytosis is likely secondary to her steroids which she was just on.  She has some leukoesterase on her UA but has no dysuria and her urinary symptoms have all resolved.  She did have another  episode of some shortness of breath which is common for her and it was relieved after a DuoNeb and she received 1 dose of steroids.  There was no sign of peritonitis or evidence of other life-threatening or emergent etiology. The patient appears stable for discharge and has been instructed to return immediately if the symptoms worsen or change in any way, or in 8-12 hours if not improved for re-evaluation. Abdominal pain warnings were given and the need for close follow-up was emphasized.  Patient is advised to follow-up with her primary care provider or return to the emergency room if she is worsening of symptoms.     Final Diagnosis       ICD-10-CM ICD-9-CM   1. Nausea without vomiting R11.0 787.02   2. Abdominal pain, generalized R10.84 789.07       Disposition   Discharge to home    Current Discharge Medication List      START taking these medications    Details   ondansetron (ZOFRAN ODT) 4 mg disintegrating tablet Take 1 Tab by mouth every eight (8) hours as needed for Nausea. Indications: nausea  Qty: 12 Tab, Refills: 0             Hilton Sinclair, MPA, PA-C  December 24, 2017  4:08 PM    The patient was personally evaluated by myself and MANOLIO, Janine Ores, MD who agrees with the above assessment and plan.    My signature above authenticates this document and my orders, the final diagnosis(es), discharge prescription (s), and instructions in the Epic record. If you have any questions please contact [phone].     Nursing notes have been reviewed by the Physician/Advanced Practice Clinician. Dragon medical dictation software was used for portions of this report. Unintended voice recognition errors may occur.

## 2017-12-24 NOTE — ED Triage Notes (Signed)
Pt arrived to ER from home with c/o HTN, headache, and abd pain that onset last night. Pt states she has been taking her HTN meds as rx'd and did take OTC meds for the headache with no relief.

## 2017-12-24 NOTE — ED Notes (Signed)
9:52 PM  12/24/17     Discharge instructions given to patient (name) with verbalization of understanding. Patient accompanied by husband.  Patient discharged with the following prescriptions zofran. Patient discharged to home (destination).      [name] HolmesHaley [name]

## 2017-12-24 NOTE — ED Provider Notes (Signed)
ED Provider Notes by Hilton Sinclair, PA-C at 12/24/17 1608                Author: Hilton Sinclair, PA-C  Service: Emergency Medicine  Author Type: Physician Assistant       Filed: 12/24/17 2130  Date of Service: 12/24/17 1608  Status: Attested           Editor: Artemio Aly (Physician Assistant)  Cosigner: Helaine Chess, MD at 12/24/17 2142          Attestation signed by Helaine Chess, MD at 12/24/17 2142          I, Helaine Chess, MD , have personally seen  and examined this patient; I have fully participated in the care of this patient with the advanced practice provider.  I have reviewed and agree with all pertinent clinical information including history, physical exam, labs, radiographic studies and the  plan.  I have also reviewed and agree with the medications, allergies and past medical history sections for this patient.  The patient's work-up was unremarkable, she did have an exacerbation of her COPD while here and that is been treated.  She is feeling  comfortable and ready for discharge.      Helaine Chess, MD   December 24, 2017                                       [place]   Emergency Department Treatment Report          Patient: [name] [name]  Age: 67 y.o.  Sex: female          Date of Birth: [birth year]  Admit Date: 12/24/2017  PCP: Ernst Breach     MRN: [id number]   CSN: [id number]   Attending: Helaine Chess, MD         Room: ER01/ER01  Time Dictated: 4:08 PM  APP: Hilton Sinclair, PA-C        Chief Complaint      Chief Complaint       Patient presents with        ?  Hypertension        ?  Abdominal Pain              History of Present Illness     67 y.o. female  who presents to the ED with abdominal pain with nausea without vomiting that started last night.  Patient states that the pain is dull and ongoing.  She was recently started on insulin and has been uptitrated.  Her last bowel movement was last night.   The pain is located in her  right upper quadrant.  She states that it radiates throughout the abdomen from the right upper quadrant.  She reports chills and diaphoresis.  Nothing she has done has made this feel better or worse.  She recently had a UTI  and just finished her medications.  She denies any dysuria.  She also had some shortness of breath but had used her nebulizer and that feels much better.  She states that her cough was dry this morning but it has been better after her nebulizer as well.   She recently had a COPD exacerbation and was treated with steroids as well.        Review of Systems     Review  of Systems    Constitutional: Positive for chills and diaphoresis . Negative for fever.    HENT: Negative for sinus pain.     Eyes: Negative for blurred vision, double vision and photophobia.    Respiratory: Positive for cough and shortness of breath  (PTA, used neb).     Cardiovascular: Negative for chest pain.    Gastrointestinal: Positive for abdominal pain, nausea  and vomiting.    Genitourinary: Negative for dysuria (finished UTI medication).    Musculoskeletal: Negative for joint pain and myalgias.    Skin: Negative for rash.    Neurological: Positive for headaches.    Endo/Heme/Allergies: Negative for environmental allergies.    Psychiatric/Behavioral: Negative for substance abuse.    All other systems reviewed and are negative.           Past Medical/Surgical History          Past Medical History:        Diagnosis  Date         ?  Arthritis       ?  Chronic pain syndrome            related to R hip replacement         ?  COPD (chronic obstructive pulmonary disease) ([place])       ?  DM2 (diabetes mellitus, type 2) ([place])       ?  GERD (gastroesophageal reflux disease)       ?  Hepatitis C            HEP C         ?  HTN (hypertension)       ?  Lung nodule, multiple            (CT 10/2016) Small left upper lobe and 1.7 x 1.6 cm left lower lobe nodules not significantly changed from 07/23/2016 and 03/22/2016         ?  Marijuana use        ?  PTSD (post-traumatic stress disorder)            lived through the [place] in 1993         ?  Thoracic ascending aortic aneurysm ([place])            4.4cm noted on CT Chest (10/2016)         ?  Thyroid nodule            2.5 cm stable right thyroid nodule (CT 10/2016)             Past Surgical History:         Procedure  Laterality  Date          ?  HX ENDOSCOPY         ?  HX HIP REPLACEMENT  Right  01/29/2015     ?  HX HIP REPLACEMENT         ?  HX HYSTERECTOMY    2010          hysterectomy             Social History          Social History          Socioeconomic History         ?  Marital status:  MARRIED              Spouse name:  Not on file         ?  Number of children:  Not on file     ?  Years of education:  Not on file     ?  Highest education level:  Not on file       Tobacco Use         ?  Smoking status:  Former Smoker     ?  Smokeless tobacco:  Never Used        ?  Tobacco comment: reported as quit smoking around 1990s per spouse       Substance and Sexual Activity         ?  Alcohol use:  No     ?  Drug use:  Yes              Types:  Marijuana         ?  Sexual activity:  Never             Family History          Family History         Problem  Relation  Age of Onset          ?  Hypertension  Mother       ?  Other  Mother                OSA          ?  Cancer  Father                suspected leukemia per pt's spouse          ?  Cancer  Maternal Aunt            ?  Alcohol abuse  Maternal Grandfather               Current Medications          Prior to Admission Medications     Prescriptions  Last Dose  Informant  Patient Reported?  Taking?      Blood Glucose Control High&Low (ACCU-CHEK AVIVA CONTROL SOLN) soln      No  No      Sig: Check blood sugar daily      Blood-Glucose Meter (ACCU-CHEK AVIVA PLUS METER) misc      No  No      Sig: Check blood sugar daily      Blood-Glucose Meter monitoring kit      No  No      Sig: Check sugars 2 times per day, fasting and 2 hours after dinner       Insulin Needles, Disposable, (NANO PEN NEEDLE) 32 gauge x 5/32" ndle      No  No      Sig: To use with Lantus pen      Lancets misc      No  No      Sig: Check sugars twice daily      QUEtiapine SR (SEROQUEL XR) 150 mg sr tablet      No  No      Sig: TAKE 1 TABLET BY MOUTH NIGHTLY      albuterol (ACCUNEB) 1.25 mg/3 mL nebu      No  No      Sig: 3 mL by Nebulization route every six (6) hours as needed (SOB, wheezing). Indications: Asthma Attack      albuterol (PROVENTIL HFA, VENTOLIN HFA, PROAIR HFA) 90 mcg/actuation inhaler      No  No  Sig: Take 2 Puffs by inhalation every six (6) hours for 5 days, THEN 2 Puffs every four (4) hours as needed for Wheezing or Shortness of Breath for  up to 30 days.      albuterol-ipratropium (DUO-NEB) 2.5 mg-0.5 mg/3 ml nebu      Yes  No      Sig: 3 mL by Nebulization route.      alcohol swabs (BD SINGLE USE SWABS REGULAR) padm      No  No      Sig: Check blood sugar daily      cephALEXin (KEFLEX) 500 mg capsule      No  No      Sig: Take 1 tab 4 times per day for 7 days      cloNIDine HCl (CATAPRES) 0.3 mg tablet      No  No      Sig: Take bid      Patient taking differently: Take 1 Tab by mouth two (2) times a day. Take bid      dilTIAZem CD (CARDIZEM CD) 180 mg ER capsule      No  No      Sig: Take 1 Cap by mouth daily.      fluticasone propion-salmeterol (ADVAIR DISKUS) 100-50 mcg/dose diskus inhaler      No  No      Sig: Take 1 Puff by inhalation two (2) times a day.      gabapentin (NEURONTIN) 100 mg capsule      No  No      Sig: Take 1 Cap by mouth three (3) times daily.      glucose blood VI test strips (ACCU-CHEK AVIVA PLUS TEST STRP) strip      No  No      Sig: Check blood sugar daily      glucose blood VI test strips (ASCENSIA AUTODISC VI, ONE TOUCH ULTRA TEST VI) strip      No  No      Sig: Check sugars twice daily      insulin glargine (LANTUS SOLOSTAR U-100 INSULIN) 100 unit/mL (3 mL) inpn      No  No      Sig: ADMINISTER 28 UNITS UNDER THE SKIN EVERY NIGHT       lancets (ACCU-CHEK SOFTCLIX LANCETS) misc      No  No      Sig: Check blood sugar daily      losartan (COZAAR) 100 mg tablet      No  No      Sig: TAKE 1 TABLET BY MOUTH DAILY FOR HYPERTENSION      magnesium oxide (MAG-OX) 400 mg tablet      No  No      Sig: TAKE 1 TABLET BY MOUTH DAILY      metFORMIN ER (GLUCOPHAGE XR) 750 mg tablet      No  No      Sig: Take 1 Tab by mouth two (2) times a day.      Patient taking differently: Take 500 mg by mouth two (2) times a day.      montelukast (SINGULAIR) 10 mg tablet      No  No      Sig: TAKE 1 TABLET BY MOUTH DAILY      nabumetone (RELAFEN) 750 mg tablet      Yes  No      Sig: Take 750 mg by mouth two (2) times a day.  polyethylene glycol (MIRALAX) 17 gram packet      No  No      Sig: Take 1 Packet by mouth daily.      rosuvastatin (CRESTOR) 5 mg tablet      No  No      Sig: TAKE 1 TABLET BY MOUTH NIGHTLY      spironolactone (ALDACTONE) 100 mg tablet      No  No      Sig: TAKE 1 TABLET BY MOUTH DAILY      tiotropium bromide (SPIRIVA RESPIMAT) 2.5 mcg/actuation inhaler      No  No      Sig: Take 1 Puff by inhalation daily.      traZODone (DESYREL) 50 mg tablet      No  No      Sig: Take 1 Tab by mouth nightly as needed for Sleep.      triamcinolone (NASACORT) 55 mcg nasal inhaler      No  No      Sig: 2 Sprays by Both Nostrils route daily.               Facility-Administered Medications: None             Allergies          Allergies        Allergen  Reactions         ?  Chocolate [Cocoa]  Sneezing             Physical Exam          ED Triage Vitals     ED Encounter Vitals Group            BP  12/24/17 1446  (!) 153/115        Pulse (Heart Rate)  12/24/17 1446  (!) 123        Resp Rate  12/24/17 1446  20        Temp  12/24/17 1446  98.2 ??F (36.8 ??C)        Temp src  --          O2 Sat (%)  12/24/17 1446  96 %        Weight  12/24/17 1443  175 lb            Height  12/24/17 1443  '5\' 10"'            Physical Exam    Constitutional: She is oriented to person, place, and  time. She appears distressed.    67 yrs old female who is well developed, well-nourished and appears stated age.    HENT:    Head: Normocephalic and atraumatic.   Right Ear: External ear normal.   Left Ear: External ear normal.    Nose: Nose normal.    Mouth/Throat: Oropharynx is clear and moist.    Eyes: Pupils are equal, round, and reactive to light. Conjunctivae and EOM are normal.    Neck: Normal range of motion. Neck supple.    Cardiovascular: Normal rate, regular rhythm, normal heart sounds and intact distal pulses.    Pulmonary/Chest: Effort normal and breath sounds normal. No respiratory distress. She has no wheezes.    Abdominal: Soft. Normal appearance and bowel sounds are normal. She exhibits no distension. There is generalized tenderness and tenderness  in the right upper quadrant. There is positive Murphy's sign .    Musculoskeletal: Normal range of motion. She exhibits no deformity.   Neurological: She  is alert and oriented to person, place, and time. No  cranial nerve deficit. GCS score is 15.    Skin: Skin is warm and dry. No rash noted.   Psychiatric: Mood, memory, affect and judgment normal.    Nursing note and vitals reviewed.           Impression and Management Plan     67 y.o. female  presents with right upper quadrant pain with nausea and vomiting.  We will go ahead and get labs to include CBC, CMP and a lipase and we will order a right upper quadrant ultrasound to evaluate for cholecystitis.  I will go ahead and give her some morphine  and Zofran.        Diagnostic Studies     Lab:      Recent Results (from the past 12 hour(s))     GLUCOSE, POC          Collection Time: 12/24/17  4:18 PM         Result  Value  Ref Range            Glucose (POC)  163 (H)  65 - 105 mg/dL       CBC WITH AUTOMATED DIFF          Collection Time: 12/24/17  6:00 PM         Result  Value  Ref Range            WBC  15.2 (H)  4.0 - 11.0 1000/mm3       RBC  4.56  3.60 - 5.20 M/uL       HGB  13.3  13.0 - 17.2 gm/dl        HCT  39.6  37.0 - 50.0 %       MCV  86.8  80.0 - 98.0 fL       MCH  29.2  25.4 - 34.6 pg       MCHC  33.6  30.0 - 36.0 gm/dl       PLATELET  331  140 - 450 1000/mm3       MPV  10.4 (H)  6.0 - 10.0 fL       RDW-SD  42.6  36.4 - 46.3         NRBC  0  0 - 0         IMMATURE GRANULOCYTES  0.4  0.0 - 3.0 %       NEUTROPHILS  84.6 (H)  34 - 64 %       LYMPHOCYTES  10.6 (L)  28 - 48 %       MONOCYTES  4.0  1 - 13 %       EOSINOPHILS  0.1  0 - 5 %       BASOPHILS  0.3  0 - 3 %       LIPASE          Collection Time: 12/24/17  6:00 PM         Result  Value  Ref Range            Lipase  CANCELED  U/L       POC CREATININE          Collection Time: 12/24/17  6:30 PM         Result  Value  Ref Range            Creatinine  0.9  0.6 - 1.3 mg/dl  LACTIC ACID          Collection Time: 12/24/17  7:24 PM         Result  Value  Ref Range            Lactic Acid  1.2  0.4 - 2.0 mmol/L       METABOLIC PANEL, COMPREHENSIVE          Collection Time: 12/24/17  7:24 PM         Result  Value  Ref Range            Sodium  140  136 - 145 mEq/L       Potassium  4.3  3.5 - 5.1 mEq/L       Chloride  109 (H)  98 - 107 mEq/L       CO2  25  21 - 32 mEq/L       Glucose  109 (H)  74 - 106 mg/dl       BUN  12  7 - 25 mg/dl       Creatinine  0.9  0.6 - 1.3 mg/dl       GFR est AA  >60.0          GFR est non-AA  >60          Calcium  9.0  8.5 - 10.1 mg/dl       AST (SGOT)  12 (L)  15 - 37 U/L       ALT (SGPT)  15  12 - 78 U/L       Alk. phosphatase  71  45 - 117 U/L       Bilirubin, total  0.6  0.2 - 1.0 mg/dl       Protein, total  7.4  6.4 - 8.2 gm/dl       Albumin  3.7  3.4 - 5.0 gm/dl       Anion gap  5  5 - 15 mmol/L       LIPASE          Collection Time: 12/24/17  7:24 PM         Result  Value  Ref Range            Lipase  156  73 - 393 U/L       POC URINE MACROSCOPIC          Collection Time: 12/24/17  7:27 PM         Result  Value  Ref Range            Glucose  Negative  NEGATIVE,Negative mg/dl       Bilirubin  Negative  NEGATIVE,Negative          Ketone  Negative  NEGATIVE,Negative mg/dl       Specific gravity  1.015  1.005 - 1.030         Blood  Trace-lysed (A)  NEGATIVE,Negative         pH (UA)  7.0  5 - 9         Protein  100 (A)  NEGATIVE,Negative mg/dl       Urobilinogen  0.2  0.0 - 1.0 EU/dl       Nitrites  Negative  NEGATIVE,Negative         Leukocyte Esterase  Moderate (A)  NEGATIVE,Negative         Color  Yellow          Appearance  Cloudy  POC URINE MICROSCOPIC          Collection Time: 12/24/17  7:27 PM         Result  Value  Ref Range            Epithelial cells, squamous  TNTC  /LPF       Transitional epithelial cells  OCCASIONAL  /LPF       WBC  15-29  /HPF            Bacteria  1+  /HPF           Imaging:  (All imaging personally reviewed by myself in conjunction with MANOLIO, Janine Ores, MD)   Ct Abd Pelv W Cont      Result Date: 12/24/2017   INDICATION: Abdominal pain COMPARISON: CT dated 08/27/2016 TECHNIQUE: CT of the abdomen and pelvis WITH intravenous contrast. DICOM format image data is available to non-affiliated external healthcare facilities or entities on a secure, media free, reciprocally  searchable basis with patient authorization for 12 months following the date of the study. Findings: Emphysematous changes involving the lower lungs. Liver, gallbladder, pancreas, and adrenal glands grossly unremarkable. Interval increase in size of a  1.3 cm low-attenuation splenic lesion (image 12 on series 2), previously measuring 1.1 cm. A few additional smaller splenic lesions are noted inferiorly, some, also slightly larger in size. Small nonobstructing bilateral renal calculi, measuring up to  6 mm in maximal dimension on the right and punctate on the left. A few scattered too small to characterize bilateral renal hypodensities. Prominent colonic diverticulosis without evidence of acute diverticulitis. A normal appendix. Stomach and small bowel  grossly unremarkable. A few scattered upper abdominal lymph nodes, which appeared the same  and are essentially not enlarged by CT criteria. A few small retroperitoneal and mesenteric lymph nodes, also unchanged. A narrow necked ventral abdominal wall  hernia just inferior to the left hepatic lobe, with the neck roughly measuring 1.1 cm and the hernia content measuring 2.2 cm. The hernia contains fat, which is likely related to the greater omentum. There is prominent stranding both involving the fat  within the ventral abdominal wall, just deep to the hernia, as well as the fat within the hernia sac. There is increased density surrounding the fat of the hernia content. Hysterectomy. Right hip prosthesis. Tiny fat-containing umbilical hernia.       IMPRESSION: 1. Small fat-containing ventral abdominal wall hernia with mild stranding of the fat. This may reflect mild inflammatory change or early findings related to fat ischemia/strangulation. Correlate clinically. 2. Scattered small splenic hypodensities,  slightly increased since the previous exam. The exact etiology is unknown. Findings may reflect multiple pseudocyst or hemangioma. 3. Colonic diverticulosis. No evidence of acute diverticulitis. 4. Hysterectomy. 5. Nonobstructing renal calculi.       [place] [facility]      Result Date: 12/24/2017   Examination: Ultrasound right upper quadrant. INDICATION: Pain. FINDINGS: The liver appears grossly unremarkable. No focal hepatic lesions definitely identified. The gallbladder appears unremarkable. No gallbladder wall thickening. No sonographic Murphy's  sign. CBD within normal limits roughly measuring 0.3 cm. Right kidney appears unremarkable and is normal in size. A 4 mm nonobstructing renal calculus. Tiny cyst on previous CT dated 08/27/2016, not well visualized on the current exam. Proximal aorta and  IVC appear unremarkable.       IMPRESSION: 1. No acute abnormalities. 2. No gallbladder wall thickening, gallstones or evidence of acute cholecystitis. 3. Nonobstructing right renal calculus.  ED Course         Patient Vitals for the past 12 hrs:            Temp  Pulse  Resp  BP  SpO2            12/24/17 2121  --  92  19  --  100 %            12/24/17 2102  --  94  17  (!) 139/102  100 %     12/24/17 2040  --  96  20  --  97 %     12/24/17 2014  --  (!) 110  21  (!) 159/107  92 %     12/24/17 2011  --  (!) 110  29  --  97 %     12/24/17 1925  98.4 ??F (36.9 ??C)  88  21  123/70  99 %     12/24/17 1830  --  89  19  --  98 %     12/24/17 1813  --  87  --  121/84  99 %     12/24/17 1741  --  93  18  116/77  98 %     12/24/17 1601  --  --  --  102/74  96 %            12/24/17 1446  98.2 ??F (36.8 ??C)  (!) 123  20  (!) 153/115  96 %             ED Course as of Dec 24 2125       Sun Dec 24, 2017        1926  1. Small fat-containing ventral abdominal wall hernia with mild stranding of the fat. This may reflect mild inflammatory change or early findings related  to fat ischemia/strangulation. Correlate clinically.   2. Scattered small splenic hypodensities, slightly increased since the previous exam. The exact etiology is unknown. Findings may reflect multiple pseudocyst or hemangioma.   3. Colonic diverticulosis. No evidence of acute diverticulitis.   4. Hysterectomy.   5. Nonobstructing renal calculi.    CT ABD PELV W CONT [SH]        2012  Went to reevaluate the patient.  She is having shortness of breath and tachypnea.  She is wheezing.  We will give her some Solu-Medrol and a DuoNeb  treatment.     [SH]              ED Course User Index   [SH] Hilton Sinclair, PA-C             Medications       ondansetron [facility]) injection 4 mg (4 mg IntraVENous Given 12/24/17 1815)     0.9% sodium chloride infusion (0 mL/hr IntraVENous IV Completed 12/24/17 1929)     morphine injection 4 mg (4 mg IntraVENous Given 12/24/17 1815)     sodium chloride 0.9 % bolus infusion 1,000 mL (1,000 mL IntraVENous New Bag 12/24/17 1927)     iopamidol (ISOVUE-370) 76 % injection 80 mL (80 mL IntraVENous Given 12/24/17 1848)     albuterol-ipratropium (DUO-NEB) 2.5  MG-0.5 MG/3 ML (3 mL Nebulization Given 12/24/17 2015)       methylPREDNISolone (PF) (Solu-MEDROL) injection 125 mg (125 mg IntraVENous Given 12/24/17 2015)             Medical Decision Making     Patient has had improvement in  her abdominal pain and improvement in her nausea as well.  She has no surgical reason for her abdominal pain seen on her labs  or imaging.  Her leukocytosis is likely secondary to her steroids which she was just on.  She has some leukoesterase on her UA but has no dysuria and her urinary symptoms have all resolved.  She did have another episode of some shortness of breath which  is common for her and it was relieved after a DuoNeb and she received 1 dose of steroids.  There was no sign of peritonitis or evidence  of other life-threatening or emergent etiology. The patient appears stable  for discharge and has been instructed to return immediately if the symptoms worsen or change in any way, or in 8-12 hours if not improved for re-evaluation. Abdominal pain warnings were given and the need for close follow-up  was emphasized.  Patient is advised to follow-up with her primary care provider or return to the emergency room if she is worsening of  symptoms.         Final Diagnosis                 ICD-10-CM  ICD-9-CM          1.  Nausea without vomiting  R11.0  787.02          2.  Abdominal pain, generalized  R10.84  789.07             Disposition     Discharge to home        Current Discharge Medication List              START taking these medications          Details        ondansetron (ZOFRAN ODT) 4 mg disintegrating tablet  Take 1 Tab by mouth every eight (8) hours as needed for Nausea. Indications: nausea   Qty: 12 Tab, Refills:  0                         Hilton Sinclair, MPA, PA-C   December 24, 2017   4:08 PM      The patient was personally evaluated by myself and MANOLIO, Janine Ores, MD who agrees with the above assessment and plan.      My signature above authenticates this document and my  orders, the final diagnosis(es), discharge prescription (s), and instructions in the Epic record. If you have any questions please contact [phone].       Nursing notes have been reviewed by the Physician/Advanced Practice Clinician. Dragon medical dictation software was used for portions of this report. Unintended voice recognition errors may occur.

## 2017-12-24 NOTE — ED Notes (Signed)
Pt arrived to ER from home with c/o HTN, headache, and abd pain that onset last night. Pt states she has been taking her HTN meds as rx'd and did take OTC meds for the headache with no relief.

## 2017-12-25 ENCOUNTER — Encounter

## 2017-12-25 LAB — POC URINE MICROSCOPIC

## 2017-12-25 MED ORDER — IPRATROPIUM-ALBUTEROL 2.5 MG-0.5 MG/3 ML NEB SOLUTION
2.5 mg-0.5 mg/3 ml | RESPIRATORY_TRACT | Status: DC
Start: 2017-12-25 — End: 2017-12-25

## 2017-12-25 MED ORDER — METHYLPREDNISOLONE (PF) 125 MG/2 ML IJ SOLR
125 mg/2 mL | Freq: Once | INTRAMUSCULAR | Status: AC
Start: 2017-12-25 — End: 2017-12-24
  Administered 2017-12-25: via INTRAVENOUS

## 2017-12-25 MED ORDER — IPRATROPIUM-ALBUTEROL 2.5 MG-0.5 MG/3 ML NEB SOLUTION
2.5 mg-0.5 mg/3 ml | RESPIRATORY_TRACT | Status: AC
Start: 2017-12-25 — End: 2017-12-24
  Administered 2017-12-25: via RESPIRATORY_TRACT

## 2017-12-25 MED ORDER — ONDANSETRON 4 MG TAB, RAPID DISSOLVE
4 mg | ORAL_TABLET | Freq: Three times a day (TID) | ORAL | 0 refills | Status: DC | PRN
Start: 2017-12-25 — End: 2019-03-13

## 2017-12-25 MED ORDER — METHYLPREDNISOLONE (PF) 125 MG/2 ML IJ SOLR
125 mg/2 mL | INTRAMUSCULAR | Status: DC
Start: 2017-12-25 — End: 2017-12-25

## 2017-12-25 MED FILL — SOLU-MEDROL (PF) 125 MG/2 ML SOLUTION FOR INJECTION: 125 mg/2 mL | INTRAMUSCULAR | Qty: 2

## 2017-12-25 MED FILL — IPRATROPIUM-ALBUTEROL 2.5 MG-0.5 MG/3 ML NEB SOLUTION: 2.5 mg-0.5 mg/3 ml | RESPIRATORY_TRACT | Qty: 3

## 2017-12-29 ENCOUNTER — Encounter

## 2018-01-02 MED ORDER — MONTELUKAST 10 MG TAB
10 mg | ORAL_TABLET | ORAL | 0 refills | Status: DC
Start: 2018-01-02 — End: 2018-02-18

## 2018-01-02 MED ORDER — DILTIAZEM ER 180 MG 24 HR CAP
180 mg | ORAL_CAPSULE | Freq: Every day | ORAL | 1 refills | Status: DC
Start: 2018-01-02 — End: 2018-09-07

## 2018-01-02 MED ORDER — SPIRONOLACTONE 100 MG TAB
100 mg | ORAL_TABLET | ORAL | 0 refills | Status: DC
Start: 2018-01-02 — End: 2018-04-25

## 2018-01-03 ENCOUNTER — Encounter

## 2018-01-03 MED ORDER — PEN NEEDLE, DIABETIC 32 GAUGE X 5/32"
32 gauge x 5/" | PEN_INJECTOR | 0 refills | Status: DC
Start: 2018-01-03 — End: 2018-08-05

## 2018-01-03 MED ORDER — INSULIN GLARGINE 100 UNIT/ML (3 ML) SUB-Q PEN
100 unit/mL (3 mL) | SUBCUTANEOUS | 0 refills | Status: DC
Start: 2018-01-03 — End: 2018-01-11

## 2018-01-05 ENCOUNTER — Encounter

## 2018-01-08 MED ORDER — GABAPENTIN 100 MG CAP
100 mg | ORAL_CAPSULE | ORAL | 2 refills | Status: DC
Start: 2018-01-08 — End: 2018-02-18

## 2018-01-08 MED ORDER — METFORMIN SR 500 MG 24 HR TABLET
500 mg | ORAL_TABLET | Freq: Two times a day (BID) | ORAL | 2 refills | Status: DC
Start: 2018-01-08 — End: 2018-03-26

## 2018-01-08 NOTE — Telephone Encounter (Signed)
Pharmacy is requiring a new script due to the medication being classified as a controlled substance.

## 2018-01-11 ENCOUNTER — Encounter: Attending: Physician Assistant | Primary: Physician Assistant

## 2018-01-15 ENCOUNTER — Inpatient Hospital Stay: Admit: 2018-01-15 | Payer: MEDICARE | Attending: Specialist | Primary: Physician Assistant

## 2018-01-15 DIAGNOSIS — J432 Centrilobular emphysema: Secondary | ICD-10-CM

## 2018-01-16 NOTE — Telephone Encounter (Signed)
Future Appointments   Date Time Provider Department Center   01/19/2018  1:30 PM Lennox GrumblesBrock, Monique, PA-C [facility]     Patient is completely out of the medication

## 2018-01-17 MED ORDER — INSULIN GLARGINE 100 UNIT/ML (3 ML) SUB-Q PEN
100 unit/mL (3 mL) | SUBCUTANEOUS | 0 refills | Status: DC
Start: 2018-01-17 — End: 2018-01-19

## 2018-01-19 ENCOUNTER — Ambulatory Visit: Attending: Physician Assistant | Primary: Physician Assistant

## 2018-01-19 ENCOUNTER — Encounter: Admit: 2018-01-19 | Primary: Physician Assistant

## 2018-01-19 ENCOUNTER — Ambulatory Visit: Admit: 2018-01-19 | Payer: MEDICARE | Attending: Physician Assistant | Primary: Physician Assistant

## 2018-01-19 DIAGNOSIS — E1140 Type 2 diabetes mellitus with diabetic neuropathy, unspecified: Secondary | ICD-10-CM

## 2018-01-19 DIAGNOSIS — M25551 Pain in right hip: Secondary | ICD-10-CM

## 2018-01-19 LAB — AMB POC HEMOGLOBIN A1C
Hemoglobin A1C, POC: 8.7 %
Hemoglobin A1c (POC): 8.7 %

## 2018-01-19 MED ORDER — KETOROLAC TROMETHAMINE 30 MG/ML INJECTION
30 mg/mL (1 mL) | Freq: Once | INTRAMUSCULAR | 0 refills | Status: AC
Start: 2018-01-19 — End: 2018-01-19

## 2018-01-19 MED ORDER — INSULIN GLARGINE 100 UNIT/ML (3 ML) SUB-Q PEN
100 unit/mL (3 mL) | Freq: Every evening | SUBCUTANEOUS | 2 refills | Status: DC
Start: 2018-01-19 — End: 2018-06-12

## 2018-01-19 NOTE — Progress Notes (Signed)
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 Needs follow up with Dr. Alcantara for further imaging.

## 2018-01-19 NOTE — Progress Notes (Signed)
LVM for pt's husband to return call, please see other xray result as well

## 2018-01-19 NOTE — Progress Notes (Signed)
LVM for pt's husband to return call

## 2018-01-19 NOTE — Patient Instructions (Signed)
Learning About Insulin Pens  What is an insulin pen?    An insulin pen is a device for giving insulin shots. It looks like a pen. Inside the pen is a needle and a cartridge filled with insulin. You can set the dose of insulin with a dial on the outside of the pen. You use the pen to give the insulin shot (injection). Both disposable and reusable insulin pens are available.  With a disposable pen, a set amount of insulin comes in the pen ready to use. When the insulin is used up, you throw the pen away. You use a new pen the next time you need insulin.  With a reusable pen, you don't throw the pen away. Instead, you reload the pen with a pre-measured cartridge of insulin. When the insulin is used up, you insert a new cartridge into the pen.  Disposable and reusable pens both need a new needle with each shot. The needles come in different lengths and widths. Shorter needles will prevent injecting into the muscle, especially in children or people who are lean. Thinner-width needles reduce the pricking sensation. Width is measured by gauge. The higher the number, the thinner the needle.  Why do some people prefer pens?  ?? Most people find that insulin pens are easier to use than a bottle and syringe.  ?? Many people feel less pain (or no pain) with the smaller insulin pen needle, compared to a syringe needle.  ?? Insulin pens may help you give yourself more accurate doses. When you draw insulin into a syringe, you must carefully measure so that you don't get too much or too little. But with a pen, you set a dial for the amount of insulin you want, and then you push the button.  ?? Insulin pens may work better than syringes for people who don't see well or who have problems like arthritis that make it harder to use a syringe.  ?? Using an insulin pen draws less attention from others. You can give yourself insulin with fewer people noticing.  ?? You don't need to carry insulin bottles and syringes everywhere you go.  An insulin pen fits into a pocket or purse.  What should you know about insulin pens?  Each pen delivers a different brand and type (or types) of insulin. Some deliver rapid-acting insulin. Others deliver long-acting insulin. And some pens deliver a mixture of both in one shot.  Pens have different colored labels, cartridge holders, or dosing knobs. Many pens have special features. For example, some pens have springs so that it takes less force to deliver a dose of insulin. Other pens have signals you can hear that let you know the insulin has been delivered. Some have memory to show the amount and time of the last dose.  How do you use an insulin pen?  1. For a reusable pen, put the insulin cartridge into the pen. Disposable pens already have an insulin cartridge. Follow the directions for how to screw a new needle onto your pen. Before using cloudy insulin, such as NPH and premixed insulin, gently roll the pen between your palms 10 times, then tip the pen up and down 10 times. Do not shake the pen. The insulin should look milky white.  2. Remove the outer cap from the needle. Keep this cap to use later.  3. Remove the inner cover from the needle. Be careful not to prick yourself.  4. Before each shot, prime the needle. Priming removes   air from the needle. Turn the dose knob to 2 units. Hold your pen with the needle pointing up. Tap the cartridge holder gently to move any air bubbles to the top. Push the injection button all the way in. Watch for a stream or drop of insulin to come out of the needle. If it does not, repeat this step again.  5. Clean the area of skin where you will give the shot. If you use alcohol to clean the skin, let it dry. Use a different spot each time you inject insulin. That's because using the same spot every time can cause bumps or pits to form in the skin. For example, inject your insulin above your belly button, then the next time use your upper thigh, and then the next  time inject below your belly button.  6. Turn the dose knob to the number of units of insulin you need to inject. Push the needle into your skin. Most people can inject using a 90-degree angle and without pinching the skin. Adults and children who are very lean and people who use longer needles may need to pinch the skin to avoid injecting into muscle.  7. Put your thumb on the injection button and push it in until it stops. Keep the pen in your skin. Hold the dose knob in for 10 seconds (or to the number that the manufacturer recommends). Then pull the needle out of your skin. Do not rub the area.  8. Put only the outer cap back over the needle. The thin inner cover is harder to put back on, and you could stick yourself.  9. After covering the needle with the outer cap, unscrew the needle and throw it away in a sharps container or other solid plastic container. You can get a sharps container at your drugstore.  10. Always read the insulin package information that tells the best way to store your insulin pen and insulin cartridges. In general, unopened insulin for pens will last longer if it is kept in the refrigerator. After insulin is opened, most manufacturers say to store it at room temperature.  Don't share insulin pens with anyone else who uses insulin. Even when the needle is changed, an insulin pen can carry bacteria or blood that can make another person sick.  Where can you learn more?  Go to http://www.healthwise.net/[facility].  Enter M910 in the search box to learn more about "Learning About Insulin Pens."  Current as of: January 04, 2017  Content Version: 12.1  ?? 2006-2019 [employer]. Care instructions adapted under license by [employer] (which disclaims liability or warranty for this information). If you have questions about a medical condition or this instruction, always ask your healthcare professional. [employer]wise[employer]  Incorpor[employer] disclaims any warranty or liability for your use of this information.

## 2018-01-19 NOTE — Progress Notes (Signed)
Mr. Goodwin, pt's husband, returned call, given results and voiced understanding.

## 2018-01-19 NOTE — Progress Notes (Signed)
HISTORY OF PRESENT ILLNESS  [name] [name] is a 67 y.o. female.  HPI  [name] [name] is a 67 y.o. female who presents to the office today for Diabetes  She comes in today with her husband for follow-up on diabetes  She has been taking her metformin 500 mg twice daily without skipping dosages.  She is also administering 28 units of Lantus at bedtime.  She is monitoring her blood sugars at home.  Review of her glucometer shows her blood sugar levels have been running very well typically between the 70s and low 100s. Last A1c was >11.  She is still complaining of chronic right hip and right leg pain.She was seeing Dr. Carollee Massed back in 2017 for her lumbar pain with radiation down her right leg.  She underwent right side SI joint injection.  This initially provided relief for about 1 week only. Dr. Carollee Massed at one point suggested a lumbar MRI but I do not see where this was done.  She tells me that she lays in bed most of the day or sits in a chair because she cannot walk around due to the pain in her right hip and right leg.  Her husband is concerned that her issue lies in the lumbar spine more so than in the right hip.  She does have tingling in both feet       Chief Complaint   Patient presents with   ??? Diabetes       Current Outpatient Medications on File Prior to Visit   Medication Sig Dispense Refill   ??? metFORMIN ER (GLUCOPHAGE XR) 500 mg tablet Take 1 Tab by mouth two (2) times a day. 60 Tab 2   ??? gabapentin (NEURONTIN) 100 mg capsule TAKE 1 CAPSULE BY MOUTH THREE TIMES DAILY 90 Cap 2   ??? Insulin Needles, Disposable, (NANO PEN NEEDLE) 32 gauge x 5/32" ndle To use with Lantus pen 100 Pen Needle 0   ??? dilTIAZem CD (CARDIZEM CD) 180 mg ER capsule Take 1 Cap by mouth daily. 90 Cap 1   ??? montelukast (SINGULAIR) 10 mg tablet TAKE 1 TABLET BY MOUTH DAILY 90 Tab 0   ??? spironolactone (ALDACTONE) 100 mg tablet TAKE 1 TABLET BY MOUTH DAILY 90 Tab 0    ??? ondansetron (ZOFRAN ODT) 4 mg disintegrating tablet Take 1 Tab by mouth every eight (8) hours as needed for Nausea. Indications: nausea 12 Tab 0   ??? albuterol (ACCUNEB) 1.25 mg/3 mL nebu 3 mL by Nebulization route every six (6) hours as needed (SOB, wheezing). Indications: Asthma Attack 100 Each 0   ??? albuterol-ipratropium (DUO-NEB) 2.5 mg-0.5 mg/3 ml nebu 3 mL by Nebulization route.     ??? Blood Glucose Control High&Low (ACCU-CHEK AVIVA CONTROL SOLN) soln Check blood sugar daily 1 Bottle 0   ??? Blood-Glucose Meter (ACCU-CHEK AVIVA PLUS METER) misc Check blood sugar daily 1 Each 0   ??? glucose blood VI test strips (ACCU-CHEK AVIVA PLUS TEST STRP) strip Check blood sugar daily 100 Strip 5   ??? alcohol swabs (BD SINGLE USE SWABS REGULAR) padm Check blood sugar daily 100 Pad 5   ??? lancets (ACCU-CHEK SOFTCLIX LANCETS) misc Check blood sugar daily 100 Each 5   ??? tiotropium bromide (SPIRIVA RESPIMAT) 2.5 mcg/actuation inhaler Take 1 Puff by inhalation daily. 1 Inhaler 0   ??? cloNIDine HCl (CATAPRES) 0.3 mg tablet Take bid (Patient taking differently: Take 1 Tab by mouth two (2) times a day. Take bid) 60 Tab 1   ???  fluticasone propion-salmeterol (ADVAIR DISKUS) 100-50 mcg/dose diskus inhaler Take 1 Puff by inhalation two (2) times a day. 1 Inhaler 0   ??? magnesium oxide (MAG-OX) 400 mg tablet TAKE 1 TABLET BY MOUTH DAILY 90 Tab 0   ??? traZODone (DESYREL) 50 mg tablet Take 1 Tab by mouth nightly as needed for Sleep. 20 Tab 0   ??? losartan (COZAAR) 100 mg tablet TAKE 1 TABLET BY MOUTH DAILY FOR HYPERTENSION 90 Tab 0   ??? QUEtiapine SR (SEROQUEL XR) 150 mg sr tablet TAKE 1 TABLET BY MOUTH NIGHTLY 90 Tab 1   ??? rosuvastatin (CRESTOR) 5 mg tablet TAKE 1 TABLET BY MOUTH NIGHTLY 90 Tab 1   ??? polyethylene glycol (MIRALAX) 17 gram packet Take 1 Packet by mouth daily. 100 Packet 1   ??? triamcinolone (NASACORT) 55 mcg nasal inhaler 2 Sprays by Both Nostrils route daily. 1 Bottle 0    ??? Blood-Glucose Meter monitoring kit Check sugars 2 times per day, fasting and 2 hours after dinner 1 Kit 0   ??? Lancets misc Check sugars twice daily 200 Each 2   ??? glucose blood VI test strips (ASCENSIA AUTODISC VI, ONE TOUCH ULTRA TEST VI) strip Check sugars twice daily 100 Strip 5   ??? nabumetone (RELAFEN) 750 mg tablet Take 750 mg by mouth two (2) times a day.       No current facility-administered medications on file prior to visit.      Allergies   Allergen Reactions   ??? Chocolate [Cocoa] Sneezing     Past Medical History:   Diagnosis Date   ??? Arthritis    ??? 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 COPD (chronic obstructive pulmonary disease) ([place])    ??? DM2 (diabetes mellitus, type 2) ([place])    ??? GERD (gastroesophageal reflux disease)    ??? Hepatitis C     HEP C   ??? HTN (hypertension)    ??? Lung nodule, multiple     (CT 10/2016) Small left upper lobe and 1.7 x 1.6 cm left lower lobe nodules not significantly changed from 07/23/2016 and 03/22/2016   ??? Marijuana use    ??? PTSD (post-traumatic stress disorder)     lived through the [place] in 1993   ??? Thoracic ascending aortic aneurysm ([place])     4.4cm noted on CT Chest (10/2016)   ??? Thyroid nodule     2.5 cm stable right thyroid nodule (CT 10/2016)     Social History     Tobacco Use   Smoking Status Former Smoker   Smokeless Tobacco Never Used   Tobacco Comment    reported as quit smoking around 1990s per spouse     Social History     Substance and Sexual Activity   Alcohol Use No     Family History   Problem Relation Age of Onset   ??? Hypertension Mother    ??? Other Mother         OSA   ??? Cancer Father         suspected leukemia per pt's spouse   ??? Cancer Maternal Aunt    ??? Alcohol abuse Maternal Grandfather      Review of Systems   Constitutional: Negative for fever, malaise/fatigue and weight loss.   Respiratory: Negative for sputum production and shortness of breath.     Cardiovascular: Negative for chest pain, palpitations, orthopnea and leg swelling.   Gastrointestinal: Negative  for nausea and vomiting.   Genitourinary: Negative for frequency.   Musculoskeletal: Positive for back pain and joint pain.        Chronic low back pain, right hip and right leg pain   Neurological: Positive for tingling. Negative for dizziness and loss of consciousness.        Toes   Endo/Heme/Allergies: Negative for polydipsia.     Visit Vitals  BP 120/88 Comment: recheck manual left arm   Pulse 85   Temp 97.5 ??F (36.4 ??C) (Oral)   Resp 18   Ht 5' 10"  (1.778 m)   Wt 178 lb (80.7 kg)   SpO2 95%   BMI 25.54 kg/m??     Physical Exam   Constitutional: She is oriented to person, place, and time. She appears well-developed and well-nourished. No distress.   Sitting in wheelchair    Cardiovascular: Normal rate, regular rhythm and normal heart sounds.   Pulmonary/Chest: Effort normal and breath sounds normal. No respiratory distress. She has no wheezes. She has no rales.   Musculoskeletal: She exhibits no edema.   Neurological: She is alert and oriented to person, place, and time.   Psychiatric: She has a normal mood and affect. Her behavior is normal. Thought content normal.   Nursing note and vitals reviewed.    No results found for this or any previous visit (from the past 12 hour(s)).  ASSESSMENT and PLAN    ICD-10-CM ICD-9-CM    1. Type 2 diabetes mellitus with diabetic neuropathy, without long-term current use of insulin (HCC) E11.40 250.60 AMB POC HEMOGLOBIN A1C     357.2 insulin glargine (LANTUS SOLOSTAR U-100 INSULIN) 100 unit/mL (3 mL) inpn   2. Right hip pain M25.551 719.45 ketorolac (TORADOL) 30 mg/mL (1 mL) injection      XR SPINE LUMB MIN 4 V      XR HIP RT W OR WO PELV 2-3 VWS   3. Chronic pain of right lower extremity M79.604 729.5 XR SPINE LUMB MIN 4 V    G89.29 338.29 XR HIP RT W OR WO PELV 2-3 VWS   4. Spine pain, lumbar M54.5 724.2 ketorolac (TORADOL) 30 mg/mL (1 mL) injection       XR SPINE LUMB MIN 4 V      XR HIP RT W OR WO PELV 2-3 VWS   5. Essential hypertension with goal blood pressure less than 140/90 I10 401.9       A1c improved down to 8.7.  I have asked that she increase her Lantus to 32 units nightly, she will administer 30 units at bedtime for the next 2 nights and then go up to 32 units thereafter.  Toradol injection in the office today for her hip pain and leg pain.  I will repeat her lumbar spine and right hip x-rays today in the office.  I will also have her follow-up with Dr. Carollee Massed again regarding her chronic low back pain.  Blood pressure well controlled on current medication.  Reviewed medication and side effects. Patient agrees with the plan and verbalizes understanding.   Follow-up and Dispositions    ?? Return in about 6 weeks (around 03/02/2018) for nurse visit for A1c check.       Wallene Huh, PA-C  01/19/2018    PLEASE NOTE:  This document has been produced using voice recognition software. Unrecognized errors in transcription may be present.

## 2018-01-19 NOTE — Progress Notes (Signed)
[name] [name] is a 67 y.o. female here for DM follow up.

## 2018-01-19 NOTE — Progress Notes (Signed)
Right hip prosthetic looks stable

## 2018-01-19 NOTE — Progress Notes (Signed)
[name] [name], pt's husband, returned call, given results and voiced understanding.

## 2018-01-19 NOTE — Progress Notes (Signed)
HISTORY OF PRESENT ILLNESS  [name] [name] is a 67 y.o. female.  HPI  [name] [name] is a 67 y.o. female who presents to the office today for Diabetes  She comes in today with her husband for follow-up on diabetes  She has been taking her metformin 500 mg twice daily without skipping dosages.  She is also administering 28 units of Lantus at bedtime.  She is monitoring her blood sugars at home.  Review of her glucometer shows her blood sugar levels have been running very well typically between the 70s and low 100s. Last A1c was >11.  She is still complaining of chronic right hip and right leg pain.She was seeing Dr. Carollee Massed back in 2017 for her lumbar pain with radiation down her right leg.  She underwent right side SI joint injection.  This initially provided relief for about 1 week only. Dr. Carollee Massed at one point suggested a lumbar MRI but I do not see where this was done.  She tells me that she lays in bed most of the day or sits in a chair because she cannot walk around due to the pain in her right hip and right leg.  Her husband is concerned that her issue lies in the lumbar spine more so than in the right hip.  She does have tingling in both feet       Chief Complaint   Patient presents with   ??? Diabetes       Current Outpatient Medications on File Prior to Visit   Medication Sig Dispense Refill   ??? metFORMIN ER (GLUCOPHAGE XR) 500 mg tablet Take 1 Tab by mouth two (2) times a day. 60 Tab 2   ??? gabapentin (NEURONTIN) 100 mg capsule TAKE 1 CAPSULE BY MOUTH THREE TIMES DAILY 90 Cap 2   ??? Insulin Needles, Disposable, (NANO PEN NEEDLE) 32 gauge x 5/32" ndle To use with Lantus pen 100 Pen Needle 0   ??? dilTIAZem CD (CARDIZEM CD) 180 mg ER capsule Take 1 Cap by mouth daily. 90 Cap 1   ??? montelukast (SINGULAIR) 10 mg tablet TAKE 1 TABLET BY MOUTH DAILY 90 Tab 0   ??? spironolactone (ALDACTONE) 100 mg tablet TAKE 1 TABLET BY MOUTH DAILY 90 Tab 0   ??? ondansetron (ZOFRAN ODT) 4 mg disintegrating tablet Take 1 Tab by  mouth every eight (8) hours as needed for Nausea. Indications: nausea 12 Tab 0   ??? albuterol (ACCUNEB) 1.25 mg/3 mL nebu 3 mL by Nebulization route every six (6) hours as needed (SOB, wheezing). Indications: Asthma Attack 100 Each 0   ??? albuterol-ipratropium (DUO-NEB) 2.5 mg-0.5 mg/3 ml nebu 3 mL by Nebulization route.     ??? Blood Glucose Control High&Low (ACCU-CHEK AVIVA CONTROL SOLN) soln Check blood sugar daily 1 Bottle 0   ??? Blood-Glucose Meter (ACCU-CHEK AVIVA PLUS METER) misc Check blood sugar daily 1 Each 0   ??? glucose blood VI test strips (ACCU-CHEK AVIVA PLUS TEST STRP) strip Check blood sugar daily 100 Strip 5   ??? alcohol swabs (BD SINGLE USE SWABS REGULAR) padm Check blood sugar daily 100 Pad 5   ??? lancets (ACCU-CHEK SOFTCLIX LANCETS) misc Check blood sugar daily 100 Each 5   ??? tiotropium bromide (SPIRIVA RESPIMAT) 2.5 mcg/actuation inhaler Take 1 Puff by inhalation daily. 1 Inhaler 0   ??? cloNIDine HCl (CATAPRES) 0.3 mg tablet Take bid (Patient taking differently: Take 1 Tab by mouth two (2) times a day. Take bid) 60 Tab 1   ???  fluticasone propion-salmeterol (ADVAIR DISKUS) 100-50 mcg/dose diskus inhaler Take 1 Puff by inhalation two (2) times a day. 1 Inhaler 0   ??? magnesium oxide (MAG-OX) 400 mg tablet TAKE 1 TABLET BY MOUTH DAILY 90 Tab 0   ??? traZODone (DESYREL) 50 mg tablet Take 1 Tab by mouth nightly as needed for Sleep. 20 Tab 0   ??? losartan (COZAAR) 100 mg tablet TAKE 1 TABLET BY MOUTH DAILY FOR HYPERTENSION 90 Tab 0   ??? QUEtiapine SR (SEROQUEL XR) 150 mg sr tablet TAKE 1 TABLET BY MOUTH NIGHTLY 90 Tab 1   ??? rosuvastatin (CRESTOR) 5 mg tablet TAKE 1 TABLET BY MOUTH NIGHTLY 90 Tab 1   ??? polyethylene glycol (MIRALAX) 17 gram packet Take 1 Packet by mouth daily. 100 Packet 1   ??? triamcinolone (NASACORT) 55 mcg nasal inhaler 2 Sprays by Both Nostrils route daily. 1 Bottle 0   ??? Blood-Glucose Meter monitoring kit Check sugars 2 times per day, fasting and 2 hours after dinner 1 Kit 0   ??? Lancets misc  Check sugars twice daily 200 Each 2   ??? glucose blood VI test strips (ASCENSIA AUTODISC VI, ONE TOUCH ULTRA TEST VI) strip Check sugars twice daily 100 Strip 5   ??? nabumetone (RELAFEN) 750 mg tablet Take 750 mg by mouth two (2) times a day.       No current facility-administered medications on file prior to visit.      Allergies   Allergen Reactions   ??? Chocolate [Cocoa] Sneezing     Past Medical History:   Diagnosis Date   ??? Arthritis    ??? 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 COPD (chronic obstructive pulmonary disease) ([place])    ??? DM2 (diabetes mellitus, type 2) ([place])    ??? GERD (gastroesophageal reflux disease)    ??? Hepatitis C     HEP C   ??? HTN (hypertension)    ??? Lung nodule, multiple     (CT 10/2016) Small left upper lobe and 1.7 x 1.6 cm left lower lobe nodules not significantly changed from 07/23/2016 and 03/22/2016   ??? Marijuana use    ??? PTSD (post-traumatic stress disorder)     lived through the [place] in 1993   ??? Thoracic ascending aortic aneurysm ([place])     4.4cm noted on CT Chest (10/2016)   ??? Thyroid nodule     2.5 cm stable right thyroid nodule (CT 10/2016)     Social History     Tobacco Use   Smoking Status Former Smoker   Smokeless Tobacco Never Used   Tobacco Comment    reported as quit smoking around 1990s per spouse     Social History     Substance and Sexual Activity   Alcohol Use No     Family History   Problem Relation Age of Onset   ??? Hypertension Mother    ??? Other Mother         OSA   ??? Cancer Father         suspected leukemia per pt's spouse   ??? Cancer Maternal Aunt    ??? Alcohol abuse Maternal Grandfather      Review of Systems   Constitutional: Negative for fever, malaise/fatigue and weight loss.   Respiratory: Negative for sputum production and shortness of breath.    Cardiovascular: Negative for chest pain, palpitations, orthopnea and leg swelling.   Gastrointestinal: Negative for  nausea and vomiting.   Genitourinary: Negative for frequency.    Musculoskeletal: Positive for back pain and joint pain.        Chronic low back pain, right hip and right leg pain   Neurological: Positive for tingling. Negative for dizziness and loss of consciousness.        Toes   Endo/Heme/Allergies: Negative for polydipsia.     Visit Vitals  BP 120/88 Comment: recheck manual left arm   Pulse 85   Temp 97.5 ??F (36.4 ??C) (Oral)   Resp 18   Ht '5\' 10"'  (1.778 m)   Wt 178 lb (80.7 kg)   SpO2 95%   BMI 25.54 kg/m??     Physical Exam   Constitutional: She is oriented to person, place, and time. She appears well-developed and well-nourished. No distress.   Sitting in wheelchair    Cardiovascular: Normal rate, regular rhythm and normal heart sounds.   Pulmonary/Chest: Effort normal and breath sounds normal. No respiratory distress. She has no wheezes. She has no rales.   Musculoskeletal: She exhibits no edema.   Neurological: She is alert and oriented to person, place, and time.   Psychiatric: She has a normal mood and affect. Her behavior is normal. Thought content normal.   Nursing note and vitals reviewed.    No results found for this or any previous visit (from the past 12 hour(s)).  ASSESSMENT and PLAN    ICD-10-CM ICD-9-CM    1. Type 2 diabetes mellitus with diabetic neuropathy, without long-term current use of insulin (HCC) E11.40 250.60 AMB POC HEMOGLOBIN A1C     357.2 insulin glargine (LANTUS SOLOSTAR U-100 INSULIN) 100 unit/mL (3 mL) inpn   2. Right hip pain M25.551 719.45 ketorolac (TORADOL) 30 mg/mL (1 mL) injection      XR SPINE LUMB MIN 4 V      XR HIP RT W OR WO PELV 2-3 VWS   3. Chronic pain of right lower extremity M79.604 729.5 XR SPINE LUMB MIN 4 V    G89.29 338.29 XR HIP RT W OR WO PELV 2-3 VWS   4. Spine pain, lumbar M54.5 724.2 ketorolac (TORADOL) 30 mg/mL (1 mL) injection      XR SPINE LUMB MIN 4 V      XR HIP RT W OR WO PELV 2-3 VWS   5. Essential hypertension with goal blood pressure less than 140/90 I10 401.9       A1c improved down to 8.7.  I have asked that  she increase her Lantus to 32 units nightly, she will administer 30 units at bedtime for the next 2 nights and then go up to 32 units thereafter.  Toradol injection in the office today for her hip pain and leg pain.  I will repeat her lumbar spine and right hip x-rays today in the office.  I will also have her follow-up with Dr. Carollee Massed again regarding her chronic low back pain.  Blood pressure well controlled on current medication.  Reviewed medication and side effects. Patient agrees with the plan and verbalizes understanding.   Follow-up and Dispositions    ?? Return in about 6 weeks (around 03/02/2018) for nurse visit for A1c check.       Wallene Huh, PA-C  01/19/2018    PLEASE NOTE:  This document has been produced using voice recognition software. Unrecognized errors in transcription may be present.

## 2018-01-19 NOTE — Progress Notes (Signed)
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 Needs follow up with Dr. Tana CoastAlcantara for further imaging.

## 2018-01-19 NOTE — Progress Notes (Signed)
Mr. Geraldo PitterGoodwin, pt's husband, returned call, given results and voiced understanding.

## 2018-01-19 NOTE — Progress Notes (Signed)
Right hip prosthetic looks stable

## 2018-01-19 NOTE — Progress Notes (Signed)
LVM for pt's husband to return call, please see other xray result as well

## 2018-01-19 NOTE — Progress Notes (Signed)
[name] [name] is a 67 y.o. female here for DM follow up.

## 2018-01-29 ENCOUNTER — Emergency Department: Admit: 2018-01-29 | Payer: MEDICARE | Primary: Physician Assistant

## 2018-01-29 ENCOUNTER — Inpatient Hospital Stay
Admit: 2018-01-29 | Discharge: 2018-01-29 | Disposition: A | Discharge status: Home / Self Care | Payer: MEDICARE | Attending: Emergency Medicine

## 2018-01-29 DIAGNOSIS — K436 Other and unspecified ventral hernia with obstruction, without gangrene: Secondary | ICD-10-CM

## 2018-01-29 LAB — POC URINE MACROSCOPIC
Bilirubin, Urine: NEGATIVE
Bilirubin: NEGATIVE
Glucose, Ur: NEGATIVE mg/dl
Glucose: NEGATIVE mg/dl
Nitrite, Urine: NEGATIVE
Nitrites: NEGATIVE
Protein, UA: 30 mg/dl — AB
Protein: 30 mg/dl — AB
Specific Gravity, UA: 1.02 (ref 1.005–1.030)
Specific gravity: 1.02 (ref 1.005–1.030)
Urobilinogen, UA, POCT: 0.2 EU/dl (ref 0.0–1.0)
Urobilinogen: 0.2 EU/dl (ref 0.0–1.0)
pH (UA): 7 (ref 5–9)
pH, UA: 7 (ref 5–9)

## 2018-01-29 LAB — CBC WITH AUTO DIFFERENTIAL
Basophils %: 0.3 % (ref 0–3)
Eosinophils %: 0.4 % (ref 0–5)
Hematocrit: 40.9 % (ref 37.0–50.0)
Hemoglobin: 13.4 gm/dl (ref 13.0–17.2)
Immature Granulocytes: 0.4 % (ref 0.0–3.0)
Lymphocytes %: 15.1 % — ABNORMAL LOW (ref 28–48)
MCH: 28.9 pg (ref 25.4–34.6)
MCHC: 32.8 gm/dl (ref 30.0–36.0)
MCV: 88.1 fL (ref 80.0–98.0)
MPV: 10.9 fL — ABNORMAL HIGH (ref 6.0–10.0)
Monocytes %: 8.3 % (ref 1–13)
Neutrophils %: 75.5 % — ABNORMAL HIGH (ref 34–64)
Nucleated RBCs: 0 (ref 0–0)
Platelets: 254 10*3/uL (ref 140–450)
RBC: 4.64 M/uL (ref 3.60–5.20)
RDW-SD: 44.8 (ref 36.4–46.3)
WBC: 12.4 10*3/uL — ABNORMAL HIGH (ref 4.0–11.0)

## 2018-01-29 LAB — POC CHEM8
BUN: 17 mg/dl (ref 7–25)
BUN: 17 mg/dl (ref 7–25)
CALCIUM,IONIZED: 4.6 mg/dL (ref 4.40–5.40)
CALCIUM,IONIZED: 4.6 mg/dL (ref 4.40–5.40)
CO2 Total: 27 mmol/L (ref 21–32)
CO2, TOTAL: 27 mmol/L (ref 21–32)
Chloride: 101 mEq/L (ref 98–107)
Chloride: 101 mEq/L (ref 98–107)
Creatinine: 1 mg/dl (ref 0.6–1.3)
Creatinine: 1 mg/dl (ref 0.6–1.3)
Glucose: 109 mg/dL — ABNORMAL HIGH (ref 74–106)
Glucose: 109 mg/dL — ABNORMAL HIGH (ref 74–106)
HCT: 43 % (ref 38–45)
HGB: 14.6 gm/dl (ref 12.4–17.2)
Hematocrit: 43 % (ref 38–45)
Hemoglobin: 14.6 gm/dl (ref 12.4–17.2)
Potassium: 4.2 mEq/L (ref 3.5–4.9)
Potassium: 4.2 mEq/L (ref 3.5–4.9)
Sodium: 140 mEq/L (ref 136–145)
Sodium: 140 mEq/L (ref 136–145)

## 2018-01-29 LAB — COMPREHENSIVE METABOLIC PANEL
ALT: 12 U/L (ref 12–78)
AST: <3 U/L — ABNORMAL LOW (ref 15–37)
Albumin: 3.6 gm/dl (ref 3.4–5.0)
Alkaline Phosphatase: 78 U/L (ref 45–117)
Anion Gap: 8 mmol/L (ref 5–15)
BUN: 17 mg/dl (ref 7–25)
CO2: 28 mEq/L (ref 21–32)
Calcium: 9.5 mg/dl (ref 8.5–10.1)
Chloride: 103 mEq/L (ref 98–107)
Creatinine: 1 mg/dl (ref 0.6–1.3)
EGFR IF NonAfrican American: 59
GFR African American: >60
Glucose: 106 mg/dl (ref 74–106)
Potassium: 4.2 mEq/L (ref 3.5–5.1)
Sodium: 140 mEq/L (ref 136–145)
Total Bilirubin: 0.6 mg/dl (ref 0.2–1.0)
Total Protein: 8.3 gm/dl — ABNORMAL HIGH (ref 6.4–8.2)

## 2018-01-29 LAB — POC URINE MICROSCOPIC

## 2018-01-29 LAB — LACTIC ACID
LACTIC ACID: 0.9 mmol/L (ref 0.4–2.0)
Lactic Acid: 0.9 mmol/L (ref 0.4–2.0)

## 2018-01-29 LAB — LIPASE
Lipase: 65 U/L — ABNORMAL LOW (ref 73–393)
Lipase: 65 U/L — ABNORMAL LOW (ref 73–393)

## 2018-01-29 LAB — METABOLIC PANEL, COMPREHENSIVE
ALT (SGPT): 12 U/L (ref 12–78)
AST (SGOT): <3 U/L — ABNORMAL LOW (ref 15–37)
Albumin: 3.6 gm/dl (ref 3.4–5.0)
Alk. phosphatase: 78 U/L (ref 45–117)
Anion gap: 8 mmol/L (ref 5–15)
BUN: 17 mg/dl (ref 7–25)
Bilirubin, total: 0.6 mg/dl (ref 0.2–1.0)
CO2: 28 mEq/L (ref 21–32)
Calcium: 9.5 mg/dl (ref 8.5–10.1)
Chloride: 103 mEq/L (ref 98–107)
Creatinine: 1 mg/dl (ref 0.6–1.3)
GFR est AA: >60
GFR est non-AA: 59
Glucose: 106 mg/dl (ref 74–106)
Potassium: 4.2 mEq/L (ref 3.5–5.1)
Protein, total: 8.3 gm/dl — ABNORMAL HIGH (ref 6.4–8.2)
Sodium: 140 mEq/L (ref 136–145)

## 2018-01-29 LAB — CBC WITH AUTOMATED DIFF
BASOPHILS: 0.3 % (ref 0–3)
EOSINOPHILS: 0.4 % (ref 0–5)
HCT: 40.9 % (ref 37.0–50.0)
HGB: 13.4 gm/dl (ref 13.0–17.2)
IMMATURE GRANULOCYTES: 0.4 % (ref 0.0–3.0)
LYMPHOCYTES: 15.1 % — ABNORMAL LOW (ref 28–48)
MCH: 28.9 pg (ref 25.4–34.6)
MCHC: 32.8 gm/dl (ref 30.0–36.0)
MCV: 88.1 fL (ref 80.0–98.0)
MONOCYTES: 8.3 % (ref 1–13)
MPV: 10.9 fL — ABNORMAL HIGH (ref 6.0–10.0)
NEUTROPHILS: 75.5 % — ABNORMAL HIGH (ref 34–64)
NRBC: 0 (ref 0–0)
PLATELET: 254 10*3/uL (ref 140–450)
RBC: 4.64 M/uL (ref 3.60–5.20)
RDW-SD: 44.8 (ref 36.4–46.3)
WBC: 12.4 10*3/uL — ABNORMAL HIGH (ref 4.0–11.0)

## 2018-01-29 MED ORDER — HYDROMORPHONE 1 MG/ML INJECTION SOLUTION
1 mg/mL | INTRAMUSCULAR | Status: AC
Start: 2018-01-29 — End: 2018-01-29
  Administered 2018-01-29: 20:00:00 via INTRAVENOUS

## 2018-01-29 MED ORDER — IOPAMIDOL 76 % IV SOLN
370 mg iodine /mL (76 %) | Freq: Once | INTRAVENOUS | Status: AC
Start: 2018-01-29 — End: 2018-01-29
  Administered 2018-01-29: 17:00:00 via INTRAVENOUS

## 2018-01-29 MED ORDER — OXYCODONE-ACETAMINOPHEN 5 MG-325 MG TAB
5-325 mg | ORAL_TABLET | Freq: Four times a day (QID) | ORAL | 0 refills | Status: AC | PRN
Start: 2018-01-29 — End: 2018-02-01

## 2018-01-29 MED ORDER — MORPHINE 4 MG/ML SYRINGE
4 mg/mL | INTRAMUSCULAR | Status: AC
Start: 2018-01-29 — End: 2018-01-29
  Administered 2018-01-29: 16:00:00 via INTRAVENOUS

## 2018-01-29 MED ORDER — SODIUM CHLORIDE 0.9 % IJ SYRG
Freq: Once | INTRAMUSCULAR | Status: AC
Start: 2018-01-29 — End: 2018-01-29
  Administered 2018-01-29: 16:00:00 via INTRAVENOUS

## 2018-01-29 MED ORDER — ONDANSETRON (PF) 4 MG/2 ML INJECTION
4 mg/2 mL | Freq: Once | INTRAMUSCULAR | Status: AC
Start: 2018-01-29 — End: 2018-01-29
  Administered 2018-01-29: 16:00:00 via INTRAVENOUS

## 2018-01-29 MED ORDER — IPRATROPIUM-ALBUTEROL 2.5 MG-0.5 MG/3 ML NEB SOLUTION
2.5 mg-0.5 mg/3 ml | RESPIRATORY_TRACT | Status: AC
Start: 2018-01-29 — End: 2018-01-29
  Administered 2018-01-29: 16:00:00 via RESPIRATORY_TRACT

## 2018-01-29 MED FILL — ISOVUE-370  76 % INTRAVENOUS SOLUTION: 370 mg iodine /mL (76 %) | INTRAVENOUS | Qty: 80

## 2018-01-29 MED FILL — HYDROMORPHONE 1 MG/ML INJECTION SOLUTION: 1 mg/mL | INTRAMUSCULAR | Qty: 1

## 2018-01-29 MED FILL — ONDANSETRON (PF) 4 MG/2 ML INJECTION: 4 mg/2 mL | INTRAMUSCULAR | Qty: 2

## 2018-01-29 MED FILL — IPRATROPIUM-ALBUTEROL 2.5 MG-0.5 MG/3 ML NEB SOLUTION: 2.5 mg-0.5 mg/3 ml | RESPIRATORY_TRACT | Qty: 3

## 2018-01-29 MED FILL — MORPHINE 4 MG/ML SYRINGE: 4 mg/mL | INTRAMUSCULAR | Qty: 1

## 2018-01-29 NOTE — Other (Addendum)
Lab  The patient had a urine culture returned positive for over 100,000 enterococcus bacillus This was given to Ollen GrossBryan Hendricks, PA for review.  A prescription for Macrobid has been written.  I called and I spoke with the patient and her husband.  They asked for the [facility] to be [facility] on [address].  The patient will follow-up with Lennox GrumblesMonique Brock and I will fax the report over to the office for Ms. Mal AmabileBrock to review.  The patient's husband called to say that when he went to the pharmacy the prescription was not there and they said they did not realize if I called it in.  It had been called in at 10:38 AM.  I called the prescription in and Spoke to VikingJustin and told him we called in the morning and that the patient was waiting for it.

## 2018-01-29 NOTE — ED Notes (Signed)
4:57 PM  01/29/18     Discharge instructions given to patient (name) with verbalization of understanding. Patient accompanied by father.  Patient discharged with the following prescriptions percocet. Patient discharged to home (destination).      Lyn David, RN

## 2018-01-29 NOTE — ED Triage Notes (Signed)
Started yesterday with middle area abd pain

## 2018-01-29 NOTE — ED Provider Notes (Signed)
[facility]  Emergency Department Treatment Report    Patient: [name] [name] Age: 67 y.o. Sex: female    Date of Birth: Nov 06, 1950 Admit Date: 01/29/2018 PCP: Ernst Breach   MRN: [id number]  CSN: [id number]  ATTENDING: Loni Muse, MD   Room: ER35/ER35 Time Dictated: 1:12 PM APP: Hollie Salk, PA-C     Chief Complaint   Chief Complaint   Patient presents with   ??? Abdominal Pain       History of Present Illness   67 y.o. female with a history of COPD, DM2, HTN presenting to the ED for evaluation of abdominal pain.  Pain began 3 days ago, is periumbilical and dull and constant, 7 out of 10 in severity, not worsened or alleviated with eating.  Not associated with nausea, vomiting, or bowel changes.  Her husband reports that the day of onset, he gave her pepto bismol with no relief.  Thinking she may be constipated, he gave her miralax yesterday, patient had a normal bowel movement yesterday that was non-watery, non-bloody.  Her husband has also noted a large bump in her abdomen above her belly button which he has noticed before but he just noticed today that it appears red.  She does localize her pain to this mass.  He reports that she has had similar episodes in the last several months, usually with normal work up and discharge home.     Review of Systems   Constitutional:  No fever, chills, body aches  ENT: No sore throat or runny nose.  Respiratory: No cough, shortness of breath, or wheezing  Cardiovascular: No chest pain or palpitations  Gastrointestinal: +abdominal pain. No nausea, vomiting, diarrhea, or constipation  Genitourinary: No dysuria or frequency.  Musculoskeletal: No swelling or joint pain  Integumentary: No rashes or lesions  Hematologic: No bleeding/bruising complaints  Neurological: No headache or dizziness.    Past Medical/Surgical History     Past Medical History:   Diagnosis Date   ??? Arthritis    ??? Chronic pain syndrome     related to R hip replacement    ??? COPD (chronic obstructive pulmonary disease) ([place])    ??? DM2 (diabetes mellitus, type 2) ([place])    ??? GERD (gastroesophageal reflux disease)    ??? Hepatitis C     HEP C   ??? HTN (hypertension)    ??? Lung nodule, multiple     (CT 10/2016) Small left upper lobe and 1.7 x 1.6 cm left lower lobe nodules not significantly changed from 07/23/2016 and 03/22/2016   ??? Marijuana use    ??? PTSD (post-traumatic stress disorder)     lived through the [place] in 1993   ??? Thoracic ascending aortic aneurysm ([place])     4.4cm noted on CT Chest (10/2016)   ??? Thyroid nodule     2.5 cm stable right thyroid nodule (CT 10/2016)     Past Surgical History:   Procedure Laterality Date   ??? HX ENDOSCOPY     ??? HX HIP REPLACEMENT Right 01/29/2015   ??? HX HIP REPLACEMENT     ??? HX HYSTERECTOMY  2010    hysterectomy       Social History     Social History     Socioeconomic History   ??? Marital status: MARRIED     Spouse name: Not on file   ??? Number of children: Not on file   ??? Years of education: Not on file   ???  Highest education level: Not on file   Tobacco Use   ??? Smoking status: Former Smoker   ??? Smokeless tobacco: Never Used   ??? Tobacco comment: reported as quit smoking around 1990s per spouse   Substance and Sexual Activity   ??? Alcohol use: No   ??? Drug use: Yes     Types: Marijuana   ??? Sexual activity: Never       Family History     Family History   Problem Relation Age of Onset   ??? Hypertension Mother    ??? Other Mother         OSA   ??? Cancer Father         suspected leukemia per pt's spouse   ??? Cancer Maternal Aunt    ??? Alcohol abuse Maternal Grandfather        Current Medications     Prior to Admission Medications   Prescriptions Last Dose Informant Patient Reported? Taking?   Blood Glucose Control High&Low (ACCU-CHEK AVIVA CONTROL SOLN) soln   No No   Sig: Check blood sugar daily   Blood-Glucose Meter (ACCU-CHEK AVIVA PLUS METER) misc   No No   Sig: Check blood sugar daily   Blood-Glucose Meter monitoring kit   No No    Sig: Check sugars 2 times per day, fasting and 2 hours after dinner   Insulin Needles, Disposable, (NANO PEN NEEDLE) 32 gauge x 5/32" ndle   No No   Sig: To use with Lantus pen   Lancets misc   No No   Sig: Check sugars twice daily   QUEtiapine SR (SEROQUEL XR) 150 mg sr tablet   No No   Sig: TAKE 1 TABLET BY MOUTH NIGHTLY   albuterol (ACCUNEB) 1.25 mg/3 mL nebu   No No   Sig: 3 mL by Nebulization route every six (6) hours as needed (SOB, wheezing). Indications: Asthma Attack   albuterol-ipratropium (DUO-NEB) 2.5 mg-0.5 mg/3 ml nebu   Yes No   Sig: 3 mL by Nebulization route.   alcohol swabs (BD SINGLE USE SWABS REGULAR) padm   No No   Sig: Check blood sugar daily   cloNIDine HCl (CATAPRES) 0.3 mg tablet   No No   Sig: Take bid   Patient taking differently: Take 1 Tab by mouth two (2) times a day. Take bid   dilTIAZem CD (CARDIZEM CD) 180 mg ER capsule   No No   Sig: Take 1 Cap by mouth daily.   fluticasone propion-salmeterol (ADVAIR DISKUS) 100-50 mcg/dose diskus inhaler   No No   Sig: Take 1 Puff by inhalation two (2) times a day.   gabapentin (NEURONTIN) 100 mg capsule   No No   Sig: TAKE 1 CAPSULE BY MOUTH THREE TIMES DAILY   glucose blood VI test strips (ACCU-CHEK AVIVA PLUS TEST STRP) strip   No No   Sig: Check blood sugar daily   glucose blood VI test strips (ASCENSIA AUTODISC VI, ONE TOUCH ULTRA TEST VI) strip   No No   Sig: Check sugars twice daily   insulin glargine (LANTUS SOLOSTAR U-100 INSULIN) 100 unit/mL (3 mL) inpn   No No   Sig: 32 Units by SubCUTAneous route nightly.   lancets (ACCU-CHEK SOFTCLIX LANCETS) misc   No No   Sig: Check blood sugar daily   losartan (COZAAR) 100 mg tablet   No No   Sig: TAKE 1 TABLET BY MOUTH DAILY FOR HYPERTENSION   magnesium oxide (MAG-OX) 400 mg  tablet   No No   Sig: TAKE 1 TABLET BY MOUTH DAILY   metFORMIN ER (GLUCOPHAGE XR) 500 mg tablet   No No   Sig: Take 1 Tab by mouth two (2) times a day.   montelukast (SINGULAIR) 10 mg tablet   No No    Sig: TAKE 1 TABLET BY MOUTH DAILY   nabumetone (RELAFEN) 750 mg tablet   Yes No   Sig: Take 750 mg by mouth two (2) times a day.   ondansetron (ZOFRAN ODT) 4 mg disintegrating tablet   No No   Sig: Take 1 Tab by mouth every eight (8) hours as needed for Nausea. Indications: nausea   polyethylene glycol (MIRALAX) 17 gram packet   No No   Sig: Take 1 Packet by mouth daily.   rosuvastatin (CRESTOR) 5 mg tablet   No No   Sig: TAKE 1 TABLET BY MOUTH NIGHTLY   spironolactone (ALDACTONE) 100 mg tablet   No No   Sig: TAKE 1 TABLET BY MOUTH DAILY   tiotropium bromide (SPIRIVA RESPIMAT) 2.5 mcg/actuation inhaler   No No   Sig: Take 1 Puff by inhalation daily.   traZODone (DESYREL) 50 mg tablet   No No   Sig: Take 1 Tab by mouth nightly as needed for Sleep.   triamcinolone (NASACORT) 55 mcg nasal inhaler   No No   Sig: 2 Sprays by Both Nostrils route daily.      Facility-Administered Medications: None       Allergies     Allergies   Allergen Reactions   ??? Chocolate [Cocoa] Sneezing       Physical Exam     ED Triage Vitals   ED Encounter Vitals Group      BP 01/29/18 1016 117/87      Pulse (Heart Rate) 01/29/18 1016 (!) 106      Resp Rate 01/29/18 1016 18      Temp 01/29/18 1016 98.5 ??F (36.9 ??C)      Temp src --       O2 Sat (%) 01/29/18 1016 95 %      Weight 01/29/18 1024 178 lb      Height 01/29/18 1024 5' 10"      Constitutional: Pleasant female, lying on her right side, appears uncomfortable but in no overt distress.   HEENT: Conjunctiva clear.  PERRL. Mucous membranes moist, non-erythematous. Surface of the pharynx, palate, and tongue are pink, moist and without lesions.  Neck: supple, non tender, symmetrical, no masses, JVD, or lymphadenopathy   Respiratory: lungs clear to auscultation, nonlabored respirations. No tachypnea or accessory muscle use.  Cardiovascular: heart regular rate and rhythm without murmur rubs or gallops.     Gastrointestinal:  Soft, tender with palpation over an obvious ventral  wall hernia above the umbilicus, I am unable to reduce the hernia, non-distended, normoactive bowel sounds  Musculoskeletal: Calves soft and non-tender. No peripheral edema or significant varicosities.  Pulses:  Radial and DP pulses 2+ and equal bilaterally.   Integumentary: warm and dry, no jaundice, no rashes or lesions  Neurologic: alert and oriented, no focal weakness.    Impression and Management Plan   67 year old female with a history of COPD, diabetes, hypertension presented to the ED for evaluation of abdominal pain with onset 3 days ago, constant, not associated with any nausea vomiting or bowel changes.  On exam, patient appears uncomfortable but is in no acute distress, borderline tachycardic 106, other vitals within normal limits, she is afebrile,  the abdomen is soft, she has tenderness with palpation over an obvious ventral wall hernia above the umbilicus which I am unable to reduce.  Will obtain CBC, CMP, lipase, urinalysis, lactate, CT abdomen and pelvis, will medicate symptomatically and reevaluate.    Diagnostic Studies   Lab:   Recent Results (from the past 12 hour(s))   CBC WITH AUTOMATED DIFF    Collection Time: 01/29/18 11:49 AM   Result Value Ref Range    WBC 12.4 (H) 4.0 - 11.0 1000/mm3    RBC 4.64 3.60 - 5.20 M/uL    HGB 13.4 13.0 - 17.2 gm/dl    HCT 40.9 37.0 - 50.0 %    MCV 88.1 80.0 - 98.0 fL    MCH 28.9 25.4 - 34.6 pg    MCHC 32.8 30.0 - 36.0 gm/dl    PLATELET 254 140 - 450 1000/mm3    MPV 10.9 (H) 6.0 - 10.0 fL    RDW-SD 44.8 36.4 - 46.3      NRBC 0 0 - 0      IMMATURE GRANULOCYTES 0.4 0.0 - 3.0 %    NEUTROPHILS 75.5 (H) 34 - 64 %    LYMPHOCYTES 15.1 (L) 28 - 48 %    MONOCYTES 8.3 1 - 13 %    EOSINOPHILS 0.4 0 - 5 %    BASOPHILS 0.3 0 - 3 %   METABOLIC PANEL, COMPREHENSIVE    Collection Time: 01/29/18 11:49 AM   Result Value Ref Range    Sodium 140 136 - 145 mEq/L    Potassium 4.2 3.5 - 5.1 mEq/L    Chloride 103 98 - 107 mEq/L    CO2 28 21 - 32 mEq/L    Glucose 106 74 - 106 mg/dl     BUN 17 7 - 25 mg/dl    Creatinine 1.0 0.6 - 1.3 mg/dl    GFR est AA >60.0      GFR est non-AA 59      Calcium 9.5 8.5 - 10.1 mg/dl    AST (SGOT) <3 (L) 15 - 37 U/L    ALT (SGPT) 12 12 - 78 U/L    Alk. phosphatase 78 45 - 117 U/L    Bilirubin, total 0.6 0.2 - 1.0 mg/dl    Protein, total 8.3 (H) 6.4 - 8.2 gm/dl    Albumin 3.6 3.4 - 5.0 gm/dl    Anion gap 8 5 - 15 mmol/L   LIPASE    Collection Time: 01/29/18 11:49 AM   Result Value Ref Range    Lipase 65 (L) 73 - 393 U/L   LACTIC ACID    Collection Time: 01/29/18 11:49 AM   Result Value Ref Range    Lactic Acid 0.9 0.4 - 2.0 mmol/L   POC URINE MACROSCOPIC    Collection Time: 01/29/18 12:10 PM   Result Value Ref Range    Glucose Negative NEGATIVE,Negative mg/dl    Bilirubin Negative NEGATIVE,Negative      Ketone Trace (A) NEGATIVE,Negative mg/dl    Specific gravity 1.020 1.005 - 1.030      Blood Trace-intact (A) NEGATIVE,Negative      pH (UA) 7.0 5 - 9      Protein 30 (A) NEGATIVE,Negative mg/dl    Urobilinogen 0.2 0.0 - 1.0 EU/dl    Nitrites Negative NEGATIVE,Negative      Leukocyte Esterase Small (A) NEGATIVE,Negative      Color Dark yellow      Appearance Clear     POC  URINE MICROSCOPIC    Collection Time: 01/29/18 12:10 PM   Result Value Ref Range    Epithelial cells, squamous 30-49 /LPF    WBC 15-29 /HPF    RBC OCCASIONAL /HPF    Bacteria 3+ /HPF   POC CHEM8    Collection Time: 01/29/18 12:50 PM   Result Value Ref Range    Sodium 140 136 - 145 mEq/L    Potassium 4.2 3.5 - 4.9 mEq/L    Chloride 101 98 - 107 mEq/L    CO2, TOTAL 27 21 - 32 mmol/L    Glucose 109 (H) 74 - 106 mg/dL    BUN 17 7 - 25 mg/dl    Creatinine 1.0 0.6 - 1.3 mg/dl    HCT 43 38 - 45 %    HGB 14.6 12.4 - 17.2 gm/dl    CALCIUM,IONIZED 4.60 4.40 - 5.40 mg/dL     Imaging:    Ct Abd Pelv W Cont    Result Date: 01/29/2018  DICOM format image data is available to non-affiliated external healthcare facilities or entities on a secure, media free, reciprocally searchable  basis with patient authorization for 12 months following the date of the study. Clinical history: Ventral hernia, abdominal pain EXAMINATION: CT scan of the abdomen and pelvis 01/29/2018. 5 mm spiral scanning is performed from the costophrenic angles to the symphysis pubis. Coronal and sagittal reconstruction imaging has been obtained. FINDINGS: There are bilateral bullae. Osteopenia and degenerative changes of the spine. Right hip prosthesis. There are splenic hypodensities in size from study of 08/27/2016. Spleen, adrenal glands and bladder are unremarkable. Uterus is absent. Subcentimeter renal hypodensities too small to characterize. 7 mm nonobstructing right nephrolithiasis. There are colonic diverticuli. Colon, terminal ileum, appendix, stomach and small bowel loops are within normal limits. Abdominal aorta is within normal limits. Small to borderline lymph nodes at the porta hepatis. Fat and fluid containing anterior abdominal wall hernia with surrounding inflammation suggesting incarceration.     IMPRESSION: 1. Incarcerated fat containing anterior abdominal wall hernia. 2. 7 mm nonobstructing right nephrolithiasis. 3. Diverticulosis. 4. Hysterectomy. 5. Small splenic lesions, increased in size from 08/27/2016.         ED Course/Medical Decision Making   Patient with a mild leukocytosis, white blood cell count is 12.4, remainder of CBC is unremarkable, hemoglobin is 13.4, hematocrit is 40.9.  Electrolytes grossly within normal limits, LFTs are normal as well, urinalysis with small leukocyte esterase but appears heavily contaminated with 30-49 epithelial cells, 15-29 white cells, 3+ bacteria, this has been sent for culture, patient is denying irritative voiding symptoms.  CT of the abdomen and pelvis reveals an incarcerated fat-containing anterior abdominal wall hernia consistent with the area of the patient's pain and consistent with her examination.     I called and spoke with Dr. Eulas Post on-call for general surgery regarding these findings, he reports that this is all likely chronic and does not require any urgent intervention, reports that he has office hours tomorrow and will see the patient tomorrow in his office for follow-up.  I discussed this with the patient, who reports "I do not understand why he cannot see me today", explained to her that there is no acute life-threatening pathology that would require emergent surgical intervention.  She was still having significant pain so she was medicated with a milligram of Dilaudid, after which went to check on the patient and she was feeling somewhat better; she never developed any nausea or vomiting while in our department.  I provided her with a prescription for Percocet, provided him with contact information for Dr. Eulas Post to contact him tomorrow to schedule follow-up.  Patient remained hemodynamically stable throughout her time in the emergency department and was discharged home in good condition.    Final Diagnosis       ICD-10-CM ICD-9-CM   1. Abdominal wall hernia K43.9 553.20       Disposition   Discharged home    [place], [place]  January 29, 2018    The patient was personally evaluated by myself and Dr. Emeterio Reeve who agrees with the above assessment and plan.    My signature above authenticates this document and my orders, the final diagnosis (es), discharge prescription (s), and instructions in the Epic record. If you have any questions please contact [phone].  ??  Nursing notes have been reviewed by the physician/ advanced practice clinician.

## 2018-01-29 NOTE — ED Provider Notes (Signed)
ED  Provider Notes by Hollie Salk, [place] at 01/29/18 1312                Author: Hollie Salk, PA  Service: Emergency Medicine  Author Type: Physician Assistant       Filed: 01/29/18 1637  Date of Service: 01/29/18 1312  Status: Attested           Editor: Hollie Salk, [place] (Physician Assistant)  Cosigner: Maylon Cos, MD at 01/29/18 1810          Attestation signed by Maylon Cos, MD at 01/29/18 1810          Continued by Dr. Hermina Barters dictation software was used for portions of this report. Unintended errors in transcription may occur.      I interviewed and examined the patient. I discussed with the mid-level provider agree with their evaluation and plan as documented here.      Patient's lungs are clear heart is regular   Abdomen is soft tender to palpation over what appears to be a ventral wall hernia above the umbilicus that does not feel reducible.   Labs and urine were obtained and the patient had a CT scan of the abdomen and pelvis read by the radiologist as fat and fluid containing anterior abdominal wall hernia with surrounding inflammation suggesting incarceration   A consult to general surgery by physician assistant who did not recommend any other urgent intervention and the patient will be discharged to follow-up with Dr. Eulas Post tomorrow returning sooner if worse                                 [facility]   Emergency Department Treatment Report          Patient: [name] [name]  Age: 67 y.o.  Sex: female          Date of Birth: 02-Nov-1950  Admit Date: 01/29/2018  PCP: Ernst Breach     MRN: [id number]   CSN: [id number]   ATTENDING: Loni Muse, MD         Room: FB51/WC58  Time Dictated: 1:12 PM  APP: Hollie Salk, PA-C        Chief Complaint      Chief Complaint       Patient presents with        ?  Abdominal Pain             History of Present Illness     67 y.o. female  with a history of COPD, DM2, HTN presenting to the ED for evaluation of abdominal  pain.  Pain began 3 days ago, is periumbilical and dull and constant, 7 out of 10 in severity, not worsened or alleviated with eating.  Not associated with nausea, vomiting,  or bowel changes.  Her husband reports that the day of onset, he gave her pepto bismol with no relief.  Thinking she may be constipated, he gave her miralax yesterday, patient had a normal bowel movement yesterday that was non-watery, non-bloody.  Her  husband has also noted a large bump in her abdomen above her belly button which he has noticed before but he just noticed today that it appears red.  She does localize her pain to this mass.  He reports that she has had similar episodes in the last several  months, usually with normal  work up and discharge home.         Review of Systems     Constitutional:  No fever, chills, body aches   ENT: No sore throat or runny nose.   Respiratory: No cough, shortness of breath, or wheezing   Cardiovascular: No chest pain or palpitations   Gastrointestinal: +abdominal pain.  No nausea, vomiting, diarrhea, or constipation   Genitourinary: No dysuria or frequency.   Musculoskeletal: No swelling or joint pain   Integumentary: No rashes or lesions   Hematologic: No bleeding/bruising complaints   Neurological: No headache or dizziness.        Past Medical/Surgical History          Past Medical History:        Diagnosis  Date         ?  Arthritis       ?  Chronic pain syndrome            related to R hip replacement         ?  COPD (chronic obstructive pulmonary disease) ([place])       ?  DM2 (diabetes mellitus, type 2) ([place])       ?  GERD (gastroesophageal reflux disease)       ?  Hepatitis C            HEP C         ?  HTN (hypertension)       ?  Lung nodule, multiple            (CT 10/2016) Small left upper lobe and 1.7 x 1.6 cm left lower lobe nodules not significantly changed from 07/23/2016 and 03/22/2016         ?  Marijuana use       ?  PTSD (post-traumatic stress disorder)            lived through the [place] in 1993         ?  Thoracic ascending aortic aneurysm ([place])            4.4cm noted on CT Chest (10/2016)         ?  Thyroid nodule            2.5 cm stable right thyroid nodule (CT 10/2016)          Past Surgical History:         Procedure  Laterality  Date          ?  HX ENDOSCOPY         ?  HX HIP REPLACEMENT  Right  01/29/2015     ?  HX HIP REPLACEMENT         ?  HX HYSTERECTOMY    2010          hysterectomy             Social History          Social History          Socioeconomic History         ?  Marital status:  MARRIED              Spouse name:  Not on file         ?  Number of children:  Not on file     ?  Years of education:  Not on file     ?  Highest education level:  Not  on file       Tobacco Use         ?  Smoking status:  Former Smoker     ?  Smokeless tobacco:  Never Used        ?  Tobacco comment: reported as quit smoking around 1990s per spouse       Substance and Sexual Activity         ?  Alcohol use:  No     ?  Drug use:  Yes              Types:  Marijuana         ?  Sexual activity:  Never             Family History          Family History         Problem  Relation  Age of Onset          ?  Hypertension  Mother       ?  Other  Mother                OSA          ?  Cancer  Father                suspected leukemia per pt's spouse          ?  Cancer  Maternal Aunt            ?  Alcohol abuse  Maternal Grandfather               Current Medications          Prior to Admission Medications     Prescriptions  Last Dose  Informant  Patient Reported?  Taking?      Blood Glucose Control High&Low (ACCU-CHEK AVIVA CONTROL SOLN) soln      No  No      Sig: Check blood sugar daily      Blood-Glucose Meter (ACCU-CHEK AVIVA PLUS METER) misc      No  No      Sig: Check blood sugar daily      Blood-Glucose Meter monitoring kit      No  No      Sig: Check sugars 2 times per day, fasting and 2 hours after dinner      Insulin Needles, Disposable, (NANO PEN NEEDLE) 32 gauge x 5/32" ndle      No  No       Sig: To use with Lantus pen      Lancets misc      No  No      Sig: Check sugars twice daily      QUEtiapine SR (SEROQUEL XR) 150 mg sr tablet      No  No      Sig: TAKE 1 TABLET BY MOUTH NIGHTLY      albuterol (ACCUNEB) 1.25 mg/3 mL nebu      No  No      Sig: 3 mL by Nebulization route every six (6) hours as needed (SOB, wheezing). Indications: Asthma Attack      albuterol-ipratropium (DUO-NEB) 2.5 mg-0.5 mg/3 ml nebu      Yes  No      Sig: 3 mL by Nebulization route.      alcohol swabs (BD SINGLE USE SWABS REGULAR) padm      No  No  Sig: Check blood sugar daily      cloNIDine HCl (CATAPRES) 0.3 mg tablet      No  No      Sig: Take bid      Patient taking differently: Take 1 Tab by mouth two (2) times a day. Take bid      dilTIAZem CD (CARDIZEM CD) 180 mg ER capsule      No  No      Sig: Take 1 Cap by mouth daily.      fluticasone propion-salmeterol (ADVAIR DISKUS) 100-50 mcg/dose diskus inhaler      No  No      Sig: Take 1 Puff by inhalation two (2) times a day.      gabapentin (NEURONTIN) 100 mg capsule      No  No      Sig: TAKE 1 CAPSULE BY MOUTH THREE TIMES DAILY      glucose blood VI test strips (ACCU-CHEK AVIVA PLUS TEST STRP) strip      No  No      Sig: Check blood sugar daily      glucose blood VI test strips (ASCENSIA AUTODISC VI, ONE TOUCH ULTRA TEST VI) strip      No  No      Sig: Check sugars twice daily      insulin glargine (LANTUS SOLOSTAR U-100 INSULIN) 100 unit/mL (3 mL) inpn      No  No      Sig: 32 Units by SubCUTAneous route nightly.      lancets (ACCU-CHEK SOFTCLIX LANCETS) misc      No  No      Sig: Check blood sugar daily      losartan (COZAAR) 100 mg tablet      No  No      Sig: TAKE 1 TABLET BY MOUTH DAILY FOR HYPERTENSION      magnesium oxide (MAG-OX) 400 mg tablet      No  No      Sig: TAKE 1 TABLET BY MOUTH DAILY      metFORMIN ER (GLUCOPHAGE XR) 500 mg tablet      No  No      Sig: Take 1 Tab by mouth two (2) times a day.      montelukast (SINGULAIR) 10 mg tablet      No  No       Sig: TAKE 1 TABLET BY MOUTH DAILY      nabumetone (RELAFEN) 750 mg tablet      Yes  No      Sig: Take 750 mg by mouth two (2) times a day.      ondansetron (ZOFRAN ODT) 4 mg disintegrating tablet      No  No      Sig: Take 1 Tab by mouth every eight (8) hours as needed for Nausea. Indications: nausea      polyethylene glycol (MIRALAX) 17 gram packet      No  No      Sig: Take 1 Packet by mouth daily.      rosuvastatin (CRESTOR) 5 mg tablet      No  No      Sig: TAKE 1 TABLET BY MOUTH NIGHTLY      spironolactone (ALDACTONE) 100 mg tablet      No  No      Sig: TAKE 1 TABLET BY MOUTH DAILY      tiotropium bromide (SPIRIVA RESPIMAT) 2.5 mcg/actuation inhaler  No  No      Sig: Take 1 Puff by inhalation daily.      traZODone (DESYREL) 50 mg tablet      No  No      Sig: Take 1 Tab by mouth nightly as needed for Sleep.      triamcinolone (NASACORT) 55 mcg nasal inhaler      No  No      Sig: 2 Sprays by Both Nostrils route daily.               Facility-Administered Medications: None             Allergies          Allergies        Allergen  Reactions         ?  Chocolate [Cocoa]  Sneezing             Physical Exam          ED Triage Vitals     ED Encounter Vitals Group            BP  01/29/18 1016  117/87        Pulse (Heart Rate)  01/29/18 1016  (!) 106        Resp Rate  01/29/18 1016  18        Temp  01/29/18 1016  98.5 ??F (36.9 ??C)        Temp src  --          O2 Sat (%)  01/29/18 1016  95 %        Weight  01/29/18 1024  178 lb            Height  01/29/18 1024  _0         Constitutional: Pleasant female, lying on her right side, appears uncomfortable but in no overt distress.    HEENT: Conjunctiva clear.  PERRL. Mucous membranes moist, non-erythematous. Surface of the pharynx, palate, and tongue are pink, moist and  without lesions.   Neck: supple, non tender, symmetrical, no masses, JVD, or lymphadenopathy    Respiratory: lungs clear to auscultation, nonlabored respirations. No tachypnea or accessory muscle  use.   Cardiovascular: heart regular rate and rhythm without murmur rubs or gallops.      Gastrointestinal:  Soft, tender with palpation over an obvious ventral wall hernia above the umbilicus, I am unable to reduce the hernia, non-distended,  normoactive bowel sounds   Musculoskeletal: Calves soft and non-tender. No peripheral edema or significant varicosities.   Pulses:  Radial and DP pulses 2+ and equal bilaterally.    Integumentary: warm and dry, no jaundice, no rashes or lesions   Neurologic: alert and oriented, no focal weakness.        Impression and Management Plan     67 year old female with a history of COPD, diabetes, hypertension presented to the ED for evaluation of abdominal pain with onset 3 days ago, constant, not associated  with any nausea vomiting or bowel changes.  On exam, patient appears uncomfortable but is in no acute distress, borderline tachycardic 106, other vitals within normal limits, she is afebrile, the abdomen is soft, she has tenderness with palpation over  an obvious ventral wall hernia above the umbilicus which I am unable to reduce.  Will obtain CBC, CMP, lipase, urinalysis, lactate, CT abdomen and pelvis, will medicate symptomatically and reevaluate.        Diagnostic Studies     Lab:  Recent Results (from the past 12 hour(s))     CBC WITH AUTOMATED DIFF          Collection Time: 01/29/18 11:49 AM         Result  Value  Ref Range            WBC  12.4 (H)  4.0 - 11.0 1000/mm3       RBC  4.64  3.60 - 5.20 M/uL       HGB  13.4  13.0 - 17.2 gm/dl       HCT  40.9  37.0 - 50.0 %       MCV  88.1  80.0 - 98.0 fL       MCH  28.9  25.4 - 34.6 pg       MCHC  32.8  30.0 - 36.0 gm/dl       PLATELET  254  140 - 450 1000/mm3       MPV  10.9 (H)  6.0 - 10.0 fL       RDW-SD  44.8  36.4 - 46.3         NRBC  0  0 - 0         IMMATURE GRANULOCYTES  0.4  0.0 - 3.0 %       NEUTROPHILS  75.5 (H)  34 - 64 %       LYMPHOCYTES  15.1 (L)  28 - 48 %       MONOCYTES  8.3  1 - 13 %       EOSINOPHILS  0.4   0 - 5 %       BASOPHILS  0.3  0 - 3 %       METABOLIC PANEL, COMPREHENSIVE          Collection Time: 01/29/18 11:49 AM         Result  Value  Ref Range            Sodium  140  136 - 145 mEq/L       Potassium  4.2  3.5 - 5.1 mEq/L            Chloride  103  98 - 107 mEq/L            CO2  28  21 - 32 mEq/L       Glucose  106  74 - 106 mg/dl       BUN  17  7 - 25 mg/dl       Creatinine  1.0  0.6 - 1.3 mg/dl       GFR est AA  >60.0          GFR est non-AA  59          Calcium  9.5  8.5 - 10.1 mg/dl       AST (SGOT)  <3 (L)  15 - 37 U/L       ALT (SGPT)  12  12 - 78 U/L       Alk. phosphatase  78  45 - 117 U/L       Bilirubin, total  0.6  0.2 - 1.0 mg/dl       Protein, total  8.3 (H)  6.4 - 8.2 gm/dl       Albumin  3.6  3.4 - 5.0 gm/dl       Anion gap  8  5 - 15 mmol/L       LIPASE  Collection Time: 01/29/18 11:49 AM         Result  Value  Ref Range            Lipase  65 (L)  73 - 393 U/L       LACTIC ACID          Collection Time: 01/29/18 11:49 AM         Result  Value  Ref Range            Lactic Acid  0.9  0.4 - 2.0 mmol/L       POC URINE MACROSCOPIC          Collection Time: 01/29/18 12:10 PM         Result  Value  Ref Range            Glucose  Negative  NEGATIVE,Negative mg/dl       Bilirubin  Negative  NEGATIVE,Negative         Ketone  Trace (A)  NEGATIVE,Negative mg/dl       Specific gravity  1.020  1.005 - 1.030         Blood  Trace-intact (A)  NEGATIVE,Negative         pH (UA)  7.0  5 - 9         Protein  30 (A)  NEGATIVE,Negative mg/dl       Urobilinogen  0.2  0.0 - 1.0 EU/dl       Nitrites  Negative  NEGATIVE,Negative         Leukocyte Esterase  Small (A)  NEGATIVE,Negative         Color  Dark yellow          Appearance  Clear          POC URINE MICROSCOPIC          Collection Time: 01/29/18 12:10 PM         Result  Value  Ref Range            Epithelial cells, squamous  30-49  /LPF       WBC  15-29  /HPF       RBC  OCCASIONAL  /HPF       Bacteria  3+  /HPF       POC CHEM8          Collection Time:  01/29/18 12:50 PM         Result  Value  Ref Range            Sodium  140  136 - 145 mEq/L       Potassium  4.2  3.5 - 4.9 mEq/L       Chloride  101  98 - 107 mEq/L       CO2, TOTAL  27  21 - 32 mmol/L       Glucose  109 (H)  74 - 106 mg/dL       BUN  17  7 - 25 mg/dl       Creatinine  1.0  0.6 - 1.3 mg/dl       HCT  43  38 - 45 %       HGB  14.6  12.4 - 17.2 gm/dl            CALCIUM,IONIZED  4.60  4.40 - 5.40 mg/dL        Imaging:     Ct Abd Pelv W Cont  Result Date: 01/29/2018   DICOM format image data is available to non-affiliated external healthcare facilities or entities on a secure, media free, reciprocally searchable basis with patient authorization for 12 months following the date of the study. Clinical history: Ventral  hernia, abdominal pain EXAMINATION: CT scan of the abdomen and pelvis 01/29/2018. 5 mm spiral scanning is performed from the costophrenic angles to the symphysis pubis. Coronal and sagittal reconstruction imaging has been obtained. FINDINGS: There are  bilateral bullae. Osteopenia and degenerative changes of the spine. Right hip prosthesis. There are splenic hypodensities in size from study of 08/27/2016. Spleen, adrenal glands and bladder are unremarkable. Uterus is absent. Subcentimeter renal hypodensities  too small to characterize. 7 mm nonobstructing right nephrolithiasis. There are colonic diverticuli. Colon, terminal ileum, appendix, stomach and small bowel loops are within normal limits. Abdominal aorta is within normal limits. Small to borderline  lymph nodes at the porta hepatis. Fat and fluid containing anterior abdominal wall hernia with surrounding inflammation suggesting incarceration.       IMPRESSION: 1. Incarcerated fat containing anterior abdominal wall hernia. 2. 7 mm nonobstructing right nephrolithiasis. 3. Diverticulosis. 4. Hysterectomy. 5. Small splenic lesions, increased in size from 08/27/2016.               ED Course/Medical Decision Making     Patient with a mild  leukocytosis, white blood cell count is 12.4, remainder of CBC is unremarkable, hemoglobin is 13.4, hematocrit is 40.9.  Electrolytes grossly  within normal limits, LFTs are normal as well, urinalysis with small leukocyte esterase but appears heavily contaminated with 30-49 epithelial cells, 15-29 white cells, 3+ bacteria, this has been sent for culture, patient is denying irritative voiding  symptoms.  CT of the abdomen and pelvis reveals an incarcerated fat-containing anterior abdominal wall hernia consistent with the area of the patient's pain and consistent with her examination.      I called and spoke with Dr. Eulas Post on-call for general surgery regarding these findings, he reports that this is all likely chronic and does not require any urgent intervention, reports that he has office hours tomorrow and will see the patient tomorrow  in his office for follow-up.  I discussed this with the patient, who reports "I do not understand why he cannot see me today", explained to her that there is no acute life-threatening pathology that would require emergent surgical intervention.  She was  still having significant pain so she was medicated with a milligram of Dilaudid, after which went to check on the patient and she was feeling somewhat better; she never developed any nausea or vomiting while in our department.  I provided her with a prescription  for Percocet, provided him with contact information for Dr. Eulas Post to contact him tomorrow to schedule follow-up.  Patient remained hemodynamically stable throughout her time in the emergency department and was discharged home in good condition.        Final Diagnosis                 ICD-10-CM  ICD-9-CM          1.  Abdominal wall hernia  K43.9  553.20             Disposition     Discharged home      [place], [place]   January 29, 2018      The patient was personally evaluated by myself and Dr. Emeterio Reeve who agrees with the above assessment and plan.  My signature above  authenticates this document and my orders, the final diagnosis (es), discharge prescription (s), and instructions in the Epic record. If you have any questions please contact [phone].   ??   Nursing notes have been reviewed by the physician/ advanced practice clinician.

## 2018-01-29 NOTE — ED Notes (Signed)
Started yesterday with middle area abd pain

## 2018-01-29 NOTE — ED Notes (Signed)
4:57 PM  01/29/18     Discharge instructions given to patient (name) with verbalization of understanding. Patient accompanied by father.  Patient discharged with the following prescriptions percocet. Patient discharged to home (destination).      Annamary CarolinLyn David, RN

## 2018-01-30 ENCOUNTER — Encounter

## 2018-01-30 NOTE — Telephone Encounter (Signed)
Pt was advised to go back to see Dr. 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 They would prefer someone in [place], she hasn't see him in a while. [facility]?

## 2018-02-01 LAB — CULTURE, URINE
CULTURE RESULT: 20000
Culture result: 20000
Isolate: >100000 — AB

## 2018-02-01 MED ORDER — NITROFURANTOIN (25% MACROCRYSTAL FORM) 100 MG CAP
100 mg | ORAL_CAPSULE | Freq: Two times a day (BID) | ORAL | 0 refills | Status: DC
Start: 2018-02-01 — End: 2018-02-08

## 2018-02-04 ENCOUNTER — Inpatient Hospital Stay
Admit: 2018-02-04 | Discharge: 2018-02-08 | Disposition: A | Discharge status: Home / Self Care | Payer: MEDICARE | Attending: Internal Medicine | Admitting: Internal Medicine

## 2018-02-04 ENCOUNTER — Emergency Department: Admit: 2018-02-04 | Payer: MEDICARE | Primary: Physician Assistant

## 2018-02-04 DIAGNOSIS — K436 Other and unspecified ventral hernia with obstruction, without gangrene: Principal | ICD-10-CM

## 2018-02-04 LAB — CBC WITH AUTO DIFFERENTIAL
Basophils %: 0.3 % (ref 0–3)
Eosinophils %: 0.7 % (ref 0–5)
Hematocrit: 40.2 % (ref 37.0–50.0)
Hemoglobin: 13.4 gm/dl (ref 13.0–17.2)
Immature Granulocytes: 0.3 % (ref 0.0–3.0)
Lymphocytes %: 27.5 % — ABNORMAL LOW (ref 28–48)
MCH: 28.6 pg (ref 25.4–34.6)
MCHC: 33.3 gm/dl (ref 30.0–36.0)
MCV: 85.9 fL (ref 80.0–98.0)
MPV: 10.5 fL — ABNORMAL HIGH (ref 6.0–10.0)
Monocytes %: 6.7 % (ref 1–13)
Neutrophils %: 64.5 % — ABNORMAL HIGH (ref 34–64)
Nucleated RBCs: 0 (ref 0–0)
Platelets: 329 10*3/uL (ref 140–450)
RBC: 4.68 M/uL (ref 3.60–5.20)
RDW-SD: 42.2 (ref 36.4–46.3)
WBC: 11.5 10*3/uL — ABNORMAL HIGH (ref 4.0–11.0)

## 2018-02-04 LAB — TROPONIN: Troponin I: <0.015 ng/ml (ref 0.000–0.045)

## 2018-02-04 LAB — BASIC METABOLIC PANEL
Anion Gap: 11 mmol/L (ref 5–15)
BUN: 19 mg/dl (ref 7–25)
CO2: 26 mEq/L (ref 21–32)
Calcium: 9.9 mg/dl (ref 8.5–10.1)
Chloride: 98 mEq/L (ref 98–107)
Creatinine: 1.2 mg/dl (ref 0.6–1.3)
EGFR IF NonAfrican American: 48
GFR African American: 58
Glucose: 125 mg/dl — ABNORMAL HIGH (ref 74–106)
Potassium: 4 mEq/L (ref 3.5–5.1)
Sodium: 135 mEq/L — ABNORMAL LOW (ref 136–145)

## 2018-02-04 LAB — POCT GLUCOSE
POC Glucose: 116 mg/dL — ABNORMAL HIGH (ref 65–105)
POC Glucose: 190 mg/dL — ABNORMAL HIGH (ref 65–105)

## 2018-02-04 LAB — PROBNP, N-TERMINAL: BNP: 43 pg/ml (ref 0.0–125.0)

## 2018-02-04 LAB — METABOLIC PANEL, BASIC
Anion gap: 11 mmol/L (ref 5–15)
BUN: 19 mg/dl (ref 7–25)
CO2: 26 mEq/L (ref 21–32)
Calcium: 9.9 mg/dl (ref 8.5–10.1)
Chloride: 98 mEq/L (ref 98–107)
Creatinine: 1.2 mg/dl (ref 0.6–1.3)
GFR est AA: 58
GFR est non-AA: 48
Glucose: 125 mg/dl — ABNORMAL HIGH (ref 74–106)
Potassium: 4 mEq/L (ref 3.5–5.1)
Sodium: 135 mEq/L — ABNORMAL LOW (ref 136–145)

## 2018-02-04 LAB — CBC WITH AUTOMATED DIFF
BASOPHILS: 0.3 % (ref 0–3)
EOSINOPHILS: 0.7 % (ref 0–5)
HCT: 40.2 % (ref 37.0–50.0)
HGB: 13.4 gm/dl (ref 13.0–17.2)
IMMATURE GRANULOCYTES: 0.3 % (ref 0.0–3.0)
LYMPHOCYTES: 27.5 % — ABNORMAL LOW (ref 28–48)
MCH: 28.6 pg (ref 25.4–34.6)
MCHC: 33.3 gm/dl (ref 30.0–36.0)
MCV: 85.9 fL (ref 80.0–98.0)
MONOCYTES: 6.7 % (ref 1–13)
MPV: 10.5 fL — ABNORMAL HIGH (ref 6.0–10.0)
NEUTROPHILS: 64.5 % — ABNORMAL HIGH (ref 34–64)
NRBC: 0 (ref 0–0)
PLATELET: 329 10*3/uL (ref 140–450)
RBC: 4.68 M/uL (ref 3.60–5.20)
RDW-SD: 42.2 (ref 36.4–46.3)
WBC: 11.5 10*3/uL — ABNORMAL HIGH (ref 4.0–11.0)

## 2018-02-04 LAB — GLUCOSE, POC
Glucose (POC): 116 mg/dL — ABNORMAL HIGH (ref 65–105)
Glucose (POC): 190 mg/dL — ABNORMAL HIGH (ref 65–105)

## 2018-02-04 LAB — TROPONIN I: Troponin-I: <0.015 ng/ml (ref 0.000–0.045)

## 2018-02-04 LAB — NT-PRO BNP: NT pro-BNP: 43 pg/ml (ref 0.0–125.0)

## 2018-02-04 MED ORDER — MORPHINE 4 MG/ML SYRINGE
4 mg/mL | INTRAMUSCULAR | Status: AC
Start: 2018-02-04 — End: 2018-02-04
  Administered 2018-02-04: 13:00:00 via INTRAVENOUS

## 2018-02-04 MED ORDER — ONDANSETRON (PF) 4 MG/2 ML INJECTION
4 mg/2 mL | INTRAMUSCULAR | Status: DC | PRN
Start: 2018-02-04 — End: 2018-02-04

## 2018-02-04 MED ORDER — HEPARIN (PORCINE) 5,000 UNIT/ML IJ SOLN
5000 unit/mL | Freq: Three times a day (TID) | INTRAMUSCULAR | Status: DC
Start: 2018-02-04 — End: 2018-02-08
  Administered 2018-02-04 – 2018-02-08 (×11): via SUBCUTANEOUS

## 2018-02-04 MED ORDER — NALOXONE 0.4 MG/ML INJECTION
0.4 mg/mL | INTRAMUSCULAR | Status: DC | PRN
Start: 2018-02-04 — End: 2018-02-08

## 2018-02-04 MED ORDER — METHYLPREDNISOLONE (PF) 125 MG/2 ML IJ SOLR
125 mg/2 mL | Freq: Once | INTRAMUSCULAR | Status: AC
Start: 2018-02-04 — End: 2018-02-04
  Administered 2018-02-04: 13:00:00 via INTRAVENOUS

## 2018-02-04 MED ORDER — INSULIN LISPRO 100 UNIT/ML INJECTION
100 unit/mL | Freq: Four times a day (QID) | SUBCUTANEOUS | Status: DC
Start: 2018-02-04 — End: 2018-02-08
  Administered 2018-02-04 – 2018-02-08 (×15): via SUBCUTANEOUS

## 2018-02-04 MED ORDER — OXYCODONE-ACETAMINOPHEN 10 MG-325 MG TAB
10-325 mg | ORAL | Status: DC | PRN
Start: 2018-02-04 — End: 2018-02-08
  Administered 2018-02-04 – 2018-02-08 (×16): via ORAL

## 2018-02-04 MED ORDER — PREDNISONE 20 MG TAB
20 mg | Freq: Every day | ORAL | Status: DC
Start: 2018-02-04 — End: 2018-02-06
  Administered 2018-02-04 – 2018-02-06 (×3): via ORAL

## 2018-02-04 MED ORDER — QUETIAPINE SR 50 MG 24 HR TAB
50 mg | Freq: Every evening | ORAL | Status: DC
Start: 2018-02-04 — End: 2018-02-08
  Administered 2018-02-05 – 2018-02-08 (×5): via ORAL

## 2018-02-04 MED ORDER — TRAZODONE 50 MG TAB
50 mg | Freq: Every evening | ORAL | Status: DC | PRN
Start: 2018-02-04 — End: 2018-02-08
  Administered 2018-02-05 – 2018-02-06 (×2): via ORAL

## 2018-02-04 MED ORDER — DIPHENHYDRAMINE HCL 50 MG/ML IJ SOLN
50 mg/mL | INTRAMUSCULAR | Status: DC | PRN
Start: 2018-02-04 — End: 2018-02-08
  Administered 2018-02-05 – 2018-02-06 (×2): via INTRAVENOUS

## 2018-02-04 MED ORDER — ACETAMINOPHEN 650 MG RECTAL SUPPOSITORY
650 mg | RECTAL | Status: DC | PRN
Start: 2018-02-04 — End: 2018-02-08

## 2018-02-04 MED ORDER — DEXTROSE 50% IN WATER (D50W) IV
INTRAVENOUS | Status: DC | PRN
Start: 2018-02-04 — End: 2018-02-08

## 2018-02-04 MED ORDER — IPRATROPIUM-ALBUTEROL 2.5 MG-0.5 MG/3 ML NEB SOLUTION
2.5 mg-0.5 mg/3 ml | Freq: Four times a day (QID) | RESPIRATORY_TRACT | Status: DC
Start: 2018-02-04 — End: 2018-02-05
  Administered 2018-02-04 – 2018-02-05 (×5): via RESPIRATORY_TRACT

## 2018-02-04 MED ORDER — ACETAMINOPHEN 325 MG TABLET
325 mg | ORAL | Status: DC | PRN
Start: 2018-02-04 — End: 2018-02-08

## 2018-02-04 MED ORDER — ALBUTEROL SULFATE 0.083 % (0.83 MG/ML) SOLN FOR INHALATION
2.5 mg /3 mL (0.083 %) | RESPIRATORY_TRACT | Status: DC | PRN
Start: 2018-02-04 — End: 2018-02-08

## 2018-02-04 MED ORDER — FLUTICASONE 100 MCG-VILANTEROL 25 MCG/DOSE BREATH ACTIVATED INHALER
100-25 mcg/dose | Freq: Every day | RESPIRATORY_TRACT | Status: DC
Start: 2018-02-04 — End: 2018-02-08
  Administered 2018-02-05 – 2018-02-08 (×4): via RESPIRATORY_TRACT

## 2018-02-04 MED ORDER — ALBUTEROL SULFATE 0.083 % (0.83 MG/ML) SOLN FOR INHALATION
2.5 mg /3 mL (0.083 %) | RESPIRATORY_TRACT | Status: DC | PRN
Start: 2018-02-04 — End: 2018-02-04

## 2018-02-04 MED ORDER — IPRATROPIUM-ALBUTEROL 2.5 MG-0.5 MG/3 ML NEB SOLUTION
2.5 mg-0.5 mg/3 ml | RESPIRATORY_TRACT | Status: AC
Start: 2018-02-04 — End: 2018-02-04
  Administered 2018-02-04: 12:00:00 via RESPIRATORY_TRACT

## 2018-02-04 MED ORDER — GLUCAGON 1 MG INJECTION
1 mg | INTRAMUSCULAR | Status: DC | PRN
Start: 2018-02-04 — End: 2018-02-08

## 2018-02-04 MED ORDER — ALBUTEROL SULFATE 0.083 % (0.83 MG/ML) SOLN FOR INHALATION
2.5 mg /3 mL (0.083 %) | RESPIRATORY_TRACT | Status: DC
Start: 2018-02-04 — End: 2018-02-04
  Administered 2018-02-04: 16:00:00 via RESPIRATORY_TRACT

## 2018-02-04 MED ORDER — MONTELUKAST 10 MG TAB
10 mg | Freq: Every evening | ORAL | Status: DC
Start: 2018-02-04 — End: 2018-02-08
  Administered 2018-02-05 – 2018-02-08 (×5): via ORAL

## 2018-02-04 MED ORDER — GABAPENTIN 100 MG CAP
100 mg | Freq: Three times a day (TID) | ORAL | Status: DC
Start: 2018-02-04 — End: 2018-02-08
  Administered 2018-02-04 – 2018-02-08 (×13): via ORAL

## 2018-02-04 MED ORDER — MORPHINE 2 MG/ML INJECTION
2 mg/mL | INTRAMUSCULAR | Status: DC | PRN
Start: 2018-02-04 — End: 2018-02-08
  Administered 2018-02-04 – 2018-02-08 (×22): via INTRAVENOUS

## 2018-02-04 MED ORDER — ONDANSETRON (PF) 4 MG/2 ML INJECTION
4 mg/2 mL | INTRAMUSCULAR | Status: DC | PRN
Start: 2018-02-04 — End: 2018-02-08

## 2018-02-04 MED ORDER — SODIUM CHLORIDE 0.9 % IJ SYRG
Freq: Once | INTRAMUSCULAR | Status: AC
Start: 2018-02-04 — End: 2018-02-04
  Administered 2018-02-04: 13:00:00 via INTRAVENOUS

## 2018-02-04 MED ORDER — ACETAMINOPHEN (TYLENOL) SOLUTION 32MG/ML
ORAL | Status: DC | PRN
Start: 2018-02-04 — End: 2018-02-08

## 2018-02-04 MED ORDER — INSULIN LISPRO 100 UNIT/ML INJECTION
100 unit/mL | SUBCUTANEOUS | Status: DC | PRN
Start: 2018-02-04 — End: 2018-02-08
  Administered 2018-02-04 – 2018-02-07 (×5): via SUBCUTANEOUS

## 2018-02-04 MED ORDER — LOSARTAN 50 MG TAB
50 mg | Freq: Every day | ORAL | Status: DC
Start: 2018-02-04 — End: 2018-02-08
  Administered 2018-02-05 – 2018-02-08 (×4): via ORAL

## 2018-02-04 MED ORDER — ROSUVASTATIN 5 MG TAB
5 mg | Freq: Every evening | ORAL | Status: DC
Start: 2018-02-04 — End: 2018-02-08
  Administered 2018-02-05 – 2018-02-08 (×5): via ORAL

## 2018-02-04 MED ORDER — INSULIN GLARGINE 100 UNIT/ML INJECTION
100 unit/mL | Freq: Every evening | SUBCUTANEOUS | Status: DC
Start: 2018-02-04 — End: 2018-02-08
  Administered 2018-02-05 – 2018-02-08 (×5): via SUBCUTANEOUS

## 2018-02-04 MED ORDER — CLONIDINE 0.1 MG TAB
0.1 mg | Freq: Two times a day (BID) | ORAL | Status: DC
Start: 2018-02-04 — End: 2018-02-08
  Administered 2018-02-04 – 2018-02-08 (×9): via ORAL

## 2018-02-04 MED ORDER — SPIRONOLACTONE 100 MG TAB
100 mg | Freq: Every day | ORAL | Status: DC
Start: 2018-02-04 — End: 2018-02-08
  Administered 2018-02-05 – 2018-02-08 (×4): via ORAL

## 2018-02-04 MED ORDER — DILTIAZEM ER 180 MG 24 HR CAP
180 mg | Freq: Every day | ORAL | Status: DC
Start: 2018-02-04 — End: 2018-02-08
  Administered 2018-02-05 – 2018-02-08 (×4): via ORAL

## 2018-02-04 MED ORDER — ALBUTEROL SULFATE 0.083 % (0.83 MG/ML) SOLN FOR INHALATION
2.5 mg /3 mL (0.083 %) | RESPIRATORY_TRACT | Status: AC
Start: 2018-02-04 — End: 2018-02-04
  Administered 2018-02-04 (×2): via RESPIRATORY_TRACT

## 2018-02-04 MED FILL — CLONIDINE 0.1 MG TAB: 0.1 mg | ORAL | Qty: 3

## 2018-02-04 MED FILL — MORPHINE 2 MG/ML INJECTION: 2 mg/mL | INTRAMUSCULAR | Qty: 1

## 2018-02-04 MED FILL — MORPHINE 4 MG/ML SYRINGE: 4 mg/mL | INTRAMUSCULAR | Qty: 1

## 2018-02-04 MED FILL — OXYCODONE-ACETAMINOPHEN 10 MG-325 MG TAB: 10-325 mg | ORAL | Qty: 1

## 2018-02-04 MED FILL — GABAPENTIN 100 MG CAP: 100 mg | ORAL | Qty: 1

## 2018-02-04 MED FILL — IPRATROPIUM-ALBUTEROL 2.5 MG-0.5 MG/3 ML NEB SOLUTION: 2.5 mg-0.5 mg/3 ml | RESPIRATORY_TRACT | Qty: 3

## 2018-02-04 MED FILL — BREO ELLIPTA 100 MCG-25 MCG/DOSE POWDER FOR INHALATION: 100-25 mcg/dose | RESPIRATORY_TRACT | Qty: 28

## 2018-02-04 MED FILL — ALBUTEROL SULFATE 0.083 % (0.83 MG/ML) SOLN FOR INHALATION: 2.5 mg /3 mL (0.083 %) | RESPIRATORY_TRACT | Qty: 1

## 2018-02-04 MED FILL — PREDNISONE 20 MG TAB: 20 mg | ORAL | Qty: 2

## 2018-02-04 MED FILL — HEPARIN (PORCINE) 5,000 UNIT/ML IJ SOLN: 5000 unit/mL | INTRAMUSCULAR | Qty: 1

## 2018-02-04 MED FILL — SOLU-MEDROL (PF) 125 MG/2 ML SOLUTION FOR INJECTION: 125 mg/2 mL | INTRAMUSCULAR | Qty: 2
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
## 2018-02-04 NOTE — Other (Signed)
Bedside and Verbal shift change report given to Mary RN (oncoming nurse) by Gina RN (offgoing nurse). Report included the following information SBAR, Kardex, MAR and Med Rec Status.

## 2018-02-04 NOTE — Progress Notes (Signed)
Pt admitted with abd pain and asthma exacerbations. Pt taking morphine and percocet for pain with partial relief. Pt dyspneic earlier but breathing has stabalized. Nebs per Rt. Pt with purewick. Husband at bedside.        Problem: Diabetes Self-Management  Goal: *Disease process and treatment process  Description  Define diabetes and identify own type of diabetes; list 3 options for treating diabetes.  Outcome: Progressing Towards Goal  Goal: *Incorporating nutritional management into lifestyle  Description  Describe effect of type, amount and timing of food on blood glucose; list 3 methods for planning meals.  Outcome: Progressing Towards Goal  Goal: *Incorporating physical activity into lifestyle  Description  State effect of exercise on blood glucose levels.  Outcome: Progressing Towards Goal  Goal: *Developing strategies to promote health/change behavior  Description  Define the ABC's of diabetes; identify appropriate screenings, schedule and personal plan for screenings.  Outcome: Progressing Towards Goal  Goal: *Using medications safely  Description  State effect of diabetes medications on diabetes; name diabetes medication taking, action and side effects.  Outcome: Progressing Towards Goal  Goal: *Monitoring blood glucose, interpreting and using results  Description  Identify recommended blood glucose targets  and personal targets.  Outcome: Progressing Towards Goal  Goal: *Prevention, detection, treatment of acute complications  Description  List symptoms of hyper- and hypoglycemia; describe how to treat low blood sugar and actions for lowering  high blood glucose level.  Outcome: Progressing Towards Goal  Goal: *Prevention, detection and treatment of chronic complications  Description  Define the natural course of diabetes and describe the relationship of blood glucose levels to long term complications of diabetes.  Outcome: Progressing Towards Goal   Goal: *Developing strategies to address psychosocial issues  Description  Describe feelings about living with diabetes; identify support needed and support network  Outcome: Progressing Towards Goal  Goal: *Insulin pump training  Outcome: Progressing Towards Goal  Goal: *Sick day guidelines  Outcome: Progressing Towards Goal  Goal: *Patient Specific Goal (EDIT GOAL, INSERT TEXT)  Outcome: Progressing Towards Goal     Problem: Patient Education: Go to Patient Education Activity  Goal: Patient/Family Education  Outcome: Progressing Towards Goal     Problem: Breathing Pattern - Ineffective  Goal: *Absence of hypoxia  Outcome: Progressing Towards Goal  Goal: *Use of effective breathing techniques  Outcome: Progressing Towards Goal     Problem: Falls - Risk of  Goal: *Absence of Falls  Description  Document Bridgette HabermannSchmid Fall Risk and appropriate interventions in the flowsheet.  Outcome: Progressing Towards Goal  Note:   Fall Risk Interventions:  Mobility Interventions: Bed/chair exit alarm         Medication Interventions: Bed/chair exit alarm    Elimination Interventions: Call light in reach, Bed/chair exit alarm              Problem: Patient Education: Go to Patient Education Activity  Goal: Patient/Family Education  Outcome: Progressing Towards Goal

## 2018-02-04 NOTE — ED Notes (Signed)
TRANSFER - OUT REPORT:    Verbal report given to Carolyn, RN (name) on Terren R Magley  being transferred to 4 west (unit) for routine progression of care       Report consisted of patient???s Situation, Background, Assessment and   Recommendations(SBAR).     Information from the following report(s) SBAR, ED Summary and MAR was reviewed with the receiving nurse.    Lines:   Peripheral IV 02/04/18 Right Antecubital (Active)        Opportunity for questions and clarification was provided.      Patient transported with:   [employer]

## 2018-02-04 NOTE — ED Notes (Signed)
Bedside report given to Sasha, RN

## 2018-02-04 NOTE — ED Triage Notes (Addendum)
Pt c/o SOB and abd pain, associated with increased work of breathing.     Pt husband states pt was discharged on Monday, diagnosed of hernia and scheduled to see her surgeon on Tuesday.    Husband states pt has not been well since discharge and presents this morning with asthmatic attack as well as abd pain.    Denies any N/V     RT at bedside

## 2018-02-04 NOTE — Progress Notes (Signed)
[facility]: Medication History    Medication History Completed Prior to Order Reconciliation?  YES  If "no" and discrepancies were noted please contact attending physician or pharmacist to follow-up: N/A - Medication history was obtained prior to reconciliation.    Information obtained from (list all that apply, 2 sources preferred): Patient and Other: Discharge papers from 8/19    If a history was not reviewed directly with patient/caregiver please comment with the reason why:      Antibiotic use in the last 90 days (3 months): YES      Missing Medication Identified  YES  Number of medications: 1  Indicate action taken: Updated Medication List   See PTA medication list for full audit history      Wrong Medication Identified NO   Number of medications:   Indicate action taken:       Wrong Dose/Interval/Route Identified NO              Number of medications:    Indicate action taken:         Is patient currently taking warfarin:  No        Medication Compliance Issues and/or Medication Concerns:       Allergies: Chocolate [cocoa]    Prior to Admission Medications:    Prior to Admission Medications   Prescriptions Last Dose Informant Patient Reported? Taking?   Blood Glucose Control High&Low (ACCU-CHEK AVIVA CONTROL SOLN) soln   No No   Sig: Check blood sugar daily   Blood-Glucose Meter (ACCU-CHEK AVIVA PLUS METER) misc   No No   Sig: Check blood sugar daily   Blood-Glucose Meter monitoring kit   No No   Sig: Check sugars 2 times per day, fasting and 2 hours after dinner   Insulin Needles, Disposable, (NANO PEN NEEDLE) 32 gauge x 5/32" ndle   No No   Sig: To use with Lantus pen   Lancets misc   No No   Sig: Check sugars twice daily   QUEtiapine SR (SEROQUEL XR) 150 mg sr tablet   No No   Sig: TAKE 1 TABLET BY MOUTH NIGHTLY   albuterol (ACCUNEB) 1.25 mg/3 mL nebu   No No   Sig: 3 mL by Nebulization route every six (6) hours as needed (SOB, wheezing). Indications: Asthma Attack    albuterol-ipratropium (DUO-NEB) 2.5 mg-0.5 mg/3 ml nebu   Yes No   Sig: 3 mL by Nebulization route.   alcohol swabs (BD SINGLE USE SWABS REGULAR) padm   No No   Sig: Check blood sugar daily   cloNIDine HCl (CATAPRES) 0.3 mg tablet   No No   Sig: Take bid   Patient taking differently: Take 1 Tab by mouth two (2) times a day. Take bid   dilTIAZem CD (CARDIZEM CD) 180 mg ER capsule   No No   Sig: Take 1 Cap by mouth daily.   fluticasone propion-salmeterol (ADVAIR DISKUS) 100-50 mcg/dose diskus inhaler   No No   Sig: Take 1 Puff by inhalation two (2) times a day.   gabapentin (NEURONTIN) 100 mg capsule   No No   Sig: TAKE 1 CAPSULE BY MOUTH THREE TIMES DAILY   glucose blood VI test strips (ACCU-CHEK AVIVA PLUS TEST STRP) strip   No No   Sig: Check blood sugar daily   glucose blood VI test strips (ASCENSIA AUTODISC VI, ONE TOUCH ULTRA TEST VI) strip   No No   Sig: Check sugars twice daily  insulin glargine (LANTUS SOLOSTAR U-100 INSULIN) 100 unit/mL (3 mL) inpn   No No   Sig: 32 Units by SubCUTAneous route nightly.   lancets (ACCU-CHEK SOFTCLIX LANCETS) misc   No No   Sig: Check blood sugar daily   losartan (COZAAR) 100 mg tablet   No No   Sig: TAKE 1 TABLET BY MOUTH DAILY FOR HYPERTENSION   magnesium oxide (MAG-OX) 400 mg tablet   No No   Sig: TAKE 1 TABLET BY MOUTH DAILY   metFORMIN ER (GLUCOPHAGE XR) 500 mg tablet   No No   Sig: Take 1 Tab by mouth two (2) times a day.   montelukast (SINGULAIR) 10 mg tablet   No No   Sig: TAKE 1 TABLET BY MOUTH DAILY   nabumetone (RELAFEN) 750 mg tablet   Yes No   Sig: Take 750 mg by mouth two (2) times a day.   nitrofurantoin, macrocrystal-monohydrate, (MACROBID) 100 mg capsule   No No   Sig: Take 1 Cap by mouth two (2) times a day for 7 days.   ondansetron (ZOFRAN ODT) 4 mg disintegrating tablet   No No   Sig: Take 1 Tab by mouth every eight (8) hours as needed for Nausea. Indications: nausea   oxyCODONE-acetaminophen (PERCOCET) 5-325 mg per tablet   Yes Yes    Sig: Take 1 Tab by mouth every six (6) hours as needed for Pain.   polyethylene glycol (MIRALAX) 17 gram packet   No No   Sig: Take 1 Packet by mouth daily.   rosuvastatin (CRESTOR) 5 mg tablet   No No   Sig: TAKE 1 TABLET BY MOUTH NIGHTLY   spironolactone (ALDACTONE) 100 mg tablet   No No   Sig: TAKE 1 TABLET BY MOUTH DAILY   tiotropium bromide (SPIRIVA RESPIMAT) 2.5 mcg/actuation inhaler   No No   Sig: Take 1 Puff by inhalation daily.   traZODone (DESYREL) 50 mg tablet   No No   Sig: Take 1 Tab by mouth nightly as needed for Sleep.   triamcinolone (NASACORT) 55 mcg nasal inhaler   No No   Sig: 2 Sprays by Both Nostrils route daily.      Facility-Administered Medications: None         Burr Medico, CPHT   Contact: [phone]

## 2018-02-04 NOTE — ED Notes (Signed)
 TRANSFER - OUT REPORT:    Verbal report given to Elveria, Charity fundraiser (name) on [name] [name]  being transferred to 4 west (unit) for routine progression of care       Report consisted of patient's Situation, Background, Assessment and   Recommendations(SBAR).     Information from the following report(s) SBAR, ED Summary and MAR was reviewed with the receiving nurse.    Lines:   Peripheral IV 02/04/18 Right Antecubital (Active)        Opportunity for questions and clarification was provided.      Patient transported with:   [employer]

## 2018-02-04 NOTE — Progress Notes (Signed)
[facility]: Medication History    Medication History Completed Prior to Order Reconciliation?  YES  If "no" and discrepancies were noted please contact attending physician or pharmacist to follow-up: N/A - Medication history was obtained prior to reconciliation.    Information obtained from (list all that apply, 2 sources preferred): Patient and Other: Discharge papers from 8/19    If a history was not reviewed directly with patient/caregiver please comment with the reason why:      Antibiotic use in the last 90 days (3 months): YES      Missing Medication Identified  YES  Number of medications: 1  Indicate action taken: Updated Medication List   See PTA medication list for full audit history      Wrong Medication Identified NO   Number of medications:   Indicate action taken:       Wrong Dose/Interval/Route Identified NO              Number of medications:    Indicate action taken:         Is patient currently taking warfarin:  No        Medication Compliance Issues and/or Medication Concerns:       Allergies: Chocolate [cocoa]    Prior to Admission Medications:    Prior to Admission Medications   Prescriptions Last Dose Informant Patient Reported? Taking?   Blood Glucose Control High&Low (ACCU-CHEK AVIVA CONTROL SOLN) soln   No No   Sig: Check blood sugar daily   Blood-Glucose Meter (ACCU-CHEK AVIVA PLUS METER) misc   No No   Sig: Check blood sugar daily   Blood-Glucose Meter monitoring kit   No No   Sig: Check sugars 2 times per day, fasting and 2 hours after dinner   Insulin Needles, Disposable, (NANO PEN NEEDLE) 32 gauge x 5/32" ndle   No No   Sig: To use with Lantus pen   Lancets misc   No No   Sig: Check sugars twice daily   QUEtiapine SR (SEROQUEL XR) 150 mg sr tablet   No No   Sig: TAKE 1 TABLET BY MOUTH NIGHTLY   albuterol (ACCUNEB) 1.25 mg/3 mL nebu   No No   Sig: 3 mL by Nebulization route every six (6) hours as needed (SOB, wheezing). Indications: Asthma Attack   albuterol-ipratropium  (DUO-NEB) 2.5 mg-0.5 mg/3 ml nebu   Yes No   Sig: 3 mL by Nebulization route.   alcohol swabs (BD SINGLE USE SWABS REGULAR) padm   No No   Sig: Check blood sugar daily   cloNIDine HCl (CATAPRES) 0.3 mg tablet   No No   Sig: Take bid   Patient taking differently: Take 1 Tab by mouth two (2) times a day. Take bid   dilTIAZem CD (CARDIZEM CD) 180 mg ER capsule   No No   Sig: Take 1 Cap by mouth daily.   fluticasone propion-salmeterol (ADVAIR DISKUS) 100-50 mcg/dose diskus inhaler   No No   Sig: Take 1 Puff by inhalation two (2) times a day.   gabapentin (NEURONTIN) 100 mg capsule   No No   Sig: TAKE 1 CAPSULE BY MOUTH THREE TIMES DAILY   glucose blood VI test strips (ACCU-CHEK AVIVA PLUS TEST STRP) strip   No No   Sig: Check blood sugar daily   glucose blood VI test strips (ASCENSIA AUTODISC VI, ONE TOUCH ULTRA TEST VI) strip   No No   Sig: Check sugars twice daily  insulin glargine (LANTUS SOLOSTAR U-100 INSULIN) 100 unit/mL (3 mL) inpn   No No   Sig: 32 Units by SubCUTAneous route nightly.   lancets (ACCU-CHEK SOFTCLIX LANCETS) misc   No No   Sig: Check blood sugar daily   losartan (COZAAR) 100 mg tablet   No No   Sig: TAKE 1 TABLET BY MOUTH DAILY FOR HYPERTENSION   magnesium oxide (MAG-OX) 400 mg tablet   No No   Sig: TAKE 1 TABLET BY MOUTH DAILY   metFORMIN ER (GLUCOPHAGE XR) 500 mg tablet   No No   Sig: Take 1 Tab by mouth two (2) times a day.   montelukast (SINGULAIR) 10 mg tablet   No No   Sig: TAKE 1 TABLET BY MOUTH DAILY   nabumetone (RELAFEN) 750 mg tablet   Yes No   Sig: Take 750 mg by mouth two (2) times a day.   nitrofurantoin, macrocrystal-monohydrate, (MACROBID) 100 mg capsule   No No   Sig: Take 1 Cap by mouth two (2) times a day for 7 days.   ondansetron (ZOFRAN ODT) 4 mg disintegrating tablet   No No   Sig: Take 1 Tab by mouth every eight (8) hours as needed for Nausea. Indications: nausea   oxyCODONE-acetaminophen (PERCOCET) 5-325 mg per tablet   Yes Yes   Sig: Take 1 Tab by mouth every six (6)  hours as needed for Pain.   polyethylene glycol (MIRALAX) 17 gram packet   No No   Sig: Take 1 Packet by mouth daily.   rosuvastatin (CRESTOR) 5 mg tablet   No No   Sig: TAKE 1 TABLET BY MOUTH NIGHTLY   spironolactone (ALDACTONE) 100 mg tablet   No No   Sig: TAKE 1 TABLET BY MOUTH DAILY   tiotropium bromide (SPIRIVA RESPIMAT) 2.5 mcg/actuation inhaler   No No   Sig: Take 1 Puff by inhalation daily.   traZODone (DESYREL) 50 mg tablet   No No   Sig: Take 1 Tab by mouth nightly as needed for Sleep.   triamcinolone (NASACORT) 55 mcg nasal inhaler   No No   Sig: 2 Sprays by Both Nostrils route daily.      Facility-Administered Medications: None         Erma Heritage, CPHT   Contact: [phone]

## 2018-02-04 NOTE — Progress Notes (Signed)
Pt admitted with abd pain and asthma exacerbations. Pt taking morphine and percocet for pain with partial relief. Pt dyspneic earlier but breathing has stabalized. Nebs per Rt. Pt with purewick. Husband at bedside.        Problem: Diabetes Self-Management  Goal: *Disease process and treatment process  Description  Define diabetes and identify own type of diabetes; list 3 options for treating diabetes.  Outcome: Progressing Towards Goal  Goal: *Incorporating nutritional management into lifestyle  Description  Describe effect of type, amount and timing of food on blood glucose; list 3 methods for planning meals.  Outcome: Progressing Towards Goal  Goal: *Incorporating physical activity into lifestyle  Description  State effect of exercise on blood glucose levels.  Outcome: Progressing Towards Goal  Goal: *Developing strategies to promote health/change behavior  Description  Define the ABC's of diabetes; identify appropriate screenings, schedule and personal plan for screenings.  Outcome: Progressing Towards Goal  Goal: *Using medications safely  Description  State effect of diabetes medications on diabetes; name diabetes medication taking, action and side effects.  Outcome: Progressing Towards Goal  Goal: *Monitoring blood glucose, interpreting and using results  Description  Identify recommended blood glucose targets  and personal targets.  Outcome: Progressing Towards Goal  Goal: *Prevention, detection, treatment of acute complications  Description  List symptoms of hyper- and hypoglycemia; describe how to treat low blood sugar and actions for lowering  high blood glucose level.  Outcome: Progressing Towards Goal  Goal: *Prevention, detection and treatment of chronic complications  Description  Define the natural course of diabetes and describe the relationship of blood glucose levels to long term complications of diabetes.  Outcome: Progressing Towards Goal  Goal: *Developing strategies to address psychosocial  issues  Description  Describe feelings about living with diabetes; identify support needed and support network  Outcome: Progressing Towards Goal  Goal: *Insulin pump training  Outcome: Progressing Towards Goal  Goal: *Sick day guidelines  Outcome: Progressing Towards Goal  Goal: *Patient Specific Goal (EDIT GOAL, INSERT TEXT)  Outcome: Progressing Towards Goal     Problem: Patient Education: Go to Patient Education Activity  Goal: Patient/Family Education  Outcome: Progressing Towards Goal     Problem: Breathing Pattern - Ineffective  Goal: *Absence of hypoxia  Outcome: Progressing Towards Goal  Goal: *Use of effective breathing techniques  Outcome: Progressing Towards Goal     Problem: Falls - Risk of  Goal: *Absence of Falls  Description  Document Bridgette HabermannSchmid Fall Risk and appropriate interventions in the flowsheet.  Outcome: Progressing Towards Goal  Note:   Fall Risk Interventions:  Mobility Interventions: Bed/chair exit alarm         Medication Interventions: Bed/chair exit alarm    Elimination Interventions: Call light in reach, Bed/chair exit alarm              Problem: Patient Education: Go to Patient Education Activity  Goal: Patient/Family Education  Outcome: Progressing Towards Goal
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
## 2018-02-04 NOTE — H&P (Signed)
Medicine History and Physical    Patient: [name] [name]   Age:  67 y.o.    Assessment / Plan   Anterior abdominal wall incarcerated fat hernia  Abdominal pain  Acute COPD exacerbation mild  Diabetes type 2 uncontrolled with hyperglycemia  Hypertension  GERD  Chronic pain syndrome  Hepatitis C    -Surgery evaluation for abdominal pain due to hernia  -Analgesia as needed  -Short course of prednisone  -Duo-neb RTC  -Albuterol Nebs PRN  -Supplemental O2 as needed  -No indication for Abx   -Glucomander for blood sugar control  -New home meds for hypertension        DISPO  -Pt to be admitted  at this time for reasons addressed above, continued hospitalization for ongoing assessment and treatment indicated     Anticipated Date of Discharge: 2 to 3 days  Anticipated Disposition (home, SNF) : Home    Chief Complaint:   Chief Complaint   Patient presents with   ??? Shortness of Breath   ??? Abdominal Pain         HPI:   [name] [name] is a 67 y.o. year old female who presents with complaint of abdominal pain.  Patient was recently seen here in the emergency department at [facility] where she was evaluated for abdominal pain, it was found it was due to abdominal wall hernia that was fat-containing and incarcerated.  Patient was discharged home with follow-up with surgery and appointment has been made and is scheduled for upcoming Tuesday, but since leaving the emergency department her pain is increased to the point that she felt she needed to return today.  Presented today patient was noted to be short of breath placed briefly on BiPAP and given nebulizers with control of shortness of breath.  Patient husband states her breathing is been fine up until he left the house today but in the rush to get out of the house she became acutely short of breath.  Given Valaria Good patient COPD exacerbation and inability to control pain at home patient was recommended for admission for additional work-up and treatment  for abdominal pain and COPD exacerbation      Review of Systems - positive responses in bold type   Constitutional: Negative for fever, chills, diaphoresis and unexpected weight change.   HENT: Negative for ear pain, congestion, sore throat, rhinorrhea, drooling, trouble swallowing, neck pain and tinnitus.   Eyes: Negative for photophobia, pain, redness and visual disturbance.   Respiratory:shortness of breath, negative for  cough, choking, chest tightness, wheezing or stridor.   Cardiovascular: Negative for chest pain, palpitations and leg swelling.   Gastrointestinal:abdominal pain Negative for nausea, vomiting, , diarrhea, constipation, blood in stool, abdominal distention and anal bleeding.   Genitourinary: Negative for dysuria, urgency, frequency, hematuria, flank pain and difficulty urinating.   Musculoskeletal: Negative for back pain and arthralgias.   Skin: Negative for color change, rash and wound.   Neurological: Negative for dizziness, seizures, syncope, speech difficulty, light-headedness or headaches.   Hematological: Does not bruise/bleed easily.   Psychiatric/Behavioral: Negative for suicidal ideas, hallucinations, behavioral problems, self-injury or agitation       Past Medical History:  Past Medical History:   Diagnosis Date   ??? Arthritis    ??? 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 COPD (chronic obstructive pulmonary disease) ([place])    ??? DM2 (diabetes mellitus, type 2) ([place])    ??? GERD (  gastroesophageal reflux disease)    ??? Hepatitis C     HEP C   ??? HTN (hypertension)    ??? Lung nodule, multiple     (CT 10/2016) Small left upper lobe and 1.7 x 1.6 cm left lower lobe nodules not significantly changed from 07/23/2016 and 03/22/2016   ??? Marijuana use    ??? PTSD (post-traumatic stress disorder)     lived through the [place] in 1993   ??? Thoracic ascending aortic aneurysm ([place])     4.4cm noted on CT Chest (10/2016)   ??? Thyroid nodule     2.5 cm stable right thyroid nodule  (CT 10/2016)       Past Surgical History:  Past Surgical History:   Procedure Laterality Date   ??? HX ENDOSCOPY     ??? HX HIP REPLACEMENT Right 01/29/2015   ??? HX HIP REPLACEMENT     ??? HX HYSTERECTOMY  2010    hysterectomy       Family History:  Family History   Problem Relation Age of Onset   ??? Hypertension Mother    ??? Other Mother         OSA   ??? Cancer Father         suspected leukemia per pt's spouse   ??? Cancer Maternal Aunt    ??? Alcohol abuse Maternal Grandfather        Social History:  Social History     Socioeconomic History   ??? Marital status: MARRIED     Spouse name: Not on file   ??? Number of children: Not on file   ??? Years of education: Not on file   ??? Highest education level: Not on file   Tobacco Use   ??? Smoking status: Former Smoker   ??? Smokeless tobacco: Never Used   ??? Tobacco comment: reported as quit smoking around 1990s per spouse   Substance and Sexual Activity   ??? Alcohol use: No   ??? Drug use: Yes     Types: Marijuana   ??? Sexual activity: Never       Home Medications:  Prior to Admission medications    Medication Sig Start Date End Date Taking? Authorizing Provider   oxyCODONE-acetaminophen (PERCOCET) 5-325 mg per tablet Take 1 Tab by mouth every six (6) hours as needed for Pain.   Yes Provider, Historical   nitrofurantoin, macrocrystal-monohydrate, (MACROBID) 100 mg capsule Take 1 Cap by mouth two (2) times a day for 7 days. 02/01/18 02/08/18  Porfirio Mylar P, PA-C   insulin glargine (LANTUS SOLOSTAR U-100 INSULIN) 100 unit/mL (3 mL) inpn 32 Units by SubCUTAneous route nightly. 01/19/18   Wallene Huh, PA-C   metFORMIN ER (GLUCOPHAGE XR) 500 mg tablet Take 1 Tab by mouth two (2) times a day. 01/08/18   Wallene Huh, PA-C   gabapentin (NEURONTIN) 100 mg capsule TAKE 1 CAPSULE BY MOUTH THREE TIMES DAILY 01/08/18   Wallene Huh, PA-C   Insulin Needles, Disposable, (NANO PEN NEEDLE) 32 gauge x 5/32" ndle To use with Lantus pen 01/03/18   Wallene Huh, PA-C   dilTIAZem CD (CARDIZEM CD) 180 mg ER  capsule Take 1 Cap by mouth daily. 01/02/18   Wallene Huh, PA-C   montelukast (SINGULAIR) 10 mg tablet TAKE 1 TABLET BY MOUTH DAILY 01/02/18   Wallene Huh, PA-C   spironolactone (ALDACTONE) 100 mg tablet TAKE 1 TABLET BY MOUTH DAILY 01/02/18   Wallene Huh, PA-C   ondansetron (ZOFRAN ODT) 4 mg  disintegrating tablet Take 1 Tab by mouth every eight (8) hours as needed for Nausea. Indications: nausea 12/24/17   Hilton Sinclair, PA-C   albuterol (ACCUNEB) 1.25 mg/3 mL nebu 3 mL by Nebulization route every six (6) hours as needed (SOB, wheezing). Indications: Asthma Attack 12/07/17   Wallene Huh, PA-C   nabumetone (RELAFEN) 750 mg tablet Take 750 mg by mouth two (2) times a day.    Provider, Historical   albuterol-ipratropium (DUO-NEB) 2.5 mg-0.5 mg/3 ml nebu 3 mL by Nebulization route.    Provider, Historical   Blood Glucose Control High&Low (ACCU-CHEK AVIVA CONTROL SOLN) soln Check blood sugar daily 11/23/17   Wallene Huh, PA-C   Blood-Glucose Meter (ACCU-CHEK AVIVA PLUS METER) misc Check blood sugar daily 11/23/17   Wallene Huh, PA-C   glucose blood VI test strips (ACCU-CHEK AVIVA PLUS TEST STRP) strip Check blood sugar daily 11/23/17   Wallene Huh, PA-C   alcohol swabs (BD SINGLE USE SWABS REGULAR) padm Check blood sugar daily 11/23/17   Wallene Huh, PA-C   lancets (ACCU-CHEK SOFTCLIX LANCETS) misc Check blood sugar daily 11/23/17   Wallene Huh, PA-C   tiotropium bromide (SPIRIVA RESPIMAT) 2.5 mcg/actuation inhaler Take 1 Puff by inhalation daily. 10/11/17   Holladay, Lorra Hals, MD   cloNIDine HCl (CATAPRES) 0.3 mg tablet Take bid  Patient taking differently: Take 1 Tab by mouth two (2) times a day. Take bid 10/11/17   Holladay, Lorra Hals, MD   fluticasone propion-salmeterol (ADVAIR DISKUS) 100-50 mcg/dose diskus inhaler Take 1 Puff by inhalation two (2) times a day. 10/11/17   Holladay, Lorra Hals, MD   magnesium oxide (MAG-OX) 400 mg tablet TAKE 1 TABLET BY MOUTH DAILY 08/14/17   Caryl Bis, FNP    traZODone (DESYREL) 50 mg tablet Take 1 Tab by mouth nightly as needed for Sleep. 08/09/17   Einar Grad, MD   losartan (COZAAR) 100 mg tablet TAKE 1 TABLET BY MOUTH DAILY FOR HYPERTENSION 07/17/17   Lake Bells, MD   QUEtiapine SR (SEROQUEL XR) 150 mg sr tablet TAKE 1 TABLET BY MOUTH NIGHTLY 07/13/17   [place] Bells, MD   rosuvastatin (CRESTOR) 5 mg tablet TAKE 1 TABLET BY MOUTH NIGHTLY 07/13/17   [place] Bells, MD   polyethylene glycol (MIRALAX) 17 gram packet Take 1 Packet by mouth daily. 05/09/17   [place] Bells, MD   triamcinolone (NASACORT) 55 mcg nasal inhaler 2 Sprays by Both Nostrils route daily. 02/22/17   Wallene Huh, PA-C   Blood-Glucose Meter monitoring kit Check sugars 2 times per day, fasting and 2 hours after dinner 07/15/15   [place] Bells, MD   Lancets misc Check sugars twice daily 07/15/15   [place] Bells, MD   glucose blood VI test strips (ASCENSIA AUTODISC VI, ONE TOUCH ULTRA TEST VI) strip Check sugars twice daily 07/15/15   [place] Bells, MD       Allergies:  Allergies   Allergen Reactions   ??? Chocolate [Cocoa] Sneezing           Physical Exam:     Visit Vitals  BP (!) 156/115   Pulse (!) 103   Temp 97.4 ??F (36.3 ??C)   Resp 17   SpO2 94%       Physical Exam:  General appearance: alert, cooperative, no distress, appears stated age  Head: Normocephalic, without obvious abnormality, atraumatic  Neck: supple, trachea midline  Lungs: Diminished air sounds heard throughout no wheezing appreciated  Heart: regular  rate and rhythm, S1, S2 normal, no murmur, click, rub or gallop  Abdomen: soft, tender with approximately 5 x 5 cm firmness felt just above the umbilicus, no attempt was made to reduce this  Extremities: extremities normal, atraumatic, no cyanosis or edema  Skin: Skin color, texture, turgor normal. No rashes or lesions in exposed areas   Neurologic: Grossly normal  Lymph: no palpable LAD in cervical or axillary region  PSY: mood and affect normal, appropriately  behaved     Intake and Output:  Current Shift:  No intake/output data recorded.  Last three shifts:  No intake/output data recorded.    All labs and imaging results for the last 24 hours reviewed.      Stephanie Coup, MD  P# [phone]  February 04, 2018      [facility] medical dictation software was used for portions of this report.  Unintended voice transcription errors may have occurred.

## 2018-02-04 NOTE — ED Notes (Signed)
Pt c/o SOB and abd pain, associated with increased work of breathing.     Pt husband states pt was discharged on Monday, diagnosed of hernia and scheduled to see her surgeon on Tuesday.    Husband states pt has not been well since discharge and presents this morning with asthmatic attack as well as abd pain.    Denies any N/V     RT at bedside

## 2018-02-05 LAB — EKG 12-LEAD
Atrial Rate: 88 {beats}/min
P Axis: 79 degrees
P-R Interval: 162 ms
Q-T Interval: 378 ms
QRS Duration: 70 ms
QTc Calculation (Bazett): 457 ms
R Axis: 44 degrees
T Axis: 51 degrees
Ventricular Rate: 88 {beats}/min

## 2018-02-05 LAB — POCT GLUCOSE
POC Glucose: 255 mg/dL — ABNORMAL HIGH (ref 65–105)
POC Glucose: 152 mg/dL — ABNORMAL HIGH (ref 65–105)
POC Glucose: 156 mg/dL — ABNORMAL HIGH (ref 65–105)
POC Glucose: 202 mg/dL — ABNORMAL HIGH (ref 65–105)

## 2018-02-05 LAB — GLUCOSE, POC
Glucose (POC): 255 mg/dL — ABNORMAL HIGH (ref 65–105)
Glucose (POC): 152 mg/dL — ABNORMAL HIGH (ref 65–105)
Glucose (POC): 156 mg/dL — ABNORMAL HIGH (ref 65–105)
Glucose (POC): 202 mg/dL — ABNORMAL HIGH (ref 65–105)

## 2018-02-05 LAB — EKG, 12 LEAD, INITIAL
Atrial Rate: 88 {beats}/min
Calculated P Axis: 79 degrees
Calculated R Axis: 44 degrees
Calculated T Axis: 51 degrees
P-R Interval: 162 ms
Q-T Interval: 378 ms
QRS Duration: 70 ms
QTC Calculation (Bezet): 457 ms
Ventricular Rate: 88 {beats}/min

## 2018-02-05 MED ORDER — CLONIDINE 0.3 MG TAB
0.3 mg | ORAL_TABLET | Freq: Two times a day (BID) | ORAL | 2 refills | Status: DC
Start: 2018-02-05 — End: 2018-02-08

## 2018-02-05 MED ORDER — FUROSEMIDE 40 MG TAB
40 mg | Freq: Every day | ORAL | Status: DC
Start: 2018-02-05 — End: 2018-02-08
  Administered 2018-02-05 – 2018-02-08 (×4): via ORAL

## 2018-02-05 MED ORDER — IPRATROPIUM-ALBUTEROL 2.5 MG-0.5 MG/3 ML NEB SOLUTION
2.5 mg-0.5 mg/3 ml | Freq: Three times a day (TID) | RESPIRATORY_TRACT | Status: DC
Start: 2018-02-05 — End: 2018-02-08
  Administered 2018-02-06 – 2018-02-08 (×9): via RESPIRATORY_TRACT

## 2018-02-05 MED ORDER — LOSARTAN 100 MG TAB
100 mg | ORAL_TABLET | ORAL | 0 refills | Status: DC
Start: 2018-02-05 — End: 2018-02-08

## 2018-02-05 MED FILL — CLONIDINE 0.1 MG TAB: 0.1 mg | ORAL | Qty: 3

## 2018-02-05 MED FILL — MORPHINE 2 MG/ML INJECTION: 2 mg/mL | INTRAMUSCULAR | Qty: 2

## 2018-02-05 MED FILL — LOSARTAN 50 MG TAB: 50 mg | ORAL | Qty: 2

## 2018-02-05 MED FILL — GABAPENTIN 100 MG CAP: 100 mg | ORAL | Qty: 1

## 2018-02-05 MED FILL — INSULIN GLARGINE 100 UNIT/ML INJECTION: 100 unit/mL | SUBCUTANEOUS | Qty: 1

## 2018-02-05 MED FILL — QUETIAPINE SR 50 MG 24 HR TAB: 50 mg | ORAL | Qty: 3

## 2018-02-05 MED FILL — HEPARIN (PORCINE) 5,000 UNIT/ML IJ SOLN: 5000 unit/mL | INTRAMUSCULAR | Qty: 1

## 2018-02-05 MED FILL — DIPHENHYDRAMINE HCL 50 MG/ML IJ SOLN: 50 mg/mL | INTRAMUSCULAR | Qty: 1

## 2018-02-05 MED FILL — ROSUVASTATIN 5 MG TAB: 5 mg | ORAL | Qty: 1

## 2018-02-05 MED FILL — FUROSEMIDE 40 MG TAB: 40 mg | ORAL | Qty: 1

## 2018-02-05 MED FILL — MORPHINE 2 MG/ML INJECTION: 2 mg/mL | INTRAMUSCULAR | Qty: 1

## 2018-02-05 MED FILL — OXYCODONE-ACETAMINOPHEN 10 MG-325 MG TAB: 10-325 mg | ORAL | Qty: 1

## 2018-02-05 MED FILL — MONTELUKAST 10 MG TAB: 10 mg | ORAL | Qty: 1

## 2018-02-05 MED FILL — DILTIAZEM ER 180 MG 24 HR CAP: 180 mg | ORAL | Qty: 1

## 2018-02-05 MED FILL — PREDNISONE 20 MG TAB: 20 mg | ORAL | Qty: 2

## 2018-02-05 MED FILL — SPIRONOLACTONE 100 MG TAB: 100 mg | ORAL | Qty: 1

## 2018-02-05 MED FILL — IPRATROPIUM-ALBUTEROL 2.5 MG-0.5 MG/3 ML NEB SOLUTION: 2.5 mg-0.5 mg/3 ml | RESPIRATORY_TRACT | Qty: 3

## 2018-02-05 MED FILL — TRAZODONE 50 MG TAB: 50 mg | ORAL | Qty: 1

## 2018-02-05 MED FILL — INSULIN LISPRO 100 UNIT/ML INJECTION: 100 unit/mL | SUBCUTANEOUS | Qty: 1

## 2018-02-05 NOTE — Progress Notes (Signed)
Dr. MacGregor on unit. Notified of pt continues to have DBP in 100's.

## 2018-02-05 NOTE — Progress Notes (Signed)
Dr. MacGregor paged re pt elevated BP. Await response.

## 2018-02-05 NOTE — Progress Notes (Signed)
Medicine Progress Note    Patient: [name] ShutterMonica R [name]   Age:  67 y.o.  DOA: 02/04/2018     LOS:  LOS: 1 day     Assessment/Plan   Anterior abdominal wall incarcerated fat hernia  Abdominal pain  Acute COPD exacerbation mild  Diabetes type 2 uncontrolled with hyperglycemia  Hypertension  GERD  Chronic pain syndrome  Hepatitis C    -Surgery evaluation for abdominal pain due to hernia, discussed with Dr. Marlowe AltAlireza Farpour at this point no indication for immediate intervention, this would best done electively when patient recovers from COPD exacerbation and off all steroids  -Analgesia as needed  -Short course of prednisone  -Duo-neb RTC  -Albuterol Nebs PRN  -Supplemental O2 as needed  -Glucomander for blood sugar control  -Continue home meds for hypertension, add Lasix for elevated diastolic blood pressures        Subjective:   Patient seen and examined. No complaints, abdominal pain better controlled no N/V, F/C, CP or SOB     Objective:     Visit Vitals  BP (!) 139/112 (BP 1 Location: Left arm, BP Patient Position: Supine)   Pulse (!) 112   Temp 98.8 ??F (37.1 ??C)   Resp 20   SpO2 92%       Physical Exam:  General appearance: alert, cooperative, no distress, appears stated age  Head: Normocephalic, without obvious abnormality, atraumatic  Neck: supple, trachea midline  Lungs: Poor air movement throughout, no wheezing appreciated  Heart: regular rate and rhythm, S1, S2 normal, no murmur, click, rub or gallop  Abdomen: soft, firmness felt in abdomen just above the umbilicus non-tender. Bowel sounds normal.   no organomegaly  Extremities: extremities normal, atraumatic, no cyanosis or edema      Medications Reviewed:  Current Facility-Administered Medications   Medication Dose Route Frequency   ??? furosemide (LASIX) tablet 40 mg  40 mg Oral DAILY   ??? albuterol-ipratropium (DUO-NEB) 2.5 MG-0.5 MG/3 ML  3 mL Nebulization TID RT   ??? cloNIDine HCl (CATAPRES) tablet 0.3 mg  0.3 mg Oral BID    ??? dilTIAZem CD (CARDIZEM CD) capsule 180 mg  180 mg Oral DAILY   ??? fluticasone-vilanterol (BREO ELLIPTA) 16800mcg-25mcg/puff  1 Puff Inhalation DAILY   ??? gabapentin (NEURONTIN) capsule 100 mg  100 mg Oral TID   ??? losartan (COZAAR) tablet 100 mg  100 mg Oral DAILY   ??? montelukast (SINGULAIR) tablet 10 mg  10 mg Oral QHS   ??? QUEtiapine SR (SEROquel XR) tablet 150 mg  150 mg Oral QHS   ??? traZODone (DESYREL) tablet 50 mg  50 mg Oral QHS PRN   ??? spironolactone (ALDACTONE) tablet 100 mg  100 mg Oral DAILY   ??? rosuvastatin (CRESTOR) tablet 5 mg  5 mg Oral QHS   ??? naloxone (NARCAN) injection 0.1 mg  0.1 mg IntraVENous PRN   ??? dextrose (D50) infusion 5-25 g  10-50 mL IntraVENous PRN   ??? glucagon (GLUCAGEN) injection 1 mg  1 mg IntraMUSCular PRN   ??? acetaminophen (TYLENOL) tablet 650 mg  650 mg Oral Q4H PRN    Or   ??? acetaminophen (TYLENOL) solution 650 mg  650 mg Oral Q4H PRN    Or   ??? acetaminophen (TYLENOL) suppository 650 mg  650 mg Rectal Q4H PRN   ??? morphine injection 2-4 mg  2-4 mg IntraVENous Q4H PRN   ??? oxyCODONE-acetaminophen (PERCOCET 10) 10-325 mg per tablet 1 Tab  1 Tab Oral Q4H PRN   ???  diphenhydrAMINE (BENADRYL) injection 25 mg  25 mg IntraVENous Q4H PRN   ??? ondansetron (ZOFRAN) injection 4 mg  4 mg IntraVENous Q4H PRN   ??? heparin (porcine) injection 5,000 Units  5,000 Units SubCUTAneous Q8H   ??? insulin glargine (LANTUS) injection 1-100 Units  1-100 Units SubCUTAneous QHS   ??? insulin lispro (HUMALOG) injection 1-100 Units  1-100 Units SubCUTAneous AC&HS   ??? insulin lispro (HUMALOG) injection 1-100 Units  1-100 Units SubCUTAneous PRN   ??? predniSONE (DELTASONE) tablet 40 mg  40 mg Oral DAILY WITH BREAKFAST   ??? albuterol (PROVENTIL VENTOLIN) nebulizer solution 2.5 mg  2.5 mg Nebulization Q4H PRN         Intake and Output:  Current Shift:  08/26 0701 - 08/26 1900  In: 1200 [P.O.:1200]  Out: 700 [Urine:700]  Last three shifts:  08/24 1901 - 08/26 0700  In: -   Out: 1100 [Urine:1100]    Lab/Data Reviewed:   All lab and imaging  results for the last 24 hours reviewed.    Kristie Cowman, MD  P# [phone]  February 05, 2018    [facility] medical dictation software was used for portions of this report.  Unintended voice transcription errors may have occurred.

## 2018-02-05 NOTE — Consults (Signed)
[facility]  [phone]    CONSULT NOTE    Today's date: February 05, 2018  Patient: [name] [name]                 Sex: female        Date of Birth:  May 22, 1951      Age:  67 y.o.          DOA: 02/04/2018  LOS:  LOS: 1 day         Assessment/Plan     Ventral hernia. This contains only fat. This is her second visit and originally had an appt to see Dr Eulas Post tomorrow.   This is not a surgical emergency and would not recommend fixing her hernia at the time of a COPD exacerbationeven if mild  as it increases the chances of a recurrence.  She has had this hernia for over ten years.  Also ideally it would be fixed with mesh; howeve,r her current steroid bolus increases her chances of a wound infection and poor healing.  Again this is not the ideal time to fix a non-emergent ten year old hernia that only contains fat.  Would recommend elective repair when she is off steroids and stable from a respiratory viewpoint             HPI:     [name] [name] is a 67 y.o. female  Asked to see by ER Physicians.  She apparently presented last week with epigastric pain and found on ct to have a fat containing ventral hernia. She was discharged and was supposed to follow up with Dr Eulas Post Tuesday.   Now complaining of increased pain and also shortness of breath.   Wbc normal.   Repeat CT again reveals epigastric hernia containing fat.   She denies other GI symptoms.   Only abdominal surgery is hysterectomy.   Ahe does have COPD   Quit smoking but still used marijuana regularly and says she coughs after use.   Mild constipation   No lifting.      Past Medical History:   Diagnosis Date   ??? Arthritis    ??? 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 COPD (chronic obstructive pulmonary disease) ([place])    ??? DM2 (diabetes mellitus, type 2) ([place])    ??? GERD (gastroesophageal reflux disease)    ??? Hepatitis C     HEP C   ??? HTN (hypertension)    ??? Lung nodule, multiple      (CT 10/2016) Small left upper lobe and 1.7 x 1.6 cm left lower lobe nodules not significantly changed from 07/23/2016 and 03/22/2016   ??? Marijuana use    ??? PTSD (post-traumatic stress disorder)     lived through the [place] in 1993   ??? Thoracic ascending aortic aneurysm ([place])     4.4cm noted on CT Chest (10/2016)   ??? Thyroid nodule     2.5 cm stable right thyroid nodule (CT 10/2016)       Past Surgical History:   Procedure Laterality Date   ??? HX ENDOSCOPY     ??? HX HIP REPLACEMENT Right 01/29/2015   ??? HX HIP REPLACEMENT     ??? HX HYSTERECTOMY  2010    hysterectomy       Social History     Tobacco Use   ??? Smoking status: Former Smoker   ??? Smokeless tobacco: Never Used   ???  Tobacco comment: reported as quit smoking around 1990s per spouse   Substance Use Topics   ??? Alcohol use: No         Prior to Admission medications    Medication Sig Start Date End Date Taking? Authorizing Provider   oxyCODONE-acetaminophen (PERCOCET) 5-325 mg per tablet Take 1 Tab by mouth every six (6) hours as needed for Pain.   Yes Provider, Historical   nitrofurantoin, macrocrystal-monohydrate, (MACROBID) 100 mg capsule Take 1 Cap by mouth two (2) times a day for 7 days. 02/01/18 02/08/18 Yes Hendrick, Gaspar Bidding P, PA-C   insulin glargine (LANTUS SOLOSTAR U-100 INSULIN) 100 unit/mL (3 mL) inpn 32 Units by SubCUTAneous route nightly. 01/19/18  Yes Wallene Huh, PA-C   metFORMIN ER (GLUCOPHAGE XR) 500 mg tablet Take 1 Tab by mouth two (2) times a day. 01/08/18  Yes Brock, Monique, PA-C   gabapentin (NEURONTIN) 100 mg capsule TAKE 1 CAPSULE BY MOUTH THREE TIMES DAILY 01/08/18  Yes Wallene Huh, PA-C   dilTIAZem CD (CARDIZEM CD) 180 mg ER capsule Take 1 Cap by mouth daily. 01/02/18  Yes Fransisco Beau, Monique, PA-C   montelukast (SINGULAIR) 10 mg tablet TAKE 1 TABLET BY MOUTH DAILY 01/02/18  Yes Wallene Huh, PA-C   spironolactone (ALDACTONE) 100 mg tablet TAKE 1 TABLET BY MOUTH DAILY 01/02/18  Yes Fransisco Beau, Monique, PA-C    ondansetron (ZOFRAN ODT) 4 mg disintegrating tablet Take 1 Tab by mouth every eight (8) hours as needed for Nausea. Indications: nausea 12/24/17  Yes Hilton Sinclair, PA-C   albuterol (ACCUNEB) 1.25 mg/3 mL nebu 3 mL by Nebulization route every six (6) hours as needed (SOB, wheezing). Indications: Asthma Attack 12/07/17  Yes Wallene Huh, PA-C   nabumetone (RELAFEN) 750 mg tablet Take 750 mg by mouth two (2) times a day.   Yes Provider, Historical   albuterol-ipratropium (DUO-NEB) 2.5 mg-0.5 mg/3 ml nebu 3 mL by Nebulization route.   Yes Provider, Historical   tiotropium bromide (SPIRIVA RESPIMAT) 2.5 mcg/actuation inhaler Take 1 Puff by inhalation daily. 10/11/17  Yes Holladay, Lorra Hals, MD   fluticasone propion-salmeterol (ADVAIR DISKUS) 100-50 mcg/dose diskus inhaler Take 1 Puff by inhalation two (2) times a day. 10/11/17  Yes Holladay, Lorra Hals, MD   magnesium oxide (MAG-OX) 400 mg tablet TAKE 1 TABLET BY MOUTH DAILY 08/14/17  Yes Caryl Bis, FNP   traZODone (DESYREL) 50 mg tablet Take 1 Tab by mouth nightly as needed for Sleep. 08/09/17  Yes Einar Grad, MD   QUEtiapine SR (SEROQUEL XR) 150 mg sr tablet TAKE 1 TABLET BY MOUTH NIGHTLY 07/13/17  Yes Tharakan, Jolson K, MD   rosuvastatin (CRESTOR) 5 mg tablet TAKE 1 TABLET BY MOUTH NIGHTLY 07/13/17  Yes Tharakan, Jolson K, MD   polyethylene glycol (MIRALAX) 17 gram packet Take 1 Packet by mouth daily. 05/09/17  Yes Lake Bells, MD   triamcinolone (NASACORT) 55 mcg nasal inhaler 2 Sprays by Both Nostrils route daily. 02/22/17  Yes Fransisco Beau, Monique, PA-C   losartan (COZAAR) 100 mg tablet TAKE 1 TABLET BY MOUTH DAILY FOR HYPERTENSION 02/05/18   Wallene Huh, PA-C   cloNIDine HCl (CATAPRES) 0.3 mg tablet Take 1 Tab by mouth two (2) times a day. Take bid 02/05/18   Wallene Huh, PA-C   Insulin Needles, Disposable, (NANO PEN NEEDLE) 32 gauge x 5/32" ndle To use with Lantus pen 01/03/18   Wallene Huh, PA-C    Blood Glucose Control High&Low (ACCU-CHEK AVIVA CONTROL SOLN) soln Check blood sugar daily 11/23/17  Wallene Huh, PA-C   Blood-Glucose Meter (ACCU-CHEK AVIVA PLUS METER) misc Check blood sugar daily 11/23/17   Wallene Huh, PA-C   glucose blood VI test strips (ACCU-CHEK AVIVA PLUS TEST STRP) strip Check blood sugar daily 11/23/17   Wallene Huh, PA-C   alcohol swabs (BD SINGLE USE SWABS REGULAR) padm Check blood sugar daily 11/23/17   Wallene Huh, PA-C   lancets (ACCU-CHEK SOFTCLIX LANCETS) misc Check blood sugar daily 11/23/17   Wallene Huh, PA-C   Blood-Glucose Meter monitoring kit Check sugars 2 times per day, fasting and 2 hours after dinner 07/15/15   [place] Bells, MD   Lancets misc Check sugars twice daily 07/15/15   [place] Bells, MD   glucose blood VI test strips (ASCENSIA AUTODISC VI, ONE TOUCH ULTRA TEST VI) strip Check sugars twice daily 07/15/15   [place] Bells, MD       Allergies   Allergen Reactions   ??? Chocolate [Cocoa] Sneezing     REVIEW OF SYSTEMS:  CONSTITUTIONAL:?? (-)fever  (-)fatigue.  EYES:?? (-)blindness    (-)blurry vision.  UPPER RESPIRATORY:?? (-)sore throat   (-)runny nose.  LUNGS:?? (+)shortness of breath     (-) wheezing.  CARDIAC:?? (-)chest pain     (-)palpitations.  GASTROINTESTINAL:?? (+)abdominal pain    (-)nause   (-) vomiting   (-)Hematemesis.??  (-) jaundice.?? ??  GENITOURINARY:?? (-)frequency   (-)dysuria.  NEUROLOGIC:??(-)headaches   (-)seizures  SKIN:?? (-)new skin lesions    (-)breast masses.      PHYSICAL EXAMINATION:  GENERAL:??well-developed, well-nourishedfemale in no apparent ??  distress.  MOST RECENT VITAL SIGNS:??  Visit Vitals  BP (!) 139/112 (BP 1 Location: Left arm, BP Patient Position: Supine)   Pulse 96   Temp 99.1 ??F (37.3 ??C)   Resp 16   SpO2 100%         HEENT:?? sclerae are clear.?? There is no jaundice.  UPPER RESPIRATORY:?? There are  no obvious external nose or ear lesions.  NECK:?? Supple, with midline trachea.?? ??no masses   LYMPH?? NODES:?? Reveal no palpable cervical nodes.??no palpable groin ??  Nodes.  CV: no AAA   Good femoral pulses  ABDOMEN:?? Soft.??    no distention   no masses  no hepato-splenomegally  Epigastric hernia tender  pfannensteal incision          Darral Dash, MD  February 05, 2018  8:52 PM

## 2018-02-05 NOTE — Progress Notes (Signed)
Problem: Diabetes Self-Management  Goal: *Disease process and treatment process  Description  Define diabetes and identify own type of diabetes; list 3 options for treating diabetes.  Outcome: Progressing Towards Goal  Goal: *Incorporating nutritional management into lifestyle  Description  Describe effect of type, amount and timing of food on blood glucose; list 3 methods for planning meals.  Outcome: Progressing Towards Goal  Goal: *Incorporating physical activity into lifestyle  Description  State effect of exercise on blood glucose levels.  Outcome: Progressing Towards Goal  Goal: *Developing strategies to promote health/change behavior  Description  Define the ABC's of diabetes; identify appropriate screenings, schedule and personal plan for screenings.  Outcome: Progressing Towards Goal  Goal: *Using medications safely  Description  State effect of diabetes medications on diabetes; name diabetes medication taking, action and side effects.  Outcome: Progressing Towards Goal  Goal: *Monitoring blood glucose, interpreting and using results  Description  Identify recommended blood glucose targets  and personal targets.  Outcome: Progressing Towards Goal  Goal: *Prevention, detection, treatment of acute complications  Description  List symptoms of hyper- and hypoglycemia; describe how to treat low blood sugar and actions for lowering  high blood glucose level.  Outcome: Progressing Towards Goal  Goal: *Prevention, detection and treatment of chronic complications  Description  Define the natural course of diabetes and describe the relationship of blood glucose levels to long term complications of diabetes.  Outcome: Progressing Towards Goal  Goal: *Developing strategies to address psychosocial issues  Description  Describe feelings about living with diabetes; identify support needed and support network  Outcome: Progressing Towards Goal  Goal: *Insulin pump training  Outcome: Progressing Towards Goal   Goal: *Sick day guidelines  Outcome: Progressing Towards Goal  Goal: *Patient Specific Goal (EDIT GOAL, INSERT TEXT)  Outcome: Progressing Towards Goal     Problem: Patient Education: Go to Patient Education Activity  Goal: Patient/Family Education  Outcome: Progressing Towards Goal     Problem: Breathing Pattern - Ineffective  Goal: *Absence of hypoxia  Outcome: Progressing Towards Goal  Goal: *Use of effective breathing techniques  Outcome: Progressing Towards Goal     Problem: Falls - Risk of  Goal: *Absence of Falls  Description  Document Bridgette HabermannSchmid Fall Risk and appropriate interventions in the flowsheet.  Outcome: Progressing Towards Goal  Note:   Fall Risk Interventions:  Mobility Interventions: Patient to call before getting OOB         Medication Interventions: Patient to call before getting OOB    Elimination Interventions: Bed/chair exit alarm, Call light in reach              Problem: Patient Education: Go to Patient Education Activity  Goal: Patient/Family Education  Outcome: Progressing Towards Goal     Problem: Pressure Injury - Risk of  Goal: *Prevention of pressure injury  Description  Document Braden Scale and appropriate interventions in the flowsheet.  Outcome: Progressing Towards Goal  Note:   Pressure Injury Interventions:  Sensory Interventions: Assess changes in LOC    Moisture Interventions: Absorbent underpads    Activity Interventions: Pressure redistribution bed/mattress(bed type)    Mobility Interventions: Pressure redistribution bed/mattress (bed type)    Nutrition Interventions: Document food/fluid/supplement intake                     Problem: Patient Education: Go to Patient Education Activity  Goal: Patient/Family Education  Outcome: Progressing Towards Goal

## 2018-02-05 NOTE — Progress Notes (Signed)
Problem: Diabetes Self-Management  Goal: *Disease process and treatment process  Description  Define diabetes and identify own type of diabetes; list 3 options for treating diabetes.  Outcome: Progressing Towards Goal  Goal: *Incorporating nutritional management into lifestyle  Description  Describe effect of type, amount and timing of food on blood glucose; list 3 methods for planning meals.  Outcome: Progressing Towards Goal  Goal: *Incorporating physical activity into lifestyle  Description  State effect of exercise on blood glucose levels.  Outcome: Progressing Towards Goal  Goal: *Developing strategies to promote health/change behavior  Description  Define the ABC's of diabetes; identify appropriate screenings, schedule and personal plan for screenings.  Outcome: Progressing Towards Goal  Goal: *Using medications safely  Description  State effect of diabetes medications on diabetes; name diabetes medication taking, action and side effects.  Outcome: Progressing Towards Goal  Goal: *Monitoring blood glucose, interpreting and using results  Description  Identify recommended blood glucose targets  and personal targets.  Outcome: Progressing Towards Goal  Goal: *Prevention, detection, treatment of acute complications  Description  List symptoms of hyper- and hypoglycemia; describe how to treat low blood sugar and actions for lowering  high blood glucose level.  Outcome: Progressing Towards Goal  Goal: *Prevention, detection and treatment of chronic complications  Description  Define the natural course of diabetes and describe the relationship of blood glucose levels to long term complications of diabetes.  Outcome: Progressing Towards Goal  Goal: *Developing strategies to address psychosocial issues  Description  Describe feelings about living with diabetes; identify support needed and support network  Outcome: Progressing Towards Goal  Goal: *Insulin pump training  Outcome: Progressing Towards Goal   Goal: *Sick day guidelines  Outcome: Progressing Towards Goal  Goal: *Patient Specific Goal (EDIT GOAL, INSERT TEXT)  Outcome: Progressing Towards Goal

## 2018-02-05 NOTE — Other (Signed)
Bedside shift change report given to Rosanne RN (oncoming nurse) by Gina RN (offgoing nurse). Report included the following information SBAR, Kardex, ED Summary, Intake/Output, MAR and Recent Results.

## 2018-02-05 NOTE — Progress Notes (Signed)
Bedside shift change report given to GIna (oncoming nurse) by Mary (offgoing nurse). Report included the following information SBAR, Kardex and MAR.

## 2018-02-05 NOTE — Progress Notes (Signed)
Dr. Robin SearingMacGregor on unit. Notified of pt continues to have DBP in 100's.

## 2018-02-05 NOTE — Progress Notes (Signed)
Medicine Progress Note    Patient: [name] [name]   Age:  67 y.o.  DOA: 02/04/2018     LOS:  LOS: 1 day     Assessment/Plan   Anterior abdominal wall incarcerated fat hernia  Abdominal pain  Acute COPD exacerbation mild  Diabetes type 2 uncontrolled with hyperglycemia  Hypertension  GERD  Chronic pain syndrome  Hepatitis C    -Surgery evaluation for abdominal pain due to hernia, discussed with Dr. Marlowe Alt at this point no indication for immediate intervention, this would best done electively when patient recovers from COPD exacerbation and off all steroids  -Analgesia as needed  -Short course of prednisone  -Duo-neb RTC  -Albuterol Nebs PRN  -Supplemental O2 as needed  -Glucomander for blood sugar control  -Continue home meds for hypertension, add Lasix for elevated diastolic blood pressures        Subjective:   Patient seen and examined. No complaints, abdominal pain better controlled no N/V, F/C, CP or SOB     Objective:     Visit Vitals  BP (!) 139/112 (BP 1 Location: Left arm, BP Patient Position: Supine)   Pulse (!) 112   Temp 98.8 ??F (37.1 ??C)   Resp 20   SpO2 92%       Physical Exam:  General appearance: alert, cooperative, no distress, appears stated age  Head: Normocephalic, without obvious abnormality, atraumatic  Neck: supple, trachea midline  Lungs: Poor air movement throughout, no wheezing appreciated  Heart: regular rate and rhythm, S1, S2 normal, no murmur, click, rub or gallop  Abdomen: soft, firmness felt in abdomen just above the umbilicus non-tender. Bowel sounds normal.   no organomegaly  Extremities: extremities normal, atraumatic, no cyanosis or edema      Medications Reviewed:  Current Facility-Administered Medications   Medication Dose Route Frequency   ??? furosemide (LASIX) tablet 40 mg  40 mg Oral DAILY   ??? albuterol-ipratropium (DUO-NEB) 2.5 MG-0.5 MG/3 ML  3 mL Nebulization TID RT   ??? cloNIDine HCl (CATAPRES) tablet 0.3 mg  0.3 mg Oral BID   ??? dilTIAZem CD (CARDIZEM CD) capsule  180 mg  180 mg Oral DAILY   ??? fluticasone-vilanterol (BREO ELLIPTA) 142mcg-25mcg/puff  1 Puff Inhalation DAILY   ??? gabapentin (NEURONTIN) capsule 100 mg  100 mg Oral TID   ??? losartan (COZAAR) tablet 100 mg  100 mg Oral DAILY   ??? montelukast (SINGULAIR) tablet 10 mg  10 mg Oral QHS   ??? QUEtiapine SR (SEROquel XR) tablet 150 mg  150 mg Oral QHS   ??? traZODone (DESYREL) tablet 50 mg  50 mg Oral QHS PRN   ??? spironolactone (ALDACTONE) tablet 100 mg  100 mg Oral DAILY   ??? rosuvastatin (CRESTOR) tablet 5 mg  5 mg Oral QHS   ??? naloxone (NARCAN) injection 0.1 mg  0.1 mg IntraVENous PRN   ??? dextrose (D50) infusion 5-25 g  10-50 mL IntraVENous PRN   ??? glucagon (GLUCAGEN) injection 1 mg  1 mg IntraMUSCular PRN   ??? acetaminophen (TYLENOL) tablet 650 mg  650 mg Oral Q4H PRN    Or   ??? acetaminophen (TYLENOL) solution 650 mg  650 mg Oral Q4H PRN    Or   ??? acetaminophen (TYLENOL) suppository 650 mg  650 mg Rectal Q4H PRN   ??? morphine injection 2-4 mg  2-4 mg IntraVENous Q4H PRN   ??? oxyCODONE-acetaminophen (PERCOCET 10) 10-325 mg per tablet 1 Tab  1 Tab Oral Q4H PRN   ???  diphenhydrAMINE (BENADRYL) injection 25 mg  25 mg IntraVENous Q4H PRN   ??? ondansetron (ZOFRAN) injection 4 mg  4 mg IntraVENous Q4H PRN   ??? heparin (porcine) injection 5,000 Units  5,000 Units SubCUTAneous Q8H   ??? insulin glargine (LANTUS) injection 1-100 Units  1-100 Units SubCUTAneous QHS   ??? insulin lispro (HUMALOG) injection 1-100 Units  1-100 Units SubCUTAneous AC&HS   ??? insulin lispro (HUMALOG) injection 1-100 Units  1-100 Units SubCUTAneous PRN   ??? predniSONE (DELTASONE) tablet 40 mg  40 mg Oral DAILY WITH BREAKFAST   ??? albuterol (PROVENTIL VENTOLIN) nebulizer solution 2.5 mg  2.5 mg Nebulization Q4H PRN         Intake and Output:  Current Shift:  08/26 0701 - 08/26 1900  In: 1200 [P.O.:1200]  Out: 700 [Urine:700]  Last three shifts:  08/24 1901 - 08/26 0700  In: -   Out: 1100 [Urine:1100]    Lab/Data Reviewed:  All lab and imaging  results for the last 24 hours  reviewed.    Kristie Cowmanoy P Anahita Cua, MD  P# [phone]  February 05, 2018    [facility] Center[facility]agon medical dictation software was used for portions of this report.  Unintended voice transcription errors may have occurred.

## 2018-02-05 NOTE — Consults (Signed)
Consults  by Darral Dash, MD at 02/05/18 1100                Author: Darral Dash, MD  Service: General Surgery  Author Type: Physician       Filed: 02/05/18 2103  Date of Service: 02/05/18 1100  Status: Signed          Editor: Darral Dash, MD (Physician)                       [facility]   [phone]      CONSULT NOTE      Today's date: February 05, 2018   Patient: [name] [name]                  Sex: female         Date of Birth:  Sep 01, 1950      Age:  67 y.o.           DOA: 02/04/2018   LOS:  LOS: 1 day              Assessment/Plan        Ventral hernia. This contains only fat. This is her second visit and originally had an appt to see Dr Eulas Post tomorrow.   This is not a surgical emergency and would not recommend fixing her hernia at the time of a COPD exacerbationeven if mild  as it increases  the chances of a recurrence.  She has had this hernia for over ten years.  Also ideally it would be fixed with mesh; howeve,r her current steroid bolus increases her chances of a wound infection and poor healing.  Again this is not the ideal time to fix  a non-emergent ten year old hernia that only contains fat.  Would recommend elective repair when she is off steroids and stable from a respiratory viewpoint                  HPI:        [name] [name] is a 67 y.o.  female  Asked to see by ER Physicians.  She apparently presented last week with epigastric pain and found on ct to have a fat containing ventral hernia. She was discharged and was supposed to follow  up with Dr Eulas Post Tuesday.   Now complaining of increased pain and also shortness of breath.   Wbc normal.   Repeat CT again reveals epigastric hernia containing fat.   She denies other GI symptoms.   Only abdominal surgery is hysterectomy.   Ahe does  have COPD   Quit smoking but still used marijuana regularly and says she coughs after use.   Mild constipation   No lifting.          Past Medical History:        Diagnosis  Date          ?  Arthritis       ?  Chronic pain syndrome            related to R hip replacement         ?  COPD (chronic obstructive pulmonary disease) ([place])       ?  DM2 (diabetes mellitus, type 2) ([place])       ?  GERD (gastroesophageal reflux disease)       ?  Hepatitis C            HEP C         ?  HTN (hypertension)       ?  Lung nodule, multiple            (CT 10/2016) Small left upper lobe and 1.7 x 1.6 cm left lower lobe nodules not significantly changed from 07/23/2016 and 03/22/2016         ?  Marijuana use       ?  PTSD (post-traumatic stress disorder)            lived through the [place] in 1993         ?  Thoracic ascending aortic aneurysm ([place])            4.4cm noted on CT Chest (10/2016)         ?  Thyroid nodule            2.5 cm stable right thyroid nodule (CT 10/2016)             Past Surgical History:         Procedure  Laterality  Date          ?  HX ENDOSCOPY         ?  HX HIP REPLACEMENT  Right  01/29/2015     ?  HX HIP REPLACEMENT         ?  HX HYSTERECTOMY    2010          hysterectomy             Social History          Tobacco Use         ?  Smoking status:  Former Smoker     ?  Smokeless tobacco:  Never Used        ?  Tobacco comment: reported as quit smoking around 1990s per spouse       Substance Use Topics         ?  Alcohol use:  No                Prior to Admission medications             Medication  Sig  Start Date  End Date  Taking?  Authorizing Provider            oxyCODONE-acetaminophen (PERCOCET) 5-325 mg per tablet  Take 1 Tab by mouth every six (6) hours as needed for Pain.      Yes  Provider, Historical     nitrofurantoin, macrocrystal-monohydrate, (MACROBID) 100 mg capsule  Take 1 Cap by mouth two (2) times a day for 7 days.  02/01/18  02/08/18  Yes  Hendrick, Gaspar Bidding P, PA-C     insulin glargine (LANTUS SOLOSTAR U-100 INSULIN) 100 unit/mL (3 mL) inpn  32 Units by SubCUTAneous route nightly.  01/19/18    Yes  Wallene Huh, PA-C     metFORMIN ER (GLUCOPHAGE XR) 500 mg  tablet  Take 1 Tab by mouth two (2) times a day.  01/08/18    Yes  Brock, Monique, PA-C     gabapentin (NEURONTIN) 100 mg capsule  TAKE 1 CAPSULE BY MOUTH THREE TIMES DAILY  01/08/18    Yes  Wallene Huh, PA-C     dilTIAZem CD (CARDIZEM CD) 180 mg ER capsule  Take 1 Cap by mouth daily.  01/02/18    Yes  Fransisco Beau, Monique, PA-C     montelukast (SINGULAIR) 10 mg tablet  TAKE 1 TABLET BY MOUTH DAILY  01/02/18  Yes  Wallene Huh, PA-C     spironolactone (ALDACTONE) 100 mg tablet  TAKE 1 TABLET BY MOUTH DAILY  01/02/18    Yes  Fransisco Beau, Monique, PA-C     ondansetron (ZOFRAN ODT) 4 mg disintegrating tablet  Take 1 Tab by mouth every eight (8) hours as needed for Nausea. Indications: nausea  12/24/17    Yes  Hilton Sinclair, PA-C     albuterol (ACCUNEB) 1.25 mg/3 mL nebu  3 mL by Nebulization route every six (6) hours as needed (SOB, wheezing). Indications: Asthma Attack  12/07/17    Yes  Wallene Huh, PA-C     nabumetone (RELAFEN) 750 mg tablet  Take 750 mg by mouth two (2) times a day.      Yes  Provider, Historical     albuterol-ipratropium (DUO-NEB) 2.5 mg-0.5 mg/3 ml nebu  3 mL by Nebulization route.      Yes  Provider, Historical     tiotropium bromide (SPIRIVA RESPIMAT) 2.5 mcg/actuation inhaler  Take 1 Puff by inhalation daily.  10/11/17    Yes  Holladay, Lorra Hals, MD     fluticasone propion-salmeterol (ADVAIR DISKUS) 100-50 mcg/dose diskus inhaler  Take 1 Puff by inhalation two (2) times a day.  10/11/17    Yes  Holladay, Lorra Hals, MD     magnesium oxide (MAG-OX) 400 mg tablet  TAKE 1 TABLET BY MOUTH DAILY  08/14/17    Yes  Caryl Bis, FNP     traZODone (DESYREL) 50 mg tablet  Take 1 Tab by mouth nightly as needed for Sleep.  08/09/17    Yes  Einar Grad, MD     QUEtiapine SR (SEROQUEL XR) 150 mg sr tablet  TAKE 1 TABLET BY MOUTH NIGHTLY  07/13/17    Yes  Tharakan, Jolson K, MD     rosuvastatin (CRESTOR) 5 mg tablet  TAKE 1 TABLET BY MOUTH NIGHTLY  07/13/17    Yes  Tharakan, Jolson K, MD     polyethylene glycol  (MIRALAX) 17 gram packet  Take 1 Packet by mouth daily.  05/09/17    Yes  Lake Bells, MD     triamcinolone (NASACORT) 55 mcg nasal inhaler  2 Sprays by Both Nostrils route daily.  02/22/17    Yes  Fransisco Beau, Monique, PA-C     losartan (COZAAR) 100 mg tablet  TAKE 1 TABLET BY MOUTH DAILY FOR HYPERTENSION  02/05/18      Wallene Huh, PA-C     cloNIDine HCl (CATAPRES) 0.3 mg tablet  Take 1 Tab by mouth two (2) times a day. Take bid  02/05/18      Wallene Huh, PA-C     Insulin Needles, Disposable, (NANO PEN NEEDLE) 32 gauge x 5/32" ndle  To use with Lantus pen  01/03/18      Wallene Huh, PA-C     Blood Glucose Control High&Low (ACCU-CHEK AVIVA CONTROL SOLN) soln  Check blood sugar daily  11/23/17      Wallene Huh, PA-C     Blood-Glucose Meter (ACCU-CHEK AVIVA PLUS METER) misc  Check blood sugar daily  11/23/17      Wallene Huh, PA-C     glucose blood VI test strips (ACCU-CHEK AVIVA PLUS TEST STRP) strip  Check blood sugar daily  11/23/17      Wallene Huh, PA-C     alcohol swabs (BD SINGLE USE SWABS REGULAR) padm  Check blood sugar daily  11/23/17      Wallene Huh, PA-C  lancets (ACCU-CHEK SOFTCLIX LANCETS) misc  Check blood sugar daily  11/23/17      Wallene Huh, PA-C     Blood-Glucose Meter monitoring kit  Check sugars 2 times per day, fasting and 2 hours after dinner  07/15/15      Lake Bells, MD     Lancets misc  Check sugars twice daily  07/15/15      Lake Bells, MD            glucose blood VI test strips (ASCENSIA AUTODISC VI, ONE TOUCH ULTRA TEST VI) strip  Check sugars twice daily  07/15/15      [place]ake Bells, MD             Allergies        Allergen  Reactions         ?  Chocolate [Cocoa]  Sneezing        REVIEW OF SYSTEMS:   CONSTITUTIONAL:?? (-)fever  (-)fatigue.   EYES:?? (-)blindness    (-)blurry vision.   UPPER RESPIRATORY:?? (-)sore throat   (-)runny nose.   LUNGS:?? (+)shortness of breath     (-) wheezing.   CARDIAC:?? (-)chest pain     (-)palpitations.   GASTROINTESTINAL:??  (+)abdominal pain    (-)nause   (-) vomiting    (-)Hematemesis.??  (-) jaundice.?? ??   GENITOURINARY:?? (-)frequency   (-)dysuria.   NEUROLOGIC:??(-)headaches   (-)seizures   SKIN:?? (-)new skin lesions    (-)breast masses.         PHYSICAL EXAMINATION:   GENERAL:??well-developed, well-nourishedfemale in no apparent ??   distress.   MOST RECENT VITAL SIGNS:??   Visit Vitals      BP  (!) 139/112 (BP 1 Location: Left arm, BP Patient Position: Supine)     Pulse  96     Temp  99.1 ??F (37.3 ??C)     Resp  16        SpO2  100%              HEENT:?? sclerae are clear.?? There is no jaundice.   UPPER RESPIRATORY:?? There are  no obvious external nose or ear lesions.   NECK:?? Supple, with midline trachea.?? ??no masses   LYMPH?? NODES:?? Reveal no palpable cervical nodes.??no palpable groin ??   Nodes.   CV: no AAA   Good femoral pulses   ABDOMEN:?? Soft.??    no distention   no masses   no hepato-splenomegally   Epigastric hernia tender   pfannensteal incision               Darral Dash, MD   February 05, 2018   8:52 PM

## 2018-02-05 NOTE — Progress Notes (Signed)
Bedside shift change report given to GIna (oncoming nurse) by Corrie DandyMary (offgoing nurse). Report included the following information SBAR, Kardex and MAR.

## 2018-02-05 NOTE — Progress Notes (Signed)
Dr. Robin Searing paged re pt elevated BP. Await response.

## 2018-02-05 NOTE — Progress Notes (Signed)
Problem: Diabetes Self-Management  Goal: *Disease process and treatment process  Description  Define diabetes and identify own type of diabetes; list 3 options for treating diabetes.  Outcome: Progressing Towards Goal  Goal: *Incorporating nutritional management into lifestyle  Description  Describe effect of type, amount and timing of food on blood glucose; list 3 methods for planning meals.  Outcome: Progressing Towards Goal  Goal: *Incorporating physical activity into lifestyle  Description  State effect of exercise on blood glucose levels.  Outcome: Progressing Towards Goal  Goal: *Developing strategies to promote health/change behavior  Description  Define the ABC's of diabetes; identify appropriate screenings, schedule and personal plan for screenings.  Outcome: Progressing Towards Goal  Goal: *Using medications safely  Description  State effect of diabetes medications on diabetes; name diabetes medication taking, action and side effects.  Outcome: Progressing Towards Goal  Goal: *Monitoring blood glucose, interpreting and using results  Description  Identify recommended blood glucose targets  and personal targets.  Outcome: Progressing Towards Goal  Goal: *Prevention, detection, treatment of acute complications  Description  List symptoms of hyper- and hypoglycemia; describe how to treat low blood sugar and actions for lowering  high blood glucose level.  Outcome: Progressing Towards Goal  Goal: *Prevention, detection and treatment of chronic complications  Description  Define the natural course of diabetes and describe the relationship of blood glucose levels to long term complications of diabetes.  Outcome: Progressing Towards Goal  Goal: *Developing strategies to address psychosocial issues  Description  Describe feelings about living with diabetes; identify support needed and support network  Outcome: Progressing Towards Goal  Goal: *Insulin pump training  Outcome: Progressing Towards Goal  Goal: *Sick  day guidelines  Outcome: Progressing Towards Goal  Goal: *Patient Specific Goal (EDIT GOAL, INSERT TEXT)  Outcome: Progressing Towards Goal     Problem: Patient Education: Go to Patient Education Activity  Goal: Patient/Family Education  Outcome: Progressing Towards Goal     Problem: Breathing Pattern - Ineffective  Goal: *Absence of hypoxia  Outcome: Progressing Towards Goal  Goal: *Use of effective breathing techniques  Outcome: Progressing Towards Goal     Problem: Falls - Risk of  Goal: *Absence of Falls  Description  Document Bridgette HabermannSchmid Fall Risk and appropriate interventions in the flowsheet.  Outcome: Progressing Towards Goal  Note:   Fall Risk Interventions:  Mobility Interventions: Patient to call before getting OOB         Medication Interventions: Patient to call before getting OOB    Elimination Interventions: Bed/chair exit alarm, Call light in reach              Problem: Patient Education: Go to Patient Education Activity  Goal: Patient/Family Education  Outcome: Progressing Towards Goal     Problem: Pressure Injury - Risk of  Goal: *Prevention of pressure injury  Description  Document Braden Scale and appropriate interventions in the flowsheet.  Outcome: Progressing Towards Goal  Note:   Pressure Injury Interventions:  Sensory Interventions: Assess changes in LOC    Moisture Interventions: Absorbent underpads    Activity Interventions: Pressure redistribution bed/mattress(bed type)    Mobility Interventions: Pressure redistribution bed/mattress (bed type)    Nutrition Interventions: Document food/fluid/supplement intake                     Problem: Patient Education: Go to Patient Education Activity  Goal: Patient/Family Education  Outcome: Progressing Towards Goal

## 2018-02-05 NOTE — Progress Notes (Signed)
Problem: Diabetes Self-Management  Goal: *Disease process and treatment process  Description  Define diabetes and identify own type of diabetes; list 3 options for treating diabetes.  Outcome: Progressing Towards Goal  Goal: *Incorporating nutritional management into lifestyle  Description  Describe effect of type, amount and timing of food on blood glucose; list 3 methods for planning meals.  Outcome: Progressing Towards Goal  Goal: *Incorporating physical activity into lifestyle  Description  State effect of exercise on blood glucose levels.  Outcome: Progressing Towards Goal  Goal: *Developing strategies to promote health/change behavior  Description  Define the ABC's of diabetes; identify appropriate screenings, schedule and personal plan for screenings.  Outcome: Progressing Towards Goal  Goal: *Using medications safely  Description  State effect of diabetes medications on diabetes; name diabetes medication taking, action and side effects.  Outcome: Progressing Towards Goal  Goal: *Monitoring blood glucose, interpreting and using results  Description  Identify recommended blood glucose targets  and personal targets.  Outcome: Progressing Towards Goal  Goal: *Prevention, detection, treatment of acute complications  Description  List symptoms of hyper- and hypoglycemia; describe how to treat low blood sugar and actions for lowering  high blood glucose level.  Outcome: Progressing Towards Goal  Goal: *Prevention, detection and treatment of chronic complications  Description  Define the natural course of diabetes and describe the relationship of blood glucose levels to long term complications of diabetes.  Outcome: Progressing Towards Goal  Goal: *Developing strategies to address psychosocial issues  Description  Describe feelings about living with diabetes; identify support needed and support network  Outcome: Progressing Towards Goal  Goal: *Insulin pump training  Outcome: Progressing Towards Goal  Goal: *Sick  day guidelines  Outcome: Progressing Towards Goal  Goal: *Patient Specific Goal (EDIT GOAL, INSERT TEXT)  Outcome: Progressing Towards Goal

## 2018-02-06 LAB — POCT GLUCOSE
POC Glucose: 113 mg/dL — ABNORMAL HIGH (ref 65–105)
POC Glucose: 110 mg/dL — ABNORMAL HIGH (ref 65–105)
POC Glucose: 138 mg/dL — ABNORMAL HIGH (ref 65–105)
POC Glucose: 148 mg/dL — ABNORMAL HIGH (ref 65–105)

## 2018-02-06 LAB — GLUCOSE, POC
Glucose (POC): 113 mg/dL — ABNORMAL HIGH (ref 65–105)
Glucose (POC): 110 mg/dL — ABNORMAL HIGH (ref 65–105)
Glucose (POC): 138 mg/dL — ABNORMAL HIGH (ref 65–105)
Glucose (POC): 148 mg/dL — ABNORMAL HIGH (ref 65–105)

## 2018-02-06 MED ORDER — PREDNISONE 20 MG TAB
20 mg | Freq: Two times a day (BID) | ORAL | Status: DC
Start: 2018-02-06 — End: 2018-02-08
  Administered 2018-02-06 – 2018-02-08 (×4): via ORAL

## 2018-02-06 MED FILL — IPRATROPIUM-ALBUTEROL 2.5 MG-0.5 MG/3 ML NEB SOLUTION: 2.5 mg-0.5 mg/3 ml | RESPIRATORY_TRACT | Qty: 3

## 2018-02-06 MED FILL — OXYCODONE-ACETAMINOPHEN 10 MG-325 MG TAB: 10-325 mg | ORAL | Qty: 1

## 2018-02-06 MED FILL — MORPHINE 2 MG/ML INJECTION: 2 mg/mL | INTRAMUSCULAR | Qty: 2

## 2018-02-06 MED FILL — TRAZODONE 50 MG TAB: 50 mg | ORAL | Qty: 1

## 2018-02-06 MED FILL — GABAPENTIN 100 MG CAP: 100 mg | ORAL | Qty: 1

## 2018-02-06 MED FILL — HEPARIN (PORCINE) 5,000 UNIT/ML IJ SOLN: 5000 unit/mL | INTRAMUSCULAR | Qty: 1

## 2018-02-06 MED FILL — DIPHENHYDRAMINE HCL 50 MG/ML IJ SOLN: 50 mg/mL | INTRAMUSCULAR | Qty: 1

## 2018-02-06 MED FILL — QUETIAPINE SR 50 MG 24 HR TAB: 50 mg | ORAL | Qty: 3

## 2018-02-06 MED FILL — MONTELUKAST 10 MG TAB: 10 mg | ORAL | Qty: 1

## 2018-02-06 MED FILL — CLONIDINE 0.1 MG TAB: 0.1 mg | ORAL | Qty: 3

## 2018-02-06 MED FILL — INSULIN GLARGINE 100 UNIT/ML INJECTION: 100 unit/mL | SUBCUTANEOUS | Qty: 1

## 2018-02-06 MED FILL — ROSUVASTATIN 5 MG TAB: 5 mg | ORAL | Qty: 1

## 2018-02-06 MED FILL — SPIRONOLACTONE 100 MG TAB: 100 mg | ORAL | Qty: 1

## 2018-02-06 MED FILL — PREDNISONE 20 MG TAB: 20 mg | ORAL | Qty: 2

## 2018-02-06 MED FILL — DILTIAZEM ER 180 MG 24 HR CAP: 180 mg | ORAL | Qty: 1

## 2018-02-06 MED FILL — FUROSEMIDE 40 MG TAB: 40 mg | ORAL | Qty: 1

## 2018-02-06 MED FILL — LOSARTAN 50 MG TAB: 50 mg | ORAL | Qty: 2

## 2018-02-06 MED FILL — INSULIN LISPRO 100 UNIT/ML INJECTION: 100 unit/mL | SUBCUTANEOUS | Qty: 1

## 2018-02-06 NOTE — Progress Notes (Signed)
Bedside shift change report given to Gina (oncoming nurse) by Roseanne (offgoing nurse). Report included the following information SBAR, Kardex and MAR.

## 2018-02-06 NOTE — Other (Signed)
Bedside and Verbal shift change report given to Juana, RN (oncoming nurse) by Gina, RN (offgoing nurse). Report included the following information SBAR, Kardex, Intake/Output and MAR.

## 2018-02-06 NOTE — Progress Notes (Signed)
[facility]   Chief Complaint   Patient presents with   ??? Shortness of Breath   ??? Abdominal Pain        The patient has been admitted to the hospital  3  times in the past 12 months.    Previous 4 Admission Dates Admission and Discharge Diagnosis Interventions Barriers Disposition   6/4 to 6/8 / 19  COPD      [facility]   [facility]   10/08/2017 - 10/11/2017   SIRS    [facility] with [facility]    [facility]   09/08/2017 - 09/14/2017   Hyperkalemia  Obtunded  ARF   Septic Shock   Metabolic encephalopathy   Home with [facility]  [facility]              Tentative dc plan:  Home with [facility]-  Emailed Referral      Facility if plan   na    Anticipated Discharge Date: tbd    Spouse   Would you like any one involved in your discharge plan ? Spouse     PCP: [name], PA-C     Specialists:    na    Face sheet information, address, contact info and insurance verified   yes    Dialysis Unit/ chair time / access:   na    Pharmacy:     [facility]   Any issues getting medications   none    DME at home and provider:    Nebulizer  Walker  Cane    O2 at home    na  Home Environment:    Lives at [address]  [place] [address]    @[email or web address] @.     Prior to admission open services:   none    Extended Emergency Contact Information  Primary Emergency Contact: Goodwin,Bobby  Address: [address]           [place], [place] [address] [place]  Home Phone: [phone]  Mobile Phone: [phone]  Relation: Spouse      Transportation:    Spouse   will transport home    Therapy Recommendations:  OT :y/n    n  PT :y/n     n  SLP :y/n   n    RT Home O2 Evaluation :y/n    n    Wound Care: y/n     n    Change consult (formerly [facility]) :na    Case Management Assessment    ABUSE/NEGLECT SCREENING   Physical Abuse/Neglect: Denies   Sexual Abuse: Denies   Sexual Abuse: Denies   Other Abuse/Issues: Denies          PRIMARY DECISION MAKER                                   CARE MANAGEMENT INTERVENTIONS    Readmission Interview Completed: Not Applicable   PCP Verified by CM: Yes(PA  Mal Amabile )   Last Visit to PCP: (couple of weeks ago )       Mode of Transport at Discharge: Other (see comment)(spouse )       Transition of Care Consult (CM Consult): Home Health           MyChart Signup: No   Discharge Durable Medical Equipment: No   Physical Therapy Consult: No   Occupational Therapy Consult: No  Speech Therapy Consult: No   Current Support Network: Lives with Spouse   Reason for Referral: DCP Rounds   History Provided By: Patient, Spouse, Medical Record   Patient Orientation: Alert and Oriented, Person, Place, Situation, Self   Cognition: Alert   Support System Response: Concerned, Cooperative   Previous Living Arrangement: Lives with Family Independent   Home Accessibility: Steps(2 steps into one story )   Prior Functional Level: Independent in ADLs/IADLs   Current Functional Level: Independent in ADLs/IADLs   Retired Tour manageread Only-Primary Language: English   Can patient return to prior living arrangement: Yes   Ability to make needs known:: Good   Family able to assist with home care needs:: Yes   Pets: None           Types of Needs Identified: Disease Management Education, Treatment Education, Emotional Support       Confirm Follow Up Transport: Family   Confirm Transport and Arrange: Yes   Plan discussed with Pt/Family/Caregiver: Yes   Freedom of Choice Offered: Yes      DISCHARGE LOCATION   Discharge Placement: Home with home health

## 2018-02-06 NOTE — Progress Notes (Signed)
Problem: Diabetes Self-Management  Goal: *Disease process and treatment process  Description  Define diabetes and identify own type of diabetes; list 3 options for treating diabetes.  Outcome: Progressing Towards Goal  Goal: *Incorporating nutritional management into lifestyle  Description  Describe effect of type, amount and timing of food on blood glucose; list 3 methods for planning meals.  Outcome: Progressing Towards Goal  Goal: *Incorporating physical activity into lifestyle  Description  State effect of exercise on blood glucose levels.  Outcome: Progressing Towards Goal  Goal: *Developing strategies to promote health/change behavior  Description  Define the ABC's of diabetes; identify appropriate screenings, schedule and personal plan for screenings.  Outcome: Progressing Towards Goal  Goal: *Using medications safely  Description  State effect of diabetes medications on diabetes; name diabetes medication taking, action and side effects.  Outcome: Progressing Towards Goal  Goal: *Monitoring blood glucose, interpreting and using results  Description  Identify recommended blood glucose targets  and personal targets.  Outcome: Progressing Towards Goal  Goal: *Prevention, detection, treatment of acute complications  Description  List symptoms of hyper- and hypoglycemia; describe how to treat low blood sugar and actions for lowering  high blood glucose level.  Outcome: Progressing Towards Goal  Goal: *Prevention, detection and treatment of chronic complications  Description  Define the natural course of diabetes and describe the relationship of blood glucose levels to long term complications of diabetes.  Outcome: Progressing Towards Goal  Goal: *Developing strategies to address psychosocial issues  Description  Describe feelings about living with diabetes; identify support needed and support network  Outcome: Progressing Towards Goal  Goal: *Insulin pump training  Outcome: Progressing Towards Goal   Goal: *Sick day guidelines  Outcome: Progressing Towards Goal  Goal: *Patient Specific Goal (EDIT GOAL, INSERT TEXT)  Outcome: Progressing Towards Goal

## 2018-02-06 NOTE — Progress Notes (Signed)
Medicine Progress Note    Patient: [name] [name]   Age:  67 y.o.  DOA: 02/04/2018     LOS:  LOS: 2 days     Assessment/Plan   Anterior abdominal wall incarcerated fat hernia  Abdominal pain  Acute COPD exacerbation mild  Diabetes type 2 uncontrolled with hyperglycemia  Hypertension  GERD  Chronic pain syndrome  Hepatitis C    -Surgery evaluation for abdominal pain due to hernia, discussed with Dr. Marlowe AltAlireza Farpour at this point no indication for immediate intervention, this would best done electively when patient recovers from COPD exacerbation and off all steroids  -Analgesia as needed  -Continue  prednisone  -Duo-neb RTC  -Albuterol Nebs PRN  -Supplemental O2 as needed  -Glucomander for blood sugar control  -Continue home meds for hypertension with the addition of Lasix        Subjective:   Patient seen and examined. No complaints, abdominal pain better controlled on pain meds no N/V, F/C, CP or SOB     Objective:     Visit Vitals  BP (!) 127/95 (BP 1 Location: Left arm, BP Patient Position: Supine)   Pulse 95   Temp 98.9 ??F (37.2 ??C)   Resp 18   SpO2 95%       Physical Exam:  General appearance: alert, cooperative, no distress, appears stated age  Head: Normocephalic, without obvious abnormality, atraumatic  Neck: supple, trachea midline  Lungs: Poor air movement throughout, no wheezing appreciated  Heart: regular rate and rhythm, S1, S2 normal, no murmur, click, rub or gallop  Abdomen: soft, firmness felt in abdomen just above the umbilicus non-tender. Bowel sounds normal.   no organomegaly  Extremities: extremities normal, atraumatic, no cyanosis or edema      Medications Reviewed:  Current Facility-Administered Medications   Medication Dose Route Frequency   ??? predniSONE (DELTASONE) tablet 40 mg  40 mg Oral BID WITH MEALS   ??? furosemide (LASIX) tablet 40 mg  40 mg Oral DAILY   ??? albuterol-ipratropium (DUO-NEB) 2.5 MG-0.5 MG/3 ML  3 mL Nebulization TID RT    ??? cloNIDine HCl (CATAPRES) tablet 0.3 mg  0.3 mg Oral BID   ??? dilTIAZem CD (CARDIZEM CD) capsule 180 mg  180 mg Oral DAILY   ??? fluticasone-vilanterol (BREO ELLIPTA) 17800mcg-25mcg/puff  1 Puff Inhalation DAILY   ??? gabapentin (NEURONTIN) capsule 100 mg  100 mg Oral TID   ??? losartan (COZAAR) tablet 100 mg  100 mg Oral DAILY   ??? montelukast (SINGULAIR) tablet 10 mg  10 mg Oral QHS   ??? QUEtiapine SR (SEROquel XR) tablet 150 mg  150 mg Oral QHS   ??? traZODone (DESYREL) tablet 50 mg  50 mg Oral QHS PRN   ??? spironolactone (ALDACTONE) tablet 100 mg  100 mg Oral DAILY   ??? rosuvastatin (CRESTOR) tablet 5 mg  5 mg Oral QHS   ??? naloxone (NARCAN) injection 0.1 mg  0.1 mg IntraVENous PRN   ??? dextrose (D50) infusion 5-25 g  10-50 mL IntraVENous PRN   ??? glucagon (GLUCAGEN) injection 1 mg  1 mg IntraMUSCular PRN   ??? acetaminophen (TYLENOL) tablet 650 mg  650 mg Oral Q4H PRN    Or   ??? acetaminophen (TYLENOL) solution 650 mg  650 mg Oral Q4H PRN    Or   ??? acetaminophen (TYLENOL) suppository 650 mg  650 mg Rectal Q4H PRN   ??? morphine injection 2-4 mg  2-4 mg IntraVENous Q4H PRN   ??? oxyCODONE-acetaminophen (PERCOCET  10) 10-325 mg per tablet 1 Tab  1 Tab Oral Q4H PRN   ??? diphenhydrAMINE (BENADRYL) injection 25 mg  25 mg IntraVENous Q4H PRN   ??? ondansetron (ZOFRAN) injection 4 mg  4 mg IntraVENous Q4H PRN   ??? heparin (porcine) injection 5,000 Units  5,000 Units SubCUTAneous Q8H   ??? insulin glargine (LANTUS) injection 1-100 Units  1-100 Units SubCUTAneous QHS   ??? insulin lispro (HUMALOG) injection 1-100 Units  1-100 Units SubCUTAneous AC&HS   ??? insulin lispro (HUMALOG) injection 1-100 Units  1-100 Units SubCUTAneous PRN   ??? albuterol (PROVENTIL VENTOLIN) nebulizer solution 2.5 mg  2.5 mg Nebulization Q4H PRN         Intake and Output:  Current Shift:  08/27 0701 - 08/27 1900  In: 480 [P.O.:480]  Out: -   Last three shifts:  08/25 1901 - 08/27 0700  In: 1300 [P.O.:1300]  Out: 4300 [Urine:4300]    Lab/Data Reviewed:   All lab and imaging  results for the last 24 hours reviewed.    Kristie Cowman, MD  P# [phone]  February 06, 2018    [facility] medical dictation software was used for portions of this report.  Unintended voice transcription errors may have occurred.

## 2018-02-06 NOTE — Progress Notes (Signed)
Problem: Diabetes Self-Management  Goal: *Disease process and treatment process  Description  Define diabetes and identify own type of diabetes; list 3 options for treating diabetes.  Outcome: Progressing Towards Goal  Goal: *Incorporating nutritional management into lifestyle  Description  Describe effect of type, amount and timing of food on blood glucose; list 3 methods for planning meals.  Outcome: Progressing Towards Goal  Goal: *Incorporating physical activity into lifestyle  Description  State effect of exercise on blood glucose levels.  Outcome: Progressing Towards Goal  Goal: *Developing strategies to promote health/change behavior  Description  Define the ABC's of diabetes; identify appropriate screenings, schedule and personal plan for screenings.  Outcome: Progressing Towards Goal  Goal: *Using medications safely  Description  State effect of diabetes medications on diabetes; name diabetes medication taking, action and side effects.  Outcome: Progressing Towards Goal  Goal: *Monitoring blood glucose, interpreting and using results  Description  Identify recommended blood glucose targets  and personal targets.  Outcome: Progressing Towards Goal  Goal: *Prevention, detection, treatment of acute complications  Description  List symptoms of hyper- and hypoglycemia; describe how to treat low blood sugar and actions for lowering  high blood glucose level.  Outcome: Progressing Towards Goal  Goal: *Prevention, detection and treatment of chronic complications  Description  Define the natural course of diabetes and describe the relationship of blood glucose levels to long term complications of diabetes.  Outcome: Progressing Towards Goal  Goal: *Developing strategies to address psychosocial issues  Description  Describe feelings about living with diabetes; identify support needed and support network  Outcome: Progressing Towards Goal  Goal: *Insulin pump training  Outcome: Progressing Towards Goal   Goal: *Sick day guidelines  Outcome: Progressing Towards Goal  Goal: *Patient Specific Goal (EDIT GOAL, INSERT TEXT)  Outcome: Progressing Towards Goal     Problem: Patient Education: Go to Patient Education Activity  Goal: Patient/Family Education  Outcome: Progressing Towards Goal     Problem: Breathing Pattern - Ineffective  Goal: *Absence of hypoxia  Outcome: Progressing Towards Goal  Goal: *Use of effective breathing techniques  Outcome: Progressing Towards Goal     Problem: Falls - Risk of  Goal: *Absence of Falls  Description  Document [name] [name] Fall Risk and appropriate interventions in the flowsheet.  Outcome: Progressing Towards Goal  Note:   Fall Risk Interventions:  Mobility Interventions: Bed/chair exit alarm         Medication Interventions: Patient to call before getting OOB    Elimination Interventions: Call light in reach              Problem: Patient Education: Go to Patient Education Activity  Goal: Patient/Family Education  Outcome: Progressing Towards Goal     Problem: Pressure Injury - Risk of  Goal: *Prevention of pressure injury  Description  Document Braden Scale and appropriate interventions in the flowsheet.  Outcome: Progressing Towards Goal  Note:   Pressure Injury Interventions:  Sensory Interventions: Assess changes in LOC    Moisture Interventions: Absorbent underpads    Activity Interventions: Increase time out of bed    Mobility Interventions: Pressure redistribution bed/mattress (bed type)    Nutrition Interventions: Document food/fluid/supplement intake                     Problem: Patient Education: Go to Patient Education Activity  Goal: Patient/Family Education  Outcome: Progressing Towards Goal

## 2018-02-06 NOTE — Progress Notes (Signed)
Medicine Progress Note    Patient: [name] [name]   Age:  67 y.o.  DOA: 02/04/2018     LOS:  LOS: 2 days     Assessment/Plan   Anterior abdominal wall incarcerated fat hernia  Abdominal pain  Acute COPD exacerbation mild  Diabetes type 2 uncontrolled with hyperglycemia  Hypertension  GERD  Chronic pain syndrome  Hepatitis C    -Surgery evaluation for abdominal pain due to hernia, discussed with Dr. Marlowe Alt at this point no indication for immediate intervention, this would best done electively when patient recovers from COPD exacerbation and off all steroids  -Analgesia as needed  -Continue  prednisone  -Duo-neb RTC  -Albuterol Nebs PRN  -Supplemental O2 as needed  -[place] for blood sugar control  -Continue home meds for hypertension with the addition of Lasix        Subjective:   Patient seen and examined. No complaints, abdominal pain better controlled on pain meds no N/V, F/C, CP or SOB     Objective:     Visit Vitals  BP (!) 127/95 (BP 1 Location: Left arm, BP Patient Position: Supine)   Pulse 95   Temp 98.9 ??F (37.2 ??C)   Resp 18   SpO2 95%       Physical Exam:  General appearance: alert, cooperative, no distress, appears stated age  Head: Normocephalic, without obvious abnormality, atraumatic  Neck: supple, trachea midline  Lungs: Poor air movement throughout, no wheezing appreciated  Heart: regular rate and rhythm, S1, S2 normal, no murmur, click, rub or gallop  Abdomen: soft, firmness felt in abdomen just above the umbilicus non-tender. Bowel sounds normal.   no organomegaly  Extremities: extremities normal, atraumatic, no cyanosis or edema      Medications Reviewed:  Current Facility-Administered Medications   Medication Dose Route Frequency   ??? predniSONE (DELTASONE) tablet 40 mg  40 mg Oral BID WITH MEALS   ??? furosemide (LASIX) tablet 40 mg  40 mg Oral DAILY   ??? albuterol-ipratropium (DUO-NEB) 2.5 MG-0.5 MG/3 ML  3 mL Nebulization TID RT   ??? cloNIDine HCl (CATAPRES) tablet 0.3 mg  0.3 mg Oral  BID   ??? dilTIAZem CD (CARDIZEM CD) capsule 180 mg  180 mg Oral DAILY   ??? fluticasone-vilanterol (BREO ELLIPTA) 152mcg-25mcg/puff  1 Puff Inhalation DAILY   ??? gabapentin (NEURONTIN) capsule 100 mg  100 mg Oral TID   ??? losartan (COZAAR) tablet 100 mg  100 mg Oral DAILY   ??? montelukast (SINGULAIR) tablet 10 mg  10 mg Oral QHS   ??? QUEtiapine SR (SEROquel XR) tablet 150 mg  150 mg Oral QHS   ??? traZODone (DESYREL) tablet 50 mg  50 mg Oral QHS PRN   ??? spironolactone (ALDACTONE) tablet 100 mg  100 mg Oral DAILY   ??? rosuvastatin (CRESTOR) tablet 5 mg  5 mg Oral QHS   ??? naloxone (NARCAN) injection 0.1 mg  0.1 mg IntraVENous PRN   ??? dextrose (D50) infusion 5-25 g  10-50 mL IntraVENous PRN   ??? glucagon (GLUCAGEN) injection 1 mg  1 mg IntraMUSCular PRN   ??? acetaminophen (TYLENOL) tablet 650 mg  650 mg Oral Q4H PRN    Or   ??? acetaminophen (TYLENOL) solution 650 mg  650 mg Oral Q4H PRN    Or   ??? acetaminophen (TYLENOL) suppository 650 mg  650 mg Rectal Q4H PRN   ??? morphine injection 2-4 mg  2-4 mg IntraVENous Q4H PRN   ??? oxyCODONE-acetaminophen (PERCOCET  10) 10-325 mg per tablet 1 Tab  1 Tab Oral Q4H PRN   ??? diphenhydrAMINE (BENADRYL) injection 25 mg  25 mg IntraVENous Q4H PRN   ??? ondansetron (ZOFRAN) injection 4 mg  4 mg IntraVENous Q4H PRN   ??? heparin (porcine) injection 5,000 Units  5,000 Units SubCUTAneous Q8H   ??? insulin glargine (LANTUS) injection 1-100 Units  1-100 Units SubCUTAneous QHS   ??? insulin lispro (HUMALOG) injection 1-100 Units  1-100 Units SubCUTAneous AC&HS   ??? insulin lispro (HUMALOG) injection 1-100 Units  1-100 Units SubCUTAneous PRN   ??? albuterol (PROVENTIL VENTOLIN) nebulizer solution 2.5 mg  2.5 mg Nebulization Q4H PRN         Intake and Output:  Current Shift:  08/27 0701 - 08/27 1900  In: 480 [P.O.:480]  Out: -   Last three shifts:  08/25 1901 - 08/27 0700  In: 1300 [P.O.:1300]  Out: 4300 [Urine:4300]    Lab/Data Reviewed:  All lab and imaging  results for the last 24 hours reviewed.    Kristie Cowmanoy P Anahlia Iseminger,  MD  P# [phone]  February 06, 2018    [facility] Hospital[facility]agon medical dictation software was used for portions of this report.  Unintended voice transcription errors may have occurred.

## 2018-02-06 NOTE — Progress Notes (Signed)
Problem: Diabetes Self-Management  Goal: *Disease process and treatment process  Description: Define diabetes and identify own type of diabetes; list 3 options for treating diabetes.  Outcome: Progressing Towards Goal  Goal: *Incorporating nutritional management into lifestyle  Description: Describe effect of type, amount and timing of food on blood glucose; list 3 methods for planning meals.  Outcome: Progressing Towards Goal  Goal: *Incorporating physical activity into lifestyle  Description: State effect of exercise on blood glucose levels.  Outcome: Progressing Towards Goal  Goal: *Developing strategies to promote health/change behavior  Description: Define the ABC's of diabetes; identify appropriate screenings, schedule and personal plan for screenings.  Outcome: Progressing Towards Goal  Goal: *Using medications safely  Description: State effect of diabetes medications on diabetes; name diabetes medication taking, action and side effects.  Outcome: Progressing Towards Goal  Goal: *Monitoring blood glucose, interpreting and using results  Description: Identify recommended blood glucose targets  and personal targets.  Outcome: Progressing Towards Goal  Goal: *Prevention, detection, treatment of acute complications  Description: List symptoms of hyper- and hypoglycemia; describe how to treat low blood sugar and actions for lowering  high blood glucose level.  Outcome: Progressing Towards Goal  Goal: *Prevention, detection and treatment of chronic complications  Description: Define the natural course of diabetes and describe the relationship of blood glucose levels to long term complications of diabetes.  Outcome: Progressing Towards Goal  Goal: *Developing strategies to address psychosocial issues  Description: Describe feelings about living with diabetes; identify support needed and support network  Outcome: Progressing Towards Goal  Goal: *Insulin pump training  Outcome: Progressing Towards Goal  Goal: *Sick  day guidelines  Outcome: Progressing Towards Goal  Goal: *Patient Specific Goal (EDIT GOAL, INSERT TEXT)  Outcome: Progressing Towards Goal

## 2018-02-06 NOTE — Progress Notes (Signed)
Problem: Diabetes Self-Management  Goal: *Disease process and treatment process  Description  Define diabetes and identify own type of diabetes; list 3 options for treating diabetes.  Outcome: Progressing Towards Goal  Goal: *Incorporating nutritional management into lifestyle  Description  Describe effect of type, amount and timing of food on blood glucose; list 3 methods for planning meals.  Outcome: Progressing Towards Goal  Goal: *Incorporating physical activity into lifestyle  Description  State effect of exercise on blood glucose levels.  Outcome: Progressing Towards Goal  Goal: *Developing strategies to promote health/change behavior  Description  Define the ABC's of diabetes; identify appropriate screenings, schedule and personal plan for screenings.  Outcome: Progressing Towards Goal  Goal: *Using medications safely  Description  State effect of diabetes medications on diabetes; name diabetes medication taking, action and side effects.  Outcome: Progressing Towards Goal  Goal: *Monitoring blood glucose, interpreting and using results  Description  Identify recommended blood glucose targets  and personal targets.  Outcome: Progressing Towards Goal  Goal: *Prevention, detection, treatment of acute complications  Description  List symptoms of hyper- and hypoglycemia; describe how to treat low blood sugar and actions for lowering  high blood glucose level.  Outcome: Progressing Towards Goal  Goal: *Prevention, detection and treatment of chronic complications  Description  Define the natural course of diabetes and describe the relationship of blood glucose levels to long term complications of diabetes.  Outcome: Progressing Towards Goal  Goal: *Developing strategies to address psychosocial issues  Description  Describe feelings about living with diabetes; identify support needed and support network  Outcome: Progressing Towards Goal  Goal: *Insulin pump training  Outcome: Progressing Towards Goal  Goal: *Sick  day guidelines  Outcome: Progressing Towards Goal  Goal: *Patient Specific Goal (EDIT GOAL, INSERT TEXT)  Outcome: Progressing Towards Goal     Problem: Patient Education: Go to Patient Education Activity  Goal: Patient/Family Education  Outcome: Progressing Towards Goal     Problem: Breathing Pattern - Ineffective  Goal: *Absence of hypoxia  Outcome: Progressing Towards Goal  Goal: *Use of effective breathing techniques  Outcome: Progressing Towards Goal     Problem: Falls - Risk of  Goal: *Absence of Falls  Description  Document Bridgette HabermannSchmid Fall Risk and appropriate interventions in the flowsheet.  Outcome: Progressing Towards Goal  Note:   Fall Risk Interventions:  Mobility Interventions: Bed/chair exit alarm         Medication Interventions: Patient to call before getting OOB    Elimination Interventions: Call light in reach              Problem: Patient Education: Go to Patient Education Activity  Goal: Patient/Family Education  Outcome: Progressing Towards Goal     Problem: Pressure Injury - Risk of  Goal: *Prevention of pressure injury  Description  Document Braden Scale and appropriate interventions in the flowsheet.  Outcome: Progressing Towards Goal  Note:   Pressure Injury Interventions:  Sensory Interventions: Assess changes in LOC    Moisture Interventions: Absorbent underpads    Activity Interventions: Increase time out of bed    Mobility Interventions: Pressure redistribution bed/mattress (bed type)    Nutrition Interventions: Document food/fluid/supplement intake                     Problem: Patient Education: Go to Patient Education Activity  Goal: Patient/Family Education  Outcome: Progressing Towards Goal

## 2018-02-06 NOTE — Progress Notes (Signed)
 Patient admitted on 02/04/2018 from   [facility]    with   Chief Complaint   Patient presents with   . Shortness of Breath   . Abdominal Pain        The patient has been admitted to the hospital  3  times in the past 12 months.    Previous 4 Admission Dates Admission and Discharge Diagnosis Interventions Barriers Disposition   6/4 to 6/8 / 19  COPD      Home with [facility]   [facility]   10/08/2017 - 10/11/2017   SIRS    [facility] with [facility]    [facility]   09/08/2017 - 09/14/2017   Hyperkalemia  Obtunded  ARF   Septic Shock   Metabolic encephalopathy   Home with [facility]  [facility]              Tentative dc plan:  Home with [facility]  Emailed Referral      Facility if plan   na    Anticipated Discharge Date: tbd    Spouse   Would you like any one involved in your discharge plan ? Spouse     PCP: Lucious Gun, PA-C     Specialists:    na    Face sheet information, address, contact info and insurance verified   yes    Dialysis Unit/ chair time / access:   na    Pharmacy:     [facility]   Any issues getting medications   none    DME at home and provider:    Nebulizer  [facility]ane    O2 at home    na  Home Environment:    Lives at [address]  [place] [address]    @HOMEPHONE @.     Prior to admission open services:   none    Extended Emergency Contact Information  Primary Emergency Contact: Goodwin,Bobby  Address: [address]           [place], [place] [address] [place]  Home Phone: [phone]  Mobile Phone: [phone]  Relation: Spouse      Transportation:    Spouse   will transport home    Therapy Recommendations:  OT :y/n    n  PT :y/n     n  SLP :y/n   n    RT Home O2 Evaluation :y/n    n    Wound Care: y/n     n    Change consult (formerly [facility]) :na    Case Management Assessment    ABUSE/NEGLECT SCREENING   Physical Abuse/Neglect: Denies   Sexual Abuse: Denies   Sexual Abuse: Denies   Other Abuse/Issues: Denies          PRIMARY DECISION MAKER                                   CARE MANAGEMENT INTERVENTIONS   Readmission Interview  Completed: Not Applicable   PCP Verified by CM: Yes(PA  Lucious )   Last Visit to PCP: (couple of weeks ago )       Mode of Transport at Discharge: Other (see comment)(spouse )       Transition of Care Consult (CM Consult): Home Health           MyChart Signup: No   Discharge Durable Medical Equipment: No   Physical Therapy Consult: No   Occupational Therapy Consult: No  Speech Therapy Consult: No   Current Support Network: Lives with Spouse   Reason for Referral: DCP Rounds   History Provided By: Patient, Spouse, Medical Record   Patient Orientation: Alert and Oriented, Person, Place, Situation, Self   Cognition: Alert   Support System Response: Concerned, Cooperative   Previous Living Arrangement: Lives with Family Independent   Home Accessibility: Steps(2 steps into one story )   Prior Functional Level: Independent in ADLs/IADLs   Current Functional Level: Independent in ADLs/IADLs   Retired Tour manager Language: English   Can patient return to prior living arrangement: Yes   Ability to make needs known:: Good   Family able to assist with home care needs:: Yes   Pets: None           Types of Needs Identified: Disease Management Education, Treatment Education, Emotional Support       Confirm Follow Up Transport: Family   Confirm Transport and Arrange: Yes   Plan discussed with Pt/Family/Caregiver: Yes   Freedom of Choice Offered: Yes      DISCHARGE LOCATION   Discharge Placement: Home with home health

## 2018-02-07 LAB — POCT GLUCOSE
POC Glucose: 202 mg/dL — ABNORMAL HIGH (ref 65–105)
POC Glucose: 222 mg/dL — ABNORMAL HIGH (ref 65–105)
POC Glucose: 116 mg/dL — ABNORMAL HIGH (ref 65–105)
POC Glucose: 174 mg/dL — ABNORMAL HIGH (ref 65–105)

## 2018-02-07 LAB — GLUCOSE, POC
Glucose (POC): 202 mg/dL — ABNORMAL HIGH (ref 65–105)
Glucose (POC): 222 mg/dL — ABNORMAL HIGH (ref 65–105)
Glucose (POC): 116 mg/dL — ABNORMAL HIGH (ref 65–105)
Glucose (POC): 174 mg/dL — ABNORMAL HIGH (ref 65–105)

## 2018-02-07 MED FILL — GABAPENTIN 100 MG CAP: 100 mg | ORAL | Qty: 1

## 2018-02-07 MED FILL — PREDNISONE 20 MG TAB: 20 mg | ORAL | Qty: 2

## 2018-02-07 MED FILL — MORPHINE 2 MG/ML INJECTION: 2 mg/mL | INTRAMUSCULAR | Qty: 2

## 2018-02-07 MED FILL — OXYCODONE-ACETAMINOPHEN 10 MG-325 MG TAB: 10-325 mg | ORAL | Qty: 1

## 2018-02-07 MED FILL — HEPARIN (PORCINE) 5,000 UNIT/ML IJ SOLN: 5000 unit/mL | INTRAMUSCULAR | Qty: 1

## 2018-02-07 MED FILL — FUROSEMIDE 40 MG TAB: 40 mg | ORAL | Qty: 1

## 2018-02-07 MED FILL — SPIRONOLACTONE 100 MG TAB: 100 mg | ORAL | Qty: 1

## 2018-02-07 MED FILL — CLONIDINE 0.1 MG TAB: 0.1 mg | ORAL | Qty: 3

## 2018-02-07 MED FILL — IPRATROPIUM-ALBUTEROL 2.5 MG-0.5 MG/3 ML NEB SOLUTION: 2.5 mg-0.5 mg/3 ml | RESPIRATORY_TRACT | Qty: 3

## 2018-02-07 MED FILL — ROSUVASTATIN 5 MG TAB: 5 mg | ORAL | Qty: 1

## 2018-02-07 MED FILL — LOSARTAN 50 MG TAB: 50 mg | ORAL | Qty: 2

## 2018-02-07 MED FILL — QUETIAPINE SR 50 MG 24 HR TAB: 50 mg | ORAL | Qty: 3

## 2018-02-07 MED FILL — MONTELUKAST 10 MG TAB: 10 mg | ORAL | Qty: 1

## 2018-02-07 MED FILL — DILTIAZEM ER 180 MG 24 HR CAP: 180 mg | ORAL | Qty: 1

## 2018-02-07 NOTE — Progress Notes (Signed)
Paged MD about patient's blood pressure elevating.    Paged: pt in room 4220, Pare, M. blood pressure is continuing to be elevated.  last bp reading was 149/120.  Any PRN meds? [phone]-M. Jordan    Waiting on response.

## 2018-02-07 NOTE — Progress Notes (Signed)
Problem: Breathing Pattern - Ineffective  Goal: *Absence of hypoxia  Outcome: Progressing Towards Goal  Goal: *Use of effective breathing techniques  Outcome: Progressing Towards Goal     Problem: Patient Education: Go to Patient Education Activity  Goal: Patient/Family Education  Outcome: Progressing Towards Goal

## 2018-02-07 NOTE — Progress Notes (Signed)
MD responded and stated just to give clonidine

## 2018-02-07 NOTE — Progress Notes (Signed)
Problem: Diabetes Self-Management  Goal: *Disease process and treatment process  Description  Define diabetes and identify own type of diabetes; list 3 options for treating diabetes.  Outcome: Progressing Towards Goal  Goal: *Incorporating nutritional management into lifestyle  Description  Describe effect of type, amount and timing of food on blood glucose; list 3 methods for planning meals.  Outcome: Progressing Towards Goal  Goal: *Incorporating physical activity into lifestyle  Description  State effect of exercise on blood glucose levels.  Outcome: Progressing Towards Goal  Goal: *Developing strategies to promote health/change behavior  Description  Define the ABC's of diabetes; identify appropriate screenings, schedule and personal plan for screenings.  Outcome: Progressing Towards Goal  Goal: *Using medications safely  Description  State effect of diabetes medications on diabetes; name diabetes medication taking, action and side effects.  Outcome: Progressing Towards Goal  Goal: *Monitoring blood glucose, interpreting and using results  Description  Identify recommended blood glucose targets  and personal targets.  Outcome: Progressing Towards Goal  Goal: *Prevention, detection, treatment of acute complications  Description  List symptoms of hyper- and hypoglycemia; describe how to treat low blood sugar and actions for lowering  high blood glucose level.  Outcome: Progressing Towards Goal  Goal: *Prevention, detection and treatment of chronic complications  Description  Define the natural course of diabetes and describe the relationship of blood glucose levels to long term complications of diabetes.  Outcome: Progressing Towards Goal  Goal: *Developing strategies to address psychosocial issues  Description  Describe feelings about living with diabetes; identify support needed and support network  Outcome: Progressing Towards Goal  Goal: *Insulin pump training  Outcome: Progressing Towards Goal   Goal: *Sick day guidelines  Outcome: Progressing Towards Goal  Goal: *Patient Specific Goal (EDIT GOAL, INSERT TEXT)  Outcome: Progressing Towards Goal     Problem: Patient Education: Go to Patient Education Activity  Goal: Patient/Family Education  Outcome: Progressing Towards Goal     Problem: Breathing Pattern - Ineffective  Goal: *Absence of hypoxia  Outcome: Progressing Towards Goal  Goal: *Use of effective breathing techniques  Outcome: Progressing Towards Goal     Problem: Falls - Risk of  Goal: *Absence of Falls  Description  Document [name] [name] Fall Risk and appropriate interventions in the flowsheet.  Outcome: Progressing Towards Goal  Note:   Fall Risk Interventions:  Mobility Interventions: Bed/chair exit alarm         Medication Interventions: Bed/chair exit alarm    Elimination Interventions: Call light in reach              Problem: Patient Education: Go to Patient Education Activity  Goal: Patient/Family Education  Outcome: Progressing Towards Goal     Problem: Pressure Injury - Risk of  Goal: *Prevention of pressure injury  Description  Document Braden Scale and appropriate interventions in the flowsheet.  Outcome: Progressing Towards Goal  Note:   Pressure Injury Interventions:  Sensory Interventions: Assess changes in LOC    Moisture Interventions: Absorbent underpads    Activity Interventions: Increase time out of bed    Mobility Interventions: Pressure redistribution bed/mattress (bed type)    Nutrition Interventions: Document food/fluid/supplement intake                     Problem: Patient Education: Go to Patient Education Activity  Goal: Patient/Family Education  Outcome: Progressing Towards Goal

## 2018-02-07 NOTE — Progress Notes (Signed)
Pt Aox4. PRN morphine given x 2 and PRN percocet given x 1 for c/o abdominal and left hip pain. Pt slept well overnight with no complaints. VSS. Pt encouraged to get OOB and ambulate. No falls/injuries this shift. Side rails up x 2. Bed locked and in lowest position. Call bed in reach. No needs voiced at this time.     Problem: Diabetes Self-Management  Goal: *Disease process and treatment process  Description  Define diabetes and identify own type of diabetes; list 3 options for treating diabetes.  Outcome: Progressing Towards Goal  Goal: *Incorporating nutritional management into lifestyle  Description  Describe effect of type, amount and timing of food on blood glucose; list 3 methods for planning meals.  Outcome: Progressing Towards Goal  Goal: *Incorporating physical activity into lifestyle  Description  State effect of exercise on blood glucose levels.  Outcome: Progressing Towards Goal  Goal: *Developing strategies to promote health/change behavior  Description  Define the ABC's of diabetes; identify appropriate screenings, schedule and personal plan for screenings.  Outcome: Progressing Towards Goal  Goal: *Using medications safely  Description  State effect of diabetes medications on diabetes; name diabetes medication taking, action and side effects.  Outcome: Progressing Towards Goal  Goal: *Monitoring blood glucose, interpreting and using results  Description  Identify recommended blood glucose targets  and personal targets.  Outcome: Progressing Towards Goal  Goal: *Prevention, detection, treatment of acute complications  Description  List symptoms of hyper- and hypoglycemia; describe how to treat low blood sugar and actions for lowering  high blood glucose level.  Outcome: Progressing Towards Goal  Goal: *Prevention, detection and treatment of chronic complications  Description  Define the natural course of diabetes and describe the relationship of  blood glucose levels to long term complications of diabetes.  Outcome: Progressing Towards Goal  Goal: *Developing strategies to address psychosocial issues  Description  Describe feelings about living with diabetes; identify support needed and support network  Outcome: Progressing Towards Goal  Goal: *Insulin pump training  Outcome: Progressing Towards Goal  Goal: *Sick day guidelines  Outcome: Progressing Towards Goal  Goal: *Patient Specific Goal (EDIT GOAL, INSERT TEXT)  Outcome: Progressing Towards Goal     Problem: Patient Education: Go to Patient Education Activity  Goal: Patient/Family Education  Outcome: Progressing Towards Goal     Problem: Breathing Pattern - Ineffective  Goal: *Absence of hypoxia  Outcome: Progressing Towards Goal  Goal: *Use of effective breathing techniques  Outcome: Progressing Towards Goal     Problem: Falls - Risk of  Goal: *Absence of Falls  Description  Document Bridgette HabermannSchmid Fall Risk and appropriate interventions in the flowsheet.  Outcome: Progressing Towards Goal  Note:   Fall Risk Interventions:  Mobility Interventions: Bed/chair exit alarm, Patient to call before getting OOB         Medication Interventions: Bed/chair exit alarm, Teach patient to arise slowly, Patient to call before getting OOB    Elimination Interventions: Bed/chair exit alarm, Call light in reach, Patient to call for help with toileting needs              Problem: Patient Education: Go to Patient Education Activity  Goal: Patient/Family Education  Outcome: Progressing Towards Goal     Problem: Pressure Injury - Risk of  Goal: *Prevention of pressure injury  Description  Document Braden Scale and appropriate interventions in the flowsheet.  Outcome: Progressing Towards Goal  Note:   Pressure Injury Interventions:  Sensory Interventions: Assess changes in LOC  Moisture Interventions: Absorbent underpads, Internal/External urinary devices, Minimize layers    Activity Interventions: Increase time out of bed     Mobility Interventions: Pressure redistribution bed/mattress (bed type)    Nutrition Interventions: Document food/fluid/supplement intake                     Problem: Patient Education: Go to Patient Education Activity  Goal: Patient/Family Education  Outcome: Progressing Towards Goal

## 2018-02-07 NOTE — Progress Notes (Signed)
Medicine Progress Note    Patient: [name] ShutterMonica R [name]   Age:  67 y.o.  DOA: 02/04/2018     LOS:  LOS: 3 days     Assessment/Plan   Anterior abdominal wall incarcerated fat hernia  Abdominal pain  Acute COPD exacerbation mild  Diabetes type 2 uncontrolled with hyperglycemia  Hypertension  GERD  Chronic pain syndrome  Hepatitis C    -Surgery evaluation for abdominal pain due to hernia, no indication for immediate intervention, this would best done electively when patient recovers from COPD exacerbation and off all steroids  -Analgesia as needed  -Continue  prednisone  -Duo-neb RTC  -Albuterol Nebs PRN  -Supplemental O2 as needed  -Glucomander for blood sugar control  -Continue home meds for hypertension with the addition of Lasix  -Monitor in the hospital overnight if no new issues overnight and breathing continues to improve will anticipate discharge to home tomorrow          Subjective:   Patient seen and examined. No complaints, shortness of breath improving abdominal pain better controlled on pain meds no N/V, F/C, CP   Objective:     Visit Vitals  BP 118/87 (BP 1 Location: Left arm, BP Patient Position: Supine)   Pulse 84   Temp 98.2 ??F (36.8 ??C)   Resp 20   SpO2 96%       Physical Exam:  General appearance: alert, cooperative, no distress, appears stated age  Head: Normocephalic, without obvious abnormality, atraumatic  Neck: supple, trachea midline  Lungs: Poor air movement throughout, no wheezing appreciated  Heart: regular rate and rhythm, S1, S2 normal, no murmur, click, rub or gallop  Abdomen: soft, firmness felt in abdomen just above the umbilicus non-tender. Bowel sounds normal.   no organomegaly  Extremities: extremities normal, atraumatic, no cyanosis or edema      Medications Reviewed:  Current Facility-Administered Medications   Medication Dose Route Frequency   ??? predniSONE (DELTASONE) tablet 40 mg  40 mg Oral BID WITH MEALS   ??? furosemide (LASIX) tablet 40 mg  40 mg Oral DAILY    ??? albuterol-ipratropium (DUO-NEB) 2.5 MG-0.5 MG/3 ML  3 mL Nebulization TID RT   ??? cloNIDine HCl (CATAPRES) tablet 0.3 mg  0.3 mg Oral BID   ??? dilTIAZem CD (CARDIZEM CD) capsule 180 mg  180 mg Oral DAILY   ??? fluticasone-vilanterol (BREO ELLIPTA) 12700mcg-25mcg/puff  1 Puff Inhalation DAILY   ??? gabapentin (NEURONTIN) capsule 100 mg  100 mg Oral TID   ??? losartan (COZAAR) tablet 100 mg  100 mg Oral DAILY   ??? montelukast (SINGULAIR) tablet 10 mg  10 mg Oral QHS   ??? QUEtiapine SR (SEROquel XR) tablet 150 mg  150 mg Oral QHS   ??? traZODone (DESYREL) tablet 50 mg  50 mg Oral QHS PRN   ??? spironolactone (ALDACTONE) tablet 100 mg  100 mg Oral DAILY   ??? rosuvastatin (CRESTOR) tablet 5 mg  5 mg Oral QHS   ??? naloxone (NARCAN) injection 0.1 mg  0.1 mg IntraVENous PRN   ??? dextrose (D50) infusion 5-25 g  10-50 mL IntraVENous PRN   ??? glucagon (GLUCAGEN) injection 1 mg  1 mg IntraMUSCular PRN   ??? acetaminophen (TYLENOL) tablet 650 mg  650 mg Oral Q4H PRN    Or   ??? acetaminophen (TYLENOL) solution 650 mg  650 mg Oral Q4H PRN    Or   ??? acetaminophen (TYLENOL) suppository 650 mg  650 mg Rectal Q4H PRN   ???  morphine injection 2-4 mg  2-4 mg IntraVENous Q4H PRN   ??? oxyCODONE-acetaminophen (PERCOCET 10) 10-325 mg per tablet 1 Tab  1 Tab Oral Q4H PRN   ??? diphenhydrAMINE (BENADRYL) injection 25 mg  25 mg IntraVENous Q4H PRN   ??? ondansetron (ZOFRAN) injection 4 mg  4 mg IntraVENous Q4H PRN   ??? heparin (porcine) injection 5,000 Units  5,000 Units SubCUTAneous Q8H   ??? insulin glargine (LANTUS) injection 1-100 Units  1-100 Units SubCUTAneous QHS   ??? insulin lispro (HUMALOG) injection 1-100 Units  1-100 Units SubCUTAneous AC&HS   ??? insulin lispro (HUMALOG) injection 1-100 Units  1-100 Units SubCUTAneous PRN   ??? albuterol (PROVENTIL VENTOLIN) nebulizer solution 2.5 mg  2.5 mg Nebulization Q4H PRN         Intake and Output:  Current Shift:  08/28 0701 - 08/28 1900  In: 1200 [P.O.:1200]  Out: -   Last three shifts:  08/26 1901 - 08/28 0700   In: 1200 [P.O.:1200]  Out: 2800 [Urine:2800]    Lab/Data Reviewed:  All lab and imaging  results for the last 24 hours reviewed.    Kristie Cowman, MD  P# [phone]  February 07, 2018    [facility] medical dictation software was used for portions of this report.  Unintended voice transcription errors may have occurred.

## 2018-02-07 NOTE — Other (Signed)
Bedside and Verbal shift change report given to Megan, RN (oncoming nurse) by Gina, RN (offgoing nurse). Report included the following information SBAR, Kardex, Intake/Output, MAR and Recent Results.

## 2018-02-07 NOTE — Other (Signed)
[id number]  MESSAGE: the pt in [address] needs a new iv for pain meds. Adrienne [phone]

## 2018-02-07 NOTE — Progress Notes (Signed)
MD responded and stated just to give clonidine

## 2018-02-07 NOTE — Progress Notes (Signed)
Paged MD about patient's blood pressure elevating.    Paged: pt in room [address], Barry DienesOwens, M. blood pressure is continuing to be elevated.  last bp reading was 149/120.  Any PRN meds? [phone]. [place]    Waiting on response.

## 2018-02-07 NOTE — Progress Notes (Signed)
Pt Aox4. PRN morphine given x 2 and PRN percocet given x 1 for c/o abdominal and left hip pain. Pt slept well overnight with no complaints. VSS. Pt encouraged to get OOB and ambulate. No falls/injuries this shift. Side rails up x 2. Bed locked and in lowest position. Call bed in reach. No needs voiced at this time.     Problem: Diabetes Self-Management  Goal: *Disease process and treatment process  Description  Define diabetes and identify own type of diabetes; list 3 options for treating diabetes.  Outcome: Progressing Towards Goal  Goal: *Incorporating nutritional management into lifestyle  Description  Describe effect of type, amount and timing of food on blood glucose; list 3 methods for planning meals.  Outcome: Progressing Towards Goal  Goal: *Incorporating physical activity into lifestyle  Description  State effect of exercise on blood glucose levels.  Outcome: Progressing Towards Goal  Goal: *Developing strategies to promote health/change behavior  Description  Define the ABC's of diabetes; identify appropriate screenings, schedule and personal plan for screenings.  Outcome: Progressing Towards Goal  Goal: *Using medications safely  Description  State effect of diabetes medications on diabetes; name diabetes medication taking, action and side effects.  Outcome: Progressing Towards Goal  Goal: *Monitoring blood glucose, interpreting and using results  Description  Identify recommended blood glucose targets  and personal targets.  Outcome: Progressing Towards Goal  Goal: *Prevention, detection, treatment of acute complications  Description  List symptoms of hyper- and hypoglycemia; describe how to treat low blood sugar and actions for lowering  high blood glucose level.  Outcome: Progressing Towards Goal  Goal: *Prevention, detection and treatment of chronic complications  Description  Define the natural course of diabetes and describe the relationship of blood glucose levels to long term complications of  diabetes.  Outcome: Progressing Towards Goal  Goal: *Developing strategies to address psychosocial issues  Description  Describe feelings about living with diabetes; identify support needed and support network  Outcome: Progressing Towards Goal  Goal: *Insulin pump training  Outcome: Progressing Towards Goal  Goal: *Sick day guidelines  Outcome: Progressing Towards Goal  Goal: *Patient Specific Goal (EDIT GOAL, INSERT TEXT)  Outcome: Progressing Towards Goal     Problem: Patient Education: Go to Patient Education Activity  Goal: Patient/Family Education  Outcome: Progressing Towards Goal     Problem: Breathing Pattern - Ineffective  Goal: *Absence of hypoxia  Outcome: Progressing Towards Goal  Goal: *Use of effective breathing techniques  Outcome: Progressing Towards Goal     Problem: Falls - Risk of  Goal: *Absence of Falls  Description  Document Bridgette HabermannSchmid Fall Risk and appropriate interventions in the flowsheet.  Outcome: Progressing Towards Goal  Note:   Fall Risk Interventions:  Mobility Interventions: Bed/chair exit alarm, Patient to call before getting OOB         Medication Interventions: Bed/chair exit alarm, Teach patient to arise slowly, Patient to call before getting OOB    Elimination Interventions: Bed/chair exit alarm, Call light in reach, Patient to call for help with toileting needs              Problem: Patient Education: Go to Patient Education Activity  Goal: Patient/Family Education  Outcome: Progressing Towards Goal     Problem: Pressure Injury - Risk of  Goal: *Prevention of pressure injury  Description  Document Braden Scale and appropriate interventions in the flowsheet.  Outcome: Progressing Towards Goal  Note:   Pressure Injury Interventions:  Sensory Interventions: Assess changes in LOC  Moisture Interventions: Absorbent underpads, Internal/External urinary devices, Minimize layers    Activity Interventions: Increase time out of bed    Mobility Interventions: Pressure redistribution  bed/mattress (bed type)    Nutrition Interventions: Document food/fluid/supplement intake                     Problem: Patient Education: Go to Patient Education Activity  Goal: Patient/Family Education  Outcome: Progressing Towards Goal

## 2018-02-07 NOTE — Progress Notes (Signed)
Medicine Progress Note    Patient: [name] [name]   Age:  67 y.o.  DOA: 02/04/2018     LOS:  LOS: 3 days     Assessment/Plan   Anterior abdominal wall incarcerated fat hernia  Abdominal pain  Acute COPD exacerbation mild  Diabetes type 2 uncontrolled with hyperglycemia  Hypertension  GERD  Chronic pain syndrome  Hepatitis C    -Surgery evaluation for abdominal pain due to hernia, no indication for immediate intervention, this would best done electively when patient recovers from COPD exacerbation and off all steroids  -Analgesia as needed  -Continue  prednisone  -Duo-neb RTC  -Albuterol Nebs PRN  -Supplemental O2 as needed  -Glucomander for blood sugar control  -Continue home meds for hypertension with the addition of Lasix  -Monitor in the hospital overnight if no new issues overnight and breathing continues to improve will anticipate discharge to home tomorrow          Subjective:   Patient seen and examined. No complaints, shortness of breath improving abdominal pain better controlled on pain meds no N/V, F/C, CP   Objective:     Visit Vitals  BP 118/87 (BP 1 Location: Left arm, BP Patient Position: Supine)   Pulse 84   Temp 98.2 ??F (36.8 ??C)   Resp 20   SpO2 96%       Physical Exam:  General appearance: alert, cooperative, no distress, appears stated age  Head: Normocephalic, without obvious abnormality, atraumatic  Neck: supple, trachea midline  Lungs: Poor air movement throughout, no wheezing appreciated  Heart: regular rate and rhythm, S1, S2 normal, no murmur, click, rub or gallop  Abdomen: soft, firmness felt in abdomen just above the umbilicus non-tender. Bowel sounds normal.   no organomegaly  Extremities: extremities normal, atraumatic, no cyanosis or edema      Medications Reviewed:  Current Facility-Administered Medications   Medication Dose Route Frequency   ??? predniSONE (DELTASONE) tablet 40 mg  40 mg Oral BID WITH MEALS   ??? furosemide (LASIX) tablet 40 mg  40 mg Oral DAILY   ??? albuterol-ipratropium  (DUO-NEB) 2.5 MG-0.5 MG/3 ML  3 mL Nebulization TID RT   ??? cloNIDine HCl (CATAPRES) tablet 0.3 mg  0.3 mg Oral BID   ??? dilTIAZem CD (CARDIZEM CD) capsule 180 mg  180 mg Oral DAILY   ??? fluticasone-vilanterol (BREO ELLIPTA) 189mcg-25mcg/puff  1 Puff Inhalation DAILY   ??? gabapentin (NEURONTIN) capsule 100 mg  100 mg Oral TID   ??? losartan (COZAAR) tablet 100 mg  100 mg Oral DAILY   ??? montelukast (SINGULAIR) tablet 10 mg  10 mg Oral QHS   ??? QUEtiapine SR (SEROquel XR) tablet 150 mg  150 mg Oral QHS   ??? traZODone (DESYREL) tablet 50 mg  50 mg Oral QHS PRN   ??? spironolactone (ALDACTONE) tablet 100 mg  100 mg Oral DAILY   ??? rosuvastatin (CRESTOR) tablet 5 mg  5 mg Oral QHS   ??? naloxone (NARCAN) injection 0.1 mg  0.1 mg IntraVENous PRN   ??? dextrose (D50) infusion 5-25 g  10-50 mL IntraVENous PRN   ??? glucagon (GLUCAGEN) injection 1 mg  1 mg IntraMUSCular PRN   ??? acetaminophen (TYLENOL) tablet 650 mg  650 mg Oral Q4H PRN    Or   ??? acetaminophen (TYLENOL) solution 650 mg  650 mg Oral Q4H PRN    Or   ??? acetaminophen (TYLENOL) suppository 650 mg  650 mg Rectal Q4H PRN   ???  morphine injection 2-4 mg  2-4 mg IntraVENous Q4H PRN   ??? oxyCODONE-acetaminophen (PERCOCET 10) 10-325 mg per tablet 1 Tab  1 Tab Oral Q4H PRN   ??? diphenhydrAMINE (BENADRYL) injection 25 mg  25 mg IntraVENous Q4H PRN   ??? ondansetron (ZOFRAN) injection 4 mg  4 mg IntraVENous Q4H PRN   ??? heparin (porcine) injection 5,000 Units  5,000 Units SubCUTAneous Q8H   ??? insulin glargine (LANTUS) injection 1-100 Units  1-100 Units SubCUTAneous QHS   ??? insulin lispro (HUMALOG) injection 1-100 Units  1-100 Units SubCUTAneous AC&HS   ??? insulin lispro (HUMALOG) injection 1-100 Units  1-100 Units SubCUTAneous PRN   ??? albuterol (PROVENTIL VENTOLIN) nebulizer solution 2.5 mg  2.5 mg Nebulization Q4H PRN         Intake and Output:  Current Shift:  08/28 0701 - 08/28 1900  In: 1200 [P.O.:1200]  Out: -   Last three shifts:  08/26 1901 - 08/28 0700  In: 1200 [P.O.:1200]  Out: 2800  [Urine:2800]    Lab/Data Reviewed:  All lab and imaging  results for the last 24 hours reviewed.    Kristie Cowmanoy P Adrionna Delcid, MD  P# [phone]  February 07, 2018    [facility] medical dictation software was used for portions of this report.  Unintended voice transcription errors may have occurred.

## 2018-02-07 NOTE — Progress Notes (Signed)
Problem: Diabetes Self-Management  Goal: *Disease process and treatment process  Description  Define diabetes and identify own type of diabetes; list 3 options for treating diabetes.  Outcome: Progressing Towards Goal  Goal: *Incorporating nutritional management into lifestyle  Description  Describe effect of type, amount and timing of food on blood glucose; list 3 methods for planning meals.  Outcome: Progressing Towards Goal  Goal: *Incorporating physical activity into lifestyle  Description  State effect of exercise on blood glucose levels.  Outcome: Progressing Towards Goal  Goal: *Developing strategies to promote health/change behavior  Description  Define the ABC's of diabetes; identify appropriate screenings, schedule and personal plan for screenings.  Outcome: Progressing Towards Goal  Goal: *Using medications safely  Description  State effect of diabetes medications on diabetes; name diabetes medication taking, action and side effects.  Outcome: Progressing Towards Goal  Goal: *Monitoring blood glucose, interpreting and using results  Description  Identify recommended blood glucose targets  and personal targets.  Outcome: Progressing Towards Goal  Goal: *Prevention, detection, treatment of acute complications  Description  List symptoms of hyper- and hypoglycemia; describe how to treat low blood sugar and actions for lowering  high blood glucose level.  Outcome: Progressing Towards Goal  Goal: *Prevention, detection and treatment of chronic complications  Description  Define the natural course of diabetes and describe the relationship of blood glucose levels to long term complications of diabetes.  Outcome: Progressing Towards Goal  Goal: *Developing strategies to address psychosocial issues  Description  Describe feelings about living with diabetes; identify support needed and support network  Outcome: Progressing Towards Goal  Goal: *Insulin pump training  Outcome: Progressing Towards Goal  Goal: *Sick  day guidelines  Outcome: Progressing Towards Goal  Goal: *Patient Specific Goal (EDIT GOAL, INSERT TEXT)  Outcome: Progressing Towards Goal     Problem: Patient Education: Go to Patient Education Activity  Goal: Patient/Family Education  Outcome: Progressing Towards Goal     Problem: Breathing Pattern - Ineffective  Goal: *Absence of hypoxia  Outcome: Progressing Towards Goal  Goal: *Use of effective breathing techniques  Outcome: Progressing Towards Goal     Problem: Falls - Risk of  Goal: *Absence of Falls  Description  Document [name] Fall Risk and appropriate interventions in the flowsheet.  Outcome: Progressing Towards Goal  Note:   Fall Risk Interventions:  Mobility Interventions: Bed/chair exit alarm         Medication Interventions: Bed/chair exit alarm    Elimination Interventions: Call light in reach              Problem: Patient Education: Go to Patient Education Activity  Goal: Patient/Family Education  Outcome: Progressing Towards Goal     Problem: Pressure Injury - Risk of  Goal: *Prevention of pressure injury  Description  Document Braden Scale and appropriate interventions in the flowsheet.  Outcome: Progressing Towards Goal  Note:   Pressure Injury Interventions:  Sensory Interventions: Assess changes in LOC    Moisture Interventions: Absorbent underpads    Activity Interventions: Increase time out of bed    Mobility Interventions: Pressure redistribution bed/mattress (bed type)    Nutrition Interventions: Document food/fluid/supplement intake                     Problem: Patient Education: Go to Patient Education Activity  Goal: Patient/Family Education  Outcome: Progressing Towards Goal

## 2018-02-08 LAB — POCT GLUCOSE
POC Glucose: 212 mg/dL — ABNORMAL HIGH (ref 65–105)
POC Glucose: 148 mg/dL — ABNORMAL HIGH (ref 65–105)
POC Glucose: 148 mg/dL — ABNORMAL HIGH (ref 65–105)

## 2018-02-08 LAB — GLUCOSE, POC
Glucose (POC): 212 mg/dL — ABNORMAL HIGH (ref 65–105)
Glucose (POC): 148 mg/dL — ABNORMAL HIGH (ref 65–105)
Glucose (POC): 148 mg/dL — ABNORMAL HIGH (ref 65–105)

## 2018-02-08 MED ORDER — OXYCODONE-ACETAMINOPHEN 5 MG-325 MG TAB
5-325 mg | ORAL_TABLET | Freq: Four times a day (QID) | ORAL | 0 refills | Status: AC | PRN
Start: 2018-02-08 — End: 2018-02-15

## 2018-02-08 MED ORDER — PREDNISONE 20 MG TAB
20 mg | ORAL_TABLET | Freq: Every day | ORAL | 0 refills | Status: DC
Start: 2018-02-08 — End: 2018-02-08

## 2018-02-08 MED ORDER — LOSARTAN 100 MG TAB
100 mg | ORAL_TABLET | ORAL | 2 refills | Status: DC
Start: 2018-02-08 — End: 2018-02-18

## 2018-02-08 MED ORDER — PREDNISONE 20 MG TAB
20 mg | ORAL_TABLET | Freq: Every day | ORAL | 0 refills | Status: DC
Start: 2018-02-08 — End: 2018-02-18

## 2018-02-08 MED ORDER — CLONIDINE 0.3 MG TAB
0.3 mg | ORAL_TABLET | Freq: Two times a day (BID) | ORAL | 2 refills | Status: DC
Start: 2018-02-08 — End: 2018-02-24

## 2018-02-08 MED FILL — FUROSEMIDE 40 MG TAB: 40 mg | ORAL | Qty: 1

## 2018-02-08 MED FILL — QUETIAPINE SR 50 MG 24 HR TAB: 50 mg | ORAL | Qty: 3

## 2018-02-08 MED FILL — PREDNISONE 20 MG TAB: 20 mg | ORAL | Qty: 2

## 2018-02-08 MED FILL — MORPHINE 2 MG/ML INJECTION: 2 mg/mL | INTRAMUSCULAR | Qty: 1

## 2018-02-08 MED FILL — CLONIDINE 0.1 MG TAB: 0.1 mg | ORAL | Qty: 3

## 2018-02-08 MED FILL — OXYCODONE-ACETAMINOPHEN 10 MG-325 MG TAB: 10-325 mg | ORAL | Qty: 1

## 2018-02-08 MED FILL — GABAPENTIN 100 MG CAP: 100 mg | ORAL | Qty: 1

## 2018-02-08 MED FILL — DILTIAZEM ER 180 MG 24 HR CAP: 180 mg | ORAL | Qty: 1

## 2018-02-08 MED FILL — LOSARTAN 50 MG TAB: 50 mg | ORAL | Qty: 2

## 2018-02-08 MED FILL — IPRATROPIUM-ALBUTEROL 2.5 MG-0.5 MG/3 ML NEB SOLUTION: 2.5 mg-0.5 mg/3 ml | RESPIRATORY_TRACT | Qty: 3

## 2018-02-08 MED FILL — ROSUVASTATIN 5 MG TAB: 5 mg | ORAL | Qty: 1

## 2018-02-08 MED FILL — MORPHINE 2 MG/ML INJECTION: 2 mg/mL | INTRAMUSCULAR | Qty: 2

## 2018-02-08 MED FILL — HEPARIN (PORCINE) 5,000 UNIT/ML IJ SOLN: 5000 unit/mL | INTRAMUSCULAR | Qty: 1

## 2018-02-08 MED FILL — MONTELUKAST 10 MG TAB: 10 mg | ORAL | Qty: 1

## 2018-02-08 MED FILL — SPIRONOLACTONE 100 MG TAB: 100 mg | ORAL | Qty: 1

## 2018-02-08 NOTE — Other (Addendum)
----------  DocumentID: [id number]------------------------------------------------              [facility]                       Patient Education Report         Name: Tylene FantasiaOWENS, Jadae                  Date: 02/04/2018    MRN: [id number]                    Time: 5:49:37 PM         Patient ordered video: 'Patient Safety: Stay Safe While you are in the Hospital'    from [id number] via phone number: [phone] at 5:49:37 PM    Description: This program outlines some of the precautions patients can take to ensure a speedy recovery without extra complications. The video emphasizes the importance of communicating with the healthcare team.    ----------DocumentID: [id number]------------------------------------------------                       [facility]          Patient Education Report - Discharge Summary        Date: 02/08/2018   Time: 3:52:17 PM   Name: Tylene FantasiaOWENS, Mozell   MRN: [id number]      Account Number: [id number]      Education History:        Patient ordered video: 'Patient Safety: Stay Safe While you are in the Hospital' from [id number] on 02/04/2018 05:49:37 PM

## 2018-02-08 NOTE — Discharge Summary (Signed)
DISCHARGE SUMMARY     Patient Name: [name] [name]  Medical Record Number: [id number]  Date of Birth: 1950-09-12  Discharge Provider: Stephanie Coup, MD  Primary Care Provider: Wallene Huh, PA-C    Admit date: 02/04/2018  Discharge date: 02/08/2018  Discharge Disposition: Home  Code Status: Full Code    Follow-up appointments:  Follow-up Information     Follow up With Specialties Details Why Contact Info    Wallene Huh, PA-C Physician Assistant   [place] [id number]  [phone]            Follow-up recommendations:  Dr. Eulas Post surgery for evaluation of fat-containing abdominal hernia repair  Primary medical doctor or Dr. Leonia Reader  of pulmonary 1 week    Discharge diagnosis:  Anterior abdominal wall incarcerated fat hernia  Abdominal pain  Acute COPD exacerbation mild  Diabetes type 2 uncontrolled with hyperglycemia  Hypertension  GERD  Chronic pain syndrome  Hepatitis C  ??  -Surgery evaluation for abdominal pain due to hernia, no indication for immediate intervention, this would best done electively when patient recovers from COPD exacerbation and off all steroids.  We will have patient follow-up in short-term with Dr. Eulas Post of surgery to evaluate when this repair will be best performed  -Analgesia as needed, patient discharged with small supply of narcotic pain medication  -Continue  prednisone, home with 5 additional days  -Resume home meds for diabetes  -Resume home meds for hypertension, patient has had elevated diastolic blood pressures in hospital suspect this is due to steroid effect and should improve when steroids discontinued                 Additional diagnoses :   Past Medical History:   Diagnosis Date   ??? Arthritis    ??? 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 COPD (chronic obstructive pulmonary disease) ([place])    ??? DM2 (diabetes mellitus, type 2) ([place])    ??? GERD (gastroesophageal reflux disease)    ??? Hepatitis C     HEP C    ??? HTN (hypertension)    ??? Lung nodule, multiple     (CT 10/2016) Small left upper lobe and 1.7 x 1.6 cm left lower lobe nodules not significantly changed from 07/23/2016 and 03/22/2016   ??? Marijuana use    ??? PTSD (post-traumatic stress disorder)     lived through the [place] in 1993   ??? Thoracic ascending aortic aneurysm ([place])     4.4cm noted on CT Chest (10/2016)   ??? Thyroid nodule     2.5 cm stable right thyroid nodule (CT 10/2016)                                                        Discharge Medications:   Current Discharge Medication List      START taking these medications    Details   predniSONE (DELTASONE) 20 mg tablet Take 40 mg by mouth daily (with breakfast).  Qty: 10 Tab, Refills: 0         CONTINUE these medications which have CHANGED    Details   oxyCODONE-acetaminophen (PERCOCET) 5-325 mg per tablet Take 1 Tab by mouth every six (  6) hours as needed for Pain for up to 7 days. Max Daily Amount: 4 Tabs.  Qty: 30 Tab, Refills: 0    Associated Diagnoses: Abdominal pain, unspecified abdominal location         CONTINUE these medications which have NOT CHANGED    Details   insulin glargine (LANTUS SOLOSTAR U-100 INSULIN) 100 unit/mL (3 mL) inpn 32 Units by SubCUTAneous route nightly.  Qty: 15 mL, Refills: 2    Comments: Appt required for additional refills  Associated Diagnoses: Type 2 diabetes mellitus with diabetic neuropathy, without long-term current use of insulin (HCC)      metFORMIN ER (GLUCOPHAGE XR) 500 mg tablet Take 1 Tab by mouth two (2) times a day.  Qty: 60 Tab, Refills: 2      gabapentin (NEURONTIN) 100 mg capsule TAKE 1 CAPSULE BY MOUTH THREE TIMES DAILY  Qty: 90 Cap, Refills: 2    Associated Diagnoses: Type 2 diabetes mellitus with diabetic neuropathy, without long-term current use of insulin (HCC)      dilTIAZem CD (CARDIZEM CD) 180 mg ER capsule Take 1 Cap by mouth daily.  Qty: 90 Cap, Refills: 1      montelukast (SINGULAIR) 10 mg tablet TAKE 1 TABLET BY MOUTH DAILY   Qty: 90 Tab, Refills: 0    Associated Diagnoses: Mild intermittent asthma, unspecified whether complicated; Chronic rhinitis      spironolactone (ALDACTONE) 100 mg tablet TAKE 1 TABLET BY MOUTH DAILY  Qty: 90 Tab, Refills: 0    Associated Diagnoses: Severe hypertension      ondansetron (ZOFRAN ODT) 4 mg disintegrating tablet Take 1 Tab by mouth every eight (8) hours as needed for Nausea. Indications: nausea  Qty: 12 Tab, Refills: 0      albuterol (ACCUNEB) 1.25 mg/3 mL nebu 3 mL by Nebulization route every six (6) hours as needed (SOB, wheezing). Indications: Asthma Attack  Qty: 100 Each, Refills: 0      nabumetone (RELAFEN) 750 mg tablet Take 750 mg by mouth two (2) times a day.      albuterol-ipratropium (DUO-NEB) 2.5 mg-0.5 mg/3 ml nebu 3 mL by Nebulization route.      tiotropium bromide (SPIRIVA RESPIMAT) 2.5 mcg/actuation inhaler Take 1 Puff by inhalation daily.  Qty: 1 Inhaler, Refills: 0    Associated Diagnoses: COPD with acute exacerbation (HCC)      fluticasone propion-salmeterol (ADVAIR DISKUS) 100-50 mcg/dose diskus inhaler Take 1 Puff by inhalation two (2) times a day.  Qty: 1 Inhaler, Refills: 0      magnesium oxide (MAG-OX) 400 mg tablet TAKE 1 TABLET BY MOUTH DAILY  Qty: 90 Tab, Refills: 0    Associated Diagnoses: Hypomagnesemia      traZODone (DESYREL) 50 mg tablet Take 1 Tab by mouth nightly as needed for Sleep.  Qty: 20 Tab, Refills: 0      QUEtiapine SR (SEROQUEL XR) 150 mg sr tablet TAKE 1 TABLET BY MOUTH NIGHTLY  Qty: 90 Tab, Refills: 1    Associated Diagnoses: Medication refill      rosuvastatin (CRESTOR) 5 mg tablet TAKE 1 TABLET BY MOUTH NIGHTLY  Qty: 90 Tab, Refills: 1    Associated Diagnoses: Type 2 diabetes mellitus with diabetic neuropathy, without long-term current use of insulin (HCC)      polyethylene glycol (MIRALAX) 17 gram packet Take 1 Packet by mouth daily.  Qty: 100 Packet, Refills: 1      triamcinolone (NASACORT) 55 mcg nasal inhaler 2 Sprays by Both Nostrils route daily.  Qty: 1 Bottle, Refills: 0    Associated Diagnoses: Mild intermittent asthma, unspecified whether complicated; Chronic rhinitis      losartan (COZAAR) 100 mg tablet TAKE 1 TABLET BY MOUTH DAILY FOR HYPERTENSION  Qty: 90 Tab, Refills: 0    Associated Diagnoses: Severe hypertension      cloNIDine HCl (CATAPRES) 0.3 mg tablet Take 1 Tab by mouth two (2) times a day. Take bid  Qty: 60 Tab, Refills: 2      Insulin Needles, Disposable, (NANO PEN NEEDLE) 32 gauge x 5/32" ndle To use with Lantus pen  Qty: 100 Pen Needle, Refills: 0      Blood Glucose Control High&Low (ACCU-CHEK AVIVA CONTROL SOLN) soln Check blood sugar daily  Qty: 1 Bottle, Refills: 0      Blood-Glucose Meter (ACCU-CHEK AVIVA PLUS METER) misc Check blood sugar daily  Qty: 1 Each, Refills: 0      !! glucose blood VI test strips (ACCU-CHEK AVIVA PLUS TEST STRP) strip Check blood sugar daily  Qty: 100 Strip, Refills: 5      alcohol swabs (BD SINGLE USE SWABS REGULAR) padm Check blood sugar daily  Qty: 100 Pad, Refills: 5      !! lancets (ACCU-CHEK SOFTCLIX LANCETS) misc Check blood sugar daily  Qty: 100 Each, Refills: 5      Blood-Glucose Meter monitoring kit Check sugars 2 times per day, fasting and 2 hours after dinner  Qty: 1 Kit, Refills: 0    Associated Diagnoses: Diabetes mellitus, new onset ([place])      !! Lancets misc Check sugars twice daily  Qty: 200 Each, Refills: 2    Associated Diagnoses: Diabetes mellitus, new onset ([place])      !! glucose blood VI test strips (ASCENSIA AUTODISC VI, ONE TOUCH ULTRA TEST VI) strip Check sugars twice daily  Qty: 100 Strip, Refills: 5    Associated Diagnoses: Diabetes mellitus, new onset ([place])       !! - Potential duplicate medications found. Please discuss with provider.      STOP taking these medications       nitrofurantoin, macrocrystal-monohydrate, (MACROBID) 100 mg capsule Comments:   Reason for Stopping:               Discharge Diet:  Diet: Diabetic Diet    Consultants: General Surgery    Labs:  Labs: Results:        Chemistry No results for input(s): GLU, NA, K, CL, CO2, BUN, CREA, CA, AGAP, BUCR, TBIL, GPT, AP, TP, ALB, GLOB, AGRAT in the last 72 hours.   CBC w/Diff No results for input(s): WBC, RBC, HGB, HCT, PLT, GRANS, LYMPH, EOS, HGBEXT, HCTEXT, PLTEXT in the last 72 hours.   Cardiac Enzymes No results for input(s): CPK, CKND1, MYO in the last 72 hours.    No lab exists for component: CKRMB, TROIP   Coagulation No results for input(s): PTP, INR, APTT in the last 72 hours.    No lab exists for component: INREXT    Lipid Panel Lab Results   Component Value Date/Time    Cholesterol, total 130 (L) 06/16/2017 06:20 AM    HDL Cholesterol 63 06/16/2017 06:20 AM    LDL, calculated 55 06/16/2017 06:20 AM    VLDL, calculated 31.8 06/10/2016 01:00 PM    Triglyceride 61 06/16/2017 06:20 AM    CHOL/HDL Ratio 2.1 06/16/2017 06:20 AM      BNP No results for input(s): BNPP in the last 72 hours.   Liver Enzymes No results  for input(s): TP, ALB, TBIL, AP, SGOT, GPT in the last 72 hours.    No lab exists for component: DBIL   Thyroid Studies Lab Results   Component Value Date/Time    TSH 0.506 09/08/2017 09:30 AM            Imaging / Diagnostic studies:   CT ABD PELV W CONT (Accession [id number]) (Order [id number])   Allergies??     Low: Chocolate [Cocoa]   Exam Information     Status Exam Begun  Exam Ended    Final [99] 01/29/2018 13:26 01/29/2018 13:40   Result Information     Status: Final result (Exam End: 01/29/2018 13:40) Provider Status: Open   Study Result     DICOM format image data is available to non-affiliated external healthcare  facilities or entities on a secure, media free, reciprocally searchable basis  with patient authorization for 12 months following the date of the study.  ??  Clinical history: Ventral hernia, abdominal pain  ??  EXAMINATION:  CT scan of the abdomen and pelvis 01/29/2018. 5 mm spiral scanning is performed  from the costophrenic angles to the symphysis pubis. Coronal and sagittal   reconstruction imaging has been obtained.  ??  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 Osteopenia and degenerative changes of the spine.  Right hip prosthesis. There are splenic hypodensities in size from study of  08/27/2016.  ??  Spleen, adrenal glands and bladder are unremarkable. Uterus is absent.  Subcentimeter renal hypodensities too small to characterize. 7 mm nonobstructing  right nephrolithiasis.  ??  There are colonic diverticuli. Colon, terminal ileum, appendix, stomach and  small bowel loops are within normal limits. Abdominal aorta is within normal  limits.  ??  Small to borderline lymph nodes at the porta hepatis. Fat and fluid containing  anterior abdominal wall hernia with surrounding inflammation suggesting  incarceration.  ??  IMPRESSION  IMPRESSION:  1. Incarcerated fat containing anterior abdominal wall hernia.  2. 7 mm nonobstructing right nephrolithiasis.  3. Diverticulosis.  4. Hysterectomy.  5. Small splenic lesions, increased in size from 08/27/2016.         History of presenting illness:  SHELBI VACCARO is a 67 y.o. year old female who presents with complaint of abdominal pain.  Patient was recently seen here in the emergency department at [facility] where she was evaluated for abdominal pain, it was found it was due to abdominal wall hernia that was fat-containing and incarcerated.  Patient was discharged home with follow-up with surgery and appointment has been made and is scheduled for upcoming Tuesday, but since leaving the emergency department her pain is increased to the point that she felt she needed to return today.  Presented today patient was noted to be short of breath placed briefly on BiPAP and given nebulizers with control of shortness of breath.  Patient husband states her breathing is been fine up until he left the house today but in the rush to get out of the house she became acutely short of  breath.  Given Valaria Good patient COPD exacerbation and inability to control pain at home patient was recommended for admission for additional work-up and treatment for abdominal pain and COPD exacerbation  ??  ??        Physical exam: not performed on day of discharge     Discharge condition:  stable    Discharge activity and restrictions: Activity as tolerated    Time spent with discharging patient: [phone]-  total time spent 35 min      Stephanie Coup, MD  02/08/2018  1:04 PM    Dragon medical dictation software was used for portions of this report.  Unintended voice transcription errors may have occurred.

## 2018-02-08 NOTE — Other (Signed)
Verbal shift change report given to Joaquim Namaroline G Schnelle   (oncoming nurse) by Aundra MilletMegan (offgoing nurse). Report included the following information SBAR, Kardex and MAR.

## 2018-02-08 NOTE — Discharge Summary (Signed)
DISCHARGE SUMMARY     Patient Name: [name] [name]  Medical Record Number: [id number]  Date of Birth: Sep 01, 1950  Discharge Provider: Stephanie Coup, MD  Primary Care Provider: Wallene Huh, PA-C    Admit date: 02/04/2018  Discharge date: 02/08/2018  Discharge Disposition: Home  Code Status: Full Code    Follow-up appointments:  Follow-up Information     Follow up With Specialties Details Why Contact Info    Wallene Huh, PA-C Physician Assistant   [place] [address]  [phone]            Follow-up recommendations:  Dr. Eulas Post surgery for evaluation of fat-containing abdominal hernia repair  Primary medical doctor or Dr. Leonia Reader  of pulmonary 1 week    Discharge diagnosis:  Anterior abdominal wall incarcerated fat hernia  Abdominal pain  Acute COPD exacerbation mild  Diabetes type 2 uncontrolled with hyperglycemia  Hypertension  GERD  Chronic pain syndrome  Hepatitis C  ??  -Surgery evaluation for abdominal pain due to hernia, no indication for immediate intervention, this would best done electively when patient recovers from COPD exacerbation and off all steroids.  We will have patient follow-up in short-term with Dr. Eulas Post of surgery to evaluate when this repair will be best performed  -Analgesia as needed, patient discharged with small supply of narcotic pain medication  -Continue  prednisone, home with 5 additional days  -Resume home meds for diabetes  -Resume home meds for hypertension, patient has had elevated diastolic blood pressures in hospital suspect this is due to steroid effect and should improve when steroids discontinued                 Additional diagnoses :   Past Medical History:   Diagnosis Date   ??? Arthritis    ??? 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 COPD (chronic obstructive pulmonary disease) ([place])    ??? DM2 (diabetes mellitus, type 2) ([place])    ??? GERD (gastroesophageal reflux disease)    ??? Hepatitis C     HEP C   ???  HTN (hypertension)    ??? Lung nodule, multiple     (CT 10/2016) Small left upper lobe and 1.7 x 1.6 cm left lower lobe nodules not significantly changed from 07/23/2016 and 03/22/2016   ??? Marijuana use    ??? PTSD (post-traumatic stress disorder)     lived through the [place] in 1993   ??? Thoracic ascending aortic aneurysm ([place])     4.4cm noted on CT Chest (10/2016)   ??? Thyroid nodule     2.5 cm stable right thyroid nodule (CT 10/2016)                                                        Discharge Medications:   Current Discharge Medication List      START taking these medications    Details   predniSONE (DELTASONE) 20 mg tablet Take 40 mg by mouth daily (with breakfast).  Qty: 10 Tab, Refills: 0         CONTINUE these medications which have CHANGED    Details   oxyCODONE-acetaminophen (PERCOCET) 5-325 mg per tablet Take 1 Tab by mouth every six (  6) hours as needed for Pain for up to 7 days. Max Daily Amount: 4 Tabs.  Qty: 30 Tab, Refills: 0    Associated Diagnoses: Abdominal pain, unspecified abdominal location         CONTINUE these medications which have NOT CHANGED    Details   insulin glargine (LANTUS SOLOSTAR U-100 INSULIN) 100 unit/mL (3 mL) inpn 32 Units by SubCUTAneous route nightly.  Qty: 15 mL, Refills: 2    Comments: Appt required for additional refills  Associated Diagnoses: Type 2 diabetes mellitus with diabetic neuropathy, without long-term current use of insulin (HCC)      metFORMIN ER (GLUCOPHAGE XR) 500 mg tablet Take 1 Tab by mouth two (2) times a day.  Qty: 60 Tab, Refills: 2      gabapentin (NEURONTIN) 100 mg capsule TAKE 1 CAPSULE BY MOUTH THREE TIMES DAILY  Qty: 90 Cap, Refills: 2    Associated Diagnoses: Type 2 diabetes mellitus with diabetic neuropathy, without long-term current use of insulin (HCC)      dilTIAZem CD (CARDIZEM CD) 180 mg ER capsule Take 1 Cap by mouth daily.  Qty: 90 Cap, Refills: 1      montelukast (SINGULAIR) 10 mg tablet TAKE 1 TABLET BY MOUTH DAILY  Qty: 90  Tab, Refills: 0    Associated Diagnoses: Mild intermittent asthma, unspecified whether complicated; Chronic rhinitis      spironolactone (ALDACTONE) 100 mg tablet TAKE 1 TABLET BY MOUTH DAILY  Qty: 90 Tab, Refills: 0    Associated Diagnoses: Severe hypertension      ondansetron (ZOFRAN ODT) 4 mg disintegrating tablet Take 1 Tab by mouth every eight (8) hours as needed for Nausea. Indications: nausea  Qty: 12 Tab, Refills: 0      albuterol (ACCUNEB) 1.25 mg/3 mL nebu 3 mL by Nebulization route every six (6) hours as needed (SOB, wheezing). Indications: Asthma Attack  Qty: 100 Each, Refills: 0      nabumetone (RELAFEN) 750 mg tablet Take 750 mg by mouth two (2) times a day.      albuterol-ipratropium (DUO-NEB) 2.5 mg-0.5 mg/3 ml nebu 3 mL by Nebulization route.      tiotropium bromide (SPIRIVA RESPIMAT) 2.5 mcg/actuation inhaler Take 1 Puff by inhalation daily.  Qty: 1 Inhaler, Refills: 0    Associated Diagnoses: COPD with acute exacerbation (HCC)      fluticasone propion-salmeterol (ADVAIR DISKUS) 100-50 mcg/dose diskus inhaler Take 1 Puff by inhalation two (2) times a day.  Qty: 1 Inhaler, Refills: 0      magnesium oxide (MAG-OX) 400 mg tablet TAKE 1 TABLET BY MOUTH DAILY  Qty: 90 Tab, Refills: 0    Associated Diagnoses: Hypomagnesemia      traZODone (DESYREL) 50 mg tablet Take 1 Tab by mouth nightly as needed for Sleep.  Qty: 20 Tab, Refills: 0      QUEtiapine SR (SEROQUEL XR) 150 mg sr tablet TAKE 1 TABLET BY MOUTH NIGHTLY  Qty: 90 Tab, Refills: 1    Associated Diagnoses: Medication refill      rosuvastatin (CRESTOR) 5 mg tablet TAKE 1 TABLET BY MOUTH NIGHTLY  Qty: 90 Tab, Refills: 1    Associated Diagnoses: Type 2 diabetes mellitus with diabetic neuropathy, without long-term current use of insulin (HCC)      polyethylene glycol (MIRALAX) 17 gram packet Take 1 Packet by mouth daily.  Qty: 100 Packet, Refills: 1      triamcinolone (NASACORT) 55 mcg nasal inhaler 2 Sprays by Both Nostrils route daily.  Qty:  1  Bottle, Refills: 0    Associated Diagnoses: Mild intermittent asthma, unspecified whether complicated; Chronic rhinitis      losartan (COZAAR) 100 mg tablet TAKE 1 TABLET BY MOUTH DAILY FOR HYPERTENSION  Qty: 90 Tab, Refills: 0    Associated Diagnoses: Severe hypertension      cloNIDine HCl (CATAPRES) 0.3 mg tablet Take 1 Tab by mouth two (2) times a day. Take bid  Qty: 60 Tab, Refills: 2      Insulin Needles, Disposable, (NANO PEN NEEDLE) 32 gauge x 5/32" ndle To use with Lantus pen  Qty: 100 Pen Needle, Refills: 0      Blood Glucose Control High&Low (ACCU-CHEK AVIVA CONTROL SOLN) soln Check blood sugar daily  Qty: 1 Bottle, Refills: 0      Blood-Glucose Meter (ACCU-CHEK AVIVA PLUS METER) misc Check blood sugar daily  Qty: 1 Each, Refills: 0      !! glucose blood VI test strips (ACCU-CHEK AVIVA PLUS TEST STRP) strip Check blood sugar daily  Qty: 100 Strip, Refills: 5      alcohol swabs (BD SINGLE USE SWABS REGULAR) padm Check blood sugar daily  Qty: 100 Pad, Refills: 5      !! lancets (ACCU-CHEK SOFTCLIX LANCETS) misc Check blood sugar daily  Qty: 100 Each, Refills: 5      Blood-Glucose Meter monitoring kit Check sugars 2 times per day, fasting and 2 hours after dinner  Qty: 1 Kit, Refills: 0    Associated Diagnoses: Diabetes mellitus, new onset ([place])      !! Lancets misc Check sugars twice daily  Qty: 200 Each, Refills: 2    Associated Diagnoses: Diabetes mellitus, new onset ([place])      !! glucose blood VI test strips (ASCENSIA AUTODISC VI, ONE TOUCH ULTRA TEST VI) strip Check sugars twice daily  Qty: 100 Strip, Refills: 5    Associated Diagnoses: Diabetes mellitus, new onset ([place])       !! - Potential duplicate medications found. Please discuss with provider.      STOP taking these medications       nitrofurantoin, macrocrystal-monohydrate, (MACROBID) 100 mg capsule Comments:   Reason for Stopping:               Discharge Diet:  Diet: Diabetic Diet    Consultants: General Surgery    Labs:  Labs: Results:        Chemistry No results for input(s): GLU, NA, K, CL, CO2, BUN, CREA, CA, AGAP, BUCR, TBIL, GPT, AP, TP, ALB, GLOB, AGRAT in the last 72 hours.   CBC w/Diff No results for input(s): WBC, RBC, HGB, HCT, PLT, GRANS, LYMPH, EOS, HGBEXT, HCTEXT, PLTEXT in the last 72 hours.   Cardiac Enzymes No results for input(s): CPK, CKND1, MYO in the last 72 hours.    No lab exists for component: CKRMB, TROIP   Coagulation No results for input(s): PTP, INR, APTT in the last 72 hours.    No lab exists for component: INREXT    Lipid Panel Lab Results   Component Value Date/Time    Cholesterol, total 130 (L) 06/16/2017 06:20 AM    HDL Cholesterol 63 06/16/2017 06:20 AM    LDL, calculated 55 06/16/2017 06:20 AM    VLDL, calculated 31.8 06/10/2016 01:00 PM    Triglyceride 61 06/16/2017 06:20 AM    CHOL/HDL Ratio 2.1 06/16/2017 06:20 AM      BNP No results for input(s): BNPP in the last 72 hours.   Liver Enzymes No results  for input(s): TP, ALB, TBIL, AP, SGOT, GPT in the last 72 hours.    No lab exists for component: DBIL   Thyroid Studies Lab Results   Component Value Date/Time    TSH 0.506 09/08/2017 09:30 AM            Imaging / Diagnostic studies:   CT ABD PELV W CONT (Accession [id number]) (Order [id number])   Allergies??     Low: Chocolate [Cocoa]   Exam Information     Status Exam Begun  Exam Ended    Final [99] 01/29/2018 13:26 01/29/2018 13:40   Result Information     Status: Final result (Exam End: 01/29/2018 13:40) Provider Status: Open   Study Result     DICOM format image data is available to non-affiliated external healthcare  facilities or entities on a secure, media free, reciprocally searchable basis  with patient authorization for 12 months following the date of the study.  ??  Clinical history: Ventral hernia, abdominal pain  ??  EXAMINATION:  CT scan of the abdomen and pelvis 01/29/2018. 5 mm spiral scanning is performed  from the costophrenic angles to the symphysis pubis. Coronal and sagittal  reconstruction imaging has  been obtained.  ??  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 Osteopenia and degenerative changes of the spine.  Right hip prosthesis. There are splenic hypodensities in size from study of  08/27/2016.  ??  Spleen, adrenal glands and bladder are unremarkable. Uterus is absent.  Subcentimeter renal hypodensities too small to characterize. 7 mm nonobstructing  right nephrolithiasis.  ??  There are colonic diverticuli. Colon, terminal ileum, appendix, stomach and  small bowel loops are within normal limits. Abdominal aorta is within normal  limits.  ??  Small to borderline lymph nodes at the porta hepatis. Fat and fluid containing  anterior abdominal wall hernia with surrounding inflammation suggesting  incarceration.  ??  IMPRESSION  IMPRESSION:  1. Incarcerated fat containing anterior abdominal wall hernia.  2. 7 mm nonobstructing right nephrolithiasis.  3. Diverticulosis.  4. Hysterectomy.  5. Small splenic lesions, increased in size from 08/27/2016.         History of presenting illness:  [name] [name] is a 67 y.o. year old female who presents with complaint of abdominal pain.  Patient was recently seen here in the emergency department at [facility] where she was evaluated for abdominal pain, it was found it was due to abdominal wall hernia that was fat-containing and incarcerated.  Patient was discharged home with follow-up with surgery and appointment has been made and is scheduled for upcoming Tuesday, but since leaving the emergency department her pain is increased to the point that she felt she needed to return today.  Presented today patient was noted to be short of breath placed briefly on BiPAP and given nebulizers with control of shortness of breath.  Patient husband states her breathing is been fine up until he left the house today but in the rush to get out of the house she became acutely short of breath.  Given Valaria Good patient COPD exacerbation and inability to control pain at home  patient was recommended for admission for additional work-up and treatment for abdominal pain and COPD exacerbation  ??  ??        Physical exam: not performed on day of discharge     Discharge condition:  stable    Discharge activity and restrictions: Activity as tolerated    Time spent with discharging patient: [phone]-  total time spent 35 min      Stephanie Coup, MD  02/08/2018  1:04 PM    Dragon medical dictation software was used for portions of this report.  Unintended voice transcription errors may have occurred.

## 2018-02-12 ENCOUNTER — Encounter

## 2018-02-12 NOTE — Telephone Encounter (Signed)
Referral sent to [facility]- please schedule with Spine specialist

## 2018-02-14 ENCOUNTER — Ambulatory Visit: Attending: Physician Assistant | Primary: Physician Assistant

## 2018-02-14 ENCOUNTER — Ambulatory Visit
Admit: 2018-02-14 | Discharge: 2018-02-14 | Payer: MEDICARE | Attending: Physician Assistant | Primary: Physician Assistant

## 2018-02-14 DIAGNOSIS — J441 Chronic obstructive pulmonary disease with (acute) exacerbation: Secondary | ICD-10-CM

## 2018-02-14 NOTE — Patient Instructions (Signed)
Low Blood Pressure: Care Instructions  Your Care Instructions    Blood pressure is a measurement of the force of the blood against the walls of the blood vessels during and after each beat of the heart. Low blood pressure is also called hypotension. It means that your blood pressure is much lower than normal. Some people, especially young, slim women, may have slightly low blood pressure without symptoms. But in many people, low blood pressure can cause symptoms such as feeling dizzy or lightheaded. When your blood pressure is too low, your heart, brain, and other organs do not get enough blood.  Low blood pressure can be caused by many things, including heart problems and some medicines. Diabetes that is not under control can cause your blood pressure to drop. And so can a severe allergic reaction or infection. Another cause is dehydration, which is when your body loses too much fluid.  Treatment for low blood pressure depends on the cause.  Follow-up care is a key part of your treatment and safety. Be sure to make and go to all appointments, and call your doctor if you are having problems. It's also a good idea to know your test results and keep a list of the medicines you take.  How can you care for yourself at home?  ?? Drink plenty of fluids, enough so that your urine is light yellow or clear like water. If you have kidney, heart, or liver disease and have to limit fluids, talk with your doctor before you increase the amount of fluids you drink.  ?? Be safe with medicines. Call your doctor if you think you are having a problem with your medicine. You will get more details on the specific medicines your doctor prescribes.  ?? Stand up or get out of bed very slowly to allow your body to adjust.  ?? Get plenty of rest.  ?? Do not smoke. Smoking increases your risk of heart attack. If you need help quitting, talk to your doctor about stop-smoking programs and  medicines. These can increase your chances of quitting for good.  ?? Limit alcohol to 2 drinks a day for men and 1 drink a day for women. Alcohol may interfere with your medicine. In addition, alcohol can make your low blood pressure worse by causing your body to lose water.  When should you call for help?  Call 911 anytime you think you may need emergency care. For example, call if:  ?? ?? You passed out (lost consciousness).   ??Call your doctor now or seek immediate medical care if:  ?? ?? You are dizzy or lightheaded, or you feel like you may faint.   ??Watch closely for changes in your health, and be sure to contact your doctor if you have any problems.  Where can you learn more?  Go to http://www.healthwise.net/[employer].  Enter C304 in the search box to learn more about "Low Blood Pressure: Care Instructions."  Current as of: January 01, 2017  Content Version: 12.1  ?? 2006-2019 [employer]. Care instructions adapted under license by [employer] (which disclaims liability or warranty for this information). If you have questions about a medical condition or this instruction, always ask your healthcare professional. [employer] disclaims any warranty or liability for your use of this information.

## 2018-02-14 NOTE — Progress Notes (Signed)
[name] [name] is a 67 y.o. female here today for a hospital follow up. She has a hernia that has grown in size.

## 2018-02-14 NOTE — Progress Notes (Signed)
HISTORY OF PRESENT ILLNESS  [name] [name] is a 67 y.o. female.  HPI  [name] [name] is a 67 y.o. female who presents to the office today for hospital follow up.  She comes in today with her husband for hospital follow up.   She was admitted to [facility] from 02/04/2018 and discharged on 02/08/2018 for acute COPD exacerbation and anterior abdominal wall incarcerated fat hernia.  This abdominal wall hernia was diagnosed on a previous ED visit on 01/29/2018.  She had an appointment to follow-up with general surgery.  She returned to the ER because her pain had worsened and she was experiencing shortness of breath.  Her COPD exacerbation improved with BiPAP and nebulizers.  She was also treated with prednisone.  General surgery was consulted for her abdominal pain due to the hernia.  It was decided there is no indication for immediate intervention and they want her to follow-up in the office after she recovers from her COPD exacerbation and is off all steroids.  She comes in today for follow-up.  She really saw Dr. Lavone Neri last Friday and he states her COPD is stable and he will follow-up with her in 6 months.  She is an appointment scheduled with Dr. Eulas Post for evaluation of her incarcerated fat-containing hernia.  At discharge they sent her home with Percocet.  She is requesting a refill at today's visit.  Review of her records show that she also received Percocet at her ED visit on 01/29/2018.       Chief Complaint   Patient presents with   ??? Hospital Follow Up     Current Outpatient Medications on File Prior to Visit   Medication Sig Dispense Refill   ??? oxyCODONE-acetaminophen (PERCOCET) 5-325 mg per tablet Take 1 Tab by mouth every six (6) hours as needed for Pain for up to 7 days. Max Daily Amount: 4 Tabs. 30 Tab 0   ??? cloNIDine HCl (CATAPRES) 0.3 mg tablet Take 1 Tab by mouth two (2) times a day. Take bid 60 Tab 2   ??? losartan (COZAAR) 100 mg tablet TAKE 1 TABLET BY MOUTH DAILY FOR  HYPERTENSION 30 Tab 2   ??? predniSONE (DELTASONE) 20 mg tablet Take 40 mg by mouth daily (with breakfast). 10 Tab 0   ??? insulin glargine (LANTUS SOLOSTAR U-100 INSULIN) 100 unit/mL (3 mL) inpn 32 Units by SubCUTAneous route nightly. 15 mL 2   ??? metFORMIN ER (GLUCOPHAGE XR) 500 mg tablet Take 1 Tab by mouth two (2) times a day. 60 Tab 2   ??? gabapentin (NEURONTIN) 100 mg capsule TAKE 1 CAPSULE BY MOUTH THREE TIMES DAILY 90 Cap 2   ??? Insulin Needles, Disposable, (NANO PEN NEEDLE) 32 gauge x 5/32" ndle To use with Lantus pen 100 Pen Needle 0   ??? dilTIAZem CD (CARDIZEM CD) 180 mg ER capsule Take 1 Cap by mouth daily. 90 Cap 1   ??? montelukast (SINGULAIR) 10 mg tablet TAKE 1 TABLET BY MOUTH DAILY 90 Tab 0   ??? spironolactone (ALDACTONE) 100 mg tablet TAKE 1 TABLET BY MOUTH DAILY 90 Tab 0   ??? ondansetron (ZOFRAN ODT) 4 mg disintegrating tablet Take 1 Tab by mouth every eight (8) hours as needed for Nausea. Indications: nausea 12 Tab 0   ??? albuterol (ACCUNEB) 1.25 mg/3 mL nebu 3 mL by Nebulization route every six (6) hours as needed (SOB, wheezing). Indications: Asthma Attack 100 Each 0   ??? albuterol-ipratropium (DUO-NEB) 2.5 mg-0.5 mg/3 ml  nebu 3 mL by Nebulization route.     ??? Blood Glucose Control High&Low (ACCU-CHEK AVIVA CONTROL SOLN) soln Check blood sugar daily 1 Bottle 0   ??? Blood-Glucose Meter (ACCU-CHEK AVIVA PLUS METER) misc Check blood sugar daily 1 Each 0   ??? glucose blood VI test strips (ACCU-CHEK AVIVA PLUS TEST STRP) strip Check blood sugar daily 100 Strip 5   ??? alcohol swabs (BD SINGLE USE SWABS REGULAR) padm Check blood sugar daily 100 Pad 5   ??? lancets (ACCU-CHEK SOFTCLIX LANCETS) misc Check blood sugar daily 100 Each 5   ??? tiotropium bromide (SPIRIVA RESPIMAT) 2.5 mcg/actuation inhaler Take 1 Puff by inhalation daily. 1 Inhaler 0   ??? fluticasone propion-salmeterol (ADVAIR DISKUS) 100-50 mcg/dose diskus inhaler Take 1 Puff by inhalation two (2) times a day. 1 Inhaler 0    ??? magnesium oxide (MAG-OX) 400 mg tablet TAKE 1 TABLET BY MOUTH DAILY 90 Tab 0   ??? traZODone (DESYREL) 50 mg tablet Take 1 Tab by mouth nightly as needed for Sleep. 20 Tab 0   ??? QUEtiapine SR (SEROQUEL XR) 150 mg sr tablet TAKE 1 TABLET BY MOUTH NIGHTLY 90 Tab 1   ??? rosuvastatin (CRESTOR) 5 mg tablet TAKE 1 TABLET BY MOUTH NIGHTLY 90 Tab 1   ??? polyethylene glycol (MIRALAX) 17 gram packet Take 1 Packet by mouth daily. 100 Packet 1   ??? triamcinolone (NASACORT) 55 mcg nasal inhaler 2 Sprays by Both Nostrils route daily. 1 Bottle 0   ??? Blood-Glucose Meter monitoring kit Check sugars 2 times per day, fasting and 2 hours after dinner 1 Kit 0   ??? Lancets misc Check sugars twice daily 200 Each 2   ??? glucose blood VI test strips (ASCENSIA AUTODISC VI, ONE TOUCH ULTRA TEST VI) strip Check sugars twice daily 100 Strip 5   ??? nabumetone (RELAFEN) 750 mg tablet Take 750 mg by mouth two (2) times a day.       No current facility-administered medications on file prior to visit.      Allergies   Allergen Reactions   ??? Chocolate [Cocoa] Sneezing     Past Medical History:   Diagnosis Date   ??? Arthritis    ??? 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 COPD (chronic obstructive pulmonary disease) ([place])    ??? DM2 (diabetes mellitus, type 2) ([place])    ??? GERD (gastroesophageal reflux disease)    ??? Hepatitis C     HEP C   ??? HTN (hypertension)    ??? Lung nodule, multiple     (CT 10/2016) Small left upper lobe and 1.7 x 1.6 cm left lower lobe nodules not significantly changed from 07/23/2016 and 03/22/2016   ??? Marijuana use    ??? PTSD (post-traumatic stress disorder)     lived through the [place] in 1993   ??? Thoracic ascending aortic aneurysm ([place])     4.4cm noted on CT Chest (10/2016)   ??? Thyroid nodule     2.5 cm stable right thyroid nodule (CT 10/2016)     Social History     Tobacco Use   Smoking Status Former Smoker   Smokeless Tobacco Never Used   Tobacco Comment     reported as quit smoking around 1990s per spouse     Social History     Substance and Sexual Activity   Alcohol Use No     Family History   Problem Relation  Age of Onset   ??? Hypertension Mother    ??? Other Mother         OSA   ??? Cancer Father         suspected leukemia per pt's spouse   ??? Cancer Maternal Aunt    ??? Alcohol abuse Maternal Grandfather      Review of Systems   Constitutional: Negative for chills, diaphoresis and fever.   Respiratory: Negative for sputum production, shortness of breath and wheezing.    Cardiovascular: Negative for chest pain and palpitations.   Gastrointestinal: Positive for abdominal pain. Negative for blood in stool, constipation, melena, nausea and vomiting.     Visit Vitals  BP (!) 85/64 (BP 1 Location: Right arm, BP Patient Position: Sitting)   Pulse 82   Temp 97.9 ??F (36.6 ??C) (Oral)   Resp 18   Ht 5' 10" (1.778 m)   Wt 186 lb (84.4 kg)   SpO2 96%   BMI 26.69 kg/m??     Physical Exam   Constitutional: She is oriented to person, place, and time. She appears well-developed and well-nourished. No distress.   Cardiovascular: Normal rate, regular rhythm and normal heart sounds.   Pulmonary/Chest: Effort normal and breath sounds normal. No respiratory distress. She has no wheezes. She has no rales.   Abdominal: Normal appearance. There is generalized tenderness. A hernia is present.       Musculoskeletal: She exhibits no edema.   Neurological: She is alert and oriented to person, place, and time.   Skin: Skin is warm and dry.   Psychiatric: She has a normal mood and affect. Her behavior is normal. Thought content normal.   Nursing note and vitals reviewed.      ASSESSMENT and PLAN    ICD-10-CM ICD-9-CM    1. COPD exacerbation ([place]) J44.1 491.21    2. Abdominal pain, unspecified abdominal location R10.9 789.00    3. Hernia of abdominal wall K43.9 553.20    4. Hypotension, unspecified hypotension type I95.9 458.9       COPD exacerbation resolved. She follows up with Dr. Leonia Reader and per patient  is stable and does not need to return for 6 months.   Already has appt scheduled next Tuesday with the general surgeon. She was prescribed #30 percocet on 02/08/18 and requesting a refill today- denied due to refill too soon. I do not feel comfortable continuing pain medication with BP being so low.   If her systolic BP at home does not go to 100 or greater, I would like for her to cut losartan in half. She is asymptomatic, but reports being tired.   Reviewed medication and side effects. Patient agrees with the plan and verbalizes understanding.   Follow-up and Dispositions    ?? Return in about 2 weeks (around 02/28/2018) for blood pressure.       Wallene Huh, PA-C  02/14/2018    PLEASE NOTE:  This document has been produced using voice recognition software. Unrecognized errors in transcription may be present.

## 2018-02-14 NOTE — Progress Notes (Signed)
Progress Notes by Wallene Huh, PA-C at 02/14/18 1330                Author: Wallene Huh, PA-C  Service: --  Author Type: Physician Assistant       Filed: 02/14/18 1740  Encounter Date: 02/14/2018  Status: Signed          Editor: Wallene Huh, PA-C (Physician Assistant)               [name] [name] is a 67 y.o.  female.   HPI   [name] [name] is a 67 y.o. female who presents to the office today for hospital follow up.   She comes in today with her husband for hospital follow up.    She was admitted to [facility] from 02/04/2018 and discharged on 02/08/2018 for acute COPD exacerbation and anterior abdominal wall incarcerated fat hernia.  This abdominal wall hernia was diagnosed on a previous ED visit on 01/29/2018.   She had an appointment to follow-up with general surgery.  She returned to the ER because her pain had worsened and she was experiencing shortness of breath.  Her COPD exacerbation improved with BiPAP and nebulizers.  She was also treated with prednisone.   General surgery was consulted for her abdominal pain due to the hernia.  It was decided there is no indication for immediate intervention and they want her to follow-up in the office after she recovers from her COPD exacerbation and is off all steroids.   She comes in today for follow-up.  She really saw Dr. Lavone Neri last Friday and he states her COPD is stable and he will follow-up with her in 6 months.  She is an appointment scheduled with Dr. Eulas Post for evaluation of her incarcerated fat-containing hernia.   At discharge they sent her home with Percocet.  She is requesting a refill at today's visit.  Review of her records show that she also received Percocet at her ED visit on 01/29/2018.            Chief Complaint       Patient presents with        ?  Hospital Follow Up          Current Outpatient Medications on File Prior to Visit          Medication  Sig  Dispense  Refill           ?   oxyCODONE-acetaminophen (PERCOCET) 5-325 mg per tablet  Take 1 Tab by mouth every six (6) hours as needed for Pain for up to 7 days. Max Daily Amount: 4 Tabs.  30 Tab  0     ?  cloNIDine HCl (CATAPRES) 0.3 mg tablet  Take 1 Tab by mouth two (2) times a day. Take bid  60 Tab  2     ?  losartan (COZAAR) 100 mg tablet  TAKE 1 TABLET BY MOUTH DAILY FOR HYPERTENSION  30 Tab  2     ?  predniSONE (DELTASONE) 20 mg tablet  Take 40 mg by mouth daily (with breakfast).  10 Tab  0     ?  insulin glargine (LANTUS SOLOSTAR U-100 INSULIN) 100 unit/mL (3 mL) inpn  32 Units by SubCUTAneous route nightly.  15 mL  2     ?  metFORMIN ER (GLUCOPHAGE XR) 500 mg tablet  Take 1 Tab by mouth two (2) times a day.  60 Tab  2     ?  gabapentin (NEURONTIN) 100 mg capsule  TAKE 1 CAPSULE BY MOUTH THREE TIMES DAILY  90 Cap  2     ?  Insulin Needles, Disposable, (NANO PEN NEEDLE) 32 gauge x 5/32" ndle  To use with Lantus pen  100 Pen Needle  0     ?  dilTIAZem CD (CARDIZEM CD) 180 mg ER capsule  Take 1 Cap by mouth daily.  90 Cap  1           ?  montelukast (SINGULAIR) 10 mg tablet  TAKE 1 TABLET BY MOUTH DAILY  90 Tab  0           ?  spironolactone (ALDACTONE) 100 mg tablet  TAKE 1 TABLET BY MOUTH DAILY  90 Tab  0     ?  ondansetron (ZOFRAN ODT) 4 mg disintegrating tablet  Take 1 Tab by mouth every eight (8) hours as needed for Nausea. Indications: nausea  12 Tab  0     ?  albuterol (ACCUNEB) 1.25 mg/3 mL nebu  3 mL by Nebulization route every six (6) hours as needed (SOB, wheezing). Indications: Asthma Attack  100 Each  0     ?  albuterol-ipratropium (DUO-NEB) 2.5 mg-0.5 mg/3 ml nebu  3 mL by Nebulization route.         ?  Blood Glucose Control High&Low (ACCU-CHEK AVIVA CONTROL SOLN) soln  Check blood sugar daily  1 Bottle  0     ?  Blood-Glucose Meter (ACCU-CHEK AVIVA PLUS METER) misc  Check blood sugar daily  1 Each  0     ?  glucose blood VI test strips (ACCU-CHEK AVIVA PLUS TEST STRP) strip  Check blood sugar daily  100 Strip  5     ?   alcohol swabs (BD SINGLE USE SWABS REGULAR) padm  Check blood sugar daily  100 Pad  5     ?  lancets (ACCU-CHEK SOFTCLIX LANCETS) misc  Check blood sugar daily  100 Each  5     ?  tiotropium bromide (SPIRIVA RESPIMAT) 2.5 mcg/actuation inhaler  Take 1 Puff by inhalation daily.  1 Inhaler  0     ?  fluticasone propion-salmeterol (ADVAIR DISKUS) 100-50 mcg/dose diskus inhaler  Take 1 Puff by inhalation two (2) times a day.  1 Inhaler  0     ?  magnesium oxide (MAG-OX) 400 mg tablet  TAKE 1 TABLET BY MOUTH DAILY  90 Tab  0     ?  traZODone (DESYREL) 50 mg tablet  Take 1 Tab by mouth nightly as needed for Sleep.  20 Tab  0     ?  QUEtiapine SR (SEROQUEL XR) 150 mg sr tablet  TAKE 1 TABLET BY MOUTH NIGHTLY  90 Tab  1     ?  rosuvastatin (CRESTOR) 5 mg tablet  TAKE 1 TABLET BY MOUTH NIGHTLY  90 Tab  1     ?  polyethylene glycol (MIRALAX) 17 gram packet  Take 1 Packet by mouth daily.  100 Packet  1     ?  triamcinolone (NASACORT) 55 mcg nasal inhaler  2 Sprays by Both Nostrils route daily.  1 Bottle  0     ?  Blood-Glucose Meter monitoring kit  Check sugars 2 times per day, fasting and 2 hours after dinner  1 Kit  0     ?  Lancets misc  Check sugars twice daily  200 Each  2     ?  glucose blood VI test strips (ASCENSIA AUTODISC VI, ONE TOUCH ULTRA TEST VI) strip  Check sugars twice daily  100 Strip  5           ?  nabumetone (RELAFEN) 750 mg tablet  Take 750 mg by mouth two (2) times a day.              No current facility-administered medications on file prior to visit.           Allergies        Allergen  Reactions         ?  Chocolate [Cocoa]  Sneezing          Past Medical History:        Diagnosis  Date         ?  Arthritis       ?  Chronic pain syndrome            related to R hip replacement         ?  COPD (chronic obstructive pulmonary disease) ([place])       ?  DM2 (diabetes mellitus, type 2) ([place])       ?  GERD (gastroesophageal reflux disease)       ?  Hepatitis C            HEP C         ?  HTN (hypertension)        ?  Lung nodule, multiple            (CT 10/2016) Small left upper lobe and 1.7 x 1.6 cm left lower lobe nodules not significantly changed from 07/23/2016 and 03/22/2016         ?  Marijuana use       ?  PTSD (post-traumatic stress disorder)            lived through the [place] in 1993         ?  Thoracic ascending aortic aneurysm ([place])            4.4cm noted on CT Chest (10/2016)         ?  Thyroid nodule            2.5 cm stable right thyroid nodule (CT 10/2016)          Social History          Tobacco Use        Smoking Status  Former Smoker     Smokeless Tobacco  Never Used       Tobacco Comment          reported as quit smoking around 1990s per spouse          Social History          Substance and Sexual Activity        Alcohol Use  No          Family History         Problem  Relation  Age of Onset          ?  Hypertension  Mother       ?  Other  Mother                OSA          ?  Cancer  Father  suspected leukemia per pt's spouse          ?  Cancer  Maternal Aunt            ?  Alcohol abuse  Maternal Grandfather          Review of Systems    Constitutional: Negative for chills, diaphoresis and fever.    Respiratory: Negative for sputum production, shortness of breath and wheezing.     Cardiovascular: Negative for chest pain and palpitations.    Gastrointestinal: Positive for abdominal pain. Negative for blood in stool, constipation, melena, nausea and vomiting.       Visit Vitals      BP  (!) 85/64 (BP 1 Location: Right arm, BP Patient Position: Sitting)        Pulse  82     Temp  97.9 ??F (36.6 ??C) (Oral)     Resp  18     Ht  '5\' 10"'  (1.778 m)     Wt  186 lb (84.4 kg)     SpO2  96%        BMI  26.69 kg/m??        Physical Exam    Constitutional: She is oriented to person, place, and time. She appears well-developed and well-nourished. No distress.    Cardiovascular: Normal rate, regular rhythm and normal heart sounds.    Pulmonary/Chest: Effort normal and breath sounds normal.  No respiratory distress. She has no wheezes. She has no rales.    Abdominal: Normal appearance. There is generalized tenderness. A hernia  is present.         Musculoskeletal: She exhibits no edema.   Neurological: She is alert and oriented  to person, place, and time.    Skin: Skin is warm and dry.   Psychiatric: She has a normal mood and affect. Her behavior is normal. Thought content normal.     Nursing note and vitals reviewed.         ASSESSMENT and PLAN             ICD-10-CM  ICD-9-CM             1.  COPD exacerbation ([place])  J44.1  491.21       2.  Abdominal pain, unspecified abdominal location  R10.9  789.00       3.  Hernia of abdominal wall  K43.9  553.20             4.  Hypotension, unspecified hypotension type  I95.9  458.9           COPD exacerbation resolved. She follows up with Dr. Leonia Reader and per patient is stable and does not need to return for 6 months.    Already has appt scheduled next Tuesday with the general surgeon. She was prescribed #30 percocet on 02/08/18 and requesting a refill today- denied due to refill too soon. I do not feel comfortable continuing pain medication with BP being so low.    If her systolic BP at home does not go to 100 or greater, I would like for her to cut losartan in half. She is asymptomatic, but reports being tired.    Reviewed medication and side effects. Patient agrees with the plan and verbalizes understanding.      Follow-up and Dispositions      ??  Return in about 2 weeks (around 02/28/2018) for blood pressure.             Wallene Huh,  PA-C   02/14/2018      PLEASE NOTE:  This document has been produced using voice recognition software.  Unrecognized errors in transcription may be present.

## 2018-02-14 NOTE — Progress Notes (Signed)
[name] [name] is a 67 y.o. female here today for a hospital follow up. She has a hernia that has grown in size.

## 2018-02-18 ENCOUNTER — Emergency Department: Admit: 2018-02-18 | Payer: MEDICARE | Primary: Physician Assistant

## 2018-02-18 ENCOUNTER — Inpatient Hospital Stay
Admit: 2018-02-18 | Discharge: 2018-02-24 | Disposition: A | Discharge status: Home / Self Care | Payer: MEDICARE | Attending: Internal Medicine | Admitting: Internal Medicine

## 2018-02-18 DIAGNOSIS — J441 Chronic obstructive pulmonary disease with (acute) exacerbation: Principal | ICD-10-CM

## 2018-02-18 LAB — CBC WITH AUTO DIFFERENTIAL
Basophils %: 0.1 % (ref 0–3)
Eosinophils %: 0.1 % (ref 0–5)
Hematocrit: 44.7 % (ref 37.0–50.0)
Hemoglobin: 14.7 gm/dl (ref 13.0–17.2)
Immature Granulocytes: 0.5 % (ref 0.0–3.0)
Lymphocytes %: 5.8 % — ABNORMAL LOW (ref 28–48)
MCH: 29.1 pg (ref 25.4–34.6)
MCHC: 32.9 gm/dl (ref 30.0–36.0)
MCV: 88.5 fL (ref 80.0–98.0)
MPV: 11.1 fL — ABNORMAL HIGH (ref 6.0–10.0)
Monocytes %: 2.2 % (ref 1–13)
Neutrophils %: 91.3 % — ABNORMAL HIGH (ref 34–64)
Nucleated RBCs: 0 (ref 0–0)
Platelets: 336 10*3/uL (ref 140–450)
RBC: 5.05 M/uL (ref 3.60–5.20)
RDW-SD: 45.4 (ref 36.4–46.3)
WBC: 18.9 10*3/uL — ABNORMAL HIGH (ref 4.0–11.0)

## 2018-02-18 LAB — POC BLOOD GAS + LACTIC ACID
BASE EXCESS: -2 mmol/L (ref <=3)
BICARBONATE: 22.5 mmol/L (ref 18.0–26.0)
Base Excess: -2 mmol/L (ref <=3)
CO2 Total: 24 mmol/L (ref 24–29)
CO2, TOTAL: 24 mmol/L (ref 24–29)
EPAP/CPAP/PEEP: 8
EPAP/CPAP/PEEP: 8
FIO2: 40
FIO2: 40
HCO3: 22.5 mmol/L (ref 18.0–26.0)
IPAP/PIP: 18
IPAP/PIP: 18
LACTIC ACID: 5.4 mmol/L (ref 0.40–2.00)
Lactic Acid: 5.4 mmol/L — CR (ref 0.40–2.00)
O2 SAT: 100 % (ref 90–100)
O2 Sat: 100 % (ref 90–100)
PCO2: 36.8 mm Hg (ref 35.0–45.0)
PCO2: 36.8 mm Hg (ref 35.0–45.0)
PO2: 174 mm Hg — ABNORMAL HIGH (ref 75–100)
PO2: 174 mm Hg — ABNORMAL HIGH (ref 75–100)
Patient temp.: 98
Patient temperature: 98
RESPIRATORY RATE: 12
Respiratory Rate: 12
SET RATE: 10
Set Rate: 10
VTEXP, VTEXP: 850
VTEXP: 850
pH: 7.393 (ref 7.350–7.450)
pH: 7.393 (ref 7.350–7.450)

## 2018-02-18 LAB — PROBNP, N-TERMINAL: BNP: 7 pg/ml (ref 0.0–125.0)

## 2018-02-18 LAB — COMPREHENSIVE METABOLIC PANEL
ALT: 20 U/L (ref 12–78)
AST: 5 U/L — ABNORMAL LOW (ref 15–37)
Albumin: 3.7 gm/dl (ref 3.4–5.0)
Alkaline Phosphatase: 58 U/L (ref 45–117)
Anion Gap: 10 mmol/L (ref 5–15)
BUN: 31 mg/dl — ABNORMAL HIGH (ref 7–25)
CO2: 24 mEq/L (ref 21–32)
Calcium: 9.5 mg/dl (ref 8.5–10.1)
Chloride: 102 mEq/L (ref 98–107)
Creatinine: 1.3 mg/dl (ref 0.6–1.3)
EGFR IF NonAfrican American: 44
GFR African American: 53
Glucose: 178 mg/dl — ABNORMAL HIGH (ref 74–106)
Potassium: 4.7 mEq/L (ref 3.5–5.1)
Sodium: 136 mEq/L (ref 136–145)
Total Bilirubin: 0.5 mg/dl (ref 0.2–1.0)
Total Protein: 8.3 gm/dl — ABNORMAL HIGH (ref 6.4–8.2)

## 2018-02-18 LAB — TROPONIN: Troponin I: <0.015 ng/ml (ref 0.000–0.045)

## 2018-02-18 LAB — LACTIC ACID
LACTIC ACID: 3.9 mmol/L — ABNORMAL HIGH (ref 0.4–2.0)
Lactic Acid: 3.9 mmol/L — ABNORMAL HIGH (ref 0.4–2.0)

## 2018-02-18 LAB — D-DIMER, QUANTITATIVE: D-Dimer, Quant: 0.9 ug/mL (FEU) — ABNORMAL HIGH (ref 0.01–0.50)

## 2018-02-18 LAB — NT-PRO BNP: NT pro-BNP: 7 pg/ml (ref 0.0–125.0)

## 2018-02-18 LAB — METABOLIC PANEL, COMPREHENSIVE
ALT (SGPT): 20 U/L (ref 12–78)
AST (SGOT): 5 U/L — ABNORMAL LOW (ref 15–37)
Albumin: 3.7 gm/dl (ref 3.4–5.0)
Alk. phosphatase: 58 U/L (ref 45–117)
Anion gap: 10 mmol/L (ref 5–15)
BUN: 31 mg/dl — ABNORMAL HIGH (ref 7–25)
Bilirubin, total: 0.5 mg/dl (ref 0.2–1.0)
CO2: 24 mEq/L (ref 21–32)
Calcium: 9.5 mg/dl (ref 8.5–10.1)
Chloride: 102 mEq/L (ref 98–107)
Creatinine: 1.3 mg/dl (ref 0.6–1.3)
GFR est AA: 53
GFR est non-AA: 44
Glucose: 178 mg/dl — ABNORMAL HIGH (ref 74–106)
Potassium: 4.7 mEq/L (ref 3.5–5.1)
Protein, total: 8.3 gm/dl — ABNORMAL HIGH (ref 6.4–8.2)
Sodium: 136 mEq/L (ref 136–145)

## 2018-02-18 LAB — CBC WITH AUTOMATED DIFF
BASOPHILS: 0.1 % (ref 0–3)
EOSINOPHILS: 0.1 % (ref 0–5)
HCT: 44.7 % (ref 37.0–50.0)
HGB: 14.7 gm/dl (ref 13.0–17.2)
IMMATURE GRANULOCYTES: 0.5 % (ref 0.0–3.0)
LYMPHOCYTES: 5.8 % — ABNORMAL LOW (ref 28–48)
MCH: 29.1 pg (ref 25.4–34.6)
MCHC: 32.9 gm/dl (ref 30.0–36.0)
MCV: 88.5 fL (ref 80.0–98.0)
MONOCYTES: 2.2 % (ref 1–13)
MPV: 11.1 fL — ABNORMAL HIGH (ref 6.0–10.0)
NEUTROPHILS: 91.3 % — ABNORMAL HIGH (ref 34–64)
NRBC: 0 (ref 0–0)
PLATELET: 336 10*3/uL (ref 140–450)
RBC: 5.05 M/uL (ref 3.60–5.20)
RDW-SD: 45.4 (ref 36.4–46.3)
WBC: 18.9 10*3/uL — ABNORMAL HIGH (ref 4.0–11.0)

## 2018-02-18 LAB — TROPONIN I: Troponin-I: <0.015 ng/ml (ref 0.000–0.045)

## 2018-02-18 LAB — D DIMER: D DIMER: 0.9 ug/mL (FEU) — ABNORMAL HIGH (ref 0.01–0.50)

## 2018-02-18 MED ORDER — SODIUM CHLORIDE 0.9 % IJ SYRG
Freq: Once | INTRAMUSCULAR | Status: AC
Start: 2018-02-18 — End: 2018-02-18
  Administered 2018-02-19: 03:00:00 via INTRAVENOUS

## 2018-02-18 MED ORDER — SODIUM CHLORIDE 0.9% BOLUS IV
0.9 % | INTRAVENOUS | Status: AC
Start: 2018-02-18 — End: 2018-02-18
  Administered 2018-02-18: via INTRAVENOUS

## 2018-02-18 MED ORDER — ALBUTEROL SULFATE 0.083 % (0.83 MG/ML) SOLN FOR INHALATION
2.5 mg /3 mL (0.083 %) | RESPIRATORY_TRACT | Status: AC
Start: 2018-02-18 — End: 2018-02-18
  Administered 2018-02-18: via RESPIRATORY_TRACT

## 2018-02-18 MED ORDER — IPRATROPIUM-ALBUTEROL 2.5 MG-0.5 MG/3 ML NEB SOLUTION
2.5 mg-0.5 mg/3 ml | RESPIRATORY_TRACT | Status: AC
Start: 2018-02-18 — End: 2018-02-18
  Administered 2018-02-18: 21:00:00 via RESPIRATORY_TRACT

## 2018-02-18 MED ORDER — IOPAMIDOL 76 % IV SOLN
370 mg iodine /mL (76 %) | Freq: Once | INTRAVENOUS | Status: AC
Start: 2018-02-18 — End: 2018-02-18
  Administered 2018-02-19: 03:00:00 via INTRAVENOUS

## 2018-02-18 MED ORDER — METHYLPREDNISOLONE (PF) 125 MG/2 ML IJ SOLR
125 mg/2 mL | Freq: Once | INTRAMUSCULAR | Status: AC
Start: 2018-02-18 — End: 2018-02-18
  Administered 2018-02-18: 22:00:00 via INTRAVENOUS

## 2018-02-18 MED FILL — ALBUTEROL SULFATE 0.083 % (0.83 MG/ML) SOLN FOR INHALATION: 2.5 mg /3 mL (0.083 %) | RESPIRATORY_TRACT | Qty: 1

## 2018-02-18 MED FILL — SOLU-MEDROL (PF) 125 MG/2 ML SOLUTION FOR INJECTION: 125 mg/2 mL | INTRAMUSCULAR | Qty: 2

## 2018-02-18 MED FILL — IPRATROPIUM-ALBUTEROL 2.5 MG-0.5 MG/3 ML NEB SOLUTION: 2.5 mg-0.5 mg/3 ml | RESPIRATORY_TRACT | Qty: 3

## 2018-02-18 MED FILL — ISOVUE-370  76 % INTRAVENOUS SOLUTION: 370 mg iodine /mL (76 %) | INTRAVENOUS | Qty: 90

## 2018-02-18 NOTE — ED Provider Notes (Signed)
[facility]  Emergency Department Treatment Report    Patient: [name] [name] Age: 67 y.o. Sex: female    Date of Birth: [birth year] Admit Date: 02/18/2018 PCP: Ernst Breach   MRN: [id number]  CSN: [id number]     Room: I412/I412 Time Dictated: 8:16 PM        Chief Complaint   Dyspnea  History of Present Illness   67 y.o. female with a history of COPD.  Presents complaint of acute onset of shortness of breath.  Patient's husband states that she is been having difficulty breathing for the past 24 hours.  Due to severe dyspnea, patient is unable to provide any history herself.    She was recently admitted, due to an incarcerated ventral hernia.  Surgical consult at that time did not feel that this needed to be emergently repaired.  And felt they should try to optimize her COPD, prior to attempting surgery.    Review of Systems     Unable to perform an adequate HPI and ROS within the constraints imposed by the urgency in the patient's clinical condition.    Past Medical/Surgical History     Past Medical History:   Diagnosis Date   ??? Arthritis    ??? 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 COPD (chronic obstructive pulmonary disease) ([place])    ??? DM2 (diabetes mellitus, type 2) ([place])    ??? GERD (gastroesophageal reflux disease)    ??? Hepatitis C     HEP C   ??? HTN (hypertension)    ??? Lung nodule, multiple     (CT 10/2016) Small left upper lobe and 1.7 x 1.6 cm left lower lobe nodules not significantly changed from 07/23/2016 and 03/22/2016   ??? Marijuana use    ??? PTSD (post-traumatic stress disorder)     lived through the [place] in 1993   ??? Thoracic ascending aortic aneurysm ([place])     4.4cm noted on CT Chest (10/2016)   ??? Thyroid nodule     2.5 cm stable right thyroid nodule (CT 10/2016)     Past Surgical History:   Procedure Laterality Date   ??? HX ENDOSCOPY     ??? HX HIP REPLACEMENT Right 01/29/2015   ??? HX HIP REPLACEMENT     ??? HX HYSTERECTOMY  2010    hysterectomy        Social History     Social History     Socioeconomic History   ??? Marital status: MARRIED     Spouse name: Not on file   ??? Number of children: Not on file   ??? Years of education: Not on file   ??? Highest education level: Not on file   Tobacco Use   ??? Smoking status: Former Smoker   ??? Smokeless tobacco: Never Used   ??? Tobacco comment: reported as quit smoking around 1990s per spouse   Substance and Sexual Activity   ??? Alcohol use: No   ??? Drug use: Yes     Types: Marijuana   ??? Sexual activity: Never       Family History     Family History   Problem Relation Age of Onset   ??? Hypertension Mother    ??? Other Mother         OSA   ??? Cancer Father         suspected leukemia per pt's spouse   ??? Cancer Maternal  Aunt    ??? Alcohol abuse Maternal Grandfather        Current Medications     Prior to Admission medications    Medication Sig Start Date End Date Taking? Authorizing Provider   gabapentin (NEURONTIN) 100 mg capsule Take 100 mg by mouth three (3) times daily.   Yes Provider, Historical   losartan (COZAAR) 100 mg tablet Take 100 mg by mouth daily.   Yes Provider, Historical   magnesium oxide (MAG-OX) 400 mg tablet Take 400 mg by mouth daily.   Yes Provider, Historical   montelukast (SINGULAIR) 10 mg tablet Take 10 mg by mouth daily.   Yes Provider, Historical   QUEtiapine SR (SEROQUEL XR) 150 mg sr tablet Take 150 mg by mouth nightly.   Yes Provider, Historical   cloNIDine HCl (CATAPRES) 0.3 mg tablet Take 1 Tab by mouth two (2) times a day. Take bid 02/08/18  Yes Stephanie Coup, MD   insulin glargine (LANTUS SOLOSTAR U-100 INSULIN) 100 unit/mL (3 mL) inpn 32 Units by SubCUTAneous route nightly. 01/19/18  Yes Wallene Huh, PA-C   metFORMIN ER (GLUCOPHAGE XR) 500 mg tablet Take 1 Tab by mouth two (2) times a day. 01/08/18  Yes Wallene Huh, PA-C   Insulin Needles, Disposable, (NANO PEN NEEDLE) 32 gauge x 5/32" ndle To use with Lantus pen 01/03/18  Yes Brock, Monique, PA-C    dilTIAZem CD (CARDIZEM CD) 180 mg ER capsule Take 1 Cap by mouth daily. 01/02/18  Yes Wallene Huh, PA-C   spironolactone (ALDACTONE) 100 mg tablet TAKE 1 TABLET BY MOUTH DAILY 01/02/18  Yes Fransisco Beau, Monique, PA-C   ondansetron (ZOFRAN ODT) 4 mg disintegrating tablet Take 1 Tab by mouth every eight (8) hours as needed for Nausea. Indications: nausea 12/24/17  Yes Hilton Sinclair, PA-C   albuterol (ACCUNEB) 1.25 mg/3 mL nebu 3 mL by Nebulization route every six (6) hours as needed (SOB, wheezing). Indications: Asthma Attack 12/07/17  Yes Wallene Huh, PA-C   nabumetone (RELAFEN) 750 mg tablet Take 750 mg by mouth two (2) times a day.   Yes Provider, Historical   albuterol-ipratropium (DUO-NEB) 2.5 mg-0.5 mg/3 ml nebu 3 mL by Nebulization route every six (6) hours as needed.   Yes Provider, Historical   Blood Glucose Control High&Low (ACCU-CHEK AVIVA CONTROL SOLN) soln Check blood sugar daily 11/23/17  Yes Fransisco Beau, Monique, PA-C   Blood-Glucose Meter (ACCU-CHEK AVIVA PLUS METER) misc Check blood sugar daily 11/23/17  Yes Fransisco Beau, Monique, PA-C   glucose blood VI test strips (ACCU-CHEK AVIVA PLUS TEST STRP) strip Check blood sugar daily 11/23/17  Yes Fransisco Beau, Monique, PA-C   alcohol swabs (BD SINGLE USE SWABS REGULAR) padm Check blood sugar daily 11/23/17  Yes Fransisco Beau, Monique, PA-C   lancets (ACCU-CHEK SOFTCLIX LANCETS) misc Check blood sugar daily 11/23/17  Yes Brock, Monique, PA-C   tiotropium bromide (SPIRIVA RESPIMAT) 2.5 mcg/actuation inhaler Take 1 Puff by inhalation daily. 10/11/17  Yes Holladay, Lorra Hals, MD   fluticasone propion-salmeterol (ADVAIR DISKUS) 100-50 mcg/dose diskus inhaler Take 1 Puff by inhalation two (2) times a day. 10/11/17  Yes Holladay, Lorra Hals, MD   traZODone (DESYREL) 50 mg tablet Take 1 Tab by mouth nightly as needed for Sleep. 08/09/17  Yes Einar Grad, MD   rosuvastatin (CRESTOR) 5 mg tablet TAKE 1 TABLET BY MOUTH NIGHTLY 07/13/17  Yes Lake Bells, MD    polyethylene glycol (MIRALAX) 17 gram packet Take 1 Packet by mouth daily. 05/09/17  Yes Lake Bells, MD  triamcinolone (NASACORT) 55 mcg nasal inhaler 2 Sprays by Both Nostrils route daily. 02/22/17  Yes Wallene Huh, PA-C   Blood-Glucose Meter monitoring kit Check sugars 2 times per day, fasting and 2 hours after dinner 07/15/15  Yes Lake Bells, MD   Lancets misc Check sugars twice daily 07/15/15  Yes Tharakan, Jolson K, MD   glucose blood VI test strips (ASCENSIA AUTODISC VI, ONE TOUCH ULTRA TEST VI) strip Check sugars twice daily 07/15/15  Yes Lake Bells, MD     Allergies     Allergies   Allergen Reactions   ??? Chocolate [Cocoa] Sneezing     Physical Exam     ED Triage Vitals   ED Encounter Vitals Group      BP 02/18/18 1852 110/87      Pulse (Heart Rate) 02/18/18 1628 (!) 128      Resp Rate 02/18/18 1628 28      Temp 02/18/18 1628 98 ??F (36.7 ??C)      Temp src --       O2 Sat (%) 02/18/18 1625 97 %      Weight 02/18/18 1628 186 lb      Height 02/18/18 1628 '5\' 10"'$        Constitutional: Obese woman, who is sitting up tripoding of the bed.  Appears to be in acute respiratory distress.Marland Kitchen  HEENT: Conjunctiva clear.  PERRLA. Mucous membranes moist, non-erythematous. Surface of the pharynx, palate, and tongue are pink, moist and without lesions.  Neck: supple, non tender, symmetrical, no masses or JVD.   Respiratory: lungs diffuse expiratory wheezing, and very poor air movement, with labored respirations.  And tachypnea and accessory muscle use.  Cardiovascular: heart tachycardic rate and rhythm without murmur rubs or gallops.   Calves soft and non-tender.  No peripheral edema or significant variscosities.  No signs of DVT.  Gastrointestinal:  Abdomen soft, mild midline tenderness secondary to incarcerated ventral hernia.  No guarding or rebound.  Musculoskeletal: Nail beds pink with prompt capillary refill  Integumentary: warm and dry without rashes or lesions   Neurologic: alert and oriented, Sensation intact, motor strength equal and symmetric.  No facial asymmetry or dysarthria.        Impression and Management Plan   This is a new problem for this patient.  Patient who is in acute respiratory distress.  Impending respiratory failure.  We will obtain laboratory studies, chest x-ray, and provide nebulizer treatment.      Diagnostic Studies   Lab:   Recent Results (from the past 12 hour(s))   CBC WITH AUTOMATED DIFF    Collection Time: 02/18/18  5:15 PM   Result Value Ref Range    WBC 18.9 (H) 4.0 - 11.0 1000/mm3    RBC 5.05 3.60 - 5.20 M/uL    HGB 14.7 13.0 - 17.2 gm/dl    HCT 44.7 37.0 - 50.0 %    MCV 88.5 80.0 - 98.0 fL    MCH 29.1 25.4 - 34.6 pg    MCHC 32.9 30.0 - 36.0 gm/dl    PLATELET 336 140 - 450 1000/mm3    MPV 11.1 (H) 6.0 - 10.0 fL    RDW-SD 45.4 36.4 - 46.3      NRBC 0 0 - 0      IMMATURE GRANULOCYTES 0.5 0.0 - 3.0 %    NEUTROPHILS 91.3 (H) 34 - 64 %    LYMPHOCYTES 5.8 (L) 28 - 48 %    MONOCYTES 2.2 1 - 13 %  EOSINOPHILS 0.1 0 - 5 %    BASOPHILS 0.1 0 - 3 %   METABOLIC PANEL, COMPREHENSIVE    Collection Time: 02/18/18  5:15 PM   Result Value Ref Range    Sodium 136 136 - 145 mEq/L    Potassium 4.7 3.5 - 5.1 mEq/L    Chloride 102 98 - 107 mEq/L    CO2 24 21 - 32 mEq/L    Glucose 178 (H) 74 - 106 mg/dl    BUN 31 (H) 7 - 25 mg/dl    Creatinine 1.3 0.6 - 1.3 mg/dl    GFR est AA 53.0      GFR est non-AA 44      Calcium 9.5 8.5 - 10.1 mg/dl    AST (SGOT) 5 (L) 15 - 37 U/L    ALT (SGPT) 20 12 - 78 U/L    Alk. phosphatase 58 45 - 117 U/L    Bilirubin, total 0.5 0.2 - 1.0 mg/dl    Protein, total 8.3 (H) 6.4 - 8.2 gm/dl    Albumin 3.7 3.4 - 5.0 gm/dl    Anion gap 10 5 - 15 mmol/L   NT-PRO BNP    Collection Time: 02/18/18  5:15 PM   Result Value Ref Range    NT pro-BNP 7.0 0.0 - 125.0 pg/ml   TROPONIN I    Collection Time: 02/18/18  5:15 PM   Result Value Ref Range    Troponin-I <0.015 0.000 - 0.045 ng/ml   D DIMER    Collection Time: 02/18/18  5:15 PM    Result Value Ref Range    D DIMER 0.90 (H) 0.01 - 0.50 ug/mL (FEU)   POC BLOOD GAS + LACTIC ACID    Collection Time: 02/18/18  6:03 PM   Result Value Ref Range    pH 7.393 7.350 - 7.450      PCO2 36.8 35.0 - 45.0 mm Hg    PO2 174.0 (H) 75 - 100 mm Hg    BICARBONATE 22.5 18.0 - 26.0 mmol/L    O2 SAT 100.0 90 - 100 %    CO2, TOTAL 24.0 24 - 29 mmol/L    Lactic Acid 5.40 (HH) 0.40 - 2.00 mmol/L    BASE EXCESS -2 -2 - 3 mmol/L    Patient temp. 98.0      Sample type Art      FIO2 40      SITE R Radial      DEVICE BiPAP      ALLENS TEST Pass      IPAP/PIP 18      EPAP/CPAP/PEEP 8      Respiratory Rate 12      SET RATE 10      VTEXP 850     LACTIC ACID    Collection Time: 02/18/18  6:45 PM   Result Value Ref Range    Lactic Acid 3.9 (H) 0.4 - 2.0 mmol/L   GLUCOSE, POC    Collection Time: 02/18/18 10:55 PM   Result Value Ref Range    Glucose (POC) 182 (H) 65 - 105 mg/dL       Imaging:    Xr Chest Sngl V    Result Date: 02/18/2018  INDICATION: Respiratory failure   EXAMINATION: XR CHEST SNGL V COMPARISON: 02/04/2018 FINDINGS: The study shows a normal sized heart.. Stable 17 mm nodule at the left lower lobe.. Bullous emphysema.     IMPRESSION: 1. Stable 17 mm nodule at the left lower lobe.  2. Bullous emphysema.     Cta Chest W Or W Wo Cont    Result Date: 02/18/2018  EXAM: CTA CHEST W OR W WO CONT INDICATION: Respiratory failure, with elevated d-dimer..    TECHNIQUE: CTA scan of the chest with IV contrast including with 3D MIP Images including coronal and sagittal images. DICOM format image data is available to non-affiliated external healthcare facilities or entities on a secure, media free, reciprocally searchable basis with patient authorization for 12 months following the date of the study. COMPARISON: No relevant prior studies available. FINDINGS: Pulmonary arteries: Unremarkable. No pulmonary embolism. Aorta: No acute findings. No thoracic aortic aneurysm or dissection. Lungs: Stable 19 mm  left lower lobe lung nodule. Bullous emphysema. Lymph nodes: Unremarkable. No enlarged lymph nodes. Esophagus: Normal Pleural spaces: Unremarkable. No significant effusion. No pneumothorax. Heart: Unremarkable. No cardiomegaly. No significant pericardial effusion. No evidence of right ventricular dysfunction. Thyroid: Normal Chest wall: No abnormal findings. Bones: No fractures identified.     IMPRESSION: 1. No evidence of pulmonary embolism. 2. Stable 19 mm left lower lobe lung nodule. 3. Bullous emphysema.     Ct Abd Pelv W Cont    Result Date: 02/18/2018  EXAM: CT ABD PELV W CONT INDICATION: Worsening abdominal pain, with history of incarcerated ventral hernia.    TECHNIQUE: Axial CT scan of the abdomen and pelvis with   IV contrast    including coronal and sagittal reconstructions.  DICOM format image data is available to non-affiliated external healthcare facilities or entities on a secure, media free, reciprocally searchable basis with patient authorization for 12 months following the date of the study. COMPARISON: 01/29/2018. FINDINGS: ABDOMEN: Liver: Unremarkable. No mass. Gallbladder and bile ducts: Unremarkable. No calcified stones. No ductal dilatation. Pancreas: Unremarkable. No ductal dilatation. No mass. Spleen: Unchanged splenic lesions. Adrenals: Unremarkable. No mass. Kidneys and ureters: Unremarkable. No hydronephrosis. No solid mass. Appendix: No findings to suggest appendicitis. Stomach and bowel: Stomach unremarkable. No obstruction. No mucosal thickening. No inflammatory changes. Multiple sigmoid diverticula without diverticulitis. Peritoneum: Unremarkable. No significant fluid collection. No free air. Lymph nodes: Unremarkable. No enlarged lymph nodes. Vasculature: Unremarkable. No aortic aneurysm. Bones: No acute fracture. Abdominal wall: Unchanged fat and fluid containing ventral hernia. PELVIS: Bladder: Unremarkable. No mass. Reproductive: Unremarkable as visualized.      IMPRESSION: 1. Multiple sigmoid diverticula without diverticulitis. 2. Unchanged fat and fluid containing ventral hernia. 3. Unchanged splenic lesions.       Ekg: On my reading EKG shows sinus tachycardia at 114 bpm, no ectopy is noted, no ischemic changes are appreciated.  ED Course/Medical Decision Making     Patient remained hemodynamically stable.  However she was in respiratory distress upon arrival.  Patient was placed on BiPAP, given nebulizer treatments.  The seem to help considerably.  In addition she was given a dose of Solu-Medrol.    I believe elevated white blood cell counts most likely secondary to the steroids the patient is chronically on.    Patient also noted to have elevated lactic acid.  At this point I see no signs of sepsis.  Lactic acid is trending down.  We will hydrate patient and recheck.    CT scan of chest is negative for pulmonary embolism.    CT scan of abdomen and pelvis show no acute findings.    I have reviewed the imaging personally, and agree with the readings above.    Patient was discussed with Dr. Tonye Royalty, who evaluated patient in the emergency department.  Agrees to admit to stepdown.      Critical care:  Critical care time excluding procedures, but including direct patient care, reviewing medical records, evaluating results of diagnostic testing, discussions with family members, and consulting with physicians: 40 minutes    Medications   albuterol-ipratropium (DUO-NEB) 2.5 MG-0.5 MG/3 ML (has no administration in time range)   cloNIDine HCl (CATAPRES) tablet 0.3 mg (0 mg Oral Held 02/19/18 0000)   dilTIAZem CD (CARDIZEM CD) capsule 180 mg (has no administration in time range)   gabapentin (NEURONTIN) capsule 100 mg (100 mg Oral Given 02/18/18 2340)   losartan (COZAAR) tablet 100 mg (has no administration in time range)   magnesium oxide (MAG-OX) tablet 400 mg (has no administration in time range)   montelukast (SINGULAIR) tablet 10 mg (10 mg Oral Given 02/18/18 2340)    QUEtiapine SR (SEROquel XR) tablet 150 mg (150 mg Oral Given 02/18/18 2340)   rosuvastatin (CRESTOR) tablet 5 mg (5 mg Oral Given 02/18/18 2340)   spironolactone (ALDACTONE) tablet 100 mg (has no administration in time range)   tiotropium bromide (SPIRIVA RESPIMAT) 2.5 mcg /actuation (has no administration in time range)   traZODone (DESYREL) tablet 50 mg (50 mg Oral Given 02/18/18 2340)   fluticasone propionate (FLONASE) 50 mcg/actuation nasal spray 2 Spray (has no administration in time range)   sodium chloride (NS) flush 5-10 mL (10 mL IntraVENous Given 02/18/18 2345)   sodium chloride (NS) flush 5-10 mL (10 mL IntraVENous Given 02/18/18 2341)   naloxone (NARCAN) injection 0.1 mg (has no administration in time range)   acetaminophen (TYLENOL) tablet 650 mg (has no administration in time range)   oxyCODONE-acetaminophen (PERCOCET) 5-325 mg per tablet 1 Tab (1 Tab Oral Given 02/18/18 2340)   heparin (porcine) injection 5,000 Units (5,000 Units SubCUTAneous Given 02/18/18 2340)   methylPREDNISolone (PF) (SOLU-MEDROL) injection 40 mg (has no administration in time range)   fluticasone-vilanterol (BREO ELLIPTA) 122mg-25mcg/puff (has no administration in time range)   albuterol-ipratropium (DUO-NEB) 2.5 MG-0.5 MG/3 ML (3 mL Nebulization Given 02/18/18 1711)   methylPREDNISolone (PF) (Solu-MEDROL) injection 125 mg (125 mg IntraVENous Given 02/18/18 1739)   albuterol (PROVENTIL VENTOLIN) nebulizer solution 2.5 mg (2.5 mg Nebulization Given 02/18/18 1935)   sodium chloride 0.9 % bolus infusion 1,000 mL (0 mL IntraVENous IV Completed 02/18/18 2205)   iopamidol (ISOVUE-370) 76 % injection 90 mL (90 mL IntraVENous Given 02/18/18 2232)   sodium chloride (NS) flush 5-10 mL (10 mL IntraVENous Given 02/18/18 2243)     Final Diagnosis     1. Acute respiratory failure, unspecified whether with hypoxia or hypercapnia ([place]    2. COPD exacerbation ([facility])    3. Abdominal pain, unspecified abdominal location    4. Lactic acidosis        Disposition    Admission    Current Discharge Medication List        ROneita Hurt M.D. [facility] February 18, 2018    My signature above authenticates this document and my orders, the final ??  diagnosis (es), discharge prescription (s), and instructions in the Epic ??  record.  If you have any questions please contact [phone]  ??  Nursing notes have been reviewed by the physician/ advanced practice ??  Clinician.    This chart was dictated using [employer] analyst Inadvertent errors may be present.

## 2018-02-18 NOTE — ED Notes (Signed)
Dr Stambaugh in evaluating patient

## 2018-02-18 NOTE — ED Notes (Signed)
Called lab--D-Dimer added on--asked them to run the test on blood already in the lab.

## 2018-02-18 NOTE — ED Notes (Signed)
RT here--Setting up the Bipap

## 2018-02-18 NOTE — ED Notes (Signed)
Took herself off bipap to go to the bathroom--refused to use the bedpan--still very short of breath    RT called and informed to make sure the Bipap back on properly

## 2018-02-18 NOTE — H&P (Signed)
Admission History and Physical  [name]     Patient: [name] [name] Age: 67 y.o. Sex: female    Date of Birth: 1951-01-12 Admit Date: 02/18/2018 Admit Doctor: No admitting provider for patient encounter.   MRN: [id number]  CSN: [id number]  PCP: Wallene Huh, PA-C       Assessment / Plan   COPD with acute exacerbation  Lactic acidosis  Anterior abdominal wall incarcerated fat hernia  Insulin-dependent diabetes mellitus type 2 uncontrolled with hyperglycemia  Hypertension  Hyperlipidemia  Peripheral neuropathy  GERD  Chronic pain syndrome  History hepatitis C    Plan:  -Patient currently on BiPAP, admit to stepdown and wean as tolerated  -Follow CTA chest and CT abdomen pelvis.  -Continue treatment of COPD exacerbation with Solu-Medrol 40 every 6, duo nebs every 4-6 hours by respiratory therapy and home regimen of Spiriva, Advair, Singulair  -Continue home hypertension regimen including clonidine, Cardizem, losartan  -Hold home insulin regimen and use Glucomander protocol  -Continue home hyperlipidemia regimen with Crestor  -Percocet as needed for pain control from abdominal hernia, this is been recommended that patient have outpatient appointment follow-up with Dr. Eulas Post of general surgery, it was recommended she be off all steroids prior to any consideration of surgery.  -Continue home gabapentin for peripheral neuropathy and Seroquel at nighttime with PRN trazodone    Diet: Cardiac  DVT prophylaxis: Heparin    DISPO  -Pt to be admitted  at this time for reasons addressed above, continued hospitalization for ongoing assessment and treatment indicated     Anticipated Date of Discharge: 9/10/2   Anticipated Disposition (home, SNF) : Home    Code Status: Full code    MPOA: Husband, Naoma Diener [phone]      Chief Complaint:   Chief Complaint   Patient presents with   ??? Respiratory Distress         HPI:   [name] [name] is a 68 y.o. year old female extensive past medical history  including COPD, hypertension, insulin dependent diabetes, hyperlipidemia, chronic pain and peripheral neuropathy unknown anterior abdominal wall incarcerated fat hernia presents from home today accompanied by her husband with chief complaint of shortness of breath.  Patient was recently hospitalized here from August 25 August 29 for mild COPD exacerbation and abdominal pain secondary to her known abdominal wall hernia.  She was recommended to have outpatient surgery evaluation once off of all steroids to address the hernia.  She was also treated for mild COPD exacerbation during that hospitalization.  She was discharged with 5 additional days of steroids including prednisone, she states she completed them and felt better for approximately 2 days but for the last 2 days has noticed increased work of breathing, shortness of breath with wheezing.  No productive cough or chest pain associated.      She states that she was given 30 Percocets on the day of discharge and ran out 3 to 4 days ago.  She states she was taking approximately 6 of them a day.  They were helping with the pain, she was unable to see surgery in the outpatient setting yet but has an appointment later this week.      ER course: CBC shows elevated white blood cell count 18.9 which may be reactive to the recent steroid course, hemoglobin at baseline 14.7 and platelets 336.  Metabolic work-up shows normal sodium and potassium BUN elevated 31 creatinine at baseline 1.3.  Serum glucose elevated 178.  LFTs and lipase  unremarkable troponin undetectable.  She was noted to be in extreme respiratory distress such as tripoding with audible wheezing.  ABG was drawn which revealed elevated lactic acid 5.4, pH 7.3902 174 and CO2 36.8, this was collected after patient had been initiated on BiPAP therapy.  Repeat lactic came back at 3.9.  Patient was given 1 L normal saline as well as 125 Solu-Medrol as well as 2 rounds of DuoNeb treatments  while in the emergency room with some subjective improvement of her shortness of breath.  She continued to complain of abdominal pain.  Chest x-ray revealed bullous emphysema but no infiltrate.  D-dimer was elevated 0.9 therefore CTA chest was ordered as well as CT abdomen pelvis to further evaluate this known hernia.    Review of Systems -   Review of Systems   Constitutional: Positive for malaise/fatigue. Negative for chills and fever.   HENT: Negative for congestion, ear pain, sinus pain and sore throat.    Eyes: Negative for blurred vision and photophobia.   Respiratory: Positive for shortness of breath and wheezing. Negative for cough.    Cardiovascular: Negative for chest pain, palpitations and leg swelling.   Gastrointestinal: Positive for abdominal pain and nausea. Negative for constipation, diarrhea, heartburn and vomiting.   Genitourinary: Negative for dysuria and flank pain.   Musculoskeletal: Negative for myalgias.   Skin: Negative for itching and rash.   Neurological: Positive for weakness. Negative for dizziness and headaches.   Endo/Heme/Allergies: Negative for polydipsia.   Psychiatric/Behavioral: Negative for depression. The patient is not nervous/anxious.          Past Medical History:  Past Medical History:   Diagnosis Date   ??? Arthritis    ??? 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 COPD (chronic obstructive pulmonary disease) ([place])    ??? DM2 (diabetes mellitus, type 2) ([place])    ??? GERD (gastroesophageal reflux disease)    ??? Hepatitis C     HEP C   ??? HTN (hypertension)    ??? Lung nodule, multiple     (CT 10/2016) Small left upper lobe and 1.7 x 1.6 cm left lower lobe nodules not significantly changed from 07/23/2016 and 03/22/2016   ??? Marijuana use    ??? PTSD (post-traumatic stress disorder)     lived through the [place] in 1993   ??? Thoracic ascending aortic aneurysm ([place])     4.4cm noted on CT Chest (10/2016)   ??? Thyroid nodule      2.5 cm stable right thyroid nodule (CT 10/2016)       Past Surgical History:  Past Surgical History:   Procedure Laterality Date   ??? HX ENDOSCOPY     ??? HX HIP REPLACEMENT Right 01/29/2015   ??? HX HIP REPLACEMENT     ??? HX HYSTERECTOMY  2010    hysterectomy       Family History:  Family History   Problem Relation Age of Onset   ??? Hypertension Mother    ??? Other Mother         OSA   ??? Cancer Father         suspected leukemia per pt's spouse   ??? Cancer Maternal Aunt    ??? Alcohol abuse Maternal Grandfather        Social History:  Social History     Socioeconomic History   ??? Marital status: MARRIED     Spouse name: Not on file   ???  Number of children: Not on file   ??? Years of education: Not on file   ??? Highest education level: Not on file   Tobacco Use   ??? Smoking status: Former Smoker   ??? Smokeless tobacco: Never Used   ??? Tobacco comment: reported as quit smoking around 1990s per spouse   Substance and Sexual Activity   ??? Alcohol use: No   ??? Drug use: Yes     Types: Marijuana   ??? Sexual activity: Never       Home Medications:  Prior to Admission medications    Medication Sig Start Date End Date Taking? Authorizing Provider   cloNIDine HCl (CATAPRES) 0.3 mg tablet Take 1 Tab by mouth two (2) times a day. Take bid 02/08/18   Stephanie Coup, MD   losartan (COZAAR) 100 mg tablet TAKE 1 TABLET BY MOUTH DAILY FOR HYPERTENSION 02/08/18   Stephanie Coup, MD   predniSONE (DELTASONE) 20 mg tablet Take 40 mg by mouth daily (with breakfast). 02/08/18   Stephanie Coup, MD   insulin glargine (LANTUS SOLOSTAR U-100 INSULIN) 100 unit/mL (3 mL) inpn 32 Units by SubCUTAneous route nightly. 01/19/18   Wallene Huh, PA-C   metFORMIN ER (GLUCOPHAGE XR) 500 mg tablet Take 1 Tab by mouth two (2) times a day. 01/08/18   Wallene Huh, PA-C   gabapentin (NEURONTIN) 100 mg capsule TAKE 1 CAPSULE BY MOUTH THREE TIMES DAILY 01/08/18   Wallene Huh, PA-C   Insulin Needles, Disposable, (NANO PEN NEEDLE) 32 gauge x 5/32" ndle To  use with Lantus pen 01/03/18   Wallene Huh, PA-C   dilTIAZem CD (CARDIZEM CD) 180 mg ER capsule Take 1 Cap by mouth daily. 01/02/18   Wallene Huh, PA-C   montelukast (SINGULAIR) 10 mg tablet TAKE 1 TABLET BY MOUTH DAILY 01/02/18   Wallene Huh, PA-C   spironolactone (ALDACTONE) 100 mg tablet TAKE 1 TABLET BY MOUTH DAILY 01/02/18   Wallene Huh, PA-C   ondansetron (ZOFRAN ODT) 4 mg disintegrating tablet Take 1 Tab by mouth every eight (8) hours as needed for Nausea. Indications: nausea 12/24/17   Hilton Sinclair, PA-C   albuterol (ACCUNEB) 1.25 mg/3 mL nebu 3 mL by Nebulization route every six (6) hours as needed (SOB, wheezing). Indications: Asthma Attack 12/07/17   Wallene Huh, PA-C   nabumetone (RELAFEN) 750 mg tablet Take 750 mg by mouth two (2) times a day.    Provider, Historical   albuterol-ipratropium (DUO-NEB) 2.5 mg-0.5 mg/3 ml nebu 3 mL by Nebulization route.    Provider, Historical   Blood Glucose Control High&Low (ACCU-CHEK AVIVA CONTROL SOLN) soln Check blood sugar daily 11/23/17   Wallene Huh, PA-C   Blood-Glucose Meter (ACCU-CHEK AVIVA PLUS METER) misc Check blood sugar daily 11/23/17   Wallene Huh, PA-C   glucose blood VI test strips (ACCU-CHEK AVIVA PLUS TEST STRP) strip Check blood sugar daily 11/23/17   Wallene Huh, PA-C   alcohol swabs (BD SINGLE USE SWABS REGULAR) padm Check blood sugar daily 11/23/17   Wallene Huh, PA-C   lancets (ACCU-CHEK SOFTCLIX LANCETS) misc Check blood sugar daily 11/23/17   Wallene Huh, PA-C   tiotropium bromide (SPIRIVA RESPIMAT) 2.5 mcg/actuation inhaler Take 1 Puff by inhalation daily. 10/11/17   Holladay, Lorra Hals, MD   fluticasone propion-salmeterol (ADVAIR DISKUS) 100-50 mcg/dose diskus inhaler Take 1 Puff by inhalation two (2) times a day. 10/11/17   Holladay, Lorra Hals, MD   magnesium oxide (MAG-OX) 400 mg tablet TAKE 1 TABLET BY  MOUTH DAILY 08/14/17   Caryl Bis, FNP   traZODone (DESYREL) 50 mg tablet Take 1 Tab by mouth nightly as needed for  Sleep. 08/09/17   Einar Grad, MD   QUEtiapine SR (SEROQUEL XR) 150 mg sr tablet TAKE 1 TABLET BY MOUTH NIGHTLY 07/13/17   [place] Bells, MD   rosuvastatin (CRESTOR) 5 mg tablet TAKE 1 TABLET BY MOUTH NIGHTLY 07/13/17   [place] Bells, MD   polyethylene glycol (MIRALAX) 17 gram packet Take 1 Packet by mouth daily. 05/09/17   [place] Bells, MD   triamcinolone (NASACORT) 55 mcg nasal inhaler 2 Sprays by Both Nostrils route daily. 02/22/17   Wallene Huh, PA-C   Blood-Glucose Meter monitoring kit Check sugars 2 times per day, fasting and 2 hours after dinner 07/15/15   [place] Bells, MD   Lancets misc Check sugars twice daily 07/15/15   [place] Bells, MD   glucose blood VI test strips (ASCENSIA AUTODISC VI, ONE TOUCH ULTRA TEST VI) strip Check sugars twice daily 07/15/15   [place] Bells, MD       Allergies:  Allergies   Allergen Reactions   ??? Chocolate [Cocoa] Sneezing         Physical Exam:     Visit Vitals  BP (!) 118/92   Pulse 91   Temp 98 ??F (36.7 ??C)   Resp 28   Ht 5' 10"  (1.778 m)   Wt 84.4 kg (186 lb)   SpO2 100%   BMI 26.69 kg/m??     Physical Exam   Constitutional: She is oriented to person, place, and time and well-developed, well-nourished, and in no distress. No distress.   HENT:   Head: Normocephalic and atraumatic.   Right Ear: External ear normal.   Left Ear: External ear normal.   Nose: Nose normal.   Mouth/Throat: Oropharynx is clear and moist. Normal dentition.   Eyes: Pupils are equal, round, and reactive to light. Conjunctivae and EOM are normal. Right eye exhibits no discharge. Left eye exhibits no discharge.   Neck: Neck supple. No JVD present. No thyromegaly present.   Cardiovascular: Normal rate, regular rhythm, normal heart sounds and intact distal pulses. Exam reveals no friction rub.   No murmur heard.  Pulmonary/Chest: Effort normal. Tachypnea noted. No respiratory distress. She has wheezes in the right upper field and the left upper field.   On BiPAP    Abdominal: Soft. Bowel sounds are normal. She exhibits no distension. There is no tenderness. There is no rebound and no guarding.   Musculoskeletal: Normal range of motion. She exhibits no edema.   Neurological: She is alert and oriented to person, place, and time. She has normal reflexes. No cranial nerve deficit. Coordination normal.   Skin: Skin is warm and dry. She is not diaphoretic.   Psychiatric: Mood, memory, affect and judgment normal.       Intake and Output:  Current Shift:  No intake/output data recorded.  Last three shifts:  No intake/output data recorded.    Lab/Data Reviewed:  Recent Results (from the past 24 hour(s))   CBC WITH AUTOMATED DIFF    Collection Time: 02/18/18  5:15 PM   Result Value Ref Range    WBC 18.9 (H) 4.0 - 11.0 1000/mm3    RBC 5.05 3.60 - 5.20 M/uL    HGB 14.7 13.0 - 17.2 gm/dl    HCT 44.7 37.0 - 50.0 %    MCV 88.5 80.0 - 98.0 fL  MCH 29.1 25.4 - 34.6 pg    MCHC 32.9 30.0 - 36.0 gm/dl    PLATELET 336 140 - 450 1000/mm3    MPV 11.1 (H) 6.0 - 10.0 fL    RDW-SD 45.4 36.4 - 46.3      NRBC 0 0 - 0      IMMATURE GRANULOCYTES 0.5 0.0 - 3.0 %    NEUTROPHILS 91.3 (H) 34 - 64 %    LYMPHOCYTES 5.8 (L) 28 - 48 %    MONOCYTES 2.2 1 - 13 %    EOSINOPHILS 0.1 0 - 5 %    BASOPHILS 0.1 0 - 3 %   METABOLIC PANEL, COMPREHENSIVE    Collection Time: 02/18/18  5:15 PM   Result Value Ref Range    Sodium 136 136 - 145 mEq/L    Potassium 4.7 3.5 - 5.1 mEq/L    Chloride 102 98 - 107 mEq/L    CO2 24 21 - 32 mEq/L    Glucose 178 (H) 74 - 106 mg/dl    BUN 31 (H) 7 - 25 mg/dl    Creatinine 1.3 0.6 - 1.3 mg/dl    GFR est AA 53.0      GFR est non-AA 44      Calcium 9.5 8.5 - 10.1 mg/dl    AST (SGOT) 5 (L) 15 - 37 U/L    ALT (SGPT) 20 12 - 78 U/L    Alk. phosphatase 58 45 - 117 U/L    Bilirubin, total 0.5 0.2 - 1.0 mg/dl    Protein, total 8.3 (H) 6.4 - 8.2 gm/dl    Albumin 3.7 3.4 - 5.0 gm/dl    Anion gap 10 5 - 15 mmol/L   NT-PRO BNP    Collection Time: 02/18/18  5:15 PM   Result Value Ref Range     NT pro-BNP 7.0 0.0 - 125.0 pg/ml   TROPONIN I    Collection Time: 02/18/18  5:15 PM   Result Value Ref Range    Troponin-I <0.015 0.000 - 0.045 ng/ml   D DIMER    Collection Time: 02/18/18  5:15 PM   Result Value Ref Range    D DIMER 0.90 (H) 0.01 - 0.50 ug/mL (FEU)   POC BLOOD GAS + LACTIC ACID    Collection Time: 02/18/18  6:03 PM   Result Value Ref Range    pH 7.393 7.350 - 7.450      PCO2 36.8 35.0 - 45.0 mm Hg    PO2 174.0 (H) 75 - 100 mm Hg    BICARBONATE 22.5 18.0 - 26.0 mmol/L    O2 SAT 100.0 90 - 100 %    CO2, TOTAL 24.0 24 - 29 mmol/L    Lactic Acid 5.40 (HH) 0.40 - 2.00 mmol/L    BASE EXCESS -2 -2 - 3 mmol/L    Patient temp. 98.0      Sample type Art      FIO2 40      SITE R Radial      DEVICE BiPAP      ALLENS TEST Pass      IPAP/PIP 18      EPAP/CPAP/PEEP 8      Respiratory Rate 12      SET RATE 10      VTEXP 850     LACTIC ACID    Collection Time: 02/18/18  6:45 PM   Result Value Ref Range    Lactic Acid 3.9 (H) 0.4 - 2.0 mmol/L  XR Results (most recent):  Results from Hospital Encounter encounter on 02/18/18   XR CHEST SNGL V    Narrative INDICATION:  Respiratory failure       EXAMINATION:  XR CHEST SNGL V    COMPARISON:  02/04/2018    FINDINGS:  The study shows a normal sized heart.. Stable 17 mm nodule at the left lower  lobe.. Bullous emphysema.      Impression IMPRESSION:    1. Stable 17 mm nodule at the left lower lobe.  2. Bullous emphysema.         Jonnie Kind, DO    February 18, 2018  7:23 PM    Dragon medical dictation software was used for portions of this report.  Unintended voice transcription errors may have occurred.

## 2018-02-18 NOTE — Progress Notes (Signed)
[facility] Services: Medication History    Medication History Completed Prior to Order Reconciliation?  NO  If "no" and discrepancies were noted please contact attending physician or pharmacist to follow-up: N/A - Medication history was accurate at the time medications were ordered.    Information obtained from (list all that apply, 2 sources preferred): Patient and Pharmacy (Pharmacy phone #   )   If a history was not reviewed directly with patient/caregiver please comment with the reason why:      Antibiotic use in the last 90 days (3 months): YES      Missing Medication Identified  NO  Number of medications:   Indicate action taken:       Wrong Medication Identified NO   Number of medications:   Indicate action taken:       Wrong Dose/Interval/Route Identified NO              Number of medications:    Indicate action taken:         Is patient currently taking warfarin:  No        Medication Compliance Issues and/or Medication Concerns:       Allergies: Chocolate [cocoa]    Prior to Admission Medications:    Prior to Admission Medications   Prescriptions Last Dose Informant Patient Reported? Taking?   Blood Glucose Control High&Low (ACCU-CHEK AVIVA CONTROL SOLN) soln 02/11/2018 at Unknown time  No Yes   Sig: Check blood sugar daily   Blood-Glucose Meter (ACCU-CHEK AVIVA PLUS METER) misc 02/11/2018 at Unknown time  No Yes   Sig: Check blood sugar daily   Blood-Glucose Meter monitoring kit 02/11/2018 at Unknown time  No Yes   Sig: Check sugars 2 times per day, fasting and 2 hours after dinner   Insulin Needles, Disposable, (NANO PEN NEEDLE) 32 gauge x 5/32" ndle 02/11/2018 at Unknown time  No Yes   Sig: To use with Lantus pen   Lancets misc 02/11/2018 at Unknown time  No Yes   Sig: Check sugars twice daily   QUEtiapine SR (SEROQUEL XR) 150 mg sr tablet   Yes Yes   Sig: Take 150 mg by mouth nightly.   albuterol (ACCUNEB) 1.25 mg/3 mL nebu   No Yes   Sig: 3 mL by Nebulization route every six (6) hours as needed (SOB,  wheezing). Indications: Asthma Attack   albuterol-ipratropium (DUO-NEB) 2.5 mg-0.5 mg/3 ml nebu   Yes Yes   Sig: 3 mL by Nebulization route every six (6) hours as needed.   alcohol swabs (BD SINGLE USE SWABS REGULAR) padm 02/11/2018 at Unknown time  No Yes   Sig: Check blood sugar daily   cloNIDine HCl (CATAPRES) 0.3 mg tablet   No Yes   Sig: Take 1 Tab by mouth two (2) times a day. Take bid   dilTIAZem CD (CARDIZEM CD) 180 mg ER capsule   No Yes   Sig: Take 1 Cap by mouth daily.   fluticasone propion-salmeterol (ADVAIR DISKUS) 100-50 mcg/dose diskus inhaler   No Yes   Sig: Take 1 Puff by inhalation two (2) times a day.   gabapentin (NEURONTIN) 100 mg capsule   Yes Yes   Sig: Take 100 mg by mouth three (3) times daily.   glucose blood VI test strips (ACCU-CHEK AVIVA PLUS TEST STRP) strip 02/11/2018 at Unknown time  No Yes   Sig: Check blood sugar daily   glucose blood VI test strips (ASCENSIA AUTODISC VI, ONE TOUCH ULTRA TEST VI) strip  02/11/2018 at Unknown time  No Yes   Sig: Check sugars twice daily   insulin glargine (LANTUS SOLOSTAR U-100 INSULIN) 100 unit/mL (3 mL) inpn 02/11/2018 at Unknown time  No Yes   Sig: 32 Units by SubCUTAneous route nightly.   lancets (ACCU-CHEK SOFTCLIX LANCETS) misc 02/11/2018 at Unknown time  No Yes   Sig: Check blood sugar daily   losartan (COZAAR) 100 mg tablet   Yes Yes   Sig: Take 100 mg by mouth daily.   magnesium oxide (MAG-OX) 400 mg tablet   Yes Yes   Sig: Take 400 mg by mouth daily.   metFORMIN ER (GLUCOPHAGE XR) 500 mg tablet   No Yes   Sig: Take 1 Tab by mouth two (2) times a day.   montelukast (SINGULAIR) 10 mg tablet   Yes Yes   Sig: Take 10 mg by mouth daily.   nabumetone (RELAFEN) 750 mg tablet   Yes Yes   Sig: Take 750 mg by mouth two (2) times a day.   ondansetron (ZOFRAN ODT) 4 mg disintegrating tablet   No Yes   Sig: Take 1 Tab by mouth every eight (8) hours as needed for Nausea. Indications: nausea   polyethylene glycol (MIRALAX) 17 gram packet   No Yes    Sig: Take 1 Packet by mouth daily.   rosuvastatin (CRESTOR) 5 mg tablet   No Yes   Sig: TAKE 1 TABLET BY MOUTH NIGHTLY   spironolactone (ALDACTONE) 100 mg tablet   No Yes   Sig: TAKE 1 TABLET BY MOUTH DAILY   tiotropium bromide (SPIRIVA RESPIMAT) 2.5 mcg/actuation inhaler   No Yes   Sig: Take 1 Puff by inhalation daily.   traZODone (DESYREL) 50 mg tablet   No Yes   Sig: Take 1 Tab by mouth nightly as needed for Sleep.   triamcinolone (NASACORT) 55 mcg nasal inhaler   No Yes   Sig: 2 Sprays by Both Nostrils route daily.      Facility-Administered Medications: None         Burr Medico, CPHT   Contact: [phone]

## 2018-02-18 NOTE — ED Triage Notes (Signed)
Arrives Tripod[place]  RT called

## 2018-02-18 NOTE — Other (Signed)
TRANSFER - OUT REPORT:    Verbal report given to [name] [name](name) on [name] [name]  being transferred to icu 4 bed 1412(unit) for routine progression of care       Report consisted of patient???s Situation, Background, Assessment and   Recommendations(SBAR).     Information from the following report(s) SBAR, ED Summary, MAR, Recent Results and Quality Measures was reviewed with the receiving nurse.    Lines:   Peripheral IV 02/18/18 Anterior;Left Forearm (Active)        Opportunity for questions and clarification was provided.      Patient transported with:   RN  ON OXYGEN  CARDIAC MONITOR

## 2018-02-18 NOTE — Progress Notes (Signed)
Unable to show TIGR video at this time, message says "no available channels", will attempt later.

## 2018-02-18 NOTE — H&P (Signed)
H&P by Jonnie Kind, DO at 02/18/18 1923                Author: Jonnie Kind, DO  Service: Hospitalist  Author Type: Physician       Filed: 02/18/18 2224  Date of Service: 02/18/18 1923  Status: Signed          Editor: Jonnie Kind, DO (Physician)                          Admission History and Physical   [name]           Patient: [name] [name]  Age: 67 y.o.  Sex: female          Date of Birth: 08/02/1950  Admit Date: 02/18/2018  Admit Doctor: No admitting provider for patient encounter.         MRN: [id number]   CSN: [id number]   PCP: Wallene Huh, PA-C             Assessment / Plan     COPD with acute exacerbation   Lactic acidosis   Anterior abdominal wall incarcerated fat hernia   Insulin-dependent diabetes mellitus type 2 uncontrolled with hyperglycemia   Hypertension   Hyperlipidemia   Peripheral neuropathy   GERD   Chronic pain syndrome   History hepatitis C      Plan:   -Patient currently on BiPAP, admit to stepdown and wean as tolerated   -Follow CTA chest and CT abdomen pelvis.   -Continue treatment of COPD exacerbation with Solu-Medrol 40 every 6, duo nebs every 4-6 hours by respiratory therapy and home regimen of Spiriva, Advair, Singulair   -Continue home hypertension regimen including clonidine, Cardizem, losartan   -Hold home insulin regimen and use Glucomander protocol   -Continue home hyperlipidemia regimen with Crestor   -Percocet as needed for pain control from abdominal hernia, this is been recommended that patient have outpatient appointment follow-up with Dr. Eulas Post of general surgery, it was recommended she be off all steroids prior to any consideration of surgery.   -Continue home gabapentin for peripheral neuropathy and Seroquel at nighttime with PRN trazodone      Diet: Cardiac   DVT prophylaxis: Heparin      DISPO   -Pt to be admitted  at this time for reasons addressed above, continued hospitalization for ongoing assessment and treatment indicated        Anticipated Date of Discharge: 9/10/2    Anticipated Disposition (home, SNF) : Home      Code Status: Full code      MPOA: Husband, Naoma Diener [phone]         Chief Complaint:      Chief Complaint       Patient presents with        ?  Respiratory Distress                HPI:     [name] [name] is a 67 y.o.  year old female extensive past medical history including COPD, hypertension, insulin dependent diabetes, hyperlipidemia, chronic pain and peripheral  neuropathy unknown anterior abdominal wall incarcerated fat hernia presents from home today accompanied by her husband with chief complaint of shortness of breath.  Patient was recently hospitalized here from August 25 August 29 for mild COPD exacerbation  and abdominal pain secondary to her known abdominal wall hernia.  She was recommended to have outpatient surgery evaluation once off of  all steroids to address the hernia.  She was also treated for mild COPD exacerbation during that hospitalization.   She was discharged with 5 additional days of steroids including prednisone, she states she completed them and felt better for approximately 2 days but for the last 2 days has noticed increased work of breathing, shortness of breath with wheezing.  No  productive cough or chest pain associated.        She states that she was given 30 Percocets on the day of discharge and ran out 3 to 4 days ago.  She states she was taking approximately 6 of them a day.  They were helping with the pain, she was unable to see surgery in the outpatient setting yet but  has an appointment later this week.         ER course: CBC shows elevated white blood cell count 18.9 which may be reactive to the recent steroid course, hemoglobin at baseline 14.7 and platelets 336.  Metabolic work-up shows normal sodium and  potassium BUN elevated 31 creatinine at baseline 1.3.  Serum glucose elevated 178.  LFTs and lipase unremarkable troponin undetectable.  She was noted to be in extreme  respiratory distress such as tripoding with audible wheezing.  ABG was drawn which  revealed elevated lactic acid 5.4, pH 7.3902 174 and CO2 36.8, this was collected after patient had been initiated on BiPAP therapy.  Repeat lactic came back at 3.9.  Patient was given 1 L normal saline as well as 125 Solu-Medrol as well as 2 rounds of  DuoNeb treatments while in the emergency room with some subjective improvement of her shortness of breath.  She continued to complain of abdominal pain.  Chest x-ray revealed bullous emphysema but no infiltrate.  D-dimer was elevated 0.9 therefore CTA  chest was ordered as well as CT abdomen pelvis to further evaluate this known hernia.      Review of Systems -    Review of Systems    Constitutional: Positive for malaise/fatigue. Negative for chills and fever.    HENT: Negative for congestion, ear pain, sinus pain and sore throat.     Eyes: Negative for blurred vision and photophobia.    Respiratory: Positive for shortness of breath and wheezing . Negative for cough.     Cardiovascular: Negative for chest pain, palpitations and leg swelling.    Gastrointestinal: Positive for abdominal pain and nausea . Negative for constipation, diarrhea, heartburn and vomiting.    Genitourinary: Negative for dysuria and flank pain.    Musculoskeletal: Negative for myalgias.    Skin: Negative for itching and rash.    Neurological: Positive for weakness. Negative for dizziness and headaches.    Endo/Heme/Allergies: Negative for polydipsia.    Psychiatric/Behavioral: Negative for depression. The patient is not nervous/anxious.              Past Medical History:     Past Medical History:        Diagnosis  Date         ?  Arthritis       ?  Chronic pain syndrome            related to R hip replacement         ?  COPD (chronic obstructive pulmonary disease) ([place])       ?  DM2 (diabetes mellitus, type 2) ([place])       ?  GERD (gastroesophageal reflux disease)       ?  Hepatitis C            HEP C         ?   HTN (hypertension)       ?  Lung nodule, multiple            (CT 10/2016) Small left upper lobe and 1.7 x 1.6 cm left lower lobe nodules not significantly changed from 07/23/2016 and 03/22/2016         ?  Marijuana use       ?  PTSD (post-traumatic stress disorder)            lived through the [place] in 1993         ?  Thoracic ascending aortic aneurysm ([place])            4.4cm noted on CT Chest (10/2016)         ?  Thyroid nodule            2.5 cm stable right thyroid nodule (CT 10/2016)           Past Surgical History:     Past Surgical History:         Procedure  Laterality  Date          ?  HX ENDOSCOPY         ?  HX HIP REPLACEMENT  Right  01/29/2015     ?  HX HIP REPLACEMENT         ?  HX HYSTERECTOMY    2010          hysterectomy           Family History:     Family History         Problem  Relation  Age of Onset          ?  Hypertension  Mother       ?  Other  Mother                OSA          ?  Cancer  Father                suspected leukemia per pt's spouse          ?  Cancer  Maternal Aunt            ?  Alcohol abuse  Maternal Grandfather             Social History:     Social History          Socioeconomic History         ?  Marital status:  MARRIED              Spouse name:  Not on file         ?  Number of children:  Not on file     ?  Years of education:  Not on file     ?  Highest education level:  Not on file       Tobacco Use         ?  Smoking status:  Former Smoker     ?  Smokeless tobacco:  Never Used        ?  Tobacco comment: reported as quit smoking around 1990s per spouse       Substance and Sexual Activity         ?  Alcohol use:  No     ?  Drug use:  Yes              Types:  Marijuana         ?  Sexual activity:  Never           Home Medications:     Prior to Admission medications             Medication  Sig  Start Date  End Date  Taking?  Authorizing Provider            cloNIDine HCl (CATAPRES) 0.3 mg tablet  Take 1 Tab by mouth two (2) times a day. Take bid  02/08/18       Stephanie Coup, MD     losartan (COZAAR) 100 mg tablet  TAKE 1 TABLET BY MOUTH DAILY FOR HYPERTENSION  02/08/18      Stephanie Coup, MD     predniSONE (DELTASONE) 20 mg tablet  Take 40 mg by mouth daily (with breakfast).  02/08/18      Stephanie Coup, MD     insulin glargine (LANTUS SOLOSTAR U-100 INSULIN) 100 unit/mL (3 mL) inpn  32 Units by SubCUTAneous route nightly.  01/19/18      Wallene Huh, PA-C     metFORMIN ER (GLUCOPHAGE XR) 500 mg tablet  Take 1 Tab by mouth two (2) times a day.  01/08/18      Wallene Huh, PA-C     gabapentin (NEURONTIN) 100 mg capsule  TAKE 1 CAPSULE BY MOUTH THREE TIMES DAILY  01/08/18      Wallene Huh, PA-C     Insulin Needles, Disposable, (NANO PEN NEEDLE) 32 gauge x 5/32" ndle  To use with Lantus pen  01/03/18      Wallene Huh, PA-C     dilTIAZem CD (CARDIZEM CD) 180 mg ER capsule  Take 1 Cap by mouth daily.  01/02/18      Wallene Huh, PA-C     montelukast (SINGULAIR) 10 mg tablet  TAKE 1 TABLET BY MOUTH DAILY  01/02/18      Wallene Huh, PA-C     spironolactone (ALDACTONE) 100 mg tablet  TAKE 1 TABLET BY MOUTH DAILY  01/02/18      Wallene Huh, PA-C     ondansetron (ZOFRAN ODT) 4 mg disintegrating tablet  Take 1 Tab by mouth every eight (8) hours as needed for Nausea. Indications: nausea  12/24/17      Hilton Sinclair, PA-C     albuterol (ACCUNEB) 1.25 mg/3 mL nebu  3 mL by Nebulization route every six (6) hours as needed (SOB, wheezing). Indications: Asthma Attack  12/07/17      Wallene Huh, PA-C     nabumetone (RELAFEN) 750 mg tablet  Take 750 mg by mouth two (2) times a day.        Provider, Historical     albuterol-ipratropium (DUO-NEB) 2.5 mg-0.5 mg/3 ml nebu  3 mL by Nebulization route.        Provider, Historical     Blood Glucose Control High&Low (ACCU-CHEK AVIVA CONTROL SOLN) soln  Check blood sugar daily  11/23/17      Wallene Huh, PA-C     Blood-Glucose Meter (ACCU-CHEK AVIVA PLUS METER) misc  Check blood sugar daily  11/23/17      Wallene Huh, PA-C      glucose blood VI test strips (ACCU-CHEK AVIVA PLUS TEST STRP) strip  Check blood sugar daily  11/23/17  Wallene Huh, PA-C     alcohol swabs (BD SINGLE USE SWABS REGULAR) padm  Check blood sugar daily  11/23/17      Wallene Huh, PA-C     lancets (ACCU-CHEK SOFTCLIX LANCETS) misc  Check blood sugar daily  11/23/17      Wallene Huh, PA-C     tiotropium bromide (SPIRIVA RESPIMAT) 2.5 mcg/actuation inhaler  Take 1 Puff by inhalation daily.  10/11/17      Holladay, Lorra Hals, MD     fluticasone propion-salmeterol (ADVAIR DISKUS) 100-50 mcg/dose diskus inhaler  Take 1 Puff by inhalation two (2) times a day.  10/11/17      Holladay, Lorra Hals, MD     magnesium oxide (MAG-OX) 400 mg tablet  TAKE 1 TABLET BY MOUTH DAILY  08/14/17      Caryl Bis, FNP     traZODone (DESYREL) 50 mg tablet  Take 1 Tab by mouth nightly as needed for Sleep.  08/09/17      Einar Grad, MD     QUEtiapine SR (SEROQUEL XR) 150 mg sr tablet  TAKE 1 TABLET BY MOUTH NIGHTLY  07/13/17      Lake Bells, MD     rosuvastatin (CRESTOR) 5 mg tablet  TAKE 1 TABLET BY MOUTH NIGHTLY  07/13/17      Lake Bells, MD     polyethylene glycol (MIRALAX) 17 gram packet  Take 1 Packet by mouth daily.  05/09/17      Lake Bells, MD     triamcinolone (NASACORT) 55 mcg nasal inhaler  2 Sprays by Both Nostrils route daily.  02/22/17      Wallene Huh, PA-C     Blood-Glucose Meter monitoring kit  Check sugars 2 times per day, fasting and 2 hours after dinner  07/15/15      Lake Bells, MD     Lancets misc  Check sugars twice daily  07/15/15      Lake Bells, MD            glucose blood VI test strips (ASCENSIA AUTODISC VI, ONE TOUCH ULTRA TEST VI) strip  Check sugars twice daily  07/15/15      [place]ake Bells, MD           Allergies:     Allergies        Allergen  Reactions         ?  Chocolate [Cocoa]  Sneezing                Physical Exam:        Visit Vitals      BP  (!) 118/92     Pulse  91     Temp  98 ??F (36.7 ??C)     Resp  28      Ht  '5\' 10"'  (1.778 m)     Wt  84.4 kg (186 lb)     SpO2  100%        BMI  26.69 kg/m??        Physical Exam    Constitutional: She is oriented to person, place, and time and well-developed, well-nourished, and in no distress. No distress.    HENT:    Head: Normocephalic and atraumatic.   Right Ear: External ear normal.   Left Ear: External ear normal.    Nose: Nose normal.    Mouth/Throat: Oropharynx is clear and moist. Normal dentition.    Eyes: Pupils are equal,  round, and reactive to light. Conjunctivae and EOM are normal. Right eye exhibits no discharge. Left eye exhibits no discharge.    Neck: Neck supple. No JVD present. No thyromegaly present.    Cardiovascular: Normal rate, regular rhythm, normal heart sounds and intact distal pulses. Exam reveals no friction rub.    No murmur heard.   Pulmonary/Chest: Effort normal. Tachypnea noted. No respiratory distress. She has  wheezes in the right upper field and  the left upper field.   On BiPAP    Abdominal: Soft. Bowel sounds are normal. She exhibits no distension. There is no tenderness. There is no rebound and no guarding.   Musculoskeletal: Normal range of motion. She exhibits no edema.   Neurological: She is alert and oriented to person, place, and time. She has normal reflexes. No cranial nerve deficit. Coordination normal.    Skin: Skin is warm and dry. She is not diaphoretic.   Psychiatric: Mood, memory, affect and judgment normal.          Intake and Output:   Current Shift:  No intake/output data recorded.   Last three shifts:  No intake/output data recorded.      Lab/Data Reviewed:     Recent Results (from the past 24 hour(s))     CBC WITH AUTOMATED DIFF          Collection Time: 02/18/18  5:15 PM         Result  Value  Ref Range            WBC  18.9 (H)  4.0 - 11.0 1000/mm3       RBC  5.05  3.60 - 5.20 M/uL       HGB  14.7  13.0 - 17.2 gm/dl       HCT  44.7  37.0 - 50.0 %       MCV  88.5  80.0 - 98.0 fL       MCH  29.1  25.4 - 34.6 pg       MCHC  32.9   30.0 - 36.0 gm/dl       PLATELET  336  140 - 450 1000/mm3       MPV  11.1 (H)  6.0 - 10.0 fL       RDW-SD  45.4  36.4 - 46.3         NRBC  0  0 - 0         IMMATURE GRANULOCYTES  0.5  0.0 - 3.0 %       NEUTROPHILS  91.3 (H)  34 - 64 %       LYMPHOCYTES  5.8 (L)  28 - 48 %       MONOCYTES  2.2  1 - 13 %       EOSINOPHILS  0.1  0 - 5 %       BASOPHILS  0.1  0 - 3 %       METABOLIC PANEL, COMPREHENSIVE          Collection Time: 02/18/18  5:15 PM         Result  Value  Ref Range            Sodium  136  136 - 145 mEq/L       Potassium  4.7  3.5 - 5.1 mEq/L            Chloride  102  98 - 107 mEq/L  CO2  24  21 - 32 mEq/L       Glucose  178 (H)  74 - 106 mg/dl       BUN  31 (H)  7 - 25 mg/dl       Creatinine  1.3  0.6 - 1.3 mg/dl       GFR est AA  53.0          GFR est non-AA  44          Calcium  9.5  8.5 - 10.1 mg/dl       AST (SGOT)  5 (L)  15 - 37 U/L       ALT (SGPT)  20  12 - 78 U/L       Alk. phosphatase  58  45 - 117 U/L       Bilirubin, total  0.5  0.2 - 1.0 mg/dl       Protein, total  8.3 (H)  6.4 - 8.2 gm/dl       Albumin  3.7  3.4 - 5.0 gm/dl       Anion gap  10  5 - 15 mmol/L       NT-PRO BNP          Collection Time: 02/18/18  5:15 PM         Result  Value  Ref Range            NT pro-BNP  7.0  0.0 - 125.0 pg/ml       TROPONIN I          Collection Time: 02/18/18  5:15 PM         Result  Value  Ref Range            Troponin-I  <0.015  0.000 - 0.045 ng/ml       D DIMER          Collection Time: 02/18/18  5:15 PM         Result  Value  Ref Range            D DIMER  0.90 (H)  0.01 - 0.50 ug/mL (FEU)       POC BLOOD GAS + LACTIC ACID          Collection Time: 02/18/18  6:03 PM         Result  Value  Ref Range            pH  7.393  7.350 - 7.450         PCO2  36.8  35.0 - 45.0 mm Hg       PO2  174.0 (H)  75 - 100 mm Hg       BICARBONATE  22.5  18.0 - 26.0 mmol/L       O2 SAT  100.0  90 - 100 %       CO2, TOTAL  24.0  24 - 29 mmol/L       Lactic Acid  5.40 (HH)  0.40 - 2.00 mmol/L       BASE EXCESS  -2   -2 - 3 mmol/L       Patient temp.  98.0          Sample type  Art          FIO2  40          SITE  R Radial          DEVICE  BiPAP  ALLENS TEST  Pass          IPAP/PIP  18          EPAP/CPAP/PEEP  8          Respiratory Rate  12          SET RATE  10          VTEXP  850          LACTIC ACID          Collection Time: 02/18/18  6:45 PM         Result  Value  Ref Range            Lactic Acid  3.9 (H)  0.4 - 2.0 mmol/L        XR Results (most recent):     Results from Hospital Encounter encounter on 02/18/18     XR CHEST SNGL V           Narrative  INDICATION:   Respiratory failure         EXAMINATION:   XR CHEST SNGL V      COMPARISON:   02/04/2018      FINDINGS:   The study shows a normal sized heart.. Stable 17 mm nodule at the left lower   lobe.. Bullous emphysema.              Impression  IMPRESSION:      1. Stable 17 mm nodule at the left lower lobe.   2. Bullous emphysema.              Jonnie Kind, DO      February 18, 2018   7:23 PM      Dragon medical dictation software was used for portions of this report.   Unintended voice transcription errors may have occurred.

## 2018-02-18 NOTE — ED Notes (Signed)
Arrives [place]  RT called

## 2018-02-18 NOTE — ED Notes (Signed)
Called lab--D-Dimer added on--asked them to run the test on blood already in the lab.

## 2018-02-18 NOTE — ED Notes (Signed)
Dr Truddie Crumble in evaluating patient

## 2018-02-18 NOTE — ED Notes (Signed)
Took herself off bipap to go to the bathroom--refused to use the bedpan--still very short of breath    RT called and informed to make sure the Bipap back on properly

## 2018-02-18 NOTE — Progress Notes (Signed)
 Unable to show TIGR video at this time, message says no available channels, will attempt later.

## 2018-02-18 NOTE — ED Notes (Signed)
RT here--Setting up the Bipap

## 2018-02-18 NOTE — Progress Notes (Signed)
[facility] Pharmacy Services: Medication History    Medication History Completed Prior to Order Reconciliation?  NO  If "no" and discrepancies were noted please contact attending physician or pharmacist to follow-up: N/A - Medication history was accurate at the time medications were ordered.    Information obtained from (list all that apply, 2 sources preferred): Patient and Pharmacy (Pharmacy phone #   )   If a history was not reviewed directly with patient/caregiver please comment with the reason why:      Antibiotic use in the last 90 days (3 months): YES      Missing Medication Identified  NO  Number of medications:   Indicate action taken:       Wrong Medication Identified NO   Number of medications:   Indicate action taken:       Wrong Dose/Interval/Route Identified NO              Number of medications:    Indicate action taken:         Is patient currently taking warfarin:  No        Medication Compliance Issues and/or Medication Concerns:       Allergies: Chocolate [cocoa]    Prior to Admission Medications:    Prior to Admission Medications   Prescriptions Last Dose Informant Patient Reported? Taking?   Blood Glucose Control High&Low (ACCU-CHEK AVIVA CONTROL SOLN) soln 02/11/2018 at Unknown time  No Yes   Sig: Check blood sugar daily   Blood-Glucose Meter (ACCU-CHEK AVIVA PLUS METER) misc 02/11/2018 at Unknown time  No Yes   Sig: Check blood sugar daily   Blood-Glucose Meter monitoring kit 02/11/2018 at Unknown time  No Yes   Sig: Check sugars 2 times per day, fasting and 2 hours after dinner   Insulin Needles, Disposable, (NANO PEN NEEDLE) 32 gauge x 5/32" ndle 02/11/2018 at Unknown time  No Yes   Sig: To use with Lantus pen   Lancets misc 02/11/2018 at Unknown time  No Yes   Sig: Check sugars twice daily   QUEtiapine SR (SEROQUEL XR) 150 mg sr tablet   Yes Yes   Sig: Take 150 mg by mouth nightly.   albuterol (ACCUNEB) 1.25 mg/3 mL nebu   No Yes   Sig: 3 mL by Nebulization route every six (6) hours as needed (SOB,  wheezing). Indications: Asthma Attack   albuterol-ipratropium (DUO-NEB) 2.5 mg-0.5 mg/3 ml nebu   Yes Yes   Sig: 3 mL by Nebulization route every six (6) hours as needed.   alcohol swabs (BD SINGLE USE SWABS REGULAR) padm 02/11/2018 at Unknown time  No Yes   Sig: Check blood sugar daily   cloNIDine HCl (CATAPRES) 0.3 mg tablet   No Yes   Sig: Take 1 Tab by mouth two (2) times a day. Take bid   dilTIAZem CD (CARDIZEM CD) 180 mg ER capsule   No Yes   Sig: Take 1 Cap by mouth daily.   fluticasone propion-salmeterol (ADVAIR DISKUS) 100-50 mcg/dose diskus inhaler   No Yes   Sig: Take 1 Puff by inhalation two (2) times a day.   gabapentin (NEURONTIN) 100 mg capsule   Yes Yes   Sig: Take 100 mg by mouth three (3) times daily.   glucose blood VI test strips (ACCU-CHEK AVIVA PLUS TEST STRP) strip 02/11/2018 at Unknown time  No Yes   Sig: Check blood sugar daily   glucose blood VI test strips (ASCENSIA AUTODISC VI, ONE TOUCH ULTRA TEST VI) strip  02/11/2018 at Unknown time  No Yes   Sig: Check sugars twice daily   insulin glargine (LANTUS SOLOSTAR U-100 INSULIN) 100 unit/mL (3 mL) inpn 02/11/2018 at Unknown time  No Yes   Sig: 32 Units by SubCUTAneous route nightly.   lancets (ACCU-CHEK SOFTCLIX LANCETS) misc 02/11/2018 at Unknown time  No Yes   Sig: Check blood sugar daily   losartan (COZAAR) 100 mg tablet   Yes Yes   Sig: Take 100 mg by mouth daily.   magnesium oxide (MAG-OX) 400 mg tablet   Yes Yes   Sig: Take 400 mg by mouth daily.   metFORMIN ER (GLUCOPHAGE XR) 500 mg tablet   No Yes   Sig: Take 1 Tab by mouth two (2) times a day.   montelukast (SINGULAIR) 10 mg tablet   Yes Yes   Sig: Take 10 mg by mouth daily.   nabumetone (RELAFEN) 750 mg tablet   Yes Yes   Sig: Take 750 mg by mouth two (2) times a day.   ondansetron (ZOFRAN ODT) 4 mg disintegrating tablet   No Yes   Sig: Take 1 Tab by mouth every eight (8) hours as needed for Nausea. Indications: nausea   polyethylene glycol (MIRALAX) 17 gram packet   No Yes   Sig: Take 1  Packet by mouth daily.   rosuvastatin (CRESTOR) 5 mg tablet   No Yes   Sig: TAKE 1 TABLET BY MOUTH NIGHTLY   spironolactone (ALDACTONE) 100 mg tablet   No Yes   Sig: TAKE 1 TABLET BY MOUTH DAILY   tiotropium bromide (SPIRIVA RESPIMAT) 2.5 mcg/actuation inhaler   No Yes   Sig: Take 1 Puff by inhalation daily.   traZODone (DESYREL) 50 mg tablet   No Yes   Sig: Take 1 Tab by mouth nightly as needed for Sleep.   triamcinolone (NASACORT) 55 mcg nasal inhaler   No Yes   Sig: 2 Sprays by Both Nostrils route daily.      Facility-Administered Medications: None         Erma Heritage, CPHT   Contact: [phone]

## 2018-02-18 NOTE — ED Provider Notes (Signed)
[facility]  Emergency Department Treatment Report    Patient: [name] [name] Age: 67 y.o. Sex: female    Date of Birth: [birth year] Admit Date: 02/18/2018 PCP: Ernst Breach   MRN: [id number]  CSN: [id number]     Room: I412/I412 Time Dictated: 8:16 PM        Chief Complaint   Dyspnea  History of Present Illness   67 y.o. female with a history of COPD.  Presents complaint of acute onset of shortness of breath.  Patient's husband states that she is been having difficulty breathing for the past 24 hours.  Due to severe dyspnea, patient is unable to provide any history herself.    She was recently admitted, due to an incarcerated ventral hernia.  Surgical consult at that time did not feel that this needed to be emergently repaired.  And felt they should try to optimize her COPD, prior to attempting surgery.    Review of Systems     Unable to perform an adequate HPI and ROS within the constraints imposed by the urgency in the patient's clinical condition.    Past Medical/Surgical History     Past Medical History:   Diagnosis Date   ??? Arthritis    ??? 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 COPD (chronic obstructive pulmonary disease) ( [place])    ??? DM2 (diabetes mellitus, type 2) ([place])    ??? GERD (gastroesophageal reflux disease)    ??? Hepatitis C     HEP C   ??? HTN (hypertension)    ??? Lung nodule, multiple     (CT 10/2016) Small left upper lobe and 1.7 x 1.6 cm left lower lobe nodules not significantly changed from 07/23/2016 and 03/22/2016   ??? Marijuana use    ??? PTSD (post-traumatic stress disorder)     lived through the [place] in 1993   ??? Thoracic ascending aortic aneurysm ([place])     4.4cm noted on CT Chest (10/2016)   ??? Thyroid nodule     2.5 cm stable right thyroid nodule (CT 10/2016)     Past Surgical History:   Procedure Laterality Date   ??? HX ENDOSCOPY     ??? HX HIP REPLACEMENT Right 01/29/2015   ??? HX HIP REPLACEMENT     ??? HX HYSTERECTOMY  2010    hysterectomy        Social History     Social History     Socioeconomic History   ??? Marital status: MARRIED     Spouse name: Not on file   ??? Number of children: Not on file   ??? Years of education: Not on file   ??? Highest education level: Not on file   Tobacco Use   ??? Smoking status: Former Smoker   ??? Smokeless tobacco: Never Used   ??? Tobacco comment: reported as quit smoking around 1990s per spouse   Substance and Sexual Activity   ??? Alcohol use: No   ??? Drug use: Yes     Types: Marijuana   ??? Sexual activity: Never       Family History     Family History   Problem Relation Age of Onset   ??? Hypertension Mother    ??? Other Mother         OSA   ??? Cancer Father         suspected leukemia per pt's spouse   ??? Cancer Maternal  Aunt    ??? Alcohol abuse Maternal Grandfather        Current Medications     Prior to Admission medications    Medication Sig Start Date End Date Taking? Authorizing Provider   gabapentin (NEURONTIN) 100 mg capsule Take 100 mg by mouth three (3) times daily.   Yes Provider, Historical   losartan (COZAAR) 100 mg tablet Take 100 mg by mouth daily.   Yes Provider, Historical   magnesium oxide (MAG-OX) 400 mg tablet Take 400 mg by mouth daily.   Yes Provider, Historical   montelukast (SINGULAIR) 10 mg tablet Take 10 mg by mouth daily.   Yes Provider, Historical   QUEtiapine SR (SEROQUEL XR) 150 mg sr tablet Take 150 mg by mouth nightly.   Yes Provider, Historical   cloNIDine HCl (CATAPRES) 0.3 mg tablet Take 1 Tab by mouth two (2) times a day. Take bid 02/08/18  Yes Stephanie Coup, MD   insulin glargine (LANTUS SOLOSTAR U-100 INSULIN) 100 unit/mL (3 mL) inpn 32 Units by SubCUTAneous route nightly. 01/19/18  Yes Wallene Huh, PA-C   metFORMIN ER (GLUCOPHAGE XR) 500 mg tablet Take 1 Tab by mouth two (2) times a day. 01/08/18  Yes Wallene Huh, PA-C   Insulin Needles, Disposable, (NANO PEN NEEDLE) 32 gauge x 5/32" ndle To use with Lantus pen 01/03/18  Yes Brock, Monique, PA-C   dilTIAZem CD (CARDIZEM CD) 180 mg ER capsule  Take 1 Cap by mouth daily. 01/02/18  Yes Wallene Huh, PA-C   spironolactone (ALDACTONE) 100 mg tablet TAKE 1 TABLET BY MOUTH DAILY 01/02/18  Yes Fransisco Beau, Monique, PA-C   ondansetron (ZOFRAN ODT) 4 mg disintegrating tablet Take 1 Tab by mouth every eight (8) hours as needed for Nausea. Indications: nausea 12/24/17  Yes Hilton Sinclair, PA-C   albuterol (ACCUNEB) 1.25 mg/3 mL nebu 3 mL by Nebulization route every six (6) hours as needed (SOB, wheezing). Indications: Asthma Attack 12/07/17  Yes Wallene Huh, PA-C   nabumetone (RELAFEN) 750 mg tablet Take 750 mg by mouth two (2) times a day.   Yes Provider, Historical   albuterol-ipratropium (DUO-NEB) 2.5 mg-0.5 mg/3 ml nebu 3 mL by Nebulization route every six (6) hours as needed.   Yes Provider, Historical   Blood Glucose Control High&Low (ACCU-CHEK AVIVA CONTROL SOLN) soln Check blood sugar daily 11/23/17  Yes Fransisco Beau, Monique, PA-C   Blood-Glucose Meter (ACCU-CHEK AVIVA PLUS METER) misc Check blood sugar daily 11/23/17  Yes Fransisco Beau, Monique, PA-C   glucose blood VI test strips (ACCU-CHEK AVIVA PLUS TEST STRP) strip Check blood sugar daily 11/23/17  Yes Fransisco Beau, Monique, PA-C   alcohol swabs (BD SINGLE USE SWABS REGULAR) padm Check blood sugar daily 11/23/17  Yes Fransisco Beau, Monique, PA-C   lancets (ACCU-CHEK SOFTCLIX LANCETS) misc Check blood sugar daily 11/23/17  Yes Brock, Monique, PA-C   tiotropium bromide (SPIRIVA RESPIMAT) 2.5 mcg/actuation inhaler Take 1 Puff by inhalation daily. 10/11/17  Yes Holladay, Lorra Hals, MD   fluticasone propion-salmeterol (ADVAIR DISKUS) 100-50 mcg/dose diskus inhaler Take 1 Puff by inhalation two (2) times a day. 10/11/17  Yes Holladay, Lorra Hals, MD   traZODone (DESYREL) 50 mg tablet Take 1 Tab by mouth nightly as needed for Sleep. 08/09/17  Yes Einar Grad, MD   rosuvastatin (CRESTOR) 5 mg tablet TAKE 1 TABLET BY MOUTH NIGHTLY 07/13/17  Yes Lake Bells, MD   polyethylene glycol (MIRALAX) 17 gram packet Take 1 Packet by mouth daily. 05/09/17   Yes Lake Bells, MD  triamcinolone (NASACORT) 55 mcg nasal inhaler 2 Sprays by Both Nostrils route daily. 02/22/17  Yes Wallene Huh, PA-C   Blood-Glucose Meter monitoring kit Check sugars 2 times per day, fasting and 2 hours after dinner 07/15/15  Yes Lake Bells, MD   Lancets misc Check sugars twice daily 07/15/15  Yes Tharakan, Jolson K, MD   glucose blood VI test strips (ASCENSIA AUTODISC VI, ONE TOUCH ULTRA TEST VI) strip Check sugars twice daily 07/15/15  Yes Lake Bells, MD     Allergies     Allergies   Allergen Reactions   ??? Chocolate [Cocoa] Sneezing     Physical Exam     ED Triage Vitals   ED Encounter Vitals Group      BP 02/18/18 1852 110/87      Pulse (Heart Rate) 02/18/18 1628 (!) 128      Resp Rate 02/18/18 1628 28      Temp 02/18/18 1628 98 ??F (36.7 ??C)      Temp src --       O2 Sat (%) 02/18/18 1625 97 %      Weight 02/18/18 1628 186 lb      Height 02/18/18 1628 5' 10"       Constitutional: Obese woman, who is sitting up tripoding of the bed.  Appears to be in acute respiratory distress.Marland Kitchen  HEENT: Conjunctiva clear.  PERRLA. Mucous membranes moist, non-erythematous. Surface of the pharynx, palate, and tongue are pink, moist and without lesions.  Neck: supple, non tender, symmetrical, no masses or JVD.   Respiratory: lungs diffuse expiratory wheezing, and very poor air movement, with labored respirations.  And tachypnea and accessory muscle use.  Cardiovascular: heart tachycardic rate and rhythm without murmur rubs or gallops.   Calves soft and non-tender.  No peripheral edema or significant variscosities.  No signs of DVT.  Gastrointestinal:  Abdomen soft, mild midline tenderness secondary to incarcerated ventral hernia.  No guarding or rebound.  Musculoskeletal: Nail beds pink with prompt capillary refill  Integumentary: warm and dry without rashes or lesions  Neurologic: alert and oriented, Sensation intact, motor strength equal and symmetric.  No facial asymmetry or  dysarthria.        Impression and Management Plan   This is a new problem for this patient.  Patient who is in acute respiratory distress.  Impending respiratory failure.  We will obtain laboratory studies, chest x-ray, and provide nebulizer treatment.      Diagnostic Studies   Lab:   Recent Results (from the past 12 hour(s))   CBC WITH AUTOMATED DIFF    Collection Time: 02/18/18  5:15 PM   Result Value Ref Range    WBC 18.9 (H) 4.0 - 11.0 1000/mm3    RBC 5.05 3.60 - 5.20 M/uL    HGB 14.7 13.0 - 17.2 gm/dl    HCT 44.7 37.0 - 50.0 %    MCV 88.5 80.0 - 98.0 fL    MCH 29.1 25.4 - 34.6 pg    MCHC 32.9 30.0 - 36.0 gm/dl    PLATELET 336 140 - 450 1000/mm3    MPV 11.1 (H) 6.0 - 10.0 fL    RDW-SD 45.4 36.4 - 46.3      NRBC 0 0 - 0      IMMATURE GRANULOCYTES 0.5 0.0 - 3.0 %    NEUTROPHILS 91.3 (H) 34 - 64 %    LYMPHOCYTES 5.8 (L) 28 - 48 %    MONOCYTES 2.2 1 - 13 %  EOSINOPHILS 0.1 0 - 5 %    BASOPHILS 0.1 0 - 3 %   METABOLIC PANEL, COMPREHENSIVE    Collection Time: 02/18/18  5:15 PM   Result Value Ref Range    Sodium 136 136 - 145 mEq/L    Potassium 4.7 3.5 - 5.1 mEq/L    Chloride 102 98 - 107 mEq/L    CO2 24 21 - 32 mEq/L    Glucose 178 (H) 74 - 106 mg/dl    BUN 31 (H) 7 - 25 mg/dl    Creatinine 1.3 0.6 - 1.3 mg/dl    GFR est AA 53.0      GFR est non-AA 44      Calcium 9.5 8.5 - 10.1 mg/dl    AST (SGOT) 5 (L) 15 - 37 U/L    ALT (SGPT) 20 12 - 78 U/L    Alk. phosphatase 58 45 - 117 U/L    Bilirubin, total 0.5 0.2 - 1.0 mg/dl    Protein, total 8.3 (H) 6.4 - 8.2 gm/dl    Albumin 3.7 3.4 - 5.0 gm/dl    Anion gap 10 5 - 15 mmol/L   NT-PRO BNP    Collection Time: 02/18/18  5:15 PM   Result Value Ref Range    NT pro-BNP 7.0 0.0 - 125.0 pg/ml   TROPONIN I    Collection Time: 02/18/18  5:15 PM   Result Value Ref Range    Troponin-I <0.015 0.000 - 0.045 ng/ml   D DIMER    Collection Time: 02/18/18  5:15 PM   Result Value Ref Range    D DIMER 0.90 (H) 0.01 - 0.50 ug/mL (FEU)   POC BLOOD GAS + LACTIC ACID    Collection Time:  02/18/18  6:03 PM   Result Value Ref Range    pH 7.393 7.350 - 7.450      PCO2 36.8 35.0 - 45.0 mm Hg    PO2 174.0 (H) 75 - 100 mm Hg    BICARBONATE 22.5 18.0 - 26.0 mmol/L    O2 SAT 100.0 90 - 100 %    CO2, TOTAL 24.0 24 - 29 mmol/L    Lactic Acid 5.40 (HH) 0.40 - 2.00 mmol/L    BASE EXCESS -2 -2 - 3 mmol/L    Patient temp. 98.0      Sample type Art      FIO2 40      SITE R Radial      DEVICE BiPAP      ALLENS TEST Pass      IPAP/PIP 18      EPAP/CPAP/PEEP 8      Respiratory Rate 12      SET RATE 10      VTEXP 850     LACTIC ACID    Collection Time: 02/18/18  6:45 PM   Result Value Ref Range    Lactic Acid 3.9 (H) 0.4 - 2.0 mmol/L   GLUCOSE, POC    Collection Time: 02/18/18 10:55 PM   Result Value Ref Range    Glucose (POC) 182 (H) 65 - 105 mg/dL       Imaging:    Xr Chest Sngl V    Result Date: 02/18/2018  INDICATION: Respiratory failure   EXAMINATION: XR CHEST SNGL V COMPARISON: 02/04/2018 FINDINGS: The study shows a normal sized heart.. Stable 17 mm nodule at the left lower lobe.. Bullous emphysema.     IMPRESSION: 1. Stable 17 mm nodule at the left lower lobe.  2. Bullous emphysema.     Cta Chest W Or W Wo Cont    Result Date: 02/18/2018  EXAM: CTA CHEST W OR W WO CONT INDICATION: Respiratory failure, with elevated d-dimer..    TECHNIQUE: CTA scan of the chest with IV contrast including with 3D MIP Images including coronal and sagittal images. DICOM format image data is available to non-affiliated external healthcare facilities or entities on a secure, media free, reciprocally searchable basis with patient authorization for 12 months following the date of the study. COMPARISON: No relevant prior studies available. FINDINGS: Pulmonary arteries: Unremarkable. No pulmonary embolism. Aorta: No acute findings. No thoracic aortic aneurysm or dissection. Lungs: Stable 19 mm left lower lobe lung nodule. Bullous emphysema. Lymph nodes: Unremarkable. No enlarged lymph nodes. Esophagus: Normal Pleural spaces: Unremarkable. No  significant effusion. No pneumothorax. Heart: Unremarkable. No cardiomegaly. No significant pericardial effusion. No evidence of right ventricular dysfunction. Thyroid: Normal Chest wall: No abnormal findings. Bones: No fractures identified.     IMPRESSION: 1. No evidence of pulmonary embolism. 2. Stable 19 mm left lower lobe lung nodule. 3. Bullous emphysema.     Ct Abd Pelv W Cont    Result Date: 02/18/2018  EXAM: CT ABD PELV W CONT INDICATION: Worsening abdominal pain, with history of incarcerated ventral hernia.    TECHNIQUE: Axial CT scan of the abdomen and pelvis with   IV contrast    including coronal and sagittal reconstructions.  DICOM format image data is available to non-affiliated external healthcare facilities or entities on a secure, media free, reciprocally searchable basis with patient authorization for 12 months following the date of the study. COMPARISON: 01/29/2018. FINDINGS: ABDOMEN: Liver: Unremarkable. No mass. Gallbladder and bile ducts: Unremarkable. No calcified stones. No ductal dilatation. Pancreas: Unremarkable. No ductal dilatation. No mass. Spleen: Unchanged splenic lesions. Adrenals: Unremarkable. No mass. Kidneys and ureters: Unremarkable. No hydronephrosis. No solid mass. Appendix: No findings to suggest appendicitis. Stomach and bowel: Stomach unremarkable. No obstruction. No mucosal thickening. No inflammatory changes. Multiple sigmoid diverticula without diverticulitis. Peritoneum: Unremarkable. No significant fluid collection. No free air. Lymph nodes: Unremarkable. No enlarged lymph nodes. Vasculature: Unremarkable. No aortic aneurysm. Bones: No acute fracture. Abdominal wall: Unchanged fat and fluid containing ventral hernia. PELVIS: Bladder: Unremarkable. No mass. Reproductive: Unremarkable as visualized.     IMPRESSION: 1. Multiple sigmoid diverticula without diverticulitis. 2. Unchanged fat and fluid containing ventral hernia. 3. Unchanged splenic lesions.       Ekg: On my  reading EKG shows sinus tachycardia at 114 bpm, no ectopy is noted, no ischemic changes are appreciated.  ED Course/Medical Decision Making     Patient remained hemodynamically stable.  However she was in respiratory distress upon arrival.  Patient was placed on BiPAP, given nebulizer treatments.  The seem to help considerably.  In addition she was given a dose of Solu-Medrol.    I believe elevated white blood cell counts most likely secondary to the steroids the patient is chronically on.    Patient also noted to have elevated lactic acid.  At this point I see no signs of sepsis.  Lactic acid is trending down.  We will hydrate patient and recheck.    CT scan of chest is negative for pulmonary embolism.    CT scan of abdomen and pelvis show no acute findings.    I have reviewed the imaging personally, and agree with the readings above.    Patient was discussed with Dr. Tonye Royalty, who evaluated patient in the emergency department.  Agrees to admit to stepdown.      Critical care:  Critical care time excluding procedures, but including direct patient care, reviewing medical records, evaluating results of diagnostic testing, discussions with family members, and consulting with physicians: 40 minutes    Medications   albuterol-ipratropium (DUO-NEB) 2.5 MG-0.5 MG/3 ML (has no administration in time range)   cloNIDine HCl (CATAPRES) tablet 0.3 mg (0 mg Oral Held 02/19/18 0000)   dilTIAZem CD (CARDIZEM CD) capsule 180 mg (has no administration in time range)   gabapentin (NEURONTIN) capsule 100 mg (100 mg Oral Given 02/18/18 2340)   losartan (COZAAR) tablet 100 mg (has no administration in time range)   magnesium oxide (MAG-OX) tablet 400 mg (has no administration in time range)   montelukast (SINGULAIR) tablet 10 mg (10 mg Oral Given 02/18/18 2340)   QUEtiapine SR (SEROquel XR) tablet 150 mg (150 mg Oral Given 02/18/18 2340)   rosuvastatin (CRESTOR) tablet 5 mg (5 mg Oral Given 02/18/18 2340)   spironolactone (ALDACTONE) tablet  100 mg (has no administration in time range)   tiotropium bromide (SPIRIVA RESPIMAT) 2.5 mcg /actuation (has no administration in time range)   traZODone (DESYREL) tablet 50 mg (50 mg Oral Given 02/18/18 2340)   fluticasone propionate (FLONASE) 50 mcg/actuation nasal spray 2 Spray (has no administration in time range)   sodium chloride (NS) flush 5-10 mL (10 mL IntraVENous Given 02/18/18 2345)   sodium chloride (NS) flush 5-10 mL (10 mL IntraVENous Given 02/18/18 2341)   naloxone (NARCAN) injection 0.1 mg (has no administration in time range)   acetaminophen (TYLENOL) tablet 650 mg (has no administration in time range)   oxyCODONE-acetaminophen (PERCOCET) 5-325 mg per tablet 1 Tab (1 Tab Oral Given 02/18/18 2340)   heparin (porcine) injection 5,000 Units (5,000 Units SubCUTAneous Given 02/18/18 2340)   methylPREDNISolone (PF) (SOLU-MEDROL) injection 40 mg (has no administration in time range)   fluticasone-vilanterol (BREO ELLIPTA) 117mg-25mcg/puff (has no administration in time range)   albuterol-ipratropium (DUO-NEB) 2.5 MG-0.5 MG/3 ML (3 mL Nebulization Given 02/18/18 1711)   methylPREDNISolone (PF) (Solu-MEDROL) injection 125 mg (125 mg IntraVENous Given 02/18/18 1739)   albuterol (PROVENTIL VENTOLIN) nebulizer solution 2.5 mg (2.5 mg Nebulization Given 02/18/18 1935)   sodium chloride 0.9 % bolus infusion 1,000 mL (0 mL IntraVENous IV Completed 02/18/18 2205)   iopamidol (ISOVUE-370) 76 % injection 90 mL (90 mL IntraVENous Given 02/18/18 2232)   sodium chloride (NS) flush 5-10 mL (10 mL IntraVENous Given 02/18/18 2243)     Final Diagnosis     1. Acute respiratory failure, unspecified whether with hypoxia or hypercapnia ([place]    2. COPD exacerbation (HCC)    3. Abdominal pain, unspecified abdominal location    4. Lactic acidosis        Disposition   Admission    Current Discharge Medication List        ROneita Hurt M.D. [facility] February 18, 2018    My signature above authenticates this document and my orders, the final  ??  diagnosis (es), discharge prescription (s), and instructions in the Epic ??  record.  If you have any questions please contact ([phone]  ??  Nursing notes have been reviewed by the physician/ advanced practice ??  Clinician.    This chart was dictated using [employer] analyst Inadvertent errors may be present.

## 2018-02-19 LAB — CBC
Hematocrit: 37.6 % (ref 37.0–50.0)
Hemoglobin: 12.3 gm/dl — ABNORMAL LOW (ref 13.0–17.2)
MCH: 29.2 pg (ref 25.4–34.6)
MCHC: 32.7 gm/dl (ref 30.0–36.0)
MCV: 89.3 fL (ref 80.0–98.0)
MPV: 11.1 fL — ABNORMAL HIGH (ref 6.0–10.0)
Platelets: 249 10*3/uL (ref 140–450)
RBC: 4.21 M/uL (ref 3.60–5.20)
RDW-SD: 45.1 (ref 36.4–46.3)
WBC: 17 10*3/uL — ABNORMAL HIGH (ref 4.0–11.0)

## 2018-02-19 LAB — LIPID PANEL
CHOL/HDL Ratio: 2.1 Ratio (ref 0.0–4.4)
Chol/HDL Ratio: 2.1 Ratio (ref 0.0–4.4)
Cholesterol, Total: 123 mg/dl — ABNORMAL LOW (ref 140–199)
Cholesterol, total: 123 mg/dl — ABNORMAL LOW (ref 140–199)
HDL Cholesterol: 58 mg/dl (ref 40–96)
HDL: 58 mg/dl (ref 40–96)
LDL Calculated: 52 mg/dl (ref 0–130)
LDL, calculated: 52 mg/dl (ref 0–130)
Triglyceride: 66 mg/dl (ref 29–150)
Triglycerides: 66 mg/dl (ref 29–150)

## 2018-02-19 LAB — HEMOGLOBIN A1C W/O EAG
Hemoglobin A1C: 7.4 % — ABNORMAL HIGH (ref 4.2–6.3)
Hemoglobin A1c: 7.4 % — ABNORMAL HIGH (ref 4.2–6.3)

## 2018-02-19 LAB — COMPREHENSIVE METABOLIC PANEL
ALT: 15 U/L (ref 12–78)
AST: 3 U/L — ABNORMAL LOW (ref 15–37)
Albumin: 3.1 gm/dl — ABNORMAL LOW (ref 3.4–5.0)
Alkaline Phosphatase: 49 U/L (ref 45–117)
Anion Gap: 10 mmol/L (ref 5–15)
BUN: 37 mg/dl — ABNORMAL HIGH (ref 7–25)
CO2: 22 mEq/L (ref 21–32)
Calcium: 8.3 mg/dl — ABNORMAL LOW (ref 8.5–10.1)
Chloride: 102 mEq/L (ref 98–107)
Creatinine: 1.2 mg/dl (ref 0.6–1.3)
EGFR IF NonAfrican American: 48
GFR African American: 58
Glucose: 330 mg/dl — ABNORMAL HIGH (ref 74–106)
Potassium: 4.7 mEq/L (ref 3.5–5.1)
Sodium: 134 mEq/L — ABNORMAL LOW (ref 136–145)
Total Bilirubin: 0.4 mg/dl (ref 0.2–1.0)
Total Protein: 6.8 gm/dl (ref 6.4–8.2)

## 2018-02-19 LAB — POCT GLUCOSE
POC Glucose: 182 mg/dL — ABNORMAL HIGH (ref 65–105)
POC Glucose: 233 mg/dL — ABNORMAL HIGH (ref 65–105)
POC Glucose: 355 mg/dL — ABNORMAL HIGH (ref 65–105)
POC Glucose: 325 mg/dL — ABNORMAL HIGH (ref 65–105)

## 2018-02-19 LAB — TSH 3RD GENERATION
TSH: 0.149 u[IU]/mL — ABNORMAL LOW (ref 0.358–3.740)
TSH: 0.149 u[IU]/mL — ABNORMAL LOW (ref 0.358–3.740)

## 2018-02-19 LAB — METABOLIC PANEL, COMPREHENSIVE
ALT (SGPT): 15 U/L (ref 12–78)
AST (SGOT): 3 U/L — ABNORMAL LOW (ref 15–37)
Albumin: 3.1 gm/dl — ABNORMAL LOW (ref 3.4–5.0)
Alk. phosphatase: 49 U/L (ref 45–117)
Anion gap: 10 mmol/L (ref 5–15)
BUN: 37 mg/dl — ABNORMAL HIGH (ref 7–25)
Bilirubin, total: 0.4 mg/dl (ref 0.2–1.0)
CO2: 22 mEq/L (ref 21–32)
Calcium: 8.3 mg/dl — ABNORMAL LOW (ref 8.5–10.1)
Chloride: 102 mEq/L (ref 98–107)
Creatinine: 1.2 mg/dl (ref 0.6–1.3)
GFR est AA: 58
GFR est non-AA: 48
Glucose: 330 mg/dl — ABNORMAL HIGH (ref 74–106)
Potassium: 4.7 mEq/L (ref 3.5–5.1)
Protein, total: 6.8 gm/dl (ref 6.4–8.2)
Sodium: 134 mEq/L — ABNORMAL LOW (ref 136–145)

## 2018-02-19 LAB — CBC W/O DIFF
HCT: 37.6 % (ref 37.0–50.0)
HGB: 12.3 gm/dl — ABNORMAL LOW (ref 13.0–17.2)
MCH: 29.2 pg (ref 25.4–34.6)
MCHC: 32.7 gm/dl (ref 30.0–36.0)
MCV: 89.3 fL (ref 80.0–98.0)
MPV: 11.1 fL — ABNORMAL HIGH (ref 6.0–10.0)
PLATELET: 249 10*3/uL (ref 140–450)
RBC: 4.21 M/uL (ref 3.60–5.20)
RDW-SD: 45.1 (ref 36.4–46.3)
WBC: 17 10*3/uL — ABNORMAL HIGH (ref 4.0–11.0)

## 2018-02-19 LAB — GLUCOSE, POC
Glucose (POC): 182 mg/dL — ABNORMAL HIGH (ref 65–105)
Glucose (POC): 233 mg/dL — ABNORMAL HIGH (ref 65–105)
Glucose (POC): 355 mg/dL — ABNORMAL HIGH (ref 65–105)
Glucose (POC): 325 mg/dL — ABNORMAL HIGH (ref 65–105)

## 2018-02-19 MED ORDER — LOSARTAN 50 MG TAB
50 mg | Freq: Every day | ORAL | Status: DC
Start: 2018-02-19 — End: 2018-02-19
  Administered 2018-02-19: 12:00:00 via ORAL

## 2018-02-19 MED ORDER — CLONIDINE 0.1 MG TAB
0.1 mg | Freq: Two times a day (BID) | ORAL | Status: DC
Start: 2018-02-19 — End: 2018-02-19
  Administered 2018-02-19: 12:00:00 via ORAL

## 2018-02-19 MED ORDER — TIOTROPIUM BROMIDE 2.5 MCG/ACTUATION MIST FOR INHALATION
2.5 mcg/actuation | Freq: Every day | RESPIRATORY_TRACT | Status: DC
Start: 2018-02-19 — End: 2018-02-24
  Administered 2018-02-19 – 2018-02-24 (×6): via RESPIRATORY_TRACT

## 2018-02-19 MED ORDER — INSULIN LISPRO 100 UNIT/ML INJECTION
100 unit/mL | SUBCUTANEOUS | Status: DC | PRN
Start: 2018-02-19 — End: 2018-02-24
  Administered 2018-02-20 – 2018-02-24 (×9): via SUBCUTANEOUS

## 2018-02-19 MED ORDER — DEXTROSE 50% IN WATER (D50W) IV SYRG
INTRAVENOUS | Status: DC | PRN
Start: 2018-02-19 — End: 2018-02-24

## 2018-02-19 MED ORDER — NALOXONE 0.4 MG/ML INJECTION
0.4 mg/mL | INTRAMUSCULAR | Status: DC | PRN
Start: 2018-02-19 — End: 2018-02-24

## 2018-02-19 MED ORDER — CLONIDINE 0.2 MG TAB
0.2 mg | Freq: Two times a day (BID) | ORAL | Status: DC
Start: 2018-02-19 — End: 2018-02-21
  Administered 2018-02-20 – 2018-02-21 (×3): via ORAL

## 2018-02-19 MED ORDER — HEPARIN (PORCINE) 5,000 UNIT/ML IJ SOLN
5000 unit/mL | Freq: Two times a day (BID) | INTRAMUSCULAR | Status: DC
Start: 2018-02-19 — End: 2018-02-24
  Administered 2018-02-19 – 2018-02-24 (×12): via SUBCUTANEOUS

## 2018-02-19 MED ORDER — LOSARTAN 50 MG TAB
50 mg | Freq: Every day | ORAL | Status: DC
Start: 2018-02-19 — End: 2018-02-21
  Administered 2018-02-20: 15:00:00 via ORAL

## 2018-02-19 MED ORDER — OXYCODONE-ACETAMINOPHEN 5 MG-325 MG TAB
5-325 mg | Freq: Four times a day (QID) | ORAL | Status: DC | PRN
Start: 2018-02-19 — End: 2018-02-24
  Administered 2018-02-19 – 2018-02-24 (×14): via ORAL

## 2018-02-19 MED ORDER — GABAPENTIN 100 MG CAP
100 mg | Freq: Three times a day (TID) | ORAL | Status: DC
Start: 2018-02-19 — End: 2018-02-24
  Administered 2018-02-19 – 2018-02-24 (×17): via ORAL

## 2018-02-19 MED ORDER — ACETAMINOPHEN 325 MG TABLET
325 mg | Freq: Four times a day (QID) | ORAL | Status: DC | PRN
Start: 2018-02-19 — End: 2018-02-24
  Administered 2018-02-20: 02:00:00 via ORAL

## 2018-02-19 MED ORDER — MORPHINE 2 MG/ML INJECTION
2 mg/mL | INTRAMUSCULAR | Status: DC | PRN
Start: 2018-02-19 — End: 2018-02-18
  Administered 2018-02-19: 02:00:00 via INTRAVENOUS

## 2018-02-19 MED ORDER — GLUCAGON 1 MG INJECTION
1 mg | INTRAMUSCULAR | Status: DC | PRN
Start: 2018-02-19 — End: 2018-02-24

## 2018-02-19 MED ORDER — SPIRONOLACTONE 25 MG TAB
25 mg | Freq: Every day | ORAL | Status: DC
Start: 2018-02-19 — End: 2018-02-24
  Administered 2018-02-20 – 2018-02-24 (×4): via ORAL

## 2018-02-19 MED ORDER — FLUTICASONE 100 MCG-VILANTEROL 25 MCG/DOSE BREATH ACTIVATED INHALER
100-25 mcg/dose | Freq: Every day | RESPIRATORY_TRACT | Status: DC
Start: 2018-02-19 — End: 2018-02-24
  Administered 2018-02-19 – 2018-02-24 (×6): via RESPIRATORY_TRACT

## 2018-02-19 MED ORDER — MONTELUKAST 10 MG TAB
10 mg | Freq: Every evening | ORAL | Status: DC
Start: 2018-02-19 — End: 2018-02-24
  Administered 2018-02-19 – 2018-02-24 (×6): via ORAL

## 2018-02-19 MED ORDER — FLUTICASONE 50 MCG/ACTUATION NASAL SPRAY, SUSP
50 mcg/actuation | Freq: Every day | NASAL | Status: DC
Start: 2018-02-19 — End: 2018-02-24
  Administered 2018-02-19 – 2018-02-24 (×6): via NASAL

## 2018-02-19 MED ORDER — SODIUM CHLORIDE 0.9 % IV
1252 mg/2 mL | Freq: Three times a day (TID) | INTRAVENOUS | Status: DC
Start: 2018-02-19 — End: 2018-02-18

## 2018-02-19 MED ORDER — FLUTICASONE-SALMETEROL 100 MCG-50 MCG/DOSE DISK DEVICE FOR INHALATION
100-50 mcg/dose | Freq: Two times a day (BID) | RESPIRATORY_TRACT | Status: DC
Start: 2018-02-19 — End: 2018-02-18

## 2018-02-19 MED ORDER — SODIUM CHLORIDE 0.9 % IJ SYRG
INTRAMUSCULAR | Status: DC | PRN
Start: 2018-02-19 — End: 2018-02-24
  Administered 2018-02-19: 04:00:00 via INTRAVENOUS

## 2018-02-19 MED ORDER — INSULIN LISPRO 100 UNIT/ML INJECTION
100 unit/mL | Freq: Four times a day (QID) | SUBCUTANEOUS | Status: DC
Start: 2018-02-19 — End: 2018-02-24
  Administered 2018-02-19 – 2018-02-24 (×18): via SUBCUTANEOUS

## 2018-02-19 MED ORDER — ROSUVASTATIN 10 MG TAB
10 mg | Freq: Every evening | ORAL | Status: DC
Start: 2018-02-19 — End: 2018-02-24
  Administered 2018-02-19 – 2018-02-24 (×6): via ORAL

## 2018-02-19 MED ORDER — TRAZODONE 50 MG TAB
50 mg | Freq: Every evening | ORAL | Status: DC | PRN
Start: 2018-02-19 — End: 2018-02-24
  Administered 2018-02-19 – 2018-02-21 (×2): via ORAL

## 2018-02-19 MED ORDER — IPRATROPIUM-ALBUTEROL 2.5 MG-0.5 MG/3 ML NEB SOLUTION
2.5 mg-0.5 mg/3 ml | RESPIRATORY_TRACT | Status: DC
Start: 2018-02-19 — End: 2018-02-21
  Administered 2018-02-19 – 2018-02-21 (×12): via RESPIRATORY_TRACT

## 2018-02-19 MED ORDER — METHYLPREDNISOLONE (PF) 40 MG/ML IJ SOLR
40 mg/mL | Freq: Three times a day (TID) | INTRAMUSCULAR | Status: DC
Start: 2018-02-19 — End: 2018-02-20
  Administered 2018-02-19 – 2018-02-20 (×4): via INTRAVENOUS

## 2018-02-19 MED ORDER — DILTIAZEM ER 180 MG 24 HR CAP
180 mg | Freq: Every day | ORAL | Status: DC
Start: 2018-02-19 — End: 2018-02-24
  Administered 2018-02-19 – 2018-02-24 (×6): via ORAL

## 2018-02-19 MED ORDER — QUETIAPINE SR 50 MG 24 HR TAB
50 mg | Freq: Every evening | ORAL | Status: DC
Start: 2018-02-19 — End: 2018-02-24
  Administered 2018-02-19 – 2018-02-24 (×6): via ORAL

## 2018-02-19 MED ORDER — SODIUM CHLORIDE 0.9 % IJ SYRG
Freq: Three times a day (TID) | INTRAMUSCULAR | Status: DC
Start: 2018-02-19 — End: 2018-02-24
  Administered 2018-02-19 – 2018-02-24 (×14): via INTRAVENOUS

## 2018-02-19 MED ORDER — MAGNESIUM OXIDE 400 MG TAB
400 mg | Freq: Every day | ORAL | Status: DC
Start: 2018-02-19 — End: 2018-02-24
  Administered 2018-02-19 – 2018-02-24 (×6): via ORAL

## 2018-02-19 MED ORDER — INSULIN GLARGINE 100 UNIT/ML INJECTION
100 unit/mL | Freq: Every evening | SUBCUTANEOUS | Status: DC
Start: 2018-02-19 — End: 2018-02-24
  Administered 2018-02-20 – 2018-02-24 (×5): via SUBCUTANEOUS

## 2018-02-19 MED FILL — SOLU-MEDROL (PF) 40 MG/ML SOLUTION FOR INJECTION: 40 mg/mL | INTRAMUSCULAR | Qty: 1

## 2018-02-19 MED FILL — MONTELUKAST 10 MG TAB: 10 mg | ORAL | Qty: 1

## 2018-02-19 MED FILL — BD POSIFLUSH NORMAL SALINE 0.9 % INJECTION SYRINGE: INTRAMUSCULAR | Qty: 10

## 2018-02-19 MED FILL — HEPARIN (PORCINE) 5,000 UNIT/ML IJ SOLN: 5000 unit/mL | INTRAMUSCULAR | Qty: 1

## 2018-02-19 MED FILL — ROSUVASTATIN 5 MG TAB: 5 mg | ORAL | Qty: 1

## 2018-02-19 MED FILL — GABAPENTIN 100 MG CAP: 100 mg | ORAL | Qty: 1

## 2018-02-19 MED FILL — IPRATROPIUM-ALBUTEROL 2.5 MG-0.5 MG/3 ML NEB SOLUTION: 2.5 mg-0.5 mg/3 ml | RESPIRATORY_TRACT | Qty: 3

## 2018-02-19 MED FILL — LOSARTAN 50 MG TAB: 50 mg | ORAL | Qty: 2

## 2018-02-19 MED FILL — OXYCODONE-ACETAMINOPHEN 5 MG-325 MG TAB: 5-325 mg | ORAL | Qty: 1

## 2018-02-19 MED FILL — CLONIDINE 0.1 MG TAB: 0.1 mg | ORAL | Qty: 1

## 2018-02-19 MED FILL — MORPHINE 2 MG/ML INJECTION: 2 mg/mL | INTRAMUSCULAR | Qty: 1

## 2018-02-19 MED FILL — SPIRONOLACTONE 100 MG TAB: 100 mg | ORAL | Qty: 1

## 2018-02-19 MED FILL — SPIRIVA RESPIMAT 2.5 MCG/ACTUATION SOLUTION FOR INHALATION: 2.5 mcg/actuation | RESPIRATORY_TRACT | Qty: 4

## 2018-02-19 MED FILL — MAGNESIUM OXIDE 400 MG TAB: 400 mg | ORAL | Qty: 1

## 2018-02-19 MED FILL — BREO ELLIPTA 100 MCG-25 MCG/DOSE POWDER FOR INHALATION: 100-25 mcg/dose | RESPIRATORY_TRACT | Qty: 28

## 2018-02-19 MED FILL — DILTIAZEM ER 180 MG 24 HR CAP: 180 mg | ORAL | Qty: 1

## 2018-02-19 MED FILL — QUETIAPINE SR 50 MG 24 HR TAB: 50 mg | ORAL | Qty: 3

## 2018-02-19 MED FILL — SPIRONOLACTONE 25 MG TAB: 25 mg | ORAL | Qty: 4

## 2018-02-19 MED FILL — TRAZODONE 100 MG TAB: 100 mg | ORAL | Qty: 1

## 2018-02-19 MED FILL — FLUTICASONE 50 MCG/ACTUATION NASAL SPRAY, SUSP: 50 mcg/actuation | NASAL | Qty: 16

## 2018-02-19 NOTE — Progress Notes (Signed)
Code R Consult Received.    After Hours Consult:    No    Last admission dc date and dx       02/08/18     Copd     What was the discharge plan    Home     Now presents       SOB    Case Discussed with ED Physician:   N    Case Discussed with Attending Physician:    N    Appropriate for Admission:      Yes inpatient       Anticipated Discharge Needs:    Home    Disposition if discharged from the ED          Readmission interview done (epic)     N    Readmission assessment completed (compare AD)   Y    [facility] complete Y

## 2018-02-19 NOTE — Progress Notes (Signed)
Patient refused bipap at this time she is on 2 lpm nc sat 100., hr 71, she is alert and awake, RN is aware

## 2018-02-19 NOTE — Other (Signed)
[facility] Care  Face to Face Encounter    Patient???s Name: [name] [name]           Date of Birth: 01-May-1951    Primary Diagnosis: COPD exacerbation (HCC) [J44.1]                    Admit Date: 02/18/2018    Date of Face to Face:  February 19, 2018                    Medical Record Number: [id number]     Attending: Lawana Chambers, MD      Physician Attestation:  To be filled out by physician who conducted Face-to Face encounter.    Current Problem List:  The encounter with the patient was in whole, or in part, for the following medical condition, which is the primary reason home health care (list medical condition):    Patient Active Hospital Problem List:   COPD exacerbation (HCC) (11/14/2017)        COPD with acute exacerbation  Lactic acidosis  Anterior abdominal wall incarcerated fat hernia--Follow up with Dr. Montez Morita for surgery plans  Insulin-dependent diabetes mellitus type 2 uncontrolled with hyperglycemia  Hypertension  Hyperlipidemia  Peripheral neuropathy  GERD  Chronic pain syndrome  History hepatitis C                          Face to Face Encounter findings are related to primary reason for home care:   yes.     1. I certify that the patient needs intermittent care as follows: skilled nursing care:  teaching/training of disease process, medications, and diet, skilled observation/assessment, patient education, complex care plan management, therapeutic drug monitoring and rehabilitative nursing  physical therapy: strengthening, stretching/ROM, transfer training, gait/stair training, balance training and pt/caregiver education  occupational therapy:  ADL safety (ie. cooking, bathing, dressing), ROM and pt/caregiver education  speech therapy:  oral motor development, swallowing education and pt/caregiver education    2. I certify that this patient is homebound, that is: 1) patient requires the use of a cane and walker device, special transportation, or assistance  of another to leave the home; or 2) patient's condition makes leaving the home medically contraindicated; and 3) patient has a normal inability to leave the home and leaving the home requires considerable and taxing effort.  Patient may leave the home for infrequent and short duration for medical reasons, and occasional absences for non-medical reasons. Homebound status is due to the following functional limitations: Patient with strength deficits limiting the performance of all ADL's without caregiver assistance or the use of an assistive device. Patient with increased shortness of breath with all exertional activity limiting ambulation outside the home.    3. I certify that this patient is under my care and that I, or a nurse practitioner or physician???s assistant, or clinical nurse specialist, or certified nurse midwife, working with me, had a Face-to-Face Encounter that meets the physician Face-to-Face Encounter requirements.  The following are the clinical findings from the Face-to-Face encounter that support the need for skilled services and is a summary of the encounter:     See discharge summary and summary of the patient's illness    Electronically signed:  Arlie Solomons, RN  02/19/2018      THE FOLLOWING TO BE COMPLETED BY THE  PHYSICIAN:    I concur with the findings described above from the F2F encounter  that this patient is homebound and in need of a skilled service.    Electronically signed: Lawana Chambers, MD   Certifying Physician:

## 2018-02-19 NOTE — Progress Notes (Signed)
Patient admitted on 02/18/2018 from home with   Chief Complaint   Patient presents with   ??? Respiratory Distress    The patient is being treated for COPD Exacerbation and Anterior abdominal wall incarcerated fat hernia- followed by Dr. Montez Morita.    The patient has been admitted to the hospital 6 times in the past 12 months. The referral has been made to the Transitional Nurse Navigator.    Tentative dc plan: Home with family assistance, physician follow up, and home health RN, OT, PT, SLP, [facility] aide, and MSW with [facility]. The patient may need O2 at home and will need to be tested prior to DC. The face to face for home health has been signed by Dr. Josephine Igo. The referral for home health has been emailed to [facility] and the agency liaison, Trinda Pascal. If SNF is needed at DC the choice facility is [facility] and [address]. The patient will be loaded in e-dc and sent to area SNFs for backup disposition.     Anticipated Discharge Date: 3-4 days pending progress    PCP: [name], PA-C     Specialists: Dr. Montez Morita- Surgery and Dr. Trina Ao    Dialysis Unit: NA    Pharmacy: [facility] and [facility]    DME: cane, walker, shower chair, BSC, stair lift, and Nebulizer    Home Environment: Lives at [address]  [place] [address]. Lives with her husband in a 2 story townhome with 1 stoop entry step and upstairs bedroom. Her husband assists with ADLs as needed. They both state she is not able to leave the house too often due to SOB or weakness or a combination.     Prior to admission open services: None at this time, previously open to [facility] but was Dc'd on 7/30. Patient and husband state that they have been to the hospital since then in August and home health was not resumed at that time.     Home Health Agency-  Personal Care Agency-    Extended Emergency Contact Information  Primary Emergency Contact: Goodwin,Bobby  Address: [address]            [place], [place] [address] [place]  Home Phone: [phone]  Mobile Phone: [phone]  Relation: Spouse     Transportation: family will transport home    Therapy Recommendations:    OT = pending    PT = pending    SLP =      RT Home O2 Evaluation =  Will need evaluation 24-48 hours prior to discharge    Wound Care =  NA    [facility] Consulted: No    Case Management Assessment        Preferred Language for Healthcare Related Communication         Spiritual/Ethnic/Cultural/Religious Needs that Should be Incorporated Into Your Care          FUNCTIONAL ASSESSMENT   Fall in Past 12 Months: No               Decline in Gait/Transfer/Balance: No       Decline in Capacity to Feed/Dress/Bathe: No   Developmental Delay: No   Chewing/Swallowing Problems: No      DYSPHAGIA SCREENING                          Difficulty with Secretions  Difficulty with Secretions: No      Speech Slurred/Thick/Garbled     Speech Slurred/Thick/Garbled: No      ABUSE/NEGLECT SCREENING   Physical Abuse/Neglect: Denies   Sexual Abuse: Denies   Sexual Abuse: Denies   Other Abuse/Issues: Denies          PRIMARY DECISION MAKER                                        ADVANCE CARE PLANNING (ACP) DOCUMENTS   Confirm Advance Directive: Yes, not on file              Suicide/Psychosocial Screening   Primary Diagnosis or Primary Complaint of an Emotional Behavior Disorder: No   Patient is Currently Experiencing Depression: No   Suicidal Ideation/Attempts: No   Homicidal Ideation/Attempts: No   Alcohol/Drug Intoxication: No   Hallucinations/Delusions: No   Pending, Active, or Temporary Detention Orders: No   Aggressive/Inappropriate Behavior: No      SAD PERSONS                                                  READMIT RISK TOOL   Support Systems: Friends \\ neighbors   Relationship with Primary Physician Group: Seen at least one time within the past 6 months   History of Falls Within Past 3 Months: No    Needs Assistance with Wound Care AND/OR Mgnt of O2, Nebulizer: No   Requires Financial, Physical and/or Educational Assistance With Medications: No   History of Mental Illness: Yes   Living Alone: No      CARE MANAGEMENT INTERVENTIONS   Readmission Interview Completed: Yes   PCP Verified by CM: Yes           Mode of Transport at Discharge: Self       Transition of Care Consult (CM Consult): Discharge Planning, Home Health, SNF                               Current Support Network: Lives with Spouse   Reason for Referral: DCP Rounds   History Provided By: Patient, Medical Record, Spouse   Patient Orientation: Alert and Oriented, Person, Place, Situation, Self   Cognition: Alert   Support System Response: Concerned, Cooperative   Previous Living Arrangement: Lives with Family Dependent   Home Accessibility: Steps, Multi Level Home(1 stoop step to enter the home)   Prior Functional Level: Assistance with the following:, Bathing, Dressing, Housework, Cooking, Mobility, Shopping   Current Functional Level: Assistance with the following:, Bathing, Dressing, Housework, Cooking, Mobility, Toileting, Shopping           Ability to make needs known:: Fair   Family able to assist with home care needs:: Yes               Types of Needs Identified: Disease Management Education, Treatment Education, ADLs/IADLs, Functions Dependency Needs, Nutrition/Diet, Emotional Support, Support System/Social, Activity/Exercise   Anticipated Discharge Needs: Home Health Services, Skilled Nursing Facility, TCC           Plan discussed with Pt/Family/Caregiver: Yes   Freedom of Choice Offered: Yes      DISCHARGE LOCATION   Discharge Placement: Home with home health

## 2018-02-19 NOTE — Progress Notes (Addendum)
0905- Patient has known history of diabetes with A1C of 7.4. No glucommander orders in Epic. Glucose with morning labs 330. POC glucose @ 0905 233. Dr. Mehmood paged.    1018- BP 86/60 (66) after morning medications. Patient is asymptomatic. Per husband, primary physician recently instructed patient to take 50mg Losartan instead of 100mg at office due to low BP. Dr. Mehmood paged.
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
## 2018-02-19 NOTE — Progress Notes (Signed)
Medicine Progress Note    Date of Admission: 02/18/2018  Length of Stay: 1    Assessment And Plan     COPD with acute exacerbation  Lactic acidosis  Anterior abdominal wall incarcerated fat hernia  Insulin-dependent diabetes mellitus type 2 uncontrolled with hyperglycemia  Hypertension  Hyperlipidemia  Peripheral neuropathy  GERD  Chronic pain syndrome  History hepatitis C    Treatment plan  Patient admitted to hospital with severe COPD except patient was placed on BiPAP.  Currently off BiPAP.  Still has shortness of breath.  Continue steroids, antibiotics and nebs.  Patient also has abdominal wall hernia.  She was supposed to see Dr. Eulas Post tomorrow.  Discussed with Dr. Eulas Post and he is very kind  to see patient in the hospital.  Patient will be started on insulin Lantus along with sliding scale for diabetes.  Decrease clonidine to 0.2 mg.  Blood pressure is on lower side.  Continue home medications.  Patient be transferred to telemetry floor.  Plan discussed with patient and husband.    Unit floortime 35 minutes, more than half time spent in counseling and coordination of care      Discussed with patient. Updates regarding diagnose, tests results, prognosis, treatment  of the patient's medical conditions discussed in detail     Subjective:     Shortness of breath slightly better.  Some chronic abdominal pain.  Feeling weak.  No chest pain.     Objective:     Visit Vitals  BP (!) 89/62   Pulse 100   Temp 98.2 ??F (36.8 ??C)   Resp 19   Ht 5' 8"  (1.727 m)   Wt 83.6 kg (184 lb 4.9 oz)   SpO2 100%   Breastfeeding? No   BMI 28.02 kg/m??         Intake/Output Summary (Last 24 hours) at 02/19/2018 1250  Last data filed at 02/19/2018 0958  Gross per 24 hour   Intake 1480 ml   Output 500 ml   Net 980 ml     Constitutional:??Awake and alert, NAD  HENT:??atraumatic, normocephalic, oropharynx clear ??  Eyes:??????conjunctiva normal  Neck:??????supple and trachea normal  Cardiovascular:?? Regular rate and rhythm, heart sounds normal, intact  distal pulses  Pulmonary/Chest Wall: Bilateral wheezing  Abdominal:????????????appearance normal, soft, non-tender upon palpation,bowel sounds normal.  Neurological:??????awake, alert and oriented, CN intact, no focal neuro deficits, moves all extremities equally .  Extremities:???? No clubbing or cyanosis. Pedal pulses 2+ b/l.  Capillary refill normal.Skin intact      Recent Results (from the past 24 hour(s))   CBC WITH AUTOMATED DIFF    Collection Time: 02/18/18  5:15 PM   Result Value Ref Range    WBC 18.9 (H) 4.0 - 11.0 1000/mm3    RBC 5.05 3.60 - 5.20 M/uL    HGB 14.7 13.0 - 17.2 gm/dl    HCT 44.7 37.0 - 50.0 %    MCV 88.5 80.0 - 98.0 fL    MCH 29.1 25.4 - 34.6 pg    MCHC 32.9 30.0 - 36.0 gm/dl    PLATELET 336 140 - 450 1000/mm3    MPV 11.1 (H) 6.0 - 10.0 fL    RDW-SD 45.4 36.4 - 46.3      NRBC 0 0 - 0      IMMATURE GRANULOCYTES 0.5 0.0 - 3.0 %    NEUTROPHILS 91.3 (H) 34 - 64 %    LYMPHOCYTES 5.8 (L) 28 - 48 %    MONOCYTES 2.2 1 -  13 %    EOSINOPHILS 0.1 0 - 5 %    BASOPHILS 0.1 0 - 3 %   METABOLIC PANEL, COMPREHENSIVE    Collection Time: 02/18/18  5:15 PM   Result Value Ref Range    Sodium 136 136 - 145 mEq/L    Potassium 4.7 3.5 - 5.1 mEq/L    Chloride 102 98 - 107 mEq/L    CO2 24 21 - 32 mEq/L    Glucose 178 (H) 74 - 106 mg/dl    BUN 31 (H) 7 - 25 mg/dl    Creatinine 1.3 0.6 - 1.3 mg/dl    GFR est AA 53.0      GFR est non-AA 44      Calcium 9.5 8.5 - 10.1 mg/dl    AST (SGOT) 5 (L) 15 - 37 U/L    ALT (SGPT) 20 12 - 78 U/L    Alk. phosphatase 58 45 - 117 U/L    Bilirubin, total 0.5 0.2 - 1.0 mg/dl    Protein, total 8.3 (H) 6.4 - 8.2 gm/dl    Albumin 3.7 3.4 - 5.0 gm/dl    Anion gap 10 5 - 15 mmol/L   NT-PRO BNP    Collection Time: 02/18/18  5:15 PM   Result Value Ref Range    NT pro-BNP 7.0 0.0 - 125.0 pg/ml   TROPONIN I    Collection Time: 02/18/18  5:15 PM   Result Value Ref Range    Troponin-I <0.015 0.000 - 0.045 ng/ml   D DIMER    Collection Time: 02/18/18  5:15 PM   Result Value Ref Range     D DIMER 0.90 (H) 0.01 - 0.50 ug/mL (FEU)   POC BLOOD GAS + LACTIC ACID    Collection Time: 02/18/18  6:03 PM   Result Value Ref Range    pH 7.393 7.350 - 7.450      PCO2 36.8 35.0 - 45.0 mm Hg    PO2 174.0 (H) 75 - 100 mm Hg    BICARBONATE 22.5 18.0 - 26.0 mmol/L    O2 SAT 100.0 90 - 100 %    CO2, TOTAL 24.0 24 - 29 mmol/L    Lactic Acid 5.40 (HH) 0.40 - 2.00 mmol/L    BASE EXCESS -2 -2 - 3 mmol/L    Patient temp. 98.0      Sample type Art      FIO2 40      SITE R Radial      DEVICE BiPAP      ALLENS TEST Pass      IPAP/PIP 18      EPAP/CPAP/PEEP 8      Respiratory Rate 12      SET RATE 10      VTEXP 850     LACTIC ACID    Collection Time: 02/18/18  6:45 PM   Result Value Ref Range    Lactic Acid 3.9 (H) 0.4 - 2.0 mmol/L   GLUCOSE, POC    Collection Time: 02/18/18 10:55 PM   Result Value Ref Range    Glucose (POC) 182 (H) 65 - 105 mg/dL   TSH 3RD GENERATION    Collection Time: 02/19/18  4:06 AM   Result Value Ref Range    TSH 0.149 (L) 0.358 - 3.740 uIU/mL   CBC W/O DIFF    Collection Time: 02/19/18  4:06 AM   Result Value Ref Range    WBC 17.0 (H) 4.0 - 11.0 1000/mm3  RBC 4.21 3.60 - 5.20 M/uL    HGB 12.3 (L) 13.0 - 17.2 gm/dl    HCT 37.6 37.0 - 50.0 %    MCV 89.3 80.0 - 98.0 fL    MCH 29.2 25.4 - 34.6 pg    MCHC 32.7 30.0 - 36.0 gm/dl    PLATELET 249 140 - 450 1000/mm3    MPV 11.1 (H) 6.0 - 10.0 fL    RDW-SD 45.1 36.4 - 46.3     LIPID PANEL    Collection Time: 02/19/18  4:06 AM   Result Value Ref Range    Cholesterol, total 123 (L) 140 - 199 mg/dl    HDL Cholesterol 58 40 - 96 mg/dl    Triglyceride 66 29 - 150 mg/dl    LDL, calculated 52 0 - 130 mg/dl    CHOL/HDL Ratio 2.1 0.0 - 4.4 Ratio   METABOLIC PANEL, COMPREHENSIVE    Collection Time: 02/19/18  4:06 AM   Result Value Ref Range    Sodium 134 (L) 136 - 145 mEq/L    Potassium 4.7 3.5 - 5.1 mEq/L    Chloride 102 98 - 107 mEq/L    CO2 22 21 - 32 mEq/L    Glucose 330 (H) 74 - 106 mg/dl    BUN 37 (H) 7 - 25 mg/dl    Creatinine 1.2 0.6 - 1.3 mg/dl     GFR est AA 58.0      GFR est non-AA 48      Calcium 8.3 (L) 8.5 - 10.1 mg/dl    AST (SGOT) 3 (L) 15 - 37 U/L    ALT (SGPT) 15 12 - 78 U/L    Alk. phosphatase 49 45 - 117 U/L    Bilirubin, total 0.4 0.2 - 1.0 mg/dl    Protein, total 6.8 6.4 - 8.2 gm/dl    Albumin 3.1 (L) 3.4 - 5.0 gm/dl    Anion gap 10 5 - 15 mmol/L   HEMOGLOBIN A1C W/O EAG    Collection Time: 02/19/18  4:06 AM   Result Value Ref Range    Hemoglobin A1c 7.4 (H) 4.2 - 6.3 %   GLUCOSE, POC    Collection Time: 02/19/18  9:04 AM   Result Value Ref Range    Glucose (POC) 233 (H) 65 - 105 mg/dL   GLUCOSE, POC    Collection Time: 02/19/18 12:41 PM   Result Value Ref Range    Glucose (POC) 355 (H) 65 - 105 mg/dL       All Micro Results     None          Imaging:    Xr Chest Sngl V    Result Date: 02/18/2018  INDICATION: Respiratory failure   EXAMINATION: XR CHEST SNGL V COMPARISON: 02/04/2018 FINDINGS: The study shows a normal sized heart.. Stable 17 mm nodule at the left lower lobe.. Bullous emphysema.     IMPRESSION: 1. Stable 17 mm nodule at the left lower lobe. 2. Bullous emphysema.     Xr Chest Pa Lat    Result Date: 02/04/2018  INDICATION: shortness of breath   EXAMINATION: XR CHEST PA LAT COMPARISON: 01/15/2018 FINDINGS: The study shows a normal sized heart.. Stable 19 mm nodule at the left lower lobe.. Left lingular scarring.     IMPRESSION: Left basilar scarring and stable 19 mm nodule at the left lower lobe.     Cta Chest W Or W Wo Cont    Result Date: 02/18/2018  EXAM: CTA  CHEST W OR W WO CONT INDICATION: Respiratory failure, with elevated d-dimer..    TECHNIQUE: CTA scan of the chest with IV contrast including with 3D MIP Images including coronal and sagittal images. DICOM format image data is available to non-affiliated external healthcare facilities or entities on a secure, media free, reciprocally searchable basis with patient authorization for 12 months following the date of the study. COMPARISON: No relevant prior studies available. FINDINGS:  Pulmonary arteries: Unremarkable. No pulmonary embolism. Aorta: No acute findings. No thoracic aortic aneurysm or dissection. Lungs: Stable 19 mm left lower lobe lung nodule. Bullous emphysema. Lymph nodes: Unremarkable. No enlarged lymph nodes. Esophagus: Normal Pleural spaces: Unremarkable. No significant effusion. No pneumothorax. Heart: Unremarkable. No cardiomegaly. No significant pericardial effusion. No evidence of right ventricular dysfunction. Thyroid: Normal Chest wall: No abnormal findings. Bones: No fractures identified.     IMPRESSION: 1. No evidence of pulmonary embolism. 2. Stable 19 mm left lower lobe lung nodule. 3. Bullous emphysema.     Ct Abd Pelv W Cont    Result Date: 02/18/2018  EXAM: CT ABD PELV W CONT INDICATION: Worsening abdominal pain, with history of incarcerated ventral hernia.    TECHNIQUE: Axial CT scan of the abdomen and pelvis with   IV contrast    including coronal and sagittal reconstructions.  DICOM format image data is available to non-affiliated external healthcare facilities or entities on a secure, media free, reciprocally searchable basis with patient authorization for 12 months following the date of the study. COMPARISON: 01/29/2018. FINDINGS: ABDOMEN: Liver: Unremarkable. No mass. Gallbladder and bile ducts: Unremarkable. No calcified stones. No ductal dilatation. Pancreas: Unremarkable. No ductal dilatation. No mass. Spleen: Unchanged splenic lesions. Adrenals: Unremarkable. No mass. Kidneys and ureters: Unremarkable. No hydronephrosis. No solid mass. Appendix: No findings to suggest appendicitis. Stomach and bowel: Stomach unremarkable. No obstruction. No mucosal thickening. No inflammatory changes. Multiple sigmoid diverticula without diverticulitis. Peritoneum: Unremarkable. No significant fluid collection. No free air. Lymph nodes: Unremarkable. No enlarged lymph nodes. Vasculature: Unremarkable. No aortic aneurysm. Bones: No acute  fracture. Abdominal wall: Unchanged fat and fluid containing ventral hernia. PELVIS: Bladder: Unremarkable. No mass. Reproductive: Unremarkable as visualized.     IMPRESSION: 1. Multiple sigmoid diverticula without diverticulitis. 2. Unchanged fat and fluid containing ventral hernia. 3. Unchanged splenic lesions.     Ct Abd Pelv W Cont    Result Date: 01/29/2018  DICOM format image data is available to non-affiliated external healthcare facilities or entities on a secure, media free, reciprocally searchable basis with patient authorization for 12 months following the date of the study. Clinical history: Ventral hernia, abdominal pain EXAMINATION: CT scan of the abdomen and pelvis 01/29/2018. 5 mm spiral scanning is performed from the costophrenic angles to the symphysis pubis. Coronal and sagittal reconstruction imaging has been obtained. FINDINGS: There are bilateral bullae. Osteopenia and degenerative changes of the spine. Right hip prosthesis. There are splenic hypodensities in size from study of 08/27/2016. Spleen, adrenal glands and bladder are unremarkable. Uterus is absent. Subcentimeter renal hypodensities too small to characterize. 7 mm nonobstructing right nephrolithiasis. There are colonic diverticuli. Colon, terminal ileum, appendix, stomach and small bowel loops are within normal limits. Abdominal aorta is within normal limits. Small to borderline lymph nodes at the porta hepatis. Fat and fluid containing anterior abdominal wall hernia with surrounding inflammation suggesting incarceration.     IMPRESSION: 1. Incarcerated fat containing anterior abdominal wall hernia. 2. 7 mm nonobstructing right nephrolithiasis. 3. Diverticulosis. 4. Hysterectomy. 5. Small splenic lesions, increased in size  from 08/27/2016.           Echo Results  (Last 48 hours)    None          Current Facility-Administered Medications   Medication Dose Route Frequency   ??? dextrose (D50W) injection syrg 5-25 g  10-50 mL IntraVENous PRN    ??? glucagon (GLUCAGEN) injection 1 mg  1 mg IntraMUSCular PRN   ??? insulin glargine (LANTUS) injection 1-100 Units  1-100 Units SubCUTAneous QHS   ??? insulin lispro (HUMALOG) injection   SubCUTAneous AC&HS   ??? insulin lispro (HUMALOG) injection 1-100 Units  1-100 Units SubCUTAneous PRN   ??? albuterol-ipratropium (DUO-NEB) 2.5 MG-0.5 MG/3 ML  3 mL Nebulization Q4H RT   ??? cloNIDine HCl (CATAPRES) tablet 0.3 mg  0.3 mg Oral BID   ??? dilTIAZem CD (CARDIZEM CD) capsule 180 mg  180 mg Oral DAILY   ??? gabapentin (NEURONTIN) capsule 100 mg  100 mg Oral TID   ??? losartan (COZAAR) tablet 100 mg  100 mg Oral DAILY   ??? magnesium oxide (MAG-OX) tablet 400 mg  400 mg Oral DAILY   ??? montelukast (SINGULAIR) tablet 10 mg  10 mg Oral QHS   ??? QUEtiapine SR (SEROquel XR) tablet 150 mg  150 mg Oral QHS   ??? rosuvastatin (CRESTOR) tablet 5 mg  5 mg Oral QHS   ??? spironolactone (ALDACTONE) tablet 100 mg  100 mg Oral DAILY   ??? tiotropium bromide (SPIRIVA RESPIMAT) 2.5 mcg /actuation  2 Puff Inhalation DAILY   ??? traZODone (DESYREL) tablet 50 mg  50 mg Oral QHS PRN   ??? fluticasone propionate (FLONASE) 50 mcg/actuation nasal spray 2 Spray  2 Spray Both Nostrils DAILY   ??? sodium chloride (NS) flush 5-10 mL  5-10 mL IntraVENous Q8H   ??? sodium chloride (NS) flush 5-10 mL  5-10 mL IntraVENous PRN   ??? naloxone (NARCAN) injection 0.1 mg  0.1 mg IntraVENous PRN   ??? acetaminophen (TYLENOL) tablet 650 mg  650 mg Oral Q6H PRN   ??? oxyCODONE-acetaminophen (PERCOCET) 5-325 mg per tablet 1 Tab  1 Tab Oral Q6H PRN   ??? heparin (porcine) injection 5,000 Units  5,000 Units SubCUTAneous BID   ??? methylPREDNISolone (PF) (SOLU-MEDROL) injection 40 mg  40 mg IntraVENous Q8H   ??? fluticasone-vilanterol (BREO ELLIPTA) 172mg-25mcg/puff  1 Puff Inhalation DAILY            Dragon medical dictation software was used for portions of this report. Unintended errors may occur.   KClearnce Sorrel MD  02/19/2018

## 2018-02-19 NOTE — Other (Signed)
Bedside and Verbal shift change report given to Henreitta Leber, RN   (oncoming nurse) by Farrel Conners RN (offgoing nurse). Report included the following information SBAR.

## 2018-02-19 NOTE — Progress Notes (Signed)
Pt refused her duoneb this am. States she was fine and didn't want. States she wants her breakfast    Sats 98% on NC

## 2018-02-19 NOTE — Progress Notes (Signed)
Code R Consult Received.    After Hours Consult:    No    Last admission dc date and dx       02/08/18     Copd     What was the discharge plan    Home     Now presents       SOB    Case Discussed with ED Physician:   N    Case Discussed with Attending Physician:    N    Appropriate for Admission:      Yes inpatient       Anticipated Discharge Needs:    Home    Disposition if discharged from the ED          Readmission interview done (epic)     N    Readmission assessment completed (compare AD)   Mikael Spray complete Y

## 2018-02-19 NOTE — Progress Notes (Signed)
Patient refused bipap at this time she is on 2 lpm nc sat 100., hr 71, she is alert and awake, RN is aware

## 2018-02-19 NOTE — Progress Notes (Signed)
Pt refused her duoneb this am. States she was fine and didn't want. States she wants her breakfast    Sats 98% on NC

## 2018-02-19 NOTE — Progress Notes (Signed)
Medicine Progress Note    Date of Admission: 02/18/2018  Length of Stay: 1    Assessment And Plan     COPD with acute exacerbation  Lactic acidosis  Anterior abdominal wall incarcerated fat hernia  Insulin-dependent diabetes mellitus type 2 uncontrolled with hyperglycemia  Hypertension  Hyperlipidemia  Peripheral neuropathy  GERD  Chronic pain syndrome  History hepatitis C    Treatment plan  Patient admitted to hospital with severe COPD except patient was placed on BiPAP.  Currently off BiPAP.  Still has shortness of breath.  Continue steroids, antibiotics and nebs.  Patient also has abdominal wall hernia.  She was supposed to see Dr. Eulas Post tomorrow.  Discussed with Dr. Eulas Post and he is very kind  to see patient in the hospital.  Patient will be started on insulin Lantus along with sliding scale for diabetes.  Decrease clonidine to 0.2 mg.  Blood pressure is on lower side.  Continue home medications.  Patient be transferred to telemetry floor.  Plan discussed with patient and husband.    Unit floortime 35 minutes, more than half time spent in counseling and coordination of care      Discussed with patient. Updates regarding diagnose, tests results, prognosis, treatment  of the patient's medical conditions discussed in detail     Subjective:     Shortness of breath slightly better.  Some chronic abdominal pain.  Feeling weak.  No chest pain.     Objective:     Visit Vitals  BP (!) 89/62   Pulse 100   Temp 98.2 ??F (36.8 ??C)   Resp 19   Ht 5' 8"  (1.727 m)   Wt 83.6 kg (184 lb 4.9 oz)   SpO2 100%   Breastfeeding? No   BMI 28.02 kg/m??         Intake/Output Summary (Last 24 hours) at 02/19/2018 1250  Last data filed at 02/19/2018 0958  Gross per 24 hour   Intake 1480 ml   Output 500 ml   Net 980 ml     Constitutional:??Awake and alert, NAD  HENT:??atraumatic, normocephalic, oropharynx clear ??  Eyes:??????conjunctiva normal  Neck:??????supple and trachea normal  Cardiovascular:?? Regular rate and rhythm, heart sounds normal, intact  distal pulses  Pulmonary/Chest Wall: Bilateral wheezing  Abdominal:????????????appearance normal, soft, non-tender upon palpation,bowel sounds normal.  Neurological:??????awake, alert and oriented, CN intact, no focal neuro deficits, moves all extremities equally .  Extremities:???? No clubbing or cyanosis. Pedal pulses 2+ b/l.  Capillary refill normal.Skin intact      Recent Results (from the past 24 hour(s))   CBC WITH AUTOMATED DIFF    Collection Time: 02/18/18  5:15 PM   Result Value Ref Range    WBC 18.9 (H) 4.0 - 11.0 1000/mm3    RBC 5.05 3.60 - 5.20 M/uL    HGB 14.7 13.0 - 17.2 gm/dl    HCT 44.7 37.0 - 50.0 %    MCV 88.5 80.0 - 98.0 fL    MCH 29.1 25.4 - 34.6 pg    MCHC 32.9 30.0 - 36.0 gm/dl    PLATELET 336 140 - 450 1000/mm3    MPV 11.1 (H) 6.0 - 10.0 fL    RDW-SD 45.4 36.4 - 46.3      NRBC 0 0 - 0      IMMATURE GRANULOCYTES 0.5 0.0 - 3.0 %    NEUTROPHILS 91.3 (H) 34 - 64 %    LYMPHOCYTES 5.8 (L) 28 - 48 %    MONOCYTES 2.2 1 -  13 %    EOSINOPHILS 0.1 0 - 5 %    BASOPHILS 0.1 0 - 3 %   METABOLIC PANEL, COMPREHENSIVE    Collection Time: 02/18/18  5:15 PM   Result Value Ref Range    Sodium 136 136 - 145 mEq/L    Potassium 4.7 3.5 - 5.1 mEq/L    Chloride 102 98 - 107 mEq/L    CO2 24 21 - 32 mEq/L    Glucose 178 (H) 74 - 106 mg/dl    BUN 31 (H) 7 - 25 mg/dl    Creatinine 1.3 0.6 - 1.3 mg/dl    GFR est AA 53.0      GFR est non-AA 44      Calcium 9.5 8.5 - 10.1 mg/dl    AST (SGOT) 5 (L) 15 - 37 U/L    ALT (SGPT) 20 12 - 78 U/L    Alk. phosphatase 58 45 - 117 U/L    Bilirubin, total 0.5 0.2 - 1.0 mg/dl    Protein, total 8.3 (H) 6.4 - 8.2 gm/dl    Albumin 3.7 3.4 - 5.0 gm/dl    Anion gap 10 5 - 15 mmol/L   NT-PRO BNP    Collection Time: 02/18/18  5:15 PM   Result Value Ref Range    NT pro-BNP 7.0 0.0 - 125.0 pg/ml   TROPONIN I    Collection Time: 02/18/18  5:15 PM   Result Value Ref Range    Troponin-I <0.015 0.000 - 0.045 ng/ml   D DIMER    Collection Time: 02/18/18  5:15 PM   Result Value Ref Range    D DIMER 0.90 (H) 0.01 -  0.50 ug/mL (FEU)   POC BLOOD GAS + LACTIC ACID    Collection Time: 02/18/18  6:03 PM   Result Value Ref Range    pH 7.393 7.350 - 7.450      PCO2 36.8 35.0 - 45.0 mm Hg    PO2 174.0 (H) 75 - 100 mm Hg    BICARBONATE 22.5 18.0 - 26.0 mmol/L    O2 SAT 100.0 90 - 100 %    CO2, TOTAL 24.0 24 - 29 mmol/L    Lactic Acid 5.40 (HH) 0.40 - 2.00 mmol/L    BASE EXCESS -2 -2 - 3 mmol/L    Patient temp. 98.0      Sample type Art      FIO2 40      SITE R Radial      DEVICE BiPAP      ALLENS TEST Pass      IPAP/PIP 18      EPAP/CPAP/PEEP 8      Respiratory Rate 12      SET RATE 10      VTEXP 850     LACTIC ACID    Collection Time: 02/18/18  6:45 PM   Result Value Ref Range    Lactic Acid 3.9 (H) 0.4 - 2.0 mmol/L   GLUCOSE, POC    Collection Time: 02/18/18 10:55 PM   Result Value Ref Range    Glucose (POC) 182 (H) 65 - 105 mg/dL   TSH 3RD GENERATION    Collection Time: 02/19/18  4:06 AM   Result Value Ref Range    TSH 0.149 (L) 0.358 - 3.740 uIU/mL   CBC W/O DIFF    Collection Time: 02/19/18  4:06 AM   Result Value Ref Range    WBC 17.0 (H) 4.0 - 11.0 1000/mm3  RBC 4.21 3.60 - 5.20 M/uL    HGB 12.3 (L) 13.0 - 17.2 gm/dl    HCT 37.6 37.0 - 50.0 %    MCV 89.3 80.0 - 98.0 fL    MCH 29.2 25.4 - 34.6 pg    MCHC 32.7 30.0 - 36.0 gm/dl    PLATELET 249 140 - 450 1000/mm3    MPV 11.1 (H) 6.0 - 10.0 fL    RDW-SD 45.1 36.4 - 46.3     LIPID PANEL    Collection Time: 02/19/18  4:06 AM   Result Value Ref Range    Cholesterol, total 123 (L) 140 - 199 mg/dl    HDL Cholesterol 58 40 - 96 mg/dl    Triglyceride 66 29 - 150 mg/dl    LDL, calculated 52 0 - 130 mg/dl    CHOL/HDL Ratio 2.1 0.0 - 4.4 Ratio   METABOLIC PANEL, COMPREHENSIVE    Collection Time: 02/19/18  4:06 AM   Result Value Ref Range    Sodium 134 (L) 136 - 145 mEq/L    Potassium 4.7 3.5 - 5.1 mEq/L    Chloride 102 98 - 107 mEq/L    CO2 22 21 - 32 mEq/L    Glucose 330 (H) 74 - 106 mg/dl    BUN 37 (H) 7 - 25 mg/dl    Creatinine 1.2 0.6 - 1.3 mg/dl    GFR est AA 58.0      GFR est non-AA  48      Calcium 8.3 (L) 8.5 - 10.1 mg/dl    AST (SGOT) 3 (L) 15 - 37 U/L    ALT (SGPT) 15 12 - 78 U/L    Alk. phosphatase 49 45 - 117 U/L    Bilirubin, total 0.4 0.2 - 1.0 mg/dl    Protein, total 6.8 6.4 - 8.2 gm/dl    Albumin 3.1 (L) 3.4 - 5.0 gm/dl    Anion gap 10 5 - 15 mmol/L   HEMOGLOBIN A1C W/O EAG    Collection Time: 02/19/18  4:06 AM   Result Value Ref Range    Hemoglobin A1c 7.4 (H) 4.2 - 6.3 %   GLUCOSE, POC    Collection Time: 02/19/18  9:04 AM   Result Value Ref Range    Glucose (POC) 233 (H) 65 - 105 mg/dL   GLUCOSE, POC    Collection Time: 02/19/18 12:41 PM   Result Value Ref Range    Glucose (POC) 355 (H) 65 - 105 mg/dL       All Micro Results     None          Imaging:    Xr Chest Sngl V    Result Date: 02/18/2018  INDICATION: Respiratory failure   EXAMINATION: XR CHEST SNGL V COMPARISON: 02/04/2018 FINDINGS: The study shows a normal sized heart.. Stable 17 mm nodule at the left lower lobe.. Bullous emphysema.     IMPRESSION: 1. Stable 17 mm nodule at the left lower lobe. 2. Bullous emphysema.     Xr Chest Pa Lat    Result Date: 02/04/2018  INDICATION: shortness of breath   EXAMINATION: XR CHEST PA LAT COMPARISON: 01/15/2018 FINDINGS: The study shows a normal sized heart.. Stable 19 mm nodule at the left lower lobe.. Left lingular scarring.     IMPRESSION: Left basilar scarring and stable 19 mm nodule at the left lower lobe.     Cta Chest W Or W Wo Cont    Result Date: 02/18/2018  EXAM: CTA  CHEST W OR W WO CONT INDICATION: Respiratory failure, with elevated d-dimer..    TECHNIQUE: CTA scan of the chest with IV contrast including with 3D MIP Images including coronal and sagittal images. DICOM format image data is available to non-affiliated external healthcare facilities or entities on a secure, media free, reciprocally searchable basis with patient authorization for 12 months following the date of the study. COMPARISON: No relevant prior studies available. FINDINGS: Pulmonary arteries: Unremarkable. No  pulmonary embolism. Aorta: No acute findings. No thoracic aortic aneurysm or dissection. Lungs: Stable 19 mm left lower lobe lung nodule. Bullous emphysema. Lymph nodes: Unremarkable. No enlarged lymph nodes. Esophagus: Normal Pleural spaces: Unremarkable. No significant effusion. No pneumothorax. Heart: Unremarkable. No cardiomegaly. No significant pericardial effusion. No evidence of right ventricular dysfunction. Thyroid: Normal Chest wall: No abnormal findings. Bones: No fractures identified.     IMPRESSION: 1. No evidence of pulmonary embolism. 2. Stable 19 mm left lower lobe lung nodule. 3. Bullous emphysema.     Ct Abd Pelv W Cont    Result Date: 02/18/2018  EXAM: CT ABD PELV W CONT INDICATION: Worsening abdominal pain, with history of incarcerated ventral hernia.    TECHNIQUE: Axial CT scan of the abdomen and pelvis with   IV contrast    including coronal and sagittal reconstructions.  DICOM format image data is available to non-affiliated external healthcare facilities or entities on a secure, media free, reciprocally searchable basis with patient authorization for 12 months following the date of the study. COMPARISON: 01/29/2018. FINDINGS: ABDOMEN: Liver: Unremarkable. No mass. Gallbladder and bile ducts: Unremarkable. No calcified stones. No ductal dilatation. Pancreas: Unremarkable. No ductal dilatation. No mass. Spleen: Unchanged splenic lesions. Adrenals: Unremarkable. No mass. Kidneys and ureters: Unremarkable. No hydronephrosis. No solid mass. Appendix: No findings to suggest appendicitis. Stomach and bowel: Stomach unremarkable. No obstruction. No mucosal thickening. No inflammatory changes. Multiple sigmoid diverticula without diverticulitis. Peritoneum: Unremarkable. No significant fluid collection. No free air. Lymph nodes: Unremarkable. No enlarged lymph nodes. Vasculature: Unremarkable. No aortic aneurysm. Bones: No acute fracture. Abdominal wall: Unchanged fat and fluid containing ventral hernia.  PELVIS: Bladder: Unremarkable. No mass. Reproductive: Unremarkable as visualized.     IMPRESSION: 1. Multiple sigmoid diverticula without diverticulitis. 2. Unchanged fat and fluid containing ventral hernia. 3. Unchanged splenic lesions.     Ct Abd Pelv W Cont    Result Date: 01/29/2018  DICOM format image data is available to non-affiliated external healthcare facilities or entities on a secure, media free, reciprocally searchable basis with patient authorization for 12 months following the date of the study. Clinical history: Ventral hernia, abdominal pain EXAMINATION: CT scan of the abdomen and pelvis 01/29/2018. 5 mm spiral scanning is performed from the costophrenic angles to the symphysis pubis. Coronal and sagittal reconstruction imaging has been obtained. FINDINGS: There are bilateral bullae. Osteopenia and degenerative changes of the spine. Right hip prosthesis. There are splenic hypodensities in size from study of 08/27/2016. Spleen, adrenal glands and bladder are unremarkable. Uterus is absent. Subcentimeter renal hypodensities too small to characterize. 7 mm nonobstructing right nephrolithiasis. There are colonic diverticuli. Colon, terminal ileum, appendix, stomach and small bowel loops are within normal limits. Abdominal aorta is within normal limits. Small to borderline lymph nodes at the porta hepatis. Fat and fluid containing anterior abdominal wall hernia with surrounding inflammation suggesting incarceration.     IMPRESSION: 1. Incarcerated fat containing anterior abdominal wall hernia. 2. 7 mm nonobstructing right nephrolithiasis. 3. Diverticulosis. 4. Hysterectomy. 5. Small splenic lesions, increased in size  from 08/27/2016.           Echo Results  (Last 48 hours)    None          Current Facility-Administered Medications   Medication Dose Route Frequency   ??? dextrose (D50W) injection syrg 5-25 g  10-50 mL IntraVENous PRN   ??? glucagon (GLUCAGEN) injection 1 mg  1 mg IntraMUSCular PRN   ??? insulin  glargine (LANTUS) injection 1-100 Units  1-100 Units SubCUTAneous QHS   ??? insulin lispro (HUMALOG) injection   SubCUTAneous AC&HS   ??? insulin lispro (HUMALOG) injection 1-100 Units  1-100 Units SubCUTAneous PRN   ??? albuterol-ipratropium (DUO-NEB) 2.5 MG-0.5 MG/3 ML  3 mL Nebulization Q4H RT   ??? cloNIDine HCl (CATAPRES) tablet 0.3 mg  0.3 mg Oral BID   ??? dilTIAZem CD (CARDIZEM CD) capsule 180 mg  180 mg Oral DAILY   ??? gabapentin (NEURONTIN) capsule 100 mg  100 mg Oral TID   ??? losartan (COZAAR) tablet 100 mg  100 mg Oral DAILY   ??? magnesium oxide (MAG-OX) tablet 400 mg  400 mg Oral DAILY   ??? montelukast (SINGULAIR) tablet 10 mg  10 mg Oral QHS   ??? QUEtiapine SR (SEROquel XR) tablet 150 mg  150 mg Oral QHS   ??? rosuvastatin (CRESTOR) tablet 5 mg  5 mg Oral QHS   ??? spironolactone (ALDACTONE) tablet 100 mg  100 mg Oral DAILY   ??? tiotropium bromide (SPIRIVA RESPIMAT) 2.5 mcg /actuation  2 Puff Inhalation DAILY   ??? traZODone (DESYREL) tablet 50 mg  50 mg Oral QHS PRN   ??? fluticasone propionate (FLONASE) 50 mcg/actuation nasal spray 2 Spray  2 Spray Both Nostrils DAILY   ??? sodium chloride (NS) flush 5-10 mL  5-10 mL IntraVENous Q8H   ??? sodium chloride (NS) flush 5-10 mL  5-10 mL IntraVENous PRN   ??? naloxone (NARCAN) injection 0.1 mg  0.1 mg IntraVENous PRN   ??? acetaminophen (TYLENOL) tablet 650 mg  650 mg Oral Q6H PRN   ??? oxyCODONE-acetaminophen (PERCOCET) 5-325 mg per tablet 1 Tab  1 Tab Oral Q6H PRN   ??? heparin (porcine) injection 5,000 Units  5,000 Units SubCUTAneous BID   ??? methylPREDNISolone (PF) (SOLU-MEDROL) injection 40 mg  40 mg IntraVENous Q8H   ??? fluticasone-vilanterol (BREO ELLIPTA) 125mg-25mcg/puff  1 Puff Inhalation DAILY            Dragon medical dictation software was used for portions of this report. Unintended errors may occur.   KClearnce Sorrel MD  02/19/2018

## 2018-02-19 NOTE — Progress Notes (Signed)
 Patient admitted on 02/18/2018 from home with   Chief Complaint   Patient presents with   . Respiratory Distress    The patient is being treated for COPD Exacerbation and Anterior abdominal wall incarcerated fat hernia- followed by Dr. Franchot.    The patient has been admitted to the hospital 6 times in the past 12 months. The referral has been made to the Transitional Nurse Navigator.    Tentative dc plan: Home with family assistance, physician follow up, and home health RN, OT, PT, SLP, [facility] aide, and MSW with [facility]. The patient may need O2 at home and will need to be tested prior to DC. The face to face for home health has been signed by Dr. Curt. The referral for home health has been emailed to [facility] and the agency liaison, Rolan. If SNF is needed at DC the choice facility is [facility] and [address]. The patient will be loaded in e-dc and sent to area SNFs for backup disposition.     Anticipated Discharge Date: 3-4 days pending progress    PCP: Lucious Gun, PA-C     Specialists: Dr. Franchot- Surgery and Dr. Manuelita    Dialysis Unit: NA    Pharmacy: [facility] and [facility]    DME: cane, walker, shower chair, BSC, stair lift, and Nebulizer    Home Environment: Lives at [address]  [place] [address]. Lives with her husband in a 2 story townhome with 1 stoop entry step and upstairs bedroom. Her husband assists with ADLs as needed. They both state she is not able to leave the house too often due to SOB or weakness or a combination.     Prior to admission open services: None at this time, previously open to [facility] but was Dc'd on 7/30. Patient and husband state that they have been to the hospital since then in August and home health was not resumed at that time.     Home Health Agency-  Personal Care Agency-    Extended Emergency Contact Information  Primary Emergency Contact: Goodwin,Bobby  Address: [address]            [place], [place] [address] [place]  Home Phone: [phone]  Mobile Phone: [phone]  Relation: Spouse     Transportation: family will transport home    Therapy Recommendations:    OT = pending    PT = pending    SLP =      RT Home O2 Evaluation =  Will need evaluation 24-48 hours prior to discharge    Wound Care =  NA    [facility] Consulted: No    Case Management Assessment        Preferred Language for Healthcare Related Communication         Spiritual/Ethnic/Cultural/Religious Needs that Should be Incorporated Into Your Care          FUNCTIONAL ASSESSMENT   Fall in Past 12 Months: No               Decline in Gait/Transfer/Balance: No       Decline in Capacity to Feed/Dress/Bathe: No   Developmental Delay: No   Chewing/Swallowing Problems: No      DYSPHAGIA SCREENING                          Difficulty with Secretions  Difficulty with Secretions: No      Speech Slurred/Thick/Garbled     Speech Slurred/Thick/Garbled: No      ABUSE/NEGLECT SCREENING   Physical Abuse/Neglect: Denies   Sexual Abuse: Denies   Sexual Abuse: Denies   Other Abuse/Issues: Denies          PRIMARY DECISION MAKER                                        ADVANCE CARE PLANNING (ACP) DOCUMENTS   Confirm Advance Directive: Yes, not on file              Suicide/Psychosocial Screening   Primary Diagnosis or Primary Complaint of an Emotional Behavior Disorder: No   Patient is Currently Experiencing Depression: No   Suicidal Ideation/Attempts: No   Homicidal Ideation/Attempts: No   Alcohol/Drug Intoxication: No   Hallucinations/Delusions: No   Pending, Active, or Temporary Detention Orders: No   Aggressive/Inappropriate Behavior: No      SAD PERSONS                                                  READMIT RISK TOOL   Support Systems: Friends \ neighbors   Relationship with Primary Physician Group: Seen at least one time within the past 6 months   History of Falls Within Past 3 Months: No   Needs  Assistance with Wound Care AND/OR Mgnt of O2, Nebulizer: No   Requires Financial, Physical and/or Educational Assistance With Medications: No   History of Mental Illness: Yes   Living Alone: No      CARE MANAGEMENT INTERVENTIONS   Readmission Interview Completed: Yes   PCP Verified by CM: Yes           Mode of Transport at Discharge: Self       Transition of Care Consult (CM Consult): Discharge Planning, Home Health, SNF                               Current Support Network: Lives with Spouse   Reason for Referral: DCP Rounds   History Provided By: Patient, Medical Record, Spouse   Patient Orientation: Alert and Oriented, Person, Place, Situation, Self   Cognition: Alert   Support System Response: Concerned, Cooperative   Previous Living Arrangement: Lives with Family Dependent   Home Accessibility: Steps, Multi Level Home(1 stoop step to enter the home)   Prior Functional Level: Assistance with the following:, Bathing, Dressing, Housework, Cooking, Mobility, Shopping   Current Functional Level: Assistance with the following:, Bathing, Dressing, Housework, Cooking, Mobility, Toileting, Shopping           Ability to make needs known:: Fair   Family able to assist with home care needs:: Yes               Types of Needs Identified: Disease Management Education, Treatment Education, ADLs/IADLs, Functions Dependency Needs, Nutrition/Diet, Emotional Support, Support System/Social, Activity/Exercise   Anticipated Discharge Needs: Home Health Services, Skilled Nursing Facility, TCC           Plan discussed with Pt/Family/Caregiver: Yes   Freedom of Choice Offered: Yes      DISCHARGE LOCATION   Discharge Placement: Home with home health
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
## 2018-02-19 NOTE — Progress Notes (Signed)
7829- Patient has known history of diabetes with A1C of 7.4. No glucommander orders in Epic. Glucose with morning labs 330. POC glucose @ 0905 233. Dr. Josephine Igo paged.    1018- BP 86/60 (66) after morning medications. Patient is asymptomatic. Per husband, primary physician recently instructed patient to take 50mg  Losartan instead of 100mg  at office due to low BP. Dr. Josephine Igo paged.

## 2018-02-20 LAB — POCT GLUCOSE
POC Glucose: 242 mg/dL — ABNORMAL HIGH (ref 65–105)
POC Glucose: 203 mg/dL — ABNORMAL HIGH (ref 65–105)

## 2018-02-20 LAB — BASIC METABOLIC PANEL
Anion Gap: 6 mmol/L (ref 5–15)
BUN: 38 mg/dl — ABNORMAL HIGH (ref 7–25)
CO2: 25 mEq/L (ref 21–32)
Calcium: 8.5 mg/dl (ref 8.5–10.1)
Chloride: 104 mEq/L (ref 98–107)
Creatinine: 1 mg/dl (ref 0.6–1.3)
EGFR IF NonAfrican American: 59
GFR African American: >60
Glucose: 243 mg/dl — ABNORMAL HIGH (ref 74–106)
Potassium: 5.1 mEq/L (ref 3.5–5.1)
Sodium: 135 mEq/L — ABNORMAL LOW (ref 136–145)

## 2018-02-20 LAB — CBC WITH AUTO DIFFERENTIAL
Basophils %: 0.1 % (ref 0–3)
Eosinophils %: 0 % (ref 0–5)
Hematocrit: 35.5 % — ABNORMAL LOW (ref 37.0–50.0)
Hemoglobin: 11.5 gm/dl — ABNORMAL LOW (ref 13.0–17.2)
Immature Granulocytes: 0.6 % (ref 0.0–3.0)
Lymphocytes %: 3.2 % — ABNORMAL LOW (ref 28–48)
MCH: 28.7 pg (ref 25.4–34.6)
MCHC: 32.4 gm/dl (ref 30.0–36.0)
MCV: 88.5 fL (ref 80.0–98.0)
MPV: 11.3 fL — ABNORMAL HIGH (ref 6.0–10.0)
Monocytes %: 4.3 % (ref 1–13)
Neutrophils %: 91.8 % — ABNORMAL HIGH (ref 34–64)
Nucleated RBCs: 0 (ref 0–0)
Platelets: 241 10*3/uL (ref 140–450)
RBC: 4.01 M/uL (ref 3.60–5.20)
RDW-SD: 45.1 (ref 36.4–46.3)
WBC: 19.4 10*3/uL — ABNORMAL HIGH (ref 4.0–11.0)

## 2018-02-20 LAB — CBC WITH AUTOMATED DIFF
BASOPHILS: 0.1 % (ref 0–3)
EOSINOPHILS: 0 % (ref 0–5)
HCT: 35.5 % — ABNORMAL LOW (ref 37.0–50.0)
HGB: 11.5 gm/dl — ABNORMAL LOW (ref 13.0–17.2)
IMMATURE GRANULOCYTES: 0.6 % (ref 0.0–3.0)
LYMPHOCYTES: 3.2 % — ABNORMAL LOW (ref 28–48)
MCH: 28.7 pg (ref 25.4–34.6)
MCHC: 32.4 gm/dl (ref 30.0–36.0)
MCV: 88.5 fL (ref 80.0–98.0)
MONOCYTES: 4.3 % (ref 1–13)
MPV: 11.3 fL — ABNORMAL HIGH (ref 6.0–10.0)
NEUTROPHILS: 91.8 % — ABNORMAL HIGH (ref 34–64)
NRBC: 0 (ref 0–0)
PLATELET: 241 10*3/uL (ref 140–450)
RBC: 4.01 M/uL (ref 3.60–5.20)
RDW-SD: 45.1 (ref 36.4–46.3)
WBC: 19.4 10*3/uL — ABNORMAL HIGH (ref 4.0–11.0)

## 2018-02-20 LAB — GLUCOSE, POC
Glucose (POC): 242 mg/dL — ABNORMAL HIGH (ref 65–105)
Glucose (POC): 203 mg/dL — ABNORMAL HIGH (ref 65–105)

## 2018-02-20 LAB — METABOLIC PANEL, BASIC
Anion gap: 6 mmol/L (ref 5–15)
BUN: 38 mg/dl — ABNORMAL HIGH (ref 7–25)
CO2: 25 mEq/L (ref 21–32)
Calcium: 8.5 mg/dl (ref 8.5–10.1)
Chloride: 104 mEq/L (ref 98–107)
Creatinine: 1 mg/dl (ref 0.6–1.3)
GFR est AA: >60
GFR est non-AA: 59
Glucose: 243 mg/dl — ABNORMAL HIGH (ref 74–106)
Potassium: 5.1 mEq/L (ref 3.5–5.1)
Sodium: 135 mEq/L — ABNORMAL LOW (ref 136–145)

## 2018-02-20 MED ORDER — METHYLPREDNISOLONE (PF) 40 MG/ML IJ SOLR
40 mg/mL | Freq: Two times a day (BID) | INTRAMUSCULAR | Status: DC
Start: 2018-02-20 — End: 2018-02-21
  Administered 2018-02-20 – 2018-02-21 (×2): via INTRAVENOUS

## 2018-02-20 MED FILL — DILTIAZEM ER 180 MG 24 HR CAP: 180 mg | ORAL | Qty: 1

## 2018-02-20 MED FILL — IPRATROPIUM-ALBUTEROL 2.5 MG-0.5 MG/3 ML NEB SOLUTION: 2.5 mg-0.5 mg/3 ml | RESPIRATORY_TRACT | Qty: 3

## 2018-02-20 MED FILL — LOSARTAN 50 MG TAB: 50 mg | ORAL | Qty: 1

## 2018-02-20 MED FILL — GABAPENTIN 100 MG CAP: 100 mg | ORAL | Qty: 1

## 2018-02-20 MED FILL — HEPARIN (PORCINE) 5,000 UNIT/ML IJ SOLN: 5000 unit/mL | INTRAMUSCULAR | Qty: 1

## 2018-02-20 MED FILL — CLONIDINE 0.2 MG TAB: 0.2 mg | ORAL | Qty: 1

## 2018-02-20 MED FILL — MONTELUKAST 10 MG TAB: 10 mg | ORAL | Qty: 1

## 2018-02-20 MED FILL — BD POSIFLUSH NORMAL SALINE 0.9 % INJECTION SYRINGE: INTRAMUSCULAR | Qty: 10

## 2018-02-20 MED FILL — ROSUVASTATIN 5 MG TAB: 5 mg | ORAL | Qty: 1

## 2018-02-20 MED FILL — OXYCODONE-ACETAMINOPHEN 5 MG-325 MG TAB: 5-325 mg | ORAL | Qty: 1

## 2018-02-20 MED FILL — SOLU-MEDROL (PF) 40 MG/ML SOLUTION FOR INJECTION: 40 mg/mL | INTRAMUSCULAR | Qty: 1

## 2018-02-20 MED FILL — QUETIAPINE SR 50 MG 24 HR TAB: 50 mg | ORAL | Qty: 3

## 2018-02-20 MED FILL — BD POSIFLUSH NORMAL SALINE 0.9 % INJECTION SYRINGE: INTRAMUSCULAR | Qty: 20

## 2018-02-20 MED FILL — SPIRONOLACTONE 25 MG TAB: 25 mg | ORAL | Qty: 4

## 2018-02-20 MED FILL — MAGNESIUM OXIDE 400 MG TAB: 400 mg | ORAL | Qty: 1

## 2018-02-20 MED FILL — ACETAMINOPHEN 325 MG TABLET: 325 mg | ORAL | Qty: 2

## 2018-02-20 NOTE — Progress Notes (Addendum)
Medicine Progress Note    Date of Admission: 02/18/2018  Length of Stay: 2    Assessment And Plan     COPD with acute exacerbation  Lactic acidosis  Anterior abdominal wall .  No incarceration  Diabetes  mellitus type 2 uncontrolled with hyperglycemia  Hypertension  Hyperlipidemia  Peripheral neuropathy  GERD  Chronic pain syndrome  History hepatitis C    Treatment plan  Patient admitted to hospital with severe COPD except patient was placed on BiPAP.  Currently off BiPAP.  Still has shortness of breath.  Will decrease IV steroids to 20 mg twice daily.  Add Mucinex 600 mg p.o. twice daily.  Continue nebs.  Okay to transfer to medical floor.  Patient also has abdominal wall hernia.  Discussed with Dr. Montez Morita.  No need for emergency surgery.  Will need elective repair.  Recommendation discussed with patient and husband.  Diabetes uncontrolled likely due to IV steroid.  Decrease steroid dose.  Blood pressure stable.  Continue current treatment.  Continue home medications.  Plan discussed with patient and husband.  Consult physical therapy.      Discussed with patient. Updates regarding diagnose, tests results, prognosis, treatment  of the patient's medical conditions discussed in detail     Subjective:     Feeling some better.  Shortness of breath is improved.  Blood sugar is uncontrolled.  Patient is feeling tired..     Objective:     Visit Vitals  BP 105/75   Pulse 89   Temp 97.9 ??F (36.6 ??C)   Resp 22   Ht 5\' 8"  (1.727 m)   Wt 85.5 kg (188 lb 7.9 oz)   SpO2 98%   Breastfeeding? No   BMI 28.66 kg/m??         Intake/Output Summary (Last 24 hours) at 02/20/2018 1340  Last data filed at 02/20/2018 0630  Gross per 24 hour   Intake 1380 ml   Output 1150 ml   Net 230 ml     Constitutional:??No distress  HENT:??atraumatic, normocephalic, oropharynx clear ??  Eyes:??????conjunctiva normal  Neck:??????supple and trachea normal  Cardiovascular:?? Regular rate and rhythm, heart sounds normal, intact distal pulses   Pulmonary/Chest Wall: Moderate expiratory wheezing.  Abdominal:????????????Ventral abdominal hernia.  Nontender.  Bowel sounds audible.  Neurological:??????awake, alert and oriented, CN intact, no focal neuro deficits, moves all extremities equally .  Extremities:???? No clubbing or cyanosis. Pedal pulses 2+ b/l.  Capillary refill normal.Skin intact      Recent Results (from the past 24 hour(s))   GLUCOSE, POC    Collection Time: 02/19/18  5:35 PM   Result Value Ref Range    Glucose (POC) 325 (H) 65 - 105 mg/dL   CBC WITH AUTOMATED DIFF    Collection Time: 02/20/18  3:35 AM   Result Value Ref Range    WBC 19.4 (H) 4.0 - 11.0 1000/mm3    RBC 4.01 3.60 - 5.20 M/uL    HGB 11.5 (L) 13.0 - 17.2 gm/dl    HCT 16.1 (L) 09.6 - 50.0 %    MCV 88.5 80.0 - 98.0 fL    MCH 28.7 25.4 - 34.6 pg    MCHC 32.4 30.0 - 36.0 gm/dl    PLATELET 045 409 - 811 1000/mm3    MPV 11.3 (H) 6.0 - 10.0 fL    RDW-SD 45.1 36.4 - 46.3      NRBC 0 0 - 0      IMMATURE GRANULOCYTES 0.6 0.0 - 3.0 %  NEUTROPHILS 91.8 (H) 34 - 64 %    LYMPHOCYTES 3.2 (L) 28 - 48 %    MONOCYTES 4.3 1 - 13 %    EOSINOPHILS 0.0 0 - 5 %    BASOPHILS 0.1 0 - 3 %   METABOLIC PANEL, BASIC    Collection Time: 02/20/18  3:35 AM   Result Value Ref Range    Sodium 135 (L) 136 - 145 mEq/L    Potassium 5.1 3.5 - 5.1 mEq/L    Chloride 104 98 - 107 mEq/L    CO2 25 21 - 32 mEq/L    Glucose 243 (H) 74 - 106 mg/dl    BUN 38 (H) 7 - 25 mg/dl    Creatinine 1.0 0.6 - 1.3 mg/dl    GFR est AA >16.1      GFR est non-AA 59      Calcium 8.5 8.5 - 10.1 mg/dl    Anion gap 6 5 - 15 mmol/L   GLUCOSE, POC    Collection Time: 02/20/18  7:38 AM   Result Value Ref Range    Glucose (POC) 242 (H) 65 - 105 mg/dL       All Micro Results     None          Imaging:    Xr Chest Sngl V    Result Date: 02/18/2018  INDICATION: Respiratory failure   EXAMINATION: XR CHEST SNGL V COMPARISON: 02/04/2018 FINDINGS: The study shows a normal sized heart.. Stable 17 mm nodule at the left lower lobe.. Bullous emphysema.      IMPRESSION: 1. Stable 17 mm nodule at the left lower lobe. 2. Bullous emphysema.     Xr Chest Pa Lat    Result Date: 02/04/2018  INDICATION: shortness of breath   EXAMINATION: XR CHEST PA LAT COMPARISON: 01/15/2018 FINDINGS: The study shows a normal sized heart.. Stable 19 mm nodule at the left lower lobe.. Left lingular scarring.     IMPRESSION: Left basilar scarring and stable 19 mm nodule at the left lower lobe.     Cta Chest W Or W Wo Cont    Result Date: 02/18/2018  EXAM: CTA CHEST W OR W WO CONT INDICATION: Respiratory failure, with elevated d-dimer..    TECHNIQUE: CTA scan of the chest with IV contrast including with 3D MIP Images including coronal and sagittal images. DICOM format image data is available to non-affiliated external healthcare facilities or entities on a secure, media free, reciprocally searchable basis with patient authorization for 12 months following the date of the study. COMPARISON: No relevant prior studies available. FINDINGS: Pulmonary arteries: Unremarkable. No pulmonary embolism. Aorta: No acute findings. No thoracic aortic aneurysm or dissection. Lungs: Stable 19 mm left lower lobe lung nodule. Bullous emphysema. Lymph nodes: Unremarkable. No enlarged lymph nodes. Esophagus: Normal Pleural spaces: Unremarkable. No significant effusion. No pneumothorax. Heart: Unremarkable. No cardiomegaly. No significant pericardial effusion. No evidence of right ventricular dysfunction. Thyroid: Normal Chest wall: No abnormal findings. Bones: No fractures identified.     IMPRESSION: 1. No evidence of pulmonary embolism. 2. Stable 19 mm left lower lobe lung nodule. 3. Bullous emphysema.     Ct Abd Pelv W Cont    Result Date: 02/18/2018  EXAM: CT ABD PELV W CONT INDICATION: Worsening abdominal pain, with history of incarcerated ventral hernia.    TECHNIQUE: Axial CT scan of the abdomen and pelvis with   IV contrast    including coronal and sagittal  reconstructions.  DICOM format image data  is available to non-affiliated external healthcare facilities or entities on a secure, media free, reciprocally searchable basis with patient authorization for 12 months following the date of the study. COMPARISON: 01/29/2018. FINDINGS: ABDOMEN: Liver: Unremarkable. No mass. Gallbladder and bile ducts: Unremarkable. No calcified stones. No ductal dilatation. Pancreas: Unremarkable. No ductal dilatation. No mass. Spleen: Unchanged splenic lesions. Adrenals: Unremarkable. No mass. Kidneys and ureters: Unremarkable. No hydronephrosis. No solid mass. Appendix: No findings to suggest appendicitis. Stomach and bowel: Stomach unremarkable. No obstruction. No mucosal thickening. No inflammatory changes. Multiple sigmoid diverticula without diverticulitis. Peritoneum: Unremarkable. No significant fluid collection. No free air. Lymph nodes: Unremarkable. No enlarged lymph nodes. Vasculature: Unremarkable. No aortic aneurysm. Bones: No acute fracture. Abdominal wall: Unchanged fat and fluid containing ventral hernia. PELVIS: Bladder: Unremarkable. No mass. Reproductive: Unremarkable as visualized.     IMPRESSION: 1. Multiple sigmoid diverticula without diverticulitis. 2. Unchanged fat and fluid containing ventral hernia. 3. Unchanged splenic lesions.     Ct Abd Pelv W Cont    Result Date: 01/29/2018  DICOM format image data is available to non-affiliated external healthcare facilities or entities on a secure, media free, reciprocally searchable basis with patient authorization for 12 months following the date of the study. Clinical history: Ventral hernia, abdominal pain EXAMINATION: CT scan of the abdomen and pelvis 01/29/2018. 5 mm spiral scanning is performed from the costophrenic angles to the symphysis pubis. Coronal and sagittal reconstruction imaging has been obtained. FINDINGS: There are bilateral bullae. Osteopenia and degenerative changes of the spine. Right  hip prosthesis. There are splenic hypodensities in size from study of 08/27/2016. Spleen, adrenal glands and bladder are unremarkable. Uterus is absent. Subcentimeter renal hypodensities too small to characterize. 7 mm nonobstructing right nephrolithiasis. There are colonic diverticuli. Colon, terminal ileum, appendix, stomach and small bowel loops are within normal limits. Abdominal aorta is within normal limits. Small to borderline lymph nodes at the porta hepatis. Fat and fluid containing anterior abdominal wall hernia with surrounding inflammation suggesting incarceration.     IMPRESSION: 1. Incarcerated fat containing anterior abdominal wall hernia. 2. 7 mm nonobstructing right nephrolithiasis. 3. Diverticulosis. 4. Hysterectomy. 5. Small splenic lesions, increased in size from 08/27/2016.           Echo Results  (Last 48 hours)    None          Current Facility-Administered Medications   Medication Dose Route Frequency   ??? methylPREDNISolone (PF) (SOLU-MEDROL) injection 20 mg  20 mg IntraVENous Q12H   ??? dextrose (D50W) injection syrg 5-25 g  10-50 mL IntraVENous PRN   ??? glucagon (GLUCAGEN) injection 1 mg  1 mg IntraMUSCular PRN   ??? insulin glargine (LANTUS) injection 1-100 Units  1-100 Units SubCUTAneous QHS   ??? insulin lispro (HUMALOG) injection   SubCUTAneous AC&HS   ??? insulin lispro (HUMALOG) injection 1-100 Units  1-100 Units SubCUTAneous PRN   ??? cloNIDine HCl (CATAPRES) tablet 0.2 mg  0.2 mg Oral BID   ??? losartan (COZAAR) tablet 50 mg  50 mg Oral DAILY   ??? albuterol-ipratropium (DUO-NEB) 2.5 MG-0.5 MG/3 ML  3 mL Nebulization Q4H RT   ??? dilTIAZem CD (CARDIZEM CD) capsule 180 mg  180 mg Oral DAILY   ??? gabapentin (NEURONTIN) capsule 100 mg  100 mg Oral TID   ??? magnesium oxide (MAG-OX) tablet 400 mg  400 mg Oral DAILY   ??? montelukast (SINGULAIR) tablet 10 mg  10 mg Oral QHS   ??? QUEtiapine SR (SEROquel XR) tablet 150 mg  150  mg Oral QHS   ??? rosuvastatin (CRESTOR) tablet 5 mg  5 mg Oral QHS    ??? spironolactone (ALDACTONE) tablet 100 mg  100 mg Oral DAILY   ??? tiotropium bromide (SPIRIVA RESPIMAT) 2.5 mcg /actuation  2 Puff Inhalation DAILY   ??? traZODone (DESYREL) tablet 50 mg  50 mg Oral QHS PRN   ??? fluticasone propionate (FLONASE) 50 mcg/actuation nasal spray 2 Spray  2 Spray Both Nostrils DAILY   ??? sodium chloride (NS) flush 5-10 mL  5-10 mL IntraVENous Q8H   ??? sodium chloride (NS) flush 5-10 mL  5-10 mL IntraVENous PRN   ??? naloxone (NARCAN) injection 0.1 mg  0.1 mg IntraVENous PRN   ??? acetaminophen (TYLENOL) tablet 650 mg  650 mg Oral Q6H PRN   ??? oxyCODONE-acetaminophen (PERCOCET) 5-325 mg per tablet 1 Tab  1 Tab Oral Q6H PRN   ??? heparin (porcine) injection 5,000 Units  5,000 Units SubCUTAneous BID   ??? fluticasone-vilanterol (BREO ELLIPTA) 119mcg-25mcg/puff  1 Puff Inhalation DAILY            Dragon medical dictation software was used for portions of this report. Unintended errors may occur.   Lawana Chambers, MD  02/20/2018

## 2018-02-20 NOTE — Progress Notes (Signed)
OCCUPATIONAL THERAPY EVALUATION     Patient: [name] [name] (67 y.o. female)  Room: I412/I412    Date: 02/20/2018  Start Time:  1040        End Time:  1124    Primary Diagnosis: COPD exacerbation ([place]) [J44.1]         Precautions:  Falls. Decreased endurance and SOB.     Ordered Weight Bearing Status: None      ASSESSMENT :  Based on the objective data described below, the patient presents with   Decreased Strength  Decreased ADL/Functional Activities  Decreased Transfer Abilities  Decreased Ambulation Ability/Technique  Decreased Balance  Decreased Activity Tolerance  Decreased Work Simplification/Energy Conservation Techniques  Increased Shortness of Breath  Generalized weakness affecting function    Patient will benefit from skilled occupational therapy intervention to address the above impairments.     Patient???s rehabilitation potential is considered to be Good        PLAN :  Planned Interventions: ADI training, activity tolerance, functional balance training, functional mobility training, therapeutic exercise, therapeutic activity and energy conservation.  Frequency/Duration: Patient will be followed by occupational  therapy 1-5x/week x 2 weeks to address goals.  Discharge Recommendations: Home with family/caregiver support, Home health OT and TBD pending progress.  Further Equipment Recommendations for Discharge: Patient reports having all necessary equipment prior to d/c.     OT goals initiated 02/20/2018 and will be met by patient within 1-2 weeks in order to promote maximal independence in ADLs/IADLs.    Pt will be MODA with LB dressing sitting EOB/bedside chair w/ AE as needed.   Pt will be modified independent with UB dressing sitting w/ AE as needed.   Pt will be modified independent with toilet transfer.   Pt will be Modified independent with move from supine to sit and sit to supine  in bed.   Pt will increase dynamic standing balance to good- during functional ADL tasks.    Pt will tolerate standing for 5 minutes to promote maximal independence in ADLs.   Pt will increase bilateral  upper extremity strength to 4+/5.  Pt will demonstrate teach back of energy conservation techniques w/ Modified independent during functional activities.   Pt will report decreased pain to 2/10 or less in stomach and right leg to promote maximal independence with ADL performance.  Pt will demonstrate in room mobility with good safety and use of LRAD to promote maximal independence with ADL performance.    Please refer to Patient Education and Care Plan sections of chart for additional details.  (Patient and/or family have participated as able in goal setting and plan of care.)  EDUCATION:     Barriers to Learning/Limitations:  None  Education provided patient on  Role of OT, Benefit of activity while hospitalized, Call for assistance, Out of bed 2-3 times/day, Staff assistance with mobility, Sit out of bed for 45-60 minutes or as tolerated, ADL training and Energy conservation    SUBJECTIVE     Patient "I think getting out of bed would be a pretty good idea today".    OBJECTIVE DATA SUMMARY   Orders, labs, and chart reviewed on [name] [name]. Discussed with Debarah Crape, RN.    Patient was admitted to the hospital on 02/18/2018 with   Chief Complaint   Patient presents with   ??? Respiratory Distress     Present illness history:   Patient Active Problem List    Diagnosis Date Noted   ??? Abdominal pain 02/04/2018   ???  COPD exacerbation ([place]) 11/14/2017   ??? SIRS (systemic inflammatory response syndrome) ([place]) 10/09/2017   ??? Hyperkalemia 09/08/2017   ??? Obtunded 09/08/2017   ??? Acute renal failure (ARF) ([place]) 09/08/2017   ??? Septic shock ([facility]) 96/09/5407   ??? Metabolic encephalopathy 81/19/1478   ??? Dehydration 08/07/2017   ??? COPD with acute exacerbation ([place]) 08/07/2017   ??? Acute-on-chronic kidney injury ([place]) 08/07/2017   ??? Chest pain 06/15/2017   ??? Dyspnea 06/15/2017    ??? Type 2 diabetes mellitus with diabetic neuropathy ([place]) 12/07/2016   ??? 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 Cannabinoid hyperemesis syndrome ([place]) 08/17/2016   ??? Gastritis 08/16/2016   ??? Nausea & vomiting 08/16/2016   ??? Asthma with acute exacerbation 08/04/2016   ??? Lactic acidosis 07/24/2016   ??? Leukocytosis 07/24/2016   ??? Sepsis ([place]) 07/24/2016   ??? Asthma exacerbation 07/24/2016   ??? 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 Sacroiliitis ([place]) 11/25/2015   ??? Essential hypertension 10/06/2015   ??? 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 Lumbar and sacral osteoarthritis 09/15/2015   ??? Chronic pain syndrome 09/15/2015   ??? Type 2 diabetes mellitus with hyperglycemia, without long-term current use of insulin ([place]) 08/20/2015   ??? Acute colitis 07/02/2015   ??? Acute hyperglycemia 07/02/2015   ??? Accelerated hypertension 04/08/2015   ??? Severe headache 04/07/2015   ??? 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 PTSD (post-traumatic stress disorder) 01/29/2015   ??? Severe hypertension 07/21/2014      Previous medical history:   Past Medical History:   Diagnosis Date   ??? Arthritis    ??? 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 COPD (chronic obstructive pulmonary disease) ([place])    ??? DM2 (diabetes mellitus, type 2) ([place])    ??? GERD (gastroesophageal reflux disease)    ??? Hepatitis C     HEP C   ??? HTN (hypertension)    ??? Lung nodule, multiple     (CT 10/2016) Small left upper lobe and 1.7 x 1.6 cm left lower lobe nodules not significantly changed from 07/23/2016 and 03/22/2016   ??? Marijuana use    ??? PTSD (post-traumatic stress disorder)     lived through the [place] in 1993   ??? Thoracic ascending aortic aneurysm ([place])     4.4cm noted on CT Chest (10/2016)   ??? Thyroid nodule     2.5 cm stable right thyroid nodule (CT 10/2016)       Patient found: bed, (-) bed/chair alarm, (+) ICU/step down equipment, (+) family: husband       Pain Assessment: 5/10  Pain Location:  Stomach, right leg from prior hip replacement      PRIOR LEVEL OF FUNCTION / LIVING SITUATION     Information was obtained by:   Patient, spouse  Home environment:   Lives with husband in 2 story home with 1 STE, and bedroom on second floor, stair lift   ?? Assistance available following hospital stay:  Yes: spouse  Prior level of function: ADLs with assist, Independent with ambulation and Ambulates with device  Prior level of Instrumental Activities of Daily Living:   Family/Caregiver performs and Does not perform  Home equipment: Tub/shower with seat, grab bars, raised toilet, 2WW at various placements throughout home, one upstairs and one downstairs for ambulation short distances    [place]  Mental Status:   Oriented x3  Communication:   normal  Attention Span:   good(>55mn)  Follows Commands:   intact  General Cognition:   intact   Hearing:   grossly intact  Vision:   grossly intact    ACTIVITIES OF DAILY LIVING STATUS     Based on direct observation, simulation and clinical assessment.    Eating -  NT, patient demonstrates appropriate ROM and strength to adequately feed self  Grooming -  min. assist  UB Bathing -  min. assist and simulated  LB Bathing -  max. assist and simulated  UB Dressing -  min. assist and mod. assist  LB Dressing -  max. assist  Toileting -  dependent, pure wick catheter    FUNCTIONAL MOBILITY and BALANCE STATUS     Mobility:  Supine to sit -  min. assist  Sit to Supine -  min. assist  Sit to Stand -  min. assist, mod. assist and assistive devicesrolling walker  Stand to Sit -  min. assist, mod. assist and assistive devicesrolling walker    Transfers:  Bed to bedside chair -  mod. assist and assistive device rolling walker    Balance:  Static Sitting Balance -  good-  Dynamic Sitting Balance -  good-  Static Standing Balance -  fair+  Dynamic Standing Balance -  fair    Activity Tolerance: fair+.    UPPER EXTREMITY STATUS      Right ROM/Strength:   AROM, WFL and 4/5  Left ROM/Strength:   AROM, WFL and 4/5    Final position: Seated in bed side chair, all needs within reach, agrees to call for assistance, (+) bed/chair alarm, nursing staff notified, family present   SPatrcia DollyFall Risk: Total Score: 3 (02/20/18 1000)    Evaluation Complexity: History: MEDIUM Complexity : Expanded review of history including physical, cognitive and psychosocial  history ; Examination: MEDIUM Complexity : 3-5 performance deficits relating to physical, cognitive , or psychosocial skils that result in activity limitations and / or participation restrictions; Decision Making:MEDIUM Complexity : Patient may present with comorbidities that affect occupational performnce. Miniml to moderate modification of tasks or assistance (eg, physical or verbal ) with assesment(s) is necessary to enable patient to complete evaluation   Thank you for this referral.    CJohnston Ebbs OTR/L    TREATMENT     Patient received / participated in 29 minutes of treatment immediately following evaluation and/or educational instruction during evaluation.    OBJECTIVE: Patient supine in bed upon therapist arrival, agreeable to therapy session to get up and into chair prior to lunch arrival. [facility] to sit at EOB, MIN-MODA for sit<>stand transfer from EOB to walker, MODA for transfer into bedside chair, moderate VC for proper hand, foot, and walker placement to ensure safety with task. Once seated in bedside chair, [facility] with [place]villefor LB dressing, patient states "my husband does this at home for me". Patient educated on energy conservation techniques to improve safety and independence through increasing energy levels, moderate carryover of strategy.    ASSESSMENT: Patient continues to require skilled OT services to address deficits in strength, endurance, balance, functional ADL transfers, and  ADL performance to return to PLOF before d/c to least restrictive living environment.      PLAN: Continue OT PTrosky OTR/L  Pager #: [phone]

## 2018-02-20 NOTE — Nurse Consult (Signed)
MVP with 6 admissions in the last 12 months and 4 in the last 6 months. Has health insurance and a PCP. Per Dr Carter-  Ventral hernia with no bowel involved and no change in the CAT scan from previous.  At the present time, she would benefit from elective repair, but that would require her to be truly having no problems with her chronic obstructive pulmonary disease or asthma.  This was discussed in detail with the patient.  At the present time, this would be an elective procedure.  There is no emergency to fix her anterior ventral hernia that is not incarcerated
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
## 2018-02-20 NOTE — Care Coordination-Inpatient (Signed)
MVP with 6 admissions in the last 12 months and 4 in the last 6 months. Has health insurance and a PCP. Per Dr Montez Morita-  Ventral hernia with no bowel involved and no change in the CAT scan from previous.  At the present time, she would benefit from elective repair, but that would require her to be truly having no problems with her chronic obstructive pulmonary disease or asthma.  This was discussed in detail with the patient.  At the present time, this would be an elective procedure.  There is no emergency to fix her anterior ventral hernia that is not incarcerated
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
## 2018-02-20 NOTE — Progress Notes (Signed)
Medicine Progress Note    Date of Admission: 02/18/2018  Length of Stay: 2    Assessment And Plan     COPD with acute exacerbation  Lactic acidosis  Anterior abdominal wall .  No incarceration  Diabetes  mellitus type 2 uncontrolled with hyperglycemia  Hypertension  Hyperlipidemia  Peripheral neuropathy  GERD  Chronic pain syndrome  History hepatitis C    Treatment plan  Patient admitted to hospital with severe COPD except patient was placed on BiPAP.  Currently off BiPAP.  Still has shortness of breath.  Will decrease IV steroids to 20 mg twice daily.  Add Mucinex 600 mg p.o. twice daily.  Continue nebs.  Okay to transfer to medical floor.  Patient also has abdominal wall hernia.  Discussed with Dr. Montez Morita.  No need for emergency surgery.  Will need elective repair.  Recommendation discussed with patient and husband.  Diabetes uncontrolled likely due to IV steroid.  Decrease steroid dose.  Blood pressure stable.  Continue current treatment.  Continue home medications.  Plan discussed with patient and husband.  Consult physical therapy.      Discussed with patient. Updates regarding diagnose, tests results, prognosis, treatment  of the patient's medical conditions discussed in detail     Subjective:     Feeling some better.  Shortness of breath is improved.  Blood sugar is uncontrolled.  Patient is feeling tired..     Objective:     Visit Vitals  BP 105/75   Pulse 89   Temp 97.9 ??F (36.6 ??C)   Resp 22   Ht 5\' 8"  (1.727 m)   Wt 85.5 kg (188 lb 7.9 oz)   SpO2 98%   Breastfeeding? No   BMI 28.66 kg/m??         Intake/Output Summary (Last 24 hours) at 02/20/2018 1340  Last data filed at 02/20/2018 0630  Gross per 24 hour   Intake 1380 ml   Output 1150 ml   Net 230 ml     Constitutional:??No distress  HENT:??atraumatic, normocephalic, oropharynx clear ??  Eyes:??????conjunctiva normal  Neck:??????supple and trachea normal  Cardiovascular:?? Regular rate and rhythm, heart sounds normal, intact distal pulses  Pulmonary/Chest Wall:  Moderate expiratory wheezing.  Abdominal:????????????Ventral abdominal hernia.  Nontender.  Bowel sounds audible.  Neurological:??????awake, alert and oriented, CN intact, no focal neuro deficits, moves all extremities equally .  Extremities:???? No clubbing or cyanosis. Pedal pulses 2+ b/l.  Capillary refill normal.Skin intact      Recent Results (from the past 24 hour(s))   GLUCOSE, POC    Collection Time: 02/19/18  5:35 PM   Result Value Ref Range    Glucose (POC) 325 (H) 65 - 105 mg/dL   CBC WITH AUTOMATED DIFF    Collection Time: 02/20/18  3:35 AM   Result Value Ref Range    WBC 19.4 (H) 4.0 - 11.0 1000/mm3    RBC 4.01 3.60 - 5.20 M/uL    HGB 11.5 (L) 13.0 - 17.2 gm/dl    HCT 01.7 (L) 51.0 - 50.0 %    MCV 88.5 80.0 - 98.0 fL    MCH 28.7 25.4 - 34.6 pg    MCHC 32.4 30.0 - 36.0 gm/dl    PLATELET 258 527 - 782 1000/mm3    MPV 11.3 (H) 6.0 - 10.0 fL    RDW-SD 45.1 36.4 - 46.3      NRBC 0 0 - 0      IMMATURE GRANULOCYTES 0.6 0.0 - 3.0 %  NEUTROPHILS 91.8 (H) 34 - 64 %    LYMPHOCYTES 3.2 (L) 28 - 48 %    MONOCYTES 4.3 1 - 13 %    EOSINOPHILS 0.0 0 - 5 %    BASOPHILS 0.1 0 - 3 %   METABOLIC PANEL, BASIC    Collection Time: 02/20/18  3:35 AM   Result Value Ref Range    Sodium 135 (L) 136 - 145 mEq/L    Potassium 5.1 3.5 - 5.1 mEq/L    Chloride 104 98 - 107 mEq/L    CO2 25 21 - 32 mEq/L    Glucose 243 (H) 74 - 106 mg/dl    BUN 38 (H) 7 - 25 mg/dl    Creatinine 1.0 0.6 - 1.3 mg/dl    GFR est AA >16.1      GFR est non-AA 59      Calcium 8.5 8.5 - 10.1 mg/dl    Anion gap 6 5 - 15 mmol/L   GLUCOSE, POC    Collection Time: 02/20/18  7:38 AM   Result Value Ref Range    Glucose (POC) 242 (H) 65 - 105 mg/dL       All Micro Results     None          Imaging:    Xr Chest Sngl V    Result Date: 02/18/2018  INDICATION: Respiratory failure   EXAMINATION: XR CHEST SNGL V COMPARISON: 02/04/2018 FINDINGS: The study shows a normal sized heart.. Stable 17 mm nodule at the left lower lobe.. Bullous emphysema.     IMPRESSION: 1. Stable 17 mm nodule at  the left lower lobe. 2. Bullous emphysema.     Xr Chest Pa Lat    Result Date: 02/04/2018  INDICATION: shortness of breath   EXAMINATION: XR CHEST PA LAT COMPARISON: 01/15/2018 FINDINGS: The study shows a normal sized heart.. Stable 19 mm nodule at the left lower lobe.. Left lingular scarring.     IMPRESSION: Left basilar scarring and stable 19 mm nodule at the left lower lobe.     Cta Chest W Or W Wo Cont    Result Date: 02/18/2018  EXAM: CTA CHEST W OR W WO CONT INDICATION: Respiratory failure, with elevated d-dimer..    TECHNIQUE: CTA scan of the chest with IV contrast including with 3D MIP Images including coronal and sagittal images. DICOM format image data is available to non-affiliated external healthcare facilities or entities on a secure, media free, reciprocally searchable basis with patient authorization for 12 months following the date of the study. COMPARISON: No relevant prior studies available. FINDINGS: Pulmonary arteries: Unremarkable. No pulmonary embolism. Aorta: No acute findings. No thoracic aortic aneurysm or dissection. Lungs: Stable 19 mm left lower lobe lung nodule. Bullous emphysema. Lymph nodes: Unremarkable. No enlarged lymph nodes. Esophagus: Normal Pleural spaces: Unremarkable. No significant effusion. No pneumothorax. Heart: Unremarkable. No cardiomegaly. No significant pericardial effusion. No evidence of right ventricular dysfunction. Thyroid: Normal Chest wall: No abnormal findings. Bones: No fractures identified.     IMPRESSION: 1. No evidence of pulmonary embolism. 2. Stable 19 mm left lower lobe lung nodule. 3. Bullous emphysema.     Ct Abd Pelv W Cont    Result Date: 02/18/2018  EXAM: CT ABD PELV W CONT INDICATION: Worsening abdominal pain, with history of incarcerated ventral hernia.    TECHNIQUE: Axial CT scan of the abdomen and pelvis with   IV contrast    including coronal and sagittal reconstructions.  DICOM format image data is  available to non-affiliated external healthcare  facilities or entities on a secure, media free, reciprocally searchable basis with patient authorization for 12 months following the date of the study. COMPARISON: 01/29/2018. FINDINGS: ABDOMEN: Liver: Unremarkable. No mass. Gallbladder and bile ducts: Unremarkable. No calcified stones. No ductal dilatation. Pancreas: Unremarkable. No ductal dilatation. No mass. Spleen: Unchanged splenic lesions. Adrenals: Unremarkable. No mass. Kidneys and ureters: Unremarkable. No hydronephrosis. No solid mass. Appendix: No findings to suggest appendicitis. Stomach and bowel: Stomach unremarkable. No obstruction. No mucosal thickening. No inflammatory changes. Multiple sigmoid diverticula without diverticulitis. Peritoneum: Unremarkable. No significant fluid collection. No free air. Lymph nodes: Unremarkable. No enlarged lymph nodes. Vasculature: Unremarkable. No aortic aneurysm. Bones: No acute fracture. Abdominal wall: Unchanged fat and fluid containing ventral hernia. PELVIS: Bladder: Unremarkable. No mass. Reproductive: Unremarkable as visualized.     IMPRESSION: 1. Multiple sigmoid diverticula without diverticulitis. 2. Unchanged fat and fluid containing ventral hernia. 3. Unchanged splenic lesions.     Ct Abd Pelv W Cont    Result Date: 01/29/2018  DICOM format image data is available to non-affiliated external healthcare facilities or entities on a secure, media free, reciprocally searchable basis with patient authorization for 12 months following the date of the study. Clinical history: Ventral hernia, abdominal pain EXAMINATION: CT scan of the abdomen and pelvis 01/29/2018. 5 mm spiral scanning is performed from the costophrenic angles to the symphysis pubis. Coronal and sagittal reconstruction imaging has been obtained. FINDINGS: There are bilateral bullae. Osteopenia and degenerative changes of the spine. Right hip prosthesis. There are splenic hypodensities in size from study of 08/27/2016. Spleen, adrenal glands and  bladder are unremarkable. Uterus is absent. Subcentimeter renal hypodensities too small to characterize. 7 mm nonobstructing right nephrolithiasis. There are colonic diverticuli. Colon, terminal ileum, appendix, stomach and small bowel loops are within normal limits. Abdominal aorta is within normal limits. Small to borderline lymph nodes at the porta hepatis. Fat and fluid containing anterior abdominal wall hernia with surrounding inflammation suggesting incarceration.     IMPRESSION: 1. Incarcerated fat containing anterior abdominal wall hernia. 2. 7 mm nonobstructing right nephrolithiasis. 3. Diverticulosis. 4. Hysterectomy. 5. Small splenic lesions, increased in size from 08/27/2016.           Echo Results  (Last 48 hours)    None          Current Facility-Administered Medications   Medication Dose Route Frequency   ??? methylPREDNISolone (PF) (SOLU-MEDROL) injection 20 mg  20 mg IntraVENous Q12H   ??? dextrose (D50W) injection syrg 5-25 g  10-50 mL IntraVENous PRN   ??? glucagon (GLUCAGEN) injection 1 mg  1 mg IntraMUSCular PRN   ??? insulin glargine (LANTUS) injection 1-100 Units  1-100 Units SubCUTAneous QHS   ??? insulin lispro (HUMALOG) injection   SubCUTAneous AC&HS   ??? insulin lispro (HUMALOG) injection 1-100 Units  1-100 Units SubCUTAneous PRN   ??? cloNIDine HCl (CATAPRES) tablet 0.2 mg  0.2 mg Oral BID   ??? losartan (COZAAR) tablet 50 mg  50 mg Oral DAILY   ??? albuterol-ipratropium (DUO-NEB) 2.5 MG-0.5 MG/3 ML  3 mL Nebulization Q4H RT   ??? dilTIAZem CD (CARDIZEM CD) capsule 180 mg  180 mg Oral DAILY   ??? gabapentin (NEURONTIN) capsule 100 mg  100 mg Oral TID   ??? magnesium oxide (MAG-OX) tablet 400 mg  400 mg Oral DAILY   ??? montelukast (SINGULAIR) tablet 10 mg  10 mg Oral QHS   ??? QUEtiapine SR (SEROquel XR) tablet 150 mg  150  mg Oral QHS   ??? rosuvastatin (CRESTOR) tablet 5 mg  5 mg Oral QHS   ??? spironolactone (ALDACTONE) tablet 100 mg  100 mg Oral DAILY   ??? tiotropium bromide (SPIRIVA RESPIMAT) 2.5 mcg /actuation  2  Puff Inhalation DAILY   ??? traZODone (DESYREL) tablet 50 mg  50 mg Oral QHS PRN   ??? fluticasone propionate (FLONASE) 50 mcg/actuation nasal spray 2 Spray  2 Spray Both Nostrils DAILY   ??? sodium chloride (NS) flush 5-10 mL  5-10 mL IntraVENous Q8H   ??? sodium chloride (NS) flush 5-10 mL  5-10 mL IntraVENous PRN   ??? naloxone (NARCAN) injection 0.1 mg  0.1 mg IntraVENous PRN   ??? acetaminophen (TYLENOL) tablet 650 mg  650 mg Oral Q6H PRN   ??? oxyCODONE-acetaminophen (PERCOCET) 5-325 mg per tablet 1 Tab  1 Tab Oral Q6H PRN   ??? heparin (porcine) injection 5,000 Units  5,000 Units SubCUTAneous BID   ??? fluticasone-vilanterol (BREO ELLIPTA) 149mcg-25mcg/puff  1 Puff Inhalation DAILY            Dragon medical dictation software was used for portions of this report. Unintended errors may occur.   Lawana Chambers, MD  02/20/2018

## 2018-02-21 LAB — EKG 12-LEAD
Atrial Rate: 114 {beats}/min
P Axis: 77 degrees
P-R Interval: 138 ms
Q-T Interval: 324 ms
QRS Duration: 72 ms
QTc Calculation (Bazett): 446 ms
R Axis: 66 degrees
T Axis: 71 degrees
Ventricular Rate: 114 {beats}/min

## 2018-02-21 LAB — POCT GLUCOSE
POC Glucose: 311 mg/dL — ABNORMAL HIGH (ref 65–105)
POC Glucose: 216 mg/dL — ABNORMAL HIGH (ref 65–105)
POC Glucose: 165 mg/dL — ABNORMAL HIGH (ref 65–105)
POC Glucose: 166 mg/dL — ABNORMAL HIGH (ref 65–105)
POC Glucose: 190 mg/dL — ABNORMAL HIGH (ref 65–105)

## 2018-02-21 LAB — EKG, 12 LEAD, INITIAL
Atrial Rate: 114 {beats}/min
Calculated P Axis: 77 degrees
Calculated R Axis: 66 degrees
Calculated T Axis: 71 degrees
P-R Interval: 138 ms
Q-T Interval: 324 ms
QRS Duration: 72 ms
QTC Calculation (Bezet): 446 ms
Ventricular Rate: 114 {beats}/min

## 2018-02-21 LAB — GLUCOSE, POC
Glucose (POC): 311 mg/dL — ABNORMAL HIGH (ref 65–105)
Glucose (POC): 216 mg/dL — ABNORMAL HIGH (ref 65–105)
Glucose (POC): 165 mg/dL — ABNORMAL HIGH (ref 65–105)
Glucose (POC): 166 mg/dL — ABNORMAL HIGH (ref 65–105)
Glucose (POC): 190 mg/dL — ABNORMAL HIGH (ref 65–105)

## 2018-02-21 MED ORDER — IPRATROPIUM-ALBUTEROL 2.5 MG-0.5 MG/3 ML NEB SOLUTION
2.5 mg-0.5 mg/3 ml | Freq: Four times a day (QID) | RESPIRATORY_TRACT | Status: DC
Start: 2018-02-21 — End: 2018-02-22
  Administered 2018-02-21 – 2018-02-22 (×4): via RESPIRATORY_TRACT

## 2018-02-21 MED ORDER — LOSARTAN 25 MG TAB
25 mg | Freq: Every day | ORAL | Status: DC
Start: 2018-02-21 — End: 2018-02-24
  Administered 2018-02-22 – 2018-02-24 (×3): via ORAL

## 2018-02-21 MED ORDER — DICYCLOMINE 10 MG CAP
10 mg | Freq: Four times a day (QID) | ORAL | Status: DC
Start: 2018-02-21 — End: 2018-02-24
  Administered 2018-02-21 – 2018-02-24 (×12): via ORAL

## 2018-02-21 MED ORDER — CLONIDINE 0.1 MG TAB
0.1 mg | Freq: Two times a day (BID) | ORAL | Status: DC
Start: 2018-02-21 — End: 2018-02-24
  Administered 2018-02-22 – 2018-02-24 (×6): via ORAL

## 2018-02-21 MED ORDER — PREDNISONE 20 MG TAB
20 mg | Freq: Every day | ORAL | Status: DC
Start: 2018-02-21 — End: 2018-02-24
  Administered 2018-02-22 – 2018-02-24 (×3): via ORAL

## 2018-02-21 MED ORDER — MAGNESIUM CITRATE ORAL SOLN
ORAL | Status: AC
Start: 2018-02-21 — End: 2018-02-21
  Administered 2018-02-21: 22:00:00 via ORAL

## 2018-02-21 MED ORDER — PANTOPRAZOLE 40 MG TAB, DELAYED RELEASE
40 mg | Freq: Every day | ORAL | Status: DC
Start: 2018-02-21 — End: 2018-02-24
  Administered 2018-02-21 – 2018-02-24 (×4): via ORAL

## 2018-02-21 MED FILL — IPRATROPIUM-ALBUTEROL 2.5 MG-0.5 MG/3 ML NEB SOLUTION: 2.5 mg-0.5 mg/3 ml | RESPIRATORY_TRACT | Qty: 3

## 2018-02-21 MED FILL — OXYCODONE-ACETAMINOPHEN 5 MG-325 MG TAB: 5-325 mg | ORAL | Qty: 1

## 2018-02-21 MED FILL — LOSARTAN 50 MG TAB: 50 mg | ORAL | Qty: 1

## 2018-02-21 MED FILL — GABAPENTIN 100 MG CAP: 100 mg | ORAL | Qty: 1

## 2018-02-21 MED FILL — TRAZODONE 100 MG TAB: 100 mg | ORAL | Qty: 1

## 2018-02-21 MED FILL — HEPARIN (PORCINE) 5,000 UNIT/ML IJ SOLN: 5000 unit/mL | INTRAMUSCULAR | Qty: 1

## 2018-02-21 MED FILL — DICYCLOMINE 10 MG CAP: 10 mg | ORAL | Qty: 1

## 2018-02-21 MED FILL — SOLU-MEDROL (PF) 40 MG/ML SOLUTION FOR INJECTION: 40 mg/mL | INTRAMUSCULAR | Qty: 1

## 2018-02-21 MED FILL — DILTIAZEM ER 180 MG 24 HR CAP: 180 mg | ORAL | Qty: 1

## 2018-02-21 MED FILL — PANTOPRAZOLE 40 MG TAB, DELAYED RELEASE: 40 mg | ORAL | Qty: 1

## 2018-02-21 MED FILL — BD POSIFLUSH NORMAL SALINE 0.9 % INJECTION SYRINGE: INTRAMUSCULAR | Qty: 10

## 2018-02-21 MED FILL — INSULIN LISPRO 100 UNIT/ML INJECTION: 100 unit/mL | SUBCUTANEOUS | Qty: 100

## 2018-02-21 MED FILL — MAGNESIUM CITRATE ORAL SOLN: ORAL | Qty: 296

## 2018-02-21 MED FILL — CLONIDINE 0.2 MG TAB: 0.2 mg | ORAL | Qty: 1

## 2018-02-21 MED FILL — MAGNESIUM OXIDE 400 MG TAB: 400 mg | ORAL | Qty: 1

## 2018-02-21 NOTE — Progress Notes (Signed)
Medicine Progress Note    Date of Admission: 02/18/2018  Length of Stay: 3    Assessment And Plan     COPD with acute exacerbation  Lactic acidosis  Anterior abdominal wall .  No incarceration  Diabetes  mellitus type 2 uncontrolled with hyperglycemia  Hypertension  Hyperlipidemia  Peripheral neuropathy  GERD  Chronic pain syndrome  History hepatitis C  Constipation    Treatment plan  Patient admitted to hospital with severe COPD except patient was placed on BiPAP.  Currently off BiPAP.  Shortness of breath is improving.  Discontinue IV steroid.  Start oral prednisone 20 mg daily.  Complaining of constipation.  Will order magnesium citrate.  Will start Protonix 40 mg daily.  Patient also has abdominal wall hernia.  Discussed with Dr. Montez Morita.  No need for emergency surgery.  Will need elective repair.  Recommendation discussed with patient and husband.  Diabetes uncontrolled likely due to IV steroid.  Continue insulin.  Blood pressure stable.  Continue current treatment.  Continue home medications.  Plan discussed with patient and husband.  Consult physical therapy.      Discussed with patient. Updates regarding diagnose, tests results, prognosis, treatment  of the patient's medical conditions discussed in detail     Subjective:     Shortness of breath is getting better.  Patient is constipated.  Also complaining of some abdominal pain.     Objective:     Visit Vitals  BP 124/80 (BP 1 Location: Right arm, BP Patient Position: Supine)   Pulse 97   Temp 99 ??F (37.2 ??C)   Resp 18   Ht 5\' 8"  (1.727 m)   Wt 86.3 kg (190 lb 4.1 oz)   SpO2 96%   Breastfeeding? No   BMI 28.93 kg/m??         Intake/Output Summary (Last 24 hours) at 02/21/2018 1615  Last data filed at 02/21/2018 1443  Gross per 24 hour   Intake 930 ml   Output 4500 ml   Net -3570 ml     Constitutional:??Alert oriented  HENT:??atraumatic, normocephalic, oropharynx clear ??  Eyes:??????conjunctiva normal  Neck:??????supple and trachea normal   Cardiovascular:?? Regular rate and rhythm, heart sounds normal, intact distal pulses  Pulmonary/Chest Wall: Mild wheezing  Abdominal:????????????Ventral abdominal hernia.  Nontender.  Bowel sounds audible.  Neurological:??????awake, alert and oriented, CN intact, no focal neuro deficits, moves all extremities equally .  Extremities:???? No clubbing or cyanosis. Pedal pulses 2+ b/l.  Capillary refill normal.Skin intact      Recent Results (from the past 24 hour(s))   GLUCOSE, POC    Collection Time: 02/20/18  4:52 PM   Result Value Ref Range    Glucose (POC) 203 (H) 65 - 105 mg/dL   GLUCOSE, POC    Collection Time: 02/20/18  9:26 PM   Result Value Ref Range    Glucose (POC) 311 (H) 65 - 105 mg/dL   GLUCOSE, POC    Collection Time: 02/21/18  7:53 AM   Result Value Ref Range    Glucose (POC) 165 (H) 65 - 105 mg/dL   GLUCOSE, POC    Collection Time: 02/21/18 12:32 PM   Result Value Ref Range    Glucose (POC) 166 (H) 65 - 105 mg/dL       All Micro Results     None          Imaging:    Xr Chest Sngl V    Result Date: 02/18/2018  INDICATION: Respiratory failure  EXAMINATION: XR CHEST SNGL V COMPARISON: 02/04/2018 FINDINGS: The study shows a normal sized heart.. Stable 17 mm nodule at the left lower lobe.. Bullous emphysema.     IMPRESSION: 1. Stable 17 mm nodule at the left lower lobe. 2. Bullous emphysema.     Xr Chest Pa Lat    Result Date: 02/04/2018  INDICATION: shortness of breath   EXAMINATION: XR CHEST PA LAT COMPARISON: 01/15/2018 FINDINGS: The study shows a normal sized heart.. Stable 19 mm nodule at the left lower lobe.. Left lingular scarring.     IMPRESSION: Left basilar scarring and stable 19 mm nodule at the left lower lobe.     Cta Chest W Or W Wo Cont    Result Date: 02/18/2018  EXAM: CTA CHEST W OR W WO CONT INDICATION: Respiratory failure, with elevated d-dimer..    TECHNIQUE: CTA scan of the chest with IV contrast including with 3D MIP Images including coronal and sagittal images. DICOM  format image data is available to non-affiliated external healthcare facilities or entities on a secure, media free, reciprocally searchable basis with patient authorization for 12 months following the date of the study. COMPARISON: No relevant prior studies available. FINDINGS: Pulmonary arteries: Unremarkable. No pulmonary embolism. Aorta: No acute findings. No thoracic aortic aneurysm or dissection. Lungs: Stable 19 mm left lower lobe lung nodule. Bullous emphysema. Lymph nodes: Unremarkable. No enlarged lymph nodes. Esophagus: Normal Pleural spaces: Unremarkable. No significant effusion. No pneumothorax. Heart: Unremarkable. No cardiomegaly. No significant pericardial effusion. No evidence of right ventricular dysfunction. Thyroid: Normal Chest wall: No abnormal findings. Bones: No fractures identified.     IMPRESSION: 1. No evidence of pulmonary embolism. 2. Stable 19 mm left lower lobe lung nodule. 3. Bullous emphysema.     Ct Abd Pelv W Cont    Result Date: 02/18/2018  EXAM: CT ABD PELV W CONT INDICATION: Worsening abdominal pain, with history of incarcerated ventral hernia.    TECHNIQUE: Axial CT scan of the abdomen and pelvis with   IV contrast    including coronal and sagittal reconstructions.  DICOM format image data is available to non-affiliated external healthcare facilities or entities on a secure, media free, reciprocally searchable basis with patient authorization for 12 months following the date of the study. COMPARISON: 01/29/2018. FINDINGS: ABDOMEN: Liver: Unremarkable. No mass. Gallbladder and bile ducts: Unremarkable. No calcified stones. No ductal dilatation. Pancreas: Unremarkable. No ductal dilatation. No mass. Spleen: Unchanged splenic lesions. Adrenals: Unremarkable. No mass. Kidneys and ureters: Unremarkable. No hydronephrosis. No solid mass. Appendix: No findings to suggest appendicitis. Stomach and bowel: Stomach unremarkable. No obstruction. No  mucosal thickening. No inflammatory changes. Multiple sigmoid diverticula without diverticulitis. Peritoneum: Unremarkable. No significant fluid collection. No free air. Lymph nodes: Unremarkable. No enlarged lymph nodes. Vasculature: Unremarkable. No aortic aneurysm. Bones: No acute fracture. Abdominal wall: Unchanged fat and fluid containing ventral hernia. PELVIS: Bladder: Unremarkable. No mass. Reproductive: Unremarkable as visualized.     IMPRESSION: 1. Multiple sigmoid diverticula without diverticulitis. 2. Unchanged fat and fluid containing ventral hernia. 3. Unchanged splenic lesions.     Ct Abd Pelv W Cont    Result Date: 01/29/2018  DICOM format image data is available to non-affiliated external healthcare facilities or entities on a secure, media free, reciprocally searchable basis with patient authorization for 12 months following the date of the study. Clinical history: Ventral hernia, abdominal pain EXAMINATION: CT scan of the abdomen and pelvis 01/29/2018. 5 mm spiral scanning is performed from the costophrenic angles to the symphysis pubis.  Coronal and sagittal reconstruction imaging has been obtained. FINDINGS: There are bilateral bullae. Osteopenia and degenerative changes of the spine. Right hip prosthesis. There are splenic hypodensities in size from study of 08/27/2016. Spleen, adrenal glands and bladder are unremarkable. Uterus is absent. Subcentimeter renal hypodensities too small to characterize. 7 mm nonobstructing right nephrolithiasis. There are colonic diverticuli. Colon, terminal ileum, appendix, stomach and small bowel loops are within normal limits. Abdominal aorta is within normal limits. Small to borderline lymph nodes at the porta hepatis. Fat and fluid containing anterior abdominal wall hernia with surrounding inflammation suggesting incarceration.     IMPRESSION: 1. Incarcerated fat containing anterior abdominal wall hernia.  2. 7 mm nonobstructing right nephrolithiasis. 3. Diverticulosis. 4. Hysterectomy. 5. Small splenic lesions, increased in size from 08/27/2016.           Echo Results  (Last 48 hours)    None          Current Facility-Administered Medications   Medication Dose Route Frequency   ??? albuterol-ipratropium (DUO-NEB) 2.5 MG-0.5 MG/3 ML  3 mL Nebulization Q6H RT   ??? pantoprazole (PROTONIX) tablet 40 mg  40 mg Oral ACB   ??? dicyclomine (BENTYL) capsule 10 mg  10 mg Oral QID   ??? [START ON 02/22/2018] predniSONE (DELTASONE) tablet 20 mg  20 mg Oral DAILY WITH BREAKFAST   ??? cloNIDine HCl (CATAPRES) tablet 0.1 mg  0.1 mg Oral BID   ??? [START ON 02/22/2018] losartan (COZAAR) tablet 12.5 mg  12.5 mg Oral DAILY   ??? magnesium citrate solution 296 mL  296 mL Oral NOW   ??? dextrose (D50W) injection syrg 5-25 g  10-50 mL IntraVENous PRN   ??? glucagon (GLUCAGEN) injection 1 mg  1 mg IntraMUSCular PRN   ??? insulin glargine (LANTUS) injection 1-100 Units  1-100 Units SubCUTAneous QHS   ??? insulin lispro (HUMALOG) injection   SubCUTAneous AC&HS   ??? insulin lispro (HUMALOG) injection 1-100 Units  1-100 Units SubCUTAneous PRN   ??? dilTIAZem CD (CARDIZEM CD) capsule 180 mg  180 mg Oral DAILY   ??? gabapentin (NEURONTIN) capsule 100 mg  100 mg Oral TID   ??? magnesium oxide (MAG-OX) tablet 400 mg  400 mg Oral DAILY   ??? montelukast (SINGULAIR) tablet 10 mg  10 mg Oral QHS   ??? QUEtiapine SR (SEROquel XR) tablet 150 mg  150 mg Oral QHS   ??? rosuvastatin (CRESTOR) tablet 5 mg  5 mg Oral QHS   ??? spironolactone (ALDACTONE) tablet 100 mg  100 mg Oral DAILY   ??? tiotropium bromide (SPIRIVA RESPIMAT) 2.5 mcg /actuation  2 Puff Inhalation DAILY   ??? traZODone (DESYREL) tablet 50 mg  50 mg Oral QHS PRN   ??? fluticasone propionate (FLONASE) 50 mcg/actuation nasal spray 2 Spray  2 Spray Both Nostrils DAILY   ??? sodium chloride (NS) flush 5-10 mL  5-10 mL IntraVENous Q8H   ??? sodium chloride (NS) flush 5-10 mL  5-10 mL IntraVENous PRN    ??? naloxone (NARCAN) injection 0.1 mg  0.1 mg IntraVENous PRN   ??? acetaminophen (TYLENOL) tablet 650 mg  650 mg Oral Q6H PRN   ??? oxyCODONE-acetaminophen (PERCOCET) 5-325 mg per tablet 1 Tab  1 Tab Oral Q6H PRN   ??? heparin (porcine) injection 5,000 Units  5,000 Units SubCUTAneous BID   ??? fluticasone-vilanterol (BREO ELLIPTA) 178mcg-25mcg/puff  1 Puff Inhalation DAILY            Dragon medical dictation software was used for portions of  this report. Unintended errors may occur.   Lawana Chambers, MD  02/21/2018

## 2018-02-21 NOTE — Other (Signed)
Bedside and Verbal shift change report given to Phyllis Williams, RN (oncoming nurse) by Jennifer Brown, RN (offgoing nurse). Report included the following information SBAR and Kardex.
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
## 2018-02-21 NOTE — Other (Signed)
TRANSFER - OUT REPORT:    Verbal report given to [employer] RN (name) on [name] [name]  being transferred to [facility](unit) for routine progression of care       Report consisted of patient???s Situation, Background, Assessment and   Recommendations(SBAR).     Information from the following report(s) SBAR, Kardex, Intake/Output, MAR, Recent Results, Med Rec Status and Cardiac Rhythm sr was reviewed with the receiving nurse.    Lines:   Peripheral IV 02/18/18 Anterior;Left Forearm (Active)   Site Assessment Clean, dry, & intact 02/21/2018 12:00 PM   Phlebitis Assessment 0 02/21/2018 12:00 PM   Infiltration Assessment 0 02/21/2018 12:00 PM   Dressing Status Clean, dry, & intact 02/21/2018 12:00 PM   Dressing Type Transparent 02/21/2018  9:23 AM   Hub Color/Line Status Pink;Capped 02/21/2018  9:23 AM   Action Taken Open ports on tubing capped 02/20/2018  8:01 PM   Alcohol Cap Used Yes 02/21/2018  9:23 AM       Peripheral IV 02/18/18 Right Basilic (Active)   Site Assessment Clean, dry, & intact 02/21/2018 12:00 PM   Phlebitis Assessment 0 02/21/2018 12:00 PM   Infiltration Assessment 0 02/21/2018 12:00 PM   Dressing Status Clean, dry, & intact 02/21/2018 12:00 PM   Dressing Type Transparent 02/21/2018  9:23 AM   Hub Color/Line Status Pink;Capped 02/21/2018  9:23 AM   Action Taken Open ports on tubing capped 02/20/2018  8:01 PM   Alcohol Cap Used Yes 02/21/2018  9:23 AM        Opportunity for questions and clarification was provided.      Patient transported with:   Registered Nurse with belongings husband at bedside informed of the transfer.

## 2018-02-21 NOTE — Progress Notes (Signed)
Problem: Breathing Pattern - Ineffective  Goal: *Absence of hypoxia  Outcome: Progressing Towards Goal  Goal: *Use of effective breathing techniques  Outcome: Progressing Towards Goal     Problem: Patient Education: Go to Patient Education Activity  Goal: Patient/Family Education  Outcome: Progressing Towards Goal

## 2018-02-21 NOTE — Progress Notes (Signed)
02/21/18 1600 02/21/18 1610 02/21/18 1615   RT Walking Oximetry   Stage Resting (Room Air) During Walk (Room Air) After Walk   SpO2 95 % 90 % 92 %   HR 91 bpm 98 bpm 85 bpm   Rate of Dyspnea 0 1 1   O2 Device  --  None (Room air) None (Room air)

## 2018-02-21 NOTE — Progress Notes (Signed)
Problem: Diabetes Self-Management  Goal: *Disease process and treatment process  Description  Define diabetes and identify own type of diabetes; list 3 options for treating diabetes.  Outcome: Progressing Towards Goal  Goal: *Incorporating nutritional management into lifestyle  Description  Describe effect of type, amount and timing of food on blood glucose; list 3 methods for planning meals.  Outcome: Progressing Towards Goal
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
## 2018-02-21 NOTE — Progress Notes (Signed)
02/21/18 1600 02/21/18 1610 02/21/18 1615   RT Walking Oximetry   Stage Resting (Room Air) During Walk (Room Air) After Walk   SpO2 95 % 90 % 92 %   HR 91 bpm 98 bpm 85 bpm   Rate of Dyspnea 0 1 1   O2 Device  --  None (Room air) None (Room air)

## 2018-02-21 NOTE — Progress Notes (Signed)
Medicine Progress Note    Date of Admission: 02/18/2018  Length of Stay: 3    Assessment And Plan     COPD with acute exacerbation  Lactic acidosis  Anterior abdominal wall .  No incarceration  Diabetes  mellitus type 2 uncontrolled with hyperglycemia  Hypertension  Hyperlipidemia  Peripheral neuropathy  GERD  Chronic pain syndrome  History hepatitis C  Constipation    Treatment plan  Patient admitted to hospital with severe COPD except patient was placed on BiPAP.  Currently off BiPAP.  Shortness of breath is improving.  Discontinue IV steroid.  Start oral prednisone 20 mg daily.  Complaining of constipation.  Will order magnesium citrate.  Will start Protonix 40 mg daily.  Patient also has abdominal wall hernia.  Discussed with Dr. Montez Morita.  No need for emergency surgery.  Will need elective repair.  Recommendation discussed with patient and husband.  Diabetes uncontrolled likely due to IV steroid.  Continue insulin.  Blood pressure stable.  Continue current treatment.  Continue home medications.  Plan discussed with patient and husband.  Consult physical therapy.      Discussed with patient. Updates regarding diagnose, tests results, prognosis, treatment  of the patient's medical conditions discussed in detail     Subjective:     Shortness of breath is getting better.  Patient is constipated.  Also complaining of some abdominal pain.     Objective:     Visit Vitals  BP 124/80 (BP 1 Location: Right arm, BP Patient Position: Supine)   Pulse 97   Temp 99 ??F (37.2 ??C)   Resp 18   Ht 5\' 8"  (1.727 m)   Wt 86.3 kg (190 lb 4.1 oz)   SpO2 96%   Breastfeeding? No   BMI 28.93 kg/m??         Intake/Output Summary (Last 24 hours) at 02/21/2018 1615  Last data filed at 02/21/2018 1443  Gross per 24 hour   Intake 930 ml   Output 4500 ml   Net -3570 ml     Constitutional:??Alert oriented  HENT:??atraumatic, normocephalic, oropharynx clear ??  Eyes:??????conjunctiva normal  Neck:??????supple and trachea normal  Cardiovascular:?? Regular rate and  rhythm, heart sounds normal, intact distal pulses  Pulmonary/Chest Wall: Mild wheezing  Abdominal:????????????Ventral abdominal hernia.  Nontender.  Bowel sounds audible.  Neurological:??????awake, alert and oriented, CN intact, no focal neuro deficits, moves all extremities equally .  Extremities:???? No clubbing or cyanosis. Pedal pulses 2+ b/l.  Capillary refill normal.Skin intact      Recent Results (from the past 24 hour(s))   GLUCOSE, POC    Collection Time: 02/20/18  4:52 PM   Result Value Ref Range    Glucose (POC) 203 (H) 65 - 105 mg/dL   GLUCOSE, POC    Collection Time: 02/20/18  9:26 PM   Result Value Ref Range    Glucose (POC) 311 (H) 65 - 105 mg/dL   GLUCOSE, POC    Collection Time: 02/21/18  7:53 AM   Result Value Ref Range    Glucose (POC) 165 (H) 65 - 105 mg/dL   GLUCOSE, POC    Collection Time: 02/21/18 12:32 PM   Result Value Ref Range    Glucose (POC) 166 (H) 65 - 105 mg/dL       All Micro Results     None          Imaging:    Xr Chest Sngl V    Result Date: 02/18/2018  INDICATION: Respiratory failure  EXAMINATION: XR CHEST SNGL V COMPARISON: 02/04/2018 FINDINGS: The study shows a normal sized heart.. Stable 17 mm nodule at the left lower lobe.. Bullous emphysema.     IMPRESSION: 1. Stable 17 mm nodule at the left lower lobe. 2. Bullous emphysema.     Xr Chest Pa Lat    Result Date: 02/04/2018  INDICATION: shortness of breath   EXAMINATION: XR CHEST PA LAT COMPARISON: 01/15/2018 FINDINGS: The study shows a normal sized heart.. Stable 19 mm nodule at the left lower lobe.. Left lingular scarring.     IMPRESSION: Left basilar scarring and stable 19 mm nodule at the left lower lobe.     Cta Chest W Or W Wo Cont    Result Date: 02/18/2018  EXAM: CTA CHEST W OR W WO CONT INDICATION: Respiratory failure, with elevated d-dimer..    TECHNIQUE: CTA scan of the chest with IV contrast including with 3D MIP Images including coronal and sagittal images. DICOM format image data is available to non-affiliated external healthcare  facilities or entities on a secure, media free, reciprocally searchable basis with patient authorization for 12 months following the date of the study. COMPARISON: No relevant prior studies available. FINDINGS: Pulmonary arteries: Unremarkable. No pulmonary embolism. Aorta: No acute findings. No thoracic aortic aneurysm or dissection. Lungs: Stable 19 mm left lower lobe lung nodule. Bullous emphysema. Lymph nodes: Unremarkable. No enlarged lymph nodes. Esophagus: Normal Pleural spaces: Unremarkable. No significant effusion. No pneumothorax. Heart: Unremarkable. No cardiomegaly. No significant pericardial effusion. No evidence of right ventricular dysfunction. Thyroid: Normal Chest wall: No abnormal findings. Bones: No fractures identified.     IMPRESSION: 1. No evidence of pulmonary embolism. 2. Stable 19 mm left lower lobe lung nodule. 3. Bullous emphysema.     Ct Abd Pelv W Cont    Result Date: 02/18/2018  EXAM: CT ABD PELV W CONT INDICATION: Worsening abdominal pain, with history of incarcerated ventral hernia.    TECHNIQUE: Axial CT scan of the abdomen and pelvis with   IV contrast    including coronal and sagittal reconstructions.  DICOM format image data is available to non-affiliated external healthcare facilities or entities on a secure, media free, reciprocally searchable basis with patient authorization for 12 months following the date of the study. COMPARISON: 01/29/2018. FINDINGS: ABDOMEN: Liver: Unremarkable. No mass. Gallbladder and bile ducts: Unremarkable. No calcified stones. No ductal dilatation. Pancreas: Unremarkable. No ductal dilatation. No mass. Spleen: Unchanged splenic lesions. Adrenals: Unremarkable. No mass. Kidneys and ureters: Unremarkable. No hydronephrosis. No solid mass. Appendix: No findings to suggest appendicitis. Stomach and bowel: Stomach unremarkable. No obstruction. No mucosal thickening. No inflammatory changes. Multiple sigmoid diverticula without diverticulitis. Peritoneum:  Unremarkable. No significant fluid collection. No free air. Lymph nodes: Unremarkable. No enlarged lymph nodes. Vasculature: Unremarkable. No aortic aneurysm. Bones: No acute fracture. Abdominal wall: Unchanged fat and fluid containing ventral hernia. PELVIS: Bladder: Unremarkable. No mass. Reproductive: Unremarkable as visualized.     IMPRESSION: 1. Multiple sigmoid diverticula without diverticulitis. 2. Unchanged fat and fluid containing ventral hernia. 3. Unchanged splenic lesions.     Ct Abd Pelv W Cont    Result Date: 01/29/2018  DICOM format image data is available to non-affiliated external healthcare facilities or entities on a secure, media free, reciprocally searchable basis with patient authorization for 12 months following the date of the study. Clinical history: Ventral hernia, abdominal pain EXAMINATION: CT scan of the abdomen and pelvis 01/29/2018. 5 mm spiral scanning is performed from the costophrenic angles to the symphysis pubis.  Coronal and sagittal reconstruction imaging has been obtained. FINDINGS: There are bilateral bullae. Osteopenia and degenerative changes of the spine. Right hip prosthesis. There are splenic hypodensities in size from study of 08/27/2016. Spleen, adrenal glands and bladder are unremarkable. Uterus is absent. Subcentimeter renal hypodensities too small to characterize. 7 mm nonobstructing right nephrolithiasis. There are colonic diverticuli. Colon, terminal ileum, appendix, stomach and small bowel loops are within normal limits. Abdominal aorta is within normal limits. Small to borderline lymph nodes at the porta hepatis. Fat and fluid containing anterior abdominal wall hernia with surrounding inflammation suggesting incarceration.     IMPRESSION: 1. Incarcerated fat containing anterior abdominal wall hernia. 2. 7 mm nonobstructing right nephrolithiasis. 3. Diverticulosis. 4. Hysterectomy. 5. Small splenic lesions, increased in size from 08/27/2016.           Echo Results   (Last 48 hours)    None          Current Facility-Administered Medications   Medication Dose Route Frequency   ??? albuterol-ipratropium (DUO-NEB) 2.5 MG-0.5 MG/3 ML  3 mL Nebulization Q6H RT   ??? pantoprazole (PROTONIX) tablet 40 mg  40 mg Oral ACB   ??? dicyclomine (BENTYL) capsule 10 mg  10 mg Oral QID   ??? [START ON 02/22/2018] predniSONE (DELTASONE) tablet 20 mg  20 mg Oral DAILY WITH BREAKFAST   ??? cloNIDine HCl (CATAPRES) tablet 0.1 mg  0.1 mg Oral BID   ??? [START ON 02/22/2018] losartan (COZAAR) tablet 12.5 mg  12.5 mg Oral DAILY   ??? magnesium citrate solution 296 mL  296 mL Oral NOW   ??? dextrose (D50W) injection syrg 5-25 g  10-50 mL IntraVENous PRN   ??? glucagon (GLUCAGEN) injection 1 mg  1 mg IntraMUSCular PRN   ??? insulin glargine (LANTUS) injection 1-100 Units  1-100 Units SubCUTAneous QHS   ??? insulin lispro (HUMALOG) injection   SubCUTAneous AC&HS   ??? insulin lispro (HUMALOG) injection 1-100 Units  1-100 Units SubCUTAneous PRN   ??? dilTIAZem CD (CARDIZEM CD) capsule 180 mg  180 mg Oral DAILY   ??? gabapentin (NEURONTIN) capsule 100 mg  100 mg Oral TID   ??? magnesium oxide (MAG-OX) tablet 400 mg  400 mg Oral DAILY   ??? montelukast (SINGULAIR) tablet 10 mg  10 mg Oral QHS   ??? QUEtiapine SR (SEROquel XR) tablet 150 mg  150 mg Oral QHS   ??? rosuvastatin (CRESTOR) tablet 5 mg  5 mg Oral QHS   ??? spironolactone (ALDACTONE) tablet 100 mg  100 mg Oral DAILY   ??? tiotropium bromide (SPIRIVA RESPIMAT) 2.5 mcg /actuation  2 Puff Inhalation DAILY   ??? traZODone (DESYREL) tablet 50 mg  50 mg Oral QHS PRN   ??? fluticasone propionate (FLONASE) 50 mcg/actuation nasal spray 2 Spray  2 Spray Both Nostrils DAILY   ??? sodium chloride (NS) flush 5-10 mL  5-10 mL IntraVENous Q8H   ??? sodium chloride (NS) flush 5-10 mL  5-10 mL IntraVENous PRN   ??? naloxone (NARCAN) injection 0.1 mg  0.1 mg IntraVENous PRN   ??? acetaminophen (TYLENOL) tablet 650 mg  650 mg Oral Q6H PRN   ??? oxyCODONE-acetaminophen (PERCOCET) 5-325 mg per tablet 1 Tab  1 Tab Oral Q6H  PRN   ??? heparin (porcine) injection 5,000 Units  5,000 Units SubCUTAneous BID   ??? fluticasone-vilanterol (BREO ELLIPTA) 11mcg-25mcg/puff  1 Puff Inhalation DAILY            Dragon medical dictation software was used for portions of  this report. Unintended errors may occur.   Lawana Chambers, MD  02/21/2018

## 2018-02-22 ENCOUNTER — Inpatient Hospital Stay: Admit: 2018-02-22 | Payer: MEDICARE | Primary: Physician Assistant

## 2018-02-22 LAB — POCT GLUCOSE
POC Glucose: 241 mg/dL — ABNORMAL HIGH (ref 65–105)
POC Glucose: 168 mg/dL — ABNORMAL HIGH (ref 65–105)
POC Glucose: 236 mg/dL — ABNORMAL HIGH (ref 65–105)
POC Glucose: 267 mg/dL — ABNORMAL HIGH (ref 65–105)

## 2018-02-22 LAB — GLUCOSE, POC
Glucose (POC): 241 mg/dL — ABNORMAL HIGH (ref 65–105)
Glucose (POC): 168 mg/dL — ABNORMAL HIGH (ref 65–105)
Glucose (POC): 236 mg/dL — ABNORMAL HIGH (ref 65–105)
Glucose (POC): 267 mg/dL — ABNORMAL HIGH (ref 65–105)

## 2018-02-22 MED ORDER — IOPAMIDOL 76 % IV SOLN
370 mg iodine /mL (76 %) | Freq: Once | INTRAVENOUS | Status: AC
Start: 2018-02-22 — End: 2018-02-22
  Administered 2018-02-23: via INTRAVENOUS

## 2018-02-22 MED ORDER — ALBUTEROL SULFATE 0.083 % (0.83 MG/ML) SOLN FOR INHALATION
2.5 mg /3 mL (0.083 %) | Freq: Four times a day (QID) | RESPIRATORY_TRACT | Status: DC
Start: 2018-02-22 — End: 2018-02-24
  Administered 2018-02-22 – 2018-02-24 (×9): via RESPIRATORY_TRACT

## 2018-02-22 MED ORDER — BISACODYL 10 MG RECTAL SUPPOSITORY
10 mg | RECTAL | Status: AC
Start: 2018-02-22 — End: 2018-02-22
  Administered 2018-02-22: 20:00:00 via RECTAL

## 2018-02-22 MED FILL — HEPARIN (PORCINE) 5,000 UNIT/ML IJ SOLN: 5000 unit/mL | INTRAMUSCULAR | Qty: 1

## 2018-02-22 MED FILL — OXYCODONE-ACETAMINOPHEN 5 MG-325 MG TAB: 5-325 mg | ORAL | Qty: 1

## 2018-02-22 MED FILL — IPRATROPIUM-ALBUTEROL 2.5 MG-0.5 MG/3 ML NEB SOLUTION: 2.5 mg-0.5 mg/3 ml | RESPIRATORY_TRACT | Qty: 3

## 2018-02-22 MED FILL — BD POSIFLUSH NORMAL SALINE 0.9 % INJECTION SYRINGE: INTRAMUSCULAR | Qty: 10

## 2018-02-22 MED FILL — PREDNISONE 20 MG TAB: 20 mg | ORAL | Qty: 1

## 2018-02-22 MED FILL — GABAPENTIN 100 MG CAP: 100 mg | ORAL | Qty: 1

## 2018-02-22 MED FILL — ISOVUE-370  76 % INTRAVENOUS SOLUTION: 370 mg iodine /mL (76 %) | INTRAVENOUS | Qty: 80

## 2018-02-22 MED FILL — PANTOPRAZOLE 40 MG TAB, DELAYED RELEASE: 40 mg | ORAL | Qty: 1

## 2018-02-22 MED FILL — ALBUTEROL SULFATE 0.083 % (0.83 MG/ML) SOLN FOR INHALATION: 2.5 mg /3 mL (0.083 %) | RESPIRATORY_TRACT | Qty: 1

## 2018-02-22 MED FILL — DICYCLOMINE 10 MG CAP: 10 mg | ORAL | Qty: 1

## 2018-02-22 MED FILL — DILTIAZEM ER 180 MG 24 HR CAP: 180 mg | ORAL | Qty: 1

## 2018-02-22 MED FILL — LOSARTAN 25 MG TAB: 25 mg | ORAL | Qty: 1

## 2018-02-22 MED FILL — QUETIAPINE SR 50 MG 24 HR TAB: 50 mg | ORAL | Qty: 3

## 2018-02-22 MED FILL — CLONIDINE 0.1 MG TAB: 0.1 mg | ORAL | Qty: 1

## 2018-02-22 MED FILL — MONTELUKAST 10 MG TAB: 10 mg | ORAL | Qty: 1

## 2018-02-22 MED FILL — MAGNESIUM OXIDE 400 MG TAB: 400 mg | ORAL | Qty: 1

## 2018-02-22 MED FILL — BISAC-EVAC 10 MG RECTAL SUPPOSITORY: 10 mg | RECTAL | Qty: 1

## 2018-02-22 MED FILL — SPIRONOLACTONE 25 MG TAB: 25 mg | ORAL | Qty: 4

## 2018-02-22 MED FILL — ROSUVASTATIN 10 MG TAB: 10 mg | ORAL | Qty: 1

## 2018-02-22 NOTE — Progress Notes (Signed)
Chart review completed.  Patient readmitted [facility] 9/8 for COPD exacerbation.  Will follow upon discharge.

## 2018-02-22 NOTE — Progress Notes (Signed)
Bedside and Verbal shift change report given to Jesse E Baccus (oncoming nurse) by Phyllis, RN (offgoing nurse). Report included the following information SBAR, Kardex, MAR and Recent Results.

## 2018-02-22 NOTE — Progress Notes (Signed)
Attempted to do home O2 evaluation at 1445. Patient refused because she needed to drink her contrast.

## 2018-02-22 NOTE — Progress Notes (Signed)
Problem: Falls - Risk of  Goal: *Absence of Falls  Description  Document Schmid Fall Risk and appropriate interventions in the flowsheet.  Outcome: Progressing Towards Goal  Note:   Fall Risk Interventions:  Mobility Interventions: Bed/chair exit alarm, Patient to call before getting OOB    Medication Interventions: Bed/chair exit alarm, Teach patient to arise slowly    Elimination Interventions: Call light in reach, Patient to call for help with toileting needs

## 2018-02-22 NOTE — Progress Notes (Signed)
Problem: Falls - Risk of  Goal: *Absence of Falls  Description  Document Schmid Fall Risk and appropriate interventions in the flowsheet.  Outcome: Progressing Towards Goal  Note:   Fall Risk Interventions:  Mobility Interventions: Assess mobility with egress test, Bed/chair exit alarm         Medication Interventions: Assess postural VS orthostatic hypotension, Bed/chair exit alarm    Elimination Interventions: Bed/chair exit alarm, Call light in reach

## 2018-02-22 NOTE — Progress Notes (Signed)
Pt finished contrast at 1530. CT made aware
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
## 2018-02-22 NOTE — Progress Notes (Signed)
Problem: Diabetes Self-Management  Goal: *Disease process and treatment process  Description  Define diabetes and identify own type of diabetes; list 3 options for treating diabetes.  Outcome: Progressing Towards Goal  Goal: *Incorporating nutritional management into lifestyle  Description  Describe effect of type, amount and timing of food on blood glucose; list 3 methods for planning meals.  Outcome: Progressing Towards Goal  Goal: *Incorporating physical activity into lifestyle  Description  State effect of exercise on blood glucose levels.  Outcome: Progressing Towards Goal  Goal: *Developing strategies to promote health/change behavior  Description  Define the ABC's of diabetes; identify appropriate screenings, schedule and personal plan for screenings.  Outcome: Progressing Towards Goal  Goal: *Using medications safely  Description  State effect of diabetes medications on diabetes; name diabetes medication taking, action and side effects.  Outcome: Progressing Towards Goal  Goal: *Monitoring blood glucose, interpreting and using results  Description  Identify recommended blood glucose targets  and personal targets.  Outcome: Progressing Towards Goal  Goal: *Prevention, detection, treatment of acute complications  Description  List symptoms of hyper- and hypoglycemia; describe how to treat low blood sugar and actions for lowering  high blood glucose level.  Outcome: Progressing Towards Goal  Goal: *Prevention, detection and treatment of chronic complications  Description  Define the natural course of diabetes and describe the relationship of blood glucose levels to long term complications of diabetes.  Outcome: Progressing Towards Goal  Goal: *Developing strategies to address psychosocial issues  Description  Describe feelings about living with diabetes; identify support needed and support network  Outcome: Progressing Towards Goal  Goal: *Insulin pump training  Outcome: Progressing Towards Goal   Goal: *Sick day guidelines  Outcome: Progressing Towards Goal  Goal: *Patient Specific Goal (EDIT GOAL, INSERT TEXT)  Outcome: Progressing Towards Goal     Problem: Patient Education: Go to Patient Education Activity  Goal: Patient/Family Education  Outcome: Progressing Towards Goal     Problem: Falls - Risk of  Goal: *Absence of Falls  Description  Document [name] [name] Fall Risk and appropriate interventions in the flowsheet.  Outcome: Progressing Towards Goal  Note:   Fall Risk Interventions:  Mobility Interventions: Bed/chair exit alarm         Medication Interventions: Bed/chair exit alarm    Elimination Interventions: Call light in reach, Bed/chair exit alarm              Problem: Patient Education: Go to Patient Education Activity  Goal: Patient/Family Education  Outcome: Progressing Towards Goal     Problem: Gas Exchange - Impaired  Goal: *Absence of hypoxia  Outcome: Progressing Towards Goal     Problem: Patient Education: Go to Patient Education Activity  Goal: Patient/Family Education  Outcome: Progressing Towards Goal     Problem: Falls - Risk of  Goal: *Absence of Falls  Description  Document [name] [name] Fall Risk and appropriate interventions in the flowsheet.  Outcome: Progressing Towards Goal  Note:   Fall Risk Interventions:  Mobility Interventions: Bed/chair exit alarm         Medication Interventions: Bed/chair exit alarm    Elimination Interventions: Call light in reach, Bed/chair exit alarm              Problem: Patient Education: Go to Patient Education Activity  Goal: Patient/Family Education  Outcome: Progressing Towards Goal     Problem: Breathing Pattern - Ineffective  Goal: *Absence of hypoxia  Outcome: Progressing Towards Goal  Goal: *Use of effective breathing techniques  Outcome: Progressing  Towards Goal     Problem: Patient Education: Go to Patient Education Activity  Goal: Patient/Family Education  Outcome: Progressing Towards Goal

## 2018-02-22 NOTE — Other (Deleted)
PHYSICIAN???S DOCUMENTATION REQUEST   This Form is Not a Permanent Document in the Medical Record         By submitting this query, we are merely seeking further clarification of documentation to accurately reflect all conditions that you are monitoring, evaluating, treating or that extend the hospitalization or utilize additional resources of care. Please utilize your independent clinical judgment when addressing the question(s) below.    Dear Dr. Josephine IgoMehmood,    Documentation by ED provider on 02/18/18 states patient with history of COPD presented with acute shortness of breath. Physical exam reveals patient tripoding with tachypnea and accessory muscle use. Breath sounds with expiratory wheezing and poor air movement. ED diagnoses include abdominal pain, lactic acidosis, COPD exacerbation, and acute respiratory failure.     Documentation by Hospitalist in H&P on 02/18/18 states ABG was drawn in ED but was collected after patient had been initiated on BiPAP therapy. Physical exam reveals breathing effort normal. Tachypnea noted and audible wheezes in bilateral upper lobes. Admitting diagnoses include COPD with acute exacerbation, lactic acidosis, and anterior abdominal wall incarcerated fat hernia.  Diagnostics dated 02/18/18 include:    Lab Results  ? ABG with pH 7.393, pCO2 36.8, pO2 174.0, HCO3 22.5, Lactic Acid 5.40 on 40% FiO2 via BiPAP  ? CT Chest with no evidence of pulmonary embolism, stable 19 mm left lower lobe lung nodule, and bullous emphysema.     Vital Sign trend from 9/8/9 to 02/20/18  HR 64 ??? 128  SBP 86 ??? 158  DBP 60 ??? 102  RR 12 ??? 46  SpO2 > 95%      Treatments include: BiPAP, NC Oxygen (used 02/18/18 ??? 02/19/18), Duonebs, Solumedrol   -----------------------------------------------------------------------------------------------------------------  Please specify, if known, that acute respiratory failure was:    ?? Ruled in and treating/managing  ?? Ruled in and resolved   ?? Ruled out (please specify appropriate diagnosis if known):   ?? Clinically Undetermined    Was any ruled in diagnosis Present on Admission?  ?? Yes  ?? No  ?? Clinically Undetermined    PLEASE DOCUMENT ANY ADDITIONAL DIAGNOSES AND/OR SPECIFICITY IN THE PROGRESS NOTES AND/OR DISCHARGE SUMMARY.    Thank you,  Sherilyn BankerMaria Peralta, BSN, RN-BC, [facility]  Clinical Documentation Specialist   Office:  [phone]

## 2018-02-22 NOTE — Progress Notes (Signed)
Medicine Progress Note    Date of Admission: 02/18/2018  Length of Stay: 4    Assessment And Plan     COPD with acute exacerbation  Acute respiratory failure ruled out  Lactic acidosis  Anterior abdominal wall .  No incarceration  Diabetes  mellitus type 2 uncontrolled with hyperglycemia  Hypertension  Hyperlipidemia  Peripheral neuropathy  GERD  Chronic pain syndrome  History hepatitis C  Constipation    Treatment plan  Patient admitted to hospital with severe COPD except patient was placed on BiPAP.  Currently off BiPAP.  Shortness of breath is improving.  Continue oral steroid.  She is complaining of worsening abdominal pain.  Pain is below the umbilical region.  Grades it 8/10.  Patient is constant.  No bowel movement.  I will order CT abdomen pelvis for further evaluation.  Patient also has abdominal wall hernia.  Discussed with Dr. Montez Morita.  No need for emergency surgery.  Will need elective repair.  Also encouraged to ambulate.  Sugar improving after stopping IV steroids.  Continue insulin.  Blood pressure stable.  Continue current treatment.  Continue home medications.  Plan discussed with patient and husband.  Consult physical therapy.      Discussed with patient. Updates regarding diagnose, tests results, prognosis, treatment  of the patient's medical conditions discussed in detail     Subjective:     Complaining of worsening lower abdominal pain.  Grades it 8 out of 10.  Pain is constant.  Worse when she moves.  Some improvement with pain medicines.  Patient is still constipated.  Shortness of breath is better.     Objective:     Visit Vitals  BP (!) 126/93 (BP 1 Location: Right arm, BP Patient Position: Supine)   Pulse 98   Temp 98.5 ??F (36.9 ??C)   Resp 18   Ht 5\' 8"  (1.727 m)   Wt 86.3 kg (190 lb 4.1 oz)   SpO2 99%   Breastfeeding? No   BMI 28.93 kg/m??         Intake/Output Summary (Last 24 hours) at 02/22/2018 1321  Last data filed at 02/22/2018 1113  Gross per 24 hour   Intake 240 ml   Output 2400 ml    Net -2160 ml     Constitutional:??Alert oriented  HENT:??atraumatic, normocephalic, oropharynx clear ??  Eyes:??????conjunctiva normal  Neck:??????supple and trachea normal  Cardiovascular:??S1, S2 tachycardia  Pulmonary/Chest Wall: Mild wheezing  Abdominal:????????????Ventral abdominal hernia.  Tenderness below the umbilicus region.  Bowel sounds audible.  Neurological:??????awake, alert and oriented, CN intact, no focal neuro deficits, moves all extremities equally .  Extremities:???? No clubbing or cyanosis. Pedal pulses 2+ b/l.  Capillary refill normal.Skin intact      Recent Results (from the past 24 hour(s))   GLUCOSE, POC    Collection Time: 02/21/18  5:12 PM   Result Value Ref Range    Glucose (POC) 190 (H) 65 - 105 mg/dL   GLUCOSE, POC    Collection Time: 02/21/18  9:37 PM   Result Value Ref Range    Glucose (POC) 241 (H) 65 - 105 mg/dL   GLUCOSE, POC    Collection Time: 02/22/18  8:05 AM   Result Value Ref Range    Glucose (POC) 168 (H) 65 - 105 mg/dL       All Micro Results     None          Imaging:    Xr Chest Sngl V    Result  Date: 02/18/2018  INDICATION: Respiratory failure   EXAMINATION: XR CHEST SNGL V COMPARISON: 02/04/2018 FINDINGS: The study shows a normal sized heart.. Stable 17 mm nodule at the left lower lobe.. Bullous emphysema.     IMPRESSION: 1. Stable 17 mm nodule at the left lower lobe. 2. Bullous emphysema.     Xr Chest Pa Lat    Result Date: 02/04/2018  INDICATION: shortness of breath   EXAMINATION: XR CHEST PA LAT COMPARISON: 01/15/2018 FINDINGS: The study shows a normal sized heart.. Stable 19 mm nodule at the left lower lobe.. Left lingular scarring.     IMPRESSION: Left basilar scarring and stable 19 mm nodule at the left lower lobe.     Cta Chest W Or W Wo Cont    Result Date: 02/18/2018  EXAM: CTA CHEST W OR W WO CONT INDICATION: Respiratory failure, with elevated d-dimer..    TECHNIQUE: CTA scan of the chest with IV contrast including with 3D MIP Images including coronal and sagittal images. DICOM  format image data is available to non-affiliated external healthcare facilities or entities on a secure, media free, reciprocally searchable basis with patient authorization for 12 months following the date of the study. COMPARISON: No relevant prior studies available. FINDINGS: Pulmonary arteries: Unremarkable. No pulmonary embolism. Aorta: No acute findings. No thoracic aortic aneurysm or dissection. Lungs: Stable 19 mm left lower lobe lung nodule. Bullous emphysema. Lymph nodes: Unremarkable. No enlarged lymph nodes. Esophagus: Normal Pleural spaces: Unremarkable. No significant effusion. No pneumothorax. Heart: Unremarkable. No cardiomegaly. No significant pericardial effusion. No evidence of right ventricular dysfunction. Thyroid: Normal Chest wall: No abnormal findings. Bones: No fractures identified.     IMPRESSION: 1. No evidence of pulmonary embolism. 2. Stable 19 mm left lower lobe lung nodule. 3. Bullous emphysema.     Ct Abd Pelv W Cont    Result Date: 02/18/2018  EXAM: CT ABD PELV W CONT INDICATION: Worsening abdominal pain, with history of incarcerated ventral hernia.    TECHNIQUE: Axial CT scan of the abdomen and pelvis with   IV contrast    including coronal and sagittal reconstructions.  DICOM format image data is available to non-affiliated external healthcare facilities or entities on a secure, media free, reciprocally searchable basis with patient authorization for 12 months following the date of the study. COMPARISON: 01/29/2018. FINDINGS: ABDOMEN: Liver: Unremarkable. No mass. Gallbladder and bile ducts: Unremarkable. No calcified stones. No ductal dilatation. Pancreas: Unremarkable. No ductal dilatation. No mass. Spleen: Unchanged splenic lesions. Adrenals: Unremarkable. No mass. Kidneys and ureters: Unremarkable. No hydronephrosis. No solid mass. Appendix: No findings to suggest appendicitis. Stomach and bowel: Stomach unremarkable. No obstruction. No  mucosal thickening. No inflammatory changes. Multiple sigmoid diverticula without diverticulitis. Peritoneum: Unremarkable. No significant fluid collection. No free air. Lymph nodes: Unremarkable. No enlarged lymph nodes. Vasculature: Unremarkable. No aortic aneurysm. Bones: No acute fracture. Abdominal wall: Unchanged fat and fluid containing ventral hernia. PELVIS: Bladder: Unremarkable. No mass. Reproductive: Unremarkable as visualized.     IMPRESSION: 1. Multiple sigmoid diverticula without diverticulitis. 2. Unchanged fat and fluid containing ventral hernia. 3. Unchanged splenic lesions.     Ct Abd Pelv W Cont    Result Date: 01/29/2018  DICOM format image data is available to non-affiliated external healthcare facilities or entities on a secure, media free, reciprocally searchable basis with patient authorization for 12 months following the date of the study. Clinical history: Ventral hernia, abdominal pain EXAMINATION: CT scan of the abdomen and pelvis 01/29/2018. 5 mm spiral scanning is performed  from the costophrenic angles to the symphysis pubis. Coronal and sagittal reconstruction imaging has been obtained. FINDINGS: There are bilateral bullae. Osteopenia and degenerative changes of the spine. Right hip prosthesis. There are splenic hypodensities in size from study of 08/27/2016. Spleen, adrenal glands and bladder are unremarkable. Uterus is absent. Subcentimeter renal hypodensities too small to characterize. 7 mm nonobstructing right nephrolithiasis. There are colonic diverticuli. Colon, terminal ileum, appendix, stomach and small bowel loops are within normal limits. Abdominal aorta is within normal limits. Small to borderline lymph nodes at the porta hepatis. Fat and fluid containing anterior abdominal wall hernia with surrounding inflammation suggesting incarceration.     IMPRESSION: 1. Incarcerated fat containing anterior abdominal wall hernia.  2. 7 mm nonobstructing right nephrolithiasis. 3. Diverticulosis. 4. Hysterectomy. 5. Small splenic lesions, increased in size from 08/27/2016.           Echo Results  (Last 48 hours)    None          Current Facility-Administered Medications   Medication Dose Route Frequency   ??? albuterol (PROVENTIL VENTOLIN) nebulizer solution 2.5 mg  2.5 mg Nebulization Q6H RT   ??? pantoprazole (PROTONIX) tablet 40 mg  40 mg Oral ACB   ??? dicyclomine (BENTYL) capsule 10 mg  10 mg Oral QID   ??? predniSONE (DELTASONE) tablet 20 mg  20 mg Oral DAILY WITH BREAKFAST   ??? cloNIDine HCl (CATAPRES) tablet 0.1 mg  0.1 mg Oral BID   ??? losartan (COZAAR) tablet 12.5 mg  12.5 mg Oral DAILY   ??? dextrose (D50W) injection syrg 5-25 g  10-50 mL IntraVENous PRN   ??? glucagon (GLUCAGEN) injection 1 mg  1 mg IntraMUSCular PRN   ??? insulin glargine (LANTUS) injection 1-100 Units  1-100 Units SubCUTAneous QHS   ??? insulin lispro (HUMALOG) injection   SubCUTAneous AC&HS   ??? insulin lispro (HUMALOG) injection 1-100 Units  1-100 Units SubCUTAneous PRN   ??? dilTIAZem CD (CARDIZEM CD) capsule 180 mg  180 mg Oral DAILY   ??? gabapentin (NEURONTIN) capsule 100 mg  100 mg Oral TID   ??? magnesium oxide (MAG-OX) tablet 400 mg  400 mg Oral DAILY   ??? montelukast (SINGULAIR) tablet 10 mg  10 mg Oral QHS   ??? QUEtiapine SR (SEROquel XR) tablet 150 mg  150 mg Oral QHS   ??? rosuvastatin (CRESTOR) tablet 5 mg  5 mg Oral QHS   ??? spironolactone (ALDACTONE) tablet 100 mg  100 mg Oral DAILY   ??? tiotropium bromide (SPIRIVA RESPIMAT) 2.5 mcg /actuation  2 Puff Inhalation DAILY   ??? traZODone (DESYREL) tablet 50 mg  50 mg Oral QHS PRN   ??? fluticasone propionate (FLONASE) 50 mcg/actuation nasal spray 2 Spray  2 Spray Both Nostrils DAILY   ??? sodium chloride (NS) flush 5-10 mL  5-10 mL IntraVENous Q8H   ??? sodium chloride (NS) flush 5-10 mL  5-10 mL IntraVENous PRN   ??? naloxone (NARCAN) injection 0.1 mg  0.1 mg IntraVENous PRN   ??? acetaminophen (TYLENOL) tablet 650 mg  650 mg Oral Q6H PRN    ??? oxyCODONE-acetaminophen (PERCOCET) 5-325 mg per tablet 1 Tab  1 Tab Oral Q6H PRN   ??? heparin (porcine) injection 5,000 Units  5,000 Units SubCUTAneous BID   ??? fluticasone-vilanterol (BREO ELLIPTA) 1103mcg-25mcg/puff  1 Puff Inhalation DAILY            Dragon medical dictation software was used for portions of this report. Unintended errors may occur.   [facility],  MD  02/22/2018

## 2018-02-22 NOTE — Other (Signed)
Bedside and Verbal shift change report given to Kellsie A. N. Josephs, RN (oncoming nurse) by Jesse, RN (offgoing nurse). Report included the following information SBAR, Intake/Output, MAR and Recent Results.

## 2018-02-22 NOTE — Progress Notes (Signed)
Attempted to do home O2 evaluation at 1445. Patient refused because she needed to drink her contrast.

## 2018-02-22 NOTE — Progress Notes (Signed)
Problem: Falls - Risk of  Goal: *Absence of Falls  Description  Document [name] [name] Fall Risk and appropriate interventions in the flowsheet.  Outcome: Progressing Towards Goal  Note:   Fall Risk Interventions:  Mobility Interventions: Bed/chair exit alarm, Patient to call before getting OOB    Medication Interventions: Bed/chair exit alarm, Teach patient to arise slowly    Elimination Interventions: Call light in reach, Patient to call for help with toileting needs
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
## 2018-02-22 NOTE — Progress Notes (Signed)
Problem: Falls - Risk of  Goal: *Absence of Falls  Description  Document [name] [name] Fall Risk and appropriate interventions in the flowsheet.  Outcome: Progressing Towards Goal  Note:   Fall Risk Interventions:  Mobility Interventions: Assess mobility with egress test, Bed/chair exit alarm         Medication Interventions: Assess postural VS orthostatic hypotension, Bed/chair exit alarm    Elimination Interventions: Bed/chair exit alarm, Call light in reach

## 2018-02-22 NOTE — Progress Notes (Signed)
Pt finished contrast at 1530. CT made aware

## 2018-02-22 NOTE — Progress Notes (Signed)
Medicine Progress Note    Date of Admission: 02/18/2018  Length of Stay: 4    Assessment And Plan     COPD with acute exacerbation  Acute respiratory failure ruled out  Lactic acidosis  Anterior abdominal wall .  No incarceration  Diabetes  mellitus type 2 uncontrolled with hyperglycemia  Hypertension  Hyperlipidemia  Peripheral neuropathy  GERD  Chronic pain syndrome  History hepatitis C  Constipation    Treatment plan  Patient admitted to hospital with severe COPD except patient was placed on BiPAP.  Currently off BiPAP.  Shortness of breath is improving.  Continue oral steroid.  She is complaining of worsening abdominal pain.  Pain is below the umbilical region.  Grades it 8/10.  Patient is constant.  No bowel movement.  I will order CT abdomen pelvis for further evaluation.  Patient also has abdominal wall hernia.  Discussed with Dr. Montez Moritaarter.  No need for emergency surgery.  Will need elective repair.  Also encouraged to ambulate.  Sugar improving after stopping IV steroids.  Continue insulin.  Blood pressure stable.  Continue current treatment.  Continue home medications.  Plan discussed with patient and husband.  Consult physical therapy.      Discussed with patient. Updates regarding diagnose, tests results, prognosis, treatment  of the patient's medical conditions discussed in detail     Subjective:     Complaining of worsening lower abdominal pain.  Grades it 8 out of 10.  Pain is constant.  Worse when she moves.  Some improvement with pain medicines.  Patient is still constipated.  Shortness of breath is better.     Objective:     Visit Vitals  BP (!) 126/93 (BP 1 Location: Right arm, BP Patient Position: Supine)   Pulse 98   Temp 98.5 ??F (36.9 ??C)   Resp 18   Ht 5\' 8"  (1.727 m)   Wt 86.3 kg (190 lb 4.1 oz)   SpO2 99%   Breastfeeding? No   BMI 28.93 kg/m??         Intake/Output Summary (Last 24 hours) at 02/22/2018 1321  Last data filed at 02/22/2018 1113  Gross per 24 hour   Intake 240 ml   Output 2400 ml   Net  -2160 ml     Constitutional:??Alert oriented  HENT:??atraumatic, normocephalic, oropharynx clear ??  Eyes:??????conjunctiva normal  Neck:??????supple and trachea normal  Cardiovascular:??S1, S2 tachycardia  Pulmonary/Chest Wall: Mild wheezing  Abdominal:????????????Ventral abdominal hernia.  Tenderness below the umbilicus region.  Bowel sounds audible.  Neurological:??????awake, alert and oriented, CN intact, no focal neuro deficits, moves all extremities equally .  Extremities:???? No clubbing or cyanosis. Pedal pulses 2+ b/l.  Capillary refill normal.Skin intact      Recent Results (from the past 24 hour(s))   GLUCOSE, POC    Collection Time: 02/21/18  5:12 PM   Result Value Ref Range    Glucose (POC) 190 (H) 65 - 105 mg/dL   GLUCOSE, POC    Collection Time: 02/21/18  9:37 PM   Result Value Ref Range    Glucose (POC) 241 (H) 65 - 105 mg/dL   GLUCOSE, POC    Collection Time: 02/22/18  8:05 AM   Result Value Ref Range    Glucose (POC) 168 (H) 65 - 105 mg/dL       All Micro Results     None          Imaging:    Xr Chest Sngl V    Result  Date: 02/18/2018  INDICATION: Respiratory failure   EXAMINATION: XR CHEST SNGL V COMPARISON: 02/04/2018 FINDINGS: The study shows a normal sized heart.. Stable 17 mm nodule at the left lower lobe.. Bullous emphysema.     IMPRESSION: 1. Stable 17 mm nodule at the left lower lobe. 2. Bullous emphysema.     Xr Chest Pa Lat    Result Date: 02/04/2018  INDICATION: shortness of breath   EXAMINATION: XR CHEST PA LAT COMPARISON: 01/15/2018 FINDINGS: The study shows a normal sized heart.. Stable 19 mm nodule at the left lower lobe.. Left lingular scarring.     IMPRESSION: Left basilar scarring and stable 19 mm nodule at the left lower lobe.     Cta Chest W Or W Wo Cont    Result Date: 02/18/2018  EXAM: CTA CHEST W OR W WO CONT INDICATION: Respiratory failure, with elevated d-dimer..    TECHNIQUE: CTA scan of the chest with IV contrast including with 3D MIP Images including coronal and sagittal images. DICOM format image  data is available to non-affiliated external healthcare facilities or entities on a secure, media free, reciprocally searchable basis with patient authorization for 12 months following the date of the study. COMPARISON: No relevant prior studies available. FINDINGS: Pulmonary arteries: Unremarkable. No pulmonary embolism. Aorta: No acute findings. No thoracic aortic aneurysm or dissection. Lungs: Stable 19 mm left lower lobe lung nodule. Bullous emphysema. Lymph nodes: Unremarkable. No enlarged lymph nodes. Esophagus: Normal Pleural spaces: Unremarkable. No significant effusion. No pneumothorax. Heart: Unremarkable. No cardiomegaly. No significant pericardial effusion. No evidence of right ventricular dysfunction. Thyroid: Normal Chest wall: No abnormal findings. Bones: No fractures identified.     IMPRESSION: 1. No evidence of pulmonary embolism. 2. Stable 19 mm left lower lobe lung nodule. 3. Bullous emphysema.     Ct Abd Pelv W Cont    Result Date: 02/18/2018  EXAM: CT ABD PELV W CONT INDICATION: Worsening abdominal pain, with history of incarcerated ventral hernia.    TECHNIQUE: Axial CT scan of the abdomen and pelvis with   IV contrast    including coronal and sagittal reconstructions.  DICOM format image data is available to non-affiliated external healthcare facilities or entities on a secure, media free, reciprocally searchable basis with patient authorization for 12 months following the date of the study. COMPARISON: 01/29/2018. FINDINGS: ABDOMEN: Liver: Unremarkable. No mass. Gallbladder and bile ducts: Unremarkable. No calcified stones. No ductal dilatation. Pancreas: Unremarkable. No ductal dilatation. No mass. Spleen: Unchanged splenic lesions. Adrenals: Unremarkable. No mass. Kidneys and ureters: Unremarkable. No hydronephrosis. No solid mass. Appendix: No findings to suggest appendicitis. Stomach and bowel: Stomach unremarkable. No obstruction. No mucosal thickening. No inflammatory changes. Multiple  sigmoid diverticula without diverticulitis. Peritoneum: Unremarkable. No significant fluid collection. No free air. Lymph nodes: Unremarkable. No enlarged lymph nodes. Vasculature: Unremarkable. No aortic aneurysm. Bones: No acute fracture. Abdominal wall: Unchanged fat and fluid containing ventral hernia. PELVIS: Bladder: Unremarkable. No mass. Reproductive: Unremarkable as visualized.     IMPRESSION: 1. Multiple sigmoid diverticula without diverticulitis. 2. Unchanged fat and fluid containing ventral hernia. 3. Unchanged splenic lesions.     Ct Abd Pelv W Cont    Result Date: 01/29/2018  DICOM format image data is available to non-affiliated external healthcare facilities or entities on a secure, media free, reciprocally searchable basis with patient authorization for 12 months following the date of the study. Clinical history: Ventral hernia, abdominal pain EXAMINATION: CT scan of the abdomen and pelvis 01/29/2018. 5 mm spiral scanning is performed  from the costophrenic angles to the symphysis pubis. Coronal and sagittal reconstruction imaging has been obtained. FINDINGS: There are bilateral bullae. Osteopenia and degenerative changes of the spine. Right hip prosthesis. There are splenic hypodensities in size from study of 08/27/2016. Spleen, adrenal glands and bladder are unremarkable. Uterus is absent. Subcentimeter renal hypodensities too small to characterize. 7 mm nonobstructing right nephrolithiasis. There are colonic diverticuli. Colon, terminal ileum, appendix, stomach and small bowel loops are within normal limits. Abdominal aorta is within normal limits. Small to borderline lymph nodes at the porta hepatis. Fat and fluid containing anterior abdominal wall hernia with surrounding inflammation suggesting incarceration.     IMPRESSION: 1. Incarcerated fat containing anterior abdominal wall hernia. 2. 7 mm nonobstructing right nephrolithiasis. 3. Diverticulosis. 4. Hysterectomy. 5. Small splenic lesions,  increased in size from 08/27/2016.           Echo Results  (Last 48 hours)    None          Current Facility-Administered Medications   Medication Dose Route Frequency   ??? albuterol (PROVENTIL VENTOLIN) nebulizer solution 2.5 mg  2.5 mg Nebulization Q6H RT   ??? pantoprazole (PROTONIX) tablet 40 mg  40 mg Oral ACB   ??? dicyclomine (BENTYL) capsule 10 mg  10 mg Oral QID   ??? predniSONE (DELTASONE) tablet 20 mg  20 mg Oral DAILY WITH BREAKFAST   ??? cloNIDine HCl (CATAPRES) tablet 0.1 mg  0.1 mg Oral BID   ??? losartan (COZAAR) tablet 12.5 mg  12.5 mg Oral DAILY   ??? dextrose (D50W) injection syrg 5-25 g  10-50 mL IntraVENous PRN   ??? glucagon (GLUCAGEN) injection 1 mg  1 mg IntraMUSCular PRN   ??? insulin glargine (LANTUS) injection 1-100 Units  1-100 Units SubCUTAneous QHS   ??? insulin lispro (HUMALOG) injection   SubCUTAneous AC&HS   ??? insulin lispro (HUMALOG) injection 1-100 Units  1-100 Units SubCUTAneous PRN   ??? dilTIAZem CD (CARDIZEM CD) capsule 180 mg  180 mg Oral DAILY   ??? gabapentin (NEURONTIN) capsule 100 mg  100 mg Oral TID   ??? magnesium oxide (MAG-OX) tablet 400 mg  400 mg Oral DAILY   ??? montelukast (SINGULAIR) tablet 10 mg  10 mg Oral QHS   ??? QUEtiapine SR (SEROquel XR) tablet 150 mg  150 mg Oral QHS   ??? rosuvastatin (CRESTOR) tablet 5 mg  5 mg Oral QHS   ??? spironolactone (ALDACTONE) tablet 100 mg  100 mg Oral DAILY   ??? tiotropium bromide (SPIRIVA RESPIMAT) 2.5 mcg /actuation  2 Puff Inhalation DAILY   ??? traZODone (DESYREL) tablet 50 mg  50 mg Oral QHS PRN   ??? fluticasone propionate (FLONASE) 50 mcg/actuation nasal spray 2 Spray  2 Spray Both Nostrils DAILY   ??? sodium chloride (NS) flush 5-10 mL  5-10 mL IntraVENous Q8H   ??? sodium chloride (NS) flush 5-10 mL  5-10 mL IntraVENous PRN   ??? naloxone (NARCAN) injection 0.1 mg  0.1 mg IntraVENous PRN   ??? acetaminophen (TYLENOL) tablet 650 mg  650 mg Oral Q6H PRN   ??? oxyCODONE-acetaminophen (PERCOCET) 5-325 mg per tablet 1 Tab  1 Tab Oral Q6H PRN   ??? heparin (porcine)  injection 5,000 Units  5,000 Units SubCUTAneous BID   ??? fluticasone-vilanterol (BREO ELLIPTA) 174mcg-25mcg/puff  1 Puff Inhalation DAILY            Dragon medical dictation software was used for portions of this report. Unintended errors may occur.   [facility],  MD  02/22/2018

## 2018-02-22 NOTE — Progress Notes (Signed)
Chart review completed.  Patient readmitted [facility] 9/8 for COPD exacerbation.  Will follow upon discharge.

## 2018-02-22 NOTE — Progress Notes (Signed)
Problem: Diabetes Self-Management  Goal: *Disease process and treatment process  Description  Define diabetes and identify own type of diabetes; list 3 options for treating diabetes.  Outcome: Progressing Towards Goal  Goal: *Incorporating nutritional management into lifestyle  Description  Describe effect of type, amount and timing of food on blood glucose; list 3 methods for planning meals.  Outcome: Progressing Towards Goal  Goal: *Incorporating physical activity into lifestyle  Description  State effect of exercise on blood glucose levels.  Outcome: Progressing Towards Goal  Goal: *Developing strategies to promote health/change behavior  Description  Define the ABC's of diabetes; identify appropriate screenings, schedule and personal plan for screenings.  Outcome: Progressing Towards Goal  Goal: *Using medications safely  Description  State effect of diabetes medications on diabetes; name diabetes medication taking, action and side effects.  Outcome: Progressing Towards Goal  Goal: *Monitoring blood glucose, interpreting and using results  Description  Identify recommended blood glucose targets  and personal targets.  Outcome: Progressing Towards Goal  Goal: *Prevention, detection, treatment of acute complications  Description  List symptoms of hyper- and hypoglycemia; describe how to treat low blood sugar and actions for lowering  high blood glucose level.  Outcome: Progressing Towards Goal  Goal: *Prevention, detection and treatment of chronic complications  Description  Define the natural course of diabetes and describe the relationship of blood glucose levels to long term complications of diabetes.  Outcome: Progressing Towards Goal  Goal: *Developing strategies to address psychosocial issues  Description  Describe feelings about living with diabetes; identify support needed and support network  Outcome: Progressing Towards Goal  Goal: *Insulin pump training  Outcome: Progressing Towards Goal  Goal: *Sick  day guidelines  Outcome: Progressing Towards Goal  Goal: *Patient Specific Goal (EDIT GOAL, INSERT TEXT)  Outcome: Progressing Towards Goal     Problem: Patient Education: Go to Patient Education Activity  Goal: Patient/Family Education  Outcome: Progressing Towards Goal     Problem: Falls - Risk of  Goal: *Absence of Falls  Description  Document [name] Fall Risk and appropriate interventions in the flowsheet.  Outcome: Progressing Towards Goal  Note:   Fall Risk Interventions:  Mobility Interventions: Bed/chair exit alarm         Medication Interventions: Bed/chair exit alarm    Elimination Interventions: Call light in reach, Bed/chair exit alarm              Problem: Patient Education: Go to Patient Education Activity  Goal: Patient/Family Education  Outcome: Progressing Towards Goal     Problem: Gas Exchange - Impaired  Goal: *Absence of hypoxia  Outcome: Progressing Towards Goal     Problem: Patient Education: Go to Patient Education Activity  Goal: Patient/Family Education  Outcome: Progressing Towards Goal     Problem: Falls - Risk of  Goal: *Absence of Falls  Description  Document [name] Fall Risk and appropriate interventions in the flowsheet.  Outcome: Progressing Towards Goal  Note:   Fall Risk Interventions:  Mobility Interventions: Bed/chair exit alarm         Medication Interventions: Bed/chair exit alarm    Elimination Interventions: Call light in reach, Bed/chair exit alarm              Problem: Patient Education: Go to Patient Education Activity  Goal: Patient/Family Education  Outcome: Progressing Towards Goal     Problem: Breathing Pattern - Ineffective  Goal: *Absence of hypoxia  Outcome: Progressing Towards Goal  Goal: *Use of effective breathing techniques  Outcome: Progressing  Towards Goal     Problem: Patient Education: Go to Patient Education Activity  Goal: Patient/Family Education  Outcome: Progressing Towards Goal

## 2018-02-23 LAB — POCT GLUCOSE
POC Glucose: 249 mg/dL — ABNORMAL HIGH (ref 65–105)
POC Glucose: 177 mg/dL — ABNORMAL HIGH (ref 65–105)
POC Glucose: 229 mg/dL — ABNORMAL HIGH (ref 65–105)
POC Glucose: 386 mg/dL — ABNORMAL HIGH (ref 65–105)

## 2018-02-23 LAB — GLUCOSE, POC
Glucose (POC): 249 mg/dL — ABNORMAL HIGH (ref 65–105)
Glucose (POC): 177 mg/dL — ABNORMAL HIGH (ref 65–105)
Glucose (POC): 229 mg/dL — ABNORMAL HIGH (ref 65–105)
Glucose (POC): 386 mg/dL — ABNORMAL HIGH (ref 65–105)

## 2018-02-23 MED ORDER — MINERAL OIL ENEMA
RECTAL | Status: AC
Start: 2018-02-23 — End: 2018-02-23
  Administered 2018-02-23: 14:00:00 via RECTAL

## 2018-02-23 MED ORDER — MAGNESIUM CITRATE ORAL SOLN
ORAL | Status: AC
Start: 2018-02-23 — End: 2018-02-23
  Administered 2018-02-23: 14:00:00 via ORAL

## 2018-02-23 MED FILL — ALBUTEROL SULFATE 0.083 % (0.83 MG/ML) SOLN FOR INHALATION: 2.5 mg /3 mL (0.083 %) | RESPIRATORY_TRACT | Qty: 1

## 2018-02-23 MED FILL — ROSUVASTATIN 10 MG TAB: 10 mg | ORAL | Qty: 1

## 2018-02-23 MED FILL — LOSARTAN 25 MG TAB: 25 mg | ORAL | Qty: 1

## 2018-02-23 MED FILL — OXYCODONE-ACETAMINOPHEN 5 MG-325 MG TAB: 5-325 mg | ORAL | Qty: 1

## 2018-02-23 MED FILL — CLONIDINE 0.1 MG TAB: 0.1 mg | ORAL | Qty: 1

## 2018-02-23 MED FILL — INSULIN LISPRO 100 UNIT/ML INJECTION: 100 unit/mL | SUBCUTANEOUS | Qty: 1

## 2018-02-23 MED FILL — BD POSIFLUSH NORMAL SALINE 0.9 % INJECTION SYRINGE: INTRAMUSCULAR | Qty: 10

## 2018-02-23 MED FILL — GABAPENTIN 100 MG CAP: 100 mg | ORAL | Qty: 1

## 2018-02-23 MED FILL — FLEET MINERAL OIL ENEMA: RECTAL | Qty: 133

## 2018-02-23 MED FILL — MAGNESIUM CITRATE ORAL SOLN: ORAL | Qty: 296

## 2018-02-23 MED FILL — SPIRONOLACTONE 25 MG TAB: 25 mg | ORAL | Qty: 4

## 2018-02-23 MED FILL — HEPARIN (PORCINE) 5,000 UNIT/ML IJ SOLN: 5000 unit/mL | INTRAMUSCULAR | Qty: 1

## 2018-02-23 MED FILL — PANTOPRAZOLE 40 MG TAB, DELAYED RELEASE: 40 mg | ORAL | Qty: 1

## 2018-02-23 MED FILL — DICYCLOMINE 10 MG CAP: 10 mg | ORAL | Qty: 1

## 2018-02-23 MED FILL — MONTELUKAST 10 MG TAB: 10 mg | ORAL | Qty: 1

## 2018-02-23 MED FILL — QUETIAPINE SR 50 MG 24 HR TAB: 50 mg | ORAL | Qty: 3

## 2018-02-23 MED FILL — MAGNESIUM OXIDE 400 MG TAB: 400 mg | ORAL | Qty: 1

## 2018-02-23 MED FILL — DILTIAZEM ER 180 MG 24 HR CAP: 180 mg | ORAL | Qty: 1

## 2018-02-23 MED FILL — PREDNISONE 20 MG TAB: 20 mg | ORAL | Qty: 1

## 2018-02-23 NOTE — Progress Notes (Signed)
Patient has refused home O2 evaluation.

## 2018-02-23 NOTE — Other (Signed)
Bedside shift change report given to Angelica M Macias (oncoming nurse) by Clinton (offgoing nurse). Report included the following information SBAR.

## 2018-02-23 NOTE — Progress Notes (Signed)
OCCUPATIONAL THERAPY TREATMENT       Patient: [name] [name] (67 y.o. female)  Date: 02/23/2018  Start Time:  1054 End Time: 1118    Primary Diagnosis: COPD exacerbation ([place]) [J44.1]       Admit date: 02/18/2018  Insurance: Payor: [employer] MEDICARE / Plan: [place] / Product Type: Managed Care Medicare /      Precautions: Falls.      Ordered Weight Bearing Status:None      ASSESSMENT:    Based on the objective data described below, the patient presents with     Increased Strength, Increased ADL/Functional Activities, Increased Transfer Abilities, Increased Ambulation Ability/Technique, Increased Balance, Decreased Pain and Increased Activity Tolerance    Progression toward goals:  _0     Improving appropriately and progressing toward goals   _1     Improving slowly and progressing toward goals  _2     Not making progress toward goals and plan of care will be adjusted  _3     Patient has met goals.    Patient demonstrated the following: good effort, good motivation and good response     PLAN: Continue OT POC    Patient continues to benefit from skilled intervention to address the above impairments. Continue treatment per established plan of care.    Discharge Recommendations: Home alone, Home health OT and TBD pending progress      EDUCATION:     Barriers to Learning/Limitations:  None  Education provided patient on  Role of OT, Benefit of activity while hospitalized, Staff assistance with mobility, Sit out of bed for 45-60 minutes or as tolerated and ADL training    SUBJECTIVE:     Patient "I feel like I am doing better, they gave me an enema this morning".    OBJECTIVE DATA SUMMARY:     Orders, labs, occupational therapy and chart reviewed on [name] [name]. Discussed with Angie, RN.    Last O2 entry in flow sheets:  O2 Device: Room air (02/23/18 0837)  O2 Flow Rate (L/min): 2 l/min (02/19/18 2002)    Patient found: Bed and no bed alarm.     Pain assessment: Not rated   Pain location: Cramping pain in abdomen, patient states she had an enema this morning and has been "blocked up"    Cognitive:   General Cognition:   no deficits    Activities of Daily Living:  Toileting -  contact guard    Mobility:  Supine to sit -  min. assist  Sit to Supine -  min. assist  Sit to Stand -  min. assist  Stand to Sit -  min. assist    Transfers:  toilet transfer -  contact guard and declined use of AD, "wall walking"    Balance:  Static Sitting Balance -  good-  Dynamic Sitting Balance -  good-  Static Standing Balance -  fair+  Dynamic Standing Balance -  fair    Activity Tolerance: good-.    Final Location: bedside chair, all needs close, agrees to call for assistance, nurse notified  and no chair alarm per nursing OK.    Schmid Fall Risk: Total Score: 3 (02/22/18 1950)    Johnston Ebbs, OTR/L  Pager #: [phone]

## 2018-02-23 NOTE — Other (Signed)
Paged md to report infiltrated IV but PICC RN unable to start a new IV. MD Mehmood stated ok to leave IV out.

## 2018-02-23 NOTE — Progress Notes (Signed)
[facility] Progress Note    Patient: [name] [name] MRN: [id number]  SSN: [id number]    Date of Birth: 10/23/1950  Age: 67 y.o.  Sex: female      Admit Date: 02/18/2018    LOS: 5 days     Subjective:     Some abdominal pain at hernia    Objective:     Vitals:    02/23/18 0411 02/23/18 0539 02/23/18 0738 02/23/18 0837   BP: 117/75  113/80    Pulse: 89  80    Resp: 17  20    Temp: 97.7 ??F (36.5 ??C)  98.2 ??F (36.8 ??C)    SpO2: 98%  98% 98%   Weight:  84.4 kg (186 lb 1.1 oz)     Height:            Intake and Output:  Current Shift: 09/13 0701 - 09/13 1900  In: 240 [P.O.:240]  Out: 700 [Urine:700]  Last three shifts: 09/11 1901 - 09/13 0700  In: 960 [P.O.:960]  Out: 2100 [Urine:2100]    Physical Exam:   ABDOMEN: soft, mild tnederness. Bowel sounds normal. No masses,  no organomegaly    Lab/Data Review:  Ct Abd Pelv W Cont    Result Date: 02/22/2018  EXAM: CT ABD PELV W CONT INDICATION: Severe abdominal pain    TECHNIQUE: Axial CT scan of the abdomen and pelvis with   IV contrast    including coronal and sagittal reconstructions.  DICOM format image data is available to non-affiliated external healthcare facilities or entities on a secure, media free, reciprocally searchable basis with patient authorization for 12 months following the date of the study. COMPARISON: 02/18/2018 FINDINGS: Lower thorax: Bullous emphysema. ABDOMEN: Liver: Unremarkable. No mass. Gallbladder and bile ducts: Unremarkable. No calcified stones. No ductal dilatation. Pancreas: Unremarkable. No ductal dilatation. No mass. Spleen: Unchanged multifocal lesions. Adrenals: Unremarkable. No mass. Kidneys and ureters: Unremarkable. No hydronephrosis. No solid mass. Appendix: No findings to suggest appendicitis. Stomach and bowel: Stomach unremarkable. No obstruction. No mucosal thickening. No inflammatory changes. Multiple sigmoid diverticula without diverticulitis. Constipation. Peritoneum: Unremarkable. No  significant fluid collection. No free air. Lymph nodes: Unremarkable. No enlarged lymph nodes. Vasculature: Unremarkable. No aortic aneurysm. Bones: No acute fracture. Abdominal wall: Fat-containing ventral hernia. Increased density of the fat consistent with inflammatory/ischemic changes. There is less fluid within the hernial sac. PELVIS: Bladder: Unremarkable. No mass. Reproductive: Hysterectomy.     IMPRESSION: 1. Fat containing ventral hernia with changes suggesting fat inflammation/ischemia. 2. Multiple sigmoid diverticula without diverticulitis. 3. Constipation.           Assessment:     Active Problems:    COPD exacerbation ([facility]) (11/14/2017)        Plan:     Ventral hernia  No significant pain  Follow up as outpt once cleared by patients pulmonologist    Philis Piqueavid L Ivonne [place], MD  February 23, 2018

## 2018-02-23 NOTE — Progress Notes (Addendum)
Medicine Progress Note    Date of Admission: 02/18/2018  Length of Stay: 5    Assessment And Plan     COPD with acute exacerbation  Acute respiratory failure ruled out  Lactic acidosis  Anterior abdominal wall .  No incarceration  Diabetes  mellitus type 2 uncontrolled with hyperglycemia  Hypertension  Hyperlipidemia  Peripheral neuropathy  GERD  Chronic pain syndrome  History hepatitis C  Constipation    Treatment plan  Patient admitted to hospital with severe COPD except patient was placed on BiPAP.  Currently off BiPAP.  Shortness of breath is improving.  Continue oral steroid.  Abdominal CT scan reviewed and shows fat-containing ventral abdominal hernia with some fat inflammation and  Ischemia. No Bowel present in the hernia.  Findings discussed with Dr. Montez Morita general surgeon and he recommends continue clinical monitoring and outpatient follow-up.  She is still constipated.  Will order mag citrate and Fleet enema.  Encouraged to ambulate.  Sugar improving after stopping IV steroids.  Continue insulin.  Blood pressure stable.  Continue current treatment.  Continue home medications.  Plan discussed with patient and husband.  Consult physical therapy.      Discussed with patient. Updates regarding diagnose, tests results, prognosis, treatment  of the patient's medical conditions discussed in detail     Subjective:     Still has some abdominal pain but slightly better.  Patient is still constipated.  Shortness of breath is better.     Objective:     Visit Vitals  BP 100/68 (BP 1 Location: Right arm, BP Patient Position: Supine)   Pulse 89   Temp 98.4 ??F (36.9 ??C)   Resp 20   Ht 5\' 8"  (1.727 m)   Wt 84.4 kg (186 lb 1.1 oz)   SpO2 99%   Breastfeeding? No   BMI 28.29 kg/m??         Intake/Output Summary (Last 24 hours) at 02/23/2018 1232  Last data filed at 02/23/2018 1226  Gross per 24 hour   Intake 1500 ml   Output 1201 ml   Net 299 ml     Constitutional:??Alert oriented   HENT:??atraumatic, normocephalic, oropharynx clear ??  Eyes:??????conjunctiva normal  Neck:??????supple and trachea normal  Cardiovascular:??S1, S2 no murmurs.  Pulmonary/Chest Wall: Lungs clear  Abdominal:????????????Mild tenderness.  Bowel sounds audible  Neurological:??????awake, alert and oriented, CN intact, no focal neuro deficits, moves all extremities equally .  Extremities:???? No clubbing or cyanosis. Pedal pulses 2+ b/l.  Capillary refill normal.Skin intact      Recent Results (from the past 24 hour(s))   GLUCOSE, POC    Collection Time: 02/22/18  1:10 PM   Result Value Ref Range    Glucose (POC) 236 (H) 65 - 105 mg/dL   GLUCOSE, POC    Collection Time: 02/22/18  5:59 PM   Result Value Ref Range    Glucose (POC) 267 (H) 65 - 105 mg/dL   GLUCOSE, POC    Collection Time: 02/22/18  9:30 PM   Result Value Ref Range    Glucose (POC) 249 (H) 65 - 105 mg/dL   GLUCOSE, POC    Collection Time: 02/23/18  8:56 AM   Result Value Ref Range    Glucose (POC) 177 (H) 65 - 105 mg/dL   GLUCOSE, POC    Collection Time: 02/23/18 12:21 PM   Result Value Ref Range    Glucose (POC) 229 (H) 65 - 105 mg/dL       All Micro Results  None          Imaging:    Xr Chest Sngl V    Result Date: 02/18/2018  INDICATION: Respiratory failure   EXAMINATION: XR CHEST SNGL V COMPARISON: 02/04/2018 FINDINGS: The study shows a normal sized heart.. Stable 17 mm nodule at the left lower lobe.. Bullous emphysema.     IMPRESSION: 1. Stable 17 mm nodule at the left lower lobe. 2. Bullous emphysema.     Xr Chest Pa Lat    Result Date: 02/04/2018  INDICATION: shortness of breath   EXAMINATION: XR CHEST PA LAT COMPARISON: 01/15/2018 FINDINGS: The study shows a normal sized heart.. Stable 19 mm nodule at the left lower lobe.. Left lingular scarring.     IMPRESSION: Left basilar scarring and stable 19 mm nodule at the left lower lobe.     Cta Chest W Or W Wo Cont    Result Date: 02/18/2018  EXAM: CTA CHEST W OR W WO CONT INDICATION: Respiratory failure, with  elevated d-dimer..    TECHNIQUE: CTA scan of the chest with IV contrast including with 3D MIP Images including coronal and sagittal images. DICOM format image data is available to non-affiliated external healthcare facilities or entities on a secure, media free, reciprocally searchable basis with patient authorization for 12 months following the date of the study. COMPARISON: No relevant prior studies available. FINDINGS: Pulmonary arteries: Unremarkable. No pulmonary embolism. Aorta: No acute findings. No thoracic aortic aneurysm or dissection. Lungs: Stable 19 mm left lower lobe lung nodule. Bullous emphysema. Lymph nodes: Unremarkable. No enlarged lymph nodes. Esophagus: Normal Pleural spaces: Unremarkable. No significant effusion. No pneumothorax. Heart: Unremarkable. No cardiomegaly. No significant pericardial effusion. No evidence of right ventricular dysfunction. Thyroid: Normal Chest wall: No abnormal findings. Bones: No fractures identified.     IMPRESSION: 1. No evidence of pulmonary embolism. 2. Stable 19 mm left lower lobe lung nodule. 3. Bullous emphysema.     Ct Abd Pelv W Cont    Result Date: 02/18/2018  EXAM: CT ABD PELV W CONT INDICATION: Worsening abdominal pain, with history of incarcerated ventral hernia.    TECHNIQUE: Axial CT scan of the abdomen and pelvis with   IV contrast    including coronal and sagittal reconstructions.  DICOM format image data is available to non-affiliated external healthcare facilities or entities on a secure, media free, reciprocally searchable basis with patient authorization for 12 months following the date of the study. COMPARISON: 01/29/2018. FINDINGS: ABDOMEN: Liver: Unremarkable. No mass. Gallbladder and bile ducts: Unremarkable. No calcified stones. No ductal dilatation. Pancreas: Unremarkable. No ductal dilatation. No mass. Spleen: Unchanged splenic lesions. Adrenals: Unremarkable. No mass. Kidneys and ureters: Unremarkable. No  hydronephrosis. No solid mass. Appendix: No findings to suggest appendicitis. Stomach and bowel: Stomach unremarkable. No obstruction. No mucosal thickening. No inflammatory changes. Multiple sigmoid diverticula without diverticulitis. Peritoneum: Unremarkable. No significant fluid collection. No free air. Lymph nodes: Unremarkable. No enlarged lymph nodes. Vasculature: Unremarkable. No aortic aneurysm. Bones: No acute fracture. Abdominal wall: Unchanged fat and fluid containing ventral hernia. PELVIS: Bladder: Unremarkable. No mass. Reproductive: Unremarkable as visualized.     IMPRESSION: 1. Multiple sigmoid diverticula without diverticulitis. 2. Unchanged fat and fluid containing ventral hernia. 3. Unchanged splenic lesions.     Ct Abd Pelv W Cont    Result Date: 01/29/2018  DICOM format image data is available to non-affiliated external healthcare facilities or entities on a secure, media free, reciprocally searchable basis with patient authorization for 12 months following the date of the  study. Clinical history: Ventral hernia, abdominal pain EXAMINATION: CT scan of the abdomen and pelvis 01/29/2018. 5 mm spiral scanning is performed from the costophrenic angles to the symphysis pubis. Coronal and sagittal reconstruction imaging has been obtained. FINDINGS: There are bilateral bullae. Osteopenia and degenerative changes of the spine. Right hip prosthesis. There are splenic hypodensities in size from study of 08/27/2016. Spleen, adrenal glands and bladder are unremarkable. Uterus is absent. Subcentimeter renal hypodensities too small to characterize. 7 mm nonobstructing right nephrolithiasis. There are colonic diverticuli. Colon, terminal ileum, appendix, stomach and small bowel loops are within normal limits. Abdominal aorta is within normal limits. Small to borderline lymph nodes at the porta hepatis. Fat and fluid containing anterior abdominal wall hernia with surrounding inflammation suggesting  incarceration.     IMPRESSION: 1. Incarcerated fat containing anterior abdominal wall hernia. 2. 7 mm nonobstructing right nephrolithiasis. 3. Diverticulosis. 4. Hysterectomy. 5. Small splenic lesions, increased in size from 08/27/2016.           Echo Results  (Last 48 hours)    None          Current Facility-Administered Medications   Medication Dose Route Frequency   ??? albuterol (PROVENTIL VENTOLIN) nebulizer solution 2.5 mg  2.5 mg Nebulization Q6H RT   ??? pantoprazole (PROTONIX) tablet 40 mg  40 mg Oral ACB   ??? dicyclomine (BENTYL) capsule 10 mg  10 mg Oral QID   ??? predniSONE (DELTASONE) tablet 20 mg  20 mg Oral DAILY WITH BREAKFAST   ??? cloNIDine HCl (CATAPRES) tablet 0.1 mg  0.1 mg Oral BID   ??? losartan (COZAAR) tablet 12.5 mg  12.5 mg Oral DAILY   ??? dextrose (D50W) injection syrg 5-25 g  10-50 mL IntraVENous PRN   ??? glucagon (GLUCAGEN) injection 1 mg  1 mg IntraMUSCular PRN   ??? insulin glargine (LANTUS) injection 1-100 Units  1-100 Units SubCUTAneous QHS   ??? insulin lispro (HUMALOG) injection   SubCUTAneous AC&HS   ??? insulin lispro (HUMALOG) injection 1-100 Units  1-100 Units SubCUTAneous PRN   ??? dilTIAZem CD (CARDIZEM CD) capsule 180 mg  180 mg Oral DAILY   ??? gabapentin (NEURONTIN) capsule 100 mg  100 mg Oral TID   ??? magnesium oxide (MAG-OX) tablet 400 mg  400 mg Oral DAILY   ??? montelukast (SINGULAIR) tablet 10 mg  10 mg Oral QHS   ??? QUEtiapine SR (SEROquel XR) tablet 150 mg  150 mg Oral QHS   ??? rosuvastatin (CRESTOR) tablet 5 mg  5 mg Oral QHS   ??? spironolactone (ALDACTONE) tablet 100 mg  100 mg Oral DAILY   ??? tiotropium bromide (SPIRIVA RESPIMAT) 2.5 mcg /actuation  2 Puff Inhalation DAILY   ??? traZODone (DESYREL) tablet 50 mg  50 mg Oral QHS PRN   ??? fluticasone propionate (FLONASE) 50 mcg/actuation nasal spray 2 Spray  2 Spray Both Nostrils DAILY   ??? sodium chloride (NS) flush 5-10 mL  5-10 mL IntraVENous Q8H   ??? sodium chloride (NS) flush 5-10 mL  5-10 mL IntraVENous PRN    ??? naloxone (NARCAN) injection 0.1 mg  0.1 mg IntraVENous PRN   ??? acetaminophen (TYLENOL) tablet 650 mg  650 mg Oral Q6H PRN   ??? oxyCODONE-acetaminophen (PERCOCET) 5-325 mg per tablet 1 Tab  1 Tab Oral Q6H PRN   ??? heparin (porcine) injection 5,000 Units  5,000 Units SubCUTAneous BID   ??? fluticasone-vilanterol (BREO ELLIPTA) 133mcg-25mcg/puff  1 Puff Inhalation DAILY  Dragon medical dictation software was used for portions of this report. Unintended errors may occur.   Lawana ChambersKhalid Laiyla Slagel, MD  02/23/2018

## 2018-02-23 NOTE — Other (Signed)
Bedside and Verbal shift change report given to Emily A Houle  (oncoming nurse) by Angelica (offgoing nurse). Report included the following information SBAR and Kardex.

## 2018-02-23 NOTE — Progress Notes (Signed)
Patient has refused home O2 evaluation.

## 2018-02-23 NOTE — Progress Notes (Signed)
Medicine Progress Note    Date of Admission: 02/18/2018  Length of Stay: 5    Assessment And Plan     COPD with acute exacerbation  Acute respiratory failure ruled out  Lactic acidosis  Anterior abdominal wall .  No incarceration  Diabetes  mellitus type 2 uncontrolled with hyperglycemia  Hypertension  Hyperlipidemia  Peripheral neuropathy  GERD  Chronic pain syndrome  History hepatitis C  Constipation    Treatment plan  Patient admitted to hospital with severe COPD except patient was placed on BiPAP.  Currently off BiPAP.  Shortness of breath is improving.  Continue oral steroid.  Abdominal CT scan reviewed and shows fat-containing ventral abdominal hernia with some fat inflammation and  Ischemia. No Bowel present in the hernia.  Findings discussed with Dr. Montez Morita general surgeon and he recommends continue clinical monitoring and outpatient follow-up.  She is still constipated.  Will order mag citrate and Fleet enema.  Encouraged to ambulate.  Sugar improving after stopping IV steroids.  Continue insulin.  Blood pressure stable.  Continue current treatment.  Continue home medications.  Plan discussed with patient and husband.  Consult physical therapy.      Discussed with patient. Updates regarding diagnose, tests results, prognosis, treatment  of the patient's medical conditions discussed in detail     Subjective:     Still has some abdominal pain but slightly better.  Patient is still constipated.  Shortness of breath is better.     Objective:     Visit Vitals  BP 100/68 (BP 1 Location: Right arm, BP Patient Position: Supine)   Pulse 89   Temp 98.4 ??F (36.9 ??C)   Resp 20   Ht 5\' 8"  (1.727 m)   Wt 84.4 kg (186 lb 1.1 oz)   SpO2 99%   Breastfeeding? No   BMI 28.29 kg/m??         Intake/Output Summary (Last 24 hours) at 02/23/2018 1232  Last data filed at 02/23/2018 1226  Gross per 24 hour   Intake 1500 ml   Output 1201 ml   Net 299 ml     Constitutional:??Alert oriented  HENT:??atraumatic, normocephalic, oropharynx clear  ??  Eyes:??????conjunctiva normal  Neck:??????supple and trachea normal  Cardiovascular:??S1, S2 no murmurs.  Pulmonary/Chest Wall: Lungs clear  Abdominal:????????????Mild tenderness.  Bowel sounds audible  Neurological:??????awake, alert and oriented, CN intact, no focal neuro deficits, moves all extremities equally .  Extremities:???? No clubbing or cyanosis. Pedal pulses 2+ b/l.  Capillary refill normal.Skin intact      Recent Results (from the past 24 hour(s))   GLUCOSE, POC    Collection Time: 02/22/18  1:10 PM   Result Value Ref Range    Glucose (POC) 236 (H) 65 - 105 mg/dL   GLUCOSE, POC    Collection Time: 02/22/18  5:59 PM   Result Value Ref Range    Glucose (POC) 267 (H) 65 - 105 mg/dL   GLUCOSE, POC    Collection Time: 02/22/18  9:30 PM   Result Value Ref Range    Glucose (POC) 249 (H) 65 - 105 mg/dL   GLUCOSE, POC    Collection Time: 02/23/18  8:56 AM   Result Value Ref Range    Glucose (POC) 177 (H) 65 - 105 mg/dL   GLUCOSE, POC    Collection Time: 02/23/18 12:21 PM   Result Value Ref Range    Glucose (POC) 229 (H) 65 - 105 mg/dL       All Micro Results  None          Imaging:    Xr Chest Sngl V    Result Date: 02/18/2018  INDICATION: Respiratory failure   EXAMINATION: XR CHEST SNGL V COMPARISON: 02/04/2018 FINDINGS: The study shows a normal sized heart.. Stable 17 mm nodule at the left lower lobe.. Bullous emphysema.     IMPRESSION: 1. Stable 17 mm nodule at the left lower lobe. 2. Bullous emphysema.     Xr Chest Pa Lat    Result Date: 02/04/2018  INDICATION: shortness of breath   EXAMINATION: XR CHEST PA LAT COMPARISON: 01/15/2018 FINDINGS: The study shows a normal sized heart.. Stable 19 mm nodule at the left lower lobe.. Left lingular scarring.     IMPRESSION: Left basilar scarring and stable 19 mm nodule at the left lower lobe.     Cta Chest W Or W Wo Cont    Result Date: 02/18/2018  EXAM: CTA CHEST W OR W WO CONT INDICATION: Respiratory failure, with elevated d-dimer..    TECHNIQUE: CTA scan of the chest with IV contrast  including with 3D MIP Images including coronal and sagittal images. DICOM format image data is available to non-affiliated external healthcare facilities or entities on a secure, media free, reciprocally searchable basis with patient authorization for 12 months following the date of the study. COMPARISON: No relevant prior studies available. FINDINGS: Pulmonary arteries: Unremarkable. No pulmonary embolism. Aorta: No acute findings. No thoracic aortic aneurysm or dissection. Lungs: Stable 19 mm left lower lobe lung nodule. Bullous emphysema. Lymph nodes: Unremarkable. No enlarged lymph nodes. Esophagus: Normal Pleural spaces: Unremarkable. No significant effusion. No pneumothorax. Heart: Unremarkable. No cardiomegaly. No significant pericardial effusion. No evidence of right ventricular dysfunction. Thyroid: Normal Chest wall: No abnormal findings. Bones: No fractures identified.     IMPRESSION: 1. No evidence of pulmonary embolism. 2. Stable 19 mm left lower lobe lung nodule. 3. Bullous emphysema.     Ct Abd Pelv W Cont    Result Date: 02/18/2018  EXAM: CT ABD PELV W CONT INDICATION: Worsening abdominal pain, with history of incarcerated ventral hernia.    TECHNIQUE: Axial CT scan of the abdomen and pelvis with   IV contrast    including coronal and sagittal reconstructions.  DICOM format image data is available to non-affiliated external healthcare facilities or entities on a secure, media free, reciprocally searchable basis with patient authorization for 12 months following the date of the study. COMPARISON: 01/29/2018. FINDINGS: ABDOMEN: Liver: Unremarkable. No mass. Gallbladder and bile ducts: Unremarkable. No calcified stones. No ductal dilatation. Pancreas: Unremarkable. No ductal dilatation. No mass. Spleen: Unchanged splenic lesions. Adrenals: Unremarkable. No mass. Kidneys and ureters: Unremarkable. No hydronephrosis. No solid mass. Appendix: No findings to suggest appendicitis. Stomach and bowel: Stomach  unremarkable. No obstruction. No mucosal thickening. No inflammatory changes. Multiple sigmoid diverticula without diverticulitis. Peritoneum: Unremarkable. No significant fluid collection. No free air. Lymph nodes: Unremarkable. No enlarged lymph nodes. Vasculature: Unremarkable. No aortic aneurysm. Bones: No acute fracture. Abdominal wall: Unchanged fat and fluid containing ventral hernia. PELVIS: Bladder: Unremarkable. No mass. Reproductive: Unremarkable as visualized.     IMPRESSION: 1. Multiple sigmoid diverticula without diverticulitis. 2. Unchanged fat and fluid containing ventral hernia. 3. Unchanged splenic lesions.     Ct Abd Pelv W Cont    Result Date: 01/29/2018  DICOM format image data is available to non-affiliated external healthcare facilities or entities on a secure, media free, reciprocally searchable basis with patient authorization for 12 months following the date of the  study. Clinical history: Ventral hernia, abdominal pain EXAMINATION: CT scan of the abdomen and pelvis 01/29/2018. 5 mm spiral scanning is performed from the costophrenic angles to the symphysis pubis. Coronal and sagittal reconstruction imaging has been obtained. FINDINGS: There are bilateral bullae. Osteopenia and degenerative changes of the spine. Right hip prosthesis. There are splenic hypodensities in size from study of 08/27/2016. Spleen, adrenal glands and bladder are unremarkable. Uterus is absent. Subcentimeter renal hypodensities too small to characterize. 7 mm nonobstructing right nephrolithiasis. There are colonic diverticuli. Colon, terminal ileum, appendix, stomach and small bowel loops are within normal limits. Abdominal aorta is within normal limits. Small to borderline lymph nodes at the porta hepatis. Fat and fluid containing anterior abdominal wall hernia with surrounding inflammation suggesting incarceration.     IMPRESSION: 1. Incarcerated fat containing anterior abdominal wall hernia. 2. 7 mm nonobstructing  right nephrolithiasis. 3. Diverticulosis. 4. Hysterectomy. 5. Small splenic lesions, increased in size from 08/27/2016.           Echo Results  (Last 48 hours)    None          Current Facility-Administered Medications   Medication Dose Route Frequency   ??? albuterol (PROVENTIL VENTOLIN) nebulizer solution 2.5 mg  2.5 mg Nebulization Q6H RT   ??? pantoprazole (PROTONIX) tablet 40 mg  40 mg Oral ACB   ??? dicyclomine (BENTYL) capsule 10 mg  10 mg Oral QID   ??? predniSONE (DELTASONE) tablet 20 mg  20 mg Oral DAILY WITH BREAKFAST   ??? cloNIDine HCl (CATAPRES) tablet 0.1 mg  0.1 mg Oral BID   ??? losartan (COZAAR) tablet 12.5 mg  12.5 mg Oral DAILY   ??? dextrose (D50W) injection syrg 5-25 g  10-50 mL IntraVENous PRN   ??? glucagon (GLUCAGEN) injection 1 mg  1 mg IntraMUSCular PRN   ??? insulin glargine (LANTUS) injection 1-100 Units  1-100 Units SubCUTAneous QHS   ??? insulin lispro (HUMALOG) injection   SubCUTAneous AC&HS   ??? insulin lispro (HUMALOG) injection 1-100 Units  1-100 Units SubCUTAneous PRN   ??? dilTIAZem CD (CARDIZEM CD) capsule 180 mg  180 mg Oral DAILY   ??? gabapentin (NEURONTIN) capsule 100 mg  100 mg Oral TID   ??? magnesium oxide (MAG-OX) tablet 400 mg  400 mg Oral DAILY   ??? montelukast (SINGULAIR) tablet 10 mg  10 mg Oral QHS   ??? QUEtiapine SR (SEROquel XR) tablet 150 mg  150 mg Oral QHS   ??? rosuvastatin (CRESTOR) tablet 5 mg  5 mg Oral QHS   ??? spironolactone (ALDACTONE) tablet 100 mg  100 mg Oral DAILY   ??? tiotropium bromide (SPIRIVA RESPIMAT) 2.5 mcg /actuation  2 Puff Inhalation DAILY   ??? traZODone (DESYREL) tablet 50 mg  50 mg Oral QHS PRN   ??? fluticasone propionate (FLONASE) 50 mcg/actuation nasal spray 2 Spray  2 Spray Both Nostrils DAILY   ??? sodium chloride (NS) flush 5-10 mL  5-10 mL IntraVENous Q8H   ??? sodium chloride (NS) flush 5-10 mL  5-10 mL IntraVENous PRN   ??? naloxone (NARCAN) injection 0.1 mg  0.1 mg IntraVENous PRN   ??? acetaminophen (TYLENOL) tablet 650 mg  650 mg Oral Q6H PRN   ??? oxyCODONE-acetaminophen  (PERCOCET) 5-325 mg per tablet 1 Tab  1 Tab Oral Q6H PRN   ??? heparin (porcine) injection 5,000 Units  5,000 Units SubCUTAneous BID   ??? fluticasone-vilanterol (BREO ELLIPTA) 15100mcg-25mcg/puff  1 Puff Inhalation DAILY  Dragon medical dictation software was used for portions of this report. Unintended errors may occur.   Lawana ChambersKhalid Laiyla Slagel, MD  02/23/2018

## 2018-02-23 NOTE — Progress Notes (Signed)
[facility] Progress Note    Patient: [name] ShutterMonica R [name] MRN: [id number]  SSN: [id number]    Date of Birth: 10/04/1950  Age: 67 y.o.  Sex: female      Admit Date: 02/18/2018    LOS: 5 days     Subjective:     Some abdominal pain at hernia    Objective:     Vitals:    02/23/18 0411 02/23/18 0539 02/23/18 0738 02/23/18 0837   BP: 117/75  113/80    Pulse: 89  80    Resp: 17  20    Temp: 97.7 ??F (36.5 ??C)  98.2 ??F (36.8 ??C)    SpO2: 98%  98% 98%   Weight:  84.4 kg (186 lb 1.1 oz)     Height:            Intake and Output:  Current Shift: 09/13 0701 - 09/13 1900  In: 240 [P.O.:240]  Out: 700 [Urine:700]  Last three shifts: 09/11 1901 - 09/13 0700  In: 960 [P.O.:960]  Out: 2100 [Urine:2100]    Physical Exam:   ABDOMEN: soft, mild tnederness. Bowel sounds normal. No masses,  no organomegaly    Lab/Data Review:  Ct Abd Pelv W Cont    Result Date: 02/22/2018  EXAM: CT ABD PELV W CONT INDICATION: Severe abdominal pain    TECHNIQUE: Axial CT scan of the abdomen and pelvis with   IV contrast    including coronal and sagittal reconstructions.  DICOM format image data is available to non-affiliated external healthcare facilities or entities on a secure, media free, reciprocally searchable basis with patient authorization for 12 months following the date of the study. COMPARISON: 02/18/2018 FINDINGS: Lower thorax: Bullous emphysema. ABDOMEN: Liver: Unremarkable. No mass. Gallbladder and bile ducts: Unremarkable. No calcified stones. No ductal dilatation. Pancreas: Unremarkable. No ductal dilatation. No mass. Spleen: Unchanged multifocal lesions. Adrenals: Unremarkable. No mass. Kidneys and ureters: Unremarkable. No hydronephrosis. No solid mass. Appendix: No findings to suggest appendicitis. Stomach and bowel: Stomach unremarkable. No obstruction. No mucosal thickening. No inflammatory changes. Multiple sigmoid diverticula without diverticulitis. Constipation. Peritoneum: Unremarkable. No significant fluid collection. No free  air. Lymph nodes: Unremarkable. No enlarged lymph nodes. Vasculature: Unremarkable. No aortic aneurysm. Bones: No acute fracture. Abdominal wall: Fat-containing ventral hernia. Increased density of the fat consistent with inflammatory/ischemic changes. There is less fluid within the hernial sac. PELVIS: Bladder: Unremarkable. No mass. Reproductive: Hysterectomy.     IMPRESSION: 1. Fat containing ventral hernia with changes suggesting fat inflammation/ischemia. 2. Multiple sigmoid diverticula without diverticulitis. 3. Constipation.           Assessment:     Active Problems:    COPD exacerbation (HCC) (11/14/2017)        Plan:     Ventral hernia  No significant pain  Follow up as outpt once cleared by patients pulmonologist    Philis Piqueavid L Braylee Bosher, MD  February 23, 2018

## 2018-02-23 NOTE — Progress Notes (Signed)
 OCCUPATIONAL THERAPY TREATMENT       Patient: ARALI SOMERA (67 y.o. female)  Date: 02/23/2018  Start Time:  1054 End Time: 1118    Primary Diagnosis: COPD exacerbation (HCC) [J44.1]       Admit date: 02/18/2018  Insurance: Payor: [employer] MEDICARE / Plan: [facility] [employer] MEDICARE / Product Type: Managed Care Medicare /      Precautions: Falls.      Ordered Weight Bearing Status:None      ASSESSMENT:    Based on the objective data described below, the patient presents with     Increased Strength, Increased ADL/Functional Activities, Increased Transfer Abilities, Increased Ambulation Ability/Technique, Increased Balance, Decreased Pain and Increased Activity Tolerance    Progression toward goals:  [x]     Improving appropriately and progressing toward goals   []     Improving slowly and progressing toward goals  []     Not making progress toward goals and plan of care will be adjusted  []     Patient has met goals.    Patient demonstrated the following: good effort, good motivation and good response     PLAN: Continue OT POC    Patient continues to benefit from skilled intervention to address the above impairments. Continue treatment per established plan of care.    Discharge Recommendations: Home alone, Home health OT and TBD pending progress      EDUCATION:     Barriers to Learning/Limitations:  None  Education provided patient on  Role of OT, Benefit of activity while hospitalized, Staff assistance with mobility, Sit out of bed for 45-60 minutes or as tolerated and ADL training    SUBJECTIVE:     Patient I feel like I am doing better, they gave me an enema this morning.    OBJECTIVE DATA SUMMARY:     Orders, labs, occupational therapy and chart reviewed on [facility]. Discussed with Angie, RN.    Last O2 entry in flow sheets:  O2 Device: Room air (02/23/18 0837)  O2 Flow Rate (L/min): 2 l/min (02/19/18 2002)    Patient found: Bed and no bed alarm.     Pain assessment: Not rated  Pain location: Cramping pain in abdomen,  patient states she had an enema this morning and has been blocked up    Cognitive:   General Cognition:   no deficits    Activities of Daily Living:  Toileting -  contact guard    Mobility:  Supine to sit -  min. assist  Sit to Supine -  min. assist  Sit to Stand -  min. assist  Stand to Sit -  min. assist    Transfers:  toilet transfer -  contact guard and declined use of AD, wall walking    Balance:  Static Sitting Balance -  good-  Dynamic Sitting Balance -  good-  Static Standing Balance -  fair+  Dynamic Standing Balance -  fair    Activity Tolerance: good-.    Final Location: bedside chair, all needs close, agrees to call for assistance, nurse notified  and no chair alarm per nursing OK.    Schmid Fall Risk: Total Score: 3 (02/22/18 1950)    Tawni CHRISTELLA Heimlich, OTR/L  Pager #: [phone]

## 2018-02-24 LAB — POCT GLUCOSE
POC Glucose: 206 mg/dL — ABNORMAL HIGH (ref 65–105)
POC Glucose: 267 mg/dL — ABNORMAL HIGH (ref 65–105)
POC Glucose: 256 mg/dL — ABNORMAL HIGH (ref 65–105)

## 2018-02-24 LAB — GLUCOSE, POC
Glucose (POC): 206 mg/dL — ABNORMAL HIGH (ref 65–105)
Glucose (POC): 267 mg/dL — ABNORMAL HIGH (ref 65–105)
Glucose (POC): 256 mg/dL — ABNORMAL HIGH (ref 65–105)

## 2018-02-24 MED ORDER — PANTOPRAZOLE 40 MG TAB, DELAYED RELEASE
40 mg | ORAL_TABLET | Freq: Every day | ORAL | 0 refills | Status: DC
Start: 2018-02-24 — End: 2018-08-05

## 2018-02-24 MED ORDER — CELECOXIB 200 MG CAP
200 mg | ORAL_CAPSULE | Freq: Two times a day (BID) | ORAL | 0 refills | Status: AC
Start: 2018-02-24 — End: 2018-03-26

## 2018-02-24 MED ORDER — CLONIDINE 0.1 MG TAB
0.1 mg | ORAL_TABLET | Freq: Two times a day (BID) | ORAL | 0 refills | Status: DC
Start: 2018-02-24 — End: 2018-05-02

## 2018-02-24 MED ORDER — SENNOSIDES-DOCUSATE SODIUM 8.6 MG-50 MG TAB
ORAL_TABLET | Freq: Every day | ORAL | 0 refills | Status: DC
Start: 2018-02-24 — End: 2018-08-05

## 2018-02-24 MED ORDER — LOSARTAN 25 MG TAB
25 mg | ORAL_TABLET | Freq: Every day | ORAL | 0 refills | Status: DC
Start: 2018-02-24 — End: 2018-05-02

## 2018-02-24 MED ORDER — POLYETHYLENE GLYCOL 3350 17 GRAM (100 %) ORAL POWDER PACKET
17 gram | PACK | Freq: Every day | ORAL | 1 refills | Status: DC
Start: 2018-02-24 — End: 2020-06-02

## 2018-02-24 MED FILL — DILTIAZEM ER 180 MG 24 HR CAP: 180 mg | ORAL | Qty: 1

## 2018-02-24 MED FILL — ALBUTEROL SULFATE 0.083 % (0.83 MG/ML) SOLN FOR INHALATION: 2.5 mg /3 mL (0.083 %) | RESPIRATORY_TRACT | Qty: 1

## 2018-02-24 MED FILL — QUETIAPINE SR 50 MG 24 HR TAB: 50 mg | ORAL | Qty: 3

## 2018-02-24 MED FILL — CLONIDINE 0.1 MG TAB: 0.1 mg | ORAL | Qty: 1

## 2018-02-24 MED FILL — INSULIN LISPRO 100 UNIT/ML INJECTION: 100 unit/mL | SUBCUTANEOUS | Qty: 100

## 2018-02-24 MED FILL — GABAPENTIN 100 MG CAP: 100 mg | ORAL | Qty: 1

## 2018-02-24 MED FILL — MONTELUKAST 10 MG TAB: 10 mg | ORAL | Qty: 1

## 2018-02-24 MED FILL — HEPARIN (PORCINE) 5,000 UNIT/ML IJ SOLN: 5000 unit/mL | INTRAMUSCULAR | Qty: 1

## 2018-02-24 MED FILL — LOSARTAN 25 MG TAB: 25 mg | ORAL | Qty: 1

## 2018-02-24 MED FILL — DICYCLOMINE 10 MG CAP: 10 mg | ORAL | Qty: 1

## 2018-02-24 MED FILL — MAGNESIUM OXIDE 400 MG TAB: 400 mg | ORAL | Qty: 1

## 2018-02-24 MED FILL — SPIRONOLACTONE 25 MG TAB: 25 mg | ORAL | Qty: 4

## 2018-02-24 MED FILL — ROSUVASTATIN 10 MG TAB: 10 mg | ORAL | Qty: 1

## 2018-02-24 MED FILL — OXYCODONE-ACETAMINOPHEN 5 MG-325 MG TAB: 5-325 mg | ORAL | Qty: 1

## 2018-02-24 MED FILL — PREDNISONE 20 MG TAB: 20 mg | ORAL | Qty: 1

## 2018-02-24 MED FILL — PANTOPRAZOLE 40 MG TAB, DELAYED RELEASE: 40 mg | ORAL | Qty: 1

## 2018-02-24 NOTE — Progress Notes (Signed)
Problem: Diabetes Self-Management  Goal: *Disease process and treatment process  Description  Define diabetes and identify own type of diabetes; list 3 options for treating diabetes.  Outcome: Resolved/Met  Goal: *Incorporating nutritional management into lifestyle  Description  Describe effect of type, amount and timing of food on blood glucose; list 3 methods for planning meals.  Outcome: Resolved/Met  Goal: *Incorporating physical activity into lifestyle  Description  State effect of exercise on blood glucose levels.  Outcome: Resolved/Met  Goal: *Developing strategies to promote health/change behavior  Description  Define the ABC's of diabetes; identify appropriate screenings, schedule and personal plan for screenings.  Outcome: Resolved/Met  Goal: *Using medications safely  Description  State effect of diabetes medications on diabetes; name diabetes medication taking, action and side effects.  Outcome: Resolved/Met  Goal: *Monitoring blood glucose, interpreting and using results  Description  Identify recommended blood glucose targets  and personal targets.  Outcome: Resolved/Met  Goal: *Prevention, detection, treatment of acute complications  Description  List symptoms of hyper- and hypoglycemia; describe how to treat low blood sugar and actions for lowering  high blood glucose level.  Outcome: Resolved/Met  Goal: *Prevention, detection and treatment of chronic complications  Description  Define the natural course of diabetes and describe the relationship of blood glucose levels to long term complications of diabetes.  Outcome: Resolved/Met  Goal: *Developing strategies to address psychosocial issues  Description  Describe feelings about living with diabetes; identify support needed and support network  Outcome: Resolved/Met  Goal: *Insulin pump training  Outcome: Resolved/Met  Goal: *Sick day guidelines  Outcome: Resolved/Met  Goal: *Patient Specific Goal (EDIT GOAL, INSERT TEXT)  Outcome: Resolved/Met      Problem: Patient Education: Go to Patient Education Activity  Goal: Patient/Family Education  Outcome: Resolved/Met     Problem: Falls - Risk of  Goal: *Absence of Falls  Description  Document Schmid Fall Risk and appropriate interventions in the flowsheet.  Outcome: Resolved/Met  Note:   Fall Risk Interventions:  Mobility Interventions: Patient to call before getting OOB         Medication Interventions: Patient to call before getting OOB, Teach patient to arise slowly, Bed/chair exit alarm    Elimination Interventions: Bed/chair exit alarm, Call light in reach              Problem: Patient Education: Go to Patient Education Activity  Goal: Patient/Family Education  Outcome: Resolved/Met     Problem: Falls - Risk of  Goal: *Absence of Falls  Description  Document Schmid Fall Risk and appropriate interventions in the flowsheet.  Outcome: Resolved/Met  Note:   Fall Risk Interventions:  Mobility Interventions: Patient to call before getting OOB         Medication Interventions: Patient to call before getting OOB, Teach patient to arise slowly, Bed/chair exit alarm    Elimination Interventions: Bed/chair exit alarm, Call light in reach              Problem: Patient Education: Go to Patient Education Activity  Goal: Patient/Family Education  Outcome: Resolved/Met     Problem: Gas Exchange - Impaired  Goal: *Absence of hypoxia  Outcome: Resolved/Met     Problem: Breathing Pattern - Ineffective  Goal: *Absence of hypoxia  Outcome: Resolved/Met  Goal: *Use of effective breathing techniques  Outcome: Resolved/Met     Problem: Patient Education: Go to Patient Education Activity  Goal: Patient/Family Education  Outcome: Resolved/Met

## 2018-02-24 NOTE — Other (Addendum)
----------  DocumentID: [id number]------------------------------------------------              [facility]                       Patient Education Report         Name: [name] [name], Maydelin                  Date: 02/20/2018    MRN: [id number]                    Time: 6:07:21 PM         Patient ordered video: 'Patient Safety: Stay Safe While you are in the Hospital'    from [id number] via phone number: [phone] at 6:07:21 PM    Description: This program outlines some of the precautions patients can take to ensure a speedy recovery without extra complications. The video emphasizes the importance of communicating with the healthcare team.    ----------DocumentID: [id number]------------------------------------------------                       [facility]          Patient Education Report - Discharge Summary        Date: 02/24/2018   Time: 1:07:21 PM   Name: [name] [name], [name]   MRN: [id number]      Account Number: [id number]      Education History:        Patient ordered video: 'Patient Safety: Stay Safe While you are in the Hospital' from [id number] on 02/20/2018 06:07:21 PM

## 2018-02-24 NOTE — Progress Notes (Addendum)
Discharge Plan:   Home with family and HH care. Dc instructions per MD and dc RN to pt. See epic for follow up care and appointments as noted.    Discharge Date:     02/24/2018         Home Health Needed:     Yes  PT OT [facility]    Home Health Agency:    [facility]    FOC given : yes    Confirmed start of care with the home health agency and spoke with:      Sent email to [facility] and HH rep and Amy Reynolds and Terri Craig      Transportation: Family/       Follow-Up       Follow-up With  Details  Why  Contact Info   [facility]      [address] [place]  [place] [address]  [phone]   Brock, Monique, PA-C      [address]  [address]  [place] [address]  [phone]        Spoke with Terri with HH aware of dc.

## 2018-02-24 NOTE — Discharge Summary (Signed)
DISCHARGE SUMMARY     Patient Name: [name] [name]  Medical Record Number: [id number]  Date of Birth: Mar 16, 1951  Discharge Provider: Clearnce Sorrel, MD  Primary Care Provider: Wallene Huh, PA-C    Admit date: 02/18/2018  Discharge Date:  03/16/18 Time: 10:23 AM  Discharge Disposition: Home  Code Status: Full Code    Follow-up appointments:     Follow-up Information     Follow up With Specialties Details [place]   [address], [place]    Wallene Huh, PA-C Physician Assistant  on Monday 02/26/2018 , for follow-up appointment with your PCP in 1 week  [address]  [place] [address]  [phone]      Raynelle Jan, MD Internal Medicine Call on Monday 02/26/2018 , for follow-up appointment with your Pulmonologist in 2 weeks [place] [address]  [phone]      Elmer Bales, MD General Surgery Call on Monday 02/26/2018 , for follow-up appointment with your Surgeon in 2-3 weeks [address]  [place] [address]  [phone]             Follow-up recommendations:   Follow-up with primary care physician, pulmonology and Dr. Eulas Post general surgery.  Discharge diagnosis:  1. Primary diagnosis           COPD exacerbation [facility])                    Hospital Problems as of 03-16-18 Date Reviewed: Mar 16, 2018          Codes Class Noted - Resolved POA    * (Principal) COPD exacerbation ([place]) ICD-10-CM: J44.1  ICD-9-CM: 491.21  11/14/2017 - Present Unknown                                            Past Medical History:   Diagnosis Date   ??? Arthritis    ??? 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 COPD (chronic obstructive pulmonary disease) ([place])    ??? DM2 (diabetes mellitus, type 2) ([place])    ??? GERD (gastroesophageal reflux disease)    ??? Hepatitis C     HEP C   ??? HTN (hypertension)    ??? Lung nodule, multiple      (CT 10/2016) Small left upper lobe and 1.7 x 1.6 cm left lower lobe nodules not significantly changed from 07/23/2016 and 03/22/2016   ??? Marijuana use    ??? PTSD (post-traumatic stress disorder)     lived through the [place] in 1993   ??? Thoracic ascending aortic aneurysm ([place])     4.4cm noted on CT Chest (10/2016)   ??? Thyroid nodule     2.5 cm stable right thyroid nodule (CT 10/2016)                                                        Discharge Medications:                 Current Discharge Medication  List      START taking these medications    Details   pantoprazole (PROTONIX) 40 mg tablet Take 1 Tab by mouth Daily (before breakfast).  Qty: 30 Tab, Refills: 0      senna-docusate (PERICOLACE) 8.6-50 mg per tablet Take 1 Tab by mouth daily.  Qty: 30 Tab, Refills: 0      celecoxib (CELEBREX) 200 mg capsule Take 1 Cap by mouth two (2) times a day for 30 days.  Qty: 60 Cap, Refills: 0         CONTINUE these medications which have CHANGED    Details   polyethylene glycol (MIRALAX) 17 gram packet Take 1 Packet by mouth daily.  Qty: 30 Packet, Refills: 1      cloNIDine HCl (CATAPRES) 0.1 mg tablet Take 3 Tabs by mouth two (2) times a day. Take bid  Qty: 60 Tab, Refills: 0      losartan (COZAAR) 25 mg tablet Take 1 Tab by mouth daily.  Qty: 30 Tab, Refills: 0         CONTINUE these medications which have NOT CHANGED    Details   gabapentin (NEURONTIN) 100 mg capsule Take 100 mg by mouth three (3) times daily.      magnesium oxide (MAG-OX) 400 mg tablet Take 400 mg by mouth daily.      montelukast (SINGULAIR) 10 mg tablet Take 10 mg by mouth daily.      QUEtiapine SR (SEROQUEL XR) 150 mg sr tablet Take 150 mg by mouth nightly.      insulin glargine (LANTUS SOLOSTAR U-100 INSULIN) 100 unit/mL (3 mL) inpn 32 Units by SubCUTAneous route nightly.  Qty: 15 mL, Refills: 2    Comments: Appt required for additional refills  Associated Diagnoses: Type 2 diabetes mellitus with diabetic neuropathy,  without long-term current use of insulin (HCC)      metFORMIN ER (GLUCOPHAGE XR) 500 mg tablet Take 1 Tab by mouth two (2) times a day.  Qty: 60 Tab, Refills: 2      Insulin Needles, Disposable, (NANO PEN NEEDLE) 32 gauge x 5/32" ndle To use with Lantus pen  Qty: 100 Pen Needle, Refills: 0      dilTIAZem CD (CARDIZEM CD) 180 mg ER capsule Take 1 Cap by mouth daily.  Qty: 90 Cap, Refills: 1      spironolactone (ALDACTONE) 100 mg tablet TAKE 1 TABLET BY MOUTH DAILY  Qty: 90 Tab, Refills: 0    Associated Diagnoses: Severe hypertension      ondansetron (ZOFRAN ODT) 4 mg disintegrating tablet Take 1 Tab by mouth every eight (8) hours as needed for Nausea. Indications: nausea  Qty: 12 Tab, Refills: 0      albuterol (ACCUNEB) 1.25 mg/3 mL nebu 3 mL by Nebulization route every six (6) hours as needed (SOB, wheezing). Indications: Asthma Attack  Qty: 100 Each, Refills: 0      nabumetone (RELAFEN) 750 mg tablet Take 750 mg by mouth two (2) times a day.      albuterol-ipratropium (DUO-NEB) 2.5 mg-0.5 mg/3 ml nebu 3 mL by Nebulization route every six (6) hours as needed.      Blood Glucose Control High&Low (ACCU-CHEK AVIVA CONTROL SOLN) soln Check blood sugar daily  Qty: 1 Bottle, Refills: 0      Blood-Glucose Meter (ACCU-CHEK AVIVA PLUS METER) misc Check blood sugar daily  Qty: 1 Each, Refills: 0      !! glucose blood VI test strips (ACCU-CHEK AVIVA PLUS TEST STRP) strip  Check blood sugar daily  Qty: 100 Strip, Refills: 5      alcohol swabs (BD SINGLE USE SWABS REGULAR) padm Check blood sugar daily  Qty: 100 Pad, Refills: 5      !! lancets (ACCU-CHEK SOFTCLIX LANCETS) misc Check blood sugar daily  Qty: 100 Each, Refills: 5      tiotropium bromide (SPIRIVA RESPIMAT) 2.5 mcg/actuation inhaler Take 1 Puff by inhalation daily.  Qty: 1 Inhaler, Refills: 0    Associated Diagnoses: COPD with acute exacerbation (HCC)      fluticasone propion-salmeterol (ADVAIR DISKUS) 100-50 mcg/dose diskus  inhaler Take 1 Puff by inhalation two (2) times a day.  Qty: 1 Inhaler, Refills: 0      traZODone (DESYREL) 50 mg tablet Take 1 Tab by mouth nightly as needed for Sleep.  Qty: 20 Tab, Refills: 0      rosuvastatin (CRESTOR) 5 mg tablet TAKE 1 TABLET BY MOUTH NIGHTLY  Qty: 90 Tab, Refills: 1    Associated Diagnoses: Type 2 diabetes mellitus with diabetic neuropathy, without long-term current use of insulin (HCC)      triamcinolone (NASACORT) 55 mcg nasal inhaler 2 Sprays by Both Nostrils route daily.  Qty: 1 Bottle, Refills: 0    Associated Diagnoses: Mild intermittent asthma, unspecified whether complicated; Chronic rhinitis      Blood-Glucose Meter monitoring kit Check sugars 2 times per day, fasting and 2 hours after dinner  Qty: 1 Kit, Refills: 0    Associated Diagnoses: Diabetes mellitus, new onset ([place])      !! Lancets misc Check sugars twice daily  Qty: 200 Each, Refills: 2    Associated Diagnoses: Diabetes mellitus, new onset ([place])      !! glucose blood VI test strips (ASCENSIA AUTODISC VI, ONE TOUCH ULTRA TEST VI) strip Check sugars twice daily  Qty: 100 Strip, Refills: 5    Associated Diagnoses: Diabetes mellitus, new onset ([place])       !! - Potential duplicate medications found. Please discuss with provider.          Discharge Diet:            Diet: Cardiac Diet and Diabetic Diet    Consultants: General Surgery    Procedures:   * No surgery found *        Most Recent BMP and CBC:    No results for input(s): BUN, NA, POTASSIUM, CHLORIDE, CO2, ALT in the last 72 hours.    No lab exists for component: GLUCOSE, CALCIUM, CREAT, AST, BILIRUBIN  No results for input(s): WBC, RBC, HCT, MCV, MCH, MCHC, RDW, HCTEXT, HCTEXT in the last 72 hours.    No lab exists for component: HEMOGLOBIN, PLATELET    Imaging:    Xr Chest Sngl V    Result Date: 02/18/2018  INDICATION: Respiratory failure   EXAMINATION: XR CHEST SNGL V COMPARISON: 02/04/2018 FINDINGS: The study shows a normal sized heart.. Stable 17 mm  nodule at the left lower lobe.. Bullous emphysema.     IMPRESSION: 1. Stable 17 mm nodule at the left lower lobe. 2. Bullous emphysema.     Xr Chest Pa Lat    Result Date: 02/04/2018  INDICATION: shortness of breath   EXAMINATION: XR CHEST PA LAT COMPARISON: 01/15/2018 FINDINGS: The study shows a normal sized heart.. Stable 19 mm nodule at the left lower lobe.. Left lingular scarring.     IMPRESSION: Left basilar scarring and stable 19 mm nodule at the left lower lobe.     Cta Chest  W Or W Wo Cont    Result Date: 02/18/2018  EXAM: CTA CHEST W OR W WO CONT INDICATION: Respiratory failure, with elevated d-dimer..    TECHNIQUE: CTA scan of the chest with IV contrast including with 3D MIP Images including coronal and sagittal images. DICOM format image data is available to non-affiliated external healthcare facilities or entities on a secure, media free, reciprocally searchable basis with patient authorization for 12 months following the date of the study. COMPARISON: No relevant prior studies available. FINDINGS: Pulmonary arteries: Unremarkable. No pulmonary embolism. Aorta: No acute findings. No thoracic aortic aneurysm or dissection. Lungs: Stable 19 mm left lower lobe lung nodule. Bullous emphysema. Lymph nodes: Unremarkable. No enlarged lymph nodes. Esophagus: Normal Pleural spaces: Unremarkable. No significant effusion. No pneumothorax. Heart: Unremarkable. No cardiomegaly. No significant pericardial effusion. No evidence of right ventricular dysfunction. Thyroid: Normal Chest wall: No abnormal findings. Bones: No fractures identified.     IMPRESSION: 1. No evidence of pulmonary embolism. 2. Stable 19 mm left lower lobe lung nodule. 3. Bullous emphysema.     Ct Abd Pelv W Cont    Result Date: 02/22/2018  EXAM: CT ABD PELV W CONT INDICATION: Severe abdominal pain    TECHNIQUE: Axial CT scan of the abdomen and pelvis with   IV contrast    including coronal and sagittal reconstructions.  DICOM format image data is  available to non-affiliated external healthcare facilities or entities on a secure, media free, reciprocally searchable basis with patient authorization for 12 months following the date of the study. COMPARISON: 02/18/2018 FINDINGS: Lower thorax: Bullous emphysema. ABDOMEN: Liver: Unremarkable. No mass. Gallbladder and bile ducts: Unremarkable. No calcified stones. No ductal dilatation. Pancreas: Unremarkable. No ductal dilatation. No mass. Spleen: Unchanged multifocal lesions. Adrenals: Unremarkable. No mass. Kidneys and ureters: Unremarkable. No hydronephrosis. No solid mass. Appendix: No findings to suggest appendicitis. Stomach and bowel: Stomach unremarkable. No obstruction. No mucosal thickening. No inflammatory changes. Multiple sigmoid diverticula without diverticulitis. Constipation. Peritoneum: Unremarkable. No significant fluid collection. No free air. Lymph nodes: Unremarkable. No enlarged lymph nodes. Vasculature: Unremarkable. No aortic aneurysm. Bones: No acute fracture. Abdominal wall: Fat-containing ventral hernia. Increased density of the fat consistent with inflammatory/ischemic changes. There is less fluid within the hernial sac. PELVIS: Bladder: Unremarkable. No mass. Reproductive: Hysterectomy.     IMPRESSION: 1. Fat containing ventral hernia with changes suggesting fat inflammation/ischemia. 2. Multiple sigmoid diverticula without diverticulitis. 3. Constipation.     Ct Abd Pelv W Cont    Result Date: 02/18/2018  EXAM: CT ABD PELV W CONT INDICATION: Worsening abdominal pain, with history of incarcerated ventral hernia.    TECHNIQUE: Axial CT scan of the abdomen and pelvis with   IV contrast    including coronal and sagittal reconstructions.  DICOM format image data is available to non-affiliated external healthcare facilities or entities on a secure, media free, reciprocally searchable basis with patient authorization for 12 months  following the date of the study. COMPARISON: 01/29/2018. FINDINGS: ABDOMEN: Liver: Unremarkable. No mass. Gallbladder and bile ducts: Unremarkable. No calcified stones. No ductal dilatation. Pancreas: Unremarkable. No ductal dilatation. No mass. Spleen: Unchanged splenic lesions. Adrenals: Unremarkable. No mass. Kidneys and ureters: Unremarkable. No hydronephrosis. No solid mass. Appendix: No findings to suggest appendicitis. Stomach and bowel: Stomach unremarkable. No obstruction. No mucosal thickening. No inflammatory changes. Multiple sigmoid diverticula without diverticulitis. Peritoneum: Unremarkable. No significant fluid collection. No free air. Lymph nodes: Unremarkable. No enlarged lymph nodes. Vasculature: Unremarkable. No aortic aneurysm. Bones: No acute fracture. Abdominal  wall: Unchanged fat and fluid containing ventral hernia. PELVIS: Bladder: Unremarkable. No mass. Reproductive: Unremarkable as visualized.     IMPRESSION: 1. Multiple sigmoid diverticula without diverticulitis. 2. Unchanged fat and fluid containing ventral hernia. 3. Unchanged splenic lesions.     Ct Abd Pelv W Cont    Result Date: 01/29/2018  DICOM format image data is available to non-affiliated external healthcare facilities or entities on a secure, media free, reciprocally searchable basis with patient authorization for 12 months following the date of the study. Clinical history: Ventral hernia, abdominal pain EXAMINATION: CT scan of the abdomen and pelvis 01/29/2018. 5 mm spiral scanning is performed from the costophrenic angles to the symphysis pubis. Coronal and sagittal reconstruction imaging has been obtained. FINDINGS: There are bilateral bullae. Osteopenia and degenerative changes of the spine. Right hip prosthesis. There are splenic hypodensities in size from study of 08/27/2016. Spleen, adrenal glands and bladder are unremarkable. Uterus is absent. Subcentimeter renal hypodensities too small to characterize. 7 mm  nonobstructing right nephrolithiasis. There are colonic diverticuli. Colon, terminal ileum, appendix, stomach and small bowel loops are within normal limits. Abdominal aorta is within normal limits. Small to borderline lymph nodes at the porta hepatis. Fat and fluid containing anterior abdominal wall hernia with surrounding inflammation suggesting incarceration.     IMPRESSION: 1. Incarcerated fat containing anterior abdominal wall hernia. 2. 7 mm nonobstructing right nephrolithiasis. 3. Diverticulosis. 4. Hysterectomy. 5. Small splenic lesions, increased in size from 08/27/2016.       Echo Results  (Last 48 hours)    None          History of presenting illness:( Per admitting M.D.)  [name] [name] is a 67 y.o. year old female extensive past medical history including COPD, hypertension, insulin dependent diabetes, hyperlipidemia, chronic pain and peripheral neuropathy unknown anterior abdominal wall incarcerated fat hernia presents from home today accompanied by her husband with chief complaint of shortness of breath.  Patient was recently hospitalized here from August 25 August 29 for mild COPD exacerbation and abdominal pain secondary to her known abdominal wall hernia.  She was recommended to have outpatient surgery evaluation once off of all steroids to address the hernia.  She was also treated for mild COPD exacerbation during that hospitalization.  She was discharged with 5 additional days of steroids including prednisone, she states she completed them and felt better for approximately 2 days but for the last 2 days has noticed increased work of breathing, shortness of breath with wheezing.  No productive cough or chest pain associated.    ??  She states that she was given 30 Percocets on the day of discharge and ran out 3 to 4 days ago.  She states she was taking approximately 6 of them a day.  They were helping with the pain, she was unable to see surgery in  the outpatient setting yet but has an appointment later this week.    Hospital course:   COPD with acute exacerbation  Acute respiratory failure ruled out  Lactic acidosis, sepsis ruled out  Anterior abdominal wall .  No incarceration  Diabetes  mellitus type 2 uncontrolled with hyperglycemia  Hypertension  Hyperlipidemia  Peripheral neuropathy  GERD  Chronic pain syndrome  History hepatitis C  Constipation  ??  Treatment plan  Patient admitted to hospital with severe COPD except patient was placed on BiPAP.  Currently off BiPAP.  Shortness of breath is improving.   Received 5 days of steroids.  O2 sat is normal.  Recommended outpatient follow-up with primary pulmonologist.  Abdominal CT scan reviewed and shows fat-containing ventral abdominal hernia with some fat inflammation and  Ischemia. No  Bowel present in the hernia.  Findings discussed with Dr. Eulas Post general surgeon and he recommends the patient does not need any intervention.  She is stable for discharge and follow-up with Dr. Eulas Post as outpatient.  She will need elective repair when respiratory status improved..  Constipation better.  Blood sugar improving.  Clonidine decreased to 0.1 mg twice daily and losartan decreased to 25 mg.  Patient is feeling better.  She is being discharged home with outpatient follow-up.  Advised to return to hospital for any new or worsening symptoms.    Vitals on Discharge:  Visit Vitals  BP 102/64 (BP 1 Location: Right arm, BP Patient Position: Supine)   Pulse 94   Temp 97.1 ??F (36.2 ??C)   Resp 18   Ht _0  (1.727 m)   Wt 84.4 kg (186 lb 1.1 oz)   SpO2 99%   Breastfeeding? No   BMI 28.29 kg/m??       Physical Exam:  Constitutional:??Awake and alert, NAD  HENT:??atraumatic, normocephalic, oropharynx clear ??  Eyes:??????conjunctiva normal  Neck:??????supple and trachea normal  Cardiovascular:?? Regular rate and rhythm, heart sounds normal, intact distal pulses  Pulmonary/Chest Wall: breath sounds normal and effort normal   Abdominal:????????????appearance normal, soft, non-tender upon palpation,bowel sounds normal.  Neurological:??????awake, alert and oriented, CN intact, no focal neuro deficits, moves all extremities equally .  Extremities:???? No clubbing or cyanosis. Pedal pulses 2+ b/l.       Discharge condition:  improved    Discharge activity and restrictions: Activity as tolerated    Time spent with discharging patient:   Discharge plan discussed with pt. All questions answered. Need for compliance with medicine and follow up emphasized. Pt verbalized the understanding of care. Time spent 35 minutes.      Clearnce Sorrel, MD  02/24/2018  10:23 AM    Copies: Wallene Huh, PA-C

## 2018-02-24 NOTE — Progress Notes (Signed)
Discharge Plan:   [facility] instructions per MD and dc RN to pt. See epic for follow up care and appointments as noted.    Discharge Date:     02/24/2018         [facility] Health Needed:     Yes  [facility]    [facility]    [facility]    Confirmed start of care with the home health agency and spoke with:      [facility] and Amy Thad Rangereynolds and Araceli Boucheerri Craig      Transportation: Family/       Follow-Up       Follow-up With  Details  Why  Contact Info   [facility]      [address] [place]  [place] [id number]  [phone]   Lennox GrumblesBrock, Monique, PA-C      [address]  [address]  [place] [id number]  [phone]        [facility] of [facility].

## 2018-02-24 NOTE — Progress Notes (Signed)
Problem: Diabetes Self-Management  Goal: *Disease process and treatment process  Description  Define diabetes and identify own type of diabetes; list 3 options for treating diabetes.  Outcome: Resolved/Met  Goal: *Incorporating nutritional management into lifestyle  Description  Describe effect of type, amount and timing of food on blood glucose; list 3 methods for planning meals.  Outcome: Resolved/Met  Goal: *Incorporating physical activity into lifestyle  Description  State effect of exercise on blood glucose levels.  Outcome: Resolved/Met  Goal: *Developing strategies to promote health/change behavior  Description  Define the ABC's of diabetes; identify appropriate screenings, schedule and personal plan for screenings.  Outcome: Resolved/Met  Goal: *Using medications safely  Description  State effect of diabetes medications on diabetes; name diabetes medication taking, action and side effects.  Outcome: Resolved/Met  Goal: *Monitoring blood glucose, interpreting and using results  Description  Identify recommended blood glucose targets  and personal targets.  Outcome: Resolved/Met  Goal: *Prevention, detection, treatment of acute complications  Description  List symptoms of hyper- and hypoglycemia; describe how to treat low blood sugar and actions for lowering  high blood glucose level.  Outcome: Resolved/Met  Goal: *Prevention, detection and treatment of chronic complications  Description  Define the natural course of diabetes and describe the relationship of blood glucose levels to long term complications of diabetes.  Outcome: Resolved/Met  Goal: *Developing strategies to address psychosocial issues  Description  Describe feelings about living with diabetes; identify support needed and support network  Outcome: Resolved/Met  Goal: *Insulin pump training  Outcome: Resolved/Met  Goal: *Sick day guidelines  Outcome: Resolved/Met  Goal: *Patient Specific Goal (EDIT GOAL, INSERT TEXT)  Outcome: Resolved/Met      Problem: Patient Education: Go to Patient Education Activity  Goal: Patient/Family Education  Outcome: Resolved/Met     Problem: Falls - Risk of  Goal: *Absence of Falls  Description  Document Schmid Fall Risk and appropriate interventions in the flowsheet.  Outcome: Resolved/Met  Note:   Fall Risk Interventions:  Mobility Interventions: Patient to call before getting OOB         Medication Interventions: Patient to call before getting OOB, Teach patient to arise slowly, Bed/chair exit alarm    Elimination Interventions: Bed/chair exit alarm, Call light in reach              Problem: Patient Education: Go to Patient Education Activity  Goal: Patient/Family Education  Outcome: Resolved/Met     Problem: Falls - Risk of  Goal: *Absence of Falls  Description  Document Patrcia Dolly Fall Risk and appropriate interventions in the flowsheet.  Outcome: Resolved/Met  Note:   Fall Risk Interventions:  Mobility Interventions: Patient to call before getting OOB         Medication Interventions: Patient to call before getting OOB, Teach patient to arise slowly, Bed/chair exit alarm    Elimination Interventions: Bed/chair exit alarm, Call light in reach              Problem: Patient Education: Go to Patient Education Activity  Goal: Patient/Family Education  Outcome: Resolved/Met     Problem: Gas Exchange - Impaired  Goal: *Absence of hypoxia  Outcome: Resolved/Met     Problem: Breathing Pattern - Ineffective  Goal: *Absence of hypoxia  Outcome: Resolved/Met  Goal: *Use of effective breathing techniques  Outcome: Resolved/Met     Problem: Patient Education: Go to Patient Education Activity  Goal: Patient/Family Education  Outcome: Resolved/Met

## 2018-02-24 NOTE — Discharge Summary (Signed)
DISCHARGE SUMMARY     Patient Name: [name] [name]  Medical Record Number: [id number]  Date of Birth: 10/12/1950  Discharge Provider: Clearnce Sorrel, MD  Primary Care Provider: Wallene Huh, PA-C    Admit date: 02/18/2018  Discharge Date:  February 25, 2018 Time: 10:23 AM  Discharge Disposition: Home  Code Status: Full Code    Follow-up appointments:     Follow-up Information     Follow up With Specialties Details [facility]   [address] [place]    Wallene Huh, PA-C Physician Assistant  on Monday 02/26/2018 , for follow-up appointment with your PCP in 1 week  [address]  [place] [address]  [phone]      Raynelle Jan, MD Internal Medicine Call on Monday 02/26/2018 , for follow-up appointment with your Pulmonologist in 2 weeks [place] [address]  [phone]      Elmer Bales, MD General Surgery Call on Monday 02/26/2018 , for follow-up appointment with your Surgeon in 2-3 weeks [address]  [place] [address]  [phone]             Follow-up recommendations:   Follow-up with primary care physician, pulmonology and Dr. Eulas Post general surgery.  Discharge diagnosis:  1. Primary diagnosis           COPD exacerbation [facility])                    Hospital Problems as of 2018-02-25 Date Reviewed: 2018/02/25          Codes Class Noted - Resolved POA    * (Principal) COPD exacerbation ([place]) ICD-10-CM: J44.1  ICD-9-CM: 491.21  11/14/2017 - Present Unknown                                            Past Medical History:   Diagnosis Date   ??? Arthritis    ??? 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 COPD (chronic obstructive pulmonary disease) ([place])    ??? DM2 (diabetes mellitus, type 2) ([place])    ??? GERD (gastroesophageal reflux disease)    ??? Hepatitis C     HEP C   ??? HTN (hypertension)    ??? Lung nodule, multiple     (CT 10/2016) Small left  upper lobe and 1.7 x 1.6 cm left lower lobe nodules not significantly changed from 07/23/2016 and 03/22/2016   ??? Marijuana use    ??? PTSD (post-traumatic stress disorder)     lived through the [place] in 1993   ??? Thoracic ascending aortic aneurysm ([place])     4.4cm noted on CT Chest (10/2016)   ??? Thyroid nodule     2.5 cm stable right thyroid nodule (CT 10/2016)                                                        Discharge Medications:                 Current Discharge Medication  List      START taking these medications    Details   pantoprazole (PROTONIX) 40 mg tablet Take 1 Tab by mouth Daily (before breakfast).  Qty: 30 Tab, Refills: 0      senna-docusate (PERICOLACE) 8.6-50 mg per tablet Take 1 Tab by mouth daily.  Qty: 30 Tab, Refills: 0      celecoxib (CELEBREX) 200 mg capsule Take 1 Cap by mouth two (2) times a day for 30 days.  Qty: 60 Cap, Refills: 0         CONTINUE these medications which have CHANGED    Details   polyethylene glycol (MIRALAX) 17 gram packet Take 1 Packet by mouth daily.  Qty: 30 Packet, Refills: 1      cloNIDine HCl (CATAPRES) 0.1 mg tablet Take 3 Tabs by mouth two (2) times a day. Take bid  Qty: 60 Tab, Refills: 0      losartan (COZAAR) 25 mg tablet Take 1 Tab by mouth daily.  Qty: 30 Tab, Refills: 0         CONTINUE these medications which have NOT CHANGED    Details   gabapentin (NEURONTIN) 100 mg capsule Take 100 mg by mouth three (3) times daily.      magnesium oxide (MAG-OX) 400 mg tablet Take 400 mg by mouth daily.      montelukast (SINGULAIR) 10 mg tablet Take 10 mg by mouth daily.      QUEtiapine SR (SEROQUEL XR) 150 mg sr tablet Take 150 mg by mouth nightly.      insulin glargine (LANTUS SOLOSTAR U-100 INSULIN) 100 unit/mL (3 mL) inpn 32 Units by SubCUTAneous route nightly.  Qty: 15 mL, Refills: 2    Comments: Appt required for additional refills  Associated Diagnoses: Type 2 diabetes mellitus with diabetic neuropathy, without long-term current use of insulin  (HCC)      metFORMIN ER (GLUCOPHAGE XR) 500 mg tablet Take 1 Tab by mouth two (2) times a day.  Qty: 60 Tab, Refills: 2      Insulin Needles, Disposable, (NANO PEN NEEDLE) 32 gauge x 5/32" ndle To use with Lantus pen  Qty: 100 Pen Needle, Refills: 0      dilTIAZem CD (CARDIZEM CD) 180 mg ER capsule Take 1 Cap by mouth daily.  Qty: 90 Cap, Refills: 1      spironolactone (ALDACTONE) 100 mg tablet TAKE 1 TABLET BY MOUTH DAILY  Qty: 90 Tab, Refills: 0    Associated Diagnoses: Severe hypertension      ondansetron (ZOFRAN ODT) 4 mg disintegrating tablet Take 1 Tab by mouth every eight (8) hours as needed for Nausea. Indications: nausea  Qty: 12 Tab, Refills: 0      albuterol (ACCUNEB) 1.25 mg/3 mL nebu 3 mL by Nebulization route every six (6) hours as needed (SOB, wheezing). Indications: Asthma Attack  Qty: 100 Each, Refills: 0      nabumetone (RELAFEN) 750 mg tablet Take 750 mg by mouth two (2) times a day.      albuterol-ipratropium (DUO-NEB) 2.5 mg-0.5 mg/3 ml nebu 3 mL by Nebulization route every six (6) hours as needed.      Blood Glucose Control High&Low (ACCU-CHEK AVIVA CONTROL SOLN) soln Check blood sugar daily  Qty: 1 Bottle, Refills: 0      Blood-Glucose Meter (ACCU-CHEK AVIVA PLUS METER) misc Check blood sugar daily  Qty: 1 Each, Refills: 0      !! glucose blood VI test strips (ACCU-CHEK AVIVA PLUS TEST STRP) strip  Check blood sugar daily  Qty: 100 Strip, Refills: 5      alcohol swabs (BD SINGLE USE SWABS REGULAR) padm Check blood sugar daily  Qty: 100 Pad, Refills: 5      !! lancets (ACCU-CHEK SOFTCLIX LANCETS) misc Check blood sugar daily  Qty: 100 Each, Refills: 5      tiotropium bromide (SPIRIVA RESPIMAT) 2.5 mcg/actuation inhaler Take 1 Puff by inhalation daily.  Qty: 1 Inhaler, Refills: 0    Associated Diagnoses: COPD with acute exacerbation (HCC)      fluticasone propion-salmeterol (ADVAIR DISKUS) 100-50 mcg/dose diskus inhaler Take 1 Puff by inhalation two (2) times a day.  Qty: 1 Inhaler, Refills: 0       traZODone (DESYREL) 50 mg tablet Take 1 Tab by mouth nightly as needed for Sleep.  Qty: 20 Tab, Refills: 0      rosuvastatin (CRESTOR) 5 mg tablet TAKE 1 TABLET BY MOUTH NIGHTLY  Qty: 90 Tab, Refills: 1    Associated Diagnoses: Type 2 diabetes mellitus with diabetic neuropathy, without long-term current use of insulin (HCC)      triamcinolone (NASACORT) 55 mcg nasal inhaler 2 Sprays by Both Nostrils route daily.  Qty: 1 Bottle, Refills: 0    Associated Diagnoses: Mild intermittent asthma, unspecified whether complicated; Chronic rhinitis      Blood-Glucose Meter monitoring kit Check sugars 2 times per day, fasting and 2 hours after dinner  Qty: 1 Kit, Refills: 0    Associated Diagnoses: Diabetes mellitus, new onset ([place])      !! Lancets misc Check sugars twice daily  Qty: 200 Each, Refills: 2    Associated Diagnoses: Diabetes mellitus, new onset ([place])      !! glucose blood VI test strips (ASCENSIA AUTODISC VI, ONE TOUCH ULTRA TEST VI) strip Check sugars twice daily  Qty: 100 Strip, Refills: 5    Associated Diagnoses: Diabetes mellitus, new onset ([place])       !! - Potential duplicate medications found. Please discuss with provider.          Discharge Diet:            Diet: Cardiac Diet and Diabetic Diet    Consultants: General Surgery    Procedures:   * No surgery found *        Most Recent BMP and CBC:    No results for input(s): BUN, NA, POTASSIUM, CHLORIDE, CO2, ALT in the last 72 hours.    No lab exists for component: GLUCOSE, CALCIUM, CREAT, AST, BILIRUBIN  No results for input(s): WBC, RBC, HCT, MCV, MCH, MCHC, RDW, HCTEXT, HCTEXT in the last 72 hours.    No lab exists for component: HEMOGLOBIN, PLATELET    Imaging:    Xr Chest Sngl V    Result Date: 02/18/2018  INDICATION: Respiratory failure   EXAMINATION: XR CHEST SNGL V COMPARISON: 02/04/2018 FINDINGS: The study shows a normal sized heart.. Stable 17 mm nodule at the left lower lobe.. Bullous emphysema.     IMPRESSION: 1. Stable 17 mm nodule at the left lower  lobe. 2. Bullous emphysema.     Xr Chest Pa Lat    Result Date: 02/04/2018  INDICATION: shortness of breath   EXAMINATION: XR CHEST PA LAT COMPARISON: 01/15/2018 FINDINGS: The study shows a normal sized heart.. Stable 19 mm nodule at the left lower lobe.. Left lingular scarring.     IMPRESSION: Left basilar scarring and stable 19 mm nodule at the left lower lobe.     Cta Chest  W Or W Wo Cont    Result Date: 02/18/2018  EXAM: CTA CHEST W OR W WO CONT INDICATION: Respiratory failure, with elevated d-dimer..    TECHNIQUE: CTA scan of the chest with IV contrast including with 3D MIP Images including coronal and sagittal images. DICOM format image data is available to non-affiliated external healthcare facilities or entities on a secure, media free, reciprocally searchable basis with patient authorization for 12 months following the date of the study. COMPARISON: No relevant prior studies available. FINDINGS: Pulmonary arteries: Unremarkable. No pulmonary embolism. Aorta: No acute findings. No thoracic aortic aneurysm or dissection. Lungs: Stable 19 mm left lower lobe lung nodule. Bullous emphysema. Lymph nodes: Unremarkable. No enlarged lymph nodes. Esophagus: Normal Pleural spaces: Unremarkable. No significant effusion. No pneumothorax. Heart: Unremarkable. No cardiomegaly. No significant pericardial effusion. No evidence of right ventricular dysfunction. Thyroid: Normal Chest wall: No abnormal findings. Bones: No fractures identified.     IMPRESSION: 1. No evidence of pulmonary embolism. 2. Stable 19 mm left lower lobe lung nodule. 3. Bullous emphysema.     Ct Abd Pelv W Cont    Result Date: 02/22/2018  EXAM: CT ABD PELV W CONT INDICATION: Severe abdominal pain    TECHNIQUE: Axial CT scan of the abdomen and pelvis with   IV contrast    including coronal and sagittal reconstructions.  DICOM format image data is available to non-affiliated external healthcare facilities or entities on a secure, media free, reciprocally  searchable basis with patient authorization for 12 months following the date of the study. COMPARISON: 02/18/2018 FINDINGS: Lower thorax: Bullous emphysema. ABDOMEN: Liver: Unremarkable. No mass. Gallbladder and bile ducts: Unremarkable. No calcified stones. No ductal dilatation. Pancreas: Unremarkable. No ductal dilatation. No mass. Spleen: Unchanged multifocal lesions. Adrenals: Unremarkable. No mass. Kidneys and ureters: Unremarkable. No hydronephrosis. No solid mass. Appendix: No findings to suggest appendicitis. Stomach and bowel: Stomach unremarkable. No obstruction. No mucosal thickening. No inflammatory changes. Multiple sigmoid diverticula without diverticulitis. Constipation. Peritoneum: Unremarkable. No significant fluid collection. No free air. Lymph nodes: Unremarkable. No enlarged lymph nodes. Vasculature: Unremarkable. No aortic aneurysm. Bones: No acute fracture. Abdominal wall: Fat-containing ventral hernia. Increased density of the fat consistent with inflammatory/ischemic changes. There is less fluid within the hernial sac. PELVIS: Bladder: Unremarkable. No mass. Reproductive: Hysterectomy.     IMPRESSION: 1. Fat containing ventral hernia with changes suggesting fat inflammation/ischemia. 2. Multiple sigmoid diverticula without diverticulitis. 3. Constipation.     Ct Abd Pelv W Cont    Result Date: 02/18/2018  EXAM: CT ABD PELV W CONT INDICATION: Worsening abdominal pain, with history of incarcerated ventral hernia.    TECHNIQUE: Axial CT scan of the abdomen and pelvis with   IV contrast    including coronal and sagittal reconstructions.  DICOM format image data is available to non-affiliated external healthcare facilities or entities on a secure, media free, reciprocally searchable basis with patient authorization for 12 months following the date of the study. COMPARISON: 01/29/2018. FINDINGS: ABDOMEN: Liver: Unremarkable. No mass. Gallbladder and bile ducts: Unremarkable. No calcified stones. No  ductal dilatation. Pancreas: Unremarkable. No ductal dilatation. No mass. Spleen: Unchanged splenic lesions. Adrenals: Unremarkable. No mass. Kidneys and ureters: Unremarkable. No hydronephrosis. No solid mass. Appendix: No findings to suggest appendicitis. Stomach and bowel: Stomach unremarkable. No obstruction. No mucosal thickening. No inflammatory changes. Multiple sigmoid diverticula without diverticulitis. Peritoneum: Unremarkable. No significant fluid collection. No free air. Lymph nodes: Unremarkable. No enlarged lymph nodes. Vasculature: Unremarkable. No aortic aneurysm. Bones: No acute fracture. Abdominal  wall: Unchanged fat and fluid containing ventral hernia. PELVIS: Bladder: Unremarkable. No mass. Reproductive: Unremarkable as visualized.     IMPRESSION: 1. Multiple sigmoid diverticula without diverticulitis. 2. Unchanged fat and fluid containing ventral hernia. 3. Unchanged splenic lesions.     Ct Abd Pelv W Cont    Result Date: 01/29/2018  DICOM format image data is available to non-affiliated external healthcare facilities or entities on a secure, media free, reciprocally searchable basis with patient authorization for 12 months following the date of the study. Clinical history: Ventral hernia, abdominal pain EXAMINATION: CT scan of the abdomen and pelvis 01/29/2018. 5 mm spiral scanning is performed from the costophrenic angles to the symphysis pubis. Coronal and sagittal reconstruction imaging has been obtained. FINDINGS: There are bilateral bullae. Osteopenia and degenerative changes of the spine. Right hip prosthesis. There are splenic hypodensities in size from study of 08/27/2016. Spleen, adrenal glands and bladder are unremarkable. Uterus is absent. Subcentimeter renal hypodensities too small to characterize. 7 mm nonobstructing right nephrolithiasis. There are colonic diverticuli. Colon, terminal ileum, appendix, stomach and small bowel loops are within normal limits. Abdominal aorta is within  normal limits. Small to borderline lymph nodes at the porta hepatis. Fat and fluid containing anterior abdominal wall hernia with surrounding inflammation suggesting incarceration.     IMPRESSION: 1. Incarcerated fat containing anterior abdominal wall hernia. 2. 7 mm nonobstructing right nephrolithiasis. 3. Diverticulosis. 4. Hysterectomy. 5. Small splenic lesions, increased in size from 08/27/2016.       Echo Results  (Last 48 hours)    None          History of presenting illness:( Per admitting M.D.)  BELISSA KOOY is a 67 y.o. year old female extensive past medical history including COPD, hypertension, insulin dependent diabetes, hyperlipidemia, chronic pain and peripheral neuropathy unknown anterior abdominal wall incarcerated fat hernia presents from home today accompanied by her husband with chief complaint of shortness of breath.  Patient was recently hospitalized here from August 25 August 29 for mild COPD exacerbation and abdominal pain secondary to her known abdominal wall hernia.  She was recommended to have outpatient surgery evaluation once off of all steroids to address the hernia.  She was also treated for mild COPD exacerbation during that hospitalization.  She was discharged with 5 additional days of steroids including prednisone, she states she completed them and felt better for approximately 2 days but for the last 2 days has noticed increased work of breathing, shortness of breath with wheezing.  No productive cough or chest pain associated.    ??  She states that she was given 30 Percocets on the day of discharge and ran out 3 to 4 days ago.  She states she was taking approximately 6 of them a day.  They were helping with the pain, she was unable to see surgery in the outpatient setting yet but has an appointment later this week.    Hospital course:   COPD with acute exacerbation  Acute respiratory failure ruled out  Lactic acidosis, sepsis ruled out  Anterior abdominal wall .  No  incarceration  Diabetes  mellitus type 2 uncontrolled with hyperglycemia  Hypertension  Hyperlipidemia  Peripheral neuropathy  GERD  Chronic pain syndrome  History hepatitis C  Constipation  ??  Treatment plan  Patient admitted to hospital with severe COPD except patient was placed on BiPAP.  Currently off BiPAP.  Shortness of breath is improving.   Received 5 days of steroids.  O2 sat is normal.  Recommended outpatient follow-up with primary pulmonologist.  Abdominal CT scan reviewed and shows fat-containing ventral abdominal hernia with some fat inflammation and  Ischemia. No  Bowel present in the hernia.  Findings discussed with Dr. Eulas Post general surgeon and he recommends the patient does not need any intervention.  She is stable for discharge and follow-up with Dr. Eulas Post as outpatient.  She will need elective repair when respiratory status improved..  Constipation better.  Blood sugar improving.  Clonidine decreased to 0.1 mg twice daily and losartan decreased to 25 mg.  Patient is feeling better.  She is being discharged home with outpatient follow-up.  Advised to return to hospital for any new or worsening symptoms.    Vitals on Discharge:  Visit Vitals  BP 102/64 (BP 1 Location: Right arm, BP Patient Position: Supine)   Pulse 94   Temp 97.1 ??F (36.2 ??C)   Resp 18   Ht '5\' 8"'  (1.727 m)   Wt 84.4 kg (186 lb 1.1 oz)   SpO2 99%   Breastfeeding? No   BMI 28.29 kg/m??       Physical Exam:  Constitutional:??Awake and alert, NAD  HENT:??atraumatic, normocephalic, oropharynx clear ??  Eyes:??????conjunctiva normal  Neck:??????supple and trachea normal  Cardiovascular:?? Regular rate and rhythm, heart sounds normal, intact distal pulses  Pulmonary/Chest Wall: breath sounds normal and effort normal  Abdominal:????????????appearance normal, soft, non-tender upon palpation,bowel sounds normal.  Neurological:??????awake, alert and oriented, CN intact, no focal neuro deficits, moves all extremities equally .  Extremities:???? No clubbing or  cyanosis. Pedal pulses 2+ b/l.       Discharge condition:  improved    Discharge activity and restrictions: Activity as tolerated    Time spent with discharging patient:   Discharge plan discussed with pt. All questions answered. Need for compliance with medicine and follow up emphasized. Pt verbalized the understanding of care. Time spent 35 minutes.      Clearnce Sorrel, MD  02/24/2018  10:23 AM    Copies: Wallene Huh, PA-C

## 2018-03-01 ENCOUNTER — Ambulatory Visit: Attending: Physician Assistant | Primary: Physician Assistant

## 2018-03-01 ENCOUNTER — Ambulatory Visit
Admit: 2018-03-01 | Discharge: 2018-03-01 | Payer: MEDICARE | Attending: Physician Assistant | Primary: Physician Assistant

## 2018-03-01 DIAGNOSIS — J441 Chronic obstructive pulmonary disease with (acute) exacerbation: Secondary | ICD-10-CM

## 2018-03-01 MED ORDER — KETOROLAC TROMETHAMINE 60 MG/2 ML IM
60 mg/2 mL | Freq: Once | INTRAMUSCULAR | 0 refills | Status: AC
Start: 2018-03-01 — End: 2018-03-01

## 2018-03-01 MED ORDER — GABAPENTIN 300 MG CAP
300 mg | ORAL_CAPSULE | Freq: Three times a day (TID) | ORAL | 1 refills | Status: DC
Start: 2018-03-01 — End: 2020-06-02

## 2018-03-01 MED ORDER — OXYCODONE-ACETAMINOPHEN 5 MG-325 MG TAB
5-325 mg | ORAL_TABLET | Freq: Four times a day (QID) | ORAL | 0 refills | Status: AC | PRN
Start: 2018-03-01 — End: 2018-03-04

## 2018-03-01 NOTE — Patient Instructions (Signed)
COPD Exacerbation Plan: Care Instructions  Your Care Instructions    If you have chronic obstructive pulmonary disease (COPD), your usual shortness of breath could suddenly get worse. You may start coughing more and have more mucus. This flare-up is called a COPD exacerbation (say "[id number]-BAY-[id number]").  A lung infection or air pollution could set off an exacerbation. Sometimes it can happen after a quick change in temperature or being around chemicals.  Work with your doctor to make a plan for dealing with an exacerbation. You can better manage it if you plan ahead.  Follow-up care is a key part of your treatment and safety. Be sure to make and go to all appointments, and call your doctor if you are having problems. It's also a good idea to know your test results and keep a list of the medicines you take.  How can you care for yourself at home?  During an exacerbation  ?? Do not panic if you start to have one. Quick treatment at home may help you prevent serious breathing problems. If you have a COPD exacerbation plan that you developed with your doctor, follow it.  ?? Take your medicines exactly as your doctor tells you.  ? Use your inhaler as directed by your doctor. If your symptoms do not get better after you use your medicine, have someone take you to the emergency room. Call an ambulance if necessary.  ? With inhaled medicines, a spacer or a nebulizer may help you get more medicine to your lungs. Ask your doctor or pharmacist how to use them properly. Practice using the spacer in front of a mirror before you have an exacerbation. This may help you get the medicine into your lungs quickly.  ? If your doctor has given you steroid pills, take them as directed.  ? Your doctor may have given you a prescription for antibiotics, which you can fill if you need it.  ? Talk to your doctor if you have any problems with your medicine. And call your doctor if you have to use your antibiotic or steroid pills.   Preventing an exacerbation  ?? Do not smoke. This is the most important step you can take to prevent more damage to your lungs and prevent problems. If you already smoke, it is never too late to stop. If you need help quitting, talk to your doctor about stop-smoking programs and medicines. These can increase your chances of quitting for good.  ?? Take your daily medicines as prescribed.  ?? Avoid colds and flu.  ? Get a pneumococcal vaccine.  ? Get a flu vaccine each year, as soon as it is available. Ask those you live or work with to do the same, so they will not get the flu and infect you.  ? Try to stay away from people with colds or the flu.  ? Wash your hands often.  ?? Avoid secondhand smoke; air pollution; cold, dry air; hot, humid air; and high altitudes. Stay at home with your windows closed when air pollution is bad.  ?? Learn breathing techniques for COPD, such as breathing through pursed lips. These techniques can help you breathe easier during an exacerbation.  When should you call for help?  Call 911 anytime you think you may need emergency care. For example, call if:  ?? ?? You have severe trouble breathing.   ?? ?? You have severe chest pain.   ??Call your doctor now or seek immediate medical care if:  ?? ?? You   have new or worse shortness of breath.   ?? ?? You develop new chest pain.   ?? ?? You are coughing more deeply or more often, especially if you notice more mucus or a change in the color of your mucus.   ?? ?? You cough up blood.   ?? ?? You have new or increased swelling in your legs or belly.   ?? ?? You have a fever.   ??Watch closely for changes in your health, and be sure to contact your doctor if:  ?? ?? You need to use your antibiotic or steroid pills.   ?? ?? Your symptoms are getting worse.   Where can you learn more?  Go to http://www.healthwise.net/[employer].  Enter U536 in the search box to learn more about "COPD Exacerbation Plan: Care Instructions."  Current as of: November 19, 2017   Content Version: 12.2  ?? 2006-2019 [employer]. Care instructions adapted under license by [employer] (which disclaims liability or warranty for this information). If you have questions about a medical condition or this instruction, always ask your healthcare professional. [employer] disclaims any warranty or liability for your use of this information.

## 2018-03-01 NOTE — Progress Notes (Signed)
Patient discharged from [facility] 02/24/18 for COPD exacerbation.  Patient saw PCP today, she is not a candidate for surgery until COPD issues improve.  Called patient, LM with [facility] contact information requesting a return call.  Will continue to follow.

## 2018-03-01 NOTE — Progress Notes (Signed)
[name] [name] is a 67 y.o. female who presents to the office today for hospital follow up.  She comes in for hospital follow up. She was admitted to [facility] from 9/8-9/14 for COPD exacerbation. She was placed on BiPAP and treated with steroids. Her breathing improved and BiPAP removed. She was still having significant abdominal pain due to her hernia. Abdominal CT scan showed a fat-containing ventral abdominal hernia with some fat inflammation??and????Ischemia, Dr. Eulas Post with general surgery was consulted and he recommended outpatient followup. This was already planned but her surgeon was not going to repair her hernia until her pulmonologist ensured her COPD was stable. BP was running low. Her Clonidine was decreased to 0.1 mg twice daily and losartan decreased to 25 mg. She was discharged home with percocet for her abdominal pain 2/2 ventral hernia.   She comes in today with her husband. She says her breathing has been stable since discharge. Her main complaint today is her abdominal pain. She has an appt coming up soon with her Pulmonologist and then she will be seeing her general surgeon to plan surgery.        Chief Complaint   Patient presents with   ??? Hospital Follow Up   .    Current Outpatient Medications on File Prior to Visit   Medication Sig Dispense Refill   ??? polyethylene glycol (MIRALAX) 17 gram packet Take 1 Packet by mouth daily. 30 Packet 1   ??? pantoprazole (PROTONIX) 40 mg tablet Take 1 Tab by mouth Daily (before breakfast). 30 Tab 0   ??? senna-docusate (PERICOLACE) 8.6-50 mg per tablet Take 1 Tab by mouth daily. 30 Tab 0   ??? cloNIDine HCl (CATAPRES) 0.1 mg tablet Take 3 Tabs by mouth two (2) times a day. Take bid 60 Tab 0   ??? losartan (COZAAR) 25 mg tablet Take 1 Tab by mouth daily. (Patient taking differently: Take 12.5 mg by mouth daily.) 30 Tab 0   ??? celecoxib (CELEBREX) 200 mg capsule Take 1 Cap by mouth two (2) times a day for 30 days. 60 Cap 0    ??? magnesium oxide (MAG-OX) 400 mg tablet Take 400 mg by mouth daily.     ??? montelukast (SINGULAIR) 10 mg tablet Take 10 mg by mouth daily.     ??? insulin glargine (LANTUS SOLOSTAR U-100 INSULIN) 100 unit/mL (3 mL) inpn 32 Units by SubCUTAneous route nightly. 15 mL 2   ??? metFORMIN ER (GLUCOPHAGE XR) 500 mg tablet Take 1 Tab by mouth two (2) times a day. 60 Tab 2   ??? Insulin Needles, Disposable, (NANO PEN NEEDLE) 32 gauge x 5/32" ndle To use with Lantus pen 100 Pen Needle 0   ??? dilTIAZem CD (CARDIZEM CD) 180 mg ER capsule Take 1 Cap by mouth daily. 90 Cap 1   ??? spironolactone (ALDACTONE) 100 mg tablet TAKE 1 TABLET BY MOUTH DAILY 90 Tab 0   ??? ondansetron (ZOFRAN ODT) 4 mg disintegrating tablet Take 1 Tab by mouth every eight (8) hours as needed for Nausea. Indications: nausea 12 Tab 0   ??? albuterol (ACCUNEB) 1.25 mg/3 mL nebu 3 mL by Nebulization route every six (6) hours as needed (SOB, wheezing). Indications: Asthma Attack 100 Each 0   ??? albuterol-ipratropium (DUO-NEB) 2.5 mg-0.5 mg/3 ml nebu 3 mL by Nebulization route every six (6) hours as needed.     ??? Blood Glucose Control High&Low (ACCU-CHEK AVIVA CONTROL SOLN) soln Check blood sugar daily 1 Bottle 0   ??? Blood-Glucose  Meter (ACCU-CHEK AVIVA PLUS METER) misc Check blood sugar daily 1 Each 0   ??? glucose blood VI test strips (ACCU-CHEK AVIVA PLUS TEST STRP) strip Check blood sugar daily 100 Strip 5   ??? alcohol swabs (BD SINGLE USE SWABS REGULAR) padm Check blood sugar daily 100 Pad 5   ??? lancets (ACCU-CHEK SOFTCLIX LANCETS) misc Check blood sugar daily 100 Each 5   ??? tiotropium bromide (SPIRIVA RESPIMAT) 2.5 mcg/actuation inhaler Take 1 Puff by inhalation daily. 1 Inhaler 0   ??? fluticasone propion-salmeterol (ADVAIR DISKUS) 100-50 mcg/dose diskus inhaler Take 1 Puff by inhalation two (2) times a day. 1 Inhaler 0   ??? traZODone (DESYREL) 50 mg tablet Take 1 Tab by mouth nightly as needed for Sleep. 20 Tab 0    ??? rosuvastatin (CRESTOR) 5 mg tablet TAKE 1 TABLET BY MOUTH NIGHTLY 90 Tab 1   ??? triamcinolone (NASACORT) 55 mcg nasal inhaler 2 Sprays by Both Nostrils route daily. 1 Bottle 0   ??? Blood-Glucose Meter monitoring kit Check sugars 2 times per day, fasting and 2 hours after dinner 1 Kit 0   ??? Lancets misc Check sugars twice daily 200 Each 2   ??? glucose blood VI test strips (ASCENSIA AUTODISC VI, ONE TOUCH ULTRA TEST VI) strip Check sugars twice daily 100 Strip 5   ??? QUEtiapine SR (SEROQUEL XR) 150 mg sr tablet Take 150 mg by mouth nightly.     ??? nabumetone (RELAFEN) 750 mg tablet Take 750 mg by mouth two (2) times a day.       No current facility-administered medications on file prior to visit.      Allergies   Allergen Reactions   ??? Chocolate [Cocoa] Sneezing     Past Medical History:   Diagnosis Date   ??? Arthritis    ??? 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 COPD (chronic obstructive pulmonary disease) ([place])    ??? DM2 (diabetes mellitus, type 2) ([place])    ??? GERD (gastroesophageal reflux disease)    ??? Hepatitis C     HEP C   ??? HTN (hypertension)    ??? Lung nodule, multiple     (CT 10/2016) Small left upper lobe and 1.7 x 1.6 cm left lower lobe nodules not significantly changed from 07/23/2016 and 03/22/2016   ??? Marijuana use    ??? PTSD (post-traumatic stress disorder)     lived through the [place] in 1993   ??? Thoracic ascending aortic aneurysm ([place])     4.4cm noted on CT Chest (10/2016)   ??? Thyroid nodule     2.5 cm stable right thyroid nodule (CT 10/2016)     Social History     Tobacco Use   Smoking Status Former Smoker   Smokeless Tobacco Never Used   Tobacco Comment    reported as quit smoking around 1990s per spouse     Social History     Substance and Sexual Activity   Alcohol Use No     Family History   Problem Relation Age of Onset   ??? Hypertension Mother    ??? Other Mother         OSA   ??? Cancer Father         suspected leukemia per pt's spouse   ??? Cancer Maternal Aunt     ??? Alcohol abuse Maternal Grandfather        Review of Systems   Constitutional:  Positive for malaise/fatigue. Negative for chills, diaphoresis and fever.   Respiratory: Negative for cough, sputum production, shortness of breath and wheezing.    Cardiovascular: Negative for chest pain and palpitations.   Gastrointestinal: Positive for abdominal pain. Negative for blood in stool, constipation, melena, nausea and vomiting.   Musculoskeletal: Negative for falls.   Neurological: Negative for dizziness and loss of consciousness.     Visit Vitals  BP 100/69 (BP 1 Location: Right arm, BP Patient Position: Sitting)   Pulse 87   Temp 98.6 ??F (37 ??C) (Oral)   Resp 18   Ht 5' 8"  (1.727 m)   Wt 193 lb (87.5 kg)   SpO2 96%   BMI 29.35 kg/m??     Physical Exam   Constitutional: She is oriented to person, place, and time and well-developed, well-nourished, and in no distress.   Neck: Neck supple.   Cardiovascular: Normal rate, regular rhythm and normal heart sounds.   Pulmonary/Chest: Effort normal and breath sounds normal. No respiratory distress. She has no wheezes.   Abdominal: Soft. She exhibits no distension. There is tenderness in the epigastric area. A hernia is present.   Musculoskeletal: She exhibits no edema.   Lymphadenopathy:     She has no cervical adenopathy.   Neurological: She is alert and oriented to person, place, and time.   Psychiatric: Mood and affect normal.   Nursing note and vitals reviewed.      Assessment and Plan    ICD-10-CM ICD-9-CM    1. COPD exacerbation ([place]) J44.1 491.21    2. Hernia of abdominal wall K43.9 553.20    3. Abdominal pain, unspecified abdominal location R10.9 789.00 ketorolac tromethamine (TORADOL) 60 mg/2 mL soln      oxyCODONE-acetaminophen (PERCOCET) 5-325 mg per tablet   4. Chronic pain of right lower extremity M79.604 729.5 ketorolac tromethamine (TORADOL) 60 mg/2 mL soln    G89.29 338.29 oxyCODONE-acetaminophen (PERCOCET) 5-325 mg per tablet    5. Encounter for immunization Z23 V03.89 INFLUENZA VACCINE INACTIVATED (IIV), SUBUNIT, ADJUVANTED, IM   6. Chronic right hip pain M25.551 719.45 gabapentin (NEURONTIN) 300 mg capsule    G89.29 338.29    7. Essential hypertension I10 401.9      COPD is stable. Follow up with Pulmonologist already scheduled  Continued abdominal pain 2/2 ventral hernia. She has appt scheduled with general surgeon to discuss surgical repair. Given short course of pain meds for pain control. PMP reviewed and without discrepancy.   Toradol IM today in the office for chronic right hip and leg pain  BP is stable today. No change to current meds  Flu shot today.   Reviewed medication and side effects. Patient agrees with the plan and verbalizes understanding.     Follow-up and Dispositions    ?? Return in about 1 month (around 03/31/2018) for DM, COPD, BP.         Wallene Huh, PA-C  03/17/2018    PLEASE NOTE:  This document has been produced using voice recognition software. Unrecognized errors in transcription may be present.

## 2018-03-01 NOTE — Progress Notes (Signed)
[name] [name] is a 67 y.o. female here today for a hospital follow up for her hernia. She can't have surgery until her pulmonary issues are taken care of.

## 2018-03-01 NOTE — Progress Notes (Signed)
[name] [name] is a 67 y.o. female who presents to the office today for hospital follow up.  She comes in for hospital follow up. She was admitted to [facility] from 9/8-9/14 for COPD exacerbation. She was placed on BiPAP and treated with steroids. Her breathing improved and BiPAP removed. She was still having significant abdominal pain due to her hernia. Abdominal CT scan showed a fat-containing ventral abdominal hernia with some fat inflammation??and????Ischemia, Dr. Eulas [name] with general surgery was consulted and he recommended outpatient followup. This was already planned but her surgeon was not going to repair her hernia until her pulmonologist ensured her COPD was stable. BP was running low. Her Clonidine was decreased to 0.1 mg twice daily and losartan decreased to 25 mg. She was discharged home with percocet for her abdominal pain 2/2 ventral hernia.   She comes in today with her husband. She says her breathing has been stable since discharge. Her main complaint today is her abdominal pain. She has an appt coming up soon with her Pulmonologist and then she will be seeing her general surgeon to plan surgery.        Chief Complaint   Patient presents with   ??? Hospital Follow Up   .    Current Outpatient Medications on File Prior to Visit   Medication Sig Dispense Refill   ??? polyethylene glycol (MIRALAX) 17 gram packet Take 1 Packet by mouth daily. 30 Packet 1   ??? pantoprazole (PROTONIX) 40 mg tablet Take 1 Tab by mouth Daily (before breakfast). 30 Tab 0   ??? senna-docusate (PERICOLACE) 8.6-50 mg per tablet Take 1 Tab by mouth daily. 30 Tab 0   ??? cloNIDine HCl (CATAPRES) 0.1 mg tablet Take 3 Tabs by mouth two (2) times a day. Take bid 60 Tab 0   ??? losartan (COZAAR) 25 mg tablet Take 1 Tab by mouth daily. (Patient taking differently: Take 12.5 mg by mouth daily.) 30 Tab 0   ??? celecoxib (CELEBREX) 200 mg capsule Take 1 Cap by mouth two (2) times a day for 30 days. 60 Cap 0   ??? magnesium oxide (MAG-OX) 400 mg tablet Take 400  mg by mouth daily.     ??? montelukast (SINGULAIR) 10 mg tablet Take 10 mg by mouth daily.     ??? insulin glargine (LANTUS SOLOSTAR U-100 INSULIN) 100 unit/mL (3 mL) inpn 32 Units by SubCUTAneous route nightly. 15 mL 2   ??? metFORMIN ER (GLUCOPHAGE XR) 500 mg tablet Take 1 Tab by mouth two (2) times a day. 60 Tab 2   ??? Insulin Needles, Disposable, (NANO PEN NEEDLE) 32 gauge x 5/32" ndle To use with Lantus pen 100 Pen Needle 0   ??? dilTIAZem CD (CARDIZEM CD) 180 mg ER capsule Take 1 Cap by mouth daily. 90 Cap 1   ??? spironolactone (ALDACTONE) 100 mg tablet TAKE 1 TABLET BY MOUTH DAILY 90 Tab 0   ??? ondansetron (ZOFRAN ODT) 4 mg disintegrating tablet Take 1 Tab by mouth every eight (8) hours as needed for Nausea. Indications: nausea 12 Tab 0   ??? albuterol (ACCUNEB) 1.25 mg/3 mL nebu 3 mL by Nebulization route every six (6) hours as needed (SOB, wheezing). Indications: Asthma Attack 100 Each 0   ??? albuterol-ipratropium (DUO-NEB) 2.5 mg-0.5 mg/3 ml nebu 3 mL by Nebulization route every six (6) hours as needed.     ??? Blood Glucose Control High&Low (ACCU-CHEK AVIVA CONTROL SOLN) soln Check blood sugar daily 1 Bottle 0   ??? Blood-Glucose  Meter (ACCU-CHEK AVIVA PLUS METER) misc Check blood sugar daily 1 Each 0   ??? glucose blood VI test strips (ACCU-CHEK AVIVA PLUS TEST STRP) strip Check blood sugar daily 100 Strip 5   ??? alcohol swabs (BD SINGLE USE SWABS REGULAR) padm Check blood sugar daily 100 Pad 5   ??? lancets (ACCU-CHEK SOFTCLIX LANCETS) misc Check blood sugar daily 100 Each 5   ??? tiotropium bromide (SPIRIVA RESPIMAT) 2.5 mcg/actuation inhaler Take 1 Puff by inhalation daily. 1 Inhaler 0   ??? fluticasone propion-salmeterol (ADVAIR DISKUS) 100-50 mcg/dose diskus inhaler Take 1 Puff by inhalation two (2) times a day. 1 Inhaler 0   ??? traZODone (DESYREL) 50 mg tablet Take 1 Tab by mouth nightly as needed for Sleep. 20 Tab 0   ??? rosuvastatin (CRESTOR) 5 mg tablet TAKE 1 TABLET BY MOUTH NIGHTLY 90 Tab 1   ??? triamcinolone (NASACORT) 55  mcg nasal inhaler 2 Sprays by Both Nostrils route daily. 1 Bottle 0   ??? Blood-Glucose Meter monitoring kit Check sugars 2 times per day, fasting and 2 hours after dinner 1 Kit 0   ??? Lancets misc Check sugars twice daily 200 Each 2   ??? glucose blood VI test strips (ASCENSIA AUTODISC VI, ONE TOUCH ULTRA TEST VI) strip Check sugars twice daily 100 Strip 5   ??? QUEtiapine SR (SEROQUEL XR) 150 mg sr tablet Take 150 mg by mouth nightly.     ??? nabumetone (RELAFEN) 750 mg tablet Take 750 mg by mouth two (2) times a day.       No current facility-administered medications on file prior to visit.      Allergies   Allergen Reactions   ??? Chocolate [Cocoa] Sneezing     Past Medical History:   Diagnosis Date   ??? Arthritis    ??? 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 COPD (chronic obstructive pulmonary disease) ([place])    ??? DM2 (diabetes mellitus, type 2) ([place])    ??? GERD (gastroesophageal reflux disease)    ??? Hepatitis C     HEP C   ??? HTN (hypertension)    ??? Lung nodule, multiple     (CT 10/2016) Small left upper lobe and 1.7 x 1.6 cm left lower lobe nodules not significantly changed from 07/23/2016 and 03/22/2016   ??? Marijuana use    ??? PTSD ([name]-traumatic stress disorder)     lived through the [place] in 1993   ??? Thoracic ascending aortic aneurysm ([place])     4.4cm noted on CT Chest (10/2016)   ??? Thyroid nodule     2.5 cm stable right thyroid nodule (CT 10/2016)     Social History     Tobacco Use   Smoking Status Former Smoker   Smokeless Tobacco Never Used   Tobacco Comment    reported as quit smoking around 1990s per spouse     Social History     Substance and Sexual Activity   Alcohol Use No     Family History   Problem Relation Age of Onset   ??? Hypertension Mother    ??? Other Mother         OSA   ??? Cancer Father         suspected leukemia per pt's spouse   ??? Cancer Maternal Aunt    ??? Alcohol abuse Maternal Grandfather        Review of Systems   Constitutional: Positive  for malaise/fatigue. Negative  for chills, diaphoresis and fever.   Respiratory: Negative for cough, sputum production, shortness of breath and wheezing.    Cardiovascular: Negative for chest pain and palpitations.   Gastrointestinal: Positive for abdominal pain. Negative for blood in stool, constipation, melena, nausea and vomiting.   Musculoskeletal: Negative for falls.   Neurological: Negative for dizziness and loss of consciousness.     Visit Vitals  BP 100/69 (BP 1 Location: Right arm, BP Patient Position: Sitting)   Pulse 87   Temp 98.6 ??F (37 ??C) (Oral)   Resp 18   Ht '5\' 8"'  (1.727 m)   Wt 193 lb (87.5 kg)   SpO2 96%   BMI 29.35 kg/m??     Physical Exam   Constitutional: She is oriented to person, place, and time and well-developed, well-nourished, and in no distress.   Neck: Neck supple.   Cardiovascular: Normal rate, regular rhythm and normal heart sounds.   Pulmonary/Chest: Effort normal and breath sounds normal. No respiratory distress. She has no wheezes.   Abdominal: Soft. She exhibits no distension. There is tenderness in the epigastric area. A hernia is present.   Musculoskeletal: She exhibits no edema.   Lymphadenopathy:     She has no cervical adenopathy.   Neurological: She is alert and oriented to person, place, and time.   Psychiatric: Mood and affect normal.   Nursing note and vitals reviewed.      Assessment and Plan    ICD-10-CM ICD-9-CM    1. COPD exacerbation ([place]) J44.1 491.21    2. Hernia of abdominal wall K43.9 553.20    3. Abdominal pain, unspecified abdominal location R10.9 789.00 ketorolac tromethamine (TORADOL) 60 mg/2 mL soln      oxyCODONE-acetaminophen (PERCOCET) 5-325 mg per tablet   4. Chronic pain of right lower extremity M79.604 729.5 ketorolac tromethamine (TORADOL) 60 mg/2 mL soln    G89.29 338.29 oxyCODONE-acetaminophen (PERCOCET) 5-325 mg per tablet   5. Encounter for immunization Z23 V03.89 INFLUENZA VACCINE INACTIVATED (IIV), SUBUNIT, ADJUVANTED, IM   6. Chronic right hip pain M25.551 719.45 gabapentin  (NEURONTIN) 300 mg capsule    G89.29 338.29    7. Essential hypertension I10 401.9      COPD is stable. Follow up with Pulmonologist already scheduled  Continued abdominal pain 2/2 ventral hernia. She has appt scheduled with general surgeon to discuss surgical repair. Given short course of pain meds for pain control. PMP reviewed and without discrepancy.   Toradol IM today in the office for chronic right hip and leg pain  BP is stable today. No change to current meds  Flu shot today.   Reviewed medication and side effects. Patient agrees with the plan and verbalizes understanding.     Follow-up and Dispositions    ?? Return in about 1 month (around 03/31/2018) for DM, COPD, BP.         Wallene Huh, PA-C  03/17/2018    PLEASE NOTE:  This document has been produced using voice recognition software. Unrecognized errors in transcription may be present.

## 2018-03-01 NOTE — Progress Notes (Signed)
Patient discharged from [facility] 02/24/18 for COPD exacerbation.  Patient saw PCP today, she is not a candidate for surgery until COPD issues improve.  Called patient, LM with NN contact information requesting a return call.  Will continue to follow.

## 2018-03-01 NOTE — Progress Notes (Signed)
[name] [name] is a 67 y.o. female here today for a hospital follow up for her hernia. She can't have surgery until her pulmonary issues are taken care of.

## 2018-03-02 NOTE — Progress Notes (Signed)
Second hospital follow up call to patient.  ID verified.  NN noticed patient's voice was weak, when questioned if she was currently SOB, patient stated yes.  Asked if she had done her rescue inhaler, patient responded she just did it before answering call.  She was ok, this happens to her.  NN made sure someone was in the home with her, reviewed the zones and reminded her when to activate EMS if needed.  Will continue to follow.

## 2018-03-02 NOTE — Progress Notes (Signed)
Second hospital follow up call to patient.  ID verified.  NN noticed patient's voice was weak, when questioned if she was currently SOB, patient stated yes.  Asked if she had done her rescue inhaler, patient responded she just did it before answering call.  She was ok, this happens to her.  NN made sure someone was in the home with her, reviewed the zones and reminded her when to activate EMS if needed.  Will continue to follow.

## 2018-03-05 NOTE — Progress Notes (Signed)
Second follow up call to patient.  LM with NN contact information, will continue to follow.

## 2018-03-05 NOTE — Progress Notes (Signed)
Second follow up call to patient.  LM with NN contact information, will continue to follow.

## 2018-03-14 ENCOUNTER — Encounter

## 2018-03-14 MED ORDER — HYDROCODONE-ACETAMINOPHEN 5 MG-325 MG TAB
5-325 mg | ORAL_TABLET | ORAL | 0 refills | Status: AC | PRN
Start: 2018-03-14 — End: 2018-03-21

## 2018-03-14 NOTE — Progress Notes (Signed)
Complex Case Management Follow Up    Patient Outreach made to patient. She states she is doing fine. She is doing her breathing treatments twice a day. She saw Dr. Darrol Poke pulmonary on 03/09/18. He cleared her for surgery. She will see Dr. Montez Morita general surgeon on 03/20/18 to discuss surgery. No assistance is needed at this time. She called in to pcp for medication refill earlier today.  Patient reminded that there are physicians on call 24 hours a day / 7 days a week (M-F 5pm to 8am and from Friday 5pm until Monday 8a for the weekend) should the patient have questions or concerns.?? Patient reminded to call 911 if situation is emergent or patient feels the situation is emergent.?? Pt verbalizes understanding.    No future appointments.     Care Coordinator(CC) contacted the patient by telephone to perform post hospital discharge assessment. Verified name and DOB with patient as identifiers. Provided introduction to self, and explanation of the Care Coordinator role.

## 2018-03-14 NOTE — Progress Notes (Signed)
 Complex Case Management Follow Up    Patient Outreach made to patient. She states she is doing fine. She is doing her breathing treatments twice a day. She saw Dr. Maude Rouge pulmonary on 03/09/18. He cleared her for surgery. She will see Dr. Franchot general surgeon on 03/20/18 to discuss surgery. No assistance is needed at this time. She called in to pcp for medication refill earlier today.  Patient reminded that there are physicians on call 24 hours a day / 7 days a week (M-F 5pm to 8am and from Friday 5pm until Monday 8a for the weekend) should the patient have questions or concerns. Patient reminded to call 911 if situation is emergent or patient feels the situation is emergent. Pt verbalizes understanding.    No future appointments.     Care Coordinator(CC) contacted the patient by telephone to perform post hospital discharge assessment. Verified name and DOB with patient as identifiers. Provided introduction to self, and explanation of the Care Coordinator role.

## 2018-03-15 NOTE — Progress Notes (Signed)
NN called and Spoke with Patient and Patient's Spouse on phone. NN introduces self role and purpose of call.  NN notified to patient's and spouse that medication Rx is ready to be picked up.   Patient given NN permission to speak with her husband Mr. Hughley on phone. Mr. Oatis states that patient is back to her breathing  baseline. Patient is doing good at this time.  Pt. Bg has been running 80-110 and BP 100/83.     Patient spouse states that cold weather and Patient's Asthma normally triggers Pt. COPD symptoms.     Unable to ask further question as Pt. Spouse kept the conversation short and ended the call.

## 2018-03-15 NOTE — Progress Notes (Signed)
NN called and Spoke with Patient and Patient's Spouse on phone. NN introduces self role and purpose of call.  NN notified to patient's and spouse that medication Rx is ready to be picked up.   Patient given NN permission to speak with her husband Mr. Cillo on phone. Mr. Risso states that patient is back to her breathing  baseline. Patient is doing good at this time.  Pt. Bg has been running 80-110 and BP 100/83.     Patient spouse states that cold weather and Patient's Asthma normally triggers Pt. COPD symptoms.     Unable to ask further question as Pt. Spouse kept the conversation short and ended the call.

## 2018-03-21 NOTE — Progress Notes (Signed)
Complex Case management Follow Up    Care Coordinator(CC) contacted the patient by telephone to perform complex case management follow up. Verified name and DOB with patient as identifiers. Provided introduction to self, and explanation of the Care Coordinator role.  She states she is doing fine. She saw Dr. Clark on Tuesday. A date has been set for surgery. She can't remember the date but it is in October. She denies having any issues with her breathing at this time. No assistance is needed from PA Brock.    Patient reminded that there are physicians on call 24 hours a day / 7 days a week (M-F 5pm to 8am and from Friday 5pm until Monday 8a for the weekend) should the patient have questions or concerns.?? Patient reminded to call 911 if situation is emergent or patient feels the situation is emergent.?? Pt verbalizes understanding.  Goals    None     No future appointments.

## 2018-03-21 NOTE — Progress Notes (Signed)
 Complex Case management Follow Up    Care Coordinator(CC) contacted the patient by telephone to perform complex case management follow up. Verified name and DOB with patient as identifiers. Provided introduction to self, and explanation of the Care Coordinator role.  She states she is doing fine. She saw Dr. Gretta on Tuesday. A date has been set for surgery. She can't remember the date but it is in October. She denies having any issues with her breathing at this time. No assistance is needed from PA [place].    Patient reminded that there are physicians on call 24 hours a day / 7 days a week (M-F 5pm to 8am and from Friday 5pm until Monday 8a for the weekend) should the patient have questions or concerns. Patient reminded to call 911 if situation is emergent or patient feels the situation is emergent. Pt verbalizes understanding.  Goals    None     No future appointments.

## 2018-03-27 MED ORDER — ALBUTEROL SULFATE 1.25 MG/3 ML (0.042 %) SOLN FOR INHALATION
1.25 mg/3 mL | RESPIRATORY_TRACT | 0 refills | Status: DC
Start: 2018-03-27 — End: 2018-04-22

## 2018-03-27 MED ORDER — MONTELUKAST 10 MG TAB
10 mg | ORAL_TABLET | ORAL | 3 refills | Status: AC
Start: 2018-03-27 — End: ?

## 2018-03-27 NOTE — Telephone Encounter (Signed)
Patient caregiver is request a 90 day supply of the albuterol nebulization soultion

## 2018-03-30 MED ORDER — METFORMIN SR 500 MG 24 HR TABLET
500 mg | ORAL_TABLET | ORAL | 1 refills | Status: AC
Start: 2018-03-30 — End: ?

## 2018-04-02 NOTE — Progress Notes (Signed)
ACM made complex care out reach call. Introduced self and role ,as patient has spoken to several ambulatory care staff. Patient state's she just completed her am breathing treatment. She is scheduled to do twice a day routinely. No SOB or any coughing noted or voiced. Patient state's she is scheduled for her abdominal hernia repair Nov.11 th at [facility] by Dr Carter ( [place]) She has had her follow up with her pulmonologist and PCP. She feels that she is doing very well.

## 2018-04-02 NOTE — Progress Notes (Signed)
ACM made complex care out reach call. Introduced self and role ,as patient has spoken to several ambulatory care staff. Patient state's she just completed her am breathing treatment. She is scheduled to do twice a day routinely. No SOB or any coughing noted or voiced. Patient state's she is scheduled for her abdominal hernia repair Nov.11 th at [facility] by Dr Montez Morita Onalee Hua) She has had her follow up with her pulmonologist and PCP. She feels that she is doing very well.

## 2018-04-03 ENCOUNTER — Institutional Professional Consult (permissible substitution)
Admit: 2018-04-03 | Discharge: 2018-04-03 | Payer: MEDICARE | Attending: Physician Assistant | Primary: Physician Assistant

## 2018-04-03 DIAGNOSIS — K439 Ventral hernia without obstruction or gangrene: Secondary | ICD-10-CM

## 2018-04-03 MED ORDER — KETOROLAC TROMETHAMINE 30 MG/ML INJECTION
30 mg/mL (1 mL) | Freq: Once | INTRAMUSCULAR | 0 refills | Status: AC
Start: 2018-04-03 — End: 2018-04-03

## 2018-04-03 NOTE — Progress Notes (Signed)
[name] [name] is a 67 y.o. female here today with her husband for a toradol injection for pain related to her hernia. Patient states she has surgery scheduled for 04-23-2018. She tolerated well and left showing no s/s of distress.

## 2018-04-03 NOTE — Progress Notes (Signed)
[name] [name] is a 67 y.o. female here today with her husband for a toradol injection for pain related to her hernia. Patient states she has surgery scheduled for 04-23-2018. She tolerated well and left showing no s/s of distress.

## 2018-04-05 NOTE — Progress Notes (Signed)
ACM made complex care out reach call. Introduced self and role ,as patient has spoken to several ambulatory care staff. Patient state's she just completed her am breathing treatment. She is scheduled to do twice a day routinely. No SOB or any coughing noted or voiced. Patient state's she is scheduled for her abdominal hernia repair Nov.11 th at [facility] by Dr Carter ( [place]) She has had her follow up with her pulmonologist and PCP. She feels that she is doing very well.

## 2018-04-06 ENCOUNTER — Encounter

## 2018-04-06 MED ORDER — NABUMETONE 750 MG TAB
750 mg | ORAL_TABLET | ORAL | 0 refills | Status: DC
Start: 2018-04-06 — End: 2018-04-19

## 2018-04-19 ENCOUNTER — Encounter

## 2018-04-19 NOTE — Anesthesia Pre-Procedure Evaluation (Addendum)
Relevant Problems   No relevant active problems       Anesthetic History     PONV          Review of Systems / Medical History  Patient summary reviewed, nursing notes reviewed and pertinent labs reviewed    Pulmonary    COPD (Hosp Adm for COPD exacerbation 01/2018 and 02/2018- Bipap and steroids; F/u PCP 03/01/18; Pulm clearance 03/09/18- Increased risk & PFT FEV1 .55, FEV1/FVC .62- Very severe obstruction, low vital capacity, cannot r/o superimposed restriction ): mild          Pertinent negatives: No smoker (Former cig smoker, quit 1990's; current Marijuana use)     Neuro/Psych         Psychiatric history  Pertinent negatives: Dementia: PTSD.   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

## 2018-04-19 NOTE — Anesthesia Pre-Procedure Evaluation (Signed)
Relevant Problems   No relevant active problems       Anesthetic History     PONV          Review of Systems / Medical History  Patient summary reviewed, nursing notes reviewed and pertinent labs reviewed    Pulmonary    COPD (Hosp Adm for COPD exacerbation 01/2018 and 02/2018- Bipap and steroids; F/u PCP 03/01/18; Pulm clearance 03/09/18- Increased risk & PFT FEV1 .55, FEV1/FVC .62- Very severe obstruction, low vital capacity, cannot r/o superimposed restriction ): mild          Pertinent negatives: No smoker (Former cig smoker, quit 1990's; current Marijuana use)     Neuro/Psych         Psychiatric history  Pertinent negatives: Dementia: PTSD.   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

## 2018-04-23 ENCOUNTER — Inpatient Hospital Stay: Payer: MEDICARE

## 2018-04-23 ENCOUNTER — Ambulatory Visit: Payer: MEDICARE | Primary: Physician Assistant

## 2018-04-23 LAB — POCT GLUCOSE: POC Glucose: 134 mg/dL — ABNORMAL HIGH (ref 65–105)

## 2018-04-23 LAB — GLUCOSE, POC: Glucose (POC): 134 mg/dL — ABNORMAL HIGH (ref 65–105)

## 2018-04-23 MED ORDER — LIDOCAINE HCL 1 % (10 MG/ML) IJ SOLN
10 mg/mL (1 %) | Freq: Once | INTRAMUSCULAR | Status: DC | PRN
Start: 2018-04-23 — End: 2018-04-23

## 2018-04-23 MED ORDER — OXYCODONE-ACETAMINOPHEN 5 MG-325 MG TAB
5-325 mg | ORAL_TABLET | ORAL | 0 refills | Status: AC | PRN
Start: 2018-04-23 — End: 2018-04-30

## 2018-04-23 MED ORDER — EPHEDRINE SULFATE 50 MG/ML INJECTION SOLUTION
50 mg/mL | INTRAMUSCULAR | Status: DC | PRN
Start: 2018-04-23 — End: 2018-04-23
  Administered 2018-04-23 (×2): via INTRAVENOUS

## 2018-04-23 MED ORDER — NALOXONE 0.4 MG/ML INJECTION
0.4 mg/mL | INTRAMUSCULAR | Status: DC | PRN
Start: 2018-04-23 — End: 2018-04-23

## 2018-04-23 MED ORDER — LACTATED RINGERS IV
INTRAVENOUS | Status: DC
Start: 2018-04-23 — End: 2018-04-23
  Administered 2018-04-23: 12:00:00 via INTRAVENOUS

## 2018-04-23 MED ORDER — LIDOCAINE (PF) 20 MG/ML (2 %) IJ SOLN
20 mg/mL (2 %) | INTRAMUSCULAR | Status: DC | PRN
Start: 2018-04-23 — End: 2018-04-23
  Administered 2018-04-23: 13:00:00 via INTRAVENOUS

## 2018-04-23 MED ORDER — PROPOFOL 10 MG/ML IV EMUL
10 mg/mL | INTRAVENOUS | Status: DC | PRN
Start: 2018-04-23 — End: 2018-04-23
  Administered 2018-04-23: 13:00:00 via INTRAVENOUS

## 2018-04-23 MED ORDER — HYDRALAZINE 20 MG/ML IJ SOLN
20 mg/mL | INTRAMUSCULAR | Status: DC | PRN
Start: 2018-04-23 — End: 2018-04-23

## 2018-04-23 MED ORDER — SUGAMMADEX 100 MG/ML INTRAVENOUS SOLUTION
100 mg/mL | INTRAVENOUS | Status: DC | PRN
Start: 2018-04-23 — End: 2018-04-23
  Administered 2018-04-23: 14:00:00 via INTRAVENOUS

## 2018-04-23 MED ORDER — SODIUM CHLORIDE 0.9 % IJ SYRG
INTRAMUSCULAR | Status: DC | PRN
Start: 2018-04-23 — End: 2018-04-23

## 2018-04-23 MED ORDER — FLUMAZENIL 0.1 MG/ML IV SOLN
0.1 mg/mL | INTRAVENOUS | Status: DC | PRN
Start: 2018-04-23 — End: 2018-04-23

## 2018-04-23 MED ORDER — SODIUM CHLORIDE 0.9 % IV
10 mg/mL | INTRAVENOUS | Status: DC | PRN
Start: 2018-04-23 — End: 2018-04-23
  Administered 2018-04-23: 13:00:00 via INTRAVENOUS

## 2018-04-23 MED ORDER — DEXAMETHASONE SODIUM PHOSPHATE 4 MG/ML IJ SOLN
4 mg/mL | INTRAMUSCULAR | Status: DC | PRN
Start: 2018-04-23 — End: 2018-04-23
  Administered 2018-04-23: 13:00:00 via INTRAVENOUS

## 2018-04-23 MED ORDER — MIDAZOLAM 1 MG/ML IJ SOLN
1 mg/mL | INTRAMUSCULAR | Status: AC
Start: 2018-04-23 — End: ?

## 2018-04-23 MED ORDER — BUPIVACAINE (PF) 0.5 % (5 MG/ML) IJ SOLN
0.5 % (5 mg/mL) | INTRAMUSCULAR | Status: AC
Start: 2018-04-23 — End: ?

## 2018-04-23 MED ORDER — OCTYL 2-CYANOACRYLATE TOPICAL LIQUID
CUTANEOUS | Status: AC
Start: 2018-04-23 — End: ?

## 2018-04-23 MED ORDER — BUPIVACAINE (PF) 0.5 % (5 MG/ML) IJ SOLN
0.5 % (5 mg/mL) | INTRAMUSCULAR | Status: DC | PRN
Start: 2018-04-23 — End: 2018-04-23
  Administered 2018-04-23: 13:00:00 via SUBCUTANEOUS

## 2018-04-23 MED ORDER — KETOROLAC TROMETHAMINE 30 MG/ML INJECTION
30 mg/mL (1 mL) | INTRAMUSCULAR | Status: DC | PRN
Start: 2018-04-23 — End: 2018-04-23
  Administered 2018-04-23: 14:00:00 via INTRAVENOUS

## 2018-04-23 MED ORDER — HYDROMORPHONE 1 MG/ML INJECTION SOLUTION
1 mg/mL | INTRAMUSCULAR | Status: DC | PRN
Start: 2018-04-23 — End: 2018-04-23
  Administered 2018-04-23: 14:00:00 via INTRAVENOUS

## 2018-04-23 MED ORDER — FENTANYL CITRATE (PF) 50 MCG/ML IJ SOLN
50 mcg/mL | INTRAMUSCULAR | Status: DC | PRN
Start: 2018-04-23 — End: 2018-04-23
  Administered 2018-04-23 (×4): via INTRAVENOUS

## 2018-04-23 MED ORDER — ONDANSETRON (PF) 4 MG/2 ML INJECTION
4 mg/2 mL | INTRAMUSCULAR | Status: DC | PRN
Start: 2018-04-23 — End: 2018-04-23
  Administered 2018-04-23: 14:00:00 via INTRAVENOUS

## 2018-04-23 MED ORDER — DIPHENHYDRAMINE HCL 50 MG/ML IJ SOLN
50 mg/mL | INTRAMUSCULAR | Status: DC | PRN
Start: 2018-04-23 — End: 2018-04-23

## 2018-04-23 MED ORDER — OXYCODONE-ACETAMINOPHEN 5 MG-325 MG TAB
5-325 mg | Freq: Once | ORAL | Status: AC
Start: 2018-04-23 — End: 2018-04-23
  Administered 2018-04-23: 15:00:00 via ORAL

## 2018-04-23 MED ORDER — CEFAZOLIN 2 GRAM/50 ML NS IVPB
INTRAVENOUS | Status: AC
Start: 2018-04-23 — End: 2018-04-23
  Administered 2018-04-23: 13:00:00 via INTRAVENOUS

## 2018-04-23 MED ORDER — GLYCOPYRROLATE 0.2 MG/ML IJ SOLN
0.2 mg/mL | INTRAMUSCULAR | Status: DC | PRN
Start: 2018-04-23 — End: 2018-04-23
  Administered 2018-04-23: 13:00:00 via INTRAVENOUS

## 2018-04-23 MED ORDER — FENTANYL CITRATE (PF) 50 MCG/ML IJ SOLN
50 mcg/mL | INTRAMUSCULAR | Status: DC | PRN
Start: 2018-04-23 — End: 2018-04-23

## 2018-04-23 MED ORDER — LABETALOL 5 MG/ML IV SOLN
5 mg/mL | INTRAVENOUS | Status: DC | PRN
Start: 2018-04-23 — End: 2018-04-23
  Administered 2018-04-23 (×3): via INTRAVENOUS

## 2018-04-23 MED ORDER — FENTANYL CITRATE (PF) 50 MCG/ML IJ SOLN
50 mcg/mL | INTRAMUSCULAR | Status: AC
Start: 2018-04-23 — End: ?

## 2018-04-23 MED ORDER — ROCURONIUM 10 MG/ML IV
10 mg/mL | INTRAVENOUS | Status: DC | PRN
Start: 2018-04-23 — End: 2018-04-23
  Administered 2018-04-23: 13:00:00 via INTRAVENOUS

## 2018-04-23 MED ORDER — MIDAZOLAM 1 MG/ML IJ SOLN
1 mg/mL | INTRAMUSCULAR | Status: DC | PRN
Start: 2018-04-23 — End: 2018-04-23
  Administered 2018-04-23: 13:00:00 via INTRAVENOUS

## 2018-04-23 MED ORDER — MORPHINE 2 MG/ML INJECTION
2 mg/mL | INTRAMUSCULAR | Status: DC | PRN
Start: 2018-04-23 — End: 2018-04-23

## 2018-04-23 MED ORDER — LABETALOL 5 MG/ML IV SOLN
5 mg/mL | INTRAVENOUS | Status: DC | PRN
Start: 2018-04-23 — End: 2018-04-23

## 2018-04-23 MED FILL — FENTANYL CITRATE (PF) 50 MCG/ML IJ SOLN: 50 mcg/mL | INTRAMUSCULAR | Qty: 2

## 2018-04-23 MED FILL — LACTATED RINGERS IV: INTRAVENOUS | Qty: 1000

## 2018-04-23 MED FILL — OCTYLSEAL TOPICAL LIQUID: CUTANEOUS | Qty: 6

## 2018-04-23 MED FILL — OXYCODONE-ACETAMINOPHEN 5 MG-325 MG TAB: 5-325 mg | ORAL | Qty: 1

## 2018-04-23 MED FILL — MIDAZOLAM 1 MG/ML IJ SOLN: 1 mg/mL | INTRAMUSCULAR | Qty: 2

## 2018-04-23 MED FILL — CEFAZOLIN 2 GRAM/50 ML NS IVPB: INTRAVENOUS | Qty: 50

## 2018-04-23 MED FILL — BUPIVACAINE (PF) 0.5 % (5 MG/ML) IJ SOLN: 0.5 % (5 mg/mL) | INTRAMUSCULAR | Qty: 30

## 2018-04-23 MED FILL — HYDROMORPHONE 1 MG/ML INJECTION SOLUTION: 1 mg/mL | INTRAMUSCULAR | Qty: 1

## 2018-04-23 NOTE — Anesthesia Post-Procedure Evaluation (Signed)
Procedure(s):  LAPAROSCOPIC UMBILICAL HERNIA REPAIR AND LAPAROSCOPIC VENTRA HERNIAL REPAIR WITH MESH.    general    Anesthesia Post Evaluation      Multimodal analgesia: multimodal analgesia used between 6 hours prior to anesthesia start to PACU discharge  Patient location during evaluation: PACU  Patient participation: complete - patient participated  Level of consciousness: awake and alert  Pain management: adequate  Airway patency: patent  Anesthetic complications: no  Cardiovascular status: acceptable  Respiratory status: acceptable  Hydration status: acceptable  Post anesthesia nausea and vomiting:  none      Vitals Value Taken Time   BP 146/96 04/23/2018  9:45 AM   Temp 36.5 ??C (97.7 ??F) 04/23/2018  9:02 AM   Pulse 81 04/23/2018  9:52 AM   Resp 11 04/23/2018  9:52 AM   SpO2 88 % 04/23/2018  9:52 AM   Vitals shown include unvalidated device data.

## 2018-04-23 NOTE — H&P (Signed)
History and Physical      Patient: [name] [name]               Sex: female          DOA: 04/23/2018         Date of Birth:  10-08-1950      Age:  67 y.o.        LOS: 0      MRN: [id number]  SSN:  [id number]       HPI:     [name] [name] is a 67 y.o. female who presents with a ventral hernia. Patient has had several episodes of severe pain    Past Medical History:   Diagnosis Date   ??? Arthritis    ??? 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 COPD (chronic obstructive pulmonary disease) ([place])    ??? DM2 (diabetes mellitus, type 2) ([place])    ??? GERD (gastroesophageal reflux disease)    ??? Hepatitis C     HEP C   ??? HTN (hypertension)    ??? Lung nodule, multiple     (CT 10/2016) Small left upper lobe and 1.7 x 1.6 cm left lower lobe nodules not significantly changed from 07/23/2016 and 03/22/2016   ??? Marijuana use    ??? PTSD (post-traumatic stress disorder)     lived through the [place] in 1993   ??? Thoracic ascending aortic aneurysm ([place])     4.4cm noted on CT Chest (10/2016)   ??? Thyroid nodule     2.5 cm stable right thyroid nodule (CT 10/2016)       Past Surgical History:   Procedure Laterality Date   ??? HX ENDOSCOPY     ??? HX HIP REPLACEMENT Right 01/29/2015   ??? HX HIP REPLACEMENT     ??? HX HYSTERECTOMY  2010    hysterectomy       Family History   Problem Relation Age of Onset   ??? Hypertension Mother    ??? Other Mother         OSA   ??? Cancer Father         suspected leukemia per pt's spouse   ??? Cancer Maternal Aunt    ??? Alcohol abuse Maternal Grandfather        Social History     Socioeconomic History   ??? Marital status: MARRIED     Spouse name: Not on file   ??? Number of children: Not on file   ??? Years of education: Not on file   ??? Highest education level: Not on file   Tobacco Use   ??? Smoking status: Former Smoker   ??? Smokeless tobacco: Never Used   ??? Tobacco comment: reported as quit smoking around 1990s per spouse   Substance and Sexual Activity   ??? Alcohol use: No   ??? Drug use: Yes      Types: Marijuana     Comment: PTSD   ??? Sexual activity: Never       Prior to Admission medications    Medication Sig Start Date End Date Taking? Authorizing Provider   nabumetone (RELAFEN) 750 mg tablet TAKE 1 TABLET BY MOUTH TWICE DAILY AS NEEDED FOR PAIN 04/06/18  Yes Wallene Huh, PA-C   metFORMIN ER (GLUCOPHAGE XR) 500 mg tablet TAKE 1 TABLET TWICE DAILY 03/30/18  Yes Fransisco Beau, Monique, PA-C   montelukast (SINGULAIR) 10 mg tablet TAKE 1 TABLET EVERY DAY  03/26/18  Yes Brock, Monique, PA-C   albuterol (ACCUNEB) 1.25 mg/3 mL nebu INHALE CONTENTS OF 1 VIAL(3 ML) BY NEBULIZATION ROUTE EVERY 6 HOURS AS NEEDED FOR SHORTNESS OF BREATH, WHEEZING 03/26/18  Yes Fransisco Beau, Monique, PA-C   gabapentin (NEURONTIN) 300 mg capsule Take 1 Cap by mouth three (3) times daily. Max Daily Amount: 900 mg. 03/01/18  Yes Fransisco Beau, Monique, PA-C   polyethylene glycol (MIRALAX) 17 gram packet Take 1 Packet by mouth daily. 02/24/18  Yes Mehmood, Khalid, MD   pantoprazole (PROTONIX) 40 mg tablet Take 1 Tab by mouth Daily (before breakfast). 02/25/18  Yes Mehmood, Khalid, MD   senna-docusate (PERICOLACE) 8.6-50 mg per tablet Take 1 Tab by mouth daily. 02/24/18  Yes Mehmood, Khalid, MD   cloNIDine HCl (CATAPRES) 0.1 mg tablet Take 3 Tabs by mouth two (2) times a day. Take bid 02/24/18  Yes Mehmood, Khalid, MD   losartan (COZAAR) 25 mg tablet Take 1 Tab by mouth daily.  Patient taking differently: Take 12.5 mg by mouth daily. 02/24/18  Yes Mehmood, Khalid, MD   magnesium oxide (MAG-OX) 400 mg tablet Take 400 mg by mouth daily.   Yes Provider, Historical   QUEtiapine SR (SEROQUEL XR) 150 mg sr tablet Take 150 mg by mouth nightly.   Yes Provider, Historical   insulin glargine (LANTUS SOLOSTAR U-100 INSULIN) 100 unit/mL (3 mL) inpn 32 Units by SubCUTAneous route nightly. 01/19/18  Yes Wallene Huh, PA-C   Insulin Needles, Disposable, (NANO PEN NEEDLE) 32 gauge x 5/32" ndle To use with Lantus pen 01/03/18  Yes Brock, Monique, PA-C    dilTIAZem CD (CARDIZEM CD) 180 mg ER capsule Take 1 Cap by mouth daily. 01/02/18  Yes Wallene Huh, PA-C   spironolactone (ALDACTONE) 100 mg tablet TAKE 1 TABLET BY MOUTH DAILY 01/02/18  Yes Fransisco Beau, Monique, PA-C   ondansetron (ZOFRAN ODT) 4 mg disintegrating tablet Take 1 Tab by mouth every eight (8) hours as needed for Nausea. Indications: nausea 12/24/17  Yes Hilton Sinclair, PA-C   nabumetone (RELAFEN) 750 mg tablet Take 750 mg by mouth two (2) times a day.   Yes Provider, Historical   albuterol-ipratropium (DUO-NEB) 2.5 mg-0.5 mg/3 ml nebu 3 mL by Nebulization route every six (6) hours as needed.   Yes Provider, Historical   Blood Glucose Control High&Low (ACCU-CHEK AVIVA CONTROL SOLN) soln Check blood sugar daily 11/23/17  Yes Fransisco Beau, Monique, PA-C   Blood-Glucose Meter (ACCU-CHEK AVIVA PLUS METER) misc Check blood sugar daily 11/23/17  Yes Fransisco Beau, Monique, PA-C   glucose blood VI test strips (ACCU-CHEK AVIVA PLUS TEST STRP) strip Check blood sugar daily 11/23/17  Yes Fransisco Beau, Monique, PA-C   alcohol swabs (BD SINGLE USE SWABS REGULAR) padm Check blood sugar daily 11/23/17  Yes Fransisco Beau, Monique, PA-C   lancets (ACCU-CHEK SOFTCLIX LANCETS) misc Check blood sugar daily 11/23/17  Yes Brock, Monique, PA-C   tiotropium bromide (SPIRIVA RESPIMAT) 2.5 mcg/actuation inhaler Take 1 Puff by inhalation daily. 10/11/17  Yes Holladay, Lorra Hals, MD   fluticasone propion-salmeterol (ADVAIR DISKUS) 100-50 mcg/dose diskus inhaler Take 1 Puff by inhalation two (2) times a day. 10/11/17  Yes Holladay, Lorra Hals, MD   traZODone (DESYREL) 50 mg tablet Take 1 Tab by mouth nightly as needed for Sleep. 08/09/17  Yes Einar Grad, MD   rosuvastatin (CRESTOR) 5 mg tablet TAKE 1 TABLET BY MOUTH NIGHTLY 07/13/17  Yes Lake Bells, MD   triamcinolone (NASACORT) 55 mcg nasal inhaler 2 Sprays by Both Nostrils route daily. 02/22/17  Yes Wallene Huh, PA-C   Blood-Glucose Meter monitoring kit Check sugars 2 times per day, fasting  and 2 hours after dinner 07/15/15  Yes [place] Bells, MD   Lancets misc Check sugars twice daily 07/15/15  Yes Tharakan, Jolson K, MD   glucose blood VI test strips (ASCENSIA AUTODISC VI, ONE TOUCH ULTRA TEST VI) strip Check sugars twice daily 07/15/15  Yes [place] Bells, MD       Allergies   Allergen Reactions   ??? Chocolate [Cocoa] Sneezing     Not an allergy       Review of Systems   CONSTITUTIONAL: No Fever, No chills, No weight loss, No Night sweats  HEENT: No nasal discharge, No PN drainage, No epistaxis, No diff in swallowing  CVS: No chest pain, No palpitations, No syncope, No peripheral edema, No PND, No orthopnea  RS: No shortness of breath, No cough, No hemoptysis, No pleuritic chest pain  GI: No abd pain, No vomitting, No diarrhea, No hematemesis, No rectal bleeding, No acid reflux or heartburn abdominal pain  NEURO: No focal weakness, No headaches, No seizures  PSYCH: No anxiety, No depression  MUSCULOSKLETAL: No joint pain or swelling  GU: No hematuria or dysuria  SKIN: No rash  SLEEP: No snoring, No witnessed apneas, No daytime somnolence or tiredness      Physical Exam:     Visit Vitals  BP (!) 133/91 (BP 1 Location: Left arm, BP Patient Position: At rest;Sitting)   Pulse 92   Temp 98.9 ??F (37.2 ??C)   Resp 24   Ht 5' 8"  (1.727 m)   Wt 86.4 kg (190 lb 5.9 oz)   SpO2 98%   BMI 28.95 kg/m??    Blood pressure (!) 133/91, pulse 92, temperature 98.9 ??F (37.2 ??C), resp. rate 24, height 5' 8"  (1.727 m), weight 86.4 kg (190 lb 5.9 oz), SpO2 98 %.  No intake or output data in the 24 hours ending 04/23/18 0653      Physical Exam:     HEENT: Eyes, Nose, Oral  NECK: No lymphadenopthy or thyroid swelling, JVD not seen  LYMPH: No supraclavicular or cervical or axillary nodes on both sides  CVS: S1S2, No murmurs, P2 not prominent, No gallop or rub  RS: Bilateral Air Entry moderate, No wheezing or crackles  Abd: Soft, non tender, not distended, No guarding, No rigidity, BS normal ventral hernia   NEURO:  Non focal, normal strength.  Unable to examine since patient intubated and sedated.  Extrm: no leg edema or leg swelling or clubbing bilateral  Skin: No rash      Assessment and Plan     Ventral hernia  Plan for laparoscopic ventral hernia repair with mesh  Patient understands risks of bleeding infection and recurrence and wishes to proceed  Hospital Problems:     Hospital Problems  Date Reviewed: 04/22/2018    None          Labs Reviewed:    CBC w/Diff    Lab Results   Component Value Date/Time    WBC 19.4 (H) 02/20/2018 03:35 AM    RBC 4.01 02/20/2018 03:35 AM    HCT 35.5 (L) 02/20/2018 03:35 AM    MCV 88.5 02/20/2018 03:35 AM    MCH 28.7 02/20/2018 03:35 AM    MCHC 32.4 02/20/2018 03:35 AM    RDW 14.6 (H) 09/21/2017 12:00 PM    Lab Results   Component Value Date/Time    MONOS 4.3 02/20/2018 03:35  AM    EOS 0.0 02/20/2018 03:35 AM    BASOS 0.1 02/20/2018 03:35 AM         Lab Results   Component Value Date/Time    WBC 19.4 (H) 02/20/2018 03:35 AM    WBC 17.0 (H) 02/19/2018 04:06 AM    WBC 18.9 (H) 02/18/2018 05:15 PM    WBC 11.5 (H) 02/04/2018 07:40 AM    WBC 12.4 (H) 01/29/2018 11:49 AM    HGB 11.5 (L) 02/20/2018 03:35 AM    HGB 12.3 (L) 02/19/2018 04:06 AM    HGB 14.7 02/18/2018 05:15 PM    HGB 13.4 02/04/2018 07:40 AM    HGB 14.6 01/29/2018 12:50 PM    Hemoglobin (POC) 11.8 12/16/2014 02:13 PM    HCT 35.5 (L) 02/20/2018 03:35 AM    PLATELET 241 02/20/2018 03:35 AM    PLATELET 249 02/19/2018 04:06 AM    PLATELET 336 02/18/2018 05:15 PM    PLATELET 329 02/04/2018 07:40 AM    PLATELET 254 01/29/2018 11:49 AM    MCV 88.5 02/20/2018 03:35 AM       Lab Results   Component Value Date/Time    Sodium 135 (L) 02/20/2018 03:35 AM    Sodium 134 (L) 02/19/2018 04:06 AM    Sodium 136 02/18/2018 05:15 PM    Sodium 135 (L) 02/04/2018 07:40 AM    Sodium 140 01/29/2018 12:50 PM    Potassium 5.1 02/20/2018 03:35 AM    Potassium 4.7 02/19/2018 04:06 AM    Potassium 4.7 02/18/2018 05:15 PM     Potassium 4.0 02/04/2018 07:40 AM    Potassium 4.2 01/29/2018 12:50 PM    Chloride 104 02/20/2018 03:35 AM    Chloride 102 02/19/2018 04:06 AM    Chloride 102 02/18/2018 05:15 PM    Chloride 98 02/04/2018 07:40 AM    Chloride 101 01/29/2018 12:50 PM    CO2 25 02/20/2018 03:35 AM    CO2 22 02/19/2018 04:06 AM    CO2 24 02/18/2018 05:15 PM    CO2 26 02/04/2018 07:40 AM    CO2 28 01/29/2018 11:49 AM    CO2, TOTAL 24.0 02/18/2018 06:03 PM    CO2, TOTAL 27 01/29/2018 12:50 PM    Anion gap 6 02/20/2018 03:35 AM    Anion gap 10 02/19/2018 04:06 AM    Anion gap 10 02/18/2018 05:15 PM    Anion gap 11 02/04/2018 07:40 AM    Anion gap 8 01/29/2018 11:49 AM    Glucose 243 (H) 02/20/2018 03:35 AM    Glucose 330 (H) 02/19/2018 04:06 AM    Glucose 178 (H) 02/18/2018 05:15 PM    Glucose 125 (H) 02/04/2018 07:40 AM    Glucose 109 (H) 01/29/2018 12:50 PM    BUN 38 (H) 02/20/2018 03:35 AM    BUN 37 (H) 02/19/2018 04:06 AM    BUN 31 (H) 02/18/2018 05:15 PM    BUN 19 02/04/2018 07:40 AM    BUN 17 01/29/2018 12:50 PM    Creatinine 1.0 02/20/2018 03:35 AM    Creatinine 1.2 02/19/2018 04:06 AM    Creatinine 1.3 02/18/2018 05:15 PM    Creatinine 1.2 02/04/2018 07:40 AM    Creatinine 1.0 01/29/2018 12:50 PM    BUN/Creatinine ratio 10 (L) 09/20/2017 04:05 PM    BUN/Creatinine ratio 24 (H) 06/10/2016 01:00 PM    BUN/Creatinine ratio 19 08/21/2015 01:40 PM    BUN/Creatinine ratio 21 (H) 01/31/2015 04:30 AM    BUN/Creatinine ratio 24 (H) 01/30/2015  03:30 AM    GFR est AA >60.0 02/20/2018 03:35 AM    GFR est AA 58.0 02/19/2018 04:06 AM    GFR est AA 53.0 02/18/2018 05:15 PM    GFR est AA 58.0 02/04/2018 07:40 AM    GFR est AA >60.0 01/29/2018 11:49 AM    GFR est non-AA 59 02/20/2018 03:35 AM    GFR est non-AA 48 02/19/2018 04:06 AM    GFR est non-AA 44 02/18/2018 05:15 PM    GFR est non-AA 48 02/04/2018 07:40 AM    GFR est non-AA 59 01/29/2018 11:49 AM    Calcium 8.5 02/20/2018 03:35 AM    Calcium 8.3 (L) 02/19/2018 04:06 AM     Calcium 9.5 02/18/2018 05:15 PM    Calcium 9.9 02/04/2018 07:40 AM    Calcium 9.5 01/29/2018 11:49 AM    Bilirubin, total 0.4 02/19/2018 04:06 AM    Bilirubin, total 0.5 02/18/2018 05:15 PM    Bilirubin, total 0.6 01/29/2018 11:49 AM    Bilirubin, total 0.6 12/24/2017 07:24 PM    Bilirubin, total 0.6 12/12/2017 04:10 PM    AST (SGOT) 3 (L) 02/19/2018 04:06 AM    AST (SGOT) 5 (L) 02/18/2018 05:15 PM    AST (SGOT) <3 (L) 01/29/2018 11:49 AM    AST (SGOT) 12 (L) 12/24/2017 07:24 PM    AST (SGOT) 7 (L) 12/12/2017 04:10 PM    Alk. phosphatase 49 02/19/2018 04:06 AM    Alk. phosphatase 58 02/18/2018 05:15 PM    Alk. phosphatase 78 01/29/2018 11:49 AM    Alk. phosphatase 71 12/24/2017 07:24 PM    Alk. phosphatase 83 12/12/2017 04:10 PM    Protein, total 6.8 02/19/2018 04:06 AM    Protein, total 8.3 (H) 02/18/2018 05:15 PM    Protein, total 8.3 (H) 01/29/2018 11:49 AM    Protein, total 7.4 12/24/2017 07:24 PM    Protein, total 8.5 (H) 12/12/2017 04:10 PM    Albumin 3.1 (L) 02/19/2018 04:06 AM    Albumin 3.7 02/18/2018 05:15 PM    Albumin 3.6 01/29/2018 11:49 AM    Albumin 3.7 12/24/2017 07:24 PM    Albumin 3.9 12/12/2017 04:10 PM    Globulin 4.9 (H) 06/10/2016 01:00 PM    Globulin 5.1 (H) 01/16/2015 12:34 PM    Globulin 4.7 (H) 12/24/2014 12:57 PM    A-G Ratio 0.8 06/10/2016 01:00 PM    A-G Ratio 0.7 (L) 01/16/2015 12:34 PM    A-G Ratio 0.9 12/24/2014 12:57 PM    ALT (SGPT) 15 02/19/2018 04:06 AM    ALT (SGPT) 20 02/18/2018 05:15 PM    ALT (SGPT) 12 01/29/2018 11:49 AM    ALT (SGPT) 15 12/24/2017 07:24 PM    ALT (SGPT) 16 12/12/2017 04:10 PM       Lab Results   Component Value Date/Time    INR 1.1 09/08/2017 09:30 AM    INR 1.1 08/07/2017 05:21 AM    INR 1.1 06/16/2017 06:20 AM    Prothrombin time 13.2 (H) 09/08/2017 09:30 AM    Prothrombin time 12.9 08/07/2017 05:21 AM    Prothrombin time 12.8 06/16/2017 06:20 AM       No results found.    Elmer Bales, MD  04/23/2018  6:53 AM

## 2018-04-23 NOTE — Op Note (Signed)
[facility]  Operation Report  NAME:  [name] [name], [name] [name]  SEX:   F  DATE: 04/23/2018  DOB: [birth year]  MR#    [id number]  ROOM:  PL  ACCT#  [id number]        PREOPERATIVE DIAGNOSIS:  Ventral hernia.    POSTOPERATIVE DIAGNOSIS:  Umbilical hernia, and ventral hernia.    PROCEDURE PERFORMED:  Laparoscopic ventral hernia repair with mesh, laparoscopic umbilical hernia repair.    SURGEON:  Waynetta Sandy, MD    ANESTHESIA:  General anesthesia was used.    ESTIMATED BLOOD LOSS:  3 mL.    INDICATION FOR PROCEDURE:  A 67 year old female with a ventral hernia that has been causing her pain and discomfort.  The patient now for repair.    DESCRIPTION OF PROCEDURE:  The patient was taken to the operating room and identified.  The patient was placed in supine position.  The patient was placed under general anesthesia.  The patient was then prepped and draped in normal standard fashion.  At this time, a 5 mm trocar was then placed in the left lower quadrant with the scope behind it without difficulty.  Once this had been placed, CO2 insufflation was then performed.  After CO2 insufflation was then performed, a second trocar was then placed in the left upper quadrant, which was a 10/12 trocar.  At this time, evaluation revealed the obvious epic ventral hernia in the epigastric region with omentum stuck into it.  This was carefully teased down using a grasper and then sharp dissection was then performed to take down part of the falciform, which was right above this.  This was done with electrocautery.  Once hemostasis was assured, then a stab site was made just above the hernia with #11 blade and a Elizebeth Kluesner-Thomason was then used to place a #2 Vicryl tie.  This closed the fascial defect.  At this time, there was a second hernia that was identified just behind the umbilicus itself.  Once again, the preperitoneal fat was taken down using the grasper with electrocautery and  then a stab site was made on the skin and a #2 Vicryl tie was then placed with the Dowell Hoon-Thompson to close this fascial defect.  This was a very small hole.  After this was done, then a mesh was used.  This mesh was a Ventralight ST echo 10 x 15 cm by Bard Davol.  This was inserted through the left upper quadrant port.  The portion of the loop was then brought through where the hernia was through the skin with a Jeslyn Amsler-Thomason, the loop was then cut and the insufflating device was then attached and the balloon was insufflated, thus laying the mesh flat.  At this time, a SecureStrap was then used to anchor the mesh down without difficulty, covering where the hernia had been completely.  The balloon portion was then removed from the mesh without difficulty.  At this time, there was no sign of any bleeding.  The mesh was covering the ventral hernia.  The umbilical hernia was small and was closed just with a #2 Vicryl tie.  There was no sign of bleeding.  The patient, at this time, had trocars removed.  The left upper quadrant was closed with a #2 Vicryl tie to close the fascial defect and then 0.5% Marcaine was used to anesthetize both areas and the wounds were closed with 4-0 Monocryl suture.  The patient tolerated procedure well.      ___________________  Maureena Dabbs Alessandra Grout  MD  Dictated By:.   SB  D:04/24/2018 16:10:96  T: 04/24/2018 04:54:09  [id number]

## 2018-04-23 NOTE — Other (Signed)
Dr. Pecsok made aware of O2 sats decreasing to 88% on room air and maintaining between 90-93% with encouragement of IS; Pt BP at pre-op status as well and cleared for phase two status.

## 2018-04-23 NOTE — Brief Op Note (Signed)
BRIEF OPERATIVE NOTE    Date of Procedure: 04/23/2018   Preoperative Diagnosis: (K43.2); VENTRAL HERNIA  Postoperative Diagnosis: (K43.2); VENTRAL HERNIA    Procedure(s):  LAPAROSCOPIC UMBILICAL HERNIA REPAIR AND LAPAROSCOPIC VENTRA HERNIAL REPAIR WITH MESH  Surgeon(s) and Role:     * Adrena Nakamura L, MD - Primary         Surgical Assistant: none      Surgical Staff:  Circ-1: Mutegi, Antony; Okoh, Onyinyechi C  Scrub Tech-1: Taglis, Konstantina  Surg Asst-1: Doyle, Kelsey E  Event Time In Time Out   Incision Start 0808    Incision Close 0841      Anesthesia: General   Estimated Blood Loss: 3  Specimens: * No specimens in log *   Findings: small umbilical hernia and larger ventral hernia   Complications: none  Implants:   Implant Name Type Inv. Item Serial No. Manufacturer Lot No. LRB No. Used Action   MESH VENTRALIGHT ST ECHO 10X15CM - [id number] Mesh MESH VENTRALIGHT ST ECHO 10X15CM  C R BARD DAVOL [id number] N/A 1 Implanted

## 2018-04-23 NOTE — Anesthesia Post-Procedure Evaluation (Signed)
Procedure(s):  LAPAROSCOPIC UMBILICAL HERNIA REPAIR AND LAPAROSCOPIC VENTRA HERNIAL REPAIR WITH MESH.    general    Anesthesia Post Evaluation      Multimodal analgesia: multimodal analgesia used between 6 hours prior to anesthesia start to PACU discharge  Patient location during evaluation: PACU  Patient participation: complete - patient participated  Level of consciousness: awake and alert  Pain management: adequate  Airway patency: patent  Anesthetic complications: no  Cardiovascular status: acceptable  Respiratory status: acceptable  Hydration status: acceptable  Post anesthesia nausea and vomiting:  none      Vitals Value Taken Time   BP 146/96 04/23/2018  9:45 AM   Temp 36.5 ??C (97.7 ??F) 04/23/2018  9:02 AM   Pulse 81 04/23/2018  9:52 AM   Resp 11 04/23/2018  9:52 AM   SpO2 88 % 04/23/2018  9:52 AM   Vitals shown include unvalidated device data.

## 2018-04-23 NOTE — H&P (Signed)
History and Physical      Patient: [name] [name]               Sex: female          DOA: 04/23/2018         Date of Birth:  1950-09-13      Age:  67 y.o.        LOS: 0      MRN: [id number]  SSN:  [id number]       HPI:     [name] [name] is a 67 y.o. female who presents with a ventral hernia. Patient has had several episodes of severe pain    Past Medical History:   Diagnosis Date   ??? Arthritis    ??? 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 COPD (chronic obstructive pulmonary disease) ([place])    ??? DM2 (diabetes mellitus, type 2) ([place])    ??? GERD (gastroesophageal reflux disease)    ??? Hepatitis C     HEP C   ??? HTN (hypertension)    ??? Lung nodule, multiple     (CT 10/2016) Small left upper lobe and 1.7 x 1.6 cm left lower lobe nodules not significantly changed from 07/23/2016 and 03/22/2016   ??? Marijuana use    ??? PTSD (post-traumatic stress disorder)     lived through the [place] in 1993   ??? Thoracic ascending aortic aneurysm ([place])     4.4cm noted on CT Chest (10/2016)   ??? Thyroid nodule     2.5 cm stable right thyroid nodule (CT 10/2016)       Past Surgical History:   Procedure Laterality Date   ??? HX ENDOSCOPY     ??? HX HIP REPLACEMENT Right 01/29/2015   ??? HX HIP REPLACEMENT     ??? HX HYSTERECTOMY  2010    hysterectomy       Family History   Problem Relation Age of Onset   ??? Hypertension Mother    ??? Other Mother         OSA   ??? Cancer Father         suspected leukemia per pt's spouse   ??? Cancer Maternal Aunt    ??? Alcohol abuse Maternal Grandfather        Social History     Socioeconomic History   ??? Marital status: MARRIED     Spouse name: Not on file   ??? Number of children: Not on file   ??? Years of education: Not on file   ??? Highest education level: Not on file   Tobacco Use   ??? Smoking status: Former Smoker   ??? Smokeless tobacco: Never Used   ??? Tobacco comment: reported as quit smoking around 1990s per spouse   Substance and Sexual Activity   ??? Alcohol use: No   ??? Drug use: Yes     Types:  Marijuana     Comment: PTSD   ??? Sexual activity: Never       Prior to Admission medications    Medication Sig Start Date End Date Taking? Authorizing Provider   nabumetone (RELAFEN) 750 mg tablet TAKE 1 TABLET BY MOUTH TWICE DAILY AS NEEDED FOR PAIN 04/06/18  Yes Wallene Huh, PA-C   metFORMIN ER (GLUCOPHAGE XR) 500 mg tablet TAKE 1 TABLET TWICE DAILY 03/30/18  Yes Fransisco Beau, Monique, PA-C   montelukast (SINGULAIR) 10 mg tablet TAKE 1 TABLET EVERY DAY  03/26/18  Yes Brock, Monique, PA-C   albuterol (ACCUNEB) 1.25 mg/3 mL nebu INHALE CONTENTS OF 1 VIAL(3 ML) BY NEBULIZATION ROUTE EVERY 6 HOURS AS NEEDED FOR SHORTNESS OF BREATH, WHEEZING 03/26/18  Yes Fransisco Beau, Monique, PA-C   gabapentin (NEURONTIN) 300 mg capsule Take 1 Cap by mouth three (3) times daily. Max Daily Amount: 900 mg. 03/01/18  Yes Fransisco Beau, Monique, PA-C   polyethylene glycol (MIRALAX) 17 gram packet Take 1 Packet by mouth daily. 02/24/18  Yes Mehmood, Khalid, MD   pantoprazole (PROTONIX) 40 mg tablet Take 1 Tab by mouth Daily (before breakfast). 02/25/18  Yes Mehmood, Khalid, MD   senna-docusate (PERICOLACE) 8.6-50 mg per tablet Take 1 Tab by mouth daily. 02/24/18  Yes Mehmood, Khalid, MD   cloNIDine HCl (CATAPRES) 0.1 mg tablet Take 3 Tabs by mouth two (2) times a day. Take bid 02/24/18  Yes Mehmood, Khalid, MD   losartan (COZAAR) 25 mg tablet Take 1 Tab by mouth daily.  Patient taking differently: Take 12.5 mg by mouth daily. 02/24/18  Yes Mehmood, Khalid, MD   magnesium oxide (MAG-OX) 400 mg tablet Take 400 mg by mouth daily.   Yes Provider, Historical   QUEtiapine SR (SEROQUEL XR) 150 mg sr tablet Take 150 mg by mouth nightly.   Yes Provider, Historical   insulin glargine (LANTUS SOLOSTAR U-100 INSULIN) 100 unit/mL (3 mL) inpn 32 Units by SubCUTAneous route nightly. 01/19/18  Yes Wallene Huh, PA-C   Insulin Needles, Disposable, (NANO PEN NEEDLE) 32 gauge x 5/32" ndle To use with Lantus pen 01/03/18  Yes Brock, Monique, PA-C   dilTIAZem CD (CARDIZEM CD) 180 mg ER  capsule Take 1 Cap by mouth daily. 01/02/18  Yes Wallene Huh, PA-C   spironolactone (ALDACTONE) 100 mg tablet TAKE 1 TABLET BY MOUTH DAILY 01/02/18  Yes Fransisco Beau, Monique, PA-C   ondansetron (ZOFRAN ODT) 4 mg disintegrating tablet Take 1 Tab by mouth every eight (8) hours as needed for Nausea. Indications: nausea 12/24/17  Yes Hilton Sinclair, PA-C   nabumetone (RELAFEN) 750 mg tablet Take 750 mg by mouth two (2) times a day.   Yes Provider, Historical   albuterol-ipratropium (DUO-NEB) 2.5 mg-0.5 mg/3 ml nebu 3 mL by Nebulization route every six (6) hours as needed.   Yes Provider, Historical   Blood Glucose Control High&Low (ACCU-CHEK AVIVA CONTROL SOLN) soln Check blood sugar daily 11/23/17  Yes Fransisco Beau, Monique, PA-C   Blood-Glucose Meter (ACCU-CHEK AVIVA PLUS METER) misc Check blood sugar daily 11/23/17  Yes Fransisco Beau, Monique, PA-C   glucose blood VI test strips (ACCU-CHEK AVIVA PLUS TEST STRP) strip Check blood sugar daily 11/23/17  Yes Fransisco Beau, Monique, PA-C   alcohol swabs (BD SINGLE USE SWABS REGULAR) padm Check blood sugar daily 11/23/17  Yes Fransisco Beau, Monique, PA-C   lancets (ACCU-CHEK SOFTCLIX LANCETS) misc Check blood sugar daily 11/23/17  Yes Brock, Monique, PA-C   tiotropium bromide (SPIRIVA RESPIMAT) 2.5 mcg/actuation inhaler Take 1 Puff by inhalation daily. 10/11/17  Yes Holladay, Lorra Hals, MD   fluticasone propion-salmeterol (ADVAIR DISKUS) 100-50 mcg/dose diskus inhaler Take 1 Puff by inhalation two (2) times a day. 10/11/17  Yes Holladay, Lorra Hals, MD   traZODone (DESYREL) 50 mg tablet Take 1 Tab by mouth nightly as needed for Sleep. 08/09/17  Yes Einar Grad, MD   rosuvastatin (CRESTOR) 5 mg tablet TAKE 1 TABLET BY MOUTH NIGHTLY 07/13/17  Yes Lake Bells, MD   triamcinolone (NASACORT) 55 mcg nasal inhaler 2 Sprays by Both Nostrils route daily. 02/22/17  Yes Wallene Huh, PA-C   Blood-Glucose Meter monitoring kit Check sugars 2 times per day, fasting and 2 hours after dinner 07/15/15  Yes Lake Bells, MD    Lancets misc Check sugars twice daily 07/15/15  Yes Tharakan, Jolson K, MD   glucose blood VI test strips (ASCENSIA AUTODISC VI, ONE TOUCH ULTRA TEST VI) strip Check sugars twice daily 07/15/15  Yes Lake Bells, MD       Allergies   Allergen Reactions   ??? Chocolate [Cocoa] Sneezing     Not an allergy       Review of Systems   CONSTITUTIONAL: No Fever, No chills, No weight loss, No Night sweats  HEENT: No nasal discharge, No PN drainage, No epistaxis, No diff in swallowing  CVS: No chest pain, No palpitations, No syncope, No peripheral edema, No PND, No orthopnea  RS: No shortness of breath, No cough, No hemoptysis, No pleuritic chest pain  GI: No abd pain, No vomitting, No diarrhea, No hematemesis, No rectal bleeding, No acid reflux or heartburn abdominal pain  NEURO: No focal weakness, No headaches, No seizures  PSYCH: No anxiety, No depression  MUSCULOSKLETAL: No joint pain or swelling  GU: No hematuria or dysuria  SKIN: No rash  SLEEP: No snoring, No witnessed apneas, No daytime somnolence or tiredness      Physical Exam:     Visit Vitals  BP (!) 133/91 (BP 1 Location: Left arm, BP Patient Position: At rest;Sitting)   Pulse 92   Temp 98.9 ??F (37.2 ??C)   Resp 24   Ht '5\' 8"'$  (1.727 m)   Wt 86.4 kg (190 lb 5.9 oz)   SpO2 98%   BMI 28.95 kg/m??    Blood pressure (!) 133/91, pulse 92, temperature 98.9 ??F (37.2 ??C), resp. rate 24, height '5\' 8"'$  (1.727 m), weight 86.4 kg (190 lb 5.9 oz), SpO2 98 %.  No intake or output data in the 24 hours ending 04/23/18 0653      Physical Exam:     HEENT: Eyes, Nose, Oral  NECK: No lymphadenopthy or thyroid swelling, JVD not seen  LYMPH: No supraclavicular or cervical or axillary nodes on both sides  CVS: S1S2, No murmurs, P2 not prominent, No gallop or rub  RS: Bilateral Air Entry moderate, No wheezing or crackles  Abd: Soft, non tender, not distended, No guarding, No rigidity, BS normal ventral hernia  NEURO:  Non focal, normal strength.  Unable to examine since patient intubated  and sedated.  Extrm: no leg edema or leg swelling or clubbing bilateral  Skin: No rash      Assessment and Plan     Ventral hernia  Plan for laparoscopic ventral hernia repair with mesh  Patient understands risks of bleeding infection and recurrence and wishes to proceed  Hospital Problems:     Hospital Problems  Date Reviewed: 04/22/2018    None          Labs Reviewed:    CBC w/Diff    Lab Results   Component Value Date/Time    WBC 19.4 (H) 02/20/2018 03:35 AM    RBC 4.01 02/20/2018 03:35 AM    HCT 35.5 (L) 02/20/2018 03:35 AM    MCV 88.5 02/20/2018 03:35 AM    MCH 28.7 02/20/2018 03:35 AM    MCHC 32.4 02/20/2018 03:35 AM    RDW 14.6 (H) 09/21/2017 12:00 PM    Lab Results   Component Value Date/Time    MONOS 4.3 02/20/2018 03:35  AM    EOS 0.0 02/20/2018 03:35 AM    BASOS 0.1 02/20/2018 03:35 AM         Lab Results   Component Value Date/Time    WBC 19.4 (H) 02/20/2018 03:35 AM    WBC 17.0 (H) 02/19/2018 04:06 AM    WBC 18.9 (H) 02/18/2018 05:15 PM    WBC 11.5 (H) 02/04/2018 07:40 AM    WBC 12.4 (H) 01/29/2018 11:49 AM    HGB 11.5 (L) 02/20/2018 03:35 AM    HGB 12.3 (L) 02/19/2018 04:06 AM    HGB 14.7 02/18/2018 05:15 PM    HGB 13.4 02/04/2018 07:40 AM    HGB 14.6 01/29/2018 12:50 PM    Hemoglobin (POC) 11.8 12/16/2014 02:13 PM    HCT 35.5 (L) 02/20/2018 03:35 AM    PLATELET 241 02/20/2018 03:35 AM    PLATELET 249 02/19/2018 04:06 AM    PLATELET 336 02/18/2018 05:15 PM    PLATELET 329 02/04/2018 07:40 AM    PLATELET 254 01/29/2018 11:49 AM    MCV 88.5 02/20/2018 03:35 AM       Lab Results   Component Value Date/Time    Sodium 135 (L) 02/20/2018 03:35 AM    Sodium 134 (L) 02/19/2018 04:06 AM    Sodium 136 02/18/2018 05:15 PM    Sodium 135 (L) 02/04/2018 07:40 AM    Sodium 140 01/29/2018 12:50 PM    Potassium 5.1 02/20/2018 03:35 AM    Potassium 4.7 02/19/2018 04:06 AM    Potassium 4.7 02/18/2018 05:15 PM    Potassium 4.0 02/04/2018 07:40 AM    Potassium 4.2 01/29/2018 12:50 PM    Chloride 104 02/20/2018 03:35 AM     Chloride 102 02/19/2018 04:06 AM    Chloride 102 02/18/2018 05:15 PM    Chloride 98 02/04/2018 07:40 AM    Chloride 101 01/29/2018 12:50 PM    CO2 25 02/20/2018 03:35 AM    CO2 22 02/19/2018 04:06 AM    CO2 24 02/18/2018 05:15 PM    CO2 26 02/04/2018 07:40 AM    CO2 28 01/29/2018 11:49 AM    CO2, TOTAL 24.0 02/18/2018 06:03 PM    CO2, TOTAL 27 01/29/2018 12:50 PM    Anion gap 6 02/20/2018 03:35 AM    Anion gap 10 02/19/2018 04:06 AM    Anion gap 10 02/18/2018 05:15 PM    Anion gap 11 02/04/2018 07:40 AM    Anion gap 8 01/29/2018 11:49 AM    Glucose 243 (H) 02/20/2018 03:35 AM    Glucose 330 (H) 02/19/2018 04:06 AM    Glucose 178 (H) 02/18/2018 05:15 PM    Glucose 125 (H) 02/04/2018 07:40 AM    Glucose 109 (H) 01/29/2018 12:50 PM    BUN 38 (H) 02/20/2018 03:35 AM    BUN 37 (H) 02/19/2018 04:06 AM    BUN 31 (H) 02/18/2018 05:15 PM    BUN 19 02/04/2018 07:40 AM    BUN 17 01/29/2018 12:50 PM    Creatinine 1.0 02/20/2018 03:35 AM    Creatinine 1.2 02/19/2018 04:06 AM    Creatinine 1.3 02/18/2018 05:15 PM    Creatinine 1.2 02/04/2018 07:40 AM    Creatinine 1.0 01/29/2018 12:50 PM    BUN/Creatinine ratio 10 (L) 09/20/2017 04:05 PM    BUN/Creatinine ratio 24 (H) 06/10/2016 01:00 PM    BUN/Creatinine ratio 19 08/21/2015 01:40 PM    BUN/Creatinine ratio 21 (H) 01/31/2015 04:30 AM    BUN/Creatinine ratio 24 (H) 01/30/2015  03:30 AM    GFR est AA >60.0 02/20/2018 03:35 AM    GFR est AA 58.0 02/19/2018 04:06 AM    GFR est AA 53.0 02/18/2018 05:15 PM    GFR est AA 58.0 02/04/2018 07:40 AM    GFR est AA >60.0 01/29/2018 11:49 AM    GFR est non-AA 59 02/20/2018 03:35 AM    GFR est non-AA 48 02/19/2018 04:06 AM    GFR est non-AA 44 02/18/2018 05:15 PM    GFR est non-AA 48 02/04/2018 07:40 AM    GFR est non-AA 59 01/29/2018 11:49 AM    Calcium 8.5 02/20/2018 03:35 AM    Calcium 8.3 (L) 02/19/2018 04:06 AM    Calcium 9.5 02/18/2018 05:15 PM    Calcium 9.9 02/04/2018 07:40 AM    Calcium 9.5 01/29/2018 11:49 AM    Bilirubin, total 0.4  02/19/2018 04:06 AM    Bilirubin, total 0.5 02/18/2018 05:15 PM    Bilirubin, total 0.6 01/29/2018 11:49 AM    Bilirubin, total 0.6 12/24/2017 07:24 PM    Bilirubin, total 0.6 12/12/2017 04:10 PM    AST (SGOT) 3 (L) 02/19/2018 04:06 AM    AST (SGOT) 5 (L) 02/18/2018 05:15 PM    AST (SGOT) <3 (L) 01/29/2018 11:49 AM    AST (SGOT) 12 (L) 12/24/2017 07:24 PM    AST (SGOT) 7 (L) 12/12/2017 04:10 PM    Alk. phosphatase 49 02/19/2018 04:06 AM    Alk. phosphatase 58 02/18/2018 05:15 PM    Alk. phosphatase 78 01/29/2018 11:49 AM    Alk. phosphatase 71 12/24/2017 07:24 PM    Alk. phosphatase 83 12/12/2017 04:10 PM    Protein, total 6.8 02/19/2018 04:06 AM    Protein, total 8.3 (H) 02/18/2018 05:15 PM    Protein, total 8.3 (H) 01/29/2018 11:49 AM    Protein, total 7.4 12/24/2017 07:24 PM    Protein, total 8.5 (H) 12/12/2017 04:10 PM    Albumin 3.1 (L) 02/19/2018 04:06 AM    Albumin 3.7 02/18/2018 05:15 PM    Albumin 3.6 01/29/2018 11:49 AM    Albumin 3.7 12/24/2017 07:24 PM    Albumin 3.9 12/12/2017 04:10 PM    Globulin 4.9 (H) 06/10/2016 01:00 PM    Globulin 5.1 (H) 01/16/2015 12:34 PM    Globulin 4.7 (H) 12/24/2014 12:57 PM    A-G Ratio 0.8 06/10/2016 01:00 PM    A-G Ratio 0.7 (L) 01/16/2015 12:34 PM    A-G Ratio 0.9 12/24/2014 12:57 PM    ALT (SGPT) 15 02/19/2018 04:06 AM    ALT (SGPT) 20 02/18/2018 05:15 PM    ALT (SGPT) 12 01/29/2018 11:49 AM    ALT (SGPT) 15 12/24/2017 07:24 PM    ALT (SGPT) 16 12/12/2017 04:10 PM       Lab Results   Component Value Date/Time    INR 1.1 09/08/2017 09:30 AM    INR 1.1 08/07/2017 05:21 AM    INR 1.1 06/16/2017 06:20 AM    Prothrombin time 13.2 (H) 09/08/2017 09:30 AM    Prothrombin time 12.9 08/07/2017 05:21 AM    Prothrombin time 12.8 06/16/2017 06:20 AM       No results found.    Elmer Bales, MD  04/23/2018  6:53 AM

## 2018-04-23 NOTE — Interval H&P Note (Signed)
Dr. Jackson Latino made aware of O2 sats decreasing to 88% on room air and maintaining between 90-93% with encouragement of IS; Pt BP at pre-op status as well and cleared for phase two status.

## 2018-04-23 NOTE — Op Note (Signed)
BRIEF OPERATIVE NOTE    Date of Procedure: 04/23/2018   Preoperative Diagnosis: (K43.2); VENTRAL HERNIA  Postoperative Diagnosis: (K43.2); VENTRAL HERNIA    Procedure(s):  LAPAROSCOPIC UMBILICAL HERNIA REPAIR AND LAPAROSCOPIC VENTRA HERNIAL REPAIR WITH MESH  Surgeon(s) and Role:     Philis Pique, MD - Primary         Surgical Assistant: none      Surgical Staff:  Circ-1: Angelina Pih; Tilford Pillar, Onyinyechi C  Scrub Tech-1: Haskell Flirt  Surg Asst-1: Karrie Meres E  Event Time In Time Out   Incision Start [phone]    Incision Close 0841      Anesthesia: General   Estimated Blood Loss: 3  Specimens: * No specimens in log *   Findings: small umbilical hernia and larger ventral hernia   Complications: none  Implants:   Implant Name Type Inv. Item Serial No. Manufacturer Lot No. LRB No. Used Action   MESH VENTRALIGHT ST ECHO 10X15CM - [id number] Mesh MESH VENTRALIGHT ST ECHO 10X15CM  C R BARD DAVOL [id number] N/A 1 Implanted

## 2018-04-23 NOTE — Op Note (Signed)
[facility]  Operation Report  NAME:  [name] [name]  SEX:   F  DATE: 04/23/2018  DOB: 1951/05/07  MR#    [id number]  ROOM:  PL  ACCT#  [id number]        PREOPERATIVE DIAGNOSIS:  Ventral hernia.    POSTOPERATIVE DIAGNOSIS:  Umbilical hernia, and ventral hernia.    PROCEDURE PERFORMED:  Laparoscopic ventral hernia repair with mesh, laparoscopic umbilical hernia repair.    SURGEON:  Waynetta Sandy, MD    ANESTHESIA:  General anesthesia was used.    ESTIMATED BLOOD LOSS:  3 mL.    INDICATION FOR PROCEDURE:  A 67 year old female with a ventral hernia that has been causing her pain and discomfort.  The patient now for repair.    DESCRIPTION OF PROCEDURE:  The patient was taken to the operating room and identified.  The patient was placed in supine position.  The patient was placed under general anesthesia.  The patient was then prepped and draped in normal standard fashion.  At this time, a 5 mm trocar was then placed in the left lower quadrant with the scope behind it without difficulty.  Once this had been placed, CO2 insufflation was then performed.  After CO2 insufflation was then performed, a second trocar was then placed in the left upper quadrant, which was a 10/12 trocar.  At this time, evaluation revealed the obvious epic ventral hernia in the epigastric region with omentum stuck into it.  This was carefully teased down using a grasper and then sharp dissection was then performed to take down part of the falciform, which was right above this.  This was done with electrocautery.  Once hemostasis was assured, then a stab site was made just above the hernia with #11 blade and a Phong Isenberg-Thomason was then used to place a #2 Vicryl tie.  This closed the fascial defect.  At this time, there was a second hernia that was identified just behind the umbilicus itself.  Once again, the preperitoneal fat was taken down using the grasper with electrocautery and then a stab site was made on the skin and a #2 Vicryl  tie was then placed with the Aarin Sparkman-Thompson to close this fascial defect.  This was a very small hole.  After this was done, then a mesh was used.  This mesh was a Ventralight ST echo 10 x 15 cm by Bard Davol.  This was inserted through the left upper quadrant port.  The portion of the loop was then brought through where the hernia was through the skin with a Shalay Carder-Thomason, the loop was then cut and the insufflating device was then attached and the balloon was insufflated, thus laying the mesh flat.  At this time, a SecureStrap was then used to anchor the mesh down without difficulty, covering where the hernia had been completely.  The balloon portion was then removed from the mesh without difficulty.  At this time, there was no sign of any bleeding.  The mesh was covering the ventral hernia.  The umbilical hernia was small and was closed just with a #2 Vicryl tie.  There was no sign of bleeding.  The patient, at this time, had trocars removed.  The left upper quadrant was closed with a #2 Vicryl tie to close the fascial defect and then 0.5% Marcaine was used to anesthetize both areas and the wounds were closed with 4-0 Monocryl suture.  The patient tolerated procedure well.      ___________________  Kailynn Satterly Alessandra Grout  MD  Dictated By:.   SB  D:04/24/2018 16:10:96  T: 04/24/2018 04:54:09  [id number]

## 2018-04-24 MED ORDER — NABUMETONE 750 MG TAB
750 mg | ORAL_TABLET | ORAL | 0 refills | Status: DC
Start: 2018-04-24 — End: 2018-08-05

## 2018-04-24 MED ORDER — ALBUTEROL SULFATE 1.25 MG/3 ML (0.042 %) SOLN FOR INHALATION
1.25 mg/3 mL | RESPIRATORY_TRACT | 0 refills | Status: DC
Start: 2018-04-24 — End: 2018-05-28

## 2018-04-24 MED FILL — PROPOFOL 10 MG/ML IV EMUL: 10 mg/mL | INTRAVENOUS | Qty: 20

## 2018-04-24 MED FILL — XYLOCAINE-MPF 20 MG/ML (2 %) INJECTION SOLUTION: 20 mg/mL (2 %) | INTRAMUSCULAR | Qty: 50

## 2018-04-24 MED FILL — KETOROLAC TROMETHAMINE 30 MG/ML INJECTION: 30 mg/mL (1 mL) | INTRAMUSCULAR | Qty: 1

## 2018-04-24 MED FILL — ROCURONIUM 10 MG/ML IV: 10 mg/mL | INTRAVENOUS | Qty: 5

## 2018-04-24 MED FILL — EPHEDRINE SULFATE 50 MG/ML INTRAVENOUS SOLUTION: 50 mg/mL | INTRAVENOUS | Qty: 15

## 2018-04-24 MED FILL — BRIDION 100 MG/ML INTRAVENOUS SOLUTION: 100 mg/mL | INTRAVENOUS | Qty: 2

## 2018-04-24 MED FILL — LABETALOL 5 MG/ML IV SOLN: 5 mg/mL | INTRAVENOUS | Qty: 20

## 2018-04-24 MED FILL — PHENYLEPHRINE 10 MG/ML INJECTION: 10 mg/mL | INTRAMUSCULAR | Qty: 50

## 2018-04-24 MED FILL — GLYCOPYRROLATE 0.2 MG/ML IJ SOLN: 0.2 mg/mL | INTRAMUSCULAR | Qty: 1

## 2018-04-24 MED FILL — ONDANSETRON (PF) 4 MG/2 ML INJECTION: 4 mg/2 mL | INTRAMUSCULAR | Qty: 2

## 2018-04-24 MED FILL — DEXAMETHASONE SODIUM PHOSPHATE 4 MG/ML IJ SOLN: 4 mg/mL | INTRAMUSCULAR | Qty: 8

## 2018-04-25 ENCOUNTER — Encounter

## 2018-04-27 ENCOUNTER — Encounter

## 2018-04-27 MED ORDER — SPIRONOLACTONE 100 MG TAB
100 mg | ORAL_TABLET | ORAL | 0 refills | Status: DC
Start: 2018-04-27 — End: 2019-06-05

## 2018-05-09 NOTE — Telephone Encounter (Signed)
What have her BP's been running at home?

## 2018-05-11 ENCOUNTER — Emergency Department: Admit: 2018-05-11 | Payer: MEDICARE | Primary: Physician Assistant

## 2018-05-11 ENCOUNTER — Inpatient Hospital Stay
Admit: 2018-05-11 | Discharge: 2018-05-12 | Disposition: A | Discharge status: Home / Self Care | Payer: MEDICARE | Attending: Emergency Medicine

## 2018-05-11 DIAGNOSIS — R1031 Right lower quadrant pain: Secondary | ICD-10-CM

## 2018-05-11 LAB — CBC WITH AUTO DIFFERENTIAL
Basophils %: 0.5 % (ref 0–3)
Eosinophils %: 2.2 % (ref 0–5)
Hematocrit: 39.1 % (ref 37.0–50.0)
Hemoglobin: 12.8 gm/dl — ABNORMAL LOW (ref 13.0–17.2)
Immature Granulocytes: 0.4 % (ref 0.0–3.0)
Lymphocytes %: 22.4 % — ABNORMAL LOW (ref 28–48)
MCH: 29.2 pg (ref 25.4–34.6)
MCHC: 32.7 gm/dl (ref 30.0–36.0)
MCV: 89.1 fL (ref 80.0–98.0)
MPV: 11 fL — ABNORMAL HIGH (ref 6.0–10.0)
Monocytes %: 6.5 % (ref 1–13)
Neutrophils %: 68 % — ABNORMAL HIGH (ref 34–64)
Nucleated RBCs: 0 (ref 0–0)
Platelets: 294 10*3/uL (ref 140–450)
RBC: 4.39 M/uL (ref 3.60–5.20)
RDW-SD: 42.8 (ref 36.4–46.3)
WBC: 13 10*3/uL — ABNORMAL HIGH (ref 4.0–11.0)

## 2018-05-11 LAB — COMPREHENSIVE METABOLIC PANEL
ALT: 13 U/L (ref 12–78)
AST: 11 U/L — ABNORMAL LOW (ref 15–37)
Albumin: 3.3 gm/dl — ABNORMAL LOW (ref 3.4–5.0)
Alkaline Phosphatase: 82 U/L (ref 45–117)
Anion Gap: 7 mmol/L (ref 5–15)
BUN: 22 mg/dl (ref 7–25)
CO2: 30 mEq/L (ref 21–32)
Calcium: 9.6 mg/dl (ref 8.5–10.1)
Chloride: 100 mEq/L (ref 98–107)
Creatinine: 1.3 mg/dl (ref 0.6–1.3)
EGFR IF NonAfrican American: 44
GFR African American: 53
Glucose: 107 mg/dl — ABNORMAL HIGH (ref 74–106)
Potassium: 4.5 mEq/L (ref 3.5–5.1)
Sodium: 137 mEq/L (ref 136–145)
Total Bilirubin: 0.7 mg/dl (ref 0.2–1.0)
Total Protein: 7.9 gm/dl (ref 6.4–8.2)

## 2018-05-11 LAB — POC URINE MACROSCOPIC
Bilirubin, Urine: NEGATIVE
Bilirubin: NEGATIVE
Glucose, Ur: NEGATIVE mg/dl
Glucose: NEGATIVE mg/dl
Ketone: NEGATIVE mg/dl
Ketones, Urine: NEGATIVE mg/dl
Nitrite, Urine: NEGATIVE
Nitrites: NEGATIVE
Protein, UA: 30 mg/dl — AB
Protein: 30 mg/dl — AB
Specific Gravity, UA: 1.015 (ref 1.005–1.030)
Specific gravity: 1.015 (ref 1.005–1.030)
Urobilinogen, UA, POCT: 0.2 EU/dl (ref 0.0–1.0)
Urobilinogen: 0.2 EU/dl (ref 0.0–1.0)
pH (UA): 5.5 (ref 5–9)
pH, UA: 5.5 (ref 5–9)

## 2018-05-11 LAB — POC CHEM8
BUN: 27 mg/dl — ABNORMAL HIGH (ref 7–25)
BUN: 27 mg/dl — ABNORMAL HIGH (ref 7–25)
CALCIUM,IONIZED: 4.6 mg/dL (ref 4.40–5.40)
CALCIUM,IONIZED: 4.6 mg/dL (ref 4.40–5.40)
CO2 Total: 32 mmol/L (ref 21–32)
CO2, TOTAL: 32 mmol/L (ref 21–32)
Chloride: 97 mEq/L — ABNORMAL LOW (ref 98–107)
Chloride: 97 mEq/L — ABNORMAL LOW (ref 98–107)
Creatinine: 1.2 mg/dl (ref 0.6–1.3)
Creatinine: 1.2 mg/dl (ref 0.6–1.3)
Glucose: 108 mg/dL — ABNORMAL HIGH (ref 74–106)
Glucose: 108 mg/dL — ABNORMAL HIGH (ref 74–106)
HCT: 59 % — ABNORMAL HIGH (ref 38–45)
HGB: 20.1 gm/dl — CR (ref 12.4–17.2)
Hematocrit: 59 % — ABNORMAL HIGH (ref 38–45)
Hemoglobin: 20.1 gm/dl (ref 12.4–17.2)
Potassium: 4.6 mEq/L (ref 3.5–4.9)
Potassium: 4.6 mEq/L (ref 3.5–4.9)
Sodium: 138 mEq/L (ref 136–145)
Sodium: 138 mEq/L (ref 136–145)

## 2018-05-11 LAB — POC URINE MICROSCOPIC
EPITHELIAL CELLS, SQUAMOUS, EPITHSQ: >50 /LPF
Epithelial cells, squamous: >50 /LPF

## 2018-05-11 LAB — LIPASE
Lipase: 113 U/L (ref 73–393)
Lipase: 113 U/L (ref 73–393)

## 2018-05-11 LAB — METABOLIC PANEL, COMPREHENSIVE
ALT (SGPT): 13 U/L (ref 12–78)
AST (SGOT): 11 U/L — ABNORMAL LOW (ref 15–37)
Albumin: 3.3 gm/dl — ABNORMAL LOW (ref 3.4–5.0)
Alk. phosphatase: 82 U/L (ref 45–117)
Anion gap: 7 mmol/L (ref 5–15)
BUN: 22 mg/dl (ref 7–25)
Bilirubin, total: 0.7 mg/dl (ref 0.2–1.0)
CO2: 30 mEq/L (ref 21–32)
Calcium: 9.6 mg/dl (ref 8.5–10.1)
Chloride: 100 mEq/L (ref 98–107)
Creatinine: 1.3 mg/dl (ref 0.6–1.3)
GFR est AA: 53
GFR est non-AA: 44
Glucose: 107 mg/dl — ABNORMAL HIGH (ref 74–106)
Potassium: 4.5 mEq/L (ref 3.5–5.1)
Protein, total: 7.9 gm/dl (ref 6.4–8.2)
Sodium: 137 mEq/L (ref 136–145)

## 2018-05-11 LAB — CBC WITH AUTOMATED DIFF
BASOPHILS: 0.5 % (ref 0–3)
EOSINOPHILS: 2.2 % (ref 0–5)
HCT: 39.1 % (ref 37.0–50.0)
HGB: 12.8 gm/dl — ABNORMAL LOW (ref 13.0–17.2)
IMMATURE GRANULOCYTES: 0.4 % (ref 0.0–3.0)
LYMPHOCYTES: 22.4 % — ABNORMAL LOW (ref 28–48)
MCH: 29.2 pg (ref 25.4–34.6)
MCHC: 32.7 gm/dl (ref 30.0–36.0)
MCV: 89.1 fL (ref 80.0–98.0)
MONOCYTES: 6.5 % (ref 1–13)
MPV: 11 fL — ABNORMAL HIGH (ref 6.0–10.0)
NEUTROPHILS: 68 % — ABNORMAL HIGH (ref 34–64)
NRBC: 0 (ref 0–0)
PLATELET: 294 10*3/uL (ref 140–450)
RBC: 4.39 M/uL (ref 3.60–5.20)
RDW-SD: 42.8 (ref 36.4–46.3)
WBC: 13 10*3/uL — ABNORMAL HIGH (ref 4.0–11.0)

## 2018-05-11 MED ORDER — IOPAMIDOL 76 % IV SOLN
370 mg iodine /mL (76 %) | Freq: Once | INTRAVENOUS | Status: AC
Start: 2018-05-11 — End: 2018-05-11
  Administered 2018-05-11: 23:00:00 via INTRAVENOUS

## 2018-05-11 MED ORDER — ONDANSETRON (PF) 4 MG/2 ML INJECTION
4 mg/2 mL | Freq: Once | INTRAMUSCULAR | Status: AC
Start: 2018-05-11 — End: 2018-05-11
  Administered 2018-05-11: 22:00:00 via INTRAVENOUS

## 2018-05-11 MED ORDER — MORPHINE 4 MG/ML SYRINGE
4 mg/mL | INTRAMUSCULAR | Status: AC
Start: 2018-05-11 — End: 2018-05-11
  Administered 2018-05-11: 22:00:00 via INTRAVENOUS

## 2018-05-11 MED FILL — ISOVUE-370  76 % INTRAVENOUS SOLUTION: 370 mg iodine /mL (76 %) | INTRAVENOUS | Qty: 80

## 2018-05-11 MED FILL — ONDANSETRON (PF) 4 MG/2 ML INJECTION: 4 mg/2 mL | INTRAMUSCULAR | Qty: 2

## 2018-05-11 MED FILL — MORPHINE 4 MG/ML SYRINGE: 4 mg/mL | INTRAMUSCULAR | Qty: 2

## 2018-05-11 NOTE — ED Triage Notes (Signed)
Pt arrives to ED c/o burning in lower abdomen for the past couple days; pt states she had laparoscopic hernia repair surgery on 11/11 by Dr. Carter; pt has four incision sites, one at umbilicus, left of umbilicus, upper midline, and left upper quadrant; pt states she has taken Advil for pain with no relief

## 2018-05-11 NOTE — ED Provider Notes (Signed)
[facility]  Emergency Department Treatment Report        Patient: [name] [name] Age: 67 y.o. Sex: female    Date of Birth: Oct 29, 1950 Admit Date: 05/11/2018 PCP: Ernst Breach   MRN: [id number]  CSN: [id number]     Room: ER42/ER42 Time Dictated: 4:22 PM          Chief Complaint   Generalized abdominal pain    History of Present Illness   67 y.o. female status post right inguinal herniorrhaphy on 11/11 by Dr. Eulas Post, has had pain ever since and she is "tired of it".  She has not contacted Dr. Eulas Post about it.  Denies any nausea, vomiting, dizziness, syncope, melena, constipation, diarrhea, urinary symptoms, flank pain, fever, chills, or any other symptoms or injuries.    Review of Systems   ROS  Constitutional:  No fever, chills, or weight loss. Normal appetite  Eyes: No visual complaints.  ENT: No sore throat, runny nose or ear pain.  Heme/Lymph: No easy bruising or lymph node swelling  Respiratory: No cough, dyspnea or wheezing.  Cardiovascular: No chest pain, pressure, palpitations, tightness or heaviness.  Gastrointestinal: No vomiting, diarrhea.  No melena or bright red blood per rectum  Genitourinary: No dysuria, frequency, or urgency.  No hematuria  Musculoskeletal: No joint pain, swelling, or extremity pain.  Integumentary: No rashes or other skin lesions  Neurological: No headaches, sensory or motor symptoms.  Denies complaints in all other systems    Past Medical/Surgical History     Past Medical History:   Diagnosis Date   ??? Arthritis    ??? 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 COPD (chronic obstructive pulmonary disease) ([place])    ??? DM2 (diabetes mellitus, type 2) ([place])    ??? GERD (gastroesophageal reflux disease)    ??? Hepatitis C     HEP C   ??? HTN (hypertension)    ??? Lung nodule, multiple     (CT 10/2016) Small left upper lobe and 1.7 x 1.6 cm left lower lobe nodules not significantly changed from 07/23/2016 and 03/22/2016   ??? Marijuana use     ??? PTSD (post-traumatic stress disorder)     lived through the [place] in 1993   ??? Thoracic ascending aortic aneurysm ([place])     4.4cm noted on CT Chest (10/2016)   ??? Thyroid nodule     2.5 cm stable right thyroid nodule (CT 10/2016)     Past Surgical History:   Procedure Laterality Date   ??? HX ENDOSCOPY     ??? HX HIP REPLACEMENT Right 01/29/2015   ??? HX HIP REPLACEMENT     ??? HX HYSTERECTOMY  2010    hysterectomy       Social History     Social History     Socioeconomic History   ??? Marital status: MARRIED     Spouse name: Not on file   ??? Number of children: Not on file   ??? Years of education: Not on file   ??? Highest education level: Not on file   Occupational History   ??? Not on file   Social Needs   ??? Financial resource strain: Not on file   ??? Food insecurity:     Worry: Not on file     Inability: Not on file   ??? Transportation needs:     Medical: Not on file     Non-medical: Not  on file   Tobacco Use   ??? Smoking status: Former Smoker   ??? Smokeless tobacco: Never Used   ??? Tobacco comment: reported as quit smoking around 1990s per spouse   Substance and Sexual Activity   ??? Alcohol use: No   ??? Drug use: Yes     Types: Marijuana     Comment: PTSD; last use last week as of 05/11/18   ??? Sexual activity: Never   Lifestyle   ??? Physical activity:     Days per week: Not on file     Minutes per session: Not on file   ??? Stress: Not on file   Relationships   ??? Social connections:     Talks on phone: Not on file     Gets together: Not on file     Attends religious service: Not on file     Active member of club or organization: Not on file     Attends meetings of clubs or organizations: Not on file     Relationship status: Not on file   ??? Intimate partner violence:     Fear of current or ex partner: Not on file     Emotionally abused: Not on file     Physically abused: Not on file     Forced sexual activity: Not on file   Other Topics Concern   ??? Not on file   Social History Narrative   ??? Not on file        Family History     Family History   Problem Relation Age of Onset   ??? Hypertension Mother    ??? Other Mother         OSA   ??? Cancer Father         suspected leukemia per pt's spouse   ??? Cancer Maternal Aunt    ??? Alcohol abuse Maternal Grandfather        Current Medications     Prior to Admission Medications   Prescriptions Last Dose Informant Patient Reported? Taking?   Blood Glucose Control High&Low (ACCU-CHEK AVIVA CONTROL SOLN) soln   No No   Sig: Check blood sugar daily   Blood-Glucose Meter (ACCU-CHEK AVIVA PLUS METER) misc   No No   Sig: Check blood sugar daily   Blood-Glucose Meter monitoring kit   No No   Sig: Check sugars 2 times per day, fasting and 2 hours after dinner   Insulin Needles, Disposable, (NANO PEN NEEDLE) 32 gauge x 5/32" ndle   No No   Sig: To use with Lantus pen   Lancets misc   No No   Sig: Check sugars twice daily   QUEtiapine SR (SEROQUEL XR) 150 mg sr tablet   Yes No   Sig: Take 150 mg by mouth nightly.   albuterol (ACCUNEB) 1.25 mg/3 mL nebu   No No   Sig: INHALE CONTENTS OF 1 VIAL(3 ML) BY NEBULIZATION ROUTE EVERY 6 HOURS AS NEEDED FOR SHORTNESS OF BREATH, WHEEZING   albuterol-ipratropium (DUO-NEB) 2.5 mg-0.5 mg/3 ml nebu   Yes No   Sig: 3 mL by Nebulization route every six (6) hours as needed.   alcohol swabs (BD SINGLE USE SWABS REGULAR) padm   No No   Sig: Check blood sugar daily   cloNIDine HCl (CATAPRES) 0.1 mg tablet   No No   Sig: Take 3 Tabs by mouth two (2) times a day. Take bid   dilTIAZem CD (CARDIZEM CD) 180 mg ER  capsule   No No   Sig: Take 1 Cap by mouth daily.   fluticasone propion-salmeterol (ADVAIR DISKUS) 100-50 mcg/dose diskus inhaler   No No   Sig: Take 1 Puff by inhalation two (2) times a day.   gabapentin (NEURONTIN) 300 mg capsule   No No   Sig: Take 1 Cap by mouth three (3) times daily. Max Daily Amount: 900 mg.   glucose blood VI test strips (ACCU-CHEK AVIVA PLUS TEST STRP) strip   No No   Sig: Check blood sugar daily    glucose blood VI test strips (ASCENSIA AUTODISC VI, ONE TOUCH ULTRA TEST VI) strip   No No   Sig: Check sugars twice daily   insulin glargine (LANTUS SOLOSTAR U-100 INSULIN) 100 unit/mL (3 mL) inpn   No No   Sig: 32 Units by SubCUTAneous route nightly.   lancets (ACCU-CHEK SOFTCLIX LANCETS) misc   No No   Sig: Check blood sugar daily   losartan (COZAAR) 25 mg tablet   No No   Sig: Take 1 Tab by mouth daily.   Patient taking differently: Take 12.5 mg by mouth daily.   magnesium oxide (MAG-OX) 400 mg tablet   Yes No   Sig: Take 400 mg by mouth daily.   metFORMIN ER (GLUCOPHAGE XR) 500 mg tablet   No No   Sig: TAKE 1 TABLET TWICE DAILY   montelukast (SINGULAIR) 10 mg tablet   No No   Sig: TAKE 1 TABLET EVERY DAY   nabumetone (RELAFEN) 750 mg tablet   Yes No   Sig: Take 750 mg by mouth two (2) times a day.   nabumetone (RELAFEN) 750 mg tablet   No No   Sig: TAKE 1 TABLET BY MOUTH TWICE DAILY AS NEEDED FOR PAIN   ondansetron (ZOFRAN ODT) 4 mg disintegrating tablet   No No   Sig: Take 1 Tab by mouth every eight (8) hours as needed for Nausea. Indications: nausea   pantoprazole (PROTONIX) 40 mg tablet   No No   Sig: Take 1 Tab by mouth Daily (before breakfast).   polyethylene glycol (MIRALAX) 17 gram packet   No No   Sig: Take 1 Packet by mouth daily.   rosuvastatin (CRESTOR) 5 mg tablet   No No   Sig: TAKE 1 TABLET BY MOUTH NIGHTLY   senna-docusate (PERICOLACE) 8.6-50 mg per tablet   No No   Sig: Take 1 Tab by mouth daily.   spironolactone (ALDACTONE) 100 mg tablet   No No   Sig: TAKE 1 TABLET EVERY DAY   tiotropium bromide (SPIRIVA RESPIMAT) 2.5 mcg/actuation inhaler   No No   Sig: Take 1 Puff by inhalation daily.   traZODone (DESYREL) 50 mg tablet   No No   Sig: Take 1 Tab by mouth nightly as needed for Sleep.   triamcinolone (NASACORT) 55 mcg nasal inhaler   No No   Sig: 2 Sprays by Both Nostrils route daily.      Facility-Administered Medications: None       Allergies     Allergies   Allergen Reactions    ??? Chocolate [Cocoa] Sneezing     Not an allergy       Physical Exam     ED Triage Vitals   ED Encounter Vitals Group      BP 05/11/18 1525 (!) 125/91      Pulse (Heart Rate) 05/11/18 1525 93      Resp Rate 05/11/18 1525 18  Temp 05/11/18 1525 98.8 ??F (37.1 ??C)      Temp src --       O2 Sat (%) 05/11/18 1525 97 %      Weight 05/11/18 1541 185 lb      Height 05/11/18 1541 '5\' 8"'$      Physical Exam  Vitals signs and nursing note reviewed.   Constitutional:       Appearance: She is well-developed.   HENT:      Head: Normocephalic and atraumatic.   Eyes:      Extraocular Movements: Extraocular movements intact.   Cardiovascular:      Rate and Rhythm: Normal rate and regular rhythm.   Pulmonary:      Effort: Pulmonary effort is normal.      Breath sounds: Normal breath sounds.   Abdominal:      General: Bowel sounds are normal.      Palpations: Abdomen is soft. There is no mass.      Tenderness: There is generalized tenderness. There is no right CVA tenderness, left CVA tenderness or guarding.   Skin:     General: Skin is warm and dry.      Coloration: Skin is not jaundiced.   Neurological:      General: No focal deficit present.      Mental Status: She is alert.   Psychiatric:         Mood and Affect: Mood normal.         Behavior: Behavior normal.            Impression and Management Plan   The patient has had pain now for several weeks after hernia surgery, did not contact the surgeon about this, presents today because she is "tired of the pain" we will do a CT and labs.    Diagnostic Studies   Lab:   Recent Results (from the past 12 hour(s))   CBC WITH AUTOMATED DIFF    Collection Time: 05/11/18  4:08 PM   Result Value Ref Range    WBC 13.0 (H) 4.0 - 11.0 1000/mm3    RBC 4.39 3.60 - 5.20 M/uL    HGB 12.8 (L) 13.0 - 17.2 gm/dl    HCT 39.1 37.0 - 50.0 %    MCV 89.1 80.0 - 98.0 fL    MCH 29.2 25.4 - 34.6 pg    MCHC 32.7 30.0 - 36.0 gm/dl    PLATELET 294 140 - 450 1000/mm3    MPV 11.0 (H) 6.0 - 10.0 fL     RDW-SD 42.8 36.4 - 46.3      NRBC 0 0 - 0      IMMATURE GRANULOCYTES 0.4 0.0 - 3.0 %    NEUTROPHILS 68.0 (H) 34 - 64 %    LYMPHOCYTES 22.4 (L) 28 - 48 %    MONOCYTES 6.5 1 - 13 %    EOSINOPHILS 2.2 0 - 5 %    BASOPHILS 0.5 0 - 3 %   LIPASE    Collection Time: 05/11/18  4:08 PM   Result Value Ref Range    Lipase 113 73 - 505 U/L   METABOLIC PANEL, COMPREHENSIVE    Collection Time: 05/11/18  4:08 PM   Result Value Ref Range    Sodium 137 136 - 145 mEq/L    Potassium 4.5 3.5 - 5.1 mEq/L    Chloride 100 98 - 107 mEq/L    CO2 30 21 - 32 mEq/L    Glucose 107 (H) 74 -  106 mg/dl    BUN 22 7 - 25 mg/dl    Creatinine 1.3 0.6 - 1.3 mg/dl    GFR est AA 53.0      GFR est non-AA 44      Calcium 9.6 8.5 - 10.1 mg/dl    AST (SGOT) 11 (L) 15 - 37 U/L    ALT (SGPT) 13 12 - 78 U/L    Alk. phosphatase 82 45 - 117 U/L    Bilirubin, total 0.7 0.2 - 1.0 mg/dl    Protein, total 7.9 6.4 - 8.2 gm/dl    Albumin 3.3 (L) 3.4 - 5.0 gm/dl    Anion gap 7 5 - 15 mmol/L   POC CHEM8    Collection Time: 05/11/18  4:35 PM   Result Value Ref Range    Sodium 138 136 - 145 mEq/L    Potassium 4.6 3.5 - 4.9 mEq/L    Chloride 97 (L) 98 - 107 mEq/L    CO2, TOTAL 32 21 - 32 mmol/L    Glucose 108 (H) 74 - 106 mg/dL    BUN 27 (H) 7 - 25 mg/dl    Creatinine 1.2 0.6 - 1.3 mg/dl    HCT 59 (H) 38 - 45 %    HGB 20.1 (HH) 12.4 - 17.2 gm/dl    CALCIUM,IONIZED 4.60 4.40 - 5.40 mg/dL   POC URINE MACROSCOPIC    Collection Time: 05/11/18  4:53 PM   Result Value Ref Range    Glucose Negative NEGATIVE,Negative mg/dl    Bilirubin Negative NEGATIVE,Negative      Ketone Negative NEGATIVE,Negative mg/dl    Specific gravity 1.015 1.005 - 1.030      Blood Moderate (A) NEGATIVE,Negative      pH (UA) 5.5 5 - 9      Protein 30 (A) NEGATIVE,Negative mg/dl    Urobilinogen 0.2 0.0 - 1.0 EU/dl    Nitrites Negative NEGATIVE,Negative      Leukocyte Esterase Small (A) NEGATIVE,Negative      Color Yellow      Appearance Slightly Cloudy     POC URINE MICROSCOPIC     Collection Time: 05/11/18  4:53 PM   Result Value Ref Range    Epithelial cells, squamous >50 /LPF    WBC 5-9 /HPF    RBC OCCASIONAL /HPF    Bacteria 2+ /HPF     Labs Reviewed   CBC WITH AUTOMATED DIFF - Abnormal; Notable for the following components:       Result Value    WBC 13.0 (*)     HGB 12.8 (*)     MPV 11.0 (*)     NEUTROPHILS 68.0 (*)     LYMPHOCYTES 22.4 (*)     All other components within normal limits   METABOLIC PANEL, COMPREHENSIVE - Abnormal; Notable for the following components:    Glucose 107 (*)     AST (SGOT) 11 (*)     Albumin 3.3 (*)     All other components within normal limits   POC CHEM8 - Abnormal; Notable for the following components:    Chloride 97 (*)     Glucose 108 (*)     BUN 27 (*)     HCT 59 (*)     HGB 20.1 (*)     All other components within normal limits   POC URINE MACROSCOPIC - Abnormal; Notable for the following components:    Blood Moderate (*)     Protein 30 (*)  Leukocyte Esterase Small (*)     All other components within normal limits   LIPASE   POC URINE MICROSCOPIC       Imaging:    Ct Abd Pelv W Cont    Result Date: 05/11/2018  INDICATION: Abdominal pain COMPARISON: CT dated 02/22/2018 TECHNIQUE: CT of the abdomen and pelvis WITH intravenous contrast. DICOM format image data is available to non-affiliated external healthcare facilities or entities on a secure, media free, reciprocally searchable basis with patient authorization for 12 months following the date of the study. Findings: Scattered emphysema, most pronounced within the right middle lobe. Left lung base subsegmental atelectasis or scarring. Liver, gallbladder, and pancreas grossly unremarkable. Mild thickening of the adrenal glands. Scattered indeterminant splenic hypodensities, measuring up to 1.6 cm. Findings may reflect small pseudocysts or hemangioma. Too small to characterize bilateral renal hypodensities and a 1 cm right renal cyst. Nonobstructing 2 mm right renal calculus. Extensive sigmoid colonic  diverticulosis. There is suggestion of very subtle inflammatory change surrounding a short segment of the sigmoid colon, concerning for very mild/early acute diverticulitis. The splenic flexure appears thickened, which may be related to decompression. A normal appendix. Patient appears to be status post umbilical and supraumbilical ventral abdominal wall herniorrhaphy. There is prominent 3.2 x 1.6 cm stranding situated within the subcutaneous tissues of the previous supraumbilical ventral abdominal wall hernia, with edema and inflammation extending to the underlying intra-abdominal fat/greater omentum. Tiny pockets of fluid (less than 1 cm) are noted along the ventral aspect of the left hepatic lobe, interposed between the liver and the anterior abdominal wall (image 16 on series 2). A small 1.3 cm nodular density within the left upper quadrant, involving the greater omentum (image 21). Hysterectomy. Right hip arthroplasty.     IMPRESSION: 1. Patient appears to be status post umbilical and supraumbilical ventral abdominal wall herniorrhaphy. Prominent subcutaneous inflammatory change overlying the previous supraumbilical hernia. This may reflect inflammation, phlegmon or fat necrosis. No obvious abscess. 2. Intra-abdominal inflammation deep to the a forementioned subcutaneous supraumbilical hernia repair, involving the greater omentum with tiny locules of fluid abutting the anterior surface of the left hepatic lobe. This likely reflects postoperative changes. Developing infectious process cannot be excluded. No drainable fluid collection or abscess. 3. Findings which may reflect a very mild/early diverticulitis of the sigmoid colon. Clinical correlation is recommended. 4. Nonobstructing right renal calculus. 5. Emphysematous changes. 6. Stable indeterminate splenic hypodensities. These may reflect tiny pseudocysts or hemangioma. Follow-up CT in 12 months to ensure stability.     ED Course       Patient CT scan does show very subtle possibility of some colitis, but her white count is trending down from previous, she has no left shift, and her pain is been present since her surgery.  I do not want to add antibiotics to the picture and complicate things, will discharge her on Bentyl and she will see Dr. Eulas Post as scheduled.    Medications   morphine injection 6 mg (6 mg IntraVENous Given 05/11/18 1649)   ondansetron (ZOFRAN) injection 4 mg (4 mg IntraVENous Given 05/11/18 1649)   iopamidol (ISOVUE-370) 76 % injection 80 mL (80 mL IntraVENous Given 05/11/18 1736)       Final Diagnosis       ICD-10-CM ICD-9-CM   1. RLQ abdominal pain R10.31 789.03         Disposition   The patient is discharged home in stable condition, with instructions to follow-up with their regular doctor.  They  are advised to return immediately for any worsening or symptoms of concern.    Helaine Chess, MD  May 11, 2018    My signature above authenticates this document and my orders, the final ??  diagnosis (es), discharge prescription (s), and instructions in the Epic ??  record.  If you have any questions please contact [phone].  ??  Nursing notes have been reviewed by the physician/ advanced practice ??  Clinician.    Dragon medical dictation software was used for portions of this report. Unintended transcription errors may occur.

## 2018-05-11 NOTE — ED Notes (Signed)
I have reviewed discharge instructions with the patient.  The patient verbalized understanding.

## 2018-05-11 NOTE — ED Notes (Signed)
Pt arrives to ED c/o burning in lower abdomen for the past couple days; pt states she had laparoscopic hernia repair surgery on 11/11 by Dr. Montez Moritaarter; pt has four incision sites, one at umbilicus, left of umbilicus, upper midline, and left upper quadrant; pt states she has taken Advil for pain with no relief

## 2018-05-11 NOTE — ED Provider Notes (Signed)
[facility]  Emergency Department Treatment Report        Patient: [name] [name] Age: 67 y.o. Sex: female    Date of Birth: 1951/03/26 Admit Date: 05/11/2018 PCP: Ernst Breach   MRN: [id number]  CSN: [id number]     Room: ER42/ER42 Time Dictated: 4:22 PM          Chief Complaint   Generalized abdominal pain    History of Present Illness   67 y.o. female status post right inguinal herniorrhaphy on 11/11 by Dr. Eulas Post, has had pain ever since and she is "tired of it".  She has not contacted Dr. Eulas Post about it.  Denies any nausea, vomiting, dizziness, syncope, melena, constipation, diarrhea, urinary symptoms, flank pain, fever, chills, or any other symptoms or injuries.    Review of Systems   ROS  Constitutional:  No fever, chills, or weight loss. Normal appetite  Eyes: No visual complaints.  ENT: No sore throat, runny nose or ear pain.  Heme/Lymph: No easy bruising or lymph node swelling  Respiratory: No cough, dyspnea or wheezing.  Cardiovascular: No chest pain, pressure, palpitations, tightness or heaviness.  Gastrointestinal: No vomiting, diarrhea.  No melena or bright red blood per rectum  Genitourinary: No dysuria, frequency, or urgency.  No hematuria  Musculoskeletal: No joint pain, swelling, or extremity pain.  Integumentary: No rashes or other skin lesions  Neurological: No headaches, sensory or motor symptoms.  Denies complaints in all other systems    Past Medical/Surgical History     Past Medical History:   Diagnosis Date   ??? Arthritis    ??? 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 COPD (chronic obstructive pulmonary disease) ([place])    ??? DM2 (diabetes mellitus, type 2) ([place])    ??? GERD (gastroesophageal reflux disease)    ??? Hepatitis C     HEP C   ??? HTN (hypertension)    ??? Lung nodule, multiple     (CT 10/2016) Small left upper lobe and 1.7 x 1.6 cm left lower lobe nodules not significantly changed from 07/23/2016 and 03/22/2016   ??? Marijuana use    ??? PTSD  (post-traumatic stress disorder)     lived through the [place] in 1993   ??? Thoracic ascending aortic aneurysm ([place])     4.4cm noted on CT Chest (10/2016)   ??? Thyroid nodule     2.5 cm stable right thyroid nodule (CT 10/2016)     Past Surgical History:   Procedure Laterality Date   ??? HX ENDOSCOPY     ??? HX HIP REPLACEMENT Right 01/29/2015   ??? HX HIP REPLACEMENT     ??? HX HYSTERECTOMY  2010    hysterectomy       Social History     Social History     Socioeconomic History   ??? Marital status: MARRIED     Spouse name: Not on file   ??? Number of children: Not on file   ??? Years of education: Not on file   ??? Highest education level: Not on file   Occupational History   ??? Not on file   Social Needs   ??? Financial resource strain: Not on file   ??? Food insecurity:     Worry: Not on file     Inability: Not on file   ??? Transportation needs:     Medical: Not on file     Non-medical: Not  on file   Tobacco Use   ??? Smoking status: Former Smoker   ??? Smokeless tobacco: Never Used   ??? Tobacco comment: reported as quit smoking around 1990s per spouse   Substance and Sexual Activity   ??? Alcohol use: No   ??? Drug use: Yes     Types: Marijuana     Comment: PTSD; last use last week as of 05/11/18   ??? Sexual activity: Never   Lifestyle   ??? Physical activity:     Days per week: Not on file     Minutes per session: Not on file   ??? Stress: Not on file   Relationships   ??? Social connections:     Talks on phone: Not on file     Gets together: Not on file     Attends religious service: Not on file     Active member of club or organization: Not on file     Attends meetings of clubs or organizations: Not on file     Relationship status: Not on file   ??? Intimate partner violence:     Fear of current or ex partner: Not on file     Emotionally abused: Not on file     Physically abused: Not on file     Forced sexual activity: Not on file   Other Topics Concern   ??? Not on file   Social History Narrative   ??? Not on file       Family History      Family History   Problem Relation Age of Onset   ??? Hypertension Mother    ??? Other Mother         OSA   ??? Cancer Father         suspected leukemia per pt's spouse   ??? Cancer Maternal Aunt    ??? Alcohol abuse Maternal Grandfather        Current Medications     Prior to Admission Medications   Prescriptions Last Dose Informant Patient Reported? Taking?   Blood Glucose Control High&Low (ACCU-CHEK AVIVA CONTROL SOLN) soln   No No   Sig: Check blood sugar daily   Blood-Glucose Meter (ACCU-CHEK AVIVA PLUS METER) misc   No No   Sig: Check blood sugar daily   Blood-Glucose Meter monitoring kit   No No   Sig: Check sugars 2 times per day, fasting and 2 hours after dinner   Insulin Needles, Disposable, (NANO PEN NEEDLE) 32 gauge x 5/32" ndle   No No   Sig: To use with Lantus pen   Lancets misc   No No   Sig: Check sugars twice daily   QUEtiapine SR (SEROQUEL XR) 150 mg sr tablet   Yes No   Sig: Take 150 mg by mouth nightly.   albuterol (ACCUNEB) 1.25 mg/3 mL nebu   No No   Sig: INHALE CONTENTS OF 1 VIAL(3 ML) BY NEBULIZATION ROUTE EVERY 6 HOURS AS NEEDED FOR SHORTNESS OF BREATH, WHEEZING   albuterol-ipratropium (DUO-NEB) 2.5 mg-0.5 mg/3 ml nebu   Yes No   Sig: 3 mL by Nebulization route every six (6) hours as needed.   alcohol swabs (BD SINGLE USE SWABS REGULAR) padm   No No   Sig: Check blood sugar daily   cloNIDine HCl (CATAPRES) 0.1 mg tablet   No No   Sig: Take 3 Tabs by mouth two (2) times a day. Take bid   dilTIAZem CD (CARDIZEM CD) 180 mg ER  capsule   No No   Sig: Take 1 Cap by mouth daily.   fluticasone propion-salmeterol (ADVAIR DISKUS) 100-50 mcg/dose diskus inhaler   No No   Sig: Take 1 Puff by inhalation two (2) times a day.   gabapentin (NEURONTIN) 300 mg capsule   No No   Sig: Take 1 Cap by mouth three (3) times daily. Max Daily Amount: 900 mg.   glucose blood VI test strips (ACCU-CHEK AVIVA PLUS TEST STRP) strip   No No   Sig: Check blood sugar daily   glucose blood VI test strips (ASCENSIA AUTODISC VI, ONE  TOUCH ULTRA TEST VI) strip   No No   Sig: Check sugars twice daily   insulin glargine (LANTUS SOLOSTAR U-100 INSULIN) 100 unit/mL (3 mL) inpn   No No   Sig: 32 Units by SubCUTAneous route nightly.   lancets (ACCU-CHEK SOFTCLIX LANCETS) misc   No No   Sig: Check blood sugar daily   losartan (COZAAR) 25 mg tablet   No No   Sig: Take 1 Tab by mouth daily.   Patient taking differently: Take 12.5 mg by mouth daily.   magnesium oxide (MAG-OX) 400 mg tablet   Yes No   Sig: Take 400 mg by mouth daily.   metFORMIN ER (GLUCOPHAGE XR) 500 mg tablet   No No   Sig: TAKE 1 TABLET TWICE DAILY   montelukast (SINGULAIR) 10 mg tablet   No No   Sig: TAKE 1 TABLET EVERY DAY   nabumetone (RELAFEN) 750 mg tablet   Yes No   Sig: Take 750 mg by mouth two (2) times a day.   nabumetone (RELAFEN) 750 mg tablet   No No   Sig: TAKE 1 TABLET BY MOUTH TWICE DAILY AS NEEDED FOR PAIN   ondansetron (ZOFRAN ODT) 4 mg disintegrating tablet   No No   Sig: Take 1 Tab by mouth every eight (8) hours as needed for Nausea. Indications: nausea   pantoprazole (PROTONIX) 40 mg tablet   No No   Sig: Take 1 Tab by mouth Daily (before breakfast).   polyethylene glycol (MIRALAX) 17 gram packet   No No   Sig: Take 1 Packet by mouth daily.   rosuvastatin (CRESTOR) 5 mg tablet   No No   Sig: TAKE 1 TABLET BY MOUTH NIGHTLY   senna-docusate (PERICOLACE) 8.6-50 mg per tablet   No No   Sig: Take 1 Tab by mouth daily.   spironolactone (ALDACTONE) 100 mg tablet   No No   Sig: TAKE 1 TABLET EVERY DAY   tiotropium bromide (SPIRIVA RESPIMAT) 2.5 mcg/actuation inhaler   No No   Sig: Take 1 Puff by inhalation daily.   traZODone (DESYREL) 50 mg tablet   No No   Sig: Take 1 Tab by mouth nightly as needed for Sleep.   triamcinolone (NASACORT) 55 mcg nasal inhaler   No No   Sig: 2 Sprays by Both Nostrils route daily.      Facility-Administered Medications: None       Allergies     Allergies   Allergen Reactions   ??? Chocolate [Cocoa] Sneezing     Not an allergy       Physical Exam      ED Triage Vitals   ED Encounter Vitals Group      BP 05/11/18 1525 (!) 125/91      Pulse (Heart Rate) 05/11/18 1525 93      Resp Rate 05/11/18 1525 18  Temp 05/11/18 1525 98.8 ??F (37.1 ??C)      Temp src --       O2 Sat (%) 05/11/18 1525 97 %      Weight 05/11/18 1541 185 lb      Height 05/11/18 1541 5' 8"     Physical Exam  Vitals signs and nursing note reviewed.   Constitutional:       Appearance: She is well-developed.   HENT:      Head: Normocephalic and atraumatic.   Eyes:      Extraocular Movements: Extraocular movements intact.   Cardiovascular:      Rate and Rhythm: Normal rate and regular rhythm.   Pulmonary:      Effort: Pulmonary effort is normal.      Breath sounds: Normal breath sounds.   Abdominal:      General: Bowel sounds are normal.      Palpations: Abdomen is soft. There is no mass.      Tenderness: There is generalized tenderness. There is no right CVA tenderness, left CVA tenderness or guarding.   Skin:     General: Skin is warm and dry.      Coloration: Skin is not jaundiced.   Neurological:      General: No focal deficit present.      Mental Status: She is alert.   Psychiatric:         Mood and Affect: Mood normal.         Behavior: Behavior normal.            Impression and Management Plan   The patient has had pain now for several weeks after hernia surgery, did not contact the surgeon about this, presents today because she is "tired of the pain" we will do a CT and labs.    Diagnostic Studies   Lab:   Recent Results (from the past 12 hour(s))   CBC WITH AUTOMATED DIFF    Collection Time: 05/11/18  4:08 PM   Result Value Ref Range    WBC 13.0 (H) 4.0 - 11.0 1000/mm3    RBC 4.39 3.60 - 5.20 M/uL    HGB 12.8 (L) 13.0 - 17.2 gm/dl    HCT 39.1 37.0 - 50.0 %    MCV 89.1 80.0 - 98.0 fL    MCH 29.2 25.4 - 34.6 pg    MCHC 32.7 30.0 - 36.0 gm/dl    PLATELET 294 140 - 450 1000/mm3    MPV 11.0 (H) 6.0 - 10.0 fL    RDW-SD 42.8 36.4 - 46.3      NRBC 0 0 - 0      IMMATURE GRANULOCYTES 0.4 0.0 - 3.0  %    NEUTROPHILS 68.0 (H) 34 - 64 %    LYMPHOCYTES 22.4 (L) 28 - 48 %    MONOCYTES 6.5 1 - 13 %    EOSINOPHILS 2.2 0 - 5 %    BASOPHILS 0.5 0 - 3 %   LIPASE    Collection Time: 05/11/18  4:08 PM   Result Value Ref Range    Lipase 113 73 - 657 U/L   METABOLIC PANEL, COMPREHENSIVE    Collection Time: 05/11/18  4:08 PM   Result Value Ref Range    Sodium 137 136 - 145 mEq/L    Potassium 4.5 3.5 - 5.1 mEq/L    Chloride 100 98 - 107 mEq/L    CO2 30 21 - 32 mEq/L    Glucose 107 (H) 74 -  106 mg/dl    BUN 22 7 - 25 mg/dl    Creatinine 1.3 0.6 - 1.3 mg/dl    GFR est AA 53.0      GFR est non-AA 44      Calcium 9.6 8.5 - 10.1 mg/dl    AST (SGOT) 11 (L) 15 - 37 U/L    ALT (SGPT) 13 12 - 78 U/L    Alk. phosphatase 82 45 - 117 U/L    Bilirubin, total 0.7 0.2 - 1.0 mg/dl    Protein, total 7.9 6.4 - 8.2 gm/dl    Albumin 3.3 (L) 3.4 - 5.0 gm/dl    Anion gap 7 5 - 15 mmol/L   POC CHEM8    Collection Time: 05/11/18  4:35 PM   Result Value Ref Range    Sodium 138 136 - 145 mEq/L    Potassium 4.6 3.5 - 4.9 mEq/L    Chloride 97 (L) 98 - 107 mEq/L    CO2, TOTAL 32 21 - 32 mmol/L    Glucose 108 (H) 74 - 106 mg/dL    BUN 27 (H) 7 - 25 mg/dl    Creatinine 1.2 0.6 - 1.3 mg/dl    HCT 59 (H) 38 - 45 %    HGB 20.1 (HH) 12.4 - 17.2 gm/dl    CALCIUM,IONIZED 4.60 4.40 - 5.40 mg/dL   POC URINE MACROSCOPIC    Collection Time: 05/11/18  4:53 PM   Result Value Ref Range    Glucose Negative NEGATIVE,Negative mg/dl    Bilirubin Negative NEGATIVE,Negative      Ketone Negative NEGATIVE,Negative mg/dl    Specific gravity 1.015 1.005 - 1.030      Blood Moderate (A) NEGATIVE,Negative      pH (UA) 5.5 5 - 9      Protein 30 (A) NEGATIVE,Negative mg/dl    Urobilinogen 0.2 0.0 - 1.0 EU/dl    Nitrites Negative NEGATIVE,Negative      Leukocyte Esterase Small (A) NEGATIVE,Negative      Color Yellow      Appearance Slightly Cloudy     POC URINE MICROSCOPIC    Collection Time: 05/11/18  4:53 PM   Result Value Ref Range    Epithelial cells, squamous >50 /LPF    WBC 5-9  /HPF    RBC OCCASIONAL /HPF    Bacteria 2+ /HPF     Labs Reviewed   CBC WITH AUTOMATED DIFF - Abnormal; Notable for the following components:       Result Value    WBC 13.0 (*)     HGB 12.8 (*)     MPV 11.0 (*)     NEUTROPHILS 68.0 (*)     LYMPHOCYTES 22.4 (*)     All other components within normal limits   METABOLIC PANEL, COMPREHENSIVE - Abnormal; Notable for the following components:    Glucose 107 (*)     AST (SGOT) 11 (*)     Albumin 3.3 (*)     All other components within normal limits   POC CHEM8 - Abnormal; Notable for the following components:    Chloride 97 (*)     Glucose 108 (*)     BUN 27 (*)     HCT 59 (*)     HGB 20.1 (*)     All other components within normal limits   POC URINE MACROSCOPIC - Abnormal; Notable for the following components:    Blood Moderate (*)     Protein 30 (*)  Leukocyte Esterase Small (*)     All other components within normal limits   LIPASE   POC URINE MICROSCOPIC       Imaging:    Ct Abd Pelv W Cont    Result Date: 05/11/2018  INDICATION: Abdominal pain COMPARISON: CT dated 02/22/2018 TECHNIQUE: CT of the abdomen and pelvis WITH intravenous contrast. DICOM format image data is available to non-affiliated external healthcare facilities or entities on a secure, media free, reciprocally searchable basis with patient authorization for 12 months following the date of the study. Findings: Scattered emphysema, most pronounced within the right middle lobe. Left lung base subsegmental atelectasis or scarring. Liver, gallbladder, and pancreas grossly unremarkable. Mild thickening of the adrenal glands. Scattered indeterminant splenic hypodensities, measuring up to 1.6 cm. Findings may reflect small pseudocysts or hemangioma. Too small to characterize bilateral renal hypodensities and a 1 cm right renal cyst. Nonobstructing 2 mm right renal calculus. Extensive sigmoid colonic diverticulosis. There is suggestion of very subtle inflammatory change surrounding a short segment of the  sigmoid colon, concerning for very mild/early acute diverticulitis. The splenic flexure appears thickened, which may be related to decompression. A normal appendix. Patient appears to be status post umbilical and supraumbilical ventral abdominal wall herniorrhaphy. There is prominent 3.2 x 1.6 cm stranding situated within the subcutaneous tissues of the previous supraumbilical ventral abdominal wall hernia, with edema and inflammation extending to the underlying intra-abdominal fat/greater omentum. Tiny pockets of fluid (less than 1 cm) are noted along the ventral aspect of the left hepatic lobe, interposed between the liver and the anterior abdominal wall (image 16 on series 2). A small 1.3 cm nodular density within the left upper quadrant, involving the greater omentum (image 21). Hysterectomy. Right hip arthroplasty.     IMPRESSION: 1. Patient appears to be status post umbilical and supraumbilical ventral abdominal wall herniorrhaphy. Prominent subcutaneous inflammatory change overlying the previous supraumbilical hernia. This may reflect inflammation, phlegmon or fat necrosis. No obvious abscess. 2. Intra-abdominal inflammation deep to the a forementioned subcutaneous supraumbilical hernia repair, involving the greater omentum with tiny locules of fluid abutting the anterior surface of the left hepatic lobe. This likely reflects postoperative changes. Developing infectious process cannot be excluded. No drainable fluid collection or abscess. 3. Findings which may reflect a very mild/early diverticulitis of the sigmoid colon. Clinical correlation is recommended. 4. Nonobstructing right renal calculus. 5. Emphysematous changes. 6. Stable indeterminate splenic hypodensities. These may reflect tiny pseudocysts or hemangioma. Follow-up CT in 12 months to ensure stability.     ED Course      Patient CT scan does show very subtle possibility of some colitis, but her white count is trending down from previous, she has  no left shift, and her pain is been present since her surgery.  I do not want to add antibiotics to the picture and complicate things, will discharge her on Bentyl and she will see Dr. Eulas Post as scheduled.    Medications   morphine injection 6 mg (6 mg IntraVENous Given 05/11/18 1649)   ondansetron (ZOFRAN) injection 4 mg (4 mg IntraVENous Given 05/11/18 1649)   iopamidol (ISOVUE-370) 76 % injection 80 mL (80 mL IntraVENous Given 05/11/18 1736)       Final Diagnosis       ICD-10-CM ICD-9-CM   1. RLQ abdominal pain R10.31 789.03         Disposition   The patient is discharged home in stable condition, with instructions to follow-up with their regular doctor.  They  are advised to return immediately for any worsening or symptoms of concern.    Helaine Chess, MD  May 11, 2018    My signature above authenticates this document and my orders, the final ??  diagnosis (es), discharge prescription (s), and instructions in the Epic ??  record.  If you have any questions please contact [phone].  ??  Nursing notes have been reviewed by the physician/ advanced practice ??  Clinician.    Dragon medical dictation software was used for portions of this report. Unintended transcription errors may occur.

## 2018-05-12 MED ORDER — DICYCLOMINE 20 MG TAB
20 mg | ORAL_TABLET | Freq: Four times a day (QID) | ORAL | 0 refills | Status: AC
Start: 2018-05-12 — End: 2018-05-16

## 2018-05-14 NOTE — Telephone Encounter (Signed)
Husband called back and states her BP this morning was 125/90 and that has been the average.

## 2018-05-14 NOTE — Telephone Encounter (Signed)
LMOM for patient's husband to call back with BP readings.

## 2018-05-16 ENCOUNTER — Encounter

## 2018-05-20 MED ORDER — CLONIDINE 0.1 MG TAB
0.1 mg | ORAL_TABLET | Freq: Two times a day (BID) | ORAL | 1 refills | Status: DC
Start: 2018-05-20 — End: 2021-01-28

## 2018-05-20 MED ORDER — LOSARTAN 25 MG TAB
25 mg | ORAL_TABLET | Freq: Every day | ORAL | 2 refills | Status: DC
Start: 2018-05-20 — End: 2018-09-07

## 2018-05-25 NOTE — Progress Notes (Signed)
Per provider, patient was given a refill on her Relafen 750 mg. She was given 30 with 0 refills. It was called into the pharmacy and patient's husband aware.

## 2018-05-25 NOTE — Progress Notes (Signed)
Per provider, patient was given a refill on her Relafen 750 mg. She was given 30 with 0 refills. It was called into the pharmacy and patient's husband aware.

## 2018-05-30 MED ORDER — ALBUTEROL SULFATE 1.25 MG/3 ML (0.042 %) SOLN FOR INHALATION
1.25 mg/3 mL | RESPIRATORY_TRACT | 1 refills | Status: AC
Start: 2018-05-30 — End: ?

## 2018-06-08 NOTE — Telephone Encounter (Signed)
Advised the patient that the medication is needing to be requested through her Pulmonologist.

## 2018-06-10 ENCOUNTER — Encounter

## 2018-06-12 MED ORDER — LANTUS SOLOSTAR U-100 INSULIN 100 UNIT/ML (3 ML) SUBCUTANEOUS PEN
100 unit/mL (3 mL) | SUBCUTANEOUS | 0 refills | Status: DC
Start: 2018-06-12 — End: 2020-06-02

## 2018-07-26 ENCOUNTER — Encounter

## 2018-07-31 DIAGNOSIS — E1140 Type 2 diabetes mellitus with diabetic neuropathy, unspecified: Secondary | ICD-10-CM

## 2018-08-01 ENCOUNTER — Inpatient Hospital Stay: Admit: 2018-08-01 | Payer: MEDICARE | Primary: Physician Assistant

## 2018-08-01 LAB — COMPREHENSIVE METABOLIC PANEL
ALT: 14 U/L (ref 12–78)
AST: 11 U/L — ABNORMAL LOW (ref 15–37)
Albumin: 4 gm/dl (ref 3.4–5.0)
Alkaline Phosphatase: 103 U/L (ref 45–117)
Anion Gap: 9 mmol/L (ref 5–15)
BUN: 19 mg/dl (ref 7–25)
CO2: 28 mEq/L (ref 21–32)
Calcium: 10.1 mg/dl (ref 8.5–10.1)
Chloride: 99 mEq/L (ref 98–107)
Creatinine: 0.9 mg/dl (ref 0.6–1.3)
EGFR IF NonAfrican American: >60
GFR African American: >60
Glucose: 93 mg/dl (ref 74–106)
Potassium: 3.4 mEq/L — ABNORMAL LOW (ref 3.5–5.1)
Sodium: 136 mEq/L (ref 136–145)
Total Bilirubin: 0.5 mg/dl (ref 0.2–1.0)
Total Protein: 8.9 gm/dl — ABNORMAL HIGH (ref 6.4–8.2)

## 2018-08-01 LAB — LIPID PANEL
CHOL/HDL Ratio: 2.1 Ratio (ref 0.0–4.4)
Chol/HDL Ratio: 2.1 Ratio (ref 0.0–4.4)
Cholesterol, Total: 128 mg/dl — ABNORMAL LOW (ref 140–199)
Cholesterol, total: 128 mg/dl — ABNORMAL LOW (ref 140–199)
HDL Cholesterol: 61 mg/dl (ref 40–96)
HDL: 61 mg/dl (ref 40–96)
LDL Calculated: 47 mg/dl (ref 0–130)
LDL, calculated: 47 mg/dl (ref 0–130)
Triglyceride: 100 mg/dl (ref 29–150)
Triglycerides: 100 mg/dl (ref 29–150)

## 2018-08-01 LAB — CBC WITH AUTO DIFFERENTIAL
Basophils %: 0.4 % (ref 0–3)
Eosinophils %: 0.6 % (ref 0–5)
Hematocrit: 46.6 % (ref 37.0–50.0)
Hemoglobin: 15.2 gm/dl (ref 13.0–17.2)
Immature Granulocytes: 0.3 % (ref 0.0–3.0)
Lymphocytes %: 24.7 % — ABNORMAL LOW (ref 28–48)
MCH: 28.7 pg (ref 25.4–34.6)
MCHC: 32.6 gm/dl (ref 30.0–36.0)
MCV: 88.1 fL (ref 80.0–98.0)
MPV: 12 fL — ABNORMAL HIGH (ref 6.0–10.0)
Monocytes %: 7.5 % (ref 1–13)
Neutrophils %: 66.5 % — ABNORMAL HIGH (ref 34–64)
Nucleated RBCs: 0 (ref 0–0)
Platelets: 285 10*3/uL (ref 140–450)
RBC: 5.29 M/uL — ABNORMAL HIGH (ref 3.60–5.20)
RDW-SD: 42 (ref 36.4–46.3)
WBC: 15.5 10*3/uL — ABNORMAL HIGH (ref 4.0–11.0)

## 2018-08-01 LAB — HEMOGLOBIN A1C W/O EAG
Hemoglobin A1C: 6 % — ABNORMAL HIGH (ref 4.2–5.6)
Hemoglobin A1c: 6 % — ABNORMAL HIGH (ref 4.2–5.6)

## 2018-08-01 LAB — CBC WITH AUTOMATED DIFF
BASOPHILS: 0.4 % (ref 0–3)
EOSINOPHILS: 0.6 % (ref 0–5)
HCT: 46.6 % (ref 37.0–50.0)
HGB: 15.2 gm/dl (ref 13.0–17.2)
IMMATURE GRANULOCYTES: 0.3 % (ref 0.0–3.0)
LYMPHOCYTES: 24.7 % — ABNORMAL LOW (ref 28–48)
MCH: 28.7 pg (ref 25.4–34.6)
MCHC: 32.6 gm/dl (ref 30.0–36.0)
MCV: 88.1 fL (ref 80.0–98.0)
MONOCYTES: 7.5 % (ref 1–13)
MPV: 12 fL — ABNORMAL HIGH (ref 6.0–10.0)
NEUTROPHILS: 66.5 % — ABNORMAL HIGH (ref 34–64)
NRBC: 0 (ref 0–0)
PLATELET: 285 10*3/uL (ref 140–450)
RBC: 5.29 M/uL — ABNORMAL HIGH (ref 3.60–5.20)
RDW-SD: 42 (ref 36.4–46.3)
WBC: 15.5 10*3/uL — ABNORMAL HIGH (ref 4.0–11.0)

## 2018-08-01 LAB — METABOLIC PANEL, COMPREHENSIVE
ALT (SGPT): 14 U/L (ref 12–78)
AST (SGOT): 11 U/L — ABNORMAL LOW (ref 15–37)
Albumin: 4 gm/dl (ref 3.4–5.0)
Alk. phosphatase: 103 U/L (ref 45–117)
Anion gap: 9 mmol/L (ref 5–15)
BUN: 19 mg/dl (ref 7–25)
Bilirubin, total: 0.5 mg/dl (ref 0.2–1.0)
CO2: 28 mEq/L (ref 21–32)
Calcium: 10.1 mg/dl (ref 8.5–10.1)
Chloride: 99 mEq/L (ref 98–107)
Creatinine: 0.9 mg/dl (ref 0.6–1.3)
GFR est AA: >60
GFR est non-AA: >60
Glucose: 93 mg/dl (ref 74–106)
Potassium: 3.4 mEq/L — ABNORMAL LOW (ref 3.5–5.1)
Protein, total: 8.9 gm/dl — ABNORMAL HIGH (ref 6.4–8.2)
Sodium: 136 mEq/L (ref 136–145)

## 2018-08-04 ENCOUNTER — Emergency Department: Admit: 2018-08-05 | Payer: MEDICARE | Primary: Physician Assistant

## 2018-08-04 DIAGNOSIS — J441 Chronic obstructive pulmonary disease with (acute) exacerbation: Principal | ICD-10-CM

## 2018-08-04 NOTE — ED Triage Notes (Signed)
HTN and headache x 2 days  Took BP meds denies any weakness to arms and legs

## 2018-08-04 NOTE — ED Notes (Addendum)
In progress.

## 2018-08-04 NOTE — ED Notes (Signed)
HTN and headache x 2 days  Took BP meds denies any weakness to arms and legs

## 2018-08-04 NOTE — ED Notes (Signed)
In progress.

## 2018-08-05 ENCOUNTER — Inpatient Hospital Stay
Admit: 2018-08-05 | Discharge: 2018-08-08 | Disposition: A | Discharge status: Home / Self Care | Payer: MEDICARE | Attending: Internal Medicine | Admitting: Internal Medicine

## 2018-08-05 LAB — CBC WITH AUTO DIFFERENTIAL
Basophils %: 0.5 % (ref 0–3)
Eosinophils %: 0.8 % (ref 0–5)
Hematocrit: 43.1 % (ref 37.0–50.0)
Hemoglobin: 14.2 gm/dl (ref 13.0–17.2)
Immature Granulocytes: 0.3 % (ref 0.0–3.0)
Lymphocytes %: 21.6 % — ABNORMAL LOW (ref 28–48)
MCH: 28.1 pg (ref 25.4–34.6)
MCHC: 32.9 gm/dl (ref 30.0–36.0)
MCV: 85.2 fL (ref 80.0–98.0)
MPV: 11.3 fL — ABNORMAL HIGH (ref 6.0–10.0)
Monocytes %: 6.7 % (ref 1–13)
Neutrophils %: 70.1 % — ABNORMAL HIGH (ref 34–64)
Nucleated RBCs: 0 (ref 0–0)
Platelets: 298 10*3/uL (ref 140–450)
RBC: 5.06 M/uL (ref 3.60–5.20)
RDW-SD: 40.6 (ref 36.4–46.3)
WBC: 16.9 10*3/uL — ABNORMAL HIGH (ref 4.0–11.0)

## 2018-08-05 LAB — INFLUENZA A/B, BY PCR
Influenza A PCR: NEGATIVE
Influenza A PCR: NEGATIVE
Influenza B PCR: NEGATIVE
Influenza B PCR: NEGATIVE

## 2018-08-05 LAB — POCT GLUCOSE
POC Glucose: 171 mg/dL — ABNORMAL HIGH (ref 65–105)
POC Glucose: 218 mg/dL — ABNORMAL HIGH (ref 65–105)
POC Glucose: 196 mg/dL — ABNORMAL HIGH (ref 65–105)

## 2018-08-05 LAB — BASIC METABOLIC PANEL
Anion Gap: 7 mmol/L (ref 5–15)
BUN: 21 mg/dl (ref 7–25)
CO2: 30 mEq/L (ref 21–32)
Calcium: 10.5 mg/dl — ABNORMAL HIGH (ref 8.5–10.1)
Chloride: 103 mEq/L (ref 98–107)
Creatinine: 1 mg/dl (ref 0.6–1.3)
EGFR IF NonAfrican American: 59
GFR African American: >60
Glucose: 145 mg/dl — ABNORMAL HIGH (ref 74–106)
Potassium: 3.4 mEq/L — ABNORMAL LOW (ref 3.5–5.1)
Sodium: 140 mEq/L (ref 136–145)

## 2018-08-05 LAB — METABOLIC PANEL, BASIC
Anion gap: 7 mmol/L (ref 5–15)
BUN: 21 mg/dl (ref 7–25)
CO2: 30 mEq/L (ref 21–32)
Calcium: 10.5 mg/dl — ABNORMAL HIGH (ref 8.5–10.1)
Chloride: 103 mEq/L (ref 98–107)
Creatinine: 1 mg/dl (ref 0.6–1.3)
GFR est AA: >60
GFR est non-AA: 59
Glucose: 145 mg/dl — ABNORMAL HIGH (ref 74–106)
Potassium: 3.4 mEq/L — ABNORMAL LOW (ref 3.5–5.1)
Sodium: 140 mEq/L (ref 136–145)

## 2018-08-05 LAB — GLUCOSE, POC
Glucose (POC): 171 mg/dL — ABNORMAL HIGH (ref 65–105)
Glucose (POC): 218 mg/dL — ABNORMAL HIGH (ref 65–105)
Glucose (POC): 196 mg/dL — ABNORMAL HIGH (ref 65–105)

## 2018-08-05 LAB — CBC WITH AUTOMATED DIFF
BASOPHILS: 0.5 % (ref 0–3)
EOSINOPHILS: 0.8 % (ref 0–5)
HCT: 43.1 % (ref 37.0–50.0)
HGB: 14.2 gm/dl (ref 13.0–17.2)
IMMATURE GRANULOCYTES: 0.3 % (ref 0.0–3.0)
LYMPHOCYTES: 21.6 % — ABNORMAL LOW (ref 28–48)
MCH: 28.1 pg (ref 25.4–34.6)
MCHC: 32.9 gm/dl (ref 30.0–36.0)
MCV: 85.2 fL (ref 80.0–98.0)
MONOCYTES: 6.7 % (ref 1–13)
MPV: 11.3 fL — ABNORMAL HIGH (ref 6.0–10.0)
NEUTROPHILS: 70.1 % — ABNORMAL HIGH (ref 34–64)
NRBC: 0 (ref 0–0)
PLATELET: 298 10*3/uL (ref 140–450)
RBC: 5.06 M/uL (ref 3.60–5.20)
RDW-SD: 40.6 (ref 36.4–46.3)
WBC: 16.9 10*3/uL — ABNORMAL HIGH (ref 4.0–11.0)

## 2018-08-05 MED ORDER — HYDROCODONE-ACETAMINOPHEN 5 MG-325 MG TAB
5-325 mg | Freq: Four times a day (QID) | ORAL | Status: DC | PRN
Start: 2018-08-05 — End: 2018-08-08
  Administered 2018-08-05 – 2018-08-07 (×5): via ORAL

## 2018-08-05 MED ORDER — DIPHENHYDRAMINE HCL 50 MG/ML IJ SOLN
50 mg/mL | Freq: Once | INTRAMUSCULAR | Status: AC
Start: 2018-08-05 — End: 2018-08-05
  Administered 2018-08-05: 06:00:00 via INTRAVENOUS

## 2018-08-05 MED ORDER — AZITHROMYCIN 250 MG TAB
250 mg | Freq: Every day | ORAL | Status: DC
Start: 2018-08-05 — End: 2018-08-08
  Administered 2018-08-06 – 2018-08-08 (×3): via ORAL

## 2018-08-05 MED ORDER — POLYETHYLENE GLYCOL 3350 17 GRAM (100 %) ORAL POWDER PACKET
17 gram | Freq: Every day | ORAL | Status: DC | PRN
Start: 2018-08-05 — End: 2018-08-08

## 2018-08-05 MED ORDER — FLUTICASONE 100 MCG-VILANTEROL 25 MCG/DOSE BREATH ACTIVATED INHALER
100-25 mcg/dose | Freq: Every day | RESPIRATORY_TRACT | Status: DC
Start: 2018-08-05 — End: 2018-08-08
  Administered 2018-08-05 – 2018-08-08 (×4): via RESPIRATORY_TRACT

## 2018-08-05 MED ORDER — BUTALBITAL-ACETAMINOPHEN-CAFFEINE 50 MG-325 MG-40 MG TAB
50-325-40 mg | ORAL | Status: DC | PRN
Start: 2018-08-05 — End: 2018-08-08
  Administered 2018-08-05 – 2018-08-07 (×3): via ORAL

## 2018-08-05 MED ORDER — GLUCAGON 1 MG INJECTION
1 mg | INTRAMUSCULAR | Status: DC | PRN
Start: 2018-08-05 — End: 2018-08-08

## 2018-08-05 MED ORDER — SPIRONOLACTONE 25 MG TAB
25 mg | Freq: Every day | ORAL | Status: DC
Start: 2018-08-05 — End: 2018-08-08
  Administered 2018-08-05 – 2018-08-08 (×4): via ORAL

## 2018-08-05 MED ORDER — DEXAMETHASONE SODIUM PHOSPHATE 10 MG/ML IJ SOLN
10 mg/mL | Freq: Once | INTRAMUSCULAR | Status: AC
Start: 2018-08-05 — End: 2018-08-04
  Administered 2018-08-05: 05:00:00 via INTRAVENOUS

## 2018-08-05 MED ORDER — ACETAMINOPHEN (TYLENOL) SOLUTION 32MG/ML
ORAL | Status: DC | PRN
Start: 2018-08-05 — End: 2018-08-08

## 2018-08-05 MED ORDER — DEXTROSE 50% IN WATER (D50W) IV
INTRAVENOUS | Status: DC | PRN
Start: 2018-08-05 — End: 2018-08-08

## 2018-08-05 MED ORDER — TIOTROPIUM BROMIDE 2.5 MCG/ACTUATION MIST FOR INHALATION
2.5 mcg/actuation | Freq: Every day | RESPIRATORY_TRACT | Status: DC
Start: 2018-08-05 — End: 2018-08-08
  Administered 2018-08-05 – 2018-08-08 (×4): via RESPIRATORY_TRACT

## 2018-08-05 MED ORDER — IPRATROPIUM-ALBUTEROL 2.5 MG-0.5 MG/3 ML NEB SOLUTION
2.5 mg-0.5 mg/3 ml | RESPIRATORY_TRACT | Status: AC
Start: 2018-08-05 — End: 2018-08-04
  Administered 2018-08-05: 05:00:00 via RESPIRATORY_TRACT

## 2018-08-05 MED ORDER — GABAPENTIN 300 MG CAP
300 mg | Freq: Three times a day (TID) | ORAL | Status: DC
Start: 2018-08-05 — End: 2018-08-08
  Administered 2018-08-05 – 2018-08-08 (×10): via ORAL

## 2018-08-05 MED ORDER — NALOXONE 0.4 MG/ML INJECTION
0.4 mg/mL | INTRAMUSCULAR | Status: DC | PRN
Start: 2018-08-05 — End: 2018-08-08

## 2018-08-05 MED ORDER — MAGNESIUM OXIDE 400 MG TAB
400 mg | Freq: Every day | ORAL | Status: DC
Start: 2018-08-05 — End: 2018-08-08
  Administered 2018-08-05 – 2018-08-08 (×4): via ORAL

## 2018-08-05 MED ORDER — ACETAMINOPHEN 650 MG RECTAL SUPPOSITORY
650 mg | RECTAL | Status: DC | PRN
Start: 2018-08-05 — End: 2018-08-08

## 2018-08-05 MED ORDER — ENOXAPARIN 40 MG/0.4 ML SUB-Q SYRINGE
40 mg/0.4 mL | SUBCUTANEOUS | Status: DC
Start: 2018-08-05 — End: 2018-08-08
  Administered 2018-08-05 – 2018-08-08 (×4): via SUBCUTANEOUS

## 2018-08-05 MED ORDER — DILTIAZEM ER 180 MG 24 HR CAP
180 mg | Freq: Every day | ORAL | Status: DC
Start: 2018-08-05 — End: 2018-08-08
  Administered 2018-08-05 – 2018-08-08 (×4): via ORAL

## 2018-08-05 MED ORDER — ACETAMINOPHEN 325 MG TABLET
325 mg | ORAL | Status: DC | PRN
Start: 2018-08-05 — End: 2018-08-08

## 2018-08-05 MED ORDER — SODIUM CHLORIDE 0.9 % IV
500 mg | INTRAVENOUS | Status: AC
Start: 2018-08-05 — End: 2018-08-05
  Administered 2018-08-05: 07:00:00 via INTRAVENOUS

## 2018-08-05 MED ORDER — PROCHLORPERAZINE EDISYLATE 5 MG/ML INJECTION
5 mg/mL | INTRAMUSCULAR | Status: AC
Start: 2018-08-05 — End: 2018-08-05
  Administered 2018-08-05: 06:00:00 via INTRAVENOUS

## 2018-08-05 MED ORDER — SODIUM CHLORIDE 0.9% BOLUS IV
0.9 % | INTRAVENOUS | Status: AC
Start: 2018-08-05 — End: 2018-08-05
  Administered 2018-08-05: 05:00:00 via INTRAVENOUS

## 2018-08-05 MED ORDER — QUETIAPINE SR 50 MG 24 HR TAB
50 mg | Freq: Every evening | ORAL | Status: DC
Start: 2018-08-05 — End: 2018-08-08
  Administered 2018-08-06 – 2018-08-08 (×3): via ORAL

## 2018-08-05 MED ORDER — PREDNISONE 20 MG TAB
20 mg | Freq: Every day | ORAL | Status: DC
Start: 2018-08-05 — End: 2018-08-08
  Administered 2018-08-05 – 2018-08-08 (×4): via ORAL

## 2018-08-05 MED ORDER — CLONIDINE 0.1 MG TAB
0.1 mg | Freq: Two times a day (BID) | ORAL | Status: DC
Start: 2018-08-05 — End: 2018-08-08
  Administered 2018-08-05 – 2018-08-08 (×7): via ORAL

## 2018-08-05 MED ORDER — ALBUTEROL SULFATE 0.083 % (0.83 MG/ML) SOLN FOR INHALATION
2.5 mg /3 mL (0.083 %) | RESPIRATORY_TRACT | Status: AC
Start: 2018-08-05 — End: 2018-08-04
  Administered 2018-08-05 (×2): via RESPIRATORY_TRACT

## 2018-08-05 MED ORDER — INSULIN LISPRO 100 UNIT/ML INJECTION
100 unit/mL | SUBCUTANEOUS | Status: DC | PRN
Start: 2018-08-05 — End: 2018-08-08

## 2018-08-05 MED ORDER — SODIUM CHLORIDE 0.9 % IV PIGGY BACK
1 gram | INTRAVENOUS | Status: AC
Start: 2018-08-05 — End: 2018-08-05
  Administered 2018-08-05: 06:00:00 via INTRAVENOUS

## 2018-08-05 MED ORDER — AZITHROMYCIN 500 MG IV SOLUTION
500 mg | INTRAVENOUS | Status: DC
Start: 2018-08-05 — End: 2018-08-05

## 2018-08-05 MED ORDER — DIPHENHYDRAMINE 25 MG CAP
25 mg | ORAL | Status: DC | PRN
Start: 2018-08-05 — End: 2018-08-08

## 2018-08-05 MED ORDER — LOSARTAN 25 MG TAB
25 mg | Freq: Every day | ORAL | Status: DC
Start: 2018-08-05 — End: 2018-08-08
  Administered 2018-08-05 – 2018-08-08 (×4): via ORAL

## 2018-08-05 MED ORDER — TRAZODONE 50 MG TAB
50 mg | Freq: Every evening | ORAL | Status: DC | PRN
Start: 2018-08-05 — End: 2018-08-08

## 2018-08-05 MED ORDER — ALBUTEROL SULFATE 0.083 % (0.83 MG/ML) SOLN FOR INHALATION
2.5 mg /3 mL (0.083 %) | Freq: Four times a day (QID) | RESPIRATORY_TRACT | Status: DC
Start: 2018-08-05 — End: 2018-08-08
  Administered 2018-08-05 – 2018-08-08 (×10): via RESPIRATORY_TRACT

## 2018-08-05 MED ORDER — MORPHINE 2 MG/ML INJECTION
2 mg/mL | Freq: Four times a day (QID) | INTRAMUSCULAR | Status: DC | PRN
Start: 2018-08-05 — End: 2018-08-08

## 2018-08-05 MED ORDER — MONTELUKAST 10 MG TAB
10 mg | Freq: Every evening | ORAL | Status: DC
Start: 2018-08-05 — End: 2018-08-08
  Administered 2018-08-06 – 2018-08-08 (×3): via ORAL

## 2018-08-05 MED ORDER — INSULIN GLARGINE 100 UNIT/ML INJECTION
100 unit/mL | Freq: Every evening | SUBCUTANEOUS | Status: DC
Start: 2018-08-05 — End: 2018-08-08
  Administered 2018-08-06 – 2018-08-08 (×3): via SUBCUTANEOUS

## 2018-08-05 MED ORDER — PROCHLORPERAZINE EDISYLATE 5 MG/ML INJECTION
5 mg/mL | Freq: Four times a day (QID) | INTRAMUSCULAR | Status: DC | PRN
Start: 2018-08-05 — End: 2018-08-08
  Administered 2018-08-08 (×2): via INTRAVENOUS

## 2018-08-05 MED ORDER — ONDANSETRON (PF) 4 MG/2 ML INJECTION
4 mg/2 mL | INTRAMUSCULAR | Status: DC | PRN
Start: 2018-08-05 — End: 2018-08-08

## 2018-08-05 MED ORDER — INSULIN LISPRO 100 UNIT/ML INJECTION
100 unit/mL | Freq: Four times a day (QID) | SUBCUTANEOUS | Status: DC
Start: 2018-08-05 — End: 2018-08-08
  Administered 2018-08-05 – 2018-08-08 (×13): via SUBCUTANEOUS

## 2018-08-05 MED ORDER — ACETAMINOPHEN 325 MG TABLET
325 mg | ORAL | Status: AC
Start: 2018-08-05 — End: 2018-08-05
  Administered 2018-08-05: 06:00:00 via ORAL

## 2018-08-05 MED ORDER — HYDRALAZINE 20 MG/ML IJ SOLN
20 mg/mL | Freq: Four times a day (QID) | INTRAMUSCULAR | Status: DC | PRN
Start: 2018-08-05 — End: 2018-08-08

## 2018-08-05 MED ORDER — ROSUVASTATIN 5 MG TAB
5 mg | Freq: Every evening | ORAL | Status: DC
Start: 2018-08-05 — End: 2018-08-08
  Administered 2018-08-06 – 2018-08-08 (×3): via ORAL

## 2018-08-05 MED FILL — CLONIDINE 0.1 MG TAB: 0.1 mg | ORAL | Qty: 3

## 2018-08-05 MED FILL — LOSARTAN 25 MG TAB: 25 mg | ORAL | Qty: 1

## 2018-08-05 MED FILL — ALBUTEROL SULFATE 0.083 % (0.83 MG/ML) SOLN FOR INHALATION: 2.5 mg /3 mL (0.083 %) | RESPIRATORY_TRACT | Qty: 1

## 2018-08-05 MED FILL — IPRATROPIUM-ALBUTEROL 2.5 MG-0.5 MG/3 ML NEB SOLUTION: 2.5 mg-0.5 mg/3 ml | RESPIRATORY_TRACT | Qty: 3

## 2018-08-05 MED FILL — HYDROCODONE-ACETAMINOPHEN 5 MG-325 MG TAB: 5-325 mg | ORAL | Qty: 1

## 2018-08-05 MED FILL — ENOXAPARIN 40 MG/0.4 ML SUB-Q SYRINGE: 40 mg/0.4 mL | SUBCUTANEOUS | Qty: 0.4

## 2018-08-05 MED FILL — BREO ELLIPTA 100 MCG-25 MCG/DOSE POWDER FOR INHALATION: 100-25 mcg/dose | RESPIRATORY_TRACT | Qty: 28

## 2018-08-05 MED FILL — AZITHROMYCIN 500 MG IV SOLUTION: 500 mg | INTRAVENOUS | Qty: 5

## 2018-08-05 MED FILL — PREDNISONE 20 MG TAB: 20 mg | ORAL | Qty: 2

## 2018-08-05 MED FILL — TYLENOL 325 MG TABLET: 325 mg | ORAL | Qty: 3

## 2018-08-05 MED FILL — PROCHLORPERAZINE EDISYLATE 5 MG/ML INJECTION: 5 mg/mL | INTRAMUSCULAR | Qty: 2

## 2018-08-05 MED FILL — SPIRIVA RESPIMAT 2.5 MCG/ACTUATION SOLUTION FOR INHALATION: 2.5 mcg/actuation | RESPIRATORY_TRACT | Qty: 0

## 2018-08-05 MED FILL — DIPHENHYDRAMINE HCL 50 MG/ML IJ SOLN: 50 mg/mL | INTRAMUSCULAR | Qty: 1

## 2018-08-05 MED FILL — GABAPENTIN 300 MG CAP: 300 mg | ORAL | Qty: 1

## 2018-08-05 MED FILL — BUTALBITAL-ACETAMINOPHEN-CAFFEINE 50 MG-325 MG-40 MG TAB: 50-325-40 mg | ORAL | Qty: 1

## 2018-08-05 MED FILL — MAGNESIUM OXIDE 400 MG TAB: 400 mg | ORAL | Qty: 1

## 2018-08-05 MED FILL — CEFTRIAXONE 1 GRAM SOLUTION FOR INJECTION: 1 gram | INTRAMUSCULAR | Qty: 1

## 2018-08-05 MED FILL — DILTIAZEM ER 180 MG 24 HR CAP: 180 mg | ORAL | Qty: 1

## 2018-08-05 MED FILL — DEXAMETHASONE SODIUM PHOSPHATE 10 MG/ML IJ SOLN: 10 mg/mL | INTRAMUSCULAR | Qty: 1

## 2018-08-05 MED FILL — SPIRONOLACTONE 25 MG TAB: 25 mg | ORAL | Qty: 4

## 2018-08-05 NOTE — Progress Notes (Signed)
Bedside report given to [place] incoming nurse from [facility], SBAR reviewed.  On RA, no acute distress, oxygen continued 2L nc. Spouse at bedside.    PAGER ID: [phone]   MESSAGE: Ms Keyon Blye Rm 4208 BP 155/109 HR108.Takes cardiac meds at home and insulin.Can she get orders meds and you want glucomander?also DNR order.

## 2018-08-05 NOTE — ED Notes (Signed)
TRANSFER - OUT REPORT:    Verbal report given to [facility], RN (name) on Alanni R Usman  being transferred to [facility] (unit) for routine progression of care       Report consisted of patient???s Situation, Background, Assessment and   Recommendations(SBAR).     Information from the following report(s) SBAR was reviewed with the receiving nurse.    Lines:   Peripheral IV 08/04/18 Right;Upper Arm (Active)   Site Assessment Clean, dry, & intact 08/04/2018 11:07 PM   Phlebitis Assessment 0 08/04/2018 11:07 PM   Infiltration Assessment 0 08/04/2018 11:07 PM   Dressing Status Clean, dry, & intact 08/04/2018 11:07 PM   Dressing Type Transparent 08/04/2018 11:07 PM   Hub Color/Line Status Pink;Flushed;Patent 08/04/2018 11:07 PM   Action Taken Blood drawn 08/04/2018 11:07 PM   Alcohol Cap Used Yes 08/04/2018 11:07 PM        Opportunity for questions and clarification was provided.      Patient transported with:   Registered Nurse

## 2018-08-05 NOTE — Progress Notes (Signed)
Problem: Falls - Risk of  Goal: *Absence of Falls  Description  Document Schmid Fall Risk and appropriate interventions in the flowsheet.  Outcome: Progressing Towards Goal  Note: Fall Risk Interventions:  Mobility Interventions: Bed/chair exit alarm, Patient to call before getting OOB         Medication Interventions: Patient to call before getting OOB    Elimination Interventions: Call light in reach              Problem: Patient Education: Go to Patient Education Activity  Goal: Patient/Family Education  Outcome: Progressing Towards Goal     Problem: Risk for Spread of Infection  Goal: Prevent transmission of infectious organism to others  Description  Prevent the transmission of infectious organisms to other patients, staff members, and visitors.  Outcome: Progressing Towards Goal     Problem: Patient Education:  Go to Education Activity  Goal: Patient/Family Education  Outcome: Progressing Towards Goal     Problem: Gas Exchange - Impaired  Goal: *Absence of hypoxia  Outcome: Progressing Towards Goal     Problem: Pressure Injury - Risk of  Goal: *Prevention of pressure injury  Description  Document Braden Scale and appropriate interventions in the flowsheet.  Outcome: Progressing Towards Goal  Note: Pressure Injury Interventions:       Moisture Interventions: Internal/External urinary devices, Absorbent underpads, Apply protective barrier, creams and emollients    Activity Interventions: Increase time out of bed, Pressure redistribution bed/mattress(bed type)    Mobility Interventions: Pressure redistribution bed/mattress (bed type)    Nutrition Interventions: Document food/fluid/supplement intake    Friction and Shear Interventions: Apply protective barrier, creams and emollients                Problem: Patient Education: Go to Patient Education Activity  Goal: Patient/Family Education  Outcome: Progressing Towards Goal

## 2018-08-05 NOTE — Progress Notes (Signed)
Problem: Falls - Risk of  Goal: *Absence of Falls  Description  Document Schmid Fall Risk and appropriate interventions in the flowsheet.  Outcome: Progressing Towards Goal  Note: Fall Risk Interventions:                                Problem: Patient Education: Go to Patient Education Activity  Goal: Patient/Family Education  Outcome: Progressing Towards Goal

## 2018-08-05 NOTE — ED Notes (Signed)
Patient urinated all over herself and the bed. This nurse cleaned patient, changed patient, and the bed. Patient restarted on monitor and placed back into the bed with clean linen. Patient has decided that she would like to stay in the hospital as per her conversation with Dr Fick. Dr Fick is aware.

## 2018-08-05 NOTE — H&P (Addendum)
History & Physical    08/04/2018  Primary Care Provider: Wallene Huh, PA-C  Source of Information: Patient     Chief complaint: HA, cough and sputum        Assessment:    Active Problems:    COPD exacerbation ([place]) (11/14/2017)      Headache (08/05/2018)        Impression:   This is a 68 year old African female with past medical history of COPD with bullous emphysema, chronic pain, hypertension, insulin-dependent diabetes, who presents with a chief complaint primarily of headache also some symptoms of cough and sputum production but parallel timing of headache.  Reported history of headache with poorly controlled blood pressure which would seem to be the case on intake, she has no alarm symptoms for an acute intracranial process at this time.    Plan:    1. COPD Exacerbation:  -Status post dexamethasone and ceftriaxone, azithromycin IV in the emergency department  -However will hold off on continuing ceftriaxone given minimal wheeze and cough during exam, though noting elevated white count. Consider resuming if develops fever or worsening sputum production  -We will continue treatment with oral steroids, scheduled albuterol nebs, daily Spiriva  -Add Breo for home Advair, continue home Singulair  -We will continue p.o. azithromycin  -Check CBC tomorrow  -O2 support, wean as tolerated    2.  Headache  -Treat for hypertension as below  -We will check an ESR tomorrow a.m.  -If symptoms worsen or continue consider head CT, but will hold off on this time as no alarm symptoms  -Treat supportively with p.o. Fioricet and IV Compazine as needed  -PRN norco and IV morphine if needed for more severe pain.    3.  Hypertension:  -Continue home clonidine, diltiazem, losartan, Aldactone  -Monitor for improvement with headache with improvement in blood pressure  -We will have IV hydralazine if needed for markedly elevated blood pressures.    4.  Diabetes:  -A1c of 6 earlier this month   -We will order Glucomander at standard regimen given steroid use  -Holding home metformin    5.  Peripheral neuropathy:  -Continue home gabapentin    6.  Chronic right hip pain  -Does not appear to be on a scheduled medication for this at home  -Spouse reports no longer takes nabumetone    7.  Marijuana abuse:  -Counseled on cessation given COPD    8.  PTSD:  -Continue home Seroquel nightly  -Trazodone nightly as needed    9.  Hyperlipidemia:  -Continue home Crestor    Prophylaxis: Lovenox subcu daily    CODE STATUS: DNR.  The patient does confirm DNR status twice when asked.  Spouse reports that she has a DNR on file but that he would suggest a trial of mechanical ventilation however patient is consistent in her request for DNR.        History of Presenting Illness:  [name] [name] is a 68 y.o. 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  She reports headache is her chief complaint and reports this is been intractable, with a pounding sensation originally on the left side but now more on the right side.  She states this  typically is worse when her blood pressure is elevated.  Blood pressures were intake were up to the 657Q systolic.  However she denies any change in hearing, vision, or any acute numbness or weakness.  Although she reports chronic numbness and tingling at the toes bilaterally, for which she states she takes gabapentin.  She also reports some cough, wheeze, and white sputum production for the last 3 to 4 days as well.  She reports headache is unchanged after initial treatment in the emergency department including IV Compazine and Benadryl.  No other exacerbating or relieving factors are identified.  She denies  any fevers, chills, nausea, vomiting, changes in urinary habits.  She is asked to be admitted for further evaluation and treatment.      PMHX:  Past Medical History:   Diagnosis Date   ??? Arthritis    ??? 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 DM2 (diabetes mellitus, type 2) ([place])    ??? GERD (gastroesophageal reflux disease)    ??? Hepatitis C     HEP C   ??? HTN (hypertension)    ??? Lung nodule, multiple     (CT 10/2016) Small left upper lobe and 1.7 x 1.6 cm left lower lobe nodules not significantly changed from 07/23/2016 and 03/22/2016   ??? Marijuana use    ??? PTSD (post-traumatic stress disorder)     lived through the [place] in 1993   ??? Thoracic ascending aortic aneurysm ([place])     4.4cm noted on CT Chest (10/2016)   ??? Thyroid nodule     2.5 cm stable right thyroid nodule (CT 10/2016)        PSHX:  Past Surgical History:   Procedure Laterality Date   ??? HX ENDOSCOPY     ??? HX HERNIA REPAIR  2019    x2   ??? HX HIP REPLACEMENT Right 01/29/2015   ??? HX HIP REPLACEMENT     ??? HX HYSTERECTOMY  2010    hysterectomy       MEDICATIONS:  Prior to Admission medications    Medication Sig Start Date End Date Taking? Authorizing Provider   LANTUS SOLOSTAR U-100 INSULIN 100 unit/mL (3 mL) inpn INJECT 32 UNITS SUBCUTANEOUSLY  NIGHTLY 06/12/18  Yes Brock, Monique, PA-C   albuterol (ACCUNEB) 1.25 mg/3 mL nebu USE 1 VIAL IN NEBULIZER EVERY 6 HOURS AS NEEDED FOR  SHORTNESS  OF  BREATH,  WHEEZING 05/30/18  Yes Fransisco Beau, Monique, PA-C   losartan (COZAAR) 25 mg tablet Take 0.5 Tabs by mouth daily. 05/20/18  Yes Wallene Huh, PA-C   cloNIDine HCl (CATAPRES) 0.1 mg tablet Take 3 Tabs by mouth two (2) times a day. 05/20/18  Yes Wallene Huh, PA-C   spironolactone (ALDACTONE) 100 mg tablet TAKE 1 TABLET EVERY DAY 04/27/18  Yes Wallene Huh, PA-C   metFORMIN ER (GLUCOPHAGE XR) 500 mg tablet TAKE 1 TABLET TWICE DAILY  03/30/18  Yes Fransisco Beau, Monique, PA-C   montelukast (SINGULAIR) 10 mg tablet TAKE 1 TABLET EVERY DAY 03/26/18  Yes Wallene Huh, PA-C   gabapentin (NEURONTIN) 300 mg capsule Take 1 Cap by mouth three (3) times daily. Max Daily Amount: 900 mg. 03/01/18  Yes Fransisco Beau, Monique, PA-C   magnesium oxide (MAG-OX) 400 mg tablet Take 400 mg by mouth daily.   Yes Provider, Historical   dilTIAZem CD (CARDIZEM  CD) 180 mg ER capsule Take 1 Cap by mouth daily. 01/02/18  Yes Brock, Monique, PA-C   albuterol-ipratropium (DUO-NEB) 2.5 mg-0.5 mg/3 ml nebu 3 mL by Nebulization route every six (6) hours as needed.   Yes Provider, Historical   tiotropium bromide (SPIRIVA RESPIMAT) 2.5 mcg/actuation inhaler Take 1 Puff by inhalation daily. 10/11/17  Yes Holladay, Lorra Hals, MD   fluticasone propion-salmeterol (ADVAIR DISKUS) 100-50 mcg/dose diskus inhaler Take 1 Puff by inhalation two (2) times a day. 10/11/17  Yes Holladay, Lorra Hals, MD   rosuvastatin (CRESTOR) 5 mg tablet TAKE 1 TABLET BY MOUTH NIGHTLY 07/13/17  Yes Lake Bells, MD   polyethylene glycol (MIRALAX) 17 gram packet Take 1 Packet by mouth daily.  Patient taking differently: Take 17 g by mouth daily as needed for Constipation. 02/24/18   Mehmood, Seward Speck, MD   QUEtiapine SR (SEROQUEL XR) 150 mg sr tablet Take 150 mg by mouth nightly.    Provider, Historical   ondansetron (ZOFRAN ODT) 4 mg disintegrating tablet Take 1 Tab by mouth every eight (8) hours as needed for Nausea. Indications: nausea 12/24/17   Hilton Sinclair, PA-C   traZODone (DESYREL) 50 mg tablet Take 1 Tab by mouth nightly as needed for Sleep. 08/09/17   Einar Grad, MD       ALLERGIES:  Allergies   Allergen Reactions   ??? Chocolate [Cocoa] Sneezing     Confirms allergy but reports "I still eat it"       FHX:  Family History   Problem Relation Age of Onset   ??? Hypertension Mother    ??? Other Mother         OSA   ??? Cancer Father         suspected leukemia per pt's spouse   ??? Cancer Maternal Aunt     ??? Alcohol abuse Maternal Grandfather         SHX:  Social History     Socioeconomic History   ??? Marital status: MARRIED     Spouse name: Not on file   ??? Number of children: Not on file   ??? Years of education: Not on file   ??? Highest education level: Not on file   Occupational History   ??? Not on file   Social Needs   ??? Financial resource strain: Not on file   ??? Food insecurity:     Worry: Not on file     Inability: Not on file   ??? Transportation needs:     Medical: Not on file     Non-medical: Not on file   Tobacco Use   ??? Smoking status: Former Smoker   ??? Smokeless tobacco: Never Used   ??? Tobacco comment: reported as quit smoking around 1990s per spouse   Substance and Sexual Activity   ??? Alcohol use: No   ??? Drug use: Yes     Types: Marijuana   ??? Sexual activity: Never   Lifestyle   ??? Physical activity:     Days per week: Not on file     Minutes per session: Not on file   ??? Stress: Not on file   Relationships   ??? Social connections:     Talks on phone: Not on file     Gets together: Not on file     Attends religious service: Not on file     Active member of club or organization: Not on file     Attends meetings of clubs  or organizations: Not on file     Relationship status: Not on file   ??? Intimate partner violence:     Fear of current or ex partner: Not on file     Emotionally abused: Not on file     Physically abused: Not on file     Forced sexual activity: Not on file   Other Topics Concern   ??? Not on file   Social History Narrative   ??? Not on file         Review of Systems:  General ROS: Negative for fevers, chills, weight loss, generalized weakness  HEENT ROS: Negative for changes in hearing or vision, negative for sore throat, trouble swallowing  CV ROS: Negative for chest pain, palpitations, leg swelling  Pulm ROS: (+) for SOB, cough, sputum production, wheeze.   GI ROS: Negative for nausea/vomiting/diarrhea, abd pain  GU ROS: Negative for dysuria, hematuria  Musculoskeletal ROS: (+) for chronic R hip pain   Lymph/HEME ROS: Negative for easy bruising, easy bleeding.  Skin ROS: Negative for rash, erythema.  Neuro ROS: (+) for HA, chronic neuropathy in feet, (-)  For acute focal numbness, weakness, tingling, paresthesias, changes in hearing, vision, speech slurring    All other systems reviewed and negative.    Physical Exam:     Visit Vitals  BP (!) 155/109 (BP 1 Location: Left arm, BP Patient Position: Supine)   Pulse (!) 107   Temp 98.4 ??F (36.9 ??C)   Resp 16   Ht 5' 9"  (1.753 m)   Wt 85.3 kg (188 lb)   SpO2 99%   BMI 27.76 kg/m??    O2 Flow Rate (L/min): 2 l/min O2 Device: Nasal cannula    General: Alert, Overweight AAF, pleasant to conversation, in no acute distress, on 2L n/c O2.  HEENT: White sclera. PERRL, Oral Mucosa pink and moist.  Lymph: No palpable anterior cervical or supraclavicular lympadenopathy  CV: Regular rate and rhythm, no murmurs, rubs, gallops  Pulm: Lungs with very minimal wheeze, no crackles, rhonchi  GI: Soft, nontender, nondistended, normal bowel sounds  Extremity: No clubbing, cyanosis, no pitting lower leg edema, capillary refill time intact to distal fingertips  Skin: Warm, dry  Neuro: Cranial nerves II through XII intact to testing, Visual fields not tested.  5 out of 5 strength in the bilateral hands and feet. Grossly normal sensation at the distal limbs.        24 Hour Results:  Recent Results (from the past 72 hour(s))   CBC WITH AUTOMATED DIFF    Collection Time: 08/04/18 11:07 PM   Result Value Ref Range    WBC 16.9 (H) 4.0 - 11.0 1000/mm3    RBC 5.06 3.60 - 5.20 M/uL    HGB 14.2 13.0 - 17.2 gm/dl    HCT 43.1 37.0 - 50.0 %    MCV 85.2 80.0 - 98.0 fL    MCH 28.1 25.4 - 34.6 pg    MCHC 32.9 30.0 - 36.0 gm/dl    PLATELET 298 140 - 450 1000/mm3    MPV 11.3 (H) 6.0 - 10.0 fL    RDW-SD 40.6 36.4 - 46.3      NRBC 0 0 - 0      IMMATURE GRANULOCYTES 0.3 0.0 - 3.0 %    NEUTROPHILS 70.1 (H) 34 - 64 %    LYMPHOCYTES 21.6 (L) 28 - 48 %    MONOCYTES 6.7 1 - 13 %    EOSINOPHILS 0.8 0 - 5 %  BASOPHILS 0.5 0 - 3 %   METABOLIC PANEL, BASIC    Collection Time: 08/04/18 11:07 PM   Result Value Ref Range    Sodium 140 136 - 145 mEq/L    Potassium 3.4 (L) 3.5 - 5.1 mEq/L    Chloride 103 98 - 107 mEq/L    CO2 30 21 - 32 mEq/L    Glucose 145 (H) 74 - 106 mg/dl    BUN 21 7 - 25 mg/dl    Creatinine 1.0 0.6 - 1.3 mg/dl    GFR est AA >60.0      GFR est non-AA 59      Calcium 10.5 (H) 8.5 - 10.1 mg/dl    Anion gap 7 5 - 15 mmol/L   INFLUENZA A/B, BY PCR    Collection Time: 08/05/18  2:00 AM   Result Value Ref Range    Influenza A PCR NEGATIVE NEGATIVE      Influenza B PCR NEGATIVE NEGATIVE     GLUCOSE, POC    Collection Time: 08/05/18  8:20 AM   Result Value Ref Range    Glucose (POC) 171 (H) 65 - 105 mg/dL       Imaging:  CXR -2 view  INDICATION:  shortness of breath     ??  EXAMINATION:  XR CHEST PA LAT  ??  COMPARISON:  02/18/2018  ??  FINDINGS:  The study shows a normal sized heart.. Stable 2 cm nodule at the left lower  lobe. Known bilateral bullae..      ??  IMPRESSION  IMPRESSION:  ??  1. Stable 2 cm left lower lobe lung nodule.  2. Bullous emphysema.        Signed By: Marzella Schlein, MD    August 05, 2018    08:45

## 2018-08-05 NOTE — Other (Signed)
Bedside and Verbal shift change report given to Everest RN (oncoming nurse) by Trinidad RN (offgoing nurse). Report included the following information SBAR, Intake/Output, MAR and Recent Results.

## 2018-08-05 NOTE — ED Notes (Signed)
Pure wick placed on patient.

## 2018-08-05 NOTE — ED Provider Notes (Signed)
[facility]  Emergency Department Treatment Report        Patient: [name] [name] Age: 68 y.o. Sex: female    Date of Birth: 1951-03-06 Admit Date: 08/04/2018 PCP: Ernst Breach   MRN: [id number]  CSN: [id number]     Room: ER06/ER06 Time Dictated: 1:06 AM            Chief Complaint   Chief Complaint   Patient presents with   ??? Headache   ??? Hypertension       History of Present Illness   This is a 68 y.o. female who presents complaining of 2 days worth of lightheadedness, chills, right-sided headache, body aches.  She has had cough and shortness of breath.  She feels like she has been wheezing.  She feels like her headache is because of her elevated blood pressure.  She noticed a headache and lightheadedness first.  She then checked her blood pressure and it was elevated.  She rates her pain as a 10 out of 10.  She denies any neck stiffness or pain.  She has had headaches like this in the past when her blood pressure has been elevated.  She gets dyspnea on exertion.  She has a history of COPD.  She has increasing sputum and her cough.    Review of Systems   Review of Systems   Constitutional: Positive for chills and malaise/fatigue. Negative for fever.   HENT: Negative for ear pain and sore throat.    Eyes: Negative for blurred vision and pain.   Respiratory: Positive for cough, shortness of breath and wheezing. Negative for hemoptysis.    Cardiovascular: Negative for chest pain and palpitations.   Gastrointestinal: Positive for nausea. Negative for abdominal pain and vomiting.   Genitourinary: Negative for dysuria.   Musculoskeletal: Positive for myalgias. Negative for back pain and neck pain.   Skin: Negative for rash.   Neurological: Negative for sensory change and focal weakness.   Psychiatric/Behavioral: Negative for substance abuse.       Past Medical/Surgical History     Past Medical History:   Diagnosis Date   ??? Arthritis    ??? Chronic pain syndrome     related to R hip replacement    ??? COPD (chronic obstructive pulmonary disease) ([place])    ??? DM2 (diabetes mellitus, type 2) ([place])    ??? GERD (gastroesophageal reflux disease)    ??? Hepatitis C     HEP C   ??? HTN (hypertension)    ??? Lung nodule, multiple     (CT 10/2016) Small left upper lobe and 1.7 x 1.6 cm left lower lobe nodules not significantly changed from 07/23/2016 and 03/22/2016   ??? Marijuana use    ??? PTSD (post-traumatic stress disorder)     lived through the [place] in 1993   ??? Thoracic ascending aortic aneurysm ([place])     4.4cm noted on CT Chest (10/2016)   ??? Thyroid nodule     2.5 cm stable right thyroid nodule (CT 10/2016)     Past Surgical History:   Procedure Laterality Date   ??? HX ENDOSCOPY     ??? HX HIP REPLACEMENT Right 01/29/2015   ??? HX HIP REPLACEMENT     ??? HX HYSTERECTOMY  2010    hysterectomy         Social History     Social History     Socioeconomic History   ??? Marital status: MARRIED  Spouse name: Not on file   ??? Number of children: Not on file   ??? Years of education: Not on file   ??? Highest education level: Not on file   Occupational History   ??? Not on file   Social Needs   ??? Financial resource strain: Not on file   ??? Food insecurity:     Worry: Not on file     Inability: Not on file   ??? Transportation needs:     Medical: Not on file     Non-medical: Not on file   Tobacco Use   ??? Smoking status: Former Smoker   ??? Smokeless tobacco: Never Used   ??? Tobacco comment: reported as quit smoking around 1990s per spouse   Substance and Sexual Activity   ??? Alcohol use: No   ??? Drug use: Yes     Types: Marijuana     Comment: PTSD; last use last week as of 05/11/18   ??? Sexual activity: Never   Lifestyle   ??? Physical activity:     Days per week: Not on file     Minutes per session: Not on file   ??? Stress: Not on file   Relationships   ??? Social connections:     Talks on phone: Not on file     Gets together: Not on file     Attends religious service: Not on file     Active member of club or organization: Not on file      Attends meetings of clubs or organizations: Not on file     Relationship status: Not on file   ??? Intimate partner violence:     Fear of current or ex partner: Not on file     Emotionally abused: Not on file     Physically abused: Not on file     Forced sexual activity: Not on file   Other Topics Concern   ??? Not on file   Social History Narrative   ??? Not on file         Family History     Family History   Problem Relation Age of Onset   ??? Hypertension Mother    ??? Other Mother         OSA   ??? Cancer Father         suspected leukemia per pt's spouse   ??? Cancer Maternal Aunt    ??? Alcohol abuse Maternal Grandfather          Current Medications     Current Facility-Administered Medications   Medication Dose Route Frequency Provider Last Rate Last Dose   ??? azithromycin (ZITHROMAX) 500 mg in 0.9% sodium chloride 250 mL IVPB  500 mg IntraVENous NOW Lennart Pall, MD 250 mL/hr at 08/05/18 0153 500 mg at 08/05/18 0153     Current Outpatient Medications   Medication Sig Dispense Refill   ??? LANTUS SOLOSTAR U-100 INSULIN 100 unit/mL (3 mL) inpn INJECT 32 UNITS SUBCUTANEOUSLY  NIGHTLY 15 mL 0   ??? albuterol (ACCUNEB) 1.25 mg/3 mL nebu USE 1 VIAL IN NEBULIZER EVERY 6 HOURS AS NEEDED FOR  SHORTNESS  OF  BREATH,  WHEEZING 270 mL 1   ??? losartan (COZAAR) 25 mg tablet Take 0.5 Tabs by mouth daily. 30 Tab 2   ??? cloNIDine HCl (CATAPRES) 0.1 mg tablet Take 3 Tabs by mouth two (2) times a day. 60 Tab 1   ??? spironolactone (ALDACTONE) 100 mg tablet TAKE 1 TABLET  EVERY DAY 90 Tab 0   ??? nabumetone (RELAFEN) 750 mg tablet TAKE 1 TABLET BY MOUTH TWICE DAILY AS NEEDED FOR PAIN 30 Tab 0   ??? metFORMIN ER (GLUCOPHAGE XR) 500 mg tablet TAKE 1 TABLET TWICE DAILY 180 Tab 1   ??? montelukast (SINGULAIR) 10 mg tablet TAKE 1 TABLET EVERY DAY 90 Tab 3   ??? gabapentin (NEURONTIN) 300 mg capsule Take 1 Cap by mouth three (3) times daily. Max Daily Amount: 900 mg. 270 Cap 1   ??? polyethylene glycol (MIRALAX) 17 gram packet Take 1 Packet by mouth  daily. 30 Packet 1   ??? pantoprazole (PROTONIX) 40 mg tablet Take 1 Tab by mouth Daily (before breakfast). 30 Tab 0   ??? senna-docusate (PERICOLACE) 8.6-50 mg per tablet Take 1 Tab by mouth daily. 30 Tab 0   ??? magnesium oxide (MAG-OX) 400 mg tablet Take 400 mg by mouth daily.     ??? QUEtiapine SR (SEROQUEL XR) 150 mg sr tablet Take 150 mg by mouth nightly.     ??? Insulin Needles, Disposable, (NANO PEN NEEDLE) 32 gauge x 5/32" ndle To use with Lantus pen 100 Pen Needle 0   ??? dilTIAZem CD (CARDIZEM CD) 180 mg ER capsule Take 1 Cap by mouth daily. 90 Cap 1   ??? ondansetron (ZOFRAN ODT) 4 mg disintegrating tablet Take 1 Tab by mouth every eight (8) hours as needed for Nausea. Indications: nausea 12 Tab 0   ??? nabumetone (RELAFEN) 750 mg tablet Take 750 mg by mouth two (2) times a day.     ??? albuterol-ipratropium (DUO-NEB) 2.5 mg-0.5 mg/3 ml nebu 3 mL by Nebulization route every six (6) hours as needed.     ??? Blood Glucose Control High&Low (ACCU-CHEK AVIVA CONTROL SOLN) soln Check blood sugar daily 1 Bottle 0   ??? Blood-Glucose Meter (ACCU-CHEK AVIVA PLUS METER) misc Check blood sugar daily 1 Each 0   ??? glucose blood VI test strips (ACCU-CHEK AVIVA PLUS TEST STRP) strip Check blood sugar daily 100 Strip 5   ??? alcohol swabs (BD SINGLE USE SWABS REGULAR) padm Check blood sugar daily 100 Pad 5   ??? lancets (ACCU-CHEK SOFTCLIX LANCETS) misc Check blood sugar daily 100 Each 5   ??? tiotropium bromide (SPIRIVA RESPIMAT) 2.5 mcg/actuation inhaler Take 1 Puff by inhalation daily. 1 Inhaler 0   ??? fluticasone propion-salmeterol (ADVAIR DISKUS) 100-50 mcg/dose diskus inhaler Take 1 Puff by inhalation two (2) times a day. 1 Inhaler 0   ??? traZODone (DESYREL) 50 mg tablet Take 1 Tab by mouth nightly as needed for Sleep. 20 Tab 0   ??? rosuvastatin (CRESTOR) 5 mg tablet TAKE 1 TABLET BY MOUTH NIGHTLY 90 Tab 1   ??? triamcinolone (NASACORT) 55 mcg nasal inhaler 2 Sprays by Both Nostrils route daily. 1 Bottle 0    ??? Blood-Glucose Meter monitoring kit Check sugars 2 times per day, fasting and 2 hours after dinner 1 Kit 0   ??? Lancets misc Check sugars twice daily 200 Each 2   ??? glucose blood VI test strips (ASCENSIA AUTODISC VI, ONE TOUCH ULTRA TEST VI) strip Check sugars twice daily 100 Strip 5       Allergies     Allergies   Allergen Reactions   ??? Chocolate [Cocoa] Sneezing     Not an allergy       Physical Exam     Patient Vitals for the past 24 hrs:   Temp Pulse Resp BP SpO2   08/05/18 0206  98.7 ??F (37.1 ??C) ??? ??? ??? ???   08/05/18 0132 ??? 99 ??? (!) 157/95 90 %   08/05/18 0056 ??? 98 ??? (!) 181/117 96 %   08/05/18 0032 ??? 90 20 (!) 189/104 94 %   08/05/18 0002 ??? 93 22 (!) 187/114 95 %   08/04/18 2243 98.9 ??F (37.2 ??C) 93 20 (!) 161/128 93 %     Physical Exam  Constitutional:       General: She is in acute distress.   HENT:      Head: Normocephalic and atraumatic.      Right Ear: Tympanic membrane normal.      Left Ear: Tympanic membrane normal.      Nose: Nose normal.      Mouth/Throat:      Mouth: Mucous membranes are moist.   Eyes:      Pupils: Pupils are equal, round, and reactive to light.   Neck:      Musculoskeletal: Normal range of motion and neck supple.   Cardiovascular:      Rate and Rhythm: Normal rate and regular rhythm.      Pulses: Normal pulses.   Pulmonary:      Breath sounds: Wheezing present.      Comments: She has increased work of breathing, she is tachypneic, she has some fine expiratory wheezing.  Abdominal:      Palpations: Abdomen is soft.      Tenderness: There is no abdominal tenderness.   Musculoskeletal:      Right lower leg: No edema.      Left lower leg: No edema.   Skin:     General: Skin is warm and dry.   Neurological:      General: No focal deficit present.      Mental Status: She is alert and oriented to person, place, and time.   Psychiatric:      Comments: Somewhat anxious but appropriate          Impression and Management Plan    I suspect her headache is not related to her blood pressure but rather more likely related to an acute COPD exacerbation versus pneumonia.    We will treat this as well as her headache.    X-ray for pneumonia    Diagnostic Studies   Lab:   Results for orders placed or performed during the hospital encounter of 08/04/18   CBC WITH AUTOMATED DIFF   Result Value Ref Range    WBC 16.9 (H) 4.0 - 11.0 1000/mm3    RBC 5.06 3.60 - 5.20 M/uL    HGB 14.2 13.0 - 17.2 gm/dl    HCT 43.1 37.0 - 50.0 %    MCV 85.2 80.0 - 98.0 fL    MCH 28.1 25.4 - 34.6 pg    MCHC 32.9 30.0 - 36.0 gm/dl    PLATELET 298 140 - 450 1000/mm3    MPV 11.3 (H) 6.0 - 10.0 fL    RDW-SD 40.6 36.4 - 46.3      NRBC 0 0 - 0      IMMATURE GRANULOCYTES 0.3 0.0 - 3.0 %    NEUTROPHILS 70.1 (H) 34 - 64 %    LYMPHOCYTES 21.6 (L) 28 - 48 %    MONOCYTES 6.7 1 - 13 %    EOSINOPHILS 0.8 0 - 5 %    BASOPHILS 0.5 0 - 3 %   METABOLIC PANEL, BASIC   Result Value Ref Range    Sodium 140  136 - 145 mEq/L    Potassium 3.4 (L) 3.5 - 5.1 mEq/L    Chloride 103 98 - 107 mEq/L    CO2 30 21 - 32 mEq/L    Glucose 145 (H) 74 - 106 mg/dl    BUN 21 7 - 25 mg/dl    Creatinine 1.0 0.6 - 1.3 mg/dl    GFR est AA >60.0      GFR est non-AA 59      Calcium 10.5 (H) 8.5 - 10.1 mg/dl    Anion gap 7 5 - 15 mmol/L     Imaging:    No results found.    EKG:         ED Course   She was given inhaled bronchodilators, IV fluids, IV antibiotics, and medication for headache    Her chest x-ray, as interpreted by myself, shows chronic changes of bullous emphysema but also shows a lingular infiltrate.  She also has a stable pulmonary nodule.    She has a rising leukocytosis    Given her shortness of breath and infiltrate on x-ray I think it reasonable to admit her to the hospital for antibiotics and further observation            Medications   azithromycin (ZITHROMAX) 500 mg in 0.9% sodium chloride 250 mL IVPB (500 mg IntraVENous New Bag 08/05/18 0153)    sodium chloride 0.9 % bolus infusion 1,000 mL (0 mL IntraVENous IV Completed 08/05/18 0136)   dexamethasone (DECADRON) injection 10 mg (10 mg IntraVENous Given 08/04/18 2345)   albuterol-ipratropium (DUO-NEB) 2.5 MG-0.5 MG/3 ML (3 mL Nebulization Given 08/04/18 2332)   albuterol (PROVENTIL VENTOLIN) nebulizer solution 2.5 mg (2.5 mg Nebulization Given 08/04/18 2335)   diphenhydrAMINE (BENADRYL) injection 25 mg (25 mg IntraVENous Given 08/05/18 0100)   prochlorperazine (COMPAZINE) with saline injection 10 mg (10 mg IntraVENous Given 08/05/18 0100)   acetaminophen (TYLENOL) tablet 975 mg (975 mg Oral Given 08/05/18 0059)   cefTRIAXone (ROCEPHIN) 1 g in 0.9% sodium chloride (MBP/ADV) 50 mL MBP (1 g IntraVENous New Bag 08/05/18 0109)       Medical Decision Making   I have reviewed her records    I spoke with Dr. Dawson Bills who agrees to accept the patient in admission.  Final Diagnosis       ICD-10-CM ICD-9-CM   1. Community acquired bacterial pneumonia J15.9 482.9   2. COPD exacerbation ([place]) J44.1 491.21       Disposition   Admitted    Triana Coover A. Dennison Bulla, MD F-ACEP  August 05, 2018    My signature above authenticates this document and my orders, the final ??  diagnosis (es), discharge prescription (s), and instructions in the Epic ??  record.  If you have any questions please contact [phone].  ??  Nursing notes have been reviewed by the physician/ advanced practice ??  Clinician.

## 2018-08-05 NOTE — Progress Notes (Signed)
Bedside report given to [place] incoming nurse from [place], SBAR reviewed.  On RA, no acute distress, oxygen continued 2L nc. Spouse at bedside.    PAGER ID: [phone]   MESSAGE: Ms [name] [name] Rm 4208 BP 155/109 [id number].Takes cardiac meds at home and insulin.Can she get orders meds and you want glucomander?also DNR order.

## 2018-08-05 NOTE — Progress Notes (Signed)
Problem: Falls - Risk of  Goal: *Absence of Falls  Description  Document [name] [name] Fall Risk and appropriate interventions in the flowsheet.  Outcome: Progressing Towards Goal  Note: Fall Risk Interventions:  Mobility Interventions: Bed/chair exit alarm, Patient to call before getting OOB         Medication Interventions: Patient to call before getting OOB    Elimination Interventions: Call light in reach              Problem: Patient Education: Go to Patient Education Activity  Goal: Patient/Family Education  Outcome: Progressing Towards Goal     Problem: Risk for Spread of Infection  Goal: Prevent transmission of infectious organism to others  Description  Prevent the transmission of infectious organisms to other patients, staff members, and visitors.  Outcome: Progressing Towards Goal     Problem: Patient Education:  Go to Education Activity  Goal: Patient/Family Education  Outcome: Progressing Towards Goal     Problem: Gas Exchange - Impaired  Goal: *Absence of hypoxia  Outcome: Progressing Towards Goal     Problem: Pressure Injury - Risk of  Goal: *Prevention of pressure injury  Description  Document Braden Scale and appropriate interventions in the flowsheet.  Outcome: Progressing Towards Goal  Note: Pressure Injury Interventions:       Moisture Interventions: Internal/External urinary devices, Absorbent underpads, Apply protective barrier, creams and emollients    Activity Interventions: Increase time out of bed, Pressure redistribution bed/mattress(bed type)    Mobility Interventions: Pressure redistribution bed/mattress (bed type)    Nutrition Interventions: Document food/fluid/supplement intake    Friction and Shear Interventions: Apply protective barrier, creams and emollients                Problem: Patient Education: Go to Patient Education Activity  Goal: Patient/Family Education  Outcome: Progressing Towards Goal

## 2018-08-05 NOTE — H&P (Signed)
History & Physical    08/04/2018  Primary Care Provider: Wallene Huh, PA-C  Source of Information: Patient     Chief complaint: HA, cough and sputum        Assessment:    Active Problems:    COPD exacerbation ([place]) (11/14/2017)      Headache (08/05/2018)        Impression:   This is a 68 year old African female with past medical history of COPD with bullous emphysema, chronic pain, hypertension, insulin-dependent diabetes, who presents with a chief complaint primarily of headache also some symptoms of cough and sputum production but parallel timing of headache.  Reported history of headache with poorly controlled blood pressure which would seem to be the case on intake, she has no alarm symptoms for an acute intracranial process at this time.    Plan:    1. COPD Exacerbation:  -Status post dexamethasone and ceftriaxone, azithromycin IV in the emergency department  -However will hold off on continuing ceftriaxone given minimal wheeze and cough during exam, though noting elevated white count. Consider resuming if develops fever or worsening sputum production  -We will continue treatment with oral steroids, scheduled albuterol nebs, daily Spiriva  -Add Breo for home Advair, continue home Singulair  -We will continue p.o. azithromycin  -Check CBC tomorrow  -O2 support, wean as tolerated    2.  Headache  -Treat for hypertension as below  -We will check an ESR tomorrow a.m.  -If symptoms worsen or continue consider head CT, but will hold off on this time as no alarm symptoms  -Treat supportively with p.o. Fioricet and IV Compazine as needed  -PRN norco and IV morphine if needed for more severe pain.    3.  Hypertension:  -Continue home clonidine, diltiazem, losartan, Aldactone  -Monitor for improvement with headache with improvement in blood pressure  -We will have IV hydralazine if needed for markedly elevated blood pressures.    4.  Diabetes:  -A1c of 6 earlier this month  -We will order Glucomander at standard regimen  given steroid use  -Holding home metformin    5.  Peripheral neuropathy:  -Continue home gabapentin    6.  Chronic right hip pain  -Does not appear to be on a scheduled medication for this at home  -Spouse reports no longer takes nabumetone    7.  Marijuana abuse:  -Counseled on cessation given COPD    8.  PTSD:  -Continue home Seroquel nightly  -Trazodone nightly as needed    9.  Hyperlipidemia:  -Continue home Crestor    Prophylaxis: Lovenox subcu daily    CODE STATUS: DNR.  The patient does confirm DNR status twice when asked.  Spouse reports that she has a DNR on file but that he would suggest a trial of mechanical ventilation however patient is consistent in her request for DNR.        History of Presenting Illness:  [name] [name] is a 68 y.o. 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  She reports headache is her chief complaint and reports this is been intractable, with a pounding sensation originally on the left side but now more on the right side.  She states this  typically is worse when her blood pressure is elevated.  Blood pressures were intake were up to the 093A systolic.  However she denies any change in hearing, vision, or any acute numbness or weakness.  Although she reports chronic numbness and tingling at the toes bilaterally, for which she states she takes gabapentin.  She also reports some cough, wheeze, and white sputum production for the last 3 to 4 days as well.  She reports headache is unchanged after initial treatment in the emergency department including IV Compazine and Benadryl.  No other exacerbating or relieving factors are identified.  She denies any fevers, chills, nausea, vomiting, changes in urinary  habits.  She is asked to be admitted for further evaluation and treatment.      PMHX:  Past Medical History:   Diagnosis Date   ??? Arthritis    ??? 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 DM2 (diabetes mellitus, type 2) ([place])    ??? GERD (gastroesophageal reflux disease)    ??? Hepatitis C     HEP C   ??? HTN (hypertension)    ??? Lung nodule, multiple     (CT 10/2016) Small left upper lobe and 1.7 x 1.6 cm left lower lobe nodules not significantly changed from 07/23/2016 and 03/22/2016   ??? Marijuana use    ??? PTSD (post-traumatic stress disorder)     lived through the [place] in 1993   ??? Thoracic ascending aortic aneurysm ([place])     4.4cm noted on CT Chest (10/2016)   ??? Thyroid nodule     2.5 cm stable right thyroid nodule (CT 10/2016)        PSHX:  Past Surgical History:   Procedure Laterality Date   ??? HX ENDOSCOPY     ??? HX HERNIA REPAIR  2019    x2   ??? HX HIP REPLACEMENT Right 01/29/2015   ??? HX HIP REPLACEMENT     ??? HX HYSTERECTOMY  2010    hysterectomy       MEDICATIONS:  Prior to Admission medications    Medication Sig Start Date End Date Taking? Authorizing Provider   LANTUS SOLOSTAR U-100 INSULIN 100 unit/mL (3 mL) inpn INJECT 32 UNITS SUBCUTANEOUSLY  NIGHTLY 06/12/18  Yes Brock, Monique, PA-C   albuterol (ACCUNEB) 1.25 mg/3 mL nebu USE 1 VIAL IN NEBULIZER EVERY 6 HOURS AS NEEDED FOR  SHORTNESS  OF  BREATH,  WHEEZING 05/30/18  Yes Fransisco Beau, Monique, PA-C   losartan (COZAAR) 25 mg tablet Take 0.5 Tabs by mouth daily. 05/20/18  Yes Wallene Huh, PA-C   cloNIDine HCl (CATAPRES) 0.1 mg tablet Take 3 Tabs by mouth two (2) times a day. 05/20/18  Yes Wallene Huh, PA-C   spironolactone (ALDACTONE) 100 mg tablet TAKE 1 TABLET EVERY DAY 04/27/18  Yes Wallene Huh, PA-C   metFORMIN ER (GLUCOPHAGE XR) 500 mg tablet TAKE 1 TABLET TWICE DAILY 03/30/18  Yes Fransisco Beau, Monique, PA-C   montelukast (SINGULAIR) 10 mg  tablet TAKE 1 TABLET EVERY DAY 03/26/18  Yes Wallene Huh, PA-C   gabapentin (NEURONTIN) 300 mg capsule Take 1 Cap by mouth three (3) times daily. Max Daily Amount: 900 mg. 03/01/18  Yes Fransisco Beau, Monique, PA-C   magnesium oxide (MAG-OX) 400 mg tablet Take 400 mg by mouth daily.   Yes Provider, Historical   dilTIAZem CD (CARDIZEM  CD) 180 mg ER capsule Take 1 Cap by mouth daily. 01/02/18  Yes Brock, Monique, PA-C   albuterol-ipratropium (DUO-NEB) 2.5 mg-0.5 mg/3 ml nebu 3 mL by Nebulization route every six (6) hours as needed.   Yes Provider, Historical   tiotropium bromide (SPIRIVA RESPIMAT) 2.5 mcg/actuation inhaler Take 1 Puff by inhalation daily. 10/11/17  Yes Holladay, Lorra Hals, MD   fluticasone propion-salmeterol (ADVAIR DISKUS) 100-50 mcg/dose diskus inhaler Take 1 Puff by inhalation two (2) times a day. 10/11/17  Yes Holladay, Lorra Hals, MD   rosuvastatin (CRESTOR) 5 mg tablet TAKE 1 TABLET BY MOUTH NIGHTLY 07/13/17  Yes Lake Bells, MD   polyethylene glycol (MIRALAX) 17 gram packet Take 1 Packet by mouth daily.  Patient taking differently: Take 17 g by mouth daily as needed for Constipation. 02/24/18   Mehmood, Seward Speck, MD   QUEtiapine SR (SEROQUEL XR) 150 mg sr tablet Take 150 mg by mouth nightly.    Provider, Historical   ondansetron (ZOFRAN ODT) 4 mg disintegrating tablet Take 1 Tab by mouth every eight (8) hours as needed for Nausea. Indications: nausea 12/24/17   Hilton Sinclair, PA-C   traZODone (DESYREL) 50 mg tablet Take 1 Tab by mouth nightly as needed for Sleep. 08/09/17   Einar Grad, MD       ALLERGIES:  Allergies   Allergen Reactions   ??? Chocolate [Cocoa] Sneezing     Confirms allergy but reports "I still eat it"       FHX:  Family History   Problem Relation Age of Onset   ??? Hypertension Mother    ??? Other Mother         OSA   ??? Cancer Father         suspected leukemia per pt's spouse   ??? Cancer Maternal Aunt    ??? Alcohol abuse Maternal Grandfather         SHX:  Social History     Socioeconomic  History   ??? Marital status: MARRIED     Spouse name: Not on file   ??? Number of children: Not on file   ??? Years of education: Not on file   ??? Highest education level: Not on file   Occupational History   ??? Not on file   Social Needs   ??? Financial resource strain: Not on file   ??? Food insecurity:     Worry: Not on file     Inability: Not on file   ??? Transportation needs:     Medical: Not on file     Non-medical: Not on file   Tobacco Use   ??? Smoking status: Former Smoker   ??? Smokeless tobacco: Never Used   ??? Tobacco comment: reported as quit smoking around 1990s per spouse   Substance and Sexual Activity   ??? Alcohol use: No   ??? Drug use: Yes     Types: Marijuana   ??? Sexual activity: Never   Lifestyle   ??? Physical activity:     Days per week: Not on file     Minutes per session: Not on file   ??? Stress: Not on file   Relationships   ??? Social connections:     Talks on phone: Not on file     Gets together: Not on file     Attends religious service: Not on file     Active member of club or organization: Not on file     Attends meetings of clubs  or organizations: Not on file     Relationship status: Not on file   ??? Intimate partner violence:     Fear of current or ex partner: Not on file     Emotionally abused: Not on file     Physically abused: Not on file     Forced sexual activity: Not on file   Other Topics Concern   ??? Not on file   Social History Narrative   ??? Not on file         Review of Systems:  General ROS: Negative for fevers, chills, weight loss, generalized weakness  HEENT ROS: Negative for changes in hearing or vision, negative for sore throat, trouble swallowing  CV ROS: Negative for chest pain, palpitations, leg swelling  Pulm ROS: (+) for SOB, cough, sputum production, wheeze.   GI ROS: Negative for nausea/vomiting/diarrhea, abd pain  GU ROS: Negative for dysuria, hematuria  Musculoskeletal ROS: (+) for chronic R hip pain  Lymph/HEME ROS: Negative for easy bruising, easy bleeding.  Skin ROS: Negative for  rash, erythema.  Neuro ROS: (+) for HA, chronic neuropathy in feet, (-)  For acute focal numbness, weakness, tingling, paresthesias, changes in hearing, vision, speech slurring    All other systems reviewed and negative.    Physical Exam:     Visit Vitals  BP (!) 155/109 (BP 1 Location: Left arm, BP Patient Position: Supine)   Pulse (!) 107   Temp 98.4 ??F (36.9 ??C)   Resp 16   Ht '5\' 9"'  (1.753 m)   Wt 85.3 kg (188 lb)   SpO2 99%   BMI 27.76 kg/m??    O2 Flow Rate (L/min): 2 l/min O2 Device: Nasal cannula    General: Alert, Overweight AAF, pleasant to conversation, in no acute distress, on 2L n/c O2.  HEENT: White sclera. PERRL, Oral Mucosa pink and moist.  Lymph: No palpable anterior cervical or supraclavicular lympadenopathy  CV: Regular rate and rhythm, no murmurs, rubs, gallops  Pulm: Lungs with very minimal wheeze, no crackles, rhonchi  GI: Soft, nontender, nondistended, normal bowel sounds  Extremity: No clubbing, cyanosis, no pitting lower leg edema, capillary refill time intact to distal fingertips  Skin: Warm, dry  Neuro: Cranial nerves II through XII intact to testing, Visual fields not tested.  5 out of 5 strength in the bilateral hands and feet. Grossly normal sensation at the distal limbs.        24 Hour Results:  Recent Results (from the past 72 hour(s))   CBC WITH AUTOMATED DIFF    Collection Time: 08/04/18 11:07 PM   Result Value Ref Range    WBC 16.9 (H) 4.0 - 11.0 1000/mm3    RBC 5.06 3.60 - 5.20 M/uL    HGB 14.2 13.0 - 17.2 gm/dl    HCT 43.1 37.0 - 50.0 %    MCV 85.2 80.0 - 98.0 fL    MCH 28.1 25.4 - 34.6 pg    MCHC 32.9 30.0 - 36.0 gm/dl    PLATELET 298 140 - 450 1000/mm3    MPV 11.3 (H) 6.0 - 10.0 fL    RDW-SD 40.6 36.4 - 46.3      NRBC 0 0 - 0      IMMATURE GRANULOCYTES 0.3 0.0 - 3.0 %    NEUTROPHILS 70.1 (H) 34 - 64 %    LYMPHOCYTES 21.6 (L) 28 - 48 %    MONOCYTES 6.7 1 - 13 %    EOSINOPHILS 0.8 0 - 5 %  BASOPHILS 0.5 0 - 3 %   METABOLIC PANEL, BASIC    Collection Time: 08/04/18 11:07 PM    Result Value Ref Range    Sodium 140 136 - 145 mEq/L    Potassium 3.4 (L) 3.5 - 5.1 mEq/L    Chloride 103 98 - 107 mEq/L    CO2 30 21 - 32 mEq/L    Glucose 145 (H) 74 - 106 mg/dl    BUN 21 7 - 25 mg/dl    Creatinine 1.0 0.6 - 1.3 mg/dl    GFR est AA >60.0      GFR est non-AA 59      Calcium 10.5 (H) 8.5 - 10.1 mg/dl    Anion gap 7 5 - 15 mmol/L   INFLUENZA A/B, BY PCR    Collection Time: 08/05/18  2:00 AM   Result Value Ref Range    Influenza A PCR NEGATIVE NEGATIVE      Influenza B PCR NEGATIVE NEGATIVE     GLUCOSE, POC    Collection Time: 08/05/18  8:20 AM   Result Value Ref Range    Glucose (POC) 171 (H) 65 - 105 mg/dL       Imaging:  CXR -2 view  INDICATION:  shortness of breath     ??  EXAMINATION:  XR CHEST PA LAT  ??  COMPARISON:  02/18/2018  ??  FINDINGS:  The study shows a normal sized heart.. Stable 2 cm nodule at the left lower  lobe. Known bilateral bullae..      ??  IMPRESSION  IMPRESSION:  ??  1. Stable 2 cm left lower lobe lung nodule.  2. Bullous emphysema.        Signed By: Marzella Schlein, MD    August 05, 2018    08:45

## 2018-08-05 NOTE — ED Notes (Signed)
TRANSFER - OUT REPORT:    Verbal report given to [place], Charity fundraiser (name) on LAVASHA STACHE  being transferred to [facility] (unit) for routine progression of care       Report consisted of patient's Situation, Background, Assessment and   Recommendations(SBAR).     Information from the following report(s) SBAR was reviewed with the receiving nurse.    Lines:   Peripheral IV 08/04/18 Right;Upper Arm (Active)   Site Assessment Clean, dry, & intact 08/04/2018 11:07 PM   Phlebitis Assessment 0 08/04/2018 11:07 PM   Infiltration Assessment 0 08/04/2018 11:07 PM   Dressing Status Clean, dry, & intact 08/04/2018 11:07 PM   Dressing Type Transparent 08/04/2018 11:07 PM   Hub Color/Line Status Pink;Flushed;Patent 08/04/2018 11:07 PM   Action Taken Blood drawn 08/04/2018 11:07 PM   Alcohol Cap Used Yes 08/04/2018 11:07 PM        Opportunity for questions and clarification was provided.      Patient transported with:   Registered Nurse

## 2018-08-05 NOTE — ED Notes (Signed)
Patient urinated all over herself and the bed. This nurse cleaned patient, changed patient, and the bed. Patient restarted on monitor and placed back into the bed with clean linen. Patient has decided that she would like to stay in the hospital as per her conversation with Dr Hiram Comber. Dr Hiram Comber is aware.

## 2018-08-05 NOTE — ED Provider Notes (Signed)
[facility]  Emergency Department Treatment Report        Patient: [name] [name] Age: 68 y.o. Sex: female    Date of Birth: March 01, 1951 Admit Date: 08/04/2018 PCP: Ernst Breach   MRN: [id number]  CSN: [id number]     Room: ER06/ER06 Time Dictated: 1:06 AM            Chief Complaint   Chief Complaint   Patient presents with   ??? Headache   ??? Hypertension       History of Present Illness   This is a 68 y.o. female who presents complaining of 2 days worth of lightheadedness, chills, right-sided headache, body aches.  She has had cough and shortness of breath.  She feels like she has been wheezing.  She feels like her headache is because of her elevated blood pressure.  She noticed a headache and lightheadedness first.  She then checked her blood pressure and it was elevated.  She rates her pain as a 10 out of 10.  She denies any neck stiffness or pain.  She has had headaches like this in the past when her blood pressure has been elevated.  She gets dyspnea on exertion.  She has a history of COPD.  She has increasing sputum and her cough.    Review of Systems   Review of Systems   Constitutional: Positive for chills and malaise/fatigue. Negative for fever.   HENT: Negative for ear pain and sore throat.    Eyes: Negative for blurred vision and pain.   Respiratory: Positive for cough, shortness of breath and wheezing. Negative for hemoptysis.    Cardiovascular: Negative for chest pain and palpitations.   Gastrointestinal: Positive for nausea. Negative for abdominal pain and vomiting.   Genitourinary: Negative for dysuria.   Musculoskeletal: Positive for myalgias. Negative for back pain and neck pain.   Skin: Negative for rash.   Neurological: Negative for sensory change and focal weakness.   Psychiatric/Behavioral: Negative for substance abuse.       Past Medical/Surgical History     Past Medical History:   Diagnosis Date   ??? Arthritis    ??? 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  COPD (chronic obstructive pulmonary disease) ([employer])    ??? DM2 (diabetes mellitus, type 2) ([place])    ??? GERD (gastroesophageal reflux disease)    ??? Hepatitis C     HEP C   ??? HTN (hypertension)    ??? Lung nodule, multiple     (CT 10/2016) Small left upper lobe and 1.7 x 1.6 cm left lower lobe nodules not significantly changed from 07/23/2016 and 03/22/2016   ??? Marijuana use    ??? PTSD (post-traumatic stress disorder)     lived through the [place] in 1993   ??? Thoracic ascending aortic aneurysm ([place])     4.4cm noted on CT Chest (10/2016)   ??? Thyroid nodule     2.5 cm stable right thyroid nodule (CT 10/2016)     Past Surgical History:   Procedure Laterality Date   ??? HX ENDOSCOPY     ??? HX HIP REPLACEMENT Right 01/29/2015   ??? HX HIP REPLACEMENT     ??? HX HYSTERECTOMY  2010    hysterectomy         Social History     Social History     Socioeconomic History   ??? Marital status: MARRIED  Spouse name: Not on file   ??? Number of children: Not on file   ??? Years of education: Not on file   ??? Highest education level: Not on file   Occupational History   ??? Not on file   Social Needs   ??? Financial resource strain: Not on file   ??? Food insecurity:     Worry: Not on file     Inability: Not on file   ??? Transportation needs:     Medical: Not on file     Non-medical: Not on file   Tobacco Use   ??? Smoking status: Former Smoker   ??? Smokeless tobacco: Never Used   ??? Tobacco comment: reported as quit smoking around 1990s per spouse   Substance and Sexual Activity   ??? Alcohol use: No   ??? Drug use: Yes     Types: Marijuana     Comment: PTSD; last use last week as of 05/11/18   ??? Sexual activity: Never   Lifestyle   ??? Physical activity:     Days per week: Not on file     Minutes per session: Not on file   ??? Stress: Not on file   Relationships   ??? Social connections:     Talks on phone: Not on file     Gets together: Not on file     Attends religious service: Not on file     Active member of club or organization: Not on file     Attends  meetings of clubs or organizations: Not on file     Relationship status: Not on file   ??? Intimate partner violence:     Fear of current or ex partner: Not on file     Emotionally abused: Not on file     Physically abused: Not on file     Forced sexual activity: Not on file   Other Topics Concern   ??? Not on file   Social History Narrative   ??? Not on file         Family History     Family History   Problem Relation Age of Onset   ??? Hypertension Mother    ??? Other Mother         OSA   ??? Cancer Father         suspected leukemia per pt's spouse   ??? Cancer Maternal Aunt    ??? Alcohol abuse Maternal Grandfather          Current Medications     Current Facility-Administered Medications   Medication Dose Route Frequency Provider Last Rate Last Dose   ??? azithromycin (ZITHROMAX) 500 mg in 0.9% sodium chloride 250 mL IVPB  500 mg IntraVENous NOW Lennart Pall, MD 250 mL/hr at 08/05/18 0153 500 mg at 08/05/18 0153     Current Outpatient Medications   Medication Sig Dispense Refill   ??? LANTUS SOLOSTAR U-100 INSULIN 100 unit/mL (3 mL) inpn INJECT 32 UNITS SUBCUTANEOUSLY  NIGHTLY 15 mL 0   ??? albuterol (ACCUNEB) 1.25 mg/3 mL nebu USE 1 VIAL IN NEBULIZER EVERY 6 HOURS AS NEEDED FOR  SHORTNESS  OF  BREATH,  WHEEZING 270 mL 1   ??? losartan (COZAAR) 25 mg tablet Take 0.5 Tabs by mouth daily. 30 Tab 2   ??? cloNIDine HCl (CATAPRES) 0.1 mg tablet Take 3 Tabs by mouth two (2) times a day. 60 Tab 1   ??? spironolactone (ALDACTONE) 100 mg tablet TAKE 1 TABLET  EVERY DAY 90 Tab 0   ??? nabumetone (RELAFEN) 750 mg tablet TAKE 1 TABLET BY MOUTH TWICE DAILY AS NEEDED FOR PAIN 30 Tab 0   ??? metFORMIN ER (GLUCOPHAGE XR) 500 mg tablet TAKE 1 TABLET TWICE DAILY 180 Tab 1   ??? montelukast (SINGULAIR) 10 mg tablet TAKE 1 TABLET EVERY DAY 90 Tab 3   ??? gabapentin (NEURONTIN) 300 mg capsule Take 1 Cap by mouth three (3) times daily. Max Daily Amount: 900 mg. 270 Cap 1   ??? polyethylene glycol (MIRALAX) 17 gram packet Take 1 Packet by mouth daily. 30 Packet 1   ???  pantoprazole (PROTONIX) 40 mg tablet Take 1 Tab by mouth Daily (before breakfast). 30 Tab 0   ??? senna-docusate (PERICOLACE) 8.6-50 mg per tablet Take 1 Tab by mouth daily. 30 Tab 0   ??? magnesium oxide (MAG-OX) 400 mg tablet Take 400 mg by mouth daily.     ??? QUEtiapine SR (SEROQUEL XR) 150 mg sr tablet Take 150 mg by mouth nightly.     ??? Insulin Needles, Disposable, (NANO PEN NEEDLE) 32 gauge x 5/32" ndle To use with Lantus pen 100 Pen Needle 0   ??? dilTIAZem CD (CARDIZEM CD) 180 mg ER capsule Take 1 Cap by mouth daily. 90 Cap 1   ??? ondansetron (ZOFRAN ODT) 4 mg disintegrating tablet Take 1 Tab by mouth every eight (8) hours as needed for Nausea. Indications: nausea 12 Tab 0   ??? nabumetone (RELAFEN) 750 mg tablet Take 750 mg by mouth two (2) times a day.     ??? albuterol-ipratropium (DUO-NEB) 2.5 mg-0.5 mg/3 ml nebu 3 mL by Nebulization route every six (6) hours as needed.     ??? Blood Glucose Control High&Low (ACCU-CHEK AVIVA CONTROL SOLN) soln Check blood sugar daily 1 Bottle 0   ??? Blood-Glucose Meter (ACCU-CHEK AVIVA PLUS METER) misc Check blood sugar daily 1 Each 0   ??? glucose blood VI test strips (ACCU-CHEK AVIVA PLUS TEST STRP) strip Check blood sugar daily 100 Strip 5   ??? alcohol swabs (BD SINGLE USE SWABS REGULAR) padm Check blood sugar daily 100 Pad 5   ??? lancets (ACCU-CHEK SOFTCLIX LANCETS) misc Check blood sugar daily 100 Each 5   ??? tiotropium bromide (SPIRIVA RESPIMAT) 2.5 mcg/actuation inhaler Take 1 Puff by inhalation daily. 1 Inhaler 0   ??? fluticasone propion-salmeterol (ADVAIR DISKUS) 100-50 mcg/dose diskus inhaler Take 1 Puff by inhalation two (2) times a day. 1 Inhaler 0   ??? traZODone (DESYREL) 50 mg tablet Take 1 Tab by mouth nightly as needed for Sleep. 20 Tab 0   ??? rosuvastatin (CRESTOR) 5 mg tablet TAKE 1 TABLET BY MOUTH NIGHTLY 90 Tab 1   ??? triamcinolone (NASACORT) 55 mcg nasal inhaler 2 Sprays by Both Nostrils route daily. 1 Bottle 0   ??? Blood-Glucose Meter monitoring kit Check sugars 2 times per  day, fasting and 2 hours after dinner 1 Kit 0   ??? Lancets misc Check sugars twice daily 200 Each 2   ??? glucose blood VI test strips (ASCENSIA AUTODISC VI, ONE TOUCH ULTRA TEST VI) strip Check sugars twice daily 100 Strip 5       Allergies     Allergies   Allergen Reactions   ??? Chocolate [Cocoa] Sneezing     Not an allergy       Physical Exam     Patient Vitals for the past 24 hrs:   Temp Pulse Resp BP SpO2   08/05/18 0206  98.7 ??F (37.1 ??C) ??? ??? ??? ???   08/05/18 0132 ??? 99 ??? (!) 157/95 90 %   08/05/18 0056 ??? 98 ??? (!) 181/117 96 %   08/05/18 0032 ??? 90 20 (!) 189/104 94 %   08/05/18 0002 ??? 93 22 (!) 187/114 95 %   08/04/18 2243 98.9 ??F (37.2 ??C) 93 20 (!) 161/128 93 %     Physical Exam  Constitutional:       General: She is in acute distress.   HENT:      Head: Normocephalic and atraumatic.      Right Ear: Tympanic membrane normal.      Left Ear: Tympanic membrane normal.      Nose: Nose normal.      Mouth/Throat:      Mouth: Mucous membranes are moist.   Eyes:      Pupils: Pupils are equal, round, and reactive to light.   Neck:      Musculoskeletal: Normal range of motion and neck supple.   Cardiovascular:      Rate and Rhythm: Normal rate and regular rhythm.      Pulses: Normal pulses.   Pulmonary:      Breath sounds: Wheezing present.      Comments: She has increased work of breathing, she is tachypneic, she has some fine expiratory wheezing.  Abdominal:      Palpations: Abdomen is soft.      Tenderness: There is no abdominal tenderness.   Musculoskeletal:      Right lower leg: No edema.      Left lower leg: No edema.   Skin:     General: Skin is warm and dry.   Neurological:      General: No focal deficit present.      Mental Status: She is alert and oriented to person, place, and time.   Psychiatric:      Comments: Somewhat anxious but appropriate          Impression and Management Plan   I suspect her headache is not related to her blood pressure but rather more likely related to an acute COPD exacerbation versus  pneumonia.    We will treat this as well as her headache.    X-ray for pneumonia    Diagnostic Studies   Lab:   Results for orders placed or performed during the hospital encounter of 08/04/18   CBC WITH AUTOMATED DIFF   Result Value Ref Range    WBC 16.9 (H) 4.0 - 11.0 1000/mm3    RBC 5.06 3.60 - 5.20 M/uL    HGB 14.2 13.0 - 17.2 gm/dl    HCT 43.1 37.0 - 50.0 %    MCV 85.2 80.0 - 98.0 fL    MCH 28.1 25.4 - 34.6 pg    MCHC 32.9 30.0 - 36.0 gm/dl    PLATELET 298 140 - 450 1000/mm3    MPV 11.3 (H) 6.0 - 10.0 fL    RDW-SD 40.6 36.4 - 46.3      NRBC 0 0 - 0      IMMATURE GRANULOCYTES 0.3 0.0 - 3.0 %    NEUTROPHILS 70.1 (H) 34 - 64 %    LYMPHOCYTES 21.6 (L) 28 - 48 %    MONOCYTES 6.7 1 - 13 %    EOSINOPHILS 0.8 0 - 5 %    BASOPHILS 0.5 0 - 3 %   METABOLIC PANEL, BASIC   Result Value Ref Range    Sodium 140  136 - 145 mEq/L    Potassium 3.4 (L) 3.5 - 5.1 mEq/L    Chloride 103 98 - 107 mEq/L    CO2 30 21 - 32 mEq/L    Glucose 145 (H) 74 - 106 mg/dl    BUN 21 7 - 25 mg/dl    Creatinine 1.0 0.6 - 1.3 mg/dl    GFR est AA >60.0      GFR est non-AA 59      Calcium 10.5 (H) 8.5 - 10.1 mg/dl    Anion gap 7 5 - 15 mmol/L     Imaging:    No results found.    EKG:         ED Course   She was given inhaled bronchodilators, IV fluids, IV antibiotics, and medication for headache    Her chest x-ray, as interpreted by myself, shows chronic changes of bullous emphysema but also shows a lingular infiltrate.  She also has a stable pulmonary nodule.    She has a rising leukocytosis    Given her shortness of breath and infiltrate on x-ray I think it reasonable to admit her to the hospital for antibiotics and further observation            Medications   azithromycin (ZITHROMAX) 500 mg in 0.9% sodium chloride 250 mL IVPB (500 mg IntraVENous New Bag 08/05/18 0153)   sodium chloride 0.9 % bolus infusion 1,000 mL (0 mL IntraVENous IV Completed 08/05/18 0136)   dexamethasone (DECADRON) injection 10 mg (10 mg IntraVENous Given 08/04/18 2345)    albuterol-ipratropium (DUO-NEB) 2.5 MG-0.5 MG/3 ML (3 mL Nebulization Given 08/04/18 2332)   albuterol (PROVENTIL VENTOLIN) nebulizer solution 2.5 mg (2.5 mg Nebulization Given 08/04/18 2335)   diphenhydrAMINE (BENADRYL) injection 25 mg (25 mg IntraVENous Given 08/05/18 0100)   prochlorperazine (COMPAZINE) with saline injection 10 mg (10 mg IntraVENous Given 08/05/18 0100)   acetaminophen (TYLENOL) tablet 975 mg (975 mg Oral Given 08/05/18 0059)   cefTRIAXone (ROCEPHIN) 1 g in 0.9% sodium chloride (MBP/ADV) 50 mL MBP (1 g IntraVENous New Bag 08/05/18 0109)       Medical Decision Making   I have reviewed her records    I spoke with Dr. Dawson Bills who agrees to accept the patient in admission.  Final Diagnosis       ICD-10-CM ICD-9-CM   1. Community acquired bacterial pneumonia J15.9 482.9   2. COPD exacerbation ([place]) J44.1 491.21       Disposition   Admitted    Natasia Sanko A. Dennison Bulla, MD F-ACEP  August 05, 2018    My signature above authenticates this document and my orders, the final ??  diagnosis (es), discharge prescription (s), and instructions in the Epic ??  record.  If you have any questions please contact [phone].  ??  Nursing notes have been reviewed by the physician/ advanced practice ??  Clinician.

## 2018-08-06 LAB — BASIC METABOLIC PANEL
Anion Gap: 4 mmol/L — ABNORMAL LOW (ref 5–15)
BUN: 21 mg/dl (ref 7–25)
CO2: 29 mEq/L (ref 21–32)
Calcium: 8.8 mg/dl (ref 8.5–10.1)
Chloride: 106 mEq/L (ref 98–107)
Creatinine: 1 mg/dl (ref 0.6–1.3)
EGFR IF NonAfrican American: 59
GFR African American: >60
Glucose: 179 mg/dl — ABNORMAL HIGH (ref 74–106)
Potassium: 4 mEq/L (ref 3.5–5.1)
Sodium: 139 mEq/L (ref 136–145)

## 2018-08-06 LAB — CBC WITH AUTO DIFFERENTIAL
Basophils %: 0.1 % (ref 0–3)
Eosinophils %: 0 % (ref 0–5)
Hematocrit: 36.6 % — ABNORMAL LOW (ref 37.0–50.0)
Hemoglobin: 12.2 gm/dl — ABNORMAL LOW (ref 13.0–17.2)
Immature Granulocytes: 0.4 % (ref 0.0–3.0)
Lymphocytes %: 13.8 % — ABNORMAL LOW (ref 28–48)
MCH: 28.6 pg (ref 25.4–34.6)
MCHC: 33.3 gm/dl (ref 30.0–36.0)
MCV: 85.9 fL (ref 80.0–98.0)
MPV: 11.6 fL — ABNORMAL HIGH (ref 6.0–10.0)
Monocytes %: 10.1 % (ref 1–13)
Neutrophils %: 75.6 % — ABNORMAL HIGH (ref 34–64)
Nucleated RBCs: 0 (ref 0–0)
Platelets: 213 10*3/uL (ref 140–450)
RBC: 4.26 M/uL (ref 3.60–5.20)
RDW-SD: 41.1 (ref 36.4–46.3)
WBC: 14.1 10*3/uL — ABNORMAL HIGH (ref 4.0–11.0)

## 2018-08-06 LAB — SEDIMENTATION RATE: Sed Rate: 35 mm/Hr — ABNORMAL HIGH (ref 0–20)

## 2018-08-06 LAB — POCT GLUCOSE
POC Glucose: 281 mg/dL — ABNORMAL HIGH (ref 65–105)
POC Glucose: 185 mg/dL — ABNORMAL HIGH (ref 65–105)
POC Glucose: 132 mg/dL — ABNORMAL HIGH (ref 65–105)
POC Glucose: 229 mg/dL — ABNORMAL HIGH (ref 65–105)

## 2018-08-06 LAB — C-REACTIVE PROTEIN: CRP: <2.9 mg/L (ref 0.0–2.9)

## 2018-08-06 LAB — METABOLIC PANEL, BASIC
Anion gap: 4 mmol/L — ABNORMAL LOW (ref 5–15)
BUN: 21 mg/dl (ref 7–25)
CO2: 29 mEq/L (ref 21–32)
Calcium: 8.8 mg/dl (ref 8.5–10.1)
Chloride: 106 mEq/L (ref 98–107)
Creatinine: 1 mg/dl (ref 0.6–1.3)
GFR est AA: >60
GFR est non-AA: 59
Glucose: 179 mg/dl — ABNORMAL HIGH (ref 74–106)
Potassium: 4 mEq/L (ref 3.5–5.1)
Sodium: 139 mEq/L (ref 136–145)

## 2018-08-06 LAB — CBC WITH AUTOMATED DIFF
BASOPHILS: 0.1 % (ref 0–3)
EOSINOPHILS: 0 % (ref 0–5)
HCT: 36.6 % — ABNORMAL LOW (ref 37.0–50.0)
HGB: 12.2 gm/dl — ABNORMAL LOW (ref 13.0–17.2)
IMMATURE GRANULOCYTES: 0.4 % (ref 0.0–3.0)
LYMPHOCYTES: 13.8 % — ABNORMAL LOW (ref 28–48)
MCH: 28.6 pg (ref 25.4–34.6)
MCHC: 33.3 gm/dl (ref 30.0–36.0)
MCV: 85.9 fL (ref 80.0–98.0)
MONOCYTES: 10.1 % (ref 1–13)
MPV: 11.6 fL — ABNORMAL HIGH (ref 6.0–10.0)
NEUTROPHILS: 75.6 % — ABNORMAL HIGH (ref 34–64)
NRBC: 0 (ref 0–0)
PLATELET: 213 10*3/uL (ref 140–450)
RBC: 4.26 M/uL (ref 3.60–5.20)
RDW-SD: 41.1 (ref 36.4–46.3)
WBC: 14.1 10*3/uL — ABNORMAL HIGH (ref 4.0–11.0)

## 2018-08-06 LAB — GLUCOSE, POC
Glucose (POC): 281 mg/dL — ABNORMAL HIGH (ref 65–105)
Glucose (POC): 185 mg/dL — ABNORMAL HIGH (ref 65–105)
Glucose (POC): 132 mg/dL — ABNORMAL HIGH (ref 65–105)
Glucose (POC): 229 mg/dL — ABNORMAL HIGH (ref 65–105)

## 2018-08-06 LAB — SED RATE (ESR): Sed rate (ESR): 35 mm/Hr — ABNORMAL HIGH (ref 0–20)

## 2018-08-06 LAB — C REACTIVE PROTEIN, QT: C-Reactive protein: <2.9 mg/L (ref 0.0–2.9)

## 2018-08-06 MED ORDER — BISACODYL 5 MG TAB, DELAYED RELEASE
5 mg | Freq: Every day | ORAL | Status: DC | PRN
Start: 2018-08-06 — End: 2018-08-08
  Administered 2018-08-08: 15:00:00 via ORAL

## 2018-08-06 MED ORDER — SENNOSIDES 8.6 MG TAB
8.6 mg | Freq: Every day | ORAL | Status: DC
Start: 2018-08-06 — End: 2018-08-08
  Administered 2018-08-07 – 2018-08-08 (×2): via ORAL

## 2018-08-06 MED ORDER — BISACODYL 10 MG RECTAL SUPPOSITORY
10 mg | Freq: Every day | RECTAL | Status: DC | PRN
Start: 2018-08-06 — End: 2018-08-08

## 2018-08-06 MED ORDER — DOCUSATE SODIUM 100 MG CAP
100 mg | Freq: Two times a day (BID) | ORAL | Status: DC
Start: 2018-08-06 — End: 2018-08-08
  Administered 2018-08-07 – 2018-08-08 (×4): via ORAL

## 2018-08-06 MED ORDER — POLYETHYLENE GLYCOL 3350 17 GRAM (100 %) ORAL POWDER PACKET
17 gram | Freq: Two times a day (BID) | ORAL | Status: DC | PRN
Start: 2018-08-06 — End: 2018-08-08

## 2018-08-06 MED ORDER — LORAZEPAM 2 MG/ML IJ SOLN
2 mg/mL | INTRAMUSCULAR | Status: DC
Start: 2018-08-06 — End: 2018-08-08

## 2018-08-06 MED FILL — HYDRALAZINE 20 MG/ML IJ SOLN: 20 mg/mL | INTRAMUSCULAR | Qty: 1

## 2018-08-06 MED FILL — CLONIDINE 0.1 MG TAB: 0.1 mg | ORAL | Qty: 3

## 2018-08-06 MED FILL — SPIRONOLACTONE 25 MG TAB: 25 mg | ORAL | Qty: 4

## 2018-08-06 MED FILL — QUETIAPINE SR 50 MG 24 HR TAB: 50 mg | ORAL | Qty: 3

## 2018-08-06 MED FILL — GABAPENTIN 300 MG CAP: 300 mg | ORAL | Qty: 1

## 2018-08-06 MED FILL — MONTELUKAST 10 MG TAB: 10 mg | ORAL | Qty: 1

## 2018-08-06 MED FILL — MAGNESIUM OXIDE 400 MG TAB: 400 mg | ORAL | Qty: 1

## 2018-08-06 MED FILL — ROSUVASTATIN 5 MG TAB: 5 mg | ORAL | Qty: 1

## 2018-08-06 MED FILL — AZITHROMYCIN 250 MG TAB: 250 mg | ORAL | Qty: 1

## 2018-08-06 MED FILL — ENOXAPARIN 40 MG/0.4 ML SUB-Q SYRINGE: 40 mg/0.4 mL | SUBCUTANEOUS | Qty: 0.4

## 2018-08-06 MED FILL — ACETAMINOPHEN 325 MG TABLET: 325 mg | ORAL | Qty: 2

## 2018-08-06 MED FILL — DILTIAZEM ER 180 MG 24 HR CAP: 180 mg | ORAL | Qty: 1

## 2018-08-06 MED FILL — ALBUTEROL SULFATE 0.083 % (0.83 MG/ML) SOLN FOR INHALATION: 2.5 mg /3 mL (0.083 %) | RESPIRATORY_TRACT | Qty: 1

## 2018-08-06 MED FILL — BUTALBITAL-ACETAMINOPHEN-CAFFEINE 50 MG-325 MG-40 MG TAB: 50-325-40 mg | ORAL | Qty: 1

## 2018-08-06 MED FILL — LOSARTAN 25 MG TAB: 25 mg | ORAL | Qty: 1

## 2018-08-06 MED FILL — PREDNISONE 20 MG TAB: 20 mg | ORAL | Qty: 2

## 2018-08-06 MED FILL — HYDROCODONE-ACETAMINOPHEN 5 MG-325 MG TAB: 5-325 mg | ORAL | Qty: 1

## 2018-08-06 NOTE — Progress Notes (Signed)
Bedside and Verbal shift change report given to Trinidad RN (oncoming nurse) by Everest  RN (offgoing nurse). Report included the following information SBAR, Intake/Output, MAR and Recent Results.

## 2018-08-06 NOTE — Progress Notes (Signed)
CM attempted to visit patient at bedside. Respiratory Therapist at bedside. Will attempt to visit patient later in the day.

## 2018-08-06 NOTE — Progress Notes (Signed)
Problem: Gas Exchange - Impaired  Goal: *Absence of hypoxia  Outcome: Progressing Towards Goal

## 2018-08-06 NOTE — Progress Notes (Signed)
Problem: Falls - Risk of  Goal: *Absence of Falls  Description  Document Schmid Fall Risk and appropriate interventions in the flowsheet.  Outcome: Progressing Towards Goal  Note: Fall Risk Interventions:  Mobility Interventions: Patient to call before getting OOB         Medication Interventions: Patient to call before getting OOB    Elimination Interventions: Call light in reach              Problem: Patient Education: Go to Patient Education Activity  Goal: Patient/Family Education  Outcome: Progressing Towards Goal     Problem: Risk for Spread of Infection  Goal: Prevent transmission of infectious organism to others  Description  Prevent the transmission of infectious organisms to other patients, staff members, and visitors.  Outcome: Progressing Towards Goal     Problem: Patient Education:  Go to Education Activity  Goal: Patient/Family Education  Outcome: Progressing Towards Goal     Problem: Gas Exchange - Impaired  Goal: *Absence of hypoxia  Outcome: Progressing Towards Goal     Problem: Pressure Injury - Risk of  Goal: *Prevention of pressure injury  Description  Document Braden Scale and appropriate interventions in the flowsheet.  Outcome: Progressing Towards Goal  Note: Pressure Injury Interventions:       Moisture Interventions: Internal/External urinary devices    Activity Interventions: Pressure redistribution bed/mattress(bed type), Increase time out of bed    Mobility Interventions: Pressure redistribution bed/mattress (bed type)    Nutrition Interventions: Document food/fluid/supplement intake    Friction and Shear Interventions: Apply protective barrier, creams and emollients                Problem: Patient Education: Go to Patient Education Activity  Goal: Patient/Family Education  Outcome: Progressing Towards Goal

## 2018-08-06 NOTE — Progress Notes (Signed)
Hospitalist Progress Note         [facility]    Daily Progress Note: 08/06/2018    Assessment/Plan:     1. COPD Exacerbation:  -WBC improved but still elevated  -Consider resuming if develops fever or worsening sputum production  -We will continue treatment with oral steroids, scheduled albuterol nebs, daily Spiriva  -Add Breo for home Advair, continue home Singulair  -We will continue p.o. azithromycin  -O2 support as needed, currently on RA  ??  2.  Headache  -Treat for hypertension as below  -Treat supportively with p.o. Fioricet and IV Compazine as needed  -PRN norco and IV morphine if needed for more severe pain.  -ESR of 35, Check CRP  -Will check MRI as HA continues but BP better controlled today.  -Will also check R side temporal U/s  ??  3.  Hypertension:  -Continue home clonidine, diltiazem, losartan, Aldactone  -Improved, but HA Continues  -We will have IV hydralazine if needed for markedly elevated blood pressures.  ??  4.  Diabetes:  -A1c of 6 earlier this month  -We will order Glucomander at standard regimen given steroid use  -Holding home metformin  ??  5.  Peripheral neuropathy:  -Continue home gabapentin  ??  6.  Chronic right hip pain  -Does not appear to be on a scheduled medication for this at home  -Spouse reports no longer takes nabumetone  ??  7.  Marijuana abuse:  -Counseled on cessation given COPD  ??  8.  PTSD:  -Continue home Seroquel nightly  -Trazodone nightly as needed  ??  9.  Hyperlipidemia:  -Continue home Crestor    10. Constipation:  -Unclear why c.diff assay and contact ordered  -Pt reports constipation, no diarrhea  -Add bowel regimen. Cancel c.diff and contact isolation.  ??  Prophylaxis: Lovenox subcu daily    Dispo:  -Resp status seems improved  -check MRI, R temporal PVL to assess for more concerning causes of HA  -If studies above negative, consider d/c tomorrow with symptomatic treatment.    Subjective:     Pt reports continued HA, around the R eye.  No changes in vision,  sensation or strength.  Denies diarrhea, reports more constipation.  Reports SOB improved.    General ROS: (-) for fever, chills,  Respiratory ROS: Improving cough, shortness of breath, wheezing  Cardiovascular ROS: (-) for chest pain, palpitations  Gastrointestinal ROS: (+) for constipation, (-) for abdominal pain, nausea,vomiting, diarrhea  Neuro ROS: (+) for R sided HA, (-) for changes in hearing, vision, focal numbness, weakness, tingling    Objective:   Physical Exam:     Visit Vitals  BP 112/86 (BP 1 Location: Right arm, BP Patient Position: Supine)   Pulse 90   Temp 98.1 ??F (36.7 ??C)   Resp 16   Ht 5' 9"  (1.753 m)   Wt 85.3 kg (188 lb)   SpO2 94%   BMI 27.76 kg/m??    O2 Flow Rate (L/min): 2 l/min O2 Device: Room air    Temp (24hrs), Avg:98.1 ??F (36.7 ??C), Min:97.8 ??F (36.6 ??C), Max:98.4 ??F (36.9 ??C)    02/24 0701 - 02/24 1900  In: 240 [P.O.:240]  Out: 550 [Urine:550]   02/22 1901 - 02/24 0700  In: 2060 [P.O.:760; I.V.:1300]  Out: 1450 [Urine:1450]    General: Alert, Overweight AAF, pleasant to conversation, in no acute distress, on room air  HEENT: White sclera. PERRL, Oral Mucosa pink and moist.  No temporal tenderness  to palpation  CV: Regular rate and rhythm, no murmurs, rubs, gallops  Pulm: Lungs clear, no wheeze at this time,  no crackles, rhonchi  GI: Soft, nontender, nondistended, normal bowel sounds  Extremity: No clubbing, cyanosis, no pitting lower leg edema, capillary refill time intact to distal fingertips  Skin: Warm, dry  Neuro: Cranial nerves II through XII intact to testing, Visual fields not tested.  5 out of 5 strength in the bilateral hands and feet. Grossly normal sensation at the distal limbs.      Data Review:       24 Hour Results:  Recent Results (from the past 24 hour(s))   GLUCOSE, POC    Collection Time: 08/05/18  5:22 PM   Result Value Ref Range    Glucose (POC) 196 (H) 65 - 105 mg/dL   GLUCOSE, POC    Collection Time: 08/05/18 10:20 PM   Result Value Ref Range     Glucose (POC) 281 (H) 65 - 875 mg/dL   METABOLIC PANEL, BASIC    Collection Time: 08/06/18  8:42 AM   Result Value Ref Range    Sodium 139 136 - 145 mEq/L    Potassium 4.0 3.5 - 5.1 mEq/L    Chloride 106 98 - 107 mEq/L    CO2 29 21 - 32 mEq/L    Glucose 179 (H) 74 - 106 mg/dl    BUN 21 7 - 25 mg/dl    Creatinine 1.0 0.6 - 1.3 mg/dl    GFR est AA >60.0      GFR est non-AA 59      Calcium 8.8 8.5 - 10.1 mg/dl    Anion gap 4 (L) 5 - 15 mmol/L   CBC WITH AUTOMATED DIFF    Collection Time: 08/06/18  8:42 AM   Result Value Ref Range    WBC 14.1 (H) 4.0 - 11.0 1000/mm3    RBC 4.26 3.60 - 5.20 M/uL    HGB 12.2 (L) 13.0 - 17.2 gm/dl    HCT 36.6 (L) 37.0 - 50.0 %    MCV 85.9 80.0 - 98.0 fL    MCH 28.6 25.4 - 34.6 pg    MCHC 33.3 30.0 - 36.0 gm/dl    PLATELET 213 140 - 450 1000/mm3    MPV 11.6 (H) 6.0 - 10.0 fL    RDW-SD 41.1 36.4 - 46.3      NRBC 0 0 - 0      IMMATURE GRANULOCYTES 0.4 0.0 - 3.0 %    NEUTROPHILS 75.6 (H) 34 - 64 %    LYMPHOCYTES 13.8 (L) 28 - 48 %    MONOCYTES 10.1 1 - 13 %    EOSINOPHILS 0.0 0 - 5 %    BASOPHILS 0.1 0 - 3 %   SED RATE (ESR)    Collection Time: 08/06/18  8:42 AM   Result Value Ref Range    Sed rate (ESR) 35 (H) 0 - 20 mm/Hr   GLUCOSE, POC    Collection Time: 08/06/18  8:42 AM   Result Value Ref Range    Glucose (POC) 185 (H) 65 - 105 mg/dL   GLUCOSE, POC    Collection Time: 08/06/18 12:28 PM   Result Value Ref Range    Glucose (POC) 132 (H) 65 - 105 mg/dL       Problem List:  Problem List as of 08/06/2018 Date Reviewed: 2018-05-01          Codes Class Noted - Resolved  Headache ICD-10-CM: R51  ICD-9-CM: 784.0  08/05/2018 - Present        Abdominal pain ICD-10-CM: R10.9  ICD-9-CM: 789.00  02/04/2018 - Present        COPD exacerbation ([place]) ICD-10-CM: J44.1  ICD-9-CM: 491.21  11/14/2017 - Present        SIRS (systemic inflammatory response syndrome) (HCC) ICD-10-CM: R65.10  ICD-9-CM: 995.90  10/09/2017 - Present        Hyperkalemia ICD-10-CM: E87.5  ICD-9-CM: 276.7  09/08/2017 - Present         Obtunded ICD-10-CM: R40.1  ICD-9-CM: 780.09  09/08/2017 - Present        Acute renal failure (ARF) (HCC) ICD-10-CM: N17.9  ICD-9-CM: 584.9  09/08/2017 - Present        Septic shock (HCC) ICD-10-CM: A41.9, R65.21  ICD-9-CM: 038.9, 785.52, 995.92  09/08/2017 - Present        Metabolic encephalopathy [id number]: G93.41  ICD-9-CM: 348.31  09/08/2017 - Present        Dehydration ICD-10-CM: E86.0  ICD-9-CM: 276.51  08/07/2017 - Present        COPD with acute exacerbation ([place]) ICD-10-CM: J44.1  ICD-9-CM: 491.21  08/07/2017 - Present        Acute-on-chronic kidney injury ([place]) ICD-10-CM: N17.9, N18.9  ICD-9-CM: 584.9, 585.9  08/07/2017 - Present        Chest pain ICD-10-CM: R07.9  ICD-9-CM: 786.50  06/15/2017 - Present        Dyspnea ICD-10-CM: R06.00  ICD-9-CM: 786.09  06/15/2017 - Present        Type 2 diabetes mellitus with diabetic neuropathy ([place]) ICD-10-CM: E11.40  ICD-9-CM: 250.60, 357.2  12/07/2016 - Present        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  ICD-9-CM: 569.89  08/17/2016 - Present        Cannabinoid hyperemesis syndrome ([place]) ICD-10-CM: F12.988  ICD-9-CM: 536.2, 305.20  08/17/2016 - Present        Gastritis ICD-10-CM: K29.70  ICD-9-CM: 535.50  08/16/2016 - Present        Nausea & vomiting ICD-10-CM: R11.2  ICD-9-CM: 787.01  08/16/2016 - Present        Asthma with acute exacerbation ICD-10-CM: J45.901  ICD-9-CM: 493.92  08/04/2016 - Present        Lactic acidosis ICD-10-CM: E87.2  ICD-9-CM: 276.2  07/24/2016 - Present        Leukocytosis ICD-10-CM: D72.829  ICD-9-CM: 288.60  07/24/2016 - Present        Sepsis ([place]) ICD-10-CM: A41.9  ICD-9-CM: 038.9, 995.91  07/24/2016 - Present        Asthma exacerbation ICD-10-CM: J45.901  ICD-9-CM: 493.92  07/24/2016 - Present        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-10-CM: E11.21  ICD-9-CM: 250.40, 583.81  06/12/2016 - Present        Sacroiliitis ([place]) ICD-10-CM: M46.1  ICD-9-CM: 720.2  11/25/2015 - Present        Essential hypertension ICD-10-CM: I10   ICD-9-CM: 401.9  10/06/2015 - Present        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  ICD-9-CM: 721.3  09/15/2015 - Present        Lumbar and sacral osteoarthritis ICD-10-CM: M47.817  ICD-9-CM: 721.3  09/15/2015 - Present        Chronic pain syndrome ICD-10-CM: G89.4  ICD-9-CM: 338.4  09/15/2015 - Present        Type 2 diabetes mellitus with hyperglycemia, without long-term current use of insulin ([place]) ICD-10-CM: E11.65  ICD-9-CM: 250.00, 790.29  08/20/2015 - Present        Acute colitis ICD-10-CM: K52.9  ICD-9-CM: 558.9  07/02/2015 - Present        Acute hyperglycemia ICD-10-CM: R73.9  ICD-9-CM: 790.29  07/02/2015 - Present        Accelerated hypertension ICD-10-CM: I10  ICD-9-CM: 401.0  04/08/2015 - Present        Severe headache ICD-10-CM: R51  ICD-9-CM: 784.0  04/07/2015 - Present        Osteoarthritis of hips, 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  ICD-9-CM: 715.95  01/29/2015 - Present        PTSD (post-traumatic stress disorder) (Chronic) ICD-10-CM: F43.10  ICD-9-CM: 309.81  01/29/2015 - Present        Severe hypertension (Chronic) ICD-10-CM: I10  ICD-9-CM: 401.9  07/21/2014 - Present        RESOLVED: New onset type 1 diabetes mellitus, uncontrolled ([place]) ICD-10-CM: E10.65  ICD-9-CM: 250.03  07/02/2015 - 10/06/2015        RESOLVED: Recurrent major depressive disorder, in full remission ([place]) ICD-10-CM: F33.42  ICD-9-CM: 296.36  12/02/2014 - 12/02/2014        RESOLVED: Foreign body in colon ICD-10-CM: T18.4XXA  ICD-9-CM: 710  08/14/2014 - 01/29/2015        RESOLVED: Advanced care planning/counseling discussion ICD-10-CM: Z71.89  ICD-9-CM: V65.49  07/21/2014 - 01/29/2015    Overview Signed 07/21/2014  3:46 PM by [place]     Patient given [place] of [place] application               RESOLVED: Hip pain, chronic ICD-10-CM: M25.559, G89.29  ICD-9-CM: 719.45, 338.29  07/21/2014 - 01/29/2015               Medications reviewed  Current Facility-Administered Medications   Medication Dose Route Frequency    ??? LORazepam (ATIVAN) injection 1 mg  1 mg IntraVENous ON CALL   ??? docusate sodium (COLACE) capsule 100 mg  100 mg Oral BID   ??? [START ON 08/07/2018] senna (SENOKOT) tablet 8.6 mg  1 Tab Oral DAILY   ??? polyethylene glycol (MIRALAX) packet 17 g  17 g Oral BID PRN   ??? bisacodyL (DULCOLAX) suppository 10 mg  10 mg Rectal DAILY PRN   ??? bisacodyL (DULCOLAX) tablet 10 mg  10 mg Oral DAILY PRN   ??? cloNIDine HCL (CATAPRES) tablet 0.3 mg  0.3 mg Oral BID   ??? dilTIAZem CD (CARDIZEM CD) capsule 180 mg  180 mg Oral DAILY   ??? gabapentin (NEURONTIN) capsule 300 mg  300 mg Oral TID   ??? losartan (COZAAR) tablet 12.5 mg  12.5 mg Oral DAILY   ??? magnesium oxide (MAG-OX) tablet 400 mg  400 mg Oral DAILY   ??? montelukast (SINGULAIR) tablet 10 mg  10 mg Oral QHS   ??? QUEtiapine SR (SEROquel XR) tablet 150 mg  150 mg Oral QHS   ??? rosuvastatin (CRESTOR) tablet 5 mg  5 mg Oral QHS   ??? spironolactone (ALDACTONE) tablet 100 mg  100 mg Oral DAILY   ??? tiotropium bromide (SPIRIVA RESPIMAT) 2.5 mcg /actuation  1 Puff Inhalation DAILY   ??? traZODone (DESYREL) tablet 50 mg  50 mg Oral QHS PRN   ??? naloxone (NARCAN) injection 0.1 mg  0.1 mg IntraVENous PRN   ??? acetaminophen (TYLENOL) tablet 650 mg  650 mg Oral Q4H PRN    Or   ??? acetaminophen (TYLENOL) solution 650 mg  650 mg Oral Q4H PRN    Or   ???  acetaminophen (TYLENOL) suppository 650 mg  650 mg Rectal Q4H PRN   ??? HYDROcodone-acetaminophen (NORCO) 5-325 mg per tablet 1 Tab  1 Tab Oral Q6H PRN   ??? morphine injection 2 mg  2 mg IntraVENous Q6H PRN   ??? azithromycin (ZITHROMAX) tablet 250 mg  250 mg Oral DAILY   ??? diphenhydrAMINE (BENADRYL) capsule 25 mg  25 mg Oral Q4H PRN   ??? ondansetron (ZOFRAN) injection 4 mg  4 mg IntraVENous Q4H PRN   ??? prochlorperazine (COMPAZINE) with saline injection 10 mg  10 mg IntraVENous Q6H PRN   ??? enoxaparin (LOVENOX) injection 40 mg  40 mg SubCUTAneous Q24H   ??? albuterol (PROVENTIL VENTOLIN) nebulizer solution 2.5 mg  2.5 mg Nebulization Q6HWA RT    ??? polyethylene glycol (MIRALAX) packet 17 g  17 g Oral DAILY PRN   ??? predniSONE (DELTASONE) tablet 40 mg  40 mg Oral DAILY WITH BREAKFAST   ??? butalbital-acetaminophen-caffeine (FIORICET, ESGIC) 50-325-40 mg per tablet 1 Tab  1 Tab Oral Q4H PRN   ??? fluticasone-vilanterol (BREO ELLIPTA) 138mg-25mcg/puff  1 Puff Inhalation DAILY   ??? dextrose (D50) infusion 5-25 g  10-50 mL IntraVENous PRN   ??? glucagon (GLUCAGEN) injection 1 mg  1 mg IntraMUSCular PRN   ??? insulin glargine (LANTUS) injection 1-100 Units  1-100 Units SubCUTAneous QHS   ??? insulin lispro (HUMALOG) injection 1-100 Units  1-100 Units SubCUTAneous AC&HS   ??? insulin lispro (HUMALOG) injection 1-100 Units  1-100 Units SubCUTAneous PRN   ??? hydrALAZINE (APRESOLINE) 20 mg/mL injection 10 mg  10 mg IntraVENous Q6H PRN        Care Plan discussed with: Patient/Family and Nurse    Total time spent with patient: 31 minutes.    JMarzella Schlein MD  August 06, 2018  14:43

## 2018-08-06 NOTE — Progress Notes (Signed)
Patient's dinner tray was not served, pt anxious unhappy, spoke to Charge. Offered sandwich and drink, taking dinner, watching TV, pleasant.

## 2018-08-06 NOTE — Other (Addendum)
Bedside and Verbal shift change report given to [employer] (Cabin crew) by [place] RN Physiological scientist). Report included the following information SBAR, Intake/Output, MAR and Recent Results.  Pt expressed concerns concerning dinner served

## 2018-08-06 NOTE — Progress Notes (Signed)
Hospitalist Progress Note         [facility]    Daily Progress Note: 08/06/2018    Assessment/Plan:     1. COPD Exacerbation:  -WBC improved but still elevated  -Consider resuming if develops fever or worsening sputum production  -We will continue treatment with oral steroids, scheduled albuterol nebs, daily Spiriva  -Add Breo for home Advair, continue home Singulair  -We will continue p.o. azithromycin  -O2 support as needed, currently on RA  ??  2.  Headache  -Treat for hypertension as below  -Treat supportively with p.o. Fioricet and IV Compazine as needed  -PRN norco and IV morphine if needed for more severe pain.  -ESR of 35, Check CRP  -Will check MRI as HA continues but BP better controlled today.  -Will also check R side temporal U/s  ??  3.  Hypertension:  -Continue home clonidine, diltiazem, losartan, Aldactone  -Improved, but HA Continues  -We will have IV hydralazine if needed for markedly elevated blood pressures.  ??  4.  Diabetes:  -A1c of 6 earlier this month  -We will order Glucomander at standard regimen given steroid use  -Holding home metformin  ??  5.  Peripheral neuropathy:  -Continue home gabapentin  ??  6.  Chronic right hip pain  -Does not appear to be on a scheduled medication for this at home  -Spouse reports no longer takes nabumetone  ??  7.  Marijuana abuse:  -Counseled on cessation given COPD  ??  8.  PTSD:  -Continue home Seroquel nightly  -Trazodone nightly as needed  ??  9.  Hyperlipidemia:  -Continue home Crestor    10. Constipation:  -Unclear why c.diff assay and contact ordered  -Pt reports constipation, no diarrhea  -Add bowel regimen. Cancel c.diff and contact isolation.  ??  Prophylaxis: Lovenox subcu daily    Dispo:  -Resp status seems improved  -check MRI, R temporal PVL to assess for more concerning causes of HA  -If studies above negative, consider d/c tomorrow with symptomatic treatment.    Subjective:     Pt reports continued HA, around the R eye.  No changes in vision,  sensation or strength.  Denies diarrhea, reports more constipation.  Reports SOB improved.    General ROS: (-) for fever, chills,  Respiratory ROS: Improving cough, shortness of breath, wheezing  Cardiovascular ROS: (-) for chest pain, palpitations  Gastrointestinal ROS: (+) for constipation, (-) for abdominal pain, nausea,vomiting, diarrhea  Neuro ROS: (+) for R sided HA, (-) for changes in hearing, vision, focal numbness, weakness, tingling    Objective:   Physical Exam:     Visit Vitals  BP 112/86 (BP 1 Location: Right arm, BP Patient Position: Supine)   Pulse 90   Temp 98.1 ??F (36.7 ??C)   Resp 16   Ht 5' 9"  (1.753 m)   Wt 85.3 kg (188 lb)   SpO2 94%   BMI 27.76 kg/m??    O2 Flow Rate (L/min): 2 l/min O2 Device: Room air    Temp (24hrs), Avg:98.1 ??F (36.7 ??C), Min:97.8 ??F (36.6 ??C), Max:98.4 ??F (36.9 ??C)    02/24 0701 - 02/24 1900  In: 240 [P.O.:240]  Out: 550 [Urine:550]   02/22 1901 - 02/24 0700  In: 2060 [P.O.:760; I.V.:1300]  Out: 1450 [Urine:1450]    General: Alert, Overweight AAF, pleasant to conversation, in no acute distress, on room air  HEENT: White sclera. PERRL, Oral Mucosa pink and moist.  No temporal tenderness  to palpation  CV: Regular rate and rhythm, no murmurs, rubs, gallops  Pulm: Lungs clear, no wheeze at this time,  no crackles, rhonchi  GI: Soft, nontender, nondistended, normal bowel sounds  Extremity: No clubbing, cyanosis, no pitting lower leg edema, capillary refill time intact to distal fingertips  Skin: Warm, dry  Neuro: Cranial nerves II through XII intact to testing, Visual fields not tested.  5 out of 5 strength in the bilateral hands and feet. Grossly normal sensation at the distal limbs.      Data Review:       24 Hour Results:  Recent Results (from the past 24 hour(s))   GLUCOSE, POC    Collection Time: 08/05/18  5:22 PM   Result Value Ref Range    Glucose (POC) 196 (H) 65 - 105 mg/dL   GLUCOSE, POC    Collection Time: 08/05/18 10:20 PM   Result Value Ref Range    Glucose (POC) 281  (H) 65 - 347 mg/dL   METABOLIC PANEL, BASIC    Collection Time: 08/06/18  8:42 AM   Result Value Ref Range    Sodium 139 136 - 145 mEq/L    Potassium 4.0 3.5 - 5.1 mEq/L    Chloride 106 98 - 107 mEq/L    CO2 29 21 - 32 mEq/L    Glucose 179 (H) 74 - 106 mg/dl    BUN 21 7 - 25 mg/dl    Creatinine 1.0 0.6 - 1.3 mg/dl    GFR est AA >60.0      GFR est non-AA 59      Calcium 8.8 8.5 - 10.1 mg/dl    Anion gap 4 (L) 5 - 15 mmol/L   CBC WITH AUTOMATED DIFF    Collection Time: 08/06/18  8:42 AM   Result Value Ref Range    WBC 14.1 (H) 4.0 - 11.0 1000/mm3    RBC 4.26 3.60 - 5.20 M/uL    HGB 12.2 (L) 13.0 - 17.2 gm/dl    HCT 36.6 (L) 37.0 - 50.0 %    MCV 85.9 80.0 - 98.0 fL    MCH 28.6 25.4 - 34.6 pg    MCHC 33.3 30.0 - 36.0 gm/dl    PLATELET 213 140 - 450 1000/mm3    MPV 11.6 (H) 6.0 - 10.0 fL    RDW-SD 41.1 36.4 - 46.3      NRBC 0 0 - 0      IMMATURE GRANULOCYTES 0.4 0.0 - 3.0 %    NEUTROPHILS 75.6 (H) 34 - 64 %    LYMPHOCYTES 13.8 (L) 28 - 48 %    MONOCYTES 10.1 1 - 13 %    EOSINOPHILS 0.0 0 - 5 %    BASOPHILS 0.1 0 - 3 %   SED RATE (ESR)    Collection Time: 08/06/18  8:42 AM   Result Value Ref Range    Sed rate (ESR) 35 (H) 0 - 20 mm/Hr   GLUCOSE, POC    Collection Time: 08/06/18  8:42 AM   Result Value Ref Range    Glucose (POC) 185 (H) 65 - 105 mg/dL   GLUCOSE, POC    Collection Time: 08/06/18 12:28 PM   Result Value Ref Range    Glucose (POC) 132 (H) 65 - 105 mg/dL       Problem List:  Problem List as of 08/06/2018 Date Reviewed: 05-12-2018          Codes Class Noted - Resolved  Headache ICD-10-CM: R51  ICD-9-CM: 784.0  08/05/2018 - Present        Abdominal pain ICD-10-CM: R10.9  ICD-9-CM: 789.00  02/04/2018 - Present        COPD exacerbation ([place]) ICD-10-CM: J44.1  ICD-9-CM: 491.21  11/14/2017 - Present        SIRS (systemic inflammatory response syndrome) (HCC) ICD-10-CM: R65.10  ICD-9-CM: 995.90  10/09/2017 - Present        Hyperkalemia ICD-10-CM: E87.5  ICD-9-CM: 276.7  09/08/2017 - Present        Obtunded ICD-10-CM:  R40.1  ICD-9-CM: 780.09  09/08/2017 - Present        Acute renal failure (ARF) (HCC) ICD-10-CM: N17.9  ICD-9-CM: 584.9  09/08/2017 - Present        Septic shock (HCC) ICD-10-CM: A41.9, R65.21  ICD-9-CM: 038.9, 785.52, 995.92  09/08/2017 - Present        Metabolic encephalopathy UUV-25-DG: G93.41  ICD-9-CM: 348.31  09/08/2017 - Present        Dehydration ICD-10-CM: E86.0  ICD-9-CM: 276.51  08/07/2017 - Present        COPD with acute exacerbation ([place]) ICD-10-CM: J44.1  ICD-9-CM: 491.21  08/07/2017 - Present        Acute-on-chronic kidney injury ([place]) ICD-10-CM: N17.9, N18.9  ICD-9-CM: 584.9, 585.9  08/07/2017 - Present        Chest pain ICD-10-CM: R07.9  ICD-9-CM: 786.50  06/15/2017 - Present        Dyspnea ICD-10-CM: R06.00  ICD-9-CM: 786.09  06/15/2017 - Present        Type 2 diabetes mellitus with diabetic neuropathy ([place]) ICD-10-CM: E11.40  ICD-9-CM: 250.60, 357.2  12/07/2016 - Present        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  ICD-9-CM: 569.89  08/17/2016 - Present        Cannabinoid hyperemesis syndrome ([place]) ICD-10-CM: F12.988  ICD-9-CM: 536.2, 305.20  08/17/2016 - Present        Gastritis ICD-10-CM: K29.70  ICD-9-CM: 535.50  08/16/2016 - Present        Nausea & vomiting ICD-10-CM: R11.2  ICD-9-CM: 787.01  08/16/2016 - Present        Asthma with acute exacerbation ICD-10-CM: J45.901  ICD-9-CM: 493.92  08/04/2016 - Present        Lactic acidosis ICD-10-CM: E87.2  ICD-9-CM: 276.2  07/24/2016 - Present        Leukocytosis ICD-10-CM: D72.829  ICD-9-CM: 288.60  07/24/2016 - Present        Sepsis ([place]) ICD-10-CM: A41.9  ICD-9-CM: 038.9, 995.91  07/24/2016 - Present        Asthma exacerbation ICD-10-CM: J45.901  ICD-9-CM: 493.92  07/24/2016 - Present        Type 2 diabetes mellitus with nephropathy ([place]) ICD-10-CM: E11.21  ICD-9-CM: 250.40, 583.81  06/12/2016 - Present        Sacroiliitis ([place]) ICD-10-CM: M46.1  ICD-9-CM: 720.2  11/25/2015 - Present        Essential hypertension ICD-10-CM: I10  ICD-9-CM: 401.9  10/06/2015 - Present         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  ICD-9-CM: 721.3  09/15/2015 - Present        Lumbar and sacral osteoarthritis ICD-10-CM: M47.817  ICD-9-CM: 721.3  09/15/2015 - Present        Chronic pain syndrome ICD-10-CM: G89.4  ICD-9-CM: 338.4  09/15/2015 - Present        Type 2 diabetes mellitus with hyperglycemia, without long-term current use of insulin ([place]) ICD-10-CM: E11.65  ICD-9-CM: 250.00, 790.29  08/20/2015 - Present        Acute colitis ICD-10-CM: K52.9  ICD-9-CM: 558.9  07/02/2015 - Present        Acute hyperglycemia ICD-10-CM: R73.9  ICD-9-CM: 790.29  07/02/2015 - Present        Accelerated hypertension ICD-10-CM: I10  ICD-9-CM: 401.0  04/08/2015 - Present        Severe headache ICD-10-CM: R51  ICD-9-CM: 784.0  04/07/2015 - Present        Osteoarthritis of hips, 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  ICD-9-CM: 715.95  01/29/2015 - Present        PTSD (post-traumatic stress disorder) (Chronic) ICD-10-CM: F43.10  ICD-9-CM: 309.81  01/29/2015 - Present        Severe hypertension (Chronic) ICD-10-CM: I10  ICD-9-CM: 401.9  07/21/2014 - Present        RESOLVED: New onset type 1 diabetes mellitus, uncontrolled ([place]) ICD-10-CM: E10.65  ICD-9-CM: 250.03  07/02/2015 - 10/06/2015        RESOLVED: Recurrent major depressive disorder, in full remission ([place]) ICD-10-CM: F33.42  ICD-9-CM: 296.36  12/02/2014 - 12/02/2014        RESOLVED: Foreign body in colon ICD-10-CM: T18.4XXA  ICD-9-CM: 601  08/14/2014 - 01/29/2015        RESOLVED: Advanced care planning/counseling discussion ICD-10-CM: Z71.89  ICD-9-CM: V65.49  07/21/2014 - 01/29/2015    Overview Signed 07/21/2014  3:46 PM by [place]     Patient given [place] of [place] application               RESOLVED: Hip pain, chronic ICD-10-CM: M25.559, G89.29  ICD-9-CM: 719.45, 338.29  07/21/2014 - 01/29/2015               Medications reviewed  Current Facility-Administered Medications   Medication Dose Route Frequency   ??? LORazepam (ATIVAN) injection 1 mg  1 mg  IntraVENous ON CALL   ??? docusate sodium (COLACE) capsule 100 mg  100 mg Oral BID   ??? [START ON 08/07/2018] senna (SENOKOT) tablet 8.6 mg  1 Tab Oral DAILY   ??? polyethylene glycol (MIRALAX) packet 17 g  17 g Oral BID PRN   ??? bisacodyL (DULCOLAX) suppository 10 mg  10 mg Rectal DAILY PRN   ??? bisacodyL (DULCOLAX) tablet 10 mg  10 mg Oral DAILY PRN   ??? cloNIDine HCL (CATAPRES) tablet 0.3 mg  0.3 mg Oral BID   ??? dilTIAZem CD (CARDIZEM CD) capsule 180 mg  180 mg Oral DAILY   ??? gabapentin (NEURONTIN) capsule 300 mg  300 mg Oral TID   ??? losartan (COZAAR) tablet 12.5 mg  12.5 mg Oral DAILY   ??? magnesium oxide (MAG-OX) tablet 400 mg  400 mg Oral DAILY   ??? montelukast (SINGULAIR) tablet 10 mg  10 mg Oral QHS   ??? QUEtiapine SR (SEROquel XR) tablet 150 mg  150 mg Oral QHS   ??? rosuvastatin (CRESTOR) tablet 5 mg  5 mg Oral QHS   ??? spironolactone (ALDACTONE) tablet 100 mg  100 mg Oral DAILY   ??? tiotropium bromide (SPIRIVA RESPIMAT) 2.5 mcg /actuation  1 Puff Inhalation DAILY   ??? traZODone (DESYREL) tablet 50 mg  50 mg Oral QHS PRN   ??? naloxone (NARCAN) injection 0.1 mg  0.1 mg IntraVENous PRN   ??? acetaminophen (TYLENOL) tablet 650 mg  650 mg Oral Q4H PRN    Or   ??? acetaminophen (TYLENOL) solution 650 mg  650 mg Oral Q4H PRN    Or   ???  acetaminophen (TYLENOL) suppository 650 mg  650 mg Rectal Q4H PRN   ??? HYDROcodone-acetaminophen (NORCO) 5-325 mg per tablet 1 Tab  1 Tab Oral Q6H PRN   ??? morphine injection 2 mg  2 mg IntraVENous Q6H PRN   ??? azithromycin (ZITHROMAX) tablet 250 mg  250 mg Oral DAILY   ??? diphenhydrAMINE (BENADRYL) capsule 25 mg  25 mg Oral Q4H PRN   ??? ondansetron (ZOFRAN) injection 4 mg  4 mg IntraVENous Q4H PRN   ??? prochlorperazine (COMPAZINE) with saline injection 10 mg  10 mg IntraVENous Q6H PRN   ??? enoxaparin (LOVENOX) injection 40 mg  40 mg SubCUTAneous Q24H   ??? albuterol (PROVENTIL VENTOLIN) nebulizer solution 2.5 mg  2.5 mg Nebulization Q6HWA RT   ??? polyethylene glycol (MIRALAX) packet 17 g  17 g Oral DAILY PRN   ???  predniSONE (DELTASONE) tablet 40 mg  40 mg Oral DAILY WITH BREAKFAST   ??? butalbital-acetaminophen-caffeine (FIORICET, ESGIC) 50-325-40 mg per tablet 1 Tab  1 Tab Oral Q4H PRN   ??? fluticasone-vilanterol (BREO ELLIPTA) 181mg-25mcg/puff  1 Puff Inhalation DAILY   ??? dextrose (D50) infusion 5-25 g  10-50 mL IntraVENous PRN   ??? glucagon (GLUCAGEN) injection 1 mg  1 mg IntraMUSCular PRN   ??? insulin glargine (LANTUS) injection 1-100 Units  1-100 Units SubCUTAneous QHS   ??? insulin lispro (HUMALOG) injection 1-100 Units  1-100 Units SubCUTAneous AC&HS   ??? insulin lispro (HUMALOG) injection 1-100 Units  1-100 Units SubCUTAneous PRN   ??? hydrALAZINE (APRESOLINE) 20 mg/mL injection 10 mg  10 mg IntraVENous Q6H PRN        Care Plan discussed with: Patient/Family and Nurse    Total time spent with patient: 31 minutes.    JMarzella Schlein MD  August 06, 2018  14:43

## 2018-08-06 NOTE — Progress Notes (Signed)
Problem: Falls - Risk of  Goal: *Absence of Falls  Description  Document [name] Fall Risk and appropriate interventions in the flowsheet.  Outcome: Progressing Towards Goal  Note: Fall Risk Interventions:  Mobility Interventions: Patient to call before getting OOB         Medication Interventions: Patient to call before getting OOB    Elimination Interventions: Call light in reach              Problem: Patient Education: Go to Patient Education Activity  Goal: Patient/Family Education  Outcome: Progressing Towards Goal     Problem: Risk for Spread of Infection  Goal: Prevent transmission of infectious organism to others  Description  Prevent the transmission of infectious organisms to other patients, staff members, and visitors.  Outcome: Progressing Towards Goal     Problem: Patient Education:  Go to Education Activity  Goal: Patient/Family Education  Outcome: Progressing Towards Goal     Problem: Gas Exchange - Impaired  Goal: *Absence of hypoxia  Outcome: Progressing Towards Goal     Problem: Pressure Injury - Risk of  Goal: *Prevention of pressure injury  Description  Document Braden Scale and appropriate interventions in the flowsheet.  Outcome: Progressing Towards Goal  Note: Pressure Injury Interventions:       Moisture Interventions: Internal/External urinary devices    Activity Interventions: Pressure redistribution bed/mattress(bed type), Increase time out of bed    Mobility Interventions: Pressure redistribution bed/mattress (bed type)    Nutrition Interventions: Document food/fluid/supplement intake    Friction and Shear Interventions: Apply protective barrier, creams and emollients                Problem: Patient Education: Go to Patient Education Activity  Goal: Patient/Family Education  Outcome: Progressing Towards Goal

## 2018-08-06 NOTE — Progress Notes (Signed)
Bedside and Verbal shift change report given to [place] [employer] fund[employer] (Cabin crew) by [facility] (offgoing nurse). Report included the following information SBAR, Intake/Output, MAR and Recent Results.

## 2018-08-06 NOTE — Progress Notes (Signed)
Patient's dinner tray was not served, pt anxious unhappy, spoke to Charge. Offered sandwich and drink, taking dinner, watching TV, pleasant.

## 2018-08-06 NOTE — Progress Notes (Signed)
CM attempted to visit patient at bedside. Respiratory Therapist at bedside. Will attempt to visit patient later in the day.

## 2018-08-07 ENCOUNTER — Inpatient Hospital Stay: Admit: 2018-08-07 | Payer: MEDICARE | Primary: Physician Assistant

## 2018-08-07 LAB — POCT GLUCOSE
POC Glucose: 307 mg/dL — ABNORMAL HIGH (ref 65–105)
POC Glucose: 136 mg/dL — ABNORMAL HIGH (ref 65–105)
POC Glucose: 159 mg/dL — ABNORMAL HIGH (ref 65–105)
POC Glucose: 218 mg/dL — ABNORMAL HIGH (ref 65–105)

## 2018-08-07 LAB — CBC WITH AUTO DIFFERENTIAL
Basophils %: 0.2 % (ref 0–3)
Eosinophils %: 0 % (ref 0–5)
Hematocrit: 36.1 % — ABNORMAL LOW (ref 37.0–50.0)
Hemoglobin: 11.7 gm/dl — ABNORMAL LOW (ref 13.0–17.2)
Immature Granulocytes: 0.6 % (ref 0.0–3.0)
Lymphocytes %: 14.1 % — ABNORMAL LOW (ref 28–48)
MCH: 29 pg (ref 25.4–34.6)
MCHC: 32.4 gm/dl (ref 30.0–36.0)
MCV: 89.6 fL (ref 80.0–98.0)
MPV: 12.2 fL — ABNORMAL HIGH (ref 6.0–10.0)
Monocytes %: 9.1 % (ref 1–13)
Neutrophils %: 76 % — ABNORMAL HIGH (ref 34–64)
Nucleated RBCs: 0 (ref 0–0)
Platelets: 195 10*3/uL (ref 140–450)
RBC: 4.03 M/uL (ref 3.60–5.20)
RDW-SD: 43.4 (ref 36.4–46.3)
WBC: 11.4 10*3/uL — ABNORMAL HIGH (ref 4.0–11.0)

## 2018-08-07 LAB — CBC WITH AUTOMATED DIFF
BASOPHILS: 0.2 % (ref 0–3)
EOSINOPHILS: 0 % (ref 0–5)
HCT: 36.1 % — ABNORMAL LOW (ref 37.0–50.0)
HGB: 11.7 gm/dl — ABNORMAL LOW (ref 13.0–17.2)
IMMATURE GRANULOCYTES: 0.6 % (ref 0.0–3.0)
LYMPHOCYTES: 14.1 % — ABNORMAL LOW (ref 28–48)
MCH: 29 pg (ref 25.4–34.6)
MCHC: 32.4 gm/dl (ref 30.0–36.0)
MCV: 89.6 fL (ref 80.0–98.0)
MONOCYTES: 9.1 % (ref 1–13)
MPV: 12.2 fL — ABNORMAL HIGH (ref 6.0–10.0)
NEUTROPHILS: 76 % — ABNORMAL HIGH (ref 34–64)
NRBC: 0 (ref 0–0)
PLATELET: 195 10*3/uL (ref 140–450)
RBC: 4.03 M/uL (ref 3.60–5.20)
RDW-SD: 43.4 (ref 36.4–46.3)
WBC: 11.4 10*3/uL — ABNORMAL HIGH (ref 4.0–11.0)

## 2018-08-07 LAB — GLUCOSE, POC
Glucose (POC): 307 mg/dL — ABNORMAL HIGH (ref 65–105)
Glucose (POC): 136 mg/dL — ABNORMAL HIGH (ref 65–105)
Glucose (POC): 159 mg/dL — ABNORMAL HIGH (ref 65–105)
Glucose (POC): 218 mg/dL — ABNORMAL HIGH (ref 65–105)

## 2018-08-07 MED FILL — QUETIAPINE SR 50 MG 24 HR TAB: 50 mg | ORAL | Qty: 3

## 2018-08-07 MED FILL — ALBUTEROL SULFATE 0.083 % (0.83 MG/ML) SOLN FOR INHALATION: 2.5 mg /3 mL (0.083 %) | RESPIRATORY_TRACT | Qty: 1

## 2018-08-07 MED FILL — GABAPENTIN 300 MG CAP: 300 mg | ORAL | Qty: 1

## 2018-08-07 MED FILL — LOVENOX 40 MG/0.4 ML SUBCUTANEOUS SYRINGE: 40 mg/0.4 mL | SUBCUTANEOUS | Qty: 0.4

## 2018-08-07 MED FILL — DILTIAZEM ER 180 MG 24 HR CAP: 180 mg | ORAL | Qty: 1

## 2018-08-07 MED FILL — AZITHROMYCIN 250 MG TAB: 250 mg | ORAL | Qty: 1

## 2018-08-07 MED FILL — PREDNISONE 20 MG TAB: 20 mg | ORAL | Qty: 2

## 2018-08-07 MED FILL — ROSUVASTATIN 5 MG TAB: 5 mg | ORAL | Qty: 1

## 2018-08-07 MED FILL — LOSARTAN 25 MG TAB: 25 mg | ORAL | Qty: 1

## 2018-08-07 MED FILL — SPIRONOLACTONE 25 MG TAB: 25 mg | ORAL | Qty: 4

## 2018-08-07 MED FILL — DOCUSATE SODIUM 100 MG CAP: 100 mg | ORAL | Qty: 1

## 2018-08-07 MED FILL — CLONIDINE 0.1 MG TAB: 0.1 mg | ORAL | Qty: 3

## 2018-08-07 MED FILL — BUTALBITAL-ACETAMINOPHEN-CAFFEINE 50 MG-325 MG-40 MG TAB: 50-325-40 mg | ORAL | Qty: 1

## 2018-08-07 MED FILL — HYDROCODONE-ACETAMINOPHEN 5 MG-325 MG TAB: 5-325 mg | ORAL | Qty: 1

## 2018-08-07 MED FILL — MAGNESIUM OXIDE 400 MG TAB: 400 mg | ORAL | Qty: 1

## 2018-08-07 MED FILL — MONTELUKAST 10 MG TAB: 10 mg | ORAL | Qty: 1

## 2018-08-07 MED FILL — GERI-KOT 8.6 MG TABLET: 8.6 mg | ORAL | Qty: 1

## 2018-08-07 NOTE — Progress Notes (Signed)
Hospitalist Progress Note         [facility]    Daily Progress Note: 08/07/2018    Assessment/Plan:     1. COPD Exacerbation:  -WBC improved  -We will continue treatment with oral steroids, scheduled albuterol nebs, daily Spiriva  -Add Breo for home Advair, continue home Singulair  -We will continue p.o. azithromycin  -O2 support as needed  ??  2.  Headache  -Treat for hypertension as below  -Treat supportively with p.o. Fioricet and IV Compazine as needed  -PRN norco and IV morphine if needed for more severe pain.  -ESR of 35, Negative CRP  -Normal R temporal artery duplex exam  -Awaiting MRI  -BP better controlled.  ??  3.  Hypertension:  -Continue home clonidine, diltiazem, losartan, Aldactone  -Improved, but HA Continues  -We will have IV hydralazine if needed for markedly elevated blood pressures.  ??  4.  Diabetes:  -A1c of 6 earlier this month  -We will order Glucomander at standard regimen given steroid use  -Holding home metformin  ??  5.  Peripheral neuropathy:  -Continue home gabapentin  ??  6.  Chronic right hip pain  -Does not appear to be on a scheduled medication for this at home  -Spouse reports no longer takes nabumetone  ??  7.  Marijuana abuse:  -Counseled on cessation given COPD  ??  8.  PTSD:  -Continue home Seroquel nightly  -Trazodone nightly as needed  ??  9.  Hyperlipidemia:  -Continue home Crestor    10. Constipation:  -Add bowel regimen.  ??  Prophylaxis: Lovenox subcu daily    Dispo:  -Resp status seems improved  -Awaiting MRI, likely d/c after.    Subjective:     Pt reports continued HA today, but SOB improved. No other new complaints.    General ROS: (-) for fever, chills,  Respiratory ROS: Improving cough, shortness of breath, wheezing  Cardiovascular ROS: (-) for chest pain, palpitations  Gastrointestinal ROS: (+) for constipation, (-) for abdominal pain, nausea,vomiting, diarrhea  Neuro ROS: (+) for R sided HA, (-) for changes in hearing, vision, focal numbness, weakness, tingling     Objective:   Physical Exam:     Visit Vitals  BP 102/84 (BP 1 Location: Right arm, BP Patient Position: Supine)   Pulse 82   Temp 99.1 ??F (37.3 ??C)   Resp 18   Ht 5' 9"  (1.753 m)   Wt 85.3 kg (188 lb)   SpO2 96%   BMI 27.76 kg/m??    O2 Flow Rate (L/min): 2 l/min(off and on) O2 Device: Room air    Temp (24hrs), Avg:98.2 ??F (36.8 ??C), Min:97.7 ??F (36.5 ??C), Max:99.1 ??F (37.3 ??C)    No intake/output data recorded.   02/23 1901 - 02/25 0700  In: 42 [P.O.:720]  Out: 3350 [Urine:3350]    General: Alert, Overweight AAF, pleasant to conversation, in no acute distress, on room air  CV: Regular rate and rhythm, no murmurs, rubs, gallops  Pulm: Lungs clear, no wheeze at this time,  no crackles, rhonchi  GI: Soft, nontender, nondistended, normal bowel sounds  Extremity: No clubbing, cyanosis, no pitting lower leg edema, capillary refill time intact to distal fingertips  Skin: Warm, dry    Data Review:       24 Hour Results:  Recent Results (from the past 24 hour(s))   GLUCOSE, POC    Collection Time: 08/06/18  5:22 PM   Result Value Ref Range  Glucose (POC) 229 (H) 65 - 105 mg/dL   GLUCOSE, POC    Collection Time: 08/06/18  9:48 PM   Result Value Ref Range    Glucose (POC) 307 (H) 65 - 105 mg/dL   CBC WITH AUTOMATED DIFF    Collection Time: 08/07/18  5:01 AM   Result Value Ref Range    WBC 11.4 (H) 4.0 - 11.0 1000/mm3    RBC 4.03 3.60 - 5.20 M/uL    HGB 11.7 (L) 13.0 - 17.2 gm/dl    HCT 36.1 (L) 37.0 - 50.0 %    MCV 89.6 80.0 - 98.0 fL    MCH 29.0 25.4 - 34.6 pg    MCHC 32.4 30.0 - 36.0 gm/dl    PLATELET 195 140 - 450 1000/mm3    MPV 12.2 (H) 6.0 - 10.0 fL    RDW-SD 43.4 36.4 - 46.3      NRBC 0 0 - 0      IMMATURE GRANULOCYTES 0.6 0.0 - 3.0 %    NEUTROPHILS 76.0 (H) 34 - 64 %    LYMPHOCYTES 14.1 (L) 28 - 48 %    MONOCYTES 9.1 1 - 13 %    EOSINOPHILS 0.0 0 - 5 %    BASOPHILS 0.2 0 - 3 %   GLUCOSE, POC    Collection Time: 08/07/18  8:37 AM   Result Value Ref Range    Glucose (POC) 136 (H) 65 - 105 mg/dL   GLUCOSE, POC     Collection Time: 08/07/18 12:43 PM   Result Value Ref Range    Glucose (POC) 159 (H) 65 - 105 mg/dL       Problem List:  Problem List as of 08/07/2018 Date Reviewed: 05/02/18          Codes Class Noted - Resolved    Headache ICD-10-CM: R51  ICD-9-CM: 784.0  08/05/2018 - Present        Abdominal pain ICD-10-CM: R10.9  ICD-9-CM: 789.00  02/04/2018 - Present        COPD exacerbation ([place]) ICD-10-CM: J44.1  ICD-9-CM: 491.21  11/14/2017 - Present        SIRS (systemic inflammatory response syndrome) (HCC) ICD-10-CM: R65.10  ICD-9-CM: 995.90  10/09/2017 - Present        Hyperkalemia ICD-10-CM: E87.5  ICD-9-CM: 276.7  09/08/2017 - Present        Obtunded ICD-10-CM: R40.1  ICD-9-CM: 780.09  09/08/2017 - Present        Acute renal failure (ARF) (HCC) ICD-10-CM: N17.9  ICD-9-CM: 584.9  09/08/2017 - Present        Septic shock (HCC) ICD-10-CM: A41.9, R65.21  ICD-9-CM: 038.9, 785.52, 995.92  09/08/2017 - Present        Metabolic encephalopathy ZOX-09-UE: G93.41  ICD-9-CM: 348.31  09/08/2017 - Present        Dehydration ICD-10-CM: E86.0  ICD-9-CM: 276.51  08/07/2017 - Present        COPD with acute exacerbation (HCC) ICD-10-CM: J44.1  ICD-9-CM: 491.21  08/07/2017 - Present        Acute-on-chronic kidney injury ([place]) ICD-10-CM: N17.9, N18.9  ICD-9-CM: 584.9, 585.9  08/07/2017 - Present        Chest pain ICD-10-CM: R07.9  ICD-9-CM: 786.50  06/15/2017 - Present        Dyspnea ICD-10-CM: R06.00  ICD-9-CM: 786.09  06/15/2017 - Present        Type 2 diabetes mellitus with diabetic neuropathy (HCC) ICD-10-CM: E11.40  ICD-9-CM: 250.60, 357.2  12/07/2016 - Present  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  ICD-9-CM: 569.89  08/17/2016 - Present        Cannabinoid hyperemesis syndrome ([place]) ICD-10-CM: F12.988  ICD-9-CM: 536.2, 305.20  08/17/2016 - Present        Gastritis ICD-10-CM: K29.70  ICD-9-CM: 535.50  08/16/2016 - Present        Nausea & vomiting ICD-10-CM: R11.2  ICD-9-CM: 787.01  08/16/2016 - Present         Asthma with acute exacerbation ICD-10-CM: J45.901  ICD-9-CM: 493.92  08/04/2016 - Present        Lactic acidosis ICD-10-CM: E87.2  ICD-9-CM: 276.2  07/24/2016 - Present        Leukocytosis ICD-10-CM: D72.829  ICD-9-CM: 288.60  07/24/2016 - Present        Sepsis ([place]) ICD-10-CM: A41.9  ICD-9-CM: 038.9, 995.91  07/24/2016 - Present        Asthma exacerbation ICD-10-CM: J45.901  ICD-9-CM: 493.92  07/24/2016 - Present        Type 2 diabetes mellitus with nephropathy ([place]) ICD-10-CM: E11.21  ICD-9-CM: 250.40, 583.81  06/12/2016 - Present        Sacroiliitis ([place]) ICD-10-CM: M46.1  ICD-9-CM: 720.2  11/25/2015 - Present        Essential hypertension ICD-10-CM: I10  ICD-9-CM: 401.9  10/06/2015 - Present        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  ICD-9-CM: 721.3  09/15/2015 - Present        Lumbar and sacral osteoarthritis ICD-10-CM: M47.817  ICD-9-CM: 721.3  09/15/2015 - Present        Chronic pain syndrome ICD-10-CM: G89.4  ICD-9-CM: 338.4  09/15/2015 - Present        Type 2 diabetes mellitus with hyperglycemia, without long-term current use of insulin ([place]) ICD-10-CM: E11.65  ICD-9-CM: 250.00, 790.29  08/20/2015 - Present        Acute colitis ICD-10-CM: K52.9  ICD-9-CM: 558.9  07/02/2015 - Present        Acute hyperglycemia ICD-10-CM: R73.9  ICD-9-CM: 790.29  07/02/2015 - Present        Accelerated hypertension ICD-10-CM: I10  ICD-9-CM: 401.0  04/08/2015 - Present        Severe headache ICD-10-CM: R51  ICD-9-CM: 784.0  04/07/2015 - Present        Osteoarthritis of hips, 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  ICD-9-CM: 715.95  01/29/2015 - Present        PTSD (post-traumatic stress disorder) (Chronic) ICD-10-CM: F43.10  ICD-9-CM: 309.81  01/29/2015 - Present        Severe hypertension (Chronic) ICD-10-CM: I10  ICD-9-CM: 401.9  07/21/2014 - Present        RESOLVED: New onset type 1 diabetes mellitus, uncontrolled ([place]) ICD-10-CM: E10.65  ICD-9-CM: 250.03  07/02/2015 - 10/06/2015         RESOLVED: Recurrent major depressive disorder, in full remission ([place]) ICD-10-CM: F33.42  ICD-9-CM: 296.36  12/02/2014 - 12/02/2014        RESOLVED: Foreign body in colon ICD-10-CM: T18.4XXA  ICD-9-CM: 130  08/14/2014 - 01/29/2015        RESOLVED: Advanced care planning/counseling discussion ICD-10-CM: Z71.89  ICD-9-CM: V65.49  07/21/2014 - 01/29/2015    Overview Signed 07/21/2014  3:46 PM by [place]ells     Patient given [place] of [place] application               RESOLVED: Hip pain, chronic ICD-10-CM: M25.559, G89.29  ICD-9-CM: 719.45, 338.29  07/21/2014 - 01/29/2015  Medications reviewed  Current Facility-Administered Medications   Medication Dose Route Frequency   ??? LORazepam (ATIVAN) injection 1 mg  1 mg IntraVENous ON CALL   ??? docusate sodium (COLACE) capsule 100 mg  100 mg Oral BID   ??? senna (SENOKOT) tablet 8.6 mg  1 Tab Oral DAILY   ??? polyethylene glycol (MIRALAX) packet 17 g  17 g Oral BID PRN   ??? bisacodyL (DULCOLAX) suppository 10 mg  10 mg Rectal DAILY PRN   ??? bisacodyL (DULCOLAX) tablet 10 mg  10 mg Oral DAILY PRN   ??? cloNIDine HCL (CATAPRES) tablet 0.3 mg  0.3 mg Oral BID   ??? dilTIAZem CD (CARDIZEM CD) capsule 180 mg  180 mg Oral DAILY   ??? gabapentin (NEURONTIN) capsule 300 mg  300 mg Oral TID   ??? losartan (COZAAR) tablet 12.5 mg  12.5 mg Oral DAILY   ??? magnesium oxide (MAG-OX) tablet 400 mg  400 mg Oral DAILY   ??? montelukast (SINGULAIR) tablet 10 mg  10 mg Oral QHS   ??? QUEtiapine SR (SEROquel XR) tablet 150 mg  150 mg Oral QHS   ??? rosuvastatin (CRESTOR) tablet 5 mg  5 mg Oral QHS   ??? spironolactone (ALDACTONE) tablet 100 mg  100 mg Oral DAILY   ??? tiotropium bromide (SPIRIVA RESPIMAT) 2.5 mcg /actuation  1 Puff Inhalation DAILY   ??? traZODone (DESYREL) tablet 50 mg  50 mg Oral QHS PRN   ??? naloxone (NARCAN) injection 0.1 mg  0.1 mg IntraVENous PRN   ??? acetaminophen (TYLENOL) tablet 650 mg  650 mg Oral Q4H PRN    Or   ??? acetaminophen (TYLENOL) solution 650 mg  650 mg Oral Q4H PRN    Or    ??? acetaminophen (TYLENOL) suppository 650 mg  650 mg Rectal Q4H PRN   ??? HYDROcodone-acetaminophen (NORCO) 5-325 mg per tablet 1 Tab  1 Tab Oral Q6H PRN   ??? morphine injection 2 mg  2 mg IntraVENous Q6H PRN   ??? azithromycin (ZITHROMAX) tablet 250 mg  250 mg Oral DAILY   ??? diphenhydrAMINE (BENADRYL) capsule 25 mg  25 mg Oral Q4H PRN   ??? ondansetron (ZOFRAN) injection 4 mg  4 mg IntraVENous Q4H PRN   ??? prochlorperazine (COMPAZINE) with saline injection 10 mg  10 mg IntraVENous Q6H PRN   ??? enoxaparin (LOVENOX) injection 40 mg  40 mg SubCUTAneous Q24H   ??? albuterol (PROVENTIL VENTOLIN) nebulizer solution 2.5 mg  2.5 mg Nebulization Q6HWA RT   ??? polyethylene glycol (MIRALAX) packet 17 g  17 g Oral DAILY PRN   ??? predniSONE (DELTASONE) tablet 40 mg  40 mg Oral DAILY WITH BREAKFAST   ??? butalbital-acetaminophen-caffeine (FIORICET, ESGIC) 50-325-40 mg per tablet 1 Tab  1 Tab Oral Q4H PRN   ??? fluticasone-vilanterol (BREO ELLIPTA) 183mg-25mcg/puff  1 Puff Inhalation DAILY   ??? dextrose (D50) infusion 5-25 g  10-50 mL IntraVENous PRN   ??? glucagon (GLUCAGEN) injection 1 mg  1 mg IntraMUSCular PRN   ??? insulin glargine (LANTUS) injection 1-100 Units  1-100 Units SubCUTAneous QHS   ??? insulin lispro (HUMALOG) injection 1-100 Units  1-100 Units SubCUTAneous AC&HS   ??? insulin lispro (HUMALOG) injection 1-100 Units  1-100 Units SubCUTAneous PRN   ??? hydrALAZINE (APRESOLINE) 20 mg/mL injection 10 mg  10 mg IntraVENous Q6H PRN        Care Plan discussed with: Patient/Family and Nurse    Total time spent with patient: 28 minutes.    JBroadus John  Orvan July, MD  August 07, 2018  16:51

## 2018-08-07 NOTE — Progress Notes (Signed)
Problem: Falls - Risk of  Goal: *Absence of Falls  Description  Document Schmid Fall Risk and appropriate interventions in the flowsheet.  Outcome: Progressing Towards Goal  Note: Fall Risk Interventions:  Mobility Interventions: Patient to call before getting OOB, Bed/chair exit alarm, Communicate number of staff needed for ambulation/transfer         Medication Interventions: Patient to call before getting OOB, Teach patient to arise slowly, Bed/chair exit alarm    Elimination Interventions: Call light in reach, Bed/chair exit alarm, Patient to call for help with toileting needs              Problem: Gas Exchange - Impaired  Goal: *Absence of hypoxia  Outcome: Progressing Towards Goal     Problem: Pressure Injury - Risk of  Goal: *Prevention of pressure injury  Description  Document Braden Scale and appropriate interventions in the flowsheet.  Outcome: Progressing Towards Goal  Note: Pressure Injury Interventions:       Moisture Interventions: Absorbent underpads, Assess need for specialty bed, Internal/External urinary devices, Maintain skin hydration (lotion/cream), Minimize layers, Moisture barrier    Activity Interventions: Assess need for specialty bed, Increase time out of bed    Mobility Interventions: Float heels, HOB 30 degrees or less    Nutrition Interventions: Document food/fluid/supplement intake    Friction and Shear Interventions: HOB 30 degrees or less, Apply protective barrier, creams and emollients

## 2018-08-07 NOTE — Progress Notes (Signed)
Problem: Falls - Risk of  Goal: *Absence of Falls  Description  Document Schmid Fall Risk and appropriate interventions in the flowsheet.  Outcome: Progressing Towards Goal  Note: Fall Risk Interventions:  Mobility Interventions: Patient to call before getting OOB         Medication Interventions: Patient to call before getting OOB    Elimination Interventions: Call light in reach              Problem: Patient Education: Go to Patient Education Activity  Goal: Patient/Family Education  Outcome: Progressing Towards Goal     Problem: Risk for Spread of Infection  Goal: Prevent transmission of infectious organism to others  Description  Prevent the transmission of infectious organisms to other patients, staff members, and visitors.  Outcome: Progressing Towards Goal     Problem: Patient Education:  Go to Education Activity  Goal: Patient/Family Education  Outcome: Progressing Towards Goal     Problem: Gas Exchange - Impaired  Goal: *Absence of hypoxia  Outcome: Progressing Towards Goal     Problem: Pressure Injury - Risk of  Goal: *Prevention of pressure injury  Description  Document Braden Scale and appropriate interventions in the flowsheet.  Outcome: Progressing Towards Goal  Note: Pressure Injury Interventions:       Moisture Interventions: Internal/External urinary devices, Apply protective barrier, creams and emollients    Activity Interventions: Increase time out of bed, Pressure redistribution bed/mattress(bed type)    Mobility Interventions: Pressure redistribution bed/mattress (bed type)    Nutrition Interventions: Document food/fluid/supplement intake    Friction and Shear Interventions: Apply protective barrier, creams and emollients                Problem: Patient Education: Go to Patient Education Activity  Goal: Patient/Family Education  Outcome: Progressing Towards Goal

## 2018-08-07 NOTE — Progress Notes (Signed)
Bedside and Verbal shift change report given to Trinidad RN (oncoming nurse) by Everest RN (offgoing nurse). Report included the following information SBAR, Intake/Output, MAR and Recent Results.

## 2018-08-07 NOTE — Progress Notes (Addendum)
PAGER ID: [phone]   MESSAGE: Ms [name] [name] Rm [phone] just getting picked up for MRI.Thanks T [email or web address]    Off floor MRI.

## 2018-08-07 NOTE — Progress Notes (Signed)
PVL Preliminary Report:    Right Temporal Artery Duplex    Normal right temporal artery duplex exam.      Full Report to Follow.    Jennifer Clifton, BS, RVT
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
## 2018-08-07 NOTE — Progress Notes (Signed)
[facility] Assessment  (Multi Visit Utilizer)        Number of admissions in the past 12 months:   7    Was the patient discharged with services after last admission?      []  No   [x]  Yes       Do you have a PCP?   [x] Yes  Did you contact them before you came to the hospital?  []Yes  [x] No  Do you see them routinely? []Yes  [x] No        Did you understand your discharge instructions at your last discharge?    [x] Yes  []  No       Do you have transportation to and from your medical appointments?   [x] Yes   [] No     [] Handi-ride info given to patient    [] Bus pass given to patient    [] Cab voucher provided    [] Referral placed to [employer]    [x] Transportation through health plan available          Are you able to afford and pick up your medications?   [x]Yes   []No        Do you struggle to have healthy food to eat?   [x] No   [] Yes       Other:   Are you homeless?   [x] No   [] Yes           Are you struggling to care for yourself at home?   [x] No   [] Yes    []    Do you have a support system?   [x] Yes     Are you or your spouse a Veteran?   [x] No   Pt not a veteran    What sources of income do you have?           Other:  Details not reported. Pt lives with spouse " we get bills get paid"    Does the patient have a mental health/addiction diagnosis?   [x] No   Pt reports no MH/SA/AA issues.    Health Insurance Case Manager:   [] No   [x] Yes    Pt thinks she has a CM thru insurance but not sure of name of worker. SW reviewed the importance of her knowing CM. Pt state she will follow up on this after d/c. Pt given 211[employer] to find resources when family needs something. RN CM updated on [facility].

## 2018-08-07 NOTE — Progress Notes (Signed)
 MVU Assessment  (Multi Visit Utilizer)        Number of admissions in the past 12 months:   7    Was the patient discharged with services after last admission?      []   No   [x]   Yes       Do you have a PCP?   [x]  Yes  Did you contact them before you came to the hospital?  [] Yes  [x]  No  Do you see them routinely? [] Yes  [x]  No        Did you understand your discharge instructions at your last discharge?    [x]  Yes  []   No       Do you have transportation to and from your medical appointments?   [x]  Yes   []  No     []  Handi-ride info given to patient    []  Bus pass given to patient    []  Cab voucher provided    []  Referral placed to Life Coach    [x]  Transportation through health plan available          Are you able to afford and pick up your medications?   [x] Yes   [] No        Do you struggle to have healthy food to eat?   [x]  No   []  Yes       Other:   Are you homeless?   [x]  No   []  Yes           Are you struggling to care for yourself at home?   [x]  No   []  Yes    []     Do you have a support system?   [x]  Yes     Are you or your spouse a Veteran?   [x]  No   Pt not a veteran    What sources of income do you have?           Other:  Details not reported. Pt lives with spouse  we get bills get paid    Does the patient have a mental health/addiction diagnosis?   [x]  No   Pt reports no MH/SA/AA issues.    Health Insurance Case Manager:   []  No   [x]  Yes    Pt thinks she has a CM thru insurance but not sure of name of worker. SW reviewed the importance of her knowing CM. Pt state she will follow up on this after d/c. Pt given [email or web address] to find resources when family needs something. RN CM updated on MVU.

## 2018-08-07 NOTE — Progress Notes (Signed)
Problem: Falls - Risk of  Goal: *Absence of Falls  Description  Document [name] Fall Risk and appropriate interventions in the flowsheet.  Outcome: Progressing Towards Goal  Note: Fall Risk Interventions:  Mobility Interventions: Patient to call before getting OOB, Bed/chair exit alarm, Communicate number of staff needed for ambulation/transfer         Medication Interventions: Patient to call before getting OOB, Teach patient to arise slowly, Bed/chair exit alarm    Elimination Interventions: Call light in reach, Bed/chair exit alarm, Patient to call for help with toileting needs              Problem: Gas Exchange - Impaired  Goal: *Absence of hypoxia  Outcome: Progressing Towards Goal     Problem: Pressure Injury - Risk of  Goal: *Prevention of pressure injury  Description  Document Braden Scale and appropriate interventions in the flowsheet.  Outcome: Progressing Towards Goal  Note: Pressure Injury Interventions:       Moisture Interventions: Absorbent underpads, Assess need for specialty bed, Internal/External urinary devices, Maintain skin hydration (lotion/cream), Minimize layers, Moisture barrier    Activity Interventions: Assess need for specialty bed, Increase time out of bed    Mobility Interventions: Float heels, HOB 30 degrees or less    Nutrition Interventions: Document food/fluid/supplement intake    Friction and Shear Interventions: HOB 30 degrees or less, Apply protective barrier, creams and emollients

## 2018-08-07 NOTE — Progress Notes (Signed)
PAGER ID: [phone]   MESSAGE: Ms [name] [name] Rm [phone] just getting picked up for MRI.Thanks T [id number]    Off floor MRI.

## 2018-08-07 NOTE — Progress Notes (Signed)
Bedside and Verbal shift change report given to [place] Charity fundraiser (Cabin crew) by [employer] (offgoing nurse). Report included the following information SBAR, Intake/Output, MAR and Recent Results.

## 2018-08-07 NOTE — Progress Notes (Signed)
Problem: Falls - Risk of  Goal: *Absence of Falls  Description  Document [name] [name] Fall Risk and appropriate interventions in the flowsheet.  Outcome: Progressing Towards Goal  Note: Fall Risk Interventions:  Mobility Interventions: Patient to call before getting OOB         Medication Interventions: Patient to call before getting OOB    Elimination Interventions: Call light in reach              Problem: Patient Education: Go to Patient Education Activity  Goal: Patient/Family Education  Outcome: Progressing Towards Goal     Problem: Risk for Spread of Infection  Goal: Prevent transmission of infectious organism to others  Description  Prevent the transmission of infectious organisms to other patients, staff members, and visitors.  Outcome: Progressing Towards Goal     Problem: Patient Education:  Go to Education Activity  Goal: Patient/Family Education  Outcome: Progressing Towards Goal     Problem: Gas Exchange - Impaired  Goal: *Absence of hypoxia  Outcome: Progressing Towards Goal     Problem: Pressure Injury - Risk of  Goal: *Prevention of pressure injury  Description  Document Braden Scale and appropriate interventions in the flowsheet.  Outcome: Progressing Towards Goal  Note: Pressure Injury Interventions:       Moisture Interventions: Internal/External urinary devices, Apply protective barrier, creams and emollients    Activity Interventions: Increase time out of bed, Pressure redistribution bed/mattress(bed type)    Mobility Interventions: Pressure redistribution bed/mattress (bed type)    Nutrition Interventions: Document food/fluid/supplement intake    Friction and Shear Interventions: Apply protective barrier, creams and emollients                Problem: Patient Education: Go to Patient Education Activity  Goal: Patient/Family Education  Outcome: Progressing Towards Goal

## 2018-08-07 NOTE — Progress Notes (Signed)
Hospitalist Progress Note         [facility]    Daily Progress Note: 08/07/2018    Assessment/Plan:     1. COPD Exacerbation:  -WBC improved  -We will continue treatment with oral steroids, scheduled albuterol nebs, daily Spiriva  -Add Breo for home Advair, continue home Singulair  -We will continue p.o. azithromycin  -O2 support as needed  ??  2.  Headache  -Treat for hypertension as below  -Treat supportively with p.o. Fioricet and IV Compazine as needed  -PRN norco and IV morphine if needed for more severe pain.  -ESR of 35, Negative CRP  -Normal R temporal artery duplex exam  -Awaiting MRI  -BP better controlled.  ??  3.  Hypertension:  -Continue home clonidine, diltiazem, losartan, Aldactone  -Improved, but HA Continues  -We will have IV hydralazine if needed for markedly elevated blood pressures.  ??  4.  Diabetes:  -A1c of 6 earlier this month  -We will order Glucomander at standard regimen given steroid use  -Holding home metformin  ??  5.  Peripheral neuropathy:  -Continue home gabapentin  ??  6.  Chronic right hip pain  -Does not appear to be on a scheduled medication for this at home  -Spouse reports no longer takes nabumetone  ??  7.  Marijuana abuse:  -Counseled on cessation given COPD  ??  8.  PTSD:  -Continue home Seroquel nightly  -Trazodone nightly as needed  ??  9.  Hyperlipidemia:  -Continue home Crestor    10. Constipation:  -Add bowel regimen.  ??  Prophylaxis: Lovenox subcu daily    Dispo:  -Resp status seems improved  -Awaiting MRI, likely d/c after.    Subjective:     Pt reports continued HA today, but SOB improved. No other new complaints.    General ROS: (-) for fever, chills,  Respiratory ROS: Improving cough, shortness of breath, wheezing  Cardiovascular ROS: (-) for chest pain, palpitations  Gastrointestinal ROS: (+) for constipation, (-) for abdominal pain, nausea,vomiting, diarrhea  Neuro ROS: (+) for R sided HA, (-) for changes in hearing, vision, focal numbness, weakness,  tingling    Objective:   Physical Exam:     Visit Vitals  BP 102/84 (BP 1 Location: Right arm, BP Patient Position: Supine)   Pulse 82   Temp 99.1 ??F (37.3 ??C)   Resp 18   Ht '5\' 9"'$  (1.753 m)   Wt 85.3 kg (188 lb)   SpO2 96%   BMI 27.76 kg/m??    O2 Flow Rate (L/min): 2 l/min(off and on) O2 Device: Room air    Temp (24hrs), Avg:98.2 ??F (36.8 ??C), Min:97.7 ??F (36.5 ??C), Max:99.1 ??F (37.3 ??C)    No intake/output data recorded.   02/23 1901 - 02/25 0700  In: 69 [P.O.:720]  Out: 3350 [Urine:3350]    General: Alert, Overweight AAF, pleasant to conversation, in no acute distress, on room air  CV: Regular rate and rhythm, no murmurs, rubs, gallops  Pulm: Lungs clear, no wheeze at this time,  no crackles, rhonchi  GI: Soft, nontender, nondistended, normal bowel sounds  Extremity: No clubbing, cyanosis, no pitting lower leg edema, capillary refill time intact to distal fingertips  Skin: Warm, dry    Data Review:       24 Hour Results:  Recent Results (from the past 24 hour(s))   GLUCOSE, POC    Collection Time: 08/06/18  5:22 PM   Result Value Ref Range  Glucose (POC) 229 (H) 65 - 105 mg/dL   GLUCOSE, POC    Collection Time: 08/06/18  9:48 PM   Result Value Ref Range    Glucose (POC) 307 (H) 65 - 105 mg/dL   CBC WITH AUTOMATED DIFF    Collection Time: 08/07/18  5:01 AM   Result Value Ref Range    WBC 11.4 (H) 4.0 - 11.0 1000/mm3    RBC 4.03 3.60 - 5.20 M/uL    HGB 11.7 (L) 13.0 - 17.2 gm/dl    HCT 36.1 (L) 37.0 - 50.0 %    MCV 89.6 80.0 - 98.0 fL    MCH 29.0 25.4 - 34.6 pg    MCHC 32.4 30.0 - 36.0 gm/dl    PLATELET 195 140 - 450 1000/mm3    MPV 12.2 (H) 6.0 - 10.0 fL    RDW-SD 43.4 36.4 - 46.3      NRBC 0 0 - 0      IMMATURE GRANULOCYTES 0.6 0.0 - 3.0 %    NEUTROPHILS 76.0 (H) 34 - 64 %    LYMPHOCYTES 14.1 (L) 28 - 48 %    MONOCYTES 9.1 1 - 13 %    EOSINOPHILS 0.0 0 - 5 %    BASOPHILS 0.2 0 - 3 %   GLUCOSE, POC    Collection Time: 08/07/18  8:37 AM   Result Value Ref Range    Glucose (POC) 136 (H) 65 - 105 mg/dL   GLUCOSE,  POC    Collection Time: 08/07/18 12:43 PM   Result Value Ref Range    Glucose (POC) 159 (H) 65 - 105 mg/dL       Problem List:  Problem List as of 08/07/2018 Date Reviewed: 04/30/2018          Codes Class Noted - Resolved    Headache ICD-10-CM: R51  ICD-9-CM: 784.0  08/05/2018 - Present        Abdominal pain ICD-10-CM: R10.9  ICD-9-CM: 789.00  02/04/2018 - Present        COPD exacerbation ([place]) ICD-10-CM: J44.1  ICD-9-CM: 491.21  11/14/2017 - Present        SIRS (systemic inflammatory response syndrome) (HCC) ICD-10-CM: R65.10  ICD-9-CM: 995.90  10/09/2017 - Present        Hyperkalemia ICD-10-CM: E87.5  ICD-9-CM: 276.7  09/08/2017 - Present        Obtunded ICD-10-CM: R40.1  ICD-9-CM: 780.09  09/08/2017 - Present        Acute renal failure (ARF) (HCC) ICD-10-CM: N17.9  ICD-9-CM: 584.9  09/08/2017 - Present        Septic shock (HCC) ICD-10-CM: A41.9, R65.21  ICD-9-CM: 038.9, 785.52, 995.92  09/08/2017 - Present        Metabolic encephalopathy QMV-78-IO: G93.41  ICD-9-CM: 348.31  09/08/2017 - Present        Dehydration ICD-10-CM: E86.0  ICD-9-CM: 276.51  08/07/2017 - Present        COPD with acute exacerbation (HCC) ICD-10-CM: J44.1  ICD-9-CM: 491.21  08/07/2017 - Present        Acute-on-chronic kidney injury ([place]) ICD-10-CM: N17.9, N18.9  ICD-9-CM: 584.9, 585.9  08/07/2017 - Present        Chest pain ICD-10-CM: R07.9  ICD-9-CM: 786.50  06/15/2017 - Present        Dyspnea ICD-10-CM: R06.00  ICD-9-CM: 786.09  06/15/2017 - Present        Type 2 diabetes mellitus with diabetic neuropathy (HCC) ICD-10-CM: E11.40  ICD-9-CM: 250.60, 357.2  12/07/2016 - Present  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  ICD-9-CM: 569.89  08/17/2016 - Present        Cannabinoid hyperemesis syndrome ([place]) ICD-10-CM: F12.988  ICD-9-CM: 536.2, 305.20  08/17/2016 - Present        Gastritis ICD-10-CM: K29.70  ICD-9-CM: 535.50  08/16/2016 - Present        Nausea & vomiting ICD-10-CM: R11.2  ICD-9-CM: 787.01  08/16/2016 - Present        Asthma with acute exacerbation  ICD-10-CM: J45.901  ICD-9-CM: 493.92  08/04/2016 - Present        Lactic acidosis ICD-10-CM: E87.2  ICD-9-CM: 276.2  07/24/2016 - Present        Leukocytosis ICD-10-CM: D72.829  ICD-9-CM: 288.60  07/24/2016 - Present        Sepsis ([place]) ICD-10-CM: A41.9  ICD-9-CM: 038.9, 995.91  07/24/2016 - Present        Asthma exacerbation ICD-10-CM: J45.901  ICD-9-CM: 493.92  07/24/2016 - Present        Type 2 diabetes mellitus with nephropathy ([place]) ICD-10-CM: E11.21  ICD-9-CM: 250.40, 583.81  06/12/2016 - Present        Sacroiliitis ([place]) ICD-10-CM: M46.1  ICD-9-CM: 720.2  11/25/2015 - Present        Essential hypertension ICD-10-CM: I10  ICD-9-CM: 401.9  10/06/2015 - Present        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  ICD-9-CM: 721.3  09/15/2015 - Present        Lumbar and sacral osteoarthritis ICD-10-CM: M47.817  ICD-9-CM: 721.3  09/15/2015 - Present        Chronic pain syndrome ICD-10-CM: G89.4  ICD-9-CM: 338.4  09/15/2015 - Present        Type 2 diabetes mellitus with hyperglycemia, without long-term current use of insulin ([place]) ICD-10-CM: E11.65  ICD-9-CM: 250.00, 790.29  08/20/2015 - Present        Acute colitis ICD-10-CM: K52.9  ICD-9-CM: 558.9  07/02/2015 - Present        Acute hyperglycemia ICD-10-CM: R73.9  ICD-9-CM: 790.29  07/02/2015 - Present        Accelerated hypertension ICD-10-CM: I10  ICD-9-CM: 401.0  04/08/2015 - Present        Severe headache ICD-10-CM: R51  ICD-9-CM: 784.0  04/07/2015 - Present        Osteoarthritis of hips, 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  ICD-9-CM: 715.95  01/29/2015 - Present        PTSD (post-traumatic stress disorder) (Chronic) ICD-10-CM: F43.10  ICD-9-CM: 309.81  01/29/2015 - Present        Severe hypertension (Chronic) ICD-10-CM: I10  ICD-9-CM: 401.9  07/21/2014 - Present        RESOLVED: New onset type 1 diabetes mellitus, uncontrolled ([place]) ICD-10-CM: E10.65  ICD-9-CM: 250.03  07/02/2015 - 10/06/2015        RESOLVED: Recurrent major depressive disorder, in full  remission ([place]) ICD-10-CM: F33.42  ICD-9-CM: 296.36  12/02/2014 - 12/02/2014        RESOLVED: Foreign body in colon ICD-10-CM: T18.4XXA  ICD-9-CM: 191  08/14/2014 - 01/29/2015        RESOLVED: Advanced care planning/counseling discussion ICD-10-CM: Z71.89  ICD-9-CM: V65.49  07/21/2014 - 01/29/2015    Overview Signed 07/21/2014  3:46 PM by [place] B[place]     Patient given [place] application               RESOLVED: Hip pain, chronic ICD-10-CM: M25.559, G89.29  ICD-9-CM: 719.45, 338.29  07/21/2014 - 01/29/2015  Medications reviewed  Current Facility-Administered Medications   Medication Dose Route Frequency   ??? LORazepam (ATIVAN) injection 1 mg  1 mg IntraVENous ON CALL   ??? docusate sodium (COLACE) capsule 100 mg  100 mg Oral BID   ??? senna (SENOKOT) tablet 8.6 mg  1 Tab Oral DAILY   ??? polyethylene glycol (MIRALAX) packet 17 g  17 g Oral BID PRN   ??? bisacodyL (DULCOLAX) suppository 10 mg  10 mg Rectal DAILY PRN   ??? bisacodyL (DULCOLAX) tablet 10 mg  10 mg Oral DAILY PRN   ??? cloNIDine HCL (CATAPRES) tablet 0.3 mg  0.3 mg Oral BID   ??? dilTIAZem CD (CARDIZEM CD) capsule 180 mg  180 mg Oral DAILY   ??? gabapentin (NEURONTIN) capsule 300 mg  300 mg Oral TID   ??? losartan (COZAAR) tablet 12.5 mg  12.5 mg Oral DAILY   ??? magnesium oxide (MAG-OX) tablet 400 mg  400 mg Oral DAILY   ??? montelukast (SINGULAIR) tablet 10 mg  10 mg Oral QHS   ??? QUEtiapine SR (SEROquel XR) tablet 150 mg  150 mg Oral QHS   ??? rosuvastatin (CRESTOR) tablet 5 mg  5 mg Oral QHS   ??? spironolactone (ALDACTONE) tablet 100 mg  100 mg Oral DAILY   ??? tiotropium bromide (SPIRIVA RESPIMAT) 2.5 mcg /actuation  1 Puff Inhalation DAILY   ??? traZODone (DESYREL) tablet 50 mg  50 mg Oral QHS PRN   ??? naloxone (NARCAN) injection 0.1 mg  0.1 mg IntraVENous PRN   ??? acetaminophen (TYLENOL) tablet 650 mg  650 mg Oral Q4H PRN    Or   ??? acetaminophen (TYLENOL) solution 650 mg  650 mg Oral Q4H PRN    Or   ??? acetaminophen (TYLENOL) suppository 650 mg  650 mg Rectal Q4H  PRN   ??? HYDROcodone-acetaminophen (NORCO) 5-325 mg per tablet 1 Tab  1 Tab Oral Q6H PRN   ??? morphine injection 2 mg  2 mg IntraVENous Q6H PRN   ??? azithromycin (ZITHROMAX) tablet 250 mg  250 mg Oral DAILY   ??? diphenhydrAMINE (BENADRYL) capsule 25 mg  25 mg Oral Q4H PRN   ??? ondansetron (ZOFRAN) injection 4 mg  4 mg IntraVENous Q4H PRN   ??? prochlorperazine (COMPAZINE) with saline injection 10 mg  10 mg IntraVENous Q6H PRN   ??? enoxaparin (LOVENOX) injection 40 mg  40 mg SubCUTAneous Q24H   ??? albuterol (PROVENTIL VENTOLIN) nebulizer solution 2.5 mg  2.5 mg Nebulization Q6HWA RT   ??? polyethylene glycol (MIRALAX) packet 17 g  17 g Oral DAILY PRN   ??? predniSONE (DELTASONE) tablet 40 mg  40 mg Oral DAILY WITH BREAKFAST   ??? butalbital-acetaminophen-caffeine (FIORICET, ESGIC) 50-325-40 mg per tablet 1 Tab  1 Tab Oral Q4H PRN   ??? fluticasone-vilanterol (BREO ELLIPTA) 168mg-25mcg/puff  1 Puff Inhalation DAILY   ??? dextrose (D50) infusion 5-25 g  10-50 mL IntraVENous PRN   ??? glucagon (GLUCAGEN) injection 1 mg  1 mg IntraMUSCular PRN   ??? insulin glargine (LANTUS) injection 1-100 Units  1-100 Units SubCUTAneous QHS   ??? insulin lispro (HUMALOG) injection 1-100 Units  1-100 Units SubCUTAneous AC&HS   ??? insulin lispro (HUMALOG) injection 1-100 Units  1-100 Units SubCUTAneous PRN   ??? hydrALAZINE (APRESOLINE) 20 mg/mL injection 10 mg  10 mg IntraVENous Q6H PRN        Care Plan discussed with: Patient/Family and Nurse    Total time spent with patient: 28 minutes.    JBroadus [name] [name]  Orvan July, MD  August 07, 2018  16:51

## 2018-08-07 NOTE — Progress Notes (Signed)
PVL Preliminary Report:    Right Temporal Artery Duplex    Normal right temporal artery duplex exam.      Full Report to Follow.    Eddie North, BS, RVT
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
## 2018-08-08 LAB — POCT GLUCOSE
POC Glucose: 187 mg/dL — ABNORMAL HIGH (ref 65–105)
POC Glucose: 99 mg/dL (ref 65–105)
POC Glucose: 110 mg/dL — ABNORMAL HIGH (ref 65–105)

## 2018-08-08 LAB — GLUCOSE, POC
Glucose (POC): 187 mg/dL — ABNORMAL HIGH (ref 65–105)
Glucose (POC): 99 mg/dL (ref 65–105)
Glucose (POC): 110 mg/dL — ABNORMAL HIGH (ref 65–105)

## 2018-08-08 MED ORDER — BUTALBITAL-ACETAMINOPHEN-CAFFEINE 50 MG-325 MG-40 MG TAB
50-325-40 mg | ORAL_TABLET | Freq: Four times a day (QID) | ORAL | 1 refills | Status: DC | PRN
Start: 2018-08-08 — End: 2019-01-16

## 2018-08-08 MED ORDER — AZITHROMYCIN 250 MG TAB
250 mg | ORAL_TABLET | Freq: Every day | ORAL | 0 refills | Status: AC
Start: 2018-08-08 — End: 2018-08-10

## 2018-08-08 MED ORDER — PREDNISONE 20 MG TAB
20 mg | ORAL_TABLET | Freq: Every day | ORAL | 0 refills | Status: AC
Start: 2018-08-08 — End: 2018-08-10

## 2018-08-08 MED FILL — SPIRONOLACTONE 25 MG TAB: 25 mg | ORAL | Qty: 4

## 2018-08-08 MED FILL — PREDNISONE 20 MG TAB: 20 mg | ORAL | Qty: 2

## 2018-08-08 MED FILL — DOCUSATE SODIUM 100 MG CAP: 100 mg | ORAL | Qty: 1

## 2018-08-08 MED FILL — SENNA LAX 8.6 MG TABLET: 8.6 mg | ORAL | Qty: 1

## 2018-08-08 MED FILL — ALBUTEROL SULFATE 0.083 % (0.83 MG/ML) SOLN FOR INHALATION: 2.5 mg /3 mL (0.083 %) | RESPIRATORY_TRACT | Qty: 1

## 2018-08-08 MED FILL — LOSARTAN 25 MG TAB: 25 mg | ORAL | Qty: 1

## 2018-08-08 MED FILL — AZITHROMYCIN 250 MG TAB: 250 mg | ORAL | Qty: 1

## 2018-08-08 MED FILL — ENOXAPARIN 40 MG/0.4 ML SUB-Q SYRINGE: 40 mg/0.4 mL | SUBCUTANEOUS | Qty: 0.4

## 2018-08-08 MED FILL — DILTIAZEM ER 180 MG 24 HR CAP: 180 mg | ORAL | Qty: 1

## 2018-08-08 MED FILL — CLONIDINE 0.1 MG TAB: 0.1 mg | ORAL | Qty: 3

## 2018-08-08 MED FILL — GABAPENTIN 300 MG CAP: 300 mg | ORAL | Qty: 1

## 2018-08-08 MED FILL — PROCHLORPERAZINE EDISYLATE 5 MG/ML INJECTION: 5 mg/mL | INTRAMUSCULAR | Qty: 2

## 2018-08-08 MED FILL — ROSUVASTATIN 5 MG TAB: 5 mg | ORAL | Qty: 1

## 2018-08-08 MED FILL — BISACODYL 5 MG TAB, DELAYED RELEASE: 5 mg | ORAL | Qty: 2

## 2018-08-08 MED FILL — MONTELUKAST 10 MG TAB: 10 mg | ORAL | Qty: 1

## 2018-08-08 MED FILL — MAGNESIUM OXIDE 400 MG TAB: 400 mg | ORAL | Qty: 1

## 2018-08-08 MED FILL — QUETIAPINE SR 50 MG 24 HR TAB: 50 mg | ORAL | Qty: 3

## 2018-08-08 NOTE — Progress Notes (Signed)
Problem: Falls - Risk of  Goal: *Absence of Falls  Description  Document [name] [name] Fall Risk and appropriate interventions in the flowsheet.  Outcome: Resolved/Met  Note: Fall Risk Interventions:  Mobility Interventions: Bed/chair exit alarm, Communicate number of staff needed for ambulation/transfer         Medication Interventions: Patient to call before getting OOB, Teach patient to arise slowly    Elimination Interventions: Patient to call for help with toileting needs              Problem: Patient Education: Go to Patient Education Activity  Goal: Patient/Family Education  Outcome: Resolved/Met     Problem: Risk for Spread of Infection  Goal: Prevent transmission of infectious organism to others  Description  Prevent the transmission of infectious organisms to other patients, staff members, and visitors.  Outcome: Resolved/Met     Problem: Patient Education:  Go to Education Activity  Goal: Patient/Family Education  Outcome: Resolved/Met     Problem: Artist - Impaired  Goal: *Absence of hypoxia  Outcome: Resolved/Met     Problem: Pressure Injury - Risk of  Goal: *Prevention of pressure injury  Description  Document Braden Scale and appropriate interventions in the flowsheet.  Outcome: Resolved/Met  Note: Pressure Injury Interventions:       Moisture Interventions: Absorbent underpads, Limit adult briefs, Minimize layers    Activity Interventions: Pressure redistribution bed/mattress(bed type)    Mobility Interventions: Pressure redistribution bed/mattress (bed type), HOB 30 degrees or less    Nutrition Interventions: Document food/fluid/supplement intake    Friction and Shear Interventions: Apply protective barrier, creams and emollients, HOB 30 degrees or less, Minimize layers, Transfer aides:transfer board/Hoyer lift/ceiling lift, Transferring/repositioning devices                Problem: Patient Education: Go to Patient Education Activity  Goal: Patient/Family Education  Outcome: Resolved/Met

## 2018-08-08 NOTE — Other (Addendum)
----------  DocumentID: [id number]------------------------------------------------              [facility]                       Patient Education Report         Name: [name] [name], [name] [name]                  Date: 08/05/2018    MRN: [id number]                    Time: 3:02:52 AM         Patient ordered video: 'Patient Safety: Stay Safe While you are in the Hospital'    from [id number] via phone number: [phone] at 3:02:52 AM    Description: This program outlines some of the precautions patients can take to ensure a speedy recovery without extra complications. The video emphasizes the importance of communicating with the healthcare team.    ----------DocumentID: [id number]------------------------------------------------                       [facility]          Patient Education Report - Discharge Summary        Date: 08/08/2018   Time: 5:47:30 PM   Name: [name] [name], [name] [name]   MRN: [id number]      Account Number: [id number]      Education History:        Patient ordered video: 'Patient Safety: Stay Safe While you are in the Hospital' from [id number] on 08/05/2018 03:02:52 AM

## 2018-08-08 NOTE — Progress Notes (Signed)
Problem: Falls - Risk of  Goal: *Absence of Falls  Description  Document Schmid Fall Risk and appropriate interventions in the flowsheet.  Outcome: Resolved/Met  Note: Fall Risk Interventions:  Mobility Interventions: Bed/chair exit alarm, Communicate number of staff needed for ambulation/transfer         Medication Interventions: Patient to call before getting OOB, Teach patient to arise slowly    Elimination Interventions: Patient to call for help with toileting needs              Problem: Patient Education: Go to Patient Education Activity  Goal: Patient/Family Education  Outcome: Resolved/Met     Problem: Risk for Spread of Infection  Goal: Prevent transmission of infectious organism to others  Description  Prevent the transmission of infectious organisms to other patients, staff members, and visitors.  Outcome: Resolved/Met     Problem: Patient Education:  Go to Education Activity  Goal: Patient/Family Education  Outcome: Resolved/Met     Problem: Gas Exchange - Impaired  Goal: *Absence of hypoxia  Outcome: Resolved/Met     Problem: Pressure Injury - Risk of  Goal: *Prevention of pressure injury  Description  Document Braden Scale and appropriate interventions in the flowsheet.  Outcome: Resolved/Met  Note: Pressure Injury Interventions:       Moisture Interventions: Absorbent underpads, Limit adult briefs, Minimize layers    Activity Interventions: Pressure redistribution bed/mattress(bed type)    Mobility Interventions: Pressure redistribution bed/mattress (bed type), HOB 30 degrees or less    Nutrition Interventions: Document food/fluid/supplement intake    Friction and Shear Interventions: Apply protective barrier, creams and emollients, HOB 30 degrees or less, Minimize layers, Transfer aides:transfer board/Hoyer lift/ceiling lift, Transferring/repositioning devices                Problem: Patient Education: Go to Patient Education Activity  Goal: Patient/Family Education  Outcome: Resolved/Met      Problem: Falls - Risk of  Goal: *Absence of Falls  Description  Document Schmid Fall Risk and appropriate interventions in the flowsheet.  Outcome: Resolved/Met  Note: Fall Risk Interventions:  Mobility Interventions: Bed/chair exit alarm, Communicate number of staff needed for ambulation/transfer         Medication Interventions: Patient to call before getting OOB, Teach patient to arise slowly    Elimination Interventions: Patient to call for help with toileting needs              Problem: Patient Education: Go to Patient Education Activity  Goal: Patient/Family Education  Outcome: Resolved/Met     Problem: Pressure Injury - Risk of  Goal: *Prevention of pressure injury  Description  Document Braden Scale and appropriate interventions in the flowsheet.  Outcome: Resolved/Met  Note: Pressure Injury Interventions:       Moisture Interventions: Absorbent underpads, Limit adult briefs, Minimize layers    Activity Interventions: Pressure redistribution bed/mattress(bed type)    Mobility Interventions: Pressure redistribution bed/mattress (bed type), HOB 30 degrees or less    Nutrition Interventions: Document food/fluid/supplement intake    Friction and Shear Interventions: Apply protective barrier, creams and emollients, HOB 30 degrees or less, Minimize layers, Transfer aides:transfer board/Hoyer lift/ceiling lift, Transferring/repositioning devices                Problem: Patient Education: Go to Patient Education Activity  Goal: Patient/Family Education  Outcome: Resolved/Met

## 2018-08-08 NOTE — Other (Signed)
Page Sent       PAGER ID: [phone]   MESSAGE: Hi ,Kindly place an IV line on pt in [place]. Lija [phone]

## 2018-08-08 NOTE — Progress Notes (Signed)
Discharge Date: 08/08/2018    Discharge Location: Home    Discharge Needs: [employer]     Transportation: [employer]     Communication with: Patient, RN, MD

## 2018-08-08 NOTE — Other (Signed)
Bedside and Verbal shift change report given to Lija Philip (oncoming nurse) by Eze Everest RN (offgoing nurse). Report included the following information SBAR, Kardex and MAR.

## 2018-08-08 NOTE — Other (Signed)
PAGER ID: [phone]   MESSAGE: hi,Kindly place an IV on [phone],[name].[id number]

## 2018-08-08 NOTE — Progress Notes (Signed)
Transport to : HOME  Reason : HOSPITAL D/C  transport set up with : [employer]  TRANSPORT [phone]  Time/date   08/08/2018 @ 5:30PM  Dc summary loaded N/A  nurse/cm notified   YES  envelope delivered   YES  Insurance verified on face sheet  YES  auth needed    YES  auth # [id number]  INSTRUCTED TO ARRIVE AT [facility] AND CALL [phone]

## 2018-08-08 NOTE — Discharge Summary (Signed)
Hospitalist Discharge Summary      Discharge Summary   Admit Date: 08/04/2018  Discharge Date:  08/08/18      Patient ID:  [name] [name]  68 y.o.  Sep 10, 1950    Chief Complaint   Patient presents with   ??? Headache   ??? Hypertension       Discharge Diagnoses  1.??COPD Exacerbation: Improving  -WBC improved  -We will continue treatment with oral steroids and azithromycin for one more day  -Pt reports she has adequate supplies of home medications available.  ??  2. ??Headache: unspecified type  -Continue despite improved hypertension  -ESR of 35, Negative CRP  -Normal R temporal artery duplex exam  -MRI shows chronic left posterior fossa dural based mass consistent with meningioma which patient reports is chronic, and extensive chronic microvascular ischemic changes but no other acute findings  -At this point will treat supportively and will give prescription for Fioricet at discharge.  ??  3. ??Hypertension:  -Continue home clonidine, diltiazem, losartan,??Aldactone  -Improved from intake.  ??  4. ??Diabetes:  -A1c of 6 earlier this month  -Resume home medications  ??  5. ??Peripheral neuropathy:  -Continue home gabapentin  ??  6. ??Chronic right hip pain  -Does not appear to be on a scheduled medication for this at home  -Spouse reports no longer takes nabumetone  ??  7. ??Marijuana abuse:  -Counseled on cessation given COPD  ??  8. ??PTSD:   -Continue home Seroquel nightly  -Trazodone nightly as needed  ??  9. ??Hyperlipidemia:  -Continue home Crestor  ??  10. Constipation:  -Add bowel regimen.  -Patient reports no recent bowel movement for several days but she also denies severe abdominal pain or feeling of having to pass stool and being unable as well as nausea or vomiting.   -She may use over-the-counter medications as needed to treat for constipation.    Current Discharge Medication List      START taking these medications    Details   azithromycin (ZITHROMAX) 250 mg tablet Take 1 Tab by mouth daily for 1  day. Indications: COPD exacerbation  Qty: 1 Tab, Refills: 0      butalbital-acetaminophen-caffeine (FIORICET, ESGIC) 50-325-40 mg per tablet Take 1 Tab by mouth every six (6) hours as needed for Headache. Indications: tension headache  Qty: 30 Tab, Refills: 1      predniSONE (DELTASONE) 20 mg tablet Take 40 mg by mouth daily (with breakfast) for 1 day. Indications: copd exacerbation  Qty: 2 Tab, Refills: 0         CONTINUE these medications which have NOT CHANGED    Details   LANTUS SOLOSTAR U-100 INSULIN 100 unit/mL (3 mL) inpn INJECT 32 UNITS SUBCUTANEOUSLY  NIGHTLY  Qty: 15 mL, Refills: 0    Associated Diagnoses: Type 2 diabetes mellitus with diabetic neuropathy, without long-term current use of insulin (HCC)      albuterol (ACCUNEB) 1.25 mg/3 mL nebu USE 1 VIAL IN NEBULIZER EVERY 6 HOURS AS NEEDED FOR  SHORTNESS  OF  BREATH,  WHEEZING  Qty: 270 mL, Refills: 1      losartan (COZAAR) 25 mg tablet Take 0.5 Tabs by mouth daily.  Qty: 30 Tab, Refills: 2      cloNIDine HCl (CATAPRES) 0.1 mg tablet Take 3 Tabs by mouth two (2) times a day.  Qty: 60 Tab, Refills: 1      spironolactone (ALDACTONE) 100 mg tablet TAKE 1 TABLET EVERY DAY  Qty: 90 Tab,  Refills: 0    Associated Diagnoses: Severe hypertension      metFORMIN ER (GLUCOPHAGE XR) 500 mg tablet TAKE 1 TABLET TWICE DAILY  Qty: 180 Tab, Refills: 1      montelukast (SINGULAIR) 10 mg tablet TAKE 1 TABLET EVERY DAY  Qty: 90 Tab, Refills: 3      gabapentin (NEURONTIN) 300 mg capsule Take 1 Cap by mouth three (3) times daily. Max Daily Amount: 900 mg.  Qty: 270 Cap, Refills: 1    Associated Diagnoses: Chronic right hip pain      magnesium oxide (MAG-OX) 400 mg tablet Take 400 mg by mouth daily.      dilTIAZem CD (CARDIZEM CD) 180 mg ER capsule Take 1 Cap by mouth daily.  Qty: 90 Cap, Refills: 1      albuterol-ipratropium (DUO-NEB) 2.5 mg-0.5 mg/3 ml nebu 3 mL by Nebulization route every six (6) hours as needed.       tiotropium bromide (SPIRIVA RESPIMAT) 2.5 mcg/actuation inhaler Take 1 Puff by inhalation daily.  Qty: 1 Inhaler, Refills: 0    Associated Diagnoses: COPD with acute exacerbation (HCC)      fluticasone propion-salmeterol (ADVAIR DISKUS) 100-50 mcg/dose diskus inhaler Take 1 Puff by inhalation two (2) times a day.  Qty: 1 Inhaler, Refills: 0      rosuvastatin (CRESTOR) 5 mg tablet TAKE 1 TABLET BY MOUTH NIGHTLY  Qty: 90 Tab, Refills: 1    Associated Diagnoses: Type 2 diabetes mellitus with diabetic neuropathy, without long-term current use of insulin (HCC)      polyethylene glycol (MIRALAX) 17 gram packet Take 1 Packet by mouth daily.  Qty: 30 Packet, Refills: 1      QUEtiapine SR (SEROQUEL XR) 150 mg sr tablet Take 150 mg by mouth nightly.      ondansetron (ZOFRAN ODT) 4 mg disintegrating tablet Take 1 Tab by mouth every eight (8) hours as needed for Nausea. Indications: nausea  Qty: 12 Tab, Refills: 0      traZODone (DESYREL) 50 mg tablet Take 1 Tab by mouth nightly as needed for Sleep.  Qty: 20 Tab, Refills: 0               Initial presentation:  From HPI:  [name] [name] is a 68 y.o. 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  She reports headache is her chief complaint and reports this is been intractable, with a pounding sensation originally on the left side but now more on the right side.  She states this typically is worse when her blood pressure is elevated.  Blood pressures were intake were up to the 098J systolic.  However she denies any change in hearing, vision, or any acute numbness or weakness.  Although she reports chronic numbness and tingling at the toes bilaterally, for which she states she takes gabapentin.  She  also reports some cough, wheeze, and white sputum production for the last 3 to 4 days as well.  She reports headache is unchanged after initial treatment in the emergency department including IV Compazine and Benadryl.  No other exacerbating or relieving factors are identified.  She denies any fevers, chills, nausea, vomiting, changes in urinary habits.  She is asked to  be admitted for further evaluation and treatment.    She was made with plans to treat for COPD exacerbation with p.o. azithromycin and oral prednisone, scheduled albuterol nebs, daily Spiriva, Breo for her home Advair, and to continue Singulair.  To monitor CBC, to treat for hypertension and see if headache would improve with this with plans resume her home medications as well as to check an ESR.  Supportive medication with Fioricet, IV Compazine, and PRN Norco and morphine were added if needed.  Otherwise plans made to continue her medications were possible, adding Glucomander for diabetes.    Hospital Course:   Patient's blood pressure and shortness of breath improved fairly quickly.  However headache was persistent, despite improvement in blood pressure.  ESR returned slightly elevated at 35, plans are made check a CRP which was negative, right temporal artery ultrasound also showed no specific findings.  An MRI of the brain was ordered which was delayed until the evening of the 25th.  However this also showed no acute findings, but did show meningioma which patient reports is been present in the past.  At this point her headache feels somewhat better.  We will plan to discharge with Fioricet for as needed treatment for headache.  She does report no recent bowel movement but denies any alarm symptoms.  Otherwise she is plan for discharge today    Imaging:  Xr Chest Pa Lat    Result Date: 08/05/2018  INDICATION: shortness of breath   EXAMINATION: XR CHEST PA LAT COMPARISON: 02/18/2018 FINDINGS: The study shows a normal sized heart.. Stable 2 cm  nodule at the left lower lobe. Known bilateral bullae.Marland Kitchen        IMPRESSION: 1. Stable 2 cm left lower lobe lung nodule. 2. Bullous emphysema.       Temporal Artery Duplex Report  08/07/18  ??  Name: [name] [name], [name] [name] Date: 08/07/2018 09:54 AM  MRN: [id number]                    Patient Location: [id number]  DOB: Feb 11, 1951                Age: 45 yrs  Gender: Female                 Account #: [id number]  Reason For Study: Headaches  Ordering Physician: Marzella Schlein  ??  Performed By: Rito Ehrlich, RVT  ??  Interpretation Summary  Normal velocities were dected in all vessels.  No sign of halo detected around the parietal, frontal and temporal arteries.          MRI BRAIN WO CONT: 08/07/18  ??  CLINICAL INDICATION: intractable HA       ??  COMPARISON:  April 09, 2015  ??  TECHNIQUE: T1 and T2-weighted sequences of the brain were obtained in multiple  planes without contrast.  ??  FINDINGS:   No restricted diffusion. Stable left posterior fossa dural based mass 10 mm,  abutting tentorium. Isointense on T1 and T2. Findings typical of meningioma.  ??  Stable left frontal scalp subgaleal lipoma. Small right frontal subgaleal  lipoma.  No  no midline shift. No hydrocephalus. No extra-axial fluid collections.  Visualized portions of the orbits grossly unremarkable.  Mastoid air cells well aerated. Visualized portions of paranasal sinuses  demonstrate mucosal thickening posterior right ethmoid air cells and right  sphenoid sinus.  ??  Intracranial flow voids of great vessels,  grossly intact. Extensive confluent T2  prolongation in corona radiata and centrum semiovale, more than expected for  patient's age. Extensive subcortical T2 prolongation in the frontoparietal  lobes. T2 prolongation central pons.  Marked frontotemporal lobe atrophy. No susceptibility artifact on gradient echo  imaging to suggest hemorrhage.  Cerebellar tonsil normal.  ??  IMPRESSION  IMPRESSION:   1.   Stable left posterior fossa dural based mass. Long-term stability would be  consistent with meningioma.   2. No acute infarction.  3. Extensive chronic microvascular ischemic change      Physical Exam on Discharge:  Visit Vitals  BP 124/81 (BP 1 Location: Right arm, BP Patient Position: Supine)   Pulse 85   Temp 98.6 ??F (37 ??C)   Resp 16   Ht _0  (1.753 m)   Wt 85.3 kg (188 lb)   SpO2 100%   BMI 27.76 kg/m??     General: Alert,??Overweight AAF, pleasant to conversation, in no acute distress  CV: Regular rate and rhythm, no murmurs, rubs, gallops  Pulm: Lungs??clear, no wheeze at this time, ??no??crackles, rhonchi  GI: Soft, nontender, nondistended, mildly hypoactive bowel sounds  Extremity: No clubbing, cyanosis, no pitting lower leg edema, capillary refill time intact to distal fingertips  Skin: Warm, dry    Most Recent BMP and CBC:    Lab Results   Component Value Date/Time    Sodium 139 08/06/2018 08:42 AM    Potassium 4.0 08/06/2018 08:42 AM    Chloride 106 08/06/2018 08:42 AM    CO2 29 08/06/2018 08:42 AM    Anion gap 4 (L) 08/06/2018 08:42 AM    Glucose 179 (H) 08/06/2018 08:42 AM    BUN 21 08/06/2018 08:42 AM    Creatinine 1.0 08/06/2018 08:42 AM    BUN/Creatinine ratio 10 (L) 09/20/2017 04:05 PM    GFR est AA >60.0 08/06/2018 08:42 AM    GFR est non-AA 59 08/06/2018 08:42 AM    Calcium 8.8 08/06/2018 08:42 AM      Lab Results   Component Value Date/Time    WBC 11.4 (H) 08/07/2018 05:01 AM    Hemoglobin (POC) 11.8 12/16/2014 02:13 PM    HGB 11.7 (L) 08/07/2018 05:01 AM    HCT 36.1 (L) 08/07/2018 05:01 AM    PLATELET 195 08/07/2018 05:01 AM    MCV 89.6 08/07/2018 05:01 AM          Condition at discharge: Stable.     Disposition:  Home      PCP:  Wallene Huh, PA-C, f/up in one week.      Total time for DC was 33 minutes.      Marzella Schlein, MD  August 08, 2018  16:01

## 2018-08-08 NOTE — Progress Notes (Signed)
Problem: Falls - Risk of  Goal: *Absence of Falls  Description  Document [name] [name] Fall Risk and appropriate interventions in the flowsheet.  Outcome: Resolved/Met  Note: Fall Risk Interventions:  Mobility Interventions: Bed/chair exit alarm, Communicate number of staff needed for ambulation/transfer         Medication Interventions: Patient to call before getting OOB, Teach patient to arise slowly    Elimination Interventions: Patient to call for help with toileting needs              Problem: Patient Education: Go to Patient Education Activity  Goal: Patient/Family Education  Outcome: Resolved/Met     Problem: Risk for Spread of Infection  Goal: Prevent transmission of infectious organism to others  Description  Prevent the transmission of infectious organisms to other patients, staff members, and visitors.  Outcome: Resolved/Met     Problem: Patient Education:  Go to Education Activity  Goal: Patient/Family Education  Outcome: Resolved/Met     Problem: Artist - Impaired  Goal: *Absence of hypoxia  Outcome: Resolved/Met     Problem: Pressure Injury - Risk of  Goal: *Prevention of pressure injury  Description  Document Braden Scale and appropriate interventions in the flowsheet.  Outcome: Resolved/Met  Note: Pressure Injury Interventions:       Moisture Interventions: Absorbent underpads, Limit adult briefs, Minimize layers    Activity Interventions: Pressure redistribution bed/mattress(bed type)    Mobility Interventions: Pressure redistribution bed/mattress (bed type), HOB 30 degrees or less    Nutrition Interventions: Document food/fluid/supplement intake    Friction and Shear Interventions: Apply protective barrier, creams and emollients, HOB 30 degrees or less, Minimize layers, Transfer aides:transfer board/Hoyer lift/ceiling lift, Transferring/repositioning devices                Problem: Patient Education: Go to Patient Education Activity  Goal: Patient/Family Education  Outcome: Resolved/Met      Problem: Falls - Risk of  Goal: *Absence of Falls  Description  Document [name] [name] Fall Risk and appropriate interventions in the flowsheet.  Outcome: Resolved/Met  Note: Fall Risk Interventions:  Mobility Interventions: Bed/chair exit alarm, Communicate number of staff needed for ambulation/transfer         Medication Interventions: Patient to call before getting OOB, Teach patient to arise slowly    Elimination Interventions: Patient to call for help with toileting needs              Problem: Patient Education: Go to Patient Education Activity  Goal: Patient/Family Education  Outcome: Resolved/Met     Problem: Pressure Injury - Risk of  Goal: *Prevention of pressure injury  Description  Document Braden Scale and appropriate interventions in the flowsheet.  Outcome: Resolved/Met  Note: Pressure Injury Interventions:       Moisture Interventions: Absorbent underpads, Limit adult briefs, Minimize layers    Activity Interventions: Pressure redistribution bed/mattress(bed type)    Mobility Interventions: Pressure redistribution bed/mattress (bed type), HOB 30 degrees or less    Nutrition Interventions: Document food/fluid/supplement intake    Friction and Shear Interventions: Apply protective barrier, creams and emollients, HOB 30 degrees or less, Minimize layers, Transfer aides:transfer board/Hoyer lift/ceiling lift, Transferring/repositioning devices                Problem: Patient Education: Go to Patient Education Activity  Goal: Patient/Family Education  Outcome: Resolved/Met

## 2018-08-08 NOTE — Progress Notes (Signed)
Transport to : HOME  Reason : HOSPITAL D/C  transport set up with : [employer] [facility]  TRANSPORT [phone]  Time/date   08/08/2018 @ 5:30PM  Dc summary loaded N/A  nurse/cm notified   YES  envelope delivered   [employer] verified on face sheet  YES  auth needed    [employer] # [id number]  INSTRUCTED TO ARRIVE AT [facility] AND CALL [phone]

## 2018-08-08 NOTE — Progress Notes (Signed)
Problem: Falls - Risk of  Goal: *Absence of Falls  Description  Document [name] Fall Risk and appropriate interventions in the flowsheet.  Outcome: Resolved/Met  Note: Fall Risk Interventions:  Mobility Interventions: Bed/chair exit alarm, Communicate number of staff needed for ambulation/transfer         Medication Interventions: Patient to call before getting OOB, Teach patient to arise slowly    Elimination Interventions: Patient to call for help with toileting needs              Problem: Patient Education: Go to Patient Education Activity  Goal: Patient/Family Education  Outcome: Resolved/Met     Problem: Risk for Spread of Infection  Goal: Prevent transmission of infectious organism to others  Description  Prevent the transmission of infectious organisms to other patients, staff members, and visitors.  Outcome: Resolved/Met     Problem: Patient Education:  Go to Education Activity  Goal: Patient/Family Education  Outcome: Resolved/Met     Problem: Journalist, newspaper - Impaired  Goal: *Absence of hypoxia  Outcome: Resolved/Met     Problem: Pressure Injury - Risk of  Goal: *Prevention of pressure injury  Description  Document Braden Scale and appropriate interventions in the flowsheet.  Outcome: Resolved/Met  Note: Pressure Injury Interventions:       Moisture Interventions: Absorbent underpads, Limit adult briefs, Minimize layers    Activity Interventions: Pressure redistribution bed/mattress(bed type)    Mobility Interventions: Pressure redistribution bed/mattress (bed type), HOB 30 degrees or less    Nutrition Interventions: Document food/fluid/supplement intake    Friction and Shear Interventions: Apply protective barrier, creams and emollients, HOB 30 degrees or less, Minimize layers, Transfer aides:transfer board/Hoyer lift/ceiling lift, Transferring/repositioning devices                Problem: Patient Education: Go to Patient Education Activity  Goal: Patient/Family Education  Outcome: Resolved/Met

## 2018-08-08 NOTE — Discharge Summary (Signed)
Hospitalist Discharge Summary      Discharge Summary   Admit Date: 08/04/2018  Discharge Date:  08/08/18      Patient ID:  [name] [name]  68 y.o.  December 31, 1950    Chief Complaint   Patient presents with   ??? Headache   ??? Hypertension       Discharge Diagnoses  1.??COPD Exacerbation: Improving  -WBC improved  -We will continue treatment with oral steroids and azithromycin for one more day  -Pt reports she has adequate supplies of home medications available.  ??  2. ??Headache: unspecified type  -Continue despite improved hypertension  -ESR of 35, Negative CRP  -Normal R temporal artery duplex exam  -MRI shows chronic left posterior fossa dural based mass consistent with meningioma which patient reports is chronic, and extensive chronic microvascular ischemic changes but no other acute findings  -At this point will treat supportively and will give prescription for Fioricet at discharge.  ??  3. ??Hypertension:  -Continue home clonidine, diltiazem, losartan,??Aldactone  -Improved from intake.  ??  4. ??Diabetes:  -A1c of 6 earlier this month  -Resume home medications  ??  5. ??Peripheral neuropathy:  -Continue home gabapentin  ??  6. ??Chronic right hip pain  -Does not appear to be on a scheduled medication for this at home  -Spouse reports no longer takes nabumetone  ??  7. ??Marijuana abuse:  -Counseled on cessation given COPD  ??  8. ??PTSD:   -Continue home Seroquel nightly  -Trazodone nightly as needed  ??  9. ??Hyperlipidemia:  -Continue home Crestor  ??  10. Constipation:  -Add bowel regimen.  -Patient reports no recent bowel movement for several days but she also denies severe abdominal pain or feeling of having to pass stool and being unable as well as nausea or vomiting.   -She may use over-the-counter medications as needed to treat for constipation.    Current Discharge Medication List      START taking these medications    Details   azithromycin (ZITHROMAX) 250 mg tablet Take 1 Tab by mouth daily for 1 day. Indications: COPD  exacerbation  Qty: 1 Tab, Refills: 0      butalbital-acetaminophen-caffeine (FIORICET, ESGIC) 50-325-40 mg per tablet Take 1 Tab by mouth every six (6) hours as needed for Headache. Indications: tension headache  Qty: 30 Tab, Refills: 1      predniSONE (DELTASONE) 20 mg tablet Take 40 mg by mouth daily (with breakfast) for 1 day. Indications: copd exacerbation  Qty: 2 Tab, Refills: 0         CONTINUE these medications which have NOT CHANGED    Details   LANTUS SOLOSTAR U-100 INSULIN 100 unit/mL (3 mL) inpn INJECT 32 UNITS SUBCUTANEOUSLY  NIGHTLY  Qty: 15 mL, Refills: 0    Associated Diagnoses: Type 2 diabetes mellitus with diabetic neuropathy, without long-term current use of insulin (HCC)      albuterol (ACCUNEB) 1.25 mg/3 mL nebu USE 1 VIAL IN NEBULIZER EVERY 6 HOURS AS NEEDED FOR  SHORTNESS  OF  BREATH,  WHEEZING  Qty: 270 mL, Refills: 1      losartan (COZAAR) 25 mg tablet Take 0.5 Tabs by mouth daily.  Qty: 30 Tab, Refills: 2      cloNIDine HCl (CATAPRES) 0.1 mg tablet Take 3 Tabs by mouth two (2) times a day.  Qty: 60 Tab, Refills: 1      spironolactone (ALDACTONE) 100 mg tablet TAKE 1 TABLET EVERY DAY  Qty: 90 Tab,  Refills: 0    Associated Diagnoses: Severe hypertension      metFORMIN ER (GLUCOPHAGE XR) 500 mg tablet TAKE 1 TABLET TWICE DAILY  Qty: 180 Tab, Refills: 1      montelukast (SINGULAIR) 10 mg tablet TAKE 1 TABLET EVERY DAY  Qty: 90 Tab, Refills: 3      gabapentin (NEURONTIN) 300 mg capsule Take 1 Cap by mouth three (3) times daily. Max Daily Amount: 900 mg.  Qty: 270 Cap, Refills: 1    Associated Diagnoses: Chronic right hip pain      magnesium oxide (MAG-OX) 400 mg tablet Take 400 mg by mouth daily.      dilTIAZem CD (CARDIZEM CD) 180 mg ER capsule Take 1 Cap by mouth daily.  Qty: 90 Cap, Refills: 1      albuterol-ipratropium (DUO-NEB) 2.5 mg-0.5 mg/3 ml nebu 3 mL by Nebulization route every six (6) hours as needed.      tiotropium bromide (SPIRIVA RESPIMAT) 2.5 mcg/actuation inhaler Take 1 Puff by  inhalation daily.  Qty: 1 Inhaler, Refills: 0    Associated Diagnoses: COPD with acute exacerbation (HCC)      fluticasone propion-salmeterol (ADVAIR DISKUS) 100-50 mcg/dose diskus inhaler Take 1 Puff by inhalation two (2) times a day.  Qty: 1 Inhaler, Refills: 0      rosuvastatin (CRESTOR) 5 mg tablet TAKE 1 TABLET BY MOUTH NIGHTLY  Qty: 90 Tab, Refills: 1    Associated Diagnoses: Type 2 diabetes mellitus with diabetic neuropathy, without long-term current use of insulin (HCC)      polyethylene glycol (MIRALAX) 17 gram packet Take 1 Packet by mouth daily.  Qty: 30 Packet, Refills: 1      QUEtiapine SR (SEROQUEL XR) 150 mg sr tablet Take 150 mg by mouth nightly.      ondansetron (ZOFRAN ODT) 4 mg disintegrating tablet Take 1 Tab by mouth every eight (8) hours as needed for Nausea. Indications: nausea  Qty: 12 Tab, Refills: 0      traZODone (DESYREL) 50 mg tablet Take 1 Tab by mouth nightly as needed for Sleep.  Qty: 20 Tab, Refills: 0               Initial presentation:  From HPI:  [name] is a 68 y.o. 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  She reports headache is her chief complaint and reports this is been intractable, with a pounding sensation originally on the left side but now more on the right side.  She states this typically is worse when her blood pressure is elevated.  Blood pressures were intake were up to the 563O systolic.  However she denies any change in hearing, vision, or any acute numbness or weakness.  Although she reports chronic numbness and tingling at the toes bilaterally, for which she states she takes gabapentin.  She also reports some cough, wheeze, and white sputum production for the last 3 to 4 days  as well.  She reports headache is unchanged after initial treatment in the emergency department including IV Compazine and Benadryl.  No other exacerbating or relieving factors are identified.  She denies any fevers, chills, nausea, vomiting, changes in urinary habits.  She is asked to  be admitted for further evaluation and treatment.    She was made with plans to treat for COPD exacerbation with p.o. azithromycin and oral prednisone, scheduled albuterol nebs, daily Spiriva, Breo for her home Advair, and to continue Singulair.  To monitor CBC, to treat for hypertension and see if headache would improve with this with plans resume her home medications as well as to check an ESR.  Supportive medication with Fioricet, IV Compazine, and PRN Norco and morphine were added if needed.  Otherwise plans made to continue her medications were possible, adding Glucomander for diabetes.    Hospital Course:   Patient's blood pressure and shortness of breath improved fairly quickly.  However headache was persistent, despite improvement in blood pressure.  ESR returned slightly elevated at 35, plans are made check a CRP which was negative, right temporal artery ultrasound also showed no specific findings.  An MRI of the brain was ordered which was delayed until the evening of the 25th.  However this also showed no acute findings, but did show meningioma which patient reports is been present in the past.  At this point her headache feels somewhat better.  We will plan to discharge with Fioricet for as needed treatment for headache.  She does report no recent bowel movement but denies any alarm symptoms.  Otherwise she is plan for discharge today    Imaging:  Xr Chest Pa Lat    Result Date: 08/05/2018  INDICATION: shortness of breath   EXAMINATION: XR CHEST PA LAT COMPARISON: 02/18/2018 FINDINGS: The study shows a normal sized heart.. Stable 2 cm nodule at the left lower lobe. Known bilateral bullae.Marland Kitchen        IMPRESSION: 1. Stable 2 cm  left lower lobe lung nodule. 2. Bullous emphysema.       Temporal Artery Duplex Report  08/07/18  ??  Name: LAKITA, SAHLIN Date: 08/07/2018 09:54 AM  MRN: [id number]                    Patient Location: [id number]  DOB: 26-Aug-1950                Age: 3 yrs  Gender: Female                 Account #: [id number]  Reason For Study: Headaches  Ordering Physician: Marzella Schlein  ??  Performed By: Rito Ehrlich, RVT  ??  Interpretation Summary  Normal velocities were dected in all vessels.  No sign of halo detected around the parietal, frontal and temporal arteries.          MRI BRAIN WO CONT: 08/07/18  ??  CLINICAL INDICATION: intractable HA       ??  COMPARISON:  April 09, 2015  ??  TECHNIQUE: T1 and T2-weighted sequences of the brain were obtained in multiple  planes without contrast.  ??  FINDINGS:   No restricted diffusion. Stable left posterior fossa dural based mass 10 mm,  abutting tentorium. Isointense on T1 and T2. Findings typical of meningioma.  ??  Stable left frontal scalp subgaleal lipoma. Small right frontal subgaleal  lipoma.  No  no midline shift. No hydrocephalus. No extra-axial fluid collections.  Visualized portions of the orbits grossly unremarkable.  Mastoid air cells well aerated. Visualized portions of paranasal sinuses  demonstrate mucosal thickening posterior right ethmoid air cells and right  sphenoid sinus.  ??  Intracranial flow voids of great vessels,  grossly intact. Extensive confluent T2  prolongation in corona radiata and centrum semiovale, more than expected for  patient's age. Extensive subcortical T2 prolongation in the frontoparietal  lobes. T2 prolongation central pons.  Marked frontotemporal lobe atrophy. No susceptibility artifact on gradient echo  imaging to suggest hemorrhage.  Cerebellar tonsil normal.  ??  IMPRESSION  IMPRESSION:  1.   Stable left posterior fossa dural based mass. Long-term stability would be  consistent with meningioma.   2. No acute  infarction.  3. Extensive chronic microvascular ischemic change      Physical Exam on Discharge:  Visit Vitals  BP 124/81 (BP 1 Location: Right arm, BP Patient Position: Supine)   Pulse 85   Temp 98.6 ??F (37 ??C)   Resp 16   Ht '5\' 9"'  (1.753 m)   Wt 85.3 kg (188 lb)   SpO2 100%   BMI 27.76 kg/m??     General: Alert,??Overweight AAF, pleasant to conversation, in no acute distress  CV: Regular rate and rhythm, no murmurs, rubs, gallops  Pulm: Lungs??clear, no wheeze at this time, ??no??crackles, rhonchi  GI: Soft, nontender, nondistended, mildly hypoactive bowel sounds  Extremity: No clubbing, cyanosis, no pitting lower leg edema, capillary refill time intact to distal fingertips  Skin: Warm, dry    Most Recent BMP and CBC:    Lab Results   Component Value Date/Time    Sodium 139 08/06/2018 08:42 AM    Potassium 4.0 08/06/2018 08:42 AM    Chloride 106 08/06/2018 08:42 AM    CO2 29 08/06/2018 08:42 AM    Anion gap 4 (L) 08/06/2018 08:42 AM    Glucose 179 (H) 08/06/2018 08:42 AM    BUN 21 08/06/2018 08:42 AM    Creatinine 1.0 08/06/2018 08:42 AM    BUN/Creatinine ratio 10 (L) 09/20/2017 04:05 PM    GFR est AA >60.0 08/06/2018 08:42 AM    GFR est non-AA 59 08/06/2018 08:42 AM    Calcium 8.8 08/06/2018 08:42 AM      Lab Results   Component Value Date/Time    WBC 11.4 (H) 08/07/2018 05:01 AM    Hemoglobin (POC) 11.8 12/16/2014 02:13 PM    HGB 11.7 (L) 08/07/2018 05:01 AM    HCT 36.1 (L) 08/07/2018 05:01 AM    PLATELET 195 08/07/2018 05:01 AM    MCV 89.6 08/07/2018 05:01 AM          Condition at discharge: Stable.     Disposition:  Home      PCP:  Wallene Huh, PA-C, f/up in one week.      Total time for DC was 33 minutes.      Marzella Schlein, MD  August 08, 2018  16:01

## 2018-08-08 NOTE — Progress Notes (Signed)
Discharge Date: 08/08/2018    Discharge Location: Home    Discharge Needs: [employer]     Transportation: [employer]     Communication with: Patient, RN, MD

## 2018-08-09 NOTE — Progress Notes (Signed)
Hospital Discharge Follow-Up      Date/Time:  08/09/2018 1:08 PM    Patient was admitted to [facility] on 08/04/18 and discharged on 08/08/18 for COPD exacerbation. The physician discharge summary was available at the time of outreach.  Patient was contacted within 1 business days of discharge.      Care Transition Nurse (CTN) contacted the patient by telephone to perform post hospital discharge assessment. Provided introduction to self, and explanation of the CTN role. Was informed this is not a good time. Will attempt to reach at a later time.

## 2018-08-09 NOTE — Progress Notes (Signed)
Hospital Discharge Follow-Up      Date/Time:  08/09/2018 1:08 PM    Patient was admitted to [facility] on 08/04/18 and discharged on 08/08/18 for COPD exacerbation. The physician discharge summary was available at the time of outreach.  Patient was contacted within 1 business days of discharge.      Care Transition Nurse (CTN) contacted the patient by telephone to perform post hospital discharge assessment. Provided introduction to self, and explanation of the CTN role. Was informed this is not a good time. Will attempt to reach at a later time.

## 2018-08-10 NOTE — Progress Notes (Signed)
Hospital Discharge Follow-Up      Date/Time:  08/10/2018 9:19 AM    Patient was admitted to [facility] on 08/04/18 and discharged on 08/08/18 for COPD exacerbation. The physician discharge summary was available at the time of outreach.  Patient was contacted within 1 business days of discharge.    Top Challenges reviewed with the provider   Advance Care Planning:   Does patient have an Advance Directive:  reviewed and current          Was this a readmission? no   Patient stated reason for the readmission: N/A    Care Transition Nurse (CTN) contacted the patient by telephone to perform post hospital discharge assessment. Verified name and DOB with patient as identifiers. Provided introduction to self, and explanation of the CTN role.     Patient received hospital discharge instructions.  CTN reviewed discharge instructions and red flags with patient who verbalized understanding. Patient given an opportunity to ask questions and does not have any further questions or concerns at this time. The patient agrees to contact the PCP office for questions related to their healthcare. CTN provided contact information for future reference.    Patients top risk factors for readmission:  None at this time     Home Health orders at discharge: none  Home Health company: None   Date of initial visit: None    Durable Medical Equipment ordered at discharge: none  Durable Medical Equipment Company: None   Durable Medical Equipment received: None    Medication(s):   New Medications at Discharge: Zithromax, Fioricet, Deltasone  Changed Medications at Discharge: None  Discontinued Medications at Discharge: None    Medication reconciliation was performed with patient, who verbalizes understanding of administration of home medications.  There were no barriers to obtaining medications identified at this time.    Referral to Pharm D needed: no     Current Outpatient Medications   Medication Sig    ??? azithromycin (ZITHROMAX) 250 mg tablet Take 1 Tab by mouth daily for 1 day. Indications: COPD exacerbation   ??? butalbital-acetaminophen-caffeine (FIORICET, ESGIC) 50-325-40 mg per tablet Take 1 Tab by mouth every six (6) hours as needed for Headache. Indications: tension headache   ??? predniSONE (DELTASONE) 20 mg tablet Take 40 mg by mouth daily (with breakfast) for 1 day. Indications: copd exacerbation   ??? LANTUS SOLOSTAR U-100 INSULIN 100 unit/mL (3 mL) inpn INJECT 32 UNITS SUBCUTANEOUSLY  NIGHTLY   ??? albuterol (ACCUNEB) 1.25 mg/3 mL nebu USE 1 VIAL IN NEBULIZER EVERY 6 HOURS AS NEEDED FOR  SHORTNESS  OF  BREATH,  WHEEZING   ??? losartan (COZAAR) 25 mg tablet Take 0.5 Tabs by mouth daily.   ??? cloNIDine HCl (CATAPRES) 0.1 mg tablet Take 3 Tabs by mouth two (2) times a day.   ??? spironolactone (ALDACTONE) 100 mg tablet TAKE 1 TABLET EVERY DAY   ??? metFORMIN ER (GLUCOPHAGE XR) 500 mg tablet TAKE 1 TABLET TWICE DAILY   ??? montelukast (SINGULAIR) 10 mg tablet TAKE 1 TABLET EVERY DAY   ??? gabapentin (NEURONTIN) 300 mg capsule Take 1 Cap by mouth three (3) times daily. Max Daily Amount: 900 mg.   ??? polyethylene glycol (MIRALAX) 17 gram packet Take 1 Packet by mouth daily. (Patient taking differently: Take 17 g by mouth daily as needed for Constipation.)   ??? magnesium oxide (MAG-OX) 400 mg tablet Take 400 mg by mouth daily.   ??? QUEtiapine SR (SEROQUEL XR) 150 mg sr tablet Take  150 mg by mouth nightly as needed.   ??? dilTIAZem CD (CARDIZEM CD) 180 mg ER capsule Take 1 Cap by mouth daily.   ??? ondansetron (ZOFRAN ODT) 4 mg disintegrating tablet Take 1 Tab by mouth every eight (8) hours as needed for Nausea. Indications: nausea   ??? tiotropium bromide (SPIRIVA RESPIMAT) 2.5 mcg/actuation inhaler Take 1 Puff by inhalation daily.   ??? fluticasone propion-salmeterol (ADVAIR DISKUS) 100-50 mcg/dose diskus inhaler Take 1 Puff by inhalation two (2) times a day.   ??? traZODone (DESYREL) 50 mg tablet Take 1 Tab by mouth nightly as needed  for Sleep.   ??? rosuvastatin (CRESTOR) 5 mg tablet TAKE 1 TABLET BY MOUTH NIGHTLY     No current facility-administered medications for this visit.        Medications Discontinued During This Encounter   Medication Reason   ??? albuterol-ipratropium (DUO-NEB) 2.5 mg-0.5 mg/3 ml nebu Not A Current Medication       [facility] follow up appointment(s):   Future Appointments   Date Time Provider Department Center   08/15/2018  2:30 PM [id number]      Non-[facility] follow up appointment(s): CT of Chest on 08/15/18     Goals Addressed                 This Visit's Progress    ??? Attends follow-up appointments as directed.        Ensure provider appt is scheduled within 7 days post-discharge; 2/28: Patient voiced her intent to schedule follow up appt with PCP. Aware of CT of chest   Confirm patient attended post-discharge provider appt   Complete post-visit call to confirm attendance and update care needs            ??? Prevent complications post hospitalization.        Plan of Care:   Review HUG and document priority self-management skills  Ensure provider appt is scheduled within 7 days post-discharge   Confirm patient attended post-discharge provider appt   Complete post-visit call to confirm attendance and update care needs       Review/educate common or potential "red flags" of condition worsening; 2/28: Patient to report increase or worsening of cough, chest tightness, increased chest congestion, SOB   Assess for health risk behaviors and educate patient/caregivers on reducing risk; 2/28: Advised to refrain from smoking marijuana and/or cigarettes and to avoid others that do so as this will lead to COPD exacerbation, covering mouth when coughing or coughing into crease of are, have friends/family to stay at home or wear masks if they are sick and educated on the importance fo good hand hygiene

## 2018-08-10 NOTE — Progress Notes (Signed)
 Hospital Discharge Follow-Up      Date/Time:  08/10/2018 9:19 AM    Patient was admitted to [facility] on 08/04/18 and discharged on 08/08/18 for COPD exacerbation. The physician discharge summary was available at the time of outreach.  Patient was contacted within 1 business days of discharge.    Top Challenges reviewed with the provider   Advance Care Planning:   Does patient have an Advance Directive:  reviewed and current          Was this a readmission? no   Patient stated reason for the readmission: N/A    Care Transition Nurse (CTN) contacted the patient by telephone to perform post hospital discharge assessment. Verified name and DOB with patient as identifiers. Provided introduction to self, and explanation of the CTN role.     Patient received hospital discharge instructions.  CTN reviewed discharge instructions and red flags with patient who verbalized understanding. Patient given an opportunity to ask questions and does not have any further questions or concerns at this time. The patient agrees to contact the PCP office for questions related to their healthcare. CTN provided contact information for future reference.    Patients top risk factors for readmission:  None at this time     Home Health orders at discharge: none  Home Health company: None   Date of initial visit: None    Durable Medical Equipment ordered at discharge: none  Durable Medical Equipment Company: None   Durable Medical Equipment received: None    Medication(s):   New Medications at Discharge: Zithromax, Fioricet, Deltasone  Changed Medications at Discharge: None  Discontinued Medications at Discharge: None    Medication reconciliation was performed with patient, who verbalizes understanding of administration of home medications.  There were no barriers to obtaining medications identified at this time.    Referral to Pharm D needed: no     Current Outpatient Medications   Medication Sig   . azithromycin  (ZITHROMAX) 250 mg tablet Take 1 Tab by mouth daily for 1 day. Indications: COPD exacerbation   . butalbital-acetaminophen -caffeine (FIORICET, ESGIC) 50-325-40 mg per tablet Take 1 Tab by mouth every six (6) hours as needed for Headache. Indications: tension headache   . predniSONE (DELTASONE) 20 mg tablet Take 40 mg by mouth daily (with breakfast) for 1 day. Indications: copd exacerbation   . LANTUS  SOLOSTAR U-100 INSULIN  100 unit/mL (3 mL) inpn INJECT 32 UNITS SUBCUTANEOUSLY  NIGHTLY   . albuterol  (ACCUNEB ) 1.25 mg/3 mL nebu USE 1 VIAL IN NEBULIZER EVERY 6 HOURS AS NEEDED FOR  SHORTNESS  OF  BREATH,  WHEEZING   . losartan  (COZAAR ) 25 mg tablet Take 0.5 Tabs by mouth daily.   . cloNIDine HCl (CATAPRES) 0.1 mg tablet Take 3 Tabs by mouth two (2) times a day.   . spironolactone  (ALDACTONE ) 100 mg tablet TAKE 1 TABLET EVERY DAY   . metFORMIN ER (GLUCOPHAGE XR) 500 mg tablet TAKE 1 TABLET TWICE DAILY   . montelukast  (SINGULAIR ) 10 mg tablet TAKE 1 TABLET EVERY DAY   . gabapentin (NEURONTIN) 300 mg capsule Take 1 Cap by mouth three (3) times daily. Max Daily Amount: 900 mg.   . polyethylene glycol (MIRALAX ) 17 gram packet Take 1 Packet by mouth daily. (Patient taking differently: Take 17 g by mouth daily as needed for Constipation.)   . magnesium  oxide (MAG-OX) 400 mg tablet Take 400 mg by mouth daily.   . QUEtiapine  SR (SEROQUEL  XR) 150 mg sr tablet Take  150 mg by mouth nightly as needed.   . dilTIAZem CD (CARDIZEM CD) 180 mg ER capsule Take 1 Cap by mouth daily.   . ondansetron  (ZOFRAN  ODT) 4 mg disintegrating tablet Take 1 Tab by mouth every eight (8) hours as needed for Nausea. Indications: nausea   . tiotropium bromide (SPIRIVA RESPIMAT) 2.5 mcg/actuation inhaler Take 1 Puff by inhalation daily.   . fluticasone  propion-salmeterol (ADVAIR DISKUS) 100-50 mcg/dose diskus inhaler Take 1 Puff by inhalation two (2) times a day.   . traZODone (DESYREL) 50 mg tablet Take 1 Tab by mouth nightly as needed for Sleep.   .  rosuvastatin  (CRESTOR ) 5 mg tablet TAKE 1 TABLET BY MOUTH NIGHTLY     No current facility-administered medications for this visit.        Medications Discontinued During This Encounter   Medication Reason   . albuterol -ipratropium (DUO-NEB) 2.5 mg-0.5 mg/3 ml nebu Not A Current Medication       [facility] follow up appointment(s):   Future Appointments   Date Time Provider Department Center   08/15/2018  2:30 PM [id number]      Non-[facility] follow up appointment(s): CT of Chest on 08/15/18     Goals Addressed                 This Visit's Progress    . Attends follow-up appointments as directed.        Ensure provider appt is scheduled within 7 days post-discharge; 2/28: Patient voiced her intent to schedule follow up appt with PCP. Aware of CT of chest   Confirm patient attended post-discharge provider appt   Complete post-visit call to confirm attendance and update care needs            . Prevent complications post hospitalization.        Plan of Care:   Review HUG and document priority self-management skills  Ensure provider appt is scheduled within 7 days post-discharge   Confirm patient attended post-discharge provider appt   Complete post-visit call to confirm attendance and update care needs       Review/educate common or potential red flags of condition worsening; 2/28: Patient to report increase or worsening of cough, chest tightness, increased chest congestion, SOB   Assess for health risk behaviors and educate patient/caregivers on reducing risk; 2/28: Advised to refrain from smoking marijuana and/or cigarettes and to avoid others that do so as this will lead to COPD exacerbation, covering mouth when coughing or coughing into crease of are, have friends/family to stay at home or wear masks if they are sick and educated on the importance fo good hand hygiene

## 2018-08-11 LAB — EKG 12-LEAD
Atrial Rate: 90 {beats}/min
P Axis: 67 degrees
P-R Interval: 156 ms
Q-T Interval: 362 ms
QRS Duration: 74 ms
QTc Calculation (Bazett): 442 ms
R Axis: 38 degrees
T Axis: 20 degrees
Ventricular Rate: 90 {beats}/min

## 2018-08-11 LAB — EKG, 12 LEAD, INITIAL
Atrial Rate: 90 {beats}/min
Calculated P Axis: 67 degrees
Calculated R Axis: 38 degrees
Calculated T Axis: 20 degrees
P-R Interval: 156 ms
Q-T Interval: 362 ms
QRS Duration: 74 ms
QTC Calculation (Bezet): 442 ms
Ventricular Rate: 90 {beats}/min

## 2018-08-15 ENCOUNTER — Inpatient Hospital Stay: Payer: MEDICARE | Attending: Specialist | Primary: Physician Assistant

## 2018-08-22 ENCOUNTER — Inpatient Hospital Stay
Admit: 2018-08-22 | Discharge: 2018-09-07 | Disposition: A | Discharge status: Home / Self Care | Payer: MEDICARE | Attending: Internal Medicine | Admitting: Internal Medicine

## 2018-08-22 ENCOUNTER — Emergency Department: Admit: 2018-08-22 | Payer: MEDICARE | Primary: Physician Assistant

## 2018-08-22 DIAGNOSIS — J9601 Acute respiratory failure with hypoxia: Principal | ICD-10-CM

## 2018-08-22 LAB — CBC WITH AUTO DIFFERENTIAL
Basophils %: 0.3 % (ref 0–3)
Eosinophils %: 0.4 % (ref 0–5)
Hematocrit: 39 % (ref 37.0–50.0)
Hemoglobin: 12.7 gm/dl — ABNORMAL LOW (ref 13.0–17.2)
Immature Granulocytes: 0.3 % (ref 0.0–3.0)
Lymphocytes %: 6.1 % — ABNORMAL LOW (ref 28–48)
MCH: 28.7 pg (ref 25.4–34.6)
MCHC: 32.6 gm/dl (ref 30.0–36.0)
MCV: 88.2 fL (ref 80.0–98.0)
MPV: 11.3 fL — ABNORMAL HIGH (ref 6.0–10.0)
Monocytes %: 6.3 % (ref 1–13)
Neutrophils %: 86.6 % — ABNORMAL HIGH (ref 34–64)
Nucleated RBCs: 0 (ref 0–0)
Platelets: 200 10*3/uL (ref 140–450)
RBC: 4.42 M/uL (ref 3.60–5.20)
RDW-SD: 44.4 (ref 36.4–46.3)
WBC: 12.3 10*3/uL — ABNORMAL HIGH (ref 4.0–11.0)

## 2018-08-22 LAB — POC BLOOD GAS + LACTIC ACID
BASE EXCESS: 3 mmol/L (ref <=3)
BICARBONATE: 27.4 mmol/L — ABNORMAL HIGH (ref 18.0–26.0)
Base Excess: 3 mmol/L (ref <=3)
CO2 Total: 29 mmol/L (ref 24–29)
CO2, TOTAL: 29 mmol/L (ref 24–29)
EPAP/CPAP/PEEP: 8
EPAP/CPAP/PEEP: 8
FIO2: 30
FIO2: 30
HCO3: 27.4 mmol/L — ABNORMAL HIGH (ref 18.0–26.0)
IPAP/PIP: 15
IPAP/PIP: 15
O2 SAT: 96 % (ref 90–100)
O2 Sat: 96 % (ref 90–100)
PCO2: 44.1 mm Hg (ref 35.0–45.0)
PCO2: 44.1 mm Hg (ref 35.0–45.0)
PO2: 82 mm Hg (ref 75–100)
PO2: 82 mm Hg (ref 75–100)
Patient temp.: 98.6
Patient temperature: 98.6
RESPIRATORY RATE: 24
Respiratory Rate: 24
SET RATE: 8
Set Rate: 8
VTEXP, VTEXP: 556
VTEXP: 556
pH: 7.4 (ref 7.350–7.450)
pH: 7.4 (ref 7.350–7.450)

## 2018-08-22 LAB — COMPREHENSIVE METABOLIC PANEL
ALT: 15 U/L (ref 12–78)
AST: 7 U/L — ABNORMAL LOW (ref 15–37)
Albumin: 3.3 gm/dl — ABNORMAL LOW (ref 3.4–5.0)
Alkaline Phosphatase: 98 U/L (ref 45–117)
Anion Gap: 7 mmol/L (ref 5–15)
BUN: 19 mg/dl (ref 7–25)
CO2: 29 mEq/L (ref 21–32)
Calcium: 9.2 mg/dl (ref 8.5–10.1)
Chloride: 103 mEq/L (ref 98–107)
Creatinine: 1 mg/dl (ref 0.6–1.3)
EGFR IF NonAfrican American: 59
GFR African American: >60
Glucose: 95 mg/dl (ref 74–106)
Potassium: 3.9 mEq/L (ref 3.5–5.1)
Sodium: 139 mEq/L (ref 136–145)
Total Bilirubin: 0.2 mg/dl (ref 0.2–1.0)
Total Protein: 7.7 gm/dl (ref 6.4–8.2)

## 2018-08-22 LAB — PROBNP, N-TERMINAL: BNP: 18 pg/ml (ref 0.0–125.0)

## 2018-08-22 LAB — INFLUENZA A/B, BY PCR
Influenza A PCR: NEGATIVE
Influenza A PCR: NEGATIVE
Influenza B PCR: NEGATIVE
Influenza B PCR: NEGATIVE

## 2018-08-22 LAB — TROPONIN
Troponin I: <0.015 ng/ml (ref 0.000–0.045)
Troponin I: <0.015 ng/ml (ref 0.000–0.045)

## 2018-08-22 LAB — CBC WITH AUTOMATED DIFF
BASOPHILS: 0.3 % (ref 0–3)
EOSINOPHILS: 0.4 % (ref 0–5)
HCT: 39 % (ref 37.0–50.0)
HGB: 12.7 gm/dl — ABNORMAL LOW (ref 13.0–17.2)
IMMATURE GRANULOCYTES: 0.3 % (ref 0.0–3.0)
LYMPHOCYTES: 6.1 % — ABNORMAL LOW (ref 28–48)
MCH: 28.7 pg (ref 25.4–34.6)
MCHC: 32.6 gm/dl (ref 30.0–36.0)
MCV: 88.2 fL (ref 80.0–98.0)
MONOCYTES: 6.3 % (ref 1–13)
MPV: 11.3 fL — ABNORMAL HIGH (ref 6.0–10.0)
NEUTROPHILS: 86.6 % — ABNORMAL HIGH (ref 34–64)
NRBC: 0 (ref 0–0)
PLATELET: 200 10*3/uL (ref 140–450)
RBC: 4.42 M/uL (ref 3.60–5.20)
RDW-SD: 44.4 (ref 36.4–46.3)
WBC: 12.3 10*3/uL — ABNORMAL HIGH (ref 4.0–11.0)

## 2018-08-22 LAB — TROPONIN I
Troponin-I: <0.015 ng/ml (ref 0.000–0.045)
Troponin-I: <0.015 ng/ml (ref 0.000–0.045)

## 2018-08-22 LAB — METABOLIC PANEL, COMPREHENSIVE
ALT (SGPT): 15 U/L (ref 12–78)
AST (SGOT): 7 U/L — ABNORMAL LOW (ref 15–37)
Albumin: 3.3 gm/dl — ABNORMAL LOW (ref 3.4–5.0)
Alk. phosphatase: 98 U/L (ref 45–117)
Anion gap: 7 mmol/L (ref 5–15)
BUN: 19 mg/dl (ref 7–25)
Bilirubin, total: 0.2 mg/dl (ref 0.2–1.0)
CO2: 29 mEq/L (ref 21–32)
Calcium: 9.2 mg/dl (ref 8.5–10.1)
Chloride: 103 mEq/L (ref 98–107)
Creatinine: 1 mg/dl (ref 0.6–1.3)
GFR est AA: >60
GFR est non-AA: 59
Glucose: 95 mg/dl (ref 74–106)
Potassium: 3.9 mEq/L (ref 3.5–5.1)
Protein, total: 7.7 gm/dl (ref 6.4–8.2)
Sodium: 139 mEq/L (ref 136–145)

## 2018-08-22 LAB — NT-PRO BNP: NT pro-BNP: 18 pg/ml (ref 0.0–125.0)

## 2018-08-22 MED ORDER — ELECTROLYTE REPLACEMENT PROTOCOL
Status: DC | PRN
Start: 2018-08-22 — End: 2018-08-24

## 2018-08-22 MED ORDER — ACETAMINOPHEN 1,000 MG/100 ML (10 MG/ML) IV
1000 mg/100 mL (10 mg/mL) | Freq: Once | INTRAVENOUS | Status: AC
Start: 2018-08-22 — End: 2018-08-22
  Administered 2018-08-22: 19:00:00 via INTRAVENOUS

## 2018-08-22 MED ORDER — SODIUM CHLORIDE 0.9 % IV
500 mg | INTRAVENOUS | Status: DC
Start: 2018-08-22 — End: 2018-08-24
  Administered 2018-08-22 – 2018-08-23 (×2): via INTRAVENOUS

## 2018-08-22 MED ORDER — SODIUM CHLORIDE 0.9 % IV
INTRAVENOUS | Status: AC
Start: 2018-08-22 — End: 2018-08-23
  Administered 2018-08-22: 22:00:00 via INTRAVENOUS

## 2018-08-22 MED ORDER — ARFORMOTEROL 15 MCG/2 ML NEB SOLUTION
15 mcg/2 mL | Freq: Two times a day (BID) | RESPIRATORY_TRACT | Status: DC
Start: 2018-08-22 — End: 2018-09-07
  Administered 2018-08-23 – 2018-09-07 (×31): via RESPIRATORY_TRACT

## 2018-08-22 MED ORDER — SODIUM CHLORIDE 0.9% BOLUS IV
0.9 % | INTRAVENOUS | Status: AC
Start: 2018-08-22 — End: 2018-08-22
  Administered 2018-08-22: 19:00:00 via INTRAVENOUS

## 2018-08-22 MED ORDER — GABAPENTIN 300 MG CAP
300 mg | Freq: Three times a day (TID) | ORAL | Status: DC
Start: 2018-08-22 — End: 2018-09-07
  Administered 2018-08-22 – 2018-09-07 (×48): via ORAL

## 2018-08-22 MED ORDER — MONTELUKAST 10 MG TAB
10 mg | Freq: Every evening | ORAL | Status: DC
Start: 2018-08-22 — End: 2018-09-07
  Administered 2018-08-23 – 2018-09-07 (×16): via ORAL

## 2018-08-22 MED ORDER — ACETAMINOPHEN 325 MG TABLET
325 mg | ORAL | Status: DC | PRN
Start: 2018-08-22 — End: 2018-09-07
  Administered 2018-08-23 – 2018-08-28 (×3): via ORAL

## 2018-08-22 MED ORDER — HYDROCODONE-ACETAMINOPHEN 5 MG-325 MG TAB
5-325 mg | ORAL | Status: DC | PRN
Start: 2018-08-22 — End: 2018-09-03
  Administered 2018-08-23 – 2018-08-27 (×16): via ORAL

## 2018-08-22 MED ORDER — FLUTICASONE 100 MCG-VILANTEROL 25 MCG/DOSE BREATH ACTIVATED INHALER
100-25 mcg/dose | Freq: Every day | RESPIRATORY_TRACT | Status: DC
Start: 2018-08-22 — End: 2018-08-22

## 2018-08-22 MED ORDER — CLONIDINE 0.2 MG TAB
0.2 mg | Freq: Two times a day (BID) | ORAL | Status: DC
Start: 2018-08-22 — End: 2018-08-24

## 2018-08-22 MED ORDER — HEPARIN (PORCINE) 5,000 UNIT/ML IJ SOLN
5000 unit/mL | Freq: Three times a day (TID) | INTRAMUSCULAR | Status: DC
Start: 2018-08-22 — End: 2018-09-07
  Administered 2018-08-22 – 2018-09-07 (×39): via SUBCUTANEOUS

## 2018-08-22 MED ORDER — ALBUTEROL SULFATE 0.083 % (0.83 MG/ML) SOLN FOR INHALATION
2.5 mg /3 mL (0.083 %) | RESPIRATORY_TRACT | Status: DC | PRN
Start: 2018-08-22 — End: 2018-08-29
  Administered 2018-08-22 – 2018-08-24 (×2): via RESPIRATORY_TRACT

## 2018-08-22 MED ORDER — METHYLPREDNISOLONE (PF) 125 MG/2 ML IJ SOLR
125 mg/2 mL | Freq: Once | INTRAMUSCULAR | Status: AC
Start: 2018-08-22 — End: 2018-08-22
  Administered 2018-08-22: 19:00:00 via INTRAVENOUS

## 2018-08-22 MED ORDER — ALBUTEROL SULFATE 0.083 % (0.83 MG/ML) SOLN FOR INHALATION
2.5 mg /3 mL (0.083 %) | RESPIRATORY_TRACT | Status: AC
Start: 2018-08-22 — End: 2018-08-22
  Administered 2018-08-22 (×2): via RESPIRATORY_TRACT

## 2018-08-22 MED ORDER — ALBUTEROL SULFATE 0.083 % (0.83 MG/ML) SOLN FOR INHALATION
2.5 mg /3 mL (0.083 %) | RESPIRATORY_TRACT | Status: AC
Start: 2018-08-22 — End: 2018-08-22
  Administered 2018-08-22: 22:00:00 via RESPIRATORY_TRACT

## 2018-08-22 MED ORDER — FLUTICASONE-SALMETEROL 100 MCG-50 MCG/DOSE DISK DEVICE FOR INHALATION
100-50 mcg/dose | Freq: Two times a day (BID) | RESPIRATORY_TRACT | Status: DC
Start: 2018-08-22 — End: 2018-08-22

## 2018-08-22 MED ORDER — BUDESONIDE 0.5 MG/2 ML NEB SUSPENSION
0.5 mg/2 mL | Freq: Two times a day (BID) | RESPIRATORY_TRACT | Status: DC
Start: 2018-08-22 — End: 2018-09-07
  Administered 2018-08-23 – 2018-09-07 (×32): via RESPIRATORY_TRACT

## 2018-08-22 MED ORDER — METHYLPREDNISOLONE (PF) 40 MG/ML IJ SOLR
40 mg/mL | Freq: Four times a day (QID) | INTRAMUSCULAR | Status: DC
Start: 2018-08-22 — End: 2018-08-24
  Administered 2018-08-22 – 2018-08-24 (×7): via INTRAVENOUS

## 2018-08-22 MED ORDER — LOSARTAN 25 MG TAB
25 mg | Freq: Every day | ORAL | Status: DC
Start: 2018-08-22 — End: 2018-08-29
  Administered 2018-08-24 – 2018-08-29 (×7): via ORAL

## 2018-08-22 MED ORDER — DILTIAZEM ER 180 MG 24 HR CAP
180 mg | Freq: Every day | ORAL | Status: DC
Start: 2018-08-22 — End: 2018-08-29
  Administered 2018-08-23 – 2018-08-29 (×8): via ORAL

## 2018-08-22 MED ORDER — ACETAMINOPHEN 650 MG RECTAL SUPPOSITORY
650 mg | RECTAL | Status: DC | PRN
Start: 2018-08-22 — End: 2018-09-05

## 2018-08-22 MED ORDER — REVEFENACIN 175 MCG/3 ML SOLUTION FOR NEBULIZATION
175 mcg/3 mL | Freq: Every day | RESPIRATORY_TRACT | Status: DC
Start: 2018-08-22 — End: 2018-08-23
  Administered 2018-08-23 (×2): via RESPIRATORY_TRACT

## 2018-08-22 MED ORDER — ACETAMINOPHEN (TYLENOL) SOLUTION 32MG/ML
ORAL | Status: DC | PRN
Start: 2018-08-22 — End: 2018-09-05

## 2018-08-22 MED ORDER — NALOXONE 0.4 MG/ML INJECTION
0.4 mg/mL | INTRAMUSCULAR | Status: DC | PRN
Start: 2018-08-22 — End: 2018-09-07

## 2018-08-22 MED ORDER — ONDANSETRON (PF) 4 MG/2 ML INJECTION
4 mg/2 mL | INTRAMUSCULAR | Status: DC | PRN
Start: 2018-08-22 — End: 2018-09-07

## 2018-08-22 MED FILL — BROVANA 15 MCG/2 ML SOLUTION FOR NEBULIZATION: 15 mcg/2 mL | RESPIRATORY_TRACT | Qty: 1

## 2018-08-22 MED FILL — HEPARIN (PORCINE) 5,000 UNIT/ML IJ SOLN: 5000 unit/mL | INTRAMUSCULAR | Qty: 1

## 2018-08-22 MED FILL — ELECTROLYTE REPLACEMENT PROTOCOL: Qty: 1

## 2018-08-22 MED FILL — ALBUTEROL SULFATE 0.083 % (0.83 MG/ML) SOLN FOR INHALATION: 2.5 mg /3 mL (0.083 %) | RESPIRATORY_TRACT | Qty: 1

## 2018-08-22 MED FILL — OFIRMEV 1,000 MG/100 ML (10 MG/ML) INTRAVENOUS SOLUTION: 1000 mg/100 mL (10 mg/mL) | INTRAVENOUS | Qty: 100

## 2018-08-22 MED FILL — AZITHROMYCIN 500 MG IV SOLUTION: 500 mg | INTRAVENOUS | Qty: 5

## 2018-08-22 MED FILL — ALBUTEROL SULFATE 0.083 % (0.83 MG/ML) SOLN FOR INHALATION: 2.5 mg /3 mL (0.083 %) | RESPIRATORY_TRACT | Qty: 2

## 2018-08-22 MED FILL — SOLU-MEDROL (PF) 40 MG/ML SOLUTION FOR INJECTION: 40 mg/mL | INTRAMUSCULAR | Qty: 1

## 2018-08-22 MED FILL — GABAPENTIN 300 MG CAP: 300 mg | ORAL | Qty: 1

## 2018-08-22 MED FILL — BREO ELLIPTA 100 MCG-25 MCG/DOSE POWDER FOR INHALATION: 100-25 mcg/dose | RESPIRATORY_TRACT | Qty: 28

## 2018-08-22 MED FILL — SOLU-MEDROL (PF) 125 MG/2 ML SOLUTION FOR INJECTION: 125 mg/2 mL | INTRAMUSCULAR | Qty: 2

## 2018-08-22 MED FILL — SODIUM CHLORIDE 0.9 % IV: INTRAVENOUS | Qty: 1000

## 2018-08-22 NOTE — Other (Signed)
Pt requesting seroqel. Pt states "I take it every night". Paged md to request medication for pt.

## 2018-08-22 NOTE — Progress Notes (Signed)
prelim code r note  ??  Last admission date 2/23-2/26    reason for last admission COPD AE    MD camarato  ??  disposition at discharge home  ??  Insurance [employer]  ??

## 2018-08-22 NOTE — H&P (Signed)
Hospitalist Admission History and Physical    NAME:  [name] [name]   DOB:   Oct 21, 1950   MRN:   [id number]     PCP:  Wallene Huh, PA-C  Admission Date/Time:  08/22/2018 3:33 PM  Anticipated Date of Discharge: 08/25/2018  Anticipated Disposition (home, SNF) : [facility] ([place])    Patient continues to be in respiratory distress, on BiPAP and is hypoxic, does not get better will need to be moved to ICU, with her previous history of intubation is at high risk for respiratory arrest needing intubation    Assessment/Plan:      Active Problems:    * No active hospital problems. *     Acute hypoxic respiratory failure on BiPAP  Acute COPD exacerbation  Hypertension  Type 2 diabetes mellitus  Peripheral neuropathy  PTSD  2 cm left lower lobe nodule  Hyperlipidemia  ___________________________________________________  PLAN:    Admit to stepdown ICU  Continue DuoNeb, IV steroid  Gentle hydration with IV fluids  We will hold blood pressure medication considering blood pressures on the lower side  Glucomander monitoring  Continue Singulair, Cardizem, trazodone, Spiriva from tomorrow if patient gets better  Check sputum culture  Electrolyte replacement per stepdown ICU protocol    Risk of deterioration:  _0 Low    _1 Moderate  _2 High    Prophylaxis:  _3 Lovenox  _4 Coumadin  _5 Hep SQ  _6 SCD???s  _7 H2B/PPI    Disposition:  _8 Home w/ Family   _9 HH PT,OT,RN   _10 SNF/LTC   _11 SAH/Rehab       Full code as discussed with patient and patient's power of attorney husband [place]. Phone number for the power of attorney and the medical decision maker is.  [phone]. Discussed nature of interventions like cardiopulmonary resuscitation/ventilatory support and patient's power of attorney clearly voices that she  would want all life prolonging measures or aggressive interventions as needed to be alive. Time spent exclusively in advance care planning is 17 minutes      Subjective:   CHIEF COMPLAINT:    Chief Complaint   Patient presents with   ??? Respiratory Distress        HISTORY OF PRESENT ILLNESS:     [name] [name] is a 68 y.o. BLACK OR [place] female who presents with shortness of breath which has been progressively getting worse over the past 2 days  Patient has history of COPD, has been using her home nebulizer, did not get better and called paramedics who gave her Combivent and albuterol in the road  She did start to get a little bit better, remained in respiratory distress and was extremely hypoxic when she arrived to the emergency room  Patient has been intubated in the past got extremely stressed out about needing to get intubated again  She has been coughing up white phlegm, no chest pain, lower extremity edema or PND  She is followed by Dr. Barnett Hatter from pulmonology and is supposed to see him on Friday    Past Medical History:   Diagnosis Date   ??? Arthritis    ??? 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 DM2 (diabetes mellitus, type 2) ([place])    ??? GERD (gastroesophageal reflux disease)    ??? Hepatitis C     HEP C   ??? HTN (hypertension)    ??? Lung nodule, multiple     (  CT 10/2016) Small left upper lobe and 1.7 x 1.6 cm left lower lobe nodules not significantly changed from 07/23/2016 and 03/22/2016   ??? Marijuana use    ??? PTSD (post-traumatic stress disorder)     lived through the [place] in 1993   ??? Thoracic ascending aortic aneurysm ([place])     4.4cm noted on CT Chest (10/2016)   ??? Thyroid nodule     2.5 cm stable right thyroid nodule (CT 10/2016)        Past Surgical History:   Procedure Laterality Date   ??? HX ENDOSCOPY     ??? HX HERNIA REPAIR  2019    x2   ??? HX HIP REPLACEMENT Right 01/29/2015   ??? HX HIP REPLACEMENT     ??? HX HYSTERECTOMY  2010    hysterectomy       Social History     Tobacco Use   ??? Smoking status: Former Smoker   ??? Smokeless tobacco: Never Used   ??? Tobacco comment: reported as quit smoking around 1990s per spouse    Substance Use Topics   ??? Alcohol use: No        Family History   Problem Relation Age of Onset   ??? Hypertension Mother    ??? Other Mother         OSA   ??? Cancer Father         suspected leukemia per pt's spouse   ??? Cancer Maternal Aunt    ??? Alcohol abuse Maternal Grandfather         Allergies   Allergen Reactions   ??? Chocolate [Cocoa] Sneezing     Confirms allergy but reports "I still eat it"        Prior to Admission Medications   Prescriptions Last Dose Informant Patient Reported? Taking?   LANTUS SOLOSTAR U-100 INSULIN 100 unit/mL (3 mL) inpn   No Yes   Sig: INJECT 32 UNITS SUBCUTANEOUSLY  NIGHTLY   QUEtiapine SR (SEROQUEL XR) 150 mg sr tablet   Yes Yes   Sig: Take 150 mg by mouth nightly as needed.   albuterol (ACCUNEB) 1.25 mg/3 mL nebu   No Yes   Sig: USE 1 VIAL IN NEBULIZER EVERY 6 HOURS AS NEEDED FOR  SHORTNESS  OF  BREATH,  WHEEZING   butalbital-acetaminophen-caffeine (FIORICET, ESGIC) 50-325-40 mg per tablet   No Yes   Sig: Take 1 Tab by mouth every six (6) hours as needed for Headache. Indications: tension headache   cloNIDine HCl (CATAPRES) 0.1 mg tablet   No Yes   Sig: Take 3 Tabs by mouth two (2) times a day.   dilTIAZem CD (CARDIZEM CD) 180 mg ER capsule   No Yes   Sig: Take 1 Cap by mouth daily.   fluticasone propion-salmeterol (ADVAIR DISKUS) 100-50 mcg/dose diskus inhaler   No Yes   Sig: Take 1 Puff by inhalation two (2) times a day.   gabapentin (NEURONTIN) 300 mg capsule   No Yes   Sig: Take 1 Cap by mouth three (3) times daily. Max Daily Amount: 900 mg.   losartan (COZAAR) 25 mg tablet   No Yes   Sig: Take 0.5 Tabs by mouth daily.   magnesium oxide (MAG-OX) 400 mg tablet   Yes Yes   Sig: Take 400 mg by mouth daily.   metFORMIN ER (GLUCOPHAGE XR) 500 mg tablet   No Yes   Sig: TAKE 1 TABLET TWICE DAILY   montelukast (SINGULAIR)  10 mg tablet   No Yes   Sig: TAKE 1 TABLET EVERY DAY   ondansetron (ZOFRAN ODT) 4 mg disintegrating tablet   No Yes    Sig: Take 1 Tab by mouth every eight (8) hours as needed for Nausea. Indications: nausea   polyethylene glycol (MIRALAX) 17 gram packet   No Yes   Sig: Take 1 Packet by mouth daily.   Patient taking differently: Take 17 g by mouth daily as needed for Constipation.   rosuvastatin (CRESTOR) 5 mg tablet   No Yes   Sig: TAKE 1 TABLET BY MOUTH NIGHTLY   spironolactone (ALDACTONE) 100 mg tablet   No Yes   Sig: TAKE 1 TABLET EVERY DAY   tiotropium bromide (SPIRIVA RESPIMAT) 2.5 mcg/actuation inhaler   No Yes   Sig: Take 1 Puff by inhalation daily.   traZODone (DESYREL) 50 mg tablet   No Yes   Sig: Take 1 Tab by mouth nightly as needed for Sleep.      Facility-Administered Medications: None           REVIEW OF SYSTEMS:     _0  Unable to obtain  ROS due to  _1 mental status change  _2 sedated   _3 intubated   _4 Total of 12 systems reviewed as follows:  Constitutional: negative fever, negative chills, negative weight loss  Eyes:   negative visual changes  ENT:   negative sore throat, tongue or lip swelling  Respiratory:  negative cough, negative dyspnea  Cards:  negative for chest pain, palpitations, lower extremity edema  GI:   negative for nausea, vomiting, diarrhea, and abdominal pain  Genitourinary: negative for frequency, dysuria  Integument:  negative for rash and pruritus  Hematologic:  negative for easy bruising and gum/nose bleeding  Musculoskel: negative for myalgias,  back pain and muscle weakness  Neurological:  negative for headaches, dizziness, vertigo  Behavl/Psych: negative for feelings of anxiety, depression       Objective:   VITALS:    Visit Vitals  BP 106/73 (BP 1 Location: Right arm, BP Patient Position: At rest)   Pulse (!) 109   Temp 99.1 ??F (37.3 ??C)   Resp 17   Ht _5  (1.727 m)   Wt 85.3 kg (188 lb)   SpO2 100%   BMI 28.59 kg/m??     Temp (24hrs), Avg:99.1 ??F (37.3 ??C), Min:99.1 ??F (37.3 ??C), Max:99.1 ??F (37.3 ??C)      PHYSICAL EXAM:    General:    Patient is in severe respiratory distress, now on BiPAP     Head:   Normocephalic, without obvious abnormality, atraumatic.  Eyes:   Conjunctivae clear, anicteric sclerae.  Pupils are equal  Nose:  Nares normal. No drainage or sinus tenderness.  Throat:    Lips, mucosa, and tongue normal.  No Thrush  Neck:  Supple, symmetrical,  no adenopathy, thyroid: non tender    no carotid bruit and no JVD.  Back:    Symmetric,  No CVA tenderness.  Lungs:   Bilateral expiratory wheezing, decreased breath sound at the base  Chest wall:  Tachypneic, using accessory muscles of respiration.,  Currently she is on BiPAP  Heart:   Regular rate and rhythm,  no murmur, rub or gallop.  Abdomen:   Soft, non-tender. Not distended.  Bowel sounds normal. No masses  Extremities: Extremities normal, atraumatic, No cyanosis.  No edema. No clubbing  Skin:     Texture, turgor normal. No rashes or lesions.  Not Jaundiced  Psych:  Extremely anxious.  Neurologic: EOMs intact. No facial asymmetry. No aphasia or slurred speech. Normal  strength, Alert and oriented X 3.       LAB DATA REVIEWED:    Recent Results (from the past 12 hour(s))   CBC WITH AUTOMATED DIFF    Collection Time: 08/22/18  2:10 PM   Result Value Ref Range    WBC 12.3 (H) 4.0 - 11.0 1000/mm3    RBC 4.42 3.60 - 5.20 M/uL    HGB 12.7 (L) 13.0 - 17.2 gm/dl    HCT 39.0 37.0 - 50.0 %    MCV 88.2 80.0 - 98.0 fL    MCH 28.7 25.4 - 34.6 pg    MCHC 32.6 30.0 - 36.0 gm/dl    PLATELET 200 140 - 450 1000/mm3    MPV 11.3 (H) 6.0 - 10.0 fL    RDW-SD 44.4 36.4 - 46.3      NRBC 0 0 - 0      IMMATURE GRANULOCYTES 0.3 0.0 - 3.0 %    NEUTROPHILS 86.6 (H) 34 - 64 %    LYMPHOCYTES 6.1 (L) 28 - 48 %    MONOCYTES 6.3 1 - 13 %    EOSINOPHILS 0.4 0 - 5 %    BASOPHILS 0.3 0 - 3 %   METABOLIC PANEL, COMPREHENSIVE    Collection Time: 08/22/18  2:10 PM   Result Value Ref Range    Sodium 139 136 - 145 mEq/L    Potassium 3.9 3.5 - 5.1 mEq/L    Chloride 103 98 - 107 mEq/L    CO2 29 21 - 32 mEq/L     Glucose 95 74 - 106 mg/dl    BUN 19 7 - 25 mg/dl    Creatinine 1.0 0.6 - 1.3 mg/dl    GFR est AA >60.0      GFR est non-AA 59      Calcium 9.2 8.5 - 10.1 mg/dl    AST (SGOT) 7 (L) 15 - 37 U/L    ALT (SGPT) 15 12 - 78 U/L    Alk. phosphatase 98 45 - 117 U/L    Bilirubin, total 0.2 0.2 - 1.0 mg/dl    Protein, total 7.7 6.4 - 8.2 gm/dl    Albumin 3.3 (L) 3.4 - 5.0 gm/dl    Anion gap 7 5 - 15 mmol/L   TROPONIN I    Collection Time: 08/22/18  2:10 PM   Result Value Ref Range    Troponin-I <0.015 0.000 - 0.045 ng/ml   NT-PRO BNP    Collection Time: 08/22/18  2:10 PM   Result Value Ref Range    NT pro-BNP 18.0 0.0 - 125.0 pg/ml   POC BLOOD GAS + LACTIC ACID    Collection Time: 08/22/18  2:40 PM   Result Value Ref Range    pH 7.400 7.350 - 7.450      PCO2 44.1 35.0 - 45.0 mm Hg    PO2 82.0 75 - 100 mm Hg    BICARBONATE 27.4 (H) 18.0 - 26.0 mmol/L    O2 SAT 96.0 90 - 100 %    CO2, TOTAL 29.0 24 - 29 mmol/L    BASE EXCESS 3 -2 - 3 mmol/L    Patient temp. 98.6 F      Sample type Art      FIO2 30      SITE R Radial      DEVICE BiPAP      ALLENS TEST Pass  IPAP/PIP 15      EPAP/CPAP/PEEP 8      Respiratory Rate 24      SET RATE 8      VTEXP 556           IMAGING RESULTS:    Xr Chest Sngl V    Result Date: 08/22/2018  Clinical history: Shortness of breath EXAMINATION: Single view of the chest 08/22/2018 Correlation: Chest radiograph 08/04/2018 and CTA chest 02/18/2018 FINDINGS: Trachea and heart size are within normal limits. Scarring/atelectasis left mid to lower lung. 2 cm left lower lobe nodule unchanged from chest radiograph of 08/04/2018 and CTA chest 02/18/2018. Right lung is clear.     IMPRESSION: 1. No acute pulmonary process. 2. 2 cm stable nodule left lower lobe unchanged from 08/04/2018 and evident on CT scan 02/18/2018.         Care Plan discussed with:     _0 Patient   _1 Family    _2 ED Care Manager  _3 ED Doc   _4 Specialist :    Total   Critical care time spent on reviewing the  case/data/notes/EMR,examining the patient,documentation,coordinating care with nurses and consultants is 40 minutes.       ___________________________________________________  Admitting Physician: Charlaine Dalton, MD     Dragon medical dictation software was used for portions of this report.  Unintended voice transcription errors may have occurred.

## 2018-08-22 NOTE — Consults (Signed)
[facility]       Pulmonary Consult Note    Patient: [name] [name]               Sex: female          DOA: 08/22/2018       Date of Birth:  [birth year]      Age:  68 y.o.        LOS:  LOS: 0 days        Reason for Consult: Worsening shortness of breath in the setting of known history of chronic obstructive lung disease    IMPRESSION:   ?? Worsening shortness of breath, shortness of breath, and wheezing due to COPD with exacerbation  ?? COPD with exacerbation  ?? GERD  ?? PTSD   RECOMMENDATIONS:   Continue BiPAP with current settings (15/8/30%).  Titrate FiO2, and target oxygen saturation greater than 88%.  Consider continuous albuterol nebs nebs for an hour significant chest tightness and wheezing.  Discontinue Breo.  Start Brovana and Pulmicort per nebulizer twice a day.  Start Yupelri through nebulizer once a day.  Continue Solu-Medrol 40 mg IV every 6 hours for now.  Continue Singulair once a day.  Continue to hold antihypertensive agent until improvement in blood pressure.  Start azithromycin for acute bronchitis.  Send a sputum Gram stain and culture  Continue subcu heparin for DVT prophylaxis  Will defer respective systems problem management to primary and other consultant and follow patient with primary and other team  Further recommendations will be based on the patient's response to recommended treatment and results of the investigation ordered.     History obtained from chart review, patient is on BiPAP  HPI:   [name] [name] is a 68 y.o. female who carries known history of chronic obstructive lung disease, arthritis, GERD, and stable left upper lobe pulmonary nodule presented to [facility] ER with interval increase in shortness of breath cough and wheezing.  At the time of presentation patient reported about stated symptoms present for past 2 days since this morning she has noticed significant interval  deterioration.  When she arrived at emergency room she was found hypoxemic and hypotensive.  Physical exam and demonstrated severe respiratory distress requiring initiation of noninvasive ventilation therapy.  Based on chart review it seems patient also reported productive cough present for past 2 or 3 days.  She denied any chest pain and/or any other symptoms.    Review of Systems  Review of Systems   Unable to perform ROS: Other         Past Medical History  Past Medical History:   Diagnosis Date   ??? Arthritis    ??? 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 DM2 (diabetes mellitus, type 2) ([place])    ??? GERD (gastroesophageal reflux disease)    ??? Hepatitis C     HEP C   ??? HTN (hypertension)    ??? Lung nodule, multiple     (CT 10/2016) Small left upper lobe and 1.7 x 1.6 cm left lower lobe nodules not significantly changed from 07/23/2016 and 03/22/2016   ??? Marijuana use    ??? PTSD (post-traumatic stress disorder)     lived through the [place] in 1993   ??? Thoracic ascending aortic aneurysm ([place])  4.4cm noted on CT Chest (10/2016)   ??? Thyroid nodule     2.5 cm stable right thyroid nodule (CT 10/2016)       Past Surgical History  Past Surgical History:   Procedure Laterality Date   ??? HX ENDOSCOPY     ??? HX HERNIA REPAIR  2019    x2   ??? HX HIP REPLACEMENT Right 01/29/2015   ??? HX HIP REPLACEMENT     ??? HX HYSTERECTOMY  2010    hysterectomy       Family History  Family History   Problem Relation Age of Onset   ??? Hypertension Mother    ??? Other Mother         OSA   ??? Cancer Father         suspected leukemia per pt's spouse   ??? Cancer Maternal Aunt    ??? Alcohol abuse Maternal Grandfather        Social History  Social History     Tobacco Use   ??? Smoking status: Former Smoker   ??? Smokeless tobacco: Never Used   ??? Tobacco comment: reported as quit smoking around 1990s per spouse   Substance Use Topics    ??? Alcohol use: No   ??? Drug use: Yes     Types: Marijuana        Medications  Current Facility-Administered Medications   Medication Dose Route Frequency   ??? [Held by provider] cloNIDine HCL (CATAPRES) tablet 0.3 mg  0.3 mg Oral BID   ??? [START ON 08/23/2018] dilTIAZem CD (CARDIZEM CD) capsule 180 mg  180 mg Oral DAILY   ??? gabapentin (NEURONTIN) capsule 300 mg  300 mg Oral TID   ??? [Held by provider] losartan (COZAAR) tablet 12.5 mg  12.5 mg Oral DAILY   ??? montelukast (SINGULAIR) tablet 10 mg  10 mg Oral QHS   ??? naloxone (NARCAN) injection 0.1 mg  0.1 mg IntraVENous PRN   ??? acetaminophen (TYLENOL) tablet 650 mg  650 mg Oral Q4H PRN    Or   ??? acetaminophen (TYLENOL) solution 650 mg  650 mg Oral Q4H PRN    Or   ??? acetaminophen (TYLENOL) suppository 650 mg  650 mg Rectal Q4H PRN   ??? HYDROcodone-acetaminophen (NORCO) 5-325 mg per tablet 1 Tab  1 Tab Oral Q4H PRN   ??? ondansetron (ZOFRAN) injection 4 mg  4 mg IntraVENous Q4H PRN   ??? MAGNESIUM ELECTROLYTE REPLACEMENT PROTOCOL STANDARD DOSING  1 Each Other PRN   ??? POTASSIUM ELECTROLYTE REPLACEMENT PROTOCOL STANDARD DOSING  1 Each Other PRN   ??? CALCIUM ELECTROLYTE REPLACEMENT PROTOCOL STANDARD DOSING  1 Each Other PRN   ??? PHOSPHATE ELECTROLYTE REPLACEMENT PROTOCOL STANDARD DOSING  1 Each Other PRN   ??? heparin (porcine) injection 5,000 Units  5,000 Units SubCUTAneous Q8H   ??? 0.9% sodium chloride infusion  100 mL/hr IntraVENous CONTINUOUS   ??? methylPREDNISolone (PF) (SOLU-MEDROL) injection 40 mg  40 mg IntraVENous Q6H   ??? [START ON 08/23/2018] fluticasone-vilanterol (BREO ELLIPTA) 174mg-25mcg/puff  1 Puff Inhalation DAILY     Prior to Admission medications    Medication Sig Start Date End Date Taking? Authorizing Provider   butalbital-acetaminophen-caffeine (FIORICET, ESGIC) 50-325-40 mg per tablet Take 1 Tab by mouth every six (6) hours as needed for Headache. Indications: tension headache 08/08/18  Yes CMarzella Schlein MD    LANTUS SOLOSTAR U-100 INSULIN 100 unit/mL (3 mL) inpn INJECT 32 UNITS SUBCUTANEOUSLY  NIGHTLY 06/12/18  Yes BWallene Huh PA-C  albuterol (ACCUNEB) 1.25 mg/3 mL nebu USE 1 VIAL IN NEBULIZER EVERY 6 HOURS AS NEEDED FOR  SHORTNESS  OF  BREATH,  WHEEZING 05/30/18  Yes Fransisco Beau, Monique, PA-C   losartan (COZAAR) 25 mg tablet Take 0.5 Tabs by mouth daily. 05/20/18  Yes Wallene Huh, PA-C   cloNIDine HCl (CATAPRES) 0.1 mg tablet Take 3 Tabs by mouth two (2) times a day. 05/20/18  Yes Wallene Huh, PA-C   spironolactone (ALDACTONE) 100 mg tablet TAKE 1 TABLET EVERY DAY 04/27/18  Yes Wallene Huh, PA-C   metFORMIN ER (GLUCOPHAGE XR) 500 mg tablet TAKE 1 TABLET TWICE DAILY 03/30/18  Yes Fransisco Beau, Monique, PA-C   montelukast (SINGULAIR) 10 mg tablet TAKE 1 TABLET EVERY DAY 03/26/18  Yes Wallene Huh, PA-C   gabapentin (NEURONTIN) 300 mg capsule Take 1 Cap by mouth three (3) times daily. Max Daily Amount: 900 mg. 03/01/18  Yes Fransisco Beau, Monique, PA-C   polyethylene glycol (MIRALAX) 17 gram packet Take 1 Packet by mouth daily.  Patient taking differently: Take 17 g by mouth daily as needed for Constipation. 02/24/18  Yes Mehmood, Khalid, MD   magnesium oxide (MAG-OX) 400 mg tablet Take 400 mg by mouth daily.   Yes Provider, Historical   QUEtiapine SR (SEROQUEL XR) 150 mg sr tablet Take 150 mg by mouth nightly as needed.   Yes Provider, Historical   dilTIAZem CD (CARDIZEM CD) 180 mg ER capsule Take 1 Cap by mouth daily. 01/02/18  Yes Fransisco Beau, Monique, PA-C   ondansetron (ZOFRAN ODT) 4 mg disintegrating tablet Take 1 Tab by mouth every eight (8) hours as needed for Nausea. Indications: nausea 12/24/17  Yes Hilton Sinclair, PA-C   tiotropium bromide (SPIRIVA RESPIMAT) 2.5 mcg/actuation inhaler Take 1 Puff by inhalation daily. 10/11/17  Yes Holladay, Lorra Hals, MD   fluticasone propion-salmeterol (ADVAIR DISKUS) 100-50 mcg/dose diskus inhaler Take 1 Puff by inhalation two (2) times a day. 10/11/17  Yes Holladay, Lorra Hals, MD    traZODone (DESYREL) 50 mg tablet Take 1 Tab by mouth nightly as needed for Sleep. 08/09/17  Yes Einar Grad, MD   rosuvastatin (CRESTOR) 5 mg tablet TAKE 1 TABLET BY MOUTH NIGHTLY 07/13/17  Yes Lake Bells, MD       Allergy  Allergies   Allergen Reactions   ??? Chocolate [Cocoa] Sneezing     Confirms allergy but reports "I still eat it"       Physical Exam:   Vital Signs:    Blood pressure 108/77, pulse (!) 102, temperature 99.1 ??F (37.3 ??C), resp. rate 20, height 5' 8" (1.727 m), weight 85.3 kg (188 lb), SpO2 99 %. Body mass index is 28.59 kg/m??.    O2 Device: BIPAP       Temp (24hrs), Avg:99.1 ??F (37.3 ??C), Min:99.1 ??F (37.3 ??C), Max:99.1 ??F (37.3 ??C)       Intake/Output:   Last shift:      No intake/output data recorded.  Last 3 shifts: No intake/output data recorded.  No intake or output data in the 24 hours ending 08/22/18 1713   Physical Exam  Vitals signs and nursing note reviewed.   HENT:      Head: Normocephalic and atraumatic.   Eyes:      Pupils: Pupils are equal, round, and reactive to light.   Cardiovascular:      Rate and Rhythm: Regular rhythm.   Pulmonary:      Breath sounds: Wheezing present.   Abdominal:      General: Bowel  sounds are normal.      Palpations: Abdomen is soft.   Skin:     General: Skin is warm and dry.   Neurological:      General: No focal deficit present.      Mental Status: She is alert and oriented to person, place, and time.             Labs Reviewed:  Recent Results (from the past 24 hour(s))   CBC WITH AUTOMATED DIFF    Collection Time: 08/22/18  2:10 PM   Result Value Ref Range    WBC 12.3 (H) 4.0 - 11.0 1000/mm3    RBC 4.42 3.60 - 5.20 M/uL    HGB 12.7 (L) 13.0 - 17.2 gm/dl    HCT 39.0 37.0 - 50.0 %    MCV 88.2 80.0 - 98.0 fL    MCH 28.7 25.4 - 34.6 pg    MCHC 32.6 30.0 - 36.0 gm/dl    PLATELET 200 140 - 450 1000/mm3    MPV 11.3 (H) 6.0 - 10.0 fL    RDW-SD 44.4 36.4 - 46.3      NRBC 0 0 - 0      IMMATURE GRANULOCYTES 0.3 0.0 - 3.0 %    NEUTROPHILS 86.6 (H) 34 - 64 %     LYMPHOCYTES 6.1 (L) 28 - 48 %    MONOCYTES 6.3 1 - 13 %    EOSINOPHILS 0.4 0 - 5 %    BASOPHILS 0.3 0 - 3 %   METABOLIC PANEL, COMPREHENSIVE    Collection Time: 08/22/18  2:10 PM   Result Value Ref Range    Sodium 139 136 - 145 mEq/L    Potassium 3.9 3.5 - 5.1 mEq/L    Chloride 103 98 - 107 mEq/L    CO2 29 21 - 32 mEq/L    Glucose 95 74 - 106 mg/dl    BUN 19 7 - 25 mg/dl    Creatinine 1.0 0.6 - 1.3 mg/dl    GFR est AA >60.0      GFR est non-AA 59      Calcium 9.2 8.5 - 10.1 mg/dl    AST (SGOT) 7 (L) 15 - 37 U/L    ALT (SGPT) 15 12 - 78 U/L    Alk. phosphatase 98 45 - 117 U/L    Bilirubin, total 0.2 0.2 - 1.0 mg/dl    Protein, total 7.7 6.4 - 8.2 gm/dl    Albumin 3.3 (L) 3.4 - 5.0 gm/dl    Anion gap 7 5 - 15 mmol/L   TROPONIN I    Collection Time: 08/22/18  2:10 PM   Result Value Ref Range    Troponin-I <0.015 0.000 - 0.045 ng/ml   NT-PRO BNP    Collection Time: 08/22/18  2:10 PM   Result Value Ref Range    NT pro-BNP 18.0 0.0 - 125.0 pg/ml   POC BLOOD GAS + LACTIC ACID    Collection Time: 08/22/18  2:40 PM   Result Value Ref Range    pH 7.400 7.350 - 7.450      PCO2 44.1 35.0 - 45.0 mm Hg    PO2 82.0 75 - 100 mm Hg    BICARBONATE 27.4 (H) 18.0 - 26.0 mmol/L    O2 SAT 96.0 90 - 100 %    CO2, TOTAL 29.0 24 - 29 mmol/L    BASE EXCESS 3 -2 - 3 mmol/L    Patient temp. 98.6 F  Sample type Art      FIO2 30      SITE R Radial      DEVICE BiPAP      ALLENS TEST Pass      IPAP/PIP 15      EPAP/CPAP/PEEP 8      Respiratory Rate 24      SET RATE 8      VTEXP 556     INFLUENZA A/B, BY PCR    Collection Time: 08/22/18  3:10 PM   Result Value Ref Range    Influenza A PCR NEGATIVE NEGATIVE      Influenza B PCR NEGATIVE NEGATIVE             No results for input(s): FIO2I, IFO2, HCO3I, IHCO3, HCOPOC, PCO2I, PCOPOC, IPHI, PHI, PHPOC, PO2I, PO2POC in the last 72 hours.    No lab exists for component: IPOC2    All Micro Results     Procedure Component Value Units Date/Time     CULTURE, RESPIRATORY/SPUTUM/BRONCH Verdie Drown [[id number]]     Order Status:  No result Specimen:  Sputum                Imaging:  [x]I have personally reviewed the patient???s chest radiographs images and report with the patient  Results from Hospital Encounter encounter on 08/22/18   XR CHEST SNGL V    Narrative Clinical history: Shortness of breath    EXAMINATION:  Single view of the chest 08/22/2018    Correlation: Chest radiograph 08/04/2018 and CTA chest 02/18/2018    FINDINGS:  Trachea and heart size are within normal limits. Scarring/atelectasis left mid  to lower lung. 2 cm left lower lobe nodule unchanged from chest radiograph of  08/04/2018 and CTA chest 02/18/2018. Right lung is clear.      Impression IMPRESSION:  1. No acute pulmonary process.  2. 2 cm stable nodule left lower lobe unchanged from 08/04/2018 and evident on CT  scan 02/18/2018.         Results from [place] encounter on 05/11/18   CT ABD PELV W CONT    Narrative INDICATION: Abdominal pain    COMPARISON: CT dated 02/22/2018    TECHNIQUE:   CT of the abdomen and pelvis WITH intravenous contrast. DICOM format image data  is available to non-affiliated external healthcare facilities or entities on a  secure, media free, reciprocally searchable basis with patient authorization for  12 months following the date of the study.    Findings:    Scattered emphysema, most pronounced within the right middle lobe. Left lung  base subsegmental atelectasis or scarring.    Liver, gallbladder, and pancreas grossly unremarkable. Mild thickening of the  adrenal glands. Scattered indeterminant splenic hypodensities, measuring up to  1.6 cm. Findings may reflect small pseudocysts or hemangioma. Too small to  characterize bilateral renal hypodensities and a 1 cm right renal cyst.  Nonobstructing 2 mm right renal calculus.    Extensive sigmoid colonic diverticulosis. There is suggestion of very subtle   inflammatory change surrounding a short segment of the sigmoid colon, concerning  for very mild/early acute diverticulitis. The splenic flexure appears thickened,  which may be related to decompression. A normal appendix.    Patient appears to be status post umbilical and supraumbilical ventral abdominal  wall herniorrhaphy. There is prominent 3.2 x 1.6 cm stranding situated within  the subcutaneous tissues of the previous supraumbilical ventral abdominal wall  hernia, with edema and inflammation extending to the underlying  intra-abdominal  fat/greater omentum. Tiny pockets of fluid (less than 1 cm) are noted along the  ventral aspect of the left hepatic lobe, interposed between the liver and the  anterior abdominal wall (image 16 on series 2). A small 1.3 cm nodular density  within the left upper quadrant, involving the greater omentum (image 21).    Hysterectomy.    Right hip arthroplasty.      Impression IMPRESSION:  1. Patient appears to be status post umbilical and supraumbilical ventral  abdominal wall herniorrhaphy. Prominent subcutaneous inflammatory change  overlying the previous supraumbilical hernia. This may reflect inflammation,  phlegmon or fat necrosis. No obvious abscess.  2. Intra-abdominal inflammation deep to the a forementioned subcutaneous  supraumbilical hernia repair, involving the greater omentum with tiny locules of  fluid abutting the anterior surface of the left hepatic lobe. This likely  reflects postoperative changes. Developing infectious process cannot be  excluded. No drainable fluid collection or abscess.  3. Findings which may reflect a very mild/early diverticulitis of the sigmoid  colon. Clinical correlation is recommended.  4. Nonobstructing right renal calculus.  5. Emphysematous changes.  6. Stable indeterminate splenic hypodensities. These may reflect tiny  pseudocysts or hemangioma. Follow-up CT in 12 months to ensure stability.           [x]See my orders for details     My assessment, plan of care, findings, medications, side effects etc were discussed with:  [x]nursing []PT/OT    [x]respiratory therapy []Dr.   Regis Bill []Patient     [x]Total care time exclusive of procedures 35 minutes with complex decision making performed and > 50% time spent in face to face consultation.     Donavan Foil, MD

## 2018-08-22 NOTE — ED Notes (Signed)
TRANSFER - OUT REPORT:    Verbal report given on [name] [name]  being transferred to 3 west(unit) for routine progression of care       Report consisted of patient???s Situation, Background, Assessment and   Recommendations(SBAR).     Information from the following report(s) Kardex, ED Summary, MAR and Recent Results was reviewed with the receiving nurse.    Lines:   Peripheral IV 08/22/18 Right Forearm (Active)   Site Assessment Clean, dry, & intact 08/22/2018  2:10 PM   Phlebitis Assessment 0 08/22/2018  2:10 PM   Infiltration Assessment 0 08/22/2018  2:10 PM   Dressing Status Clean, dry, & intact 08/22/2018  2:10 PM   Dressing Type Transparent 08/22/2018  2:10 PM        Opportunity for questions and clarification was provided.      Patient transported with:RN and on BIPAP

## 2018-08-22 NOTE — Progress Notes (Signed)
Bedside and Verbal shift change report given to Barbara, RN (oncoming nurse) by Erin,RN (offgoing nurse). Report included the following information SBAR, Kardex, ED Summary, MAR, Recent Results and Cardiac Rhythm ST.

## 2018-08-22 NOTE — ED Triage Notes (Signed)
Patient brought in by ems in respiratory failure from home, patient has hx of asthma and COPD and reports her breathing got worse this morning despite taking her meds, was given breathing treatment by ems enroute, patient is alert and oriented
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
## 2018-08-22 NOTE — Progress Notes (Signed)
Code R Consult Received.    After Hours Consult:      No    Last admission dc date and dx      08/08/2018     COPD    What was the discharge plan    home    Now presents      SOB    Case Discussed with ED Physician:   N    Case Discussed with Attending Physician:    N    Appropriate for Admission:      Yes Inpatient     Anticipated Discharge Needs:   home    Disposition if discharged from the ED         Readmission interview done (epic)     N    Readmission assessment completed (compare AD)   Y    [facility] complete Y

## 2018-08-22 NOTE — Progress Notes (Signed)
[facility]: Medication History    Medication History Completed Prior to Order Reconciliation?  YES  If "no" and discrepancies were noted please contact attending physician or pharmacist to follow-up: N/A - Medication history was obtained prior to reconciliation.    Information obtained from (list all that apply, 2 sources preferred): Patient and Other: MEDICATION DISCHARGE LIST   If a history was not reviewed directly with patient/caregiver please comment with the reason why: N/A     Antibiotic use in the last 90 days (3 months): NO      Missing Medication Identified  NO  Number of medications: -  Indicate action taken: N/A      Wrong Medication Identified NO   Number of medications: -  Indicate action taken: N/A      Wrong Dose/Interval/Route Identified NO              Number of medications: -   Indicate action taken: N/A        Is patient currently taking warfarin:  No        Medication Compliance Issues and/or Medication Concerns: N/A      Allergies: Chocolate [cocoa]    Prior to Admission Medications:    Prior to Admission Medications   Prescriptions Last Dose Informant Patient Reported? Taking?   LANTUS SOLOSTAR U-100 INSULIN 100 unit/mL (3 mL) inpn   No Yes   Sig: INJECT 32 UNITS SUBCUTANEOUSLY  NIGHTLY   QUEtiapine SR (SEROQUEL XR) 150 mg sr tablet   Yes Yes   Sig: Take 150 mg by mouth nightly as needed.   albuterol (ACCUNEB) 1.25 mg/3 mL nebu   No Yes   Sig: USE 1 VIAL IN NEBULIZER EVERY 6 HOURS AS NEEDED FOR  SHORTNESS  OF  BREATH,  WHEEZING   butalbital-acetaminophen-caffeine (FIORICET, ESGIC) 50-325-40 mg per tablet   No Yes   Sig: Take 1 Tab by mouth every six (6) hours as needed for Headache. Indications: tension headache   cloNIDine HCl (CATAPRES) 0.1 mg tablet   No Yes   Sig: Take 3 Tabs by mouth two (2) times a day.   dilTIAZem CD (CARDIZEM CD) 180 mg ER capsule   No Yes   Sig: Take 1 Cap by mouth daily.   fluticasone propion-salmeterol (ADVAIR DISKUS) 100-50 mcg/dose diskus  inhaler   No Yes   Sig: Take 1 Puff by inhalation two (2) times a day.   gabapentin (NEURONTIN) 300 mg capsule   No Yes   Sig: Take 1 Cap by mouth three (3) times daily. Max Daily Amount: 900 mg.   losartan (COZAAR) 25 mg tablet   No Yes   Sig: Take 0.5 Tabs by mouth daily.   magnesium oxide (MAG-OX) 400 mg tablet   Yes Yes   Sig: Take 400 mg by mouth daily.   metFORMIN ER (GLUCOPHAGE XR) 500 mg tablet   No Yes   Sig: TAKE 1 TABLET TWICE DAILY   montelukast (SINGULAIR) 10 mg tablet   No Yes   Sig: TAKE 1 TABLET EVERY DAY   ondansetron (ZOFRAN ODT) 4 mg disintegrating tablet   No Yes   Sig: Take 1 Tab by mouth every eight (8) hours as needed for Nausea. Indications: nausea   polyethylene glycol (MIRALAX) 17 gram packet   No Yes   Sig: Take 1 Packet by mouth daily.   Patient taking differently: Take 17 g by mouth daily as needed for Constipation.   rosuvastatin (CRESTOR) 5 mg tablet   No  Yes   Sig: TAKE 1 TABLET BY MOUTH NIGHTLY   spironolactone (ALDACTONE) 100 mg tablet   No Yes   Sig: TAKE 1 TABLET EVERY DAY   tiotropium bromide (SPIRIVA RESPIMAT) 2.5 mcg/actuation inhaler   No Yes   Sig: Take 1 Puff by inhalation daily.   traZODone (DESYREL) 50 mg tablet   No Yes   Sig: Take 1 Tab by mouth nightly as needed for Sleep.      Facility-Administered Medications: None         [name] [name]   Contact: [phone]
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
## 2018-08-22 NOTE — Progress Notes (Signed)
 [facility]: Medication History    Medication History Completed Prior to Order Reconciliation?  YES  If no and discrepancies were noted please contact attending physician or pharmacist to follow-up: N/A - Medication history was obtained prior to reconciliation.    Information obtained from (list all that apply, 2 sources preferred): Patient and Other: MEDICATION DISCHARGE LIST   If a history was not reviewed directly with patient/caregiver please comment with the reason why: N/A     Antibiotic use in the last 90 days (3 months): NO      Missing Medication Identified  NO  Number of medications: -  Indicate action taken: N/A      Wrong Medication Identified NO   Number of medications: -  Indicate action taken: N/A      Wrong Dose/Interval/Route Identified NO              Number of medications: -   Indicate action taken: N/A        Is patient currently taking warfarin:  No        Medication Compliance Issues and/or Medication Concerns: N/A      Allergies: Chocolate [cocoa]    Prior to Admission Medications:    Prior to Admission Medications   Prescriptions Last Dose Informant Patient Reported? Taking?   LANTUS  SOLOSTAR U-100 INSULIN  100 unit/mL (3 mL) inpn   No Yes   Sig: INJECT 32 UNITS SUBCUTANEOUSLY  NIGHTLY   QUEtiapine  SR (SEROQUEL  XR) 150 mg sr tablet   Yes Yes   Sig: Take 150 mg by mouth nightly as needed.   albuterol  (ACCUNEB ) 1.25 mg/3 mL nebu   No Yes   Sig: USE 1 VIAL IN NEBULIZER EVERY 6 HOURS AS NEEDED FOR  SHORTNESS  OF  BREATH,  WHEEZING   butalbital-acetaminophen -caffeine (FIORICET, ESGIC) 50-325-40 mg per tablet   No Yes   Sig: Take 1 Tab by mouth every six (6) hours as needed for Headache. Indications: tension headache   cloNIDine HCl (CATAPRES) 0.1 mg tablet   No Yes   Sig: Take 3 Tabs by mouth two (2) times a day.   dilTIAZem CD (CARDIZEM CD) 180 mg ER capsule   No Yes   Sig: Take 1 Cap by mouth daily.   fluticasone  propion-salmeterol (ADVAIR DISKUS) 100-50 mcg/dose diskus inhaler   No  Yes   Sig: Take 1 Puff by inhalation two (2) times a day.   gabapentin (NEURONTIN) 300 mg capsule   No Yes   Sig: Take 1 Cap by mouth three (3) times daily. Max Daily Amount: 900 mg.   losartan  (COZAAR ) 25 mg tablet   No Yes   Sig: Take 0.5 Tabs by mouth daily.   magnesium  oxide (MAG-OX) 400 mg tablet   Yes Yes   Sig: Take 400 mg by mouth daily.   metFORMIN ER (GLUCOPHAGE XR) 500 mg tablet   No Yes   Sig: TAKE 1 TABLET TWICE DAILY   montelukast  (SINGULAIR ) 10 mg tablet   No Yes   Sig: TAKE 1 TABLET EVERY DAY   ondansetron  (ZOFRAN  ODT) 4 mg disintegrating tablet   No Yes   Sig: Take 1 Tab by mouth every eight (8) hours as needed for Nausea. Indications: nausea   polyethylene glycol (MIRALAX ) 17 gram packet   No Yes   Sig: Take 1 Packet by mouth daily.   Patient taking differently: Take 17 g by mouth daily as needed for Constipation.   rosuvastatin  (CRESTOR ) 5 mg tablet   No  Yes   Sig: TAKE 1 TABLET BY MOUTH NIGHTLY   spironolactone  (ALDACTONE ) 100 mg tablet   No Yes   Sig: TAKE 1 TABLET EVERY DAY   tiotropium bromide (SPIRIVA RESPIMAT) 2.5 mcg/actuation inhaler   No Yes   Sig: Take 1 Puff by inhalation daily.   traZODone (DESYREL) 50 mg tablet   No Yes   Sig: Take 1 Tab by mouth nightly as needed for Sleep.      Facility-Administered Medications: None         [name] [name]   Contact: [phone]

## 2018-08-22 NOTE — Progress Notes (Signed)
Bedside and Verbal shift change report given to Britta Mccreedy, Charity fundraiser (oncoming nurse) by Perlie Mayo (offgoing nurse). Report included the following information SBAR, Kardex, ED Summary, MAR, Recent Results and Cardiac Rhythm ST.

## 2018-08-22 NOTE — Progress Notes (Signed)
 prelim code r note    Last admission date 2/23-2/26    reason for last admission COPD AE    MD camarato    disposition at discharge home    [employer]

## 2018-08-22 NOTE — Consults (Signed)
Consults  by Donavan Foil, MD at 08/22/18 1713                Author: Donavan Foil, MD  Service: Pulmonary Disease  Author Type: Physician       Filed: 08/22/18 1718  Date of Service: 08/22/18 1713  Status: Signed          Editor: Donavan Foil, MD (Physician)                       [facility] PULMONARY AND CRITICAL CARE MEDICINE          Pulmonary Consult Note      Patient: [name] [name]               Sex: female          DOA: 08/22/2018         Date of Birth:  [birth year]      Age:  68 y.o.        LOS:  LOS: 0 days          Reason for Consult: Worsening shortness of breath in the setting of known history of chronic obstructive lung disease        IMPRESSION:     ??    Worsening shortness of breath, shortness of breath, and wheezing due to COPD with exacerbation   ??  COPD with exacerbation   ??  GERD   ??  PTSD       RECOMMENDATIONS:       Continue BiPAP with current settings (15/8/30%).  Titrate FiO2, and target oxygen saturation greater than 88%.   Consider continuous albuterol nebs nebs for an hour significant chest tightness and wheezing.   Discontinue Breo.  Start Brovana and Pulmicort per nebulizer twice a day.  Start Yupelri through nebulizer once a day.   Continue Solu-Medrol 40 mg IV every 6 hours for now.  Continue Singulair once a day.   Continue to hold antihypertensive agent until improvement in blood pressure.   Start azithromycin for acute bronchitis.  Send a sputum Gram stain and culture   Continue subcu heparin for DVT prophylaxis   Will defer respective systems problem management to primary and other consultant and follow patient with primary and other team   Further recommendations will be based on the patient's response to recommended treatment and results of the investigation ordered.        History obtained from chart review, patient is on BiPAP     HPI:     Ms.  GABBRIELLE MCNICHOLAS is a  68 y.o. female who carries known history of chronic obstructive lung disease, arthritis,  GERD,  and stable left upper lobe pulmonary nodule presented to [facility] ER with interval increase in shortness of breath cough and wheezing.   At the time of presentation patient reported about stated symptoms present for past 2 days since this morning she has noticed significant interval deterioration.  When she arrived at emergency room she was found hypoxemic and hypotensive.  Physical exam  and demonstrated severe respiratory distress requiring initiation of noninvasive ventilation therapy.  Based on chart review it seems patient also reported productive cough present for past 2 or 3 days.  She denied any chest pain and/or any other symptoms.      Review of Systems   Review of Systems    Unable to perform ROS: Other              Past  Medical History     Past Medical History:        Diagnosis  Date         ?  Arthritis       ?  Chronic pain syndrome            related to R hip replacement         ?  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  DM2 (diabetes mellitus, type 2) ([place])       ?  GERD (gastroesophageal reflux disease)       ?  Hepatitis C            HEP C         ?  HTN (hypertension)       ?  Lung nodule, multiple            (CT 10/2016) Small left upper lobe and 1.7 x 1.6 cm left lower lobe nodules not significantly changed from 07/23/2016 and 03/22/2016         ?  Marijuana use       ?  PTSD (post-traumatic stress disorder)            lived through the [place] in 1993         ?  Thoracic ascending aortic aneurysm ([facility])            4.4cm noted on CT Chest (10/2016)         ?  Thyroid nodule            2.5 cm stable right thyroid nodule (CT 10/2016)           Past Surgical History     Past Surgical History:         Procedure  Laterality  Date          ?  HX ENDOSCOPY         ?  HX HERNIA REPAIR    2019          x2          ?  HX HIP REPLACEMENT  Right  01/29/2015     ?  HX HIP REPLACEMENT         ?  HX HYSTERECTOMY    2010           hysterectomy           Family History     Family History         Problem  Relation  Age of Onset          ?  Hypertension  Mother       ?  Other  Mother                OSA          ?  Cancer  Father                suspected leukemia per pt's spouse          ?  Cancer  Maternal Aunt            ?  Alcohol abuse  Maternal Grandfather             Social History     Social History  Tobacco Use         ?  Smoking status:  Former Smoker     ?  Smokeless tobacco:  Never Used        ?  Tobacco comment: reported as quit smoking around 1990s per spouse       Substance Use Topics         ?  Alcohol use:  No     ?  Drug use:  Yes              Types:  Marijuana            Medications     Current Facility-Administered Medications          Medication  Dose  Route  Frequency           ?  [Held by provider] cloNIDine HCL (CATAPRES) tablet 0.3 mg   0.3 mg  Oral  BID     ?  [START ON 08/23/2018] dilTIAZem CD (CARDIZEM CD) capsule 180 mg   180 mg  Oral  DAILY     ?  gabapentin (NEURONTIN) capsule 300 mg   300 mg  Oral  TID     ?  [Held by provider] losartan (COZAAR) tablet 12.5 mg   12.5 mg  Oral  DAILY     ?  montelukast (SINGULAIR) tablet 10 mg   10 mg  Oral  QHS     ?  naloxone (NARCAN) injection 0.1 mg   0.1 mg  IntraVENous  PRN     ?  acetaminophen (TYLENOL) tablet 650 mg   650 mg  Oral  Q4H PRN          Or           ?  acetaminophen (TYLENOL) solution 650 mg   650 mg  Oral  Q4H PRN          Or           ?  acetaminophen (TYLENOL) suppository 650 mg   650 mg  Rectal  Q4H PRN     ?  HYDROcodone-acetaminophen (NORCO) 5-325 mg per tablet 1 Tab   1 Tab  Oral  Q4H PRN     ?  ondansetron (ZOFRAN) injection 4 mg   4 mg  IntraVENous  Q4H PRN           ?  MAGNESIUM ELECTROLYTE REPLACEMENT PROTOCOL STANDARD DOSING   1 Each  Other  PRN           ?  POTASSIUM ELECTROLYTE REPLACEMENT PROTOCOL STANDARD DOSING   1 Each  Other  PRN     ?  CALCIUM ELECTROLYTE REPLACEMENT PROTOCOL STANDARD DOSING   1 Each  Other  PRN     ?  PHOSPHATE  ELECTROLYTE REPLACEMENT PROTOCOL STANDARD DOSING   1 Each  Other  PRN     ?  heparin (porcine) injection 5,000 Units   5,000 Units  SubCUTAneous  Q8H     ?  0.9% sodium chloride infusion   100 mL/hr  IntraVENous  CONTINUOUS     ?  methylPREDNISolone (PF) (SOLU-MEDROL) injection 40 mg   40 mg  IntraVENous  Q6H           ?  [START ON 08/23/2018] fluticasone-vilanterol (BREO ELLIPTA) 170mg-25mcg/puff   1 Puff  Inhalation  DAILY          Prior to Admission medications  Medication  Sig  Start Date  End Date  Taking?  Authorizing Provider            butalbital-acetaminophen-caffeine (FIORICET, ESGIC) 50-325-40 mg per tablet  Take 1 Tab by mouth every six (6) hours as needed for Headache. Indications: tension headache  08/08/18    Yes  Marzella Schlein, MD     LANTUS SOLOSTAR U-100 INSULIN 100 unit/mL (3 mL) inpn  INJECT 32 UNITS SUBCUTANEOUSLY  NIGHTLY  06/12/18    Yes  Brock, Monique, PA-C     albuterol (ACCUNEB) 1.25 mg/3 mL nebu  USE 1 VIAL IN NEBULIZER EVERY 6 HOURS AS NEEDED FOR  SHORTNESS  OF  BREATH,  WHEEZING  05/30/18    Yes  Fransisco Beau, Monique, PA-C     losartan (COZAAR) 25 mg tablet  Take 0.5 Tabs by mouth daily.  05/20/18    Yes  Wallene Huh, PA-C     cloNIDine HCl (CATAPRES) 0.1 mg tablet  Take 3 Tabs by mouth two (2) times a day.  05/20/18    Yes  Wallene Huh, PA-C     spironolactone (ALDACTONE) 100 mg tablet  TAKE 1 TABLET EVERY DAY  04/27/18    Yes  Wallene Huh, PA-C     metFORMIN ER (GLUCOPHAGE XR) 500 mg tablet  TAKE 1 TABLET TWICE DAILY  03/30/18    Yes  Fransisco Beau, Monique, PA-C     montelukast (SINGULAIR) 10 mg tablet  TAKE 1 TABLET EVERY DAY  03/26/18    Yes  Wallene Huh, PA-C            gabapentin (NEURONTIN) 300 mg capsule  Take 1 Cap by mouth three (3) times daily. Max Daily Amount: 900 mg.  03/01/18    Yes  Fransisco Beau, Monique, PA-C            polyethylene glycol (MIRALAX) 17 gram packet  Take 1 Packet by mouth daily.   Patient taking differently: Take 17 g by mouth daily as needed for  Constipation.  02/24/18    Yes  Mehmood, Khalid, MD     magnesium oxide (MAG-OX) 400 mg tablet  Take 400 mg by mouth daily.      Yes  Provider, Historical     QUEtiapine SR (SEROQUEL XR) 150 mg sr tablet  Take 150 mg by mouth nightly as needed.      Yes  Provider, Historical     dilTIAZem CD (CARDIZEM CD) 180 mg ER capsule  Take 1 Cap by mouth daily.  01/02/18    Yes  Fransisco Beau, Monique, PA-C     ondansetron (ZOFRAN ODT) 4 mg disintegrating tablet  Take 1 Tab by mouth every eight (8) hours as needed for Nausea. Indications: nausea  12/24/17    Yes  Hilton Sinclair, PA-C     tiotropium bromide (SPIRIVA RESPIMAT) 2.5 mcg/actuation inhaler  Take 1 Puff by inhalation daily.  10/11/17    Yes  Holladay, Lorra Hals, MD     fluticasone propion-salmeterol (ADVAIR DISKUS) 100-50 mcg/dose diskus inhaler  Take 1 Puff by inhalation two (2) times a day.  10/11/17    Yes  Holladay, Lorra Hals, MD     traZODone (DESYREL) 50 mg tablet  Take 1 Tab by mouth nightly as needed for Sleep.  08/09/17    Yes  Einar Grad, MD            rosuvastatin (CRESTOR) 5 mg tablet  TAKE 1 TABLET BY MOUTH NIGHTLY  07/13/17    Yes  Lake Bells, MD           Allergy     Allergies        Allergen  Reactions         ?  Chocolate [Cocoa]  Sneezing             Confirms allergy but reports "I still eat it"             Physical Exam:     Vital Signs:       Blood pressure 108/77, pulse (!) 102 , temperature 99.1 ??F (37.3 ??C), resp. rate 20, height '5\' 8"'  (1.727 m), weight 85.3 kg (188 lb), SpO2 99 %. Body mass index is  28.59 kg/m??.      O2 Device: BIPAP           Temp (24hrs), Avg:99.1 ??F (37.3 ??C), Min:99.1 ??F (37.3 ??C), Max:99.1 ??F (37.3 ??C)           Intake/Output:    Last shift:      No intake/output data recorded.   Last 3 shifts: No intake/output data recorded.   No intake or output data in the 24 hours ending 08/22/18 1713    Physical Exam   Vitals signs and nursing note reviewed.   HENT :       Head: Normocephalic and atraumatic.   Eyes :       Pupils: Pupils  are equal, round, and reactive to light.   Cardiovascular:       Rate and Rhythm: Regular rhythm.   Pulmonary :       Breath sounds: Wheezing present.   Abdominal :      General: Bowel sounds are normal.      Palpations: Abdomen is soft.    Skin:      General: Skin is warm and dry.   Neurological :       General: No focal deficit present.      Mental Status: She is alert and oriented to person, place, and time.                  Labs Reviewed:     Recent Results (from the past 24 hour(s))     CBC WITH AUTOMATED DIFF          Collection Time: 08/22/18  2:10 PM         Result  Value  Ref Range            WBC  12.3 (H)  4.0 - 11.0 1000/mm3       RBC  4.42  3.60 - 5.20 M/uL       HGB  12.7 (L)  13.0 - 17.2 gm/dl       HCT  39.0  37.0 - 50.0 %       MCV  88.2  80.0 - 98.0 fL       MCH  28.7  25.4 - 34.6 pg       MCHC  32.6  30.0 - 36.0 gm/dl       PLATELET  200  140 - 450 1000/mm3       MPV  11.3 (H)  6.0 - 10.0 fL       RDW-SD  44.4  36.4 - 46.3         NRBC  0  0 - 0         IMMATURE GRANULOCYTES  0.3  0.0 - 3.0 %  NEUTROPHILS  86.6 (H)  34 - 64 %       LYMPHOCYTES  6.1 (L)  28 - 48 %       MONOCYTES  6.3  1 - 13 %       EOSINOPHILS  0.4  0 - 5 %       BASOPHILS  0.3  0 - 3 %       METABOLIC PANEL, COMPREHENSIVE          Collection Time: 08/22/18  2:10 PM         Result  Value  Ref Range            Sodium  139  136 - 145 mEq/L       Potassium  3.9  3.5 - 5.1 mEq/L       Chloride  103  98 - 107 mEq/L       CO2  29  21 - 32 mEq/L       Glucose  95  74 - 106 mg/dl       BUN  19  7 - 25 mg/dl            Creatinine  1.0  0.6 - 1.3 mg/dl            GFR est AA  >60.0          GFR est non-AA  59          Calcium  9.2  8.5 - 10.1 mg/dl       AST (SGOT)  7 (L)  15 - 37 U/L       ALT (SGPT)  15  12 - 78 U/L       Alk. phosphatase  98  45 - 117 U/L       Bilirubin, total  0.2  0.2 - 1.0 mg/dl       Protein, total  7.7  6.4 - 8.2 gm/dl       Albumin  3.3 (L)  3.4 - 5.0 gm/dl       Anion gap  7  5 - 15 mmol/L       TROPONIN I           Collection Time: 08/22/18  2:10 PM         Result  Value  Ref Range            Troponin-I  <0.015  0.000 - 0.045 ng/ml       NT-PRO BNP          Collection Time: 08/22/18  2:10 PM         Result  Value  Ref Range            NT pro-BNP  18.0  0.0 - 125.0 pg/ml       POC BLOOD GAS + LACTIC ACID          Collection Time: 08/22/18  2:40 PM         Result  Value  Ref Range            pH  7.400  7.350 - 7.450         PCO2  44.1  35.0 - 45.0 mm Hg       PO2  82.0  75 - 100 mm Hg       BICARBONATE  27.4 (H)  18.0 - 26.0 mmol/L       O2 SAT  96.0  90 - 100 %  CO2, TOTAL  29.0  24 - 29 mmol/L       BASE EXCESS  3  -2 - 3 mmol/L       Patient temp.  98.6 F          Sample type  Art          FIO2  30          SITE  R Radial          DEVICE  BiPAP          ALLENS TEST  Pass          IPAP/PIP  15          EPAP/CPAP/PEEP  8          Respiratory Rate  24          SET RATE  8          VTEXP  556          INFLUENZA A/B, BY PCR          Collection Time: 08/22/18  3:10 PM         Result  Value  Ref Range            Influenza A PCR  NEGATIVE  NEGATIVE              Influenza B PCR  NEGATIVE  NEGATIVE                No results for input(s): FIO2I, IFO2, HCO3I, IHCO3, HCOPOC, PCO2I, PCOPOC, IPHI, PHI, PHPOC, PO2I, PO2POC in the last 72 hours.      No lab exists for component: IPOC2        All Micro Results               Procedure  Component  Value  Units  Date/Time           CULTURE, RESPIRATORY/SPUTUM/BRONCH Verdie Drown [[id number]]              Order Status:  No result  Specimen:  Sputum                         Imaging:   '[x]'  I have personally reviewed the patients chest radiographs images and report  with the patient     Results from Hospital Encounter encounter on 08/22/18     XR CHEST SNGL V           Narrative  Clinical history: Shortness of breath      EXAMINATION:   Single view of the chest 08/22/2018      Correlation: Chest radiograph 08/04/2018 and CTA chest 02/18/2018      FINDINGS:   Trachea and heart size are within  normal limits. Scarring/atelectasis left mid   to lower lung. 2 cm left lower lobe nodule unchanged from chest radiograph of   08/04/2018 and CTA chest 02/18/2018. Right lung is clear.              Impression  IMPRESSION:   1. No acute pulmonary process.   2. 2 cm stable nodule left lower lobe unchanged from 08/04/2018 and evident on CT   scan 02/18/2018.                Results from [place] encounter on 05/11/18     CT ABD PELV W CONT           Narrative  INDICATION: Abdominal  pain      COMPARISON: CT dated 02/22/2018      TECHNIQUE:    CT of the abdomen and pelvis WITH intravenous contrast. DICOM format image data   is available to non-affiliated external healthcare facilities or entities on a   secure, media free, reciprocally searchable basis with patient authorization for   12 months following the date of the study.      Findings:      Scattered emphysema, most pronounced within the right middle lobe. Left lung   base subsegmental atelectasis or scarring.      Liver, gallbladder, and pancreas grossly unremarkable. Mild thickening of the   adrenal glands. Scattered indeterminant splenic hypodensities, measuring up to   1.6 cm. Findings may reflect small pseudocysts or hemangioma. Too small to   characterize bilateral renal hypodensities and a 1 cm right renal cyst.   Nonobstructing 2 mm right renal calculus.      Extensive sigmoid colonic diverticulosis. There is suggestion of very subtle   inflammatory change surrounding a short segment of the sigmoid colon, concerning   for very mild/early acute diverticulitis. The splenic flexure appears thickened,   which may be related to decompression. A normal appendix.      Patient appears to be status post umbilical and supraumbilical ventral abdominal   wall herniorrhaphy. There is prominent 3.2 x 1.6 cm stranding situated within   the subcutaneous tissues of the previous supraumbilical ventral abdominal wall   hernia, with edema and inflammation extending to the  underlying intra-abdominal   fat/greater omentum. Tiny pockets of fluid (less than 1 cm) are noted along the   ventral aspect of the left hepatic lobe, interposed between the liver and the   anterior abdominal wall (image 16 on series 2). A small 1.3 cm nodular density   within the left upper quadrant, involving the greater omentum (image 21).      Hysterectomy.      Right hip arthroplasty.              Impression  IMPRESSION:   1. Patient appears to be status post umbilical and supraumbilical ventral   abdominal wall herniorrhaphy. Prominent subcutaneous inflammatory change   overlying the previous supraumbilical hernia. This may reflect inflammation,   phlegmon or fat necrosis. No obvious abscess.   2. Intra-abdominal inflammation deep to the a forementioned subcutaneous   supraumbilical hernia repair, involving the greater omentum with tiny locules of   fluid abutting the anterior surface of the left hepatic lobe. This likely   reflects postoperative changes. Developing infectious process cannot be   excluded. No drainable fluid collection or abscess.   3. Findings which may reflect a very mild/early diverticulitis of the sigmoid   colon. Clinical correlation is recommended.   4. Nonobstructing right renal calculus.   5. Emphysematous changes.   6. Stable indeterminate splenic hypodensities. These may reflect tiny   pseudocysts or hemangioma. Follow-up CT in 12 months to ensure stability.                '[x]'  See my orders for details      My assessment, plan  of care, findings, medications, side effects etc were discussed with:      '[x]'  nursing  '[]'  PT/OT      '[x]'  respiratory therapy  '[]'  Dr.     '[]'  family  '[]'  Patient        '[x]'  Total care time exclusive of procedures 27 m inutes with complex decision making  performed and > 50% time spent in face to face consultation.       Donavan Foil, MD

## 2018-08-22 NOTE — Progress Notes (Signed)
Code R Consult Received.    After Hours Consult:      No    Last admission dc date and dx      08/08/2018     COPD    What was the discharge plan    home    Now presents      SOB    Case Discussed with ED Physician:   N    Case Discussed with Attending Physician:    N    Appropriate for Admission:      Yes Inpatient     Anticipated Discharge Needs:   home    Disposition if discharged from the ED         Readmission interview done (epic)     N    Readmission assessment completed (compare AD)   Mikael Spray complete Y

## 2018-08-22 NOTE — ED Notes (Signed)
Patient brought in by ems in respiratory failure from home, patient has hx of asthma and COPD and reports her breathing got worse this morning despite taking her meds, was given breathing treatment by ems enroute, patient is alert and oriented

## 2018-08-22 NOTE — ED Notes (Signed)
 TRANSFER - OUT REPORT:    Verbal report given on [name] [name]  being transferred to 3 west(unit) for routine progression of care       Report consisted of patient's Situation, Background, Assessment and   Recommendations(SBAR).     Information from the following report(s) Kardex, ED Summary, MAR and Recent Results was reviewed with the receiving nurse.    Lines:   Peripheral IV 08/22/18 Right Forearm (Active)   Site Assessment Clean, dry, & intact 08/22/2018  2:10 PM   Phlebitis Assessment 0 08/22/2018  2:10 PM   Infiltration Assessment 0 08/22/2018  2:10 PM   Dressing Status Clean, dry, & intact 08/22/2018  2:10 PM   Dressing Type Transparent 08/22/2018  2:10 PM        Opportunity for questions and clarification was provided.      Patient transported with:RN and on BIPAP

## 2018-08-23 LAB — BASIC METABOLIC PANEL
Anion Gap: 7 mmol/L (ref 5–15)
BUN: 20 mg/dl (ref 7–25)
CO2: 27 mEq/L (ref 21–32)
Calcium: 9.3 mg/dl (ref 8.5–10.1)
Chloride: 106 mEq/L (ref 98–107)
Creatinine: 0.9 mg/dl (ref 0.6–1.3)
EGFR IF NonAfrican American: >60
GFR African American: >60
Glucose: 172 mg/dl — ABNORMAL HIGH (ref 74–106)
Potassium: 4.5 mEq/L (ref 3.5–5.1)
Sodium: 140 mEq/L (ref 136–145)

## 2018-08-23 LAB — CBC WITH AUTO DIFFERENTIAL
Basophils %: 0 % (ref 0–3)
Eosinophils %: 0 % (ref 0–5)
Hematocrit: 38.9 % (ref 37.0–50.0)
Hemoglobin: 12.4 gm/dl — ABNORMAL LOW (ref 13.0–17.2)
Immature Granulocytes: 0.3 % (ref 0.0–3.0)
Lymphocytes %: 7.6 % — ABNORMAL LOW (ref 28–48)
MCH: 28.8 pg (ref 25.4–34.6)
MCHC: 31.9 gm/dl (ref 30.0–36.0)
MCV: 90.3 fL (ref 80.0–98.0)
MPV: 11.4 fL — ABNORMAL HIGH (ref 6.0–10.0)
Monocytes %: 1.6 % (ref 1–13)
Neutrophils %: 90.5 % — ABNORMAL HIGH (ref 34–64)
Nucleated RBCs: 0 (ref 0–0)
Platelets: 178 10*3/uL (ref 140–450)
RBC: 4.31 M/uL (ref 3.60–5.20)
RDW-SD: 44.5 (ref 36.4–46.3)
WBC: 5.8 10*3/uL (ref 4.0–11.0)

## 2018-08-23 LAB — POCT GLUCOSE
POC Glucose: 300 mg/dL — ABNORMAL HIGH (ref 65–105)
POC Glucose: 218 mg/dL — ABNORMAL HIGH (ref 65–105)
POC Glucose: 209 mg/dL — ABNORMAL HIGH (ref 65–105)
POC Glucose: 120 mg/dL — ABNORMAL HIGH (ref 65–105)
POC Glucose: 167 mg/dL — ABNORMAL HIGH (ref 65–105)

## 2018-08-23 LAB — TROPONIN: Troponin I: <0.015 ng/ml (ref 0.000–0.045)

## 2018-08-23 LAB — CBC WITH AUTOMATED DIFF
BASOPHILS: 0 % (ref 0–3)
EOSINOPHILS: 0 % (ref 0–5)
HCT: 38.9 % (ref 37.0–50.0)
HGB: 12.4 gm/dl — ABNORMAL LOW (ref 13.0–17.2)
IMMATURE GRANULOCYTES: 0.3 % (ref 0.0–3.0)
LYMPHOCYTES: 7.6 % — ABNORMAL LOW (ref 28–48)
MCH: 28.8 pg (ref 25.4–34.6)
MCHC: 31.9 gm/dl (ref 30.0–36.0)
MCV: 90.3 fL (ref 80.0–98.0)
MONOCYTES: 1.6 % (ref 1–13)
MPV: 11.4 fL — ABNORMAL HIGH (ref 6.0–10.0)
NEUTROPHILS: 90.5 % — ABNORMAL HIGH (ref 34–64)
NRBC: 0 (ref 0–0)
PLATELET: 178 10*3/uL (ref 140–450)
RBC: 4.31 M/uL (ref 3.60–5.20)
RDW-SD: 44.5 (ref 36.4–46.3)
WBC: 5.8 10*3/uL (ref 4.0–11.0)

## 2018-08-23 LAB — METABOLIC PANEL, BASIC
Anion gap: 7 mmol/L (ref 5–15)
BUN: 20 mg/dl (ref 7–25)
CO2: 27 mEq/L (ref 21–32)
Calcium: 9.3 mg/dl (ref 8.5–10.1)
Chloride: 106 mEq/L (ref 98–107)
Creatinine: 0.9 mg/dl (ref 0.6–1.3)
GFR est AA: >60
GFR est non-AA: >60
Glucose: 172 mg/dl — ABNORMAL HIGH (ref 74–106)
Potassium: 4.5 mEq/L (ref 3.5–5.1)
Sodium: 140 mEq/L (ref 136–145)

## 2018-08-23 LAB — GLUCOSE, POC
Glucose (POC): 300 mg/dL — ABNORMAL HIGH (ref 65–105)
Glucose (POC): 218 mg/dL — ABNORMAL HIGH (ref 65–105)
Glucose (POC): 209 mg/dL — ABNORMAL HIGH (ref 65–105)
Glucose (POC): 120 mg/dL — ABNORMAL HIGH (ref 65–105)
Glucose (POC): 167 mg/dL — ABNORMAL HIGH (ref 65–105)

## 2018-08-23 LAB — TROPONIN I: Troponin-I: <0.015 ng/ml (ref 0.000–0.045)

## 2018-08-23 MED ORDER — DEXTROMETHORPHAN POLY COMPLEX SR 30 MG/5 ML ORAL 12 HR SUSP
30 mg/5 mL | Freq: Two times a day (BID) | ORAL | Status: DC | PRN
Start: 2018-08-23 — End: 2018-09-07
  Administered 2018-08-23 – 2018-08-26 (×3): via ORAL

## 2018-08-23 MED ORDER — INSULIN LISPRO 100 UNIT/ML INJECTION
100 unit/mL | SUBCUTANEOUS | Status: DC | PRN
Start: 2018-08-23 — End: 2018-09-07
  Administered 2018-08-23 – 2018-09-06 (×37): via SUBCUTANEOUS

## 2018-08-23 MED ORDER — IPRATROPIUM-ALBUTEROL 2.5 MG-0.5 MG/3 ML NEB SOLUTION
2.5 mg-0.5 mg/3 ml | RESPIRATORY_TRACT | Status: DC
Start: 2018-08-23 — End: 2018-08-24
  Administered 2018-08-23 – 2018-08-24 (×11): via RESPIRATORY_TRACT

## 2018-08-23 MED ORDER — GLUCAGON 1 MG INJECTION
1 mg | INTRAMUSCULAR | Status: DC | PRN
Start: 2018-08-23 — End: 2018-09-07

## 2018-08-23 MED ORDER — KETOROLAC TROMETHAMINE 15 MG/ML INJECTION
15 mg/mL | Freq: Once | INTRAMUSCULAR | Status: AC
Start: 2018-08-23 — End: 2018-08-23
  Administered 2018-08-23: 15:00:00 via INTRAVENOUS

## 2018-08-23 MED ORDER — INSULIN LISPRO 100 UNIT/ML INJECTION
100 unit/mL | Freq: Four times a day (QID) | SUBCUTANEOUS | Status: DC
Start: 2018-08-23 — End: 2018-09-07
  Administered 2018-08-23 – 2018-09-07 (×52): via SUBCUTANEOUS

## 2018-08-23 MED ORDER — DEXTROSE 50% IN WATER (D50W) IV SYRG
INTRAVENOUS | Status: DC | PRN
Start: 2018-08-23 — End: 2018-09-07

## 2018-08-23 MED ORDER — REVEFENACIN 175 MCG/3 ML SOLUTION FOR NEBULIZATION
175 mcg/3 mL | Freq: Every day | RESPIRATORY_TRACT | Status: DC
Start: 2018-08-23 — End: 2018-09-07
  Administered 2018-08-24 – 2018-09-07 (×14): via RESPIRATORY_TRACT

## 2018-08-23 MED ORDER — INSULIN GLARGINE 100 UNIT/ML INJECTION
100 unit/mL | Freq: Every evening | SUBCUTANEOUS | Status: DC
Start: 2018-08-23 — End: 2018-09-07
  Administered 2018-08-23 – 2018-09-07 (×16): via SUBCUTANEOUS

## 2018-08-23 MED FILL — BROVANA 15 MCG/2 ML SOLUTION FOR NEBULIZATION: 15 mcg/2 mL | RESPIRATORY_TRACT | Qty: 1

## 2018-08-23 MED FILL — AZITHROMYCIN 500 MG IV SOLUTION: 500 mg | INTRAVENOUS | Qty: 5

## 2018-08-23 MED FILL — IPRATROPIUM-ALBUTEROL 2.5 MG-0.5 MG/3 ML NEB SOLUTION: 2.5 mg-0.5 mg/3 ml | RESPIRATORY_TRACT | Qty: 3

## 2018-08-23 MED FILL — HEPARIN (PORCINE) 5,000 UNIT/ML IJ SOLN: 5000 unit/mL | INTRAMUSCULAR | Qty: 1

## 2018-08-23 MED FILL — MONTELUKAST 10 MG TAB: 10 mg | ORAL | Qty: 1

## 2018-08-23 MED FILL — GABAPENTIN 300 MG CAP: 300 mg | ORAL | Qty: 1

## 2018-08-23 MED FILL — INSULIN LISPRO 100 UNIT/ML INJECTION: 100 unit/mL | SUBCUTANEOUS | Qty: 1

## 2018-08-23 MED FILL — SOLU-MEDROL (PF) 40 MG/ML SOLUTION FOR INJECTION: 40 mg/mL | INTRAMUSCULAR | Qty: 1

## 2018-08-23 MED FILL — BUDESONIDE 0.5 MG/2 ML NEB SUSPENSION: 0.5 mg/2 mL | RESPIRATORY_TRACT | Qty: 1

## 2018-08-23 MED FILL — YUPELRI 175 MCG/3 ML SOLUTION FOR NEBULIZATION: 175 mcg/3 mL | RESPIRATORY_TRACT | Qty: 1

## 2018-08-23 MED FILL — TYLENOL 325 MG TABLET: 325 mg | ORAL | Qty: 2

## 2018-08-23 MED FILL — HYDROCODONE-ACETAMINOPHEN 5 MG-325 MG TAB: 5-325 mg | ORAL | Qty: 1

## 2018-08-23 MED FILL — DELSYM 12 HOUR 30 MG/5 ML ORAL SUSPENSION,EXTENDED RELEASE: 30 mg/5 mL | ORAL | Qty: 5

## 2018-08-23 MED FILL — DILTIAZEM ER 180 MG 24 HR CAP: 180 mg | ORAL | Qty: 1

## 2018-08-23 MED FILL — KETOROLAC TROMETHAMINE 15 MG/ML INJECTION: 15 mg/mL | INTRAMUSCULAR | Qty: 1

## 2018-08-23 NOTE — Progress Notes (Signed)
Hospitalist Progress Note         [facility]    Daily Progress Note: 08/23/2018    Assessment/Plan:   1.  Acute hypoxic respiratory failure, POA.  2.  Acute exacerbation COPD.  3.  Acute bronchitis  4.  HTN  5.  T2DM  6.  Left upper lobe lung nodule, will need continued follow-up   7.  Dyslipidemia  8.  PTSD, she was in [employer] on 9[place]11, has had respiratory problems since.  Smoked very briefly in the distant past  9.  Peripheral neuropathy  10.  GERD.    11.  DJD  12.  DVT proph w sc Heparin  Subjective:   Still SOB & wheezing. Cough w white sputum. No C/P.       Cardiovascular ROS: no chest pain ,  orthopnea  Gastrointestinal ROS: no abdominal pain, nausea,vomiting, diarrhea, melena or hematechezia  Genito-Urinary ROS: no dysuria, trouble voiding, or hematuria  Objective:   Physical Exam:     Visit Vitals  BP (!) 132/111   Pulse 96   Temp 98.2 ??F (36.8 ??C)   Resp 16   Ht 5' 8"  (1.727 m)   Wt 85.3 kg (188 lb)   SpO2 98%   BMI 28.59 kg/m??    O2 Flow Rate (L/min): 3 l/min O2 Device: Nasal cannula    Temp (24hrs), Avg:98.1 ??F (36.7 ??C), Min:97.6 ??F (36.4 ??C), Max:99.1 ??F (37.3 ??C)    No intake/output data recorded.   03/10 1901 - 03/12 0700  In: -   Out: 650 [Urine:650]    General:  Alert, cooperative, no distress, appears stated age.   Lungs:   Diffuse exp wheezes    :     Heart:  Regular rate and rhythm, S1, S2 normal, no murmur, click, rub or gallop.   Abdomen:   Soft, non-tender. Bowel sounds normal. No masses,  No organomegaly.   Extremities:  FROM, no LE edema   Pulses: 2+ and symmetric all extremities.   Skin: Skin color, texture, turgor normal. No rashes or lesions   :      Data Review:       24 Hour Results:  Recent Results (from the past 24 hour(s))   CBC WITH AUTOMATED DIFF    Collection Time: 08/22/18  2:10 PM   Result Value Ref Range    WBC 12.3 (H) 4.0 - 11.0 1000/mm3    RBC 4.42 3.60 - 5.20 M/uL    HGB 12.7 (L) 13.0 - 17.2 gm/dl    HCT 39.0 37.0 - 50.0 %    MCV 88.2 80.0 - 98.0 fL     MCH 28.7 25.4 - 34.6 pg    MCHC 32.6 30.0 - 36.0 gm/dl    PLATELET 200 140 - 450 1000/mm3    MPV 11.3 (H) 6.0 - 10.0 fL    RDW-SD 44.4 36.4 - 46.3      NRBC 0 0 - 0      IMMATURE GRANULOCYTES 0.3 0.0 - 3.0 %    NEUTROPHILS 86.6 (H) 34 - 64 %    LYMPHOCYTES 6.1 (L) 28 - 48 %    MONOCYTES 6.3 1 - 13 %    EOSINOPHILS 0.4 0 - 5 %    BASOPHILS 0.3 0 - 3 %   METABOLIC PANEL, COMPREHENSIVE    Collection Time: 08/22/18  2:10 PM   Result Value Ref Range    Sodium 139 136 - 145 mEq/L    Potassium  3.9 3.5 - 5.1 mEq/L    Chloride 103 98 - 107 mEq/L    CO2 29 21 - 32 mEq/L    Glucose 95 74 - 106 mg/dl    BUN 19 7 - 25 mg/dl    Creatinine 1.0 0.6 - 1.3 mg/dl    GFR est AA >60.0      GFR est non-AA 59      Calcium 9.2 8.5 - 10.1 mg/dl    AST (SGOT) 7 (L) 15 - 37 U/L    ALT (SGPT) 15 12 - 78 U/L    Alk. phosphatase 98 45 - 117 U/L    Bilirubin, total 0.2 0.2 - 1.0 mg/dl    Protein, total 7.7 6.4 - 8.2 gm/dl    Albumin 3.3 (L) 3.4 - 5.0 gm/dl    Anion gap 7 5 - 15 mmol/L   TROPONIN I    Collection Time: 08/22/18  2:10 PM   Result Value Ref Range    Troponin-I <0.015 0.000 - 0.045 ng/ml   NT-PRO BNP    Collection Time: 08/22/18  2:10 PM   Result Value Ref Range    NT pro-BNP 18.0 0.0 - 125.0 pg/ml   POC BLOOD GAS + LACTIC ACID    Collection Time: 08/22/18  2:40 PM   Result Value Ref Range    pH 7.400 7.350 - 7.450      PCO2 44.1 35.0 - 45.0 mm Hg    PO2 82.0 75 - 100 mm Hg    BICARBONATE 27.4 (H) 18.0 - 26.0 mmol/L    O2 SAT 96.0 90 - 100 %    CO2, TOTAL 29.0 24 - 29 mmol/L    BASE EXCESS 3 -2 - 3 mmol/L    Patient temp. 98.6 F      Sample type Art      FIO2 30      SITE R Radial      DEVICE BiPAP      ALLENS TEST Pass      IPAP/PIP 15      EPAP/CPAP/PEEP 8      Respiratory Rate 24      SET RATE 8      VTEXP 556     INFLUENZA A/B, BY PCR    Collection Time: 08/22/18  3:10 PM   Result Value Ref Range    Influenza A PCR NEGATIVE NEGATIVE      Influenza B PCR NEGATIVE NEGATIVE     TROPONIN I    Collection Time: 08/22/18  6:31 PM    Result Value Ref Range    Troponin-I <0.015 0.000 - 0.045 ng/ml   GLUCOSE, POC    Collection Time: 08/22/18  9:04 PM   Result Value Ref Range    Glucose (POC) 300 (H) 65 - 105 mg/dL   TROPONIN I    Collection Time: 08/22/18 11:28 PM   Result Value Ref Range    Troponin-I <0.015 0.000 - 0.045 ng/ml   GLUCOSE, POC    Collection Time: 08/23/18 12:09 AM   Result Value Ref Range    Glucose (POC) 218 (H) 65 - 105 mg/dL   CBC WITH AUTOMATED DIFF    Collection Time: 08/23/18  5:19 AM   Result Value Ref Range    WBC 5.8 4.0 - 11.0 1000/mm3    RBC 4.31 3.60 - 5.20 M/uL    HGB 12.4 (L) 13.0 - 17.2 gm/dl    HCT 38.9 37.0 - 50.0 %    MCV  90.3 80.0 - 98.0 fL    MCH 28.8 25.4 - 34.6 pg    MCHC 31.9 30.0 - 36.0 gm/dl    PLATELET 178 140 - 450 1000/mm3    MPV 11.4 (H) 6.0 - 10.0 fL    RDW-SD 44.5 36.4 - 46.3      NRBC 0 0 - 0      IMMATURE GRANULOCYTES 0.3 0.0 - 3.0 %    NEUTROPHILS 90.5 (H) 34 - 64 %    LYMPHOCYTES 7.6 (L) 28 - 48 %    MONOCYTES 1.6 1 - 13 %    EOSINOPHILS 0.0 0 - 5 %    BASOPHILS 0.0 0 - 3 %   METABOLIC PANEL, BASIC    Collection Time: 08/23/18  5:19 AM   Result Value Ref Range    Sodium 140 136 - 145 mEq/L    Potassium 4.5 3.5 - 5.1 mEq/L    Chloride 106 98 - 107 mEq/L    CO2 27 21 - 32 mEq/L    Glucose 172 (H) 74 - 106 mg/dl    BUN 20 7 - 25 mg/dl    Creatinine 0.9 0.6 - 1.3 mg/dl    GFR est AA >60.0      GFR est non-AA >60      Calcium 9.3 8.5 - 10.1 mg/dl    Anion gap 7 5 - 15 mmol/L   GLUCOSE, POC    Collection Time: 08/23/18  8:22 AM   Result Value Ref Range    Glucose (POC) 209 (H) 65 - 105 mg/dL       Problem List:  Problem List as of 08/23/2018 Date Reviewed: September 05, 2018          Codes Class Noted - Resolved    Respiratory failure with hypoxia [facility]) ICD-10-CM: J96.91  ICD-9-CM: 518.81  08/22/2018 - Present        Headache ICD-10-CM: R51  ICD-9-CM: 784.0  08/05/2018 - Present        Abdominal pain ICD-10-CM: R10.9  ICD-9-CM: 789.00  02/04/2018 - Present         SIRS (systemic inflammatory response syndrome) (HCC) ICD-10-CM: R65.10  ICD-9-CM: 995.90  10/09/2017 - Present        Hyperkalemia ICD-10-CM: E87.5  ICD-9-CM: 276.7  09/08/2017 - Present        Obtunded ICD-10-CM: R40.1  ICD-9-CM: 780.09  09/08/2017 - Present        Acute renal failure (ARF) (HCC) ICD-10-CM: N17.9  ICD-9-CM: 584.9  09/08/2017 - Present        Septic shock (HCC) ICD-10-CM: A41.9, R65.21  ICD-9-CM: 038.9, 785.52, 995.92  09/08/2017 - Present        Metabolic encephalopathy CWC-37-SE: G93.41  ICD-9-CM: 348.31  09/08/2017 - Present        Dehydration ICD-10-CM: E86.0  ICD-9-CM: 276.51  08/07/2017 - Present        COPD with acute exacerbation (HCC) ICD-10-CM: J44.1  ICD-9-CM: 491.21  08/07/2017 - Present        Acute-on-chronic kidney injury ([place]) ICD-10-CM: N17.9, N18.9  ICD-9-CM: 584.9, 585.9  08/07/2017 - Present        Chest pain ICD-10-CM: R07.9  ICD-9-CM: 786.50  06/15/2017 - Present        Dyspnea ICD-10-CM: R06.00  ICD-9-CM: 786.09  06/15/2017 - Present        Type 2 diabetes mellitus with diabetic neuropathy (HCC) ICD-10-CM: E11.40  ICD-9-CM: 250.60, 357.2  12/07/2016 - Present        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  ICD-9-CM: 569.89  08/17/2016 - Present        Cannabinoid hyperemesis syndrome (HCC) ICD-10-CM: F12.988  ICD-9-CM: 536.2, 305.20  08/17/2016 - Present        Gastritis ICD-10-CM: K29.70  ICD-9-CM: 535.50  08/16/2016 - Present        Nausea & vomiting ICD-10-CM: R11.2  ICD-9-CM: 787.01  08/16/2016 - Present        Asthma with acute exacerbation ICD-10-CM: J45.901  ICD-9-CM: 493.92  08/04/2016 - Present        Lactic acidosis ICD-10-CM: E87.2  ICD-9-CM: 276.2  07/24/2016 - Present        Leukocytosis ICD-10-CM: D72.829  ICD-9-CM: 288.60  07/24/2016 - Present        Sepsis ([place]) ICD-10-CM: A41.9  ICD-9-CM: 038.9, 995.91  07/24/2016 - Present        Asthma exacerbation ICD-10-CM: J45.901  ICD-9-CM: 493.92  07/24/2016 - Present        Type 2 diabetes mellitus with nephropathy ([place]) ICD-10-CM: E11.21   ICD-9-CM: 250.40, 583.81  06/12/2016 - Present        Sacroiliitis ([place]) ICD-10-CM: M46.1  ICD-9-CM: 720.2  11/25/2015 - Present        Essential hypertension ICD-10-CM: I10  ICD-9-CM: 401.9  10/06/2015 - Present        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  ICD-9-CM: 721.3  09/15/2015 - Present        Lumbar and sacral osteoarthritis ICD-10-CM: M47.817  ICD-9-CM: 721.3  09/15/2015 - Present        Chronic pain syndrome ICD-10-CM: G89.4  ICD-9-CM: 338.4  09/15/2015 - Present        Type 2 diabetes mellitus with hyperglycemia, without long-term current use of insulin ([place]) ICD-10-CM: E11.65  ICD-9-CM: 250.00, 790.29  08/20/2015 - Present        Acute colitis ICD-10-CM: K52.9  ICD-9-CM: 558.9  07/02/2015 - Present        Acute hyperglycemia ICD-10-CM: R73.9  ICD-9-CM: 790.29  07/02/2015 - Present        Accelerated hypertension ICD-10-CM: I10  ICD-9-CM: 401.0  04/08/2015 - Present        Severe headache ICD-10-CM: R51  ICD-9-CM: 784.0  04/07/2015 - Present        Osteoarthritis of hips, 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  ICD-9-CM: 715.95  01/29/2015 - Present        PTSD (post-traumatic stress disorder) (Chronic) ICD-10-CM: F43.10  ICD-9-CM: 309.81  01/29/2015 - Present        Severe hypertension (Chronic) ICD-10-CM: I10  ICD-9-CM: 401.9  07/21/2014 - Present        RESOLVED: COPD exacerbation ([place]) ICD-10-CM: J44.1  ICD-9-CM: 491.21  11/14/2017 - 08/08/2018        RESOLVED: New onset type 1 diabetes mellitus, uncontrolled ([place]) ICD-10-CM: E10.65  ICD-9-CM: 250.03  07/02/2015 - 10/06/2015        RESOLVED: Recurrent major depressive disorder, in full remission ([place]) ICD-10-CM: F33.42  ICD-9-CM: 296.36  12/02/2014 - 12/02/2014        RESOLVED: Foreign body in colon ICD-10-CM: T18.4XXA  ICD-9-CM: 034  08/14/2014 - 01/29/2015        RESOLVED: Advanced care planning/counseling discussion ICD-10-CM: Z71.89  ICD-9-CM: V65.49  07/21/2014 - 01/29/2015    Overview Signed 07/21/2014  3:46 PM by [place]      Patient given [place] application               RESOLVED: Hip pain, chronic ICD-10-CM:  M25.559, [id number]  ICD-9-CM: 719.45, 338.29  07/21/2014 - 01/29/2015               Medications reviewed  Current Facility-Administered Medications   Medication Dose Route Frequency   ??? [START ON 08/24/2018] revefenacin (YUPELRI) nebulizer solution 175 mcg  175 mcg Nebulization DAILY   ??? [Held by provider] cloNIDine HCL (CATAPRES) tablet 0.3 mg  0.3 mg Oral BID   ??? dilTIAZem CD (CARDIZEM CD) capsule 180 mg  180 mg Oral DAILY   ??? gabapentin (NEURONTIN) capsule 300 mg  300 mg Oral TID   ??? [Held by provider] losartan (COZAAR) tablet 12.5 mg  12.5 mg Oral DAILY   ??? montelukast (SINGULAIR) tablet 10 mg  10 mg Oral QHS   ??? naloxone (NARCAN) injection 0.1 mg  0.1 mg IntraVENous PRN   ??? acetaminophen (TYLENOL) tablet 650 mg  650 mg Oral Q4H PRN    Or   ??? acetaminophen (TYLENOL) solution 650 mg  650 mg Oral Q4H PRN    Or   ??? acetaminophen (TYLENOL) suppository 650 mg  650 mg Rectal Q4H PRN   ??? HYDROcodone-acetaminophen (NORCO) 5-325 mg per tablet 1 Tab  1 Tab Oral Q4H PRN   ??? ondansetron (ZOFRAN) injection 4 mg  4 mg IntraVENous Q4H PRN   ??? MAGNESIUM ELECTROLYTE REPLACEMENT PROTOCOL STANDARD DOSING  1 Each Other PRN   ??? POTASSIUM ELECTROLYTE REPLACEMENT PROTOCOL STANDARD DOSING  1 Each Other PRN   ??? CALCIUM ELECTROLYTE REPLACEMENT PROTOCOL STANDARD DOSING  1 Each Other PRN   ??? PHOSPHATE ELECTROLYTE REPLACEMENT PROTOCOL STANDARD DOSING  1 Each Other PRN   ??? heparin (porcine) injection 5,000 Units  5,000 Units SubCUTAneous Q8H   ??? methylPREDNISolone (PF) (SOLU-MEDROL) injection 40 mg  40 mg IntraVENous Q6H   ??? arformoteroL (BROVANA) neb solution 15 mcg  15 mcg Nebulization BID RT   ??? budesonide (PULMICORT) 500 mcg/2 ml nebulizer suspension  500 mcg Nebulization BID RT   ??? azithromycin (ZITHROMAX) 500 mg in 0.9% sodium chloride 250 mL IVPB  500 mg IntraVENous Q24H   ??? albuterol (PROVENTIL VENTOLIN) nebulizer solution 2.5 mg  2.5 mg  Nebulization Q2H PRN   ??? albuterol-ipratropium (DUO-NEB) 2.5 MG-0.5 MG/3 ML  3 mL Nebulization Q4H RT   ??? dextrose (D50W) injection syrg 5-25 g  10-50 mL IntraVENous PRN   ??? glucagon (GLUCAGEN) injection 1 mg  1 mg IntraMUSCular PRN   ??? insulin glargine (LANTUS) injection 1-100 Units  1-100 Units SubCUTAneous QHS   ??? insulin lispro (HUMALOG) injection 1-100 Units  1-100 Units SubCUTAneous AC&HS   ??? insulin lispro (HUMALOG) injection 1-100 Units  1-100 Units SubCUTAneous PRN        Care Plan discussed with: pt & RN    Total time spent with patient: 35 minutes.    Carlean Jews, MD  August 23, 2018  9:28 AM

## 2018-08-23 NOTE — Progress Notes (Signed)
Problem: Falls - Risk of  Goal: *Absence of Falls  Description: Document Schmid Fall Risk and appropriate interventions in the flowsheet.  Outcome: Progressing Towards Goal  Note: Fall Risk Interventions:  Mobility Interventions: Bed/chair exit alarm, Patient to call before getting OOB, PT Consult for mobility concerns, PT Consult for assist device competence, Utilize walker, cane, or other assistive device         Medication Interventions: Bed/chair exit alarm, Evaluate medications/consider consulting pharmacy, Patient to call before getting OOB, Teach patient to arise slowly    Elimination Interventions: Bed/chair exit alarm, Call light in reach, Toileting schedule/hourly rounds              Problem: Patient Education: Go to Patient Education Activity  Goal: Patient/Family Education  Outcome: Progressing Towards Goal

## 2018-08-23 NOTE — Progress Notes (Signed)
Problem: Gas Exchange - Impaired  Goal: *Absence of hypoxia  Outcome: Progressing Towards Goal     Problem: Patient Education: Go to Patient Education Activity  Goal: Patient/Family Education  Outcome: Progressing Towards Goal     Problem: Falls - Risk of  Goal: *Absence of Falls  Description: Document Schmid Fall Risk and appropriate interventions in the flowsheet.  Outcome: Progressing Towards Goal  Note: Fall Risk Interventions:  Mobility Interventions: Bed/chair exit alarm, Patient to call before getting OOB         Medication Interventions: Bed/chair exit alarm, Patient to call before getting OOB, Teach patient to arise slowly    Elimination Interventions: Bed/chair exit alarm, Call light in reach, Patient to call for help with toileting needs

## 2018-08-23 NOTE — Progress Notes (Signed)
[facility]     Name: [name] [name] MRN: [id number]   DOB: 1951-01-26 Hospital: [facility]   Date: 08/23/2018        IMPRESSION:   ?? Worsening shortness of breath, shortness of breath, and wheezing due to COPD with exacerbation  ?? COPD with exacerbation  ?? GERD  ?? PTSD      PLAN:   ?? Continue BiPAP with current settings (15/8/30%) on regular basis during night time, and as needed basis during daytime,.  Titrate FiO2, and target oxygen saturation greater than 88%.  ?? Continue  Brovana and Pulmicort per nebulizer twice a day.  Continue  Yupelri through nebulizer once a day.  ?? Continue Solu-Medrol 40 mg IV every 6 hours for now. If remains stable, switch to PO prednisone from tomorrow.  Continue Singulair once a day.  ?? Continue azithromycin for acute bronchitis.  Send a sputum Gram stain and culture  ?? Continue subcu heparin for DVT prophylaxis     Subjective/Interval History:         Review of Systems   Constitutional: Negative.    HENT: Negative.    Eyes: Negative.    Respiratory: Positive for cough.    Cardiovascular: Negative.    Gastrointestinal: Negative.    Genitourinary: Negative.    Musculoskeletal: Negative.    Skin: Negative.    Neurological: Negative.    Endo/Heme/Allergies: Negative.    Psychiatric/Behavioral: Negative.        Objective:   Vital Signs:    Visit Vitals  BP 134/83   Pulse (!) 104   Temp 97.9 ??F (36.6 ??C)   Resp 16   Ht 5\' 8"  (1.727 m)   Wt 85.3 kg (188 lb)   SpO2 100%   BMI 28.59 kg/m??       O2 Device: Nasal cannula   O2 Flow Rate (L/min): 3 l/min   Temp (24hrs), Avg:97.9 ??F (36.6 ??C), Min:97.6 ??F (36.4 ??C), Max:98.3 ??F (36.8 ??C)       Intake/Output:   Last shift:      03/12 0701 - 03/12 1900  In: -   Out: 525 [Urine:525]  Last 3 shifts: 03/10 1901 - 03/12 0700  In: -   Out: 650 [Urine:650]    Intake/Output Summary (Last 24 hours) at 08/23/2018 1422  Last data filed at 08/23/2018 1125  Gross per 24 hour   Intake ???   Output 1175 ml    Net -1175 ml        Physical Exam  Vitals signs and nursing note reviewed.   Constitutional:       Appearance: Normal appearance.   HENT:      Head: Normocephalic and atraumatic.      Mouth/Throat:      Mouth: Mucous membranes are moist.   Cardiovascular:      Rate and Rhythm: Normal rate and regular rhythm.   Pulmonary:      Effort: Pulmonary effort is normal.      Breath sounds: Wheezing present.   Abdominal:      General: Bowel sounds are normal.      Tenderness: There is no abdominal tenderness.   Musculoskeletal:         General: No swelling.   Skin:     General: Skin is warm and dry.   Neurological:      General: No focal deficit present.      Mental Status: She is alert and oriented to person,  place, and time.   Psychiatric:         Mood and Affect: Mood normal.             DATA:  Labs:  Recent Labs     08/23/18  0519 08/22/18  1410   WBC 5.8 12.3*   HGB 12.4* 12.7*   HCT 38.9 39.0   PLT 178 200     Recent Labs     08/23/18  0519 08/22/18  1440 08/22/18  1410   NA 140  --  139   K 4.5  --  3.9   CL 106  --  103   CO2 27 29.0 29   GLU 172*  --  95   BUN 20  --  19   CREA 0.9  --  1.0   CA 9.3  --  9.2   ALB  --   --  3.3*   SGOT  --   --  7*   ALT  --   --  15     Recent Labs     08/22/18  1440   PH 7.400   PCO2 44.1   PO2 82.0   HCO3 27.4*   FIO2 30          Results     Procedure Component Value Units Date/Time    CULTURE, RESPIRATORY/SPUTUM/BRONCH Berniece Andreas [[id number]]     Order Status:  No result Specimen:  Sputum         XR Results (most recent):  Results from Hospital Encounter encounter on 08/22/18   XR CHEST SNGL V    Narrative Clinical history: Shortness of breath    EXAMINATION:  Single view of the chest 08/22/2018    Correlation: Chest radiograph 08/04/2018 and CTA chest 02/18/2018    FINDINGS:  Trachea and heart size are within normal limits. Scarring/atelectasis left mid  to lower lung. 2 cm left lower lobe nodule unchanged from chest radiograph of   08/04/2018 and CTA chest 02/18/2018. Right lung is clear.      Impression IMPRESSION:  1. No acute pulmonary process.  2. 2 cm stable nodule left lower lobe unchanged from 08/04/2018 and evident on CT  scan 02/18/2018.           Imaging:  [x] I have personally reviewed the patient???s radiographs  [] Radiographs reviewed with radiologist   [] No change from prior, tubes and lines in adequate position  [] Improved   [] Worsening        Verlan Friends, MD

## 2018-08-23 NOTE — Other (Signed)
Bedside and Verbal shift change report given to Barbara, RN (oncoming nurse) by Milagros M Jimenez, RN   (offgoing nurse). Report included the following information SBAR, Kardex, Recent Results and Cardiac Rhythm NSR.

## 2018-08-23 NOTE — Progress Notes (Signed)
Nutrition note:       Upon admission, pt was screened and identified by nursing staff as being at nutritional risk. Upon further review by the RD, there is no indication that this pt is at nutritional risk or requires further nutrition intervention at this time.     Pt denies N/V, abd pain, constipation/diarrhea. Denies chewing/swallowing issues. NKFA. Reports good appetite/PO intake currently and PTA.    Will sign off and re-evaluate pt if/when LOS parameters are met. Thank you.    Lucrezia Starch, RD  08/23/18

## 2018-08-23 NOTE — Progress Notes (Signed)
Problem: Gas Exchange - Impaired  Goal: *Absence of hypoxia  Outcome: Progressing Towards Goal     Problem: Patient Education: Go to Patient Education Activity  Goal: Patient/Family Education  Outcome: Progressing Towards Goal

## 2018-08-23 NOTE — Progress Notes (Signed)
Hospitalist Progress Note         [facility]    Daily Progress Note: 08/23/2018    Assessment/Plan:   1.  Acute hypoxic respiratory failure, POA.  2.  Acute exacerbation COPD.  3.  Acute bronchitis  4.  HTN  5.  T2DM  6.  Left upper lobe lung nodule, will need continued follow-up   7.  Dyslipidemia  8.  PTSD, she was in [employer] on 9[place]11, has had respiratory problems since.  Smoked very briefly in the distant past  9.  Peripheral neuropathy  10.  GERD.    11.  DJD  12.  DVT proph w sc Heparin  Subjective:   Still SOB & wheezing. Cough w white sputum. No C/P.       Cardiovascular ROS: no chest pain ,  orthopnea  Gastrointestinal ROS: no abdominal pain, nausea,vomiting, diarrhea, melena or hematechezia  Genito-Urinary ROS: no dysuria, trouble voiding, or hematuria  Objective:   Physical Exam:     Visit Vitals  BP (!) 132/111   Pulse 96   Temp 98.2 ??F (36.8 ??C)   Resp 16   Ht 5' 8"  (1.727 m)   Wt 85.3 kg (188 lb)   SpO2 98%   BMI 28.59 kg/m??    O2 Flow Rate (L/min): 3 l/min O2 Device: Nasal cannula    Temp (24hrs), Avg:98.1 ??F (36.7 ??C), Min:97.6 ??F (36.4 ??C), Max:99.1 ??F (37.3 ??C)    No intake/output data recorded.   03/10 1901 - 03/12 0700  In: -   Out: 650 [Urine:650]    General:  Alert, cooperative, no distress, appears stated age.   Lungs:   Diffuse exp wheezes    :     Heart:  Regular rate and rhythm, S1, S2 normal, no murmur, click, rub or gallop.   Abdomen:   Soft, non-tender. Bowel sounds normal. No masses,  No organomegaly.   Extremities:  FROM, no LE edema   Pulses: 2+ and symmetric all extremities.   Skin: Skin color, texture, turgor normal. No rashes or lesions   :      Data Review:       24 Hour Results:  Recent Results (from the past 24 hour(s))   CBC WITH AUTOMATED DIFF    Collection Time: 08/22/18  2:10 PM   Result Value Ref Range    WBC 12.3 (H) 4.0 - 11.0 1000/mm3    RBC 4.42 3.60 - 5.20 M/uL    HGB 12.7 (L) 13.0 - 17.2 gm/dl    HCT 39.0 37.0 - 50.0 %    MCV 88.2 80.0 - 98.0 fL     MCH 28.7 25.4 - 34.6 pg    MCHC 32.6 30.0 - 36.0 gm/dl    PLATELET 200 140 - 450 1000/mm3    MPV 11.3 (H) 6.0 - 10.0 fL    RDW-SD 44.4 36.4 - 46.3      NRBC 0 0 - 0      IMMATURE GRANULOCYTES 0.3 0.0 - 3.0 %    NEUTROPHILS 86.6 (H) 34 - 64 %    LYMPHOCYTES 6.1 (L) 28 - 48 %    MONOCYTES 6.3 1 - 13 %    EOSINOPHILS 0.4 0 - 5 %    BASOPHILS 0.3 0 - 3 %   METABOLIC PANEL, COMPREHENSIVE    Collection Time: 08/22/18  2:10 PM   Result Value Ref Range    Sodium 139 136 - 145 mEq/L    Potassium  3.9 3.5 - 5.1 mEq/L    Chloride 103 98 - 107 mEq/L    CO2 29 21 - 32 mEq/L    Glucose 95 74 - 106 mg/dl    BUN 19 7 - 25 mg/dl    Creatinine 1.0 0.6 - 1.3 mg/dl    GFR est AA >60.0      GFR est non-AA 59      Calcium 9.2 8.5 - 10.1 mg/dl    AST (SGOT) 7 (L) 15 - 37 U/L    ALT (SGPT) 15 12 - 78 U/L    Alk. phosphatase 98 45 - 117 U/L    Bilirubin, total 0.2 0.2 - 1.0 mg/dl    Protein, total 7.7 6.4 - 8.2 gm/dl    Albumin 3.3 (L) 3.4 - 5.0 gm/dl    Anion gap 7 5 - 15 mmol/L   TROPONIN I    Collection Time: 08/22/18  2:10 PM   Result Value Ref Range    Troponin-I <0.015 0.000 - 0.045 ng/ml   NT-PRO BNP    Collection Time: 08/22/18  2:10 PM   Result Value Ref Range    NT pro-BNP 18.0 0.0 - 125.0 pg/ml   POC BLOOD GAS + LACTIC ACID    Collection Time: 08/22/18  2:40 PM   Result Value Ref Range    pH 7.400 7.350 - 7.450      PCO2 44.1 35.0 - 45.0 mm Hg    PO2 82.0 75 - 100 mm Hg    BICARBONATE 27.4 (H) 18.0 - 26.0 mmol/L    O2 SAT 96.0 90 - 100 %    CO2, TOTAL 29.0 24 - 29 mmol/L    BASE EXCESS 3 -2 - 3 mmol/L    Patient temp. 98.6 F      Sample type Art      FIO2 30      SITE R Radial      DEVICE BiPAP      ALLENS TEST Pass      IPAP/PIP 15      EPAP/CPAP/PEEP 8      Respiratory Rate 24      SET RATE 8      VTEXP 556     INFLUENZA A/B, BY PCR    Collection Time: 08/22/18  3:10 PM   Result Value Ref Range    Influenza A PCR NEGATIVE NEGATIVE      Influenza B PCR NEGATIVE NEGATIVE     TROPONIN I    Collection Time: 08/22/18  6:31 PM    Result Value Ref Range    Troponin-I <0.015 0.000 - 0.045 ng/ml   GLUCOSE, POC    Collection Time: 08/22/18  9:04 PM   Result Value Ref Range    Glucose (POC) 300 (H) 65 - 105 mg/dL   TROPONIN I    Collection Time: 08/22/18 11:28 PM   Result Value Ref Range    Troponin-I <0.015 0.000 - 0.045 ng/ml   GLUCOSE, POC    Collection Time: 08/23/18 12:09 AM   Result Value Ref Range    Glucose (POC) 218 (H) 65 - 105 mg/dL   CBC WITH AUTOMATED DIFF    Collection Time: 08/23/18  5:19 AM   Result Value Ref Range    WBC 5.8 4.0 - 11.0 1000/mm3    RBC 4.31 3.60 - 5.20 M/uL    HGB 12.4 (L) 13.0 - 17.2 gm/dl    HCT 38.9 37.0 - 50.0 %    MCV  90.3 80.0 - 98.0 fL    MCH 28.8 25.4 - 34.6 pg    MCHC 31.9 30.0 - 36.0 gm/dl    PLATELET 178 140 - 450 1000/mm3    MPV 11.4 (H) 6.0 - 10.0 fL    RDW-SD 44.5 36.4 - 46.3      NRBC 0 0 - 0      IMMATURE GRANULOCYTES 0.3 0.0 - 3.0 %    NEUTROPHILS 90.5 (H) 34 - 64 %    LYMPHOCYTES 7.6 (L) 28 - 48 %    MONOCYTES 1.6 1 - 13 %    EOSINOPHILS 0.0 0 - 5 %    BASOPHILS 0.0 0 - 3 %   METABOLIC PANEL, BASIC    Collection Time: 08/23/18  5:19 AM   Result Value Ref Range    Sodium 140 136 - 145 mEq/L    Potassium 4.5 3.5 - 5.1 mEq/L    Chloride 106 98 - 107 mEq/L    CO2 27 21 - 32 mEq/L    Glucose 172 (H) 74 - 106 mg/dl    BUN 20 7 - 25 mg/dl    Creatinine 0.9 0.6 - 1.3 mg/dl    GFR est AA >60.0      GFR est non-AA >60      Calcium 9.3 8.5 - 10.1 mg/dl    Anion gap 7 5 - 15 mmol/L   GLUCOSE, POC    Collection Time: 08/23/18  8:22 AM   Result Value Ref Range    Glucose (POC) 209 (H) 65 - 105 mg/dL       Problem List:  Problem List as of 08/23/2018 Date Reviewed: 08/27/18          Codes Class Noted - Resolved    Respiratory failure with hypoxia [facility]) ICD-10-CM: J96.91  ICD-9-CM: 518.81  08/22/2018 - Present        Headache ICD-10-CM: R51  ICD-9-CM: 784.0  08/05/2018 - Present        Abdominal pain ICD-10-CM: R10.9  ICD-9-CM: 789.00  02/04/2018 - Present        SIRS (systemic inflammatory response syndrome)  (HCC) ICD-10-CM: R65.10  ICD-9-CM: 995.90  10/09/2017 - Present        Hyperkalemia ICD-10-CM: E87.5  ICD-9-CM: 276.7  09/08/2017 - Present        Obtunded ICD-10-CM: R40.1  ICD-9-CM: 780.09  09/08/2017 - Present        Acute renal failure (ARF) (HCC) ICD-10-CM: N17.9  ICD-9-CM: 584.9  09/08/2017 - Present        Septic shock (HCC) ICD-10-CM: A41.9, R65.21  ICD-9-CM: 038.9, 785.52, 995.92  09/08/2017 - Present        Metabolic encephalopathy ZHY-86-VH: G93.41  ICD-9-CM: 348.31  09/08/2017 - Present        Dehydration ICD-10-CM: E86.0  ICD-9-CM: 276.51  08/07/2017 - Present        COPD with acute exacerbation (HCC) ICD-10-CM: J44.1  ICD-9-CM: 491.21  08/07/2017 - Present        Acute-on-chronic kidney injury ([place]) ICD-10-CM: N17.9, N18.9  ICD-9-CM: 584.9, 585.9  08/07/2017 - Present        Chest pain ICD-10-CM: R07.9  ICD-9-CM: 786.50  06/15/2017 - Present        Dyspnea ICD-10-CM: R06.00  ICD-9-CM: 786.09  06/15/2017 - Present        Type 2 diabetes mellitus with diabetic neuropathy (HCC) ICD-10-CM: E11.40  ICD-9-CM: 250.60, 357.2  12/07/2016 - Present        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  ICD-9-CM: 569.89  08/17/2016 - Present        Cannabinoid hyperemesis syndrome (HCC) ICD-10-CM: F12.988  ICD-9-CM: 536.2, 305.20  08/17/2016 - Present        Gastritis ICD-10-CM: K29.70  ICD-9-CM: 535.50  08/16/2016 - Present        Nausea & vomiting ICD-10-CM: R11.2  ICD-9-CM: 787.01  08/16/2016 - Present        Asthma with acute exacerbation ICD-10-CM: J45.901  ICD-9-CM: 493.92  08/04/2016 - Present        Lactic acidosis ICD-10-CM: E87.2  ICD-9-CM: 276.2  07/24/2016 - Present        Leukocytosis ICD-10-CM: D72.829  ICD-9-CM: 288.60  07/24/2016 - Present        Sepsis ([place]) ICD-10-CM: A41.9  ICD-9-CM: 038.9, 995.91  07/24/2016 - Present        Asthma exacerbation ICD-10-CM: J45.901  ICD-9-CM: 493.92  07/24/2016 - Present        Type 2 diabetes mellitus with nephropathy ([place]) ICD-10-CM: E11.21  ICD-9-CM: 250.40, 583.81  06/12/2016 - Present         Sacroiliitis ([place]) ICD-10-CM: M46.1  ICD-9-CM: 720.2  11/25/2015 - Present        Essential hypertension ICD-10-CM: I10  ICD-9-CM: 401.9  10/06/2015 - Present        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  ICD-9-CM: 721.3  09/15/2015 - Present        Lumbar and sacral osteoarthritis ICD-10-CM: M47.817  ICD-9-CM: 721.3  09/15/2015 - Present        Chronic pain syndrome ICD-10-CM: G89.4  ICD-9-CM: 338.4  09/15/2015 - Present        Type 2 diabetes mellitus with hyperglycemia, without long-term current use of insulin ([place]) ICD-10-CM: E11.65  ICD-9-CM: 250.00, 790.29  08/20/2015 - Present        Acute colitis ICD-10-CM: K52.9  ICD-9-CM: 558.9  07/02/2015 - Present        Acute hyperglycemia ICD-10-CM: R73.9  ICD-9-CM: 790.29  07/02/2015 - Present        Accelerated hypertension ICD-10-CM: I10  ICD-9-CM: 401.0  04/08/2015 - Present        Severe headache ICD-10-CM: R51  ICD-9-CM: 784.0  04/07/2015 - Present        Osteoarthritis of hips, 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  ICD-9-CM: 715.95  01/29/2015 - Present        PTSD (post-traumatic stress disorder) (Chronic) ICD-10-CM: F43.10  ICD-9-CM: 309.81  01/29/2015 - Present        Severe hypertension (Chronic) ICD-10-CM: I10  ICD-9-CM: 401.9  07/21/2014 - Present        RESOLVED: COPD exacerbation ([place]) ICD-10-CM: J44.1  ICD-9-CM: 491.21  11/14/2017 - 08/08/2018        RESOLVED: New onset type 1 diabetes mellitus, uncontrolled ([place]) ICD-10-CM: E10.65  ICD-9-CM: 250.03  07/02/2015 - 10/06/2015        RESOLVED: Recurrent major depressive disorder, in full remission ([place]) ICD-10-CM: F33.42  ICD-9-CM: 296.36  12/02/2014 - 12/02/2014        RESOLVED: Foreign body in colon ICD-10-CM: T18.4XXA  ICD-9-CM: 132  08/14/2014 - 01/29/2015        RESOLVED: Advanced care planning/counseling discussion ICD-10-CM: Z71.89  ICD-9-CM: V65.49  07/21/2014 - 01/29/2015    Overview Signed 07/21/2014  3:46 PM by [place]     Patient given [place] application                RESOLVED: Hip pain, chronic ICD-10-CM:  M25.559, [id number]  ICD-9-CM: 719.45, 338.29  07/21/2014 - 01/29/2015               Medications reviewed  Current Facility-Administered Medications   Medication Dose Route Frequency   ??? [START ON 08/24/2018] revefenacin (YUPELRI) nebulizer solution 175 mcg  175 mcg Nebulization DAILY   ??? [Held by provider] cloNIDine HCL (CATAPRES) tablet 0.3 mg  0.3 mg Oral BID   ??? dilTIAZem CD (CARDIZEM CD) capsule 180 mg  180 mg Oral DAILY   ??? gabapentin (NEURONTIN) capsule 300 mg  300 mg Oral TID   ??? [Held by provider] losartan (COZAAR) tablet 12.5 mg  12.5 mg Oral DAILY   ??? montelukast (SINGULAIR) tablet 10 mg  10 mg Oral QHS   ??? naloxone (NARCAN) injection 0.1 mg  0.1 mg IntraVENous PRN   ??? acetaminophen (TYLENOL) tablet 650 mg  650 mg Oral Q4H PRN    Or   ??? acetaminophen (TYLENOL) solution 650 mg  650 mg Oral Q4H PRN    Or   ??? acetaminophen (TYLENOL) suppository 650 mg  650 mg Rectal Q4H PRN   ??? HYDROcodone-acetaminophen (NORCO) 5-325 mg per tablet 1 Tab  1 Tab Oral Q4H PRN   ??? ondansetron (ZOFRAN) injection 4 mg  4 mg IntraVENous Q4H PRN   ??? MAGNESIUM ELECTROLYTE REPLACEMENT PROTOCOL STANDARD DOSING  1 Each Other PRN   ??? POTASSIUM ELECTROLYTE REPLACEMENT PROTOCOL STANDARD DOSING  1 Each Other PRN   ??? CALCIUM ELECTROLYTE REPLACEMENT PROTOCOL STANDARD DOSING  1 Each Other PRN   ??? PHOSPHATE ELECTROLYTE REPLACEMENT PROTOCOL STANDARD DOSING  1 Each Other PRN   ??? heparin (porcine) injection 5,000 Units  5,000 Units SubCUTAneous Q8H   ??? methylPREDNISolone (PF) (SOLU-MEDROL) injection 40 mg  40 mg IntraVENous Q6H   ??? arformoteroL (BROVANA) neb solution 15 mcg  15 mcg Nebulization BID RT   ??? budesonide (PULMICORT) 500 mcg/2 ml nebulizer suspension  500 mcg Nebulization BID RT   ??? azithromycin (ZITHROMAX) 500 mg in 0.9% sodium chloride 250 mL IVPB  500 mg IntraVENous Q24H   ??? albuterol (PROVENTIL VENTOLIN) nebulizer solution 2.5 mg  2.5 mg Nebulization Q2H PRN   ??? albuterol-ipratropium (DUO-NEB) 2.5  MG-0.5 MG/3 ML  3 mL Nebulization Q4H RT   ??? dextrose (D50W) injection syrg 5-25 g  10-50 mL IntraVENous PRN   ??? glucagon (GLUCAGEN) injection 1 mg  1 mg IntraMUSCular PRN   ??? insulin glargine (LANTUS) injection 1-100 Units  1-100 Units SubCUTAneous QHS   ??? insulin lispro (HUMALOG) injection 1-100 Units  1-100 Units SubCUTAneous AC&HS   ??? insulin lispro (HUMALOG) injection 1-100 Units  1-100 Units SubCUTAneous PRN        Care Plan discussed with: pt & RN    Total time spent with patient: 35 minutes.    Carlean Jews, MD  August 23, 2018  9:28 AM

## 2018-08-23 NOTE — Progress Notes (Signed)
Problem: Falls - Risk of  Goal: *Absence of Falls  Description: Document [name] [name] Fall Risk and appropriate interventions in the flowsheet.  Outcome: Progressing Towards Goal  Note: Fall Risk Interventions:  Mobility Interventions: Bed/chair exit alarm, Patient to call before getting OOB, PT Consult for mobility concerns, PT Consult for assist device competence, Utilize walker, cane, or other assistive device         Medication Interventions: Bed/chair exit alarm, Evaluate medications/consider consulting pharmacy, Patient to call before getting OOB, Teach patient to arise slowly    Elimination Interventions: Bed/chair exit alarm, Call light in reach, Toileting schedule/hourly rounds              Problem: Patient Education: Go to Patient Education Activity  Goal: Patient/Family Education  Outcome: Progressing Towards Goal

## 2018-08-23 NOTE — Progress Notes (Signed)
Problem: Gas Exchange - Impaired  Goal: *Absence of hypoxia  Outcome: Progressing Towards Goal     Problem: Patient Education: Go to Patient Education Activity  Goal: Patient/Family Education  Outcome: Progressing Towards Goal     Problem: Falls - Risk of  Goal: *Absence of Falls  Description: Document [name] Fall Risk and appropriate interventions in the flowsheet.  Outcome: Progressing Towards Goal  Note: Fall Risk Interventions:  Mobility Interventions: Bed/chair exit alarm, Patient to call before getting OOB         Medication Interventions: Bed/chair exit alarm, Patient to call before getting OOB, Teach patient to arise slowly    Elimination Interventions: Bed/chair exit alarm, Call light in reach, Patient to call for help with toileting needs

## 2018-08-23 NOTE — Progress Notes (Signed)
Progress Notes by Verlan Friends, MD at 08/23/18 1422                Author: Verlan Friends, MD  Service: Pulmonary Disease  Author Type: Physician       Filed: 08/23/18 1426  Date of Service: 08/23/18 1422  Status: Signed          Editor: Verlan Friends, MD (Physician)                       [facility] PULMONARY AND CRITICAL CARE MEDICINE            Name:  [name] [name]  MRN:  [id number]          DOB:  12-20-1950  Hospital:  [facility]     Date:  08/23/2018               IMPRESSION:     ??    Worsening shortness of breath, shortness of breath, and wheezing due to COPD with exacerbation   ??  COPD with exacerbation   ??  GERD   ??  PTSD              PLAN:     ??    Continue BiPAP with current settings (15/8/30%) on regular basis during night time, and as needed basis during daytime,.  Titrate  FiO2, and target oxygen saturation greater than 88%.   ??  Continue  Brovana and Pulmicort per nebulizer twice a day.  Continue  Yupelri through nebulizer once a day.   ??  Continue Solu-Medrol 40 mg IV every 6 hours for now. If remains stable, switch to PO prednisone from tomorrow.  Continue Singulair once a day.   ??  Continue azithromycin for acute bronchitis.  Send a sputum Gram stain and culture   ??  Continue subcu heparin for DVT prophylaxis          Subjective/Interval History:              Review of Systems    Constitutional: Negative.     HENT: Negative.     Eyes: Negative.     Respiratory: Positive for cough.     Cardiovascular: Negative.     Gastrointestinal: Negative.     Genitourinary: Negative.     Musculoskeletal: Negative.     Skin: Negative.     Neurological: Negative.     Endo/Heme/Allergies: Negative.     Psychiatric/Behavioral: Negative.             Objective:     Vital Signs:       Visit Vitals   BP  134/83      Pulse  (!) 104      Temp  97.9 ??F (36.6 ??C)      Resp  16      Ht  5\' 8"  (1.727 m)      Wt  85.3 kg (188 lb)      SpO2  100%      BMI  28.59 kg/m??                O2  Device: Nasal cannula     O2 Flow Rate (L/min): 3 l/min     Temp (24hrs), Avg:97.9 ??F (36.6 ??C), Min:97.6 ??F (36.4 ??C), Max:98.3 ??F (36.8 ??C)           Intake/Output:    Last shift:      03/12 0701 - 03/12 1900  In: -    Out: 525 [Urine:525]   Last 3 shifts: 03/10 1901 - 03/12 0700   In: -    Out: 650 [Urine:650]      Intake/Output Summary (Last 24 hours) at 08/23/2018 1422   Last data filed at 08/23/2018 1125     Gross per 24 hour        Intake  --        Output  1175 ml        Net  -1175 ml            Physical Exam   Vitals signs and nursing note reviewed.   Constitutional:        Appearance: Normal appearance.   HENT :       Head: Normocephalic and atraumatic.      Mouth/Throat:      Mouth: Mucous membranes are moist.    Cardiovascular:       Rate and Rhythm: Normal rate and regular rhythm.   Pulmonary :       Effort: Pulmonary effort is normal.      Breath sounds: Wheezing present.   Abdominal :      General: Bowel sounds are normal.      Tenderness: There is no abdominal tenderness.     Musculoskeletal:          General: No swelling.    Skin:      General: Skin is warm and dry.   Neurological :       General: No focal deficit present.      Mental Status: She is alert and oriented to person, place, and time.    Psychiatric:         Mood and Affect: Mood normal.                  DATA:   Labs:     Recent Labs            08/23/18   0519  08/22/18   1410     WBC  5.8  12.3*     HGB  12.4*  12.7*     HCT  38.9  39.0         PLT  178  200          Recent Labs             08/23/18   0519  08/22/18   1440  08/22/18   1410     NA  140   --   139     K  4.5   --   3.9     CL  106   --   103     CO2  27  29.0  29     GLU  172*   --   95     BUN  20   --   19     CREA  0.9   --   1.0     CA  9.3   --   9.2     ALB   --    --   3.3*     SGOT   --    --   7*          ALT   --    --   15          Recent Labs           08/22/18  1440     PH  7.400     PCO2  44.1     PO2  82.0     HCO3  27.4*        FIO2  30                  Results               Procedure  Component  Value  Units  Date/Time           CULTURE, RESPIRATORY/SPUTUM/BRONCH [name] GRAM STAIN [[id number]][[id number]]              Order Status:  No result  Specimen:  Sputum               XR Results (most recent):     Results from Hospital Encounter encounter on 08/22/18     XR CHEST SNGL V           Narrative  Clinical history: Shortness of breath      EXAMINATION:   Single view of the chest 08/22/2018      Correlation: Chest radiograph 08/04/2018 and CTA chest 02/18/2018      FINDINGS:   Trachea and heart size are within normal limits. Scarring/atelectasis left mid   to lower lung. 2 cm left lower lobe nodule unchanged from chest radiograph of   08/04/2018 and CTA chest 02/18/2018. Right lung is clear.              Impression  IMPRESSION:   1. No acute pulmonary process.   2. 2 cm stable nodule left lower lobe unchanged from 08/04/2018 and evident on CT   scan 02/18/2018.                 Imaging:   [x]  I have personally reviewed the patients radiographs   []  Radiographs reviewed with radiologist    []  No change from prior, tubes and lines in adequate position   [] Improved   [] Worsening            Verlan FriendsManojkumar Parry Po, MD

## 2018-08-24 LAB — POCT GLUCOSE
POC Glucose: 229 mg/dL — ABNORMAL HIGH (ref 65–105)
POC Glucose: 161 mg/dL — ABNORMAL HIGH (ref 65–105)
POC Glucose: 231 mg/dL — ABNORMAL HIGH (ref 65–105)
POC Glucose: 140 mg/dL — ABNORMAL HIGH (ref 65–105)

## 2018-08-24 LAB — GLUCOSE, POC
Glucose (POC): 229 mg/dL — ABNORMAL HIGH (ref 65–105)
Glucose (POC): 161 mg/dL — ABNORMAL HIGH (ref 65–105)
Glucose (POC): 231 mg/dL — ABNORMAL HIGH (ref 65–105)
Glucose (POC): 140 mg/dL — ABNORMAL HIGH (ref 65–105)

## 2018-08-24 MED ORDER — QUETIAPINE SR 50 MG 24 HR TAB
50 mg | Freq: Every evening | ORAL | Status: DC
Start: 2018-08-24 — End: 2018-09-07
  Administered 2018-08-25 – 2018-09-07 (×13): via ORAL

## 2018-08-24 MED ORDER — PREDNISONE 20 MG TAB
20 mg | Freq: Every day | ORAL | Status: DC
Start: 2018-08-24 — End: 2018-08-26
  Administered 2018-08-25 – 2018-08-26 (×2): via ORAL

## 2018-08-24 MED ORDER — AZITHROMYCIN 250 MG TAB
250 mg | Freq: Every day | ORAL | Status: DC
Start: 2018-08-24 — End: 2018-08-24

## 2018-08-24 MED ORDER — AZITHROMYCIN 250 MG TAB
250 mg | Freq: Every day | ORAL | Status: AC
Start: 2018-08-24 — End: 2018-08-26
  Administered 2018-08-24 – 2018-08-26 (×3): via ORAL

## 2018-08-24 MED ORDER — QUETIAPINE 100 MG TAB
100 mg | Freq: Every evening | ORAL | Status: DC
Start: 2018-08-24 — End: 2018-08-24

## 2018-08-24 MED ORDER — TRAMADOL 50 MG TAB
50 mg | ORAL | Status: AC
Start: 2018-08-24 — End: 2018-08-24
  Administered 2018-08-24: 07:00:00 via ORAL

## 2018-08-24 MED ORDER — ALBUTEROL SULFATE 0.083 % (0.83 MG/ML) SOLN FOR INHALATION
2.5 mg /3 mL (0.083 %) | Freq: Four times a day (QID) | RESPIRATORY_TRACT | Status: DC
Start: 2018-08-24 — End: 2018-08-24

## 2018-08-24 MED ORDER — ALBUTEROL SULFATE 0.083 % (0.83 MG/ML) SOLN FOR INHALATION
2.5 mg /3 mL (0.083 %) | Freq: Four times a day (QID) | RESPIRATORY_TRACT | Status: DC
Start: 2018-08-24 — End: 2018-08-25
  Administered 2018-08-24 – 2018-08-25 (×4): via RESPIRATORY_TRACT

## 2018-08-24 MED ORDER — KETOROLAC TROMETHAMINE 15 MG/ML INJECTION
15 mg/mL | Freq: Four times a day (QID) | INTRAMUSCULAR | Status: AC | PRN
Start: 2018-08-24 — End: 2018-08-29
  Administered 2018-08-24 – 2018-08-28 (×4): via INTRAVENOUS

## 2018-08-24 MED ORDER — CLONIDINE 0.1 MG TAB
0.1 mg | Freq: Two times a day (BID) | ORAL | Status: DC
Start: 2018-08-24 — End: 2018-09-07
  Administered 2018-08-24 – 2018-09-07 (×29): via ORAL

## 2018-08-24 MED FILL — HYDROCODONE-ACETAMINOPHEN 5 MG-325 MG TAB: 5-325 mg | ORAL | Qty: 1

## 2018-08-24 MED FILL — SOLU-MEDROL (PF) 40 MG/ML SOLUTION FOR INJECTION: 40 mg/mL | INTRAMUSCULAR | Qty: 1

## 2018-08-24 MED FILL — KETOROLAC TROMETHAMINE 15 MG/ML INJECTION: 15 mg/mL | INTRAMUSCULAR | Qty: 1

## 2018-08-24 MED FILL — GABAPENTIN 300 MG CAP: 300 mg | ORAL | Qty: 1

## 2018-08-24 MED FILL — BUDESONIDE 0.5 MG/2 ML NEB SUSPENSION: 0.5 mg/2 mL | RESPIRATORY_TRACT | Qty: 1

## 2018-08-24 MED FILL — TYLENOL 325 MG TABLET: 325 mg | ORAL | Qty: 2

## 2018-08-24 MED FILL — AZITHROMYCIN 500 MG IV SOLUTION: 500 mg | INTRAVENOUS | Qty: 5

## 2018-08-24 MED FILL — DEXTROMETHORPHAN POLY COMPLEX SR 30 MG/5 ML ORAL 12 HR SUSP: 30 mg/5 mL | ORAL | Qty: 5

## 2018-08-24 MED FILL — MONTELUKAST 10 MG TAB: 10 mg | ORAL | Qty: 1

## 2018-08-24 MED FILL — SEROQUEL XR 50 MG TABLET,EXTENDED RELEASE: 50 mg | ORAL | Qty: 3

## 2018-08-24 MED FILL — IPRATROPIUM-ALBUTEROL 2.5 MG-0.5 MG/3 ML NEB SOLUTION: 2.5 mg-0.5 mg/3 ml | RESPIRATORY_TRACT | Qty: 3

## 2018-08-24 MED FILL — ALBUTEROL SULFATE 0.083 % (0.83 MG/ML) SOLN FOR INHALATION: 2.5 mg /3 mL (0.083 %) | RESPIRATORY_TRACT | Qty: 1

## 2018-08-24 MED FILL — HEPARIN (PORCINE) 5,000 UNIT/ML IJ SOLN: 5000 unit/mL | INTRAMUSCULAR | Qty: 1

## 2018-08-24 MED FILL — BROVANA 15 MCG/2 ML SOLUTION FOR NEBULIZATION: 15 mcg/2 mL | RESPIRATORY_TRACT | Qty: 1

## 2018-08-24 MED FILL — AZITHROMYCIN 250 MG TAB: 250 mg | ORAL | Qty: 2

## 2018-08-24 MED FILL — TRAMADOL 50 MG TAB: 50 mg | ORAL | Qty: 1

## 2018-08-24 MED FILL — CLONIDINE 0.1 MG TAB: 0.1 mg | ORAL | Qty: 1

## 2018-08-24 MED FILL — DILTIAZEM ER 180 MG 24 HR CAP: 180 mg | ORAL | Qty: 1

## 2018-08-24 MED FILL — LOSARTAN 25 MG TAB: 25 mg | ORAL | Qty: 1

## 2018-08-24 NOTE — Other (Signed)
New orders from dr Lyman Bishop to resume pt's home dose of clonidine 0.3 mg bid(first dose now) and tramodol 50 mg pox 1 dose

## 2018-08-24 NOTE — Other (Signed)
Sent message to hospitalist to inform of bp of 171/99 and pt complaining of constant headache not relieved by narco. Awaiting call back.

## 2018-08-24 NOTE — Progress Notes (Signed)
Hospitalist Progress Note         [facility]    Daily Progress Note: 08/24/2018    Assessment/Plan:   1.  Acute hypoxic respiratory failure, POA.  2.  Acute exacerbation COPD.  3.  Acute bronchitis  4.  HTN  5.  T2DM  6.  Left upper lobe lung nodule, will need continued follow-up   7.  Dyslipidemia  8.  PTSD, she was in [employer] on [place], has had respiratory problems since.  Smoked very briefly in the distant past  9.  Peripheral neuropathy  10.  GERD.    11.  DJD  12.  DVT proph w sc Heparin  13.  Dispo: Improved. To floor.   Subjective:    SOB & wheezing a bit improved. Cough w white sputum. No C/P.       Cardiovascular ROS: no chest pain ,  orthopnea  Gastrointestinal ROS: no abdominal pain, nausea,vomiting, diarrhea, melena or hematechezia  Genito-Urinary ROS: no dysuria, trouble voiding, or hematuria  Objective:   Physical Exam:     Visit Vitals  BP (!) 149/107   Pulse 95   Temp 96.8 ??F (36 ??C)   Resp 18   Ht  (1.727 m)   Wt 92.8 kg (204 lb 9.4 oz)   SpO2 99%   BMI 31.11 kg/m??    O2 Flow Rate (L/min): 2 l/min O2 Device: Nasal cannula    Temp (24hrs), Avg:97.6 ??F (36.4 ??C), Min:96.8 ??F (36 ??C), Max:98.2 ??F (36.8 ??C)    03/13 0701 - 03/13 1900  In: -   Out: 340 [Urine:340]   03/11 1901 - 03/13 0700  In: 250 [I.V.:250]  Out: 3025 [Urine:3025]    General:  Alert, cooperative, no distress, appears stated age.   Lungs:    Less exp wheezes    :     Heart:  Regular rate and rhythm, S1, S2 normal, no murmur, click, rub or gallop.   Abdomen:   Soft, non-tender. Bowel sounds normal. No masses,  No organomegaly.   Extremities:  FROM, no LE edema   Pulses: 2+ and symmetric all extremities.   Skin: Skin color, texture, turgor normal. No rashes or lesions   :      Data Review:       24 Hour Results:  Recent Results (from the past 24 hour(s))   GLUCOSE, POC    Collection Time: 08/23/18  5:43 PM   Result Value Ref Range    Glucose (POC) 167 (H) 65 - 105 mg/dL   GLUCOSE, POC     Collection Time: 08/23/18  9:12 PM   Result Value Ref Range    Glucose (POC) 229 (H) 65 - 105 mg/dL   GLUCOSE, POC    Collection Time: 08/24/18  7:56 AM   Result Value Ref Range    Glucose (POC) 161 (H) 65 - 105 mg/dL   GLUCOSE, POC    Collection Time: 08/24/18 11:32 AM   Result Value Ref Range    Glucose (POC) 231 (H) 65 - 105 mg/dL       Problem List:  Problem List as of 08/24/2018 Date Reviewed: 08/09/18          Codes Class Noted - Resolved    Respiratory failure with hypoxia [facility]) ICD-10-CM: J96.91  ICD-9-CM: 518.81  08/22/2018 - Present        Headache ICD-10-CM: R51  ICD-9-CM: 784.0  08/05/2018 - Present        Abdominal  pain ICD-10-CM: R10.9  ICD-9-CM: 789.00  02/04/2018 - Present        SIRS (systemic inflammatory response syndrome) (HCC) ICD-10-CM: R65.10  ICD-9-CM: 995.90  10/09/2017 - Present        Hyperkalemia ICD-10-CM: E87.5  ICD-9-CM: 276.7  09/08/2017 - Present        Obtunded ICD-10-CM: R40.1  ICD-9-CM: 780.09  09/08/2017 - Present        Acute renal failure (ARF) (HCC) ICD-10-CM: N17.9  ICD-9-CM: 584.9  09/08/2017 - Present        Septic shock (HCC) ICD-10-CM: A41.9, R65.21  ICD-9-CM: 038.9, 785.52, 995.92  09/08/2017 - Present        Metabolic encephalopathy ICD-10-CM: G93.41  ICD-9-CM: 348.31  09/08/2017 - Present        Dehydration ICD-10-CM: E86.0  ICD-9-CM: 276.51  08/07/2017 - Present        COPD with acute exacerbation (HCC) ICD-10-CM: J44.1  ICD-9-CM: 491.21  08/07/2017 - Present        Acute-on-chronic kidney injury (HCC) ICD-10-CM: N17.9, N18.9  ICD-9-CM: 584.9, 585.9  08/07/2017 - Present        Chest pain ICD-10-CM: R07.9  ICD-9-CM: 786.50  06/15/2017 - Present        Dyspnea ICD-10-CM: R06.00  ICD-9-CM: 786.09  06/15/2017 - Present        Type 2 diabetes mellitus with diabetic neuropathy (HCC) ICD-10-CM: E11.40  ICD-9-CM: 250.60, 357.2  12/07/2016 - Present        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  ICD-9-CM: 569.89  08/17/2016 - Present         Cannabinoid hyperemesis syndrome (HCC) ICD-10-CM: F12.988  ICD-9-CM: 536.2, 305.20  08/17/2016 - Present        Gastritis ICD-10-CM: K29.70  ICD-9-CM: 535.50  08/16/2016 - Present        Nausea & vomiting ICD-10-CM: R11.2  ICD-9-CM: 787.01  08/16/2016 - Present        Asthma with acute exacerbation ICD-10-CM: J45.901  ICD-9-CM: 493.92  08/04/2016 - Present        Lactic acidosis ICD-10-CM: E87.2  ICD-9-CM: 276.2  07/24/2016 - Present        Leukocytosis ICD-10-CM: D72.829  ICD-9-CM: 288.60  07/24/2016 - Present        Sepsis (HCC) ICD-10-CM: A41.9  ICD-9-CM: 038.9, 995.91  07/24/2016 - Present        Asthma exacerbation ICD-10-CM: J45.901  ICD-9-CM: 493.92  07/24/2016 - Present        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: E11.21  ICD-9-CM: 250.40, 583.81  06/12/2016 - Present        Sacroiliitis (HCC) ICD-10-CM: M46.1  ICD-9-CM: 720.2  11/25/2015 - Present        Essential hypertension ICD-10-CM: I10  ICD-9-CM: 401.9  10/06/2015 - Present        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  ICD-9-CM: 721.3  09/15/2015 - Present        Lumbar and sacral osteoarthritis ICD-10-CM: M47.817  ICD-9-CM: 721.3  09/15/2015 - Present        Chronic pain syndrome ICD-10-CM: G89.4  ICD-9-CM: 338.4  09/15/2015 - Present        Type 2 diabetes mellitus with hyperglycemia, without long-term current use of insulin (HCC) ICD-10-CM: E11.65  ICD-9-CM: 250.00, 790.29  08/20/2015 - Present        Acute colitis ICD-10-CM: K52.9  ICD-9-CM: 558.9  07/02/2015 - Present        Acute hyperglycemia ICD-10-CM: R73.9  ICD-9-CM:  790.29  07/02/2015 - Present        Accelerated hypertension ICD-10-CM: I10  ICD-9-CM: 401.0  04/08/2015 - Present        Severe headache ICD-10-CM: R51  ICD-9-CM: 784.0  04/07/2015 - Present        Osteoarthritis of hips, 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  ICD-9-CM: 715.95  01/29/2015 - Present        PTSD (post-traumatic stress disorder) (Chronic) ICD-10-CM: F43.10   ICD-9-CM: 309.81  01/29/2015 - Present        Severe hypertension (Chronic) ICD-10-CM: I10  ICD-9-CM: 401.9  07/21/2014 - Present        RESOLVED: COPD exacerbation (HCC) ICD-10-CM: J44.1  ICD-9-CM: 491.21  11/14/2017 - 08/08/2018        RESOLVED: New onset type 1 diabetes mellitus, uncontrolled (HCC) ICD-10-CM: E10.65  ICD-9-CM: 250.03  07/02/2015 - 10/06/2015        RESOLVED: Recurrent major depressive disorder, in full remission (HCC) ICD-10-CM: F33.42  ICD-9-CM: 296.36  12/02/2014 - 12/02/2014        RESOLVED: Foreign body in colon ICD-10-CM: T18.4XXA  ICD-9-CM: 936  08/14/2014 - 01/29/2015        RESOLVED: Advanced care planning/counseling discussion ICD-10-CM: Z71.89  ICD-9-CM: V65.49  07/21/2014 - 01/29/2015    Overview Signed 07/21/2014  3:46 PM by Littie Deeds     Patient given [place] application               RESOLVED: Hip pain, chronic ICD-10-CM: M25.559, G89.29  ICD-9-CM: 719.45, 338.29  07/21/2014 - 01/29/2015               Medications reviewed  Current Facility-Administered Medications   Medication Dose Route Frequency   ??? cloNIDine HCL (CATAPRES) tablet 0.3 mg  0.3 mg Oral BID   ??? predniSONE (DELTASONE) tablet 40 mg  40 mg Oral DAILY WITH BREAKFAST   ??? azithromycin (ZITHROMAX) tablet 500 mg  500 mg Oral DAILY   ??? revefenacin (YUPELRI) nebulizer solution 175 mcg  175 mcg Nebulization DAILY   ??? dextromethorphan (DELSYM) 30 mg/5 mL syrup 30 mg  30 mg Oral Q12H PRN   ??? dilTIAZem CD (CARDIZEM CD) capsule 180 mg  180 mg Oral DAILY   ??? gabapentin (NEURONTIN) capsule 300 mg  300 mg Oral TID   ??? losartan (COZAAR) tablet 12.5 mg  12.5 mg Oral DAILY   ??? montelukast (SINGULAIR) tablet 10 mg  10 mg Oral QHS   ??? naloxone (NARCAN) injection 0.1 mg  0.1 mg IntraVENous PRN   ??? acetaminophen (TYLENOL) tablet 650 mg  650 mg Oral Q4H PRN    Or   ??? acetaminophen (TYLENOL) solution 650 mg  650 mg Oral Q4H PRN    Or   ??? acetaminophen (TYLENOL) suppository 650 mg  650 mg Rectal Q4H PRN    ??? HYDROcodone-acetaminophen (NORCO) 5-325 mg per tablet 1 Tab  1 Tab Oral Q4H PRN   ??? ondansetron (ZOFRAN) injection 4 mg  4 mg IntraVENous Q4H PRN   ??? MAGNESIUM ELECTROLYTE REPLACEMENT PROTOCOL STANDARD DOSING  1 Each Other PRN   ??? POTASSIUM ELECTROLYTE REPLACEMENT PROTOCOL STANDARD DOSING  1 Each Other PRN   ??? CALCIUM ELECTROLYTE REPLACEMENT PROTOCOL STANDARD DOSING  1 Each Other PRN   ??? PHOSPHATE ELECTROLYTE REPLACEMENT PROTOCOL STANDARD DOSING  1 Each Other PRN   ??? heparin (porcine) injection 5,000 Units  5,000 Units SubCUTAneous Q8H   ??? arformoteroL (BROVANA) neb solution 15 mcg  15 mcg Nebulization BID RT   ???  budesonide (PULMICORT) 500 mcg/2 ml nebulizer suspension  500 mcg Nebulization BID RT   ??? albuterol (PROVENTIL VENTOLIN) nebulizer solution 2.5 mg  2.5 mg Nebulization Q2H PRN   ??? albuterol-ipratropium (DUO-NEB) 2.5 MG-0.5 MG/3 ML  3 mL Nebulization Q4H RT   ??? dextrose (D50W) injection syrg 5-25 g  10-50 mL IntraVENous PRN   ??? glucagon (GLUCAGEN) injection 1 mg  1 mg IntraMUSCular PRN   ??? insulin glargine (LANTUS) injection 1-100 Units  1-100 Units SubCUTAneous QHS   ??? insulin lispro (HUMALOG) injection 1-100 Units  1-100 Units SubCUTAneous AC&HS   ??? insulin lispro (HUMALOG) injection 1-100 Units  1-100 Units SubCUTAneous PRN        Care Plan discussed with: pt     Total time spent with patient: 35 minutes.    Dorothe Pea, MD  August 24, 2018  1244

## 2018-08-24 NOTE — Progress Notes (Signed)
[facility]     Name: [name] [name] MRN: [id number]   DOB: Sep 13, 1950 Hospital: [facility]   Date: 08/24/2018        IMPRESSION:   ?? Worsening shortness of breath, shortness of breath, and wheezing due to COPD with exacerbation  ?? COPD with exacerbation  ?? GERD  ?? PTSD      PLAN:   ?? Continue BiPAP with current settings (15/8/30%) on regular basis during night time, and as needed basis during daytime,.  Titrate FiO2, and target oxygen saturation greater than 88%.  ?? Continue  Brovana and Pulmicort per nebulizer twice a day.  Continue  Yupelri through nebulizer once a day.  ?? Switch to PO prednisone 40 mg once a day.  Continue Singulair once a day.  ?? Switch to po azithromycin for acute bronchitis.  Send a sputum Gram stain and culture  ?? Continue subcu heparin for DVT prophylaxis  ?? Will sign off      Subjective/Interval History:         Review of Systems   Constitutional: Negative.    HENT: Negative.    Eyes: Negative.    Respiratory: Positive for cough.    Cardiovascular: Negative.    Gastrointestinal: Negative.    Genitourinary: Negative.    Musculoskeletal: Negative.    Skin: Negative.    Neurological: Negative.    Endo/Heme/Allergies: Negative.    Psychiatric/Behavioral: Negative.        Objective:   Vital Signs:    Visit Vitals  BP (!) 149/107   Pulse 95   Temp 96.8 ??F (36 ??C)   Resp 18   Ht 5\' 8"  (1.727 m)   Wt 92.8 kg (204 lb 9.4 oz)   SpO2 100%   BMI 31.11 kg/m??       O2 Device: Nasal cannula   O2 Flow Rate (L/min): 2 l/min   Temp (24hrs), Avg:97.7 ??F (36.5 ??C), Min:96.8 ??F (36 ??C), Max:98.2 ??F (36.8 ??C)       Intake/Output:   Last shift:      No intake/output data recorded.  Last 3 shifts: 03/11 1901 - 03/13 0700  In: 250 [I.V.:250]  Out: 3025 [Urine:3025]    Intake/Output Summary (Last 24 hours) at 08/24/2018 0946  Last data filed at 08/24/2018 0549  Gross per 24 hour   Intake 250 ml   Output 2025 ml   Net -1775 ml        Physical Exam   Vitals signs and nursing note reviewed.   Constitutional:       Appearance: Normal appearance.   HENT:      Head: Normocephalic and atraumatic.      Mouth/Throat:      Mouth: Mucous membranes are moist.   Cardiovascular:      Rate and Rhythm: Normal rate and regular rhythm.   Pulmonary:      Effort: Pulmonary effort is normal.      Breath sounds: Wheezing present.   Abdominal:      General: Bowel sounds are normal.      Tenderness: There is no abdominal tenderness.   Musculoskeletal:         General: No swelling.   Skin:     General: Skin is warm and dry.   Neurological:      General: No focal deficit present.      Mental Status: She is alert and oriented to person, place, and time.   Psychiatric:  Mood and Affect: Mood normal.             DATA:  Labs:  Recent Labs     08/23/18  0519 08/22/18  1410   WBC 5.8 12.3*   HGB 12.4* 12.7*   HCT 38.9 39.0   PLT 178 200     Recent Labs     08/23/18  0519 08/22/18  1440 08/22/18  1410   NA 140  --  139   K 4.5  --  3.9   CL 106  --  103   CO2 27 29.0 29   GLU 172*  --  95   BUN 20  --  19   CREA 0.9  --  1.0   CA 9.3  --  9.2   ALB  --   --  3.3*   SGOT  --   --  7*   ALT  --   --  15     Recent Labs     08/22/18  1440   PH 7.400   PCO2 44.1   PO2 82.0   HCO3 27.4*   FIO2 30          Results     Procedure Component Value Units Date/Time    CULTURE, RESPIRATORY/SPUTUM/BRONCH Berniece Andreas [[id number]]     Order Status:  No result Specimen:  Sputum         XR Results (most recent):  Results from Hospital Encounter encounter on 08/22/18   XR CHEST SNGL V    Narrative Clinical history: Shortness of breath    EXAMINATION:  Single view of the chest 08/22/2018    Correlation: Chest radiograph 08/04/2018 and CTA chest 02/18/2018    FINDINGS:  Trachea and heart size are within normal limits. Scarring/atelectasis left mid  to lower lung. 2 cm left lower lobe nodule unchanged from chest radiograph of  08/04/2018 and CTA chest 02/18/2018. Right lung is clear.      Impression IMPRESSION:   1. No acute pulmonary process.  2. 2 cm stable nodule left lower lobe unchanged from 08/04/2018 and evident on CT  scan 02/18/2018.           Imaging:  [x] I have personally reviewed the patient???s radiographs  [] Radiographs reviewed with radiologist   [] No change from prior, tubes and lines in adequate position  [] Improved   [] Worsening        Verlan Friends, MD

## 2018-08-24 NOTE — Other (Signed)
TRANSFER - IN REPORT:    Verbal report received from MIa(name) on [name] [name]  being received from [facility](unit) for routine progression of care      Report consisted of patient???s Situation, Background, Assessment and   Recommendations(SBAR).     Information from the following report(s) SBAR was reviewed with the receiving nurse.    Opportunity for questions and clarification was provided.      Assessment completed upon patient???s arrival to unit and care assumed.

## 2018-08-24 NOTE — Progress Notes (Signed)
Problem: Gas Exchange - Impaired  Goal: *Absence of hypoxia  Outcome: Progressing Towards Goal     Problem: Patient Education: Go to Patient Education Activity  Goal: Patient/Family Education  Outcome: Progressing Towards Goal

## 2018-08-24 NOTE — Progress Notes (Signed)
Hospitalist Progress Note         [facility]    Daily Progress Note: 08/24/2018    Assessment/Plan:   1.  Acute hypoxic respiratory failure, POA.  2.  Acute exacerbation COPD.  3.  Acute bronchitis  4.  HTN  5.  T2DM  6.  Left upper lobe lung nodule, will need continued follow-up   7.  Dyslipidemia  8.  PTSD, [place] was [place] [employer] [place], has had respiratory problems since.  Smoked very briefly in the distant past  9.  Peripheral neuropathy  10.  GERD.    11.  DJD  12.  DVT proph w sc Heparin  13.  Dispo: Improved. To floor.   Subjective:    SOB & wheezing a bit improved. Cough w white sputum. No C/P.       Cardiovascular ROS: no chest pain ,  orthopnea  Gastrointestinal ROS: no abdominal pain, nausea,vomiting, diarrhea, melena or hematechezia  Genito-Urinary ROS: no dysuria, trouble voiding, or hematuria  Objective:   Physical Exam:     Visit Vitals  BP (!) 149/107   Pulse 95   Temp 96.8 ??F (36 ??C)   Resp 18   Ht  (1.727 m)   Wt 92.8 kg (204 lb 9.4 oz)   SpO2 99%   BMI 31.11 kg/m??    O2 Flow Rate (L/min): 2 l/min O2 Device: Nasal cannula    Temp (24hrs), Avg:97.6 ??F (36.4 ??C), Min:96.8 ??F (36 ??C), Max:98.2 ??F (36.8 ??C)    03/13 0701 - 03/13 1900  In: -   Out: 340 [Urine:340]   03/11 1901 - 03/13 0700  In: 250 [I.V.:250]  Out: 3025 [Urine:3025]    General:  Alert, cooperative, no distress, appears stated age.   Lungs:    Less exp wheezes    :     Heart:  Regular rate and rhythm, S1, S2 normal, no murmur, click, rub or gallop.   Abdomen:   Soft, non-tender. Bowel sounds normal. No masses,  No organomegaly.   Extremities:  FROM, no LE edema   Pulses: 2+ and symmetric all extremities.   Skin: Skin color, texture, turgor normal. No rashes or lesions   :      Data Review:       24 Hour Results:  Recent Results (from the past 24 hour(s))   GLUCOSE, POC    Collection Time: 08/23/18  5:43 PM   Result Value Ref Range    Glucose (POC) 167 (H) 65 - 105 mg/dL   GLUCOSE, POC    Collection Time: 08/23/18   9:12 PM   Result Value Ref Range    Glucose (POC) 229 (H) 65 - 105 mg/dL   GLUCOSE, POC    Collection Time: 08/24/18  7:56 AM   Result Value Ref Range    Glucose (POC) 161 (H) 65 - 105 mg/dL   GLUCOSE, POC    Collection Time: 08/24/18 11:32 AM   Result Value Ref Range    Glucose (POC) 231 (H) 65 - 105 mg/dL       Problem List:  Problem List as of 08/24/2018 Date Reviewed: 28-Aug-2018          Codes Class Noted - Resolved    Respiratory failure with hypoxia [facility]) ICD-10-CM: J96.91  ICD-9-CM: 518.81  08/22/2018 - Present        Headache ICD-10-CM: R51  ICD-9-CM: 784.0  08/05/2018 - Present        Abdominal  pain ICD-10-CM: R10.9  ICD-9-CM: 789.00  02/04/2018 - Present        SIRS (systemic inflammatory response syndrome) (HCC) ICD-10-CM: R65.10  ICD-9-CM: 995.90  10/09/2017 - Present        Hyperkalemia ICD-10-CM: E87.5  ICD-9-CM: 276.7  09/08/2017 - Present        Obtunded ICD-10-CM: R40.1  ICD-9-CM: 780.09  09/08/2017 - Present        Acute renal failure (ARF) (HCC) ICD-10-CM: N17.9  ICD-9-CM: 584.9  09/08/2017 - Present        Septic shock (HCC) ICD-10-CM: A41.9, R65.21  ICD-9-CM: 038.9, 785.52, 995.92  09/08/2017 - Present        Metabolic encephalopathy ICD-10-CM: G93.41  ICD-9-CM: 348.31  09/08/2017 - Present        Dehydration ICD-10-CM: E86.0  ICD-9-CM: 276.51  08/07/2017 - Present        COPD with acute exacerbation (HCC) ICD-10-CM: J44.1  ICD-9-CM: 491.21  08/07/2017 - Present        Acute-on-chronic kidney injury (HCC) ICD-10-CM: N17.9, N18.9  ICD-9-CM: 584.9, 585.9  08/07/2017 - Present        Chest pain ICD-10-CM: R07.9  ICD-9-CM: 786.50  06/15/2017 - Present        Dyspnea ICD-10-CM: R06.00  ICD-9-CM: 786.09  06/15/2017 - Present        Type 2 diabetes mellitus with diabetic neuropathy (HCC) ICD-10-CM: E11.40  ICD-9-CM: 250.60, 357.2  12/07/2016 - Present        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  ICD-9-CM: 569.89  08/17/2016 - Present        Cannabinoid hyperemesis syndrome (HCC) ICD-10-CM: F12.988  ICD-9-CM: 536.2,  305.20  08/17/2016 - Present        Gastritis ICD-10-CM: K29.70  ICD-9-CM: 535.50  08/16/2016 - Present        Nausea & vomiting ICD-10-CM: R11.2  ICD-9-CM: 787.01  08/16/2016 - Present        Asthma with acute exacerbation ICD-10-CM: J45.901  ICD-9-CM: 493.92  08/04/2016 - Present        Lactic acidosis ICD-10-CM: E87.2  ICD-9-CM: 276.2  07/24/2016 - Present        Leukocytosis ICD-10-CM: D72.829  ICD-9-CM: 288.60  07/24/2016 - Present        Sepsis (HCC) ICD-10-CM: A41.9  ICD-9-CM: 038.9, 995.91  07/24/2016 - Present        Asthma exacerbation ICD-10-CM: J45.901  ICD-9-CM: 493.92  07/24/2016 - Present        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: E11.21  ICD-9-CM: 250.40, 583.81  06/12/2016 - Present        Sacroiliitis (HCC) ICD-10-CM: M46.1  ICD-9-CM: 720.2  11/25/2015 - Present        Essential hypertension ICD-10-CM: I10  ICD-9-CM: 401.9  10/06/2015 - Present        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  ICD-9-CM: 721.3  09/15/2015 - Present        Lumbar and sacral osteoarthritis ICD-10-CM: M47.817  ICD-9-CM: 721.3  09/15/2015 - Present        Chronic pain syndrome ICD-10-CM: G89.4  ICD-9-CM: 338.4  09/15/2015 - Present        Type 2 diabetes mellitus with hyperglycemia, without long-term current use of insulin (HCC) ICD-10-CM: E11.65  ICD-9-CM: 250.00, 790.29  08/20/2015 - Present        Acute colitis ICD-10-CM: K52.9  ICD-9-CM: 558.9  07/02/2015 - Present        Acute hyperglycemia ICD-10-CM: R73.9  ICD-9-CM:  790.29  07/02/2015 - Present        Accelerated hypertension ICD-10-CM: I10  ICD-9-CM: 401.0  04/08/2015 - Present        Severe headache ICD-10-CM: R51  ICD-9-CM: 784.0  04/07/2015 - Present        Osteoarthritis of hips, 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  ICD-9-CM: 715.95  01/29/2015 - Present        PTSD (post-traumatic stress disorder) (Chronic) ICD-10-CM: F43.10  ICD-9-CM: 309.81  01/29/2015 - Present        Severe hypertension (Chronic) ICD-10-CM: I10  ICD-9-CM:  401.9  07/21/2014 - Present        RESOLVED: COPD exacerbation (HCC) ICD-10-CM: J44.1  ICD-9-CM: 491.21  11/14/2017 - 08/08/2018        RESOLVED: New onset type 1 diabetes mellitus, uncontrolled (HCC) ICD-10-CM: E10.65  ICD-9-CM: 250.03  07/02/2015 - 10/06/2015        RESOLVED: Recurrent major depressive disorder, in full remission (HCC) ICD-10-CM: F33.42  ICD-9-CM: 296.36  12/02/2014 - 12/02/2014        RESOLVED: Foreign body in colon ICD-10-CM: T18.4XXA  ICD-9-CM: 936  08/14/2014 - 01/29/2015        RESOLVED: Advanced care planning/counseling discussion ICD-10-CM: Z71.89  ICD-9-CM: V65.49  07/21/2014 - 01/29/2015    Overview Signed 07/21/2014  3:46 PM by Littie Deeds     Patient given [place] application               RESOLVED: Hip pain, chronic ICD-10-CM: M25.559, G89.29  ICD-9-CM: 719.45, 338.29  07/21/2014 - 01/29/2015               Medications reviewed  Current Facility-Administered Medications   Medication Dose Route Frequency   ??? cloNIDine HCL (CATAPRES) tablet 0.3 mg  0.3 mg Oral BID   ??? predniSONE (DELTASONE) tablet 40 mg  40 mg Oral DAILY WITH BREAKFAST   ??? azithromycin (ZITHROMAX) tablet 500 mg  500 mg Oral DAILY   ??? revefenacin (YUPELRI) nebulizer solution 175 mcg  175 mcg Nebulization DAILY   ??? dextromethorphan (DELSYM) 30 mg/5 mL syrup 30 mg  30 mg Oral Q12H PRN   ??? dilTIAZem CD (CARDIZEM CD) capsule 180 mg  180 mg Oral DAILY   ??? gabapentin (NEURONTIN) capsule 300 mg  300 mg Oral TID   ??? losartan (COZAAR) tablet 12.5 mg  12.5 mg Oral DAILY   ??? montelukast (SINGULAIR) tablet 10 mg  10 mg Oral QHS   ??? naloxone (NARCAN) injection 0.1 mg  0.1 mg IntraVENous PRN   ??? acetaminophen (TYLENOL) tablet 650 mg  650 mg Oral Q4H PRN    Or   ??? acetaminophen (TYLENOL) solution 650 mg  650 mg Oral Q4H PRN    Or   ??? acetaminophen (TYLENOL) suppository 650 mg  650 mg Rectal Q4H PRN   ??? HYDROcodone-acetaminophen (NORCO) 5-325 mg per tablet 1 Tab  1 Tab Oral Q4H PRN   ??? ondansetron (ZOFRAN) injection 4 mg  4 mg IntraVENous  Q4H PRN   ??? MAGNESIUM ELECTROLYTE REPLACEMENT PROTOCOL STANDARD DOSING  1 Each Other PRN   ??? POTASSIUM ELECTROLYTE REPLACEMENT PROTOCOL STANDARD DOSING  1 Each Other PRN   ??? CALCIUM ELECTROLYTE REPLACEMENT PROTOCOL STANDARD DOSING  1 Each Other PRN   ??? PHOSPHATE ELECTROLYTE REPLACEMENT PROTOCOL STANDARD DOSING  1 Each Other PRN   ??? heparin (porcine) injection 5,000 Units  5,000 Units SubCUTAneous Q8H   ??? arformoteroL (BROVANA) neb solution 15 mcg  15 mcg Nebulization BID RT   ???  budesonide (PULMICORT) 500 mcg/2 ml nebulizer suspension  500 mcg Nebulization BID RT   ??? albuterol (PROVENTIL VENTOLIN) nebulizer solution 2.5 mg  2.5 mg Nebulization Q2H PRN   ??? albuterol-ipratropium (DUO-NEB) 2.5 MG-0.5 MG/3 ML  3 mL Nebulization Q4H RT   ??? dextrose (D50W) injection syrg 5-25 g  10-50 mL IntraVENous PRN   ??? glucagon (GLUCAGEN) injection 1 mg  1 mg IntraMUSCular PRN   ??? insulin glargine (LANTUS) injection 1-100 Units  1-100 Units SubCUTAneous QHS   ??? insulin lispro (HUMALOG) injection 1-100 Units  1-100 Units SubCUTAneous AC&HS   ??? insulin lispro (HUMALOG) injection 1-100 Units  1-100 Units SubCUTAneous PRN        Care Plan discussed with: pt     Total time spent with patient: 35 minutes.    Dorothe Pea, MD  August 24, 2018  [id number]

## 2018-08-24 NOTE — Progress Notes (Signed)
Progress Notes by Verlan Friends, MD at 08/24/18 [phone]                Author: Verlan Friends, MD  Service: Pulmonary Disease  Author Type: Physician       Filed: 08/24/18 1024  Date of Service: 08/24/18 0946  Status: Signed          Editor: Verlan Friends, MD (Physician)                       [facility] PULMONARY AND CRITICAL CARE MEDICINE            Name:  [name] [name]  MRN:  [id number]          DOB:  11-22-1950  Hospital:  [facility]     Date:  08/24/2018               IMPRESSION:     ??    Worsening shortness of breath, shortness of breath, and wheezing due to COPD with exacerbation   ??  COPD with exacerbation   ??  GERD   ??  PTSD              PLAN:     ??    Continue BiPAP with current settings (15/8/30%) on regular basis during night time, and as needed basis during daytime,.  Titrate  FiO2, and target oxygen saturation greater than 88%.   ??  Continue  Brovana and Pulmicort per nebulizer twice a day.  Continue  Yupelri through nebulizer once a day.   ??  Switch to PO prednisone 40 mg once a day.  Continue Singulair once a day.   ??  Switch to po azithromycin for acute bronchitis.  Send a sputum Gram stain and culture   ??  Continue subcu heparin for DVT prophylaxis   ??  Will sign off           Subjective/Interval History:              Review of Systems    Constitutional: Negative.     HENT: Negative.     Eyes: Negative.     Respiratory: Positive for cough.     Cardiovascular: Negative.     Gastrointestinal: Negative.     Genitourinary: Negative.     Musculoskeletal: Negative.     Skin: Negative.     Neurological: Negative.     Endo/Heme/Allergies: Negative.     Psychiatric/Behavioral: Negative.             Objective:     Vital Signs:       Visit Vitals   BP  (!) 149/107      Pulse  95      Temp  96.8 ??F (36 ??C)      Resp  18      Ht  5\' 8"  (1.727 m)      Wt  92.8 kg (204 lb 9.4 oz)      SpO2  100%      BMI  31.11 kg/m??                O2 Device: Nasal cannula     O2 Flow Rate  (L/min): 2 l/min     Temp (24hrs), Avg:97.7 ??F (36.5 ??C), Min:96.8 ??F (36 ??C), Max:98.2 ??F (36.8 ??C)           Intake/Output:    Last shift:      No intake/output data recorded.  Last 3 shifts: 03/11 1901 - 03/13 0700   In: 250 [I.V.:250]   Out: 3025 [Urine:3025]      Intake/Output Summary (Last 24 hours) at 08/24/2018 0946   Last data filed at 08/24/2018 0549     Gross per 24 hour        Intake  250 ml        Output  2025 ml        Net  -1775 ml            Physical Exam   Vitals signs and nursing note reviewed.   Constitutional:        Appearance: Normal appearance.   HENT :       Head: Normocephalic and atraumatic.      Mouth/Throat:      Mouth: Mucous membranes are moist.    Cardiovascular:       Rate and Rhythm: Normal rate and regular rhythm.   Pulmonary :       Effort: Pulmonary effort is normal.      Breath sounds: Wheezing present.   Abdominal :      General: Bowel sounds are normal.      Tenderness: There is no abdominal tenderness.     Musculoskeletal:          General: No swelling.    Skin:      General: Skin is warm and dry.   Neurological :       General: No focal deficit present.      Mental Status: She is alert and oriented to person, place, and time.    Psychiatric:         Mood and Affect: Mood normal.                  DATA:   Labs:     Recent Labs            08/23/18   0519  08/22/18   1410     WBC  5.8  12.3*     HGB  12.4*  12.7*     HCT  38.9  39.0         PLT  178  200          Recent Labs             08/23/18   0519  08/22/18   1440  08/22/18   1410     NA  140   --   139     K  4.5   --   3.9     CL  106   --   103     CO2  27  29.0  29     GLU  172*   --   95     BUN  20   --   19     CREA  0.9   --   1.0     CA  9.3   --   9.2     ALB   --    --   3.3*     SGOT   --    --   7*          ALT   --    --   15          Recent Labs           08/22/18   1440     PH  7.400  PCO2  44.1     PO2  82.0     HCO3  27.4*        FIO2  30                 Results               Procedure  Component  Value   Units  Date/Time           CULTURE, RESPIRATORY/SPUTUM/BRONCH Berniece Andreas [[id number]]              Order Status:  No result  Specimen:  Sputum               XR Results (most recent):     Results from Hospital Encounter encounter on 08/22/18     XR CHEST SNGL V           Narrative  Clinical history: Shortness of breath      EXAMINATION:   Single view of the chest 08/22/2018      Correlation: Chest radiograph 08/04/2018 and CTA chest 02/18/2018      FINDINGS:   Trachea and heart size are within normal limits. Scarring/atelectasis left mid   to lower lung. 2 cm left lower lobe nodule unchanged from chest radiograph of   08/04/2018 and CTA chest 02/18/2018. Right lung is clear.              Impression  IMPRESSION:   1. No acute pulmonary process.   2. 2 cm stable nodule left lower lobe unchanged from 08/04/2018 and evident on CT   scan 02/18/2018.                 Imaging:   [x]  I have personally reviewed the patients radiographs   []  Radiographs reviewed with radiologist    []  No change from prior, tubes and lines in adequate position   [] Improved   [] Worsening            Verlan Friends, MD

## 2018-08-25 LAB — POCT GLUCOSE
POC Glucose: 257 mg/dL — ABNORMAL HIGH (ref 65–105)
POC Glucose: 118 mg/dL — ABNORMAL HIGH (ref 65–105)
POC Glucose: 118 mg/dL — ABNORMAL HIGH (ref 65–105)
POC Glucose: 131 mg/dL — ABNORMAL HIGH (ref 65–105)

## 2018-08-25 LAB — GLUCOSE, POC
Glucose (POC): 257 mg/dL — ABNORMAL HIGH (ref 65–105)
Glucose (POC): 118 mg/dL — ABNORMAL HIGH (ref 65–105)
Glucose (POC): 118 mg/dL — ABNORMAL HIGH (ref 65–105)
Glucose (POC): 131 mg/dL — ABNORMAL HIGH (ref 65–105)

## 2018-08-25 MED ORDER — ACETYLCYSTEINE 20 % (200 MG/ML) SOLN
200 mg/mL (20 %) | Freq: Three times a day (TID) | Status: DC
Start: 2018-08-25 — End: 2018-08-26
  Administered 2018-08-25 – 2018-08-26 (×3): via RESPIRATORY_TRACT

## 2018-08-25 MED ORDER — HYDROXYZINE 25 MG TAB
25 mg | Freq: Once | ORAL | Status: AC
Start: 2018-08-25 — End: 2018-08-25
  Administered 2018-08-25: 23:00:00 via ORAL

## 2018-08-25 MED ORDER — ALBUTEROL SULFATE 0.083 % (0.83 MG/ML) SOLN FOR INHALATION
2.5 mg /3 mL (0.083 %) | RESPIRATORY_TRACT | Status: DC
Start: 2018-08-25 — End: 2018-08-28
  Administered 2018-08-25 – 2018-08-28 (×17): via RESPIRATORY_TRACT

## 2018-08-25 MED FILL — HEPARIN (PORCINE) 5,000 UNIT/ML IJ SOLN: 5000 unit/mL | INTRAMUSCULAR | Qty: 1

## 2018-08-25 MED FILL — SEROQUEL XR 50 MG TABLET,EXTENDED RELEASE: 50 mg | ORAL | Qty: 3

## 2018-08-25 MED FILL — INSULIN GLARGINE 100 UNIT/ML INJECTION: 100 unit/mL | SUBCUTANEOUS | Qty: 1

## 2018-08-25 MED FILL — ALBUTEROL SULFATE 0.083 % (0.83 MG/ML) SOLN FOR INHALATION: 2.5 mg /3 mL (0.083 %) | RESPIRATORY_TRACT | Qty: 1

## 2018-08-25 MED FILL — YUPELRI 175 MCG/3 ML SOLUTION FOR NEBULIZATION: 175 mcg/3 mL | RESPIRATORY_TRACT | Qty: 1

## 2018-08-25 MED FILL — GABAPENTIN 300 MG CAP: 300 mg | ORAL | Qty: 1

## 2018-08-25 MED FILL — HYDROCODONE-ACETAMINOPHEN 5 MG-325 MG TAB: 5-325 mg | ORAL | Qty: 1

## 2018-08-25 MED FILL — LOSARTAN 25 MG TAB: 25 mg | ORAL | Qty: 1

## 2018-08-25 MED FILL — DILTIAZEM ER 180 MG 24 HR CAP: 180 mg | ORAL | Qty: 1

## 2018-08-25 MED FILL — MONTELUKAST 10 MG TAB: 10 mg | ORAL | Qty: 1

## 2018-08-25 MED FILL — CLONIDINE 0.1 MG TAB: 0.1 mg | ORAL | Qty: 3

## 2018-08-25 MED FILL — BUDESONIDE 0.5 MG/2 ML NEB SUSPENSION: 0.5 mg/2 mL | RESPIRATORY_TRACT | Qty: 1

## 2018-08-25 MED FILL — QUETIAPINE SR 50 MG 24 HR TAB: 50 mg | ORAL | Qty: 3

## 2018-08-25 MED FILL — PREDNISONE 20 MG TAB: 20 mg | ORAL | Qty: 2

## 2018-08-25 MED FILL — ACETAMINOPHEN 325 MG TABLET: 325 mg | ORAL | Qty: 2

## 2018-08-25 MED FILL — BROVANA 15 MCG/2 ML SOLUTION FOR NEBULIZATION: 15 mcg/2 mL | RESPIRATORY_TRACT | Qty: 1

## 2018-08-25 MED FILL — HYDROXYZINE 25 MG TAB: 25 mg | ORAL | Qty: 2

## 2018-08-25 MED FILL — KETOROLAC TROMETHAMINE 15 MG/ML INJECTION: 15 mg/mL | INTRAMUSCULAR | Qty: 1

## 2018-08-25 MED FILL — INSULIN LISPRO 100 UNIT/ML INJECTION: 100 unit/mL | SUBCUTANEOUS | Qty: 3

## 2018-08-25 MED FILL — ACETYLCYSTEINE 20 % (200 MG/ML) SOLN: 200 mg/mL (20 %) | Qty: 4

## 2018-08-25 MED FILL — AZITHROMYCIN 250 MG TAB: 250 mg | ORAL | Qty: 2

## 2018-08-25 NOTE — Other (Signed)
Bedside and Verbal shift change report given to Emily A Houle  (oncoming nurse) by Cyndy (offgoing nurse). Report included the following information SBAR and Kardex.

## 2018-08-25 NOTE — Other (Signed)
Bedside shift change report given to Cyndy Clare F Domingo, RN   (oncoming nurse) by Paz RN (offgoing nurse). Report included the following information SBAR.

## 2018-08-25 NOTE — Progress Notes (Signed)
PAGER ID: [phone]   MESSAGE: [phone] Betley - DX: Resp Failure. Pt very anxious. Pls order anti anxiety medicine doc pls -cindy [phone]

## 2018-08-25 NOTE — Progress Notes (Addendum)
PAGER ID: [phone]   MESSAGE: [phone] Juarbe is still anxious. Pls order PRN thank you [phone] cindy    -MD ordered Atarax 50mg once
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
## 2018-08-25 NOTE — Progress Notes (Signed)
Pt insists to use purewick and diaper

## 2018-08-25 NOTE — Progress Notes (Signed)
Hospitalist Progress Note         [facility]    Daily Progress Note: 08/25/2018    Assessment/Plan:   1.  Acute hypoxic respiratory failure, POA.  2.  Acute exacerbation COPD. Try adding Mucomyst  3.  Acute bronchitis  4.  HTN  5.  T2DM  6.  Left upper lobe lung nodule, will need continued follow-up   7.  Dyslipidemia  8.  PTSD, she was [place], has had respiratory problems since.  Smoked very briefly in the distant past  9.  Peripheral neuropathy  10.  GERD.    11.  DJD  12.  DVT proph w sc Heparin  13.  Dispo: Still probably needs a couple of days  Subjective:   Still SOB. Cough w thick white sputum. No C/P.      Cardiovascular ROS: no chest pain ,  orthopnea  Gastrointestinal ROS: no abdominal pain, nausea,vomiting, diarrhea, melena or hematechezia  Genito-Urinary ROS: no dysuria, trouble voiding, or hematuria  Objective:   Physical Exam:     Visit Vitals  BP 122/84 (BP 1 Location: Right arm, BP Patient Position: Supine)   Pulse 89   Temp 98 ??F (36.7 ??C)   Resp 20   Ht  (1.727 m)   Wt 90.2 kg (198 lb 13.7 oz)   SpO2 96%   BMI 30.24 kg/m??    O2 Flow Rate (L/min): 3 l/min O2 Device: Nasal cannula    Temp (24hrs), Avg:98 ??F (36.7 ??C), Min:97.2 ??F (36.2 ??C), Max:98.6 ??F (37 ??C)    03/14 0701 - 03/14 1900  In: 260 [P.O.:260]  Out: -    03/12 1901 - 03/14 0700  In: 200 [P.O.:200]  Out: 1790 [Urine:1790]    General:  Alert, cooperative, no distress, appears stated age.   Lungs:    Still significant exp wheezes    :     Heart:  Regular rate and rhythm, S1, S2 normal, no murmur, click, rub or gallop.   Abdomen:   Soft, non-tender. Bowel sounds normal. No masses,  No organomegaly.   Extremities:  FROM, no LE edema   Pulses: 2+ and symmetric all extremities.   Skin: Skin color, texture, turgor normal. No rashes or lesions   :      Data Review:       24 Hour Results:  Recent Results (from the past 24 hour(s))   CULTURE, RESPIRATORY/SPUTUM/BRONCH W GRAM STAIN     Collection Time: 08/24/18  3:54 PM   Result Value Ref Range    GRAM STAIN (A)       Rare Gram Positive Cocci In Clusters  Rare Gram Positive Bacilli  Few Gram Positive Cocci In Chains  <10 Epithelial cells/lpf  10 - 25 WBC's/lpf  Mucus Present     GLUCOSE, POC    Collection Time: 08/24/18  5:22 PM   Result Value Ref Range    Glucose (POC) 140 (H) 65 - 105 mg/dL   GLUCOSE, POC    Collection Time: 08/24/18 10:15 PM   Result Value Ref Range    Glucose (POC) 257 (H) 65 - 105 mg/dL   GLUCOSE, POC    Collection Time: 08/25/18  7:55 AM   Result Value Ref Range    Glucose (POC) 118 (H) 65 - 105 mg/dL   GLUCOSE, POC    Collection Time: 08/25/18 11:42 AM   Result Value Ref Range    Glucose (POC) 118 (H) 65 -  105 mg/dL       Problem List:  Problem List as of 08/25/2018 Date Reviewed: Aug 13, 2018          Codes Class Noted - Resolved    Respiratory failure with hypoxia [facility]) ICD-10-CM: J96.91  ICD-9-CM: 518.81  08/22/2018 - Present        Headache ICD-10-CM: R51  ICD-9-CM: 784.0  08/05/2018 - Present        Abdominal pain ICD-10-CM: R10.9  ICD-9-CM: 789.00  02/04/2018 - Present        SIRS (systemic inflammatory response syndrome) (HCC) ICD-10-CM: R65.10  ICD-9-CM: 995.90  10/09/2017 - Present        Hyperkalemia ICD-10-CM: E87.5  ICD-9-CM: 276.7  09/08/2017 - Present        Obtunded ICD-10-CM: R40.1  ICD-9-CM: 780.09  09/08/2017 - Present        Acute renal failure (ARF) (HCC) ICD-10-CM: N17.9  ICD-9-CM: 584.9  09/08/2017 - Present        Septic shock (HCC) ICD-10-CM: A41.9, R65.21  ICD-9-CM: 038.9, 785.52, 995.92  09/08/2017 - Present        Metabolic encephalopathy ICD-10-CM: G93.41  ICD-9-CM: 348.31  09/08/2017 - Present        Dehydration ICD-10-CM: E86.0  ICD-9-CM: 276.51  08/07/2017 - Present        COPD with acute exacerbation (HCC) ICD-10-CM: J44.1  ICD-9-CM: 491.21  08/07/2017 - Present        Acute-on-chronic kidney injury (HCC) ICD-10-CM: N17.9, N18.9  ICD-9-CM: 584.9, 585.9  08/07/2017 - Present         Chest pain ICD-10-CM: R07.9  ICD-9-CM: 786.50  06/15/2017 - Present        Dyspnea ICD-10-CM: R06.00  ICD-9-CM: 786.09  06/15/2017 - Present        Type 2 diabetes mellitus with diabetic neuropathy (HCC) ICD-10-CM: E11.40  ICD-9-CM: 250.60, 357.2  12/07/2016 - Present        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9  ICD-9-CM: 038.9, 995.91  07/24/2016 - Present        Asthma exacerbation ICD-10-CM: J45.901  ICD-9-CM: 493.92  07/24/2016 - Present        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: E11.21  ICD-9-CM: 250.40, 583.81  06/12/2016 - Present        Sacroiliitis (HCC) ICD-10-CM: M46.1  ICD-9-CM: 720.2  11/25/2015 - Present        Essential hypertension ICD-10-CM: I10  ICD-9-CM: 401.9  10/06/2015 - Present        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  ICD-9-CM: 721.3  09/15/2015 - Present        Lumbar and sacral osteoarthritis ICD-10-CM: M47.817  ICD-9-CM: 721.3  09/15/2015 - Present  Chronic pain syndrome ICD-10-CM: G89.4  ICD-9-CM: 338.4  09/15/2015 - Present        Type 2 diabetes mellitus with hyperglycemia, without long-term current use of insulin (HCC) ICD-10-CM: E11.65  ICD-9-CM: 250.00, 790.29  08/20/2015 - Present        Acute colitis ICD-10-CM: K52.9  ICD-9-CM: 558.9  07/02/2015 - Present        Acute hyperglycemia ICD-10-CM: R73.9   ICD-9-CM: 790.29  07/02/2015 - Present        Accelerated hypertension ICD-10-CM: I10  ICD-9-CM: 401.0  04/08/2015 - Present        Severe headache ICD-10-CM: R51  ICD-9-CM: 784.0  04/07/2015 - Present        Osteoarthritis of hips, 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  ICD-9-CM: 715.95  01/29/2015 - Present        PTSD (post-traumatic stress disorder) (Chronic) ICD-10-CM: F43.10  ICD-9-CM: 309.81  01/29/2015 - Present        Severe hypertension (Chronic) ICD-10-CM: I10  ICD-9-CM: 401.9  07/21/2014 - Present        RESOLVED: COPD exacerbation (HCC) ICD-10-CM: J44.1  ICD-9-CM: 491.21  11/14/2017 - 08/08/2018        RESOLVED: New onset type 1 diabetes mellitus, uncontrolled (HCC) ICD-10-CM: E10.65  ICD-9-CM: 250.03  07/02/2015 - 10/06/2015        RESOLVED: Recurrent major depressive disorder, in full remission (HCC) ICD-10-CM: F33.42  ICD-9-CM: 296.36  12/02/2014 - 12/02/2014        RESOLVED: Foreign body in colon ICD-10-CM: T18.4XXA  ICD-9-CM: 936  08/14/2014 - 01/29/2015        RESOLVED: Advanced care planning/counseling discussion ICD-10-CM: Z71.89  ICD-9-CM: V65.49  07/21/2014 - 01/29/2015    Overview Signed 07/21/2014  3:46 PM by Littie Deeds     Patient given [place] application               RESOLVED: Hip pain, chronic ICD-10-CM: M25.559, G89.29  ICD-9-CM: 719.45, 338.29  07/21/2014 - 01/29/2015               Medications reviewed  Current Facility-Administered Medications   Medication Dose Route Frequency   ??? cloNIDine HCL (CATAPRES) tablet 0.3 mg  0.3 mg Oral BID   ??? predniSONE (DELTASONE) tablet 40 mg  40 mg Oral DAILY WITH BREAKFAST   ??? azithromycin (ZITHROMAX) tablet 500 mg  500 mg Oral DAILY   ??? albuterol (PROVENTIL VENTOLIN) nebulizer solution 2.5 mg  2.5 mg Nebulization Q6H RT   ??? ketorolac (TORADOL) injection 15 mg  15 mg IntraVENous Q6H PRN   ??? QUEtiapine SR (SEROquel XR) tablet 150 mg  150 mg Oral QHS   ??? revefenacin (YUPELRI) nebulizer solution 175 mcg  175 mcg Nebulization DAILY    ??? dextromethorphan (DELSYM) 30 mg/5 mL syrup 30 mg  30 mg Oral Q12H PRN   ??? dilTIAZem CD (CARDIZEM CD) capsule 180 mg  180 mg Oral DAILY   ??? gabapentin (NEURONTIN) capsule 300 mg  300 mg Oral TID   ??? losartan (COZAAR) tablet 12.5 mg  12.5 mg Oral DAILY   ??? montelukast (SINGULAIR) tablet 10 mg  10 mg Oral QHS   ??? naloxone (NARCAN) injection 0.1 mg  0.1 mg IntraVENous PRN   ??? acetaminophen (TYLENOL) tablet 650 mg  650 mg Oral Q4H PRN    Or   ??? acetaminophen (TYLENOL) solution 650 mg  650 mg Oral Q4H PRN    Or   ??? acetaminophen (TYLENOL) suppository 650 mg  650 mg Rectal Q4H  PRN   ??? HYDROcodone-acetaminophen (NORCO) 5-325 mg per tablet 1 Tab  1 Tab Oral Q4H PRN   ??? ondansetron (ZOFRAN) injection 4 mg  4 mg IntraVENous Q4H PRN   ??? heparin (porcine) injection 5,000 Units  5,000 Units SubCUTAneous Q8H   ??? arformoteroL (BROVANA) neb solution 15 mcg  15 mcg Nebulization BID RT   ??? budesonide (PULMICORT) 500 mcg/2 ml nebulizer suspension  500 mcg Nebulization BID RT   ??? albuterol (PROVENTIL VENTOLIN) nebulizer solution 2.5 mg  2.5 mg Nebulization Q2H PRN   ??? dextrose (D50W) injection syrg 5-25 g  10-50 mL IntraVENous PRN   ??? glucagon (GLUCAGEN) injection 1 mg  1 mg IntraMUSCular PRN   ??? insulin glargine (LANTUS) injection 1-100 Units  1-100 Units SubCUTAneous QHS   ??? insulin lispro (HUMALOG) injection 1-100 Units  1-100 Units SubCUTAneous AC&HS   ??? insulin lispro (HUMALOG) injection 1-100 Units  1-100 Units SubCUTAneous PRN        Care Plan discussed with: pt     Total time spent with patient: 25 minutes.    Dorothe Pea, MD  August 25, 2018  1305
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
## 2018-08-25 NOTE — Progress Notes (Signed)
Hospitalist Progress Note         [facility]    Daily Progress Note: 08/25/2018    Assessment/Plan:   1.  Acute hypoxic respiratory failure, POA.  2.  Acute exacerbation COPD. Try adding Mucomyst  3.  Acute bronchitis  4.  HTN  5.  T2DM  6.  Left upper lobe lung nodule, will need continued follow-up   7.  Dyslipidemia  8.  PTSD, she was in [place] [employer] on 9[place], has had respiratory problems since.  Smoked very briefly in the distant past  9.  Peripheral neuropathy  10.  GERD.    11.  DJD  12.  DVT proph w sc Heparin  13.  Dispo: Still probably needs a couple of days  Subjective:   Still SOB. Cough w thick white sputum. No C/P.      Cardiovascular ROS: no chest pain ,  orthopnea  Gastrointestinal ROS: no abdominal pain, nausea,vomiting, diarrhea, melena or hematechezia  Genito-Urinary ROS: no dysuria, trouble voiding, or hematuria  Objective:   Physical Exam:     Visit Vitals  BP 122/84 (BP 1 Location: Right arm, BP Patient Position: Supine)   Pulse 89   Temp 98 ??F (36.7 ??C)   Resp 20   Ht  (1.727 m)   Wt 90.2 kg (198 lb 13.7 oz)   SpO2 96%   BMI 30.24 kg/m??    O2 Flow Rate (L/min): 3 l/min O2 Device: Nasal cannula    Temp (24hrs), Avg:98 ??F (36.7 ??C), Min:97.2 ??F (36.2 ??C), Max:98.6 ??F (37 ??C)    03/14 0701 - 03/14 1900  In: 260 [P.O.:260]  Out: -    03/12 1901 - 03/14 0700  In: 200 [P.O.:200]  Out: 1790 [Urine:1790]    General:  Alert, cooperative, no distress, appears stated age.   Lungs:    Still significant exp wheezes    :     Heart:  Regular rate and rhythm, S1, S2 normal, no murmur, click, rub or gallop.   Abdomen:   Soft, non-tender. Bowel sounds normal. No masses,  No organomegaly.   Extremities:  FROM, no LE edema   Pulses: 2+ and symmetric all extremities.   Skin: Skin color, texture, turgor normal. No rashes or lesions   :      Data Review:       24 Hour Results:  Recent Results (from the past 24 hour(s))   CULTURE, RESPIRATORY/SPUTUM/BRONCH W GRAM STAIN    Collection Time: 08/24/18   3:54 PM   Result Value Ref Range    GRAM STAIN (A)       Rare Gram Positive Cocci In Clusters  Rare Gram Positive Bacilli  Few Gram Positive Cocci In Chains  <10 Epithelial cells/lpf  10 - 25 WBC's/lpf  Mucus Present     GLUCOSE, POC    Collection Time: 08/24/18  5:22 PM   Result Value Ref Range    Glucose (POC) 140 (H) 65 - 105 mg/dL   GLUCOSE, POC    Collection Time: 08/24/18 10:15 PM   Result Value Ref Range    Glucose (POC) 257 (H) 65 - 105 mg/dL   GLUCOSE, POC    Collection Time: 08/25/18  7:55 AM   Result Value Ref Range    Glucose (POC) 118 (H) 65 - 105 mg/dL   GLUCOSE, POC    Collection Time: 08/25/18 11:42 AM   Result Value Ref Range    Glucose (POC) 118 (H) 65 -  105 mg/dL       Problem List:  Problem List as of 08/25/2018 Date Reviewed: 2018/08/18          Codes Class Noted - Resolved    Respiratory failure with hypoxia [facility]) ICD-10-CM: J96.91  ICD-9-CM: 518.81  08/22/2018 - Present        Headache ICD-10-CM: R51  ICD-9-CM: 784.0  08/05/2018 - Present        Abdominal pain ICD-10-CM: R10.9  ICD-9-CM: 789.00  02/04/2018 - Present        SIRS (systemic inflammatory response syndrome) (HCC) ICD-10-CM: R65.10  ICD-9-CM: 995.90  10/09/2017 - Present        Hyperkalemia ICD-10-CM: E87.5  ICD-9-CM: 276.7  09/08/2017 - Present        Obtunded ICD-10-CM: R40.1  ICD-9-CM: 780.09  09/08/2017 - Present        Acute renal failure (ARF) (HCC) ICD-10-CM: N17.9  ICD-9-CM: 584.9  09/08/2017 - Present        Septic shock (HCC) ICD-10-CM: A41.9, R65.21  ICD-9-CM: 038.9, 785.52, 995.92  09/08/2017 - Present        Metabolic encephalopathy ICD-10-CM: G93.41  ICD-9-CM: 348.31  09/08/2017 - Present        Dehydration ICD-10-CM: E86.0  ICD-9-CM: 276.51  08/07/2017 - Present        COPD with acute exacerbation (HCC) ICD-10-CM: J44.1  ICD-9-CM: 491.21  08/07/2017 - Present        Acute-on-chronic kidney injury (HCC) ICD-10-CM: N17.9, N18.9  ICD-9-CM: 584.9, 585.9  08/07/2017 - Present        Chest pain ICD-10-CM: R07.9  ICD-9-CM: 786.50   06/15/2017 - Present        Dyspnea ICD-10-CM: R06.00  ICD-9-CM: 786.09  06/15/2017 - Present        Type 2 diabetes mellitus with diabetic neuropathy (HCC) ICD-10-CM: E11.40  ICD-9-CM: 250.60, 357.2  12/07/2016 - Present        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9  ICD-9-CM: 038.9, 995.91  07/24/2016 - Present        Asthma exacerbation ICD-10-CM: J45.901  ICD-9-CM: 493.92  07/24/2016 - Present        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: E11.21  ICD-9-CM: 250.40, 583.81  06/12/2016 - Present        Sacroiliitis (HCC) ICD-10-CM: M46.1  ICD-9-CM: 720.2  11/25/2015 - Present        Essential hypertension ICD-10-CM: I10  ICD-9-CM: 401.9  10/06/2015 - Present        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  ICD-9-CM: 721.3  09/15/2015 - Present        Lumbar and sacral osteoarthritis ICD-10-CM: M47.817  ICD-9-CM: 721.3  09/15/2015 - Present  Chronic pain syndrome ICD-10-CM: G89.4  ICD-9-CM: 338.4  09/15/2015 - Present        Type 2 diabetes mellitus with hyperglycemia, without long-term current use of insulin (HCC) ICD-10-CM: E11.65  ICD-9-CM: 250.00, 790.29  08/20/2015 - Present        Acute colitis ICD-10-CM: K52.9  ICD-9-CM: 558.9  07/02/2015 - Present        Acute hyperglycemia ICD-10-CM: R73.9  ICD-9-CM: 790.29  07/02/2015 - Present        Accelerated hypertension ICD-10-CM:  I10  ICD-9-CM: 401.0  04/08/2015 - Present        Severe headache ICD-10-CM: R51  ICD-9-CM: 784.0  04/07/2015 - Present        Osteoarthritis of hips, 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  ICD-9-CM: 715.95  01/29/2015 - Present        PTSD (post-traumatic stress disorder) (Chronic) ICD-10-CM: F43.10  ICD-9-CM: 309.81  01/29/2015 - Present        Severe hypertension (Chronic) ICD-10-CM: I10  ICD-9-CM: 401.9  07/21/2014 - Present        RESOLVED: COPD exacerbation (HCC) ICD-10-CM: J44.1  ICD-9-CM: 491.21  11/14/2017 - 08/08/2018        RESOLVED: New onset type 1 diabetes mellitus, uncontrolled (HCC) ICD-10-CM: E10.65  ICD-9-CM: 250.03  07/02/2015 - 10/06/2015        RESOLVED: Recurrent major depressive disorder, in full remission (HCC) ICD-10-CM: F33.42  ICD-9-CM: 296.36  12/02/2014 - 12/02/2014        RESOLVED: Foreign body in colon ICD-10-CM: T18.4XXA  ICD-9-CM: 936  08/14/2014 - 01/29/2015        RESOLVED: Advanced care planning/counseling discussion ICD-10-CM: Z71.89  ICD-9-CM: V65.49  07/21/2014 - 01/29/2015    Overview Signed 07/21/2014  3:46 PM by Littie Deeds     Patient given [place] application               RESOLVED: Hip pain, chronic ICD-10-CM: M25.559, G89.29  ICD-9-CM: 719.45, 338.29  07/21/2014 - 01/29/2015               Medications reviewed  Current Facility-Administered Medications   Medication Dose Route Frequency   ??? cloNIDine HCL (CATAPRES) tablet 0.3 mg  0.3 mg Oral BID   ??? predniSONE (DELTASONE) tablet 40 mg  40 mg Oral DAILY WITH BREAKFAST   ??? azithromycin (ZITHROMAX) tablet 500 mg  500 mg Oral DAILY   ??? albuterol (PROVENTIL VENTOLIN) nebulizer solution 2.5 mg  2.5 mg Nebulization Q6H RT   ??? ketorolac (TORADOL) injection 15 mg  15 mg IntraVENous Q6H PRN   ??? QUEtiapine SR (SEROquel XR) tablet 150 mg  150 mg Oral QHS   ??? revefenacin (YUPELRI) nebulizer solution 175 mcg  175 mcg Nebulization DAILY   ??? dextromethorphan (DELSYM) 30 mg/5 mL syrup 30 mg  30 mg Oral Q12H PRN   ??? dilTIAZem CD (CARDIZEM CD) capsule 180  mg  180 mg Oral DAILY   ??? gabapentin (NEURONTIN) capsule 300 mg  300 mg Oral TID   ??? losartan (COZAAR) tablet 12.5 mg  12.5 mg Oral DAILY   ??? montelukast (SINGULAIR) tablet 10 mg  10 mg Oral QHS   ??? naloxone (NARCAN) injection 0.1 mg  0.1 mg IntraVENous PRN   ??? acetaminophen (TYLENOL) tablet 650 mg  650 mg Oral Q4H PRN    Or   ??? acetaminophen (TYLENOL) solution 650 mg  650 mg Oral Q4H PRN    Or   ??? acetaminophen (TYLENOL) suppository 650 mg  650 mg Rectal Q4H  PRN   ??? HYDROcodone-acetaminophen (NORCO) 5-325 mg per tablet 1 Tab  1 Tab Oral Q4H PRN   ??? ondansetron (ZOFRAN) injection 4 mg  4 mg IntraVENous Q4H PRN   ??? heparin (porcine) injection 5,000 Units  5,000 Units SubCUTAneous Q8H   ??? arformoteroL (BROVANA) neb solution 15 mcg  15 mcg Nebulization BID RT   ??? budesonide (PULMICORT) 500 mcg/2 ml nebulizer suspension  500 mcg Nebulization BID RT   ??? albuterol (PROVENTIL VENTOLIN) nebulizer solution 2.5 mg  2.5 mg Nebulization Q2H PRN   ??? dextrose (D50W) injection syrg 5-25 g  10-50 mL IntraVENous PRN   ??? glucagon (GLUCAGEN) injection 1 mg  1 mg IntraMUSCular PRN   ??? insulin glargine (LANTUS) injection 1-100 Units  1-100 Units SubCUTAneous QHS   ??? insulin lispro (HUMALOG) injection 1-100 Units  1-100 Units SubCUTAneous AC&HS   ??? insulin lispro (HUMALOG) injection 1-100 Units  1-100 Units SubCUTAneous PRN        Care Plan discussed with: pt     Total time spent with patient: 25 minutes.    Dorothe Pea, MD  August 25, 2018  1305

## 2018-08-25 NOTE — Progress Notes (Signed)
PAGER ID: [phone]   MESSAGE: [phone] [place] is still anxious. Pls order PRN thank you [phone] cindy    -MD ordered Atarax 50mg  once

## 2018-08-25 NOTE — Progress Notes (Signed)
PAGER ID: [phone]   MESSAGE: [phone] Justiss - DX: Resp Failure. Pt very anxious. Pls order anti anxiety medicine doc pls -cindy [phone]

## 2018-08-25 NOTE — Progress Notes (Signed)
Pt insists to use purewick and diaper

## 2018-08-26 LAB — POCT GLUCOSE
POC Glucose: 150 mg/dL — ABNORMAL HIGH (ref 65–105)
POC Glucose: 142 mg/dL — ABNORMAL HIGH (ref 65–105)
POC Glucose: 169 mg/dL — ABNORMAL HIGH (ref 65–105)
POC Glucose: 299 mg/dL — ABNORMAL HIGH (ref 65–105)

## 2018-08-26 LAB — GLUCOSE, POC
Glucose (POC): 150 mg/dL — ABNORMAL HIGH (ref 65–105)
Glucose (POC): 142 mg/dL — ABNORMAL HIGH (ref 65–105)
Glucose (POC): 169 mg/dL — ABNORMAL HIGH (ref 65–105)
Glucose (POC): 299 mg/dL — ABNORMAL HIGH (ref 65–105)

## 2018-08-26 MED ORDER — ACETYLCYSTEINE 20 % (200 MG/ML) SOLN
200 mg/mL (20 %) | Freq: Three times a day (TID) | Status: DC
Start: 2018-08-26 — End: 2018-08-26
  Administered 2018-08-26: 13:00:00 via RESPIRATORY_TRACT

## 2018-08-26 MED ORDER — HYDROXYZINE 25 MG TAB
25 mg | Freq: Three times a day (TID) | ORAL | Status: DC | PRN
Start: 2018-08-26 — End: 2018-09-07
  Administered 2018-08-26 – 2018-08-28 (×2): via ORAL

## 2018-08-26 MED ORDER — METHYLPREDNISOLONE (PF) 40 MG/ML IJ SOLR
40 mg/mL | Freq: Three times a day (TID) | INTRAMUSCULAR | Status: DC
Start: 2018-08-26 — End: 2018-08-28
  Administered 2018-08-26 – 2018-08-28 (×6): via INTRAVENOUS

## 2018-08-26 MED FILL — QUETIAPINE SR 50 MG 24 HR TAB: 50 mg | ORAL | Qty: 3

## 2018-08-26 MED FILL — SOLU-MEDROL (PF) 40 MG/ML SOLUTION FOR INJECTION: 40 mg/mL | INTRAMUSCULAR | Qty: 2

## 2018-08-26 MED FILL — CLONIDINE 0.1 MG TAB: 0.1 mg | ORAL | Qty: 3

## 2018-08-26 MED FILL — LOSARTAN 25 MG TAB: 25 mg | ORAL | Qty: 1

## 2018-08-26 MED FILL — BROVANA 15 MCG/2 ML SOLUTION FOR NEBULIZATION: 15 mcg/2 mL | RESPIRATORY_TRACT | Qty: 1

## 2018-08-26 MED FILL — GABAPENTIN 300 MG CAP: 300 mg | ORAL | Qty: 1

## 2018-08-26 MED FILL — ALBUTEROL SULFATE 0.083 % (0.83 MG/ML) SOLN FOR INHALATION: 2.5 mg /3 mL (0.083 %) | RESPIRATORY_TRACT | Qty: 1

## 2018-08-26 MED FILL — HEPARIN (PORCINE) 5,000 UNIT/ML IJ SOLN: 5000 unit/mL | INTRAMUSCULAR | Qty: 1

## 2018-08-26 MED FILL — KETOROLAC TROMETHAMINE 15 MG/ML INJECTION: 15 mg/mL | INTRAMUSCULAR | Qty: 1

## 2018-08-26 MED FILL — MONTELUKAST 10 MG TAB: 10 mg | ORAL | Qty: 1

## 2018-08-26 MED FILL — ACETYLCYSTEINE 20 % (200 MG/ML) SOLN: 200 mg/mL (20 %) | Qty: 4

## 2018-08-26 MED FILL — HYDROCODONE-ACETAMINOPHEN 5 MG-325 MG TAB: 5-325 mg | ORAL | Qty: 1

## 2018-08-26 MED FILL — BUDESONIDE 0.5 MG/2 ML NEB SUSPENSION: 0.5 mg/2 mL | RESPIRATORY_TRACT | Qty: 1

## 2018-08-26 MED FILL — YUPELRI 175 MCG/3 ML SOLUTION FOR NEBULIZATION: 175 mcg/3 mL | RESPIRATORY_TRACT | Qty: 1

## 2018-08-26 MED FILL — HYDROXYZINE 25 MG TAB: 25 mg | ORAL | Qty: 1

## 2018-08-26 MED FILL — DILTIAZEM ER 180 MG 24 HR CAP: 180 mg | ORAL | Qty: 1

## 2018-08-26 MED FILL — DEXTROMETHORPHAN POLY COMPLEX SR 30 MG/5 ML ORAL 12 HR SUSP: 30 mg/5 mL | ORAL | Qty: 5

## 2018-08-26 MED FILL — AZITHROMYCIN 250 MG TAB: 250 mg | ORAL | Qty: 2

## 2018-08-26 MED FILL — PREDNISONE 20 MG TAB: 20 mg | ORAL | Qty: 2

## 2018-08-26 NOTE — Progress Notes (Signed)
Hospitalist Progress Note         [facility]    Daily Progress Note: 08/26/2018    Assessment/Plan:   1.  Acute hypoxic respiratory failure, POA.  2.  Acute exacerbation COPD. Called earlier by RT who noted Pt had been changed to Prednisone 40mg  every day, resumed Solumedrol 60mg  q 8h  3.  Acute bronchitis  4.  HTN  5.  T2DM  6.  Left upper lobe lung nodule, will need continued follow-up   7.  Dyslipidemia  8.  PTSD, she was [place], has had respiratory problems since.  RN notes anxiety @ times. Try prn Hydroxyzine  9.  Peripheral neuropathy  10.  GERD.    11.  DJD  12.  DVT proph w sc Heparin  13.  Dispo: Still probably needs a couple of days  Subjective:   Feels worse. Still coughing w white sputum. Still SOB & wheezing. No C/P.      Cardiovascular ROS: no chest pain ,  orthopnea  Gastrointestinal ROS: no abdominal pain, nausea,vomiting, diarrhea, melena or hematechezia  Genito-Urinary ROS: no dysuria, trouble voiding, or hematuria  Objective:   Physical Exam:     Visit Vitals  BP (!) 150/97 (BP 1 Location: Right arm, BP Patient Position: Supine)   Pulse 86   Temp 97.7 ??F (36.5 ??C)   Resp 20   Ht 5\' 8"  (1.727 m)   Wt 91.6 kg (202 lb)   SpO2 96%   BMI 30.71 kg/m??    O2 Flow Rate (L/min): 2 l/min O2 Device: Nasal cannula    Temp (24hrs), Avg:98 ??F (36.7 ??C), Min:97.5 ??F (36.4 ??C), Max:98.5 ??F (36.9 ??C)    03/15 0701 - 03/15 1900  In: 260 [P.O.:260]  Out: -    03/13 1901 - 03/15 0700  In: 1480 [P.O.:1480]  Out: 1700 [Urine:1700]    General:  Alert, cooperative, no distress, appears stated age.   Lungs:    Still significant exp wheezes    :     Heart:  Regular rate and rhythm, S1, S2 normal, no murmur, click, rub or gallop.   Abdomen:   Soft, non-tender. Bowel sounds normal. No masses,  No organomegaly.   Extremities:  FROM, no LE edema   Pulses: 2+ and symmetric all extremities.   Skin: Skin color, texture, turgor normal. No rashes or lesions   :      Data Review:        24 Hour Results:  Recent Results (from the past 24 hour(s))   GLUCOSE, POC    Collection Time: 08/25/18 11:42 AM   Result Value Ref Range    Glucose (POC) 118 (H) 65 - 105 mg/dL   GLUCOSE, POC    Collection Time: 08/25/18  4:54 PM   Result Value Ref Range    Glucose (POC) 131 (H) 65 - 105 mg/dL   GLUCOSE, POC    Collection Time: 08/25/18  9:16 PM   Result Value Ref Range    Glucose (POC) 150 (H) 65 - 105 mg/dL   GLUCOSE, POC    Collection Time: 08/26/18  7:48 AM   Result Value Ref Range    Glucose (POC) 142 (H) 65 - 105 mg/dL       Problem List:  Problem List as of 08/26/2018 Date Reviewed: 09/05/18          Codes Class Noted - Resolved    Respiratory failure with hypoxia (HCC) ICD-10-CM: J96.91  ICD-9-CM: 518.81  08/22/2018 - Present        Headache ICD-10-CM: R51  ICD-9-CM: 784.0  08/05/2018 - Present        Abdominal pain ICD-10-CM: R10.9  ICD-9-CM: 789.00  02/04/2018 - Present        SIRS (systemic inflammatory response syndrome) (HCC) ICD-10-CM: R65.10  ICD-9-CM: 995.90  10/09/2017 - Present        Hyperkalemia ICD-10-CM: E87.5  ICD-9-CM: 276.7  09/08/2017 - Present        Obtunded ICD-10-CM: R40.1  ICD-9-CM: 780.09  09/08/2017 - Present        Acute renal failure (ARF) (HCC) ICD-10-CM: N17.9  ICD-9-CM: 584.9  09/08/2017 - Present        Septic shock (HCC) ICD-10-CM: A41.9, R65.21  ICD-9-CM: 038.9, 785.52, 995.92  09/08/2017 - Present        Metabolic encephalopathy ICD-10-CM: G93.41  ICD-9-CM: 348.31  09/08/2017 - Present        Dehydration ICD-10-CM: E86.0  ICD-9-CM: 276.51  08/07/2017 - Present        COPD with acute exacerbation (HCC) ICD-10-CM: J44.1  ICD-9-CM: 491.21  08/07/2017 - Present        Acute-on-chronic kidney injury (HCC) ICD-10-CM: N17.9, N18.9  ICD-9-CM: 584.9, 585.9  08/07/2017 - Present        Chest pain ICD-10-CM: R07.9  ICD-9-CM: 786.50  06/15/2017 - Present        Dyspnea ICD-10-CM: R06.00  ICD-9-CM: 786.09  06/15/2017 - Present         Type 2 diabetes mellitus with diabetic neuropathy (HCC) ICD-10-CM: E11.40  ICD-9-CM: 250.60, 357.2  12/07/2016 - Present        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9  ICD-9-CM: 038.9, 995.91  07/24/2016 - Present        Asthma exacerbation ICD-10-CM: J45.901  ICD-9-CM: 493.92  07/24/2016 - Present        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: E11.21  ICD-9-CM: 250.40, 583.81  06/12/2016 - Present        Sacroiliitis (HCC) ICD-10-CM: M46.1  ICD-9-CM: 720.2  11/25/2015 - Present        Essential hypertension ICD-10-CM: I10  ICD-9-CM: 401.9  10/06/2015 - Present        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  ICD-9-CM: 721.3  09/15/2015 - Present        Lumbar and sacral osteoarthritis ICD-10-CM: M47.817  ICD-9-CM: 721.3  09/15/2015 - Present        Chronic pain syndrome ICD-10-CM: G89.4  ICD-9-CM: 338.4  09/15/2015 - Present        Type 2 diabetes mellitus with hyperglycemia, without long-term current use of insulin (HCC) ICD-10-CM: E11.65  ICD-9-CM: 250.00, 790.29  08/20/2015 - Present  Acute colitis ICD-10-CM: K52.9  ICD-9-CM: 558.9  07/02/2015 - Present        Acute hyperglycemia ICD-10-CM: R73.9  ICD-9-CM: 790.29  07/02/2015 - Present        Accelerated hypertension ICD-10-CM: I10  ICD-9-CM: 401.0  04/08/2015 - Present        Severe headache ICD-10-CM: R51   ICD-9-CM: 784.0  04/07/2015 - Present        Osteoarthritis of hips, 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  ICD-9-CM: 715.95  01/29/2015 - Present        PTSD (post-traumatic stress disorder) (Chronic) ICD-10-CM: F43.10  ICD-9-CM: 309.81  01/29/2015 - Present        Severe hypertension (Chronic) ICD-10-CM: I10  ICD-9-CM: 401.9  07/21/2014 - Present        RESOLVED: COPD exacerbation (HCC) ICD-10-CM: J44.1  ICD-9-CM: 491.21  11/14/2017 - 08/08/2018        RESOLVED: New onset type 1 diabetes mellitus, uncontrolled (HCC) ICD-10-CM: E10.65  ICD-9-CM: 250.03  07/02/2015 - 10/06/2015        RESOLVED: Recurrent major depressive disorder, in full remission (HCC) ICD-10-CM: F33.42  ICD-9-CM: 296.36  12/02/2014 - 12/02/2014        RESOLVED: Foreign body in colon ICD-10-CM: T18.4XXA  ICD-9-CM: 936  08/14/2014 - 01/29/2015        RESOLVED: Advanced care planning/counseling discussion ICD-10-CM: Z71.89  ICD-9-CM: V65.49  07/21/2014 - 01/29/2015    Overview Signed 07/21/2014  3:46 PM by Littie Deeds     Patient given [place] application               RESOLVED: Hip pain, chronic ICD-10-CM: M25.559, G89.29  ICD-9-CM: 719.45, 338.29  07/21/2014 - 01/29/2015               Medications reviewed  Current Facility-Administered Medications   Medication Dose Route Frequency   ??? methylPREDNISolone (PF) (SOLU-MEDROL) injection 60 mg  60 mg IntraVENous Q8H   ??? albuterol (PROVENTIL VENTOLIN) nebulizer solution 2.5 mg  2.5 mg Nebulization Q4H RT   ??? cloNIDine HCL (CATAPRES) tablet 0.3 mg  0.3 mg Oral BID   ??? azithromycin (ZITHROMAX) tablet 500 mg  500 mg Oral DAILY   ??? ketorolac (TORADOL) injection 15 mg  15 mg IntraVENous Q6H PRN   ??? QUEtiapine SR (SEROquel XR) tablet 150 mg  150 mg Oral QHS   ??? revefenacin (YUPELRI) nebulizer solution 175 mcg  175 mcg Nebulization DAILY   ??? dextromethorphan (DELSYM) 30 mg/5 mL syrup 30 mg  30 mg Oral Q12H PRN   ??? dilTIAZem CD (CARDIZEM CD) capsule 180 mg  180 mg Oral DAILY    ??? gabapentin (NEURONTIN) capsule 300 mg  300 mg Oral TID   ??? losartan (COZAAR) tablet 12.5 mg  12.5 mg Oral DAILY   ??? montelukast (SINGULAIR) tablet 10 mg  10 mg Oral QHS   ??? naloxone (NARCAN) injection 0.1 mg  0.1 mg IntraVENous PRN   ??? acetaminophen (TYLENOL) tablet 650 mg  650 mg Oral Q4H PRN    Or   ??? acetaminophen (TYLENOL) solution 650 mg  650 mg Oral Q4H PRN    Or   ??? acetaminophen (TYLENOL) suppository 650 mg  650 mg Rectal Q4H PRN   ??? HYDROcodone-acetaminophen (NORCO) 5-325 mg per tablet 1 Tab  1 Tab Oral Q4H PRN   ??? ondansetron (ZOFRAN) injection 4 mg  4 mg IntraVENous Q4H PRN   ??? heparin (porcine) injection 5,000 Units  5,000 Units SubCUTAneous Q8H   ??? arformoteroL (BROVANA)  neb solution 15 mcg  15 mcg Nebulization BID RT   ??? budesonide (PULMICORT) 500 mcg/2 ml nebulizer suspension  500 mcg Nebulization BID RT   ??? albuterol (PROVENTIL VENTOLIN) nebulizer solution 2.5 mg  2.5 mg Nebulization Q2H PRN   ??? dextrose (D50W) injection syrg 5-25 g  10-50 mL IntraVENous PRN   ??? glucagon (GLUCAGEN) injection 1 mg  1 mg IntraMUSCular PRN   ??? insulin glargine (LANTUS) injection 1-100 Units  1-100 Units SubCUTAneous QHS   ??? insulin lispro (HUMALOG) injection 1-100 Units  1-100 Units SubCUTAneous AC&HS   ??? insulin lispro (HUMALOG) injection 1-100 Units  1-100 Units SubCUTAneous PRN        Care Plan discussed with: pt     Total time spent with patient: 25 minutes.    Dorothe Pea, MD  August 26, 2018  1045

## 2018-08-26 NOTE — Other (Signed)
Bedside shift change report given to Courtney (oncoming nurse) by Cyndy (offgoing nurse). Report included the following information SBAR, Kardex and MAR.

## 2018-08-26 NOTE — Other (Signed)
Bedside shift change report given to Cyndy Clare F Domingo, RN   (oncoming nurse) by Emily RN (offgoing nurse). Report included the following information SBAR.

## 2018-08-26 NOTE — Progress Notes (Signed)
Problem: Gas Exchange - Impaired  Goal: *Absence of hypoxia  Outcome: Progressing Towards Goal     Problem: Patient Education: Go to Patient Education Activity  Goal: Patient/Family Education  Outcome: Progressing Towards Goal     Problem: Falls - Risk of  Goal: *Absence of Falls  Description: Document Schmid Fall Risk and appropriate interventions in the flowsheet.  Outcome: Progressing Towards Goal  Note: Fall Risk Interventions:  Mobility Interventions: Assess mobility with egress test, Bed/chair exit alarm, Patient to call before getting OOB         Medication Interventions: Assess postural VS orthostatic hypotension, Bed/chair exit alarm, Patient to call before getting OOB, Teach patient to arise slowly    Elimination Interventions: Bed/chair exit alarm, Call light in reach, Patient to call for help with toileting needs              Problem: Patient Education: Go to Patient Education Activity  Goal: Patient/Family Education  Outcome: Progressing Towards Goal     Problem: Pressure Injury - Risk of  Goal: *Prevention of pressure injury  Description: Document Braden Scale and appropriate interventions in the flowsheet.  Outcome: Progressing Towards Goal  Note: Pressure Injury Interventions:       Moisture Interventions: Absorbent underpads, Apply protective barrier, creams and emollients, Maintain skin hydration (lotion/cream), Minimize layers    Activity Interventions: Increase time out of bed, Pressure redistribution bed/mattress(bed type)    Mobility Interventions: HOB 30 degrees or less, Pressure redistribution bed/mattress (bed type)    Nutrition Interventions: Document food/fluid/supplement intake                     Problem: Patient Education: Go to Patient Education Activity  Goal: Patient/Family Education  Outcome: Progressing Towards Goal

## 2018-08-26 NOTE — Progress Notes (Signed)
Hospitalist Progress Note         [facility]    Daily Progress Note: 08/26/2018    Assessment/Plan:   1.  Acute hypoxic respiratory failure, POA.  2.  Acute exacerbation COPD. Called earlier by RT who noted Pt had been changed to Prednisone  every day, resumed Solumedrol  q 8h  3.  Acute bronchitis  4.  HTN  5.  T2DM  6.  Left upper lobe lung nodule, will need continued follow-up   7.  Dyslipidemia  8.  PTSD, [place], has had respiratory problems since.  RN notes anxiety @ times. Try prn Hydroxyzine  9.  Peripheral neuropathy  10.  GERD.    11.  DJD  12.  DVT proph w sc Heparin  13.  Dispo: Still probably needs a couple of days  Subjective:   Feels worse. Still coughing w white sputum. Still SOB & wheezing. No C/P.      Cardiovascular ROS: no chest pain ,  orthopnea  Gastrointestinal ROS: no abdominal pain, nausea,vomiting, diarrhea, melena or hematechezia  Genito-Urinary ROS: no dysuria, trouble voiding, or hematuria  Objective:   Physical Exam:     Visit Vitals  BP (!) 150/97 (BP 1 Location: Right arm, BP Patient Position: Supine)   Pulse 86   Temp 97.7 ??F (36.5 ??C)   Resp 20   Ht  (1.727 m)   Wt 91.6 kg (202 lb)   SpO2 96%   BMI 30.71 kg/m??    O2 Flow Rate (L/min): 2 l/min O2 Device: Nasal cannula    Temp (24hrs), Avg:98 ??F (36.7 ??C), Min:97.5 ??F (36.4 ??C), Max:98.5 ??F (36.9 ??C)    03/15 0701 - 03/15 1900  In: 260 [P.O.:260]  Out: -    03/13 1901 - 03/15 0700  In: 1480 [P.O.:1480]  Out: 1700 [Urine:1700]    General:  Alert, cooperative, no distress, appears stated age.   Lungs:    Still significant exp wheezes    :     Heart:  Regular rate and rhythm, S1, S2 normal, no murmur, click, rub or gallop.   Abdomen:   Soft, non-tender. Bowel sounds normal. No masses,  No organomegaly.   Extremities:  FROM, no LE edema   Pulses: 2+ and symmetric all extremities.   Skin: Skin color, texture, turgor normal. No rashes or lesions   :      Data Review:       24 Hour  Results:  Recent Results (from the past 24 hour(s))   GLUCOSE, POC    Collection Time: 08/25/18 11:42 AM   Result Value Ref Range    Glucose (POC) 118 (H) 65 - 105 mg/dL   GLUCOSE, POC    Collection Time: 08/25/18  4:54 PM   Result Value Ref Range    Glucose (POC) 131 (H) 65 - 105 mg/dL   GLUCOSE, POC    Collection Time: 08/25/18  9:16 PM   Result Value Ref Range    Glucose (POC) 150 (H) 65 - 105 mg/dL   GLUCOSE, POC    Collection Time: 08/26/18  7:48 AM   Result Value Ref Range    Glucose (POC) 142 (H) 65 - 105 mg/dL       Problem List:  Problem List as of 08/26/2018 Date Reviewed: Aug 23, 2018          Codes Class Noted - Resolved    Respiratory failure with hypoxia (HCC) ICD-10-CM: J96.91  ICD-9-CM: 518.81  08/22/2018 - Present        Headache ICD-10-CM: R51  ICD-9-CM: 784.0  08/05/2018 - Present        Abdominal pain ICD-10-CM: R10.9  ICD-9-CM: 789.00  02/04/2018 - Present        SIRS (systemic inflammatory response syndrome) (HCC) ICD-10-CM: R65.10  ICD-9-CM: 995.90  10/09/2017 - Present        Hyperkalemia ICD-10-CM: E87.5  ICD-9-CM: 276.7  09/08/2017 - Present        Obtunded ICD-10-CM: R40.1  ICD-9-CM: 780.09  09/08/2017 - Present        Acute renal failure (ARF) (HCC) ICD-10-CM: N17.9  ICD-9-CM: 584.9  09/08/2017 - Present        Septic shock (HCC) ICD-10-CM: A41.9, R65.21  ICD-9-CM: 038.9, 785.52, 995.92  09/08/2017 - Present        Metabolic encephalopathy ICD-10-CM: G93.41  ICD-9-CM: 348.31  09/08/2017 - Present        Dehydration ICD-10-CM: E86.0  ICD-9-CM: 276.51  08/07/2017 - Present        COPD with acute exacerbation (HCC) ICD-10-CM: J44.1  ICD-9-CM: 491.21  08/07/2017 - Present        Acute-on-chronic kidney injury (HCC) ICD-10-CM: N17.9, N18.9  ICD-9-CM: 584.9, 585.9  08/07/2017 - Present        Chest pain ICD-10-CM: R07.9  ICD-9-CM: 786.50  06/15/2017 - Present        Dyspnea ICD-10-CM: R06.00  ICD-9-CM: 786.09  06/15/2017 - Present        Type 2 diabetes mellitus with diabetic neuropathy (HCC) ICD-10-CM:  E11.40  ICD-9-CM: 250.60, 357.2  12/07/2016 - Present        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9  ICD-9-CM: 038.9, 995.91  07/24/2016 - Present        Asthma exacerbation ICD-10-CM: J45.901  ICD-9-CM: 493.92  07/24/2016 - Present        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: E11.21  ICD-9-CM: 250.40, 583.81  06/12/2016 - Present        Sacroiliitis (HCC) ICD-10-CM: M46.1  ICD-9-CM: 720.2  11/25/2015 - Present        Essential hypertension ICD-10-CM: I10  ICD-9-CM: 401.9  10/06/2015 - Present        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  ICD-9-CM: 721.3  09/15/2015 - Present        Lumbar and sacral osteoarthritis ICD-10-CM: M47.817  ICD-9-CM: 721.3  09/15/2015 - Present        Chronic pain syndrome ICD-10-CM: G89.4  ICD-9-CM: 338.4  09/15/2015 - Present        Type 2 diabetes mellitus with hyperglycemia, without long-term current use of insulin (HCC) ICD-10-CM: E11.65  ICD-9-CM: 250.00, 790.29  08/20/2015 - Present  Acute colitis ICD-10-CM: K52.9  ICD-9-CM: 558.9  07/02/2015 - Present        Acute hyperglycemia ICD-10-CM: R73.9  ICD-9-CM: 790.29  07/02/2015 - Present        Accelerated hypertension ICD-10-CM: I10  ICD-9-CM: 401.0  04/08/2015 - Present        Severe headache ICD-10-CM: R51  ICD-9-CM: 784.0  04/07/2015 - Present        Osteoarthritis of hips, bilateral  ICD-10-CM: M16.0  ICD-9-CM: 715.95  01/29/2015 - Present        PTSD (post-traumatic stress disorder) (Chronic) ICD-10-CM: F43.10  ICD-9-CM: 309.81  01/29/2015 - Present        Severe hypertension (Chronic) ICD-10-CM: I10  ICD-9-CM: 401.9  07/21/2014 - Present        RESOLVED: COPD exacerbation (HCC) ICD-10-CM: J44.1  ICD-9-CM: 491.21  11/14/2017 - 08/08/2018        RESOLVED: New onset type 1 diabetes mellitus, uncontrolled (HCC) ICD-10-CM: E10.65  ICD-9-CM: 250.03  07/02/2015 - 10/06/2015        RESOLVED: Recurrent major depressive disorder, in full remission (HCC) ICD-10-CM: F33.42  ICD-9-CM: 296.36  12/02/2014 - 12/02/2014        RESOLVED: Foreign body in colon ICD-10-CM: T18.4XXA  ICD-9-CM: 936  08/14/2014 - 01/29/2015        RESOLVED: Advanced care planning/counseling discussion ICD-10-CM: Z71.89  ICD-9-CM: V65.49  07/21/2014 - 01/29/2015    Overview Signed 07/21/2014  3:46 PM by Littie Deeds     Patient given [place] application               RESOLVED: Hip pain, chronic ICD-10-CM: M25.559, G89.29  ICD-9-CM: 719.45, 338.29  07/21/2014 - 01/29/2015               Medications reviewed  Current Facility-Administered Medications   Medication Dose Route Frequency   ??? methylPREDNISolone (PF) (SOLU-MEDROL) injection 60 mg  60 mg IntraVENous Q8H   ??? albuterol (PROVENTIL VENTOLIN) nebulizer solution 2.5 mg  2.5 mg Nebulization Q4H RT   ??? cloNIDine HCL (CATAPRES) tablet 0.3 mg  0.3 mg Oral BID   ??? azithromycin (ZITHROMAX) tablet 500 mg  500 mg Oral DAILY   ??? ketorolac (TORADOL) injection 15 mg  15 mg IntraVENous Q6H PRN   ??? QUEtiapine SR (SEROquel XR) tablet 150 mg  150 mg Oral QHS   ??? revefenacin (YUPELRI) nebulizer solution 175 mcg  175 mcg Nebulization DAILY   ??? dextromethorphan (DELSYM) 30 mg/5 mL syrup 30 mg  30 mg Oral Q12H PRN   ??? dilTIAZem CD (CARDIZEM CD) capsule 180 mg  180 mg Oral DAILY   ??? gabapentin (NEURONTIN) capsule 300 mg  300 mg Oral TID   ??? losartan (COZAAR) tablet 12.5 mg  12.5 mg Oral DAILY   ??? montelukast  (SINGULAIR) tablet 10 mg  10 mg Oral QHS   ??? naloxone (NARCAN) injection 0.1 mg  0.1 mg IntraVENous PRN   ??? acetaminophen (TYLENOL) tablet 650 mg  650 mg Oral Q4H PRN    Or   ??? acetaminophen (TYLENOL) solution 650 mg  650 mg Oral Q4H PRN    Or   ??? acetaminophen (TYLENOL) suppository 650 mg  650 mg Rectal Q4H PRN   ??? HYDROcodone-acetaminophen (NORCO) 5-325 mg per tablet 1 Tab  1 Tab Oral Q4H PRN   ??? ondansetron (ZOFRAN) injection 4 mg  4 mg IntraVENous Q4H PRN   ??? heparin (porcine) injection 5,000 Units  5,000 Units SubCUTAneous Q8H   ??? arformoteroL (BROVANA)  neb solution 15 mcg  15 mcg Nebulization BID RT   ??? budesonide (PULMICORT) 500 mcg/2 ml nebulizer suspension  500 mcg Nebulization BID RT   ??? albuterol (PROVENTIL VENTOLIN) nebulizer solution 2.5 mg  2.5 mg Nebulization Q2H PRN   ??? dextrose (D50W) injection syrg 5-25 g  10-50 mL IntraVENous PRN   ??? glucagon (GLUCAGEN) injection 1 mg  1 mg IntraMUSCular PRN   ??? insulin glargine (LANTUS) injection 1-100 Units  1-100 Units SubCUTAneous QHS   ??? insulin lispro (HUMALOG) injection 1-100 Units  1-100 Units SubCUTAneous AC&HS   ??? insulin lispro (HUMALOG) injection 1-100 Units  1-100 Units SubCUTAneous PRN        Care Plan discussed with: pt     Total time spent with patient: 25 minutes.    Dorothe Pea, MD  August 26, 2018  1045

## 2018-08-26 NOTE — Progress Notes (Signed)
Problem: Gas Exchange - Impaired  Goal: *Absence of hypoxia  Outcome: Progressing Towards Goal     Problem: Patient Education: Go to Patient Education Activity  Goal: Patient/Family Education  Outcome: Progressing Towards Goal     Problem: Falls - Risk of  Goal: *Absence of Falls  Description: Document [name] Fall Risk and appropriate interventions in the flowsheet.  Outcome: Progressing Towards Goal  Note: Fall Risk Interventions:  Mobility Interventions: Assess mobility with egress test, Bed/chair exit alarm, Patient to call before getting OOB         Medication Interventions: Assess postural VS orthostatic hypotension, Bed/chair exit alarm, Patient to call before getting OOB, Teach patient to arise slowly    Elimination Interventions: Bed/chair exit alarm, Call light in reach, Patient to call for help with toileting needs              Problem: Patient Education: Go to Patient Education Activity  Goal: Patient/Family Education  Outcome: Progressing Towards Goal     Problem: Pressure Injury - Risk of  Goal: *Prevention of pressure injury  Description: Document Braden Scale and appropriate interventions in the flowsheet.  Outcome: Progressing Towards Goal  Note: Pressure Injury Interventions:       Moisture Interventions: Absorbent underpads, Apply protective barrier, creams and emollients, Maintain skin hydration (lotion/cream), Minimize layers    Activity Interventions: Increase time out of bed, Pressure redistribution bed/mattress(bed type)    Mobility Interventions: HOB 30 degrees or less, Pressure redistribution bed/mattress (bed type)    Nutrition Interventions: Document food/fluid/supplement intake                     Problem: Patient Education: Go to Patient Education Activity  Goal: Patient/Family Education  Outcome: Progressing Towards Goal

## 2018-08-27 LAB — POCT GLUCOSE
POC Glucose: 200 mg/dL — ABNORMAL HIGH (ref 65–105)
POC Glucose: 215 mg/dL — ABNORMAL HIGH (ref 65–105)
POC Glucose: 238 mg/dL — ABNORMAL HIGH (ref 65–105)
POC Glucose: 190 mg/dL — ABNORMAL HIGH (ref 65–105)

## 2018-08-27 LAB — CULTURE, RESPIRATORY/SPUTUM/BRONCH W GRAM STAIN

## 2018-08-27 LAB — GLUCOSE, POC
Glucose (POC): 200 mg/dL — ABNORMAL HIGH (ref 65–105)
Glucose (POC): 215 mg/dL — ABNORMAL HIGH (ref 65–105)
Glucose (POC): 238 mg/dL — ABNORMAL HIGH (ref 65–105)
Glucose (POC): 190 mg/dL — ABNORMAL HIGH (ref 65–105)

## 2018-08-27 MED FILL — HYDROCODONE-ACETAMINOPHEN 5 MG-325 MG TAB: 5-325 mg | ORAL | Qty: 1

## 2018-08-27 MED FILL — LOSARTAN 25 MG TAB: 25 mg | ORAL | Qty: 1

## 2018-08-27 MED FILL — ALBUTEROL SULFATE 0.083 % (0.83 MG/ML) SOLN FOR INHALATION: 2.5 mg /3 mL (0.083 %) | RESPIRATORY_TRACT | Qty: 1

## 2018-08-27 MED FILL — HEPARIN (PORCINE) 5,000 UNIT/ML IJ SOLN: 5000 unit/mL | INTRAMUSCULAR | Qty: 1

## 2018-08-27 MED FILL — SOLU-MEDROL (PF) 40 MG/ML SOLUTION FOR INJECTION: 40 mg/mL | INTRAMUSCULAR | Qty: 2

## 2018-08-27 MED FILL — GABAPENTIN 300 MG CAP: 300 mg | ORAL | Qty: 1

## 2018-08-27 MED FILL — YUPELRI 175 MCG/3 ML SOLUTION FOR NEBULIZATION: 175 mcg/3 mL | RESPIRATORY_TRACT | Qty: 1

## 2018-08-27 MED FILL — BUDESONIDE 0.5 MG/2 ML NEB SUSPENSION: 0.5 mg/2 mL | RESPIRATORY_TRACT | Qty: 1

## 2018-08-27 MED FILL — DILTIAZEM ER 180 MG 24 HR CAP: 180 mg | ORAL | Qty: 1

## 2018-08-27 MED FILL — BROVANA 15 MCG/2 ML SOLUTION FOR NEBULIZATION: 15 mcg/2 mL | RESPIRATORY_TRACT | Qty: 1

## 2018-08-27 MED FILL — CLONIDINE 0.1 MG TAB: 0.1 mg | ORAL | Qty: 3

## 2018-08-27 MED FILL — QUETIAPINE SR 50 MG 24 HR TAB: 50 mg | ORAL | Qty: 3

## 2018-08-27 MED FILL — MONTELUKAST 10 MG TAB: 10 mg | ORAL | Qty: 1

## 2018-08-27 NOTE — Other (Signed)
Bedside shift change report given to Paz RN   (oncoming nurse) by Sonia Rogers RN (offgoing nurse). Report included the following information SBAR.

## 2018-08-27 NOTE — Progress Notes (Signed)
Hospitalist Progress Note         [facility]    Daily Progress Note: 08/27/2018    Assessment/Plan:   1.  Acute hypoxic respiratory failure, POA.  2.  Acute exacerbation COPD.   3.  Acute bronchitis  4.  HTN  5.  T2DM  6.  Left upper lobe lung nodule, will need continued follow-up   7.  Dyslipidemia  8.  PTSD, she was [place], has had respiratory problems since.  RN notes anxiety @ times. Try prn Hydroxyzine  9.  Peripheral neuropathy  10.  GERD.    11.  DJD  12.  DVT proph w sc Heparin  13.  Dispo: Still probably needs a couple of days  Subjective:   Still coughing & wheezing. Still intermittent HA.       Cardiovascular ROS: no chest pain ,  orthopnea  Gastrointestinal ROS: no abdominal pain, nausea,vomiting, diarrhea, melena or hematechezia  Genito-Urinary ROS: no dysuria, trouble voiding, or hematuria  Objective:   Physical Exam:     Visit Vitals  BP (!) 153/102   Pulse 78   Temp 97.9 ??F (36.6 ??C)   Resp 18   Ht  (1.727 m)   Wt 91.6 kg (202 lb)   SpO2 96%   BMI 30.71 kg/m??    O2 Flow Rate (L/min): 2 l/min O2 Device: Nasal cannula    Temp (24hrs), Avg:97.9 ??F (36.6 ??C), Min:97.5 ??F (36.4 ??C), Max:98.4 ??F (36.9 ??C)    No intake/output data recorded.   03/14 1901 - 03/16 0700  In: 1020 [P.O.:1020]  Out: 4600 [Urine:4600]    General:  Alert, cooperative, no distress, appears stated age.   Lungs:   Slightly less Exp wheezes    :     Heart:  Regular rate and rhythm, S1, S2 normal, no murmur, click, rub or gallop.   Abdomen:   Soft, non-tender. Bowel sounds normal. No masses,  No organomegaly.   Extremities:  FROM, no LE edema   Pulses: 2+ and symmetric all extremities.   Skin: Skin color, texture, turgor normal. No rashes or lesions   :      Data Review:       24 Hour Results:  Recent Results (from the past 24 hour(s))   GLUCOSE, POC    Collection Time: 08/26/18 11:42 AM   Result Value Ref Range    Glucose (POC) 169 (H) 65 - 105 mg/dL   GLUCOSE, POC     Collection Time: 08/26/18  5:13 PM   Result Value Ref Range    Glucose (POC) 299 (H) 65 - 105 mg/dL   GLUCOSE, POC    Collection Time: 08/26/18  9:58 PM   Result Value Ref Range    Glucose (POC) 200 (H) 65 - 105 mg/dL   GLUCOSE, POC    Collection Time: 08/27/18  8:51 AM   Result Value Ref Range    Glucose (POC) 215 (H) 65 - 105 mg/dL       Problem List:  Problem List as of 08/27/2018 Date Reviewed: Aug 31, 2018          Codes Class Noted - Resolved    Respiratory failure with hypoxia (HCC) ICD-10-CM: J96.91  ICD-9-CM: 518.81  08/22/2018 - Present        Headache ICD-10-CM: R51  ICD-9-CM: 784.0  08/05/2018 - Present        Abdominal pain ICD-10-CM: R10.9  ICD-9-CM: 789.00  02/04/2018 - Present  SIRS (systemic inflammatory response syndrome) (HCC) ICD-10-CM: R65.10  ICD-9-CM: 995.90  10/09/2017 - Present        Hyperkalemia ICD-10-CM: E87.5  ICD-9-CM: 276.7  09/08/2017 - Present        Obtunded ICD-10-CM: R40.1  ICD-9-CM: 780.09  09/08/2017 - Present        Acute renal failure (ARF) (HCC) ICD-10-CM: N17.9  ICD-9-CM: 584.9  09/08/2017 - Present        Septic shock (HCC) ICD-10-CM: A41.9, R65.21  ICD-9-CM: 038.9, 785.52, 995.92  09/08/2017 - Present        Metabolic encephalopathy ICD-10-CM: G93.41  ICD-9-CM: 348.31  09/08/2017 - Present        Dehydration ICD-10-CM: E86.0  ICD-9-CM: 276.51  08/07/2017 - Present        COPD with acute exacerbation (HCC) ICD-10-CM: J44.1  ICD-9-CM: 491.21  08/07/2017 - Present        Acute-on-chronic kidney injury (HCC) ICD-10-CM: N17.9, N18.9  ICD-9-CM: 584.9, 585.9  08/07/2017 - Present        Chest pain ICD-10-CM: R07.9  ICD-9-CM: 786.50  06/15/2017 - Present        Dyspnea ICD-10-CM: R06.00  ICD-9-CM: 786.09  06/15/2017 - Present        Type 2 diabetes mellitus with diabetic neuropathy (HCC) ICD-10-CM: E11.40  ICD-9-CM: 250.60, 357.2  12/07/2016 - Present        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  ICD-9-CM: 569.89  08/17/2016 - Present         Cannabinoid hyperemesis syndrome (HCC) ICD-10-CM: F12.988  ICD-9-CM: 536.2, 305.20  08/17/2016 - Present        Gastritis ICD-10-CM: K29.70  ICD-9-CM: 535.50  08/16/2016 - Present        Nausea & vomiting ICD-10-CM: R11.2  ICD-9-CM: 787.01  08/16/2016 - Present        Asthma with acute exacerbation ICD-10-CM: J45.901  ICD-9-CM: 493.92  08/04/2016 - Present        Lactic acidosis ICD-10-CM: E87.2  ICD-9-CM: 276.2  07/24/2016 - Present        Leukocytosis ICD-10-CM: D72.829  ICD-9-CM: 288.60  07/24/2016 - Present        Sepsis (HCC) ICD-10-CM: A41.9  ICD-9-CM: 038.9, 995.91  07/24/2016 - Present        Asthma exacerbation ICD-10-CM: J45.901  ICD-9-CM: 493.92  07/24/2016 - Present        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: E11.21  ICD-9-CM: 250.40, 583.81  06/12/2016 - Present        Sacroiliitis (HCC) ICD-10-CM: M46.1  ICD-9-CM: 720.2  11/25/2015 - Present        Essential hypertension ICD-10-CM: I10  ICD-9-CM: 401.9  10/06/2015 - Present        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  ICD-9-CM: 721.3  09/15/2015 - Present        Lumbar and sacral osteoarthritis ICD-10-CM: M47.817  ICD-9-CM: 721.3  09/15/2015 - Present        Chronic pain syndrome ICD-10-CM: G89.4  ICD-9-CM: 338.4  09/15/2015 - Present        Type 2 diabetes mellitus with hyperglycemia, without long-term current use of insulin (HCC) ICD-10-CM: E11.65  ICD-9-CM: 250.00, 790.29  08/20/2015 - Present        Acute colitis ICD-10-CM: K52.9  ICD-9-CM: 558.9  07/02/2015 - Present        Acute hyperglycemia ICD-10-CM: R73.9  ICD-9-CM: 790.29  07/02/2015 - Present        Accelerated hypertension ICD-10-CM: [employer]  ICD-9-CM: 401.0  04/08/2015 - Present        Severe headache ICD-10-CM: R51  ICD-9-CM: 784.0  04/07/2015 - Present        Osteoarthritis of hips, 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  ICD-9-CM: 715.95  01/29/2015 - Present        PTSD (post-traumatic stress disorder) (Chronic) ICD-10-CM: F43.10   ICD-9-CM: 309.81  01/29/2015 - Present        Severe hypertension (Chronic) ICD-10-CM: I10  ICD-9-CM: 401.9  07/21/2014 - Present        RESOLVED: COPD exacerbation (HCC) ICD-10-CM: J44.1  ICD-9-CM: 491.21  11/14/2017 - 08/08/2018        RESOLVED: New onset type 1 diabetes mellitus, uncontrolled (HCC) ICD-10-CM: E10.65  ICD-9-CM: 250.03  07/02/2015 - 10/06/2015        RESOLVED: Recurrent major depressive disorder, in full remission (HCC) ICD-10-CM: F33.42  ICD-9-CM: 296.36  12/02/2014 - 12/02/2014        RESOLVED: Foreign body in colon ICD-10-CM: T18.4XXA  ICD-9-CM: 936  08/14/2014 - 01/29/2015        RESOLVED: Advanced care planning/counseling discussion ICD-10-CM: Z71.89  ICD-9-CM: V65.49  07/21/2014 - 01/29/2015    Overview Signed 07/21/2014  3:46 PM by Littie Deeds     Patient given [place] application               RESOLVED: Hip pain, chronic ICD-10-CM: M25.559, G89.29  ICD-9-CM: 719.45, 338.29  07/21/2014 - 01/29/2015               Medications reviewed  Current Facility-Administered Medications   Medication Dose Route Frequency   ??? methylPREDNISolone (PF) (SOLU-MEDROL) injection 60 mg  60 mg IntraVENous Q8H   ??? hydrOXYzine HCL (ATARAX) tablet 25 mg  25 mg Oral TID PRN   ??? albuterol (PROVENTIL VENTOLIN) nebulizer solution 2.5 mg  2.5 mg Nebulization Q4H RT   ??? cloNIDine HCL (CATAPRES) tablet 0.3 mg  0.3 mg Oral BID   ??? ketorolac (TORADOL) injection 15 mg  15 mg IntraVENous Q6H PRN   ??? QUEtiapine SR (SEROquel XR) tablet 150 mg  150 mg Oral QHS   ??? revefenacin (YUPELRI) nebulizer solution 175 mcg  175 mcg Nebulization DAILY   ??? dextromethorphan (DELSYM) 30 mg/5 mL syrup 30 mg  30 mg Oral Q12H PRN   ??? dilTIAZem CD (CARDIZEM CD) capsule 180 mg  180 mg Oral DAILY   ??? gabapentin (NEURONTIN) capsule 300 mg  300 mg Oral TID   ??? losartan (COZAAR) tablet 12.5 mg  12.5 mg Oral DAILY   ??? montelukast (SINGULAIR) tablet 10 mg  10 mg Oral QHS   ??? naloxone (NARCAN) injection 0.1 mg  0.1 mg IntraVENous PRN    ??? acetaminophen (TYLENOL) tablet 650 mg  650 mg Oral Q4H PRN    Or   ??? acetaminophen (TYLENOL) solution 650 mg  650 mg Oral Q4H PRN    Or   ??? acetaminophen (TYLENOL) suppository 650 mg  650 mg Rectal Q4H PRN   ??? HYDROcodone-acetaminophen (NORCO) 5-325 mg per tablet 1 Tab  1 Tab Oral Q4H PRN   ??? ondansetron (ZOFRAN) injection 4 mg  4 mg IntraVENous Q4H PRN   ??? heparin (porcine) injection 5,000 Units  5,000 Units SubCUTAneous Q8H   ??? arformoteroL (BROVANA) neb solution 15 mcg  15 mcg Nebulization BID RT   ??? budesonide (PULMICORT) 500 mcg/2 ml nebulizer suspension  500 mcg Nebulization BID RT   ??? albuterol (PROVENTIL VENTOLIN) nebulizer solution 2.5 mg  2.5 mg  Nebulization Q2H PRN   ??? dextrose (D50W) injection syrg 5-25 g  10-50 mL IntraVENous PRN   ??? glucagon (GLUCAGEN) injection 1 mg  1 mg IntraMUSCular PRN   ??? insulin glargine (LANTUS) injection 1-100 Units  1-100 Units SubCUTAneous QHS   ??? insulin lispro (HUMALOG) injection 1-100 Units  1-100 Units SubCUTAneous AC&HS   ??? insulin lispro (HUMALOG) injection 1-100 Units  1-100 Units SubCUTAneous PRN        Care Plan discussed with: pt     Total time spent with patient: 25 minutes.    Dorothe Pea, MD  August 27, 2018  1135

## 2018-08-27 NOTE — Other (Signed)
Bedside shift change report given to Sonia Rogers RN (oncoming nurse) by Courtney Almy, RN  (offgoing nurse). Report included the following information SBAR.

## 2018-08-27 NOTE — Progress Notes (Signed)
Hospitalist Progress Note         [facility]    Daily Progress Note: 08/27/2018    Assessment/Plan:   1.  Acute hypoxic respiratory failure, POA.  2.  Acute exacerbation COPD.   3.  Acute bronchitis  4.  HTN  5.  T2DM  6.  Left upper lobe lung nodule, will need continued follow-up   7.  Dyslipidemia  8.  PTSD, [place], has had respiratory problems since.  RN notes anxiety @ times. Try prn Hydroxyzine  9.  Peripheral neuropathy  10.  GERD.    11.  DJD  12.  DVT proph w sc Heparin  13.  Dispo: Still probably needs a couple of days  Subjective:   Still coughing & wheezing. Still intermittent HA.       Cardiovascular ROS: no chest pain ,  orthopnea  Gastrointestinal ROS: no abdominal pain, nausea,vomiting, diarrhea, melena or hematechezia  Genito-Urinary ROS: no dysuria, trouble voiding, or hematuria  Objective:   Physical Exam:     Visit Vitals  BP (!) 153/102   Pulse 78   Temp 97.9 ??F (36.6 ??C)   Resp 18   Ht  (1.727 m)   Wt 91.6 kg (202 lb)   SpO2 96%   BMI 30.71 kg/m??    O2 Flow Rate (L/min): 2 l/min O2 Device: Nasal cannula    Temp (24hrs), Avg:97.9 ??F (36.6 ??C), Min:97.5 ??F (36.4 ??C), Max:98.4 ??F (36.9 ??C)    No intake/output data recorded.   03/14 1901 - 03/16 0700  In: 1020 [P.O.:1020]  Out: 4600 [Urine:4600]    General:  Alert, cooperative, no distress, appears stated age.   Lungs:   Slightly less Exp wheezes    :     Heart:  Regular rate and rhythm, S1, S2 normal, no murmur, click, rub or gallop.   Abdomen:   Soft, non-tender. Bowel sounds normal. No masses,  No organomegaly.   Extremities:  FROM, no LE edema   Pulses: 2+ and symmetric all extremities.   Skin: Skin color, texture, turgor normal. No rashes or lesions   :      Data Review:       24 Hour Results:  Recent Results (from the past 24 hour(s))   GLUCOSE, POC    Collection Time: 08/26/18 11:42 AM   Result Value Ref Range    Glucose (POC) 169 (H) 65 - 105 mg/dL   GLUCOSE, POC    Collection Time: 08/26/18  5:13  PM   Result Value Ref Range    Glucose (POC) 299 (H) 65 - 105 mg/dL   GLUCOSE, POC    Collection Time: 08/26/18  9:58 PM   Result Value Ref Range    Glucose (POC) 200 (H) 65 - 105 mg/dL   GLUCOSE, POC    Collection Time: 08/27/18  8:51 AM   Result Value Ref Range    Glucose (POC) 215 (H) 65 - 105 mg/dL       Problem List:  Problem List as of 08/27/2018 Date Reviewed: 24-Aug-2018          Codes Class Noted - Resolved    Respiratory failure with hypoxia (HCC) ICD-10-CM: J96.91  ICD-9-CM: 518.81  08/22/2018 - Present        Headache ICD-10-CM: R51  ICD-9-CM: 784.0  08/05/2018 - Present        Abdominal pain ICD-10-CM: R10.9  ICD-9-CM: 789.00  02/04/2018 - Present  SIRS (systemic inflammatory response syndrome) (HCC) ICD-10-CM: R65.10  ICD-9-CM: 995.90  10/09/2017 - Present        Hyperkalemia ICD-10-CM: E87.5  ICD-9-CM: 276.7  09/08/2017 - Present        Obtunded ICD-10-CM: R40.1  ICD-9-CM: 780.09  09/08/2017 - Present        Acute renal failure (ARF) (HCC) ICD-10-CM: N17.9  ICD-9-CM: 584.9  09/08/2017 - Present        Septic shock (HCC) ICD-10-CM: A41.9, R65.21  ICD-9-CM: 038.9, 785.52, 995.92  09/08/2017 - Present        Metabolic encephalopathy ICD-10-CM: G93.41  ICD-9-CM: 348.31  09/08/2017 - Present        Dehydration ICD-10-CM: E86.0  ICD-9-CM: 276.51  08/07/2017 - Present        COPD with acute exacerbation (HCC) ICD-10-CM: J44.1  ICD-9-CM: 491.21  08/07/2017 - Present        Acute-on-chronic kidney injury (HCC) ICD-10-CM: N17.9, N18.9  ICD-9-CM: 584.9, 585.9  08/07/2017 - Present        Chest pain ICD-10-CM: R07.9  ICD-9-CM: 786.50  06/15/2017 - Present        Dyspnea ICD-10-CM: R06.00  ICD-9-CM: 786.09  06/15/2017 - Present        Type 2 diabetes mellitus with diabetic neuropathy (HCC) ICD-10-CM: E11.40  ICD-9-CM: 250.60, 357.2  12/07/2016 - Present        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  ICD-9-CM: 569.89  08/17/2016 - Present        Cannabinoid hyperemesis syndrome (HCC) ICD-10-CM: F12.988  ICD-9-CM: 536.2, 305.20   08/17/2016 - Present        Gastritis ICD-10-CM: K29.70  ICD-9-CM: 535.50  08/16/2016 - Present        Nausea & vomiting ICD-10-CM: R11.2  ICD-9-CM: 787.01  08/16/2016 - Present        Asthma with acute exacerbation ICD-10-CM: J45.901  ICD-9-CM: 493.92  08/04/2016 - Present        Lactic acidosis ICD-10-CM: E87.2  ICD-9-CM: 276.2  07/24/2016 - Present        Leukocytosis ICD-10-CM: D72.829  ICD-9-CM: 288.60  07/24/2016 - Present        Sepsis (HCC) ICD-10-CM: A41.9  ICD-9-CM: 038.9, 995.91  07/24/2016 - Present        Asthma exacerbation ICD-10-CM: J45.901  ICD-9-CM: 493.92  07/24/2016 - Present        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: E11.21  ICD-9-CM: 250.40, 583.81  06/12/2016 - Present        Sacroiliitis (HCC) ICD-10-CM: M46.1  ICD-9-CM: 720.2  11/25/2015 - Present        Essential hypertension ICD-10-CM: I10  ICD-9-CM: 401.9  10/06/2015 - Present        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  ICD-9-CM: 721.3  09/15/2015 - Present        Lumbar and sacral osteoarthritis ICD-10-CM: M47.817  ICD-9-CM: 721.3  09/15/2015 - Present        Chronic pain syndrome ICD-10-CM: G89.4  ICD-9-CM: 338.4  09/15/2015 - Present        Type 2 diabetes mellitus with hyperglycemia, without long-term current use of insulin (HCC) ICD-10-CM: E11.65  ICD-9-CM: 250.00, 790.29  08/20/2015 - Present        Acute colitis ICD-10-CM: K52.9  ICD-9-CM: 558.9  07/02/2015 - Present        Acute hyperglycemia ICD-10-CM: R73.9  ICD-9-CM: 790.29  07/02/2015 - Present        Accelerated hypertension ICD-10-CM: [employer]  ICD-9-CM: 401.0  04/08/2015 - Present        Severe headache ICD-10-CM: R51  ICD-9-CM: 784.0  04/07/2015 - Present        Osteoarthritis of hips, 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  ICD-9-CM: 715.95  01/29/2015 - Present        PTSD (post-traumatic stress disorder) (Chronic) ICD-10-CM: F43.10  ICD-9-CM: 309.81  01/29/2015 - Present        Severe hypertension (Chronic) ICD-10-CM: I10  ICD-9-CM: 401.9   07/21/2014 - Present        RESOLVED: COPD exacerbation (HCC) ICD-10-CM: J44.1  ICD-9-CM: 491.21  11/14/2017 - 08/08/2018        RESOLVED: New onset type 1 diabetes mellitus, uncontrolled (HCC) ICD-10-CM: E10.65  ICD-9-CM: 250.03  07/02/2015 - 10/06/2015        RESOLVED: Recurrent major depressive disorder, in full remission (HCC) ICD-10-CM: F33.42  ICD-9-CM: 296.36  12/02/2014 - 12/02/2014        RESOLVED: Foreign body in colon ICD-10-CM: T18.4XXA  ICD-9-CM: 936  08/14/2014 - 01/29/2015        RESOLVED: Advanced care planning/counseling discussion ICD-10-CM: Z71.89  ICD-9-CM: V65.49  07/21/2014 - 01/29/2015    Overview Signed 07/21/2014  3:46 PM by Littie Deeds     Patient given [place] application               RESOLVED: Hip pain, chronic ICD-10-CM: M25.559, G89.29  ICD-9-CM: 719.45, 338.29  07/21/2014 - 01/29/2015               Medications reviewed  Current Facility-Administered Medications   Medication Dose Route Frequency   ??? methylPREDNISolone (PF) (SOLU-MEDROL) injection 60 mg  60 mg IntraVENous Q8H   ??? hydrOXYzine HCL (ATARAX) tablet 25 mg  25 mg Oral TID PRN   ??? albuterol (PROVENTIL VENTOLIN) nebulizer solution 2.5 mg  2.5 mg Nebulization Q4H RT   ??? cloNIDine HCL (CATAPRES) tablet 0.3 mg  0.3 mg Oral BID   ??? ketorolac (TORADOL) injection 15 mg  15 mg IntraVENous Q6H PRN   ??? QUEtiapine SR (SEROquel XR) tablet 150 mg  150 mg Oral QHS   ??? revefenacin (YUPELRI) nebulizer solution 175 mcg  175 mcg Nebulization DAILY   ??? dextromethorphan (DELSYM) 30 mg/5 mL syrup 30 mg  30 mg Oral Q12H PRN   ??? dilTIAZem CD (CARDIZEM CD) capsule 180 mg  180 mg Oral DAILY   ??? gabapentin (NEURONTIN) capsule 300 mg  300 mg Oral TID   ??? losartan (COZAAR) tablet 12.5 mg  12.5 mg Oral DAILY   ??? montelukast (SINGULAIR) tablet 10 mg  10 mg Oral QHS   ??? naloxone (NARCAN) injection 0.1 mg  0.1 mg IntraVENous PRN   ??? acetaminophen (TYLENOL) tablet 650 mg  650 mg Oral Q4H PRN    Or   ??? acetaminophen (TYLENOL) solution 650 mg  650 mg Oral Q4H PRN     Or   ??? acetaminophen (TYLENOL) suppository 650 mg  650 mg Rectal Q4H PRN   ??? HYDROcodone-acetaminophen (NORCO) 5-325 mg per tablet 1 Tab  1 Tab Oral Q4H PRN   ??? ondansetron (ZOFRAN) injection 4 mg  4 mg IntraVENous Q4H PRN   ??? heparin (porcine) injection 5,000 Units  5,000 Units SubCUTAneous Q8H   ??? arformoteroL (BROVANA) neb solution 15 mcg  15 mcg Nebulization BID RT   ??? budesonide (PULMICORT) 500 mcg/2 ml nebulizer suspension  500 mcg Nebulization BID RT   ??? albuterol (PROVENTIL VENTOLIN) nebulizer solution 2.5 mg  2.5 mg  Nebulization Q2H PRN   ??? dextrose (D50W) injection syrg 5-25 g  10-50 mL IntraVENous PRN   ??? glucagon (GLUCAGEN) injection 1 mg  1 mg IntraMUSCular PRN   ??? insulin glargine (LANTUS) injection 1-100 Units  1-100 Units SubCUTAneous QHS   ??? insulin lispro (HUMALOG) injection 1-100 Units  1-100 Units SubCUTAneous AC&HS   ??? insulin lispro (HUMALOG) injection 1-100 Units  1-100 Units SubCUTAneous PRN        Care Plan discussed with: pt     Total time spent with patient: 25 minutes.    Dorothe Pea, MD  August 27, 2018  1135

## 2018-08-28 ENCOUNTER — Inpatient Hospital Stay: Admit: 2018-08-28 | Payer: MEDICARE | Primary: Physician Assistant

## 2018-08-28 LAB — POC BLOOD GAS + LACTIC ACID
BASE EXCESS: 1 mmol/L (ref <=3)
BICARBONATE: 26.5 mmol/L — ABNORMAL HIGH (ref 18.0–26.0)
Base Excess: 1 mmol/L (ref <=3)
CO2 Total: 28 mmol/L (ref 24–29)
CO2, TOTAL: 28 mmol/L (ref 24–29)
EPAP/CPAP/PEEP: 8
EPAP/CPAP/PEEP: 8
FIO2: 30
FIO2: 30
HCO3: 26.5 mmol/L — ABNORMAL HIGH (ref 18.0–26.0)
IPAP/PIP: 18
IPAP/PIP: 18
O2 SAT: 95 % (ref 90–100)
O2 Sat: 95 % (ref 90–100)
PCO2: 43.7 mm Hg (ref 35.0–45.0)
PCO2: 43.7 mm Hg (ref 35.0–45.0)
PO2: 76 mm Hg (ref 75–100)
PO2: 76 mm Hg (ref 75–100)
Patient temp.: 97.3
Patient temperature: 97.3
RESPIRATORY RATE: 25
Respiratory Rate: 25
SET RATE: 15
Set Rate: 15
VTEXP, VTEXP: 597
VTEXP: 597
pH: 7.388 (ref 7.350–7.450)
pH: 7.388 (ref 7.350–7.450)

## 2018-08-28 LAB — EKG 12-LEAD
Atrial Rate: 120 {beats}/min
P Axis: 72 degrees
P-R Interval: 144 ms
Q-T Interval: 298 ms
QRS Duration: 72 ms
QTc Calculation (Bazett): 421 ms
R Axis: 70 degrees
T Axis: 66 degrees
Ventricular Rate: 120 {beats}/min

## 2018-08-28 LAB — PROTIME-INR
INR: 1 (ref 0.1–1.1)
Protime: 11.7 seconds (ref 10.2–12.9)

## 2018-08-28 LAB — POCT GLUCOSE
POC Glucose: 193 mg/dL — ABNORMAL HIGH (ref 65–105)
POC Glucose: 279 mg/dL — ABNORMAL HIGH (ref 65–105)
POC Glucose: 286 mg/dL — ABNORMAL HIGH (ref 65–105)
POC Glucose: 325 mg/dL — ABNORMAL HIGH (ref 65–105)

## 2018-08-28 LAB — GLUCOSE, POC
Glucose (POC): 193 mg/dL — ABNORMAL HIGH (ref 65–105)
Glucose (POC): 279 mg/dL — ABNORMAL HIGH (ref 65–105)
Glucose (POC): 286 mg/dL — ABNORMAL HIGH (ref 65–105)
Glucose (POC): 325 mg/dL — ABNORMAL HIGH (ref 65–105)

## 2018-08-28 LAB — PROTHROMBIN TIME + INR
INR: 1 (ref 0.1–1.1)
Prothrombin time: 11.7 seconds (ref 10.2–12.9)

## 2018-08-28 LAB — EKG, 12 LEAD, INITIAL
Atrial Rate: 120 {beats}/min
Calculated P Axis: 72 degrees
Calculated R Axis: 70 degrees
Calculated T Axis: 66 degrees
P-R Interval: 144 ms
Q-T Interval: 298 ms
QRS Duration: 72 ms
QTC Calculation (Bezet): 421 ms
Ventricular Rate: 120 {beats}/min

## 2018-08-28 MED ORDER — MAGNESIUM SULFATE 2 GRAM/50 ML IVPB
2 gram/50 mL (4 %) | Freq: Once | INTRAVENOUS | Status: AC
Start: 2018-08-28 — End: 2018-08-28
  Administered 2018-08-28: 16:00:00 via INTRAVENOUS

## 2018-08-28 MED ORDER — NALOXONE 0.4 MG/ML INJECTION
0.4 mg/mL | INTRAMUSCULAR | Status: DC | PRN
Start: 2018-08-28 — End: 2018-08-28

## 2018-08-28 MED ORDER — MIDAZOLAM 1 MG/ML IJ SOLN
1 mg/mL | INTRAMUSCULAR | Status: AC
Start: 2018-08-28 — End: 2018-08-29

## 2018-08-28 MED ORDER — FLUTICASONE 50 MCG/ACTUATION NASAL SPRAY, SUSP
50 mcg/actuation | Freq: Two times a day (BID) | NASAL | Status: DC
Start: 2018-08-28 — End: 2018-09-07
  Administered 2018-08-29 – 2018-09-07 (×16): via NASAL

## 2018-08-28 MED ORDER — DOCUSATE SODIUM 100 MG CAP
100 mg | Freq: Two times a day (BID) | ORAL | Status: DC
Start: 2018-08-28 — End: 2018-09-07
  Administered 2018-08-29 – 2018-09-07 (×16): via ORAL

## 2018-08-28 MED ORDER — LORAZEPAM 2 MG/ML IJ SOLN
2 mg/mL | Freq: Once | INTRAMUSCULAR | Status: AC
Start: 2018-08-28 — End: 2018-08-28
  Administered 2018-08-28: 14:00:00 via INTRAVENOUS

## 2018-08-28 MED ORDER — ACETYLCYSTEINE 10 % (100 MG/ML) SOLN
100 mg/mL (10 %) | Freq: Four times a day (QID) | Status: DC
Start: 2018-08-28 — End: 2018-08-28

## 2018-08-28 MED ORDER — FLUMAZENIL 0.1 MG/ML IV SOLN
0.1 mg/mL | INTRAVENOUS | Status: DC | PRN
Start: 2018-08-28 — End: 2018-08-28

## 2018-08-28 MED ORDER — GUAIFENESIN 600 MG TABLET,EXTENDED RELEASE BIPHASIC 12 HR
600 mg | Freq: Two times a day (BID) | ORAL | Status: DC
Start: 2018-08-28 — End: 2018-09-06
  Administered 2018-08-28 – 2018-09-06 (×19): via ORAL

## 2018-08-28 MED ORDER — ACETYLCYSTEINE 10 % (100 MG/ML) SOLN
100 mg/mL (10 %) | Freq: Three times a day (TID) | Status: DC
Start: 2018-08-28 — End: 2018-09-01
  Administered 2018-08-28 – 2018-08-31 (×11): via RESPIRATORY_TRACT

## 2018-08-28 MED ORDER — ALBUTEROL SULFATE 0.083 % (0.83 MG/ML) SOLN FOR INHALATION
2.5 mg /3 mL (0.083 %) | RESPIRATORY_TRACT | Status: DC | PRN
Start: 2018-08-28 — End: 2018-08-29

## 2018-08-28 MED ORDER — HYDROCODONE-ACETAMINOPHEN 5 MG-325 MG TAB
5-325 mg | ORAL | Status: DC | PRN
Start: 2018-08-28 — End: 2018-09-06
  Administered 2018-08-28 – 2018-09-04 (×7): via ORAL

## 2018-08-28 MED ORDER — HYDROMORPHONE 1 MG/ML INJECTION SOLUTION
1 mg/mL | INTRAMUSCULAR | Status: DC | PRN
Start: 2018-08-28 — End: 2018-09-06
  Administered 2018-08-28 – 2018-09-06 (×53): via INTRAVENOUS

## 2018-08-28 MED ORDER — FENTANYL CITRATE (PF) 50 MCG/ML IJ SOLN
50 mcg/mL | INTRAMUSCULAR | Status: DC | PRN
Start: 2018-08-28 — End: 2018-08-28
  Administered 2018-08-28: 19:00:00 via INTRAVENOUS

## 2018-08-28 MED ORDER — FENTANYL CITRATE (PF) 50 MCG/ML IJ SOLN
50 mcg/mL | INTRAMUSCULAR | Status: AC
Start: 2018-08-28 — End: 2018-08-28
  Administered 2018-08-28: 19:00:00 via INTRAVENOUS

## 2018-08-28 MED ORDER — LORAZEPAM 2 MG/ML IJ SOLN
2 mg/mL | Freq: Four times a day (QID) | INTRAMUSCULAR | Status: DC | PRN
Start: 2018-08-28 — End: 2018-09-06

## 2018-08-28 MED ORDER — METHYLPREDNISOLONE (PF) 125 MG/2 ML IJ SOLR
125 mg/2 mL | Freq: Once | INTRAMUSCULAR | Status: AC
Start: 2018-08-28 — End: 2018-08-28
  Administered 2018-08-28: 14:00:00 via INTRAVENOUS

## 2018-08-28 MED ORDER — LIDOCAINE HCL 1 % (10 MG/ML) IJ SOLN
10 mg/mL (1 %) | INTRAMUSCULAR | Status: DC | PRN
Start: 2018-08-28 — End: 2018-08-28
  Administered 2018-08-28: 19:00:00 via SUBCUTANEOUS

## 2018-08-28 MED ORDER — IPRATROPIUM-ALBUTEROL 2.5 MG-0.5 MG/3 ML NEB SOLUTION
2.5 mg-0.5 mg/3 ml | RESPIRATORY_TRACT | Status: DC
Start: 2018-08-28 — End: 2018-09-01
  Administered 2018-08-28 – 2018-09-01 (×21): via RESPIRATORY_TRACT

## 2018-08-28 MED ORDER — MIDAZOLAM 1 MG/ML IJ SOLN
1 mg/mL | INTRAMUSCULAR | Status: DC | PRN
Start: 2018-08-28 — End: 2018-08-28

## 2018-08-28 MED ORDER — METHYLPREDNISOLONE (PF) 40 MG/ML IJ SOLR
40 mg/mL | Freq: Four times a day (QID) | INTRAMUSCULAR | Status: DC
Start: 2018-08-28 — End: 2018-08-29
  Administered 2018-08-28 – 2018-08-29 (×4): via INTRAVENOUS

## 2018-08-28 MED ORDER — CETIRIZINE 5 MG TAB
5 mg | Freq: Every day | ORAL | Status: DC
Start: 2018-08-28 — End: 2018-09-07
  Administered 2018-08-28 – 2018-09-07 (×11): via ORAL

## 2018-08-28 MED FILL — INSULIN GLARGINE 100 UNIT/ML INJECTION: 100 unit/mL | SUBCUTANEOUS | Qty: 19

## 2018-08-28 MED FILL — GABAPENTIN 300 MG CAP: 300 mg | ORAL | Qty: 1

## 2018-08-28 MED FILL — FLUTICASONE 50 MCG/ACTUATION NASAL SPRAY, SUSP: 50 mcg/actuation | NASAL | Qty: 16

## 2018-08-28 MED FILL — MUCINEX 600 MG TABLET, EXTENDED RELEASE: 600 mg | ORAL | Qty: 2

## 2018-08-28 MED FILL — MONTELUKAST 10 MG TAB: 10 mg | ORAL | Qty: 1

## 2018-08-28 MED FILL — DILTIAZEM ER 180 MG 24 HR CAP: 180 mg | ORAL | Qty: 1

## 2018-08-28 MED FILL — IPRATROPIUM-ALBUTEROL 2.5 MG-0.5 MG/3 ML NEB SOLUTION: 2.5 mg-0.5 mg/3 ml | RESPIRATORY_TRACT | Qty: 3

## 2018-08-28 MED FILL — SOLU-MEDROL (PF) 40 MG/ML SOLUTION FOR INJECTION: 40 mg/mL | INTRAMUSCULAR | Qty: 2

## 2018-08-28 MED FILL — CLONIDINE 0.1 MG TAB: 0.1 mg | ORAL | Qty: 3

## 2018-08-28 MED FILL — QUETIAPINE SR 50 MG 24 HR TAB: 50 mg | ORAL | Qty: 3

## 2018-08-28 MED FILL — BUDESONIDE 0.5 MG/2 ML NEB SUSPENSION: 0.5 mg/2 mL | RESPIRATORY_TRACT | Qty: 1

## 2018-08-28 MED FILL — HEPARIN (PORCINE) 5,000 UNIT/ML IJ SOLN: 5000 unit/mL | INTRAMUSCULAR | Qty: 1

## 2018-08-28 MED FILL — BROVANA 15 MCG/2 ML SOLUTION FOR NEBULIZATION: 15 mcg/2 mL | RESPIRATORY_TRACT | Qty: 1

## 2018-08-28 MED FILL — HYDROMORPHONE 1 MG/ML INJECTION SOLUTION: 1 mg/mL | INTRAMUSCULAR | Qty: 1

## 2018-08-28 MED FILL — MIDAZOLAM 1 MG/ML IJ SOLN: 1 mg/mL | INTRAMUSCULAR | Qty: 4

## 2018-08-28 MED FILL — HYDROCODONE-ACETAMINOPHEN 5 MG-325 MG TAB: 5-325 mg | ORAL | Qty: 2

## 2018-08-28 MED FILL — ALBUTEROL SULFATE 0.083 % (0.83 MG/ML) SOLN FOR INHALATION: 2.5 mg /3 mL (0.083 %) | RESPIRATORY_TRACT | Qty: 1

## 2018-08-28 MED FILL — HYDROXYZINE 25 MG TAB: 25 mg | ORAL | Qty: 1

## 2018-08-28 MED FILL — KETOROLAC TROMETHAMINE 15 MG/ML INJECTION: 15 mg/mL | INTRAMUSCULAR | Qty: 1

## 2018-08-28 MED FILL — DOCUSATE SODIUM 100 MG CAP: 100 mg | ORAL | Qty: 1

## 2018-08-28 MED FILL — LOSARTAN 25 MG TAB: 25 mg | ORAL | Qty: 1

## 2018-08-28 MED FILL — CETIRIZINE 5 MG TAB: 5 mg | ORAL | Qty: 2

## 2018-08-28 MED FILL — FENTANYL CITRATE (PF) 50 MCG/ML IJ SOLN: 50 mcg/mL | INTRAMUSCULAR | Qty: 4

## 2018-08-28 MED FILL — LORAZEPAM 2 MG/ML IJ SOLN: 2 mg/mL | INTRAMUSCULAR | Qty: 1

## 2018-08-28 MED FILL — MAGNESIUM SULFATE 2 GRAM/50 ML IVPB: 2 gram/50 mL (4 %) | INTRAVENOUS | Qty: 50

## 2018-08-28 MED FILL — ACETAMINOPHEN 325 MG TABLET: 325 mg | ORAL | Qty: 2

## 2018-08-28 MED FILL — ACETYLCYSTEINE 10 % (100 MG/ML) SOLN: 100 mg/mL (10 %) | Qty: 4

## 2018-08-28 MED FILL — INSULIN LISPRO 100 UNIT/ML INJECTION: 100 unit/mL | SUBCUTANEOUS | Qty: 2

## 2018-08-28 MED FILL — YUPELRI 175 MCG/3 ML SOLUTION FOR NEBULIZATION: 175 mcg/3 mL | RESPIRATORY_TRACT | Qty: 1
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
## 2018-08-28 NOTE — Progress Notes (Signed)
Volunteer Chaplain Visit this date.  Prayer provided.

## 2018-08-28 NOTE — Progress Notes (Addendum)
I d/w DR. Thomas Carter,    Chest xray shows left sided pneumothorax most likely due to COPD and asthma.    CTS to evaluate for chest tube and further workup.

## 2018-08-28 NOTE — Progress Notes (Signed)
PAGER ID: [phone]   MESSAGE: [phone] [name] [name]: need PIV placed, tried X2. Thanks--[phone]

## 2018-08-28 NOTE — Procedures (Signed)
VASCULAR & INTERVENTIONAL RADIOLOGY  CT CHEST TUBE PLACEMENT    Informed consent obtained.   Left 9-French chest tube placed.    Catheter connected to continuous low pressure [facility] suction.    Chest x-ray now and daily in AM.   Full radiology report to follow.      Jnaya Butrick D Avyana Puffenbarger MD  Director Interventional Radiology  August 28, 2018  3:06 PM

## 2018-08-28 NOTE — Progress Notes (Signed)
Hospitalist Progress Note         [facility]    Daily Progress Note: 08/28/2018    Assessment/Plan:   1.  Acute hypoxic respiratory failure: she had an RRT called on her this morning because she went into acute hypoxic respiratory failure tachycardia and tachypnea.  She appeared to be quite tight with only minimal wheezing at the bases but she appeared to be quite anxious.  -We will need to transfer her to stepdown at this time I will increase her Solu-Medrol to-25 mg IV x1 and restart Solu-Medrol 60 mg IV every 6 hours I have increased her DuoNeb to every 4 hours and make albuterol nebulizer every 2 hours and started Mucomyst 40 mg every 6 hours and Mucinex 1200 mg twice daily as the patient does have a seasonal allergy component.  -Started Ativan 0.5 mg IV every 6 hours as needed for anxiety as the patient does have PTSD.  -We will check a chest x-ray.  -I discussed with nursing staff respiratory therapy and stepdown the patient will need to be transferred to stepdown at this time.  -We will check an ABG given her acute hypoxic change but the patient is alert and oriented x3 and mentating very well.  - will check a chest xray to make certain there isnt an infiltrate contributing to her distress.    Critical care time 35 minutes from 0 [phone]      2.  Acute exacerbation COPD. Called earlier by RT who noted Pt had been changed to Prednisone 40mg  every day, resumed Solumedrol 60mg  q 8h  3.  Acute bronchitis  4.  HTN  5.  T2DM  6.  Left upper lobe lung nodule, will need continued follow-up   7.  Dyslipidemia  8.  PTSD, she was [place], has had respiratory problems since.  RN notes anxiety @ times. Try prn Hydroxyzine  9.  Peripheral neuropathy  10.  GERD.    11.  DJD  12.  DVT proph w sc Heparin  13.  Dispo: Still probably needs a couple of days  Subjective:   Interval history:    3/17: Rapid response team was called because she patient went into acute  hypoxic respiratory failure and began to have tach tachycardia and tachypnea the patient had to be placed back on BiPAP at this time.  An ABG chest x-ray will be obtained we will have to escalate her nebulizer treatments.  We will need to move her to the stepdown at this time.  Respiratory is on board.  She does appear to be quite anxious and therefore gave Ativan 0.5 mg IV x1 and will make Ativan 0.5 mg IV every 6 hours as needed as patient does have a history of PTSD she was exposed to respiratory problems since we will treat started on 11.      Cardiovascular ROS: no chest pain ,  orthopnea  Gastrointestinal ROS: no abdominal pain, nausea,vomiting, diarrhea, melena or hematechezia  Genito-Urinary ROS: no dysuria, trouble voiding, or hematuria  Objective:   Physical Exam:     Visit Vitals  BP (!) 153/92 (BP 1 Location: Left arm, BP Patient Position: Supine)   Pulse 85   Temp 97.3 ??F (36.3 ??C)   Resp 18   Ht 5\' 8"  (1.727 m)   Wt 91.6 kg (202 lb)   SpO2 (P) 92%   BMI 30.71 kg/m??    O2 Flow Rate (L/min): 2 l/min O2 Device: Nasal cannula  Temp (24hrs), Avg:97.8 ??F (36.6 ??C), Min:97.3 ??F (36.3 ??C), Max:98.4 ??F (36.9 ??C)    03/17 0701 - 03/17 1900  In: 480 [P.O.:480]  Out: -    03/15 1901 - 03/17 0700  In: 1240 [P.O.:1240]  Out: 3900 [Urine:3900]    General:  Alert, cooperative, no distress, appears stated age.   Lungs:    Still significant exp wheezes    :     Heart:  Regular rate and rhythm, S1, S2 normal, no murmur, click, rub or gallop.   Abdomen:   Soft, non-tender. Bowel sounds normal. No masses,  No organomegaly.   Extremities:  FROM, no LE edema   Pulses: 2+ and symmetric all extremities.   Skin: Skin color, texture, turgor normal. No rashes or lesions   :      Data Review:       24 Hour Results:  Recent Results (from the past 24 hour(s))   GLUCOSE, POC    Collection Time: 08/27/18 12:08 PM   Result Value Ref Range    Glucose (POC) 238 (H) 65 - 105 mg/dL   GLUCOSE, POC    Collection Time: 08/27/18  5:26 PM    Result Value Ref Range    Glucose (POC) 190 (H) 65 - 105 mg/dL   GLUCOSE, POC    Collection Time: 08/27/18  9:23 PM   Result Value Ref Range    Glucose (POC) 193 (H) 65 - 105 mg/dL   GLUCOSE, POC    Collection Time: 08/28/18  7:42 AM   Result Value Ref Range    Glucose (POC) 279 (H) 65 - 105 mg/dL       Problem List:  Problem List as of 08/28/2018 Date Reviewed: 08/25/2018          Codes Class Noted - Resolved    Respiratory failure with hypoxia [facility]) ICD-10-CM: J96.91  ICD-9-CM: 518.81  08/22/2018 - Present        Headache ICD-10-CM: R51  ICD-9-CM: 784.0  08/05/2018 - Present        Abdominal pain ICD-10-CM: R10.9  ICD-9-CM: 789.00  02/04/2018 - Present        SIRS (systemic inflammatory response syndrome) (HCC) ICD-10-CM: R65.10  ICD-9-CM: 995.90  10/09/2017 - Present        Hyperkalemia ICD-10-CM: E87.5  ICD-9-CM: 276.7  09/08/2017 - Present        Obtunded ICD-10-CM: R40.1  ICD-9-CM: 780.09  09/08/2017 - Present        Acute renal failure (ARF) (HCC) ICD-10-CM: N17.9  ICD-9-CM: 584.9  09/08/2017 - Present        Septic shock (HCC) ICD-10-CM: A41.9, R65.21  ICD-9-CM: 038.9, 785.52, 995.92  09/08/2017 - Present        Metabolic encephalopathy ICD-10-CM: G93.41  ICD-9-CM: 348.31  09/08/2017 - Present        Dehydration ICD-10-CM: E86.0  ICD-9-CM: 276.51  08/07/2017 - Present        COPD with acute exacerbation (HCC) ICD-10-CM: J44.1  ICD-9-CM: 491.21  08/07/2017 - Present        Acute-on-chronic kidney injury (HCC) ICD-10-CM: N17.9, N18.9  ICD-9-CM: 584.9, 585.9  08/07/2017 - Present        Chest pain ICD-10-CM: R07.9  ICD-9-CM: 786.50  06/15/2017 - Present        Dyspnea ICD-10-CM: R06.00  ICD-9-CM: 786.09  06/15/2017 - Present        Type 2 diabetes mellitus with diabetic neuropathy (HCC) ICD-10-CM: E11.40  ICD-9-CM: 250.60, 357.2  12/07/2016 - Present  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  ICD-9-CM: 569.89  08/17/2016 - Present        Cannabinoid hyperemesis syndrome (HCC) ICD-10-CM: F12.988   ICD-9-CM: 536.2, 305.20  08/17/2016 - Present        Gastritis ICD-10-CM: K29.70  ICD-9-CM: 535.50  08/16/2016 - Present        Nausea & vomiting ICD-10-CM: R11.2  ICD-9-CM: 787.01  08/16/2016 - Present        Asthma with acute exacerbation ICD-10-CM: J45.901  ICD-9-CM: 493.92  08/04/2016 - Present        Lactic acidosis ICD-10-CM: E87.2  ICD-9-CM: 276.2  07/24/2016 - Present        Leukocytosis ICD-10-CM: D72.829  ICD-9-CM: 288.60  07/24/2016 - Present        Sepsis (HCC) ICD-10-CM: A41.9  ICD-9-CM: 038.9, 995.91  07/24/2016 - Present        Asthma exacerbation ICD-10-CM: J45.901  ICD-9-CM: 493.92  07/24/2016 - Present        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: E11.21  ICD-9-CM: 250.40, 583.81  06/12/2016 - Present        Sacroiliitis (HCC) ICD-10-CM: M46.1  ICD-9-CM: 720.2  11/25/2015 - Present        Essential hypertension ICD-10-CM: I10  ICD-9-CM: 401.9  10/06/2015 - Present        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  ICD-9-CM: 721.3  09/15/2015 - Present        Lumbar and sacral osteoarthritis ICD-10-CM: M47.817  ICD-9-CM: 721.3  09/15/2015 - Present        Chronic pain syndrome ICD-10-CM: G89.4  ICD-9-CM: 338.4  09/15/2015 - Present        Type 2 diabetes mellitus with hyperglycemia, without long-term current use of insulin (HCC) ICD-10-CM: E11.65  ICD-9-CM: 250.00, 790.29  08/20/2015 - Present        Acute colitis ICD-10-CM: K52.9  ICD-9-CM: 558.9  07/02/2015 - Present        Acute hyperglycemia ICD-10-CM: R73.9  ICD-9-CM: 790.29  07/02/2015 - Present        Accelerated hypertension ICD-10-CM: I10  ICD-9-CM: 401.0  04/08/2015 - Present        Severe headache ICD-10-CM: R51  ICD-9-CM: 784.0  04/07/2015 - Present        Osteoarthritis of hips, 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  ICD-9-CM: 715.95  01/29/2015 - Present        PTSD (post-traumatic stress disorder) (Chronic) ICD-10-CM: F43.10  ICD-9-CM: 309.81  01/29/2015 - Present         Severe hypertension (Chronic) ICD-10-CM: I10  ICD-9-CM: 401.9  07/21/2014 - Present        RESOLVED: COPD exacerbation (HCC) ICD-10-CM: J44.1  ICD-9-CM: 491.21  11/14/2017 - 08/08/2018        RESOLVED: New onset type 1 diabetes mellitus, uncontrolled (HCC) ICD-10-CM: E10.65  ICD-9-CM: 250.03  07/02/2015 - 10/06/2015        RESOLVED: Recurrent major depressive disorder, in full remission (HCC) ICD-10-CM: F33.42  ICD-9-CM: 296.36  12/02/2014 - 12/02/2014        RESOLVED: Foreign body in colon ICD-10-CM: T18.4XXA  ICD-9-CM: 936  08/14/2014 - 01/29/2015        RESOLVED: Advanced care planning/counseling discussion ICD-10-CM: Z71.89  ICD-9-CM: V65.49  07/21/2014 - 01/29/2015    Overview Signed 07/21/2014  3:46 PM by Littie Deeds     Patient given [place] application               RESOLVED: Hip pain,  chronic ICD-10-CM: M25.559, G89.29  ICD-9-CM: 719.45, 338.29  07/21/2014 - 01/29/2015               Medications reviewed  Current Facility-Administered Medications   Medication Dose Route Frequency   ??? albuterol-ipratropium (DUO-NEB) 2.5 MG-0.5 MG/3 ML  3 mL Nebulization Q4H RT   ??? albuterol (PROVENTIL VENTOLIN) nebulizer solution 2.5 mg  2.5 mg Nebulization Q2H PRN   ??? methylPREDNISolone (PF) (SOLU-MEDROL) injection 60 mg  60 mg IntraVENous Q6H   ??? acetylcysteine (MUCOMYST) 100 mg/mL (10 %) nebulizer solution 400 mg  4 mL Nebulization Q6H   ??? guaiFENesin ER (MUCINEX) tablet 1,200 mg  1,200 mg Oral Q12H   ??? LORazepam (ATIVAN) injection 0.5 mg  0.5 mg IntraVENous Q6H PRN   ??? cetirizine (ZYRTEC) tablet 10 mg  10 mg Oral DAILY   ??? fluticasone propionate (FLONASE) 50 mcg/actuation nasal spray 2 Spray  2 Spray Both Nostrils BID   ??? hydrOXYzine HCL (ATARAX) tablet 25 mg  25 mg Oral TID PRN   ??? cloNIDine HCL (CATAPRES) tablet 0.3 mg  0.3 mg Oral BID   ??? ketorolac (TORADOL) injection 15 mg  15 mg IntraVENous Q6H PRN   ??? QUEtiapine SR (SEROquel XR) tablet 150 mg  150 mg Oral QHS    ??? revefenacin (YUPELRI) nebulizer solution 175 mcg  175 mcg Nebulization DAILY   ??? dextromethorphan (DELSYM) 30 mg/5 mL syrup 30 mg  30 mg Oral Q12H PRN   ??? dilTIAZem CD (CARDIZEM CD) capsule 180 mg  180 mg Oral DAILY   ??? gabapentin (NEURONTIN) capsule 300 mg  300 mg Oral TID   ??? losartan (COZAAR) tablet 12.5 mg  12.5 mg Oral DAILY   ??? montelukast (SINGULAIR) tablet 10 mg  10 mg Oral QHS   ??? naloxone (NARCAN) injection 0.1 mg  0.1 mg IntraVENous PRN   ??? acetaminophen (TYLENOL) tablet 650 mg  650 mg Oral Q4H PRN    Or   ??? acetaminophen (TYLENOL) solution 650 mg  650 mg Oral Q4H PRN    Or   ??? acetaminophen (TYLENOL) suppository 650 mg  650 mg Rectal Q4H PRN   ??? HYDROcodone-acetaminophen (NORCO) 5-325 mg per tablet 1 Tab  1 Tab Oral Q4H PRN   ??? ondansetron (ZOFRAN) injection 4 mg  4 mg IntraVENous Q4H PRN   ??? heparin (porcine) injection 5,000 Units  5,000 Units SubCUTAneous Q8H   ??? arformoteroL (BROVANA) neb solution 15 mcg  15 mcg Nebulization BID RT   ??? budesonide (PULMICORT) 500 mcg/2 ml nebulizer suspension  500 mcg Nebulization BID RT   ??? albuterol (PROVENTIL VENTOLIN) nebulizer solution 2.5 mg  2.5 mg Nebulization Q2H PRN   ??? dextrose (D50W) injection syrg 5-25 g  10-50 mL IntraVENous PRN   ??? glucagon (GLUCAGEN) injection 1 mg  1 mg IntraMUSCular PRN   ??? insulin glargine (LANTUS) injection 1-100 Units  1-100 Units SubCUTAneous QHS   ??? insulin lispro (HUMALOG) injection 1-100 Units  1-100 Units SubCUTAneous AC&HS   ??? insulin lispro (HUMALOG) injection 1-100 Units  1-100 Units SubCUTAneous PRN        Care Plan discussed with: pt     Total time spent with patient: 25 minutes.    Darrin Nipper, MD  August 28, 2018  1045

## 2018-08-28 NOTE — Other (Signed)
Bedside shift change report given to Paz RN   (oncoming nurse) by gibson,maria jane RN (offgoing nurse). Report included the following information SBAR.

## 2018-08-28 NOTE — Other (Signed)
Bedside and Verbal shift change report given to [name] [name]   (oncoming nurse) by Paz Merelos (offgoing nurse). Report included the following information SBAR and Kardex.
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
## 2018-08-28 NOTE — Progress Notes (Signed)
I d/w DR. Kallie Edward,    Chest xray shows left sided pneumothorax most likely due to COPD and asthma.    CTS to evaluate for chest tube and further workup.

## 2018-08-28 NOTE — Procedures (Signed)
VASCULAR & INTERVENTIONAL RADIOLOGY  CT CHEST TUBE PLACEMENT    Informed consent obtained.   Left 9-French chest tube placed.    Catheter connected to continuous low pressure [facility] suction.    Chest x-ray now and daily in AM.   Full radiology report to follow.      Odelia Gage MD  Director Interventional Radiology  August 28, 2018  3:06 PM
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
## 2018-08-28 NOTE — Progress Notes (Signed)
PAGER ID: [phone]   MESSAGE: [phone] [name] [name]: need PIV placed, tried X2. Alveda Reasons [phone]

## 2018-08-28 NOTE — Progress Notes (Signed)
Hospitalist Progress Note         [facility]    Daily Progress Note: 08/28/2018    Assessment/Plan:   1.  Acute hypoxic respiratory failure: she had an RRT called on her this morning because she went into acute hypoxic respiratory failure tachycardia and tachypnea.  She appeared to be quite tight with only minimal wheezing at the bases but she appeared to be quite anxious.  -We will need to transfer her to stepdown at this time I will increase her Solu-Medrol to-25 mg IV x1 and restart Solu-Medrol 60 mg IV every 6 hours I have increased her DuoNeb to every 4 hours and make albuterol nebulizer every 2 hours and started Mucomyst 40 mg every 6 hours and Mucinex 1200 mg twice daily as the patient does have a seasonal allergy component.  -Started Ativan 0.5 mg IV every 6 hours as needed for anxiety as the patient does have PTSD.  -We will check a chest x-ray.  -I discussed with nursing staff respiratory therapy and stepdown the patient will need to be transferred to stepdown at this time.  -We will check an ABG given her acute hypoxic change but the patient is alert and oriented x3 and mentating very well.  - will check a chest xray to make certain there isnt an infiltrate contributing to her distress.    Critical care time 35 minutes from 0 [phone]      2.  Acute exacerbation COPD. Called earlier by RT who noted Pt had been changed to Prednisone 40mg  every day, resumed Solumedrol 60mg  q 8h  3.  Acute bronchitis  4.  HTN  5.  T2DM  6.  Left upper lobe lung nodule, will need continued follow-up   7.  Dyslipidemia  8.  PTSD, she was [place], has had respiratory problems since.  RN notes anxiety @ times. Try prn Hydroxyzine  9.  Peripheral neuropathy  10.  GERD.    11.  DJD  12.  DVT proph w sc Heparin  13.  Dispo: Still probably needs a couple of days  Subjective:   Interval history:    3/17: Rapid response team was called because she patient went into acute hypoxic respiratory failure and  began to have tach tachycardia and tachypnea the patient had to be placed back on BiPAP at this time.  An ABG chest x-ray will be obtained we will have to escalate her nebulizer treatments.  We will need to move her to the stepdown at this time.  Respiratory is on board.  She does appear to be quite anxious and therefore gave Ativan 0.5 mg IV x1 and will make Ativan 0.5 mg IV every 6 hours as needed as patient does have a history of PTSD she was exposed to respiratory problems since we will treat started on 11.      Cardiovascular ROS: no chest pain ,  orthopnea  Gastrointestinal ROS: no abdominal pain, nausea,vomiting, diarrhea, melena or hematechezia  Genito-Urinary ROS: no dysuria, trouble voiding, or hematuria  Objective:   Physical Exam:     Visit Vitals  BP (!) 153/92 (BP 1 Location: Left arm, BP Patient Position: Supine)   Pulse 85   Temp 97.3 ??F (36.3 ??C)   Resp 18   Ht 5\' 8"  (1.727 m)   Wt 91.6 kg (202 lb)   SpO2 (P) 92%   BMI 30.71 kg/m??    O2 Flow Rate (L/min): 2 l/min O2 Device: Nasal cannula  Temp (24hrs), Avg:97.8 ??F (36.6 ??C), Min:97.3 ??F (36.3 ??C), Max:98.4 ??F (36.9 ??C)    03/17 0701 - 03/17 1900  In: 480 [P.O.:480]  Out: -    03/15 1901 - 03/17 0700  In: 1240 [P.O.:1240]  Out: 3900 [Urine:3900]    General:  Alert, cooperative, no distress, appears stated age.   Lungs:    Still significant exp wheezes    :     Heart:  Regular rate and rhythm, S1, S2 normal, no murmur, click, rub or gallop.   Abdomen:   Soft, non-tender. Bowel sounds normal. No masses,  No organomegaly.   Extremities:  FROM, no LE edema   Pulses: 2+ and symmetric all extremities.   Skin: Skin color, texture, turgor normal. No rashes or lesions   :      Data Review:       24 Hour Results:  Recent Results (from the past 24 hour(s))   GLUCOSE, POC    Collection Time: 08/27/18 12:08 PM   Result Value Ref Range    Glucose (POC) 238 (H) 65 - 105 mg/dL   GLUCOSE, POC    Collection Time: 08/27/18  5:26 PM   Result Value Ref Range    Glucose  (POC) 190 (H) 65 - 105 mg/dL   GLUCOSE, POC    Collection Time: 08/27/18  9:23 PM   Result Value Ref Range    Glucose (POC) 193 (H) 65 - 105 mg/dL   GLUCOSE, POC    Collection Time: 08/28/18  7:42 AM   Result Value Ref Range    Glucose (POC) 279 (H) 65 - 105 mg/dL       Problem List:  Problem List as of 08/28/2018 Date Reviewed: 08-28-18          Codes Class Noted - Resolved    Respiratory failure with hypoxia [facility]) ICD-10-CM: J96.91  ICD-9-CM: 518.81  08/22/2018 - Present        Headache ICD-10-CM: R51  ICD-9-CM: 784.0  08/05/2018 - Present        Abdominal pain ICD-10-CM: R10.9  ICD-9-CM: 789.00  02/04/2018 - Present        SIRS (systemic inflammatory response syndrome) (HCC) ICD-10-CM: R65.10  ICD-9-CM: 995.90  10/09/2017 - Present        Hyperkalemia ICD-10-CM: E87.5  ICD-9-CM: 276.7  09/08/2017 - Present        Obtunded ICD-10-CM: R40.1  ICD-9-CM: 780.09  09/08/2017 - Present        Acute renal failure (ARF) (HCC) ICD-10-CM: N17.9  ICD-9-CM: 584.9  09/08/2017 - Present        Septic shock (HCC) ICD-10-CM: A41.9, R65.21  ICD-9-CM: 038.9, 785.52, 995.92  09/08/2017 - Present        Metabolic encephalopathy ICD-10-CM: G93.41  ICD-9-CM: 348.31  09/08/2017 - Present        Dehydration ICD-10-CM: E86.0  ICD-9-CM: 276.51  08/07/2017 - Present        COPD with acute exacerbation (HCC) ICD-10-CM: J44.1  ICD-9-CM: 491.21  08/07/2017 - Present        Acute-on-chronic kidney injury (HCC) ICD-10-CM: N17.9, N18.9  ICD-9-CM: 584.9, 585.9  08/07/2017 - Present        Chest pain ICD-10-CM: R07.9  ICD-9-CM: 786.50  06/15/2017 - Present        Dyspnea ICD-10-CM: R06.00  ICD-9-CM: 786.09  06/15/2017 - Present        Type 2 diabetes mellitus with diabetic neuropathy (HCC) ICD-10-CM: E11.40  ICD-9-CM: 250.60, 357.2  12/07/2016 - Present  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  ICD-9-CM: 569.89  08/17/2016 - Present        Cannabinoid hyperemesis syndrome (HCC) ICD-10-CM: F12.988  ICD-9-CM: 536.2, 305.20  08/17/2016 - Present        Gastritis  ICD-10-CM: K29.70  ICD-9-CM: 535.50  08/16/2016 - Present        Nausea & vomiting ICD-10-CM: R11.2  ICD-9-CM: 787.01  08/16/2016 - Present        Asthma with acute exacerbation ICD-10-CM: J45.901  ICD-9-CM: 493.92  08/04/2016 - Present        Lactic acidosis ICD-10-CM: E87.2  ICD-9-CM: 276.2  07/24/2016 - Present        Leukocytosis ICD-10-CM: D72.829  ICD-9-CM: 288.60  07/24/2016 - Present        Sepsis (HCC) ICD-10-CM: A41.9  ICD-9-CM: 038.9, 995.91  07/24/2016 - Present        Asthma exacerbation ICD-10-CM: J45.901  ICD-9-CM: 493.92  07/24/2016 - Present        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: E11.21  ICD-9-CM: 250.40, 583.81  06/12/2016 - Present        Sacroiliitis (HCC) ICD-10-CM: M46.1  ICD-9-CM: 720.2  11/25/2015 - Present        Essential hypertension ICD-10-CM: I10  ICD-9-CM: 401.9  10/06/2015 - Present        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  ICD-9-CM: 721.3  09/15/2015 - Present        Lumbar and sacral osteoarthritis ICD-10-CM: M47.817  ICD-9-CM: 721.3  09/15/2015 - Present        Chronic pain syndrome ICD-10-CM: G89.4  ICD-9-CM: 338.4  09/15/2015 - Present        Type 2 diabetes mellitus with hyperglycemia, without long-term current use of insulin (HCC) ICD-10-CM: E11.65  ICD-9-CM: 250.00, 790.29  08/20/2015 - Present        Acute colitis ICD-10-CM: K52.9  ICD-9-CM: 558.9  07/02/2015 - Present        Acute hyperglycemia ICD-10-CM: R73.9  ICD-9-CM: 790.29  07/02/2015 - Present        Accelerated hypertension ICD-10-CM: I10  ICD-9-CM: 401.0  04/08/2015 - Present        Severe headache ICD-10-CM: R51  ICD-9-CM: 784.0  04/07/2015 - Present        Osteoarthritis of hips, 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  ICD-9-CM: 715.95  01/29/2015 - Present        PTSD (post-traumatic stress disorder) (Chronic) ICD-10-CM: F43.10  ICD-9-CM: 309.81  01/29/2015 - Present        Severe hypertension (Chronic) ICD-10-CM: I10  ICD-9-CM: 401.9  07/21/2014 - Present        RESOLVED:  COPD exacerbation (HCC) ICD-10-CM: J44.1  ICD-9-CM: 491.21  11/14/2017 - 08/08/2018        RESOLVED: New onset type 1 diabetes mellitus, uncontrolled (HCC) ICD-10-CM: E10.65  ICD-9-CM: 250.03  07/02/2015 - 10/06/2015        RESOLVED: Recurrent major depressive disorder, in full remission (HCC) ICD-10-CM: F33.42  ICD-9-CM: 296.36  12/02/2014 - 12/02/2014        RESOLVED: Foreign body in colon ICD-10-CM: T18.4XXA  ICD-9-CM: 936  08/14/2014 - 01/29/2015        RESOLVED: Advanced care planning/counseling discussion ICD-10-CM: Z71.89  ICD-9-CM: V65.49  07/21/2014 - 01/29/2015    Overview Signed 07/21/2014  3:46 PM by Littie Deeds     Patient given [place] application               RESOLVED: Hip pain,  chronic ICD-10-CM: M25.559, G89.29  ICD-9-CM: 719.45, 338.29  07/21/2014 - 01/29/2015               Medications reviewed  Current Facility-Administered Medications   Medication Dose Route Frequency   ??? albuterol-ipratropium (DUO-NEB) 2.5 MG-0.5 MG/3 ML  3 mL Nebulization Q4H RT   ??? albuterol (PROVENTIL VENTOLIN) nebulizer solution 2.5 mg  2.5 mg Nebulization Q2H PRN   ??? methylPREDNISolone (PF) (SOLU-MEDROL) injection 60 mg  60 mg IntraVENous Q6H   ??? acetylcysteine (MUCOMYST) 100 mg/mL (10 %) nebulizer solution 400 mg  4 mL Nebulization Q6H   ??? guaiFENesin ER (MUCINEX) tablet 1,200 mg  1,200 mg Oral Q12H   ??? LORazepam (ATIVAN) injection 0.5 mg  0.5 mg IntraVENous Q6H PRN   ??? cetirizine (ZYRTEC) tablet 10 mg  10 mg Oral DAILY   ??? fluticasone propionate (FLONASE) 50 mcg/actuation nasal spray 2 Spray  2 Spray Both Nostrils BID   ??? hydrOXYzine HCL (ATARAX) tablet 25 mg  25 mg Oral TID PRN   ??? cloNIDine HCL (CATAPRES) tablet 0.3 mg  0.3 mg Oral BID   ??? ketorolac (TORADOL) injection 15 mg  15 mg IntraVENous Q6H PRN   ??? QUEtiapine SR (SEROquel XR) tablet 150 mg  150 mg Oral QHS   ??? revefenacin (YUPELRI) nebulizer solution 175 mcg  175 mcg Nebulization DAILY   ??? dextromethorphan (DELSYM) 30 mg/5 mL syrup 30 mg  30 mg Oral Q12H PRN   ???  dilTIAZem CD (CARDIZEM CD) capsule 180 mg  180 mg Oral DAILY   ??? gabapentin (NEURONTIN) capsule 300 mg  300 mg Oral TID   ??? losartan (COZAAR) tablet 12.5 mg  12.5 mg Oral DAILY   ??? montelukast (SINGULAIR) tablet 10 mg  10 mg Oral QHS   ??? naloxone (NARCAN) injection 0.1 mg  0.1 mg IntraVENous PRN   ??? acetaminophen (TYLENOL) tablet 650 mg  650 mg Oral Q4H PRN    Or   ??? acetaminophen (TYLENOL) solution 650 mg  650 mg Oral Q4H PRN    Or   ??? acetaminophen (TYLENOL) suppository 650 mg  650 mg Rectal Q4H PRN   ??? HYDROcodone-acetaminophen (NORCO) 5-325 mg per tablet 1 Tab  1 Tab Oral Q4H PRN   ??? ondansetron (ZOFRAN) injection 4 mg  4 mg IntraVENous Q4H PRN   ??? heparin (porcine) injection 5,000 Units  5,000 Units SubCUTAneous Q8H   ??? arformoteroL (BROVANA) neb solution 15 mcg  15 mcg Nebulization BID RT   ??? budesonide (PULMICORT) 500 mcg/2 ml nebulizer suspension  500 mcg Nebulization BID RT   ??? albuterol (PROVENTIL VENTOLIN) nebulizer solution 2.5 mg  2.5 mg Nebulization Q2H PRN   ??? dextrose (D50W) injection syrg 5-25 g  10-50 mL IntraVENous PRN   ??? glucagon (GLUCAGEN) injection 1 mg  1 mg IntraMUSCular PRN   ??? insulin glargine (LANTUS) injection 1-100 Units  1-100 Units SubCUTAneous QHS   ??? insulin lispro (HUMALOG) injection 1-100 Units  1-100 Units SubCUTAneous AC&HS   ??? insulin lispro (HUMALOG) injection 1-100 Units  1-100 Units SubCUTAneous PRN        Care Plan discussed with: pt     Total time spent with patient: 25 minutes.    Darrin Nipper, MD  August 28, 2018  1045

## 2018-08-28 NOTE — Progress Notes (Signed)
 Barrister's clerk Visit this date.  Prayer provided.

## 2018-08-29 ENCOUNTER — Inpatient Hospital Stay: Admit: 2018-08-29 | Payer: MEDICARE | Primary: Physician Assistant

## 2018-08-29 LAB — CBC WITH AUTO DIFFERENTIAL
Basophils %: 0.2 % (ref 0–3)
Eosinophils %: 0 % (ref 0–5)
Hematocrit: 42.8 % (ref 37.0–50.0)
Hemoglobin: 13.6 gm/dl (ref 13.0–17.2)
Immature Granulocytes: 1.1 % (ref 0.0–3.0)
Lymphocytes %: 5.7 % — ABNORMAL LOW (ref 28–48)
MCH: 28.7 pg (ref 25.4–34.6)
MCHC: 31.8 gm/dl (ref 30.0–36.0)
MCV: 90.3 fL (ref 80.0–98.0)
MPV: 11.5 fL — ABNORMAL HIGH (ref 6.0–10.0)
Monocytes %: 5.5 % (ref 1–13)
Neutrophils %: 87.5 % — ABNORMAL HIGH (ref 34–64)
Nucleated RBCs: 0 (ref 0–0)
Platelets: 266 10*3/uL (ref 140–450)
RBC: 4.74 M/uL (ref 3.60–5.20)
RDW-SD: 44.6 (ref 36.4–46.3)
WBC: 19.4 10*3/uL — ABNORMAL HIGH (ref 4.0–11.0)

## 2018-08-29 LAB — POCT GLUCOSE
POC Glucose: 299 mg/dL — ABNORMAL HIGH (ref 65–105)
POC Glucose: 238 mg/dL — ABNORMAL HIGH (ref 65–105)
POC Glucose: 424 mg/dL (ref 65–105)
POC Glucose: 107 mg/dL — ABNORMAL HIGH (ref 65–105)

## 2018-08-29 LAB — RENAL FUNCTION PANEL
Albumin: 3.2 gm/dl — ABNORMAL LOW (ref 3.4–5.0)
Albumin: 3.2 gm/dl — ABNORMAL LOW (ref 3.4–5.0)
Anion Gap: 7 mmol/L (ref 5–15)
Anion gap: 7 mmol/L (ref 5–15)
BUN: 46 mg/dl — ABNORMAL HIGH (ref 7–25)
BUN: 46 mg/dl — ABNORMAL HIGH (ref 7–25)
CO2: 30 mEq/L (ref 21–32)
CO2: 30 mEq/L (ref 21–32)
Calcium: 8.8 mg/dl (ref 8.5–10.1)
Calcium: 8.8 mg/dl (ref 8.5–10.1)
Chloride: 98 mEq/L (ref 98–107)
Chloride: 98 mEq/L (ref 98–107)
Creatinine: 1 mg/dl (ref 0.6–1.3)
Creatinine: 1 mg/dl (ref 0.6–1.3)
EGFR IF NonAfrican American: 59
GFR African American: >60
GFR est AA: >60
GFR est non-AA: 59
Glucose: 231 mg/dl — ABNORMAL HIGH (ref 74–106)
Glucose: 231 mg/dl — ABNORMAL HIGH (ref 74–106)
Phosphorus: 3.7 mg/dl (ref 2.5–4.9)
Phosphorus: 3.7 mg/dl (ref 2.5–4.9)
Potassium: 5.1 mEq/L (ref 3.5–5.1)
Potassium: 5.1 mEq/L (ref 3.5–5.1)
Sodium: 134 mEq/L — ABNORMAL LOW (ref 136–145)
Sodium: 134 mEq/L — ABNORMAL LOW (ref 136–145)

## 2018-08-29 LAB — MAGNESIUM
Magnesium: 2.4 mg/dl (ref 1.6–2.6)
Magnesium: 2.4 mg/dl (ref 1.6–2.6)

## 2018-08-29 LAB — CBC WITH AUTOMATED DIFF
BASOPHILS: 0.2 % (ref 0–3)
EOSINOPHILS: 0 % (ref 0–5)
HCT: 42.8 % (ref 37.0–50.0)
HGB: 13.6 gm/dl (ref 13.0–17.2)
IMMATURE GRANULOCYTES: 1.1 % (ref 0.0–3.0)
LYMPHOCYTES: 5.7 % — ABNORMAL LOW (ref 28–48)
MCH: 28.7 pg (ref 25.4–34.6)
MCHC: 31.8 gm/dl (ref 30.0–36.0)
MCV: 90.3 fL (ref 80.0–98.0)
MONOCYTES: 5.5 % (ref 1–13)
MPV: 11.5 fL — ABNORMAL HIGH (ref 6.0–10.0)
NEUTROPHILS: 87.5 % — ABNORMAL HIGH (ref 34–64)
NRBC: 0 (ref 0–0)
PLATELET: 266 10*3/uL (ref 140–450)
RBC: 4.74 M/uL (ref 3.60–5.20)
RDW-SD: 44.6 (ref 36.4–46.3)
WBC: 19.4 10*3/uL — ABNORMAL HIGH (ref 4.0–11.0)

## 2018-08-29 LAB — GLUCOSE, POC
Glucose (POC): 299 mg/dL — ABNORMAL HIGH (ref 65–105)
Glucose (POC): 238 mg/dL — ABNORMAL HIGH (ref 65–105)
Glucose (POC): 424 mg/dL — CR (ref 65–105)
Glucose (POC): 107 mg/dL — ABNORMAL HIGH (ref 65–105)

## 2018-08-29 MED ORDER — METHYLPREDNISOLONE (PF) 40 MG/ML IJ SOLR
40 mg/mL | Freq: Four times a day (QID) | INTRAMUSCULAR | Status: DC
Start: 2018-08-29 — End: 2018-09-01
  Administered 2018-08-30 – 2018-09-01 (×12): via INTRAVENOUS

## 2018-08-29 MED ORDER — DOCUSATE SODIUM 100 MG CAP
100 mg | Freq: Two times a day (BID) | ORAL | Status: DC
Start: 2018-08-29 — End: 2018-08-29

## 2018-08-29 MED ORDER — LOSARTAN 50 MG TAB
50 mg | Freq: Every day | ORAL | Status: DC
Start: 2018-08-29 — End: 2018-09-07
  Administered 2018-08-30 – 2018-09-06 (×8): via ORAL

## 2018-08-29 MED ORDER — POLYETHYLENE GLYCOL 3350 17 GRAM (100 %) ORAL POWDER PACKET
17 gram | Freq: Two times a day (BID) | ORAL | Status: DC | PRN
Start: 2018-08-29 — End: 2018-09-07

## 2018-08-29 MED ORDER — AZITHROMYCIN 250 MG TAB
250 mg | Freq: Every day | ORAL | Status: AC
Start: 2018-08-29 — End: 2018-09-02
  Administered 2018-08-29 – 2018-09-02 (×5): via ORAL

## 2018-08-29 MED ORDER — BISACODYL 10 MG RECTAL SUPPOSITORY
10 mg | Freq: Every day | RECTAL | Status: DC | PRN
Start: 2018-08-29 — End: 2018-09-07

## 2018-08-29 MED ORDER — DILTIAZEM ER 120 MG 24 HR CAP
120 mg | Freq: Every day | ORAL | Status: DC
Start: 2018-08-29 — End: 2018-09-07
  Administered 2018-08-30 – 2018-09-07 (×9): via ORAL

## 2018-08-29 MED ORDER — METHYLPREDNISOLONE (PF) 40 MG/ML IJ SOLR
40 mg/mL | Freq: Four times a day (QID) | INTRAMUSCULAR | Status: DC
Start: 2018-08-29 — End: 2018-08-29
  Administered 2018-08-29: 18:00:00 via INTRAVENOUS

## 2018-08-29 MED FILL — GABAPENTIN 300 MG CAP: 300 mg | ORAL | Qty: 1

## 2018-08-29 MED FILL — BUDESONIDE 0.5 MG/2 ML NEB SUSPENSION: 0.5 mg/2 mL | RESPIRATORY_TRACT | Qty: 1

## 2018-08-29 MED FILL — MUCINEX 600 MG TABLET, EXTENDED RELEASE: 600 mg | ORAL | Qty: 2

## 2018-08-29 MED FILL — IPRATROPIUM-ALBUTEROL 2.5 MG-0.5 MG/3 ML NEB SOLUTION: 2.5 mg-0.5 mg/3 ml | RESPIRATORY_TRACT | Qty: 3

## 2018-08-29 MED FILL — AZITHROMYCIN 250 MG TAB: 250 mg | ORAL | Qty: 2

## 2018-08-29 MED FILL — HYDROMORPHONE 1 MG/ML INJECTION SOLUTION: 1 mg/mL | INTRAMUSCULAR | Qty: 1

## 2018-08-29 MED FILL — SOLU-MEDROL (PF) 40 MG/ML SOLUTION FOR INJECTION: 40 mg/mL | INTRAMUSCULAR | Qty: 1

## 2018-08-29 MED FILL — ACETYLCYSTEINE 10 % (100 MG/ML) SOLN: 100 mg/mL (10 %) | Qty: 4

## 2018-08-29 MED FILL — DOCUSATE SODIUM 100 MG CAP: 100 mg | ORAL | Qty: 1

## 2018-08-29 MED FILL — YUPELRI 175 MCG/3 ML SOLUTION FOR NEBULIZATION: 175 mcg/3 mL | RESPIRATORY_TRACT | Qty: 1

## 2018-08-29 MED FILL — INSULIN GLARGINE 100 UNIT/ML INJECTION: 100 unit/mL | SUBCUTANEOUS | Qty: 19

## 2018-08-29 MED FILL — INSULIN LISPRO 100 UNIT/ML INJECTION: 100 unit/mL | SUBCUTANEOUS | Qty: 5

## 2018-08-29 MED FILL — BROVANA 15 MCG/2 ML SOLUTION FOR NEBULIZATION: 15 mcg/2 mL | RESPIRATORY_TRACT | Qty: 1

## 2018-08-29 MED FILL — CLONIDINE 0.1 MG TAB: 0.1 mg | ORAL | Qty: 3

## 2018-08-29 MED FILL — DILTIAZEM ER 180 MG 24 HR CAP: 180 mg | ORAL | Qty: 1

## 2018-08-29 MED FILL — SOLU-MEDROL (PF) 40 MG/ML SOLUTION FOR INJECTION: 40 mg/mL | INTRAMUSCULAR | Qty: 2

## 2018-08-29 MED FILL — LOSARTAN 25 MG TAB: 25 mg | ORAL | Qty: 1

## 2018-08-29 MED FILL — CETIRIZINE 5 MG TAB: 5 mg | ORAL | Qty: 2

## 2018-08-29 NOTE — Progress Notes (Signed)
Pt called RN for headache 7/10. Pt was offered Tylenol and Norco, but refused and started to be anxious and said she needs Dilaudid IV instead.

## 2018-08-29 NOTE — Progress Notes (Signed)
Hospitalist Progress Note         [facility]    Daily Progress Note: 08/29/2018    Assessment/Plan:   1.  Acute hypoxic respiratory failure: she had an RRT called on her this morning because she went into acute hypoxic respiratory failure tachycardia and tachypnea.  She appeared to be quite tight with only minimal wheezing at the bases but she appeared to be quite anxious.  -We will need to transfer her to stepdown at this time I will increase her Solu-Medrol to-25 mg IV x1 and restart Solu-Medrol 60 mg IV every 6 hours I have increased her DuoNeb to every 4 hours and make albuterol nebulizer every 2 hours and started Mucomyst 40 mg every 6 hours and Mucinex 1200 mg twice daily as the patient does have a seasonal allergy component.  -Started Ativan 0.5 mg IV every 6 hours as needed for anxiety as the patient does have PTSD.  -We will check a chest x-ray.  -I discussed with nursing staff respiratory therapy and stepdown the patient will need to be transferred to stepdown at this time.  -We will check an ABG given her acute hypoxic change but the patient is alert and oriented x3 and mentating very well.  - will check a chest xray to make certain there isnt an infiltrate contributing to her distress.  - on 3/18; left-sided chest tube placed on 317 after chest x-ray was obtained.              -Appreciate Dr. Assunta Foundarters assistance.              -Chest tube has been minimal bloody drainage at this time. -40 cm of suction.  Chest tube without air leak.            -Chest x-ray does show improvement but still small apical pneumothorax.          2.  Acute exacerbation COPD. Called earlier by RT who noted Pt had been changed to Prednisone 40mg  every day, resumed Solumedrol 60mg  q 8h.     -Minimal expiratory wheezing we will continue Solu-Medrol IV every 8 hours at this time .    3.  Acute bronchitis  4.  HTN, uncontrolled: Due to acute dyspnea.       -We will increase losartan to 50 mg and increase diltiazem to 240 mg  from         180 mg.  5.  T2DM  6.  Left upper lobe lung nodule, will need continued follow-up   7.  Dyslipidemia  8.  PTSD, she was [place], has had respiratory problems since.  RN notes anxiety @ times. Try prn Hydroxyzine  9.  Peripheral neuropathy  10.  GERD.    11.  DJD  12.  DVT proph w sc Heparin  13.  Dispo: Still probably needs a couple of days  Subjective:   Interval history:    3/18; she appears to be more comfortable today but obviously and still moderate amount of pain to the chest tube which is to be expected.  Overall respiratory status is improved.  She states that her anxiety is controlled at this time.  She appears quite optimistic as she should be.      3/17: Rapid response team was called because she patient went into acute hypoxic respiratory failure and began to have tach tachycardia and tachypnea the patient had to be placed back on BiPAP at this time.  An  ABG chest x-ray will be obtained we will have to escalate her nebulizer treatments.  We will need to move her to the stepdown at this time.  Respiratory is on board.  She does appear to be quite anxious and therefore gave Ativan 0.5 mg IV x1 and will make Ativan 0.5 mg IV every 6 hours as needed as patient does have a history of PTSD she was exposed to respiratory problems since we will treat started on 11.      Cardiovascular ROS: no chest pain ,  orthopnea  Gastrointestinal ROS: no abdominal pain, nausea,vomiting, diarrhea, melena or hematechezia  Genito-Urinary ROS: no dysuria, trouble voiding, or hematuria  Objective:   Physical Exam:     Visit Vitals  BP (!) 166/104 (BP 1 Location: Left arm, BP Patient Position: Supine)   Pulse 75   Temp 97.7 ??F (36.5 ??C)   Resp 18   Ht  (1.727 m)   Wt 91.6 kg (202 lb)   SpO2 95%   BMI 30.71 kg/m??    O2 Flow Rate (L/min): 3 l/min O2 Device: Nasal cannula    Temp (24hrs), Avg:98.1 ??F (36.7 ??C), Min:97.3 ??F (36.3 ??C), Max:98.7 ??F (37.1 ??C)     No intake/output data recorded.   03/16 1901 - 03/18 0700  In: 2040 [P.O.:2040]  Out: 2310 [Urine:2300]    General:  Alert, cooperative, no distress, appears stated age.   Lungs:    Still significant exp wheezes    :     Heart:  Regular rate and rhythm, S1, S2 normal, no murmur, click, rub or gallop.   Abdomen:   Soft, non-tender. Bowel sounds normal. No masses,  No organomegaly.   Extremities:  FROM, no LE edema   Pulses: 2+ and symmetric all extremities.   Skin: Skin color, texture, turgor normal. No rashes or lesions   :      Data Review:       24 Hour Results:  Recent Results (from the past 24 hour(s))   POC BLOOD GAS + LACTIC ACID    Collection Time: 08/28/18 11:02 AM   Result Value Ref Range    pH 7.388 7.350 - 7.450      PCO2 43.7 35.0 - 45.0 mm Hg    PO2 76.0 75 - 100 mm Hg    BICARBONATE 26.5 (H) 18.0 - 26.0 mmol/L    O2 SAT 95.0 90 - 100 %    CO2, TOTAL 28.0 24 - 29 mmol/L    BASE EXCESS 1 -2 - 3 mmol/L    Patient temp. 97.3 F      Sample type Art      FIO2 30      SITE L Radial      DEVICE BiPAP      ALLENS TEST Pass      IPAP/PIP 18      EPAP/CPAP/PEEP 8      Respiratory Rate 25      SET RATE 15      VTEXP 597     GLUCOSE, POC    Collection Time: 08/28/18 11:56 AM   Result Value Ref Range    Glucose (POC) 286 (H) 65 - 105 mg/dL   PROTHROMBIN TIME + INR    Collection Time: 08/28/18  1:20 PM   Result Value Ref Range    Prothrombin time 11.7 10.2 - 12.9 seconds    INR 1.0 0.1 - 1.1     GLUCOSE, POC    Collection Time:  08/28/18  4:54 PM   Result Value Ref Range    Glucose (POC) 325 (H) 65 - 105 mg/dL   GLUCOSE, POC    Collection Time: 08/28/18 10:06 PM   Result Value Ref Range    Glucose (POC) 299 (H) 65 - 105 mg/dL   CBC WITH AUTOMATED DIFF    Collection Time: 08/29/18  6:23 AM   Result Value Ref Range    WBC 19.4 (H) 4.0 - 11.0 1000/mm3    RBC 4.74 3.60 - 5.20 M/uL    HGB 13.6 13.0 - 17.2 gm/dl    HCT 86.7 67.2 - 09.4 %    MCV 90.3 80.0 - 98.0 fL    MCH 28.7 25.4 - 34.6 pg     MCHC 31.8 30.0 - 36.0 gm/dl    PLATELET 709 628 - 366 1000/mm3    MPV 11.5 (H) 6.0 - 10.0 fL    RDW-SD 44.6 36.4 - 46.3      NRBC 0 0 - 0      IMMATURE GRANULOCYTES 1.1 0.0 - 3.0 %    NEUTROPHILS 87.5 (H) 34 - 64 %    LYMPHOCYTES 5.7 (L) 28 - 48 %    MONOCYTES 5.5 1 - 13 %    EOSINOPHILS 0.0 0 - 5 %    BASOPHILS 0.2 0 - 3 %   RENAL FUNCTION PANEL    Collection Time: 08/29/18  6:23 AM   Result Value Ref Range    Sodium 134 (L) 136 - 145 mEq/L    Potassium 5.1 3.5 - 5.1 mEq/L    Chloride 98 98 - 107 mEq/L    CO2 30 21 - 32 mEq/L    Glucose 231 (H) 74 - 106 mg/dl    BUN 46 (H) 7 - 25 mg/dl    Creatinine 1.0 0.6 - 1.3 mg/dl    GFR est AA >29.4      GFR est non-AA 59      Calcium 8.8 8.5 - 10.1 mg/dl    Albumin 3.2 (L) 3.4 - 5.0 gm/dl    Phosphorus 3.7 2.5 - 4.9 mg/dl    Anion gap 7 5 - 15 mmol/L   MAGNESIUM    Collection Time: 08/29/18  6:23 AM   Result Value Ref Range    Magnesium 2.4 1.6 - 2.6 mg/dl   GLUCOSE, POC    Collection Time: 08/29/18  8:01 AM   Result Value Ref Range    Glucose (POC) 238 (H) 65 - 105 mg/dL       Problem List:  Problem List as of 08/29/2018 Date Reviewed: 08/15/2018          Codes Class Noted - Resolved    Respiratory failure with hypoxia [place]) ICD-10-CM: J96.91  ICD-9-CM: 518.81  08/22/2018 - Present        Headache ICD-10-CM: R51  ICD-9-CM: 784.0  08/05/2018 - Present        Abdominal pain ICD-10-CM: R10.9  ICD-9-CM: 789.00  02/04/2018 - Present        SIRS (systemic inflammatory response syndrome) (HCC) ICD-10-CM: R65.10  ICD-9-CM: 995.90  10/09/2017 - Present        Hyperkalemia ICD-10-CM: E87.5  ICD-9-CM: 276.7  09/08/2017 - Present        Obtunded ICD-10-CM: R40.1  ICD-9-CM: 780.09  09/08/2017 - Present        Acute renal failure (ARF) (HCC) ICD-10-CM: N17.9  ICD-9-CM: 584.9  09/08/2017 - Present        Septic shock (  HCC) ICD-10-CM: A41.9, R65.21  ICD-9-CM: 038.9, 785.52, 995.92  09/08/2017 - Present        Metabolic encephalopathy ICD-10-CM: G93.41  ICD-9-CM: 348.31  09/08/2017 - Present         Dehydration ICD-10-CM: E86.0  ICD-9-CM: 276.51  08/07/2017 - Present        COPD with acute exacerbation (HCC) ICD-10-CM: J44.1  ICD-9-CM: 491.21  08/07/2017 - Present        Acute-on-chronic kidney injury (HCC) ICD-10-CM: N17.9, N18.9  ICD-9-CM: 584.9, 585.9  08/07/2017 - Present        Chest pain ICD-10-CM: R07.9  ICD-9-CM: 786.50  06/15/2017 - Present        Dyspnea ICD-10-CM: R06.00  ICD-9-CM: 786.09  06/15/2017 - Present        Type 2 diabetes mellitus with diabetic neuropathy (HCC) ICD-10-CM: E11.40  ICD-9-CM: 250.60, 357.2  12/07/2016 - Present        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9  ICD-9-CM: 038.9, 995.91  07/24/2016 - Present        Asthma exacerbation ICD-10-CM: J45.901  ICD-9-CM: 493.92  07/24/2016 - Present        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: E11.21  ICD-9-CM: 250.40, 583.81  06/12/2016 - Present        Sacroiliitis (HCC) ICD-10-CM: M46.1  ICD-9-CM: 720.2  11/25/2015 - Present        Essential hypertension ICD-10-CM: I10  ICD-9-CM: 401.9  10/06/2015 - Present        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  ICD-9-CM: 721.3  09/15/2015 - Present        Lumbar and sacral osteoarthritis ICD-10-CM: M47.817  ICD-9-CM: 721.3  09/15/2015 - Present        Chronic pain syndrome ICD-10-CM: G89.4  ICD-9-CM: 338.4  09/15/2015 - Present         Type 2 diabetes mellitus with hyperglycemia, without long-term current use of insulin (HCC) ICD-10-CM: E11.65  ICD-9-CM: 250.00, 790.29  08/20/2015 - Present        Acute colitis ICD-10-CM: K52.9  ICD-9-CM: 558.9  07/02/2015 - Present        Acute hyperglycemia ICD-10-CM: R73.9  ICD-9-CM: 790.29  07/02/2015 - Present        Accelerated hypertension ICD-10-CM: I10  ICD-9-CM: 401.0  04/08/2015 - Present        Severe headache ICD-10-CM: R51  ICD-9-CM: 784.0  04/07/2015 - Present        Osteoarthritis of hips, 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  ICD-9-CM: 715.95  01/29/2015 - Present        PTSD (post-traumatic stress disorder) (Chronic) ICD-10-CM: F43.10  ICD-9-CM: 309.81  01/29/2015 - Present        Severe hypertension (Chronic) ICD-10-CM: I10  ICD-9-CM: 401.9  07/21/2014 - Present        RESOLVED: COPD exacerbation (HCC) ICD-10-CM: J44.1  ICD-9-CM: 491.21  11/14/2017 - 08/08/2018        RESOLVED: New onset type 1 diabetes mellitus, uncontrolled (HCC) ICD-10-CM: E10.65  ICD-9-CM: 250.03  07/02/2015 - 10/06/2015        RESOLVED: Recurrent major depressive disorder, in full remission (HCC) ICD-10-CM: F33.42  ICD-9-CM: 296.36  12/02/2014 - 12/02/2014        RESOLVED: Foreign body in colon ICD-10-CM: T18.4XXA  ICD-9-CM: 936  08/14/2014 - 01/29/2015        RESOLVED: Advanced care planning/counseling discussion ICD-10-CM: Z71.89  ICD-9-CM: V65.49  07/21/2014 - 01/29/2015    Overview Signed 07/21/2014  3:46 PM by Littie Deeds     Patient given [place] application               RESOLVED: Hip pain, chronic ICD-10-CM: M25.559, G89.29  ICD-9-CM: 719.45, 338.29  07/21/2014 - 01/29/2015               Medications reviewed  Current Facility-Administered Medications   Medication Dose Route Frequency   ??? [START ON 08/30/2018] dilTIAZem CD (CARDIZEM CD) capsule 240 mg  240 mg Oral DAILY   ??? [START ON 08/30/2018] losartan (COZAAR) tablet 50 mg  50 mg Oral DAILY   ??? albuterol-ipratropium (DUO-NEB) 2.5 MG-0.5 MG/3 ML  3 mL Nebulization Q4H RT    ??? albuterol (PROVENTIL VENTOLIN) nebulizer solution 2.5 mg  2.5 mg Nebulization Q2H PRN   ??? methylPREDNISolone (PF) (SOLU-MEDROL) injection 60 mg  60 mg IntraVENous Q6H   ??? guaiFENesin ER (MUCINEX) tablet 1,200 mg  1,200 mg Oral Q12H   ??? LORazepam (ATIVAN) injection 0.5 mg  0.5 mg IntraVENous Q6H PRN   ??? cetirizine (ZYRTEC) tablet 10 mg  10 mg Oral DAILY   ??? fluticasone propionate (FLONASE) 50 mcg/actuation nasal spray 2 Spray  2 Spray Both Nostrils BID   ??? acetylcysteine (MUCOMYST) 100 mg/mL (10 %) nebulizer solution 400 mg  4 mL Nebulization Q8H RT   ??? HYDROcodone-acetaminophen (NORCO) 5-325 mg per tablet 1-2 Tab  1-2 Tab Oral Q4H PRN   ??? HYDROmorphone (DILAUDID) injection 0.5-1 mg  0.5-1 mg IntraVENous Q3H PRN   ??? docusate sodium (COLACE) capsule 100 mg  100 mg Oral BID   ??? hydrOXYzine HCL (ATARAX) tablet 25 mg  25 mg Oral TID PRN   ??? cloNIDine HCL (CATAPRES) tablet 0.3 mg  0.3 mg Oral BID   ??? ketorolac (TORADOL) injection 15 mg  15 mg IntraVENous Q6H PRN   ??? QUEtiapine SR (SEROquel XR) tablet 150 mg  150 mg Oral QHS   ??? revefenacin (YUPELRI) nebulizer solution 175 mcg  175 mcg Nebulization DAILY   ??? dextromethorphan (DELSYM) 30 mg/5 mL syrup 30 mg  30 mg Oral Q12H PRN   ??? gabapentin (NEURONTIN) capsule 300 mg  300 mg Oral TID   ??? montelukast (SINGULAIR) tablet 10 mg  10 mg Oral QHS   ??? naloxone (NARCAN) injection 0.1 mg  0.1 mg IntraVENous PRN   ??? acetaminophen (TYLENOL) tablet 650 mg  650 mg Oral Q4H PRN    Or   ??? acetaminophen (TYLENOL) solution 650 mg  650 mg Oral Q4H PRN    Or   ??? acetaminophen (TYLENOL) suppository 650 mg  650 mg Rectal Q4H PRN   ??? HYDROcodone-acetaminophen (NORCO) 5-325 mg per tablet 1 Tab  1 Tab Oral Q4H PRN   ??? ondansetron (ZOFRAN) injection  4 mg  4 mg IntraVENous Q4H PRN   ??? [Held by provider] heparin (porcine) injection 5,000 Units  5,000 Units SubCUTAneous Q8H   ??? arformoteroL (BROVANA) neb solution 15 mcg  15 mcg Nebulization BID RT    ??? budesonide (PULMICORT) 500 mcg/2 ml nebulizer suspension  500 mcg Nebulization BID RT   ??? albuterol (PROVENTIL VENTOLIN) nebulizer solution 2.5 mg  2.5 mg Nebulization Q2H PRN   ??? dextrose (D50W) injection syrg 5-25 g  10-50 mL IntraVENous PRN   ??? glucagon (GLUCAGEN) injection 1 mg  1 mg IntraMUSCular PRN   ??? insulin glargine (LANTUS) injection 1-100 Units  1-100 Units SubCUTAneous QHS   ??? insulin lispro (HUMALOG) injection 1-100 Units  1-100 Units SubCUTAneous AC&HS   ??? insulin lispro (HUMALOG) injection 1-100 Units  1-100 Units SubCUTAneous PRN        Care Plan discussed with: pt     Total time spent with patient: 25 minutes.    Darrin Nipper, MD  August 29, 2018  1045

## 2018-08-29 NOTE — Progress Notes (Addendum)
Started patient on PEP/ Flutter valve therapy, to assist with mucus production and enhance lung expansion    Problem: Gas Exchange - Impaired  Goal: *Absence of hypoxia  Outcome: Progressing Towards Goal     Problem: Patient Education: Go to Patient Education Activity  Goal: Patient/Family Education  Outcome: Progressing Towards Goal

## 2018-08-29 NOTE — Other (Signed)
Bedside shift change report given to Cyndy Clare F Domingo, RN   (oncoming nurse) by Paz RN (offgoing nurse). Report included the following information SBAR.

## 2018-08-29 NOTE — Progress Notes (Signed)
Overall feeling better today.  On nasal cannula.  She is wheezing.  Chest tube without air leak.  To my eyes the chest x-ray looks like there is still a small apical pneumothorax however it appears better than yesterday.  It is difficult to differentiate pneumothorax from bleb disease.  Continue chest tube to -40 cm of suction.

## 2018-08-29 NOTE — Other (Signed)
Bedside shift change report given to [facility]   (oncoming nurse) by Eddie Candle  RN (offgoing nurse). Report included the following information SBAR.

## 2018-08-29 NOTE — Progress Notes (Signed)
Hospitalist Progress Note         [facility]    Daily Progress Note: 08/29/2018    Assessment/Plan:   1.  Acute hypoxic respiratory failure: she had an RRT called on her this morning because she went into acute hypoxic respiratory failure tachycardia and tachypnea.  She appeared to be quite tight with only minimal wheezing at the bases but she appeared to be quite anxious.  -We will need to transfer her to stepdown at this time I will increase her Solu-Medrol to-25 mg IV x1 and restart Solu-Medrol 60 mg IV every 6 hours I have increased her DuoNeb to every 4 hours and make albuterol nebulizer every 2 hours and started Mucomyst 40 mg every 6 hours and Mucinex 1200 mg twice daily as the patient does have a seasonal allergy component.  -Started Ativan 0.5 mg IV every 6 hours as needed for anxiety as the patient does have PTSD.  -We will check a chest x-ray.  -I discussed with nursing staff respiratory therapy and stepdown the patient will need to be transferred to stepdown at this time.  -We will check an ABG given her acute hypoxic change but the patient is alert and oriented x3 and mentating very well.  - will check a chest xray to make certain there isnt an infiltrate contributing to her distress.  - on 3/18; left-sided chest tube placed on 317 after chest x-ray was obtained.              -Appreciate Dr. Assunta Foundarters assistance.              -Chest tube has been minimal bloody drainage at this time. -40 cm of suction.  Chest tube without air leak.            -Chest x-ray does show improvement but still small apical pneumothorax.          2.  Acute exacerbation COPD. Called earlier by RT who noted Pt had been changed to Prednisone 40mg  every day, resumed Solumedrol 60mg  q 8h.     -Minimal expiratory wheezing we will continue Solu-Medrol IV every 8 hours at this time .    3.  Acute bronchitis  4.  HTN, uncontrolled: Due to acute dyspnea.       -We will increase losartan to 50 mg and increase diltiazem to 240 mg  from         180 mg.  5.  T2DM  6.  Left upper lobe lung nodule, will need continued follow-up   7.  Dyslipidemia  8.  PTSD, she was in [place] on [place], has had respiratory problems since.  RN notes anxiety @ times. Try prn Hydroxyzine  9.  Peripheral neuropathy  10.  GERD.    11.  DJD  12.  DVT proph w sc Heparin  13.  Dispo: Still probably needs a couple of days  Subjective:   Interval history:    3/18; she appears to be more comfortable today but obviously and still moderate amount of pain to the chest tube which is to be expected.  Overall respiratory status is improved.  She states that her anxiety is controlled at this time.  She appears quite optimistic as she should be.      3/17: Rapid response team was called because she patient went into acute hypoxic respiratory failure and began to have tach tachycardia and tachypnea the patient had to be placed back on BiPAP at this time.  An  ABG chest x-ray will be obtained we will have to escalate her nebulizer treatments.  We will need to move her to the stepdown at this time.  Respiratory is on board.  She does appear to be quite anxious and therefore gave Ativan 0.5 mg IV x1 and will make Ativan 0.5 mg IV every 6 hours as needed as patient does have a history of PTSD she was exposed to respiratory problems since we will treat started on 11.      Cardiovascular ROS: no chest pain ,  orthopnea  Gastrointestinal ROS: no abdominal pain, nausea,vomiting, diarrhea, melena or hematechezia  Genito-Urinary ROS: no dysuria, trouble voiding, or hematuria  Objective:   Physical Exam:     Visit Vitals  BP (!) 166/104 (BP 1 Location: Left arm, BP Patient Position: Supine)   Pulse 75   Temp 97.7 ??F (36.5 ??C)   Resp 18   Ht  (1.727 m)   Wt 91.6 kg (202 lb)   SpO2 95%   BMI 30.71 kg/m??    O2 Flow Rate (L/min): 3 l/min O2 Device: Nasal cannula    Temp (24hrs), Avg:98.1 ??F (36.7 ??C), Min:97.3 ??F (36.3 ??C), Max:98.7 ??F (37.1 ??C)    No intake/output data recorded.    03/16 1901 - 03/18 0700  In: 2040 [P.O.:2040]  Out: 2310 [Urine:2300]    General:  Alert, cooperative, no distress, appears stated age.   Lungs:    Still significant exp wheezes    :     Heart:  Regular rate and rhythm, S1, S2 normal, no murmur, click, rub or gallop.   Abdomen:   Soft, non-tender. Bowel sounds normal. No masses,  No organomegaly.   Extremities:  FROM, no LE edema   Pulses: 2+ and symmetric all extremities.   Skin: Skin color, texture, turgor normal. No rashes or lesions   :      Data Review:       24 Hour Results:  Recent Results (from the past 24 hour(s))   POC BLOOD GAS + LACTIC ACID    Collection Time: 08/28/18 11:02 AM   Result Value Ref Range    pH 7.388 7.350 - 7.450      PCO2 43.7 35.0 - 45.0 mm Hg    PO2 76.0 75 - 100 mm Hg    BICARBONATE 26.5 (H) 18.0 - 26.0 mmol/L    O2 SAT 95.0 90 - 100 %    CO2, TOTAL 28.0 24 - 29 mmol/L    BASE EXCESS 1 -2 - 3 mmol/L    Patient temp. 97.3 F      Sample type Art      FIO2 30      SITE L Radial      DEVICE BiPAP      ALLENS TEST Pass      IPAP/PIP 18      EPAP/CPAP/PEEP 8      Respiratory Rate 25      SET RATE 15      VTEXP 597     GLUCOSE, POC    Collection Time: 08/28/18 11:56 AM   Result Value Ref Range    Glucose (POC) 286 (H) 65 - 105 mg/dL   PROTHROMBIN TIME + INR    Collection Time: 08/28/18  1:20 PM   Result Value Ref Range    Prothrombin time 11.7 10.2 - 12.9 seconds    INR 1.0 0.1 - 1.1     GLUCOSE, POC    Collection Time:  08/28/18  4:54 PM   Result Value Ref Range    Glucose (POC) 325 (H) 65 - 105 mg/dL   GLUCOSE, POC    Collection Time: 08/28/18 10:06 PM   Result Value Ref Range    Glucose (POC) 299 (H) 65 - 105 mg/dL   CBC WITH AUTOMATED DIFF    Collection Time: 08/29/18  6:23 AM   Result Value Ref Range    WBC 19.4 (H) 4.0 - 11.0 1000/mm3    RBC 4.74 3.60 - 5.20 M/uL    HGB 13.6 13.0 - 17.2 gm/dl    HCT 16.1 09.6 - 04.5 %    MCV 90.3 80.0 - 98.0 fL    MCH 28.7 25.4 - 34.6 pg    MCHC 31.8 30.0 - 36.0 gm/dl    PLATELET 409 811 - 914 1000/mm3     MPV 11.5 (H) 6.0 - 10.0 fL    RDW-SD 44.6 36.4 - 46.3      NRBC 0 0 - 0      IMMATURE GRANULOCYTES 1.1 0.0 - 3.0 %    NEUTROPHILS 87.5 (H) 34 - 64 %    LYMPHOCYTES 5.7 (L) 28 - 48 %    MONOCYTES 5.5 1 - 13 %    EOSINOPHILS 0.0 0 - 5 %    BASOPHILS 0.2 0 - 3 %   RENAL FUNCTION PANEL    Collection Time: 08/29/18  6:23 AM   Result Value Ref Range    Sodium 134 (L) 136 - 145 mEq/L    Potassium 5.1 3.5 - 5.1 mEq/L    Chloride 98 98 - 107 mEq/L    CO2 30 21 - 32 mEq/L    Glucose 231 (H) 74 - 106 mg/dl    BUN 46 (H) 7 - 25 mg/dl    Creatinine 1.0 0.6 - 1.3 mg/dl    GFR est AA >78.2      GFR est non-AA 59      Calcium 8.8 8.5 - 10.1 mg/dl    Albumin 3.2 (L) 3.4 - 5.0 gm/dl    Phosphorus 3.7 2.5 - 4.9 mg/dl    Anion gap 7 5 - 15 mmol/L   MAGNESIUM    Collection Time: 08/29/18  6:23 AM   Result Value Ref Range    Magnesium 2.4 1.6 - 2.6 mg/dl   GLUCOSE, POC    Collection Time: 08/29/18  8:01 AM   Result Value Ref Range    Glucose (POC) 238 (H) 65 - 105 mg/dL       Problem List:  Problem List as of 08/29/2018 Date Reviewed: 08-13-2018          Codes Class Noted - Resolved    Respiratory failure with hypoxia [facility]) ICD-10-CM: J96.91  ICD-9-CM: 518.81  08/22/2018 - Present        Headache ICD-10-CM: R51  ICD-9-CM: 784.0  08/05/2018 - Present        Abdominal pain ICD-10-CM: R10.9  ICD-9-CM: 789.00  02/04/2018 - Present        SIRS (systemic inflammatory response syndrome) (HCC) ICD-10-CM: R65.10  ICD-9-CM: 995.90  10/09/2017 - Present        Hyperkalemia ICD-10-CM: E87.5  ICD-9-CM: 276.7  09/08/2017 - Present        Obtunded ICD-10-CM: R40.1  ICD-9-CM: 780.09  09/08/2017 - Present        Acute renal failure (ARF) (HCC) ICD-10-CM: N17.9  ICD-9-CM: 584.9  09/08/2017 - Present        Septic shock (  HCC) ICD-10-CM: A41.9, R65.21  ICD-9-CM: 038.9, 785.52, 995.92  09/08/2017 - Present        Metabolic encephalopathy ICD-10-CM: G93.41  ICD-9-CM: 348.31  09/08/2017 - Present        Dehydration ICD-10-CM: E86.0  ICD-9-CM: 276.51  08/07/2017 -  Present        COPD with acute exacerbation (HCC) ICD-10-CM: J44.1  ICD-9-CM: 491.21  08/07/2017 - Present        Acute-on-chronic kidney injury (HCC) ICD-10-CM: N17.9, N18.9  ICD-9-CM: 584.9, 585.9  08/07/2017 - Present        Chest pain ICD-10-CM: R07.9  ICD-9-CM: 786.50  06/15/2017 - Present        Dyspnea ICD-10-CM: R06.00  ICD-9-CM: 786.09  06/15/2017 - Present        Type 2 diabetes mellitus with diabetic neuropathy (HCC) ICD-10-CM: E11.40  ICD-9-CM: 250.60, 357.2  12/07/2016 - Present        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9  ICD-9-CM: 038.9, 995.91  07/24/2016 - Present        Asthma exacerbation ICD-10-CM: J45.901  ICD-9-CM: 493.92  07/24/2016 - Present        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: E11.21  ICD-9-CM: 250.40, 583.81  06/12/2016 - Present        Sacroiliitis (HCC) ICD-10-CM: M46.1  ICD-9-CM: 720.2  11/25/2015 - Present        Essential hypertension ICD-10-CM: I10  ICD-9-CM: 401.9  10/06/2015 - Present        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  ICD-9-CM: 721.3  09/15/2015 - Present        Lumbar and sacral osteoarthritis ICD-10-CM: M47.817  ICD-9-CM: 721.3  09/15/2015 - Present        Chronic pain syndrome ICD-10-CM: G89.4  ICD-9-CM: 338.4  09/15/2015 - Present        Type 2 diabetes mellitus with hyperglycemia, without long-term current use of insulin  (HCC) ICD-10-CM: E11.65  ICD-9-CM: 250.00, 790.29  08/20/2015 - Present        Acute colitis ICD-10-CM: K52.9  ICD-9-CM: 558.9  07/02/2015 - Present        Acute hyperglycemia ICD-10-CM: R73.9  ICD-9-CM: 790.29  07/02/2015 - Present        Accelerated hypertension ICD-10-CM: I10  ICD-9-CM: 401.0  04/08/2015 - Present        Severe headache ICD-10-CM: R51  ICD-9-CM: 784.0  04/07/2015 - Present        Osteoarthritis of hips, 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  ICD-9-CM: 715.95  01/29/2015 - Present        PTSD (post-traumatic stress disorder) (Chronic) ICD-10-CM: F43.10  ICD-9-CM: 309.81  01/29/2015 - Present        Severe hypertension (Chronic) ICD-10-CM: I10  ICD-9-CM: 401.9  07/21/2014 - Present        RESOLVED: COPD exacerbation (HCC) ICD-10-CM: J44.1  ICD-9-CM: 491.21  11/14/2017 - 08/08/2018        RESOLVED: New onset type 1 diabetes mellitus, uncontrolled (HCC) ICD-10-CM: E10.65  ICD-9-CM: 250.03  07/02/2015 - 10/06/2015        RESOLVED: Recurrent major depressive disorder, in full remission (HCC) ICD-10-CM: F33.42  ICD-9-CM: 296.36  12/02/2014 - 12/02/2014        RESOLVED: Foreign body in colon ICD-10-CM: T18.4XXA  ICD-9-CM: 936  08/14/2014 - 01/29/2015        RESOLVED: Advanced care planning/counseling discussion ICD-10-CM: Z71.89  ICD-9-CM: V65.49  07/21/2014 - 01/29/2015    Overview Signed 07/21/2014  3:46 PM by Littie Deeds     Patient given [place] application               RESOLVED: Hip pain, chronic ICD-10-CM: M25.559, G89.29  ICD-9-CM: 719.45, 338.29  07/21/2014 - 01/29/2015               Medications reviewed  Current Facility-Administered Medications   Medication Dose Route Frequency   ??? [START ON 08/30/2018] dilTIAZem CD (CARDIZEM CD) capsule 240 mg  240 mg Oral DAILY   ??? [START ON 08/30/2018] losartan (COZAAR) tablet 50 mg  50 mg Oral DAILY   ??? albuterol-ipratropium (DUO-NEB) 2.5 MG-0.5 MG/3 ML  3 mL Nebulization Q4H RT   ??? albuterol (PROVENTIL VENTOLIN) nebulizer solution 2.5 mg  2.5 mg Nebulization Q2H PRN   ???  methylPREDNISolone (PF) (SOLU-MEDROL) injection 60 mg  60 mg IntraVENous Q6H   ??? guaiFENesin ER (MUCINEX) tablet 1,200 mg  1,200 mg Oral Q12H   ??? LORazepam (ATIVAN) injection 0.5 mg  0.5 mg IntraVENous Q6H PRN   ??? cetirizine (ZYRTEC) tablet 10 mg  10 mg Oral DAILY   ??? fluticasone propionate (FLONASE) 50 mcg/actuation nasal spray 2 Spray  2 Spray Both Nostrils BID   ??? acetylcysteine (MUCOMYST) 100 mg/mL (10 %) nebulizer solution 400 mg  4 mL Nebulization Q8H RT   ??? HYDROcodone-acetaminophen (NORCO) 5-325 mg per tablet 1-2 Tab  1-2 Tab Oral Q4H PRN   ??? HYDROmorphone (DILAUDID) injection 0.5-1 mg  0.5-1 mg IntraVENous Q3H PRN   ??? docusate sodium (COLACE) capsule 100 mg  100 mg Oral BID   ??? hydrOXYzine HCL (ATARAX) tablet 25 mg  25 mg Oral TID PRN   ??? cloNIDine HCL (CATAPRES) tablet 0.3 mg  0.3 mg Oral BID   ??? ketorolac (TORADOL) injection 15 mg  15 mg IntraVENous Q6H PRN   ??? QUEtiapine SR (SEROquel XR) tablet 150 mg  150 mg Oral QHS   ??? revefenacin (YUPELRI) nebulizer solution 175 mcg  175 mcg Nebulization DAILY   ??? dextromethorphan (DELSYM) 30 mg/5 mL syrup 30 mg  30 mg Oral Q12H PRN   ??? gabapentin (NEURONTIN) capsule 300 mg  300 mg Oral TID   ??? montelukast (SINGULAIR) tablet 10 mg  10 mg Oral QHS   ??? naloxone (NARCAN) injection 0.1 mg  0.1 mg IntraVENous PRN   ??? acetaminophen (TYLENOL) tablet 650 mg  650 mg Oral Q4H PRN    Or   ??? acetaminophen (TYLENOL) solution 650 mg  650 mg Oral Q4H PRN    Or   ??? acetaminophen (TYLENOL) suppository 650 mg  650 mg Rectal Q4H PRN   ??? HYDROcodone-acetaminophen (NORCO) 5-325 mg per tablet 1 Tab  1 Tab Oral Q4H PRN   ??? ondansetron (ZOFRAN) injection  4 mg  4 mg IntraVENous Q4H PRN   ??? [Held by provider] heparin (porcine) injection 5,000 Units  5,000 Units SubCUTAneous Q8H   ??? arformoteroL (BROVANA) neb solution 15 mcg  15 mcg Nebulization BID RT   ??? budesonide (PULMICORT) 500 mcg/2 ml nebulizer suspension  500 mcg Nebulization BID RT   ??? albuterol (PROVENTIL VENTOLIN) nebulizer solution  2.5 mg  2.5 mg Nebulization Q2H PRN   ??? dextrose (D50W) injection syrg 5-25 g  10-50 mL IntraVENous PRN   ??? glucagon (GLUCAGEN) injection 1 mg  1 mg IntraMUSCular PRN   ??? insulin glargine (LANTUS) injection 1-100 Units  1-100 Units SubCUTAneous QHS   ??? insulin lispro (HUMALOG) injection 1-100 Units  1-100 Units SubCUTAneous AC&HS   ??? insulin lispro (HUMALOG) injection 1-100 Units  1-100 Units SubCUTAneous PRN        Care Plan discussed with: pt     Total time spent with patient: 25 minutes.    Darrin Nipper, MD  August 29, 2018  1045

## 2018-08-29 NOTE — Progress Notes (Signed)
Started patient on PEP/ Flutter valve therapy, to assist with mucus production and enhance lung expansion    Problem: Gas Exchange - Impaired  Goal: *Absence of hypoxia  Outcome: Progressing Towards Goal     Problem: Patient Education: Go to Patient Education Activity  Goal: Patient/Family Education  Outcome: Progressing Towards Goal

## 2018-08-29 NOTE — Progress Notes (Signed)
Overall feeling better today.  On nasal cannula.  She is wheezing.  Chest tube without air leak.  To my eyes the chest x-ray looks like there is still a small apical pneumothorax however it appears better than yesterday.  It is difficult to differentiate pneumothorax from bleb disease.  Continue chest tube to -40 cm of suction.

## 2018-08-29 NOTE — Progress Notes (Signed)
Pt called RN for headache 7/10. Pt was offered Tylenol and Norco, but refused and started to be anxious and said she needs Dilaudid IV instead.

## 2018-08-30 ENCOUNTER — Inpatient Hospital Stay: Admit: 2018-08-30 | Payer: MEDICARE | Primary: Physician Assistant

## 2018-08-30 LAB — RENAL FUNCTION PANEL
Albumin: 2.6 gm/dl — ABNORMAL LOW (ref 3.4–5.0)
Albumin: 2.6 gm/dl — ABNORMAL LOW (ref 3.4–5.0)
Anion Gap: 9 mmol/L (ref 5–15)
Anion gap: 9 mmol/L (ref 5–15)
BUN: 40 mg/dl — ABNORMAL HIGH (ref 7–25)
BUN: 40 mg/dl — ABNORMAL HIGH (ref 7–25)
CO2: 25 mEq/L (ref 21–32)
CO2: 25 mEq/L (ref 21–32)
Calcium: 8.2 mg/dl — ABNORMAL LOW (ref 8.5–10.1)
Calcium: 8.2 mg/dl — ABNORMAL LOW (ref 8.5–10.1)
Chloride: 97 mEq/L — ABNORMAL LOW (ref 98–107)
Chloride: 97 mEq/L — ABNORMAL LOW (ref 98–107)
Creatinine: 1 mg/dl (ref 0.6–1.3)
Creatinine: 1 mg/dl (ref 0.6–1.3)
EGFR IF NonAfrican American: 59
GFR African American: >60
GFR est AA: >60
GFR est non-AA: 59
Glucose: 311 mg/dl — ABNORMAL HIGH (ref 74–106)
Glucose: 311 mg/dl — ABNORMAL HIGH (ref 74–106)
Phosphorus: 3.1 mg/dl (ref 2.5–4.9)
Phosphorus: 3.1 mg/dl (ref 2.5–4.9)
Potassium: 5.6 mEq/L — ABNORMAL HIGH (ref 3.5–5.1)
Potassium: 5.6 mEq/L — ABNORMAL HIGH (ref 3.5–5.1)
Sodium: 131 mEq/L — ABNORMAL LOW (ref 136–145)
Sodium: 131 mEq/L — ABNORMAL LOW (ref 136–145)

## 2018-08-30 LAB — POCT GLUCOSE
POC Glucose: 213 mg/dL — ABNORMAL HIGH (ref 65–105)
POC Glucose: 282 mg/dL — ABNORMAL HIGH (ref 65–105)
POC Glucose: 269 mg/dL — ABNORMAL HIGH (ref 65–105)
POC Glucose: 274 mg/dL — ABNORMAL HIGH (ref 65–105)

## 2018-08-30 LAB — GLUCOSE, POC
Glucose (POC): 213 mg/dL — ABNORMAL HIGH (ref 65–105)
Glucose (POC): 282 mg/dL — ABNORMAL HIGH (ref 65–105)
Glucose (POC): 269 mg/dL — ABNORMAL HIGH (ref 65–105)
Glucose (POC): 274 mg/dL — ABNORMAL HIGH (ref 65–105)

## 2018-08-30 MED ORDER — FENTANYL CITRATE (PF) 50 MCG/ML IJ SOLN
50 mcg/mL | INTRAMUSCULAR | Status: DC | PRN
Start: 2018-08-30 — End: 2018-08-30

## 2018-08-30 MED ORDER — LIDOCAINE HCL 1 % (10 MG/ML) IJ SOLN
10 mg/mL (1 %) | INTRAMUSCULAR | Status: DC | PRN
Start: 2018-08-30 — End: 2018-08-30

## 2018-08-30 MED ORDER — FLUMAZENIL 0.1 MG/ML IV SOLN
0.1 mg/mL | INTRAVENOUS | Status: DC | PRN
Start: 2018-08-30 — End: 2018-08-30

## 2018-08-30 MED ORDER — FENTANYL CITRATE (PF) 50 MCG/ML IJ SOLN
50 mcg/mL | INTRAMUSCULAR | Status: AC
Start: 2018-08-30 — End: 2018-08-30
  Administered 2018-08-30: 16:00:00 via INTRAVENOUS

## 2018-08-30 MED FILL — CLONIDINE 0.1 MG TAB: 0.1 mg | ORAL | Qty: 3

## 2018-08-30 MED FILL — IPRATROPIUM-ALBUTEROL 2.5 MG-0.5 MG/3 ML NEB SOLUTION: 2.5 mg-0.5 mg/3 ml | RESPIRATORY_TRACT | Qty: 3

## 2018-08-30 MED FILL — GABAPENTIN 300 MG CAP: 300 mg | ORAL | Qty: 1

## 2018-08-30 MED FILL — MONTELUKAST 10 MG TAB: 10 mg | ORAL | Qty: 1

## 2018-08-30 MED FILL — MUCINEX 600 MG TABLET, EXTENDED RELEASE: 600 mg | ORAL | Qty: 2

## 2018-08-30 MED FILL — HYDROMORPHONE 1 MG/ML INJECTION SOLUTION: 1 mg/mL | INTRAMUSCULAR | Qty: 1

## 2018-08-30 MED FILL — SOLU-MEDROL (PF) 40 MG/ML SOLUTION FOR INJECTION: 40 mg/mL | INTRAMUSCULAR | Qty: 1

## 2018-08-30 MED FILL — DILTIAZEM ER 120 MG 24 HR CAP: 120 mg | ORAL | Qty: 2

## 2018-08-30 MED FILL — HEPARIN (PORCINE) 5,000 UNIT/ML IJ SOLN: 5000 unit/mL | INTRAMUSCULAR | Qty: 1

## 2018-08-30 MED FILL — QUETIAPINE SR 50 MG 24 HR TAB: 50 mg | ORAL | Qty: 3

## 2018-08-30 MED FILL — BROVANA 15 MCG/2 ML SOLUTION FOR NEBULIZATION: 15 mcg/2 mL | RESPIRATORY_TRACT | Qty: 1

## 2018-08-30 MED FILL — DOCUSATE SODIUM 100 MG CAP: 100 mg | ORAL | Qty: 1

## 2018-08-30 MED FILL — AZITHROMYCIN 250 MG TAB: 250 mg | ORAL | Qty: 2

## 2018-08-30 MED FILL — ACETYLCYSTEINE 10 % (100 MG/ML) SOLN: 100 mg/mL (10 %) | Qty: 4

## 2018-08-30 MED FILL — LOSARTAN 50 MG TAB: 50 mg | ORAL | Qty: 1

## 2018-08-30 MED FILL — BUDESONIDE 0.5 MG/2 ML NEB SUSPENSION: 0.5 mg/2 mL | RESPIRATORY_TRACT | Qty: 1

## 2018-08-30 MED FILL — FENTANYL CITRATE (PF) 50 MCG/ML IJ SOLN: 50 mcg/mL | INTRAMUSCULAR | Qty: 4

## 2018-08-30 MED FILL — YUPELRI 175 MCG/3 ML SOLUTION FOR NEBULIZATION: 175 mcg/3 mL | RESPIRATORY_TRACT | Qty: 1

## 2018-08-30 MED FILL — CETIRIZINE 5 MG TAB: 5 mg | ORAL | Qty: 2

## 2018-08-30 MED FILL — HYDROCODONE-ACETAMINOPHEN 5 MG-325 MG TAB: 5-325 mg | ORAL | Qty: 2

## 2018-08-30 NOTE — Progress Notes (Signed)
Patient no longer experiencing signs and symptoms of respiratory distress, BIPAP therapy D/C, will continue to monitor and assess patient

## 2018-08-30 NOTE — Progress Notes (Signed)
Patient feels better today.  Breathing is more comfortable today.  Chest tube still with no airleak.  Chest x-ray today suggest a persistent left apical space despite the chest tube.  We will repeat a CT scan and possible insertion of a new left apical chest tube depending on the results of such study.  Have discussed this with the patient.  Patient is n.p.o. and the heparin is held until a procedure is performed or aborted.

## 2018-08-30 NOTE — Progress Notes (Signed)
Currently off the floor.

## 2018-08-30 NOTE — Other (Signed)
Telephone report to 5 [employer]. Informed of MAR, and procedure canceled per Dr. Seth Bake. Lewis and attending talking. Opened for questions.

## 2018-08-30 NOTE — Other (Signed)
Bedside shift change report given to Angelica M Macias (oncoming nurse) by Paz (offgoing nurse). Report included the following information SBAR.

## 2018-08-30 NOTE — Progress Notes (Signed)
Hospitalist Progress Note         [facility]    Daily Progress Note: 08/30/2018    Assessment/Plan:   1.  Acute hypoxic respiratory failure: she had an RRT called on her this morning because she went into acute hypoxic respiratory failure tachycardia and tachypnea.  She appeared to be quite tight with only minimal wheezing at the bases but she appeared to be quite anxious.  -We will need to transfer her to stepdown at this time I will increase her Solu-Medrol to-25 mg IV x1 and restart Solu-Medrol 60 mg IV every 6 hours I have increased her DuoNeb to every 4 hours and make albuterol nebulizer every 2 hours and started Mucomyst 40 mg every 6 hours and Mucinex 1200 mg twice daily as the patient does have a seasonal allergy component.  -Started Ativan 0.5 mg IV every 6 hours as needed for anxiety as the patient does have PTSD.  -We will check a chest x-ray.  -I discussed with nursing staff respiratory therapy and stepdown the patient will need to be transferred to stepdown at this time.  -We will check an ABG given her acute hypoxic change but the patient is alert and oriented x3 and mentating very well.  - will check a chest xray to make certain there isnt an infiltrate contributing to her distress.  - on 3/18; left-sided chest tube placed on 317 after chest x-ray was obtained.              -Appreciate Dr. Assunta Found assistance.              -Chest tube has been minimal bloody drainage at this time. -40 cm of suction.  Chest tube without air leak.            -Chest x-ray does show improvement but still small apical pneumothorax.      -3/19; CT of thorax performed shows bullae and blebs.  There is mention of a 17 mm pulmonary left lung nodule.  No pneumothorax per CAT scan thorax.  -Clinically she feels better though.    2.  Acute exacerbation COPD. Called earlier by RT who noted Pt had been changed to Prednisone  every day, resumed Solumedrol  q 8h.      -Minimal expiratory wheezing we will continue Solu-Medrol IV every 8 hours at this time .    3.  Acute bronchitis  4.  HTN, uncontrolled: Due to acute dyspnea.       -We will increase losartan to 50 mg and increase diltiazem to 240 mg from         180 mg.  5.  T2DM  6.  Left upper lobe lung nodule, will need continued follow-up   7.  Dyslipidemia  8.  PTSD, she was [place], has had respiratory problems since.  RN notes anxiety @ times. Try prn Hydroxyzine  9.  Peripheral neuropathy  10.  GERD.    11.  DJD  12.  DVT proph w sc Heparin  13.  Dispo: Still probably needs a couple of days  Subjective:   Interval history:      3/19: She is lying in bed and states that she feels much better.  States her breathing is improved.  Appears to be quite happy with her progress.    Minimal drainage from chest tube.    3/18; she appears to be more comfortable today but obviously and still moderate amount of pain to the chest  tube which is to be expected.  Overall respiratory status is improved.  She states that her anxiety is controlled at this time.  She appears quite optimistic as she should be.      3/17: Rapid response team was called because she patient went into acute hypoxic respiratory failure and began to have tach tachycardia and tachypnea the patient had to be placed back on BiPAP at this time.  An ABG chest x-ray will be obtained we will have to escalate her nebulizer treatments.  We will need to move her to the stepdown at this time.  Respiratory is on board.  She does appear to be quite anxious and therefore gave Ativan 0.5 mg IV x1 and will make Ativan 0.5 mg IV every 6 hours as needed as patient does have a history of PTSD she was exposed to respiratory problems since we will treat started on 11.      Cardiovascular ROS: no chest pain ,  orthopnea  Gastrointestinal ROS: no abdominal pain, nausea,vomiting, diarrhea, melena or hematechezia   Genito-Urinary ROS: no dysuria, trouble voiding, or hematuria  Objective:   Physical Exam:     Visit Vitals  BP (!) 134/92 (BP 1 Location: Right arm, BP Patient Position: Supine)   Pulse 95   Temp 97.9 ??F (36.6 ??C)   Resp 18   Ht  (1.727 m)   Wt 95.2 kg (209 lb 12.8 oz)   SpO2 97%   BMI 31.90 kg/m??    O2 Flow Rate (L/min): 2 l/min O2 Device: Nasal cannula    Temp (24hrs), Avg:97.8 ??F (36.6 ??C), Min:97.3 ??F (36.3 ??C), Max:98 ??F (36.7 ??C)    03/19 0701 - 03/19 1900  In: -   Out: 400 [Urine:400]   03/17 1901 - 03/19 0700  In: 1800 [P.O.:1800]  Out: 3270 [Urine:3250]    General:  Alert, cooperative, no distress, appears stated age.   Lungs:    Still significant exp wheezes    :     Heart:  Regular rate and rhythm, S1, S2 normal, no murmur, click, rub or gallop.   Abdomen:   Soft, non-tender. Bowel sounds normal. No masses,  No organomegaly.   Extremities:  FROM, no LE edema   Pulses: 2+ and symmetric all extremities.   Skin: Skin color, texture, turgor normal. No rashes or lesions   :      Data Review:       24 Hour Results:  Recent Results (from the past 24 hour(s))   GLUCOSE, POC    Collection Time: 08/29/18  5:12 PM   Result Value Ref Range    Glucose (POC) 107 (H) 65 - 105 mg/dL   GLUCOSE, POC    Collection Time: 08/29/18  9:39 PM   Result Value Ref Range    Glucose (POC) 213 (H) 65 - 105 mg/dL   RENAL FUNCTION PANEL    Collection Time: 08/30/18  6:32 AM   Result Value Ref Range    Sodium 131 (L) 136 - 145 mEq/L    Potassium 5.6 (H) 3.5 - 5.1 mEq/L    Chloride 97 (L) 98 - 107 mEq/L    CO2 25 21 - 32 mEq/L    Glucose 311 (H) 74 - 106 mg/dl    BUN 40 (H) 7 - 25 mg/dl    Creatinine 1.0 0.6 - 1.3 mg/dl    GFR est AA >24.4      GFR est non-AA 59      Calcium  8.2 (L) 8.5 - 10.1 mg/dl    Albumin 2.6 (L) 3.4 - 5.0 gm/dl    Phosphorus 3.1 2.5 - 4.9 mg/dl    Anion gap 9 5 - 15 mmol/L   GLUCOSE, POC    Collection Time: 08/30/18  7:51 AM   Result Value Ref Range    Glucose (POC) 282 (H) 65 - 105 mg/dL   GLUCOSE, POC     Collection Time: 08/30/18 12:15 PM   Result Value Ref Range    Glucose (POC) 269 (H) 65 - 105 mg/dL       Problem List:  Problem List as of 08/30/2018 Date Reviewed: Aug 25, 2018          Codes Class Noted - Resolved    Respiratory failure with hypoxia [facility]) ICD-10-CM: J96.91  ICD-9-CM: 518.81  08/22/2018 - Present        Headache ICD-10-CM: R51  ICD-9-CM: 784.0  08/05/2018 - Present        Abdominal pain ICD-10-CM: R10.9  ICD-9-CM: 789.00  02/04/2018 - Present        SIRS (systemic inflammatory response syndrome) (HCC) ICD-10-CM: R65.10  ICD-9-CM: 995.90  10/09/2017 - Present        Hyperkalemia ICD-10-CM: E87.5  ICD-9-CM: 276.7  09/08/2017 - Present        Obtunded ICD-10-CM: R40.1  ICD-9-CM: 780.09  09/08/2017 - Present        Acute renal failure (ARF) (HCC) ICD-10-CM: N17.9  ICD-9-CM: 584.9  09/08/2017 - Present        Septic shock (HCC) ICD-10-CM: A41.9, R65.21  ICD-9-CM: 038.9, 785.52, 995.92  09/08/2017 - Present        Metabolic encephalopathy ICD-10-CM: G93.41  ICD-9-CM: 348.31  09/08/2017 - Present        Dehydration ICD-10-CM: E86.0  ICD-9-CM: 276.51  08/07/2017 - Present        COPD with acute exacerbation (HCC) ICD-10-CM: J44.1  ICD-9-CM: 491.21  08/07/2017 - Present        Acute-on-chronic kidney injury (HCC) ICD-10-CM: N17.9, N18.9  ICD-9-CM: 584.9, 585.9  08/07/2017 - Present        Chest pain ICD-10-CM: R07.9  ICD-9-CM: 786.50  06/15/2017 - Present        Dyspnea ICD-10-CM: R06.00  ICD-9-CM: 786.09  06/15/2017 - Present        Type 2 diabetes mellitus with diabetic neuropathy (HCC) ICD-10-CM: E11.40  ICD-9-CM: 250.60, 357.2  12/07/2016 - Present        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  ICD-9-CM: 569.89  08/17/2016 - Present        Cannabinoid hyperemesis syndrome (HCC) ICD-10-CM: F12.988  ICD-9-CM: 536.2, 305.20  08/17/2016 - Present        Gastritis ICD-10-CM: K29.70  ICD-9-CM: 535.50  08/16/2016 - Present        Nausea & vomiting ICD-10-CM: R11.2  ICD-9-CM: 787.01  08/16/2016 - Present         Asthma with acute exacerbation ICD-10-CM: J45.901  ICD-9-CM: 493.92  08/04/2016 - Present        Lactic acidosis ICD-10-CM: E87.2  ICD-9-CM: 276.2  07/24/2016 - Present        Leukocytosis ICD-10-CM: D72.829  ICD-9-CM: 288.60  07/24/2016 - Present        Sepsis (HCC) ICD-10-CM: A41.9  ICD-9-CM: 038.9, 995.91  07/24/2016 - Present        Asthma exacerbation ICD-10-CM: J45.901  ICD-9-CM: 493.92  07/24/2016 - Present        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: E11.21  ICD-9-CM: 250.40, 583.81  06/12/2016 - Present        Sacroiliitis (HCC) ICD-10-CM: M46.1  ICD-9-CM: 720.2  11/25/2015 - Present        Essential hypertension ICD-10-CM: I10  ICD-9-CM: 401.9  10/06/2015 - Present        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  ICD-9-CM: 721.3  09/15/2015 - Present        Lumbar and sacral osteoarthritis ICD-10-CM: M47.817  ICD-9-CM: 721.3  09/15/2015 - Present        Chronic pain syndrome ICD-10-CM: G89.4  ICD-9-CM: 338.4  09/15/2015 - Present        Type 2 diabetes mellitus with hyperglycemia, without long-term current use of insulin (HCC) ICD-10-CM: E11.65  ICD-9-CM: 250.00, 790.29  08/20/2015 - Present        Acute colitis ICD-10-CM: K52.9  ICD-9-CM: 558.9  07/02/2015 - Present        Acute hyperglycemia ICD-10-CM: R73.9  ICD-9-CM: 790.29  07/02/2015 - Present        Accelerated hypertension ICD-10-CM: I10  ICD-9-CM: 401.0  04/08/2015 - Present        Severe headache ICD-10-CM: R51  ICD-9-CM: 784.0  04/07/2015 - Present        Osteoarthritis of hips, 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  ICD-9-CM: 715.95  01/29/2015 - Present        PTSD (post-traumatic stress disorder) (Chronic) ICD-10-CM: F43.10  ICD-9-CM: 309.81  01/29/2015 - Present        Severe hypertension (Chronic) ICD-10-CM: I10  ICD-9-CM: 401.9  07/21/2014 - Present        RESOLVED: COPD exacerbation (HCC) ICD-10-CM: J44.1  ICD-9-CM: 491.21  11/14/2017 - 08/08/2018         RESOLVED: New onset type 1 diabetes mellitus, uncontrolled (HCC) ICD-10-CM: E10.65  ICD-9-CM: 250.03  07/02/2015 - 10/06/2015        RESOLVED: Recurrent major depressive disorder, in full remission (HCC) ICD-10-CM: F33.42  ICD-9-CM: 296.36  12/02/2014 - 12/02/2014        RESOLVED: Foreign body in colon ICD-10-CM: T18.4XXA  ICD-9-CM: 936  08/14/2014 - 01/29/2015        RESOLVED: Advanced care planning/counseling discussion ICD-10-CM: Z71.89  ICD-9-CM: V65.49  07/21/2014 - 01/29/2015    Overview Signed 07/21/2014  3:46 PM by Littie Deeds     Patient given [place] application               RESOLVED: Hip pain, chronic ICD-10-CM: M25.559, G89.29  ICD-9-CM: 719.45, 338.29  07/21/2014 - 01/29/2015               Medications reviewed  Current Facility-Administered Medications   Medication Dose Route Frequency   ??? dilTIAZem CD (CARDIZEM CD) capsule 240 mg  240 mg Oral DAILY   ??? losartan (COZAAR) tablet 50 mg  50 mg Oral DAILY   ??? polyethylene glycol (MIRALAX) packet 17 g  17 g Oral BID PRN   ??? bisacodyL (DULCOLAX) suppository 10 mg  10 mg Rectal DAILY PRN   ??? azithromycin (ZITHROMAX) tablet 500 mg  500 mg Oral DAILY   ??? methylPREDNISolone (PF) (SOLU-MEDROL) injection 40 mg  40 mg IntraVENous Q6H   ??? albuterol-ipratropium (DUO-NEB) 2.5 MG-0.5 MG/3 ML  3 mL Nebulization Q4H RT   ??? guaiFENesin ER (MUCINEX) tablet 1,200 mg  1,200 mg Oral Q12H   ??? LORazepam (ATIVAN) injection 0.5 mg  0.5 mg IntraVENous Q6H PRN   ??? cetirizine (ZYRTEC) tablet 10 mg  10 mg Oral DAILY   ???  fluticasone propionate (FLONASE) 50 mcg/actuation nasal spray 2 Spray  2 Spray Both Nostrils BID   ??? acetylcysteine (MUCOMYST) 100 mg/mL (10 %) nebulizer solution 400 mg  4 mL Nebulization Q8H RT   ??? HYDROcodone-acetaminophen (NORCO) 5-325 mg per tablet 1-2 Tab  1-2 Tab Oral Q4H PRN   ??? HYDROmorphone (DILAUDID) injection 0.5-1 mg  0.5-1 mg IntraVENous Q3H PRN   ??? docusate sodium (COLACE) capsule 100 mg  100 mg Oral BID    ??? hydrOXYzine HCL (ATARAX) tablet 25 mg  25 mg Oral TID PRN   ??? cloNIDine HCL (CATAPRES) tablet 0.3 mg  0.3 mg Oral BID   ??? QUEtiapine SR (SEROquel XR) tablet 150 mg  150 mg Oral QHS   ??? revefenacin (YUPELRI) nebulizer solution 175 mcg  175 mcg Nebulization DAILY   ??? dextromethorphan (DELSYM) 30 mg/5 mL syrup 30 mg  30 mg Oral Q12H PRN   ??? gabapentin (NEURONTIN) capsule 300 mg  300 mg Oral TID   ??? montelukast (SINGULAIR) tablet 10 mg  10 mg Oral QHS   ??? naloxone (NARCAN) injection 0.1 mg  0.1 mg IntraVENous PRN   ??? acetaminophen (TYLENOL) tablet 650 mg  650 mg Oral Q4H PRN    Or   ??? acetaminophen (TYLENOL) solution 650 mg  650 mg Oral Q4H PRN    Or   ??? acetaminophen (TYLENOL) suppository 650 mg  650 mg Rectal Q4H PRN   ??? HYDROcodone-acetaminophen (NORCO) 5-325 mg per tablet 1 Tab  1 Tab Oral Q4H PRN   ??? ondansetron (ZOFRAN) injection 4 mg  4 mg IntraVENous Q4H PRN   ??? [Held by provider] heparin (porcine) injection 5,000 Units  5,000 Units SubCUTAneous Q8H   ??? arformoteroL (BROVANA) neb solution 15 mcg  15 mcg Nebulization BID RT   ??? budesonide (PULMICORT) 500 mcg/2 ml nebulizer suspension  500 mcg Nebulization BID RT   ??? dextrose (D50W) injection syrg 5-25 g  10-50 mL IntraVENous PRN   ??? glucagon (GLUCAGEN) injection 1 mg  1 mg IntraMUSCular PRN   ??? insulin glargine (LANTUS) injection 1-100 Units  1-100 Units SubCUTAneous QHS   ??? insulin lispro (HUMALOG) injection 1-100 Units  1-100 Units SubCUTAneous AC&HS   ??? insulin lispro (HUMALOG) injection 1-100 Units  1-100 Units SubCUTAneous PRN        Care Plan discussed with: pt     Total time spent with patient: 25 minutes.    Darrin Nipper, MD  August 30, 2018  1045

## 2018-08-30 NOTE — Progress Notes (Signed)
Hospitalist Progress Note         [facility]    Daily Progress Note: 08/30/2018    Assessment/Plan:   1.  Acute hypoxic respiratory failure: she had an RRT called on her this morning because she went into acute hypoxic respiratory failure tachycardia and tachypnea.  She appeared to be quite tight with only minimal wheezing at the bases but she appeared to be quite anxious.  -We will need to transfer her to stepdown at this time I will increase her Solu-Medrol to-25 mg IV x1 and restart Solu-Medrol 60 mg IV every 6 hours I have increased her DuoNeb to every 4 hours and make albuterol nebulizer every 2 hours and started Mucomyst 40 mg every 6 hours and Mucinex 1200 mg twice daily as the patient does have a seasonal allergy component.  -Started Ativan 0.5 mg IV every 6 hours as needed for anxiety as the patient does have PTSD.  -We will check a chest x-ray.  -I discussed with nursing staff respiratory therapy and stepdown the patient will need to be transferred to stepdown at this time.  -We will check an ABG given her acute hypoxic change but the patient is alert and oriented x3 and mentating very well.  - will check a chest xray to make certain there isnt an infiltrate contributing to her distress.  - on 3/18; left-sided chest tube placed on 317 after chest x-ray was obtained.              -Appreciate Dr. Assunta Found assistance.              -Chest tube has been minimal bloody drainage at this time. -40 cm of suction.  Chest tube without air leak.            -Chest x-ray does show improvement but still small apical pneumothorax.      -3/19; CT of thorax performed shows bullae and blebs.  There is mention of a 17 mm pulmonary left lung nodule.  No pneumothorax per CAT scan thorax.  -Clinically she feels better though.    2.  Acute exacerbation COPD. Called earlier by RT who noted Pt had been changed to Prednisone  every day, resumed Solumedrol  q 8h.     -Minimal expiratory wheezing we will continue  Solu-Medrol IV every 8 hours at this time .    3.  Acute bronchitis  4.  HTN, uncontrolled: Due to acute dyspnea.       -We will increase losartan to 50 mg and increase diltiazem to 240 mg from         180 mg.  5.  T2DM  6.  Left upper lobe lung nodule, will need continued follow-up   7.  Dyslipidemia  8.  PTSD, she was [place], has had respiratory problems since.  RN notes anxiety @ times. Try prn Hydroxyzine  9.  Peripheral neuropathy  10.  GERD.    11.  DJD  12.  DVT proph w sc Heparin  13.  Dispo: Still probably needs a couple of days  Subjective:   Interval history:      3/19: She is lying in bed and states that she feels much better.  States her breathing is improved.  Appears to be quite happy with her progress.    Minimal drainage from chest tube.    3/18; she appears to be more comfortable today but obviously and still moderate amount of pain to the chest  tube which is to be expected.  Overall respiratory status is improved.  She states that her anxiety is controlled at this time.  She appears quite optimistic as she should be.      3/17: Rapid response team was called because she patient went into acute hypoxic respiratory failure and began to have tach tachycardia and tachypnea the patient had to be placed back on BiPAP at this time.  An ABG chest x-ray will be obtained we will have to escalate her nebulizer treatments.  We will need to move her to the stepdown at this time.  Respiratory is on board.  She does appear to be quite anxious and therefore gave Ativan 0.5 mg IV x1 and will make Ativan 0.5 mg IV every 6 hours as needed as patient does have a history of PTSD she was exposed to respiratory problems since we will treat started on 11.      Cardiovascular ROS: no chest pain ,  orthopnea  Gastrointestinal ROS: no abdominal pain, nausea,vomiting, diarrhea, melena or hematechezia  Genito-Urinary ROS: no dysuria, trouble voiding, or hematuria  Objective:   Physical Exam:     Visit  Vitals  BP (!) 134/92 (BP 1 Location: Right arm, BP Patient Position: Supine)   Pulse 95   Temp 97.9 ??F (36.6 ??C)   Resp 18   Ht 5\' 8"  (1.727 m)   Wt 95.2 kg (209 lb 12.8 oz)   SpO2 97%   BMI 31.90 kg/m??    O2 Flow Rate (L/min): 2 l/min O2 Device: Nasal cannula    Temp (24hrs), Avg:97.8 ??F (36.6 ??C), Min:97.3 ??F (36.3 ??C), Max:98 ??F (36.7 ??C)    03/19 0701 - 03/19 1900  In: -   Out: 400 [Urine:400]   03/17 1901 - 03/19 0700  In: 1800 [P.O.:1800]  Out: 3270 [Urine:3250]    General:  Alert, cooperative, no distress, appears stated age.   Lungs:    Still significant exp wheezes    :     Heart:  Regular rate and rhythm, S1, S2 normal, no murmur, click, rub or gallop.   Abdomen:   Soft, non-tender. Bowel sounds normal. No masses,  No organomegaly.   Extremities:  FROM, no LE edema   Pulses: 2+ and symmetric all extremities.   Skin: Skin color, texture, turgor normal. No rashes or lesions   :      Data Review:       24 Hour Results:  Recent Results (from the past 24 hour(s))   GLUCOSE, POC    Collection Time: 08/29/18  5:12 PM   Result Value Ref Range    Glucose (POC) 107 (H) 65 - 105 mg/dL   GLUCOSE, POC    Collection Time: 08/29/18  9:39 PM   Result Value Ref Range    Glucose (POC) 213 (H) 65 - 105 mg/dL   RENAL FUNCTION PANEL    Collection Time: 08/30/18  6:32 AM   Result Value Ref Range    Sodium 131 (L) 136 - 145 mEq/L    Potassium 5.6 (H) 3.5 - 5.1 mEq/L    Chloride 97 (L) 98 - 107 mEq/L    CO2 25 21 - 32 mEq/L    Glucose 311 (H) 74 - 106 mg/dl    BUN 40 (H) 7 - 25 mg/dl    Creatinine 1.0 0.6 - 1.3 mg/dl    GFR est AA >42.3      GFR est non-AA 59      Calcium  8.2 (L) 8.5 - 10.1 mg/dl    Albumin 2.6 (L) 3.4 - 5.0 gm/dl    Phosphorus 3.1 2.5 - 4.9 mg/dl    Anion gap 9 5 - 15 mmol/L   GLUCOSE, POC    Collection Time: 08/30/18  7:51 AM   Result Value Ref Range    Glucose (POC) 282 (H) 65 - 105 mg/dL   GLUCOSE, POC    Collection Time: 08/30/18 12:15 PM   Result Value Ref Range    Glucose (POC) 269 (H) 65 - 105 mg/dL        Problem List:  Problem List as of 08/30/2018 Date Reviewed: 08/08/2018          Codes Class Noted - Resolved    Respiratory failure with hypoxia [facility](HCC) ICD-10-CM: J96.91  ICD-9-CM: 518.81  08/22/2018 - Present        Headache ICD-10-CM: R51  ICD-9-CM: 784.0  08/05/2018 - Present        Abdominal pain ICD-10-CM: R10.9  ICD-9-CM: 789.00  02/04/2018 - Present        SIRS (systemic inflammatory response syndrome) (HCC) ICD-10-CM: R65.10  ICD-9-CM: 995.90  10/09/2017 - Present        Hyperkalemia ICD-10-CM: E87.5  ICD-9-CM: 276.7  09/08/2017 - Present        Obtunded ICD-10-CM: R40.1  ICD-9-CM: 780.09  09/08/2017 - Present        Acute renal failure (ARF) (HCC) ICD-10-CM: N17.9  ICD-9-CM: 584.9  09/08/2017 - Present        Septic shock (HCC) ICD-10-CM: A41.9, R65.21  ICD-9-CM: 038.9, 785.52, 995.92  09/08/2017 - Present        Metabolic encephalopathy ICD-10-CM: G93.41  ICD-9-CM: 348.31  09/08/2017 - Present        Dehydration ICD-10-CM: E86.0  ICD-9-CM: 276.51  08/07/2017 - Present        COPD with acute exacerbation (HCC) ICD-10-CM: J44.1  ICD-9-CM: 491.21  08/07/2017 - Present        Acute-on-chronic kidney injury (HCC) ICD-10-CM: N17.9, N18.9  ICD-9-CM: 584.9, 585.9  08/07/2017 - Present        Chest pain ICD-10-CM: R07.9  ICD-9-CM: 786.50  06/15/2017 - Present        Dyspnea ICD-10-CM: R06.00  ICD-9-CM: 786.09  06/15/2017 - Present        Type 2 diabetes mellitus with diabetic neuropathy (HCC) ICD-10-CM: E11.40  ICD-9-CM: 250.60, 357.2  12/07/2016 - Present        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  ICD-9-CM: 569.89  08/17/2016 - Present        Cannabinoid hyperemesis syndrome (HCC) ICD-10-CM: F12.988  ICD-9-CM: 536.2, 305.20  08/17/2016 - Present        Gastritis ICD-10-CM: K29.70  ICD-9-CM: 535.50  08/16/2016 - Present        Nausea & vomiting ICD-10-CM: R11.2  ICD-9-CM: 787.01  08/16/2016 - Present        Asthma with acute exacerbation ICD-10-CM: J45.901  ICD-9-CM: 493.92  08/04/2016 - Present        Lactic acidosis ICD-10-CM:  E87.2  ICD-9-CM: 276.2  07/24/2016 - Present        Leukocytosis ICD-10-CM: D72.829  ICD-9-CM: 288.60  07/24/2016 - Present        Sepsis (HCC) ICD-10-CM: A41.9  ICD-9-CM: 038.9, 995.91  07/24/2016 - Present        Asthma exacerbation ICD-10-CM: J45.901  ICD-9-CM: 493.92  07/24/2016 - Present        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: E11.21  ICD-9-CM: 250.40, 583.81  06/12/2016 - Present        Sacroiliitis (HCC) ICD-10-CM: M46.1  ICD-9-CM: 720.2  11/25/2015 - Present        Essential hypertension ICD-10-CM: I10  ICD-9-CM: 401.9  10/06/2015 - Present        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  ICD-9-CM: 721.3  09/15/2015 - Present        Lumbar and sacral osteoarthritis ICD-10-CM: M47.817  ICD-9-CM: 721.3  09/15/2015 - Present        Chronic pain syndrome ICD-10-CM: G89.4  ICD-9-CM: 338.4  09/15/2015 - Present        Type 2 diabetes mellitus with hyperglycemia, without long-term current use of insulin (HCC) ICD-10-CM: E11.65  ICD-9-CM: 250.00, 790.29  08/20/2015 - Present        Acute colitis ICD-10-CM: K52.9  ICD-9-CM: 558.9  07/02/2015 - Present        Acute hyperglycemia ICD-10-CM: R73.9  ICD-9-CM: 790.29  07/02/2015 - Present        Accelerated hypertension ICD-10-CM: I10  ICD-9-CM: 401.0  04/08/2015 - Present        Severe headache ICD-10-CM: R51  ICD-9-CM: 784.0  04/07/2015 - Present        Osteoarthritis of hips, 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  ICD-9-CM: 715.95  01/29/2015 - Present        PTSD (post-traumatic stress disorder) (Chronic) ICD-10-CM: F43.10  ICD-9-CM: 309.81  01/29/2015 - Present        Severe hypertension (Chronic) ICD-10-CM: I10  ICD-9-CM: 401.9  07/21/2014 - Present        RESOLVED: COPD exacerbation (HCC) ICD-10-CM: J44.1  ICD-9-CM: 491.21  11/14/2017 - 08/08/2018        RESOLVED: New onset type 1 diabetes mellitus, uncontrolled (HCC) ICD-10-CM: E10.65  ICD-9-CM: 250.03  07/02/2015 - 10/06/2015        RESOLVED: Recurrent major depressive disorder,  in full remission (HCC) ICD-10-CM: F33.42  ICD-9-CM: 296.36  12/02/2014 - 12/02/2014        RESOLVED: Foreign body in colon ICD-10-CM: T18.4XXA  ICD-9-CM: 936  08/14/2014 - 01/29/2015        RESOLVED: Advanced care planning/counseling discussion ICD-10-CM: Z71.89  ICD-9-CM: V65.49  07/21/2014 - 01/29/2015    Overview Signed 07/21/2014  3:46 PM by Littie Deeds     Patient given [place] application               RESOLVED: Hip pain, chronic ICD-10-CM: M25.559, G89.29  ICD-9-CM: 719.45, 338.29  07/21/2014 - 01/29/2015               Medications reviewed  Current Facility-Administered Medications   Medication Dose Route Frequency   ??? dilTIAZem CD (CARDIZEM CD) capsule 240 mg  240 mg Oral DAILY   ??? losartan (COZAAR) tablet 50 mg  50 mg Oral DAILY   ??? polyethylene glycol (MIRALAX) packet 17 g  17 g Oral BID PRN   ??? bisacodyL (DULCOLAX) suppository 10 mg  10 mg Rectal DAILY PRN   ??? azithromycin (ZITHROMAX) tablet 500 mg  500 mg Oral DAILY   ??? methylPREDNISolone (PF) (SOLU-MEDROL) injection 40 mg  40 mg IntraVENous Q6H   ??? albuterol-ipratropium (DUO-NEB) 2.5 MG-0.5 MG/3 ML  3 mL Nebulization Q4H RT   ??? guaiFENesin ER (MUCINEX) tablet 1,200 mg  1,200 mg Oral Q12H   ??? LORazepam (ATIVAN) injection 0.5 mg  0.5 mg IntraVENous Q6H PRN   ??? cetirizine (ZYRTEC) tablet 10 mg  10 mg Oral DAILY   ???  fluticasone propionate (FLONASE) 50 mcg/actuation nasal spray 2 Spray  2 Spray Both Nostrils BID   ??? acetylcysteine (MUCOMYST) 100 mg/mL (10 %) nebulizer solution 400 mg  4 mL Nebulization Q8H RT   ??? HYDROcodone-acetaminophen (NORCO) 5-325 mg per tablet 1-2 Tab  1-2 Tab Oral Q4H PRN   ??? HYDROmorphone (DILAUDID) injection 0.5-1 mg  0.5-1 mg IntraVENous Q3H PRN   ??? docusate sodium (COLACE) capsule 100 mg  100 mg Oral BID   ??? hydrOXYzine HCL (ATARAX) tablet 25 mg  25 mg Oral TID PRN   ??? cloNIDine HCL (CATAPRES) tablet 0.3 mg  0.3 mg Oral BID   ??? QUEtiapine SR (SEROquel XR) tablet 150 mg  150 mg Oral QHS   ??? revefenacin (YUPELRI) nebulizer  solution 175 mcg  175 mcg Nebulization DAILY   ??? dextromethorphan (DELSYM) 30 mg/5 mL syrup 30 mg  30 mg Oral Q12H PRN   ??? gabapentin (NEURONTIN) capsule 300 mg  300 mg Oral TID   ??? montelukast (SINGULAIR) tablet 10 mg  10 mg Oral QHS   ??? naloxone (NARCAN) injection 0.1 mg  0.1 mg IntraVENous PRN   ??? acetaminophen (TYLENOL) tablet 650 mg  650 mg Oral Q4H PRN    Or   ??? acetaminophen (TYLENOL) solution 650 mg  650 mg Oral Q4H PRN    Or   ??? acetaminophen (TYLENOL) suppository 650 mg  650 mg Rectal Q4H PRN   ??? HYDROcodone-acetaminophen (NORCO) 5-325 mg per tablet 1 Tab  1 Tab Oral Q4H PRN   ??? ondansetron (ZOFRAN) injection 4 mg  4 mg IntraVENous Q4H PRN   ??? [Held by provider] heparin (porcine) injection 5,000 Units  5,000 Units SubCUTAneous Q8H   ??? arformoteroL (BROVANA) neb solution 15 mcg  15 mcg Nebulization BID RT   ??? budesonide (PULMICORT) 500 mcg/2 ml nebulizer suspension  500 mcg Nebulization BID RT   ??? dextrose (D50W) injection syrg 5-25 g  10-50 mL IntraVENous PRN   ??? glucagon (GLUCAGEN) injection 1 mg  1 mg IntraMUSCular PRN   ??? insulin glargine (LANTUS) injection 1-100 Units  1-100 Units SubCUTAneous QHS   ??? insulin lispro (HUMALOG) injection 1-100 Units  1-100 Units SubCUTAneous AC&HS   ??? insulin lispro (HUMALOG) injection 1-100 Units  1-100 Units SubCUTAneous PRN        Care Plan discussed with: pt     Total time spent with patient: 25 minutes.    Darrin Nipper, MD  August 30, 2018  1045

## 2018-08-30 NOTE — Progress Notes (Signed)
Patient feels better today.  Breathing is more comfortable today.  Chest tube still with no airleak.  Chest x-ray today suggest a persistent left apical space despite the chest tube.  We will repeat a CT scan and possible insertion of a new left apical chest tube depending on the results of such study.  Have discussed this with the patient.  Patient is n.p.o. and the heparin is held until a procedure is performed or aborted.

## 2018-08-30 NOTE — Progress Notes (Signed)
Currently off the floor.

## 2018-08-30 NOTE — Progress Notes (Signed)
Patient no longer experiencing signs and symptoms of respiratory distress, BIPAP therapy D/C, will continue to monitor and assess patient

## 2018-08-31 ENCOUNTER — Inpatient Hospital Stay: Admit: 2018-08-31 | Payer: MEDICARE | Primary: Physician Assistant

## 2018-08-31 LAB — RENAL FUNCTION PANEL
Albumin: 2.6 gm/dl — ABNORMAL LOW (ref 3.4–5.0)
Albumin: 2.6 gm/dl — ABNORMAL LOW (ref 3.4–5.0)
Anion Gap: 9 mmol/L (ref 5–15)
Anion gap: 9 mmol/L (ref 5–15)
BUN: 37 mg/dl — ABNORMAL HIGH (ref 7–25)
BUN: 37 mg/dl — ABNORMAL HIGH (ref 7–25)
CO2: 26 mEq/L (ref 21–32)
CO2: 26 mEq/L (ref 21–32)
Calcium: 8.6 mg/dl (ref 8.5–10.1)
Calcium: 8.6 mg/dl (ref 8.5–10.1)
Chloride: 97 mEq/L — ABNORMAL LOW (ref 98–107)
Chloride: 97 mEq/L — ABNORMAL LOW (ref 98–107)
Creatinine: 0.9 mg/dl (ref 0.6–1.3)
Creatinine: 0.9 mg/dl (ref 0.6–1.3)
EGFR IF NonAfrican American: >60
GFR African American: >60
GFR est AA: >60
GFR est non-AA: >60
Glucose: 304 mg/dl — ABNORMAL HIGH (ref 74–106)
Glucose: 304 mg/dl — ABNORMAL HIGH (ref 74–106)
Phosphorus: 2.5 mg/dl (ref 2.5–4.9)
Phosphorus: 2.5 mg/dl (ref 2.5–4.9)
Potassium: 5.1 mEq/L (ref 3.5–5.1)
Potassium: 5.1 mEq/L (ref 3.5–5.1)
Sodium: 131 mEq/L — ABNORMAL LOW (ref 136–145)
Sodium: 131 mEq/L — ABNORMAL LOW (ref 136–145)

## 2018-08-31 LAB — POCT GLUCOSE
POC Glucose: 199 mg/dL — ABNORMAL HIGH (ref 65–105)
POC Glucose: 286 mg/dL — ABNORMAL HIGH (ref 65–105)
POC Glucose: 331 mg/dL — ABNORMAL HIGH (ref 65–105)
POC Glucose: 267 mg/dL — ABNORMAL HIGH (ref 65–105)

## 2018-08-31 LAB — GLUCOSE, POC
Glucose (POC): 199 mg/dL — ABNORMAL HIGH (ref 65–105)
Glucose (POC): 286 mg/dL — ABNORMAL HIGH (ref 65–105)
Glucose (POC): 331 mg/dL — ABNORMAL HIGH (ref 65–105)
Glucose (POC): 267 mg/dL — ABNORMAL HIGH (ref 65–105)

## 2018-08-31 MED FILL — DOCUSATE SODIUM 100 MG CAP: 100 mg | ORAL | Qty: 1

## 2018-08-31 MED FILL — IPRATROPIUM-ALBUTEROL 2.5 MG-0.5 MG/3 ML NEB SOLUTION: 2.5 mg-0.5 mg/3 ml | RESPIRATORY_TRACT | Qty: 3

## 2018-08-31 MED FILL — LOSARTAN 50 MG TAB: 50 mg | ORAL | Qty: 1

## 2018-08-31 MED FILL — HEPARIN (PORCINE) 5,000 UNIT/ML IJ SOLN: 5000 unit/mL | INTRAMUSCULAR | Qty: 1

## 2018-08-31 MED FILL — HYDROMORPHONE 1 MG/ML INJECTION SOLUTION: 1 mg/mL | INTRAMUSCULAR | Qty: 1

## 2018-08-31 MED FILL — ACETYLCYSTEINE 10 % (100 MG/ML) SOLN: 100 mg/mL (10 %) | Qty: 4

## 2018-08-31 MED FILL — BUDESONIDE 0.5 MG/2 ML NEB SUSPENSION: 0.5 mg/2 mL | RESPIRATORY_TRACT | Qty: 1

## 2018-08-31 MED FILL — SOLU-MEDROL (PF) 40 MG/ML SOLUTION FOR INJECTION: 40 mg/mL | INTRAMUSCULAR | Qty: 1

## 2018-08-31 MED FILL — HYDROCODONE-ACETAMINOPHEN 5 MG-325 MG TAB: 5-325 mg | ORAL | Qty: 2

## 2018-08-31 MED FILL — MUCINEX 600 MG TABLET, EXTENDED RELEASE: 600 mg | ORAL | Qty: 2

## 2018-08-31 MED FILL — BROVANA 15 MCG/2 ML SOLUTION FOR NEBULIZATION: 15 mcg/2 mL | RESPIRATORY_TRACT | Qty: 1

## 2018-08-31 MED FILL — GABAPENTIN 300 MG CAP: 300 mg | ORAL | Qty: 1

## 2018-08-31 MED FILL — QUETIAPINE SR 50 MG 24 HR TAB: 50 mg | ORAL | Qty: 3

## 2018-08-31 MED FILL — YUPELRI 175 MCG/3 ML SOLUTION FOR NEBULIZATION: 175 mcg/3 mL | RESPIRATORY_TRACT | Qty: 1

## 2018-08-31 MED FILL — CLONIDINE 0.1 MG TAB: 0.1 mg | ORAL | Qty: 3

## 2018-08-31 MED FILL — MONTELUKAST 10 MG TAB: 10 mg | ORAL | Qty: 1

## 2018-08-31 MED FILL — AZITHROMYCIN 250 MG TAB: 250 mg | ORAL | Qty: 2

## 2018-08-31 MED FILL — DILTIAZEM ER 120 MG 24 HR CAP: 120 mg | ORAL | Qty: 2

## 2018-08-31 MED FILL — CETIRIZINE 5 MG TAB: 5 mg | ORAL | Qty: 2

## 2018-08-31 NOTE — Progress Notes (Signed)
Volunteer Chaplain Visit this date.  Prayer provided.

## 2018-08-31 NOTE — Other (Signed)
Bedside shift change report given to Angelica M Macias   (oncoming nurse) by Paz (offgoing nurse). Report included the following information SBAR.

## 2018-08-31 NOTE — Progress Notes (Signed)
Hospitalist Progress Note         [facility]    Daily Progress Note: 08/31/2018    Assessment/Plan:   1.  Acute hypoxic respiratory failure: she had an RRT called on her this morning because she went into acute hypoxic respiratory failure tachycardia and tachypnea.  She appeared to be quite tight with only minimal wheezing at the bases but she appeared to be quite anxious.  -We will need to transfer her to stepdown at this time I will increase her Solu-Medrol to-25 mg IV x1 and restart Solu-Medrol 60 mg IV every 6 hours I have increased her DuoNeb to every 4 hours and make albuterol nebulizer every 2 hours and started Mucomyst 40 mg every 6 hours and Mucinex 1200 mg twice daily as the patient does have a seasonal allergy component.  -Started Ativan 0.5 mg IV every 6 hours as needed for anxiety as the patient does have PTSD.  -We will check a chest x-ray.  -I discussed with nursing staff respiratory therapy and stepdown the patient will need to be transferred to stepdown at this time.  -We will check an ABG given her acute hypoxic change but the patient is alert and oriented x3 and mentating very well.  - will check a chest xray to make certain there isnt an infiltrate contributing to her distress.  - on 3/18; left-sided chest tube placed on 317 after chest x-ray was obtained.              -Appreciate Dr. Assunta Found assistance.              -Chest tube has been minimal bloody drainage at this time. -40 cm of suction.  Chest tube without air leak.            -Chest x-ray does show improvement but still small apical pneumothorax.      -3/19; CT of thorax performed shows bullae and blebs.  There is mention of a 17 mm pulmonary left lung nodule.  No pneumothorax per CAT scan thorax.  -Clinically she feels better though.    -3/20: Chest x-ray still shows left pneumothorax: 10 to 15%.          : Therefore currently chest tube with suction.  : Respiratory status remains stable.     2.  Acute exacerbation COPD. Called earlier by RT who noted Pt had been changed to Prednisone  every day, resumed Solumedrol  q 8h.     -Minimal expiratory wheezing we will continue Solu-Medrol IV every 8 hours at this time .    3.  Acute bronchitis  4.  HTN, uncontrolled: Due to acute dyspnea.       -We will increase losartan to 50 mg and increase diltiazem to 240 mg from         180 mg.  5.  T2DM  6.  Left upper lobe lung nodule, will need continued follow-up   7.  Dyslipidemia  8.  PTSD, she was [place], has had respiratory problems since.  RN notes anxiety @ times. Try prn Hydroxyzine  9.  Peripheral neuropathy  10.  GERD.    11.  DJD  12.  DVT proph w sc Heparin  13.  Dispo: Still probably needs a couple of days  Subjective:   Interval history:      3/19: She is lying in bed and states that she feels much better.  States her breathing is improved.  Appears to be  quite happy with her progress.    Minimal drainage from chest tube.    3/18; she appears to be more comfortable today but obviously and still moderate amount of pain to the chest tube which is to be expected.  Overall respiratory status is improved.  She states that her anxiety is controlled at this time.  She appears quite optimistic as she should be.      3/17: Rapid response team was called because she patient went into acute hypoxic respiratory failure and began to have tach tachycardia and tachypnea the patient had to be placed back on BiPAP at this time.  An ABG chest x-ray will be obtained we will have to escalate her nebulizer treatments.  We will need to move her to the stepdown at this time.  Respiratory is on board.  She does appear to be quite anxious and therefore gave Ativan 0.5 mg IV x1 and will make Ativan 0.5 mg IV every 6 hours as needed as patient does have a history of PTSD she was exposed to respiratory problems since we will treat started on 11.      Cardiovascular ROS: no chest pain ,  orthopnea   Gastrointestinal ROS: no abdominal pain, nausea,vomiting, diarrhea, melena or hematechezia  Genito-Urinary ROS: no dysuria, trouble voiding, or hematuria  Objective:   Physical Exam:     Visit Vitals  BP 140/84 (BP 1 Location: Right arm, BP Patient Position: Supine)   Pulse 86   Temp 97.3 ??F (36.3 ??C)   Resp 18   Ht  (1.727 m)   Wt 93.8 kg (206 lb 11.2 oz)   SpO2 96%   BMI 31.43 kg/m??    O2 Flow Rate (L/min): 2 l/min O2 Device: Nasal cannula    Temp (24hrs), Avg:98 ??F (36.7 ??C), Min:97.3 ??F (36.3 ??C), Max:98.5 ??F (36.9 ??C)    No intake/output data recorded.   03/18 1901 - 03/20 0700  In: 1400 [P.O.:1400]  Out: 3709 [Urine:3700]    General:  Alert, cooperative, no distress, appears stated age.   Lungs:    Still significant exp wheezes    :     Heart:  Regular rate and rhythm, S1, S2 normal, no murmur, click, rub or gallop.   Abdomen:   Soft, non-tender. Bowel sounds normal. No masses,  No organomegaly.   Extremities:  FROM, no LE edema   Pulses: 2+ and symmetric all extremities.   Skin: Skin color, texture, turgor normal. No rashes or lesions   :      Data Review:       24 Hour Results:  Recent Results (from the past 24 hour(s))   GLUCOSE, POC    Collection Time: 08/30/18 12:15 PM   Result Value Ref Range    Glucose (POC) 269 (H) 65 - 105 mg/dL   GLUCOSE, POC    Collection Time: 08/30/18  4:57 PM   Result Value Ref Range    Glucose (POC) 274 (H) 65 - 105 mg/dL   GLUCOSE, POC    Collection Time: 08/30/18  9:35 PM   Result Value Ref Range    Glucose (POC) 199 (H) 65 - 105 mg/dL   RENAL FUNCTION PANEL    Collection Time: 08/31/18  6:16 AM   Result Value Ref Range    Sodium 131 (L) 136 - 145 mEq/L    Potassium 5.1 3.5 - 5.1 mEq/L    Chloride 97 (L) 98 - 107 mEq/L    CO2 26 21 -  32 mEq/L    Glucose 304 (H) 74 - 106 mg/dl    BUN 37 (H) 7 - 25 mg/dl    Creatinine 0.9 0.6 - 1.3 mg/dl    GFR est AA >70.0      GFR est non-AA >60      Calcium 8.6 8.5 - 10.1 mg/dl    Albumin 2.6 (L) 3.4 - 5.0 gm/dl     Phosphorus 2.5 2.5 - 4.9 mg/dl    Anion gap 9 5 - 15 mmol/L   GLUCOSE, POC    Collection Time: 08/31/18  7:56 AM   Result Value Ref Range    Glucose (POC) 286 (H) 65 - 105 mg/dL       Problem List:  Problem List as of 08/31/2018 Date Reviewed: 09/05/18          Codes Class Noted - Resolved    Respiratory failure with hypoxia [facility]) ICD-10-CM: J96.91  ICD-9-CM: 518.81  08/22/2018 - Present        Headache ICD-10-CM: R51  ICD-9-CM: 784.0  08/05/2018 - Present        Abdominal pain ICD-10-CM: R10.9  ICD-9-CM: 789.00  02/04/2018 - Present        SIRS (systemic inflammatory response syndrome) (HCC) ICD-10-CM: R65.10  ICD-9-CM: 995.90  10/09/2017 - Present        Hyperkalemia ICD-10-CM: E87.5  ICD-9-CM: 276.7  09/08/2017 - Present        Obtunded ICD-10-CM: R40.1  ICD-9-CM: 780.09  09/08/2017 - Present        Acute renal failure (ARF) (HCC) ICD-10-CM: N17.9  ICD-9-CM: 584.9  09/08/2017 - Present        Septic shock (HCC) ICD-10-CM: A41.9, R65.21  ICD-9-CM: 038.9, 785.52, 995.92  09/08/2017 - Present        Metabolic encephalopathy ICD-10-CM: G93.41  ICD-9-CM: 348.31  09/08/2017 - Present        Dehydration ICD-10-CM: E86.0  ICD-9-CM: 276.51  08/07/2017 - Present        COPD with acute exacerbation (HCC) ICD-10-CM: J44.1  ICD-9-CM: 491.21  08/07/2017 - Present        Acute-on-chronic kidney injury (HCC) ICD-10-CM: N17.9, N18.9  ICD-9-CM: 584.9, 585.9  08/07/2017 - Present        Chest pain ICD-10-CM: R07.9  ICD-9-CM: 786.50  06/15/2017 - Present        Dyspnea ICD-10-CM: R06.00  ICD-9-CM: 786.09  06/15/2017 - Present        Type 2 diabetes mellitus with diabetic neuropathy (HCC) ICD-10-CM: E11.40  ICD-9-CM: 250.60, 357.2  12/07/2016 - Present        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  ICD-9-CM: 569.89  08/17/2016 - Present        Cannabinoid hyperemesis syndrome (HCC) ICD-10-CM: F12.988  ICD-9-CM: 536.2, 305.20  08/17/2016 - Present        Gastritis ICD-10-CM: K29.70  ICD-9-CM: 535.50  08/16/2016 - Present         Nausea & vomiting ICD-10-CM: R11.2  ICD-9-CM: 787.01  08/16/2016 - Present        Asthma with acute exacerbation ICD-10-CM: J45.901  ICD-9-CM: 493.92  08/04/2016 - Present        Lactic acidosis ICD-10-CM: E87.2  ICD-9-CM: 276.2  07/24/2016 - Present        Leukocytosis ICD-10-CM: D72.829  ICD-9-CM: 288.60  07/24/2016 - Present        Sepsis (HCC) ICD-10-CM: A41.9  ICD-9-CM: 038.9, 995.91  07/24/2016 - Present        Asthma exacerbation ICD-10-CM: J45.901  ICD-9-CM: 493.92  07/24/2016 - Present        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: E11.21  ICD-9-CM: 250.40, 583.81  06/12/2016 - Present        Sacroiliitis (HCC) ICD-10-CM: M46.1  ICD-9-CM: 720.2  11/25/2015 - Present        Essential hypertension ICD-10-CM: I10  ICD-9-CM: 401.9  10/06/2015 - Present        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  ICD-9-CM: 721.3  09/15/2015 - Present        Lumbar and sacral osteoarthritis ICD-10-CM: M47.817  ICD-9-CM: 721.3  09/15/2015 - Present        Chronic pain syndrome ICD-10-CM: G89.4  ICD-9-CM: 338.4  09/15/2015 - Present        Type 2 diabetes mellitus with hyperglycemia, without long-term current use of insulin (HCC) ICD-10-CM: E11.65  ICD-9-CM: 250.00, 790.29  08/20/2015 - Present        Acute colitis ICD-10-CM: K52.9  ICD-9-CM: 558.9  07/02/2015 - Present        Acute hyperglycemia ICD-10-CM: R73.9  ICD-9-CM: 790.29  07/02/2015 - Present        Accelerated hypertension ICD-10-CM: I10  ICD-9-CM: 401.0  04/08/2015 - Present        Severe headache ICD-10-CM: R51  ICD-9-CM: 784.0  04/07/2015 - Present        Osteoarthritis of hips, 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  ICD-9-CM: 715.95  01/29/2015 - Present        PTSD (post-traumatic stress disorder) (Chronic) ICD-10-CM: F43.10  ICD-9-CM: 309.81  01/29/2015 - Present        Severe hypertension (Chronic) ICD-10-CM: I10  ICD-9-CM: 401.9  07/21/2014 - Present        RESOLVED: COPD exacerbation (HCC) ICD-10-CM: J44.1   ICD-9-CM: 491.21  11/14/2017 - 08/08/2018        RESOLVED: New onset type 1 diabetes mellitus, uncontrolled (HCC) ICD-10-CM: E10.65  ICD-9-CM: 250.03  07/02/2015 - 10/06/2015        RESOLVED: Recurrent major depressive disorder, in full remission (HCC) ICD-10-CM: F33.42  ICD-9-CM: 296.36  12/02/2014 - 12/02/2014        RESOLVED: Foreign body in colon ICD-10-CM: T18.4XXA  ICD-9-CM: 936  08/14/2014 - 01/29/2015        RESOLVED: Advanced care planning/counseling discussion ICD-10-CM: Z71.89  ICD-9-CM: V65.49  07/21/2014 - 01/29/2015    Overview Signed 07/21/2014  3:46 PM by Littie Deeds     Patient given [place] application               RESOLVED: Hip pain, chronic ICD-10-CM: M25.559, G89.29  ICD-9-CM: 719.45, 338.29  07/21/2014 - 01/29/2015               Medications reviewed  Current Facility-Administered Medications   Medication Dose Route Frequency   ??? dilTIAZem CD (CARDIZEM CD) capsule 240 mg  240 mg Oral DAILY   ??? losartan (COZAAR) tablet 50 mg  50 mg Oral DAILY   ??? polyethylene glycol (MIRALAX) packet 17 g  17 g Oral BID PRN   ??? bisacodyL (DULCOLAX) suppository 10 mg  10 mg Rectal DAILY PRN   ??? azithromycin (ZITHROMAX) tablet 500 mg  500 mg Oral DAILY   ??? methylPREDNISolone (PF) (SOLU-MEDROL) injection 40 mg  40 mg IntraVENous Q6H   ??? albuterol-ipratropium (DUO-NEB) 2.5 MG-0.5 MG/3 ML  3 mL Nebulization Q4H RT   ??? guaiFENesin ER (MUCINEX) tablet 1,200 mg  1,200 mg Oral Q12H   ??? LORazepam (ATIVAN)  injection 0.5 mg  0.5 mg IntraVENous Q6H PRN   ??? cetirizine (ZYRTEC) tablet 10 mg  10 mg Oral DAILY   ??? fluticasone propionate (FLONASE) 50 mcg/actuation nasal spray 2 Spray  2 Spray Both Nostrils BID   ??? acetylcysteine (MUCOMYST) 100 mg/mL (10 %) nebulizer solution 400 mg  4 mL Nebulization Q8H RT   ??? HYDROcodone-acetaminophen (NORCO) 5-325 mg per tablet 1-2 Tab  1-2 Tab Oral Q4H PRN   ??? HYDROmorphone (DILAUDID) injection 0.5-1 mg  0.5-1 mg IntraVENous Q3H PRN   ??? docusate sodium (COLACE) capsule 100 mg  100 mg Oral BID    ??? hydrOXYzine HCL (ATARAX) tablet 25 mg  25 mg Oral TID PRN   ??? cloNIDine HCL (CATAPRES) tablet 0.3 mg  0.3 mg Oral BID   ??? QUEtiapine SR (SEROquel XR) tablet 150 mg  150 mg Oral QHS   ??? revefenacin (YUPELRI) nebulizer solution 175 mcg  175 mcg Nebulization DAILY   ??? dextromethorphan (DELSYM) 30 mg/5 mL syrup 30 mg  30 mg Oral Q12H PRN   ??? gabapentin (NEURONTIN) capsule 300 mg  300 mg Oral TID   ??? montelukast (SINGULAIR) tablet 10 mg  10 mg Oral QHS   ??? naloxone (NARCAN) injection 0.1 mg  0.1 mg IntraVENous PRN   ??? acetaminophen (TYLENOL) tablet 650 mg  650 mg Oral Q4H PRN    Or   ??? acetaminophen (TYLENOL) solution 650 mg  650 mg Oral Q4H PRN    Or   ??? acetaminophen (TYLENOL) suppository 650 mg  650 mg Rectal Q4H PRN   ??? HYDROcodone-acetaminophen (NORCO) 5-325 mg per tablet 1 Tab  1 Tab Oral Q4H PRN   ??? ondansetron (ZOFRAN) injection 4 mg  4 mg IntraVENous Q4H PRN   ??? heparin (porcine) injection 5,000 Units  5,000 Units SubCUTAneous Q8H   ??? arformoteroL (BROVANA) neb solution 15 mcg  15 mcg Nebulization BID RT   ??? budesonide (PULMICORT) 500 mcg/2 ml nebulizer suspension  500 mcg Nebulization BID RT   ??? dextrose (D50W) injection syrg 5-25 g  10-50 mL IntraVENous PRN   ??? glucagon (GLUCAGEN) injection 1 mg  1 mg IntraMUSCular PRN   ??? insulin glargine (LANTUS) injection 1-100 Units  1-100 Units SubCUTAneous QHS   ??? insulin lispro (HUMALOG) injection 1-100 Units  1-100 Units SubCUTAneous AC&HS   ??? insulin lispro (HUMALOG) injection 1-100 Units  1-100 Units SubCUTAneous PRN        Care Plan discussed with: pt     Total time spent with patient: 25 minutes.    Darrin Nipper, MD  August 31, 2018  1045

## 2018-08-31 NOTE — Progress Notes (Signed)
No complaint.  No shortness of breath.  Chest tube placed to waterseal yesterday.  CT scan prior to that showed no evidence of pneumothorax.  Unfortunately chest film today suggests a new 10 to 15% pneumothorax on the left.  Similar but not as severe as the day the chest tube went in.  Will return chest tube to suction.  Repeat chest film in the morning.  Situation explained and discussed in detail with the patient.  All questions answered.

## 2018-08-31 NOTE — Progress Notes (Signed)
No complaint.  No shortness of breath.  Chest tube placed to waterseal yesterday.  CT scan prior to that showed no evidence of pneumothorax.  Unfortunately chest film today suggests a new 10 to 15% pneumothorax on the left.  Similar but not as severe as the day the chest tube went in.  Will return chest tube to suction.  Repeat chest film in the morning.  Situation explained and discussed in detail with the patient.  All questions answered.

## 2018-08-31 NOTE — Progress Notes (Signed)
Hospitalist Progress Note         [facility]    Daily Progress Note: 08/31/2018    Assessment/Plan:   1.  Acute hypoxic respiratory failure: she had an RRT called on her this morning because she went into acute hypoxic respiratory failure tachycardia and tachypnea.  She appeared to be quite tight with only minimal wheezing at the bases but she appeared to be quite anxious.  -We will need to transfer her to stepdown at this time I will increase her Solu-Medrol to-25 mg IV x1 and restart Solu-Medrol 60 mg IV every 6 hours I have increased her DuoNeb to every 4 hours and make albuterol nebulizer every 2 hours and started Mucomyst 40 mg every 6 hours and Mucinex 1200 mg twice daily as the patient does have a seasonal allergy component.  -Started Ativan 0.5 mg IV every 6 hours as needed for anxiety as the patient does have PTSD.  -We will check a chest x-ray.  -I discussed with nursing staff respiratory therapy and stepdown the patient will need to be transferred to stepdown at this time.  -We will check an ABG given her acute hypoxic change but the patient is alert and oriented x3 and mentating very well.  - will check a chest xray to make certain there isnt an infiltrate contributing to her distress.  - on 3/18; left-sided chest tube placed on 317 after chest x-ray was obtained.              -Appreciate Dr. Assunta Found assistance.              -Chest tube has been minimal bloody drainage at this time. -40 cm of suction.  Chest tube without air leak.            -Chest x-ray does show improvement but still small apical pneumothorax.      -3/19; CT of thorax performed shows bullae and blebs.  There is mention of a 17 mm pulmonary left lung nodule.  No pneumothorax per CAT scan thorax.  -Clinically she feels better though.    -3/20: Chest x-ray still shows left pneumothorax: 10 to 15%.          : Therefore currently chest tube with suction.  : Respiratory status remains stable.    2.  Acute exacerbation COPD. Called  earlier by RT who noted Pt had been changed to Prednisone  every day, resumed Solumedrol  q 8h.     -Minimal expiratory wheezing we will continue Solu-Medrol IV every 8 hours at this time .    3.  Acute bronchitis  4.  HTN, uncontrolled: Due to acute dyspnea.       -We will increase losartan to 50 mg and increase diltiazem to 240 mg from         180 mg.  5.  T2DM  6.  Left upper lobe lung nodule, will need continued follow-up   7.  Dyslipidemia  8.  PTSD, she was [place], has had respiratory problems since.  RN notes anxiety @ times. Try prn Hydroxyzine  9.  Peripheral neuropathy  10.  GERD.    11.  DJD  12.  DVT proph w sc Heparin  13.  Dispo: Still probably needs a couple of days  Subjective:   Interval history:      3/19: She is lying in bed and states that she feels much better.  States her breathing is improved.  Appears to be  quite happy with her progress.    Minimal drainage from chest tube.    3/18; she appears to be more comfortable today but obviously and still moderate amount of pain to the chest tube which is to be expected.  Overall respiratory status is improved.  She states that her anxiety is controlled at this time.  She appears quite optimistic as she should be.      3/17: Rapid response team was called because she patient went into acute hypoxic respiratory failure and began to have tach tachycardia and tachypnea the patient had to be placed back on BiPAP at this time.  An ABG chest x-ray will be obtained we will have to escalate her nebulizer treatments.  We will need to move her to the stepdown at this time.  Respiratory is on board.  She does appear to be quite anxious and therefore gave Ativan 0.5 mg IV x1 and will make Ativan 0.5 mg IV every 6 hours as needed as patient does have a history of PTSD she was exposed to respiratory problems since we will treat started on 11.      Cardiovascular ROS: no chest pain ,  orthopnea  Gastrointestinal ROS: no abdominal pain,  nausea,vomiting, diarrhea, melena or hematechezia  Genito-Urinary ROS: no dysuria, trouble voiding, or hematuria  Objective:   Physical Exam:     Visit Vitals  BP 140/84 (BP 1 Location: Right arm, BP Patient Position: Supine)   Pulse 86   Temp 97.3 ??F (36.3 ??C)   Resp 18   Ht 5\' 8"  (1.727 m)   Wt 93.8 kg (206 lb 11.2 oz)   SpO2 96%   BMI 31.43 kg/m??    O2 Flow Rate (L/min): 2 l/min O2 Device: Nasal cannula    Temp (24hrs), Avg:98 ??F (36.7 ??C), Min:97.3 ??F (36.3 ??C), Max:98.5 ??F (36.9 ??C)    No intake/output data recorded.   03/18 1901 - 03/20 0700  In: 1400 [P.O.:1400]  Out: 3709 [Urine:3700]    General:  Alert, cooperative, no distress, appears stated age.   Lungs:    Still significant exp wheezes    :     Heart:  Regular rate and rhythm, S1, S2 normal, no murmur, click, rub or gallop.   Abdomen:   Soft, non-tender. Bowel sounds normal. No masses,  No organomegaly.   Extremities:  FROM, no LE edema   Pulses: 2+ and symmetric all extremities.   Skin: Skin color, texture, turgor normal. No rashes or lesions   :      Data Review:       24 Hour Results:  Recent Results (from the past 24 hour(s))   GLUCOSE, POC    Collection Time: 08/30/18 12:15 PM   Result Value Ref Range    Glucose (POC) 269 (H) 65 - 105 mg/dL   GLUCOSE, POC    Collection Time: 08/30/18  4:57 PM   Result Value Ref Range    Glucose (POC) 274 (H) 65 - 105 mg/dL   GLUCOSE, POC    Collection Time: 08/30/18  9:35 PM   Result Value Ref Range    Glucose (POC) 199 (H) 65 - 105 mg/dL   RENAL FUNCTION PANEL    Collection Time: 08/31/18  6:16 AM   Result Value Ref Range    Sodium 131 (L) 136 - 145 mEq/L    Potassium 5.1 3.5 - 5.1 mEq/L    Chloride 97 (L) 98 - 107 mEq/L    CO2 26 21 -  32 mEq/L    Glucose 304 (H) 74 - 106 mg/dl    BUN 37 (H) 7 - 25 mg/dl    Creatinine 0.9 0.6 - 1.3 mg/dl    GFR est AA >80.9      GFR est non-AA >60      Calcium 8.6 8.5 - 10.1 mg/dl    Albumin 2.6 (L) 3.4 - 5.0 gm/dl    Phosphorus 2.5 2.5 - 4.9 mg/dl    Anion gap 9 5 - 15 mmol/L    GLUCOSE, POC    Collection Time: 08/31/18  7:56 AM   Result Value Ref Range    Glucose (POC) 286 (H) 65 - 105 mg/dL       Problem List:  Problem List as of 08/31/2018 Date Reviewed: 2018/08/16          Codes Class Noted - Resolved    Respiratory failure with hypoxia [facility]) ICD-10-CM: J96.91  ICD-9-CM: 518.81  08/22/2018 - Present        Headache ICD-10-CM: R51  ICD-9-CM: 784.0  08/05/2018 - Present        Abdominal pain ICD-10-CM: R10.9  ICD-9-CM: 789.00  02/04/2018 - Present        SIRS (systemic inflammatory response syndrome) (HCC) ICD-10-CM: R65.10  ICD-9-CM: 995.90  10/09/2017 - Present        Hyperkalemia ICD-10-CM: E87.5  ICD-9-CM: 276.7  09/08/2017 - Present        Obtunded ICD-10-CM: R40.1  ICD-9-CM: 780.09  09/08/2017 - Present        Acute renal failure (ARF) (HCC) ICD-10-CM: N17.9  ICD-9-CM: 584.9  09/08/2017 - Present        Septic shock (HCC) ICD-10-CM: A41.9, R65.21  ICD-9-CM: 038.9, 785.52, 995.92  09/08/2017 - Present        Metabolic encephalopathy ICD-10-CM: G93.41  ICD-9-CM: 348.31  09/08/2017 - Present        Dehydration ICD-10-CM: E86.0  ICD-9-CM: 276.51  08/07/2017 - Present        COPD with acute exacerbation (HCC) ICD-10-CM: J44.1  ICD-9-CM: 491.21  08/07/2017 - Present        Acute-on-chronic kidney injury (HCC) ICD-10-CM: N17.9, N18.9  ICD-9-CM: 584.9, 585.9  08/07/2017 - Present        Chest pain ICD-10-CM: R07.9  ICD-9-CM: 786.50  06/15/2017 - Present        Dyspnea ICD-10-CM: R06.00  ICD-9-CM: 786.09  06/15/2017 - Present        Type 2 diabetes mellitus with diabetic neuropathy (HCC) ICD-10-CM: E11.40  ICD-9-CM: 250.60, 357.2  12/07/2016 - Present        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  ICD-9-CM: 569.89  08/17/2016 - Present        Cannabinoid hyperemesis syndrome (HCC) ICD-10-CM: F12.988  ICD-9-CM: 536.2, 305.20  08/17/2016 - Present        Gastritis ICD-10-CM: K29.70  ICD-9-CM: 535.50  08/16/2016 - Present        Nausea & vomiting ICD-10-CM: R11.2  ICD-9-CM: 787.01  08/16/2016 - Present        Asthma  with acute exacerbation ICD-10-CM: J45.901  ICD-9-CM: 493.92  08/04/2016 - Present        Lactic acidosis ICD-10-CM: E87.2  ICD-9-CM: 276.2  07/24/2016 - Present        Leukocytosis ICD-10-CM: D72.829  ICD-9-CM: 288.60  07/24/2016 - Present        Sepsis (HCC) ICD-10-CM: A41.9  ICD-9-CM: 038.9, 995.91  07/24/2016 - Present        Asthma exacerbation ICD-10-CM: J45.901  ICD-9-CM: 493.92  07/24/2016 - Present        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: E11.21  ICD-9-CM: 250.40, 583.81  06/12/2016 - Present        Sacroiliitis (HCC) ICD-10-CM: M46.1  ICD-9-CM: 720.2  11/25/2015 - Present        Essential hypertension ICD-10-CM: I10  ICD-9-CM: 401.9  10/06/2015 - Present        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  ICD-9-CM: 721.3  09/15/2015 - Present        Lumbar and sacral osteoarthritis ICD-10-CM: M47.817  ICD-9-CM: 721.3  09/15/2015 - Present        Chronic pain syndrome ICD-10-CM: G89.4  ICD-9-CM: 338.4  09/15/2015 - Present        Type 2 diabetes mellitus with hyperglycemia, without long-term current use of insulin (HCC) ICD-10-CM: E11.65  ICD-9-CM: 250.00, 790.29  08/20/2015 - Present        Acute colitis ICD-10-CM: K52.9  ICD-9-CM: 558.9  07/02/2015 - Present        Acute hyperglycemia ICD-10-CM: R73.9  ICD-9-CM: 790.29  07/02/2015 - Present        Accelerated hypertension ICD-10-CM: I10  ICD-9-CM: 401.0  04/08/2015 - Present        Severe headache ICD-10-CM: R51  ICD-9-CM: 784.0  04/07/2015 - Present        Osteoarthritis of hips, 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  ICD-9-CM: 715.95  01/29/2015 - Present        PTSD (post-traumatic stress disorder) (Chronic) ICD-10-CM: F43.10  ICD-9-CM: 309.81  01/29/2015 - Present        Severe hypertension (Chronic) ICD-10-CM: I10  ICD-9-CM: 401.9  07/21/2014 - Present        RESOLVED: COPD exacerbation (HCC) ICD-10-CM: J44.1  ICD-9-CM: 491.21  11/14/2017 - 08/08/2018        RESOLVED: New onset type 1 diabetes mellitus, uncontrolled  (HCC) ICD-10-CM: E10.65  ICD-9-CM: 250.03  07/02/2015 - 10/06/2015        RESOLVED: Recurrent major depressive disorder, in full remission (HCC) ICD-10-CM: F33.42  ICD-9-CM: 296.36  12/02/2014 - 12/02/2014        RESOLVED: Foreign body in colon ICD-10-CM: T18.4XXA  ICD-9-CM: 936  08/14/2014 - 01/29/2015        RESOLVED: Advanced care planning/counseling discussion ICD-10-CM: Z71.89  ICD-9-CM: V65.49  07/21/2014 - 01/29/2015    Overview Signed 07/21/2014  3:46 PM by Littie Deeds     Patient given [place] application               RESOLVED: Hip pain, chronic ICD-10-CM: M25.559, G89.29  ICD-9-CM: 719.45, 338.29  07/21/2014 - 01/29/2015               Medications reviewed  Current Facility-Administered Medications   Medication Dose Route Frequency   ??? dilTIAZem CD (CARDIZEM CD) capsule 240 mg  240 mg Oral DAILY   ??? losartan (COZAAR) tablet 50 mg  50 mg Oral DAILY   ??? polyethylene glycol (MIRALAX) packet 17 g  17 g Oral BID PRN   ??? bisacodyL (DULCOLAX) suppository 10 mg  10 mg Rectal DAILY PRN   ??? azithromycin (ZITHROMAX) tablet 500 mg  500 mg Oral DAILY   ??? methylPREDNISolone (PF) (SOLU-MEDROL) injection 40 mg  40 mg IntraVENous Q6H   ??? albuterol-ipratropium (DUO-NEB) 2.5 MG-0.5 MG/3 ML  3 mL Nebulization Q4H RT   ??? guaiFENesin ER (MUCINEX) tablet 1,200 mg  1,200 mg Oral Q12H   ??? LORazepam (ATIVAN)  injection 0.5 mg  0.5 mg IntraVENous Q6H PRN   ??? cetirizine (ZYRTEC) tablet 10 mg  10 mg Oral DAILY   ??? fluticasone propionate (FLONASE) 50 mcg/actuation nasal spray 2 Spray  2 Spray Both Nostrils BID   ??? acetylcysteine (MUCOMYST) 100 mg/mL (10 %) nebulizer solution 400 mg  4 mL Nebulization Q8H RT   ??? HYDROcodone-acetaminophen (NORCO) 5-325 mg per tablet 1-2 Tab  1-2 Tab Oral Q4H PRN   ??? HYDROmorphone (DILAUDID) injection 0.5-1 mg  0.5-1 mg IntraVENous Q3H PRN   ??? docusate sodium (COLACE) capsule 100 mg  100 mg Oral BID   ??? hydrOXYzine HCL (ATARAX) tablet 25 mg  25 mg Oral TID PRN   ??? cloNIDine HCL (CATAPRES) tablet 0.3 mg   0.3 mg Oral BID   ??? QUEtiapine SR (SEROquel XR) tablet 150 mg  150 mg Oral QHS   ??? revefenacin (YUPELRI) nebulizer solution 175 mcg  175 mcg Nebulization DAILY   ??? dextromethorphan (DELSYM) 30 mg/5 mL syrup 30 mg  30 mg Oral Q12H PRN   ??? gabapentin (NEURONTIN) capsule 300 mg  300 mg Oral TID   ??? montelukast (SINGULAIR) tablet 10 mg  10 mg Oral QHS   ??? naloxone (NARCAN) injection 0.1 mg  0.1 mg IntraVENous PRN   ??? acetaminophen (TYLENOL) tablet 650 mg  650 mg Oral Q4H PRN    Or   ??? acetaminophen (TYLENOL) solution 650 mg  650 mg Oral Q4H PRN    Or   ??? acetaminophen (TYLENOL) suppository 650 mg  650 mg Rectal Q4H PRN   ??? HYDROcodone-acetaminophen (NORCO) 5-325 mg per tablet 1 Tab  1 Tab Oral Q4H PRN   ??? ondansetron (ZOFRAN) injection 4 mg  4 mg IntraVENous Q4H PRN   ??? heparin (porcine) injection 5,000 Units  5,000 Units SubCUTAneous Q8H   ??? arformoteroL (BROVANA) neb solution 15 mcg  15 mcg Nebulization BID RT   ??? budesonide (PULMICORT) 500 mcg/2 ml nebulizer suspension  500 mcg Nebulization BID RT   ??? dextrose (D50W) injection syrg 5-25 g  10-50 mL IntraVENous PRN   ??? glucagon (GLUCAGEN) injection 1 mg  1 mg IntraMUSCular PRN   ??? insulin glargine (LANTUS) injection 1-100 Units  1-100 Units SubCUTAneous QHS   ??? insulin lispro (HUMALOG) injection 1-100 Units  1-100 Units SubCUTAneous AC&HS   ??? insulin lispro (HUMALOG) injection 1-100 Units  1-100 Units SubCUTAneous PRN        Care Plan discussed with: pt     Total time spent with patient: 25 minutes.    Darrin Nipper, MD  August 31, 2018  1045

## 2018-09-01 ENCOUNTER — Inpatient Hospital Stay: Admit: 2018-09-01 | Payer: MEDICARE | Primary: Physician Assistant

## 2018-09-01 LAB — RENAL FUNCTION PANEL
Albumin: 2.8 gm/dl — ABNORMAL LOW (ref 3.4–5.0)
Albumin: 2.8 gm/dl — ABNORMAL LOW (ref 3.4–5.0)
Anion Gap: 9 mmol/L (ref 5–15)
Anion gap: 9 mmol/L (ref 5–15)
BUN: 40 mg/dl — ABNORMAL HIGH (ref 7–25)
BUN: 40 mg/dl — ABNORMAL HIGH (ref 7–25)
CO2: 24 mEq/L (ref 21–32)
CO2: 24 mEq/L (ref 21–32)
Calcium: 8.3 mg/dl — ABNORMAL LOW (ref 8.5–10.1)
Calcium: 8.3 mg/dl — ABNORMAL LOW (ref 8.5–10.1)
Chloride: 97 mEq/L — ABNORMAL LOW (ref 98–107)
Chloride: 97 mEq/L — ABNORMAL LOW (ref 98–107)
Creatinine: 1 mg/dl (ref 0.6–1.3)
Creatinine: 1 mg/dl (ref 0.6–1.3)
EGFR IF NonAfrican American: 59
GFR African American: >60
GFR est AA: >60
GFR est non-AA: 59
Glucose: 385 mg/dl — ABNORMAL HIGH (ref 74–106)
Glucose: 385 mg/dl — ABNORMAL HIGH (ref 74–106)
Phosphorus: 2.8 mg/dl (ref 2.5–4.9)
Phosphorus: 2.8 mg/dl (ref 2.5–4.9)
Potassium: 5.2 mEq/L — ABNORMAL HIGH (ref 3.5–5.1)
Potassium: 5.2 mEq/L — ABNORMAL HIGH (ref 3.5–5.1)
Sodium: 130 mEq/L — ABNORMAL LOW (ref 136–145)
Sodium: 130 mEq/L — ABNORMAL LOW (ref 136–145)

## 2018-09-01 LAB — POCT GLUCOSE
POC Glucose: 261 mg/dL — ABNORMAL HIGH (ref 65–105)
POC Glucose: 259 mg/dL — ABNORMAL HIGH (ref 65–105)
POC Glucose: 356 mg/dL — ABNORMAL HIGH (ref 65–105)
POC Glucose: 248 mg/dL — ABNORMAL HIGH (ref 65–105)
POC Glucose: 270 mg/dL — ABNORMAL HIGH (ref 65–105)

## 2018-09-01 LAB — GLUCOSE, POC
Glucose (POC): 261 mg/dL — ABNORMAL HIGH (ref 65–105)
Glucose (POC): 259 mg/dL — ABNORMAL HIGH (ref 65–105)
Glucose (POC): 356 mg/dL — ABNORMAL HIGH (ref 65–105)
Glucose (POC): 248 mg/dL — ABNORMAL HIGH (ref 65–105)
Glucose (POC): 270 mg/dL — ABNORMAL HIGH (ref 65–105)

## 2018-09-01 MED ORDER — ACETYLCYSTEINE 10 % (100 MG/ML) SOLN
100 mg/mL (10 %) | Freq: Four times a day (QID) | Status: DC
Start: 2018-09-01 — End: 2018-09-01
  Administered 2018-09-01 – 2018-09-02 (×4): via RESPIRATORY_TRACT

## 2018-09-01 MED ORDER — PREDNISONE 20 MG TAB
20 mg | Freq: Every day | ORAL | Status: DC
Start: 2018-09-01 — End: 2018-09-06
  Administered 2018-09-02 – 2018-09-06 (×5): via ORAL

## 2018-09-01 MED ORDER — IPRATROPIUM-ALBUTEROL 2.5 MG-0.5 MG/3 ML NEB SOLUTION
2.5 mg-0.5 mg/3 ml | Freq: Four times a day (QID) | RESPIRATORY_TRACT | Status: DC
Start: 2018-09-01 — End: 2018-09-07
  Administered 2018-09-01 – 2018-09-07 (×23): via RESPIRATORY_TRACT

## 2018-09-01 MED ORDER — PATIROMER CALCIUM SORBITEX 8.4 GRAM ORAL POWDER PACKET
8.4 gram | Freq: Every day | ORAL | Status: DC
Start: 2018-09-01 — End: 2018-09-03
  Administered 2018-09-02 – 2018-09-03 (×2): via ORAL

## 2018-09-01 MED FILL — HYDROMORPHONE 1 MG/ML INJECTION SOLUTION: 1 mg/mL | INTRAMUSCULAR | Qty: 1

## 2018-09-01 MED FILL — DILTIAZEM ER 120 MG 24 HR CAP: 120 mg | ORAL | Qty: 2

## 2018-09-01 MED FILL — SOLU-MEDROL (PF) 40 MG/ML SOLUTION FOR INJECTION: 40 mg/mL | INTRAMUSCULAR | Qty: 1

## 2018-09-01 MED FILL — IPRATROPIUM-ALBUTEROL 2.5 MG-0.5 MG/3 ML NEB SOLUTION: 2.5 mg-0.5 mg/3 ml | RESPIRATORY_TRACT | Qty: 3

## 2018-09-01 MED FILL — ACETYLCYSTEINE 10 % (100 MG/ML) SOLN: 100 mg/mL (10 %) | Qty: 4

## 2018-09-01 MED FILL — HEPARIN (PORCINE) 5,000 UNIT/ML IJ SOLN: 5000 unit/mL | INTRAMUSCULAR | Qty: 1

## 2018-09-01 MED FILL — YUPELRI 175 MCG/3 ML SOLUTION FOR NEBULIZATION: 175 mcg/3 mL | RESPIRATORY_TRACT | Qty: 1

## 2018-09-01 MED FILL — BUDESONIDE 0.5 MG/2 ML NEB SUSPENSION: 0.5 mg/2 mL | RESPIRATORY_TRACT | Qty: 1

## 2018-09-01 MED FILL — CLONIDINE 0.1 MG TAB: 0.1 mg | ORAL | Qty: 3

## 2018-09-01 MED FILL — BROVANA 15 MCG/2 ML SOLUTION FOR NEBULIZATION: 15 mcg/2 mL | RESPIRATORY_TRACT | Qty: 1

## 2018-09-01 MED FILL — LOSARTAN 50 MG TAB: 50 mg | ORAL | Qty: 1

## 2018-09-01 MED FILL — MUCINEX 600 MG TABLET, EXTENDED RELEASE: 600 mg | ORAL | Qty: 2

## 2018-09-01 MED FILL — AZITHROMYCIN 250 MG TAB: 250 mg | ORAL | Qty: 2

## 2018-09-01 MED FILL — DOCUSATE SODIUM 100 MG CAP: 100 mg | ORAL | Qty: 1

## 2018-09-01 MED FILL — MONTELUKAST 10 MG TAB: 10 mg | ORAL | Qty: 1

## 2018-09-01 MED FILL — GABAPENTIN 300 MG CAP: 300 mg | ORAL | Qty: 1

## 2018-09-01 MED FILL — CETIRIZINE 5 MG TAB: 5 mg | ORAL | Qty: 2

## 2018-09-01 MED FILL — QUETIAPINE SR 50 MG 24 HR TAB: 50 mg | ORAL | Qty: 3

## 2018-09-01 NOTE — Progress Notes (Signed)
Feels well. No obvious air leak. No obvious PTX on this am CXR. Will leave on suction for now.

## 2018-09-01 NOTE — Other (Signed)
Bedside shift change report given to Sonia Rogers RN (oncoming nurse) by Nora Mabanglo, RN  (offgoing nurse). Report included the following information SBAR.

## 2018-09-01 NOTE — Progress Notes (Addendum)
Hospitalist Progress Note         [facility]    Daily Progress Note: 09/01/2018    Assessment/Plan:   1.  Acute hypoxic respiratory failure:     3/18: she had an RRT called on her this morning because she went into acute hypoxic respiratory failure tachycardia and tachypnea.  She appeared to be quite tight with only minimal wheezing at the bases but she appeared to be quite anxious.  -We will need to transfer her to stepdown at this time I will increase her Solu-Medrol to-25 mg IV x1 and restart Solu-Medrol 60 mg IV every 6 hours I have increased her DuoNeb to every 4 hours and make albuterol nebulizer every 2 hours and started Mucomyst 40 mg every 6 hours and Mucinex 1200 mg twice daily as the patient does have a seasonal allergy component.  -Started Ativan 0.5 mg IV every 6 hours as needed for anxiety as the patient does have PTSD.  -We will check a chest x-ray.  -I discussed with nursing staff respiratory therapy and stepdown the patient will need to be transferred to stepdown at this time.  -We will check an ABG given her acute hypoxic change but the patient is alert and oriented x3 and mentating very well.  - will check a chest xray to make certain there isnt an infiltrate contributing to her distress.  - on 3/18; left-sided chest tube placed on 317 after chest x-ray was obtained.              -Appreciate Dr. Assunta Found assistance.              -Chest tube has been minimal bloody drainage at this time. -40 cm of suction.  Chest tube without air leak.            -Chest x-ray does show improvement but still small apical pneumothorax.      -3/19; CT of thorax performed shows bullae and blebs.  There is mention of a 17 mm pulmonary left lung nodule.  No pneumothorax per CAT scan thorax.  -Clinically she feels better though.    -3/20: Chest x-ray still shows left pneumothorax: 10 to 15%.          : Therefore currently chest tube with suction.  : Respiratory status remains stable.     - 3/21: Chest x-ray this morning showed no air leak no pneumothorax.              We will leave on suction per cardiothoracic surgery.              We will de-escalate Solu-Medrol to prednisone 40 mg once a day.  - 3/22: appears to be improving. Cxr: shows no obvious air leak, no obvious               PTX. Chest tube on suction.    2.  Acute exacerbation COPD. Called earlier by RT who noted Pt had been changed to Prednisone 40mg  every day, resumed Solumedrol 60mg  q 8h.     -Minimal expiratory wheezing we will continue Solu-Medrol IV every 8 hours at this time .  -No wheezing at this time will de-escalate to prednisone 40 mg at this time.    3.  Acute bronchitis  4.  HTN, uncontrolled: Due to acute dyspnea.       -We will increase losartan to 50 mg and increase diltiazem to 240 mg from         180 mg.  5.  T2DM  6.  Left upper lobe lung nodule, will need continued follow-up   7.  Dyslipidemia  8.  PTSD, she was [place], has had respiratory problems since.  RN notes anxiety @ times. Try prn Hydroxyzine  9.  Peripheral neuropathy  10.  GERD.    11.  DJD  12.  DVT proph w sc Heparin  13.  Dispo: Still probably needs a couple of days  Subjective:   Interval history:    3/21: She appears comfortable and in no respiratory distress at this time.    3/20 Doing well . Only mild pleuritic chest pain.    3/19: She is lying in bed and states that she feels much better.  States her breathing is improved.  Appears to be quite happy with her progress.    Minimal drainage from chest tube.    3/18; she appears to be more comfortable today but obviously and still moderate amount of pain to the chest tube which is to be expected.  Overall respiratory status is improved.  She states that her anxiety is controlled at this time.  She appears quite optimistic as she should be.      3/17: Rapid response team was called because she patient went into acute  hypoxic respiratory failure and began to have tach tachycardia and tachypnea the patient had to be placed back on BiPAP at this time.  An ABG chest x-ray will be obtained we will have to escalate her nebulizer treatments.  We will need to move her to the stepdown at this time.  Respiratory is on board.  She does appear to be quite anxious and therefore gave Ativan 0.5 mg IV x1 and will make Ativan 0.5 mg IV every 6 hours as needed as patient does have a history of PTSD she was exposed to respiratory problems since we will treat started on 11.      Cardiovascular ROS: no chest pain ,  orthopnea  Gastrointestinal ROS: no abdominal pain, nausea,vomiting, diarrhea, melena or hematechezia  Genito-Urinary ROS: no dysuria, trouble voiding, or hematuria  Objective:   Physical Exam:     Visit Vitals  BP (!) 133/94 (BP 1 Location: Right arm, BP Patient Position: Supine)   Pulse 84   Temp 98.4 ??F (36.9 ??C)   Resp 20   Ht  (1.727 m)   Wt 93.8 kg (206 lb 11.2 oz)   SpO2 98%   BMI 31.43 kg/m??    O2 Flow Rate (L/min): 2 l/min O2 Device: Nasal cannula    Temp (24hrs), Avg:98.2 ??F (36.8 ??C), Min:97.9 ??F (36.6 ??C), Max:98.7 ??F (37.1 ??C)    03/21 0701 - 03/21 1900  In: 240 [P.O.:240]  Out: 650 [Urine:650]   03/19 1901 - 03/21 0700  In: 800 [P.O.:800]  Out: 3294 [Urine:3275]    General:  Alert, cooperative, no distress, appears stated age.   Lungs:    Still significant exp wheezes    :     Heart:  Regular rate and rhythm, S1, S2 normal, no murmur, click, rub or gallop.   Abdomen:   Soft, non-tender. Bowel sounds normal. No masses,  No organomegaly.   Extremities:  FROM, no LE edema   Pulses: 2+ and symmetric all extremities.   Skin: Skin color, texture, turgor normal. No rashes or lesions   :      Data Review:       24 Hour Results:  Recent Results (from  the past 24 hour(s))   GLUCOSE, POC    Collection Time: 08/31/18  5:14 PM   Result Value Ref Range    Glucose (POC) 267 (H) 65 - 105 mg/dL   GLUCOSE, POC     Collection Time: 08/31/18  9:49 PM   Result Value Ref Range    Glucose (POC) 261 (H) 65 - 105 mg/dL   GLUCOSE, POC    Collection Time: 09/01/18  4:03 AM   Result Value Ref Range    Glucose (POC) 259 (H) 65 - 105 mg/dL   RENAL FUNCTION PANEL    Collection Time: 09/01/18  6:59 AM   Result Value Ref Range    Sodium 130 (L) 136 - 145 mEq/L    Potassium 5.2 (H) 3.5 - 5.1 mEq/L    Chloride 97 (L) 98 - 107 mEq/L    CO2 24 21 - 32 mEq/L    Glucose 385 (H) 74 - 106 mg/dl    BUN 40 (H) 7 - 25 mg/dl    Creatinine 1.0 0.6 - 1.3 mg/dl    GFR est AA >58.6      GFR est non-AA 59      Calcium 8.3 (L) 8.5 - 10.1 mg/dl    Albumin 2.8 (L) 3.4 - 5.0 gm/dl    Phosphorus 2.8 2.5 - 4.9 mg/dl    Anion gap 9 5 - 15 mmol/L   GLUCOSE, POC    Collection Time: 09/01/18  7:53 AM   Result Value Ref Range    Glucose (POC) 356 (H) 65 - 105 mg/dL   GLUCOSE, POC    Collection Time: 09/01/18 12:12 PM   Result Value Ref Range    Glucose (POC) 248 (H) 65 - 105 mg/dL       Problem List:  Problem List as of 09/01/2018 Date Reviewed: August 21, 2018          Codes Class Noted - Resolved    Respiratory failure with hypoxia [facility]) ICD-10-CM: J96.91  ICD-9-CM: 518.81  08/22/2018 - Present        Headache ICD-10-CM: R51  ICD-9-CM: 784.0  08/05/2018 - Present        Abdominal pain ICD-10-CM: R10.9  ICD-9-CM: 789.00  02/04/2018 - Present        SIRS (systemic inflammatory response syndrome) (HCC) ICD-10-CM: R65.10  ICD-9-CM: 995.90  10/09/2017 - Present        Hyperkalemia ICD-10-CM: E87.5  ICD-9-CM: 276.7  09/08/2017 - Present        Obtunded ICD-10-CM: R40.1  ICD-9-CM: 780.09  09/08/2017 - Present        Acute renal failure (ARF) (HCC) ICD-10-CM: N17.9  ICD-9-CM: 584.9  09/08/2017 - Present        Septic shock (HCC) ICD-10-CM: A41.9, R65.21  ICD-9-CM: 038.9, 785.52, 995.92  09/08/2017 - Present        Metabolic encephalopathy ICD-10-CM: G93.41  ICD-9-CM: 348.31  09/08/2017 - Present        Dehydration ICD-10-CM: E86.0  ICD-9-CM: 276.51  08/07/2017 - Present         COPD with acute exacerbation (HCC) ICD-10-CM: J44.1  ICD-9-CM: 491.21  08/07/2017 - Present        Acute-on-chronic kidney injury (HCC) ICD-10-CM: N17.9, N18.9  ICD-9-CM: 584.9, 585.9  08/07/2017 - Present        Chest pain ICD-10-CM: R07.9  ICD-9-CM: 786.50  06/15/2017 - Present        Dyspnea ICD-10-CM: R06.00  ICD-9-CM: 786.09  06/15/2017 - Present  Type 2 diabetes mellitus with diabetic neuropathy (HCC) ICD-10-CM: E11.40  ICD-9-CM: 250.60, 357.2  12/07/2016 - Present        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9  ICD-9-CM: 038.9, 995.91  07/24/2016 - Present        Asthma exacerbation ICD-10-CM: J45.901  ICD-9-CM: 493.92  07/24/2016 - Present        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: E11.21  ICD-9-CM: 250.40, 583.81  06/12/2016 - Present        Sacroiliitis (HCC) ICD-10-CM: M46.1  ICD-9-CM: 720.2  11/25/2015 - Present        Essential hypertension ICD-10-CM: I10  ICD-9-CM: 401.9  10/06/2015 - Present        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  ICD-9-CM: 721.3  09/15/2015 - Present        Lumbar and sacral osteoarthritis ICD-10-CM: M47.817  ICD-9-CM: 721.3  09/15/2015 - Present        Chronic pain syndrome ICD-10-CM: G89.4  ICD-9-CM: 338.4  09/15/2015 - Present        Type 2 diabetes mellitus with hyperglycemia, without long-term current  use of insulin (HCC) ICD-10-CM: E11.65  ICD-9-CM: 250.00, 790.29  08/20/2015 - Present        Acute colitis ICD-10-CM: K52.9  ICD-9-CM: 558.9  07/02/2015 - Present        Acute hyperglycemia ICD-10-CM: R73.9  ICD-9-CM: 790.29  07/02/2015 - Present        Accelerated hypertension ICD-10-CM: I10  ICD-9-CM: 401.0  04/08/2015 - Present        Severe headache ICD-10-CM: R51  ICD-9-CM: 784.0  04/07/2015 - Present        Osteoarthritis of hips, 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  ICD-9-CM: 715.95  01/29/2015 - Present        PTSD (post-traumatic stress disorder) (Chronic) ICD-10-CM: F43.10  ICD-9-CM: 309.81  01/29/2015 - Present        Severe hypertension (Chronic) ICD-10-CM: I10  ICD-9-CM: 401.9  07/21/2014 - Present        RESOLVED: COPD exacerbation (HCC) ICD-10-CM: J44.1  ICD-9-CM: 491.21  11/14/2017 - 08/08/2018        RESOLVED: New onset type 1 diabetes mellitus, uncontrolled (HCC) ICD-10-CM: E10.65  ICD-9-CM: 250.03  07/02/2015 - 10/06/2015        RESOLVED: Recurrent major depressive disorder, in full remission (HCC) ICD-10-CM: F33.42  ICD-9-CM: 296.36  12/02/2014 - 12/02/2014        RESOLVED: Foreign body in colon ICD-10-CM: T18.4XXA  ICD-9-CM: 936  08/14/2014 - 01/29/2015        RESOLVED: Advanced care planning/counseling discussion ICD-10-CM: E09.23  ICD-9-CM: V65.49  07/21/2014 - 01/29/2015    Overview Signed 07/21/2014  3:46 PM by Littie Deeds  Patient given [place] application               RESOLVED: Hip pain, chronic ICD-10-CM: M25.559, G89.29  ICD-9-CM: 719.45, 338.29  07/21/2014 - 01/29/2015               Medications reviewed  Current Facility-Administered Medications   Medication Dose Route Frequency   ??? acetylcysteine (MUCOMYST) 100 mg/mL (10 %) nebulizer solution 400 mg  4 mL Nebulization Q6H RT   ??? albuterol-ipratropium (DUO-NEB) 2.5 MG-0.5 MG/3 ML  3 mL Nebulization Q6H RT   ??? dilTIAZem CD (CARDIZEM CD) capsule 240 mg  240 mg Oral DAILY   ??? losartan (COZAAR) tablet 50 mg  50 mg Oral DAILY    ??? polyethylene glycol (MIRALAX) packet 17 g  17 g Oral BID PRN   ??? bisacodyL (DULCOLAX) suppository 10 mg  10 mg Rectal DAILY PRN   ??? azithromycin (ZITHROMAX) tablet 500 mg  500 mg Oral DAILY   ??? methylPREDNISolone (PF) (SOLU-MEDROL) injection 40 mg  40 mg IntraVENous Q6H   ??? guaiFENesin ER (MUCINEX) tablet 1,200 mg  1,200 mg Oral Q12H   ??? LORazepam (ATIVAN) injection 0.5 mg  0.5 mg IntraVENous Q6H PRN   ??? cetirizine (ZYRTEC) tablet 10 mg  10 mg Oral DAILY   ??? fluticasone propionate (FLONASE) 50 mcg/actuation nasal spray 2 Spray  2 Spray Both Nostrils BID   ??? HYDROcodone-acetaminophen (NORCO) 5-325 mg per tablet 1-2 Tab  1-2 Tab Oral Q4H PRN   ??? HYDROmorphone (DILAUDID) injection 0.5-1 mg  0.5-1 mg IntraVENous Q3H PRN   ??? docusate sodium (COLACE) capsule 100 mg  100 mg Oral BID   ??? hydrOXYzine HCL (ATARAX) tablet 25 mg  25 mg Oral TID PRN   ??? cloNIDine HCL (CATAPRES) tablet 0.3 mg  0.3 mg Oral BID   ??? QUEtiapine SR (SEROquel XR) tablet 150 mg  150 mg Oral QHS   ??? revefenacin (YUPELRI) nebulizer solution 175 mcg  175 mcg Nebulization DAILY   ??? dextromethorphan (DELSYM) 30 mg/5 mL syrup 30 mg  30 mg Oral Q12H PRN   ??? gabapentin (NEURONTIN) capsule 300 mg  300 mg Oral TID   ??? montelukast (SINGULAIR) tablet 10 mg  10 mg Oral QHS   ??? naloxone (NARCAN) injection 0.1 mg  0.1 mg IntraVENous PRN   ??? acetaminophen (TYLENOL) tablet 650 mg  650 mg Oral Q4H PRN    Or   ??? acetaminophen (TYLENOL) solution 650 mg  650 mg Oral Q4H PRN    Or   ??? acetaminophen (TYLENOL) suppository 650 mg  650 mg Rectal Q4H PRN   ??? HYDROcodone-acetaminophen (NORCO) 5-325 mg per tablet 1 Tab  1 Tab Oral Q4H PRN   ??? ondansetron (ZOFRAN) injection 4 mg  4 mg IntraVENous Q4H PRN   ??? heparin (porcine) injection 5,000 Units  5,000 Units SubCUTAneous Q8H   ??? arformoteroL (BROVANA) neb solution 15 mcg  15 mcg Nebulization BID RT   ??? budesonide (PULMICORT) 500 mcg/2 ml nebulizer suspension  500 mcg Nebulization BID RT    ??? dextrose (D50W) injection syrg 5-25 g  10-50 mL IntraVENous PRN   ??? glucagon (GLUCAGEN) injection 1 mg  1 mg IntraMUSCular PRN   ??? insulin glargine (LANTUS) injection 1-100 Units  1-100 Units SubCUTAneous QHS   ??? insulin lispro (HUMALOG) injection 1-100 Units  1-100 Units SubCUTAneous AC&HS   ??? insulin lispro (HUMALOG) injection 1-100 Units  1-100 Units SubCUTAneous PRN  Care Plan discussed with: pt     Total time spent with patient: 25 minutes.    Darrin Nipper, MD  September 01, 2018  1045
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
## 2018-09-01 NOTE — Progress Notes (Signed)
Feels well. No obvious air leak. No obvious PTX on this am CXR. Will leave on suction for now.

## 2018-09-01 NOTE — Progress Notes (Signed)
Hospitalist Progress Note         [facility]    Daily Progress Note: 09/01/2018    Assessment/Plan:   1.  Acute hypoxic respiratory failure:     3/18: she had an RRT called on her this morning because she went into acute hypoxic respiratory failure tachycardia and tachypnea.  She appeared to be quite tight with only minimal wheezing at the bases but she appeared to be quite anxious.  -We will need to transfer her to stepdown at this time I will increase her Solu-Medrol to-25 mg IV x1 and restart Solu-Medrol 60 mg IV every 6 hours I have increased her DuoNeb to every 4 hours and make albuterol nebulizer every 2 hours and started Mucomyst 40 mg every 6 hours and Mucinex 1200 mg twice daily as the patient does have a seasonal allergy component.  -Started Ativan 0.5 mg IV every 6 hours as needed for anxiety as the patient does have PTSD.  -We will check a chest x-ray.  -I discussed with nursing staff respiratory therapy and stepdown the patient will need to be transferred to stepdown at this time.  -We will check an ABG given her acute hypoxic change but the patient is alert and oriented x3 and mentating very well.  - will check a chest xray to make certain there isnt an infiltrate contributing to her distress.  - on 3/18; left-sided chest tube placed on 317 after chest x-ray was obtained.              -Appreciate Dr. Assunta Found assistance.              -Chest tube has been minimal bloody drainage at this time. -40 cm of suction.  Chest tube without air leak.            -Chest x-ray does show improvement but still small apical pneumothorax.      -3/19; CT of thorax performed shows bullae and blebs.  There is mention of a 17 mm pulmonary left lung nodule.  No pneumothorax per CAT scan thorax.  -Clinically she feels better though.    -3/20: Chest x-ray still shows left pneumothorax: 10 to 15%.          : Therefore currently chest tube with suction.  : Respiratory status remains stable.    - 3/21: Chest x-ray this  morning showed no air leak no pneumothorax.              We will leave on suction per cardiothoracic surgery.              We will de-escalate Solu-Medrol to prednisone 40 mg once a day.  - 3/22: appears to be improving. Cxr: shows no obvious air leak, no obvious               PTX. Chest tube on suction.    2.  Acute exacerbation COPD. Called earlier by RT who noted Pt had been changed to Prednisone  every day, resumed Solumedrol  q 8h.     -Minimal expiratory wheezing we will continue Solu-Medrol IV every 8 hours at this time .  -No wheezing at this time will de-escalate to prednisone 40 mg at this time.    3.  Acute bronchitis  4.  HTN, uncontrolled: Due to acute dyspnea.       -We will increase losartan to 50 mg and increase diltiazem to 240 mg from         180 mg.  5.  T2DM  6.  Left upper lobe lung nodule, will need continued follow-up   7.  Dyslipidemia  8.  PTSD, she was in [place], has had respiratory problems since.  RN notes anxiety @ times. Try prn Hydroxyzine  9.  Peripheral neuropathy  10.  GERD.    11.  DJD  12.  DVT proph w sc Heparin  13.  Dispo: Still probably needs a couple of days  Subjective:   Interval history:    3/21: She appears comfortable and in no respiratory distress at this time.    3/20 Doing well . Only mild pleuritic chest pain.    3/19: She is lying in bed and states that she feels much better.  States her breathing is improved.  Appears to be quite happy with her progress.    Minimal drainage from chest tube.    3/18; she appears to be more comfortable today but obviously and still moderate amount of pain to the chest tube which is to be expected.  Overall respiratory status is improved.  She states that her anxiety is controlled at this time.  She appears quite optimistic as she should be.      3/17: Rapid response team was called because she patient went into acute hypoxic respiratory failure and began to have tach tachycardia and tachypnea the patient  had to be placed back on BiPAP at this time.  An ABG chest x-ray will be obtained we will have to escalate her nebulizer treatments.  We will need to move her to the stepdown at this time.  Respiratory is on board.  She does appear to be quite anxious and therefore gave Ativan 0.5 mg IV x1 and will make Ativan 0.5 mg IV every 6 hours as needed as patient does have a history of PTSD she was exposed to respiratory problems since we will treat started on 11.      Cardiovascular ROS: no chest pain ,  orthopnea  Gastrointestinal ROS: no abdominal pain, nausea,vomiting, diarrhea, melena or hematechezia  Genito-Urinary ROS: no dysuria, trouble voiding, or hematuria  Objective:   Physical Exam:     Visit Vitals  BP (!) 133/94 (BP 1 Location: Right arm, BP Patient Position: Supine)   Pulse 84   Temp 98.4 ??F (36.9 ??C)   Resp 20   Ht 5\' 8"  (1.727 m)   Wt 93.8 kg (206 lb 11.2 oz)   SpO2 98%   BMI 31.43 kg/m??    O2 Flow Rate (L/min): 2 l/min O2 Device: Nasal cannula    Temp (24hrs), Avg:98.2 ??F (36.8 ??C), Min:97.9 ??F (36.6 ??C), Max:98.7 ??F (37.1 ??C)    03/21 0701 - 03/21 1900  In: 240 [P.O.:240]  Out: 650 [Urine:650]   03/19 1901 - 03/21 0700  In: 800 [P.O.:800]  Out: 3294 [Urine:3275]    General:  Alert, cooperative, no distress, appears stated age.   Lungs:    Still significant exp wheezes    :     Heart:  Regular rate and rhythm, S1, S2 normal, no murmur, click, rub or gallop.   Abdomen:   Soft, non-tender. Bowel sounds normal. No masses,  No organomegaly.   Extremities:  FROM, no LE edema   Pulses: 2+ and symmetric all extremities.   Skin: Skin color, texture, turgor normal. No rashes or lesions   :      Data Review:       24 Hour Results:  Recent Results (from  the past 24 hour(s))   GLUCOSE, POC    Collection Time: 08/31/18  5:14 PM   Result Value Ref Range    Glucose (POC) 267 (H) 65 - 105 mg/dL   GLUCOSE, POC    Collection Time: 08/31/18  9:49 PM   Result Value Ref Range    Glucose (POC) 261 (H) 65 - 105 mg/dL   GLUCOSE,  POC    Collection Time: 09/01/18  4:03 AM   Result Value Ref Range    Glucose (POC) 259 (H) 65 - 105 mg/dL   RENAL FUNCTION PANEL    Collection Time: 09/01/18  6:59 AM   Result Value Ref Range    Sodium 130 (L) 136 - 145 mEq/L    Potassium 5.2 (H) 3.5 - 5.1 mEq/L    Chloride 97 (L) 98 - 107 mEq/L    CO2 24 21 - 32 mEq/L    Glucose 385 (H) 74 - 106 mg/dl    BUN 40 (H) 7 - 25 mg/dl    Creatinine 1.0 0.6 - 1.3 mg/dl    GFR est AA >11.9      GFR est non-AA 59      Calcium 8.3 (L) 8.5 - 10.1 mg/dl    Albumin 2.8 (L) 3.4 - 5.0 gm/dl    Phosphorus 2.8 2.5 - 4.9 mg/dl    Anion gap 9 5 - 15 mmol/L   GLUCOSE, POC    Collection Time: 09/01/18  7:53 AM   Result Value Ref Range    Glucose (POC) 356 (H) 65 - 105 mg/dL   GLUCOSE, POC    Collection Time: 09/01/18 12:12 PM   Result Value Ref Range    Glucose (POC) 248 (H) 65 - 105 mg/dL       Problem List:  Problem List as of 09/01/2018 Date Reviewed: Aug 18, 2018          Codes Class Noted - Resolved    Respiratory failure with hypoxia [facility]) ICD-10-CM: J96.91  ICD-9-CM: 518.81  08/22/2018 - Present        Headache ICD-10-CM: R51  ICD-9-CM: 784.0  08/05/2018 - Present        Abdominal pain ICD-10-CM: R10.9  ICD-9-CM: 789.00  02/04/2018 - Present        SIRS (systemic inflammatory response syndrome) (HCC) ICD-10-CM: R65.10  ICD-9-CM: 995.90  10/09/2017 - Present        Hyperkalemia ICD-10-CM: E87.5  ICD-9-CM: 276.7  09/08/2017 - Present        Obtunded ICD-10-CM: R40.1  ICD-9-CM: 780.09  09/08/2017 - Present        Acute renal failure (ARF) (HCC) ICD-10-CM: N17.9  ICD-9-CM: 584.9  09/08/2017 - Present        Septic shock (HCC) ICD-10-CM: A41.9, R65.21  ICD-9-CM: 038.9, 785.52, 995.92  09/08/2017 - Present        Metabolic encephalopathy ICD-10-CM: G93.41  ICD-9-CM: 348.31  09/08/2017 - Present        Dehydration ICD-10-CM: E86.0  ICD-9-CM: 276.51  08/07/2017 - Present        COPD with acute exacerbation (HCC) ICD-10-CM: J44.1  ICD-9-CM: 491.21  08/07/2017 - Present        Acute-on-chronic kidney  injury (HCC) ICD-10-CM: N17.9, N18.9  ICD-9-CM: 584.9, 585.9  08/07/2017 - Present        Chest pain ICD-10-CM: R07.9  ICD-9-CM: 786.50  06/15/2017 - Present        Dyspnea ICD-10-CM: R06.00  ICD-9-CM: 786.09  06/15/2017 - Present  Type 2 diabetes mellitus with diabetic neuropathy (HCC) ICD-10-CM: E11.40  ICD-9-CM: 250.60, 357.2  12/07/2016 - Present        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9  ICD-9-CM: 038.9, 995.91  07/24/2016 - Present        Asthma exacerbation ICD-10-CM: J45.901  ICD-9-CM: 493.92  07/24/2016 - Present        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: E11.21  ICD-9-CM: 250.40, 583.81  06/12/2016 - Present        Sacroiliitis (HCC) ICD-10-CM: M46.1  ICD-9-CM: 720.2  11/25/2015 - Present        Essential hypertension ICD-10-CM: I10  ICD-9-CM: 401.9  10/06/2015 - Present        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  ICD-9-CM: 721.3  09/15/2015 - Present        Lumbar and sacral osteoarthritis ICD-10-CM: M47.817  ICD-9-CM: 721.3  09/15/2015 - Present        Chronic pain syndrome ICD-10-CM: G89.4  ICD-9-CM: 338.4  09/15/2015 - Present        Type 2 diabetes mellitus with hyperglycemia, without long-term current use of insulin (HCC) ICD-10-CM: E11.65  ICD-9-CM: 250.00, 790.29  08/20/2015 - Present        Acute colitis ICD-10-CM: K52.9  ICD-9-CM: 558.9  07/02/2015 -  Present        Acute hyperglycemia ICD-10-CM: R73.9  ICD-9-CM: 790.29  07/02/2015 - Present        Accelerated hypertension ICD-10-CM: I10  ICD-9-CM: 401.0  04/08/2015 - Present        Severe headache ICD-10-CM: R51  ICD-9-CM: 784.0  04/07/2015 - Present        Osteoarthritis of hips, 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  ICD-9-CM: 715.95  01/29/2015 - Present        PTSD (post-traumatic stress disorder) (Chronic) ICD-10-CM: F43.10  ICD-9-CM: 309.81  01/29/2015 - Present        Severe hypertension (Chronic) ICD-10-CM: I10  ICD-9-CM: 401.9  07/21/2014 - Present        RESOLVED: COPD exacerbation (HCC) ICD-10-CM: J44.1  ICD-9-CM: 491.21  11/14/2017 - 08/08/2018        RESOLVED: New onset type 1 diabetes mellitus, uncontrolled (HCC) ICD-10-CM: E10.65  ICD-9-CM: 250.03  07/02/2015 - 10/06/2015        RESOLVED: Recurrent major depressive disorder, in full remission (HCC) ICD-10-CM: F33.42  ICD-9-CM: 296.36  12/02/2014 - 12/02/2014        RESOLVED: Foreign body in colon ICD-10-CM: T18.4XXA  ICD-9-CM: 936  08/14/2014 - 01/29/2015        RESOLVED: Advanced care planning/counseling discussion ICD-10-CM: I51.89  ICD-9-CM: V65.49  07/21/2014 - 01/29/2015    Overview Signed 07/21/2014  3:46 PM by Littie Deeds  Patient given [place] application               RESOLVED: Hip pain, chronic ICD-10-CM: M25.559, G89.29  ICD-9-CM: 719.45, 338.29  07/21/2014 - 01/29/2015               Medications reviewed  Current Facility-Administered Medications   Medication Dose Route Frequency   ??? acetylcysteine (MUCOMYST) 100 mg/mL (10 %) nebulizer solution 400 mg  4 mL Nebulization Q6H RT   ??? albuterol-ipratropium (DUO-NEB) 2.5 MG-0.5 MG/3 ML  3 mL Nebulization Q6H RT   ??? dilTIAZem CD (CARDIZEM CD) capsule 240 mg  240 mg Oral DAILY   ??? losartan (COZAAR) tablet 50 mg  50 mg Oral DAILY   ??? polyethylene glycol (MIRALAX) packet 17 g  17 g Oral BID PRN   ??? bisacodyL (DULCOLAX) suppository 10 mg  10 mg Rectal DAILY PRN   ??? azithromycin (ZITHROMAX) tablet 500 mg  500  mg Oral DAILY   ??? methylPREDNISolone (PF) (SOLU-MEDROL) injection 40 mg  40 mg IntraVENous Q6H   ??? guaiFENesin ER (MUCINEX) tablet 1,200 mg  1,200 mg Oral Q12H   ??? LORazepam (ATIVAN) injection 0.5 mg  0.5 mg IntraVENous Q6H PRN   ??? cetirizine (ZYRTEC) tablet 10 mg  10 mg Oral DAILY   ??? fluticasone propionate (FLONASE) 50 mcg/actuation nasal spray 2 Spray  2 Spray Both Nostrils BID   ??? HYDROcodone-acetaminophen (NORCO) 5-325 mg per tablet 1-2 Tab  1-2 Tab Oral Q4H PRN   ??? HYDROmorphone (DILAUDID) injection 0.5-1 mg  0.5-1 mg IntraVENous Q3H PRN   ??? docusate sodium (COLACE) capsule 100 mg  100 mg Oral BID   ??? hydrOXYzine HCL (ATARAX) tablet 25 mg  25 mg Oral TID PRN   ??? cloNIDine HCL (CATAPRES) tablet 0.3 mg  0.3 mg Oral BID   ??? QUEtiapine SR (SEROquel XR) tablet 150 mg  150 mg Oral QHS   ??? revefenacin (YUPELRI) nebulizer solution 175 mcg  175 mcg Nebulization DAILY   ??? dextromethorphan (DELSYM) 30 mg/5 mL syrup 30 mg  30 mg Oral Q12H PRN   ??? gabapentin (NEURONTIN) capsule 300 mg  300 mg Oral TID   ??? montelukast (SINGULAIR) tablet 10 mg  10 mg Oral QHS   ??? naloxone (NARCAN) injection 0.1 mg  0.1 mg IntraVENous PRN   ??? acetaminophen (TYLENOL) tablet 650 mg  650 mg Oral Q4H PRN    Or   ??? acetaminophen (TYLENOL) solution 650 mg  650 mg Oral Q4H PRN    Or   ??? acetaminophen (TYLENOL) suppository 650 mg  650 mg Rectal Q4H PRN   ??? HYDROcodone-acetaminophen (NORCO) 5-325 mg per tablet 1 Tab  1 Tab Oral Q4H PRN   ??? ondansetron (ZOFRAN) injection 4 mg  4 mg IntraVENous Q4H PRN   ??? heparin (porcine) injection 5,000 Units  5,000 Units SubCUTAneous Q8H   ??? arformoteroL (BROVANA) neb solution 15 mcg  15 mcg Nebulization BID RT   ??? budesonide (PULMICORT) 500 mcg/2 ml nebulizer suspension  500 mcg Nebulization BID RT   ??? dextrose (D50W) injection syrg 5-25 g  10-50 mL IntraVENous PRN   ??? glucagon (GLUCAGEN) injection 1 mg  1 mg IntraMUSCular PRN   ??? insulin glargine (LANTUS) injection 1-100 Units  1-100 Units SubCUTAneous QHS   ???  insulin lispro (HUMALOG) injection 1-100 Units  1-100 Units SubCUTAneous AC&HS   ??? insulin lispro (HUMALOG) injection 1-100 Units  1-100 Units SubCUTAneous PRN  Care Plan discussed with: pt     Total time spent with patient: 25 minutes.    Darrin Nipper, MD  September 01, 2018  1045

## 2018-09-02 LAB — RENAL FUNCTION PANEL
Albumin: 2.7 gm/dl — ABNORMAL LOW (ref 3.4–5.0)
Albumin: 2.7 gm/dl — ABNORMAL LOW (ref 3.4–5.0)
Anion Gap: 6 mmol/L (ref 5–15)
Anion gap: 6 mmol/L (ref 5–15)
BUN: 40 mg/dl — ABNORMAL HIGH (ref 7–25)
BUN: 40 mg/dl — ABNORMAL HIGH (ref 7–25)
CO2: 28 mEq/L (ref 21–32)
CO2: 28 mEq/L (ref 21–32)
Calcium: 8.3 mg/dl — ABNORMAL LOW (ref 8.5–10.1)
Calcium: 8.3 mg/dl — ABNORMAL LOW (ref 8.5–10.1)
Chloride: 99 mEq/L (ref 98–107)
Chloride: 99 mEq/L (ref 98–107)
Creatinine: 0.9 mg/dl (ref 0.6–1.3)
Creatinine: 0.9 mg/dl (ref 0.6–1.3)
EGFR IF NonAfrican American: >60
GFR African American: >60
GFR est AA: >60
GFR est non-AA: >60
Glucose: 251 mg/dl — ABNORMAL HIGH (ref 74–106)
Glucose: 251 mg/dl — ABNORMAL HIGH (ref 74–106)
Phosphorus: 2.5 mg/dl (ref 2.5–4.9)
Phosphorus: 2.5 mg/dl (ref 2.5–4.9)
Potassium: 5 mEq/L (ref 3.5–5.1)
Potassium: 5 mEq/L (ref 3.5–5.1)
Sodium: 132 mEq/L — ABNORMAL LOW (ref 136–145)
Sodium: 132 mEq/L — ABNORMAL LOW (ref 136–145)

## 2018-09-02 LAB — POCT GLUCOSE
POC Glucose: 179 mg/dL — ABNORMAL HIGH (ref 65–105)
POC Glucose: 207 mg/dL — ABNORMAL HIGH (ref 65–105)
POC Glucose: 236 mg/dL — ABNORMAL HIGH (ref 65–105)
POC Glucose: 286 mg/dL — ABNORMAL HIGH (ref 65–105)

## 2018-09-02 LAB — GLUCOSE, POC
Glucose (POC): 179 mg/dL — ABNORMAL HIGH (ref 65–105)
Glucose (POC): 207 mg/dL — ABNORMAL HIGH (ref 65–105)
Glucose (POC): 236 mg/dL — ABNORMAL HIGH (ref 65–105)
Glucose (POC): 286 mg/dL — ABNORMAL HIGH (ref 65–105)

## 2018-09-02 MED ORDER — ACETYLCYSTEINE 10 % (100 MG/ML) SOLN
100 mg/mL (10 %) | Freq: Two times a day (BID) | Status: DC
Start: 2018-09-02 — End: 2018-09-04
  Administered 2018-09-02 – 2018-09-04 (×4): via RESPIRATORY_TRACT

## 2018-09-02 MED FILL — YUPELRI 175 MCG/3 ML SOLUTION FOR NEBULIZATION: 175 mcg/3 mL | RESPIRATORY_TRACT | Qty: 1

## 2018-09-02 MED FILL — DILTIAZEM ER 120 MG 24 HR CAP: 120 mg | ORAL | Qty: 2

## 2018-09-02 MED FILL — GABAPENTIN 300 MG CAP: 300 mg | ORAL | Qty: 1

## 2018-09-02 MED FILL — BROVANA 15 MCG/2 ML SOLUTION FOR NEBULIZATION: 15 mcg/2 mL | RESPIRATORY_TRACT | Qty: 1

## 2018-09-02 MED FILL — VELTASSA 8.4 GRAM ORAL POWDER PACKET: 8.4 gram | ORAL | Qty: 1

## 2018-09-02 MED FILL — HEPARIN (PORCINE) 5,000 UNIT/ML IJ SOLN: 5000 unit/mL | INTRAMUSCULAR | Qty: 1

## 2018-09-02 MED FILL — HYDROMORPHONE 1 MG/ML INJECTION SOLUTION: 1 mg/mL | INTRAMUSCULAR | Qty: 1

## 2018-09-02 MED FILL — PREDNISONE 20 MG TAB: 20 mg | ORAL | Qty: 2

## 2018-09-02 MED FILL — MONTELUKAST 10 MG TAB: 10 mg | ORAL | Qty: 1

## 2018-09-02 MED FILL — CETIRIZINE 5 MG TAB: 5 mg | ORAL | Qty: 2

## 2018-09-02 MED FILL — BUDESONIDE 0.5 MG/2 ML NEB SUSPENSION: 0.5 mg/2 mL | RESPIRATORY_TRACT | Qty: 1

## 2018-09-02 MED FILL — IPRATROPIUM-ALBUTEROL 2.5 MG-0.5 MG/3 ML NEB SOLUTION: 2.5 mg-0.5 mg/3 ml | RESPIRATORY_TRACT | Qty: 3

## 2018-09-02 MED FILL — ACETYLCYSTEINE 10 % (100 MG/ML) SOLN: 100 mg/mL (10 %) | Qty: 4

## 2018-09-02 MED FILL — QUETIAPINE SR 50 MG 24 HR TAB: 50 mg | ORAL | Qty: 3

## 2018-09-02 MED FILL — DOCUSATE SODIUM 100 MG CAP: 100 mg | ORAL | Qty: 1

## 2018-09-02 MED FILL — CLONIDINE 0.1 MG TAB: 0.1 mg | ORAL | Qty: 3

## 2018-09-02 MED FILL — MUCINEX 600 MG TABLET, EXTENDED RELEASE: 600 mg | ORAL | Qty: 2

## 2018-09-02 MED FILL — LOSARTAN 50 MG TAB: 50 mg | ORAL | Qty: 1

## 2018-09-02 MED FILL — AZITHROMYCIN 250 MG TAB: 250 mg | ORAL | Qty: 2

## 2018-09-02 NOTE — Other (Signed)
Bedside shift change report given to Sonia Rogers RN (oncoming nurse) by Nora Mabanglo, RN  (offgoing nurse). Report included the following information SBAR.

## 2018-09-02 NOTE — Progress Notes (Signed)
Hospitalist Progress Note         [facility]    Daily Progress Note: 09/02/2018    Assessment/Plan:   1.  Acute hypoxic respiratory failure: she had an RRT called on her this morning because she went into acute hypoxic respiratory failure tachycardia and tachypnea.  She appeared to be quite tight with only minimal wheezing at the bases but she appeared to be quite anxious.  -We will need to transfer her to stepdown at this time I will increase her Solu-Medrol to-25 mg IV x1 and restart Solu-Medrol 60 mg IV every 6 hours I have increased her DuoNeb to every 4 hours and make albuterol nebulizer every 2 hours and started Mucomyst 40 mg every 6 hours and Mucinex 1200 mg twice daily as the patient does have a seasonal allergy component.  -Started Ativan 0.5 mg IV every 6 hours as needed for anxiety as the patient does have PTSD.  -We will check a chest x-ray.  -I discussed with nursing staff respiratory therapy and stepdown the patient will need to be transferred to stepdown at this time.  -We will check an ABG given her acute hypoxic change but the patient is alert and oriented x3 and mentating very well.  - will check a chest xray to make certain there isnt an infiltrate contributing to her distress.  - on 3/18; left-sided chest tube placed on 317 after chest x-ray was obtained.              -Appreciate Dr. Assunta Found assistance.              -Chest tube has been minimal bloody drainage at this time. -40 cm of suction.  Chest tube without air leak.            -Chest x-ray does show improvement but still small apical pneumothorax.     -3/22: repeat chest xray in am. Respiratory status appears to be improving.      -3/19; CT of thorax performed shows bullae and blebs.  There is mention of a 17 mm pulmonary left lung nodule.  No pneumothorax per CAT scan thorax.  -Clinically she feels better though.    -3/20: Chest x-ray still shows left pneumothorax: 10 to 15%.           : Therefore currently chest tube with suction.  : Respiratory status remains stable.    - 3/21: Chest x-ray this morning showed no air leak no pneumothorax.              We will leave on suction per cardiothoracic surgery.              We will de-escalate Solu-Medrol to prednisone 40 mg once a day.    2.  Acute exacerbation COPD. Called earlier by RT who noted Pt had been changed to Prednisone 40mg  every day, resumed Solumedrol 60mg  q 8h.     -Minimal expiratory wheezing we will continue Solu-Medrol IV every 8 hours at this time .  -No wheezing at this time will de-escalate to prednisone 40 mg at this time.    3.  Acute bronchitis  4.  HTN, uncontrolled: Due to acute dyspnea.       -We will increase losartan to 50 mg and increase diltiazem to 240 mg from         180 mg.  5.  T2DM  6.  Left upper lobe lung nodule, will need continued follow-up   7.  Dyslipidemia  8.  PTSD, she was in [place], has had respiratory problems since.  RN notes anxiety @ times. Try prn Hydroxyzine  9.  Peripheral neuropathy  10.  GERD.    11.  DJD  12.  DVT proph w sc Heparin  13.  Dispo: Still probably needs a couple of days  Subjective:   Interval history:    3/22: She denies any major dyspnea she states her cough is improving.  She think she is constipated but she states that she is on laxatives and stool softeners at this time.  She appears happy with her progress.    3/21: She appears comfortable and in no respiratory distress at this time.    3/20 Doing well . Only mild pleuritic chest pain.    3/19: She is lying in bed and states that she feels much better.  States her breathing is improved.  Appears to be quite happy with her progress.    Minimal drainage from chest tube.    3/18; she appears to be more comfortable today but obviously and still moderate amount of pain to the chest tube which is to be expected.  Overall respiratory status is improved.  She states that her anxiety is  controlled at this time.  She appears quite optimistic as she should be.      3/17: Rapid response team was called because she patient went into acute hypoxic respiratory failure and began to have tach tachycardia and tachypnea the patient had to be placed back on BiPAP at this time.  An ABG chest x-ray will be obtained we will have to escalate her nebulizer treatments.  We will need to move her to the stepdown at this time.  Respiratory is on board.  She does appear to be quite anxious and therefore gave Ativan 0.5 mg IV x1 and will make Ativan 0.5 mg IV every 6 hours as needed as patient does have a history of PTSD she was exposed to respiratory problems since we will treat started on 11.      Cardiovascular ROS: no chest pain ,  orthopnea  Gastrointestinal ROS: no abdominal pain, nausea,vomiting, diarrhea, melena or hematechezia  Genito-Urinary ROS: no dysuria, trouble voiding, or hematuria  Objective:   Physical Exam:     Visit Vitals  BP 138/85   Pulse 83   Temp 97.6 ??F (36.4 ??C)   Resp 21   Ht  (1.727 m)   Wt 93.8 kg (206 lb 11.2 oz)   SpO2 99%   BMI 31.43 kg/m??    O2 Flow Rate (L/min): 2 l/min O2 Device: Room air    Temp (24hrs), Avg:98 ??F (36.7 ??C), Min:97.6 ??F (36.4 ??C), Max:98.4 ??F (36.9 ??C)    No intake/output data recorded.   03/20 1901 - 03/22 0700  In: 1735 [P.O.:1730]  Out: 2850 [Urine:2825]    General:  Alert, cooperative, no distress, appears stated age.   Lungs:    Still significant exp wheezes    :     Heart:  Regular rate and rhythm, S1, S2 normal, no murmur, click, rub or gallop.   Abdomen:   Soft, non-tender. Bowel sounds normal. No masses,  No organomegaly.   Extremities:  FROM, no LE edema   Pulses: 2+ and symmetric all extremities.   Skin: Skin color, texture, turgor normal. No rashes or lesions   :      Data Review:       24 Hour Results:  Recent Results (from  the past 24 hour(s))   GLUCOSE, POC    Collection Time: 09/01/18 12:12 PM   Result Value Ref Range     Glucose (POC) 248 (H) 65 - 105 mg/dL   GLUCOSE, POC    Collection Time: 09/01/18  5:16 PM   Result Value Ref Range    Glucose (POC) 270 (H) 65 - 105 mg/dL   GLUCOSE, POC    Collection Time: 09/01/18  9:50 PM   Result Value Ref Range    Glucose (POC) 179 (H) 65 - 105 mg/dL   RENAL FUNCTION PANEL    Collection Time: 09/02/18  6:42 AM   Result Value Ref Range    Sodium 132 (L) 136 - 145 mEq/L    Potassium 5.0 3.5 - 5.1 mEq/L    Chloride 99 98 - 107 mEq/L    CO2 28 21 - 32 mEq/L    Glucose 251 (H) 74 - 106 mg/dl    BUN 40 (H) 7 - 25 mg/dl    Creatinine 0.9 0.6 - 1.3 mg/dl    GFR est AA >56.2      GFR est non-AA >60      Calcium 8.3 (L) 8.5 - 10.1 mg/dl    Albumin 2.7 (L) 3.4 - 5.0 gm/dl    Phosphorus 2.5 2.5 - 4.9 mg/dl    Anion gap 6 5 - 15 mmol/L   GLUCOSE, POC    Collection Time: 09/02/18  8:31 AM   Result Value Ref Range    Glucose (POC) 207 (H) 65 - 105 mg/dL       Problem List:  Problem List as of 09/02/2018 Date Reviewed: August 16, 2018          Codes Class Noted - Resolved    Respiratory failure with hypoxia [facility] [address]) ICD-10-CM: J96.91  ICD-9-CM: 518.81  08/22/2018 - Present        Headache ICD-10-CM: R51  ICD-9-CM: 784.0  08/05/2018 - Present        Abdominal pain ICD-10-CM: R10.9  ICD-9-CM: 789.00  02/04/2018 - Present        SIRS (systemic inflammatory response syndrome) (HCC) ICD-10-CM: R65.10  ICD-9-CM: 995.90  10/09/2017 - Present        Hyperkalemia ICD-10-CM: E87.5  ICD-9-CM: 276.7  09/08/2017 - Present        Obtunded ICD-10-CM: R40.1  ICD-9-CM: 780.09  09/08/2017 - Present        Acute renal failure (ARF) (HCC) ICD-10-CM: N17.9  ICD-9-CM: 584.9  09/08/2017 - Present        Septic shock (HCC) ICD-10-CM: A41.9, R65.21  ICD-9-CM: 038.9, 785.52, 995.92  09/08/2017 - Present        Metabolic encephalopathy ICD-10-CM: G93.41  ICD-9-CM: 348.31  09/08/2017 - Present        Dehydration ICD-10-CM: E86.0  ICD-9-CM: 276.51  08/07/2017 - Present        COPD with acute exacerbation (HCC) ICD-10-CM: J44.1   ICD-9-CM: 491.21  08/07/2017 - Present        Acute-on-chronic kidney injury (HCC) ICD-10-CM: N17.9, N18.9  ICD-9-CM: 584.9, 585.9  08/07/2017 - Present        Chest pain ICD-10-CM: R07.9  ICD-9-CM: 786.50  06/15/2017 - Present        Dyspnea ICD-10-CM: R06.00  ICD-9-CM: 786.09  06/15/2017 - Present        Type 2 diabetes mellitus with diabetic neuropathy (HCC) ICD-10-CM: E11.40  ICD-9-CM: 250.60, 357.2  12/07/2016 - Present        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9  ICD-9-CM: 038.9, 995.91  07/24/2016 - Present        Asthma exacerbation ICD-10-CM: J45.901  ICD-9-CM: 493.92  07/24/2016 - Present        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: E11.21  ICD-9-CM: 250.40, 583.81  06/12/2016 - Present        Sacroiliitis (HCC) ICD-10-CM: M46.1  ICD-9-CM: 720.2  11/25/2015 - Present        Essential hypertension ICD-10-CM: I10  ICD-9-CM: 401.9  10/06/2015 - Present        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  ICD-9-CM: 721.3  09/15/2015 - Present        Lumbar and sacral osteoarthritis ICD-10-CM: M47.817  ICD-9-CM: 721.3  09/15/2015 - Present        Chronic pain syndrome ICD-10-CM: G89.4  ICD-9-CM: 338.4  09/15/2015 - Present        Type 2 diabetes mellitus with hyperglycemia, without long-term current use of insulin (HCC) ICD-10-CM: E11.65  ICD-9-CM: 250.00, 790.29  08/20/2015 - Present         Acute colitis ICD-10-CM: K52.9  ICD-9-CM: 558.9  07/02/2015 - Present        Acute hyperglycemia ICD-10-CM: R73.9  ICD-9-CM: 790.29  07/02/2015 - Present        Accelerated hypertension ICD-10-CM: I10  ICD-9-CM: 401.0  04/08/2015 - Present        Severe headache ICD-10-CM: R51  ICD-9-CM: 784.0  04/07/2015 - Present        Osteoarthritis of hips, 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  ICD-9-CM: 715.95  01/29/2015 - Present        PTSD (post-traumatic stress disorder) (Chronic) ICD-10-CM: F43.10  ICD-9-CM: 309.81  01/29/2015 - Present        Severe hypertension (Chronic) ICD-10-CM: I10  ICD-9-CM: 401.9  07/21/2014 - Present        RESOLVED: COPD exacerbation (HCC) ICD-10-CM: J44.1  ICD-9-CM: 491.21  11/14/2017 - 08/08/2018        RESOLVED: New onset type 1 diabetes mellitus, uncontrolled (HCC) ICD-10-CM: E10.65  ICD-9-CM: 250.03  07/02/2015 - 10/06/2015        RESOLVED: Recurrent major depressive disorder, in full remission (HCC) ICD-10-CM: F33.42  ICD-9-CM: 296.36  12/02/2014 - 12/02/2014        RESOLVED: Foreign body in colon ICD-10-CM: T18.4XXA  ICD-9-CM: 936  08/14/2014 - 01/29/2015        RESOLVED: Advanced care planning/counseling discussion ICD-10-CM: Z71.89  ICD-9-CM: V65.49  07/21/2014 - 01/29/2015    Overview Signed 07/21/2014  3:46 PM by Littie Deeds     Patient given [place] application               RESOLVED: Hip pain, chronic ICD-10-CM: M25.559, G89.29  ICD-9-CM: 719.45,  338.29  07/21/2014 - 01/29/2015               Medications reviewed  Current Facility-Administered Medications   Medication Dose Route Frequency   ??? albuterol-ipratropium (DUO-NEB) 2.5 MG-0.5 MG/3 ML  3 mL Nebulization Q6H RT   ??? predniSONE (DELTASONE) tablet 40 mg  40 mg Oral DAILY WITH BREAKFAST   ??? patiromer calcium sorbitex (VELTASSA) powder 8.4 g  8.4 g Oral DAILY   ??? acetylcysteine (MUCOMYST) 100 mg/mL (10 %) nebulizer solution 400 mg  4 mL Nebulization BID RT   ??? dilTIAZem CD (CARDIZEM CD) capsule 240 mg  240 mg Oral DAILY    ??? losartan (COZAAR) tablet 50 mg  50 mg Oral DAILY   ??? polyethylene glycol (MIRALAX) packet 17 g  17 g Oral BID PRN   ??? bisacodyL (DULCOLAX) suppository 10 mg  10 mg Rectal DAILY PRN   ??? guaiFENesin ER (MUCINEX) tablet 1,200 mg  1,200 mg Oral Q12H   ??? LORazepam (ATIVAN) injection 0.5 mg  0.5 mg IntraVENous Q6H PRN   ??? cetirizine (ZYRTEC) tablet 10 mg  10 mg Oral DAILY   ??? fluticasone propionate (FLONASE) 50 mcg/actuation nasal spray 2 Spray  2 Spray Both Nostrils BID   ??? HYDROcodone-acetaminophen (NORCO) 5-325 mg per tablet 1-2 Tab  1-2 Tab Oral Q4H PRN   ??? HYDROmorphone (DILAUDID) injection 0.5-1 mg  0.5-1 mg IntraVENous Q3H PRN   ??? docusate sodium (COLACE) capsule 100 mg  100 mg Oral BID   ??? hydrOXYzine HCL (ATARAX) tablet 25 mg  25 mg Oral TID PRN   ??? cloNIDine HCL (CATAPRES) tablet 0.3 mg  0.3 mg Oral BID   ??? QUEtiapine SR (SEROquel XR) tablet 150 mg  150 mg Oral QHS   ??? revefenacin (YUPELRI) nebulizer solution 175 mcg  175 mcg Nebulization DAILY   ??? dextromethorphan (DELSYM) 30 mg/5 mL syrup 30 mg  30 mg Oral Q12H PRN   ??? gabapentin (NEURONTIN) capsule 300 mg  300 mg Oral TID   ??? montelukast (SINGULAIR) tablet 10 mg  10 mg Oral QHS   ??? naloxone (NARCAN) injection 0.1 mg  0.1 mg IntraVENous PRN   ??? acetaminophen (TYLENOL) tablet 650 mg  650 mg Oral Q4H PRN    Or   ??? acetaminophen (TYLENOL) solution 650 mg  650 mg Oral Q4H PRN    Or   ??? acetaminophen (TYLENOL) suppository 650 mg  650 mg Rectal Q4H PRN   ??? HYDROcodone-acetaminophen (NORCO) 5-325 mg per tablet 1 Tab  1 Tab Oral Q4H PRN   ??? ondansetron (ZOFRAN) injection 4 mg  4 mg IntraVENous Q4H PRN   ??? heparin (porcine) injection 5,000 Units  5,000 Units SubCUTAneous Q8H   ??? arformoteroL (BROVANA) neb solution 15 mcg  15 mcg Nebulization BID RT   ??? budesonide (PULMICORT) 500 mcg/2 ml nebulizer suspension  500 mcg Nebulization BID RT   ??? dextrose (D50W) injection syrg 5-25 g  10-50 mL IntraVENous PRN   ??? glucagon (GLUCAGEN) injection 1 mg  1 mg IntraMUSCular PRN    ??? insulin glargine (LANTUS) injection 1-100 Units  1-100 Units SubCUTAneous QHS   ??? insulin lispro (HUMALOG) injection 1-100 Units  1-100 Units SubCUTAneous AC&HS   ??? insulin lispro (HUMALOG) injection 1-100 Units  1-100 Units SubCUTAneous PRN        Care Plan discussed with: pt     Total time spent with patient: 25 minutes.    Darrin Nipper, MD  September 02, 2018  1045

## 2018-09-02 NOTE — Progress Notes (Signed)
[facility] Progress Note    Patient: [name] [name] MRN: [id number]  SSN: [id number]    Date of Birth: 08/10/1950  Age: 68 y.o.  Sex: female      Admit Date: 08/22/2018    LOS: 11 days     Subjective:     Stable sob    Objective:     Vitals:    09/02/18 0026 09/02/18 0525 09/02/18 0828 09/02/18 0900   BP: 126/82 133/87 138/85    Pulse: 80 83     Resp: 20 21     Temp: 98.1 ??F (36.7 ??C) 97.6 ??F (36.4 ??C)     SpO2: 98% 99%  99%   Weight:       Height:            Intake and Output:  Current Shift: No intake/output data recorded.  Last three shifts: 03/20 1901 - 03/22 0700  In: 1735 [P.O.:1730]  Out: 2850 [Urine:2825]    Physical Exam:   LUNG: clear to auscultation bilaterally, diminished breath sounds     Lab/Data Review:  No results found.        Assessment:     Active Problems:    Respiratory failure with hypoxia (HCC) (08/22/2018)        Plan:     Ct in place no airleak  Will place CT to waterseal  Check chest xray in am    Yobani Schertzer L Marathon Cost, MD  September 02, 2018

## 2018-09-02 NOTE — Progress Notes (Signed)
Hospitalist Progress Note         [facility]    Daily Progress Note: 09/02/2018    Assessment/Plan:   1.  Acute hypoxic respiratory failure: she had an RRT called on her this morning because she went into acute hypoxic respiratory failure tachycardia and tachypnea.  She appeared to be quite tight with only minimal wheezing at the bases but she appeared to be quite anxious.  -We will need to transfer her to stepdown at this time I will increase her Solu-Medrol to-25 mg IV x1 and restart Solu-Medrol 60 mg IV every 6 hours I have increased her DuoNeb to every 4 hours and make albuterol nebulizer every 2 hours and started Mucomyst 40 mg every 6 hours and Mucinex 1200 mg twice daily as the patient does have a seasonal allergy component.  -Started Ativan 0.5 mg IV every 6 hours as needed for anxiety as the patient does have PTSD.  -We will check a chest x-ray.  -I discussed with nursing staff respiratory therapy and stepdown the patient will need to be transferred to stepdown at this time.  -We will check an ABG given her acute hypoxic change but the patient is alert and oriented x3 and mentating very well.  - will check a chest xray to make certain there isnt an infiltrate contributing to her distress.  - on 3/18; left-sided chest tube placed on 317 after chest x-ray was obtained.              -Appreciate Dr. Assunta Found assistance.              -Chest tube has been minimal bloody drainage at this time. -40 cm of suction.  Chest tube without air leak.            -Chest x-ray does show improvement but still small apical pneumothorax.     -3/22: repeat chest xray in am. Respiratory status appears to be improving.      -3/19; CT of thorax performed shows bullae and blebs.  There is mention of a 17 mm pulmonary left lung nodule.  No pneumothorax per CAT scan thorax.  -Clinically she feels better though.    -3/20: Chest x-ray still shows left pneumothorax: 10 to 15%.          : Therefore currently chest tube with  suction.  : Respiratory status remains stable.    - 3/21: Chest x-ray this morning showed no air leak no pneumothorax.              We will leave on suction per cardiothoracic surgery.              We will de-escalate Solu-Medrol to prednisone 40 mg once a day.    2.  Acute exacerbation COPD. Called earlier by RT who noted Pt had been changed to Prednisone  every day, resumed Solumedrol  q 8h.     -Minimal expiratory wheezing we will continue Solu-Medrol IV every 8 hours at this time .  -No wheezing at this time will de-escalate to prednisone 40 mg at this time.    3.  Acute bronchitis  4.  HTN, uncontrolled: Due to acute dyspnea.       -We will increase losartan to 50 mg and increase diltiazem to 240 mg from         180 mg.  5.  T2DM  6.  Left upper lobe lung nodule, will need continued follow-up   7.  Dyslipidemia  8.  PTSD, she was [place], has had respiratory problems since.  RN notes anxiety @ times. Try prn Hydroxyzine  9.  Peripheral neuropathy  10.  GERD.    11.  DJD  12.  DVT proph w sc Heparin  13.  Dispo: Still probably needs a couple of days  Subjective:   Interval history:    3/22: She denies any major dyspnea she states her cough is improving.  She think she is constipated but she states that she is on laxatives and stool softeners at this time.  She appears happy with her progress.    3/21: She appears comfortable and in no respiratory distress at this time.    3/20 Doing well . Only mild pleuritic chest pain.    3/19: She is lying in bed and states that she feels much better.  States her breathing is improved.  Appears to be quite happy with her progress.    Minimal drainage from chest tube.    3/18; she appears to be more comfortable today but obviously and still moderate amount of pain to the chest tube which is to be expected.  Overall respiratory status is improved.  She states that her anxiety is controlled at this time.  She appears quite optimistic as she should  be.      3/17: Rapid response team was called because she patient went into acute hypoxic respiratory failure and began to have tach tachycardia and tachypnea the patient had to be placed back on BiPAP at this time.  An ABG chest x-ray will be obtained we will have to escalate her nebulizer treatments.  We will need to move her to the stepdown at this time.  Respiratory is on board.  She does appear to be quite anxious and therefore gave Ativan 0.5 mg IV x1 and will make Ativan 0.5 mg IV every 6 hours as needed as patient does have a history of PTSD she was exposed to respiratory problems since we will treat started on 11.      Cardiovascular ROS: no chest pain ,  orthopnea  Gastrointestinal ROS: no abdominal pain, nausea,vomiting, diarrhea, melena or hematechezia  Genito-Urinary ROS: no dysuria, trouble voiding, or hematuria  Objective:   Physical Exam:     Visit Vitals  BP 138/85   Pulse 83   Temp 97.6 ??F (36.4 ??C)   Resp 21   Ht 5\' 8"  (1.727 m)   Wt 93.8 kg (206 lb 11.2 oz)   SpO2 99%   BMI 31.43 kg/m??    O2 Flow Rate (L/min): 2 l/min O2 Device: Room air    Temp (24hrs), Avg:98 ??F (36.7 ??C), Min:97.6 ??F (36.4 ??C), Max:98.4 ??F (36.9 ??C)    No intake/output data recorded.   03/20 1901 - 03/22 0700  In: 1735 [P.O.:1730]  Out: 2850 [Urine:2825]    General:  Alert, cooperative, no distress, appears stated age.   Lungs:    Still significant exp wheezes    :     Heart:  Regular rate and rhythm, S1, S2 normal, no murmur, click, rub or gallop.   Abdomen:   Soft, non-tender. Bowel sounds normal. No masses,  No organomegaly.   Extremities:  FROM, no LE edema   Pulses: 2+ and symmetric all extremities.   Skin: Skin color, texture, turgor normal. No rashes or lesions   :      Data Review:       24 Hour Results:  Recent Results (from  the past 24 hour(s))   GLUCOSE, POC    Collection Time: 09/01/18 12:12 PM   Result Value Ref Range    Glucose (POC) 248 (H) 65 - 105 mg/dL   GLUCOSE, POC    Collection Time: 09/01/18  5:16 PM    Result Value Ref Range    Glucose (POC) 270 (H) 65 - 105 mg/dL   GLUCOSE, POC    Collection Time: 09/01/18  9:50 PM   Result Value Ref Range    Glucose (POC) 179 (H) 65 - 105 mg/dL   RENAL FUNCTION PANEL    Collection Time: 09/02/18  6:42 AM   Result Value Ref Range    Sodium 132 (L) 136 - 145 mEq/L    Potassium 5.0 3.5 - 5.1 mEq/L    Chloride 99 98 - 107 mEq/L    CO2 28 21 - 32 mEq/L    Glucose 251 (H) 74 - 106 mg/dl    BUN 40 (H) 7 - 25 mg/dl    Creatinine 0.9 0.6 - 1.3 mg/dl    GFR est AA >16.1      GFR est non-AA >60      Calcium 8.3 (L) 8.5 - 10.1 mg/dl    Albumin 2.7 (L) 3.4 - 5.0 gm/dl    Phosphorus 2.5 2.5 - 4.9 mg/dl    Anion gap 6 5 - 15 mmol/L   GLUCOSE, POC    Collection Time: 09/02/18  8:31 AM   Result Value Ref Range    Glucose (POC) 207 (H) 65 - 105 mg/dL       Problem List:  Problem List as of 09/02/2018 Date Reviewed: Aug 25, 2018          Codes Class Noted - Resolved    Respiratory failure with hypoxia [facility]) ICD-10-CM: J96.91  ICD-9-CM: 518.81  08/22/2018 - Present        Headache ICD-10-CM: R51  ICD-9-CM: 784.0  08/05/2018 - Present        Abdominal pain ICD-10-CM: R10.9  ICD-9-CM: 789.00  02/04/2018 - Present        SIRS (systemic inflammatory response syndrome) (HCC) ICD-10-CM: R65.10  ICD-9-CM: 995.90  10/09/2017 - Present        Hyperkalemia ICD-10-CM: E87.5  ICD-9-CM: 276.7  09/08/2017 - Present        Obtunded ICD-10-CM: R40.1  ICD-9-CM: 780.09  09/08/2017 - Present        Acute renal failure (ARF) (HCC) ICD-10-CM: N17.9  ICD-9-CM: 584.9  09/08/2017 - Present        Septic shock (HCC) ICD-10-CM: A41.9, R65.21  ICD-9-CM: 038.9, 785.52, 995.92  09/08/2017 - Present        Metabolic encephalopathy ICD-10-CM: G93.41  ICD-9-CM: 348.31  09/08/2017 - Present        Dehydration ICD-10-CM: E86.0  ICD-9-CM: 276.51  08/07/2017 - Present        COPD with acute exacerbation (HCC) ICD-10-CM: J44.1  ICD-9-CM: 491.21  08/07/2017 - Present        Acute-on-chronic kidney injury (HCC) ICD-10-CM: N17.9, N18.9  ICD-9-CM:  584.9, 585.9  08/07/2017 - Present        Chest pain ICD-10-CM: R07.9  ICD-9-CM: 786.50  06/15/2017 - Present        Dyspnea ICD-10-CM: R06.00  ICD-9-CM: 786.09  06/15/2017 - Present        Type 2 diabetes mellitus with diabetic neuropathy (HCC) ICD-10-CM: E11.40  ICD-9-CM: 250.60, 357.2  12/07/2016 - Present        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9  ICD-9-CM: 038.9, 995.91  07/24/2016 - Present        Asthma exacerbation ICD-10-CM: J45.901  ICD-9-CM: 493.92  07/24/2016 - Present        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: E11.21  ICD-9-CM: 250.40, 583.81  06/12/2016 - Present        Sacroiliitis (HCC) ICD-10-CM: M46.1  ICD-9-CM: 720.2  11/25/2015 - Present        Essential hypertension ICD-10-CM: I10  ICD-9-CM: 401.9  10/06/2015 - Present        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  ICD-9-CM: 721.3  09/15/2015 - Present        Lumbar and sacral osteoarthritis ICD-10-CM: M47.817  ICD-9-CM: 721.3  09/15/2015 - Present        Chronic pain syndrome ICD-10-CM: G89.4  ICD-9-CM: 338.4  09/15/2015 - Present        Type 2 diabetes mellitus with hyperglycemia, without long-term current use of insulin (HCC) ICD-10-CM: E11.65  ICD-9-CM: 250.00, 790.29  08/20/2015 - Present        Acute colitis ICD-10-CM: K52.9  ICD-9-CM: 558.9  07/02/2015 - Present        Acute hyperglycemia ICD-10-CM:  R73.9  ICD-9-CM: 790.29  07/02/2015 - Present        Accelerated hypertension ICD-10-CM: I10  ICD-9-CM: 401.0  04/08/2015 - Present        Severe headache ICD-10-CM: R51  ICD-9-CM: 784.0  04/07/2015 - Present        Osteoarthritis of hips, 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  ICD-9-CM: 715.95  01/29/2015 - Present        PTSD (post-traumatic stress disorder) (Chronic) ICD-10-CM: F43.10  ICD-9-CM: 309.81  01/29/2015 - Present        Severe hypertension (Chronic) ICD-10-CM: I10  ICD-9-CM: 401.9  07/21/2014 - Present        RESOLVED: COPD exacerbation (HCC) ICD-10-CM: J44.1  ICD-9-CM: 491.21  11/14/2017 - 08/08/2018        RESOLVED: New onset type 1 diabetes mellitus, uncontrolled (HCC) ICD-10-CM: E10.65  ICD-9-CM: 250.03  07/02/2015 - 10/06/2015        RESOLVED: Recurrent major depressive disorder, in full remission (HCC) ICD-10-CM: F33.42  ICD-9-CM: 296.36  12/02/2014 - 12/02/2014        RESOLVED: Foreign body in colon ICD-10-CM: T18.4XXA  ICD-9-CM: 936  08/14/2014 - 01/29/2015        RESOLVED: Advanced care planning/counseling discussion ICD-10-CM: Z71.89  ICD-9-CM: V65.49  07/21/2014 - 01/29/2015    Overview Signed 07/21/2014  3:46 PM by Littie Deeds     Patient given [place] application               RESOLVED: Hip pain, chronic ICD-10-CM: M25.559, G89.29  ICD-9-CM: 719.45,  338.29  07/21/2014 - 01/29/2015               Medications reviewed  Current Facility-Administered Medications   Medication Dose Route Frequency   ??? albuterol-ipratropium (DUO-NEB) 2.5 MG-0.5 MG/3 ML  3 mL Nebulization Q6H RT   ??? predniSONE (DELTASONE) tablet 40 mg  40 mg Oral DAILY WITH BREAKFAST   ??? patiromer calcium sorbitex (VELTASSA) powder 8.4 g  8.4 g Oral DAILY   ??? acetylcysteine (MUCOMYST) 100 mg/mL (10 %) nebulizer solution 400 mg  4 mL Nebulization BID RT   ??? dilTIAZem CD (CARDIZEM CD) capsule 240 mg  240 mg Oral DAILY   ??? losartan (COZAAR) tablet 50 mg  50 mg Oral DAILY   ??? polyethylene glycol (MIRALAX) packet 17 g  17 g Oral BID PRN   ??? bisacodyL  (DULCOLAX) suppository 10 mg  10 mg Rectal DAILY PRN   ??? guaiFENesin ER (MUCINEX) tablet 1,200 mg  1,200 mg Oral Q12H   ??? LORazepam (ATIVAN) injection 0.5 mg  0.5 mg IntraVENous Q6H PRN   ??? cetirizine (ZYRTEC) tablet 10 mg  10 mg Oral DAILY   ??? fluticasone propionate (FLONASE) 50 mcg/actuation nasal spray 2 Spray  2 Spray Both Nostrils BID   ??? HYDROcodone-acetaminophen (NORCO) 5-325 mg per tablet 1-2 Tab  1-2 Tab Oral Q4H PRN   ??? HYDROmorphone (DILAUDID) injection 0.5-1 mg  0.5-1 mg IntraVENous Q3H PRN   ??? docusate sodium (COLACE) capsule 100 mg  100 mg Oral BID   ??? hydrOXYzine HCL (ATARAX) tablet 25 mg  25 mg Oral TID PRN   ??? cloNIDine HCL (CATAPRES) tablet 0.3 mg  0.3 mg Oral BID   ??? QUEtiapine SR (SEROquel XR) tablet 150 mg  150 mg Oral QHS   ??? revefenacin (YUPELRI) nebulizer solution 175 mcg  175 mcg Nebulization DAILY   ??? dextromethorphan (DELSYM) 30 mg/5 mL syrup 30 mg  30 mg Oral Q12H PRN   ??? gabapentin (NEURONTIN) capsule 300 mg  300 mg Oral TID   ??? montelukast (SINGULAIR) tablet 10 mg  10 mg Oral QHS   ??? naloxone (NARCAN) injection 0.1 mg  0.1 mg IntraVENous PRN   ??? acetaminophen (TYLENOL) tablet 650 mg  650 mg Oral Q4H PRN    Or   ??? acetaminophen (TYLENOL) solution 650 mg  650 mg Oral Q4H PRN    Or   ??? acetaminophen (TYLENOL) suppository 650 mg  650 mg Rectal Q4H PRN   ??? HYDROcodone-acetaminophen (NORCO) 5-325 mg per tablet 1 Tab  1 Tab Oral Q4H PRN   ??? ondansetron (ZOFRAN) injection 4 mg  4 mg IntraVENous Q4H PRN   ??? heparin (porcine) injection 5,000 Units  5,000 Units SubCUTAneous Q8H   ??? arformoteroL (BROVANA) neb solution 15 mcg  15 mcg Nebulization BID RT   ??? budesonide (PULMICORT) 500 mcg/2 ml nebulizer suspension  500 mcg Nebulization BID RT   ??? dextrose (D50W) injection syrg 5-25 g  10-50 mL IntraVENous PRN   ??? glucagon (GLUCAGEN) injection 1 mg  1 mg IntraMUSCular PRN   ??? insulin glargine (LANTUS) injection 1-100 Units  1-100 Units SubCUTAneous QHS   ??? insulin lispro (HUMALOG) injection 1-100  Units  1-100 Units SubCUTAneous AC&HS   ??? insulin lispro (HUMALOG) injection 1-100 Units  1-100 Units SubCUTAneous PRN        Care Plan discussed with: pt     Total time spent with patient: 25 minutes.    Darrin Nipper, MD  September 02, 2018  1045

## 2018-09-02 NOTE — Progress Notes (Signed)
[facility] Progress Note    Patient: [name] [name] MRN: [id number]  SSN: [id number]    Date of Birth: 1950/12/15  Age: 68 y.o.  Sex: female      Admit Date: 08/22/2018    LOS: 11 days     Subjective:     Stable sob    Objective:     Vitals:    09/02/18 0026 09/02/18 0525 09/02/18 0828 09/02/18 0900   BP: 126/82 133/87 138/85    Pulse: 80 83     Resp: 20 21     Temp: 98.1 ??F (36.7 ??C) 97.6 ??F (36.4 ??C)     SpO2: 98% 99%  99%   Weight:       Height:            Intake and Output:  Current Shift: No intake/output data recorded.  Last three shifts: 03/20 1901 - 03/22 0700  In: 1735 [P.O.:1730]  Out: 2850 [Urine:2825]    Physical Exam:   LUNG: clear to auscultation bilaterally, diminished breath sounds     Lab/Data Review:  No results found.        Assessment:     Active Problems:    Respiratory failure with hypoxia (HCC) (08/22/2018)        Plan:     Ct in place no airleak  Will place CT to waterseal  Check chest xray in am    Philis Pique, MD  September 02, 2018

## 2018-09-03 ENCOUNTER — Inpatient Hospital Stay: Admit: 2018-09-03 | Payer: MEDICARE | Primary: Physician Assistant

## 2018-09-03 LAB — RENAL FUNCTION PANEL
Albumin: 2.7 gm/dl — ABNORMAL LOW (ref 3.4–5.0)
Albumin: 2.7 gm/dl — ABNORMAL LOW (ref 3.4–5.0)
Anion Gap: 5 mmol/L (ref 5–15)
Anion gap: 5 mmol/L (ref 5–15)
BUN: 35 mg/dl — ABNORMAL HIGH (ref 7–25)
BUN: 35 mg/dl — ABNORMAL HIGH (ref 7–25)
CO2: 29 mEq/L (ref 21–32)
CO2: 29 mEq/L (ref 21–32)
Calcium: 8.7 mg/dl (ref 8.5–10.1)
Calcium: 8.7 mg/dl (ref 8.5–10.1)
Chloride: 101 mEq/L (ref 98–107)
Chloride: 101 mEq/L (ref 98–107)
Creatinine: 0.8 mg/dl (ref 0.6–1.3)
Creatinine: 0.8 mg/dl (ref 0.6–1.3)
EGFR IF NonAfrican American: >60
GFR African American: >60
GFR est AA: >60
GFR est non-AA: >60
Glucose: 165 mg/dl — ABNORMAL HIGH (ref 74–106)
Glucose: 165 mg/dl — ABNORMAL HIGH (ref 74–106)
Phosphorus: 2.8 mg/dl (ref 2.5–4.9)
Phosphorus: 2.8 mg/dl (ref 2.5–4.9)
Potassium: 4.9 mEq/L (ref 3.5–5.1)
Potassium: 4.9 mEq/L (ref 3.5–5.1)
Sodium: 134 mEq/L — ABNORMAL LOW (ref 136–145)
Sodium: 134 mEq/L — ABNORMAL LOW (ref 136–145)

## 2018-09-03 LAB — POCT GLUCOSE
POC Glucose: 93 mg/dL (ref 65–105)
POC Glucose: 129 mg/dL — ABNORMAL HIGH (ref 65–105)
POC Glucose: 267 mg/dL — ABNORMAL HIGH (ref 65–105)
POC Glucose: 172 mg/dL — ABNORMAL HIGH (ref 65–105)

## 2018-09-03 LAB — GLUCOSE, POC
Glucose (POC): 93 mg/dL (ref 65–105)
Glucose (POC): 129 mg/dL — ABNORMAL HIGH (ref 65–105)
Glucose (POC): 267 mg/dL — ABNORMAL HIGH (ref 65–105)
Glucose (POC): 172 mg/dL — ABNORMAL HIGH (ref 65–105)

## 2018-09-03 MED FILL — HYDROCODONE-ACETAMINOPHEN 5 MG-325 MG TAB: 5-325 mg | ORAL | Qty: 2

## 2018-09-03 MED FILL — ACETYLCYSTEINE 10 % (100 MG/ML) SOLN: 100 mg/mL (10 %) | Qty: 4

## 2018-09-03 MED FILL — HYDROMORPHONE 1 MG/ML INJECTION SOLUTION: 1 mg/mL | INTRAMUSCULAR | Qty: 1

## 2018-09-03 MED FILL — BROVANA 15 MCG/2 ML SOLUTION FOR NEBULIZATION: 15 mcg/2 mL | RESPIRATORY_TRACT | Qty: 1

## 2018-09-03 MED FILL — CLONIDINE 0.1 MG TAB: 0.1 mg | ORAL | Qty: 3

## 2018-09-03 MED FILL — CETIRIZINE 5 MG TAB: 5 mg | ORAL | Qty: 2

## 2018-09-03 MED FILL — MONTELUKAST 10 MG TAB: 10 mg | ORAL | Qty: 1

## 2018-09-03 MED FILL — DILTIAZEM ER 120 MG 24 HR CAP: 120 mg | ORAL | Qty: 2

## 2018-09-03 MED FILL — LOSARTAN 50 MG TAB: 50 mg | ORAL | Qty: 1

## 2018-09-03 MED FILL — HYDROCODONE-ACETAMINOPHEN 5 MG-325 MG TAB: 5-325 mg | ORAL | Qty: 1

## 2018-09-03 MED FILL — IPRATROPIUM-ALBUTEROL 2.5 MG-0.5 MG/3 ML NEB SOLUTION: 2.5 mg-0.5 mg/3 ml | RESPIRATORY_TRACT | Qty: 3

## 2018-09-03 MED FILL — VELTASSA 8.4 GRAM ORAL POWDER PACKET: 8.4 gram | ORAL | Qty: 1

## 2018-09-03 MED FILL — PREDNISONE 20 MG TAB: 20 mg | ORAL | Qty: 2

## 2018-09-03 MED FILL — QUETIAPINE SR 50 MG 24 HR TAB: 50 mg | ORAL | Qty: 3

## 2018-09-03 MED FILL — YUPELRI 175 MCG/3 ML SOLUTION FOR NEBULIZATION: 175 mcg/3 mL | RESPIRATORY_TRACT | Qty: 1

## 2018-09-03 MED FILL — HEPARIN (PORCINE) 5,000 UNIT/ML IJ SOLN: 5000 unit/mL | INTRAMUSCULAR | Qty: 1

## 2018-09-03 MED FILL — MUCINEX 600 MG TABLET, EXTENDED RELEASE: 600 mg | ORAL | Qty: 2

## 2018-09-03 MED FILL — DOCUSATE SODIUM 100 MG CAP: 100 mg | ORAL | Qty: 1

## 2018-09-03 MED FILL — BUDESONIDE 0.5 MG/2 ML NEB SUSPENSION: 0.5 mg/2 mL | RESPIRATORY_TRACT | Qty: 1

## 2018-09-03 MED FILL — GABAPENTIN 300 MG CAP: 300 mg | ORAL | Qty: 1

## 2018-09-03 NOTE — Progress Notes (Signed)
Pt refused O2 evaluation stating "'I'm not getting up walking anywhere". RT will make another attempt later.

## 2018-09-03 NOTE — Other (Signed)
Bedside and Verbal shift change report given to Katerina, RN (oncoming nurse) by Edith, RN (offgoing nurse). Report included the following information SBAR and Kardex.

## 2018-09-03 NOTE — Progress Notes (Signed)
Hospitalist Progress Note         [facility]    Daily Progress Note: 09/03/2018    Assessment/Plan:   1.  Acute hypoxic respiratory failure: she had an RRT called on her this morning because she went into acute hypoxic respiratory failure tachycardia and tachypnea.  She appeared to be quite tight with only minimal wheezing at the bases but she appeared to be quite anxious.  -We will need to transfer her to stepdown at this time I will increase her Solu-Medrol to-25 mg IV x1 and restart Solu-Medrol 60 mg IV every 6 hours I have increased her DuoNeb to every 4 hours and make albuterol nebulizer every 2 hours and started Mucomyst 40 mg every 6 hours and Mucinex 1200 mg twice daily as the patient does have a seasonal allergy component.  -Started Ativan 0.5 mg IV every 6 hours as needed for anxiety as the patient does have PTSD.  -We will check a chest x-ray.  -I discussed with nursing staff respiratory therapy and stepdown the patient will need to be transferred to stepdown at this time.  -We will check an ABG given her acute hypoxic change but the patient is alert and oriented x3 and mentating very well.  - will check a chest xray to make certain there isnt an infiltrate contributing to her distress.  - on 3/18; left-sided chest tube placed on 317 after chest x-ray was obtained.              -Appreciate Dr. Assunta Found assistance.              -Chest tube has been minimal bloody drainage at this time. -40 cm of suction.  Chest tube without air leak.            -Chest x-ray does show improvement but still small apical pneumothorax.     -3/22: repeat chest xray in am. Respiratory status appears to be improving.  - 3/23: Chest tube has been removed. I will do a six minute walk test today to              See if she requires oxygen.   -  Will consult pt/ot also.    -3/19; CT of thorax performed shows bullae and blebs.  There is mention of a 17 mm pulmonary left lung nodule.  No pneumothorax per CAT scan thorax.   -Clinically she feels better though.    -3/20: Chest x-ray still shows left pneumothorax: 10 to 15%.          : Therefore currently chest tube with suction.  : Respiratory status remains stable.    - 3/21: Chest x-ray this morning showed no air leak no pneumothorax.              We will leave on suction per cardiothoracic surgery.              We will de-escalate Solu-Medrol to prednisone 40 mg once a day.    2.  Acute exacerbation COPD. Called earlier by RT who noted Pt had been changed to Prednisone  every day, resumed Solumedrol  q 8h.     -Minimal expiratory wheezing we will continue Solu-Medrol IV every 8 hours at this time .  -No wheezing at this time will de-escalate to prednisone 40 mg at this time.    3.  Acute bronchitis  4.  HTN, uncontrolled: Due to acute dyspnea.       -We will increase losartan to 50  mg and increase diltiazem to 240 mg from         180 mg.  5.  T2DM  6.  Left upper lobe lung nodule, will need continued follow-up   7.  Dyslipidemia  8.  PTSD, she was in [place] on [place], has had respiratory problems since.  RN notes anxiety @ times. Try prn Hydroxyzine  9.  Peripheral neuropathy  10.  GERD.    11.  DJD  12.  DVT proph w sc Heparin  13.  Dispo: Still probably needs a couple of days  Subjective:   Interval history:    3/23: she is quite anxious and tearful. She has much upper airway congestion.           I told her she needs to use flutter valve more often.     3/22: She denies any major dyspnea she states her cough is improving.  She think she is constipated but she states that she is on laxatives and stool softeners at this time.  She appears happy with her progress.    3/21: She appears comfortable and in no respiratory distress at this time.    3/20 Doing well . Only mild pleuritic chest pain.    3/19: She is lying in bed and states that she feels much better.  States her breathing is improved.  Appears to be quite happy with her progress.     Minimal drainage from chest tube.    3/18; she appears to be more comfortable today but obviously and still moderate amount of pain to the chest tube which is to be expected.  Overall respiratory status is improved.  She states that her anxiety is controlled at this time.  She appears quite optimistic as she should be.      3/17: Rapid response team was called because she patient went into acute hypoxic respiratory failure and began to have tach tachycardia and tachypnea the patient had to be placed back on BiPAP at this time.  An ABG chest x-ray will be obtained we will have to escalate her nebulizer treatments.  We will need to move her to the stepdown at this time.  Respiratory is on board.  She does appear to be quite anxious and therefore gave Ativan 0.5 mg IV x1 and will make Ativan 0.5 mg IV every 6 hours as needed as patient does have a history of PTSD she was exposed to respiratory problems since we will treat started on 11.      Cardiovascular ROS: no chest pain ,  orthopnea  Gastrointestinal ROS: no abdominal pain, nausea,vomiting, diarrhea, melena or hematechezia  Genito-Urinary ROS: no dysuria, trouble voiding, or hematuria  Objective:   Physical Exam:     Visit Vitals  BP 123/78 (BP 1 Location: Right arm, BP Patient Position: Supine)   Pulse 93   Temp 98.8 ??F (37.1 ??C)   Resp 20   Ht 5\' 8"  (1.727 m)   Wt 93.8 kg (206 lb 11.2 oz)   SpO2 98%   BMI 31.43 kg/m??    O2 Flow Rate (L/min): 2 l/min O2 Device: Nasal cannula    Temp (24hrs), Avg:98.5 ??F (36.9 ??C), Min:98 ??F (36.7 ??C), Max:99 ??F (37.2 ??C)    03/23 0701 - 03/23 1900  In: 600 [P.O.:600]  Out: -    03/21 1901 - 03/23 0700  In: 1670 [P.O.:1670]  Out: 2317 [Urine:2300]    General:  Alert, cooperative, no distress, appears stated age.  Lungs:    Still significant exp wheezes    :     Heart:  Regular rate and rhythm, S1, S2 normal, no murmur, click, rub or gallop.   Abdomen:   Soft, non-tender. Bowel sounds normal. No masses,  No organomegaly.    Extremities:  FROM, no LE edema   Pulses: 2+ and symmetric all extremities.   Skin: Skin color, texture, turgor normal. No rashes or lesions   :      Data Review:       24 Hour Results:  Recent Results (from the past 24 hour(s))   GLUCOSE, POC    Collection Time: 09/02/18  5:26 PM   Result Value Ref Range    Glucose (POC) 286 (H) 65 - 105 mg/dL   GLUCOSE, POC    Collection Time: 09/02/18  9:46 PM   Result Value Ref Range    Glucose (POC) 93 65 - 105 mg/dL   RENAL FUNCTION PANEL    Collection Time: 09/03/18  6:10 AM   Result Value Ref Range    Sodium 134 (L) 136 - 145 mEq/L    Potassium 4.9 3.5 - 5.1 mEq/L    Chloride 101 98 - 107 mEq/L    CO2 29 21 - 32 mEq/L    Glucose 165 (H) 74 - 106 mg/dl    BUN 35 (H) 7 - 25 mg/dl    Creatinine 0.8 0.6 - 1.3 mg/dl    GFR est AA >16.1      GFR est non-AA >60      Calcium 8.7 8.5 - 10.1 mg/dl    Albumin 2.7 (L) 3.4 - 5.0 gm/dl    Phosphorus 2.8 2.5 - 4.9 mg/dl    Anion gap 5 5 - 15 mmol/L   GLUCOSE, POC    Collection Time: 09/03/18  8:20 AM   Result Value Ref Range    Glucose (POC) 129 (H) 65 - 105 mg/dL   GLUCOSE, POC    Collection Time: 09/03/18 11:27 AM   Result Value Ref Range    Glucose (POC) 267 (H) 65 - 105 mg/dL       Problem List:  Problem List as of 09/03/2018 Date Reviewed: 09-04-2018          Codes Class Noted - Resolved    Respiratory failure with hypoxia [facility]) ICD-10-CM: J96.91  ICD-9-CM: 518.81  08/22/2018 - Present        Headache ICD-10-CM: R51  ICD-9-CM: 784.0  08/05/2018 - Present        Abdominal pain ICD-10-CM: R10.9  ICD-9-CM: 789.00  02/04/2018 - Present        SIRS (systemic inflammatory response syndrome) (HCC) ICD-10-CM: R65.10  ICD-9-CM: 995.90  10/09/2017 - Present        Hyperkalemia ICD-10-CM: E87.5  ICD-9-CM: 276.7  09/08/2017 - Present        Obtunded ICD-10-CM: R40.1  ICD-9-CM: 780.09  09/08/2017 - Present        Acute renal failure (ARF) (HCC) ICD-10-CM: N17.9  ICD-9-CM: 584.9  09/08/2017 - Present        Septic shock (HCC) ICD-10-CM: A41.9, R65.21   ICD-9-CM: 038.9, 785.52, 995.92  09/08/2017 - Present        Metabolic encephalopathy ICD-10-CM: G93.41  ICD-9-CM: 348.31  09/08/2017 - Present        Dehydration ICD-10-CM: E86.0  ICD-9-CM: 276.51  08/07/2017 - Present        COPD with acute exacerbation (HCC) ICD-10-CM: J44.1  ICD-9-CM: 491.21  08/07/2017 -  Present        Acute-on-chronic kidney injury [facility]) ICD-10-CM: N17.9, N18.9  ICD-9-CM: 584.9, 585.9  08/07/2017 - Present        Chest pain ICD-10-CM: R07.9  ICD-9-CM: 786.50  06/15/2017 - Present        Dyspnea ICD-10-CM: R06.00  ICD-9-CM: 786.09  06/15/2017 - Present        Type 2 diabetes mellitus with diabetic neuropathy (HCC) ICD-10-CM: E11.40  ICD-9-CM: 250.60, 357.2  12/07/2016 - Present        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9  ICD-9-CM: 038.9, 995.91  07/24/2016 - Present        Asthma exacerbation ICD-10-CM: J45.901  ICD-9-CM: 493.92  07/24/2016 - Present        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: E11.21  ICD-9-CM: 250.40, 583.81  06/12/2016 - Present        Sacroiliitis (HCC) ICD-10-CM: M46.1  ICD-9-CM: 720.2  11/25/2015 - Present        Essential hypertension ICD-10-CM: I10  ICD-9-CM: 401.9  10/06/2015 - Present        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  ICD-9-CM: 721.3  09/15/2015 - Present        Lumbar and sacral osteoarthritis ICD-10-CM: M47.817   ICD-9-CM: 721.3  09/15/2015 - Present        Chronic pain syndrome ICD-10-CM: G89.4  ICD-9-CM: 338.4  09/15/2015 - Present        Type 2 diabetes mellitus with hyperglycemia, without long-term current use of insulin (HCC) ICD-10-CM: E11.65  ICD-9-CM: 250.00, 790.29  08/20/2015 - Present        Acute colitis ICD-10-CM: K52.9  ICD-9-CM: 558.9  07/02/2015 - Present        Acute hyperglycemia ICD-10-CM: R73.9  ICD-9-CM: 790.29  07/02/2015 - Present        Accelerated hypertension ICD-10-CM: I10  ICD-9-CM: 401.0  04/08/2015 - Present        Severe headache ICD-10-CM: R51  ICD-9-CM: 784.0  04/07/2015 - Present        Osteoarthritis of hips, 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  ICD-9-CM: 715.95  01/29/2015 - Present        PTSD (post-traumatic stress disorder) (Chronic) ICD-10-CM: F43.10  ICD-9-CM: 309.81  01/29/2015 - Present        Severe hypertension (Chronic) ICD-10-CM: I10  ICD-9-CM: 401.9  07/21/2014 - Present        RESOLVED: COPD exacerbation (HCC) ICD-10-CM: J44.1  ICD-9-CM: 491.21  11/14/2017 - 08/08/2018        RESOLVED: New onset type 1 diabetes mellitus, uncontrolled (HCC) ICD-10-CM: E10.65  ICD-9-CM: 250.03  07/02/2015 - 10/06/2015        RESOLVED: Recurrent major depressive disorder, in full remission (HCC) ICD-10-CM: F33.42  ICD-9-CM: 296.36  12/02/2014 - 12/02/2014        RESOLVED: Foreign body in colon ICD-10-CM: T18.4XXA  ICD-9-CM: 936  08/14/2014 - 01/29/2015        RESOLVED: Advanced care planning/counseling discussion ICD-10-CM: Z71.89  ICD-9-CM: V65.49  07/21/2014 - 01/29/2015    Overview Signed 07/21/2014  3:46 PM by Littie Deeds     Patient given [place] application               RESOLVED: Hip pain, chronic ICD-10-CM: M25.559, G89.29  ICD-9-CM: 719.45, 338.29  07/21/2014 - 01/29/2015               Medications reviewed  Current Facility-Administered Medications   Medication Dose Route Frequency   ??? albuterol-ipratropium (DUO-NEB) 2.5 MG-0.5 MG/3 ML  3 mL Nebulization Q6H RT    ??? predniSONE (DELTASONE) tablet 40 mg  40 mg Oral DAILY WITH BREAKFAST   ??? patiromer calcium sorbitex (VELTASSA) powder 8.4 g  8.4 g Oral DAILY   ??? acetylcysteine (MUCOMYST) 100 mg/mL (10 %) nebulizer solution 400 mg  4 mL Nebulization BID RT   ??? dilTIAZem CD (CARDIZEM CD) capsule 240 mg  240 mg Oral DAILY   ??? losartan (COZAAR) tablet 50 mg  50 mg Oral DAILY   ??? polyethylene glycol (MIRALAX) packet 17 g  17 g Oral BID PRN   ??? bisacodyL (DULCOLAX) suppository 10 mg  10 mg Rectal DAILY PRN   ??? guaiFENesin ER (MUCINEX) tablet 1,200 mg  1,200 mg Oral Q12H   ??? LORazepam (ATIVAN) injection 0.5 mg  0.5 mg IntraVENous Q6H PRN   ??? cetirizine (ZYRTEC) tablet 10 mg  10 mg Oral DAILY   ??? fluticasone propionate (FLONASE) 50 mcg/actuation nasal spray 2 Spray  2 Spray Both Nostrils BID   ??? HYDROcodone-acetaminophen (NORCO) 5-325 mg per tablet 1-2 Tab  1-2 Tab Oral Q4H PRN   ??? HYDROmorphone (DILAUDID) injection 0.5-1 mg  0.5-1 mg IntraVENous Q3H PRN   ??? docusate sodium (COLACE) capsule 100 mg  100 mg Oral BID   ??? hydrOXYzine HCL (ATARAX) tablet 25 mg  25 mg Oral TID PRN   ??? cloNIDine HCL (CATAPRES) tablet 0.3 mg  0.3 mg Oral BID   ??? QUEtiapine SR (SEROquel XR) tablet 150 mg  150 mg Oral QHS   ??? revefenacin (YUPELRI) nebulizer solution 175 mcg  175 mcg Nebulization DAILY   ??? dextromethorphan (DELSYM) 30 mg/5 mL syrup 30 mg  30 mg Oral Q12H PRN   ??? gabapentin (NEURONTIN) capsule 300 mg  300 mg Oral TID   ??? montelukast (SINGULAIR) tablet 10 mg  10 mg Oral QHS   ??? naloxone (NARCAN) injection 0.1 mg  0.1 mg IntraVENous PRN   ??? acetaminophen (TYLENOL) tablet 650 mg  650 mg Oral Q4H PRN    Or   ??? acetaminophen (TYLENOL) solution 650 mg  650 mg Oral Q4H PRN    Or   ??? acetaminophen (TYLENOL) suppository 650 mg  650 mg Rectal Q4H PRN   ??? ondansetron (ZOFRAN) injection 4 mg  4 mg IntraVENous Q4H PRN   ??? heparin (porcine) injection 5,000 Units  5,000 Units SubCUTAneous Q8H    ??? arformoteroL (BROVANA) neb solution 15 mcg  15 mcg Nebulization BID RT   ??? budesonide (PULMICORT) 500 mcg/2 ml nebulizer suspension  500 mcg Nebulization BID RT   ??? dextrose (D50W) injection syrg 5-25 g  10-50 mL IntraVENous PRN   ??? glucagon (GLUCAGEN) injection 1 mg  1 mg  IntraMUSCular PRN   ??? insulin glargine (LANTUS) injection 1-100 Units  1-100 Units SubCUTAneous QHS   ??? insulin lispro (HUMALOG) injection 1-100 Units  1-100 Units SubCUTAneous AC&HS   ??? insulin lispro (HUMALOG) injection 1-100 Units  1-100 Units SubCUTAneous PRN        Care Plan discussed with: pt     Total time spent with patient: 25 minutes.    Darrin Nipper, MD  September 03, 2018  1045

## 2018-09-03 NOTE — Progress Notes (Signed)
PHYSICAL THERAPY MISSED SESSION        Patient: [name] [name] (68 y.o. female)  Room: 5110/5110    Date: 09/03/2018  Time:  1015  Primary Diagnosis: Respiratory failure with hypoxia (HCC) [J96.91]    Chart reviewed and discussed with nsg.    OBJECTIVE:  Patient was not seen for skilled physical therapy treatment, secondary to Pt. declined and c/o too much pain.    Comments: pt declined PT due to pain    PLAN: Will follow up as patient's condition and/or schedule permits.        Fatisha Rabalais Wayne Wynona Duhamel, PT  Pager:

## 2018-09-03 NOTE — Other (Signed)
Bedside and Verbal shift change report given to Edith Njokah, RN (oncoming nurse) by Nora RN (offgoing nurse). Report included the following information SBAR and Kardex.

## 2018-09-03 NOTE — Progress Notes (Signed)
SURGERY PROGRESS NOTE  Progress Note    Patient: [name] [name]               Sex: female          DOA: 08/22/2018       Date of Birth:  May 18, 1951      Age:  68 y.o.        LOS:  LOS: 12 days     Chart reviewed.    Assessment and Plan:   Ms. Rollin is a 68 y.o. year old female s/p chest tube insertion for spontaneous pneumothorax on 3/17.     CXR this AM shows possible stable apical space.  Chest tube removed today.     Plan:  1. Ct removed today.  Stable for discharge from a surgical standpoint.   2.  Pt may remove dressing in 3 days.  No need for office f/u.  She will call the office with any concerns.     Problem List:  Active Problems:    Respiratory failure with hypoxia (HCC) (08/22/2018)             Objective:      Vital Signs:  Visit Vitals  BP 134/90 (BP 1 Location: Right arm, BP Patient Position: Supine)   Pulse 86   Temp 98.3 ??F (36.8 ??C)   Resp 22   Ht 5\' 8"  (1.727 m)   Wt 93.8 kg (206 lb 11.2 oz)   SpO2 98%   BMI 31.43 kg/m??     Temp (24hrs), Avg:98.4 ??F (36.9 ??C), Min:98 ??F (36.7 ??C), Max:99 ??F (37.2 ??C)    Admission Weight: Last Weight   Weight: 85.3 kg (188 lb) Weight: 93.8 kg (206 lb 11.2 oz)     Intake and Output:  Last three shifts:  03/21 1901 - 03/23 0700  In: 1670 [P.O.:1670]  Out: 2317 [Urine:2300]    Lab Results:  Lab Results   Component Value Date/Time    WBC 19.4 (H) 08/29/2018 06:23 AM    HCT 42.8 08/29/2018 06:23 AM    PLT 266 08/29/2018 06:23 AM      Lab Results   Component Value Date/Time    NA 134 (L) 09/03/2018 06:10 AM    K 4.9 09/03/2018 06:10 AM    CL 101 09/03/2018 06:10 AM    CO2 29 09/03/2018 06:10 AM    GLU 165 (H) 09/03/2018 06:10 AM    BUN 35 (H) 09/03/2018 06:10 AM    CREA 0.8 09/03/2018 06:10 AM    CREA 0.9 09/02/2018 06:42 AM    CREA 1.0 09/01/2018 06:59 AM       Medications:  Current Facility-Administered Medications   Medication Dose Route Frequency   ??? albuterol-ipratropium (DUO-NEB) 2.5 MG-0.5 MG/3 ML  3 mL Nebulization Q6H RT    ??? predniSONE (DELTASONE) tablet 40 mg  40 mg Oral DAILY WITH BREAKFAST   ??? patiromer calcium sorbitex (VELTASSA) powder 8.4 g  8.4 g Oral DAILY   ??? acetylcysteine (MUCOMYST) 100 mg/mL (10 %) nebulizer solution 400 mg  4 mL Nebulization BID RT   ??? dilTIAZem CD (CARDIZEM CD) capsule 240 mg  240 mg Oral DAILY   ??? losartan (COZAAR) tablet 50 mg  50 mg Oral DAILY   ??? polyethylene glycol (MIRALAX) packet 17 g  17 g Oral BID PRN   ??? bisacodyL (DULCOLAX) suppository 10 mg  10 mg Rectal DAILY PRN   ??? guaiFENesin ER (MUCINEX) tablet 1,200 mg  1,200 mg Oral Q12H   ???  LORazepam (ATIVAN) injection 0.5 mg  0.5 mg IntraVENous Q6H PRN   ??? cetirizine (ZYRTEC) tablet 10 mg  10 mg Oral DAILY   ??? fluticasone propionate (FLONASE) 50 mcg/actuation nasal spray 2 Spray  2 Spray Both Nostrils BID   ??? HYDROcodone-acetaminophen (NORCO) 5-325 mg per tablet 1-2 Tab  1-2 Tab Oral Q4H PRN   ??? HYDROmorphone (DILAUDID) injection 0.5-1 mg  0.5-1 mg IntraVENous Q3H PRN   ??? docusate sodium (COLACE) capsule 100 mg  100 mg Oral BID   ??? hydrOXYzine HCL (ATARAX) tablet 25 mg  25 mg Oral TID PRN   ??? cloNIDine HCL (CATAPRES) tablet 0.3 mg  0.3 mg Oral BID   ??? QUEtiapine SR (SEROquel XR) tablet 150 mg  150 mg Oral QHS   ??? revefenacin (YUPELRI) nebulizer solution 175 mcg  175 mcg Nebulization DAILY   ??? dextromethorphan (DELSYM) 30 mg/5 mL syrup 30 mg  30 mg Oral Q12H PRN   ??? gabapentin (NEURONTIN) capsule 300 mg  300 mg Oral TID   ??? montelukast (SINGULAIR) tablet 10 mg  10 mg Oral QHS   ??? naloxone (NARCAN) injection 0.1 mg  0.1 mg IntraVENous PRN   ??? acetaminophen (TYLENOL) tablet 650 mg  650 mg Oral Q4H PRN    Or   ??? acetaminophen (TYLENOL) solution 650 mg  650 mg Oral Q4H PRN    Or   ??? acetaminophen (TYLENOL) suppository 650 mg  650 mg Rectal Q4H PRN   ??? HYDROcodone-acetaminophen (NORCO) 5-325 mg per tablet 1 Tab  1 Tab Oral Q4H PRN   ??? ondansetron (ZOFRAN) injection 4 mg  4 mg IntraVENous Q4H PRN    ??? heparin (porcine) injection 5,000 Units  5,000 Units SubCUTAneous Q8H   ??? arformoteroL (BROVANA) neb solution 15 mcg  15 mcg Nebulization BID RT   ??? budesonide (PULMICORT) 500 mcg/2 ml nebulizer suspension  500 mcg Nebulization BID RT   ??? dextrose (D50W) injection syrg 5-25 g  10-50 mL IntraVENous PRN   ??? glucagon (GLUCAGEN) injection 1 mg  1 mg IntraMUSCular PRN   ??? insulin glargine (LANTUS) injection 1-100 Units  1-100 Units SubCUTAneous QHS   ??? insulin lispro (HUMALOG) injection 1-100 Units  1-100 Units SubCUTAneous AC&HS   ??? insulin lispro (HUMALOG) injection 1-100 Units  1-100 Units SubCUTAneous PRN         Mariam Dollar, PA-C  September 03, 2018  9:23 AM

## 2018-09-03 NOTE — Progress Notes (Signed)
Problem: Falls - Risk of  Goal: *Absence of Falls  Description: Document Schmid Fall Risk and appropriate interventions in the flowsheet.  Outcome: Progressing Towards Goal  Note: Fall Risk Interventions:  Mobility Interventions: Assess mobility with egress test, Bed/chair exit alarm, Patient to call before getting OOB         Medication Interventions: Assess postural VS orthostatic hypotension, Bed/chair exit alarm    Elimination Interventions: Bed/chair exit alarm, Call light in reach, Toileting schedule/hourly rounds              Problem: Gas Exchange - Impaired  Goal: *Absence of hypoxia  Outcome: Progressing Towards Goal

## 2018-09-03 NOTE — Progress Notes (Signed)
 Pt refused O2 evaluation stating 'I'm not getting up walking anywhere. RT will make another attempt later.

## 2018-09-03 NOTE — Progress Notes (Signed)
Problem: Falls - Risk of  Goal: *Absence of Falls  Description: Document [name] [name] Fall Risk and appropriate interventions in the flowsheet.  Outcome: Progressing Towards Goal  Note: Fall Risk Interventions:  Mobility Interventions: Assess mobility with egress test, Bed/chair exit alarm, Patient to call before getting OOB         Medication Interventions: Assess postural VS orthostatic hypotension, Bed/chair exit alarm    Elimination Interventions: Bed/chair exit alarm, Call light in reach, Toileting schedule/hourly rounds              Problem: Gas Exchange - Impaired  Goal: *Absence of hypoxia  Outcome: Progressing Towards Goal

## 2018-09-03 NOTE — Progress Notes (Signed)
Hospitalist Progress Note         [facility]    Daily Progress Note: 09/03/2018    Assessment/Plan:   1.  Acute hypoxic respiratory failure: she had an RRT called on her this morning because she went into acute hypoxic respiratory failure tachycardia and tachypnea.  She appeared to be quite tight with only minimal wheezing at the bases but she appeared to be quite anxious.  -We will need to transfer her to stepdown at this time I will increase her Solu-Medrol to-25 mg IV x1 and restart Solu-Medrol 60 mg IV every 6 hours I have increased her DuoNeb to every 4 hours and make albuterol nebulizer every 2 hours and started Mucomyst 40 mg every 6 hours and Mucinex 1200 mg twice daily as the patient does have a seasonal allergy component.  -Started Ativan 0.5 mg IV every 6 hours as needed for anxiety as the patient does have PTSD.  -We will check a chest x-ray.  -I discussed with nursing staff respiratory therapy and stepdown the patient will need to be transferred to stepdown at this time.  -We will check an ABG given her acute hypoxic change but the patient is alert and oriented x3 and mentating very well.  - will check a chest xray to make certain there isnt an infiltrate contributing to her distress.  - on 3/18; left-sided chest tube placed on 317 after chest x-ray was obtained.              -Appreciate Dr. Assunta Found assistance.              -Chest tube has been minimal bloody drainage at this time. -40 cm of suction.  Chest tube without air leak.            -Chest x-ray does show improvement but still small apical pneumothorax.     -3/22: repeat chest xray in am. Respiratory status appears to be improving.  - 3/23: Chest tube has been removed. I will do a six minute walk test today to              See if she requires oxygen.   -  Will consult pt/ot also.    -3/19; CT of thorax performed shows bullae and blebs.  There is mention of a 17 mm pulmonary left lung nodule.  No pneumothorax per CAT scan  thorax.  -Clinically she feels better though.    -3/20: Chest x-ray still shows left pneumothorax: 10 to 15%.          : Therefore currently chest tube with suction.  : Respiratory status remains stable.    - 3/21: Chest x-ray this morning showed no air leak no pneumothorax.              We will leave on suction per cardiothoracic surgery.              We will de-escalate Solu-Medrol to prednisone 40 mg once a day.    2.  Acute exacerbation COPD. Called earlier by RT who noted Pt had been changed to Prednisone 40mg  every day, resumed Solumedrol 60mg  q 8h.     -Minimal expiratory wheezing we will continue Solu-Medrol IV every 8 hours at this time .  -No wheezing at this time will de-escalate to prednisone 40 mg at this time.    3.  Acute bronchitis  4.  HTN, uncontrolled: Due to acute dyspnea.       -We will increase losartan to 50  mg and increase diltiazem to 240 mg from         180 mg.  5.  T2DM  6.  Left upper lobe lung nodule, will need continued follow-up   7.  Dyslipidemia  8.  PTSD, she was [place] [employer] [place], has had respiratory problems since.  RN notes anxiety @ times. Try prn Hydroxyzine  9.  Peripheral neuropathy  10.  GERD.    11.  DJD  12.  DVT proph w sc Heparin  13.  Dispo: Still probably needs a couple of days  Subjective:   Interval history:    3/23: she is quite anxious and tearful. She has much upper airway congestion.           I told her she needs to use flutter valve more often.     3/22: She denies any major dyspnea she states her cough is improving.  She think she is constipated but she states that she is on laxatives and stool softeners at this time.  She appears happy with her progress.    3/21: She appears comfortable and in no respiratory distress at this time.    3/20 Doing well . Only mild pleuritic chest pain.    3/19: She is lying in bed and states that she feels much better.  States her breathing is improved.  Appears to be quite happy with her progress.    Minimal  drainage from chest tube.    3/18; she appears to be more comfortable today but obviously and still moderate amount of pain to the chest tube which is to be expected.  Overall respiratory status is improved.  She states that her anxiety is controlled at this time.  She appears quite optimistic as she should be.      3/17: Rapid response team was called because she patient went into acute hypoxic respiratory failure and began to have tach tachycardia and tachypnea the patient had to be placed back on BiPAP at this time.  An ABG chest x-ray will be obtained we will have to escalate her nebulizer treatments.  We will need to move her to the stepdown at this time.  Respiratory is on board.  She does appear to be quite anxious and therefore gave Ativan 0.5 mg IV x1 and will make Ativan 0.5 mg IV every 6 hours as needed as patient does have a history of PTSD she was exposed to respiratory problems since we will treat started on 11.      Cardiovascular ROS: no chest pain ,  orthopnea  Gastrointestinal ROS: no abdominal pain, nausea,vomiting, diarrhea, melena or hematechezia  Genito-Urinary ROS: no dysuria, trouble voiding, or hematuria  Objective:   Physical Exam:     Visit Vitals  BP 123/78 (BP 1 Location: Right arm, BP Patient Position: Supine)   Pulse 93   Temp 98.8 ??F (37.1 ??C)   Resp 20   Ht 5\' 8"  (1.727 m)   Wt 93.8 kg (206 lb 11.2 oz)   SpO2 98%   BMI 31.43 kg/m??    O2 Flow Rate (L/min): 2 l/min O2 Device: Nasal cannula    Temp (24hrs), Avg:98.5 ??F (36.9 ??C), Min:98 ??F (36.7 ??C), Max:99 ??F (37.2 ??C)    03/23 0701 - 03/23 1900  In: 600 [P.O.:600]  Out: -    03/21 1901 - 03/23 0700  In: 1670 [P.O.:1670]  Out: 2317 [Urine:2300]    General:  Alert, cooperative, no distress, appears stated age.  Lungs:    Still significant exp wheezes    :     Heart:  Regular rate and rhythm, S1, S2 normal, no murmur, click, rub or gallop.   Abdomen:   Soft, non-tender. Bowel sounds normal. No masses,  No organomegaly.   Extremities:   FROM, no LE edema   Pulses: 2+ and symmetric all extremities.   Skin: Skin color, texture, turgor normal. No rashes or lesions   :      Data Review:       24 Hour Results:  Recent Results (from the past 24 hour(s))   GLUCOSE, POC    Collection Time: 09/02/18  5:26 PM   Result Value Ref Range    Glucose (POC) 286 (H) 65 - 105 mg/dL   GLUCOSE, POC    Collection Time: 09/02/18  9:46 PM   Result Value Ref Range    Glucose (POC) 93 65 - 105 mg/dL   RENAL FUNCTION PANEL    Collection Time: 09/03/18  6:10 AM   Result Value Ref Range    Sodium 134 (L) 136 - 145 mEq/L    Potassium 4.9 3.5 - 5.1 mEq/L    Chloride 101 98 - 107 mEq/L    CO2 29 21 - 32 mEq/L    Glucose 165 (H) 74 - 106 mg/dl    BUN 35 (H) 7 - 25 mg/dl    Creatinine 0.8 0.6 - 1.3 mg/dl    GFR est AA >16.1      GFR est non-AA >60      Calcium 8.7 8.5 - 10.1 mg/dl    Albumin 2.7 (L) 3.4 - 5.0 gm/dl    Phosphorus 2.8 2.5 - 4.9 mg/dl    Anion gap 5 5 - 15 mmol/L   GLUCOSE, POC    Collection Time: 09/03/18  8:20 AM   Result Value Ref Range    Glucose (POC) 129 (H) 65 - 105 mg/dL   GLUCOSE, POC    Collection Time: 09/03/18 11:27 AM   Result Value Ref Range    Glucose (POC) 267 (H) 65 - 105 mg/dL       Problem List:  Problem List as of 09/03/2018 Date Reviewed: 08/21/2018          Codes Class Noted - Resolved    Respiratory failure with hypoxia [facility]) ICD-10-CM: J96.91  ICD-9-CM: 518.81  08/22/2018 - Present        Headache ICD-10-CM: R51  ICD-9-CM: 784.0  08/05/2018 - Present        Abdominal pain ICD-10-CM: R10.9  ICD-9-CM: 789.00  02/04/2018 - Present        SIRS (systemic inflammatory response syndrome) (HCC) ICD-10-CM: R65.10  ICD-9-CM: 995.90  10/09/2017 - Present        Hyperkalemia ICD-10-CM: E87.5  ICD-9-CM: 276.7  09/08/2017 - Present        Obtunded ICD-10-CM: R40.1  ICD-9-CM: 780.09  09/08/2017 - Present        Acute renal failure (ARF) (HCC) ICD-10-CM: N17.9  ICD-9-CM: 584.9  09/08/2017 - Present        Septic shock (HCC) ICD-10-CM: A41.9, R65.21  ICD-9-CM: 038.9,  785.52, 995.92  09/08/2017 - Present        Metabolic encephalopathy ICD-10-CM: G93.41  ICD-9-CM: 348.31  09/08/2017 - Present        Dehydration ICD-10-CM: E86.0  ICD-9-CM: 276.51  08/07/2017 - Present        COPD with acute exacerbation (HCC) ICD-10-CM: J44.1  ICD-9-CM: 491.21  08/07/2017 -  Present        Acute-on-chronic kidney injury [facility]) ICD-10-CM: N17.9, N18.9  ICD-9-CM: 584.9, 585.9  08/07/2017 - Present        Chest pain ICD-10-CM: R07.9  ICD-9-CM: 786.50  06/15/2017 - Present        Dyspnea ICD-10-CM: R06.00  ICD-9-CM: 786.09  06/15/2017 - Present        Type 2 diabetes mellitus with diabetic neuropathy (HCC) ICD-10-CM: E11.40  ICD-9-CM: 250.60, 357.2  12/07/2016 - Present        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9  ICD-9-CM: 038.9, 995.91  07/24/2016 - Present        Asthma exacerbation ICD-10-CM: J45.901  ICD-9-CM: 493.92  07/24/2016 - Present        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: E11.21  ICD-9-CM: 250.40, 583.81  06/12/2016 - Present        Sacroiliitis (HCC) ICD-10-CM: M46.1  ICD-9-CM: 720.2  11/25/2015 - Present        Essential hypertension ICD-10-CM: I10  ICD-9-CM: 401.9  10/06/2015 - Present        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  ICD-9-CM: 721.3  09/15/2015 - Present        Lumbar and sacral osteoarthritis ICD-10-CM: M47.817  ICD-9-CM: 721.3   09/15/2015 - Present        Chronic pain syndrome ICD-10-CM: G89.4  ICD-9-CM: 338.4  09/15/2015 - Present        Type 2 diabetes mellitus with hyperglycemia, without long-term current use of insulin (HCC) ICD-10-CM: E11.65  ICD-9-CM: 250.00, 790.29  08/20/2015 - Present        Acute colitis ICD-10-CM: K52.9  ICD-9-CM: 558.9  07/02/2015 - Present        Acute hyperglycemia ICD-10-CM: R73.9  ICD-9-CM: 790.29  07/02/2015 - Present        Accelerated hypertension ICD-10-CM: I10  ICD-9-CM: 401.0  04/08/2015 - Present        Severe headache ICD-10-CM: R51  ICD-9-CM: 784.0  04/07/2015 - Present        Osteoarthritis of hips, 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  ICD-9-CM: 715.95  01/29/2015 - Present        PTSD (post-traumatic stress disorder) (Chronic) ICD-10-CM: F43.10  ICD-9-CM: 309.81  01/29/2015 - Present        Severe hypertension (Chronic) ICD-10-CM: I10  ICD-9-CM: 401.9  07/21/2014 - Present        RESOLVED: COPD exacerbation (HCC) ICD-10-CM: J44.1  ICD-9-CM: 491.21  11/14/2017 - 08/08/2018        RESOLVED: New onset type 1 diabetes mellitus, uncontrolled (HCC) ICD-10-CM: E10.65  ICD-9-CM: 250.03  07/02/2015 - 10/06/2015        RESOLVED: Recurrent major depressive disorder, in full remission (HCC) ICD-10-CM: F33.42  ICD-9-CM: 296.36  12/02/2014 - 12/02/2014        RESOLVED: Foreign body in colon ICD-10-CM: T18.4XXA  ICD-9-CM: 936  08/14/2014 - 01/29/2015        RESOLVED: Advanced care planning/counseling discussion ICD-10-CM: Z71.89  ICD-9-CM: V65.49  07/21/2014 - 01/29/2015    Overview Signed 07/21/2014  3:46 PM by Littie Deeds     Patient given [place] application               RESOLVED: Hip pain, chronic ICD-10-CM: M25.559, G89.29  ICD-9-CM: 719.45, 338.29  07/21/2014 - 01/29/2015               Medications reviewed  Current Facility-Administered Medications   Medication Dose Route Frequency   ??? albuterol-ipratropium (DUO-NEB) 2.5 MG-0.5 MG/3 ML  3 mL Nebulization Q6H RT   ??? predniSONE (DELTASONE) tablet 40 mg  40 mg Oral DAILY WITH  BREAKFAST   ??? patiromer calcium sorbitex (VELTASSA) powder 8.4 g  8.4 g Oral DAILY   ??? acetylcysteine (MUCOMYST) 100 mg/mL (10 %) nebulizer solution 400 mg  4 mL Nebulization BID RT   ??? dilTIAZem CD (CARDIZEM CD) capsule 240 mg  240 mg Oral DAILY   ??? losartan (COZAAR) tablet 50 mg  50 mg Oral DAILY   ??? polyethylene glycol (MIRALAX) packet 17 g  17 g Oral BID PRN   ??? bisacodyL (DULCOLAX) suppository 10 mg  10 mg Rectal DAILY PRN   ??? guaiFENesin ER (MUCINEX) tablet 1,200 mg  1,200 mg Oral Q12H   ??? LORazepam (ATIVAN) injection 0.5 mg  0.5 mg IntraVENous Q6H PRN   ??? cetirizine (ZYRTEC) tablet 10 mg  10 mg Oral DAILY   ??? fluticasone propionate (FLONASE) 50 mcg/actuation nasal spray 2 Spray  2 Spray Both Nostrils BID   ??? HYDROcodone-acetaminophen (NORCO) 5-325 mg per tablet 1-2 Tab  1-2 Tab Oral Q4H PRN   ??? HYDROmorphone (DILAUDID) injection 0.5-1 mg  0.5-1 mg IntraVENous Q3H PRN   ??? docusate sodium (COLACE) capsule 100 mg  100 mg Oral BID   ??? hydrOXYzine HCL (ATARAX) tablet 25 mg  25 mg Oral TID PRN   ??? cloNIDine HCL (CATAPRES) tablet 0.3 mg  0.3 mg Oral BID   ??? QUEtiapine SR (SEROquel XR) tablet 150 mg  150 mg Oral QHS   ??? revefenacin (YUPELRI) nebulizer solution 175 mcg  175 mcg Nebulization DAILY   ??? dextromethorphan (DELSYM) 30 mg/5 mL syrup 30 mg  30 mg Oral Q12H PRN   ??? gabapentin (NEURONTIN) capsule 300 mg  300 mg Oral TID   ??? montelukast (SINGULAIR) tablet 10 mg  10 mg Oral QHS   ??? naloxone (NARCAN) injection 0.1 mg  0.1 mg IntraVENous PRN   ??? acetaminophen (TYLENOL) tablet 650 mg  650 mg Oral Q4H PRN    Or   ??? acetaminophen (TYLENOL) solution 650 mg  650 mg Oral Q4H PRN    Or   ??? acetaminophen (TYLENOL) suppository 650 mg  650 mg Rectal Q4H PRN   ??? ondansetron (ZOFRAN) injection 4 mg  4 mg IntraVENous Q4H PRN   ??? heparin (porcine) injection 5,000 Units  5,000 Units SubCUTAneous Q8H   ??? arformoteroL (BROVANA) neb solution 15 mcg  15 mcg Nebulization BID RT   ??? budesonide (PULMICORT) 500 mcg/2 ml nebulizer  suspension  500 mcg Nebulization BID RT   ??? dextrose (D50W) injection syrg 5-25 g  10-50 mL IntraVENous PRN   ??? glucagon (GLUCAGEN) injection 1 mg  1 mg  IntraMUSCular PRN   ??? insulin glargine (LANTUS) injection 1-100 Units  1-100 Units SubCUTAneous QHS   ??? insulin lispro (HUMALOG) injection 1-100 Units  1-100 Units SubCUTAneous AC&HS   ??? insulin lispro (HUMALOG) injection 1-100 Units  1-100 Units SubCUTAneous PRN        Care Plan discussed with: pt     Total time spent with patient: 25 minutes.    Darrin Nipper, MD  September 03, 2018  1045

## 2018-09-03 NOTE — Progress Notes (Signed)
Progress Notes by Mariam Dollar, PA-C at 09/03/18 1610                Author: Mariam Dollar, PA-C  Service: Physician Assistant  Author Type: Physician Assistant       Filed: 09/03/18 0925  Date of Service: 09/03/18 0923  Status: Attested           Editor: Mariam Dollar, PA-C (Physician Assistant)  Cosigner: Philis Pique, MD at 09/03/18 [phone]          Attestation signed by Philis Pique, MD at 09/03/18 [phone]          Cxr reviewed no change   Ct removed   Stable from surgery standpoint                                 SURGERY PROGRESS NOTE   Progress Note      Patient: [name] [name]               Sex: female          DOA: 08/22/2018         Date of Birth:  05-Dec-1950      Age:  68 y.o.        LOS:  LOS: 12 days       Chart reviewed.       Assessment and Plan:     [name] is  a 68 y.o. year old female s/p chest  tube insertion for spontaneous pneumothorax on 3/17.       CXR this AM shows possible stable apical space.  Chest tube removed today.       Plan:   1. Ct removed today.  Stable for discharge from a surgical standpoint.    2.  Pt may remove dressing in 3 days.  No need for office f/u.  She will call the office with any concerns.       Problem List:   Active Problems:     Respiratory failure with hypoxia (HCC) (08/22/2018)                     Objective:         Vital Signs:   Visit Vitals      BP  134/90 (BP 1 Location: Right arm, BP Patient Position: Supine)     Pulse  86     Temp  98.3 ??F (36.8 ??C)     Resp  22     Ht  5\' 8"  (1.727 m)     Wt  93.8 kg (206 lb 11.2 oz)     SpO2  98%        BMI  31.43 kg/m??        Temp (24hrs), Avg:98.4 ??F (36.9 ??C), Min:98 ??F (36.7 ??C), Max:99 ??F (37.2 ??C)         Admission Weight:  Last Weight        Weight: 85.3 kg (188 lb)  Weight: 93.8 kg (206 lb 11.2 oz)        Intake and Output:   Last three shifts:  03/21 1901 - 03/23 0700   In: 1670 [P.O.:1670]   Out: 2317 [Urine:2300]      Lab Results:     Lab Results         Component  Value  Date/Time  WBC  19.4 (H)  08/29/2018 06:23 AM       HCT  42.8  08/29/2018 06:23 AM            PLT  266  08/29/2018 06:23 AM           Lab Results         Component  Value  Date/Time            NA  134 (L)  09/03/2018 06:10 AM       K  4.9  09/03/2018 06:10 AM       CL  101  09/03/2018 06:10 AM       CO2  29  09/03/2018 06:10 AM       GLU  165 (H)  09/03/2018 06:10 AM       BUN  35 (H)  09/03/2018 06:10 AM       CREA  0.8  09/03/2018 06:10 AM       CREA  0.9  09/02/2018 06:42 AM            CREA  1.0  09/01/2018 06:59 AM           Medications:     Current Facility-Administered Medications          Medication  Dose  Route  Frequency           ?  albuterol-ipratropium (DUO-NEB) 2.5 MG-0.5 MG/3 ML   3 mL  Nebulization  Q6H RT     ?  predniSONE (DELTASONE) tablet 40 mg   40 mg  Oral  DAILY WITH BREAKFAST     ?  patiromer calcium sorbitex (VELTASSA) powder 8.4 g   8.4 g  Oral  DAILY     ?  acetylcysteine (MUCOMYST) 100 mg/mL (10 %) nebulizer solution 400 mg   4 mL  Nebulization  BID RT     ?  dilTIAZem CD (CARDIZEM CD) capsule 240 mg   240 mg  Oral  DAILY     ?  losartan (COZAAR) tablet 50 mg   50 mg  Oral  DAILY     ?  polyethylene glycol (MIRALAX) packet 17 g   17 g  Oral  BID PRN     ?  bisacodyL (DULCOLAX) suppository 10 mg   10 mg  Rectal  DAILY PRN           ?  guaiFENesin ER (MUCINEX) tablet 1,200 mg   1,200 mg  Oral  Q12H           ?  LORazepam (ATIVAN) injection 0.5 mg   0.5 mg  IntraVENous  Q6H PRN     ?  cetirizine (ZYRTEC) tablet 10 mg   10 mg  Oral  DAILY     ?  fluticasone propionate (FLONASE) 50 mcg/actuation nasal spray 2 Spray   2 Spray  Both Nostrils  BID     ?  HYDROcodone-acetaminophen (NORCO) 5-325 mg per tablet 1-2 Tab   1-2 Tab  Oral  Q4H PRN     ?  HYDROmorphone (DILAUDID) injection 0.5-1 mg   0.5-1 mg  IntraVENous  Q3H PRN     ?  docusate sodium (COLACE) capsule 100 mg   100 mg  Oral  BID     ?  hydrOXYzine HCL (ATARAX) tablet 25 mg   25 mg  Oral  TID PRN     ?  cloNIDine HCL (CATAPRES) tablet 0.3 mg   0.3 mg  Oral  BID     ?  QUEtiapine SR (SEROquel XR) tablet 150 mg   150 mg  Oral  QHS     ?  revefenacin (YUPELRI) nebulizer solution 175 mcg   175 mcg  Nebulization  DAILY     ?  dextromethorphan (DELSYM) 30 mg/5 mL syrup 30 mg   30 mg  Oral  Q12H PRN     ?  gabapentin (NEURONTIN) capsule 300 mg   300 mg  Oral  TID     ?  montelukast (SINGULAIR) tablet 10 mg   10 mg  Oral  QHS     ?  naloxone (NARCAN) injection 0.1 mg   0.1 mg  IntraVENous  PRN     ?  acetaminophen (TYLENOL) tablet 650 mg   650 mg  Oral  Q4H PRN          Or           ?  acetaminophen (TYLENOL) solution 650 mg   650 mg  Oral  Q4H PRN          Or           ?  acetaminophen (TYLENOL) suppository 650 mg   650 mg  Rectal  Q4H PRN           ?  HYDROcodone-acetaminophen (NORCO) 5-325 mg per tablet 1 Tab   1 Tab  Oral  Q4H PRN           ?  ondansetron (ZOFRAN) injection 4 mg   4 mg  IntraVENous  Q4H PRN     ?  heparin (porcine) injection 5,000 Units   5,000 Units  SubCUTAneous  Q8H     ?  arformoteroL (BROVANA) neb solution 15 mcg   15 mcg  Nebulization  BID RT     ?  budesonide (PULMICORT) 500 mcg/2 ml nebulizer suspension   500 mcg  Nebulization  BID RT     ?  dextrose (D50W) injection syrg 5-25 g   10-50 mL  IntraVENous  PRN     ?  glucagon (GLUCAGEN) injection 1 mg   1 mg  IntraMUSCular  PRN     ?  insulin glargine (LANTUS) injection 1-100 Units   1-100 Units  SubCUTAneous  QHS     ?  insulin lispro (HUMALOG) injection 1-100 Units   1-100 Units  SubCUTAneous  AC&HS           ?  insulin lispro (HUMALOG) injection 1-100 Units   1-100 Units  SubCUTAneous  PRN              Mariam Dollar, PA-C   September 03, 2018   9:23 AM

## 2018-09-03 NOTE — Progress Notes (Signed)
PHYSICAL THERAPY MISSED SESSION        Patient: [name] [name] (68 y.o. female)  Room: 5110/5110    Date: 09/03/2018  Time:  1015  Primary Diagnosis: Respiratory failure with hypoxia (HCC) [J96.91]    Chart reviewed and discussed with nsg.    OBJECTIVE:  Patient was not seen for skilled physical therapy treatment, secondary to Pt. declined and c/o too much pain.    Comments: pt declined PT due to pain    PLAN: Will follow up as patient's condition and/or schedule permits.        Darnelle Going, PT  Pager:

## 2018-09-04 ENCOUNTER — Inpatient Hospital Stay: Admit: 2018-09-04 | Payer: MEDICARE | Primary: Physician Assistant

## 2018-09-04 LAB — RENAL FUNCTION PANEL
Albumin: 2.6 gm/dl — ABNORMAL LOW (ref 3.4–5.0)
Albumin: 2.6 gm/dl — ABNORMAL LOW (ref 3.4–5.0)
Anion Gap: 9 mmol/L (ref 5–15)
Anion gap: 9 mmol/L (ref 5–15)
BUN: 30 mg/dl — ABNORMAL HIGH (ref 7–25)
BUN: 30 mg/dl — ABNORMAL HIGH (ref 7–25)
CO2: 27 mEq/L (ref 21–32)
CO2: 27 mEq/L (ref 21–32)
Calcium: 8.3 mg/dl — ABNORMAL LOW (ref 8.5–10.1)
Calcium: 8.3 mg/dl — ABNORMAL LOW (ref 8.5–10.1)
Chloride: 101 mEq/L (ref 98–107)
Chloride: 101 mEq/L (ref 98–107)
Creatinine: 0.8 mg/dl (ref 0.6–1.3)
Creatinine: 0.8 mg/dl (ref 0.6–1.3)
EGFR IF NonAfrican American: >60
GFR African American: >60
GFR est AA: >60
GFR est non-AA: >60
Glucose: 152 mg/dl — ABNORMAL HIGH (ref 74–106)
Glucose: 152 mg/dl — ABNORMAL HIGH (ref 74–106)
Phosphorus: 3.7 mg/dl (ref 2.5–4.9)
Phosphorus: 3.7 mg/dl (ref 2.5–4.9)
Potassium: 4.4 mEq/L (ref 3.5–5.1)
Potassium: 4.4 mEq/L (ref 3.5–5.1)
Sodium: 137 mEq/L (ref 136–145)
Sodium: 137 mEq/L (ref 136–145)

## 2018-09-04 LAB — POCT GLUCOSE
POC Glucose: 251 mg/dL — ABNORMAL HIGH (ref 65–105)
POC Glucose: 215 mg/dL — ABNORMAL HIGH (ref 65–105)
POC Glucose: 126 mg/dL — ABNORMAL HIGH (ref 65–105)
POC Glucose: 143 mg/dL — ABNORMAL HIGH (ref 65–105)

## 2018-09-04 LAB — GLUCOSE, POC
Glucose (POC): 251 mg/dL — ABNORMAL HIGH (ref 65–105)
Glucose (POC): 215 mg/dL — ABNORMAL HIGH (ref 65–105)
Glucose (POC): 126 mg/dL — ABNORMAL HIGH (ref 65–105)
Glucose (POC): 143 mg/dL — ABNORMAL HIGH (ref 65–105)

## 2018-09-04 MED ORDER — LORATADINE 10 MG TAB
10 mg | Freq: Every day | ORAL | Status: DC
Start: 2018-09-04 — End: 2018-09-05
  Administered 2018-09-05: 13:00:00 via ORAL

## 2018-09-04 MED FILL — GABAPENTIN 300 MG CAP: 300 mg | ORAL | Qty: 1

## 2018-09-04 MED FILL — CLONIDINE 0.1 MG TAB: 0.1 mg | ORAL | Qty: 3

## 2018-09-04 MED FILL — DOCUSATE SODIUM 100 MG CAP: 100 mg | ORAL | Qty: 1

## 2018-09-04 MED FILL — HYDROMORPHONE 1 MG/ML INJECTION SOLUTION: 1 mg/mL | INTRAMUSCULAR | Qty: 1

## 2018-09-04 MED FILL — YUPELRI 175 MCG/3 ML SOLUTION FOR NEBULIZATION: 175 mcg/3 mL | RESPIRATORY_TRACT | Qty: 1

## 2018-09-04 MED FILL — HYDROCODONE-ACETAMINOPHEN 5 MG-325 MG TAB: 5-325 mg | ORAL | Qty: 1

## 2018-09-04 MED FILL — IPRATROPIUM-ALBUTEROL 2.5 MG-0.5 MG/3 ML NEB SOLUTION: 2.5 mg-0.5 mg/3 ml | RESPIRATORY_TRACT | Qty: 3

## 2018-09-04 MED FILL — HEPARIN (PORCINE) 5,000 UNIT/ML IJ SOLN: 5000 unit/mL | INTRAMUSCULAR | Qty: 1

## 2018-09-04 MED FILL — PREDNISONE 20 MG TAB: 20 mg | ORAL | Qty: 2

## 2018-09-04 MED FILL — ACETYLCYSTEINE 10 % (100 MG/ML) SOLN: 100 mg/mL (10 %) | Qty: 4

## 2018-09-04 MED FILL — MONTELUKAST 10 MG TAB: 10 mg | ORAL | Qty: 1

## 2018-09-04 MED FILL — DILTIAZEM ER 120 MG 24 HR CAP: 120 mg | ORAL | Qty: 2

## 2018-09-04 MED FILL — MUCINEX 600 MG TABLET, EXTENDED RELEASE: 600 mg | ORAL | Qty: 2

## 2018-09-04 MED FILL — BUDESONIDE 0.5 MG/2 ML NEB SUSPENSION: 0.5 mg/2 mL | RESPIRATORY_TRACT | Qty: 1

## 2018-09-04 MED FILL — CETIRIZINE 5 MG TAB: 5 mg | ORAL | Qty: 2

## 2018-09-04 MED FILL — QUETIAPINE SR 50 MG 24 HR TAB: 50 mg | ORAL | Qty: 3

## 2018-09-04 MED FILL — BROVANA 15 MCG/2 ML SOLUTION FOR NEBULIZATION: 15 mcg/2 mL | RESPIRATORY_TRACT | Qty: 1

## 2018-09-04 MED FILL — LOSARTAN 50 MG TAB: 50 mg | ORAL | Qty: 1

## 2018-09-04 NOTE — Other (Signed)
Bedside shift change report given to Amy, RN (oncoming nurse) by Kat, RN (offgoing nurse). Report included the following information SBAR.

## 2018-09-04 NOTE — Progress Notes (Signed)
SURGERY PROGRESS NOTE  Progress Note    Patient: [name] [name]               Sex: female          DOA: 08/22/2018       Date of Birth:  06-07-1951      Age:  68 y.o.        LOS:  LOS: 13 days     Chart reviewed.    Assessment and Plan:   Ms. Beiter is a 68 y.o. year old female s/p chest tube insertion for spontaneous pneumothorax on 3/17.   ??  Chest tube removed yesterday (3/23).  Pt reports no increase in SOB. Breath sounds equal bilaterally.     Pt did not ambulate yesterday.  Encouraged ambulation and OOB to chair as tolerated.     Plan:  1. Stable for discharge from a surgical standpoint. Will sign off and be available as needed.   2. Pt may remove dressing in 2 days.  No need for office f/u.  She will call the office with any concerns.   ??    Problem List:  Active Problems:    Respiratory failure with hypoxia (HCC) (08/22/2018)            Objective:      Vital Signs:  Visit Vitals  BP 128/80 (BP 1 Location: Right arm, BP Patient Position: Supine)   Pulse 95   Temp 98 ??F (36.7 ??C)   Resp 20   Ht 5\' 8"  (1.727 m)   Wt 93.8 kg (206 lb 11.2 oz)   SpO2 94%   BMI 31.43 kg/m??     Temp (24hrs), Avg:98.2 ??F (36.8 ??C), Min:97.7 ??F (36.5 ??C), Max:98.8 ??F (37.1 ??C)    Admission Weight: Last Weight   Weight: 85.3 kg (188 lb) Weight: 93.8 kg (206 lb 11.2 oz)       Intake and Output:  Last three shifts:  03/22 1901 - 03/24 0700  In: 2240 [P.O.:2240]  Out: 3800 [Urine:3800]    Lab Results:  Lab Results   Component Value Date/Time    WBC 19.4 (H) 08/29/2018 06:23 AM    HCT 42.8 08/29/2018 06:23 AM    PLT 266 08/29/2018 06:23 AM      Lab Results   Component Value Date/Time    NA 137 09/04/2018 06:08 AM    K 4.4 09/04/2018 06:08 AM    CL 101 09/04/2018 06:08 AM    CO2 27 09/04/2018 06:08 AM    GLU 152 (H) 09/04/2018 06:08 AM    BUN 30 (H) 09/04/2018 06:08 AM    CREA 0.8 09/04/2018 06:08 AM    CREA 0.8 09/03/2018 06:10 AM    CREA 0.9 09/02/2018 06:42 AM       Medications:  Current Facility-Administered Medications    Medication Dose Route Frequency   ??? albuterol-ipratropium (DUO-NEB) 2.5 MG-0.5 MG/3 ML  3 mL Nebulization Q6H RT   ??? predniSONE (DELTASONE) tablet 40 mg  40 mg Oral DAILY WITH BREAKFAST   ??? dilTIAZem CD (CARDIZEM CD) capsule 240 mg  240 mg Oral DAILY   ??? losartan (COZAAR) tablet 50 mg  50 mg Oral DAILY   ??? polyethylene glycol (MIRALAX) packet 17 g  17 g Oral BID PRN   ??? bisacodyL (DULCOLAX) suppository 10 mg  10 mg Rectal DAILY PRN   ??? guaiFENesin ER (MUCINEX) tablet 1,200 mg  1,200 mg Oral Q12H   ??? LORazepam (ATIVAN) injection 0.5 mg  0.5 mg IntraVENous Q6H PRN   ??? cetirizine (ZYRTEC) tablet 10 mg  10 mg Oral DAILY   ??? fluticasone propionate (FLONASE) 50 mcg/actuation nasal spray 2 Spray  2 Spray Both Nostrils BID   ??? HYDROcodone-acetaminophen (NORCO) 5-325 mg per tablet 1-2 Tab  1-2 Tab Oral Q4H PRN   ??? HYDROmorphone (DILAUDID) injection 0.5-1 mg  0.5-1 mg IntraVENous Q3H PRN   ??? docusate sodium (COLACE) capsule 100 mg  100 mg Oral BID   ??? hydrOXYzine HCL (ATARAX) tablet 25 mg  25 mg Oral TID PRN   ??? cloNIDine HCL (CATAPRES) tablet 0.3 mg  0.3 mg Oral BID   ??? QUEtiapine SR (SEROquel XR) tablet 150 mg  150 mg Oral QHS   ??? revefenacin (YUPELRI) nebulizer solution 175 mcg  175 mcg Nebulization DAILY   ??? dextromethorphan (DELSYM) 30 mg/5 mL syrup 30 mg  30 mg Oral Q12H PRN   ??? gabapentin (NEURONTIN) capsule 300 mg  300 mg Oral TID   ??? montelukast (SINGULAIR) tablet 10 mg  10 mg Oral QHS   ??? naloxone (NARCAN) injection 0.1 mg  0.1 mg IntraVENous PRN   ??? acetaminophen (TYLENOL) tablet 650 mg  650 mg Oral Q4H PRN    Or   ??? acetaminophen (TYLENOL) solution 650 mg  650 mg Oral Q4H PRN    Or   ??? acetaminophen (TYLENOL) suppository 650 mg  650 mg Rectal Q4H PRN   ??? ondansetron (ZOFRAN) injection 4 mg  4 mg IntraVENous Q4H PRN   ??? heparin (porcine) injection 5,000 Units  5,000 Units SubCUTAneous Q8H   ??? arformoteroL (BROVANA) neb solution 15 mcg  15 mcg Nebulization BID RT    ??? budesonide (PULMICORT) 500 mcg/2 ml nebulizer suspension  500 mcg Nebulization BID RT   ??? dextrose (D50W) injection syrg 5-25 g  10-50 mL IntraVENous PRN   ??? glucagon (GLUCAGEN) injection 1 mg  1 mg IntraMUSCular PRN   ??? insulin glargine (LANTUS) injection 1-100 Units  1-100 Units SubCUTAneous QHS   ??? insulin lispro (HUMALOG) injection 1-100 Units  1-100 Units SubCUTAneous AC&HS   ??? insulin lispro (HUMALOG) injection 1-100 Units  1-100 Units SubCUTAneous PRN         Mariam Dollar, PA-C  September 04, 2018  10:13 AM

## 2018-09-04 NOTE — Progress Notes (Signed)
Hospitalist Progress Note  [name] Bimler, MD  [facility]    Daily Progress Note: 09/04/2018    Assessment/Plan:     Acute hypoxic respiratory failure   Pt was on BiPAP  Pt noted she does not use O2 at home  Will continue to wean O2  Check the patient's O2 sats off oxygen she was 92% at rest  We will mobilize the patient and will see if we wean the patient off of O2.    Acute L pneumothorax  S/p Chest tube (removed 3/23)  Follow-up pCxR 3/24 with small apical pneumothorax smaller than on 3/23    Acute COPD exacerbation  Seems to be resolved.  Initially on IV steroid, now weaned down to p.o. prednisone that will taper  Continue Brovana nebulizer, Pulmicort nebulizer, Zyrtec, Singulair    HTN  Controlled.    Continue clonidine patch, Cardizem CD 240 mg daily, losartan 50 mg daily    DM2  Glucomander protocol    HLD  Resume Crestor 5 mg daily    Peripheral neuropathy  Continue Neurontin 300 mg TID    PTSD  Patient is on Seroquel 100 mg at bedtime    2 cm LLL nodule  Patient will need follow-up as an outpatient with repeat CT scan    DVT prophylaxis   Heparin SQ    CODE STATUS.  Full code    Disposition.  Will try to wean the patient off of O2 and mobilize the patient.  Likely on by the end of the week      ------------------    36 minutes were spent on the patient of which more than 50% was spent in coordination of care and counseling (time spent with patient/family face to face, physical exam, reviewing laboratory and imaging investigations, speaking with physicians and nursing staff involved in this patient's care)    ------------------  Physical exam:  General:  Alert, NAD, pleasant.  Cooperative  HEENT: Sclera anicteric, PERRL, OM moist, throat clear.  Chest:  CTA B, no wheeze, no rales, no rhonchi.  Fair air movement.  CV: RRR, S1/S2 were normal, no murmur  Abdomen: NTND, soft, NABS, no masses were noted.  No rebound, no guarding.  Extremities: 1+ leg edema.    Subjective:    Getting better.  Patient notes she is less short of breath.  No wheezing at this time.  She notes that she is not on oxygen at home.  We discussed weaning the oxygen to off and also mobilizing her      Objective:     Visit Vitals  BP 125/80 (BP 1 Location: Right arm, BP Patient Position: Supine)   Pulse 85   Temp 98.2 ??F (36.8 ??C)   Resp 20   Ht  (1.727 m)   Wt 93.8 kg (206 lb 11.2 oz)   SpO2 97%   BMI 31.43 kg/m??    O2 Flow Rate (L/min): 2 l/min O2 Device: Room air    Temp (24hrs), Avg:98.1 ??F (36.7 ??C), Min:97.7 ??F (36.5 ??C), Max:98.6 ??F (37 ??C)    03/24 0701 - 03/24 1900  In: 720 [P.O.:720]  Out: 1000 [Urine:1000]   03/22 1901 - 03/24 0700  In: 2240 [P.O.:2240]  Out: 3800 [Urine:3800]      Data Review:       Recent Days:  No results for input(s): WBC, HGB, HCT, PLT, HGBEXT, HCTEXT, PLTEXT in the last 72 hours.  Recent Labs     09/04/18  [phone] 09/03/18  4540 09/02/18  0642   NA 137 134* 132*   K 4.4 4.9 5.0   CL 101 101 99   CO2 GLU 152* 165* 251*   BUN 30* 35* 40*   CREA 0.8 0.8 0.9   CA 8.3* 8.7 8.3*   PHOS 3.7 2.8 2.5   ALB 2.6* 2.7* 2.7*     No results for input(s): PH, PCO2, PO2, HCO3, FIO2 in the last 72 hours.    24 Hour Results:  Recent Results (from the past 24 hour(s))   GLUCOSE, POC    Collection Time: 09/03/18  5:04 PM   Result Value Ref Range    Glucose (POC) 172 (H) 65 - 105 mg/dL   GLUCOSE, POC    Collection Time: 09/03/18  9:11 PM   Result Value Ref Range    Glucose (POC) 251 (H) 65 - 105 mg/dL   RENAL FUNCTION PANEL    Collection Time: 09/04/18  6:08 AM   Result Value Ref Range    Sodium 137 136 - 145 mEq/L    Potassium 4.4 3.5 - 5.1 mEq/L    Chloride 101 98 - 107 mEq/L    CO2 27 21 - 32 mEq/L    Glucose 152 (H) 74 - 106 mg/dl    BUN 30 (H) 7 - 25 mg/dl    Creatinine 0.8 0.6 - 1.3 mg/dl    GFR est AA >98.1      GFR est non-AA >60      Calcium 8.3 (L) 8.5 - 10.1 mg/dl    Albumin 2.6 (L) 3.4 - 5.0 gm/dl    Phosphorus 3.7 2.5 - 4.9 mg/dl    Anion gap 9 5 - 15 mmol/L   GLUCOSE, POC     Collection Time: 09/04/18  7:49 AM   Result Value Ref Range    Glucose (POC) 215 (H) 65 - 105 mg/dL   GLUCOSE, POC    Collection Time: 09/04/18 11:50 AM   Result Value Ref Range    Glucose (POC) 126 (H) 65 - 105 mg/dL       Xr Chest Sngl V    Result Date: 09/04/2018  Clinical history: Chest pressure, chest tube removal EXAMINATION: Single view of the chest 09/04/2018 Correlation: Chest radiograph 09/03/2018 and CT scan 08/30/2018 FINDINGS: Interval removal of left chest tube. Small left apical pneumothorax. Left perihilar opacity corresponds to atelectasis on CT scan. 1.7 cm left lower lobe nodule. Right lung is clear.     IMPRESSION: 1. Removal of right chest tube, small right apical pneumothorax. 2. Left perihilar opacity corresponding to atelectasis. 3. 1.7 cm left lower lobe nodule.     Xr Chest Sngl V    Result Date: 09/03/2018  Exam: AP portable chest Clinical indication: Dyspnea Comparison: September 01, 2018 Results:  No consolidation.  No pleural effusions.    17 mm nodule left mid chest. Bullous disease left upper lobe. Persistent left upper lobe pneumothorax, unchanged. Left mid chest opacity. Unchanged. Heart normal.  Mediastinal contours are normal. Left chest tube unchanged. No free air is seen under the hemidiaphragms.   Osseous structures intact.     IMPRESSION:   1.  Persistent episode left upper lobe pneumothorax, unchanged. Left lung nodule. Stable bullous disease. 2. Left mid lung opacities likely representing atelectasis/consolidation, unchanged. 3. Left chest tube unchanged.     Xr Chest Sngl V    Result Date: 09/01/2018  INDICATION: Pneumothorax.    COMPARISON: 08/31/2018 TECHNIQUE: Single view  chest.     IMPRESSION: There is left lung pulmonary nodule, unchanged. Subtle left 10% apical pneumothorax, relatively unchanged. There is left-sided chest tube. Patchy infiltrates in the right lower lung zone, slightly increased since prior study.     Xr Chest Sngl V    Result Date: 08/31/2018   INDICATION: Pneumothorax   EXAMINATION: XR CHEST SNGL V COMPARISON: 08/30/2018 FINDINGS: The study shows a normal sized heart.. Partially 10 and 15% persistent left pneumothorax. 18 mm left lung nodule. Indwelling left chest tube.Marland Kitchen        IMPRESSION: 1. 18 mm left lung nodule. 2. 15% left pneumothorax. 3. Indwelling left chest tube.     Xr Chest Sngl V    Result Date: 08/30/2018  INDICATION: Pneumothorax   EXAMINATION: XR CHEST SNGL V COMPARISON: 08/29/2018 FINDINGS: The study shows a normal sized heart.. Proximally 10-15% residual left apical pneumothorax.. Indwelling left chest tube. Left lung mass..        IMPRESSION: 1. Similar 10-15% left pneumothorax. 2. Indwelling left chest tube. 3. Left lung mass.     Xr Chest Sngl V    Result Date: 08/29/2018  EXAMINATION: XR CHEST SNGL V CLINICAL INDICATION: Pneumothorax     COMPARISON:  August 28, 2018 TECHNIQUE: AP portable chest FINDINGS: 15 mm nodule left mid chest. Chest tube left mid to lower lung. Tip now directed medially. Marked improvement of the left pneumothorax. Left apical lines medially and a line to the left of the aortic arch suggest residual pneumothorax. Lucency in left mid chest may be due to a superimposed loculated pneumothorax or bulla. Mild right basilar infiltrate. Mild cardiomegaly.     IMPRESSION:  1. Marked reduction in the left-sided pneumothorax. Likely less than 15%-20% 2. Left lung nodule. 3. Left chest tube with tip now directed medially     Xr Chest Sngl V    Result Date: 08/28/2018  Clinical history: Chest tube placement EXAMINATION: Single view of the chest 08/28/2018 Correlation: CT scan 08/28/2018 FINDINGS: Trachea and heart size are within normal limits. Right lung is clear. There are left lung nodules. Placement of left chest tube. Small left apical lateral pneumothorax.     IMPRESSION: 1. Placement of left chest tube, left apical lateral pneumothorax. 2. Left lung nodules.     Xr Chest Sngl V    Result Date: 08/28/2018   EXAM:  Chest AP INDICATIONS: sob, COMPARISON: 08/22/2018. FINDINGS: New hyperlucency of the left upper lung zone with a triangular opacity in the left midlung zone. Findings are concerning for pneumothorax with associated partial atelectasis/collapse of the upper lobe. Stable nodular density in the left lower lung zone. The right lung is clear. Normal cardiomediastinal contours.     IMPRESSION: New large left pneumothorax. Results discussed with Dr. Laveda Norman on 08/28/2018 at 11:40 AM.     Xr Chest Sngl V    Result Date: 08/22/2018  Clinical history: Shortness of breath EXAMINATION: Single view of the chest 08/22/2018 Correlation: Chest radiograph 08/04/2018 and CTA chest 02/18/2018 FINDINGS: Trachea and heart size are within normal limits. Scarring/atelectasis left mid to lower lung. 2 cm left lower lobe nodule unchanged from chest radiograph of 08/04/2018 and CTA chest 02/18/2018. Right lung is clear.     IMPRESSION: 1. No acute pulmonary process. 2. 2 cm stable nodule left lower lobe unchanged from 08/04/2018 and evident on CT scan 02/18/2018.     Mri Brain Wo Cont    Result Date: 08/08/2018  EXAMINATION: MRI BRAIN WO CONT CLINICAL INDICATION: intractable HA  COMPARISON:  April 09, 2015 TECHNIQUE: T1 and T2-weighted sequences of the brain were obtained in multiple planes without contrast. FINDINGS: No restricted diffusion. Stable left posterior fossa dural based mass 10 mm, abutting tentorium. Isointense on T1 and T2. Findings typical of meningioma. Stable left frontal scalp subgaleal lipoma. Small right frontal subgaleal lipoma. No  no midline shift. No hydrocephalus. No extra-axial fluid collections. Visualized portions of the orbits grossly unremarkable. Mastoid air cells well aerated. Visualized portions of paranasal sinuses demonstrate mucosal thickening posterior right ethmoid air cells and right sphenoid sinus. Intracranial flow voids of great vessels, grossly intact. Extensive confluent T2 prolongation in corona  radiata and centrum semiovale, more than expected for patient's age. Extensive subcortical T2 prolongation in the frontoparietal lobes. T2 prolongation central pons. Marked frontotemporal lobe atrophy. No susceptibility artifact on gradient echo imaging to suggest hemorrhage. Cerebellar tonsil normal.          IMPRESSION: 1.   Stable left posterior fossa dural based mass. Long-term stability would be consistent with meningioma. 2. No acute infarction. 3. Extensive chronic microvascular ischemic change     Ct Chest Wo Cont    Result Date: 08/30/2018  Indication: Apical pneumothorax versus blebs following chest tube insertion.     IMPRESSION: Large biapical subpleural blebs and bullae. No pneumothorax. 17 mm left lung nodule. Comment: CT images of the chest were obtained without intravenous contrast. This was compared with all recent studies. A 9 French left chest tube is in place. There is no pneumothorax. Subpleural blebs and bullae are present in both lungs particularly in the upper lobes creating the lines simulating left pneumothorax on plain films. A 17 mm left lower lobe nodule. Atelectatic consolidation is present in the left upper lobe. There is minimal subsegmental atelectasis at the right lung base. The heart is of normal size. Aorta is of normal caliber. Focal calcification present in the left breast. These findings were discussed with Javier Glazier, PA.Marland Kitchen DICOM format imaged data is available to non-affiliated external healthcare facilities or entities on a secure, media free, reciprocally searchable basis with patient authorization  for 12 months following the date of the study.     Ct Guide Insrt Chest Tube    Result Date: 08/28/2018  Indication: Spontaneous left pneumothorax.     IMPRESSION: Using aseptic technique under CT guidance 9 French left chest tube was placed and connected to continuous low pressure suction. The pneumothorax was evacuated. The catheter was secured in place. There were  no complications and the patient left the radiology department in satisfactory condition. 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 Fentanyl sedation was utilized by the radiology nurse. Start time 1444 and end time 1504. DICOM format imaged data is available to non-affiliated external healthcare facilities or entities on a secure, media free, reciprocally searchable basis with patient authorization  for 12 months following the date of the study.     Duplex Temporal Art Right Ltd    Result Date: 08/07/2018                                                                Study ID: [id number]  [facility]                                           [address] [place],                                         [place]                                          [address]                          Temporal Artery Duplex Report Name: NATHALIE, CAVENDISH          Study Date: 08/07/2018 09:54 AM MRN: [id number]                    Patient Location: [id number] DOB: 20-Jan-1951                Age: 68 yrs Gender: Female                 Account #: [id number] Reason For Study: Headaches Ordering Physician: Scarlette Ar Performed By: Eddie North, RVT Interpretation Summary Normal velocities were dected in all vessels. No sign of halo detected around the parietal, frontal and temporal arteries. _____________________________________________________________________________ __ Quality/Procedure Limited color duplex ultrasound of the temporal arteries was performed. ICD 10: R51. History/symptoms Diabetes. HTN. COPD. Headaches. Right Superficial Temporal Artery Velocities RT: Proximal superficial temporal branch 39.6cm/s. RT: Mid superficial temporal branch  45.3cm/s. RT: Distal superficial temporal branch 40.6cm/s. Right Frontal Branch Velocities RT: Distal frontal branch 21.2cm/s. RT: Mid frontal branch 15.3cm/s. RT: Proximal frontal branch 27.8cm/s. Right Parietal Branch Velocities RT: Proximal parietal branch 38.4cm/s. RT: Mid parietal branch 24.9cm/s. RT: Distal parietal branch 30.3cm/s. Electronically signed byDr. Neta Ehlers, M.D   08/07/2018 04:43 PM      Microbiology:  All Micro Results     Procedure Component Value Units Date/Time    CULTURE, RESPIRATORY/SPUTUM/BRONCH Gay Filler STAIN [[id number]]  (Abnormal) Collected:  08/24/18  1554    Order Status:  Completed Specimen:  Respiratory specimen from Sputum Updated:  08/27/18 1457     GRAM STAIN       Rare Gram Positive Cocci In Clusters  Rare Gram Positive Bacilli  Few Gram Positive Cocci In Chains  <10 Epithelial cells/lpf  10 - 25 WBC's/lpf  Mucus Present       Culture result       Moderate Commensal Flora Isolated                 Cardiology:  Results for orders placed or performed during the hospital encounter of 08/22/18   EKG, 12 LEAD, INITIAL   Result Value Ref Range    Ventricular Rate 120 BPM    Atrial Rate 120 BPM    P-R Interval 144 ms    QRS Duration 72 ms    Q-T Interval 298 ms    QTC Calculation (Bezet) 421 ms    Calculated P Axis 72 degrees    Calculated R Axis 70 degrees    Calculated T Axis 66 degrees    Diagnosis       Sinus tachycardia  Otherwise normal ECG  When compared with ECG of 04-Aug-2018 23:00,  premature supraventricular complexes are no longer present  Nonspecific T wave abnormality no longer evident in Inferior leads  Nonspecific T wave abnormality no longer evident in Lateral leads  Confirmed by Yetta Barre, M.D., John P ([id number]) on 08/28/2018 4:43:46 PM           Problem List:  Problem List as of 09/04/2018 Date Reviewed: August 26, 2018          Codes Class Noted - Resolved    Respiratory failure with hypoxia (HCC) ICD-10-CM: J96.91  ICD-9-CM: 518.81  08/22/2018 - Present        Headache ICD-10-CM: R51   ICD-9-CM: 784.0  08/05/2018 - Present        Abdominal pain ICD-10-CM: R10.9  ICD-9-CM: 789.00  02/04/2018 - Present        SIRS (systemic inflammatory response syndrome) (HCC) ICD-10-CM: R65.10  ICD-9-CM: 995.90  10/09/2017 - Present        Hyperkalemia ICD-10-CM: E87.5  ICD-9-CM: 276.7  09/08/2017 - Present        Obtunded ICD-10-CM: R40.1  ICD-9-CM: 780.09  09/08/2017 - Present        Acute renal failure (ARF) (HCC) ICD-10-CM: N17.9  ICD-9-CM: 584.9  09/08/2017 - Present        Septic shock (HCC) ICD-10-CM: A41.9, R65.21  ICD-9-CM: 038.9, 785.52, 995.92  09/08/2017 - Present        Metabolic encephalopathy ICD-10-CM: G93.41  ICD-9-CM: 348.31  09/08/2017 - Present        Dehydration ICD-10-CM: E86.0  ICD-9-CM: 276.51  08/07/2017 - Present        COPD with acute exacerbation (HCC) ICD-10-CM: J44.1  ICD-9-CM: 491.21  08/07/2017 - Present        Acute-on-chronic kidney injury (HCC) ICD-10-CM: N17.9, N18.9  ICD-9-CM: 584.9, 585.9  08/07/2017 - Present        Chest pain ICD-10-CM: R07.9  ICD-9-CM: 786.50  06/15/2017 - Present        Dyspnea ICD-10-CM: R06.00  ICD-9-CM: 786.09  06/15/2017 - Present        Type 2 diabetes mellitus with diabetic neuropathy (HCC) ICD-10-CM: E11.40  ICD-9-CM: 250.60, 357.2  12/07/2016 - Present        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  ICD-9-CM: 569.89  08/17/2016 - Present  Cannabinoid hyperemesis syndrome (HCC) ICD-10-CM: F12.988  ICD-9-CM: 536.2, 305.20  08/17/2016 - Present        Gastritis ICD-10-CM: K29.70  ICD-9-CM: 535.50  08/16/2016 - Present        Nausea & vomiting ICD-10-CM: R11.2  ICD-9-CM: 787.01  08/16/2016 - Present        Asthma with acute exacerbation ICD-10-CM: J45.901  ICD-9-CM: 493.92  08/04/2016 - Present        Lactic acidosis ICD-10-CM: E87.2  ICD-9-CM: 276.2  07/24/2016 - Present        Leukocytosis ICD-10-CM: D72.829  ICD-9-CM: 288.60  07/24/2016 - Present        Sepsis (HCC) ICD-10-CM: A41.9  ICD-9-CM: 038.9, 995.91  07/24/2016 - Present         Asthma exacerbation ICD-10-CM: J45.901  ICD-9-CM: 493.92  07/24/2016 - Present        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: E11.21  ICD-9-CM: 250.40, 583.81  06/12/2016 - Present        Sacroiliitis (HCC) ICD-10-CM: M46.1  ICD-9-CM: 720.2  11/25/2015 - Present        Essential hypertension ICD-10-CM: I10  ICD-9-CM: 401.9  10/06/2015 - Present        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  ICD-9-CM: 721.3  09/15/2015 - Present        Lumbar and sacral osteoarthritis ICD-10-CM: M47.817  ICD-9-CM: 721.3  09/15/2015 - Present        Chronic pain syndrome ICD-10-CM: G89.4  ICD-9-CM: 338.4  09/15/2015 - Present        Type 2 diabetes mellitus with hyperglycemia, without long-term current use of insulin (HCC) ICD-10-CM: E11.65  ICD-9-CM: 250.00, 790.29  08/20/2015 - Present        Acute colitis ICD-10-CM: K52.9  ICD-9-CM: 558.9  07/02/2015 - Present        Acute hyperglycemia ICD-10-CM: R73.9  ICD-9-CM: 790.29  07/02/2015 - Present        Accelerated hypertension ICD-10-CM: I10  ICD-9-CM: 401.0  04/08/2015 - Present        Severe headache ICD-10-CM: R51  ICD-9-CM: 784.0  04/07/2015 - Present        Osteoarthritis of hips, 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  ICD-9-CM: 715.95  01/29/2015 - Present        PTSD (post-traumatic stress disorder) (Chronic) ICD-10-CM: F43.10  ICD-9-CM: 309.81  01/29/2015 - Present        Severe hypertension (Chronic) ICD-10-CM: I10  ICD-9-CM: 401.9  07/21/2014 - Present        RESOLVED: COPD exacerbation (HCC) ICD-10-CM: J44.1  ICD-9-CM: 491.21  11/14/2017 - 08/08/2018        RESOLVED: New onset type 1 diabetes mellitus, uncontrolled (HCC) ICD-10-CM: E10.65  ICD-9-CM: 250.03  07/02/2015 - 10/06/2015        RESOLVED: Recurrent major depressive disorder, in full remission (HCC) ICD-10-CM: F33.42  ICD-9-CM: 296.36  12/02/2014 - 12/02/2014        RESOLVED: Foreign body in colon ICD-10-CM: T18.4XXA  ICD-9-CM: 936  08/14/2014 - 01/29/2015         RESOLVED: Advanced care planning/counseling discussion ICD-10-CM: Z71.89  ICD-9-CM: V65.49  07/21/2014 - 01/29/2015    Overview Signed 07/21/2014  3:46 PM by Littie Deeds     Patient given [place] application               RESOLVED: Hip pain, chronic ICD-10-CM: M25.559, G89.29  ICD-9-CM: 719.45, 338.29  07/21/2014 - 01/29/2015  Medications reviewed  Current Facility-Administered Medications   Medication Dose Route Frequency   ??? [START ON 09/05/2018] loratadine (CLARITIN) tablet 10 mg  10 mg Oral DAILY   ??? albuterol-ipratropium (DUO-NEB) 2.5 MG-0.5 MG/3 ML  3 mL Nebulization Q6H RT   ??? predniSONE (DELTASONE) tablet 40 mg  40 mg Oral DAILY WITH BREAKFAST   ??? dilTIAZem CD (CARDIZEM CD) capsule 240 mg  240 mg Oral DAILY   ??? losartan (COZAAR) tablet 50 mg  50 mg Oral DAILY   ??? polyethylene glycol (MIRALAX) packet 17 g  17 g Oral BID PRN   ??? bisacodyL (DULCOLAX) suppository 10 mg  10 mg Rectal DAILY PRN   ??? guaiFENesin ER (MUCINEX) tablet 1,200 mg  1,200 mg Oral Q12H   ??? LORazepam (ATIVAN) injection 0.5 mg  0.5 mg IntraVENous Q6H PRN   ??? cetirizine (ZYRTEC) tablet 10 mg  10 mg Oral DAILY   ??? fluticasone propionate (FLONASE) 50 mcg/actuation nasal spray 2 Spray  2 Spray Both Nostrils BID   ??? HYDROcodone-acetaminophen (NORCO) 5-325 mg per tablet 1-2 Tab  1-2 Tab Oral Q4H PRN   ??? HYDROmorphone (DILAUDID) injection 0.5-1 mg  0.5-1 mg IntraVENous Q3H PRN   ??? docusate sodium (COLACE) capsule 100 mg  100 mg Oral BID   ??? hydrOXYzine HCL (ATARAX) tablet 25 mg  25 mg Oral TID PRN   ??? cloNIDine HCL (CATAPRES) tablet 0.3 mg  0.3 mg Oral BID   ??? QUEtiapine SR (SEROquel XR) tablet 150 mg  150 mg Oral QHS   ??? revefenacin (YUPELRI) nebulizer solution 175 mcg  175 mcg Nebulization DAILY   ??? dextromethorphan (DELSYM) 30 mg/5 mL syrup 30 mg  30 mg Oral Q12H PRN   ??? gabapentin (NEURONTIN) capsule 300 mg  300 mg Oral TID   ??? montelukast (SINGULAIR) tablet 10 mg  10 mg Oral QHS    ??? naloxone (NARCAN) injection 0.1 mg  0.1 mg IntraVENous PRN   ??? acetaminophen (TYLENOL) tablet 650 mg  650 mg Oral Q4H PRN    Or   ??? acetaminophen (TYLENOL) solution 650 mg  650 mg Oral Q4H PRN    Or   ??? acetaminophen (TYLENOL) suppository 650 mg  650 mg Rectal Q4H PRN   ??? ondansetron (ZOFRAN) injection 4 mg  4 mg IntraVENous Q4H PRN   ??? heparin (porcine) injection 5,000 Units  5,000 Units SubCUTAneous Q8H   ??? arformoteroL (BROVANA) neb solution 15 mcg  15 mcg Nebulization BID RT   ??? budesonide (PULMICORT) 500 mcg/2 ml nebulizer suspension  500 mcg Nebulization BID RT   ??? dextrose (D50W) injection syrg 5-25 g  10-50 mL IntraVENous PRN   ??? glucagon (GLUCAGEN) injection 1 mg  1 mg IntraMUSCular PRN   ??? insulin glargine (LANTUS) injection 1-100 Units  1-100 Units SubCUTAneous QHS   ??? insulin lispro (HUMALOG) injection 1-100 Units  1-100 Units SubCUTAneous AC&HS   ??? insulin lispro (HUMALOG) injection 1-100 Units  1-100 Units SubCUTAneous PRN         [name] Bimler, MD

## 2018-09-04 NOTE — Progress Notes (Addendum)
Pt is progressing, dispo pending pt's progress during continued hospital stay

## 2018-09-04 NOTE — Other (Signed)
Bedside and Verbal shift change report given to Katerina, RN (oncoming nurse) by Amy, RN (offgoing nurse). Report included the following information SBAR and Kardex.

## 2018-09-04 NOTE — Progress Notes (Signed)
SURGERY PROGRESS NOTE  Progress Note    Patient: [name] [name]               Sex: female          DOA: 08/22/2018       Date of Birth:  March 06, 1951      Age:  68 y.o.        LOS:  LOS: 13 days     Chart reviewed.    Assessment and Plan:   [name] [name] is a 68 y.o. year old female s/p chest tube insertion for spontaneous pneumothorax on 3/17.   ??  Chest tube removed yesterday (3/23).  Pt reports no increase in SOB. Breath sounds equal bilaterally.     Pt did not ambulate yesterday.  Encouraged ambulation and OOB to chair as tolerated.     Plan:  1. Stable for discharge from a surgical standpoint. Will sign off and be available as needed.   2. Pt may remove dressing in 2 days.  No need for office f/u.  She will call the office with any concerns.   ??    Problem List:  Active Problems:    Respiratory failure with hypoxia (HCC) (08/22/2018)            Objective:      Vital Signs:  Visit Vitals  BP 128/80 (BP 1 Location: Right arm, BP Patient Position: Supine)   Pulse 95   Temp 98 ??F (36.7 ??C)   Resp 20   Ht 5\' 8"  (1.727 m)   Wt 93.8 kg (206 lb 11.2 oz)   SpO2 94%   BMI 31.43 kg/m??     Temp (24hrs), Avg:98.2 ??F (36.8 ??C), Min:97.7 ??F (36.5 ??C), Max:98.8 ??F (37.1 ??C)    Admission Weight: Last Weight   Weight: 85.3 kg (188 lb) Weight: 93.8 kg (206 lb 11.2 oz)       Intake and Output:  Last three shifts:  03/22 1901 - 03/24 0700  In: 2240 [P.O.:2240]  Out: 3800 [Urine:3800]    Lab Results:  Lab Results   Component Value Date/Time    WBC 19.4 (H) 08/29/2018 06:23 AM    HCT 42.8 08/29/2018 06:23 AM    PLT 266 08/29/2018 06:23 AM      Lab Results   Component Value Date/Time    NA 137 09/04/2018 06:08 AM    K 4.4 09/04/2018 06:08 AM    CL 101 09/04/2018 06:08 AM    CO2 27 09/04/2018 06:08 AM    GLU 152 (H) 09/04/2018 06:08 AM    BUN 30 (H) 09/04/2018 06:08 AM    CREA 0.8 09/04/2018 06:08 AM    CREA 0.8 09/03/2018 06:10 AM    CREA 0.9 09/02/2018 06:42 AM       Medications:  Current Facility-Administered Medications   Medication Dose  Route Frequency   ??? albuterol-ipratropium (DUO-NEB) 2.5 MG-0.5 MG/3 ML  3 mL Nebulization Q6H RT   ??? predniSONE (DELTASONE) tablet 40 mg  40 mg Oral DAILY WITH BREAKFAST   ??? dilTIAZem CD (CARDIZEM CD) capsule 240 mg  240 mg Oral DAILY   ??? losartan (COZAAR) tablet 50 mg  50 mg Oral DAILY   ??? polyethylene glycol (MIRALAX) packet 17 g  17 g Oral BID PRN   ??? bisacodyL (DULCOLAX) suppository 10 mg  10 mg Rectal DAILY PRN   ??? guaiFENesin ER (MUCINEX) tablet 1,200 mg  1,200 mg Oral Q12H   ??? LORazepam (ATIVAN) injection 0.5 mg  0.5 mg IntraVENous Q6H PRN   ??? cetirizine (ZYRTEC) tablet 10 mg  10 mg Oral DAILY   ??? fluticasone propionate (FLONASE) 50 mcg/actuation nasal spray 2 Spray  2 Spray Both Nostrils BID   ??? HYDROcodone-acetaminophen (NORCO) 5-325 mg per tablet 1-2 Tab  1-2 Tab Oral Q4H PRN   ??? HYDROmorphone (DILAUDID) injection 0.5-1 mg  0.5-1 mg IntraVENous Q3H PRN   ??? docusate sodium (COLACE) capsule 100 mg  100 mg Oral BID   ??? hydrOXYzine HCL (ATARAX) tablet 25 mg  25 mg Oral TID PRN   ??? cloNIDine HCL (CATAPRES) tablet 0.3 mg  0.3 mg Oral BID   ??? QUEtiapine SR (SEROquel XR) tablet 150 mg  150 mg Oral QHS   ??? revefenacin (YUPELRI) nebulizer solution 175 mcg  175 mcg Nebulization DAILY   ??? dextromethorphan (DELSYM) 30 mg/5 mL syrup 30 mg  30 mg Oral Q12H PRN   ??? gabapentin (NEURONTIN) capsule 300 mg  300 mg Oral TID   ??? montelukast (SINGULAIR) tablet 10 mg  10 mg Oral QHS   ??? naloxone (NARCAN) injection 0.1 mg  0.1 mg IntraVENous PRN   ??? acetaminophen (TYLENOL) tablet 650 mg  650 mg Oral Q4H PRN    Or   ??? acetaminophen (TYLENOL) solution 650 mg  650 mg Oral Q4H PRN    Or   ??? acetaminophen (TYLENOL) suppository 650 mg  650 mg Rectal Q4H PRN   ??? ondansetron (ZOFRAN) injection 4 mg  4 mg IntraVENous Q4H PRN   ??? heparin (porcine) injection 5,000 Units  5,000 Units SubCUTAneous Q8H   ??? arformoteroL (BROVANA) neb solution 15 mcg  15 mcg Nebulization BID RT   ??? budesonide (PULMICORT) 500 mcg/2 ml nebulizer suspension  500 mcg  Nebulization BID RT   ??? dextrose (D50W) injection syrg 5-25 g  10-50 mL IntraVENous PRN   ??? glucagon (GLUCAGEN) injection 1 mg  1 mg IntraMUSCular PRN   ??? insulin glargine (LANTUS) injection 1-100 Units  1-100 Units SubCUTAneous QHS   ??? insulin lispro (HUMALOG) injection 1-100 Units  1-100 Units SubCUTAneous AC&HS   ??? insulin lispro (HUMALOG) injection 1-100 Units  1-100 Units SubCUTAneous PRN         Mariam Dollar, PA-C  September 04, 2018  10:13 AM

## 2018-09-04 NOTE — Progress Notes (Signed)
Pt is progressing, dispo pending pt's progress during continued hospital stay

## 2018-09-04 NOTE — Progress Notes (Signed)
Progress  Notes by [name], MD at 09/04/18 1606                Author: [name], MD  Service: Hospitalist  Author Type: Physician       Filed: 09/05/18 1155  Date of Service: 09/04/18 1606  Status: Signed          Editor: [name], MD (Physician)                                    Hospitalist Progress Note   [name], MD   [facility]      Daily Progress Note: 09/04/2018        Assessment/Plan:        Acute hypoxic respiratory failure    Pt was on BiPAP   Pt noted she does not use O2 at home   Will continue to wean O2   Check the patient's O2 sats off oxygen she was 92% at rest   We will mobilize the patient and will see if we wean the patient off of O2.      Acute L pneumothorax   S/p Chest tube (removed 3/23)   Follow-up pCxR 3/24 with small apical pneumothorax smaller than on 3/23      Acute COPD exacerbation   Seems to be resolved.   Initially on IV steroid, now weaned down to p.o. prednisone that will taper   Continue Brovana nebulizer, Pulmicort nebulizer, Zyrtec, Singulair      HTN   Controlled.     Continue clonidine patch, Cardizem CD 240 mg daily, losartan 50 mg daily      DM2   Glucomander protocol      HLD   Resume Crestor 5 mg daily      Peripheral neuropathy   Continue Neurontin 300 mg TID      PTSD   Patient is on Seroquel 100 mg at bedtime      2 cm LLL nodule   Patient will need follow-up as an outpatient with repeat CT scan      DVT prophylaxis    Heparin SQ      CODE STATUS.  Full code      Disposition.   Will try to wean the patient off of O2 and mobilize the patient.   Likely on by the end of the week         ------------------      36 minutes were spent on the patient of which more than 50% was spent in coordination of care and counseling (time spent with patient/family face to face, physical exam, reviewing laboratory and imaging investigations, speaking with physicians and nursing  staff involved in this patient's care)      ------------------   Physical  exam:   General:   Alert, NAD, pleasant.  Cooperative   HEENT: Sclera anicteric, PERRL, OM moist, throat clear.   Chest:   CTA B, no wheeze, no rales, no rhonchi.  Fair air movement.   CV: RRR, S1/S2 were normal, no murmur   Abdomen: NTND, soft, NABS, no masses were noted.  No rebound, no guarding.   Extremities: 1+ leg  edema.        Subjective:     Getting better.  Patient notes she is less short of breath.  No wheezing at this time.  She notes that she is not on oxygen  at home.  We discussed weaning the oxygen to off and also  mobilizing her           Objective:        Visit Vitals      BP  125/80 (BP 1 Location: Right arm, BP Patient Position: Supine)     Pulse  85     Temp  98.2 ??F (36.8 ??C)     Resp  20     Ht  5\' 8"  (1.727 m)     Wt  93.8 kg (206 lb 11.2 oz)     SpO2  97%        BMI  31.43 kg/m??      O2 Flow Rate (L/min): 2 l/min  O2 Device: Room air      Temp (24hrs), Avg:98.1 ??F (36.7 ??C), Min:97.7 ??F (36.5 ??C), Max:98.6 ??F (37 ??C)     03/24 0701 - 03/24 1900   In: 720 [P.O.:720]   Out: 1000 [Urine:1000]   03/22 1901 - 03/24 0700   In: 2240 [P.O.:2240]   Out: 3800 [Urine:3800]           Data Review:          Recent Days:   No results for input(s): WBC, HGB, HCT, PLT, HGBEXT, HCTEXT, PLTEXT in the last 72 hours.     Recent Labs             09/04/18   0608  09/03/18   0610  09/02/18   0642     NA  137  134*  132*     K  4.4  4.9  5.0     CL  101  101  99     CO2  27  29  28      GLU  152*  165*  251*     BUN  30*  35*  40*     CREA  0.8  0.8  0.9     CA  8.3*  8.7  8.3*     PHOS  3.7  2.8  2.5          ALB  2.6*  2.7*  2.7*        No results for input(s): PH, PCO2, PO2, HCO3, FIO2 in the last 72 hours.      24 Hour Results:     Recent Results (from the past 24 hour(s))     GLUCOSE, POC          Collection Time: 09/03/18  5:04 PM         Result  Value  Ref Range            Glucose (POC)  172 (H)  65 - 105 mg/dL       GLUCOSE, POC          Collection Time: 09/03/18  9:11 PM         Result  Value  Ref Range             Glucose (POC)  251 (H)  65 - 105 mg/dL       RENAL FUNCTION PANEL          Collection Time: 09/04/18  6:08 AM         Result  Value  Ref Range            Sodium  137  136 - 145 mEq/L       Potassium  4.4  3.5 - 5.1  mEq/L       Chloride  101  98 - 107 mEq/L       CO2  27  21 - 32 mEq/L       Glucose  152 (H)  74 - 106 mg/dl       BUN  30 (H)  7 - 25 mg/dl       Creatinine  0.8  0.6 - 1.3 mg/dl       GFR est AA  >16.1>60.0          GFR est non-AA  >60          Calcium  8.3 (L)  8.5 - 10.1 mg/dl       Albumin  2.6 (L)  3.4 - 5.0 gm/dl       Phosphorus  3.7  2.5 - 4.9 mg/dl       Anion gap  9  5 - 15 mmol/L       GLUCOSE, POC          Collection Time: 09/04/18  7:49 AM         Result  Value  Ref Range            Glucose (POC)  215 (H)  65 - 105 mg/dL       GLUCOSE, POC          Collection Time: 09/04/18 11:50 AM         Result  Value  Ref Range            Glucose (POC)  126 (H)  65 - 105 mg/dL           Xr Chest Sngl V      Result Date: 09/04/2018   Clinical history: Chest pressure, chest tube removal EXAMINATION: Single view of the chest 09/04/2018 Correlation: Chest radiograph 09/03/2018 and CT scan 08/30/2018 FINDINGS: Interval removal of left chest tube. Small left apical pneumothorax. Left perihilar  opacity corresponds to atelectasis on CT scan. 1.7 cm left lower lobe nodule. Right lung is clear.       IMPRESSION: 1. Removal of right chest tube, small right apical pneumothorax. 2. Left perihilar opacity corresponding to atelectasis. 3. 1.7 cm left lower lobe nodule.       Xr Chest Sngl V      Result Date: 09/03/2018   Exam: AP portable chest Clinical indication: Dyspnea Comparison: September 01, 2018 Results:  No consolidation.  No pleural effusions.    17 mm nodule left mid chest. Bullous disease left upper lobe. Persistent left upper lobe pneumothorax, unchanged. Left  mid chest opacity. Unchanged. Heart normal.  Mediastinal contours are normal. Left chest tube unchanged. No free air is seen under the hemidiaphragms.    Osseous structures intact.       IMPRESSION:   1.  Persistent episode left upper lobe pneumothorax, unchanged. Left lung nodule. Stable bullous disease. 2. Left mid lung opacities likely representing atelectasis/consolidation, unchanged. 3. Left chest tube unchanged.       Xr Chest Sngl V      Result Date: 09/01/2018   INDICATION: Pneumothorax.    COMPARISON: 08/31/2018 TECHNIQUE: Single view chest.       IMPRESSION: There is left lung pulmonary nodule, unchanged. Subtle left 10% apical pneumothorax, relatively unchanged. There is left-sided chest tube. Patchy infiltrates in the right lower lung zone, slightly increased since prior study.       Xr Chest Sngl V      Result Date: 08/31/2018  INDICATION: Pneumothorax   EXAMINATION: XR CHEST SNGL V COMPARISON: 08/30/2018 FINDINGS: The study shows a normal sized heart.. Partially 10 and 15% persistent left pneumothorax. 18 mm left lung nodule. Indwelling left chest tube.Marland Kitchen          IMPRESSION: 1. 18 mm left lung nodule. 2. 15% left pneumothorax. 3. Indwelling left chest tube.       Xr Chest Sngl V      Result Date: 08/30/2018   INDICATION: Pneumothorax   EXAMINATION: XR CHEST SNGL V COMPARISON: 08/29/2018 FINDINGS: The study shows a normal sized heart.. Proximally 10-15% residual left apical pneumothorax.. Indwelling left chest tube. Left lung mass..          IMPRESSION: 1. Similar 10-15% left pneumothorax. 2. Indwelling left chest tube. 3. Left lung mass.       Xr Chest Sngl V      Result Date: 08/29/2018   EXAMINATION: XR CHEST SNGL V CLINICAL INDICATION: Pneumothorax     COMPARISON:  August 28, 2018 TECHNIQUE: AP portable chest FINDINGS: 15 mm nodule left mid chest. Chest tube left mid to lower lung. Tip now directed medially. Marked improvement of the  left pneumothorax. Left apical lines medially and a line to the left of the aortic arch suggest residual pneumothorax. Lucency in left mid chest may be due to a superimposed loculated pneumothorax or bulla. Mild right basilar  infiltrate. Mild cardiomegaly.       IMPRESSION:  1. Marked reduction in the left-sided pneumothorax. Likely less than 15%-20% 2. Left lung nodule. 3. Left chest tube with tip now directed medially       Xr Chest Sngl V      Result Date: 08/28/2018   Clinical history: Chest tube placement EXAMINATION: Single view of the chest 08/28/2018 Correlation: CT scan 08/28/2018 FINDINGS: Trachea and heart size are within normal limits. Right lung is clear. There are left lung nodules. Placement of left chest  tube. Small left apical lateral pneumothorax.       IMPRESSION: 1. Placement of left chest tube, left apical lateral pneumothorax. 2. Left lung nodules.       Xr Chest Sngl V      Result Date: 08/28/2018   EXAM:  Chest AP INDICATIONS: sob, COMPARISON: 08/22/2018. FINDINGS: New hyperlucency of the left upper lung zone with a triangular opacity in the left midlung zone. Findings are concerning for pneumothorax with associated partial atelectasis/collapse of  the upper lobe. Stable nodular density in the left lower lung zone. The right lung is clear. Normal cardiomediastinal contours.       IMPRESSION: New large left pneumothorax. Results discussed with Dr. Laveda Norman on 08/28/2018 at 11:40 AM.       Xr Chest Sngl V      Result Date: 08/22/2018   Clinical history: Shortness of breath EXAMINATION: Single view of the chest 08/22/2018 Correlation: Chest radiograph 08/04/2018 and CTA chest 02/18/2018 FINDINGS: Trachea and heart size are within normal limits. Scarring/atelectasis left mid to lower lung.  2 cm left lower lobe nodule unchanged from chest radiograph of 08/04/2018 and CTA chest 02/18/2018. Right lung is clear.       IMPRESSION: 1. No acute pulmonary process. 2. 2 cm stable nodule left lower lobe unchanged from 08/04/2018 and evident on CT scan 02/18/2018.       Mri Brain Wo Cont      Result Date: 08/08/2018   EXAMINATION: MRI BRAIN WO CONT CLINICAL INDICATION: intractable HA     COMPARISON:  April 09, 2015  TECHNIQUE: T1 and T2-weighted  sequences of the brain were obtained in multiple planes without contrast. FINDINGS: No restricted diffusion. Stable left  posterior fossa dural based mass 10 mm, abutting tentorium. Isointense on T1 and T2. Findings typical of meningioma. Stable left frontal scalp subgaleal lipoma. Small right frontal subgaleal lipoma. No  no midline shift. No hydrocephalus. No extra-axial  fluid collections. Visualized portions of the orbits grossly unremarkable. Mastoid air cells well aerated. Visualized portions of paranasal sinuses demonstrate mucosal thickening posterior right ethmoid air cells and right sphenoid sinus. Intracranial  flow voids of great vessels, grossly intact. Extensive confluent T2 prolongation in corona radiata and centrum semiovale, more than expected for patient's age. Extensive subcortical T2 prolongation in the frontoparietal lobes. T2 prolongation central  pons. Marked frontotemporal lobe atrophy. No susceptibility artifact on gradient echo imaging to suggest hemorrhage. Cerebellar tonsil normal.            IMPRESSION: 1.   Stable left posterior fossa dural based mass. Long-term stability would be consistent with meningioma. 2. No acute infarction. 3. Extensive chronic microvascular ischemic change       Ct Chest Wo Cont      Result Date: 08/30/2018   Indication: Apical pneumothorax versus blebs following chest tube insertion.       IMPRESSION: Large biapical subpleural blebs and bullae. No pneumothorax. 17 mm left lung nodule. Comment: CT images of the chest were obtained without intravenous contrast. This was compared with all recent studies. A 9 French left chest tube is in place.  There is no pneumothorax. Subpleural blebs and bullae are present in both lungs particularly in the upper lobes creating the lines simulating left pneumothorax on plain films. A 17 mm left lower lobe nodule. Atelectatic consolidation is present in the  left upper lobe. There is minimal subsegmental atelectasis at the right  lung base. The heart is of normal size. Aorta is of normal caliber. Focal calcification present in the left breast. These findings were discussed with Javier Glazier, PA.Marland Kitchen DICOM format  imaged data is available to non-affiliated external healthcare facilities or entities on a secure, media free, reciprocally searchable basis with patient authorization  for 12 months following the date of the study.       Ct Guide Insrt Chest Tube      Result Date: 08/28/2018   Indication: Spontaneous left pneumothorax.       IMPRESSION: Using aseptic technique under CT guidance 9 French left chest tube was placed and connected to continuous low pressure suction. The pneumothorax was evacuated. The catheter was secured in place. There were no complications and the patient  left the radiology department in satisfactory condition. 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 Fentanyl sedation was utilized by the radiology nurse. Start time 1444 and end time 1504. DICOM format imaged data is available to non-affiliated external  healthcare facilities or entities on a secure, media free, reciprocally searchable basis with patient authorization  for 12 months following the date of the study.       Duplex Temporal Art Right Ltd      Result Date: 08/07/2018                                                                 Study ID: [id number]  [facility]                                            [address] [place],                                         [place]                                           [id number]                          Temporal Artery Duplex Report Name: WITNEY, HUIE          Study Date: 08/07/2018 09:54 AM MRN: [id number]                    Patient Location:  [id number] DOB: 01-May-1951                Age: 29 yrs Gender:  Female                 Account #: [id number] Reason For Study: Headaches Ordering Physician: Scarlette Ar Performed By: Eddie North, RVT Interpretation Summary Normal velocities were dected in all vessels. No sign of halo detected around the  parietal, frontal and temporal arteries. _____________________________________________________________________________ __ Quality/Procedure Limited color duplex ultrasound of the temporal arteries was performed. ICD 10: R51. History/symptoms Diabetes.  HTN. COPD. Headaches. Right Superficial Temporal Artery Velocities RT: Proximal superficial temporal branch 39.6cm/s. RT: Mid superficial temporal branch 45.3cm/s. RT: Distal superficial temporal branch 40.6cm/s. Right Frontal Branch Velocities RT: Distal  frontal branch 21.2cm/s. RT: Mid frontal branch 15.3cm/s. RT: Proximal frontal branch 27.8cm/s. Right Parietal Branch Velocities RT: Proximal parietal branch 38.4cm/s. RT: Mid parietal branch 24.9cm/s. RT: Distal parietal branch 30.3cm/s. Electronically  signed byDr. Neta Ehlers, M.D   08/07/2018 04:43 PM         Microbiology:     All Micro Results  Procedure  Component  Value  Units  Date/Time           CULTURE, RESPIRATORY/SPUTUM/BRONCH Berniece Andreas [[id number]]  (Abnormal)  Collected:  08/24/18 1554            Order Status:  Completed  Specimen:  Respiratory specimen from Sputum  Updated:  08/27/18 1457               GRAM STAIN                 Rare Gram Positive Cocci In Clusters   Rare Gram Positive Bacilli   Few Gram Positive Cocci In Chains   <10 Epithelial cells/lpf   10 - 25 WBC's/lpf   Mucus Present                    Culture result                 Moderate Commensal Flora Isolated                               Cardiology:     Results for orders placed or performed during the hospital encounter of 08/22/18     EKG, 12 LEAD, INITIAL         Result  Value  Ref Range             Ventricular Rate  120  BPM       Atrial Rate  120  BPM       P-R Interval  144  ms       QRS Duration  72  ms       Q-T Interval  298  ms       QTC Calculation (Bezet)  421  ms       Calculated P Axis  72  degrees       Calculated R Axis  70  degrees       Calculated T Axis  66  degrees       Diagnosis                 Sinus tachycardia   Otherwise normal ECG   When compared with ECG of 04-Aug-2018 23:00,   premature supraventricular complexes are no longer present   Nonspecific T wave abnormality no longer evident in Inferior leads   Nonspecific T wave abnormality no longer evident in Lateral leads   Confirmed by Yetta Barre, M.D., John P ([id number]) on 08/28/2018 4:43:46 PM                 Problem List:      Problem List as of 09/04/2018  Date Reviewed:  08/17/2018                        Codes  Class  Noted - Resolved             Respiratory failure with hypoxia (HCC)  ICD-10-CM: J96.91   ICD-9-CM: 518.81    08/22/2018 - Present                       Headache  ICD-10-CM: R51   ICD-9-CM: 784.0    08/05/2018 - Present                       Abdominal pain  ICD-10-CM: R10.9   ICD-9-CM: 789.00  02/04/2018 - Present                       SIRS (systemic inflammatory response syndrome) (HCC)  ICD-10-CM: R65.10   ICD-9-CM: 995.90    10/09/2017 - Present                       Hyperkalemia  ICD-10-CM: E87.5   ICD-9-CM: 276.7    09/08/2017 - Present                       Obtunded  ICD-10-CM: R40.1   ICD-9-CM: 780.09    09/08/2017 - Present                       Acute renal failure (ARF) (HCC)  ICD-10-CM: N17.9   ICD-9-CM: 584.9    09/08/2017 - Present                       Septic shock (HCC)  ICD-10-CM: A41.9, R65.21   ICD-9-CM: 038.9, 785.52, 995.92    09/08/2017 - Present                       Metabolic encephalopathy  ICD-10-CM: G93.41   ICD-9-CM: 348.31    09/08/2017 - Present                       Dehydration  ICD-10-CM: E86.0   ICD-9-CM: 276.51    08/07/2017 - Present                       COPD with acute exacerbation (HCC)  ICD-10-CM: J44.1    ICD-9-CM: 491.21    08/07/2017 - Present                       Acute-on-chronic kidney injury (HCC)  ICD-10-CM: N17.9, N18.9   ICD-9-CM: 584.9, 585.9    08/07/2017 - Present                       Chest pain  ICD-10-CM: R07.9   ICD-9-CM: 786.50    06/15/2017 - Present                       Dyspnea  ICD-10-CM: R06.00   ICD-9-CM: 786.09    06/15/2017 - Present                       Type 2 diabetes mellitus with diabetic neuropathy (HCC)  ICD-10-CM: E11.40   ICD-9-CM: 250.60, 357.2    12/07/2016 - Present                       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   ICD-9-CM: 569.89    08/17/2016 - Present                       Cannabinoid hyperemesis syndrome (HCC)  ICD-10-CM: F12.988   ICD-9-CM: 536.2, 305.20    08/17/2016 - Present                       Gastritis  ICD-10-CM: K29.70   ICD-9-CM: 535.50    08/16/2016 - Present  Nausea & vomiting  ICD-10-CM: R11.2   ICD-9-CM: 787.01    08/16/2016 - Present                       Asthma with acute exacerbation  ICD-10-CM: J45.901   ICD-9-CM: 493.92    08/04/2016 - Present                       Lactic acidosis  ICD-10-CM: E87.2   ICD-9-CM: 276.2    07/24/2016 - Present                       Leukocytosis  ICD-10-CM: D72.829   ICD-9-CM: 288.60    07/24/2016 - Present                       Sepsis (HCC)  ICD-10-CM: A41.9   ICD-9-CM: 038.9, 995.91    07/24/2016 - Present                       Asthma exacerbation  ICD-10-CM: J45.901   ICD-9-CM: 493.92    07/24/2016 - Present                       Type 2 diabetes mellitus with nephropathy (HCC)  ICD-10-CM: E11.21   ICD-9-CM: 250.40, 583.81    06/12/2016 - Present                       Sacroiliitis (HCC)  ICD-10-CM: M46.1   ICD-9-CM: 720.2    11/25/2015 - Present                       Essential hypertension  ICD-10-CM: I10   ICD-9-CM: 401.9    10/06/2015 - Present                       Spondylosis of lumbar region without myelopathy or radiculopathy  ICD-10-CM: M47.816   ICD-9-CM: 721.3    09/15/2015 - Present                        Lumbar and sacral osteoarthritis  ICD-10-CM: M47.817   ICD-9-CM: 721.3    09/15/2015 - Present                       Chronic pain syndrome  ICD-10-CM: G89.4   ICD-9-CM: 338.4    09/15/2015 - Present                       Type 2 diabetes mellitus with hyperglycemia, without long-term current use of insulin (HCC)  ICD-10-CM: E11.65   ICD-9-CM: 250.00, 790.29    08/20/2015 - Present                       Acute colitis  ICD-10-CM: K52.9   ICD-9-CM: 558.9    07/02/2015 - Present                       Acute hyperglycemia  ICD-10-CM: R73.9   ICD-9-CM: 790.29    07/02/2015 - Present                       Accelerated hypertension  ICD-10-CM: I10   ICD-9-CM: 401.0    04/08/2015 -  Present                       Severe headache  ICD-10-CM: R51   ICD-9-CM: 784.0    04/07/2015 - Present                       Osteoarthritis of hips, bilateral  ICD-10-CM: M16.0   ICD-9-CM: 715.95    01/29/2015 - Present                       PTSD (post-traumatic stress disorder) (Chronic)  ICD-10-CM: F43.10   ICD-9-CM: 309.81    01/29/2015 - Present                       Severe hypertension (Chronic)  ICD-10-CM: I10   ICD-9-CM: 401.9    07/21/2014 - Present                       RESOLVED: COPD exacerbation (HCC)  ICD-10-CM: J44.1   ICD-9-CM: 491.21    11/14/2017 - 08/08/2018                       RESOLVED: New onset type 1 diabetes mellitus, uncontrolled (HCC)  ICD-10-CM: E10.65   ICD-9-CM: 250.03    07/02/2015 - 10/06/2015                       RESOLVED: Recurrent major depressive disorder, in full remission (HCC)  ICD-10-CM: F33.42   ICD-9-CM: 296.36    12/02/2014 - 12/02/2014                       RESOLVED: Foreign body in colon  ICD-10-CM: T18.4XXA   ICD-9-CM: 936    08/14/2014 - 01/29/2015                       RESOLVED: Advanced care planning/counseling discussion  ICD-10-CM: Z71.89   ICD-9-CM: V65.49    07/21/2014 - 01/29/2015          Overview Signed 07/21/2014  3:46 PM by Littie Deeds            Patient given [place] application                                       RESOLVED: Hip pain, chronic  ICD-10-CM: M25.559, G89.29   ICD-9-CM: 719.45, 338.29    07/21/2014 - 01/29/2015                          Medications reviewed     Current Facility-Administered Medications          Medication  Dose  Route  Frequency           ?  [START ON 09/05/2018] loratadine (CLARITIN) tablet 10 mg   10 mg  Oral  DAILY     ?  albuterol-ipratropium (DUO-NEB) 2.5 MG-0.5 MG/3 ML   3 mL  Nebulization  Q6H RT     ?  predniSONE (DELTASONE) tablet 40 mg   40 mg  Oral  DAILY WITH BREAKFAST     ?  dilTIAZem CD (CARDIZEM CD) capsule 240 mg   240 mg  Oral  DAILY     ?  losartan (COZAAR) tablet 50 mg   50 mg  Oral  DAILY     ?  polyethylene glycol (MIRALAX) packet 17 g   17 g  Oral  BID PRN     ?  bisacodyL (DULCOLAX) suppository 10 mg   10 mg  Rectal  DAILY PRN     ?  guaiFENesin ER (MUCINEX) tablet 1,200 mg   1,200 mg  Oral  Q12H     ?  LORazepam (ATIVAN) injection 0.5 mg   0.5 mg  IntraVENous  Q6H PRN           ?  cetirizine (ZYRTEC) tablet 10 mg   10 mg  Oral  DAILY           ?  fluticasone propionate (FLONASE) 50 mcg/actuation nasal spray 2 Spray   2 Spray  Both Nostrils  BID     ?  HYDROcodone-acetaminophen (NORCO) 5-325 mg per tablet 1-2 Tab   1-2 Tab  Oral  Q4H PRN     ?  HYDROmorphone (DILAUDID) injection 0.5-1 mg   0.5-1 mg  IntraVENous  Q3H PRN     ?  docusate sodium (COLACE) capsule 100 mg   100 mg  Oral  BID     ?  hydrOXYzine HCL (ATARAX) tablet 25 mg   25 mg  Oral  TID PRN     ?  cloNIDine HCL (CATAPRES) tablet 0.3 mg   0.3 mg  Oral  BID     ?  QUEtiapine SR (SEROquel XR) tablet 150 mg   150 mg  Oral  QHS     ?  revefenacin (YUPELRI) nebulizer solution 175 mcg   175 mcg  Nebulization  DAILY     ?  dextromethorphan (DELSYM) 30 mg/5 mL syrup 30 mg   30 mg  Oral  Q12H PRN     ?  gabapentin (NEURONTIN) capsule 300 mg   300 mg  Oral  TID     ?  montelukast (SINGULAIR) tablet 10 mg   10 mg  Oral  QHS     ?  naloxone (NARCAN) injection 0.1 mg   0.1 mg  IntraVENous  PRN     ?  acetaminophen  (TYLENOL) tablet 650 mg   650 mg  Oral  Q4H PRN          Or           ?  acetaminophen (TYLENOL) solution 650 mg   650 mg  Oral  Q4H PRN          Or           ?  acetaminophen (TYLENOL) suppository 650 mg   650 mg  Rectal  Q4H PRN     ?  ondansetron (ZOFRAN) injection 4 mg   4 mg  IntraVENous  Q4H PRN     ?  heparin (porcine) injection 5,000 Units   5,000 Units  SubCUTAneous  Q8H           ?  arformoteroL (BROVANA) neb solution 15 mcg   15 mcg  Nebulization  BID RT           ?  budesonide (PULMICORT) 500 mcg/2 ml nebulizer suspension   500 mcg  Nebulization  BID RT     ?  dextrose (D50W) injection syrg 5-25 g   10-50 mL  IntraVENous  PRN     ?  glucagon (GLUCAGEN) injection 1 mg   1 mg  IntraMUSCular  PRN     ?  insulin glargine (LANTUS) injection 1-100 Units   1-100 Units  SubCUTAneous  QHS     ?  insulin lispro (HUMALOG) injection 1-100 Units   1-100 Units  SubCUTAneous  AC&HS           ?  insulin lispro (HUMALOG) injection 1-100 Units   1-100 Units  SubCUTAneous  PRN              Kristen Loader, MD

## 2018-09-05 LAB — RENAL FUNCTION PANEL
Albumin: 2.4 gm/dl — ABNORMAL LOW (ref 3.4–5.0)
Albumin: 2.4 gm/dl — ABNORMAL LOW (ref 3.4–5.0)
Anion Gap: 6 mmol/L (ref 5–15)
Anion gap: 6 mmol/L (ref 5–15)
BUN: 29 mg/dl — ABNORMAL HIGH (ref 7–25)
BUN: 29 mg/dl — ABNORMAL HIGH (ref 7–25)
CO2: 30 mEq/L (ref 21–32)
CO2: 30 mEq/L (ref 21–32)
Calcium: 8.6 mg/dl (ref 8.5–10.1)
Calcium: 8.6 mg/dl (ref 8.5–10.1)
Chloride: 100 mEq/L (ref 98–107)
Chloride: 100 mEq/L (ref 98–107)
Creatinine: 0.8 mg/dl (ref 0.6–1.3)
Creatinine: 0.8 mg/dl (ref 0.6–1.3)
EGFR IF NonAfrican American: >60
GFR African American: >60
GFR est AA: >60
GFR est non-AA: >60
Glucose: 156 mg/dl — ABNORMAL HIGH (ref 74–106)
Glucose: 156 mg/dl — ABNORMAL HIGH (ref 74–106)
Phosphorus: 4.1 mg/dl (ref 2.5–4.9)
Phosphorus: 4.1 mg/dl (ref 2.5–4.9)
Potassium: 4.4 mEq/L (ref 3.5–5.1)
Potassium: 4.4 mEq/L (ref 3.5–5.1)
Sodium: 136 mEq/L (ref 136–145)
Sodium: 136 mEq/L (ref 136–145)

## 2018-09-05 LAB — POCT GLUCOSE
POC Glucose: 133 mg/dL — ABNORMAL HIGH (ref 65–105)
POC Glucose: 125 mg/dL — ABNORMAL HIGH (ref 65–105)
POC Glucose: 123 mg/dL — ABNORMAL HIGH (ref 65–105)
POC Glucose: 67 mg/dL (ref 65–105)
POC Glucose: 262 mg/dL — ABNORMAL HIGH (ref 65–105)

## 2018-09-05 LAB — GLUCOSE, POC
Glucose (POC): 133 mg/dL — ABNORMAL HIGH (ref 65–105)
Glucose (POC): 125 mg/dL — ABNORMAL HIGH (ref 65–105)
Glucose (POC): 123 mg/dL — ABNORMAL HIGH (ref 65–105)
Glucose (POC): 67 mg/dL (ref 65–105)
Glucose (POC): 262 mg/dL — ABNORMAL HIGH (ref 65–105)

## 2018-09-05 MED ORDER — ROSUVASTATIN 5 MG TAB
5 mg | Freq: Every evening | ORAL | Status: DC
Start: 2018-09-05 — End: 2018-09-07
  Administered 2018-09-06 – 2018-09-07 (×2): via ORAL

## 2018-09-05 MED FILL — CETIRIZINE 5 MG TAB: 5 mg | ORAL | Qty: 2

## 2018-09-05 MED FILL — MUCINEX 600 MG TABLET, EXTENDED RELEASE: 600 mg | ORAL | Qty: 2

## 2018-09-05 MED FILL — DILTIAZEM ER 120 MG 24 HR CAP: 120 mg | ORAL | Qty: 2

## 2018-09-05 MED FILL — MONTELUKAST 10 MG TAB: 10 mg | ORAL | Qty: 1

## 2018-09-05 MED FILL — BROVANA 15 MCG/2 ML SOLUTION FOR NEBULIZATION: 15 mcg/2 mL | RESPIRATORY_TRACT | Qty: 1

## 2018-09-05 MED FILL — HEPARIN (PORCINE) 5,000 UNIT/ML IJ SOLN: 5000 unit/mL | INTRAMUSCULAR | Qty: 1

## 2018-09-05 MED FILL — HYDROMORPHONE 1 MG/ML INJECTION SOLUTION: 1 mg/mL | INTRAMUSCULAR | Qty: 1

## 2018-09-05 MED FILL — DOCUSATE SODIUM 100 MG CAP: 100 mg | ORAL | Qty: 1

## 2018-09-05 MED FILL — IPRATROPIUM-ALBUTEROL 2.5 MG-0.5 MG/3 ML NEB SOLUTION: 2.5 mg-0.5 mg/3 ml | RESPIRATORY_TRACT | Qty: 3

## 2018-09-05 MED FILL — PREDNISONE 20 MG TAB: 20 mg | ORAL | Qty: 2

## 2018-09-05 MED FILL — LOSARTAN 50 MG TAB: 50 mg | ORAL | Qty: 1

## 2018-09-05 MED FILL — CLONIDINE 0.1 MG TAB: 0.1 mg | ORAL | Qty: 3

## 2018-09-05 MED FILL — GABAPENTIN 300 MG CAP: 300 mg | ORAL | Qty: 1

## 2018-09-05 MED FILL — BUDESONIDE 0.5 MG/2 ML NEB SUSPENSION: 0.5 mg/2 mL | RESPIRATORY_TRACT | Qty: 1

## 2018-09-05 MED FILL — YUPELRI 175 MCG/3 ML SOLUTION FOR NEBULIZATION: 175 mcg/3 mL | RESPIRATORY_TRACT | Qty: 1

## 2018-09-05 MED FILL — LORATADINE 10 MG TAB: 10 mg | ORAL | Qty: 1

## 2018-09-05 MED FILL — QUETIAPINE SR 50 MG 24 HR TAB: 50 mg | ORAL | Qty: 3
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
## 2018-09-05 NOTE — Progress Notes (Signed)
Problem: Gas Exchange - Impaired  Goal: *Absence of hypoxia  Outcome: Progressing Towards Goal     Problem: Patient Education: Go to Patient Education Activity  Goal: Patient/Family Education  Outcome: Progressing Towards Goal     Problem: Gas Exchange - Impaired  Goal: *Absence of hypoxia  Outcome: Progressing Towards Goal     Problem: Falls - Risk of  Goal: *Absence of Falls  Description: Document Schmid Fall Risk and appropriate interventions in the flowsheet.  Outcome: Progressing Towards Goal  Note: Fall Risk Interventions:  Mobility Interventions: Patient to call before getting OOB         Medication Interventions: Patient to call before getting OOB, Bed/chair exit alarm, Teach patient to arise slowly    Elimination Interventions: Bed/chair exit alarm, Call light in reach, Patient to call for help with toileting needs              Problem: Pressure Injury - Risk of  Goal: *Prevention of pressure injury  Description: Document Braden Scale and appropriate interventions in the flowsheet.  Outcome: Progressing Towards Goal  Note: Pressure Injury Interventions:       Moisture Interventions: Maintain skin hydration (lotion/cream), Moisture barrier, Apply protective barrier, creams and emollients    Activity Interventions: Pressure redistribution bed/mattress(bed type)    Mobility Interventions: Pressure redistribution bed/mattress (bed type), HOB 30 degrees or less, Float heels    Nutrition Interventions: Document food/fluid/supplement intake, Offer support with meals,snacks and hydration    Friction and Shear Interventions: Apply protective barrier, creams and emollients, Foam dressings/transparent film/skin sealants                Problem: Patient Education: Go to Patient Education Activity  Goal: Patient/Family Education  Outcome: Progressing Towards Goal     Problem: Airway Clearance - Ineffective  Goal: *Patent airway  Outcome: Progressing Towards Goal  Goal: *Absence of airway secretions   Outcome: Progressing Towards Goal

## 2018-09-05 NOTE — Progress Notes (Signed)
Results for Reish, Gloriann R (MRN [id number]) as of 09/05/2018 13:47   Ref. Range 09/05/2018 12:01 09/05/2018 12:25   GLUCOSE,FAST - POC Latest Ref Range: 65 - 105 mg/dL 67 123 (H)   Pt is asymptomatic, gave lunch tray recheck BG.   Pt eating lunch.   Denies any discomfort or distress.   Charge RN made aware.   Will continue to monitor.

## 2018-09-05 NOTE — Progress Notes (Signed)
Pt refuses to take norco tablet, pt prefers to take dilaudid IV instead of norco tablet.   This RN explained why it is better to start taking po pain medicine.   Pt stated that she prefers to take IV while she is in the hospital and she will deal with po when she d/c.

## 2018-09-05 NOTE — Progress Notes (Signed)
Hospitalist Progress Note  [name] Bimler, MD  [facility]    Daily Progress Note: 09/05/2018    Assessment/Plan:     Acute hypoxic respiratory failure   Pt was on BiPAP  Pt noted she does not use O2 at home  Will continue to wean O2  We will mobilize the patient and will see if we wean the patient off of O2.  If this is not possible, we will have the pt get a Home O2 evaluation and plan for home O2    Acute L pneumothorax  S/p Chest tube (removed 3/23)  Follow-up pCxR 3/24 with small apical pneumothorax smaller than on 3/23    Acute COPD exacerbation  Seems to be resolved.  Initially on IV steroid, now weaned down to p.o. prednisone that will taper  Continue Brovana nebulizer, Pulmicort nebulizer, Zyrtec, Singulair    HTN  Controlled.    Continue clonidine patch, Cardizem CD 240 mg daily, losartan 50 mg daily    DM2  Glucomander protocol    HLD  Resume Crestor 5 mg daily    Peripheral neuropathy  Continue Neurontin 300 mg TID    PTSD  Patient is on Seroquel 100 mg at bedtime    2 cm LLL nodule  Patient will need follow-up as an outpatient with repeat CT scan    DVT prophylaxis   Heparin SQ    CODE STATUS.  Full code    Disposition.  Will try to wean the patient off of O2 and mobilize the patient.  Pt declined SNF  Plan on d/c home with home health PT/OT/RN and possibly Home O2 likely on by the end of the week      ------------------    36 minutes were spent on the patient of which more than 50% was spent in coordination of care and counseling (time spent with patient/family face to face, physical exam, reviewing laboratory and imaging investigations, speaking with physicians and nursing staff involved in this patient's care)    ------------------  Physical exam:  General:  Alert, NAD, pleasant.  Cooperative  HEENT: Sclera anicteric, PERRL, OM moist, throat clear.  Chest:  CTA B, no wheeze, no rales, no rhonchi.  Fair air movement.  CV: RRR, S1/S2 were normal, no murmur   Abdomen: NTND, soft, NABS, no masses were noted.  No rebound, no guarding.  Extremities: 1+ leg edema.    Subjective:   Patient notes she is less short of breath.  No wheezing at this time.  She notes that she is not on oxygen at home.  We discussed SNF vs Home with [facility] and she opted for [facility]. I told her we would try to get her off O2, but she may need home O2 upon discharge      Objective:     Visit Vitals  BP 121/78 (BP 1 Location: Right arm, BP Patient Position: Sitting)   Pulse 86   Temp 98.7 ??F (37.1 ??C)   Resp 18   Ht  (1.727 m)   Wt 93.8 kg (206 lb 11.2 oz)   SpO2 99%   BMI 31.43 kg/m??    O2 Flow Rate (L/min): 2 l/min O2 Device: Room air    Temp (24hrs), Avg:98.1 ??F (36.7 ??C), Min:97.5 ??F (36.4 ??C), Max:98.7 ??F (37.1 ??C)    03/25 0701 - 03/25 1900  In: 480 [P.O.:480]  Out: 1550 [Urine:1550]   03/23 1901 - 03/25 0700  In: 2280 [P.O.:2280]  Out: 4900 [Urine:4900]  Data Review:       Recent Days:  No results for input(s): WBC, HGB, HCT, PLT, HGBEXT, HCTEXT, PLTEXT, HGBEXT, HCTEXT, PLTEXT in the last 72 hours.  Recent Labs     09/05/18  0612 09/04/18  0608 09/03/18  0610   NA 136 137 134*   K 4.4 4.4 4.9   CL 100 101 101   CO2 GLU 156* 152* 165*   BUN 29* 30* 35*   CREA 0.8 0.8 0.8   CA 8.6 8.3* 8.7   PHOS 4.1 3.7 2.8   ALB 2.4* 2.6* 2.7*     No results for input(s): PH, PCO2, PO2, HCO3, FIO2 in the last 72 hours.    24 Hour Results:  Recent Results (from the past 24 hour(s))   GLUCOSE, POC    Collection Time: 09/04/18  4:56 PM   Result Value Ref Range    Glucose (POC) 143 (H) 65 - 105 mg/dL   GLUCOSE, POC    Collection Time: 09/04/18  9:12 PM   Result Value Ref Range    Glucose (POC) 133 (H) 65 - 105 mg/dL   RENAL FUNCTION PANEL    Collection Time: 09/05/18  6:12 AM   Result Value Ref Range    Sodium 136 136 - 145 mEq/L    Potassium 4.4 3.5 - 5.1 mEq/L    Chloride 100 98 - 107 mEq/L    CO2 30 21 - 32 mEq/L    Glucose 156 (H) 74 - 106 mg/dl    BUN 29 (H) 7 - 25 mg/dl     Creatinine 0.8 0.6 - 1.3 mg/dl    GFR est AA >16.1      GFR est non-AA >60      Calcium 8.6 8.5 - 10.1 mg/dl    Albumin 2.4 (L) 3.4 - 5.0 gm/dl    Phosphorus 4.1 2.5 - 4.9 mg/dl    Anion gap 6 5 - 15 mmol/L   GLUCOSE, POC    Collection Time: 09/05/18  7:54 AM   Result Value Ref Range    Glucose (POC) 125 (H) 65 - 105 mg/dL   GLUCOSE, POC    Collection Time: 09/05/18 12:01 PM   Result Value Ref Range    Glucose (POC) 67 65 - 105 mg/dL   GLUCOSE, POC    Collection Time: 09/05/18 12:25 PM   Result Value Ref Range    Glucose (POC) 123 (H) 65 - 105 mg/dL       Xr Chest Sngl V    Result Date: 09/04/2018  Clinical history: Chest pressure, chest tube removal EXAMINATION: Single view of the chest 09/04/2018 Correlation: Chest radiograph 09/03/2018 and CT scan 08/30/2018 FINDINGS: Interval removal of left chest tube. Small left apical pneumothorax. Left perihilar opacity corresponds to atelectasis on CT scan. 1.7 cm left lower lobe nodule. Right lung is clear.     IMPRESSION: 1. Removal of right chest tube, small right apical pneumothorax. 2. Left perihilar opacity corresponding to atelectasis. 3. 1.7 cm left lower lobe nodule.     Xr Chest Sngl V    Result Date: 09/03/2018  Exam: AP portable chest Clinical indication: Dyspnea Comparison: September 01, 2018 Results:  No consolidation.  No pleural effusions.    17 mm nodule left mid chest. Bullous disease left upper lobe. Persistent left upper lobe pneumothorax, unchanged. Left mid chest opacity. Unchanged. Heart normal.  Mediastinal contours are normal. Left chest tube unchanged. No free air is seen  under the hemidiaphragms.   Osseous structures intact.     IMPRESSION:   1.  Persistent episode left upper lobe pneumothorax, unchanged. Left lung nodule. Stable bullous disease. 2. Left mid lung opacities likely representing atelectasis/consolidation, unchanged. 3. Left chest tube unchanged.     Xr Chest Sngl V    Result Date: 09/01/2018   INDICATION: Pneumothorax.    COMPARISON: 08/31/2018 TECHNIQUE: Single view chest.     IMPRESSION: There is left lung pulmonary nodule, unchanged. Subtle left 10% apical pneumothorax, relatively unchanged. There is left-sided chest tube. Patchy infiltrates in the right lower lung zone, slightly increased since prior study.     Xr Chest Sngl V    Result Date: 08/31/2018  INDICATION: Pneumothorax   EXAMINATION: XR CHEST SNGL V COMPARISON: 08/30/2018 FINDINGS: The study shows a normal sized heart.. Partially 10 and 15% persistent left pneumothorax. 18 mm left lung nodule. Indwelling left chest tube.Marland Kitchen        IMPRESSION: 1. 18 mm left lung nodule. 2. 15% left pneumothorax. 3. Indwelling left chest tube.     Xr Chest Sngl V    Result Date: 08/30/2018  INDICATION: Pneumothorax   EXAMINATION: XR CHEST SNGL V COMPARISON: 08/29/2018 FINDINGS: The study shows a normal sized heart.. Proximally 10-15% residual left apical pneumothorax.. Indwelling left chest tube. Left lung mass..        IMPRESSION: 1. Similar 10-15% left pneumothorax. 2. Indwelling left chest tube. 3. Left lung mass.     Xr Chest Sngl V    Result Date: 08/29/2018  EXAMINATION: XR CHEST SNGL V CLINICAL INDICATION: Pneumothorax     COMPARISON:  August 28, 2018 TECHNIQUE: AP portable chest FINDINGS: 15 mm nodule left mid chest. Chest tube left mid to lower lung. Tip now directed medially. Marked improvement of the left pneumothorax. Left apical lines medially and a line to the left of the aortic arch suggest residual pneumothorax. Lucency in left mid chest may be due to a superimposed loculated pneumothorax or bulla. Mild right basilar infiltrate. Mild cardiomegaly.     IMPRESSION:  1. Marked reduction in the left-sided pneumothorax. Likely less than 15%-20% 2. Left lung nodule. 3. Left chest tube with tip now directed medially     Xr Chest Sngl V    Result Date: 08/28/2018  Clinical history: Chest tube placement EXAMINATION: Single view of the  chest 08/28/2018 Correlation: CT scan 08/28/2018 FINDINGS: Trachea and heart size are within normal limits. Right lung is clear. There are left lung nodules. Placement of left chest tube. Small left apical lateral pneumothorax.     IMPRESSION: 1. Placement of left chest tube, left apical lateral pneumothorax. 2. Left lung nodules.     Xr Chest Sngl V    Result Date: 08/28/2018  EXAM:  Chest AP INDICATIONS: sob, COMPARISON: 08/22/2018. FINDINGS: New hyperlucency of the left upper lung zone with a triangular opacity in the left midlung zone. Findings are concerning for pneumothorax with associated partial atelectasis/collapse of the upper lobe. Stable nodular density in the left lower lung zone. The right lung is clear. Normal cardiomediastinal contours.     IMPRESSION: New large left pneumothorax. Results discussed with Dr. Laveda Norman on 08/28/2018 at 11:40 AM.     Xr Chest Sngl V    Result Date: 08/22/2018  Clinical history: Shortness of breath EXAMINATION: Single view of the chest 08/22/2018 Correlation: Chest radiograph 08/04/2018 and CTA chest 02/18/2018 FINDINGS: Trachea and heart size are within normal limits. Scarring/atelectasis left mid to lower lung. 2 cm left  lower lobe nodule unchanged from chest radiograph of 08/04/2018 and CTA chest 02/18/2018. Right lung is clear.     IMPRESSION: 1. No acute pulmonary process. 2. 2 cm stable nodule left lower lobe unchanged from 08/04/2018 and evident on CT scan 02/18/2018.     Mri Brain Wo Cont    Result Date: 08/08/2018  EXAMINATION: MRI BRAIN WO CONT CLINICAL INDICATION: intractable HA     COMPARISON:  April 09, 2015 TECHNIQUE: T1 and T2-weighted sequences of the brain were obtained in multiple planes without contrast. FINDINGS: No restricted diffusion. Stable left posterior fossa dural based mass 10 mm, abutting tentorium. Isointense on T1 and T2. Findings typical of meningioma. Stable left frontal scalp subgaleal lipoma. Small right  frontal subgaleal lipoma. No  no midline shift. No hydrocephalus. No extra-axial fluid collections. Visualized portions of the orbits grossly unremarkable. Mastoid air cells well aerated. Visualized portions of paranasal sinuses demonstrate mucosal thickening posterior right ethmoid air cells and right sphenoid sinus. Intracranial flow voids of great vessels, grossly intact. Extensive confluent T2 prolongation in corona radiata and centrum semiovale, more than expected for patient's age. Extensive subcortical T2 prolongation in the frontoparietal lobes. T2 prolongation central pons. Marked frontotemporal lobe atrophy. No susceptibility artifact on gradient echo imaging to suggest hemorrhage. Cerebellar tonsil normal.          IMPRESSION: 1.   Stable left posterior fossa dural based mass. Long-term stability would be consistent with meningioma. 2. No acute infarction. 3. Extensive chronic microvascular ischemic change     Ct Chest Wo Cont    Result Date: 08/30/2018  Indication: Apical pneumothorax versus blebs following chest tube insertion.     IMPRESSION: Large biapical subpleural blebs and bullae. No pneumothorax. 17 mm left lung nodule. Comment: CT images of the chest were obtained without intravenous contrast. This was compared with all recent studies. A 9 French left chest tube is in place. There is no pneumothorax. Subpleural blebs and bullae are present in both lungs particularly in the upper lobes creating the lines simulating left pneumothorax on plain films. A 17 mm left lower lobe nodule. Atelectatic consolidation is present in the left upper lobe. There is minimal subsegmental atelectasis at the right lung base. The heart is of normal size. Aorta is of normal caliber. Focal calcification present in the left breast. These findings were discussed with Javier Glazier, PA.Marland Kitchen DICOM format imaged data is available to non-affiliated external healthcare facilities or entities on a secure,  media free, reciprocally searchable basis with patient authorization  for 12 months following the date of the study.     Ct Guide Insrt Chest Tube    Result Date: 08/28/2018  Indication: Spontaneous left pneumothorax.     IMPRESSION: Using aseptic technique under CT guidance 9 French left chest tube was placed and connected to continuous low pressure suction. The pneumothorax was evacuated. The catheter was secured in place. There were no complications and the patient left the radiology department in satisfactory condition. 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 Fentanyl sedation was utilized by the radiology nurse. Start time 1444 and end time 1504. DICOM format imaged data is available to non-affiliated external healthcare facilities or entities on a secure, media free, reciprocally searchable basis with patient authorization  for 12 months following the date of the study.     Duplex Temporal Art Right Ltd    Result Date: 08/07/2018  Study ID: [id number]                                        [facility]                                           [address] [place],                                         [place]                                          [address]                          Temporal Artery Duplex Report Name: TIDA, GRUENER          Study Date: 08/07/2018 09:54 AM MRN: [id number]                    Patient Location: [id number] DOB: 19-Oct-1950                Age: 68 yrs Gender: Female                 Account #: [id number] Reason For Study: Headaches Ordering Physician: Scarlette Ar Performed By: Eddie North, RVT Interpretation Summary Normal velocities were  dected in all vessels. No sign of halo detected around the parietal, frontal and temporal arteries. _____________________________________________________________________________ __ Quality/Procedure Limited color duplex ultrasound of the temporal arteries was performed. ICD 10: R51. History/symptoms Diabetes. HTN. COPD. Headaches. Right Superficial Temporal Artery Velocities RT: Proximal superficial temporal branch 39.6cm/s. RT: Mid superficial temporal branch 45.3cm/s. RT: Distal superficial temporal branch 40.6cm/s. Right Frontal Branch Velocities RT: Distal frontal branch 21.2cm/s. RT: Mid frontal branch 15.3cm/s. RT: Proximal frontal branch 27.8cm/s. Right Parietal Branch Velocities RT: Proximal parietal branch 38.4cm/s. RT: Mid parietal branch 24.9cm/s. RT: Distal parietal branch 30.3cm/s. Electronically signed  byDr. Neta Ehlers, M.D   08/07/2018 04:43 PM      Microbiology:  All Micro Results     Procedure Component Value Units Date/Time    CULTURE, RESPIRATORY/SPUTUM/BRONCH Gay Filler STAIN [[id number]]  (Abnormal) Collected:  08/24/18 1554    Order Status:  Completed Specimen:  Respiratory specimen from Sputum Updated:  08/27/18 1457     GRAM STAIN       Rare Gram Positive Cocci In Clusters  Rare Gram Positive Bacilli  Few Gram Positive Cocci In Chains  <10 Epithelial cells/lpf  10 - 25 WBC's/lpf  Mucus Present       Culture result       Moderate Commensal Flora Isolated                 Cardiology:  Results for orders placed or performed during the hospital encounter of 08/22/18   EKG, 12 LEAD, INITIAL   Result Value Ref Range    Ventricular Rate 120 BPM    Atrial Rate 120 BPM    P-R Interval 144 ms    QRS Duration 72 ms    Q-T Interval 298 ms    QTC Calculation (Bezet) 421 ms    Calculated P Axis 72 degrees    Calculated R Axis 70 degrees    Calculated T Axis 66 degrees    Diagnosis       Sinus tachycardia  Otherwise normal ECG  When compared with ECG of 04-Aug-2018 23:00,   premature supraventricular complexes are no longer present  Nonspecific T wave abnormality no longer evident in Inferior leads  Nonspecific T wave abnormality no longer evident in Lateral leads  Confirmed by Yetta Barre, M.D., John P ([id number]) on 08/28/2018 4:43:46 PM           Problem List:  Problem List as of 09/05/2018 Date Reviewed: August 22, 2018          Codes Class Noted - Resolved    Respiratory failure with hypoxia (HCC) ICD-10-CM: J96.91  ICD-9-CM: 518.81  08/22/2018 - Present        Headache ICD-10-CM: R51  ICD-9-CM: 784.0  08/05/2018 - Present        Abdominal pain ICD-10-CM: R10.9  ICD-9-CM: 789.00  02/04/2018 - Present        SIRS (systemic inflammatory response syndrome) (HCC) ICD-10-CM: R65.10  ICD-9-CM: 995.90  10/09/2017 - Present        Hyperkalemia ICD-10-CM: E87.5  ICD-9-CM: 276.7  09/08/2017 - Present        Obtunded ICD-10-CM: R40.1  ICD-9-CM: 780.09  09/08/2017 - Present        Acute renal failure (ARF) (HCC) ICD-10-CM: N17.9  ICD-9-CM: 584.9  09/08/2017 - Present        Septic shock (HCC) ICD-10-CM: A41.9, R65.21  ICD-9-CM: 038.9, 785.52, 995.92  09/08/2017 - Present        Metabolic encephalopathy ICD-10-CM: G93.41  ICD-9-CM: 348.31  09/08/2017 - Present        Dehydration ICD-10-CM: E86.0  ICD-9-CM: 276.51  08/07/2017 - Present        COPD with acute exacerbation (HCC) ICD-10-CM: J44.1  ICD-9-CM: 491.21  08/07/2017 - Present        Acute-on-chronic kidney injury (HCC) ICD-10-CM: N17.9, N18.9  ICD-9-CM: 584.9, 585.9  08/07/2017 - Present        Chest pain ICD-10-CM: R07.9  ICD-9-CM: 786.50  06/15/2017 - Present        Dyspnea ICD-10-CM: R06.00  ICD-9-CM: 786.09  06/15/2017 - Present  Type 2 diabetes mellitus with diabetic neuropathy (HCC) ICD-10-CM: E11.40  ICD-9-CM: 250.60, 357.2  12/07/2016 - Present        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  ICD-9-CM: 569.89  08/17/2016 - Present        Cannabinoid hyperemesis syndrome (HCC) ICD-10-CM: F12.988  ICD-9-CM: 536.2, 305.20  08/17/2016 - Present         Gastritis ICD-10-CM: K29.70  ICD-9-CM: 535.50  08/16/2016 - Present        Nausea & vomiting ICD-10-CM: R11.2  ICD-9-CM: 787.01  08/16/2016 - Present        Asthma with acute exacerbation ICD-10-CM: J45.901  ICD-9-CM: 493.92  08/04/2016 - Present        Lactic acidosis ICD-10-CM: E87.2  ICD-9-CM: 276.2  07/24/2016 - Present        Leukocytosis ICD-10-CM: D72.829  ICD-9-CM: 288.60  07/24/2016 - Present        Sepsis (HCC) ICD-10-CM: A41.9  ICD-9-CM: 038.9, 995.91  07/24/2016 - Present        Asthma exacerbation ICD-10-CM: J45.901  ICD-9-CM: 493.92  07/24/2016 - Present        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: E11.21  ICD-9-CM: 250.40, 583.81  06/12/2016 - Present        Sacroiliitis (HCC) ICD-10-CM: M46.1  ICD-9-CM: 720.2  11/25/2015 - Present        Essential hypertension ICD-10-CM: I10  ICD-9-CM: 401.9  10/06/2015 - Present        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  ICD-9-CM: 721.3  09/15/2015 - Present        Lumbar and sacral osteoarthritis ICD-10-CM: M47.817  ICD-9-CM: 721.3  09/15/2015 - Present        Chronic pain syndrome ICD-10-CM: G89.4  ICD-9-CM: 338.4  09/15/2015 - Present        Type 2 diabetes mellitus with hyperglycemia, without long-term current use of insulin (HCC) ICD-10-CM: E11.65  ICD-9-CM: 250.00, 790.29  08/20/2015 - Present        Acute colitis ICD-10-CM: K52.9  ICD-9-CM: 558.9  07/02/2015 - Present        Acute hyperglycemia ICD-10-CM: R73.9  ICD-9-CM: 790.29  07/02/2015 - Present        Accelerated hypertension ICD-10-CM: I10  ICD-9-CM: 401.0  04/08/2015 - Present        Severe headache ICD-10-CM: R51  ICD-9-CM: 784.0  04/07/2015 - Present        Osteoarthritis of hips, 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  ICD-9-CM: 715.95  01/29/2015 - Present        PTSD (post-traumatic stress disorder) (Chronic) ICD-10-CM: F43.10  ICD-9-CM: 309.81  01/29/2015 - Present        Severe hypertension (Chronic) ICD-10-CM: I10  ICD-9-CM: 401.9  07/21/2014 - Present         RESOLVED: COPD exacerbation (HCC) ICD-10-CM: J44.1  ICD-9-CM: 491.21  11/14/2017 - 08/08/2018        RESOLVED: New onset type 1 diabetes mellitus, uncontrolled (HCC) ICD-10-CM: E10.65  ICD-9-CM: 250.03  07/02/2015 - 10/06/2015        RESOLVED: Recurrent major depressive disorder, in full remission (HCC) ICD-10-CM: F33.42  ICD-9-CM: 296.36  12/02/2014 - 12/02/2014        RESOLVED: Foreign body in colon ICD-10-CM: T18.4XXA  ICD-9-CM: 936  08/14/2014 - 01/29/2015        RESOLVED: Advanced care planning/counseling discussion ICD-10-CM: Z61.09  ICD-9-CM: V65.49  07/21/2014 - 01/29/2015    Overview Signed 07/21/2014  3:46 PM by Littie Deeds  Patient given [place] application               RESOLVED: Hip pain, chronic ICD-10-CM: M25.559, G89.29  ICD-9-CM: 719.45, 338.29  07/21/2014 - 01/29/2015              Medications reviewed  Current Facility-Administered Medications   Medication Dose Route Frequency   ??? rosuvastatin (CRESTOR) tablet 5 mg  5 mg Oral QHS   ??? albuterol-ipratropium (DUO-NEB) 2.5 MG-0.5 MG/3 ML  3 mL Nebulization Q6H RT   ??? predniSONE (DELTASONE) tablet 40 mg  40 mg Oral DAILY WITH BREAKFAST   ??? dilTIAZem CD (CARDIZEM CD) capsule 240 mg  240 mg Oral DAILY   ??? losartan (COZAAR) tablet 50 mg  50 mg Oral DAILY   ??? polyethylene glycol (MIRALAX) packet 17 g  17 g Oral BID PRN   ??? bisacodyL (DULCOLAX) suppository 10 mg  10 mg Rectal DAILY PRN   ??? guaiFENesin ER (MUCINEX) tablet 1,200 mg  1,200 mg Oral Q12H   ??? LORazepam (ATIVAN) injection 0.5 mg  0.5 mg IntraVENous Q6H PRN   ??? cetirizine (ZYRTEC) tablet 10 mg  10 mg Oral DAILY   ??? fluticasone propionate (FLONASE) 50 mcg/actuation nasal spray 2 Spray  2 Spray Both Nostrils BID   ??? HYDROcodone-acetaminophen (NORCO) 5-325 mg per tablet 1-2 Tab  1-2 Tab Oral Q4H PRN   ??? HYDROmorphone (DILAUDID) injection 0.5-1 mg  0.5-1 mg IntraVENous Q3H PRN   ??? docusate sodium (COLACE) capsule 100 mg  100 mg Oral BID   ??? hydrOXYzine HCL (ATARAX) tablet 25 mg  25 mg Oral TID PRN    ??? cloNIDine HCL (CATAPRES) tablet 0.3 mg  0.3 mg Oral BID   ??? QUEtiapine SR (SEROquel XR) tablet 150 mg  150 mg Oral QHS   ??? revefenacin (YUPELRI) nebulizer solution 175 mcg  175 mcg Nebulization DAILY   ??? dextromethorphan (DELSYM) 30 mg/5 mL syrup 30 mg  30 mg Oral Q12H PRN   ??? gabapentin (NEURONTIN) capsule 300 mg  300 mg Oral TID   ??? montelukast (SINGULAIR) tablet 10 mg  10 mg Oral QHS   ??? naloxone (NARCAN) injection 0.1 mg  0.1 mg IntraVENous PRN   ??? acetaminophen (TYLENOL) tablet 650 mg  650 mg Oral Q4H PRN   ??? ondansetron (ZOFRAN) injection 4 mg  4 mg IntraVENous Q4H PRN   ??? heparin (porcine) injection 5,000 Units  5,000 Units SubCUTAneous Q8H   ??? arformoteroL (BROVANA) neb solution 15 mcg  15 mcg Nebulization BID RT   ??? budesonide (PULMICORT) 500 mcg/2 ml nebulizer suspension  500 mcg Nebulization BID RT   ??? dextrose (D50W) injection syrg 5-25 g  10-50 mL IntraVENous PRN   ??? glucagon (GLUCAGEN) injection 1 mg  1 mg IntraMUSCular PRN   ??? insulin glargine (LANTUS) injection 1-100 Units  1-100 Units SubCUTAneous QHS   ??? insulin lispro (HUMALOG) injection 1-100 Units  1-100 Units SubCUTAneous AC&HS   ??? insulin lispro (HUMALOG) injection 1-100 Units  1-100 Units SubCUTAneous PRN         [name] Bimler, MD

## 2018-09-05 NOTE — Other (Signed)
Bedside and Verbal shift change report given to Dot Been, Charity fundraiser (Cabin crew) by Clerance Lav, RN (offgoing nurse). Report included the following information SBAR and Kardex.

## 2018-09-05 NOTE — Progress Notes (Signed)
Problem: Gas Exchange - Impaired  Goal: *Absence of hypoxia  Outcome: Progressing Towards Goal

## 2018-09-05 NOTE — Other (Signed)
Bedside and Verbal shift change report given to Lenon Oms (oncoming nurse) by Terance Hart (offgoing nurse). Report included the following information SBAR, Kardex and [facility]

## 2018-09-05 NOTE — Progress Notes (Signed)
Results for BRIYAH, EDMINSTER (MRN [id number]) as of 09/05/2018 13:47   Ref. Range 09/05/2018 12:01 09/05/2018 12:25   GLUCOSE,FAST - POC Latest Ref Range: 65 - 105 mg/dL 67 875 (H)   Pt is asymptomatic, gave lunch tray recheck BG.   Pt eating lunch.   Denies any discomfort or distress.   Charge RN made aware.   Will continue to monitor.

## 2018-09-05 NOTE — Progress Notes (Signed)
Progress  Notes by Alvy BimlerMunoz, Azeez Dunker E, MD at 09/05/18 1616                Author: Alvy BimlerMunoz, Sachit Gilman E, MD  Service: Hospitalist  Author Type: Physician       Filed: 09/05/18 1632  Date of Service: 09/05/18 1616  Status: Signed          Editor: Alvy BimlerMunoz, Denny Lave E, MD (Physician)                                    Hospitalist Progress Note   Alvy BimlerMarc E Freeman Borba, MD   [facility]      Daily Progress Note: 09/05/2018        Assessment/Plan:        Acute hypoxic respiratory failure    Pt was on BiPAP   Pt noted she does not use O2 at home   Will continue to wean O2   We will mobilize the patient and will see if we wean the patient off of O2.   If this is not possible, we will have the pt get a Home O2 evaluation and plan for home O2      Acute L pneumothorax   S/p Chest tube (removed 3/23)   Follow-up pCxR 3/24 with small apical pneumothorax smaller than on 3/23      Acute COPD exacerbation   Seems to be resolved.   Initially on IV steroid, now weaned down to p.o. prednisone that will taper   Continue Brovana nebulizer, Pulmicort nebulizer, Zyrtec, Singulair      HTN   Controlled.     Continue clonidine patch, Cardizem CD 240 mg daily, losartan 50 mg daily      DM2   Glucomander protocol      HLD   Resume Crestor 5 mg daily      Peripheral neuropathy   Continue Neurontin 300 mg TID      PTSD   Patient is on Seroquel 100 mg at bedtime      2 cm LLL nodule   Patient will need follow-up as an outpatient with repeat CT scan      DVT prophylaxis    Heparin SQ      CODE STATUS.  Full code      Disposition.   Will try to wean the patient off of O2 and mobilize the patient.   Pt declined SNF   Plan on d/c home with home health PT/OT/RN and possibly Home O2 likely on by the end of the week         ------------------      36 minutes were spent on the patient of which more than 50% was spent in coordination of care and counseling (time spent with patient/family face to face, physical exam, reviewing laboratory and imaging investigations,  speaking with physicians and nursing  staff involved in this patient's care)      ------------------   Physical exam:   General:   Alert, NAD, pleasant.  Cooperative   HEENT: Sclera anicteric, PERRL, OM moist, throat clear.   Chest:   CTA B, no wheeze, no rales, no rhonchi.  Fair air movement.   CV: RRR, S1/S2 were normal, no murmur   Abdomen: NTND, soft, NABS, no masses were noted.  No rebound, no guarding.   Extremities: 1+ leg  edema.        Subjective:     Patient  notes she is less short of breath.  No wheezing at this time.  She notes that she is not on oxygen at home.  We discussed SNF vs Home with [facility] and she opted for [facility]. I told  her we would try to get her off O2, but she may need home O2 upon discharge           Objective:        Visit Vitals      BP  121/78 (BP 1 Location: Right arm, BP Patient Position: Sitting)     Pulse  86     Temp  98.7 ??F (37.1 ??C)     Resp  18     Ht  5\' 8"  (1.727 m)     Wt  93.8 kg (206 lb 11.2 oz)     SpO2  99%        BMI  31.43 kg/m??      O2 Flow Rate (L/min): 2 l/min  O2 Device: Room air      Temp (24hrs), Avg:98.1 ??F (36.7 ??C), Min:97.5 ??F (36.4 ??C), Max:98.7 ??F (37.1 ??C)     03/25 0701 - 03/25 1900   In: 480 [P.O.:480]   Out: 1550 [Urine:1550]   03/23 1901 - 03/25 0700   In: 2280 [P.O.:2280]   Out: 4900 [Urine:4900]           Data Review:          Recent Days:   No results for input(s): WBC, HGB, HCT, PLT, HGBEXT, HCTEXT, PLTEXT, HGBEXT, HCTEXT, PLTEXT in the last 72 hours.     Recent Labs             09/05/18   0612  09/04/18   0608  09/03/18   0610     NA  136  137  134*     K  4.4  4.4  4.9     CL  100  101  101     CO2  30  27  29      GLU  156*  152*  165*     BUN  29*  30*  35*     CREA  0.8  0.8  0.8     CA  8.6  8.3*  8.7     PHOS  4.1  3.7  2.8          ALB  2.4*  2.6*  2.7*        No results for input(s): PH, PCO2, PO2, HCO3, FIO2 in the last 72 hours.      24 Hour Results:     Recent Results (from the past 24 hour(s))     GLUCOSE, POC          Collection Time:  09/04/18  4:56 PM         Result  Value  Ref Range            Glucose (POC)  143 (H)  65 - 105 mg/dL       GLUCOSE, POC          Collection Time: 09/04/18  9:12 PM         Result  Value  Ref Range            Glucose (POC)  133 (H)  65 - 105 mg/dL       RENAL FUNCTION PANEL          Collection Time: 09/05/18  6:12 AM  Result  Value  Ref Range            Sodium  136  136 - 145 mEq/L       Potassium  4.4  3.5 - 5.1 mEq/L       Chloride  100  98 - 107 mEq/L       CO2  30  21 - 32 mEq/L       Glucose  156 (H)  74 - 106 mg/dl       BUN  29 (H)  7 - 25 mg/dl       Creatinine  0.8  0.6 - 1.3 mg/dl       GFR est AA  >16.1          GFR est non-AA  >60          Calcium  8.6  8.5 - 10.1 mg/dl       Albumin  2.4 (L)  3.4 - 5.0 gm/dl       Phosphorus  4.1  2.5 - 4.9 mg/dl       Anion gap  6  5 - 15 mmol/L       GLUCOSE, POC          Collection Time: 09/05/18  7:54 AM         Result  Value  Ref Range            Glucose (POC)  125 (H)  65 - 105 mg/dL       GLUCOSE, POC          Collection Time: 09/05/18 12:01 PM         Result  Value  Ref Range            Glucose (POC)  67  65 - 105 mg/dL       GLUCOSE, POC          Collection Time: 09/05/18 12:25 PM         Result  Value  Ref Range            Glucose (POC)  123 (H)  65 - 105 mg/dL           Xr Chest Sngl V      Result Date: 09/04/2018   Clinical history: Chest pressure, chest tube removal EXAMINATION: Single view of the chest 09/04/2018 Correlation: Chest radiograph 09/03/2018 and CT scan 08/30/2018 FINDINGS: Interval removal of left chest tube. Small left apical pneumothorax. Left perihilar  opacity corresponds to atelectasis on CT scan. 1.7 cm left lower lobe nodule. Right lung is clear.       IMPRESSION: 1. Removal of right chest tube, small right apical pneumothorax. 2. Left perihilar opacity corresponding to atelectasis. 3. 1.7 cm left lower lobe nodule.       Xr Chest Sngl V      Result Date: 09/03/2018   Exam: AP portable chest Clinical indication: Dyspnea Comparison:  September 01, 2018 Results:  No consolidation.  No pleural effusions.    17 mm nodule left mid chest. Bullous disease left upper lobe. Persistent left upper lobe pneumothorax, unchanged. Left  mid chest opacity. Unchanged. Heart normal.  Mediastinal contours are normal. Left chest tube unchanged. No free air is seen under the hemidiaphragms.   Osseous structures intact.       IMPRESSION:   1.  Persistent episode left upper lobe pneumothorax, unchanged. Left lung nodule. Stable bullous disease. 2. Left mid lung opacities likely representing atelectasis/consolidation, unchanged. 3.  Left chest tube unchanged.       Xr Chest Sngl V      Result Date: 09/01/2018   INDICATION: Pneumothorax.    COMPARISON: 08/31/2018 TECHNIQUE: Single view chest.       IMPRESSION: There is left lung pulmonary nodule, unchanged. Subtle left 10% apical pneumothorax, relatively unchanged. There is left-sided chest tube. Patchy infiltrates in the right lower lung zone, slightly increased since prior study.       Xr Chest Sngl V      Result Date: 08/31/2018   INDICATION: Pneumothorax   EXAMINATION: XR CHEST SNGL V COMPARISON: 08/30/2018 FINDINGS: The study shows a normal sized heart.. Partially 10 and 15% persistent left pneumothorax. 18 mm left lung nodule. Indwelling left chest tube.Marland Kitchen          IMPRESSION: 1. 18 mm left lung nodule. 2. 15% left pneumothorax. 3. Indwelling left chest tube.       Xr Chest Sngl V      Result Date: 08/30/2018   INDICATION: Pneumothorax   EXAMINATION: XR CHEST SNGL V COMPARISON: 08/29/2018 FINDINGS: The study shows a normal sized heart.. Proximally 10-15% residual left apical pneumothorax.. Indwelling left chest tube. Left lung mass..          IMPRESSION: 1. Similar 10-15% left pneumothorax. 2. Indwelling left chest tube. 3. Left lung mass.       Xr Chest Sngl V      Result Date: 08/29/2018   EXAMINATION: XR CHEST SNGL V CLINICAL INDICATION: Pneumothorax     COMPARISON:  August 28, 2018 TECHNIQUE: AP portable chest FINDINGS: 15  mm nodule left mid chest. Chest tube left mid to lower lung. Tip now directed medially. Marked improvement of the  left pneumothorax. Left apical lines medially and a line to the left of the aortic arch suggest residual pneumothorax. Lucency in left mid chest may be due to a superimposed loculated pneumothorax or bulla. Mild right basilar infiltrate. Mild cardiomegaly.       IMPRESSION:  1. Marked reduction in the left-sided pneumothorax. Likely less than 15%-20% 2. Left lung nodule. 3. Left chest tube with tip now directed medially       Xr Chest Sngl V      Result Date: 08/28/2018   Clinical history: Chest tube placement EXAMINATION: Single view of the chest 08/28/2018 Correlation: CT scan 08/28/2018 FINDINGS: Trachea and heart size are within normal limits. Right lung is clear. There are left lung nodules. Placement of left chest  tube. Small left apical lateral pneumothorax.       IMPRESSION: 1. Placement of left chest tube, left apical lateral pneumothorax. 2. Left lung nodules.       Xr Chest Sngl V      Result Date: 08/28/2018   EXAM:  Chest AP INDICATIONS: sob, COMPARISON: 08/22/2018. FINDINGS: New hyperlucency of the left upper lung zone with a triangular opacity in the left midlung zone. Findings are concerning for pneumothorax with associated partial atelectasis/collapse of  the upper lobe. Stable nodular density in the left lower lung zone. The right lung is clear. Normal cardiomediastinal contours.       IMPRESSION: New large left pneumothorax. Results discussed with Dr. Laveda Norman on 08/28/2018 at 11:40 AM.       Xr Chest Sngl V      Result Date: 08/22/2018   Clinical history: Shortness of breath EXAMINATION: Single view of the chest 08/22/2018 Correlation: Chest radiograph 08/04/2018 and CTA chest 02/18/2018 FINDINGS: Trachea and heart size are within normal limits.  Scarring/atelectasis left mid to lower lung.  2 cm left lower lobe nodule unchanged from chest radiograph of 08/04/2018 and CTA chest 02/18/2018. Right lung  is clear.       IMPRESSION: 1. No acute pulmonary process. 2. 2 cm stable nodule left lower lobe unchanged from 08/04/2018 and evident on CT scan 02/18/2018.       Mri Brain Wo Cont      Result Date: 08/08/2018   EXAMINATION: MRI BRAIN WO CONT CLINICAL INDICATION: intractable HA     COMPARISON:  April 09, 2015 TECHNIQUE: T1 and T2-weighted sequences of the brain were obtained in multiple planes without contrast. FINDINGS: No restricted diffusion. Stable left  posterior fossa dural based mass 10 mm, abutting tentorium. Isointense on T1 and T2. Findings typical of meningioma. Stable left frontal scalp subgaleal lipoma. Small right frontal subgaleal lipoma. No  no midline shift. No hydrocephalus. No extra-axial  fluid collections. Visualized portions of the orbits grossly unremarkable. Mastoid air cells well aerated. Visualized portions of paranasal sinuses demonstrate mucosal thickening posterior right ethmoid air cells and right sphenoid sinus. Intracranial  flow voids of great vessels, grossly intact. Extensive confluent T2 prolongation in corona radiata and centrum semiovale, more than expected for patient's age. Extensive subcortical T2 prolongation in the frontoparietal lobes. T2 prolongation central  pons. Marked frontotemporal lobe atrophy. No susceptibility artifact on gradient echo imaging to suggest hemorrhage. Cerebellar tonsil normal.            IMPRESSION: 1.   Stable left posterior fossa dural based mass. Long-term stability would be consistent with meningioma. 2. No acute infarction. 3. Extensive chronic microvascular ischemic change       Ct Chest Wo Cont      Result Date: 08/30/2018   Indication: Apical pneumothorax versus blebs following chest tube insertion.       IMPRESSION: Large biapical subpleural blebs and bullae. No pneumothorax. 17 mm left lung nodule. Comment: CT images of the chest were obtained without intravenous contrast. This was compared with all recent studies. A 9 French left chest tube  is in place.  There is no pneumothorax. Subpleural blebs and bullae are present in both lungs particularly in the upper lobes creating the lines simulating left pneumothorax on plain films. A 17 mm left lower lobe nodule. Atelectatic consolidation is present in the  left upper lobe. There is minimal subsegmental atelectasis at the right lung base. The heart is of normal size. Aorta is of normal caliber. Focal calcification present in the left breast. These findings were discussed with Javier Glazier, PA.Marland Kitchen DICOM format  imaged data is available to non-affiliated external healthcare facilities or entities on a secure, media free, reciprocally searchable basis with patient authorization  for 12 months following the date of the study.       Ct Guide Insrt Chest Tube      Result Date: 08/28/2018   Indication: Spontaneous left pneumothorax.       IMPRESSION: Using aseptic technique under CT guidance 9 French left chest tube was placed and connected to continuous low pressure suction. The pneumothorax was evacuated. The catheter was secured in place. There were no complications and the patient  left the radiology department in satisfactory condition. 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 Fentanyl sedation was utilized by the radiology nurse. Start time 1444 and end time 1504. DICOM format imaged data is available to non-affiliated external  healthcare facilities or entities on a secure, media free, reciprocally searchable basis with patient authorization  for  12 months following the date of the study.       Duplex Temporal Art Right Ltd      Result Date: 08/07/2018                                                                 Study ID: [id number]                                        [facility]                                            [address] [place],                                         [place]                                           [address]                          Temporal Artery Duplex Report Name: ADAYA, GARMANY          Study Date: 08/07/2018 09:54 AM MRN: [id number]                    Patient Location: [id number] DOB: 1951/05/30                Age: 68 yrs Gender:  Female                 Account #: [id number] Reason For Study: Headaches Ordering Physician: Scarlette Ar Performed By: Eddie North, RVT Interpretation Summary Normal velocities were dected in all vessels. No sign  of halo detected around the  parietal, frontal and temporal arteries. _____________________________________________________________________________ __ Quality/Procedure Limited color duplex ultrasound of the temporal arteries was performed. ICD 10: R51. History/symptoms Diabetes.  HTN. COPD. Headaches. Right Superficial Temporal Artery Velocities RT: Proximal superficial temporal branch 39.6cm/s. RT: Mid superficial temporal branch 45.3cm/s. RT: Distal superficial temporal branch 40.6cm/s. Right Frontal Branch Velocities RT: Distal  frontal branch 21.2cm/s. RT: Mid frontal branch 15.3cm/s. RT: Proximal frontal branch 27.8cm/s. Right Parietal Branch Velocities RT: Proximal parietal branch 38.4cm/s. RT: Mid parietal branch 24.9cm/s. RT: Distal parietal branch 30.3cm/s. Electronically  signed byDr. Neta Ehlers, M.D   08/07/2018 04:43 PM         Microbiology:     All Micro Results               Procedure  Component  Value  Units  Date/Time           CULTURE, RESPIRATORY/SPUTUM/BRONCH Berniece Andreas [[id number]]  (Abnormal)  Collected:  08/24/18 1554            Order Status:  Completed  Specimen:  Respiratory specimen from Sputum  Updated:  08/27/18 1457               GRAM STAIN                 Rare Gram Positive Cocci In Clusters   Rare Gram Positive Bacilli   Few Gram Positive Cocci In Chains   <10 Epithelial  cells/lpf   10 - 25 WBC's/lpf   Mucus Present                    Culture result                 Moderate Commensal Flora Isolated                               Cardiology:     Results for orders placed or performed during the hospital encounter of 08/22/18     EKG, 12 LEAD, INITIAL         Result  Value  Ref Range            Ventricular Rate  120  BPM       Atrial Rate  120  BPM       P-R Interval  144  ms       QRS Duration  72  ms       Q-T Interval  298  ms       QTC Calculation (Bezet)  421  ms       Calculated P Axis  72  degrees       Calculated R Axis  70  degrees       Calculated T Axis  66  degrees       Diagnosis                 Sinus tachycardia   Otherwise normal ECG   When compared with ECG of 04-Aug-2018 23:00,   premature supraventricular complexes are no longer present   Nonspecific T wave abnormality no longer evident in Inferior leads   Nonspecific T wave abnormality no longer evident in Lateral leads   Confirmed by Yetta Barre, M.D., John P ([id number]) on 08/28/2018 4:43:46 PM                 Problem List:      Problem  List  as of 09/05/2018  Date Reviewed:  08/25/2018                        Codes  Class  Noted - Resolved             Respiratory failure with hypoxia [facility])  ICD-10-CM: J96.91   ICD-9-CM: 518.81    08/22/2018 - Present                       Headache  ICD-10-CM: R51   ICD-9-CM: 784.0    08/05/2018 - Present                       Abdominal pain  ICD-10-CM: R10.9   ICD-9-CM: 789.00    02/04/2018 - Present                       SIRS (systemic inflammatory response syndrome) (HCC)  ICD-10-CM: R65.10   ICD-9-CM: 995.90    10/09/2017 - Present                       Hyperkalemia  ICD-10-CM: E87.5   ICD-9-CM: 276.7    09/08/2017 - Present                       Obtunded  ICD-10-CM: R40.1   ICD-9-CM: 780.09    09/08/2017 - Present                       Acute renal failure (ARF) (HCC)  ICD-10-CM: N17.9   ICD-9-CM: 584.9    09/08/2017 - Present                       Septic shock (HCC)  ICD-10-CM: A41.9, R65.21    ICD-9-CM: 038.9, 785.52, 995.92    09/08/2017 - Present                       Metabolic encephalopathy  ICD-10-CM: G93.41   ICD-9-CM: 348.31    09/08/2017 - Present                       Dehydration  ICD-10-CM: E86.0   ICD-9-CM: 276.51    08/07/2017 - Present                       COPD with acute exacerbation (HCC)  ICD-10-CM: J44.1   ICD-9-CM: 491.21    08/07/2017 - Present                       Acute-on-chronic kidney injury (HCC)  ICD-10-CM: N17.9, N18.9   ICD-9-CM: 584.9, 585.9    08/07/2017 - Present                       Chest pain  ICD-10-CM: R07.9   ICD-9-CM: 786.50    06/15/2017 - Present                       Dyspnea  ICD-10-CM: R06.00   ICD-9-CM: 786.09    06/15/2017 - Present                       Type 2 diabetes mellitus with diabetic neuropathy (HCC)  ICD-10-CM: E11.40  ICD-9-CM: 250.60, 357.2    12/07/2016 - Present                       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   ICD-9-CM: 569.89    08/17/2016 - Present                       Cannabinoid hyperemesis syndrome (HCC)  ICD-10-CM: F12.988   ICD-9-CM: 536.2, 305.20    08/17/2016 - Present                       Gastritis  ICD-10-CM: K29.70   ICD-9-CM: 535.50    08/16/2016 - Present                       Nausea & vomiting  ICD-10-CM: R11.2   ICD-9-CM: 787.01    08/16/2016 - Present                       Asthma with acute exacerbation  ICD-10-CM: J45.901   ICD-9-CM: 493.92    08/04/2016 - Present                       Lactic acidosis  ICD-10-CM: E87.2   ICD-9-CM: 276.2    07/24/2016 - Present                       Leukocytosis  ICD-10-CM: D72.829   ICD-9-CM: 288.60    07/24/2016 - Present                       Sepsis (HCC)  ICD-10-CM: A41.9   ICD-9-CM: 038.9, 995.91    07/24/2016 - Present                       Asthma exacerbation  ICD-10-CM: J45.901   ICD-9-CM: 493.92    07/24/2016 - Present                       Type 2 diabetes mellitus with nephropathy (HCC)  ICD-10-CM: E11.21   ICD-9-CM: 250.40, 583.81    06/12/2016 - Present                       Sacroiliitis  (HCC)  ICD-10-CM: M46.1   ICD-9-CM: 720.2    11/25/2015 - Present                       Essential hypertension  ICD-10-CM: I10   ICD-9-CM: 401.9    10/06/2015 - Present                       Spondylosis of lumbar region without myelopathy or radiculopathy  ICD-10-CM: M47.816   ICD-9-CM: 721.3    09/15/2015 - Present                       Lumbar and sacral osteoarthritis  ICD-10-CM: M47.817   ICD-9-CM: 721.3    09/15/2015 - Present                       Chronic pain syndrome  ICD-10-CM: G89.4   ICD-9-CM: 338.4    09/15/2015 - Present  Type 2 diabetes mellitus with hyperglycemia, without long-term current use of insulin (HCC)  ICD-10-CM: E11.65   ICD-9-CM: 250.00, 790.29    08/20/2015 - Present                       Acute colitis  ICD-10-CM: K52.9   ICD-9-CM: 558.9    07/02/2015 - Present                       Acute hyperglycemia  ICD-10-CM: R73.9   ICD-9-CM: 790.29    07/02/2015 - Present                       Accelerated hypertension  ICD-10-CM: I10   ICD-9-CM: 401.0    04/08/2015 - Present                       Severe headache  ICD-10-CM: R51   ICD-9-CM: 784.0    04/07/2015 - Present                       Osteoarthritis of hips, bilateral  ICD-10-CM: M16.0   ICD-9-CM: 715.95    01/29/2015 - Present                       PTSD (post-traumatic stress disorder) (Chronic)  ICD-10-CM: F43.10   ICD-9-CM: 309.81    01/29/2015 - Present                       Severe hypertension (Chronic)  ICD-10-CM: I10   ICD-9-CM: 401.9    07/21/2014 - Present                       RESOLVED: COPD exacerbation (HCC)  ICD-10-CM: J44.1   ICD-9-CM: 491.21    11/14/2017 - 08/08/2018                       RESOLVED: New onset type 1 diabetes mellitus, uncontrolled (HCC)  ICD-10-CM: E10.65   ICD-9-CM: 250.03    07/02/2015 - 10/06/2015                       RESOLVED: Recurrent major depressive disorder, in full remission (HCC)  ICD-10-CM: F33.42   ICD-9-CM: 296.36    12/02/2014 - 12/02/2014                       RESOLVED: Foreign body in colon   ICD-10-CM: T18.4XXA   ICD-9-CM: 936    08/14/2014 - 01/29/2015                       RESOLVED: Advanced care planning/counseling discussion  ICD-10-CM: Z71.89   ICD-9-CM: V65.49    07/21/2014 - 01/29/2015          Overview Signed 07/21/2014  3:46 PM by Littie Deeds            Patient given [place] application                                      RESOLVED: Hip pain, chronic  ICD-10-CM: M25.559, G89.29   ICD-9-CM: 719.45, 338.29    07/21/2014 - 01/29/2015  Medications reviewed     Current Facility-Administered Medications          Medication  Dose  Route  Frequency           ?  rosuvastatin (CRESTOR) tablet 5 mg   5 mg  Oral  QHS     ?  albuterol-ipratropium (DUO-NEB) 2.5 MG-0.5 MG/3 ML   3 mL  Nebulization  Q6H RT     ?  predniSONE (DELTASONE) tablet 40 mg   40 mg  Oral  DAILY WITH BREAKFAST     ?  dilTIAZem CD (CARDIZEM CD) capsule 240 mg   240 mg  Oral  DAILY     ?  losartan (COZAAR) tablet 50 mg   50 mg  Oral  DAILY     ?  polyethylene glycol (MIRALAX) packet 17 g   17 g  Oral  BID PRN     ?  bisacodyL (DULCOLAX) suppository 10 mg   10 mg  Rectal  DAILY PRN     ?  guaiFENesin ER (MUCINEX) tablet 1,200 mg   1,200 mg  Oral  Q12H     ?  LORazepam (ATIVAN) injection 0.5 mg   0.5 mg  IntraVENous  Q6H PRN     ?  cetirizine (ZYRTEC) tablet 10 mg   10 mg  Oral  DAILY     ?  fluticasone propionate (FLONASE) 50 mcg/actuation nasal spray 2 Spray   2 Spray  Both Nostrils  BID     ?  HYDROcodone-acetaminophen (NORCO) 5-325 mg per tablet 1-2 Tab   1-2 Tab  Oral  Q4H PRN     ?  HYDROmorphone (DILAUDID) injection 0.5-1 mg   0.5-1 mg  IntraVENous  Q3H PRN     ?  docusate sodium (COLACE) capsule 100 mg   100 mg  Oral  BID     ?  hydrOXYzine HCL (ATARAX) tablet 25 mg   25 mg  Oral  TID PRN     ?  cloNIDine HCL (CATAPRES) tablet 0.3 mg   0.3 mg  Oral  BID     ?  QUEtiapine SR (SEROquel XR) tablet 150 mg   150 mg  Oral  QHS     ?  revefenacin (YUPELRI) nebulizer solution 175 mcg   175 mcg  Nebulization   DAILY     ?  dextromethorphan (DELSYM) 30 mg/5 mL syrup 30 mg   30 mg  Oral  Q12H PRN     ?  gabapentin (NEURONTIN) capsule 300 mg   300 mg  Oral  TID     ?  montelukast (SINGULAIR) tablet 10 mg   10 mg  Oral  QHS     ?  naloxone (NARCAN) injection 0.1 mg   0.1 mg  IntraVENous  PRN     ?  acetaminophen (TYLENOL) tablet 650 mg   650 mg  Oral  Q4H PRN     ?  ondansetron (ZOFRAN) injection 4 mg   4 mg  IntraVENous  Q4H PRN     ?  heparin (porcine) injection 5,000 Units   5,000 Units  SubCUTAneous  Q8H     ?  arformoteroL (BROVANA) neb solution 15 mcg   15 mcg  Nebulization  BID RT           ?  budesonide (PULMICORT) 500 mcg/2 ml nebulizer suspension   500 mcg  Nebulization  BID RT           ?  dextrose (D50W) injection syrg 5-25 g   10-50 mL  IntraVENous  PRN     ?  glucagon (GLUCAGEN) injection 1 mg   1 mg  IntraMUSCular  PRN     ?  insulin glargine (LANTUS) injection 1-100 Units   1-100 Units  SubCUTAneous  QHS     ?  insulin lispro (HUMALOG) injection 1-100 Units   1-100 Units  SubCUTAneous  AC&HS           ?  insulin lispro (HUMALOG) injection 1-100 Units   1-100 Units  SubCUTAneous  PRN              [name], MD

## 2018-09-05 NOTE — Progress Notes (Signed)
Pt refuses to take norco tablet, pt prefers to take dilaudid IV instead of norco tablet.   This RN explained why it is better to start taking po pain medicine.   Pt stated that she prefers to take IV while she is in the hospital and she will deal with po when she d/c.
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
## 2018-09-05 NOTE — Progress Notes (Signed)
Problem: Gas Exchange - Impaired  Goal: *Absence of hypoxia  Outcome: Progressing Towards Goal     Problem: Patient Education: Go to Patient Education Activity  Goal: Patient/Family Education  Outcome: Progressing Towards Goal     Problem: Gas Exchange - Impaired  Goal: *Absence of hypoxia  Outcome: Progressing Towards Goal     Problem: Falls - Risk of  Goal: *Absence of Falls  Description: Document Schmid Fall Risk and appropriate interventions in the flowsheet.  Outcome: Progressing Towards Goal  Note: Fall Risk Interventions:  Mobility Interventions: Patient to call before getting OOB         Medication Interventions: Patient to call before getting OOB, Bed/chair exit alarm, Teach patient to arise slowly    Elimination Interventions: Bed/chair exit alarm, Call light in reach, Patient to call for help with toileting needs              Problem: Pressure Injury - Risk of  Goal: *Prevention of pressure injury  Description: Document Braden Scale and appropriate interventions in the flowsheet.  Outcome: Progressing Towards Goal  Note: Pressure Injury Interventions:       Moisture Interventions: Maintain skin hydration (lotion/cream), Moisture barrier, Apply protective barrier, creams and emollients    Activity Interventions: Pressure redistribution bed/mattress(bed type)    Mobility Interventions: Pressure redistribution bed/mattress (bed type), HOB 30 degrees or less, Float heels    Nutrition Interventions: Document food/fluid/supplement intake, Offer support with meals,snacks and hydration    Friction and Shear Interventions: Apply protective barrier, creams and emollients, Foam dressings/transparent film/skin sealants                Problem: Patient Education: Go to Patient Education Activity  Goal: Patient/Family Education  Outcome: Progressing Towards Goal     Problem: Airway Clearance - Ineffective  Goal: *Patent airway  Outcome: Progressing Towards Goal  Goal: *Absence of airway secretions  Outcome:  Progressing Towards Goal

## 2018-09-06 LAB — RENAL FUNCTION PANEL
Albumin: 2.6 gm/dl — ABNORMAL LOW (ref 3.4–5.0)
Albumin: 2.6 gm/dl — ABNORMAL LOW (ref 3.4–5.0)
Anion Gap: 9 mmol/L (ref 5–15)
Anion gap: 9 mmol/L (ref 5–15)
BUN: 31 mg/dl — ABNORMAL HIGH (ref 7–25)
BUN: 31 mg/dl — ABNORMAL HIGH (ref 7–25)
CO2: 27 mEq/L (ref 21–32)
CO2: 27 mEq/L (ref 21–32)
Calcium: 8.6 mg/dl (ref 8.5–10.1)
Calcium: 8.6 mg/dl (ref 8.5–10.1)
Chloride: 103 mEq/L (ref 98–107)
Chloride: 103 mEq/L (ref 98–107)
Creatinine: 0.8 mg/dl (ref 0.6–1.3)
Creatinine: 0.8 mg/dl (ref 0.6–1.3)
EGFR IF NonAfrican American: >60
GFR African American: >60
GFR est AA: >60
GFR est non-AA: >60
Glucose: 139 mg/dl — ABNORMAL HIGH (ref 74–106)
Glucose: 139 mg/dl — ABNORMAL HIGH (ref 74–106)
Phosphorus: 3.7 mg/dl (ref 2.5–4.9)
Phosphorus: 3.7 mg/dl (ref 2.5–4.9)
Potassium: 5.1 mEq/L (ref 3.5–5.1)
Potassium: 5.1 mEq/L (ref 3.5–5.1)
Sodium: 138 mEq/L (ref 136–145)
Sodium: 138 mEq/L (ref 136–145)

## 2018-09-06 LAB — POCT GLUCOSE
POC Glucose: 206 mg/dL — ABNORMAL HIGH (ref 65–105)
POC Glucose: 197 mg/dL — ABNORMAL HIGH (ref 65–105)
POC Glucose: 128 mg/dL — ABNORMAL HIGH (ref 65–105)
POC Glucose: 147 mg/dL — ABNORMAL HIGH (ref 65–105)

## 2018-09-06 LAB — GLUCOSE, POC
Glucose (POC): 206 mg/dL — ABNORMAL HIGH (ref 65–105)
Glucose (POC): 197 mg/dL — ABNORMAL HIGH (ref 65–105)
Glucose (POC): 128 mg/dL — ABNORMAL HIGH (ref 65–105)
Glucose (POC): 147 mg/dL — ABNORMAL HIGH (ref 65–105)

## 2018-09-06 MED ORDER — GUAIFENESIN 600 MG TABLET,EXTENDED RELEASE BIPHASIC 12 HR
600 mg | Freq: Two times a day (BID) | ORAL | Status: DC
Start: 2018-09-06 — End: 2018-09-07
  Administered 2018-09-07 (×2): via ORAL

## 2018-09-06 MED ORDER — PREDNISONE 10 MG TAB
10 mg | Freq: Every day | ORAL | Status: DC
Start: 2018-09-06 — End: 2018-09-07
  Administered 2018-09-07: 13:00:00 via ORAL

## 2018-09-06 MED ORDER — HYDROCODONE-ACETAMINOPHEN 5 MG-325 MG TAB
5-325 mg | ORAL | Status: DC | PRN
Start: 2018-09-06 — End: 2018-09-07
  Administered 2018-09-06 – 2018-09-07 (×5): via ORAL

## 2018-09-06 MED FILL — GABAPENTIN 300 MG CAP: 300 mg | ORAL | Qty: 1

## 2018-09-06 MED FILL — HEPARIN (PORCINE) 5,000 UNIT/ML IJ SOLN: 5000 unit/mL | INTRAMUSCULAR | Qty: 1

## 2018-09-06 MED FILL — IPRATROPIUM-ALBUTEROL 2.5 MG-0.5 MG/3 ML NEB SOLUTION: 2.5 mg-0.5 mg/3 ml | RESPIRATORY_TRACT | Qty: 3

## 2018-09-06 MED FILL — CLONIDINE 0.1 MG TAB: 0.1 mg | ORAL | Qty: 3

## 2018-09-06 MED FILL — BUDESONIDE 0.5 MG/2 ML NEB SUSPENSION: 0.5 mg/2 mL | RESPIRATORY_TRACT | Qty: 1

## 2018-09-06 MED FILL — HYDROMORPHONE 1 MG/ML INJECTION SOLUTION: 1 mg/mL | INTRAMUSCULAR | Qty: 1

## 2018-09-06 MED FILL — MONTELUKAST 10 MG TAB: 10 mg | ORAL | Qty: 1

## 2018-09-06 MED FILL — CETIRIZINE 5 MG TAB: 5 mg | ORAL | Qty: 2

## 2018-09-06 MED FILL — HYDROCODONE-ACETAMINOPHEN 5 MG-325 MG TAB: 5-325 mg | ORAL | Qty: 1

## 2018-09-06 MED FILL — DILTIAZEM ER 120 MG 24 HR CAP: 120 mg | ORAL | Qty: 2

## 2018-09-06 MED FILL — DOCUSATE SODIUM 100 MG CAP: 100 mg | ORAL | Qty: 1

## 2018-09-06 MED FILL — MUCINEX 600 MG TABLET, EXTENDED RELEASE: 600 mg | ORAL | Qty: 2

## 2018-09-06 MED FILL — BROVANA 15 MCG/2 ML SOLUTION FOR NEBULIZATION: 15 mcg/2 mL | RESPIRATORY_TRACT | Qty: 1

## 2018-09-06 MED FILL — YUPELRI 175 MCG/3 ML SOLUTION FOR NEBULIZATION: 175 mcg/3 mL | RESPIRATORY_TRACT | Qty: 1

## 2018-09-06 MED FILL — QUETIAPINE SR 50 MG 24 HR TAB: 50 mg | ORAL | Qty: 3

## 2018-09-06 MED FILL — PREDNISONE 20 MG TAB: 20 mg | ORAL | Qty: 2

## 2018-09-06 MED FILL — ROSUVASTATIN 5 MG TAB: 5 mg | ORAL | Qty: 1

## 2018-09-06 MED FILL — LOSARTAN 50 MG TAB: 50 mg | ORAL | Qty: 1

## 2018-09-06 NOTE — Progress Notes (Signed)
Hospitalist Progress Note  [name] Bimler, MD  [facility] Hospitalists    Daily Progress Note: 09/06/2018    Assessment/Plan:     Acute hypoxic respiratory failure   Pt was on BiPAP  Pt noted she does not use O2 at home  O2 weaned off 3/26  Mobilize pt.    Acute L pneumothorax  S/p Chest tube (removed 3/23)  Follow-up pCxR 3/24 with small apical pneumothorax smaller than on 3/23  D/C Dilaudid  Norco PRN for pain at the entry site    Acute COPD exacerbation  Seems to be resolved.  Initially on IV steroid, now weaned down to p.o. prednisone that will taper  Continue Brovana nebulizer, Pulmicort nebulizer, Zyrtec, Singulair    HTN  Controlled.    Continue clonidine patch, Cardizem CD 240 mg daily, losartan 50 mg daily    DM2  Glucomander protocol    HLD  Resume Crestor 5 mg daily    Peripheral neuropathy  Continue Neurontin 300 mg TID    PTSD  Patient is on Seroquel 100 mg at bedtime    2 cm LLL nodule  Patient will need follow-up as an outpatient with repeat CT scan    DVT prophylaxis   Heparin SQ while inpt    CODE STATUS.  Full code    Disposition.  Pt seems to have weaned off O2.   Pt initially said she did not want to go to SNF earlier in the week, but now noted that she is amenable to SNF  Case d/w the care manager to plan of SNF placement once insurance auth obtained    ------------------    36 minutes were spent on the patient of which more than 50% was spent in coordination of care and counseling (time spent with patient/family face to face, physical exam, reviewing laboratory and imaging investigations, speaking with physicians and nursing staff involved in this patient's care)    ------------------  Physical exam:  General:  Alert, NAD, pleasant.  Cooperative  HEENT: Sclera anicteric, PERRL, OM moist, throat clear.  Chest:  CTA B, no wheeze, no rales, no rhonchi.  Fair air movement.  CV: RRR, S1/S2 were normal, no murmur  Abdomen: NTND, soft, NABS, no masses were noted.  No rebound, no guarding.   Extremities: 1+ leg edema.    Subjective:     Patient notes she is less short of breath.  No wheezing at this time.  She notes that she is not on oxygen at home.  We discussed SNF and she now says she wants to go to SNF. I told her that now that she is off O2 she will need to sit in the chair and mobilize.       Objective:     Visit Vitals  BP 118/72 (BP Patient Position: Supine)   Pulse 99   Temp 98.2 ??F (36.8 ??C)   Resp 20   Ht 5\' 8"  (1.727 m)   Wt 92.9 kg (204 lb 12.9 oz)   SpO2 95%   BMI 31.14 kg/m??    O2 Flow Rate (L/min): 2 l/min O2 Device: Room air    Temp (24hrs), Avg:98.4 ??F (36.9 ??C), Min:98 ??F (36.7 ??C), Max:98.9 ??F (37.2 ??C)    No intake/output data recorded.   03/24 1901 - 03/26 0700  In: 2040 [P.O.:2040]  Out: 5050 [Urine:5050]      Data Review:       Recent Days:  No results for input(s): WBC, HGB, HCT, PLT, HGBEXT, HCTEXT,  PLTEXT, HGBEXT, HCTEXT, PLTEXT in the last 72 hours.  Recent Labs     09/06/18  0623 09/05/18  0612 09/04/18  0608   NA 138 136 137   K 5.1 4.4 4.4   CL 103 100 101   CO2 27 30 27    GLU 139* 156* 152*   BUN 31* 29* 30*   CREA 0.8 0.8 0.8   CA 8.6 8.6 8.3*   PHOS 3.7 4.1 3.7   ALB 2.6* 2.4* 2.6*     No results for input(s): PH, PCO2, PO2, HCO3, FIO2 in the last 72 hours.    24 Hour Results:  Recent Results (from the past 24 hour(s))   GLUCOSE, POC    Collection Time: 09/05/18 12:01 PM   Result Value Ref Range    Glucose (POC) 67 65 - 105 mg/dL   GLUCOSE, POC    Collection Time: 09/05/18 12:25 PM   Result Value Ref Range    Glucose (POC) 123 (H) 65 - 105 mg/dL   GLUCOSE, POC    Collection Time: 09/05/18  4:45 PM   Result Value Ref Range    Glucose (POC) 262 (H) 65 - 105 mg/dL   GLUCOSE, POC    Collection Time: 09/05/18  9:14 PM   Result Value Ref Range    Glucose (POC) 206 (H) 65 - 105 mg/dL   RENAL FUNCTION PANEL    Collection Time: 09/06/18  6:23 AM   Result Value Ref Range    Sodium 138 136 - 145 mEq/L    Potassium 5.1 3.5 - 5.1 mEq/L    Chloride 103 98 - 107 mEq/L     CO2 27 21 - 32 mEq/L    Glucose 139 (H) 74 - 106 mg/dl    BUN 31 (H) 7 - 25 mg/dl    Creatinine 0.8 0.6 - 1.3 mg/dl    GFR est AA >14.7      GFR est non-AA >60      Calcium 8.6 8.5 - 10.1 mg/dl    Albumin 2.6 (L) 3.4 - 5.0 gm/dl    Phosphorus 3.7 2.5 - 4.9 mg/dl    Anion gap 9 5 - 15 mmol/L   GLUCOSE, POC    Collection Time: 09/06/18  8:51 AM   Result Value Ref Range    Glucose (POC) 197 (H) 65 - 105 mg/dL       Xr Chest Sngl V    Result Date: 09/04/2018  Clinical history: Chest pressure, chest tube removal EXAMINATION: Single view of the chest 09/04/2018 Correlation: Chest radiograph 09/03/2018 and CT scan 08/30/2018 FINDINGS: Interval removal of left chest tube. Small left apical pneumothorax. Left perihilar opacity corresponds to atelectasis on CT scan. 1.7 cm left lower lobe nodule. Right lung is clear.     IMPRESSION: 1. Removal of right chest tube, small right apical pneumothorax. 2. Left perihilar opacity corresponding to atelectasis. 3. 1.7 cm left lower lobe nodule.     Xr Chest Sngl V    Result Date: 09/03/2018  Exam: AP portable chest Clinical indication: Dyspnea Comparison: September 01, 2018 Results:  No consolidation.  No pleural effusions.    17 mm nodule left mid chest. Bullous disease left upper lobe. Persistent left upper lobe pneumothorax, unchanged. Left mid chest opacity. Unchanged. Heart normal.  Mediastinal contours are normal. Left chest tube unchanged. No free air is seen under the hemidiaphragms.   Osseous structures intact.     IMPRESSION:   1.  Persistent episode left upper  lobe pneumothorax, unchanged. Left lung nodule. Stable bullous disease. 2. Left mid lung opacities likely representing atelectasis/consolidation, unchanged. 3. Left chest tube unchanged.     Xr Chest Sngl V    Result Date: 09/01/2018  INDICATION: Pneumothorax.    COMPARISON: 08/31/2018 TECHNIQUE: Single view chest.     IMPRESSION: There is left lung pulmonary nodule, unchanged. Subtle left  10% apical pneumothorax, relatively unchanged. There is left-sided chest tube. Patchy infiltrates in the right lower lung zone, slightly increased since prior study.     Xr Chest Sngl V    Result Date: 08/31/2018  INDICATION: Pneumothorax   EXAMINATION: XR CHEST SNGL V COMPARISON: 08/30/2018 FINDINGS: The study shows a normal sized heart.. Partially 10 and 15% persistent left pneumothorax. 18 mm left lung nodule. Indwelling left chest tube.Marland Kitchen        IMPRESSION: 1. 18 mm left lung nodule. 2. 15% left pneumothorax. 3. Indwelling left chest tube.     Xr Chest Sngl V    Result Date: 08/30/2018  INDICATION: Pneumothorax   EXAMINATION: XR CHEST SNGL V COMPARISON: 08/29/2018 FINDINGS: The study shows a normal sized heart.. Proximally 10-15% residual left apical pneumothorax.. Indwelling left chest tube. Left lung mass..        IMPRESSION: 1. Similar 10-15% left pneumothorax. 2. Indwelling left chest tube. 3. Left lung mass.     Xr Chest Sngl V    Result Date: 08/29/2018  EXAMINATION: XR CHEST SNGL V CLINICAL INDICATION: Pneumothorax     COMPARISON:  August 28, 2018 TECHNIQUE: AP portable chest FINDINGS: 15 mm nodule left mid chest. Chest tube left mid to lower lung. Tip now directed medially. Marked improvement of the left pneumothorax. Left apical lines medially and a line to the left of the aortic arch suggest residual pneumothorax. Lucency in left mid chest may be due to a superimposed loculated pneumothorax or bulla. Mild right basilar infiltrate. Mild cardiomegaly.     IMPRESSION:  1. Marked reduction in the left-sided pneumothorax. Likely less than 15%-20% 2. Left lung nodule. 3. Left chest tube with tip now directed medially     Xr Chest Sngl V    Result Date: 08/28/2018  Clinical history: Chest tube placement EXAMINATION: Single view of the chest 08/28/2018 Correlation: CT scan 08/28/2018 FINDINGS: Trachea and heart size are within normal limits. Right lung is clear. There are left lung  nodules. Placement of left chest tube. Small left apical lateral pneumothorax.     IMPRESSION: 1. Placement of left chest tube, left apical lateral pneumothorax. 2. Left lung nodules.     Xr Chest Sngl V    Result Date: 08/28/2018  EXAM:  Chest AP INDICATIONS: sob, COMPARISON: 08/22/2018. FINDINGS: New hyperlucency of the left upper lung zone with a triangular opacity in the left midlung zone. Findings are concerning for pneumothorax with associated partial atelectasis/collapse of the upper lobe. Stable nodular density in the left lower lung zone. The right lung is clear. Normal cardiomediastinal contours.     IMPRESSION: New large left pneumothorax. Results discussed with Dr. Laveda Norman on 08/28/2018 at 11:40 AM.     Xr Chest Sngl V    Result Date: 08/22/2018  Clinical history: Shortness of breath EXAMINATION: Single view of the chest 08/22/2018 Correlation: Chest radiograph 08/04/2018 and CTA chest 02/18/2018 FINDINGS: Trachea and heart size are within normal limits. Scarring/atelectasis left mid to lower lung. 2 cm left lower lobe nodule unchanged from chest radiograph of 08/04/2018 and CTA chest 02/18/2018. Right lung is clear.  IMPRESSION: 1. No acute pulmonary process. 2. 2 cm stable nodule left lower lobe unchanged from 08/04/2018 and evident on CT scan 02/18/2018.     Mri Brain Wo Cont    Result Date: 08/08/2018  EXAMINATION: MRI BRAIN WO CONT CLINICAL INDICATION: intractable HA     COMPARISON:  April 09, 2015 TECHNIQUE: T1 and T2-weighted sequences of the brain were obtained in multiple planes without contrast. FINDINGS: No restricted diffusion. Stable left posterior fossa dural based mass 10 mm, abutting tentorium. Isointense on T1 and T2. Findings typical of meningioma. Stable left frontal scalp subgaleal lipoma. Small right frontal subgaleal lipoma. No  no midline shift. No hydrocephalus. No extra-axial fluid collections. Visualized portions of the orbits grossly  unremarkable. Mastoid air cells well aerated. Visualized portions of paranasal sinuses demonstrate mucosal thickening posterior right ethmoid air cells and right sphenoid sinus. Intracranial flow voids of great vessels, grossly intact. Extensive confluent T2 prolongation in corona radiata and centrum semiovale, more than expected for patient's age. Extensive subcortical T2 prolongation in the frontoparietal lobes. T2 prolongation central pons. Marked frontotemporal lobe atrophy. No susceptibility artifact on gradient echo imaging to suggest hemorrhage. Cerebellar tonsil normal.          IMPRESSION: 1.   Stable left posterior fossa dural based mass. Long-term stability would be consistent with meningioma. 2. No acute infarction. 3. Extensive chronic microvascular ischemic change     Ct Chest Wo Cont    Result Date: 08/30/2018  Indication: Apical pneumothorax versus blebs following chest tube insertion.     IMPRESSION: Large biapical subpleural blebs and bullae. No pneumothorax. 17 mm left lung nodule. Comment: CT images of the chest were obtained without intravenous contrast. This was compared with all recent studies. A 9 French left chest tube is in place. There is no pneumothorax. Subpleural blebs and bullae are present in both lungs particularly in the upper lobes creating the lines simulating left pneumothorax on plain films. A 17 mm left lower lobe nodule. Atelectatic consolidation is present in the left upper lobe. There is minimal subsegmental atelectasis at the right lung base. The heart is of normal size. Aorta is of normal caliber. Focal calcification present in the left breast. These findings were discussed with Javier Glazier, PA.Marland Kitchen DICOM format imaged data is available to non-affiliated external healthcare facilities or entities on a secure, media free, reciprocally searchable basis with patient authorization  for 12 months following the date of the study.     Ct Guide Insrt Chest Tube     Result Date: 08/28/2018  Indication: Spontaneous left pneumothorax.     IMPRESSION: Using aseptic technique under CT guidance 9 French left chest tube was placed and connected to continuous low pressure suction. The pneumothorax was evacuated. The catheter was secured in place. There were no complications and the patient left the radiology department in satisfactory condition. 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 Fentanyl sedation was utilized by the radiology nurse. Start time 1444 and end time 1504. DICOM format imaged data is available to non-affiliated external healthcare facilities or entities on a secure, media free, reciprocally searchable basis with patient authorization  for 12 months following the date of the study.     Duplex Temporal Art Right Ltd    Result Date: 08/07/2018  Study ID: [id number]                                        [facility]                                           [address] [place],                                         [place]                                          [address]                          Temporal Artery Duplex Report Name: ICHELLE, HARRAL          Study Date: 08/07/2018 09:54 AM MRN: [id number]                    Patient Location: [id number] DOB: June 02, 1951                Age: 68 yrs Gender: Female                 Account #: [id number] Reason For Study: Headaches Ordering Physician: Scarlette Ar Performed By: Eddie North, RVT Interpretation Summary Normal velocities were dected in all vessels. No sign of halo detected around the parietal, frontal and temporal arteries. ___________________________________________________________________________ __ __ Quality/Procedure Limited color duplex ultrasound of the temporal arteries was performed. ICD 10: R51. History/symptoms Diabetes. HTN. COPD. Headaches. Right Superficial Temporal Artery Velocities RT: Proximal superficial temporal branch 39.6cm/s. RT: Mid superficial temporal branch 45.3cm/s. RT: Distal superficial temporal branch 40.6cm/s. Right Frontal Branch Velocities RT: Distal frontal branch 21.2cm/s. RT: Mid frontal branch 15.3cm/s. RT: Proximal frontal branch 27.8cm/s. Right Parietal Branch Velocities RT: Proximal parietal branch 38.4cm/s. RT: Mid parietal branch 24.9cm/s. RT: Distal parietal branch 30.3cm/s. Electronically signed  byDr. Neta Ehlers, M.D   08/07/2018 04:43 PM      Microbiology:  All Micro Results     Procedure Component Value Units Date/Time    CULTURE, RESPIRATORY/SPUTUM/BRONCH Gay Filler STAIN [[id number]]  (Abnormal) Collected:  08/24/18 1554    Order Status:  Completed Specimen:  Respiratory specimen from Sputum Updated:  08/27/18 1457     GRAM STAIN       Rare Gram Positive Cocci In Clusters  Rare Gram Positive Bacilli  Few Gram Positive Cocci In Chains  <10 Epithelial cells/lpf  10 - 25 WBC's/lpf  Mucus Present       Culture result       Moderate Commensal Flora Isolated                 Cardiology:  Results for orders placed or performed during the hospital encounter of 08/22/18   EKG, 12 LEAD, INITIAL   Result Value Ref Range    Ventricular Rate 120 BPM    Atrial Rate 120 BPM    P-R Interval 144 ms    QRS Duration 72 ms    Q-T Interval 298 ms    QTC Calculation (Bezet) 421 ms    Calculated P Axis 72 degrees    Calculated R Axis 70 degrees    Calculated T Axis 66 degrees    Diagnosis       Sinus tachycardia  Otherwise normal ECG  When compared with ECG of 04-Aug-2018 23:00,  premature supraventricular complexes are no longer present  Nonspecific T wave abnormality no longer evident in Inferior leads  Nonspecific T wave abnormality no longer evident in Lateral leads   Confirmed by Yetta Barre, M.D., John P ([id number]) on 08/28/2018 4:43:46 PM           Problem List:  Problem List as of 09/06/2018 Date Reviewed: 16-Aug-2018          Codes Class Noted - Resolved    Respiratory failure with hypoxia (HCC) ICD-10-CM: J96.91  ICD-9-CM: 518.81  08/22/2018 - Present        Headache ICD-10-CM: R51  ICD-9-CM: 784.0  08/05/2018 - Present        Abdominal pain ICD-10-CM: R10.9  ICD-9-CM: 789.00  02/04/2018 - Present        SIRS (systemic inflammatory response syndrome) (HCC) ICD-10-CM: R65.10  ICD-9-CM: 995.90  10/09/2017 - Present        Hyperkalemia ICD-10-CM: E87.5  ICD-9-CM: 276.7  09/08/2017 - Present        Obtunded ICD-10-CM: R40.1  ICD-9-CM: 780.09  09/08/2017 - Present        Acute renal failure (ARF) (HCC) ICD-10-CM: N17.9  ICD-9-CM: 584.9  09/08/2017 - Present        Septic shock (HCC) ICD-10-CM: A41.9, R65.21  ICD-9-CM: 038.9, 785.52, 995.92  09/08/2017 - Present        Metabolic encephalopathy ICD-10-CM: G93.41  ICD-9-CM: 348.31  09/08/2017 - Present        Dehydration ICD-10-CM: E86.0  ICD-9-CM: 276.51  08/07/2017 - Present        COPD with acute exacerbation (HCC) ICD-10-CM: J44.1  ICD-9-CM: 491.21  08/07/2017 - Present        Acute-on-chronic kidney injury (HCC) ICD-10-CM: N17.9, N18.9  ICD-9-CM: 584.9, 585.9  08/07/2017 - Present        Chest pain ICD-10-CM: R07.9  ICD-9-CM: 786.50  06/15/2017 - Present        Dyspnea ICD-10-CM: R06.00  ICD-9-CM: 786.09  06/15/2017 - Present  Type 2 diabetes mellitus with diabetic neuropathy (HCC) ICD-10-CM: E11.40  ICD-9-CM: 250.60, 357.2  12/07/2016 - Present        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  ICD-9-CM: 569.89  08/17/2016 - Present        Cannabinoid hyperemesis syndrome (HCC) ICD-10-CM: F12.988  ICD-9-CM: 536.2, 305.20  08/17/2016 - Present        Gastritis ICD-10-CM: K29.70  ICD-9-CM: 535.50  08/16/2016 - Present        Nausea & vomiting ICD-10-CM: R11.2  ICD-9-CM: 787.01  08/16/2016 - Present        Asthma with acute exacerbation ICD-10-CM: J45.901   ICD-9-CM: 493.92  08/04/2016 - Present        Lactic acidosis ICD-10-CM: E87.2  ICD-9-CM: 276.2  07/24/2016 - Present        Leukocytosis ICD-10-CM: D72.829  ICD-9-CM: 288.60  07/24/2016 - Present        Sepsis (HCC) ICD-10-CM: A41.9  ICD-9-CM: 038.9, 995.91  07/24/2016 - Present        Asthma exacerbation ICD-10-CM: J45.901  ICD-9-CM: 493.92  07/24/2016 - Present        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: E11.21  ICD-9-CM: 250.40, 583.81  06/12/2016 - Present        Sacroiliitis (HCC) ICD-10-CM: M46.1  ICD-9-CM: 720.2  11/25/2015 - Present        Essential hypertension ICD-10-CM: I10  ICD-9-CM: 401.9  10/06/2015 - Present        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  ICD-9-CM: 721.3  09/15/2015 - Present        Lumbar and sacral osteoarthritis ICD-10-CM: M47.817  ICD-9-CM: 721.3  09/15/2015 - Present        Chronic pain syndrome ICD-10-CM: G89.4  ICD-9-CM: 338.4  09/15/2015 - Present        Type 2 diabetes mellitus with hyperglycemia, without long-term current use of insulin (HCC) ICD-10-CM: E11.65  ICD-9-CM: 250.00, 790.29  08/20/2015 - Present        Acute colitis ICD-10-CM: K52.9  ICD-9-CM: 558.9  07/02/2015 - Present        Acute hyperglycemia ICD-10-CM: R73.9  ICD-9-CM: 790.29  07/02/2015 - Present        Accelerated hypertension ICD-10-CM: I10  ICD-9-CM: 401.0  04/08/2015 - Present        Severe headache ICD-10-CM: R51  ICD-9-CM: 784.0  04/07/2015 - Present        Osteoarthritis of hips, 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  ICD-9-CM: 715.95  01/29/2015 - Present        PTSD (post-traumatic stress disorder) (Chronic) ICD-10-CM: F43.10  ICD-9-CM: 309.81  01/29/2015 - Present        Severe hypertension (Chronic) ICD-10-CM: I10  ICD-9-CM: 401.9  07/21/2014 - Present        RESOLVED: COPD exacerbation (HCC) ICD-10-CM: J44.1  ICD-9-CM: 491.21  11/14/2017 - 08/08/2018        RESOLVED: New onset type 1 diabetes mellitus, uncontrolled (HCC) ICD-10-CM: E10.65   ICD-9-CM: 250.03  07/02/2015 - 10/06/2015        RESOLVED: Recurrent major depressive disorder, in full remission (HCC) ICD-10-CM: F33.42  ICD-9-CM: 296.36  12/02/2014 - 12/02/2014        RESOLVED: Foreign body in colon ICD-10-CM: T18.4XXA  ICD-9-CM: 936  08/14/2014 - 01/29/2015        RESOLVED: Advanced care planning/counseling discussion ICD-10-CM: Z61.09Z71.89  ICD-9-CM: V65.49  07/21/2014 - 01/29/2015    Overview Signed 07/21/2014  3:46 PM by Littie Deedsharakan, Jolson K  Patient given [place] application               RESOLVED: Hip pain, chronic ICD-10-CM: M25.559, G89.29  ICD-9-CM: 719.45, 338.29  07/21/2014 - 01/29/2015              Medications reviewed  Current Facility-Administered Medications   Medication Dose Route Frequency   ??? HYDROcodone-acetaminophen (NORCO) 5-325 mg per tablet 1 Tab  1 Tab Oral Q4H PRN   ??? rosuvastatin (CRESTOR) tablet 5 mg  5 mg Oral QHS   ??? albuterol-ipratropium (DUO-NEB) 2.5 MG-0.5 MG/3 ML  3 mL Nebulization Q6H RT   ??? predniSONE (DELTASONE) tablet 40 mg  40 mg Oral DAILY WITH BREAKFAST   ??? dilTIAZem CD (CARDIZEM CD) capsule 240 mg  240 mg Oral DAILY   ??? losartan (COZAAR) tablet 50 mg  50 mg Oral DAILY   ??? polyethylene glycol (MIRALAX) packet 17 g  17 g Oral BID PRN   ??? bisacodyL (DULCOLAX) suppository 10 mg  10 mg Rectal DAILY PRN   ??? guaiFENesin ER (MUCINEX) tablet 1,200 mg  1,200 mg Oral Q12H   ??? cetirizine (ZYRTEC) tablet 10 mg  10 mg Oral DAILY   ??? fluticasone propionate (FLONASE) 50 mcg/actuation nasal spray 2 Spray  2 Spray Both Nostrils BID   ??? docusate sodium (COLACE) capsule 100 mg  100 mg Oral BID   ??? hydrOXYzine HCL (ATARAX) tablet 25 mg  25 mg Oral TID PRN   ??? cloNIDine HCL (CATAPRES) tablet 0.3 mg  0.3 mg Oral BID   ??? QUEtiapine SR (SEROquel XR) tablet 150 mg  150 mg Oral QHS   ??? revefenacin (YUPELRI) nebulizer solution 175 mcg  175 mcg Nebulization DAILY   ??? dextromethorphan (DELSYM) 30 mg/5 mL syrup 30 mg  30 mg Oral Q12H PRN    ??? gabapentin (NEURONTIN) capsule 300 mg  300 mg Oral TID   ??? montelukast (SINGULAIR) tablet 10 mg  10 mg Oral QHS   ??? naloxone (NARCAN) injection 0.1 mg  0.1 mg IntraVENous PRN   ??? acetaminophen (TYLENOL) tablet 650 mg  650 mg Oral Q4H PRN   ??? ondansetron (ZOFRAN) injection 4 mg  4 mg IntraVENous Q4H PRN   ??? heparin (porcine) injection 5,000 Units  5,000 Units SubCUTAneous Q8H   ??? arformoteroL (BROVANA) neb solution 15 mcg  15 mcg Nebulization BID RT   ??? budesonide (PULMICORT) 500 mcg/2 ml nebulizer suspension  500 mcg Nebulization BID RT   ??? dextrose (D50W) injection syrg 5-25 g  10-50 mL IntraVENous PRN   ??? glucagon (GLUCAGEN) injection 1 mg  1 mg IntraMUSCular PRN   ??? insulin glargine (LANTUS) injection 1-100 Units  1-100 Units SubCUTAneous QHS   ??? insulin lispro (HUMALOG) injection 1-100 Units  1-100 Units SubCUTAneous AC&HS   ??? insulin lispro (HUMALOG) injection 1-100 Units  1-100 Units SubCUTAneous PRN         [name] Bimler, MD

## 2018-09-06 NOTE — Progress Notes (Signed)
Dc plans: Home with husband and his assistance and they both declined SNF and HH care/ at first she chose [facility] for HH but then she called her husband and they do Not want people coming and going from home during the corona virus outbreak, also for that reason they do not want rehab/SNF/ per husband he is aware of what HH does and offers and he said I can assist her and do the exercises with her. He said I am familiar with HH and what it offers but we can do that in home without HH> he wants [employer] medicare transport cab home when she is dc/ she will be going to home address and he will be there. 1 step into home./ she has chair lift to get to second level of home.

## 2018-09-06 NOTE — Progress Notes (Signed)
OCCUPATIONAL THERAPY EVALUATION     [name] [name]: [name] [name] (68 y.o. female)  Room: 5110/5110    Primary Diagnosis: Respiratory failure with hypoxia [facility]) [J96.91]       Date of Admission: 08/22/2018   Length of Stay:  15 day(s)  Insurance: Payor: [employer] MEDICARE / Plan: [place] / Product Type: Managed Care Medicare /      Date: 09/06/2018  In time:  1109        Out time:  1142    Precautions:Falls, fatigues easily, monitor vitals   Ordered Weight Bearing Status: NA    Isolation:  There are currently no Active Isolations       MDRO: No active infections        ASSESSMENT :   Based on the objective data described below, the [name] [name] presents with   - generalized muscle weakness affecting function in ADLs  - bilateral upper extremity and bilateral lower extremity weakness  - decreased functional standing balance    - decreased functional mobility   - decreased tolerance to sustained activity  affecting [name] [name]'s safety and independence/ability to perform basic ADLs/IADLs    [name] [name] will benefit from skilled occupational therapy intervention to address the above impairments.    [name] [name]???s rehabilitation potential is considered to be Good for below stated goals.     Recommendations:  Recommend continued occupational therapy during acute stay. Recommend out of bed activity to counteract ill effects of bedrest, with assistance from staff as needed.  Discharge Recommendations: Home health OT, Pt husband will be home with her upon d/c.   Further Equipment Recommendations for Discharge: none identified at this time     Plan:   [name] [name] will be followed by occupational therapy to address goals while hospitalized as [name] [name]'s status and schedule permit.   [name] [name] to be seen 1-5x/week x 2 weeks.    Planned interventions may include any combination of the following: Adaptive equipment, ADI training, activity tolerance, functional balance training, functional mobility training, therapeutic exercise, therapeutic  activity, [name] [name]/caregiver education and training, home exercise program, neuromuscular re-education and energy conservation    [name] [name] and/or family have participated as able in goal setting and plan of care.  Occupational Therapy goals:     OT goals initiated 09/06/2018 and will be met by [name] [name] within 1-2 weeks in order to promote maximal independence in ADLs/IADLs.    Pt will be supervision with LB dressing sitting EOB/bedside chair w/ AE as needed.    Pt will be modified independent with toilet transfer.   Pt will increase static and dynamic standing balance to good during functional ADL tasks.   Pt will tolerate standing for 5 minutes to promote maximal independence in ADLs.    Pt will increase bilateral  upper extremity strength to 5/5.  Pt will demonstrate teach back of energy conservation techniques w/ Modified independence during functional activities.       Education/ communication:     Barriers to Learning/Limitations:  None  Education provided to: [name] [name] on (+) role of OT, (+) OT plan of care, (+) instructed [name] [name] on the importance of activity while hospitalized to prevent a decline in function, (+) encouraged [name] [name] to sit up in chair for 45 (+) minutes or as tolerated 2-3 times a day, with staff assistance as needed, (+) the importance of maintaining UE muscle strength and activity tolerance while hospitalized to prevent a decline in function, (+) staff assistance with mobility, (+) change positions frequently, (+) functional mobility, (+) home safety, bathroom  safety, functional mobility, (+) ADL training, (+) energy conservation techniques  Educational Handouts issued: none this session  [name] [name] / Family readiness to learn indicated by: verbalized understanding, demonstrated understanding, showing interest and trying to perform skills    SUBJECTIVE:     [name] [name] "I moved down here about 5 years ago from [place]"    Pain Assessment: none reported    OBJECTIVE DATA SUMMARY:      Orders, labs, and chart reviewed on [name] [name]. Communicated with [name] Rowan, RN    [name] [name] was admitted to the hospital on 08/22/2018 with   Chief Complaint   [name] [name] presents with   ??? Respiratory Distress     Present illness history:   [name] [name] Active Problem List    Diagnosis Date Noted   ??? Respiratory failure with hypoxia ([place]) 08/22/2018   ??? Headache 08/05/2018   ??? Abdominal pain 02/04/2018   ??? SIRS (systemic inflammatory response syndrome) ([place]) 10/09/2017   ??? Hyperkalemia 09/08/2017   ??? Obtunded 09/08/2017   ??? Acute renal failure (ARF) ([place]) 09/08/2017   ??? Septic shock (HCC) 04/06/8526   ??? Metabolic encephalopathy 78/24/2353   ??? Dehydration 08/07/2017   ??? COPD with acute exacerbation ([place]) 08/07/2017   ??? Acute-on-chronic kidney injury ([place]) 08/07/2017   ??? Chest pain 06/15/2017   ??? Dyspnea 06/15/2017   ??? Type 2 diabetes mellitus with diabetic neuropathy ([place]) 12/07/2016   ??? 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 Cannabinoid hyperemesis syndrome ([place]) 08/17/2016   ??? Gastritis 08/16/2016   ??? Nausea & vomiting 08/16/2016   ??? Asthma with acute exacerbation 08/04/2016   ??? Lactic acidosis 07/24/2016   ??? Leukocytosis 07/24/2016   ??? Sepsis ([place]) 07/24/2016   ??? Asthma exacerbation 07/24/2016   ??? Type 2 diabetes mellitus with nephropathy ([place]) 06/12/2016   ??? Sacroiliitis ([place]) 11/25/2015   ??? Essential hypertension 10/06/2015   ??? 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 Lumbar and sacral osteoarthritis 09/15/2015   ??? Chronic pain syndrome 09/15/2015   ??? Type 2 diabetes mellitus with hyperglycemia, without long-term current use of insulin ([place]) 08/20/2015   ??? Acute colitis 07/02/2015   ??? Acute hyperglycemia 07/02/2015   ??? Accelerated hypertension 04/08/2015   ??? Severe headache 04/07/2015   ??? 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 PTSD (post-traumatic stress disorder) 01/29/2015   ??? Severe hypertension 07/21/2014      Previous medical history:   Past Medical History:    Diagnosis Date   ??? Arthritis    ??? 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 DM2 (diabetes mellitus, type 2) ([place])    ??? GERD (gastroesophageal reflux disease)    ??? Hepatitis C     HEP C   ??? HTN (hypertension)    ??? Lung nodule, multiple     (CT 10/2016) Small left upper lobe and 1.7 x 1.6 cm left lower lobe nodules not significantly changed from 07/23/2016 and 03/22/2016   ??? Marijuana use    ??? PTSD (post-traumatic stress disorder)     lived through the [place] in 1993   ??? Thoracic ascending aortic aneurysm ([place])     4.4cm noted on CT Chest (10/2016)   ??? Thyroid nodule     2.5 cm stable right thyroid nodule (CT 10/2016)  [name] [name] found:     Bed, (+) bed/chair exit alarm, (+) pure wick urine system    [name] [name] received / participated in 23 minutes of treatment (therapeutic exercise, therapeutic activity, energy conservation, ADI retraining, activity tolerance) and/or educational instruction during/immediately following OT evaluation    Prior level of function / living situation status:     Information was obtained by:   [name] [name]  Home environment:   [name] [name] lives with spouse in a 2 story house 2STE. Bedroom and full bathroom on 2nd floor, has stair lift to access. Bathroom equipped with tub shower w/ transfer bench and grab bars. Toilet seat is raised height. .     Prior level of function:Pt receives light assistance from husband for BADLs and tub transfer, ambulates w/ RW.  Prior level of Instrumental Activities of Daily Living:     Pt reports husband does all cooking/cleaning and driving  Home equipment: transfer tub bench, raised toilet seat, grab bars, rolling walker, wheelchair, stair lift    Cognitive status:     Mental status:   Orientation: [name] [name] is oriented to person,place, month and year  Communication: normal  Attention Span:   good(>30mn)  Follows commands: intact   Safety/Judgement: good safety awareness  Hearing:   grossly intact  Vision:   grossly intact    Activities of daily living status:   Based on direct observation, simulation and clinical assessment.      Eating:           - not seen at meal time, but demonstrates WFLs UE ROM and strength to perform self feeding  Grooming:     - supervision, standing level  UB bathing:   - stand-by assistance, simulated  LB bathing:   - contact guard assistance, simulated  UB dressing: - supervision, edge of bed level  LB dressing: - minimum assistance, edge of bed level  Toileting:       - supervision    Comment(s):   Treatment:  - Instructed pt with positional modifications in order to increase independence with LB dressing upon return to home. Instead of forward bending, pt was guided through strategy for assuming tailor positioning for donning/doffing footwear, pt progressed to SBA for this at EOB.    Functional mobility status:     Mobility:  Rolling -  modified independent  Supine to sit -  modified independent  Sit to stand -  standby assistance, rolling walker  Stand to sit -  standby assistance, rolling walker     Transfers:  Toilet transfers: standby assistance, rolling walker    Comment(s):   Treatment:  -  [name] [name] ambulated 20 feet with standby assistance, rolling walker  in prep for household distances and IADLs.  [name] [name] required cueing for breathing technique and energy conservation    Functional balance status:     Static Sitting Balance -           good:       maintains balance against moderate resistance  Dynamic Sitting Balance -      good (-):  independent with basic dynamic balance activities   Static Standing Balance -       fair (+):    maintains balance with independence without cueing  Dynamic Standing Balance -  fair:          able to perform full UE ROM ranges with CGA/SBA    Activity Tolerance: fair, requires rest breaks, reports fatigue after activity, observed SOB with activity    Comment(s):  Treatment:  -  Energy Conservation:  Instructed, Educated [name] [name] in the following;  1. Re-arranging your environment  2. Eliminating unnecessary effort (i.e Sit rather than stand whenever possible)  3. Planning ahead (i.e Gather all supplies you need for a task or project before starting)  4. Prioritizing  5. Breathing - purse lip, deep breathing technique. 6. Techniques to calm mind and allow breathing muscles to relax.    -  [name] [name] was instructed in purse lip breathing.    -  [name] [name] required moderate  verbal  cues to use purse lip breathing during functional activities.   - O2 sats on RA w/ activity 90%, HR up to 120bpm. After cues for pursed lip breathing and rest breaks, O2 on RA increased to 94%, HR remained at 100-110bpm. RN notified.  - Pt tolerated static/dynamic standing approx 32mn at a time, 4 separate reps.     Upper extremity status:     Dominance:right  RIGHT: ACTIVE range of motion is WFL.  Strength is grossly graded as 4+/5: (Completes full range of motion against gravity with moderate to maximal resistance).  Comment: intact sensation, intact coordination  LEFT:   ACTIVE range of motion is WFL.  Strength is grossly graded as 4+/5: (Completes full range of motion against gravity with moderate to maximal resistance).  Comment: intact sensation, intact coordination    Comment(s):  Treatment:  - standing BUE AROM overhead flexion 10x2, rest breaks between sets and cues for breathing  - seated BUE AROM scapular retraction 10x w/ tactile and verbal cues, add'l cues for breathing    Final location:     Seated in bedside chair, all needs within reach, agrees to call for assistance, (+) bed/chair alarm, BLE elevated, nursing staff notified.    Thank you for this referral.    SDarrol Poke OTR/L  Pager: [phone]

## 2018-09-06 NOTE — Progress Notes (Signed)
Bedside shift change report given to Brenda RN  (oncoming nurse) by Katerina Rn (offgoing nurse). Report included the following information SBAR.

## 2018-09-06 NOTE — Progress Notes (Signed)
[facility]  Face to Face Encounter    Patient???s Name: [name] [name]           Date of Birth: [birth year]    Primary Diagnosis: Respiratory failure with hypoxia [facility]) [J96.91]                    Admit Date: 08/22/2018    Date of Face to Face:  September 06, 2018                    Medical Record Number: [id number]     Attending: [name], MD      Physician Attestation:  To be filled out by physician who conducted Face-to Face encounter.    Current Problem List:  The encounter with the patient was in whole, or in part, for the following medical condition, which is the primary reason home health care (list medical condition):    Patient Active Hospital Problem List:   Respiratory failure with hypoxia (HCC) (08/22/2018)                                Face to Face Encounter findings are related to primary reason for home care:   yes.     1. I certify that the patient needs intermittent care as follows: skilled nursing care:  skilled observation/assessment, patient education and rehabilitative nursing  physical therapy: strengthening, stretching/ROM, transfer training, gait/stair training, balance training and pt/caregiver education  occupational therapy:  ADL safety (ie. cooking, bathing, dressing), ROM and pt/caregiver education    2. I certify that this patient is homebound, that is: 1) patient requires the use of a walker device, special transportation, or assistance of another to leave the home; or 2) patient's condition makes leaving the home medically contraindicated; and 3) patient has a normal inability to leave the home and leaving the home requires considerable and taxing effort.  Patient may leave the home for infrequent and short duration for medical reasons, and occasional absences for non-medical reasons. Homebound status is due to the following functional limitations: Patient with strength deficits limiting the performance of all ADL's without  caregiver assistance or the use of an assistive device.  Patient with poor safety awareness and is at risk for falls without assistance of another person and the use of an assistive device.  Patient with poor ambulation endurance limiting their safe ability to ascend/descend the required number of steps to leave the home.    3. I certify that this patient is under my care and that I, or a nurse practitioner or physician???s assistant, or clinical nurse specialist, or certified nurse midwife, working with me, had a Face-to-Face Encounter that meets the physician Face-to-Face Encounter requirements.  The following are the clinical findings from the Face-to-Face encounter that support the need for skilled services and is a summary of the encounter:     See discharge summary    Electronically signed:  Ala Dach, RN  09/06/2018      THE FOLLOWING TO BE COMPLETED BY THE  PHYSICIAN:    I concur with the findings described above from the F2F encounter that this patient is homebound and in need of a skilled service.    Electronically signed:  Certifying Physician:

## 2018-09-06 NOTE — Progress Notes (Signed)
Nutrition note:        RD follow up per policy and there is no indication that this pt is at nutritional risk or requires further nutrition intervention at this time. Pt continues with good appetite/intake, denies n/v/d/c, abd pain, chewing/swallowing issues.     Will sign off and re-evaluate pt if/when LOS parameters are met. Thank you.    Norton Blizzard, RD  09/06/18

## 2018-09-06 NOTE — Progress Notes (Addendum)
Per MD and RN pt now would like to go to shrt term rehab. Loaded in [facility] and will need to discuss with her accepting SNFs as she will need auth. PT notes from yesterday in epic and navi. Will also have OT see her.

## 2018-09-06 NOTE — Progress Notes (Signed)
Progress Notes by Ala Dach., RN at 09/06/18 [phone]                Author: Ala Dach., RN  Service: NURSING  Author Type: Care Management       Filed: 09/06/18 0277  Date of Service: 09/06/18 4128  Status: Attested           Editor: Ala Dach., RN (Care Management)  Cosigner: [name], MD at 09/07/18 1238          Attestation signed by [name], MD at 09/07/18 1238          I concur with the findings described above from the F2F encounter that this patient is homebound and in need of a skilled service.                                 [facility]   Face to Face Encounter      Patients Name: [name] [name]            Date of Birth: Jul 20, 1950      Primary Diagnosis: Respiratory failure with hypoxia [facility]) [J96.91]                     Admit Date: 08/22/2018      Date of Face to Face:  September 06, 2018                     Medical Record Number: [id number]       Attending: [name], MD         Physician Attestation:   To be filled out by physician who conducted Face-to Face encounter.      Current Problem List:   The encounter with the patient was in whole, or in part, for the following medical condition, which is the primary reason home health care (list medical condition):      Patient Active Hospital Problem List:   Respiratory failure with hypoxia (HCC) (08/22/2018)                                   Face to Face Encounter findings are related to primary reason for home care:   yes.       1. I certify that the patient needs intermittent care as follows: skilled nursing care:  skilled observation/assessment, patient education and rehabilitative nursing   physical therapy: strengthening, stretching/ROM, transfer training, gait/stair training, balance training and pt/caregiver education   occupational therapy:  ADL safety (ie. cooking, bathing, dressing), ROM and pt/caregiver education      2. I certify that this patient is homebound, that is: 1) patient requires the  use of a walker device, special transportation, or assistance of  another to leave the home; or 2) patient's condition makes leaving the home medically contraindicated; and 3) patient has a normal inability to leave the home and leaving the home requires considerable and taxing effort.  Patient may leave the home for  infrequent and short duration for medical reasons, and occasional absences for non-medical reasons. Homebound status is due to the following functional limitations: Patient with strength deficits  limiting the performance of all ADL's without caregiver assistance or the use of an assistive device.   Patient with poor safety awareness and is at risk for  falls without assistance of another person and the use of an assistive device.  Patient with poor ambulation endurance limiting their safe ability to ascend/descend the required number of steps to  leave the home.      3. I certify that this patient is under my care and that I, or a nurse practitioner or physicians assistant, or clinical nurse specialist,  or certified nurse midwife, working with me, had a Face-to-Face Encounter that meets the physician Face-to-Face Encounter requirements.  The following are the clinical findings from the Face-to-Face encounter that support the need for skilled services  and is a summary of the encounter:       See discharge summary      Electronically signed:   Ala Dach, RN   09/06/2018         THE FOLLOWING TO BE COMPLETED BY THE  PHYSICIAN:      I concur with the findings described above from the F2F encounter that this patient is homebound and in need of a skilled service.      Electronically signed:   Certifying Physician:

## 2018-09-06 NOTE — Progress Notes (Signed)
Dc plans: Home with husband and his assistance and they both declined SNF and HH care/ at first she chose [facility] but then she called her husband and they do Not want people coming and going from home during the corona virus outbreak, also for that reason they do not want rehab/SNF/ per husband he is aware of what HH does and offers and he said I can assist her and do the exercises with her. He said I am familiar with HH and what it offers but we can do that in home without HH> he wants [facility] medicare transport cab home when she is dc/ she will be going to home address and he will be there. 1 step into home./ she has chair lift to get to second level of home.

## 2018-09-06 NOTE — Progress Notes (Signed)
Progress  Notes by [name], MD at 09/06/18 [phone]                Author: [name], MD  Service: Hospitalist  Author Type: Physician       Filed: 09/06/18 0951  Date of Service: 09/06/18 0946  Status: Signed          Editor: [name], MD (Physician)                                    Hospitalist Progress Note   [name], MD   [facility]      Daily Progress Note: 09/06/2018        Assessment/Plan:        Acute hypoxic respiratory failure    Pt was on BiPAP   Pt noted she does not use O2 at home   O2 weaned off 3/26   Mobilize pt.      Acute L pneumothorax   S/p Chest tube (removed 3/23)   Follow-up pCxR 3/24 with small apical pneumothorax smaller than on 3/23   D/C Dilaudid   Norco PRN for pain at the entry site      Acute COPD exacerbation   Seems to be resolved.   Initially on IV steroid, now weaned down to p.o. prednisone that will taper   Continue Brovana nebulizer, Pulmicort nebulizer, Zyrtec, Singulair      HTN   Controlled.     Continue clonidine patch, Cardizem CD 240 mg daily, losartan 50 mg daily      DM2   Glucomander protocol      HLD   Resume Crestor 5 mg daily      Peripheral neuropathy   Continue Neurontin 300 mg TID      PTSD   Patient is on Seroquel 100 mg at bedtime      2 cm LLL nodule   Patient will need follow-up as an outpatient with repeat CT scan      DVT prophylaxis    Heparin SQ while inpt      CODE STATUS.  Full code      Disposition.   Pt seems to have weaned off O2.    Pt initially said she did not want to go to SNF earlier in the week, but now noted that she is amenable to SNF   Case d/w the care manager to plan of SNF placement once insurance auth obtained      ------------------      36 minutes were spent on the patient of which more than 50% was spent in coordination of care and counseling (time spent with patient/family face to face, physical exam, reviewing laboratory and imaging investigations, speaking with physicians and nursing  staff involved in  this patient's care)      ------------------   Physical exam:   General:   Alert, NAD, pleasant.  Cooperative   HEENT: Sclera anicteric, PERRL, OM moist, throat clear.   Chest:   CTA B, no wheeze, no rales, no rhonchi.  Fair air movement.   CV: RRR, S1/S2 were normal, no murmur   Abdomen: NTND, soft, NABS, no masses were noted.  No rebound, no guarding.   Extremities: 1+ leg  edema.        Subjective:        Patient notes she is less short of breath.  No wheezing at  this time.  She notes that she is not on oxygen at home.  We discussed SNF and she now says she wants to go to SNF. I told her that now that she is off O2 she will need to sit in the chair and  mobilize.            Objective:        Visit Vitals      BP  118/72 (BP Patient Position: Supine)     Pulse  99     Temp  98.2 ??F (36.8 ??C)     Resp  20     Ht  5\' 8"  (1.727 m)     Wt  92.9 kg (204 lb 12.9 oz)     SpO2  95%        BMI  31.14 kg/m??      O2 Flow Rate (L/min): 2 l/min  O2 Device: Room air      Temp (24hrs), Avg:98.4 ??F (36.9 ??C), Min:98 ??F (36.7 ??C), Max:98.9 ??F (37.2 ??C)     No intake/output data recorded.    03/24 1901 - 03/26 0700   In: 2040 [P.O.:2040]   Out: 5050 [Urine:5050]           Data Review:          Recent Days:   No results for input(s): WBC, HGB, HCT, PLT, HGBEXT, HCTEXT, PLTEXT, HGBEXT, HCTEXT, PLTEXT in the last 72 hours.     Recent Labs             09/06/18   0623  09/05/18   0612  09/04/18   0608     NA  138  136  137     K  5.1  4.4  4.4     CL  103  100  101     CO2  27  30  27      GLU  139*  156*  152*     BUN  31*  29*  30*     CREA  0.8  0.8  0.8     CA  8.6  8.6  8.3*     PHOS  3.7  4.1  3.7          ALB  2.6*  2.4*  2.6*        No results for input(s): PH, PCO2, PO2, HCO3, FIO2 in the last 72 hours.      24 Hour Results:     Recent Results (from the past 24 hour(s))     GLUCOSE, POC          Collection Time: 09/05/18 12:01 PM         Result  Value  Ref Range            Glucose (POC)  67  65 - 105 mg/dL       GLUCOSE, POC           Collection Time: 09/05/18 12:25 PM         Result  Value  Ref Range            Glucose (POC)  123 (H)  65 - 105 mg/dL       GLUCOSE, POC          Collection Time: 09/05/18  4:45 PM         Result  Value  Ref Range            Glucose (POC)  262 (H)  65 - 105 mg/dL       GLUCOSE, POC          Collection Time: 09/05/18  9:14 PM         Result  Value  Ref Range            Glucose (POC)  206 (H)  65 - 105 mg/dL       RENAL FUNCTION PANEL          Collection Time: 09/06/18  6:23 AM         Result  Value  Ref Range            Sodium  138  136 - 145 mEq/L       Potassium  5.1  3.5 - 5.1 mEq/L       Chloride  103  98 - 107 mEq/L       CO2  27  21 - 32 mEq/L       Glucose  139 (H)  74 - 106 mg/dl       BUN  31 (H)  7 - 25 mg/dl       Creatinine  0.8  0.6 - 1.3 mg/dl       GFR est AA  >02.5          GFR est non-AA  >60               Calcium  8.6  8.5 - 10.1 mg/dl            Albumin  2.6 (L)  3.4 - 5.0 gm/dl       Phosphorus  3.7  2.5 - 4.9 mg/dl       Anion gap  9  5 - 15 mmol/L       GLUCOSE, POC          Collection Time: 09/06/18  8:51 AM         Result  Value  Ref Range            Glucose (POC)  197 (H)  65 - 105 mg/dL           Xr Chest Sngl V      Result Date: 09/04/2018   Clinical history: Chest pressure, chest tube removal EXAMINATION: Single view of the chest 09/04/2018 Correlation: Chest radiograph 09/03/2018 and CT scan 08/30/2018 FINDINGS: Interval removal of left chest tube. Small left apical pneumothorax. Left perihilar  opacity corresponds to atelectasis on CT scan. 1.7 cm left lower lobe nodule. Right lung is clear.       IMPRESSION: 1. Removal of right chest tube, small right apical pneumothorax. 2. Left perihilar opacity corresponding to atelectasis. 3. 1.7 cm left lower lobe nodule.       Xr Chest Sngl V      Result Date: 09/03/2018   Exam: AP portable chest Clinical indication: Dyspnea Comparison: September 01, 2018 Results:  No consolidation.  No pleural effusions.    17 mm nodule left mid chest. Bullous  disease left upper lobe. Persistent left upper lobe pneumothorax, unchanged. Left  mid chest opacity. Unchanged. Heart normal.  Mediastinal contours are normal. Left chest tube unchanged. No free air is seen under the hemidiaphragms.   Osseous structures intact.       IMPRESSION:   1.  Persistent episode left upper lobe pneumothorax, unchanged. Left lung nodule. Stable bullous disease. 2. Left mid lung opacities likely representing atelectasis/consolidation, unchanged. 3. Left chest tube unchanged.  Xr Chest Sngl V      Result Date: 09/01/2018   INDICATION: Pneumothorax.    COMPARISON: 08/31/2018 TECHNIQUE: Single view chest.       IMPRESSION: There is left lung pulmonary nodule, unchanged. Subtle left 10% apical pneumothorax, relatively unchanged. There is left-sided chest tube. Patchy infiltrates in the right lower lung zone, slightly increased since prior study.       Xr Chest Sngl V      Result Date: 08/31/2018   INDICATION: Pneumothorax   EXAMINATION: XR CHEST SNGL V COMPARISON: 08/30/2018 FINDINGS: The study shows a normal sized heart.. Partially 10 and 15% persistent left pneumothorax. 18 mm left lung nodule. Indwelling left chest tube.Marland Kitchen          IMPRESSION: 1. 18 mm left lung nodule. 2. 15% left pneumothorax. 3. Indwelling left chest tube.       Xr Chest Sngl V      Result Date: 08/30/2018   INDICATION: Pneumothorax   EXAMINATION: XR CHEST SNGL V COMPARISON: 08/29/2018 FINDINGS: The study shows a normal sized heart.. Proximally 10-15% residual left apical pneumothorax.. Indwelling left chest tube. Left lung mass..          IMPRESSION: 1. Similar 10-15% left pneumothorax. 2. Indwelling left chest tube. 3. Left lung mass.       Xr Chest Sngl V      Result Date: 08/29/2018   EXAMINATION: XR CHEST SNGL V CLINICAL INDICATION: Pneumothorax     COMPARISON:  August 28, 2018 TECHNIQUE: AP portable chest FINDINGS: 15 mm nodule left mid chest. Chest tube left mid to lower lung. Tip now directed medially. Marked  improvement of the  left pneumothorax. Left apical lines medially and a line to the left of the aortic arch suggest residual pneumothorax. Lucency in left mid chest may be due to a superimposed loculated pneumothorax or bulla. Mild right basilar infiltrate. Mild cardiomegaly.       IMPRESSION:  1. Marked reduction in the left-sided pneumothorax. Likely less than 15%-20% 2. Left lung nodule. 3. Left chest tube with tip now directed medially       Xr Chest Sngl V      Result Date: 08/28/2018   Clinical history: Chest tube placement EXAMINATION: Single view of the chest 08/28/2018 Correlation: CT scan 08/28/2018 FINDINGS: Trachea and heart size are within normal limits. Right lung is clear. There are left lung nodules. Placement of left chest  tube. Small left apical lateral pneumothorax.       IMPRESSION: 1. Placement of left chest tube, left apical lateral pneumothorax. 2. Left lung nodules.       Xr Chest Sngl V      Result Date: 08/28/2018   EXAM:  Chest AP INDICATIONS: sob, COMPARISON: 08/22/2018. FINDINGS: New hyperlucency of the left upper lung zone with a triangular opacity in the left midlung zone. Findings are concerning for pneumothorax with associated partial atelectasis/collapse of  the upper lobe. Stable nodular density in the left lower lung zone. The right lung is clear. Normal cardiomediastinal contours.       IMPRESSION: New large left pneumothorax. Results discussed with Dr. Laveda Norman on 08/28/2018 at 11:40 AM.       Xr Chest Sngl V      Result Date: 08/22/2018   Clinical history: Shortness of breath EXAMINATION: Single view of the chest 08/22/2018 Correlation: Chest radiograph 08/04/2018 and CTA chest 02/18/2018 FINDINGS: Trachea and heart size are within normal limits. Scarring/atelectasis left mid to lower lung.  2 cm left  lower lobe nodule unchanged from chest radiograph of 08/04/2018 and CTA chest 02/18/2018. Right lung is clear.       IMPRESSION: 1. No acute pulmonary process. 2. 2 cm stable nodule left lower  lobe unchanged from 08/04/2018 and evident on CT scan 02/18/2018.       Mri Brain Wo Cont      Result Date: 08/08/2018   EXAMINATION: MRI BRAIN WO CONT CLINICAL INDICATION: intractable HA     COMPARISON:  April 09, 2015 TECHNIQUE: T1 and T2-weighted sequences of the brain were obtained in multiple planes without contrast. FINDINGS: No restricted diffusion. Stable left  posterior fossa dural based mass 10 mm, abutting tentorium. Isointense on T1 and T2. Findings typical of meningioma. Stable left frontal scalp subgaleal lipoma. Small right frontal subgaleal lipoma. No  no midline shift. No hydrocephalus. No extra-axial  fluid collections. Visualized portions of the orbits grossly unremarkable. Mastoid air cells well aerated. Visualized portions of paranasal sinuses demonstrate mucosal thickening posterior right ethmoid air cells and right sphenoid sinus. Intracranial  flow voids of great vessels, grossly intact. Extensive confluent T2 prolongation in corona radiata and centrum semiovale, more than expected for patient's age. Extensive subcortical T2 prolongation in the frontoparietal lobes. T2 prolongation central  pons. Marked frontotemporal lobe atrophy. No susceptibility artifact on gradient echo imaging to suggest hemorrhage. Cerebellar tonsil normal.            IMPRESSION: 1.   Stable left posterior fossa dural based mass. Long-term stability would be consistent with meningioma. 2. No acute infarction. 3. Extensive chronic microvascular ischemic change       Ct Chest Wo Cont      Result Date: 08/30/2018   Indication: Apical pneumothorax versus blebs following chest tube insertion.       IMPRESSION: Large biapical subpleural blebs and bullae. No pneumothorax. 17 mm left lung nodule. Comment: CT images of the chest were obtained without intravenous contrast. This was compared with all recent studies. A 9 French left chest tube is in place.  There is no pneumothorax. Subpleural blebs and bullae are present in both  lungs particularly in the upper lobes creating the lines simulating left pneumothorax on plain films. A 17 mm left lower lobe nodule. Atelectatic consolidation is present in the  left upper lobe. There is minimal subsegmental atelectasis at the right lung base. The heart is of normal size. Aorta is of normal caliber. Focal calcification present in the left breast. These findings were discussed with Javier GlazierAllison Sinesi, PA.Marland Kitchen. DICOM format  imaged data is available to non-affiliated external healthcare facilities or entities on a secure, media free, reciprocally searchable basis with patient authorization  for 12 months following the date of the study.       Ct Guide Insrt Chest Tube      Result Date: 08/28/2018   Indication: Spontaneous left pneumothorax.       IMPRESSION: Using aseptic technique under CT guidance 9 French left chest tube was placed and connected to continuous low pressure suction. The pneumothorax was evacuated. The catheter was secured in place. There were no complications and the patient  left the radiology department in satisfactory condition. 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 Fentanyl sedation was utilized by the radiology nurse. Start time 1444 and end time 1504. DICOM format imaged data is available to non-affiliated external  healthcare facilities or entities on a secure, media free, reciprocally searchable basis with patient authorization  for 12 months following the date of the study.  Duplex Temporal Art Right Ltd      Result Date: 08/07/2018                                                                 Study ID: [id number]                                        [facility]                                            [address] [place],                                          [place]                                           [address]                          Temporal Artery Duplex Report Name: JENNIFIER, SMITHERMAN          Study Date: 08/07/2018 09:54 AM MRN: [id number]                    Patient Location: [id number] DOB: [birth year]                Age: 67 yrs Gender:  Female                 Account #: [id number] Reason For Study: Headaches Ordering Physician: Scarlette Ar Performed By: Eddie North, RVT Interpretation Summary Normal velocities were dected in all vessels. No sign of halo detected around the  parietal, frontal and temporal arteries. _____________________________________________________________________________ __ Quality/Procedure  Limited color duplex ultrasound of the temporal arteries was performed. ICD 10: R51. History/symptoms Diabetes.  HTN. COPD. Headaches. Right Superficial Temporal Artery Velocities RT: Proximal superficial temporal branch 39.6cm/s. RT: Mid superficial temporal branch 45.3cm/s. RT: Distal superficial temporal branch 40.6cm/s. Right Frontal Branch Velocities RT: Distal  frontal branch 21.2cm/s. RT: Mid frontal branch 15.3cm/s. RT: Proximal frontal branch 27.8cm/s. Right Parietal Branch Velocities RT: Proximal parietal branch 38.4cm/s. RT: Mid parietal branch 24.9cm/s. RT: Distal parietal branch 30.3cm/s. Electronically  signed byDr. Neta Ehlers, M.D   08/07/2018 04:43 PM         Microbiology:     All Micro Results               Procedure  Component  Value  Units  Date/Time           CULTURE, RESPIRATORY/SPUTUM/BRONCH Berniece Andreas [[id number]]  (Abnormal)  Collected:  08/24/18 1554            Order Status:  Completed  Specimen:  Respiratory specimen from Sputum  Updated:  08/27/18 1457               GRAM STAIN                 Rare Gram Positive Cocci In Clusters   Rare Gram Positive Bacilli   Few Gram Positive Cocci In Chains   <10 Epithelial cells/lpf   10 - 25 WBC's/lpf   Mucus Present                    Culture result                  Moderate Commensal Flora Isolated                               Cardiology:     Results for orders placed or performed during the hospital encounter of 08/22/18     EKG, 12 LEAD, INITIAL         Result  Value  Ref Range            Ventricular Rate  120  BPM       Atrial Rate  120  BPM       P-R Interval  144  ms       QRS Duration  72  ms       Q-T Interval  298  ms       QTC Calculation (Bezet)  421  ms       Calculated P Axis  72  degrees       Calculated R Axis  70  degrees       Calculated T Axis  66  degrees       Diagnosis                 Sinus tachycardia   Otherwise normal ECG   When compared with ECG of 04-Aug-2018 23:00,   premature supraventricular complexes are no longer present   Nonspecific T wave abnormality no longer evident in Inferior leads   Nonspecific T wave abnormality no longer evident in Lateral leads   Confirmed by Yetta Barre, M.D., John P ([id number]) on 08/28/2018 4:43:46 PM                 Problem List:      Problem List  as of 09/06/2018  Date Reviewed:  08/08/2018  Codes  Class  Noted - Resolved             Respiratory failure with hypoxia (HCC)  ICD-10-CM: J96.91   ICD-9-CM: 518.81    08/22/2018 - Present                       Headache  ICD-10-CM: R51   ICD-9-CM: 784.0    08/05/2018 - Present                       Abdominal pain  ICD-10-CM: R10.9   ICD-9-CM: 789.00    02/04/2018 - Present                       SIRS (systemic inflammatory response syndrome) (HCC)  ICD-10-CM: R65.10   ICD-9-CM: 995.90    10/09/2017 - Present                       Hyperkalemia  ICD-10-CM: E87.5   ICD-9-CM: 276.7    09/08/2017 - Present                       Obtunded  ICD-10-CM: R40.1   ICD-9-CM: 780.09    09/08/2017 - Present                       Acute renal failure (ARF) (HCC)  ICD-10-CM: N17.9   ICD-9-CM: 584.9    09/08/2017 - Present                       Septic shock (HCC)  ICD-10-CM: A41.9, R65.21   ICD-9-CM: 038.9, 785.52, 995.92    09/08/2017 - Present                       Metabolic  encephalopathy  ICD-10-CM: G93.41   ICD-9-CM: 348.31    09/08/2017 - Present                       Dehydration  ICD-10-CM: E86.0   ICD-9-CM: 276.51    08/07/2017 - Present                       COPD with acute exacerbation (HCC)  ICD-10-CM: J44.1   ICD-9-CM: 491.21    08/07/2017 - Present                       Acute-on-chronic kidney injury (HCC)  ICD-10-CM: N17.9, N18.9   ICD-9-CM: 584.9, 585.9    08/07/2017 - Present                       Chest pain  ICD-10-CM: R07.9   ICD-9-CM: 786.50    06/15/2017 - Present                       Dyspnea  ICD-10-CM: R06.00   ICD-9-CM: 786.09    06/15/2017 - Present                       Type 2 diabetes mellitus with diabetic neuropathy (HCC)  ICD-10-CM: E11.40   ICD-9-CM: 250.60, 357.2    12/07/2016 - Present                       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   ICD-9-CM: 569.89    08/17/2016 - Present                       Cannabinoid hyperemesis syndrome (HCC)  ICD-10-CM: F12.988   ICD-9-CM: 536.2, 305.20    08/17/2016 - Present                       Gastritis  ICD-10-CM: K29.70   ICD-9-CM: 535.50    08/16/2016 - Present                       Nausea & vomiting  ICD-10-CM: R11.2   ICD-9-CM: 787.01    08/16/2016 - Present                       Asthma with acute exacerbation  ICD-10-CM: J45.901   ICD-9-CM: 493.92    08/04/2016 - Present                       Lactic acidosis  ICD-10-CM: E87.2   ICD-9-CM: 276.2    07/24/2016 - Present                       Leukocytosis  ICD-10-CM: D72.829   ICD-9-CM: 288.60    07/24/2016 - Present                       Sepsis (HCC)  ICD-10-CM: A41.9   ICD-9-CM: 038.9, 995.91    07/24/2016 - Present                       Asthma exacerbation  ICD-10-CM: J45.901   ICD-9-CM: 493.92    07/24/2016 - Present                       Type 2 diabetes mellitus with nephropathy (HCC)  ICD-10-CM: E11.21   ICD-9-CM: 250.40, 583.81    06/12/2016 - Present                       Sacroiliitis (HCC)  ICD-10-CM: M46.1   ICD-9-CM: 720.2    11/25/2015 - Present                        Essential hypertension  ICD-10-CM: I10   ICD-9-CM: 401.9    10/06/2015 - Present                       Spondylosis of lumbar region without myelopathy or radiculopathy  ICD-10-CM: M47.816   ICD-9-CM: 721.3    09/15/2015 - Present                       Lumbar and sacral osteoarthritis  ICD-10-CM: M47.817   ICD-9-CM: 721.3    09/15/2015 - Present                       Chronic pain syndrome  ICD-10-CM: G89.4   ICD-9-CM: 338.4    09/15/2015 - Present                       Type 2 diabetes mellitus with hyperglycemia, without long-term current use of insulin (HCC)  ICD-10-CM: E11.65   ICD-9-CM: 250.00, 790.29    08/20/2015 -  Present                       Acute colitis  ICD-10-CM: K52.9   ICD-9-CM: 558.9    07/02/2015 - Present                       Acute hyperglycemia  ICD-10-CM: R73.9   ICD-9-CM: 790.29    07/02/2015 - Present                       Accelerated hypertension  ICD-10-CM: I10   ICD-9-CM: 401.0    04/08/2015 - Present                       Severe headache  ICD-10-CM: R51   ICD-9-CM: 784.0    04/07/2015 - Present                       Osteoarthritis of hips, bilateral  ICD-10-CM: M16.0   ICD-9-CM: 715.95    01/29/2015 - Present                       PTSD (post-traumatic stress disorder) (Chronic)  ICD-10-CM: F43.10   ICD-9-CM: 309.81    01/29/2015 - Present                       Severe hypertension (Chronic)  ICD-10-CM: I10   ICD-9-CM: 401.9    07/21/2014 - Present                       RESOLVED: COPD exacerbation (HCC)  ICD-10-CM: J44.1   ICD-9-CM: 491.21    11/14/2017 - 08/08/2018                       RESOLVED: New onset type 1 diabetes mellitus, uncontrolled (HCC)  ICD-10-CM: E10.65   ICD-9-CM: 250.03    07/02/2015 - 10/06/2015                       RESOLVED: Recurrent major depressive disorder, in full remission (HCC)  ICD-10-CM: F33.42   ICD-9-CM: 296.36    12/02/2014 - 12/02/2014                       RESOLVED: Foreign body in colon  ICD-10-CM: T18.4XXA   ICD-9-CM: 936    08/14/2014 - 01/29/2015                       RESOLVED:  Advanced care planning/counseling discussion  ICD-10-CM: Z71.89   ICD-9-CM: V65.49    07/21/2014 - 01/29/2015          Overview Signed 07/21/2014  3:46 PM by Littie Deeds            Patient given [place] application                                      RESOLVED: Hip pain, chronic  ICD-10-CM: M25.559, G89.29   ICD-9-CM: 719.45, 338.29    07/21/2014 - 01/29/2015                          Medications reviewed     Current Facility-Administered Medications  Medication  Dose  Route  Frequency           ?  HYDROcodone-acetaminophen (NORCO) 5-325 mg per tablet 1 Tab   1 Tab  Oral  Q4H PRN     ?  rosuvastatin (CRESTOR) tablet 5 mg   5 mg  Oral  QHS     ?  albuterol-ipratropium (DUO-NEB) 2.5 MG-0.5 MG/3 ML   3 mL  Nebulization  Q6H RT     ?  predniSONE (DELTASONE) tablet 40 mg   40 mg  Oral  DAILY WITH BREAKFAST     ?  dilTIAZem CD (CARDIZEM CD) capsule 240 mg   240 mg  Oral  DAILY     ?  losartan (COZAAR) tablet 50 mg   50 mg  Oral  DAILY     ?  polyethylene glycol (MIRALAX) packet 17 g   17 g  Oral  BID PRN     ?  bisacodyL (DULCOLAX) suppository 10 mg   10 mg  Rectal  DAILY PRN     ?  guaiFENesin ER (MUCINEX) tablet 1,200 mg   1,200 mg  Oral  Q12H     ?  cetirizine (ZYRTEC) tablet 10 mg   10 mg  Oral  DAILY     ?  fluticasone propionate (FLONASE) 50 mcg/actuation nasal spray 2 Spray   2 Spray  Both Nostrils  BID     ?  docusate sodium (COLACE) capsule 100 mg   100 mg  Oral  BID     ?  hydrOXYzine HCL (ATARAX) tablet 25 mg   25 mg  Oral  TID PRN     ?  cloNIDine HCL (CATAPRES) tablet 0.3 mg   0.3 mg  Oral  BID     ?  QUEtiapine SR (SEROquel XR) tablet 150 mg   150 mg  Oral  QHS     ?  revefenacin (YUPELRI) nebulizer solution 175 mcg   175 mcg  Nebulization  DAILY     ?  dextromethorphan (DELSYM) 30 mg/5 mL syrup 30 mg   30 mg  Oral  Q12H PRN     ?  gabapentin (NEURONTIN) capsule 300 mg   300 mg  Oral  TID     ?  montelukast (SINGULAIR) tablet 10 mg   10 mg  Oral  QHS     ?  naloxone (NARCAN) injection 0.1 mg    0.1 mg  IntraVENous  PRN     ?  acetaminophen (TYLENOL) tablet 650 mg   650 mg  Oral  Q4H PRN     ?  ondansetron (ZOFRAN) injection 4 mg   4 mg  IntraVENous  Q4H PRN     ?  heparin (porcine) injection 5,000 Units   5,000 Units  SubCUTAneous  Q8H     ?  arformoteroL (BROVANA) neb solution 15 mcg   15 mcg  Nebulization  BID RT     ?  budesonide (PULMICORT) 500 mcg/2 ml nebulizer suspension   500 mcg  Nebulization  BID RT     ?  dextrose (D50W) injection syrg 5-25 g   10-50 mL  IntraVENous  PRN           ?  glucagon (GLUCAGEN) injection 1 mg   1 mg  IntraMUSCular  PRN           ?  insulin glargine (LANTUS) injection 1-100 Units   1-100 Units  SubCUTAneous  QHS     ?  insulin lispro (HUMALOG) injection 1-100 Units   1-100 Units  SubCUTAneous  AC&HS           ?  insulin lispro (HUMALOG) injection 1-100 Units   1-100 Units  SubCUTAneous  PRN              Kristen Loader, MD

## 2018-09-06 NOTE — Progress Notes (Signed)
Per MD and RN pt now would like to go to shrt term rehab. Loaded in [facility] and will need to discuss with her accepting SNFs as she will need auth. PT notes from yesterday in [place] and [place]. Will also have OT see her.
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
## 2018-09-07 LAB — POCT GLUCOSE
POC Glucose: 115 mg/dL — ABNORMAL HIGH (ref 65–105)
POC Glucose: 123 mg/dL — ABNORMAL HIGH (ref 65–105)

## 2018-09-07 LAB — GLUCOSE, POC
Glucose (POC): 115 mg/dL — ABNORMAL HIGH (ref 65–105)
Glucose (POC): 123 mg/dL — ABNORMAL HIGH (ref 65–105)

## 2018-09-07 MED ORDER — LOSARTAN 50 MG TAB
50 mg | ORAL_TABLET | Freq: Every day | ORAL | 1 refills | Status: DC
Start: 2018-09-07 — End: 2021-01-28

## 2018-09-07 MED ORDER — PREDNISONE 10 MG TAB
10 mg | ORAL_TABLET | ORAL | 0 refills | Status: DC
Start: 2018-09-07 — End: 2019-01-16

## 2018-09-07 MED ORDER — CETIRIZINE 10 MG TAB
10 mg | ORAL_TABLET | Freq: Every day | ORAL | 1 refills | Status: AC
Start: 2018-09-07 — End: ?

## 2018-09-07 MED ORDER — GUAIFENESIN 600 MG TABLET,EXTENDED RELEASE BIPHASIC 12 HR
600 mg | ORAL_TABLET | Freq: Two times a day (BID) | ORAL | 0 refills | Status: DC
Start: 2018-09-07 — End: 2020-06-02

## 2018-09-07 MED ORDER — FLUTICASONE 50 MCG/ACTUATION NASAL SPRAY, SUSP
50 mcg/actuation | Freq: Every day | NASAL | 1 refills | Status: DC
Start: 2018-09-07 — End: 2020-06-02

## 2018-09-07 MED ORDER — DILTIAZEM ER 240 MG 24 HR CAP
240 mg | ORAL_CAPSULE | Freq: Every day | ORAL | 1 refills | Status: DC
Start: 2018-09-07 — End: 2021-05-25

## 2018-09-07 MED FILL — HYDROCODONE-ACETAMINOPHEN 5 MG-325 MG TAB: 5-325 mg | ORAL | Qty: 1

## 2018-09-07 MED FILL — MONTELUKAST 10 MG TAB: 10 mg | ORAL | Qty: 1

## 2018-09-07 MED FILL — MUCINEX 600 MG TABLET, EXTENDED RELEASE: 600 mg | ORAL | Qty: 1

## 2018-09-07 MED FILL — YUPELRI 175 MCG/3 ML SOLUTION FOR NEBULIZATION: 175 mcg/3 mL | RESPIRATORY_TRACT | Qty: 1

## 2018-09-07 MED FILL — IPRATROPIUM-ALBUTEROL 2.5 MG-0.5 MG/3 ML NEB SOLUTION: 2.5 mg-0.5 mg/3 ml | RESPIRATORY_TRACT | Qty: 3

## 2018-09-07 MED FILL — PREDNISONE 10 MG TAB: 10 mg | ORAL | Qty: 1

## 2018-09-07 MED FILL — BROVANA 15 MCG/2 ML SOLUTION FOR NEBULIZATION: 15 mcg/2 mL | RESPIRATORY_TRACT | Qty: 1

## 2018-09-07 MED FILL — DILTIAZEM ER 120 MG 24 HR CAP: 120 mg | ORAL | Qty: 2

## 2018-09-07 MED FILL — CETIRIZINE 5 MG TAB: 5 mg | ORAL | Qty: 2

## 2018-09-07 MED FILL — DOCUSATE SODIUM 100 MG CAP: 100 mg | ORAL | Qty: 1

## 2018-09-07 MED FILL — LOSARTAN 50 MG TAB: 50 mg | ORAL | Qty: 1

## 2018-09-07 MED FILL — HEPARIN (PORCINE) 5,000 UNIT/ML IJ SOLN: 5000 unit/mL | INTRAMUSCULAR | Qty: 1

## 2018-09-07 MED FILL — QUETIAPINE SR 50 MG 24 HR TAB: 50 mg | ORAL | Qty: 3

## 2018-09-07 MED FILL — CLONIDINE 0.1 MG TAB: 0.1 mg | ORAL | Qty: 3

## 2018-09-07 MED FILL — GABAPENTIN 300 MG CAP: 300 mg | ORAL | Qty: 1

## 2018-09-07 MED FILL — BUDESONIDE 0.5 MG/2 ML NEB SUSPENSION: 0.5 mg/2 mL | RESPIRATORY_TRACT | Qty: 1

## 2018-09-07 MED FILL — ROSUVASTATIN 5 MG TAB: 5 mg | ORAL | Qty: 1

## 2018-09-07 NOTE — Discharge Summary (Signed)
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 loculated pneumothorax or bulla. Mild right basilar infiltrate. Mild cardiomegaly.     IMPRESSION:  1. Marked reduction in the left-sided pneumothorax. Likely less than 15%-20% 2. Left lung nodule. 3. Left chest tube with tip now directed medially     Xr Chest Sngl V    Result Date: 08/28/2018  Clinical history: Chest tube placement EXAMINATION: Single view of the chest 08/28/2018 Correlation: CT scan 08/28/2018 FINDINGS: Trachea and heart size are within normal limits. Right lung is clear. There are left lung nodules. Placement of left chest tube. Small left apical lateral pneumothorax.     IMPRESSION: 1. Placement of left chest tube, left apical lateral pneumothorax. 2. Left lung nodules.     Xr Chest Sngl V    Result Date: 08/28/2018  EXAM:  Chest AP INDICATIONS: sob, COMPARISON: 08/22/2018. FINDINGS: New hyperlucency of the left upper lung zone with a triangular opacity in the left midlung zone. Findings are concerning for pneumothorax with associated partial atelectasis/collapse of the upper lobe. Stable nodular density in the left lower lung zone. The right lung is clear. Normal cardiomediastinal contours.     IMPRESSION: New large left pneumothorax. Results discussed with Dr. Laveda Norman  on 08/28/2018 at 11:40 AM.     Xr Chest Sngl V    Result Date: 08/22/2018  Clinical history: Shortness of breath EXAMINATION: Single view of the chest 08/22/2018 Correlation: Chest radiograph 08/04/2018 and CTA chest 02/18/2018 FINDINGS: Trachea and heart size are within normal limits. Scarring/atelectasis left mid to lower lung. 2 cm left lower lobe nodule unchanged from chest radiograph of 08/04/2018 and CTA chest 02/18/2018. Right lung  is clear.     IMPRESSION: 1. No acute pulmonary process. 2. 2 cm stable nodule left lower lobe unchanged from 08/04/2018 and evident on CT scan 02/18/2018.     Ct Chest Wo Cont    Result Date: 08/30/2018  Indication: Apical pneumothorax versus blebs following chest tube insertion.     IMPRESSION: Large biapical subpleural blebs and bullae. No pneumothorax. 17 mm left lung nodule. Comment: CT images of the chest were obtained without intravenous contrast. This was compared with all recent studies. A 9 French left chest tube is in place. There is no pneumothorax. Subpleural blebs and bullae are present in both lungs particularly in the upper lobes creating the lines simulating left pneumothorax on plain films. A 17 mm left lower lobe nodule. Atelectatic consolidation is present in the left upper lobe. There is minimal subsegmental atelectasis at the right lung base. The heart is of normal size. Aorta is of normal caliber. Focal calcification present in the left breast. These findings were discussed with Javier Glazier, PA.Marland Kitchen DICOM format imaged data is available to non-affiliated external healthcare facilities or entities on a secure, media free, reciprocally searchable basis with patient authorization  for 12 months following the date of the study.     Ct Guide Insrt Chest Tube    Result Date: 08/28/2018  Indication: Spontaneous left pneumothorax.     IMPRESSION: Using aseptic technique under CT guidance 9 French left chest tube was placed and connected to continuous low pressure suction. The  pneumothorax was evacuated. The catheter was secured in place. There were no complications and the patient left the radiology department in satisfactory condition. 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 Fentanyl sedation was utilized by the radiology nurse. Start time 1444 and end time 1504. DICOM format imaged data is available to non-affiliated external healthcare facilities or entities on a secure, media free, reciprocally searchable basis with patient authorization  for 12 months following the date of the study.       Microbiology:  All Micro Results     Procedure Component Value Units Date/Time    CULTURE, RESPIRATORY/SPUTUM/BRONCH Berniece Andreas [[id number]]  (Abnormal) Collected:  08/24/18 1554    Order Status:  Completed Specimen:  Respiratory specimen from Sputum Updated:  08/27/18 1457     GRAM STAIN       Rare Gram Positive Cocci In Clusters  Rare Gram Positive Bacilli  Few Gram Positive Cocci In Chains  <10 Epithelial cells/lpf  10 - 25 WBC's/lpf  Mucus Present       Culture result       Moderate Commensal Flora Isolated                 Cardiology:  Results for orders placed or performed during the hospital encounter of 08/22/18   EKG, 12 LEAD, INITIAL   Result Value Ref Range    Ventricular Rate 120 BPM    Atrial Rate 120 BPM    P-R Interval 144 ms    QRS Duration 72 ms    Q-T Interval 298 ms    QTC Calculation (Bezet) 421 ms    Calculated P Axis 72 degrees    Calculated R Axis 70 degrees    Calculated T Axis 66 degrees    Diagnosis       Sinus tachycardia  Otherwise normal ECG  When compared with ECG of 04-Aug-2018 23:00,  premature supraventricular complexes are no longer present  Nonspecific T wave abnormality no longer evident in Inferior  leads  Nonspecific T wave abnormality no longer evident in Lateral leads  Confirmed by Yetta Barre, M.D., Aloha Gell [phone]) on 08/28/2018 4:43:46 PM           Problem List:  Problem List as of 09/07/2018 Date Reviewed: 09-03-18          Codes Class Noted - Resolved     Respiratory failure with hypoxia [facility]) ICD-10-CM: J96.91  ICD-9-CM: 518.81  08/22/2018 - Present        Headache ICD-10-CM: R51  ICD-9-CM: 784.0  08/05/2018 - Present        Abdominal pain ICD-10-CM: R10.9  ICD-9-CM: 789.00  02/04/2018 - Present        SIRS (systemic inflammatory response syndrome) (HCC) ICD-10-CM: R65.10  ICD-9-CM: 995.90  10/09/2017 - Present        Hyperkalemia ICD-10-CM: E87.5  ICD-9-CM: 276.7  09/08/2017 - Present        Obtunded ICD-10-CM: R40.1  ICD-9-CM: 780.09  09/08/2017 - Present        Acute renal failure (ARF) (HCC) ICD-10-CM: N17.9  ICD-9-CM: 584.9  09/08/2017 - Present        Septic shock (HCC) ICD-10-CM: A41.9, R65.21  ICD-9-CM: 038.9, 785.52, 995.92  09/08/2017 - Present        Metabolic encephalopathy ICD-10-CM: G93.41  ICD-9-CM: 348.31  09/08/2017 - Present        Dehydration ICD-10-CM: E86.0  ICD-9-CM: 276.51  08/07/2017 - Present        COPD with acute exacerbation (HCC) ICD-10-CM: J44.1  ICD-9-CM: 491.21  08/07/2017 - Present        Acute-on-chronic kidney injury (HCC) ICD-10-CM: N17.9, N18.9  ICD-9-CM: 584.9, 585.9  08/07/2017 - Present        Chest pain ICD-10-CM: R07.9  ICD-9-CM: 786.50  06/15/2017 - Present        Dyspnea ICD-10-CM: R06.00  ICD-9-CM: 786.09  06/15/2017 - Present        Type 2 diabetes mellitus with diabetic neuropathy (HCC) ICD-10-CM: E11.40  ICD-9-CM: 250.60, 357.2  12/07/2016 - Present        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  ICD-9-CM: 569.89  08/17/2016 - Present        Cannabinoid hyperemesis syndrome (HCC) ICD-10-CM: F12.988  ICD-9-CM: 536.2, 305.20  08/17/2016 - Present        Gastritis ICD-10-CM: K29.70  ICD-9-CM: 535.50  08/16/2016 - Present        Nausea & vomiting ICD-10-CM: R11.2  ICD-9-CM: 787.01  08/16/2016 - Present        Asthma with acute exacerbation ICD-10-CM: J45.901  ICD-9-CM: 493.92  08/04/2016 - Present        Lactic acidosis ICD-10-CM: E87.2  ICD-9-CM: 276.2  07/24/2016 - Present        Leukocytosis ICD-10-CM: D72.829   ICD-9-CM: 288.60  07/24/2016 - Present        Sepsis (HCC) ICD-10-CM: A41.9  ICD-9-CM: 038.9, 995.91  07/24/2016 - Present        Asthma exacerbation ICD-10-CM: J45.901  ICD-9-CM: 493.92  07/24/2016 - Present        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: E11.21  ICD-9-CM: 250.40, 583.81  06/12/2016 - Present        Sacroiliitis (HCC) ICD-10-CM: M46.1  ICD-9-CM: 720.2  11/25/2015 - Present        Essential hypertension ICD-10-CM: I10  ICD-9-CM: 401.9  10/06/2015 - Present        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  ICD-9-CM: 721.3  09/15/2015 -  Present        Lumbar and sacral osteoarthritis ICD-10-CM: M47.817  ICD-9-CM: 721.3  09/15/2015 - Present        Chronic pain syndrome ICD-10-CM: G89.4  ICD-9-CM: 338.4  09/15/2015 - Present        Type 2 diabetes mellitus with hyperglycemia, without long-term current use of insulin (HCC) ICD-10-CM: E11.65  ICD-9-CM: 250.00, 790.29  08/20/2015 - Present        Acute colitis ICD-10-CM: K52.9  ICD-9-CM: 558.9  07/02/2015 - Present        Acute hyperglycemia ICD-10-CM: R73.9  ICD-9-CM: 790.29  07/02/2015 - Present        Accelerated hypertension ICD-10-CM: I10  ICD-9-CM: 401.0  04/08/2015 - Present        Severe headache ICD-10-CM: R51  ICD-9-CM: 784.0  04/07/2015 - Present        Osteoarthritis of hips, 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  ICD-9-CM: 715.95  01/29/2015 - Present        PTSD (post-traumatic stress disorder) (Chronic) ICD-10-CM: F43.10  ICD-9-CM: 309.81  01/29/2015 - Present        Severe hypertension (Chronic) ICD-10-CM: I10  ICD-9-CM: 401.9  07/21/2014 - Present        RESOLVED: COPD exacerbation (HCC) ICD-10-CM: J44.1  ICD-9-CM: 491.21  11/14/2017 - 08/08/2018        RESOLVED: New onset type 1 diabetes mellitus, uncontrolled (HCC) ICD-10-CM: E10.65  ICD-9-CM: 250.03  07/02/2015 - 10/06/2015        RESOLVED: Recurrent major depressive disorder, in full remission (HCC) ICD-10-CM: F33.42  ICD-9-CM: 296.36  12/02/2014 - 12/02/2014         RESOLVED: Foreign body in colon ICD-10-CM: T18.4XXA  ICD-9-CM: 936  08/14/2014 - 01/29/2015        RESOLVED: Advanced care planning/counseling discussion ICD-10-CM: W11.91  ICD-9-CM: V65.49  07/21/2014 - 01/29/2015    Overview Signed 07/21/2014  3:46 PM by Littie Deeds     Patient given [place] application               RESOLVED: Hip pain, chronic ICD-10-CM: M25.559, G89.29  ICD-9-CM: 719.45, 338.29  07/21/2014 - 01/29/2015              Signed:  [name], MD  09/07/2018

## 2018-09-07 NOTE — Progress Notes (Signed)
Discharged home picked up by [employer].

## 2018-09-07 NOTE — Progress Notes (Addendum)
Transport to : HOME  Reason : HOSPITAL D/C  transport set up with : [employer] ([employer])  Time/date 09/07/2018 @ AFTER 11:30AM  Dc summary loaded N/A  nurse/cm notified   YES  envelope delivered  NO  Insurance verified on face sheet  YES  auth needed   YES  auth # [id number]

## 2018-09-07 NOTE — Progress Notes (Addendum)
Discharge Plan:   Home with husband and MD follow up. Pt and her spouse both declined HH care and declined SNF due to Covid 19 pandemic. Per husband she needs [employer] ride home. Confirmed home address and he is there waiting for her. Dc instructions per MD and dc RN to pt/ see epic for follow up care as noted.    Discharge Date:     09/07/2018       Is patient going to snf? No she and spouse declined      Home Health Needed:     Offered in lieu of declining SNF. They also declined HH    Home Health Agency:    n/a    FOC given : yes    Confirmed start of care with the home health agency and spoke with:    n/a    DME needed and ordered for Discharge:   none        Transportation:  [employer] asked [facility] to set up    Transport Time:    1130 or later per discussion with RN Mila    Family, MD, patient, nurse  aware of pickup time    Husband aware of dc will call with time once known. RN Mila aware of dc today. Husband is B Goddwin [phone] and cell if needed is [phone]/  Called him to inform of dc he is aware. She will leave today.he is aware after 1130 he asked to have wife call when they are here to get her.   Follow-Up       Follow-up With  Details  Why  Contact Info   Brock, Monique, PA-C  Call in 1 week  for follow up appointment: unable to schedule appointment @ this time; advise patient to call own appointment**  [address]  [address]  [place] [address]  [phone]      Ms Maceachern is aware to call her husband when cab arrives to get her so he will know about when she will be home.

## 2018-09-07 NOTE — Progress Notes (Signed)
Bedside shift change report given to brenda Rn (oncoming nurse) by Nora RN (offgoing nurse). Report included the following information SBAR.

## 2018-09-07 NOTE — Progress Notes (Signed)
Discharge instructions given and instructed to pick her medications from [facility] and to ask her PCP for follow-up CXR order.

## 2018-09-07 NOTE — Other (Addendum)
----------  DocumentID: [id number]------------------------------------------------              [facility]                       Patient Education Report         Name: [name] [name], [name] [name]                  Date: 08/22/2018    MRN: [id number]                    Time: 4:52:50 PM         Patient ordered video: 'Patient Safety: Stay Safe While you are in the Hospital'    from [id number] via phone number: [phone] at 4:52:50 PM    Description: This program outlines some of the precautions patients can take to ensure a speedy recovery without extra complications. The video emphasizes the importance of communicating with the healthcare team.    ----------DocumentID: [id number]------------------------------------------------                       [facility]          Patient Education Report - Discharge Summary        Date: 09/07/2018   Time: 12:04:59 PM   Name: [name] [name], [name] [name]   MRN: [id number]      Account Number: [id number]      Education History:        Patient ordered video: 'Patient Safety: Stay Safe While you are in the Hospital' from [id number] on 08/22/2018 04:52:50 PM

## 2018-09-07 NOTE — Progress Notes (Signed)
Discharged home picked up by [facility].

## 2018-09-07 NOTE — Progress Notes (Signed)
Discharge instructions given and instructed to pick her medications from [facility] and to ask her PCP for follow-up CXR order.

## 2018-09-07 NOTE — Discharge Summary (Signed)
Discharge Summary by [name], MD at 09/07/18 1101                Author: [name], MD  Service: Hospitalist  Author Type: Physician       Filed: 09/07/18 1238  Date of Service: 09/07/18 1101  Status: Signed          Editor: [name], MD (Physician)                 Physician Discharge Summary        Patient ID:   Patient Name: [name] [name]   Medical Record Number: [id number]   Date of Birth: Sep 07, 1950   Age: 68 y.o.      Primary Care Provider: [name], PA-C      Code Status: Full Code       Admit date: 08/22/2018      Discharge Date:  09/07/2018      Admitting Physician: Roderic Scarce, MD      Discharge Physician: [name], MD      Discharge Disposition: Home      Activity: Activity as tolerated      Diet:  Regular Diet and Diabetic Diet      Follow-up appointments:         Follow-up Information               Follow up With  Specialties  Details  Why  Contact Info              [name], PA-C  Physician Assistant      [address]   [address]   [place] [address]   [phone]                   Follow-up recommendations:       ? CxR PA/Lat 1-2 weeks to f/u PTX to resolution      ? CT chest 3 months to f/u LLL lung nodule to follow up on the lung nodule      ? Consider Pulmonary consultation to further evaluate the lung nodule      Patient Instructions:      Current Discharge Medication List              START taking these medications          Details        cetirizine (ZYRTEC) 10 mg tablet  Take 1 Tab by mouth daily.   Qty: 30 Tab, Refills:  1               fluticasone propionate (FLONASE) 50 mcg/actuation nasal spray  2 Sprays by Both Nostrils route daily.   Qty: 1 Bottle, Refills:  1               guaiFENesin ER (MUCINEX) 600 mg ER tablet  Take 1 Tab by mouth every twelve (12) hours.   Qty: 20 Tab, Refills:  0               predniSONE (DELTASONE) 10 mg tablet  3 tab daily x2 days, 2 tab daily x2 days, 1 tab daily x2 days   Qty: 12 Tab, Refills:  0                      CONTINUE these medications which have CHANGED          Details        dilTIAZem CD (  CARDIZEM CD) 240 mg ER capsule  Take 1 Cap by mouth daily.   Qty: 30 Cap, Refills:  1               losartan (COZAAR) 50 mg tablet  Take 1 Tab by mouth daily.   Qty: 30 Tab, Refills:  1                     CONTINUE these medications which have NOT CHANGED          Details        butalbital-acetaminophen-caffeine (FIORICET, ESGIC) 50-325-40 mg per tablet  Take 1 Tab by mouth every six (6) hours as needed for Headache. Indications: tension headache   Qty: 30 Tab, Refills:  1               LANTUS SOLOSTAR U-100 INSULIN 100 unit/mL (3 mL) inpn  INJECT 32 UNITS SUBCUTANEOUSLY  NIGHTLY   Qty: 15 mL, Refills:  0          Associated Diagnoses: Type 2 diabetes mellitus with diabetic neuropathy, without long-term current  use of insulin (HCC)               albuterol (ACCUNEB) 1.25 mg/3 mL nebu  USE 1 VIAL IN NEBULIZER EVERY 6 HOURS AS NEEDED FOR  SHORTNESS  OF  BREATH,  WHEEZING   Qty: 270 mL, Refills:  1               cloNIDine HCl (CATAPRES) 0.1 mg tablet  Take 3 Tabs by mouth two (2) times a day.   Qty: 60 Tab, Refills:  1               spironolactone (ALDACTONE) 100 mg tablet  TAKE 1 TABLET EVERY DAY   Qty: 90 Tab, Refills:  0          Associated Diagnoses: Severe hypertension               metFORMIN ER (GLUCOPHAGE XR) 500 mg tablet  TAKE 1 TABLET TWICE DAILY   Qty: 180 Tab, Refills:  1               montelukast (SINGULAIR) 10 mg tablet  TAKE 1 TABLET EVERY DAY   Qty: 90 Tab, Refills:  3               gabapentin (NEURONTIN) 300 mg capsule  Take 1 Cap by mouth three (3) times daily. Max Daily Amount: 900 mg.   Qty: 270 Cap, Refills:  1          Associated Diagnoses: Chronic right hip pain               polyethylene glycol (MIRALAX) 17 gram packet  Take 1 Packet by mouth daily.   Qty: 30 Packet, Refills:  1               magnesium oxide (MAG-OX) 400 mg tablet  Take 400 mg by mouth daily.               QUEtiapine SR (SEROQUEL XR) 150 mg sr  tablet  Take 150 mg by mouth nightly as needed.               ondansetron (ZOFRAN ODT) 4 mg disintegrating tablet  Take 1 Tab by mouth every eight (8) hours as needed for Nausea. Indications: nausea   Qty: 12 Tab, Refills:  0  tiotropium bromide (SPIRIVA RESPIMAT) 2.5 mcg/actuation inhaler  Take 1 Puff by inhalation daily.   Qty: 1 Inhaler, Refills:  0          Associated Diagnoses: COPD with acute exacerbation (HCC)               fluticasone propion-salmeterol (ADVAIR DISKUS) 100-50 mcg/dose diskus inhaler  Take 1 Puff by inhalation two (2) times a day.   Qty: 1 Inhaler, Refills:  0               traZODone (DESYREL) 50 mg tablet  Take 1 Tab by mouth nightly as needed for Sleep.   Qty: 20 Tab, Refills:  0               rosuvastatin (CRESTOR) 5 mg tablet  TAKE 1 TABLET BY MOUTH NIGHTLY   Qty: 90 Tab, Refills:  1          Associated Diagnoses: Type 2 diabetes mellitus with diabetic neuropathy, without long-term current  use of insulin (HCC)                         Admission Diagnoses: Respiratory failure with hypoxia (HCC) [J96.91]      Discharge Diagnoses: see below      Admission Condition: serious      Discharged Condition: good      Hospital Course:       The patient was admitted to the hospital with shortness of breath on 3/11.  She required BiPAP initially.  Patient was initially sent to stepdown unit.  She was placed on IV steroids, given nebulizer treatments.  She was very slow to improve.  Patient  then was noted to have an acute left pneumothorax that occurred spontaneously on 3/17.  A chest tube was placed on 3/17.  Dr. Montez Morita was consulted for management of the pneumothorax.  Patient was kept on the chest tube with wall suction until 3/23 when  she was finally weaned off the chest tube and the chest was removed at that time.  Follow-up chest x-ray on 3/24 showed only a small apical pneumothorax which is much more than on 3/23.  Patient doing well at that point.  We start weaning the IV  steroids  down to a p.o. prednisone.  This wean the patient off of oxygen and discontinued her pain medications.  Patient was a bit deconditioned, but she refused transfer to SNF and refused any home health due to the risk of exposure to coronavirus.  Patient discharged  home in good condition she is to follow-up with her PCP for repeat CT chest in 3 months recheck her in about 1 to 2 weeks to follow-up on the pneumothorax.      Acute hypoxic respiratory failure    Pt was on BiPAP   Pt noted she does not use O2 at home   O2 weaned off 3/26   Mobilize pt.   ??   Acute L pneumothorax   S/p Chest tube (removed 3/23)   Follow-up pCxR 3/24 with small apical pneumothorax smaller than on 3/23   D/C Dilaudid   Norco PRN for pain at the entry site   ??   Acute COPD exacerbation   Seems to be resolved.   Initially on IV steroid, now weaned down to p.o. prednisone that will taper   Continue Brovana nebulizer, Pulmicort nebulizer, Zyrtec, Singulair   Upon d/c to home resume home inhalers, steroid taper   Also  added Zyrtec, Flonase.   Consider f/u with pulmonary per her PCP to assist with COPD management   ??   HTN   Controlled.     Continue clonidine patch, Cardizem CD 240 mg daily, losartan 50 mg daily   ??   DM2   Glucomander protocol   ??   HLD   Resume Crestor 5 mg daily   ??   Peripheral neuropathy   Continue Neurontin 300 mg TID   ??   PTSD   Patient is on Seroquel 100 mg at bedtime   ??   17 mm LLL nodule   Patient will need follow-up as an outpatient with repeat CT scan perhaps in 3 months and consider referral to Pulmonary   ??   DVT prophylaxis    Heparin SQ while inpt   ??   CODE STATUS.  Full code   ??   Disposition.   Pt weaned off O2 while in the hospital   Pt refused SNF or Home Health (due to the coronavirus)   She noted her husband would care for her and they have DME at home to help her mobilize.   Pt has a chair lift to get to second level of home      ------------------      65 minutes were spent on the discharge of  the patient of which more than 50% was spent in coordination of care and counseling (time spent with patient/family face to face, physical exam, reviewing laboratory and imaging investigations, speaking with physicians  and nursing staff involved in this patient's care)      ------------------   Physical exam:   General:  Alert, NAD, pleasant.  Cooperative. obese   HEENT: Sclera anicteric, PERRL, OM moist, throat clear.   Chest: CTA B, no wheeze, no rales, no rhonchi.  Good air movement.   CV: RRR, S1/S2 were normal, no murmur   Abdomen: NTND, soft, NABS, no masses were noted.  No rebound, no guarding.   Extremities: No edema.        Subjective:        Pt said that she feels ok, she said she no longer wants to go to SNF or have HH follow her. She said if we did send them she would not allow them into the house due to the coronavirus.            Visit Vitals      BP  107/78 (BP 1 Location: Right arm, BP Patient Position: Supine)     Pulse  78     Temp  97.6 ??F (36.4 ??C)     Resp  18     Ht  5\' 8"  (1.727 m)     Wt  92.9 kg (204 lb 12.9 oz)     SpO2  94%        BMI  31.14 kg/m??              Initial presentation and work-up:   [As per Dr  Chanda Busing Verma's H+P from 08/22/2018]   #############   "     Chief Complaint       Patient presents with        ?  Respiratory Distress     ??   ??   HISTORY OF PRESENT ILLNESS:      Banita is a 68 y.o. BL[place] female who presents with shortness of breath which has been progressively getting worse over the past  2 days   Patient has history of COPD, has been using her home nebulizer, did not get better and called paramedics who gave her Combivent and albuterol in the road   She did start to get a little bit better, remained in respiratory distress and was extremely hypoxic when she arrived to the emergency room   Patient has been intubated in the past got extremely stressed out about needing to get intubated again   She has been coughing up white phlegm, no chest pain,  lower extremity edema or PND   She is followed by Dr. Fransisco Hertz from pulmonology and is supposed to see him on Friday   ??                  Past Medical History:        Diagnosis  Date         ?  Arthritis  ??     ?  Chronic pain syndrome  ??        ??  related to R hip replacement         ?  COPD (chronic obstructive pulmonary disease) (HCC)  ??        ??  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  DM2 (diabetes mellitus, type 2) (HCC)  ??     ?  GERD (gastroesophageal reflux disease)  ??     ?  Hepatitis C  ??        ??  HEP C         ?  HTN (hypertension)  ??     ?  Lung nodule, multiple  ??        ??  (CT 10/2016) Small left upper lobe and 1.7 x 1.6 cm left lower lobe nodules not significantly changed from 07/23/2016 and 03/22/2016         ?  Marijuana use  ??     ?  PTSD (post-traumatic stress disorder)  ??        ??  lived through the [place] in [place]         ?  Thoracic ascending aortic aneurysm (HCC)  ??        ??  4.4cm noted on CT Chest (10/2016)         ?  Thyroid nodule  ??        ??  2.5 cm stable right thyroid nodule (CT 10/2016)         ??                     Past Surgical History:         Procedure  Laterality  Date          ?  HX ENDOSCOPY  ??  ??     ?  HX HERNIA REPAIR  ??  2019        ??  x2          ?  HX HIP REPLACEMENT  Right  01/29/2015     ?  HX HIP REPLACEMENT  ??  ??     ?  HX HYSTERECTOMY  ??  2010        ??  hysterectomy     ??   ??     Social History     ??  Tobacco Use         ?  Smoking status:  Former Smoker     ?  Smokeless tobacco:  Never Used        ?  Tobacco comment: reported as quit smoking around 1990s per spouse       Substance Use Topics         ?  Alcohol use:  No         ??                     Family History         Problem  Relation  Age of Onset          ?  Hypertension  Mother  ??     ?  Other  Mother  ??        ??      OSA          ?  Cancer  Father  ??        ??      suspected leukemia per pt's spouse          ?  Cancer  Maternal Aunt  ??     ?  Alcohol abuse  Maternal  Grandfather  ??         ??                     Allergies        Allergen  Reactions         ?  Chocolate [Cocoa]  Sneezing         ??  ??  Confirms allergy but reports "I still eat it"         ??     Prior to Admission Medications     Prescriptions  Last Dose  Informant  Patient Reported?  Taking?      LANTUS SOLOSTAR U-100 INSULIN 100 unit/mL (3 mL) inpn  ??  ??  No  Yes      Sig: INJECT 32 UNITS SUBCUTANEOUSLY  NIGHTLY      QUEtiapine SR (SEROQUEL XR) 150 mg sr tablet  ??  ??  Yes  Yes      Sig: Take 150 mg by mouth nightly as needed.      albuterol (ACCUNEB) 1.25 mg/3 mL nebu  ??  ??  No  Yes      Sig: USE 1 VIAL IN NEBULIZER EVERY 6 HOURS AS NEEDED FOR  SHORTNESS  OF  BREATH,  WHEEZING      butalbital-acetaminophen-caffeine (FIORICET, ESGIC) 50-325-40 mg per tablet  ??  ??  No  Yes      Sig: Take 1 Tab by mouth every six (6) hours as needed for Headache. Indications: tension headache      cloNIDine HCl (CATAPRES) 0.1 mg tablet  ??  ??  No  Yes      Sig: Take 3 Tabs by mouth two (2) times a day.      dilTIAZem CD (CARDIZEM CD) 180 mg ER capsule  ??  ??  No  Yes      Sig: Take 1 Cap by mouth daily.      fluticasone propion-salmeterol (ADVAIR DISKUS) 100-50 mcg/dose diskus inhaler  ??  ??  No  Yes      Sig: Take 1 Puff by inhalation two (2) times a day.      gabapentin (NEURONTIN) 300 mg capsule  ??  ??  No  Yes      Sig: Take 1 Cap by mouth three (3) times daily. Max Daily Amount: 900 mg.      losartan (COZAAR) 25 mg tablet  ??  ??  No  Yes      Sig: Take 0.5 Tabs by mouth daily.      magnesium oxide (MAG-OX) 400 mg tablet  ??  ??  Yes  Yes      Sig: Take 400 mg by mouth daily.      metFORMIN ER (GLUCOPHAGE XR) 500 mg tablet  ??  ??  No  Yes      Sig: TAKE 1 TABLET TWICE DAILY      montelukast (SINGULAIR) 10 mg tablet  ??  ??  No  Yes      Sig: TAKE 1 TABLET EVERY DAY      ondansetron (ZOFRAN ODT) 4 mg disintegrating tablet  ??  ??  No  Yes      Sig: Take 1 Tab by mouth every eight (8) hours as needed for Nausea. Indications: nausea       polyethylene glycol (MIRALAX) 17 gram packet  ??  ??  No  Yes      Sig: Take 1 Packet by mouth daily.      Patient taking differently: Take 17 g by mouth daily as needed for Constipation.      rosuvastatin (CRESTOR) 5 mg tablet  ??  ??  No  Yes      Sig: TAKE 1 TABLET BY MOUTH NIGHTLY      spironolactone (ALDACTONE) 100 mg tablet  ??  ??  No  Yes      Sig: TAKE 1 TABLET EVERY DAY      tiotropium bromide (SPIRIVA RESPIMAT) 2.5 mcg/actuation inhaler  ??  ??  No  Yes      Sig: Take 1 Puff by inhalation daily.      traZODone (DESYREL) 50 mg tablet  ??  ??  No  Yes      Sig: Take 1 Tab by mouth nightly as needed for Sleep.               Facility-Administered Medications: None     ??   ??   ??   ??   REVIEW OF SYSTEMS:      [] ? Unable to obtain  ROS due to   [] ?mental status change  [] ?sedated   []  ?intubated    [x] ?Total of 12 systems reviewed as  follows:   Constitutional: negative fever, negative chills, negative weight loss   Eyes:               negative visual changes   ENT:                negative sore throat, tongue or lip swelling   Respiratory:     negative cough, negative dyspnea   Cards:  negative for chest pain, palpitations, lower extremity edema   GI:                   negative for nausea, vomiting, diarrhea, and abdominal pain   Genitourinary: negative for frequency, dysuria   Integument:     negative for rash and pruritus   Hematologic:   negative for easy bruising and gum/nose bleeding   Musculoskel:   negative for myalgias,  back pain and muscle weakness   Neurological:   negative for headaches, dizziness, vertigo   Behavl/Psych: negative for feelings of  anxiety, depression    ??   ??     Objective:     VITALS:     Visit Vitals      BP  106/73 (BP 1 Location: Right arm, BP Patient Position: At rest)        Pulse  (!) 109     Temp  99.1 ??F (37.3 ??C)     Resp  17     Ht  5\' 8"  (1.727 m)     Wt  85.3 kg (188 lb)     SpO2  100%     BMI  28.59 kg/m??     ??   Temp (24hrs), Avg:99.1 ??F (37.3 ??C), Min:99.1 ??F (37.3 ??C), Max:99.1  ??F (37.3 ??C)   ??   ??   PHYSICAL EXAM:    General:          Patient is in severe respiratory distress, now on BiPAP      Head:               Normocephalic, without obvious abnormality, atraumatic.   Eyes:               Conjunctivae clear, anicteric sclerae.  Pupils are equal   Nose:               Nares normal. No drainage or sinus tenderness.   Throat:             Lips, mucosa, and tongue normal.  No Thrush   Neck:               Supple, symmetrical,  no adenopathy, thyroid: non tender                           no carotid bruit and no JVD.   Back:               Symmetric,  No CVA tenderness.   Lungs:             Bilateral expiratory wheezing, decreased breath sound at the base   Chest wall:      Tachypneic, using accessory muscles of respiration.,  Currently she is on BiPAP   Heart:              Regular rate and rhythm,  no murmur, rub or gallop.   Abdomen:        Soft, non-tender. Not distended.  Bowel sounds normal. No masses   Extremities:     Extremities normal, atraumatic, No cyanosis.  No edema. No clubbing   Skin:                Texture, turgor normal. No rashes or lesions.  Not Jaundiced   Psych:             Extremely anxious.   Neurologic:      EOMs intact. No facial asymmetry. No aphasia or slurred speech. Normal  strength, Alert and oriented X 3."   #############      ACUTE DIAGNOSES:   Respiratory failure with hypoxia (HCC) [J96.91]      CHRONIC MEDICAL DIAGNOSES:      Problem List as of 09/07/2018  Date Reviewed:  09-07-2018                        Codes  Class  Noted - Resolved             Respiratory failure  with hypoxia [facility](HCC)  ICD-10-CM: J96.91   ICD-9-CM: 518.81    08/22/2018 - Present                       Headache  ICD-10-CM: R51   ICD-9-CM: 784.0    08/05/2018 - Present                       Abdominal pain  ICD-10-CM: R10.9   ICD-9-CM: 789.00    02/04/2018 - Present                       SIRS (systemic inflammatory response syndrome) (HCC)  ICD-10-CM: R65.10   ICD-9-CM: 995.90    10/09/2017 - Present                        Hyperkalemia  ICD-10-CM: E87.5   ICD-9-CM: 276.7    09/08/2017 - Present                       Obtunded  ICD-10-CM: R40.1   ICD-9-CM: 780.09    09/08/2017 - Present                       Acute renal failure (ARF) (HCC)  ICD-10-CM: N17.9   ICD-9-CM: 584.9    09/08/2017 - Present                       Septic shock (HCC)  ICD-10-CM: A41.9, R65.21   ICD-9-CM: 038.9, 785.52, 995.92    09/08/2017 - Present                       Metabolic encephalopathy  ICD-10-CM: G93.41   ICD-9-CM: 348.31    09/08/2017 - Present                       Dehydration  ICD-10-CM: E86.0   ICD-9-CM: 276.51    08/07/2017 - Present                       COPD with acute exacerbation (HCC)  ICD-10-CM: J44.1   ICD-9-CM: 491.21    08/07/2017 - Present                       Acute-on-chronic kidney injury (HCC)  ICD-10-CM: N17.9, N18.9   ICD-9-CM: 584.9, 585.9    08/07/2017 - Present                       Chest pain  ICD-10-CM: R07.9   ICD-9-CM: 786.50    06/15/2017 - Present                       Dyspnea  ICD-10-CM: R06.00   ICD-9-CM: 786.09    06/15/2017 - Present                       Type 2 diabetes mellitus with diabetic neuropathy (HCC)  ICD-10-CM: E11.40   ICD-9-CM: 250.60, 357.2    12/07/2016 - Present                       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   ICD-9-CM: 569.89    08/17/2016 - Present  Cannabinoid hyperemesis syndrome (HCC)  ICD-10-CM: F12.988   ICD-9-CM: 536.2, 305.20    08/17/2016 - Present                       Gastritis  ICD-10-CM: K29.70   ICD-9-CM: 535.50    08/16/2016 - Present                       Nausea & vomiting  ICD-10-CM: R11.2   ICD-9-CM: 787.01    08/16/2016 - Present                       Asthma with acute exacerbation  ICD-10-CM: J45.901   ICD-9-CM: 493.92    08/04/2016 - Present                       Lactic acidosis  ICD-10-CM: E87.2   ICD-9-CM: 276.2    07/24/2016 - Present                       Leukocytosis  ICD-10-CM: D72.829   ICD-9-CM: 288.60    07/24/2016 - Present                        Sepsis (HCC)  ICD-10-CM: A41.9   ICD-9-CM: 038.9, 995.91    07/24/2016 - Present                       Asthma exacerbation  ICD-10-CM: J45.901   ICD-9-CM: 493.92    07/24/2016 - Present                       Type 2 diabetes mellitus with nephropathy (HCC)  ICD-10-CM: E11.21   ICD-9-CM: 250.40, 583.81    06/12/2016 - Present                       Sacroiliitis (HCC)  ICD-10-CM: M46.1   ICD-9-CM: 720.2    11/25/2015 - Present                       Essential hypertension  ICD-10-CM: I10   ICD-9-CM: 401.9    10/06/2015 - Present                       Spondylosis of lumbar region without myelopathy or radiculopathy  ICD-10-CM: M47.816   ICD-9-CM: 721.3    09/15/2015 - Present                       Lumbar and sacral osteoarthritis  ICD-10-CM: M47.817   ICD-9-CM: 721.3    09/15/2015 - Present                       Chronic pain syndrome  ICD-10-CM: G89.4   ICD-9-CM: 338.4    09/15/2015 - Present                       Type 2 diabetes mellitus with hyperglycemia, without long-term current use of insulin (HCC)  ICD-10-CM: E11.65   ICD-9-CM: 250.00, 790.29    08/20/2015 - Present                       Acute colitis  ICD-10-CM: K52.9   ICD-9-CM: 558.9  07/02/2015 - Present                       Acute hyperglycemia  ICD-10-CM: R73.9   ICD-9-CM: 790.29    07/02/2015 - Present                       Accelerated hypertension  ICD-10-CM: I10   ICD-9-CM: 401.0    04/08/2015 - Present                       Severe headache  ICD-10-CM: R51   ICD-9-CM: 784.0    04/07/2015 - Present                       Osteoarthritis of hips, bilateral  ICD-10-CM: M16.0   ICD-9-CM: 715.95    01/29/2015 - Present                       PTSD (post-traumatic stress disorder) (Chronic)  ICD-10-CM: F43.10   ICD-9-CM: 309.81    01/29/2015 - Present                       Severe hypertension (Chronic)  ICD-10-CM: I10   ICD-9-CM: 401.9    07/21/2014 - Present                       RESOLVED: COPD exacerbation (HCC)  ICD-10-CM: J44.1   ICD-9-CM: 491.21    11/14/2017 - 08/08/2018                        RESOLVED: New onset type 1 diabetes mellitus, uncontrolled (HCC)  ICD-10-CM: E10.65   ICD-9-CM: 250.03    07/02/2015 - 10/06/2015                       RESOLVED: Recurrent major depressive disorder, in full remission (HCC)  ICD-10-CM: F33.42   ICD-9-CM: 296.36    12/02/2014 - 12/02/2014                       RESOLVED: Foreign body in colon  ICD-10-CM: T18.4XXA   ICD-9-CM: 936    08/14/2014 - 01/29/2015                       RESOLVED: Advanced care planning/counseling discussion  ICD-10-CM: Z71.89   ICD-9-CM: V65.49    07/21/2014 - 01/29/2015          Overview Signed 07/21/2014  3:46 PM by Littie Deeds            Patient given [place] application                                      RESOLVED: Hip pain, chronic  ICD-10-CM: M25.559, G89.29   ICD-9-CM: 719.45, 338.29    07/21/2014 - 01/29/2015                               Data Review:          Recent Days:   No results for input(s): WBC, HGB, HCT, PLT, HGBEXT, HCTEXT, PLTEXT in the last 72 hours.     Recent Labs  09/06/18   0623  09/05/18   0612     NA  138  136     K  5.1  4.4     CL  103  100     CO2  27  30     GLU  139*  156*     BUN  31*  29*     CREA  0.8  0.8     CA  8.6  8.6     PHOS  3.7  4.1         ALB  2.6*  2.4*        No results for input(s): PH, PCO2, PO2, HCO3, FIO2 in the last 72 hours.      24 Hour Results:     Recent Results (from the past 24 hour(s))     GLUCOSE, POC          Collection Time: 09/06/18 12:06 PM         Result  Value  Ref Range            Glucose (POC)  128 (H)  65 - 105 mg/dL       GLUCOSE, POC          Collection Time: 09/06/18  5:09 PM         Result  Value  Ref Range            Glucose (POC)  147 (H)  65 - 105 mg/dL       GLUCOSE, POC          Collection Time: 09/06/18 10:04 PM         Result  Value  Ref Range            Glucose (POC)  115 (H)  65 - 105 mg/dL       GLUCOSE, POC          Collection Time: 09/07/18  7:56 AM         Result  Value  Ref Range            Glucose (POC)  123 (H)  65 - 105 mg/dL            Xr Chest Sngl V      Result Date: 09/04/2018   Clinical history: Chest pressure, chest tube removal EXAMINATION: Single view of the chest 09/04/2018 Correlation: Chest radiograph 09/03/2018 and CT scan 08/30/2018 FINDINGS: Interval removal of left chest tube. Small left apical pneumothorax. Left perihilar  opacity corresponds to atelectasis on CT scan. 1.7 cm left lower lobe nodule. Right lung is clear.       IMPRESSION: 1. Removal of right chest tube, small right apical pneumothorax. 2. Left perihilar opacity corresponding to atelectasis. 3. 1.7 cm left lower lobe nodule.       Xr Chest Sngl V      Result Date: 09/03/2018   Exam: AP portable chest Clinical indication: Dyspnea Comparison: September 01, 2018 Results:  No consolidation.  No pleural effusions.    17 mm nodule left mid chest. Bullous disease left upper lobe. Persistent left upper lobe pneumothorax, unchanged. Left  mid chest opacity. Unchanged. Heart normal.  Mediastinal contours are normal. Left chest tube unchanged. No free air is seen under the hemidiaphragms.   Osseous structures intact.       IMPRESSION:   1.  Persistent episode left upper lobe pneumothorax, unchanged. Left lung nodule. Stable bullous disease. 2. Left mid lung opacities likely representing  atelectasis/consolidation, unchanged. 3. Left chest tube unchanged.       Xr Chest Sngl V      Result Date: 09/01/2018   INDICATION: Pneumothorax.    COMPARISON: 08/31/2018 TECHNIQUE: Single view chest.       IMPRESSION: There is left lung pulmonary nodule, unchanged. Subtle left 10% apical pneumothorax, relatively unchanged. There is left-sided chest tube. Patchy infiltrates in the right lower lung zone, slightly increased since prior study.       Xr Chest Sngl V      Result Date: 08/31/2018   INDICATION: Pneumothorax   EXAMINATION: XR CHEST SNGL V COMPARISON: 08/30/2018 FINDINGS: The study shows a normal sized heart.. Partially 10 and 15% persistent left pneumothorax. 18 mm left lung nodule.  Indwelling left chest tube.Marland Kitchen          IMPRESSION: 1. 18 mm left lung nodule. 2. 15% left pneumothorax. 3. Indwelling left chest tube.       Xr Chest Sngl V      Result Date: 08/30/2018   INDICATION: Pneumothorax   EXAMINATION: XR CHEST SNGL V COMPARISON: 08/29/2018 FINDINGS: The study shows a normal sized heart.. Proximally 10-15% residual left apical pneumothorax.. Indwelling left chest tube. Left lung mass..          IMPRESSION: 1. Similar 10-15% left pneumothorax. 2. Indwelling left chest tube. 3. Left lung mass.       Xr Chest Sngl V      Result Date: 08/29/2018   EXAMINATION: XR CHEST SNGL V CLINICAL INDICATION: Pneumothorax     COMPARISON:  August 28, 2018 TECHNIQUE: AP portable chest FINDINGS: 15 mm nodule left mid chest. Chest tube left mid to lower lung. Tip now directed medially. Marked improvement of the  left pneumothorax. Left apical lines medially and a line to the left of the aortic arch suggest residual pneumothorax. Lucency in left mid chest may be due to a superimposed loculated pneumothorax or bulla. Mild right basilar infiltrate. Mild cardiomegaly.       IMPRESSION:  1. Marked reduction in the left-sided pneumothorax. Likely less than 15%-20% 2. Left lung nodule. 3. Left chest tube with tip now directed medially       Xr Chest Sngl V      Result Date: 08/28/2018   Clinical history: Chest tube placement EXAMINATION: Single view of the chest 08/28/2018 Correlation: CT scan 08/28/2018 FINDINGS: Trachea and heart size are within normal limits. Right lung is clear. There are left lung nodules. Placement of left chest  tube. Small left apical lateral pneumothorax.       IMPRESSION: 1. Placement of left chest tube, left apical lateral pneumothorax. 2. Left lung nodules.       Xr Chest Sngl V      Result Date: 08/28/2018   EXAM:  Chest AP INDICATIONS: sob, COMPARISON: 08/22/2018. FINDINGS: New hyperlucency of the left upper lung zone with a triangular opacity in the left midlung zone. Findings are concerning for  pneumothorax with associated partial atelectasis/collapse of  the upper lobe. Stable nodular density in the left lower lung zone. The right lung is clear. Normal cardiomediastinal contours.       IMPRESSION: New large left pneumothorax. Results discussed with Dr. Laveda Norman on 08/28/2018 at 11:40 AM.       Xr Chest Sngl V      Result Date: 08/22/2018   Clinical history: Shortness of breath EXAMINATION: Single view of the chest 08/22/2018 Correlation: Chest radiograph 08/04/2018 and CTA chest 02/18/2018 FINDINGS: Trachea and heart size  are within normal limits. Scarring/atelectasis left mid to lower lung.  2 cm left lower lobe nodule unchanged from chest radiograph of 08/04/2018 and CTA chest 02/18/2018. Right lung is clear.       IMPRESSION: 1. No acute pulmonary process. 2. 2 cm stable nodule left lower lobe unchanged from 08/04/2018 and evident on CT scan 02/18/2018.       Ct Chest Wo Cont      Result Date: 08/30/2018   Indication: Apical pneumothorax versus blebs following chest tube insertion.       IMPRESSION: Large biapical subpleural blebs and bullae. No pneumothorax. 17 mm left lung nodule. Comment: CT images of the chest were obtained without intravenous contrast. This was compared with all recent studies. A 9 French left chest tube is in place.  There is no pneumothorax. Subpleural blebs and bullae are present in both lungs particularly in the upper lobes creating the lines simulating left pneumothorax on plain films. A 17 mm left lower lobe nodule. Atelectatic consolidation is present in the  left upper lobe. There is minimal subsegmental atelectasis at the right lung base. The heart is of normal size. Aorta is of normal caliber. Focal calcification present in the left breast. These findings were discussed with Javier Glazier, PA.Marland Kitchen DICOM format  imaged data is available to non-affiliated external healthcare facilities or entities on a secure, media free, reciprocally searchable basis with patient authorization  for 12  months following the date of the study.       Ct Guide Insrt Chest Tube      Result Date: 08/28/2018   Indication: Spontaneous left pneumothorax.       IMPRESSION: Using aseptic technique under CT guidance 9 French left chest tube was placed and connected to continuous low pressure suction. The pneumothorax was evacuated. The catheter was secured in place. There were no complications and the patient  left the radiology department in satisfactory condition. 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 Fentanyl sedation was utilized by the radiology nurse. Start time 1444 and end time 1504. DICOM format imaged data is available to non-affiliated external  healthcare facilities or entities on a secure, media free, reciprocally searchable basis with patient authorization  for 12 months following the date of the study.          Microbiology:     All Micro Results               Procedure  Component  Value  Units  Date/Time           CULTURE, RESPIRATORY/SPUTUM/BRONCH Gay Filler STAIN [[id number]]  (Abnormal)  Collected:  08/24/18 1554            Order Status:  Completed  Specimen:  Respiratory specimen from Sputum  Updated:  08/27/18 1457               GRAM STAIN                 Rare Gram Positive Cocci In Clusters   Rare Gram Positive Bacilli   Few Gram Positive Cocci In Chains   <10 Epithelial cells/lpf   10 - 25 WBC's/lpf   Mucus Present                    Culture result                 Moderate Commensal Flora Isolated  Cardiology:     Results for orders placed or performed during the hospital encounter of 08/22/18     EKG, 12 LEAD, INITIAL         Result  Value  Ref Range            Ventricular Rate  120  BPM       Atrial Rate  120  BPM       P-R Interval  144  ms       QRS Duration  72  ms       Q-T Interval  298  ms       QTC Calculation (Bezet)  421  ms       Calculated P Axis  72  degrees       Calculated R Axis  70  degrees       Calculated T Axis  66  degrees       Diagnosis                  Sinus tachycardia   Otherwise normal ECG   When compared with ECG of 04-Aug-2018 23:00,   premature supraventricular complexes are no longer present   Nonspecific T wave abnormality no longer evident in Inferior leads   Nonspecific T wave abnormality no longer evident in Lateral leads   Confirmed by Yetta Barre, M.D., John P [phone]) on 08/28/2018 4:43:46 PM                 Problem List:      Problem List as of 09/07/2018  Date Reviewed:  Aug 27, 2018                        Codes  Class  Noted - Resolved             Respiratory failure with hypoxia [facility])  ICD-10-CM: J96.91   ICD-9-CM: 518.81    08/22/2018 - Present                       Headache  ICD-10-CM: R51   ICD-9-CM: 784.0    08/05/2018 - Present                       Abdominal pain  ICD-10-CM: R10.9   ICD-9-CM: 789.00    02/04/2018 - Present                       SIRS (systemic inflammatory response syndrome) (HCC)  ICD-10-CM: R65.10   ICD-9-CM: 995.90    10/09/2017 - Present                       Hyperkalemia  ICD-10-CM: E87.5   ICD-9-CM: 276.7    09/08/2017 - Present                       Obtunded  ICD-10-CM: R40.1   ICD-9-CM: 780.09    09/08/2017 - Present                       Acute renal failure (ARF) (HCC)  ICD-10-CM: N17.9   ICD-9-CM: 584.9    09/08/2017 - Present                       Septic shock (HCC)  ICD-10-CM: A41.9, R65.21   ICD-9-CM: 038.9, 785.52, 995.92    09/08/2017 -  Present                       Metabolic encephalopathy  ICD-10-CM: G93.41   ICD-9-CM: 348.31    09/08/2017 - Present                       Dehydration  ICD-10-CM: E86.0   ICD-9-CM: 276.51    08/07/2017 - Present                       COPD with acute exacerbation (HCC)  ICD-10-CM: J44.1   ICD-9-CM: 491.21    08/07/2017 - Present                       Acute-on-chronic kidney injury (HCC)  ICD-10-CM: N17.9, N18.9   ICD-9-CM: 584.9, 585.9    08/07/2017 - Present                       Chest pain  ICD-10-CM: R07.9   ICD-9-CM: 786.50    06/15/2017 - Present                       Dyspnea  ICD-10-CM: R06.00    ICD-9-CM: 786.09    06/15/2017 - Present                       Type 2 diabetes mellitus with diabetic neuropathy (HCC)  ICD-10-CM: E11.40   ICD-9-CM: 250.60, 357.2    12/07/2016 - Present                       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   ICD-9-CM: 569.89    08/17/2016 - Present                       Cannabinoid hyperemesis syndrome (HCC)  ICD-10-CM: F12.988   ICD-9-CM: 536.2, 305.20    08/17/2016 - Present                       Gastritis  ICD-10-CM: K29.70   ICD-9-CM: 535.50    08/16/2016 - Present                       Nausea & vomiting  ICD-10-CM: R11.2   ICD-9-CM: 787.01    08/16/2016 - Present                       Asthma with acute exacerbation  ICD-10-CM: J45.901   ICD-9-CM: 493.92    08/04/2016 - Present                       Lactic acidosis  ICD-10-CM: E87.2   ICD-9-CM: 276.2    07/24/2016 - Present                       Leukocytosis  ICD-10-CM: D72.829   ICD-9-CM: 288.60    07/24/2016 - Present                       Sepsis (HCC)  ICD-10-CM: A41.9   ICD-9-CM: 038.9, 995.91    07/24/2016 - Present                       Asthma exacerbation  ICD-10-CM: J45.901   ICD-9-CM: 493.92    07/24/2016 - Present                       Type 2 diabetes mellitus with nephropathy (HCC)  ICD-10-CM: E11.21   ICD-9-CM: 250.40, 583.81    06/12/2016 - Present                       Sacroiliitis (HCC)  ICD-10-CM: M46.1   ICD-9-CM: 720.2    11/25/2015 - Present                       Essential hypertension  ICD-10-CM: I10   ICD-9-CM: 401.9    10/06/2015 - Present                       Spondylosis of lumbar region without myelopathy or radiculopathy  ICD-10-CM: M47.816   ICD-9-CM: 721.3    09/15/2015 - Present                       Lumbar and sacral osteoarthritis  ICD-10-CM: M47.817   ICD-9-CM: 721.3    09/15/2015 - Present                       Chronic pain syndrome  ICD-10-CM: G89.4   ICD-9-CM: 338.4    09/15/2015 - Present                       Type 2 diabetes mellitus with hyperglycemia, without long-term current use of insulin (HCC)   ICD-10-CM: E11.65   ICD-9-CM: 250.00, 790.29    08/20/2015 - Present                       Acute colitis  ICD-10-CM: K52.9   ICD-9-CM: 558.9    07/02/2015 - Present                       Acute hyperglycemia  ICD-10-CM: R73.9   ICD-9-CM: 790.29    07/02/2015 - Present                       Accelerated hypertension  ICD-10-CM: I10   ICD-9-CM: 401.0    04/08/2015 - Present                       Severe headache  ICD-10-CM: R51   ICD-9-CM: 784.0    04/07/2015 - Present                       Osteoarthritis of hips, bilateral  ICD-10-CM: M16.0   ICD-9-CM: 715.95    01/29/2015 - Present                       PTSD (post-traumatic stress disorder) (Chronic)  ICD-10-CM: F43.10   ICD-9-CM: 309.81    01/29/2015 - Present                       Severe hypertension (Chronic)  ICD-10-CM: I10   ICD-9-CM: 401.9    07/21/2014 - Present                       RESOLVED: COPD exacerbation (HCC)  ICD-10-CM: J44.1   ICD-9-CM: 491.21  11/14/2017 - 08/08/2018                       RESOLVED: New onset type 1 diabetes mellitus, uncontrolled (HCC)  ICD-10-CM: E10.65   ICD-9-CM: 250.03    07/02/2015 - 10/06/2015                       RESOLVED: Recurrent major depressive disorder, in full remission (HCC)  ICD-10-CM: F33.42   ICD-9-CM: 296.36    12/02/2014 - 12/02/2014                       RESOLVED: Foreign body in colon  ICD-10-CM: T18.4XXA   ICD-9-CM: 936    08/14/2014 - 01/29/2015                       RESOLVED: Advanced care planning/counseling discussion  ICD-10-CM: O84.16   ICD-9-CM: V65.49    07/21/2014 - 01/29/2015          Overview Signed 07/21/2014  3:46 PM by Littie Deeds            Patient given [place] application                                      RESOLVED: Hip pain, chronic  ICD-10-CM: M25.559, G89.29   ICD-9-CM: 719.45, 338.29    07/21/2014 - 01/29/2015                          Signed:   [name], MD   09/07/2018

## 2018-09-07 NOTE — Progress Notes (Signed)
Transport to : HOME  Reason : HOSPITAL D/C  transport set up with : [employer] Mickie Kay)  Time/date 09/07/2018 @ AFTER 11:30AM  Dc summary loaded N/A  nurse/cm notified   YES  envelope delivered  NO  Insurance verified on face sheet  YES  auth needed   YES  auth # [id number]

## 2018-09-07 NOTE — Progress Notes (Signed)
Bedside shift change report given to brenda Rn (oncoming nurse) by Pat Kocher (offgoing nurse). Report included the following information SBAR.

## 2018-09-07 NOTE — Progress Notes (Signed)
Discharge Plan:   Home with husband and MD follow up. Pt and her spouse both declined HH care and declined SNF due to Covid 19 pandemic. Per husband she needs [employer] ride home. Confirmed home address and he is there waiting for her. Dc instructions per MD and dc RN to pt/ see epic for follow up care as noted.    Discharge Date:     09/07/2018       Is patient going to snf? No she and spouse declined      Home Health Needed:     Offered in lieu of declining SNF. They also declined HH    Home Health Agency:    n/a    FOC given : yes    Confirmed start of care with the home health agency and spoke with:    n/a    DME needed and ordered for Discharge:   none        Transportation:  [employer] cab asked DCPA to set up    Transport Time:    1130 or later per discussion with RN Patsi Sears, MD, patient, nurse  aware of pickup time    Husband aware of dc will call with time once known. RN Mila aware of dc today. Husband is B Goddwin [phone] and cell if needed is [phone]/  Called him to inform of dc he is aware. She will leave today.he is aware after 1130 he asked to have wife call when they are here to get her.   Follow-Up       Follow-up With  Details  Why  Contact Info   [name], PA-C  Call in 1 week  for follow up appointment: unable to schedule appointment @ this time; advise patient to call own appointment**  [address]  [address]  [place] [address]  [phone]      Ms Enix is aware to call her husband when cab arrives to get her so he will know about when she will be home.

## 2018-11-15 DIAGNOSIS — R5383 Other fatigue: Secondary | ICD-10-CM

## 2018-11-16 ENCOUNTER — Inpatient Hospital Stay: Admit: 2018-11-16 | Payer: MEDICARE | Primary: Physician Assistant

## 2018-11-16 DIAGNOSIS — R8279 Other abnormal findings on microbiological examination of urine: Secondary | ICD-10-CM

## 2018-11-16 LAB — BASIC METABOLIC PANEL
Anion Gap: 8 mmol/L (ref 5–15)
BUN: 25 mg/dl (ref 7–25)
CO2: 26 mEq/L (ref 21–32)
Calcium: 9.4 mg/dl (ref 8.5–10.1)
Chloride: 104 mEq/L (ref 98–107)
Creatinine: 1.1 mg/dl (ref 0.6–1.3)
EGFR IF NonAfrican American: 53
GFR African American: >60
Glucose: 96 mg/dl (ref 74–106)
Potassium: 4 mEq/L (ref 3.5–5.1)
Sodium: 138 mEq/L (ref 136–145)

## 2018-11-16 LAB — CBC WITH AUTO DIFFERENTIAL
Basophils %: 0.5 % (ref 0–3)
Eosinophils %: 1.5 % (ref 0–5)
Hematocrit: 42.7 % (ref 37.0–50.0)
Hemoglobin: 13.8 gm/dl (ref 13.0–17.2)
Immature Granulocytes: 0.3 % (ref 0.0–3.0)
Lymphocytes %: 28.2 % (ref 28–48)
MCH: 28.9 pg (ref 25.4–34.6)
MCHC: 32.3 gm/dl (ref 30.0–36.0)
MCV: 89.5 fL (ref 80.0–98.0)
MPV: 11.7 fL — ABNORMAL HIGH (ref 6.0–10.0)
Monocytes %: 8.2 % (ref 1–13)
Neutrophils %: 61.3 % (ref 34–64)
Nucleated RBCs: 0 (ref 0–0)
Platelets: 235 10*3/uL (ref 140–450)
RBC: 4.77 M/uL (ref 3.60–5.20)
RDW-SD: 43.4 (ref 36.4–46.3)
WBC: 10.9 10*3/uL (ref 4.0–11.0)

## 2018-11-16 LAB — VITAMIN B12
VITAMIN B12: 503 pg/ml (ref 193–986)
Vitamin B12: 503 pg/ml (ref 193–986)

## 2018-11-16 LAB — TSH 3RD GENERATION
TSH: 1.7 u[IU]/mL (ref 0.358–3.740)
TSH: 1.7 u[IU]/mL (ref 0.358–3.740)

## 2018-11-16 LAB — T4, FREE
FREE T4, FT4T: 1.26 ng/dl (ref 0.76–1.46)
Free T4: 1.26 ng/dl (ref 0.76–1.46)

## 2018-11-16 LAB — VITAMIN D 25 HYDROXY: Vitamin D, 25-OH, Total: 14.5 ng/ml — ABNORMAL LOW (ref 30.0–100.0)

## 2018-11-16 LAB — METABOLIC PANEL, BASIC
Anion gap: 8 mmol/L (ref 5–15)
BUN: 25 mg/dl (ref 7–25)
CO2: 26 mEq/L (ref 21–32)
Calcium: 9.4 mg/dl (ref 8.5–10.1)
Chloride: 104 mEq/L (ref 98–107)
Creatinine: 1.1 mg/dl (ref 0.6–1.3)
GFR est AA: >60
GFR est non-AA: 53
Glucose: 96 mg/dl (ref 74–106)
Potassium: 4 mEq/L (ref 3.5–5.1)
Sodium: 138 mEq/L (ref 136–145)

## 2018-11-16 LAB — CBC WITH AUTOMATED DIFF
BASOPHILS: 0.5 % (ref 0–3)
EOSINOPHILS: 1.5 % (ref 0–5)
HCT: 42.7 % (ref 37.0–50.0)
HGB: 13.8 gm/dl (ref 13.0–17.2)
IMMATURE GRANULOCYTES: 0.3 % (ref 0.0–3.0)
LYMPHOCYTES: 28.2 % (ref 28–48)
MCH: 28.9 pg (ref 25.4–34.6)
MCHC: 32.3 gm/dl (ref 30.0–36.0)
MCV: 89.5 fL (ref 80.0–98.0)
MONOCYTES: 8.2 % (ref 1–13)
MPV: 11.7 fL — ABNORMAL HIGH (ref 6.0–10.0)
NEUTROPHILS: 61.3 % (ref 34–64)
NRBC: 0 (ref 0–0)
PLATELET: 235 10*3/uL (ref 140–450)
RBC: 4.77 M/uL (ref 3.60–5.20)
RDW-SD: 43.4 (ref 36.4–46.3)
WBC: 10.9 10*3/uL (ref 4.0–11.0)

## 2018-11-16 LAB — VITAMIN D, 25 HYDROXY: Vitamin D, 25-OH, Total: 14.5 ng/ml — ABNORMAL LOW (ref 30.0–100.0)

## 2018-11-18 LAB — CULTURE, URINE
CULTURE RESULT: >100000 — AB
Culture result: >100000 — AB
Isolate: >100000 — AB
Isolate: >100000 — AB

## 2018-11-21 DIAGNOSIS — N390 Urinary tract infection, site not specified: Secondary | ICD-10-CM

## 2018-11-22 ENCOUNTER — Inpatient Hospital Stay: Admit: 2018-11-22 | Payer: MEDICARE | Primary: Physician Assistant

## 2018-11-23 LAB — CULTURE, URINE
CULTURE RESULT: >100000 — AB
Culture result: >100000 — AB

## 2019-01-16 ENCOUNTER — Ambulatory Visit: Attending: Urology | Primary: Physician Assistant

## 2019-01-16 ENCOUNTER — Ambulatory Visit
Admit: 2019-01-16 | Discharge: 2019-01-16 | Payer: PRIVATE HEALTH INSURANCE | Attending: Urology | Primary: Physician Assistant

## 2019-01-16 DIAGNOSIS — N390 Urinary tract infection, site not specified: Secondary | ICD-10-CM

## 2019-01-16 LAB — AMB POC PVR, MEAS,POST-VOID RES,US,NON-IMAGING
PVR POC: 0 cc
PVR: 0 cc

## 2019-01-16 NOTE — Progress Notes (Signed)
01/16/2019    [name] FantasiaMonica [name]  09/07/1950             ICD-10-CM ICD-9-CM    1. Recurrent UTI  N39.0 599.0 AMB POC PVR, MEAS,POST-VOID RES,US,NON-IMAGING      ASSESSMENT:    ?Recurrent UTI   Clean catch cultures available suggestive of skin flora contamination    Suprapubic and vulvar skin lesions    History of hysterectomy w/ BSO    PLAN:  - Increase fluid intake  - Ensure healthy bowel habits with regular BM daily  - Start D Mannose and Cranberry supplements  - RTC in 2 - 3 months, consider vaginal estrogen if not improvement  - recommend dermatology for recurrent skin abscesses   - Ucx today, call w/ results   - RTC if symptoms, recommend a cath culture.     DISCUSSION:      Chief Complaint   Patient presents with   ??? Recurrent UTI     Pt ref here by her PCP ,Pt states she gets uit's once a month,Pt states she will finish a antibiotic and once done uti will come back       HISTORY OF PRESENT ILLNESS:  [name] [name] is a 68 y.o. female who presents today for ?UTI, referred by [name] [name], Monique, PA-C. Hx HTN, COPD, Hep C, DM, thoracic AAA, PTSD.     All cultures available suggest contamination.       Has history of hysterectomy (says ovaries were removed as well) in 2010 for bleeding. Has not seen GYN since then.        Review of Systems  Constitutional: Fever: No  Skin: Rash: No  HEENT: Hearing difficulty: No  Eyes: Blurred vision: No  Cardiovascular: Chest pain: No  Respiratory: Shortness of breath: Yes  Gastrointestinal: Nausea/vomiting: No  Musculoskeletal: Back pain: No  Neurological: Weakness: No  Psychological: Memory loss: No  Comments/additional findings:     Past Medical History:   Diagnosis Date   ??? Arthritis    ??? 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 DM2 (diabetes mellitus, type 2) (HCC)    ??? GERD (gastroesophageal reflux disease)    ??? Hepatitis C     HEP C   ??? HTN (hypertension)    ??? Lung nodule,  multiple     (CT 10/2016) Small left upper lobe and 1.7 x 1.6 cm left lower lobe nodules not significantly changed from 07/23/2016 and 03/22/2016   ??? Marijuana use    ??? PTSD (post-traumatic stress disorder)     [place]   ??? Thoracic ascending aortic aneurysm (HCC)     4.4cm noted on CT Chest (10/2016)   ??? Thyroid nodule     2.5 cm stable right thyroid nodule (CT 10/2016)       Past Surgical History:   Procedure Laterality Date   ??? HX ENDOSCOPY     ??? HX HERNIA REPAIR  2019    x2   ??? HX HIP REPLACEMENT Right 01/29/2015   ??? HX HIP REPLACEMENT     ??? HX HYSTERECTOMY  2010    hysterectomy       Social History     Tobacco Use   ??? Smoking status: Former Smoker   ??? Smokeless tobacco: Never Used   ??? Tobacco comment: reported as quit  smoking around 1990s per spouse   Substance Use Topics   ??? Alcohol use: No   ??? Drug use: Yes     Types: Marijuana       Allergies   Allergen Reactions   ??? Chocolate [Cocoa] Sneezing     Confirms allergy but reports "I still eat it"       Family History   Problem Relation Age of Onset   ??? Hypertension Mother    ??? Other Mother         OSA   ??? Cancer Father         suspected leukemia per pt's spouse   ??? Cancer Maternal Aunt    ??? Alcohol abuse Maternal Grandfather        Current Outpatient Medications   Medication Sig Dispense Refill   ??? dilTIAZem CD (CARDIZEM CD) 240 mg ER capsule Take 1 Cap by mouth daily. 30 Cap 1   ??? losartan (COZAAR) 50 mg tablet Take 1 Tab by mouth daily. 30 Tab 1   ??? cetirizine (ZYRTEC) 10 mg tablet Take 1 Tab by mouth daily. 30 Tab 1   ??? fluticasone propionate (FLONASE) 50 mcg/actuation nasal spray 2 Sprays by Both Nostrils route daily. 1 Bottle 1   ??? LANTUS SOLOSTAR U-100 INSULIN 100 unit/mL (3 mL) inpn INJECT 32 UNITS SUBCUTANEOUSLY  NIGHTLY 15 mL 0   ??? albuterol (ACCUNEB) 1.25 mg/3 mL nebu USE 1 VIAL IN NEBULIZER EVERY 6 HOURS AS NEEDED FOR  SHORTNESS  OF  BREATH,  WHEEZING 270 mL 1   ??? cloNIDine HCl (CATAPRES) 0.1 mg tablet Take 3  Tabs by mouth two (2) times a day. 60 Tab 1   ??? spironolactone (ALDACTONE) 100 mg tablet TAKE 1 TABLET EVERY DAY 90 Tab 0   ??? metFORMIN ER (GLUCOPHAGE XR) 500 mg tablet TAKE 1 TABLET TWICE DAILY 180 Tab 1   ??? montelukast (SINGULAIR) 10 mg tablet TAKE 1 TABLET EVERY DAY 90 Tab 3   ??? gabapentin (NEURONTIN) 300 mg capsule Take 1 Cap by mouth three (3) times daily. Max Daily Amount: 900 mg. 270 Cap 1   ??? polyethylene glycol (MIRALAX) 17 gram packet Take 1 Packet by mouth daily. (Patient taking differently: Take 17 g by mouth daily as needed for Constipation.) 30 Packet 1   ??? magnesium oxide (MAG-OX) 400 mg tablet Take 400 mg by mouth daily.     ??? QUEtiapine SR (SEROQUEL XR) 150 mg sr tablet Take 150 mg by mouth nightly as needed.     ??? ondansetron (ZOFRAN ODT) 4 mg disintegrating tablet Take 1 Tab by mouth every eight (8) hours as needed for Nausea. Indications: nausea 12 Tab 0   ??? tiotropium bromide (SPIRIVA RESPIMAT) 2.5 mcg/actuation inhaler Take 1 Puff by inhalation daily. 1 Inhaler 0   ??? fluticasone propion-salmeterol (ADVAIR DISKUS) 100-50 mcg/dose diskus inhaler Take 1 Puff by inhalation two (2) times a day. 1 Inhaler 0   ??? traZODone (DESYREL) 50 mg tablet Take 1 Tab by mouth nightly as needed for Sleep. 20 Tab 0   ??? rosuvastatin (CRESTOR) 5 mg tablet TAKE 1 TABLET BY MOUTH NIGHTLY 90 Tab 1   ??? guaiFENesin ER (MUCINEX) 600 mg ER tablet Take 1 Tab by mouth every twelve (12) hours. 20 Tab 0         PHYSICAL EXAMINATION:     Visit Vitals  Ht 5\' 8"  (1.727 m)   Wt 185 lb (83.9 kg)   BMI 28.13 kg/m??  Constitutional: Well developed, well-nourished female in no acute distress.   CV:  No peripheral swelling noted. RRR radial pulse.  Respiratory: No respiratory distress or difficulties  Abdomen:  Soft and nontender. No masses. No hepatosplenomegaly.   GU Female:  Mild vaginal atrophy.  No leak with cough,  Mild urethral hypermobility. Suprapubic superficial wound with granulation tissue, 1 cm in size, non painful, no  erythema, warmth or drainage.    Skin:  Normal color. No evidence of jaundice.     Neuro/Psych:  Patient with appropriate affect.  Alert and oriented.    Lymphatic:   No enlargement of supraclavicular lymph nodes.      REVIEW OF LABS AND IMAGING:      Results for orders placed or performed in visit on 01/16/19   AMB POC PVR, MEAS,POST-VOID RES,US,NON-IMAGING   Result Value Ref Range    PVR 0 cc       Urine Culture  11/21/18 - 100,000 CFU/mL Multiple microorganisms present; probable contamination.  11/16/18 - >100,000 CFU/mL Multiple microorganisms present; probable contamination.      05/11/2018 CT a/p with contrast  IMPRESSION:  1. Patient appears to be status post umbilical and supraumbilical ventral  abdominal wall herniorrhaphy. Prominent subcutaneous inflammatory change  overlying the previous supraumbilical hernia. This may reflect inflammation,  phlegmon or fat necrosis. No obvious abscess.  2. Intra-abdominal inflammation deep to the a forementioned subcutaneous  supraumbilical hernia repair, involving the greater omentum with tiny locules of  fluid abutting the anterior surface of the left hepatic lobe. This likely  reflects postoperative changes. Developing infectious process cannot be  excluded. No drainable fluid collection or abscess.  3. Findings which may reflect a very mild/early diverticulitis of the sigmoid  colon. Clinical correlation is recommended.  4. Nonobstructing right renal calculus.  5. Emphysematous changes.  6. Stable indeterminate splenic hypodensities. These may reflect tiny  pseudocysts or hemangioma. Follow-up CT in 12 months to ensure stability.     A copy of today's office visit with all pertinent imaging results and labs were sent to the referring physician.      Patient's BMI is out of the normal parameters.  Information about BMI was given and patient was advised to follow-up with their PCP for further management.    Any elements of the PMH, FH, SHx, ROS, or preliminary elements of  the HPI that were entered by a medical assistant have been reviewed in full.         Harrington Challenger, MD  [facility] [place].   [place], [place]  [phone] (office)      Medical documentation is provided with the assistance of Charlynne Cousins, Medical Scribe for Concepcion Elk, MD.

## 2019-01-16 NOTE — Progress Notes (Signed)
Progress Notes by Kathlen Modyobb, Mendy Chou D, MD at 01/16/19 1315                Author: Kathlen Modyobb, Kean Gautreau D, MD  Service: --  Author Type: Physician       Filed: 01/16/19 2205  Encounter Date: 01/16/2019  Status: Signed          Editor: Kathlen Modyobb, Donita Newland D, MD (Physician)                       01/16/2019      [name] [name]   10/28/1950                          ICD-10-CM  ICD-9-CM             1.  Recurrent UTI   N39.0  599.0  AMB POC PVR, MEAS,POST-VOID RES,US,NON-IMAGING         ASSESSMENT:      ?Recurrent UTI    Clean catch cultures available suggestive of skin flora contamination      Suprapubic and vulvar skin lesions      History of hysterectomy w/ BSO      PLAN:   - Increase fluid intake   - Ensure healthy bowel habits with regular BM daily   - Start D Mannose and Cranberry supplements   - RTC in 2 - 3 months, consider vaginal estrogen if not improvement   - recommend dermatology for recurrent skin abscesses    - Ucx today, call w/ results    - RTC if symptoms, recommend a cath culture.       DISCUSSION:          Chief Complaint       Patient presents with        ?  Recurrent UTI             Pt ref here by her PCP ,Pt states she gets uit's once a month,Pt states she will finish a antibiotic and once done uti will come back           HISTORY OF PRESENT ILLNESS:  [name] [name]  is a 68 y.o. female who presents  today for ?UTI, referred by Lennox GrumblesBrock, Monique, PA-C. Hx HTN, COPD, Hep C, DM, thoracic AAA, PTSD.       All cultures available suggest contamination.          Has history of hysterectomy (says ovaries were removed as well) in 2010 for bleeding. Has not seen GYN since then.            Review of Systems   Constitutional: Fever: No   Skin: Rash: No   HEENT: Hearing difficulty: No   Eyes: Blurred vision: No   Cardiovascular: Chest pain: No   Respiratory: Shortness of breath: Yes   Gastrointestinal: Nausea/vomiting: No   Musculoskeletal: Back pain: No   Neurological: Weakness: No   Psychological: Memory loss: No    Comments/additional findings:         Past Medical History:        Diagnosis  Date         ?  Arthritis       ?  Chronic pain syndrome            related to R hip replacement         ?  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  DM2 (diabetes mellitus, type 2) ([place])       ?  GERD (gastroesophageal reflux disease)       ?  Hepatitis C            HEP C         ?  HTN (hypertension)       ?  Lung nodule, multiple            (CT 10/2016) Small left upper lobe and 1.7 x 1.6 cm left lower lobe nodules not significantly changed from 07/23/2016 and 03/22/2016         ?  Marijuana use       ?  PTSD (post-traumatic stress disorder)            lived through the [place] in 1993         ?  Thoracic ascending aortic aneurysm ([place])            4.4cm noted on CT Chest (10/2016)         ?  Thyroid nodule            2.5 cm stable right thyroid nodule (CT 10/2016)             Past Surgical History:         Procedure  Laterality  Date          ?  HX ENDOSCOPY         ?  HX HERNIA REPAIR    2019          x2          ?  HX HIP REPLACEMENT  Right  01/29/2015     ?  HX HIP REPLACEMENT         ?  HX HYSTERECTOMY    2010          hysterectomy             Social History          Tobacco Use         ?  Smoking status:  Former Smoker     ?  Smokeless tobacco:  Never Used        ?  Tobacco comment: reported as quit smoking around 1990s per spouse       Substance Use Topics         ?  Alcohol use:  No     ?  Drug use:  Yes              Types:  Marijuana             Allergies        Allergen  Reactions         ?  Chocolate [Cocoa]  Sneezing             Confirms allergy but reports "I still eat it"             Family History         Problem  Relation  Age of Onset          ?  Hypertension  Mother       ?  Other  Mother                OSA          ?  Cancer  Father  suspected leukemia per pt's spouse          ?  Cancer  Maternal Aunt            ?  Alcohol abuse   Maternal Grandfather               Current Outpatient Medications          Medication  Sig  Dispense  Refill           ?  dilTIAZem CD (CARDIZEM CD) 240 mg ER capsule  Take 1 Cap by mouth daily.  30 Cap  1     ?  losartan (COZAAR) 50 mg tablet  Take 1 Tab by mouth daily.  30 Tab  1     ?  cetirizine (ZYRTEC) 10 mg tablet  Take 1 Tab by mouth daily.  30 Tab  1     ?  fluticasone propionate (FLONASE) 50 mcg/actuation nasal spray  2 Sprays by Both Nostrils route daily.  1 Bottle  1     ?  LANTUS SOLOSTAR U-100 INSULIN 100 unit/mL (3 mL) inpn  INJECT 32 UNITS SUBCUTANEOUSLY  NIGHTLY  15 mL  0     ?  albuterol (ACCUNEB) 1.25 mg/3 mL nebu  USE 1 VIAL IN NEBULIZER EVERY 6 HOURS AS NEEDED FOR  SHORTNESS  OF  BREATH,  WHEEZING  270 mL  1     ?  cloNIDine HCl (CATAPRES) 0.1 mg tablet  Take 3 Tabs by mouth two (2) times a day.  60 Tab  1     ?  spironolactone (ALDACTONE) 100 mg tablet  TAKE 1 TABLET EVERY DAY  90 Tab  0           ?  metFORMIN ER (GLUCOPHAGE XR) 500 mg tablet  TAKE 1 TABLET TWICE DAILY  180 Tab  1           ?  montelukast (SINGULAIR) 10 mg tablet  TAKE 1 TABLET EVERY DAY  90 Tab  3     ?  gabapentin (NEURONTIN) 300 mg capsule  Take 1 Cap by mouth three (3) times daily. Max Daily Amount: 900 mg.  270 Cap  1     ?  polyethylene glycol (MIRALAX) 17 gram packet  Take 1 Packet by mouth daily. (Patient taking differently: Take 17 g by mouth daily as needed for Constipation.)  30 Packet  1     ?  magnesium oxide (MAG-OX) 400 mg tablet  Take 400 mg by mouth daily.         ?  QUEtiapine SR (SEROQUEL XR) 150 mg sr tablet  Take 150 mg by mouth nightly as needed.         ?  ondansetron (ZOFRAN ODT) 4 mg disintegrating tablet  Take 1 Tab by mouth every eight (8) hours as needed for Nausea. Indications: nausea  12 Tab  0     ?  tiotropium bromide (SPIRIVA RESPIMAT) 2.5 mcg/actuation inhaler  Take 1 Puff by inhalation daily.  1 Inhaler  0     ?  fluticasone propion-salmeterol (ADVAIR DISKUS) 100-50 mcg/dose diskus inhaler   Take 1 Puff by inhalation two (2) times a day.  1 Inhaler  0     ?  traZODone (DESYREL) 50 mg tablet  Take 1 Tab by mouth nightly as needed for Sleep.  20 Tab  0     ?  rosuvastatin (CRESTOR) 5 mg tablet  TAKE 1 TABLET BY MOUTH  NIGHTLY  90 Tab  1           ?  guaiFENesin ER (MUCINEX) 600 mg ER tablet  Take 1 Tab by mouth every twelve (12) hours.  20 Tab  0              PHYSICAL EXAMINATION:       Visit Vitals      Ht  5\' 8"  (1.727 m)     Wt  185 lb (83.9 kg)        BMI  28.13 kg/m??        Constitutional: Well developed, well-nourished female in no acute distress.    CV:  No peripheral swelling noted. RRR radial pulse.   Respiratory: No respiratory distress or difficulties   Abdomen:  Soft and nontender. No masses. No hepatosplenomegaly.    GU Female:  Mild vaginal atrophy.  No leak with cough,  Mild urethral hypermobility. Suprapubic superficial wound with granulation tissue,  1 cm in size, non painful, no erythema, warmth or drainage.     Skin:  Normal color. No evidence of jaundice.      Neuro/Psych:  Patient with appropriate affect.  Alert and oriented.     Lymphatic:   No enlargement of supraclavicular lymph nodes.         REVIEW OF LABS AND IMAGING:          Results for orders placed or performed in visit on 01/16/19     AMB POC PVR, MEAS,POST-VOID RES,US,NON-IMAGING         Result  Value  Ref Range            PVR  0  cc           Urine Culture   11/21/18 - 100,000 CFU/mL Multiple microorganisms present; probable contamination.   11/16/18 - >100,000 CFU/mL Multiple microorganisms present; probable contamination.         05/11/2018 CT a/p with contrast   IMPRESSION:   1. Patient appears to be status post umbilical and supraumbilical ventral   abdominal wall herniorrhaphy. Prominent subcutaneous inflammatory change   overlying the previous supraumbilical hernia. This may reflect inflammation,   phlegmon or fat necrosis. No obvious abscess.   2. Intra-abdominal inflammation deep to the a forementioned subcutaneous    supraumbilical hernia repair, involving the greater omentum with tiny locules of   fluid abutting the anterior surface of the left hepatic lobe. This likely   reflects postoperative changes. Developing infectious process cannot be   excluded. No drainable fluid collection or abscess.   3. Findings which may reflect a very mild/early diverticulitis of the sigmoid   colon. Clinical correlation is recommended.   4. Nonobstructing right renal calculus.   5. Emphysematous changes.   6. Stable indeterminate splenic hypodensities. These may reflect tiny   pseudocysts or hemangioma. Follow-up CT in 12 months to ensure stability.       A copy of today's office visit with all pertinent imaging results and labs were sent to the referring physician.        Patient's BMI is out of the normal parameters.  Information about BMI was given and patient was advised to follow-up with their PCP for further management.      Any elements of the PMH, FH, SHx, ROS, or preliminary elements of the HPI that were entered by a medical assistant have been reviewed in full.             Dyanne CarrelKaitlan D Norrin Shreffler,  MD   [facility]   [address]    [place], [place] [id number]   [phone] (office)        Medical documentation is provided with the assistance of Debbra RidingHolly Smith, Medical Scribe for Theodora BlowBrian H Vaught, MD .

## 2019-02-27 ENCOUNTER — Encounter: Payer: PRIVATE HEALTH INSURANCE | Attending: Urology | Primary: Physician Assistant

## 2019-03-07 DIAGNOSIS — R5383 Other fatigue: Secondary | ICD-10-CM

## 2019-03-08 ENCOUNTER — Inpatient Hospital Stay: Admit: 2019-03-08 | Payer: MEDICARE | Primary: Physician Assistant

## 2019-03-08 LAB — VITAMIN B12
VITAMIN B12: 460 pg/ml (ref 193–986)
Vitamin B12: 460 pg/ml (ref 193–986)

## 2019-03-08 LAB — HEMOGLOBIN A1C W/O EAG
Hemoglobin A1C: 6.7 % — ABNORMAL HIGH (ref 4.2–5.6)
Hemoglobin A1c: 6.7 % — ABNORMAL HIGH (ref 4.2–5.6)

## 2019-03-08 LAB — FERRITIN
Ferritin: 21.3 ng/mL (ref 8.0–252.0)
Ferritin: 21.3 ng/ml (ref 8.0–252.0)

## 2019-03-08 LAB — VITAMIN D 25 HYDROXY: Vitamin D, 25-OH, Total: 62.2 ng/ml (ref 30.0–100.0)

## 2019-03-08 LAB — VITAMIN D, 25 HYDROXY: Vitamin D, 25-OH, Total: 62.2 ng/ml (ref 30.0–100.0)

## 2019-03-09 LAB — CULTURE, URINE: Isolate: 90000 — AB

## 2019-03-13 ENCOUNTER — Inpatient Hospital Stay
Admit: 2019-03-13 | Discharge: 2019-03-13 | Disposition: A | Discharge status: Home / Self Care | Payer: MEDICARE | Attending: Emergency Medicine

## 2019-03-13 ENCOUNTER — Emergency Department: Payer: MEDICARE | Primary: Physician Assistant

## 2019-03-13 ENCOUNTER — Emergency Department: Admit: 2019-03-13 | Payer: MEDICARE | Primary: Physician Assistant

## 2019-03-13 DIAGNOSIS — G43801 Other migraine, not intractable, with status migrainosus: Secondary | ICD-10-CM

## 2019-03-13 LAB — CBC WITH AUTO DIFFERENTIAL
Basophils %: 0.4 % (ref 0–3)
Eosinophils %: 1.2 % (ref 0–5)
Hematocrit: 41.7 % (ref 37.0–50.0)
Hemoglobin: 13.4 gm/dl (ref 13.0–17.2)
Immature Granulocytes: 0.3 % (ref 0.0–3.0)
Lymphocytes %: 25.3 % — ABNORMAL LOW (ref 28–48)
MCH: 28.5 pg (ref 25.4–34.6)
MCHC: 32.1 gm/dl (ref 30.0–36.0)
MCV: 88.5 fL (ref 80.0–98.0)
MPV: 11.6 fL — ABNORMAL HIGH (ref 6.0–10.0)
Monocytes %: 7.7 % (ref 1–13)
Neutrophils %: 65.1 % — ABNORMAL HIGH (ref 34–64)
Nucleated RBCs: 0 (ref 0–0)
Platelets: 216 10*3/uL (ref 140–450)
RBC: 4.71 M/uL (ref 3.60–5.20)
RDW-SD: 43 (ref 36.4–46.3)
WBC: 9.8 10*3/uL (ref 4.0–11.0)

## 2019-03-13 LAB — COMPREHENSIVE METABOLIC PANEL
ALT: 12 U/L (ref 12–78)
AST: 8 U/L — ABNORMAL LOW (ref 15–37)
Albumin: 3.8 gm/dl (ref 3.4–5.0)
Alkaline Phosphatase: 92 U/L (ref 45–117)
Anion Gap: 5 mmol/L (ref 5–15)
BUN: 16 mg/dl (ref 7–25)
CO2: 32 mEq/L (ref 21–32)
Calcium: 10.1 mg/dl (ref 8.5–10.1)
Chloride: 104 mEq/L (ref 98–107)
Creatinine: 1 mg/dl (ref 0.6–1.3)
EGFR IF NonAfrican American: 59
GFR African American: >60
Glucose: 107 mg/dl — ABNORMAL HIGH (ref 74–106)
Potassium: 4.5 mEq/L (ref 3.5–5.1)
Sodium: 141 mEq/L (ref 136–145)
Total Bilirubin: 0.5 mg/dl (ref 0.2–1.0)
Total Protein: 8.2 gm/dl (ref 6.4–8.2)

## 2019-03-13 LAB — EKG 12-LEAD
Atrial Rate: 88 {beats}/min
Diagnosis: NORMAL
P Axis: 78 degrees
P-R Interval: 176 ms
Q-T Interval: 380 ms
QRS Duration: 76 ms
QTc Calculation (Bazett): 459 ms
R Axis: 25 degrees
T Axis: 14 degrees
Ventricular Rate: 88 {beats}/min

## 2019-03-13 LAB — TROPONIN: Troponin I: <0.015 ng/ml (ref 0.000–0.045)

## 2019-03-13 LAB — EKG, 12 LEAD, INITIAL
Atrial Rate: 88 {beats}/min
Calculated P Axis: 78 degrees
Calculated R Axis: 25 degrees
Calculated T Axis: 14 degrees
Diagnosis: NORMAL
P-R Interval: 176 ms
Q-T Interval: 380 ms
QRS Duration: 76 ms
QTC Calculation (Bezet): 459 ms
Ventricular Rate: 88 {beats}/min

## 2019-03-13 LAB — METABOLIC PANEL, COMPREHENSIVE
ALT (SGPT): 12 U/L (ref 12–78)
AST (SGOT): 8 U/L — ABNORMAL LOW (ref 15–37)
Albumin: 3.8 gm/dl (ref 3.4–5.0)
Alk. phosphatase: 92 U/L (ref 45–117)
Anion gap: 5 mmol/L (ref 5–15)
BUN: 16 mg/dl (ref 7–25)
Bilirubin, total: 0.5 mg/dl (ref 0.2–1.0)
CO2: 32 mEq/L (ref 21–32)
Calcium: 10.1 mg/dl (ref 8.5–10.1)
Chloride: 104 mEq/L (ref 98–107)
Creatinine: 1 mg/dl (ref 0.6–1.3)
GFR est AA: >60
GFR est non-AA: 59
Glucose: 107 mg/dl — ABNORMAL HIGH (ref 74–106)
Potassium: 4.5 mEq/L (ref 3.5–5.1)
Protein, total: 8.2 gm/dl (ref 6.4–8.2)
Sodium: 141 mEq/L (ref 136–145)

## 2019-03-13 LAB — CBC WITH AUTOMATED DIFF
BASOPHILS: 0.4 % (ref 0–3)
EOSINOPHILS: 1.2 % (ref 0–5)
HCT: 41.7 % (ref 37.0–50.0)
HGB: 13.4 gm/dl (ref 13.0–17.2)
IMMATURE GRANULOCYTES: 0.3 % (ref 0.0–3.0)
LYMPHOCYTES: 25.3 % — ABNORMAL LOW (ref 28–48)
MCH: 28.5 pg (ref 25.4–34.6)
MCHC: 32.1 gm/dl (ref 30.0–36.0)
MCV: 88.5 fL (ref 80.0–98.0)
MONOCYTES: 7.7 % (ref 1–13)
MPV: 11.6 fL — ABNORMAL HIGH (ref 6.0–10.0)
NEUTROPHILS: 65.1 % — ABNORMAL HIGH (ref 34–64)
NRBC: 0 (ref 0–0)
PLATELET: 216 10*3/uL (ref 140–450)
RBC: 4.71 M/uL (ref 3.60–5.20)
RDW-SD: 43 (ref 36.4–46.3)
WBC: 9.8 10*3/uL (ref 4.0–11.0)

## 2019-03-13 LAB — TROPONIN I: Troponin-I: <0.015 ng/ml (ref 0.000–0.045)

## 2019-03-13 MED ORDER — BUTALBITAL-ACETAMINOPHEN-CAFFEINE 50 MG-300 MG-40 MG CAPSULE
50-300-40 mg | ORAL_CAPSULE | ORAL | 0 refills | Status: DC | PRN
Start: 2019-03-13 — End: 2019-07-05

## 2019-03-13 MED ORDER — SODIUM CHLORIDE 0.9% BOLUS IV
0.9 % | INTRAVENOUS | Status: AC
Start: 2019-03-13 — End: 2019-03-13
  Administered 2019-03-13: 17:00:00 via INTRAVENOUS

## 2019-03-13 MED ORDER — PROCHLORPERAZINE EDISYLATE 5 MG/ML INJECTION
5 mg/mL | INTRAMUSCULAR | Status: AC
Start: 2019-03-13 — End: 2019-03-13
  Administered 2019-03-13: 18:00:00 via INTRAVENOUS

## 2019-03-13 MED ORDER — DIPHENHYDRAMINE HCL 50 MG/ML IJ SOLN
50 mg/mL | INTRAMUSCULAR | Status: AC
Start: 2019-03-13 — End: 2019-03-13
  Administered 2019-03-13: 18:00:00 via INTRAVENOUS

## 2019-03-13 MED ORDER — KETOROLAC TROMETHAMINE 15 MG/ML INJECTION
15 mg/mL | INTRAMUSCULAR | Status: AC
Start: 2019-03-13 — End: 2019-03-13
  Administered 2019-03-13: 17:00:00 via INTRAVENOUS

## 2019-03-13 MED ORDER — ONDANSETRON 4 MG TAB, RAPID DISSOLVE
4 mg | ORAL_TABLET | Freq: Three times a day (TID) | ORAL | 0 refills | Status: DC | PRN
Start: 2019-03-13 — End: 2019-05-07

## 2019-03-13 MED ORDER — ONDANSETRON (PF) 4 MG/2 ML INJECTION
4 mg/2 mL | INTRAMUSCULAR | Status: AC
Start: 2019-03-13 — End: 2019-03-13
  Administered 2019-03-13: 17:00:00 via INTRAVENOUS

## 2019-03-13 MED ORDER — SODIUM CHLORIDE 0.9 % IJ SYRG
Freq: Once | INTRAMUSCULAR | Status: AC
Start: 2019-03-13 — End: 2019-03-13
  Administered 2019-03-13: 17:00:00 via INTRAVENOUS

## 2019-03-13 MED FILL — KETOROLAC TROMETHAMINE 15 MG/ML INJECTION: 15 mg/mL | INTRAMUSCULAR | Qty: 1

## 2019-03-13 MED FILL — DIPHENHYDRAMINE HCL 50 MG/ML IJ SOLN: 50 mg/mL | INTRAMUSCULAR | Qty: 1

## 2019-03-13 MED FILL — ONDANSETRON (PF) 4 MG/2 ML INJECTION: 4 mg/2 mL | INTRAMUSCULAR | Qty: 2

## 2019-03-13 MED FILL — PROCHLORPERAZINE EDISYLATE 5 MG/ML INJECTION: 5 mg/mL | INTRAMUSCULAR | Qty: 2

## 2019-03-13 NOTE — ED Triage Notes (Addendum)
Pt presents to ED c/o a headache x 3 days, SOB that started yesterday, and hypotension this morning. Pt normotensive in triage. Denies neurological changes or viion changes. Denies N/V/D

## 2019-03-13 NOTE — ED Notes (Signed)
purewick placed per pt's request; pt still c/o ha and asking for additional pain meds; dr parker aware

## 2019-03-13 NOTE — ED Notes (Signed)
Pt out for CT

## 2019-03-13 NOTE — ED Notes (Signed)
Discharge instructions given to patient and pt verbalized understanding.  Pt ambulatory w/ steady gait.       My Medications      START taking these medications      Instructions Each Dose to Equal Morning Noon Evening Bedtime   butalbital-acetaminophen-caff 50-300-40 mg per capsule  Commonly known as:  FIORICET    Your last dose was:      Your next dose is:          Take 1 Cap by mouth every four (4) hours as needed for Headache.   1 Cap                    CONTINUE taking these medications      Instructions Each Dose to Equal Morning Noon Evening Bedtime   ondansetron 4 mg disintegrating tablet  Commonly known as:  Zofran ODT    Your last dose was:      Your next dose is:          Take 1 Tab by mouth every eight (8) hours as needed for Nausea. Indications: nausea   4 mg                    ASK your doctor about these medications      Instructions Each Dose to Equal Morning Noon Evening Bedtime   albuterol 1.25 mg/3 mL Nebu  Commonly known as:  ACCUNEB    Your last dose was:      Your next dose is:          USE 1 VIAL IN NEBULIZER EVERY 6 HOURS AS NEEDED FOR  SHORTNESS  OF  BREATH,  WHEEZING                  cetirizine 10 mg tablet  Commonly known as:  ZYRTEC    Your last dose was:      Your next dose is:          Take 1 Tab by mouth daily.   10 mg                 cloNIDine HCL 0.1 mg tablet  Commonly known as:  CATAPRES    Your last dose was:      Your next dose is:          Take 3 Tabs by mouth two (2) times a day.   0.3 mg                 dilTIAZem ER 240 mg capsule  Commonly known as:  CARDIZEM CD    Your last dose was:      Your next dose is:          Take 1 Cap by mouth daily.   240 mg                 fluticasone propion-salmeteroL 100-50 mcg/dose diskus inhaler  Commonly known as:  Advair Diskus    Your last dose was:      Your next dose is:          Take 1 Puff by inhalation two (2) times a day.   1 Puff                 fluticasone propionate 50 mcg/actuation nasal spray  Commonly known as:  FLONASE     Your last dose was:      Your next dose is:  2 Sprays by Both Nostrils route daily.   2 Spray                 gabapentin 300 mg capsule  Commonly known as:  NEURONTIN    Your last dose was:      Your next dose is:          Take 1 Cap by mouth three (3) times daily. Max Daily Amount: 900 mg.   300 mg                 guaiFENesin ER 600 mg ER tablet  Commonly known as:  MUCINEX    Your last dose was:      Your next dose is:          Take 1 Tab by mouth every twelve (12) hours.   600 mg                 Lantus Solostar U-100 Insulin 100 unit/mL (3 mL) Inpn  Generic drug:  insulin glargine    Your last dose was:      Your next dose is:          INJECT 32 UNITS SUBCUTANEOUSLY  NIGHTLY                  losartan 50 mg tablet  Commonly known as:  COZAAR    Your last dose was:      Your next dose is:          Take 1 Tab by mouth daily.   50 mg                 magnesium oxide 400 mg tablet  Commonly known as:  MAG-OX    Your last dose was:      Your next dose is:          Take 400 mg by mouth daily.   400 mg                 metFORMIN ER 500 mg tablet  Commonly known as:  GLUCOPHAGE XR    Your last dose was:      Your next dose is:          TAKE 1 TABLET TWICE DAILY                  montelukast 10 mg tablet  Commonly known as:  SINGULAIR    Your last dose was:      Your next dose is:          TAKE 1 TABLET EVERY DAY                  polyethylene glycol 17 gram packet  Commonly known as:  MIRALAX    Your last dose was:      Your next dose is:          Take 1 Packet by mouth daily.   17 g                 QUEtiapine SR 150 mg sr tablet  Commonly known as:  SEROquel XR    Your last dose was:      Your next dose is:          Take 150 mg by mouth nightly as needed.   150 mg                 rosuvastatin 5 mg tablet  Commonly known as:  CRESTOR    Your last dose was:      Your next dose is:          TAKE 1 TABLET BY MOUTH NIGHTLY                  spironolactone 100 mg tablet  Commonly known as:  ALDACTONE     Your last dose was:      Your next dose is:          TAKE 1 TABLET EVERY DAY                  tiotropium bromide 2.5 mcg/actuation inhaler  Commonly known as:  SPIRIVA RESPIMAT    Your last dose was:      Your next dose is:          Take 1 Puff by inhalation daily.   1 Puff                 traZODone 50 mg tablet  Commonly known as:  DESYREL    Your last dose was:      Your next dose is:          Take 1 Tab by mouth nightly as needed for Sleep.   50 mg                       Where to Get Your Medications      These medications were sent to [facility] [address] [place], [place] - [address]  [address] [place], [place] [address]    Phone:  [phone]   ?? butalbital-acetaminophen-caff 50-300-40 mg per capsule  ?? ondansetron 4 mg disintegrating tablet         Discussed meds and side effects w/ pt.

## 2019-03-13 NOTE — ED Provider Notes (Signed)
[facility]  Emergency Department Treatment Report    Patient: [name] [name] Age: 68 y.o. Sex: female    Date of Birth: [birth year] Admit Date: 03/13/2019 PCP: Ernst Breach   MRN: [id number]  CSN: [id number]     Room: ER05/ER05 Time Dictated: 2:59 PM        Chief Complaint   Chief Complaint   Patient presents with   ??? Headache   ??? Shortness of Breath       History of Present Illness   68 y.o. female presents with vague symptoms, her primary complaint is headache and nausea, she also reports some mild dyspnea since yesterday, and says that her blood pressure has been running low (it is normal here).  The symptoms have happened before.  The headache was gradual in onset and dull, it waxes and wanes.  It was not a thunderclap headache.  She denies any chest pain, diarrhea, URI symptoms, or any other symptoms.  She has not tried taking anything.  She does not smoke cigarettes but says she smokes marijuana occasionally        Review of Systems   Constitutional:  No fever  Eyes: No visual complaints.  ENT: No URI symptoms  Heme/Lymph: Easy bruising or bleeding  Respiratory: Mild shortness of breath but no cough or wheezing  Cardiovascular: No chest pain, pressure, or palpitations.  Gastrointestinal: Nausea but no vomiting, diarrhea or abdominal pain.   Genitourinary: Dysuria frequency urgency  Musculoskeletal: No joint pain, swelling, or extremity pain.  Integumentary: No rashes or other skin lesions  Neurological: Headache.  No sensory or motor complaints        Past Medical/Surgical History     Past Medical History:   Diagnosis Date   ??? Arthritis    ??? 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 DM2 (diabetes mellitus, type 2) ([place])    ??? GERD (gastroesophageal reflux disease)    ??? Hepatitis C     HEP C   ??? HTN (hypertension)    ??? Lung nodule, multiple      (CT 10/2016) Small left upper lobe and 1.7 x 1.6 cm left lower lobe nodules not significantly changed from 07/23/2016 and 03/22/2016   ??? Marijuana use    ??? PTSD (post-traumatic stress disorder)     lived through the [place] in 1993   ??? Thoracic ascending aortic aneurysm ([place])     4.4cm noted on CT Chest (10/2016)   ??? Thyroid nodule     2.5 cm stable right thyroid nodule (CT 10/2016)     Past Surgical History:   Procedure Laterality Date   ??? HX ENDOSCOPY     ??? HX HERNIA REPAIR  2019    x2   ??? HX HIP REPLACEMENT Right 01/29/2015   ??? HX HIP REPLACEMENT     ??? HX HYSTERECTOMY  2010    hysterectomy       Social History     Social History     Socioeconomic History   ??? Marital status: MARRIED     Spouse name: Not on file   ??? Number of children: Not on file   ??? Years of education: Not on file   ??? Highest education level: Not on file   Tobacco Use   ??? Smoking status: Former Smoker   ???  Smokeless tobacco: Never Used   ??? Tobacco comment: reported as quit smoking around 1990s per spouse   Substance and Sexual Activity   ??? Alcohol use: No   ??? Drug use: Yes     Types: Marijuana   ??? Sexual activity: Never       Family History     Family History   Problem Relation Age of Onset   ??? Hypertension Mother    ??? Other Mother         OSA   ??? Cancer Father         suspected leukemia per pt's spouse   ??? Cancer Maternal Aunt    ??? Alcohol abuse Maternal Grandfather        Current Medications     Prior to Admission Medications   Prescriptions Last Dose Informant Patient Reported? Taking?   LANTUS SOLOSTAR U-100 INSULIN 100 unit/mL (3 mL) inpn   No No   Sig: INJECT 32 UNITS SUBCUTANEOUSLY  NIGHTLY   QUEtiapine SR (SEROQUEL XR) 150 mg sr tablet   Yes No   Sig: Take 150 mg by mouth nightly as needed.   albuterol (ACCUNEB) 1.25 mg/3 mL nebu   No No   Sig: USE 1 VIAL IN NEBULIZER EVERY 6 HOURS AS NEEDED FOR  SHORTNESS  OF  BREATH,  WHEEZING   cetirizine (ZYRTEC) 10 mg tablet   No No   Sig: Take 1 Tab by mouth daily.    cloNIDine HCl (CATAPRES) 0.1 mg tablet   No No   Sig: Take 3 Tabs by mouth two (2) times a day.   dilTIAZem CD (CARDIZEM CD) 240 mg ER capsule   No No   Sig: Take 1 Cap by mouth daily.   fluticasone propion-salmeterol (ADVAIR DISKUS) 100-50 mcg/dose diskus inhaler   No No   Sig: Take 1 Puff by inhalation two (2) times a day.   fluticasone propionate (FLONASE) 50 mcg/actuation nasal spray   No No   Sig: 2 Sprays by Both Nostrils route daily.   gabapentin (NEURONTIN) 300 mg capsule   No No   Sig: Take 1 Cap by mouth three (3) times daily. Max Daily Amount: 900 mg.   guaiFENesin ER (MUCINEX) 600 mg ER tablet   No No   Sig: Take 1 Tab by mouth every twelve (12) hours.   losartan (COZAAR) 50 mg tablet   No No   Sig: Take 1 Tab by mouth daily.   magnesium oxide (MAG-OX) 400 mg tablet   Yes No   Sig: Take 400 mg by mouth daily.   metFORMIN ER (GLUCOPHAGE XR) 500 mg tablet   No No   Sig: TAKE 1 TABLET TWICE DAILY   montelukast (SINGULAIR) 10 mg tablet   No No   Sig: TAKE 1 TABLET EVERY DAY   ondansetron (ZOFRAN ODT) 4 mg disintegrating tablet   No No   Sig: Take 1 Tab by mouth every eight (8) hours as needed for Nausea. Indications: nausea   polyethylene glycol (MIRALAX) 17 gram packet   No No   Sig: Take 1 Packet by mouth daily.   Patient taking differently: Take 17 g by mouth daily as needed for Constipation.   rosuvastatin (CRESTOR) 5 mg tablet   No No   Sig: TAKE 1 TABLET BY MOUTH NIGHTLY   spironolactone (ALDACTONE) 100 mg tablet   No No   Sig: TAKE 1 TABLET EVERY DAY   tiotropium bromide (SPIRIVA RESPIMAT) 2.5 mcg/actuation inhaler   No  No   Sig: Take 1 Puff by inhalation daily.   traZODone (DESYREL) 50 mg tablet   No No   Sig: Take 1 Tab by mouth nightly as needed for Sleep.      Facility-Administered Medications: None     Allergies     Allergies   Allergen Reactions   ??? Chocolate [Cocoa] Sneezing     Confirms allergy but reports "I still eat it"     Physical Exam   TRIAGE VITALS:   ED Triage Vitals [03/13/19 1059]    ED Encounter Vitals Group      BP 106/89      Pulse (Heart Rate) 88      Resp Rate 16      Temp 98.9 ??F (37.2 ??C)      Temp src       O2 Sat (%) 98 %      Weight 208 lb      Height 5' 6"        Constituational: Patient is afebrile, Vital signs reviewed.  She is in no distress  Head: Atraumatic, normocephalic  Eyes: Normal inspection. No conjunctival injection or discharge.  No scleral icterus.  PERRL.   ENT:  No facial bruises, normal external ear and nose inspection.  Mucous membranes moist.   Neck:  Supple , non tender. normal inspection.    Cardiovascular:  regular rate and rhythm, heart sounds normal without murmurs.  Respiratory:  No respiratory distress, breath sounds equal bilaterally.  No rales or wheezing  Abdomen: soft, Non- tender to palpation.    Back: No CVA tenderness  Musculoskeletal:  normal ROM, non-tender, no deformities  Skin: color normal, no rash, warm, dry .  Neuro: awake & alert, no gross motor/sensory deficit is apparent.   Psych: Flat affect      Impression and Management Plan   Patient with weakness, shortness of breath, headaches, given her vague symptoms and age her work-up will be initiated to include labs and imaging studies which will be utilized to narrow the differential diagnosis and evaluate the potential causes of the patients chief complaint, and final disposition will be based on the results of their workup as well as their response to symptomatic treatment.    Diagnostic Studies     Results for orders placed or performed during the hospital encounter of 03/13/19   XR CHEST SNGL V    Narrative    Examination:  Frontal view of the chest.     INDICATION: dyspnea    COMPARISON: Chest radiograph dated 09/04/2018      Impression    Impression: No obvious acute cardiopulmonary process.    Known scattered bullae or pneumatocele bilaterally on the previous CT appear  less conspicuous. Left midlung zone density corresponds with area of atelectasis   on the previous CT. No definite focal consolidations. Known left lower lobe lung  nodule again demonstrated. Cardiomediastinal silhouette within normal limits.     CT HEAD WO CONT    Narrative    Examination: CT head without contrast.    INDICATION: dizzness, headache, blurry vision.        COMPARISON: CT head dated 09/30/2017.    TECHNIQUE: CT head without contrast. DICOM format image data is available to  non-affiliated external healthcare facilities or entities on a secure, media  free, reciprocally searchable basis with patient authorization for 12 months  following the date of the study.    FINDINGS:    No focal mass, midline shift or acute intracranial  hemorrhage.    Diffuse parenchymal volume loss and extensive periventricular subcortical areas  of chronic small vessel ischemic change. Stable 1 cm ovoid hyperdensity  involving the left posterior cranial fossa, interposed between the mastoid air  cells and the left cerebellar hemisphere (image 13 on series 6), probably  reflecting a meningioma.    Paranasal sinuses, mastoid air cells and middle ear cavities grossly  unremarkable.    Dehiscence of the right lamina papyracea, which may reflect sequela of previous  injury or variation of normal anatomy. Opacification of the right posterior  ethmoid air cell.      Impression    IMPRESSION:  1. No definite acute intracranial abnormalities.  2. Diffuse parenchymal volume loss and extensive chronic small vessel ischemic  change.  3. 1 cm ovoid nodule within the left posterior fossa, probably reflecting a  meningioma.     CBC WITH AUTOMATED DIFF   Result Value Ref Range    WBC 9.8 4.0 - 11.0 1000/mm3    RBC 4.71 3.60 - 5.20 M/uL    HGB 13.4 13.0 - 17.2 gm/dl    HCT 41.7 37.0 - 50.0 %    MCV 88.5 80.0 - 98.0 fL    MCH 28.5 25.4 - 34.6 pg    MCHC 32.1 30.0 - 36.0 gm/dl    PLATELET 216 140 - 450 1000/mm3    MPV 11.6 (H) 6.0 - 10.0 fL    RDW-SD 43.0 36.4 - 46.3      NRBC 0 0 - 0      IMMATURE GRANULOCYTES 0.3 0.0 - 3.0 %     NEUTROPHILS 65.1 (H) 34 - 64 %    LYMPHOCYTES 25.3 (L) 28 - 48 %    MONOCYTES 7.7 1 - 13 %    EOSINOPHILS 1.2 0 - 5 %    BASOPHILS 0.4 0 - 3 %   METABOLIC PANEL, COMPREHENSIVE   Result Value Ref Range    Sodium 141 136 - 145 mEq/L    Potassium 4.5 3.5 - 5.1 mEq/L    Chloride 104 98 - 107 mEq/L    CO2 32 21 - 32 mEq/L    Glucose 107 (H) 74 - 106 mg/dl    BUN 16 7 - 25 mg/dl    Creatinine 1.0 0.6 - 1.3 mg/dl    GFR est AA >60.0      GFR est non-AA 59      Calcium 10.1 8.5 - 10.1 mg/dl    AST (SGOT) 8 (L) 15 - 37 U/L    ALT (SGPT) 12 12 - 78 U/L    Alk. phosphatase 92 45 - 117 U/L    Bilirubin, total 0.5 0.2 - 1.0 mg/dl    Protein, total 8.2 6.4 - 8.2 gm/dl    Albumin 3.8 3.4 - 5.0 gm/dl    Anion gap 5 5 - 15 mmol/L   TROPONIN I   Result Value Ref Range    Troponin-I <0.015 0.000 - 0.045 ng/ml       Xr Chest Sngl V    Result Date: 03/13/2019  Examination:  Frontal view of the chest. INDICATION: dyspnea COMPARISON: Chest radiograph dated 09/04/2018     Impression: No obvious acute cardiopulmonary process. Known scattered bullae or pneumatocele bilaterally on the previous CT appear less conspicuous. Left midlung zone density corresponds with area of atelectasis on the previous CT. No definite focal consolidations. Known left lower lobe lung nodule again demonstrated. Cardiomediastinal silhouette within normal limits.  Ct Head Wo Cont    Result Date: 03/13/2019  Examination: CT head without contrast. INDICATION: dizzness, headache, blurry vision.    COMPARISON: CT head dated 09/30/2017. TECHNIQUE: CT head without contrast. DICOM format image data is available to non-affiliated external healthcare facilities or entities on a secure, media free, reciprocally searchable basis with patient authorization for 12 months following the date of the study. FINDINGS: No focal mass, midline shift or acute intracranial hemorrhage. Diffuse parenchymal volume loss and extensive periventricular subcortical areas of chronic small vessel  ischemic change. Stable 1 cm ovoid hyperdensity involving the left posterior cranial fossa, interposed between the mastoid air cells and the left cerebellar hemisphere (image 13 on series 6), probably reflecting a meningioma. Paranasal sinuses, mastoid air cells and middle ear cavities grossly unremarkable. Dehiscence of the right lamina papyracea, which may reflect sequela of previous injury or variation of normal anatomy. Opacification of the right posterior ethmoid air cell.     IMPRESSION: 1. No definite acute intracranial abnormalities. 2. Diffuse parenchymal volume loss and extensive chronic small vessel ischemic change. 3. 1 cm ovoid nodule within the left posterior fossa, probably reflecting a meningioma.       EKG:  Read and interpreted by me, Normal sinus rhythm with nonspecific T wave changes but without any acute ischemic changes, ventricular rate of 88 with a QTC of 459 and PR interval of 176  ED Course        She received IV fluids, Zofran, and Toradol which did improve her nausea little bit but her headache was not improved at all.  As such I gave her Compazine and Benadryl which caused near complete relief of her headache, and patient remained clinically stable throughout the emergency room visit.  Vitals were unremarkable and the patient's condition required no further Emergency Department intervention.  All results were discussed with the patient/family and all questions answered and they understand the importance of follow up.    MOST RECENT VITALS   Visit Vitals  BP (!) 129/96   Pulse 87   Temp 98.9 ??F (37.2 ??C)   Resp 20   Ht 5' 6"  (1.676 m)   Wt 94.3 kg (208 lb)   SpO2 96%   BMI 33.57 kg/m??       Medications   sodium chloride (NS) flush 5-10 mL (10 mL IntraVENous Given 03/13/19 1248)   sodium chloride 0.9 % bolus infusion 1,000 mL (0 mL IntraVENous IV Completed 03/13/19 1415)   ondansetron (ZOFRAN) injection 4 mg (4 mg IntraVENous Given 03/13/19 1247)    ketorolac (TORADOL) injection 15 mg (15 mg IntraVENous Given 03/13/19 1248)   prochlorperazine (COMPAZINE) injection 10 mg (10 mg IntraVENous Given 03/13/19 1415)   diphenhydrAMINE (BENADRYL) injection 12.5 mg (12.5 mg IntraVENous Given 03/13/19 1415)         Procedures    Medical Decision Making   Vague symptoms, primary issue is headache with nausea and I suspect this is migrainous in nature, after treatment she feels much better and is able to go home.  I discharged her with occasions for symptomatic treatment    Final Diagnosis       ICD-10-CM ICD-9-CM   1. Other migraine with status migrainosus, not intractable  G43.801 346.82   2. Dehydration  E86.0 276.51   3. SOB (shortness of breath)  R06.02 786.05       Disposition   The patient is discharged home with verbal and written instructions and a referral for ongoing care.  The patient  is aware that they may return at any time for new or worsening symptoms and understand their responsibility to do so if there are any concerns.  The patient verbalized understanding of their discharge instructions.   Prescriptions were provided as appropriate.    Discharged          Follow-up Information     Follow up With Specialties Details Why Contact Info    Wallene Huh, PA-C Physician Assistant Schedule an appointment as soon as possible for a visit  For further evaluation and treatment [id number] [id number]  [phone]              Mickle Plumb, MD    My signature above authenticates this document and my orders, the final diagnosis (es), discharge prescription (s), and instructions in the Epic record.  If you have any questions please contact [phone].     Nursing notes have been reviewed by the physician/ advanced practice Clinician.    Dragon medical dictation software was used for portions of this report. Unintended voice recognition errors may occur.

## 2019-03-13 NOTE — ED Provider Notes (Signed)
[facility]  Emergency Department Treatment Report    Patient: [name] [name] Age: 68 y.o. Sex: female    Date of Birth: 06/14/1950 Admit Date: 03/13/2019 PCP: Ernst Breach   MRN: [id number]  CSN: [id number]     Room: ER05/ER05 Time Dictated: 2:59 PM        Chief Complaint   Chief Complaint   Patient presents with   ??? Headache   ??? Shortness of Breath       History of Present Illness   68 y.o. female presents with vague symptoms, her primary complaint is headache and nausea, she also reports some mild dyspnea since yesterday, and says that her blood pressure has been running low (it is normal here).  The symptoms have happened before.  The headache was gradual in onset and dull, it waxes and wanes.  It was not a thunderclap headache.  She denies any chest pain, diarrhea, URI symptoms, or any other symptoms.  She has not tried taking anything.  She does not smoke cigarettes but says she smokes marijuana occasionally        Review of Systems   Constitutional:  No fever  Eyes: No visual complaints.  ENT: No URI symptoms  Heme/Lymph: Easy bruising or bleeding  Respiratory: Mild shortness of breath but no cough or wheezing  Cardiovascular: No chest pain, pressure, or palpitations.  Gastrointestinal: Nausea but no vomiting, diarrhea or abdominal pain.   Genitourinary: Dysuria frequency urgency  Musculoskeletal: No joint pain, swelling, or extremity pain.  Integumentary: No rashes or other skin lesions  Neurological: Headache.  No sensory or motor complaints        Past Medical/Surgical History     Past Medical History:   Diagnosis Date   ??? Arthritis    ??? 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 DM2 (diabetes mellitus, type 2) ([place])    ??? GERD (gastroesophageal reflux disease)    ??? Hepatitis C     HEP C   ??? HTN (hypertension)    ??? Lung nodule, multiple     (CT 10/2016) Small left upper lobe and 1.7  x 1.6 cm left lower lobe nodules not significantly changed from 07/23/2016 and 03/22/2016   ??? Marijuana use    ??? PTSD (post-traumatic stress disorder)     lived through the [place] in [place]   ??? Thoracic ascending aortic aneurysm ([place])     4.4cm noted on CT Chest (10/2016)   ??? Thyroid nodule     2.5 cm stable right thyroid nodule (CT 10/2016)     Past Surgical History:   Procedure Laterality Date   ??? HX ENDOSCOPY     ??? HX HERNIA REPAIR  2019    x2   ??? HX HIP REPLACEMENT Right 01/29/2015   ??? HX HIP REPLACEMENT     ??? HX HYSTERECTOMY  2010    hysterectomy       Social History     Social History     Socioeconomic History   ??? Marital status: MARRIED     Spouse name: Not on file   ??? Number of children: Not on file   ??? Years of education: Not on file   ??? Highest education level: Not on file   Tobacco Use   ??? Smoking status: Former Smoker   ???  Smokeless tobacco: Never Used   ??? Tobacco comment: reported as quit smoking around 1990s per spouse   Substance and Sexual Activity   ??? Alcohol use: No   ??? Drug use: Yes     Types: Marijuana   ??? Sexual activity: Never       Family History     Family History   Problem Relation Age of Onset   ??? Hypertension Mother    ??? Other Mother         OSA   ??? Cancer Father         suspected leukemia per pt's spouse   ??? Cancer Maternal Aunt    ??? Alcohol abuse Maternal Grandfather        Current Medications     Prior to Admission Medications   Prescriptions Last Dose Informant Patient Reported? Taking?   LANTUS SOLOSTAR U-100 INSULIN 100 unit/mL (3 mL) inpn   No No   Sig: INJECT 32 UNITS SUBCUTANEOUSLY  NIGHTLY   QUEtiapine SR (SEROQUEL XR) 150 mg sr tablet   Yes No   Sig: Take 150 mg by mouth nightly as needed.   albuterol (ACCUNEB) 1.25 mg/3 mL nebu   No No   Sig: USE 1 VIAL IN NEBULIZER EVERY 6 HOURS AS NEEDED FOR  SHORTNESS  OF  BREATH,  WHEEZING   cetirizine (ZYRTEC) 10 mg tablet   No No   Sig: Take 1 Tab by mouth daily.   cloNIDine HCl (CATAPRES) 0.1 mg tablet   No No   Sig: Take 3  Tabs by mouth two (2) times a day.   dilTIAZem CD (CARDIZEM CD) 240 mg ER capsule   No No   Sig: Take 1 Cap by mouth daily.   fluticasone propion-salmeterol (ADVAIR DISKUS) 100-50 mcg/dose diskus inhaler   No No   Sig: Take 1 Puff by inhalation two (2) times a day.   fluticasone propionate (FLONASE) 50 mcg/actuation nasal spray   No No   Sig: 2 Sprays by Both Nostrils route daily.   gabapentin (NEURONTIN) 300 mg capsule   No No   Sig: Take 1 Cap by mouth three (3) times daily. Max Daily Amount: 900 mg.   guaiFENesin ER (MUCINEX) 600 mg ER tablet   No No   Sig: Take 1 Tab by mouth every twelve (12) hours.   losartan (COZAAR) 50 mg tablet   No No   Sig: Take 1 Tab by mouth daily.   magnesium oxide (MAG-OX) 400 mg tablet   Yes No   Sig: Take 400 mg by mouth daily.   metFORMIN ER (GLUCOPHAGE XR) 500 mg tablet   No No   Sig: TAKE 1 TABLET TWICE DAILY   montelukast (SINGULAIR) 10 mg tablet   No No   Sig: TAKE 1 TABLET EVERY DAY   ondansetron (ZOFRAN ODT) 4 mg disintegrating tablet   No No   Sig: Take 1 Tab by mouth every eight (8) hours as needed for Nausea. Indications: nausea   polyethylene glycol (MIRALAX) 17 gram packet   No No   Sig: Take 1 Packet by mouth daily.   Patient taking differently: Take 17 g by mouth daily as needed for Constipation.   rosuvastatin (CRESTOR) 5 mg tablet   No No   Sig: TAKE 1 TABLET BY MOUTH NIGHTLY   spironolactone (ALDACTONE) 100 mg tablet   No No   Sig: TAKE 1 TABLET EVERY DAY   tiotropium bromide (SPIRIVA RESPIMAT) 2.5 mcg/actuation inhaler   No  No   Sig: Take 1 Puff by inhalation daily.   traZODone (DESYREL) 50 mg tablet   No No   Sig: Take 1 Tab by mouth nightly as needed for Sleep.      Facility-Administered Medications: None     Allergies     Allergies   Allergen Reactions   ??? Chocolate [Cocoa] Sneezing     Confirms allergy but reports "I still eat it"     Physical Exam   TRIAGE VITALS:   ED Triage Vitals [03/13/19 1059]   ED Encounter Vitals Group      BP 106/89      Pulse (Heart  Rate) 88      Resp Rate 16      Temp 98.9 ??F (37.2 ??C)      Temp src       O2 Sat (%) 98 %      Weight 208 lb      Height '5\' 6"'$        Constituational: Patient is afebrile, Vital signs reviewed.  She is in no distress  Head: Atraumatic, normocephalic  Eyes: Normal inspection. No conjunctival injection or discharge.  No scleral icterus.  PERRL.   ENT:  No facial bruises, normal external ear and nose inspection.  Mucous membranes moist.   Neck:  Supple , non tender. normal inspection.    Cardiovascular:  regular rate and rhythm, heart sounds normal without murmurs.  Respiratory:  No respiratory distress, breath sounds equal bilaterally.  No rales or wheezing  Abdomen: soft, Non- tender to palpation.    Back: No CVA tenderness  Musculoskeletal:  normal ROM, non-tender, no deformities  Skin: color normal, no rash, warm, dry .  Neuro: awake & alert, no gross motor/sensory deficit is apparent.   Psych: Flat affect      Impression and Management Plan   Patient with weakness, shortness of breath, headaches, given her vague symptoms and age her work-up will be initiated to include labs and imaging studies which will be utilized to narrow the differential diagnosis and evaluate the potential causes of the patients chief complaint, and final disposition will be based on the results of their workup as well as their response to symptomatic treatment.    Diagnostic Studies     Results for orders placed or performed during the hospital encounter of 03/13/19   XR CHEST SNGL V    Narrative    Examination:  Frontal view of the chest.     INDICATION: dyspnea    COMPARISON: Chest radiograph dated 09/04/2018      Impression    Impression: No obvious acute cardiopulmonary process.    Known scattered bullae or pneumatocele bilaterally on the previous CT appear  less conspicuous. Left midlung zone density corresponds with area of atelectasis  on the previous CT. No definite focal consolidations. Known left lower lobe lung  nodule again  demonstrated. Cardiomediastinal silhouette within normal limits.     CT HEAD WO CONT    Narrative    Examination: CT head without contrast.    INDICATION: dizzness, headache, blurry vision.        COMPARISON: CT head dated 09/30/2017.    TECHNIQUE: CT head without contrast. DICOM format image data is available to  non-affiliated external healthcare facilities or entities on a secure, media  free, reciprocally searchable basis with patient authorization for 12 months  following the date of the study.    FINDINGS:    No focal mass, midline shift or acute intracranial  hemorrhage.    Diffuse parenchymal volume loss and extensive periventricular subcortical areas  of chronic small vessel ischemic change. Stable 1 cm ovoid hyperdensity  involving the left posterior cranial fossa, interposed between the mastoid air  cells and the left cerebellar hemisphere (image 13 on series 6), probably  reflecting a meningioma.    Paranasal sinuses, mastoid air cells and middle ear cavities grossly  unremarkable.    Dehiscence of the right lamina papyracea, which may reflect sequela of previous  injury or variation of normal anatomy. Opacification of the right posterior  ethmoid air cell.      Impression    IMPRESSION:  1. No definite acute intracranial abnormalities.  2. Diffuse parenchymal volume loss and extensive chronic small vessel ischemic  change.  3. 1 cm ovoid nodule within the left posterior fossa, probably reflecting a  meningioma.     CBC WITH AUTOMATED DIFF   Result Value Ref Range    WBC 9.8 4.0 - 11.0 1000/mm3    RBC 4.71 3.60 - 5.20 M/uL    HGB 13.4 13.0 - 17.2 gm/dl    HCT 41.7 37.0 - 50.0 %    MCV 88.5 80.0 - 98.0 fL    MCH 28.5 25.4 - 34.6 pg    MCHC 32.1 30.0 - 36.0 gm/dl    PLATELET 216 140 - 450 1000/mm3    MPV 11.6 (H) 6.0 - 10.0 fL    RDW-SD 43.0 36.4 - 46.3      NRBC 0 0 - 0      IMMATURE GRANULOCYTES 0.3 0.0 - 3.0 %    NEUTROPHILS 65.1 (H) 34 - 64 %    LYMPHOCYTES 25.3 (L) 28 - 48 %    MONOCYTES 7.7 1 - 13 %     EOSINOPHILS 1.2 0 - 5 %    BASOPHILS 0.4 0 - 3 %   METABOLIC PANEL, COMPREHENSIVE   Result Value Ref Range    Sodium 141 136 - 145 mEq/L    Potassium 4.5 3.5 - 5.1 mEq/L    Chloride 104 98 - 107 mEq/L    CO2 32 21 - 32 mEq/L    Glucose 107 (H) 74 - 106 mg/dl    BUN 16 7 - 25 mg/dl    Creatinine 1.0 0.6 - 1.3 mg/dl    GFR est AA >60.0      GFR est non-AA 59      Calcium 10.1 8.5 - 10.1 mg/dl    AST (SGOT) 8 (L) 15 - 37 U/L    ALT (SGPT) 12 12 - 78 U/L    Alk. phosphatase 92 45 - 117 U/L    Bilirubin, total 0.5 0.2 - 1.0 mg/dl    Protein, total 8.2 6.4 - 8.2 gm/dl    Albumin 3.8 3.4 - 5.0 gm/dl    Anion gap 5 5 - 15 mmol/L   TROPONIN I   Result Value Ref Range    Troponin-I <0.015 0.000 - 0.045 ng/ml       Xr Chest Sngl V    Result Date: 03/13/2019  Examination:  Frontal view of the chest. INDICATION: dyspnea COMPARISON: Chest radiograph dated 09/04/2018     Impression: No obvious acute cardiopulmonary process. Known scattered bullae or pneumatocele bilaterally on the previous CT appear less conspicuous. Left midlung zone density corresponds with area of atelectasis on the previous CT. No definite focal consolidations. Known left lower lobe lung nodule again demonstrated. Cardiomediastinal silhouette within normal limits.  Ct Head Wo Cont    Result Date: 03/13/2019  Examination: CT head without contrast. INDICATION: dizzness, headache, blurry vision.    COMPARISON: CT head dated 09/30/2017. TECHNIQUE: CT head without contrast. DICOM format image data is available to non-affiliated external healthcare facilities or entities on a secure, media free, reciprocally searchable basis with patient authorization for 12 months following the date of the study. FINDINGS: No focal mass, midline shift or acute intracranial hemorrhage. Diffuse parenchymal volume loss and extensive periventricular subcortical areas of chronic small vessel ischemic change. Stable 1 cm ovoid hyperdensity involving the left posterior cranial fossa,  interposed between the mastoid air cells and the left cerebellar hemisphere (image 13 on series 6), probably reflecting a meningioma. Paranasal sinuses, mastoid air cells and middle ear cavities grossly unremarkable. Dehiscence of the right lamina papyracea, which may reflect sequela of previous injury or variation of normal anatomy. Opacification of the right posterior ethmoid air cell.     IMPRESSION: 1. No definite acute intracranial abnormalities. 2. Diffuse parenchymal volume loss and extensive chronic small vessel ischemic change. 3. 1 cm ovoid nodule within the left posterior fossa, probably reflecting a meningioma.       EKG:  Read and interpreted by me, Normal sinus rhythm with nonspecific T wave changes but without any acute ischemic changes, ventricular rate of 88 with a QTC of 459 and PR interval of 176  ED Course        She received IV fluids, Zofran, and Toradol which did improve her nausea little bit but her headache was not improved at all.  As such I gave her Compazine and Benadryl which caused near complete relief of her headache, and patient remained clinically stable throughout the emergency room visit.  Vitals were unremarkable and the patient's condition required no further Emergency Department intervention.  All results were discussed with the patient/family and all questions answered and they understand the importance of follow up.    MOST RECENT VITALS   Visit Vitals  BP (!) 129/96   Pulse 87   Temp 98.9 ??F (37.2 ??C)   Resp 20   Ht '5\' 6"'$  (1.676 m)   Wt 94.3 kg (208 lb)   SpO2 96%   BMI 33.57 kg/m??       Medications   sodium chloride (NS) flush 5-10 mL (10 mL IntraVENous Given 03/13/19 1248)   sodium chloride 0.9 % bolus infusion 1,000 mL (0 mL IntraVENous IV Completed 03/13/19 1415)   ondansetron (ZOFRAN) injection 4 mg (4 mg IntraVENous Given 03/13/19 1247)   ketorolac (TORADOL) injection 15 mg (15 mg IntraVENous Given 03/13/19 1248)   prochlorperazine (COMPAZINE) injection 10 mg (10 mg  IntraVENous Given 03/13/19 1415)   diphenhydrAMINE (BENADRYL) injection 12.5 mg (12.5 mg IntraVENous Given 03/13/19 1415)         Procedures    Medical Decision Making   Vague symptoms, primary issue is headache with nausea and I suspect this is migrainous in nature, after treatment she feels much better and is able to go home.  I discharged her with occasions for symptomatic treatment    Final Diagnosis       ICD-10-CM ICD-9-CM   1. Other migraine with status migrainosus, not intractable  G43.801 346.82   2. Dehydration  E86.0 276.51   3. SOB (shortness of breath)  R06.02 786.05       Disposition   The patient is discharged home with verbal and written instructions and a referral for ongoing care.  The patient  is aware that they may return at any time for new or worsening symptoms and understand their responsibility to do so if there are any concerns.  The patient verbalized understanding of their discharge instructions.   Prescriptions were provided as appropriate.    Discharged          Follow-up Information     Follow up With Specialties Details Why Contact Info    Wallene Huh, PA-C Physician Assistant Schedule an appointment as soon as possible for a visit  For further evaluation and treatment [place] [id number]  [phone]              Mickle Plumb, MD    My signature above authenticates this document and my orders, the final diagnosis (es), discharge prescription (s), and instructions in the Epic record.  If you have any questions please contact [phone].     Nursing notes have been reviewed by the physician/ advanced practice Clinician.    Dragon medical dictation software was used for portions of this report. Unintended voice recognition errors may occur.

## 2019-03-13 NOTE — ED Notes (Signed)
purewick placed per pt's request; pt still c/o ha and asking for additional pain meds; dr Jimmey Ralph aware

## 2019-03-13 NOTE — ED Notes (Signed)
 Discharge instructions given to patient and pt verbalized understanding.  Pt ambulatory w/ steady gait.       My Medications      START taking these medications      Instructions Each Dose to Equal Morning Noon Evening Bedtime   butalbital-acetaminophen -caff 50-300-40 mg per capsule  Commonly known as:  FIORICET    Your last dose was:      Your next dose is:          Take 1 Cap by mouth every four (4) hours as needed for Headache.   1 Cap                    CONTINUE taking these medications      Instructions Each Dose to Equal Morning Noon Evening Bedtime   ondansetron  4 mg disintegrating tablet  Commonly known as:  Zofran  ODT    Your last dose was:      Your next dose is:          Take 1 Tab by mouth every eight (8) hours as needed for Nausea. Indications: nausea   4 mg                    ASK your doctor about these medications      Instructions Each Dose to Equal Morning Noon Evening Bedtime   albuterol  1.25 mg/3 mL Nebu  Commonly known as:  ACCUNEB     Your last dose was:      Your next dose is:          USE 1 VIAL IN NEBULIZER EVERY 6 HOURS AS NEEDED FOR  SHORTNESS  OF  BREATH,  WHEEZING                  cetirizine 10 mg tablet  Commonly known as:  ZYRTEC    Your last dose was:      Your next dose is:          Take 1 Tab by mouth daily.   10 mg                 cloNIDine HCL 0.1 mg tablet  Commonly known as:  CATAPRES    Your last dose was:      Your next dose is:          Take 3 Tabs by mouth two (2) times a day.   0.3 mg                 dilTIAZem ER 240 mg capsule  Commonly known as:  CARDIZEM CD    Your last dose was:      Your next dose is:          Take 1 Cap by mouth daily.   240 mg                 fluticasone  propion-salmeteroL 100-50 mcg/dose diskus inhaler  Commonly known as:  Advair Diskus    Your last dose was:      Your next dose is:          Take 1 Puff by inhalation two (2) times a day.   1 Puff                 fluticasone  propionate 50 mcg/actuation nasal spray  Commonly known as:  FLONASE    Your  last dose was:      Your next dose is:  2 Sprays by Both Nostrils route daily.   2 Spray                 gabapentin 300 mg capsule  Commonly known as:  NEURONTIN    Your last dose was:      Your next dose is:          Take 1 Cap by mouth three (3) times daily. Max Daily Amount: 900 mg.   300 mg                 guaiFENesin  ER 600 mg ER tablet  Commonly known as:  MUCINEX     Your last dose was:      Your next dose is:          Take 1 Tab by mouth every twelve (12) hours.   600 mg                 Lantus  Solostar U-100 Insulin  100 unit/mL (3 mL) Inpn  Generic drug:  insulin  glargine    Your last dose was:      Your next dose is:          INJECT 32 UNITS SUBCUTANEOUSLY  NIGHTLY                  losartan  50 mg tablet  Commonly known as:  COZAAR     Your last dose was:      Your next dose is:          Take 1 Tab by mouth daily.   50 mg                 magnesium  oxide 400 mg tablet  Commonly known as:  MAG-OX    Your last dose was:      Your next dose is:          Take 400 mg by mouth daily.   400 mg                 metFORMIN ER 500 mg tablet  Commonly known as:  GLUCOPHAGE XR    Your last dose was:      Your next dose is:          TAKE 1 TABLET TWICE DAILY                  montelukast  10 mg tablet  Commonly known as:  SINGULAIR     Your last dose was:      Your next dose is:          TAKE 1 TABLET EVERY DAY                  polyethylene glycol 17 gram packet  Commonly known as:  MIRALAX     Your last dose was:      Your next dose is:          Take 1 Packet by mouth daily.   17 g                 QUEtiapine  SR 150 mg sr tablet  Commonly known as:  SEROquel  XR    Your last dose was:      Your next dose is:          Take 150 mg by mouth nightly as needed.   150 mg                 rosuvastatin  5 mg tablet  Commonly known as:  CRESTOR     Your last dose was:      Your next dose is:          TAKE 1 TABLET BY MOUTH NIGHTLY                  spironolactone  100 mg tablet  Commonly known as:  ALDACTONE     Your last dose was:       Your next dose is:          TAKE 1 TABLET EVERY DAY                  tiotropium bromide 2.5 mcg/actuation inhaler  Commonly known as:  SPIRIVA RESPIMAT    Your last dose was:      Your next dose is:          Take 1 Puff by inhalation daily.   1 Puff                 traZODone 50 mg tablet  Commonly known as:  DESYREL    Your last dose was:      Your next dose is:          Take 1 Tab by mouth nightly as needed for Sleep.   50 mg                       Where to Get Your Medications      These medications were sent to [facility] [address] [place], [place] - [address]  [address] [place], [place] [address]    Phone:  [phone]    butalbital-acetaminophen -caff 50-300-40 mg per capsule   ondansetron  4 mg disintegrating tablet         Discussed meds and side effects w/ pt.

## 2019-03-13 NOTE — ED Notes (Signed)
Pt out for CT.

## 2019-03-13 NOTE — ED Notes (Signed)
Pt presents to ED c/o a headache x 3 days, SOB that started yesterday, and hypotension this morning. Pt normotensive in triage. Denies neurological changes or viion changes. Denies N/V/D

## 2019-03-27 ENCOUNTER — Encounter

## 2019-04-01 ENCOUNTER — Emergency Department: Admit: 2019-04-01 | Payer: MEDICARE | Primary: Physician Assistant

## 2019-04-01 ENCOUNTER — Inpatient Hospital Stay
Admit: 2019-04-01 | Discharge: 2019-04-01 | Disposition: A | Discharge status: Home / Self Care | Payer: MEDICARE | Attending: Emergency Medicine

## 2019-04-01 DIAGNOSIS — R1031 Right lower quadrant pain: Secondary | ICD-10-CM

## 2019-04-01 MED ORDER — METHOCARBAMOL 750 MG TAB
750 mg | ORAL_TABLET | Freq: Four times a day (QID) | ORAL | 0 refills | Status: DC
Start: 2019-04-01 — End: 2019-04-10

## 2019-04-01 MED ORDER — OXYCODONE-ACETAMINOPHEN 5 MG-325 MG TAB
5-325 mg | ORAL | Status: AC
Start: 2019-04-01 — End: 2019-04-01
  Administered 2019-04-01: 17:00:00 via ORAL

## 2019-04-01 MED ORDER — IBUPROFEN 600 MG TAB
600 mg | ORAL_TABLET | Freq: Four times a day (QID) | ORAL | 0 refills | Status: DC | PRN
Start: 2019-04-01 — End: 2019-07-05

## 2019-04-01 MED FILL — OXYCODONE-ACETAMINOPHEN 5 MG-325 MG TAB: 5-325 mg | ORAL | Qty: 1

## 2019-04-01 NOTE — ED Triage Notes (Signed)
Pt complains of right hip pain for a couple days. Points to groin area. States it feels like when she had to have her left hip replaced.

## 2019-04-01 NOTE — ED Notes (Signed)
Pt has non-traumatic pain to her right hip that started 2 days ago while laying in bed. Pt states she has only taken ASA for the pain, last was "about 2 hours ago". Pt states she had her left hip replaced "a couple years ago, but that doc moved to [place] and I dont have a doc to see me for this hip now. I havent called my PCP yet for my right hip". Pt states the pain does radiated down her leg, but denies any numbness or tingling. Denies any pain to her back. Denies any loss of bowel or bladder.

## 2019-04-01 NOTE — ED Notes (Signed)
2:08 PM  04/01/19     Discharge instructions given to [name] [name] (name) with verbalization of understanding. Patient accompanied by spouse.  Patient discharged with the following prescriptions motrin, robaxin. Patient discharged to home (destination).      Chassidy A Mobley, RN

## 2019-04-01 NOTE — ED Notes (Signed)
Pt taken to Xray

## 2019-04-01 NOTE — ED Notes (Signed)
Pt moved to bed 13, asked to undress from waste down for more comprehensive eval. Provided gown and sheet.

## 2019-04-01 NOTE — ED Notes (Signed)
MD in to see pt

## 2019-04-01 NOTE — ED Provider Notes (Signed)
[facility]  Emergency Department Treatment Report        Patient: [name] [name] Age: 68 y.o. Sex: female    Date of Birth: August 11, 1950 Admit Date: 04/01/2019 PCP: Ernst Breach   MRN: [id number]  CSN: [id number]  Attending: Lytle Michaels   Room: ER13/ER13 Time Dictated: 6:27 PM APP: Chrystine Oiler       Chief Complaint   Chief Complaint   Patient presents with   ??? Hip Pain       History of Present Illness   This is a 68 y.o. female relates 3 days of pain in the right inguinal crease and right hip with range of motion to the right hip.  She denies trauma.  She has had a right hip replacement in the past and has had some component of chronic pain here but this is worse in the last 3 days.  Patient denies fevers or chills or any other symptoms and presents for further evaluation.    Patient's significant other bedside states that she has a history of a hernia  repair that he thinks is in this region as well.    Review of Systems     Constitutional: No fever, chills, or weight loss  Respiratory: No cough, shortness of breath or wheezing.  Cardiovascular: No chest pain/pressure or palpitations.  Gastrointestinal: No vomiting, diarrhea or abdominal pain.  Genitourinary: No painful urination, frequency, or urgency.  Musculoskeletal: as above. No other joint pain or swelling.  Integumentary: No rashes.  Neurological: No headaches or other sensory or motor symptoms.  No loss of bowel or bladder control, saddle paresthesias, or numbness or weakness to lower extremities.      Past Medical/Surgical History     Past Medical History:   Diagnosis Date   ??? Arthritis    ??? 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 DM2 (diabetes mellitus, type 2) ([facility])    ??? GERD (gastroesophageal reflux disease)    ??? Hepatitis C     HEP C   ??? HTN (hypertension)    ??? Lung nodule, multiple      (CT 10/2016) Small left upper lobe and 1.7 x 1.6 cm left lower lobe nodules not significantly changed from 07/23/2016 and 03/22/2016   ??? Marijuana use    ??? PTSD (post-traumatic stress disorder)     lived through the [place]   ??? Thoracic ascending aortic aneurysm ([place])     4.4cm noted on CT Chest (10/2016)   ??? Thyroid nodule     2.5 cm stable right thyroid nodule (CT 10/2016)     Past Surgical History:   Procedure Laterality Date   ??? HX ENDOSCOPY     ??? HX HERNIA REPAIR  2019    x2   ??? HX HIP REPLACEMENT Right 01/29/2015   ??? HX HIP REPLACEMENT     ??? HX HYSTERECTOMY  2010    hysterectomy       Social History     Social History     Socioeconomic History   ??? Marital status: MARRIED     Spouse name: Not on file   ??? Number of children: Not on file   ??? Years of education: Not on file   ??? Highest education level: Not on file   Occupational History   ???  Not on file   Social Needs   ??? Financial resource strain: Not on file   ??? Food insecurity     Worry: Not on file     Inability: Not on file   ??? Transportation needs     Medical: Not on file     Non-medical: Not on file   Tobacco Use   ??? Smoking status: Former Smoker   ??? Smokeless tobacco: Never Used   ??? Tobacco comment: reported as quit smoking around 1990s per spouse   Substance and Sexual Activity   ??? Alcohol use: No   ??? Drug use: Yes     Types: Marijuana   ??? Sexual activity: Never   Lifestyle   ??? Physical activity     Days per week: Not on file     Minutes per session: Not on file   ??? Stress: Not on file   Relationships   ??? Social Wellsite geologist on phone: Not on file     Gets together: Not on file     Attends religious service: Not on file     Active member of club or organization: Not on file     Attends meetings of clubs or organizations: Not on file     Relationship status: Not on file   ??? Intimate partner violence     Fear of current or ex partner: Not on file     Emotionally abused: Not on file     Physically abused: Not on file      Forced sexual activity: Not on file   Other Topics Concern   ??? Not on file   Social History Narrative   ??? Not on file       Family History     Family History   Problem Relation Age of Onset   ??? Hypertension Mother    ??? Other Mother         OSA   ??? Cancer Father         suspected leukemia per pt's spouse   ??? Cancer Maternal Aunt    ??? Alcohol abuse Maternal Grandfather        Current Medications     Prior to Admission Medications   Prescriptions Last Dose Informant Patient Reported? Taking?   LANTUS SOLOSTAR U-100 INSULIN 100 unit/mL (3 mL) inpn   No No   Sig: INJECT 32 UNITS SUBCUTANEOUSLY  NIGHTLY   QUEtiapine SR (SEROQUEL XR) 150 mg sr tablet   Yes No   Sig: Take 150 mg by mouth nightly as needed.   albuterol (ACCUNEB) 1.25 mg/3 mL nebu   No No   Sig: USE 1 VIAL IN NEBULIZER EVERY 6 HOURS AS NEEDED FOR  SHORTNESS  OF  BREATH,  WHEEZING   butalbital-acetaminophen-caff (FIORICET) 50-300-40 mg per capsule   No No   Sig: Take 1 Cap by mouth every four (4) hours as needed for Headache.   cetirizine (ZYRTEC) 10 mg tablet   No No   Sig: Take 1 Tab by mouth daily.   cloNIDine HCl (CATAPRES) 0.1 mg tablet   No No   Sig: Take 3 Tabs by mouth two (2) times a day.   dilTIAZem CD (CARDIZEM CD) 240 mg ER capsule   No No   Sig: Take 1 Cap by mouth daily.   fluticasone propion-salmeterol (ADVAIR DISKUS) 100-50 mcg/dose diskus inhaler   No No   Sig: Take 1 Puff by inhalation two (2) times a  day.   fluticasone propionate (FLONASE) 50 mcg/actuation nasal spray   No No   Sig: 2 Sprays by Both Nostrils route daily.   gabapentin (NEURONTIN) 300 mg capsule   No No   Sig: Take 1 Cap by mouth three (3) times daily. Max Daily Amount: 900 mg.   guaiFENesin ER (MUCINEX) 600 mg ER tablet   No No   Sig: Take 1 Tab by mouth every twelve (12) hours.   losartan (COZAAR) 50 mg tablet   No No   Sig: Take 1 Tab by mouth daily.   magnesium oxide (MAG-OX) 400 mg tablet   Yes No   Sig: Take 400 mg by mouth daily.    metFORMIN ER (GLUCOPHAGE XR) 500 mg tablet   No No   Sig: TAKE 1 TABLET TWICE DAILY   montelukast (SINGULAIR) 10 mg tablet   No No   Sig: TAKE 1 TABLET EVERY DAY   ondansetron (Zofran ODT) 4 mg disintegrating tablet   No No   Sig: Take 1 Tab by mouth every eight (8) hours as needed for Nausea. Indications: nausea   polyethylene glycol (MIRALAX) 17 gram packet   No No   Sig: Take 1 Packet by mouth daily.   Patient taking differently: Take 17 g by mouth daily as needed for Constipation.   rosuvastatin (CRESTOR) 5 mg tablet   No No   Sig: TAKE 1 TABLET BY MOUTH NIGHTLY   spironolactone (ALDACTONE) 100 mg tablet   No No   Sig: TAKE 1 TABLET EVERY DAY   tiotropium bromide (SPIRIVA RESPIMAT) 2.5 mcg/actuation inhaler   No No   Sig: Take 1 Puff by inhalation daily.   traZODone (DESYREL) 50 mg tablet   No No   Sig: Take 1 Tab by mouth nightly as needed for Sleep.      Facility-Administered Medications: None       Allergies     Allergies   Allergen Reactions   ??? Chocolate [Cocoa] Sneezing     Confirms allergy but reports "I still eat it"       Physical Exam     ED Triage Vitals [04/01/19 1213]   ED Encounter Vitals Group      BP 114/80      Pulse (Heart Rate) 95      Resp Rate 18      Temp 97.3 ??F (36.3 ??C)      Temp src       O2 Sat (%) 97 %      Weight 196 lb      Height      Constitutional: Patient appears well developed and well nourished. Appearance and behavior are age and situation appropriate..  Neck: supple, non tender, symmetrical, no masses or meningismus.   Respiratory: lungs clear to auscultation, nonlabored respirations. No tachypnea or accessory muscle use.  Cardiovascular: heart regular rate and rhythm without murmur rubs or gallops.   Calves soft and non-tender. Distal pulses 2+ and equal bilaterally.  No peripheral edema or significant variscosities.    Gastrointestinal:  Abdomen soft, nontender without complaint of pain to palpation whatsoever including the right lower abdomen over McBurney's point.    Musculoskeletal: Pain with active but none with passive range of motion to right hip.  Full range of motion is intact on exam.  She has tenderness to the right inguinal crease with no appreciable lymphadenopathy, mild right trochanteric hip tenderness.  No other tender to palpation on range of motion of the 4  extremities including the right calf or thigh otherwise.  Integumentary: warm and dry without rashes or lesions  Neurologic: alert and oriented, Sensation intact, motor strength equal and symmetric.  No facial asymmetry or dysarthria.           Impression and Management Plan     Acute on chronic right hip pain, will obtain x-ray to evaluate for any acute lesions.  No calf or thigh tenderness concerning for DVT.  She is neurovascularly intact.  She has no abdominal tenderness concerning for referred pain from her abdomen such as appendicitis.    This patient was evaluated in the emergency department for symptoms described in the history of present illness. They were evaluated in the context of global COVID-19 pandemic, which necessitated consideration of the patient may be at  risk for infection with the SARS-COV-2 virus that causes COVID-19. Institutional protocols and algorithms that pertain to the evaluation of patients at risk for COVID-19 are in a state of rapid change based on the information released by regulatory bodies including the CDC and federal state organizations.  These policies and algorithms were followed during the patient's care in the emergency department.          Procedures      Diagnostic Studies     LAB/IMAGING:   Results for orders placed or performed during the hospital encounter of 04/01/19   XR HIP RT W OR WO PELV 2-3 VWS    Narrative    INDICATION:  non-traumatic right hip pain        EXAMINATION:  XR HIP RT W OR WO PELV 2-3 VWS    COMPARISON:  11/17/2015    FINDINGS:  Total right hip prosthesis in good position. No evidence of a fracture. Moderate  degeneration of the left hip.       Impression    IMPRESSION:  1. Total right hip prosthesis in good position.  2. Negative for fracture.  3. Moderate osteoarthritis of the left hip.         EKG      Medical Decision Making/ ED Course     Pt remained stable in the emergency department, did not develop other symptoms, informed of results, and discharged in stable condition.    Meds given:  Medications   oxyCODONE-acetaminophen (PERCOCET) 5-325 mg per tablet 1 Tab (1 Tab Oral Given 04/01/19 1251)       Final Diagnosis         ICD-10-CM ICD-9-CM   1. Groin pain, right  R10.31 789.03   2. Hip pain  M25.559 719.45       Disposition     Disposition and plan  Prescription for muscle relaxants Robaxin, NSAIDs, orthopedic follow-up, return precautions.  Patient was discharged home in stable condition with discharge instructions on the same.     Return to the ER if condition worsens or new symptoms develop.   Follow up with primary care as discussed.     The patient was personally evaluated by myself and [facility], Junius ArgyleOBERT EDWARD who agrees with the above assessment and plan.    Dragon medical dictation software was used for portions of this report. Unintended errors may occur.     Anibal HendersonBryan Shilee Biggs, MPA, PA-C  April 01, 2019    My signature above authenticates this document and my orders, the final    diagnosis (es), discharge prescription (s), and instructions in the Epic    record.  If you have any questions please contact [phone].  Nursing notes have been reviewed by the physician/ advanced practice    Clinician.

## 2019-04-01 NOTE — ED Notes (Signed)
2:08 PM  04/01/19     Discharge instructions given to [name] [name] (name) with verbalization of understanding. Patient accompanied by spouse.  Patient discharged with the following prescriptions motrin, robaxin. Patient discharged to home (destination).      Gretta Began, RN

## 2019-04-01 NOTE — ED Notes (Signed)
Pt complains of right hip pain for a couple days. Points to groin area. States it feels like when she had to have her left hip replaced.

## 2019-04-01 NOTE — ED Notes (Signed)
 Pt has non-traumatic pain to her right hip that started 2 days ago while laying in bed. Pt states she has only taken ASA for the pain, last was about 2 hours ago. Pt states she had her left hip replaced a couple years ago, but that doc moved to [place] and I dont have a doc to see me for this hip now. I havent called my PCP yet for my right hip. Pt states the pain does radiated down her leg, but denies any numbness or tingling. Denies any pain to her back. Denies any loss of bowel or bladder.

## 2019-04-01 NOTE — ED Provider Notes (Signed)
ED Provider Notes by Renella Cunas, PA-C at 04/01/19 1416                Author: Renella Cunas, PA-C  Service: Emergency Medicine  Author Type: Physician Assistant       Filed: 04/01/19 1829  Date of Service: 04/01/19 1416  Status: Attested           Editor: Renella Cunas, PA-C (Physician Assistant)  Cosigner: Posey Pronto, MD at 04/02/19 [phone]          Attestation signed by Posey Pronto, MD at 04/02/19 [phone]          I interviewed and examined the patient. I discussed with the mid-level provider agree with her evaluation and plan as documented here.I have reviewed and agree  with all pertinent clinical information including history, physical exam, labs, radiographic studies and the plan.  I have also reviewed and agree with the medications, allergies and past medical history sections for this patient.      Lower extremity neurovascularly intact on examination.  Her abdomen is soft and entirely nontender.  I am unable to appreciate any hernias.  Mild tenderness in the inguinal crease.  I suspect most likely a pulled muscle.                                    [facility]   Emergency Department Treatment Report                Patient: [name] [name]  Age: 68 y.o.  Sex: female          Date of Birth: [birth year]  Admit Date: 04/01/2019  PCP: Marjory Lies         MRN: [id number]   CSN: [id number]   Attending: Dionisio David         Room: ER13/ER13  Time Dictated: 6:27 PM  APP: Lockie Pares           Chief Complaint      Chief Complaint       Patient presents with        ?  Hip Pain             History of Present Illness     This is a 68 y.o.  female relates 3 days of pain in the right inguinal crease and right hip with range of motion to the right hip.  She denies trauma.  She has had a right hip replacement in the past and has had some  component of chronic pain here but this is worse in the last 3 days.  Patient denies fevers or chills or any other symptoms  and presents for further evaluation.      Patient's significant other bedside states that she has a history of a hernia  repair that he thinks is in this region as well.        Review of Systems        Constitutional: No fever, chills, or weight loss   Respiratory: No cough, shortness of breath or wheezing.   Cardiovascular: No chest pain/pressure or palpitations.   Gastrointestinal: No vomiting, diarrhea or abdominal pain.   Genitourinary: No painful urination, frequency, or urgency.   Musculoskeletal: as above. No other joint pain or swelling.   Integumentary: No rashes.   Neurological: No headaches or other sensory or motor symptoms.  No  loss of bowel or bladder control, saddle paresthesias, or numbness or weakness to lower extremities.           Past Medical/Surgical History          Past Medical History:        Diagnosis  Date         ?  Arthritis       ?  Chronic pain syndrome            related to R hip replacement         ?  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  DM2 (diabetes mellitus, type 2) (HCC)       ?  GERD (gastroesophageal reflux disease)       ?  Hepatitis C            HEP C         ?  HTN (hypertension)       ?  Lung nodule, multiple            (CT 10/2016) Small left upper lobe and 1.7 x 1.6 cm left lower lobe nodules not significantly changed from 07/23/2016 and 03/22/2016         ?  Marijuana use       ?  PTSD (post-traumatic stress disorder)            lived through the [place]         ?  Thoracic ascending aortic aneurysm (HCC)            4.4cm noted on CT Chest (10/2016)         ?  Thyroid nodule            2.5 cm stable right thyroid nodule (CT 10/2016)          Past Surgical History:         Procedure  Laterality  Date          ?  HX ENDOSCOPY         ?  HX HERNIA REPAIR    2019          x2          ?  HX HIP REPLACEMENT  Right  01/29/2015     ?  HX HIP REPLACEMENT         ?  HX HYSTERECTOMY    2010           hysterectomy             Social History          Social History          Socioeconomic History         ?  Marital status:  MARRIED              Spouse name:  Not on file         ?  Number of children:  Not on file     ?  Years of education:  Not on file     ?  Highest education level:  Not on file       Occupational History        ?  Not on file       Social Needs         ?  Financial resource strain:  Not on file        ?  Food insecurity              Worry:  Not on file         Inability:  Not on file        ?  Transportation needs              Medical:  Not on file              Non-medical:  Not on file       Tobacco Use         ?  Smoking status:  Former Smoker     ?  Smokeless tobacco:  Never Used        ?  Tobacco comment: reported as quit smoking around 1990s per spouse       Substance and Sexual Activity         ?  Alcohol use:  No     ?  Drug use:  Yes              Types:  Marijuana         ?  Sexual activity:  Never       Lifestyle        ?  Physical activity              Days per week:  Not on file         Minutes per session:  Not on file         ?  Stress:  Not on file       Relationships        ?  Social Health visitor on phone:  Not on file         Gets together:  Not on file         Attends religious service:  Not on file         Active member of club or organization:  Not on file         Attends meetings of clubs or organizations:  Not on file         Relationship status:  Not on file        ?  Intimate partner violence              Fear of current or ex partner:  Not on file         Emotionally abused:  Not on file         Physically abused:  Not on file         Forced sexual activity:  Not on file        Other Topics  Concern        ?  Not on file       Social History Narrative        ?  Not on file             Family History          Family History         Problem  Relation  Age of Onset          ?  Hypertension  Mother       ?  Other  Mother  OSA          ?   Cancer  Father                suspected leukemia per pt's spouse          ?  Cancer  Maternal Aunt            ?  Alcohol abuse  Maternal Grandfather               Current Medications          Prior to Admission Medications     Prescriptions  Last Dose  Informant  Patient Reported?  Taking?      LANTUS SOLOSTAR U-100 INSULIN 100 unit/mL (3 mL) inpn      No  No      Sig: INJECT 32 UNITS SUBCUTANEOUSLY  NIGHTLY      QUEtiapine SR (SEROQUEL XR) 150 mg sr tablet      Yes  No      Sig: Take 150 mg by mouth nightly as needed.      albuterol (ACCUNEB) 1.25 mg/3 mL nebu      No  No      Sig: USE 1 VIAL IN NEBULIZER EVERY 6 HOURS AS NEEDED FOR  SHORTNESS  OF  BREATH,  WHEEZING      butalbital-acetaminophen-caff (FIORICET) 50-300-40 mg per capsule      No  No      Sig: Take 1 Cap by mouth every four (4) hours as needed for Headache.      cetirizine (ZYRTEC) 10 mg tablet      No  No      Sig: Take 1 Tab by mouth daily.      cloNIDine HCl (CATAPRES) 0.1 mg tablet      No  No      Sig: Take 3 Tabs by mouth two (2) times a day.      dilTIAZem CD (CARDIZEM CD) 240 mg ER capsule      No  No      Sig: Take 1 Cap by mouth daily.      fluticasone propion-salmeterol (ADVAIR DISKUS) 100-50 mcg/dose diskus inhaler      No  No      Sig: Take 1 Puff by inhalation two (2) times a day.      fluticasone propionate (FLONASE) 50 mcg/actuation nasal spray      No  No      Sig: 2 Sprays by Both Nostrils route daily.      gabapentin (NEURONTIN) 300 mg capsule      No  No      Sig: Take 1 Cap by mouth three (3) times daily. Max Daily Amount: 900 mg.      guaiFENesin ER (MUCINEX) 600 mg ER tablet      No  No      Sig: Take 1 Tab by mouth every twelve (12) hours.      losartan (COZAAR) 50 mg tablet      No  No      Sig: Take 1 Tab by mouth daily.      magnesium oxide (MAG-OX) 400 mg tablet      Yes  No      Sig: Take 400 mg by mouth daily.      metFORMIN ER (GLUCOPHAGE XR) 500 mg tablet      No  No      Sig: TAKE 1 TABLET TWICE DAILY  montelukast  (SINGULAIR) 10 mg tablet      No  No      Sig: TAKE 1 TABLET EVERY DAY      ondansetron (Zofran ODT) 4 mg disintegrating tablet      No  No      Sig: Take 1 Tab by mouth every eight (8) hours as needed for Nausea. Indications: nausea      polyethylene glycol (MIRALAX) 17 gram packet      No  No      Sig: Take 1 Packet by mouth daily.      Patient taking differently: Take 17 g by mouth daily as needed for Constipation.      rosuvastatin (CRESTOR) 5 mg tablet      No  No      Sig: TAKE 1 TABLET BY MOUTH NIGHTLY      spironolactone (ALDACTONE) 100 mg tablet      No  No      Sig: TAKE 1 TABLET EVERY DAY      tiotropium bromide (SPIRIVA RESPIMAT) 2.5 mcg/actuation inhaler      No  No      Sig: Take 1 Puff by inhalation daily.      traZODone (DESYREL) 50 mg tablet      No  No      Sig: Take 1 Tab by mouth nightly as needed for Sleep.               Facility-Administered Medications: None             Allergies          Allergies        Allergen  Reactions         ?  Chocolate [Cocoa]  Sneezing             Confirms allergy but reports "I still eat it"             Physical Exam          ED Triage Vitals [04/01/19 1213]     ED Encounter Vitals Group           BP  114/80        Pulse (Heart Rate)  95        Resp Rate  18        Temp  97.3 ??F (36.3 ??C)        Temp src          O2 Sat (%)  97 %        Weight  196 lb           Height          Constitutional: Patient appears well developed and well nourished. Appearance and behavior are age and situation appropriate..   Neck: supple, non tender, symmetrical, no masses or meningismus.    Respiratory: lungs clear to auscultation, nonlabored respirations. No tachypnea or accessory muscle use.   Cardiovascular: heart regular rate and rhythm without murmur rubs or gallops.    Calves soft and non-tender. Distal pulses 2+ and equal bilaterally.  No peripheral edema or significant variscosities.     Gastrointestinal:  Abdomen soft, nontender without complaint of pain to palpation  whatsoever including the right lower abdomen over McBurney's  point.    Musculoskeletal: Pain with active but none with passive range of motion to right hip.  Full range of motion is intact on exam.  She has tenderness  to the  right inguinal crease with no appreciable lymphadenopathy, mild right trochanteric hip tenderness.  No other tender to palpation on range of motion of the 4 extremities including the right calf or thigh otherwise.   Integumentary: warm and dry without rashes or lesions   Neurologic: alert and oriented, Sensation intact, motor strength equal and symmetric.  No facial asymmetry or dysarthria.                  Impression and Management Plan        Acute on chronic right hip pain, will obtain x-ray to evaluate for any acute lesions.  No calf or thigh tenderness concerning for DVT.  She is neurovascularly intact.  She has no abdominal tenderness concerning for referred pain from her abdomen such  as appendicitis.      This patient was evaluated in the emergency department for symptoms described in the history of present illness. They were evaluated in the context of global COVID-19 pandemic, which necessitated consideration of the patient may be at  risk for infection  with the SARS-COV-2 virus that causes COVID-19. Institutional protocols and algorithms that pertain to the evaluation of patients at risk for COVID-19 are in a state of rapid change based on the information released by regulatory bodies including the  CDC and federal state organizations.  These policies and algorithms were followed during the patient's care in the emergency department.              Procedures           Diagnostic Studies        LAB/IMAGING:      Results for orders placed or performed during the hospital encounter of 04/01/19     XR HIP RT W OR WO PELV 2-3 VWS          Narrative          INDICATION:   non-traumatic right hip pain          EXAMINATION:   XR HIP RT W OR WO PELV 2-3 VWS      COMPARISON:   11/17/2015       FINDINGS:   Total right hip prosthesis in good position. No evidence of a fracture. Moderate   degeneration of the left hip.             Impression          IMPRESSION:   1. Total right hip prosthesis in good position.   2. Negative for fracture.   3. Moderate osteoarthritis of the left hip.              EKG           Medical Decision Making/ ED Course        Pt remained stable in the emergency department, did not develop other symptoms, informed of results, and discharged in stable condition.      Meds given:     Medications       oxyCODONE-acetaminophen (PERCOCET) 5-325 mg per tablet 1 Tab (1 Tab Oral Given 04/01/19 1251)             Final Diagnosis                    ICD-10-CM  ICD-9-CM          1.  Groin pain, right   R10.31  789.03          2.  Hip pain   M25.559  719.45  Disposition        Disposition and plan   Prescription for muscle relaxants Robaxin, NSAIDs, orthopedic follow-up, return precautions.  Patient was discharged home in stable condition with discharge instructions on the same.       Return to the ER if condition worsens or new symptoms develop.    Follow up with primary care as discussed.       The patient was personally evaluated by myself and [facility] Junius ArgyleOBERT EDWARD who agrees with the above assessment and plan.      Dragon medical dictation software was used for portions of this report. Unintended errors may occur.       Anibal HendersonBryan Orlen Leedy, MPA, PA-C   April 01, 2019      My signature above authenticates this document and my orders, the final     diagnosis (es), discharge prescription (s), and instructions in the Epic     record.   If you have any questions please contact [phone].       Nursing notes have been reviewed by the physician/ advanced practice     Clinician.

## 2019-04-01 NOTE — ED Notes (Signed)
Pt moved to bed 13, asked to undress from waste down for more comprehensive eval. Provided gown and sheet.

## 2019-04-08 ENCOUNTER — Encounter: Payer: PRIVATE HEALTH INSURANCE | Attending: Urology | Primary: Physician Assistant

## 2019-04-09 ENCOUNTER — Inpatient Hospital Stay
Admit: 2019-04-09 | Discharge: 2019-04-10 | Disposition: A | Discharge status: Home / Self Care | Payer: MEDICARE | Attending: Emergency Medicine

## 2019-04-09 DIAGNOSIS — M25551 Pain in right hip: Secondary | ICD-10-CM

## 2019-04-09 NOTE — ED Notes (Signed)
Pt laying on the left side moaning in pain occurring in the rt groin and radiating to the rt hip and thigh

## 2019-04-09 NOTE — ED Triage Notes (Signed)
Left groin pain--  Pills that were given are not working

## 2019-04-09 NOTE — ED Notes (Signed)
Lab called about requested bloodworks

## 2019-04-09 NOTE — ED Notes (Signed)
Pt c/o groin pain rates a 15 on pain scale she is tearful and states unable to walk at this time

## 2019-04-09 NOTE — ED Notes (Signed)
Left groin pain--  Pills that were given are not working

## 2019-04-09 NOTE — ED Notes (Signed)
Lab called about requested bloodworks

## 2019-04-09 NOTE — ED Notes (Signed)
Pt c/o groin pain rates a 15 on pain scale she is tearful and states unable to walk at this time

## 2019-04-09 NOTE — ED Notes (Signed)
Pt laying on the left side moaning in pain occurring in the rt groin and radiating to the rt hip and thigh

## 2019-04-10 LAB — CBC WITH AUTO DIFFERENTIAL
Basophils %: 0.3 % (ref 0–3)
Eosinophils %: 0.8 % (ref 0–5)
Hematocrit: 44.7 % (ref 37.0–50.0)
Hemoglobin: 14.6 gm/dl (ref 13.0–17.2)
Immature Granulocytes: 0.3 % (ref 0.0–3.0)
Lymphocytes %: 21.8 % — ABNORMAL LOW (ref 28–48)
MCH: 29.5 pg (ref 25.4–34.6)
MCHC: 32.7 gm/dl (ref 30.0–36.0)
MCV: 90.3 fL (ref 80.0–98.0)
MPV: 11.6 fL — ABNORMAL HIGH (ref 6.0–10.0)
Monocytes %: 7.4 % (ref 1–13)
Neutrophils %: 69.4 % — ABNORMAL HIGH (ref 34–64)
Nucleated RBCs: 0 (ref 0–0)
Platelets: 256 10*3/uL (ref 140–450)
RBC: 4.95 M/uL (ref 3.60–5.20)
RDW-SD: 44.7 (ref 36.4–46.3)
WBC: 13.6 10*3/uL — ABNORMAL HIGH (ref 4.0–11.0)

## 2019-04-10 LAB — COMPREHENSIVE METABOLIC PANEL
ALT: 17 U/L (ref 12–78)
AST: 9 U/L — ABNORMAL LOW (ref 15–37)
Albumin: 4.1 gm/dl (ref 3.4–5.0)
Alkaline Phosphatase: 103 U/L (ref 45–117)
Anion Gap: 3 mmol/L — ABNORMAL LOW (ref 5–15)
BUN: 19 mg/dl (ref 7–25)
CO2: 32 mEq/L (ref 21–32)
Calcium: 10.3 mg/dl — ABNORMAL HIGH (ref 8.5–10.1)
Chloride: 103 mEq/L (ref 98–107)
Creatinine: 1.1 mg/dl (ref 0.6–1.3)
EGFR IF NonAfrican American: 53
GFR African American: >60
Glucose: 118 mg/dl — ABNORMAL HIGH (ref 74–106)
Potassium: 4.9 mEq/L (ref 3.5–5.1)
Sodium: 138 mEq/L (ref 136–145)
Total Bilirubin: 0.3 mg/dl (ref 0.2–1.0)
Total Protein: 8.6 gm/dl — ABNORMAL HIGH (ref 6.4–8.2)

## 2019-04-10 LAB — C-REACTIVE PROTEIN: CRP: <2.9 mg/L (ref 0.0–2.9)

## 2019-04-10 LAB — SEDIMENTATION RATE: Sed Rate: 28 mm/Hr (ref 0–30)

## 2019-04-10 LAB — METABOLIC PANEL, COMPREHENSIVE
ALT (SGPT): 17 U/L (ref 12–78)
AST (SGOT): 9 U/L — ABNORMAL LOW (ref 15–37)
Albumin: 4.1 gm/dl (ref 3.4–5.0)
Alk. phosphatase: 103 U/L (ref 45–117)
Anion gap: 3 mmol/L — ABNORMAL LOW (ref 5–15)
BUN: 19 mg/dl (ref 7–25)
Bilirubin, total: 0.3 mg/dl (ref 0.2–1.0)
CO2: 32 mEq/L (ref 21–32)
Calcium: 10.3 mg/dl — ABNORMAL HIGH (ref 8.5–10.1)
Chloride: 103 mEq/L (ref 98–107)
Creatinine: 1.1 mg/dl (ref 0.6–1.3)
GFR est AA: >60
GFR est non-AA: 53
Glucose: 118 mg/dl — ABNORMAL HIGH (ref 74–106)
Potassium: 4.9 mEq/L (ref 3.5–5.1)
Protein, total: 8.6 gm/dl — ABNORMAL HIGH (ref 6.4–8.2)
Sodium: 138 mEq/L (ref 136–145)

## 2019-04-10 LAB — CBC WITH AUTOMATED DIFF
BASOPHILS: 0.3 % (ref 0–3)
EOSINOPHILS: 0.8 % (ref 0–5)
HCT: 44.7 % (ref 37.0–50.0)
HGB: 14.6 gm/dl (ref 13.0–17.2)
IMMATURE GRANULOCYTES: 0.3 % (ref 0.0–3.0)
LYMPHOCYTES: 21.8 % — ABNORMAL LOW (ref 28–48)
MCH: 29.5 pg (ref 25.4–34.6)
MCHC: 32.7 gm/dl (ref 30.0–36.0)
MCV: 90.3 fL (ref 80.0–98.0)
MONOCYTES: 7.4 % (ref 1–13)
MPV: 11.6 fL — ABNORMAL HIGH (ref 6.0–10.0)
NEUTROPHILS: 69.4 % — ABNORMAL HIGH (ref 34–64)
NRBC: 0 (ref 0–0)
PLATELET: 256 10*3/uL (ref 140–450)
RBC: 4.95 M/uL (ref 3.60–5.20)
RDW-SD: 44.7 (ref 36.4–46.3)
WBC: 13.6 10*3/uL — ABNORMAL HIGH (ref 4.0–11.0)

## 2019-04-10 LAB — C REACTIVE PROTEIN, QT: C-Reactive protein: <2.9 mg/L (ref 0.0–2.9)

## 2019-04-10 LAB — SED RATE (ESR): Sed rate (ESR): 28 mm/h (ref 0–30)

## 2019-04-10 MED ORDER — METHOCARBAMOL 500 MG TAB
500 mg | ORAL_TABLET | Freq: Four times a day (QID) | ORAL | 0 refills | Status: DC
Start: 2019-04-10 — End: 2019-07-05

## 2019-04-10 MED ORDER — OXYCODONE-ACETAMINOPHEN 5 MG-325 MG TAB
5-325 mg | ORAL | Status: AC
Start: 2019-04-10 — End: 2019-04-09
  Administered 2019-04-10: 03:00:00 via ORAL

## 2019-04-10 MED FILL — OXYCODONE-ACETAMINOPHEN 5 MG-325 MG TAB: 5-325 mg | ORAL | Qty: 1

## 2019-04-10 NOTE — ED Notes (Signed)
Lab notified on added on requests

## 2019-04-10 NOTE — ED Provider Notes (Signed)
[facility]  Emergency Department Treatment Report    Patient: [name] [name] Age: 68 y.o. Sex: female    Date of Birth: January 06, 1951 Admit Date: 04/09/2019 PCP: Ernst Breach   MRN: [id number]  CSN: [id number]  Attending: Lindajo Royal, MD   Room: [phone] Time Dictated: 1:12 AM APP: Vinette Crites     Chief Complaint   Chief Complaint   Patient presents with   ??? Groin Pain       History of Present Illness   68 y.o. female with a history of chronic pain, hypertension diabetes COPD asthma hyperlipidemia presents the emergency department with right groin pain.  She is here just over a week ago for the same pain.  She was given medication and recommended to follow-up with orthopedist.  They have not contacted orthopedist or primary care provider since then.  They ran out of her medication that was prescribed yesterday and she has been having constant severe pain in her right hip.  She has had a hip replacement approximately 3 years ago and the orthopedist to perform the surgery has moved to [place].  She has had chronic pain since then and was taking narcotics but has not been on narcotics regularly for over a year.  Pain is worse with any movement of the hip.  She denies any fevers chills nausea vomiting chest pain shortness of breath or abdominal pain.  Her husband did mention that she has history of hernia repair in that same area.  She denies any recent fall or injury.  She denies any IV drug abuse.    Review of Systems   Constitutional: No fever, chills, or weight loss  Eyes: No visual symptoms.  ENT: No sore throat, runny nose or ear pain.  Respiratory: No cough, dyspnea or wheezing.  Cardiovascular: No chest pain, pressure, palpitations, tightness or heaviness.  Gastrointestinal: No vomiting, diarrhea or abdominal pain.  Genitourinary: No dysuria, frequency, or urgency.  Musculoskeletal: Right groin pain.  Integumentary: No rashes.  Neurological: No headaches, sensory or motor symptoms.     Past Medical/Surgical History     Past Medical History:   Diagnosis Date   ??? Arthritis    ??? 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 DM2 (diabetes mellitus, type 2) ([place])    ??? GERD (gastroesophageal reflux disease)    ??? Hepatitis C     HEP C   ??? HTN (hypertension)    ??? Lung nodule, multiple     (CT 10/2016) Small left upper lobe and 1.7 x 1.6 cm left lower lobe nodules not significantly changed from 07/23/2016 and 03/22/2016   ??? Marijuana use    ??? PTSD (post-traumatic stress disorder)     lived through the [place] in 1993   ??? Thoracic ascending aortic aneurysm ([place])     4.4cm noted on CT Chest (10/2016)   ??? Thyroid nodule     2.5 cm stable right thyroid nodule (CT 10/2016)     Past Surgical History:   Procedure Laterality Date   ??? HX ENDOSCOPY     ??? HX HERNIA REPAIR  2019    x2   ??? HX HIP REPLACEMENT Right 01/29/2015   ??? HX HIP REPLACEMENT     ??? HX HYSTERECTOMY  2010    hysterectomy  Social History     Social History     Socioeconomic History   ??? Marital status: MARRIED     Spouse name: Not on file   ??? Number of children: Not on file   ??? Years of education: Not on file   ??? Highest education level: Not on file   Tobacco Use   ??? Smoking status: Former Smoker   ??? Smokeless tobacco: Never Used   ??? Tobacco comment: reported as quit smoking around 1990s per spouse   Substance and Sexual Activity   ??? Alcohol use: No   ??? Drug use: Yes     Types: Marijuana   ??? Sexual activity: Never       Family History     Family History   Problem Relation Age of Onset   ??? Hypertension Mother    ??? Other Mother         OSA   ??? Cancer Father         suspected leukemia per pt's spouse   ??? Cancer Maternal Aunt    ??? Alcohol abuse Maternal Grandfather        Home Medications     Prior to Admission Medications   Prescriptions Last Dose Informant Patient Reported? Taking?    LANTUS SOLOSTAR U-100 INSULIN 100 unit/mL (3 mL) inpn   No No   Sig: INJECT 32 UNITS SUBCUTANEOUSLY  NIGHTLY   QUEtiapine SR (SEROQUEL XR) 150 mg sr tablet   Yes No   Sig: Take 150 mg by mouth nightly as needed.   albuterol (ACCUNEB) 1.25 mg/3 mL nebu   No No   Sig: USE 1 VIAL IN NEBULIZER EVERY 6 HOURS AS NEEDED FOR  SHORTNESS  OF  BREATH,  WHEEZING   butalbital-acetaminophen-caff (FIORICET) 50-300-40 mg per capsule   No No   Sig: Take 1 Cap by mouth every four (4) hours as needed for Headache.   cetirizine (ZYRTEC) 10 mg tablet   No No   Sig: Take 1 Tab by mouth daily.   cloNIDine HCl (CATAPRES) 0.1 mg tablet   No No   Sig: Take 3 Tabs by mouth two (2) times a day.   dilTIAZem CD (CARDIZEM CD) 240 mg ER capsule   No No   Sig: Take 1 Cap by mouth daily.   fluticasone propion-salmeterol (ADVAIR DISKUS) 100-50 mcg/dose diskus inhaler   No No   Sig: Take 1 Puff by inhalation two (2) times a day.   fluticasone propionate (FLONASE) 50 mcg/actuation nasal spray   No No   Sig: 2 Sprays by Both Nostrils route daily.   gabapentin (NEURONTIN) 300 mg capsule   No No   Sig: Take 1 Cap by mouth three (3) times daily. Max Daily Amount: 900 mg.   guaiFENesin ER (MUCINEX) 600 mg ER tablet   No No   Sig: Take 1 Tab by mouth every twelve (12) hours.   ibuprofen (MOTRIN) 600 mg tablet   No No   Sig: Take 1 Tab by mouth every six (6) hours as needed for Pain.   losartan (COZAAR) 50 mg tablet   No No   Sig: Take 1 Tab by mouth daily.   magnesium oxide (MAG-OX) 400 mg tablet   Yes No   Sig: Take 400 mg by mouth daily.   metFORMIN ER (GLUCOPHAGE XR) 500 mg tablet   No No   Sig: TAKE 1 TABLET TWICE DAILY   methocarbamoL (ROBAXIN) 750 mg tablet   No No  Sig: Take 1 Tab by mouth four (4) times daily.   montelukast (SINGULAIR) 10 mg tablet   No No   Sig: TAKE 1 TABLET EVERY DAY   ondansetron (Zofran ODT) 4 mg disintegrating tablet   No No   Sig: Take 1 Tab by mouth every eight (8) hours as needed for Nausea. Indications: nausea    polyethylene glycol (MIRALAX) 17 gram packet   No No   Sig: Take 1 Packet by mouth daily.   Patient taking differently: Take 17 g by mouth daily as needed for Constipation.   rosuvastatin (CRESTOR) 5 mg tablet   No No   Sig: TAKE 1 TABLET BY MOUTH NIGHTLY   spironolactone (ALDACTONE) 100 mg tablet   No No   Sig: TAKE 1 TABLET EVERY DAY   tiotropium bromide (SPIRIVA RESPIMAT) 2.5 mcg/actuation inhaler   No No   Sig: Take 1 Puff by inhalation daily.   traZODone (DESYREL) 50 mg tablet   No No   Sig: Take 1 Tab by mouth nightly as needed for Sleep.      Facility-Administered Medications: None       Allergies     Allergies   Allergen Reactions   ??? Chocolate [Cocoa] Sneezing     Confirms allergy but reports "I still eat it"       Physical Exam     ED Triage Vitals [04/09/19 1727]   ED Encounter Vitals Group      BP (!) 150/120      Pulse (Heart Rate) 82      Resp Rate 18      Temp 99.5 ??F (37.5 ??C)      Temp src       O2 Sat (%) 94 %      Weight 190 lb      Height 5' 8"      Constitutional: Patient appears well developed and well nourished.  Appearance and behavior are age and situation appropriate.  Toxic in appearance but does appear to be in pain.  HEENT: Conjunctivae clear.  PERRL. Mucous membranes moist, non-erythematous. Neck: supple, non tender, symmetrical, no masses or JVD.   Respiratory: lungs clear to auscultation, nonlabored respirations. No tachypnea or accessory muscle use.  Cardiovascular: heart regular rate and rhythm without murmur rubs or gallops. No peripheral edema or significant variscosities.  Distal pulses 2+ bilaterally: dorsalis pedis and radial.  Gastrointestinal:  Abdomen soft, nontender without complaint of pain to palpation. No guarding, distension or rigidity.  No inguinal bulge appreciated no hernia palpated.  Musculoskeletal: Able to bear weight but with significant discomfort.  Able to perform range of motion of right hip with discomfort mainly with  abduction and.  Tenderness with palpation to inguinal crease.  Difficulty lifting the right leg off of bed for any duration of time likely due to pain.  Extension and flexion of lower extremities intact sitting on edge of bed.  Integumentary: warm and dry without rashes or lesions  Neurologic: alert and oriented, Sensation intact, motor strength equal and symmetric.  Impression and Management Plan   This is a 68 year old female presenting to the emergency department with right hip pain that has been going on for several months seeming to be getting worse.  She was recently here for similar symptoms.  Will review records from her recent visit and treat her pain.    Diagnostic Studies   Lab:   Recent Results (from the past 12 hour(s))   CBC WITH AUTOMATED DIFF  Collection Time: 04/09/19  8:53 PM   Result Value Ref Range    WBC 13.6 (H) 4.0 - 11.0 1000/mm3    RBC 4.95 3.60 - 5.20 M/uL    HGB 14.6 13.0 - 17.2 gm/dl    HCT 44.7 37.0 - 50.0 %    MCV 90.3 80.0 - 98.0 fL    MCH 29.5 25.4 - 34.6 pg    MCHC 32.7 30.0 - 36.0 gm/dl    PLATELET 256 140 - 450 1000/mm3    MPV 11.6 (H) 6.0 - 10.0 fL    RDW-SD 44.7 36.4 - 46.3      NRBC 0 0 - 0      IMMATURE GRANULOCYTES 0.3 0.0 - 3.0 %    NEUTROPHILS 69.4 (H) 34 - 64 %    LYMPHOCYTES 21.8 (L) 28 - 48 %    MONOCYTES 7.4 1 - 13 %    EOSINOPHILS 0.8 0 - 5 %    BASOPHILS 0.3 0 - 3 %   METABOLIC PANEL, COMPREHENSIVE    Collection Time: 04/09/19  8:53 PM   Result Value Ref Range    Sodium 138 136 - 145 mEq/L    Potassium 4.9 3.5 - 5.1 mEq/L    Chloride 103 98 - 107 mEq/L    CO2 32 21 - 32 mEq/L    Glucose 118 (H) 74 - 106 mg/dl    BUN 19 7 - 25 mg/dl    Creatinine 1.1 0.6 - 1.3 mg/dl    GFR est AA >60.0      GFR est non-AA 53      Calcium 10.3 (H) 8.5 - 10.1 mg/dl    AST (SGOT) 9 (L) 15 - 37 U/L    ALT (SGPT) 17 12 - 78 U/L    Alk. phosphatase 103 45 - 117 U/L    Bilirubin, total 0.3 0.2 - 1.0 mg/dl    Protein, total 8.6 (H) 6.4 - 8.2 gm/dl    Albumin 4.1 3.4 - 5.0 gm/dl     Anion gap 3 (L) 5 - 15 mmol/L   C REACTIVE PROTEIN, QT    Collection Time: 04/09/19  8:53 PM   Result Value Ref Range    C-Reactive protein <2.9 0.0 - 2.9 mg/L   SED RATE (ESR)    Collection Time: 04/10/19 12:27 AM   Result Value Ref Range    Sed rate (ESR) 28 0 - 30 mm/Hr       Imaging:    No results found.    ED Course/Medical Decision Making   Patient remained stable throughout her stay here in the emergency department.  Records from her most recent visit were reviewed.  X-ray of the right hip was obtained on 04/01/2019 the image showed total right hip prosthesis in good position negative for fracture and moderate osteoarthrosis of the left hip.  Patient is not having any left hip discomfort at this time.  She has range of motion of her right hip but significant discomfort with abduction and.  Due to her second visit for the same pain labs were obtained as well as sed rate and C-reactive protein.  Patient was given a Percocet.  The Percocet did offer some relief of her symptoms.  CBC showed a leukocytosis of 13.6 normal hemoglobin and hematocrit.  Chemistry was unremarkable.  C-reactive protein was normal at less than 2.9, sed rate was 28. We discussed these reulsts with the patinet and her husband and they both felt comfortable being discharged  home to follow up with primary care provider and orthopedist. We encouraged antiinflammatories and resting in addition to the muscle relaxer that was provided. This appears to be acute on chronic hip pain with no acute emergent process present. I believe she is stable for outpatient management and she agrees. She was encouraged to return to the ED if she developed any new concerning symptoms.          Medications   oxyCODONE-acetaminophen (PERCOCET) 5-325 mg per tablet 1 Tab (1 Tab Oral Given 04/09/19 2232)     Final Diagnosis       ICD-10-CM ICD-9-CM   1. Right hip pain  M25.551 719.45   2. Right groin pain  R10.31 789.03     Disposition    Discharged home in stable condition.   Current Discharge Medication List      CONTINUE these medications which have CHANGED    Details   methocarbamoL (Robaxin) 500 mg tablet Take 1 Tab by mouth four (4) times daily.  Qty: 20 Tab, Refills: 0             The patient was personally evaluated by myself and discussed with [name], MARK C, MD who agrees with the above assessment and plan      Royann Shivers PA-C  April 10, 2019    My signature above authenticates this document and my orders, the final    diagnosis (es), discharge prescription (s), and instructions in the Epic    record.  If you have any questions please contact [phone].     Nursing notes have been reviewed by the physician/ advanced practice    Clinician.    Dragon medical dictation software was used for portions of this report. Unintended voice recognition errors may occur.

## 2019-04-10 NOTE — ED Notes (Signed)
2:05 AM  04/10/19     Discharge instructions given to [name] [name]   (name) with verbalization of understanding. Patient accompanied by patient and spouse.  Patient discharged with the following prescriptions   Current Discharge Medication List      CONTINUE these medications which have CHANGED    Details   methocarbamoL (Robaxin) 500 mg tablet Take 1 Tab by mouth four (4) times daily.  Qty: 20 Tab, Refills: 0          . Patient discharged to Home (destination).      Bright Yeboah, RN, RN

## 2019-04-10 NOTE — ED Notes (Signed)
Lab notified on added on requests

## 2019-04-10 NOTE — ED Provider Notes (Signed)
ED Provider Notes by Royann Shivers, PA at 04/10/19 0112                Author: Royann Shivers, PA  Service: --  Author Type: Physician Assistant       Filed: 04/10/19 0152  Date of Service: 04/10/19 0112  Status: Attested           Editor: Royann Shivers, PA (Physician Assistant)  Cosigner: Lindajo Royal, MD at 04/10/19 1328          Attestation signed by Lindajo Royal, MD at 04/10/19 1328          I, Lindajo Royal, M.D., discussed with the mid-level provider and agree with the evaluation and plans as documented here.  I was available to address any questions  during Emergency Department evaluation.                                    [place]   Emergency Department Treatment Report          Patient: [name] [name]  Age: 68 y.o.  Sex: female          Date of Birth: April 16, 1951  Admit Date: 04/09/2019  PCP: Ernst Breach     MRN: [id number]   CSN: [id number]   Attending: Lindajo Royal, MD         Room: [phone]  Time Dictated: 1:12 AM  APP: Clark Clowdus        Chief Complaint      Chief Complaint       Patient presents with        ?  Groin Pain             History of Present Illness     68 y.o. female  with a history of chronic pain, hypertension diabetes COPD asthma hyperlipidemia presents the emergency department with right groin pain.  She is here just over a week ago for the same pain.  She was given medication and recommended to follow-up with  orthopedist.  They have not contacted orthopedist or primary care provider since then.  They ran out of her medication that was prescribed yesterday and she has been having constant severe pain in her right hip.  She has had a hip replacement approximately  3 years ago and the orthopedist to perform the surgery has moved to [place].  She has had chronic pain since then and was taking narcotics but has not been on narcotics regularly for over a year.  Pain is worse with any movement of the hip.  She denies  any fevers chills nausea vomiting chest  pain shortness of breath or abdominal pain.  Her husband did mention that she has history of hernia repair in that same area.  She denies any recent fall or injury.  She denies any IV drug abuse.        Review of Systems     Constitutional: No fever, chills, or weight loss   Eyes: No visual symptoms.   ENT: No sore throat, runny nose or ear pain.   Respiratory: No cough, dyspnea or wheezing.   Cardiovascular: No chest pain, pressure, palpitations, tightness or heaviness.   Gastrointestinal: No vomiting, diarrhea or abdominal pain.   Genitourinary: No dysuria, frequency, or urgency.   Musculoskeletal: Right groin pain.   Integumentary: No rashes.   Neurological: No headaches, sensory or motor  symptoms.        Past Medical/Surgical History          Past Medical History:        Diagnosis  Date         ?  Arthritis       ?  Chronic pain syndrome            related to R hip replacement         ?  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  DM2 (diabetes mellitus, type 2) ([place])       ?  GERD (gastroesophageal reflux disease)       ?  Hepatitis C            HEP C         ?  HTN (hypertension)       ?  Lung nodule, multiple            (CT 10/2016) Small left upper lobe and 1.7 x 1.6 cm left lower lobe nodules not significantly changed from 07/23/2016 and 03/22/2016         ?  Marijuana use       ?  PTSD (post-traumatic stress disorder)            lived through the [place] in 1993         ?  Thoracic ascending aortic aneurysm ([place])            4.4cm noted on CT Chest (10/2016)         ?  Thyroid nodule            2.5 cm stable right thyroid nodule (CT 10/2016)          Past Surgical History:         Procedure  Laterality  Date          ?  HX ENDOSCOPY         ?  HX HERNIA REPAIR    2019          x2          ?  HX HIP REPLACEMENT  Right  01/29/2015     ?  HX HIP REPLACEMENT         ?  HX HYSTERECTOMY    2010          hysterectomy             Social  History          Social History          Socioeconomic History         ?  Marital status:  MARRIED              Spouse name:  Not on file         ?  Number of children:  Not on file     ?  Years of education:  Not on file     ?  Highest education level:  Not on file       Tobacco Use         ?  Smoking status:  Former Smoker     ?  Smokeless tobacco:  Never Used        ?  Tobacco comment: reported as quit smoking around 1990s per  spouse       Substance and Sexual Activity         ?  Alcohol use:  No     ?  Drug use:  Yes              Types:  Marijuana         ?  Sexual activity:  Never             Family History          Family History         Problem  Relation  Age of Onset          ?  Hypertension  Mother       ?  Other  Mother                OSA          ?  Cancer  Father                suspected leukemia per pt's spouse          ?  Cancer  Maternal Aunt            ?  Alcohol abuse  Maternal Grandfather               Home Medications          Prior to Admission Medications     Prescriptions  Last Dose  Informant  Patient Reported?  Taking?      LANTUS SOLOSTAR U-100 INSULIN 100 unit/mL (3 mL) inpn      No  No      Sig: INJECT 32 UNITS SUBCUTANEOUSLY  NIGHTLY      QUEtiapine SR (SEROQUEL XR) 150 mg sr tablet      Yes  No      Sig: Take 150 mg by mouth nightly as needed.      albuterol (ACCUNEB) 1.25 mg/3 mL nebu      No  No      Sig: USE 1 VIAL IN NEBULIZER EVERY 6 HOURS AS NEEDED FOR  SHORTNESS  OF  BREATH,  WHEEZING      butalbital-acetaminophen-caff (FIORICET) 50-300-40 mg per capsule      No  No      Sig: Take 1 Cap by mouth every four (4) hours as needed for Headache.      cetirizine (ZYRTEC) 10 mg tablet      No  No      Sig: Take 1 Tab by mouth daily.      cloNIDine HCl (CATAPRES) 0.1 mg tablet      No  No      Sig: Take 3 Tabs by mouth two (2) times a day.      dilTIAZem CD (CARDIZEM CD) 240 mg ER capsule      No  No      Sig: Take 1 Cap by mouth daily.      fluticasone propion-salmeterol (ADVAIR DISKUS)  100-50 mcg/dose diskus inhaler      No  No      Sig: Take 1 Puff by inhalation two (2) times a day.      fluticasone propionate (FLONASE) 50 mcg/actuation nasal spray      No  No      Sig: 2 Sprays by Both Nostrils route daily.      gabapentin (NEURONTIN) 300 mg capsule      No  No  Sig: Take 1 Cap by mouth three (3) times daily. Max Daily Amount: 900 mg.      guaiFENesin ER (MUCINEX) 600 mg ER tablet      No  No      Sig: Take 1 Tab by mouth every twelve (12) hours.      ibuprofen (MOTRIN) 600 mg tablet      No  No      Sig: Take 1 Tab by mouth every six (6) hours as needed for Pain.      losartan (COZAAR) 50 mg tablet      No  No      Sig: Take 1 Tab by mouth daily.      magnesium oxide (MAG-OX) 400 mg tablet      Yes  No      Sig: Take 400 mg by mouth daily.      metFORMIN ER (GLUCOPHAGE XR) 500 mg tablet      No  No      Sig: TAKE 1 TABLET TWICE DAILY      methocarbamoL (ROBAXIN) 750 mg tablet      No  No      Sig: Take 1 Tab by mouth four (4) times daily.      montelukast (SINGULAIR) 10 mg tablet      No  No      Sig: TAKE 1 TABLET EVERY DAY      ondansetron (Zofran ODT) 4 mg disintegrating tablet      No  No      Sig: Take 1 Tab by mouth every eight (8) hours as needed for Nausea. Indications: nausea      polyethylene glycol (MIRALAX) 17 gram packet      No  No      Sig: Take 1 Packet by mouth daily.      Patient taking differently: Take 17 g by mouth daily as needed for Constipation.      rosuvastatin (CRESTOR) 5 mg tablet      No  No      Sig: TAKE 1 TABLET BY MOUTH NIGHTLY      spironolactone (ALDACTONE) 100 mg tablet      No  No      Sig: TAKE 1 TABLET EVERY DAY      tiotropium bromide (SPIRIVA RESPIMAT) 2.5 mcg/actuation inhaler      No  No      Sig: Take 1 Puff by inhalation daily.      traZODone (DESYREL) 50 mg tablet      No  No      Sig: Take 1 Tab by mouth nightly as needed for Sleep.               Facility-Administered Medications: None             Allergies          Allergies        Allergen   Reactions         ?  Chocolate [Cocoa]  Sneezing             Confirms allergy but reports "I still eat it"             Physical Exam          ED Triage Vitals [04/09/19 1727]     ED Encounter Vitals Group           BP  (!) 150/120        Pulse (Heart  Rate)  82        Resp Rate  18        Temp  99.5 ??F (37.5 ??C)        Temp src          O2 Sat (%)  94 %        Weight  190 lb           Height  '5\' 8"'         Constitutional: Patient appears well developed and well nourished.  Appearance and behavior are age and situation appropriate.  Toxic in appearance  but does appear to be in pain.   HEENT: Conjunctivae clear.  PERRL. Mucous membranes moist, non-erythematous. Neck:  supple, non tender, symmetrical, no masses or JVD.    Respiratory: lungs clear to auscultation, nonlabored respirations. No tachypnea or accessory muscle use.   Cardiovascular: heart regular rate and rhythm without murmur rubs or gallops. No peripheral edema or significant variscosities.  Distal pulses  2+ bilaterally: dorsalis pedis and radial.   Gastrointestinal:  Abdomen soft, nontender without complaint of pain to palpation. No guarding, distension or rigidity.  No inguinal bulge  appreciated no hernia palpated.   Musculoskeletal: Able to bear weight but with significant discomfort.  Able to perform range of motion of right hip with discomfort mainly  with abduction and.  Tenderness with palpation to inguinal crease.  Difficulty lifting the right leg off of bed for any duration of time likely due to pain.  Extension and flexion of lower extremities intact sitting on edge of bed.   Integumentary: warm and dry without rashes or lesions   Neurologic: alert and oriented, Sensation intact, motor strength equal and symmetric.     Impression and Management Plan     This is a 68 year old female presenting to the emergency department with right hip pain that has been going on for several months seeming to be getting worse.  She was recently here  for similar  symptoms.  Will review records from her recent visit and treat her pain.        Diagnostic Studies     Lab:      Recent Results (from the past 12 hour(s))     CBC WITH AUTOMATED DIFF          Collection Time: 04/09/19  8:53 PM         Result  Value  Ref Range            WBC  13.6 (H)  4.0 - 11.0 1000/mm3            RBC  4.95  3.60 - 5.20 M/uL            HGB  14.6  13.0 - 17.2 gm/dl       HCT  44.7  37.0 - 50.0 %       MCV  90.3  80.0 - 98.0 fL       MCH  29.5  25.4 - 34.6 pg       MCHC  32.7  30.0 - 36.0 gm/dl       PLATELET  256  140 - 450 1000/mm3       MPV  11.6 (H)  6.0 - 10.0 fL       RDW-SD  44.7  36.4 - 46.3         NRBC  0  0 - 0         IMMATURE GRANULOCYTES  0.3  0.0 - 3.0 %       NEUTROPHILS  69.4 (H)  34 - 64 %       LYMPHOCYTES  21.8 (L)  28 - 48 %       MONOCYTES  7.4  1 - 13 %       EOSINOPHILS  0.8  0 - 5 %       BASOPHILS  0.3  0 - 3 %       METABOLIC PANEL, COMPREHENSIVE          Collection Time: 04/09/19  8:53 PM         Result  Value  Ref Range            Sodium  138  136 - 145 mEq/L       Potassium  4.9  3.5 - 5.1 mEq/L       Chloride  103  98 - 107 mEq/L       CO2  32  21 - 32 mEq/L       Glucose  118 (H)  74 - 106 mg/dl       BUN  19  7 - 25 mg/dl       Creatinine  1.1  0.6 - 1.3 mg/dl       GFR est AA  >60.0          GFR est non-AA  53          Calcium  10.3 (H)  8.5 - 10.1 mg/dl       AST (SGOT)  9 (L)  15 - 37 U/L       ALT (SGPT)  17  12 - 78 U/L       Alk. phosphatase  103  45 - 117 U/L       Bilirubin, total  0.3  0.2 - 1.0 mg/dl       Protein, total  8.6 (H)  6.4 - 8.2 gm/dl       Albumin  4.1  3.4 - 5.0 gm/dl       Anion gap  3 (L)  5 - 15 mmol/L       C REACTIVE PROTEIN, QT          Collection Time: 04/09/19  8:53 PM         Result  Value  Ref Range            C-Reactive protein  <2.9  0.0 - 2.9 mg/L       SED RATE (ESR)          Collection Time: 04/10/19 12:27 AM         Result  Value  Ref Range            Sed rate (ESR)  28  0 - 30 mm/Hr           Imaging:     No results found.         ED Course/Medical Decision Making     Patient remained stable throughout her stay here in the emergency department.  Records from her most recent visit were reviewed.  X-ray of the right hip was obtained on 04/01/2019  the image showed total right hip prosthesis in good position negative for fracture and moderate osteoarthrosis of the left hip.  Patient is not having any left hip discomfort at this time.  She has range of motion of her right hip but significant discomfort  with abduction  and.  Due to her second visit for the same pain labs were obtained as well as sed rate and C-reactive protein.  Patient was given a Percocet.  The Percocet did offer some relief of her symptoms.  CBC showed a leukocytosis of 13.6 normal  hemoglobin and hematocrit.  Chemistry was unremarkable.  C-reactive protein was normal at less than 2.9, sed rate was 28. We discussed these reulsts with the patinet and her husband and they both felt comfortable being discharged home to follow up with  primary care provider and orthopedist. We encouraged antiinflammatories and resting in addition to the muscle relaxer that was provided. This appears to be acute on chronic hip pain with no acute emergent process present. I believe she is stable for outpatient  management and she agrees. She was encouraged to return to the ED if she developed any new concerning symptoms.               Medications       oxyCODONE-acetaminophen (PERCOCET) 5-325 mg per tablet 1 Tab (1 Tab Oral Given 04/09/19 2232)          Final Diagnosis                 ICD-10-CM  ICD-9-CM          1.  Right hip pain   M25.551  719.45          2.  Right groin pain   R10.31  789.03          Disposition     Discharged home in stable condition.      Current Discharge Medication List              CONTINUE these medications which have CHANGED          Details        methocarbamoL (Robaxin) 500 mg tablet  Take 1 Tab by mouth four (4) times daily.   Qty: 20 Tab, Refills:  0                          The patient was personally evaluated by myself and discussed with [name], MARK C, MD who agrees with the above assessment and plan         Royann Shivers PA-C   April 10, 2019      My signature above authenticates this document and my orders, the final     diagnosis (es), discharge prescription (s), and instructions in the Epic     record.   If you have any questions please contact [phone].       Nursing notes have been reviewed by the physician/ advanced practice     Clinician.      Dragon medical dictation software was used for portions of this report. Unintended voice recognition errors may occur.

## 2019-04-10 NOTE — ED Notes (Signed)
2:05 AM  04/10/19     Discharge instructions given to [name]   (name) with verbalization of understanding. Patient accompanied by patient and spouse.  Patient discharged with the following prescriptions   Current Discharge Medication List      CONTINUE these medications which have CHANGED    Details   methocarbamoL (Robaxin) 500 mg tablet Take 1 Tab by mouth four (4) times daily.  Qty: 20 Tab, Refills: 0          . Patient discharged to Home (destination).      Otho Ket, RN, RN

## 2019-04-16 ENCOUNTER — Inpatient Hospital Stay: Payer: MEDICARE | Attending: Family | Primary: Physician Assistant

## 2019-04-16 DIAGNOSIS — R8279 Other abnormal findings on microbiological examination of urine: Secondary | ICD-10-CM

## 2019-04-17 ENCOUNTER — Inpatient Hospital Stay: Admit: 2019-04-17 | Payer: MEDICARE | Primary: Physician Assistant

## 2019-04-18 LAB — CULTURE, URINE
CULTURE RESULT: >100000 — AB
Culture result: >100000 — AB

## 2019-04-22 ENCOUNTER — Encounter: Payer: PRIVATE HEALTH INSURANCE | Attending: Urology | Primary: Physician Assistant

## 2019-04-25 ENCOUNTER — Ambulatory Visit: Payer: MEDICARE | Attending: Orthopaedic Surgery | Primary: Physician Assistant

## 2019-05-02 ENCOUNTER — Encounter

## 2019-05-07 ENCOUNTER — Emergency Department: Admit: 2019-05-07 | Payer: MEDICARE | Primary: Physician Assistant

## 2019-05-07 ENCOUNTER — Inpatient Hospital Stay
Admit: 2019-05-07 | Discharge: 2019-05-13 | Disposition: A | Discharge status: Home / Self Care | Payer: MEDICARE | Attending: Internal Medicine | Admitting: Internal Medicine

## 2019-05-07 DIAGNOSIS — J441 Chronic obstructive pulmonary disease with (acute) exacerbation: Principal | ICD-10-CM

## 2019-05-07 LAB — COMPREHENSIVE METABOLIC PANEL
ALT: 16 U/L (ref 12–78)
AST: 7 U/L — ABNORMAL LOW (ref 15–37)
Albumin: 4.2 gm/dl (ref 3.4–5.0)
Alkaline Phosphatase: 108 U/L (ref 45–117)
Anion Gap: 6 mmol/L (ref 5–15)
BUN: 24 mg/dl (ref 7–25)
CO2: 26 mEq/L (ref 21–32)
Calcium: 9.8 mg/dl (ref 8.5–10.1)
Chloride: 105 mEq/L (ref 98–107)
Creatinine: 1.2 mg/dl (ref 0.6–1.3)
EGFR IF NonAfrican American: 48
GFR African American: 58
Glucose: 159 mg/dl — ABNORMAL HIGH (ref 74–106)
Potassium: 4.3 mEq/L (ref 3.5–5.1)
Sodium: 136 mEq/L (ref 136–145)
Total Bilirubin: 0.5 mg/dl (ref 0.2–1.0)
Total Protein: 9.2 gm/dl — ABNORMAL HIGH (ref 6.4–8.2)

## 2019-05-07 LAB — LIPASE
Lipase: 90 U/L (ref 73–393)
Lipase: 90 U/L (ref 73–393)

## 2019-05-07 LAB — POC URINE MACROSCOPIC
Glucose, Ur: NEGATIVE mg/dl
Glucose: NEGATIVE mg/dl
Ketone: NEGATIVE mg/dl
Ketones, Urine: NEGATIVE mg/dl
Leukocyte Esterase, Urine: NEGATIVE
Leukocyte Esterase: NEGATIVE
Nitrite, Urine: NEGATIVE
Nitrites: NEGATIVE
Protein, UA: >=300 mg/dl — AB
Protein: >=300 mg/dl — AB
Specific Gravity, UA: >=1.03 (ref 1.005–1.030)
Specific gravity: >=1.03 (ref 1.005–1.030)
Urobilinogen, UA, POCT: 0.2 EU/dl (ref 0.0–1.0)
Urobilinogen: 0.2 EU/dl (ref 0.0–1.0)
pH (UA): 5 (ref 5–9)
pH, UA: 5 (ref 5–9)

## 2019-05-07 LAB — CBC WITH AUTO DIFFERENTIAL
Basophils %: 0.3 % (ref 0–3)
Eosinophils %: 0.5 % (ref 0–5)
Hematocrit: 43.7 % (ref 37.0–50.0)
Hemoglobin: 14.8 gm/dl (ref 13.0–17.2)
Immature Granulocytes: 0.4 % (ref 0.0–3.0)
Lymphocytes %: 17 % — ABNORMAL LOW (ref 28–48)
MCH: 30.1 pg (ref 25.4–34.6)
MCHC: 33.9 gm/dl (ref 30.0–36.0)
MCV: 89 fL (ref 80.0–98.0)
MPV: 11 fL — ABNORMAL HIGH (ref 6.0–10.0)
Monocytes %: 4.9 % (ref 1–13)
Neutrophils %: 76.9 % — ABNORMAL HIGH (ref 34–64)
Nucleated RBCs: 0 (ref 0–0)
Platelets: 303 10*3/uL (ref 140–450)
RBC: 4.91 M/uL (ref 3.60–5.20)
RDW-SD: 43.6 (ref 36.4–46.3)
WBC: 21.1 10*3/uL — ABNORMAL HIGH (ref 4.0–11.0)

## 2019-05-07 LAB — METABOLIC PANEL, COMPREHENSIVE
ALT (SGPT): 16 U/L (ref 12–78)
AST (SGOT): 7 U/L — ABNORMAL LOW (ref 15–37)
Albumin: 4.2 gm/dl (ref 3.4–5.0)
Alk. phosphatase: 108 U/L (ref 45–117)
Anion gap: 6 mmol/L (ref 5–15)
BUN: 24 mg/dl (ref 7–25)
Bilirubin, total: 0.5 mg/dl (ref 0.2–1.0)
CO2: 26 mEq/L (ref 21–32)
Calcium: 9.8 mg/dl (ref 8.5–10.1)
Chloride: 105 mEq/L (ref 98–107)
Creatinine: 1.2 mg/dl (ref 0.6–1.3)
GFR est AA: 58
GFR est non-AA: 48
Glucose: 159 mg/dl — ABNORMAL HIGH (ref 74–106)
Potassium: 4.3 mEq/L (ref 3.5–5.1)
Protein, total: 9.2 gm/dl — ABNORMAL HIGH (ref 6.4–8.2)
Sodium: 136 mEq/L (ref 136–145)

## 2019-05-07 LAB — CBC WITH AUTOMATED DIFF
BASOPHILS: 0.3 % (ref 0–3)
EOSINOPHILS: 0.5 % (ref 0–5)
HCT: 43.7 % (ref 37.0–50.0)
HGB: 14.8 gm/dl (ref 13.0–17.2)
IMMATURE GRANULOCYTES: 0.4 % (ref 0.0–3.0)
LYMPHOCYTES: 17 % — ABNORMAL LOW (ref 28–48)
MCH: 30.1 pg (ref 25.4–34.6)
MCHC: 33.9 gm/dl (ref 30.0–36.0)
MCV: 89 fL (ref 80.0–98.0)
MONOCYTES: 4.9 % (ref 1–13)
MPV: 11 fL — ABNORMAL HIGH (ref 6.0–10.0)
NEUTROPHILS: 76.9 % — ABNORMAL HIGH (ref 34–64)
NRBC: 0 (ref 0–0)
PLATELET: 303 10*3/uL (ref 140–450)
RBC: 4.91 M/uL (ref 3.60–5.20)
RDW-SD: 43.6 (ref 36.4–46.3)
WBC: 21.1 10*3/uL — ABNORMAL HIGH (ref 4.0–11.0)

## 2019-05-07 MED ORDER — METHYLPREDNISOLONE (PF) 125 MG/2 ML IJ SOLR
125 mg/2 mL | INTRAMUSCULAR | Status: AC
Start: 2019-05-07 — End: 2019-05-07
  Administered 2019-05-07: via INTRAVENOUS

## 2019-05-07 MED ORDER — MORPHINE 4 MG/ML SYRINGE
4 mg/mL | INTRAMUSCULAR | Status: AC
Start: 2019-05-07 — End: 2019-05-07
  Administered 2019-05-07: 23:00:00 via INTRAVENOUS

## 2019-05-07 MED ORDER — SODIUM CHLORIDE 0.9 % IJ SYRG
Freq: Once | INTRAMUSCULAR | Status: AC
Start: 2019-05-07 — End: 2019-05-07
  Administered 2019-05-07: 19:00:00 via INTRAVENOUS

## 2019-05-07 MED ORDER — ALBUTEROL SULFATE 0.083 % (0.83 MG/ML) SOLN FOR INHALATION
2.5 mg /3 mL (0.083 %) | RESPIRATORY_TRACT | Status: AC
Start: 2019-05-07 — End: 2019-05-07
  Administered 2019-05-07 (×2): via RESPIRATORY_TRACT

## 2019-05-07 MED ORDER — ONDANSETRON (PF) 4 MG/2 ML INJECTION
4 mg/2 mL | INTRAMUSCULAR | Status: DC
Start: 2019-05-07 — End: 2019-05-07

## 2019-05-07 MED ORDER — IOPAMIDOL 61 % IV SOLN
61 % | Freq: Once | INTRAVENOUS | Status: AC
Start: 2019-05-07 — End: 2019-05-07
  Administered 2019-05-07: 23:00:00 via INTRAVENOUS

## 2019-05-07 MED ORDER — ALBUTEROL SULFATE 0.083 % (0.83 MG/ML) SOLN FOR INHALATION
2.5 mg /3 mL (0.083 %) | RESPIRATORY_TRACT | Status: AC
Start: 2019-05-07 — End: 2019-05-07
  Administered 2019-05-08 (×2): via RESPIRATORY_TRACT

## 2019-05-07 MED ORDER — MORPHINE 4 MG/ML SYRINGE
4 mg/mL | INTRAMUSCULAR | Status: AC
Start: 2019-05-07 — End: 2019-05-07

## 2019-05-07 MED ORDER — MORPHINE 4 MG/ML SYRINGE
4 mg/mL | INTRAMUSCULAR | Status: AC
Start: 2019-05-07 — End: 2019-05-07
  Administered 2019-05-07: 19:00:00 via INTRAVENOUS

## 2019-05-07 MED ORDER — SODIUM CHLORIDE 0.9% BOLUS IV
0.9 % | INTRAVENOUS | Status: AC
Start: 2019-05-07 — End: 2019-05-07
  Administered 2019-05-07: 21:00:00 via INTRAVENOUS

## 2019-05-07 MED FILL — MORPHINE 4 MG/ML SYRINGE: 4 mg/mL | INTRAMUSCULAR | Qty: 1

## 2019-05-07 MED FILL — ONDANSETRON (PF) 4 MG/2 ML INJECTION: 4 mg/2 mL | INTRAMUSCULAR | Qty: 2

## 2019-05-07 MED FILL — SOLU-MEDROL (PF) 125 MG/2 ML SOLUTION FOR INJECTION: 125 mg/2 mL | INTRAMUSCULAR | Qty: 2

## 2019-05-07 MED FILL — ALBUTEROL SULFATE 0.083 % (0.83 MG/ML) SOLN FOR INHALATION: 2.5 mg /3 mL (0.083 %) | RESPIRATORY_TRACT | Qty: 1

## 2019-05-07 MED FILL — ISOVUE-300  61 % INTRAVENOUS SOLUTION: 300 mg iodine /mL (61 %) | INTRAVENOUS | Qty: 100

## 2019-05-07 NOTE — ED Notes (Signed)
Patient noticed in waiting room to have   Expiratory grunting--leaning forward  Appears to be in moderate to severe    resp distress  Straight back to room 8  Met by RN Balinda Quails and EDT Thurmond Butts

## 2019-05-07 NOTE — ED Notes (Signed)
Blanket provided. Seen patient leaning against the bed rail moaning c/o abdominal pain.

## 2019-05-07 NOTE — H&P (Signed)
Hospitalist Admission History and Physical    NAME:  [name] [name]   DOB:   04/22/1951   MRN:   [id number]     PCP:  Wallene Huh, PA-C  Admission Date/Time:  05/07/2019 8:41 PM  Anticipated Date of Discharge: 05/10/2019  Anticipated Disposition (home, SNF) : HH         Assessment/Plan:      Active Problems:    COPD exacerbation ([place]) (05/07/2019)      Acute hypoxemic respiratory failure ([place]) (05/07/2019)       Acute hypoxic respiratory failure  COPD exacerbation  Hypertension  Type 2 diabetes mellitus  Hyperlipidemia  Peripheral neuropathy  Left lower lobe nodule  History of tobacco abuse  2 mm nonobstructing right nephrolithiasis  ___________________________________________________  PLAN:    Admit the patient to telemetry  Oxygen, nebs, titrate oxygen to keep sats more than 90%  IV steroid  Glucomander monitoring  Continue Singulair, Cardizem, trazodone, Spiriva  CT scan of the abdomen was done for patient's abdominal discomfort, showed 2 mm nonobstructing right nephrolithiasis  Pulmonary consult in the morning    Risk of deterioration:  [] Low    [x] Moderate  [] High    Prophylaxis:  [] Lovenox  [] Coumadin  [x] Hep SQ  [] SCD???s  [] H2B/PPI[] Eliquis [] Xarelto     Disposition:  [] Home w/ Family   [x] HH PT,OT,RN   [] SNF/LTC   [] SAH/Rehab         IMPRESSION  IMPRESSION:  1. 2 mm nonobstructing right nephrolithiasis.  2. Small stable right renal cyst.  3. Diverticulosis.  4. Hysterectomy.  5. 1.6 and 1.1 cm stable splenic lesions.    Subjective:   CHIEF COMPLAINT:    Chief Complaint   Patient presents with   ??? Abdominal Pain   ??? Cough       HISTORY OF PRESENT ILLNESS:     [name] is a 68 y.o. OTHER female who presents with not feeling well for the past few hours  Patient said she has been feeling nauseous, had some vomiting and loose bowel movement, developed progressive abdominal pain more on the left than the right  She also has been having shortness of breath with cough which is nonproductive, no fever no chest pain   She has history of COPD feels that the symptoms are very consistent to her previous COPD exacerbations    Past Medical History:   Diagnosis Date   ??? Arthritis    ??? 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 DM2 (diabetes mellitus, type 2) ([place])    ??? GERD (gastroesophageal reflux disease)    ??? Hepatitis C     HEP C   ??? HTN (hypertension)    ??? Lung nodule, multiple     (CT 10/2016) Small left upper lobe and 1.7 x 1.6 cm left lower lobe nodules not significantly changed from 07/23/2016 and 03/22/2016   ??? Marijuana use    ??? PTSD (post-traumatic stress disorder)     lived through the [employer] in 1993   ??? Thoracic ascending aortic aneurysm ([place])     4.4cm noted on CT Chest (10/2016)   ??? Thyroid nodule     2.5 cm stable right thyroid nodule (CT 10/2016)        Past Surgical History:   Procedure Laterality Date   ??? HX ENDOSCOPY     ???  HX HERNIA REPAIR  2019    x2   ??? HX HIP REPLACEMENT Right 01/29/2015   ??? HX HIP REPLACEMENT     ??? HX HYSTERECTOMY  2010    hysterectomy       Social History     Tobacco Use   ??? Smoking status: Former Smoker   ??? Smokeless tobacco: Never Used   ??? Tobacco comment: reported as quit smoking around 1990s per spouse   Substance Use Topics   ??? Alcohol use: No        Family History   Problem Relation Age of Onset   ??? Hypertension Mother    ??? Other Mother         OSA   ??? Cancer Father         suspected leukemia per pt's spouse   ??? Cancer Maternal Aunt    ??? Alcohol abuse Maternal Grandfather         Allergies   Allergen Reactions   ??? Chocolate [Cocoa] Sneezing     Confirms allergy but reports "I still eat it"        Prior to Admission Medications   Prescriptions Last Dose Informant Patient Reported? Taking?   LANTUS SOLOSTAR U-100 INSULIN 100 unit/mL (3 mL) inpn 05/06/2019 at 2000  No Yes   Sig: INJECT 32 UNITS SUBCUTANEOUSLY  NIGHTLY    QUEtiapine SR (SEROQUEL XR) 150 mg sr tablet   Yes Yes   Sig: Take 150 mg by mouth nightly as needed.   albuterol (ACCUNEB) 1.25 mg/3 mL nebu   No Yes   Sig: USE 1 VIAL IN NEBULIZER EVERY 6 HOURS AS NEEDED FOR  SHORTNESS  OF  BREATH,  WHEEZING   butalbital-acetaminophen-caff (FIORICET) 50-300-40 mg per capsule   No Yes   Sig: Take 1 Cap by mouth every four (4) hours as needed for Headache.   cetirizine (ZYRTEC) 10 mg tablet   No Yes   Sig: Take 1 Tab by mouth daily.   cloNIDine HCl (CATAPRES) 0.1 mg tablet 05/06/2019 at 2000  No Yes   Sig: Take 3 Tabs by mouth two (2) times a day.   dilTIAZem CD (CARDIZEM CD) 240 mg ER capsule 05/06/2019 at 1000  No Yes   Sig: Take 1 Cap by mouth daily.   fluticasone propion-salmeterol (ADVAIR DISKUS) 100-50 mcg/dose diskus inhaler   No Yes   Sig: Take 1 Puff by inhalation two (2) times a day.   fluticasone propionate (FLONASE) 50 mcg/actuation nasal spray   No Yes   Sig: 2 Sprays by Both Nostrils route daily.   Patient taking differently: 2 Sprays by Both Nostrils route daily as needed.   gabapentin (NEURONTIN) 300 mg capsule   No Yes   Sig: Take 1 Cap by mouth three (3) times daily. Max Daily Amount: 900 mg.   guaiFENesin ER (MUCINEX) 600 mg ER tablet   No Yes   Sig: Take 1 Tab by mouth every twelve (12) hours.   ibuprofen (MOTRIN) 600 mg tablet   No Yes   Sig: Take 1 Tab by mouth every six (6) hours as needed for Pain.   losartan (COZAAR) 50 mg tablet 05/06/2019 at 1000  No Yes   Sig: Take 1 Tab by mouth daily.   magnesium oxide (MAG-OX) 400 mg tablet   Yes Yes   Sig: Take 400 mg by mouth daily.   metFORMIN ER (GLUCOPHAGE XR) 500 mg tablet 05/06/2019 at 2000  No Yes   Sig: TAKE 1  TABLET TWICE DAILY   methocarbamoL (Robaxin) 500 mg tablet   No Yes   Sig: Take 1 Tab by mouth four (4) times daily.   montelukast (SINGULAIR) 10 mg tablet   No Yes   Sig: TAKE 1 TABLET EVERY DAY   polyethylene glycol (MIRALAX) 17 gram packet   No Yes   Sig: Take 1 Packet by mouth daily.    Patient taking differently: Take 17 g by mouth daily as needed for Constipation.   rosuvastatin (CRESTOR) 5 mg tablet   No Yes   Sig: TAKE 1 TABLET BY MOUTH NIGHTLY   spironolactone (ALDACTONE) 100 mg tablet 05/06/2019 at 1000  No Yes   Sig: TAKE 1 TABLET EVERY DAY   tiotropium bromide (SPIRIVA RESPIMAT) 2.5 mcg/actuation inhaler   No Yes   Sig: Take 1 Puff by inhalation daily.   traZODone (DESYREL) 50 mg tablet   No No   Sig: Take 1 Tab by mouth nightly as needed for Sleep.      Facility-Administered Medications: None            REVIEW OF SYSTEMS:     []  Unable to obtain  ROS due to  [] mental status change  [] sedated   [] intubated   [x] Total of 12 systems reviewed as follows:  Constitutional: negative fever, negative chills, negative weight loss  Eyes:   negative visual changes  ENT:   negative sore throat, tongue or lip swelling  Respiratory: Positive for cough, shortness of breath and wheezing  Cards:  negative for chest pain, palpitations, lower extremity edema  GI:   Positive for abd pain , diarrhea nausea and vomiting   Genitourinary: negative for frequency, dysuria  Integument:  negative for rash and pruritus  Hematologic:  negative for easy bruising and gum/nose bleeding  Musculoskel: negative for myalgias,  back pain and muscle weakness  Neurological:  negative for headaches, dizziness, vertigo  Behavl/Psych: negative for feelings of anxiety, depression       Objective:   VITALS:    Visit Vitals  BP (!) 160/109   Pulse 97   Temp 98.5 ??F (36.9 ??C)   Resp 19   Ht 5' 8"  (1.727 m)   Wt 86.2 kg (190 lb)   SpO2 99%   BMI 28.89 kg/m??     Temp (24hrs), Avg:98.5 ??F (36.9 ??C), Min:98.3 ??F (36.8 ??C), Max:98.8 ??F (37.1 ??C)      PHYSICAL EXAM: (Seen with PPE , gloves , gown , mask n95 and goggles )  General:    Alert, cooperative, no distress, appears stated age.     Head:   Normocephalic, without obvious abnormality, atraumatic.  Eyes:   Conjunctivae clear, anicteric sclerae.  Pupils are equal   Nose:  Nares normal. No drainage or sinus tenderness.  Throat:    Lips, mucosa, and tongue normal.  No Thrush  Neck:  Supple, symmetrical,  no adenopathy, thyroid: non tender    no carotid bruit and no JVD.  Back:    Symmetric,  No CVA tenderness.  Lungs:   Clear to auscultation bilaterally.  No Wheezing or Rhonchi. No rales.  Chest wall:  No tenderness or deformity. No Accessory muscle use.  Heart:   Regular rate and rhythm,  no murmur, rub or gallop.  Abdomen:   Tender in the left lower abdomen , no rebound or guarding , BS present   Extremities: Extremities normal, atraumatic, No cyanosis.  No edema. No clubbing  Skin:     Texture, turgor normal.  No rashes or lesions.  Not Jaundiced  Psych:  Anxious .  Neurologic: EOMs intact. No facial asymmetry. No aphasia or slurred speech. Normal  strength, Alert and oriented X 3.       LAB DATA REVIEWED:    Recent Results (from the past 12 hour(s))   CBC WITH AUTOMATED DIFF    Collection Time: 05/07/19  1:55 PM   Result Value Ref Range    WBC 21.1 (H) 4.0 - 11.0 1000/mm3    RBC 4.91 3.60 - 5.20 M/uL    HGB 14.8 13.0 - 17.2 gm/dl    HCT 43.7 37.0 - 50.0 %    MCV 89.0 80.0 - 98.0 fL    MCH 30.1 25.4 - 34.6 pg    MCHC 33.9 30.0 - 36.0 gm/dl    PLATELET 303 140 - 450 1000/mm3    MPV 11.0 (H) 6.0 - 10.0 fL    RDW-SD 43.6 36.4 - 46.3      NRBC 0 0 - 0      IMMATURE GRANULOCYTES 0.4 0.0 - 3.0 %    NEUTROPHILS 76.9 (H) 34 - 64 %    LYMPHOCYTES 17.0 (L) 28 - 48 %    MONOCYTES 4.9 1 - 13 %    EOSINOPHILS 0.5 0 - 5 %    BASOPHILS 0.3 0 - 3 %   METABOLIC PANEL, COMPREHENSIVE    Collection Time: 05/07/19  1:55 PM   Result Value Ref Range    Sodium 136 136 - 145 mEq/L    Potassium 4.3 3.5 - 5.1 mEq/L    Chloride 105 98 - 107 mEq/L    CO2 26 21 - 32 mEq/L    Glucose 159 (H) 74 - 106 mg/dl    BUN 24 7 - 25 mg/dl    Creatinine 1.2 0.6 - 1.3 mg/dl    GFR est AA 58.0      GFR est non-AA 48      Calcium 9.8 8.5 - 10.1 mg/dl    AST (SGOT) 7 (L) 15 - 37 U/L    ALT (SGPT) 16 12 - 78 U/L     Alk. phosphatase 108 45 - 117 U/L    Bilirubin, total 0.5 0.2 - 1.0 mg/dl    Protein, total 9.2 (H) 6.4 - 8.2 gm/dl    Albumin 4.2 3.4 - 5.0 gm/dl    Anion gap 6 5 - 15 mmol/L   LIPASE    Collection Time: 05/07/19  1:55 PM   Result Value Ref Range    Lipase 90 73 - 393 U/L   POC URINE MACROSCOPIC    Collection Time: 05/07/19  3:46 PM   Result Value Ref Range    Glucose Negative NEGATIVE,Negative mg/dl    Bilirubin Small (A) NEGATIVE,Negative      Ketone Negative NEGATIVE,Negative mg/dl    Specific gravity >=1.030 1.005 - 1.030      Blood Moderate (A) NEGATIVE,Negative      pH (UA) 5.0 5 - 9      Protein >=300 (A) NEGATIVE,Negative mg/dl    Urobilinogen 0.2 0.0 - 1.0 EU/dl    Nitrites Negative NEGATIVE,Negative      Leukocyte Esterase Negative NEGATIVE,Negative      Color Amber      Appearance Cloudy     COVID-19 (INPATIENT TESTING)    Collection Time: 05/07/19  7:00 PM    Specimen: NASOPHARYNGEAL SWAB   Result Value Ref Range    COVID-19 NEGATIVE NEGATIVE  IMAGING RESULTS:    Xr Chest Sngl V    Result Date: 05/07/2019  EXAM:  Chest AP INDICATIONS: SOB, hypoxia COMPARISON: Most recently 03/13/2019. FINDINGS: There is an unchanged density in the left midlung zone just cranial to the known left lower lobe nodule. This was shown to be atelectasis on previous CT. The nodule is also unchanged in size. No new abnormalities are prominent bullous disease at the left lung apex. Normal cardiomediastinal contours.     IMPRESSION: 1.  No acute cardiopulmonary process. 2.  Stable atelectasis and nodule in the left midlung zone. 3.  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     Ct Abd Pelv W Cont    Result Date: 05/07/2019  DICOM format image data is available to non-affiliated external healthcare facilities or entities on a secure, media free, reciprocally searchable basis with patient authorization for 12 months following the date of the study. Clinical history: Left lower quadrant pain, elevated blood blood  cells EXAMINATION: CT scan of the abdomen and pelvis with intravenous contrast 05/07/2019. 5 mm spiral scanning is performed from the costophrenic angles to the symphysis pubis. Coronal and sagittal reconstruction imaging has been obtained. Correlation: 05/11/2018 and 02/22/2018 FINDINGS: There is respiratory motion artifact. There are bullous changes lung bases. Osteopenia and degenerative changes of the spine. Right hip prosthesis. Liver, gallbladder, spleen, adrenal glands are unremarkable. 1.6 and 1.1 cm splenic hypodensities unchanged from prior study. 2 mm nonobstructing right nephrolithiasis. Left kidney and bladder are unremarkable. Uterus is absent. There are colonic diverticuli. Terminal ileum, appendix, stomach and small bowel loops are within normal limits. Atherosclerotic calcification of the abdominal aorta. No dominant lymph node enlargement or free fluid.     IMPRESSION: 1. 2 mm nonobstructing right nephrolithiasis. 2. Small stable right renal cyst. 3. Diverticulosis. 4. Hysterectomy. 5. 1.6 and 1.1 cm stable splenic lesions.         Care Plan discussed with:     [x] Patient   [] Family    [] ED Care Manager  [] ED Doc   [] Specialist :    Total care time spent on reviewing the case/data/notes/EMR,examining the patient,documentation,coordinating care with nurses and consultants is 40 minutes.       ___________________________________________________  Admitting Physician: Charlaine Dalton, MD     Dragon medical dictation software was used for portions of this report.  Unintended voice transcription errors may have occurred.

## 2019-05-07 NOTE — ED Notes (Signed)
Attempted to call report but nurse is in patient's room and will call back.

## 2019-05-07 NOTE — ED Provider Notes (Signed)
[facility]  Emergency Department Treatment Report    Patient: [name] FantasiaMonica [name] Age: 68 y.o. Sex: female    Date of Birth: 08/15/1950 Admit Date: 05/07/2019 PCP: [name] LiesBrock, Monique, PA-C   MRN: [id number]  CSN: [id number]     Room: 6728/6728 Time Dictated: 1:53 PM      Dragon medical dictation software was used for portions of this report.  Unintended transcription errors may occur.    Chief Complaint   Shortness of breath, abdominal pain.  History of Present Illness   68 y.o. female with past medical history as below.  She presents today for feeling generally unwell ongoing now for the last 48 to 72 hours.  Described first feeling nauseous, then having vomiting, then diarrhea.  She has developed in the interim progressive abdominal pain, pain to be worse on the left side compared to the right side.  Lower greater than upper.  Nothing seems to make it better, though nothing is making it any worse.  She has not had this, pain before per her.  She has had vomiting, the emesis was nonbloody and nonbilious.  She has also developed shortness of breath with cough, cough is nonproductive.  She has not had any fever, denies any pain with inspiration, has no swelling to her legs or her calves.  Does have COPD, symptoms feel consistent with her previous COPD exacerbations in regards to her shortness of breath.    Review of Systems   Review of Systems   Constitutional: Negative for chills, diaphoresis, fever and weight loss.   HENT: Negative for congestion and sore throat.    Respiratory: Positive for cough, shortness of breath and wheezing. Negative for sputum production.    Cardiovascular: Negative for chest pain, palpitations, orthopnea and leg swelling.   Gastrointestinal: Positive for abdominal pain, diarrhea, nausea and vomiting.   Genitourinary: Negative for dysuria, frequency and urgency.   Musculoskeletal: Negative for back pain and falls.   Skin: Negative for rash.    Neurological: Negative for dizziness and headaches.   Endo/Heme/Allergies: Does not bruise/bleed easily.   Psychiatric/Behavioral: Negative for substance abuse.       Past Medical/Surgical History     Past Medical History:   Diagnosis Date   ??? Arthritis    ??? 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 DM2 (diabetes mellitus, type 2) (HCC)    ??? GERD (gastroesophageal reflux disease)    ??? Hepatitis C     HEP C   ??? HTN (hypertension)    ??? Lung nodule, multiple     (CT 10/2016) Small left upper lobe and 1.7 x 1.6 cm left lower lobe nodules not significantly changed from 07/23/2016 and 03/22/2016   ??? Marijuana use    ??? PTSD (post-traumatic stress disorder)     [place]   ??? Thoracic ascending aortic aneurysm (HCC)     4.4cm noted on CT Chest (10/2016)   ??? Thyroid nodule     2.5 cm stable right thyroid nodule (CT 10/2016)     Past Surgical History:   Procedure Laterality Date   ??? HX ENDOSCOPY     ??? HX HERNIA REPAIR  2019    x2   ??? HX HIP REPLACEMENT Right 01/29/2015   ??? HX HIP REPLACEMENT     ??? HX HYSTERECTOMY  2010    hysterectomy       Social History     Social History     Socioeconomic History   ??? Marital status: MARRIED     Spouse name: Not on file   ??? Number of children: Not on file   ??? Years of education: Not on file   ??? Highest education level: Not on file   Tobacco Use   ??? Smoking status: Former Smoker   ??? Smokeless tobacco: Never Used   ??? Tobacco comment: reported as quit smoking around 1990s per spouse   Substance and Sexual Activity   ??? Alcohol use: No   ??? Drug use: Not Currently     Types: Marijuana     Comment: states she has not used marijuana in months   ??? Sexual activity: Never       Family History     Family History   Problem Relation Age of Onset   ??? Hypertension Mother    ??? Other Mother         OSA   ??? Cancer Father         suspected leukemia per pt's spouse    ??? Cancer Maternal Aunt    ??? Alcohol abuse Maternal Grandfather        Current Medications     Current Facility-Administered Medications   Medication Dose Route Frequency Provider Last Rate Last Dose   ??? cetirizine (ZYRTEC) tablet 10 mg  10 mg Oral DAILY Roderic Scarce, MD   10 mg at 05/08/19 1610   ??? cloNIDine HCL (CATAPRES) tablet 0.3 mg  0.3 mg Oral BID Roderic Scarce, MD   0.3 mg at 05/08/19 0458   ??? dilTIAZem ER (CARDIZEM CD) capsule 240 mg  240 mg Oral DAILY Roderic Scarce, MD   240 mg at 05/08/19 0918   ??? fluticasone-vilanterol (BREO ELLIPTA) 119mcg-25mcg/puff  1 Puff Inhalation DAILY Roderic Scarce, MD   1 Puff at 05/08/19 [phone]   ??? gabapentin (NEURONTIN) capsule 300 mg  300 mg Oral TID Roderic Scarce, MD   300 mg at 05/08/19 0917   ??? guaiFENesin ER (MUCINEX) tablet 600 mg  600 mg Oral Q12H Roderic Scarce, MD   600 mg at 05/08/19 5409   ??? losartan (COZAAR) tablet 50 mg  50 mg Oral DAILY Roderic Scarce, MD   50 mg at 05/08/19 8119   ??? methocarbamoL (ROBAXIN) tablet 500 mg  500 mg Oral QID Roderic Scarce, MD   500 mg at 05/08/19 1478   ??? montelukast (SINGULAIR) tablet 10 mg  10 mg Oral QHS Roderic Scarce, MD   10 mg at 05/07/19 2243   ??? rosuvastatin (CRESTOR) tablet 20 mg  20 mg Oral QHS Roderic Scarce, MD   20 mg at 05/07/19 2242   ??? spironolactone (ALDACTONE) tablet 100 mg  100 mg Oral DAILY Roderic Scarce, MD   100 mg at 05/08/19 0917   ??? tiotropium bromide (SPIRIVA RESPIMAT) 2.5 mcg /actuation  1 Puff Inhalation DAILY Roderic Scarce, MD   1 Puff at 05/08/19 [phone]   ??? traZODone (DESYREL) tablet 50 mg  50 mg Oral QHS PRN Roderic Scarce, MD   50 mg at 05/08/19 0008   ??? sodium chloride (NS) flush 5-10 mL  5-10 mL IntraVENous Q8H Roderic Scarce, MD   10 mL at 05/08/19 2130   ??? sodium chloride (NS) flush 5-10 mL  5-10 mL IntraVENous PRN Roderic Scarce, MD       ??? naloxone (NARCAN) injection 0.1  mg  0.1 mg IntraVENous PRN Roderic Scarce, MD       ??? acetaminophen (TYLENOL) tablet 650 mg  650 mg Oral Q6H PRN Roderic Scarce, MD   650 mg at 05/08/19 0601   ??? HYDROcodone-acetaminophen (NORCO) 5-325 mg per tablet 1 Tab  1 Tab Oral Q4H PRN Roderic Scarce, MD   1 Tab at 05/08/19 [phone]   ??? morphine injection 1 mg  1 mg IntraVENous Q4H PRN Roderic Scarce, MD   1 mg at 05/08/19 0602   ??? alum-mag hydroxide-simeth (MYLANTA) oral suspension 30 mL  30 mL Oral Q4H PRN Roderic Scarce, MD       ??? albuterol (PROVENTIL VENTOLIN) nebulizer solution 2.5 mg  2.5 mg Nebulization Q6H PRN Roderic Scarce, MD       ??? heparin (porcine) injection 5,000 Units  5,000 Units SubCUTAneous Q8H Roderic Scarce, MD   5,000 Units at 05/08/19 6073   ??? methylPREDNISolone (PF) (SOLU-MEDROL) injection 40 mg  40 mg IntraVENous Q8H Roderic Scarce, MD   40 mg at 05/08/19 7106   ??? hydrALAZINE (APRESOLINE) 20 mg/mL injection 10 mg  10 mg IntraVENous Q6H PRN Roderic Scarce, MD   10 mg at 05/08/19 1108   ??? dextrose (D50) infusion 5-25 g  10-50 mL IntraVENous PRN Roderic Scarce, MD       ??? glucagon (GLUCAGEN) injection 1 mg  1 mg IntraMUSCular PRN Roderic Scarce, MD       ??? insulin glargine (LANTUS) injection 1-100 Units  1-100 Units SubCUTAneous QHS Roderic Scarce, MD   14 Units at 05/07/19 2244   ??? insulin lispro (HUMALOG) injection 1-100 Units  1-100 Units SubCUTAneous AC&HS Roderic Scarce, MD   2 Units at 05/08/19 1012   ??? insulin lispro (HUMALOG) injection 1-100 Units  1-100 Units SubCUTAneous PRN Roderic Scarce, MD           Allergies     Allergies   Allergen Reactions   ??? Chocolate [Cocoa] Sneezing     Confirms allergy but reports "I still eat it"       Physical Exam     Visit Vitals  BP (!) 156/98 (BP 1 Location: Right leg, BP Patient Position: Supine)   Pulse (!) 115   Temp 99.7 ??F (37.6 ??C)   Resp 20   Ht 5\' 8"  (1.727 m)   Wt 93.2 kg (205 lb 7.5 oz)   SpO2 91%   Breastfeeding No   BMI 31.24 kg/m??     Physical Exam  Constitutional:       General: She is in acute distress.      Appearance: She is not diaphoretic.   HENT:      Head: Normocephalic and atraumatic.       Mouth/Throat:      Mouth: Mucous membranes are moist.   Eyes:      General:         Right eye: No discharge.         Left eye: No discharge.      Conjunctiva/sclera: Conjunctivae normal.      Pupils: Pupils are equal, round, and reactive to light.   Neck:      Musculoskeletal: Normal range of motion and neck supple.      Trachea: No tracheal deviation.   Cardiovascular:      Rate and Rhythm: Regular rhythm. Tachycardia present.      Heart sounds: Normal heart sounds. No murmur. No friction rub. No gallop.    Pulmonary:  Effort: Pulmonary effort is normal. No respiratory distress.      Breath sounds: Normal breath sounds. No stridor. No wheezing or rales.   Abdominal:      General: Bowel sounds are normal. There is no distension.      Palpations: Abdomen is soft. There is no mass.      Tenderness: There is abdominal tenderness. There is no guarding or rebound.      Comments: Tender to palpation in the left lower quadrant, no rebound, no guarding, there is no appreciable pulsatile mass.   Musculoskeletal: Normal range of motion.         General: No deformity.   Skin:     General: Skin is warm and dry.      Coloration: Skin is not pale.      Findings: No erythema or rash.   Neurological:      General: No focal deficit present.      Mental Status: She is alert.   Psychiatric:         Mood and Affect: Mood and affect normal.         Impression and Management Plan   Patient with symptoms as above, given her focal abdominal pain will be concerned for colitis/diverticulitis will send CBC, CMP, lipase, CT of the abdomen pelvis, regards to her shortness of breath, oxygen saturations were in the 80s, now in the high 90s/100% on a nasal cannula, will give nebulizer therapy, get screening chest x-ray, proceed accordingly.  Fairly impressive hypertension on arrival, though she is writhing around the bed, we will recheck her blood pressure monitor, will intervene if it remains elevated.    Diagnostic Studies   Lab:    Recent Results (from the past 12 hour(s))   CBC WITH AUTOMATED DIFF    Collection Time: 05/08/19 12:00 AM   Result Value Ref Range    WBC 21.1 (H) 4.0 - 11.0 1000/mm3    RBC 5.20 3.60 - 5.20 M/uL    HGB 15.1 13.0 - 17.2 gm/dl    HCT 16.1 09.6 - 04.5 %    MCV 89.2 80.0 - 98.0 fL    MCH 29.0 25.4 - 34.6 pg    MCHC 32.5 30.0 - 36.0 gm/dl    PLATELET 409 811 - 914 1000/mm3    MPV 11.0 (H) 6.0 - 10.0 fL    RDW-SD 43.8 36.4 - 46.3      NRBC 0 0 - 0      IMMATURE GRANULOCYTES 0.7 0.0 - 3.0 %    NEUTROPHILS 93.5 (H) 34 - 64 %    LYMPHOCYTES 4.6 (L) 28 - 48 %    MONOCYTES 1.1 1 - 13 %    EOSINOPHILS 0.0 0 - 5 %    BASOPHILS 0.1 0 - 3 %   METABOLIC PANEL, BASIC    Collection Time: 05/08/19 12:00 AM   Result Value Ref Range    Sodium 138 136 - 145 mEq/L    Potassium 5.0 3.5 - 5.1 mEq/L    Chloride 103 98 - 107 mEq/L    CO2 23 21 - 32 mEq/L    Glucose 191 (H) 74 - 106 mg/dl    BUN 22 7 - 25 mg/dl    Creatinine 1.1 0.6 - 1.3 mg/dl    GFR est AA >78.2      GFR est non-AA 53      Calcium 10.0 8.5 - 10.1 mg/dl    Anion gap 12 5 - 15 mmol/L  TROPONIN I    Collection Time: 05/08/19 12:00 AM   Result Value Ref Range    Troponin-I <0.015 0.000 - 0.045 ng/ml   TROPONIN I    Collection Time: 05/08/19 12:42 AM   Result Value Ref Range    Troponin-I <0.015 0.000 - 0.045 ng/ml   GLUCOSE, POC    Collection Time: 05/08/19  5:49 AM   Result Value Ref Range    Glucose (POC) 168 (H) 65 - 105 mg/dL   EKG, 12 LEAD, INITIAL    Collection Time: 05/08/19  5:53 AM   Result Value Ref Range    Ventricular Rate 132 BPM    Atrial Rate 131 BPM    P-R Interval 144 ms    QRS Duration 66 ms    Q-T Interval 266 ms    QTC Calculation (Bezet) 394 ms    Calculated P Axis 66 degrees    Calculated R Axis 53 degrees    Calculated T Axis 120 degrees    Diagnosis       Sinus Tachycardia  Nonspecific ST and T wave abnormality  Abnormal ECG  When compared with ECG of 07-May-2019 13:45, (Unconfirmed)  No significant change since prior tracing   Confirmed by Zeb Comfort, M.D., Leanna Battles [phone]) on 05/08/2019 9:15:58 AM     LACTIC ACID    Collection Time: 05/08/19  6:51 AM   Result Value Ref Range    Lactic Acid 2.2 (H) 0.4 - 2.0 mmol/L   PROCALCITONIN    Collection Time: 05/08/19  6:51 AM   Result Value Ref Range    PROCALCITONIN <0.05 0.00 - 0.50 ng/ml   GLUCOSE, POC    Collection Time: 05/08/19  7:44 AM   Result Value Ref Range    Glucose (POC) 171 (H) 65 - 105 mg/dL       Imaging:    Xr Chest Sngl V    Result Date: 05/07/2019  EXAM:  Chest AP INDICATIONS: SOB, hypoxia COMPARISON: Most recently 03/13/2019. FINDINGS: There is an unchanged density in the left midlung zone just cranial to the known left lower lobe nodule. This was shown to be atelectasis on previous CT. The nodule is also unchanged in size. No new abnormalities are prominent bullous disease at the left lung apex. Normal cardiomediastinal contours.     IMPRESSION: 1.  No acute cardiopulmonary process. 2.  Stable atelectasis and nodule in the left midlung zone. 3.  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     Ct Abd Pelv W Cont    Result Date: 05/07/2019  DICOM format image data is available to non-affiliated external healthcare facilities or entities on a secure, media free, reciprocally searchable basis with patient authorization for 12 months following the date of the study. Clinical history: Left lower quadrant pain, elevated blood blood cells EXAMINATION: CT scan of the abdomen and pelvis with intravenous contrast 05/07/2019. 5 mm spiral scanning is performed from the costophrenic angles to the symphysis pubis. Coronal and sagittal reconstruction imaging has been obtained. Correlation: 05/11/2018 and 02/22/2018 FINDINGS: There is respiratory motion artifact. There are bullous changes lung bases. Osteopenia and degenerative changes of the spine. Right hip prosthesis. Liver, gallbladder, spleen, adrenal glands are unremarkable. 1.6 and 1.1 cm splenic hypodensities unchanged from  prior study. 2 mm nonobstructing right nephrolithiasis. Left kidney and bladder are unremarkable. Uterus is absent. There are colonic diverticuli. Terminal ileum, appendix, stomach and small bowel loops are within normal limits. Atherosclerotic calcification of the abdominal aorta. No dominant  lymph node enlargement or free fluid.     IMPRESSION: 1. 2 mm nonobstructing right nephrolithiasis. 2. Small stable right renal cyst. 3. Diverticulosis. 4. Hysterectomy. 5. 1.6 and 1.1 cm stable splenic lesions.           Medical Decision Making/ED Course     ED Course as of May 07 1141   Tue May 07, 2019   1513 Patient is feeling much better, lungs are improved bilaterally, belly pain is improving but persistent.  She has a fairly impressive white count of 21.1, will send for CT of the abdomen and pelvis.  In regards to her blood pressure that is coming down impressively on its own, she is down to 150/91 currently.    [JR]      ED Course User Index  [JR] Mora Bellman, MD     None repeat examination prior to signout, CT is pending.  Patient was signed out to Dr. Pamella Pert with reevaluation and CT results pending.  On repeat evaluation after initial reevaluation she has had some increase in her work of breathing, on reexamination her lungs are persistently wheezy, will give her another dose of albuterol.  Given her acute hypoxemia on arrival, her persistent pain and wheezing, will plan for hospital admission pending CT results.    Final Diagnosis       ICD-10-CM ICD-9-CM   1. Chronic obstructive pulmonary disease with acute exacerbation (HCC)  J44.1 491.21   2. Hypoxia  R09.02 799.02   3. Leukocytosis, unspecified type  D72.829 288.60     Disposition   Admission    Mora Bellman, MD  May 08, 2019    My signature above authenticates this document and my orders, the final    diagnosis (es), discharge prescription (s), and instructions in the Epic    record.  If you have any questions please contact [phone].

## 2019-05-07 NOTE — Progress Notes (Signed)
TRANSFER - IN REPORT:    Verbal report received from Jessica Laguerre, RN  (name) on Talina Beougher  being received from ED(unit) for routine progression of care      Report consisted of patient???s Situation, Background, Assessment and   Recommendations(SBAR).     Information from the following report(s) ED Summary, Intake/Output, MAR and Cardiac Rhythm SR was reviewed with the receiving nurse.    Opportunity for questions and clarification was provided.      Assessment completed upon patient???s arrival to unit and care assumed.           Patient transferred to room via stretcher. Patient alert and orientated. Patient wearing 2L NC. Patient orientated to room and assessments completed.  Patient placed on Telebox #[id number]. RN verified Tele box. Patient running sinus tachycardia 107. Patient gave RN verbal permission to update her husband over the phone. Will continue to monitor.

## 2019-05-07 NOTE — ED Notes (Signed)
6:53 PM Turnover from Dr. Mora Bellman.  Awaiting for abdominal pelvis CT for further admission due to the COPD exacerbation associated to hypoxia.    7:14 PM   Recent Results (from the past 12 hour(s))   CBC WITH AUTOMATED DIFF    Collection Time: 05/07/19  1:55 PM   Result Value Ref Range    WBC 21.1 (H) 4.0 - 11.0 1000/mm3    RBC 4.91 3.60 - 5.20 M/uL    HGB 14.8 13.0 - 17.2 gm/dl    HCT 43.7 37.0 - 50.0 %    MCV 89.0 80.0 - 98.0 fL    MCH 30.1 25.4 - 34.6 pg    MCHC 33.9 30.0 - 36.0 gm/dl    PLATELET 303 140 - 450 1000/mm3    MPV 11.0 (H) 6.0 - 10.0 fL    RDW-SD 43.6 36.4 - 46.3      NRBC 0 0 - 0      IMMATURE GRANULOCYTES 0.4 0.0 - 3.0 %    NEUTROPHILS 76.9 (H) 34 - 64 %    LYMPHOCYTES 17.0 (L) 28 - 48 %    MONOCYTES 4.9 1 - 13 %    EOSINOPHILS 0.5 0 - 5 %    BASOPHILS 0.3 0 - 3 %   METABOLIC PANEL, COMPREHENSIVE    Collection Time: 05/07/19  1:55 PM   Result Value Ref Range    Sodium 136 136 - 145 mEq/L    Potassium 4.3 3.5 - 5.1 mEq/L    Chloride 105 98 - 107 mEq/L    CO2 26 21 - 32 mEq/L    Glucose 159 (H) 74 - 106 mg/dl    BUN 24 7 - 25 mg/dl    Creatinine 1.2 0.6 - 1.3 mg/dl    GFR est AA 58.0      GFR est non-AA 48      Calcium 9.8 8.5 - 10.1 mg/dl    AST (SGOT) 7 (L) 15 - 37 U/L    ALT (SGPT) 16 12 - 78 U/L    Alk. phosphatase 108 45 - 117 U/L    Bilirubin, total 0.5 0.2 - 1.0 mg/dl    Protein, total 9.2 (H) 6.4 - 8.2 gm/dl    Albumin 4.2 3.4 - 5.0 gm/dl    Anion gap 6 5 - 15 mmol/L   LIPASE    Collection Time: 05/07/19  1:55 PM   Result Value Ref Range    Lipase 90 73 - 393 U/L   POC URINE MACROSCOPIC    Collection Time: 05/07/19  3:46 PM   Result Value Ref Range    Glucose Negative NEGATIVE,Negative mg/dl    Bilirubin Small (A) NEGATIVE,Negative      Ketone Negative NEGATIVE,Negative mg/dl    Specific gravity >=1.030 1.005 - 1.030      Blood Moderate (A) NEGATIVE,Negative      pH (UA) 5.0 5 - 9      Protein >=300 (A) NEGATIVE,Negative mg/dl    Urobilinogen 0.2 0.0 - 1.0 EU/dl     Nitrites Negative NEGATIVE,Negative      Leukocyte Esterase Negative NEGATIVE,Negative      Color Amber      Appearance Cloudy       Xr Chest Sngl V    Result Date: 05/07/2019  IMPRESSION: 1.  No acute cardiopulmonary process. 2.  Stable atelectasis and nodule in the left midlung zone. 3.  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     Ct  Abd Pelv W Cont    Result Date: 05/07/2019  IMPRESSION: 1. 2 mm nonobstructing right nephrolithiasis. 2. Small stable right renal cyst. 3. Diverticulosis. 4. Hysterectomy. 5. 1.6 and 1.1 cm stable splenic lesions.     7:14 PM Work-up shows leukocytosis associated to hyperglycemia, otherwise unremarkable.  Chest x-ray shows no acute cardiopulmonary process.  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  Stable atelectasis and nodule in the left midlung zone.  Abdominal pelvic CT shows 2 mm nonobstructing right nephrolithiasis.  Stable right renal cyst.  Stable splenic lesions.  COVID-19 test is pending.  Patient was informed of the results.  I will discuss admission with hospital for further treatment of COPD with hypoxia.    7: 50 PM Patient was evaluated by Dr. Winona Legato who admitted to med/tele.       ICD-10-CM ICD-9-CM   1. Chronic obstructive pulmonary disease with acute exacerbation (HCC)  J44.1 491.21   2. Hypoxia  R09.02 799.02   3. Leukocytosis, unspecified type  D72.829 288.60

## 2019-05-07 NOTE — Other (Signed)
TRANSFER - OUT REPORT:    Verbal report given to Jessica, Rn(name) on Shandria Clinch  being transferred to 6728(unit) for routine progression of care       Report consisted of patient???s Situation, Background, Assessment and   Recommendations(SBAR).     Information from the following report(s) ED Summary and Procedure Summary was reviewed with the receiving nurse.    Lines:   Peripheral IV 05/07/19 Left;Lower Cephalic (Active)   Site Assessment Clean, dry, & intact 05/07/19 1355   Phlebitis Assessment 0 05/07/19 1355   Infiltration Assessment 0 05/07/19 1355   Dressing Status Clean, dry, & intact 05/07/19 1355   Dressing Type Transparent 05/07/19 1355   Hub Color/Line Status Pink 05/07/19 1355   Alcohol Cap Used Yes 05/07/19 1355       Peripheral IV 05/07/19 Right Arm (Active)   Site Assessment Clean, dry, & intact 05/07/19 2055   Phlebitis Assessment 0 05/07/19 2055   Infiltration Assessment 0 05/07/19 2055   Dressing Status Clean, dry, & intact 05/07/19 2055   Dressing Type Transparent 05/07/19 2055        Opportunity for questions and clarification was provided.      Patient transported with:   O2 @ 2 liters  Registered Nurse

## 2019-05-07 NOTE — ED Notes (Signed)
Attempted to call report but nurse is in patient's room and will call back.
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
## 2019-05-07 NOTE — Progress Notes (Signed)
 TRANSFER - IN REPORT:    Verbal report received from Harlene Reef, RN  (name) on [name] [name]  being received from ED(unit) for routine progression of care      Report consisted of patient's Situation, Background, Assessment and   Recommendations(SBAR).     Information from the following report(s) ED Summary, Intake/Output, MAR and Cardiac Rhythm SR was reviewed with the receiving nurse.    Opportunity for questions and clarification was provided.      Assessment completed upon patient's arrival to unit and care assumed.           Patient transferred to room via stretcher. Patient alert and orientated. Patient wearing 2L NC. Patient orientated to room and assessments completed.  Patient placed on Telebox #[id number]. RN verified Tele box. Patient running sinus tachycardia 107. Patient gave RN verbal permission to update her husband over the phone. Will continue to monitor.

## 2019-05-07 NOTE — ED Notes (Signed)
6:53 PM Turnover from Dr. Mora Bellman.  Awaiting for abdominal pelvis CT for further admission due to the COPD exacerbation associated to hypoxia.    7:14 PM   Recent Results (from the past 12 hour(s))   CBC WITH AUTOMATED DIFF    Collection Time: 05/07/19  1:55 PM   Result Value Ref Range    WBC 21.1 (H) 4.0 - 11.0 1000/mm3    RBC 4.91 3.60 - 5.20 M/uL    HGB 14.8 13.0 - 17.2 gm/dl    HCT 43.7 37.0 - 50.0 %    MCV 89.0 80.0 - 98.0 fL    MCH 30.1 25.4 - 34.6 pg    MCHC 33.9 30.0 - 36.0 gm/dl    PLATELET 303 140 - 450 1000/mm3    MPV 11.0 (H) 6.0 - 10.0 fL    RDW-SD 43.6 36.4 - 46.3      NRBC 0 0 - 0      IMMATURE GRANULOCYTES 0.4 0.0 - 3.0 %    NEUTROPHILS 76.9 (H) 34 - 64 %    LYMPHOCYTES 17.0 (L) 28 - 48 %    MONOCYTES 4.9 1 - 13 %    EOSINOPHILS 0.5 0 - 5 %    BASOPHILS 0.3 0 - 3 %   METABOLIC PANEL, COMPREHENSIVE    Collection Time: 05/07/19  1:55 PM   Result Value Ref Range    Sodium 136 136 - 145 mEq/L    Potassium 4.3 3.5 - 5.1 mEq/L    Chloride 105 98 - 107 mEq/L    CO2 26 21 - 32 mEq/L    Glucose 159 (H) 74 - 106 mg/dl    BUN 24 7 - 25 mg/dl    Creatinine 1.2 0.6 - 1.3 mg/dl    GFR est AA 58.0      GFR est non-AA 48      Calcium 9.8 8.5 - 10.1 mg/dl    AST (SGOT) 7 (L) 15 - 37 U/L    ALT (SGPT) 16 12 - 78 U/L    Alk. phosphatase 108 45 - 117 U/L    Bilirubin, total 0.5 0.2 - 1.0 mg/dl    Protein, total 9.2 (H) 6.4 - 8.2 gm/dl    Albumin 4.2 3.4 - 5.0 gm/dl    Anion gap 6 5 - 15 mmol/L   LIPASE    Collection Time: 05/07/19  1:55 PM   Result Value Ref Range    Lipase 90 73 - 393 U/L   POC URINE MACROSCOPIC    Collection Time: 05/07/19  3:46 PM   Result Value Ref Range    Glucose Negative NEGATIVE,Negative mg/dl    Bilirubin Small (A) NEGATIVE,Negative      Ketone Negative NEGATIVE,Negative mg/dl    Specific gravity >=1.030 1.005 - 1.030      Blood Moderate (A) NEGATIVE,Negative      pH (UA) 5.0 5 - 9      Protein >=300 (A) NEGATIVE,Negative mg/dl    Urobilinogen 0.2 0.0 - 1.0 EU/dl    Nitrites Negative  NEGATIVE,Negative      Leukocyte Esterase Negative NEGATIVE,Negative      Color Amber      Appearance Cloudy       Xr Chest Sngl V    Result Date: 05/07/2019  IMPRESSION: 1.  No acute cardiopulmonary process. 2.  Stable atelectasis and nodule in the left midlung zone. 3.  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     Ct  Abd Pelv W Cont    Result Date: 05/07/2019  IMPRESSION: 1. 2 mm nonobstructing right nephrolithiasis. 2. Small stable right renal cyst. 3. Diverticulosis. 4. Hysterectomy. 5. 1.6 and 1.1 cm stable splenic lesions.     7:14 PM Work-up shows leukocytosis associated to hyperglycemia, otherwise unremarkable.  Chest x-ray shows no acute cardiopulmonary process.  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  Stable atelectasis and nodule in the left midlung zone.  Abdominal pelvic CT shows 2 mm nonobstructing right nephrolithiasis.  Stable right renal cyst.  Stable splenic lesions.  COVID-19 test is pending.  Patient was informed of the results.  I will discuss admission with hospital for further treatment of COPD with hypoxia.    7: 50 PM Patient was evaluated by Dr. Winona Legato who admitted to med/tele.       ICD-10-CM ICD-9-CM   1. Chronic obstructive pulmonary disease with acute exacerbation (HCC)  J44.1 491.21   2. Hypoxia  R09.02 799.02   3. Leukocytosis, unspecified type  D72.829 288.60

## 2019-05-07 NOTE — H&P (Signed)
Hospitalist Admission History and Physical    NAME:  [name] [name]   DOB:   October 07, 1950   MRN:   [id number]     PCP:  [name] Huh, PA-C  Admission Date/Time:  05/07/2019 8:41 PM  Anticipated Date of Discharge: 05/10/2019  Anticipated Disposition (home, SNF) : HH         Assessment/Plan:      Active Problems:    COPD exacerbation ([place]) (05/07/2019)      Acute hypoxemic respiratory failure ([place]) (05/07/2019)       Acute hypoxic respiratory failure  COPD exacerbation  Hypertension  Type 2 diabetes mellitus  Hyperlipidemia  Peripheral neuropathy  Left lower lobe nodule  History of tobacco abuse  2 mm nonobstructing right nephrolithiasis  ___________________________________________________  PLAN:    Admit the patient to telemetry  Oxygen, nebs, titrate oxygen to keep sats more than 90%  IV steroid  Glucomander monitoring  Continue Singulair, Cardizem, trazodone, Spiriva  CT scan of the abdomen was done for patient's abdominal discomfort, showed 2 mm nonobstructing right nephrolithiasis  Pulmonary consult in the morning    Risk of deterioration:  _0 Low    _1 Moderate  _2 High    Prophylaxis:  _3 Lovenox  _4 Coumadin  _5 Hep SQ  _6 SCD???s  _7 H2B/PPI_8 Eliquis _9 Xarelto     Disposition:  _10 Home w/ Family   _11 HH PT,OT,RN   _12 SNF/LTC   _13 SAH/Rehab         IMPRESSION  IMPRESSION:  1. 2 mm nonobstructing right nephrolithiasis.  2. Small stable right renal cyst.  3. Diverticulosis.  4. Hysterectomy.  5. 1.6 and 1.1 cm stable splenic lesions.    Subjective:   CHIEF COMPLAINT:    Chief Complaint   Patient presents with   ??? Abdominal Pain   ??? Cough       HISTORY OF PRESENT ILLNESS:     [name] is a 68 y.o. OTHER female who presents with not feeling well for the past few hours  Patient said she has been feeling nauseous, had some vomiting and loose bowel movement, developed progressive abdominal pain more on the left than the right  She also has been having shortness of breath with cough which is nonproductive, no fever no chest pain  She has  history of COPD feels that the symptoms are very consistent to her previous COPD exacerbations    Past Medical History:   Diagnosis Date   ??? Arthritis    ??? 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 DM2 (diabetes mellitus, type 2) ([place])    ??? GERD (gastroesophageal reflux disease)    ??? Hepatitis C     HEP C   ??? HTN (hypertension)    ??? Lung nodule, multiple     (CT 10/2016) Small left upper lobe and 1.7 x 1.6 cm left lower lobe nodules not significantly changed from 07/23/2016 and 03/22/2016   ??? Marijuana use    ??? PTSD (post-traumatic stress disorder)     lived through the [place] in 1993   ??? Thoracic ascending aortic aneurysm ([place])     4.4cm noted on CT Chest (10/2016)   ??? Thyroid nodule     2.5 cm stable right thyroid nodule (CT 10/2016)        Past Surgical History:   Procedure Laterality Date   ??? HX ENDOSCOPY     ???  HX HERNIA REPAIR  2019    x2   ??? HX HIP REPLACEMENT Right 01/29/2015   ??? HX HIP REPLACEMENT     ??? HX HYSTERECTOMY  2010    hysterectomy       Social History     Tobacco Use   ??? Smoking status: Former Smoker   ??? Smokeless tobacco: Never Used   ??? Tobacco comment: reported as quit smoking around 1990s per spouse   Substance Use Topics   ??? Alcohol use: No        Family History   Problem Relation Age of Onset   ??? Hypertension Mother    ??? Other Mother         OSA   ??? Cancer Father         suspected leukemia per pt's spouse   ??? Cancer Maternal Aunt    ??? Alcohol abuse Maternal Grandfather         Allergies   Allergen Reactions   ??? Chocolate [Cocoa] Sneezing     Confirms allergy but reports "I still eat it"        Prior to Admission Medications   Prescriptions Last Dose Informant Patient Reported? Taking?   LANTUS SOLOSTAR U-100 INSULIN 100 unit/mL (3 mL) inpn 05/06/2019 at 2000  No Yes   Sig: INJECT 32 UNITS SUBCUTANEOUSLY  NIGHTLY   QUEtiapine SR (SEROQUEL XR) 150 mg sr tablet   Yes Yes    Sig: Take 150 mg by mouth nightly as needed.   albuterol (ACCUNEB) 1.25 mg/3 mL nebu   No Yes   Sig: USE 1 VIAL IN NEBULIZER EVERY 6 HOURS AS NEEDED FOR  SHORTNESS  OF  BREATH,  WHEEZING   butalbital-acetaminophen-caff (FIORICET) 50-300-40 mg per capsule   No Yes   Sig: Take 1 Cap by mouth every four (4) hours as needed for Headache.   cetirizine (ZYRTEC) 10 mg tablet   No Yes   Sig: Take 1 Tab by mouth daily.   cloNIDine HCl (CATAPRES) 0.1 mg tablet 05/06/2019 at 2000  No Yes   Sig: Take 3 Tabs by mouth two (2) times a day.   dilTIAZem CD (CARDIZEM CD) 240 mg ER capsule 05/06/2019 at 1000  No Yes   Sig: Take 1 Cap by mouth daily.   fluticasone propion-salmeterol (ADVAIR DISKUS) 100-50 mcg/dose diskus inhaler   No Yes   Sig: Take 1 Puff by inhalation two (2) times a day.   fluticasone propionate (FLONASE) 50 mcg/actuation nasal spray   No Yes   Sig: 2 Sprays by Both Nostrils route daily.   Patient taking differently: 2 Sprays by Both Nostrils route daily as needed.   gabapentin (NEURONTIN) 300 mg capsule   No Yes   Sig: Take 1 Cap by mouth three (3) times daily. Max Daily Amount: 900 mg.   guaiFENesin ER (MUCINEX) 600 mg ER tablet   No Yes   Sig: Take 1 Tab by mouth every twelve (12) hours.   ibuprofen (MOTRIN) 600 mg tablet   No Yes   Sig: Take 1 Tab by mouth every six (6) hours as needed for Pain.   losartan (COZAAR) 50 mg tablet 05/06/2019 at 1000  No Yes   Sig: Take 1 Tab by mouth daily.   magnesium oxide (MAG-OX) 400 mg tablet   Yes Yes   Sig: Take 400 mg by mouth daily.   metFORMIN ER (GLUCOPHAGE XR) 500 mg tablet 05/06/2019 at 2000  No Yes   Sig: TAKE 1  TABLET TWICE DAILY   methocarbamoL (Robaxin) 500 mg tablet   No Yes   Sig: Take 1 Tab by mouth four (4) times daily.   montelukast (SINGULAIR) 10 mg tablet   No Yes   Sig: TAKE 1 TABLET EVERY DAY   polyethylene glycol (MIRALAX) 17 gram packet   No Yes   Sig: Take 1 Packet by mouth daily.   Patient taking differently: Take 17 g by mouth daily as needed for  Constipation.   rosuvastatin (CRESTOR) 5 mg tablet   No Yes   Sig: TAKE 1 TABLET BY MOUTH NIGHTLY   spironolactone (ALDACTONE) 100 mg tablet 05/06/2019 at 1000  No Yes   Sig: TAKE 1 TABLET EVERY DAY   tiotropium bromide (SPIRIVA RESPIMAT) 2.5 mcg/actuation inhaler   No Yes   Sig: Take 1 Puff by inhalation daily.   traZODone (DESYREL) 50 mg tablet   No No   Sig: Take 1 Tab by mouth nightly as needed for Sleep.      Facility-Administered Medications: None            REVIEW OF SYSTEMS:     _0  Unable to obtain  ROS due to  _1 mental status change  _2 sedated   _3 intubated   _4 Total of 12 systems reviewed as follows:  Constitutional: negative fever, negative chills, negative weight loss  Eyes:   negative visual changes  ENT:   negative sore throat, tongue or lip swelling  Respiratory: Positive for cough, shortness of breath and wheezing  Cards:  negative for chest pain, palpitations, lower extremity edema  GI:   Positive for abd pain , diarrhea nausea and vomiting   Genitourinary: negative for frequency, dysuria  Integument:  negative for rash and pruritus  Hematologic:  negative for easy bruising and gum/nose bleeding  Musculoskel: negative for myalgias,  back pain and muscle weakness  Neurological:  negative for headaches, dizziness, vertigo  Behavl/Psych: negative for feelings of anxiety, depression       Objective:   VITALS:    Visit Vitals  BP (!) 160/109   Pulse 97   Temp 98.5 ??F (36.9 ??C)   Resp 19   Ht _5  (1.727 m)   Wt 86.2 kg (190 lb)   SpO2 99%   BMI 28.89 kg/m??     Temp (24hrs), Avg:98.5 ??F (36.9 ??C), Min:98.3 ??F (36.8 ??C), Max:98.8 ??F (37.1 ??C)      PHYSICAL EXAM: (Seen with PPE , gloves , gown , mask n95 and goggles )  General:    Alert, cooperative, no distress, appears stated age.     Head:   Normocephalic, without obvious abnormality, atraumatic.  Eyes:   Conjunctivae clear, anicteric sclerae.  Pupils are equal  Nose:  Nares normal. No drainage or sinus tenderness.  Throat:    Lips, mucosa, and tongue  normal.  No Thrush  Neck:  Supple, symmetrical,  no adenopathy, thyroid: non tender    no carotid bruit and no JVD.  Back:    Symmetric,  No CVA tenderness.  Lungs:   Clear to auscultation bilaterally.  No Wheezing or Rhonchi. No rales.  Chest wall:  No tenderness or deformity. No Accessory muscle use.  Heart:   Regular rate and rhythm,  no murmur, rub or gallop.  Abdomen:   Tender in the left lower abdomen , no rebound or guarding , BS present   Extremities: Extremities normal, atraumatic, No cyanosis.  No edema. No clubbing  Skin:     Texture, turgor normal.  No rashes or lesions.  Not Jaundiced  Psych:  Anxious .  Neurologic: EOMs intact. No facial asymmetry. No aphasia or slurred speech. Normal  strength, Alert and oriented X 3.       LAB DATA REVIEWED:    Recent Results (from the past 12 hour(s))   CBC WITH AUTOMATED DIFF    Collection Time: 05/07/19  1:55 PM   Result Value Ref Range    WBC 21.1 (H) 4.0 - 11.0 1000/mm3    RBC 4.91 3.60 - 5.20 M/uL    HGB 14.8 13.0 - 17.2 gm/dl    HCT 43.7 37.0 - 50.0 %    MCV 89.0 80.0 - 98.0 fL    MCH 30.1 25.4 - 34.6 pg    MCHC 33.9 30.0 - 36.0 gm/dl    PLATELET 303 140 - 450 1000/mm3    MPV 11.0 (H) 6.0 - 10.0 fL    RDW-SD 43.6 36.4 - 46.3      NRBC 0 0 - 0      IMMATURE GRANULOCYTES 0.4 0.0 - 3.0 %    NEUTROPHILS 76.9 (H) 34 - 64 %    LYMPHOCYTES 17.0 (L) 28 - 48 %    MONOCYTES 4.9 1 - 13 %    EOSINOPHILS 0.5 0 - 5 %    BASOPHILS 0.3 0 - 3 %   METABOLIC PANEL, COMPREHENSIVE    Collection Time: 05/07/19  1:55 PM   Result Value Ref Range    Sodium 136 136 - 145 mEq/L    Potassium 4.3 3.5 - 5.1 mEq/L    Chloride 105 98 - 107 mEq/L    CO2 26 21 - 32 mEq/L    Glucose 159 (H) 74 - 106 mg/dl    BUN 24 7 - 25 mg/dl    Creatinine 1.2 0.6 - 1.3 mg/dl    GFR est AA 58.0      GFR est non-AA 48      Calcium 9.8 8.5 - 10.1 mg/dl    AST (SGOT) 7 (L) 15 - 37 U/L    ALT (SGPT) 16 12 - 78 U/L    Alk. phosphatase 108 45 - 117 U/L    Bilirubin, total 0.5 0.2 - 1.0 mg/dl    Protein, total 9.2  (H) 6.4 - 8.2 gm/dl    Albumin 4.2 3.4 - 5.0 gm/dl    Anion gap 6 5 - 15 mmol/L   LIPASE    Collection Time: 05/07/19  1:55 PM   Result Value Ref Range    Lipase 90 73 - 393 U/L   POC URINE MACROSCOPIC    Collection Time: 05/07/19  3:46 PM   Result Value Ref Range    Glucose Negative NEGATIVE,Negative mg/dl    Bilirubin Small (A) NEGATIVE,Negative      Ketone Negative NEGATIVE,Negative mg/dl    Specific gravity >=1.030 1.005 - 1.030      Blood Moderate (A) NEGATIVE,Negative      pH (UA) 5.0 5 - 9      Protein >=300 (A) NEGATIVE,Negative mg/dl    Urobilinogen 0.2 0.0 - 1.0 EU/dl    Nitrites Negative NEGATIVE,Negative      Leukocyte Esterase Negative NEGATIVE,Negative      Color Amber      Appearance Cloudy     COVID-19 (INPATIENT TESTING)    Collection Time: 05/07/19  7:00 PM    Specimen: NASOPHARYNGEAL SWAB   Result Value Ref Range    COVID-19 NEGATIVE NEGATIVE  IMAGING RESULTS:    Xr Chest Sngl V    Result Date: 05/07/2019  EXAM:  Chest AP INDICATIONS: SOB, hypoxia COMPARISON: Most recently 03/13/2019. FINDINGS: There is an unchanged density in the left midlung zone just cranial to the known left lower lobe nodule. This was shown to be atelectasis on previous CT. The nodule is also unchanged in size. No new abnormalities are prominent bullous disease at the left lung apex. Normal cardiomediastinal contours.     IMPRESSION: 1.  No acute cardiopulmonary process. 2.  Stable atelectasis and nodule in the left midlung zone. 3.  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     Ct Abd Pelv W Cont    Result Date: 05/07/2019  DICOM format image data is available to non-affiliated external healthcare facilities or entities on a secure, media free, reciprocally searchable basis with patient authorization for 12 months following the date of the study. Clinical history: Left lower quadrant pain, elevated blood blood cells EXAMINATION: CT scan of the abdomen and pelvis with intravenous contrast 05/07/2019. 5 mm  spiral scanning is performed from the costophrenic angles to the symphysis pubis. Coronal and sagittal reconstruction imaging has been obtained. Correlation: 05/11/2018 and 02/22/2018 FINDINGS: There is respiratory motion artifact. There are bullous changes lung bases. Osteopenia and degenerative changes of the spine. Right hip prosthesis. Liver, gallbladder, spleen, adrenal glands are unremarkable. 1.6 and 1.1 cm splenic hypodensities unchanged from prior study. 2 mm nonobstructing right nephrolithiasis. Left kidney and bladder are unremarkable. Uterus is absent. There are colonic diverticuli. Terminal ileum, appendix, stomach and small bowel loops are within normal limits. Atherosclerotic calcification of the abdominal aorta. No dominant lymph node enlargement or free fluid.     IMPRESSION: 1. 2 mm nonobstructing right nephrolithiasis. 2. Small stable right renal cyst. 3. Diverticulosis. 4. Hysterectomy. 5. 1.6 and 1.1 cm stable splenic lesions.         Care Plan discussed with:     _0 Patient   _1 Family    _2 ED Care Manager  _3 ED Doc   _4 Specialist :    Total care time spent on reviewing the case/data/notes/EMR,examining the patient,documentation,coordinating care with nurses and consultants is 40 minutes.       ___________________________________________________  Admitting Physician: Charlaine Dalton, MD     Dragon medical dictation software was used for portions of this report.  Unintended voice transcription errors may have occurred.

## 2019-05-07 NOTE — ED Notes (Signed)
Blanket provided. Seen patient leaning against the bed rail moaning c/o abdominal pain.

## 2019-05-08 LAB — CBC WITH AUTO DIFFERENTIAL
Basophils %: 0.1 % (ref 0–3)
Eosinophils %: 0 % (ref 0–5)
Hematocrit: 46.4 % (ref 37.0–50.0)
Hemoglobin: 15.1 gm/dl (ref 13.0–17.2)
Immature Granulocytes: 0.7 % (ref 0.0–3.0)
Lymphocytes %: 4.6 % — ABNORMAL LOW (ref 28–48)
MCH: 29 pg (ref 25.4–34.6)
MCHC: 32.5 gm/dl (ref 30.0–36.0)
MCV: 89.2 fL (ref 80.0–98.0)
MPV: 11 fL — ABNORMAL HIGH (ref 6.0–10.0)
Monocytes %: 1.1 % (ref 1–13)
Neutrophils %: 93.5 % — ABNORMAL HIGH (ref 34–64)
Nucleated RBCs: 0 (ref 0–0)
Platelets: 331 10*3/uL (ref 140–450)
RBC: 5.2 M/uL (ref 3.60–5.20)
RDW-SD: 43.8 (ref 36.4–46.3)
WBC: 21.1 10*3/uL — ABNORMAL HIGH (ref 4.0–11.0)

## 2019-05-08 LAB — EKG 12-LEAD
Atrial Rate: 131 {beats}/min
Atrial Rate: 115 {beats}/min
P Axis: 66 degrees
P Axis: 87 degrees
P-R Interval: 144 ms
P-R Interval: 116 ms
Q-T Interval: 266 ms
Q-T Interval: 340 ms
QRS Duration: 66 ms
QRS Duration: 96 ms
QTc Calculation (Bazett): 394 ms
QTc Calculation (Bazett): 470 ms
R Axis: 53 degrees
R Axis: 50 degrees
T Axis: 120 degrees
T Axis: 57 degrees
Ventricular Rate: 132 {beats}/min
Ventricular Rate: 115 {beats}/min

## 2019-05-08 LAB — TROPONIN
Troponin I: <0.015 ng/ml (ref 0.000–0.045)
Troponin I: <0.015 ng/ml (ref 0.000–0.045)
Troponin I: <0.015 ng/ml (ref 0.000–0.045)

## 2019-05-08 LAB — BASIC METABOLIC PANEL
Anion Gap: 12 mmol/L (ref 5–15)
BUN: 22 mg/dl (ref 7–25)
CO2: 23 mEq/L (ref 21–32)
Calcium: 10 mg/dl (ref 8.5–10.1)
Chloride: 103 mEq/L (ref 98–107)
Creatinine: 1.1 mg/dl (ref 0.6–1.3)
EGFR IF NonAfrican American: 53
GFR African American: >60
Glucose: 191 mg/dl — ABNORMAL HIGH (ref 74–106)
Potassium: 5 mEq/L (ref 3.5–5.1)
Sodium: 138 mEq/L (ref 136–145)

## 2019-05-08 LAB — LACTIC ACID
LACTIC ACID: 1.2 mmol/L (ref 0.4–2.0)
LACTIC ACID: 2.2 mmol/L — ABNORMAL HIGH (ref 0.4–2.0)
Lactic Acid: 1.2 mmol/L (ref 0.4–2.0)
Lactic Acid: 2.2 mmol/L — ABNORMAL HIGH (ref 0.4–2.0)

## 2019-05-08 LAB — PROCALCITONIN
PROCALCITONIN: <0.05 ng/ml (ref 0.00–0.50)
PROCALCITONIN: <0.05 ng/ml (ref 0.00–0.50)
PROCALCITONIN: <0.05 ng/ml (ref 0.00–0.50)
PROCALCITONIN: <0.05 ng/ml (ref 0.00–0.50)

## 2019-05-08 LAB — POCT GLUCOSE
POC Glucose: 189 mg/dL — ABNORMAL HIGH (ref 65–105)
POC Glucose: 168 mg/dL — ABNORMAL HIGH (ref 65–105)
POC Glucose: 171 mg/dL — ABNORMAL HIGH (ref 65–105)
POC Glucose: 171 mg/dL — ABNORMAL HIGH (ref 65–105)
POC Glucose: 171 mg/dL — ABNORMAL HIGH (ref 65–105)

## 2019-05-08 LAB — COVID-19: COVID-19: NEGATIVE

## 2019-05-08 LAB — EKG, 12 LEAD, INITIAL
Atrial Rate: 131 {beats}/min
Atrial Rate: 115 {beats}/min
Calculated P Axis: 66 degrees
Calculated P Axis: 87 degrees
Calculated R Axis: 53 degrees
Calculated R Axis: 50 degrees
Calculated T Axis: 120 degrees
Calculated T Axis: 57 degrees
P-R Interval: 144 ms
P-R Interval: 116 ms
Q-T Interval: 266 ms
Q-T Interval: 340 ms
QRS Duration: 66 ms
QRS Duration: 96 ms
QTC Calculation (Bezet): 394 ms
QTC Calculation (Bezet): 470 ms
Ventricular Rate: 132 {beats}/min
Ventricular Rate: 115 {beats}/min

## 2019-05-08 LAB — METABOLIC PANEL, BASIC
Anion gap: 12 mmol/L (ref 5–15)
BUN: 22 mg/dl (ref 7–25)
CO2: 23 mEq/L (ref 21–32)
Calcium: 10 mg/dl (ref 8.5–10.1)
Chloride: 103 mEq/L (ref 98–107)
Creatinine: 1.1 mg/dl (ref 0.6–1.3)
GFR est AA: >60
GFR est non-AA: 53
Glucose: 191 mg/dl — ABNORMAL HIGH (ref 74–106)
Potassium: 5 mEq/L (ref 3.5–5.1)
Sodium: 138 mEq/L (ref 136–145)

## 2019-05-08 LAB — CBC WITH AUTOMATED DIFF
BASOPHILS: 0.1 % (ref 0–3)
EOSINOPHILS: 0 % (ref 0–5)
HCT: 46.4 % (ref 37.0–50.0)
HGB: 15.1 gm/dl (ref 13.0–17.2)
IMMATURE GRANULOCYTES: 0.7 % (ref 0.0–3.0)
LYMPHOCYTES: 4.6 % — ABNORMAL LOW (ref 28–48)
MCH: 29 pg (ref 25.4–34.6)
MCHC: 32.5 gm/dl (ref 30.0–36.0)
MCV: 89.2 fL (ref 80.0–98.0)
MONOCYTES: 1.1 % (ref 1–13)
MPV: 11 fL — ABNORMAL HIGH (ref 6.0–10.0)
NEUTROPHILS: 93.5 % — ABNORMAL HIGH (ref 34–64)
NRBC: 0 (ref 0–0)
PLATELET: 331 10*3/uL (ref 140–450)
RBC: 5.2 M/uL (ref 3.60–5.20)
RDW-SD: 43.8 (ref 36.4–46.3)
WBC: 21.1 10*3/uL — ABNORMAL HIGH (ref 4.0–11.0)

## 2019-05-08 LAB — GLUCOSE, POC
Glucose (POC): 189 mg/dL — ABNORMAL HIGH (ref 65–105)
Glucose (POC): 168 mg/dL — ABNORMAL HIGH (ref 65–105)
Glucose (POC): 171 mg/dL — ABNORMAL HIGH (ref 65–105)
Glucose (POC): 171 mg/dL — ABNORMAL HIGH (ref 65–105)
Glucose (POC): 171 mg/dL — ABNORMAL HIGH (ref 65–105)

## 2019-05-08 LAB — COVID-19 (INPATIENT TESTING): COVID-19: NEGATIVE

## 2019-05-08 LAB — TROPONIN I
Troponin-I: <0.015 ng/ml (ref 0.000–0.045)
Troponin-I: <0.015 ng/ml (ref 0.000–0.045)
Troponin-I: <0.015 ng/ml (ref 0.000–0.045)

## 2019-05-08 MED ORDER — CLONIDINE 0.1 MG TAB
0.1 mg | Freq: Two times a day (BID) | ORAL | Status: DC
Start: 2019-05-08 — End: 2019-05-13
  Administered 2019-05-08 – 2019-05-13 (×11): via ORAL

## 2019-05-08 MED ORDER — HYDRALAZINE 20 MG/ML IJ SOLN
20 mg/mL | Freq: Four times a day (QID) | INTRAMUSCULAR | Status: DC | PRN
Start: 2019-05-08 — End: 2019-05-13
  Administered 2019-05-08 (×3): via INTRAVENOUS

## 2019-05-08 MED ORDER — ALBUTEROL SULFATE 0.083 % (0.83 MG/ML) SOLN FOR INHALATION
2.5 mg /3 mL (0.083 %) | Freq: Four times a day (QID) | RESPIRATORY_TRACT | Status: DC | PRN
Start: 2019-05-08 — End: 2019-05-13

## 2019-05-08 MED ORDER — ACETAMINOPHEN 325 MG TABLET
325 mg | Freq: Four times a day (QID) | ORAL | Status: DC | PRN
Start: 2019-05-08 — End: 2019-05-13
  Administered 2019-05-08: 11:00:00 via ORAL

## 2019-05-08 MED ORDER — SODIUM CHLORIDE 0.9 % INJECTION
40 mg | Freq: Every day | INTRAMUSCULAR | Status: DC
Start: 2019-05-08 — End: 2019-05-13
  Administered 2019-05-09 – 2019-05-13 (×5): via INTRAVENOUS

## 2019-05-08 MED ORDER — MORPHINE 2 MG/ML INJECTION
2 mg/mL | INTRAMUSCULAR | Status: DC | PRN
Start: 2019-05-08 — End: 2019-05-13
  Administered 2019-05-08 – 2019-05-13 (×23): via INTRAVENOUS

## 2019-05-08 MED ORDER — FLUTICASONE 100 MCG-VILANTEROL 25 MCG/DOSE BREATH ACTIVATED INHALER
100-25 mcg/dose | Freq: Every day | RESPIRATORY_TRACT | Status: DC
Start: 2019-05-08 — End: 2019-05-13
  Administered 2019-05-08 – 2019-05-13 (×6): via RESPIRATORY_TRACT

## 2019-05-08 MED ORDER — LOSARTAN 50 MG TAB
50 mg | Freq: Every day | ORAL | Status: DC
Start: 2019-05-08 — End: 2019-05-13
  Administered 2019-05-08 – 2019-05-09 (×2): via ORAL

## 2019-05-08 MED ORDER — TIOTROPIUM BROMIDE 2.5 MCG/ACTUATION MIST FOR INHALATION
2.5 mcg/actuation | Freq: Every day | RESPIRATORY_TRACT | Status: DC
Start: 2019-05-08 — End: 2019-05-13
  Administered 2019-05-08 – 2019-05-13 (×6): via RESPIRATORY_TRACT

## 2019-05-08 MED ORDER — ALUM-MAG HYDROXIDE-SIMETH 200 MG-200 MG-20 MG/5 ML ORAL SUSP
200-200-20 mg/5 mL | ORAL | Status: DC | PRN
Start: 2019-05-08 — End: 2019-05-13

## 2019-05-08 MED ORDER — SODIUM CHLORIDE 0.9 % IV
INTRAVENOUS | Status: DC
Start: 2019-05-08 — End: 2019-05-09
  Administered 2019-05-08: via INTRAVENOUS

## 2019-05-08 MED ORDER — SODIUM CHLORIDE 0.9 % IJ SYRG
INTRAMUSCULAR | Status: DC | PRN
Start: 2019-05-08 — End: 2019-05-13

## 2019-05-08 MED ORDER — INSULIN LISPRO 100 UNIT/ML INJECTION
100 unit/mL | Freq: Four times a day (QID) | SUBCUTANEOUS | Status: DC
Start: 2019-05-08 — End: 2019-05-13
  Administered 2019-05-08 – 2019-05-13 (×18): via SUBCUTANEOUS

## 2019-05-08 MED ORDER — HYDROCODONE-ACETAMINOPHEN 5 MG-325 MG TAB
5-325 mg | ORAL | Status: DC | PRN
Start: 2019-05-08 — End: 2019-05-13
  Administered 2019-05-09 – 2019-05-13 (×13): via ORAL

## 2019-05-08 MED ORDER — NALOXONE 0.4 MG/ML INJECTION
0.4 mg/mL | INTRAMUSCULAR | Status: DC | PRN
Start: 2019-05-08 — End: 2019-05-13

## 2019-05-08 MED ORDER — MONTELUKAST 10 MG TAB
10 mg | Freq: Every evening | ORAL | Status: DC
Start: 2019-05-08 — End: 2019-05-13
  Administered 2019-05-08 – 2019-05-13 (×6): via ORAL

## 2019-05-08 MED ORDER — DEXTROSE 50% IN WATER (D50W) IV
INTRAVENOUS | Status: DC | PRN
Start: 2019-05-08 — End: 2019-05-13

## 2019-05-08 MED ORDER — HEPARIN (PORCINE) 5,000 UNIT/ML IJ SOLN
5000 unit/mL | Freq: Three times a day (TID) | INTRAMUSCULAR | Status: DC
Start: 2019-05-08 — End: 2019-05-13
  Administered 2019-05-08 – 2019-05-13 (×23): via SUBCUTANEOUS

## 2019-05-08 MED ORDER — METHYLPREDNISOLONE (PF) 40 MG/ML IJ SOLR
40 mg/mL | Freq: Every day | INTRAMUSCULAR | Status: DC
Start: 2019-05-08 — End: 2019-05-08

## 2019-05-08 MED ORDER — HYDROCODONE-ACETAMINOPHEN 5 MG-325 MG TAB
5-325 mg | ORAL | Status: DC | PRN
Start: 2019-05-08 — End: 2019-05-08
  Administered 2019-05-08 (×4): via ORAL

## 2019-05-08 MED ORDER — METHYLPREDNISOLONE (PF) 40 MG/ML IJ SOLR
40 mg/mL | Freq: Three times a day (TID) | INTRAMUSCULAR | Status: DC
Start: 2019-05-08 — End: 2019-05-08
  Administered 2019-05-08 (×2): via INTRAVENOUS

## 2019-05-08 MED ORDER — GABAPENTIN 300 MG CAP
300 mg | Freq: Three times a day (TID) | ORAL | Status: DC
Start: 2019-05-08 — End: 2019-05-13
  Administered 2019-05-08 – 2019-05-13 (×17): via ORAL

## 2019-05-08 MED ORDER — TRAZODONE 50 MG TAB
50 mg | Freq: Every evening | ORAL | Status: DC | PRN
Start: 2019-05-08 — End: 2019-05-13
  Administered 2019-05-08 – 2019-05-10 (×3): via ORAL

## 2019-05-08 MED ORDER — INSULIN LISPRO 100 UNIT/ML INJECTION
100 unit/mL | SUBCUTANEOUS | Status: DC | PRN
Start: 2019-05-08 — End: 2019-05-13
  Administered 2019-05-12: 03:00:00 via SUBCUTANEOUS

## 2019-05-08 MED ORDER — METHOCARBAMOL 500 MG TAB
500 mg | Freq: Four times a day (QID) | ORAL | Status: DC
Start: 2019-05-08 — End: 2019-05-13
  Administered 2019-05-08 – 2019-05-13 (×22): via ORAL

## 2019-05-08 MED ORDER — SODIUM CHLORIDE 0.9 % IJ SYRG
Freq: Three times a day (TID) | INTRAMUSCULAR | Status: DC
Start: 2019-05-08 — End: 2019-05-13
  Administered 2019-05-08 – 2019-05-13 (×16): via INTRAVENOUS

## 2019-05-08 MED ORDER — GLUCAGON 1 MG INJECTION
1 mg | INTRAMUSCULAR | Status: DC | PRN
Start: 2019-05-08 — End: 2019-05-13

## 2019-05-08 MED ORDER — GUAIFENESIN 600 MG TABLET,EXTENDED RELEASE BIPHASIC 12 HR
600 mg | Freq: Two times a day (BID) | ORAL | Status: DC
Start: 2019-05-08 — End: 2019-05-13
  Administered 2019-05-08 – 2019-05-13 (×12): via ORAL

## 2019-05-08 MED ORDER — ONDANSETRON (PF) 4 MG/2 ML INJECTION
4 mg/2 mL | INTRAMUSCULAR | Status: DC | PRN
Start: 2019-05-08 — End: 2019-05-13

## 2019-05-08 MED ORDER — CETIRIZINE 5 MG TAB
5 mg | Freq: Every day | ORAL | Status: DC
Start: 2019-05-08 — End: 2019-05-13
  Administered 2019-05-08 – 2019-05-13 (×6): via ORAL

## 2019-05-08 MED ORDER — INSULIN GLARGINE 100 UNIT/ML INJECTION
100 unit/mL | Freq: Every evening | SUBCUTANEOUS | Status: DC
Start: 2019-05-08 — End: 2019-05-13
  Administered 2019-05-08 – 2019-05-13 (×6): via SUBCUTANEOUS

## 2019-05-08 MED ORDER — ROSUVASTATIN 20 MG TAB
20 mg | Freq: Every evening | ORAL | Status: DC
Start: 2019-05-08 — End: 2019-05-13
  Administered 2019-05-08 – 2019-05-13 (×6): via ORAL

## 2019-05-08 MED ORDER — DILTIAZEM ER 240 MG 24 HR CAP
240 mg | Freq: Every day | ORAL | Status: DC
Start: 2019-05-08 — End: 2019-05-12
  Administered 2019-05-08 – 2019-05-12 (×5): via ORAL

## 2019-05-08 MED ORDER — MORPHINE 2 MG/ML INJECTION
2 mg/mL | INTRAMUSCULAR | Status: AC
Start: 2019-05-08 — End: 2019-05-08
  Administered 2019-05-08: 12:00:00 via INTRAVENOUS

## 2019-05-08 MED ORDER — SPIRONOLACTONE 25 MG TAB
25 mg | Freq: Every day | ORAL | Status: DC
Start: 2019-05-08 — End: 2019-05-13
  Administered 2019-05-08 – 2019-05-13 (×6): via ORAL

## 2019-05-08 MED FILL — HYDRALAZINE 20 MG/ML IJ SOLN: 20 mg/mL | INTRAMUSCULAR | Qty: 1

## 2019-05-08 MED FILL — MUCINEX 600 MG TABLET, EXTENDED RELEASE: 600 mg | ORAL | Qty: 1

## 2019-05-08 MED FILL — HEPARIN (PORCINE) 5,000 UNIT/ML IJ SOLN: 5000 unit/mL | INTRAMUSCULAR | Qty: 1

## 2019-05-08 MED FILL — MORPHINE 2 MG/ML INJECTION: 2 mg/mL | INTRAMUSCULAR | Qty: 1

## 2019-05-08 MED FILL — SPIRONOLACTONE 25 MG TAB: 25 mg | ORAL | Qty: 4

## 2019-05-08 MED FILL — CLONIDINE 0.1 MG TAB: 0.1 mg | ORAL | Qty: 1

## 2019-05-08 MED FILL — SODIUM CHLORIDE 0.9 % IJ SYRG: INTRAMUSCULAR | Qty: 10

## 2019-05-08 MED FILL — METHOCARBAMOL 500 MG TAB: 500 mg | ORAL | Qty: 1

## 2019-05-08 MED FILL — CETIRIZINE 5 MG TAB: 5 mg | ORAL | Qty: 2

## 2019-05-08 MED FILL — SODIUM CHLORIDE 0.9 % IV: INTRAVENOUS | Qty: 1000

## 2019-05-08 MED FILL — HYDROCODONE-ACETAMINOPHEN 5 MG-325 MG TAB: 5-325 mg | ORAL | Qty: 1

## 2019-05-08 MED FILL — LOSARTAN 50 MG TAB: 50 mg | ORAL | Qty: 1

## 2019-05-08 MED FILL — SPIRIVA RESPIMAT 2.5 MCG/ACTUATION SOLUTION FOR INHALATION: 2.5 mcg/actuation | RESPIRATORY_TRACT | Qty: 4

## 2019-05-08 MED FILL — GABAPENTIN 300 MG CAP: 300 mg | ORAL | Qty: 1

## 2019-05-08 MED FILL — TRAZODONE 50 MG TAB: 50 mg | ORAL | Qty: 1

## 2019-05-08 MED FILL — TYLENOL 325 MG TABLET: 325 mg | ORAL | Qty: 2

## 2019-05-08 MED FILL — MONTELUKAST 10 MG TAB: 10 mg | ORAL | Qty: 1

## 2019-05-08 MED FILL — ROSUVASTATIN 20 MG TAB: 20 mg | ORAL | Qty: 1

## 2019-05-08 MED FILL — BREO ELLIPTA 100 MCG-25 MCG/DOSE POWDER FOR INHALATION: 100-25 mcg/dose | RESPIRATORY_TRACT | Qty: 28

## 2019-05-08 MED FILL — SOLU-MEDROL (PF) 40 MG/ML SOLUTION FOR INJECTION: 40 mg/mL | INTRAMUSCULAR | Qty: 1

## 2019-05-08 MED FILL — DILTIAZEM ER 240 MG 24 HR CAP: 240 mg | ORAL | Qty: 1

## 2019-05-08 NOTE — Progress Notes (Signed)
CM attempted to complete dcp, but the patient was having pain.  CM will follow up.

## 2019-05-08 NOTE — Progress Notes (Addendum)
0545 RN rounded on patient. Patient was in distress. Labored breathing but lungs sound clear/dimished, patient has non-productive cough, stating she feels short of beathe. Patient states, " I have pain all over, I have chest pain, I feel like I'm going to die." RN called for Help. RN took patient initial VS BP 202/131, HR 130, T 100.8 orally, O2 stats 94% on 2L. Blood sugar 168. RN titrated O2 until patient stats increased to 98-100%. Nursing Staff arrived with EKG and emergent equipment.  RRT staff came to bedside. Verbal orders to give 1 mg of ordered prn morphine, 650mg ordered prn tylenol, and 10 mg of IV Hydralazine early. Hospitalist paged and informed of events.    0600  Hospitalist on call, MD Matriano states to give patient an additional dose of 2 mg of morphine after 30 minutes is patient continues to complain of pain. MD continues to say if 30 minutes after 2 mg or morphine administration patient still has pain then to contact hospitalist.    0620  RN Will continue to monitor. Repeat VS:  BP 139/113  O2 96 2L  HR 125  T 98.0 axiallary

## 2019-05-08 NOTE — Progress Notes (Signed)
0411  PAGER ID: [phone]  MESSAGE: [id number]- M. Lawhead 68 yr female. Dx: CHF exacerbation. BP 181/132 rechecked. Thanks [phone]      0419  MD states to give 0900 clonidine dose now.

## 2019-05-08 NOTE — Progress Notes (Signed)
INTERNAL MEDICINE PROGRESS NOTE    Date of note:      May 08, 2019  Patient:               [name] [name], 68 y.o., female  Admit Date:        05/07/2019  Length of Stay:  1 day(s)    Overnight/24-hour Events:    Reviewed.  Subjective:     abdominal pain more on left side  Assessment:           1. Acute hypoxic respiratory failure - presumed COPD exacerbation  2. Abdominal pain - ddx: gastroenteritis, ?colitis, ?ischemic etiology, CT A/P with contrast: 2 mm nonobstructing right nephrolithiasis.  Small stable right renal cyst.  Diverticulosis.  Hysterectomy. 1.6 and 1.1 cm stable splenic lesion with no acute pathology noted  3. Hypertension  4. Type 2 DM  5. Hyperlipidemia  6. Peripheral neuropathy  7. Left lower lobe nodule  8. History of tobacco abuse  9. Pulmonary nodule  10. 2 mm nonobstructing right nephrolithiasis  Plan:           ? Continue breo, albuterol prn  ? Stop solumedrol per recommendations by Pulm  ? Titrate oxygen to maintain saturation above 90%  ? Continue singulair, cardizem, trazodone spiriva  ? glucommander protocol  ? Pulmonary on board appreciate recommendations  ? GI consulted for abdominal pain  ? Pain control, antiemetics  ? Stool studies pending  ? If pain has not improved may consider repeat CT scan tomorrow am   ? Start IV fluids    Diet: diabetic  DVT ppx: heparin  Total clinical care time is 35 minutes, including review of chart including all labs, radiology, past medical history, and discussion with patient and family. Greater than 50% of my time was spent in coordination of care and counseling     Disposition:               hospitalization  Objective:   BP (!) 143/112 (BP 1 Location: Right arm) Comment: recheck after interventions   Pulse (!) 121    Temp 98 ??F (36.7 ??C)    Resp 20    Ht 5' 8"  (1.727 m)    Wt 93.2 kg (205 lb 7.5 oz)    SpO2 100%    Breastfeeding No    BMI 31.24 kg/m??     General: alert oriented in mild distress from pain  HEENT: normocephalic, atraumatic   Lungs: clear to auscultation  Heart: regular rate and rhythm  Abdomen: LLQ tenderness  Extremities: no edema  Skin: texture turgor normal  Neuro: no FNDs  Current Medications:     Current Facility-Administered Medications:   ???  cetirizine (ZYRTEC) tablet 10 mg, 10 mg, Oral, DAILY, Charlaine Dalton, MD  ???  cloNIDine HCL (CATAPRES) tablet 0.3 mg, 0.3 mg, Oral, BID, Charlaine Dalton, MD, 0.3 mg at 05/08/19 0458  ???  dilTIAZem ER (CARDIZEM CD) capsule 240 mg, 240 mg, Oral, DAILY, Charlaine Dalton, MD  ???  fluticasone-vilanterol (BREO ELLIPTA) 151mg-25mcg/puff, 1 Puff, Inhalation, DAILY, VCharlaine Dalton MD  ???  gabapentin (NEURONTIN) capsule 300 mg, 300 mg, Oral, TID, VCharlaine Dalton MD, 300 mg at 05/07/19 2243  ???  guaiFENesin ER (MUCINEX) tablet 600 mg, 600 mg, Oral, Q12H, VCharlaine Dalton MD, 600 mg at 05/07/19 2243  ???  losartan (COZAAR) tablet 50 mg, 50 mg, Oral, DAILY, VCharlaine Dalton MD  ???  methocarbamoL (ROBAXIN) tablet 500 mg, 500 mg, Oral, QID, VCharlaine Dalton MD, 500 mg at  05/07/19 2243  ???  montelukast (SINGULAIR) tablet 10 mg, 10 mg, Oral, QHS, Charlaine Dalton, MD, 10 mg at 05/07/19 2243  ???  rosuvastatin (CRESTOR) tablet 20 mg, 20 mg, Oral, QHS, Charlaine Dalton, MD, 20 mg at 05/07/19 2242  ???  spironolactone (ALDACTONE) tablet 100 mg, 100 mg, Oral, DAILY, Charlaine Dalton, MD  ???  tiotropium bromide (SPIRIVA RESPIMAT) 2.5 mcg /actuation, 1 Tancred, Inhalation, DAILY, Charlaine Dalton, MD  ???  traZODone (DESYREL) tablet 50 mg, 50 mg, Oral, QHS PRN, Charlaine Dalton, MD, 50 mg at 05/08/19 0008  ???  sodium chloride (NS) flush 5-10 mL, 5-10 mL, IntraVENous, Q8H, Charlaine Dalton, MD, 10 mL at 05/08/19 1914  ???  sodium chloride (NS) flush 5-10 mL, 5-10 mL, IntraVENous, PRN, Charlaine Dalton, MD  ???  naloxone (NARCAN) injection 0.1 mg, 0.1 mg, IntraVENous, PRN, Charlaine Dalton, MD  ???  acetaminophen (TYLENOL) tablet 650 mg, 650 mg, Oral, Q6H PRN, Charlaine Dalton, MD, 650 mg at 05/08/19 0601   ???  HYDROcodone-acetaminophen (NORCO) 5-325 mg per tablet 1 Tab, 1 Tab, Oral, Q4H PRN, Charlaine Dalton, MD, 1 Tab at 05/08/19 0405  ???  morphine injection 1 mg, 1 mg, IntraVENous, Q4H PRN, Charlaine Dalton, MD, 1 mg at 05/08/19 0602  ???  alum-mag hydroxide-simeth (MYLANTA) oral suspension 30 mL, 30 mL, Oral, Q4H PRN, Charlaine Dalton, MD  ???  albuterol (PROVENTIL VENTOLIN) nebulizer solution 2.5 mg, 2.5 mg, Nebulization, Q6H PRN, Charlaine Dalton, MD  ???  heparin (porcine) injection 5,000 Units, 5,000 Units, SubCUTAneous, Q8H, Charlaine Dalton, MD, 5,000 Units at 05/08/19 [phone]  ???  methylPREDNISolone (PF) (SOLU-MEDROL) injection 40 mg, 40 mg, IntraVENous, Q8H, Charlaine Dalton, MD, 40 mg at 05/08/19 0326  ???  hydrALAZINE (APRESOLINE) 20 mg/mL injection 10 mg, 10 mg, IntraVENous, Q6H PRN, Charlaine Dalton, MD, 10 mg at 05/08/19 0600  ???  dextrose (D50) infusion 5-25 g, 10-50 mL, IntraVENous, PRN, Charlaine Dalton, MD  ???  glucagon (GLUCAGEN) injection 1 mg, 1 mg, IntraMUSCular, PRN, Charlaine Dalton, MD  ???  insulin glargine (LANTUS) injection 1-100 Units, 1-100 Units, SubCUTAneous, QHS, Charlaine Dalton, MD, 14 Units at 05/07/19 2244  ???  insulin lispro (HUMALOG) injection 1-100 Units, 1-100 Units, SubCUTAneous, AC&HS, Charlaine Dalton, MD, 1 Units at 05/07/19 2243  ???  insulin lispro (HUMALOG) injection 1-100 Units, 1-100 Units, SubCUTAneous, PRN, Charlaine Dalton, MD  Labs:     Recent Results (from the past 24 hour(s))   CBC WITH AUTOMATED DIFF    Collection Time: 05/07/19  1:55 PM   Result Value Ref Range    WBC 21.1 (H) 4.0 - 11.0 1000/mm3    RBC 4.91 3.60 - 5.20 M/uL    HGB 14.8 13.0 - 17.2 gm/dl    HCT 43.7 37.0 - 50.0 %    MCV 89.0 80.0 - 98.0 fL    MCH 30.1 25.4 - 34.6 pg    MCHC 33.9 30.0 - 36.0 gm/dl    PLATELET 303 140 - 450 1000/mm3    MPV 11.0 (H) 6.0 - 10.0 fL    RDW-SD 43.6 36.4 - 46.3      NRBC 0 0 - 0      IMMATURE GRANULOCYTES 0.4 0.0 - 3.0 %    NEUTROPHILS 76.9 (H) 34 - 64 %    LYMPHOCYTES 17.0 (L) 28 - 48 %     MONOCYTES 4.9 1 - 13 %    EOSINOPHILS 0.5 0 - 5 %    BASOPHILS 0.3 0 - 3 %   METABOLIC PANEL,  COMPREHENSIVE    Collection Time: 05/07/19  1:55 PM   Result Value Ref Range    Sodium 136 136 - 145 mEq/L    Potassium 4.3 3.5 - 5.1 mEq/L    Chloride 105 98 - 107 mEq/L    CO2 26 21 - 32 mEq/L    Glucose 159 (H) 74 - 106 mg/dl    BUN 24 7 - 25 mg/dl    Creatinine 1.2 0.6 - 1.3 mg/dl    GFR est AA 58.0      GFR est non-AA 48      Calcium 9.8 8.5 - 10.1 mg/dl    AST (SGOT) 7 (L) 15 - 37 U/L    ALT (SGPT) 16 12 - 78 U/L    Alk. phosphatase 108 45 - 117 U/L    Bilirubin, total 0.5 0.2 - 1.0 mg/dl    Protein, total 9.2 (H) 6.4 - 8.2 gm/dl    Albumin 4.2 3.4 - 5.0 gm/dl    Anion gap 6 5 - 15 mmol/L   LIPASE    Collection Time: 05/07/19  1:55 PM   Result Value Ref Range    Lipase 90 73 - 393 U/L   PROCALCITONIN    Collection Time: 05/07/19  1:55 PM   Result Value Ref Range    PROCALCITONIN <0.05 0.00 - 0.50 ng/ml   POC URINE MACROSCOPIC    Collection Time: 05/07/19  3:46 PM   Result Value Ref Range    Glucose Negative NEGATIVE,Negative mg/dl    Bilirubin Small (A) NEGATIVE,Negative      Ketone Negative NEGATIVE,Negative mg/dl    Specific gravity >=1.030 1.005 - 1.030      Blood Moderate (A) NEGATIVE,Negative      pH (UA) 5.0 5 - 9      Protein >=300 (A) NEGATIVE,Negative mg/dl    Urobilinogen 0.2 0.0 - 1.0 EU/dl    Nitrites Negative NEGATIVE,Negative      Leukocyte Esterase Negative NEGATIVE,Negative      Color Amber      Appearance Cloudy     COVID-19 (INPATIENT TESTING)    Collection Time: 05/07/19  7:00 PM    Specimen: NASOPHARYNGEAL SWAB   Result Value Ref Range    COVID-19 NEGATIVE NEGATIVE     LACTIC ACID    Collection Time: 05/07/19  8:48 PM   Result Value Ref Range    Lactic Acid 1.2 0.4 - 2.0 mmol/L   TROPONIN I    Collection Time: 05/07/19  8:48 PM   Result Value Ref Range    Troponin-I <0.015 0.000 - 0.045 ng/ml   GLUCOSE, POC    Collection Time: 05/07/19  9:57 PM   Result Value Ref Range     Glucose (POC) 189 (H) 65 - 105 mg/dL   CBC WITH AUTOMATED DIFF    Collection Time: 05/08/19 12:00 AM   Result Value Ref Range    WBC 21.1 (H) 4.0 - 11.0 1000/mm3    RBC 5.20 3.60 - 5.20 M/uL    HGB 15.1 13.0 - 17.2 gm/dl    HCT 46.4 37.0 - 50.0 %    MCV 89.2 80.0 - 98.0 fL    MCH 29.0 25.4 - 34.6 pg    MCHC 32.5 30.0 - 36.0 gm/dl    PLATELET 331 140 - 450 1000/mm3    MPV 11.0 (H) 6.0 - 10.0 fL    RDW-SD 43.8 36.4 - 46.3      NRBC 0 0 - 0  IMMATURE GRANULOCYTES 0.7 0.0 - 3.0 %    NEUTROPHILS 93.5 (H) 34 - 64 %    LYMPHOCYTES 4.6 (L) 28 - 48 %    MONOCYTES 1.1 1 - 13 %    EOSINOPHILS 0.0 0 - 5 %    BASOPHILS 0.1 0 - 3 %   METABOLIC PANEL, BASIC    Collection Time: 05/08/19 12:00 AM   Result Value Ref Range    Sodium 138 136 - 145 mEq/L    Potassium 5.0 3.5 - 5.1 mEq/L    Chloride 103 98 - 107 mEq/L    CO2 23 21 - 32 mEq/L    Glucose 191 (H) 74 - 106 mg/dl    BUN 22 7 - 25 mg/dl    Creatinine 1.1 0.6 - 1.3 mg/dl    GFR est AA >60.0      GFR est non-AA 53      Calcium 10.0 8.5 - 10.1 mg/dl    Anion gap 12 5 - 15 mmol/L   TROPONIN I    Collection Time: 05/08/19 12:00 AM   Result Value Ref Range    Troponin-I <0.015 0.000 - 0.045 ng/ml   TROPONIN I    Collection Time: 05/08/19 12:42 AM   Result Value Ref Range    Troponin-I <0.015 0.000 - 0.045 ng/ml   GLUCOSE, POC    Collection Time: 05/08/19  5:49 AM   Result Value Ref Range    Glucose (POC) 168 (H) 65 - 105 mg/dL   LACTIC ACID    Collection Time: 05/08/19  6:51 AM   Result Value Ref Range    Lactic Acid 2.2 (H) 0.4 - 2.0 mmol/L   GLUCOSE, POC    Collection Time: 05/08/19  7:44 AM   Result Value Ref Range    Glucose (POC) 171 (H) 65 - 105 mg/dL     Radiology   Xr Chest Sngl V    Result Date: 05/07/2019  EXAM:  Chest AP INDICATIONS: SOB, hypoxia COMPARISON: Most recently 03/13/2019. FINDINGS: There is an unchanged density in the left midlung zone just cranial to the known left lower lobe nodule. This was shown to  be atelectasis on previous CT. The nodule is also unchanged in size. No new abnormalities are prominent bullous disease at the left lung apex. Normal cardiomediastinal contours.     IMPRESSION: 1.  No acute cardiopulmonary process. 2.  Stable atelectasis and nodule in the left midlung zone. 3.  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     Ct Abd Pelv W Cont    Result Date: 05/07/2019  DICOM format image data is available to non-affiliated external healthcare facilities or entities on a secure, media free, reciprocally searchable basis with patient authorization for 12 months following the date of the study. Clinical history: Left lower quadrant pain, elevated blood blood cells EXAMINATION: CT scan of the abdomen and pelvis with intravenous contrast 05/07/2019. 5 mm spiral scanning is performed from the costophrenic angles to the symphysis pubis. Coronal and sagittal reconstruction imaging has been obtained. Correlation: 05/11/2018 and 02/22/2018 FINDINGS: There is respiratory motion artifact. There are bullous changes lung bases. Osteopenia and degenerative changes of the spine. Right hip prosthesis. Liver, gallbladder, spleen, adrenal glands are unremarkable. 1.6 and 1.1 cm splenic hypodensities unchanged from prior study. 2 mm nonobstructing right nephrolithiasis. Left kidney and bladder are unremarkable. Uterus is absent. There are colonic diverticuli. Terminal ileum, appendix, stomach and small bowel loops are within normal limits. Atherosclerotic calcification of  the abdominal aorta. No dominant lymph node enlargement or free fluid.     IMPRESSION: 1. 2 mm nonobstructing right nephrolithiasis. 2. Small stable right renal cyst. 3. Diverticulosis. 4. Hysterectomy. 5. 1.6 and 1.1 cm stable splenic lesions.        Portions of this electronic record were dictated using Systems analyst. Unintended errors in translation may occur.      Holland Falling, MD  [facility]   May 08, 2019  7:50 AM

## 2019-05-08 NOTE — Consults (Signed)
Pulmonary Consultation: IV       IMPRESSION: ??   COPD without exacerbation: Maintained on Spiriva and Advair follows with Dr. Raynelle Jan  ?? Significant bullous emphysema: Unevenly distributed.  Bilateral large bullous disease  ?? Stable pulmonary nodule   ?? Abdominal pain of unclear etiology.  ?? Small 2 mm nonobstructing kidney stone.  ?? History of prior abdominal surgery with mesh.  ?? 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 GERD  ?? Obesity:Body mass index is 31.24 kg/m??.  ??    RECOMMENDATIONS:   Continue usual care and COPD treatment as outpatient.  I discussed the findings on the CT scans from before with the patient and her husband.  She may be candidate for bullectomies  in the future although she has chronic comorbid conditions and may not be the best surgical candidate.  She denies any exacerbation of her breathing at this time so I would not treat her with steroids.  Defer management of abdominal pains and other etiologies to the primary team.  We will sign off follow-up as outpatient with Dr. Leonia Reader  Call back as needed.      ??                Subjective:   [name] [name] is an 68 y.o. female with PMHx as detailed below including h/o COPD Who was admitted on 05/07/2019 for malaise and abdominal pains.  This has been ongoing.  She has some diarrhea as well.  She tested negative for Covid.  Per record she had some shortness of breath but the patient denied that.  Husband is here with her in the room and he denied any worsening of his breathing.  The patient is maintained on Spiriva and Advair.  Does not have oxygen at home.  Still able to walk but limited will use uses a walker to walk due to some hip pain and chronic pains otherwise..      Patient said she has been feeling nauseous, had some vomiting and loose bowel movement, developed progressive abdominal pain more on the left than the right                  Past Medical History:   Diagnosis Date   ??? Arthritis     ??? 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 DM2 (diabetes mellitus, type 2) ([place])    ??? GERD (gastroesophageal reflux disease)    ??? Hepatitis C     HEP C   ??? HTN (hypertension)    ??? Lung nodule, multiple     (CT 10/2016) Small left upper lobe and 1.7 x 1.6 cm left lower lobe nodules not significantly changed from 07/23/2016 and 03/22/2016   ??? Marijuana use    ??? PTSD (post-traumatic stress disorder)     lived through the [place] in 1993   ??? Thoracic ascending aortic aneurysm ([place])     4.4cm noted on CT Chest (10/2016)   ??? Thyroid nodule     2.5 cm stable right thyroid nodule (CT 10/2016)     Past Surgical History:   Procedure Laterality Date   ??? HX ENDOSCOPY     ??? HX HERNIA REPAIR  2019    x2   ??? HX HIP REPLACEMENT Right 01/29/2015   ??? HX  HIP REPLACEMENT     ??? HX HYSTERECTOMY  2010    hysterectomy     Allergies   Allergen Reactions   ??? Chocolate [Cocoa] Sneezing     Confirms allergy but reports "I still eat it"     No current facility-administered medications on file prior to encounter.      Current Outpatient Medications on File Prior to Encounter   Medication Sig Dispense Refill   ??? methocarbamoL (Robaxin) 500 mg tablet Take 1 Tab by mouth four (4) times daily. 20 Tab 0   ??? ibuprofen (MOTRIN) 600 mg tablet Take 1 Tab by mouth every six (6) hours as needed for Pain. 20 Tab 0   ??? butalbital-acetaminophen-caff (FIORICET) 50-300-40 mg per capsule Take 1 Cap by mouth every four (4) hours as needed for Headache. 30 Cap 0   ??? dilTIAZem CD (CARDIZEM CD) 240 mg ER capsule Take 1 Cap by mouth daily. 30 Cap 1   ??? losartan (COZAAR) 50 mg tablet Take 1 Tab by mouth daily. 30 Tab 1   ??? cetirizine (ZYRTEC) 10 mg tablet Take 1 Tab by mouth daily. 30 Tab 1   ??? fluticasone propionate (FLONASE) 50 mcg/actuation nasal spray 2 Sprays by Both Nostrils route daily. (Patient taking differently: 2 Sprays by  Both Nostrils route daily as needed.) 1 Bottle 1   ??? guaiFENesin ER (MUCINEX) 600 mg ER tablet Take 1 Tab by mouth every twelve (12) hours. 20 Tab 0   ??? LANTUS SOLOSTAR U-100 INSULIN 100 unit/mL (3 mL) inpn INJECT 32 UNITS SUBCUTANEOUSLY  NIGHTLY 15 mL 0   ??? albuterol (ACCUNEB) 1.25 mg/3 mL nebu USE 1 VIAL IN NEBULIZER EVERY 6 HOURS AS NEEDED FOR  SHORTNESS  OF  BREATH,  WHEEZING 270 mL 1   ??? cloNIDine HCl (CATAPRES) 0.1 mg tablet Take 3 Tabs by mouth two (2) times a day. 60 Tab 1   ??? spironolactone (ALDACTONE) 100 mg tablet TAKE 1 TABLET EVERY DAY 90 Tab 0   ??? metFORMIN ER (GLUCOPHAGE XR) 500 mg tablet TAKE 1 TABLET TWICE DAILY 180 Tab 1   ??? montelukast (SINGULAIR) 10 mg tablet TAKE 1 TABLET EVERY DAY 90 Tab 3   ??? gabapentin (NEURONTIN) 300 mg capsule Take 1 Cap by mouth three (3) times daily. Max Daily Amount: 900 mg. 270 Cap 1   ??? polyethylene glycol (MIRALAX) 17 gram packet Take 1 Packet by mouth daily. (Patient taking differently: Take 17 g by mouth daily as needed for Constipation.) 30 Packet 1   ??? magnesium oxide (MAG-OX) 400 mg tablet Take 400 mg by mouth daily.     ??? QUEtiapine SR (SEROQUEL XR) 150 mg sr tablet Take 150 mg by mouth nightly as needed.     ??? tiotropium bromide (SPIRIVA RESPIMAT) 2.5 mcg/actuation inhaler Take 1 Puff by inhalation daily. 1 Inhaler 0   ??? fluticasone propion-salmeterol (ADVAIR DISKUS) 100-50 mcg/dose diskus inhaler Take 1 Puff by inhalation two (2) times a day. 1 Inhaler 0   ??? rosuvastatin (CRESTOR) 5 mg tablet TAKE 1 TABLET BY MOUTH NIGHTLY 90 Tab 1   ??? traZODone (DESYREL) 50 mg tablet Take 1 Tab by mouth nightly as needed for Sleep. 20 Tab 0     Family History   Problem Relation Age of Onset   ??? Hypertension Mother    ??? Other Mother         OSA   ??? Cancer Father         suspected leukemia per  pt's spouse   ??? Cancer Maternal Aunt    ??? Alcohol abuse Maternal Grandfather      Social History     Socioeconomic History   ??? Marital status: MARRIED     Spouse name: Not on file    ??? Number of children: Not on file   ??? Years of education: Not on file   ??? Highest education level: Not on file   Occupational History   ??? Not on file   Social Needs   ??? Financial resource strain: Not on file   ??? Food insecurity     Worry: Not on file     Inability: Not on file   ??? Transportation needs     Medical: Not on file     Non-medical: Not on file   Tobacco Use   ??? Smoking status: Former Smoker   ??? Smokeless tobacco: Never Used   ??? Tobacco comment: reported as quit smoking around 1990s per spouse   Substance and Sexual Activity   ??? Alcohol use: No   ??? Drug use: Not Currently     Types: Marijuana     Comment: states she has not used marijuana in months   ??? Sexual activity: Never   Lifestyle   ??? Physical activity     Days per week: Not on file     Minutes per session: Not on file   ??? Stress: Not on file   Relationships   ??? Social Product manager on phone: Not on file     Gets together: Not on file     Attends religious service: Not on file     Active member of club or organization: Not on file     Attends meetings of clubs or organizations: Not on file     Relationship status: Not on file   ??? Intimate partner violence     Fear of current or ex partner: Not on file     Emotionally abused: Not on file     Physically abused: Not on file     Forced sexual activity: Not on file   Other Topics Concern   ??? Not on file   Social History Narrative   ??? Not on file      reports that she has quit smoking. She has never used smokeless tobacco.    Review Of Systems:  Other ROS is otherwise negative or unremarkable for 10 systems.              Physical Examination:     Visit Vitals  BP (!) 193/118 (BP 1 Location: Right leg, BP Patient Position: Sitting)   Pulse (!) 123   Temp (!) 96.1 ??F (35.6 ??C)   Resp 26   Ht 5' 8" (1.727 m)   Wt 93.2 kg (205 lb 7.5 oz)   SpO2 94%   Breastfeeding No   BMI 31.24 kg/m??       Intake/Output Summary (Last 24 hours) at 05/08/2019 0900  Last data filed at 05/07/2019 2300  Gross per 24 hour    Intake 1120 ml   Output ???   Net 1120 ml     Temp (48hrs), Avg:98.4 ??F (36.9 ??C), Min:96.1 ??F (35.6 ??C), Max:99.7 ??F (37.6 ??C)    in  no resp distress.    Speaks in short sentences.   Appears uncomfortable.    PERRL.  Oral intact.   No real significant JVP.   Lungs few bilaterally decreased more  on posterior auscultation anterior auscultation.  No wheezing.  Heart S1S2. No murmurs  abd soft diffusely tender to the lower quadrants.  Obese.  No peritoneal signs  Ext no real pitting edema.    Neuro intact.  Non-focal.  AOx3.        Review of Pertinent Study Results:   CXR.    No acute cardiopulmonary process.  2.  Stable atelectasis and nodule in the left midlung zone.  3.  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       Recent Labs     05/08/19  0000 05/07/19  1355   WBC 21.1* 21.1*   HGB 15.1 14.8   HCT 46.4 43.7   PLT 331 303   NA 138 136   K 5.0 4.3   CL 103 105   CO2 23 26   GLU 191* 159*   BUN 22 24   CREA 1.1 1.2   CA 10.0 9.8   ALB  --  4.2       ABG's: No results for input(s): PH, PCO2, PO2, HCO3, FIO2 in the last 72 hours.    Recent Results (from the past 24 hour(s))   CBC WITH AUTOMATED DIFF    Collection Time: 05/07/19  1:55 PM   Result Value Ref Range    WBC 21.1 (H) 4.0 - 11.0 1000/mm3    RBC 4.91 3.60 - 5.20 M/uL    HGB 14.8 13.0 - 17.2 gm/dl    HCT 43.7 37.0 - 50.0 %    MCV 89.0 80.0 - 98.0 fL    MCH 30.1 25.4 - 34.6 pg    MCHC 33.9 30.0 - 36.0 gm/dl    PLATELET 303 140 - 450 1000/mm3    MPV 11.0 (H) 6.0 - 10.0 fL    RDW-SD 43.6 36.4 - 46.3      NRBC 0 0 - 0      IMMATURE GRANULOCYTES 0.4 0.0 - 3.0 %    NEUTROPHILS 76.9 (H) 34 - 64 %    LYMPHOCYTES 17.0 (L) 28 - 48 %    MONOCYTES 4.9 1 - 13 %    EOSINOPHILS 0.5 0 - 5 %    BASOPHILS 0.3 0 - 3 %   METABOLIC PANEL, COMPREHENSIVE    Collection Time: 05/07/19  1:55 PM   Result Value Ref Range    Sodium 136 136 - 145 mEq/L    Potassium 4.3 3.5 - 5.1 mEq/L    Chloride 105 98 - 107 mEq/L    CO2 26 21 - 32 mEq/L    Glucose 159 (H) 74 - 106 mg/dl     BUN 24 7 - 25 mg/dl    Creatinine 1.2 0.6 - 1.3 mg/dl    GFR est AA 58.0      GFR est non-AA 48      Calcium 9.8 8.5 - 10.1 mg/dl    AST (SGOT) 7 (L) 15 - 37 U/L    ALT (SGPT) 16 12 - 78 U/L    Alk. phosphatase 108 45 - 117 U/L    Bilirubin, total 0.5 0.2 - 1.0 mg/dl    Protein, total 9.2 (H) 6.4 - 8.2 gm/dl    Albumin 4.2 3.4 - 5.0 gm/dl    Anion gap 6 5 - 15 mmol/L   LIPASE    Collection Time: 05/07/19  1:55 PM   Result Value Ref Range    Lipase 90 73 - 393 U/L   PROCALCITONIN  Collection Time: 05/07/19  1:55 PM   Result Value Ref Range    PROCALCITONIN <0.05 0.00 - 0.50 ng/ml   POC URINE MACROSCOPIC    Collection Time: 05/07/19  3:46 PM   Result Value Ref Range    Glucose Negative NEGATIVE,Negative mg/dl    Bilirubin Small (A) NEGATIVE,Negative      Ketone Negative NEGATIVE,Negative mg/dl    Specific gravity >=1.030 1.005 - 1.030      Blood Moderate (A) NEGATIVE,Negative      pH (UA) 5.0 5 - 9      Protein >=300 (A) NEGATIVE,Negative mg/dl    Urobilinogen 0.2 0.0 - 1.0 EU/dl    Nitrites Negative NEGATIVE,Negative      Leukocyte Esterase Negative NEGATIVE,Negative      Color Amber      Appearance Cloudy     COVID-19 (INPATIENT TESTING)    Collection Time: 05/07/19  7:00 PM    Specimen: NASOPHARYNGEAL SWAB   Result Value Ref Range    COVID-19 NEGATIVE NEGATIVE     LACTIC ACID    Collection Time: 05/07/19  8:48 PM   Result Value Ref Range    Lactic Acid 1.2 0.4 - 2.0 mmol/L   TROPONIN I    Collection Time: 05/07/19  8:48 PM   Result Value Ref Range    Troponin-I <0.015 0.000 - 0.045 ng/ml   GLUCOSE, POC    Collection Time: 05/07/19  9:57 PM   Result Value Ref Range    Glucose (POC) 189 (H) 65 - 105 mg/dL   CBC WITH AUTOMATED DIFF    Collection Time: 05/08/19 12:00 AM   Result Value Ref Range    WBC 21.1 (H) 4.0 - 11.0 1000/mm3    RBC 5.20 3.60 - 5.20 M/uL    HGB 15.1 13.0 - 17.2 gm/dl    HCT 46.4 37.0 - 50.0 %    MCV 89.2 80.0 - 98.0 fL    MCH 29.0 25.4 - 34.6 pg    MCHC 32.5 30.0 - 36.0 gm/dl     PLATELET 331 140 - 450 1000/mm3    MPV 11.0 (H) 6.0 - 10.0 fL    RDW-SD 43.8 36.4 - 46.3      NRBC 0 0 - 0      IMMATURE GRANULOCYTES 0.7 0.0 - 3.0 %    NEUTROPHILS 93.5 (H) 34 - 64 %    LYMPHOCYTES 4.6 (L) 28 - 48 %    MONOCYTES 1.1 1 - 13 %    EOSINOPHILS 0.0 0 - 5 %    BASOPHILS 0.1 0 - 3 %   METABOLIC PANEL, BASIC    Collection Time: 05/08/19 12:00 AM   Result Value Ref Range    Sodium 138 136 - 145 mEq/L    Potassium 5.0 3.5 - 5.1 mEq/L    Chloride 103 98 - 107 mEq/L    CO2 23 21 - 32 mEq/L    Glucose 191 (H) 74 - 106 mg/dl    BUN 22 7 - 25 mg/dl    Creatinine 1.1 0.6 - 1.3 mg/dl    GFR est AA >60.0      GFR est non-AA 53      Calcium 10.0 8.5 - 10.1 mg/dl    Anion gap 12 5 - 15 mmol/L   TROPONIN I    Collection Time: 05/08/19 12:00 AM   Result Value Ref Range    Troponin-I <0.015 0.000 - 0.045 ng/ml   TROPONIN I    Collection  Time: 05/08/19 12:42 AM   Result Value Ref Range    Troponin-I <0.015 0.000 - 0.045 ng/ml   GLUCOSE, POC    Collection Time: 05/08/19  5:49 AM   Result Value Ref Range    Glucose (POC) 168 (H) 65 - 105 mg/dL   LACTIC ACID    Collection Time: 05/08/19  6:51 AM   Result Value Ref Range    Lactic Acid 2.2 (H) 0.4 - 2.0 mmol/L   PROCALCITONIN    Collection Time: 05/08/19  6:51 AM   Result Value Ref Range    PROCALCITONIN <0.05 0.00 - 0.50 ng/ml   GLUCOSE, POC    Collection Time: 05/08/19  7:44 AM   Result Value Ref Range    Glucose (POC) 171 (H) 65 - 105 mg/dL       Genia Harold, MD

## 2019-05-08 NOTE — Progress Notes (Signed)
RRT Note:  patient with complaints of SOB and "all over" pain. Appears very anxious and flushed. SBP elevated,200. Lungs clear to auscultation bilaterally, O2 Sats 100%. Temp 100. Instructed bedside RN to administer tylenol, PRN Morphine for pain and PRN hydralazine for elevated BP.     0555-[facility] on call paged.    0601-Dr. Matriano returned page. Orders to give 2mg of Morphine in 30 mins if needed and continue to monitor.     0605-Patient appears much calmer, respirations improving, SBP 170's. Patient states she is feeling better. Will follow up later this morning. Instructed RN to call RRT if any changes or patient not improving.

## 2019-05-08 NOTE — Progress Notes (Signed)
Repeat VS charted and Repeat EKG completed.

## 2019-05-08 NOTE — Consults (Addendum)
Gastroenterology Consult    Patient: [name] FantasiaMonica [name] MRN: [id number]  SSN: [id number]    Date of Birth: 08/23/1950  Age: 68 y.o.  Sex: female      Assessment:   --Nausea, Vomiting and Diarrhea with associated Abdominal pain suspect gastroenteritis/ infectious source. Other Ddx: inflammatory vs ischemic source vs adhesive disease from prior abdominal surgery   -CT 05/07/2019 reveals colonic diverticuli.  Terminal ileum, appendix, stomach and small bowel loops are within normal limits.  --Splenic hypodensities 1.6 to 1.1 cm on CT unchanged from prior study  --Hx of Foreign Body in the Sigmoid Colon s/p Colonoscopy 08/16/2014 noting a large bone wedged in the sigmoid colon wall-removed. No evidence of perforation. 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 S/p 12wk Harvoni treatment per Dr. Lendell CapriceSullivan. SVR achieved. Undetectable viral load week 4 and at conclusion of treatment   -Liver unremarkable on CT. 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 No stent placed, but biliary brushings performed-Cytology was negative for malignancy  --COPD Exacerbation per Primary. COVID-19 negative   --Acute Hypoxic Respiratory Failure, now maintained on NC O2 at 2L   --Hypertension   --Diabetes Mellitus  --Chronic pain syndrome due to Hip pain s/p right hip replacement  Plan:   --Monitor CBC, BMP  --Will order Stool studies, and HCV RNA PCR   --Start Protonix 40mg  every day for now   --Continue Antiemetics PRN N/V  --Full liquid diet for now, Advance diet as tolerated in am   --Continue conservative management   --More recommendations pending above results and pts clinical condition   Subjective:      [name] FantasiaMonica Petrizzo is a 68 y.o. female who is being seen for nausea, vomiting with associated abdominal pain and diarrhea.  The patient states her  symptoms began approximately 48 hours ago and seemed to progressively worsen.  She reports experiencing abdominal pain more prominent to her lower abdomen rating 7-8 out of 10.  She also states that she feels nauseated currently but reports her last episode of vomiting just prior to hospitalization consisting of undigested food.  She denies hematemesis.  She denies change in appetite or weight loss.  She reports her normal bowel pattern to consist of daily soft brown color stools but states over the last 3 days she has had upwards of 3-4 BMs per day with her last BM last night.  She denies hematochezia or melena.  Patient also reported experiencing a cough and increasing shortness of breath at the time of admission which she states currently has improved.  She denies chest pain, dizziness, fever but does report experiencing chills.  She denies starting any new medications, NSAIDs or anticoagulants.  Most recent endoscopic evaluation as noted above.  She denies any known family history of chronic GI illnesses or malignancies.  She denies any other complaints at this time.    Hospital Problems  Date Reviewed: 08/08/2018          Codes Class Noted POA    COPD exacerbation (HCC) ICD-10-CM: J44.1  ICD-9-CM: 491.21  05/07/2019 Unknown        Acute hypoxemic respiratory failure [facility](HCC) ICD-10-CM: J96.01  ICD-9-CM: 518.81  05/07/2019 Unknown            Past Medical History:   Diagnosis Date   ??? Arthritis    ??? Chronic pain syndrome  related to R hip replacement   ??? 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 DM2 (diabetes mellitus, type 2) ([place])    ??? GERD (gastroesophageal reflux disease)    ??? Hepatitis C     HEP C   ??? HTN (hypertension)    ??? Lung nodule, multiple     (CT 10/2016) Small left upper lobe and 1.7 x 1.6 cm left lower lobe nodules not significantly changed from 07/23/2016 and 03/22/2016   ??? Marijuana use    ??? PTSD (post-traumatic stress disorder)      lived through the [place] in 1993   ??? Thoracic ascending aortic aneurysm ([place])     4.4cm noted on CT Chest (10/2016)   ??? Thyroid nodule     2.5 cm stable right thyroid nodule (CT 10/2016)     Past Surgical History:   Procedure Laterality Date   ??? HX ENDOSCOPY     ??? HX HERNIA REPAIR  2019    x2   ??? HX HIP REPLACEMENT Right 01/29/2015   ??? HX HIP REPLACEMENT     ??? HX HYSTERECTOMY  2010    hysterectomy      Family History   Problem Relation Age of Onset   ??? Hypertension Mother    ??? Other Mother         OSA   ??? Cancer Father         suspected leukemia per pt's spouse   ??? Cancer Maternal Aunt    ??? Alcohol abuse Maternal Grandfather      Social History     Tobacco Use   ??? Smoking status: Former Smoker   ??? Smokeless tobacco: Never Used   ??? Tobacco comment: reported as quit smoking around 1990s per spouse   Substance Use Topics   ??? Alcohol use: No      Current Facility-Administered Medications   Medication Dose Route Frequency Provider Last Rate Last Dose   ??? cetirizine (ZYRTEC) tablet 10 mg  10 mg Oral DAILY Charlaine Dalton, MD       ??? cloNIDine HCL (CATAPRES) tablet 0.3 mg  0.3 mg Oral BID Charlaine Dalton, MD   0.3 mg at 05/08/19 0458   ??? dilTIAZem ER (CARDIZEM CD) capsule 240 mg  240 mg Oral DAILY Charlaine Dalton, MD       ??? fluticasone-vilanterol (BREO ELLIPTA) 161mcg-25mcg/puff  1 Puff Inhalation DAILY Charlaine Dalton, MD       ??? gabapentin (NEURONTIN) capsule 300 mg  300 mg Oral TID Charlaine Dalton, MD   300 mg at 05/07/19 2243   ??? guaiFENesin ER (MUCINEX) tablet 600 mg  600 mg Oral Q12H Charlaine Dalton, MD   600 mg at 05/07/19 2243   ??? losartan (COZAAR) tablet 50 mg  50 mg Oral DAILY Charlaine Dalton, MD       ??? methocarbamoL (ROBAXIN) tablet 500 mg  500 mg Oral QID Charlaine Dalton, MD   500 mg at 05/07/19 2243   ??? montelukast (SINGULAIR) tablet 10 mg  10 mg Oral QHS Charlaine Dalton, MD   10 mg at 05/07/19 2243   ??? rosuvastatin (CRESTOR) tablet 20 mg  20 mg Oral QHS Charlaine Dalton, MD    20 mg at 05/07/19 2242   ??? spironolactone (ALDACTONE) tablet 100 mg  100 mg Oral DAILY Charlaine Dalton, MD       ??? tiotropium bromide (SPIRIVA RESPIMAT) 2.5 mcg /actuation  1  Puff Inhalation DAILY Roderic Scarce, MD       ??? traZODone (DESYREL) tablet 50 mg  50 mg Oral QHS PRN Roderic Scarce, MD   50 mg at 05/08/19 0008   ??? sodium chloride (NS) flush 5-10 mL  5-10 mL IntraVENous Q8H Roderic Scarce, MD   10 mL at 05/08/19 8333   ??? sodium chloride (NS) flush 5-10 mL  5-10 mL IntraVENous PRN Roderic Scarce, MD       ??? naloxone (NARCAN) injection 0.1 mg  0.1 mg IntraVENous PRN Roderic Scarce, MD       ??? acetaminophen (TYLENOL) tablet 650 mg  650 mg Oral Q6H PRN Roderic Scarce, MD   650 mg at 05/08/19 0601   ??? HYDROcodone-acetaminophen (NORCO) 5-325 mg per tablet 1 Tab  1 Tab Oral Q4H PRN Roderic Scarce, MD   1 Tab at 05/08/19 0405   ??? morphine injection 1 mg  1 mg IntraVENous Q4H PRN Roderic Scarce, MD   1 mg at 05/08/19 0602   ??? alum-mag hydroxide-simeth (MYLANTA) oral suspension 30 mL  30 mL Oral Q4H PRN Roderic Scarce, MD       ??? albuterol (PROVENTIL VENTOLIN) nebulizer solution 2.5 mg  2.5 mg Nebulization Q6H PRN Roderic Scarce, MD       ??? heparin (porcine) injection 5,000 Units  5,000 Units SubCUTAneous Q8H Roderic Scarce, MD   5,000 Units at 05/08/19 8329   ??? methylPREDNISolone (PF) (SOLU-MEDROL) injection 40 mg  40 mg IntraVENous Q8H Roderic Scarce, MD   40 mg at 05/08/19 0326   ??? hydrALAZINE (APRESOLINE) 20 mg/mL injection 10 mg  10 mg IntraVENous Q6H PRN Roderic Scarce, MD   10 mg at 05/08/19 0600   ??? dextrose (D50) infusion 5-25 g  10-50 mL IntraVENous PRN Roderic Scarce, MD       ??? glucagon (GLUCAGEN) injection 1 mg  1 mg IntraMUSCular PRN Roderic Scarce, MD       ??? insulin glargine (LANTUS) injection 1-100 Units  1-100 Units SubCUTAneous QHS Roderic Scarce, MD   14 Units at 05/07/19 2244   ??? insulin lispro (HUMALOG) injection 1-100 Units  1-100 Units SubCUTAneous  AC&HS Roderic Scarce, MD   1 Units at 05/07/19 2243   ??? insulin lispro (HUMALOG) injection 1-100 Units  1-100 Units SubCUTAneous PRN Roderic Scarce, MD          Allergies   Allergen Reactions   ??? Chocolate [Cocoa] Sneezing     Confirms allergy but reports "I still eat it"     Review of Systems:  A 12 point review of systems were obtained with pertinent positives noted in above HPI, otherwise negative     Objective:     Patient Vitals for the past 24 hrs:   Temp Pulse Resp BP SpO2   05/08/19 0847 (!) 96.1 ??F (35.6 ??C) (!) 123 26 (!) 193/118 94 %   05/08/19 0655 98 ??F (36.7 ??C) (!) 121 ??? (!) 143/112 ???   05/08/19 0554 ??? (!) 129 ??? (!) 202/131 100 %   05/08/19 0400 ??? ??? ??? (!) 181/132 ???   05/08/19 0400 99.7 ??F (37.6 ??C) (!) 124 20 (!) 181/123 96 %   05/08/19 0027 ??? (!) 117 ??? (!) 144/128 95 %   05/07/19 2359 ??? ??? ??? (!) 196/137 ???   05/07/19 2359 98.4 ??F (36.9 ??C) (!) 58 22 (!) 200/150 94 %   05/07/19 2158 99 ??F (37.2 ??C) (!) 110 20 (!) 167/103 96 %   05/07/19  2032 ??? 97 19 (!) 160/109 99 %   05/07/19 1947 ??? (!) 126 ??? (!) 153/104 96 %   05/07/19 1902 ??? 86 17 (!) 150/96 99 %   05/07/19 1900 ??? ??? ??? ??? 97 %   05/07/19 1755 ??? ??? ??? ??? 90 %   05/07/19 1632 98.5 ??F (36.9 ??C) 86 ??? (!) 159/92 95 %   05/07/19 1555 ??? (!) 115 26 (!) 136/107 95 %   05/07/19 1516 98.8 ??F (37.1 ??C) (!) 102 19 (!) 154/103 100 %   05/07/19 1433 ??? ??? ??? ??? 99 %   05/07/19 1353 ??? ??? ??? (!) 167/97 ???   05/07/19 1337 98.3 ??F (36.8 ??C) ??? ??? ??? ???   05/07/19 1332 ??? (!) 114 30 ??? 94 %   05/07/19 1331 ??? (!) 119 30 (!) 204/144 ???   05/07/19 1330 ??? (!) 119 (!) 33 ??? (!) 89 %     Physical Exam:  GENERAL: alert, pleasant, cooperative, no distress, appears stated age  EYE: No scleral icterus noted   LUNG: Symmetric chest expansion, diminished breath sounds, no wheeze, rhonchi or rales  HEART: Tachycardic  ABDOMEN: soft, obese, diffuse tenderness appreciated on palpation more prominent across lower abdomen, no guarding, bowel sounds active. No  palpable masses, hernias or organomegaly appreciated   EXTREMITIES: no edema  SKIN: no rash or abnormalities  NEUROLOGIC: AOx3    Diagnostic Imaging:  Xr Chest Sngl V    Result Date: 05/07/2019  EXAM:  Chest AP INDICATIONS: SOB, hypoxia COMPARISON: Most recently 03/13/2019. FINDINGS: There is an unchanged density in the left midlung zone just cranial to the known left lower lobe nodule. This was shown to be atelectasis on previous CT. The nodule is also unchanged in size. No new abnormalities are prominent bullous disease at the left lung apex. Normal cardiomediastinal contours.     IMPRESSION: 1.  No acute cardiopulmonary process. 2.  Stable atelectasis and nodule in the left midlung zone. 3.  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     Ct Abd Pelv W Cont    Result Date: 05/07/2019  DICOM format image data is available to non-affiliated external healthcare facilities or entities on a secure, media free, reciprocally searchable basis with patient authorization for 12 months following the date of the study. Clinical history: Left lower quadrant pain, elevated blood blood cells EXAMINATION: CT scan of the abdomen and pelvis with intravenous contrast 05/07/2019. 5 mm spiral scanning is performed from the costophrenic angles to the symphysis pubis. Coronal and sagittal reconstruction imaging has been obtained. Correlation: 05/11/2018 and 02/22/2018 FINDINGS: There is respiratory motion artifact. There are bullous changes lung bases. Osteopenia and degenerative changes of the spine. Right hip prosthesis. Liver, gallbladder, spleen, adrenal glands are unremarkable. 1.6 and 1.1 cm splenic hypodensities unchanged from prior study. 2 mm nonobstructing right nephrolithiasis. Left kidney and bladder are unremarkable. Uterus is absent. There are colonic diverticuli. Terminal ileum, appendix, stomach and small bowel loops are within normal limits. Atherosclerotic calcification of the abdominal aorta. No dominant  lymph node enlargement or free fluid.     IMPRESSION: 1. 2 mm nonobstructing right nephrolithiasis. 2. Small stable right renal cyst. 3. Diverticulosis. 4. Hysterectomy. 5. 1.6 and 1.1 cm stable splenic lesions.     Laboratory Results:    Labs: Results:   Chemistry Recent Labs     05/08/19  0000 05/07/19  1355   GLU 191* 159*   NA 138 136   K 5.0  4.3   CL 103 105   CO2 23 26   BUN 22 24   CREA 1.1 1.2   CA 10.0 9.8   AGAP 12 6   AP  --  108   TP  --  9.2*   ALB  --  4.2    Estimated Creatinine Clearance: 60 mL/min (based on SCr of 1.1 mg/dL).   CBC w/Diff Recent Labs     05/08/19  0000 05/07/19  1355   WBC 21.1* 21.1*   RBC 5.20 4.91   HGB 15.1 14.8   HCT 46.4 43.7   PLT 331 303   GRANS 93.5* 76.9*   LYMPH 4.6* 17.0*   EOS 0.0 0.5      Cardiac Enzymes No results for input(s): CPK, CKND1, MYO in the last 72 hours.    No lab exists for component: CKRMB, TROIP   Coagulation No results for input(s): PTP, INR, APTT, INREXT in the last 72 hours.    Hepatitis Panel Lab Results   Component Value Date/Time    Hepatitis B surface Ag NON-REACTIVE 09/08/2017 01:55 PM    Hepatitis B surface Ab NON-REACTIVE 09/08/2017 01:55 PM     Recent Labs     05/07/19  1355   ALT 16   TBILI 0.5   AP 108   ALB 4.2   TP 9.2*         Amylase Lipase    Liver Enzymes Recent Labs     05/07/19  1355   TP 9.2*   ALB 4.2   AP 108   ALT 16      Thyroid Studies No results for input(s): T4, T3U, TSH, TSHEXT in the last 72 hours.    No lab exists for component: T3RU     Pathology pathology     Signed By: Myrla Halsted MS, APRN, FNP-C    May 08, 2019     Office: [phone]

## 2019-05-08 NOTE — Progress Notes (Signed)
 RRT Note:  patient with complaints of SOB and all over pain. Appears very anxious and flushed. SBP elevated,200. Lungs clear to auscultation bilaterally, O2 Sats 100%. Temp 100. Instructed bedside RN to administer tylenol , PRN Morphine for pain and PRN hydralazine for elevated BP.     0555-[facility] on call paged.    0601-DrSABRA Chard returned page. Orders to give 2mg  of Morphine in 30 mins if needed and continue to monitor.     0605-Patient appears much calmer, respirations improving, SBP 170's. Patient states she is feeling better. Will follow up later this morning. Instructed RN to call RRT if any changes or patient not improving.

## 2019-05-08 NOTE — Consults (Signed)
Consults by Genia Harold, MD at 05/08/19 0900                Author: Genia Harold, MD  Service: Pulmonary Disease  Author Type: Physician       Filed: 05/08/19 1501  Date of Service: 05/08/19 0900  Status: Signed          Editor: Genia Harold, MD (Physician)                 Pulmonary Consultation: IV             IMPRESSION:   ??     COPD without exacerbation: Maintained on Spiriva and Advair follows with Dr. Raynelle Jan   ??  Significant bullous emphysema: Unevenly distributed.  Bilateral large bullous disease     ??  Stable pulmonary nodule    ??  Abdominal pain of unclear etiology.   ??  Small 2 mm nonobstructing kidney stone.   ??  History of prior abdominal surgery with mesh.   ??  Chronic pain syndrome with hip pain status post hip replacement right side   ??  GERD   ??  Obesity:Body mass index is 31.24 kg/m??.   ??        RECOMMENDATIONS:      Continue usual care and COPD treatment as outpatient.   I discussed the findings on the CT scans from before with the patient and her husband.  She may be candidate for bullectomies  in the future although she has chronic comorbid conditions and may not be the best surgical candidate.   She denies any exacerbation of her breathing at this time so I would not treat her with steroids.   Defer management of abdominal pains and other etiologies to the primary team.   We will sign off follow-up as outpatient with Dr. Leonia Reader   Call back as needed.            ??                          Subjective:    [name] [name] is an 68 y.o.  female with PMHx as detailed below including h/o COPD Who was admitted on 05/07/2019  for malaise and abdominal pains.  This has been ongoing.  She has some diarrhea as well.  She tested negative for Covid.  Per record she had some shortness of breath but the patient denied that.  Husband is here with her in the room and he denied any  worsening of his breathing.   The patient is maintained on Spiriva and Advair.  Does not have oxygen at home.  Still able to  walk but limited will use uses a walker to walk due to some hip pain and chronic pains otherwise..        Patient said she has been feeling nauseous, had some vomiting and loose bowel movement, developed progressive abdominal pain more on the left than the right                           Past Medical History:        Diagnosis  Date         ?  Arthritis       ?  Chronic pain syndrome            related to R hip replacement         ?  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  DM2 (diabetes mellitus, type 2) ([place])       ?  GERD (gastroesophageal reflux disease)       ?  Hepatitis C            HEP C         ?  HTN (hypertension)       ?  Lung nodule, multiple            (CT 10/2016) Small left upper lobe and 1.7 x 1.6 cm left lower lobe nodules not significantly changed from 07/23/2016 and 03/22/2016         ?  Marijuana use       ?  PTSD (post-traumatic stress disorder)            lived through the [place] in 1993         ?  Thoracic ascending aortic aneurysm ([place])            4.4cm noted on CT Chest (10/2016)         ?  Thyroid nodule            2.5 cm stable right thyroid nodule (CT 10/2016)          Past Surgical History:         Procedure  Laterality  Date          ?  HX ENDOSCOPY         ?  HX HERNIA REPAIR    2019          x2          ?  HX HIP REPLACEMENT  Right  01/29/2015     ?  HX HIP REPLACEMENT         ?  HX HYSTERECTOMY    2010          hysterectomy          Allergies        Allergen  Reactions         ?  Chocolate [Cocoa]  Sneezing             Confirms allergy but reports "I still eat it"          No current facility-administered medications on file prior to encounter.           Current Outpatient Medications on File Prior to Encounter          Medication  Sig  Dispense  Refill           ?  methocarbamoL (Robaxin) 500 mg tablet  Take 1 Tab by mouth four (4) times daily.  20 Tab  0     ?  ibuprofen (MOTRIN) 600 mg tablet  Take 1  Tab by mouth every six (6) hours as needed for Pain.  20 Tab  0           ?  butalbital-acetaminophen-caff (FIORICET) 50-300-40 mg per capsule  Take 1 Cap by mouth every four (4) hours as needed for Headache.  30 Cap  0           ?  dilTIAZem CD (CARDIZEM CD) 240 mg ER capsule  Take 1 Cap by mouth daily.  30 Cap  1     ?  losartan (COZAAR)  50 mg tablet  Take 1 Tab by mouth daily.  30 Tab  1     ?  cetirizine (ZYRTEC) 10 mg tablet  Take 1 Tab by mouth daily.  30 Tab  1     ?  fluticasone propionate (FLONASE) 50 mcg/actuation nasal spray  2 Sprays by Both Nostrils route daily. (Patient taking differently: 2 Sprays by Both Nostrils route daily as needed.)  1 Bottle  1     ?  guaiFENesin ER (MUCINEX) 600 mg ER tablet  Take 1 Tab by mouth every twelve (12) hours.  20 Tab  0     ?  LANTUS SOLOSTAR U-100 INSULIN 100 unit/mL (3 mL) inpn  INJECT 32 UNITS SUBCUTANEOUSLY  NIGHTLY  15 mL  0     ?  albuterol (ACCUNEB) 1.25 mg/3 mL nebu  USE 1 VIAL IN NEBULIZER EVERY 6 HOURS AS NEEDED FOR  SHORTNESS  OF  BREATH,  WHEEZING  270 mL  1     ?  cloNIDine HCl (CATAPRES) 0.1 mg tablet  Take 3 Tabs by mouth two (2) times a day.  60 Tab  1     ?  spironolactone (ALDACTONE) 100 mg tablet  TAKE 1 TABLET EVERY DAY  90 Tab  0     ?  metFORMIN ER (GLUCOPHAGE XR) 500 mg tablet  TAKE 1 TABLET TWICE DAILY  180 Tab  1     ?  montelukast (SINGULAIR) 10 mg tablet  TAKE 1 TABLET EVERY DAY  90 Tab  3     ?  gabapentin (NEURONTIN) 300 mg capsule  Take 1 Cap by mouth three (3) times daily. Max Daily Amount: 900 mg.  270 Cap  1     ?  polyethylene glycol (MIRALAX) 17 gram packet  Take 1 Packet by mouth daily. (Patient taking differently: Take 17 g by mouth daily as needed for Constipation.)  30 Packet  1     ?  magnesium oxide (MAG-OX) 400 mg tablet  Take 400 mg by mouth daily.         ?  QUEtiapine SR (SEROQUEL XR) 150 mg sr tablet  Take 150 mg by mouth nightly as needed.         ?  tiotropium bromide (SPIRIVA RESPIMAT) 2.5 mcg/actuation inhaler  Take 1  Puff by inhalation daily.  1 Inhaler  0     ?  fluticasone propion-salmeterol (ADVAIR DISKUS) 100-50 mcg/dose diskus inhaler  Take 1 Puff by inhalation two (2) times a day.  1 Inhaler  0     ?  rosuvastatin (CRESTOR) 5 mg tablet  TAKE 1 TABLET BY MOUTH NIGHTLY  90 Tab  1           ?  traZODone (DESYREL) 50 mg tablet  Take 1 Tab by mouth nightly as needed for Sleep.  20 Tab  0          Family History         Problem  Relation  Age of Onset          ?  Hypertension  Mother       ?  Other  Mother                OSA          ?  Cancer  Father                suspected leukemia per pt's spouse          ?  Cancer  Maternal Aunt            ?  Alcohol abuse  Maternal Grandfather            Social History          Socioeconomic History         ?  Marital status:  MARRIED              Spouse name:  Not on file         ?  Number of children:  Not on file     ?  Years of education:  Not on file     ?  Highest education level:  Not on file       Occupational History        ?  Not on file       Social Needs         ?  Financial resource strain:  Not on file        ?  Food insecurity              Worry:  Not on file         Inability:  Not on file        ?  Transportation needs              Medical:  Not on file         Non-medical:  Not on file       Tobacco Use         ?  Smoking status:  Former Smoker     ?  Smokeless tobacco:  Never Used        ?  Tobacco comment: reported as quit smoking around 1990s per spouse       Substance and Sexual Activity         ?  Alcohol use:  No     ?  Drug use:  Not Currently              Types:  Marijuana             Comment: states she has not used marijuana in months         ?  Sexual activity:  Never       Lifestyle        ?  Physical activity              Days per week:  Not on file         Minutes per session:  Not on file         ?  Stress:  Not on file       Relationships        ?  Social Health visitor on phone:  Not on file         Gets together:  Not on file          Attends religious service:  Not on file         Active member of club or organization:  Not on file         Attends meetings of clubs or organizations:  Not on file         Relationship status:  Not on file        ?  Intimate partner violence  Fear of current or ex partner:  Not on file         Emotionally abused:  Not on file         Physically abused:  Not on file         Forced sexual activity:  Not on file        Other Topics  Concern        ?  Not on file       Social History Narrative        ?  Not on file         reports that she has quit smoking. She has never used smokeless tobacco.        Review Of Systems:  Other ROS is otherwise negative or unremarkable for 10 systems.                        Physical Examination:        Visit Vitals      BP  (!) 193/118 (BP 1 Location: Right leg, BP Patient Position: Sitting)     Pulse  (!) 123     Temp  (!) 96.1 ??F (35.6 ??C)     Resp  26     Ht  '5\' 8"'  (1.727 m)     Wt  93.2 kg (205 lb 7.5 oz)     SpO2  94%     Breastfeeding  No        BMI  31.24 kg/m??           Intake/Output Summary (Last 24 hours) at 05/08/2019 0900   Last data filed at 05/07/2019 2300     Gross per 24 hour        Intake  1120 ml        Output  --        Net  1120 ml        Temp (48hrs), Avg:98.4 ??F (36.9 ??C), Min:96.1 ??F (35.6 ??C), Max:99.7 ??F (37.6 ??C)      in  no resp distress.     Speaks in short sentences.    Appears uncomfortable.     PERRL.   Oral intact.    No real significant JVP.    Lungs few bilaterally decreased more on posterior auscultation anterior auscultation.  No wheezing.   Heart S1S2. No murmurs   abd soft diffusely tender to the lower quadrants.  Obese.  No peritoneal signs   Ext no real pitting edema.     Neuro intact.  Non-focal.  AOx3.             Review of Pertinent Study Results:     CXR.     No acute cardiopulmonary process.   2.  Stable atelectasis and nodule in the left midlung zone.   3.  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            Recent Labs             05/08/19   0000  05/07/19   1355     WBC  21.1*  21.1*     HGB  15.1  14.8     HCT  46.4  43.7     PLT  331  303     NA  138  136     K  5.0  4.3     CL  103  105  CO2  23  26     GLU  191*  159*     BUN  22  24     CREA  1.1  1.2     CA  10.0  9.8         ALB   --   4.2         ABG's: No results for input(s): PH, PCO2, PO2, HCO3, FIO2 in the last 72 hours.        Recent Results (from the past 24 hour(s))     CBC WITH AUTOMATED DIFF          Collection Time: 05/07/19  1:55 PM         Result  Value  Ref Range            WBC  21.1 (H)  4.0 - 11.0 1000/mm3       RBC  4.91  3.60 - 5.20 M/uL       HGB  14.8  13.0 - 17.2 gm/dl       HCT  43.7  37.0 - 50.0 %       MCV  89.0  80.0 - 98.0 fL       MCH  30.1  25.4 - 34.6 pg       MCHC  33.9  30.0 - 36.0 gm/dl       PLATELET  303  140 - 450 1000/mm3       MPV  11.0 (H)  6.0 - 10.0 fL       RDW-SD  43.6  36.4 - 46.3         NRBC  0  0 - 0         IMMATURE GRANULOCYTES  0.4  0.0 - 3.0 %       NEUTROPHILS  76.9 (H)  34 - 64 %       LYMPHOCYTES  17.0 (L)  28 - 48 %       MONOCYTES  4.9  1 - 13 %       EOSINOPHILS  0.5  0 - 5 %       BASOPHILS  0.3  0 - 3 %       METABOLIC PANEL, COMPREHENSIVE          Collection Time: 05/07/19  1:55 PM         Result  Value  Ref Range            Sodium  136  136 - 145 mEq/L       Potassium  4.3  3.5 - 5.1 mEq/L       Chloride  105  98 - 107 mEq/L       CO2  26  21 - 32 mEq/L       Glucose  159 (H)  74 - 106 mg/dl       BUN  24  7 - 25 mg/dl       Creatinine  1.2  0.6 - 1.3 mg/dl       GFR est AA  58.0          GFR est non-AA  48          Calcium  9.8  8.5 - 10.1 mg/dl       AST (SGOT)  7 (L)  15 - 37 U/L       ALT (SGPT)  16  12 - 78 U/L  Alk. phosphatase  108  45 - 117 U/L       Bilirubin, total  0.5  0.2 - 1.0 mg/dl       Protein, total  9.2 (H)  6.4 - 8.2 gm/dl       Albumin  4.2  3.4 - 5.0 gm/dl       Anion gap  6  5 - 15 mmol/L       LIPASE          Collection Time: 05/07/19  1:55 PM         Result  Value  Ref Range             Lipase  90  73 - 393 U/L       PROCALCITONIN          Collection Time: 05/07/19  1:55 PM         Result  Value  Ref Range            PROCALCITONIN  <0.05  0.00 - 0.50 ng/ml       POC URINE MACROSCOPIC          Collection Time: 05/07/19  3:46 PM         Result  Value  Ref Range            Glucose  Negative  NEGATIVE,Negative mg/dl       Bilirubin  Small (A)  NEGATIVE,Negative         Ketone  Negative  NEGATIVE,Negative mg/dl       Specific gravity  >=1.030  1.005 - 1.030         Blood  Moderate (A)  NEGATIVE,Negative         pH (UA)  5.0  5 - 9         Protein  >=300 (A)  NEGATIVE,Negative mg/dl       Urobilinogen  0.2  0.0 - 1.0 EU/dl            Nitrites  Negative  NEGATIVE,Negative              Leukocyte Esterase  Negative  NEGATIVE,Negative         Color  Amber          Appearance  Cloudy          COVID-19 (INPATIENT TESTING)          Collection Time: 05/07/19  7:00 PM       Specimen: NASOPHARYNGEAL SWAB         Result  Value  Ref Range            COVID-19  NEGATIVE  NEGATIVE         LACTIC ACID          Collection Time: 05/07/19  8:48 PM         Result  Value  Ref Range            Lactic Acid  1.2  0.4 - 2.0 mmol/L       TROPONIN I          Collection Time: 05/07/19  8:48 PM         Result  Value  Ref Range            Troponin-I  <0.015  0.000 - 0.045 ng/ml       GLUCOSE, POC          Collection Time: 05/07/19  9:57 PM  Result  Value  Ref Range            Glucose (POC)  189 (H)  65 - 105 mg/dL       CBC WITH AUTOMATED DIFF          Collection Time: 05/08/19 12:00 AM         Result  Value  Ref Range            WBC  21.1 (H)  4.0 - 11.0 1000/mm3       RBC  5.20  3.60 - 5.20 M/uL       HGB  15.1  13.0 - 17.2 gm/dl       HCT  46.4  37.0 - 50.0 %       MCV  89.2  80.0 - 98.0 fL       MCH  29.0  25.4 - 34.6 pg       MCHC  32.5  30.0 - 36.0 gm/dl       PLATELET  331  140 - 450 1000/mm3       MPV  11.0 (H)  6.0 - 10.0 fL       RDW-SD  43.8  36.4 - 46.3         NRBC  0  0 - 0         IMMATURE GRANULOCYTES  0.7   0.0 - 3.0 %       NEUTROPHILS  93.5 (H)  34 - 64 %       LYMPHOCYTES  4.6 (L)  28 - 48 %       MONOCYTES  1.1  1 - 13 %       EOSINOPHILS  0.0  0 - 5 %       BASOPHILS  0.1  0 - 3 %       METABOLIC PANEL, BASIC          Collection Time: 05/08/19 12:00 AM         Result  Value  Ref Range            Sodium  138  136 - 145 mEq/L       Potassium  5.0  3.5 - 5.1 mEq/L       Chloride  103  98 - 107 mEq/L       CO2  23  21 - 32 mEq/L       Glucose  191 (H)  74 - 106 mg/dl       BUN  22  7 - 25 mg/dl       Creatinine  1.1  0.6 - 1.3 mg/dl       GFR est AA  >60.0          GFR est non-AA  53          Calcium  10.0  8.5 - 10.1 mg/dl       Anion gap  12  5 - 15 mmol/L       TROPONIN I          Collection Time: 05/08/19 12:00 AM         Result  Value  Ref Range            Troponin-I  <0.015  0.000 - 0.045 ng/ml       TROPONIN I          Collection Time: 05/08/19 12:42 AM         Result  Value  Ref Range            Troponin-I  <0.015  0.000 - 0.045 ng/ml       GLUCOSE, POC          Collection Time: 05/08/19  5:49 AM         Result  Value  Ref Range            Glucose (POC)  168 (H)  65 - 105 mg/dL       LACTIC ACID          Collection Time: 05/08/19  6:51 AM         Result  Value  Ref Range            Lactic Acid  2.2 (H)  0.4 - 2.0 mmol/L       PROCALCITONIN          Collection Time: 05/08/19  6:51 AM         Result  Value  Ref Range            PROCALCITONIN  <0.05  0.00 - 0.50 ng/ml       GLUCOSE, POC          Collection Time: 05/08/19  7:44 AM         Result  Value  Ref Range            Glucose (POC)  171 (H)  65 - 105 mg/dL           Genia Harold, MD

## 2019-05-08 NOTE — Progress Notes (Signed)
CM attempted to complete dcp, but the patient was having pain.  CM will follow up.

## 2019-05-08 NOTE — Consults (Signed)
Consults  by Ardeth Sportsman, NP at 05/08/19 [phone]                Author: Ardeth Sportsman, NP  Service: Gastroenterology  Author Type: Nurse Practitioner       Filed: 05/08/19 1854  Date of Service: 05/08/19 0849  Status: Attested Addendum          Editor: Ardeth Sportsman, NP (Nurse Practitioner)       Related Notes: Original Note by Ardeth Sportsman, NP (Nurse Practitioner) filed at 05/08/19  1853          Cosigner: Jackalyn Lombard, MD at 05/11/19 1018          Attestation signed by Jackalyn Lombard, MD at 05/11/19 1018          Patient was seen and examined.       Agree with assessment and plan as per NP note.                                              Gastroenterology Consult          Patient: [name] [name]  MRN: [id number]   SSN: [id number]          Date of Birth: 1951/05/24   Age: 68 y.o.   Sex: female           Assessment:     --Nausea, Vomiting and Diarrhea with associated Abdominal pain suspect gastroenteritis/ infectious source. Other Ddx: inflammatory vs ischemic source vs adhesive disease from prior  abdominal surgery    -CT 05/07/2019 reveals colonic diverticuli.  Terminal ileum, appendix, stomach and small bowel loops are within normal limits.   --Splenic hypodensities 1.6 to 1.1 cm on CT unchanged from prior study   --Hx of Foreign Body in the Sigmoid Colon s/p Colonoscopy 08/16/2014 noting a large bone wedged in the sigmoid colon wall-removed. No evidence of perforation. 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 S/p 12wk Harvoni treatment per Dr. Conley Canal. SVR achieved. Undetectable viral  load week 4 and at conclusion of treatment    -Liver unremarkable on CT. 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 Allen [facility] at [facility]. No stent placed, but biliary brushings performed-Cytology was negative  for malignancy   --COPD Exacerbation per  Primary. COVID-19 negative    --Acute Hypoxic Respiratory Failure, now maintained on NC O2 at 2L    --Hypertension    --Diabetes Mellitus   --Chronic pain syndrome due to Hip pain s/p right hip replacement     Plan:     --Monitor CBC, BMP   --Will order Stool studies, and HCV RNA PCR    --Start Protonix 40mg  every day for now    --Continue Antiemetics PRN N/V   --Full liquid diet for now, Advance diet as tolerated in am    --Continue conservative management    --More recommendations pending above results and pts clinical condition      Subjective:         [name] [name] is a 69 y.o. female who is being seen for nausea, vomiting with associated abdominal pain and diarrhea.  The patient states  her symptoms began approximately 48 hours ago and seemed to  progressively worsen.  She reports experiencing abdominal pain more prominent to her lower abdomen rating 7-8 out of 10.  She also states that she feels nauseated currently but reports her last  episode of vomiting just prior to hospitalization consisting of undigested food.  She denies hematemesis.  She denies change in appetite or weight loss.  She reports her normal bowel pattern to consist of daily soft brown color stools but states over  the last 3 days she has had upwards of 3-4 BMs per day with her last BM last night.  She denies hematochezia or melena.  Patient also reported experiencing a cough and increasing shortness of breath at the time of admission which she states currently  has improved.  She denies chest pain, dizziness, fever but does report experiencing chills.  She denies starting any new medications, NSAIDs or anticoagulants.  Most recent endoscopic evaluation as noted above.  She denies any known family history of  chronic GI illnesses or malignancies.  She denies any other complaints at this time.         Hospital Problems   Date Reviewed:  08/08/2018                         Codes  Class  Noted  POA              COPD exacerbation (HCC)  ICD-10-CM:  J44.1   ICD-9-CM: 491.21    05/07/2019  Unknown                        Acute hypoxemic respiratory failure [facility])  ICD-10-CM: J96.01   ICD-9-CM: 518.81    05/07/2019  Unknown                         Past Medical History:        Diagnosis  Date         ?  Arthritis       ?  Chronic pain syndrome            related to R hip replacement         ?  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  DM2 (diabetes mellitus, type 2) (HCC)       ?  GERD (gastroesophageal reflux disease)       ?  Hepatitis C            HEP C         ?  HTN (hypertension)       ?  Lung nodule, multiple            (CT 10/2016) Small left upper lobe and 1.7 x 1.6 cm left lower lobe nodules not significantly changed from 07/23/2016 and 03/22/2016         ?  Marijuana use       ?  PTSD (post-traumatic stress disorder)            lived through the [place] in [place]         ?  Thoracic ascending aortic aneurysm (HCC)            4.4cm noted on CT Chest (10/2016)         ?  Thyroid nodule  2.5 cm stable right thyroid nodule (CT 10/2016)          Past Surgical History:         Procedure  Laterality  Date          ?  HX ENDOSCOPY         ?  HX HERNIA REPAIR    2019          x2          ?  HX HIP REPLACEMENT  Right  01/29/2015          ?  HX HIP REPLACEMENT              ?  HX HYSTERECTOMY    2010          hysterectomy           Family History         Problem  Relation  Age of Onset          ?  Hypertension  Mother       ?  Other  Mother                OSA          ?  Cancer  Father                suspected leukemia per pt's spouse          ?  Cancer  Maternal Aunt            ?  Alcohol abuse  Maternal Grandfather            Social History          Tobacco Use         ?  Smoking status:  Former Smoker     ?  Smokeless tobacco:  Never Used        ?  Tobacco comment: reported as quit smoking around 1990s per spouse       Substance Use Topics         ?  Alcohol use:  No            Current Facility-Administered Medications             Medication  Dose  Route  Frequency  Provider  Last Rate  Last Dose              ?  cetirizine (ZYRTEC) tablet 10 mg   10 mg  Oral  DAILY  Roderic Scarce, MD           ?  cloNIDine HCL (CATAPRES) tablet 0.3 mg   0.3 mg  Oral  BID  Roderic Scarce, MD     0.3 mg at 05/08/19 0458     ?  dilTIAZem ER (CARDIZEM CD) capsule 240 mg   240 mg  Oral  DAILY  Roderic Scarce, MD           ?  fluticasone-vilanterol [facility] ELLIPTA) 126mcg-25mcg/puff   1 Puff  Inhalation  DAILY  Roderic Scarce, MD           ?  gabapentin (NEURONTIN) capsule 300 mg   300 mg  Oral  TID  Roderic Scarce, MD     300 mg at 05/07/19 2243     ?  guaiFENesin ER (MUCINEX) tablet 600 mg   600 mg  Oral  Q12H  Roderic Scarce, MD     600 mg at 05/07/19 2243     ?  losartan (COZAAR) tablet 50 mg   50 mg  Oral  DAILY  Roderic Scarce, MD           ?  methocarbamoL (ROBAXIN) tablet 500 mg   500 mg  Oral  QID  Roderic Scarce, MD     500 mg at 05/07/19 2243     ?  montelukast (SINGULAIR) tablet 10 mg   10 mg  Oral  QHS  Roderic Scarce, MD     10 mg at 05/07/19 2243              ?  rosuvastatin (CRESTOR) tablet 20 mg   20 mg  Oral  QHS  Roderic Scarce, MD     20 mg at 05/07/19 2242              ?  spironolactone (ALDACTONE) tablet 100 mg   100 mg  Oral  DAILY  Roderic Scarce, MD           ?  tiotropium bromide (SPIRIVA RESPIMAT) 2.5 mcg /actuation   1 Puff  Inhalation  DAILY  Roderic Scarce, MD           ?  traZODone (DESYREL) tablet 50 mg   50 mg  Oral  QHS PRN  Roderic Scarce, MD     50 mg at 05/08/19 0008     ?  sodium chloride (NS) flush 5-10 mL   5-10 mL  IntraVENous  Q8H  Roderic Scarce, MD     10 mL at 05/08/19 1610     ?  sodium chloride (NS) flush 5-10 mL   5-10 mL  IntraVENous  PRN  Roderic Scarce, MD           ?  naloxone [facility]) injection 0.1 mg   0.1 mg  IntraVENous  PRN  Roderic Scarce, MD           ?  acetaminophen (TYLENOL) tablet 650 mg   650 mg  Oral  Q6H PRN  Roderic Scarce, MD     650 mg at  05/08/19 0601     ?  HYDROcodone-acetaminophen (NORCO) 5-325 mg per tablet 1 Tab   1 Tab  Oral  Q4H PRN  Roderic Scarce, MD     1 Tab at 05/08/19 0405     ?  morphine injection 1 mg   1 mg  IntraVENous  Q4H PRN  Roderic Scarce, MD     1 mg at 05/08/19 0602     ?  alum-mag hydroxide-simeth (MYLANTA) oral suspension 30 mL   30 mL  Oral  Q4H PRN  Roderic Scarce, MD           ?  albuterol (PROVENTIL VENTOLIN) nebulizer solution 2.5 mg   2.5 mg  Nebulization  Q6H PRN  Roderic Scarce, MD           ?  heparin (porcine) injection 5,000 Units   5,000 Units  SubCUTAneous  Q8H  Roderic Scarce, MD     5,000 Units at 05/08/19 [phone]     ?  methylPREDNISolone (PF) (SOLU-MEDROL) injection 40 mg   40 mg  IntraVENous  Q8H  Roderic Scarce, MD     40 mg at 05/08/19 0326     ?  hydrALAZINE (APRESOLINE) 20 mg/mL injection 10 mg   10 mg  IntraVENous  Q6H PRN  Roderic Scarce, MD     10 mg at 05/08/19 0600     ?  dextrose (D50) infusion 5-25 g  10-50 mL  IntraVENous  PRN  Roderic Scarce, MD           ?  glucagon (GLUCAGEN) injection 1 mg   1 mg  IntraMUSCular  PRN  Roderic Scarce, MD           ?  insulin glargine (LANTUS) injection 1-100 Units   1-100 Units  SubCUTAneous  QHS  Roderic Scarce, MD     14 Units at 05/07/19 2244     ?  insulin lispro (HUMALOG) injection 1-100 Units   1-100 Units  SubCUTAneous  AC&HS  Roderic Scarce, MD     1 Units at 05/07/19 2243              ?  insulin lispro (HUMALOG) injection 1-100 Units   1-100 Units  SubCUTAneous  PRN  Roderic Scarce, MD                 Allergies        Allergen  Reactions         ?  Chocolate [Cocoa]  Sneezing             Confirms allergy but reports "I still eat it"        Review of Systems:   A 12 point review of systems were obtained with pertinent positives noted in above HPI, otherwise negative         Objective:        Patient Vitals for the past 24 hrs:            Temp  Pulse  Resp  BP  SpO2            05/08/19 0847  (!) 96.1 ??F (35.6 ??C)  (!) 123  26  (!) 193/118  94 %             05/08/19 0655  98 ??F (36.7 ??C)  (!) 121  --  (!) 143/112  --     05/08/19 0554  --  (!) 129  --  (!) 202/131  100 %     05/08/19 0400  --  --  --  (!) 181/132  --     05/08/19 0400  99.7 ??F (37.6 ??C)  (!) 124  20  (!) 181/123  96 %     05/08/19 0027  --  (!) 117  --  (!) 144/128  95 %     05/07/19 2359  --  --  --  (!) 196/137  --     05/07/19 2359  98.4 ??F (36.9 ??C)  (!) 58  22  (!) 200/150  94 %     05/07/19 2158  99 ??F (37.2 ??C)  (!) 110  20  (!) 167/103  96 %     05/07/19 2032  --  97  19  (!) 160/109  99 %     05/07/19 1947  --  (!) 126  --  (!) 153/104  96 %     05/07/19 1902  --  86  17  (!) 150/96  99 %     05/07/19 1900  --  --  --  --  97 %     05/07/19 1755  --  --  --  --  90 %     05/07/19 1632  98.5 ??F (36.9 ??C)  86  --  (!) 159/92  95 %     05/07/19 1555  --  (!) 115  26  (!) 136/107  95 %     05/07/19 1516  98.8 ??F (37.1 ??C)  (!) 102  19  (!) 154/103  100 %     05/07/19 1433  --  --  --  --  99 %     05/07/19 1353  --  --  --  (!) 167/97  --     05/07/19 1337  98.3 ??F (36.8 ??C)  --  --  --  --     05/07/19 1332  --  (!) 114  30  --  94 %     05/07/19 1331  --  (!) 119  30  (!) 204/144  --            05/07/19 1330  --  (!) 119  (!) 33  --  (!) 89 %        Physical Exam:   GENERAL: alert, pleasant, cooperative, no distress, appears  stated age   EYE: No scleral icterus noted    LUNG: Symmetric chest expansion, diminished breath sounds, no wheeze, rhonchi or rales   HEART: Tachycardic   ABDOMEN: soft, obese, diffuse tenderness appreciated on palpation more prominent across lower abdomen, no guarding, b owel sounds active. No palpable  masses, hernias or organomegaly appreciated    EXTREMITIES: no edema   SKIN: no rash or abnormalities   NEUROLOGIC: AOx3      Diagnostic Imaging:   Xr Chest Sngl V      Result Date: 05/07/2019   EXAM:  Chest AP INDICATIONS: SOB, hypoxia COMPARISON: Most recently 03/13/2019. FINDINGS: There is an unchanged density in the left midlung zone just cranial to the known left  lower lobe nodule. This was shown to be atelectasis on previous CT. The nodule  is also unchanged in size. No new abnormalities are prominent bullous disease at the left lung apex. Normal cardiomediastinal contours.       IMPRESSION: 1.  No acute cardiopulmonary process. 2.  Stable atelectasis and nodule in the left midlung zone. 3.  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       Ct Abd Pelv W Cont      Result Date: 05/07/2019   DICOM format image data is available to non-affiliated external healthcare facilities or entities on a secure, media free, reciprocally searchable basis with patient authorization for 12 months following the date of the study. Clinical history: Left lower  quadrant pain, elevated blood blood cells EXAMINATION: CT scan of the abdomen and pelvis with intravenous contrast 05/07/2019. 5 mm spiral scanning is performed from the costophrenic angles to the symphysis pubis. Coronal and sagittal reconstruction imaging  has been obtained. Correlation: 05/11/2018 and 02/22/2018 FINDINGS: There is respiratory motion artifact. There are bullous changes lung bases. Osteopenia and degenerative changes of the spine. Right hip prosthesis. Liver, gallbladder, spleen, adrenal  glands are unremarkable. 1.6 and 1.1 cm splenic hypodensities unchanged from prior study. 2 mm nonobstructing right nephrolithiasis. Left kidney and bladder are unremarkable. Uterus is absent. There are colonic diverticuli. Terminal ileum, appendix, stomach  and small bowel loops are within normal limits. Atherosclerotic calcification of the abdominal aorta. No dominant lymph node enlargement or free fluid.       IMPRESSION: 1. 2 mm nonobstructing right nephrolithiasis. 2. Small stable right renal cyst. 3. Diverticulosis. 4. Hysterectomy. 5. 1.6 and 1.1 cm stable splenic lesions.       Laboratory Results:         Labs:  Results:  Chemistry  Recent Labs         05/08/19   0000  05/07/19   1355      GLU  191*  159*      NA   138  136      K  5.0  4.3      CL  103  105      CO2  23  26      BUN  22  24      CREA  1.1  1.2      CA  10.0  9.8      AGAP  12  6      AP   --   108      TP   --   9.2*      ALB   --   4.2          Estimated Creatinine Clearance: 60 mL/min (based on SCr of 1.1 mg/dL).     CBC w/Diff  Recent Labs         05/08/19   0000  05/07/19   1355      WBC  21.1*  21.1*      RBC  5.20  4.91      HGB  15.1  14.8      HCT  46.4  43.7      PLT  331  303      GRANS  93.5*  76.9*      LYMPH  4.6*  17.0*      EOS  0.0  0.5              Cardiac Enzymes  No results for input(s): CPK, CKND1, MYO in the last 72 hours.      No lab exists for component: CKRMB, TROIP     Coagulation  No results for input(s): PTP, INR, APTT, INREXT in the last 72 hours.         Hepatitis Panel  Lab Results      Component  Value  Date/Time        Hepatitis B surface Ag  NON-REACTIVE  09/08/2017 01:55 PM        Hepatitis B surface Ab  NON-REACTIVE  09/08/2017 01:55 PM            Recent Labs         05/07/19   1355      ALT  16      TBILI  0.5      AP  108      ALB  4.2      TP  9.2*                       Amylase Lipase          Liver Enzymes  Recent Labs         05/07/19   1355      TP  9.2*      ALB  4.2      AP  108      ALT  16                 Thyroid Studies  No results for input(s): T4, T3U, TSH, TSHEXT in the last 72 hours.      No lab exists for component: T3RU          Pathology  pathology  Signed By:  Myrla Halsted MS, APRN, FNP-C       May 08, 2019           Office: [phone]

## 2019-05-08 NOTE — Progress Notes (Signed)
Progress  Notes by Holland Falling, MD at 05/08/19 [phone]                Author: Holland Falling, MD  Service: Hospitalist  Author Type: Physician       Filed: 05/08/19 1554  Date of Service: 05/08/19 0750  Status: Signed          Editor: Holland Falling, MD (Physician)                       INTERNAL MEDICINE PROGRESS NOTE      Date of note:      May 08, 2019   Patient:               [name] [name] , 68 y.o., female   Admit Date:        05/07/2019   Length of Stay:  1 day(s)        Overnight/24-hour Events:      Reviewed.     Subjective:       abdominal pain more on left side     Assessment:             1.  Acute hypoxic respiratory failure - presumed COPD exacerbation   2.  Abdominal pain - ddx: gastroenteritis, ?colitis, ?ischemic etiology, CT A/P with contrast: 2 mm nonobstructing right nephrolithiasis.  Small stable right renal cyst.  Diverticulosis.  Hysterectomy. 1.6 and 1.1 cm stable splenic lesion with no acute pathology  noted   3.  Hypertension   4.  Type 2 DM   5.  Hyperlipidemia   6.  Peripheral neuropathy   7.  Left lower lobe nodule   8.  History of tobacco abuse   9.  Pulmonary nodule   10.  2 mm nonobstructing right nephrolithiasis     Plan:             ??  Continue breo, albuterol prn   ??  Stop solumedrol per recommendations by Pulm   ??  Titrate oxygen to maintain saturation above 90%   ??  Continue singulair, cardizem, trazodone spiriva   ??  glucommander protocol   ??  Pulmonary on board appreciate recommendations   ??  GI consulted for abdominal pain   ??  Pain control, antiemetics   ??  Stool studies pending   ??  If pain has not improved may consider repeat CT scan tomorrow am    ??  Start IV fluids      Diet: diabetic   DVT ppx: heparin     Total clinical care time is 35 minutes, including review of chart including all labs, radiology, past medical history, and discussion with patient and  family. Greater than 50% of my time was spent in coordination of care and counseling          Disposition:                  hospitalization     Objective:     BP (!) 143/112 (BP 1 Location: Right arm) Comment: recheck after interventions   Pulse (!) 121    Temp 98 ??F (36.7 ??C)    Resp 20    Ht 5' 8" (1.727 m)    Wt 93.2 kg (205  lb 7.5 oz)    SpO2 100%    Breastfeeding No    BMI 31.24 kg/m??       General: alert oriented in mild distress from pain   HEENT: normocephalic,  atraumatic   Lungs: clear to auscultation   Heart: regular rate and rhythm   Abdomen: LLQ tenderness   Extremities: no edema   Skin: texture turgor normal   Neuro: no FNDs     Current Medications:        Current Facility-Administered Medications:    ?  cetirizine (ZYRTEC) tablet 10 mg, 10 mg, Oral, DAILY, Charlaine Dalton, MD   ?  cloNIDine HCL (CATAPRES) tablet 0.3 mg, 0.3 mg, Oral, BID, Charlaine Dalton, MD, 0.3 mg at 05/08/19 0458   ?  dilTIAZem ER (CARDIZEM CD) capsule 240 mg, 240 mg, Oral, DAILY, Charlaine Dalton, MD   ?  fluticasone-vilanterol (BREO ELLIPTA) 167mg-25mcg/puff, 1 Puff, Inhalation, DAILY, VCharlaine Dalton MD   ?  gabapentin (NEURONTIN) capsule 300 mg, 300 mg, Oral, TID, VCharlaine Dalton MD, 300 mg at 05/07/19 2243   ?  guaiFENesin ER (MUCINEX) tablet 600 mg, 600 mg, Oral, Q12H, VCharlaine Dalton MD, 600 mg at 05/07/19 2243   ?  losartan (COZAAR) tablet 50 mg, 50 mg, Oral, DAILY, VCharlaine Dalton MD   ?  methocarbamoL (ROBAXIN) tablet 500 mg, 500 mg, Oral, QID, VCharlaine Dalton MD, 500 mg at 05/07/19 2243   ?  montelukast (SINGULAIR) tablet 10 mg, 10 mg, Oral, QHS, VCharlaine Dalton MD, 10 mg at 05/07/19 2243   ?  rosuvastatin (CRESTOR) tablet 20 mg, 20 mg, Oral, QHS, VCharlaine Dalton MD, 20 mg at 05/07/19 2242   ?  spironolactone (ALDACTONE) tablet 100 mg, 100 mg, Oral, DAILY, VCharlaine Dalton MD   ?  tiotropium bromide (SPIRIVA RESPIMAT) 2.5 mcg /[place] Inhalation, DAILY, VCharlaine Dalton MD   ?  traZODone (DESYREL) tablet 50 mg, 50 mg, Oral, QHS PRN, VCharlaine Dalton MD, 50 mg at 05/08/19 0008   ?  sodium chloride (NS) flush 5-10 mL, 5-10 mL,  IntraVENous, Q8H, VCharlaine Dalton MD, 10 mL at 05/08/19 01610  ?  sodium chloride (NS) flush 5-10 mL, 5-10 mL, IntraVENous, PRN, VCharlaine Dalton MD   ?  naloxone ([facility] injection 0.1 mg, 0.1 mg, IntraVENous, PRN, VCharlaine Dalton MD   ?  acetaminophen (TYLENOL) tablet 650 mg, 650 mg, Oral, Q6H PRN, VCharlaine Dalton MD, 650 mg at 05/08/19 0601   ?  HYDROcodone-acetaminophen (NORCO) 5-325 mg per tablet 1 Tab, 1 Tab, Oral, Q4H PRN, VCharlaine Dalton MD, 1 Tab at 05/08/19 0405   ?  morphine injection 1 mg, 1 mg, IntraVENous, Q4H PRN, VCharlaine Dalton MD, 1 mg at 05/08/19 0602   ?  alum-mag hydroxide-simeth (MYLANTA) oral suspension 30 mL, 30 mL, Oral, Q4H PRN, VCharlaine Dalton MD   ?  albuterol (PROVENTIL VENTOLIN) nebulizer solution 2.5 mg, 2.5 mg, Nebulization, Q6H PRN, VCharlaine Dalton MD   ?  heparin (porcine) injection 5,000 Units, 5,000 Units, SubCUTAneous, Q8H, VCharlaine Dalton MD, 5,000 Units at 05/08/19 [id number]  ?  methylPREDNISolone (PF) (SOLU-MEDROL) injection 40 mg, 40 mg, IntraVENous, Q8H, VCharlaine Dalton MD, 40 mg at 05/08/19 0326   ?  hydrALAZINE (APRESOLINE) 20 mg/mL injection 10 mg, 10 mg, IntraVENous, Q6H PRN, VCharlaine Dalton MD, 10 mg at 05/08/19 0600   ?  dextrose (D50) infusion 5-25 g, 10-50 mL, IntraVENous, PRN, VCharlaine Dalton MD   ?  glucagon (GLUCAGEN) injection 1 mg, 1 mg, IntraMUSCular, PRN, VCharlaine Dalton MD   ?  insulin glargine (LANTUS) injection 1-100 Units, 1-100 Units, SubCUTAneous, QHS, VCharlaine Dalton MD, 14 Units at 05/07/19 2244   ?  insulin lispro (HUMALOG) injection 1-100 Units, 1-100 Units, SubCUTAneous, AC&HS, VCharlaine Dalton  MD, 1 Units at 05/07/19 2243   ?  insulin lispro (HUMALOG) injection 1-100 Units, 1-100 Units, SubCUTAneous, PRN, Charlaine Dalton, MD     Labs:          Recent Results (from the past 24 hour(s))     CBC WITH AUTOMATED DIFF          Collection Time: 05/07/19  1:55 PM         Result  Value  Ref Range            WBC  21.1 (H)  4.0 - 11.0 1000/mm3       RBC  4.91  3.60  - 5.20 M/uL       HGB  14.8  13.0 - 17.2 gm/dl       HCT  43.7  37.0 - 50.0 %       MCV  89.0  80.0 - 98.0 fL       MCH  30.1  25.4 - 34.6 pg       MCHC  33.9  30.0 - 36.0 gm/dl       PLATELET  303  140 - 450 1000/mm3       MPV  11.0 (H)  6.0 - 10.0 fL       RDW-SD  43.6  36.4 - 46.3         NRBC  0  0 - 0         IMMATURE GRANULOCYTES  0.4  0.0 - 3.0 %       NEUTROPHILS  76.9 (H)  34 - 64 %       LYMPHOCYTES  17.0 (L)  28 - 48 %       MONOCYTES  4.9  1 - 13 %       EOSINOPHILS  0.5  0 - 5 %       BASOPHILS  0.3  0 - 3 %       METABOLIC PANEL, COMPREHENSIVE          Collection Time: 05/07/19  1:55 PM         Result  Value  Ref Range            Sodium  136  136 - 145 mEq/L       Potassium  4.3  3.5 - 5.1 mEq/L       Chloride  105  98 - 107 mEq/L       CO2  26  21 - 32 mEq/L       Glucose  159 (H)  74 - 106 mg/dl       BUN  24  7 - 25 mg/dl       Creatinine  1.2  0.6 - 1.3 mg/dl       GFR est AA  58.0          GFR est non-AA  48               Calcium  9.8  8.5 - 10.1 mg/dl            AST (SGOT)  7 (L)  15 - 37 U/L       ALT (SGPT)  16  12 - 78 U/L       Alk. phosphatase  108  45 - 117 U/L       Bilirubin, total  0.5  0.2 - 1.0 mg/dl       Protein, total  9.2 (H)  6.4 -  8.2 gm/dl       Albumin  4.2  3.4 - 5.0 gm/dl       Anion gap  6  5 - 15 mmol/L       LIPASE          Collection Time: 05/07/19  1:55 PM         Result  Value  Ref Range            Lipase  90  73 - 393 U/L       PROCALCITONIN          Collection Time: 05/07/19  1:55 PM         Result  Value  Ref Range            PROCALCITONIN  <0.05  0.00 - 0.50 ng/ml       POC URINE MACROSCOPIC          Collection Time: 05/07/19  3:46 PM         Result  Value  Ref Range            Glucose  Negative  NEGATIVE,Negative mg/dl       Bilirubin  Small (A)  NEGATIVE,Negative         Ketone  Negative  NEGATIVE,Negative mg/dl       Specific gravity  >=1.030  1.005 - 1.030         Blood  Moderate (A)  NEGATIVE,Negative         pH (UA)  5.0  5 - 9         Protein  >=300 (A)   NEGATIVE,Negative mg/dl       Urobilinogen  0.2  0.0 - 1.0 EU/dl       Nitrites  Negative  NEGATIVE,Negative         Leukocyte Esterase  Negative  NEGATIVE,Negative         Color  Amber          Appearance  Cloudy          COVID-19 (INPATIENT TESTING)          Collection Time: 05/07/19  7:00 PM       Specimen: NASOPHARYNGEAL SWAB         Result  Value  Ref Range            COVID-19  NEGATIVE  NEGATIVE         LACTIC ACID          Collection Time: 05/07/19  8:48 PM         Result  Value  Ref Range            Lactic Acid  1.2  0.4 - 2.0 mmol/L       TROPONIN I          Collection Time: 05/07/19  8:48 PM         Result  Value  Ref Range            Troponin-I  <0.015  0.000 - 0.045 ng/ml       GLUCOSE, POC          Collection Time: 05/07/19  9:57 PM         Result  Value  Ref Range            Glucose (POC)  189 (H)  65 - 105 mg/dL       CBC WITH AUTOMATED DIFF  Collection Time: 05/08/19 12:00 AM         Result  Value  Ref Range            WBC  21.1 (H)  4.0 - 11.0 1000/mm3       RBC  5.20  3.60 - 5.20 M/uL       HGB  15.1  13.0 - 17.2 gm/dl       HCT  46.4  37.0 - 50.0 %       MCV  89.2  80.0 - 98.0 fL       MCH  29.0  25.4 - 34.6 pg       MCHC  32.5  30.0 - 36.0 gm/dl       PLATELET  331  140 - 450 1000/mm3       MPV  11.0 (H)  6.0 - 10.0 fL       RDW-SD  43.8  36.4 - 46.3         NRBC  0  0 - 0         IMMATURE GRANULOCYTES  0.7  0.0 - 3.0 %       NEUTROPHILS  93.5 (H)  34 - 64 %       LYMPHOCYTES  4.6 (L)  28 - 48 %       MONOCYTES  1.1  1 - 13 %       EOSINOPHILS  0.0  0 - 5 %       BASOPHILS  0.1  0 - 3 %       METABOLIC PANEL, BASIC          Collection Time: 05/08/19 12:00 AM         Result  Value  Ref Range            Sodium  138  136 - 145 mEq/L       Potassium  5.0  3.5 - 5.1 mEq/L       Chloride  103  98 - 107 mEq/L       CO2  23  21 - 32 mEq/L       Glucose  191 (H)  74 - 106 mg/dl       BUN  22  7 - 25 mg/dl       Creatinine  1.1  0.6 - 1.3 mg/dl       GFR est AA  >60.0          GFR est non-AA  53           Calcium  10.0  8.5 - 10.1 mg/dl       Anion gap  12  5 - 15 mmol/L       TROPONIN I          Collection Time: 05/08/19 12:00 AM         Result  Value  Ref Range            Troponin-I  <0.015  0.000 - 0.045 ng/ml       TROPONIN I          Collection Time: 05/08/19 12:42 AM         Result  Value  Ref Range            Troponin-I  <0.015  0.000 - 0.045 ng/ml       GLUCOSE, POC          Collection Time: 05/08/19  5:49  AM         Result  Value  Ref Range            Glucose (POC)  168 (H)  65 - 105 mg/dL       LACTIC ACID          Collection Time: 05/08/19  6:51 AM         Result  Value  Ref Range            Lactic Acid  2.2 (H)  0.4 - 2.0 mmol/L       GLUCOSE, POC          Collection Time: 05/08/19  7:44 AM         Result  Value  Ref Range            Glucose (POC)  171 (H)  65 - 105 mg/dL          Radiology     Xr Chest Sngl V      Result Date: 05/07/2019   EXAM:  Chest AP INDICATIONS: SOB, hypoxia COMPARISON: Most recently 03/13/2019. FINDINGS: There is an unchanged density in the left midlung zone just cranial to the known left lower lobe nodule. This was shown to be atelectasis on previous CT. The nodule  is also unchanged in size. No new abnormalities are prominent bullous disease at the left lung apex. Normal cardiomediastinal contours.       IMPRESSION: 1.  No acute cardiopulmonary process. 2.  Stable atelectasis and nodule in the left midlung zone. 3.  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       Ct Abd Pelv W Cont      Result Date: 05/07/2019   DICOM format image data is available to non-affiliated external healthcare facilities or entities on a secure, media free, reciprocally searchable basis with patient authorization for 12 months following the date of the study. Clinical history: Left lower  quadrant pain, elevated blood blood cells EXAMINATION: CT scan of the abdomen and pelvis with intravenous contrast 05/07/2019. 5 mm spiral scanning is performed from the costophrenic angles to the symphysis  pubis. Coronal and sagittal reconstruction imaging  has been obtained. Correlation: 05/11/2018 and 02/22/2018 FINDINGS: There is respiratory motion artifact. There are bullous changes lung bases. Osteopenia and degenerative changes of the spine. Right hip prosthesis. Liver, gallbladder, spleen, adrenal  glands are unremarkable. 1.6 and 1.1 cm splenic hypodensities unchanged from prior study. 2 mm nonobstructing right nephrolithiasis. Left kidney and bladder are unremarkable. Uterus is absent. There are colonic diverticuli. Terminal ileum, appendix, stomach  and small bowel loops are within normal limits. Atherosclerotic calcification of the abdominal aorta. No dominant lymph node enlargement or free fluid.       IMPRESSION: 1. 2 mm nonobstructing right nephrolithiasis. 2. Small stable right renal cyst. 3. Diverticulosis. 4. Hysterectomy. 5. 1.6 and 1.1 cm stable splenic lesions.           Portions of this electronic record were dictated using Systems analyst. Unintended errors in translation may occur.         Holland Falling, MD   [facility]   May 08, 2019   7:50 AM

## 2019-05-08 NOTE — Progress Notes (Signed)
Repeat VS charted and Repeat EKG completed.

## 2019-05-08 NOTE — Progress Notes (Signed)
[id number]  PAGER ID: [phone]  MESSAGE: [id number]- [name] [name] 68 yr female. Dx: CHF exacerbation. BP 181/132 rechecked. Thanks [phone]      0419  MD states to give 0900 clonidine dose now.

## 2019-05-08 NOTE — Progress Notes (Signed)
 [id number] RN rounded on patient. Patient was in distress. Labored breathing but lungs sound clear/dimished, patient has non-productive cough, stating she feels short of beathe. Patient states,  I have pain all over, I have chest pain, I feel like I'm going to die. RN called for Help. RN took patient initial VS BP 202/131, HR 130, T 100.8 orally, O2 stats 94% on 2L. Blood sugar 168. RN titrated O2 until patient stats increased to 98-100%. Nursing Staff arrived with EKG and emergent equipment.  RRT staff came to bedside. Verbal orders to give 1 mg of ordered prn morphine, 650mg  ordered prn tylenol , and 10 mg of IV Hydralazine early. Hospitalist paged and informed of events.    0600  Hospitalist on call, MD Matriano states to give patient an additional dose of 2 mg of morphine after 30 minutes is patient continues to complain of pain. MD continues to say if 30 minutes after 2 mg or morphine administration patient still has pain then to contact hospitalist.    [phone]  RN Will continue to monitor. Repeat VS:  BP 139/113  O2 96 2L  HR 125  T 98.0 axiallary

## 2019-05-09 LAB — BASIC METABOLIC PANEL
Anion Gap: 8 mmol/L (ref 5–15)
BUN: 37 mg/dl — ABNORMAL HIGH (ref 7–25)
CO2: 26 mEq/L (ref 21–32)
Calcium: 9.7 mg/dl (ref 8.5–10.1)
Chloride: 101 mEq/L (ref 98–107)
Creatinine: 1.4 mg/dl — ABNORMAL HIGH (ref 0.6–1.3)
EGFR IF NonAfrican American: 40
GFR African American: 48
Glucose: 156 mg/dl — ABNORMAL HIGH (ref 74–106)
Potassium: 4.8 mEq/L (ref 3.5–5.1)
Sodium: 135 mEq/L — ABNORMAL LOW (ref 136–145)

## 2019-05-09 LAB — CBC WITH AUTO DIFFERENTIAL
Basophils %: 0.1 % (ref 0–3)
Eosinophils %: 0 % (ref 0–5)
Hematocrit: 41.7 % (ref 37.0–50.0)
Hemoglobin: 13.9 gm/dl (ref 13.0–17.2)
Immature Granulocytes: 0.6 % (ref 0.0–3.0)
Lymphocytes %: 9.7 % — ABNORMAL LOW (ref 28–48)
MCH: 29.9 pg (ref 25.4–34.6)
MCHC: 33.3 gm/dl (ref 30.0–36.0)
MCV: 89.7 fL (ref 80.0–98.0)
MPV: 11.1 fL — ABNORMAL HIGH (ref 6.0–10.0)
Monocytes %: 11.9 % (ref 1–13)
Neutrophils %: 77.7 % — ABNORMAL HIGH (ref 34–64)
Nucleated RBCs: 0 (ref 0–0)
Platelets: 247 10*3/uL (ref 140–450)
RBC: 4.65 M/uL (ref 3.60–5.20)
RDW-SD: 45.1 (ref 36.4–46.3)
WBC: 12.8 10*3/uL — ABNORMAL HIGH (ref 4.0–11.0)

## 2019-05-09 LAB — POCT GLUCOSE
POC Glucose: 181 mg/dL — ABNORMAL HIGH (ref 65–105)
POC Glucose: 211 mg/dL — ABNORMAL HIGH (ref 65–105)
POC Glucose: 109 mg/dL — ABNORMAL HIGH (ref 65–105)
POC Glucose: 128 mg/dL — ABNORMAL HIGH (ref 65–105)

## 2019-05-09 LAB — LACTIC ACID
LACTIC ACID: 1 mmol/L (ref 0.4–2.0)
Lactic Acid: 1 mmol/L (ref 0.4–2.0)

## 2019-05-09 LAB — METABOLIC PANEL, BASIC
Anion gap: 8 mmol/L (ref 5–15)
BUN: 37 mg/dl — ABNORMAL HIGH (ref 7–25)
CO2: 26 mEq/L (ref 21–32)
Calcium: 9.7 mg/dl (ref 8.5–10.1)
Chloride: 101 mEq/L (ref 98–107)
Creatinine: 1.4 mg/dl — ABNORMAL HIGH (ref 0.6–1.3)
GFR est AA: 48
GFR est non-AA: 40
Glucose: 156 mg/dl — ABNORMAL HIGH (ref 74–106)
Potassium: 4.8 mEq/L (ref 3.5–5.1)
Sodium: 135 mEq/L — ABNORMAL LOW (ref 136–145)

## 2019-05-09 LAB — CBC WITH AUTOMATED DIFF
BASOPHILS: 0.1 % (ref 0–3)
EOSINOPHILS: 0 % (ref 0–5)
HCT: 41.7 % (ref 37.0–50.0)
HGB: 13.9 gm/dl (ref 13.0–17.2)
IMMATURE GRANULOCYTES: 0.6 % (ref 0.0–3.0)
LYMPHOCYTES: 9.7 % — ABNORMAL LOW (ref 28–48)
MCH: 29.9 pg (ref 25.4–34.6)
MCHC: 33.3 gm/dl (ref 30.0–36.0)
MCV: 89.7 fL (ref 80.0–98.0)
MONOCYTES: 11.9 % (ref 1–13)
MPV: 11.1 fL — ABNORMAL HIGH (ref 6.0–10.0)
NEUTROPHILS: 77.7 % — ABNORMAL HIGH (ref 34–64)
NRBC: 0 (ref 0–0)
PLATELET: 247 10*3/uL (ref 140–450)
RBC: 4.65 M/uL (ref 3.60–5.20)
RDW-SD: 45.1 (ref 36.4–46.3)
WBC: 12.8 10*3/uL — ABNORMAL HIGH (ref 4.0–11.0)

## 2019-05-09 LAB — GLUCOSE, POC
Glucose (POC): 181 mg/dL — ABNORMAL HIGH (ref 65–105)
Glucose (POC): 211 mg/dL — ABNORMAL HIGH (ref 65–105)
Glucose (POC): 109 mg/dL — ABNORMAL HIGH (ref 65–105)
Glucose (POC): 128 mg/dL — ABNORMAL HIGH (ref 65–105)

## 2019-05-09 MED FILL — GABAPENTIN 300 MG CAP: 300 mg | ORAL | Qty: 1

## 2019-05-09 MED FILL — MORPHINE 2 MG/ML INJECTION: 2 mg/mL | INTRAMUSCULAR | Qty: 1

## 2019-05-09 MED FILL — METHOCARBAMOL 500 MG TAB: 500 mg | ORAL | Qty: 1

## 2019-05-09 MED FILL — DILTIAZEM ER 240 MG 24 HR CAP: 240 mg | ORAL | Qty: 1

## 2019-05-09 MED FILL — HYDROCODONE-ACETAMINOPHEN 5 MG-325 MG TAB: 5-325 mg | ORAL | Qty: 2

## 2019-05-09 MED FILL — HEPARIN (PORCINE) 5,000 UNIT/ML IJ SOLN: 5000 unit/mL | INTRAMUSCULAR | Qty: 1

## 2019-05-09 MED FILL — MUCINEX 600 MG TABLET, EXTENDED RELEASE: 600 mg | ORAL | Qty: 1

## 2019-05-09 MED FILL — HYDROCODONE-ACETAMINOPHEN 5 MG-325 MG TAB: 5-325 mg | ORAL | Qty: 1

## 2019-05-09 MED FILL — MONTELUKAST 10 MG TAB: 10 mg | ORAL | Qty: 1

## 2019-05-09 MED FILL — PANTOPRAZOLE 40 MG IV SOLR: 40 mg | INTRAVENOUS | Qty: 40

## 2019-05-09 MED FILL — ROSUVASTATIN 20 MG TAB: 20 mg | ORAL | Qty: 1

## 2019-05-09 MED FILL — SODIUM CHLORIDE 0.9 % IJ SYRG: INTRAMUSCULAR | Qty: 10

## 2019-05-09 MED FILL — CLONIDINE 0.1 MG TAB: 0.1 mg | ORAL | Qty: 1

## 2019-05-09 MED FILL — LOSARTAN 50 MG TAB: 50 mg | ORAL | Qty: 1

## 2019-05-09 MED FILL — TRAZODONE 50 MG TAB: 50 mg | ORAL | Qty: 1

## 2019-05-09 MED FILL — SPIRONOLACTONE 25 MG TAB: 25 mg | ORAL | Qty: 4

## 2019-05-09 MED FILL — CETIRIZINE 5 MG TAB: 5 mg | ORAL | Qty: 2

## 2019-05-09 NOTE — Progress Notes (Signed)
Problem: Falls - Risk of  Goal: *Absence of Falls  Description: Document Schmid Fall Risk and appropriate interventions in the flowsheet.  Outcome: Progressing Towards Goal  Note: Fall Risk Interventions:  Mobility Interventions: Assess mobility with egress test, Bed/chair exit alarm, Patient to call before getting OOB         Medication Interventions: Bed/chair exit alarm, Patient to call before getting OOB                   Problem: Patient Education: Go to Patient Education Activity  Goal: Patient/Family Education  Outcome: Progressing Towards Goal     Problem: Gas Exchange - Impaired  Goal: *Absence of hypoxia  Outcome: Progressing Towards Goal     Problem: Patient Education: Go to Patient Education Activity  Goal: Patient/Family Education  Outcome: Progressing Towards Goal     Problem: Risk for Spread of Infection  Goal: Prevent transmission of infectious organism to others  Description: Prevent the transmission of infectious organisms to other patients, staff members, and visitors.  Outcome: Progressing Towards Goal     Problem: Patient Education:  Go to Education Activity  Goal: Patient/Family Education  Outcome: Progressing Towards Goal

## 2019-05-09 NOTE — Progress Notes (Signed)
INTERNAL MEDICINE PROGRESS NOTE    Date of note:      May 09, 2019  Patient:               [name] [name], 68 y.o., female  Admit Date:        05/07/2019  Length of Stay:  2 day(s)    Overnight/24-hour Events:    Reviewed.  Subjective:     Was able to eat some breakfast this am, feels a little better but while I was examining stated that "my belly is starting to hurt again"  Assessment:           1. Acute hypoxic respiratory failure - presumed COPD exacerbation  2. Abdominal pain - ddx: gastroenteritis, ?colitis, ?ischemic etiology, CT A/P with contrast: 2 mm nonobstructing right nephrolithiasis.  Small stable right renal cyst.  Diverticulosis.  Hysterectomy. 1.6 and 1.1 cm stable splenic lesion with no acute pathology noted  3. Hypertension  4. Type 2 DM  5. Hyperlipidemia  6. Peripheral neuropathy  7. Left lower lobe nodule  8. History of tobacco abuse  9. Pulmonary nodule  10. Bullous emphysema  11. 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 GERD  13. 2 mm nonobstructing right nephrolithiasis  Plan:           ? Continue breo, albuterol prn  ? Titrate oxygen to maintain saturation above 90%  ? Continue singulair, cardizem, trazodone, spiriva  ? glucommander protocol  ? Pulmonary on board appreciate recommendations  ? GI consulted for abdominal pain - conservative management for now  ? Stool studies pending  ? HCV RNA pending  ? Continue protonix   ? patient has no reported diarrhea since admission  ? Pain control, antiemetics  ? Stop fluids    Diet: diabetic  DVT ppx: heparin    Total clinical care time is 35 minutes, including review of chart including all labs, radiology, past medical history, and discussion with patient and family. Greater than 50% of my time was spent in coordination of care and counseling     Disposition:               hospitalization  Objective:   BP 97/77 (BP 1 Location: Left arm, BP Patient Position: Supine)    Pulse  90    Temp 97.8 ??F (36.6 ??C)    Resp 18    Ht 5\' 8"  (1.727 m)    Wt 93.2 kg (205 lb 7.5 oz)    SpO2 96%    Breastfeeding No    BMI 31.24 kg/m??     General: alert oriented in mild distress from pain  HEENT: normocephalic, atraumatic  Lungs: clear to auscultation  Heart: regular rate and rhythm  Abdomen: LLQ tenderness  Extremities: no edema  Skin: texture turgor normal  Neuro: no FNDs  Current Medications:     Current Facility-Administered Medications:   ???  ondansetron (ZOFRAN) injection 4 mg, 4 mg, IntraVENous, Q4H PRN, Dixon, Chantel D, NP  ???  HYDROcodone-acetaminophen (NORCO) 5-325 mg per tablet 1-2 Tab, 1-2 Tab, Oral, Q4H PRN, Rande BruntKhan, [place], MD, 2 Tab at 05/09/19 1046  ???  0.9% sodium chloride infusion, 75 mL/hr, IntraVENous, CONTINUOUS, Rande BruntKhan, Araseli Sherry, MD, Last Rate: 75 mL/hr at 05/08/19 1842, 75 mL/hr at 05/08/19 1842  ???  pantoprazole (PROTONIX) 40 mg in 0.9% sodium chloride 10 mL injection, 40 mg, IntraVENous, DAILY, Dixon, Chantel D, NP, 40 mg at 05/09/19 0925  ???  cetirizine (ZYRTEC) tablet  10 mg, 10 mg, Oral, DAILY, Charlaine Dalton, MD, 10 mg at 05/09/19 1610  ???  cloNIDine HCL (CATAPRES) tablet 0.3 mg, 0.3 mg, Oral, BID, Charlaine Dalton, MD, 0.3 mg at 05/09/19 9604  ???  dilTIAZem ER (CARDIZEM CD) capsule 240 mg, 240 mg, Oral, DAILY, Charlaine Dalton, MD, 240 mg at 05/09/19 5409  ???  fluticasone-vilanterol (BREO ELLIPTA) 128mcg-25mcg/puff, 1 Puff, Inhalation, DAILY, Charlaine Dalton, MD, 1 Puff at 05/09/19 0945  ???  gabapentin (NEURONTIN) capsule 300 mg, 300 mg, Oral, TID, Charlaine Dalton, MD, 300 mg at 05/09/19 8119  ???  guaiFENesin ER (MUCINEX) tablet 600 mg, 600 mg, Oral, Q12H, Charlaine Dalton, MD, 600 mg at 05/09/19 1478  ???  losartan (COZAAR) tablet 50 mg, 50 mg, Oral, DAILY, Charlaine Dalton, MD, 50 mg at 05/09/19 2956  ???  methocarbamoL (ROBAXIN) tablet 500 mg, 500 mg, Oral, QID, Charlaine Dalton, MD, 500 mg at 05/09/19 1340  ???  montelukast (SINGULAIR) tablet 10 mg, 10 mg, Oral, QHS, Charlaine Dalton,  MD, 10 mg at 05/08/19 2129  ???  rosuvastatin (CRESTOR) tablet 20 mg, 20 mg, Oral, QHS, Charlaine Dalton, MD, 20 mg at 05/08/19 2130  ???  spironolactone (ALDACTONE) tablet 100 mg, 100 mg, Oral, DAILY, Charlaine Dalton, MD, 100 mg at 05/09/19 2130  ???  tiotropium bromide (SPIRIVA RESPIMAT) 2.5 mcg [place], 1 [place], Inhalation, DAILY, Charlaine Dalton, MD, 1 Puff at 05/09/19 0945  ???  traZODone (DESYREL) tablet 50 mg, 50 mg, Oral, QHS PRN, Charlaine Dalton, MD, 50 mg at 05/08/19 2130  ???  sodium chloride (NS) flush 5-10 mL, 5-10 mL, IntraVENous, Q8H, Charlaine Dalton, MD, 10 mL at 05/09/19 1340  ???  sodium chloride (NS) flush 5-10 mL, 5-10 mL, IntraVENous, PRN, Charlaine Dalton, MD  ???  naloxone (NARCAN) injection 0.1 mg, 0.1 mg, IntraVENous, PRN, Charlaine Dalton, MD  ???  acetaminophen (TYLENOL) tablet 650 mg, 650 mg, Oral, Q6H PRN, Charlaine Dalton, MD, 650 mg at 05/08/19 0601  ???  morphine injection 1 mg, 1 mg, IntraVENous, Q4H PRN, Charlaine Dalton, MD, 1 mg at 05/09/19 1340  ???  alum-mag hydroxide-simeth (MYLANTA) oral suspension 30 mL, 30 mL, Oral, Q4H PRN, Charlaine Dalton, MD  ???  albuterol (PROVENTIL VENTOLIN) nebulizer solution 2.5 mg, 2.5 mg, Nebulization, Q6H PRN, Charlaine Dalton, MD  ???  heparin (porcine) injection 5,000 Units, 5,000 Units, SubCUTAneous, Q8H, Charlaine Dalton, MD, 5,000 Units at 05/09/19 8657  ???  hydrALAZINE (APRESOLINE) 20 mg/mL injection 10 mg, 10 mg, IntraVENous, Q6H PRN, Charlaine Dalton, MD, 10 mg at 05/08/19 1108  ???  dextrose (D50) infusion 5-25 g, 10-50 mL, IntraVENous, PRN, Charlaine Dalton, MD  ???  glucagon (GLUCAGEN) injection 1 mg, 1 mg, IntraMUSCular, PRN, Charlaine Dalton, MD  ???  insulin glargine (LANTUS) injection 1-100 Units, 1-100 Units, SubCUTAneous, QHS, Charlaine Dalton, MD, 15 Units at 05/08/19 2130  ???  insulin lispro (HUMALOG) injection 1-100 Units, 1-100 Units, SubCUTAneous, AC&HS, Charlaine Dalton, MD, 4 Units at 05/09/19 1250  ???  insulin lispro (HUMALOG) injection 1-100 Units, 1-100 Units,  SubCUTAneous, PRN, Charlaine Dalton, MD  Labs:     Recent Results (from the past 24 hour(s))   GLUCOSE, POC    Collection Time: 05/08/19  5:45 PM   Result Value Ref Range    Glucose (POC) 171 (H) 65 - 105 mg/dL   GLUCOSE, POC    Collection Time: 05/08/19  9:14 PM   Result Value Ref Range    Glucose (POC) 181 (H) 65 - 105 mg/dL   CBC WITH AUTOMATED  DIFF    Collection Time: 05/09/19  3:41 AM   Result Value Ref Range    WBC 12.8 (H) 4.0 - 11.0 1000/mm3    RBC 4.65 3.60 - 5.20 M/uL    HGB 13.9 13.0 - 17.2 gm/dl    HCT 19.1 47.8 - 29.5 %    MCV 89.7 80.0 - 98.0 fL    MCH 29.9 25.4 - 34.6 pg    MCHC 33.3 30.0 - 36.0 gm/dl    PLATELET 621 308 - 657 1000/mm3    MPV 11.1 (H) 6.0 - 10.0 fL    RDW-SD 45.1 36.4 - 46.3      NRBC 0 0 - 0      IMMATURE GRANULOCYTES 0.6 0.0 - 3.0 %    NEUTROPHILS 77.7 (H) 34 - 64 %    LYMPHOCYTES 9.7 (L) 28 - 48 %    MONOCYTES 11.9 1 - 13 %    EOSINOPHILS 0.0 0 - 5 %    BASOPHILS 0.1 0 - 3 %   METABOLIC PANEL, BASIC    Collection Time: 05/09/19  3:41 AM   Result Value Ref Range    Sodium 135 (L) 136 - 145 mEq/L    Potassium 4.8 3.5 - 5.1 mEq/L    Chloride 101 98 - 107 mEq/L    CO2 26 21 - 32 mEq/L    Glucose 156 (H) 74 - 106 mg/dl    BUN 37 (H) 7 - 25 mg/dl    Creatinine 1.4 (H) 0.6 - 1.3 mg/dl    GFR est AA 84.6      GFR est non-AA 40      Calcium 9.7 8.5 - 10.1 mg/dl    Anion gap 8 5 - 15 mmol/L   LACTIC ACID    Collection Time: 05/09/19  3:41 AM   Result Value Ref Range    Lactic Acid 1.0 0.4 - 2.0 mmol/L   GLUCOSE, POC    Collection Time: 05/09/19  8:03 AM   Result Value Ref Range    Glucose (POC) 211 (H) 65 - 105 mg/dL   GLUCOSE, POC    Collection Time: 05/09/19 12:02 PM   Result Value Ref Range    Glucose (POC) 109 (H) 65 - 105 mg/dL     Radiology   No results found.     Portions of this electronic record were dictated using Conservation officer, historic buildings. Unintended errors in translation may occur.      Rande Brunt, MD  [facility]  May 09, 2019  7:50 AM

## 2019-05-09 NOTE — Progress Notes (Signed)
Gastrointestinal Progress Note    Patient Name: [name] [name]    [id number] Date: 05/09/2019    Admit Date: 05/07/2019    Subjective:     [name] [name] is a 68 y.o. Female who we are seeing b/o Nausea, vomiting and abdominal pain. The patient actually reports feeling somewhat better today.  Her pain is improve and anti-emetics are controlling her nausea. Her abdominal pain has also improved. She is tolerating liquid diet.      Current Facility-Administered Medications   Medication Dose Route Frequency   ??? ondansetron (ZOFRAN) injection 4 mg  4 mg IntraVENous Q4H PRN   ??? HYDROcodone-acetaminophen (NORCO) 5-325 mg per tablet 1-2 Tab  1-2 Tab Oral Q4H PRN   ??? pantoprazole (PROTONIX) 40 mg in 0.9% sodium chloride 10 mL injection  40 mg IntraVENous DAILY   ??? cetirizine (ZYRTEC) tablet 10 mg  10 mg Oral DAILY   ??? cloNIDine HCL (CATAPRES) tablet 0.3 mg  0.3 mg Oral BID   ??? dilTIAZem ER (CARDIZEM CD) capsule 240 mg  240 mg Oral DAILY   ??? fluticasone-vilanterol (BREO ELLIPTA) 160mcg-25mcg/puff  1 Puff Inhalation DAILY   ??? gabapentin (NEURONTIN) capsule 300 mg  300 mg Oral TID   ??? guaiFENesin ER (MUCINEX) tablet 600 mg  600 mg Oral Q12H   ??? losartan (COZAAR) tablet 50 mg  50 mg Oral DAILY   ??? methocarbamoL (ROBAXIN) tablet 500 mg  500 mg Oral QID   ??? montelukast (SINGULAIR) tablet 10 mg  10 mg Oral QHS   ??? rosuvastatin (CRESTOR) tablet 20 mg  20 mg Oral QHS   ??? spironolactone (ALDACTONE) tablet 100 mg  100 mg Oral DAILY   ??? tiotropium bromide (SPIRIVA RESPIMAT) 2.5 mcg /actuation  1 Puff Inhalation DAILY   ??? traZODone (DESYREL) tablet 50 mg  50 mg Oral QHS PRN   ??? sodium chloride (NS) flush 5-10 mL  5-10 mL IntraVENous Q8H   ??? sodium chloride (NS) flush 5-10 mL  5-10 mL IntraVENous PRN   ??? naloxone (NARCAN) injection 0.1 mg  0.1 mg IntraVENous PRN   ??? acetaminophen (TYLENOL) tablet 650 mg  650 mg Oral Q6H PRN   ??? morphine injection 1 mg  1 mg IntraVENous Q4H PRN    ??? alum-mag hydroxide-simeth (MYLANTA) oral suspension 30 mL  30 mL Oral Q4H PRN   ??? albuterol (PROVENTIL VENTOLIN) nebulizer solution 2.5 mg  2.5 mg Nebulization Q6H PRN   ??? heparin (porcine) injection 5,000 Units  5,000 Units SubCUTAneous Q8H   ??? hydrALAZINE (APRESOLINE) 20 mg/mL injection 10 mg  10 mg IntraVENous Q6H PRN   ??? dextrose (D50) infusion 5-25 g  10-50 mL IntraVENous PRN   ??? glucagon (GLUCAGEN) injection 1 mg  1 mg IntraMUSCular PRN   ??? insulin glargine (LANTUS) injection 1-100 Units  1-100 Units SubCUTAneous QHS   ??? insulin lispro (HUMALOG) injection 1-100 Units  1-100 Units SubCUTAneous AC&HS   ??? insulin lispro (HUMALOG) injection 1-100 Units  1-100 Units SubCUTAneous PRN          Objective:     Visit Vitals  BP 101/75 (BP 1 Location: Right arm)   Pulse 96   Temp 98.1 ??F (36.7 ??C)   Resp 18   Ht 5\' 8"  (1.727 m)   Wt 93.2 kg (205 lb 7.5 oz)   SpO2 91%   Breastfeeding No   BMI 31.24 kg/m??       General appearance: alert, cooperative, no distress, appears stated age  Abdomen: soft,  mild-tenderness to deep palpation in the upper abdomen. Periumbilical area. Bowel sounds normal. No masses,  no organomegaly    Data Review:      Labs: Results:       Chemistry Recent Labs     05/09/19  0341 05/08/19  0000 05/07/19  1355   GLU 156* 191* 159*   NA 135* 138 136   K 4.8 5.0 4.3   CL 101 103 105   CO2 26 23 26    BUN 37* 22 24   CREA 1.4* 1.1 1.2   CA 9.7 10.0 9.8   AGAP 8 12 6       CBC w/Diff Recent Labs     05/09/19  0341 05/08/19  0000 05/07/19  1355   WBC 12.8* 21.1* 21.1*   RBC 4.65 5.20 4.91   HGB 13.9 15.1 14.8   HCT 41.7 46.4 43.7   PLT 247 331 303   GRANS 77.7* 93.5* 76.9*   LYMPH 9.7* 4.6* 17.0*   EOS 0.0 0.0 0.5      Coagulation No results for input(s): PTP, INR, APTT, INREXT in the last 72 hours.    Liver Enzymes Recent Labs     05/07/19  1355   TP 9.2*   ALB 4.2   TBILI 0.5   AP 108   ALT 16      Lipase Recent Labs     05/07/19  1355   LPSE 90             Assessment:    1.  Nausea, Vomiting and Diarrhea with associated Abdominal pain suspect gastroenteritis/ infectious source. Other Ddx: inflammatory vs ischemic source vs adhesive disease from prior abdominal surgery---but functional GI distress is likely playing a significant role in her symptoms  2.  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 S/p 12wk Harvoni treatment per Dr. Conley Canal. SVR achieved. Undetectable viral load week 4 and at conclusion of treatment              -Liver unremarkable on CT. LFTs and Bilirubin unremarkable    3.  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  Await results of pending Stool studies, and HCV RNA PCR   2.  continue  Protonix 40mg  every day for now   3.  Continue Antiemetics PRN N/V  4.   Advance diet as tolerated    5.  Continue conservative management --no plans for endoscopic evaluation at this time  6.  More recommendations pending above results and pts clinical condition           Wenda Low. Tollie Eth, MD, [facility], [place]  May 09, 2019  [place]
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
## 2019-05-09 NOTE — Progress Notes (Signed)
Problem: Falls - Risk of  Goal: *Absence of Falls  Description: Document [name] Fall Risk and appropriate interventions in the flowsheet.  Outcome: Progressing Towards Goal  Note: Fall Risk Interventions:  Mobility Interventions: Assess mobility with egress test, Bed/chair exit alarm, Patient to call before getting OOB         Medication Interventions: Bed/chair exit alarm, Patient to call before getting OOB                   Problem: Patient Education: Go to Patient Education Activity  Goal: Patient/Family Education  Outcome: Progressing Towards Goal     Problem: Gas Exchange - Impaired  Goal: *Absence of hypoxia  Outcome: Progressing Towards Goal     Problem: Patient Education: Go to Patient Education Activity  Goal: Patient/Family Education  Outcome: Progressing Towards Goal     Problem: Risk for Spread of Infection  Goal: Prevent transmission of infectious organism to others  Description: Prevent the transmission of infectious organisms to other patients, staff members, and visitors.  Outcome: Progressing Towards Goal     Problem: Patient Education:  Go to Education Activity  Goal: Patient/Family Education  Outcome: Progressing Towards Goal

## 2019-05-09 NOTE — Progress Notes (Signed)
Progress Notes by Alexis Goodellandalides, Aracelli Woloszyn M, MD at 05/09/19 2244                Author: Alexis Goodellandalides, Mason Burleigh M, MD  Service: Gastroenterology  Author Type: Physician       Filed: 05/09/19 2251  Date of Service: 05/09/19 2244  Status: Signed          Editor: Mertice Uffelman, Jari FavreSteven M, MD (Physician)                       Gastrointestinal Progress Note      Patient Name: [name] [name]      Today's Date: 05/09/2019      Admit Date: 05/07/2019        Subjective:        [name] [name] is a 68 y.o. Female who we are seeing b/o Nausea, vomiting and abdominal  pain. The patient actually reports feeling somewhat better today.  Her pain is improve and anti-emetics are controlling her nausea. Her abdominal pain has also improved. She is tolerating liquid diet.           Current Facility-Administered Medications          Medication  Dose  Route  Frequency           ?  ondansetron (ZOFRAN) injection 4 mg   4 mg  IntraVENous  Q4H PRN     ?  HYDROcodone-acetaminophen (NORCO) 5-325 mg per tablet 1-2 Tab   1-2 Tab  Oral  Q4H PRN     ?  pantoprazole (PROTONIX) 40 mg in 0.9% sodium chloride 10 mL injection   40 mg  IntraVENous  DAILY     ?  cetirizine (ZYRTEC) tablet 10 mg   10 mg  Oral  DAILY     ?  cloNIDine HCL (CATAPRES) tablet 0.3 mg   0.3 mg  Oral  BID     ?  dilTIAZem ER (CARDIZEM CD) capsule 240 mg   240 mg  Oral  DAILY     ?  fluticasone-vilanterol (BREO ELLIPTA) 19000mcg-25mcg/puff   1 Puff  Inhalation  DAILY     ?  gabapentin (NEURONTIN) capsule 300 mg   300 mg  Oral  TID     ?  guaiFENesin ER (MUCINEX) tablet 600 mg   600 mg  Oral  Q12H     ?  losartan (COZAAR) tablet 50 mg   50 mg  Oral  DAILY     ?  methocarbamoL (ROBAXIN) tablet 500 mg   500 mg  Oral  QID     ?  montelukast (SINGULAIR) tablet 10 mg   10 mg  Oral  QHS     ?  rosuvastatin (CRESTOR) tablet 20 mg   20 mg  Oral  QHS     ?  spironolactone (ALDACTONE) tablet 100 mg   100 mg  Oral  DAILY     ?  tiotropium bromide (SPIRIVA RESPIMAT) 2.5 mcg /actuation   1 Puff   Inhalation  DAILY     ?  traZODone (DESYREL) tablet 50 mg   50 mg  Oral  QHS PRN     ?  sodium chloride (NS) flush 5-10 mL   5-10 mL  IntraVENous  Q8H     ?  sodium chloride (NS) flush 5-10 mL   5-10 mL  IntraVENous  PRN     ?  naloxone (NARCAN) injection 0.1 mg   0.1 mg  IntraVENous  PRN     ?  acetaminophen (TYLENOL) tablet 650 mg   650 mg  Oral  Q6H PRN     ?  morphine injection 1 mg   1 mg  IntraVENous  Q4H PRN     ?  alum-mag hydroxide-simeth (MYLANTA) oral suspension 30 mL   30 mL  Oral  Q4H PRN     ?  albuterol (PROVENTIL VENTOLIN) nebulizer solution 2.5 mg   2.5 mg  Nebulization  Q6H PRN     ?  heparin (porcine) injection 5,000 Units   5,000 Units  SubCUTAneous  Q8H     ?  hydrALAZINE (APRESOLINE) 20 mg/mL injection 10 mg   10 mg  IntraVENous  Q6H PRN     ?  dextrose (D50) infusion 5-25 g   10-50 mL  IntraVENous  PRN     ?  glucagon (GLUCAGEN) injection 1 mg   1 mg  IntraMUSCular  PRN     ?  insulin glargine (LANTUS) injection 1-100 Units   1-100 Units  SubCUTAneous  QHS     ?  insulin lispro (HUMALOG) injection 1-100 Units   1-100 Units  SubCUTAneous  AC&HS           ?  insulin lispro (HUMALOG) injection 1-100 Units   1-100 Units  SubCUTAneous  PRN                 Objective:        Visit Vitals      BP  101/75 (BP 1 Location: Right arm)     Pulse  96     Temp  98.1 ??F (36.7 ??C)     Resp  18     Ht  5\' 8"  (1.727 m)     Wt  93.2 kg (205 lb 7.5 oz)     SpO2  91%     Breastfeeding  No        BMI  31.24 kg/m??           General appearance: alert, cooperative, no distress, appears stated age   Abdomen: soft, mild-tenderness to deep palpation in the upper abdomen. Periumbilical area. Bowel sounds normal. No masses,  no organomegaly      Data Review:            Labs:  Results:                  Chemistry  Recent Labs         05/09/19   0341  05/08/19   0000  05/07/19   1355      GLU  156*  191*  159*      NA  135*  138  136      K  4.8  5.0  4.3      CL  101  103  105      CO2  26  23  26       BUN  37*  22  24       CREA  1.4*  1.1  1.2      CA  9.7  10.0  9.8      AGAP  8  12  6                  CBC w/Diff  Recent Labs         05/09/19   0341  05/08/19   0000  05/07/19   1355      WBC  12.8*  21.1*  21.1*      RBC  4.65  5.20  4.91      HGB  13.9  15.1  14.8      HCT  41.7  46.4  43.7      PLT  247  331  303      GRANS  77.7*  93.5*  76.9*      LYMPH  9.7*  4.6*  17.0*      EOS  0.0  0.0  0.5                 Coagulation  No results for input(s): PTP, INR, APTT, INREXT in the last 72 hours.         Liver Enzymes  Recent Labs         05/07/19   1355      TP  9.2*      ALB  4.2      TBILI  0.5      AP  108      ALT  16                 Lipase  Recent Labs         05/07/19   1355      LPSE  90                          Assessment:     1.  Nausea, Vomiting and Diarrhea with associated Abdominal pain suspect gastroenteritis/ infectious source. Other Ddx: inflammatory vs ischemic source vs  adhesive disease from prior abdominal surgery---but functional GI distress is likely playing a significant role in her symptoms   2.  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 S/p 12wk Harvoni treatment per Dr. Lendell Caprice. SVR achieved. Undetectable viral load week 4 and at conclusion of treatment               -Liver unremarkable on CT. LFTs and Bilirubin unremarkable     3.  Chronic pain syndrome due to Hip pain s/p right hip replacement     Plan:     1.  Await results of pending Stool studies, and HCV RNA PCR    2.  continue  Protonix 40mg  every day for now    3.  Continue Antiemetics PRN N/V   4.   Advance diet as tolerated     5.  Continue conservative management --no plans for endoscopic evaluation at this time   6.  More recommendations pending above results and pts clinical condition                . Jari Favre, MD, [facility], [facility]   May 09, 2019   May 11, 2019

## 2019-05-09 NOTE — Progress Notes (Signed)
Progress  Notes by Holland Falling, MD at 05/09/19 1623                Author: Holland Falling, MD  Service: Hospitalist  Author Type: Physician       Filed: 05/09/19 1648  Date of Service: 05/09/19 1623  Status: Signed          Editor: Holland Falling, MD (Physician)                       INTERNAL MEDICINE PROGRESS NOTE      Date of note:      May 09, 2019   Patient:               [name] [name] , 68 y.o., female   Admit Date:        05/07/2019   Length of Stay:  2 day(s)        Overnight/24-hour Events:      Reviewed.     Subjective:       Was able to eat some breakfast this am, feels a little better but while I was examining stated that "my belly is starting to hurt again"     Assessment:             1.  Acute hypoxic respiratory failure - presumed COPD exacerbation   2.  Abdominal pain - ddx: gastroenteritis, ?colitis, ?ischemic etiology, CT A/P with contrast: 2 mm nonobstructing right nephrolithiasis.  Small stable right renal cyst.  Diverticulosis.  Hysterectomy. 1.6 and 1.1 cm stable splenic lesion with no acute pathology  noted   3.  Hypertension   4.  Type 2 DM   5.  Hyperlipidemia   6.  Peripheral neuropathy   7.  Left lower lobe nodule   8.  History of tobacco abuse   9.  Pulmonary nodule   10.  Bullous emphysema   11.  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  GERD   13.  2 mm nonobstructing right nephrolithiasis     Plan:             ??  Continue breo, albuterol prn   ??  Titrate oxygen to maintain saturation above 90%   ??  Continue singulair, cardizem, trazodone, spiriva   ??  glucommander protocol   ??  Pulmonary on board appreciate recommendations   ??  GI consulted for abdominal pain - conservative management for now   ??  Stool studies pending   ??  HCV RNA pending   ??  Continue protonix    ??  patient has no reported diarrhea since admission   ??  Pain control, antiemetics   ??  Stop fluids      Diet: diabetic   DVT ppx: heparin        Total clinical care time is 35 minutes,  including review of chart including all labs, radiology, past medical history, and discussion with patient and  family. Greater than 50% of my time was spent in coordination of care and counseling          Disposition:                 hospitalization     Objective:     BP 97/77 (BP 1 Location: Left arm, BP Patient Position: Supine)    Pulse 90    Temp 97.8 ??F (36.6 ??C)    Resp 18  Ht  (1.727 m)    Wt 93.2 kg (205  lb 7.5 oz)    SpO2 96%    Breastfeeding No    BMI 31.24 kg/m??       General: alert oriented in mild distress from pain   HEENT: normocephalic, atraumatic   Lungs: clear to auscultation   Heart: regular rate and rhythm   Abdomen: LLQ tenderness   Extremities: no edema   Skin: texture turgor normal   Neuro: no FNDs     Current Medications:        Current Facility-Administered Medications:    ?  ondansetron (ZOFRAN) injection 4 mg, 4 mg, IntraVENous, Q4H PRN, Dixon, Chantel D, NP   ?  HYDROcodone-acetaminophen (NORCO) 5-325 mg per tablet 1-2 Tab, 1-2 Tab, Oral, Q4H PRN, Rande Brunt, MD, 2 Tab at 05/09/19 1046   ?  0.9% sodium chloride infusion, 75 mL/hr, IntraVENous, CONTINUOUS, Rande Brunt, MD, Last Rate: 75 mL/hr at 05/08/19 1842, 75 mL/hr at 05/08/19 1842   ?  pantoprazole (PROTONIX) 40 mg in 0.9% sodium chloride 10 mL injection, 40 mg, IntraVENous, DAILY, Dixon, Chantel D, NP, 40 mg at 05/09/19 0925   ?  cetirizine (ZYRTEC) tablet 10 mg, 10 mg, Oral, DAILY, Roderic Scarce, MD, 10 mg at 05/09/19 9147   ?  cloNIDine HCL (CATAPRES) tablet 0.3 mg, 0.3 mg, Oral, BID, Roderic Scarce, MD, 0.3 mg at 05/09/19 8295   ?  dilTIAZem ER (CARDIZEM CD) capsule 240 mg, 240 mg, Oral, DAILY, Roderic Scarce, MD, 240 mg at 05/09/19 6213   ?  fluticasone-vilanterol (BREO ELLIPTA) 173mcg-25mcg/puff, 1 Puff, Inhalation, DAILY, Roderic Scarce, MD, 1 Puff at 05/09/19 0945   ?  gabapentin (NEURONTIN) capsule 300 mg, 300 mg, Oral, TID, Roderic Scarce, MD, 300 mg at 05/09/19 0865   ?  guaiFENesin ER (MUCINEX) tablet 600 mg,  600 mg, Oral, Q12H, Roderic Scarce, MD, 600 mg at 05/09/19 7846   ?  losartan (COZAAR) tablet 50 mg, 50 mg, Oral, DAILY, Roderic Scarce, MD, 50 mg at 05/09/19 9629   ?  methocarbamoL (ROBAXIN) tablet 500 mg, 500 mg, Oral, QID, Roderic Scarce, MD, 500 mg at 05/09/19 1340   ?  montelukast (SINGULAIR) tablet 10 mg, 10 mg, Oral, QHS, Roderic Scarce, MD, 10 mg at 05/08/19 2129   ?  rosuvastatin (CRESTOR) tablet 20 mg, 20 mg, Oral, QHS, Roderic Scarce, MD, 20 mg at 05/08/19 2130   ?  spironolactone (ALDACTONE) tablet 100 mg, 100 mg, Oral, DAILY, Roderic Scarce, MD, 100 mg at 05/09/19 5284   ?  tiotropium bromide (SPIRIVA RESPIMAT) 2.5 mcg Salvadore Farber, 1 [place], Inhalation, DAILY, Roderic Scarce, MD, 1 Puff at 05/09/19 0945   ?  traZODone (DESYREL) tablet 50 mg, 50 mg, Oral, QHS PRN, Roderic Scarce, MD, 50 mg at 05/08/19 2130   ?  sodium chloride (NS) flush 5-10 mL, 5-10 mL, IntraVENous, Q8H, Roderic Scarce, MD, 10 mL at 05/09/19 1340   ?  sodium chloride (NS) flush 5-10 mL, 5-10 mL, IntraVENous, PRN, Roderic Scarce, MD   ?  naloxone [facility]) injection 0.1 mg, 0.1 mg, IntraVENous, PRN, Roderic Scarce, MD   ?  acetaminophen (TYLENOL) tablet 650 mg, 650 mg, Oral, Q6H PRN, Roderic Scarce, MD, 650 mg at 05/08/19 0601   ?  morphine injection 1 mg, 1 mg, IntraVENous, Q4H PRN, Roderic Scarce, MD, 1 mg at 05/09/19 1340   ?  alum-mag hydroxide-simeth (MYLANTA) oral suspension 30 mL, 30 mL, Oral, Q4H PRN, Roderic Scarce, MD   ?  albuterol (PROVENTIL VENTOLIN) nebulizer solution 2.5 mg, 2.5 mg, Nebulization, Q6H PRN, Roderic Scarce, MD   ?  heparin (porcine) injection 5,000 Units, 5,000 Units, SubCUTAneous, Q8H, Roderic Scarce, MD, 5,000 Units at 05/09/19 3300   ?  hydrALAZINE (APRESOLINE) 20 mg/mL injection 10 mg, 10 mg, IntraVENous, Q6H PRN, Roderic Scarce, MD, 10 mg at 05/08/19 1108   ?  dextrose (D50) infusion 5-25 g, 10-50 mL, IntraVENous, PRN, Roderic Scarce, MD   ?  glucagon (GLUCAGEN) injection 1 mg, 1 mg, IntraMUSCular, PRN,  Roderic Scarce, MD   ?  insulin glargine (LANTUS) injection 1-100 Units, 1-100 Units, SubCUTAneous, QHS, Roderic Scarce, MD, 15 Units at 05/08/19 2130   ?  insulin lispro (HUMALOG) injection 1-100 Units, 1-100 Units, SubCUTAneous, AC&HS, Roderic Scarce, MD, 4 Units at 05/09/19 1250   ?  insulin lispro (HUMALOG) injection 1-100 Units, 1-100 Units, SubCUTAneous, PRN, Roderic Scarce, MD     Labs:          Recent Results (from the past 24 hour(s))     GLUCOSE, POC          Collection Time: 05/08/19  5:45 PM         Result  Value  Ref Range            Glucose (POC)  171 (H)  65 - 105 mg/dL       GLUCOSE, POC          Collection Time: 05/08/19  9:14 PM         Result  Value  Ref Range            Glucose (POC)  181 (H)  65 - 105 mg/dL       CBC WITH AUTOMATED DIFF          Collection Time: 05/09/19  3:41 AM         Result  Value  Ref Range            WBC  12.8 (H)  4.0 - 11.0 1000/mm3       RBC  4.65  3.60 - 5.20 M/uL       HGB  13.9  13.0 - 17.2 gm/dl       HCT  76.2  26.3 - 50.0 %       MCV  89.7  80.0 - 98.0 fL       MCH  29.9  25.4 - 34.6 pg       MCHC  33.3  30.0 - 36.0 gm/dl       PLATELET  335  456 - 450 1000/mm3       MPV  11.1 (H)  6.0 - 10.0 fL       RDW-SD  45.1  36.4 - 46.3         NRBC  0  0 - 0         IMMATURE GRANULOCYTES  0.6  0.0 - 3.0 %       NEUTROPHILS  77.7 (H)  34 - 64 %       LYMPHOCYTES  9.7 (L)  28 - 48 %       MONOCYTES  11.9  1 - 13 %       EOSINOPHILS  0.0  0 - 5 %       BASOPHILS  0.1  0 - 3 %       METABOLIC PANEL, BASIC          Collection Time: 05/09/19  3:41 AM         Result  Value  Ref Range            Sodium  135 (L)  136 - 145 mEq/L       Potassium  4.8  3.5 - 5.1 mEq/L       Chloride  101  98 - 107 mEq/L       CO2  26  21 - 32 mEq/L       Glucose  156 (H)  74 - 106 mg/dl       BUN  37 (H)  7 - 25 mg/dl       Creatinine  1.4 (H)  0.6 - 1.3 mg/dl       GFR est AA  13.048.0          GFR est non-AA  40          Calcium  9.7  8.5 - 10.1 mg/dl       Anion gap  8  5 - 15 mmol/L       LACTIC ACID           Collection Time: 05/09/19  3:41 AM         Result  Value  Ref Range            Lactic Acid  1.0  0.4 - 2.0 mmol/L       GLUCOSE, POC          Collection Time: 05/09/19  8:03 AM         Result  Value  Ref Range            Glucose (POC)  211 (H)  65 - 105 mg/dL       GLUCOSE, POC          Collection Time: 05/09/19 12:02 PM         Result  Value  Ref Range            Glucose (POC)  109 (H)  65 - 105 mg/dL          Radiology     No results found.       Portions of this electronic record were dictated using Conservation officer, historic buildingsDragon voice recognition software. Unintended errors in translation may occur.         Rande BruntAamir Taneisha Fuson, MD   [facility]   [facility]   May 09, 2019   7:50 AM
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
## 2019-05-10 LAB — COMPREHENSIVE METABOLIC PANEL
ALT: 16 U/L (ref 12–78)
AST: 8 U/L — ABNORMAL LOW (ref 15–37)
Albumin: 3.4 gm/dl (ref 3.4–5.0)
Alkaline Phosphatase: 74 U/L (ref 45–117)
Anion Gap: 6 mmol/L (ref 5–15)
BUN: 51 mg/dl — ABNORMAL HIGH (ref 7–25)
CO2: 25 mEq/L (ref 21–32)
Calcium: 8.5 mg/dl (ref 8.5–10.1)
Chloride: 104 mEq/L (ref 98–107)
Creatinine: 1.6 mg/dl — ABNORMAL HIGH (ref 0.6–1.3)
EGFR IF NonAfrican American: 34
GFR African American: 41
Glucose: 136 mg/dl — ABNORMAL HIGH (ref 74–106)
Potassium: 4.4 mEq/L (ref 3.5–5.1)
Sodium: 135 mEq/L — ABNORMAL LOW (ref 136–145)
Total Bilirubin: 0.4 mg/dl (ref 0.2–1.0)
Total Protein: 7.3 gm/dl (ref 6.4–8.2)

## 2019-05-10 LAB — POCT GLUCOSE
POC Glucose: 144 mg/dL — ABNORMAL HIGH (ref 65–105)
POC Glucose: 178 mg/dL — ABNORMAL HIGH (ref 65–105)
POC Glucose: 189 mg/dL — ABNORMAL HIGH (ref 65–105)
POC Glucose: 191 mg/dL — ABNORMAL HIGH (ref 65–105)

## 2019-05-10 LAB — CBC
Hematocrit: 37.6 % (ref 37.0–50.0)
Hemoglobin: 12.6 gm/dl — ABNORMAL LOW (ref 13.0–17.2)
MCH: 29.9 pg (ref 25.4–34.6)
MCHC: 33.5 gm/dl (ref 30.0–36.0)
MCV: 89.3 fL (ref 80.0–98.0)
MPV: 10.9 fL — ABNORMAL HIGH (ref 6.0–10.0)
Platelets: 204 10*3/uL (ref 140–450)
RBC: 4.21 M/uL (ref 3.60–5.20)
RDW-SD: 45.2 (ref 36.4–46.3)
WBC: 15.9 10*3/uL — ABNORMAL HIGH (ref 4.0–11.0)

## 2019-05-10 LAB — METABOLIC PANEL, COMPREHENSIVE
ALT (SGPT): 16 U/L (ref 12–78)
AST (SGOT): 8 U/L — ABNORMAL LOW (ref 15–37)
Albumin: 3.4 gm/dl (ref 3.4–5.0)
Alk. phosphatase: 74 U/L (ref 45–117)
Anion gap: 6 mmol/L (ref 5–15)
BUN: 51 mg/dl — ABNORMAL HIGH (ref 7–25)
Bilirubin, total: 0.4 mg/dl (ref 0.2–1.0)
CO2: 25 mEq/L (ref 21–32)
Calcium: 8.5 mg/dl (ref 8.5–10.1)
Chloride: 104 mEq/L (ref 98–107)
Creatinine: 1.6 mg/dl — ABNORMAL HIGH (ref 0.6–1.3)
GFR est AA: 41
GFR est non-AA: 34
Glucose: 136 mg/dl — ABNORMAL HIGH (ref 74–106)
Potassium: 4.4 mEq/L (ref 3.5–5.1)
Protein, total: 7.3 gm/dl (ref 6.4–8.2)
Sodium: 135 mEq/L — ABNORMAL LOW (ref 136–145)

## 2019-05-10 LAB — CBC W/O DIFF
HCT: 37.6 % (ref 37.0–50.0)
HGB: 12.6 gm/dl — ABNORMAL LOW (ref 13.0–17.2)
MCH: 29.9 pg (ref 25.4–34.6)
MCHC: 33.5 gm/dl (ref 30.0–36.0)
MCV: 89.3 fL (ref 80.0–98.0)
MPV: 10.9 fL — ABNORMAL HIGH (ref 6.0–10.0)
PLATELET: 204 10*3/uL (ref 140–450)
RBC: 4.21 M/uL (ref 3.60–5.20)
RDW-SD: 45.2 (ref 36.4–46.3)
WBC: 15.9 10*3/uL — ABNORMAL HIGH (ref 4.0–11.0)

## 2019-05-10 LAB — GLUCOSE, POC
Glucose (POC): 144 mg/dL — ABNORMAL HIGH (ref 65–105)
Glucose (POC): 178 mg/dL — ABNORMAL HIGH (ref 65–105)
Glucose (POC): 189 mg/dL — ABNORMAL HIGH (ref 65–105)
Glucose (POC): 191 mg/dL — ABNORMAL HIGH (ref 65–105)

## 2019-05-10 MED ORDER — LACTATED RINGERS BOLUS IV
Freq: Once | INTRAVENOUS | Status: AC
Start: 2019-05-10 — End: 2019-05-10
  Administered 2019-05-10: 17:00:00 via INTRAVENOUS

## 2019-05-10 MED ORDER — LACTATED RINGERS IV
INTRAVENOUS | Status: DC
Start: 2019-05-10 — End: 2019-05-13
  Administered 2019-05-10 – 2019-05-13 (×6): via INTRAVENOUS

## 2019-05-10 MED FILL — GABAPENTIN 300 MG CAP: 300 mg | ORAL | Qty: 1

## 2019-05-10 MED FILL — METHOCARBAMOL 500 MG TAB: 500 mg | ORAL | Qty: 1

## 2019-05-10 MED FILL — ROSUVASTATIN 20 MG TAB: 20 mg | ORAL | Qty: 1

## 2019-05-10 MED FILL — SPIRONOLACTONE 25 MG TAB: 25 mg | ORAL | Qty: 4

## 2019-05-10 MED FILL — SODIUM CHLORIDE 0.9 % IJ SYRG: INTRAMUSCULAR | Qty: 10

## 2019-05-10 MED FILL — DILTIAZEM ER 240 MG 24 HR CAP: 240 mg | ORAL | Qty: 1

## 2019-05-10 MED FILL — INSULIN GLARGINE 100 UNIT/ML INJECTION: 100 unit/mL | SUBCUTANEOUS | Qty: 1

## 2019-05-10 MED FILL — MORPHINE 2 MG/ML INJECTION: 2 mg/mL | INTRAMUSCULAR | Qty: 1

## 2019-05-10 MED FILL — HYDROCODONE-ACETAMINOPHEN 5 MG-325 MG TAB: 5-325 mg | ORAL | Qty: 2

## 2019-05-10 MED FILL — LOSARTAN 50 MG TAB: 50 mg | ORAL | Qty: 1

## 2019-05-10 MED FILL — PANTOPRAZOLE 40 MG IV SOLR: 40 mg | INTRAVENOUS | Qty: 40

## 2019-05-10 MED FILL — CLONIDINE 0.1 MG TAB: 0.1 mg | ORAL | Qty: 1

## 2019-05-10 MED FILL — TRAZODONE 50 MG TAB: 50 mg | ORAL | Qty: 1

## 2019-05-10 MED FILL — HEPARIN (PORCINE) 5,000 UNIT/ML IJ SOLN: 5000 unit/mL | INTRAMUSCULAR | Qty: 1

## 2019-05-10 MED FILL — MONTELUKAST 10 MG TAB: 10 mg | ORAL | Qty: 1

## 2019-05-10 MED FILL — MUCINEX 600 MG TABLET, EXTENDED RELEASE: 600 mg | ORAL | Qty: 1

## 2019-05-10 MED FILL — CETIRIZINE 5 MG TAB: 5 mg | ORAL | Qty: 2

## 2019-05-10 MED FILL — LACTATED RINGERS IV: INTRAVENOUS | Qty: 1000

## 2019-05-10 MED FILL — INSULIN LISPRO 100 UNIT/ML INJECTION: 100 unit/mL | SUBCUTANEOUS | Qty: 1

## 2019-05-10 MED FILL — LACTATED RINGERS IV: INTRAVENOUS | Qty: 500

## 2019-05-10 NOTE — Progress Notes (Signed)
Problem: Falls - Risk of  Goal: *Absence of Falls  Description: Document Schmid Fall Risk and appropriate interventions in the flowsheet.  Outcome: Progressing Towards Goal  Note: Fall Risk Interventions:  Mobility Interventions: Patient to call before getting OOB, Bed/chair exit alarm    Mentation Interventions: Adequate sleep, hydration, pain control, Bed/chair exit alarm    Medication Interventions: Bed/chair exit alarm, Patient to call before getting OOB                   Problem: Patient Education: Go to Patient Education Activity  Goal: Patient/Family Education  Outcome: Progressing Towards Goal     Problem: Gas Exchange - Impaired  Goal: *Absence of hypoxia  Outcome: Progressing Towards Goal     Problem: Patient Education: Go to Patient Education Activity  Goal: Patient/Family Education  Outcome: Progressing Towards Goal     Problem: Risk for Spread of Infection  Goal: Prevent transmission of infectious organism to others  Description: Prevent the transmission of infectious organisms to other patients, staff members, and visitors.  Outcome: Progressing Towards Goal     Problem: Patient Education:  Go to Education Activity  Goal: Patient/Family Education  Outcome: Progressing Towards Goal     Problem: Patient Education: Go to Patient Education Activity  Goal: Patient/Family Education  Outcome: Progressing Towards Goal

## 2019-05-10 NOTE — Progress Notes (Signed)
Problem: Falls - Risk of  Goal: *Absence of Falls  Description: Document Schmid Fall Risk and appropriate interventions in the flowsheet.  Outcome: Progressing Towards Goal  Note: Fall Risk Interventions:  Mobility Interventions: Assess mobility with egress test, Bed/chair exit alarm, Patient to call before getting OOB    Mentation Interventions: Adequate sleep, hydration, pain control, Bed/chair exit alarm, Door open when patient unattended    Medication Interventions: Bed/chair exit alarm, Patient to call before getting OOB                   Problem: Patient Education: Go to Patient Education Activity  Goal: Patient/Family Education  Outcome: Progressing Towards Goal     Problem: Gas Exchange - Impaired  Goal: *Absence of hypoxia  Outcome: Progressing Towards Goal

## 2019-05-10 NOTE — Progress Notes (Signed)
Problem: Falls - Risk of  Goal: *Absence of Falls  Description: Document Schmid Fall Risk and appropriate interventions in the flowsheet.  Outcome: Progressing Towards Goal  Note: Fall Risk Interventions:  Mobility Interventions: Assess mobility with egress test, Bed/chair exit alarm, Patient to call before getting OOB    Mentation Interventions: Adequate sleep, hydration, pain control, Bed/chair exit alarm, Door open when patient unattended, Reorient patient, More frequent rounding, Update white board    Medication Interventions: Bed/chair exit alarm, Patient to call before getting OOB                   Problem: Patient Education: Go to Patient Education Activity  Goal: Patient/Family Education  Outcome: Progressing Towards Goal     Problem: Gas Exchange - Impaired  Goal: *Absence of hypoxia  Outcome: Progressing Towards Goal     Problem: Patient Education: Go to Patient Education Activity  Goal: Patient/Family Education  Outcome: Progressing Towards Goal     Problem: Risk for Spread of Infection  Goal: Prevent transmission of infectious organism to others  Description: Prevent the transmission of infectious organisms to other patients, staff members, and visitors.  Outcome: Progressing Towards Goal     Problem: Patient Education:  Go to Education Activity  Goal: Patient/Family Education  Outcome: Progressing Towards Goal

## 2019-05-10 NOTE — Progress Notes (Signed)
INTERNAL MEDICINE PROGRESS NOTE    Date of note:      May 10, 2019  Patient:               [name] [name], 68 y.o., female  Admit Date:        05/07/2019  Length of Stay:  3 day(s)    Overnight/24-hour Events:    Reviewed.  Subjective:     Tolerating po intake, abdominal pain comes and goes lower quadrants  Assessment:           1. Acute hypoxic respiratory failure - presumed COPD exacerbation  2. Abdominal pain - ddx: gastroenteritis, ?colitis, ?ischemic etiology, CT A/P with contrast: 2 mm nonobstructing right nephrolithiasis.  Small stable right renal cyst.  Diverticulosis.  Hysterectomy. 1.6 and 1.1 cm stable splenic lesion with no acute pathology noted  3. Acute kidney injury - Cr 1.6 this am suspect prerenal  4. Hypertension  5. Type 2 DM  6. Hyperlipidemia  7. Peripheral neuropathy  8. Left lower lobe nodule  9. History of tobacco abuse  10. Pulmonary nodule  11. Bullous emphysema  12. 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 GERD  14. 2 mm nonobstructing right nephrolithiasis  Plan:           ? Continue breo, albuterol prn  ? Titrate oxygen to maintain saturation above 90%  ? Continue singulair, cardizem, trazodone, spiriva  ? glucommander protocol  ? GI consulted for abdominal pain - conservative management for now  ? Stool studies pending  ? HCV RNA pending  ? Continue protonix   ? patient has no reported diarrhea since admission  ? Pain control, antiemetics  ? Resume IV fluids suspect she is prerenal, give bolus followed by maintenance and monitor renal function  ? Losartan held    Diet: diabetic  DVT ppx: heparin    Total clinical care time is 30 minutes, including review of chart including all labs, radiology, past medical history, and discussion with patient and family. Greater than 50% of my time was spent in coordination of care and counseling     Disposition:               hospitalization  Objective:    BP 122/72 (BP 1 Location: Right arm, BP Patient Position: Supine)    Pulse 92    Temp 98.1 ??F (36.7 ??C)    Resp 18    Ht 5' 8" (1.727 m)    Wt 93.2 kg (205 lb 7.5 oz)    SpO2 (!) 87%    Breastfeeding No    BMI 31.24 kg/m??     General: alert oriented in mild distress from pain  HEENT: normocephalic, atraumatic  Lungs: clear to auscultation  Heart: regular rate and rhythm  Abdomen: LLQ tenderness  Extremities: no edema  Skin: texture turgor normal  Neuro: no FNDs  Current Medications:     Current Facility-Administered Medications:   ???  lactated Ringers infusion, 75 mL/hr, IntraVENous, CONTINUOUS, Holland Falling, MD  ???  lactated ringers bolus infusion 500 mL, 500 mL, IntraVENous, ONCE, Holland Falling, MD  ???  ondansetron [facility]) injection 4 mg, 4 mg, IntraVENous, Q4H PRN, Dixon, Chantel D, NP  ???  HYDROcodone-acetaminophen (NORCO) 5-325 mg per tablet 1-2 Tab, 1-2 Tab, Oral, Q4H PRN, Holland Falling, MD, 2 Tab at 05/10/19 0341  ???  pantoprazole (PROTONIX) 40 mg in 0.9% sodium chloride 10 mL injection, 40 mg, IntraVENous, DAILY, Dixon, Chantel D, NP,  40 mg at 05/10/19 7673  ???  cetirizine (ZYRTEC) tablet 10 mg, 10 mg, Oral, DAILY, Charlaine Dalton, MD, 10 mg at 05/10/19 4193  ???  cloNIDine HCL (CATAPRES) tablet 0.3 mg, 0.3 mg, Oral, BID, Charlaine Dalton, MD, 0.3 mg at 05/10/19 7902  ???  dilTIAZem ER (CARDIZEM CD) capsule 240 mg, 240 mg, Oral, DAILY, Charlaine Dalton, MD, 240 mg at 05/10/19 4097  ???  fluticasone-vilanterol (BREO ELLIPTA) 144mg-25mcg/puff, 1 Puff, Inhalation, DAILY, VCharlaine Dalton MD, 1 Puff at 05/10/19 0216 [id number] ???  gabapentin (NEURONTIN) capsule 300 mg, 300 mg, Oral, TID, VCharlaine Dalton MD, 300 mg at 05/10/19 09924 ???  guaiFENesin ER (MUCINEX) tablet 600 mg, 600 mg, Oral, Q12H, VCharlaine Dalton MD, 600 mg at 05/10/19 02683 ???  [Held by provider] losartan (COZAAR) tablet 50 mg, 50 mg, Oral, DAILY, VCharlaine Dalton MD, 50 mg at 05/09/19 04196 ???  methocarbamoL (ROBAXIN) tablet 500 mg, 500 mg, Oral, QID, VCharlaine Dalton MD, 500 mg at 05/10/19 02229 ???  montelukast (SINGULAIR) tablet 10 mg, 10 mg, Oral, QHS, VCharlaine Dalton MD, 10 mg at 05/09/19 2127  ???  rosuvastatin (CRESTOR) tablet 20 mg, 20 mg, Oral, QHS, VCharlaine Dalton MD, 20 mg at 05/09/19 2127  ???  spironolactone (ALDACTONE) tablet 100 mg, 100 mg, Oral, DAILY, VCharlaine Dalton MD, 100 mg at 05/10/19 07989 ???  tiotropium bromide (SPIRIVA RESPIMAT) 2.5 mcg /[place] [address] [place] Inhalation, DAILY, VCharlaine Dalton MD, 1 Puff at 05/10/19 [phone] ???  traZODone (DESYREL) tablet 50 mg, 50 mg, Oral, QHS PRN, VCharlaine Dalton MD, 50 mg at 05/10/19 0049  ???  sodium chloride (NS) flush 5-10 mL, 5-10 mL, IntraVENous, Q8H, VCharlaine Dalton MD, 10 mL at 05/10/19 04174 ???  sodium chloride (NS) flush 5-10 mL, 5-10 mL, IntraVENous, PRN, VCharlaine Dalton MD  ???  naloxone (NARCAN) injection 0.1 mg, 0.1 mg, IntraVENous, PRN, VCharlaine Dalton MD  ???  acetaminophen (TYLENOL) tablet 650 mg, 650 mg, Oral, Q6H PRN, VCharlaine Dalton MD, 650 mg at 05/08/19 0601  ???  morphine injection 1 mg, 1 mg, IntraVENous, Q4H PRN, VCharlaine Dalton MD, 1 mg at 05/10/19 00814 ???  alum-mag hydroxide-simeth (MYLANTA) oral suspension 30 mL, 30 mL, Oral, Q4H PRN, VCharlaine Dalton MD  ???  albuterol (PROVENTIL VENTOLIN) nebulizer solution 2.5 mg, 2.5 mg, Nebulization, Q6H PRN, VCharlaine Dalton MD  ???  heparin (porcine) injection 5,000 Units, 5,000 Units, SubCUTAneous, Q8H, VCharlaine Dalton MD, 5,000 Units at 05/10/19 04818 ???  hydrALAZINE (APRESOLINE) 20 mg/mL injection 10 mg, 10 mg, IntraVENous, Q6H PRN, VCharlaine Dalton MD, 10 mg at 05/08/19 1108  ???  dextrose (D50) infusion 5-25 g, 10-50 mL, IntraVENous, PRN, VCharlaine Dalton MD  ???  glucagon (GLUCAGEN) injection 1 mg, 1 mg, IntraMUSCular, PRN, VCharlaine Dalton MD  ???  insulin glargine (LANTUS) injection 1-100 Units, 1-100 Units, SubCUTAneous, QHS, VCharlaine Dalton MD, 15 Units at 05/09/19 2132  ???  insulin lispro (HUMALOG) injection 1-100 Units, 1-100 Units,  SubCUTAneous, AC&HS, VCharlaine Dalton MD, 5 Units at 05/10/19 05631 ???  insulin lispro (HUMALOG) injection 1-100 Units, 1-100 Units, SubCUTAneous, PRN, VCharlaine Dalton MD  Labs:     Recent Results (from the past 24 hour(s))   GLUCOSE, POC    Collection Time: 05/09/19  5:10 PM   Result Value Ref Range    Glucose (POC) 128 (H) 65 - 105 mg/dL   CULTURE, URINE    Collection Time: 05/09/19  8:13 PM    Specimen: Clean catch; Urine  Result Value Ref Range    Culture result Too Young To Read     GLUCOSE, POC    Collection Time: 05/09/19  9:21 PM   Result Value Ref Range    Glucose (POC) 178 (H) 65 - 105 mg/dL   GLUCOSE, POC    Collection Time: 05/10/19  8:23 AM   Result Value Ref Range    Glucose (POC) 144 (H) 65 - 105 mg/dL   CBC W/O DIFF    Collection Time: 05/10/19  8:37 AM   Result Value Ref Range    WBC 15.9 (H) 4.0 - 11.0 1000/mm3    RBC 4.21 3.60 - 5.20 M/uL    HGB 12.6 (L) 13.0 - 17.2 gm/dl    HCT 37.6 37.0 - 50.0 %    MCV 89.3 80.0 - 98.0 fL    MCH 29.9 25.4 - 34.6 pg    MCHC 33.5 30.0 - 36.0 gm/dl    PLATELET 204 140 - 450 1000/mm3    MPV 10.9 (H) 6.0 - 10.0 fL    RDW-SD 45.2 36.4 - 19.5     METABOLIC PANEL, COMPREHENSIVE    Collection Time: 05/10/19  8:37 AM   Result Value Ref Range    Sodium 135 (L) 136 - 145 mEq/L    Potassium 4.4 3.5 - 5.1 mEq/L    Chloride 104 98 - 107 mEq/L    CO2 25 21 - 32 mEq/L    Glucose 136 (H) 74 - 106 mg/dl    BUN 51 (H) 7 - 25 mg/dl    Creatinine 1.6 (H) 0.6 - 1.3 mg/dl    GFR est AA 41.0      GFR est non-AA 34      Calcium 8.5 8.5 - 10.1 mg/dl    AST (SGOT) 8 (L) 15 - 37 U/L    ALT (SGPT) 16 12 - 78 U/L    Alk. phosphatase 74 45 - 117 U/L    Bilirubin, total 0.4 0.2 - 1.0 mg/dl    Protein, total 7.3 6.4 - 8.2 gm/dl    Albumin 3.4 3.4 - 5.0 gm/dl    Anion gap 6 5 - 15 mmol/L   GLUCOSE, POC    Collection Time: 05/10/19 11:51 AM   Result Value Ref Range    Glucose (POC) 191 (H) 65 - 105 mg/dL     Radiology   No results found.      Portions of this electronic record were dictated using Systems analyst. Unintended errors in translation may occur.      Holland Falling, MD  [facility]  May 10, 2019  7:50 AM

## 2019-05-10 NOTE — Progress Notes (Signed)
Gastrointestinal Progress Note    Patient Name: [name] [name]    YWVPX'T Date: 05/10/2019    Admit Date: 05/07/2019    Subjective:     [name] [name] is a 68 y.o. Female who we are seeing b/o abdominal pain, nausea and vomiting.  She reports continued good control of nausea with anti-emetics and pain with analgesics.  She is tolerating her diet and has not had a bowel movement in more than 24 hours.        Current Facility-Administered Medications   Medication Dose Route Frequency   ??? lactated Ringers infusion  75 mL/hr IntraVENous CONTINUOUS   ??? ondansetron (ZOFRAN) injection 4 mg  4 mg IntraVENous Q4H PRN   ??? HYDROcodone-acetaminophen (NORCO) 5-325 mg per tablet 1-2 Tab  1-2 Tab Oral Q4H PRN   ??? pantoprazole (PROTONIX) 40 mg in 0.9% sodium chloride 10 mL injection  40 mg IntraVENous DAILY   ??? cetirizine (ZYRTEC) tablet 10 mg  10 mg Oral DAILY   ??? cloNIDine HCL (CATAPRES) tablet 0.3 mg  0.3 mg Oral BID   ??? dilTIAZem ER (CARDIZEM CD) capsule 240 mg  240 mg Oral DAILY   ??? fluticasone-vilanterol (BREO ELLIPTA) 168mcg-25mcg/puff  1 Puff Inhalation DAILY   ??? gabapentin (NEURONTIN) capsule 300 mg  300 mg Oral TID   ??? guaiFENesin ER (MUCINEX) tablet 600 mg  600 mg Oral Q12H   ??? [Held by provider] losartan (COZAAR) tablet 50 mg  50 mg Oral DAILY   ??? methocarbamoL (ROBAXIN) tablet 500 mg  500 mg Oral QID   ??? montelukast (SINGULAIR) tablet 10 mg  10 mg Oral QHS   ??? rosuvastatin (CRESTOR) tablet 20 mg  20 mg Oral QHS   ??? spironolactone (ALDACTONE) tablet 100 mg  100 mg Oral DAILY   ??? tiotropium bromide (SPIRIVA RESPIMAT) 2.5 mcg /actuation  1 Puff Inhalation DAILY   ??? traZODone (DESYREL) tablet 50 mg  50 mg Oral QHS PRN   ??? sodium chloride (NS) flush 5-10 mL  5-10 mL IntraVENous Q8H   ??? sodium chloride (NS) flush 5-10 mL  5-10 mL IntraVENous PRN   ??? naloxone (NARCAN) injection 0.1 mg  0.1 mg IntraVENous PRN   ??? acetaminophen (TYLENOL) tablet 650 mg  650 mg Oral Q6H PRN   ??? morphine injection 1 mg  1 mg IntraVENous Q4H PRN    ??? alum-mag hydroxide-simeth (MYLANTA) oral suspension 30 mL  30 mL Oral Q4H PRN   ??? albuterol (PROVENTIL VENTOLIN) nebulizer solution 2.5 mg  2.5 mg Nebulization Q6H PRN   ??? heparin (porcine) injection 5,000 Units  5,000 Units SubCUTAneous Q8H   ??? hydrALAZINE (APRESOLINE) 20 mg/mL injection 10 mg  10 mg IntraVENous Q6H PRN   ??? dextrose (D50) infusion 5-25 g  10-50 mL IntraVENous PRN   ??? glucagon (GLUCAGEN) injection 1 mg  1 mg IntraMUSCular PRN   ??? insulin glargine (LANTUS) injection 1-100 Units  1-100 Units SubCUTAneous QHS   ??? insulin lispro (HUMALOG) injection 1-100 Units  1-100 Units SubCUTAneous AC&HS   ??? insulin lispro (HUMALOG) injection 1-100 Units  1-100 Units SubCUTAneous PRN          Objective:     Visit Vitals  BP 107/62 (BP 1 Location: Right arm, BP Patient Position: Supine)   Pulse 89   Temp 98.2 ??F (36.8 ??C)   Resp 18   Ht 5\' 8"  (1.727 m)   Wt 93.2 kg (205 lb 7.5 oz)   SpO2 90%   Breastfeeding No  BMI 31.24 kg/m??       General appearance: alert, cooperative, no distress, appears stated age  Abdomen: soft, non-tender. Bowel sounds normal. No masses,  no organomegaly    Data Review:      Labs: Results:       Chemistry Recent Labs     05/10/19  0837 05/09/19  0341 05/08/19  0000   GLU 136* 156* 191*   NA 135* 135* 138   K 4.4 4.8 5.0   CL 104 101 103   CO2 25 26 23    BUN 51* 37* 22   CREA 1.6* 1.4* 1.1   CA 8.5 9.7 10.0   AGAP 6 8 12       CBC w/Diff Recent Labs     05/10/19  0837 05/09/19  0341 05/08/19  0000   WBC 15.9* 12.8* 21.1*   RBC 4.21 4.65 5.20   HGB 12.6* 13.9 15.1   HCT 37.6 41.7 46.4   PLT 204 247 331   GRANS  --  77.7* 93.5*   LYMPH  --  9.7* 4.6*   EOS  --  0.0 0.0      Coagulation No results for input(s): PTP, INR, APTT, INREXT in the last 72 hours.    Liver Enzymes Recent Labs     05/10/19  0837   TP 7.3   ALB 3.4   TBILI 0.4   AP 74   ALT 16      Lipase No results for input(s): LPSE in the last 72 hours.       Assessment:   1.  Nausea, Vomiting and Diarrhea with associated Abdominal  pain??suspect??gastroenteritis/??infectious source. Other Ddx:??inflammatory vs ischemic source??vs adhesive disease from prior abdominal surgery---but functional GI distress is likely playing a significant role in her symptoms  2.  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 S/p 12wk Harvoni treatment per Dr. 05/10/19. SVR achieved. Undetectable viral load week 4 and at conclusion of treatment  ????????????????????????-Liver unremarkable on CT. LFTs and Bilirubin unremarkable ??  3.  Chronic pain syndrome??due to Hip pain s/p??right hip replacement  Plan:   1.  Await results of pending Stool studies, and HCV RNA PCR   2.  continue  Protonix 40mg  every day for now   3.  Continue Antiemetics PRN N/V  4.   Advance??diet??as tolerated????  5.  Continue conservative management --no plans for endoscopic evaluation at this time  6.  More recommendations pending above results and pts clinical condition??  ??          05/12/19. Lendell Caprice, MD, , AGAF  May 10, 2019  Precious Gilding

## 2019-05-10 NOTE — Progress Notes (Signed)
Problem: Falls - Risk of  Goal: *Absence of Falls  Description: Document [name] Fall Risk and appropriate interventions in the flowsheet.  Outcome: Progressing Towards Goal  Note: Fall Risk Interventions:  Mobility Interventions: Assess mobility with egress test, Bed/chair exit alarm, Patient to call before getting OOB    Mentation Interventions: Adequate sleep, hydration, pain control, Bed/chair exit alarm, Door open when patient unattended, Reorient patient, More frequent rounding, Update white board    Medication Interventions: Bed/chair exit alarm, Patient to call before getting OOB                   Problem: Patient Education: Go to Patient Education Activity  Goal: Patient/Family Education  Outcome: Progressing Towards Goal     Problem: Gas Exchange - Impaired  Goal: *Absence of hypoxia  Outcome: Progressing Towards Goal     Problem: Patient Education: Go to Patient Education Activity  Goal: Patient/Family Education  Outcome: Progressing Towards Goal     Problem: Risk for Spread of Infection  Goal: Prevent transmission of infectious organism to others  Description: Prevent the transmission of infectious organisms to other patients, staff members, and visitors.  Outcome: Progressing Towards Goal     Problem: Patient Education:  Go to Education Activity  Goal: Patient/Family Education  Outcome: Progressing Towards Goal

## 2019-05-10 NOTE — Progress Notes (Signed)
Progress Notes by Alexis Goodell, MD at 05/10/19 1815                Author: Alexis Goodell, MD  Service: Gastroenterology  Author Type: Physician       Filed: 05/10/19 1818  Date of Service: 05/10/19 1815  Status: Signed          Editor: Omolola Mittman, Jari Favre, MD (Physician)                       Gastrointestinal Progress Note      Patient Name: [name] [name]      Today's Date: 05/10/2019      Admit Date: 05/07/2019        Subjective:        [name] [name] is a 68 y.o. Female who we are seeing b/o abdominal pain, nausea  and vomiting.  She reports continued good control of nausea with anti-emetics and pain with analgesics.  She is tolerating her diet and has not had a bowel movement in more than 24 hours.              Current Facility-Administered Medications          Medication  Dose  Route  Frequency           ?  lactated Ringers infusion   75 mL/hr  IntraVENous  CONTINUOUS     ?  ondansetron (ZOFRAN) injection 4 mg   4 mg  IntraVENous  Q4H PRN     ?  HYDROcodone-acetaminophen (NORCO) 5-325 mg per tablet 1-2 Tab   1-2 Tab  Oral  Q4H PRN     ?  pantoprazole (PROTONIX) 40 mg in 0.9% sodium chloride 10 mL injection   40 mg  IntraVENous  DAILY     ?  cetirizine (ZYRTEC) tablet 10 mg   10 mg  Oral  DAILY     ?  cloNIDine HCL (CATAPRES) tablet 0.3 mg   0.3 mg  Oral  BID     ?  dilTIAZem ER (CARDIZEM CD) capsule 240 mg   240 mg  Oral  DAILY     ?  fluticasone-vilanterol (BREO ELLIPTA) 162mcg-25mcg/puff   1 Puff  Inhalation  DAILY     ?  gabapentin (NEURONTIN) capsule 300 mg   300 mg  Oral  TID     ?  guaiFENesin ER (MUCINEX) tablet 600 mg   600 mg  Oral  Q12H     ?  [Held by provider] losartan (COZAAR) tablet 50 mg   50 mg  Oral  DAILY     ?  methocarbamoL (ROBAXIN) tablet 500 mg   500 mg  Oral  QID     ?  montelukast (SINGULAIR) tablet 10 mg   10 mg  Oral  QHS     ?  rosuvastatin (CRESTOR) tablet 20 mg   20 mg  Oral  QHS     ?  spironolactone (ALDACTONE) tablet 100 mg   100 mg  Oral  DAILY     ?   tiotropium bromide (SPIRIVA RESPIMAT) 2.5 mcg /actuation   1 Puff  Inhalation  DAILY     ?  traZODone (DESYREL) tablet 50 mg   50 mg  Oral  QHS PRN     ?  sodium chloride (NS) flush 5-10 mL   5-10 mL  IntraVENous  Q8H     ?  sodium chloride (NS) flush 5-10 mL   5-10  mL  IntraVENous  PRN     ?  naloxone (NARCAN) injection 0.1 mg   0.1 mg  IntraVENous  PRN     ?  acetaminophen (TYLENOL) tablet 650 mg   650 mg  Oral  Q6H PRN     ?  morphine injection 1 mg   1 mg  IntraVENous  Q4H PRN     ?  alum-mag hydroxide-simeth (MYLANTA) oral suspension 30 mL   30 mL  Oral  Q4H PRN     ?  albuterol (PROVENTIL VENTOLIN) nebulizer solution 2.5 mg   2.5 mg  Nebulization  Q6H PRN     ?  heparin (porcine) injection 5,000 Units   5,000 Units  SubCUTAneous  Q8H     ?  hydrALAZINE (APRESOLINE) 20 mg/mL injection 10 mg   10 mg  IntraVENous  Q6H PRN     ?  dextrose (D50) infusion 5-25 g   10-50 mL  IntraVENous  PRN     ?  glucagon (GLUCAGEN) injection 1 mg   1 mg  IntraMUSCular  PRN     ?  insulin glargine (LANTUS) injection 1-100 Units   1-100 Units  SubCUTAneous  QHS     ?  insulin lispro (HUMALOG) injection 1-100 Units   1-100 Units  SubCUTAneous  AC&HS           ?  insulin lispro (HUMALOG) injection 1-100 Units   1-100 Units  SubCUTAneous  PRN                 Objective:        Visit Vitals      BP  107/62 (BP 1 Location: Right arm, BP Patient Position: Supine)     Pulse  89     Temp  98.2 ??F (36.8 ??C)     Resp  18     Ht  5\' 8"  (1.727 m)     Wt  93.2 kg (205 lb 7.5 oz)     SpO2  90%     Breastfeeding  No        BMI  31.24 kg/m??           General appearance: alert, cooperative, no distress, appears stated age   Abdomen: soft, non-tender. Bowel sounds normal. No masses,  no organomegaly      Data Review:            Labs:  Results:                  Chemistry  Recent Labs         05/10/19   0837  05/09/19   0341  05/08/19   0000      GLU  136*  156*  191*      NA  135*  135*  138      K  4.4  4.8  5.0      CL  104  101  103      CO2  25  26   23       BUN  51*  37*  22      CREA  1.6*  1.4*  1.1      CA  8.5  9.7  10.0      AGAP  6  8  12                  CBC w/Diff  Recent Labs  05/10/19   0837  05/09/19   0341  05/08/19   0000      WBC  15.9*  12.8*  21.1*      RBC  4.21  4.65  5.20      HGB  12.6*  13.9  15.1      HCT  37.6  41.7  46.4      PLT  204  247  331      GRANS   --   77.7*  93.5*      LYMPH   --   9.7*  4.6*      EOS   --   0.0  0.0                 Coagulation  No results for input(s): PTP, INR, APTT, INREXT in the last 72 hours.         Liver Enzymes  Recent Labs         05/10/19   0837      TP  7.3      ALB  3.4      TBILI  0.4      AP  74      ALT  16                 Lipase  No results for input(s): LPSE in the last 72 hours.             Assessment:     1.  Nausea, Vomiting and Diarrhea with associated Abdominal pain??suspect??gastroenteritis/??infectious source. Other Ddx:??inflammatory vs ischemic  source??vs adhesive disease from prior abdominal surgery---but functional GI distress is likely playing a significant role in her symptoms   2.  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 S/p 12wk Harvoni treatment per Dr. Lendell CapriceSullivan. SVR achieved. Undetectable viral load week 4 and at conclusion of treatment   ????????????????????????-Liver unremarkable on CT. LFTs and Bilirubin unremarkable ??   3.  Chronic pain syndrome??due to Hip pain s/p??right hip replacement     Plan:     1.  Await results of pending Stool studies, and HCV RNA PCR    2.  continue  Protonix 40mg  every day for now    3.  Continue Antiemetics PRN N/V   4.   Advance??diet??as tolerated????   5.  Continue conservative management --no plans for endoscopic evaluation at this time   6.  More recommendations pending above results and pts clinical condition??   ??               Jari FavreSteven M. Precious Gildingandalides, MD, Caleen EssexFACP, FACG, AGAF   May 10, 2019   GReece Agar

## 2019-05-10 NOTE — Progress Notes (Signed)
Problem: Falls - Risk of  Goal: *Absence of Falls  Description: Document [name] Fall Risk and appropriate interventions in the flowsheet.  Outcome: Progressing Towards Goal  Note: Fall Risk Interventions:  Mobility Interventions: Patient to call before getting OOB, Bed/chair exit alarm    Mentation Interventions: Adequate sleep, hydration, pain control, Bed/chair exit alarm    Medication Interventions: Bed/chair exit alarm, Patient to call before getting OOB                   Problem: Patient Education: Go to Patient Education Activity  Goal: Patient/Family Education  Outcome: Progressing Towards Goal     Problem: Gas Exchange - Impaired  Goal: *Absence of hypoxia  Outcome: Progressing Towards Goal     Problem: Patient Education: Go to Patient Education Activity  Goal: Patient/Family Education  Outcome: Progressing Towards Goal     Problem: Risk for Spread of Infection  Goal: Prevent transmission of infectious organism to others  Description: Prevent the transmission of infectious organisms to other patients, staff members, and visitors.  Outcome: Progressing Towards Goal     Problem: Patient Education:  Go to Education Activity  Goal: Patient/Family Education  Outcome: Progressing Towards Goal     Problem: Patient Education: Go to Patient Education Activity  Goal: Patient/Family Education  Outcome: Progressing Towards Goal

## 2019-05-10 NOTE — Progress Notes (Signed)
Progress  Notes by Holland Falling, MD at 05/10/19 1214                Author: Holland Falling, MD  Service: Hospitalist  Author Type: Physician       Filed: 05/10/19 1219  Date of Service: 05/10/19 1214  Status: Signed          Editor: Holland Falling, MD (Physician)                       INTERNAL MEDICINE PROGRESS NOTE      Date of note:      May 10, 2019   Patient:               [name] [name] , 68 y.o., female   Admit Date:        05/07/2019   Length of Stay:  3 day(s)        Overnight/24-hour Events:      Reviewed.     Subjective:       Tolerating po intake, abdominal pain comes and goes lower quadrants     Assessment:             1.  Acute hypoxic respiratory failure - presumed COPD exacerbation   2.  Abdominal pain - ddx: gastroenteritis, ?colitis, ?ischemic etiology, CT A/P with contrast: 2 mm nonobstructing right nephrolithiasis.  Small stable right renal cyst.  Diverticulosis.  Hysterectomy. 1.6 and 1.1 cm stable splenic lesion with no acute pathology  noted   3.  Acute kidney injury - Cr 1.6 this am suspect prerenal   4.  Hypertension   5.  Type 2 DM   6.  Hyperlipidemia   7.  Peripheral neuropathy   8.  Left lower lobe nodule   9.  History of tobacco abuse   10.  Pulmonary nodule   11.  Bullous emphysema   12.  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  GERD   14.  2 mm nonobstructing right nephrolithiasis     Plan:             ??  Continue breo, albuterol prn   ??  Titrate oxygen to maintain saturation above 90%   ??  Continue singulair, cardizem, trazodone, spiriva   ??  glucommander protocol   ??  GI consulted for abdominal pain - conservative management for now   ??  Stool studies pending   ??  HCV RNA pending   ??  Continue protonix    ??  patient has no reported diarrhea since admission   ??  Pain control, antiemetics   ??  Resume IV fluids suspect she is prerenal, give bolus followed by maintenance and monitor renal function   ??  Losartan held      Diet: diabetic   DVT ppx:  heparin        Total clinical care time is 30 minutes, including review of chart including all labs, radiology, past medical history, and discussion with patient and  family. Greater than 50% of my time was spent in coordination of care and counseling          Disposition:                 hospitalization     Objective:     BP 122/72 (BP 1 Location: Right arm, BP Patient Position: Supine)    Pulse 92    Temp 98.1 ??F (36.7 ??  C)    Resp 18    Ht '5\' 8"'  (1.727 m)    Wt 93.2 kg (205  lb 7.5 oz)    SpO2 (!) 87%    Breastfeeding No    BMI 31.24 kg/m??       General: alert oriented in mild distress from pain   HEENT: normocephalic, atraumatic   Lungs: clear to auscultation   Heart: regular rate and rhythm   Abdomen: LLQ tenderness   Extremities: no edema   Skin: texture turgor normal   Neuro: no FNDs     Current Medications:        Current Facility-Administered Medications:    ?  lactated Ringers infusion, 75 mL/hr, IntraVENous, CONTINUOUS, Holland Falling, MD   ?  lactated ringers bolus infusion 500 mL, 500 mL, IntraVENous, ONCE, Holland Falling, MD   ?  ondansetron [facility]) injection 4 mg, 4 mg, IntraVENous, Q4H PRN, Dixon, Chantel D, NP   ?  HYDROcodone-acetaminophen (NORCO) 5-325 mg per tablet 1-2 Tab, 1-2 Tab, Oral, Q4H PRN, Holland Falling, MD, 2 Tab at 05/10/19 0341   ?  pantoprazole (PROTONIX) 40 mg in 0.9% sodium chloride 10 mL injection, 40 mg, IntraVENous, DAILY, Dixon, Chantel D, NP, 40 mg at 05/10/19 1610   ?  cetirizine (ZYRTEC) tablet 10 mg, 10 mg, Oral, DAILY, Charlaine Dalton, MD, 10 mg at 05/10/19 9604   ?  cloNIDine HCL (CATAPRES) tablet 0.3 mg, 0.3 mg, Oral, BID, Charlaine Dalton, MD, 0.3 mg at 05/10/19 5409   ?  dilTIAZem ER (CARDIZEM CD) capsule 240 mg, 240 mg, Oral, DAILY, Charlaine Dalton, MD, 240 mg at 05/10/19 8119   ?  fluticasone-vilanterol (BREO ELLIPTA) 138mg-25mcg/puff, 1 Puff, Inhalation, DAILY, VCharlaine Dalton MD, 1 Puff at 05/10/19 [id number]  ?  gabapentin (NEURONTIN) capsule 300 mg, 300 mg, Oral, TID, VCharlaine Dalton MD, 300 mg at 05/10/19 02956  ?  guaiFENesin ER (MUCINEX) tablet 600 mg, 600 mg, Oral, Q12H, VCharlaine Dalton MD, 600 mg at 05/10/19 02130  ?  [Held by provider] losartan (COZAAR) tablet 50 mg, 50 mg, Oral, DAILY, VCharlaine Dalton MD, 50 mg at 05/09/19 08657  ?  methocarbamoL (ROBAXIN) tablet 500 mg, 500 mg, Oral, QID, VCharlaine Dalton MD, 500 mg at 05/10/19 08469  ?  montelukast (SINGULAIR) tablet 10 mg, 10 mg, Oral, QHS, VCharlaine Dalton MD, 10 mg at 05/09/19 2127   ?  rosuvastatin (CRESTOR) tablet 20 mg, 20 mg, Oral, QHS, VCharlaine Dalton MD, 20 mg at 05/09/19 2127   ?  spironolactone (ALDACTONE) tablet 100 mg, 100 mg, Oral, DAILY, VCharlaine Dalton MD, 100 mg at 05/10/19 06295  ?  tiotropium bromide (SPIRIVA RESPIMAT) 2.5 mcg /Benay Pike 1 PZeigler Inhalation, DAILY, VCharlaine Dalton MD, 1 Puff at 05/10/19 [id number]  ?  traZODone (DESYREL) tablet 50 mg, 50 mg, Oral, QHS PRN, VCharlaine Dalton MD, 50 mg at 05/10/19 0049   ?  sodium chloride (NS) flush 5-10 mL, 5-10 mL, IntraVENous, Q8H, VCharlaine Dalton MD, 10 mL at 05/10/19 03244  ?  sodium chloride (NS) flush 5-10 mL, 5-10 mL, IntraVENous, PRN, VCharlaine Dalton MD   ?  naloxone ([facility] injection 0.1 mg, 0.1 mg, IntraVENous, PRN, VCharlaine Dalton MD   ?  acetaminophen (TYLENOL) tablet 650 mg, 650 mg, Oral, Q6H PRN, VCharlaine Dalton MD, 650 mg at 05/08/19 0601   ?  morphine injection 1 mg, 1 mg, IntraVENous, Q4H PRN, VCharlaine Dalton MD, 1 mg at 05/10/19 00102  ?  alum-mag  hydroxide-simeth (MYLANTA) oral suspension 30 mL, 30 mL, Oral, Q4H PRN, Charlaine Dalton, MD   ?  albuterol (PROVENTIL VENTOLIN) nebulizer solution 2.5 mg, 2.5 mg, Nebulization, Q6H PRN, Charlaine Dalton, MD   ?  heparin (porcine) injection 5,000 Units, 5,000 Units, SubCUTAneous, Q8H, Charlaine Dalton, MD, 5,000 Units at 05/10/19 0610   ?  hydrALAZINE (APRESOLINE) 20 mg/mL injection 10 mg, 10 mg, IntraVENous, Q6H PRN, Charlaine Dalton, MD, 10 mg at 05/08/19 1108   ?  dextrose (D50) infusion 5-25 g, 10-50 mL,  IntraVENous, PRN, Charlaine Dalton, MD   ?  glucagon (GLUCAGEN) injection 1 mg, 1 mg, IntraMUSCular, PRN, Charlaine Dalton, MD   ?  insulin glargine (LANTUS) injection 1-100 Units, 1-100 Units, SubCUTAneous, QHS, Charlaine Dalton, MD, 15 Units at 05/09/19 2132   ?  insulin lispro (HUMALOG) injection 1-100 Units, 1-100 Units, SubCUTAneous, AC&HS, Charlaine Dalton, MD, 5 Units at 05/10/19 8841   ?  insulin lispro (HUMALOG) injection 1-100 Units, 1-100 Units, SubCUTAneous, PRN, Charlaine Dalton, MD     Labs:          Recent Results (from the past 24 hour(s))     GLUCOSE, POC          Collection Time: 05/09/19  5:10 PM         Result  Value  Ref Range            Glucose (POC)  128 (H)  65 - 105 mg/dL       CULTURE, URINE          Collection Time: 05/09/19  8:13 PM       Specimen: Clean catch; Urine         Result  Value  Ref Range            Culture result  Too Young To Read          GLUCOSE, POC          Collection Time: 05/09/19  9:21 PM         Result  Value  Ref Range            Glucose (POC)  178 (H)  65 - 105 mg/dL       GLUCOSE, POC          Collection Time: 05/10/19  8:23 AM         Result  Value  Ref Range            Glucose (POC)  144 (H)  65 - 105 mg/dL       CBC W/O DIFF          Collection Time: 05/10/19  8:37 AM         Result  Value  Ref Range            WBC  15.9 (H)  4.0 - 11.0 1000/mm3       RBC  4.21  3.60 - 5.20 M/uL       HGB  12.6 (L)  13.0 - 17.2 gm/dl       HCT  37.6  37.0 - 50.0 %       MCV  89.3  80.0 - 98.0 fL       MCH  29.9  25.4 - 34.6 pg       MCHC  33.5  30.0 - 36.0 gm/dl       PLATELET  204  140 - 450 1000/mm3       MPV  10.9 (H)  6.0 - 10.0 fL       RDW-SD  45.2  36.4 - 18.2         METABOLIC PANEL, COMPREHENSIVE          Collection Time: 05/10/19  8:37 AM         Result  Value  Ref Range            Sodium  135 (L)  136 - 145 mEq/L       Potassium  4.4  3.5 - 5.1 mEq/L       Chloride  104  98 - 107 mEq/L       CO2  25  21 - 32 mEq/L       Glucose  136 (H)  74 - 106 mg/dl       BUN  51 (H)  7 - 25  mg/dl       Creatinine  1.6 (H)  0.6 - 1.3 mg/dl       GFR est AA  41.0          GFR est non-AA  34          Calcium  8.5  8.5 - 10.1 mg/dl       AST (SGOT)  8 (L)  15 - 37 U/L       ALT (SGPT)  16  12 - 78 U/L       Alk. phosphatase  74  45 - 117 U/L       Bilirubin, total  0.4  0.2 - 1.0 mg/dl       Protein, total  7.3  6.4 - 8.2 gm/dl       Albumin  3.4  3.4 - 5.0 gm/dl       Anion gap  6  5 - 15 mmol/L       GLUCOSE, POC          Collection Time: 05/10/19 11:51 AM         Result  Value  Ref Range            Glucose (POC)  191 (H)  65 - 105 mg/dL          Radiology     No results found.       Portions of this electronic record were dictated using Systems analyst. Unintended errors in translation may occur.         Holland Falling, MD   [facility]   May 10, 2019   7:50 AM

## 2019-05-10 NOTE — Progress Notes (Signed)
Problem: Falls - Risk of  Goal: *Absence of Falls  Description: Document [name] [name] Fall Risk and appropriate interventions in the flowsheet.  Outcome: Progressing Towards Goal  Note: Fall Risk Interventions:  Mobility Interventions: Assess mobility with egress test, Bed/chair exit alarm, Patient to call before getting OOB    Mentation Interventions: Adequate sleep, hydration, pain control, Bed/chair exit alarm, Door open when patient unattended    Medication Interventions: Bed/chair exit alarm, Patient to call before getting OOB                   Problem: Patient Education: Go to Patient Education Activity  Goal: Patient/Family Education  Outcome: Progressing Towards Goal     Problem: Gas Exchange - Impaired  Goal: *Absence of hypoxia  Outcome: Progressing Towards Goal

## 2019-05-11 LAB — BASIC METABOLIC PANEL
Anion Gap: 4 mmol/L — ABNORMAL LOW (ref 5–15)
BUN: 30 mg/dl — ABNORMAL HIGH (ref 7–25)
CO2: 30 mEq/L (ref 21–32)
Calcium: 9 mg/dl (ref 8.5–10.1)
Chloride: 105 mEq/L (ref 98–107)
Creatinine: 1 mg/dl (ref 0.6–1.3)
EGFR IF NonAfrican American: 59
GFR African American: >60
Glucose: 171 mg/dl — ABNORMAL HIGH (ref 74–106)
Potassium: 4.6 mEq/L (ref 3.5–5.1)
Sodium: 139 mEq/L (ref 136–145)

## 2019-05-11 LAB — CBC
Hematocrit: 37 % (ref 37.0–50.0)
Hemoglobin: 12.5 gm/dl — ABNORMAL LOW (ref 13.0–17.2)
MCH: 30.3 pg (ref 25.4–34.6)
MCHC: 33.8 gm/dl (ref 30.0–36.0)
MCV: 89.6 fL (ref 80.0–98.0)
MPV: 11 fL — ABNORMAL HIGH (ref 6.0–10.0)
Platelets: 198 10*3/uL (ref 140–450)
RBC: 4.13 M/uL (ref 3.60–5.20)
RDW-SD: 44.1 (ref 36.4–46.3)
WBC: 10.1 10*3/uL (ref 4.0–11.0)

## 2019-05-11 LAB — POCT GLUCOSE
POC Glucose: 159 mg/dL — ABNORMAL HIGH (ref 65–105)
POC Glucose: 155 mg/dL — ABNORMAL HIGH (ref 65–105)
POC Glucose: 155 mg/dL — ABNORMAL HIGH (ref 65–105)
POC Glucose: 210 mg/dL — ABNORMAL HIGH (ref 65–105)

## 2019-05-11 LAB — GLUCOSE, POC
Glucose (POC): 159 mg/dL — ABNORMAL HIGH (ref 65–105)
Glucose (POC): 155 mg/dL — ABNORMAL HIGH (ref 65–105)
Glucose (POC): 155 mg/dL — ABNORMAL HIGH (ref 65–105)
Glucose (POC): 210 mg/dL — ABNORMAL HIGH (ref 65–105)

## 2019-05-11 LAB — CBC W/O DIFF
HCT: 37 % (ref 37.0–50.0)
HGB: 12.5 gm/dl — ABNORMAL LOW (ref 13.0–17.2)
MCH: 30.3 pg (ref 25.4–34.6)
MCHC: 33.8 gm/dl (ref 30.0–36.0)
MCV: 89.6 fL (ref 80.0–98.0)
MPV: 11 fL — ABNORMAL HIGH (ref 6.0–10.0)
PLATELET: 198 10*3/uL (ref 140–450)
RBC: 4.13 M/uL (ref 3.60–5.20)
RDW-SD: 44.1 (ref 36.4–46.3)
WBC: 10.1 10*3/uL (ref 4.0–11.0)

## 2019-05-11 LAB — METABOLIC PANEL, BASIC
Anion gap: 4 mmol/L — ABNORMAL LOW (ref 5–15)
BUN: 30 mg/dl — ABNORMAL HIGH (ref 7–25)
CO2: 30 mEq/L (ref 21–32)
Calcium: 9 mg/dl (ref 8.5–10.1)
Chloride: 105 mEq/L (ref 98–107)
Creatinine: 1 mg/dl (ref 0.6–1.3)
GFR est AA: >60
GFR est non-AA: 59
Glucose: 171 mg/dl — ABNORMAL HIGH (ref 74–106)
Potassium: 4.6 mEq/L (ref 3.5–5.1)
Sodium: 139 mEq/L (ref 136–145)

## 2019-05-11 MED ORDER — PREDNISONE 20 MG TAB
20 mg | Freq: Every day | ORAL | Status: DC
Start: 2019-05-11 — End: 2019-05-13
  Administered 2019-05-11 – 2019-05-13 (×3): via ORAL

## 2019-05-11 MED FILL — METHOCARBAMOL 500 MG TAB: 500 mg | ORAL | Qty: 1

## 2019-05-11 MED FILL — MONTELUKAST 10 MG TAB: 10 mg | ORAL | Qty: 1

## 2019-05-11 MED FILL — MORPHINE 2 MG/ML INJECTION: 2 mg/mL | INTRAMUSCULAR | Qty: 1

## 2019-05-11 MED FILL — GABAPENTIN 300 MG CAP: 300 mg | ORAL | Qty: 1

## 2019-05-11 MED FILL — PANTOPRAZOLE 40 MG IV SOLR: 40 mg | INTRAVENOUS | Qty: 40

## 2019-05-11 MED FILL — SODIUM CHLORIDE 0.9 % IJ SYRG: INTRAMUSCULAR | Qty: 10

## 2019-05-11 MED FILL — CETIRIZINE 5 MG TAB: 5 mg | ORAL | Qty: 2

## 2019-05-11 MED FILL — CLONIDINE 0.1 MG TAB: 0.1 mg | ORAL | Qty: 1

## 2019-05-11 MED FILL — PREDNISONE 20 MG TAB: 20 mg | ORAL | Qty: 1

## 2019-05-11 MED FILL — HYDROCODONE-ACETAMINOPHEN 5 MG-325 MG TAB: 5-325 mg | ORAL | Qty: 2

## 2019-05-11 MED FILL — DILTIAZEM ER 240 MG 24 HR CAP: 240 mg | ORAL | Qty: 1

## 2019-05-11 MED FILL — HEPARIN (PORCINE) 5,000 UNIT/ML IJ SOLN: 5000 unit/mL | INTRAMUSCULAR | Qty: 1

## 2019-05-11 MED FILL — MUCINEX 600 MG TABLET, EXTENDED RELEASE: 600 mg | ORAL | Qty: 1

## 2019-05-11 MED FILL — SPIRONOLACTONE 25 MG TAB: 25 mg | ORAL | Qty: 4

## 2019-05-11 MED FILL — ROSUVASTATIN 20 MG TAB: 20 mg | ORAL | Qty: 1

## 2019-05-11 NOTE — Progress Notes (Signed)
INTERNAL MEDICINE PROGRESS NOTE  Patient: [name] [name]   Date of Birth: 01-28-1951   MRN: [id number]      Hospital course / Assessment    Principle Problems:  Acute COPD exacerbation - treated with Duoneb's, IV steroid, c/w breo,singular, Spiriva    Acute hypoxic respiratory failure due to the above   Abdominal pain - unclear aetiology , may related to nephrolithiasis, no acute pathology   Acute kidney injury - Cr 16, treated with IVF support and avoiding nephrotoxin     Active Problems:  Hypertension on Cardizem, clonidine, aldactone , losartan on hold    Type 2 DM - on glu-commander   Hyperlipidemia on statin   Peripheral neuropathy on gabapentin   History of tobacco abuse  Pulmonary nodule  Bullous emphysema  Chronic pain syndrome   GERD on PPI     Code Status: Full code   DVT prophylaxis: pharmacologic and mechanical  Recommendation and Plan:   Continue with breathing Rx and Po prednisone   Monitor renal function       Disposition    Disposition: home     Subjective / ROS:   Patient states feel better, no fever or chills, still having abd pain       Medical Decision Making   Chart, Images and Lab data reviewed, necessary medical Orders placed   Discussed with nursing staff     Vitals:    05/10/19 1947 05/10/19 2321 05/11/19 0411 05/11/19 0747   BP: 106/60 116/73 110/69 107/86   Pulse: 82 82 74 (!) 123   Resp: Temp: 98.3 ??F (36.8 ??C) 98.7 ??F (37.1 ??C) 99.1 ??F (37.3 ??C) 98.8 ??F (37.1 ??C)   SpO2: 97% 98% 98% 91%   Weight:   99 kg (218 lb 4.1 oz)    Height:         Temp (24hrs), Avg:98.6 ??F (37 ??C), Min:98.2 ??F (36.8 ??C), Max:99.1 ??F (37.3 ??C)      Intake/Output Summary (Last 24 hours) at 05/11/2019 1130  Last data filed at 05/11/2019 0411  Gross per 24 hour   Intake 240 ml   Output 2600 ml   Net -2360 ml       Physical Exam:   General Appearance:   Appears in no acute distress.,  HEENT:   Moist oral mucous membranes, conjunctiva clear,   Neck:   Supple   Lungs:    Mild  wheezes., No rales., Normal respiratory effort,   Heart:   Regular rate and rhythm  Abdomen:   Soft , Non-distended and Non-tender,   Extremities:   - edema of legs  Neuro:   alert, oriented, moves all extremities well    Current medications:     Current Facility-Administered Medications   Medication Dose Route Frequency   ??? lactated Ringers infusion  75 mL/hr IntraVENous CONTINUOUS   ??? ondansetron (ZOFRAN) injection 4 mg  4 mg IntraVENous Q4H PRN   ??? HYDROcodone-acetaminophen (NORCO) 5-325 mg per tablet 1-2 Tab  1-2 Tab Oral Q4H PRN   ??? pantoprazole (PROTONIX) 40 mg in 0.9% sodium chloride 10 mL injection  40 mg IntraVENous DAILY   ??? cetirizine (ZYRTEC) tablet 10 mg  10 mg Oral DAILY   ??? cloNIDine HCL (CATAPRES) tablet 0.3 mg  0.3 mg Oral BID   ??? dilTIAZem ER (CARDIZEM CD) capsule 240 mg  240 mg Oral DAILY   ??? fluticasone-vilanterol (BREO ELLIPTA) 165mcg-25mcg/puff  1 Puff Inhalation DAILY   ??? gabapentin (  NEURONTIN) capsule 300 mg  300 mg Oral TID   ??? guaiFENesin ER (MUCINEX) tablet 600 mg  600 mg Oral Q12H   ??? [Held by provider] losartan (COZAAR) tablet 50 mg  50 mg Oral DAILY   ??? methocarbamoL (ROBAXIN) tablet 500 mg  500 mg Oral QID   ??? montelukast (SINGULAIR) tablet 10 mg  10 mg Oral QHS   ??? rosuvastatin (CRESTOR) tablet 20 mg  20 mg Oral QHS   ??? spironolactone (ALDACTONE) tablet 100 mg  100 mg Oral DAILY   ??? tiotropium bromide (SPIRIVA RESPIMAT) 2.5 mcg /actuation  1 Puff Inhalation DAILY   ??? traZODone (DESYREL) tablet 50 mg  50 mg Oral QHS PRN   ??? sodium chloride (NS) flush 5-10 mL  5-10 mL IntraVENous Q8H   ??? sodium chloride (NS) flush 5-10 mL  5-10 mL IntraVENous PRN   ??? naloxone (NARCAN) injection 0.1 mg  0.1 mg IntraVENous PRN   ??? acetaminophen (TYLENOL) tablet 650 mg  650 mg Oral Q6H PRN   ??? morphine injection 1 mg  1 mg IntraVENous Q4H PRN   ??? alum-mag hydroxide-simeth (MYLANTA) oral suspension 30 mL  30 mL Oral Q4H PRN   ??? albuterol (PROVENTIL VENTOLIN) nebulizer solution 2.5 mg  2.5 mg  Nebulization Q6H PRN   ??? heparin (porcine) injection 5,000 Units  5,000 Units SubCUTAneous Q8H   ??? hydrALAZINE (APRESOLINE) 20 mg/mL injection 10 mg  10 mg IntraVENous Q6H PRN   ??? dextrose (D50) infusion 5-25 g  10-50 mL IntraVENous PRN   ??? glucagon (GLUCAGEN) injection 1 mg  1 mg IntraMUSCular PRN   ??? insulin glargine (LANTUS) injection 1-100 Units  1-100 Units SubCUTAneous QHS   ??? insulin lispro (HUMALOG) injection 1-100 Units  1-100 Units SubCUTAneous AC&HS   ??? insulin lispro (HUMALOG) injection 1-100 Units  1-100 Units SubCUTAneous PRN          Laboratory and Radiology Data :      Ferritin   Date Value Ref Range Status   03/07/2019 21.3 8.0 - 252.0 ng/ml Final     WBC   Date Value Ref Range Status   05/11/2019 10.1 4.0 - 11.0 1000/mm3 Final     No results found for: NAU  No results found for: UEO    Microbiology    GRAM STAIN   Date Value Ref Range Status   08/24/2018 (A)   Final    Rare Gram Positive Cocci In Clusters  Rare Gram Positive Bacilli  Few Gram Positive Cocci In Chains  <10 Epithelial cells/lpf  10 - 25 WBC's/lpf  Mucus Present         Recent Results (from the past 24 hour(s))   GLUCOSE, POC    Collection Time: 05/10/19 11:51 AM   Result Value Ref Range    Glucose (POC) 191 (H) 65 - 105 mg/dL   CULTURE, BLOOD    Collection Time: 05/10/19 12:03 PM    Specimen: Peripheral blood   Result Value Ref Range    Blood Culture Result Culture In Progress, Daily Updates To Follow     CULTURE, BLOOD    Collection Time: 05/10/19 12:13 PM    Specimen: Peripheral blood   Result Value Ref Range    Blood Culture Result Culture In Progress, Daily Updates To Follow     GLUCOSE, POC    Collection Time: 05/10/19  6:30 PM   Result Value Ref Range    Glucose (POC) 189 (H) 65 - 105 mg/dL  GLUCOSE, POC    Collection Time: 05/10/19  9:25 PM   Result Value Ref Range    Glucose (POC) 159 (H) 65 - 105 mg/dL   GLUCOSE, POC    Collection Time: 05/11/19  7:46 AM   Result Value Ref Range    Glucose (POC) 155 (H) 65 - 105 mg/dL    CBC W/O DIFF    Collection Time: 05/11/19 11:00 AM   Result Value Ref Range    WBC 10.1 4.0 - 11.0 1000/mm3    RBC 4.13 3.60 - 5.20 M/uL    HGB 12.5 (L) 13.0 - 17.2 gm/dl    HCT 45.6 25.6 - 38.9 %    MCV 89.6 80.0 - 98.0 fL    MCH 30.3 25.4 - 34.6 pg    MCHC 33.8 30.0 - 36.0 gm/dl    PLATELET 373 428 - 768 1000/mm3    MPV 11.0 (H) 6.0 - 10.0 fL    RDW-SD 44.1 36.4 - 46.3         XR Results:  Results from Hospital Encounter encounter on 05/07/19   XR CHEST SNGL V    Narrative EXAM:  Chest AP    INDICATIONS: SOB, hypoxia     COMPARISON: Most recently 03/13/2019.    FINDINGS:     There is an unchanged density in the left midlung zone just cranial to the known  left lower lobe nodule. This was shown to be atelectasis on previous CT. The  nodule is also unchanged in size. No new abnormalities are prominent bullous  disease at the left lung apex.    Normal cardiomediastinal contours.      Impression IMPRESSION:   1.  No acute cardiopulmonary process.  2.  Stable atelectasis and nodule in the left midlung zone.  3.  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         CT Results:  Results from Hospital Encounter encounter on 05/07/19   CT ABD PELV W CONT    Narrative DICOM format image data is available to non-affiliated external healthcare  facilities or entities on a secure, media free, reciprocally searchable basis  with patient authorization for 12 months following the date of the study.    Clinical history:  Left lower quadrant pain, elevated blood blood cells    EXAMINATION:  CT scan of the abdomen and pelvis with intravenous contrast 05/07/2019. 5 mm  spiral scanning is performed from the costophrenic angles to the symphysis  pubis. Coronal and sagittal reconstruction imaging has been obtained.    Correlation: 05/11/2018 and 02/22/2018    FINDINGS:  There is respiratory motion artifact. There are bullous changes lung bases.  Osteopenia and degenerative changes of the spine. Right hip prosthesis. Liver,   gallbladder, spleen, adrenal glands are unremarkable.    1.6 and 1.1 cm splenic hypodensities unchanged from prior study. 2 mm  nonobstructing right nephrolithiasis. Left kidney and bladder are unremarkable.  Uterus is absent.    There are colonic diverticuli. Terminal ileum, appendix, stomach and small bowel  loops are within normal limits. Atherosclerotic calcification of the abdominal  aorta. No dominant lymph node enlargement or free fluid.      Impression IMPRESSION:  1. 2 mm nonobstructing right nephrolithiasis.  2. Small stable right renal cyst.  3. Diverticulosis.  4. Hysterectomy.  5. 1.6 and 1.1 cm stable splenic lesions.         MRI Results:  Results from Hospital Encounter encounter on 08/04/18   MRI BRAIN  WO CONT    Narrative EXAMINATION: MRI BRAIN WO CONT    CLINICAL INDICATION: intractable HA         COMPARISON:  April 09, 2015    TECHNIQUE: T1 and T2-weighted sequences of the brain were obtained in multiple  planes without contrast.    FINDINGS:   No restricted diffusion. Stable left posterior fossa dural based mass 10 mm,  abutting tentorium. Isointense on T1 and T2. Findings typical of meningioma.    Stable left frontal scalp subgaleal lipoma. Small right frontal subgaleal  lipoma.  No  no midline shift. No hydrocephalus. No extra-axial fluid collections.  Visualized portions of the orbits grossly unremarkable.  Mastoid air cells well aerated. Visualized portions of paranasal sinuses  demonstrate mucosal thickening posterior right ethmoid air cells and right  sphenoid sinus.    Intracranial flow voids of great vessels, grossly intact. Extensive confluent T2  prolongation in corona radiata and centrum semiovale, more than expected for  patient's age. Extensive subcortical T2 prolongation in the frontoparietal  lobes. T2 prolongation central pons.  Marked frontotemporal lobe atrophy. No susceptibility artifact on gradient echo  imaging to suggest hemorrhage.  Cerebellar tonsil normal.                Impression IMPRESSION:  1.   Stable left posterior fossa dural based mass. Long-term stability would be  consistent with meningioma.   2. No acute infarction.  3. Extensive chronic microvascular ischemic change         Nuclear Medicine Results:  No results found for this or any previous visit.    US Results:  Results from Hospital Encounter encounter on 12/24/17   US RUQ    Narrative Examination: Ultrasound right upper quadrant.    INDICATION: Pain.    FINDINGS:    The liver appears grossly unremarkable. No focal hepatic lesions definitely  identified.    The gallbladder appears unremarkable. No gallbladder wall thickening. No  sonographic Murphy's sign. CBD within normal limits roughly measuring 0.3 cm.    Right kidney appears unremarkable and is normal in size. A 4 mm nonobstructing  renal calculus. Tiny cyst on previous CT dated 08/27/2016, not well visualized on  the current exam.    Proximal aorta and IVC appear unremarkable.      Impression IMPRESSION:  1. No acute abnormalities.  2. No gallbladder wall thickening, gallstones or evidence of acute  cholecystitis.  3. Nonobstructing right renal calculus.         IR Results:  No results found for this or any previous visit.    VAS/US Results:  Results from Hospital Encounter encounter on 08/04/18   DUPLEX TEMPORAL ART RIGHT LTD    Narrative                                                               Study ID:   [id number]                                                 [facility]                                            [address] [place],                                          [place]                                   Temporal Artery Duplex Report     Name: GELILA, WELL          Study Date: 08/07/2018 09:54 AM  MRN: [id number]                    Patient Location: [id number]  DOB: 08-Dec-1950                Age: 38 yrs  Gender: Female                 Account #: [id number]  Reason For Study: Headaches  Ordering Physician: Marzella Schlein    Performed By: Rito Ehrlich, RVT    Interpretation Summary  Normal velocities were dected in all vessels.  No sign of halo detected around the parietal, frontal and temporal arteries.  _____________________________________________________________________________  __      Quality/Procedure  Limited color duplex ultrasound of the temporal arteries was performed. ICD  10: R51.    History/symptoms  Diabetes. HTN. COPD. Headaches.    Right Superficial Temporal Artery Velocities  RT: Proximal superficial temporal branch 39.6cm/s. RT: Mid superficial  temporal branch 45.3cm/s. RT: Distal superficial temporal branch 40.6cm/s.    Right Frontal Branch Velocities  RT: Distal frontal branch 21.2cm/s. RT: Mid frontal branch 15.3cm/s. RT:  Proximal frontal branch 27.8cm/s.      Right Parietal Branch Velocities  RT: Proximal parietal branch 38.4cm/s. RT: Mid parietal branch 24.9cm/s. RT:  Distal parietal branch 30.3cm/s.      Electronically signed byDr. Judithe Modest, M.D   08/07/2018 04:43 PM  Christy Sartorius M.D.  [facility]  Page [phone]

## 2019-05-11 NOTE — Progress Notes (Signed)
Gastrointestinal Progress Note    Patient Name: [name] [name]    ZHYQM'V Date: 05/11/2019    Admit Date: 05/07/2019    Assessment-Recommendation:   1. Abdominal pain. - Ok to discharge from GI standpoint. Continue PPI and antiemetics. Outpatient follow up.     Subjective:     Appears much improve since initial visit. No vomiting reported.     Current Facility-Administered Medications   Medication Dose Route Frequency   ??? predniSONE (DELTASONE) tablet 20 mg  20 mg Oral DAILY WITH BREAKFAST   ??? lactated Ringers infusion  75 mL/hr IntraVENous CONTINUOUS   ??? ondansetron (ZOFRAN) injection 4 mg  4 mg IntraVENous Q4H PRN   ??? HYDROcodone-acetaminophen (NORCO) 5-325 mg per tablet 1-2 Tab  1-2 Tab Oral Q4H PRN   ??? pantoprazole (PROTONIX) 40 mg in 0.9% sodium chloride 10 mL injection  40 mg IntraVENous DAILY   ??? cetirizine (ZYRTEC) tablet 10 mg  10 mg Oral DAILY   ??? cloNIDine HCL (CATAPRES) tablet 0.3 mg  0.3 mg Oral BID   ??? dilTIAZem ER (CARDIZEM CD) capsule 240 mg  240 mg Oral DAILY   ??? fluticasone-vilanterol (BREO ELLIPTA) 1106mcg-25mcg/puff  1 Puff Inhalation DAILY   ??? gabapentin (NEURONTIN) capsule 300 mg  300 mg Oral TID   ??? guaiFENesin ER (MUCINEX) tablet 600 mg  600 mg Oral Q12H   ??? [Held by provider] losartan (COZAAR) tablet 50 mg  50 mg Oral DAILY   ??? methocarbamoL (ROBAXIN) tablet 500 mg  500 mg Oral QID   ??? montelukast (SINGULAIR) tablet 10 mg  10 mg Oral QHS   ??? rosuvastatin (CRESTOR) tablet 20 mg  20 mg Oral QHS   ??? spironolactone (ALDACTONE) tablet 100 mg  100 mg Oral DAILY   ??? tiotropium bromide (SPIRIVA RESPIMAT) 2.5 mcg /actuation  1 Puff Inhalation DAILY   ??? traZODone (DESYREL) tablet 50 mg  50 mg Oral QHS PRN   ??? sodium chloride (NS) flush 5-10 mL  5-10 mL IntraVENous Q8H   ??? sodium chloride (NS) flush 5-10 mL  5-10 mL IntraVENous PRN   ??? naloxone (NARCAN) injection 0.1 mg  0.1 mg IntraVENous PRN   ??? acetaminophen (TYLENOL) tablet 650 mg  650 mg Oral Q6H PRN    ??? morphine injection 1 mg  1 mg IntraVENous Q4H PRN   ??? alum-mag hydroxide-simeth (MYLANTA) oral suspension 30 mL  30 mL Oral Q4H PRN   ??? albuterol (PROVENTIL VENTOLIN) nebulizer solution 2.5 mg  2.5 mg Nebulization Q6H PRN   ??? heparin (porcine) injection 5,000 Units  5,000 Units SubCUTAneous Q8H   ??? hydrALAZINE (APRESOLINE) 20 mg/mL injection 10 mg  10 mg IntraVENous Q6H PRN   ??? dextrose (D50) infusion 5-25 g  10-50 mL IntraVENous PRN   ??? glucagon (GLUCAGEN) injection 1 mg  1 mg IntraMUSCular PRN   ??? insulin glargine (LANTUS) injection 1-100 Units  1-100 Units SubCUTAneous QHS   ??? insulin lispro (HUMALOG) injection 1-100 Units  1-100 Units SubCUTAneous AC&HS   ??? insulin lispro (HUMALOG) injection 1-100 Units  1-100 Units SubCUTAneous PRN          Objective:     Physical Exam:    Obese black woman, NAD  Abdomen soft NT +BS    Data Review:    Labs: Results:       Chemistry Recent Labs     05/11/19  1100 05/10/19  0837 05/09/19  0341   GLU 171* 136* 156*   NA 139 135* 135*  K 4.6 4.4 4.8   CL 105 104 101   CO2 30 25 26    BUN 30* 51* 37*   CREA 1.0 1.6* 1.4*   CA 9.0 8.5 9.7   AGAP 4* 6 8   AP  --  74  --    TP  --  7.3  --    ALB  --  3.4  --       CBC w/Diff Recent Labs     05/11/19  1100 05/10/19  0837 05/09/19  0341   WBC 10.1 15.9* 12.8*   RBC 4.13 4.21 4.65   HGB 12.5* 12.6* 13.9   HCT 37.0 37.6 41.7   PLT 198 204 247   GRANS  --   --  77.7*   LYMPH  --   --  9.7*   EOS  --   --  0.0      Coagulation No results for input(s): PTP, INR, APTT, INREXT in the last 72 hours.    Liver Enzymes Recent Labs     05/10/19  0837   TP 7.3   ALB 3.4   AP 74   ALT 16          05/12/19, MD  May 11, 2019

## 2019-05-11 NOTE — Progress Notes (Signed)
Problem: Falls - Risk of  Goal: *Absence of Falls  Description: Document Schmid Fall Risk and appropriate interventions in the flowsheet.  Outcome: Progressing Towards Goal  Note: Fall Risk Interventions:  Mobility Interventions: Patient to call before getting OOB, Bed/chair exit alarm    Mentation Interventions: Adequate sleep, hydration, pain control, Bed/chair exit alarm    Medication Interventions: Bed/chair exit alarm, Patient to call before getting OOB                   Problem: Patient Education: Go to Patient Education Activity  Goal: Patient/Family Education  Outcome: Progressing Towards Goal     Problem: Gas Exchange - Impaired  Goal: *Absence of hypoxia  Outcome: Progressing Towards Goal     Problem: Patient Education: Go to Patient Education Activity  Goal: Patient/Family Education  Outcome: Progressing Towards Goal     Problem: Risk for Spread of Infection  Goal: Prevent transmission of infectious organism to others  Description: Prevent the transmission of infectious organisms to other patients, staff members, and visitors.  Outcome: Progressing Towards Goal     Problem: Patient Education:  Go to Education Activity  Goal: Patient/Family Education  Outcome: Progressing Towards Goal

## 2019-05-11 NOTE — Progress Notes (Signed)
Progress  Notes by Willaim Rayasivera, Ilario Dhaliwal J, MD at 05/11/19 1312                Author: Willaim Rayasivera, Braelyn Bordonaro J, MD  Service: Gastroenterology  Author Type: Physician       Filed: 05/11/19 1314  Date of Service: 05/11/19 1312  Status: Signed          Editor: Willaim Rayasivera, Rafay Dahan J, MD (Physician)                       Gastrointestinal Progress Note      Patient Name: [name] [name]      Today's Date: 05/11/2019      Admit Date: 05/07/2019        Assessment-Recommendation:     1. Abdominal pain. - Ok to discharge from GI standpoint. Continue PPI and antiemetics. Outpatient follow up.         Subjective:        Appears much improve since initial visit. No vomiting reported.         Current Facility-Administered Medications          Medication  Dose  Route  Frequency           ?  predniSONE (DELTASONE) tablet 20 mg   20 mg  Oral  DAILY WITH BREAKFAST     ?  lactated Ringers infusion   75 mL/hr  IntraVENous  CONTINUOUS     ?  ondansetron (ZOFRAN) injection 4 mg   4 mg  IntraVENous  Q4H PRN     ?  HYDROcodone-acetaminophen (NORCO) 5-325 mg per tablet 1-2 Tab   1-2 Tab  Oral  Q4H PRN     ?  pantoprazole (PROTONIX) 40 mg in 0.9% sodium chloride 10 mL injection   40 mg  IntraVENous  DAILY     ?  cetirizine (ZYRTEC) tablet 10 mg   10 mg  Oral  DAILY     ?  cloNIDine HCL (CATAPRES) tablet 0.3 mg   0.3 mg  Oral  BID     ?  dilTIAZem ER (CARDIZEM CD) capsule 240 mg   240 mg  Oral  DAILY     ?  fluticasone-vilanterol (BREO ELLIPTA) 15700mcg-25mcg/puff   1 Puff  Inhalation  DAILY           ?  gabapentin (NEURONTIN) capsule 300 mg   300 mg  Oral  TID           ?  guaiFENesin ER (MUCINEX) tablet 600 mg   600 mg  Oral  Q12H     ?  [Held by provider] losartan (COZAAR) tablet 50 mg   50 mg  Oral  DAILY     ?  methocarbamoL (ROBAXIN) tablet 500 mg   500 mg  Oral  QID     ?  montelukast (SINGULAIR) tablet 10 mg   10 mg  Oral  QHS     ?  rosuvastatin (CRESTOR) tablet 20 mg   20 mg  Oral  QHS     ?  spironolactone (ALDACTONE) tablet 100 mg   100 mg  Oral  DAILY      ?  tiotropium bromide (SPIRIVA RESPIMAT) 2.5 mcg /actuation   1 Puff  Inhalation  DAILY     ?  traZODone (DESYREL) tablet 50 mg   50 mg  Oral  QHS PRN     ?  sodium chloride (NS) flush 5-10 mL   5-10 mL  IntraVENous  Q8H     ?  sodium chloride (NS) flush 5-10 mL   5-10 mL  IntraVENous  PRN     ?  naloxone (NARCAN) injection 0.1 mg   0.1 mg  IntraVENous  PRN     ?  acetaminophen (TYLENOL) tablet 650 mg   650 mg  Oral  Q6H PRN     ?  morphine injection 1 mg   1 mg  IntraVENous  Q4H PRN     ?  alum-mag hydroxide-simeth (MYLANTA) oral suspension 30 mL   30 mL  Oral  Q4H PRN     ?  albuterol (PROVENTIL VENTOLIN) nebulizer solution 2.5 mg   2.5 mg  Nebulization  Q6H PRN     ?  heparin (porcine) injection 5,000 Units   5,000 Units  SubCUTAneous  Q8H     ?  hydrALAZINE (APRESOLINE) 20 mg/mL injection 10 mg   10 mg  IntraVENous  Q6H PRN     ?  dextrose (D50) infusion 5-25 g   10-50 mL  IntraVENous  PRN     ?  glucagon (GLUCAGEN) injection 1 mg   1 mg  IntraMUSCular  PRN           ?  insulin glargine (LANTUS) injection 1-100 Units   1-100 Units  SubCUTAneous  QHS           ?  insulin lispro (HUMALOG) injection 1-100 Units   1-100 Units  SubCUTAneous  AC&HS           ?  insulin lispro (HUMALOG) injection 1-100 Units   1-100 Units  SubCUTAneous  PRN                 Objective:        Physical Exam:      Obese black woman, NAD   Abdomen soft NT +BS      Data Review:         Labs:  Results:                  Chemistry  Recent Labs         05/11/19   1100  05/10/19   0837  05/09/19   0341      GLU  171*  136*  156*      NA  139  135*  135*      K  4.6  4.4  4.8      CL  105  104  101      CO2  30  25  26       BUN  30*  51*  37*      CREA  1.0  1.6*  1.4*      CA  9.0  8.5  9.7      AGAP  4*  6  8      AP   --   74   --       TP   --   7.3   --       ALB   --   3.4   --               CBC w/Diff  Recent Labs         05/11/19   1100  05/10/19   0837  05/09/19   0341      WBC  10.1  15.9*  12.8*      RBC  4.13  4.21  4.65      HGB   12.5*  12.6*  13.9      HCT  37.0  37.6  41.7      PLT  198  204  247      GRANS   --    --   77.7*      LYMPH   --    --   9.7*      EOS   --    --   0.0              Coagulation  No results for input(s): PTP, INR, APTT, INREXT in the last 72 hours.         Liver Enzymes  Recent Labs         05/10/19   0837      TP  7.3      ALB  3.4      AP  74      ALT  16                    Jackalyn Lombard, MD   May 11, 2019

## 2019-05-11 NOTE — Progress Notes (Signed)
INTERNAL MEDICINE PROGRESS NOTE  Patient: [name] FantasiaMonica [name]   Date of Birth: 01/18/1940   MRN: [id number]      Hospital course / Assessment    Principle Problems:  Acute COPD exacerbation - treated with Duoneb's, IV steroid, c/w breo,singular, Spiriva    Acute hypoxic respiratory failure due to the above   Abdominal pain - unclear aetiology , may related to nephrolithiasis, no acute pathology   Acute kidney injury - Cr 16, treated with IVF support and avoiding nephrotoxin     Active Problems:  Hypertension on Cardizem, clonidine, aldactone , losartan on hold    Type 2 DM - on glu-commander   Hyperlipidemia on statin   Peripheral neuropathy on gabapentin   History of tobacco abuse  Pulmonary nodule  Bullous emphysema  Chronic pain syndrome   GERD on PPI     Code Status: Full code   DVT prophylaxis: pharmacologic and mechanical  Recommendation and Plan:   Continue with breathing Rx and Po prednisone   Monitor renal function       Disposition    Disposition: home     Subjective / ROS:   Patient states feel better, no fever or chills, still having abd pain       Medical Decision Making   Chart, Images and Lab data reviewed, necessary medical Orders placed   Discussed with nursing staff     Vitals:    05/10/19 1947 05/10/19 2321 05/11/19 0411 05/11/19 0747   BP: 106/60 116/73 110/69 107/86   Pulse: 82 82 74 (!) 123   Resp: 16 17 17 18    Temp: 98.3 ??F (36.8 ??C) 98.7 ??F (37.1 ??C) 99.1 ??F (37.3 ??C) 98.8 ??F (37.1 ??C)   SpO2: 97% 98% 98% 91%   Weight:   99 kg (218 lb 4.1 oz)    Height:         Temp (24hrs), Avg:98.6 ??F (37 ??C), Min:98.2 ??F (36.8 ??C), Max:99.1 ??F (37.3 ??C)      Intake/Output Summary (Last 24 hours) at 05/11/2019 1130  Last data filed at 05/11/2019 0411  Gross per 24 hour   Intake 240 ml   Output 2600 ml   Net -2360 ml       Physical Exam:   General Appearance:   Appears in no acute distress.,  HEENT:   Moist oral mucous membranes, conjunctiva clear,   Neck:   Supple  Lungs:    Mild  wheezes., No rales., Normal  respiratory effort,   Heart:   Regular rate and rhythm  Abdomen:   Soft , Non-distended and Non-tender,   Extremities:   - edema of legs  Neuro:   alert, oriented, moves all extremities well    Current medications:     Current Facility-Administered Medications   Medication Dose Route Frequency   ??? lactated Ringers infusion  75 mL/hr IntraVENous CONTINUOUS   ??? ondansetron (ZOFRAN) injection 4 mg  4 mg IntraVENous Q4H PRN   ??? HYDROcodone-acetaminophen (NORCO) 5-325 mg per tablet 1-2 Tab  1-2 Tab Oral Q4H PRN   ??? pantoprazole (PROTONIX) 40 mg in 0.9% sodium chloride 10 mL injection  40 mg IntraVENous DAILY   ??? cetirizine (ZYRTEC) tablet 10 mg  10 mg Oral DAILY   ??? cloNIDine HCL (CATAPRES) tablet 0.3 mg  0.3 mg Oral BID   ??? dilTIAZem ER (CARDIZEM CD) capsule 240 mg  240 mg Oral DAILY   ??? fluticasone-vilanterol (BREO ELLIPTA) 13800mcg-25mcg/puff  1 Puff Inhalation DAILY   ??? gabapentin (  NEURONTIN) capsule 300 mg  300 mg Oral TID   ??? guaiFENesin ER (MUCINEX) tablet 600 mg  600 mg Oral Q12H   ??? [Held by provider] losartan (COZAAR) tablet 50 mg  50 mg Oral DAILY   ??? methocarbamoL (ROBAXIN) tablet 500 mg  500 mg Oral QID   ??? montelukast (SINGULAIR) tablet 10 mg  10 mg Oral QHS   ??? rosuvastatin (CRESTOR) tablet 20 mg  20 mg Oral QHS   ??? spironolactone (ALDACTONE) tablet 100 mg  100 mg Oral DAILY   ??? tiotropium bromide (SPIRIVA RESPIMAT) 2.5 mcg /actuation  1 Puff Inhalation DAILY   ??? traZODone (DESYREL) tablet 50 mg  50 mg Oral QHS PRN   ??? sodium chloride (NS) flush 5-10 mL  5-10 mL IntraVENous Q8H   ??? sodium chloride (NS) flush 5-10 mL  5-10 mL IntraVENous PRN   ??? naloxone (NARCAN) injection 0.1 mg  0.1 mg IntraVENous PRN   ??? acetaminophen (TYLENOL) tablet 650 mg  650 mg Oral Q6H PRN   ??? morphine injection 1 mg  1 mg IntraVENous Q4H PRN   ??? alum-mag hydroxide-simeth (MYLANTA) oral suspension 30 mL  30 mL Oral Q4H PRN   ??? albuterol (PROVENTIL VENTOLIN) nebulizer solution 2.5 mg  2.5 mg Nebulization Q6H PRN   ??? heparin (porcine)  injection 5,000 Units  5,000 Units SubCUTAneous Q8H   ??? hydrALAZINE (APRESOLINE) 20 mg/mL injection 10 mg  10 mg IntraVENous Q6H PRN   ??? dextrose (D50) infusion 5-25 g  10-50 mL IntraVENous PRN   ??? glucagon (GLUCAGEN) injection 1 mg  1 mg IntraMUSCular PRN   ??? insulin glargine (LANTUS) injection 1-100 Units  1-100 Units SubCUTAneous QHS   ??? insulin lispro (HUMALOG) injection 1-100 Units  1-100 Units SubCUTAneous AC&HS   ??? insulin lispro (HUMALOG) injection 1-100 Units  1-100 Units SubCUTAneous PRN          Laboratory and Radiology Data :      Ferritin   Date Value Ref Range Status   03/07/2019 21.3 8.0 - 252.0 ng/ml Final     WBC   Date Value Ref Range Status   05/11/2019 10.1 4.0 - 11.0 1000/mm3 Final     No results found for: NAU  No results found for: UEO    Microbiology    GRAM STAIN   Date Value Ref Range Status   08/24/2018 (A)   Final    Rare Gram Positive Cocci In Clusters  Rare Gram Positive Bacilli  Few Gram Positive Cocci In Chains  <10 Epithelial cells/lpf  10 - 25 WBC's/lpf  Mucus Present         Recent Results (from the past 24 hour(s))   GLUCOSE, POC    Collection Time: 05/10/19 11:51 AM   Result Value Ref Range    Glucose (POC) 191 (H) 65 - 105 mg/dL   CULTURE, BLOOD    Collection Time: 05/10/19 12:03 PM    Specimen: Peripheral blood   Result Value Ref Range    Blood Culture Result Culture In Progress, Daily Updates To Follow     CULTURE, BLOOD    Collection Time: 05/10/19 12:13 PM    Specimen: Peripheral blood   Result Value Ref Range    Blood Culture Result Culture In Progress, Daily Updates To Follow     GLUCOSE, POC    Collection Time: 05/10/19  6:30 PM   Result Value Ref Range    Glucose (POC) 189 (H) 65 - 105 mg/dL  GLUCOSE, POC    Collection Time: 05/10/19  9:25 PM   Result Value Ref Range    Glucose (POC) 159 (H) 65 - 105 mg/dL   GLUCOSE, POC    Collection Time: 05/11/19  7:46 AM   Result Value Ref Range    Glucose (POC) 155 (H) 65 - 105 mg/dL   CBC W/O DIFF    Collection Time: 05/11/19  11:00 AM   Result Value Ref Range    WBC 10.1 4.0 - 11.0 1000/mm3    RBC 4.13 3.60 - 5.20 M/uL    HGB 12.5 (L) 13.0 - 17.2 gm/dl    HCT 16.1 09.6 - 04.5 %    MCV 89.6 80.0 - 98.0 fL    MCH 30.3 25.4 - 34.6 pg    MCHC 33.8 30.0 - 36.0 gm/dl    PLATELET 409 811 - 914 1000/mm3    MPV 11.0 (H) 6.0 - 10.0 fL    RDW-SD 44.1 36.4 - 46.3         XR Results:  Results from Hospital Encounter encounter on 05/07/19   XR CHEST SNGL V    Narrative EXAM:  Chest AP    INDICATIONS: SOB, hypoxia     COMPARISON: Most recently 03/13/2019.    FINDINGS:     There is an unchanged density in the left midlung zone just cranial to the known  left lower lobe nodule. This was shown to be atelectasis on previous CT. The  nodule is also unchanged in size. No new abnormalities are prominent bullous  disease at the left lung apex.    Normal cardiomediastinal contours.      Impression IMPRESSION:   1.  No acute cardiopulmonary process.  2.  Stable atelectasis and nodule in the left midlung zone.  3.  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         CT Results:  Results from Hospital Encounter encounter on 05/07/19   CT ABD PELV W CONT    Narrative DICOM format image data is available to non-affiliated external healthcare  facilities or entities on a secure, media free, reciprocally searchable basis  with patient authorization for 12 months following the date of the study.    Clinical history:  Left lower quadrant pain, elevated blood blood cells    EXAMINATION:  CT scan of the abdomen and pelvis with intravenous contrast 05/07/2019. 5 mm  spiral scanning is performed from the costophrenic angles to the symphysis  pubis. Coronal and sagittal reconstruction imaging has been obtained.    Correlation: 05/11/2018 and 02/22/2018    FINDINGS:  There is respiratory motion artifact. There are bullous changes lung bases.  Osteopenia and degenerative changes of the spine. Right hip prosthesis. Liver,  gallbladder, spleen, adrenal glands are  unremarkable.    1.6 and 1.1 cm splenic hypodensities unchanged from prior study. 2 mm  nonobstructing right nephrolithiasis. Left kidney and bladder are unremarkable.  Uterus is absent.    There are colonic diverticuli. Terminal ileum, appendix, stomach and small bowel  loops are within normal limits. Atherosclerotic calcification of the abdominal  aorta. No dominant lymph node enlargement or free fluid.      Impression IMPRESSION:  1. 2 mm nonobstructing right nephrolithiasis.  2. Small stable right renal cyst.  3. Diverticulosis.  4. Hysterectomy.  5. 1.6 and 1.1 cm stable splenic lesions.         MRI Results:  Results from Hospital Encounter encounter on 08/04/18   MRI BRAIN  WO CONT    Narrative EXAMINATION: MRI BRAIN WO CONT    CLINICAL INDICATION: intractable HA         COMPARISON:  April 09, 2015    TECHNIQUE: T1 and T2-weighted sequences of the brain were obtained in multiple  planes without contrast.    FINDINGS:   No restricted diffusion. Stable left posterior fossa dural based mass 10 mm,  abutting tentorium. Isointense on T1 and T2. Findings typical of meningioma.    Stable left frontal scalp subgaleal lipoma. Small right frontal subgaleal  lipoma.  No  no midline shift. No hydrocephalus. No extra-axial fluid collections.  Visualized portions of the orbits grossly unremarkable.  Mastoid air cells well aerated. Visualized portions of paranasal sinuses  demonstrate mucosal thickening posterior right ethmoid air cells and right  sphenoid sinus.    Intracranial flow voids of great vessels, grossly intact. Extensive confluent T2  prolongation in corona radiata and centrum semiovale, more than expected for  patient's age. Extensive subcortical T2 prolongation in the frontoparietal  lobes. T2 prolongation central pons.  Marked frontotemporal lobe atrophy. No susceptibility artifact on gradient echo  imaging to suggest hemorrhage.  Cerebellar tonsil normal.               Impression IMPRESSION:  1.   Stable  left posterior fossa dural based mass. Long-term stability would be  consistent with meningioma.   2. No acute infarction.  3. Extensive chronic microvascular ischemic change         Nuclear Medicine Results:  No results found for this or any previous visit.    [place] Results:  Results from Hospital Encounter encounter on 12/24/17   [place] RUQ    Narrative Examination: Ultrasound right upper quadrant.    INDICATION: Pain.    FINDINGS:    The liver appears grossly unremarkable. No focal hepatic lesions definitely  identified.    The gallbladder appears unremarkable. No gallbladder wall thickening. No  sonographic Murphy's sign. CBD within normal limits roughly measuring 0.3 cm.    Right kidney appears unremarkable and is normal in size. A 4 mm nonobstructing  renal calculus. Tiny cyst on previous CT dated 08/27/2016, not well visualized on  the current exam.    Proximal aorta and IVC appear unremarkable.      Impression IMPRESSION:  1. No acute abnormalities.  2. No gallbladder wall thickening, gallstones or evidence of acute  cholecystitis.  3. Nonobstructing right renal calculus.         IR Results:  No results found for this or any previous visit.    VAS/US Results:  Results from Hospital Encounter encounter on 08/04/18   DUPLEX TEMPORAL ART RIGHT LTD    Narrative                                                               Study ID:   [id number]                                                 [facility]  [facility]                                            [address] [place],                                          [place]                                   Temporal Artery Duplex Report    Name: HALI, BALGOBIN          Study Date: 08/07/2018 09:54  AM  MRN: [id number]                    Patient Location: [id number]  DOB: [birth year]                Age: 84 yrs  Gender: Female                 Account #: [id number]  Reason For Study: Headaches  Ordering Physician: Marzella Schlein    Performed By: Rito Ehrlich, RVT    Interpretation Summary  Normal velocities were dected in all vessels.  No sign of halo detected around the parietal, frontal and temporal arteries.  _____________________________________________________________________________  __      Quality/Procedure  Limited color duplex ultrasound of the temporal arteries was performed. ICD  10: R51.    History/symptoms  Diabetes. HTN. COPD. Headaches.    Right Superficial Temporal Artery Velocities  RT: Proximal superficial temporal branch 39.6cm/s. RT: Mid superficial  temporal branch 45.3cm/s. RT: Distal superficial temporal branch 40.6cm/s.    Right Frontal Branch Velocities  RT: Distal frontal branch 21.2cm/s. RT: Mid frontal branch 15.3cm/s. RT:  Proximal frontal branch 27.8cm/s.      Right Parietal Branch Velocities  RT: Proximal parietal branch 38.4cm/s. RT: Mid parietal branch 24.9cm/s. RT:  Distal parietal branch 30.3cm/s.      Electronically signed byDr. Judithe Modest, M.D   08/07/2018 04:43 PM  Christy Sartorius M.D.  [facility]  Page [phone]

## 2019-05-11 NOTE — Progress Notes (Signed)
Problem: Falls - Risk of  Goal: *Absence of Falls  Description: Document [name] Fall Risk and appropriate interventions in the flowsheet.  Outcome: Progressing Towards Goal  Note: Fall Risk Interventions:  Mobility Interventions: Patient to call before getting OOB, Bed/chair exit alarm    Mentation Interventions: Adequate sleep, hydration, pain control, Bed/chair exit alarm    Medication Interventions: Bed/chair exit alarm, Patient to call before getting OOB                   Problem: Patient Education: Go to Patient Education Activity  Goal: Patient/Family Education  Outcome: Progressing Towards Goal     Problem: Gas Exchange - Impaired  Goal: *Absence of hypoxia  Outcome: Progressing Towards Goal     Problem: Patient Education: Go to Patient Education Activity  Goal: Patient/Family Education  Outcome: Progressing Towards Goal     Problem: Risk for Spread of Infection  Goal: Prevent transmission of infectious organism to others  Description: Prevent the transmission of infectious organisms to other patients, staff members, and visitors.  Outcome: Progressing Towards Goal     Problem: Patient Education:  Go to Education Activity  Goal: Patient/Family Education  Outcome: Progressing Towards Goal

## 2019-05-12 LAB — POCT GLUCOSE
POC Glucose: 196 mg/dL — ABNORMAL HIGH (ref 65–105)
POC Glucose: 289 mg/dL — ABNORMAL HIGH (ref 65–105)
POC Glucose: 202 mg/dL — ABNORMAL HIGH (ref 65–105)
POC Glucose: 214 mg/dL — ABNORMAL HIGH (ref 65–105)

## 2019-05-12 LAB — CBC WITH AUTO DIFFERENTIAL
Basophils %: 0.1 % (ref 0–3)
Eosinophils %: 0.2 % (ref 0–5)
Hematocrit: 38.2 % (ref 37.0–50.0)
Hemoglobin: 12.6 gm/dl — ABNORMAL LOW (ref 13.0–17.2)
Immature Granulocytes: 0.5 % (ref 0.0–3.0)
Lymphocytes %: 7.5 % — ABNORMAL LOW (ref 28–48)
MCH: 29.5 pg (ref 25.4–34.6)
MCHC: 33 gm/dl (ref 30.0–36.0)
MCV: 89.5 fL (ref 80.0–98.0)
MPV: 11.4 fL — ABNORMAL HIGH (ref 6.0–10.0)
Monocytes %: 3.4 % (ref 1–13)
Neutrophils %: 88.3 % — ABNORMAL HIGH (ref 34–64)
Nucleated RBCs: 0 (ref 0–0)
Platelets: 231 10*3/uL (ref 140–450)
RBC: 4.27 M/uL (ref 3.60–5.20)
RDW-SD: 43.1 (ref 36.4–46.3)
WBC: 13 10*3/uL — ABNORMAL HIGH (ref 4.0–11.0)

## 2019-05-12 LAB — BASIC METABOLIC PANEL
Anion Gap: 7 mmol/L (ref 5–15)
BUN: 23 mg/dl (ref 7–25)
CO2: 24 mEq/L (ref 21–32)
Calcium: 9 mg/dl (ref 8.5–10.1)
Chloride: 103 mEq/L (ref 98–107)
Creatinine: 1.2 mg/dl (ref 0.6–1.3)
EGFR IF NonAfrican American: 48
GFR African American: 58
Glucose: 292 mg/dl — ABNORMAL HIGH (ref 74–106)
Potassium: 5.3 mEq/L — ABNORMAL HIGH (ref 3.5–5.1)
Sodium: 134 mEq/L — ABNORMAL LOW (ref 136–145)

## 2019-05-12 LAB — CULTURE, URINE
CULTURE RESULT: 80000
Culture result: 80000

## 2019-05-12 LAB — GLUCOSE, POC
Glucose (POC): 196 mg/dL — ABNORMAL HIGH (ref 65–105)
Glucose (POC): 289 mg/dL — ABNORMAL HIGH (ref 65–105)
Glucose (POC): 202 mg/dL — ABNORMAL HIGH (ref 65–105)
Glucose (POC): 214 mg/dL — ABNORMAL HIGH (ref 65–105)

## 2019-05-12 LAB — CBC WITH AUTOMATED DIFF
BASOPHILS: 0.1 % (ref 0–3)
EOSINOPHILS: 0.2 % (ref 0–5)
HCT: 38.2 % (ref 37.0–50.0)
HGB: 12.6 gm/dl — ABNORMAL LOW (ref 13.0–17.2)
IMMATURE GRANULOCYTES: 0.5 % (ref 0.0–3.0)
LYMPHOCYTES: 7.5 % — ABNORMAL LOW (ref 28–48)
MCH: 29.5 pg (ref 25.4–34.6)
MCHC: 33 gm/dl (ref 30.0–36.0)
MCV: 89.5 fL (ref 80.0–98.0)
MONOCYTES: 3.4 % (ref 1–13)
MPV: 11.4 fL — ABNORMAL HIGH (ref 6.0–10.0)
NEUTROPHILS: 88.3 % — ABNORMAL HIGH (ref 34–64)
NRBC: 0 (ref 0–0)
PLATELET: 231 10*3/uL (ref 140–450)
RBC: 4.27 M/uL (ref 3.60–5.20)
RDW-SD: 43.1 (ref 36.4–46.3)
WBC: 13 10*3/uL — ABNORMAL HIGH (ref 4.0–11.0)

## 2019-05-12 LAB — METABOLIC PANEL, BASIC
Anion gap: 7 mmol/L (ref 5–15)
BUN: 23 mg/dl (ref 7–25)
CO2: 24 mEq/L (ref 21–32)
Calcium: 9 mg/dl (ref 8.5–10.1)
Chloride: 103 mEq/L (ref 98–107)
Creatinine: 1.2 mg/dl (ref 0.6–1.3)
GFR est AA: 58
GFR est non-AA: 48
Glucose: 292 mg/dl — ABNORMAL HIGH (ref 74–106)
Potassium: 5.3 mEq/L — ABNORMAL HIGH (ref 3.5–5.1)
Sodium: 134 mEq/L — ABNORMAL LOW (ref 136–145)

## 2019-05-12 MED ORDER — DILTIAZEM ER 300 MG 24 HR CAP
300 mg | Freq: Every day | ORAL | Status: DC
Start: 2019-05-12 — End: 2019-05-13
  Administered 2019-05-13: 14:00:00 via ORAL

## 2019-05-12 MED ORDER — SENNOSIDES-DOCUSATE SODIUM 8.6 MG-50 MG TAB
Freq: Every day | ORAL | Status: DC
Start: 2019-05-12 — End: 2019-05-12

## 2019-05-12 MED ORDER — POLYETHYLENE GLYCOL 3350 17 GRAM (100 %) ORAL POWDER PACKET
17 gram | Freq: Every day | ORAL | Status: DC
Start: 2019-05-12 — End: 2019-05-13
  Administered 2019-05-12 – 2019-05-13 (×2): via ORAL

## 2019-05-12 MED ORDER — SENNOSIDES-DOCUSATE SODIUM 8.6 MG-50 MG TAB
ORAL | Status: AC
Start: 2019-05-12 — End: 2019-05-12
  Administered 2019-05-12: via ORAL

## 2019-05-12 MED FILL — MORPHINE 2 MG/ML INJECTION: 2 mg/mL | INTRAMUSCULAR | Qty: 1

## 2019-05-12 MED FILL — SPIRONOLACTONE 25 MG TAB: 25 mg | ORAL | Qty: 4

## 2019-05-12 MED FILL — GABAPENTIN 300 MG CAP: 300 mg | ORAL | Qty: 1

## 2019-05-12 MED FILL — SENOKOT-S 8.6 MG-50 MG TABLET: ORAL | Qty: 2

## 2019-05-12 MED FILL — METHOCARBAMOL 500 MG TAB: 500 mg | ORAL | Qty: 1

## 2019-05-12 MED FILL — PANTOPRAZOLE 40 MG IV SOLR: 40 mg | INTRAVENOUS | Qty: 40

## 2019-05-12 MED FILL — PREDNISONE 20 MG TAB: 20 mg | ORAL | Qty: 1

## 2019-05-12 MED FILL — HYDROCODONE-ACETAMINOPHEN 5 MG-325 MG TAB: 5-325 mg | ORAL | Qty: 2

## 2019-05-12 MED FILL — HEPARIN (PORCINE) 5,000 UNIT/ML IJ SOLN: 5000 unit/mL | INTRAMUSCULAR | Qty: 1

## 2019-05-12 MED FILL — DILTIAZEM ER 240 MG 24 HR CAP: 240 mg | ORAL | Qty: 1

## 2019-05-12 MED FILL — SODIUM CHLORIDE 0.9 % IJ SYRG: INTRAMUSCULAR | Qty: 50

## 2019-05-12 MED FILL — INSULIN LISPRO 100 UNIT/ML INJECTION: 100 unit/mL | SUBCUTANEOUS | Qty: 1

## 2019-05-12 MED FILL — MUCINEX 600 MG TABLET, EXTENDED RELEASE: 600 mg | ORAL | Qty: 1

## 2019-05-12 MED FILL — ROSUVASTATIN 20 MG TAB: 20 mg | ORAL | Qty: 1

## 2019-05-12 MED FILL — SODIUM CHLORIDE 0.9 % IJ SYRG: INTRAMUSCULAR | Qty: 10

## 2019-05-12 MED FILL — POLYETHYLENE GLYCOL 3350 17 GRAM (100 %) ORAL POWDER PACKET: 17 gram | ORAL | Qty: 1

## 2019-05-12 MED FILL — CETIRIZINE 5 MG TAB: 5 mg | ORAL | Qty: 2

## 2019-05-12 MED FILL — MONTELUKAST 10 MG TAB: 10 mg | ORAL | Qty: 1

## 2019-05-12 MED FILL — CLONIDINE 0.1 MG TAB: 0.1 mg | ORAL | Qty: 1

## 2019-05-12 NOTE — Progress Notes (Signed)
INTERNAL MEDICINE PROGRESS NOTE  Patient: [name] [name]   Date of Birth: [birth year]   MRN: [id number]      Hospital course / Assessment    Principle Problems:  Acute COPD exacerbation - treated with Duoneb's, IV steroid, c/w breo,singular, Spiriva    Acute hypoxic respiratory failure due to the above , improved   Abdominal pain - unclear aetiology , may related to nephrolithiasis, no acute pathology   Acute kidney injury - reated with IVF support and avoiding nephrotoxin, Cr 1.6->1.2 - Resolved    Tachycardia - Cardizem increased to 300 mg        Active Problems:  Hypertension on Cardizem, clonidine, aldactone , losartan on hold    Type 2 DM - on glu-commander   Hyperlipidemia on statin   Peripheral neuropathy on gabapentin   History of tobacco abuse  Pulmonary nodule  Bullous emphysema  Chronic pain syndrome   GERD on PPI     Code Status: Full code   DVT prophylaxis: pharmacologic and mechanical  Recommendation and Plan:   Increase Cardizem to 300 mg and monitor  Hold Losartan   Tele monitor   Continue with breathing Rx and Po prednisone   Monitor renal function       Disposition    Disposition: home     Subjective / ROS:   Patient states feel better today       Medical Decision Making   Chart, Images and Lab data reviewed, necessary medical Orders placed   Discussed with nursing staff     Vitals:    05/11/19 2001 05/12/19 0049 05/12/19 0420 05/12/19 0907   BP: 125/81 (!) 155/103 (!) 154/87 132/80   Pulse: 87 (!) 101 97 (!) 102   Resp: Temp: 99 ??F (37.2 ??C) 97.7 ??F (36.5 ??C) 98.4 ??F (36.9 ??C) 97.8 ??F (36.6 ??C)   SpO2: 98% 94% 100% 98%   Weight:       Height:         Temp (24hrs), Avg:98.5 ??F (36.9 ??C), Min:97.7 ??F (36.5 ??C), Max:99 ??F (37.2 ??C)      Intake/Output Summary (Last 24 hours) at 05/12/2019 1017  Last data filed at 05/12/2019 0640  Gross per 24 hour   Intake 3355 ml   Output 6400 ml   Net -3045 ml       Physical Exam:   General Appearance:   Appears in no acute distress.,   HEENT:   Moist oral mucous membranes, conjunctiva clear,   Neck:   Supple  Lungs:    Mild  wheezes., No rales., Normal respiratory effort,   Heart:   Regular rate and rhythm  Abdomen:   Soft , Non-distended and Non-tender,   Extremities:   - edema of legs  Neuro:   alert, oriented, moves all extremities well    Current medications:     Current Facility-Administered Medications   Medication Dose Route Frequency   ??? predniSONE (DELTASONE) tablet 20 mg  20 mg Oral DAILY WITH BREAKFAST   ??? lactated Ringers infusion  75 mL/hr IntraVENous CONTINUOUS   ??? ondansetron (ZOFRAN) injection 4 mg  4 mg IntraVENous Q4H PRN   ??? HYDROcodone-acetaminophen (NORCO) 5-325 mg per tablet 1-2 Tab  1-2 Tab Oral Q4H PRN   ??? pantoprazole (PROTONIX) 40 mg in 0.9% sodium chloride 10 mL injection  40 mg IntraVENous DAILY   ??? cetirizine (ZYRTEC) tablet 10 mg  10 mg Oral DAILY   ??? cloNIDine HCL (CATAPRES)  tablet 0.3 mg  0.3 mg Oral BID   ??? dilTIAZem ER (CARDIZEM CD) capsule 240 mg  240 mg Oral DAILY   ??? fluticasone-vilanterol (BREO ELLIPTA) 115mcg-25mcg/puff  1 Puff Inhalation DAILY   ??? gabapentin (NEURONTIN) capsule 300 mg  300 mg Oral TID   ??? guaiFENesin ER (MUCINEX) tablet 600 mg  600 mg Oral Q12H   ??? [Held by provider] losartan (COZAAR) tablet 50 mg  50 mg Oral DAILY   ??? methocarbamoL (ROBAXIN) tablet 500 mg  500 mg Oral QID   ??? montelukast (SINGULAIR) tablet 10 mg  10 mg Oral QHS   ??? rosuvastatin (CRESTOR) tablet 20 mg  20 mg Oral QHS   ??? spironolactone (ALDACTONE) tablet 100 mg  100 mg Oral DAILY   ??? tiotropium bromide (SPIRIVA RESPIMAT) 2.5 mcg /actuation  1 Puff Inhalation DAILY   ??? traZODone (DESYREL) tablet 50 mg  50 mg Oral QHS PRN   ??? sodium chloride (NS) flush 5-10 mL  5-10 mL IntraVENous Q8H   ??? sodium chloride (NS) flush 5-10 mL  5-10 mL IntraVENous PRN   ??? naloxone (NARCAN) injection 0.1 mg  0.1 mg IntraVENous PRN   ??? acetaminophen (TYLENOL) tablet 650 mg  650 mg Oral Q6H PRN   ??? morphine injection 1 mg  1 mg IntraVENous Q4H PRN    ??? alum-mag hydroxide-simeth (MYLANTA) oral suspension 30 mL  30 mL Oral Q4H PRN   ??? albuterol (PROVENTIL VENTOLIN) nebulizer solution 2.5 mg  2.5 mg Nebulization Q6H PRN   ??? heparin (porcine) injection 5,000 Units  5,000 Units SubCUTAneous Q8H   ??? hydrALAZINE (APRESOLINE) 20 mg/mL injection 10 mg  10 mg IntraVENous Q6H PRN   ??? dextrose (D50) infusion 5-25 g  10-50 mL IntraVENous PRN   ??? glucagon (GLUCAGEN) injection 1 mg  1 mg IntraMUSCular PRN   ??? insulin glargine (LANTUS) injection 1-100 Units  1-100 Units SubCUTAneous QHS   ??? insulin lispro (HUMALOG) injection 1-100 Units  1-100 Units SubCUTAneous AC&HS   ??? insulin lispro (HUMALOG) injection 1-100 Units  1-100 Units SubCUTAneous PRN          Laboratory and Radiology Data :      Ferritin   Date Value Ref Range Status   03/07/2019 21.3 8.0 - 252.0 ng/ml Final     WBC   Date Value Ref Range Status   05/11/2019 10.1 4.0 - 11.0 1000/mm3 Final     No results found for: NAU  No results found for: UEO    Microbiology    GRAM STAIN   Date Value Ref Range Status   08/24/2018 (A)   Final    Rare Gram Positive Cocci In Clusters  Rare Gram Positive Bacilli  Few Gram Positive Cocci In Chains  <10 Epithelial cells/lpf  10 - 25 WBC's/lpf  Mucus Present         Recent Results (from the past 24 hour(s))   METABOLIC PANEL, BASIC    Collection Time: 05/11/19 11:00 AM   Result Value Ref Range    Sodium 139 136 - 145 mEq/L    Potassium 4.6 3.5 - 5.1 mEq/L    Chloride 105 98 - 107 mEq/L    CO2 30 21 - 32 mEq/L    Glucose 171 (H) 74 - 106 mg/dl    BUN 30 (H) 7 - 25 mg/dl    Creatinine 1.0 0.6 - 1.3 mg/dl    GFR est AA >16.1      GFR est non-AA 59  Calcium 9.0 8.5 - 10.1 mg/dl    Anion gap 4 (L) 5 - 15 mmol/L   CBC W/O DIFF    Collection Time: 05/11/19 11:00 AM   Result Value Ref Range    WBC 10.1 4.0 - 11.0 1000/mm3    RBC 4.13 3.60 - 5.20 M/uL    HGB 12.5 (L) 13.0 - 17.2 gm/dl    HCT 37.0 37.0 - 50.0 %    MCV 89.6 80.0 - 98.0 fL    MCH 30.3 25.4 - 34.6 pg     MCHC 33.8 30.0 - 36.0 gm/dl    PLATELET 198 140 - 450 1000/mm3    MPV 11.0 (H) 6.0 - 10.0 fL    RDW-SD 44.1 36.4 - 46.3     GLUCOSE, POC    Collection Time: 05/11/19 12:03 PM   Result Value Ref Range    Glucose (POC) 155 (H) 65 - 105 mg/dL   GLUCOSE, POC    Collection Time: 05/11/19  4:50 PM   Result Value Ref Range    Glucose (POC) 210 (H) 65 - 105 mg/dL   GLUCOSE, POC    Collection Time: 05/11/19 10:09 PM   Result Value Ref Range    Glucose (POC) 196 (H) 65 - 283 mg/dL   METABOLIC PANEL, BASIC    Collection Time: 05/12/19  2:53 AM   Result Value Ref Range    Sodium 134 (L) 136 - 145 mEq/L    Potassium 5.3 (H) 3.5 - 5.1 mEq/L    Chloride 103 98 - 107 mEq/L    CO2 24 21 - 32 mEq/L    Glucose 292 (H) 74 - 106 mg/dl    BUN 23 7 - 25 mg/dl    Creatinine 1.2 0.6 - 1.3 mg/dl    GFR est AA 58.0      GFR est non-AA 48      Calcium 9.0 8.5 - 10.1 mg/dl    Anion gap 7 5 - 15 mmol/L   GLUCOSE, POC    Collection Time: 05/12/19  8:46 AM   Result Value Ref Range    Glucose (POC) 289 (H) 65 - 105 mg/dL       XR Results:  Results from Hospital Encounter encounter on 05/07/19   XR CHEST SNGL V    Narrative EXAM:  Chest AP    INDICATIONS: SOB, hypoxia     COMPARISON: Most recently 03/13/2019.    FINDINGS:     There is an unchanged density in the left midlung zone just cranial to the known  left lower lobe nodule. This was shown to be atelectasis on previous CT. The  nodule is also unchanged in size. No new abnormalities are prominent bullous  disease at the left lung apex.    Normal cardiomediastinal contours.      Impression IMPRESSION:   1.  No acute cardiopulmonary process.  2.  Stable atelectasis and nodule in the left midlung zone.  3.  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         CT Results:  Results from [place] encounter on 05/07/19   CT ABD PELV W CONT    Narrative DICOM format image data is available to non-affiliated external healthcare   facilities or entities on a secure, media free, reciprocally searchable basis  with patient authorization for 12 months following the date of the study.    Clinical history:  Left lower quadrant pain, elevated blood blood cells    EXAMINATION:  CT scan of the abdomen and pelvis with intravenous contrast 05/07/2019. 5 mm  spiral scanning is performed from the costophrenic angles to the symphysis  pubis. Coronal and sagittal reconstruction imaging has been obtained.    Correlation: 05/11/2018 and 02/22/2018    FINDINGS:  There is respiratory motion artifact. There are bullous changes lung bases.  Osteopenia and degenerative changes of the spine. Right hip prosthesis. Liver,  gallbladder, spleen, adrenal glands are unremarkable.    1.6 and 1.1 cm splenic hypodensities unchanged from prior study. 2 mm  nonobstructing right nephrolithiasis. Left kidney and bladder are unremarkable.  Uterus is absent.    There are colonic diverticuli. Terminal ileum, appendix, stomach and small bowel  loops are within normal limits. Atherosclerotic calcification of the abdominal  aorta. No dominant lymph node enlargement or free fluid.      Impression IMPRESSION:  1. 2 mm nonobstructing right nephrolithiasis.  2. Small stable right renal cyst.  3. Diverticulosis.  4. Hysterectomy.  5. 1.6 and 1.1 cm stable splenic lesions.         MRI Results:  Results from Hospital Encounter encounter on 08/04/18   MRI BRAIN WO CONT    Narrative EXAMINATION: MRI BRAIN WO CONT    CLINICAL INDICATION: intractable HA         COMPARISON:  April 09, 2015    TECHNIQUE: T1 and T2-weighted sequences of the brain were obtained in multiple  planes without contrast.    FINDINGS:   No restricted diffusion. Stable left posterior fossa dural based mass 10 mm,  abutting tentorium. Isointense on T1 and T2. Findings typical of meningioma.    Stable left frontal scalp subgaleal lipoma. Small right frontal subgaleal  lipoma.   No  no midline shift. No hydrocephalus. No extra-axial fluid collections.  Visualized portions of the orbits grossly unremarkable.  Mastoid air cells well aerated. Visualized portions of paranasal sinuses  demonstrate mucosal thickening posterior right ethmoid air cells and right  sphenoid sinus.    Intracranial flow voids of great vessels, grossly intact. Extensive confluent T2  prolongation in corona radiata and centrum semiovale, more than expected for  patient's age. Extensive subcortical T2 prolongation in the frontoparietal  lobes. T2 prolongation central pons.  Marked frontotemporal lobe atrophy. No susceptibility artifact on gradient echo  imaging to suggest hemorrhage.  Cerebellar tonsil normal.               Impression IMPRESSION:  1.   Stable left posterior fossa dural based mass. Long-term stability would be  consistent with meningioma.   2. No acute infarction.  3. Extensive chronic microvascular ischemic change         Nuclear Medicine Results:  No results found for this or any previous visit.    [place] Results:  Results from Hospital Encounter encounter on 12/24/17   [place] RUQ    Narrative Examination: Ultrasound right upper quadrant.    INDICATION: Pain.    FINDINGS:    The liver appears grossly unremarkable. No focal hepatic lesions definitely  identified.    The gallbladder appears unremarkable. No gallbladder wall thickening. No  sonographic Murphy's sign. CBD within normal limits roughly measuring 0.3 cm.    Right kidney appears unremarkable and is normal in size. A 4 mm nonobstructing  renal calculus. Tiny cyst on previous CT dated 08/27/2016, not well visualized on  the current exam.    Proximal aorta and IVC appear unremarkable.      Impression IMPRESSION:  1. No acute abnormalities.  2. No gallbladder wall thickening, gallstones or evidence of acute  cholecystitis.  3. Nonobstructing right renal calculus.         IR Results:  No results found for this or any previous visit.    VAS/US Results:   Results from Hospital Encounter encounter on 08/04/18   DUPLEX TEMPORAL ART RIGHT LTD    Narrative                                                               Study ID:   [id number]                                                 [facility]                                          [facility]                                          [facility]                                            [address]                                        [address] [place]                                        [place],                                          [place]                                           [phone]                                   Temporal Artery Duplex Report    Name: Anne ShutterOWENS, Devin R          Study Date: 08/07/2018 09:54 AM  MRN: [id number]                    Patient Location: [id number]  DOB: 1950-09-25  Age: 18 yrs  Gender: Female                 Account #: [id number]  Reason For Study: Headaches  Ordering Physician: Scarlette Ar    Performed By: Eddie North, RVT    Interpretation Summary  Normal velocities were dected in all vessels.  No sign of halo detected around the parietal, frontal and temporal arteries.  _____________________________________________________________________________  __      Quality/Procedure  Limited color duplex ultrasound of the temporal arteries was performed. ICD  10: R51.    History/symptoms  Diabetes. HTN. COPD. Headaches.    Right Superficial Temporal Artery Velocities  RT: Proximal superficial temporal branch 39.6cm/s. RT: Mid superficial  temporal branch 45.3cm/s. RT: Distal superficial temporal branch 40.6cm/s.    Right Frontal Branch Velocities  RT: Distal frontal branch 21.2cm/s. RT: Mid frontal branch 15.3cm/s. RT:  Proximal frontal branch 27.8cm/s.      Right Parietal Branch Velocities  RT: Proximal parietal branch 38.4cm/s. RT: Mid parietal branch 24.9cm/s. RT:  Distal parietal branch 30.3cm/s.       Electronically signed byDr. Neta Ehlers, M.D   08/07/2018 04:43 PM               Marnee Guarneri M.D.  HospitalistSt. Helena Parish [facility]  Page [phone]

## 2019-05-12 NOTE — Progress Notes (Signed)
INTERNAL MEDICINE PROGRESS NOTE  Patient: [name] [name]   Date of Birth: 1950/11/09   MRN: [id number]      Hospital course / Assessment    Principle Problems:  Acute COPD exacerbation - treated with Duoneb's, IV steroid, c/w breo,singular, Spiriva    Acute hypoxic respiratory failure due to the above , improved   Abdominal pain - unclear aetiology , may related to nephrolithiasis, no acute pathology   Acute kidney injury - reated with IVF support and avoiding nephrotoxin, Cr 1.6->1.2 - Resolved    Tachycardia - Cardizem increased to 300 mg        Active Problems:  Hypertension on Cardizem, clonidine, aldactone , losartan on hold    Type 2 DM - on glu-commander   Hyperlipidemia on statin   Peripheral neuropathy on gabapentin   History of tobacco abuse  Pulmonary nodule  Bullous emphysema  Chronic pain syndrome   GERD on PPI     Code Status: Full code   DVT prophylaxis: pharmacologic and mechanical  Recommendation and Plan:   Increase Cardizem to 300 mg and monitor  Hold Losartan   Tele monitor   Continue with breathing Rx and Po prednisone   Monitor renal function       Disposition    Disposition: home     Subjective / ROS:   Patient states feel better today       Medical Decision Making   Chart, Images and Lab data reviewed, necessary medical Orders placed   Discussed with nursing staff     Vitals:    05/11/19 2001 05/12/19 0049 05/12/19 0420 05/12/19 0907   BP: 125/81 (!) 155/103 (!) 154/87 132/80   Pulse: 87 (!) 101 97 (!) 102   Resp: Temp: 99 ??F (37.2 ??C) 97.7 ??F (36.5 ??C) 98.4 ??F (36.9 ??C) 97.8 ??F (36.6 ??C)   SpO2: 98% 94% 100% 98%   Weight:       Height:         Temp (24hrs), Avg:98.5 ??F (36.9 ??C), Min:97.7 ??F (36.5 ??C), Max:99 ??F (37.2 ??C)      Intake/Output Summary (Last 24 hours) at 05/12/2019 1017  Last data filed at 05/12/2019 0640  Gross per 24 hour   Intake 3355 ml   Output 6400 ml   Net -3045 ml       Physical Exam:   General Appearance:   Appears in no acute distress.,  HEENT:   Moist oral  mucous membranes, conjunctiva clear,   Neck:   Supple  Lungs:    Mild  wheezes., No rales., Normal respiratory effort,   Heart:   Regular rate and rhythm  Abdomen:   Soft , Non-distended and Non-tender,   Extremities:   - edema of legs  Neuro:   alert, oriented, moves all extremities well    Current medications:     Current Facility-Administered Medications   Medication Dose Route Frequency   ??? predniSONE (DELTASONE) tablet 20 mg  20 mg Oral DAILY WITH BREAKFAST   ??? lactated Ringers infusion  75 mL/hr IntraVENous CONTINUOUS   ??? ondansetron (ZOFRAN) injection 4 mg  4 mg IntraVENous Q4H PRN   ??? HYDROcodone-acetaminophen (NORCO) 5-325 mg per tablet 1-2 Tab  1-2 Tab Oral Q4H PRN   ??? pantoprazole (PROTONIX) 40 mg in 0.9% sodium chloride 10 mL injection  40 mg IntraVENous DAILY   ??? cetirizine (ZYRTEC) tablet 10 mg  10 mg Oral DAILY   ??? cloNIDine HCL (CATAPRES)  tablet 0.3 mg  0.3 mg Oral BID   ??? dilTIAZem ER (CARDIZEM CD) capsule 240 mg  240 mg Oral DAILY   ??? fluticasone-vilanterol (BREO ELLIPTA) 14100mcg-25mcg/puff  1 Puff Inhalation DAILY   ??? gabapentin (NEURONTIN) capsule 300 mg  300 mg Oral TID   ??? guaiFENesin ER (MUCINEX) tablet 600 mg  600 mg Oral Q12H   ??? [Held by provider] losartan (COZAAR) tablet 50 mg  50 mg Oral DAILY   ??? methocarbamoL (ROBAXIN) tablet 500 mg  500 mg Oral QID   ??? montelukast (SINGULAIR) tablet 10 mg  10 mg Oral QHS   ??? rosuvastatin (CRESTOR) tablet 20 mg  20 mg Oral QHS   ??? spironolactone (ALDACTONE) tablet 100 mg  100 mg Oral DAILY   ??? tiotropium bromide (SPIRIVA RESPIMAT) 2.5 mcg /actuation  1 Puff Inhalation DAILY   ??? traZODone (DESYREL) tablet 50 mg  50 mg Oral QHS PRN   ??? sodium chloride (NS) flush 5-10 mL  5-10 mL IntraVENous Q8H   ??? sodium chloride (NS) flush 5-10 mL  5-10 mL IntraVENous PRN   ??? naloxone (NARCAN) injection 0.1 mg  0.1 mg IntraVENous PRN   ??? acetaminophen (TYLENOL) tablet 650 mg  650 mg Oral Q6H PRN   ??? morphine injection 1 mg  1 mg IntraVENous Q4H PRN   ??? alum-mag  hydroxide-simeth (MYLANTA) oral suspension 30 mL  30 mL Oral Q4H PRN   ??? albuterol (PROVENTIL VENTOLIN) nebulizer solution 2.5 mg  2.5 mg Nebulization Q6H PRN   ??? heparin (porcine) injection 5,000 Units  5,000 Units SubCUTAneous Q8H   ??? hydrALAZINE (APRESOLINE) 20 mg/mL injection 10 mg  10 mg IntraVENous Q6H PRN   ??? dextrose (D50) infusion 5-25 g  10-50 mL IntraVENous PRN   ??? glucagon (GLUCAGEN) injection 1 mg  1 mg IntraMUSCular PRN   ??? insulin glargine (LANTUS) injection 1-100 Units  1-100 Units SubCUTAneous QHS   ??? insulin lispro (HUMALOG) injection 1-100 Units  1-100 Units SubCUTAneous AC&HS   ??? insulin lispro (HUMALOG) injection 1-100 Units  1-100 Units SubCUTAneous PRN          Laboratory and Radiology Data :      Ferritin   Date Value Ref Range Status   03/07/2019 21.3 8.0 - 252.0 ng/ml Final     WBC   Date Value Ref Range Status   05/11/2019 10.1 4.0 - 11.0 1000/mm3 Final     No results found for: NAU  No results found for: UEO    Microbiology    GRAM STAIN   Date Value Ref Range Status   08/24/2018 (A)   Final    Rare Gram Positive Cocci In Clusters  Rare Gram Positive Bacilli  Few Gram Positive Cocci In Chains  <10 Epithelial cells/lpf  10 - 25 WBC's/lpf  Mucus Present         Recent Results (from the past 24 hour(s))   METABOLIC PANEL, BASIC    Collection Time: 05/11/19 11:00 AM   Result Value Ref Range    Sodium 139 136 - 145 mEq/L    Potassium 4.6 3.5 - 5.1 mEq/L    Chloride 105 98 - 107 mEq/L    CO2 30 21 - 32 mEq/L    Glucose 171 (H) 74 - 106 mg/dl    BUN 30 (H) 7 - 25 mg/dl    Creatinine 1.0 0.6 - 1.3 mg/dl    GFR est AA >09.8>60.0      GFR est non-AA 59  Calcium 9.0 8.5 - 10.1 mg/dl    Anion gap 4 (L) 5 - 15 mmol/L   CBC W/O DIFF    Collection Time: 05/11/19 11:00 AM   Result Value Ref Range    WBC 10.1 4.0 - 11.0 1000/mm3    RBC 4.13 3.60 - 5.20 M/uL    HGB 12.5 (L) 13.0 - 17.2 gm/dl    HCT 09.8 11.9 - 14.7 %    MCV 89.6 80.0 - 98.0 fL    MCH 30.3 25.4 - 34.6 pg    MCHC 33.8 30.0 - 36.0 gm/dl     PLATELET 829 562 - 450 1000/mm3    MPV 11.0 (H) 6.0 - 10.0 fL    RDW-SD 44.1 36.4 - 46.3     GLUCOSE, POC    Collection Time: 05/11/19 12:03 PM   Result Value Ref Range    Glucose (POC) 155 (H) 65 - 105 mg/dL   GLUCOSE, POC    Collection Time: 05/11/19  4:50 PM   Result Value Ref Range    Glucose (POC) 210 (H) 65 - 105 mg/dL   GLUCOSE, POC    Collection Time: 05/11/19 10:09 PM   Result Value Ref Range    Glucose (POC) 196 (H) 65 - 105 mg/dL   METABOLIC PANEL, BASIC    Collection Time: 05/12/19  2:53 AM   Result Value Ref Range    Sodium 134 (L) 136 - 145 mEq/L    Potassium 5.3 (H) 3.5 - 5.1 mEq/L    Chloride 103 98 - 107 mEq/L    CO2 24 21 - 32 mEq/L    Glucose 292 (H) 74 - 106 mg/dl    BUN 23 7 - 25 mg/dl    Creatinine 1.2 0.6 - 1.3 mg/dl    GFR est AA 13.0      GFR est non-AA 48      Calcium 9.0 8.5 - 10.1 mg/dl    Anion gap 7 5 - 15 mmol/L   GLUCOSE, POC    Collection Time: 05/12/19  8:46 AM   Result Value Ref Range    Glucose (POC) 289 (H) 65 - 105 mg/dL       XR Results:  Results from Hospital Encounter encounter on 05/07/19   XR CHEST SNGL V    Narrative EXAM:  Chest AP    INDICATIONS: SOB, hypoxia     COMPARISON: Most recently 03/13/2019.    FINDINGS:     There is an unchanged density in the left midlung zone just cranial to the known  left lower lobe nodule. This was shown to be atelectasis on previous CT. The  nodule is also unchanged in size. No new abnormalities are prominent bullous  disease at the left lung apex.    Normal cardiomediastinal contours.      Impression IMPRESSION:   1.  No acute cardiopulmonary process.  2.  Stable atelectasis and nodule in the left midlung zone.  3.  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         CT Results:  Results from Hospital Encounter encounter on 05/07/19   CT ABD PELV W CONT    Narrative DICOM format image data is available to non-affiliated external healthcare  facilities or entities on a secure, media free, reciprocally searchable basis  with patient  authorization for 12 months following the date of the study.    Clinical history:  Left lower quadrant pain, elevated blood blood cells    EXAMINATION:  CT scan of the abdomen and pelvis with intravenous contrast 05/07/2019. 5 mm  spiral scanning is performed from the costophrenic angles to the symphysis  pubis. Coronal and sagittal reconstruction imaging has been obtained.    Correlation: 05/11/2018 and 02/22/2018    FINDINGS:  There is respiratory motion artifact. There are bullous changes lung bases.  Osteopenia and degenerative changes of the spine. Right hip prosthesis. Liver,  gallbladder, spleen, adrenal glands are unremarkable.    1.6 and 1.1 cm splenic hypodensities unchanged from prior study. 2 mm  nonobstructing right nephrolithiasis. Left kidney and bladder are unremarkable.  Uterus is absent.    There are colonic diverticuli. Terminal ileum, appendix, stomach and small bowel  loops are within normal limits. Atherosclerotic calcification of the abdominal  aorta. No dominant lymph node enlargement or free fluid.      Impression IMPRESSION:  1. 2 mm nonobstructing right nephrolithiasis.  2. Small stable right renal cyst.  3. Diverticulosis.  4. Hysterectomy.  5. 1.6 and 1.1 cm stable splenic lesions.         MRI Results:  Results from [place] encounter on 08/04/18   MRI BRAIN WO CONT    Narrative EXAMINATION: MRI BRAIN WO CONT    CLINICAL INDICATION: intractable HA         COMPARISON:  April 09, 2015    TECHNIQUE: T1 and T2-weighted sequences of the brain were obtained in multiple  planes without contrast.    FINDINGS:   No restricted diffusion. Stable left posterior fossa dural based mass 10 mm,  abutting tentorium. Isointense on T1 and T2. Findings typical of meningioma.    Stable left frontal scalp subgaleal lipoma. Small right frontal subgaleal  lipoma.  No  no midline shift. No hydrocephalus. No extra-axial fluid collections.  Visualized portions of the orbits grossly  unremarkable.  Mastoid air cells well aerated. Visualized portions of paranasal sinuses  demonstrate mucosal thickening posterior right ethmoid air cells and right  sphenoid sinus.    Intracranial flow voids of great vessels, grossly intact. Extensive confluent T2  prolongation in corona radiata and centrum semiovale, more than expected for  patient's age. Extensive subcortical T2 prolongation in the frontoparietal  lobes. T2 prolongation central pons.  Marked frontotemporal lobe atrophy. No susceptibility artifact on gradient echo  imaging to suggest hemorrhage.  Cerebellar tonsil normal.               Impression IMPRESSION:  1.   Stable left posterior fossa dural based mass. Long-term stability would be  consistent with meningioma.   2. No acute infarction.  3. Extensive chronic microvascular ischemic change         Nuclear Medicine Results:  No results found for this or any previous visit.    [place] Results:  Results from [place] encounter on 12/24/17   [place] RUQ    Narrative Examination: Ultrasound right upper quadrant.    INDICATION: Pain.    FINDINGS:    The liver appears grossly unremarkable. No focal hepatic lesions definitely  identified.    The gallbladder appears unremarkable. No gallbladder wall thickening. No  sonographic Murphy's sign. CBD within normal limits roughly measuring 0.3 cm.    Right kidney appears unremarkable and is normal in size. A 4 mm nonobstructing  renal calculus. Tiny cyst on previous CT dated 08/27/2016, not well visualized on  the current exam.    Proximal aorta and IVC appear unremarkable.      Impression IMPRESSION:  1. No acute abnormalities.  2. No gallbladder wall thickening, gallstones or evidence of acute  cholecystitis.  3. Nonobstructing right renal calculus.         IR Results:  No results found for this or any previous visit.    VAS/US Results:  Results from Hospital Encounter encounter on 08/04/18   DUPLEX TEMPORAL ART RIGHT LTD    Narrative                                                                Study ID:   [id number]                                                 [facility]                                            [address] [place],                                          [place]                                           [address]                                   Temporal Artery Duplex Report    Name: [name], [name]          Study Date: 08/07/2018 09:54 AM  MRN: [id number]                    Patient Location: [id number]  DOB: Oct 02, 1950  Age: 73 yrs  Gender: Female                 Account #: [id number]  Reason For Study: Headaches  Ordering Physician: Scarlette Ar    Performed By: Eddie North, RVT    Interpretation Summary  Normal velocities were dected in all vessels.  No sign of halo detected around the parietal, frontal and temporal arteries.  _____________________________________________________________________________  __      Quality/Procedure  Limited color duplex ultrasound of the temporal arteries was performed. ICD  10: R51.    History/symptoms  Diabetes. HTN. COPD. Headaches.    Right Superficial Temporal Artery Velocities  RT: Proximal superficial temporal branch 39.6cm/s. RT: Mid superficial  temporal branch 45.3cm/s. RT: Distal superficial temporal branch 40.6cm/s.    Right Frontal Branch Velocities  RT: Distal frontal branch 21.2cm/s. RT: Mid frontal branch 15.3cm/s. RT:  Proximal frontal branch 27.8cm/s.      Right Parietal Branch Velocities  RT: Proximal parietal branch 38.4cm/s. RT: Mid parietal branch 24.9cm/s. RT:  Distal parietal branch 30.3cm/s.      Electronically signed byDr. Neta Ehlers, M.D   08/07/2018 04:43 PM               Marnee Guarneri M.D.  [facility]  Page [phone]

## 2019-05-13 LAB — BASIC METABOLIC PANEL
Anion Gap: 5 mmol/L (ref 5–15)
BUN: 20 mg/dl (ref 7–25)
CO2: 34 mEq/L — ABNORMAL HIGH (ref 21–32)
Calcium: 9 mg/dl (ref 8.5–10.1)
Chloride: 100 mEq/L (ref 98–107)
Creatinine: 1 mg/dl (ref 0.6–1.3)
EGFR IF NonAfrican American: 59
GFR African American: >60
Glucose: 194 mg/dl — ABNORMAL HIGH (ref 74–106)
Potassium: 4.3 mEq/L (ref 3.5–5.1)
Sodium: 139 mEq/L (ref 136–145)

## 2019-05-13 LAB — CBC WITH AUTO DIFFERENTIAL
Basophils %: 0.2 % (ref 0–3)
Eosinophils %: 0.9 % (ref 0–5)
Hematocrit: 36.8 % — ABNORMAL LOW (ref 37.0–50.0)
Hemoglobin: 12 gm/dl — ABNORMAL LOW (ref 13.0–17.2)
Immature Granulocytes: 0.4 % (ref 0.0–3.0)
Lymphocytes %: 22.5 % — ABNORMAL LOW (ref 28–48)
MCH: 29.2 pg (ref 25.4–34.6)
MCHC: 32.6 gm/dl (ref 30.0–36.0)
MCV: 89.5 fL (ref 80.0–98.0)
MPV: 11.3 fL — ABNORMAL HIGH (ref 6.0–10.0)
Monocytes %: 9.9 % (ref 1–13)
Neutrophils %: 66.1 % — ABNORMAL HIGH (ref 34–64)
Nucleated RBCs: 0 (ref 0–0)
Platelets: 218 10*3/uL (ref 140–450)
RBC: 4.11 M/uL (ref 3.60–5.20)
RDW-SD: 43.6 (ref 36.4–46.3)
WBC: 12.4 10*3/uL — ABNORMAL HIGH (ref 4.0–11.0)

## 2019-05-13 LAB — POCT GLUCOSE
POC Glucose: 274 mg/dL — ABNORMAL HIGH (ref 65–105)
POC Glucose: 174 mg/dL — ABNORMAL HIGH (ref 65–105)
POC Glucose: 209 mg/dL — ABNORMAL HIGH (ref 65–105)

## 2019-05-13 LAB — HCV RT-PCR, QUANT (NON-GRAPH)
HCV RNA, PCR QT: <1.18 log IU/mL
HCV RNA, PCR QT: <1.18 log IU/mL
HCV RNA, PCR, QT: <15 IU/mL
Hepatitis C Virus (HCV), Quantitative, Real-time PCR: <15 IU/mL

## 2019-05-13 LAB — METABOLIC PANEL, BASIC
Anion gap: 5 mmol/L (ref 5–15)
BUN: 20 mg/dl (ref 7–25)
CO2: 34 mEq/L — ABNORMAL HIGH (ref 21–32)
Calcium: 9 mg/dl (ref 8.5–10.1)
Chloride: 100 mEq/L (ref 98–107)
Creatinine: 1 mg/dl (ref 0.6–1.3)
GFR est AA: >60
GFR est non-AA: 59
Glucose: 194 mg/dl — ABNORMAL HIGH (ref 74–106)
Potassium: 4.3 mEq/L (ref 3.5–5.1)
Sodium: 139 mEq/L (ref 136–145)

## 2019-05-13 LAB — CBC WITH AUTOMATED DIFF
BASOPHILS: 0.2 % (ref 0–3)
EOSINOPHILS: 0.9 % (ref 0–5)
HCT: 36.8 % — ABNORMAL LOW (ref 37.0–50.0)
HGB: 12 gm/dl — ABNORMAL LOW (ref 13.0–17.2)
IMMATURE GRANULOCYTES: 0.4 % (ref 0.0–3.0)
LYMPHOCYTES: 22.5 % — ABNORMAL LOW (ref 28–48)
MCH: 29.2 pg (ref 25.4–34.6)
MCHC: 32.6 gm/dl (ref 30.0–36.0)
MCV: 89.5 fL (ref 80.0–98.0)
MONOCYTES: 9.9 % (ref 1–13)
MPV: 11.3 fL — ABNORMAL HIGH (ref 6.0–10.0)
NEUTROPHILS: 66.1 % — ABNORMAL HIGH (ref 34–64)
NRBC: 0 (ref 0–0)
PLATELET: 218 10*3/uL (ref 140–450)
RBC: 4.11 M/uL (ref 3.60–5.20)
RDW-SD: 43.6 (ref 36.4–46.3)
WBC: 12.4 10*3/uL — ABNORMAL HIGH (ref 4.0–11.0)

## 2019-05-13 LAB — GLUCOSE, POC
Glucose (POC): 274 mg/dL — ABNORMAL HIGH (ref 65–105)
Glucose (POC): 174 mg/dL — ABNORMAL HIGH (ref 65–105)
Glucose (POC): 209 mg/dL — ABNORMAL HIGH (ref 65–105)

## 2019-05-13 MED FILL — MORPHINE 2 MG/ML INJECTION: 2 mg/mL | INTRAMUSCULAR | Qty: 1

## 2019-05-13 MED FILL — HEPARIN (PORCINE) 5,000 UNIT/ML IJ SOLN: 5000 unit/mL | INTRAMUSCULAR | Qty: 1

## 2019-05-13 MED FILL — PREDNISONE 20 MG TAB: 20 mg | ORAL | Qty: 1

## 2019-05-13 MED FILL — SPIRONOLACTONE 25 MG TAB: 25 mg | ORAL | Qty: 4

## 2019-05-13 MED FILL — POLYETHYLENE GLYCOL 3350 17 GRAM (100 %) ORAL POWDER PACKET: 17 gram | ORAL | Qty: 1

## 2019-05-13 MED FILL — GABAPENTIN 300 MG CAP: 300 mg | ORAL | Qty: 1

## 2019-05-13 MED FILL — CETIRIZINE 5 MG TAB: 5 mg | ORAL | Qty: 2

## 2019-05-13 MED FILL — METHOCARBAMOL 500 MG TAB: 500 mg | ORAL | Qty: 1

## 2019-05-13 MED FILL — MUCINEX 600 MG TABLET, EXTENDED RELEASE: 600 mg | ORAL | Qty: 1

## 2019-05-13 MED FILL — PANTOPRAZOLE 40 MG IV SOLR: 40 mg | INTRAVENOUS | Qty: 40

## 2019-05-13 MED FILL — CLONIDINE 0.1 MG TAB: 0.1 mg | ORAL | Qty: 1

## 2019-05-13 MED FILL — ROSUVASTATIN 20 MG TAB: 20 mg | ORAL | Qty: 1

## 2019-05-13 MED FILL — MONTELUKAST 10 MG TAB: 10 mg | ORAL | Qty: 1

## 2019-05-13 MED FILL — SODIUM CHLORIDE 0.9 % IJ SYRG: INTRAMUSCULAR | Qty: 20

## 2019-05-13 MED FILL — DILTIAZEM ER 300 MG 24 HR CAP: 300 mg | ORAL | Qty: 1

## 2019-05-13 MED FILL — HYDROCODONE-ACETAMINOPHEN 5 MG-325 MG TAB: 5-325 mg | ORAL | Qty: 2

## 2019-05-13 NOTE — Progress Notes (Signed)
Problem: Falls - Risk of  Goal: *Absence of Falls  Description: Document Patrcia Dolly Fall Risk and appropriate interventions in the flowsheet.  Outcome: Resolved/Not Met     Problem: Patient Education: Go to Patient Education Activity  Goal: Patient/Family Education  Outcome: Resolved/Not Met     Problem: Gas Exchange - Impaired  Goal: *Absence of hypoxia  Outcome: Resolved/Not Met     Problem: Patient Education: Go to Patient Education Activity  Goal: Patient/Family Education  Outcome: Resolved/Not Met     Problem: Risk for Spread of Infection  Goal: Prevent transmission of infectious organism to others  Description: Prevent the transmission of infectious organisms to other patients, staff members, and visitors.  Outcome: Resolved/Not Met     Problem: Patient Education:  Go to Education Activity  Goal: Patient/Family Education  Outcome: Resolved/Not Met

## 2019-05-13 NOTE — Other (Addendum)
----------  DocumentID: [id number]------------------------------------------------              [facility]                       Patient Education Report         Name: [name] [name], [name] [name]                  Date: 05/07/2019    MRN: [id number]                    Time: 9:37:01 PM         Patient ordered video: 'Patient Safety: Stay Safe While you are in the Hospital'    from [phone] via phone number: [phone] at 9:37:01 PM    Description: This program outlines some of the precautions patients can take to ensure a speedy recovery without extra complications. The video emphasizes the importance of communicating with the healthcare team.    ----------DocumentID: [id number]------------------------------------------------                       [facility]          Patient Education Report - Discharge Summary        Date: 05/13/2019   Time: 12:39:29 PM   Name: [name], [name]   MRN: [id number]      Account Number: [id number]      Education History:        Patient ordered video: 'Patient Safety: Stay Safe While you are in the Hospital' from [id number] on 05/07/2019 09:37:01 PM

## 2019-05-13 NOTE — Other (Signed)
Discharge instructions given, follow up appointmentold time/date/address/phone number.Medications reviewed included what was given today and what needs to be still given.Patient aware when to call Dr. Melynda Keller come to ER department.No prescriptions rendered by MD. Family/friends present and ready to take patient home.All personal belongings packed and sent with patient.all questions answered.

## 2019-05-13 NOTE — Discharge Summary (Signed)
Discharge Summary       Patient ID:  [name] [name],   68 y.o., female  Aug 20, 1950    PCP:  Wallene Huh, PA-C    Admit Date: 05/07/2019  1:23 PM  Discharge Date:  No discharge date for patient encounter.  Length of stay: 6 day(s)  Code Status: Full Code  Discharging physician: Waneta Martins, MD    Chief Complaint   Patient presents with   ??? Abdominal Pain   ??? Cough       Discharge Medications  Current Discharge Medication List      CONTINUE these medications which have NOT CHANGED    Details   methocarbamoL (Robaxin) 500 mg tablet Take 1 Tab by mouth four (4) times daily.  Qty: 20 Tab, Refills: 0      ibuprofen (MOTRIN) 600 mg tablet Take 1 Tab by mouth every six (6) hours as needed for Pain.  Qty: 20 Tab, Refills: 0      butalbital-acetaminophen-caff (FIORICET) 50-300-40 mg per capsule Take 1 Cap by mouth every four (4) hours as needed for Headache.  Qty: 30 Cap, Refills: 0      dilTIAZem CD (CARDIZEM CD) 240 mg ER capsule Take 1 Cap by mouth daily.  Qty: 30 Cap, Refills: 1      losartan (COZAAR) 50 mg tablet Take 1 Tab by mouth daily.  Qty: 30 Tab, Refills: 1      cetirizine (ZYRTEC) 10 mg tablet Take 1 Tab by mouth daily.  Qty: 30 Tab, Refills: 1      fluticasone propionate (FLONASE) 50 mcg/actuation nasal spray 2 Sprays by Both Nostrils route daily.  Qty: 1 Bottle, Refills: 1      guaiFENesin ER (MUCINEX) 600 mg ER tablet Take 1 Tab by mouth every twelve (12) hours.  Qty: 20 Tab, Refills: 0      LANTUS SOLOSTAR U-100 INSULIN 100 unit/mL (3 mL) inpn INJECT 32 UNITS SUBCUTANEOUSLY  NIGHTLY  Qty: 15 mL, Refills: 0    Associated Diagnoses: Type 2 diabetes mellitus with diabetic neuropathy, without long-term current use of insulin (HCC)      albuterol (ACCUNEB) 1.25 mg/3 mL nebu USE 1 VIAL IN NEBULIZER EVERY 6 HOURS AS NEEDED FOR  SHORTNESS  OF  BREATH,  WHEEZING  Qty: 270 mL, Refills: 1      cloNIDine HCl (CATAPRES) 0.1 mg tablet Take 3 Tabs by mouth two (2) times a day.  Qty: 60 Tab, Refills: 1       spironolactone (ALDACTONE) 100 mg tablet TAKE 1 TABLET EVERY DAY  Qty: 90 Tab, Refills: 0    Associated Diagnoses: Severe hypertension      metFORMIN ER (GLUCOPHAGE XR) 500 mg tablet TAKE 1 TABLET TWICE DAILY  Qty: 180 Tab, Refills: 1      montelukast (SINGULAIR) 10 mg tablet TAKE 1 TABLET EVERY DAY  Qty: 90 Tab, Refills: 3      gabapentin (NEURONTIN) 300 mg capsule Take 1 Cap by mouth three (3) times daily. Max Daily Amount: 900 mg.  Qty: 270 Cap, Refills: 1    Associated Diagnoses: Chronic right hip pain      polyethylene glycol (MIRALAX) 17 gram packet Take 1 Packet by mouth daily.  Qty: 30 Packet, Refills: 1      magnesium oxide (MAG-OX) 400 mg tablet Take 400 mg by mouth daily.      QUEtiapine SR (SEROQUEL XR) 150 mg sr tablet Take 150 mg by mouth nightly as needed.  tiotropium bromide (SPIRIVA RESPIMAT) 2.5 mcg/actuation inhaler Take 1 Puff by inhalation daily.  Qty: 1 Inhaler, Refills: 0    Associated Diagnoses: COPD with acute exacerbation (HCC)      fluticasone propion-salmeterol (ADVAIR DISKUS) 100-50 mcg/dose diskus inhaler Take 1 Puff by inhalation two (2) times a day.  Qty: 1 Inhaler, Refills: 0      rosuvastatin (CRESTOR) 5 mg tablet TAKE 1 TABLET BY MOUTH NIGHTLY  Qty: 90 Tab, Refills: 1    Associated Diagnoses: Type 2 diabetes mellitus with diabetic neuropathy, without long-term current use of insulin (HCC)      traZODone (DESYREL) 50 mg tablet Take 1 Tab by mouth nightly as needed for Sleep.  Qty: 20 Tab, Refills: 0                 HPI on Admission (per admitting physician):   [name] [name] is a 68 y.o. OTHER female who presents with not feeling well for the past few hours  Patient said she has been feeling nauseous, had some vomiting and loose bowel movement, developed progressive abdominal pain more on the left than the right  She also has been having shortness of breath with cough which is nonproductive, no fever no chest pain   She has history of COPD feels that the symptoms are very consistent to her previous COPD exacerbations    Hospital Course:     The patient admitted for the following Principal Medical Problem:   Acute COPD exacerbation - treated with Duoneb's, IV steroid, c/w breo,singular, Spiriva    Acute hypoxic respiratory failure due to the above , improved   Abdominal pain - unclear aetiology , may related to nephrolithiasis, no acute pathology   Acute kidney injury - reated with IVF support and avoiding nephrotoxin, Cr 1.6->1.2 - Resolved        The patient feel better and want to be d/c home     Other Active Problems managed during this hospitalization:  Hypertension on Cardizem, clonidine, aldactone , losartan on hold    Type 2 DM - on glu-commander   Hyperlipidemia on statin   Peripheral neuropathy on gabapentin   History of tobacco abuse  Pulmonary nodule  Bullous emphysema  Chronic pain syndrome   GERD on PPI     The patient condition became clinically stable and No new complain or complication during the hospital course.    The Medication changes discussed with the patient and family on the discharge    Condition at discharge   Afebrile  Ambulating  Eating, Drinking, Voiding  Stable        Physical Exam on Discharge:  Visit Vitals  BP (!) 140/75 (BP 1 Location: Right arm, BP Patient Position: Supine)   Pulse 68   Temp 98.3 ??F (36.8 ??C)   Resp 16   Ht 5\' 8"  (1.727 m)   Wt 96.3 kg (212 lb 4.9 oz)   SpO2 98%   Breastfeeding No   BMI 32.28 kg/m??       General Appearance:   Appears in no acute distress., Sitting up.,   Skin:   Skin warm & dry, No rash, No jaundice,   Lymph:  There is no lymphadenopathy,   HEENT:   PERRLA, EOMI, Moist oral mucous membranes, conjunctiva clear,   Neck:   Supple, Without masses,   Lungs:   Clear, No wheezes., No rales., Normal respiratory effort,   Heart:   Regular rate and rhythm, No gallop,   Abdomen:   Soft ,  Non-distended, Normal bowel sounds and Non-tender,    Extremities: -  edema of legs, Normal pedal and radial pulses,   Neuro:    alert, oriented, affect appropriate, speech fluent, cranial nerves intact, no focal neurological deficits and moves all extremities well    Operative Procedures:    None.    Consultants/Treatment Team:    Treatment Team: Attending Provider: Shanon Rosser, MD; Consulting Provider: Roderic Scarce, MD; Consulting Provider: Rande Brunt, MD; Consulting Provider: Shanon Rosser, MD; Care Manager: Verdene Lennert, RN    Disposition:    Home    Most Recent Labs:  Recent Results (from the past 24 hour(s))   GLUCOSE, POC    Collection Time: 05/12/19 12:15 PM   Result Value Ref Range    Glucose (POC) 202 (H) 65 - 105 mg/dL   CBC WITH AUTOMATED DIFF    Collection Time: 05/12/19  1:22 PM   Result Value Ref Range    WBC 13.0 (H) 4.0 - 11.0 1000/mm3    RBC 4.27 3.60 - 5.20 M/uL    HGB 12.6 (L) 13.0 - 17.2 gm/dl    HCT 27.2 53.6 - 64.4 %    MCV 89.5 80.0 - 98.0 fL    MCH 29.5 25.4 - 34.6 pg    MCHC 33.0 30.0 - 36.0 gm/dl    PLATELET 034 742 - 595 1000/mm3    MPV 11.4 (H) 6.0 - 10.0 fL    RDW-SD 43.1 36.4 - 46.3      NRBC 0 0 - 0      IMMATURE GRANULOCYTES 0.5 0.0 - 3.0 %    NEUTROPHILS 88.3 (H) 34 - 64 %    LYMPHOCYTES 7.5 (L) 28 - 48 %    MONOCYTES 3.4 1 - 13 %    EOSINOPHILS 0.2 0 - 5 %    BASOPHILS 0.1 0 - 3 %   GLUCOSE, POC    Collection Time: 05/12/19  5:53 PM   Result Value Ref Range    Glucose (POC) 214 (H) 65 - 105 mg/dL   GLUCOSE, POC    Collection Time: 05/12/19  9:42 PM   Result Value Ref Range    Glucose (POC) 274 (H) 65 - 105 mg/dL   CBC WITH AUTOMATED DIFF    Collection Time: 05/13/19  6:36 AM   Result Value Ref Range    WBC 12.4 (H) 4.0 - 11.0 1000/mm3    RBC 4.11 3.60 - 5.20 M/uL    HGB 12.0 (L) 13.0 - 17.2 gm/dl    HCT 63.8 (L) 75.6 - 50.0 %    MCV 89.5 80.0 - 98.0 fL    MCH 29.2 25.4 - 34.6 pg    MCHC 32.6 30.0 - 36.0 gm/dl    PLATELET 433 295 - 188 1000/mm3    MPV 11.3 (H) 6.0 - 10.0 fL    RDW-SD 43.6 36.4 - 46.3      NRBC 0 0 - 0       IMMATURE GRANULOCYTES 0.4 0.0 - 3.0 %    NEUTROPHILS 66.1 (H) 34 - 64 %    LYMPHOCYTES 22.5 (L) 28 - 48 %    MONOCYTES 9.9 1 - 13 %    EOSINOPHILS 0.9 0 - 5 %    BASOPHILS 0.2 0 - 3 %   METABOLIC PANEL, BASIC    Collection Time: 05/13/19  6:36 AM   Result Value Ref Range    Sodium 139 136 - 145 mEq/L    Potassium  4.3 3.5 - 5.1 mEq/L    Chloride 100 98 - 107 mEq/L    CO2 34 (H) 21 - 32 mEq/L    Glucose 194 (H) 74 - 106 mg/dl    BUN 20 7 - 25 mg/dl    Creatinine 1.0 0.6 - 1.3 mg/dl    GFR est AA >60.0      GFR est non-AA 59      Calcium 9.0 8.5 - 10.1 mg/dl    Anion gap 5 5 - 15 mmol/L   GLUCOSE, POC    Collection Time: 05/13/19  8:03 AM   Result Value Ref Range    Glucose (POC) 174 (H) 65 - 105 mg/dL     Recent Labs     05/13/19  0636   BUN 20   NA 139   CO2 34*     Recent Labs     05/13/19  0636   WBC 12.4*   RBC 4.11   HCT 36.8*   MCV 89.5   MCH 29.2   MCHC 32.6       XR Results:  Results from Hospital Encounter encounter on 05/07/19   XR CHEST SNGL V    Narrative EXAM:  Chest AP    INDICATIONS: SOB, hypoxia     COMPARISON: Most recently 03/13/2019.    FINDINGS:     There is an unchanged density in the left midlung zone just cranial to the known  left lower lobe nodule. This was shown to be atelectasis on previous CT. The  nodule is also unchanged in size. No new abnormalities are prominent bullous  disease at the left lung apex.    Normal cardiomediastinal contours.      Impression IMPRESSION:   1.  No acute cardiopulmonary process.  2.  Stable atelectasis and nodule in the left midlung zone.  3.  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         CT Results:  Results from [facility] encounter on 05/07/19   CT ABD PELV W CONT    Narrative DICOM format image data is available to non-affiliated external healthcare  facilities or entities on a secure, media free, reciprocally searchable basis  with patient authorization for 12 months following the date of the study.    Clinical history:   Left lower quadrant pain, elevated blood blood cells    EXAMINATION:  CT scan of the abdomen and pelvis with intravenous contrast 05/07/2019. 5 mm  spiral scanning is performed from the costophrenic angles to the symphysis  pubis. Coronal and sagittal reconstruction imaging has been obtained.    Correlation: 05/11/2018 and 02/22/2018    FINDINGS:  There is respiratory motion artifact. There are bullous changes lung bases.  Osteopenia and degenerative changes of the spine. Right hip prosthesis. Liver,  gallbladder, spleen, adrenal glands are unremarkable.    1.6 and 1.1 cm splenic hypodensities unchanged from prior study. 2 mm  nonobstructing right nephrolithiasis. Left kidney and bladder are unremarkable.  Uterus is absent.    There are colonic diverticuli. Terminal ileum, appendix, stomach and small bowel  loops are within normal limits. Atherosclerotic calcification of the abdominal  aorta. No dominant lymph node enlargement or free fluid.      Impression IMPRESSION:  1. 2 mm nonobstructing right nephrolithiasis.  2. Small stable right renal cyst.  3. Diverticulosis.  4. Hysterectomy.  5. 1.6 and 1.1 cm stable splenic lesions.         MRI Results:  Results  from Hospital Encounter encounter on 08/04/18   MRI BRAIN WO CONT    Narrative EXAMINATION: MRI BRAIN WO CONT    CLINICAL INDICATION: intractable HA         COMPARISON:  April 09, 2015    TECHNIQUE: T1 and T2-weighted sequences of the brain were obtained in multiple  planes without contrast.    FINDINGS:   No restricted diffusion. Stable left posterior fossa dural based mass 10 mm,  abutting tentorium. Isointense on T1 and T2. Findings typical of meningioma.    Stable left frontal scalp subgaleal lipoma. Small right frontal subgaleal  lipoma.  No  no midline shift. No hydrocephalus. No extra-axial fluid collections.  Visualized portions of the orbits grossly unremarkable.  Mastoid air cells well aerated. Visualized portions of paranasal sinuses   demonstrate mucosal thickening posterior right ethmoid air cells and right  sphenoid sinus.    Intracranial flow voids of great vessels, grossly intact. Extensive confluent T2  prolongation in corona radiata and centrum semiovale, more than expected for  patient's age. Extensive subcortical T2 prolongation in the frontoparietal  lobes. T2 prolongation central pons.  Marked frontotemporal lobe atrophy. No susceptibility artifact on gradient echo  imaging to suggest hemorrhage.  Cerebellar tonsil normal.               Impression IMPRESSION:  1.   Stable left posterior fossa dural based mass. Long-term stability would be  consistent with meningioma.   2. No acute infarction.  3. Extensive chronic microvascular ischemic change         Nuclear Medicine Results:  No results found for this or any previous visit.      Marnee GuarneriMustafa Ladaja Yusupov MD   [facility]  H[facility]ist  Pager: [phone]  May 13, 2019  9:12 AM

## 2019-05-13 NOTE — Progress Notes (Signed)
Discharge Plan:   home    Discharge Date:     05/13/2019     Home Health Needed:     Patient declined    DME needed and ordered for Discharge:    No    Transportation: Family spouse will transport per patient.

## 2019-05-13 NOTE — Progress Notes (Signed)
Discharge Plan:   home    Discharge Date:     05/13/2019     Home Health Needed:     Patient declined    DME needed and ordered for Discharge:    No    Transportation: Family spouse will transport per patient.

## 2019-05-13 NOTE — Discharge Summary (Signed)
Discharge Summary       Patient ID:  [name] [name],   68 y.o., female  Aug 20, 1950    PCP:  [name] Huh, PA-C    Admit Date: 05/07/2019  1:23 PM  Discharge Date:  No discharge date for patient encounter.  Length of stay: 6 day(s)  Code Status: Full Code  Discharging physician: Waneta Martins, MD    Chief Complaint   Patient presents with   ??? Abdominal Pain   ??? Cough       Discharge Medications  Current Discharge Medication List      CONTINUE these medications which have NOT CHANGED    Details   methocarbamoL (Robaxin) 500 mg tablet Take 1 Tab by mouth four (4) times daily.  Qty: 20 Tab, Refills: 0      ibuprofen (MOTRIN) 600 mg tablet Take 1 Tab by mouth every six (6) hours as needed for Pain.  Qty: 20 Tab, Refills: 0      butalbital-acetaminophen-caff (FIORICET) 50-300-40 mg per capsule Take 1 Cap by mouth every four (4) hours as needed for Headache.  Qty: 30 Cap, Refills: 0      dilTIAZem CD (CARDIZEM CD) 240 mg ER capsule Take 1 Cap by mouth daily.  Qty: 30 Cap, Refills: 1      losartan (COZAAR) 50 mg tablet Take 1 Tab by mouth daily.  Qty: 30 Tab, Refills: 1      cetirizine (ZYRTEC) 10 mg tablet Take 1 Tab by mouth daily.  Qty: 30 Tab, Refills: 1      fluticasone propionate (FLONASE) 50 mcg/actuation nasal spray 2 Sprays by Both Nostrils route daily.  Qty: 1 Bottle, Refills: 1      guaiFENesin ER (MUCINEX) 600 mg ER tablet Take 1 Tab by mouth every twelve (12) hours.  Qty: 20 Tab, Refills: 0      LANTUS SOLOSTAR U-100 INSULIN 100 unit/mL (3 mL) inpn INJECT 32 UNITS SUBCUTANEOUSLY  NIGHTLY  Qty: 15 mL, Refills: 0    Associated Diagnoses: Type 2 diabetes mellitus with diabetic neuropathy, without long-term current use of insulin (HCC)      albuterol (ACCUNEB) 1.25 mg/3 mL nebu USE 1 VIAL IN NEBULIZER EVERY 6 HOURS AS NEEDED FOR  SHORTNESS  OF  BREATH,  WHEEZING  Qty: 270 mL, Refills: 1      cloNIDine HCl (CATAPRES) 0.1 mg tablet Take 3 Tabs by mouth two (2) times a day.  Qty: 60 Tab, Refills: 1       spironolactone (ALDACTONE) 100 mg tablet TAKE 1 TABLET EVERY DAY  Qty: 90 Tab, Refills: 0    Associated Diagnoses: Severe hypertension      metFORMIN ER (GLUCOPHAGE XR) 500 mg tablet TAKE 1 TABLET TWICE DAILY  Qty: 180 Tab, Refills: 1      montelukast (SINGULAIR) 10 mg tablet TAKE 1 TABLET EVERY DAY  Qty: 90 Tab, Refills: 3      gabapentin (NEURONTIN) 300 mg capsule Take 1 Cap by mouth three (3) times daily. Max Daily Amount: 900 mg.  Qty: 270 Cap, Refills: 1    Associated Diagnoses: Chronic right hip pain      polyethylene glycol (MIRALAX) 17 gram packet Take 1 Packet by mouth daily.  Qty: 30 Packet, Refills: 1      magnesium oxide (MAG-OX) 400 mg tablet Take 400 mg by mouth daily.      QUEtiapine SR (SEROQUEL XR) 150 mg sr tablet Take 150 mg by mouth nightly as needed.  tiotropium bromide (SPIRIVA RESPIMAT) 2.5 mcg/actuation inhaler Take 1 Puff by inhalation daily.  Qty: 1 Inhaler, Refills: 0    Associated Diagnoses: COPD with acute exacerbation (HCC)      fluticasone propion-salmeterol (ADVAIR DISKUS) 100-50 mcg/dose diskus inhaler Take 1 Puff by inhalation two (2) times a day.  Qty: 1 Inhaler, Refills: 0      rosuvastatin (CRESTOR) 5 mg tablet TAKE 1 TABLET BY MOUTH NIGHTLY  Qty: 90 Tab, Refills: 1    Associated Diagnoses: Type 2 diabetes mellitus with diabetic neuropathy, without long-term current use of insulin (HCC)      traZODone (DESYREL) 50 mg tablet Take 1 Tab by mouth nightly as needed for Sleep.  Qty: 20 Tab, Refills: 0                 HPI on Admission (per admitting physician):   [name] is a 68 y.o. OTHER female who presents with not feeling well for the past few hours  Patient said she has been feeling nauseous, had some vomiting and loose bowel movement, developed progressive abdominal pain more on the left than the right  She also has been having shortness of breath with cough which is nonproductive, no fever no chest pain  She has history of COPD feels that the symptoms are very consistent to  her previous COPD exacerbations    Hospital Course:     The patient admitted for the following Principal Medical Problem:   Acute COPD exacerbation - treated with Duoneb's, IV steroid, c/w breo,singular, Spiriva    Acute hypoxic respiratory failure due to the above , improved   Abdominal pain - unclear aetiology , may related to nephrolithiasis, no acute pathology   Acute kidney injury - reated with IVF support and avoiding nephrotoxin, Cr 1.6->1.2 - Resolved        The patient feel better and want to be d/c home     Other Active Problems managed during this hospitalization:  Hypertension on Cardizem, clonidine, aldactone , losartan on hold    Type 2 DM - on glu-commander   Hyperlipidemia on statin   Peripheral neuropathy on gabapentin   History of tobacco abuse  Pulmonary nodule  Bullous emphysema  Chronic pain syndrome   GERD on PPI     The patient condition became clinically stable and No new complain or complication during the hospital course.    The Medication changes discussed with the patient and family on the discharge    Condition at discharge   Afebrile  Ambulating  Eating, Drinking, Voiding  Stable        Physical Exam on Discharge:  Visit Vitals  BP (!) 140/75 (BP 1 Location: Right arm, BP Patient Position: Supine)   Pulse 68   Temp 98.3 ??F (36.8 ??C)   Resp 16   Ht  (1.727 m)   Wt 96.3 kg (212 lb 4.9 oz)   SpO2 98%   Breastfeeding No   BMI 32.28 kg/m??       General Appearance:   Appears in no acute distress., Sitting up.,   Skin:   Skin warm & dry, No rash, No jaundice,   Lymph:  There is no lymphadenopathy,   HEENT:   PERRLA, EOMI, Moist oral mucous membranes, conjunctiva clear,   Neck:   Supple, Without masses,   Lungs:   Clear, No wheezes., No rales., Normal respiratory effort,   Heart:   Regular rate and rhythm, No gallop,   Abdomen:   Soft ,  Non-distended, Normal bowel sounds and Non-tender,   Extremities: -  edema of legs, Normal pedal and radial pulses,   Neuro:    alert, oriented, affect  appropriate, speech fluent, cranial nerves intact, no focal neurological deficits and moves all extremities well    Operative Procedures:    None.    Consultants/Treatment Team:    Treatment Team: Attending Provider: Shanon Rosser, MD; Consulting Provider: Roderic Scarce, MD; Consulting Provider: Rande Brunt, MD; Consulting Provider: Shanon Rosser, MD; Care Manager: Verdene Lennert, RN    Disposition:    Home    Most Recent Labs:  Recent Results (from the past 24 hour(s))   GLUCOSE, POC    Collection Time: 05/12/19 12:15 PM   Result Value Ref Range    Glucose (POC) 202 (H) 65 - 105 mg/dL   CBC WITH AUTOMATED DIFF    Collection Time: 05/12/19  1:22 PM   Result Value Ref Range    WBC 13.0 (H) 4.0 - 11.0 1000/mm3    RBC 4.27 3.60 - 5.20 M/uL    HGB 12.6 (L) 13.0 - 17.2 gm/dl    HCT 16.1 09.6 - 04.5 %    MCV 89.5 80.0 - 98.0 fL    MCH 29.5 25.4 - 34.6 pg    MCHC 33.0 30.0 - 36.0 gm/dl    PLATELET 409 811 - 914 1000/mm3    MPV 11.4 (H) 6.0 - 10.0 fL    RDW-SD 43.1 36.4 - 46.3      NRBC 0 0 - 0      IMMATURE GRANULOCYTES 0.5 0.0 - 3.0 %    NEUTROPHILS 88.3 (H) 34 - 64 %    LYMPHOCYTES 7.5 (L) 28 - 48 %    MONOCYTES 3.4 1 - 13 %    EOSINOPHILS 0.2 0 - 5 %    BASOPHILS 0.1 0 - 3 %   GLUCOSE, POC    Collection Time: 05/12/19  5:53 PM   Result Value Ref Range    Glucose (POC) 214 (H) 65 - 105 mg/dL   GLUCOSE, POC    Collection Time: 05/12/19  9:42 PM   Result Value Ref Range    Glucose (POC) 274 (H) 65 - 105 mg/dL   CBC WITH AUTOMATED DIFF    Collection Time: 05/13/19  6:36 AM   Result Value Ref Range    WBC 12.4 (H) 4.0 - 11.0 1000/mm3    RBC 4.11 3.60 - 5.20 M/uL    HGB 12.0 (L) 13.0 - 17.2 gm/dl    HCT 78.2 (L) 95.6 - 50.0 %    MCV 89.5 80.0 - 98.0 fL    MCH 29.2 25.4 - 34.6 pg    MCHC 32.6 30.0 - 36.0 gm/dl    PLATELET 213 086 - 578 1000/mm3    MPV 11.3 (H) 6.0 - 10.0 fL    RDW-SD 43.6 36.4 - 46.3      NRBC 0 0 - 0      IMMATURE GRANULOCYTES 0.4 0.0 - 3.0 %    NEUTROPHILS 66.1 (H) 34 - 64 %    LYMPHOCYTES 22.5 (L) 28  - 48 %    MONOCYTES 9.9 1 - 13 %    EOSINOPHILS 0.9 0 - 5 %    BASOPHILS 0.2 0 - 3 %   METABOLIC PANEL, BASIC    Collection Time: 05/13/19  6:36 AM   Result Value Ref Range    Sodium 139 136 - 145 mEq/L    Potassium  4.3 3.5 - 5.1 mEq/L    Chloride 100 98 - 107 mEq/L    CO2 34 (H) 21 - 32 mEq/L    Glucose 194 (H) 74 - 106 mg/dl    BUN 20 7 - 25 mg/dl    Creatinine 1.0 0.6 - 1.3 mg/dl    GFR est AA >39.5      GFR est non-AA 59      Calcium 9.0 8.5 - 10.1 mg/dl    Anion gap 5 5 - 15 mmol/L   GLUCOSE, POC    Collection Time: 05/13/19  8:03 AM   Result Value Ref Range    Glucose (POC) 174 (H) 65 - 105 mg/dL     Recent Labs     32/02/33  0636   BUN 20   NA 139   CO2 34*     Recent Labs     05/13/19  0636   WBC 12.4*   RBC 4.11   HCT 36.8*   MCV 89.5   MCH 29.2   MCHC 32.6       XR Results:  Results from Hospital Encounter encounter on 05/07/19   XR CHEST SNGL V    Narrative EXAM:  Chest AP    INDICATIONS: SOB, hypoxia     COMPARISON: Most recently 03/13/2019.    FINDINGS:     There is an unchanged density in the left midlung zone just cranial to the known  left lower lobe nodule. This was shown to be atelectasis on previous CT. The  nodule is also unchanged in size. No new abnormalities are prominent bullous  disease at the left lung apex.    Normal cardiomediastinal contours.      Impression IMPRESSION:   1.  No acute cardiopulmonary process.  2.  Stable atelectasis and nodule in the left midlung zone.  3.  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         CT Results:  Results from Hospital Encounter encounter on 05/07/19   CT ABD PELV W CONT    Narrative DICOM format image data is available to non-affiliated external healthcare  facilities or entities on a secure, media free, reciprocally searchable basis  with patient authorization for 12 months following the date of the study.    Clinical history:  Left lower quadrant pain, elevated blood blood cells    EXAMINATION:  CT scan of the abdomen and pelvis with  intravenous contrast 05/07/2019. 5 mm  spiral scanning is performed from the costophrenic angles to the symphysis  pubis. Coronal and sagittal reconstruction imaging has been obtained.    Correlation: 05/11/2018 and 02/22/2018    FINDINGS:  There is respiratory motion artifact. There are bullous changes lung bases.  Osteopenia and degenerative changes of the spine. Right hip prosthesis. Liver,  gallbladder, spleen, adrenal glands are unremarkable.    1.6 and 1.1 cm splenic hypodensities unchanged from prior study. 2 mm  nonobstructing right nephrolithiasis. Left kidney and bladder are unremarkable.  Uterus is absent.    There are colonic diverticuli. Terminal ileum, appendix, stomach and small bowel  loops are within normal limits. Atherosclerotic calcification of the abdominal  aorta. No dominant lymph node enlargement or free fluid.      Impression IMPRESSION:  1. 2 mm nonobstructing right nephrolithiasis.  2. Small stable right renal cyst.  3. Diverticulosis.  4. Hysterectomy.  5. 1.6 and 1.1 cm stable splenic lesions.         MRI Results:  Results  from Hospital Encounter encounter on 08/04/18   MRI BRAIN WO CONT    Narrative EXAMINATION: MRI BRAIN WO CONT    CLINICAL INDICATION: intractable HA         COMPARISON:  April 09, 2015    TECHNIQUE: T1 and T2-weighted sequences of the brain were obtained in multiple  planes without contrast.    FINDINGS:   No restricted diffusion. Stable left posterior fossa dural based mass 10 mm,  abutting tentorium. Isointense on T1 and T2. Findings typical of meningioma.    Stable left frontal scalp subgaleal lipoma. Small right frontal subgaleal  lipoma.  No  no midline shift. No hydrocephalus. No extra-axial fluid collections.  Visualized portions of the orbits grossly unremarkable.  Mastoid air cells well aerated. Visualized portions of paranasal sinuses  demonstrate mucosal thickening posterior right ethmoid air cells and right  sphenoid sinus.    Intracranial flow voids of  great vessels, grossly intact. Extensive confluent T2  prolongation in corona radiata and centrum semiovale, more than expected for  patient's age. Extensive subcortical T2 prolongation in the frontoparietal  lobes. T2 prolongation central pons.  Marked frontotemporal lobe atrophy. No susceptibility artifact on gradient echo  imaging to suggest hemorrhage.  Cerebellar tonsil normal.               Impression IMPRESSION:  1.   Stable left posterior fossa dural based mass. Long-term stability would be  consistent with meningioma.   2. No acute infarction.  3. Extensive chronic microvascular ischemic change         Nuclear Medicine Results:  No results found for this or any previous visit.      Marnee GuarneriMustafa Ayelet Gruenewald MD   [facility]  Hospitalist  Pager: [phone]  May 13, 2019  9:12 AM

## 2019-05-16 LAB — CULTURE, BLOOD 1
BLOOD CULTURE RESULT: NO GROWTH
BLOOD CULTURE RESULT: NO GROWTH

## 2019-05-16 LAB — CULTURE, BLOOD
Blood Culture Result: NO GROWTH
Blood Culture Result: NO GROWTH

## 2019-05-17 ENCOUNTER — Encounter: Payer: MEDICARE | Attending: Orthopaedic Surgery | Primary: Physician Assistant

## 2019-06-01 ENCOUNTER — Emergency Department: Admit: 2019-06-01 | Payer: MEDICARE | Primary: Physician Assistant

## 2019-06-01 ENCOUNTER — Inpatient Hospital Stay
Admit: 2019-06-01 | Discharge: 2019-06-05 | Disposition: A | Discharge status: Home Health | Payer: MEDICARE | Attending: Internal Medicine | Admitting: Internal Medicine

## 2019-06-01 DIAGNOSIS — J189 Pneumonia, unspecified organism: Secondary | ICD-10-CM

## 2019-06-01 LAB — CBC WITH AUTO DIFFERENTIAL
Basophils %: 0.4 % (ref 0–3)
Eosinophils %: 1.4 % (ref 0–5)
Hematocrit: 43.7 % (ref 37.0–50.0)
Hemoglobin: 14.6 gm/dl (ref 13.0–17.2)
Immature Granulocytes: 0.4 % (ref 0.0–3.0)
Lymphocytes %: 27.5 % — ABNORMAL LOW (ref 28–48)
MCH: 30 pg (ref 25.4–34.6)
MCHC: 33.4 gm/dl (ref 30.0–36.0)
MCV: 89.9 fL (ref 80.0–98.0)
MPV: 10.7 fL — ABNORMAL HIGH (ref 6.0–10.0)
Monocytes %: 8.6 % (ref 1–13)
Neutrophils %: 61.7 % (ref 34–64)
Nucleated RBCs: 0 (ref 0–0)
Platelets: 233 10*3/uL (ref 140–450)
RBC: 4.86 M/uL (ref 3.60–5.20)
RDW-SD: 43.7 (ref 36.4–46.3)
WBC: 11.2 10*3/uL — ABNORMAL HIGH (ref 4.0–11.0)

## 2019-06-01 LAB — POC URINE MACROSCOPIC
Glucose, Ur: NEGATIVE mg/dl
Glucose: NEGATIVE mg/dl
Ketone: NEGATIVE mg/dl
Ketones, Urine: NEGATIVE mg/dl
Nitrite, Urine: NEGATIVE
Nitrites: NEGATIVE
Protein, UA: 30 mg/dl — AB
Protein: 30 mg/dl — AB
Specific Gravity, UA: 1.025 (ref 1.005–1.030)
Specific gravity: 1.025 (ref 1.005–1.030)
Urobilinogen, UA, POCT: 0.2 EU/dl (ref 0.0–1.0)
Urobilinogen: 0.2 EU/dl (ref 0.0–1.0)
pH (UA): 5 (ref 5–9)
pH, UA: 5 (ref 5–9)

## 2019-06-01 LAB — POC URINE MICROSCOPIC

## 2019-06-01 LAB — BASIC METABOLIC PANEL
Anion Gap: 6 mmol/L (ref 5–15)
BUN: 19 mg/dl (ref 7–25)
CO2: 30 mEq/L (ref 21–32)
Calcium: 10.1 mg/dl (ref 8.5–10.1)
Chloride: 108 mEq/L — ABNORMAL HIGH (ref 98–107)
Creatinine: 1.2 mg/dl (ref 0.6–1.3)
EGFR IF NonAfrican American: 48
GFR African American: 58
Glucose: 119 mg/dl — ABNORMAL HIGH (ref 74–106)
Potassium: 4.6 mEq/L (ref 3.5–5.1)
Sodium: 144 mEq/L (ref 136–145)

## 2019-06-01 LAB — LEGIONELLA ANTIGEN, URINE: Legionella Antigen, Urine: NEGATIVE

## 2019-06-01 LAB — S.PNEUMO AG, UR/CSF
Strep pneumo Ag, urine: NEGATIVE
Strep pneumo Ag, urine: NEGATIVE

## 2019-06-01 LAB — PROBNP, N-TERMINAL: BNP: 11 pg/ml (ref 0.0–125.0)

## 2019-06-01 LAB — CBC WITH AUTOMATED DIFF
BASOPHILS: 0.4 % (ref 0–3)
EOSINOPHILS: 1.4 % (ref 0–5)
HCT: 43.7 % (ref 37.0–50.0)
HGB: 14.6 gm/dl (ref 13.0–17.2)
IMMATURE GRANULOCYTES: 0.4 % (ref 0.0–3.0)
LYMPHOCYTES: 27.5 % — ABNORMAL LOW (ref 28–48)
MCH: 30 pg (ref 25.4–34.6)
MCHC: 33.4 gm/dl (ref 30.0–36.0)
MCV: 89.9 fL (ref 80.0–98.0)
MONOCYTES: 8.6 % (ref 1–13)
MPV: 10.7 fL — ABNORMAL HIGH (ref 6.0–10.0)
NEUTROPHILS: 61.7 % (ref 34–64)
NRBC: 0 (ref 0–0)
PLATELET: 233 10*3/uL (ref 140–450)
RBC: 4.86 M/uL (ref 3.60–5.20)
RDW-SD: 43.7 (ref 36.4–46.3)
WBC: 11.2 10*3/uL — ABNORMAL HIGH (ref 4.0–11.0)

## 2019-06-01 LAB — METABOLIC PANEL, BASIC
Anion gap: 6 mmol/L (ref 5–15)
BUN: 19 mg/dl (ref 7–25)
CO2: 30 mEq/L (ref 21–32)
Calcium: 10.1 mg/dl (ref 8.5–10.1)
Chloride: 108 mEq/L — ABNORMAL HIGH (ref 98–107)
Creatinine: 1.2 mg/dl (ref 0.6–1.3)
GFR est AA: 58
GFR est non-AA: 48
Glucose: 119 mg/dl — ABNORMAL HIGH (ref 74–106)
Potassium: 4.6 mEq/L (ref 3.5–5.1)
Sodium: 144 mEq/L (ref 136–145)

## 2019-06-01 LAB — NT-PRO BNP: NT pro-BNP: 11 pg/ml (ref 0.0–125.0)

## 2019-06-01 LAB — LEGIONELLA PNEUMOPHILA AG, URINE: Legionella Ag, urine: NEGATIVE

## 2019-06-01 LAB — SARS-COV-2_FLU
COVID-19: NEGATIVE
Influenza A PCR: NEGATIVE
Influenza B PCR: NEGATIVE

## 2019-06-01 MED ORDER — SPIRONOLACTONE 25 MG TAB
25 mg | Freq: Every day | ORAL | Status: DC
Start: 2019-06-01 — End: 2019-06-05
  Administered 2019-06-02 – 2019-06-03 (×2): via ORAL

## 2019-06-01 MED ORDER — SODIUM CHLORIDE 0.9 % IJ SYRG
INTRAMUSCULAR | Status: DC | PRN
Start: 2019-06-01 — End: 2019-06-05

## 2019-06-01 MED ORDER — ACETAMINOPHEN 325 MG TABLET
325 mg | Freq: Four times a day (QID) | ORAL | Status: DC | PRN
Start: 2019-06-01 — End: 2019-06-05
  Administered 2019-06-02 – 2019-06-04 (×9): via ORAL

## 2019-06-01 MED ORDER — BUTALBITAL-ACETAMINOPHEN-CAFFEINE 50 MG-325 MG-40 MG TAB
50-325-40 mg | ORAL | Status: DC | PRN
Start: 2019-06-01 — End: 2019-06-05
  Administered 2019-06-03 – 2019-06-05 (×5): via ORAL

## 2019-06-01 MED ORDER — CEFTRIAXONE 1 GRAM SOLUTION FOR INJECTION
1 gram | INTRAMUSCULAR | Status: DC
Start: 2019-06-01 — End: 2019-06-04
  Administered 2019-06-01 – 2019-06-03 (×3): via INTRAVENOUS

## 2019-06-01 MED ORDER — IPRATROPIUM-ALBUTEROL 2.5 MG-0.5 MG/3 ML NEB SOLUTION
2.5 mg-0.5 mg/3 ml | RESPIRATORY_TRACT | Status: AC
Start: 2019-06-01 — End: 2019-06-01
  Administered 2019-06-01: 19:00:00 via RESPIRATORY_TRACT

## 2019-06-01 MED ORDER — SODIUM CHLORIDE 0.9 % IV
500 mg | INTRAVENOUS | Status: DC
Start: 2019-06-01 — End: 2019-06-04
  Administered 2019-06-01 – 2019-06-03 (×3): via INTRAVENOUS

## 2019-06-01 MED ORDER — MORPHINE 4 MG/ML SYRINGE
4 mg/mL | INTRAMUSCULAR | Status: AC
Start: 2019-06-01 — End: 2019-06-01
  Administered 2019-06-01: 19:00:00 via INTRAVENOUS

## 2019-06-01 MED ORDER — MONTELUKAST 10 MG TAB
10 mg | Freq: Every evening | ORAL | Status: DC
Start: 2019-06-01 — End: 2019-06-05
  Administered 2019-06-02 – 2019-06-05 (×5): via ORAL

## 2019-06-01 MED ORDER — DILTIAZEM ER 240 MG 24 HR CAP
240 mg | Freq: Every day | ORAL | Status: DC
Start: 2019-06-01 — End: 2019-06-05
  Administered 2019-06-02 – 2019-06-05 (×4): via ORAL

## 2019-06-01 MED ORDER — SODIUM CHLORIDE 0.9 % IJ SYRG
Freq: Three times a day (TID) | INTRAMUSCULAR | Status: DC
Start: 2019-06-01 — End: 2019-06-05
  Administered 2019-06-01 – 2019-06-05 (×14): via INTRAVENOUS

## 2019-06-01 MED ORDER — IPRATROPIUM-ALBUTEROL 2.5 MG-0.5 MG/3 ML NEB SOLUTION
2.5 mg-0.5 mg/3 ml | Freq: Four times a day (QID) | RESPIRATORY_TRACT | Status: DC
Start: 2019-06-01 — End: 2019-06-05
  Administered 2019-06-01 – 2019-06-05 (×15): via RESPIRATORY_TRACT

## 2019-06-01 MED ORDER — IOPAMIDOL 61 % IV SOLN
61 % | Freq: Once | INTRAVENOUS | Status: AC
Start: 2019-06-01 — End: 2019-06-01
  Administered 2019-06-01: 19:00:00 via INTRAVENOUS

## 2019-06-01 MED ORDER — NALOXONE 0.4 MG/ML INJECTION
0.4 mg/mL | INTRAMUSCULAR | Status: DC | PRN
Start: 2019-06-01 — End: 2019-06-05

## 2019-06-01 MED ORDER — QUETIAPINE SR 50 MG 24 HR TAB
50 mg | Freq: Every evening | ORAL | Status: DC | PRN
Start: 2019-06-01 — End: 2019-06-05
  Administered 2019-06-03: 04:00:00 via ORAL

## 2019-06-01 MED ORDER — ROSUVASTATIN 5 MG TAB
5 mg | Freq: Every evening | ORAL | Status: DC
Start: 2019-06-01 — End: 2019-06-05
  Administered 2019-06-02 – 2019-06-05 (×5): via ORAL

## 2019-06-01 MED ORDER — CEFTRIAXONE 1 GRAM SOLUTION FOR INJECTION
1 gram | INTRAMUSCULAR | Status: DC
Start: 2019-06-01 — End: 2019-06-01

## 2019-06-01 MED ORDER — FLUTICASONE-SALMETEROL 100 MCG-50 MCG/DOSE DISK DEVICE FOR INHALATION
100-50 mcg/dose | Freq: Two times a day (BID) | RESPIRATORY_TRACT | Status: DC
Start: 2019-06-01 — End: 2019-06-01

## 2019-06-01 MED ORDER — TRAZODONE 50 MG TAB
50 mg | Freq: Every evening | ORAL | Status: DC | PRN
Start: 2019-06-01 — End: 2019-06-05
  Administered 2019-06-03: 04:00:00 via ORAL

## 2019-06-01 MED ORDER — ONDANSETRON (PF) 4 MG/2 ML INJECTION
4 mg/2 mL | Freq: Once | INTRAMUSCULAR | Status: AC
Start: 2019-06-01 — End: 2019-06-01
  Administered 2019-06-01: 17:00:00 via INTRAVENOUS

## 2019-06-01 MED ORDER — TIOTROPIUM BROMIDE 2.5 MCG/ACTUATION MIST FOR INHALATION
2.5 mcg/actuation | Freq: Every day | RESPIRATORY_TRACT | Status: DC
Start: 2019-06-01 — End: 2019-06-05
  Administered 2019-06-02 – 2019-06-03 (×2): via RESPIRATORY_TRACT

## 2019-06-01 MED ORDER — GABAPENTIN 300 MG CAP
300 mg | Freq: Three times a day (TID) | ORAL | Status: DC
Start: 2019-06-01 — End: 2019-06-05
  Administered 2019-06-01 – 2019-06-05 (×13): via ORAL

## 2019-06-01 MED ORDER — HEPARIN (PORCINE) 5,000 UNIT/ML IJ SOLN
5000 unit/mL | Freq: Two times a day (BID) | INTRAMUSCULAR | Status: DC
Start: 2019-06-01 — End: 2019-06-05
  Administered 2019-06-01 – 2019-06-05 (×8): via SUBCUTANEOUS

## 2019-06-01 MED ORDER — METHYLPREDNISOLONE (PF) 125 MG/2 ML IJ SOLR
125 mg/2 mL | Freq: Once | INTRAMUSCULAR | Status: AC
Start: 2019-06-01 — End: 2019-06-01
  Administered 2019-06-01: 19:00:00 via INTRAVENOUS

## 2019-06-01 MED ORDER — GUAIFENESIN 600 MG TABLET,EXTENDED RELEASE BIPHASIC 12 HR
600 mg | Freq: Two times a day (BID) | ORAL | Status: DC
Start: 2019-06-01 — End: 2019-06-05
  Administered 2019-06-02 – 2019-06-05 (×8): via ORAL

## 2019-06-01 MED ORDER — MORPHINE 4 MG/ML SYRINGE
4 mg/mL | INTRAMUSCULAR | Status: AC
Start: 2019-06-01 — End: 2019-06-01
  Administered 2019-06-01: 17:00:00 via INTRAVENOUS

## 2019-06-01 MED ORDER — MAGNESIUM OXIDE 400 MG TAB
400 mg | Freq: Every day | ORAL | Status: DC
Start: 2019-06-01 — End: 2019-06-05
  Administered 2019-06-02 – 2019-06-05 (×4): via ORAL

## 2019-06-01 MED ORDER — METHOCARBAMOL 500 MG TAB
500 mg | Freq: Four times a day (QID) | ORAL | Status: DC
Start: 2019-06-01 — End: 2019-06-04
  Administered 2019-06-01 – 2019-06-04 (×10): via ORAL

## 2019-06-01 MED ORDER — CLONIDINE 0.1 MG TAB
0.1 mg | Freq: Two times a day (BID) | ORAL | Status: DC
Start: 2019-06-01 — End: 2019-06-05
  Administered 2019-06-02 – 2019-06-05 (×8): via ORAL

## 2019-06-01 MED ORDER — METHYLPREDNISOLONE (PF) 40 MG/ML IJ SOLR
40 mg/mL | Freq: Three times a day (TID) | INTRAMUSCULAR | Status: DC
Start: 2019-06-01 — End: 2019-06-03
  Administered 2019-06-02 – 2019-06-03 (×6): via INTRAVENOUS

## 2019-06-01 MED ORDER — LOSARTAN 50 MG TAB
50 mg | Freq: Every day | ORAL | Status: DC
Start: 2019-06-01 — End: 2019-06-05
  Administered 2019-06-02 – 2019-06-03 (×2): via ORAL

## 2019-06-01 MED ORDER — SODIUM CHLORIDE 0.9 % IJ SYRG
Freq: Once | INTRAMUSCULAR | Status: AC
Start: 2019-06-01 — End: 2019-06-01
  Administered 2019-06-01: 17:00:00 via INTRAVENOUS

## 2019-06-01 MED ORDER — POLYETHYLENE GLYCOL 3350 17 GRAM (100 %) ORAL POWDER PACKET
17 gram | Freq: Every day | ORAL | Status: DC | PRN
Start: 2019-06-01 — End: 2019-06-04
  Administered 2019-06-02: 13:00:00 via ORAL

## 2019-06-01 MED FILL — ISOVUE-300  61 % INTRAVENOUS SOLUTION: 300 mg iodine /mL (61 %) | INTRAVENOUS | Qty: 100

## 2019-06-01 MED FILL — IPRATROPIUM-ALBUTEROL 2.5 MG-0.5 MG/3 ML NEB SOLUTION: 2.5 mg-0.5 mg/3 ml | RESPIRATORY_TRACT | Qty: 3

## 2019-06-01 MED FILL — ONDANSETRON (PF) 4 MG/2 ML INJECTION: 4 mg/2 mL | INTRAMUSCULAR | Qty: 2

## 2019-06-01 MED FILL — HEPARIN (PORCINE) 5,000 UNIT/ML IJ SOLN: 5000 unit/mL | INTRAMUSCULAR | Qty: 1

## 2019-06-01 MED FILL — CEFTRIAXONE 1 GRAM SOLUTION FOR INJECTION: 1 gram | INTRAMUSCULAR | Qty: 1

## 2019-06-01 MED FILL — GABAPENTIN 300 MG CAP: 300 mg | ORAL | Qty: 1

## 2019-06-01 MED FILL — MORPHINE 4 MG/ML SYRINGE: 4 mg/mL | INTRAMUSCULAR | Qty: 1

## 2019-06-01 MED FILL — METHOCARBAMOL 500 MG TAB: 500 mg | ORAL | Qty: 1

## 2019-06-01 MED FILL — AZITHROMYCIN 500 MG IV SOLUTION: 500 mg | INTRAVENOUS | Qty: 5

## 2019-06-01 MED FILL — SOLU-MEDROL (PF) 125 MG/2 ML SOLUTION FOR INJECTION: 125 mg/2 mL | INTRAMUSCULAR | Qty: 2
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
## 2019-06-01 NOTE — Progress Notes (Signed)
TRANSFER - OUT REPORT:    Verbal report given to Beatrice(name) on [name] [name]  being transferred to [facility](unit) for routine progression of care       Report consisted of patient???s Situation, Background, Assessment and   Recommendations(SBAR).     Information from the following report(s) SBAR was reviewed with the receiving nurse.    Lines:   Peripheral IV 06/01/19 Right Forearm (Active)   Site Assessment Clean, dry, & intact 06/01/19 1155   Phlebitis Assessment 0 06/01/19 1155   Infiltration Assessment 0 06/01/19 1155   Dressing Status Clean, dry, & intact 06/01/19 1155        Opportunity for questions and clarification was provided.      Patient transported with:   Monitor  O2 @ 2 liters  Registered Nurse

## 2019-06-01 NOTE — ED Triage Notes (Signed)
Patient complains of left groin pain that stated yesterday, when asked patient states it is her hip, but when asked where the pain is she points at her groin.

## 2019-06-01 NOTE — Progress Notes (Signed)
Pt in pain, medicated as ordered.   Remains in NAD, watching tv.

## 2019-06-01 NOTE — Progress Notes (Signed)
Assumed care of pt  Pt returned from CT  Pt medicated as ordered.  Alert and oriented.   Pt was reportedly placed on NC as she was 88% RA, hx of asthma, breathing tx running at this time.
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
## 2019-06-01 NOTE — Progress Notes (Signed)
Pt crying in pain, will notify pvr  abx infusing at this time and 2nd breathing tx.

## 2019-06-01 NOTE — Progress Notes (Signed)
Assumed care of pt  Pt returned from CT  Pt medicated as ordered.  Alert and oriented.   Pt was reportedly placed on NC as she was 88% RA, hx of asthma, breathing tx running at this time.
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
## 2019-06-01 NOTE — Progress Notes (Signed)
Pt in pain, medicated as ordered.   Remains in NAD, watching tv.

## 2019-06-01 NOTE — H&P (Signed)
H&P by Richarda Overlie, DO at 06/01/19 1428                Author: Richarda Overlie, DO  Service: Hospitalist  Author Type: Physician       Filed: 06/01/19 1547  Date of Service: 06/01/19 1428  Status: Addendum          Editor: Richarda Overlie, DO (Physician)          Related Notes: Original Note by Richarda Overlie, DO (Physician) filed at 06/01/19 1546                          Admission History and Physical   Richarda Overlie D.O.           Patient: [name] [name]  Age: 68 y.o.  Sex: female          Date of Birth: 27-May-1951  Admit Date: 06/01/2019  Admit Doctor: No admitting provider for patient encounter.         MRN: [id number]   CSN: [id number]   PCP: [name], PA-C             Assessment / Plan        COPD with AE   Lingular infiltrate, consistent with pneumonia   Acute hypoxic RF 88% RA   Urinary tract infection with urinary retention   Insulin-dependent diabetes mellitus type 2 uncontrolled with hyperglycemia   Peripheral neuropathy   Hyperlipidemia   Hypertension   History of tobacco abuse   Bullous emphysema   Chronic pain syndrome   GERD   Known 2 cm left lower lobe nodule         Plan:   -Admit to telemetry   -Check Covid   -Start azithromycin and ceftriaxone, check urine strep Legionella sputum culture   -Check urine culture and continue ceftriaxone for coverage of urinary tract infection   -Continue Solu-Medrol, duo nebs and remainder of home breathing regimen including Singulair, albuterol, Mucinex, Advair, Spiriva   Continue diabetes regimen with Glucomander protocol,-Neurontin for neuropathy   -Continue Crestor for hyperlipidemia   -Continue hypertension regimen including diltiazem, losartan, clonidine, spironolactone   Continue Seroquel nightly along with trazodone         Diet: Cardiac   DVT ppx: Heparin         DISPO   -Pt to be admitted  at this time for reasons addressed above, continued hospitalization for ongoing assessment and treatment indicated       Anticipated Date of  Discharge: 2 to 3 days   Anticipated Disposition (home, SNF) : Home      Code Status: Full code      MPOA: Heywood Iles husband [phone]         Chief Complaint:      Chief Complaint       Patient presents with        ?  Hip Pain        ?  Groin Pain                HPI:     [name] [name] is a 68 y.o.  year old female extensive past medical history including COPD with bullous emphysema, hypertension, insulin-dependent diabetes, hyperlipidemia,  peripheral neuropathy, history of tobacco abuse, known pulmonary nodule, chronic pain syndrome and GERD who presents from home today with concern of left groin pain that is dull aching throbbing in nature that she thinks is coming from her hip but  she  has known arthritis she also endorses coughing, chest tightness and occasional shortness of breath with exertion has been worsening for the last few days.  Of note patient was just hospitalized here from November 24 November 30 for COPD exacerbation during  which time she required supplemental oxygen, frequent breathing treatments and IV steroids, she was also treated for an acute kidney injury and she also had mentioned abdominal pain which is apparently chronic.  Denies any constipation or diarrhea, no  blood in her bowel movements.  No vomiting that she knows of.  No fever or chills and no Covid contacts that she is aware of.   ER course: Patient somewhat hypoxic 88 to 90% on room air started on 2 L nasal cannula with improvement to 93%.  Was somewhat tachypneic and wheezing  given steroids and DuoNeb therapy with mild improvement, white blood cell count elevated 11.2 metabolic work-up reveals normal creatinine today improved from last admission.  CT of the abdomen and pelvis done due to the concern of left groin/left lower  quadrant abdominal pain which revealed sigmoid diverticula without evidence of diverticulitis, there is also note of bibasilar bullous disease in her lungs which is not new.  Urinalysis was done which  revealed 1+ bacteria small leukocyte Estrace and she  had approximately 250 cc post void residual on bladder scan.  Patient was started on antibiotic therapy for urinary tract infection with urine culture with ceftriaxone.  I added azithromycin after I requested chest x-ray which reveals lingular infiltrate.   Covid swab pending.      Review of Systems -    Review of Systems    Constitutional: Positive for malaise/fatigue. Negative for chills and fever.    HENT: Negative for congestion, ear pain, sinus pain and sore throat.     Eyes: Negative for blurred vision and photophobia.    Respiratory: Positive for cough, shortness of breath  and wheezing.     Cardiovascular: Negative for chest pain, palpitations and leg swelling.    Gastrointestinal: Positive for abdominal pain. Negative for constipation, diarrhea, heartburn, nausea and vomiting.    Genitourinary: Negative for dysuria and flank pain.    Musculoskeletal: Positive for joint pain. Negative for myalgias.         Left hip pain    Skin: Negative for itching and rash.    Neurological: Negative for dizziness, weakness and headaches.    Endo/Heme/Allergies: Negative for polydipsia.    Psychiatric/Behavioral: Negative for depression. The patient is not nervous/anxious.              Past Medical History:     Past Medical History:        Diagnosis  Date         ?  Arthritis       ?  Chronic pain syndrome            related to R hip replacement         ?  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  DM2 (diabetes mellitus, type 2) (HCC)       ?  GERD (gastroesophageal reflux disease)       ?  Hepatitis C            HEP C         ?  HTN (hypertension)       ?  Lung nodule, multiple            (CT 10/2016) Small left upper lobe and 1.7 x 1.6 cm left lower lobe nodules not significantly changed from 07/23/2016 and 03/22/2016         ?  Marijuana use       ?  PTSD (post-traumatic stress disorder)            lived  through the [place] in 1993         ?  Thoracic ascending aortic aneurysm ([place])            4.4cm noted on CT Chest (10/2016)         ?  Thyroid nodule            2.5 cm stable right thyroid nodule (CT 10/2016)           Past Surgical History:     Past Surgical History:         Procedure  Laterality  Date          ?  HX ENDOSCOPY         ?  HX HERNIA REPAIR    2019          x2          ?  HX HIP REPLACEMENT  Right  01/29/2015     ?  HX HIP REPLACEMENT         ?  HX HYSTERECTOMY    2010          hysterectomy           Family History:     Family History         Problem  Relation  Age of Onset          ?  Hypertension  Mother       ?  Other  Mother                OSA          ?  Cancer  Father                suspected leukemia per pt's spouse          ?  Cancer  Maternal Aunt            ?  Alcohol abuse  Maternal Grandfather             Social History:     Social History          Socioeconomic History         ?  Marital status:  MARRIED              Spouse name:  Not on file         ?  Number of children:  Not on file     ?  Years of education:  Not on file     ?  Highest education level:  Not on file       Tobacco Use         ?  Smoking status:  Former Smoker     ?  Smokeless tobacco:  Never Used        ?  Tobacco comment: reported as quit smoking around 1990s per spouse       Substance and Sexual Activity         ?  Alcohol use:  No     ?  Drug use:  Not Currently              Types:  Marijuana             Comment: states she has not used marijuana in months         ?  Sexual activity:  Never           Home Medications:     Prior to Admission medications             Medication  Sig  Start Date  End Date  Taking?  Authorizing Provider            methocarbamoL (Robaxin) 500 mg tablet  Take 1 Tab by mouth four (4) times daily.  04/10/19      Sahim, Katie, PA     ibuprofen (MOTRIN) 600 mg tablet  Take 1 Tab by mouth every six (6) hours as needed for Pain.  04/01/19      Renella CunasHendrick, Bryan P, PA-C      butalbital-acetaminophen-caff (FIORICET) 50-300-40 mg per capsule  Take 1 Cap by mouth every four (4) hours as needed for Headache.  03/13/19      Johny DrillingParker, Todd A, MD     dilTIAZem CD (CARDIZEM CD) 240 mg ER capsule  Take 1 Cap by mouth daily.  09/07/18      Alvy BimlerMunoz, Marc E, MD     losartan (COZAAR) 50 mg tablet  Take 1 Tab by mouth daily.  09/07/18      Alvy BimlerMunoz, Marc E, MD     cetirizine (ZYRTEC) 10 mg tablet  Take 1 Tab by mouth daily.  09/08/18      Alvy BimlerMunoz, Marc E, MD     fluticasone propionate (FLONASE) 50 mcg/actuation nasal spray  2 Sprays by Both Nostrils route daily.   Patient taking differently: 2 Sprays by Both Nostrils route daily as needed.  09/07/18      Alvy BimlerMunoz, Marc E, MD     guaiFENesin ER (MUCINEX) 600 mg ER tablet  Take 1 Tab by mouth every twelve (12) hours.  09/07/18      Alvy BimlerMunoz, Marc E, MD     LANTUS SOLOSTAR U-100 INSULIN 100 unit/mL (3 mL) inpn  INJECT 32 UNITS SUBCUTANEOUSLY  NIGHTLY  06/12/18      Lennox GrumblesBrock, Monique, PA-C     albuterol (ACCUNEB) 1.25 mg/3 mL nebu  USE 1 VIAL IN NEBULIZER EVERY 6 HOURS AS NEEDED FOR  SHORTNESS  OF  BREATH,  WHEEZING  05/30/18      Lennox GrumblesBrock, Monique, PA-C     cloNIDine HCl (CATAPRES) 0.1 mg tablet  Take 3 Tabs by mouth two (2) times a day.  05/20/18      Lennox GrumblesBrock, Monique, PA-C     spironolactone (ALDACTONE) 100 mg tablet  TAKE 1 TABLET EVERY DAY  04/27/18      Lennox GrumblesBrock, Monique, PA-C     metFORMIN ER (GLUCOPHAGE XR) 500 mg tablet  TAKE 1 TABLET TWICE DAILY  03/30/18      Lennox GrumblesBrock, Monique, PA-C     montelukast (SINGULAIR) 10 mg tablet  TAKE 1 TABLET EVERY DAY  03/26/18      Lennox GrumblesBrock, Monique, PA-C     gabapentin (NEURONTIN) 300 mg capsule  Take 1 Cap by mouth three (3) times daily. Max Daily Amount: 900 mg.  03/01/18      Lennox GrumblesBrock, Monique, PA-C            polyethylene glycol [facility](MIRALAX) 17 gram packet  Take 1  Packet by mouth daily.   Patient taking differently: Take 17 g by mouth daily as needed for Constipation.  02/24/18      Mehmood, Khalid, MD            magnesium oxide (MAG-OX) 400 mg tablet   Take 400 mg by mouth daily.        Provider, Historical     QUEtiapine SR (SEROQUEL XR) 150 mg sr tablet  Take 150 mg by mouth nightly as needed.        Provider, Historical     tiotropium bromide (SPIRIVA RESPIMAT) 2.5 mcg/actuation inhaler  Take 1 Puff by inhalation daily.  10/11/17      Holladay, Versie Starks, MD     fluticasone propion-salmeterol (ADVAIR DISKUS) 100-50 mcg/dose diskus inhaler  Take 1 Puff by inhalation two (2) times a day.  10/11/17      Holladay, Versie Starks, MD     traZODone (DESYREL) 50 mg tablet  Take 1 Tab by mouth nightly as needed for Sleep.  08/09/17      Linward Natal, MD            rosuvastatin (CRESTOR) 5 mg tablet  TAKE 1 TABLET BY MOUTH NIGHTLY  07/13/17      Littie Deeds, MD           Allergies:     Allergies        Allergen  Reactions         ?  Chocolate [Cocoa]  Sneezing             Confirms allergy but reports "I still eat it"                Physical Exam:        Visit Vitals      BP  (!) 132/94     Pulse  98     Temp  99.2 ??F (37.3 ??C)     Resp  22     Ht  5\' 7"  (1.702 m)     Wt  86.2 kg (190 lb)     SpO2  98%        BMI  29.76 kg/m??        Physical Exam   Constitutional :        General: She is not in acute distress.     Appearance: She is not diaphoretic.    HENT:       Head: Normocephalic and atraumatic.      Right Ear: External ear normal.      Left Ear: External ear normal.      Nose: Nose normal.      Mouth/Throat:      Dentition: Normal dentition.   Eyes:       General:         Right eye: No discharge.         Left eye: No discharge.      Conjunctiva/sclera: Conjunctivae normal.      Pupils: Pupils are equal, round, and reactive to light.   Neck:       Musculoskeletal: Neck supple.      Thyroid: No thyromegaly.      Vascular: No JVD.    Cardiovascular:       Rate and Rhythm: Normal rate and regular rhythm.      Heart sounds: Normal heart sounds. No murmur. No friction rub.    Pulmonary:       Effort: Pulmonary effort is  normal. Tachypnea present. No respiratory distress.       Breath sounds: Examination of the right-upper  field reveals wheezing. Examination of the left-upper field reveals wheezing . Wheezing present.   Abdominal:      General:  Bowel sounds are normal. There is no distension.      Palpations: Abdomen is soft.      Tenderness: There is abdominal tenderness  in the left lower quadrant. There is no guarding or rebound.     Musculoskeletal: Normal range of motion.      Comments: Positive FABER of L hip     Skin:      General: Skin is warm and dry.   Neurological :       Mental Status: She is alert and oriented to person, place, and time.      Cranial Nerves: No cranial nerve deficit.      Coordination: Coordination normal.      Deep Tendon Reflexes: Reflexes are normal and symmetric.   Psychiatric:          Mood and Affect: Mood and affect normal.         Cognition and Memory: Memory normal.         Judgment: Judgment normal.             Intake and Output:   Current Shift:  No intake/output data recorded.   Last three shifts:  No intake/output data recorded.      Lab/Data Reviewed:     Recent Results (from the past 24 hour(s))     CBC WITH AUTOMATED DIFF          Collection Time: 06/01/19 11:50 AM         Result  Value  Ref Range            WBC  11.2 (H)  4.0 - 11.0 1000/mm3       RBC  4.86  3.60 - 5.20 M/uL       HGB  14.6  13.0 - 17.2 gm/dl       HCT  13.0  86.5 - 50.0 %       MCV  89.9  80.0 - 98.0 fL       MCH  30.0  25.4 - 34.6 pg       MCHC  33.4  30.0 - 36.0 gm/dl       PLATELET  784  696 - 450 1000/mm3       MPV  10.7 (H)  6.0 - 10.0 fL       RDW-SD  43.7  36.4 - 46.3         NRBC  0  0 - 0         IMMATURE GRANULOCYTES  0.4  0.0 - 3.0 %       NEUTROPHILS  61.7  34 - 64 %       LYMPHOCYTES  27.5 (L)  28 - 48 %       MONOCYTES  8.6  1 - 13 %       EOSINOPHILS  1.4  0 - 5 %       BASOPHILS  0.4  0 - 3 %       METABOLIC PANEL, BASIC          Collection Time: 06/01/19 11:50 AM         Result  Value  Ref Range            Sodium  144  136 - 145 mEq/L       Potassium  4.6   3.5 - 5.1 mEq/L       Chloride  108 (H)  98 - 107 mEq/L       CO2  30  21 - 32 mEq/L       Glucose  119 (H)  74 - 106 mg/dl       BUN  19  7 - 25 mg/dl       Creatinine  1.2  0.6 - 1.3 mg/dl       GFR est AA  16.1          GFR est non-AA  48          Calcium  10.1  8.5 - 10.1 mg/dl       Anion gap  6  5 - 15 mmol/L       POC URINE MACROSCOPIC          Collection Time: 06/01/19 11:56 AM         Result  Value  Ref Range            Glucose  Negative  NEGATIVE,Negative mg/dl       Bilirubin  Small (A)  NEGATIVE,Negative         Ketone  Negative  NEGATIVE,Negative mg/dl       Specific gravity  1.025  1.005 - 1.030         Blood  Trace-intact (A)  NEGATIVE,Negative         pH (UA)  5.0  5 - 9         Protein  30 (A)  NEGATIVE,Negative mg/dl       Urobilinogen  0.2  0.0 - 1.0 EU/dl       Nitrites  Negative  NEGATIVE,Negative         Leukocyte Esterase  Small (A)  NEGATIVE,Negative         Color  Dark yellow          Appearance  Cloudy          POC URINE MICROSCOPIC          Collection Time: 06/01/19 11:56 AM         Result  Value  Ref Range            Epithelial cells, squamous  1-4  /LPF       WBC  5-9  /HPF       RBC  1-4  /HPF       Mucus  PRESENT          Bacteria  1+  /HPF       Yeast, urine  OCCASIONAL  /HPF            Hyaline cast  OCCASIONAL  /LPF        CT Results (most recent):     Results from Hospital Encounter encounter on 06/01/19     CT ABD PELV W CONT           Narrative  EXAM: CT ABD PELV W CONT      INDICATION: LLQ abd pain          TECHNIQUE: Axial CT scan of the abdomen and pelvis with   IV contrast       including coronal and sagittal reconstructions.     DICOM format image data is available to non-affiliated external healthcare   facilities or entities on a secure, media  free, reciprocally searchable basis   with patient authorization for 12 months following the date of the study.         COMPARISON: 05/07/2019      FINDINGS:   Lower thorax: Bibasilar bullae.      ABDOMEN:   Liver: Unremarkable. No  mass.   Gallbladder and bile ducts: Unremarkable. No calcified stones. No ductal   dilatation.   Pancreas: Unremarkable. No ductal dilatation. No mass.   Spleen: Multiple unchanged splenic lesions.   Adrenals: Unremarkable. No mass.   Kidneys and ureters: 2 mm nonobstructing right kidney stone.   Appendix: No findings to suggest appendicitis.   Stomach and bowel: Stomach unremarkable. No obstruction. No mucosal thickening.   No inflammatory changes. Multiple sigmoid diverticula without diverticulitis.   Peritoneum: Unremarkable. No significant fluid collection. No free air.   Lymph nodes: Unremarkable. No enlarged lymph nodes.   Vasculature: Unremarkable. No aortic aneurysm.   Bones: Total right hip replacement.   Abdominal wall: Unremarkable. No evidence of a hernia.      PELVIS:   Bladder: Unremarkable. No mass.   Reproductive: Hysterectomy.              Impression  IMPRESSION:      1. Multiple sigmoid diverticula without diverticulitis.   2. 2 mm nonobstructing right kidney stone.   3. Total right hip replacement.   4. Hysterectomy.           XR Results (most recent):     Results from Hospital Encounter encounter on 06/01/19     XR CHEST SNGL V           Narrative  INDICATION:   sob         EXAMINATION:   XR CHEST SNGL V      COMPARISON:   05/07/2019      FINDINGS:   The study shows a normal sized heart.. 2 cm left lower lobe lung nodule.   Unchanged oval opacity superior to the nodule measuring approximately 3.6 cm.   This may represent consolidation. There is also lingular infiltrate.              Impression  IMPRESSION:   1. Known 2 cm left lower lobe nodule.   2. Associated 3.6 cm opacity superiorly possibly lung consolidation.   3. Lingular infiltrate.                 Richarda Overlie, DO      June 01, 2019   2:28 PM      Dragon medical dictation software was used for portions of this report.   Unintended voice transcription errors may have occurred.

## 2019-06-01 NOTE — Progress Notes (Signed)
 TRANSFER - OUT REPORT:    Verbal report given to Beatrice(name) on [name] [name]  being transferred to [facility](unit) for routine progression of care       Report consisted of patient's Situation, Background, Assessment and   Recommendations(SBAR).     Information from the following report(s) SBAR was reviewed with the receiving nurse.    Lines:   Peripheral IV 06/01/19 Right Forearm (Active)   Site Assessment Clean, dry, & intact 06/01/19 1155   Phlebitis Assessment 0 06/01/19 1155   Infiltration Assessment 0 06/01/19 1155   Dressing Status Clean, dry, & intact 06/01/19 1155        Opportunity for questions and clarification was provided.      Patient transported with:   Monitor  O2 @ 2 liters  Registered Nurse

## 2019-06-01 NOTE — Progress Notes (Signed)
Pt crying in pain, will notify pvr  abx infusing at this time and 2nd breathing tx.

## 2019-06-01 NOTE — ED Notes (Signed)
Patient complains of left groin pain that stated yesterday, when asked patient states it is her hip, but when asked where the pain is she points at her groin.

## 2019-06-02 LAB — CBC
Hematocrit: 40 % (ref 37.0–50.0)
Hemoglobin: 13.6 gm/dl (ref 13.0–17.2)
MCH: 30.4 pg (ref 25.4–34.6)
MCHC: 34 gm/dl (ref 30.0–36.0)
MCV: 89.5 fL (ref 80.0–98.0)
MPV: 11.4 fL — ABNORMAL HIGH (ref 6.0–10.0)
Platelets: 186 10*3/uL (ref 140–450)
RBC: 4.47 M/uL (ref 3.60–5.20)
RDW-SD: 42.9 (ref 36.4–46.3)
WBC: 13.1 10*3/uL — ABNORMAL HIGH (ref 4.0–11.0)

## 2019-06-02 LAB — POCT GLUCOSE
POC Glucose: 206 mg/dL — ABNORMAL HIGH (ref 65–105)
POC Glucose: 439 mg/dL (ref 65–105)

## 2019-06-02 LAB — COMPREHENSIVE METABOLIC PANEL
ALT: 16 U/L (ref 12–78)
AST: 9 U/L — ABNORMAL LOW (ref 15–37)
Albumin: 3.6 gm/dl (ref 3.4–5.0)
Alkaline Phosphatase: 91 U/L (ref 45–117)
Anion Gap: 11 mmol/L (ref 5–15)
BUN: 22 mg/dl (ref 7–25)
CO2: 24 mEq/L (ref 21–32)
Calcium: 9.2 mg/dl (ref 8.5–10.1)
Chloride: 100 mEq/L (ref 98–107)
Creatinine: 1.2 mg/dl (ref 0.6–1.3)
EGFR IF NonAfrican American: 48
GFR African American: 58
Glucose: 334 mg/dl — ABNORMAL HIGH (ref 74–106)
Potassium: 5.2 mEq/L — ABNORMAL HIGH (ref 3.5–5.1)
Sodium: 134 mEq/L — ABNORMAL LOW (ref 136–145)
Total Bilirubin: 0.3 mg/dl (ref 0.2–1.0)
Total Protein: 7.9 gm/dl (ref 6.4–8.2)

## 2019-06-02 LAB — TSH 3RD GENERATION
TSH: 0.17 u[IU]/mL — ABNORMAL LOW (ref 0.358–3.740)
TSH: 0.17 u[IU]/mL — ABNORMAL LOW (ref 0.358–3.740)

## 2019-06-02 LAB — METABOLIC PANEL, COMPREHENSIVE
ALT (SGPT): 16 U/L (ref 12–78)
AST (SGOT): 9 U/L — ABNORMAL LOW (ref 15–37)
Albumin: 3.6 gm/dl (ref 3.4–5.0)
Alk. phosphatase: 91 U/L (ref 45–117)
Anion gap: 11 mmol/L (ref 5–15)
BUN: 22 mg/dl (ref 7–25)
Bilirubin, total: 0.3 mg/dl (ref 0.2–1.0)
CO2: 24 mEq/L (ref 21–32)
Calcium: 9.2 mg/dl (ref 8.5–10.1)
Chloride: 100 mEq/L (ref 98–107)
Creatinine: 1.2 mg/dl (ref 0.6–1.3)
GFR est AA: 58
GFR est non-AA: 48
Glucose: 334 mg/dl — ABNORMAL HIGH (ref 74–106)
Potassium: 5.2 mEq/L — ABNORMAL HIGH (ref 3.5–5.1)
Protein, total: 7.9 gm/dl (ref 6.4–8.2)
Sodium: 134 mEq/L — ABNORMAL LOW (ref 136–145)

## 2019-06-02 LAB — CBC W/O DIFF
HCT: 40 % (ref 37.0–50.0)
HGB: 13.6 gm/dl (ref 13.0–17.2)
MCH: 30.4 pg (ref 25.4–34.6)
MCHC: 34 gm/dl (ref 30.0–36.0)
MCV: 89.5 fL (ref 80.0–98.0)
MPV: 11.4 fL — ABNORMAL HIGH (ref 6.0–10.0)
PLATELET: 186 10*3/uL (ref 140–450)
RBC: 4.47 M/uL (ref 3.60–5.20)
RDW-SD: 42.9 (ref 36.4–46.3)
WBC: 13.1 10*3/uL — ABNORMAL HIGH (ref 4.0–11.0)

## 2019-06-02 LAB — GLUCOSE, POC
Glucose (POC): 206 mg/dL — ABNORMAL HIGH (ref 65–105)
Glucose (POC): 439 mg/dL — CR (ref 65–105)

## 2019-06-02 MED ORDER — LIDOCAINE 5 % (700 MG/PATCH) ADHESIVE PATCH
5 % | CUTANEOUS | Status: DC
Start: 2019-06-02 — End: 2019-06-05

## 2019-06-02 MED ORDER — DEXTROSE 50% IN WATER (D50W) IV
INTRAVENOUS | Status: DC | PRN
Start: 2019-06-02 — End: 2019-06-05

## 2019-06-02 MED ORDER — INSULIN LISPRO 100 UNIT/ML INJECTION
100 unit/mL | Freq: Four times a day (QID) | SUBCUTANEOUS | Status: DC
Start: 2019-06-02 — End: 2019-06-05
  Administered 2019-06-02 – 2019-06-05 (×11): via SUBCUTANEOUS

## 2019-06-02 MED ORDER — GLUCAGON 1 MG INJECTION
1 mg | INTRAMUSCULAR | Status: DC | PRN
Start: 2019-06-02 — End: 2019-06-05

## 2019-06-02 MED ORDER — IBUPROFEN 400 MG TAB
400 mg | Freq: Three times a day (TID) | ORAL | Status: DC
Start: 2019-06-02 — End: 2019-06-04
  Administered 2019-06-02 – 2019-06-04 (×6): via ORAL

## 2019-06-02 MED ORDER — FLUTICASONE 100 MCG-VILANTEROL 25 MCG/DOSE BREATH ACTIVATED INHALER
100-25 mcg/dose | Freq: Every day | RESPIRATORY_TRACT | Status: DC
Start: 2019-06-02 — End: 2019-06-05
  Administered 2019-06-02 – 2019-06-05 (×3): via RESPIRATORY_TRACT

## 2019-06-02 MED ORDER — INSULIN LISPRO 100 UNIT/ML INJECTION
100 unit/mL | Freq: Four times a day (QID) | SUBCUTANEOUS | Status: DC
Start: 2019-06-02 — End: 2019-06-02

## 2019-06-02 MED ORDER — INSULIN LISPRO 100 UNIT/ML INJECTION
100 unit/mL | SUBCUTANEOUS | Status: DC | PRN
Start: 2019-06-02 — End: 2019-06-02

## 2019-06-02 MED ORDER — FAMOTIDINE 20 MG TAB
20 mg | Freq: Two times a day (BID) | ORAL | Status: DC
Start: 2019-06-02 — End: 2019-06-05
  Administered 2019-06-02 – 2019-06-05 (×7): via ORAL

## 2019-06-02 MED ORDER — INSULIN GLARGINE 100 UNIT/ML INJECTION
100 unit/mL | Freq: Every evening | SUBCUTANEOUS | Status: DC
Start: 2019-06-02 — End: 2019-06-05
  Administered 2019-06-03 – 2019-06-05 (×3): via SUBCUTANEOUS

## 2019-06-02 MED FILL — IBUPROFEN 400 MG TAB: 400 mg | ORAL | Qty: 2

## 2019-06-02 MED FILL — DILTIAZEM ER 240 MG 24 HR CAP: 240 mg | ORAL | Qty: 1

## 2019-06-02 MED FILL — MONTELUKAST 10 MG TAB: 10 mg | ORAL | Qty: 1

## 2019-06-02 MED FILL — SODIUM CHLORIDE 0.9 % IJ SYRG: INTRAMUSCULAR | Qty: 10

## 2019-06-02 MED FILL — HEPARIN (PORCINE) 5,000 UNIT/ML IJ SOLN: 5000 unit/mL | INTRAMUSCULAR | Qty: 1

## 2019-06-02 MED FILL — ROSUVASTATIN 5 MG TAB: 5 mg | ORAL | Qty: 1

## 2019-06-02 MED FILL — METHOCARBAMOL 500 MG TAB: 500 mg | ORAL | Qty: 1

## 2019-06-02 MED FILL — IPRATROPIUM-ALBUTEROL 2.5 MG-0.5 MG/3 ML NEB SOLUTION: 2.5 mg-0.5 mg/3 ml | RESPIRATORY_TRACT | Qty: 3

## 2019-06-02 MED FILL — TIOTROPIUM BROMIDE 2.5 MCG/ACTUATION MIST FOR INHALATION: 2.5 mcg/actuation | RESPIRATORY_TRACT | Qty: 4

## 2019-06-02 MED FILL — FAMOTIDINE 20 MG TAB: 20 mg | ORAL | Qty: 1

## 2019-06-02 MED FILL — TYLENOL 325 MG TABLET: 325 mg | ORAL | Qty: 2

## 2019-06-02 MED FILL — POLYETHYLENE GLYCOL 3350 17 GRAM (100 %) ORAL POWDER PACKET: 17 gram | ORAL | Qty: 1

## 2019-06-02 MED FILL — LOSARTAN 50 MG TAB: 50 mg | ORAL | Qty: 1

## 2019-06-02 MED FILL — AZITHROMYCIN 500 MG IV SOLUTION: 500 mg | INTRAVENOUS | Qty: 5

## 2019-06-02 MED FILL — CEFTRIAXONE 1 GRAM SOLUTION FOR INJECTION: 1 gram | INTRAMUSCULAR | Qty: 1

## 2019-06-02 MED FILL — SOLU-MEDROL (PF) 40 MG/ML SOLUTION FOR INJECTION: 40 mg/mL | INTRAMUSCULAR | Qty: 1

## 2019-06-02 MED FILL — INSULIN LISPRO 100 UNIT/ML INJECTION: 100 unit/mL | SUBCUTANEOUS | Qty: 1

## 2019-06-02 MED FILL — SEROQUEL XR 50 MG TABLET,EXTENDED RELEASE: 50 mg | ORAL | Qty: 3

## 2019-06-02 MED FILL — GABAPENTIN 300 MG CAP: 300 mg | ORAL | Qty: 1

## 2019-06-02 MED FILL — LIDOCAINE 5 % (700 MG/PATCH) ADHESIVE PATCH: 5 % | CUTANEOUS | Qty: 1

## 2019-06-02 MED FILL — MUCINEX 600 MG TABLET, EXTENDED RELEASE: 600 mg | ORAL | Qty: 1

## 2019-06-02 MED FILL — BREO ELLIPTA 100 MCG-25 MCG/DOSE POWDER FOR INHALATION: 100-25 mcg/dose | RESPIRATORY_TRACT | Qty: 28

## 2019-06-02 MED FILL — CLONIDINE 0.1 MG TAB: 0.1 mg | ORAL | Qty: 1

## 2019-06-02 MED FILL — SPIRONOLACTONE 25 MG TAB: 25 mg | ORAL | Qty: 4

## 2019-06-02 MED FILL — MAGNESIUM OXIDE 400 MG TAB: 400 mg | ORAL | Qty: 1

## 2019-06-02 NOTE — Progress Notes (Signed)
Problem: Falls - Risk of  Goal: *Absence of Falls  Description: Document Schmid Fall Risk and appropriate interventions in the flowsheet.  Outcome: Progressing Towards Goal  Note: Fall Risk Interventions:  Mobility Interventions: Assess mobility with egress test, Bed/chair exit alarm, Communicate number of staff needed for ambulation/transfer         Medication Interventions: Assess postural VS orthostatic hypotension, Bed/chair exit alarm, Evaluate medications/consider consulting pharmacy, Patient to call before getting OOB

## 2019-06-02 NOTE — Progress Notes (Signed)
Problem: Falls - Risk of  Goal: *Absence of Falls  Description: Document Schmid Fall Risk and appropriate interventions in the flowsheet.  Outcome: Progressing Towards Goal  Note: Fall Risk Interventions:            Medication Interventions: Assess postural VS orthostatic hypotension, Bed/chair exit alarm, Patient to call before getting OOB, Teach patient to arise slowly          Problem: Patient Education: Go to Patient Education Activity  Goal: Patient/Family Education  Outcome: Progressing Towards Goal

## 2019-06-02 NOTE — Progress Notes (Signed)
PAGER ID: [phone]   MESSAGE: [name], [name] [id number]. Hypoxia, COPD exacerbation. Complaining of left hip pain. Given Tylenol with no relief. Attend. Dr. Riles. Kindly Advise. Beatrice #[phone]

## 2019-06-02 NOTE — Progress Notes (Signed)
Patient noted to be wheezing, RT called who came and gave a breathing treatment

## 2019-06-02 NOTE — Progress Notes (Signed)
Hospitalist Progress Note    Patient: [name] [name] MRN: [id number]  CSN: [id number]    Date of Birth: 1950-12-05  Age: 68 y.o.  Sex: female    DOA: 06/01/2019 LOS:  LOS: 1 day          Chief Complaint:   Chief Complaint   Patient presents with   ??? Hip Pain   ??? Groin Pain       Assessment/Plan     COPD with AE  Lingular infiltrate, consistent with pneumonia.  Continue treatment  Acute hypoxic RF 88% RA  Left lateral hip pain.  Orthopedic consult.  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 Severe hypertension I10   ??? 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 PTSD (post-traumatic stress disorder) F43.10   ??? Severe headache R51.9   ??? Accelerated hypertension I10   ??? Acute colitis K52.9   ??? Acute hyperglycemia R73.9   ??? Type 2 diabetes mellitus with hyperglycemia, without long-term current use of insulin (HCC) E11.65   ??? 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 Lumbar and sacral osteoarthritis M47.817   ??? Chronic pain syndrome G89.4   ??? Essential hypertension I10   ??? Sacroiliitis (HCC) M46.1   ??? 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 Lactic acidosis E87.2   ??? Leukocytosis D72.829   ??? Sepsis (HCC) A41.9   ??? Asthma exacerbation J45.901   ??? Asthma with acute exacerbation J45.901   ??? Gastritis K29.70   ??? Nausea & vomiting R11.2   ??? 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 Cannabinoid hyperemesis syndrome R11.2, F12.90   ??? 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 Chest pain R07.9   ??? Dyspnea R06.00   ??? Dehydration E86.0   ??? COPD with acute exacerbation (HCC) J44.1   ??? Acute-on-chronic kidney injury (HCC) N17.9, N18.9   ??? Hyperkalemia E87.5   ??? Obtunded R40.1    ??? Acute renal failure (ARF) (HCC) N17.9   ??? Septic shock (HCC) A41.9, R65.21   ??? Metabolic encephalopathy G93.41   ??? SIRS (systemic inflammatory response syndrome) (HCC) R65.10   ??? Abdominal pain R10.9   ??? Headache R51.9   ??? Respiratory failure with hypoxia (HCC) J96.91   ??? COPD exacerbation (HCC) J44.1   ??? Acute hypoxemic respiratory failure (HCC) J96.01   ??? UTI (urinary tract infection) N39.0   ??? Hypoxia R09.02       Subjective: 68 year old female patient who is admitted with a angular infiltrate consistent with pneumonia he was noted to have acute hypoxemia with an O2 sat of 88% on room air.  The patient presented with left hip pain that has been quite severe and in her mind is the reason that she came to the hospital.  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  CT scan of the abdomen and pelvis not remarkable for end-stage arthritis in the left hip and on exam the pain pattern seems more be consistent with trochanteric bursitis.  I have consulted [facility] to evaluate this problem and treat.  For the time being we will attempt some treatment the combination of nonsteroidal and Lidoderm patch.  The patient does take insulin at home and we have started a Glucomander screening that if the blood sugar goes over 180 the Glucomander protocol will start on the sensitive scale.        Review of systems:   []  Unable to obtain  ROS due to  [] mental status change  [] sedated   [] intubated    Constitutional: No fever, chills, or weight loss  Eyes: No visual symptoms.  ENT: No sore throat, runny nose or ear pain.  Respiratory: No cough, dyspnea or wheezing.  Cardiovascular: No chest pain, pressure, palpitations, tightness or heaviness.  Gastrointestinal: No vomiting, diarrhea or abdominal pain.  Genitourinary: No dysuria, frequency, or urgency.  Musculoskeletal: No joint pain or swelling.  Integumentary: No rashes.   Neurological: No headaches, sensory or motor symptoms.    Vital signs/Intake and Output:  Visit Vitals  BP 124/87 (BP 1 Location: Right arm, BP Patient Position: Supine)   Pulse 96   Temp 97.7 ??F (36.5 ??C)   Resp 16   Ht 5\' 7"  (1.702 m)   Wt 98.3 kg (216 lb 11.4 oz)   SpO2 97%   BMI 33.94 kg/m??     Current Shift:  12/20 0701 - 12/20 1900  In: 116 [P.O.:116]  Out: -   Last three shifts:  12/18 1901 - 12/20 0700  In: 50 [I.V.:50]  Out: 800 [Urine:800]    Exam:    General:  Alert, cooperative, severe distress, appears younger than stated age.   Head:  Normocephalic, without obvious abnormality, atraumatic.   Eyes:  Conjunctivae/corneas clear. PERRL, EOMs intact.        Nose: Nares normal. Septum midline. Mucosa normal. No drainage or sinus tenderness.       Neck: Supple, symmetrical, trachea midline, no adenopathy, thyroid: no enlargement/tenderness/nodules, no carotid bruit and no JVD.   Back:   Symmetric, no curvature. ROM normal. No CVA tenderness.   Lungs:   Clear to auscultation bilaterally.   Chest wall:  No tenderness or deformity.   Heart:   Rapid rate and rhythm, S1, S2 normal, no murmur, click, rub or gallop.   Abdomen:   Soft, non-tender. Bowel sounds normal. No masses,  No organomegaly.           Extremities: Extremities normal, atraumatic, no cyanosis or edema.  Tender at the left lateral hip and extending proximally along the lateral thigh about a third of the distance from the hip to the knee.       Skin: Skin color, texture, turgor normal. No rashes or lesions       Neurologic: CNII-XII intact. Normal strength, sensation throughout.             Labs:    Sodium   Date Value Ref Range Status   06/01/2019 144 136 - 145 mEq/L Final     Potassium   Date Value Ref Range Status   06/01/2019 4.6 3.5 - 5.1 mEq/L Final     BUN   Date Value Ref Range Status   06/01/2019 19 7 - 25 mg/dl Final     Calcium   Date Value Ref Range Status   06/01/2019 10.1 8.5 - 10.1 mg/dl Final     Chloride    Date Value Ref Range Status   06/01/2019 108 (H) 98 - 107 mEq/L Final     CO2   Date Value Ref Range Status  06/01/2019 30 21 - 32 mEq/L Final     Glucose   Date Value Ref Range Status   06/01/2019 119 (H) 74 - 106 mg/dl Final     HGB   Date Value Ref Range Status   06/01/2019 14.6 13.0 - 17.2 gm/dl Final     HCT   Date Value Ref Range Status   06/01/2019 43.7 37.0 - 50.0 % Final     WBC   Date Value Ref Range Status   06/01/2019 11.2 (H) 4.0 - 11.0 1000/mm3 Final     PLATELET   Date Value Ref Range Status   06/01/2019 233 140 - 450 1000/mm3 Final         Procedures/imaging: see electronic medical records for all procedures/Xrays and details which were not copied into this note but were reviewed prior to creation of Plan      Corene CorneaNathan L Tanashia Ciesla, MD  June 02, 2019
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
## 2019-06-02 NOTE — Progress Notes (Signed)
PAGER ID: [phone]   MESSAGE: [name] [name], [name] [name] [phone]. Hypoxia, COPD exacerbation. Complaining of left hip pain. Given Tylenol with no relief. Attend. Dr. Lanelle Bal. Kindly Advise. Diamond Nickel [phone]

## 2019-06-02 NOTE — Progress Notes (Signed)
Patient noted to be wheezing, RT called who came and gave a breathing treatment

## 2019-06-02 NOTE — Progress Notes (Signed)
Hospitalist Progress Note    Patient: [name] [name] MRN: [id number]  CSN: [id number]    Date of Birth: April 03, 1951  Age: 68 y.o.  Sex: female    DOA: 06/01/2019 LOS:  LOS: 1 day          Chief Complaint:   Chief Complaint   Patient presents with   ??? Hip Pain   ??? Groin Pain       Assessment/Plan     COPD with AE  Lingular infiltrate, consistent with pneumonia.  Continue treatment  Acute hypoxic RF 88% RA  Left lateral hip pain.  Orthopedic consult.  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 Severe hypertension I10   ??? 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 PTSD (post-traumatic stress disorder) F43.10   ??? Severe headache R51.9   ??? Accelerated hypertension I10   ??? Acute colitis K52.9   ??? Acute hyperglycemia R73.9   ??? Type 2 diabetes mellitus with hyperglycemia, without long-term current use of insulin (HCC) E11.65   ??? 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 Lumbar and sacral osteoarthritis M47.817   ??? Chronic pain syndrome G89.4   ??? Essential hypertension I10   ??? Sacroiliitis (HCC) M46.1   ??? 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 Lactic acidosis E87.2   ??? Leukocytosis D72.829   ??? Sepsis (HCC) A41.9   ??? Asthma exacerbation J45.901   ??? Asthma with acute exacerbation J45.901   ??? Gastritis K29.70   ??? Nausea & vomiting R11.2   ??? 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 Cannabinoid hyperemesis syndrome R11.2, F12.90   ??? 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 Chest pain R07.9   ??? Dyspnea R06.00   ??? Dehydration E86.0   ??? COPD with acute exacerbation (HCC) J44.1   ??? Acute-on-chronic kidney injury (HCC) N17.9, N18.9   ??? Hyperkalemia E87.5   ??? Obtunded R40.1   ???  Acute renal failure (ARF) (HCC) N17.9   ??? Septic shock (HCC) A41.9, R65.21   ??? Metabolic encephalopathy G93.41   ??? SIRS (systemic inflammatory response syndrome) (HCC) R65.10   ??? Abdominal pain R10.9   ??? Headache R51.9   ??? Respiratory failure with hypoxia (HCC) J96.91   ??? COPD exacerbation (HCC) J44.1   ??? Acute hypoxemic respiratory failure (HCC) J96.01   ??? UTI (urinary tract infection) N39.0   ??? Hypoxia R09.02       Subjective: 68 year old female patient who is admitted with a angular infiltrate consistent with pneumonia he was noted to have acute hypoxemia with an O2 sat of 88% on room air.  The patient presented with left hip pain that has been quite severe and in her mind is the reason that she came to the hospital.  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  CT scan of the abdomen and pelvis not remarkable for end-stage arthritis in the left hip and on exam the pain pattern seems more be consistent with trochanteric bursitis.  I have consulted [facility] to evaluate this problem and treat.  For the time being we will attempt some treatment the combination of nonsteroidal and Lidoderm patch.  The patient does take insulin at home and we have started a Glucomander screening that if the blood sugar goes over 180 the Glucomander protocol will start on the sensitive scale.        Review of systems:   []  Unable to obtain  ROS due to  [] mental status change  [] sedated   [] intubated    Constitutional: No fever, chills, or weight loss  Eyes: No visual symptoms.  ENT: No sore throat, runny nose or ear pain.  Respiratory: No cough, dyspnea or wheezing.  Cardiovascular: No chest pain, pressure, palpitations, tightness or heaviness.  Gastrointestinal: No vomiting, diarrhea or abdominal pain.  Genitourinary: No dysuria, frequency, or urgency.  Musculoskeletal: No joint pain or swelling.  Integumentary: No rashes.  Neurological: No headaches,  sensory or motor symptoms.    Vital signs/Intake and Output:  Visit Vitals  BP 124/87 (BP 1 Location: Right arm, BP Patient Position: Supine)   Pulse 96   Temp 97.7 ??F (36.5 ??C)   Resp 16   Ht 5\' 7"  (1.702 m)   Wt 98.3 kg (216 lb 11.4 oz)   SpO2 97%   BMI 33.94 kg/m??     Current Shift:  12/20 0701 - 12/20 1900  In: 116 [P.O.:116]  Out: -   Last three shifts:  12/18 1901 - 12/20 0700  In: 50 [I.V.:50]  Out: 800 [Urine:800]    Exam:    General:  Alert, cooperative, severe distress, appears younger than stated age.   Head:  Normocephalic, without obvious abnormality, atraumatic.   Eyes:  Conjunctivae/corneas clear. PERRL, EOMs intact.        Nose: Nares normal. Septum midline. Mucosa normal. No drainage or sinus tenderness.       Neck: Supple, symmetrical, trachea midline, no adenopathy, thyroid: no enlargement/tenderness/nodules, no carotid bruit and no JVD.   Back:   Symmetric, no curvature. ROM normal. No CVA tenderness.   Lungs:   Clear to auscultation bilaterally.   Chest wall:  No tenderness or deformity.   Heart:   Rapid rate and rhythm, S1, S2 normal, no murmur, click, rub or gallop.   Abdomen:   Soft, non-tender. Bowel sounds normal. No masses,  No organomegaly.           Extremities: Extremities normal, atraumatic, no cyanosis or edema.  Tender at the left lateral hip and extending proximally along the lateral thigh about a third of the distance from the hip to the knee.       Skin: Skin color, texture, turgor normal. No rashes or lesions       Neurologic: CNII-XII intact. Normal strength, sensation throughout.             Labs:    Sodium   Date Value Ref Range Status   06/01/2019 144 136 - 145 mEq/L Final     Potassium   Date Value Ref Range Status   06/01/2019 4.6 3.5 - 5.1 mEq/L Final     BUN   Date Value Ref Range Status   06/01/2019 19 7 - 25 mg/dl Final     Calcium   Date Value Ref Range Status   06/01/2019 10.1 8.5 - 10.1 mg/dl Final     Chloride   Date Value Ref Range Status   06/01/2019 108 (H) 98 -  107 mEq/L Final     CO2   Date Value Ref Range Status  06/01/2019 30 21 - 32 mEq/L Final     Glucose   Date Value Ref Range Status   06/01/2019 119 (H) 74 - 106 mg/dl Final     HGB   Date Value Ref Range Status   06/01/2019 14.6 13.0 - 17.2 gm/dl Final     HCT   Date Value Ref Range Status   06/01/2019 43.7 37.0 - 50.0 % Final     WBC   Date Value Ref Range Status   06/01/2019 11.2 (H) 4.0 - 11.0 1000/mm3 Final     PLATELET   Date Value Ref Range Status   06/01/2019 233 140 - 450 1000/mm3 Final         Procedures/imaging: see electronic medical records for all procedures/Xrays and details which were not copied into this note but were reviewed prior to creation of Plan      [name] CorneaNathan L Maleeyah Mccaughey, MD  June 02, 2019

## 2019-06-02 NOTE — Progress Notes (Signed)
Problem: Falls - Risk of  Goal: *Absence of Falls  Description: Document Schmid Fall Risk and appropriate interventions in the flowsheet.  Outcome: Progressing Towards Goal  Note: Fall Risk Interventions:            Medication Interventions: Assess postural VS orthostatic hypotension,Bed/chair exit alarm,Patient to call before getting OOB,Teach patient to arise slowly                   Problem: Patient Education: Go to Patient Education Activity  Goal: Patient/Family Education  Outcome: Progressing Towards Goal

## 2019-06-02 NOTE — Progress Notes (Signed)
Problem: Falls - Risk of  Goal: *Absence of Falls  Description: Document [name] [name] Fall Risk and appropriate interventions in the flowsheet.  Outcome: Progressing Towards Goal  Note: Fall Risk Interventions:  Mobility Interventions: Assess mobility with egress test, Bed/chair exit alarm, Communicate number of staff needed for ambulation/transfer         Medication Interventions: Assess postural VS orthostatic hypotension, Bed/chair exit alarm, Evaluate medications/consider consulting pharmacy, Patient to call before getting OOB

## 2019-06-03 LAB — BASIC METABOLIC PANEL
Anion Gap: 8 mmol/L (ref 5–15)
BUN: 28 mg/dl — ABNORMAL HIGH (ref 7–25)
CO2: 27 mEq/L (ref 21–32)
Calcium: 8.9 mg/dl (ref 8.5–10.1)
Chloride: 101 mEq/L (ref 98–107)
Creatinine: 1.2 mg/dl (ref 0.6–1.3)
EGFR IF NonAfrican American: 48
GFR African American: 58
Glucose: 404 mg/dl (ref 74–106)
Potassium: 5 mEq/L (ref 3.5–5.1)
Sodium: 135 mEq/L — ABNORMAL LOW (ref 136–145)

## 2019-06-03 LAB — POCT GLUCOSE
POC Glucose: 352 mg/dL — ABNORMAL HIGH (ref 65–105)
POC Glucose: 308 mg/dL — ABNORMAL HIGH (ref 65–105)
POC Glucose: 347 mg/dL — ABNORMAL HIGH (ref 65–105)
POC Glucose: 375 mg/dL — ABNORMAL HIGH (ref 65–105)

## 2019-06-03 LAB — CULTURE, URINE
CULTURE RESULT: >100000 — AB
Culture result: >100000 — AB

## 2019-06-03 LAB — METABOLIC PANEL, BASIC
Anion gap: 8 mmol/L (ref 5–15)
BUN: 28 mg/dl — ABNORMAL HIGH (ref 7–25)
CO2: 27 mEq/L (ref 21–32)
Calcium: 8.9 mg/dl (ref 8.5–10.1)
Chloride: 101 mEq/L (ref 98–107)
Creatinine: 1.2 mg/dl (ref 0.6–1.3)
GFR est AA: 58
GFR est non-AA: 48
Glucose: 404 mg/dl — CR (ref 74–106)
Potassium: 5 mEq/L (ref 3.5–5.1)
Sodium: 135 mEq/L — ABNORMAL LOW (ref 136–145)

## 2019-06-03 LAB — GLUCOSE, POC
Glucose (POC): 352 mg/dL — ABNORMAL HIGH (ref 65–105)
Glucose (POC): 308 mg/dL — ABNORMAL HIGH (ref 65–105)
Glucose (POC): 347 mg/dL — ABNORMAL HIGH (ref 65–105)
Glucose (POC): 375 mg/dL — ABNORMAL HIGH (ref 65–105)

## 2019-06-03 MED ORDER — METHYLPREDNISOLONE (PF) 40 MG/ML IJ SOLR
40 mg/mL | Freq: Two times a day (BID) | INTRAMUSCULAR | Status: DC
Start: 2019-06-03 — End: 2019-06-04
  Administered 2019-06-04: 02:00:00 via INTRAVENOUS

## 2019-06-03 MED FILL — SPIRONOLACTONE 25 MG TAB: 25 mg | ORAL | Qty: 4

## 2019-06-03 MED FILL — AZITHROMYCIN 500 MG IV SOLUTION: 500 mg | INTRAVENOUS | Qty: 5

## 2019-06-03 MED FILL — HEPARIN (PORCINE) 5,000 UNIT/ML IJ SOLN: 5000 unit/mL | INTRAMUSCULAR | Qty: 1

## 2019-06-03 MED FILL — TRAZODONE 50 MG TAB: 50 mg | ORAL | Qty: 1

## 2019-06-03 MED FILL — SODIUM CHLORIDE 0.9 % IJ SYRG: INTRAMUSCULAR | Qty: 10

## 2019-06-03 MED FILL — MUCINEX 600 MG TABLET, EXTENDED RELEASE: 600 mg | ORAL | Qty: 1

## 2019-06-03 MED FILL — MONTELUKAST 10 MG TAB: 10 mg | ORAL | Qty: 1

## 2019-06-03 MED FILL — BUTALBITAL-ACETAMINOPHEN-CAFFEINE 50 MG-325 MG-40 MG TAB: 50-325-40 mg | ORAL | Qty: 1

## 2019-06-03 MED FILL — LOSARTAN 50 MG TAB: 50 mg | ORAL | Qty: 1

## 2019-06-03 MED FILL — IPRATROPIUM-ALBUTEROL 2.5 MG-0.5 MG/3 ML NEB SOLUTION: 2.5 mg-0.5 mg/3 ml | RESPIRATORY_TRACT | Qty: 3

## 2019-06-03 MED FILL — CLONIDINE 0.1 MG TAB: 0.1 mg | ORAL | Qty: 1

## 2019-06-03 MED FILL — METHOCARBAMOL 500 MG TAB: 500 mg | ORAL | Qty: 1

## 2019-06-03 MED FILL — FAMOTIDINE 20 MG TAB: 20 mg | ORAL | Qty: 1

## 2019-06-03 MED FILL — SOLU-MEDROL (PF) 40 MG/ML SOLUTION FOR INJECTION: 40 mg/mL | INTRAMUSCULAR | Qty: 1

## 2019-06-03 MED FILL — IBUPROFEN 400 MG TAB: 400 mg | ORAL | Qty: 2

## 2019-06-03 MED FILL — GABAPENTIN 300 MG CAP: 300 mg | ORAL | Qty: 1

## 2019-06-03 MED FILL — CEFTRIAXONE 1 GRAM SOLUTION FOR INJECTION: 1 gram | INTRAMUSCULAR | Qty: 1

## 2019-06-03 MED FILL — MAGNESIUM OXIDE 400 MG TAB: 400 mg | ORAL | Qty: 1

## 2019-06-03 MED FILL — DILTIAZEM ER 240 MG 24 HR CAP: 240 mg | ORAL | Qty: 1

## 2019-06-03 MED FILL — LIDOCAINE 5 % (700 MG/PATCH) ADHESIVE PATCH: 5 % | CUTANEOUS | Qty: 1

## 2019-06-03 MED FILL — TYLENOL 325 MG TABLET: 325 mg | ORAL | Qty: 2

## 2019-06-03 MED FILL — QUETIAPINE SR 50 MG 24 HR TAB: 50 mg | ORAL | Qty: 3

## 2019-06-03 MED FILL — ROSUVASTATIN 5 MG TAB: 5 mg | ORAL | Qty: 1

## 2019-06-03 NOTE — Progress Notes (Signed)
Problem: Falls - Risk of  Goal: *Absence of Falls  Description: Document Schmid Fall Risk and appropriate interventions in the flowsheet.  Outcome: Progressing Towards Goal  Note: Fall Risk Interventions:  Mobility Interventions: Bed/chair exit alarm, Patient to call before getting OOB, Communicate number of staff needed for ambulation/transfer, Assess mobility with egress test, PT Consult for mobility concerns, PT Consult for assist device competence, Utilize walker, cane, or other assistive device         Medication Interventions: Bed/chair exit alarm, Evaluate medications/consider consulting pharmacy, Patient to call before getting OOB, Teach patient to arise slowly    Elimination Interventions: Bed/chair exit alarm, Call light in reach, Toilet paper/wipes in reach, Stay With Me (per policy), Patient to call for help with toileting needs, Toileting schedule/hourly rounds

## 2019-06-03 NOTE — Progress Notes (Addendum)
Hospitalist Progress Note    Patient: [name] [name]               Sex: female          DOA: 06/01/2019       Date of Birth:  1951-04-07      Age:  68 y.o.        LOS:  LOS: 2 days     PCP: Wallene Huh, PA-C   Assessment and Plan:   Acute COPD exacerbation  Left hip pain  Acute hypoxic respiratory failure due to above  Left lingular lobe pneumonia  Urinary tract infection with urinary retention  Insulin-dependent diabetes mellitus type 2 uncontrolled with hyperglycemia  Peripheral neuropathy  Hyperlipidemia  Hypertension  History of tobacco abuse  Bullous emphysema  Chronic pain syndrome  GERD  Known 2 cm left lower lobe nodule    Plan  Continue supplemental oxygen, wean off as tolerated.  COVID-19 negative on admission  Continue antibiotic IV Rocephin, azithromycin, strep, Legionella antigen negative.   Urine culture contaminated, covered with antibiotic  Monitor with bladder check for urine retention  Wean down IV steroids, switch to oral prednisone soon  PT OT evaluation  Continue gabapentin for peripheral neuropathy.  Glucomander insulin regimen  Hold losartan, low Aldactone, marginal hyperkalemia, today's labs pending.  Patient is on scheduled Motrin, monitor renal function daily  Continue Breo, nebulizer treatment around-the-clock  Orthopedic evaluated, plan for outpatient evaluation.  Patient to follow-up with Dr. Orene Desanctis in 1 to 2 weeks after discharge.  Patient needs to be out of bed in chair.  Please provide incentive spirometry  DVT prophylaxis  ??  Subjective: In bed, hip pain managed with pain meds.     [name] [name] is a 68 y.o. year old female who is being seen for COPD exacerbation    Objective:      Vital Signs:  Visit Vitals  BP 110/72 (BP 1 Location: Right arm, BP Patient Position: Supine)   Pulse 81   Temp 97.4 ??F (36.3 ??C)   Resp 22   Ht 5' 7"  (1.702 m)   Wt 97.9 kg (215 lb 13.3 oz)   SpO2 100%   BMI 33.80 kg/m??     Physical Exam:  GENERAL: Alert and oriented times 3. Obese.   HEAD: Head normocephalic, atraumatic   Eyes: Conjunctivae pink, sclera anicteric  CARDIOVASCULAR: S1 and S2 heard, no murmur  RESPIRATORY: Effort normal. no crackles. no rhonchi  ABDOMEN: Soft, nontender, nondistended.  NEUROLOGIC: Alert and oriented times 3. No focal weakness  SKIN: Warm and dry.  Extremity: no edema  PSYCHIATRIC: Normal affect.    Intake and Output:  Last three shifts:  12/19 1901 - 12/21 0700  In: 456 [P.O.:456]  Out: 3200 [Urine:3200]    Lab Results:  Recent Results (from the past 24 hour(s))   TSH 3RD GENERATION    Collection Time: 06/02/19 12:43 PM   Result Value Ref Range    TSH 0.170 (L) 0.358 - 3.740 uIU/mL   CBC W/O DIFF    Collection Time: 06/02/19 12:43 PM   Result Value Ref Range    WBC 13.1 (H) 4.0 - 11.0 1000/mm3    RBC 4.47 3.60 - 5.20 M/uL    HGB 13.6 13.0 - 17.2 gm/dl    HCT 40.0 37.0 - 50.0 %    MCV 89.5 80.0 - 98.0 fL    MCH 30.4 25.4 - 34.6 pg    MCHC 34.0 30.0 - 36.0 gm/dl  PLATELET 186 140 - 450 1000/mm3    MPV 11.4 (H) 6.0 - 10.0 fL    RDW-SD 42.9 36.4 - 63.7     METABOLIC PANEL, COMPREHENSIVE    Collection Time: 06/02/19 12:43 PM   Result Value Ref Range    Sodium 134 (L) 136 - 145 mEq/L    Potassium 5.2 (H) 3.5 - 5.1 mEq/L    Chloride 100 98 - 107 mEq/L    CO2 24 21 - 32 mEq/L    Glucose 334 (H) 74 - 106 mg/dl    BUN 22 7 - 25 mg/dl    Creatinine 1.2 0.6 - 1.3 mg/dl    GFR est AA 58.0      GFR est non-AA 48      Calcium 9.2 8.5 - 10.1 mg/dl    AST (SGOT) 9 (L) 15 - 37 U/L    ALT (SGPT) 16 12 - 78 U/L    Alk. phosphatase 91 45 - 117 U/L    Bilirubin, total 0.3 0.2 - 1.0 mg/dl    Protein, total 7.9 6.4 - 8.2 gm/dl    Albumin 3.6 3.4 - 5.0 gm/dl    Anion gap 11 5 - 15 mmol/L   GLUCOSE, POC    Collection Time: 06/02/19  4:42 PM   Result Value Ref Range    Glucose (POC) 439 (HH) 65 - 105 mg/dL   GLUCOSE, POC    Collection Time: 06/02/19  9:19 PM   Result Value Ref Range    Glucose (POC) 352 (H) 65 - 105 mg/dL   GLUCOSE, POC    Collection Time: 06/03/19  7:44 AM    Result Value Ref Range    Glucose (POC) 308 (H) 65 - 105 mg/dL      Medications:  Current Facility-Administered Medications   Medication Dose Route Frequency   ??? methylPREDNISolone (PF) (SOLU-MEDROL) injection 40 mg  40 mg IntraVENous Q12H   ??? ibuprofen (MOTRIN) tablet 800 mg  800 mg Oral TID   ??? famotidine (PEPCID) tablet 20 mg  20 mg Oral BID   ??? lidocaine (LIDODERM) 5 % patch 1 Patch  1 Patch TransDERmal Q24H   ??? dextrose (D50) infusion 5-25 g  10-50 mL IntraVENous PRN   ??? glucagon (GLUCAGEN) injection 1 mg  1 mg IntraMUSCular PRN   ??? insulin glargine (LANTUS) injection 1-100 Units  1-100 Units SubCUTAneous QHS   ??? insulin lispro (HUMALOG) injection 1-100 Units  1-100 Units SubCUTAneous AC&HS   ??? albuterol-ipratropium (DUO-NEB) 2.5 MG-0.5 MG/3 ML  3 mL Nebulization Q6H RT   ??? cefTRIAXone (ROCEPHIN) 1 g in 0.9% sodium chloride (MBP/ADV) 50 mL MBP  1 g IntraVENous Q24H   ??? cloNIDine HCL (CATAPRES) tablet 0.3 mg  0.3 mg Oral BID   ??? dilTIAZem ER (CARDIZEM CD) capsule 240 mg  240 mg Oral DAILY   ??? gabapentin (NEURONTIN) capsule 300 mg  300 mg Oral TID   ??? guaiFENesin ER (MUCINEX) tablet 600 mg  600 mg Oral Q12H   ??? losartan (COZAAR) tablet 50 mg  50 mg Oral DAILY   ??? magnesium oxide (MAG-OX) tablet 400 mg  400 mg Oral DAILY   ??? montelukast (SINGULAIR) tablet 10 mg  10 mg Oral QHS   ??? QUEtiapine SR (SEROquel XR) tablet 150 mg  150 mg Oral QHS PRN   ??? spironolactone (ALDACTONE) tablet 100 mg  100 mg Oral DAILY   ??? rosuvastatin (CRESTOR) tablet 5 mg  5 mg Oral QHS   ???  tiotropium bromide (SPIRIVA RESPIMAT) 2.5 mcg /actuation  1 Puff Inhalation DAILY   ??? traZODone (DESYREL) tablet 50 mg  50 mg Oral QHS PRN   ??? sodium chloride (NS) flush 5-10 mL  5-10 mL IntraVENous Q8H   ??? sodium chloride (NS) flush 5-10 mL  5-10 mL IntraVENous PRN   ??? naloxone (NARCAN) injection 0.1 mg  0.1 mg IntraVENous PRN   ??? acetaminophen (TYLENOL) tablet 650 mg  650 mg Oral Q6H PRN    ??? heparin (porcine) injection 5,000 Units  5,000 Units SubCUTAneous Q12H   ??? azithromycin (ZITHROMAX) 500 mg in 0.9% sodium chloride 250 mL IVPB  500 mg IntraVENous Q24H   ??? polyethylene glycol (MIRALAX) packet 17 g  17 g Oral DAILY PRN   ??? methocarbamoL (ROBAXIN) tablet 500 mg  500 mg Oral QID   ??? butalbital-acetaminophen-caffeine (FIORICET, ESGIC) 50-325-40 mg per tablet 1 Tab  1 Tab Oral Q4H PRN   ??? fluticasone-vilanterol (BREO ELLIPTA) 128mg-25mcg/puff  1 Puff Inhalation DAILY     Dragon dictation software was used for portions of this report. Unintended errors in transcription may occur.  KEdger [facility] MD  June 03, 2019  8:50 AM

## 2019-06-03 NOTE — Other (Signed)
Bedside and Verbal shift change report given to Levell July, RN   (oncoming nurse) by Waynetta Sandy, RN (offgoing nurse). Report included the following information SBAR, Kardex, [facility] and Cardiac Rhythm SR.
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
## 2019-06-03 NOTE — Progress Notes (Addendum)
PHYSICAL THERAPY EVALUATION:         Patient: [name] [name] (68 y.o. female)  Room: 2212/2212  [x]   Patient DOB Verified    Date of Admission: 06/01/2019   Length of Stay:  2 day(s)  Primary Diagnosis: Hypoxia [R09.02]  COPD exacerbation ([place]) [J44.1]  UTI (urinary tract infection) [N39.0]       Insurance: Payor: [employer] MEDICARE / Plan: [place] HMO / Product Type: Managed Care Medicare /      Date: 06/03/2019  In time: 13:08  Out time:  13:32     Precautions: Falls.      Isolation:  There are currently no Active Isolations       MDRO: No active infections     Equipment: rolling walker     Assessment:      Based on the objective data described below, the patient presents with  - decreased mobility affecting function  - impaired gait  - impaired balance  - increased pain  - impaired activity tolerance  - impaired LE strength  - decreased independence with functional mobility  - Fairly motivated to participate in PT to improve functional activity tolerance and return to prior level of function    Patient???s rehabilitation potential for below stated goals: Good.    Recommendations:  Recommend continued physical therapy during acute stay.  . Recommend out of bed activity to counteract ill effects of bedrest, with assistance from staff as needed.  Discharge Recommendations: TBD, pending progress with PT and status at time of dc, Home with HH vs SNF pending O2 requirements.  Further Equipment Recommendations for Discharge: no needs anticipated; has DME available at home.     Plan:      Patient will benefit from skilled Physical Therapy intervention to address the above impairments to return to prior level of function. Patient will be seen 1-5 times/week for 2 weeks.    Patient will achieve PT goals in 2 weeks. Goals were created with  input from the patient.    Physical Therapy Goals:      - Patient will be independent with bed mobility and will be modified independent with transfers in preparation for OOB activities and ambulation.  - Patient will tolerate sitting up in chair for meals.  - Patient will ambulate with modified independence for 150 feet with the least restrictive device to promote functional independence at home.   - Patient will ascend/descend 2 stairs with handrail(s) with minimal assistance/contact guard assist to enter home and/or go to upstairs bedroom/bathroom.  - Patient will demonstrate good balance to safely enable upright activities and reduce risk for falls.  - Patient will demonstrate good activity tolerance during functional activities.  - Patient will demonstrate good safety awareness during functional activities.  - Patient's pain will be below 4/10.  - Patient will be independent with lower extremity home exercise program to increase strength and endurance.  - Patient will utilize energy conservation techniques during functional activities.    Planned interventions:      Skilled Physical Therapy services will provide functional mobility training, therapeutic exercises, therapeutic activities, patient/caregiver education as indicated.  Skilled Physical Therapy services will modify and progress therapeutic interventions, address functional mobility deficits, address ROM and strength deficits, analyze and cue movement patterns and assess and modify postural abnormalities to reach the stated goals.    Subjective:      Patient agreeable to PT evaluation  Patient reports 6/10 pain before treatment and 6/10 pain at conclusion of treatment.  Objective Data Summary:      Orders, labs, and chart reviewed on [name] [name]. Communicated with patient's nurse.     Present illness history:   Patient Active Problem List    Diagnosis Date Noted   ??? UTI (urinary tract infection) 06/01/2019   ??? Hypoxia 06/01/2019   ??? COPD exacerbation (HCC) 05/07/2019    ??? Acute hypoxemic respiratory failure (HCC) 05/07/2019   ??? Respiratory failure with hypoxia (HCC) 08/22/2018   ??? Headache 08/05/2018   ??? Abdominal pain 02/04/2018   ??? SIRS (systemic inflammatory response syndrome) (HCC) 10/09/2017   ??? Hyperkalemia 09/08/2017   ??? Obtunded 09/08/2017   ??? Acute renal failure (ARF) (HCC) 09/08/2017   ??? Septic shock (HCC) 09/08/2017   ??? Metabolic encephalopathy 09/08/2017   ??? Dehydration 08/07/2017   ??? COPD with acute exacerbation (HCC) 08/07/2017   ??? Acute-on-chronic kidney injury (HCC) 08/07/2017   ??? Chest pain 06/15/2017   ??? Dyspnea 06/15/2017   ??? Type 2 diabetes mellitus with diabetic neuropathy (HCC) 12/07/2016   ??? 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 Cannabinoid hyperemesis syndrome 08/17/2016   ??? Gastritis 08/16/2016   ??? Nausea & vomiting 08/16/2016   ??? Asthma with acute exacerbation 08/04/2016   ??? Lactic acidosis 07/24/2016   ??? Leukocytosis 07/24/2016   ??? Sepsis (HCC) 07/24/2016   ??? Asthma exacerbation 07/24/2016   ??? Type 2 diabetes mellitus with nephropathy (HCC) 06/12/2016   ??? Sacroiliitis (HCC) 11/25/2015   ??? Essential hypertension 10/06/2015   ??? 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 Lumbar and sacral osteoarthritis 09/15/2015   ??? Chronic pain syndrome 09/15/2015   ??? Type 2 diabetes mellitus with hyperglycemia, without long-term current use of insulin (HCC) 08/20/2015   ??? Acute colitis 07/02/2015   ??? Acute hyperglycemia 07/02/2015   ??? Accelerated hypertension 04/08/2015   ??? Severe headache 04/07/2015   ??? 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 PTSD (post-traumatic stress disorder) 01/29/2015   ??? Severe hypertension 07/21/2014      Previous medical history:   Past Medical History:   Diagnosis Date   ??? Arthritis    ??? 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 DM2 (diabetes mellitus, type 2) (HCC)     ??? GERD (gastroesophageal reflux disease)    ??? Hepatitis C     HEP C   ??? HTN (hypertension)    ??? Lung nodule, multiple     (CT 10/2016) Small left upper lobe and 1.7 x 1.6 cm left lower lobe nodules not significantly changed from 07/23/2016 and 03/22/2016   ??? Marijuana use    ??? PTSD (post-traumatic stress disorder)     [place] [employer] [place]   ??? Thoracic ascending aortic aneurysm (HCC)     4.4cm noted on CT Chest (10/2016)   ??? Thyroid nodule     2.5 cm stable right thyroid nodule (CT 10/2016)        Prior Level of Function/Home Situation:   Information was obtained by patient and patient's spouse at bedside  Home environment:  Patient lives with her husband in a two story town house, 2 small STE and FOS to second Floor. Pt has stair lift to second floor  Prior level of function: Mod I with RW short household distances, per spouse pt spends  most of the time on 2nd floor and will use stair lift to come down to first floor.  Assist for tx into tub as pt takes baths, Indep washing. Pt's husband performs iADLs.  Pt has license and can drive, however pt's husband drives all the time   Home equipment: rolling walker, straight cane, stair lift to 2nd floor    Patient found:     Supine in bed. (+) bed alarm. (+) pure wick. (+) O2 nasal cannula. (+) spouse present.    Patient received/participated in 10 minutes of treatment (gait training) during/immediately following PT evaluation.    Cognitive Status:     Mental Status: Alert and oriented x 3  Communication: normal  Follows commands: intact; consistently follows commands  General Cognition: intact  Safety/Judgement: decreased awareness of need for safety    Extremities Assessment:      Sensation:  Intact  Pt reporting baseline neuropathy for bilat feet    Strength:    WFL Grossly 4/5    Range Of Motion:  [facility]    Therapeutic Activities; Functional Mobility and Balance Status:       Focus on functional mobility training, bed mobility training, gait training, stair training, therapeutic exercises, therapeutic activities, patient education. Provided cueing for hand placement, technique, and safety.      Bed mobility:    Functional Status   Comments   Supine to sit supervision, requires increased time, use of bedrail, HOB elevated     Sit to supine not tested        Transfers:    Functional Status   Comments   Sit to stand standby assist    Stand to sit contact guard, poor safety awareness, cues for technique    Bed to bedside chair contact guard       Ambulation/Gait Training:     Distance  50 feet    Analysis  slow, steady reciprocal gait pattern, DOE noted      Assistance  standby assist and cueing for breathing technique and energy conservation and activity pacing    DME  rolling walker    Pt very fatigued and SOB from ambulation requiring seated rest break in hallway.  O2 sat 86% on RA, pt requiring extended seated rest break with O2 sat 87% on RA.  Pt very fatigued from ambulation in hallway requesting ride back to room, pt safely wheeled back to room in bedside chair.     Stair training  not tested, unable        Functional Balance  Sitting      Static sitting   Normal static: patient able to maintain steady balance without handhold support     Dynamic sitting   Good dynamic: patient accepts moderate challenge      Functional Balance Standing     Static standing   fair (accepts min challenge)    Dynamic standing   fair (accepts min challenge)     Therapeutic Exercises:      Therapeutic exercises  BLE, performed a few reps of each, AROM:   ankle pumps, heel slides, straight leg raise       Activity Tolerance:     Poor activity tolerance   O2 sat:  98% on 2L at rest  94% on RA at rest   86% ambulating on RA, HR 121bpm. Pt requiring extended seated rest break for recovery, VC for PLB with poor carry over. O2 sat 87% on RA and 2L O2 reapplied.  Pt unable to  ambulate back to room requiring ride in chair back to room.   Respiratory therapy waiting in room upon return.     Final Location:     Seated in bed side chair, all needs within reach. Patient agrees to call for assistance. (+) O2. Respiratory Therapy present.    Communication/Education:     Education: OOB with assist from staff, OOB activity as tolerated, OOB to chair for meals and mobilize as tolerated, activity pacing, energy conservation, safety, disposition, role of PT, PT plan of care.    Education provided to: patient   Opportunity for questions and clarification was provided.    Readiness to learn indicated by: verbalized understanding    Barriers to learning/limitations:  None    Comprehension: Patient communicated comprehension and In agreement      Thank you for this referral.  Gwendolyn Fill, PT, DPT
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
## 2019-06-03 NOTE — Consults (Signed)
Consults by Myra Rude, MD at 06/03/19 [phone]                Author: Myra Rude, MD  Service: Orthopedic Surgery  Author Type: Physician       Filed: 06/03/19 0750  Date of Service: 06/03/19 0748  Status: Signed          Editor: Myra Rude, MD (Physician)            Consult Orders        1. IP CONSULT TO PHYSICIAN [[id number]] ordered by Westley Chandler, MD at 06/02/19 [place] [address]   Office: [phone]      Orthopaedic Consult Note      Patient: [name] [name]               Sex: female          DOA: 06/01/2019         Date of Birth:  1950/07/19      Age:  68 y.o.        LOS:  LOS: 2 days          CC:   Left hip pain.      HPI:   Patient is a 68 year old female with a history of right total hip replacement, who has noted a several year history of left hip pain.  Her pain is worsened recently.  She presents for evaluation and management.  She is here, in house, secondary to pulmonary  issues.       PAST MEDICAL HISTORY:     Past Medical History:        Diagnosis  Date         ?  Arthritis       ?  Chronic pain syndrome            related to R hip replacement         ?  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  DM2 (diabetes mellitus, type 2) ([place])       ?  GERD (gastroesophageal reflux disease)       ?  Hepatitis C            HEP C         ?  HTN (hypertension)       ?  Lung nodule, multiple            (CT 10/2016) Small left upper lobe and 1.7 x 1.6 cm left lower lobe nodules not significantly changed from 07/23/2016 and 03/22/2016         ?  Marijuana use       ?  PTSD (post-traumatic stress disorder)            lived through the [place] in 1993         ?  Thoracic ascending aortic aneurysm ([place])            4.4cm noted on CT Chest (10/2016)         ?  Thyroid nodule  2.5 cm stable right thyroid nodule (CT 10/2016)             PAST SURGICAL  HISTORY:       Past Surgical History:         Procedure  Laterality  Date          ?  HX ENDOSCOPY         ?  HX HERNIA REPAIR    2019          x2          ?  HX HIP REPLACEMENT  Right  01/29/2015     ?  HX HIP REPLACEMENT         ?  HX HYSTERECTOMY    2010          hysterectomy             FAMILY HISTORY:     Family History         Problem  Relation  Age of Onset          ?  Hypertension  Mother       ?  Other  Mother                OSA          ?  Cancer  Father                suspected leukemia per pt's spouse          ?  Cancer  Maternal Aunt            ?  Alcohol abuse  Maternal Grandfather               SOCIAL HISTORY:     Social History          Socioeconomic History         ?  Marital status:  MARRIED              Spouse name:  Not on file         ?  Number of children:  Not on file     ?  Years of education:  Not on file     ?  Highest education level:  Not on file       Tobacco Use         ?  Smoking status:  Former Smoker     ?  Smokeless tobacco:  Never Used        ?  Tobacco comment: reported as quit smoking around 1990s per spouse       Substance and Sexual Activity         ?  Alcohol use:  No     ?  Drug use:  Not Currently              Types:  Marijuana             Comment: states she has not used marijuana in months         ?  Sexual activity:  Never             REVIEW OF SYSTEMS:   See chart.      PHYSICAL EXAMINATION:         Exam for E/M Coding:   GENERAL: well developed, well nourished.   EYES: lids and conjunctiva are normal   E/N/T: Normal external ears and nose;   NECK: neck supple   CARDIOVASCULAR: No  edema   MUSCULOSKELETAL: digits/nails: no clubbing, cyanosis, or evidence of ischemia or infection;    SKIN: no ulcerations, lesions or rashes   NEUROLOGIC: sensation, grossly normal;   PSYCHIATRIC: Mental status:alert and oriented x3; appropriate affect and demeanor; good insight and judgement         Procedures/imaging: see electronic medical records for all procedures, Xrays and details  which were not copied into this note but were reviewed.       INDICATION:   non-traumatic right hip pain       ??   EXAMINATION:   XR HIP RT W OR WO PELV 2-3 VWS   ??   COMPARISON:   11/17/2015   ??   FINDINGS:   Total right hip prosthesis in good position. No evidence of a fracture. Moderate   degeneration of the left hip.   ??   IMPRESSION   IMPRESSION:   1. Total right hip prosthesis in good position.   2. Negative for fracture.   3. Moderate osteoarthritis of the left hip.      Allergies:     Allergies        Allergen  Reactions         ?  Chocolate [Cocoa]  Sneezing             Confirms allergy but reports "I still eat it"           Home Medications:     Prior to Admission Medications     Prescriptions  Last Dose  Informant  Patient Reported?  Taking?      LANTUS SOLOSTAR U-100 INSULIN 100 unit/mL (3 mL) inpn      No  Yes      Sig: INJECT 32 UNITS SUBCUTANEOUSLY  NIGHTLY      QUEtiapine SR (SEROQUEL XR) 150 mg sr tablet      Yes  Yes      Sig: Take 150 mg by mouth nightly as needed.      albuterol (ACCUNEB) 1.25 mg/3 mL nebu      No  Yes      Sig: USE 1 VIAL IN NEBULIZER EVERY 6 HOURS AS NEEDED FOR  SHORTNESS  OF  BREATH,  WHEEZING      butalbital-acetaminophen-caff (FIORICET) 50-300-40 mg per capsule      No  Yes      Sig: Take 1 Cap by mouth every four (4) hours as needed for Headache.      cetirizine (ZYRTEC) 10 mg tablet      No  Yes      Sig: Take 1 Tab by mouth daily.      cloNIDine HCl (CATAPRES) 0.1 mg tablet      No  Yes      Sig: Take 3 Tabs by mouth two (2) times a day.      dilTIAZem CD (CARDIZEM CD) 240 mg ER capsule      No  Yes      Sig: Take 1 Cap by mouth daily.      fluticasone propion-salmeterol (ADVAIR DISKUS) 100-50 mcg/dose diskus inhaler      No  Yes      Sig: Take 1 Puff by inhalation two (2) times a day.      fluticasone propionate (FLONASE) 50 mcg/actuation nasal spray      No  Yes      Sig: 2 Sprays by Both Nostrils route daily.      Patient taking differently: 2 Sprays by  Both Nostrils  route daily as needed.      gabapentin (NEURONTIN) 300 mg capsule      No  Yes      Sig: Take 1 Cap by mouth three (3) times daily. Max Daily Amount: 900 mg.      guaiFENesin ER (MUCINEX) 600 mg ER tablet      No  Yes      Sig: Take 1 Tab by mouth every twelve (12) hours.      ibuprofen (MOTRIN) 600 mg tablet      No  Yes      Sig: Take 1 Tab by mouth every six (6) hours as needed for Pain.      losartan (COZAAR) 50 mg tablet      No  Yes      Sig: Take 1 Tab by mouth daily.      magnesium oxide (MAG-OX) 400 mg tablet      Yes  Yes      Sig: Take 400 mg by mouth daily.      metFORMIN ER (GLUCOPHAGE XR) 500 mg tablet      No  Yes      Sig: TAKE 1 TABLET TWICE DAILY      methocarbamoL (Robaxin) 500 mg tablet      No  Yes      Sig: Take 1 Tab by mouth four (4) times daily.      montelukast (SINGULAIR) 10 mg tablet      No  Yes      Sig: TAKE 1 TABLET EVERY DAY      polyethylene glycol (MIRALAX) 17 gram packet      No  Yes      Sig: Take 1 Packet by mouth daily.      Patient taking differently: Take 17 g by mouth daily as needed for Constipation.      rosuvastatin (CRESTOR) 5 mg tablet      No  Yes      Sig: TAKE 1 TABLET BY MOUTH NIGHTLY      spironolactone (ALDACTONE) 100 mg tablet      No  Yes      Sig: TAKE 1 TABLET EVERY DAY      tiotropium bromide (SPIRIVA RESPIMAT) 2.5 mcg/actuation inhaler      No  Yes      Sig: Take 1 Puff by inhalation daily.      traZODone (DESYREL) 50 mg tablet      No  Yes      Sig: Take 1 Tab by mouth nightly as needed for Sleep.               Facility-Administered Medications: None              Chart and notes reviewed. Data reviewed. I have evaluated and examined the patient.                   IMPRESSION:   Osteoarthritis, left hip.       DISCUSSION:   Once the patient's medical status improves, she should see [place] as an outpatient.  We will set her up with Dr. Saul Fordyce, our hip arthroplasty surgeon, to discuss possible surgical options.   Please have the patient follow-up with Dr.  Delford Field, 1 to 2 weeks after discharge, at her convenience.  [phone] for appointment.          Arita Miss, MD    06/03/19

## 2019-06-03 NOTE — Progress Notes (Signed)
Progress Notes by Edger House, MD at 06/03/19 [phone]                Author: Edger House, MD  Service: Hospitalist  Author Type: Physician       Filed: 06/03/19 0917  Date of Service: 06/03/19 0850  Status: Addendum          Editor: Edger House, MD (Physician)          Related Notes: Original Note by Edger House, MD (Physician) filed at 06/03/19 0915                       Hospitalist Progress Note      Patient: [name] [name]               Sex: female          DOA: 06/01/2019         Date of Birth:  [birth year]      Age:  68 y.o.        LOS:  LOS: 2 days       PCP: Wallene Huh, PA-C      Assessment and Plan:     Acute COPD exacerbation   Left hip pain   Acute hypoxic respiratory failure due to above   Left lingular lobe pneumonia   Urinary tract infection with urinary retention   Insulin-dependent diabetes mellitus type 2 uncontrolled with hyperglycemia   Peripheral neuropathy   Hyperlipidemia   Hypertension   History of tobacco abuse   Bullous emphysema   Chronic pain syndrome   GERD   Known 2 cm left lower lobe nodule      Plan   Continue supplemental oxygen, wean off as tolerated.   COVID-19 negative on admission   Continue antibiotic IV Rocephin, azithromycin, strep, Legionella antigen negative.    Urine culture contaminated, covered with antibiotic   Monitor with bladder check for urine retention   Wean down IV steroids, switch to oral prednisone soon   PT OT evaluation   Continue gabapentin for peripheral neuropathy.   Glucomander insulin regimen   Hold losartan, low Aldactone, marginal hyperkalemia, today's labs pending.   Patient is on scheduled Motrin, monitor renal function daily   Continue Breo, nebulizer treatment around-the-clock   Orthopedic evaluated, plan for outpatient evaluation.  Patient to follow-up with Dr. Orene Desanctis in 1 to 2 weeks after discharge.   Patient needs to be out of bed in chair.  Please provide incentive spirometry   DVT  prophylaxis   ??     Subjective: In bed, hip pain managed with pain meds.        [name] [name] is a  68 y.o. year old female who is being seen for COPD exacerbation        Objective:         Vital Signs:   Visit Vitals      BP  110/72 (BP 1 Location: Right arm, BP Patient Position: Supine)     Pulse  81     Temp  97.4 ??F (36.3 ??C)     Resp  22     Ht  '5\' 7"'$  (1.702 m)     Wt  97.9 kg (215 lb 13.3 oz)     SpO2  100%        BMI  33.80 kg/m??        Physical Exam:   GENERAL: Alert and oriented times 3. Obese.   HEAD: Head normocephalic,  atraumatic    Eyes: Conjunctivae pink, sclera anicteric   CARDIOVASCULAR: S1 and S2 heard, no murmur   RESPIRATORY: Effort normal. no crackles. no rhonchi   ABDOMEN: Soft, nontender, nondistended.   NEUROLOGIC: Alert and oriented times 3. No focal weakness   SKIN: Warm and dry.   Extremity: no edema   PSYCHIATRIC: Normal affect.      Intake and Output:   Last three shifts:  12/19 1901 - 12/21 0700   In: 456 [P.O.:456]   Out: 3200 [Urine:3200]      Lab Results:     Recent Results (from the past 24 hour(s))     TSH 3RD GENERATION          Collection Time: 06/02/19 12:43 PM         Result  Value  Ref Range            TSH  0.170 (L)  0.358 - 3.740 uIU/mL       CBC W/O DIFF          Collection Time: 06/02/19 12:43 PM         Result  Value  Ref Range            WBC  13.1 (H)  4.0 - 11.0 1000/mm3       RBC  4.47  3.60 - 5.20 M/uL       HGB  13.6  13.0 - 17.2 gm/dl       HCT  40.0  37.0 - 50.0 %       MCV  89.5  80.0 - 98.0 fL       MCH  30.4  25.4 - 34.6 pg       MCHC  34.0  30.0 - 36.0 gm/dl       PLATELET  186  140 - 450 1000/mm3       MPV  11.4 (H)  6.0 - 10.0 fL       RDW-SD  42.9  36.4 - 26.9         METABOLIC PANEL, COMPREHENSIVE          Collection Time: 06/02/19 12:43 PM         Result  Value  Ref Range            Sodium  134 (L)  136 - 145 mEq/L       Potassium  5.2 (H)  3.5 - 5.1 mEq/L       Chloride  100  98 - 107 mEq/L       CO2  24  21 - 32 mEq/L       Glucose  334 (H)  74 - 106 mg/dl        BUN  22  7 - 25 mg/dl       Creatinine  1.2  0.6 - 1.3 mg/dl       GFR est AA  58.0          GFR est non-AA  48          Calcium  9.2  8.5 - 10.1 mg/dl       AST (SGOT)  9 (L)  15 - 37 U/L       ALT (SGPT)  16  12 - 78 U/L       Alk. phosphatase  91  45 - 117 U/L       Bilirubin, total  0.3  0.2 - 1.0 mg/dl  Protein, total  7.9  6.4 - 8.2 gm/dl       Albumin  3.6  3.4 - 5.0 gm/dl       Anion gap  11  5 - 15 mmol/L       GLUCOSE, POC          Collection Time: 06/02/19  4:42 PM         Result  Value  Ref Range            Glucose (POC)  439 (HH)  65 - 105 mg/dL       GLUCOSE, POC          Collection Time: 06/02/19  9:19 PM         Result  Value  Ref Range            Glucose (POC)  352 (H)  65 - 105 mg/dL       GLUCOSE, POC          Collection Time: 06/03/19  7:44 AM         Result  Value  Ref Range            Glucose (POC)  308 (H)  65 - 105 mg/dL         Medications:     Current Facility-Administered Medications          Medication  Dose  Route  Frequency           ?  methylPREDNISolone (PF) (SOLU-MEDROL) injection 40 mg   40 mg  IntraVENous  Q12H           ?  ibuprofen (MOTRIN) tablet 800 mg   800 mg  Oral  TID     ?  famotidine (PEPCID) tablet 20 mg   20 mg  Oral  BID     ?  lidocaine (LIDODERM) 5 % patch 1 Patch   1 Patch  TransDERmal  Q24H     ?  dextrose (D50) infusion 5-25 g   10-50 mL  IntraVENous  PRN     ?  glucagon (GLUCAGEN) injection 1 mg   1 mg  IntraMUSCular  PRN     ?  insulin glargine (LANTUS) injection 1-100 Units   1-100 Units  SubCUTAneous  QHS     ?  insulin lispro (HUMALOG) injection 1-100 Units   1-100 Units  SubCUTAneous  AC&HS     ?  albuterol-ipratropium (DUO-NEB) 2.5 MG-0.5 MG/3 ML   3 mL  Nebulization  Q6H RT     ?  cefTRIAXone (ROCEPHIN) 1 g in 0.9% sodium chloride (MBP/ADV) 50 mL MBP   1 g  IntraVENous  Q24H     ?  cloNIDine HCL (CATAPRES) tablet 0.3 mg   0.3 mg  Oral  BID     ?  dilTIAZem ER (CARDIZEM CD) capsule 240 mg   240 mg  Oral  DAILY     ?  gabapentin (NEURONTIN) capsule  300 mg   300 mg  Oral  TID     ?  guaiFENesin ER (MUCINEX) tablet 600 mg   600 mg  Oral  Q12H     ?  losartan (COZAAR) tablet 50 mg   50 mg  Oral  DAILY     ?  magnesium oxide (MAG-OX) tablet 400 mg   400 mg  Oral  DAILY     ?  montelukast (SINGULAIR) tablet 10 mg   10 mg  Oral  QHS     ?  QUEtiapine SR (SEROquel XR) tablet 150 mg   150 mg  Oral  QHS PRN     ?  spironolactone (ALDACTONE) tablet 100 mg   100 mg  Oral  DAILY           ?  rosuvastatin (CRESTOR) tablet 5 mg   5 mg  Oral  QHS           ?  tiotropium bromide (SPIRIVA RESPIMAT) 2.5 mcg /actuation   1 Puff  Inhalation  DAILY     ?  traZODone (DESYREL) tablet 50 mg   50 mg  Oral  QHS PRN     ?  sodium chloride (NS) flush 5-10 mL   5-10 mL  IntraVENous  Q8H     ?  sodium chloride (NS) flush 5-10 mL   5-10 mL  IntraVENous  PRN     ?  naloxone (NARCAN) injection 0.1 mg   0.1 mg  IntraVENous  PRN     ?  acetaminophen (TYLENOL) tablet 650 mg   650 mg  Oral  Q6H PRN     ?  heparin (porcine) injection 5,000 Units   5,000 Units  SubCUTAneous  Q12H     ?  azithromycin (ZITHROMAX) 500 mg in 0.9% sodium chloride 250 mL IVPB   500 mg  IntraVENous  Q24H     ?  polyethylene glycol (MIRALAX) packet 17 g   17 g  Oral  DAILY PRN     ?  methocarbamoL (ROBAXIN) tablet 500 mg   500 mg  Oral  QID     ?  butalbital-acetaminophen-caffeine (FIORICET, ESGIC) 50-325-40 mg per tablet 1 Tab   1 Tab  Oral  Q4H PRN           ?  fluticasone-vilanterol (BREO ELLIPTA) 120mg-25mcg/puff   1 Puff  Inhalation  DAILY        Dragon dictation software was used for portions of this report. Unintended errors in transcription may occur.   KEdger House MD   June 03, 2019   8:50 AM

## 2019-06-03 NOTE — Progress Notes (Signed)
Problem: Falls - Risk of  Goal: *Absence of Falls  Description: Document [name] [name] Fall Risk and appropriate interventions in the flowsheet.  Outcome: Progressing Towards Goal  Note: Fall Risk Interventions:  Mobility Interventions: Bed/chair exit alarm, Patient to call before getting OOB, Communicate number of staff needed for ambulation/transfer, Assess mobility with egress test, PT Consult for mobility concerns, PT Consult for assist device competence, Utilize walker, cane, or other assistive device         Medication Interventions: Bed/chair exit alarm, Evaluate medications/consider consulting pharmacy, Patient to call before getting OOB, Teach patient to arise slowly    Elimination Interventions: Bed/chair exit alarm, Call light in reach, Toilet paper/wipes in reach, Stay With Me (per policy), Patient to call for help with toileting needs, Toileting schedule/hourly rounds

## 2019-06-04 LAB — CBC WITH AUTO DIFFERENTIAL
Basophils %: 0.2 % (ref 0–3)
Eosinophils %: 0 % (ref 0–5)
Hematocrit: 41.9 % (ref 37.0–50.0)
Hemoglobin: 13.8 gm/dl (ref 13.0–17.2)
Immature Granulocytes: 1.3 % (ref 0.0–3.0)
Lymphocytes %: 7.3 % — ABNORMAL LOW (ref 28–48)
MCH: 29.2 pg (ref 25.4–34.6)
MCHC: 32.9 gm/dl (ref 30.0–36.0)
MCV: 88.8 fL (ref 80.0–98.0)
MPV: 11.3 fL — ABNORMAL HIGH (ref 6.0–10.0)
Monocytes %: 7.6 % (ref 1–13)
Neutrophils %: 83.6 % — ABNORMAL HIGH (ref 34–64)
Nucleated RBCs: 0 (ref 0–0)
Platelets: 205 10*3/uL (ref 140–450)
RBC: 4.72 M/uL (ref 3.60–5.20)
RDW-SD: 42.7 (ref 36.4–46.3)
WBC: 12.7 10*3/uL — ABNORMAL HIGH (ref 4.0–11.0)

## 2019-06-04 LAB — POCT GLUCOSE
POC Glucose: 352 mg/dL — ABNORMAL HIGH (ref 65–105)
POC Glucose: 279 mg/dL — ABNORMAL HIGH (ref 65–105)
POC Glucose: 330 mg/dL — ABNORMAL HIGH (ref 65–105)
POC Glucose: 338 mg/dL — ABNORMAL HIGH (ref 65–105)

## 2019-06-04 LAB — BASIC METABOLIC PANEL
Anion Gap: 4 mmol/L — ABNORMAL LOW (ref 5–15)
BUN: 33 mg/dl — ABNORMAL HIGH (ref 7–25)
CO2: 31 mEq/L (ref 21–32)
Calcium: 10.1 mg/dl (ref 8.5–10.1)
Chloride: 98 mEq/L (ref 98–107)
Creatinine: 1.1 mg/dl (ref 0.6–1.3)
EGFR IF NonAfrican American: 53
GFR African American: >60
Glucose: 313 mg/dl — ABNORMAL HIGH (ref 74–106)
Potassium: 4.9 mEq/L (ref 3.5–5.1)
Sodium: 133 mEq/L — ABNORMAL LOW (ref 136–145)

## 2019-06-04 LAB — CBC WITH AUTOMATED DIFF
BASOPHILS: 0.2 % (ref 0–3)
EOSINOPHILS: 0 % (ref 0–5)
HCT: 41.9 % (ref 37.0–50.0)
HGB: 13.8 gm/dl (ref 13.0–17.2)
IMMATURE GRANULOCYTES: 1.3 % (ref 0.0–3.0)
LYMPHOCYTES: 7.3 % — ABNORMAL LOW (ref 28–48)
MCH: 29.2 pg (ref 25.4–34.6)
MCHC: 32.9 gm/dl (ref 30.0–36.0)
MCV: 88.8 fL (ref 80.0–98.0)
MONOCYTES: 7.6 % (ref 1–13)
MPV: 11.3 fL — ABNORMAL HIGH (ref 6.0–10.0)
NEUTROPHILS: 83.6 % — ABNORMAL HIGH (ref 34–64)
NRBC: 0 (ref 0–0)
PLATELET: 205 10*3/uL (ref 140–450)
RBC: 4.72 M/uL (ref 3.60–5.20)
RDW-SD: 42.7 (ref 36.4–46.3)
WBC: 12.7 10*3/uL — ABNORMAL HIGH (ref 4.0–11.0)

## 2019-06-04 LAB — GLUCOSE, POC
Glucose (POC): 352 mg/dL — ABNORMAL HIGH (ref 65–105)
Glucose (POC): 279 mg/dL — ABNORMAL HIGH (ref 65–105)
Glucose (POC): 330 mg/dL — ABNORMAL HIGH (ref 65–105)
Glucose (POC): 338 mg/dL — ABNORMAL HIGH (ref 65–105)

## 2019-06-04 LAB — METABOLIC PANEL, BASIC
Anion gap: 4 mmol/L — ABNORMAL LOW (ref 5–15)
BUN: 33 mg/dl — ABNORMAL HIGH (ref 7–25)
CO2: 31 mEq/L (ref 21–32)
Calcium: 10.1 mg/dl (ref 8.5–10.1)
Chloride: 98 mEq/L (ref 98–107)
Creatinine: 1.1 mg/dl (ref 0.6–1.3)
GFR est AA: >60
GFR est non-AA: 53
Glucose: 313 mg/dl — ABNORMAL HIGH (ref 74–106)
Potassium: 4.9 mEq/L (ref 3.5–5.1)
Sodium: 133 mEq/L — ABNORMAL LOW (ref 136–145)

## 2019-06-04 MED ORDER — PATIROMER CALCIUM SORBITEX 8.4 GRAM ORAL POWDER PACKET
8.4 gram | ORAL | Status: AC
Start: 2019-06-04 — End: 2019-06-04
  Administered 2019-06-04: 15:00:00 via ORAL

## 2019-06-04 MED ORDER — AZITHROMYCIN 250 MG TAB
250 mg | Freq: Every day | ORAL | Status: AC
Start: 2019-06-04 — End: 2019-06-05
  Administered 2019-06-04 – 2019-06-05 (×2): via ORAL

## 2019-06-04 MED ORDER — FUROSEMIDE 10 MG/ML IJ SOLN
10 mg/mL | Freq: Once | INTRAMUSCULAR | Status: AC
Start: 2019-06-04 — End: 2019-06-04
  Administered 2019-06-04: 15:00:00 via INTRAVENOUS

## 2019-06-04 MED ORDER — METHOCARBAMOL 500 MG TAB
500 mg | Freq: Three times a day (TID) | ORAL | Status: DC | PRN
Start: 2019-06-04 — End: 2019-06-05
  Administered 2019-06-05: 15:00:00 via ORAL

## 2019-06-04 MED ORDER — PREDNISONE 20 MG TAB
20 mg | Freq: Every day | ORAL | Status: DC
Start: 2019-06-04 — End: 2019-06-05
  Administered 2019-06-04 – 2019-06-05 (×2): via ORAL

## 2019-06-04 MED ORDER — IBUPROFEN 600 MG TAB
600 mg | Freq: Three times a day (TID) | ORAL | Status: DC
Start: 2019-06-04 — End: 2019-06-04

## 2019-06-04 MED ORDER — CEPHALEXIN 500 MG CAP
500 mg | Freq: Four times a day (QID) | ORAL | Status: DC
Start: 2019-06-04 — End: 2019-06-05
  Administered 2019-06-04 – 2019-06-05 (×4): via ORAL

## 2019-06-04 MED ORDER — POLYETHYLENE GLYCOL 3350 17 GRAM (100 %) ORAL POWDER PACKET
17 gram | Freq: Every day | ORAL | Status: DC
Start: 2019-06-04 — End: 2019-06-05
  Administered 2019-06-04 – 2019-06-05 (×2): via ORAL

## 2019-06-04 MED ORDER — SENNOSIDES-DOCUSATE SODIUM 8.6 MG-50 MG TAB
ORAL | Status: AC
Start: 2019-06-04 — End: 2019-06-04
  Administered 2019-06-04: 15:00:00 via ORAL

## 2019-06-04 MED FILL — SODIUM CHLORIDE 0.9 % IJ SYRG: INTRAMUSCULAR | Qty: 10

## 2019-06-04 MED FILL — SENOKOT-S 8.6 MG-50 MG TABLET: ORAL | Qty: 1

## 2019-06-04 MED FILL — TYLENOL 325 MG TABLET: 325 mg | ORAL | Qty: 2

## 2019-06-04 MED FILL — AZITHROMYCIN 500 MG IV SOLUTION: 500 mg | INTRAVENOUS | Qty: 5

## 2019-06-04 MED FILL — CLONIDINE 0.1 MG TAB: 0.1 mg | ORAL | Qty: 1

## 2019-06-04 MED FILL — POLYETHYLENE GLYCOL 3350 17 GRAM (100 %) ORAL POWDER PACKET: 17 gram | ORAL | Qty: 1

## 2019-06-04 MED FILL — HEPARIN (PORCINE) 5,000 UNIT/ML IJ SOLN: 5000 unit/mL | INTRAMUSCULAR | Qty: 1

## 2019-06-04 MED FILL — FAMOTIDINE 20 MG TAB: 20 mg | ORAL | Qty: 1

## 2019-06-04 MED FILL — VELTASSA 8.4 GRAM ORAL POWDER PACKET: 8.4 gram | ORAL | Qty: 1

## 2019-06-04 MED FILL — LIDOCAINE 5 % (700 MG/PATCH) ADHESIVE PATCH: 5 % | CUTANEOUS | Qty: 1

## 2019-06-04 MED FILL — FUROSEMIDE 10 MG/ML IJ SOLN: 10 mg/mL | INTRAMUSCULAR | Qty: 4

## 2019-06-04 MED FILL — MUCINEX 600 MG TABLET, EXTENDED RELEASE: 600 mg | ORAL | Qty: 1

## 2019-06-04 MED FILL — IPRATROPIUM-ALBUTEROL 2.5 MG-0.5 MG/3 ML NEB SOLUTION: 2.5 mg-0.5 mg/3 ml | RESPIRATORY_TRACT | Qty: 3

## 2019-06-04 MED FILL — PREDNISONE 20 MG TAB: 20 mg | ORAL | Qty: 2

## 2019-06-04 MED FILL — BUTALBITAL-ACETAMINOPHEN-CAFFEINE 50 MG-325 MG-40 MG TAB: 50-325-40 mg | ORAL | Qty: 1

## 2019-06-04 MED FILL — INSULIN LISPRO 100 UNIT/ML INJECTION: 100 unit/mL | SUBCUTANEOUS | Qty: 1

## 2019-06-04 MED FILL — METHOCARBAMOL 500 MG TAB: 500 mg | ORAL | Qty: 1

## 2019-06-04 MED FILL — GABAPENTIN 300 MG CAP: 300 mg | ORAL | Qty: 1

## 2019-06-04 MED FILL — SOLU-MEDROL (PF) 40 MG/ML SOLUTION FOR INJECTION: 40 mg/mL | INTRAMUSCULAR | Qty: 1

## 2019-06-04 MED FILL — CEPHALEXIN 500 MG CAP: 500 mg | ORAL | Qty: 1

## 2019-06-04 MED FILL — AZITHROMYCIN 250 MG TAB: 250 mg | ORAL | Qty: 2

## 2019-06-04 MED FILL — DILTIAZEM ER 240 MG 24 HR CAP: 240 mg | ORAL | Qty: 1

## 2019-06-04 MED FILL — MONTELUKAST 10 MG TAB: 10 mg | ORAL | Qty: 1

## 2019-06-04 MED FILL — ROSUVASTATIN 5 MG TAB: 5 mg | ORAL | Qty: 1

## 2019-06-04 MED FILL — MAGNESIUM OXIDE 400 MG TAB: 400 mg | ORAL | Qty: 1

## 2019-06-04 MED FILL — IBUPROFEN 400 MG TAB: 400 mg | ORAL | Qty: 2

## 2019-06-04 NOTE — Progress Notes (Signed)
Problem: Falls - Risk of  Goal: *Absence of Falls  Description: Document Schmid Fall Risk and appropriate interventions in the flowsheet.  Outcome: Progressing Towards Goal  Note: Fall Risk Interventions:  Mobility Interventions: Bed/chair exit alarm, Patient to call before getting OOB, Utilize walker, cane, or other assistive device         Medication Interventions: Bed/chair exit alarm, Evaluate medications/consider consulting pharmacy, Patient to call before getting OOB, Teach patient to arise slowly    Elimination Interventions: Bed/chair exit alarm, Call light in reach, Patient to call for help with toileting needs              Problem: Patient Education: Go to Patient Education Activity  Goal: Patient/Family Education  Outcome: Progressing Towards Goal     Problem: Gas Exchange - Impaired  Goal: *Absence of hypoxia  Outcome: Progressing Towards Goal

## 2019-06-04 NOTE — Progress Notes (Signed)
Problem: Falls - Risk of  Goal: *Absence of Falls  Description: Document Schmid Fall Risk and appropriate interventions in the flowsheet.  Outcome: Progressing Towards Goal  Note: Fall Risk Interventions:  Mobility Interventions: Bed/chair exit alarm         Medication Interventions: Assess postural VS orthostatic hypotension    Elimination Interventions: Call light in reach

## 2019-06-04 NOTE — Progress Notes (Signed)
PHYSICAL THERAPY TREATMENT:     Patient: [name] [name] (68 y.o. female)  Room: 2212/2212    Primary Diagnosis: Hypoxia [R09.02]  COPD exacerbation ([place]) [J44.1]  UTI (urinary tract infection) [N39.0]       Length of Stay:  3 day(s)   Insurance: Payor: [employer] MEDICARE / Plan: [place] HMO / Product Type: Managed Care Medicare /      Date: 06/04/2019  In time: 10:32  Out time:  10:57     Precautions: Falls.      Isolation:  There are currently no Active Isolations       MDRO: No active infections     Equipment: rolling walker      Assessment:      Based on the objective data described below, the patient presents with  - impaired functional mobility  - impaired LE strength  - increased pain  - impaired balance  - impaired activity tolerance  - Fair+ motivation to participate in PT to return to prior level of function  - progressing well towards PT goals  - Sit<->stand Supervised  - Ambulated 30' Supervised with RW.  Seated rest break than an additional 20' with RW    Recommendations:  Recommend continued physical therapy during acute stay.  . Recommend out of bed activity to counteract ill effects of bedrest, with assistance from staff as needed.  Discharge Recommendations: home health PT, vs STR pending progress and O2 requirements.  Further Equipment Recommendations for Discharge: home O2.     Plan:      Patient will be followed by physical therapy to address goals per initial plan of care.    Subjective:      Patient "I just worked with you, oh wait was that OT?"  Patient reports 5/10 pain before treatment and 5/10 pain at conclusion of treatment in L hip    Objective Data Summary:      Orders, labs, and chart reviewed on [name] [name]. Communicated with patient's nurse.    Patient found:     Supine in bed.    Cognitive Status:     Mental Status: Alert and oriented x 3.    Safety/Judgement: good safety awareness.    Therapeutic Activities; Functional Mobility and Balance Status:       Focus on functional mobility training, gait training, therapeutic activities, patient education. Provided cueing for hand placement, technique, and safety.      Bed mobility:    Functional Status   Comments   Supine to sit supervision, requires increased time, HOB elevated     Sit to supine supervision, pt assisting RLE with BUE    Scooting supervision        Transfers:    Functional Status   Comments   Sit to stand supervision    Stand to sit supervision       Ambulation/Gait Training:     Distance  30 feet seated rest break then 15 feet, seated rest break on commode then 15 feet back to EOB    Analysis  slow, steady reciprocal gait pattern, no LOB. Pt reporting fatigue and SOB with ambulation      Assistance  supervision and cueing for breathing technique and energy conservation and activity pacing    DME  rolling walker, oxygen nasal cannula     Stair training  not tested, pt has chair lift to 2nd floor at home        Functional Balance  Sitting  Static sitting   Normal static: patient able to maintain steady balance without handhold support     Dynamic sitting   Good dynamic: patient accepts moderate challenge      Functional Balance Standing     Static standing   good (accepts mod challenge, able to pick object off floor)    Dynamic standing   Fair+       Activity Tolerance:     patient with decreased tolerance to sustained activity, requires oxygen, patient complaint of fatigue with activity     O2 sat 97% on 2L at rest  O2 sat 92% on 2L with ambulation.  Pt reporting fatigue and SOB requiring rest breaks    Final Location:     Positioned in bed, all needs within reach. Patient agrees to call for assistance.    Communication/Education:     Education: OOB activity as tolerated, OOB to chair for meals and mobilize as tolerated, activity pacing, energy conservation.    Education provided to: patient   Opportunity for questions and clarification was provided.     Readiness to learn indicated by: verbalized understanding    Barriers to learning/limitations:  None    Comprehension: Patient communicated comprehension and In agreement      Thank you for this referral.  Gwendolyn Fill, PT, DPT

## 2019-06-04 NOTE — Progress Notes (Addendum)
Hospitalist Progress Note    Patient: [name] [name]               Sex: female          DOA: 06/01/2019       Date of Birth:  1950-09-05      Age:  68 y.o.        LOS:  LOS: 3 days     PCP: [name], PA-C   Assessment and Plan:    Acute COPD exacerbation  Acute hypoxic respiratory failure due to COPD exacerbation, obesity hypoventilation and possible diastolic CHF, OSA.  Left lingular lobe pneumonia  Hypovolemic hyponatremia  Left hip pain  Urinary??tract infection with urinary retention  Insulin-dependent diabetes mellitus type 2 uncontrolled with hyperglycemia  Peripheral neuropathy  Hyperlipidemia  Hypertension  History of tobacco abuse  Bullous emphysema  Chronic pain syndrome  GERD  Known 2 cm left lower lobe nodule    Plan  Continue supplemental oxygen, wean off as tolerated.  Currently on 2 L via nasal cannula  Patient had large urine output, looks congested.  One-time Lasix 40 mg IV ordered.  Obtain echocardiogram, likely provoked diastolic heart failure, obesity hypoventilation syndrome.  Patient needs outpatient sleep study for possible obstructive sleep apnea, patient meets lack of sleep and daytime sleepiness, headache  Continue supportive therapy for headache  Patient has been on antibiotic IV Rocephin, Zithromax switch to oral regimen.  Strep, Legionella antigen negative no fever  COVID-19 negative on admission  Urine culture contaminated, covered with antibiotic  Monitor with bladder check for urine retention  Out of bed to chair.  IV steroids switch to oral prednisone today.  PT OT evaluation appreciated.  Continue gabapentin for peripheral neuropathy.,  Switch muscle relaxant to as needed dosing  Glucomander insulin regimen, uncontrolled blood sugars, likely due to steroid-induced hyperglycemia  Continue to hold losartan, low Aldactone, add one-time Veltassa  Monitor renal function, potassium levels.  Discontinue morphine.  Continue Breo, nebulizer treatment around-the-clock   Labs not available for this a.m.    Orthopedic evaluated, plan for outpatient evaluation.  Patient to follow-up with Dr. Caryl Ada in 1 to 2 weeks after discharge.   incentive spirometry DVT prophylaxis    Disposition likely in 1 to 2 days Home versus rehab  ??  Subjective: Resting in bed, complains headache, wheezing     Ms. Brunelle is a 68 y.o. year old female who is being seen for hypoxic respiratory failure    Objective:      Vital Signs:  Visit Vitals  BP (!) 133/93 (BP 1 Location: Right arm, BP Patient Position: Supine)   Pulse 85   Temp 97.3 ??F (36.3 ??C)   Resp 20   Ht 5\' 7"  (1.702 m)   Wt 100.7 kg (222 lb 0.1 oz)   SpO2 97%   BMI 34.77 kg/m??     Physical Exam:  GENERAL: Alert and oriented times 3.  Morbidly obese  HEAD: Head normocephalic, atraumatic  Eyes: Conjunctivae pink, sclera anicteric  CARDIOVASCULAR: S1 and S2 heard, no murmur  RESPIRATORY: Effort normal.  Bilateral rhonchi  ABDOMEN: Soft, nontender, nondistended.   NEUROLOGIC: Alert and oriented times 3. No focal weakness  Extremity: trace edema  PSYCHIATRIC: Normal affect.    Intake and Output:  Last three shifts:  12/20 1901 - 12/22 0700  In: 360 [P.O.:360]  Out: 4300 [Urine:4300]    Lab Results:  Recent Results (from the past 24 hour(s))   METABOLIC PANEL, BASIC  Collection Time: 06/03/19  9:07 AM   Result Value Ref Range    Sodium 135 (L) 136 - 145 mEq/L    Potassium 5.0 3.5 - 5.1 mEq/L    Chloride 101 98 - 107 mEq/L    CO2 27 21 - 32 mEq/L    Glucose 404 (HH) 74 - 106 mg/dl    BUN 28 (H) 7 - 25 mg/dl    Creatinine 1.2 0.6 - 1.3 mg/dl    GFR est AA 40.958.0      GFR est non-AA 48      Calcium 8.9 8.5 - 10.1 mg/dl    Anion gap 8 5 - 15 mmol/L   GLUCOSE, POC    Collection Time: 06/03/19 11:08 AM   Result Value Ref Range    Glucose (POC) 347 (H) 65 - 105 mg/dL   GLUCOSE, POC    Collection Time: 06/03/19  4:45 PM   Result Value Ref Range    Glucose (POC) 375 (H) 65 - 105 mg/dL   GLUCOSE, POC    Collection Time: 06/03/19  9:44 PM    Result Value Ref Range    Glucose (POC) 352 (H) 65 - 105 mg/dL   GLUCOSE, POC    Collection Time: 06/04/19  7:55 AM   Result Value Ref Range    Glucose (POC) 279 (H) 65 - 105 mg/dL      Medications:  Current Facility-Administered Medications   Medication Dose Route Frequency   ??? patiromer calcium sorbitex (VELTASSA) powder 8.4 g  8.4 g Oral NOW   ??? methylPREDNISolone (PF) (SOLU-MEDROL) injection 40 mg  40 mg IntraVENous Q12H   ??? ibuprofen (MOTRIN) tablet 800 mg  800 mg Oral TID   ??? famotidine (PEPCID) tablet 20 mg  20 mg Oral BID   ??? lidocaine (LIDODERM) 5 % patch 1 Patch  1 Patch TransDERmal Q24H   ??? dextrose (D50) infusion 5-25 g  10-50 mL IntraVENous PRN   ??? glucagon (GLUCAGEN) injection 1 mg  1 mg IntraMUSCular PRN   ??? insulin glargine (LANTUS) injection 1-100 Units  1-100 Units SubCUTAneous QHS   ??? insulin lispro (HUMALOG) injection 1-100 Units  1-100 Units SubCUTAneous AC&HS   ??? albuterol-ipratropium (DUO-NEB) 2.5 MG-0.5 MG/3 ML  3 mL Nebulization Q6H RT   ??? cefTRIAXone (ROCEPHIN) 1 g in 0.9% sodium chloride (MBP/ADV) 50 mL MBP  1 g IntraVENous Q24H   ??? cloNIDine HCL (CATAPRES) tablet 0.3 mg  0.3 mg Oral BID   ??? dilTIAZem ER (CARDIZEM CD) capsule 240 mg  240 mg Oral DAILY   ??? gabapentin (NEURONTIN) capsule 300 mg  300 mg Oral TID   ??? guaiFENesin ER (MUCINEX) tablet 600 mg  600 mg Oral Q12H   ??? [Held by provider] losartan (COZAAR) tablet 50 mg  50 mg Oral DAILY   ??? magnesium oxide (MAG-OX) tablet 400 mg  400 mg Oral DAILY   ??? montelukast (SINGULAIR) tablet 10 mg  10 mg Oral QHS   ??? QUEtiapine SR (SEROquel XR) tablet 150 mg  150 mg Oral QHS PRN   ??? [Held by provider] spironolactone (ALDACTONE) tablet 100 mg  100 mg Oral DAILY   ??? rosuvastatin (CRESTOR) tablet 5 mg  5 mg Oral QHS   ??? tiotropium bromide (SPIRIVA RESPIMAT) 2.5 mcg /actuation  1 Puff Inhalation DAILY   ??? traZODone (DESYREL) tablet 50 mg  50 mg Oral QHS PRN   ??? sodium chloride (NS) flush 5-10 mL  5-10 mL IntraVENous Q8H    ???  sodium chloride (NS) flush 5-10 mL  5-10 mL IntraVENous PRN   ??? naloxone (NARCAN) injection 0.1 mg  0.1 mg IntraVENous PRN   ??? acetaminophen (TYLENOL) tablet 650 mg  650 mg Oral Q6H PRN   ??? heparin (porcine) injection 5,000 Units  5,000 Units SubCUTAneous Q12H   ??? azithromycin (ZITHROMAX) 500 mg in 0.9% sodium chloride 250 mL IVPB  500 mg IntraVENous Q24H   ??? polyethylene glycol (MIRALAX) packet 17 g  17 g Oral DAILY PRN   ??? methocarbamoL (ROBAXIN) tablet 500 mg  500 mg Oral QID   ??? butalbital-acetaminophen-caffeine (FIORICET, ESGIC) 50-325-40 mg per tablet 1 Tab  1 Tab Oral Q4H PRN   ??? fluticasone-vilanterol (BREO ELLIPTA) 110mcg-25mcg/puff  1 Puff Inhalation DAILY     Dragon dictation software was used for portions of this report. Unintended errors in transcription may occur.  Madaline Savage, MD  June 04, 2019  8:33 AM

## 2019-06-04 NOTE — Progress Notes (Signed)
OCCUPATIONAL THERAPY EVALUATION     Patient: [name] [name] (68 y.o. female)  Room: 2212/2212    Primary Diagnosis: Hypoxia [R09.02]  COPD exacerbation ([place]) [J44.1]  UTI (urinary tract infection) [N39.0]       Date of Admission: 06/01/2019   Length of Stay:  3 day(s)  Insurance: Payor: [employer] MEDICARE / Plan: [place] HMO / Product Type: Managed Care Medicare /      Date: 06/04/2019  In time:  10:07        Out time:  10:30    Precautions:Falls   Ordered Weight Bearing Status: NA    Isolation:  There are currently no Active Isolations       MDRO: No active infections    ** O2 sats ranging between 92-96% during session, patient remained on 2L O2 throughout session**    ASSESSMENT :   Based on the objective data described below, the patient presents with   - generalized muscle weakness affecting function in ADLs  - decreased functional standing balance    - decreased functional mobility   - unsteady in functional mobility, transfers and standing  - decreased standing tolerance  - decreased activity tolerance, increased fatigue and/or shortness of breath  - decreased tolerance to sustained activity  - deconditioning  - increased pain in L groin/hip   affecting patient's safety and independence/ability to perform basic ADLs/IADLs    Patient will benefit from skilled occupational therapy intervention to address the above impairments.    Patient???s rehabilitation potential is considered to be Good for below stated goals.     Recommendations:  Recommend continued occupational therapy during acute stay. Recommend out of bed activity to counteract ill effects of bedrest, with assistance from staff as needed.  Discharge Recommendations: Home with HHOT/PT and family support verses SNF , pending progress   Further Equipment Recommendations for Discharge: patient owns RW, WC, tub transfer bench, straight cane, nebulizer, and stair lift. May need home O2.     Plan:    Patient will be followed by occupational therapy to address goals while hospitalized as patient's status and schedule permit.   Patient to be seen 1-5x/week x 2 weeks.    Planned interventions may include any combination of the following: ADI training, activity tolerance, functional balance training, functional mobility training, therapeutic exercise, therapeutic activity, patient/caregiver education and training and energy conservation    Patient and/or family have participated as able in goal setting and plan of care.  Occupational Therapy goals:     OT goals initiated 06/04/2019 and will be met by patient within 10 sessions     - Patient will perform all aspects of toileting with independence and DME/AE PRN.  - Patient will perform Upper Body Bathing with stand-by assistance and DME/AE PRN.  - Patient will perform Lower Body Bathing with stand-by assistance and DME/AE PRN  - Patient will participate in 10 minute static/dynamic standing ADL at sink level with contact guard assistance and DME PRN.  - Patient will navigate private room in preparation for toileting with supervision and DME PRN.  - Patient will perform functional bed mobility in preparation for EOB ADL task with independence.  - Patient will perform sit to stand in preparation for toileting with independence and DME PRN.  - Patient will navigate a community distance (>50 feet) to address community mobility with contact guard assistance and DME PRN.     Education/ communication:     Barriers to Learning/Limitations:  yes;  physical  Education provided  to: patient on (+) role of OT, (+) OT plan of care, (+) instructed patient on the importance of activity while hospitalized to prevent a decline in function, (+) home safety, bathroom safety, functional mobility, (+) ADL training, (+) discharge disposition  Educational Handouts issued: none this session  Patient / Family readiness to learn indicated by: verbalized understanding    SUBJECTIVE:      Patient "My husband likes to do everything for me at home"     Pain Assessment: location: L groin/hip    OBJECTIVE DATA SUMMARY:     Orders, labs, and chart reviewed on Bren[facility] Sontag. Communicated with Naaman Plummer, Therapist, sports. Cleared for OT evaluation.     Patient was admitted to the hospital on 06/01/2019 with   Chief Complaint   Patient presents with   ??? Hip Pain   ??? Groin Pain     Present illness history:   Patient Active Problem List    Diagnosis Date Noted   ??? UTI (urinary tract infection) 06/01/2019   ??? Hypoxia 06/01/2019   ??? COPD exacerbation ([place]) 05/07/2019   ??? Acute hypoxemic respiratory failure ([place]) 05/07/2019   ??? Respiratory failure with hypoxia ([place]) 08/22/2018   ??? Headache 08/05/2018   ??? Abdominal pain 02/04/2018   ??? SIRS (systemic inflammatory response syndrome) ([place]) 10/09/2017   ??? Hyperkalemia 09/08/2017   ??? Obtunded 09/08/2017   ??? Acute renal failure (ARF) ([place]) 09/08/2017   ??? Septic shock (HCC) 16/03/9603   ??? Metabolic encephalopathy 54/02/8118   ??? Dehydration 08/07/2017   ??? COPD with acute exacerbation ([place]) 08/07/2017   ??? Acute-on-chronic kidney injury ([place]) 08/07/2017   ??? Chest pain 06/15/2017   ??? Dyspnea 06/15/2017   ??? Type 2 diabetes mellitus with diabetic neuropathy ([place]) 12/07/2016   ??? 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 Cannabinoid hyperemesis syndrome 08/17/2016   ??? Gastritis 08/16/2016   ??? Nausea & vomiting 08/16/2016   ??? Asthma with acute exacerbation 08/04/2016   ??? Lactic acidosis 07/24/2016   ??? Leukocytosis 07/24/2016   ??? Sepsis ([place]) 07/24/2016   ??? Asthma exacerbation 07/24/2016   ??? Type 2 diabetes mellitus with nephropathy ([place]) 06/12/2016   ??? Sacroiliitis ([place]) 11/25/2015   ??? Essential hypertension 10/06/2015   ??? 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 Lumbar and sacral osteoarthritis 09/15/2015   ??? Chronic pain syndrome 09/15/2015   ??? Type 2 diabetes mellitus with hyperglycemia, without long-term current use of insulin ([place]) 08/20/2015    ??? Acute colitis 07/02/2015   ??? Acute hyperglycemia 07/02/2015   ??? Accelerated hypertension 04/08/2015   ??? Severe headache 04/07/2015   ??? 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 PTSD (post-traumatic stress disorder) 01/29/2015   ??? Severe hypertension 07/21/2014      Previous medical history:   Past Medical History:   Diagnosis Date   ??? Arthritis    ??? 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 DM2 (diabetes mellitus, type 2) ([place])    ??? GERD (gastroesophageal reflux disease)    ??? Hepatitis C     HEP C   ??? HTN (hypertension)    ??? Lung nodule, multiple     (CT 10/2016) Small left upper lobe and 1.7 x 1.6 cm left lower lobe nodules not significantly changed from 07/23/2016 and 03/22/2016   ??? Marijuana use    ???  PTSD (post-traumatic stress disorder)     lived through the [place] in 1993   ??? Thoracic ascending aortic aneurysm ([place])     4.4cm noted on CT Chest (10/2016)   ??? Thyroid nodule     2.5 cm stable right thyroid nodule (CT 10/2016)       Patient found:     Bed, (+) bed/chair exit alarm, (+) oxygen, (+) pure wick urine system    Patient received / participated in 15 minutes of treatment (functional mobility retraining, functional balance retraining, ADI retraining, activity tolerance) and/or educational instruction during/immediately following OT evaluation    Prior level of function / living situation status:     Information was obtained by:   patient and physical therapy evaluation  Home environment:   Patient lives with her husband in a 2 level home with chair lift to second floor. Tub shower with tub transfer bench..     Prior level of function:Patient is independent in basic ADLS.  Patient ambulates with rolling walker.  Prior level of Instrumental Activities of Daily Living:     Patient does not perform any IADLs.   Husband performs all IADL's   Home equipment: transfer tub bench, stair lift, rolling walker, cane, nebulizer     Cognitive status:     Mental status:   Orientation: Patient is oriented to person,place, month and year, Neurostate: awake, alert and pleasant  Communication: normal  Attention Span:   good (>42mn)  Follows commands: intact; consistently follows commands  Safety/Judgement: appropriate safety awareness, appropriate insight into deficits    Activities of daily living status:   Based on direct observation, simulation and clinical assessment.      Eating:           - not seen at meal time, but demonstrates WFLs UE ROM and strength to perform self feeding  Grooming:     - independent, stand-by assistance, set-up  UB bathing:   - stand-by assistance, minimum assistance, set-up  LB bathing:   - stand-by assistance, minimum assistance, set-up  UB dressing: - stand-by assistance, minimum assistance, set-up  LB dressing: - supervision, stand-by assistance, set-up  Toileting:       - supervision, stand-by assistance    Comment(s):   Treatment:   - Patient donned gown while seated EOB "like a robe" with Minimal Assistance, set-up.  - Patient demonstrates flexibility to reach BLE's for LB dressing/bathing with Supervision while seated EOB.   - [facility] with [place]using [place]W this date.    Functional mobility status:     Mobility:  Supine to sit -  supervision, standby assistance  Sit to supine -  supervision, standby assistance  Sit to stand -  standby assistance, contact guard assistance, rolling walker  Stand to sit -  standby assistance, contact guard assistance, rolling walker     Transfers:  Bed to commode: contact guard assistance, rolling walker, oxygen (2L)    Comment(s):   Treatment:   - Patient navigated approximately 20 feet within private room in preparation for toileting with CGA using RW.  - Patient with labored breathing once seated on BSC, O2 sats at 94%.      Functional balance status:      Static Sitting Balance -           good:       maintains balance against moderate resistance  Dynamic Sitting Balance -      good:  independent with functional dynamic balance activities   Static Standing Balance -       fair (+):    maintains balance with independence without cueing  Dynamic Standing Balance -  fair:          able to perform full UE ROM ranges with CGA/SBA    Activity Tolerance: requires oxygen, requires rest breaks, observed SOB with activity    Comment(s):   -  Patient was instructed in purse lip breathing.      Upper extremity status:     Dominance:right  RIGHT: ACTIVE range of motion is WNL.  Strength is grossly graded as 4+/5: (Completes full range of motion against gravity with moderate to maximal resistance).  Comment: no c/o paresthesia  LEFT:   ACTIVE range of motion is WNL.  Strength is grossly graded as 4+/5: (Completes full range of motion against gravity with moderate to maximal resistance).  Comment: no c/o paresthesia    Final location:     Positioned in bed, all needs within reach, agrees to call for assistance, (+) bed/chair alarm.    Thank you for this referral.    Milus Banister, OT  June 04, 2019  Pager: [phone]

## 2019-06-04 NOTE — Progress Notes (Signed)
Problem: Gas Exchange - Impaired  Goal: *Absence of hypoxia  Outcome: Progressing Towards Goal     Problem: Patient Education: Go to Patient Education Activity  Goal: Patient/Family Education  Outcome: Progressing Towards Goal

## 2019-06-04 NOTE — Progress Notes (Signed)
Problem: Gas Exchange - Impaired  Goal: *Absence of hypoxia  Outcome: Progressing Towards Goal     Problem: Patient Education: Go to Patient Education Activity  Goal: Patient/Family Education  Outcome: Progressing Towards Goal
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
## 2019-06-04 NOTE — Progress Notes (Signed)
Progress Notes by Madaline Savage, MD at 06/04/19 [phone]                Author: Madaline Savage, MD  Service: Hospitalist  Author Type: Physician       Filed: 06/04/19 0907  Date of Service: 06/04/19 0833  Status: Addendum          Editor: Madaline Savage, MD (Physician)          Related Notes: Original Note by Madaline Savage, MD (Physician) filed at 06/04/19 [phone]                       Hospitalist Progress Note      Patient: [name] [name]               Sex: female          DOA: 06/01/2019         Date of Birth:  Oct 20, 1950      Age:  68 y.o.        LOS:  LOS: 3 days       PCP: [name], PA-C      Assessment and Plan:      Acute COPD exacerbation   Acute hypoxic respiratory failure due to COPD exacerbation, obesity hypoventilation and possible diastolic CHF, OSA.   Left lingular lobe pneumonia   Hypovolemic hyponatremia   Left hip pain   Urinary??tract infection with urinary retention   Insulin-dependent diabetes mellitus type 2 uncontrolled with hyperglycemia   Peripheral neuropathy   Hyperlipidemia   Hypertension   History of tobacco abuse   Bullous emphysema   Chronic pain syndrome   GERD   Known 2 cm left lower lobe nodule      Plan   Continue supplemental oxygen, wean off as tolerated.  Currently on 2 L via nasal cannula   Patient had large urine output, looks congested.  One-time Lasix 40 mg IV ordered.   Obtain echocardiogram, likely provoked diastolic heart failure, obesity hypoventilation syndrome.  Patient needs outpatient sleep study for possible obstructive sleep apnea, patient meets lack of sleep and daytime sleepiness, headache   Continue supportive therapy for headache   Patient has been on antibiotic IV Rocephin, Zithromax switch to oral regimen.  Strep, Legionella antigen negative no fever   COVID-19 negative on admission   Urine culture contaminated, covered with antibiotic   Monitor with bladder check for urine retention   Out of bed to chair.   IV steroids  switch to oral prednisone today.   PT OT evaluation appreciated.   Continue gabapentin for peripheral neuropathy.,  Switch muscle relaxant to as needed dosing   Glucomander insulin regimen, uncontrolled blood sugars, likely due to steroid-induced hyperglycemia   Continue to hold losartan, low Aldactone, add one-time Veltassa   Monitor renal function, potassium levels.   Discontinue morphine.   Continue Breo, nebulizer treatment around-the-clock   Labs not available for this a.m.      Orthopedic evaluated, plan for outpatient evaluation.  Patient to follow-up with Dr. Caryl Ada in 1 to 2 weeks after discharge.    incentive spirometry DVT prophylaxis      Disposition likely in 1 to 2 days Home versus rehab   ??     Subjective: Resting in bed, complains headache, wheezing        [name] [name] is a  68 y.o. year old female who is being seen for hypoxic respiratory failure        Objective:  Vital Signs:   Visit Vitals      BP  (!) 133/93 (BP 1 Location: Right arm, BP Patient Position: Supine)     Pulse  85     Temp  97.3 ??F (36.3 ??C)     Resp  20     Ht   (1.702 m)     Wt  100.7 kg (222 lb 0.1 oz)     SpO2  97%        BMI  34.77 kg/m??        Physical Exam:   GENERAL: Alert and oriented times 3.  Morbidly obese   HEAD: Head normocephalic, atraumatic   Eyes: Conjunctivae pink, sclera anicteric   CARDIOVASCULAR: S1 and S2 heard, no murmur   RESPIRATORY: Effort normal.  Bilateral rhonchi   ABDOMEN: Soft, nontender, nondistended.    NEUROLOGIC: Alert and oriented times 3. No focal weakness   Extremity: trace edema   PSYCHIATRIC: Normal affect.      Intake and Output:   Last three shifts:  12/20 1901 - 12/22 0700   In: 360 [P.O.:360]   Out: 4300 [Urine:4300]      Lab Results:     Recent Results (from the past 24 hour(s))     METABOLIC PANEL, BASIC          Collection Time: 06/03/19  9:07 AM         Result  Value  Ref Range            Sodium  135 (L)  136 - 145 mEq/L       Potassium  5.0  3.5 - 5.1 mEq/L             Chloride  101  98 - 107 mEq/L            CO2  27  21 - 32 mEq/L       Glucose  404 (HH)  74 - 106 mg/dl       BUN  28 (H)  7 - 25 mg/dl       Creatinine  1.2  0.6 - 1.3 mg/dl       GFR est AA  16.1          GFR est non-AA  48          Calcium  8.9  8.5 - 10.1 mg/dl       Anion gap  8  5 - 15 mmol/L       GLUCOSE, POC          Collection Time: 06/03/19 11:08 AM         Result  Value  Ref Range            Glucose (POC)  347 (H)  65 - 105 mg/dL       GLUCOSE, POC          Collection Time: 06/03/19  4:45 PM         Result  Value  Ref Range            Glucose (POC)  375 (H)  65 - 105 mg/dL       GLUCOSE, POC          Collection Time: 06/03/19  9:44 PM         Result  Value  Ref Range            Glucose (POC)  352 (H)  65 - 105 mg/dL  GLUCOSE, POC          Collection Time: 06/04/19  7:55 AM         Result  Value  Ref Range            Glucose (POC)  279 (H)  65 - 105 mg/dL         Medications:     Current Facility-Administered Medications          Medication  Dose  Route  Frequency           ?  patiromer calcium sorbitex (VELTASSA) powder 8.4 g   8.4 g  Oral  NOW     ?  methylPREDNISolone (PF) (SOLU-MEDROL) injection 40 mg   40 mg  IntraVENous  Q12H     ?  ibuprofen (MOTRIN) tablet 800 mg   800 mg  Oral  TID     ?  famotidine (PEPCID) tablet 20 mg   20 mg  Oral  BID     ?  lidocaine (LIDODERM) 5 % patch 1 Patch   1 Patch  TransDERmal  Q24H     ?  dextrose (D50) infusion 5-25 g   10-50 mL  IntraVENous  PRN           ?  glucagon (GLUCAGEN) injection 1 mg   1 mg  IntraMUSCular  PRN           ?  insulin glargine (LANTUS) injection 1-100 Units   1-100 Units  SubCUTAneous  QHS     ?  insulin lispro (HUMALOG) injection 1-100 Units   1-100 Units  SubCUTAneous  AC&HS     ?  albuterol-ipratropium (DUO-NEB) 2.5 MG-0.5 MG/3 ML   3 mL  Nebulization  Q6H RT     ?  cefTRIAXone (ROCEPHIN) 1 g in 0.9% sodium chloride (MBP/ADV) 50 mL MBP   1 g  IntraVENous  Q24H     ?  cloNIDine HCL (CATAPRES) tablet 0.3 mg   0.3 mg  Oral  BID     ?   dilTIAZem ER (CARDIZEM CD) capsule 240 mg   240 mg  Oral  DAILY     ?  gabapentin (NEURONTIN) capsule 300 mg   300 mg  Oral  TID     ?  guaiFENesin ER (MUCINEX) tablet 600 mg   600 mg  Oral  Q12H     ?  [Held by provider] losartan (COZAAR) tablet 50 mg   50 mg  Oral  DAILY     ?  magnesium oxide (MAG-OX) tablet 400 mg   400 mg  Oral  DAILY     ?  montelukast (SINGULAIR) tablet 10 mg   10 mg  Oral  QHS     ?  QUEtiapine SR (SEROquel XR) tablet 150 mg   150 mg  Oral  QHS PRN     ?  [Held by provider] spironolactone (ALDACTONE) tablet 100 mg   100 mg  Oral  DAILY     ?  rosuvastatin (CRESTOR) tablet 5 mg   5 mg  Oral  QHS     ?  tiotropium bromide (SPIRIVA RESPIMAT) 2.5 mcg /actuation   1 Puff  Inhalation  DAILY     ?  traZODone (DESYREL) tablet 50 mg   50 mg  Oral  QHS PRN     ?  sodium chloride (NS) flush 5-10 mL   5-10 mL  IntraVENous  Q8H           ?  sodium chloride (NS) flush 5-10 mL   5-10 mL  IntraVENous  PRN           ?  naloxone (NARCAN) injection 0.1 mg   0.1 mg  IntraVENous  PRN     ?  acetaminophen (TYLENOL) tablet 650 mg   650 mg  Oral  Q6H PRN     ?  heparin (porcine) injection 5,000 Units   5,000 Units  SubCUTAneous  Q12H     ?  azithromycin (ZITHROMAX) 500 mg in 0.9% sodium chloride 250 mL IVPB   500 mg  IntraVENous  Q24H     ?  polyethylene glycol (MIRALAX) packet 17 g   17 g  Oral  DAILY PRN     ?  methocarbamoL (ROBAXIN) tablet 500 mg   500 mg  Oral  QID     ?  butalbital-acetaminophen-caffeine (FIORICET, ESGIC) 50-325-40 mg per tablet 1 Tab   1 Tab  Oral  Q4H PRN           ?  fluticasone-vilanterol (BREO ELLIPTA) 15100mcg-25mcg/puff   1 Puff  Inhalation  DAILY        Dragon dictation software was used for portions of this report. Unintended errors in transcription may occur.   Madaline SavageKulasegaram Evone Arseneau, MD   June 04, 2019   8:33 AM

## 2019-06-04 NOTE — Progress Notes (Signed)
 PHYSICAL THERAPY TREATMENT:     Patient: [name] [name] (68 y.o. female)  Room: 2212/2212    Primary Diagnosis: Hypoxia [R09.02]  COPD exacerbation (HCC) [J44.1]  UTI (urinary tract infection) [N39.0]       Length of Stay:  3 day(s)   Insurance: Payor: HUMANA MEDICARE / Plan: [facility] HU[employer] MEDICARE HMO / Product Type: Managed Care Medicare /      Date: 06/04/2019  In time: 10:32  Out time:  10:57     Precautions: Falls.      Isolation:  There are currently no Active Isolations       MDRO: No active infections     Equipment: rolling walker      Assessment:      Based on the objective data described below, the patient presents with  - impaired functional mobility  - impaired LE strength  - increased pain  - impaired balance  - impaired activity tolerance  - Fair+ motivation to participate in PT to return to prior level of function  - progressing well towards PT goals  - Sit<->stand Supervised  - Ambulated 30' Supervised with RW.  Seated rest break than an additional 20' with RW    Recommendations:  Recommend continued physical therapy during acute stay.  . Recommend out of bed activity to counteract ill effects of bedrest, with assistance from staff as needed.  Discharge Recommendations: home health PT, vs STR pending progress and O2 requirements.  Further Equipment Recommendations for Discharge: home O2.     Plan:      Patient will be followed by physical therapy to address goals per initial plan of care.    Subjective:      Patient I just worked with you, oh wait was that OT?  Patient reports 5/10 pain before treatment and 5/10 pain at conclusion of treatment in L hip    Objective Data Summary:      Orders, labs, and chart reviewed on Taelyn Ratto. Communicated with patient's nurse.    Patient found:     Supine in bed.    Cognitive Status:     Mental Status: Alert and oriented x 3.    Safety/Judgement: good safety awareness.    Therapeutic Activities; Functional Mobility and Balance Status:      Focus on functional  mobility training, gait training, therapeutic activities, patient education. Provided cueing for hand placement, technique, and safety.      Bed mobility:    Functional Status   Comments   Supine to sit supervision, requires increased time, HOB elevated     Sit to supine supervision, pt assisting RLE with BUE    Scooting supervision        Transfers:    Functional Status   Comments   Sit to stand supervision    Stand to sit supervision       Ambulation/Gait Training:     Distance  30 feet seated rest break then 15 feet, seated rest break on commode then 15 feet back to EOB    Analysis  slow, steady reciprocal gait pattern, no LOB. Pt reporting fatigue and SOB with ambulation      Assistance  supervision and cueing for breathing technique and energy conservation and activity pacing    DME  rolling walker, oxygen nasal cannula     Stair training  not tested, pt has chair lift to 2nd floor at home        Functional Balance  Sitting  Static sitting   Normal static: patient able to maintain steady balance without handhold support     Dynamic sitting   Good dynamic: patient accepts moderate challenge      Functional Balance Standing     Static standing   good (accepts mod challenge, able to pick object off floor)    Dynamic standing   Fair+       Activity Tolerance:     patient with decreased tolerance to sustained activity, requires oxygen, patient complaint of fatigue with activity     O2 sat 97% on 2L at rest  O2 sat 92% on 2L with ambulation.  Pt reporting fatigue and SOB requiring rest breaks    Final Location:     Positioned in bed, all needs within reach. Patient agrees to call for assistance.    Communication/Education:     Education: OOB activity as tolerated, OOB to chair for meals and mobilize as tolerated, activity pacing, energy conservation.    Education provided to: patient   Opportunity for questions and clarification was provided.    Readiness to learn indicated by: verbalized  understanding    Barriers to learning/limitations:  None    Comprehension: Patient communicated comprehension and In agreement      Thank you for this referral.  Clent DELENA Serve, PT, DPT

## 2019-06-05 LAB — CBC WITH AUTO DIFFERENTIAL
Basophils %: 0.1 % (ref 0–3)
Eosinophils %: 0 % (ref 0–5)
Hematocrit: 39.3 % (ref 37.0–50.0)
Hemoglobin: 12.7 gm/dl — ABNORMAL LOW (ref 13.0–17.2)
Immature Granulocytes: 0.9 % (ref 0.0–3.0)
Lymphocytes %: 13.5 % — ABNORMAL LOW (ref 28–48)
MCH: 29.3 pg (ref 25.4–34.6)
MCHC: 32.3 gm/dl (ref 30.0–36.0)
MCV: 90.6 fL (ref 80.0–98.0)
MPV: 11.9 fL — ABNORMAL HIGH (ref 6.0–10.0)
Monocytes %: 13.5 % — ABNORMAL HIGH (ref 1–13)
Neutrophils %: 72 % — ABNORMAL HIGH (ref 34–64)
Nucleated RBCs: 0 (ref 0–0)
Platelets: 191 10*3/uL (ref 140–450)
RBC: 4.34 M/uL (ref 3.60–5.20)
RDW-SD: 43.7 (ref 36.4–46.3)
WBC: 9.6 10*3/uL (ref 4.0–11.0)

## 2019-06-05 LAB — BASIC METABOLIC PANEL
Anion Gap: 4 mmol/L — ABNORMAL LOW (ref 5–15)
BUN: 36 mg/dl — ABNORMAL HIGH (ref 7–25)
CO2: 32 mEq/L (ref 21–32)
Calcium: 9.2 mg/dl (ref 8.5–10.1)
Chloride: 100 mEq/L (ref 98–107)
Creatinine: 1.1 mg/dl (ref 0.6–1.3)
EGFR IF NonAfrican American: 53
GFR African American: >60
Glucose: 288 mg/dl — ABNORMAL HIGH (ref 74–106)
Potassium: 4.3 mEq/L (ref 3.5–5.1)
Sodium: 136 mEq/L (ref 136–145)

## 2019-06-05 LAB — MAGNESIUM
Magnesium: 2.1 mg/dL (ref 1.6–2.6)
Magnesium: 2.1 mg/dl (ref 1.6–2.6)

## 2019-06-05 LAB — POCT GLUCOSE
POC Glucose: 331 mg/dL — ABNORMAL HIGH (ref 65–105)
POC Glucose: 280 mg/dL — ABNORMAL HIGH (ref 65–105)

## 2019-06-05 LAB — METABOLIC PANEL, BASIC
Anion gap: 4 mmol/L — ABNORMAL LOW (ref 5–15)
BUN: 36 mg/dl — ABNORMAL HIGH (ref 7–25)
CO2: 32 mEq/L (ref 21–32)
Calcium: 9.2 mg/dl (ref 8.5–10.1)
Chloride: 100 mEq/L (ref 98–107)
Creatinine: 1.1 mg/dl (ref 0.6–1.3)
GFR est AA: >60
GFR est non-AA: 53
Glucose: 288 mg/dl — ABNORMAL HIGH (ref 74–106)
Potassium: 4.3 mEq/L (ref 3.5–5.1)
Sodium: 136 mEq/L (ref 136–145)

## 2019-06-05 LAB — CBC WITH AUTOMATED DIFF
BASOPHILS: 0.1 % (ref 0–3)
EOSINOPHILS: 0 % (ref 0–5)
HCT: 39.3 % (ref 37.0–50.0)
HGB: 12.7 gm/dl — ABNORMAL LOW (ref 13.0–17.2)
IMMATURE GRANULOCYTES: 0.9 % (ref 0.0–3.0)
LYMPHOCYTES: 13.5 % — ABNORMAL LOW (ref 28–48)
MCH: 29.3 pg (ref 25.4–34.6)
MCHC: 32.3 gm/dl (ref 30.0–36.0)
MCV: 90.6 fL (ref 80.0–98.0)
MONOCYTES: 13.5 % — ABNORMAL HIGH (ref 1–13)
MPV: 11.9 fL — ABNORMAL HIGH (ref 6.0–10.0)
NEUTROPHILS: 72 % — ABNORMAL HIGH (ref 34–64)
NRBC: 0 (ref 0–0)
PLATELET: 191 10*3/uL (ref 140–450)
RBC: 4.34 M/uL (ref 3.60–5.20)
RDW-SD: 43.7 (ref 36.4–46.3)
WBC: 9.6 10*3/uL (ref 4.0–11.0)

## 2019-06-05 LAB — GLUCOSE, POC
Glucose (POC): 331 mg/dL — ABNORMAL HIGH (ref 65–105)
Glucose (POC): 280 mg/dL — ABNORMAL HIGH (ref 65–105)

## 2019-06-05 MED ORDER — FUROSEMIDE 20 MG TAB
20 mg | ORAL_TABLET | Freq: Two times a day (BID) | ORAL | 0 refills | Status: DC
Start: 2019-06-05 — End: 2019-07-05

## 2019-06-05 MED ORDER — PREDNISONE 10 MG TAB
10 mg | ORAL_TABLET | ORAL | 0 refills | Status: DC
Start: 2019-06-05 — End: 2019-06-05

## 2019-06-05 MED ORDER — PREDNISONE 10 MG TAB
10 mg | ORAL_TABLET | ORAL | 0 refills | Status: DC
Start: 2019-06-05 — End: 2019-07-05

## 2019-06-05 MED FILL — IPRATROPIUM-ALBUTEROL 2.5 MG-0.5 MG/3 ML NEB SOLUTION: 2.5 mg-0.5 mg/3 ml | RESPIRATORY_TRACT | Qty: 3

## 2019-06-05 MED FILL — MUCINEX 600 MG TABLET, EXTENDED RELEASE: 600 mg | ORAL | Qty: 1

## 2019-06-05 MED FILL — BUTALBITAL-ACETAMINOPHEN-CAFFEINE 50 MG-325 MG-40 MG TAB: 50-325-40 mg | ORAL | Qty: 1

## 2019-06-05 MED FILL — CEPHALEXIN 500 MG CAP: 500 mg | ORAL | Qty: 1

## 2019-06-05 MED FILL — POLYETHYLENE GLYCOL 3350 17 GRAM (100 %) ORAL POWDER PACKET: 17 gram | ORAL | Qty: 1

## 2019-06-05 MED FILL — AZITHROMYCIN 250 MG TAB: 250 mg | ORAL | Qty: 2

## 2019-06-05 MED FILL — DILTIAZEM ER 240 MG 24 HR CAP: 240 mg | ORAL | Qty: 1

## 2019-06-05 MED FILL — FAMOTIDINE 20 MG TAB: 20 mg | ORAL | Qty: 1

## 2019-06-05 MED FILL — GABAPENTIN 300 MG CAP: 300 mg | ORAL | Qty: 1

## 2019-06-05 MED FILL — CLONIDINE 0.1 MG TAB: 0.1 mg | ORAL | Qty: 1

## 2019-06-05 MED FILL — SODIUM CHLORIDE 0.9 % IJ SYRG: INTRAMUSCULAR | Qty: 10

## 2019-06-05 MED FILL — ROSUVASTATIN 5 MG TAB: 5 mg | ORAL | Qty: 1

## 2019-06-05 MED FILL — HEPARIN (PORCINE) 5,000 UNIT/ML IJ SOLN: 5000 unit/mL | INTRAMUSCULAR | Qty: 1

## 2019-06-05 MED FILL — METHOCARBAMOL 500 MG TAB: 500 mg | ORAL | Qty: 1

## 2019-06-05 MED FILL — PREDNISONE 20 MG TAB: 20 mg | ORAL | Qty: 2

## 2019-06-05 MED FILL — MONTELUKAST 10 MG TAB: 10 mg | ORAL | Qty: 1

## 2019-06-05 MED FILL — MAGNESIUM OXIDE 400 MG TAB: 400 mg | ORAL | Qty: 1

## 2019-06-05 NOTE — Discharge Summary (Addendum)
DISCHARGE SUMMARY     Patient Name: [name] [name]  Medical Record Number: [id number]  Date of Birth: Dec 29, 1950  Discharge Provider: Edger House, MD  Primary Care Provider: Wallene Huh, PA-C  Admit date: 06/01/2019  Discharge date: 06/05/2019  Discharge Disposition: Home  Code Status: Full code  Follow-up appointments:     Follow-up Information     Follow up With Specialties Details Why Contact Info    Wallene Huh, PA-C Physician Assistant Schedule an appointment as soon as possible for a visit in 1 week pt to call to schedule follow-up with PCP per office.  [address]  [address]  [place] [address]  [phone]      Orene Desanctis, MD Orthopedic Surgery, Orthopedic Surgery Schedule an appointment as soon as possible for a visit in 1 week patient to call to schedule appointment for worsened left hip pain.  [address]  [place] [address]  [phone]          Follow-up recommendations:   Follow-up with primary care physician in 1 week's time.  Check CBC, BMP in 1 week's time.  Patient developed hyperkalemia hence Aldactone discontinued and switched to Lasix.  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  Patient was continued on nebulizer treatment, IV steroids, supplemental oxygen.  Patient was given IV diuresis.  Patient developed marginal hyperkalemia hence Aldactone discontinued.   Patient complained of left hip pain.  Evaluated by orthopedic surgery.  Recommended follow-up with Dr. Orene Desanctis outpatient.  Patient's pain brought under control with NSAIDs, monitoring renal function which remained stable.  Patient was treated with antibiotic for lingular lobe pneumonia.  Strep, Legionella antigens negative, COVID-19 negative.  Likely patient has combination of obesity hypoventilation, obstructive sleep apnea which needs outpatient work-up.  She had home oxygen evaluation and qualified.    ??                Past Medical History:   Diagnosis Date   ??? Arthritis    ??? 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 DM2 (diabetes mellitus, type 2) ([place])    ??? GERD (gastroesophageal reflux disease)    ??? Hepatitis C     HEP C   ??? HTN (hypertension)    ??? Lung nodule, multiple     (CT 10/2016) Small left upper lobe and 1.7 x 1.6 cm left lower lobe nodules not significantly changed from 07/23/2016 and 03/22/2016   ??? Marijuana use    ??? PTSD (post-traumatic stress disorder)     lived through the [place] in 1993   ??? Thoracic ascending aortic aneurysm ([place])     4.4cm noted on CT Chest (10/2016)   ??? Thyroid nodule     2.5 cm stable right thyroid nodule (CT 10/2016)  Discharge Medications:                 Discharge Medication List as of 06/05/2019 10:55 AM      START taking these medications    Details   furosemide (Lasix) 20 mg tablet Take 1 Tab by mouth two (2) times a day for 30 days., Normal, Disp-60 Tab, R-0      predniSONE (DELTASONE) 10 mg tablet 40 mg twice a day for 3 days then 40 mg daily for 3 days then 20 mg daily for 3 days then 20 mg daily for 3 days then stop, Normal, Disp-45 Tab, R-0         CONTINUE these medications which have NOT CHANGED    Details    methocarbamoL (Robaxin) 500 mg tablet Take 1 Tab by mouth four (4) times daily., Print, Disp-20 Tab,R-0      ibuprofen (MOTRIN) 600 mg tablet Take 1 Tab by mouth every six (6) hours as needed for Pain., Print, Disp-20 Tab,R-0      butalbital-acetaminophen-caff (FIORICET) 50-300-40 mg per capsule Take 1 Cap by mouth every four (4) hours as needed for Headache., Normal, Disp-30 Cap,R-0      dilTIAZem CD (CARDIZEM CD) 240 mg ER capsule Take 1 Cap by mouth daily., Normal, Disp-30 Cap, R-1      losartan (COZAAR) 50 mg tablet Take 1 Tab by mouth daily., Normal, Disp-30 Tab, R-1      cetirizine (ZYRTEC) 10 mg tablet Take 1 Tab by mouth daily., Normal, Disp-30 Tab, R-1      fluticasone propionate (FLONASE) 50 mcg/actuation nasal spray 2 Sprays by Both Nostrils route daily., Normal, Disp-1 Bottle, R-1      guaiFENesin ER (MUCINEX) 600 mg ER tablet Take 1 Tab by mouth every twelve (12) hours., Normal, Disp-20 Tab, R-0      LANTUS SOLOSTAR U-100 INSULIN 100 unit/mL (3 mL) inpn INJECT 32 UNITS SUBCUTANEOUSLY  NIGHTLY, Normal, Disp-15 mL, R-0      albuterol (ACCUNEB) 1.25 mg/3 mL nebu USE 1 VIAL IN NEBULIZER EVERY 6 HOURS AS NEEDED FOR  SHORTNESS  OF  BREATH,  WHEEZING, Normal, Disp-270 mL, R-1      cloNIDine HCl (CATAPRES) 0.1 mg tablet Take 3 Tabs by mouth two (2) times a day., Normal, Disp-60 Tab, R-1      metFORMIN ER (GLUCOPHAGE XR) 500 mg tablet TAKE 1 TABLET TWICE DAILY, Normal, Disp-180 Tab, R-1      montelukast (SINGULAIR) 10 mg tablet TAKE 1 TABLET EVERY DAY, Normal, Disp-90 Tab, R-3      gabapentin (NEURONTIN) 300 mg capsule Take 1 Cap by mouth three (3) times daily. Max Daily Amount: 900 mg., Print, Disp-270 Cap, R-1      polyethylene glycol (MIRALAX) 17 gram packet Take 1 Packet by mouth daily., Normal, Disp-30 Packet, R-1      magnesium oxide (MAG-OX) 400 mg tablet Take 400 mg by mouth daily., Historical Med      QUEtiapine SR (SEROQUEL XR) 150 mg sr tablet Take 150 mg by mouth nightly as needed., Historical Med       tiotropium bromide (SPIRIVA RESPIMAT) 2.5 mcg/actuation inhaler Take 1 Puff by inhalation daily., Normal, Disp-1 Inhaler, R-0      fluticasone propion-salmeterol (ADVAIR DISKUS) 100-50 mcg/dose diskus inhaler Take 1 Puff by inhalation two (2) times a day., Normal, Disp-1 Inhaler, R-0      traZODone (DESYREL) 50 mg tablet Take 1 Tab by mouth nightly as needed for Sleep., Normal, Disp-20 Tab, R-0  rosuvastatin (CRESTOR) 5 mg tablet TAKE 1 TABLET BY MOUTH NIGHTLY, Normal, Disp-90 Tab, R-1         STOP taking these medications       spironolactone (ALDACTONE) 100 mg tablet Comments:   Reason for Stopping:             Discharge Diet:            Diet: Cardiac Diet    History of presenting illness  [name] [name] is a 68 y.o. year old female extensive past medical history including COPD with bullous emphysema, hypertension, insulin-dependent diabetes, hyperlipidemia, peripheral neuropathy, history of tobacco abuse, known pulmonary nodule, chronic pain syndrome and GERD who presents from home today with concern of left groin pain that is dull aching throbbing in nature that she thinks is coming from her hip but she has known arthritis she also endorses coughing, chest tightness and occasional shortness of breath with exertion has been worsening for the last few days.  Of note patient was just hospitalized here from November 24 November 30 for COPD exacerbation during which time she required supplemental oxygen, frequent breathing treatments and IV steroids, she was also treated for an acute kidney injury and she also had mentioned abdominal pain which is apparently chronic.  Denies any constipation or diarrhea, no blood in her bowel movements.  No vomiting that she knows of.  No fever or chills and no Covid contacts that she is aware of.   ER course: Patient somewhat hypoxic 88 to 90% on room air started on 2 L nasal cannula with improvement to 93%.  Was somewhat tachypneic and wheezing given steroids and DuoNeb therapy with mild improvement, white blood cell count elevated 11.2 metabolic work-up reveals normal creatinine today improved from last admission.  CT of the abdomen and pelvis done due to the concern of left groin/left lower quadrant abdominal pain which revealed sigmoid diverticula without evidence of diverticulitis, there is also note of bibasilar bullous disease in her lungs which is not new.  Urinalysis was done which revealed 1+ bacteria small leukocyte Estrace and she had approximately 250 cc post void residual on bladder scan.  Patient was started on antibiotic therapy for urinary tract infection with urine culture with ceftriaxone.  I added azithromycin after I requested chest x-ray which reveals lingular infiltrate.  Covid swab pending.  ??  Physical exam:   Visit Vitals  BP 112/69 (BP 1 Location: Right arm, BP Patient Position: Supine)   Pulse 93   Temp 98.2 ??F (36.8 ??C)   Resp 20   Ht 5\' 7"  (1.702 m)   Wt 100.7 kg (222 lb 0.1 oz)   SpO2 97%   BMI 34.77 kg/m??     General appearance: alert, cooperative, morbidly obese, pleasant  Head: Normocephalic, atraumatic  Eyes: negative  Lungs: clear to auscultation bilaterally  Heart: regular rate and rhythm, S1, S2 normal, no murmur  Abdomen: soft, non-tender. Bowel sounds normal.   Extremities:no cyanosis or edema    Diagnostic Results:  Xr Chest Sngl V    Result Date: 06/01/2019  IMPRESSION: 1. Known 2 cm left lower lobe nodule. 2. Associated 3.6 cm opacity superiorly possibly lung consolidation. 3. Lingular infiltrate.     Ct Abd Pelv W Cont    Result Date: 06/01/2019  IMPRESSION: 1. Multiple sigmoid diverticula without diverticulitis. 2. 2 mm nonobstructing right kidney stone. 3. Total right hip replacement. 4. Hysterectomy.     All Micro Results      Procedure Component Value  Units Date/Time    CULTURE, URINE [[id number]]  (Abnormal) Collected: 06/01/19 1145    Order Status: Completed Specimen: Cath Urine Updated: 06/03/19 0817     Culture result --        >100,000 CFU/mL  Skin and/or genital contamination      SARS-COV-2_FLU [[id number]] Collected: 06/01/19 1445    Order Status: Completed Specimen: NASOPHARYNGEAL SWAB Updated: 06/01/19 1813     COVID-19 NEGATIVE        Comment: Negative results do not preclude SARS-CoV-2 infection and should not be used as the sole basis  for treatment or other patient management decisions. Negative results must be combined with  clinical observations, patient history, and epidemiological information.  Testing with the Xpert Xpress SARS-CoV-2 test is intended for use by trained operators who are  proficient in performing tests using either GeneXpert Dx, GeneXpert Infinity and/or BlueLinxeneXpert  Xpress systems. The Xpert Xpress SARS-CoV-2 test is only for use under the Food and Drug  Administration's Emergency Use Authorization.          Influenza A PCR NEGATIVE        Influenza B PCR NEGATIVE       S.Oretha MilchNEUMO AG, UR/CSF [[id number]] Collected: 06/01/19 1445    Order Status: Completed Specimen: Urine Updated: 06/01/19 1758     Strep pneumo Ag, urine NEGATIVE        Comment: Presumptive negative for pneumococcal pneumonia, suggesting no current or recent pneumococcal  infection.  Infection due to S. pneumoniae cannot be ruled out since the antigen present in the  sample may be below the detection limit of the test.         LEGIONELLA PNEUMOPHILA AG, URINE [[id number]] Collected: 06/01/19 1445    Order Status: Completed Specimen: Urine Updated: 06/01/19 1758     Legionella Ag, urine NEGATIVE        Comment: Presumptive negative for L. pneumophila serogroup 1 antigen in urine, suggesting no recent or  current infection. Infection due to Legionella cannot be ruled out since other serogroups and   species may cause disease, antigen may not be present in urine in early infection and the level  of antigen present in the urine may be below the detection limit of the test.         CULTURE, RESPIRATORY/SPUTUM/BRONCH [name] GRAM STAIN [[id number]]     Order Status: No result Specimen: Sputum         Discharge condition:  improved  Discharge activity and restrictions: PT/OT per [facility]    Lennox GrumblesBrock, Monique, PA-C,  thank you for allowing us to participate in the care of this patient.     Total time spent was 38  minutes of which greater than 50% spent on counseling and coordination of care.  Madaline SavageKulasegaram Royalty Domagala, MD  06/06/2019  10:23 AM

## 2019-06-05 NOTE — Progress Notes (Signed)
Patient discharged home with family assistance and home health ([facility]).  Oxygen ordered to be delivered to the room with [facility].  Spouse, Bobby Goodwin, to pick up patient for discharge.  Anastasia/Bonnie/Juanita with [facility] notified of referral.  Face-to-face completed and waiting on physician signature.      Discharge Plan:   Home with family assistance and home health    Discharge Date:     06/05/2019     Assisted Living Facility:    N/A    Is patient going to snf?:  No     Home Health Needed:     Yes - skilled nursing    Home Health Agency:    [facility]    FOC given:  Yes     Confirmed start of care with the home health agency and spoke with:      [facility] email    DME needed and ordered for Discharge:    Yes - oxygen    DME Company:    [facility]    CM confirmed delivery of DME: with      Wade Holmes    TCC Referral:     No    Medication Assistance given   No       meds given (please list)     None    Change(MDC/SSDI) Referral/outcome     N/A     Transportation: Family/ Medical    Family

## 2019-06-05 NOTE — Other (Addendum)
----------  DocumentID: [id number]------------------------------------------------              [facility]                       Patient Education Report         Name: [name] [name], [name] [name]                  Date: 06/01/2019    MRN: [id number]                    Time: 8:31:22 PM         Patient ordered video: 'Patient Safety: Stay Safe While you are in the Hospital'    from 2WST_2212_1 via phone number: [phone] at 8:31:22 PM    Description: This program outlines some of the precautions patients can take to ensure a speedy recovery without extra complications. The video emphasizes the importance of communicating with the healthcare team.    ----------DocumentID: [id number]------------------------------------------------                       [facility]          Patient Education Report - Discharge Summary        Date: 06/05/2019   Time: 3:40:16 PM   Name: [name] [name], [name] [name]   MRN: [id number]      Account Number: [id number]      Education History:        Patient ordered video: 'Patient Safety: Stay Safe While you are in the Hospital' from 2WST_2212_1 on 06/01/2019 08:31:22 PM

## 2019-06-05 NOTE — Progress Notes (Signed)
Two IVs removed. Discharge instructions provided and reviewed with patient.

## 2019-06-05 NOTE — Progress Notes (Signed)
06/05/19 1030 06/05/19 1038 06/05/19 1044   RT Walking Oximetry   Stage Resting (Room Air) Resting (on O2) During Walk (on O2)   SpO2 (!) 86 % 90 % 97 %   HR 84 bpm 56 bpm 125 bpm   Rate of Dyspnea 0 0 1   O2 Device  --  Nasal cannula None (Room air)   O2 Flow Rate (L/min)  --  2 l/min 2 l/min

## 2019-06-05 NOTE — Progress Notes (Signed)
Problem: Falls - Risk of  Goal: *Absence of Falls  Description: Document [name] [name] Fall Risk and appropriate interventions in the flowsheet.  Outcome: Resolved/Met  Note: Fall Risk Interventions:  Mobility Interventions: Bed/chair exit alarm, Communicate number of staff needed for ambulation/transfer, Patient to call before getting OOB, PT Consult for mobility concerns, PT Consult for assist device competence    Mentation Interventions: Adequate sleep, hydration, pain control, Bed/chair exit alarm, Evaluate medications/consider consulting pharmacy, Increase mobility, Toileting rounds, Update white board    Medication Interventions: Bed/chair exit alarm, Evaluate medications/consider consulting pharmacy, Patient to call before getting OOB, Teach patient to arise slowly    Elimination Interventions: Bed/chair exit alarm, Call light in reach, Patient to call for help with toileting needs, Toileting schedule/hourly rounds              Problem: Patient Education: Go to Patient Education Activity  Goal: Patient/Family Education  Outcome: Resolved/Met     Problem: Artist - Impaired  Goal: *Absence of hypoxia  Outcome: Resolved/Met     Problem: Patient Education: Go to Patient Education Activity  Goal: Patient/Family Education  Outcome: Resolved/Met     Problem: Chronic Obstructive Pulmonary Disease (COPD)  Goal: *Oxygen saturation during activity within specified parameters  Outcome: Resolved/Met  Goal: *Able to remain out of bed as prescribed  Outcome: Resolved/Met  Goal: *Absence of hypoxia  Outcome: Resolved/Met  Goal: *Optimize nutritional status  Outcome: Resolved/Met     Problem: Patient Education: Go to Patient Education Activity  Goal: Patient/Family Education  Outcome: Resolved/Met     Problem: Pressure Injury - Risk of  Goal: *Prevention of pressure injury  Description: Document Braden Scale and appropriate interventions in the flowsheet.  Outcome: Resolved/Met  Note: Pressure Injury Interventions:        Moisture Interventions: Internal/External urinary devices, Maintain skin hydration (lotion/cream), Minimize layers, Moisture barrier    Activity Interventions: Increase time out of bed, Pressure redistribution bed/mattress(bed type), PT/OT evaluation    Mobility Interventions: HOB 30 degrees or less, Pressure redistribution bed/mattress (bed type), PT/OT evaluation    Nutrition Interventions: Document food/fluid/supplement intake                     Problem: Patient Education: Go to Patient Education Activity  Goal: Patient/Family Education  Outcome: Resolved/Met

## 2019-06-05 NOTE — Progress Notes (Signed)
Echo completed

## 2019-06-05 NOTE — Progress Notes (Signed)
Problem: Gas Exchange - Impaired  Goal: *Absence of hypoxia  Outcome: Progressing Towards Goal

## 2019-06-05 NOTE — Progress Notes (Signed)
Echo completed

## 2019-06-05 NOTE — Progress Notes (Signed)
Problem: Falls - Risk of  Goal: *Absence of Falls  Description: Document [name] [name] Fall Risk and appropriate interventions in the flowsheet.  Outcome: Resolved/Met  Note: Fall Risk Interventions:  Mobility Interventions: Bed/chair exit alarm, Communicate number of staff needed for ambulation/transfer, Patient to call before getting OOB, PT Consult for mobility concerns, PT Consult for assist device competence    Mentation Interventions: Adequate sleep, hydration, pain control, Bed/chair exit alarm, Evaluate medications/consider consulting pharmacy, Increase mobility, Toileting rounds, Update white board    Medication Interventions: Bed/chair exit alarm, Evaluate medications/consider consulting pharmacy, Patient to call before getting OOB, Teach patient to arise slowly    Elimination Interventions: Bed/chair exit alarm, Call light in reach, Patient to call for help with toileting needs, Toileting schedule/hourly rounds              Problem: Patient Education: Go to Patient Education Activity  Goal: Patient/Family Education  Outcome: Resolved/Met     Problem: Journalist, newspaper - Impaired  Goal: *Absence of hypoxia  Outcome: Resolved/Met     Problem: Patient Education: Go to Patient Education Activity  Goal: Patient/Family Education  Outcome: Resolved/Met     Problem: Chronic Obstructive Pulmonary Disease (COPD)  Goal: *Oxygen saturation during activity within specified parameters  Outcome: Resolved/Met  Goal: *Able to remain out of bed as prescribed  Outcome: Resolved/Met  Goal: *Absence of hypoxia  Outcome: Resolved/Met  Goal: *Optimize nutritional status  Outcome: Resolved/Met     Problem: Patient Education: Go to Patient Education Activity  Goal: Patient/Family Education  Outcome: Resolved/Met     Problem: Pressure Injury - Risk of  Goal: *Prevention of pressure injury  Description: Document Braden Scale and appropriate interventions in the flowsheet.  Outcome: Resolved/Met  Note: Pressure Injury Interventions:        Moisture Interventions: Internal/External urinary devices, Maintain skin hydration (lotion/cream), Minimize layers, Moisture barrier    Activity Interventions: Increase time out of bed, Pressure redistribution bed/mattress(bed type), PT/OT evaluation    Mobility Interventions: HOB 30 degrees or less, Pressure redistribution bed/mattress (bed type), PT/OT evaluation    Nutrition Interventions: Document food/fluid/supplement intake                     Problem: Patient Education: Go to Patient Education Activity  Goal: Patient/Family Education  Outcome: Resolved/Met

## 2019-06-05 NOTE — Progress Notes (Signed)
06/05/19 1030 06/05/19 1038 06/05/19 1044   RT Walking Oximetry   Stage Resting (Room Air) Resting (on O2) During Walk (on O2)   SpO2 (!) 86 % 90 % 97 %   HR 84 bpm 56 bpm 125 bpm   Rate of Dyspnea 0 0 1   O2 Device  --  Nasal cannula None (Room air)   O2 Flow Rate (L/min)  --  2 l/min 2 l/min

## 2019-06-05 NOTE — Progress Notes (Signed)
Patient discharged home with family assistance and home health ([facility]).  Oxygen ordered to be delivered to the room with [facility].  Spouse, Heywood Iles, to pick up patient for discharge.  Anastasia/Bonnie/Juanita with [facility] notified of referral.  Face-to-face completed and waiting on physician signature.      Discharge Plan:   Home with family assistance and home health    Discharge Date:     06/05/2019     Assisted Living Facility:    N/A    Is patient going to snf?:  No     Home Health Needed:     Yes - skilled nursing    Home Health Agency:    [facility]    FOC given:  Yes     Confirmed start of care with the home health agency and spoke with:      [facility] email    DME needed and ordered for Discharge:    Yes - oxygen    DME Company:    [employer]    CM confirmed delivery of DME: with      Peter Garter    [facility]CC Referral:     No    Medication Assistance given   No       meds given (please list)     None    Change(MDC/SSDI) Referral/outcome     N/A     Transportation: Family/ Medical    Family

## 2019-06-05 NOTE — Progress Notes (Signed)
Problem: Gas Exchange - Impaired  Goal: *Absence of hypoxia  Outcome: Progressing Towards Goal

## 2019-06-05 NOTE — Discharge Summary (Signed)
Discharge Summary by Madaline Savage, MD at 06/05/19 1023                Author: Madaline Savage, MD  Service: Hospitalist  Author Type: Physician       Filed: 06/06/19 1259  Date of Service: 06/05/19 1023  Status: Addendum          Editor: Madaline Savage, MD (Physician)          Related Notes: Original Note by Madaline Savage, MD (Physician) filed at 06/06/19 1259                                                                                                  DISCHARGE SUMMARY       Patient Name: [name] [name]   Medical Record Number: [id number]   Date of Birth: 05/22/1951   Discharge Provider: Madaline Savage, MD   Primary Care Provider: [name], PA-C   Admit date: 06/01/2019   Discharge date: 06/05/2019   Discharge Disposition: Home   Code Status: Full code   Follow-up appointments:         Follow-up Information               Follow up With  Specialties  Details  Why  Contact Info              [name], PA-C  Physician Assistant  Schedule an appointment as soon as possible for a visit in 1 week  pt to call to schedule follow-up with PCP per office.   [address]   [address]   [place] [address]   [phone]                 Caryl Ada, MD  Orthopedic Surgery, Orthopedic Surgery  Schedule an appointment as soon as possible for a visit in 1 week  patient to call to schedule appointment for worsened left hip pain.   [address]   [place] [address]   [phone]                Follow-up recommendations:    Follow-up with primary care physician in 1 week's time.  Check CBC, BMP in 1 week's time.   Patient developed hyperkalemia hence Aldactone discontinued and switched to Lasix.   Discharge diagnosis:   Acute hypoxic respiratory failure   Acute COPD exacerbation   Morbid obesity   Left hip pain   Left lingular lobe pneumonia   Urinary??tract infection with urinary retention   Insulin-dependent diabetes mellitus type 2 uncontrolled with  hyperglycemia   Peripheral neuropathy   Hyperlipidemia   Hypertension   History of tobacco abuse   Bullous emphysema   Chronic pain syndrome   GERD   Known 2 cm left lower lobe nodule      Hospital course:   Patient was admitted with difficulty breathing with acute COPD exacerbation.  Patient was continued on nebulizer treatment, IV steroids, supplemental oxygen.  Patient was given IV diuresis.  Patient developed marginal hyperkalemia hence Aldactone discontinued.   Patient complained of left hip pain.  Evaluated by orthopedic  surgery.  Recommended follow-up with Dr. Orene Desanctis outpatient.   Patient's pain brought under control with NSAIDs, monitoring renal function which remained stable.   Patient was treated with antibiotic for lingular lobe pneumonia.  Strep, Legionella antigens negative, COVID-19 negative.   Likely patient has combination of obesity hypoventilation, obstructive sleep apnea which needs outpatient work-up.   She had home oxygen evaluation and qualified.      ??                    Past Medical History:        Diagnosis  Date         ?  Arthritis       ?  Chronic pain syndrome            related to R hip replacement         ?  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  DM2 (diabetes mellitus, type 2) ([place]       ?  GERD (gastroesophageal reflux disease)       ?  Hepatitis C            HEP C         ?  HTN (hypertension)       ?  Lung nodule, multiple            (CT 10/2016) Small left upper lobe and 1.7 x 1.6 cm left lower lobe nodules not significantly changed from 07/23/2016 and 03/22/2016         ?  Marijuana use       ?  PTSD (post-traumatic stress disorder)            lived through the [place] in 1993         ?  Thoracic ascending aortic aneurysm ([place])            4.4cm noted on CT Chest (10/2016)         ?  Thyroid nodule            2.5 cm stable right thyroid nodule (CT 10/2016)                                                             Discharge Medications:                     Discharge Medication List as of 06/05/2019 10:55 AM              START taking these medications          Details        furosemide (Lasix) 20 mg tablet  Take 1 Tab by mouth two (2) times a day for 30 days., Normal, Disp-60 Tab, R-0               predniSONE (DELTASONE) 10 mg tablet  40 mg twice a day for 3 days then 40 mg daily for 3 days then 20 mg daily for 3 days then 20 mg daily for 3 days then stop, Normal, Disp-45 Tab, R-0  CONTINUE these medications which have NOT CHANGED          Details        methocarbamoL (Robaxin) 500 mg tablet  Take 1 Tab by mouth four (4) times daily., Print, Disp-20 Tab,R-0               ibuprofen (MOTRIN) 600 mg tablet  Take 1 Tab by mouth every six (6) hours as needed for Pain., Print, Disp-20 Tab,R-0               butalbital-acetaminophen-caff (FIORICET) 50-300-40 mg per capsule  Take 1 Cap by mouth every four (4) hours as needed for Headache., Normal, Disp-30 Cap,R-0               dilTIAZem CD (CARDIZEM CD) 240 mg ER capsule  Take 1 Cap by mouth daily., Normal, Disp-30 Cap, R-1               losartan (COZAAR) 50 mg tablet  Take 1 Tab by mouth daily., Normal, Disp-30 Tab, R-1               cetirizine (ZYRTEC) 10 mg tablet  Take 1 Tab by mouth daily., Normal, Disp-30 Tab, R-1               fluticasone propionate (FLONASE) 50 mcg/actuation nasal spray  2 Sprays by Both Nostrils route daily., Normal, Disp-1 Bottle, R-1               guaiFENesin ER (MUCINEX) 600 mg ER tablet  Take 1 Tab by mouth every twelve (12) hours., Normal, Disp-20 Tab, R-0               LANTUS SOLOSTAR U-100 INSULIN 100 unit/mL (3 mL) inpn  INJECT 32 UNITS SUBCUTANEOUSLY  NIGHTLY, Normal, Disp-15 mL, R-0               albuterol (ACCUNEB) 1.25 mg/3 mL nebu  USE 1 VIAL IN NEBULIZER EVERY 6 HOURS AS NEEDED FOR  SHORTNESS  OF  BREATH,  WHEEZING, Normal, Disp-270 mL, R-1               cloNIDine HCl (CATAPRES) 0.1 mg tablet  Take 3 Tabs  by mouth two (2) times a day., Normal, Disp-60 Tab, R-1               metFORMIN ER (GLUCOPHAGE XR) 500 mg tablet  TAKE 1 TABLET TWICE DAILY, Normal, Disp-180 Tab, R-1               montelukast (SINGULAIR) 10 mg tablet  TAKE 1 TABLET EVERY DAY, Normal, Disp-90 Tab, R-3               gabapentin (NEURONTIN) 300 mg capsule  Take 1 Cap by mouth three (3) times daily. Max Daily Amount: 900 mg., Print, Disp-270 Cap, R-1               polyethylene glycol (MIRALAX) 17 gram packet  Take 1 Packet by mouth daily., Normal, Disp-30 Packet, R-1               magnesium oxide (MAG-OX) 400 mg tablet  Take 400 mg by mouth daily., Historical Med               QUEtiapine SR (SEROQUEL XR) 150 mg sr tablet  Take 150 mg by mouth nightly as needed., Historical Med               tiotropium bromide (SPIRIVA RESPIMAT) 2.5  mcg/actuation inhaler  Take 1 Puff by inhalation daily., Normal, Disp-1 Inhaler, R-0               fluticasone propion-salmeterol (ADVAIR DISKUS) 100-50 mcg/dose diskus inhaler  Take 1 Puff by inhalation two (2) times a day., Normal, Disp-1 Inhaler, R-0               traZODone (DESYREL) 50 mg tablet  Take 1 Tab by mouth nightly as needed for Sleep., Normal, Disp-20 Tab, R-0               rosuvastatin (CRESTOR) 5 mg tablet  TAKE 1 TABLET BY MOUTH NIGHTLY, Normal, Disp-90 Tab, R-1                     STOP taking these medications                  spironolactone (ALDACTONE) 100 mg tablet  Comments:    Reason for Stopping:                          Discharge Diet:             Diet: Cardiac Diet      History of presenting illness   [name] [name] is a 68 y.o. year old female extensive past medical history including COPD with bullous emphysema, hypertension, insulin-dependent diabetes, hyperlipidemia, peripheral  neuropathy, history of tobacco abuse, known pulmonary nodule, chronic pain syndrome and GERD who presents from home today with concern of left groin pain that is dull aching throbbing in nature that she thinks is coming from  her hip but she has known  arthritis she also endorses coughing, chest tightness and occasional shortness of breath with exertion has been worsening for the last few days.  Of note patient was just hospitalized here from November 24 November 30 for COPD exacerbation during which  time she required supplemental oxygen, frequent breathing treatments and IV steroids, she was also treated for an acute kidney injury and she also had mentioned abdominal pain which is apparently chronic.  Denies any constipation or diarrhea, no blood  in her bowel movements.  No vomiting that she knows of.  No fever or chills and no Covid contacts that she is aware of.   ER course: Patient somewhat hypoxic 88 to 90% on room air started on 2 L nasal cannula with improvement to 93%.  Was somewhat tachypneic  and wheezing given steroids and DuoNeb therapy with mild improvement, white blood cell count elevated 11.2 metabolic work-up reveals normal creatinine today improved from last admission.  CT of the abdomen and pelvis done due to the concern of left groin/left  lower quadrant abdominal pain which revealed sigmoid diverticula without evidence of diverticulitis, there is also note of bibasilar bullous disease in her lungs which is not new.  Urinalysis was done which revealed 1+ bacteria small leukocyte Estrace  and she had approximately 250 cc post void residual on bladder scan.  Patient was started on antibiotic therapy for urinary tract infection with urine culture with ceftriaxone.  I added azithromycin after I requested chest x-ray which reveals lingular  infiltrate.  Covid swab pending.   ??   Physical exam:    Visit Vitals      BP  112/69 (BP 1 Location: Right arm, BP Patient Position: Supine)     Pulse  93     Temp  98.2 ??F (36.8 ??C)  Resp  20     Ht  5\' 7"  (1.702 m)     Wt  100.7 kg (222 lb 0.1 oz)     SpO2  97%        BMI  34.77 kg/m??        General appearance: alert, cooperative, morbidly obese, pleasant   Head: Normocephalic,  atraumatic   Eyes: negative   Lungs: clear to auscultation bilaterally   Heart: regular rate and rhythm, S1, S2 normal, no murmur   Abdomen: soft, non-tender. Bowel sounds normal.    Extremities:no cyanosis or edema      Diagnostic Results:   Xr Chest Sngl V      Result Date: 06/01/2019   IMPRESSION: 1. Known 2 cm left lower lobe nodule. 2. Associated 3.6 cm opacity superiorly possibly lung consolidation. 3. Lingular infiltrate.       Ct Abd Pelv W Cont      Result Date: 06/01/2019   IMPRESSION: 1. Multiple sigmoid diverticula without diverticulitis. 2. 2 mm nonobstructing right kidney stone. 3. Total right hip replacement. 4. Hysterectomy.         All Micro Results               Procedure  Component  Value  Units  Date/Time           CULTURE, URINE 06/03/2019  (Abnormal)  Collected: 06/01/19 1145            Order Status: Completed  Specimen: Cath Urine  Updated: 06/03/19 0817                Culture result  --                    >100,000 CFU/mL   Skin and/or genital contamination                [id number] 06/05/19  Collected: 06/01/19 1445            Order Status: Completed  Specimen: NASOPHARYNGEAL SWAB  Updated: 06/01/19 1813                COVID-19  NEGATIVE                  Comment:  Negative results do not preclude SARS-CoV-2 infection and should not be used as the sole basis   for treatment or other patient management decisions. Negative results must be combined with   clinical observations, patient history, and epidemiological information.   Testing with the Xpert Xpress SARS-CoV-2 test is intended for use by trained operators who are   proficient in performing tests using either GeneXpert Dx, GeneXpert Infinity and/or 06/03/19. The Xpert Xpress SARS-CoV-2 test is only for use under the Food and Drug   Administration's Emergency Use Authorization.                             Influenza A PCR  NEGATIVE              Influenza B PCR  NEGATIVE                S.PNEUMO AG, UR/CSF  [facility]  Collected: 06/01/19 1445            Order Status: Completed  Specimen: Urine  Updated: 06/01/19 1758                Strep pneumo Ag, urine  NEGATIVE                  Comment:  Presumptive negative for pneumococcal pneumonia, suggesting no current or recent pneumococcal   infection.  Infection due to S. pneumoniae cannot be ruled out since the antigen present in the   sample may be below the detection limit of the test.                        LEGIONELLA PNEUMOPHILA AG, URINE [[id number]]  Collected: 06/01/19 1445            Order Status: Completed  Specimen: Urine  Updated: 06/01/19 1758                Legionella Ag, urine  NEGATIVE                  Comment:  Presumptive negative for L. pneumophila serogroup 1 antigen in urine, suggesting no recent or   current infection. Infection due to Legionella cannot be ruled out since other serogroups and   species may cause disease, antigen may not be present in urine in early infection and the level   of antigen present in the urine may be below the detection limit of the test.                        CULTURE, RESPIRATORY/SPUTUM/BRONCH Berniece Andreas [[id number]]              Order Status: No result  Specimen: Sputum               Discharge condition:  improved   Discharge activity and restrictions: PT/OT per [facility]      [name], PA-C,  thank you for allowing [place] to participate in the care of this patient.       Total time spent was 38  minutes of which greater than 50% spent on counseling and coordination of care.   Madaline Savage, MD   06/06/2019   10:23 AM

## 2019-06-05 NOTE — Progress Notes (Signed)
Two IVs removed. Discharge instructions provided and reviewed with patient.

## 2019-06-19 ENCOUNTER — Inpatient Hospital Stay: Payer: MEDICARE | Attending: Physician Assistant | Primary: Physician Assistant

## 2019-07-05 ENCOUNTER — Inpatient Hospital Stay
Admit: 2019-07-05 | Discharge: 2019-07-06 | Disposition: A | Discharge status: Home / Self Care | Payer: MEDICARE | Attending: Emergency Medicine

## 2019-07-05 DIAGNOSIS — R1031 Right lower quadrant pain: Secondary | ICD-10-CM

## 2019-07-05 LAB — CBC WITH AUTO DIFFERENTIAL
Basophils %: 0.4 % (ref 0–3)
Eosinophils %: 1.1 % (ref 0–5)
Hematocrit: 41.4 % (ref 37.0–50.0)
Hemoglobin: 14 gm/dl (ref 13.0–17.2)
Immature Granulocytes: 0.4 % (ref 0.0–3.0)
Lymphocytes %: 24.3 % — ABNORMAL LOW (ref 28–48)
MCH: 30.4 pg (ref 25.4–34.6)
MCHC: 33.8 gm/dl (ref 30.0–36.0)
MCV: 89.8 fL (ref 80.0–98.0)
MPV: 10.5 fL — ABNORMAL HIGH (ref 6.0–10.0)
Monocytes %: 6.5 % (ref 1–13)
Neutrophils %: 67.3 % — ABNORMAL HIGH (ref 34–64)
Nucleated RBCs: 0 (ref 0–0)
Platelets: 204 10*3/uL (ref 140–450)
RBC: 4.61 M/uL (ref 3.60–5.20)
RDW-SD: 42.2 (ref 36.4–46.3)
WBC: 8.2 10*3/uL (ref 4.0–11.0)

## 2019-07-05 LAB — CBC WITH AUTOMATED DIFF
BASOPHILS: 0.4 % (ref 0–3)
EOSINOPHILS: 1.1 % (ref 0–5)
HCT: 41.4 % (ref 37.0–50.0)
HGB: 14 gm/dl (ref 13.0–17.2)
IMMATURE GRANULOCYTES: 0.4 % (ref 0.0–3.0)
LYMPHOCYTES: 24.3 % — ABNORMAL LOW (ref 28–48)
MCH: 30.4 pg (ref 25.4–34.6)
MCHC: 33.8 gm/dl (ref 30.0–36.0)
MCV: 89.8 fL (ref 80.0–98.0)
MONOCYTES: 6.5 % (ref 1–13)
MPV: 10.5 fL — ABNORMAL HIGH (ref 6.0–10.0)
NEUTROPHILS: 67.3 % — ABNORMAL HIGH (ref 34–64)
NRBC: 0 (ref 0–0)
PLATELET: 204 10*3/uL (ref 140–450)
RBC: 4.61 M/uL (ref 3.60–5.20)
RDW-SD: 42.2 (ref 36.4–46.3)
WBC: 8.2 10*3/uL (ref 4.0–11.0)

## 2019-07-05 MED ORDER — METHOCARBAMOL 750 MG TAB
750 mg | ORAL | Status: AC
Start: 2019-07-05 — End: 2019-07-05
  Administered 2019-07-05: via ORAL

## 2019-07-05 MED ORDER — KETOROLAC TROMETHAMINE 15 MG/ML INJECTION
15 mg/mL | INTRAMUSCULAR | Status: DC
Start: 2019-07-05 — End: 2019-07-05

## 2019-07-05 MED ORDER — SODIUM CHLORIDE 0.9 % IJ SYRG
Freq: Once | INTRAMUSCULAR | Status: DC
Start: 2019-07-05 — End: 2019-07-06

## 2019-07-05 MED ORDER — OXYCODONE-ACETAMINOPHEN 5 MG-325 MG TAB
5-325 mg | ORAL | Status: AC
Start: 2019-07-05 — End: 2019-07-05
  Administered 2019-07-05: via ORAL

## 2019-07-05 MED ORDER — SODIUM CHLORIDE 0.9% BOLUS IV
0.9 % | INTRAVENOUS | Status: AC
Start: 2019-07-05 — End: 2019-07-05
  Administered 2019-07-05: via INTRAVENOUS

## 2019-07-05 MED FILL — METHOCARBAMOL 750 MG TAB: 750 mg | ORAL | Qty: 1

## 2019-07-05 MED FILL — OXYCODONE-ACETAMINOPHEN 5 MG-325 MG TAB: 5-325 mg | ORAL | Qty: 1

## 2019-07-05 NOTE — ED Triage Notes (Signed)
Pt states she is having L Lower groin pain that started today.  Denies any NVD

## 2019-07-05 NOTE — ED Provider Notes (Signed)
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  Patient states that she was doing okay until this morning when she started with pain in the left groin area that moved to the right groin more when she moves.  Patient states that it is not the first time with this pain and she is supposed to follow with an orthopedic doctor but has not not been able to do it yet.  Yesterday, her primary doctor prescribed gabapentin but she has not picked it up yet.  Patient denies fever, chest pain or shortness of breath, nausea or vomiting, diarrhea or constipation, urinary changes, abnormal vaginal discharge, abdominal pain, back pain, worsening of her chronic tingling sensation on her feet from diabetic neuropathy, trauma, weakness, or other complaints.  Past Medical History:   Diagnosis Date   ??? Arthritis    ??? 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 DM2 (diabetes mellitus, type 2) ([place])    ??? GERD (gastroesophageal reflux disease)    ??? Hepatitis C     HEP C   ??? HTN (hypertension)    ??? Lung nodule, multiple     (CT 10/2016) Small left upper lobe and 1.7 x 1.6 cm left lower lobe nodules not significantly changed from 07/23/2016 and 03/22/2016   ??? Marijuana use    ??? PTSD (post-traumatic stress disorder)     lived through the [place] in 1993   ??? Thoracic ascending aortic aneurysm ([place])     4.4cm noted on CT Chest (10/2016)   ??? Thyroid nodule     2.5 cm stable right thyroid nodule (CT 10/2016)       Past Surgical History:   Procedure Laterality Date   ??? HX ENDOSCOPY     ??? HX HERNIA REPAIR  2019    x2   ??? HX HIP REPLACEMENT Right 01/29/2015   ??? HX HIP REPLACEMENT     ??? HX HYSTERECTOMY  2010    hysterectomy          Family History:   Problem Relation Age of Onset   ??? Hypertension Mother    ??? Other Mother         OSA   ??? Cancer Father         suspected leukemia per pt's spouse   ??? Cancer Maternal Aunt    ??? Alcohol abuse Maternal Grandfather        Social History     Socioeconomic History   ??? Marital status: MARRIED     Spouse name: Not on file   ??? Number of children: Not on file   ??? Years of education: Not on file   ??? Highest education level: Not on file   Occupational History   ??? Not on file   Social Needs   ??? Financial resource strain: Not on file   ??? Food insecurity     Worry: Not on file     Inability: Not on file   ??? Transportation needs     Medical: Not on file     Non-medical: Not on file   Tobacco Use   ??? Smoking status: Former Smoker   ??? Smokeless tobacco:  Never Used   ??? Tobacco comment: reported as quit smoking around 1990s per spouse   Substance and Sexual Activity   ??? Alcohol use: No   ??? Drug use: Not Currently     Types: Marijuana     Comment: states she has not used marijuana in months   ??? Sexual activity: Never   Lifestyle   ??? Physical activity     Days per week: Not on file     Minutes per session: Not on file   ??? Stress: Not on file   Relationships   ??? Social Product manager on phone: Not on file     Gets together: Not on file     Attends religious service: Not on file     Active member of club or organization: Not on file     Attends meetings of clubs or organizations: Not on file     Relationship status: Not on file   ??? Intimate partner violence     Fear of current or ex partner: Not on file     Emotionally abused: Not on file     Physically abused: Not on file     Forced sexual activity: Not on file   Other Topics Concern   ??? Not on file   Social History Narrative   ??? Not on file         ALLERGIES: Chocolate [cocoa]    Review of Systems   Constitutional: Negative.  Negative for fever.   HENT: Negative.    Eyes: Negative.     Respiratory: Negative.  Negative for cough, chest tightness and shortness of breath.    Cardiovascular: Negative.  Negative for chest pain and leg swelling.   Gastrointestinal: Negative.  Negative for abdominal pain, blood in stool, constipation, diarrhea, nausea and vomiting.   Genitourinary: Negative.  Negative for dysuria, flank pain and vaginal discharge.        B/L groin pain   Musculoskeletal: Positive for arthralgias (B/L groin pain). Negative for back pain.   Skin: Negative.    Neurological: Negative.  Negative for weakness and numbness.   All other systems reviewed and are negative.      Vitals:    07/05/19 1649 07/05/19 1737   BP: (!) 108/58 104/83   Pulse: 100 91   Resp: 21    Temp: 99.4 ??F (37.4 ??C)    SpO2: 96% 98%            Physical Exam  Vitals signs and nursing note reviewed.   Constitutional:       General: She is not in acute distress.     Appearance: Normal appearance. She is well-developed. She is obese. She is not ill-appearing, toxic-appearing or diaphoretic.   HENT:      Head: Normocephalic and atraumatic.      Nose: Nose normal.      Mouth/Throat:      Mouth: Mucous membranes are moist.   Eyes:      Extraocular Movements: Extraocular movements intact.      Conjunctiva/sclera: Conjunctivae normal.      Pupils: Pupils are equal, round, and reactive to light.   Neck:      Musculoskeletal: Normal range of motion and neck supple.   Cardiovascular:      Rate and Rhythm: Normal rate and regular rhythm.      Pulses: Normal pulses.           Dorsalis pedis pulses are 2+ on  the right side and 2+ on the left side.      Heart sounds: Normal heart sounds.   Pulmonary:      Effort: Pulmonary effort is normal.      Breath sounds: Normal breath sounds.   Abdominal:      General: A surgical scar is present. Bowel sounds are normal. There is no distension.      Palpations: Abdomen is soft.       Tenderness: There is abdominal tenderness in the right lower quadrant. There is no right CVA tenderness, left CVA tenderness, guarding or rebound. Negative signs include Murphy's sign, Rovsing's sign and McBurney's sign.      Hernia: No hernia is present.          Comments: Tender to palpation on B/L groin area.   Musculoskeletal: Normal range of motion.         General: Tenderness present. No swelling.      Right hip: She exhibits tenderness. She exhibits normal range of motion, normal strength, no bony tenderness, no swelling, no crepitus and no deformity.      Left hip: She exhibits tenderness. She exhibits normal range of motion, normal strength, no bony tenderness, no swelling, no crepitus and no deformity.      Right lower leg: No edema.      Left lower leg: No edema.   Skin:     General: Skin is warm and dry.      Capillary Refill: Capillary refill takes less than 2 seconds.   Neurological:      General: No focal deficit present.      Mental Status: She is alert and oriented to person, place, and time.      Deep Tendon Reflexes: Reflexes are normal and symmetric.          MDM   6:17 PM Blood work-up, urinalysis and abdominal pelvic CT were discussed but patient refused CT because she had a CT a month ago when she had a bout of her chronic pain.  Patient agreed with blood work-up and urinalysis and is requesting pain medication.    EXAM: CT ABD PELV W CONT  ??  INDICATION: LLQ abd pain      ??  TECHNIQUE: Axial CT scan of the abdomen and pelvis with   IV contrast      including coronal and sagittal reconstructions.    DICOM format image data is available to non-affiliated external healthcare  facilities or entities on a secure, media free, reciprocally searchable basis  with patient authorization for 12 months following the date of the study.  ??  ??  COMPARISON: 05/07/2019  ??  FINDINGS:  Lower thorax: Bibasilar bullae.  ??  ABDOMEN:  Liver: Unremarkable. No mass.   Gallbladder and bile ducts: Unremarkable. No calcified stones. No ductal  dilatation.  Pancreas: Unremarkable. No ductal dilatation. No mass.  Spleen: Multiple unchanged splenic lesions.  Adrenals: Unremarkable. No mass.  Kidneys and ureters: 2 mm nonobstructing right kidney stone.  Appendix: No findings to suggest appendicitis.  Stomach and bowel: Stomach unremarkable. No obstruction. No mucosal thickening.  No inflammatory changes. Multiple sigmoid diverticula without diverticulitis.  Peritoneum: Unremarkable. No significant fluid collection. No free air.  Lymph nodes: Unremarkable. No enlarged lymph nodes.  Vasculature: Unremarkable. No aortic aneurysm.  Bones: Total right hip replacement.  Abdominal wall: Unremarkable. No evidence of a hernia.  ??  PELVIS:  Bladder: Unremarkable. No mass.  Reproductive: Hysterectomy.  ??  IMPRESSION  IMPRESSION:  ??  1. Multiple sigmoid diverticula without diverticulitis.  2. 2 mm nonobstructing right kidney stone.  3. Total right hip replacement.  4. Hysterectomy.  ??  Recent Results (from the past 12 hour(s))   CBC WITH AUTOMATED DIFF    Collection Time: 07/05/19  6:30 PM   Result Value Ref Range    WBC 8.2 4.0 - 11.0 1000/mm3    RBC 4.61 3.60 - 5.20 M/uL    HGB 14.0 13.0 - 17.2 gm/dl    HCT 41.4 37.0 - 50.0 %    MCV 89.8 80.0 - 98.0 fL    MCH 30.4 25.4 - 34.6 pg    MCHC 33.8 30.0 - 36.0 gm/dl    PLATELET 204 140 - 450 1000/mm3    MPV 10.5 (H) 6.0 - 10.0 fL    RDW-SD 42.2 36.4 - 46.3      NRBC 0 0 - 0      IMMATURE GRANULOCYTES 0.4 0.0 - 3.0 %    NEUTROPHILS 67.3 (H) 34 - 64 %    LYMPHOCYTES 24.3 (L) 28 - 48 %    MONOCYTES 6.5 1 - 13 %    EOSINOPHILS 1.1 0 - 5 %    BASOPHILS 0.4 0 - 3 %   METABOLIC PANEL, BASIC    Collection Time: 07/05/19  6:30 PM   Result Value Ref Range    Sodium 141 136 - 145 mEq/L    Potassium 3.5 3.5 - 5.1 mEq/L    Chloride 103 98 - 107 mEq/L    CO2 34 (H) 21 - 32 mEq/L    Glucose 116 (H) 74 - 106 mg/dl    BUN 12 7 - 25 mg/dl     Creatinine 1.1 0.6 - 1.3 mg/dl    GFR est AA >60.0      GFR est non-AA 53      Calcium 9.5 8.5 - 10.1 mg/dl    Anion gap 3 (L) 5 - 15 mmol/L   C REACTIVE PROTEIN, QT    Collection Time: 07/05/19  6:30 PM   Result Value Ref Range    C-Reactive protein <2.9 0.0 - 2.9 mg/L   SED RATE (ESR)    Collection Time: 07/05/19  6:30 PM   Result Value Ref Range    Sed rate (ESR) 28 0 - 30 mm/Hr   POC URINE MACROSCOPIC    Collection Time: 07/05/19  8:13 PM   Result Value Ref Range    Glucose Negative NEGATIVE,Negative mg/dl    Bilirubin Negative NEGATIVE,Negative      Ketone Negative NEGATIVE,Negative mg/dl    Specific gravity 1.025 1.005 - 1.030      Blood Moderate (A) NEGATIVE,Negative      pH (UA) 6.0 5 - 9      Protein 30 (A) NEGATIVE,Negative mg/dl    Urobilinogen 0.2 0.0 - 1.0 EU/dl    Nitrites Negative NEGATIVE,Negative      Leukocyte Esterase Small (A) NEGATIVE,Negative      Color Yellow      Appearance Cloudy     POC URINE MICROSCOPIC    Collection Time: 07/05/19  8:13 PM   Result Value Ref Range    Epithelial cells, squamous 5-9 /LPF    WBC 5-9 /HPF    RBC OCCASIONAL /HPF    Bacteria 4+ /HPF    Yeast, urine 2+ /HPF      8:52 PM Blood work-up is unremarkable.  Urinalysis shows small leukocyte esterase with white blood cell 5-9, bacteria +4, yeast +2  and epithelial cells 5-9.  Urine culture was ordered.  Urinalysis is similar to previous urinalysis showing contamination.  I will prescribe medication for detected yeast in urine. Patient was informed of the results and states that she feels much better after treatment.  Abdominopelvic CT was again discussed but patient refused it and stated her pain is her usual chronic groin pain.  Patient's husband stated that he will contact orthopedic doctor on Monday for close follow-up. Patient is comfortable in no distress and nontoxic.  No suspicion of septic arthritis, DVT, acute abdomen, aortic dissection, PID, ovarian torsion.  Patient will be discharged home with instructions to follow with the primary doctor and to return to the ED if no improvement or worsening of the condition.  Patient understood and agreed.    ProceduresNA      ICD-10-CM ICD-9-CM   1. Groin pain, chronic, right  R10.31 789.03    G89.29 338.29   2. Groin pain, chronic, left  R10.32 789.04    G89.29 338.29   3. Yeast detected  B37.9 112.9

## 2019-07-05 NOTE — ED Notes (Signed)
Pt d/c with instructions without question. Ambulatory with steady gait.      My Medications      START taking these medications      Instructions Each Dose to Equal Morning Noon Evening Bedtime   clotrimazole 1 % vaginal cream  Commonly known as: MYCELEX    Your last dose was:     Your next dose is:         Insert 1 Applicator into vagina nightly for 7 days.   1 Applicator                 ibuprofen 600 mg tablet  Commonly known as: MOTRIN    Your last dose was:     Your next dose is:         Take 1 Tab by mouth every six (6) hours as needed for Pain.   600 mg                 methocarbamoL 500 mg tablet  Commonly known as: Robaxin    Your last dose was:     Your next dose is:         Take 1 Tab by mouth four (4) times daily for 5 days.   500 mg                    ASK your doctor about these medications      Instructions Each Dose to Equal Morning Noon Evening Bedtime   albuterol 1.25 mg/3 mL Nebu  Commonly known as: ACCUNEB    Your last dose was:     Your next dose is:         USE 1 VIAL IN NEBULIZER EVERY 6 HOURS AS NEEDED FOR  SHORTNESS  OF  BREATH,  WHEEZING                  cetirizine 10 mg tablet  Commonly known as: ZYRTEC    Your last dose was:     Your next dose is:         Take 1 Tab by mouth daily.   10 mg                 cloNIDine HCL 0.1 mg tablet  Commonly known as: CATAPRES    Your last dose was:     Your next dose is:         Take 3 Tabs by mouth two (2) times a day.   0.3 mg                 dilTIAZem ER 240 mg capsule  Commonly known as: CARDIZEM CD    Your last dose was:     Your next dose is:         Take 1 Cap by mouth daily.   240 mg                 fluticasone propion-salmeteroL 100-50 mcg/dose diskus inhaler  Commonly known as: Advair Diskus    Your last dose was:     Your next dose is:         Take 1 Puff by inhalation two (2) times a day.   1 Puff                 fluticasone propionate 50 mcg/actuation nasal spray  Commonly known as: FLONASE    Your last dose was:     Your next dose is:  2 Sprays by Both Nostrils route daily.   2 Spray                 gabapentin 300 mg capsule  Commonly known as: NEURONTIN    Your last dose was:     Your next dose is:         Take 1 Cap by mouth three (3) times daily. Max Daily Amount: 900 mg.   300 mg                 guaiFENesin ER 600 mg ER tablet  Commonly known as: MUCINEX    Your last dose was:     Your next dose is:         Take 1 Tab by mouth every twelve (12) hours.   600 mg                 Lantus Solostar U-100 Insulin 100 unit/mL (3 mL) Inpn  Generic drug: insulin glargine    Your last dose was:     Your next dose is:         INJECT 32 UNITS SUBCUTANEOUSLY  NIGHTLY                  losartan 50 mg tablet  Commonly known as: COZAAR    Your last dose was:     Your next dose is:         Take 1 Tab by mouth daily.   50 mg                 magnesium oxide 400 mg tablet  Commonly known as: MAG-OX    Your last dose was:     Your next dose is:         Take 400 mg by mouth daily.   400 mg                 metFORMIN ER 500 mg tablet  Commonly known as: GLUCOPHAGE XR    Your last dose was:     Your next dose is:         TAKE 1 TABLET TWICE DAILY                  montelukast 10 mg tablet  Commonly known as: SINGULAIR    Your last dose was:     Your next dose is:         TAKE 1 TABLET EVERY DAY                  polyethylene glycol 17 gram packet  Commonly known as: MIRALAX    Your last dose was:     Your next dose is:         Take 1 Packet by mouth daily.   17 g                 QUEtiapine SR 150 mg sr tablet  Commonly known as: SEROquel XR    Your last dose was:     Your next dose is:         Take 150 mg by mouth nightly as needed.   150 mg                 rosuvastatin 5 mg tablet  Commonly known as: CRESTOR    Your last dose was:     Your next dose is:         TAKE 1 TABLET  BY MOUTH NIGHTLY                  tiotropium bromide 2.5 mcg/actuation inhaler  Commonly known as: SPIRIVA RESPIMAT    Your last dose was:     Your next dose is:          Take 1 Puff by inhalation daily.   1 Puff                       Where to Get Your Medications      Information on where to get these meds will be given to you by the nurse or doctor.    Ask your nurse or doctor about these medications  ?? clotrimazole 1 % vaginal cream  ?? ibuprofen 600 mg tablet  ?? methocarbamoL 500 mg tablet        Dicussed above meds

## 2019-07-05 NOTE — ED Notes (Signed)
Pt states she is having L Lower groin pain that started today.  Denies any NVD

## 2019-07-05 NOTE — ED Notes (Signed)
Pt d/c with instructions without question. Ambulatory with steady gait.      My Medications      START taking these medications      Instructions Each Dose to Equal Morning Noon Evening Bedtime   clotrimazole 1 % vaginal cream  Commonly known as: MYCELEX    Your last dose was:     Your next dose is:         Insert 1 Applicator into vagina nightly for 7 days.   1 Applicator                 ibuprofen 600 mg tablet  Commonly known as: MOTRIN    Your last dose was:     Your next dose is:         Take 1 Tab by mouth every six (6) hours as needed for Pain.   600 mg                 methocarbamoL 500 mg tablet  Commonly known as: Robaxin    Your last dose was:     Your next dose is:         Take 1 Tab by mouth four (4) times daily for 5 days.   500 mg                    ASK your doctor about these medications      Instructions Each Dose to Equal Morning Noon Evening Bedtime   albuterol 1.25 mg/3 mL Nebu  Commonly known as: ACCUNEB    Your last dose was:     Your next dose is:         USE 1 VIAL IN NEBULIZER EVERY 6 HOURS AS NEEDED FOR  SHORTNESS  OF  BREATH,  WHEEZING                  cetirizine 10 mg tablet  Commonly known as: ZYRTEC    Your last dose was:     Your next dose is:         Take 1 Tab by mouth daily.   10 mg                 cloNIDine HCL 0.1 mg tablet  Commonly known as: CATAPRES    Your last dose was:     Your next dose is:         Take 3 Tabs by mouth two (2) times a day.   0.3 mg                 dilTIAZem ER 240 mg capsule  Commonly known as: CARDIZEM CD    Your last dose was:     Your next dose is:         Take 1 Cap by mouth daily.   240 mg                 fluticasone propion-salmeteroL 100-50 mcg/dose diskus inhaler  Commonly known as: Advair Diskus    Your last dose was:     Your next dose is:         Take 1 Puff by inhalation two (2) times a day.   1 Puff                 fluticasone propionate 50 mcg/actuation nasal spray  Commonly known as: FLONASE    Your last dose was:     Your next dose is:  2 Sprays by Both Nostrils route daily.   2 Spray                 gabapentin 300 mg capsule  Commonly known as: NEURONTIN    Your last dose was:     Your next dose is:         Take 1 Cap by mouth three (3) times daily. Max Daily Amount: 900 mg.   300 mg                 guaiFENesin ER 600 mg ER tablet  Commonly known as: MUCINEX    Your last dose was:     Your next dose is:         Take 1 Tab by mouth every twelve (12) hours.   600 mg                 Lantus Solostar U-100 Insulin 100 unit/mL (3 mL) Inpn  Generic drug: insulin glargine    Your last dose was:     Your next dose is:         INJECT 32 UNITS SUBCUTANEOUSLY  NIGHTLY                  losartan 50 mg tablet  Commonly known as: COZAAR    Your last dose was:     Your next dose is:         Take 1 Tab by mouth daily.   50 mg                 magnesium oxide 400 mg tablet  Commonly known as: MAG-OX    Your last dose was:     Your next dose is:         Take 400 mg by mouth daily.   400 mg                 metFORMIN ER 500 mg tablet  Commonly known as: GLUCOPHAGE XR    Your last dose was:     Your next dose is:         TAKE 1 TABLET TWICE DAILY                  montelukast 10 mg tablet  Commonly known as: SINGULAIR    Your last dose was:     Your next dose is:         TAKE 1 TABLET EVERY DAY                  polyethylene glycol 17 gram packet  Commonly known as: MIRALAX    Your last dose was:     Your next dose is:         Take 1 Packet by mouth daily.   17 g                 QUEtiapine SR 150 mg sr tablet  Commonly known as: SEROquel XR    Your last dose was:     Your next dose is:         Take 150 mg by mouth nightly as needed.   150 mg                 rosuvastatin 5 mg tablet  Commonly known as: CRESTOR    Your last dose was:     Your next dose is:         TAKE 1 TABLET  BY MOUTH NIGHTLY                  tiotropium bromide 2.5 mcg/actuation inhaler  Commonly known as: SPIRIVA RESPIMAT    Your last dose was:     Your next dose is:         Take 1 Puff by inhalation  daily.   1 Puff                       Where to Get Your Medications      Information on where to get these meds will be given to you by the nurse or doctor.    Ask your nurse or doctor about these medications   clotrimazole 1 % vaginal cream   ibuprofen 600 mg tablet   methocarbamoL 500 mg tablet        Dicussed above meds

## 2019-07-05 NOTE — ED Provider Notes (Signed)
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  Patient states that  she was doing okay until this morning when she started with pain in the left groin area that moved to the right groin more when she moves.  Patient states that it is not the first time with this pain and she is supposed to follow with an orthopedic doctor  but has not not been able to do it yet.  Yesterday, her primary doctor prescribed gabapentin but she has not picked it up yet.  Patient denies fever, chest pain or shortness of breath, nausea or vomiting, diarrhea or constipation, urinary changes, abnormal  vaginal discharge, abdominal pain, back pain, worsening of her chronic tingling sensation on her feet from diabetic neuropathy, trauma, weakness, or other complaints.     Past Medical History:        Diagnosis  Date         ?  Arthritis       ?  Chronic pain syndrome            related to R hip replacement         ?  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  DM2 (diabetes mellitus, type 2) ([place])       ?  GERD (gastroesophageal reflux disease)       ?  Hepatitis C            HEP C         ?  HTN (hypertension)       ?  Lung nodule, multiple            (CT 10/2016) Small left upper lobe and 1.7 x 1.6 cm left lower lobe nodules not significantly changed from 07/23/2016 and 03/22/2016         ?  Marijuana use       ?  PTSD (post-traumatic stress disorder)            lived  through the [place] in 1993         ?  Thoracic ascending aortic aneurysm ([place])            4.4cm noted on CT Chest (10/2016)         ?  Thyroid nodule            2.5 cm stable right thyroid nodule (CT 10/2016)             Past Surgical History:         Procedure  Laterality  Date          ?  HX ENDOSCOPY         ?  HX HERNIA  REPAIR    2019          x2          ?  HX HIP REPLACEMENT  Right  01/29/2015     ?  HX HIP REPLACEMENT         ?  HX HYSTERECTOMY    2010          hysterectomy               Family History:         Problem  Relation  Age of Onset          ?  Hypertension  Mother       ?  Other  Mother                OSA          ?  Cancer  Father                suspected leukemia per pt's spouse          ?  Cancer  Maternal Aunt            ?  Alcohol abuse  Maternal Grandfather               Social History          Socioeconomic History         ?  Marital status:  MARRIED              Spouse name:  Not on file         ?  Number of children:  Not on file     ?  Years of education:  Not on file     ?  Highest education level:  Not on file       Occupational History        ?  Not on file       Social Needs         ?  Financial resource strain:  Not on file        ?  Food insecurity              Worry:  Not on file         Inability:  Not on file        ?  Transportation needs              Medical:  Not on file         Non-medical:  Not on file       Tobacco Use         ?  Smoking status:  Former Smoker     ?  Smokeless tobacco:  Never Used        ?  Tobacco comment: reported as quit smoking around 1990s per spouse       Substance and Sexual Activity         ?  Alcohol use:  No     ?  Drug use:  Not Currently              Types:  Marijuana             Comment: states she has not used marijuana in months         ?  Sexual activity:  Never       Lifestyle        ?  Physical activity  Days per week:  Not on file         Minutes per session:  Not on file         ?  Stress:  Not on file        Relationships        ?  Social Health visitor on phone:  Not on file         Gets together:  Not on file         Attends religious service:  Not on file         Active member of club or organization:  Not on file         Attends meetings of clubs or organizations:  Not on file         Relationship status:  Not on file        ?  Intimate partner violence              Fear of current or ex partner:  Not on file         Emotionally abused:  Not on file         Physically abused:  Not on file         Forced sexual activity:  Not on file        Other Topics  Concern        ?  Not on file       Social History Narrative        ?  Not on file              ALLERGIES: Chocolate [cocoa]      Review of Systems    Constitutional: Negative.  Negative for fever.    HENT: Negative.     Eyes: Negative.     Respiratory: Negative.  Negative for cough, chest tightness and shortness of breath.     Cardiovascular: Negative.  Negative for chest pain and leg swelling.    Gastrointestinal: Negative.  Negative for abdominal pain, blood in stool, constipation, diarrhea, nausea and vomiting.    Genitourinary: Negative.  Negative for dysuria, flank pain and vaginal discharge.         B/L groin pain    Musculoskeletal: Positive for arthralgias (B/L groin pain) . Negative for back pain.    Skin: Negative.     Neurological: Negative.  Negative for weakness and numbness.    All other systems reviewed and are negative.           Vitals:           07/05/19 1649  07/05/19 1737         BP:  (!) 108/58  104/83     Pulse:  100  91     Resp:  21       Temp:  99.4 ??F (37.4 ??C)           SpO2:  96%  98%                Physical Exam   Vitals signs and nursing note reviewed.   Constitutional:        General: She is not in acute distress.     Appearance: Normal appearance. She is well-developed. She is obese. She is not ill-appearing,  toxic-appearing or diaphoretic.   HENT :       Head: Normocephalic and atraumatic.  Nose: Nose normal.       Mouth/Throat:      Mouth: Mucous membranes are moist.    Eyes:       Extraocular Movements: Extraocular movements intact.      Conjunctiva/sclera: Conjunctivae normal.      Pupils: Pupils are equal, round, and reactive to light.   Neck:       Musculoskeletal: Normal range of motion and neck supple.   Cardiovascular :       Rate and Rhythm: Normal rate and regular rhythm.      Pulses: Normal pulses.            Dorsalis pedis pulses are 2+ on the right side and 2+  on the left side.      Heart sounds: Normal heart sounds.    Pulmonary:       Effort: Pulmonary effort is normal.      Breath sounds: Normal breath sounds.   Abdominal :      General: A surgical scar is present. Bowel sounds are normal. There is no distension.      Palpations: Abdomen is soft.       Tenderness: There is abdominal tenderness in the right lower quadrant . There is no right CVA tenderness, left CVA tenderness, guarding or rebound. Negative signs include Murphy's sign, Rovsing's sign and McBurney's sign.      Hernia: No hernia is present.            Comments: Tender to palpation on B/L groin area.     Musculoskeletal: Normal range of motion.          General: Tenderness present. No swelling.      Right hip: She exhibits tenderness . She exhibits normal range of motion, normal strength, no bony tenderness, no swelling, no crepitus and no deformity.      Left hip: She exhibits tenderness . She exhibits normal range of motion, normal strength, no bony tenderness, no swelling, no crepitus and no deformity.      Right lower leg: No edema.      Left lower leg: No edema.    Skin:      General: Skin is warm and dry.      Capillary Refill: Capillary refill takes less than 2 seconds.    Neurological:       General: No focal deficit present.      Mental Status: She is alert and oriented to person, place, and time.      Deep Tendon Reflexes: Reflexes are normal and symmetric.             MDM    6:17 PM Blood work-up, urinalysis and abdominal pelvic CT  were discussed but patient refused CT because she had a CT a month ago when she had  a bout of her chronic pain.  Patient agreed with blood work-up and urinalysis and is requesting pain medication.      EXAM: CT ABD PELV W CONT   ??   INDICATION: LLQ abd pain       ??   TECHNIQUE: Axial CT scan of the abdomen and pelvis with   IV contrast       including coronal and sagittal reconstructions.     DICOM format image data is available to non-affiliated external healthcare   facilities or entities on a secure, media free, reciprocally searchable basis   with patient authorization for 12 months following the date of the study.   ??   ??  COMPARISON: 05/07/2019   ??   FINDINGS:   Lower thorax: Bibasilar bullae.   ??   ABDOMEN:   Liver: Unremarkable. No mass.   Gallbladder and bile ducts: Unremarkable. No calcified stones. No ductal   dilatation.   Pancreas: Unremarkable. No ductal dilatation. No mass.   Spleen: Multiple unchanged splenic lesions.   Adrenals: Unremarkable. No mass.   Kidneys and ureters: 2 mm nonobstructing right kidney stone.   Appendix: No findings to suggest appendicitis.   Stomach and bowel: Stomach unremarkable. No obstruction. No mucosal thickening.   No inflammatory changes. Multiple sigmoid diverticula without diverticulitis.   Peritoneum: Unremarkable. No significant fluid collection. No free air.   Lymph nodes: Unremarkable. No enlarged lymph nodes.   Vasculature: Unremarkable. No aortic aneurysm.   Bones: Total right hip replacement.   Abdominal wall: Unremarkable. No evidence of a hernia.   ??   PELVIS:   Bladder: Unremarkable. No mass.   Reproductive: Hysterectomy.   ??   IMPRESSION   IMPRESSION:   ??   1. Multiple sigmoid diverticula without diverticulitis.   2. 2 mm nonobstructing right kidney stone.   3. Total right hip replacement.   4. Hysterectomy.   ??     Recent Results (from the past 12 hour(s))     CBC WITH AUTOMATED DIFF          Collection Time: 07/05/19  6:30 PM         Result  Value  Ref  Range            WBC  8.2  4.0 - 11.0 1000/mm3       RBC  4.61  3.60 - 5.20 M/uL       HGB  14.0  13.0 - 17.2 gm/dl       HCT  41.4  37.0 - 50.0 %       MCV  89.8  80.0 - 98.0 fL       MCH  30.4  25.4 - 34.6 pg       MCHC  33.8  30.0 - 36.0 gm/dl       PLATELET  204  140 - 450 1000/mm3       MPV  10.5 (H)  6.0 - 10.0 fL       RDW-SD  42.2  36.4 - 46.3         NRBC  0  0 - 0         IMMATURE GRANULOCYTES  0.4  0.0 - 3.0 %       NEUTROPHILS  67.3 (H)  34 - 64 %       LYMPHOCYTES  24.3 (L)  28 - 48 %       MONOCYTES  6.5  1 - 13 %       EOSINOPHILS  1.1  0 - 5 %       BASOPHILS  0.4  0 - 3 %       METABOLIC PANEL, BASIC          Collection Time: 07/05/19  6:30 PM         Result  Value  Ref Range            Sodium  141  136 - 145 mEq/L       Potassium  3.5  3.5 - 5.1 mEq/L       Chloride  103  98 - 107 mEq/L       CO2  34 (H)  21 - 32 mEq/L       Glucose  116 (H)  74 - 106 mg/dl       BUN  12  7 - 25 mg/dl       Creatinine  1.1  0.6 - 1.3 mg/dl       GFR est AA  >60.0          GFR est non-AA  53          Calcium  9.5  8.5 - 10.1 mg/dl       Anion gap  3 (L)  5 - 15 mmol/L       C REACTIVE PROTEIN, QT          Collection Time: 07/05/19  6:30 PM         Result  Value  Ref Range            C-Reactive protein  <2.9  0.0 - 2.9 mg/L       SED RATE (ESR)          Collection Time: 07/05/19  6:30 PM         Result  Value  Ref Range            Sed rate (ESR)  28  0 - 30 mm/Hr       POC URINE MACROSCOPIC          Collection Time: 07/05/19  8:13 PM         Result  Value  Ref Range            Glucose  Negative  NEGATIVE,Negative mg/dl       Bilirubin  Negative  NEGATIVE,Negative         Ketone  Negative  NEGATIVE,Negative mg/dl       Specific gravity  1.025  1.005 - 1.030         Blood  Moderate (A)  NEGATIVE,Negative         pH (UA)  6.0  5 - 9         Protein  30 (A)  NEGATIVE,Negative mg/dl       Urobilinogen  0.2  0.0 - 1.0 EU/dl       Nitrites  Negative  NEGATIVE,Negative         Leukocyte Esterase  Small (A)  NEGATIVE,Negative          Color  Yellow          Appearance  Cloudy          POC URINE MICROSCOPIC          Collection Time: 07/05/19  8:13 PM         Result  Value  Ref Range            Epithelial cells, squamous  5-9  /LPF       WBC  5-9  /HPF       RBC  OCCASIONAL  /HPF       Bacteria  4+  /HPF            Yeast, urine  2+  /HPF        8:52 PM Blood work-up is unremarkable.  Urinalysis shows small leukocyte esterase with white blood cell 5-9, bacteria +4, yeast +2 and epithelial cells 5-9.  Urine culture was ordered.   Urinalysis is similar to previous urinalysis showing contamination.  I will prescribe medication for detected yeast in urine. Patient was informed of the results and states  that she feels much better after treatment.  Abdominopelvic CT was again discussed  but patient refused it and stated her pain is her usual chronic groin pain.  Patient's husband stated that he will contact orthopedic doctor on Monday for close follow-up. Patient is comfortable in no distress and nontoxic.  No suspicion of septic arthritis,  DVT, acute abdomen, aortic dissection, PID, ovarian torsion.  Patient will be discharged home with instructions to follow with the primary doctor and to return to the ED if no improvement or worsening of the condition.  Patient understood and agreed.      ProceduresNA               ICD-10-CM  ICD-9-CM          1.  Groin pain, chronic, right   R10.31  789.03           G89.29  338.29          2.  Groin pain, chronic, left   R10.32  789.04           G89.29  338.29          3.  Yeast detected   B37.9  112.9

## 2019-07-06 LAB — POC URINE MACROSCOPIC
Bilirubin, Urine: NEGATIVE
Bilirubin: NEGATIVE
Glucose, Ur: NEGATIVE mg/dl
Glucose: NEGATIVE mg/dl
Ketone: NEGATIVE mg/dl
Ketones, Urine: NEGATIVE mg/dl
Nitrite, Urine: NEGATIVE
Nitrites: NEGATIVE
Protein, UA: 30 mg/dl — AB
Protein: 30 mg/dl — AB
Specific Gravity, UA: 1.025 (ref 1.005–1.030)
Specific gravity: 1.025 (ref 1.005–1.030)
Urobilinogen, UA, POCT: 0.2 EU/dl (ref 0.0–1.0)
Urobilinogen: 0.2 EU/dl (ref 0.0–1.0)
pH (UA): 6 (ref 5–9)
pH, UA: 6 (ref 5–9)

## 2019-07-06 LAB — POC URINE MICROSCOPIC

## 2019-07-06 LAB — BASIC METABOLIC PANEL
Anion Gap: 3 mmol/L — ABNORMAL LOW (ref 5–15)
BUN: 12 mg/dl (ref 7–25)
CO2: 34 mEq/L — ABNORMAL HIGH (ref 21–32)
Calcium: 9.5 mg/dl (ref 8.5–10.1)
Chloride: 103 mEq/L (ref 98–107)
Creatinine: 1.1 mg/dl (ref 0.6–1.3)
EGFR IF NonAfrican American: 53
GFR African American: >60
Glucose: 116 mg/dl — ABNORMAL HIGH (ref 74–106)
Potassium: 3.5 mEq/L (ref 3.5–5.1)
Sodium: 141 mEq/L (ref 136–145)

## 2019-07-06 LAB — C-REACTIVE PROTEIN: CRP: <2.9 mg/L (ref 0.0–2.9)

## 2019-07-06 LAB — SEDIMENTATION RATE: Sed Rate: 28 mm/Hr (ref 0–30)

## 2019-07-06 LAB — SED RATE (ESR): Sed rate (ESR): 28 mm/Hr (ref 0–30)

## 2019-07-06 LAB — METABOLIC PANEL, BASIC
Anion gap: 3 mmol/L — ABNORMAL LOW (ref 5–15)
BUN: 12 mg/dl (ref 7–25)
CO2: 34 mEq/L — ABNORMAL HIGH (ref 21–32)
Calcium: 9.5 mg/dl (ref 8.5–10.1)
Chloride: 103 mEq/L (ref 98–107)
Creatinine: 1.1 mg/dl (ref 0.6–1.3)
GFR est AA: >60
GFR est non-AA: 53
Glucose: 116 mg/dl — ABNORMAL HIGH (ref 74–106)
Potassium: 3.5 mEq/L (ref 3.5–5.1)
Sodium: 141 mEq/L (ref 136–145)

## 2019-07-06 LAB — C REACTIVE PROTEIN, QT: C-Reactive protein: <2.9 mg/L (ref 0.0–2.9)

## 2019-07-06 MED ORDER — CLOTRIMAZOLE 1 % VAGINAL CREAM
1 % | Freq: Every evening | VAGINAL | 0 refills | Status: AC
Start: 2019-07-06 — End: 2019-07-12

## 2019-07-06 MED ORDER — IBUPROFEN 600 MG TAB
600 mg | ORAL_TABLET | Freq: Four times a day (QID) | ORAL | 0 refills | Status: DC | PRN
Start: 2019-07-06 — End: 2020-06-02

## 2019-07-06 MED ORDER — METHOCARBAMOL 500 MG TAB
500 mg | ORAL_TABLET | Freq: Four times a day (QID) | ORAL | 0 refills | Status: AC
Start: 2019-07-06 — End: 2019-07-10

## 2019-07-07 LAB — CULTURE, URINE
CULTURE RESULT: >100000 — AB
Culture result: >100000 — AB

## 2019-07-20 ENCOUNTER — Emergency Department: Admit: 2019-07-20 | Payer: MEDICARE | Primary: Physician Assistant

## 2019-07-20 ENCOUNTER — Inpatient Hospital Stay
Admit: 2019-07-20 | Discharge: 2019-07-20 | Disposition: A | Discharge status: Home / Self Care | Payer: MEDICARE | Attending: Emergency Medicine

## 2019-07-20 DIAGNOSIS — M25551 Pain in right hip: Secondary | ICD-10-CM

## 2019-07-20 LAB — POC URINE MACROSCOPIC
Bilirubin, Urine: NEGATIVE
Bilirubin: NEGATIVE
Glucose, Ur: NEGATIVE mg/dl
Glucose: NEGATIVE mg/dl
Ketone: NEGATIVE mg/dl
Ketones, Urine: NEGATIVE mg/dl
Leukocyte Esterase, Urine: NEGATIVE
Leukocyte Esterase: NEGATIVE
Nitrite, Urine: NEGATIVE
Nitrites: NEGATIVE
Protein, UA: NEGATIVE mg/dl
Protein: NEGATIVE mg/dl
Specific Gravity, UA: 1.02 (ref 1.005–1.030)
Specific gravity: 1.02 (ref 1.005–1.030)
Urobilinogen, UA, POCT: 0.2 EU/dl (ref 0.0–1.0)
Urobilinogen: 0.2 EU/dl (ref 0.0–1.0)
pH (UA): 5.5 (ref 5–9)
pH, UA: 5.5 (ref 5–9)

## 2019-07-20 MED ORDER — HYDROCODONE-ACETAMINOPHEN 5 MG-325 MG TAB
5-325 mg | ORAL | Status: AC
Start: 2019-07-20 — End: 2019-07-20
  Administered 2019-07-20: 19:00:00 via ORAL

## 2019-07-20 MED ORDER — IPRATROPIUM-ALBUTEROL 2.5 MG-0.5 MG/3 ML NEB SOLUTION
2.5 mg-0.5 mg/3 ml | RESPIRATORY_TRACT | Status: AC
Start: 2019-07-20 — End: 2019-07-20
  Administered 2019-07-20: 19:00:00 via RESPIRATORY_TRACT

## 2019-07-20 MED ORDER — ALBUTEROL SULFATE HFA 90 MCG/ACTUATION AEROSOL INHALER
90 mcg/actuation | RESPIRATORY_TRACT | 1 refills | Status: AC | PRN
Start: 2019-07-20 — End: ?

## 2019-07-20 MED ORDER — MORPHINE 4 MG/ML SYRINGE
4 mg/mL | INTRAMUSCULAR | Status: DC
Start: 2019-07-20 — End: 2019-07-20

## 2019-07-20 MED ORDER — SODIUM CHLORIDE 0.9 % IJ SYRG
Freq: Once | INTRAMUSCULAR | Status: DC
Start: 2019-07-20 — End: 2019-07-20

## 2019-07-20 MED ORDER — ACETAMINOPHEN 325 MG TABLET
325 mg | ORAL_TABLET | ORAL | 0 refills | Status: AC | PRN
Start: 2019-07-20 — End: 2021-05-23

## 2019-07-20 MED ORDER — DEXAMETHASONE 4 MG TAB
4 mg | ORAL | Status: AC
Start: 2019-07-20 — End: 2019-07-20
  Administered 2019-07-20: 21:00:00 via ORAL

## 2019-07-20 MED ORDER — ONDANSETRON (PF) 4 MG/2 ML INJECTION
4 mg/2 mL | Freq: Once | INTRAMUSCULAR | Status: DC
Start: 2019-07-20 — End: 2019-07-20

## 2019-07-20 MED ORDER — METHOCARBAMOL 500 MG TAB
500 mg | ORAL_TABLET | Freq: Four times a day (QID) | ORAL | 0 refills | Status: DC
Start: 2019-07-20 — End: 2020-06-02

## 2019-07-20 MED FILL — IPRATROPIUM-ALBUTEROL 2.5 MG-0.5 MG/3 ML NEB SOLUTION: 2.5 mg-0.5 mg/3 ml | RESPIRATORY_TRACT | Qty: 3

## 2019-07-20 MED FILL — DEXAMETHASONE 4 MG TAB: 4 mg | ORAL | Qty: 2

## 2019-07-20 MED FILL — HYDROCODONE-ACETAMINOPHEN 5 MG-325 MG TAB: 5-325 mg | ORAL | Qty: 1

## 2019-07-20 NOTE — ED Provider Notes (Signed)
[facility]  Emergency Department Treatment Report        Patient: [name] [name] Age: 69 y.o. Sex: female    Date of Birth: Jul 26, 1950 Admit Date: 07/20/2019 PCP: Marjory Lies   MRN: [id number]  CSN: [id number]  Attending: Smitty Cords, MD   Room: ER39/ER39 Time Dictated: 3:16 PM APP: Fonnie Mu, PA-C     Chief Complaint   Chief Complaint   Patient presents with   ??? Groin Pain     right side       History of Present Illness   69 y.o. 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  Patient seen in emergency department multiple times with the same complaint, she states this pain is identical to what is brought her to the ED in the past.  She states she has pain in the right hip that radiates to her right groin, worse with walking.  She states she has been laying around in bed because of the pain over the last couple of days.  She states in the past she had been taking Percocets which were helpful but she has since run out of this medication.  Patient has not followed up with her primary care physician or orthopedic specialist for this pain, she states her husband may have made her an appointment with an orthopedist but she is unsure of the date of the appointment.    Patient states she thought her right leg looked swollen near her right ankle over the past few days.  She denies recent injury or trauma or falls.  She had a right hip replacement several years ago done by a orthopedic specialist which has since moved out of the area.  She denies fevers or chills, she denies numbness, tingling or weakness to the extremities.  She denies pain in her back, saddle paresthesias or urinary or bowel dysfunction.  She denies pain in her abdomen, denies irritative voiding symptoms or hematuria.    Review of Systems   Review of Systems    Constitutional: Negative for chills and fever.   HENT: Negative for congestion and sore throat.    Eyes: Negative for blurred vision and double vision.   Respiratory: Negative for cough and shortness of breath.    Cardiovascular: Negative for chest pain and leg swelling.   Gastrointestinal: Negative for abdominal pain, diarrhea, nausea and vomiting.   Genitourinary: Negative for dysuria and urgency.   Musculoskeletal: Positive for joint pain (Right hip/groin pain/chronic). Negative for myalgias.   Skin: Negative for rash.   Neurological: Negative for sensory change and focal weakness.       Past Medical/Surgical History     Past Medical History:   Diagnosis Date   ??? Arthritis    ??? 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 DM2 (diabetes mellitus, type 2) (HCC)    ??? GERD (gastroesophageal reflux disease)    ??? Hepatitis C     HEP C   ??? HTN (hypertension)    ??? Lung nodule, multiple     (CT 10/2016) Small left upper lobe and 1.7 x 1.6 cm left lower lobe nodules not significantly changed from 07/23/2016 and 03/22/2016   ???  Marijuana use    ??? PTSD (post-traumatic stress disorder)     lived through the [place]   ??? Thoracic ascending aortic aneurysm (HCC)     4.4cm noted on CT Chest (10/2016)   ??? Thyroid nodule     2.5 cm stable right thyroid nodule (CT 10/2016)        Past Surgical History:   Procedure Laterality Date   ??? HX ENDOSCOPY     ??? HX HERNIA REPAIR  2019    x2   ??? HX HIP REPLACEMENT Right 01/29/2015   ??? HX HIP REPLACEMENT     ??? HX HYSTERECTOMY  2010    hysterectomy       Social History     Social History     Socioeconomic History   ??? Marital status: MARRIED     Spouse name: Not on file   ??? Number of children: Not on file   ??? Years of education: Not on file   ??? Highest education level: Not on file   Occupational History   ??? Not on file   Social Needs    ??? Financial resource strain: Not on file   ??? Food insecurity     Worry: Not on file     Inability: Not on file   ??? Transportation needs     Medical: Not on file     Non-medical: Not on file   Tobacco Use   ??? Smoking status: Former Smoker   ??? Smokeless tobacco: Never Used   ??? Tobacco comment: reported as quit smoking around 1990s per spouse   Substance and Sexual Activity   ??? Alcohol use: No   ??? Drug use: Not Currently     Types: Marijuana     Comment: states she has not used marijuana in months   ??? Sexual activity: Never   Lifestyle   ??? Physical activity     Days per week: Not on file     Minutes per session: Not on file   ??? Stress: Not on file   Relationships   ??? Social Wellsite geologist on phone: Not on file     Gets together: Not on file     Attends religious service: Not on file     Active member of club or organization: Not on file     Attends meetings of clubs or organizations: Not on file     Relationship status: Not on file   ??? Intimate partner violence     Fear of current or ex partner: Not on file     Emotionally abused: Not on file     Physically abused: Not on file     Forced sexual activity: Not on file   Other Topics Concern   ??? Not on file   Social History Narrative   ??? Not on file       Family History     Family History   Problem Relation Age of Onset   ??? Hypertension Mother    ??? Other Mother         OSA   ??? Cancer Father         suspected leukemia per pt's spouse   ??? Cancer Maternal Aunt    ??? Alcohol abuse Maternal Grandfather        Current Medications   (Not in a hospital admission)    Prior to Admission Medications   Prescriptions Last Dose Informant  Patient Reported? Taking?   LANTUS SOLOSTAR U-100 INSULIN 100 unit/mL (3 mL) inpn   No No   Sig: INJECT 32 UNITS SUBCUTANEOUSLY  NIGHTLY   QUEtiapine SR (SEROQUEL XR) 150 mg sr tablet   Yes No   Sig: Take 150 mg by mouth nightly as needed.   albuterol (ACCUNEB) 1.25 mg/3 mL nebu   No No    Sig: USE 1 VIAL IN NEBULIZER EVERY 6 HOURS AS NEEDED FOR  SHORTNESS  OF  BREATH,  WHEEZING   cetirizine (ZYRTEC) 10 mg tablet   No No   Sig: Take 1 Tab by mouth daily.   cloNIDine HCl (CATAPRES) 0.1 mg tablet   No No   Sig: Take 3 Tabs by mouth two (2) times a day.   dilTIAZem CD (CARDIZEM CD) 240 mg ER capsule   No No   Sig: Take 1 Cap by mouth daily.   fluticasone propion-salmeterol (ADVAIR DISKUS) 100-50 mcg/dose diskus inhaler   No No   Sig: Take 1 Puff by inhalation two (2) times a day.   fluticasone propionate (FLONASE) 50 mcg/actuation nasal spray   No No   Sig: 2 Sprays by Both Nostrils route daily.   Patient taking differently: 2 Sprays by Both Nostrils route daily as needed.   gabapentin (NEURONTIN) 300 mg capsule   No No   Sig: Take 1 Cap by mouth three (3) times daily. Max Daily Amount: 900 mg.   guaiFENesin ER (MUCINEX) 600 mg ER tablet   No No   Sig: Take 1 Tab by mouth every twelve (12) hours.   Patient taking differently: Take 600 mg by mouth daily as needed.   ibuprofen (MOTRIN) 600 mg tablet   No No   Sig: Take 1 Tab by mouth every six (6) hours as needed for Pain.   losartan (COZAAR) 50 mg tablet   No No   Sig: Take 1 Tab by mouth daily.   magnesium oxide (MAG-OX) 400 mg tablet   Yes No   Sig: Take 400 mg by mouth daily.   metFORMIN ER (GLUCOPHAGE XR) 500 mg tablet   No No   Sig: TAKE 1 TABLET TWICE DAILY   montelukast (SINGULAIR) 10 mg tablet   No No   Sig: TAKE 1 TABLET EVERY DAY   polyethylene glycol (MIRALAX) 17 gram packet   No No   Sig: Take 1 Packet by mouth daily.   Patient taking differently: Take 17 g by mouth daily as needed for Constipation.   rosuvastatin (CRESTOR) 5 mg tablet   No No   Sig: TAKE 1 TABLET BY MOUTH NIGHTLY   tiotropium bromide (SPIRIVA RESPIMAT) 2.5 mcg/actuation inhaler   No No   Sig: Take 1 Puff by inhalation daily.      Facility-Administered Medications: None     Allergies     Allergies   Allergen Reactions   ??? Chocolate [Cocoa] Sneezing      Confirms allergy but reports "I still eat it"       Physical Exam     ED Triage Vitals [07/20/19 1148]   ED Encounter Vitals Group      BP (!) 137/96      Pulse (Heart Rate) 98      Resp Rate 18      Temp 98.4 ??F (36.9 ??C)      Temp src       O2 Sat (%) 99 %      Weight 190 lb  Height 5\' 8"      Physical Exam  Vitals signs and nursing note reviewed.   Constitutional:       Comments: Nontoxic-appearing female sitting on the stretcher no acute distress   HENT:      Head: Normocephalic and atraumatic.      Right Ear: External ear normal.      Left Ear: External ear normal.      Nose: Nose normal.      Mouth/Throat:      Mouth: Mucous membranes are moist.      Pharynx: Oropharynx is clear.   Eyes:      Extraocular Movements: Extraocular movements intact.      Conjunctiva/sclera: Conjunctivae normal.   Neck:      Musculoskeletal: Normal range of motion and neck supple.   Cardiovascular:      Rate and Rhythm: Normal rate and regular rhythm.      Pulses: Normal pulses.   Pulmonary:      Effort: Pulmonary effort is normal. No respiratory distress.      Breath sounds: No stridor. Wheezing present. No rhonchi or rales.   Abdominal:      General: There is no distension.      Palpations: Abdomen is soft.      Tenderness: There is no abdominal tenderness. There is no right CVA tenderness, left CVA tenderness, guarding or rebound.   Musculoskeletal: Normal range of motion.      Comments: Patient with some bony tenderness overlying the right-sided greater trochanter, she is able to go through full range of active range of motion to the right hip, knee and ankle, there is no joint erythema or edema  Mild right ankle and lower extremity edema compared to the left, bilateral calves are soft and nontender to palpation, pedal pulses intact bilaterally, can wiggle all toes with good capillary refill and sensation intact distally   Skin:     General: Skin is warm and dry.   Neurological:      General: No focal deficit present.       Mental Status: She is oriented to person, place, and time. Mental status is at baseline.      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    Patient on arrival appears well, she is known to be afebrile and hemodynamically stable.  Patient has been seen in emergency department multiple times for the same complaint and has not had outpatient follow-up as she has been advised multiple times.  Her right hip has some tenderness to palpation overlying the right greater trochanter, she has had multiple x-rays of the hip and denies any new injury or trauma.  Do not suspect acute bony abnormality such as fracture or need for repeat imaging studies.  Do not suspect septic joint or infectious process.  The right leg is neurovascularly intact distally.  She does have some mild right ankle edema noted, will obtain PVL to rule out DVT and treat symptomatically.    Also of note during patient's examination she was found to have diffuse wheezing on exam, she does have chronic COPD, I offered  her a breathing treatment which she is agreeable to.    Diagnostic Studies   Lab:   Recent Results (from the past 12 hour(s))   POC URINE MACROSCOPIC    Collection Time: 07/20/19  1:55 PM   Result Value Ref Range    Glucose Negative NEGATIVE,Negative mg/dl    Bilirubin Negative NEGATIVE,Negative      Ketone Negative NEGATIVE,Negative mg/dl    Specific gravity 1.6101.020 1.005 - 1.030      Blood Trace-lysed (A) NEGATIVE,Negative      pH (UA) 5.5 5 - 9      Protein Negative NEGATIVE,Negative mg/dl    Urobilinogen 0.2 0.0 - 1.0 EU/dl    Nitrites Negative NEGATIVE,Negative       Leukocyte Esterase Negative NEGATIVE,Negative      Color Yellow      Appearance Clear       Labs Reviewed   POC URINE MACROSCOPIC - Abnormal; Notable for the following components:       Result Value    Blood Trace-lysed (*)     All other components within normal limits       Imaging:    Xr Chest Sngl V    Result Date: 07/20/2019  Examination:  Frontal view of the chest. INDICATION: Coughing, shortness of breath COMPARISON: Chest radiograph dated 06/01/2019.     Impression: Scattered bilateral blebs and bullae, most pronounced within the left upper lung zone manifesting as prominent lucency within this region. Stable densities within the left midlung zone, suggesting a combination of known lung nodules and densities. No acute abnormalities. Findings similar to the previous exam.      Clarene ReamerShepherd, Heather E   Technician   Vascular Lab   Progress Notes   Signed   Date of Service:  07/20/19 1512                    [] Hide copied text    [] Hover for details  PVL RLEV PRELIM:  ??  1. No evidence of DVT within the right lower extremity veins assessed.  2. Patent contralateral common femoral vein with no evidence of DVT.  3. Normal venous signals throughout.  ??  Final report to follow.  ??  Carolynn ServeHeather Shepherd, RVT          ED Course/Medical Decision Making   Patient given a DuoNeb breathing treatment here, she is kept on her 2 L nasal cannula during her time in the emergency department as she uses at home with oxygen saturations in the upper 90s.  She is in no acute respiratory distress, her wheezes did improve after the breathing treatment.  Her chest x-ray shows findings similar to previous exam with scattered bilateral blebs and findings of COPD as interpreted by ED attending.  She is given 1 dose of p.o. Decadron.  Patient states her breathing is at her baseline, she does not feel like she is in a COPD exacerbation and she has been hemodynamically stable.      Patient medicated with 1 dose of p.o. Norco here for her chronic right hip pain.  We obtained a PVL the right lower extremity that was negative for DVT.  Again, her leg is neurovascular intact distally, she has good range of motion and is able to ambulate.  This is chronic pain for the patient, has had xrays and CT scans in the past with no acute findings. We discussed the importance of outpatient follow-up with orthopedic specialist, she is given contact information to Dr.  Goss to schedule a follow-up appointment.  I also coordinated with care management who has initiated home health to come out to patient's home for a PT/OT/nursing evaluation.  She also has a walker at home should she need this for assistance with ambulating.  We discussed with the patient we do not prescribe narcotics for chronic pain control out of the emergency department.  She is given prescription for Tylenol and Robaxin.  Do not suspect vascular pathology or acute intra-abdominal or pelvic etiology requiring repeat emergent imaging studies.  Her urinalysis is negative.    Patient to return to the emergency department for new or worsening symptoms such as fever, worsening pain, abdominal pain, numbness or weakness of the extremities or other concerns.  Patient expressed understanding, she is in agreement with this plan.       Medications   albuterol-ipratropium (DUO-NEB) 2.5 MG-0.5 MG/3 ML (3 mL Nebulization Given 07/20/19 1410)   HYDROcodone-acetaminophen (NORCO) 5-325 mg per tablet 1 Tab (1 Tab Oral Given 07/20/19 1410)   dexAMETHasone (DECADRON) tablet 6 mg (6 mg Oral Given 07/20/19 1531)     Final Diagnosis       ICD-10-CM ICD-9-CM   1. Chronic pain of right hip  M25.551 719.45    G89.29 338.29   2. Chronic obstructive pulmonary disease, unspecified COPD type ([place])  J44.9 496   3. Pain and swelling of lower leg, right  M79.661 729.5    M79.89 729.81       Disposition   Home in stable condition        Discharge Medication List as of 07/20/2019  3:42 PM      START taking these medications    Details   acetaminophen (TYLENOL) 325 mg tablet Take 2 Tabs by mouth every four (4) hours as needed for Pain., Normal, Disp-20 Tab, R-0      methocarbamoL (Robaxin) 500 mg tablet Take 1 Tab by mouth four (4) times daily. As needed for muscle pain or spasm, Normal, Disp-20 Tab, R-0      albuterol (PROVENTIL HFA, VENTOLIN HFA, PROAIR HFA) 90 mcg/actuation inhaler Take 2 Puffs by inhalation every four (4) hours as needed for Wheezing., Normal, Disp-1 Inhaler, R-1         CONTINUE these medications which have NOT CHANGED    Details   ibuprofen (MOTRIN) 600 mg tablet Take 1 Tab by mouth every six (6) hours as needed for Pain., Print, Disp-20 Tab, R-0      dilTIAZem CD (CARDIZEM CD) 240 mg ER capsule Take 1 Cap by mouth daily., Normal, Disp-30 Cap, R-1      losartan (COZAAR) 50 mg tablet Take 1 Tab by mouth daily., Normal, Disp-30 Tab, R-1      cetirizine (ZYRTEC) 10 mg tablet Take 1 Tab by mouth daily., Normal, Disp-30 Tab, R-1      fluticasone propionate (FLONASE) 50 mcg/actuation nasal spray 2 Sprays by Both Nostrils route daily., Normal, Disp-1 Bottle, R-1      guaiFENesin ER (MUCINEX) 600 mg ER tablet Take 1 Tab by mouth every twelve (12) hours., Normal, Disp-20 Tab, R-0      LANTUS SOLOSTAR U-100 INSULIN 100 unit/mL (3 mL) inpn INJECT 32 UNITS SUBCUTANEOUSLY  NIGHTLY, Normal, Disp-15 mL, R-0      albuterol (ACCUNEB) 1.25 mg/3 mL nebu USE 1 VIAL IN NEBULIZER EVERY 6 HOURS AS NEEDED FOR  SHORTNESS  OF  BREATH,  WHEEZING, Normal, Disp-270 mL, R-1      cloNIDine HCl (CATAPRES) 0.1 mg tablet  Take 3 Tabs by mouth two (2) times a day., Normal, Disp-60 Tab, R-1      metFORMIN ER (GLUCOPHAGE XR) 500 mg tablet TAKE 1 TABLET TWICE DAILY, Normal, Disp-180 Tab, R-1      montelukast (SINGULAIR) 10 mg tablet TAKE 1 TABLET EVERY DAY, Normal, Disp-90 Tab, R-3       gabapentin (NEURONTIN) 300 mg capsule Take 1 Cap by mouth three (3) times daily. Max Daily Amount: 900 mg., Print, Disp-270 Cap, R-1      polyethylene glycol (MIRALAX) 17 gram packet Take 1 Packet by mouth daily., Normal, Disp-30 Packet, R-1      magnesium oxide (MAG-OX) 400 mg tablet Take 400 mg by mouth daily., Historical Med      QUEtiapine SR (SEROQUEL XR) 150 mg sr tablet Take 150 mg by mouth nightly as needed., Historical Med      tiotropium bromide (SPIRIVA RESPIMAT) 2.5 mcg/actuation inhaler Take 1 Puff by inhalation daily., Normal, Disp-1 Inhaler, R-0      fluticasone propion-salmeterol (ADVAIR DISKUS) 100-50 mcg/dose diskus inhaler Take 1 Puff by inhalation two (2) times a day., Normal, Disp-1 Inhaler, R-0      rosuvastatin (CRESTOR) 5 mg tablet TAKE 1 TABLET BY MOUTH NIGHTLY, Normal, Disp-90 Tab, R-1           Fonnie MuKate Airelle Everding, PA-C  July 20, 2019  3:16 PM    The patient was personally evaluated by myself and Dr. Arvella MerlesKISA, Wetzel BjornstadERIK H, MD who agrees with the above assessment and plan.  Please note that portions of this note were completed with a voice recognition program. Efforts were made to edit the dictations but occasionally words are mis-transcribed.   My signature above authenticates this document and my orders, the final ??  diagnosis (es), discharge prescription (s), and instructions in the Epic ??  record.  If you have any questions please contact [phone].  ??  Nursing notes have been reviewed by the physician/ advanced practice ??  Clinician.

## 2019-07-20 NOTE — Progress Notes (Signed)
PVL RLEV PRELIM:    1. No evidence of DVT within the right lower extremity veins assessed.  2. Patent contralateral common femoral vein with no evidence of DVT.  3. Normal venous signals throughout.    Final report to follow.    Heather Shepherd, RVT

## 2019-07-20 NOTE — ED Notes (Signed)
Patient arrived SOB, states its from the pain. Also reports she wears oxygen at home but does not know how much she wears. States her husband does it for her.

## 2019-07-20 NOTE — Progress Notes (Addendum)
[facility]  Face to Face Documentation for Home Health Care      Patient???s Name: [name] [name]    Date of Birth: 02/28/1951    Date of Face to Face:  07/20/2019                                    Face to Face Encounter findings are related to primary reason for home care:   yes    I certify that this patient is under my care and has a Face to Face Encounter related to the primary reason for home health.    1. I certify that the patient needs intermittent skilled nursing care, physical therapy and/or speech therapy.     2. I certify that this patient is homebound for the following reason(s): Requires considerable and taxing effort to leave the home     3. 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

## 2019-07-20 NOTE — ED Triage Notes (Signed)
PT reports 7/10 pain in right side groin. Denies dysuria, denies hematuria.

## 2019-07-20 NOTE — ED Notes (Signed)
I have reviewed discharge instructions with the patient.  The patient verbalized understanding.

## 2019-07-20 NOTE — Progress Notes (Signed)
At request of m.d. placed order for h.h  - pt . ot and nursing. Called and conformed with comfort care who client had in the past.

## 2019-07-20 NOTE — ED Provider Notes (Signed)
ED Provider Notes by Mel Almond, PA-C at 07/20/19 1516                Author: Mel Almond, PA-C  Service: EMERGENCY  Author Type: Physician Assistant       Filed: 07/20/19 2004  Date of Service: 07/20/19 1516  Status: Attested           Editor: Glenetta Kiger, Gabriel Rung, PA-C (Physician Assistant)  Cosigner: Smitty Cords, MD at 07/21/19 [phone]          Attestation signed by Smitty Cords, MD at 07/21/19 [phone]          I have discussed this case with the advanced practice provider. We have reviewed the presentation, pertinent historical and physical exam findings, as well  as relevant  findings. I have been available at all times and agree with the management and disposition documented here.      Smitty Cords, MD   July 21, 2019                                    [facility]   Emergency Department Treatment Report                Patient: [name] [name]  Age: 69 y.o.  Sex: female          Date of Birth: December 14, 1950  Admit Date: 07/20/2019  PCP: Marjory Lies     MRN: [id number]   CSN: [id number]   Attending: Smitty Cords, MD         Room: ER39/ER39  Time Dictated: 3:16 PM  APP: Fonnie Mu, PA-C        Chief Complaint      Chief Complaint       Patient presents with        ?  Groin Pain             right side             History of Present Illness     69 y.o. 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  Patient seen in emergency department multiple  times with the same complaint, she states this pain is identical to what is brought her to the ED in the past.  She states she has pain in the right hip that radiates to her right groin, worse with walking.  She states she has been laying around in bed  because of the pain over the last couple of days.  She states in the past she had been taking Percocets which  were helpful but she has since run out of this medication.  Patient has not followed up with her primary care physician or orthopedic specialist  for this pain, she states her husband may have made her an appointment with an orthopedist but she is unsure of the date of the appointment.      Patient states she thought her right leg looked swollen near her right ankle over the past few days.  She denies recent injury or trauma or falls.  She had a right hip replacement several years ago done by a orthopedic specialist which has since moved  out of the area.  She denies fevers  or chills, she denies numbness, tingling or weakness to the extremities.  She denies pain in her back, saddle paresthesias or urinary or bowel dysfunction.  She denies pain in her abdomen, denies irritative voiding  symptoms or hematuria.        Review of Systems     Review of Systems    Constitutional: Negative for chills and fever.    HENT: Negative for congestion and sore throat.     Eyes: Negative for blurred vision and double vision.    Respiratory: Negative for cough and shortness of breath.     Cardiovascular: Negative for chest pain and leg swelling.    Gastrointestinal: Negative for abdominal pain, diarrhea, nausea and vomiting.    Genitourinary: Negative for dysuria and urgency.    Musculoskeletal: Positive for joint pain (Right hip/groin pain/chronic) . Negative for myalgias.    Skin: Negative for rash.    Neurological: Negative for sensory change and focal weakness.            Past Medical/Surgical History          Past Medical History:        Diagnosis  Date         ?  Arthritis       ?  Chronic pain syndrome            related to R hip replacement         ?  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  DM2 (diabetes mellitus, type 2) ([place])       ?  GERD (gastroesophageal reflux disease)       ?  Hepatitis C            HEP C         ?  HTN (hypertension)       ?  Lung  nodule, multiple            (CT 10/2016) Small left upper lobe and 1.7 x 1.6 cm left lower lobe nodules not significantly changed from 07/23/2016 and 03/22/2016         ?  Marijuana use       ?  PTSD (post-traumatic stress disorder)            lived through the [place] in 1993         ?  Thoracic ascending aortic aneurysm ([place])            4.4cm noted on CT Chest (10/2016)         ?  Thyroid nodule            2.5 cm stable right thyroid nodule (CT 10/2016)              Past Surgical History:         Procedure  Laterality  Date          ?  HX ENDOSCOPY         ?  HX HERNIA REPAIR    2019          x2          ?  HX HIP REPLACEMENT  Right  01/29/2015     ?  HX HIP REPLACEMENT         ?  HX HYSTERECTOMY    2010  hysterectomy             Social History          Social History          Socioeconomic History         ?  Marital status:  MARRIED              Spouse name:  Not on file         ?  Number of children:  Not on file     ?  Years of education:  Not on file     ?  Highest education level:  Not on file       Occupational History        ?  Not on file       Social Needs         ?  Financial resource strain:  Not on file        ?  Food insecurity              Worry:  Not on file         Inability:  Not on file        ?  Transportation needs              Medical:  Not on file         Non-medical:  Not on file       Tobacco Use         ?  Smoking status:  Former Smoker     ?  Smokeless tobacco:  Never Used        ?  Tobacco comment: reported as quit smoking around 1990s per spouse       Substance and Sexual Activity         ?  Alcohol use:  No     ?  Drug use:  Not Currently              Types:  Marijuana             Comment: states she has not used marijuana in months         ?  Sexual activity:  Never       Lifestyle        ?  Physical activity              Days per week:  Not on file         Minutes per session:  Not on file         ?  Stress:  Not on file       Relationships        ?  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  Intimate partner violence              Fear of current or ex partner:  Not  on file         Emotionally abused:  Not on file         Physically abused:  Not on file         Forced sexual activity:  Not on file        Other Topics  Concern        ?  Not on file       Social History Narrative        ?  Not on file             Family History          Family History         Problem  Relation  Age of Onset          ?  Hypertension  Mother       ?  Other  Mother                OSA          ?  Cancer  Father                suspected leukemia per pt's spouse          ?  Cancer  Maternal Aunt            ?  Alcohol abuse  Maternal Grandfather               Current Medications     (Not in a hospital admission)        Prior to Admission Medications     Prescriptions  Last Dose  Informant  Patient Reported?  Taking?      LANTUS SOLOSTAR U-100 INSULIN 100 unit/mL (3 mL) inpn      No  No      Sig: INJECT 32 UNITS SUBCUTANEOUSLY  NIGHTLY      QUEtiapine SR (SEROQUEL XR) 150 mg sr tablet      Yes  No      Sig: Take 150 mg by mouth nightly as needed.      albuterol (ACCUNEB) 1.25 mg/3 mL nebu      No  No      Sig: USE 1 VIAL IN NEBULIZER EVERY 6 HOURS AS NEEDED FOR  SHORTNESS  OF  BREATH,  WHEEZING      cetirizine (ZYRTEC) 10 mg tablet      No  No      Sig: Take 1 Tab by mouth daily.      cloNIDine HCl (CATAPRES) 0.1 mg tablet      No  No      Sig: Take 3 Tabs by mouth two (2) times a day.      dilTIAZem CD (CARDIZEM CD) 240 mg ER capsule      No  No      Sig: Take 1 Cap by mouth daily.      fluticasone propion-salmeterol (ADVAIR DISKUS) 100-50 mcg/dose diskus inhaler      No  No      Sig: Take 1 Puff by inhalation two (2)  times a day.      fluticasone propionate (FLONASE) 50 mcg/actuation nasal spray      No  No      Sig: 2 Sprays by Both Nostrils route daily.      Patient taking differently: 2 Sprays by Both Nostrils route daily as needed.      gabapentin (NEURONTIN) 300 mg capsule  No  No      Sig: Take 1 Cap by mouth three (3) times daily. Max Daily Amount: 900 mg.      guaiFENesin ER (MUCINEX) 600 mg ER tablet      No  No      Sig: Take 1 Tab by mouth every twelve (12) hours.      Patient taking differently: Take 600 mg by mouth daily as needed.      ibuprofen (MOTRIN) 600 mg tablet      No  No      Sig: Take 1 Tab by mouth every six (6) hours as needed for Pain.      losartan (COZAAR) 50 mg tablet      No  No      Sig: Take 1 Tab by mouth daily.      magnesium oxide (MAG-OX) 400 mg tablet      Yes  No      Sig: Take 400 mg by mouth daily.      metFORMIN ER (GLUCOPHAGE XR) 500 mg tablet      No  No      Sig: TAKE 1 TABLET TWICE DAILY      montelukast (SINGULAIR) 10 mg tablet      No  No      Sig: TAKE 1 TABLET EVERY DAY      polyethylene glycol (MIRALAX) 17 gram packet      No  No      Sig: Take 1 Packet by mouth daily.      Patient taking differently: Take 17 g by mouth daily as needed for Constipation.      rosuvastatin (CRESTOR) 5 mg tablet      No  No      Sig: TAKE 1 TABLET BY MOUTH NIGHTLY      tiotropium bromide (SPIRIVA RESPIMAT) 2.5 mcg/actuation inhaler      No  No      Sig: Take 1 Puff by inhalation daily.               Facility-Administered Medications: None          Allergies          Allergies        Allergen  Reactions         ?  Chocolate [Cocoa]  Sneezing             Confirms allergy but reports "I still eat it"             Physical Exam          ED Triage Vitals [07/20/19 1148]     ED Encounter Vitals Group           BP  (!) 137/96        Pulse (Heart Rate)  98        Resp Rate  18        Temp  98.4 ??F (36.9 ??C)        Temp src          O2 Sat (%)  99 %        Weight  190 lb           Height  5\' 8"          Physical Exam   Vitals signs and nursing note reviewed.   Constitutional:        Comments: Nontoxic-appearing female sitting on the stretcher no acute distress   HENT:  Head: Normocephalic and atraumatic.      Right Ear: External ear normal.      Left Ear: External ear normal.      Nose: Nose normal.      Mouth/Throat:      Mouth: Mucous membranes are moist.      Pharynx: Oropharynx is clear.   Eyes:       Extraocular Movements: Extraocular movements intact.      Conjunctiva/sclera: Conjunctivae normal.    Neck:       Musculoskeletal: Normal range of motion and neck supple.   Cardiovascular :       Rate and Rhythm: Normal rate and regular rhythm.      Pulses: Normal pulses.    Pulmonary:       Effort: Pulmonary effort is normal. No respiratory distress.      Breath sounds: No stridor. Wheezing present. No rhonchi or rales.    Abdominal:      General: There is no distension.      Palpations: Abdomen is soft.      Tenderness: There is no abdominal tenderness. There is no right CVA tenderness, left CVA tenderness,  guarding or rebound.     Musculoskeletal: Normal range of motion.      Comments: Patient with some bony tenderness overlying the right-sided greater trochanter,  she is able to go through full range of active range of motion to the right hip, knee and ankle, there is no joint erythema or edema  Mild right ankle and lower extremity edema compared to the left, bilateral calves are soft and nontender to palpation,  pedal pulses intact bilaterally, can wiggle all toes with good capillary refill and sensation intact distally    Skin:      General: Skin is warm and dry.   Neurological :       General: No focal deficit present.      Mental Status: She is oriented to person, place, and time. Mental status is at baseline.      Comments: Motor strength and sensation light touch  is intact and symmetric bilateral lower extremities               Impression and Management Plan     69 year old female history of COPD  on chronic oxygen, type 2 diabetes, chronic pain secondary to right hip pain status post right hip replacement presenting from  home with complaints of acute exacerbation of her chronic right hip pain x2 days with no new injury or trauma.     Patient on arrival appears well, she is known to be afebrile and hemodynamically stable.  Patient has been seen in emergency department multiple times for the same complaint and has not had outpatient follow-up as she has been advised multiple times.   Her right hip has some tenderness to palpation overlying the right greater trochanter, she has had multiple x-rays of the hip and denies any new injury or trauma.  Do not suspect acute bony abnormality such as fracture or need for repeat imaging studies.   Do not suspect septic joint or infectious process.  The right leg is neurovascularly intact distally.  She does have some mild right ankle edema noted, will obtain PVL to rule out DVT and treat symptomatically.      Also of note during patient's examination she was found to have diffuse wheezing on exam, she does have chronic COPD, I offered her a breathing treatment which she is agreeable to.  Diagnostic Studies     Lab:      Recent Results (from the past 12 hour(s))     POC URINE MACROSCOPIC          Collection Time: 07/20/19  1:55 PM         Result  Value  Ref Range            Glucose  Negative  NEGATIVE,Negative mg/dl       Bilirubin  Negative  NEGATIVE,Negative         Ketone  Negative  NEGATIVE,Negative mg/dl       Specific gravity  1.020  1.005 - 1.030         Blood  Trace-lysed (A)  NEGATIVE,Negative         pH (UA)  5.5  5 - 9         Protein  Negative  NEGATIVE,Negative mg/dl       Urobilinogen  0.2  0.0 - 1.0 EU/dl       Nitrites  Negative  NEGATIVE,Negative         Leukocyte Esterase  Negative  NEGATIVE,Negative         Color  Yellow               Appearance  Clear             Labs Reviewed       POC URINE MACROSCOPIC - Abnormal; Notable for the following  components:            Result  Value            Blood  Trace-lysed (*)            All other components within normal limits           Imaging:     Xr Chest Sngl V      Result Date: 07/20/2019   Examination:  Frontal view of the chest. INDICATION: Coughing, shortness of breath COMPARISON: Chest radiograph dated 06/01/2019.       Impression: Scattered bilateral blebs and bullae, most pronounced within the left upper lung zone manifesting as prominent lucency within this region. Stable densities within the left midlung zone, suggesting a combination of known lung nodules and densities.  No acute abnormalities. Findings similar to the previous exam.        Clarene Reamer      Technician      Vascular Lab      Progress Notes      Signed      Date of Service:  07/20/19 1512                                             []  Hide copied text      [] Hover for details     PVL RLEV PRELIM:   ??   1.  No evidence of DVT within the right lower extremity veins assessed.   2.  Patent contralateral common femoral vein with no evidence of DVT.   3.  Normal venous signals throughout.   ??   Final report to follow.   ??   Carolynn Serve, RVT                     ED Course/Medical Decision Making     Patient given a DuoNeb breathing treatment here,  she is kept on her 2 L nasal cannula during her time in the emergency department as she uses at home with oxygen  saturations in the upper 90s.  She is in no acute respiratory distress, her wheezes did improve after the breathing treatment.  Her chest x-ray shows findings similar to previous exam with scattered bilateral blebs and findings of COPD as interpreted  by ED attending.  She is given 1 dose of p.o. Decadron.  Patient states her breathing is at her baseline, she does not feel like she is in a COPD exacerbation and she has been hemodynamically stable.       Patient medicated with 1 dose of p.o. Norco here for her chronic right hip pain.  We obtained a PVL the right lower extremity that  was negative for DVT.  Again, her leg is neurovascular intact distally, she has good range of motion and is able to ambulate.   This is chronic pain for the patient, has had xrays and CT scans in the past with no acute findings. We discussed the importance of outpatient follow-up with orthopedic specialist, she is given contact information to Dr. Renaldo Harrison to schedule a follow-up  appointment.  I also coordinated with care management who has initiated home health to come out to patient's home for a PT/OT/nursing evaluation.  She also has a walker at home should she need this for assistance with ambulating.  We discussed with the  patient we do not prescribe narcotics for chronic pain control out of the emergency department.  She is given prescription for Tylenol and Robaxin.  Do not suspect vascular pathology or acute intra-abdominal or pelvic etiology requiring repeat emergent  imaging studies.  Her urinalysis is negative.      Patient to return to the emergency department for new or worsening symptoms such as fever, worsening pain, abdominal pain, numbness or weakness of the extremities or other concerns.  Patient expressed understanding, she is in agreement with this plan.            Medications       albuterol-ipratropium (DUO-NEB) 2.5 MG-0.5 MG/3 ML (3 mL Nebulization Given 07/20/19 1410)     HYDROcodone-acetaminophen (NORCO) 5-325 mg per tablet 1 Tab (1 Tab Oral Given 07/20/19 1410)       dexAMETHasone (DECADRON) tablet 6 mg (6 mg Oral Given 07/20/19 1531)          Final Diagnosis                 ICD-10-CM  ICD-9-CM          1.  Chronic pain of right hip   M25.551  719.45           G89.29  338.29          2.  Chronic obstructive pulmonary disease, unspecified COPD type (HCC)   J44.9  496     3.  Pain and swelling of lower leg, right   M79.661  729.5           M79.89  729.81             Disposition     Home in stable condition            Discharge Medication List as of 07/20/2019  3:42 PM              START taking these  medications          Details  acetaminophen (TYLENOL) 325 mg tablet  Take 2 Tabs by mouth every four (4) hours as needed for Pain., Normal, Disp-20 Tab, R-0               methocarbamoL (Robaxin) 500 mg tablet  Take 1 Tab by mouth four (4) times daily. As needed for muscle pain or spasm, Normal, Disp-20 Tab, R-0               albuterol (PROVENTIL HFA, VENTOLIN HFA, PROAIR HFA) 90 mcg/actuation inhaler  Take 2 Puffs by inhalation every four (4) hours as needed for Wheezing., Normal, Disp-1 Inhaler, R-1                     CONTINUE these medications which have NOT CHANGED          Details        ibuprofen (MOTRIN) 600 mg tablet  Take 1 Tab by mouth every six (6) hours as needed for Pain., Print, Disp-20 Tab, R-0               dilTIAZem CD (CARDIZEM CD) 240 mg ER capsule  Take 1 Cap by mouth daily., Normal, Disp-30 Cap, R-1               losartan (COZAAR) 50 mg tablet  Take 1 Tab by mouth daily., Normal, Disp-30 Tab, R-1               cetirizine (ZYRTEC) 10 mg tablet  Take 1 Tab by mouth daily., Normal, Disp-30 Tab, R-1               fluticasone propionate (FLONASE) 50 mcg/actuation nasal spray  2 Sprays by Both Nostrils route daily., Normal, Disp-1 Bottle, R-1               guaiFENesin ER (MUCINEX) 600 mg ER tablet  Take 1 Tab by mouth every twelve (12) hours., Normal, Disp-20 Tab, R-0               LANTUS SOLOSTAR U-100 INSULIN 100 unit/mL (3 mL) inpn  INJECT 32 UNITS SUBCUTANEOUSLY  NIGHTLY, Normal, Disp-15 mL, R-0               albuterol (ACCUNEB) 1.25 mg/3 mL nebu  USE 1 VIAL IN NEBULIZER EVERY 6 HOURS AS NEEDED FOR  SHORTNESS  OF  BREATH,  WHEEZING, Normal, Disp-270 mL, R-1               cloNIDine HCl (CATAPRES) 0.1 mg tablet  Take 3 Tabs by mouth two (2) times a day., Normal, Disp-60 Tab, R-1               metFORMIN ER (GLUCOPHAGE XR) 500 mg tablet  TAKE 1 TABLET TWICE DAILY, Normal, Disp-180 Tab, R-1               montelukast (SINGULAIR) 10 mg tablet  TAKE 1 TABLET EVERY DAY, Normal, Disp-90 Tab, R-3                gabapentin (NEURONTIN) 300 mg capsule  Take 1 Cap by mouth three (3) times daily. Max Daily Amount: 900 mg., Print, Disp-270 Cap, R-1               polyethylene glycol (MIRALAX) 17 gram packet  Take 1 Packet by mouth daily., Normal, Disp-30 Packet, R-1               magnesium oxide (MAG-OX) 400 mg tablet  Take 400 mg  by mouth daily., Historical Med               QUEtiapine SR (SEROQUEL XR) 150 mg sr tablet  Take 150 mg by mouth nightly as needed., Historical Med               tiotropium bromide (SPIRIVA RESPIMAT) 2.5 mcg/actuation inhaler  Take 1 Puff by inhalation daily., Normal, Disp-1 Inhaler, R-0               fluticasone propion-salmeterol (ADVAIR DISKUS) 100-50 mcg/dose diskus inhaler  Take 1 Puff by inhalation two (2) times a day., Normal, Disp-1 Inhaler, R-0               rosuvastatin (CRESTOR) 5 mg tablet  TAKE 1 TABLET BY MOUTH NIGHTLY, Normal, Disp-90 Tab, R-1                      Fonnie Mu, PA-C   July 20, 2019   3:16 PM      The patient was personally evaluated by myself and Dr. Arvella Merles, Wetzel Bjornstad, MD who agrees with the above assessment and plan.   Please note that portions of this note were completed with a voice recognition program. Efforts were made to edit the dictations but occasionally words are mis-transcribed.    My signature above authenticates this document and my orders, the final ??   diagnosis (es), discharge prescription (s), and instructions in the Epic ??   record.   If you have any questions please contact [phone].   ??   Nursing notes have been reviewed by the physician/ advanced practice ??   Clinician.

## 2019-07-20 NOTE — ED Notes (Signed)
Patient arrived SOB, states its from the pain. Also reports she wears oxygen at home but does not know how much she wears. States her husband does it for her.

## 2019-07-20 NOTE — Progress Notes (Signed)
At request of m.d. placed order for h.h  - pt . ot and nursing. Called and conformed with comfort care who client had in the past.

## 2019-07-20 NOTE — ED Notes (Signed)
PT reports 7/10 pain in right side groin. Denies dysuria, denies hematuria.

## 2019-07-20 NOTE — Progress Notes (Signed)
[facility]  Face to Face Documentation for Home Health Care      Patient???s Name: [name] [name]    Date of Birth: 15-May-1951    Date of Face to Face:  07/20/2019                                    Face to Face Encounter findings are related to primary reason for home care:   yes    I certify that this patient is under my care and has a Face to Face Encounter related to the primary reason for home health.    1. I certify that the patient needs intermittent skilled nursing care, physical therapy and/or speech therapy.     2. I certify that this patient is homebound for the following reason(s): Requires considerable and taxing effort to leave the home     3. 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

## 2019-07-24 ENCOUNTER — Encounter: Attending: Urology | Primary: Physician Assistant

## 2019-07-24 ENCOUNTER — Encounter: Payer: PRIVATE HEALTH INSURANCE | Attending: Urology | Primary: Physician Assistant
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
## 2019-08-01 ENCOUNTER — Encounter: Payer: MEDICARE | Attending: Orthopaedic Surgery | Primary: Physician Assistant

## 2019-08-22 ENCOUNTER — Inpatient Hospital Stay
Admit: 2019-08-22 | Discharge: 2019-08-22 | Disposition: A | Discharge status: Home / Self Care | Payer: MEDICARE | Attending: Emergency Medicine

## 2019-08-22 ENCOUNTER — Ambulatory Visit: Payer: MEDICARE | Attending: Orthopaedic Surgery | Primary: Physician Assistant

## 2019-08-22 ENCOUNTER — Emergency Department: Admit: 2019-08-22 | Payer: MEDICARE | Primary: Physician Assistant

## 2019-08-22 DIAGNOSIS — R509 Fever, unspecified: Secondary | ICD-10-CM

## 2019-08-22 LAB — URINALYSIS W/ RFLX MICROSCOPIC
Bilirubin, Urine: NEGATIVE
Bilirubin: NEGATIVE
Glucose, Ur: 250 mg/dl — AB
Glucose: 250 mg/dl — AB
Ketone: NEGATIVE mg/dl
Ketones, Urine: NEGATIVE mg/dl
Leukocyte Esterase, Urine: NEGATIVE
Leukocyte Esterase: NEGATIVE
Nitrite, Urine: NEGATIVE
Nitrites: NEGATIVE
Protein, UA: 100 mg/dl — AB
Protein: 100 mg/dl — AB
Specific Gravity, UA: 1.025 (ref 1.005–1.030)
Specific gravity: 1.025 (ref 1.005–1.030)
Urobilinogen, UA, POCT: 0.2 mg/dl (ref 0.0–1.0)
Urobilinogen: 0.2 mg/dl (ref 0.0–1.0)
pH (UA): 6 (ref 5.0–9.0)
pH, UA: 6 (ref 5.0–9.0)

## 2019-08-22 LAB — EKG 12-LEAD
Atrial Rate: 96 {beats}/min
P Axis: 78 degrees
P-R Interval: 214 ms
Q-T Interval: 346 ms
QRS Duration: 84 ms
QTc Calculation (Bazett): 437 ms
R Axis: 10 degrees
T Axis: -5 degrees
Ventricular Rate: 96 {beats}/min

## 2019-08-22 LAB — CBC WITH AUTO DIFFERENTIAL
Basophils %: 0.4 % (ref 0–3)
Eosinophils %: 0.3 % (ref 0–5)
Hematocrit: 39.1 % (ref 37.0–50.0)
Hemoglobin: 12.5 gm/dl — ABNORMAL LOW (ref 13.0–17.2)
Immature Granulocytes: 0.7 % (ref 0.0–3.0)
Lymphocytes %: 14.5 % — ABNORMAL LOW (ref 28–48)
MCH: 28.9 pg (ref 25.4–34.6)
MCHC: 32 gm/dl (ref 30.0–36.0)
MCV: 90.3 fL (ref 80.0–98.0)
MPV: 11.3 fL — ABNORMAL HIGH (ref 6.0–10.0)
Monocytes %: 6.6 % (ref 1–13)
Neutrophils %: 77.5 % — ABNORMAL HIGH (ref 34–64)
Nucleated RBCs: 0 (ref 0–0)
Platelets: 225 10*3/uL (ref 140–450)
RBC: 4.33 M/uL (ref 3.60–5.20)
RDW-SD: 41.4 (ref 36.4–46.3)
WBC: 15.8 10*3/uL — ABNORMAL HIGH (ref 4.0–11.0)

## 2019-08-22 LAB — TROPONIN: Troponin I: <0.015 ng/ml (ref 0.000–0.045)

## 2019-08-22 LAB — COMPREHENSIVE METABOLIC PANEL
ALT: 16 U/L (ref 12–78)
AST: 12 U/L — ABNORMAL LOW (ref 15–37)
Albumin: 3.7 gm/dl (ref 3.4–5.0)
Alkaline Phosphatase: 89 U/L (ref 45–117)
Anion Gap: 4 mmol/L — ABNORMAL LOW (ref 5–15)
BUN: 8 mg/dl (ref 7–25)
CO2: 39 mEq/L — ABNORMAL HIGH (ref 21–32)
Calcium: 8.6 mg/dl (ref 8.5–10.1)
Chloride: 96 mEq/L — ABNORMAL LOW (ref 98–107)
Creatinine: 1 mg/dl (ref 0.6–1.3)
EGFR IF NonAfrican American: 59
GFR African American: >60
Glucose: 254 mg/dl — ABNORMAL HIGH (ref 74–106)
Potassium: 3.2 mEq/L — ABNORMAL LOW (ref 3.5–5.1)
Sodium: 138 mEq/L (ref 136–145)
Total Bilirubin: 0.6 mg/dl (ref 0.2–1.0)
Total Protein: 8.1 gm/dl (ref 6.4–8.2)

## 2019-08-22 LAB — PROBNP, N-TERMINAL: BNP: 644 pg/ml — ABNORMAL HIGH (ref 0.0–125.0)

## 2019-08-22 LAB — POC URINE MICROSCOPIC

## 2019-08-22 LAB — PROCALCITONIN
PROCALCITONIN: 0.05 ng/ml (ref 0.00–0.50)
PROCALCITONIN: 0.05 ng/ml (ref 0.00–0.50)

## 2019-08-22 LAB — POC URINE MACROSCOPIC
Bilirubin, Urine: NEGATIVE
Bilirubin: NEGATIVE
Glucose, Ur: 250 mg/dl — AB
Glucose: 250 mg/dl — AB
Ketone: NEGATIVE mg/dl
Ketones, Urine: NEGATIVE mg/dl
Nitrite, Urine: NEGATIVE
Nitrites: NEGATIVE
Protein, UA: >=300 mg/dl — AB
Protein: >=300 mg/dl — AB
Specific Gravity, UA: >=1.03 (ref 1.005–1.030)
Specific gravity: >=1.03 (ref 1.005–1.030)
Urobilinogen, UA, POCT: 0.2 EU/dl (ref 0.0–1.0)
Urobilinogen: 0.2 EU/dl (ref 0.0–1.0)
pH (UA): 6 (ref 5–9)
pH, UA: 6 (ref 5–9)

## 2019-08-22 LAB — LACTIC ACID
LACTIC ACID: 0.7 mmol/L (ref 0.4–2.0)
Lactic Acid: 0.7 mmol/L (ref 0.4–2.0)

## 2019-08-22 LAB — METABOLIC PANEL, COMPREHENSIVE
ALT (SGPT): 16 U/L (ref 12–78)
AST (SGOT): 12 U/L — ABNORMAL LOW (ref 15–37)
Albumin: 3.7 gm/dl (ref 3.4–5.0)
Alk. phosphatase: 89 U/L (ref 45–117)
Anion gap: 4 mmol/L — ABNORMAL LOW (ref 5–15)
BUN: 8 mg/dl (ref 7–25)
Bilirubin, total: 0.6 mg/dl (ref 0.2–1.0)
CO2: 39 mEq/L — ABNORMAL HIGH (ref 21–32)
Calcium: 8.6 mg/dl (ref 8.5–10.1)
Chloride: 96 mEq/L — ABNORMAL LOW (ref 98–107)
Creatinine: 1 mg/dl (ref 0.6–1.3)
GFR est AA: >60
GFR est non-AA: 59
Glucose: 254 mg/dl — ABNORMAL HIGH (ref 74–106)
Potassium: 3.2 mEq/L — ABNORMAL LOW (ref 3.5–5.1)
Protein, total: 8.1 gm/dl (ref 6.4–8.2)
Sodium: 138 mEq/L (ref 136–145)

## 2019-08-22 LAB — CBC WITH AUTOMATED DIFF
BASOPHILS: 0.4 % (ref 0–3)
EOSINOPHILS: 0.3 % (ref 0–5)
HCT: 39.1 % (ref 37.0–50.0)
HGB: 12.5 gm/dl — ABNORMAL LOW (ref 13.0–17.2)
IMMATURE GRANULOCYTES: 0.7 % (ref 0.0–3.0)
LYMPHOCYTES: 14.5 % — ABNORMAL LOW (ref 28–48)
MCH: 28.9 pg (ref 25.4–34.6)
MCHC: 32 gm/dl (ref 30.0–36.0)
MCV: 90.3 fL (ref 80.0–98.0)
MONOCYTES: 6.6 % (ref 1–13)
MPV: 11.3 fL — ABNORMAL HIGH (ref 6.0–10.0)
NEUTROPHILS: 77.5 % — ABNORMAL HIGH (ref 34–64)
NRBC: 0 (ref 0–0)
PLATELET: 225 10*3/uL (ref 140–450)
RBC: 4.33 M/uL (ref 3.60–5.20)
RDW-SD: 41.4 (ref 36.4–46.3)
WBC: 15.8 10*3/uL — ABNORMAL HIGH (ref 4.0–11.0)

## 2019-08-22 LAB — EKG, 12 LEAD, INITIAL
Atrial Rate: 96 {beats}/min
Calculated P Axis: 78 degrees
Calculated R Axis: 10 degrees
Calculated T Axis: -5 degrees
P-R Interval: 214 ms
Q-T Interval: 346 ms
QRS Duration: 84 ms
QTC Calculation (Bezet): 437 ms
Ventricular Rate: 96 {beats}/min

## 2019-08-22 LAB — SARS-COV-2_FLU_RSV
COVID-19: NEGATIVE
Influenza A PCR: NEGATIVE
Influenza B PCR: NEGATIVE
RSV by PCR: NEGATIVE

## 2019-08-22 LAB — NT-PRO BNP: NT pro-BNP: 644 pg/ml — ABNORMAL HIGH (ref 0.0–125.0)

## 2019-08-22 LAB — TROPONIN I: Troponin-I: <0.015 ng/ml (ref 0.000–0.045)

## 2019-08-22 MED ORDER — ACETAMINOPHEN 325 MG TABLET
325 mg | ORAL | Status: AC
Start: 2019-08-22 — End: 2019-08-22
  Administered 2019-08-22: 17:00:00 via ORAL

## 2019-08-22 MED ORDER — ACETAMINOPHEN 325 MG TABLET
325 mg | ORAL | Status: DC
Start: 2019-08-22 — End: 2019-08-22

## 2019-08-22 MED ORDER — KETOROLAC TROMETHAMINE 30 MG/ML INJECTION
30 mg/mL (1 mL) | INTRAMUSCULAR | Status: AC
Start: 2019-08-22 — End: 2019-08-22
  Administered 2019-08-22: 20:00:00 via INTRAVENOUS

## 2019-08-22 MED ORDER — OXYCODONE-ACETAMINOPHEN 5 MG-325 MG TAB
5-325 mg | ORAL | Status: AC
Start: 2019-08-22 — End: 2019-08-22
  Administered 2019-08-22: 17:00:00 via ORAL

## 2019-08-22 MED ORDER — POTASSIUM BICARBONATE-CITRIC ACID 25 MEQ EFFERVESCENT TAB
25 mEq | ORAL | Status: AC
Start: 2019-08-22 — End: 2019-08-22
  Administered 2019-08-22: 19:00:00 via ORAL

## 2019-08-22 MED ORDER — CEPHALEXIN 500 MG CAP
500 mg | ORAL | Status: AC
Start: 2019-08-22 — End: 2019-08-22
  Administered 2019-08-22: 20:00:00 via ORAL

## 2019-08-22 MED ORDER — CEPHALEXIN 500 MG CAP
500 mg | ORAL_CAPSULE | Freq: Four times a day (QID) | ORAL | 0 refills | Status: AC
Start: 2019-08-22 — End: 2019-08-29

## 2019-08-22 MED ORDER — SODIUM CHLORIDE 0.9% BOLUS IV
0.9 % | INTRAVENOUS | Status: AC
Start: 2019-08-22 — End: 2019-08-22
  Administered 2019-08-22: 18:00:00 via INTRAVENOUS

## 2019-08-22 MED FILL — KETOROLAC TROMETHAMINE 30 MG/ML INJECTION: 30 mg/mL (1 mL) | INTRAMUSCULAR | Qty: 1

## 2019-08-22 MED FILL — CEPHALEXIN 500 MG CAP: 500 mg | ORAL | Qty: 1

## 2019-08-22 MED FILL — TYLENOL 325 MG TABLET: 325 mg | ORAL | Qty: 2

## 2019-08-22 MED FILL — POTASSIUM BICARBONATE-CITRIC ACID 25 MEQ EFFERVESCENT TAB: 25 mEq | ORAL | Qty: 1

## 2019-08-22 MED FILL — TYLENOL 325 MG TABLET: 325 mg | ORAL | Qty: 3

## 2019-08-22 MED FILL — OXYCODONE-ACETAMINOPHEN 5 MG-325 MG TAB: 5-325 mg | ORAL | Qty: 1

## 2019-08-22 NOTE — ED Notes (Signed)
Pt placed on Purewick per her request

## 2019-08-22 NOTE — ED Triage Notes (Signed)
Pt comes in for multiple complains- leg swelling, urinary frequency, hip pain that all started yesterday   02 in triage in 70's - pt then states she is supposed to be on home o2 but didn't bring it. Pt placed on 6l NC in triage and came up to 91%.    Provided pt with education about wearing o2 anytime she leaves the house- pt states she didn't know she was supposed to do that,

## 2019-08-22 NOTE — ED Provider Notes (Signed)
[facility]  Emergency Department Treatment Report        Patient: [name] [name] Age: 69 y.o. Sex: female    Date of Birth: 1950/09/10 Admit Date: 08/22/2019 PCP: Ernst Breach   MRN: [id number]  CSN: [id number]     Room: ER05/ER05 Time Dictated: 11:36 AM      Attending MD: Oneita Hurt, MD  APC:  Maryanna Shape PA-C    Chief Complaint   Fever, chills, headache, urinary frequency, leg swelling    History of Present Illness   69 y.o. female with a history of diabetes, COPD on 3 L nasal cannula oxygen at home presents to the emergency department for evaluation of symptoms that started yesterday.  Patient states she has had some frontal headache, chills, feverishness, body aches, leg swelling, notes that she is urinating frequently.  She is also having left hip pain which is a chronic problem, no trauma or falls.      She denies any Covid contacts, Covid exposure, no history of COVID-19, no portion of COVID-19 vaccine has been given    Review of Systems   Review of Systems   Constitutional: Positive for chills, fever and malaise/fatigue.   HENT: Negative for congestion and sore throat.    Eyes:        Eyes are watering, no eye pain, no eye redness   Respiratory: Negative for cough and shortness of breath.    Cardiovascular: Negative for chest pain and palpitations.   Gastrointestinal: Positive for nausea. Negative for abdominal pain, diarrhea and vomiting.   Genitourinary: Positive for frequency. Negative for dysuria, hematuria and urgency.   Musculoskeletal: Positive for joint pain (left hip pain, chronic). Negative for myalgias.   Skin: Negative for rash.   Neurological: Negative for speech change and headaches.   Psychiatric/Behavioral: The patient is not nervous/anxious.      Past Medical/Surgical History     Past Medical History:   Diagnosis Date   ??? Arthritis    ??? 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 COPD (chronic obstructive pulmonary disease) (HCC)     [place]  Emphysema on CT 02/2018   ??? DM2 (diabetes mellitus, type 2) ([place])    ??? GERD (gastroesophageal reflux disease)    ??? Hepatitis C     HEP C   ??? HTN (hypertension)    ??? Lung nodule, multiple     (CT 10/2016) Small left upper lobe and 1.7 x 1.6 cm left lower lobe nodules not significantly changed from 07/23/2016 and 03/22/2016   ??? Marijuana use    ??? PTSD (post-traumatic stress disorder)     lived through the [place] in 1993   ??? Thoracic ascending aortic aneurysm ([place])     4.4cm noted on CT Chest (10/2016)   ??? Thyroid nodule     2.5 cm stable right thyroid nodule (CT 10/2016)     Past Surgical History:   Procedure Laterality Date   ??? HX ENDOSCOPY     ??? HX HERNIA REPAIR  2019    x2   ??? HX HIP REPLACEMENT Right 01/29/2015   ??? HX HIP REPLACEMENT     ??? HX HYSTERECTOMY  2010    hysterectomy     Social History     Social History     Socioeconomic History   ??? Marital status: MARRIED     Spouse name: Not on file   ??? Number of children:  Not on file   ??? Years of education: Not on file   ??? Highest education level: Not on file   Occupational History   ??? Not on file   Social Needs   ??? Financial resource strain: Not on file   ??? Food insecurity     Worry: Not on file     Inability: Not on file   ??? Transportation needs     Medical: Not on file     Non-medical: Not on file   Tobacco Use   ??? Smoking status: Former Smoker   ??? Smokeless tobacco: Never Used   ??? Tobacco comment: reported as quit smoking around 1990s per spouse   Substance and Sexual Activity   ??? Alcohol use: No   ??? Drug use: Not Currently     Types: Marijuana     Comment: states she has not used marijuana in months   ??? Sexual activity: Never   Lifestyle   ??? Physical activity     Days per week: Not on file     Minutes per session: Not on file   ??? Stress: Not on file   Relationships   ??? Social Product manager on phone: Not on file     Gets together: Not on file     Attends religious service: Not on file     Active member of club or organization: Not on file      Attends meetings of clubs or organizations: Not on file     Relationship status: Not on file   ??? Intimate partner violence     Fear of current or ex partner: Not on file     Emotionally abused: Not on file     Physically abused: Not on file     Forced sexual activity: Not on file   Other Topics Concern   ??? Not on file   Social History Narrative   ??? Not on file     Family History     Family History   Problem Relation Age of Onset   ??? Hypertension Mother    ??? Other Mother         OSA   ??? Cancer Father         suspected leukemia per pt's spouse   ??? Cancer Maternal Aunt    ??? Alcohol abuse Maternal Grandfather      Current Medications     Prior to Admission Medications   Prescriptions Last Dose Informant Patient Reported? Taking?   LANTUS SOLOSTAR U-100 INSULIN 100 unit/mL (3 mL) inpn   No No   Sig: INJECT 32 UNITS SUBCUTANEOUSLY  NIGHTLY   QUEtiapine SR (SEROQUEL XR) 150 mg sr tablet   Yes No   Sig: Take 150 mg by mouth nightly as needed.   acetaminophen (TYLENOL) 325 mg tablet   No No   Sig: Take 2 Tabs by mouth every four (4) hours as needed for Pain.   albuterol (ACCUNEB) 1.25 mg/3 mL nebu   No No   Sig: USE 1 VIAL IN NEBULIZER EVERY 6 HOURS AS NEEDED FOR  SHORTNESS  OF  BREATH,  WHEEZING   albuterol (PROVENTIL HFA, VENTOLIN HFA, PROAIR HFA) 90 mcg/actuation inhaler   No No   Sig: Take 2 Puffs by inhalation every four (4) hours as needed for Wheezing.   cetirizine (ZYRTEC) 10 mg tablet   No No   Sig: Take 1 Tab by mouth daily.   cloNIDine HCl (CATAPRES)  0.1 mg tablet   No No   Sig: Take 3 Tabs by mouth two (2) times a day.   dilTIAZem CD (CARDIZEM CD) 240 mg ER capsule   No No   Sig: Take 1 Cap by mouth daily.   fluticasone propion-salmeterol (ADVAIR DISKUS) 100-50 mcg/dose diskus inhaler   No No   Sig: Take 1 Puff by inhalation two (2) times a day.   fluticasone propionate (FLONASE) 50 mcg/actuation nasal spray   No No   Sig: 2 Sprays by Both Nostrils route daily.   Patient taking differently: 2 Sprays by Both  Nostrils route daily as needed.   gabapentin (NEURONTIN) 300 mg capsule   No No   Sig: Take 1 Cap by mouth three (3) times daily. Max Daily Amount: 900 mg.   guaiFENesin ER (MUCINEX) 600 mg ER tablet   No No   Sig: Take 1 Tab by mouth every twelve (12) hours.   Patient taking differently: Take 600 mg by mouth daily as needed.   ibuprofen (MOTRIN) 600 mg tablet   No No   Sig: Take 1 Tab by mouth every six (6) hours as needed for Pain.   losartan (COZAAR) 50 mg tablet   No No   Sig: Take 1 Tab by mouth daily.   magnesium oxide (MAG-OX) 400 mg tablet   Yes No   Sig: Take 400 mg by mouth daily.   metFORMIN ER (GLUCOPHAGE XR) 500 mg tablet   No No   Sig: TAKE 1 TABLET TWICE DAILY   methocarbamoL (Robaxin) 500 mg tablet   No No   Sig: Take 1 Tab by mouth four (4) times daily. As needed for muscle pain or spasm   montelukast (SINGULAIR) 10 mg tablet   No No   Sig: TAKE 1 TABLET EVERY DAY   polyethylene glycol (MIRALAX) 17 gram packet   No No   Sig: Take 1 Packet by mouth daily.   Patient taking differently: Take 17 g by mouth daily as needed for Constipation.   rosuvastatin (CRESTOR) 5 mg tablet   No No   Sig: TAKE 1 TABLET BY MOUTH NIGHTLY   tiotropium bromide (SPIRIVA RESPIMAT) 2.5 mcg/actuation inhaler   No No   Sig: Take 1 Puff by inhalation daily.      Facility-Administered Medications: None       Allergies     Allergies   Allergen Reactions   ??? Chocolate [Cocoa] Sneezing     Confirms allergy but reports "I still eat it"     Physical Exam     ED Triage Vitals [08/22/19 1055]   ED Encounter Vitals Group      BP (!) 151/96      Pulse (Heart Rate) (!) 103      Resp Rate 20      Temp (!) 100.9 ??F (38.3 ??C)      Temp src       O2 Sat (%) (!) 70 %      Weight 181 lb      Height 5' 8"      Physical Exam  Constitutional:       General: She is not in acute distress.     Appearance: She is obese. She is ill-appearing. She is not toxic-appearing.   HENT:      Head: Normocephalic and atraumatic.      Mouth/Throat:      Pharynx:  Oropharynx is clear.   Eyes:      General: No scleral  icterus.     Extraocular Movements: Extraocular movements intact.      Conjunctiva/sclera: Conjunctivae normal.   Neck:      Musculoskeletal: Normal range of motion and neck supple.   Cardiovascular:      Rate and Rhythm: Normal rate and regular rhythm.      Pulses: Normal pulses.   Pulmonary:      Effort: Pulmonary effort is normal.      Breath sounds: Normal breath sounds.   Abdominal:      General: Abdomen is flat.      Palpations: Abdomen is soft.      Tenderness: There is no abdominal tenderness. There is no guarding or rebound.   Musculoskeletal: Normal range of motion.      Right lower leg: Edema present.      Left lower leg: Edema present.      Comments: 1 + pitting, no calf tenderness, no erythema  Left hip is NTTP, ROM is normal.  No rash over the left leg, no cellulitis, no yeast dermatitis   Skin:     General: Skin is warm and dry.      Capillary Refill: Capillary refill takes less than 2 seconds.      Findings: No rash.   Neurological:      General: No focal deficit present.      Mental Status: She is alert and oriented to person, place, and time.   Psychiatric:         Mood and Affect: Mood normal.       Impression and Management Plan     Patient presenting to the emergency department with fever, chills, aches, urinary frequency.  Consider UTI, other infectious etiology, COVID-19.  She has lower extremity edema, consider CHF or DVT, renal insufficiency.  Will check labs to look at white count, send lactic and procalcitonin to ensure not septic.  Will start gentle hydration, obtain chest x-ray.  Patient was significantly hypoxic at triage but was not on her home oxygen.  For me on 3 L she was stable above 95%.  This is not an increased oxygen requirement from home.  Hip pain is chronic.  I don't see cellulitis or zoster changes, no yeast, ROM WNL, don't suspect a septic joint.  Review of records reveals that patient complains of hip pain chronically  R & L.    Diagnostic Studies   Lab:   Recent Results (from the past 12 hour(s))   URINALYSIS W/ RFLX MICROSCOPIC    Collection Time: 08/22/19 11:41 AM   Result Value Ref Range    Color DARK YELLOW (A) YELLOW,STRAW      Appearance HAZY (A) CLEAR      Glucose 250 (A) NEGATIVE,Negative mg/dl    Bilirubin NEGATIVE NEGATIVE,Negative      Ketone NEGATIVE NEGATIVE,Negative mg/dl    Specific gravity 1.025 1.005 - 1.030      Blood MODERATE (A) NEGATIVE,Negative      pH (UA) 6.0 5.0 - 9.0      Protein 100 (A) NEGATIVE,Negative mg/dl    Urobilinogen 0.2 0.0 - 1.0 mg/dl    Nitrites NEGATIVE NEGATIVE,Negative      Leukocyte Esterase NEGATIVE NEGATIVE,Negative     SARS-COV-2_FLU_RSV    Collection Time: 08/22/19 11:41 AM    Specimen: NASOPHARYNGEAL SWAB   Result Value Ref Range    COVID-19 NEGATIVE NEGATIVE      Influenza A PCR NEGATIVE NEGATIVE      Influenza B PCR NEGATIVE NEGATIVE  RSV by PCR NEGATIVE NEGATIVE     POC URINE MICROSCOPIC    Collection Time: 08/22/19 11:41 AM   Result Value Ref Range    WBC 5-9 /HPF    RBC 1-4 /HPF   CBC WITH AUTOMATED DIFF    Collection Time: 08/22/19  1:04 PM   Result Value Ref Range    WBC 15.8 (H) 4.0 - 11.0 1000/mm3    RBC 4.33 3.60 - 5.20 M/uL    HGB 12.5 (L) 13.0 - 17.2 gm/dl    HCT 39.1 37.0 - 50.0 %    MCV 90.3 80.0 - 98.0 fL    MCH 28.9 25.4 - 34.6 pg    MCHC 32.0 30.0 - 36.0 gm/dl    PLATELET 225 140 - 450 1000/mm3    MPV 11.3 (H) 6.0 - 10.0 fL    RDW-SD 41.4 36.4 - 46.3      NRBC 0 0 - 0      IMMATURE GRANULOCYTES 0.7 0.0 - 3.0 %    NEUTROPHILS 77.5 (H) 34 - 64 %    LYMPHOCYTES 14.5 (L) 28 - 48 %    MONOCYTES 6.6 1 - 13 %    EOSINOPHILS 0.3 0 - 5 %    BASOPHILS 0.4 0 - 3 %   METABOLIC PANEL, COMPREHENSIVE    Collection Time: 08/22/19  1:04 PM   Result Value Ref Range    Sodium 138 136 - 145 mEq/L    Potassium 3.2 (L) 3.5 - 5.1 mEq/L    Chloride 96 (L) 98 - 107 mEq/L    CO2 39 (H) 21 - 32 mEq/L    Glucose 254 (H) 74 - 106 mg/dl    BUN 8 7 - 25 mg/dl    Creatinine 1.0 0.6 - 1.3 mg/dl     GFR est AA >60.0      GFR est non-AA 59      Calcium 8.6 8.5 - 10.1 mg/dl    AST (SGOT) 12 (L) 15 - 37 U/L    ALT (SGPT) 16 12 - 78 U/L    Alk. phosphatase 89 45 - 117 U/L    Bilirubin, total 0.6 0.2 - 1.0 mg/dl    Protein, total 8.1 6.4 - 8.2 gm/dl    Albumin 3.7 3.4 - 5.0 gm/dl    Anion gap 4 (L) 5 - 15 mmol/L   LACTIC ACID    Collection Time: 08/22/19  1:04 PM   Result Value Ref Range    Lactic Acid 0.7 0.4 - 2.0 mmol/L   PROCALCITONIN    Collection Time: 08/22/19  1:04 PM   Result Value Ref Range    PROCALCITONIN 0.05 0.00 - 0.50 ng/ml   TROPONIN I    Collection Time: 08/22/19  1:04 PM   Result Value Ref Range    Troponin-I <0.015 0.000 - 0.045 ng/ml   NT-PRO BNP    Collection Time: 08/22/19  1:04 PM   Result Value Ref Range    NT pro-BNP 644.0 (H) 0.0 - 125.0 pg/ml     Labs Reviewed   CBC WITH AUTOMATED DIFF - Abnormal; Notable for the following components:       Result Value    WBC 15.8 (*)     HGB 12.5 (*)     MPV 11.3 (*)     NEUTROPHILS 77.5 (*)     LYMPHOCYTES 14.5 (*)     All other components within normal limits   METABOLIC PANEL, COMPREHENSIVE - Abnormal; Notable for  the following components:    Potassium 3.2 (*)     Chloride 96 (*)     CO2 39 (*)     Glucose 254 (*)     AST (SGOT) 12 (*)     Anion gap 4 (*)     All other components within normal limits   URINALYSIS W/ RFLX MICROSCOPIC - Abnormal; Notable for the following components:    Color DARK YELLOW (*)     Appearance HAZY (*)     Glucose 250 (*)     Blood MODERATE (*)     Protein 100 (*)     All other components within normal limits   NT-PRO BNP - Abnormal; Notable for the following components:    NT pro-BNP 644.0 (*)     All other components within normal limits   CULTURE, BLOOD   CULTURE, URINE   SARS-COV-2_FLU_RSV   CULTURE, BLOOD   CULTURE, RESPIRATORY/SPUTUM/BRONCH W GRAM STAIN   LACTIC ACID   PROCALCITONIN   TROPONIN I   LACTIC ACID   POC URINE MICROSCOPIC       EKG interpretation by [facility], Orlando Penner, MD in ED course.     Imaging:     Xr Chest Sngl V    Result Date: 08/22/2019  INDICATION: sepsis EXAMINATION: XR CHEST SNGL V COMPARISON: 07/20/2019 FINDINGS: The study shows a normal sized heart.. Unchanged oval opacity at the left mid lung zone measuring 7 cm x 3.9 cm.. Pleural density at the left lung base. Prominence of the right hilum.     IMPRESSION: 1. Unchanged oval opacity at the left mid lung zone measuring 7 x 3.9 cm. 2. Pleural thickening at the left lung base. 3. Prominence of the right hilum.     PVL report:   ??  No evidence of deep vein thrombosis in the bilateral lower extremities as described.  ??  Final Report to follow.  ??  Einar Gip, RVT  Vascular Lab    ED Course     Patient was maintained on cardiac, oxygen, and blood pressure monitoring without arrhythmia or event.  BP elevated, no tachycardia or hypoxia.  Temperature improved.    Patient Vitals for the past 12 hrs:   Temp Pulse Resp BP SpO2   08/22/19 1416 ??? 94 15 (!) 171/109 96 %   08/22/19 1358 99.1 ??F (37.3 ??C) ??? ??? ??? ???   08/22/19 1358 ??? 93 13 ??? 96 %   08/22/19 1316 ??? 95 18 (!) 170/109 96 %   08/22/19 1237 ??? 97 13 (!) 165/101 98 %   08/22/19 1121 ??? 97 17 (!) 148/66 96 %   08/22/19 1118 ??? 96 17 (!) 148/66 97 %   08/22/19 1118 ??? 95 22 ??? 94 %   08/22/19 1055 (!) 100.9 ??F (38.3 ??C) (!) 103 20 (!) 151/96 (!) 70 %       ED Course as of Aug 22 1450   Thu Aug 22, 2019   1124 While in room patient on [facility]-- oxygen satruations 100% .  At home on 3L NC-- chagned to 3L, advised RN to let me know if patient requiring more oxygen.    [EI]   1133 Reviewed records:  echo 05/2019-- normal EF    Interpretation Summary  A complete two-dimensional transthoracic echocardiogram was performed (2D, M-  mode, Doppler and color flow Doppler).  The study was technically adequate.  The left ventricle is normal in size with proximal septal thickening noted.  Left  ventricular systolic function is normal. Ejection fraction is 63%.  Grade I diastolic dysfunction.  Right ventricle is normal size with  preserved systolic function.  No hemodynamically significant valvular disease noted  Insufficient Tricuspid regurgitation to evaluate for PASP/ RVSP.  ??  No previous echocardiogram available for comparison.    [EI]   1027 Chest x-ray was reviewed- Interpretation by myself and Dr. Hermelinda Medicus = chronic lung disease changes, no acute change from 05/2019    [EI]   1207 Nitrites: NEGATIVE [EI]   1207 Leukocyte Esterase: NEGATIVE [EI]   1207 Blood(!): MODERATE [EI]   1310 COVID-19: NEGATIVE [EI]   1310 WBC: 5-9 [EI]   2536 Patient re-evaluated.  Fluids running.  BP and HR stable on monitor.  Oxygen level stable.  Updated on test results so far.  Labs just sent by PICC.    [EI]   1334 WBC(!): 15.8 [EI]   1335 HGB(!): 12.5 [EI]   1335 HCT: 39.1 [EI]   1352 EKG reviewed with Dr. Hermelinda Medicus, interpretation by ED attending = NSR rate 96 , no acute ST-T wave changes to suggest ischemia.  QTc 437.        [EI]   1358 Lactic Acid: 0.7 [EI]   1358 Troponin-I: <0.015 [EI]   1358 Creatinine: 1.0 [EI]   1358 BUN: 8 [EI]   1358 Glucose(!): 254 [EI]   1358 Will give oral K+   Potassium(!): 3.2 [EI]   1359 Recent normal echo   NT pro-BNP(!): 644.0 [EI]   1359 Temp: 99.1 ??F (37.3 ??C) [EI]   1405 Reviewed results with Dr. Hermelinda Medicus- discussed pending urine and blood cultures- patient with normal lactic acid.  Treat with Keflex as 5-9 WBC, urinary frequency.  Return precautions given.    [EI]   1416 PROCALCITONIN: 0.05 [EI]   1417 Reviewed urine cultures back to 04/2019- they always show skin and genital contamination.  Sent today from a cath urine.    [EI]      ED Course User Index  [EI] Jenelle Mages, PA-C       Medications   oxyCODONE-acetaminophen (PERCOCET) 5-325 mg per tablet 1 Tab (1 Tab Oral Given 08/22/19 1225)   acetaminophen (TYLENOL) tablet 650 mg (650 mg Oral Given 08/22/19 1225)   sodium chloride 0.9 % bolus infusion 500 mL (500 mL IntraVENous New Bag 08/22/19 1315)   potassium bicarbonate (KLYTE) tablet 25 mEq (25 mEq Oral  Given 08/22/19 1400)   ketorolac (TORADOL) injection 15 mg (15 mg IntraVENous Given 08/22/19 1440)   cephALEXin (KEFLEX) capsule 500 mg (500 mg Oral Given 08/22/19 1439)       Medical Decision Making     Patient presenting to the ER with fever, chills, myalgia, nausea.  She has had urinary frequency.  While her urine does not show leukocytes or nitrites she does have 5-9 white cells which is greater than at these.  Her chest x-ray does not show pneumonia.  Her white count is slightly elevated at 15.  She has some mild hypokalemia.  She does not have any x-ray findings of pneumonia.  She has not been complaining of cough or dyspnea.  Testing for COVID-19 and flu are negative.  Unclear etiology of her symptoms at this time but she has been hemodynamically stable while here.  We do not suspect septic joint as she has chronic hip pain, moves the hip without difficulty.  PVLs are negative for DVT.  We are going to treat with urinary frequency symptoms and white cells  in urine as potential UTI.  Urine and blood cultures are pending.  Patient is safe for discharge at this time but she needs to be vigilant about her symptoms and return if she has higher fever, vomiting, dizzy when standing, increased work of breathing.  She is not requiring more oxygen than baseline.    Final Diagnosis       ICD-10-CM ICD-9-CM   1. Fever and chills  R50.9 780.60   2. Myalgia  M79.10 729.1   3. Nausea without vomiting  R11.0 787.02   4. Hypokalemia  E87.6 276.8   5. Acute cystitis without hematuria  N30.00 595.0   6. Chronic left hip pain  M25.552 719.45    G89.29 338.29     Disposition   Patient is discharged home in stable condition, with instructions to follow up with their regular doctor. They are advised to return immediately for any worsening or symptoms of concern.    Follow-up Information     Follow up With Specialties Details Why Contact Info    Wallene Huh, PA-C Physician Assistant Schedule an appointment as soon as possible for a  visit  For follow-up of your symptoms [place] [address]  [phone]      [place] DEPT Emergency Medicine Go to  If symptoms worsen- vomiting, increased difficulty breathing, dizziness when standing [place]  [phone]          Rx keflex    The patient was personally evaluated by myself and [facility], Orlando Penner, MD who agrees with the above assessment and plan.    Jenet Durio E. Gustavus Messing  August 22, 2019    My signature above authenticates this document and my orders, the final    diagnosis (es), discharge prescription (s), and instructions in the Epic    record.  If you have any questions please contact [phone].     Nursing notes have been reviewed by the physician/ advanced practice    Clinician.    Dragon medical dictation software was used for portions of this report. Unintended voice recognition errors may occur.

## 2019-08-22 NOTE — Other (Signed)
LAB    Positive urine culture results reviewed by PA Icenbice. No further treatment is needed at this time

## 2019-08-22 NOTE — Progress Notes (Signed)
Preliminary Report:    No evidence of deep vein thrombosis in the bilateral lower extremities as described.    Final Report to follow.    Jasprina Ming, RVT  Vascular Lab

## 2019-08-22 NOTE — ED Notes (Signed)
Pt requesting more pain meds for her head, notified Erin, PA

## 2019-08-22 NOTE — ED Notes (Signed)
I have reviewed discharge instructions with the patient.  The patient verbalized understanding.

## 2019-08-22 NOTE — ED Notes (Signed)
Assisted pt out to triage to wait for her husband. Pt requested to wait outside for her husband

## 2019-08-22 NOTE — ED Notes (Signed)
Assisted pt out to triage to wait for her husband. Pt requested to wait outside for her husband

## 2019-08-22 NOTE — ED Notes (Signed)
Pt requesting more pain meds for her head, notified [place], [place]

## 2019-08-22 NOTE — ED Notes (Signed)
Pt placed on Purewick per her request

## 2019-08-22 NOTE — ED Notes (Signed)
Pt comes in for multiple complains- leg swelling, urinary frequency, hip pain that all started yesterday   02 in triage in 70's - pt then states she is supposed to be on home o2 but didn't bring it. Pt placed on 6l NC in triage and came up to 91%.    Provided pt with education about wearing o2 anytime she leaves the house- pt states she didn't know she was supposed to do that,

## 2019-08-22 NOTE — ED Provider Notes (Signed)
ED Provider Notes by Jenelle Mages, PA-C at 08/22/19 1136                Author: Jenelle Mages, PA-C  Service: Emergency Medicine  Author Type: Physician Assistant       Filed: 08/22/19 1454  Date of Service: 08/22/19 1136  Status: Attested           Editor: Norris Cross (Physician Assistant)  Cosigner: Oneita Hurt, MD at 08/22/19 1619          Attestation signed by Oneita Hurt, MD at 08/22/19 1619          I interviewed and examined the patient. I discussed with the mid-level provider agree with her evaluation and plan as documented here.I have reviewed and agree  with all pertinent clinical information including history, physical exam, labs, radiographic studies and the plan.  I have also reviewed and agree with the medications, allergies and past medical history sections for this patient.      Patient alert and nontoxic on my evaluation.  I am uncertain of the etiology of the patient's low-grade fever and elevated white blood cell count.    Chest x-ray shows nothing acute.  On my examination patient is alert, pleasant and entirely nontoxic.      She is hemodynamically stable.  I believe at this point it would be reasonable to treat her symptomatically, and discharge her with good return instructions.  Patient agrees with plan, would like to go home.                                    [facility]   Emergency Department Treatment Report                Patient: [name] [name]  Age: 69 y.o.  Sex: female          Date of Birth: Jan 11, 1951  Admit Date: 08/22/2019  PCP: Ernst Breach     MRN: [id number]   CSN: [id number]            Room: ER05/ER05  Time Dictated: 11:36 AM          Attending MD: Oneita Hurt, MD   APC:  Maryanna Shape PA-C      Chief Complaint    Fever, chills, headache, urinary frequency, leg swelling        History of Present Illness     69 y.o. female  with a history of diabetes, COPD on 3 L nasal cannula oxygen at home presents to the  emergency department for evaluation of symptoms that started yesterday.  Patient states she has had some frontal headache, chills, feverishness, body aches, leg swelling,  notes that she is urinating frequently.  She is also having left hip pain which is a chronic problem, no trauma or falls.        She denies any Covid contacts, Covid exposure, no history of COVID-19, no portion of COVID-19 vaccine has been given        Review of Systems     Review of Systems    Constitutional: Positive for chills, fever  and malaise/fatigue.    HENT: Negative for congestion and sore throat.     Eyes:         Eyes are watering, no eye pain, no eye redness    Respiratory: Negative for cough and shortness  of breath.     Cardiovascular: Negative for chest pain and palpitations.    Gastrointestinal: Positive for nausea. Negative for abdominal pain, diarrhea and vomiting.    Genitourinary: Positive for frequency. Negative for dysuria, hematuria and urgency.    Musculoskeletal: Positive for joint pain (left hip pain, chronic) . Negative for myalgias.    Skin: Negative for rash.    Neurological: Negative for speech change and headaches.    Psychiatric/Behavioral: The patient is not nervous/anxious.          Past Medical/Surgical History          Past Medical History:        Diagnosis  Date         ?  Arthritis       ?  Chronic pain syndrome            related to R hip replacement         ?  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  DM2 (diabetes mellitus, type 2) ([place])       ?  GERD (gastroesophageal reflux disease)       ?  Hepatitis C            HEP C         ?  HTN (hypertension)       ?  Lung nodule, multiple            (CT 10/2016) Small left upper lobe and 1.7 x 1.6 cm left lower lobe nodules not significantly changed from 07/23/2016 and 03/22/2016         ?  Marijuana use       ?  PTSD (post-traumatic stress disorder)            lived through the [place] in 1993         ?  Thoracic ascending aortic aneurysm ([place])            4.4cm noted on CT Chest (10/2016)         ?  Thyroid nodule            2.5 cm stable right thyroid nodule (CT 10/2016)          Past Surgical History:         Procedure  Laterality  Date          ?  HX ENDOSCOPY         ?  HX HERNIA REPAIR    2019          x2          ?  HX HIP REPLACEMENT  Right  01/29/2015     ?  HX HIP REPLACEMENT         ?  HX HYSTERECTOMY    2010          hysterectomy          Social History          Social History          Socioeconomic History         ?  Marital status:  MARRIED              Spouse name:  Not on file         ?  Number of children:  Not  on file     ?  Years of education:  Not on file     ?  Highest education level:  Not on file       Occupational History        ?  Not on file       Social Needs         ?  Financial resource strain:  Not on file        ?  Food insecurity              Worry:  Not on file         Inability:  Not on file        ?  Transportation needs              Medical:  Not on file         Non-medical:  Not on file       Tobacco Use         ?  Smoking status:  Former Smoker     ?  Smokeless tobacco:  Never Used        ?  Tobacco comment: reported as quit smoking around 1990s per spouse       Substance and Sexual Activity         ?  Alcohol use:  No     ?  Drug use:  Not Currently              Types:  Marijuana             Comment: states she has not used marijuana in months         ?  Sexual activity:  Never       Lifestyle        ?  Physical activity              Days per week:  Not on file         Minutes per session:  Not on file         ?  Stress:  Not on file       Relationships        ?  Social Health visitor on phone:  Not on file         Gets together:  Not on file         Attends religious service:  Not on file         Active member of club or organization:  Not on file         Attends meetings of clubs or organizations:  Not on file         Relationship  status:  Not on file        ?  Intimate partner violence              Fear of current or ex partner:  Not on file         Emotionally abused:  Not on file         Physically abused:  Not on file         Forced sexual activity:  Not on file        Other Topics  Concern        ?  Not on file       Social History Narrative        ?  Not on file          Family History          Family History         Problem  Relation  Age of Onset          ?  Hypertension  Mother       ?  Other  Mother                OSA          ?  Cancer  Father                suspected leukemia per pt's spouse          ?  Cancer  Maternal Aunt            ?  Alcohol abuse  Maternal Grandfather            Current Medications          Prior to Admission Medications     Prescriptions  Last Dose  Informant  Patient Reported?  Taking?      LANTUS SOLOSTAR U-100 INSULIN 100 unit/mL (3 mL) inpn      No  No      Sig: INJECT 32 UNITS SUBCUTANEOUSLY  NIGHTLY      QUEtiapine SR (SEROQUEL XR) 150 mg sr tablet      Yes  No      Sig: Take 150 mg by mouth nightly as needed.      acetaminophen (TYLENOL) 325 mg tablet      No  No      Sig: Take 2 Tabs by mouth every four (4) hours as needed for Pain.      albuterol (ACCUNEB) 1.25 mg/3 mL nebu      No  No      Sig: USE 1 VIAL IN NEBULIZER EVERY 6 HOURS AS NEEDED FOR  SHORTNESS  OF  BREATH,  WHEEZING      albuterol (PROVENTIL HFA, VENTOLIN HFA, PROAIR HFA) 90 mcg/actuation inhaler      No  No      Sig: Take 2 Puffs by inhalation every four (4) hours as needed for Wheezing.      cetirizine (ZYRTEC) 10 mg tablet      No  No      Sig: Take 1 Tab by mouth daily.      cloNIDine HCl (CATAPRES) 0.1 mg tablet      No  No      Sig: Take 3 Tabs by mouth two (2) times a day.      dilTIAZem CD (CARDIZEM CD) 240 mg ER capsule      No  No      Sig: Take 1 Cap by mouth daily.      fluticasone propion-salmeterol (ADVAIR DISKUS) 100-50 mcg/dose diskus inhaler      No  No      Sig: Take 1 Puff by inhalation two (2) times a day.       fluticasone propionate (FLONASE) 50 mcg/actuation nasal spray      No  No      Sig: 2 Sprays by Both Nostrils route daily.      Patient taking differently: 2 Sprays by Both Nostrils route daily as needed.      gabapentin (NEURONTIN) 300 mg capsule      No  No      Sig: Take 1 Cap by mouth three (3) times daily.  Max Daily Amount: 900 mg.      guaiFENesin ER (MUCINEX) 600 mg ER tablet      No  No      Sig: Take 1 Tab by mouth every twelve (12) hours.      Patient taking differently: Take 600 mg by mouth daily as needed.      ibuprofen (MOTRIN) 600 mg tablet      No  No      Sig: Take 1 Tab by mouth every six (6) hours as needed for Pain.      losartan (COZAAR) 50 mg tablet      No  No      Sig: Take 1 Tab by mouth daily.      magnesium oxide (MAG-OX) 400 mg tablet      Yes  No      Sig: Take 400 mg by mouth daily.      metFORMIN ER (GLUCOPHAGE XR) 500 mg tablet      No  No      Sig: TAKE 1 TABLET TWICE DAILY      methocarbamoL (Robaxin) 500 mg tablet      No  No      Sig: Take 1 Tab by mouth four (4) times daily. As needed for muscle pain or spasm      montelukast (SINGULAIR) 10 mg tablet      No  No      Sig: TAKE 1 TABLET EVERY DAY      polyethylene glycol (MIRALAX) 17 gram packet      No  No      Sig: Take 1 Packet by mouth daily.      Patient taking differently: Take 17 g by mouth daily as needed for Constipation.      rosuvastatin (CRESTOR) 5 mg tablet      No  No      Sig: TAKE 1 TABLET BY MOUTH NIGHTLY      tiotropium bromide (SPIRIVA RESPIMAT) 2.5 mcg/actuation inhaler      No  No      Sig: Take 1 Puff by inhalation daily.               Facility-Administered Medications: None             Allergies          Allergies        Allergen  Reactions         ?  Chocolate [Cocoa]  Sneezing             Confirms allergy but reports "I still eat it"          Physical Exam          ED Triage Vitals [08/22/19 1055]     ED Encounter Vitals Group           BP  (!) 151/96        Pulse (Heart Rate)  (!) 103        Resp Rate  20         Temp  (!) 100.9 ??F (38.3 ??C)        Temp src          O2 Sat (%)  (!) 70 %        Weight  181 lb           Height  '5\' 8"'         Physical Exam   Constitutional :  General: She is not in acute distress.     Appearance: She is obese. She is  ill-appearing. She is not toxic-appearing.    HENT:       Head: Normocephalic and atraumatic.      Mouth/Throat:      Pharynx: Oropharynx is clear.    Eyes:       General: No scleral icterus.     Extraocular Movements: Extraocular movements intact.      Conjunctiva/sclera: Conjunctivae normal.   Neck:       Musculoskeletal: Normal range of motion and neck supple.   Cardiovascular :       Rate and Rhythm: Normal rate and regular rhythm.      Pulses: Normal pulses.    Pulmonary:       Effort: Pulmonary effort is normal.      Breath sounds: Normal breath sounds.   Abdominal :      General: Abdomen is flat.      Palpations: Abdomen is soft.      Tenderness: There is no abdominal tenderness. There is no guarding or rebound.     Musculoskeletal: Normal range of motion.      Right lower leg: Edema present.       Left lower leg: Edema present.      Comments: 1 + pitting, no calf tenderness,  no erythema  Left hip is NTTP, ROM is normal.  No rash over the left leg, no cellulitis, no yeast dermatitis    Skin:      General: Skin is warm and dry.      Capillary Refill: Capillary refill takes less than 2 seconds.      Findings: No rash.    Neurological:       General: No focal deficit present.      Mental Status: She is alert and oriented to person, place, and time.    Psychiatric:         Mood and Affect: Mood normal.            Impression and Management Plan        Patient presenting to the emergency department with fever, chills, aches, urinary frequency.  Consider UTI, other infectious etiology, COVID-19.  She has lower extremity edema, consider CHF or DVT, renal insufficiency.  Will check labs to look at white  count, send lactic and procalcitonin to ensure not septic.  Will  start gentle hydration, obtain chest x-ray.  Patient was significantly hypoxic at triage but was not on her home oxygen.  For me on 3 L she was stable above 95%.  This is not an increased  oxygen requirement from home.  Hip pain is chronic.  I don't see cellulitis or zoster changes, no yeast, ROM WNL, don't suspect a septic joint.  Review of records reveals that patient complains of hip pain chronically R & L.        Diagnostic Studies     Lab:      Recent Results (from the past 12 hour(s))     URINALYSIS W/ RFLX MICROSCOPIC          Collection Time: 08/22/19 11:41 AM         Result  Value  Ref Range            Color  DARK YELLOW (A)  YELLOW,STRAW         Appearance  HAZY (A)  CLEAR         Glucose  250 (A)  NEGATIVE,Negative mg/dl       Bilirubin  NEGATIVE  NEGATIVE,Negative         Ketone  NEGATIVE  NEGATIVE,Negative mg/dl       Specific gravity  1.025  1.005 - 1.030         Blood  MODERATE (A)  NEGATIVE,Negative         pH (UA)  6.0  5.0 - 9.0         Protein  100 (A)  NEGATIVE,Negative mg/dl       Urobilinogen  0.2  0.0 - 1.0 mg/dl       Nitrites  NEGATIVE  NEGATIVE,Negative         Leukocyte Esterase  NEGATIVE  NEGATIVE,Negative         SARS-COV-2_FLU_RSV          Collection Time: 08/22/19 11:41 AM       Specimen: NASOPHARYNGEAL SWAB         Result  Value  Ref Range            COVID-19  NEGATIVE  NEGATIVE         Influenza A PCR  NEGATIVE  NEGATIVE         Influenza B PCR  NEGATIVE  NEGATIVE         RSV by PCR  NEGATIVE  NEGATIVE         POC URINE MICROSCOPIC          Collection Time: 08/22/19 11:41 AM         Result  Value  Ref Range            WBC  5-9  /HPF       RBC  1-4  /HPF       CBC WITH AUTOMATED DIFF          Collection Time: 08/22/19  1:04 PM         Result  Value  Ref Range            WBC  15.8 (H)  4.0 - 11.0 1000/mm3       RBC  4.33  3.60 - 5.20 M/uL       HGB  12.5 (L)  13.0 - 17.2 gm/dl       HCT  39.1  37.0 - 50.0 %       MCV  90.3  80.0 - 98.0 fL       MCH  28.9  25.4 - 34.6 pg       MCHC   32.0  30.0 - 36.0 gm/dl       PLATELET  225  140 - 450 1000/mm3       MPV  11.3 (H)  6.0 - 10.0 fL       RDW-SD  41.4  36.4 - 46.3         NRBC  0  0 - 0         IMMATURE GRANULOCYTES  0.7  0.0 - 3.0 %       NEUTROPHILS  77.5 (H)  34 - 64 %       LYMPHOCYTES  14.5 (L)  28 - 48 %       MONOCYTES  6.6  1 - 13 %       EOSINOPHILS  0.3  0 - 5 %       BASOPHILS  0.4  0 - 3 %       METABOLIC PANEL, COMPREHENSIVE  Collection Time: 08/22/19  1:04 PM         Result  Value  Ref Range            Sodium  138  136 - 145 mEq/L       Potassium  3.2 (L)  3.5 - 5.1 mEq/L       Chloride  96 (L)  98 - 107 mEq/L       CO2  39 (H)  21 - 32 mEq/L       Glucose  254 (H)  74 - 106 mg/dl       BUN  8  7 - 25 mg/dl       Creatinine  1.0  0.6 - 1.3 mg/dl       GFR est AA  >60.0          GFR est non-AA  59          Calcium  8.6  8.5 - 10.1 mg/dl       AST (SGOT)  12 (L)  15 - 37 U/L       ALT (SGPT)  16  12 - 78 U/L       Alk. phosphatase  89  45 - 117 U/L       Bilirubin, total  0.6  0.2 - 1.0 mg/dl       Protein, total  8.1  6.4 - 8.2 gm/dl       Albumin  3.7  3.4 - 5.0 gm/dl       Anion gap  4 (L)  5 - 15 mmol/L       LACTIC ACID          Collection Time: 08/22/19  1:04 PM         Result  Value  Ref Range            Lactic Acid  0.7  0.4 - 2.0 mmol/L       PROCALCITONIN          Collection Time: 08/22/19  1:04 PM         Result  Value  Ref Range            PROCALCITONIN  0.05  0.00 - 0.50 ng/ml       TROPONIN I          Collection Time: 08/22/19  1:04 PM         Result  Value  Ref Range            Troponin-I  <0.015  0.000 - 0.045 ng/ml       NT-PRO BNP          Collection Time: 08/22/19  1:04 PM         Result  Value  Ref Range            NT pro-BNP  644.0 (H)  0.0 - 125.0 pg/ml          Labs Reviewed       CBC WITH AUTOMATED DIFF - Abnormal; Notable for the following components:            Result  Value            WBC  15.8 (*)         HGB  12.5 (*)         MPV  11.3 (*)         NEUTROPHILS  77.5 (*)  LYMPHOCYTES  14.5 (*)             All other components within normal limits       METABOLIC PANEL, COMPREHENSIVE - Abnormal; Notable for the following components:            Potassium  3.2 (*)         Chloride  96 (*)         CO2  39 (*)         Glucose  254 (*)         AST (SGOT)  12 (*)         Anion gap  4 (*)            All other components within normal limits       URINALYSIS W/ RFLX MICROSCOPIC - Abnormal; Notable for the following components:            Color  DARK YELLOW (*)         Appearance  HAZY (*)         Glucose  250 (*)         Blood  MODERATE (*)         Protein  100 (*)            All other components within normal limits       NT-PRO BNP - Abnormal; Notable for the following components:            NT pro-BNP  644.0 (*)            All other components within normal limits       CULTURE, BLOOD     CULTURE, URINE     SARS-COV-2_FLU_RSV     CULTURE, BLOOD     CULTURE, RESPIRATORY/SPUTUM/BRONCH W GRAM STAIN     LACTIC ACID     PROCALCITONIN     TROPONIN I     LACTIC ACID       POC URINE MICROSCOPIC           EKG interpretation by [facility], Orlando Penner, MD in ED course.       Imaging:     Xr Chest Sngl V      Result Date: 08/22/2019   INDICATION: sepsis EXAMINATION: XR CHEST SNGL V COMPARISON: 07/20/2019 FINDINGS: The study shows a normal sized heart.. Unchanged oval opacity at the left mid lung zone measuring 7 cm x 3.9 cm.. Pleural density at the left lung base. Prominence of the right  hilum.       IMPRESSION: 1. Unchanged oval opacity at the left mid lung zone measuring 7 x 3.9 cm. 2. Pleural thickening at the left lung base. 3. Prominence of the right hilum.       PVL report:    ??   No evidence of deep vein thrombosis in the bilateral lower extremities as described.   ??   Final Report to follow.   ??   Einar Gip, RVT   Vascular Lab        ED Course        Patient was maintained on cardiac, oxygen, and blood pressure monitoring without arrhythmia or event.  BP elevated, no tachycardia or hypoxia.  Temperature improved.       Patient Vitals for the past 12 hrs:            Temp  Pulse  Resp  BP  SpO2  08/22/19 1416  --  94  15  (!) 171/109  96 %            08/22/19 1358  99.1 ??F (37.3 ??C)  --  --  --  --     08/22/19 1358  --  93  13  --  96 %     08/22/19 1316  --  95  18  (!) 170/109  96 %     08/22/19 1237  --  97  13  (!) 165/101  98 %     08/22/19 1121  --  97  17  (!) 148/66  96 %     08/22/19 1118  --  96  17  (!) 148/66  97 %     08/22/19 1118  --  95  22  --  94 %            08/22/19 1055  (!) 100.9 ??F (38.3 ??C)  (!) 103  20  (!) 151/96  (!) 70 %             ED Course as of Aug 22 1450       Thu Aug 22, 2019        1124  While in room patient on [facility]-- oxygen satruations 100% .  At home on 3L NC-- chagned to 3L, advised RN to let me know if patient requiring more oxygen.     [EI]        1133  Reviewed records:  echo 05/2019-- normal EF      Interpretation Summary   A complete two-dimensional transthoracic echocardiogram was performed (2D, M-   mode, Doppler and color flow Doppler).   The study was technically adequate.   The left ventricle is normal in size with proximal septal thickening noted.   Left ventricular systolic function is normal. Ejection fraction is 63%.   Grade I diastolic dysfunction.   Right ventricle is normal size with preserved systolic function.   No hemodynamically significant valvular disease noted   Insufficient Tricuspid regurgitation to evaluate for PASP/ RVSP.   ??   No previous echocardiogram available for comparison.     [EI]        0086  Chest x-ray was reviewed- Interpretation by myself and Dr. Hermelinda Medicus = chronic lung disease changes, no acute change from 05/2019     [EI]     1207  Nitrites: NEGATIVE [EI]     1207  Leukocyte Esterase: NEGATIVE  [EI]     1207  Blood(!): MODERATE [EI]     1310  COVID-19: NEGATIVE [EI]     1310  WBC: 5-9 [EI]     7619  Patient re-evaluated.  Fluids running.  BP and HR stable on monitor.  Oxygen level stable.  Updated on test results so far.  Labs just sent by  PICC.     [EI]     1334  WBC(!): 15.8 [EI]     1335  HGB(!): 12.5 [EI]     1335  HCT: 39.1 [EI]     1352  EKG reviewed with Dr. Hermelinda Medicus, interpretation by ED attending = NSR rate 96 , no acute ST-T wave changes to suggest ischemia.  QTc 437.           [EI]     1358  Lactic Acid: 0.7 [EI]     1358  Troponin-I: <0.015 [EI]     1358  Creatinine: 1.0 [EI]     1358  BUN: 8 [EI]     1358  Glucose(!): 254 [EI]     1358  Will give oral K+    Potassium(!): 3.2 [EI]     1359  Recent normal echo    NT pro-BNP(!): 644.0 [EI]     1359  Temp: 99.1 ??F (37.3 ??C)  [EI]     1405  Reviewed results with Dr. Hermelinda Medicus- discussed pending urine and blood cultures- patient with normal lactic acid.  Treat with Keflex as 5-9 WBC, urinary  frequency.  Return precautions given.     [EI]     1416  PROCALCITONIN: 0.05 [EI]     1417  Reviewed urine cultures back to 04/2019- they always show skin and genital contamination.  Sent today from a cath urine.     [EI]              ED Course User Index   [EI] Jenelle Mages, PA-C             Medications       oxyCODONE-acetaminophen (PERCOCET) 5-325 mg per tablet 1 Tab (1 Tab Oral Given 08/22/19 1225)     acetaminophen (TYLENOL) tablet 650 mg (650 mg Oral Given 08/22/19 1225)     sodium chloride 0.9 % bolus infusion 500 mL (500 mL IntraVENous New Bag 08/22/19 1315)     potassium bicarbonate (KLYTE) tablet 25 mEq (25 mEq Oral Given 08/22/19 1400)     ketorolac (TORADOL) injection 15 mg (15 mg IntraVENous Given 08/22/19 1440)       cephALEXin (KEFLEX) capsule 500 mg (500 mg Oral Given 08/22/19 1439)             Medical Decision Making        Patient presenting to the ER with fever, chills, myalgia, nausea.  She has had urinary frequency.  While her urine does not show leukocytes or nitrites she does have 5-9 white cells which is greater than at these.  Her chest x-ray does not show pneumonia.   Her white count is slightly elevated at 15.  She has some mild hypokalemia.  She does not have any x-ray findings  of pneumonia.  She has not been complaining of cough or dyspnea.  Testing for COVID-19 and flu are negative.  Unclear etiology of her symptoms  at this time but she has been hemodynamically stable while here.  We do not suspect septic joint as she has chronic hip pain, moves the hip without difficulty.  PVLs are negative for DVT.  We are going to treat with urinary frequency symptoms and white  cells in urine as potential UTI.  Urine and blood cultures are pending.  Patient is safe for discharge at this time but she needs to be vigilant about her symptoms and return if she has higher fever, vomiting, dizzy when standing, increased work of breathing.   She is not requiring more oxygen than baseline.        Final Diagnosis                 ICD-10-CM  ICD-9-CM          1.  Fever and chills   R50.9  780.60     2.  Myalgia   M79.10  729.1     3.  Nausea without vomiting   R11.0  787.02     4.  Hypokalemia   E87.6  276.8     5.  Acute cystitis without hematuria   N30.00  595.0     6.  Chronic left hip pain   M25.552  719.45           G89.29  338.29          Disposition     Patient is discharged home in stable condition, with instructions to follow up with their regular doctor. They are advised to return immediately for any worsening or symptoms of concern.        Follow-up Information               Follow up With  Specialties  Details  Why  Contact Info              Wallene Huh, PA-C  Physician Assistant  Schedule an appointment as soon as possible for a visit   For follow-up of your symptoms  [place] [id number]   [phone]                 [place] DEPT  Emergency Medicine  Go to   If symptoms worsen- vomiting, increased difficulty breathing, dizziness when standing  [place]   [phone]                Rx keflex      The patient was personally evaluated by myself and [facility], Orlando Penner, MD who agrees with the above assessment  and plan.      Naleigha Raimondi E. Gustavus Messing   August 22, 2019      My signature above authenticates this document and my orders, the final     diagnosis (es), discharge prescription (s), and instructions in the Epic     record.   If you have any questions please contact [phone].       Nursing notes have been reviewed by the physician/ advanced practice     Clinician.      Dragon medical dictation software was used for portions of this report. Unintended voice recognition errors may occur.

## 2019-08-23 LAB — POC URINE MICROSCOPIC

## 2019-08-24 LAB — CULTURE, URINE: Isolate: >100000 — AB

## 2019-08-28 LAB — CULTURE, BLOOD 1: BLOOD CULTURE RESULT: NO GROWTH

## 2019-08-28 LAB — CULTURE, BLOOD: Blood Culture Result: NO GROWTH

## 2019-09-05 ENCOUNTER — Ambulatory Visit: Payer: MEDICARE | Attending: Orthopaedic Surgery | Primary: Physician Assistant

## 2019-09-12 ENCOUNTER — Ambulatory Visit: Attending: Orthopaedic Surgery | Primary: Physician Assistant

## 2019-09-12 ENCOUNTER — Ambulatory Visit
Admit: 2019-09-12 | Discharge: 2019-09-12 | Payer: MEDICARE | Attending: Orthopaedic Surgery | Primary: Physician Assistant

## 2019-09-12 DIAGNOSIS — M25551 Pain in right hip: Secondary | ICD-10-CM
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
## 2019-09-13 NOTE — Telephone Encounter (Signed)
Patients MRI order, demographics and office note were faxed to [facility] requesting appt   Tel# [phone] fax# [phone]

## 2019-09-13 NOTE — Telephone Encounter (Signed)
Patient's husband Reita Cliche called stating the patient wants her lumbar spine MRI without contrast to be done at [facility] since they live only 5 minutes away from that location. Please advise.

## 2019-09-19 ENCOUNTER — Encounter

## 2019-09-19 ENCOUNTER — Ambulatory Visit: Payer: MEDICARE | Primary: Physician Assistant

## 2019-09-25 ENCOUNTER — Inpatient Hospital Stay: Admit: 2019-09-25 | Payer: MEDICARE | Primary: Physician Assistant

## 2019-09-25 DIAGNOSIS — M25551 Pain in right hip: Secondary | ICD-10-CM

## 2019-09-26 LAB — CBC WITH AUTO DIFFERENTIAL
Basophils %: 0.4 % (ref 0–3)
Eosinophils %: 1.5 % (ref 0–5)
Hematocrit: 40 % (ref 37.0–50.0)
Hemoglobin: 12.2 gm/dl — ABNORMAL LOW (ref 13.0–17.2)
Immature Granulocytes: 0.4 % (ref 0.0–3.0)
Lymphocytes %: 16.3 % — ABNORMAL LOW (ref 28–48)
MCH: 27.4 pg (ref 25.4–34.6)
MCHC: 30.5 gm/dl (ref 30.0–36.0)
MCV: 89.9 fL (ref 80.0–98.0)
MPV: 12.4 fL — ABNORMAL HIGH (ref 6.0–10.0)
Monocytes %: 6.4 % (ref 1–13)
Neutrophils %: 75 % — ABNORMAL HIGH (ref 34–64)
Nucleated RBCs: 0 (ref 0–0)
Platelets: 216 10*3/uL (ref 140–450)
RBC: 4.45 M/uL (ref 3.60–5.20)
RDW-SD: 40.6 (ref 36.4–46.3)
WBC: 11.4 10*3/uL — ABNORMAL HIGH (ref 4.0–11.0)

## 2019-09-26 LAB — URIC ACID
Uric Acid, Serum: 8.3 mg/dl — ABNORMAL HIGH (ref 2.6–6.0)
Uric acid: 8.3 mg/dl — ABNORMAL HIGH (ref 2.6–6.0)

## 2019-09-26 LAB — SEDIMENTATION RATE: Sed Rate: 51 mm/Hr — ABNORMAL HIGH (ref 0–30)

## 2019-09-26 LAB — C-REACTIVE PROTEIN: CRP: 6.4 mg/L — ABNORMAL HIGH (ref 0.0–2.9)

## 2019-09-26 LAB — CBC WITH AUTOMATED DIFF
BASOPHILS: 0.4 % (ref 0–3)
EOSINOPHILS: 1.5 % (ref 0–5)
HCT: 40 % (ref 37.0–50.0)
HGB: 12.2 gm/dl — ABNORMAL LOW (ref 13.0–17.2)
IMMATURE GRANULOCYTES: 0.4 % (ref 0.0–3.0)
LYMPHOCYTES: 16.3 % — ABNORMAL LOW (ref 28–48)
MCH: 27.4 pg (ref 25.4–34.6)
MCHC: 30.5 gm/dl (ref 30.0–36.0)
MCV: 89.9 fL (ref 80.0–98.0)
MONOCYTES: 6.4 % (ref 1–13)
MPV: 12.4 fL — ABNORMAL HIGH (ref 6.0–10.0)
NEUTROPHILS: 75 % — ABNORMAL HIGH (ref 34–64)
NRBC: 0 (ref 0–0)
PLATELET: 216 10*3/uL (ref 140–450)
RBC: 4.45 M/uL (ref 3.60–5.20)
RDW-SD: 40.6 (ref 36.4–46.3)
WBC: 11.4 10*3/uL — ABNORMAL HIGH (ref 4.0–11.0)

## 2019-09-26 LAB — C REACTIVE PROTEIN, QT: C-Reactive protein: 6.4 mg/L — ABNORMAL HIGH (ref 0.0–2.9)

## 2019-09-26 LAB — SED RATE (ESR): Sed rate (ESR): 51 mm/Hr — ABNORMAL HIGH (ref 0–30)

## 2019-09-27 NOTE — Telephone Encounter (Signed)
Labs are in the chart and are still currently in process. Patient has a f/u on 10/10/19 and results will be discussed at that time.

## 2019-09-27 NOTE — Telephone Encounter (Signed)
Danielle from [facility] called to see if provider received lab results she CC'd.     Duwayne Heck can be reached at [phone] if needed.

## 2019-09-27 NOTE — Telephone Encounter (Signed)
Please check to see if results have been received.  Thanks.

## 2019-09-30 LAB — INTERLEUKIN-6, SERUM: Interleukin-6, serum: 3.9 pg/mL (ref <5.00)

## 2019-10-03 ENCOUNTER — Encounter: Attending: Urology | Primary: Physician Assistant

## 2019-10-08 ENCOUNTER — Inpatient Hospital Stay: Admit: 2019-10-08 | Payer: MEDICARE | Attending: Physician Assistant | Primary: Physician Assistant

## 2019-10-08 DIAGNOSIS — Z1231 Encounter for screening mammogram for malignant neoplasm of breast: Secondary | ICD-10-CM

## 2019-10-10 ENCOUNTER — Ambulatory Visit: Attending: Orthopaedic Surgery | Primary: Physician Assistant

## 2019-10-10 ENCOUNTER — Ambulatory Visit
Admit: 2019-10-10 | Discharge: 2019-10-10 | Payer: MEDICARE | Attending: Orthopaedic Surgery | Primary: Physician Assistant

## 2019-10-10 DIAGNOSIS — M1612 Unilateral primary osteoarthritis, left hip: Secondary | ICD-10-CM

## 2019-10-10 MED ORDER — ALLOPURINOL 100 MG TAB
100 mg | ORAL_TABLET | Freq: Two times a day (BID) | ORAL | 0 refills | Status: DC
Start: 2019-10-10 — End: 2019-11-21
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
## 2019-10-17 ENCOUNTER — Encounter

## 2019-10-17 NOTE — Telephone Encounter (Signed)
Patient's husband called to report they got a call from central scheduling for the MRI and injection as ordered on 10/10/19.  He is requesting to return to [facility] as they have gone there in the past.  Please send to that facility.     Also, for the next appt for follow up he is asking if that visit could be virtual, as it is an ordeal to get patient all the way here with her oxygen and what not.  I did explain that to my knowledge provider is not doing these at this time, but he requested I ask.  Please advise.     Patient's husband, Daisy Blossom, on HIPAA  [phone]

## 2019-10-18 NOTE — Telephone Encounter (Signed)
Schedule at requested facility.    Would like to have an office visit appointment if possible.

## 2019-10-18 NOTE — Telephone Encounter (Signed)
Patients order, demographics and office notes were faxed to [facility] to get scheduled  Tel# [phone] fax# [phone]

## 2019-10-29 ENCOUNTER — Encounter

## 2019-10-29 NOTE — Telephone Encounter (Signed)
Patients husband is requesting we re-fax MRI order to [facility] imaging - they said they couldn't read the bottom of the order.  Last faxed 10/18/19

## 2019-10-29 NOTE — Telephone Encounter (Signed)
Patients MRI was re faxed to [phone]

## 2019-11-05 ENCOUNTER — Inpatient Hospital Stay: Admit: 2019-11-05 | Payer: MEDICARE | Attending: Orthopaedic Surgery | Primary: Physician Assistant

## 2019-11-05 DIAGNOSIS — M1612 Unilateral primary osteoarthritis, left hip: Secondary | ICD-10-CM

## 2019-11-05 MED ORDER — METHYLPREDNISOLONE 40 MG/ML SUSP FOR INJECTION
40 mg/mL | Freq: Once | INTRAMUSCULAR | Status: AC
Start: 2019-11-05 — End: 2019-11-05
  Administered 2019-11-05: 20:00:00 via INTRAMUSCULAR

## 2019-11-05 MED ORDER — IOTHALAMATE MEGLUMINE 60 % INJECTION
60 % | Freq: Once | INTRAMUSCULAR | Status: AC
Start: 2019-11-05 — End: 2019-11-05
  Administered 2019-11-05: 20:00:00 via INTRA_ARTICULAR

## 2019-11-05 MED ORDER — LIDOCAINE (PF) 10 MG/ML (1 %) IJ SOLN
10 mg/mL (1 %) | Freq: Once | INTRAMUSCULAR | Status: AC
Start: 2019-11-05 — End: 2019-11-05
  Administered 2019-11-05: 20:00:00 via SUBCUTANEOUS

## 2019-11-05 MED ORDER — LIDOCAINE HCL 1 % (10 MG/ML) IJ SOLN
10 mg/mL (1 %) | Freq: Once | INTRAMUSCULAR | Status: AC
Start: 2019-11-05 — End: 2019-11-05
  Administered 2019-11-05: 20:00:00 via INTRADERMAL

## 2019-11-05 MED FILL — CONRAY 60 % INJECTION SOLUTION: 60 % | INTRAMUSCULAR | Qty: 50

## 2019-11-07 ENCOUNTER — Ambulatory Visit: Attending: Urology | Primary: Physician Assistant

## 2019-11-07 ENCOUNTER — Ambulatory Visit: Admit: 2019-11-07 | Discharge: 2019-11-07 | Attending: Urology | Primary: Physician Assistant

## 2019-11-07 DIAGNOSIS — N390 Urinary tract infection, site not specified: Secondary | ICD-10-CM

## 2019-11-07 NOTE — Progress Notes (Signed)
Patient did not answer 2 x telemed appointment attempts

## 2019-11-07 NOTE — Progress Notes (Deleted)
11/07/2019    [name] [name]  28-Sep-1950     {No Diagnosis Found}   ASSESSMENT:    -?Recurrent UTI   UCx 08/22/19 grew Enterococcus faecalis    -Suprapubic and vulvar skin lesions    -History of hysterectomy w/ BSO    -Kidney stone     PLAN:  Reviewed ER notes         ***  - Increase fluid intake  - Ensure healthy bowel habits with regular BM daily  - Start D Mannose and Cranberry supplements  - RTC in 2 - 3 months, consider vaginal estrogen if not improvement  - recommend dermatology for recurrent skin abscesses   - Ucx today, call w/ results   - RTC if symptoms, recommend a cath culture.         No chief complaint on file.      HISTORY OF PRESENT ILLNESS:  [name] [name] is a 69 y.o. female who presents today for a UTI. Pt was last seen in 01/2019 for recurrent UTI's with no positive cultures on file.     Pt reported to the ER [facility]) on 08/22/19 complaining of leg swelling and frequency, UA with micro showed 5-9 WBC and 1-4 RBC. UCx grew Enterococcus faecalis, blood culture was negative. Cr was 1.         Has history of hysterectomy (says ovaries were removed as well) in 2010 for bleeding. Has not seen GYN since then.    Hx: HTN, COPD, Hep C, DM, thoracic AAA, PTSD.     Review of Systems  Constitutional: Fever:   Skin: Rash:   HEENT: Hearing difficulty:   Eyes: Blurred vision:   Cardiovascular: Chest pain:   Respiratory: Shortness of breath:   Gastrointestinal: Nausea/vomiting:   Musculoskeletal: Back pain:   Neurological: Weakness:   Psychological: Memory loss:   Comments/additional findings:     Past Medical History:   Diagnosis Date   ??? Arthritis    ??? 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 DM2 (diabetes mellitus, type 2) ([place])    ??? GERD (gastroesophageal reflux disease)    ??? Hepatitis C     HEP C   ??? HTN (hypertension)    ??? Lung nodule, multiple     (CT 10/2016) Small left upper lobe and 1.7 x 1.6 cm left  lower lobe nodules not significantly changed from 07/23/2016 and 03/22/2016   ??? Marijuana use    ??? PTSD (post-traumatic stress disorder)     lived through the [place] in 1993   ??? Thoracic ascending aortic aneurysm ([place])     4.4cm noted on CT Chest (10/2016)   ??? Thyroid nodule     2.5 cm stable right thyroid nodule (CT 10/2016)       Past Surgical History:   Procedure Laterality Date   ??? HX ENDOSCOPY     ??? HX HERNIA REPAIR  2019    x2   ??? HX HIP REPLACEMENT Right 01/29/2015   ??? HX HIP REPLACEMENT     ??? HX HYSTERECTOMY  2010    hysterectomy       Social History     Tobacco Use   ??? Smoking status: Former Smoker   ??? Smokeless tobacco: Never Used   ??? Tobacco comment: reported as quit smoking around 1990s per spouse   Substance  Use Topics   ??? Alcohol use: No   ??? Drug use: Not Currently     Types: Marijuana     Comment: states she has not used marijuana in months       Allergies   Allergen Reactions   ??? Chocolate [Cocoa] Sneezing     Confirms allergy but reports "I still eat it"       Family History   Problem Relation Age of Onset   ??? Hypertension Mother    ??? Other Mother         OSA   ??? Cancer Father         suspected leukemia per pt's spouse   ??? Cancer Maternal Aunt    ??? Alcohol abuse Maternal Grandfather        Current Outpatient Medications   Medication Sig Dispense Refill   ??? allopurinoL (ZYLOPRIM) 100 mg tablet Take 1 Tab by mouth two (2) times a day. 60 Tab 0   ??? acetaminophen (TYLENOL) 325 mg tablet Take 2 Tabs by mouth every four (4) hours as needed for Pain. 20 Tab 0   ??? methocarbamoL (Robaxin) 500 mg tablet Take 1 Tab by mouth four (4) times daily. As needed for muscle pain or spasm 20 Tab 0   ??? albuterol (PROVENTIL HFA, VENTOLIN HFA, PROAIR HFA) 90 mcg/actuation inhaler Take 2 Puffs by inhalation every four (4) hours as needed for Wheezing. 1 Inhaler 1   ??? ibuprofen (MOTRIN) 600 mg tablet Take 1 Tab by mouth every six (6) hours as needed for Pain. 20 Tab 0   ??? dilTIAZem CD (CARDIZEM CD) 240 mg  ER capsule Take 1 Cap by mouth daily. 30 Cap 1   ??? losartan (COZAAR) 50 mg tablet Take 1 Tab by mouth daily. 30 Tab 1   ??? cetirizine (ZYRTEC) 10 mg tablet Take 1 Tab by mouth daily. 30 Tab 1   ??? fluticasone propionate (FLONASE) 50 mcg/actuation nasal spray 2 Sprays by Both Nostrils route daily. (Patient taking differently: 2 Sprays by Both Nostrils route daily as needed.) 1 Bottle 1   ??? guaiFENesin ER (MUCINEX) 600 mg ER tablet Take 1 Tab by mouth every twelve (12) hours. (Patient taking differently: Take 600 mg by mouth daily as needed.) 20 Tab 0   ??? LANTUS SOLOSTAR U-100 INSULIN 100 unit/mL (3 mL) inpn INJECT 32 UNITS SUBCUTANEOUSLY  NIGHTLY 15 mL 0   ??? albuterol (ACCUNEB) 1.25 mg/3 mL nebu USE 1 VIAL IN NEBULIZER EVERY 6 HOURS AS NEEDED FOR  SHORTNESS  OF  BREATH,  WHEEZING 270 mL 1   ??? cloNIDine HCl (CATAPRES) 0.1 mg tablet Take 3 Tabs by mouth two (2) times a day. 60 Tab 1   ??? metFORMIN ER (GLUCOPHAGE XR) 500 mg tablet TAKE 1 TABLET TWICE DAILY 180 Tab 1   ??? montelukast (SINGULAIR) 10 mg tablet TAKE 1 TABLET EVERY DAY 90 Tab 3   ??? gabapentin (NEURONTIN) 300 mg capsule Take 1 Cap by mouth three (3) times daily. Max Daily Amount: 900 mg. 270 Cap 1   ??? polyethylene glycol (MIRALAX) 17 gram packet Take 1 Packet by mouth daily. (Patient taking differently: Take 17 g by mouth daily as needed for Constipation.) 30 Packet 1   ??? magnesium oxide (MAG-OX) 400 mg tablet Take 400 mg by mouth daily.     ??? QUEtiapine SR (SEROQUEL XR) 150 mg sr tablet Take 150 mg by mouth nightly as needed.     ??? tiotropium bromide (SPIRIVA RESPIMAT)  2.5 mcg/actuation inhaler Take 1 Puff by inhalation daily. 1 Inhaler 0   ??? fluticasone propion-salmeterol (ADVAIR DISKUS) 100-50 mcg/dose diskus inhaler Take 1 Puff by inhalation two (2) times a day. 1 Inhaler 0   ??? rosuvastatin (CRESTOR) 5 mg tablet TAKE 1 TABLET BY MOUTH NIGHTLY 90 Tab 1         PHYSICAL EXAMINATION:     There were no vitals taken for this visit.  Constitutional: Well developed,  well-nourished female in no acute distress.   CV:  No peripheral swelling noted. RRR radial pulse.  Respiratory: No respiratory distress or difficulties  Abdomen:  Soft and nontender. No masses. No hepatosplenomegaly.   GU Female:  Mild vaginal atrophy.  No leak with cough,  Mild urethral hypermobility. Suprapubic superficial wound with granulation tissue, 1 cm in size, non painful, no erythema, warmth or drainage.    Skin:  Normal color. No evidence of jaundice.     Neuro/Psych:  Patient with appropriate affect.  Alert and oriented.    Lymphatic:   No enlargement of supraclavicular lymph nodes.      REVIEW OF LABS AND IMAGING:      Results for orders placed or performed during the hospital encounter of 09/25/19   INTERLEUKIN-6, SERUM   Result Value Ref Range    Interleukin-6, serum 3.90 <5.00 pg/mL   C REACTIVE PROTEIN, QT   Result Value Ref Range    C-Reactive protein 6.4 (H) 0.0 - 2.9 mg/L   CBC WITH AUTOMATED DIFF   Result Value Ref Range    WBC 11.4 (H) 4.0 - 11.0 1000/mm3    RBC 4.45 3.60 - 5.20 M/uL    HGB 12.2 (L) 13.0 - 17.2 gm/dl    HCT 40.0 37.0 - 50.0 %    MCV 89.9 80.0 - 98.0 fL    MCH 27.4 25.4 - 34.6 pg    MCHC 30.5 30.0 - 36.0 gm/dl    PLATELET 216 140 - 450 1000/mm3    MPV 12.4 (H) 6.0 - 10.0 fL    RDW-SD 40.6 36.4 - 46.3      NRBC 0 0 - 0      IMMATURE GRANULOCYTES 0.4 0.0 - 3.0 %    NEUTROPHILS 75.0 (H) 34 - 64 %    LYMPHOCYTES 16.3 (L) 28 - 48 %    MONOCYTES 6.4 1 - 13 %    EOSINOPHILS 1.5 0 - 5 %    BASOPHILS 0.4 0 - 3 %   URIC ACID   Result Value Ref Range    Uric acid 8.3 (H) 2.6 - 6.0 mg/dl   SED RATE (ESR)   Result Value Ref Range    Sed rate (ESR) 51 (H) 0 - 30 mm/Hr     CT Abd Pelv WO Cont     Urine Culture  11/21/18 - 100,000 CFU/mL Multiple microorganisms present; probable contamination.  11/16/18 - >100,000 CFU/mL Multiple microorganisms present; probable contamination.      05/11/2018 CT a/p with contrast  IMPRESSION:  1. Patient appears to be status post umbilical and supraumbilical  ventral  abdominal wall herniorrhaphy. Prominent subcutaneous inflammatory change  overlying the previous supraumbilical hernia. This may reflect inflammation,  phlegmon or fat necrosis. No obvious abscess.  2. Intra-abdominal inflammation deep to the a forementioned subcutaneous  supraumbilical hernia repair, involving the greater omentum with tiny locules of  fluid abutting the anterior surface of the left hepatic lobe. This likely  reflects postoperative changes. Developing infectious process cannot be  excluded. No drainable fluid collection or abscess.  3. Findings which may reflect a very mild/early diverticulitis of the sigmoid  colon. Clinical correlation is recommended.  4. Nonobstructing right renal calculus.  5. Emphysematous changes.  6. Stable indeterminate splenic hypodensities. These may reflect tiny  pseudocysts or hemangioma. Follow-up CT in 12 months to ensure stability.     A copy of today's office visit with all pertinent imaging results and labs were sent to the referring physician.      Any elements of the PMH, FH, SHx, ROS, or preliminary elements of the HPI that were entered by a medical assistant have been reviewed in full.         Harrington Challenger, MD  [facility], [place].   [place], [place]  [phone] (office)      Medical documentation is provided with the assistance of Waldon Reining, medical scribe for Waldon Reining on 11/07/2019

## 2019-11-07 NOTE — Progress Notes (Signed)
Patient did not answer 2 x telemed appointment attempts

## 2019-11-10 ENCOUNTER — Encounter

## 2019-11-20 NOTE — Telephone Encounter (Signed)
 Last Visit: 10/10/19 with MD Coletta  Next Appointment: 11/22/19 with MD Marlow  Previous Refill Encounter(s): 10/10/19 #60    Requested Prescriptions     Pending Prescriptions Disp Refills   . allopurinoL  (ZYLOPRIM ) 100 mg tablet [Pharmacy Med Name: Allopurinol  100 MG Oral Tablet] 60 Tablet 0     Sig: TAKE 1 TABLET BY MOUTH TWO (2) DAILY

## 2019-11-21 MED ORDER — ALLOPURINOL 100 MG TAB
100 mg | ORAL_TABLET | ORAL | 0 refills | Status: DC
Start: 2019-11-21 — End: 2019-12-27

## 2019-11-21 NOTE — Telephone Encounter (Signed)
Call pt for rx pick up

## 2019-11-22 ENCOUNTER — Ambulatory Visit: Payer: MEDICARE | Attending: Orthopaedic Surgery | Primary: Physician Assistant

## 2019-12-19 ENCOUNTER — Ambulatory Visit: Payer: MEDICARE | Attending: Orthopaedic Surgery | Primary: Physician Assistant

## 2019-12-24 ENCOUNTER — Encounter

## 2019-12-27 MED ORDER — ALLOPURINOL 100 MG TAB
100 mg | ORAL_TABLET | ORAL | 0 refills | Status: DC
Start: 2019-12-27 — End: 2020-02-04

## 2019-12-27 NOTE — Telephone Encounter (Signed)
Call pt for rx pick up

## 2020-01-17 ENCOUNTER — Encounter: Payer: MEDICARE | Attending: Orthopaedic Surgery | Primary: Physician Assistant

## 2020-02-03 ENCOUNTER — Encounter

## 2020-02-04 MED ORDER — ALLOPURINOL 100 MG TAB
100 mg | ORAL_TABLET | ORAL | 0 refills | Status: DC
Start: 2020-02-04 — End: 2020-03-11

## 2020-02-04 NOTE — Telephone Encounter (Signed)
Call pt for rx pick up

## 2020-02-13 ENCOUNTER — Ambulatory Visit: Payer: MEDICARE | Attending: Orthopaedic Surgery | Primary: Physician Assistant

## 2020-03-10 ENCOUNTER — Encounter

## 2020-03-11 MED ORDER — ALLOPURINOL 100 MG TAB
100 mg | ORAL_TABLET | ORAL | 0 refills | Status: DC
Start: 2020-03-11 — End: 2020-04-07

## 2020-03-11 NOTE — Telephone Encounter (Signed)
Call pt for rx pick up

## 2020-03-19 ENCOUNTER — Encounter: Payer: MEDICARE | Attending: Orthopaedic Surgery | Primary: Physician Assistant

## 2020-04-07 ENCOUNTER — Encounter

## 2020-04-07 MED ORDER — ALLOPURINOL 100 MG TAB
100 mg | ORAL_TABLET | ORAL | 0 refills | Status: DC
Start: 2020-04-07 — End: 2020-05-12

## 2020-04-07 NOTE — Telephone Encounter (Signed)
Call pt for rx pick up

## 2020-04-09 ENCOUNTER — Encounter: Payer: MEDICARE | Attending: Orthopaedic Surgery | Primary: Physician Assistant

## 2020-05-01 ENCOUNTER — Encounter: Payer: MEDICARE | Attending: Orthopaedic Surgery | Primary: Physician Assistant

## 2020-05-12 ENCOUNTER — Encounter

## 2020-05-12 MED ORDER — ALLOPURINOL 100 MG TAB
100 mg | ORAL_TABLET | ORAL | 0 refills | Status: DC
Start: 2020-05-12 — End: 2020-06-24

## 2020-05-12 NOTE — Telephone Encounter (Signed)
Call pt for rx pick up

## 2020-05-26 ENCOUNTER — Emergency Department: Admit: 2020-05-26 | Payer: MEDICARE | Primary: Physician Assistant

## 2020-05-26 ENCOUNTER — Inpatient Hospital Stay
Admit: 2020-05-26 | Discharge: 2020-06-02 | Disposition: A | Discharge status: Skilled Nursing Facility | Payer: MEDICARE | Attending: Internal Medicine | Admitting: Internal Medicine

## 2020-05-26 DIAGNOSIS — J441 Chronic obstructive pulmonary disease with (acute) exacerbation: Principal | ICD-10-CM

## 2020-05-26 LAB — CBC WITH AUTO DIFFERENTIAL
Basophils %: 0.2 % (ref 0–3)
Eosinophils %: 1 % (ref 0–5)
Hematocrit: 42.6 % (ref 37.0–50.0)
Hemoglobin: 13.5 gm/dl (ref 13.0–17.2)
Immature Granulocytes: 0.6 % (ref 0.0–3.0)
Lymphocytes %: 10.4 % — ABNORMAL LOW (ref 28–48)
MCH: 27.2 pg (ref 25.4–34.6)
MCHC: 31.7 gm/dl (ref 30.0–36.0)
MCV: 85.7 fL (ref 80.0–98.0)
MPV: 12.5 fL — ABNORMAL HIGH (ref 6.0–10.0)
Monocytes %: 5.6 % (ref 1–13)
Neutrophils %: 82.2 % — ABNORMAL HIGH (ref 34–64)
Nucleated RBCs: 0 (ref 0–0)
Platelets: 238 10*3/uL (ref 140–450)
RBC: 4.97 M/uL (ref 3.60–5.20)
RDW-SD: 45.3 (ref 36.4–46.3)
WBC: 14.7 10*3/uL — ABNORMAL HIGH (ref 4.0–11.0)

## 2020-05-26 LAB — BASIC METABOLIC PANEL
Anion Gap: 1 mmol/L — ABNORMAL LOW (ref 5–15)
BUN: 12 mg/dl (ref 7–25)
CO2: 35 mEq/L — ABNORMAL HIGH (ref 21–32)
Calcium: 9.8 mg/dl (ref 8.5–10.1)
Chloride: 97 mEq/L — ABNORMAL LOW (ref 98–107)
Creatinine: 1 mg/dl (ref 0.6–1.3)
EGFR IF NonAfrican American: 59
GFR African American: >60
Glucose: 374 mg/dl — ABNORMAL HIGH (ref 74–106)
Potassium: 3.8 mEq/L (ref 3.5–5.1)
Sodium: 133 mEq/L — ABNORMAL LOW (ref 136–145)

## 2020-05-26 LAB — EKG 12-LEAD
Atrial Rate: 288 {beats}/min
Atrial Rate: 100 {beats}/min
Diagnosis: NORMAL
P Axis: 76 degrees
P-R Interval: 162 ms
Q-T Interval: 332 ms
Q-T Interval: 352 ms
QRS Duration: 68 ms
QRS Duration: 68 ms
QTc Calculation (Bazett): 434 ms
QTc Calculation (Bazett): 454 ms
R Axis: 32 degrees
R Axis: 30 degrees
T Axis: 52 degrees
T Axis: 60 degrees
Ventricular Rate: 103 {beats}/min
Ventricular Rate: 100 {beats}/min

## 2020-05-26 LAB — POC BLOOD GAS + LACTIC ACID
BASE EXCESS: 9 mmol/L — ABNORMAL HIGH (ref <=3)
BICARBONATE: 34.5 mmol/L — CR (ref 18.0–26.0)
Base Excess: 9 mmol/L — ABNORMAL HIGH (ref <=3)
CO2 Total: 36 mmol/L — ABNORMAL HIGH (ref 24–29)
CO2, TOTAL: 36 mmol/L — ABNORMAL HIGH (ref 24–29)
EPAP/CPAP/PEEP: 5
EPAP/CPAP/PEEP: 5
FIO2: 40
FIO2: 40
HCO3: 34.5 mmol/L (ref 18.0–26.0)
IPAP/PIP: 15
IPAP/PIP: 15
LACTIC ACID: 0.59 mmol/L (ref 0.40–2.00)
Lactic Acid: 0.59 mmol/L (ref 0.40–2.00)
O2 SAT: 98 % (ref 90–100)
O2 Sat: 98 % (ref 90–100)
PCO2: 65.6 mm Hg (ref 35.0–45.0)
PCO2: 65.6 mm Hg — CR (ref 35.0–45.0)
PO2: 114 mm Hg — ABNORMAL HIGH (ref 75–100)
PO2: 114 mm Hg — ABNORMAL HIGH (ref 75–100)
Patient temp.: 37.2
Patient temperature: 37.2
RESPIRATORY RATE: 18
Respiratory Rate: 18
SET RATE: 10
Set Rate: 10
VTEXP, VTEXP: 429
VTEXP: 429
pH: 7.33 — ABNORMAL LOW (ref 7.350–7.450)
pH: 7.33 — ABNORMAL LOW (ref 7.350–7.450)

## 2020-05-26 LAB — PROBNP, N-TERMINAL: BNP: 9 pg/ml (ref 0.0–125.0)

## 2020-05-26 LAB — POCT GLUCOSE: POC Glucose: 370 mg/dL — ABNORMAL HIGH (ref 65–105)

## 2020-05-26 LAB — TROPONIN, HIGH SENSITIVITY: Troponin, High Sensitivity: 5 ng/L (ref 0–59)

## 2020-05-26 LAB — EKG, 12 LEAD, INITIAL
Atrial Rate: 288 {beats}/min
Atrial Rate: 100 {beats}/min
Calculated P Axis: 76 degrees
Calculated R Axis: 32 degrees
Calculated R Axis: 30 degrees
Calculated T Axis: 52 degrees
Calculated T Axis: 60 degrees
Diagnosis: NORMAL
P-R Interval: 162 ms
Q-T Interval: 332 ms
Q-T Interval: 352 ms
QRS Duration: 68 ms
QRS Duration: 68 ms
QTC Calculation (Bezet): 434 ms
QTC Calculation (Bezet): 454 ms
Ventricular Rate: 103 {beats}/min
Ventricular Rate: 100 {beats}/min

## 2020-05-26 LAB — CBC WITH AUTOMATED DIFF
BASOPHILS: 0.2 % (ref 0–3)
EOSINOPHILS: 1 % (ref 0–5)
HCT: 42.6 % (ref 37.0–50.0)
HGB: 13.5 gm/dl (ref 13.0–17.2)
IMMATURE GRANULOCYTES: 0.6 % (ref 0.0–3.0)
LYMPHOCYTES: 10.4 % — ABNORMAL LOW (ref 28–48)
MCH: 27.2 pg (ref 25.4–34.6)
MCHC: 31.7 gm/dl (ref 30.0–36.0)
MCV: 85.7 fL (ref 80.0–98.0)
MONOCYTES: 5.6 % (ref 1–13)
MPV: 12.5 fL — ABNORMAL HIGH (ref 6.0–10.0)
NEUTROPHILS: 82.2 % — ABNORMAL HIGH (ref 34–64)
NRBC: 0 (ref 0–0)
PLATELET: 238 10*3/uL (ref 140–450)
RBC: 4.97 M/uL (ref 3.60–5.20)
RDW-SD: 45.3 (ref 36.4–46.3)
WBC: 14.7 10*3/uL — ABNORMAL HIGH (ref 4.0–11.0)

## 2020-05-26 LAB — METABOLIC PANEL, BASIC
Anion gap: 1 mmol/L — ABNORMAL LOW (ref 5–15)
BUN: 12 mg/dl (ref 7–25)
CO2: 35 mEq/L — ABNORMAL HIGH (ref 21–32)
Calcium: 9.8 mg/dl (ref 8.5–10.1)
Chloride: 97 mEq/L — ABNORMAL LOW (ref 98–107)
Creatinine: 1 mg/dl (ref 0.6–1.3)
GFR est AA: >60
GFR est non-AA: 59
Glucose: 374 mg/dl — ABNORMAL HIGH (ref 74–106)
Potassium: 3.8 mEq/L (ref 3.5–5.1)
Sodium: 133 mEq/L — ABNORMAL LOW (ref 136–145)

## 2020-05-26 LAB — GLUCOSE, POC: Glucose (POC): 370 mg/dL — ABNORMAL HIGH (ref 65–105)

## 2020-05-26 LAB — NT-PRO BNP: NT pro-BNP: 9 pg/ml (ref 0.0–125.0)

## 2020-05-26 LAB — TROPONIN-HIGH SENSITIVITY: Troponin-High Sensitivity: 5 ng/L (ref 0–59)

## 2020-05-26 MED ORDER — TIOTROPIUM BROMIDE 2.5 MCG/ACTUATION MIST FOR INHALATION
2.5 mcg/actuation | Freq: Every day | RESPIRATORY_TRACT | Status: DC
Start: 2020-05-26 — End: 2020-06-02
  Administered 2020-05-28 – 2020-06-02 (×6): via RESPIRATORY_TRACT

## 2020-05-26 MED ORDER — IPRATROPIUM-ALBUTEROL 2.5 MG-0.5 MG/3 ML NEB SOLUTION
2.5 mg-0.5 mg/3 ml | Freq: Three times a day (TID) | RESPIRATORY_TRACT | Status: DC
Start: 2020-05-26 — End: 2020-05-26
  Administered 2020-05-26: 20:00:00 via RESPIRATORY_TRACT

## 2020-05-26 MED ORDER — ELECTROLYTE REPLACEMENT PROTOCOL
Status: DC | PRN
Start: 2020-05-26 — End: 2020-05-28

## 2020-05-26 MED ORDER — INSULIN GLARGINE 100 UNIT/ML INJECTION
100 unit/mL | Freq: Every evening | SUBCUTANEOUS | Status: DC
Start: 2020-05-26 — End: 2020-05-30
  Administered 2020-05-27 – 2020-05-30 (×4): via SUBCUTANEOUS

## 2020-05-26 MED ORDER — FUROSEMIDE 10 MG/ML IJ SOLN
10 mg/mL | Freq: Two times a day (BID) | INTRAMUSCULAR | Status: DC
Start: 2020-05-26 — End: 2020-05-28
  Administered 2020-05-26 – 2020-05-28 (×4): via INTRAVENOUS

## 2020-05-26 MED ORDER — ACETAMINOPHEN 650 MG RECTAL SUPPOSITORY
650 mg | RECTAL | Status: DC | PRN
Start: 2020-05-26 — End: 2020-06-02

## 2020-05-26 MED ORDER — ONDANSETRON (PF) 4 MG/2 ML INJECTION
4 mg/2 mL | INTRAMUSCULAR | Status: DC | PRN
Start: 2020-05-26 — End: 2020-06-02
  Administered 2020-05-28: 13:00:00 via INTRAVENOUS

## 2020-05-26 MED ORDER — ALBUTEROL SULFATE 1.25 MG/3 ML (0.042 %) SOLN FOR INHALATION
1.25 mg/3 mL | RESPIRATORY_TRACT | Status: DC | PRN
Start: 2020-05-26 — End: 2020-05-30
  Administered 2020-05-29: 07:00:00 via RESPIRATORY_TRACT

## 2020-05-26 MED ORDER — DILTIAZEM ER 240 MG 24 HR CAP
240 mg | Freq: Every day | ORAL | Status: DC
Start: 2020-05-26 — End: 2020-06-02
  Administered 2020-05-26 – 2020-06-02 (×8): via ORAL

## 2020-05-26 MED ORDER — AMLODIPINE 5 MG TAB
5 mg | Freq: Every day | ORAL | Status: DC
Start: 2020-05-26 — End: 2020-06-02
  Administered 2020-05-27 – 2020-06-02 (×7): via ORAL

## 2020-05-26 MED ORDER — MONTELUKAST 10 MG TAB
10 mg | Freq: Every day | ORAL | Status: DC
Start: 2020-05-26 — End: 2020-06-02
  Administered 2020-05-27 – 2020-06-02 (×7): via ORAL

## 2020-05-26 MED ORDER — NALOXONE 0.4 MG/ML INJECTION
0.4 mg/mL | INTRAMUSCULAR | Status: DC | PRN
Start: 2020-05-26 — End: 2020-06-02

## 2020-05-26 MED ORDER — METHYLPREDNISOLONE (PF) 125 MG/2 ML IJ SOLR
125 mg/2 mL | Freq: Once | INTRAMUSCULAR | Status: AC
Start: 2020-05-26 — End: 2020-05-26
  Administered 2020-05-26: 20:00:00 via INTRAVENOUS

## 2020-05-26 MED ORDER — LOSARTAN 50 MG TAB
50 mg | Freq: Every day | ORAL | Status: DC
Start: 2020-05-26 — End: 2020-06-02
  Administered 2020-05-27 – 2020-06-02 (×7): via ORAL

## 2020-05-26 MED ORDER — METHYLPREDNISOLONE (PF) 125 MG/2 ML IJ SOLR
125 mg/2 mL | Freq: Three times a day (TID) | INTRAMUSCULAR | Status: DC
Start: 2020-05-26 — End: 2020-05-27
  Administered 2020-05-27 (×2): via INTRAVENOUS

## 2020-05-26 MED ORDER — HEPARIN (PORCINE) 5,000 UNIT/ML IJ SOLN
5000 unit/mL | Freq: Three times a day (TID) | INTRAMUSCULAR | Status: DC
Start: 2020-05-26 — End: 2020-06-02
  Administered 2020-05-27 – 2020-06-02 (×19): via SUBCUTANEOUS

## 2020-05-26 MED ORDER — ACETAMINOPHEN 325 MG TABLET
325 mg | ORAL | Status: DC | PRN
Start: 2020-05-26 — End: 2020-06-02
  Administered 2020-05-26 – 2020-05-27 (×3): via ORAL

## 2020-05-26 MED ORDER — GLUCAGON 1 MG INJECTION
1 mg | INTRAMUSCULAR | Status: DC | PRN
Start: 2020-05-26 — End: 2020-06-02

## 2020-05-26 MED ORDER — INSULIN LISPRO 100 UNIT/ML INJECTION
100 unit/mL | SUBCUTANEOUS | Status: DC | PRN
Start: 2020-05-26 — End: 2020-05-30
  Administered 2020-05-26 – 2020-05-27 (×2): via SUBCUTANEOUS

## 2020-05-26 MED ORDER — ALLOPURINOL 100 MG TAB
100 mg | Freq: Two times a day (BID) | ORAL | Status: DC
Start: 2020-05-26 — End: 2020-06-02
  Administered 2020-05-27 – 2020-06-02 (×14): via ORAL

## 2020-05-26 MED ORDER — ROSUVASTATIN 5 MG TAB
5 mg | Freq: Every evening | ORAL | Status: DC
Start: 2020-05-26 — End: 2020-06-02
  Administered 2020-05-27 – 2020-06-02 (×8): via ORAL

## 2020-05-26 MED ORDER — INSULIN LISPRO 100 UNIT/ML INJECTION
100 unit/mL | Freq: Four times a day (QID) | SUBCUTANEOUS | Status: DC
Start: 2020-05-26 — End: 2020-05-30
  Administered 2020-05-26 – 2020-05-30 (×15): via SUBCUTANEOUS

## 2020-05-26 MED ORDER — GABAPENTIN 300 MG CAP
300 mg | Freq: Three times a day (TID) | ORAL | Status: DC
Start: 2020-05-26 — End: 2020-06-02
  Administered 2020-05-29 – 2020-06-02 (×13): via ORAL

## 2020-05-26 MED ORDER — IPRATROPIUM-ALBUTEROL 2.5 MG-0.5 MG/3 ML NEB SOLUTION
2.5 mg-0.5 mg/3 ml | RESPIRATORY_TRACT | Status: DC
Start: 2020-05-26 — End: 2020-05-27
  Administered 2020-05-27 (×4): via RESPIRATORY_TRACT

## 2020-05-26 MED ORDER — FLUTICASONE 100 MCG-VILANTEROL 25 MCG/DOSE BREATH ACTIVATED INHALER
100-25 mcg/dose | Freq: Every day | RESPIRATORY_TRACT | Status: DC
Start: 2020-05-26 — End: 2020-06-02
  Administered 2020-05-28 – 2020-06-02 (×6): via RESPIRATORY_TRACT

## 2020-05-26 MED ORDER — ACETAMINOPHEN (TYLENOL) SOLUTION 32MG/ML
ORAL | Status: DC | PRN
Start: 2020-05-26 — End: 2020-06-02

## 2020-05-26 MED ORDER — DEXTROSE 50% IN WATER (D50W) IV
INTRAVENOUS | Status: DC | PRN
Start: 2020-05-26 — End: 2020-06-02

## 2020-05-26 MED FILL — ELECTROLYTE REPLACEMENT PROTOCOL: Qty: 1

## 2020-05-26 MED FILL — DILTIAZEM ER 120 MG 24 HR CAP: 120 mg | ORAL | Qty: 2

## 2020-05-26 MED FILL — IPRATROPIUM-ALBUTEROL 2.5 MG-0.5 MG/3 ML NEB SOLUTION: 2.5 mg-0.5 mg/3 ml | RESPIRATORY_TRACT | Qty: 3

## 2020-05-26 MED FILL — SOLU-MEDROL (PF) 125 MG/2 ML SOLUTION FOR INJECTION: 125 mg/2 mL | INTRAMUSCULAR | Qty: 2

## 2020-05-26 MED FILL — TYLENOL 325 MG TABLET: 325 mg | ORAL | Qty: 2

## 2020-05-26 MED FILL — FUROSEMIDE 10 MG/ML IJ SOLN: 10 mg/mL | INTRAMUSCULAR | Qty: 2

## 2020-05-26 NOTE — Progress Notes (Signed)
PAGER ID: [phone]   MESSAGE: Dr. Corinda Gubler [phone], [place](Copd exac) pt wanted her sequel. Respiratory has done her ABG's, she stated the were normal. please call [phone]. Thanks. Archie Patten RN

## 2020-05-26 NOTE — Progress Notes (Signed)
05/26/20 1310   Oxygen Therapy   O2 Sat (%) 100 %   O2 Device BIPAP; Heated   Skin Assessment Clean, dry, & intact   Skin Protection for O2 Device N/A   O2 Temperature 87.8 F (31 C)   FIO2 (%) 40 %   Patient placed on Bipap at this time per verbal order. Patient displayed increased WOB and Resp distress. Settings are as documented, water level is adequate for heat humidification, and alarms are set and functioning appropriately. Will wean as/if tolerated and continue supportive care.

## 2020-05-26 NOTE — Progress Notes (Signed)
PAGER ID: [phone]   MESSAGE: Dr. Corinda Gubler [phone], [place])I inform pt that you did not want her to have her Seroquel tonight secondary to respiratory status, but she is very persistent. She states she will call her husband or even go home. Call [phone]. Thanks. Archie Patten
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
## 2020-05-26 NOTE — ED Notes (Signed)
Pt arrives in respiratory distress  Initial sats in 80's  Pt on CPAP at home  Pt placed on 3L in triage and sats up to 90%  PT is using accessory muscles to breath in triage and is audibly grunting in triage     Pt cannot walk with becoming extremely SOB    Pt also reports left arm and left leg edema for days per husband

## 2020-05-26 NOTE — ED Provider Notes (Signed)
ED Provider Notes by Everardo All, PA at 05/26/20 1338                Author: Everardo All, PA  Service: EMERGENCY  Author Type: Physician Assistant       Filed: 05/26/20 1506  Date of Service: 05/26/20 1338  Status: Attested           Editor: Everardo All, PA (Physician Assistant)  Cosigner: Konrad Felix, MD at 05/26/20 1558          Attestation signed by Konrad Felix, MD at 05/26/20 1558          I, Dr. Haze Justin, have personally seen and examined this patient; I have fully participated in the care of this patient with the advanced practice provider.   I have reviewed and agree with all pertinent clinical information including history, physical exam, labs, radiographic studies and the plan.  I have also reviewed and agree with the medications, allergies and past medical history sections for this patient.   Patient presents with complaints of respiratory distress, diffuse wheezes on exam with labored respirations, patient with significant respiratory distress, placed on BiPAP with gradual improvement in her respiratory distress and work of breathing.      Discussed patient with pulmonary who will see patient in consultation.  Discussed patient with Dr. Corinda Gubler who will admit to stepdown unit.      Critical care time excluding procedures, but including direct patient care, reviewing medical records, evaluating results of diagnostic testing, discussions with family members, and consulting with physicians: 40 minutes       Konrad Felix, MD                                   [facility]   Emergency Department Treatment Report          Patient: [name] [name]  Age: 69 y.o.  Sex: female          Date of Birth: 12-Sep-1950  Admit Date: 05/26/2020  PCP: Marjory Lies         MRN: [id number]   CSN: [id number]            Room: ER22/ER22  Time Dictated: 1:38 PM          Attending Physician: Konrad Felix, MD   Physician Assistant: Asencion Islam      Chief Complaint       Chief Complaint       Patient presents with        ?  Respiratory Distress             History of Present Illness     69 y.o. female  with history of DM, HTN, COPD on 3L NC who presents to the ED in respiratory distress.  Patient becomes extremely short of breath.  It is worse with exertion.  She is also had a nonproductive cough.  Denies fevers or chills.  Denies chest pain      Husband is concerned because patient reports edema of the left upper and lower extremity for the past few days.      Patient has had difficulty ambulating for a while.  She recently just got her rehab      Per triage, patient's initial sats were in the 80s.  Patient placed 3L NC and sats up to 90%  Review of Systems     Constitutional: No fever or chills   Eyes: No visual symptoms   ENT: No sore throat, runny nose, or other URI symptoms   Respiratory: As noted above   Cardiovascular: No chest pain   Gastrointestinal: No nausea, vomiting, diarrhea or abdominal pain   Genitourinary: No dysuria or hematuria   Musculoskeletal: As noted above   Integumentary: No rashes   Neurological: No headaches   Psychiatric: No HI or SI        Past Medical/Surgical History          Past Medical History:        Diagnosis  Date         ?  Arthritis       ?  Chronic pain syndrome            related to R hip replacement         ?  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  DM2 (diabetes mellitus, type 2) (HCC)       ?  GERD (gastroesophageal reflux disease)       ?  Hepatitis C            HEP C         ?  HTN (hypertension)       ?  Lung nodule, multiple            (CT 10/2016) Small left upper lobe and 1.7 x 1.6 cm left lower lobe nodules not significantly changed from 07/23/2016 and 03/22/2016         ?  Marijuana use       ?  PTSD (post-traumatic stress disorder)            lived through the [place]         ?  Thoracic ascending aortic aneurysm (HCC)            4.4cm  noted on CT Chest (10/2016)         ?  Thyroid nodule            2.5 cm stable right thyroid nodule (CT 10/2016)          Past Surgical History:         Procedure  Laterality  Date          ?  HX ENDOSCOPY         ?  HX HERNIA REPAIR    2019          x2          ?  HX HIP REPLACEMENT  Right  01/29/2015     ?  HX HIP REPLACEMENT         ?  HX HYSTERECTOMY    2010          hysterectomy             Social History          Social History          Socioeconomic History         ?  Marital status:  MARRIED       Tobacco Use         ?  Smoking status:  Current Some Day Smoker     ?  Smokeless tobacco:  Never Used        ?  Tobacco comment: reported as quit smoking around 1990s per spouse       Substance and Sexual Activity         ?  Alcohol use:  No     ?  Drug use:  Yes              Types:  Marijuana             Comment: reports smoking last week-marijuana         ?  Sexual activity:  Never             Family History          Family History         Problem  Relation  Age of Onset          ?  Hypertension  Mother       ?  Other  Mother                OSA          ?  Cancer  Father                suspected leukemia per pt's spouse          ?  Cancer  Maternal Aunt            ?  Alcohol abuse  Maternal Grandfather               Home Medications          Prior to Admission Medications     Prescriptions  Last Dose  Informant  Patient Reported?  Taking?      LANTUS SOLOSTAR U-100 INSULIN 100 unit/mL (3 mL) inpn      No  Yes      Sig: INJECT 32 UNITS SUBCUTANEOUSLY  NIGHTLY      QUEtiapine SR (SEROQUEL XR) 150 mg sr tablet      Yes  Yes      Sig: Take 150 mg by mouth nightly as needed.      acetaminophen (TYLENOL) 325 mg tablet  Not Taking at Unknown time    No  No      Sig: Take 2 Tabs by mouth every four (4) hours as needed for Pain.      Patient not taking: Reported on 05/26/2020      albuterol (ACCUNEB) 1.25 mg/3 mL nebu      No  Yes      Sig: USE 1 VIAL IN NEBULIZER EVERY 6 HOURS AS NEEDED FOR  SHORTNESS  OF  BREATH,   WHEEZING      albuterol (PROVENTIL HFA, VENTOLIN HFA, PROAIR HFA) 90 mcg/actuation inhaler      No  Yes      Sig: Take 2 Puffs by inhalation every four (4) hours as needed for Wheezing.      allopurinoL (ZYLOPRIM) 100 mg tablet      No  Yes      Sig: Take 1 tablet by mouth twice daily      amLODIPine (NORVASC) 5 mg tablet      Yes  Yes      Sig: as directed      butalbital-acetaminophen-caffeine (FIORICET, ESGIC) 50-325-40 mg per tablet      Yes  Yes      Sig: butalbital-acetaminophen-caffeine 50 mg-325 mg-40 mg tablet    TAKE 1 TABLET BY MOUTH EVERY  4 HOURS AS NEEDED FOR HEADACHE      cetirizine (ZYRTEC) 10 mg tablet      No  Yes      Sig: Take 1 Tab by mouth daily.      cloNIDine HCl (CATAPRES) 0.1 mg tablet      No  Yes      Sig: Take 3 Tabs by mouth two (2) times a day.      dilTIAZem CD (CARDIZEM CD) 240 mg ER capsule      No  Yes      Sig: Take 1 Cap by mouth daily.      fluticasone propion-salmeterol (ADVAIR DISKUS) 100-50 mcg/dose diskus inhaler      No  Yes      Sig: Take 1 Puff by inhalation two (2) times a day.      fluticasone propionate (FLONASE) 50 mcg/actuation nasal spray  Not Taking at Unknown time    No  No      Sig: 2 Sprays by Both Nostrils route daily.      Patient not taking: Reported on 05/26/2020      furosemide (LASIX) 20 mg tablet      Yes  Yes      Sig: furosemide 20 mg tablet      gabapentin (NEURONTIN) 300 mg capsule      No  Yes      Sig: Take 1 Cap by mouth three (3) times daily. Max Daily Amount: 900 mg.      guaiFENesin ER (MUCINEX) 600 mg ER tablet      No  Yes      Sig: Take 1 Tab by mouth every twelve (12) hours.      ibuprofen (MOTRIN) 600 mg tablet  Not Taking at Unknown time    No  No      Sig: Take 1 Tab by mouth every six (6) hours as needed for Pain.      Patient not taking: Reported on 05/26/2020      ketorolac (TORADOL) 30 mg/mL (1 mL) injection  Not Taking at Unknown time    Yes  No      Sig: ketorolac 30 mg/mL (1 mL) injection solution    inject 49mL now in office       Patient not taking: Reported on 05/26/2020      losartan (COZAAR) 50 mg tablet      No  Yes      Sig: Take 1 Tab by mouth daily.      magnesium oxide (MAG-OX) 400 mg tablet      Yes  Yes      Sig: Take 400 mg by mouth daily.      metFORMIN ER (GLUCOPHAGE XR) 500 mg tablet      No  Yes      Sig: TAKE 1 TABLET TWICE DAILY      methocarbamoL (Robaxin) 500 mg tablet  Not Taking at Unknown time    No  No      Sig: Take 1 Tab by mouth four (4) times daily. As needed for muscle pain or spasm      Patient not taking: Reported on 05/26/2020      montelukast (SINGULAIR) 10 mg tablet      No  Yes      Sig: TAKE 1 TABLET EVERY DAY      polyethylene glycol (MIRALAX) 17 gram packet  Not Taking at Unknown time    No  No  Sig: Take 1 Packet by mouth daily.      Patient not taking: Reported on 05/26/2020      potassium chloride SR (K-TAB) 20 mEq tablet      Yes  Yes      Sig: potassium chloride ER 20 mEq tablet,extended release    TAKE 1 TABLET BY MOUTH ONCE DAILY      rosuvastatin (CRESTOR) 5 mg tablet      No  Yes      Sig: TAKE 1 TABLET BY MOUTH NIGHTLY      tiotropium bromide (SPIRIVA RESPIMAT) 2.5 mcg/actuation inhaler      No  Yes      Sig: Take 1 Puff by inhalation daily.               Facility-Administered Medications: None             Allergies          Allergies        Allergen  Reactions         ?  Chocolate [Cocoa]  Sneezing             Confirms allergy but reports "I still eat it"             Physical Exam        Visit Vitals      BP  (!) 135/98     Pulse  100     Temp  98.9 ??F (37.2 ??C)     Resp  15     Ht  5\' 7"  (1.702 m)     Wt  83.9 kg (185 lb)     SpO2  99%        BMI  28.98 kg/m??        General appearance: Well developed, well nourished female .  Moderate respiratory distress   Eyes: Conjunctivae clear, non-icteric   ENT: Ears/Nose: Hearing is grossly intact to voice. Internal and external examinations of the ears and nose are unremarkable.   Respiratory: Scattered expiratory wheezing, decreased lung sounds of  the lower bases, moderate respiratory distress, patient tachypneic and  speaking in short phrases   Cardiovascular: Mildly tachycardic but regular rhythm   GI: Abdomen is soft, non-tender   Musculoskeletal: Patient able to move all four extremities. No peripheral edema or posterior calf tenderness bilaterally   Skin: Warm and dry without rashes   Neurologic: Alert, oriented. Answers questions appropriately        Impression and Management Plan     This is a 69 y.o.  female with history of DM, HTN, COPD on 3L NC who presents to the ED in respiratory distress.  Labs, EKG, chest x-ray obtained prior to my evaluation. Will place patient on BiPAP.        Diagnostic Studies     Lab:      Labs Reviewed       CBC WITH AUTOMATED DIFF - Abnormal; Notable for the following components:            Result  Value            WBC  14.7 (*)         MPV  12.5 (*)         NEUTROPHILS  82.2 (*)         LYMPHOCYTES  10.4 (*)            All other components within normal limits  METABOLIC PANEL, BASIC - Abnormal; Notable for the following components:            Sodium  133 (*)         Chloride  97 (*)         CO2  35 (*)         Glucose  374 (*)         Anion gap  1 (*)            All other components within normal limits       POC BLOOD GAS + LACTIC ACID - Abnormal; Notable for the following components:            pH  7.330 (*)         PCO2  65.6 (*)         PO2  114.0 (*)         BICARBONATE  34.5 (*)         CO2, TOTAL  36.0 (*)         BASE EXCESS  9 (*)            All other components within normal limits       NT-PRO BNP     TROPONIN-HIGH SENSITIVITY     BLOOD GAS, ARTERIAL       PROTHROMBIN TIME + INR           Initial EKG: interpreted by Konrad Felix, MD shows A. fib with RVR, rate 103, intervals within normal limits, no evidence of acute ischemia      Repeat EKG: interpreted by Konrad Felix, MD shows normal sinus rhythm, rate 100, intervals within normal limits, no evidence of acute ischemia      Imaging:        Based  on my interpretation the chest x-ray shows no acute infiltrate      XR CHEST PA LAT      Result Date: 05/26/2020   Exam: PA lateral chest Clinical indication: SOB Comparison: 08/22/2019;  Results:  Decreasing left mid chest nodularity, 2.2 x 1.8 cm. Previously 7 x 4 cm. No pneumothorax. Marked left upper lobe emphysema. Heart mildly enlarged.  Mediastinal contours  are normal. No free air is seen under the hemidiaphragms.   Osseous structures intact.       IMPRESSION:  Decreasing left mid chest nodularity, 2.2 x 1.8 cm. Previously 7 x 4 cm. Marked left upper lobe emphysema.         ED Course          ED Course as of 05/26/20 1505       Tue May 26, 2020        1336  WBC(!): 14.7 [AA]     1336  HGB: 13.5 [AA]     1336  HCT: 42.6 [AA]     1336  Troponin-High Sensitivity: 5 [AA]     1359  Sodium(!): 133 [AA]     1359  Chloride(!): 97 [AA]     1359  CO2(!): 35 [AA]     1359  Glucose(!): 374 [AA]     1359  BUN: 12 [AA]     1359  Creatinine: 1.0 [AA]     1400  NT pro-BNP: 9.0 [AA]              ED Course User Index   [AA] Lyndi Holbein R, PA           Patient much better on  BiPAP.  CBC with no leukocytosis and no anemia.  BMP with mild hyponatremia, CO2 35 otherwise unremarkable.  EKG and initial troponin unremarkable for ACS.  proBNP within normal limits.  Lactic acid within normal limits.  Patient  understands she will need admission for further management.  Dr. Henrene Hawking spoke with Dr. Corinda Gubler who agreed admit the patient under his service.  He also spoke with Dr. Grayland Jack who agreed to consult on the patient.        Medications       albuterol-ipratropium (DUO-NEB) 2.5 MG-0.5 MG/3 ML (has no administration in time range)       methylPREDNISolone (PF) (Solu-MEDROL) injection 125 mg (125 mg IntraVENous Given 05/26/20 1442)          Final Diagnosis                 ICD-10-CM  ICD-9-CM          1.  Acute on chronic respiratory failure with hypoxia (HCC)   J96.21  518.84             799.02          2.  COPD exacerbation (HCC)    J44.1  491.21          Disposition     Admission         The patient was personally evaluated by myself and Konrad Felix, MD who agrees with the above assessment and plan         Asencion Islam, PA-C   May 26, 2020         [facility] medical dictation software was used for portions of this report. Unintended errors may occur.          My signature above authenticates this document and my orders, the final     diagnosis (es), discharge prescription (s), and instructions in the Epic     record.   If you have any questions please contact [phone].       Nursing notes have been reviewed by the physician/ advanced practice     Clinician.

## 2020-05-26 NOTE — Progress Notes (Signed)
PAGER ID: [phone]   MESSAGE: Dr Corinda Gubler [phone] [facility].Dx COPD, pt is requesting her Seroquel for tonight. on her PTA meds list. Thanks you. shamir [id number].    Dr Sharlot Gowda back order to resume the seroquel tomorrow.

## 2020-05-26 NOTE — Consults (Signed)
[facility]       Pulmonary Consult Note    Patient: [name] [name]               Sex: female          DOA: 05/26/2020       Date of Birth:  06/19/1950      Age:  69 y.o.            Consult requested by Dr.Verma  Reason for Consult: resp failure    ASSESSMENT/PLAN:   -Acute on chronic hypoxic and hypercapnic respiratory failure.  Requiring BiPAP.  Work of breathing much improved with BiPAP.  Continue BiPAP.  Nightly.  Titrate effort to keep sats more than 88%.    -COPD/asthma exacerbation.  Will treat with steroids and neb treatments.    -Allergic rhinitis.  Continue cetirizine, Flonase and Singulair.    -COPD.  On Advair and Spiriva Respimat at home.  Sees Dr. Helene ShoeBirk as outpatient.    -Acute on chronic diastolic CHF with lower extremity and upper extremity edema..  Echo 05/2019???EF 63%, grade 1 diastolic dysfunction.  On home Lasix.  proBNP 644.  Start Lasix 20 mg IV every 12.    -Hypertension.  On Cardizem, losartan and Norvasc.    -Hyperlipidemia    -1.7 cm right lower lobe nodule.  Stable on CT from May 2018 to March 2020.  Seen on x-ray 05/27/2019.  Follow-up with Dr. Helene ShoeBirk as outpatient.    -Continue heparin for DVT prophylaxis.    -Discussed with patient, RN, IM, husband.  Admit to stepdown unit.  CC time 32 minutes.    HPI:   [name] [name] is a 69 y.o. female with history of diabetes mellitus, hypertension, COPD on 3 L nasal cannula presents with extreme shortness of breath, nonproductive cough.  No fevers no chills no chest pain.  Initial sats on presentation per triage is 80s.  Placed on 3 L and sats improved to 90%.  Placed on BiPAP and her work of breathing much improved.  proBNP normal.  Lactic acid normal.  WBC 14.7.  CO2 35.  ABG 7.33, pCO2 65.6, PO2 114 bicarb 36  Chest x-ray with 2.2 cm left mid chest nodule???was 7 cm previously.  Positive emphysema.  Also complains of lower extremity swelling and upper extremity swelling per husband.  Smokes several years ago quit 40  years ago.  Was exposed to dust and fumes off [employer]  Past Medical History  Past Medical History:   Diagnosis Date   ??? Arthritis    ??? 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 DM2 (diabetes mellitus, type 2) (HCC)    ??? GERD (gastroesophageal reflux disease)    ??? Hepatitis C     HEP C   ??? HTN (hypertension)    ??? Lung nodule, multiple     (CT 10/2016) Small left upper lobe and 1.7 x 1.6 cm left lower lobe nodules not significantly changed from 07/23/2016 and 03/22/2016   ??? Marijuana use    ??? PTSD (post-traumatic stress disorder)     [place]   ??? Thoracic ascending aortic aneurysm (HCC)     4.4cm noted on CT Chest (10/2016)   ??? Thyroid nodule     2.5 cm stable right thyroid nodule (CT 10/2016)  Past Surgical History  Past Surgical History:   Procedure Laterality Date   ??? HX ENDOSCOPY     ??? HX HERNIA REPAIR  2019    x2   ??? HX HIP REPLACEMENT Right 01/29/2015   ??? HX HIP REPLACEMENT     ??? HX HYSTERECTOMY  2010    hysterectomy       Family History  Family History   Problem Relation Age of Onset   ??? Hypertension Mother    ??? Other Mother         OSA   ??? Cancer Father         suspected leukemia per pt's spouse   ??? Cancer Maternal Aunt    ??? Alcohol abuse Maternal Grandfather        Social History  Social History     Tobacco Use   ??? Smoking status: Current Some Day Smoker   ??? Smokeless tobacco: Never Used   ??? Tobacco comment: reported as quit smoking around 1990s per spouse   Substance Use Topics   ??? Alcohol use: No   ??? Drug use: Yes     Types: Marijuana     Comment: reports smoking last week-marijuana        Medications  Prior to Admission medications    Medication Sig Start Date End Date Taking? Authorizing Provider   allopurinoL (ZYLOPRIM) 100 mg tablet Take 1 tablet by mouth twice daily 05/12/20  Yes Burtis Junes R, PA-C   amLODIPine (NORVASC) 5 mg tablet as directed   Yes  Provider, Historical   butalbital-acetaminophen-caffeine (FIORICET, ESGIC) 50-325-40 mg per tablet butalbital-acetaminophen-caffeine 50 mg-325 mg-40 mg tablet   TAKE 1 TABLET BY MOUTH EVERY 4 HOURS AS NEEDED FOR HEADACHE   Yes Provider, Historical   furosemide (LASIX) 20 mg tablet furosemide 20 mg tablet   Yes Provider, Historical   potassium chloride SR (K-TAB) 20 mEq tablet potassium chloride ER 20 mEq tablet,extended release   TAKE 1 TABLET BY MOUTH ONCE DAILY   Yes Provider, Historical   albuterol (PROVENTIL HFA, VENTOLIN HFA, PROAIR HFA) 90 mcg/actuation inhaler Take 2 Puffs by inhalation every four (4) hours as needed for Wheezing. 07/20/19  Yes Digeronimo, Gabriel Rung, PA-C   dilTIAZem CD (CARDIZEM CD) 240 mg ER capsule Take 1 Cap by mouth daily. 09/07/18  Yes [name], MD   losartan (COZAAR) 50 mg tablet Take 1 Tab by mouth daily. 09/07/18  Yes [name], MD   cetirizine (ZYRTEC) 10 mg tablet Take 1 Tab by mouth daily. 09/08/18  Yes [name], MD   guaiFENesin ER [facility]) 600 mg ER tablet Take 1 Tab by mouth every twelve (12) hours. 09/07/18  Yes [name], MD   LANTUS SOLOSTAR U-100 INSULIN 100 unit/mL (3 mL) inpn INJECT 32 UNITS SUBCUTANEOUSLY  NIGHTLY 06/12/18  Yes Brock, Monique, PA-C   albuterol (ACCUNEB) 1.25 mg/3 mL nebu USE 1 VIAL IN NEBULIZER EVERY 6 HOURS AS NEEDED FOR  SHORTNESS  OF  BREATH,  WHEEZING 05/30/18  Yes Mal Amabile, Monique, PA-C   cloNIDine HCl (CATAPRES) 0.1 mg tablet Take 3 Tabs by mouth two (2) times a day. 05/20/18  Yes [name], PA-C   metFORMIN ER (GLUCOPHAGE XR) 500 mg tablet TAKE 1 TABLET TWICE DAILY 03/30/18  Yes Mal Amabile, Monique, PA-C   montelukast (SINGULAIR) 10 mg tablet TAKE 1 TABLET EVERY DAY 03/26/18  Yes [name], PA-C   gabapentin (NEURONTIN) 300 mg capsule Take 1 Cap by mouth three (3)  times daily. Max Daily Amount: 900 mg. 03/01/18  Yes Mal Amabile, Monique, PA-C   magnesium oxide (MAG-OX) 400 mg tablet Take 400 mg by mouth daily.   Yes Provider, Historical    QUEtiapine SR (SEROQUEL XR) 150 mg sr tablet Take 150 mg by mouth nightly as needed.   Yes Provider, Historical   tiotropium bromide (SPIRIVA RESPIMAT) 2.5 mcg/actuation inhaler Take 1 Puff by inhalation daily. 10/11/17  Yes Holladay, Versie Starks, MD   fluticasone propion-salmeterol (ADVAIR DISKUS) 100-50 mcg/dose diskus inhaler Take 1 Puff by inhalation two (2) times a day. 10/11/17  Yes Holladay, Versie Starks, MD   rosuvastatin (CRESTOR) 5 mg tablet TAKE 1 TABLET BY MOUTH NIGHTLY 07/13/17  Yes Littie Deeds, MD   ketorolac (TORADOL) 30 mg/mL (1 mL) injection ketorolac 30 mg/mL (1 mL) injection solution   inject 1mL now in office  Patient not taking: Reported on 05/26/2020    Provider, Historical   acetaminophen (TYLENOL) 325 mg tablet Take 2 Tabs by mouth every four (4) hours as needed for Pain.  Patient not taking: Reported on 05/26/2020 07/20/19   Digeronimo, Gabriel Rung, PA-C   methocarbamoL (Robaxin) 500 mg tablet Take 1 Tab by mouth four (4) times daily. As needed for muscle pain or spasm  Patient not taking: Reported on 05/26/2020 07/20/19   Digeronimo, Gabriel Rung, PA-C   ibuprofen (MOTRIN) 600 mg tablet Take 1 Tab by mouth every six (6) hours as needed for Pain.  Patient not taking: Reported on 05/26/2020 07/05/19   Loa Socks, MD   fluticasone propionate (FLONASE) 50 mcg/actuation nasal spray 2 Sprays by Both Nostrils route daily.  Patient not taking: Reported on 05/26/2020 09/07/18   [name], MD   polyethylene glycol [facility]) 17 gram packet Take 1 Packet by mouth daily.  Patient not taking: Reported on 05/26/2020 02/24/18   Lawana Chambers, MD       Current Facility-Administered Medications   Medication Dose Route Frequency   ??? albuterol-ipratropium (DUO-NEB) 2.5 MG-0.5 MG/3 ML  3 mL Nebulization TID RT   ??? albuterol (ACCUNEB) nebulizer solution 2.5 mg  2.5 mg Nebulization Q4H PRN   ??? allopurinoL (ZYLOPRIM) tablet 100 mg  100 mg Oral BID   ??? [START ON 05/27/2020] amLODIPine (NORVASC) tablet 5 mg  5 mg  Oral DAILY   ??? dilTIAZem ER (CARDIZEM CD) capsule 240 mg  240 mg Oral DAILY   ??? [START ON 05/27/2020] fluticasone-vilanterol (BREO ELLIPTA) 112mcg-25mcg/puff  1 Puff Inhalation DAILY   ??? [Held by provider] gabapentin (NEURONTIN) capsule 300 mg  300 mg Oral TID   ??? [START ON 05/27/2020] montelukast (SINGULAIR) tablet 10 mg  10 mg Oral DAILY   ??? [START ON 05/27/2020] losartan (COZAAR) tablet 50 mg  50 mg Oral DAILY   ??? rosuvastatin (CRESTOR) tablet 5 mg  5 mg Oral QHS   ??? [START ON 05/27/2020] tiotropium bromide (SPIRIVA RESPIMAT) 2.5 mcg /actuation  1 Puff Inhalation DAILY   ??? naloxone (NARCAN) injection 0.1 mg  0.1 mg IntraVENous PRN   ??? acetaminophen (TYLENOL) tablet 650 mg  650 mg Oral Q4H PRN    Or   ??? acetaminophen (TYLENOL) solution 650 mg  650 mg Oral Q4H PRN    Or   ??? acetaminophen (TYLENOL) suppository 650 mg  650 mg Rectal Q4H PRN   ??? ondansetron (ZOFRAN) injection 4 mg  4 mg IntraVENous Q4H PRN   ??? PHOSPHATE ELECTROLYTE REPLACEMENT PROTOCOL STANDARD DOSING  1 Each Other PRN   ??? MAGNESIUM  ELECTROLYTE REPLACEMENT PROTOCOL STANDARD DOSING  1 Each Other PRN   ??? POTASSIUM ELECTROLYTE REPLACEMENT PROTOCOL STANDARD DOSING  1 Each Other PRN   ??? CALCIUM ELECTROLYTE REPLACEMENT PROTOCOL STANDARD DOSING  1 Each Other PRN   ??? heparin (porcine) injection 5,000 Units  5,000 Units SubCUTAneous Q8H   ??? methylPREDNISolone (PF) (Solu-MEDROL) injection 40 mg  40 mg IntraVENous Q8H   ??? dextrose (D50) infusion 5-25 g  10-50 mL IntraVENous PRN   ??? glucagon (GLUCAGEN) injection 1 mg  1 mg IntraMUSCular PRN   ??? insulin glargine (LANTUS) injection 1-100 Units  1-100 Units SubCUTAneous QHS   ??? insulin lispro (HUMALOG) injection 1-100 Units  1-100 Units SubCUTAneous AC&HS   ??? insulin lispro (HUMALOG) injection 1-100 Units  1-100 Units SubCUTAneous PRN     Current Outpatient Medications   Medication Sig   ??? allopurinoL (ZYLOPRIM) 100 mg tablet Take 1 tablet by mouth twice daily   ??? amLODIPine (NORVASC) 5 mg tablet as directed   ???  butalbital-acetaminophen-caffeine (FIORICET, ESGIC) 50-325-40 mg per tablet butalbital-acetaminophen-caffeine 50 mg-325 mg-40 mg tablet   TAKE 1 TABLET BY MOUTH EVERY 4 HOURS AS NEEDED FOR HEADACHE   ??? furosemide (LASIX) 20 mg tablet furosemide 20 mg tablet   ??? potassium chloride SR (K-TAB) 20 mEq tablet potassium chloride ER 20 mEq tablet,extended release   TAKE 1 TABLET BY MOUTH ONCE DAILY   ??? albuterol (PROVENTIL HFA, VENTOLIN HFA, PROAIR HFA) 90 mcg/actuation inhaler Take 2 Puffs by inhalation every four (4) hours as needed for Wheezing.   ??? dilTIAZem CD (CARDIZEM CD) 240 mg ER capsule Take 1 Cap by mouth daily.   ??? losartan (COZAAR) 50 mg tablet Take 1 Tab by mouth daily.   ??? cetirizine (ZYRTEC) 10 mg tablet Take 1 Tab by mouth daily.   ??? guaiFENesin ER (MUCINEX) 600 mg ER tablet Take 1 Tab by mouth every twelve (12) hours.   ??? LANTUS SOLOSTAR U-100 INSULIN 100 unit/mL (3 mL) inpn INJECT 32 UNITS SUBCUTANEOUSLY  NIGHTLY   ??? albuterol (ACCUNEB) 1.25 mg/3 mL nebu USE 1 VIAL IN NEBULIZER EVERY 6 HOURS AS NEEDED FOR  SHORTNESS  OF  BREATH,  WHEEZING   ??? cloNIDine HCl (CATAPRES) 0.1 mg tablet Take 3 Tabs by mouth two (2) times a day.   ??? metFORMIN ER (GLUCOPHAGE XR) 500 mg tablet TAKE 1 TABLET TWICE DAILY   ??? montelukast (SINGULAIR) 10 mg tablet TAKE 1 TABLET EVERY DAY   ??? gabapentin (NEURONTIN) 300 mg capsule Take 1 Cap by mouth three (3) times daily. Max Daily Amount: 900 mg.   ??? magnesium oxide (MAG-OX) 400 mg tablet Take 400 mg by mouth daily.   ??? QUEtiapine SR (SEROQUEL XR) 150 mg sr tablet Take 150 mg by mouth nightly as needed.   ??? tiotropium bromide (SPIRIVA RESPIMAT) 2.5 mcg/actuation inhaler Take 1 Puff by inhalation daily.   ??? fluticasone propion-salmeterol (ADVAIR DISKUS) 100-50 mcg/dose diskus inhaler Take 1 Puff by inhalation two (2) times a day.   ??? rosuvastatin (CRESTOR) 5 mg tablet TAKE 1 TABLET BY MOUTH NIGHTLY   ??? ketorolac (TORADOL) 30 mg/mL (1 mL) injection ketorolac 30 mg/mL (1 mL) injection  solution   inject 83mL now in office (Patient not taking: Reported on 05/26/2020)   ??? acetaminophen (TYLENOL) 325 mg tablet Take 2 Tabs by mouth every four (4) hours as needed for Pain. (Patient not taking: Reported on 05/26/2020)   ??? methocarbamoL (Robaxin) 500 mg tablet Take 1 Tab by mouth four (  4) times daily. As needed for muscle pain or spasm (Patient not taking: Reported on 05/26/2020)   ??? ibuprofen (MOTRIN) 600 mg tablet Take 1 Tab by mouth every six (6) hours as needed for Pain. (Patient not taking: Reported on 05/26/2020)   ??? fluticasone propionate (FLONASE) 50 mcg/actuation nasal spray 2 Sprays by Both Nostrils route daily. (Patient not taking: Reported on 05/26/2020)   ??? polyethylene glycol (MIRALAX) 17 gram packet Take 1 Packet by mouth daily. (Patient not taking: Reported on 05/26/2020)       Allergy  Allergies   Allergen Reactions   ??? Chocolate [Cocoa] Sneezing     Confirms allergy but reports "I still eat it"       Review of Systems  CONSTITUTIONAL: Negative for fever, chills or weight loss.  HEENT: Negative for headaches, nosebleeds, or sore throat.   EYES: negative for blurry vision or double vision.   SKIN: Negative for rash or itching.   RESPIRATORY: As per HPI.   CARDIOVASCULAR: Negative for lower extremity swelling, orthopnea, or palpitations.   GASTROINTESTINAL: Negative for nausea, vomiting or diarrhea.   GENITOURINARY: Negative for dysuria, frequency, or urgency.   MUSCULOSKELETAL: Negative for myalgia, back pain or joint pain.   HEMATOLOGICAL: Negative for easy bleeding or easy bruising.   NEUROLOGICAL: Negative for seizures, tremors, or numbness.   PSYCHIATRIC: Negative for depression or suicidal ideations.    Physical Exam:   Vital Signs:    Visit Vitals  BP (!) 135/98   Pulse 100   Temp 98.9 ??F (37.2 ??C)   Resp 15   Ht 5\' 7"  (1.702 m)   Wt 83.9 kg (185 lb)   SpO2 99%   BMI 28.98 kg/m??       O2 Device: BIPAP, Heated   O2 Flow Rate (L/min): 3 l/min   Temp (24hrs), Avg:98.4 ??F (36.9 ??C),  Min:97.9 ??F (36.6 ??C), Max:98.9 ??F (37.2 ??C)       Intake/Output:   Last shift:      No intake/output data recorded.  Last 3 shifts: No intake/output data recorded.  No intake or output data in the 24 hours ending 05/26/20 1530     GENERAL: On BiPAP.  Decreased work of breathing.  HEENT: Normocephalic and atraumatic head. Oral mucosa moist. No thrush.   Eyes: Pupils are equal, reactive to light and accommodation. No icterus.   NECK: Supple. Trachea midline.   RESPIRATORY: Bilateral poor air entry.  Positive chest tightness.  No crepitus or wheeze.    CARDIOVASCULAR: S1 and S2 present, regular. No murmur, rub, or thrill.   ABDOMEN: Soft and nontender with positive bowel sounds. No organomegaly.   NEUROLOGICAL: Alert and oriented. No focal weakness.   Extremities.  +2 lower extremity pitting edema.    Labs Reviewed:  Recent Results (from the past 48 hour(s))   EKG, 12 LEAD, INITIAL    Collection Time: 05/26/20 11:06 AM   Result Value Ref Range    Ventricular Rate 103 BPM    Atrial Rate 288 BPM    QRS Duration 68 ms    Q-T Interval 332 ms    QTC Calculation (Bezet) 434 ms    Calculated R Axis 32 degrees    Calculated T Axis 52 degrees    Diagnosis       Atrial fibrillation with rapid ventricular response  Abnormal ECG  When compared with ECG of 22-Aug-2019 13:40,  Atrial fibrillation has replaced Sinus rhythm  Nonspecific T wave abnormality no longer evident in Inferior  leads  Nonspecific T wave abnormality no longer evident in Anterolateral leads  Confirmed by Valentina Lucks, M.D., John ([id number]) on 05/26/2020 12:52:19 PM     NT-PRO BNP    Collection Time: 05/26/20 11:20 AM   Result Value Ref Range    NT pro-BNP 9.0 0.0 - 125.0 pg/ml   CBC WITH AUTOMATED DIFF    Collection Time: 05/26/20 11:20 AM   Result Value Ref Range    WBC 14.7 (H) 4.0 - 11.0 1000/mm3    RBC 4.97 3.60 - 5.20 M/uL    HGB 13.5 13.0 - 17.2 gm/dl    HCT 95.1 88.4 - 16.6 %    MCV 85.7 80.0 - 98.0 fL    MCH 27.2 25.4 - 34.6 pg    MCHC 31.7 30.0 - 36.0 gm/dl     PLATELET 063 016 - 450 1000/mm3    MPV 12.5 (H) 6.0 - 10.0 fL    RDW-SD 45.3 36.4 - 46.3      NRBC 0 0 - 0      IMMATURE GRANULOCYTES 0.6 0.0 - 3.0 %    NEUTROPHILS 82.2 (H) 34 - 64 %    LYMPHOCYTES 10.4 (L) 28 - 48 %    MONOCYTES 5.6 1 - 13 %    EOSINOPHILS 1.0 0 - 5 %    BASOPHILS 0.2 0 - 3 %   METABOLIC PANEL, BASIC    Collection Time: 05/26/20 11:20 AM   Result Value Ref Range    Sodium 133 (L) 136 - 145 mEq/L    Potassium 3.8 3.5 - 5.1 mEq/L    Chloride 97 (L) 98 - 107 mEq/L    CO2 35 (H) 21 - 32 mEq/L    Glucose 374 (H) 74 - 106 mg/dl    BUN 12 7 - 25 mg/dl    Creatinine 1.0 0.6 - 1.3 mg/dl    GFR est AA >01.0      GFR est non-AA 59      Calcium 9.8 8.5 - 10.1 mg/dl    Anion gap 1 (L) 5 - 15 mmol/L   TROPONIN-HIGH SENSITIVITY    Collection Time: 05/26/20 11:20 AM   Result Value Ref Range    Troponin-High Sensitivity 5 0 - 59 ng/L   POC BLOOD GAS + LACTIC ACID    Collection Time: 05/26/20  2:33 PM   Result Value Ref Range    pH 7.330 (L) 7.350 - 7.450      PCO2 65.6 (HH) 35.0 - 45.0 mm Hg    PO2 114.0 (H) 75 - 100 mm Hg    BICARBONATE 34.5 (HH) 18.0 - 26.0 mmol/L    O2 SAT 98.0 90 - 100 %    CO2, TOTAL 36.0 (H) 24 - 29 mmol/L    Lactic Acid 0.59 0.40 - 2.00 mmol/L    BASE EXCESS 9 (H) -2 - 3 mmol/L    Patient temp. 37.2 C      Sample type Art      FIO2 40      SITE R Radial      DEVICE BiPAP      ALLENS TEST Pass      IPAP/PIP 15      EPAP/CPAP/PEEP 5      Respiratory Rate 18      SET RATE 10      VTEXP 429                       Imaging:  Results from Hospital Encounter encounter on 05/26/20    XR CHEST PA LAT    Narrative  Exam: PA lateral chest    Clinical indication: SOB    Comparison: 08/22/2019;    Results:  Decreasing left mid chest nodularity, 2.2 x 1.8 cm. Previously 7 x 4  cm.  No pneumothorax. Marked left upper lobe emphysema.    Heart mildly enlarged.  Mediastinal contours are normal.    No free air is seen under the hemidiaphragms.   Osseous structures intact.    Impression  IMPRESSION:  Decreasing  left mid chest nodularity, 2.2 x 1.8 cm. Previously 7 x  4 cm. Marked left upper lobe emphysema.      Results from Hospital Encounter encounter on 06/01/19    CT ABD PELV W CONT    Narrative  EXAM: CT ABD PELV W CONT    INDICATION: LLQ abd pain    TECHNIQUE: Axial CT scan of the abdomen and pelvis with   IV contrast  including coronal and sagittal reconstructions.  DICOM format image data is available to non-affiliated external healthcare  facilities or entities on a secure, media free, reciprocally searchable basis  with patient authorization for 12 months following the date of the study.      COMPARISON: 05/07/2019    FINDINGS:  Lower thorax: Bibasilar bullae.    ABDOMEN:  Liver: Unremarkable. No mass.  Gallbladder and bile ducts: Unremarkable. No calcified stones. No ductal  dilatation.  Pancreas: Unremarkable. No ductal dilatation. No mass.  Spleen: Multiple unchanged splenic lesions.  Adrenals: Unremarkable. No mass.  Kidneys and ureters: 2 mm nonobstructing right kidney stone.  Appendix: No findings to suggest appendicitis.  Stomach and bowel: Stomach unremarkable. No obstruction. No mucosal thickening.  No inflammatory changes. Multiple sigmoid diverticula without diverticulitis.  Peritoneum: Unremarkable. No significant fluid collection. No free air.  Lymph nodes: Unremarkable. No enlarged lymph nodes.  Vasculature: Unremarkable. No aortic aneurysm.  Bones: Total right hip replacement.  Abdominal wall: Unremarkable. No evidence of a hernia.    PELVIS:  Bladder: Unremarkable. No mass.  Reproductive: Hysterectomy.    Impression  IMPRESSION:    1. Multiple sigmoid diverticula without diverticulitis.  2. 2 mm nonobstructing right kidney stone.  3. Total right hip replacement.  4. Hysterectomy.           Zoe Lan, MD  [phone]      Dragon medical dictation software was used for portions of this report.  Unintended voice transcription errors may have occurred.

## 2020-05-26 NOTE — Progress Notes (Signed)
Problem: Breathing Pattern - Ineffective  Goal: *Absence of hypoxia  Outcome: Progressing Towards Goal  Goal: *Use of effective breathing techniques  Outcome: Progressing Towards Goal   RESPIRATORY  TRANSFER REPORT    The patient has been transferred from ED to 3CDUwithout incident.     The patient is currently on 3LNC with the BiPAP on standby for QHS and PRN. Pt has orders for DUOneb Q4.    The patient has an admission diagnosis of COPD exacerbation (HCC) [J44.1]  Hypoxia [R09.02] and a history of PTSD     Current BBS wheezing  Visit Vitals  BP (!) 137/95   Pulse (!) 101   Temp 98.9 F (37.2 C)   Resp 15   Ht 5\' 7"  (1.702 m)   Wt 83.9 kg (185 lb)   SpO2 95%   BMI 28.98 kg/m     Pt understands the plan to wear the BiPAP overnight.       Signed By: Laurin Coder, RT     May 26, 2020

## 2020-05-27 LAB — CBC WITH AUTO DIFFERENTIAL
ACANTHROCYTES: 0
Anisocytosis: 0
BASOPHIL.STIP-DIF,2024: 0
Basophils %: 0 % (ref 0–3)
CODOCYTES, CODOCYT: 0
CRENATED RBCS: 0
DACROCYTES, DACRO: 0
DREPANOCYTES, DREPAN: 0
ECHINOCYTES, ECHINO: 0
ELLIPTOCYTES: 0
Eosinophils %: 0 % (ref 0–5)
GIANT PLATELETS: 1
HELMET CELLS: 0
HOWELL JOLLY-DIF,2093: 0
Hematocrit: 42.1 % (ref 37.0–50.0)
Hemoglobin: 13.3 gm/dl (ref 13.0–17.2)
Hypochromasia: 0
Lymphocytes %: 3 % — ABNORMAL LOW (ref 28–48)
MACROCYTES, MACROCYT: 0
MCH: 26.8 pg (ref 25.4–34.6)
MCHC: 31.6 gm/dl (ref 30.0–36.0)
MCV: 84.9 fL (ref 80.0–98.0)
MICROCYTES: 0
MPV: 12 fL — ABNORMAL HIGH (ref 6.0–10.0)
Monocytes %: 0 % — ABNORMAL LOW (ref 1–13)
Neutrophils %: 97 % — ABNORMAL HIGH (ref 34–64)
Nucleated RBCs: 0 (ref 0–0)
OVALOCYTES-DIF: 0
PAPPENHEIMER BODIES, PAPPEN: 0
POLYCHROMASIA: 0
Platelet Comment: NORMAL
Platelets: 222 10*3/uL (ref 140–450)
Poikilocytosis: 0
RBC: 4.96 M/uL (ref 3.60–5.20)
RDW-SD: 44.7 (ref 36.4–46.3)
ROULEAUX, 12018: 0
SCHISTOCYTES: 0
SMUDGE CELLS: 1
SPHEROCYTES, 1122: 0
STOMATOCYTES: 0
WBC: 12.1 10*3/uL — ABNORMAL HIGH (ref 4.0–11.0)

## 2020-05-27 LAB — BASIC METABOLIC PANEL
Anion Gap: 9 mmol/L (ref 5–15)
BUN: 19 mg/dl (ref 7–25)
CO2: 29 mEq/L (ref 21–32)
Calcium: 10.3 mg/dl — ABNORMAL HIGH (ref 8.5–10.1)
Chloride: 95 mEq/L — ABNORMAL LOW (ref 98–107)
Creatinine: 1.1 mg/dl (ref 0.6–1.3)
EGFR IF NonAfrican American: 53
GFR African American: >60
Glucose: 388 mg/dl — ABNORMAL HIGH (ref 74–106)
Potassium: 3.8 mEq/L (ref 3.5–5.1)
Sodium: 133 mEq/L — ABNORMAL LOW (ref 136–145)

## 2020-05-27 LAB — POCT GLUCOSE
POC Glucose: 485 mg/dL (ref 65–105)
POC Glucose: 477 mg/dL (ref 65–105)
POC Glucose: 453 mg/dL (ref 65–105)
POC Glucose: 435 mg/dL (ref 65–105)
POC Glucose: 419 mg/dL (ref 65–105)

## 2020-05-27 LAB — POC BLOOD GAS + LACTIC ACID
BASE EXCESS: 11 mmol/L — ABNORMAL HIGH (ref <=3)
BICARBONATE: 34.1 mmol/L — CR (ref 18.0–26.0)
Base Excess: 11 mmol/L — ABNORMAL HIGH (ref <=3)
CO2 Total: 35 mmol/L — ABNORMAL HIGH (ref 24–29)
CO2, TOTAL: 35 mmol/L — ABNORMAL HIGH (ref 24–29)
HCO3: 34.1 mmol/L (ref 18.0–26.0)
LACTIC ACID: 1.56 mmol/L (ref 0.40–2.00)
LITERS PER MINUTE, LPM: 3
Lactic Acid: 1.56 mmol/L (ref 0.40–2.00)
Liters/min.: 3
O2 SAT: 94 % (ref 90–100)
O2 Sat: 94 % (ref 90–100)
PCO2: 46.3 mm Hg — ABNORMAL HIGH (ref 35.0–45.0)
PCO2: 46.3 mm Hg — ABNORMAL HIGH (ref 35.0–45.0)
PO2: 65 mm Hg — ABNORMAL LOW (ref 75–100)
PO2: 65 mm Hg — ABNORMAL LOW (ref 75–100)
Patient temp.: 98.6
Patient temperature: 98.6
pH: 7.475 — ABNORMAL HIGH (ref 7.350–7.450)
pH: 7.475 — ABNORMAL HIGH (ref 7.350–7.450)

## 2020-05-27 LAB — PROTIME-INR
INR: 0.9 (ref 0.1–1.1)
Protime: 11 seconds (ref 10.2–12.9)

## 2020-05-27 LAB — TROPONIN, HIGH SENSITIVITY
Troponin, High Sensitivity: 6 ng/L (ref 0–59)
Troponin, High Sensitivity: 8 ng/L (ref 0–59)

## 2020-05-27 LAB — GLUCOSE, POC
Glucose (POC): 485 mg/dL — CR (ref 65–105)
Glucose (POC): 477 mg/dL — CR (ref 65–105)
Glucose (POC): 453 mg/dL — CR (ref 65–105)
Glucose (POC): 435 mg/dL — CR (ref 65–105)
Glucose (POC): 419 mg/dL — CR (ref 65–105)

## 2020-05-27 LAB — METABOLIC PANEL, BASIC
Anion gap: 9 mmol/L (ref 5–15)
BUN: 19 mg/dl (ref 7–25)
CO2: 29 mEq/L (ref 21–32)
Calcium: 10.3 mg/dl — ABNORMAL HIGH (ref 8.5–10.1)
Chloride: 95 mEq/L — ABNORMAL LOW (ref 98–107)
Creatinine: 1.1 mg/dl (ref 0.6–1.3)
GFR est AA: >60
GFR est non-AA: 53
Glucose: 388 mg/dl — ABNORMAL HIGH (ref 74–106)
Potassium: 3.8 mEq/L (ref 3.5–5.1)
Sodium: 133 mEq/L — ABNORMAL LOW (ref 136–145)

## 2020-05-27 LAB — CBC WITH AUTOMATED DIFF
Acanthrocytes: 0
Anisocytosis: 0
BASOPHILS: 0 % (ref 0–3)
Basophilic stippling: 0
Codocytes: 0
Crenated RBCs: 0
Dacrocytes (Teardrop cells): 0
Drepanocytes: 0
ECHINOCYTES: 0
EOSINOPHILS: 0 % (ref 0–5)
Elliptocytes: 0
Giant platelets: 1
HCT: 42.1 % (ref 37.0–50.0)
HGB: 13.3 gm/dl (ref 13.0–17.2)
HYPOCHROMASIA: 0
Helmet cells: 0
Howell-Jolly body: 0
LYMPHOCYTES: 3 % — ABNORMAL LOW (ref 28–48)
MCH: 26.8 pg (ref 25.4–34.6)
MCHC: 31.6 gm/dl (ref 30.0–36.0)
MCV: 84.9 fL (ref 80.0–98.0)
MONOCYTES: 0 % — ABNORMAL LOW (ref 1–13)
MPV: 12 fL — ABNORMAL HIGH (ref 6.0–10.0)
Macrocytes: 0
Microcytes: 0
NEUTROPHILS: 97 % — ABNORMAL HIGH (ref 34–64)
NRBC: 0 (ref 0–0)
Ovalocytes-DIF: 0
PAPPENHEIMER BODIES: 0
PLATELET COMMENTS: NORMAL
PLATELET: 222 10*3/uL (ref 140–450)
Poikilocytosis: 0
Polychromasia: 0
RBC: 4.96 M/uL (ref 3.60–5.20)
RDW-SD: 44.7 (ref 36.4–46.3)
Rouleaux: 0
SPHEROCYTES: 0
Schistocytes: 0
Smudge cells: 1
Stomatocytes: 0
WBC: 12.1 10*3/uL — ABNORMAL HIGH (ref 4.0–11.0)

## 2020-05-27 LAB — TROPONIN-HIGH SENSITIVITY
Troponin-High Sensitivity: 6 ng/L (ref 0–59)
Troponin-High Sensitivity: 8 ng/L (ref 0–59)

## 2020-05-27 LAB — PROTHROMBIN TIME + INR
INR: 0.9 (ref 0.1–1.1)
Prothrombin time: 11 seconds (ref 10.2–12.9)

## 2020-05-27 MED ORDER — KETOROLAC TROMETHAMINE 30 MG/ML INJECTION
30 mg/mL (1 mL) | Freq: Once | INTRAMUSCULAR | Status: AC
Start: 2020-05-27 — End: 2020-05-27
  Administered 2020-05-27: 11:00:00 via INTRAVENOUS

## 2020-05-27 MED ORDER — IPRATROPIUM-ALBUTEROL 2.5 MG-0.5 MG/3 ML NEB SOLUTION
2.5 mg-0.5 mg/3 ml | Freq: Four times a day (QID) | RESPIRATORY_TRACT | Status: DC
Start: 2020-05-27 — End: 2020-05-28
  Administered 2020-05-28: 01:00:00 via RESPIRATORY_TRACT

## 2020-05-27 MED ORDER — BUTALBITAL-ACETAMINOPHEN-CAFFEINE 50 MG-325 MG-40 MG TAB
50-325-40 mg | Freq: Four times a day (QID) | ORAL | Status: DC | PRN
Start: 2020-05-27 — End: 2020-06-02
  Administered 2020-05-27 – 2020-05-29 (×4): via ORAL

## 2020-05-27 MED ORDER — PREDNISONE 20 MG TAB
20 mg | Freq: Every day | ORAL | Status: DC
Start: 2020-05-27 — End: 2020-05-29
  Administered 2020-05-27 – 2020-05-29 (×3): via ORAL

## 2020-05-27 MED ORDER — MELATONIN 3 MG TAB
3 mg | Freq: Every evening | ORAL | Status: DC | PRN
Start: 2020-05-27 — End: 2020-06-02
  Administered 2020-05-29: 02:00:00 via ORAL

## 2020-05-27 MED ORDER — QUETIAPINE 25 MG TAB
25 mg | Freq: Once | ORAL | Status: AC
Start: 2020-05-27 — End: 2020-05-26
  Administered 2020-05-27: 05:00:00 via ORAL

## 2020-05-27 MED FILL — DILTIAZEM ER 240 MG 24 HR CAP: 240 mg | ORAL | Qty: 1

## 2020-05-27 MED FILL — MONTELUKAST 10 MG TAB: 10 mg | ORAL | Qty: 1

## 2020-05-27 MED FILL — FUROSEMIDE 10 MG/ML IJ SOLN: 10 mg/mL | INTRAMUSCULAR | Qty: 2

## 2020-05-27 MED FILL — IPRATROPIUM-ALBUTEROL 2.5 MG-0.5 MG/3 ML NEB SOLUTION: 2.5 mg-0.5 mg/3 ml | RESPIRATORY_TRACT | Qty: 3

## 2020-05-27 MED FILL — TYLENOL 325 MG TABLET: 325 mg | ORAL | Qty: 2

## 2020-05-27 MED FILL — ALLOPURINOL 100 MG TAB: 100 mg | ORAL | Qty: 1

## 2020-05-27 MED FILL — KETOROLAC TROMETHAMINE 30 MG/ML INJECTION: 30 mg/mL (1 mL) | INTRAMUSCULAR | Qty: 1

## 2020-05-27 MED FILL — BREO ELLIPTA 100 MCG-25 MCG/DOSE POWDER FOR INHALATION: 100-25 mcg/dose | RESPIRATORY_TRACT | Qty: 28

## 2020-05-27 MED FILL — PREDNISONE 20 MG TAB: 20 mg | ORAL | Qty: 2

## 2020-05-27 MED FILL — HEPARIN (PORCINE) 5,000 UNIT/ML IJ SOLN: 5000 unit/mL | INTRAMUSCULAR | Qty: 1

## 2020-05-27 MED FILL — LOSARTAN 50 MG TAB: 50 mg | ORAL | Qty: 1

## 2020-05-27 MED FILL — BUTALBITAL-ACETAMINOPHEN-CAFFEINE 50 MG-325 MG-40 MG TAB: 50-325-40 mg | ORAL | Qty: 1

## 2020-05-27 MED FILL — SOLU-MEDROL (PF) 125 MG/2 ML SOLUTION FOR INJECTION: 125 mg/2 mL | INTRAMUSCULAR | Qty: 2

## 2020-05-27 MED FILL — QUETIAPINE 25 MG TAB: 25 mg | ORAL | Qty: 2

## 2020-05-27 MED FILL — AMLODIPINE 5 MG TAB: 5 mg | ORAL | Qty: 1

## 2020-05-27 MED FILL — ADMELOG U-100 INSULIN LISPRO 100 UNIT/ML SUBCUTANEOUS SOLUTION: 100 unit/mL | SUBCUTANEOUS | Qty: 1

## 2020-05-27 MED FILL — TIOTROPIUM BROMIDE 2.5 MCG/ACTUATION MIST FOR INHALATION: 2.5 mcg/actuation | RESPIRATORY_TRACT | Qty: 4

## 2020-05-27 MED FILL — ROSUVASTATIN 5 MG TAB: 5 mg | ORAL | Qty: 1

## 2020-05-27 NOTE — Progress Notes (Signed)
Problem: Gas Exchange - Impaired  Goal: *Absence of hypoxia  Outcome: Progressing Towards Goal     Problem: Patient Education: Go to Patient Education Activity  Goal: Patient/Family Education  Outcome: Progressing Towards Goal

## 2020-05-27 NOTE — Progress Notes (Signed)
Progress  Notes by Linward Natal, MD at 05/27/20 1142                Author: Linward Natal, MD  Service: Hospitalist  Author Type: Physician       Filed: 05/27/20 1148  Date of Service: 05/27/20 1142  Status: Signed          Editor: Linward Natal, MD (Physician)                       INTERNAL MEDICINE PROGRESS NOTE      Date of note:      May 27, 2020   Patient:               [name] [name] , 69 y.o., female   Admit Date:        05/26/2020   Length of Stay:  1 day(s)   Code Status:      Full Code         Overnight/24-hour Events:      Reviewed.     Subjective:       Patient continues report shortness of breath, nausea, vomiting, headaches, lightheadedness, lower extremity edema.  She was pretty much pan positive on ROS.  No diarrhea however     Assessment:                1.  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  On 2 L nasal cannula   2.  COPD exacerbation   3.  Acute on chronic diastolic congestive heart failure   4.  Hypertension   5.  Dyslipidemia    6.  1.7 cm right lower lobe lung nodule   7.  Headache   8.  Chronic pain syndrome        Plan:                ??  Continue oral steroids as directed by pulmonary   ??  On nebulizer treatments as well   ??  Continue IV diuresis.  Monitor renal function and strict I's and O's   ??  Patient complaining of headache and so ordered Fioricet.   ??  Stable for discharge to telemetry floor        DVT prophylaxis: Heparin      Total clinical care time is 32 minutes, including reviewing today's available labs, radiology, past medical history, as well as discussion with patient and family. Greater than 50% of my time was spent in coordination of care and counseling          Disposition:                 Transfer to telemetry. Possible discharge in 1-2 days     Objective:     BP (!) 174/107    Pulse 94    Temp 98.3 ??F (36.8 ??C)    Resp 20    Ht 5\' 7"  (1.702 m)    Wt 96.2 kg (212 lb 1.3 oz)    SpO2 97%    BMI 33.22 kg/m??          General  No acute  distress.  Alert and oriented x3     Head/face  Normocephalic.  Atraumatic     Neck  Supple.  No evidence of jugular venous distention     Eyes  No scleral icterus noted.  No conjunctival pallor observed     Cardiac  Regular rate and rhythm.  S1 and  S2 heard.  No murmurs noted     Chest  Symmetrical chest rise.  No tenderness to palpation     Respiratory  Clear to auscultation bilaterally.  No wheezing or crackles noted     Abdomen  Soft.  Nontender and nondistended.  Bowel sounds present     Extremities  No gross lower or upper extremity edema noted.  No obvious erythema noted     Skin  No rashes or lesions noted on face, arms, or legs     Back  No focal back tenderness on palpation.  No gross bony deformities noted        Neurological  No focal neurological deficits noted. Cranial nerves grossly intact           Current Medications:        Current Facility-Administered Medications:    ?  predniSONE (DELTASONE) tablet 40 mg, 40 mg, Oral, DAILY WITH BREAKFAST, Zoe Lan, MD, 40 mg at 05/27/20 1001   ?  butalbital-acetaminophen-caffeine (FIORICET, ESGIC) 50-325-40 mg per tablet 1 Tablet, 1 Tablet, Oral, Q6H PRN, Linward Natal, MD   ?  melatonin tablet 6 mg, 6 mg, Oral, QHS PRN, Linward Natal, MD   ?  albuterol (ACCUNEB) nebulizer solution 2.5 mg, 2.5 mg, Nebulization, Q4H PRN, Roderic Scarce, MD   ?  allopurinoL (ZYLOPRIM) tablet 100 mg, 100 mg, Oral, BID, Roderic Scarce, MD, 100 mg at 05/27/20 0948   ?  amLODIPine (NORVASC) tablet 5 mg, 5 mg, Oral, DAILY, Roderic Scarce, MD, 5 mg at 05/27/20 0948   ?  dilTIAZem ER (CARDIZEM CD) capsule 240 mg, 240 mg, Oral, DAILY, Roderic Scarce, MD, 240 mg at 05/26/20 1815   ?  fluticasone-vilanterol (BREO ELLIPTA) 118mcg-25mcg/puff, 1 Puff, Inhalation, DAILY, Roderic Scarce, MD   ?  [facility] by provider] gabapentin (NEURONTIN) capsule 300 mg, 300 mg, Oral, TID, Roderic Scarce, MD   ?  montelukast (SINGULAIR) tablet 10 mg, 10 mg, Oral, DAILY, Roderic Scarce, MD, 10 mg at 05/27/20  0948   ?  losartan (COZAAR) tablet 50 mg, 50 mg, Oral, DAILY, Roderic Scarce, MD, 50 mg at 05/27/20 0948   ?  rosuvastatin (CRESTOR) tablet 5 mg, 5 mg, Oral, QHS, Roderic Scarce, MD, 5 mg at 05/26/20 2025   ?  tiotropium bromide (SPIRIVA RESPIMAT) 2.5 mcg /actuation, 1 [place], Inhalation, DAILY, Roderic Scarce, MD   ?  naloxone [facility]) injection 0.1 mg, 0.1 mg, IntraVENous, PRN, Roderic Scarce, MD   ?  acetaminophen (TYLENOL) tablet 650 mg, 650 mg, Oral, Q4H PRN, 650 mg at 05/27/20 0501 **OR** acetaminophen (TYLENOL) solution 650 mg, 650 mg, Oral, Q4H PRN **OR** acetaminophen (TYLENOL) suppository 650 mg, 650 mg, Rectal, Q4H PRN, Roderic Scarce,  MD   ?  ondansetron [facility]) injection 4 mg, 4 mg, IntraVENous, Q4H PRN, Roderic Scarce, MD   ?  PHOSPHATE ELECTROLYTE REPLACEMENT PROTOCOL STANDARD DOSING, 1 Each, Other, PRN, Roderic Scarce, MD   ?  MAGNESIUM ELECTROLYTE REPLACEMENT PROTOCOL STANDARD DOSING, 1 Each, Other, PRN, Roderic Scarce, MD   ?  POTASSIUM ELECTROLYTE REPLACEMENT PROTOCOL STANDARD DOSING, 1 Each, Other, PRN, Roderic Scarce, MD   ?  CALCIUM ELECTROLYTE REPLACEMENT PROTOCOL STANDARD DOSING, 1 Each, Other, PRN, Roderic Scarce, MD   ?  heparin (porcine) injection 5,000 Units, 5,000 Units, SubCUTAneous, Q8H, Roderic Scarce, MD, 5,000 Units at 05/27/20 0501   ?  dextrose (D50) infusion 5-25 g, 10-50 mL, IntraVENous, PRN, Roderic Scarce, MD   ?  glucagon (GLUCAGEN) injection 1 mg, 1 mg, IntraMUSCular,  PRN, Roderic ScarceVerma, Sourabh, MD   ?  insulin glargine (LANTUS) injection 1-100 Units, 1-100 Units, SubCUTAneous, QHS, Roderic ScarceVerma, Sourabh, MD, 13 Units at 05/26/20 2135   ?  insulin lispro (HUMALOG) injection 1-100 Units, 1-100 Units, SubCUTAneous, AC&HS, Roderic ScarceVerma, Sourabh, MD, 8 Units at 05/27/20 1003   ?  insulin lispro (HUMALOG) injection 1-100 Units, 1-100 Units, SubCUTAneous, PRN, Roderic ScarceVerma, Sourabh, MD, 5 Units at 05/26/20 2153   ?  albuterol-ipratropium (DUO-NEB) 2.5 MG-0.5 MG/3 ML, 3 mL, Nebulization, Q4H RT, Zoe LanAkkina,  Naveen C, MD, 3 mL at 05/27/20 0910   ?  furosemide (LASIX) injection 20 mg, 20 mg, IntraVENous, Q12H, Zoe LanAkkina, Naveen C, MD, 20 mg at 05/27/20 [id number]     Labs:          Recent Results (from the past 24 hour(s))     EKG, 12 LEAD, INITIAL          Collection Time: 05/26/20  1:53 PM         Result  Value  Ref Range            Ventricular Rate  100  BPM       Atrial Rate  100  BPM       P-R Interval  162  ms       QRS Duration  68  ms       Q-T Interval  352  ms       QTC Calculation (Bezet)  454  ms       Calculated P Axis  76  degrees       Calculated R Axis  30  degrees       Calculated T Axis  60  degrees       Diagnosis                 Normal sinus rhythm   Poor R wave progression consistent with possible anterior wall MI of    indeterminate age vs. lead placement variation   Abnormal ECG   When compared with ECG of 26-May-2020 11:06,   Sinus rhythm has replaced Atrial fibrillation   Confirmed by Valentina LucksGriffin, M.D., John ([id number]) on 05/26/2020 3:41:58 PM          POC BLOOD GAS + LACTIC ACID          Collection Time: 05/26/20  2:33 PM         Result  Value  Ref Range            pH  7.330 (L)  7.350 - 7.450         PCO2  65.6 (HH)  35.0 - 45.0 mm Hg       PO2  114.0 (H)  75 - 100 mm Hg       BICARBONATE  34.5 (HH)  18.0 - 26.0 mmol/L       O2 SAT  98.0  90 - 100 %       CO2, TOTAL  36.0 (H)  24 - 29 mmol/L       Lactic Acid  0.59  0.40 - 2.00 mmol/L       BASE EXCESS  9 (H)  -2 - 3 mmol/L       Patient temp.  37.2 C          Sample type  Art          FIO2  40          SITE  R Radial  DEVICE  BiPAP          ALLENS TEST  Pass          IPAP/PIP  15          EPAP/CPAP/PEEP  5          Respiratory Rate  18          SET RATE  10          VTEXP  429          GLUCOSE, POC          Collection Time: 05/26/20  6:13 PM         Result  Value  Ref Range            Glucose (POC)  370 (H)  65 - 105 mg/dL       GLUCOSE, POC          Collection Time: 05/26/20  8:17 PM         Result  Value  Ref Range            Glucose (POC)  485 (HH)   65 - 105 mg/dL       PROTHROMBIN TIME + INR          Collection Time: 05/26/20  8:18 PM         Result  Value  Ref Range            Prothrombin time  11.0  10.2 - 12.9 seconds       INR  0.9  0.1 - 1.1         TROPONIN-HIGH SENSITIVITY          Collection Time: 05/26/20  8:18 PM         Result  Value  Ref Range            Troponin-High Sensitivity  6  0 - 59 ng/L       GLUCOSE, POC          Collection Time: 05/26/20  9:11 PM         Result  Value  Ref Range            Glucose (POC)  477 (HH)  65 - 105 mg/dL       POC BLOOD GAS + LACTIC ACID          Collection Time: 05/26/20 10:46 PM         Result  Value  Ref Range            pH  7.475 (H)  7.350 - 7.450         PCO2  46.3 (H)  35.0 - 45.0 mm Hg       PO2  65.0 (L)  75 - 100 mm Hg       BICARBONATE  34.1 (HH)  18.0 - 26.0 mmol/L       O2 SAT  94.0  90 - 100 %       CO2, TOTAL  35.0 (H)  24 - 29 mmol/L       Lactic Acid  1.56  0.40 - 2.00 mmol/L       BASE EXCESS  11 (H)  -2 - 3 mmol/L       Patient temp.  98.6 F          Sample type  Art          SITE  R Radial  DEVICE  Nasal Can          ALLENS TEST  Pass          Liters/min.  3          TROPONIN-HIGH SENSITIVITY          Collection Time: 05/27/20 12:19 AM         Result  Value  Ref Range            Troponin-High Sensitivity  8  0 - 59 ng/L       CBC WITH AUTOMATED DIFF          Collection Time: 05/27/20 12:19 AM         Result  Value  Ref Range            WBC  12.1 (H)  4.0 - 11.0 1000/mm3       NEUTROPHILS  97.0 (H)  34 - 64 %       LYMPHOCYTES  3.0 (L)  28 - 48 %       RBC  4.96  3.60 - 5.20 M/uL       HGB  13.3  13.0 - 17.2 gm/dl       HCT  88.4  16.6 - 50.0 %       MCV  84.9  80.0 - 98.0 fL       MCH  26.8  25.4 - 34.6 pg       MCHC  31.6  30.0 - 36.0 gm/dl       PLATELET  063  016 - 450 1000/mm3       MPV  12.0 (H)  6.0 - 10.0 fL       RDW-SD  44.7  36.4 - 46.3         Poikilocytosis  0          NRBC  0  0 - 0         Anisocytosis  0          HYPOCHROMASIA  0          Polychromasia  0           Microcytes  0          Macrocytes  0          Codocytes  0          Schistocytes  0          Dacrocytes (Teardrop cells)  0          Elliptocytes  0          Basophilic stippling  0          MONOCYTES  0.0 (L)  1 - 13 %       EOSINOPHILS  0.0  0 - 5 %       SPHEROCYTES  0          BASOPHILS  0.0  0 - 3 %       Smudge cells  1.0          Rouleaux  0          Stomatocytes  0          Giant platelets  1.0          Acanthrocytes  0          ECHINOCYTES  0          Crenated RBCs  0  PAPPENHEIMER BODIES  0          Ovalocytes-DIF  0          Drepanocytes  0          Helmet cells  0          PLATELET COMMENTS  NORMAL          Howell-Jolly body  0          METABOLIC PANEL, BASIC          Collection Time: 05/27/20 12:19 AM         Result  Value  Ref Range            Sodium  133 (L)  136 - 145 mEq/L       Potassium  3.8  3.5 - 5.1 mEq/L       Chloride  95 (L)  98 - 107 mEq/L       CO2  29  21 - 32 mEq/L       Glucose  388 (H)  74 - 106 mg/dl       BUN  19  7 - 25 mg/dl       Creatinine  1.1  0.6 - 1.3 mg/dl       GFR est AA  >16.1          GFR est non-AA  53          Calcium  10.3 (H)  8.5 - 10.1 mg/dl       Anion gap  9  5 - 15 mmol/L       GLUCOSE, POC          Collection Time: 05/27/20  8:43 AM         Result  Value  Ref Range            Glucose (POC)  453 (HH)  65 - 105 mg/dL          Radiology     No results found.       Portions of this electronic record were dictated using Conservation officer, historic buildings. Unintended errors in translation may occur.         Linward Natal, MD   [facility]   May 27, 2020

## 2020-05-27 NOTE — Progress Notes (Signed)
Patient was placed on BIPAP 16/5, rate 10, and FIO2 35%. Patient then requested to be taken off BIPAP, and has refused to wear. Patient on 2L nc, saturations 96%.

## 2020-05-27 NOTE — Progress Notes (Signed)
Problem: Breathing Pattern - Ineffective  Goal: *Absence of hypoxia  Outcome: Progressing Towards Goal

## 2020-05-27 NOTE — Progress Notes (Signed)
Progress Notes by Zoe Lan, MD at 05/27/20 [phone]                Author: Zoe Lan, MD  Service: Pulmonary Disease  Author Type: Physician       Filed: 05/27/20 0845  Date of Service: 05/27/20 0658  Status: Signed          Editor: Zoe Lan, MD (Physician)                              [facility] PULMONARY AND CRITICAL CARE MEDICINE           Progress Note          Name:  [name] [name]        DOB:  Feb 17, 1951     MRN:  [id number]        Date:  05/27/2020      [x]  I have reviewed the flowsheet and previous days notes. Events, vitals, medications and notes from last 24 hours reviewed.         ASSESSMENT/PLAN:     -Acute on chronic hypoxic and hypercapnic respiratory failure.  Requiring BiPAP.    Off BiPAP this morning.  Doing well on 3 L nasal cannula saturating  100%-decreased to 2 L.  Change BiPAP to as needed.  Titrate effort to keep sats more than 88%.   ??   -COPD/asthma exacerbation.  Change IV Solu-Medrol to prednisone taper.  Continue neb treatments.   ??   -Allergic rhinitis.  Continue cetirizine, Flonase and Singulair.   ??   -COPD.  On Advair and Spiriva Respimat at home.  Sees Dr. as outpatient.   ??   -Acute on chronic diastolic CHF with lower extremity and upper extremity edema..  Echo 05/2019-EF 63%, grade 1 diastolic dysfunction.  On home Lasix.  proBNP 644.    Continue Lasix 20 mg IV every 12.   ??   -Hypertension.  On Cardizem, losartan and Norvasc.   ??   -Hyperlipidemia   ??   -1.7 cm right lower lobe nodule.  Stable on CT from May 2018 to March 2020.  Seen on x-ray 05/27/2019.  Follow-up with Dr. 05/29/2019 as outpatient.   ??   -Continue heparin for DVT prophylaxis.   ??   -Discussed with patient, RN      Transfer to telemetry.                   SUBJECTIVE:   Breathing better.  Of BiPAP this morning.  Leg edema better.   ROS:    Otherwise negative except for as mentioned in subjective.       Allergy:     Allergies        Allergen  Reactions         ?  Chocolate [Cocoa]  Sneezing              Confirms allergy but reports "I still eat it"            Vital Signs:       Visit Vitals   BP  (!) 154/109      Pulse  97      Temp  97.7 ??F (36.5 ??C)      Resp  (!) 38      Ht  5\' 7"  (1.702 m)      Wt  96.2 kg (212 lb 1.3 oz)  SpO2  99%      BMI  33.22 kg/m??                O2 Device: Nasal cannula     O2 Flow Rate (L/min): 2 l/min       Temp (24hrs), Avg:98.3 ??F (36.8 ??C), Min:97.7 ??F (36.5 ??C), Max:98.9 ??F (37.2 ??C)           Patient Vitals for the past 8 hrs:         Temp  SpO2         05/27/20 0505  97.7 ??F (36.5 ??C)  --         05/27/20 0411  --  99 %     05/27/20 0319  98.1 ??F (36.7 ??C)  --     05/27/20 0130  97.9 ??F (36.6 ??C)  --     05/27/20 0002  --  97 %     05/26/20 2356  --  94 %         05/26/20 2332  98.5 ??F (36.9 ??C)  --              Intake/Output:       Last shift:      12/14 1901 - 12/15 0700   In: -    Out: 1100 [Urine:1100]   Last 3 shifts: No intake/output data recorded.      Intake/Output Summary (Last 24 hours) at 05/27/2020 0658   Last data filed at 05/26/2020 2332     Gross per 24 hour        Intake  --        Output  1100 ml        Net  -1100 ml               Physical Exam:   GENERAL: AAO x4 .    NECK: Supple. Trachea midline.    RESPIRATORY: Bilateral BS present, decreased at bases. No rales or rhonchi.    CARDIOVASCULAR: S1 and S2 present, regular. No murmur, rub, or thrill.    ABDOMEN: Soft and nontender with positive bowel sounds. No organomegaly.    NEUROLOGICAL: No focal deficits   EXT: No edema or cyanosis.          DATA:         Current Facility-Administered Medications          Medication  Dose  Route  Frequency           ?  albuterol (ACCUNEB) nebulizer solution 2.5 mg   2.5 mg  Nebulization  Q4H PRN     ?  allopurinoL (ZYLOPRIM) tablet 100 mg   100 mg  Oral  BID     ?  amLODIPine (NORVASC) tablet 5 mg   5 mg  Oral  DAILY     ?  dilTIAZem ER (CARDIZEM CD) capsule 240 mg   240 mg  Oral  DAILY     ?  fluticasone-vilanterol (BREO ELLIPTA) 180mcg-25mcg/puff   1 Puff  Inhalation   DAILY     ?  [Held by provider] gabapentin (NEURONTIN) capsule 300 mg   300 mg  Oral  TID     ?  montelukast (SINGULAIR) tablet 10 mg   10 mg  Oral  DAILY     ?  losartan (COZAAR) tablet 50 mg   50 mg  Oral  DAILY     ?  rosuvastatin (CRESTOR) tablet 5 mg   5 mg  Oral  QHS     ?  tiotropium bromide (SPIRIVA RESPIMAT) 2.5 mcg /actuation   1 Puff  Inhalation  DAILY     ?  naloxone (NARCAN) injection 0.1 mg   0.1 mg  IntraVENous  PRN     ?  acetaminophen (TYLENOL) tablet 650 mg   650 mg  Oral  Q4H PRN          Or           ?  acetaminophen (TYLENOL) solution 650 mg   650 mg  Oral  Q4H PRN          Or           ?  acetaminophen (TYLENOL) suppository 650 mg   650 mg  Rectal  Q4H PRN           ?  ondansetron (ZOFRAN) injection 4 mg   4 mg  IntraVENous  Q4H PRN           ?  PHOSPHATE ELECTROLYTE REPLACEMENT PROTOCOL STANDARD DOSING   1 Each  Other  PRN     ?  MAGNESIUM ELECTROLYTE REPLACEMENT PROTOCOL STANDARD DOSING   1 Each  Other  PRN     ?  POTASSIUM ELECTROLYTE REPLACEMENT PROTOCOL STANDARD DOSING   1 Each  Other  PRN     ?  CALCIUM ELECTROLYTE REPLACEMENT PROTOCOL STANDARD DOSING   1 Each  Other  PRN     ?  heparin (porcine) injection 5,000 Units   5,000 Units  SubCUTAneous  Q8H     ?  methylPREDNISolone (PF) (Solu-MEDROL) injection 40 mg   40 mg  IntraVENous  Q8H     ?  dextrose (D50) infusion 5-25 g   10-50 mL  IntraVENous  PRN     ?  glucagon (GLUCAGEN) injection 1 mg   1 mg  IntraMUSCular  PRN     ?  insulin glargine (LANTUS) injection 1-100 Units   1-100 Units  SubCUTAneous  QHS     ?  insulin lispro (HUMALOG) injection 1-100 Units   1-100 Units  SubCUTAneous  AC&HS     ?  insulin lispro (HUMALOG) injection 1-100 Units   1-100 Units  SubCUTAneous  PRN     ?  albuterol-ipratropium (DUO-NEB) 2.5 MG-0.5 MG/3 ML   3 mL  Nebulization  Q4H RT           ?  furosemide (LASIX) injection 20 mg   20 mg  IntraVENous  Q12H                    Labs:   Reviewed.      Recent Results (from the past 24 hour(s))     EKG, 12 LEAD,  INITIAL          Collection Time: 05/26/20 11:06 AM         Result  Value  Ref Range            Ventricular Rate  103  BPM       Atrial Rate  288  BPM       QRS Duration  68  ms       Q-T Interval  332  ms       QTC Calculation (Bezet)  434  ms       Calculated R Axis  32  degrees       Calculated T Axis  52  degrees  Diagnosis                 Atrial fibrillation with rapid ventricular response   Abnormal ECG   When compared with ECG of 22-Aug-2019 13:40,   Atrial fibrillation has replaced Sinus rhythm   Nonspecific T wave abnormality no longer evident in Inferior leads   Nonspecific T wave abnormality no longer evident in Anterolateral leads   Confirmed by Valentina LucksGriffin, M.D., John ([id number]) on 05/26/2020 12:52:19 PM          NT-PRO BNP          Collection Time: 05/26/20 11:20 AM         Result  Value  Ref Range            NT pro-BNP  9.0  0.0 - 125.0 pg/ml       CBC WITH AUTOMATED DIFF          Collection Time: 05/26/20 11:20 AM         Result  Value  Ref Range            WBC  14.7 (H)  4.0 - 11.0 1000/mm3       RBC  4.97  3.60 - 5.20 M/uL       HGB  13.5  13.0 - 17.2 gm/dl       HCT  16.142.6  09.637.0 - 50.0 %       MCV  85.7  80.0 - 98.0 fL       MCH  27.2  25.4 - 34.6 pg       MCHC  31.7  30.0 - 36.0 gm/dl       PLATELET  045238  409140 - 450 1000/mm3       MPV  12.5 (H)  6.0 - 10.0 fL       RDW-SD  45.3  36.4 - 46.3         NRBC  0  0 - 0         IMMATURE GRANULOCYTES  0.6  0.0 - 3.0 %       NEUTROPHILS  82.2 (H)  34 - 64 %       LYMPHOCYTES  10.4 (L)  28 - 48 %       MONOCYTES  5.6  1 - 13 %       EOSINOPHILS  1.0  0 - 5 %       BASOPHILS  0.2  0 - 3 %       METABOLIC PANEL, BASIC          Collection Time: 05/26/20 11:20 AM         Result  Value  Ref Range            Sodium  133 (L)  136 - 145 mEq/L       Potassium  3.8  3.5 - 5.1 mEq/L       Chloride  97 (L)  98 - 107 mEq/L       CO2  35 (H)  21 - 32 mEq/L       Glucose  374 (H)  74 - 106 mg/dl       BUN  12  7 - 25 mg/dl       Creatinine  1.0  0.6 - 1.3 mg/dl       GFR est AA   >81.1>60.0          GFR est non-AA  59  Calcium  9.8  8.5 - 10.1 mg/dl       Anion gap  1 (L)  5 - 15 mmol/L       TROPONIN-HIGH SENSITIVITY          Collection Time: 05/26/20 11:20 AM         Result  Value  Ref Range            Troponin-High Sensitivity  5  0 - 59 ng/L       EKG, 12 LEAD, INITIAL          Collection Time: 05/26/20  1:53 PM         Result  Value  Ref Range            Ventricular Rate  100  BPM       Atrial Rate  100  BPM       P-R Interval  162  ms       QRS Duration  68  ms       Q-T Interval  352  ms       QTC Calculation (Bezet)  454  ms       Calculated P Axis  76  degrees       Calculated R Axis  30  degrees       Calculated T Axis  60  degrees       Diagnosis                 Normal sinus rhythm   Poor R wave progression consistent with possible anterior wall MI of    indeterminate age vs. lead placement variation   Abnormal ECG   When compared with ECG of 26-May-2020 11:06,   Sinus rhythm has replaced Atrial fibrillation   Confirmed by Valentina Lucks, M.D., John ([id number]) on 05/26/2020 3:41:58 PM          POC BLOOD GAS + LACTIC ACID          Collection Time: 05/26/20  2:33 PM         Result  Value  Ref Range            pH  7.330 (L)  7.350 - 7.450         PCO2  65.6 (HH)  35.0 - 45.0 mm Hg       PO2  114.0 (H)  75 - 100 mm Hg       BICARBONATE  34.5 (HH)  18.0 - 26.0 mmol/L       O2 SAT  98.0  90 - 100 %       CO2, TOTAL  36.0 (H)  24 - 29 mmol/L       Lactic Acid  0.59  0.40 - 2.00 mmol/L       BASE EXCESS  9 (H)  -2 - 3 mmol/L       Patient temp.  37.2 C          Sample type  Art          FIO2  40          SITE  R Radial          DEVICE  BiPAP          ALLENS TEST  Pass          IPAP/PIP  15          EPAP/CPAP/PEEP  5          Respiratory Rate  18          SET RATE  10          VTEXP  429          GLUCOSE, POC          Collection Time: 05/26/20  6:13 PM         Result  Value  Ref Range            Glucose (POC)  370 (H)  65 - 105 mg/dL       GLUCOSE, POC          Collection Time: 05/26/20  8:17 PM          Result  Value  Ref Range            Glucose (POC)  485 (HH)  65 - 105 mg/dL       PROTHROMBIN TIME + INR          Collection Time: 05/26/20  8:18 PM         Result  Value  Ref Range            Prothrombin time  11.0  10.2 - 12.9 seconds       INR  0.9  0.1 - 1.1         TROPONIN-HIGH SENSITIVITY          Collection Time: 05/26/20  8:18 PM         Result  Value  Ref Range            Troponin-High Sensitivity  6  0 - 59 ng/L       GLUCOSE, POC          Collection Time: 05/26/20  9:11 PM         Result  Value  Ref Range            Glucose (POC)  477 (HH)  65 - 105 mg/dL       POC BLOOD GAS + LACTIC ACID          Collection Time: 05/26/20 10:46 PM         Result  Value  Ref Range            pH  7.475 (H)  7.350 - 7.450         PCO2  46.3 (H)  35.0 - 45.0 mm Hg       PO2  65.0 (L)  75 - 100 mm Hg       BICARBONATE  34.1 (HH)  18.0 - 26.0 mmol/L       O2 SAT  94.0  90 - 100 %       CO2, TOTAL  35.0 (H)  24 - 29 mmol/L       Lactic Acid  1.56  0.40 - 2.00 mmol/L       BASE EXCESS  11 (H)  -2 - 3 mmol/L       Patient temp.  98.6 F          Sample type  Art          SITE  R Radial          DEVICE  Nasal Can          ALLENS TEST  Pass          Liters/min.  3          TROPONIN-HIGH SENSITIVITY  Collection Time: 05/27/20 12:19 AM         Result  Value  Ref Range            Troponin-High Sensitivity  8  0 - 59 ng/L       CBC WITH AUTOMATED DIFF          Collection Time: 05/27/20 12:19 AM         Result  Value  Ref Range            WBC  12.1 (H)  4.0 - 11.0 1000/mm3       RBC  4.96  3.60 - 5.20 M/uL       HGB  13.3  13.0 - 17.2 gm/dl       HCT  16.1  09.6 - 50.0 %       MCV  84.9  80.0 - 98.0 fL       MCH  26.8  25.4 - 34.6 pg       MCHC  31.6  30.0 - 36.0 gm/dl       PLATELET  045  409 - 450 1000/mm3       MPV  12.0 (H)  6.0 - 10.0 fL            RDW-SD  44.7  36.4 - 46.3               Imaging:   I have personally reviewed imaging.             Zoe Lan, MD         Dragon medical dictation software was used  for portions of this report. Unintended voice transcription errors may have occurred.

## 2020-05-28 ENCOUNTER — Inpatient Hospital Stay: Payer: MEDICARE | Primary: Physician Assistant

## 2020-05-28 LAB — BASIC METABOLIC PANEL
Anion Gap: -1 mmol/L — ABNORMAL LOW (ref 5–15)
BUN: 24 mg/dl (ref 7–25)
CO2: 32 mEq/L (ref 21–32)
Calcium: 9.8 mg/dl (ref 8.5–10.1)
Chloride: 101 mEq/L (ref 98–107)
Creatinine: 0.9 mg/dl (ref 0.6–1.3)
EGFR IF NonAfrican American: >60
GFR African American: >60
Glucose: 302 mg/dl — ABNORMAL HIGH (ref 74–106)
Potassium: 3.5 mEq/L (ref 3.5–5.1)
Sodium: 132 mEq/L — ABNORMAL LOW (ref 136–145)

## 2020-05-28 LAB — AMYLASE
Amylase, Total: 41 U/L (ref 25–115)
Amylase: 41 U/L (ref 25–115)

## 2020-05-28 LAB — LIPASE
Lipase: 130 U/L (ref 73–393)
Lipase: 130 U/L (ref 73–393)

## 2020-05-28 LAB — HEPATIC FUNCTION PANEL
ALT (SGPT): 16 U/L (ref 12–78)
ALT: 16 U/L (ref 12–78)
AST (SGOT): 7 U/L — ABNORMAL LOW (ref 15–37)
AST: 7 U/L — ABNORMAL LOW (ref 15–37)
Albumin: 3.5 gm/dl (ref 3.4–5.0)
Albumin: 3.5 gm/dl (ref 3.4–5.0)
Alk. phosphatase: 104 U/L (ref 45–117)
Alkaline Phosphatase: 104 U/L (ref 45–117)
Bilirubin, Direct: 0.1 mg/dl (ref 0.0–0.2)
Bilirubin, direct: 0.1 mg/dl (ref 0.0–0.2)
Bilirubin, total: 0.8 mg/dl (ref 0.2–1.0)
Protein, total: 8.3 gm/dl — ABNORMAL HIGH (ref 6.4–8.2)
Total Bilirubin: 0.8 mg/dl (ref 0.2–1.0)
Total Protein: 8.3 gm/dl — ABNORMAL HIGH (ref 6.4–8.2)

## 2020-05-28 LAB — POCT GLUCOSE
POC Glucose: 344 mg/dL — ABNORMAL HIGH (ref 65–105)
POC Glucose: 319 mg/dL — ABNORMAL HIGH (ref 65–105)
POC Glucose: 359 mg/dL — ABNORMAL HIGH (ref 65–105)
POC Glucose: 398 mg/dL — ABNORMAL HIGH (ref 65–105)
POC Glucose: 395 mg/dL — ABNORMAL HIGH (ref 65–105)
POC Glucose: 404 mg/dL (ref 65–105)

## 2020-05-28 LAB — POTASSIUM
Potassium: 4 mEq/L (ref 3.5–5.1)
Potassium: 4 mEq/L (ref 3.5–5.1)

## 2020-05-28 LAB — METABOLIC PANEL, BASIC
Anion gap: -1 mmol/L — ABNORMAL LOW (ref 5–15)
BUN: 24 mg/dl (ref 7–25)
CO2: 32 mEq/L (ref 21–32)
Calcium: 9.8 mg/dl (ref 8.5–10.1)
Chloride: 101 mEq/L (ref 98–107)
Creatinine: 0.9 mg/dl (ref 0.6–1.3)
GFR est AA: >60
GFR est non-AA: >60
Glucose: 302 mg/dl — ABNORMAL HIGH (ref 74–106)
Potassium: 3.5 mEq/L (ref 3.5–5.1)
Sodium: 132 mEq/L — ABNORMAL LOW (ref 136–145)

## 2020-05-28 LAB — GLUCOSE, POC
Glucose (POC): 344 mg/dL — ABNORMAL HIGH (ref 65–105)
Glucose (POC): 319 mg/dL — ABNORMAL HIGH (ref 65–105)
Glucose (POC): 359 mg/dL — ABNORMAL HIGH (ref 65–105)
Glucose (POC): 398 mg/dL — ABNORMAL HIGH (ref 65–105)
Glucose (POC): 395 mg/dL — ABNORMAL HIGH (ref 65–105)
Glucose (POC): 404 mg/dL — CR (ref 65–105)

## 2020-05-28 MED ORDER — POTASSIUM CHLORIDE SR 20 MEQ TAB, PARTICLES/CRYSTALS
20 mEq | Freq: Every day | ORAL | Status: DC
Start: 2020-05-28 — End: 2020-06-02
  Administered 2020-05-29 – 2020-06-02 (×5): via ORAL

## 2020-05-28 MED ORDER — MORPHINE 2 MG/ML INJECTION
2 mg/mL | Freq: Once | INTRAMUSCULAR | Status: AC
Start: 2020-05-28 — End: 2020-05-28
  Administered 2020-05-28: 14:00:00 via INTRAVENOUS

## 2020-05-28 MED ORDER — POTASSIUM CHLORIDE SR 10 MEQ TAB
10 mEq | ORAL | Status: AC
Start: 2020-05-28 — End: 2020-05-28
  Administered 2020-05-28: 07:00:00 via ORAL

## 2020-05-28 MED ORDER — CETIRIZINE 5 MG TAB
5 mg | Freq: Every day | ORAL | Status: DC
Start: 2020-05-28 — End: 2020-06-02
  Administered 2020-05-29 – 2020-06-02 (×5): via ORAL

## 2020-05-28 MED ORDER — IBUPROFEN 600 MG TAB
600 mg | Freq: Once | ORAL | Status: AC
Start: 2020-05-28 — End: 2020-05-27
  Administered 2020-05-28: 02:00:00 via ORAL

## 2020-05-28 MED ORDER — IPRATROPIUM-ALBUTEROL 2.5 MG-0.5 MG/3 ML NEB SOLUTION
2.5 mg-0.5 mg/3 ml | Freq: Two times a day (BID) | RESPIRATORY_TRACT | Status: DC
Start: 2020-05-28 — End: 2020-05-28

## 2020-05-28 MED ORDER — FUROSEMIDE 20 MG TAB
20 mg | Freq: Every day | ORAL | Status: DC
Start: 2020-05-28 — End: 2020-06-02
  Administered 2020-05-29 – 2020-06-02 (×5): via ORAL

## 2020-05-28 MED ORDER — IPRATROPIUM-ALBUTEROL 2.5 MG-0.5 MG/3 ML NEB SOLUTION
2.5 mg-0.5 mg/3 ml | Freq: Three times a day (TID) | RESPIRATORY_TRACT | Status: DC
Start: 2020-05-28 — End: 2020-05-29
  Administered 2020-05-28 – 2020-05-29 (×5): via RESPIRATORY_TRACT

## 2020-05-28 MED ORDER — FLUTICASONE 50 MCG/ACTUATION NASAL SPRAY, SUSP
50 mcg/actuation | Freq: Every day | NASAL | Status: DC | PRN
Start: 2020-05-28 — End: 2020-06-02

## 2020-05-28 MED FILL — HEPARIN (PORCINE) 5,000 UNIT/ML IJ SOLN: 5000 unit/mL | INTRAMUSCULAR | Qty: 1

## 2020-05-28 MED FILL — MORPHINE 2 MG/ML INJECTION: 2 mg/mL | INTRAMUSCULAR | Qty: 1

## 2020-05-28 MED FILL — BUTALBITAL-ACETAMINOPHEN-CAFFEINE 50 MG-325 MG-40 MG TAB: 50-325-40 mg | ORAL | Qty: 1

## 2020-05-28 MED FILL — AMLODIPINE 5 MG TAB: 5 mg | ORAL | Qty: 1

## 2020-05-28 MED FILL — IPRATROPIUM-ALBUTEROL 2.5 MG-0.5 MG/3 ML NEB SOLUTION: 2.5 mg-0.5 mg/3 ml | RESPIRATORY_TRACT | Qty: 3

## 2020-05-28 MED FILL — ROSUVASTATIN 5 MG TAB: 5 mg | ORAL | Qty: 1

## 2020-05-28 MED FILL — POTASSIUM CHLORIDE SR 10 MEQ TAB: 10 mEq | ORAL | Qty: 3

## 2020-05-28 MED FILL — LOSARTAN 50 MG TAB: 50 mg | ORAL | Qty: 1

## 2020-05-28 MED FILL — FUROSEMIDE 10 MG/ML IJ SOLN: 10 mg/mL | INTRAMUSCULAR | Qty: 2

## 2020-05-28 MED FILL — INSULIN GLARGINE 100 UNIT/ML INJECTION: 100 unit/mL | SUBCUTANEOUS | Qty: 1

## 2020-05-28 MED FILL — ALLOPURINOL 100 MG TAB: 100 mg | ORAL | Qty: 1

## 2020-05-28 MED FILL — IBUPROFEN 600 MG TAB: 600 mg | ORAL | Qty: 1

## 2020-05-28 MED FILL — MONTELUKAST 10 MG TAB: 10 mg | ORAL | Qty: 1

## 2020-05-28 MED FILL — PREDNISONE 20 MG TAB: 20 mg | ORAL | Qty: 2

## 2020-05-28 MED FILL — ONDANSETRON (PF) 4 MG/2 ML INJECTION: 4 mg/2 mL | INTRAMUSCULAR | Qty: 2

## 2020-05-28 MED FILL — DILTIAZEM ER 240 MG 24 HR CAP: 240 mg | ORAL | Qty: 1

## 2020-05-28 MED FILL — ACETAMINOPHEN 325 MG/10.15 ML ORAL SOLN: 325 mg/10.15 mL | ORAL | Qty: 20.3

## 2020-05-28 NOTE — Progress Notes (Signed)
Bedside and Verbal shift change report given to Dent Moore, RN (oncoming nurse) by Levy Sjogren, RN  (offgoing nurse). Report included the following information SBAR, MAR and Cardiac Rhythm ST.

## 2020-05-28 NOTE — Progress Notes (Signed)
Progress Notes by Zoe Lan, MD at 05/28/20 [phone]                Author: Zoe Lan, MD  Service: Pulmonary Disease  Author Type: Physician       Filed: 05/28/20 0837  Date of Service: 05/28/20 0717  Status: Addendum          Editor: Zoe Lan, MD (Physician)          Related Notes: Original Note by Zoe Lan, MD (Physician) filed at 05/28/20 [phone]                              [facility] PULMONARY AND CRITICAL CARE MEDICINE           Progress Note          Name:  [name] [name]        DOB:  [birth year]     MRN:  [id number]        Date:  05/28/2020      [x]  I have reviewed the flowsheet and previous days notes. Events, vitals, medications and notes from last 24 hours reviewed.         ASSESSMENT/PLAN:     -Acute on chronic hypoxic and hypercapnic respiratory failure.  Requiring BiPAP.    Off BiPAP since 05/27/2020.  Doing well on 2L nasal cannula.  Can  use BiPAP as needed.  Titrate effort to keep sats more than 88%.   ??   -COPD/asthma exacerbation.  Continue prednisone 40 mg once a day and taper by 10 mg every 3 days until completed.  Continue neb treatments.   ??   -Allergic rhinitis.  Continue cetirizine, Flonase and Singulair.   ??   -COPD.  On Advair and Spiriva Respimat at home.  Sees Dr. 05/29/2020 as outpatient.   ??   -Acute on chronic diastolic CHF with lower extremity and upper extremity edema..  Echo 05/2019-EF 63%, grade 1 diastolic dysfunction.  On home Lasix.  proBNP 644.    Continue Lasix 20 mg IV every 12.      -Nausea/vomiting/abdominal pain.  Abdominal exam benign.  Zofran as needed.  Will check abdominal x-ray, LFTs, amylase and lipase.  Rest of recommendations per IM.   ??   -Hypertension.  On Cardizem, losartan and Norvasc.   ??   -Hyperlipidemia   ??   -1.7 cm right lower lobe nodule.  Stable on CT from May 2018 to March 2020.  Seen on x-ray 05/26/2020.  Follow-up with Dr. 05/28/2020 as outpatient.   ??   -Continue heparin for DVT prophylaxis.   ??   -Discussed with patient, RN                    SUBJECTIVE:   Breathing better.  Leg edema better.  Nausea vomiting abdominal pain this morning.      ROS:    Otherwise negative except for as mentioned in subjective.       Allergy:     Allergies        Allergen  Reactions         ?  Chocolate [Cocoa]  Sneezing             Confirms allergy but reports "I still eat it"            Vital Signs:       Visit Vitals   BP  (!) 167/101  Pulse  (!) 105      Temp  98 ??F (36.7 ??C)      Resp  15      Ht  5\' 7"  (1.702 m)      Wt  96.2 kg (212 lb 1.3 oz)      SpO2  92%      BMI  33.22 kg/m??                O2 Device: Nasal cannula     O2 Flow Rate (L/min): 2 l/min       Temp (24hrs), Avg:98.4 ??F (36.9 ??C), Min:97.2 ??F (36.2 ??C), Max:99.2 ??F (37.3 ??C)           Patient Vitals for the past 8 hrs:            Temp  Pulse  Resp  BP  SpO2            05/28/20 0640  98 ??F (36.7 ??C)  --  --  --  --            05/28/20 0442  98.4 ??F (36.9 ??C)  --  --  --  --     05/28/20 0400  --  (!) 105  15  (!) 167/101  92 %     05/28/20 0200  98.4 ??F (36.9 ??C)  86  17  (!) 160/93  96 %            05/28/20 0000  98.7 ??F (37.1 ??C)  --  --  --  --              Intake/Output:       Last shift:      No intake/output data recorded.   Last 3 shifts: 12/14 1901 - 12/16 0700   In: 240 [P.O.:240]   Out: 3850 [Urine:3850]      Intake/Output Summary (Last 24 hours) at 05/28/2020 0717   Last data filed at 05/28/2020 0442     Gross per 24 hour        Intake  240 ml        Output  2750 ml        Net  -2510 ml               Physical Exam:   GENERAL: AAO x4 .    NECK: Supple. Trachea midline.    RESPIRATORY: Bilateral BS present, decreased at bases. No rales or rhonchi.    CARDIOVASCULAR: S1 and S2 present, regular. No murmur, rub, or thrill.    ABDOMEN: Soft and nontender with positive bowel sounds. No organomegaly.    NEUROLOGICAL: No focal deficits   EXT: No edema or cyanosis.          DATA:         Current Facility-Administered Medications          Medication  Dose  Route  Frequency           ?   albuterol-ipratropium (DUO-NEB) 2.5 MG-0.5 MG/3 ML   3 mL  Nebulization  TID RT     ?  predniSONE (DELTASONE) tablet 40 mg   40 mg  Oral  DAILY WITH BREAKFAST     ?  butalbital-acetaminophen-caffeine (FIORICET, ESGIC) 50-325-40 mg per tablet 1 Tablet   1 Tablet  Oral  Q6H PRN     ?  melatonin tablet 6 mg   6 mg  Oral  QHS PRN     ?  albuterol (ACCUNEB) nebulizer solution 2.5 mg   2.5 mg  Nebulization  Q4H PRN     ?  allopurinoL (ZYLOPRIM) tablet 100 mg   100 mg  Oral  BID     ?  amLODIPine (NORVASC) tablet 5 mg   5 mg  Oral  DAILY     ?  dilTIAZem ER (CARDIZEM CD) capsule 240 mg   240 mg  Oral  DAILY     ?  fluticasone-vilanterol (BREO ELLIPTA) 191mcg-25mcg/puff   1 Puff  Inhalation  DAILY     ?  [Held by provider] gabapentin (NEURONTIN) capsule 300 mg   300 mg  Oral  TID     ?  montelukast (SINGULAIR) tablet 10 mg   10 mg  Oral  DAILY     ?  losartan (COZAAR) tablet 50 mg   50 mg  Oral  DAILY     ?  rosuvastatin (CRESTOR) tablet 5 mg   5 mg  Oral  QHS           ?  tiotropium bromide (SPIRIVA RESPIMAT) 2.5 mcg /actuation   1 Puff  Inhalation  DAILY           ?  naloxone (NARCAN) injection 0.1 mg   0.1 mg  IntraVENous  PRN     ?  acetaminophen (TYLENOL) tablet 650 mg   650 mg  Oral  Q4H PRN          Or           ?  acetaminophen (TYLENOL) solution 650 mg   650 mg  Oral  Q4H PRN          Or           ?  acetaminophen (TYLENOL) suppository 650 mg   650 mg  Rectal  Q4H PRN     ?  ondansetron (ZOFRAN) injection 4 mg   4 mg  IntraVENous  Q4H PRN     ?  PHOSPHATE ELECTROLYTE REPLACEMENT PROTOCOL STANDARD DOSING   1 Each  Other  PRN     ?  MAGNESIUM ELECTROLYTE REPLACEMENT PROTOCOL STANDARD DOSING   1 Each  Other  PRN     ?  POTASSIUM ELECTROLYTE REPLACEMENT PROTOCOL STANDARD DOSING   1 Each  Other  PRN     ?  CALCIUM ELECTROLYTE REPLACEMENT PROTOCOL STANDARD DOSING   1 Each  Other  PRN     ?  heparin (porcine) injection 5,000 Units   5,000 Units  SubCUTAneous  Q8H     ?  dextrose (D50) infusion 5-25 g   10-50 mL   IntraVENous  PRN     ?  glucagon (GLUCAGEN) injection 1 mg   1 mg  IntraMUSCular  PRN     ?  insulin glargine (LANTUS) injection 1-100 Units   1-100 Units  SubCUTAneous  QHS     ?  insulin lispro (HUMALOG) injection 1-100 Units   1-100 Units  SubCUTAneous  AC&HS           ?  insulin lispro (HUMALOG) injection 1-100 Units   1-100 Units  SubCUTAneous  PRN           ?  furosemide (LASIX) injection 20 mg   20 mg  IntraVENous  Q12H                    Labs:   Reviewed.      Recent Results (from the past 24 hour(s))  GLUCOSE, POC          Collection Time: 05/27/20  8:43 AM         Result  Value  Ref Range            Glucose (POC)  453 (HH)  65 - 105 mg/dL       GLUCOSE, POC          Collection Time: 05/27/20 12:19 PM         Result  Value  Ref Range            Glucose (POC)  435 (HH)  65 - 105 mg/dL       GLUCOSE, POC          Collection Time: 05/27/20  4:18 PM         Result  Value  Ref Range            Glucose (POC)  419 (HH)  65 - 105 mg/dL       GLUCOSE, POC          Collection Time: 05/27/20  9:00 PM         Result  Value  Ref Range            Glucose (POC)  344 (H)  65 - 105 mg/dL       GLUCOSE, POC          Collection Time: 05/27/20 10:38 PM         Result  Value  Ref Range            Glucose (POC)  319 (H)  65 - 105 mg/dL       METABOLIC PANEL, BASIC          Collection Time: 05/28/20 12:46 AM         Result  Value  Ref Range            Sodium  132 (L)  136 - 145 mEq/L       Potassium  3.5  3.5 - 5.1 mEq/L       Chloride  101  98 - 107 mEq/L       CO2  32  21 - 32 mEq/L       Glucose  302 (H)  74 - 106 mg/dl       BUN  24  7 - 25 mg/dl       Creatinine  0.9  0.6 - 1.3 mg/dl       GFR est AA  >57.8>60.0          GFR est non-AA  >60          Calcium  9.8  8.5 - 10.1 mg/dl            Anion gap  -1 (L)  5 - 15 mmol/L             Imaging:   I have personally reviewed imaging.             Zoe LanNaveen C Tanaysha Alkins, MD         Dragon medical dictation software was used for portions of this report. Unintended voice transcription  errors may have occurred.

## 2020-05-28 NOTE — Progress Notes (Signed)
Hospitalist Progress Note         [facility]    Daily Progress Note: 05/28/2020    Assessment/Plan:     1. 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  Marked left upper lobe emphysema.    2. COPD Exacerbation  3. Acute on chronic diastolic CHF  -Last echo from 06/05/2019 reports EF of 57%, grade 1 diastolic dysfunction, RV size normal with preserved systolic function, insufficient TR to evaluate PASP    4. HTN  5. HLD  6. 1.7 L lower lobe nodule  -noted unchanged on CT from 08/2018  -see #1.    7. HA-Improved  8. Chronic pain  -[place] chronic use of Neurontin 363m    9. Gout-chronic  10. DM2-uncontrolled with hyperglycemia  -last A1c of 6.7 more than one year ago.    11. Decreased ability to ambulate  -reported as worse for several weeks    PLAN:  -Pt reports breathing feels near normal. Currently on 2L N/C O2, reports use of 3L O2 at home  -convert Bipap to prn use if needed.  -No pitting edema on exam, will convert IV lasix to PO dosing for 05/29/20  -Pulm following, recommends Taper of prednisone every 3 days by 182m currently on day 2 of 4052mrednisone.   -A/A nebs ordered TID with daily spiriva. Continue Breo for home advair.  Continue zyrtec, singulair. PRN flonase.  -will cancel sputum cx on 12/16 as breathing reported as near baseline.  -Some nausea and abd pain reported this AM, but resolved with normal LFTs.   -Continue Crestor  -Continue Norvasc, losartan, Cardizem  -Continue allopurinol  -will resume gabapentin  -on 12/16 pt and spouse report increased skin darkening in B feet, and inability to walk for several weeks. Strength at B feet is symmetrical and full. Pulses intact. Check B arterial PVLs and consult PT/OT  -BG uncontrolled, Recently off IV steroids. Check A1c and adjust glucommander tomorrow if remains uncontrolled.    PPX:  Heparin TID    Dispo:  -Monitor off Bipap, appears to be doing well on less than home amount of O2.  -Consult therapy services to assess for ambulation.    Subjective:     Pt reports that breathing feels near baseline.  She reports use of 3L N/C O2 at home. She and her husband report that she has not walked for several weeks. Denies acute pain or swelling to legs.    Objective:   Physical Exam:     Visit Vitals  BP (!) 140/98 (BP 1 Location: Right arm, BP Patient Position: Supine)   Pulse 94   Temp 99.3 ??F (37.4 ??C)   Resp 19   Ht _0  (1.702 m)   Wt 96.2 kg (212 lb 1.3 oz)   SpO2 96%   BMI 33.22 kg/m??    O2 Flow Rate (L/min): 2 l/min O2 Device: Nasal cannula    Temp (24hrs), Avg:98.3 ??F (36.8 ??C), Min:97 ??F (36.1 ??C), Max:99.3 ??F (37.4 ??C)    12/16 0701 - 12/16 1900  In: -   Out: 500 [Urine:500]   12/14 1901 - 12/16 0700  In: 240 [P.O.:240]  Out: 3858469rine:3850]    General: Alert, Obese AAF, pleasant to conversation, in no acute distress, on 2L N/C O2.  CV: Regular rate and rhythm, no murmurs,  rubs, gallops  Pulm: Lungs with some slight wheeze posteriorly at L lower lung field, no rales or rhonchi. Normal work of breathing.  GI: Soft, nontender, nondistended, normal bowel sounds  Extremity: No clubbing, cyanosis, no pitting lower leg edema, +2 B DP pulses with CRT < 2s at B great toes.  Skin: Warm, dry, with some chronic skin darkening at B feet.   Neuro: 5 out of 5 strength in the bilateral feet to dorsal and plantar flexion.      Data Review:       24 Hour Results:  Recent Results (from the past 24 hour(s))   GLUCOSE, POC    Collection Time: 05/27/20  9:00 PM   Result Value Ref Range    Glucose (POC) 344 (H) 65 - 105 mg/dL   GLUCOSE, POC    Collection Time: 05/27/20 10:38 PM   Result Value Ref Range    Glucose (POC) 319 (H) 65 - 350 mg/dL   METABOLIC PANEL, BASIC    Collection Time: 05/28/20 12:46 AM   Result Value Ref Range    Sodium 132 (L) 136 - 145 mEq/L    Potassium 3.5 3.5 - 5.1 mEq/L    Chloride 101 98  - 107 mEq/L    CO2 32 21 - 32 mEq/L    Glucose 302 (H) 74 - 106 mg/dl    BUN 24 7 - 25 mg/dl    Creatinine 0.9 0.6 - 1.3 mg/dl    GFR est AA >60.0      GFR est non-AA >60      Calcium 9.8 8.5 - 10.1 mg/dl    Anion gap -1 (L) 5 - 15 mmol/L   GLUCOSE, POC    Collection Time: 05/28/20  8:13 AM   Result Value Ref Range    Glucose (POC) 359 (H) 65 - 105 mg/dL   POTASSIUM    Collection Time: 05/28/20 10:08 AM   Result Value Ref Range    Potassium 4.0 3.5 - 5.1 mEq/L   HEPATIC FUNCTION PANEL    Collection Time: 05/28/20 10:08 AM   Result Value Ref Range    AST (SGOT) 7 (L) 15 - 37 U/L    ALT (SGPT) 16 12 - 78 U/L    Alk. phosphatase 104 45 - 117 U/L    Bilirubin, total 0.8 0.2 - 1.0 mg/dl    Protein, total 8.3 (H) 6.4 - 8.2 gm/dl    Albumin 3.5 3.4 - 5.0 gm/dl    Bilirubin, direct 0.1 0.0 - 0.2 mg/dl   AMYLASE    Collection Time: 05/28/20 10:08 AM   Result Value Ref Range    Amylase, Total 41 25 - 115 U/L   LIPASE    Collection Time: 05/28/20 10:08 AM   Result Value Ref Range    Lipase 130 73 - 393 U/L   GLUCOSE, POC    Collection Time: 05/28/20 11:57 AM   Result Value Ref Range    Glucose (POC) 398 (H) 65 - 105 mg/dL   GLUCOSE, POC    Collection Time: 05/28/20  5:08 PM   Result Value Ref Range    Glucose (POC) 404 (HH) 65 - 105 mg/dL   GLUCOSE, POC    Collection Time: 05/28/20  5:11 PM   Result Value Ref Range    Glucose (POC) 395 (H) 65 - 105 mg/dL       Problem List:  Problem List as of 05/28/2020 Date Reviewed: 10/10/2019  Codes Class Noted - Resolved    UTI (urinary tract infection) ICD-10-CM: N39.0  ICD-9-CM: 599.0  06/01/2019 - Present        Hypoxia ICD-10-CM: R09.02  ICD-9-CM: 799.02  06/01/2019 - Present        COPD exacerbation ([place]) ICD-10-CM: J44.1  ICD-9-CM: 491.21  05/07/2019 - Present        Acute hypoxemic respiratory failure ([place]) ICD-10-CM: J96.01  ICD-9-CM: 518.81  05/07/2019 - Present        Respiratory failure with hypoxia (HCC) ICD-10-CM: J96.91  ICD-9-CM: 518.81  08/22/2018 - Present         Headache ICD-10-CM: R51.9  ICD-9-CM: 784.0  08/05/2018 - Present        Abdominal pain ICD-10-CM: R10.9  ICD-9-CM: 789.00  02/04/2018 - Present        SIRS (systemic inflammatory response syndrome) (HCC) ICD-10-CM: R65.10  ICD-9-CM: 995.90  10/09/2017 - Present        Hyperkalemia ICD-10-CM: E87.5  ICD-9-CM: 276.7  09/08/2017 - Present        Obtunded ICD-10-CM: R40.1  ICD-9-CM: 780.09  09/08/2017 - Present        Acute renal failure (ARF) ([place]) ICD-10-CM: N17.9  ICD-9-CM: 584.9  09/08/2017 - Present        Septic shock ([place]) ICD-10-CM: A41.9, R65.21  ICD-9-CM: 038.9, 785.52, 995.92  09/08/2017 - Present        Metabolic encephalopathy PXT-06-YI: G93.41  ICD-9-CM: 348.31  09/08/2017 - Present        Dehydration ICD-10-CM: E86.0  ICD-9-CM: 276.51  08/07/2017 - Present        COPD with acute exacerbation ([place]) ICD-10-CM: J44.1  ICD-9-CM: 491.21  08/07/2017 - Present        Acute-on-chronic kidney injury ([place]) ICD-10-CM: N17.9, N18.9  ICD-9-CM: 584.9, 585.9  08/07/2017 - Present        Chest pain ICD-10-CM: R07.9  ICD-9-CM: 786.50  06/15/2017 - Present        Dyspnea ICD-10-CM: R06.00  ICD-9-CM: 786.09  06/15/2017 - Present        Type 2 diabetes mellitus with diabetic neuropathy ([place]) ICD-10-CM: E11.40  ICD-9-CM: 250.60, 357.2  12/07/2016 - Present        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, 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  ICD-9-CM: 569.89  08/17/2016 - Present        Cannabinoid hyperemesis syndrome ICD-10-CM: R11.2, F12.90  ICD-9-CM: 536.2, 305.20  08/17/2016 - Present        Gastritis ICD-10-CM: K29.70  ICD-9-CM: 535.50  08/16/2016 - Present        Nausea & vomiting ICD-10-CM: R11.2  ICD-9-CM: 787.01  08/16/2016 - Present        Asthma with acute exacerbation ICD-10-CM: J45.901  ICD-9-CM: 493.92  08/04/2016 - Present        Lactic acidosis ICD-10-CM: E87.2  ICD-9-CM: 276.2  07/24/2016 - Present        Leukocytosis ICD-10-CM: D72.829  ICD-9-CM: 288.60  07/24/2016 - Present        Sepsis ([place]) ICD-10-CM: A41.9  ICD-9-CM: 038.9, 995.91  07/24/2016 -  Present        Asthma exacerbation ICD-10-CM: J45.901  ICD-9-CM: 493.92  07/24/2016 - Present        Type 2 diabetes mellitus with nephropathy ([place]) ICD-10-CM: E11.21  ICD-9-CM: 250.40, 583.81  06/12/2016 - Present        Sacroiliitis ([place]) ICD-10-CM: M46.1  ICD-9-CM: 720.2  11/25/2015 - Present        Spondylosis of lumbar region without myelopathy or radiculopathy  ICD-10-CM: M47.816  ICD-9-CM: 721.3  09/15/2015 - Present        Lumbar and sacral osteoarthritis ICD-10-CM: M47.817  ICD-9-CM: 721.3  09/15/2015 - Present        Chronic pain syndrome ICD-10-CM: G89.4  ICD-9-CM: 338.4  09/15/2015 - Present        Type 2 diabetes mellitus with hyperglycemia, without long-term current use of insulin (HCC) ICD-10-CM: E11.65  ICD-9-CM: 250.00, 790.29  08/20/2015 - Present        Acute colitis ICD-10-CM: K52.9  ICD-9-CM: 558.9  07/02/2015 - Present        Acute hyperglycemia ICD-10-CM: R73.9  ICD-9-CM: 790.29  07/02/2015 - Present        Accelerated hypertension ICD-10-CM: I10  ICD-9-CM: 401.0  04/08/2015 - Present        Severe headache ICD-10-CM: R51.9  ICD-9-CM: 784.0  04/07/2015 - Present        Osteoarthritis of hips, 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  ICD-9-CM: 715.95  01/29/2015 - Present        PTSD (post-traumatic stress disorder) (Chronic) ICD-10-CM: F43.10  ICD-9-CM: 309.81  01/29/2015 - Present        Severe hypertension (Chronic) ICD-10-CM: I10  ICD-9-CM: 401.9  07/21/2014 - Present        RESOLVED: COPD exacerbation ([place]) ICD-10-CM: J44.1  ICD-9-CM: 491.21  11/14/2017 - 08/08/2018        RESOLVED: Essential hypertension ICD-10-CM: I10  ICD-9-CM: 401.9  10/06/2015 - 01/14/2020        RESOLVED: New onset type 1 diabetes mellitus, uncontrolled ([place]) ICD-10-CM: E10.65  ICD-9-CM: 250.03  07/02/2015 - 10/06/2015        RESOLVED: Recurrent major depressive disorder, in full remission ([place]) ICD-10-CM: F33.42  ICD-9-CM: 296.36  12/02/2014 - 12/02/2014        RESOLVED: Foreign body in colon ICD-10-CM: T18.4XXA  ICD-9-CM: 619  08/14/2014 - 01/29/2015         RESOLVED: Advanced care planning/counseling discussion ICD-10-CM: Z71.89  ICD-9-CM: V65.49  07/21/2014 - 01/29/2015    Overview Signed 07/21/2014  3:46 PM by [place]     Patient given [place] of [place] application               RESOLVED: Hip pain, chronic ICD-10-CM: M25.559, G89.29  ICD-9-CM: 719.45, 338.29  07/21/2014 - 01/29/2015               Medications reviewed  Current Facility-Administered Medications   Medication Dose Route Frequency   ??? albuterol-ipratropium (DUO-NEB) 2.5 MG-0.5 MG/3 ML  3 mL Nebulization TID RT   ??? predniSONE (DELTASONE) tablet 40 mg  40 mg Oral DAILY WITH BREAKFAST   ??? butalbital-acetaminophen-caffeine (FIORICET, ESGIC) 50-325-40 mg per tablet 1 Tablet  1 Tablet Oral Q6H PRN   ??? melatonin tablet 6 mg  6 mg Oral QHS PRN   ??? albuterol (ACCUNEB) nebulizer solution 2.5 mg  2.5 mg Nebulization Q4H PRN   ??? allopurinoL (ZYLOPRIM) tablet 100 mg  100 mg Oral BID   ??? amLODIPine (NORVASC) tablet 5 mg  5 mg Oral DAILY   ??? dilTIAZem ER (CARDIZEM CD) capsule 240 mg  240 mg Oral DAILY   ??? fluticasone-vilanterol (BREO ELLIPTA) 137mg-25mcg/puff  1 Puff Inhalation DAILY   ??? [Held by provider] gabapentin (NEURONTIN) capsule 300 mg  300 mg Oral TID   ??? montelukast (SINGULAIR) tablet 10 mg  10 mg Oral DAILY   ??? losartan (COZAAR) tablet 50 mg  50 mg Oral DAILY   ??? rosuvastatin (CRESTOR) tablet 5 mg  5 mg Oral QHS   ??? tiotropium bromide (SPIRIVA RESPIMAT) 2.5 mcg /actuation  1 Puff Inhalation DAILY   ??? naloxone (NARCAN) injection 0.1 mg  0.1 mg IntraVENous PRN   ??? acetaminophen (TYLENOL) tablet 650 mg  650 mg Oral Q4H PRN    Or   ??? acetaminophen (TYLENOL) solution 650 mg  650 mg Oral Q4H PRN    Or   ??? acetaminophen (TYLENOL) suppository 650 mg  650 mg Rectal Q4H PRN   ??? ondansetron (ZOFRAN) injection 4 mg  4 mg IntraVENous Q4H PRN   ??? PHOSPHATE ELECTROLYTE REPLACEMENT PROTOCOL STANDARD DOSING  1 Each Other PRN   ??? MAGNESIUM ELECTROLYTE REPLACEMENT PROTOCOL STANDARD DOSING  1 Each Other PRN   ??? POTASSIUM  ELECTROLYTE REPLACEMENT PROTOCOL STANDARD DOSING  1 Each Other PRN   ??? CALCIUM ELECTROLYTE REPLACEMENT PROTOCOL STANDARD DOSING  1 Each Other PRN   ??? heparin (porcine) injection 5,000 Units  5,000 Units SubCUTAneous Q8H   ??? dextrose (D50) infusion 5-25 g  10-50 mL IntraVENous PRN   ??? glucagon (GLUCAGEN) injection 1 mg  1 mg IntraMUSCular PRN   ??? insulin glargine (LANTUS) injection 1-100 Units  1-100 Units SubCUTAneous QHS   ??? insulin lispro (HUMALOG) injection 1-100 Units  1-100 Units SubCUTAneous AC&HS   ??? insulin lispro (HUMALOG) injection 1-100 Units  1-100 Units SubCUTAneous PRN   ??? furosemide (LASIX) injection 20 mg  20 mg IntraVENous Q12H        Care Plan discussed with: Patient/Family    Total time spent with patient: 27 minutes.    Marzella Schlein, MD  May 28, 2020  17:45

## 2020-05-28 NOTE — Progress Notes (Signed)
Patient refused XR KUB. She said her abdomen was in pain earlier because her aunt just died yesterday.
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
## 2020-05-28 NOTE — Progress Notes (Signed)
TRANSFER - OUT REPORT:    Verbal report given to Fleet Contras, RN (name) on [name] [name]  being transferred to [address] (unit) for routine progression of care       Report consisted of patient's Situation, Background, Assessment and   Recommendations(SBAR).     Information from the following report(s) SBAR, Kardex, Intake/Output, MAR and Cardiac Rhythm SR, ST was reviewed with the receiving nurse.    Lines:   Peripheral IV 05/28/20 Left;Distal Cephalic (Active)   Site Assessment Clean, dry, & intact 05/28/20 0100   Phlebitis Assessment 0 05/28/20 0100   Infiltration Assessment 0 05/28/20 0100   Dressing Status Clean, dry, & intact 05/28/20 0100   Dressing Type Transparent 05/28/20 0100   Hub Color/Line Status Pink;Flushed 05/28/20 0100   Action Taken Wrapped;Open ports on tubing capped 05/28/20 0100   Alcohol Cap Used Yes 05/28/20 0100        Opportunity for questions and clarification was provided.      Patient transported with:   O2 @ 2 liters  Registered Nurse

## 2020-05-28 NOTE — Progress Notes (Signed)
Problem: Breathing Pattern - Ineffective  Goal: *Absence of hypoxia  Outcome: Progressing Towards Goal  Goal: *Use of effective breathing techniques  Outcome: Progressing Towards Goal  Goal: *PALLIATIVE CARE:  Alleviation of Dyspnea  Outcome: Progressing Towards Goal     Problem: Patient Education: Go to Patient Education Activity  Goal: Patient/Family Education  Outcome: Progressing Towards Goal     Problem: Falls - Risk of  Goal: *Absence of Falls  Description: Document Schmid Fall Risk and appropriate interventions in the flowsheet.  Outcome: Progressing Towards Goal  Note: Fall Risk Interventions:  Mobility Interventions: Assess mobility with egress test,Bed/chair exit alarm,Communicate number of staff needed for ambulation/transfer,Patient to call before getting OOB,PT Consult for mobility concerns,PT Consult for assist device competence         Medication Interventions: Bed/chair exit alarm,Evaluate medications/consider consulting pharmacy,Patient to call before getting OOB,Teach patient to arise slowly    Elimination Interventions: Bed/chair exit alarm,Call light in reach,Elevated toilet seat,Patient to call for help with toileting needs,Stay With Me (per policy),Toileting schedule/hourly rounds,Toilet paper/wipes in reach              Problem: Patient Education: Go to Patient Education Activity  Goal: Patient/Family Education  Outcome: Progressing Towards Goal     Problem: Gas Exchange - Impaired  Goal: *Absence of hypoxia  Outcome: Progressing Towards Goal     Problem: Patient Education: Go to Patient Education Activity  Goal: Patient/Family Education  Outcome: Progressing Towards Goal     Problem: Chronic Obstructive Pulmonary Disease (COPD)  Goal: *Oxygen saturation during activity within specified parameters  Outcome: Progressing Towards Goal  Goal: *Able to remain out of bed as prescribed  Outcome: Progressing Towards Goal  Goal: *Absence of hypoxia  Outcome: Progressing Towards Goal  Goal: *Optimize  nutritional status  Outcome: Progressing Towards Goal     Problem: Patient Education: Go to Patient Education Activity  Goal: Patient/Family Education  Outcome: Progressing Towards Goal     Problem: Pain  Goal: *Control of Pain  Outcome: Progressing Towards Goal  Goal: *PALLIATIVE CARE:  Alleviation of Pain  Outcome: Progressing Towards Goal     Problem: Patient Education: Go to Patient Education Activity  Goal: Patient/Family Education  Outcome: Progressing Towards Goal

## 2020-05-28 NOTE — Progress Notes (Signed)
Called pt and spouse, left VM to return my call for CM assessment

## 2020-05-29 LAB — BASIC METABOLIC PANEL
Anion Gap: 5 mmol/L (ref 5–15)
BUN: 35 mg/dl — ABNORMAL HIGH (ref 7–25)
CO2: 29 mEq/L (ref 21–32)
Calcium: 9.8 mg/dl (ref 8.5–10.1)
Chloride: 94 mEq/L — ABNORMAL LOW (ref 98–107)
Creatinine: 1.1 mg/dl (ref 0.6–1.3)
EGFR IF NonAfrican American: 53
GFR African American: >60
Glucose: 493 mg/dl (ref 74–106)
Potassium: 3.7 mEq/L (ref 3.5–5.1)
Sodium: 128 mEq/L — ABNORMAL LOW (ref 136–145)

## 2020-05-29 LAB — POCT GLUCOSE
POC Glucose: 370 mg/dL — ABNORMAL HIGH (ref 65–105)
POC Glucose: 340 mg/dL — ABNORMAL HIGH (ref 65–105)
POC Glucose: 400 mg/dL — ABNORMAL HIGH (ref 65–105)
POC Glucose: 413 mg/dL (ref 65–105)
POC Glucose: 437 mg/dL (ref 65–105)
POC Glucose: 472 mg/dL (ref 65–105)
POC Glucose: 482 mg/dL (ref 65–105)

## 2020-05-29 LAB — HEMOGLOBIN A1C W/O EAG
Hemoglobin A1C: 12.4 % — ABNORMAL HIGH (ref 4.2–5.6)
Hemoglobin A1c: 12.4 % — ABNORMAL HIGH (ref 4.2–5.6)

## 2020-05-29 LAB — METABOLIC PANEL, BASIC
Anion gap: 5 mmol/L (ref 5–15)
BUN: 35 mg/dl — ABNORMAL HIGH (ref 7–25)
CO2: 29 mEq/L (ref 21–32)
Calcium: 9.8 mg/dl (ref 8.5–10.1)
Chloride: 94 mEq/L — ABNORMAL LOW (ref 98–107)
Creatinine: 1.1 mg/dl (ref 0.6–1.3)
GFR est AA: >60
GFR est non-AA: 53
Glucose: 493 mg/dl — CR (ref 74–106)
Potassium: 3.7 mEq/L (ref 3.5–5.1)
Sodium: 128 mEq/L — ABNORMAL LOW (ref 136–145)

## 2020-05-29 LAB — GLUCOSE, POC
Glucose (POC): 370 mg/dL — ABNORMAL HIGH (ref 65–105)
Glucose (POC): 340 mg/dL — ABNORMAL HIGH (ref 65–105)
Glucose (POC): 400 mg/dL — ABNORMAL HIGH (ref 65–105)
Glucose (POC): 413 mg/dL — CR (ref 65–105)
Glucose (POC): 437 mg/dL — CR (ref 65–105)
Glucose (POC): 472 mg/dL — CR (ref 65–105)
Glucose (POC): 482 mg/dL — CR (ref 65–105)

## 2020-05-29 MED ORDER — QUETIAPINE SR 50 MG 24 HR TAB
50 mg | Freq: Every evening | ORAL | Status: DC | PRN
Start: 2020-05-29 — End: 2020-06-02
  Administered 2020-05-30 – 2020-06-02 (×4): via ORAL

## 2020-05-29 MED ORDER — IPRATROPIUM-ALBUTEROL 2.5 MG-0.5 MG/3 ML NEB SOLUTION
2.5 mg-0.5 mg/3 ml | Freq: Two times a day (BID) | RESPIRATORY_TRACT | Status: DC
Start: 2020-05-29 — End: 2020-05-30
  Administered 2020-05-30 (×2): via RESPIRATORY_TRACT

## 2020-05-29 MED ORDER — PREDNISONE 20 MG TAB
20 mg | Freq: Every day | ORAL | Status: AC
Start: 2020-05-29 — End: 2020-06-01
  Administered 2020-05-30 – 2020-06-01 (×3): via ORAL

## 2020-05-29 MED ORDER — QUETIAPINE SR 50 MG 24 HR TAB
50 mg | Freq: Once | ORAL | Status: AC
Start: 2020-05-29 — End: 2020-05-29
  Administered 2020-05-29: 06:00:00 via ORAL

## 2020-05-29 MED FILL — MELATONIN 3 MG TAB: 3 mg | ORAL | Qty: 2

## 2020-05-29 MED FILL — BUTALBITAL-ACETAMINOPHEN-CAFFEINE 50 MG-325 MG-40 MG TAB: 50-325-40 mg | ORAL | Qty: 1

## 2020-05-29 MED FILL — CETIRIZINE 5 MG TAB: 5 mg | ORAL | Qty: 2

## 2020-05-29 MED FILL — MONTELUKAST 10 MG TAB: 10 mg | ORAL | Qty: 1

## 2020-05-29 MED FILL — HEPARIN (PORCINE) 5,000 UNIT/ML IJ SOLN: 5000 unit/mL | INTRAMUSCULAR | Qty: 1

## 2020-05-29 MED FILL — TYLENOL 325 MG TABLET: 325 mg | ORAL | Qty: 2

## 2020-05-29 MED FILL — POTASSIUM CHLORIDE SR 20 MEQ TAB, PARTICLES/CRYSTALS: 20 mEq | ORAL | Qty: 1

## 2020-05-29 MED FILL — GABAPENTIN 300 MG CAP: 300 mg | ORAL | Qty: 1

## 2020-05-29 MED FILL — IPRATROPIUM-ALBUTEROL 2.5 MG-0.5 MG/3 ML NEB SOLUTION: 2.5 mg-0.5 mg/3 ml | RESPIRATORY_TRACT | Qty: 3

## 2020-05-29 MED FILL — ALBUTEROL SULFATE 1.25 MG/3 ML (0.042 %) SOLN FOR INHALATION: 1.25 mg/3 mL | RESPIRATORY_TRACT | Qty: 2

## 2020-05-29 MED FILL — FUROSEMIDE 20 MG TAB: 20 mg | ORAL | Qty: 1

## 2020-05-29 MED FILL — QUETIAPINE SR 50 MG 24 HR TAB: 50 mg | ORAL | Qty: 3

## 2020-05-29 MED FILL — PREDNISONE 20 MG TAB: 20 mg | ORAL | Qty: 2

## 2020-05-29 MED FILL — DILTIAZEM ER 240 MG 24 HR CAP: 240 mg | ORAL | Qty: 1

## 2020-05-29 MED FILL — LOSARTAN 50 MG TAB: 50 mg | ORAL | Qty: 1

## 2020-05-29 MED FILL — ADMELOG U-100 INSULIN LISPRO 100 UNIT/ML SUBCUTANEOUS SOLUTION: 100 unit/mL | SUBCUTANEOUS | Qty: 1

## 2020-05-29 MED FILL — ROSUVASTATIN 5 MG TAB: 5 mg | ORAL | Qty: 1

## 2020-05-29 MED FILL — ALLOPURINOL 100 MG TAB: 100 mg | ORAL | Qty: 1

## 2020-05-29 MED FILL — AMLODIPINE 5 MG TAB: 5 mg | ORAL | Qty: 1

## 2020-05-29 NOTE — Progress Notes (Signed)
Progress Notes by Tedd Sias, MD at 05/29/20 [phone]                Author: Tedd Sias, MD  Service: Pulmonary Disease  Author Type: Physician       Filed: 05/29/20 1012  Date of Service: 05/29/20 0727  Status: Addendum          Editor: Tedd Sias, MD (Physician)          Related Notes: Original Note by Tedd Sias, MD (Physician) filed at 05/29/20 [facility]           Progress Note          Name:  [name] [name]        DOB:  1951-03-24     MRN:  [id number]        Date:  05/29/2020      [x] I have reviewed the flowsheet and previous days notes. Events, vitals, medications and notes from last 24 hours reviewed.         ASSESSMENT/PLAN:     -Acute on chronic hypoxic and hypercapnic respiratory failure requiring BiPAP.  On home O2 2 to 3 L.  Off BiPAP since 05/27/2020.  Doing well on 3L nasal  cannula.  Can use BiPAP as needed.  Titrate fio2 to keep sats more than 88%.   ??   -COPD/asthma exacerbation.  Continue prednisone 40 mg once a day and taper by 10 mg every 3 days until completed.  Continue neb treatments.   ??   -Allergic rhinitis.  Continue cetirizine, Flonase and Singulair.   ??   -COPD.  On Advair and Spiriva Respimat at home.  Sees Dr. Leonia Reader as outpatient.   ??   -Acute on chronic diastolic CHF with lower extremity and upper extremity edema..  Echo 05/2019-EF 19%, grade 1 diastolic dysfunction.  On home Lasix.  proBNP 644.    Change Lasix to 20 mg once a day.        -Nausea/vomiting/abdominal pain.  Abdominal exam benign.  LFTs, amylase lipase normal 05/28/2020.  Zofran as needed.  Resolved.     ??   -Hypertension.  On Cardizem, losartan and Norvasc.   ??   -Hyperlipidemia   ??   -1.7 cm right lower lobe nodule.  Stable on CT from May 2018 to March 2020.  Seen on x-ray 05/26/2020.  Follow-up with Dr. Leonia Reader as outpatient.   ??   -Continue heparin for DVT prophylaxis.   ??   -Discussed with patient, RN      Improved from PCCM  standpoint.  Can be discharged home from [facility] standpoint.  We will sign off.  Follow-up with Dr. Leonia Reader in 2 to 4 weeks.                   SUBJECTIVE:   No shortness of breath or chest tightness or wheezing.  Leg edema resolved.          ROS:    Otherwise negative except for as mentioned in subjective.       Allergy:     Allergies        Allergen  Reactions         ?  Chocolate [Cocoa]  Sneezing  Confirms allergy but reports "I still eat it"            Vital Signs:       Visit Vitals   BP  (!) 145/99 (BP 1 Location: Right arm, BP Patient Position: Supine)      Pulse  95      Temp  98.6 ??F (37 ??C)      Resp  19      Ht  5' 7" (1.702 m)      Wt  96.2 kg (212 lb 1.3 oz)      SpO2  97%      BMI  33.22 kg/m??                O2 Device: Nasal cannula     O2 Flow Rate (L/min): 3 l/min       Temp (24hrs), Avg:98.3 ??F (36.8 ??C), Min:97 ??F (36.1 ??C), Max:99.3 ??F (37.4 ??C)           Patient Vitals for the past 8 hrs:            Temp  Pulse  Resp  BP  SpO2            05/29/20 0226  --  --  --  --  97 %            05/28/20 2356  98.6 ??F (37 ??C)  95  19  (!) 145/99  100 %              Intake/Output:       Last shift:      No intake/output data recorded.   Last 3 shifts: 12/15 1901 - 12/17 0700   In: 240 [P.O.:240]   Out: 2450 [Urine:2450]      Intake/Output Summary (Last 24 hours) at 05/29/2020 0727   Last data filed at 05/29/2020 0654     Gross per 24 hour        Intake  --        Output  1450 ml        Net  -1450 ml               Physical Exam:   GENERAL: AAO x4 .    NECK: Supple. Trachea midline.    RESPIRATORY: Bilateral BS present, decreased at bases. No rales or rhonchi.    CARDIOVASCULAR: S1 and S2 present, regular. No murmur, rub, or thrill.    ABDOMEN: Soft and nontender with positive bowel sounds. No organomegaly.    NEUROLOGICAL: No focal deficits   EXT: No edema or cyanosis.          DATA:         Current Facility-Administered Medications          Medication  Dose  Route  Frequency           ?   albuterol-ipratropium (DUO-NEB) 2.5 MG-0.5 MG/3 ML   3 mL  Nebulization  TID RT     ?  furosemide (LASIX) tablet 20 mg   20 mg  Oral  DAILY     ?  cetirizine (ZYRTEC) tablet 10 mg   10 mg  Oral  DAILY     ?  fluticasone propionate (FLONASE) 50 mcg/actuation nasal spray 2 Spray   2 Spray  Both Nostrils  DAILY PRN     ?  potassium chloride (K-DUR, KLOR-CON M20) SR tablet 20 mEq   20 mEq  Oral  DAILY     ?  predniSONE (DELTASONE) tablet 40 mg   40 mg  Oral  DAILY WITH BREAKFAST     ?  butalbital-acetaminophen-caffeine (FIORICET, ESGIC) 50-325-40 mg per tablet 1 Tablet   1 Tablet  Oral  Q6H PRN     ?  melatonin tablet 6 mg   6 mg  Oral  QHS PRN     ?  albuterol (ACCUNEB) nebulizer solution 2.5 mg   2.5 mg  Nebulization  Q4H PRN     ?  allopurinoL (ZYLOPRIM) tablet 100 mg   100 mg  Oral  BID     ?  amLODIPine (NORVASC) tablet 5 mg   5 mg  Oral  DAILY     ?  dilTIAZem ER (CARDIZEM CD) capsule 240 mg   240 mg  Oral  DAILY     ?  fluticasone-vilanterol (BREO ELLIPTA) 115mg-25mcg/puff   1 Puff  Inhalation  DAILY           ?  gabapentin (NEURONTIN) capsule 300 mg   300 mg  Oral  TID           ?  montelukast (SINGULAIR) tablet 10 mg   10 mg  Oral  DAILY     ?  losartan (COZAAR) tablet 50 mg   50 mg  Oral  DAILY     ?  rosuvastatin (CRESTOR) tablet 5 mg   5 mg  Oral  QHS     ?  tiotropium bromide (SPIRIVA RESPIMAT) 2.5 mcg /actuation   1 Puff  Inhalation  DAILY     ?  naloxone (NARCAN) injection 0.1 mg   0.1 mg  IntraVENous  PRN     ?  acetaminophen (TYLENOL) tablet 650 mg   650 mg  Oral  Q4H PRN          Or           ?  acetaminophen (TYLENOL) solution 650 mg   650 mg  Oral  Q4H PRN          Or           ?  acetaminophen (TYLENOL) suppository 650 mg   650 mg  Rectal  Q4H PRN     ?  ondansetron (ZOFRAN) injection 4 mg   4 mg  IntraVENous  Q4H PRN     ?  heparin (porcine) injection 5,000 Units   5,000 Units  SubCUTAneous  Q8H     ?  dextrose (D50) infusion 5-25 g   10-50 mL  IntraVENous  PRN     ?  glucagon (GLUCAGEN) injection  1 mg   1 mg  IntraMUSCular  PRN     ?  insulin glargine (LANTUS) injection 1-100 Units   1-100 Units  SubCUTAneous  QHS     ?  insulin lispro (HUMALOG) injection 1-100 Units   1-100 Units  SubCUTAneous  AC&HS           ?  insulin lispro (HUMALOG) injection 1-100 Units   1-100 Units  SubCUTAneous  PRN                    Labs:   Reviewed.      Recent Results (from the past 24 hour(s))     GLUCOSE, POC          Collection Time: 05/28/20  8:13 AM         Result  Value  Ref Range  Glucose (POC)  359 (H)  65 - 105 mg/dL       POTASSIUM          Collection Time: 05/28/20 10:08 AM         Result  Value  Ref Range            Potassium  4.0  3.5 - 5.1 mEq/L       HEPATIC FUNCTION PANEL          Collection Time: 05/28/20 10:08 AM         Result  Value  Ref Range            AST (SGOT)  7 (L)  15 - 37 U/L       ALT (SGPT)  16  12 - 78 U/L       Alk. phosphatase  104  45 - 117 U/L       Bilirubin, total  0.8  0.2 - 1.0 mg/dl       Protein, total  8.3 (H)  6.4 - 8.2 gm/dl       Albumin  3.5  3.4 - 5.0 gm/dl       Bilirubin, direct  0.1  0.0 - 0.2 mg/dl       AMYLASE          Collection Time: 05/28/20 10:08 AM         Result  Value  Ref Range            Amylase, Total  41  25 - 115 U/L       LIPASE          Collection Time: 05/28/20 10:08 AM         Result  Value  Ref Range            Lipase  130  73 - 393 U/L       GLUCOSE, POC          Collection Time: 05/28/20 11:57 AM         Result  Value  Ref Range            Glucose (POC)  398 (H)  65 - 105 mg/dL       GLUCOSE, POC          Collection Time: 05/28/20  5:08 PM         Result  Value  Ref Range            Glucose (POC)  404 (HH)  65 - 105 mg/dL       GLUCOSE, POC          Collection Time: 05/28/20  5:11 PM         Result  Value  Ref Range            Glucose (POC)  395 (H)  65 - 105 mg/dL       GLUCOSE, POC          Collection Time: 05/28/20  9:39 PM         Result  Value  Ref Range            Glucose (POC)  370 (H)  65 - 105 mg/dL             Imaging:   I have  personally reviewed imaging.             Tedd Sias, MD         Dragon medical dictation  software was used for portions of this report. Unintended voice transcription errors may have occurred.

## 2020-05-29 NOTE — Progress Notes (Signed)
Peripheral Vascular Lab Preliminary : Ankle Brachial Index     1. There is no evidence of significant arterial occlusive disease noted in either lower extremity at rest.    Right ABI =1.27  Left ABI = 1.21    Final report to follow  Jovanne Ramirez RVT, RVS

## 2020-05-29 NOTE — Progress Notes (Signed)
PAGER ID:  [phone]   MESSAGE:  [address] [name] [name] [facility]) Dx: Respiratory Failure. Pt demanding her night time dose of Seroquel XR 150 mg. Thanks [phone]
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
## 2020-05-29 NOTE — Progress Notes (Signed)
Current discharge plan is rehab.  Patient would like to go to [facility] (has been in their facility in the past and "they did a great job").  Patient loaded into [facility].  PT/OT consults are pending.  Call placed to [facility] with [place], and she said they can accept the patient.  Patient will need [facility] authorization.  Continue to monitor.

## 2020-05-29 NOTE — Progress Notes (Signed)
Problem: Gas Exchange - Impaired  Goal: *Absence of hypoxia  Outcome: Progressing Towards Goal

## 2020-05-29 NOTE — Progress Notes (Signed)
Hospitalist Progress Note         [facility]    Daily Progress Note: 05/29/2020    Assessment/Plan:     1. 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  Marked left upper lobe emphysema.    2. COPD Exacerbation-Improved  3. Acute on chronic diastolic CHF  -Last echo from 65/78/4696 reports EF of 63%, grade 1 diastolic dysfunction, RV size normal with preserved systolic function, insufficient TR to evaluate PASP    4. HTN  5. HLD  6. 1.7 L lower lobe nodule  -noted unchanged on CT from 08/2018  -see #1.    7. HA-Improved  8. Chronic pain  -[place] chronic use of Neurontin 300mg     9. Gout-chronic  10. DM2-uncontrolled with hyperglycemia  -a1c of 12.4 on 05/29/20    11. Decreased ability to ambulate  -suspect due to diabetic neuropathy  -reported as worse for several weeks  -Arterial PVL studies on 12/17 report no evidence of arterial insufficiency in the B LE.    PLAN:  -Pt reports breathing feels near normal. Currently on 3L N/C O2, reports use of 3L O2 at home  -convert Bipap to prn use if needed.  -No pitting edema on exam, convert lasix to PO dosing for 05/29/20  -Pulm following, recommends Taper of prednisone every 3 days by 10mg , decrease to 30mg  daily for 12/18.   -A/A nebs, decrease to BID and if stable tomorrow, convert to PRN. Continue daily spiriva. Continue Breo for home advair.  Continue zyrtec, singulair. PRN flonase.  -Continue Crestor  -Continue Norvasc, losartan, Cardizem  -Continue allopurinol  -will resume gabapentin  -on 12/16 pt and spouse report increased skin darkening in B feet, and inability to walk for several weeks. Strength at B feet is symmetrical and full. Pulses intact. Negative arterial PVLs on 12/17, PT recommends SNF.  -BG uncontrolled and worse to 400s today. Adjust glucommander to at least 15 units with meals and  increase lantus to 30 units qHS.  Monitor BMP with markedly elevated BG.    PPX: Heparin TID    Dispo:  -improved from respiratory standpoint  -Adjust glucommander for improved glycemic control.  -PT recommends SNF, CM following, will need authorization from [facility].     Subjective:     Pt reports that breathing normal. Reports improved appetite. Eager to leave the hospital.  Discussed plans for rehab need for improved glycemic control with pt.    Objective:   Physical Exam:     Visit Vitals  BP (!) 149/94 (BP 1 Location: Right arm, BP Patient Position: Supine)   Pulse (!) 102   Temp 98.5 ??F (36.9 ??C)   Resp 19   Ht 5\' 7"  (1.702 m)   Wt 96.2 kg (212 lb 1.3 oz)   SpO2 95%   BMI 33.22 kg/m??    O2 Flow Rate (L/min): 3 l/min O2 Device: Nasal cannula    Temp (24hrs), Avg:98.5 ??F (36.9 ??C), Min:98.1 ??F (36.7 ??C), Max:98.8 ??F (37.1 ??C)    12/17 0701 - 12/17 1900  In: -   Out: 600 [Urine:600]   12/15 1901 - 12/17 0700  In: 240 [P.O.:240]  Out: 2450 [Urine:2450]    General: Alert, Obese AAF, pleasant to conversation, in no acute distress, on 3L N/C O2.  CV: Regular rate and rhythm, no murmurs,  rubs, gallops  Pulm: Lungs without wheeze, rales or rhonchi. Normal work of breathing.  GI: Soft, nontender, nondistended, normal bowel sounds  Extremity: No clubbing, cyanosis, no pitting lower leg edema, CRT < 2s at fingertips  Skin: Warm, dry, with some chronic skin darkening at B feet.       Data Review:       24 Hour Results:  Recent Results (from the past 24 hour(s))   GLUCOSE, POC    Collection Time: 05/28/20  9:39 PM   Result Value Ref Range    Glucose (POC) 370 (H) 65 - 105 mg/dL   GLUCOSE, POC    Collection Time: 05/29/20  8:10 AM   Result Value Ref Range    Glucose (POC) 340 (H) 65 - 105 mg/dL   METABOLIC PANEL, BASIC    Collection Time: 05/29/20 10:40 AM   Result Value Ref Range    Sodium 128 (L) 136 - 145 mEq/L    Potassium 3.7 3.5 - 5.1 mEq/L    Chloride 94 (L) 98 - 107 mEq/L    CO2 29 21 - 32 mEq/L    Glucose 493 (HH) 74  - 106 mg/dl    BUN 35 (H) 7 - 25 mg/dl    Creatinine 1.1 0.6 - 1.3 mg/dl    GFR est AA >29.5>60.0      GFR est non-AA 53      Calcium 9.8 8.5 - 10.1 mg/dl    Anion gap 5 5 - 15 mmol/L   HEMOGLOBIN A1C W/O EAG    Collection Time: 05/29/20 10:40 AM   Result Value Ref Range    Hemoglobin A1c 12.4 (H) 4.2 - 5.6 %   GLUCOSE, POC    Collection Time: 05/29/20 11:51 AM   Result Value Ref Range    Glucose (POC) 400 (H) 65 - 105 mg/dL   GLUCOSE, POC    Collection Time: 05/29/20 11:52 AM   Result Value Ref Range    Glucose (POC) 437 (HH) 65 - 105 mg/dL   GLUCOSE, POC    Collection Time: 05/29/20 11:54 AM   Result Value Ref Range    Glucose (POC) 413 (HH) 65 - 105 mg/dL   GLUCOSE, POC    Collection Time: 05/29/20  3:58 PM   Result Value Ref Range    Glucose (POC) 482 (HH) 65 - 105 mg/dL   GLUCOSE, POC    Collection Time: 05/29/20  3:59 PM   Result Value Ref Range    Glucose (POC) 472 (HH) 65 - 105 mg/dL       Problem List:  Problem List as of 05/29/2020 Date Reviewed: 10/10/2019          Codes Class Noted - Resolved    UTI (urinary tract infection) ICD-10-CM: N39.0  ICD-9-CM: 599.0  06/01/2019 - Present        Hypoxia ICD-10-CM: R09.02  ICD-9-CM: 799.02  06/01/2019 - Present        COPD exacerbation (HCC) ICD-10-CM: J44.1  ICD-9-CM: 491.21  05/07/2019 - Present        Acute hypoxemic respiratory failure (HCC) ICD-10-CM: J96.01  ICD-9-CM: 518.81  05/07/2019 - Present        Respiratory failure with hypoxia (HCC) ICD-10-CM: J96.91  ICD-9-CM: 518.81  08/22/2018 - Present        Headache ICD-10-CM: R51.9  ICD-9-CM: 784.0  08/05/2018 - Present        Abdominal pain ICD-10-CM: R10.9  ICD-9-CM: 789.00  02/04/2018 - Present  SIRS (systemic inflammatory response syndrome) (HCC) ICD-10-CM: R65.10  ICD-9-CM: 995.90  10/09/2017 - Present        Hyperkalemia ICD-10-CM: E87.5  ICD-9-CM: 276.7  09/08/2017 - Present        Obtunded ICD-10-CM: R40.1  ICD-9-CM: 780.09  09/08/2017 - Present        Acute renal failure (ARF) (HCC) ICD-10-CM:  N17.9  ICD-9-CM: 584.9  09/08/2017 - Present        Septic shock (HCC) ICD-10-CM: A41.9, R65.21  ICD-9-CM: 038.9, 785.52, 995.92  09/08/2017 - Present        Metabolic encephalopathy ICD-10-CM: G93.41  ICD-9-CM: 348.31  09/08/2017 - Present        Dehydration ICD-10-CM: E86.0  ICD-9-CM: 276.51  08/07/2017 - Present        COPD with acute exacerbation (HCC) ICD-10-CM: J44.1  ICD-9-CM: 491.21  08/07/2017 - Present        Acute-on-chronic kidney injury (HCC) ICD-10-CM: N17.9, N18.9  ICD-9-CM: 584.9, 585.9  08/07/2017 - Present        Chest pain ICD-10-CM: R07.9  ICD-9-CM: 786.50  06/15/2017 - Present        Dyspnea ICD-10-CM: R06.00  ICD-9-CM: 786.09  06/15/2017 - Present        Type 2 diabetes mellitus with diabetic neuropathy (HCC) ICD-10-CM: E11.40  ICD-9-CM: 250.60, 357.2  12/07/2016 - Present        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, 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9  ICD-9-CM: 038.9, 995.91  07/24/2016 - Present        Asthma exacerbation ICD-10-CM: J45.901  ICD-9-CM: 493.92  07/24/2016 - Present        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: E11.21  ICD-9-CM: 250.40, 583.81  06/12/2016 - Present        Sacroiliitis (HCC) ICD-10-CM: M46.1  ICD-9-CM: 720.2  11/25/2015 - Present        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  ICD-9-CM: 721.3  09/15/2015 - Present        Lumbar and sacral  osteoarthritis ICD-10-CM: M47.817  ICD-9-CM: 721.3  09/15/2015 - Present        Chronic pain syndrome ICD-10-CM: G89.4  ICD-9-CM: 338.4  09/15/2015 - Present        Type 2 diabetes mellitus with hyperglycemia, without long-term current use of insulin (HCC) ICD-10-CM: E11.65  ICD-9-CM: 250.00, 790.29  08/20/2015 - Present        Acute colitis ICD-10-CM: K52.9  ICD-9-CM: 558.9  07/02/2015 - Present        Acute hyperglycemia ICD-10-CM: R73.9  ICD-9-CM: 790.29  07/02/2015 - Present        Accelerated hypertension ICD-10-CM: I10  ICD-9-CM: 401.0  04/08/2015 - Present        Severe headache  ICD-10-CM: R51.9  ICD-9-CM: 784.0  04/07/2015 - Present        Osteoarthritis of hips, 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  ICD-9-CM: 715.95  01/29/2015 - Present        PTSD (post-traumatic stress disorder) (Chronic) ICD-10-CM: F43.10  ICD-9-CM: 309.81  01/29/2015 - Present        Severe hypertension (Chronic) ICD-10-CM: I10  ICD-9-CM: 401.9  07/21/2014 - Present        RESOLVED: COPD exacerbation (HCC) ICD-10-CM: J44.1  ICD-9-CM: 491.21  11/14/2017 - 08/08/2018        RESOLVED: Essential hypertension ICD-10-CM: I10  ICD-9-CM: 401.9  10/06/2015 - 01/14/2020        RESOLVED: New onset type 1 diabetes mellitus, uncontrolled (HCC) ICD-10-CM: E10.65  ICD-9-CM: 250.03  07/02/2015 - 10/06/2015        RESOLVED: Recurrent major depressive disorder, in full remission (HCC) ICD-10-CM: F33.42  ICD-9-CM: 296.36  12/02/2014 - 12/02/2014        RESOLVED: Foreign body in colon ICD-10-CM: T18.4XXA  ICD-9-CM: 936  08/14/2014 - 01/29/2015        RESOLVED: Advanced care planning/counseling discussion ICD-10-CM: Z71.89  ICD-9-CM: V65.49  07/21/2014 - 01/29/2015    Overview Signed 07/21/2014  3:46 PM by Littie Deeds     Patient given [place] application               RESOLVED: Hip pain, chronic ICD-10-CM: M25.559, G89.29  ICD-9-CM: 719.45, 338.29  07/21/2014 - 01/29/2015               Medications reviewed  Current Facility-Administered Medications   Medication Dose Route  Frequency   ??? [START ON 05/30/2020] predniSONE (DELTASONE) tablet 30 mg  30 mg Oral DAILY WITH BREAKFAST   ??? albuterol-ipratropium (DUO-NEB) 2.5 MG-0.5 MG/3 ML  3 mL Nebulization BID RT   ??? furosemide (LASIX) tablet 20 mg  20 mg Oral DAILY   ??? cetirizine (ZYRTEC) tablet 10 mg  10 mg Oral DAILY   ??? fluticasone propionate (FLONASE) 50 mcg/actuation nasal spray 2 Spray  2 Spray Both Nostrils DAILY PRN   ??? potassium chloride (K-DUR, KLOR-CON M20) SR tablet 20 mEq  20 mEq Oral DAILY   ??? butalbital-acetaminophen-caffeine (FIORICET, ESGIC) 50-325-40 mg per tablet 1 Tablet  1 Tablet Oral Q6H PRN   ??? melatonin tablet 6 mg  6 mg Oral QHS PRN   ??? albuterol (ACCUNEB) nebulizer solution 2.5 mg  2.5 mg Nebulization Q4H PRN   ??? allopurinoL (ZYLOPRIM) tablet 100 mg  100 mg Oral BID   ??? amLODIPine (NORVASC) tablet 5 mg  5 mg Oral DAILY   ??? dilTIAZem ER (CARDIZEM CD) capsule 240 mg  240 mg Oral DAILY   ??? fluticasone-vilanterol (BREO ELLIPTA) 135mcg-25mcg/puff  1 Puff Inhalation DAILY   ??? gabapentin (NEURONTIN) capsule 300 mg  300 mg Oral TID   ??? montelukast (SINGULAIR) tablet 10 mg  10 mg Oral DAILY   ??? losartan (COZAAR) tablet 50 mg  50 mg Oral DAILY   ??? rosuvastatin (CRESTOR) tablet 5 mg  5 mg Oral QHS   ??? tiotropium bromide (SPIRIVA RESPIMAT) 2.5 mcg /actuation  1 Puff Inhalation DAILY   ??? naloxone (NARCAN) injection 0.1 mg  0.1 mg IntraVENous PRN   ??? acetaminophen (TYLENOL) tablet 650 mg  650 mg Oral Q4H PRN    Or   ??? acetaminophen (TYLENOL) solution 650 mg  650 mg Oral Q4H PRN    Or   ??? acetaminophen (TYLENOL) suppository 650 mg  650 mg  Rectal Q4H PRN   ??? ondansetron (ZOFRAN) injection 4 mg  4 mg IntraVENous Q4H PRN   ??? heparin (porcine) injection 5,000 Units  5,000 Units SubCUTAneous Q8H   ??? dextrose (D50) infusion 5-25 g  10-50 mL IntraVENous PRN   ??? glucagon (GLUCAGEN) injection 1 mg  1 mg IntraMUSCular PRN   ??? insulin glargine (LANTUS) injection 1-100 Units  1-100 Units SubCUTAneous QHS   ??? insulin lispro (HUMALOG) injection  1-100 Units  1-100 Units SubCUTAneous AC&HS   ??? insulin lispro (HUMALOG) injection 1-100 Units  1-100 Units SubCUTAneous PRN        Care Plan discussed with: Patient/Family and Case Manager    Total time spent with patient: 29 minutes.    Scarlette Ar, MD  May 29, 2020  18:16

## 2020-05-30 LAB — BASIC METABOLIC PANEL
Anion Gap: 6 mmol/L (ref 5–15)
BUN: 38 mg/dl — ABNORMAL HIGH (ref 7–25)
CO2: 30 mEq/L (ref 21–32)
Calcium: 8.9 mg/dl (ref 8.5–10.1)
Chloride: 95 mEq/L — ABNORMAL LOW (ref 98–107)
Creatinine: 1.3 mg/dl (ref 0.6–1.3)
EGFR IF NonAfrican American: 43
GFR African American: 53
Glucose: 521 mg/dl (ref 74–106)
Potassium: 4.5 mEq/L (ref 3.5–5.1)
Sodium: 131 mEq/L — ABNORMAL LOW (ref 136–145)

## 2020-05-30 LAB — POCT GLUCOSE
POC Glucose: 366 mg/dL — ABNORMAL HIGH (ref 65–105)
POC Glucose: 293 mg/dL — ABNORMAL HIGH (ref 65–105)
POC Glucose: 459 mg/dL (ref 65–105)
POC Glucose: 504 mg/dL (ref 65–105)
POC Glucose: 446 mg/dL (ref 65–105)

## 2020-05-30 LAB — GLUCOSE, POC
Glucose (POC): 366 mg/dL — ABNORMAL HIGH (ref 65–105)
Glucose (POC): 293 mg/dL — ABNORMAL HIGH (ref 65–105)
Glucose (POC): 459 mg/dL — CR (ref 65–105)
Glucose (POC): 504 mg/dL — CR (ref 65–105)
Glucose (POC): 446 mg/dL — CR (ref 65–105)

## 2020-05-30 LAB — METABOLIC PANEL, BASIC
Anion gap: 6 mmol/L (ref 5–15)
BUN: 38 mg/dl — ABNORMAL HIGH (ref 7–25)
CO2: 30 mEq/L (ref 21–32)
Calcium: 8.9 mg/dl (ref 8.5–10.1)
Chloride: 95 mEq/L — ABNORMAL LOW (ref 98–107)
Creatinine: 1.3 mg/dl (ref 0.6–1.3)
GFR est AA: 53
GFR est non-AA: 43
Glucose: 521 mg/dl — CR (ref 74–106)
Potassium: 4.5 mEq/L (ref 3.5–5.1)
Sodium: 131 mEq/L — ABNORMAL LOW (ref 136–145)

## 2020-05-30 MED ORDER — INSULIN LISPRO 100 UNIT/ML INJECTION
100 unit/mL | Freq: Three times a day (TID) | SUBCUTANEOUS | Status: DC
Start: 2020-05-30 — End: 2020-05-31
  Administered 2020-05-31 (×2): via SUBCUTANEOUS

## 2020-05-30 MED ORDER — TRAMADOL 50 MG TAB
50 mg | Freq: Four times a day (QID) | ORAL | Status: DC | PRN
Start: 2020-05-30 — End: 2020-06-02
  Administered 2020-05-30 – 2020-06-01 (×7): via ORAL

## 2020-05-30 MED ORDER — INSULIN LISPRO 100 UNIT/ML INJECTION
100 unit/mL | Freq: Once | SUBCUTANEOUS | Status: AC
Start: 2020-05-30 — End: 2020-05-30
  Administered 2020-05-30: 22:00:00 via SUBCUTANEOUS

## 2020-05-30 MED ORDER — IPRATROPIUM-ALBUTEROL 2.5 MG-0.5 MG/3 ML NEB SOLUTION
2.5 mg-0.5 mg/3 ml | RESPIRATORY_TRACT | Status: DC | PRN
Start: 2020-05-30 — End: 2020-06-02
  Administered 2020-05-31 – 2020-06-02 (×2): via RESPIRATORY_TRACT

## 2020-05-30 MED ORDER — INSULIN GLARGINE 100 UNIT/ML INJECTION
100 unit/mL | Freq: Every evening | SUBCUTANEOUS | Status: DC
Start: 2020-05-30 — End: 2020-05-31
  Administered 2020-05-31: 03:00:00 via SUBCUTANEOUS

## 2020-05-30 MED FILL — HEPARIN (PORCINE) 5,000 UNIT/ML IJ SOLN: 5000 unit/mL | INTRAMUSCULAR | Qty: 1

## 2020-05-30 MED FILL — GABAPENTIN 300 MG CAP: 300 mg | ORAL | Qty: 1

## 2020-05-30 MED FILL — DILTIAZEM ER 240 MG 24 HR CAP: 240 mg | ORAL | Qty: 1

## 2020-05-30 MED FILL — ROSUVASTATIN 5 MG TAB: 5 mg | ORAL | Qty: 1

## 2020-05-30 MED FILL — ALLOPURINOL 100 MG TAB: 100 mg | ORAL | Qty: 1

## 2020-05-30 MED FILL — MONTELUKAST 10 MG TAB: 10 mg | ORAL | Qty: 1

## 2020-05-30 MED FILL — AMLODIPINE 5 MG TAB: 5 mg | ORAL | Qty: 1

## 2020-05-30 MED FILL — LOSARTAN 50 MG TAB: 50 mg | ORAL | Qty: 1

## 2020-05-30 MED FILL — FUROSEMIDE 20 MG TAB: 20 mg | ORAL | Qty: 1

## 2020-05-30 MED FILL — IPRATROPIUM-ALBUTEROL 2.5 MG-0.5 MG/3 ML NEB SOLUTION: 2.5 mg-0.5 mg/3 ml | RESPIRATORY_TRACT | Qty: 3

## 2020-05-30 MED FILL — TRAMADOL 50 MG TAB: 50 mg | ORAL | Qty: 1

## 2020-05-30 MED FILL — POTASSIUM CHLORIDE SR 20 MEQ TAB, PARTICLES/CRYSTALS: 20 mEq | ORAL | Qty: 1

## 2020-05-30 MED FILL — QUETIAPINE SR 50 MG 24 HR TAB: 50 mg | ORAL | Qty: 3

## 2020-05-30 MED FILL — PREDNISONE 10 MG TAB: 10 mg | ORAL | Qty: 1

## 2020-05-30 MED FILL — CETIRIZINE 5 MG TAB: 5 mg | ORAL | Qty: 2

## 2020-05-30 NOTE — Progress Notes (Signed)
69yo BF    AECOPD  Hx emphysema in non-smoker. 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

## 2020-05-30 NOTE — Progress Notes (Signed)
Bedside shift change report given to Georgia Lopes RN (oncoming nurse) by [employer] (offgoing nurse). Report included the following information SBAR, Kardex, MAR and Cardiac Rhythm Sinus rhythm, sinus tachy.

## 2020-05-30 NOTE — Progress Notes (Signed)
Hospitalist Progress Note         [facility]    Daily Progress Note: 05/30/2020    Assessment/Plan:     1. 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  Marked left upper lobe emphysema.    2. COPD Exacerbation-Improved  3. Acute on chronic diastolic CHF  -Last echo from 02/72/5366 reports EF of 63%, grade 1 diastolic dysfunction, RV size normal with preserved systolic function, insufficient TR to evaluate PASP    4. HTN  5. HLD  6. 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  -Primary Pulmonologist, Dr. 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    7. HA-Improved  8. Chronic pain  -[place] chronic use of Neurontin 300mg     9. Gout-chronic  10. DM2-uncontrolled with hyperglycemia  -a1c of 12.4 on 05/29/20    11. Decreased ability to ambulate  -suspect due to diabetic neuropathy  -reported as worse for several weeks  -Arterial PVL studies on 12/17 report no evidence of arterial insufficiency in the B LE.    PLAN:  -Pt reports breathing feels near normal. Currently on 3L N/C O2, reports use of 3L O2 at home  -has not required Bipap for several days, will d/c.  -No pitting edema on exam, convert lasix to PO dosing for 05/29/20  -Pulm following, recommends Taper of prednisone every 3 days by 10mg , decrease to 30mg  daily for 12/18. Decrease again on 12/21  -A/A nebs convert to PRN. Continue daily spiriva. Continue Breo for home advair.  Continue zyrtec, singulair. PRN flonase.  -Continue Crestor  -Continue Norvasc, losartan, Cardizem  -Continue allopurinol  -Resumed gabapentin  -on 12/16 pt and spouse report increased skin darkening in B feet, and inability to walk for several weeks. Strength at B feet is symmetrical and full. Pulses intact. Negative arterial PVLs on 12/17, PT recommends SNF.  -BG remains  uncontrolled and to 521 this afternoon on BMP. Stop Glucommander and will give 30 units of lispro now, begin 25 units TID with meals tomorrow. Increase lantus to 35 units qHS.  Adjust insulin further as needed.    PPX: Heparin TID    Dispo:  -improved from respiratory standpoint  -Adjust Insulin for improved glycemic control.  -PT recommends SNF, CM following, will need authorization from [facility].     Subjective:     Pt reports that breathing normal. No new complaints. Nursing reports pain not relieved by APAP, but no pain reported by pt at time of interview.    Nursing also reports BG on BMP is 521.  Accucheck is 504    Objective:   Physical Exam:     Visit Vitals  BP 125/87 (BP 1 Location: Left arm, BP Patient Position: Sitting)   Pulse (!) 103   Temp 98.3 ??F (36.8 ??C)   Resp 18   Ht 5\' 7"  (1.702 m)   Wt 96.2 kg (212 lb 1.3 oz)   SpO2 92%   BMI 33.22 kg/m??    O2 Flow Rate (L/min): 3 l/min O2 Device: Nasal cannula    Temp (24hrs), Avg:98 ??F (36.7 ??C), Min:97.5 ??F (36.4 ??C), Max:98.5 ??F (36.9 ??C)    No intake/output data recorded.   12/16 1901 - 12/18 0700  In: 240 [P.O.:240]  Out: 2450 [Urine:2450]  General: Alert, Obese AAF, pleasant to conversation, in no acute distress, on 3L N/C O2.  CV: Regular rate and rhythm, no murmurs, rubs, gallops  Pulm: Lungs without wheeze, rales or rhonchi. Normal work of breathing.  GI: Soft, nontender, nondistended, normal bowel sounds  Extremity: No clubbing, cyanosis, no pitting lower leg edema, CRT < 2s at fingertips  Skin: Warm, dry, with some chronic skin darkening at B feet.       Data Review:       24 Hour Results:  Recent Results (from the past 24 hour(s))   GLUCOSE, POC    Collection Time: 05/29/20  3:58 PM   Result Value Ref Range    Glucose (POC) 482 (HH) 65 - 105 mg/dL   GLUCOSE, POC    Collection Time: 05/29/20  3:59 PM   Result Value Ref Range    Glucose (POC) 472 (HH) 65 - 105 mg/dL   GLUCOSE, POC    Collection Time: 05/29/20 10:17 PM   Result Value Ref Range     Glucose (POC) 366 (H) 65 - 105 mg/dL   GLUCOSE, POC    Collection Time: 05/30/20  7:58 AM   Result Value Ref Range    Glucose (POC) 293 (H) 65 - 105 mg/dL   GLUCOSE, POC    Collection Time: 05/30/20 12:06 PM   Result Value Ref Range    Glucose (POC) 459 (HH) 65 - 105 mg/dL   METABOLIC PANEL, BASIC    Collection Time: 05/30/20  1:26 PM   Result Value Ref Range    Sodium 131 (L) 136 - 145 mEq/L    Potassium 4.5 3.5 - 5.1 mEq/L    Chloride 95 (L) 98 - 107 mEq/L    CO2 30 21 - 32 mEq/L    Glucose 521 (HH) 74 - 106 mg/dl    BUN 38 (H) 7 - 25 mg/dl    Creatinine 1.3 0.6 - 1.3 mg/dl    GFR est AA 16.1      GFR est non-AA 43      Calcium 8.9 8.5 - 10.1 mg/dl    Anion gap 6 5 - 15 mmol/L   GLUCOSE, POC    Collection Time: 05/30/20  2:58 PM   Result Value Ref Range    Glucose (POC) 504 (HH) 65 - 105 mg/dL       Problem List:  Problem List as of 05/30/2020 Date Reviewed: 22-Oct-2019          Codes Class Noted - Resolved    UTI (urinary tract infection) ICD-10-CM: N39.0  ICD-9-CM: 599.0  06/01/2019 - Present        Hypoxia ICD-10-CM: R09.02  ICD-9-CM: 799.02  06/01/2019 - Present        COPD exacerbation (HCC) ICD-10-CM: J44.1  ICD-9-CM: 491.21  05/07/2019 - Present        Acute hypoxemic respiratory failure (HCC) ICD-10-CM: J96.01  ICD-9-CM: 518.81  05/07/2019 - Present        Respiratory failure with hypoxia (HCC) ICD-10-CM: J96.91  ICD-9-CM: 518.81  08/22/2018 - Present        Headache ICD-10-CM: R51.9  ICD-9-CM: 784.0  08/05/2018 - Present        Abdominal pain ICD-10-CM: R10.9  ICD-9-CM: 789.00  02/04/2018 - Present        SIRS (systemic inflammatory response syndrome) (HCC) ICD-10-CM: R65.10  ICD-9-CM: 995.90  10/21/17 - Present        Hyperkalemia ICD-10-CM: E87.5  ICD-9-CM: 276.7  09/08/2017 - Present  Obtunded ICD-10-CM: R40.1  ICD-9-CM: 780.09  09/08/2017 - Present        Acute renal failure (ARF) (HCC) ICD-10-CM: N17.9  ICD-9-CM: 584.9  09/08/2017 - Present        Septic shock (HCC) ICD-10-CM: A41.9, R65.21  ICD-9-CM:  038.9, 785.52, 995.92  09/08/2017 - Present        Metabolic encephalopathy ICD-10-CM: G93.41  ICD-9-CM: 348.31  09/08/2017 - Present        Dehydration ICD-10-CM: E86.0  ICD-9-CM: 276.51  08/07/2017 - Present        COPD with acute exacerbation (HCC) ICD-10-CM: J44.1  ICD-9-CM: 491.21  08/07/2017 - Present        Acute-on-chronic kidney injury (HCC) ICD-10-CM: N17.9, N18.9  ICD-9-CM: 584.9, 585.9  08/07/2017 - Present        Chest pain ICD-10-CM: R07.9  ICD-9-CM: 786.50  06/15/2017 - Present        Dyspnea ICD-10-CM: R06.00  ICD-9-CM: 786.09  06/15/2017 - Present        Type 2 diabetes mellitus with diabetic neuropathy (HCC) ICD-10-CM: E11.40  ICD-9-CM: 250.60, 357.2  12/07/2016 - Present        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, 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9  ICD-9-CM: 038.9, 995.91  07/24/2016 - Present        Asthma exacerbation ICD-10-CM: J45.901  ICD-9-CM: 493.92  07/24/2016 - Present        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: E11.21  ICD-9-CM: 250.40, 583.81  06/12/2016 - Present        Sacroiliitis (HCC) ICD-10-CM: M46.1  ICD-9-CM: 720.2  11/25/2015 - Present        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  ICD-9-CM: 721.3  09/15/2015 - Present        Lumbar and sacral osteoarthritis ICD-10-CM: M47.817  ICD-9-CM: 721.3  09/15/2015 - Present        Chronic pain syndrome ICD-10-CM:  G89.4  ICD-9-CM: 338.4  09/15/2015 - Present        Type 2 diabetes mellitus with hyperglycemia, without long-term current use of insulin (HCC) ICD-10-CM: E11.65  ICD-9-CM: 250.00, 790.29  08/20/2015 - Present        Acute colitis ICD-10-CM: K52.9  ICD-9-CM: 558.9  07/02/2015 - Present        Acute hyperglycemia ICD-10-CM: R73.9  ICD-9-CM: 790.29  07/02/2015 - Present        Accelerated hypertension ICD-10-CM: I10  ICD-9-CM: 401.0  04/08/2015 - Present        Severe headache ICD-10-CM: R51.9  ICD-9-CM: 784.0  04/07/2015 - Present        Osteoarthritis of hips, 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  ICD-9-CM: 715.95  01/29/2015 - Present        PTSD (post-traumatic stress  disorder) (Chronic) ICD-10-CM: F43.10  ICD-9-CM: 309.81  01/29/2015 - Present        Severe hypertension (Chronic) ICD-10-CM: I10  ICD-9-CM: 401.9  07/21/2014 - Present        RESOLVED: COPD exacerbation (HCC) ICD-10-CM: J44.1  ICD-9-CM: 491.21  11/14/2017 - 08/08/2018        RESOLVED: Essential hypertension ICD-10-CM: I10  ICD-9-CM: 401.9  10/06/2015 - 01/14/2020        RESOLVED: New onset type 1 diabetes mellitus, uncontrolled (HCC) ICD-10-CM: E10.65  ICD-9-CM: 250.03  07/02/2015 - 10/06/2015        RESOLVED: Recurrent major depressive disorder, in full remission (HCC) ICD-10-CM: F33.42  ICD-9-CM: 296.36  12/02/2014 - 12/02/2014        RESOLVED: Foreign body in colon ICD-10-CM: T18.4XXA  ICD-9-CM: 936  08/14/2014 - 01/29/2015        RESOLVED: Advanced care planning/counseling discussion ICD-10-CM: Z71.89  ICD-9-CM: V65.49  07/21/2014 - 01/29/2015    Overview Signed 07/21/2014  3:46 PM by Littie Deedsharakan, Jolson K     Patient given [place] application               RESOLVED: Hip pain, chronic ICD-10-CM: M25.559, G89.29  ICD-9-CM: 719.45, 338.29  07/21/2014 - 01/29/2015               Medications reviewed  Current Facility-Administered Medications   Medication Dose Route Frequency   ??? traMADoL (ULTRAM) tablet 50 mg  50 mg Oral Q6H PRN   ??? insulin glargine (LANTUS) injection 35 Units  35  Units SubCUTAneous QHS   ??? [START ON 05/31/2020] insulin lispro (HUMALOG) injection 25 Units  25 Units SubCUTAneous TIDAC   ??? insulin lispro (HUMALOG) injection 30 Units  30 Units SubCUTAneous ONCE   ??? predniSONE (DELTASONE) tablet 30 mg  30 mg Oral DAILY WITH BREAKFAST   ??? albuterol-ipratropium (DUO-NEB) 2.5 MG-0.5 MG/3 ML  3 mL Nebulization BID RT   ??? QUEtiapine SR (SEROquel XR) tablet 150 mg  150 mg Oral QHS PRN   ??? furosemide (LASIX) tablet 20 mg  20 mg Oral DAILY   ??? cetirizine (ZYRTEC) tablet 10 mg  10 mg Oral DAILY   ??? fluticasone propionate (FLONASE) 50 mcg/actuation nasal spray 2 Spray  2 Spray Both Nostrils DAILY PRN   ??? potassium chloride (K-DUR, KLOR-CON M20) SR tablet 20 mEq  20 mEq Oral DAILY   ??? butalbital-acetaminophen-caffeine (FIORICET, ESGIC) 50-325-40 mg per tablet 1 Tablet  1 Tablet Oral Q6H PRN   ??? melatonin tablet 6 mg  6 mg Oral QHS PRN   ??? albuterol (ACCUNEB) nebulizer solution 2.5 mg  2.5 mg Nebulization Q4H PRN   ??? allopurinoL (ZYLOPRIM) tablet 100 mg  100 mg Oral BID   ??? amLODIPine (NORVASC) tablet 5 mg  5 mg Oral DAILY   ??? dilTIAZem ER (CARDIZEM CD) capsule 240 mg  240 mg Oral DAILY   ??? fluticasone-vilanterol (BREO ELLIPTA) 15200mcg-25mcg/puff  1 Puff Inhalation DAILY   ??? gabapentin (NEURONTIN) capsule 300 mg  300 mg Oral TID   ??? montelukast (SINGULAIR) tablet 10 mg  10 mg Oral DAILY   ??? losartan (COZAAR) tablet 50 mg  50 mg Oral DAILY   ??? rosuvastatin (CRESTOR) tablet 5 mg  5 mg Oral QHS   ??? tiotropium bromide (SPIRIVA RESPIMAT) 2.5 mcg /actuation  1 Puff Inhalation DAILY   ??? naloxone (NARCAN) injection 0.1 mg  0.1 mg IntraVENous PRN   ??? acetaminophen (TYLENOL) tablet 650 mg  650 mg Oral Q4H PRN  Or   ??? acetaminophen (TYLENOL) solution 650 mg  650 mg Oral Q4H PRN    Or   ??? acetaminophen (TYLENOL) suppository 650 mg  650 mg Rectal Q4H PRN   ??? ondansetron (ZOFRAN) injection 4 mg  4 mg IntraVENous Q4H PRN   ??? heparin (porcine) injection 5,000 Units  5,000 Units SubCUTAneous Q8H   ???  dextrose (D50) infusion 5-25 g  10-50 mL IntraVENous PRN   ??? glucagon (GLUCAGEN) injection 1 mg  1 mg IntraMUSCular PRN        Care Plan discussed with: Patient/Family, Nurse and Consultant Dr. Helene Shoe    Total time spent with patient: 27 minutes.    Scarlette Ar, MD  May 30, 2020  15:25

## 2020-05-30 NOTE — Progress Notes (Signed)
Received a critical lab result from lab d/t Pt's glucose being 521. MD made aware and I checked Pts blood sugar with accucheck and it was still critically high. MD made aware that Pts blood sugar was 504. Will continue to monitor Pt.

## 2020-05-30 NOTE — Progress Notes (Signed)
Problem: Breathing Pattern - Ineffective  Goal: *Absence of hypoxia  Outcome: Progressing Towards Goal

## 2020-05-31 LAB — POCT GLUCOSE
POC Glucose: 284 mg/dL — ABNORMAL HIGH (ref 65–105)
POC Glucose: 300 mg/dL — ABNORMAL HIGH (ref 65–105)
POC Glucose: 464 mg/dL (ref 65–105)
POC Glucose: 476 mg/dL (ref 65–105)
POC Glucose: 452 mg/dL (ref 65–105)

## 2020-05-31 LAB — GLUCOSE, POC
Glucose (POC): 284 mg/dL — ABNORMAL HIGH (ref 65–105)
Glucose (POC): 300 mg/dL — ABNORMAL HIGH (ref 65–105)
Glucose (POC): 464 mg/dL — CR (ref 65–105)
Glucose (POC): 476 mg/dL — CR (ref 65–105)
Glucose (POC): 452 mg/dL — CR (ref 65–105)

## 2020-05-31 MED ORDER — PREDNISONE 20 MG TAB
20 mg | Freq: Every day | ORAL | Status: DC
Start: 2020-05-31 — End: 2020-06-02
  Administered 2020-06-02: 14:00:00 via ORAL

## 2020-05-31 MED ORDER — INSULIN LISPRO 100 UNIT/ML INJECTION
100 unit/mL | Freq: Three times a day (TID) | SUBCUTANEOUS | Status: DC
Start: 2020-05-31 — End: 2020-06-02
  Administered 2020-05-31 – 2020-06-02 (×6): via SUBCUTANEOUS

## 2020-05-31 MED ORDER — IPRATROPIUM-ALBUTEROL 2.5 MG-0.5 MG/3 ML NEB SOLUTION
2.5 mg-0.5 mg/3 ml | RESPIRATORY_TRACT | Status: AC
Start: 2020-05-31 — End: 2020-06-01
  Administered 2020-05-31: 20:00:00

## 2020-05-31 MED ORDER — INSULIN GLARGINE 100 UNIT/ML INJECTION
100 unit/mL | Freq: Every evening | SUBCUTANEOUS | Status: DC
Start: 2020-05-31 — End: 2020-06-02
  Administered 2020-06-01 – 2020-06-02 (×2): via SUBCUTANEOUS

## 2020-05-31 MED FILL — HEPARIN (PORCINE) 5,000 UNIT/ML IJ SOLN: 5000 unit/mL | INTRAMUSCULAR | Qty: 1

## 2020-05-31 MED FILL — GABAPENTIN 300 MG CAP: 300 mg | ORAL | Qty: 1

## 2020-05-31 MED FILL — QUETIAPINE SR 50 MG 24 HR TAB: 50 mg | ORAL | Qty: 3

## 2020-05-31 MED FILL — TRAMADOL 50 MG TAB: 50 mg | ORAL | Qty: 1

## 2020-05-31 MED FILL — CETIRIZINE 5 MG TAB: 5 mg | ORAL | Qty: 2

## 2020-05-31 MED FILL — IPRATROPIUM-ALBUTEROL 2.5 MG-0.5 MG/3 ML NEB SOLUTION: 2.5 mg-0.5 mg/3 ml | RESPIRATORY_TRACT | Qty: 3

## 2020-05-31 MED FILL — AMLODIPINE 5 MG TAB: 5 mg | ORAL | Qty: 1

## 2020-05-31 MED FILL — POTASSIUM CHLORIDE SR 20 MEQ TAB, PARTICLES/CRYSTALS: 20 mEq | ORAL | Qty: 1

## 2020-05-31 MED FILL — INSULIN GLARGINE 100 UNIT/ML INJECTION: 100 unit/mL | SUBCUTANEOUS | Qty: 1

## 2020-05-31 MED FILL — DILTIAZEM ER 240 MG 24 HR CAP: 240 mg | ORAL | Qty: 1

## 2020-05-31 MED FILL — FUROSEMIDE 20 MG TAB: 20 mg | ORAL | Qty: 1

## 2020-05-31 MED FILL — ALLOPURINOL 100 MG TAB: 100 mg | ORAL | Qty: 1

## 2020-05-31 MED FILL — PREDNISONE 10 MG TAB: 10 mg | ORAL | Qty: 1

## 2020-05-31 MED FILL — ROSUVASTATIN 5 MG TAB: 5 mg | ORAL | Qty: 1

## 2020-05-31 MED FILL — LOSARTAN 50 MG TAB: 50 mg | ORAL | Qty: 1

## 2020-05-31 MED FILL — MONTELUKAST 10 MG TAB: 10 mg | ORAL | Qty: 1

## 2020-05-31 NOTE — Progress Notes (Signed)
PHYSICAL THERAPY         Patient: [name] [name] (69 y.o. female)  Room: 2224/2224    Date: 05/31/2020  Time:  07:02  Primary Diagnosis: COPD exacerbation (HCC) [J44.1]  Hypoxia [R09.02]    Orders reviewed, chart reviewed.     Duplicate PT order received and acknowledged.  Patient was last seen for skilled physical therapy on 12/17. Please refer to formal documentation for patient's functional ability/status and discharge recommendations.  PT will follow up as schedule permits.    Delia Chimes, DPT

## 2020-05-31 NOTE — Progress Notes (Signed)
Bedside shift change report given to Georgia Lopes RN (oncoming nurse) by Ignacia Palma (offgoing nurse). Report included the following information SBAR, Kardex, MAR and Cardiac Rhythm NSR.

## 2020-05-31 NOTE — Progress Notes (Signed)
Bedside and Verbal shift change report given to Kat RN (oncoming nurse) by Bini RN (offgoing nurse). Report included the following information SBAR, Kardex, MAR and Cardiac Rhythm NSR.

## 2020-05-31 NOTE — Progress Notes (Signed)
Hospitalist Progress Note         [facility]    Daily Progress Note: 05/31/2020    Assessment/Plan:     1. 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  Marked left upper lobe emphysema.    2. COPD Exacerbation-Improved  3. Acute on chronic diastolic CHF  -Last echo from 51/88/4166 reports EF of 63%, grade 1 diastolic dysfunction, RV size normal with preserved systolic function, insufficient TR to evaluate PASP    4. HTN  5. HLD  6. 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  -Primary Pulmonologist, Dr. 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    7. HA-Improved  8. Chronic pain  -[place] chronic use of Neurontin     9. Gout-chronic  10. DM2-uncontrolled with hyperglycemia  -a1c of 12.4 on 05/29/20    11. Decreased ability to ambulate  -suspect due to diabetic neuropathy  -reported as worse for several weeks  -Arterial PVL studies on 12/17 report no evidence of arterial insufficiency in the B LE.    PLAN:  -Pt reports breathing feels near normal. Currently on 3L N/C O2, reports use of 3L O2 at home. Respiratory status is stable.  -No pitting edema on exam, convert lasix to PO dosing for 05/29/20  -Pulm following, recommends Taper of prednisone every 3 days by , decrease to  daily for 12/18. Decrease again on 12/21  -A/A nebs converted to PRN. Continue daily spiriva. Continue Breo for home advair.  -Continue zyrtec, singulair. PRN flonase.  -Continue Crestor  -Continue Norvasc, losartan, Cardizem  -Continue allopurinol  -Resumed gabapentin  -on 12/16 pt and spouse report increased skin darkening in B feet, and inability to walk for several weeks. Strength at B feet is symmetrical and full. Pulses intact. Negative arterial PVLs on 12/17, PT recommends SNF.  -BG remains uncontrolled and  higher during the day. Stopped Glucommander on 12/18.  Increase lantus to 40 units qHS, and increase lispro to 30 units TID.  Adjust insulin further as needed.    PPX: Heparin TID    Dispo:  -improved from respiratory standpoint  -Adjust Insulin for improved glycemic control.  -PT recommends SNF, CM following, awaiting authorization from [facility].     Subjective:     Pt reports that breathing normal. No new complaints today.    Objective:   Physical Exam:     Visit Vitals  BP 126/85 (BP 1 Location: Right arm, BP Patient Position: Supine)   Pulse 99   Temp 97.8 ??F (36.6 ??C)   Resp 16   Ht  (1.702 m)   Wt 96.2 kg (212 lb 1.3 oz)   SpO2 95%   BMI 33.22 kg/m??    O2 Flow Rate (L/min): 3 l/min O2 Device: Nasal cannula    Temp (24hrs), Avg:98.3 ??F (36.8 ??C), Min:97.8 ??F (36.6 ??C), Max:99.1 ??F (37.3 ??C)    12/19 0701 - 12/19 1900  In: 1210 [P.O.:1210]  Out: -    12/17 1901 - 12/19 0700  In: 240 [P.O.:240]  Out: 2450 [Urine:2450]    General: Alert, Obese AAF, pleasant to conversation, in no acute distress, on 3L N/C O2.  CV: Regular rate and rhythm, no murmurs, rubs, gallops  Pulm: Lungs without wheeze, rales or rhonchi. Normal work  of breathing.  GI: Soft, nontender, nondistended, normal bowel sounds  Extremity: No clubbing, cyanosis, no pitting lower leg edema, CRT < 2s at fingertips  Skin: Warm, dry, with some chronic skin darkening at B feet.       Data Review:       24 Hour Results:  Recent Results (from the past 24 hour(s))   METABOLIC PANEL, BASIC    Collection Time: 05/30/20  1:26 PM   Result Value Ref Range    Sodium 131 (L) 136 - 145 mEq/L    Potassium 4.5 3.5 - 5.1 mEq/L    Chloride 95 (L) 98 - 107 mEq/L    CO2 30 21 - 32 mEq/L    Glucose 521 (HH) 74 - 106 mg/dl    BUN 38 (H) 7 - 25 mg/dl    Creatinine 1.3 0.6 - 1.3 mg/dl    GFR est AA 08.6      GFR est non-AA 43      Calcium 8.9 8.5 - 10.1 mg/dl    Anion gap 6 5 - 15 mmol/L   GLUCOSE, POC    Collection Time: 05/30/20  2:58 PM   Result Value Ref Range     Glucose (POC) 504 (HH) 65 - 105 mg/dL   GLUCOSE, POC    Collection Time: 05/30/20  4:39 PM   Result Value Ref Range    Glucose (POC) 446 (HH) 65 - 105 mg/dL   GLUCOSE, POC    Collection Time: 05/30/20 10:36 PM   Result Value Ref Range    Glucose (POC) 284 (H) 65 - 105 mg/dL   GLUCOSE, POC    Collection Time: 05/31/20  7:36 AM   Result Value Ref Range    Glucose (POC) 300 (H) 65 - 105 mg/dL   GLUCOSE, POC    Collection Time: 05/31/20 11:44 AM   Result Value Ref Range    Glucose (POC) 464 (HH) 65 - 105 mg/dL   GLUCOSE, POC    Collection Time: 05/31/20 11:45 AM   Result Value Ref Range    Glucose (POC) 476 (HH) 65 - 105 mg/dL       Problem List:  Problem List as of 05/31/2020 Date Reviewed: 10/24/2019          Codes Class Noted - Resolved    UTI (urinary tract infection) ICD-10-CM: N39.0  ICD-9-CM: 599.0  06/01/2019 - Present        Hypoxia ICD-10-CM: R09.02  ICD-9-CM: 799.02  06/01/2019 - Present        COPD exacerbation (HCC) ICD-10-CM: J44.1  ICD-9-CM: 491.21  05/07/2019 - Present        Acute hypoxemic respiratory failure (HCC) ICD-10-CM: J96.01  ICD-9-CM: 518.81  05/07/2019 - Present        Respiratory failure with hypoxia (HCC) ICD-10-CM: J96.91  ICD-9-CM: 518.81  08/22/2018 - Present        Headache ICD-10-CM: R51.9  ICD-9-CM: 784.0  08/05/2018 - Present        Abdominal pain ICD-10-CM: R10.9  ICD-9-CM: 789.00  02/04/2018 - Present        SIRS (systemic inflammatory response syndrome) (HCC) ICD-10-CM: R65.10  ICD-9-CM: 995.90  10-23-2017 - Present        Hyperkalemia ICD-10-CM: E87.5  ICD-9-CM: 276.7  09/08/2017 - Present        Obtunded ICD-10-CM: R40.1  ICD-9-CM: 780.09  09/08/2017 - Present        Acute renal failure (ARF) (HCC) ICD-10-CM: N17.9  ICD-9-CM: 584.9  09/08/2017 -  Present        Septic shock (HCC) ICD-10-CM: A41.9, R65.21  ICD-9-CM: 038.9, 785.52, 995.92  09/08/2017 - Present        Metabolic encephalopathy ICD-10-CM: G93.41  ICD-9-CM: 348.31  09/08/2017 - Present        Dehydration ICD-10-CM:  E86.0  ICD-9-CM: 276.51  08/07/2017 - Present        COPD with acute exacerbation (HCC) ICD-10-CM: J44.1  ICD-9-CM: 491.21  08/07/2017 - Present        Acute-on-chronic kidney injury (HCC) ICD-10-CM: N17.9, N18.9  ICD-9-CM: 584.9, 585.9  08/07/2017 - Present        Chest pain ICD-10-CM: R07.9  ICD-9-CM: 786.50  06/15/2017 - Present        Dyspnea ICD-10-CM: R06.00  ICD-9-CM: 786.09  06/15/2017 - Present        Type 2 diabetes mellitus with diabetic neuropathy (HCC) ICD-10-CM: E11.40  ICD-9-CM: 250.60, 357.2  12/07/2016 - Present        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, 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9  ICD-9-CM: 038.9, 995.91  07/24/2016 - Present        Asthma exacerbation ICD-10-CM: J45.901  ICD-9-CM: 493.92  07/24/2016 - Present        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: E11.21  ICD-9-CM: 250.40, 583.81  06/12/2016 - Present        Sacroiliitis (HCC) ICD-10-CM: M46.1  ICD-9-CM: 720.2  11/25/2015 - Present        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  ICD-9-CM: 721.3  09/15/2015 - Present        Lumbar and sacral osteoarthritis ICD-10-CM: M47.817  ICD-9-CM: 721.3  09/15/2015 - Present        Chronic pain syndrome ICD-10-CM: G89.4  ICD-9-CM: 338.4  09/15/2015 - Present        Type 2 diabetes mellitus with hyperglycemia, without long-term current use of insulin (HCC) ICD-10-CM: E11.65  ICD-9-CM:  250.00, 790.29  08/20/2015 - Present        Acute colitis ICD-10-CM: K52.9  ICD-9-CM: 558.9  07/02/2015 - Present        Acute hyperglycemia ICD-10-CM: R73.9  ICD-9-CM: 790.29  07/02/2015 - Present        Accelerated hypertension ICD-10-CM: I10  ICD-9-CM: 401.0  04/08/2015 - Present        Severe headache ICD-10-CM: R51.9  ICD-9-CM: 784.0  04/07/2015 - Present        Osteoarthritis of hips, 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  ICD-9-CM: 715.95  01/29/2015 - Present        PTSD (post-traumatic stress disorder) (Chronic) ICD-10-CM: F43.10  ICD-9-CM: 309.81  01/29/2015 - Present        Severe hypertension (Chronic) ICD-10-CM: I10  ICD-9-CM: 401.9  07/21/2014 - Present  RESOLVED: COPD exacerbation (HCC) ICD-10-CM: J44.1  ICD-9-CM: 491.21  11/14/2017 - 08/08/2018        RESOLVED: Essential hypertension ICD-10-CM: I10  ICD-9-CM: 401.9  10/06/2015 - 01/14/2020        RESOLVED: New onset type 1 diabetes mellitus, uncontrolled (HCC) ICD-10-CM: E10.65  ICD-9-CM: 250.03  07/02/2015 - 10/06/2015        RESOLVED: Recurrent major depressive disorder, in full remission (HCC) ICD-10-CM: F33.42  ICD-9-CM: 296.36  12/02/2014 - 12/02/2014        RESOLVED: Foreign body in colon ICD-10-CM: T18.4XXA  ICD-9-CM: 936  08/14/2014 - 01/29/2015        RESOLVED: Advanced care planning/counseling discussion ICD-10-CM: Z71.89  ICD-9-CM: V65.49  07/21/2014 - 01/29/2015    Overview Signed 07/21/2014  3:46 PM by Littie Deedsharakan, Jolson K     Patient given [place] application               RESOLVED: Hip pain, chronic ICD-10-CM: M25.559, G89.29  ICD-9-CM: 719.45, 338.29  07/21/2014 - 01/29/2015               Medications reviewed  Current Facility-Administered Medications   Medication Dose Route Frequency   ??? insulin glargine (LANTUS) injection 40 Units  40 Units SubCUTAneous QHS   ??? insulin lispro (HUMALOG) injection 30 Units  30 Units SubCUTAneous TIDAC   ??? [START ON 06/02/2020] predniSONE (DELTASONE) tablet 20 mg  20 mg Oral DAILY WITH BREAKFAST   ??? traMADoL (ULTRAM) tablet 50  mg  50 mg Oral Q6H PRN   ??? albuterol-ipratropium (DUO-NEB) 2.5 MG-0.5 MG/3 ML  3 mL Nebulization Q4H PRN   ??? predniSONE (DELTASONE) tablet 30 mg  30 mg Oral DAILY WITH BREAKFAST   ??? QUEtiapine SR (SEROquel XR) tablet 150 mg  150 mg Oral QHS PRN   ??? furosemide (LASIX) tablet 20 mg  20 mg Oral DAILY   ??? cetirizine (ZYRTEC) tablet 10 mg  10 mg Oral DAILY   ??? fluticasone propionate (FLONASE) 50 mcg/actuation nasal spray 2 Spray  2 Spray Both Nostrils DAILY PRN   ??? potassium chloride (K-DUR, KLOR-CON M20) SR tablet 20 mEq  20 mEq Oral DAILY   ??? butalbital-acetaminophen-caffeine (FIORICET, ESGIC) 50-325-40 mg per tablet 1 Tablet  1 Tablet Oral Q6H PRN   ??? melatonin tablet 6 mg  6 mg Oral QHS PRN   ??? allopurinoL (ZYLOPRIM) tablet 100 mg  100 mg Oral BID   ??? amLODIPine (NORVASC) tablet 5 mg  5 mg Oral DAILY   ??? dilTIAZem ER (CARDIZEM CD) capsule 240 mg  240 mg Oral DAILY   ??? fluticasone-vilanterol (BREO ELLIPTA) 15300mcg-25mcg/puff  1 Puff Inhalation DAILY   ??? gabapentin (NEURONTIN) capsule 300 mg  300 mg Oral TID   ??? montelukast (SINGULAIR) tablet 10 mg  10 mg Oral DAILY   ??? losartan (COZAAR) tablet 50 mg  50 mg Oral DAILY   ??? rosuvastatin (CRESTOR) tablet 5 mg  5 mg Oral QHS   ??? tiotropium bromide (SPIRIVA RESPIMAT) 2.5 mcg /actuation  1 Puff Inhalation DAILY   ??? naloxone (NARCAN) injection 0.1 mg  0.1 mg IntraVENous PRN   ??? acetaminophen (TYLENOL) tablet 650 mg  650 mg Oral Q4H PRN    Or   ??? acetaminophen (TYLENOL) solution 650 mg  650 mg Oral Q4H PRN    Or   ??? acetaminophen (TYLENOL) suppository 650 mg  650 mg Rectal Q4H PRN   ??? ondansetron (ZOFRAN) injection 4 mg  4 mg IntraVENous Q4H PRN   ???  heparin (porcine) injection 5,000 Units  5,000 Units SubCUTAneous Q8H   ??? dextrose (D50) infusion 5-25 g  10-50 mL IntraVENous PRN   ??? glucagon (GLUCAGEN) injection 1 mg  1 mg IntraMUSCular PRN        Care Plan discussed with: Patient/Family and Case Manager    Total time spent with patient: 23 minutes.    Scarlette Ar,  MD  May 31, 2020  13:18

## 2020-05-31 NOTE — Progress Notes (Signed)
Problem: Breathing Pattern - Ineffective  Goal: *Absence of hypoxia  Outcome: Progressing Towards Goal  Goal: *Use of effective breathing techniques  Outcome: Progressing Towards Goal  Goal: *PALLIATIVE CARE:  Alleviation of Dyspnea  Outcome: Progressing Towards Goal     Problem: Patient Education: Go to Patient Education Activity  Goal: Patient/Family Education  Outcome: Progressing Towards Goal     Problem: Falls - Risk of  Goal: *Absence of Falls  Description: Document Schmid Fall Risk and appropriate interventions in the flowsheet.  Outcome: Progressing Towards Goal  Note: Fall Risk Interventions:  Mobility Interventions: Patient to call before getting OOB    Mentation Interventions: Evaluate medications/consider consulting pharmacy,Adequate sleep, hydration, pain control,Bed/chair exit alarm    Medication Interventions: Evaluate medications/consider consulting pharmacy,Patient to call before getting OOB,Teach patient to arise slowly,Bed/chair exit alarm    Elimination Interventions: Call light in reach,Patient to call for help with toileting needs    History of Falls Interventions: Bed/chair exit alarm,Door open when patient unattended,Evaluate medications/consider consulting pharmacy         Problem: Falls - Risk of  Goal: *Absence of Falls  Description: Document [name] Fall Risk and appropriate interventions in the flowsheet.  Outcome: Progressing Towards Goal  Note: Fall Risk Interventions:  Mobility Interventions: Patient to call before getting OOB    Mentation Interventions: Evaluate medications/consider consulting pharmacy,Adequate sleep, hydration, pain control,Bed/chair exit alarm    Medication Interventions: Evaluate medications/consider consulting pharmacy,Patient to call before getting OOB,Teach patient to arise slowly,Bed/chair exit alarm    Elimination Interventions: Call light in reach,Patient to call for help with toileting needs    History of Falls Interventions: Bed/chair exit alarm,Door open  when patient unattended,Evaluate medications/consider consulting pharmacy         Problem: Patient Education: Go to Patient Education Activity  Goal: Patient/Family Education  Outcome: Progressing Towards Goal     Problem: Gas Exchange - Impaired  Goal: *Absence of hypoxia  Outcome: Progressing Towards Goal     Problem: Patient Education: Go to Patient Education Activity  Goal: Patient/Family Education  Outcome: Progressing Towards Goal

## 2020-06-01 LAB — BASIC METABOLIC PANEL
Anion Gap: 2 mmol/L — ABNORMAL LOW (ref 5–15)
BUN: 33 mg/dl — ABNORMAL HIGH (ref 7–25)
CO2: 32 mEq/L (ref 21–32)
Calcium: 9.1 mg/dl (ref 8.5–10.1)
Chloride: 101 mEq/L (ref 98–107)
Creatinine: 0.8 mg/dl (ref 0.6–1.3)
EGFR IF NonAfrican American: >60
GFR African American: >60
Glucose: 305 mg/dl — ABNORMAL HIGH (ref 74–106)
Potassium: 4.5 mEq/L (ref 3.5–5.1)
Sodium: 134 mEq/L — ABNORMAL LOW (ref 136–145)

## 2020-06-01 LAB — POCT GLUCOSE
POC Glucose: 294 mg/dL — ABNORMAL HIGH (ref 65–105)
POC Glucose: 318 mg/dL — ABNORMAL HIGH (ref 65–105)
POC Glucose: 341 mg/dL — ABNORMAL HIGH (ref 65–105)
POC Glucose: 310 mg/dL — ABNORMAL HIGH (ref 65–105)

## 2020-06-01 LAB — COVID-19: COVID-19: NEGATIVE

## 2020-06-01 LAB — COVID-19 (INPATIENT TESTING): COVID-19: NEGATIVE

## 2020-06-01 LAB — GLUCOSE, POC
Glucose (POC): 294 mg/dL — ABNORMAL HIGH (ref 65–105)
Glucose (POC): 318 mg/dL — ABNORMAL HIGH (ref 65–105)
Glucose (POC): 341 mg/dL — ABNORMAL HIGH (ref 65–105)
Glucose (POC): 310 mg/dL — ABNORMAL HIGH (ref 65–105)

## 2020-06-01 LAB — METABOLIC PANEL, BASIC
Anion gap: 2 mmol/L — ABNORMAL LOW (ref 5–15)
BUN: 33 mg/dl — ABNORMAL HIGH (ref 7–25)
CO2: 32 mEq/L (ref 21–32)
Calcium: 9.1 mg/dl (ref 8.5–10.1)
Chloride: 101 mEq/L (ref 98–107)
Creatinine: 0.8 mg/dl (ref 0.6–1.3)
GFR est AA: >60
GFR est non-AA: >60
Glucose: 305 mg/dl — ABNORMAL HIGH (ref 74–106)
Potassium: 4.5 mEq/L (ref 3.5–5.1)
Sodium: 134 mEq/L — ABNORMAL LOW (ref 136–145)

## 2020-06-01 MED FILL — QUETIAPINE SR 50 MG 24 HR TAB: 50 mg | ORAL | Qty: 3

## 2020-06-01 MED FILL — TRAMADOL 50 MG TAB: 50 mg | ORAL | Qty: 1

## 2020-06-01 MED FILL — LOSARTAN 50 MG TAB: 50 mg | ORAL | Qty: 1

## 2020-06-01 MED FILL — GABAPENTIN 300 MG CAP: 300 mg | ORAL | Qty: 1

## 2020-06-01 MED FILL — INSULIN GLARGINE 100 UNIT/ML INJECTION: 100 unit/mL | SUBCUTANEOUS | Qty: 1

## 2020-06-01 MED FILL — HEPARIN (PORCINE) 5,000 UNIT/ML IJ SOLN: 5000 unit/mL | INTRAMUSCULAR | Qty: 1

## 2020-06-01 MED FILL — ALLOPURINOL 100 MG TAB: 100 mg | ORAL | Qty: 1

## 2020-06-01 MED FILL — AMLODIPINE 5 MG TAB: 5 mg | ORAL | Qty: 1

## 2020-06-01 MED FILL — CETIRIZINE 5 MG TAB: 5 mg | ORAL | Qty: 2

## 2020-06-01 MED FILL — FUROSEMIDE 20 MG TAB: 20 mg | ORAL | Qty: 1

## 2020-06-01 MED FILL — DILTIAZEM ER 240 MG 24 HR CAP: 240 mg | ORAL | Qty: 1

## 2020-06-01 MED FILL — PREDNISONE 10 MG TAB: 10 mg | ORAL | Qty: 1

## 2020-06-01 MED FILL — MONTELUKAST 10 MG TAB: 10 mg | ORAL | Qty: 1

## 2020-06-01 MED FILL — POTASSIUM CHLORIDE SR 20 MEQ TAB, PARTICLES/CRYSTALS: 20 mEq | ORAL | Qty: 1

## 2020-06-01 MED FILL — ROSUVASTATIN 5 MG TAB: 5 mg | ORAL | Qty: 1
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
## 2020-06-01 NOTE — Progress Notes (Signed)
Hospitalist Progress Note         [facility]    Daily Progress Note: 06/01/2020    Assessment/Plan:     1. 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  Marked left upper lobe emphysema.    2. COPD Exacerbation-Improved  3. Acute on chronic diastolic CHF  -Last echo from 59/56/3875 reports EF of 63%, grade 1 diastolic dysfunction, RV size normal with preserved systolic function, insufficient TR to evaluate PASP    4. HTN  5. HLD  6. 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  -Primary Pulmonologist, Dr. 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    7. HA-Improved  8. Chronic pain  -[place] chronic use [place] Neurontin     9. Gout-chronic  10. DM2-uncontrolled with hyperglycemia  -a1c of 12.4 on 05/29/20    11. Decreased ability to ambulate  -suspect due to diabetic neuropathy  -reported as worse for several weeks  -Arterial PVL studies on 12/17 report no evidence of arterial insufficiency in the B LE.    PLAN:  -Remains on 3L N/C O2, reports use of 3L O2 at home. Respiratory status is stable.  -No pitting edema on exam, convert lasix to PO dosing for 05/29/20  -Pulm following, recommends Taper of prednisone every 3 days by , decrease to  on 12/21  -A/A nebs converted to PRN. Continue daily spiriva. Continue Breo for home advair.  -Continue zyrtec, singulair. PRN flonase.  -Continue Crestor  -Continue Norvasc, losartan, Cardizem  -Continue allopurinol  -Resumed gabapentin  -on 12/16 pt and spouse report increased skin darkening in B feet, and inability to walk for several weeks. Strength at B feet is symmetrical and full. Pulses intact. Negative arterial PVLs on 12/17, PT recommends SNF.  -BG in 300s so far today. Stopped Glucommander on 12/18.  Increased lantus to 40 units qHS, and increase  lispro to 30 units TID on 12/19.  With steroids decreasing tomorrow will continue current dose of insulin for now. Adjust insulin further as needed.    PPX: Heparin TID    Dispo:  -improved from respiratory standpoint  -Adjust Insulin for glycemic control as needed.  -PT/OT recommended SNF, CM following, awaiting authorization from [facility].     Subjective:     Pt reports feeling well overall. Eager to go home. No new complaints. Breathing feels normal. Husband at bedside.    Objective:   Physical Exam:     Visit Vitals  BP 119/75 (BP 1 Location: Right arm, BP Patient Position: Sitting)   Pulse 94   Temp 98.2 ??F (36.8 ??C)   Resp 16   Ht  (1.702 m)   Wt 96.2 kg (212 lb 1.3 oz)   SpO2 96%   BMI 33.22 kg/m??    O2 Flow Rate (L/min): 3 l/min O2 Device: Nasal cannula    Temp (24hrs), Avg:97.9 ??F (36.6 ??C), Min:97.3 ??F (36.3 ??C), Max:98.3 ??F (36.8 ??C)    12/20 0701 - 12/20 1900  In: 480 [P.O.:480]  Out: 550 [Urine:550]   12/18 1901 - 12/20 0700  In: 1810 [P.O.:1810]  Out: 1200 [Urine:1200]    General: Alert, Obese AAF, pleasant to conversation, in no acute distress, on 3L N/C O2.  CV: Regular rate and rhythm, no murmurs, rubs,  gallops  Pulm: Lungs without wheeze, rales or rhonchi. Normal work of breathing.  GI: Soft, nontender, nondistended, normal bowel sounds  Extremity: No clubbing, cyanosis, no pitting lower leg edema, CRT < 2s at fingertips  Skin: Warm, dry, with some chronic skin darkening at B feet.     Data Review:       24 Hour Results:  Recent Results (from the past 24 hour(s))   GLUCOSE, POC    Collection Time: 05/31/20  4:50 PM   Result Value Ref Range    Glucose (POC) 452 (HH) 65 - 105 mg/dL   GLUCOSE, POC    Collection Time: 05/31/20 10:06 PM   Result Value Ref Range    Glucose (POC) 294 (H) 65 - 105 mg/dL   METABOLIC PANEL, BASIC    Collection Time: 06/01/20  4:33 AM   Result Value Ref Range    Sodium 134 (L) 136 - 145 mEq/L    Potassium 4.5 3.5 - 5.1 mEq/L    Chloride 101 98 - 107 mEq/L    CO2 32 21 - 32  mEq/L    Glucose 305 (H) 74 - 106 mg/dl    BUN 33 (H) 7 - 25 mg/dl    Creatinine 0.8 0.6 - 1.3 mg/dl    GFR est AA >38.9      GFR est non-AA >60      Calcium 9.1 8.5 - 10.1 mg/dl    Anion gap 2 (L) 5 - 15 mmol/L   GLUCOSE, POC    Collection Time: 06/01/20  7:38 AM   Result Value Ref Range    Glucose (POC) 318 (H) 65 - 105 mg/dL   GLUCOSE, POC    Collection Time: 06/01/20 11:49 AM   Result Value Ref Range    Glucose (POC) 341 (H) 65 - 105 mg/dL       Problem List:  Problem List as of 06/01/2020 Date Reviewed: 10/25/19          Codes Class Noted - Resolved    UTI (urinary tract infection) ICD-10-CM: N39.0  ICD-9-CM: 599.0  06/01/2019 - Present        Hypoxia ICD-10-CM: R09.02  ICD-9-CM: 799.02  06/01/2019 - Present        COPD exacerbation (HCC) ICD-10-CM: J44.1  ICD-9-CM: 491.21  05/07/2019 - Present        Acute hypoxemic respiratory failure (HCC) ICD-10-CM: J96.01  ICD-9-CM: 518.81  05/07/2019 - Present        Respiratory failure with hypoxia (HCC) ICD-10-CM: J96.91  ICD-9-CM: 518.81  08/22/2018 - Present        Headache ICD-10-CM: R51.9  ICD-9-CM: 784.0  08/05/2018 - Present        Abdominal pain ICD-10-CM: R10.9  ICD-9-CM: 789.00  02/04/2018 - Present        SIRS (systemic inflammatory response syndrome) (HCC) ICD-10-CM: R65.10  ICD-9-CM: 995.90  10-24-17 - Present        Hyperkalemia ICD-10-CM: E87.5  ICD-9-CM: 276.7  09/08/2017 - Present        Obtunded ICD-10-CM: R40.1  ICD-9-CM: 780.09  09/08/2017 - Present        Acute renal failure (ARF) (HCC) ICD-10-CM: N17.9  ICD-9-CM: 584.9  09/08/2017 - Present        Septic shock (HCC) ICD-10-CM: A41.9, R65.21  ICD-9-CM: 038.9, 785.52, 995.92  09/08/2017 - Present        Metabolic encephalopathy ICD-10-CM: G93.41  ICD-9-CM: 348.31  09/08/2017 - Present        Dehydration ICD-10-CM:  E86.0  ICD-9-CM: 276.51  08/07/2017 - Present        COPD with acute exacerbation (HCC) ICD-10-CM: J44.1  ICD-9-CM: 491.21  08/07/2017 - Present        Acute-on-chronic kidney injury (HCC)  ICD-10-CM: N17.9, N18.9  ICD-9-CM: 584.9, 585.9  08/07/2017 - Present        Chest pain ICD-10-CM: R07.9  ICD-9-CM: 786.50  06/15/2017 - Present        Dyspnea ICD-10-CM: R06.00  ICD-9-CM: 786.09  06/15/2017 - Present        Type 2 diabetes mellitus with diabetic neuropathy (HCC) ICD-10-CM: E11.40  ICD-9-CM: 250.60, 357.2  12/07/2016 - Present        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, 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9  ICD-9-CM: 038.9, 995.91  07/24/2016 - Present        Asthma exacerbation ICD-10-CM: J45.901  ICD-9-CM: 493.92  07/24/2016 - Present        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: E11.21  ICD-9-CM: 250.40, 583.81  06/12/2016 - Present        Sacroiliitis (HCC) ICD-10-CM: M46.1  ICD-9-CM: 720.2  11/25/2015 - Present        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  ICD-9-CM: 721.3  09/15/2015 - Present        Lumbar and sacral osteoarthritis ICD-10-CM: M47.817  ICD-9-CM: 721.3  09/15/2015 - Present        Chronic pain syndrome ICD-10-CM: G89.4  ICD-9-CM: 338.4  09/15/2015 - Present        Type 2 diabetes mellitus with hyperglycemia, without long-term current use of insulin (HCC) ICD-10-CM: E11.65  ICD-9-CM: 250.00, 790.29  08/20/2015 - Present        Acute colitis ICD-10-CM: K52.9  ICD-9-CM: 558.9  07/02/2015 - Present        Acute hyperglycemia ICD-10-CM: R73.9  ICD-9-CM: 790.29  07/02/2015 -  Present        Accelerated hypertension ICD-10-CM: I10  ICD-9-CM: 401.0  04/08/2015 - Present        Severe headache ICD-10-CM: R51.9  ICD-9-CM: 784.0  04/07/2015 - Present        Osteoarthritis of hips, 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  ICD-9-CM: 715.95  01/29/2015 - Present        PTSD (post-traumatic stress disorder) (Chronic) ICD-10-CM: F43.10  ICD-9-CM: 309.81  01/29/2015 - Present        Severe hypertension (Chronic) ICD-10-CM: I10  ICD-9-CM: 401.9  07/21/2014 - Present        RESOLVED: COPD exacerbation (HCC) ICD-10-CM: J44.1  ICD-9-CM: 491.21  11/14/2017 - 08/08/2018        RESOLVED: Essential hypertension ICD-10-CM: I10  ICD-9-CM: 401.9  10/06/2015 - 01/14/2020        RESOLVED: New onset type  1 diabetes mellitus, uncontrolled (HCC) ICD-10-CM: E10.65  ICD-9-CM: 250.03  07/02/2015 - 10/06/2015        RESOLVED: Recurrent major depressive disorder, in full remission (HCC) ICD-10-CM: F33.42  ICD-9-CM: 296.36  12/02/2014 - 12/02/2014        RESOLVED: Foreign body in colon ICD-10-CM: T18.4XXA  ICD-9-CM: 936  08/14/2014 - 01/29/2015        RESOLVED: Advanced care planning/counseling discussion ICD-10-CM: Z71.89  ICD-9-CM: V65.49  07/21/2014 - 01/29/2015    Overview Signed 07/21/2014  3:46 PM by Littie Deedsharakan, Jolson K     Patient given [place] application               RESOLVED: Hip pain, chronic ICD-10-CM: M25.559, G89.29  ICD-9-CM: 719.45, 338.29  07/21/2014 - 01/29/2015               Medications reviewed  Current Facility-Administered Medications   Medication Dose Route Frequency   ??? insulin glargine (LANTUS) injection 40 Units  40 Units SubCUTAneous QHS   ??? insulin lispro (HUMALOG) injection 30 Units  30 Units SubCUTAneous TIDAC   ??? [START ON 06/02/2020] predniSONE (DELTASONE) tablet 20 mg  20 mg Oral DAILY WITH BREAKFAST   ??? traMADoL (ULTRAM) tablet 50 mg  50 mg Oral Q6H PRN   ??? albuterol-ipratropium (DUO-NEB) 2.5 MG-0.5 MG/3 ML  3 mL Nebulization Q4H PRN   ??? QUEtiapine SR (SEROquel XR) tablet 150 mg  150 mg Oral QHS PRN   ??? furosemide  (LASIX) tablet 20 mg  20 mg Oral DAILY   ??? cetirizine (ZYRTEC) tablet 10 mg  10 mg Oral DAILY   ??? fluticasone propionate (FLONASE) 50 mcg/actuation nasal spray 2 Spray  2 Spray Both Nostrils DAILY PRN   ??? potassium chloride (K-DUR, KLOR-CON M20) SR tablet 20 mEq  20 mEq Oral DAILY   ??? butalbital-acetaminophen-caffeine (FIORICET, ESGIC) 50-325-40 mg per tablet 1 Tablet  1 Tablet Oral Q6H PRN   ??? melatonin tablet 6 mg  6 mg Oral QHS PRN   ??? allopurinoL (ZYLOPRIM) tablet 100 mg  100 mg Oral BID   ??? amLODIPine (NORVASC) tablet 5 mg  5 mg Oral DAILY   ??? dilTIAZem ER (CARDIZEM CD) capsule 240 mg  240 mg Oral DAILY   ??? fluticasone-vilanterol (BREO ELLIPTA) 12500mcg-25mcg/puff  1 Puff Inhalation DAILY   ??? gabapentin (NEURONTIN) capsule 300 mg  300 mg Oral TID   ??? montelukast (SINGULAIR) tablet 10 mg  10 mg Oral DAILY   ??? losartan (COZAAR) tablet 50 mg  50 mg Oral DAILY   ??? rosuvastatin (CRESTOR) tablet 5 mg  5 mg Oral QHS   ??? tiotropium bromide (SPIRIVA RESPIMAT) 2.5 mcg /actuation  1 Puff Inhalation DAILY   ??? naloxone (NARCAN) injection 0.1 mg  0.1 mg IntraVENous PRN   ??? acetaminophen (TYLENOL) tablet 650 mg  650 mg Oral Q4H PRN    Or   ??? acetaminophen (TYLENOL) solution 650 mg  650 mg Oral Q4H PRN    Or   ??? acetaminophen (TYLENOL) suppository 650 mg  650 mg Rectal Q4H PRN   ??? ondansetron (ZOFRAN) injection 4 mg  4 mg IntraVENous Q4H PRN   ??? heparin (porcine) injection 5,000 Units  5,000 Units SubCUTAneous Q8H   ??? dextrose (D50) infusion 5-25 g  10-50 mL IntraVENous PRN   ??? glucagon (GLUCAGEN) injection 1 mg  1 mg IntraMUSCular PRN        Care Plan discussed with: Patient/Family and Case Manager    Total time  spent with patient: 20 minutes.    Scarlette Ar, MD  June 01, 2020  14:07

## 2020-06-01 NOTE — Progress Notes (Signed)
Problem: Breathing Pattern - Ineffective  Goal: *Absence of hypoxia  Outcome: Progressing Towards Goal     Problem: Gas Exchange - Impaired  Goal: *Absence of hypoxia  Outcome: Progressing Towards Goal     Problem: Pain  Goal: *Control of Pain  Outcome: Progressing Towards Goal     Problem: Diabetes Self-Management  Goal: *Disease process and treatment process  Description: Define diabetes and identify own type of diabetes; list 3 options for treating diabetes.  Outcome: Progressing Towards Goal

## 2020-06-01 NOTE — Progress Notes (Signed)
Problem: Breathing Pattern - Ineffective  Goal: *Absence of hypoxia  Outcome: Progressing Towards Goal  Patient sating 99% on RA.     Problem: Patient Education: Go to Patient Education Activity  Goal: Patient/Family Education  Outcome: Progressing Towards Goal
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
## 2020-06-01 NOTE — Progress Notes (Signed)
Discharge planning:  [employer] authorization initiated for rehab placement - [facility].   COVID screening orders and is pending.  Continue to monitor.

## 2020-06-01 NOTE — Progress Notes (Signed)
Problem: Breathing Pattern - Ineffective  Goal: *Absence of hypoxia  Outcome: Progressing Towards Goal  Goal: *Use of effective breathing techniques  Outcome: Progressing Towards Goal     Problem: Patient Education: Go to Patient Education Activity  Goal: Patient/Family Education  Outcome: Progressing Towards Goal     Problem: Falls - Risk of  Goal: *Absence of Falls  Description: Document [name] Fall Risk and appropriate interventions in the flowsheet.  Outcome: Progressing Towards Goal  Note: Fall Risk Interventions:  Mobility Interventions: Patient to call before getting OOB    Mentation Interventions: Evaluate medications/consider consulting pharmacy    Medication Interventions: Evaluate medications/consider consulting pharmacy    Elimination Interventions: Call light in reach    History of Falls Interventions: Bed/chair exit alarm         Problem: Gas Exchange - Impaired  Goal: *Absence of hypoxia  Outcome: Progressing Towards Goal     Problem: Chronic Obstructive Pulmonary Disease (COPD)  Goal: *Oxygen saturation during activity within specified parameters  Outcome: Progressing Towards Goal     Problem: Pain  Goal: *Control of Pain  Outcome: Progressing Towards Goal

## 2020-06-01 NOTE — Progress Notes (Signed)
 Pt's IV fell out for the third time this admission. Pt does not want another IV placed and only has PO medications. MD made aware. Per Dr Daneen, ok for pt to not have IV access.

## 2020-06-01 NOTE — Progress Notes (Signed)
Chaplain Initial Consultation    Start Visit: 1036  End Visit: 1040    Chaplain conducted an initial consultation and Spiritual Assessment for [name] [name], who is a 69 y.o.,female. Patient's Primary Language is: [place].   According to the patient's EMR Religious Affiliation is: [facility]     The reason the Patient came to the hospital is:   Patient Active Problem List    Diagnosis Date Noted   . UTI (urinary tract infection) 06/01/2019   . Hypoxia 06/01/2019   . COPD exacerbation (HCC) 05/07/2019   . Acute hypoxemic respiratory failure (HCC) 05/07/2019   . Respiratory failure with hypoxia (HCC) 08/22/2018   . Headache 08/05/2018   . Abdominal pain 02/04/2018   . SIRS (systemic inflammatory response syndrome) (HCC) 10/09/2017   . Hyperkalemia 09/08/2017   . Obtunded 09/08/2017   . Acute renal failure (ARF) (HCC) 09/08/2017   . Septic shock (HCC) 09/08/2017   . Metabolic encephalopathy 09/08/2017   . Dehydration 08/07/2017   . COPD with acute exacerbation (HCC) 08/07/2017   . Acute-on-chronic kidney injury (HCC) 08/07/2017   . Chest pain 06/15/2017   . Dyspnea 06/15/2017   . Type 2 diabetes mellitus with diabetic neuropathy (HCC) 12/07/2016   . 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 Cannabinoid hyperemesis syndrome 08/17/2016   . Gastritis 08/16/2016   . Nausea & vomiting 08/16/2016   . Asthma with acute exacerbation 08/04/2016   . Lactic acidosis 07/24/2016   . Leukocytosis 07/24/2016   . Sepsis (HCC) 07/24/2016   . Asthma exacerbation 07/24/2016   . Type 2 diabetes mellitus with nephropathy (HCC) 06/12/2016   . Sacroiliitis (HCC) 11/25/2015   . Spondylosis of lumbar region without myelopathy or radiculopathy 09/15/2015   . Lumbar and sacral osteoarthritis 09/15/2015   . Chronic pain syndrome 09/15/2015   . Type 2 diabetes mellitus with hyperglycemia, without long-term current use of insulin (HCC) 08/20/2015   . Acute colitis 07/02/2015   . Acute hyperglycemia 07/02/2015   . Accelerated hypertension 04/08/2015    . Severe headache 04/07/2015   . Osteoarthritis of hips, 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   . PTSD (post-traumatic stress disorder) 01/29/2015   . Severe hypertension 07/21/2014        The Chaplain provided the following Interventions:  Initiated a relationship of care and support.   Offered prayer and assurance of continued prayers on patient's behalf.   Chart reviewed.    The following outcomes where achieved:  Patient expressed gratitude for chaplain's visit.    Assessment:  Patient does not have any religious/cultural needs that will affect patient's preferences in health care.  There are no spiritual or religious issues which require intervention at this time.     Plan:  Chaplains will continue to follow and will provide pastoral care on an as needed/requested basis.  Chaplain charted for volunteer chaplain visit.    Nona Dell.   Pharmacologist Care  [phone]

## 2020-06-02 LAB — POCT GLUCOSE
POC Glucose: 317 mg/dL — ABNORMAL HIGH (ref 65–105)
POC Glucose: 285 mg/dL — ABNORMAL HIGH (ref 65–105)
POC Glucose: 350 mg/dL — ABNORMAL HIGH (ref 65–105)

## 2020-06-02 LAB — GLUCOSE, POC
Glucose (POC): 317 mg/dL — ABNORMAL HIGH (ref 65–105)
Glucose (POC): 285 mg/dL — ABNORMAL HIGH (ref 65–105)
Glucose (POC): 350 mg/dL — ABNORMAL HIGH (ref 65–105)

## 2020-06-02 MED ORDER — FLUTICASONE 50 MCG/ACTUATION NASAL SPRAY, SUSP
50 mcg/actuation | Freq: Every day | NASAL | 0 refills | Status: AC | PRN
Start: 2020-06-02 — End: 2021-05-23

## 2020-06-02 MED ORDER — GLYCERIN 99.5 % TOPICAL SOLN
99.5 % | Freq: Once | CUTANEOUS | Status: AC
Start: 2020-06-02 — End: 2020-06-02
  Administered 2020-06-02: 17:00:00 via RECTAL

## 2020-06-02 MED ORDER — POLYETHYLENE GLYCOL 3350 17 GRAM (100 %) ORAL POWDER PACKET
17 gram | PACK | Freq: Every day | ORAL | 1 refills | Status: DC | PRN
Start: 2020-06-02 — End: 2021-05-23

## 2020-06-02 MED ORDER — GABAPENTIN 300 MG CAP
300 mg | ORAL_CAPSULE | Freq: Three times a day (TID) | ORAL | 0 refills | Status: AC
Start: 2020-06-02 — End: 2020-07-02

## 2020-06-02 MED ORDER — LANTUS SOLOSTAR U-100 INSULIN 100 UNIT/ML (3 ML) SUBCUTANEOUS PEN
100 unit/mL (3 mL) | Freq: Every evening | SUBCUTANEOUS | 0 refills | Status: AC
Start: 2020-06-02 — End: 2020-07-02

## 2020-06-02 MED ORDER — PREDNISONE 10 MG TAB
10 mg | ORAL_TABLET | ORAL | 0 refills | Status: DC
Start: 2020-06-02 — End: 2021-01-24

## 2020-06-02 MED ORDER — AMLODIPINE 5 MG TAB
5 mg | ORAL_TABLET | Freq: Every day | ORAL | 0 refills | Status: AC
Start: 2020-06-02 — End: 2020-07-02

## 2020-06-02 MED ORDER — TRAMADOL 50 MG TAB
50 mg | ORAL_TABLET | Freq: Four times a day (QID) | ORAL | 0 refills | Status: AC | PRN
Start: 2020-06-02 — End: 2020-07-02

## 2020-06-02 MED ORDER — INSULIN LISPRO 100 UNIT/ML INJECTION
100 unit/mL | Freq: Three times a day (TID) | SUBCUTANEOUS | 0 refills | Status: AC
Start: 2020-06-02 — End: 2020-07-02

## 2020-06-02 MED ORDER — FUROSEMIDE 20 MG TAB
20 mg | ORAL_TABLET | Freq: Every day | ORAL | 0 refills | Status: DC
Start: 2020-06-02 — End: 2021-01-28

## 2020-06-02 MED FILL — GLYCERIN 99.5 % TOPICAL SOLN: 99.5 % | CUTANEOUS | Qty: 125

## 2020-06-02 MED FILL — LOSARTAN 50 MG TAB: 50 mg | ORAL | Qty: 1

## 2020-06-02 MED FILL — ALLOPURINOL 100 MG TAB: 100 mg | ORAL | Qty: 1

## 2020-06-02 MED FILL — ROSUVASTATIN 5 MG TAB: 5 mg | ORAL | Qty: 1

## 2020-06-02 MED FILL — POTASSIUM CHLORIDE SR 20 MEQ TAB, PARTICLES/CRYSTALS: 20 mEq | ORAL | Qty: 1

## 2020-06-02 MED FILL — MONTELUKAST 10 MG TAB: 10 mg | ORAL | Qty: 1

## 2020-06-02 MED FILL — QUETIAPINE SR 50 MG 24 HR TAB: 50 mg | ORAL | Qty: 3

## 2020-06-02 MED FILL — DILTIAZEM ER 240 MG 24 HR CAP: 240 mg | ORAL | Qty: 1

## 2020-06-02 MED FILL — GABAPENTIN 300 MG CAP: 300 mg | ORAL | Qty: 1

## 2020-06-02 MED FILL — PREDNISONE 20 MG TAB: 20 mg | ORAL | Qty: 1

## 2020-06-02 MED FILL — IPRATROPIUM-ALBUTEROL 2.5 MG-0.5 MG/3 ML NEB SOLUTION: 2.5 mg-0.5 mg/3 ml | RESPIRATORY_TRACT | Qty: 3

## 2020-06-02 MED FILL — HEPARIN (PORCINE) 5,000 UNIT/ML IJ SOLN: 5000 unit/mL | INTRAMUSCULAR | Qty: 1

## 2020-06-02 MED FILL — CETIRIZINE 5 MG TAB: 5 mg | ORAL | Qty: 2

## 2020-06-02 MED FILL — FUROSEMIDE 20 MG TAB: 20 mg | ORAL | Qty: 1

## 2020-06-02 NOTE — Progress Notes (Signed)
Gave report to [facility] nurse. Report consisted of SBAR, MAR, I/O and recent results. Opportunity for questions and clarification was given.

## 2020-06-02 NOTE — Progress Notes (Signed)
Patient discharged to rehab [facility]).  Patient, family, and facility notified.  Medical transport tentatively set for 1330.      Discharge Plan:   Rehab    Discharge Date:     06/02/2020     Assisted Living Facility:    N/A    Is patient going to snf?:  Yes  Name:  [facility]    Does patient meet [employer] qualifications or exemptions:  Pending    Exempt r/t over resource or denied:  Pending    Is [employer] complete:  Pending  By who:  Pending    Any other equipment needs at snf?:  Yes - oxygen    Home Health Needed:     No    DME needed and ordered for Discharge:    None    TCC Referral:     No    Medication Assistance given    No       meds given (please list)     None    Change(MDC/SSDI) Referral/outcome    N/A     Transportation: IT sales professional    Transport Time:    1330    Family, MD, patient, nurse and facility aware of pickup time    Yes

## 2020-06-02 NOTE — Progress Notes (Signed)
Transport to: [facility]  Reason for transport: COPD/ CHF exacerbation, bed bound due to severe diabetic neuropathy  Transport set up with:[facility]  Time/Date:06/02/20@1300   D/C Summary loaded: yes  Nurse/CM notified: yes  Envelope delivered: yes  Insurance verified on face sheet: yes  Auth needed: no  Auth #:

## 2020-06-02 NOTE — Discharge Summary (Signed)
Hospitalist Discharge Summary      Discharge Summary   Admit Date: 05/26/2020  Discharge Date:  06/02/20      Patient ID:  [name] [name]  69 y.o.  06/08/1951    Chief Complaint   Patient presents with   ??? Respiratory Distress       Discharge Diagnoses  1. 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  Marked left upper lobe emphysema.  ??  2. COPD Exacerbation-Improved  -Continue steroid taper with prednisone 20 mg for 2 more days, and then decrease to 10 mg for 3 days starting on 06/05/2020.    3. Acute on chronic diastolic CHF  -Last echo from 16/10/960412/23/2020 reports EF of 63%, grade 1 diastolic dysfunction, RV size normal with preserved systolic function, insufficient TR to evaluate PASP  ??  4. HTN  5. HLD  6. 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  -Primary Pulmonologist, Dr. 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  ??  7. HA-Improved  8. Chronic pain  -[facility] [place] use of Neurontin 300mg -refilled at discharge  -added tramadol 50mg  q6hr prn for use at SNF for pain.  ??  9. Gout-chronic  10. DM2-uncontrolled with hyperglycemia  -a1c of 12.4 on 05/29/20  -Blood sugar has been poorly controlled in the hospital, while steroids may have worsened this acutely given her elevated A1c suspect there is also a chronic component to this.  -Have increased Lantus to 40 units nightly and will discharge with lispro at 35 units 3 times daily with meals.    -Follow-up with primary care and adjust further as needed.  -Blood sugar may improve somewhat after being off of steroids but again, do not think steroids account for her A1c elevation.  ??  11. Decreased ability to ambulate  -suspect due to diabetic neuropathy  -reported as worse for several weeks  -Arterial PVL studies on 12/17 report no evidence of arterial insufficiency  in the B LE.    Current Discharge Medication List      START taking these medications    Details   insulin lispro (HUMALOG) 100 unit/mL injection 35 Units by SubCUTAneous route three (3) times daily (with meals) for 30 days. Indications: type 2 diabetes mellitus  Qty: 31.5 mL, Refills: 0  Start date: 06/02/2020, End date: 07/02/2020      traMADoL (ULTRAM) 50 mg tablet Take 1 Tablet by mouth every six (6) hours as needed for Pain for up to 30 days. Max Daily Amount: 200 mg. Printed Rx for use at SNF  Indications: neuropathic pain  Qty: 90 Tablet, Refills: 0  Start date: 06/02/2020, End date: 07/02/2020    Associated Diagnoses: Type 2 diabetes mellitus with diabetic neuropathy, without long-term current use of insulin (HCC)      predniSONE (DELTASONE) 10 mg tablet Take 2 tablets daily from 12/22-12/23, then decrease to 1 tablet daily for 3 days from 12/24-12/26, then stop.  Indications: worsening chronic obstructive pulmonary disease  Qty: 7 Tablet, Refills: 0  Start date: 06/02/2020         CONTINUE these medications which have CHANGED    Details   polyethylene glycol (MIRALAX) 17 gram packet Take 1 Packet by mouth daily as needed for Constipation. Indications: constipation  Qty: 30 Packet, Refills: 1  Start date: 06/02/2020      fluticasone propionate (FLONASE) 50 mcg/actuation nasal spray 2 Sprays by Both Nostrils route daily as needed for Rhinitis or Allergies. Indications: inflammation of the nose due to an allergy  Qty: 1 Each, Refills: 0  Start date: 06/02/2020      amLODIPine (NORVASC) 5 mg tablet Take 1 Tablet by mouth daily for 30 days. Indications: high blood pressure  Qty: 30 Tablet, Refills: 0  Start date: 06/02/2020, End date: 07/02/2020      furosemide (LASIX) 20 mg tablet Take 1 Tablet by mouth daily. Indications: fluid in the lungs due to chronic heart failure  Qty: 30 Tablet, Refills: 0  Start date: 06/02/2020      gabapentin (NEURONTIN) 300 mg capsule Take 1 Capsule by mouth three (3) times daily for  30 days. Max Daily Amount: 900 mg. Indications: neuropathic pain  Qty: 90 Capsule, Refills: 0  Start date: 06/02/2020, End date: 07/02/2020    Associated Diagnoses: Chronic right hip pain      insulin glargine (Lantus Solostar U-100 Insulin) 100 unit/mL (3 mL) inpn 40 Units by SubCUTAneous route nightly for 30 days. Indications: type 2 diabetes mellitus  Qty: 12 mL, Refills: 0  Start date: 06/02/2020, End date: 07/02/2020    Associated Diagnoses: Type 2 diabetes mellitus with diabetic neuropathy, without long-term current use of insulin (HCC)         CONTINUE these medications which have NOT CHANGED    Details   allopurinoL (ZYLOPRIM) 100 mg tablet Take 1 tablet by mouth twice daily  Qty: 60 Tablet, Refills: 0    Associated Diagnoses: Gout, unspecified cause, unspecified chronicity, unspecified site      butalbital-acetaminophen-caffeine (FIORICET, ESGIC) 50-325-40 mg per tablet butalbital-acetaminophen-caffeine 50 mg-325 mg-40 mg tablet   TAKE 1 TABLET BY MOUTH EVERY 4 HOURS AS NEEDED FOR HEADACHE      potassium chloride SR (K-TAB) 20 mEq tablet potassium chloride ER 20 mEq tablet,extended release   TAKE 1 TABLET BY MOUTH ONCE DAILY      albuterol (PROVENTIL HFA, VENTOLIN HFA, PROAIR HFA) 90 mcg/actuation inhaler Take 2 Puffs by inhalation every four (4) hours as needed for Wheezing.  Qty: 1 Inhaler, Refills: 1      dilTIAZem CD (CARDIZEM CD) 240 mg ER capsule Take 1 Cap by mouth daily.  Qty: 30 Cap, Refills: 1      losartan (COZAAR) 50 mg tablet Take 1 Tab by mouth daily.  Qty: 30 Tab, Refills: 1      cetirizine (ZYRTEC) 10 mg tablet Take 1 Tab by mouth daily.  Qty: 30 Tab, Refills: 1      albuterol (ACCUNEB) 1.25 mg/3 mL nebu USE 1 VIAL IN NEBULIZER EVERY 6 HOURS AS NEEDED FOR  SHORTNESS  OF  BREATH,  WHEEZING  Qty: 270 mL, Refills: 1      cloNIDine HCl (CATAPRES) 0.1 mg tablet Take 3 Tabs by mouth two (2) times a day.  Qty: 60 Tab, Refills: 1      metFORMIN ER (GLUCOPHAGE XR) 500 mg tablet TAKE 1 TABLET TWICE  DAILY  Qty: 180 Tab, Refills: 1      montelukast (SINGULAIR) 10 mg tablet TAKE 1 TABLET EVERY DAY  Qty: 90 Tab, Refills: 3      magnesium oxide (MAG-OX) 400 mg tablet Take 400 mg by mouth daily.      QUEtiapine SR (SEROQUEL XR) 150 mg sr tablet Take 150 mg by mouth nightly as needed.      tiotropium  bromide (SPIRIVA RESPIMAT) 2.5 mcg/actuation inhaler Take 1 Puff by inhalation daily.  Qty: 1 Inhaler, Refills: 0    Associated Diagnoses: COPD with acute exacerbation (HCC)      fluticasone propion-salmeterol (ADVAIR DISKUS) 100-50 mcg/dose diskus inhaler Take 1 Puff by inhalation two (2) times a day.  Qty: 1 Inhaler, Refills: 0      rosuvastatin (CRESTOR) 5 mg tablet TAKE 1 TABLET BY MOUTH NIGHTLY  Qty: 90 Tab, Refills: 1    Associated Diagnoses: Type 2 diabetes mellitus with diabetic neuropathy, without long-term current use of insulin (HCC)      acetaminophen (TYLENOL) 325 mg tablet Take 2 Tabs by mouth every four (4) hours as needed for Pain.  Qty: 20 Tab, Refills: 0         STOP taking these medications       guaiFENesin ER (MUCINEX) 600 mg ER tablet Comments:   Reason for Stopping:         methocarbamoL (Robaxin) 500 mg tablet Comments:   Reason for Stopping:         ibuprofen (MOTRIN) 600 mg tablet Comments:   Reason for Stopping:                 Initial presentation:  From HPI:  Marella is a 69 y.o. BLACK/[place] female who presents with respiratory distress  Patient has history of diabetes, hypertension COPD and chronic respiratory failure came in with respiratory distress, which has been worse with exertion  She also has been having nonproductive cough no chest pain  Patient also noted to have left upper and lower extremity swelling for the past few days, has been having difficulty ambulating for a while  When patient arrived admission oxygen saturation was in the 80s, patient was placed on 3 L nasal cannula    Patient was mated to the stepdown unit with plans for BiPAP support, noted element of  chronic respiratory failure with CO2 retention, also plan for IV steroids, Glucomander, otherwise continue her medications were possible but to consult pulmonology/critical care medicine.    Hospital Course:   Pulmonology evaluated on the 14th and agreed with BiPAP, steroid treatment and nebulizations, to continue her medications for allergic rhinitis, started IV Lasix with history of diastolic CHF.  When seen the next day was able to wean off of BiPAP to 2-3 L nasal cannula O2.  Pulmonology narrowed IV steroids to prednisone with plans for taper.  Recommend continue nebulization treatments and IV Lasix.  Otherwise felt patient was improved and plan for transfer to telemetry.  Pulmonology reassessed on the 16th and recommended to continue prednisone at 40 mg and taper by 10 mg every 3 days.  At this point patient mains stable on 3 L of O2.  BiPAP use was converted to as needed but was not required after this point.  Plans made to convert Lasix to p.o. dosing for 12/17.  She was continued on Breo for home Advair.  Plans made to resume gabapentin.  On that date she and her spouse both reported that she had difficulty walking for several weeks prior to admission.  Plans made to consult therapy services also check arterial PVLs which showed no significant peripheral arterial disease.  Glucose was noted to be uncontrolled and plans made to adjust Glucomander if remained uncontrolled off of IV steroids and check A1c.  Patient is felt to be improved from a respiratory standpoint of the 17th.  Therapy services evaluated recommending SNF.  Blood sugar remained uncontrolled and  Glucomander was adjusted on the 17th but then stopped on the 18th with continued uncontrolled blood sugars and converted to manual dosing of Lantus and lispro which were gradually titrated up.  Blood sugar remains poorly controlled in the 300s but improved from prior values of 4 and 500.  Steroids have been tapered.  Respiratory status otherwise  remained the same.  Suspect weakness relates to neuropathy given her chronic diabetes and patient is plan for discharge to SNF to continue therapy needs.  At this point patient reports some constipation and will plan for enema prior to discharge but she otherwise appears stable and plan for discharge to SNF today.  Continue steroid taper and adjust insulin doses if needed.  Refilled home prescription of gabapentin for use at SNF and tramadol have been added.  During her stay and will add this to continue for as needed use for pain at discharge    Consultants:   Dr. Faythe Ghee and colleagues of Pulm/Critical care.    Imaging:  XR CHEST PA LAT    Result Date: 05/26/2020  Exam: PA lateral chest Clinical indication: SOB Comparison: 08/22/2019;  Results:  Decreasing left mid chest nodularity, 2.2 x 1.8 cm. Previously 7 x 4 cm. No pneumothorax. Marked left upper lobe emphysema. Heart mildly enlarged.  Mediastinal contours are normal. No free air is seen under the hemidiaphragms.   Osseous structures intact.     IMPRESSION:  Decreasing left mid chest nodularity, 2.2 x 1.8 cm. Previously 7 x 4 cm. Marked left upper lobe emphysema.         Arterial Lower Extremity Report on 05/29/20  ??  Name: MELVA, FAUX          Study Date: 05/29/2020 10:03 AM  MRN: [id number]                    Patient Location: [id number]  DOB: 09/27/1950                Age: 60 yrs  Gender: Female                 Account #: [id number]  Reason For Study: Leg weakness and pain. ICD-10 M62.81 M79.606  Ordering Physician: Scarlette Ar  ??  Performed By: Kathrin Ruddy, RVT  ??  Interpretation Summary  No evidence of arterial insufficiency in the bilateral lower extremities.      Physical Exam on Discharge:  Visit Vitals  BP (!) 158/100 (BP 1 Location: Left arm, BP Patient Position: Supine)   Pulse 89   Temp 98.3 ??F (36.8 ??C)   Resp 17   Ht 5\' 7"  (1.702 m)   Wt 96.2 kg (212 lb 1.3 oz)   SpO2 99%   BMI 33.22 kg/m??     General: Alert, Obese AAF,  pleasant to conversation, in no acute distress, on 3L N/C O2.  CV: Regular rate and rhythm, no murmurs, rubs, gallops  Pulm: Lungs without wheeze, rales or rhonchi. Normal work of breathing.  GI: Soft, nontender, obese, normal bowel sounds  Extremity: No clubbing, cyanosis, no pitting lower leg edema, CRT < 2s at fingertips  Skin: Warm, dry, with some chronic skin darkening at B feet.         Most Recent BMP and CBC:    Lab Results   Component Value Date/Time    Sodium 134 (L) 06/01/2020 04:33 AM    Potassium 4.5 06/01/2020 04:33 AM    Chloride 101 06/01/2020 04:33 AM  CO2 32 06/01/2020 04:33 AM    Anion gap 2 (L) 06/01/2020 04:33 AM    Glucose 305 (H) 06/01/2020 04:33 AM    BUN 33 (H) 06/01/2020 04:33 AM    Creatinine 0.8 06/01/2020 04:33 AM    BUN/Creatinine ratio 10 (L) 09/20/2017 04:05 PM    GFR est AA >60.0 06/01/2020 04:33 AM    GFR est non-AA >60 06/01/2020 04:33 AM    Calcium 9.1 06/01/2020 04:33 AM      Lab Results   Component Value Date/Time    WBC 12.1 (H) 05/27/2020 12:19 AM    Hemoglobin (POC) 11.8 12/16/2014 02:13 PM    HGB 13.3 05/27/2020 12:19 AM    HCT 42.1 05/27/2020 12:19 AM    PLATELET 222 05/27/2020 12:19 AM    MCV 84.9 05/27/2020 12:19 AM          Condition at discharge: Stable.     Disposition:  SNF      PCP:  [name], PA-C, f/up after discharge from SNF    F/up with Pulm, Dr. Darrol Poke in 3-4 weeks    Total time for DC was 40 minutes.      Scarlette Ar, MD  June 02, 2020  10:43

## 2020-06-02 NOTE — Progress Notes (Signed)
Discharge planning:  [facility] authorization received Fransico Him #[id number] [facility] authorization [id number] - case manager - Stephanie Bowel [phone] - authorized for 3 days - 06/01/2020 to 06/03/2020.  Continue to monitor.

## 2020-06-19 ENCOUNTER — Encounter: Payer: MEDICARE | Attending: Orthopaedic Surgery | Primary: Physician Assistant

## 2020-06-24 ENCOUNTER — Encounter

## 2020-06-24 MED ORDER — ALLOPURINOL 100 MG TAB
100 mg | ORAL_TABLET | ORAL | 0 refills | Status: DC
Start: 2020-06-24 — End: 2020-07-28

## 2020-06-24 NOTE — Telephone Encounter (Signed)
Call pt for rx pick up

## 2020-06-30 DIAGNOSIS — E1140 Type 2 diabetes mellitus with diabetic neuropathy, unspecified: Secondary | ICD-10-CM

## 2020-07-01 ENCOUNTER — Inpatient Hospital Stay: Admit: 2020-07-01 | Payer: MEDICARE | Primary: Physician Assistant

## 2020-07-01 LAB — CBC WITH AUTO DIFFERENTIAL
Basophils %: 0.3 % (ref 0–3)
Eosinophils %: 0.9 % (ref 0–5)
Hematocrit: 42.3 % (ref 37.0–50.0)
Hemoglobin: 13.2 gm/dl (ref 13.0–17.2)
Immature Granulocytes: 0.5 % (ref 0.0–3.0)
Lymphocytes %: 13 % — ABNORMAL LOW (ref 28–48)
MCH: 27.5 pg (ref 25.4–34.6)
MCHC: 31.2 gm/dl (ref 30.0–36.0)
MCV: 88.1 fL (ref 80.0–98.0)
MPV: 13.3 fL — ABNORMAL HIGH (ref 6.0–10.0)
Monocytes %: 6 % (ref 1–13)
Neutrophils %: 79.3 % — ABNORMAL HIGH (ref 34–64)
Nucleated RBCs: 0 (ref 0–0)
Platelets: 201 10*3/uL (ref 140–450)
RBC: 4.8 M/uL (ref 3.60–5.20)
RDW-SD: 48.2 — ABNORMAL HIGH (ref 36.4–46.3)
WBC: 12.9 10*3/uL — ABNORMAL HIGH (ref 4.0–11.0)

## 2020-07-01 LAB — COMPREHENSIVE METABOLIC PANEL
ALT: 14 U/L (ref 12–78)
AST: 7 U/L — ABNORMAL LOW (ref 15–37)
Albumin: 3.6 gm/dl (ref 3.4–5.0)
Alkaline Phosphatase: 133 U/L — ABNORMAL HIGH (ref 45–117)
Anion Gap: 2 mmol/L — ABNORMAL LOW (ref 5–15)
BUN: 11 mg/dl (ref 7–25)
CO2: 37 mEq/L — ABNORMAL HIGH (ref 21–32)
Calcium: 9.1 mg/dl (ref 8.5–10.1)
Chloride: 97 mEq/L — ABNORMAL LOW (ref 98–107)
Creatinine: 1 mg/dl (ref 0.6–1.3)
EGFR IF NonAfrican American: 59
GFR African American: >60
Glucose: 445 mg/dl (ref 74–106)
Potassium: 4.2 mEq/L (ref 3.5–5.1)
Sodium: 136 mEq/L (ref 136–145)
Total Bilirubin: 0.5 mg/dl (ref 0.2–1.0)
Total Protein: 7.5 gm/dl (ref 6.4–8.2)

## 2020-07-01 LAB — LIPID PANEL
CHOL/HDL Ratio: 3.1 Ratio (ref 0.0–4.4)
Chol/HDL Ratio: 3.1 Ratio (ref 0.0–4.4)
Cholesterol, Total: 119 mg/dl — ABNORMAL LOW (ref 140–199)
Cholesterol, total: 119 mg/dl — ABNORMAL LOW (ref 140–199)
HDL Cholesterol: 39 mg/dl — ABNORMAL LOW (ref 40–96)
HDL: 39 mg/dl — ABNORMAL LOW (ref 40–96)
LDL Calculated: 39 mg/dl (ref 0–130)
LDL, calculated: 39 mg/dl (ref 0–130)
Triglyceride: 203 mg/dl — ABNORMAL HIGH (ref 29–150)
Triglycerides: 203 mg/dl — ABNORMAL HIGH (ref 29–150)

## 2020-07-01 LAB — HEMOGLOBIN A1C W/O EAG
Hemoglobin A1C: 13.3 % — ABNORMAL HIGH (ref 4.2–5.6)
Hemoglobin A1c: 13.3 % — ABNORMAL HIGH (ref 4.2–5.6)

## 2020-07-01 LAB — METABOLIC PANEL, COMPREHENSIVE
ALT (SGPT): 14 U/L (ref 12–78)
AST (SGOT): 7 U/L — ABNORMAL LOW (ref 15–37)
Albumin: 3.6 gm/dl (ref 3.4–5.0)
Alk. phosphatase: 133 U/L — ABNORMAL HIGH (ref 45–117)
Anion gap: 2 mmol/L — ABNORMAL LOW (ref 5–15)
BUN: 11 mg/dl (ref 7–25)
Bilirubin, total: 0.5 mg/dl (ref 0.2–1.0)
CO2: 37 mEq/L — ABNORMAL HIGH (ref 21–32)
Calcium: 9.1 mg/dl (ref 8.5–10.1)
Chloride: 97 mEq/L — ABNORMAL LOW (ref 98–107)
Creatinine: 1 mg/dl (ref 0.6–1.3)
GFR est AA: >60
GFR est non-AA: 59
Glucose: 445 mg/dl — CR (ref 74–106)
Potassium: 4.2 mEq/L (ref 3.5–5.1)
Protein, total: 7.5 gm/dl (ref 6.4–8.2)
Sodium: 136 mEq/L (ref 136–145)

## 2020-07-01 LAB — CBC WITH AUTOMATED DIFF
BASOPHILS: 0.3 % (ref 0–3)
EOSINOPHILS: 0.9 % (ref 0–5)
HCT: 42.3 % (ref 37.0–50.0)
HGB: 13.2 gm/dl (ref 13.0–17.2)
IMMATURE GRANULOCYTES: 0.5 % (ref 0.0–3.0)
LYMPHOCYTES: 13 % — ABNORMAL LOW (ref 28–48)
MCH: 27.5 pg (ref 25.4–34.6)
MCHC: 31.2 gm/dl (ref 30.0–36.0)
MCV: 88.1 fL (ref 80.0–98.0)
MONOCYTES: 6 % (ref 1–13)
MPV: 13.3 fL — ABNORMAL HIGH (ref 6.0–10.0)
NEUTROPHILS: 79.3 % — ABNORMAL HIGH (ref 34–64)
NRBC: 0 (ref 0–0)
PLATELET: 201 10*3/uL (ref 140–450)
RBC: 4.8 M/uL (ref 3.60–5.20)
RDW-SD: 48.2 — ABNORMAL HIGH (ref 36.4–46.3)
WBC: 12.9 10*3/uL — ABNORMAL HIGH (ref 4.0–11.0)

## 2020-07-28 ENCOUNTER — Encounter

## 2020-07-28 NOTE — Telephone Encounter (Signed)
Call pt for rx pick up

## 2020-07-29 MED ORDER — ALLOPURINOL 100 MG TAB
100 mg | ORAL_TABLET | ORAL | 0 refills | Status: DC
Start: 2020-07-29 — End: 2020-09-03

## 2020-09-02 ENCOUNTER — Encounter

## 2020-09-03 MED ORDER — ALLOPURINOL 100 MG TAB
100 mg | ORAL_TABLET | ORAL | 0 refills | Status: DC
Start: 2020-09-03 — End: 2020-10-07

## 2020-09-03 NOTE — Telephone Encounter (Signed)
Call pt for rx pick up

## 2020-09-10 ENCOUNTER — Encounter: Payer: MEDICARE | Attending: Orthopaedic Surgery | Primary: Physician Assistant

## 2020-09-14 ENCOUNTER — Encounter

## 2020-09-29 ENCOUNTER — Inpatient Hospital Stay: Admit: 2020-09-29 | Payer: MEDICARE | Primary: Physician Assistant

## 2020-09-29 DIAGNOSIS — E1140 Type 2 diabetes mellitus with diabetic neuropathy, unspecified: Secondary | ICD-10-CM

## 2020-09-30 LAB — COMPREHENSIVE METABOLIC PANEL
ALT: 16 U/L (ref 12–78)
AST: 13 U/L — ABNORMAL LOW (ref 15–37)
Albumin: 3.9 gm/dl (ref 3.4–5.0)
Alkaline Phosphatase: 90 U/L (ref 45–117)
Anion Gap: 4 mmol/L — ABNORMAL LOW (ref 5–15)
BUN: 16 mg/dl (ref 7–25)
CO2: 38 mEq/L — ABNORMAL HIGH (ref 21–32)
Calcium: 9.6 mg/dl (ref 8.5–10.1)
Chloride: 102 mEq/L (ref 98–107)
Creatinine: 0.8 mg/dl (ref 0.6–1.3)
EGFR IF NonAfrican American: >60
GFR African American: >60
Glucose: 114 mg/dl — ABNORMAL HIGH (ref 74–106)
Potassium: 4 mEq/L (ref 3.5–5.1)
Sodium: 144 mEq/L (ref 136–145)
Total Bilirubin: 0.6 mg/dl (ref 0.2–1.0)
Total Protein: 8 gm/dl (ref 6.4–8.2)

## 2020-09-30 LAB — HEMOGLOBIN A1C W/O EAG
Hemoglobin A1C: 7.2 % — ABNORMAL HIGH (ref 4.2–5.6)
Hemoglobin A1c: 7.2 % — ABNORMAL HIGH (ref 4.2–5.6)

## 2020-09-30 LAB — METABOLIC PANEL, COMPREHENSIVE
ALT (SGPT): 16 U/L (ref 12–78)
AST (SGOT): 13 U/L — ABNORMAL LOW (ref 15–37)
Albumin: 3.9 gm/dl (ref 3.4–5.0)
Alk. phosphatase: 90 U/L (ref 45–117)
Anion gap: 4 mmol/L — ABNORMAL LOW (ref 5–15)
BUN: 16 mg/dl (ref 7–25)
Bilirubin, total: 0.6 mg/dl (ref 0.2–1.0)
CO2: 38 mEq/L — ABNORMAL HIGH (ref 21–32)
Calcium: 9.6 mg/dl (ref 8.5–10.1)
Chloride: 102 mEq/L (ref 98–107)
Creatinine: 0.8 mg/dl (ref 0.6–1.3)
GFR est AA: >60
GFR est non-AA: >60
Glucose: 114 mg/dl — ABNORMAL HIGH (ref 74–106)
Potassium: 4 mEq/L (ref 3.5–5.1)
Protein, total: 8 gm/dl (ref 6.4–8.2)
Sodium: 144 mEq/L (ref 136–145)

## 2020-10-02 ENCOUNTER — Encounter

## 2020-10-06 ENCOUNTER — Inpatient Hospital Stay: Payer: MEDICARE | Attending: Physician Assistant | Primary: Physician Assistant

## 2020-10-07 ENCOUNTER — Encounter

## 2020-10-07 MED ORDER — ALLOPURINOL 100 MG TAB
100 mg | ORAL_TABLET | ORAL | 0 refills | Status: DC
Start: 2020-10-07 — End: 2020-11-03

## 2020-10-07 NOTE — Telephone Encounter (Signed)
Call pt for rx pick up

## 2020-10-12 ENCOUNTER — Ambulatory Visit: Payer: MEDICARE | Primary: Physician Assistant

## 2020-10-15 ENCOUNTER — Inpatient Hospital Stay: Admit: 2020-10-15 | Payer: MEDICARE | Attending: Physician Assistant | Primary: Physician Assistant

## 2020-10-15 DIAGNOSIS — R2232 Localized swelling, mass and lump, left upper limb: Secondary | ICD-10-CM

## 2020-11-03 ENCOUNTER — Encounter

## 2020-11-03 MED ORDER — ALLOPURINOL 100 MG TAB
100 mg | ORAL_TABLET | ORAL | 0 refills | Status: DC
Start: 2020-11-03 — End: 2020-11-30

## 2020-11-03 NOTE — Telephone Encounter (Signed)
Call pt for rx pick up

## 2020-11-04 ENCOUNTER — Ambulatory Visit: Payer: MEDICARE | Primary: Physician Assistant

## 2020-11-05 ENCOUNTER — Encounter: Payer: MEDICARE | Attending: Orthopaedic Surgery | Primary: Physician Assistant

## 2020-11-23 MED ORDER — ALLOPURINOL 100 MG TAB
100 mg | ORAL_TABLET | Freq: Two times a day (BID) | ORAL | 1 refills | Status: DC
Start: 2020-11-23 — End: 2021-04-12

## 2020-11-23 NOTE — Telephone Encounter (Signed)
Patients husband called to add [facility] pharmacy to chart.  We should be getting a fax for a refill of rx allopurinoL (ZYLOPRIM) 100 mg tablet - please send to updated [facility] mail order pharmacy on file.

## 2020-11-23 NOTE — Telephone Encounter (Signed)
rx sent to pharmacy

## 2020-11-23 NOTE — Telephone Encounter (Signed)
 LMOVM - no identifiers. Please relay comments below if patient returns call.     refill RX allopurinoL  (ZYLOPRIM ) 100 mg tablet - sent to [facility] mail order pharmacy on file

## 2020-11-26 NOTE — Telephone Encounter (Signed)
 For Pharmacy Admin Tracking Only    . CPA in place:   . Recommendation Provided To:   Marland Kitchen Intervention Detail: New Rx: 1, reason: Patient Preference  . Gap Closed?:   . Intervention Accepted By:   . Time Spent (min): 5

## 2020-11-30 ENCOUNTER — Encounter

## 2020-11-30 MED ORDER — ALLOPURINOL 100 MG TAB
100 mg | ORAL_TABLET | ORAL | 0 refills | Status: DC
Start: 2020-11-30 — End: 2021-01-24

## 2020-11-30 NOTE — Telephone Encounter (Signed)
Call pt for rx pick up

## 2020-12-09 ENCOUNTER — Inpatient Hospital Stay: Payer: MEDICARE | Attending: Physician Assistant | Primary: Physician Assistant

## 2020-12-10 ENCOUNTER — Encounter: Payer: MEDICARE | Attending: Orthopaedic Surgery | Primary: Physician Assistant

## 2020-12-17 ENCOUNTER — Ambulatory Visit: Payer: MEDICARE | Primary: Physician Assistant

## 2021-01-07 ENCOUNTER — Inpatient Hospital Stay: Admit: 2021-01-07 | Payer: MEDICARE | Primary: Physician Assistant

## 2021-01-07 DIAGNOSIS — E1140 Type 2 diabetes mellitus with diabetic neuropathy, unspecified: Secondary | ICD-10-CM

## 2021-01-07 LAB — CBC WITH AUTO DIFFERENTIAL
Basophils %: 0.3 % (ref 0–3)
Eosinophils %: 1 % (ref 0–5)
Hematocrit: 39 % (ref 37.0–50.0)
Hemoglobin: 11.8 gm/dl — ABNORMAL LOW (ref 13.0–17.2)
Immature Granulocytes: 0.3 % (ref 0.0–3.0)
Lymphocytes %: 15.7 % — ABNORMAL LOW (ref 28–48)
MCH: 28 pg (ref 25.4–34.6)
MCHC: 30.3 gm/dl (ref 30.0–36.0)
MCV: 92.6 fL (ref 80.0–98.0)
MPV: 11.9 fL — ABNORMAL HIGH (ref 6.0–10.0)
Monocytes %: 5.7 % (ref 1–13)
Neutrophils %: 77 % — ABNORMAL HIGH (ref 34–64)
Nucleated RBCs: 0 (ref 0–0)
Platelets: 159 10*3/uL (ref 140–450)
RBC: 4.21 M/uL (ref 3.60–5.20)
RDW-SD: 48.4 — ABNORMAL HIGH (ref 36.4–46.3)
WBC: 11.1 10*3/uL — ABNORMAL HIGH (ref 4.0–11.0)

## 2021-01-07 LAB — COMPREHENSIVE METABOLIC PANEL
ALT: <7 U/L — ABNORMAL LOW (ref 10–49)
AST: 16 U/L (ref 0.0–33.9)
Albumin: 3.8 gm/dl (ref 3.4–5.0)
Alkaline Phosphatase: 75 U/L (ref 46–116)
Anion Gap: 3 mmol/L — ABNORMAL LOW (ref 5–15)
BUN: 12 mg/dl (ref 9–23)
CO2: >40 mEq/L — ABNORMAL HIGH (ref 20–31)
Calcium: 9.8 mg/dl (ref 8.7–10.4)
Chloride: 96 mEq/L — ABNORMAL LOW (ref 98–107)
Creatinine: 0.62 mg/dl (ref 0.55–1.02)
EGFR IF NonAfrican American: >60
GFR African American: >60
Glucose: 76 mg/dl (ref 74–106)
Potassium: 3.8 mEq/L (ref 3.4–4.5)
Sodium: 139 mEq/L (ref 136–145)
Total Bilirubin: 0.5 mg/dl (ref 0.30–1.20)
Total Protein: 7.5 gm/dl (ref 5.7–8.2)

## 2021-01-07 LAB — HEMOGLOBIN A1C W/O EAG
Hemoglobin A1C: 5.5 % (ref 3.8–5.6)
Hemoglobin A1c: 5.5 % (ref 3.8–5.6)

## 2021-01-07 LAB — METABOLIC PANEL, COMPREHENSIVE
ALT (SGPT): <7 U/L — ABNORMAL LOW (ref 10–49)
AST (SGOT): 16 U/L (ref 0.0–33.9)
Albumin: 3.8 g/dL (ref 3.4–5.0)
Alk. phosphatase: 75 U/L (ref 46–116)
Anion gap: 3 mmol/L — ABNORMAL LOW (ref 5–15)
BUN: 12 mg/dL (ref 9–23)
Bilirubin, total: 0.5 mg/dL (ref 0.30–1.20)
CO2: >40 mEq/L — ABNORMAL HIGH (ref 20–31)
Calcium: 9.8 mg/dL (ref 8.7–10.4)
Chloride: 96 mEq/L — ABNORMAL LOW (ref 98–107)
Creatinine: 0.62 mg/dL (ref 0.55–1.02)
GFR est AA: >60
GFR est non-AA: >60
Glucose: 76 mg/dL (ref 74–106)
Potassium: 3.8 meq/L (ref 3.4–4.5)
Protein, total: 7.5 g/dL (ref 5.7–8.2)
Sodium: 139 meq/L (ref 136–145)

## 2021-01-07 LAB — CBC WITH AUTOMATED DIFF
BASOPHILS: 0.3 % (ref 0–3)
EOSINOPHILS: 1 % (ref 0–5)
HCT: 39 % (ref 37.0–50.0)
HGB: 11.8 gm/dl — ABNORMAL LOW (ref 13.0–17.2)
IMMATURE GRANULOCYTES: 0.3 % (ref 0.0–3.0)
LYMPHOCYTES: 15.7 % — ABNORMAL LOW (ref 28–48)
MCH: 28 pg (ref 25.4–34.6)
MCHC: 30.3 g/dL (ref 30.0–36.0)
MCV: 92.6 fL (ref 80.0–98.0)
MONOCYTES: 5.7 % (ref 1–13)
MPV: 11.9 fL — ABNORMAL HIGH (ref 6.0–10.0)
NEUTROPHILS: 77 % — ABNORMAL HIGH (ref 34–64)
NRBC: 0 (ref 0–0)
PLATELET: 159 10*3/uL (ref 140–450)
RBC: 4.21 M/uL (ref 3.60–5.20)
RDW-SD: 48.4 — ABNORMAL HIGH (ref 36.4–46.3)
WBC: 11.1 10*3/uL — ABNORMAL HIGH (ref 4.0–11.0)

## 2021-01-24 ENCOUNTER — Emergency Department: Admit: 2021-01-24 | Payer: MEDICARE | Primary: Physician Assistant

## 2021-01-24 ENCOUNTER — Emergency Department: Payer: MEDICARE | Primary: Physician Assistant

## 2021-01-24 ENCOUNTER — Inpatient Hospital Stay
Admit: 2021-01-24 | Discharge: 2021-01-28 | Disposition: A | Discharge status: Home / Self Care | Payer: MEDICARE | Attending: Internal Medicine | Admitting: Internal Medicine

## 2021-01-24 DIAGNOSIS — J439 Emphysema, unspecified: Secondary | ICD-10-CM

## 2021-01-24 LAB — POC BLOOD GAS + LACTIC ACID
BASE EXCESS: 15 mmol/L — ABNORMAL HIGH (ref <=3)
BASE EXCESS: 17 mmol/L — ABNORMAL HIGH (ref <=3)
BICARBONATE: 40.5 mmol/L — CR (ref 18.0–26.0)
BICARBONATE: 41.5 mmol/L — CR (ref 18.0–26.0)
Base Excess: 15 mmol/L — ABNORMAL HIGH (ref <=3)
Base Excess: 17 mmol/L — ABNORMAL HIGH (ref <=3)
CO2 Total: 43 mmol/L — ABNORMAL HIGH (ref 24–29)
CO2 Total: 44 mmol/L — ABNORMAL HIGH (ref 24–29)
CO2, TOTAL: 43 mmol/L — ABNORMAL HIGH (ref 24–29)
CO2, TOTAL: 44 mmol/L — ABNORMAL HIGH (ref 24–29)
EPAP/CPAP/PEEP: 10
EPAP/CPAP/PEEP: 10
FIO2: 35
FIO2: 35
HCO3: 40.5 mmol/L (ref 18.0–26.0)
HCO3: 41.5 mmol/L (ref 18.0–26.0)
IPAP/PIP: 22
IPAP/PIP: 22
LACTIC ACID: 1.14 mmol/L (ref 0.40–2.00)
LACTIC ACID: 0.63 mmol/L (ref 0.40–2.00)
LITERS PER MINUTE, LPM: 4
Lactic Acid: 1.14 mmol/L (ref 0.40–2.00)
Lactic Acid: 0.63 mmol/L (ref 0.40–2.00)
Liters/min.: 4
O2 SAT: 89 % — ABNORMAL LOW (ref 90–100)
O2 SAT: 96 % (ref 90–100)
O2 Sat: 89 % — ABNORMAL LOW (ref 90–100)
O2 Sat: 96 % (ref 90–100)
PCO2: 69.3 mm Hg (ref 35.0–45.0)
PCO2: 69.3 mm Hg — CR (ref 35.0–45.0)
PCO2: 66.3 mm Hg (ref 35.0–45.0)
PCO2: 66.3 mm Hg — CR (ref 35.0–45.0)
PO2: 61 mm Hg — ABNORMAL LOW (ref 75–100)
PO2: 61 mm Hg — ABNORMAL LOW (ref 75–100)
PO2: 86 mm Hg (ref 75–100)
PO2: 86 mm Hg (ref 75–100)
Patient temp.: 98.7
Patient temperature: 98.7
RESPIRATORY RATE: 19
Respiratory Rate: 19
SET RATE: 12
Set Rate: 12
VTEXP, VTEXP: 479
VTEXP: 479
pH: 7.375 (ref 7.350–7.450)
pH: 7.375 (ref 7.350–7.450)
pH: 7.405 (ref 7.350–7.450)
pH: 7.405 (ref 7.350–7.450)

## 2021-01-24 LAB — COMPREHENSIVE METABOLIC PANEL
ALT: <7 U/L — ABNORMAL LOW (ref 10–49)
AST: 12 U/L (ref 0.0–33.9)
Albumin: 3.8 gm/dl (ref 3.4–5.0)
Alkaline Phosphatase: 81 U/L (ref 46–116)
Anion Gap: 6 mmol/L (ref 5–15)
BUN: 10 mg/dl (ref 9–23)
CO2: 36 mEq/L — ABNORMAL HIGH (ref 20–31)
Calcium: 9.3 mg/dl (ref 8.7–10.4)
Chloride: 98 mEq/L (ref 98–107)
Creatinine: 0.75 mg/dl (ref 0.55–1.02)
EGFR IF NonAfrican American: >60
GFR African American: >60
Glucose: 198 mg/dl — ABNORMAL HIGH (ref 74–106)
Potassium: 3.3 mEq/L — ABNORMAL LOW (ref 3.5–5.1)
Sodium: 140 mEq/L (ref 136–145)
Total Bilirubin: 0.5 mg/dl (ref 0.30–1.20)
Total Protein: 7.6 gm/dl (ref 5.7–8.2)

## 2021-01-24 LAB — TROPONIN, HIGH SENSITIVITY
Troponin, High Sensitivity: <3 ng/L (ref 0–59)
Troponin, High Sensitivity: <3 ng/L (ref 0–59)
Troponin, High Sensitivity: 4 ng/L (ref 0–59)

## 2021-01-24 LAB — CBC WITH AUTO DIFFERENTIAL
Basophils %: 0.3 % (ref 0–3)
Eosinophils %: 0.8 % (ref 0–5)
Hematocrit: 40.9 % (ref 37.0–50.0)
Hemoglobin: 12.8 gm/dl — ABNORMAL LOW (ref 13.0–17.2)
Immature Granulocytes: 0.7 % (ref 0.0–3.0)
Lymphocytes %: 13.1 % — ABNORMAL LOW (ref 28–48)
MCH: 28.3 pg (ref 25.4–34.6)
MCHC: 31.3 gm/dl (ref 30.0–36.0)
MCV: 90.5 fL (ref 80.0–98.0)
MPV: 11 fL — ABNORMAL HIGH (ref 6.0–10.0)
Monocytes %: 4.3 % (ref 1–13)
Neutrophils %: 80.8 % — ABNORMAL HIGH (ref 34–64)
Nucleated RBCs: 0 (ref 0–0)
Platelets: 196 10*3/uL (ref 140–450)
RBC: 4.52 M/uL (ref 3.60–5.20)
RDW-SD: 47.8 — ABNORMAL HIGH (ref 36.4–46.3)
WBC: 12.3 10*3/uL — ABNORMAL HIGH (ref 4.0–11.0)

## 2021-01-24 LAB — PROBNP, N-TERMINAL: BNP: 27 (ref 0–125)

## 2021-01-24 LAB — TROPONIN-HIGH SENSITIVITY
Troponin-High Sensitivity: <3 ng/L (ref 0–59)
Troponin-High Sensitivity: <3 ng/L (ref 0–59)
Troponin-High Sensitivity: 4 ng/L (ref 0–59)

## 2021-01-24 LAB — CBC WITH AUTOMATED DIFF
BASOPHILS: 0.3 % (ref 0–3)
EOSINOPHILS: 0.8 % (ref 0–5)
HCT: 40.9 % (ref 37.0–50.0)
HGB: 12.8 gm/dl — ABNORMAL LOW (ref 13.0–17.2)
IMMATURE GRANULOCYTES: 0.7 % (ref 0.0–3.0)
LYMPHOCYTES: 13.1 % — ABNORMAL LOW (ref 28–48)
MCH: 28.3 pg (ref 25.4–34.6)
MCHC: 31.3 gm/dl (ref 30.0–36.0)
MCV: 90.5 fL (ref 80.0–98.0)
MONOCYTES: 4.3 % (ref 1–13)
MPV: 11 fL — ABNORMAL HIGH (ref 6.0–10.0)
NEUTROPHILS: 80.8 % — ABNORMAL HIGH (ref 34–64)
NRBC: 0 (ref 0–0)
PLATELET: 196 10*3/uL (ref 140–450)
RBC: 4.52 M/uL (ref 3.60–5.20)
RDW-SD: 47.8 — ABNORMAL HIGH (ref 36.4–46.3)
WBC: 12.3 10*3/uL — ABNORMAL HIGH (ref 4.0–11.0)

## 2021-01-24 LAB — METABOLIC PANEL, COMPREHENSIVE
ALT (SGPT): <7 U/L — ABNORMAL LOW (ref 10–49)
AST (SGOT): 12 U/L (ref 0.0–33.9)
Albumin: 3.8 gm/dl (ref 3.4–5.0)
Alk. phosphatase: 81 U/L (ref 46–116)
Anion gap: 6 mmol/L (ref 5–15)
BUN: 10 mg/dl (ref 9–23)
Bilirubin, total: 0.5 mg/dl (ref 0.30–1.20)
CO2: 36 mEq/L — ABNORMAL HIGH (ref 20–31)
Calcium: 9.3 mg/dl (ref 8.7–10.4)
Chloride: 98 mEq/L (ref 98–107)
Creatinine: 0.75 mg/dl (ref 0.55–1.02)
GFR est AA: >60
GFR est non-AA: >60
Glucose: 198 mg/dl — ABNORMAL HIGH (ref 74–106)
Potassium: 3.3 mEq/L — ABNORMAL LOW (ref 3.5–5.1)
Protein, total: 7.6 gm/dl (ref 5.7–8.2)
Sodium: 140 mEq/L (ref 136–145)

## 2021-01-24 LAB — NT-PRO BNP: NT pro-BNP: 27 (ref 0–125)

## 2021-01-24 MED ORDER — APIXABAN 5 MG TABLET
5 mg | Freq: Two times a day (BID) | ORAL | Status: DC
Start: 2021-01-24 — End: 2021-01-28

## 2021-01-24 MED ORDER — DEXTROSE 50% IN WATER (D50W) IV SYRG
INTRAVENOUS | Status: DC | PRN
Start: 2021-01-24 — End: 2021-01-28

## 2021-01-24 MED ORDER — INSULIN GLARGINE 100 UNIT/ML INJECTION
100 unit/mL | Freq: Every evening | SUBCUTANEOUS | Status: DC
Start: 2021-01-24 — End: 2021-01-28
  Administered 2021-01-25 – 2021-01-28 (×4): via SUBCUTANEOUS

## 2021-01-24 MED ORDER — MONTELUKAST 10 MG TAB
10 mg | Freq: Every day | ORAL | Status: DC
Start: 2021-01-24 — End: 2021-01-28
  Administered 2021-01-25 – 2021-01-28 (×4): via ORAL

## 2021-01-24 MED ORDER — ONDANSETRON (PF) 4 MG/2 ML INJECTION
4 mg/2 mL | INTRAMUSCULAR | Status: DC | PRN
Start: 2021-01-24 — End: 2021-01-28
  Administered 2021-01-27 – 2021-01-28 (×3): via INTRAVENOUS

## 2021-01-24 MED ORDER — GLUCAGON 1 MG INJECTION
1 mg | INTRAMUSCULAR | Status: DC | PRN
Start: 2021-01-24 — End: 2021-01-28

## 2021-01-24 MED ORDER — INSULIN LISPRO 100 UNIT/ML INJECTION
100 unit/mL | Freq: Four times a day (QID) | SUBCUTANEOUS | Status: DC
Start: 2021-01-24 — End: 2021-01-28
  Administered 2021-01-25 – 2021-01-28 (×10): via SUBCUTANEOUS

## 2021-01-24 MED ORDER — NALOXONE 0.4 MG/ML INJECTION
0.4 mg/mL | INTRAMUSCULAR | Status: DC | PRN
Start: 2021-01-24 — End: 2021-01-28

## 2021-01-24 MED ORDER — HEPARIN (PORCINE) 5,000 UNIT/ML IJ SOLN
5000 unit/mL | Freq: Three times a day (TID) | INTRAMUSCULAR | Status: DC
Start: 2021-01-24 — End: 2021-01-24
  Administered 2021-01-24: 19:00:00 via SUBCUTANEOUS

## 2021-01-24 MED ORDER — APIXABAN 5 MG TABLET
5 mg | Freq: Two times a day (BID) | ORAL | Status: DC
Start: 2021-01-24 — End: 2021-01-28
  Administered 2021-01-25 – 2021-01-28 (×8): via ORAL

## 2021-01-24 MED ORDER — ALLOPURINOL 100 MG TAB
100 mg | Freq: Two times a day (BID) | ORAL | Status: DC
Start: 2021-01-24 — End: 2021-01-28
  Administered 2021-01-25 – 2021-01-28 (×8): via ORAL

## 2021-01-24 MED ORDER — NITROGLYCERIN IN D5W 100 MG/250 ML (0.4 MG/ML) IV
100 mg/250 mL (400 mcg/mL) | INTRAVENOUS | Status: DC
Start: 2021-01-24 — End: 2021-01-25
  Administered 2021-01-24 – 2021-01-25 (×5): via INTRAVENOUS

## 2021-01-24 MED ORDER — APIXABAN 5 MG TABLET
5 mg | Freq: Two times a day (BID) | ORAL | Status: DC
Start: 2021-01-24 — End: 2021-01-24

## 2021-01-24 MED ORDER — APIXABAN 5 MG TABLET
5 mg | ORAL | Status: AC
Start: 2021-01-24 — End: 2021-01-24
  Administered 2021-01-24: 17:00:00 via ORAL

## 2021-01-24 MED ORDER — ACETAMINOPHEN 325 MG TABLET
325 mg | ORAL | Status: DC | PRN
Start: 2021-01-24 — End: 2021-01-28
  Administered 2021-01-25 – 2021-01-28 (×3): via ORAL

## 2021-01-24 MED ORDER — ROSUVASTATIN 10 MG TAB
10 mg | Freq: Every evening | ORAL | Status: DC
Start: 2021-01-24 — End: 2021-01-28
  Administered 2021-01-25 – 2021-01-28 (×4): via ORAL

## 2021-01-24 MED ORDER — LOSARTAN 50 MG TAB
50 mg | Freq: Every day | ORAL | Status: DC
Start: 2021-01-24 — End: 2021-01-25

## 2021-01-24 MED ORDER — FUROSEMIDE 10 MG/ML IJ SOLN
10 mg/mL | Freq: Once | INTRAMUSCULAR | Status: AC
Start: 2021-01-24 — End: 2021-01-24
  Administered 2021-01-24: 13:00:00 via INTRAVENOUS

## 2021-01-24 MED ORDER — INSULIN LISPRO 100 UNIT/ML INJECTION
100 unit/mL | SUBCUTANEOUS | Status: DC | PRN
Start: 2021-01-24 — End: 2021-01-28
  Administered 2021-01-25 – 2021-01-28 (×9): via SUBCUTANEOUS

## 2021-01-24 MED ORDER — METHYLPREDNISOLONE (PF) 40 MG/ML IJ SOLR
40 mg/mL | Freq: Three times a day (TID) | INTRAMUSCULAR | Status: DC
Start: 2021-01-24 — End: 2021-01-25
  Administered 2021-01-24 – 2021-01-25 (×3): via INTRAVENOUS

## 2021-01-24 MED ORDER — TIOTROPIUM BROMIDE 2.5 MCG/ACTUATION MIST FOR INHALATION
2.5 mcg/actuation | Freq: Every day | RESPIRATORY_TRACT | Status: DC
Start: 2021-01-24 — End: 2021-01-25
  Administered 2021-01-25: 13:00:00 via RESPIRATORY_TRACT

## 2021-01-24 MED ORDER — ACETAMINOPHEN (TYLENOL) SOLUTION 32MG/ML
ORAL | Status: DC | PRN
Start: 2021-01-24 — End: 2021-01-28

## 2021-01-24 MED ORDER — ACETAMINOPHEN 500 MG TAB
500 mg | ORAL | Status: AC
Start: 2021-01-24 — End: 2021-01-24
  Administered 2021-01-24: 13:00:00 via ORAL

## 2021-01-24 MED ORDER — SODIUM CHLORIDE 0.9 % IJ SYRG
Freq: Once | INTRAMUSCULAR | Status: AC
Start: 2021-01-24 — End: 2021-01-24
  Administered 2021-01-24: 13:00:00 via INTRAVENOUS

## 2021-01-24 MED ORDER — ACETAMINOPHEN 650 MG RECTAL SUPPOSITORY
650 mg | RECTAL | Status: DC | PRN
Start: 2021-01-24 — End: 2021-01-28

## 2021-01-24 MED FILL — FUROSEMIDE 10 MG/ML IJ SOLN: 10 mg/mL | INTRAMUSCULAR | Qty: 4

## 2021-01-24 MED FILL — TIOTROPIUM BROMIDE 2.5 MCG/ACTUATION MIST FOR INHALATION: 2.5 mcg/actuation | RESPIRATORY_TRACT | Qty: 4

## 2021-01-24 MED FILL — NITROGLYCERIN IN D5W 100 MG/250 ML (0.4 MG/ML) IV: 100 mg/250 mL (400 mcg/mL) | INTRAVENOUS | Qty: 250

## 2021-01-24 MED FILL — ELIQUIS 5 MG TABLET: 5 mg | ORAL | Qty: 2

## 2021-01-24 MED FILL — ACETAMINOPHEN 500 MG TAB: 500 mg | ORAL | Qty: 2

## 2021-01-24 MED FILL — SOLU-MEDROL (PF) 40 MG/ML SOLUTION FOR INJECTION: 40 mg/mL | INTRAMUSCULAR | Qty: 1

## 2021-01-24 NOTE — Progress Notes (Signed)
Formatting of this note might be different from the original.  Pt transported to room 3213 on BIPAP from ER 23.  Electronically signed by Cherylann Parr, RT at 01/24/2021  6:13 PM EDT

## 2021-01-24 NOTE — ED Notes (Signed)
 Pt brought in by EMS from home with c/o L arm pain & swelling x2 weeks, stating I think I just slept on it wrong.  Pt also c/o increased SOB x3 wks.  Pt is normally on 3L O2 via NC at home, with EMS finding O2 sat = 81%.  Pt placed on NRB by EMS.

## 2021-01-24 NOTE — ED Notes (Signed)
Pt has been having increased work of breathing, needing to sit with HOB at 90.  Family at bedside updated on plan of care.  MD notified of change in pt status.

## 2021-01-24 NOTE — Progress Notes (Signed)
Bedside shift change report given to Harrison Mons, Charity fundraiser  (oncoming nurse) by Lorelle Formosa, RN (offgoing nurse). Report included the following information SBAR, Kardex, Intake/Output, MAR, Recent Results, and Cardiac Rhythm SR .

## 2021-01-24 NOTE — ED Provider Notes (Signed)
Formatting of this note is different from the original.  [facility]  Emergency Department Treatment Report    Patient: [name] [name] Age: 70 y.o. Sex: female    Date of Birth: 1950-07-06 Admit Date: 01/24/2021 PCP: Ernst Breach   MRN: [id number]  CSN: [id number]     Room: ER23/ER23 Time Dictated: 8:20 AM      Chief Complaint   Chief Complaint   Patient presents with    Arm swelling    Congestive Heart Failure       History of Present Illness   70 y.o. female past medical history of chronic pain COPD diabetes reflux hep C hypertension PTSD thoracic ascending aortic aneurysm who presents with shortness of breath.  Patient states that she has had shortness of breath worse with laying down for the past week it is associate with abdominal distention no leg swelling.  No chest pain no fevers no chills.  Patient also states that she has had left arm pain and swelling for 2 weeks.  No history of similar.  Does follow with Ortho for gout but states this is not similar.    Review of Systems   Review of Systems   Respiratory:  Positive for shortness of breath. Negative for cough and chest tightness.    Neurological:  Negative for seizures and numbness.   All other systems reviewed and are negative.     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    DM2 (diabetes mellitus, type 2) (HCC)     GERD (gastroesophageal reflux disease)     Hepatitis C     HEP C    HTN (hypertension)     Lung nodule, multiple     (CT 10/2016) Small left upper lobe and 1.7 x 1.6 cm left lower lobe nodules not significantly changed from 07/23/2016 and 03/22/2016    Marijuana use     PTSD (post-traumatic stress disorder)     lived through the [place] in 1993    Thoracic ascending aortic aneurysm [facility])     4.4cm noted on CT  Chest (10/2016)    Thyroid nodule     2.5 cm stable right thyroid nodule (CT 10/2016)     Past Surgical History:   Procedure Laterality Date    HX ENDOSCOPY      HX HERNIA REPAIR  2019    x2    HX HIP REPLACEMENT Right 01/29/2015    HX HIP REPLACEMENT      HX HYSTERECTOMY  2010    hysterectomy     Social History     Social History     Socioeconomic History    Marital status: MARRIED     Spouse name: Not on file    Number of children: Not on file    Years of education: Not on file    Highest education level: Not on file   Occupational History    Not on file   Tobacco Use    Smoking status: Some Days    Smokeless tobacco: Never    Tobacco comments:     reported as quit smoking around 1990s per spouse   Substance and Sexual Activity    Alcohol use: No  Drug use: Yes     Types: Marijuana     Comment: reports smoking last week-marijuana    Sexual activity: Never   Other Topics Concern    Not on file   Social History Narrative    Not on file     Social Determinants of Health     Financial Resource Strain: Not on file   Food Insecurity: Not on file   Transportation Needs: Not on file   Physical Activity: Not on file   Stress: Not on file   Social Connections: Not on file   Intimate Partner Violence: Not on file   Housing Stability: Not on file     Family History     Family History   Problem Relation Age of Onset    Hypertension Mother     Other Mother         OSA    Cancer Father         suspected leukemia per pt's spouse    Cancer Maternal Aunt     Alcohol abuse Maternal Grandfather      Current Medications     Prior to Admission Medications   Prescriptions Last Dose Informant Patient Reported? Taking?   QUEtiapine SR (SEROQUEL XR) 150 mg sr tablet   Yes No   Sig: Take 150 mg by mouth nightly as needed.   acetaminophen (TYLENOL) 325 mg tablet   No No   Sig: Take 2 Tabs by mouth every four (4) hours as needed for Pain.   Patient not taking: Reported on 05/26/2020   albuterol (ACCUNEB) 1.25 mg/3 mL nebu   No No   Sig: USE 1  VIAL IN NEBULIZER EVERY 6 HOURS AS NEEDED FOR  SHORTNESS  OF  BREATH,  WHEEZING   albuterol (PROVENTIL HFA, VENTOLIN HFA, PROAIR HFA) 90 mcg/actuation inhaler   No No   Sig: Take 2 Puffs by inhalation every four (4) hours as needed for Wheezing.   allopurinoL (ZYLOPRIM) 100 mg tablet   No No   Sig: Take 1 Tablet by mouth two (2) times a day.   allopurinoL (ZYLOPRIM) 100 mg tablet   No No   Sig: Take 1 tablet by mouth twice daily   butalbital-acetaminophen-caffeine (FIORICET, ESGIC) 50-325-40 mg per tablet   Yes No   Sig: butalbital-acetaminophen-caffeine 50 mg-325 mg-40 mg tablet   TAKE 1 TABLET BY MOUTH EVERY 4 HOURS AS NEEDED FOR HEADACHE   cetirizine (ZYRTEC) 10 mg tablet   No No   Sig: Take 1 Tab by mouth daily.   cloNIDine HCl (CATAPRES) 0.1 mg tablet   No No   Sig: Take 3 Tabs by mouth two (2) times a day.   dilTIAZem CD (CARDIZEM CD) 240 mg ER capsule   No No   Sig: Take 1 Cap by mouth daily.   fluticasone propion-salmeterol (ADVAIR DISKUS) 100-50 mcg/dose diskus inhaler   No No   Sig: Take 1 Puff by inhalation two (2) times a day.   fluticasone propionate (FLONASE) 50 mcg/actuation nasal spray   No No   Sig: 2 Sprays by Both Nostrils route daily as needed for Rhinitis or Allergies. Indications: inflammation of the nose due to an allergy   furosemide (LASIX) 20 mg tablet   No No   Sig: Take 1 Tablet by mouth daily. Indications: fluid in the lungs due to chronic heart failure   losartan (COZAAR) 50 mg tablet   No No   Sig: Take 1 Tab by mouth daily.  magnesium oxide (MAG-OX) 400 mg tablet   Yes No   Sig: Take 400 mg by mouth daily.   metFORMIN ER (GLUCOPHAGE XR) 500 mg tablet   No No   Sig: TAKE 1 TABLET TWICE DAILY   montelukast (SINGULAIR) 10 mg tablet   No No   Sig: TAKE 1 TABLET EVERY DAY   polyethylene glycol (MIRALAX) 17 gram packet   No No   Sig: Take 1 Packet by mouth daily as needed for Constipation. Indications: constipation   potassium chloride SR (K-TAB) 20 mEq tablet   Yes No   Sig: potassium  chloride ER 20 mEq tablet,extended release   TAKE 1 TABLET BY MOUTH ONCE DAILY   predniSONE (DELTASONE) 10 mg tablet   No No   Sig: Take 2 tablets daily from 12/22-12/23, then decrease to 1 tablet daily for 3 days from 12/24-12/26, then stop.  Indications: worsening chronic obstructive pulmonary disease   rosuvastatin (CRESTOR) 5 mg tablet   No No   Sig: TAKE 1 TABLET BY MOUTH NIGHTLY   tiotropium bromide (SPIRIVA RESPIMAT) 2.5 mcg/actuation inhaler   No No   Sig: Take 1 Puff by inhalation daily.     Facility-Administered Medications: None     Allergies     Allergies   Allergen Reactions    Chocolate [Cocoa] Sneezing     Confirms allergy but reports "I still eat it"     Physical Exam     ED Triage Vitals   ED Encounter Vitals Group      BP       Pulse       Resp       Temp       Temp src       SpO2       Weight       Height      Physical Exam  Constitutional:       Appearance: She is well-developed.   HENT:      Head: Normocephalic and atraumatic.   Eyes:      Pupils: Pupils are equal, round, and reactive to light.   Cardiovascular:      Rate and Rhythm: Normal rate and regular rhythm.      Heart sounds: Normal heart sounds. No murmur heard.    No friction rub.   Pulmonary:      Effort: Pulmonary effort is normal. No respiratory distress.      Breath sounds: Normal breath sounds. No wheezing.   Abdominal:      General: There is no distension.      Palpations: Abdomen is soft.      Tenderness: no abdominal tenderness There is no guarding or rebound.      Comments: Abdominal distention without pain   Musculoskeletal:         General: Normal range of motion.      Cervical back: Normal range of motion and neck supple.   Skin:     General: Skin is warm and dry.      Comments: Left diffuse swelling to the left hand without erythema nonpitting edema   Neurological:      Mental Status: She is alert and oriented to person, place, and time.   Psychiatric:         Behavior: Behavior normal.         Thought Content: Thought  content normal.     Impression and Management Plan   EKG per my read shows sinus at 103 with  a normal axis normal intervals there is no ST elevation or depression no hypertrophy.    Patient presents with worsening shortness of breath worse with laying down certainly suggestive of heart failure associate with left hand swelling less than abdominal distention does have a history of a thoracic aortic aneurysm also some pulmonary nodules.  We will do a duplex of the left hand/arm as well as check basic labs including BNP and a chest x-ray to evaluate for CHF.    Patient's became short of breath diaphoretic tripoding.  Started on nitro drip as well as BiPAP.  Now doing much better.  We will repeat x-ray discussed with Dr. Winona Legato for the hospitalist who accepts admission.  Also discussed with ICU based on the patient's improvement will admit to stepdown.    Diagnostic Studies   Lab:   Recent Results (from the past 12 hour(s))   CBC WITH AUTOMATED DIFF    Collection Time: 01/24/21  8:20 AM   Result Value Ref Range    WBC 12.3 (H) 4.0 - 11.0 1000/mm3    RBC 4.52 3.60 - 5.20 M/uL    HGB 12.8 (L) 13.0 - 17.2 gm/dl    HCT 40.9 37.0 - 50.0 %    MCV 90.5 80.0 - 98.0 fL    MCH 28.3 25.4 - 34.6 pg    MCHC 31.3 30.0 - 36.0 gm/dl    PLATELET 196 140 - 450 1000/mm3    MPV 11.0 (H) 6.0 - 10.0 fL    RDW-SD 47.8 (H) 36.4 - 46.3      NRBC 0 0 - 0      IMMATURE GRANULOCYTES 0.7 0.0 - 3.0 %    NEUTROPHILS 80.8 (H) 34 - 64 %    LYMPHOCYTES 13.1 (L) 28 - 48 %    MONOCYTES 4.3 1 - 13 %    EOSINOPHILS 0.8 0 - 5 %    BASOPHILS 0.3 0 - 3 %   METABOLIC PANEL, COMPREHENSIVE    Collection Time: 01/24/21  8:20 AM   Result Value Ref Range    Potassium 3.3 (L) 3.5 - 5.1 mEq/L    Chloride 98 98 - 107 mEq/L    Sodium 140 136 - 145 mEq/L    CO2 36 (H) 20 - 31 mEq/L    Glucose 198 (H) 74 - 106 mg/dl    BUN 10 9 - 23 mg/dl    Creatinine 0.75 0.55 - 1.02 mg/dl    GFR est AA >60.0      GFR est non-AA >60      Calcium 9.3 8.7 - 10.4 mg/dl    Anion gap 6 5 - 15  mmol/L    AST (SGOT) 12.0 0.0 - 33.9 U/L    ALT (SGPT) <7 (L) 10 - 49 U/L    Alk. phosphatase 81 46 - 116 U/L    Bilirubin, total 0.50 0.30 - 1.20 mg/dl    Protein, total 7.6 5.7 - 8.2 gm/dl    Albumin 3.8 3.4 - 5.0 gm/dl   TROPONIN-HIGH SENSITIVITY    Collection Time: 01/24/21  8:20 AM   Result Value Ref Range    Troponin-High Sensitivity <3 0 - 59 ng/L   NT-PRO BNP    Collection Time: 01/24/21  8:20 AM   Result Value Ref Range    NT pro-BNP 27 0 - 125     POC BLOOD GAS + LACTIC ACID    Collection Time: 01/24/21  9:07 AM   Result Value Ref Range  pH 7.375 7.350 - 7.450      PCO2 69.3 (HH) 35.0 - 45.0 mm Hg    PO2 61.0 (L) 75 - 100 mm Hg    BICARBONATE 40.5 (HH) 18.0 - 26.0 mmol/L    O2 SAT 89.0 (L) 90 - 100 %    CO2, TOTAL 43.0 (H) 24 - 29 mmol/L    Lactic Acid 1.14 0.40 - 2.00 mmol/L    BASE EXCESS 15 (H) -2 - 3 mmol/L    Patient temp. 98.7 F      Sample type Art      SITE R Radial      DEVICE Nasal Can      ALLENS TEST Pass      Liters/min. 4      NOTIFIED Doctor     TROPONIN-HIGH SENSITIVITY    Collection Time: 01/24/21 10:25 AM   Result Value Ref Range    Troponin-High Sensitivity <3 0 - 59 ng/L   TROPONIN-HIGH SENSITIVITY    Collection Time: 01/24/21 12:15 PM   Result Value Ref Range    Troponin-High Sensitivity 4 0 - 59 ng/L     Labs Reviewed   CBC WITH AUTOMATED DIFF - Abnormal; Notable for the following components:       Result Value    WBC 12.3 (*)     HGB 12.8 (*)     MPV 11.0 (*)     RDW-SD 47.8 (*)     NEUTROPHILS 80.8 (*)     LYMPHOCYTES 13.1 (*)     All other components within normal limits   METABOLIC PANEL, COMPREHENSIVE - Abnormal; Notable for the following components:    Potassium 3.3 (*)     CO2 36 (*)     Glucose 198 (*)     ALT (SGPT) <7 (*)     All other components within normal limits   POC BLOOD GAS + LACTIC ACID - Abnormal; Notable for the following components:    PCO2 69.3 (*)     PO2 61.0 (*)     BICARBONATE 40.5 (*)     O2 SAT 89.0 (*)     CO2, TOTAL 43.0 (*)     BASE EXCESS 15 (*)      All other components within normal limits   TROPONIN-HIGH SENSITIVITY   NT-PRO BNP   BLOOD GAS, ARTERIAL   TROPONIN-HIGH SENSITIVITY   TROPONIN-HIGH SENSITIVITY   BLOOD GAS, ARTERIAL     Imaging:    XR CHEST SNGL V    Result Date: 01/24/2021  XR CHEST SNGL V Clinical indication: sob. Comparison: 01/24/2021 Support devices: None Lungs: Stable exam demonstrating lucency overlying the left upper lung field, compatible with chronic bullous changes. Unchanged rounded density overlying the left midlung field. Remainder the lungs are unchanged. Cardiomediastinal: Normal morphology for radiographic technique. Bones/soft tissues: No acute abnormalities.     IMPRESSION:  Stable exam.     XR CHEST SNGL V    Result Date: 01/24/2021  Exam: AP portable chest Clinical indication: Chest Pain Comparison: 05/26/2020;  08/22/2019; CT chest 08/30/2018;  Results:  Extensive lucency left hemithorax. Extensive costochondral calcifications. Persistent subtle opacity left mid chest approximately 6 cm in size, unchanged. No pneumothorax. Ectatic ascending aorta. Heart normal. No change prominence of the right hilum. No free air is seen under the hemidiaphragms.   Osseous structures intact.     IMPRESSION: 1. Severe emphysema left lung with stable opacity left mid chest representing segmental collapse, unchanged. 2. No acute change. 3.  Mildly ectatic ascending aorta. No change.      ED Course     Patient Vitals for the past 12 hrs:   Temp Pulse Resp BP SpO2   01/24/21 1406 -- (!) 101 -- (!) 145/95 --   01/24/21 1346 -- 96 19 (!) 165/91 91 %   01/24/21 1342 -- 95 -- (!) 196/112 --   01/24/21 1338 -- -- -- -- 98 %   01/24/21 1331 -- (!) 103 25 (!) 196/112 (!) 89 %   01/24/21 1316 -- (!) 112 (!) 32 (!) 184/101 98 %   01/24/21 1301 -- (!) 112 24 (!) 197/127 (!) 86 %   01/24/21 1232 -- 92 22 (!) 158/92 95 %   01/24/21 0858 -- -- -- -- 91 %   01/24/21 0856 -- 99 -- (!) 171/105 91 %   01/24/21 0833 -- 98 -- (!) 166/96 --   01/24/21 0832 98.7 F (37.1 C)  -- -- -- --   01/24/21 0826 -- (!) 103 23 (!) 166/96 90 %   01/24/21 0813 -- (!) 102 -- (!) 174/120 95 %         Medications   nitroGLYcerin (TRIDIL) 400 mcg/ml infusion (10 mcg/min IntraVENous Rate Change 01/24/21 1406)   sodium chloride (NS) flush 5-10 mL (10 mL IntraVENous Given 01/24/21 0833)   furosemide (LASIX) injection 40 mg (40 mg IntraVENous Given 01/24/21 9741)   acetaminophen (TYLENOL) tablet 1,000 mg (1,000 mg Oral Given 01/24/21 0858)   apixaban (ELIQUIS) tablet 10 mg (10 mg Oral Given 01/24/21 1258)     Medical Decision Making     Critical Care Time:  The services I provided to this patient were to treat and/or prevent clinically significant deterioration that could result in the failure of one or more body systems and/or organ systems due to CHF .    Services included the following:  -reviewing nursing notes and old charts  -vital sign assessments  -direct patient care  -medication orders and management  -interpreting and reviewing diagnostic studies/labs  -re-evaluations  -documentation time    Aggregate critical care time was 90  minutes, which includes only time during which I was engaged in work directly related to the patient's care as described above, whether I was at bedside or elsewhere in the Emergency Department. It did not include time spent performing other reported procedures or the services of residents, students, nurses, or advance practice providers.      Jacinto Reap, MD;Ver*    2:31 PM    Case discussion with Dr. Winona Legato for the hospitalist service who accepts admission and agrees with plan.    Case discussed with Dr. Posey Pronto from cardiology who agrees to consult.    Final Diagnosis   No diagnosis found.    Disposition   Admit      Jacinto Reap, MD  January 24, 2021  8:20 AM    My signature above authenticates this document and my orders, the final    diagnosis (es), discharge prescription (s), and instructions in the Epic    record.  If you have any questions please contact  [phone].    Nursing notes have been reviewed by the physician/ advanced practice    Clinician.    Dragon medical dictation software was used for portions of this report. Unintended voice recognition errors may occur.      Electronically signed by Jacinto Reap, MD at 01/24/2021  3:00 PM EDT

## 2021-01-24 NOTE — ED Notes (Signed)
Formatting of this note might be different from the original.  Pt has been leaning on her left side since arrival, despite repositioning multiple times & instructions to try not to lean on L arm.   Electronically signed by Marlowe Kays, RN at 01/24/2021 11:17 AM EDT

## 2021-01-24 NOTE — Progress Notes (Signed)
Problem: Gas Exchange - Impaired  Goal: *Absence of hypoxia  Outcome: Progressing Towards Goal

## 2021-01-24 NOTE — Progress Notes (Signed)
Formatting of this note might be different from the original.  Peripheral Vascular Lab:  Left upper extremity venous study preliminary report.    Acute deep venous thrombosis left upper extremity with occlusive thrombus in the subclavian vein  No evidence of deep venous thrombosis in the left internal jugular, axillary, brachial, radial and ulnar veins.    Final report to follow.  Darrall Dears, RVT  Electronically signed by Nolon Stalls at 01/24/2021 10:24 AM EDT

## 2021-01-24 NOTE — Progress Notes (Signed)
Formatting of this note might be different from the original.    Problem: Gas Exchange - Impaired  Goal: *Absence of hypoxia  Outcome: Progressing Towards Goal    Electronically signed by Karie Fetch at 01/24/2021  7:50 PM EDT

## 2021-01-24 NOTE — ED Notes (Signed)
PVL at bedside

## 2021-01-24 NOTE — H&P (Signed)
Formatting of this note is different from the original.  Hospitalist Admission History and Physical    NAME:  [name] [name]   DOB:   [birth year]   MRN:   [id number]     PCP:  Wallene Huh, PA-C  Admission Date/Time:  01/24/2021 3:21 PM  Anticipated Date of Discharge: 01/27/2021  Anticipated Disposition (home, SNF) : HH     Assessment/Plan:     Active Problems:    Acute CHF (congestive heart failure) ([place]) (01/24/2021)        Assessment [name]   1.  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  Admit the patient to medicine service  Continue BiPAP  Try to titrate oxygen to try to bring it back to baseline keep sats more than 92%     COPD exacerbation  Oxygen, nebs, IV steroid    Acute deep venous thrombosis left upper extremity with occlusive thrombus in the subclavian vein  Vascular consulted, agreed to keep the patient on Eliquis which was started in the emergency room     chronic diastolic congestive heart failure  Diuresis started, given 40 in the emergency room, continue     Hypertensive urgency  Started on nitroglycerin drip, continue keep systolic blood pressure between 1 30-0 60, diastolic less than 762      Hyperlipidemia  Statin    Chronic pain    Type 2 diabetes mellitus uncontrolled with hyperglycemia  Insulin    Hypokalemia  Replace potassium    Risk of deterioration:  _0 Low    _1 Moderate  _2 High    Prophylaxis:  _3 Lovenox  _4 Coumadin  _5 Hep SQ  _6 SCD?s  _7 H2B/PPI_8 Eliquis _9 Xarelto     Disposition:  _10 Home w/ Family   _11 HH PT,OT,RN   _12 SNF/LTC   _13 SAH/Rehab        Subjective:   CHIEF COMPLAINT:    Chief Complaint   Patient presents with    Arm swelling    Congestive Heart Failure     HISTORY OF PRESENT ILLNESS:     [name] [name] is a 70 y.o. 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    DM2 (diabetes mellitus, type 2) ([facility])     GERD (gastroesophageal reflux disease)     Hepatitis C     HEP C    HTN (hypertension)     Lung nodule, multiple     (CT 10/2016) Small left upper lobe and 1.7 x 1.6 cm left lower lobe nodules not significantly changed from 07/23/2016 and 03/22/2016    Marijuana use     PTSD (post-traumatic stress disorder)     lived through the [place] in 1993    Thoracic ascending aortic aneurysm [facility])     4.4cm noted on CT Chest (10/2016)    Thyroid nodule     2.5 cm stable right thyroid nodule (CT 10/2016)       Past Surgical History:  Procedure Laterality Date    HX ENDOSCOPY      HX HERNIA REPAIR  2019    x2    HX HIP REPLACEMENT Right 01/29/2015    HX HIP REPLACEMENT      HX HYSTERECTOMY  2010    hysterectomy     Social History     Tobacco Use    Smoking status: Some Days    Smokeless tobacco: Never    Tobacco comments:     reported as quit smoking around 1990s per spouse   Substance Use Topics    Alcohol use: No       Family History   Problem Relation Age of Onset    Hypertension Mother     Other Mother         OSA    Cancer Father         suspected leukemia per pt's spouse    Cancer Maternal Aunt     Alcohol abuse Maternal Grandfather        Allergies   Allergen Reactions    Chocolate [Cocoa] Sneezing     Confirms allergy but reports "I still eat it"       Prior to Admission Medications   Prescriptions Last Dose Informant Patient Reported? Taking?   QUEtiapine SR (SEROQUEL XR) 150 mg sr tablet   Yes Yes   Sig: Take 150 mg by mouth nightly as needed.   acetaminophen (TYLENOL) 325 mg tablet   No Yes   Sig: Take 2 Tabs by mouth every four (4) hours as needed for Pain.   albuterol  (ACCUNEB) 1.25 mg/3 mL nebu   No Yes   Sig: USE 1 VIAL IN NEBULIZER EVERY 6 HOURS AS NEEDED FOR  SHORTNESS  OF  BREATH,  WHEEZING   albuterol (PROVENTIL HFA, VENTOLIN HFA, PROAIR HFA) 90 mcg/actuation inhaler   No Yes   Sig: Take 2 Puffs by inhalation every four (4) hours as needed for Wheezing.   allopurinoL (ZYLOPRIM) 100 mg tablet   No Yes   Sig: Take 1 Tablet by mouth two (2) times a day.   butalbital-acetaminophen-caffeine (FIORICET, ESGIC) 50-325-40 mg per tablet   Yes Yes   Sig: butalbital-acetaminophen-caffeine 50 mg-325 mg-40 mg tablet   TAKE 1 TABLET BY MOUTH EVERY 4 HOURS AS NEEDED FOR HEADACHE   cetirizine (ZYRTEC) 10 mg tablet   No Yes   Sig: Take 1 Tab by mouth daily.   cloNIDine HCl (CATAPRES) 0.1 mg tablet   No Yes   Sig: Take 3 Tabs by mouth two (2) times a day.   Patient taking differently: Take 0.2 mg by mouth two (2) times a day.   dilTIAZem CD (CARDIZEM CD) 240 mg ER capsule   No Yes   Sig: Take 1 Cap by mouth daily.   fluticasone propion-salmeterol (ADVAIR DISKUS) 100-50 mcg/dose diskus inhaler   No Yes   Sig: Take 1 Puff by inhalation two (2) times a day.   fluticasone propionate (FLONASE) 50 mcg/actuation nasal spray   No Yes   Sig: 2 Sprays by Both Nostrils route daily as needed for Rhinitis or Allergies. Indications: inflammation of the nose due to an allergy   furosemide (LASIX) 20 mg tablet   No Yes   Sig: Take 1 Tablet by mouth daily. Indications: fluid in the lungs due to chronic heart failure   Patient taking differently: Take 20 mg by mouth two (2) times a day. Indications: fluid in the lungs due to chronic heart failure  insulin aspart U-100 (NovoLOG U-100 Insulin aspart) 100 unit/mL injection   Yes Yes   Sig: 30 Units by SubCUTAneous route Before breakfast, lunch, and dinner.   insulin glargine (Lantus Solostar U-100 Insulin) 100 unit/mL (3 mL) inpn   Yes Yes   Sig: 40 Units by SubCUTAneous route nightly.   losartan (COZAAR) 50 mg tablet   No Yes   Sig: Take 1 Tab by mouth daily.    Patient taking differently: Take 100 mg by mouth in the morning.   magnesium oxide (MAG-OX) 400 mg tablet   Yes Yes   Sig: Take 400 mg by mouth daily.   metFORMIN ER (GLUCOPHAGE XR) 500 mg tablet   No Yes   Sig: TAKE 1 TABLET TWICE DAILY   montelukast (SINGULAIR) 10 mg tablet   No Yes   Sig: TAKE 1 TABLET EVERY DAY   polyethylene glycol (MIRALAX) 17 gram packet   No Yes   Sig: Take 1 Packet by mouth daily as needed for Constipation. Indications: constipation   potassium chloride SR (K-TAB) 20 mEq tablet   Yes Yes   Sig: potassium chloride ER 20 mEq tablet,extended release   TAKE 1 TABLET BY MOUTH ONCE DAILY   rosuvastatin (CRESTOR) 5 mg tablet   No Yes   Sig: TAKE 1 TABLET BY MOUTH NIGHTLY   tiotropium bromide (SPIRIVA RESPIMAT) 2.5 mcg/actuation inhaler   No Yes   Sig: Take 1 Puff by inhalation daily.     Facility-Administered Medications: None     REVIEW OF SYSTEMS:     _0  Unable to obtain  ROS due to  _1 mental status change  _2 sedated   _3 intubated   _4 Total of 12 systems reviewed as follows:  Constitutional: negative fever, negative chills, negative weight loss  Eyes:   negative visual changes  ENT:   negative sore throat, tongue or lip swelling  Respiratory:  Positive for shortness of breath  Cards:   negative for chest pain, palpitations, lower extremity edema  GI:   negative for nausea, vomiting, diarrhea, and abdominal pain  Genitourinary: negative for frequency, dysuria  Integument:  negative for rash and pruritus  Hematologic:  negative for easy bruising and gum/nose bleeding  Musculoskel: negative for myalgias,  back pain and muscle weakness  Neurological:  negative for headaches, dizziness, vertigo  Behavl/Psych: negative for feelings of anxiety, depression     Objective:   VITALS:    Visit Vitals  BP (!) 136/105   Pulse 95   Temp 98.7 F (37.1 C)   Resp 15   Ht _5  (1.702 m)   Wt 103.1 kg (227 lb 4.8 oz)   SpO2 100%   BMI 35.60 kg/m     Temp (24hrs), Avg:98.7 F (37.1 C), Min:98.7 F (37.1 C),  Max:98.7 F (37.1 C)    PHYSICAL EXAM: (Seen with PPE , gloves , gown , mask n95 and goggles )  General:    Alert, cooperative, no distress, appears stated age.     Head:   Normocephalic, without obvious abnormality, atraumatic.  Eyes:   Conjunctivae clear, anicteric sclerae.  Pupils are equal  Nose:  Nares normal. No drainage or sinus tenderness.  Throat:    Lips, mucosa, and tongue normal.  No Thrush  Neck:  Supple, symmetrical,  no adenopathy, thyroid: non tender    no carotid bruit and no JVD.  Back:    Symmetric,  No CVA tenderness.  Lungs:   Clear to auscultation bilaterally.  No Wheezing or Rhonchi. No  rales.  Chest wall:  No tenderness or deformity. No Accessory muscle use.  Heart:   Regular rate and rhythm,  no murmur, rub or gallop.  Abdomen:   Soft, non-tender. Not distended.  Bowel sounds normal. No masses  Extremities: Diffuse swelling over the left arm, left hand without any erythema, there is nonpitting edema, normal brachial and radial pulse    Skin:     As above  Psych:  Good insight.  Not depressed.  Not anxious or agitated.  Neurologic: EOMs intact. No facial asymmetry. No aphasia or slurred speech. Normal  strength, Alert and oriented X 3.     LAB DATA REVIEWED:    Recent Results (from the past 12 hour(s))   CBC WITH AUTOMATED DIFF    Collection Time: 01/24/21  8:20 AM   Result Value Ref Range    WBC 12.3 (H) 4.0 - 11.0 1000/mm3    RBC 4.52 3.60 - 5.20 M/uL    HGB 12.8 (L) 13.0 - 17.2 gm/dl    HCT 40.9 37.0 - 50.0 %    MCV 90.5 80.0 - 98.0 fL    MCH 28.3 25.4 - 34.6 pg    MCHC 31.3 30.0 - 36.0 gm/dl    PLATELET 196 140 - 450 1000/mm3    MPV 11.0 (H) 6.0 - 10.0 fL    RDW-SD 47.8 (H) 36.4 - 46.3      NRBC 0 0 - 0      IMMATURE GRANULOCYTES 0.7 0.0 - 3.0 %    NEUTROPHILS 80.8 (H) 34 - 64 %    LYMPHOCYTES 13.1 (L) 28 - 48 %    MONOCYTES 4.3 1 - 13 %    EOSINOPHILS 0.8 0 - 5 %    BASOPHILS 0.3 0 - 3 %   METABOLIC PANEL, COMPREHENSIVE    Collection Time: 01/24/21  8:20 AM   Result Value Ref Range     Potassium 3.3 (L) 3.5 - 5.1 mEq/L    Chloride 98 98 - 107 mEq/L    Sodium 140 136 - 145 mEq/L    CO2 36 (H) 20 - 31 mEq/L    Glucose 198 (H) 74 - 106 mg/dl    BUN 10 9 - 23 mg/dl    Creatinine 0.75 0.55 - 1.02 mg/dl    GFR est AA >60.0      GFR est non-AA >60      Calcium 9.3 8.7 - 10.4 mg/dl    Anion gap 6 5 - 15 mmol/L    AST (SGOT) 12.0 0.0 - 33.9 U/L    ALT (SGPT) <7 (L) 10 - 49 U/L    Alk. phosphatase 81 46 - 116 U/L    Bilirubin, total 0.50 0.30 - 1.20 mg/dl    Protein, total 7.6 5.7 - 8.2 gm/dl    Albumin 3.8 3.4 - 5.0 gm/dl   TROPONIN-HIGH SENSITIVITY    Collection Time: 01/24/21  8:20 AM   Result Value Ref Range    Troponin-High Sensitivity <3 0 - 59 ng/L   NT-PRO BNP    Collection Time: 01/24/21  8:20 AM   Result Value Ref Range    NT pro-BNP 27 0 - 125     POC BLOOD GAS + LACTIC ACID    Collection Time: 01/24/21  9:07 AM   Result Value Ref Range    pH 7.375 7.350 - 7.450      PCO2 69.3 (HH) 35.0 - 45.0 mm Hg    PO2 61.0 (L) 75 -  100 mm Hg    BICARBONATE 40.5 (HH) 18.0 - 26.0 mmol/L    O2 SAT 89.0 (L) 90 - 100 %    CO2, TOTAL 43.0 (H) 24 - 29 mmol/L    Lactic Acid 1.14 0.40 - 2.00 mmol/L    BASE EXCESS 15 (H) -2 - 3 mmol/L    Patient temp. 98.7 F      Sample type Art      SITE R Radial      DEVICE Nasal Can      ALLENS TEST Pass      Liters/min. 4      NOTIFIED Doctor     [facility] SENSITIVITY    Collection Time: 01/24/21 10:25 AM   Result Value Ref Range    Troponin-High Sensitivity <3 0 - 59 ng/L   TROPONIN-HIGH SENSITIVITY    Collection Time: 01/24/21 12:15 PM   Result Value Ref Range    Troponin-High Sensitivity 4 0 - 59 ng/L   POC BLOOD GAS + LACTIC ACID    Collection Time: 01/24/21  2:47 PM   Result Value Ref Range    pH 7.405 7.350 - 7.450      PCO2 66.3 (HH) 35.0 - 45.0 mm Hg    PO2 86.0 75 - 100 mm Hg    BICARBONATE 41.5 (HH) 18.0 - 26.0 mmol/L    O2 SAT 96.0 90 - 100 %    CO2, TOTAL 44.0 (H) 24 - 29 mmol/L    Lactic Acid 0.63 0.40 - 2.00 mmol/L    BASE EXCESS 17 (H) -2 - 3 mmol/L    Sample  type Art      FIO2 35      SITE R Brachial      DEVICE BiPAP      ALLENS TEST Pass      IPAP/PIP 22      EPAP/CPAP/PEEP 10      Respiratory Rate 19      SET RATE 12      VTEXP 479       IMAGING RESULTS:    XR CHEST SNGL V    Result Date: 01/24/2021  XR CHEST SNGL V Clinical indication: sob. Comparison: 01/24/2021 Support devices: None Lungs: Stable exam demonstrating lucency overlying the left upper lung field, compatible with chronic bullous changes. Unchanged rounded density overlying the left midlung field. Remainder the lungs are unchanged. Cardiomediastinal: Normal morphology for radiographic technique. Bones/soft tissues: No acute abnormalities.     IMPRESSION:  Stable exam.     XR CHEST SNGL V    Result Date: 01/24/2021  Exam: AP portable chest Clinical indication: Chest Pain Comparison: 05/26/2020;  08/22/2019; CT chest 08/30/2018;  Results:  Extensive lucency left hemithorax. Extensive costochondral calcifications. Persistent subtle opacity left mid chest approximately 6 cm in size, unchanged. No pneumothorax. Ectatic ascending aorta. Heart normal. No change prominence of the right hilum. No free air is seen under the hemidiaphragms.   Osseous structures intact.     IMPRESSION: 1. Severe emphysema left lung with stable opacity left mid chest representing segmental collapse, unchanged. 2. No acute change. 3. Mildly ectatic ascending aorta. No change.      Care Plan discussed with:     _0 Patient   _1 Family    _2 ED Care Manager  _3 ED Doc   _4 Specialist :    Total  critical care time spent on reviewing the case/data/notes/EMR,examining the patient,documentation,coordinating care with nurses and consultants is 40  minutes.     ___________________________________________________  Admitting  Physician: Charlaine Dalton, MD     Dragon medical dictation software was used for portions of this report.  Unintended voice transcription errors may have occurred.      Electronically signed by Charlaine Dalton, MD at 01/24/2021  4:53  PM EDT

## 2021-01-24 NOTE — Progress Notes (Signed)
Peripheral Vascular Lab:  Left upper extremity venous study preliminary report.      Acute deep venous thrombosis left upper extremity with occlusive thrombus in the subclavian vein  No evidence of deep venous thrombosis in the left internal jugular, axillary, brachial, radial and ulnar veins.        Final report to follow.  Darrall Dears, RVT

## 2021-01-24 NOTE — Unmapped (Signed)
Formatting of this note is different from the original.  TRANSFER - OUT REPORT:    Verbal report given to [facility], RN(name) on [employer]  being transferred to 3213(unit) for routine progression of care       Report consisted of patient?s Situation, Background, Assessment and   Recommendations(SBAR).     Information from the following report(s) SBAR, ED Summary, and MAR was reviewed with the receiving nurse.    Lines:   Peripheral IV 01/24/21 Right Antecubital (Active)   Site Assessment Clean, dry, & intact 01/24/21 0825   Phlebitis Assessment 0 01/24/21 0825   Infiltration Assessment 0 01/24/21 0825   Dressing Status Clean, dry, & intact 01/24/21 0825   Dressing Type Tape;Transparent 01/24/21 0825   Hub Color/Line Status Flushed 01/24/21 0825       Opportunity for questions and clarification was provided.      Patient transported with:   Monitor  O2 @ BiPap liters  Registered Nurse      Electronically signed by Marlowe Kays, RN at 01/24/2021  5:40 PM EDT

## 2021-01-24 NOTE — H&P (Signed)
Hospitalist Admission History and Physical    NAME:  [name] [name]   DOB:   1950-07-04   MRN:   [id number]     PCP:  Wallene Huh, PA-C  Admission Date/Time:  01/24/2021 3:21 PM  Anticipated Date of Discharge: 01/27/2021  Anticipated Disposition (home, SNF) : HH         Assessment/Plan:      Active Problems:    Acute CHF (congestive heart failure) ([place]) (01/24/2021)         Assessment [name]   1.  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  Admit the patient to medicine service  Continue BiPAP  Try to titrate oxygen to try to bring it back to baseline keep sats more than 92%       COPD exacerbation  Oxygen, nebs, IV steroid      Acute deep venous thrombosis left upper extremity with occlusive thrombus in the subclavian vein  Vascular consulted, agreed to keep the patient on Eliquis which was started in the emergency room     chronic diastolic congestive heart failure  Diuresis started, given 40 in the emergency room, continue     Hypertensive urgency  Started on nitroglycerin drip, continue keep systolic blood pressure between 1 80-8 60, diastolic less than 811      Hyperlipidemia  Statin    Chronic pain    Type 2 diabetes mellitus uncontrolled with hyperglycemia  Insulin    Hypokalemia  Replace potassium              Risk of deterioration:  '[]' Low    '[]' Moderate  '[x]' High    Prophylaxis:  '[]' Lovenox  '[]' Coumadin  '[]' Hep SQ  '[]' SCD???s  '[]' H2B/PPI'[x]' Eliquis '[]' Xarelto     Disposition:  '[]' Home w/ Family   '[x]' HH PT,OT,RN   '[]' SNF/LTC   '[]' SAH/Rehab             Subjective:   CHIEF COMPLAINT:    Chief Complaint   Patient presents with    Arm swelling    Congestive Heart Failure       HISTORY OF PRESENT ILLNESS:     [name] [name] is a 70 y.o. 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    DM2 (diabetes mellitus, type 2) ([facility])     GERD (gastroesophageal reflux disease)     Hepatitis C     HEP C    HTN (hypertension)     Lung nodule, multiple     (CT 10/2016) Small left upper lobe and 1.7 x 1.6 cm left lower lobe nodules not significantly changed from 07/23/2016 and 03/22/2016    Marijuana use     PTSD (post-traumatic stress disorder)     lived through the [place] in 1993    Thoracic ascending aortic aneurysm [facility])     4.4cm noted on CT Chest (10/2016)    Thyroid nodule  2.5 cm stable right thyroid nodule (CT 10/2016)        Past Surgical History:   Procedure Laterality Date    HX ENDOSCOPY      HX HERNIA REPAIR  2019    x2    HX HIP REPLACEMENT Right 01/29/2015    HX HIP REPLACEMENT      HX HYSTERECTOMY  2010    hysterectomy       Social History     Tobacco Use    Smoking status: Some Days    Smokeless tobacco: Never    Tobacco comments:     reported as quit smoking around 1990s per spouse   Substance Use Topics    Alcohol use: No        Family History   Problem Relation Age of Onset    Hypertension Mother     Other Mother         OSA    Cancer Father         suspected leukemia per pt's spouse    Cancer Maternal Aunt     Alcohol abuse Maternal Grandfather         Allergies   Allergen Reactions    Chocolate [Cocoa] Sneezing     Confirms allergy but reports "I still eat it"        Prior to Admission Medications   Prescriptions Last Dose Informant Patient Reported? Taking?   QUEtiapine SR (SEROQUEL XR) 150 mg sr tablet   Yes Yes   Sig: Take 150 mg by mouth nightly as needed.   acetaminophen (TYLENOL) 325 mg tablet   No Yes   Sig: Take 2 Tabs by mouth every four (4) hours as needed for Pain.   albuterol (ACCUNEB) 1.25 mg/3 mL  nebu   No Yes   Sig: USE 1 VIAL IN NEBULIZER EVERY 6 HOURS AS NEEDED FOR  SHORTNESS  OF  BREATH,  WHEEZING   albuterol (PROVENTIL HFA, VENTOLIN HFA, PROAIR HFA) 90 mcg/actuation inhaler   No Yes   Sig: Take 2 Puffs by inhalation every four (4) hours as needed for Wheezing.   allopurinoL (ZYLOPRIM) 100 mg tablet   No Yes   Sig: Take 1 Tablet by mouth two (2) times a day.   butalbital-acetaminophen-caffeine (FIORICET, ESGIC) 50-325-40 mg per tablet   Yes Yes   Sig: butalbital-acetaminophen-caffeine 50 mg-325 mg-40 mg tablet   TAKE 1 TABLET BY MOUTH EVERY 4 HOURS AS NEEDED FOR HEADACHE   cetirizine (ZYRTEC) 10 mg tablet   No Yes   Sig: Take 1 Tab by mouth daily.   cloNIDine HCl (CATAPRES) 0.1 mg tablet   No Yes   Sig: Take 3 Tabs by mouth two (2) times a day.   Patient taking differently: Take 0.2 mg by mouth two (2) times a day.   dilTIAZem CD (CARDIZEM CD) 240 mg ER capsule   No Yes   Sig: Take 1 Cap by mouth daily.   fluticasone propion-salmeterol (ADVAIR DISKUS) 100-50 mcg/dose diskus inhaler   No Yes   Sig: Take 1 Puff by inhalation two (2) times a day.   fluticasone propionate (FLONASE) 50 mcg/actuation nasal spray   No Yes   Sig: 2 Sprays by Both Nostrils route daily as needed for Rhinitis or Allergies. Indications: inflammation of the nose due to an allergy   furosemide (LASIX) 20 mg tablet   No Yes   Sig: Take 1 Tablet by mouth daily. Indications: fluid in the lungs due to chronic heart failure  Patient taking differently: Take 20 mg by mouth two (2) times a day. Indications: fluid in the lungs due to chronic heart failure   insulin aspart U-100 (NovoLOG U-100 Insulin aspart) 100 unit/mL injection   Yes Yes   Sig: 30 Units by SubCUTAneous route Before breakfast, lunch, and dinner.   insulin glargine (Lantus Solostar U-100 Insulin) 100 unit/mL (3 mL) inpn   Yes Yes   Sig: 40 Units by SubCUTAneous route nightly.   losartan (COZAAR) 50 mg tablet   No Yes   Sig: Take 1 Tab by mouth daily.   Patient taking  differently: Take 100 mg by mouth in the morning.   magnesium oxide (MAG-OX) 400 mg tablet   Yes Yes   Sig: Take 400 mg by mouth daily.   metFORMIN ER (GLUCOPHAGE XR) 500 mg tablet   No Yes   Sig: TAKE 1 TABLET TWICE DAILY   montelukast (SINGULAIR) 10 mg tablet   No Yes   Sig: TAKE 1 TABLET EVERY DAY   polyethylene glycol (MIRALAX) 17 gram packet   No Yes   Sig: Take 1 Packet by mouth daily as needed for Constipation. Indications: constipation   potassium chloride SR (K-TAB) 20 mEq tablet   Yes Yes   Sig: potassium chloride ER 20 mEq tablet,extended release   TAKE 1 TABLET BY MOUTH ONCE DAILY   rosuvastatin (CRESTOR) 5 mg tablet   No Yes   Sig: TAKE 1 TABLET BY MOUTH NIGHTLY   tiotropium bromide (SPIRIVA RESPIMAT) 2.5 mcg/actuation inhaler   No Yes   Sig: Take 1 Puff by inhalation daily.      Facility-Administered Medications: None           REVIEW OF SYSTEMS:     '[]'  Unable to obtain  ROS due to  '[]' mental status change  '[]' sedated   '[]' intubated   '[x]' Total of 12 systems reviewed as follows:  Constitutional: negative fever, negative chills, negative weight loss  Eyes:   negative visual changes  ENT:   negative sore throat, tongue or lip swelling  Respiratory:  Positive for shortness of breath  Cards:   negative for chest pain, palpitations, lower extremity edema  GI:   negative for nausea, vomiting, diarrhea, and abdominal pain  Genitourinary: negative for frequency, dysuria  Integument:  negative for rash and pruritus  Hematologic:  negative for easy bruising and gum/nose bleeding  Musculoskel: negative for myalgias,  back pain and muscle weakness  Neurological:  negative for headaches, dizziness, vertigo  Behavl/Psych: negative for feelings of anxiety, depression       Objective:   VITALS:    Visit Vitals  BP (!) 136/105   Pulse 95   Temp 98.7 ??F (37.1 ??C)   Resp 15   Ht '5\' 7"'  (1.702 m)   Wt 103.1 kg (227 lb 4.8 oz)   SpO2 100%   BMI 35.60 kg/m??     Temp (24hrs), Avg:98.7 ??F (37.1 ??C), Min:98.7 ??F (37.1 ??C), Max:98.7  ??F (37.1 ??C)      PHYSICAL EXAM: (Seen with PPE , gloves , gown , mask n95 and goggles )  General:    Alert, cooperative, no distress, appears stated age.     Head:   Normocephalic, without obvious abnormality, atraumatic.  Eyes:   Conjunctivae clear, anicteric sclerae.  Pupils are equal  Nose:  Nares normal. No drainage or sinus tenderness.  Throat:    Lips, mucosa, and tongue normal.  No Thrush  Neck:  Supple, symmetrical,  no adenopathy,  thyroid: non tender    no carotid bruit and no JVD.  Back:    Symmetric,  No CVA tenderness.  Lungs:   Clear to auscultation bilaterally.  No Wheezing or Rhonchi. No rales.  Chest wall:  No tenderness or deformity. No Accessory muscle use.  Heart:   Regular rate and rhythm,  no murmur, rub or gallop.  Abdomen:   Soft, non-tender. Not distended.  Bowel sounds normal. No masses  Extremities: Diffuse swelling over the left arm, left hand without any erythema, there is nonpitting edema, normal brachial and radial pulse    Skin:     As above  Psych:  Good insight.  Not depressed.  Not anxious or agitated.  Neurologic: EOMs intact. No facial asymmetry. No aphasia or slurred speech. Normal  strength, Alert and oriented X 3.       LAB DATA REVIEWED:    Recent Results (from the past 12 hour(s))   CBC WITH AUTOMATED DIFF    Collection Time: 01/24/21  8:20 AM   Result Value Ref Range    WBC 12.3 (H) 4.0 - 11.0 1000/mm3    RBC 4.52 3.60 - 5.20 M/uL    HGB 12.8 (L) 13.0 - 17.2 gm/dl    HCT 40.9 37.0 - 50.0 %    MCV 90.5 80.0 - 98.0 fL    MCH 28.3 25.4 - 34.6 pg    MCHC 31.3 30.0 - 36.0 gm/dl    PLATELET 196 140 - 450 1000/mm3    MPV 11.0 (H) 6.0 - 10.0 fL    RDW-SD 47.8 (H) 36.4 - 46.3      NRBC 0 0 - 0      IMMATURE GRANULOCYTES 0.7 0.0 - 3.0 %    NEUTROPHILS 80.8 (H) 34 - 64 %    LYMPHOCYTES 13.1 (L) 28 - 48 %    MONOCYTES 4.3 1 - 13 %    EOSINOPHILS 0.8 0 - 5 %    BASOPHILS 0.3 0 - 3 %   METABOLIC PANEL, COMPREHENSIVE    Collection Time: 01/24/21  8:20 AM   Result Value Ref Range     Potassium 3.3 (L) 3.5 - 5.1 mEq/L    Chloride 98 98 - 107 mEq/L    Sodium 140 136 - 145 mEq/L    CO2 36 (H) 20 - 31 mEq/L    Glucose 198 (H) 74 - 106 mg/dl    BUN 10 9 - 23 mg/dl    Creatinine 0.75 0.55 - 1.02 mg/dl    GFR est AA >60.0      GFR est non-AA >60      Calcium 9.3 8.7 - 10.4 mg/dl    Anion gap 6 5 - 15 mmol/L    AST (SGOT) 12.0 0.0 - 33.9 U/L    ALT (SGPT) <7 (L) 10 - 49 U/L    Alk. phosphatase 81 46 - 116 U/L    Bilirubin, total 0.50 0.30 - 1.20 mg/dl    Protein, total 7.6 5.7 - 8.2 gm/dl    Albumin 3.8 3.4 - 5.0 gm/dl   TROPONIN-HIGH SENSITIVITY    Collection Time: 01/24/21  8:20 AM   Result Value Ref Range    Troponin-High Sensitivity <3 0 - 59 ng/L   NT-PRO BNP    Collection Time: 01/24/21  8:20 AM   Result Value Ref Range    NT pro-BNP 27 0 - 125     POC BLOOD GAS + LACTIC ACID    Collection Time:  01/24/21  9:07 AM   Result Value Ref Range    pH 7.375 7.350 - 7.450      PCO2 69.3 (HH) 35.0 - 45.0 mm Hg    PO2 61.0 (L) 75 - 100 mm Hg    BICARBONATE 40.5 (HH) 18.0 - 26.0 mmol/L    O2 SAT 89.0 (L) 90 - 100 %    CO2, TOTAL 43.0 (H) 24 - 29 mmol/L    Lactic Acid 1.14 0.40 - 2.00 mmol/L    BASE EXCESS 15 (H) -2 - 3 mmol/L    Patient temp. 98.7 F      Sample type Art      SITE R Radial      DEVICE Nasal Can      ALLENS TEST Pass      Liters/min. 4      NOTIFIED Doctor     [facility] SENSITIVITY    Collection Time: 01/24/21 10:25 AM   Result Value Ref Range    Troponin-High Sensitivity <3 0 - 59 ng/L   TROPONIN-HIGH SENSITIVITY    Collection Time: 01/24/21 12:15 PM   Result Value Ref Range    Troponin-High Sensitivity 4 0 - 59 ng/L   POC BLOOD GAS + LACTIC ACID    Collection Time: 01/24/21  2:47 PM   Result Value Ref Range    pH 7.405 7.350 - 7.450      PCO2 66.3 (HH) 35.0 - 45.0 mm Hg    PO2 86.0 75 - 100 mm Hg    BICARBONATE 41.5 (HH) 18.0 - 26.0 mmol/L    O2 SAT 96.0 90 - 100 %    CO2, TOTAL 44.0 (H) 24 - 29 mmol/L    Lactic Acid 0.63 0.40 - 2.00 mmol/L    BASE EXCESS 17 (H) -2 - 3 mmol/L    Sample  type Art      FIO2 35      SITE R Brachial      DEVICE BiPAP      ALLENS TEST Pass      IPAP/PIP 22      EPAP/CPAP/PEEP 10      Respiratory Rate 19      SET RATE 12      VTEXP 479           IMAGING RESULTS:    XR CHEST SNGL V    Result Date: 01/24/2021  XR CHEST SNGL V Clinical indication: sob. Comparison: 01/24/2021 Support devices: None Lungs: Stable exam demonstrating lucency overlying the left upper lung field, compatible with chronic bullous changes. Unchanged rounded density overlying the left midlung field. Remainder the lungs are unchanged. Cardiomediastinal: Normal morphology for radiographic technique. Bones/soft tissues: No acute abnormalities.     IMPRESSION:  Stable exam.     XR CHEST SNGL V    Result Date: 01/24/2021  Exam: AP portable chest Clinical indication: Chest Pain Comparison: 05/26/2020;  08/22/2019; CT chest 08/30/2018;  Results:  Extensive lucency left hemithorax. Extensive costochondral calcifications. Persistent subtle opacity left mid chest approximately 6 cm in size, unchanged. No pneumothorax. Ectatic ascending aorta. Heart normal. No change prominence of the right hilum. No free air is seen under the hemidiaphragms.   Osseous structures intact.     IMPRESSION: 1. Severe emphysema left lung with stable opacity left mid chest representing segmental collapse, unchanged. 2. No acute change. 3. Mildly ectatic ascending aorta. No change.        Care Plan discussed with:     '[x]' Patient   '[]'   Family    '[]' ED Care Manager  '[]' ED Doc   '[]' Specialist :    Total  critical care time spent on reviewing the case/data/notes/EMR,examining the patient,documentation,coordinating care with nurses and consultants is 40  minutes.       ___________________________________________________  Admitting Physician: Charlaine Dalton, MD     Dragon medical dictation software was used for portions of this report.  Unintended voice transcription errors may have occurred.

## 2021-01-24 NOTE — Progress Notes (Signed)
Formatting of this note might be different from the original.  Bedside shift change report given to Harrison Mons, Charity fundraiser  (Cabin crew) by Lorelle Formosa, RN (offgoing nurse). Report included the following information SBAR, Kardex, Intake/Output, MAR, Recent Results, and Cardiac Rhythm SR .     Electronically signed by Alyssa Grove, RN at 01/24/2021  7:42 PM EDT

## 2021-01-24 NOTE — ED Notes (Signed)
Pt has been leaning on her left side since arrival, despite repositioning multiple times & instructions to try not to lean on L arm.

## 2021-01-24 NOTE — Progress Notes (Signed)
Pt transported to room 3213 on BIPAP from ER 23.

## 2021-01-24 NOTE — ED Notes (Signed)
Formatting of this note might be different from the original.  Pt has been having increased work of breathing, needing to sit with HOB at 90.  Family at bedside updated on plan of care.  MD notified of change in pt status.    Electronically signed by Marlowe Kays, RN at 01/24/2021  1:21 PM EDT

## 2021-01-24 NOTE — ED Provider Notes (Signed)
[facility]  Emergency Department Treatment Report        Patient: [name] [name] Age: 70 y.o. Sex: female    Date of Birth: 1951/03/17 Admit Date: 01/24/2021 PCP: Ernst Breach   MRN: [id number]  CSN: [id number]     Room: ER23/ER23 Time Dictated: 8:20 AM        Chief Complaint   Chief Complaint   Patient presents with    Arm swelling    Congestive Heart Failure        History of Present Illness   70 y.o. female past medical history of chronic pain COPD diabetes reflux hep C hypertension PTSD thoracic ascending aortic aneurysm who presents with shortness of breath.  Patient states that she has had shortness of breath worse with laying down for the past week it is associate with abdominal distention no leg swelling.  No chest pain no fevers no chills.  Patient also states that she has had left arm pain and swelling for 2 weeks.  No history of similar.  Does follow with Ortho for gout but states this is not similar.    Review of Systems   Review of Systems   Respiratory:  Positive for shortness of breath. Negative for cough and chest tightness.    Neurological:  Negative for seizures and numbness.   All other systems reviewed and are negative.     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    DM2 (diabetes mellitus, type 2) (HCC)     GERD (gastroesophageal reflux disease)     Hepatitis C     HEP C    HTN (hypertension)     Lung nodule, multiple     (CT 10/2016) Small left upper lobe and 1.7 x 1.6 cm left lower lobe nodules not significantly changed from 07/23/2016 and 03/22/2016    Marijuana use     PTSD (post-traumatic stress disorder)     lived through the [place] in 1993    Thoracic ascending aortic aneurysm [facility])     4.4cm noted on CT Chest (10/2016)    Thyroid nodule     2.5 cm stable  right thyroid nodule (CT 10/2016)     Past Surgical History:   Procedure Laterality Date    HX ENDOSCOPY      HX HERNIA REPAIR  2019    x2    HX HIP REPLACEMENT Right 01/29/2015    HX HIP REPLACEMENT      HX HYSTERECTOMY  2010    hysterectomy       Social History     Social History     Socioeconomic History    Marital status: MARRIED     Spouse name: Not on file    Number of children: Not on file    Years of education: Not on file    Highest education level: Not on file   Occupational History    Not on file   Tobacco Use    Smoking status: Some Days    Smokeless tobacco: Never    Tobacco comments:     reported as quit smoking around 1990s per spouse   Substance and Sexual Activity    Alcohol use: No  Drug use: Yes     Types: Marijuana     Comment: reports smoking last week-marijuana    Sexual activity: Never   Other Topics Concern    Not on file   Social History Narrative    Not on file     Social Determinants of Health     Financial Resource Strain: Not on file   Food Insecurity: Not on file   Transportation Needs: Not on file   Physical Activity: Not on file   Stress: Not on file   Social Connections: Not on file   Intimate Partner Violence: Not on file   Housing Stability: Not on file       Family History     Family History   Problem Relation Age of Onset    Hypertension Mother     Other Mother         OSA    Cancer Father         suspected leukemia per pt's spouse    Cancer Maternal Aunt     Alcohol abuse Maternal Grandfather        Current Medications     Prior to Admission Medications   Prescriptions Last Dose Informant Patient Reported? Taking?   QUEtiapine SR (SEROQUEL XR) 150 mg sr tablet   Yes No   Sig: Take 150 mg by mouth nightly as needed.   acetaminophen (TYLENOL) 325 mg tablet   No No   Sig: Take 2 Tabs by mouth every four (4) hours as needed for Pain.   Patient not taking: Reported on 05/26/2020   albuterol (ACCUNEB) 1.25 mg/3 mL nebu   No No   Sig: USE 1 VIAL IN NEBULIZER EVERY 6 HOURS AS NEEDED  FOR  SHORTNESS  OF  BREATH,  WHEEZING   albuterol (PROVENTIL HFA, VENTOLIN HFA, PROAIR HFA) 90 mcg/actuation inhaler   No No   Sig: Take 2 Puffs by inhalation every four (4) hours as needed for Wheezing.   allopurinoL (ZYLOPRIM) 100 mg tablet   No No   Sig: Take 1 Tablet by mouth two (2) times a day.   allopurinoL (ZYLOPRIM) 100 mg tablet   No No   Sig: Take 1 tablet by mouth twice daily   butalbital-acetaminophen-caffeine (FIORICET, ESGIC) 50-325-40 mg per tablet   Yes No   Sig: butalbital-acetaminophen-caffeine 50 mg-325 mg-40 mg tablet   TAKE 1 TABLET BY MOUTH EVERY 4 HOURS AS NEEDED FOR HEADACHE   cetirizine (ZYRTEC) 10 mg tablet   No No   Sig: Take 1 Tab by mouth daily.   cloNIDine HCl (CATAPRES) 0.1 mg tablet   No No   Sig: Take 3 Tabs by mouth two (2) times a day.   dilTIAZem CD (CARDIZEM CD) 240 mg ER capsule   No No   Sig: Take 1 Cap by mouth daily.   fluticasone propion-salmeterol (ADVAIR DISKUS) 100-50 mcg/dose diskus inhaler   No No   Sig: Take 1 Puff by inhalation two (2) times a day.   fluticasone propionate (FLONASE) 50 mcg/actuation nasal spray   No No   Sig: 2 Sprays by Both Nostrils route daily as needed for Rhinitis or Allergies. Indications: inflammation of the nose due to an allergy   furosemide (LASIX) 20 mg tablet   No No   Sig: Take 1 Tablet by mouth daily. Indications: fluid in the lungs due to chronic heart failure   losartan (COZAAR) 50 mg tablet   No No   Sig: Take 1 Tab  by mouth daily.   magnesium oxide (MAG-OX) 400 mg tablet   Yes No   Sig: Take 400 mg by mouth daily.   metFORMIN ER (GLUCOPHAGE XR) 500 mg tablet   No No   Sig: TAKE 1 TABLET TWICE DAILY   montelukast (SINGULAIR) 10 mg tablet   No No   Sig: TAKE 1 TABLET EVERY DAY   polyethylene glycol (MIRALAX) 17 gram packet   No No   Sig: Take 1 Packet by mouth daily as needed for Constipation. Indications: constipation   potassium chloride SR (K-TAB) 20 mEq tablet   Yes No   Sig: potassium chloride ER 20 mEq tablet,extended release    TAKE 1 TABLET BY MOUTH ONCE DAILY   predniSONE (DELTASONE) 10 mg tablet   No No   Sig: Take 2 tablets daily from 12/22-12/23, then decrease to 1 tablet daily for 3 days from 12/24-12/26, then stop.  Indications: worsening chronic obstructive pulmonary disease   rosuvastatin (CRESTOR) 5 mg tablet   No No   Sig: TAKE 1 TABLET BY MOUTH NIGHTLY   tiotropium bromide (SPIRIVA RESPIMAT) 2.5 mcg/actuation inhaler   No No   Sig: Take 1 Puff by inhalation daily.      Facility-Administered Medications: None       Allergies     Allergies   Allergen Reactions    Chocolate [Cocoa] Sneezing     Confirms allergy but reports "I still eat it"       Physical Exam     ED Triage Vitals   ED Encounter Vitals Group      BP       Pulse       Resp       Temp       Temp src       SpO2       Weight       Height        Physical Exam  Constitutional:       Appearance: She is well-developed.   HENT:      Head: Normocephalic and atraumatic.   Eyes:      Pupils: Pupils are equal, round, and reactive to light.   Cardiovascular:      Rate and Rhythm: Normal rate and regular rhythm.      Heart sounds: Normal heart sounds. No murmur heard.    No friction rub.   Pulmonary:      Effort: Pulmonary effort is normal. No respiratory distress.      Breath sounds: Normal breath sounds. No wheezing.   Abdominal:      General: There is no distension.      Palpations: Abdomen is soft.      Tenderness: no abdominal tenderness There is no guarding or rebound.      Comments: Abdominal distention without pain   Musculoskeletal:         General: Normal range of motion.      Cervical back: Normal range of motion and neck supple.   Skin:     General: Skin is warm and dry.      Comments: Left diffuse swelling to the left hand without erythema nonpitting edema   Neurological:      Mental Status: She is alert and oriented to person, place, and time.   Psychiatric:         Behavior: Behavior normal.         Thought Content: Thought content normal.  Impression and  Management Plan   EKG per my read shows sinus at 103 with a normal axis normal intervals there is no ST elevation or depression no hypertrophy.    Patient presents with worsening shortness of breath worse with laying down certainly suggestive of heart failure associate with left hand swelling less than abdominal distention does have a history of a thoracic aortic aneurysm also some pulmonary nodules.  We will do a duplex of the left hand/arm as well as check basic labs including BNP and a chest x-ray to evaluate for CHF.    Patient's became short of breath diaphoretic tripoding.  Started on nitro drip as well as BiPAP.  Now doing much better.  We will repeat x-ray discussed with Dr. Winona Legato for the hospitalist who accepts admission.  Also discussed with ICU based on the patient's improvement will admit to stepdown.    Diagnostic Studies   Lab:   Recent Results (from the past 12 hour(s))   CBC WITH AUTOMATED DIFF    Collection Time: 01/24/21  8:20 AM   Result Value Ref Range    WBC 12.3 (H) 4.0 - 11.0 1000/mm3    RBC 4.52 3.60 - 5.20 M/uL    HGB 12.8 (L) 13.0 - 17.2 gm/dl    HCT 40.9 37.0 - 50.0 %    MCV 90.5 80.0 - 98.0 fL    MCH 28.3 25.4 - 34.6 pg    MCHC 31.3 30.0 - 36.0 gm/dl    PLATELET 196 140 - 450 1000/mm3    MPV 11.0 (H) 6.0 - 10.0 fL    RDW-SD 47.8 (H) 36.4 - 46.3      NRBC 0 0 - 0      IMMATURE GRANULOCYTES 0.7 0.0 - 3.0 %    NEUTROPHILS 80.8 (H) 34 - 64 %    LYMPHOCYTES 13.1 (L) 28 - 48 %    MONOCYTES 4.3 1 - 13 %    EOSINOPHILS 0.8 0 - 5 %    BASOPHILS 0.3 0 - 3 %   METABOLIC PANEL, COMPREHENSIVE    Collection Time: 01/24/21  8:20 AM   Result Value Ref Range    Potassium 3.3 (L) 3.5 - 5.1 mEq/L    Chloride 98 98 - 107 mEq/L    Sodium 140 136 - 145 mEq/L    CO2 36 (H) 20 - 31 mEq/L    Glucose 198 (H) 74 - 106 mg/dl    BUN 10 9 - 23 mg/dl    Creatinine 0.75 0.55 - 1.02 mg/dl    GFR est AA >60.0      GFR est non-AA >60      Calcium 9.3 8.7 - 10.4 mg/dl    Anion gap 6 5 - 15 mmol/L    AST (SGOT) 12.0 0.0 -  33.9 U/L    ALT (SGPT) <7 (L) 10 - 49 U/L    Alk. phosphatase 81 46 - 116 U/L    Bilirubin, total 0.50 0.30 - 1.20 mg/dl    Protein, total 7.6 5.7 - 8.2 gm/dl    Albumin 3.8 3.4 - 5.0 gm/dl   TROPONIN-HIGH SENSITIVITY    Collection Time: 01/24/21  8:20 AM   Result Value Ref Range    Troponin-High Sensitivity <3 0 - 59 ng/L   NT-PRO BNP    Collection Time: 01/24/21  8:20 AM   Result Value Ref Range    NT pro-BNP 27 0 - 125     POC BLOOD GAS + LACTIC  ACID    Collection Time: 01/24/21  9:07 AM   Result Value Ref Range    pH 7.375 7.350 - 7.450      PCO2 69.3 (HH) 35.0 - 45.0 mm Hg    PO2 61.0 (L) 75 - 100 mm Hg    BICARBONATE 40.5 (HH) 18.0 - 26.0 mmol/L    O2 SAT 89.0 (L) 90 - 100 %    CO2, TOTAL 43.0 (H) 24 - 29 mmol/L    Lactic Acid 1.14 0.40 - 2.00 mmol/L    BASE EXCESS 15 (H) -2 - 3 mmol/L    Patient temp. 98.7 F      Sample type Art      SITE R Radial      DEVICE Nasal Can      ALLENS TEST Pass      Liters/min. 4      NOTIFIED Doctor     TROPONIN-HIGH SENSITIVITY    Collection Time: 01/24/21 10:25 AM   Result Value Ref Range    Troponin-High Sensitivity <3 0 - 59 ng/L   TROPONIN-HIGH SENSITIVITY    Collection Time: 01/24/21 12:15 PM   Result Value Ref Range    Troponin-High Sensitivity 4 0 - 59 ng/L     Labs Reviewed   CBC WITH AUTOMATED DIFF - Abnormal; Notable for the following components:       Result Value    WBC 12.3 (*)     HGB 12.8 (*)     MPV 11.0 (*)     RDW-SD 47.8 (*)     NEUTROPHILS 80.8 (*)     LYMPHOCYTES 13.1 (*)     All other components within normal limits   METABOLIC PANEL, COMPREHENSIVE - Abnormal; Notable for the following components:    Potassium 3.3 (*)     CO2 36 (*)     Glucose 198 (*)     ALT (SGPT) <7 (*)     All other components within normal limits   POC BLOOD GAS + LACTIC ACID - Abnormal; Notable for the following components:    PCO2 69.3 (*)     PO2 61.0 (*)     BICARBONATE 40.5 (*)     O2 SAT 89.0 (*)     CO2, TOTAL 43.0 (*)     BASE EXCESS 15 (*)     All other components within  normal limits   TROPONIN-HIGH SENSITIVITY   NT-PRO BNP   BLOOD GAS, ARTERIAL   TROPONIN-HIGH SENSITIVITY   TROPONIN-HIGH SENSITIVITY   BLOOD GAS, ARTERIAL       Imaging:    XR CHEST SNGL V    Result Date: 01/24/2021  XR CHEST SNGL V Clinical indication: sob. Comparison: 01/24/2021 Support devices: None Lungs: Stable exam demonstrating lucency overlying the left upper lung field, compatible with chronic bullous changes. Unchanged rounded density overlying the left midlung field. Remainder the lungs are unchanged. Cardiomediastinal: Normal morphology for radiographic technique. Bones/soft tissues: No acute abnormalities.     IMPRESSION:  Stable exam.     XR CHEST SNGL V    Result Date: 01/24/2021  Exam: AP portable chest Clinical indication: Chest Pain Comparison: 05/26/2020;  08/22/2019; CT chest 08/30/2018;  Results:  Extensive lucency left hemithorax. Extensive costochondral calcifications. Persistent subtle opacity left mid chest approximately 6 cm in size, unchanged. No pneumothorax. Ectatic ascending aorta. Heart normal. No change prominence of the right hilum. No free air is seen under the hemidiaphragms.   Osseous structures intact.  IMPRESSION: 1. Severe emphysema left lung with stable opacity left mid chest representing segmental collapse, unchanged. 2. No acute change. 3. Mildly ectatic ascending aorta. No change.      ED Course     Patient Vitals for the past 12 hrs:   Temp Pulse Resp BP SpO2   01/24/21 1406 -- (!) 101 -- (!) 145/95 --   01/24/21 1346 -- 96 19 (!) 165/91 91 %   01/24/21 1342 -- 95 -- (!) 196/112 --   01/24/21 1338 -- -- -- -- 98 %   01/24/21 1331 -- (!) 103 25 (!) 196/112 (!) 89 %   01/24/21 1316 -- (!) 112 (!) 32 (!) 184/101 98 %   01/24/21 1301 -- (!) 112 24 (!) 197/127 (!) 86 %   01/24/21 1232 -- 92 22 (!) 158/92 95 %   01/24/21 0858 -- -- -- -- 91 %   01/24/21 0856 -- 99 -- (!) 171/105 91 %   01/24/21 0833 -- 98 -- (!) 166/96 --   01/24/21 0832 98.7 ??F (37.1 ??C) -- -- -- --   01/24/21  0826 -- (!) 103 23 (!) 166/96 90 %   01/24/21 0813 -- (!) 102 -- (!) 174/120 95 %            Medications   nitroGLYcerin (TRIDIL) 400 mcg/ml infusion (10 mcg/min IntraVENous Rate Change 01/24/21 1406)   sodium chloride (NS) flush 5-10 mL (10 mL IntraVENous Given 01/24/21 0833)   furosemide (LASIX) injection 40 mg (40 mg IntraVENous Given 01/24/21 6629)   acetaminophen (TYLENOL) tablet 1,000 mg (1,000 mg Oral Given 01/24/21 0858)   apixaban (ELIQUIS) tablet 10 mg (10 mg Oral Given 01/24/21 1258)       Medical Decision Making     Critical Care Time:  The services I provided to this patient were to treat and/or prevent clinically significant deterioration that could result in the failure of one or more body systems and/or organ systems due to CHF .    Services included the following:  -reviewing nursing notes and old charts  -vital sign assessments  -direct patient care  -medication orders and management  -interpreting and reviewing diagnostic studies/labs  -re-evaluations  -documentation time    Aggregate critical care time was 90  minutes, which includes only time during which I was engaged in work directly related to the patient's care as described above, whether I was at bedside or elsewhere in the Emergency Department. It did not include time spent performing other reported procedures or the services of residents, students, nurses, or advance practice providers.       Jacinto Reap, MD;Ver*    2:31 PM    Case discussion with Dr. Winona Legato for the hospitalist service who accepts admission and agrees with plan.    Case discussed with Dr. Posey Pronto from cardiology who agrees to consult.      Final Diagnosis   No diagnosis found.      Disposition   Admit         Jacinto Reap, MD  January 24, 2021  8:20 AM      My signature above authenticates this document and my orders, the final    diagnosis (es), discharge prescription (s), and instructions in the Epic    record.  If you have any questions please contact [phone].      Nursing notes have been reviewed by the physician/ advanced practice    Clinician.    Dragon medical dictation software was  used for portions of this report. Unintended voice recognition errors may occur.

## 2021-01-24 NOTE — ED Triage Notes (Signed)
Formatting of this note might be different from the original.  Pt brought in by EMS from home with c/o L arm pain & swelling x2 weeks, stating "I think I just slept on it wrong."  Pt also c/o increased SOB x3 wks.  Pt is normally on 3L O2 via NC at home, with EMS finding O2 sat = 81%.  Pt placed on NRB by EMS.   Electronically signed by Marlowe Kays, RN at 01/24/2021  8:25 AM EDT

## 2021-01-24 NOTE — ED Notes (Signed)
Formatting of this note might be different from the original.  PVL at bedside  Electronically signed by Marlowe Kays, RN at 01/24/2021  9:33 AM EDT

## 2021-01-25 ENCOUNTER — Inpatient Hospital Stay: Payer: MEDICARE | Primary: Physician Assistant

## 2021-01-25 LAB — CBC WITH AUTO DIFFERENTIAL
Basophils %: 0.1 % (ref 0–3)
Eosinophils %: 0 % (ref 0–5)
Hematocrit: 41.8 % (ref 37.0–50.0)
Hemoglobin: 13 gm/dl (ref 13.0–17.2)
Immature Granulocytes: 0.6 % (ref 0.0–3.0)
Lymphocytes %: 5.1 % — ABNORMAL LOW (ref 28–48)
MCH: 27.9 pg (ref 25.4–34.6)
MCHC: 31.1 gm/dl (ref 30.0–36.0)
MCV: 89.7 fL (ref 80.0–98.0)
MPV: 11.4 fL — ABNORMAL HIGH (ref 6.0–10.0)
Monocytes %: 0.7 % — ABNORMAL LOW (ref 1–13)
Neutrophils %: 93.5 % — ABNORMAL HIGH (ref 34–64)
Nucleated RBCs: 0 (ref 0–0)
Platelets: 232 10*3/uL (ref 140–450)
RBC: 4.66 M/uL (ref 3.60–5.20)
RDW-SD: 46.6 — ABNORMAL HIGH (ref 36.4–46.3)
WBC: 12.3 10*3/uL — ABNORMAL HIGH (ref 4.0–11.0)

## 2021-01-25 LAB — POC BLOOD GAS + LACTIC ACID
BASE EXCESS: 14 mmol/L — ABNORMAL HIGH (ref <=3)
BICARBONATE: 38.2 mmol/L — CR (ref 18.0–26.0)
Base Excess: 14 mmol/L — ABNORMAL HIGH (ref <=3)
CO2 Total: 40 mmol/L — ABNORMAL HIGH (ref 24–29)
CO2, TOTAL: 40 mmol/L — ABNORMAL HIGH (ref 24–29)
EPAP/CPAP/PEEP: 10
EPAP/CPAP/PEEP: 10
FIO2: 35
FIO2: 35
HCO3: 38.2 mmol/L (ref 18.0–26.0)
IPAP/PIP: 22
IPAP/PIP: 22
LACTIC ACID: 2.1 mmol/L — ABNORMAL HIGH (ref 0.40–2.00)
Lactic Acid: 2.1 mmol/L — ABNORMAL HIGH (ref 0.40–2.00)
O2 SAT: 96 % (ref 90–100)
O2 Sat: 96 % (ref 90–100)
PCO2: 58.9 mm Hg — ABNORMAL HIGH (ref 35.0–45.0)
PCO2: 58.9 mm Hg — ABNORMAL HIGH (ref 35.0–45.0)
PO2: 82 mm Hg (ref 75–100)
PO2: 82 mm Hg (ref 75–100)
Patient temp.: 98.7
Patient temperature: 98.7
RESPIRATORY RATE: 17
Respiratory Rate: 17
SET RATE: 12
Set Rate: 12
VTEXP, VTEXP: 452
VTEXP: 452
pH: 7.42 (ref 7.350–7.450)
pH: 7.42 (ref 7.350–7.450)

## 2021-01-25 LAB — BASIC METABOLIC PANEL
Anion Gap: 10 mmol/L (ref 5–15)
BUN: 15 mg/dl (ref 9–23)
CO2: 34 mEq/L — ABNORMAL HIGH (ref 20–31)
Calcium: 9.4 mg/dl (ref 8.7–10.4)
Chloride: 96 mEq/L — ABNORMAL LOW (ref 98–107)
Creatinine: 0.74 mg/dl (ref 0.55–1.02)
EGFR IF NonAfrican American: >60
GFR African American: >60
Glucose: 213 mg/dl — ABNORMAL HIGH (ref 74–106)
Potassium: 3.8 mEq/L (ref 3.5–5.1)
Sodium: 140 mEq/L (ref 136–145)

## 2021-01-25 LAB — POCT GLUCOSE
POC Glucose: 220 mg/dL — ABNORMAL HIGH (ref 65–105)
POC Glucose: 258 mg/dL — ABNORMAL HIGH (ref 65–105)
POC Glucose: 309 mg/dL — ABNORMAL HIGH (ref 65–105)
POC Glucose: 244 mg/dL — ABNORMAL HIGH (ref 65–105)

## 2021-01-25 LAB — CBC WITH AUTOMATED DIFF
BASOPHILS: 0.1 % (ref 0–3)
EOSINOPHILS: 0 % (ref 0–5)
HCT: 41.8 % (ref 37.0–50.0)
HGB: 13 gm/dl (ref 13.0–17.2)
IMMATURE GRANULOCYTES: 0.6 % (ref 0.0–3.0)
LYMPHOCYTES: 5.1 % — ABNORMAL LOW (ref 28–48)
MCH: 27.9 pg (ref 25.4–34.6)
MCHC: 31.1 gm/dl (ref 30.0–36.0)
MCV: 89.7 fL (ref 80.0–98.0)
MONOCYTES: 0.7 % — ABNORMAL LOW (ref 1–13)
MPV: 11.4 fL — ABNORMAL HIGH (ref 6.0–10.0)
NEUTROPHILS: 93.5 % — ABNORMAL HIGH (ref 34–64)
NRBC: 0 (ref 0–0)
PLATELET: 232 10*3/uL (ref 140–450)
RBC: 4.66 M/uL (ref 3.60–5.20)
RDW-SD: 46.6 — ABNORMAL HIGH (ref 36.4–46.3)
WBC: 12.3 10*3/uL — ABNORMAL HIGH (ref 4.0–11.0)

## 2021-01-25 LAB — METABOLIC PANEL, BASIC
Anion gap: 10 mmol/L (ref 5–15)
BUN: 15 mg/dl (ref 9–23)
CO2: 34 mEq/L — ABNORMAL HIGH (ref 20–31)
Calcium: 9.4 mg/dl (ref 8.7–10.4)
Chloride: 96 mEq/L — ABNORMAL LOW (ref 98–107)
Creatinine: 0.74 mg/dl (ref 0.55–1.02)
GFR est AA: >60
GFR est non-AA: >60
Glucose: 213 mg/dl — ABNORMAL HIGH (ref 74–106)
Potassium: 3.8 mEq/L (ref 3.5–5.1)
Sodium: 140 mEq/L (ref 136–145)

## 2021-01-25 LAB — GLUCOSE, POC
Glucose (POC): 220 mg/dL — ABNORMAL HIGH (ref 65–105)
Glucose (POC): 258 mg/dL — ABNORMAL HIGH (ref 65–105)
Glucose (POC): 309 mg/dL — ABNORMAL HIGH (ref 65–105)
Glucose (POC): 244 mg/dL — ABNORMAL HIGH (ref 65–105)

## 2021-01-25 MED ORDER — AMLODIPINE 5 MG TAB
5 mg | Freq: Every day | ORAL | Status: DC
Start: 2021-01-25 — End: 2021-01-28
  Administered 2021-01-25 – 2021-01-28 (×4): via ORAL

## 2021-01-25 MED ORDER — TIOTROPIUM BROMIDE 2.5 MCG/ACTUATION MIST FOR INHALATION
2.5 mcg/actuation | Freq: Every day | RESPIRATORY_TRACT | Status: DC
Start: 2021-01-25 — End: 2021-01-28
  Administered 2021-01-26 – 2021-01-27 (×2): via RESPIRATORY_TRACT

## 2021-01-25 MED ORDER — METHYLPREDNISOLONE (PF) 40 MG/ML IJ SOLR
40 mg/mL | Freq: Two times a day (BID) | INTRAMUSCULAR | Status: DC
Start: 2021-01-25 — End: 2021-01-27
  Administered 2021-01-26 – 2021-01-27 (×4): via INTRAVENOUS

## 2021-01-25 MED ORDER — CLONIDINE 0.2 MG TAB
0.2 mg | Freq: Three times a day (TID) | ORAL | Status: DC
Start: 2021-01-25 — End: 2021-01-28
  Administered 2021-01-25 – 2021-01-28 (×11): via ORAL

## 2021-01-25 MED ORDER — LOSARTAN-HYDROCHLOROTHIAZIDE 50 MG-12.5 MG TAB
Freq: Every day | ORAL | Status: DC
Start: 2021-01-25 — End: 2021-01-26
  Administered 2021-01-25 – 2021-01-26 (×2): via ORAL

## 2021-01-25 MED FILL — ELIQUIS 5 MG TABLET: 5 mg | ORAL | Qty: 2

## 2021-01-25 MED FILL — ADMELOG U-100 INSULIN LISPRO 100 UNIT/ML SUBCUTANEOUS SOLUTION: 100 unit/mL | SUBCUTANEOUS | Qty: 1

## 2021-01-25 MED FILL — SPIRIVA RESPIMAT 2.5 MCG/ACTUATION SOLUTION FOR INHALATION: 2.5 mcg/actuation | RESPIRATORY_TRACT | Qty: 4

## 2021-01-25 MED FILL — MONTELUKAST 10 MG TAB: 10 mg | ORAL | Qty: 1

## 2021-01-25 MED FILL — ALLOPURINOL 100 MG TAB: 100 mg | ORAL | Qty: 1

## 2021-01-25 MED FILL — ROSUVASTATIN 5 MG TAB: 5 mg | ORAL | Qty: 1

## 2021-01-25 MED FILL — LOSARTAN-HYDROCHLOROTHIAZIDE 50 MG-12.5 MG TAB: ORAL | Qty: 2

## 2021-01-25 MED FILL — SOLU-MEDROL (PF) 40 MG/ML SOLUTION FOR INJECTION: 40 mg/mL | INTRAMUSCULAR | Qty: 1

## 2021-01-25 MED FILL — AMLODIPINE 5 MG TAB: 5 mg | ORAL | Qty: 1

## 2021-01-25 MED FILL — TYLENOL 325 MG TABLET: 325 mg | ORAL | Qty: 2

## 2021-01-25 MED FILL — LOSARTAN 50 MG TAB: 50 mg | ORAL | Qty: 2

## 2021-01-25 MED FILL — CLONIDINE 0.2 MG TAB: 0.2 mg | ORAL | Qty: 1

## 2021-01-25 NOTE — Consults (Signed)
Consults  by Duane Lopeuggiero, Deshanda Molitor, PA at 01/25/21 [id number]                Author: Duane Lopeuggiero, Brett Darko, PA  Service: Cardiology  Author Type: Physician Assistant       Filed: 01/25/21 0958  Date of Service: 01/25/21 0923  Status: Attested           Editor: Duane Lopeuggiero, Lili Harts, PA (Physician Assistant)  Cosigner: Burgess EstelleMcCray, Robert D, MD at 01/25/21 1154          Attestation signed by Burgess EstelleMcCray, Robert D, MD at 01/25/21 1154          I have independently interviewed and examined the patient. I have personally reviewed recent labs, imaging and cardiac studies. I agree with the assessment  and plan as detailed in the practitioner's note which was jointly formulated.      This is a 70 year old female presenting with 2 weeks of left upper extremity swelling and was found to have an acute obstructive DVT.  She was also noted to have hypertensive urgency and hypoxemic respiratory failure requiring noninvasive positive pressure  ventilation.  I cannot find her EKG in [facility] web although it has been ordered.  Her telemetry thus far has revealed sinus rhythm.  I personally reviewed her chest x-ray which revealed hypodensity over the left lung field suggestive of bullous emphysema  with unchanged rounded density in the left midlung field.  There was no evidence of edema.  The patient has been placed on anticoagulation for DVT.  A CT scan was ordered to evaluate for pulmonary embolism but has yet to be performed presumably due to  respiratory status.  The high-sensitivity troponin is below the level of detection.  The NT proBNP is 27.  Both of these suggest that her presentation is inconsistent with acute congestive heart failure.  This seems more consistent with exacerbation of  COPD.  We will help control her blood pressure.  If blood pressure does not improve on current regimen then consider adding labetalol for additional antihypertensive therapy.      Burgess Estelleobert D McCray, MD   January 25, 2021, 11:54 AM   Pager [phone]   [facility]                                 [facility] Consultation Note         Anne ShutterMonica R Seckman 70 y.o.   DOB: 04/02/1951   MRN: [id number]   SSN: [id number]         PCP: Lennox GrumblesBrock, Monique, PA-C      DOA: 01/24/2021       Reason for consult: Acute on Chronic HFpEF & HTN Urgency   Requesting physician: ED Physician   Outpatient cardiologist: None        Impression:     Acute DVT of LUE    PVL of LUE (V): +occlusive thrombus in SC V.    Acute on Chronic Hypercapnic Hypoxic Respiratory Failure    Currently on NIPPV    Home 02 use   AECOPD   Hypokalemia   HTN Urgency    Acute on Chronic HFpEF    Troponin HS: < 3 x 2 - 4    BNP 27    Echo 05/2019: EF 63%.  Grade 1 DD.  No signif valve Dz   Chronic Pain   DM, type 2   Bullous emphysema    H/o L PTX  S/p IR placed L Chest tube (08/2018)   Morbid Obesity         Recommendations:     1.  Acute LUE DVT.  Agree with DOAC      2. HTN Urgency.  Agree with NTG gtt, Norvasc, Clonidine & Hyzaar.  Would increase Norvasc to 10 mg PO daily as well as titrate Hyzaar up to 100-25 mg PO daily.  Wean NTG gtt as BP allows.  Could also consider Coreg 12.5 mg PO BID      3. Acute on Chronic HFpEF.  S/p Lasix IVP x 1.  BNP & CE's noted.  Would hold on further diuretics      4. Acute on Chronic Hypercapnic Hypoxic Respiratory Failure.  Noted plans for CTA Chest.  Wean NIPPV as tol.  Further Mgmt per Primary team      5. Hypokalemia.  Replaced, aim for K+ > 4      Final Recommendations per Cardiologist       HPI:       [name] [name] is a 70 y.o. female who is being seen in cardiology consultation for Acute HFpEF & HTN urgency.  Presented to ED yesterday with C/o dyspnea, orthopnea, PND, abdominal edema x 3 weeks as well as LUE pain/edema x 2 weeks.  Denies any chest  pain.  ED workup included labs (remarkable for WBC 12.3 K, Hgb 12.8, K+ 3.3, Glucose 198, Cr 0.75, Troponin HS < 3 x 2 & BNP 27), imaging (CXR = volume overloaded) & EKG (Not found).  Admitted along with DOAC & NTG  gtt began & Cardiology consulted.  Upon  arrival to see patient, A&O x 3.  On NIPPV, states breathing has improved with this.  States she uses home 02 with 02 sat's at home always >/= 90%.  Denies any chest pain/pressure/tightness.  States compliance with anti-HTN meds at home with "bp good  at home".                Patient Active Problem List           Diagnosis  Date Noted         ?  Acute CHF (congestive heart failure) (HCC)  01/24/2021     ?  UTI (urinary tract infection)  06/01/2019     ?  COPD exacerbation (HCC)  05/07/2019     ?  Acute hypoxemic respiratory failure (HCC)  05/07/2019     ?  Chronic respiratory failure with hypoxia (HCC)  08/22/2018     ?  Headache  08/05/2018     ?  Abdominal pain  02/04/2018     ?  SIRS (systemic inflammatory response syndrome) (HCC)  10/09/2017     ?  Hyperkalemia  09/08/2017     ?  Obtunded  09/08/2017     ?  Acute renal failure (ARF) (HCC)  09/08/2017     ?  Septic shock (HCC)  09/08/2017     ?  Metabolic encephalopathy  09/08/2017     ?  Dehydration  08/07/2017     ?  COPD with acute exacerbation (HCC)  08/07/2017     ?  Acute-on-chronic kidney injury (HCC)  08/07/2017     ?  Chest pain  06/15/2017     ?  Dyspnea  06/15/2017     ?  Type 2 diabetes mellitus with diabetic neuropathy (HCC)  12/07/2016     ?  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  Cannabinoid hyperemesis syndrome  08/17/2016     ?  Gastritis  08/16/2016     ?  Nausea & vomiting  08/16/2016     ?  Asthma with acute exacerbation  08/04/2016     ?  Lactic acidosis  07/24/2016     ?  Leukocytosis  07/24/2016     ?  Sepsis (HCC)  07/24/2016     ?  Asthma exacerbation  07/24/2016     ?  Type 2 diabetes mellitus with nephropathy (HCC)  06/12/2016     ?  Sacroiliitis (HCC)  11/25/2015     ?  Spondylosis of lumbar region without myelopathy or radiculopathy  09/15/2015     ?  Lumbar and sacral osteoarthritis  09/15/2015     ?  Chronic pain syndrome  09/15/2015     ?  Uncontrolled type 2 diabetes mellitus with  hyperglycemia (HCC)  08/20/2015     ?  Acute colitis  07/02/2015     ?  Acute hyperglycemia  07/02/2015     ?  Accelerated hypertension  04/08/2015     ?  Severe headache  04/07/2015     ?  Osteoarthritis of hips, bilateral  01/29/2015     ?  PTSD (post-traumatic stress disorder)  01/29/2015         ?  Severe hypertension  07/21/2014                   Past Medical History:        Diagnosis  Date         ?  Arthritis       ?  Chronic pain syndrome            related to R hip replacement         ?  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  DM2 (diabetes mellitus, type 2) (HCC)       ?  GERD (gastroesophageal reflux disease)       ?  Hepatitis C            HEP C         ?  HTN (hypertension)       ?  Lung nodule, multiple            (CT 10/2016) Small left upper lobe and 1.7 x 1.6 cm left lower lobe nodules not significantly changed from 07/23/2016 and 03/22/2016         ?  Marijuana use       ?  PTSD (post-traumatic stress disorder)            lived through the [place]         ?  Thoracic ascending aortic aneurysm (HCC)            4.4cm noted on CT Chest (10/2016)         ?  Thyroid nodule            2.5 cm stable right thyroid nodule (CT 10/2016)          Past Surgical History:         Procedure  Laterality  Date          ?  HX ENDOSCOPY         ?  HX HERNIA  REPAIR    2019          x2          ?  HX HIP REPLACEMENT  Right  01/29/2015     ?  HX HIP REPLACEMENT         ?  HX HYSTERECTOMY    2010          hysterectomy          Social History          Socioeconomic History         ?  Marital status:  MARRIED              Spouse name:  Not on file         ?  Number of children:  Not on file     ?  Years of education:  Not on file     ?  Highest education level:  Not on file       Occupational History        ?  Not on file       Tobacco Use         ?  Smoking status:  Some Days     ?  Smokeless tobacco:  Never        ?  Tobacco comments:              reported as quit smoking around 1990s per spouse       Substance and Sexual Activity         ?  Alcohol use:  No     ?  Drug use:  Yes              Types:  Marijuana             Comment: reports smoking last week-marijuana         ?  Sexual activity:  Never        Other Topics  Concern        ?  Not on file       Social History Narrative        ?  Not on file          Social Determinants of Health          Financial Resource Strain: Not on file     Food Insecurity: Not on file     Transportation Needs: Not on file     Physical Activity: Not on file     Stress: Not on file     Social Connections: Not on file     Intimate Partner Violence: Not on file       Housing Stability: Not on file          Family History         Problem  Relation  Age of Onset          ?  Hypertension  Mother       ?  Other  Mother                OSA          ?  Cancer  Father                suspected leukemia per pt's spouse          ?  Cancer  Maternal Aunt            ?  Alcohol abuse  Maternal Grandfather               Allergies        Allergen  Reactions         ?  Chocolate [Cocoa]  Sneezing             Confirms allergy but reports "I still eat it"             Home Medications:          Prior to Admission medications             Medication  Sig  Start Date  End Date  Taking?  Authorizing Provider            insulin aspart U-100 (NovoLOG U-100 Insulin aspart) 100 unit/mL injection  30 Units by SubCUTAneous route Before breakfast, lunch, and dinner.      Yes  Other, Phys, MD     insulin glargine (Lantus Solostar U-100 Insulin) 100 unit/mL (3 mL) inpn  40 Units by SubCUTAneous route nightly.      Yes  Other, Phys, MD     allopurinoL (ZYLOPRIM) 100 mg tablet  Take 1 Tablet by mouth two (2) times a day.  11/23/20    Yes  Burtis Junes R, PA-C     polyethylene glycol (MIRALAX) 17 gram packet  Take 1 Packet by mouth daily as needed for Constipation. Indications: constipation  06/02/20    Yes  Scarlette Ar, MD     fluticasone  propionate (FLONASE) 50 mcg/actuation nasal spray  2 Sprays by Both Nostrils route daily as needed for Rhinitis or Allergies. Indications: inflammation of the nose due to an allergy  06/02/20    Yes  Scarlette Ar, MD            furosemide (LASIX) 20 mg tablet  Take 1 Tablet by mouth daily. Indications: fluid in the lungs due to chronic heart failure   Patient taking differently: Take 20 mg by mouth two (2) times a day. Indications: fluid in the lungs due to chronic heart failure  06/02/20    Yes  Scarlette Ar, MD            butalbital-acetaminophen-caffeine (FIORICET, ESGIC) 50-325-40 mg per tablet  butalbital-acetaminophen-caffeine 50 mg-325 mg-40 mg tablet    TAKE 1 TABLET BY MOUTH EVERY 4 HOURS AS NEEDED FOR HEADACHE      Yes  Provider, Historical     potassium chloride SR (K-TAB) 20 mEq tablet  potassium chloride ER 20 mEq tablet,extended release    TAKE 1 TABLET BY MOUTH ONCE DAILY      Yes  Provider, Historical     acetaminophen (TYLENOL) 325 mg tablet  Take 2 Tabs by mouth every four (4) hours as needed for Pain.  07/20/19    Yes  Digeronimo, Gabriel Rung, PA-C     albuterol (PROVENTIL HFA, VENTOLIN HFA, PROAIR HFA) 90 mcg/actuation inhaler  Take 2 Puffs by inhalation every four (4) hours as needed for Wheezing.  07/20/19    Yes  Digeronimo, Gabriel Rung, PA-C     dilTIAZem CD (CARDIZEM CD) 240 mg ER capsule  Take 1 Cap by mouth daily.  09/07/18    Yes  [name], MD     losartan (COZAAR) 50 mg tablet  Take 1 Tab by mouth daily.   Patient taking differently: Take 100 mg by mouth in the morning.  09/07/18    Yes  [name], MD     cetirizine (  ZYRTEC) 10 mg tablet  Take 1 Tab by mouth daily.  09/08/18    Yes  [name], MD     albuterol (ACCUNEB) 1.25 mg/3 mL nebu  USE 1 VIAL IN NEBULIZER EVERY 6 HOURS AS NEEDED FOR  SHORTNESS  OF  BREATH,  WHEEZING  05/30/18    Yes  Mal Amabile, Monique, PA-C            cloNIDine HCl (CATAPRES) 0.1 mg tablet  Take 3 Tabs by mouth two (2) times a day.   Patient taking differently:  Take 0.2 mg by mouth two (2) times a day.  05/20/18    Yes  [name], PA-C            metFORMIN ER (GLUCOPHAGE XR) 500 mg tablet  TAKE 1 TABLET TWICE DAILY  03/30/18    Yes  Mal Amabile, Monique, PA-C     montelukast (SINGULAIR) 10 mg tablet  TAKE 1 TABLET EVERY DAY  03/26/18    Yes  [name], PA-C     magnesium oxide (MAG-OX) 400 mg tablet  Take 400 mg by mouth daily.      Yes  Provider, Historical     QUEtiapine SR (SEROQUEL XR) 150 mg sr tablet  Take 150 mg by mouth nightly as needed.      Yes  Provider, Historical     tiotropium bromide (SPIRIVA RESPIMAT) 2.5 mcg/actuation inhaler  Take 1 Puff by inhalation daily.  10/11/17    Yes  Holladay, Versie Starks, MD     fluticasone propion-salmeterol (ADVAIR DISKUS) 100-50 mcg/dose diskus inhaler  Take 1 Puff by inhalation two (2) times a day.  10/11/17    Yes  Holladay, Versie Starks, MD            rosuvastatin (CRESTOR) 5 mg tablet  TAKE 1 TABLET BY MOUTH NIGHTLY  07/13/17    Yes  Littie Deeds, MD             Current Facility-Administered Medications          Medication  Dose  Route  Frequency           ?  amLODIPine (NORVASC) tablet 5 mg   5 mg  Oral  DAILY     ?  losartan-hydroCHLOROthiazide (HYZAAR) 50-12.5 mg per tablet 2 Tablet   2 Tablet  Oral  DAILY     ?  methylPREDNISolone (PF) (SOLU-MEDROL) injection 40 mg   40 mg  IntraVENous  Q12H     ?  cloNIDine HCL (CATAPRES) tablet 0.2 mg   0.2 mg  Oral  TID     ?  nitroGLYcerin (TRIDIL) 400 mcg/ml infusion   10 mcg/min  IntraVENous  CONTINUOUS     ?  allopurinoL (ZYLOPRIM) tablet 100 mg   100 mg  Oral  BID     ?  montelukast (SINGULAIR) tablet 10 mg   10 mg  Oral  DAILY           ?  rosuvastatin (CRESTOR) tablet 5 mg   5 mg  Oral  QHS           ?  tiotropium bromide (SPIRIVA RESPIMAT) 2.5 mcg /actuation   1 Puff  Inhalation  DAILY     ?  apixaban (ELIQUIS) tablet 10 mg   10 mg  Oral  BID     ?  [START ON 01/31/2021] apixaban (ELIQUIS) tablet 5 mg   5 mg  Oral  BID     ?  insulin glargine (LANTUS) injection 1-100 Units    1-100 Units  SubCUTAneous  QHS           ?  insulin lispro (HUMALOG) injection 1-100 Units   1-100 Units  SubCUTAneous  AC&HS                Review of Systems:        Positives in bold      Constitutional: Fever, chills, weight loss, weight gain, weakness, fatigue    Eyes: Blurred vision, double vision   ENT: Sore throat, congestion, headache    Respiratory: Cough, shortness of breath,  wheezing   Cardiovascular: Chest pain/pressure, orthopnea , PND, edema, palpitations, syncope   Gastrointestinal: Abdominal edema,  nausea, vomiting, diarrhea, constipation, melena, hematochezia, hematemesis   Genitourinary: Painful urination, frequency, urgency   Musculoskeletal: Joint pain, swelling,  falls   Integumentary: Rashes, wounds   Endocrine: Heat or cold intolerance, polydipsia, polyuria   Neurological: Headache, dizziness, extremity weakness, paresthesias, presyncope, syncope    Psychiatric: Depression, anxiety, substance abuse            Physical Examination:        Visit Vitals      BP  (!) 177/116     Pulse  96     Temp  97.6 ??F (36.4 ??C)     Resp  13     Ht   (1.702 m)     Wt  103.1 kg (227 lb 4.8 oz)     SpO2  97%        BMI  35.60 kg/m??              General: NAD   HEENT: oral mucosa well perfused   Neck: No JVD trachea midline   Resp: B/L Clear, decreased bases    Cardiovascular: regular rhythm normal s1and s2; No murmurs or rubs. Palpable DP pulses   Abd: Positive Bowel Sounds, Soft, Nontender nondistended   Ext: No clubbing, cyanosis, no edema warm and well perfused   Neuro: Alert and oriented x 3, Nonfocal; moves all extremities   Skin: Warm, Dry, Intact      ECG: Pending      Labs:      GFR: Estimated Creatinine Clearance: 89.8 mL/min (based on SCr of 0.74 mg/dL).         Lab Results         Component  Value  Date/Time            WBC  12.3 (H)  01/25/2021 04:07 AM       RBC  4.66  01/25/2021 04:07 AM       HGB  13.0  01/25/2021 04:07 AM       HCT  41.8  01/25/2021 04:07 AM       MCV  89.7  01/25/2021 04:07  AM       MCH  27.9  01/25/2021 04:07 AM       MCHC  31.1  01/25/2021 04:07 AM       RDW  14.6 (H)  09/21/2017 12:00 PM            PLT  232  01/25/2021 04:07 AM             Lab Results         Component  Value  Date/Time            GRANS  93.5 (H)  01/25/2021 04:07 AM  LYMPH  5.1 (L)  01/25/2021 04:07 AM       MONOS  0.7 (L)  01/25/2021 04:07 AM       EOS  0.0  01/25/2021 04:07 AM            BASOS  0.1  01/25/2021 04:07 AM             Lab Results         Component  Value  Date            NA  140  01/25/2021       K  3.8  01/25/2021       CL  96 (L)  01/25/2021       CO2  34 (H)  01/25/2021       BUN  15  01/25/2021       CREA  0.74  01/25/2021       GLU  213 (H)  01/25/2021       CA  9.4  01/25/2021       MG  2.1  06/05/2019            PHOS  3.7  09/06/2018              Lab Results         Component  Value  Date/Time            CK  165  09/08/2017 09:30 AM       CK - MB  0.7  09/08/2017 09:30 AM       CK-MB Index  0.4  09/08/2017 09:30 AM            Troponin-I  <0.015  08/22/2019 01:04 PM              Lab Results         Component  Value  Date/Time            TSH  0.170 (L)  06/02/2019 12:43 PM       T4, Free  1.0  12/18/2014 02:30 PM            Free T4  1.26  11/15/2018 03:10 PM              Lab Results         Component  Value  Date            PTP  11.0  05/26/2020       INR  0.9  05/26/2020            APTT  30.9  01/16/2015             Lab Results         Component  Value  Date/Time            Hemoglobin A1c  5.5  01/07/2021 09:10 AM       Hemoglobin A1c (POC)  8.7  01/19/2018 01:40 PM            Hemoglobin A1c, External  6.6  01/08/2014 12:00 AM               Lab Results         Component  Value  Date/Time            Cholesterol, total  119 (L)  06/30/2020 02:08 PM       HDL Cholesterol  39 (L)  06/30/2020 02:08 PM       LDL, calculated  39  06/30/2020 02:08 PM       VLDL, calculated  31.8  06/10/2016 01:00 PM       Triglyceride  203 (H)  06/30/2020 02:08 PM            CHOL/HDL Ratio  3.1  06/30/2020 02:08  PM              Lab Results         Component  Value  Date            ALB  3.8  01/24/2021       TP  7.6  01/24/2021       CBIL  0.1  05/28/2020       TBILI  0.50  01/24/2021       ALT  <7 (L)  01/24/2021       AP  81  01/24/2021       AML  41  05/28/2020            LPSE  130  05/28/2020             Lab Results         Component  Value  Date            SPGRU  >=1.030  08/22/2019       PHU  6.0  08/22/2019       PROTU  >=300 (A)  08/22/2019       GLUCU  250 (A)  08/22/2019       KETU  Negative  08/22/2019       BLDU  Moderate (A)  08/22/2019       NITU  Negative  08/22/2019       UROU  0.2  08/22/2019       LEUKU  Trace (A)  08/22/2019       WBCU  1-4  08/22/2019       RBCU  1-4  08/22/2019       EPSU  FEW  09/20/2017            BACTU  3+  08/22/2019                 Duane Lope, PA   January 25, 2021, 9:23 AM   [phone]

## 2021-01-25 NOTE — Progress Notes (Signed)
PAGER ID: [phone]  MESSAGE: [name] [name]-Pt here with COPD exacerbation, has been hypertensive, 183/123 this AM. Pt on Nitro gtts and only has 100mg  Losartan scheduled this AM. No other scheduled or PRNs. Any new orders? [phone]
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
## 2021-01-25 NOTE — Progress Notes (Signed)
Formatting of this note might be different from the original.  PAGER ID: [phone]  MESSAGE: [email or web address]-Pt here with COPD exacerbation, has been hypertensive, 183/123 this AM. Pt on Nitro gtts and only has 100mg  Losartan scheduled this AM. No other scheduled or PRNs. Any new orders? [phone]  Electronically signed by [id number], RN at 01/25/2021  8:11 AM EDT

## 2021-01-25 NOTE — Progress Notes (Signed)
Formatting of this note might be different from the original.  Bedside shift change report given to [employer], RN (Cabin crew) by Lorelle Formosa, RN (offgoing nurse). Report included the following information SBAR, Kardex, Intake/Output, MAR, Recent Results, and Cardiac Rhythm SR .     Electronically signed by Alyssa Grove, RN at 01/25/2021  7:41 PM EDT
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
## 2021-01-25 NOTE — Progress Notes (Signed)
Formatting of this note might be different from the original.    Problem: Gas Exchange - Impaired  Goal: *Absence of hypoxia  Outcome: Progressing Towards Goal    Problem: Falls - Risk of  Goal: *Absence of Falls  Description: Document [name] Fall Risk and appropriate interventions in the flowsheet.  Outcome: Progressing Towards Goal  Note: Fall Risk Interventions:  Mobility Interventions: Bed/chair exit alarm, Patient to call before getting OOB        Medication Interventions: Bed/chair exit alarm    Elimination Interventions: Bed/chair exit alarm, Call light in reach, Toileting schedule/hourly rounds          Electronically signed by Alyssa Grove, RN at 01/25/2021 12:56 PM EDT

## 2021-01-25 NOTE — Progress Notes (Signed)
Formatting of this note is different from the original.  Progress Note    Patient: [name] [name]   Age:  70 y.o.  DOA: 01/24/2021   Admit Dx / CC: Acute CHF (congestive heart failure) ([place]) [I50.9]  LOS:  LOS: 1 day     Active Problems   1.  Acute on chronic hypoxic and hypercapnic respiratory failure  2.  COPD with acute exacerbation  3.  Acute DVT L arm  4.  Hypertensive urgency  5.  DM type II controlled  6.  Hypokalemia  7.  CHF chronic diastolic  8.  Obesity with possible OSA  Plan:   1.  Patient was seen and examined patient was still complaining of some shortness of breath.  Patient be continued on BiPAP.  Patient was told  BiPAP can be taken off for few minutes during meals.  We will continue frequent breathing treatments and IV steroids.  Pulmonary consult was done as well  2.  Patient diabetes is well controlled but due to IV steroids blood glucose is high patient was started on Glucomander protocol.  Patient's hemoglobin A1c was 8.7 a year ago which is now 5.5  3.  For better hypertension control patient was started on Norvasc 5 mg daily along with Hyzaar 100/25 mg daily.  Nitroglycerin drip will be weaned off.  Patient be started on Imdur 30 mg daily  4.  Patient had left arm Doppler done which showed acute DVT.  Patient was started on Eliquis 10 mg twice daily for a week followed by 5 mg twice daily  5.  Patient's previous medical record was reviewed in detail.  Patient's condition and management plan discussed with patient's husband in detail  6.  Further management depending on patient progress and response to the treatment  Subjective:   Complaining of some shortness of breath also has some cough denies any pain no nausea vomiting  Also feeling tired  Objective:   Visit Vitals  BP (!) 156/103   Pulse (!) 103   Temp 98.6 F (37 C)   Resp 18   Ht '5\' 7"'  (1.702 m)   Wt 103.1 kg (227 lb 4.8 oz)   SpO2 96%   BMI 35.60 kg/m     Physical Exam:  General appearance: Elderly female well-built in moderate  distress on BiPAP  Head: Normocephalic, without obvious abnormality, atraumatic  Neck: supple, trachea midline  Lungs: Scattered expiratory rhonchi's  Heart: Regular rhythm, no murmur, tachycardic  Abdomen: soft, non-tender. Bowel sounds Audible,  Extremities: Trace edema, distal pulses palpable  Skin: Warm and dry  Neurologic: Awake and alert    Intake and Output:  Current Shift:  08/15 0701 - 08/15 1900  In: 31.9 [I.V.:31.9]  Out: -   Last three shifts:  08/13 1901 - 08/15 0700  In: -   Out: 350 [Urine:350]    Lab/Data Reviewed:  Recent Results (from the past 48 hour(s))   CBC WITH AUTOMATED DIFF    Collection Time: 01/24/21  8:20 AM   Result Value Ref Range    WBC 12.3 (H) 4.0 - 11.0 1000/mm3    RBC 4.52 3.60 - 5.20 M/uL    HGB 12.8 (L) 13.0 - 17.2 gm/dl    HCT 40.9 37.0 - 50.0 %    MCV 90.5 80.0 - 98.0 fL    MCH 28.3 25.4 - 34.6 pg    MCHC 31.3 30.0 - 36.0 gm/dl    PLATELET 196 140 - 450 1000/mm3  MPV 11.0 (H) 6.0 - 10.0 fL    RDW-SD 47.8 (H) 36.4 - 46.3      NRBC 0 0 - 0      IMMATURE GRANULOCYTES 0.7 0.0 - 3.0 %    NEUTROPHILS 80.8 (H) 34 - 64 %    LYMPHOCYTES 13.1 (L) 28 - 48 %    MONOCYTES 4.3 1 - 13 %    EOSINOPHILS 0.8 0 - 5 %    BASOPHILS 0.3 0 - 3 %   METABOLIC PANEL, COMPREHENSIVE    Collection Time: 01/24/21  8:20 AM   Result Value Ref Range    Potassium 3.3 (L) 3.5 - 5.1 mEq/L    Chloride 98 98 - 107 mEq/L    Sodium 140 136 - 145 mEq/L    CO2 36 (H) 20 - 31 mEq/L    Glucose 198 (H) 74 - 106 mg/dl    BUN 10 9 - 23 mg/dl    Creatinine 0.75 0.55 - 1.02 mg/dl    GFR est AA >60.0      GFR est non-AA >60      Calcium 9.3 8.7 - 10.4 mg/dl    Anion gap 6 5 - 15 mmol/L    AST (SGOT) 12.0 0.0 - 33.9 U/L    ALT (SGPT) <7 (L) 10 - 49 U/L    Alk. phosphatase 81 46 - 116 U/L    Bilirubin, total 0.50 0.30 - 1.20 mg/dl    Protein, total 7.6 5.7 - 8.2 gm/dl    Albumin 3.8 3.4 - 5.0 gm/dl   TROPONIN-HIGH SENSITIVITY    Collection Time: 01/24/21  8:20 AM   Result Value Ref Range    Troponin-High Sensitivity <3 0 - 59  ng/L   NT-PRO BNP    Collection Time: 01/24/21  8:20 AM   Result Value Ref Range    NT pro-BNP 27 0 - 125     POC BLOOD GAS + LACTIC ACID    Collection Time: 01/24/21  9:07 AM   Result Value Ref Range    pH 7.375 7.350 - 7.450      PCO2 69.3 (HH) 35.0 - 45.0 mm Hg    PO2 61.0 (L) 75 - 100 mm Hg    BICARBONATE 40.5 (HH) 18.0 - 26.0 mmol/L    O2 SAT 89.0 (L) 90 - 100 %    CO2, TOTAL 43.0 (H) 24 - 29 mmol/L    Lactic Acid 1.14 0.40 - 2.00 mmol/L    BASE EXCESS 15 (H) -2 - 3 mmol/L    Patient temp. 98.7 F      Sample type Art      SITE R Radial      DEVICE Nasal Can      ALLENS TEST Pass      Liters/min. 4      NOTIFIED Doctor     TROPONIN-HIGH SENSITIVITY    Collection Time: 01/24/21 10:25 AM   Result Value Ref Range    Troponin-High Sensitivity <3 0 - 59 ng/L   TROPONIN-HIGH SENSITIVITY    Collection Time: 01/24/21 12:15 PM   Result Value Ref Range    Troponin-High Sensitivity 4 0 - 59 ng/L   POC BLOOD GAS + LACTIC ACID    Collection Time: 01/24/21  2:47 PM   Result Value Ref Range    pH 7.405 7.350 - 7.450      PCO2 66.3 (HH) 35.0 - 45.0 mm Hg    PO2 86.0 75 - 100 mm  Hg    BICARBONATE 41.5 (HH) 18.0 - 26.0 mmol/L    O2 SAT 96.0 90 - 100 %    CO2, TOTAL 44.0 (H) 24 - 29 mmol/L    Lactic Acid 0.63 0.40 - 2.00 mmol/L    BASE EXCESS 17 (H) -2 - 3 mmol/L    Sample type Art      FIO2 35      SITE R Brachial      DEVICE BiPAP      ALLENS TEST Pass      IPAP/PIP 22      EPAP/CPAP/PEEP 10      Respiratory Rate 19      SET RATE 12      VTEXP 479     GLUCOSE, POC    Collection Time: 01/24/21  8:53 PM   Result Value Ref Range    Glucose (POC) 220 (H) 65 - 105 mg/dL   CBC WITH AUTOMATED DIFF    Collection Time: 01/25/21  4:07 AM   Result Value Ref Range    WBC 12.3 (H) 4.0 - 11.0 1000/mm3    RBC 4.66 3.60 - 5.20 M/uL    HGB 13.0 13.0 - 17.2 gm/dl    HCT 41.8 37.0 - 50.0 %    MCV 89.7 80.0 - 98.0 fL    MCH 27.9 25.4 - 34.6 pg    MCHC 31.1 30.0 - 36.0 gm/dl    PLATELET 232 140 - 450 1000/mm3    MPV 11.4 (H) 6.0 - 10.0 fL    RDW-SD  46.6 (H) 36.4 - 46.3      NRBC 0 0 - 0      IMMATURE GRANULOCYTES 0.6 0.0 - 3.0 %    NEUTROPHILS 93.5 (H) 34 - 64 %    LYMPHOCYTES 5.1 (L) 28 - 48 %    MONOCYTES 0.7 (L) 1 - 13 %    EOSINOPHILS 0.0 0 - 5 %    BASOPHILS 0.1 0 - 3 %   METABOLIC PANEL, BASIC    Collection Time: 01/25/21  4:07 AM   Result Value Ref Range    Potassium 3.8 3.5 - 5.1 mEq/L    Chloride 96 (L) 98 - 107 mEq/L    Sodium 140 136 - 145 mEq/L    CO2 34 (H) 20 - 31 mEq/L    Glucose 213 (H) 74 - 106 mg/dl    BUN 15 9 - 23 mg/dl    Creatinine 0.74 0.55 - 1.02 mg/dl    GFR est AA >60.0      GFR est non-AA >60      Calcium 9.4 8.7 - 10.4 mg/dl    Anion gap 10 5 - 15 mmol/L   GLUCOSE, POC    Collection Time: 01/25/21  7:35 AM   Result Value Ref Range    Glucose (POC) 258 (H) 65 - 105 mg/dL   GLUCOSE, POC    Collection Time: 01/25/21 12:03 PM   Result Value Ref Range    Glucose (POC) 309 (H) 65 - 105 mg/dL   POC BLOOD GAS + LACTIC ACID    Collection Time: 01/25/21  3:24 PM   Result Value Ref Range    pH 7.420 7.350 - 7.450      PCO2 58.9 (H) 35.0 - 45.0 mm Hg    PO2 82.0 75 - 100 mm Hg    BICARBONATE 38.2 (HH) 18.0 - 26.0 mmol/L    O2 SAT 96.0 90 - 100 %  CO2, TOTAL 40.0 (H) 24 - 29 mmol/L    Lactic Acid 2.10 (H) 0.40 - 2.00 mmol/L    BASE EXCESS 14 (H) -2 - 3 mmol/L    Patient temp. 98.7 F      Sample type Art      FIO2 35      SITE R Radial      DEVICE BiPAP      ALLENS TEST Pass      IPAP/PIP 22      EPAP/CPAP/PEEP 10      Respiratory Rate 17      SET RATE 12      VTEXP 452     GLUCOSE, POC    Collection Time: 01/25/21  4:28 PM   Result Value Ref Range    Glucose (POC) 244 (H) 65 - 105 mg/dL     Imaging:  No results found.    Medications Reviewed:  Current Facility-Administered Medications   Medication Dose Route Frequency    amLODIPine (NORVASC) tablet 5 mg  5 mg Oral DAILY    losartan-hydroCHLOROthiazide (HYZAAR) 50-12.5 mg per tablet 2 Tablet  2 Tablet Oral DAILY    methylPREDNISolone (PF) (SOLU-MEDROL) injection 40 mg  40 mg IntraVENous Q12H     cloNIDine HCL (CATAPRES) tablet 0.2 mg  0.2 mg Oral TID    [START ON 01/26/2021] tiotropium bromide (SPIRIVA RESPIMAT) 2.5 mcg /actuation  2 Puff Inhalation DAILY    nitroGLYcerin (TRIDIL) 400 mcg/ml infusion  10 mcg/min IntraVENous CONTINUOUS    allopurinoL (ZYLOPRIM) tablet 100 mg  100 mg Oral BID    montelukast (SINGULAIR) tablet 10 mg  10 mg Oral DAILY    rosuvastatin (CRESTOR) tablet 5 mg  5 mg Oral QHS    naloxone (NARCAN) injection 0.1 mg  0.1 mg IntraVENous PRN    acetaminophen (TYLENOL) tablet 650 mg  650 mg Oral Q4H PRN    Or    acetaminophen (TYLENOL) solution 650 mg  650 mg Oral Q4H PRN    Or    acetaminophen (TYLENOL) suppository 650 mg  650 mg Rectal Q4H PRN    ondansetron (ZOFRAN) injection 4 mg  4 mg IntraVENous Q4H PRN    apixaban (ELIQUIS) tablet 10 mg  10 mg Oral BID    [START ON 01/31/2021] apixaban (ELIQUIS) tablet 5 mg  5 mg Oral BID    dextrose (D50W) injection syrg 5-25 g  10-50 mL IntraVENous PRN    glucagon (GLUCAGEN) injection 1 mg  1 mg IntraMUSCular PRN    insulin glargine (LANTUS) injection 1-100 Units  1-100 Units SubCUTAneous QHS    insulin lispro (HUMALOG) injection 1-100 Units  1-100 Units SubCUTAneous AC&HS    insulin lispro (HUMALOG) injection 1-100 Units  1-100 Units SubCUTAneous PRN     Dragon medical dictation software was used for portions of this report. Unintended errors may occur.   Elon Spanner, MD  P# [phone]  January 25, 2021   Electronically signed by Elon Spanner, MD at 01/25/2021  4:57 PM EDT

## 2021-01-25 NOTE — Consults (Signed)
Consults  by Verlan Friends, MD at 01/25/21 [id number]                Author: Verlan Friends, MD  Service: Pulmonary Disease  Author Type: Physician       Filed: 01/25/21 1544  Date of Service: 01/25/21 0955  Status: Signed          Editor: Verlan Friends, MD (Physician)                       [facility] PULMONARY AND CRITICAL CARE MEDICINE          Pulmonary Consult Note      Patient: [name] [name]               Sex: female          DOA: 01/24/2021         Date of Birth:  [birth year]      Age:  70 y.o.        LOS:  LOS: 1 day          Reason for Consult:  Asthma/COPD exacerbation acute left subclavian DVT        IMPRESSION:     ??    Worsening shortness of breath, shortness of breath, and wheezing due to COPD with exacerbation   ??  ABG demonstrated primary metabolic alkalosis, and secondary respiratory acidosis   ??  Asthma/COPD with exacerbation   ??  Venous duplex of upper extremity obtained on 01/24/2021 demonstrated acute deep venous thrombosis of left upper extremity with occlusive thrombus in subclavian vein   ??  GERD   ??  PTSD       RECOMMENDATIONS:     ??    Continue BiPAP on regular basis during nighttime, and as needed basis during daytime.  Titrate supplemental oxygen through nasal cannula,  and target oxygen saturation greater than 88%   ??  Continue Solu-Medrol 40 mg twice a day.  If remains stable switch to p.o. prednisone   ??  Continue Spiriva 2.5 mcg, but change to 2 inhalations a day for optimal benefit.  Add Breo 100/25 mcg 1 elation a day (patient is on Advair 100/50 twice a day).  Continue DuoNeb through nebulizer every 6 hours.   ??  Avoid excessive diuresis in light of ABG demonstrating metabolic alkalosis, this may deteriorate PCO2 retention   ??  DVT prophylaxis: On Eliquis for acute DVT   ??  Importance of smoking cessation discuss with patient at length.   ??  Will defer respective systems problem management to primary and other consultant and follow patient with primary and other team   ??   Further recommendations will be based on the patient's response to recommended treatment and results of the investigation ordered.          HPI:     Ms. [name] [name] is a 70 y.o. female who carries known history of chronic obstructive lung disease, chronic pain, hypertension, diabetes, and stable pulmonary nodule presented to  the [facility] ER on 01/24/2021 with interval increase in shortness of breath from baseline for past 1 week.  She also reported left arm pain and swelling at the time of presentation.  Subsequent work-up including venous duplex of left upper extremity demonstrated  occlusive subclavian vein DVT.   Interestingly ABG demonstrated metabolic alkalosis with secondary respiratory acidosis.  patient is on Lasix and hydrochlorothiazide might of been one of the driving factor behind respiratory alkalosis.  Review of Systems   Review of Systems    Unable to perform ROS: Other           Past Medical History     Past Medical History:        Diagnosis  Date         ?  Arthritis       ?  Chronic pain syndrome            related to R hip replacement         ?  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  DM2 (diabetes mellitus, type 2) (HCC)       ?  GERD (gastroesophageal reflux disease)       ?  Hepatitis C            HEP C         ?  HTN (hypertension)       ?  Lung nodule, multiple            (CT 10/2016) Small left upper lobe and 1.7 x 1.6 cm left lower lobe nodules not significantly changed from 07/23/2016 and 03/22/2016         ?  Marijuana use       ?  PTSD (post-traumatic stress disorder)            lived through the [place]         ?  Thoracic ascending aortic aneurysm (HCC)            4.4cm noted on CT Chest (10/2016)         ?  Thyroid nodule            2.5 cm stable right thyroid nodule (CT 10/2016)           Past Surgical History     Past Surgical History:         Procedure  Laterality  Date          ?  HX  ENDOSCOPY         ?  HX HERNIA REPAIR    2019          x2          ?  HX HIP REPLACEMENT  Right  01/29/2015     ?  HX HIP REPLACEMENT         ?  HX HYSTERECTOMY    2010          hysterectomy           Family History     Family History         Problem  Relation  Age of Onset          ?  Hypertension  Mother       ?  Other  Mother                OSA          ?  Cancer  Father                suspected leukemia per pt's spouse          ?  Cancer  Maternal Aunt            ?  Alcohol abuse  Maternal Grandfather  Social History     Social History          Tobacco Use         ?  Smoking status:  Some Days     ?  Smokeless tobacco:  Never        ?  Tobacco comments:             reported as quit smoking around 1990s per spouse       Substance Use Topics         ?  Alcohol use:  No     ?  Drug use:  Yes              Types:  Marijuana             Comment: reports smoking last week-marijuana            Medications     Current Facility-Administered Medications          Medication  Dose  Route  Frequency           ?  amLODIPine (NORVASC) tablet 5 mg   5 mg  Oral  DAILY     ?  losartan-hydroCHLOROthiazide (HYZAAR) 50-12.5 mg per tablet 2 Tablet   2 Tablet  Oral  DAILY     ?  methylPREDNISolone (PF) (SOLU-MEDROL) injection 40 mg   40 mg  IntraVENous  Q12H     ?  cloNIDine HCL (CATAPRES) tablet 0.2 mg   0.2 mg  Oral  TID     ?  nitroGLYcerin (TRIDIL) 400 mcg/ml infusion   10 mcg/min  IntraVENous  CONTINUOUS     ?  allopurinoL (ZYLOPRIM) tablet 100 mg   100 mg  Oral  BID     ?  montelukast (SINGULAIR) tablet 10 mg   10 mg  Oral  DAILY     ?  rosuvastatin (CRESTOR) tablet 5 mg   5 mg  Oral  QHS     ?  tiotropium bromide (SPIRIVA RESPIMAT) 2.5 mcg /actuation   1 Puff  Inhalation  DAILY     ?  naloxone (NARCAN) injection 0.1 mg   0.1 mg  IntraVENous  PRN     ?  acetaminophen (TYLENOL) tablet 650 mg   650 mg  Oral  Q4H PRN          Or           ?  acetaminophen (TYLENOL) solution 650 mg   650 mg  Oral  Q4H PRN          Or            ?  acetaminophen (TYLENOL) suppository 650 mg   650 mg  Rectal  Q4H PRN     ?  ondansetron (ZOFRAN) injection 4 mg   4 mg  IntraVENous  Q4H PRN     ?  apixaban (ELIQUIS) tablet 10 mg   10 mg  Oral  BID     ?  [START ON 01/31/2021] apixaban (ELIQUIS) tablet 5 mg   5 mg  Oral  BID     ?  dextrose (D50W) injection syrg 5-25 g   10-50 mL  IntraVENous  PRN     ?  glucagon (GLUCAGEN) injection 1 mg   1 mg  IntraMUSCular  PRN     ?  insulin glargine (LANTUS) injection 1-100 Units   1-100 Units  SubCUTAneous  QHS     ?  insulin lispro (  HUMALOG) injection 1-100 Units   1-100 Units  SubCUTAneous  AC&HS           ?  insulin lispro (HUMALOG) injection 1-100 Units   1-100 Units  SubCUTAneous  PRN          Prior to Admission medications             Medication  Sig  Start Date  End Date  Taking?  Authorizing Provider            insulin aspart U-100 (NovoLOG U-100 Insulin aspart) 100 unit/mL injection  30 Units by SubCUTAneous route Before breakfast, lunch, and dinner.      Yes  Other, Phys, MD     insulin glargine (Lantus Solostar U-100 Insulin) 100 unit/mL (3 mL) inpn  40 Units by SubCUTAneous route nightly.      Yes  Other, Phys, MD     allopurinoL (ZYLOPRIM) 100 mg tablet  Take 1 Tablet by mouth two (2) times a day.  11/23/20    Yes  Burtis JunesMiller, Jason R, PA-C     polyethylene glycol (MIRALAX) 17 gram packet  Take 1 Packet by mouth daily as needed for Constipation. Indications: constipation  06/02/20    Yes  Scarlette Aramarato, Joseph, MD     fluticasone propionate (FLONASE) 50 mcg/actuation nasal spray  2 Sprays by Both Nostrils route daily as needed for Rhinitis or Allergies. Indications: inflammation of the nose due to an allergy  06/02/20    Yes  Scarlette Aramarato, Joseph, MD            furosemide (LASIX) 20 mg tablet  Take 1 Tablet by mouth daily. Indications: fluid in the lungs due to chronic heart failure   Patient taking differently: Take 20 mg by mouth two (2) times a day. Indications: fluid in the lungs due to chronic heart failure   06/02/20    Yes  Scarlette Aramarato, Joseph, MD            butalbital-acetaminophen-caffeine (FIORICET, ESGIC) 50-325-40 mg per tablet  butalbital-acetaminophen-caffeine 50 mg-325 mg-40 mg tablet    TAKE 1 TABLET BY MOUTH EVERY 4 HOURS AS NEEDED FOR HEADACHE      Yes  Provider, Historical     potassium chloride SR (K-TAB) 20 mEq tablet  potassium chloride ER 20 mEq tablet,extended release    TAKE 1 TABLET BY MOUTH ONCE DAILY      Yes  Provider, Historical     acetaminophen (TYLENOL) 325 mg tablet  Take 2 Tabs by mouth every four (4) hours as needed for Pain.  07/20/19    Yes  Digeronimo, Gabriel RungKate E, PA-C     albuterol (PROVENTIL HFA, VENTOLIN HFA, PROAIR HFA) 90 mcg/actuation inhaler  Take 2 Puffs by inhalation every four (4) hours as needed for Wheezing.  07/20/19    Yes  Digeronimo, Gabriel RungKate E, PA-C     dilTIAZem CD (CARDIZEM CD) 240 mg ER capsule  Take 1 Cap by mouth daily.  09/07/18    Yes  Alvy BimlerMunoz, Marc E, MD     losartan (COZAAR) 50 mg tablet  Take 1 Tab by mouth daily.   Patient taking differently: Take 100 mg by mouth in the morning.  09/07/18    Yes  Alvy BimlerMunoz, Marc E, MD     cetirizine (ZYRTEC) 10 mg tablet  Take 1 Tab by mouth daily.  09/08/18    Yes  Alvy BimlerMunoz, Marc E, MD     albuterol (ACCUNEB) 1.25 mg/3 mL nebu  USE 1 VIAL IN  NEBULIZER EVERY 6 HOURS AS NEEDED FOR  SHORTNESS  OF  BREATH,  WHEEZING  05/30/18    Yes  Mal Amabile, Monique, PA-C     cloNIDine HCl (CATAPRES) 0.1 mg tablet  Take 3 Tabs by mouth two (2) times a day.   Patient taking differently: Take 0.2 mg by mouth two (2) times a day.  05/20/18    Yes  [name], PA-C     metFORMIN ER (GLUCOPHAGE XR) 500 mg tablet  TAKE 1 TABLET TWICE DAILY  03/30/18    Yes  Mal Amabile, Monique, PA-C     montelukast (SINGULAIR) 10 mg tablet  TAKE 1 TABLET EVERY DAY  03/26/18    Yes  [name], PA-C     magnesium oxide (MAG-OX) 400 mg tablet  Take 400 mg by mouth daily.      Yes  Provider, Historical     QUEtiapine SR (SEROQUEL XR) 150 mg sr tablet  Take 150 mg by mouth nightly as needed.       Yes  Provider, Historical            tiotropium bromide (SPIRIVA RESPIMAT) 2.5 mcg/actuation inhaler  Take 1 Puff by inhalation daily.  10/11/17    Yes  Holladay, Versie Starks, MD            fluticasone propion-salmeterol (ADVAIR DISKUS) 100-50 mcg/dose diskus inhaler  Take 1 Puff by inhalation two (2) times a day.  10/11/17    Yes  Holladay, Versie Starks, MD            rosuvastatin (CRESTOR) 5 mg tablet  TAKE 1 TABLET BY MOUTH NIGHTLY  07/13/17    Yes  Littie Deeds, MD           Allergy     Allergies        Allergen  Reactions         ?  Chocolate [Cocoa]  Sneezing             Confirms allergy but reports "I still eat it"             Physical Exam:     Vital Signs:       Blood pressure (!) 177/116, pulse 96, temperature  97.6 ??F (36.4 ??C), resp. rate 13, height 5\' 7"  (1.702 m), weight 103.1 kg (227 lb 4.8 oz), SpO2 97 %. Body mass index is 35.6 kg/m??.      O2 Device: BIPAP     O2 Flow Rate (L/min): 4 l/min       Temp (24hrs), Avg:97.1 ??F (36.2 ??C), Min:96.8 ??F (36 ??C), Max:97.6 ??F (36.4 ??C)           Intake/Output:    Last shift:      No intake/output data recorded.   Last 3 shifts: 08/13 1901 - 08/15 0700   In: -    Out: 350 [Urine:350]      Intake/Output Summary (Last 24 hours) at 01/25/2021 0956   Last data filed at 01/25/2021 0313     Gross per 24 hour        Intake  --        Output  350 ml        Net  -350 ml         Physical Exam   Vitals and nursing note reviewed.     HENT:       Mouth/Throat:       Mouth: Mucous membranes are moist.  Eyes:       Pupils: Pupils are equal, round, and reactive to light.     Cardiovascular:       Rate and Rhythm: Regular rhythm.    Abdominal:       General: Bowel sounds are normal.       Palpations: Abdomen is soft.     Musculoskeletal:          General: No swelling.    Skin:      General: Skin is warm and dry.    Neurological:       General: No focal deficit present.       Mental Status: She is alert and oriented to person, place, and time.                Labs Reviewed:     Recent  Results (from the past 24 hour(s))     TROPONIN-HIGH SENSITIVITY          Collection Time: 01/24/21 10:25 AM         Result  Value  Ref Range            Troponin-High Sensitivity  <3  0 - 59 ng/L       TROPONIN-HIGH SENSITIVITY          Collection Time: 01/24/21 12:15 PM         Result  Value  Ref Range            Troponin-High Sensitivity  4  0 - 59 ng/L       POC BLOOD GAS + LACTIC ACID          Collection Time: 01/24/21  2:47 PM         Result  Value  Ref Range            pH  7.405  7.350 - 7.450         PCO2  66.3 (HH)  35.0 - 45.0 mm Hg       PO2  86.0  75 - 100 mm Hg       BICARBONATE  41.5 (HH)  18.0 - 26.0 mmol/L       O2 SAT  96.0  90 - 100 %       CO2, TOTAL  44.0 (H)  24 - 29 mmol/L       Lactic Acid  0.63  0.40 - 2.00 mmol/L       BASE EXCESS  17 (H)  -2 - 3 mmol/L       Sample type  Art          FIO2  35          SITE  R Brachial          DEVICE  BiPAP          ALLENS TEST  Pass          IPAP/PIP  22          EPAP/CPAP/PEEP  10          Respiratory Rate  19          SET RATE  12          VTEXP  479          GLUCOSE, POC          Collection Time: 01/24/21  8:53 PM         Result  Value  Ref Range  Glucose (POC)  220 (H)  65 - 105 mg/dL       CBC WITH AUTOMATED DIFF          Collection Time: 01/25/21  4:07 AM         Result  Value  Ref Range            WBC  12.3 (H)  4.0 - 11.0 1000/mm3       RBC  4.66  3.60 - 5.20 M/uL       HGB  13.0  13.0 - 17.2 gm/dl            HCT  29.5  62.1 - 50.0 %            MCV  89.7  80.0 - 98.0 fL       MCH  27.9  25.4 - 34.6 pg       MCHC  31.1  30.0 - 36.0 gm/dl       PLATELET  308  657 - 450 1000/mm3       MPV  11.4 (H)  6.0 - 10.0 fL       RDW-SD  46.6 (H)  36.4 - 46.3         NRBC  0  0 - 0         IMMATURE GRANULOCYTES  0.6  0.0 - 3.0 %       NEUTROPHILS  93.5 (H)  34 - 64 %       LYMPHOCYTES  5.1 (L)  28 - 48 %       MONOCYTES  0.7 (L)  1 - 13 %       EOSINOPHILS  0.0  0 - 5 %       BASOPHILS  0.1  0 - 3 %       METABOLIC PANEL, BASIC          Collection Time:  01/25/21  4:07 AM         Result  Value  Ref Range            Potassium  3.8  3.5 - 5.1 mEq/L       Chloride  96 (L)  98 - 107 mEq/L       Sodium  140  136 - 145 mEq/L       CO2  34 (H)  20 - 31 mEq/L       Glucose  213 (H)  74 - 106 mg/dl       BUN  15  9 - 23 mg/dl       Creatinine  8.46  0.55 - 1.02 mg/dl       GFR est AA  >96.2          GFR est non-AA  >60          Calcium  9.4  8.7 - 10.4 mg/dl       Anion gap  10  5 - 15 mmol/L       GLUCOSE, POC          Collection Time: 01/25/21  7:35 AM         Result  Value  Ref Range            Glucose (POC)  258 (H)  65 - 105 mg/dL              No results for input(s): FIO2I, IFO2, HCO3I, IHCO3, HCOPOC, PCO2I, PCOPOC, IPHI, PHI, PHPOC, PO2I, PO2POC  in the last 72 hours.      No lab exists for component: IPOC2        All Micro Results              None                            Imaging:   [x] I  have personally reviewed the patients chest radiographs images and report with the  patient   Results from Hospital Encounter encounter on 01/24/21      XR CHEST SNGL V      Narrative   XR CHEST SNGL V      Clinical indication: sob.      Comparison: 01/24/2021      Support devices: None      Lungs: Stable exam demonstrating lucency overlying the left upper lung field,   compatible with chronic bullous changes. Unchanged rounded density overlying the   left midlung field. Remainder the lungs are unchanged.      Cardiomediastinal: Normal morphology for radiographic technique.      Bones/soft tissues: No acute abnormalities.      Impression   IMPRESSION:      Stable exam.         Results from Hospital Encounter encounter on 06/01/19      CT ABD PELV W CONT      Narrative   EXAM: CT ABD PELV W CONT      INDICATION: LLQ abd pain      TECHNIQUE: Axial CT scan of the abdomen and pelvis with   IV contrast   including coronal and sagittal reconstructions.   DICOM format image data is available to non-affiliated external healthcare   facilities or entities on a secure, media free,  reciprocally searchable basis   with patient authorization for 12 months following the date of the study.         COMPARISON: 05/07/2019      FINDINGS:   Lower thorax: Bibasilar bullae.      ABDOMEN:   Liver: Unremarkable. No mass.   Gallbladder and bile ducts: Unremarkable. No calcified stones. No ductal   dilatation.   Pancreas: Unremarkable. No ductal dilatation. No mass.   Spleen: Multiple unchanged splenic lesions.   Adrenals: Unremarkable. No mass.   Kidneys and ureters: 2 mm nonobstructing right kidney stone.   Appendix: No findings to suggest appendicitis.   Stomach and bowel: Stomach unremarkable. No obstruction. No mucosal thickening.   No inflammatory changes. Multiple sigmoid diverticula without diverticulitis.   Peritoneum: Unremarkable. No significant fluid collection. No free air.   Lymph nodes: Unremarkable. No enlarged lymph nodes.   Vasculature: Unremarkable. No aortic aneurysm.   Bones: Total right hip replacement.   Abdominal wall: Unremarkable. No evidence of a hernia.      PELVIS:   Bladder: Unremarkable. No mass.   Reproductive: Hysterectomy.      Impression   IMPRESSION:      1. Multiple sigmoid diverticula without diverticulitis.   2. 2 mm nonobstructing right kidney stone.   3. Total right hip replacement.   4. Hysterectomy.          [x] See  my orders for details      My assessment, plan of care, findings,  medications, side effects etc were discussed with:      [x]  nursing  []  PT/OT      [x]  respiratory therapy  [] Dr.        []   family  _0  Patient            Donavan Foil, MD

## 2021-01-25 NOTE — Care Coordination-Inpatient (Signed)
[facility]- Heart Failure Education    NN spoke to patient in 2019, Patient has long hx of asthma. Pleasant 70 y/o who reported she was in the [facility] crisis and since then she has had asthma.  Patient came to ED on 8/14 for L arm pain and edema- Dx with DVT as well as HTN urgency and Hypoxic. BP 177/116  Husband stated patient had DOE when amb with walker and LE edema. Patient uses home oxygen.     BNP 29-according to cardiology- inconsistent with acute congestive heart failure.  This seems more consistent with exacerbation of COPD.    CXR-IMPRESSION:  1. Severe emphysema left lung with stable opacity left mid chest representing  segmental collapse, unchanged.  2. No acute change.  3. Mildly ectatic ascending aorta. No change.       NN discussed with husband at bedside, he stated he is the care giver. Patient has one son who lives in [place]. Husband reported in the last 6 months patient does not get up out of bed much, she spends 80% of her time in bed, she has a bedside commode and her providers see her by telehealth.   Dr Allena Katz at bedside discussed her CO2 levels and possible need for a BIPAP machine at home.    Discussed COPD and HF  Husband is very informed on both, NN did give husband a floor scale for daily weights and we discussed her low salt diet. Husband organizes and gives all the medications. Husband stayed last night in the hospital assisting his wife.    Discussed with CM, Beverly    HF info, floor scale , low sodium diet info and HF plan given to husband at bedside. Husband given NN contact if any questions or concerns.    Husband will schedule telehealth with PCP- last blood draw was by [facility] nurse.
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
## 2021-01-25 NOTE — Unmapped (Signed)
Formatting of this note might be different from the original.  [facility]- Heart Failure Education    NN spoke to patient in 2019, Patient has long hx of asthma. Pleasant 70 y/o who reported she was in the [facility] crisis and since then she has had asthma.  Patient came to ED on 8/14 for L arm pain and edema- Dx with DVT as well as HTN urgency and Hypoxic. BP 177/116  Husband stated patient had DOE when amb with walker and LE edema. Patient uses home oxygen.     BNP 27-according to cardiology- inconsistent with acute congestive heart failure.  This seems more consistent with exacerbation of COPD.    CXR-IMPRESSION:  1. Severe emphysema left lung with stable opacity left mid chest representing  segmental collapse, unchanged.  2. No acute change.  3. Mildly ectatic ascending aorta. No change.      NN discussed with husband at bedside, he stated he is the care giver. Patient has one son who lives in [place]. Husband reported in the last 6 months patient does not get up out of bed much, she spends 80% of her time in bed, she has a bedside commode and her providers see her by telehealth.   Dr Allena Katz at bedside discussed her CO2 levels and possible need for a BIPAP machine at home.    Discussed COPD and HF  Husband is very informed on both, NN did give husband a floor scale for daily weights and we discussed her low salt diet. Husband organizes and gives all the medications. Husband stayed last night in the hospital assisting his wife.    Discussed with CM, Beverly    HF info, floor scale , low sodium diet info and HF plan given to husband at bedside. Husband given NN contact if any questions or concerns.    Husband will schedule telehealth with PCP- last blood draw was by [facility] nurse.      Electronically signed by Otho Najjar at 01/25/2021  2:20 PM EDT

## 2021-01-25 NOTE — Progress Notes (Signed)
Bedside shift change report given to Masha, RN (oncoming nurse) by Lorelle Formosa, RN (offgoing nurse). Report included the following information SBAR, Kardex, Intake/Output, MAR, Recent Results, and Cardiac Rhythm SR .

## 2021-01-25 NOTE — Progress Notes (Signed)
Progress Note    Patient: [name] [name]   Age:  70 y.o.  DOA: 01/24/2021   Admit Dx / CC: Acute CHF (congestive heart failure) ([place]) [I50.9]  LOS:  LOS: 1 day     Active Problems   1.  Acute on chronic hypoxic and hypercapnic respiratory failure  2.  COPD with acute exacerbation  3.  Acute DVT L arm  4.  Hypertensive urgency  5.  DM type II controlled  6.  Hypokalemia  7.  CHF chronic diastolic  8.  Obesity with possible OSA  Plan:   1.  Patient was seen and examined patient was still complaining of some shortness of breath.  Patient be continued on BiPAP.  Patient was told  BiPAP can be taken off for few minutes during meals.  We will continue frequent breathing treatments and IV steroids.  Pulmonary consult was done as well  2.  Patient diabetes is well controlled but due to IV steroids blood glucose is high patient was started on Glucomander protocol.  Patient's hemoglobin A1c was 8.7 a year ago which is now 5.5  3.  For better hypertension control patient was started on Norvasc 5 mg daily along with Hyzaar 100/25 mg daily.  Nitroglycerin drip will be weaned off.  Patient be started on Imdur 30 mg daily  4.  Patient had left arm Doppler done which showed acute DVT.  Patient was started on Eliquis 10 mg twice daily for a week followed by 5 mg twice daily  5.  Patient's previous medical record was reviewed in detail.  Patient's condition and management plan discussed with patient's husband in detail  6.  Further management depending on patient progress and response to the treatment  Subjective:   Complaining of some shortness of breath also has some cough denies any pain no nausea vomiting  Also feeling tired  Objective:   Visit Vitals  BP (!) 156/103   Pulse (!) 103   Temp 98.6 ??F (37 ??C)   Resp 18   Ht 5' 7" (1.702 m)   Wt 103.1 kg (227 lb 4.8 oz)   SpO2 96%   BMI 35.60 kg/m??       Physical Exam:  General appearance: Elderly female well-built in moderate distress on BiPAP  Head: Normocephalic, without obvious  abnormality, atraumatic  Neck: supple, trachea midline  Lungs: Scattered expiratory rhonchi's  Heart: Regular rhythm, no murmur, tachycardic  Abdomen: soft, non-tender. Bowel sounds Audible,  Extremities: Trace edema, distal pulses palpable  Skin: Warm and dry  Neurologic: Awake and alert    Intake and Output:  Current Shift:  08/15 0701 - 08/15 1900  In: 31.9 [I.V.:31.9]  Out: -   Last three shifts:  08/13 1901 - 08/15 0700  In: -   Out: 350 [Urine:350]    Lab/Data Reviewed:  Recent Results (from the past 48 hour(s))   CBC WITH AUTOMATED DIFF    Collection Time: 01/24/21  8:20 AM   Result Value Ref Range    WBC 12.3 (H) 4.0 - 11.0 1000/mm3    RBC 4.52 3.60 - 5.20 M/uL    HGB 12.8 (L) 13.0 - 17.2 gm/dl    HCT 40.9 37.0 - 50.0 %    MCV 90.5 80.0 - 98.0 fL    MCH 28.3 25.4 - 34.6 pg    MCHC 31.3 30.0 - 36.0 gm/dl    PLATELET 196 140 - 450 1000/mm3    MPV 11.0 (H) 6.0 - 10.0 fL  RDW-SD 47.8 (H) 36.4 - 46.3      NRBC 0 0 - 0      IMMATURE GRANULOCYTES 0.7 0.0 - 3.0 %    NEUTROPHILS 80.8 (H) 34 - 64 %    LYMPHOCYTES 13.1 (L) 28 - 48 %    MONOCYTES 4.3 1 - 13 %    EOSINOPHILS 0.8 0 - 5 %    BASOPHILS 0.3 0 - 3 %   METABOLIC PANEL, COMPREHENSIVE    Collection Time: 01/24/21  8:20 AM   Result Value Ref Range    Potassium 3.3 (L) 3.5 - 5.1 mEq/L    Chloride 98 98 - 107 mEq/L    Sodium 140 136 - 145 mEq/L    CO2 36 (H) 20 - 31 mEq/L    Glucose 198 (H) 74 - 106 mg/dl    BUN 10 9 - 23 mg/dl    Creatinine 0.75 0.55 - 1.02 mg/dl    GFR est AA >60.0      GFR est non-AA >60      Calcium 9.3 8.7 - 10.4 mg/dl    Anion gap 6 5 - 15 mmol/L    AST (SGOT) 12.0 0.0 - 33.9 U/L    ALT (SGPT) <7 (L) 10 - 49 U/L    Alk. phosphatase 81 46 - 116 U/L    Bilirubin, total 0.50 0.30 - 1.20 mg/dl    Protein, total 7.6 5.7 - 8.2 gm/dl    Albumin 3.8 3.4 - 5.0 gm/dl   TROPONIN-HIGH SENSITIVITY    Collection Time: 01/24/21  8:20 AM   Result Value Ref Range    Troponin-High Sensitivity <3 0 - 59 ng/L   NT-PRO BNP    Collection Time: 01/24/21  8:20 AM    Result Value Ref Range    NT pro-BNP 27 0 - 125     POC BLOOD GAS + LACTIC ACID    Collection Time: 01/24/21  9:07 AM   Result Value Ref Range    pH 7.375 7.350 - 7.450      PCO2 69.3 (HH) 35.0 - 45.0 mm Hg    PO2 61.0 (L) 75 - 100 mm Hg    BICARBONATE 40.5 (HH) 18.0 - 26.0 mmol/L    O2 SAT 89.0 (L) 90 - 100 %    CO2, TOTAL 43.0 (H) 24 - 29 mmol/L    Lactic Acid 1.14 0.40 - 2.00 mmol/L    BASE EXCESS 15 (H) -2 - 3 mmol/L    Patient temp. 98.7 F      Sample type Art      SITE R Radial      DEVICE Nasal Can      ALLENS TEST Pass      Liters/min. 4      NOTIFIED Doctor     TROPONIN-HIGH SENSITIVITY    Collection Time: 01/24/21 10:25 AM   Result Value Ref Range    Troponin-High Sensitivity <3 0 - 59 ng/L   TROPONIN-HIGH SENSITIVITY    Collection Time: 01/24/21 12:15 PM   Result Value Ref Range    Troponin-High Sensitivity 4 0 - 59 ng/L   POC BLOOD GAS + LACTIC ACID    Collection Time: 01/24/21  2:47 PM   Result Value Ref Range    pH 7.405 7.350 - 7.450      PCO2 66.3 (HH) 35.0 - 45.0 mm Hg    PO2 86.0 75 - 100 mm Hg    BICARBONATE 41.5 (HH) 18.0 - 26.0  mmol/L    O2 SAT 96.0 90 - 100 %    CO2, TOTAL 44.0 (H) 24 - 29 mmol/L    Lactic Acid 0.63 0.40 - 2.00 mmol/L    BASE EXCESS 17 (H) -2 - 3 mmol/L    Sample type Art      FIO2 35      SITE R Brachial      DEVICE BiPAP      ALLENS TEST Pass      IPAP/PIP 22      EPAP/CPAP/PEEP 10      Respiratory Rate 19      SET RATE 12      VTEXP 479     GLUCOSE, POC    Collection Time: 01/24/21  8:53 PM   Result Value Ref Range    Glucose (POC) 220 (H) 65 - 105 mg/dL   CBC WITH AUTOMATED DIFF    Collection Time: 01/25/21  4:07 AM   Result Value Ref Range    WBC 12.3 (H) 4.0 - 11.0 1000/mm3    RBC 4.66 3.60 - 5.20 M/uL    HGB 13.0 13.0 - 17.2 gm/dl    HCT 41.8 37.0 - 50.0 %    MCV 89.7 80.0 - 98.0 fL    MCH 27.9 25.4 - 34.6 pg    MCHC 31.1 30.0 - 36.0 gm/dl    PLATELET 232 140 - 450 1000/mm3    MPV 11.4 (H) 6.0 - 10.0 fL    RDW-SD 46.6 (H) 36.4 - 46.3      NRBC 0 0 - 0      IMMATURE  GRANULOCYTES 0.6 0.0 - 3.0 %    NEUTROPHILS 93.5 (H) 34 - 64 %    LYMPHOCYTES 5.1 (L) 28 - 48 %    MONOCYTES 0.7 (L) 1 - 13 %    EOSINOPHILS 0.0 0 - 5 %    BASOPHILS 0.1 0 - 3 %   METABOLIC PANEL, BASIC    Collection Time: 01/25/21  4:07 AM   Result Value Ref Range    Potassium 3.8 3.5 - 5.1 mEq/L    Chloride 96 (L) 98 - 107 mEq/L    Sodium 140 136 - 145 mEq/L    CO2 34 (H) 20 - 31 mEq/L    Glucose 213 (H) 74 - 106 mg/dl    BUN 15 9 - 23 mg/dl    Creatinine 0.74 0.55 - 1.02 mg/dl    GFR est AA >60.0      GFR est non-AA >60      Calcium 9.4 8.7 - 10.4 mg/dl    Anion gap 10 5 - 15 mmol/L   GLUCOSE, POC    Collection Time: 01/25/21  7:35 AM   Result Value Ref Range    Glucose (POC) 258 (H) 65 - 105 mg/dL   GLUCOSE, POC    Collection Time: 01/25/21 12:03 PM   Result Value Ref Range    Glucose (POC) 309 (H) 65 - 105 mg/dL   POC BLOOD GAS + LACTIC ACID    Collection Time: 01/25/21  3:24 PM   Result Value Ref Range    pH 7.420 7.350 - 7.450      PCO2 58.9 (H) 35.0 - 45.0 mm Hg    PO2 82.0 75 - 100 mm Hg    BICARBONATE 38.2 (HH) 18.0 - 26.0 mmol/L    O2 SAT 96.0 90 - 100 %    CO2, TOTAL 40.0 (H) 24 - 29 mmol/L  Lactic Acid 2.10 (H) 0.40 - 2.00 mmol/L    BASE EXCESS 14 (H) -2 - 3 mmol/L    Patient temp. 98.7 F      Sample type Art      FIO2 35      SITE R Radial      DEVICE BiPAP      ALLENS TEST Pass      IPAP/PIP 22      EPAP/CPAP/PEEP 10      Respiratory Rate 17      SET RATE 12      VTEXP 452     GLUCOSE, POC    Collection Time: 01/25/21  4:28 PM   Result Value Ref Range    Glucose (POC) 244 (H) 65 - 105 mg/dL       Imaging:  No results found.    Medications Reviewed:  Current Facility-Administered Medications   Medication Dose Route Frequency    amLODIPine (NORVASC) tablet 5 mg  5 mg Oral DAILY    losartan-hydroCHLOROthiazide (HYZAAR) 50-12.5 mg per tablet 2 Tablet  2 Tablet Oral DAILY    methylPREDNISolone (PF) (SOLU-MEDROL) injection 40 mg  40 mg IntraVENous Q12H    cloNIDine HCL (CATAPRES) tablet 0.2 mg  0.2 mg  Oral TID    [START ON 01/26/2021] tiotropium bromide (SPIRIVA RESPIMAT) 2.5 mcg /actuation  2 Puff Inhalation DAILY    nitroGLYcerin (TRIDIL) 400 mcg/ml infusion  10 mcg/min IntraVENous CONTINUOUS    allopurinoL (ZYLOPRIM) tablet 100 mg  100 mg Oral BID    montelukast (SINGULAIR) tablet 10 mg  10 mg Oral DAILY    rosuvastatin (CRESTOR) tablet 5 mg  5 mg Oral QHS    naloxone (NARCAN) injection 0.1 mg  0.1 mg IntraVENous PRN    acetaminophen (TYLENOL) tablet 650 mg  650 mg Oral Q4H PRN    Or    acetaminophen (TYLENOL) solution 650 mg  650 mg Oral Q4H PRN    Or    acetaminophen (TYLENOL) suppository 650 mg  650 mg Rectal Q4H PRN    ondansetron (ZOFRAN) injection 4 mg  4 mg IntraVENous Q4H PRN    apixaban (ELIQUIS) tablet 10 mg  10 mg Oral BID    [START ON 01/31/2021] apixaban (ELIQUIS) tablet 5 mg  5 mg Oral BID    dextrose (D50W) injection syrg 5-25 g  10-50 mL IntraVENous PRN    glucagon (GLUCAGEN) injection 1 mg  1 mg IntraMUSCular PRN    insulin glargine (LANTUS) injection 1-100 Units  1-100 Units SubCUTAneous QHS    insulin lispro (HUMALOG) injection 1-100 Units  1-100 Units SubCUTAneous AC&HS    insulin lispro (HUMALOG) injection 1-100 Units  1-100 Units SubCUTAneous PRN         Dragon medical dictation software was used for portions of this report. Unintended errors may occur.   Elon Spanner, MD  P# [phone]  January 25, 2021
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
## 2021-01-25 NOTE — Progress Notes (Signed)
Problem: Gas Exchange - Impaired  Goal: *Absence of hypoxia  Outcome: Progressing Towards Goal     Problem: Falls - Risk of  Goal: *Absence of Falls  Description: Document Schmid Fall Risk and appropriate interventions in the flowsheet.  Outcome: Progressing Towards Goal  Note: Fall Risk Interventions:  Mobility Interventions: Bed/chair exit alarm, Patient to call before getting OOB         Medication Interventions: Bed/chair exit alarm    Elimination Interventions: Bed/chair exit alarm, Call light in reach, Toileting schedule/hourly rounds

## 2021-01-26 LAB — POCT GLUCOSE
POC Glucose: 161 mg/dL — ABNORMAL HIGH (ref 65–105)
POC Glucose: 179 mg/dL — ABNORMAL HIGH (ref 65–105)
POC Glucose: 163 mg/dL — ABNORMAL HIGH (ref 65–105)
POC Glucose: 247 mg/dL — ABNORMAL HIGH (ref 65–105)

## 2021-01-26 LAB — GLUCOSE, POC
Glucose (POC): 161 mg/dL — ABNORMAL HIGH (ref 65–105)
Glucose (POC): 179 mg/dL — ABNORMAL HIGH (ref 65–105)
Glucose (POC): 163 mg/dL — ABNORMAL HIGH (ref 65–105)
Glucose (POC): 247 mg/dL — ABNORMAL HIGH (ref 65–105)

## 2021-01-26 MED ORDER — LOSARTAN 50 MG TAB
50 mg | Freq: Every day | ORAL | Status: DC
Start: 2021-01-26 — End: 2021-01-28
  Administered 2021-01-26 – 2021-01-28 (×3): via ORAL

## 2021-01-26 MED ORDER — POLYETHYLENE GLYCOL 3350 17 GRAM (100 %) ORAL POWDER PACKET
17 gram | Freq: Every day | ORAL | Status: DC
Start: 2021-01-26 — End: 2021-01-28
  Administered 2021-01-26 – 2021-01-28 (×3): via ORAL

## 2021-01-26 MED ORDER — SENNOSIDES-DOCUSATE SODIUM 8.6 MG-50 MG TAB
Freq: Every evening | ORAL | Status: DC
Start: 2021-01-26 — End: 2021-01-28
  Administered 2021-01-27 – 2021-01-28 (×2): via ORAL

## 2021-01-26 MED ORDER — SPIRONOLACTONE 25 MG TAB
25 mg | Freq: Every day | ORAL | Status: DC
Start: 2021-01-26 — End: 2021-01-28
  Administered 2021-01-27 – 2021-01-28 (×2): via ORAL

## 2021-01-26 MED ORDER — POLYETHYLENE GLYCOL 3350 17 GRAM (100 %) ORAL POWDER PACKET
17 gram | Freq: Once | ORAL | Status: AC
Start: 2021-01-26 — End: 2021-01-26
  Administered 2021-01-26: 11:00:00 via ORAL

## 2021-01-26 MED ORDER — QUETIAPINE 100 MG TAB
100 mg | Freq: Every evening | ORAL | Status: DC
Start: 2021-01-26 — End: 2021-01-28
  Administered 2021-01-27 – 2021-01-28 (×2): via ORAL

## 2021-01-26 MED FILL — CLONIDINE 0.2 MG TAB: 0.2 mg | ORAL | Qty: 1

## 2021-01-26 MED FILL — ALLOPURINOL 100 MG TAB: 100 mg | ORAL | Qty: 1

## 2021-01-26 MED FILL — ELIQUIS 5 MG TABLET: 5 mg | ORAL | Qty: 2

## 2021-01-26 MED FILL — ADMELOG U-100 INSULIN LISPRO 100 UNIT/ML SUBCUTANEOUS SOLUTION: 100 unit/mL | SUBCUTANEOUS | Qty: 1

## 2021-01-26 MED FILL — SOLU-MEDROL (PF) 40 MG/ML SOLUTION FOR INJECTION: 40 mg/mL | INTRAMUSCULAR | Qty: 1

## 2021-01-26 MED FILL — LOSARTAN-HYDROCHLOROTHIAZIDE 50 MG-12.5 MG TAB: ORAL | Qty: 1

## 2021-01-26 MED FILL — MONTELUKAST 10 MG TAB: 10 mg | ORAL | Qty: 1

## 2021-01-26 MED FILL — POLYETHYLENE GLYCOL 3350 17 GRAM (100 %) ORAL POWDER PACKET: 17 gram | ORAL | Qty: 1

## 2021-01-26 MED FILL — LOSARTAN 50 MG TAB: 50 mg | ORAL | Qty: 2

## 2021-01-26 MED FILL — AMLODIPINE 5 MG TAB: 5 mg | ORAL | Qty: 1

## 2021-01-26 MED FILL — ROSUVASTATIN 5 MG TAB: 5 mg | ORAL | Qty: 1

## 2021-01-26 NOTE — Unmapped (Signed)
Formatting of this note might be different from the original.  PAGER ID: [phone] (Dr. Tasia Catchings)  MESSAGE: [phone] [place], Patti- this is Divine on SDU [phone]. The pt is asking if SEROQUESL can be resumed? She takes 150mg  HS PRN. Pls advise, thank you.    Dr. put in his order for Seroquel tonight (see MAR).    [phone]- PAGER ID: Tasia Catchings. Ahmed)  MESSAGE: [phone] [place], [place]- this is Divine again on SDU [phone]. Her diastolic BP is at 100s, right now iBP is 140/104 after Clonidine 0.2mg  was given. Do you want to add PRN BP meds? Thank you.  Electronically signed by #[id number] at 01/26/2021  4:38 PM EDT

## 2021-01-26 NOTE — Progress Notes (Signed)
Progress Note    Patient: [name] [name]   Age:  70 y.o.  DOA: 01/24/2021   Admit Dx / CC:   LOS:  LOS: 2 days     Active Problems   1.  Acute on chronic hypoxic and hypercapnic respiratory failure  2.  COPD with acute exacerbation  3.  Acute DVT L arm  4.  Hypertensive urgency  5.  DM type II controlled  6.  Hypokalemia  7.  CHF chronic diastolic  8.  Obesity with possible OSA  9.  Metabolic alkalosis  10.  Constipation  Plan:   1.  Patient was seen and examined patient was feeling little better  We will try to wean her off from BiPAP.  Goal is to keep oxygen saturation more than 89%.  PT/OT consult done patient was encouraged to participate  2.  For COPD exacerbation patient will be continued on IV steroids and frequent breathing treatments  3.  Patient has significant metabolic alkalosis.  Hydrochlorothiazide was discontinued.  Patient was started on spironolactone 25 mg daily.  Basic metabolic profile will be repeated in a.m.  4.  Patient had left arm Doppler done which showed acute DVT.  Patient was started on Eliquis 10 mg twice daily for a week followed by 5 mg twice daily  5.  For constipation patient was started on Senokot-S 2 tablets at night and MiraLAX 17 g daily.  Patient was also advised to drink prune juice  6.  Patient's condition and management plan discussed with patient's husband in detail.  Discharge planning once medically stable likely next 2 to 3 days  Subjective:   Feeling little better, shortness of breath started improving  She was complaining of constipation no bowel movement for 3 days  Denies any nausea vomiting but has decreased appetite  Objective:   Visit Vitals  BP (!) 149/94   Pulse 92   Temp 98.1 ??F (36.7 ??C)   Resp 23   Ht 5\' 7"  (1.702 m)   Wt 103.1 kg (227 lb 4.8 oz)   SpO2 95%   BMI 35.60 kg/m??       Physical Exam:  General appearance: Elderly female well-built in looking sick on BiPAP  Lungs: Good air entry on both sides  Heart: Regular rhythm, no murmur  Abdomen: soft,  non-tender. Bowel sounds Audible,  Neurologic: Awake and alert  Answering questions appropriately  Intake and Output:  Current Shift:  No intake/output data recorded.  Last three shifts:  08/14 1901 - 08/16 0700  In: 43.3 [I.V.:43.3]  Out: 350 [Urine:350]    Lab/Data Reviewed:  Recent Results (from the past 48 hour(s))   GLUCOSE, POC    Collection Time: 01/24/21  8:53 PM   Result Value Ref Range    Glucose (POC) 220 (H) 65 - 105 mg/dL   CBC WITH AUTOMATED DIFF    Collection Time: 01/25/21  4:07 AM   Result Value Ref Range    WBC 12.3 (H) 4.0 - 11.0 1000/mm3    RBC 4.66 3.60 - 5.20 M/uL    HGB 13.0 13.0 - 17.2 gm/dl    HCT 01/27/21 40.8 - 14.4 %    MCV 89.7 80.0 - 98.0 fL    MCH 27.9 25.4 - 34.6 pg    MCHC 31.1 30.0 - 36.0 gm/dl    PLATELET 81.8 563 - 149 1000/mm3    MPV 11.4 (H) 6.0 - 10.0 fL    RDW-SD 46.6 (H) 36.4 - 46.3  NRBC 0 0 - 0      IMMATURE GRANULOCYTES 0.6 0.0 - 3.0 %    NEUTROPHILS 93.5 (H) 34 - 64 %    LYMPHOCYTES 5.1 (L) 28 - 48 %    MONOCYTES 0.7 (L) 1 - 13 %    EOSINOPHILS 0.0 0 - 5 %    BASOPHILS 0.1 0 - 3 %   METABOLIC PANEL, BASIC    Collection Time: 01/25/21  4:07 AM   Result Value Ref Range    Potassium 3.8 3.5 - 5.1 mEq/L    Chloride 96 (L) 98 - 107 mEq/L    Sodium 140 136 - 145 mEq/L    CO2 34 (H) 20 - 31 mEq/L    Glucose 213 (H) 74 - 106 mg/dl    BUN 15 9 - 23 mg/dl    Creatinine 8.12 7.51 - 1.02 mg/dl    GFR est AA >70.0      GFR est non-AA >60      Calcium 9.4 8.7 - 10.4 mg/dl    Anion gap 10 5 - 15 mmol/L   GLUCOSE, POC    Collection Time: 01/25/21  7:35 AM   Result Value Ref Range    Glucose (POC) 258 (H) 65 - 105 mg/dL   GLUCOSE, POC    Collection Time: 01/25/21 12:03 PM   Result Value Ref Range    Glucose (POC) 309 (H) 65 - 105 mg/dL   POC BLOOD GAS + LACTIC ACID    Collection Time: 01/25/21  3:24 PM   Result Value Ref Range    pH 7.420 7.350 - 7.450      PCO2 58.9 (H) 35.0 - 45.0 mm Hg    PO2 82.0 75 - 100 mm Hg    BICARBONATE 38.2 (HH) 18.0 - 26.0 mmol/L    O2 SAT 96.0 90 - 100 %     CO2, TOTAL 40.0 (H) 24 - 29 mmol/L    Lactic Acid 2.10 (H) 0.40 - 2.00 mmol/L    BASE EXCESS 14 (H) -2 - 3 mmol/L    Patient temp. 98.7 F      Sample type Art      FIO2 35      SITE R Radial      DEVICE BiPAP      ALLENS TEST Pass      IPAP/PIP 22      EPAP/CPAP/PEEP 10      Respiratory Rate 17      SET RATE 12      VTEXP 452     GLUCOSE, POC    Collection Time: 01/25/21  4:28 PM   Result Value Ref Range    Glucose (POC) 244 (H) 65 - 105 mg/dL   GLUCOSE, POC    Collection Time: 01/25/21  9:10 PM   Result Value Ref Range    Glucose (POC) 161 (H) 65 - 105 mg/dL   GLUCOSE, POC    Collection Time: 01/26/21  7:27 AM   Result Value Ref Range    Glucose (POC) 179 (H) 65 - 105 mg/dL   GLUCOSE, POC    Collection Time: 01/26/21 11:52 AM   Result Value Ref Range    Glucose (POC) 163 (H) 65 - 105 mg/dL   GLUCOSE, POC    Collection Time: 01/26/21  5:03 PM   Result Value Ref Range    Glucose (POC) 247 (H) 65 - 105 mg/dL       Imaging:  DUPLEX UPPER EXT VENOUS  LEFT    Result Date: 01/26/2021  [id number]                                                           Study ID: [id number]                                        [facility]                                           [address] [place],                                         [place]                                          [address]                    Upper Extremity Venous Duplex Report Name: [name] [name], [name] [name]         Study Date: 01/24/2021 09:27 AM MRN: [id number]                   Patient Location: UX^[id number]^[id number] DOB: 07/03/1950               Age: 40 yrs Gender: Female                Account #: [id number] Reason For Study: Left arm pain and swelling Ordering Physician: Algis Downs Referring Physician: Renae Fickle E Performed By:  Darrall Dears, RVT Interpretation Summary 1. Acute deep vein thrombosis left upper extremity with occlusive thrombus in the subclavian vein. 2. No further evidence of deep vein thrombosis in the left internal jugular, axillary, brachial, radial and ulnar  veins. 3. No evidence of deep vein thrombosis in the contralateral/right internal jugular and subclavian veins. ___________________________________________________________________________ QUALITY/PROCEDURE Venous duplex ultrasound of the left upper extremity. ICD-10. M79.602. M79.89. HISTORY/SYMPTOMS Diabetes. HTN. COPD. Hepatitis. GERD. Chronic Pain. RIGHT ARM The right internal jugular and subclavian were patent and free of thrombus with normal venous hemodynamics. LEFT ARM The left upper extremity was examined with duplex ultrasound. B-mode imaging demonstated normal compressibility of the internal jugular, axillary, brachial, radial, and ulnar veins with no evidence of intraluminal thrombus. There was hypoechoic occlusive thrombus throughout the subclavian vein with absent Doppler venous flow. Diminished spontaneous Doppler venous flow was present in the veins proximal to the subclavian vein thrombosis. LEFT SUPERFICIAL VEINS The cephalic and basilic veins were patent, compressible and free of thrombus with spontaneous venous flow. SONOGRAPHERS COMMENTS Preliminary findings called to Dr. Renae FickleMichael Stull. Electronically signed byDr. Neta Ehlersasesh Shah, M.D   01/26/2021 10:08 AM      Medications Reviewed:  Current Facility-Administered Medications   Medication Dose Route Frequency    losartan (COZAAR) tablet 100 mg  100 mg Oral DAILY    senna-docusate (PERICOLACE) 8.6-50 mg per tablet 2 Tablet  2 Tablet Oral QHS    polyethylene glycol (MIRALAX) packet 17 g  17 g Oral DAILY    QUEtiapine (SEROquel) tablet 100 mg  100 mg Oral QHS    amLODIPine (NORVASC) tablet 5 mg  5 mg Oral DAILY    methylPREDNISolone (PF) (SOLU-MEDROL) injection 40 mg  40 mg IntraVENous Q12H    cloNIDine  HCL (CATAPRES) tablet 0.2 mg  0.2 mg Oral TID    tiotropium bromide (SPIRIVA RESPIMAT) 2.5 mcg /actuation  2 Puff Inhalation DAILY    allopurinoL (ZYLOPRIM) tablet 100 mg  100 mg Oral BID    montelukast (SINGULAIR) tablet 10 mg  10 mg Oral DAILY    rosuvastatin (CRESTOR) tablet 5 mg  5 mg Oral QHS    naloxone (NARCAN) injection 0.1 mg  0.1 mg IntraVENous PRN    acetaminophen (TYLENOL) tablet 650 mg  650 mg Oral Q4H PRN    Or    acetaminophen (TYLENOL) solution 650 mg  650 mg Oral Q4H PRN    Or    acetaminophen (TYLENOL) suppository 650 mg  650 mg Rectal Q4H PRN    ondansetron (ZOFRAN) injection 4 mg  4 mg IntraVENous Q4H PRN    apixaban (ELIQUIS) tablet 10 mg  10 mg Oral BID    [START ON 01/31/2021] apixaban (ELIQUIS) tablet 5 mg  5 mg Oral BID    dextrose (D50W) injection syrg 5-25 g  10-50 mL IntraVENous PRN    glucagon (GLUCAGEN) injection 1 mg  1 mg IntraMUSCular PRN    insulin glargine (LANTUS) injection 1-100 Units  1-100 Units SubCUTAneous QHS    insulin lispro (HUMALOG) injection 1-100 Units  1-100 Units SubCUTAneous AC&HS    insulin lispro (HUMALOG) injection 1-100 Units  1-100 Units SubCUTAneous PRN         Dragon medical dictation software was used for portions of this report. Unintended errors may occur.   Hazel Samsahir Natalyah Cummiskey, MD  P# [phone]  January 26, 2021

## 2021-01-26 NOTE — Progress Notes (Signed)
Formatting of this note might be different from the original.    Problem: Gas Exchange - Impaired  Goal: *Absence of hypoxia  Outcome: Progressing Towards Goal    Problem: Patient Education: Go to Patient Education Activity  Goal: Patient/Family Education  Outcome: Progressing Towards Goal    Problem: Falls - Risk of  Goal: *Absence of Falls  Description: Document [name] Fall Risk and appropriate interventions in the flowsheet.  Outcome: Progressing Towards Goal  Note: Fall Risk Interventions:  Mobility Interventions: Assess mobility with egress test, Bed/chair exit alarm, Patient to call before getting OOB, PT Consult for assist device competence, Strengthening exercises (ROM-active/passive)        Medication Interventions: Bed/chair exit alarm, Patient to call before getting OOB, Teach patient to arise slowly    Elimination Interventions: Bed/chair exit alarm, Elevated toilet seat, Stay With Me (per policy), Toilet paper/wipes in reach          Electronically signed by Huey Romans at 01/26/2021 10:52 AM EDT

## 2021-01-26 NOTE — Progress Notes (Signed)
OCCUPATIONAL THERAPY         Patient: [name] [name] (70 y.o. female)  Room: 3213/3213    Primary Diagnosis: Acute CHF (congestive heart failure) (HCC) [I50.9]       Date of Admission: 01/24/2021   Length of Stay:  2 day(s)  Insurance: Payor: HU[employer]NA MEDICARE / Plan: [facility] HU[employer]NA MEDICARE HMO / Product Type: Managed Care Medicare /      Date: 01/26/2021  Time: 1135    OBJECTIVE:     Orders, labs, and chart reviewed on Oluwatosin R Reading. Communicated with nursing staff.      Occupational Therapy was attempted, however patient on BiPap after being on 2L O2 via nc earlier.     PLAN:     Our services will follow up as indicated.     Charisse Klinefelter, OT  January 26, 2021

## 2021-01-26 NOTE — Progress Notes (Signed)
Formatting of this note is different from the original.  OCCUPATIONAL THERAPY      Patient: [name] [name] (70 y.o. female)  Room: 3213/3213    Primary Diagnosis: Acute CHF (congestive heart failure) (HCC) [I50.9]    Date of Admission: 01/24/2021   Length of Stay:  2 day(s)  Insurance: Payor: HUMANA MEDICARE / Plan: [facility] HUMANA MEDICARE HMO / Product Type: Managed Care Medicare /      Date: 01/26/2021  Time: 1135    OBJECTIVE:     Orders, labs, and chart reviewed on [name] [name]. Communicated with nursing staff.     Occupational Therapy was attempted, however patient on BiPap after being on 2L O2 via nc earlier.     PLAN:     Our services will follow up as indicated.     Charisse Klinefelter, OT  January 26, 2021      Electronically signed by Charisse Klinefelter, OT at 01/26/2021 11:44 AM EDT

## 2021-01-26 NOTE — Progress Notes (Signed)
Formatting of this note is different from the original.    PHYSICAL THERAPY      Patient: [name] [name] (70 y.o. female)  Room: 3213/3213    Date: 01/26/2021  Time:  11:34  Primary Diagnosis: Acute CHF (congestive heart failure) (HCC) [I50.9]    Orders reviewed, chart reviewed.   Communicated with patient's nurse, respiratory therapist.    OBJECTIVE:     Physical therapy was attempted, however  held d/t increased respiratory support at this time . Per RN & RT patient has been placed on BiPAP after being on 2L NC earlier this AM.    PT will continue to monitor patient's status and follow up as schedule/patient status allow.    PLAN:     Our services will follow up as patient's condition and/or schedule permits.    Jacquenette Shone, PT, DPT    Electronically signed by Jacquenette Shone, PT at 01/26/2021 12:03 PM EDT

## 2021-01-26 NOTE — Progress Notes (Signed)
Formatting of this note is different from the original.  Progress Note    Patient: [name] [name]   Age:  69 y.o.  DOA: 01/24/2021   Admit Dx / CC:   LOS:  LOS: 2 days     Active Problems   1.  Acute on chronic hypoxic and hypercapnic respiratory failure  2.  COPD with acute exacerbation  3.  Acute DVT L arm  4.  Hypertensive urgency  5.  DM type II controlled  6.  Hypokalemia  7.  CHF chronic diastolic  8.  Obesity with possible OSA  9.  Metabolic alkalosis  10.  Constipation  Plan:   1.  Patient was seen and examined patient was feeling little better  We will try to wean her off from BiPAP.  Goal is to keep oxygen saturation more than 89%.  PT/OT consult done patient was encouraged to participate  2.  For COPD exacerbation patient will be continued on IV steroids and frequent breathing treatments  3.  Patient has significant metabolic alkalosis.  Hydrochlorothiazide was discontinued.  Patient was started on spironolactone 25 mg daily.  Basic metabolic profile will be repeated in a.m.  4.  Patient had left arm Doppler done which showed acute DVT.  Patient was started on Eliquis 10 mg twice daily for a week followed by 5 mg twice daily  5.  For constipation patient was started on Senokot-S 2 tablets at night and MiraLAX 17 g daily.  Patient was also advised to drink prune juice  6.  Patient's condition and management plan discussed with patient's husband in detail.  Discharge planning once medically stable likely next 2 to 3 days  Subjective:   Feeling little better, shortness of breath started improving  She was complaining of constipation no bowel movement for 3 days  Denies any nausea vomiting but has decreased appetite  Objective:   Visit Vitals  BP (!) 149/94   Pulse 92   Temp 98.1 F (36.7 C)   Resp 23   Ht 5\' 7"  (1.702 m)   Wt 103.1 kg (227 lb 4.8 oz)   SpO2 95%   BMI 35.60 kg/m     Physical Exam:  General appearance: Elderly female well-built in looking sick on BiPAP  Lungs: Good air entry on both  sides  Heart: Regular rhythm, no murmur  Abdomen: soft, non-tender. Bowel sounds Audible,  Neurologic: Awake and alert  Answering questions appropriately  Intake and Output:  Current Shift:  No intake/output data recorded.  Last three shifts:  08/14 1901 - 08/16 0700  In: 43.3 [I.V.:43.3]  Out: 350 [Urine:350]    Lab/Data Reviewed:  Recent Results (from the past 48 hour(s))   GLUCOSE, POC    Collection Time: 01/24/21  8:53 PM   Result Value Ref Range    Glucose (POC) 220 (H) 65 - 105 mg/dL   CBC WITH AUTOMATED DIFF    Collection Time: 01/25/21  4:07 AM   Result Value Ref Range    WBC 12.3 (H) 4.0 - 11.0 1000/mm3    RBC 4.66 3.60 - 5.20 M/uL    HGB 13.0 13.0 - 17.2 gm/dl    HCT 01/27/21 81.1 - 91.4 %    MCV 89.7 80.0 - 98.0 fL    MCH 27.9 25.4 - 34.6 pg    MCHC 31.1 30.0 - 36.0 gm/dl    PLATELET 78.2 956 - 213 1000/mm3    MPV 11.4 (H) 6.0 - 10.0 fL  RDW-SD 46.6 (H) 36.4 - 46.3      NRBC 0 0 - 0      IMMATURE GRANULOCYTES 0.6 0.0 - 3.0 %    NEUTROPHILS 93.5 (H) 34 - 64 %    LYMPHOCYTES 5.1 (L) 28 - 48 %    MONOCYTES 0.7 (L) 1 - 13 %    EOSINOPHILS 0.0 0 - 5 %    BASOPHILS 0.1 0 - 3 %   METABOLIC PANEL, BASIC    Collection Time: 01/25/21  4:07 AM   Result Value Ref Range    Potassium 3.8 3.5 - 5.1 mEq/L    Chloride 96 (L) 98 - 107 mEq/L    Sodium 140 136 - 145 mEq/L    CO2 34 (H) 20 - 31 mEq/L    Glucose 213 (H) 74 - 106 mg/dl    BUN 15 9 - 23 mg/dl    Creatinine 7.01 7.79 - 1.02 mg/dl    GFR est AA >39.0      GFR est non-AA >60      Calcium 9.4 8.7 - 10.4 mg/dl    Anion gap 10 5 - 15 mmol/L   GLUCOSE, POC    Collection Time: 01/25/21  7:35 AM   Result Value Ref Range    Glucose (POC) 258 (H) 65 - 105 mg/dL   GLUCOSE, POC    Collection Time: 01/25/21 12:03 PM   Result Value Ref Range    Glucose (POC) 309 (H) 65 - 105 mg/dL   POC BLOOD GAS + LACTIC ACID    Collection Time: 01/25/21  3:24 PM   Result Value Ref Range    pH 7.420 7.350 - 7.450      PCO2 58.9 (H) 35.0 - 45.0 mm Hg    PO2 82.0 75 - 100 mm Hg    BICARBONATE 38.2  (HH) 18.0 - 26.0 mmol/L    O2 SAT 96.0 90 - 100 %    CO2, TOTAL 40.0 (H) 24 - 29 mmol/L    Lactic Acid 2.10 (H) 0.40 - 2.00 mmol/L    BASE EXCESS 14 (H) -2 - 3 mmol/L    Patient temp. 98.7 F      Sample type Art      FIO2 35      SITE R Radial      DEVICE BiPAP      ALLENS TEST Pass      IPAP/PIP 22      EPAP/CPAP/PEEP 10      Respiratory Rate 17      SET RATE 12      VTEXP 452     GLUCOSE, POC    Collection Time: 01/25/21  4:28 PM   Result Value Ref Range    Glucose (POC) 244 (H) 65 - 105 mg/dL   GLUCOSE, POC    Collection Time: 01/25/21  9:10 PM   Result Value Ref Range    Glucose (POC) 161 (H) 65 - 105 mg/dL   GLUCOSE, POC    Collection Time: 01/26/21  7:27 AM   Result Value Ref Range    Glucose (POC) 179 (H) 65 - 105 mg/dL   GLUCOSE, POC    Collection Time: 01/26/21 11:52 AM   Result Value Ref Range    Glucose (POC) 163 (H) 65 - 105 mg/dL   GLUCOSE, POC    Collection Time: 01/26/21  5:03 PM   Result Value Ref Range    Glucose (POC) 247 (H) 65 - 105 mg/dL  Imaging:  DUPLEX UPPER EXT VENOUS LEFT    Result Date: 01/26/2021  [id number]                                                           Study ID: [id number]                                        [facility]                                           [address] [place]                                       [place],                                         [place]                                          [id number]                    Upper Extremity Venous Duplex Report Name: [name] ShutterOWENS, [name] R         Study Date: 01/24/2021 09:27 AM MRN: [id number]                   Patient Location: VF^IE33^IR51^[facility]^ER23^ER23^[facility] DOB: 04/17/1951               Age: [age] yrs Gender: Female                Account #: [id number] Reason For Study: Left arm pain and swelling Ordering Physician: Algis DownsSTULL, MICHAEL E  Referring Physician: Renae FickleSTULL, MICHAEL E Performed By: Darrall DearsGodsey, Denise, RVT Interpretation Summary 1. Acute deep vein thrombosis left upper extremity with occlusive thrombus in the subclavian vein. 2. No further evidence of deep vein thrombosis in the left internal  jugular, axillary, brachial, radial and ulnar veins. 3. No evidence of deep vein thrombosis in the contralateral/right internal jugular and subclavian veins. ___________________________________________________________________________ QUALITY/PROCEDURE Venous duplex ultrasound of the left upper extremity. ICD-10. M79.602. M79.89. HISTORY/SYMPTOMS Diabetes. HTN. COPD. Hepatitis. GERD. Chronic Pain. RIGHT ARM The right internal jugular and subclavian were patent and free of thrombus with normal venous hemodynamics. LEFT ARM The left upper extremity was examined with duplex ultrasound. B-mode imaging demonstated normal compressibility of the internal jugular, axillary, brachial, radial, and ulnar veins with no evidence of intraluminal thrombus. There was hypoechoic occlusive thrombus throughout the subclavian vein with absent Doppler venous flow. Diminished spontaneous Doppler venous flow was present in the veins proximal to the subclavian vein thrombosis. LEFT SUPERFICIAL VEINS The cephalic and basilic veins were patent, compressible and free of thrombus with spontaneous venous flow. SONOGRAPHERS COMMENTS Preliminary findings called to Dr. Renae Fickle. Electronically signed byDr. Neta Ehlers, M.D   01/26/2021 10:08 AM      Medications Reviewed:  Current Facility-Administered Medications   Medication Dose Route Frequency    losartan (COZAAR) tablet 100 mg  100 mg Oral DAILY    senna-docusate (PERICOLACE) 8.6-50 mg per tablet 2 Tablet  2 Tablet Oral QHS    polyethylene glycol (MIRALAX) packet 17 g  17 g Oral DAILY    QUEtiapine (SEROquel) tablet 100 mg  100 mg Oral QHS    amLODIPine (NORVASC) tablet 5 mg  5 mg Oral DAILY    methylPREDNISolone (PF) (SOLU-MEDROL)  injection 40 mg  40 mg IntraVENous Q12H    cloNIDine HCL (CATAPRES) tablet 0.2 mg  0.2 mg Oral TID    tiotropium bromide (SPIRIVA RESPIMAT) 2.5 mcg /actuation  2 Puff Inhalation DAILY    allopurinoL (ZYLOPRIM) tablet 100 mg  100 mg Oral BID    montelukast (SINGULAIR) tablet 10 mg  10 mg Oral DAILY    rosuvastatin (CRESTOR) tablet 5 mg  5 mg Oral QHS    naloxone (NARCAN) injection 0.1 mg  0.1 mg IntraVENous PRN    acetaminophen (TYLENOL) tablet 650 mg  650 mg Oral Q4H PRN    Or    acetaminophen (TYLENOL) solution 650 mg  650 mg Oral Q4H PRN    Or    acetaminophen (TYLENOL) suppository 650 mg  650 mg Rectal Q4H PRN    ondansetron (ZOFRAN) injection 4 mg  4 mg IntraVENous Q4H PRN    apixaban (ELIQUIS) tablet 10 mg  10 mg Oral BID    [START ON 01/31/2021] apixaban (ELIQUIS) tablet 5 mg  5 mg Oral BID    dextrose (D50W) injection syrg 5-25 g  10-50 mL IntraVENous PRN    glucagon (GLUCAGEN) injection 1 mg  1 mg IntraMUSCular PRN    insulin glargine (LANTUS) injection 1-100 Units  1-100 Units SubCUTAneous QHS    insulin lispro (HUMALOG) injection 1-100 Units  1-100 Units SubCUTAneous AC&HS    insulin lispro (HUMALOG) injection 1-100 Units  1-100 Units SubCUTAneous PRN     Dragon medical dictation software was used for portions of this report. Unintended errors may occur.   [name], MD  P# [phone]  January 26, 2021   Electronically signed by [name], MD at 01/26/2021  6:38 PM EDT

## 2021-01-26 NOTE — Progress Notes (Signed)
Problem: Gas Exchange - Impaired  Goal: *Absence of hypoxia  Outcome: Progressing Towards Goal     Problem: Patient Education: Go to Patient Education Activity  Goal: Patient/Family Education  Outcome: Progressing Towards Goal     Problem: Falls - Risk of  Goal: *Absence of Falls  Description: Document [name] [name] Fall Risk and appropriate interventions in the flowsheet.  Outcome: Progressing Towards Goal  Note: Fall Risk Interventions:  Mobility Interventions: Assess mobility with egress test, Bed/chair exit alarm, Patient to call before getting OOB, PT Consult for assist device competence, Strengthening exercises (ROM-active/passive)         Medication Interventions: Bed/chair exit alarm, Patient to call before getting OOB, Teach patient to arise slowly    Elimination Interventions: Bed/chair exit alarm, Elevated toilet seat, Stay With Me (per policy), Toilet paper/wipes in reach

## 2021-01-26 NOTE — Progress Notes (Signed)
Formatting of this note is different from the original.  Images from the original note were not included.      [facility]     Name: [name] [name] MRN: [id number]   DOB: June 22, 1950 Hospital: [facility]   Date: 01/26/2021        IMPRESSION:   Worsening shortness of breath, shortness of breath, and wheezing due to COPD with exacerbation  ABG demonstrated primary metabolic alkalosis, and secondary respiratory acidosis  Asthma/COPD with exacerbation  Venous duplex of upper extremity obtained on 01/24/2021 demonstrated acute deep venous thrombosis of left upper extremity with occlusive thrombus in subclavian vein  GERD  PTSD     PLAN:   Continue BiPAP on regular basis during nighttime, and as needed basis during daytime.  Titrate supplemental oxygen through nasal cannula, and target oxygen saturation greater than 88%  Decrease Solu-Medrol 40 mg daily .  If remains stable switch to p.o. prednisone  Discontinue hydrochlorothiazide from Hyzaar.  Continue losartan  Continue Spiriva 2.5 mcg 2 inhalations a day, and  Breo 100/25 mcg 1 inhalation  a day (patient is on Advair 100/50 twice a day).  Continue DuoNeb through nebulizer every 6 hours.  Avoid excessive diuresis in light of ABG demonstrating metabolic alkalosis, this may deteriorate PCO2 retention  DVT prophylaxis: On Eliquis for acute DVT     Subjective/Interval History:     Review of Systems   Unable to perform ROS: Other     Objective:   Vital Signs:    Visit Vitals  BP (!) 159/76   Pulse 86   Temp 98.1 F (36.7 C)   Resp 13   Ht 5\' 7"  (1.702 m)   Wt 103.1 kg (227 lb 4.8 oz)   SpO2 94%   BMI 35.60 kg/m     O2 Device: Nasal cannula   O2 Flow Rate (L/min): 2 l/min   Temp (24hrs), Avg:98.3 F (36.8 C), Min:97.5 F (36.4 C), Max:98.7 F (37.1 C)      Intake/Output:   Last shift:      No intake/output data recorded.  Last 3 shifts: 08/14 1901 - 08/16 0700  In: 43.3 [I.V.:43.3]  Out: 350  [Urine:350]    Intake/Output Summary (Last 24 hours) at 01/26/2021 0949  Last data filed at 01/25/2021 1848  Gross per 24 hour   Intake 11.33 ml   Output --   Net 11.33 ml       Physical Exam  Vitals and nursing note reviewed.   HENT:      Mouth/Throat:      Mouth: Mucous membranes are moist.   Eyes:      Pupils: Pupils are equal, round, and reactive to light.   Cardiovascular:      Rate and Rhythm: Regular rhythm.   Pulmonary:      Breath sounds: No wheezing.   Abdominal:      General: Bowel sounds are normal.      Palpations: Abdomen is soft.   Musculoskeletal:         General: No swelling.   Skin:     General: Skin is warm and dry.   Neurological:      General: No focal deficit present.      Mental Status: She is alert and oriented to person, place, and time.       DATA:  Labs:  Recent Labs     01/25/21  0407 01/24/21  0820   WBC 12.3* 12.3*  HGB 13.0 12.8*   HCT 41.8 40.9   PLT 232 196     Recent Labs     01/25/21  1524 01/25/21  0407 01/24/21  1447 01/24/21  0907 01/24/21  0820   NA  --  140  --   --  140   K  --  3.8  --   --  3.3*   CL  --  96*  --   --  98   CO2 40.0* 34* 44.0*   < > 36*   GLU  --  213*  --   --  198*   BUN  --  15  --   --  10   CREA  --  0.74  --   --  0.75   CA  --  9.4  --   --  9.3   ALB  --   --   --   --  3.8   ALT  --   --   --   --  <7*    < > = values in this interval not displayed.     Recent Labs     01/25/21  1524 01/24/21  1447 01/24/21  0907   PH 7.420 7.405 7.375   PCO2 58.9* 66.3* 69.3*   PO2 82.0 86.0 61.0*   HCO3 38.2* 41.5* 40.5*   FIO2 35 35  --      All Micro Results       None           Imaging:  [] I have personally reviewed the patient?s radiographs  [] Radiographs reviewed with radiologist   [] No change from prior, tubes and lines in adequate position  [] Improved   [] Worsening    , MD    Electronically signed by , MD at 01/26/2021  6:08 PM EDT

## 2021-01-26 NOTE — Progress Notes (Signed)
CARDIOLOGY DAILY PROGRESS NOTE      Patient: [name] [name] MRN: [id number]  SSN: [id number]    Date of Birth: 10/23/1950  Age: 70 y.o.  Sex: female      Admit Date: 01/24/2021    LOS: 2 days     Cardiologist:  PCP: Lennox GrumblesBrock, Monique, PA-C       Impression:   Chronic obstructive pulmonary disease with exacerbation left upper extremity DVT   Hypertensive urgency  Type 2 diabetes  Chronic heart failure with preserved systolic function        Plan:     Hypertension  Improved.  Continue current therapy.  Could increase amlodipine to 10 mg if further antihypertensive therapy is needed.    No evidence of heart failure.    COPD exacerbation  Improving from respiratory standpoint.  Management per pulmonary.    Left upper extremity DVT  Tolerating DOAC for anticoagulation.    Please call with any additional questions or concerns.  Cardiology will sign off for now.      Reason for visit:     Follow-up visit for uncontrolled hypertension  Interval History:     Intermittently on BiPAP currently transition to nasal cannula at 2 L  Blood pressure has improved  Now off nitroglycerin drip    ROS:     Gen: negative for fever or chill  Pulmonary: negative for dyspnea or cough  Cardiac: negative for chest pain, negative for palpitations    Current Facility-Administered Medications   Medication Dose Route Frequency    losartan (COZAAR) tablet 100 mg  100 mg Oral DAILY    senna-docusate (PERICOLACE) 8.6-50 mg per tablet 2 Tablet  2 Tablet Oral QHS    polyethylene glycol (MIRALAX) packet 17 g  17 g Oral DAILY    QUEtiapine (SEROquel) tablet 100 mg  100 mg Oral QHS    amLODIPine (NORVASC) tablet 5 mg  5 mg Oral DAILY    methylPREDNISolone (PF) (SOLU-MEDROL) injection 40 mg  40 mg IntraVENous Q12H    cloNIDine HCL (CATAPRES) tablet 0.2 mg  0.2 mg Oral TID    tiotropium bromide (SPIRIVA RESPIMAT) 2.5 mcg /actuation  2 Puff Inhalation DAILY    allopurinoL (ZYLOPRIM) tablet 100 mg  100 mg Oral BID    montelukast (SINGULAIR) tablet 10 mg  10 mg Oral  DAILY    rosuvastatin (CRESTOR) tablet 5 mg  5 mg Oral QHS    apixaban (ELIQUIS) tablet 10 mg  10 mg Oral BID    [START ON 01/31/2021] apixaban (ELIQUIS) tablet 5 mg  5 mg Oral BID    insulin glargine (LANTUS) injection 1-100 Units  1-100 Units SubCUTAneous QHS    insulin lispro (HUMALOG) injection 1-100 Units  1-100 Units SubCUTAneous AC&HS       Objective:     Patient Vitals for the past 24 hrs:   BP Temp Pulse Resp SpO2   01/26/21 1328 -- 98.1 ??F (36.7 ??C) -- -- --   01/26/21 1200 (!) 169/115 98.1 ??F (36.7 ??C) 80 20 97 %   01/26/21 1115 -- 98.1 ??F (36.7 ??C) -- -- --   01/26/21 1100 (!) 137/97 -- 76 19 97 %   01/26/21 1004 -- -- -- -- 99 %   01/26/21 1000 -- -- 84 26 96 %   01/26/21 0958 -- 98 ??F (36.7 ??C) -- -- --   01/26/21 0809 -- 98.1 ??F (36.7 ??C) -- -- --   01/26/21 0801 -- -- -- -- 94 %  01/26/21 0800 (!) 159/76 -- 86 13 97 %   01/26/21 0641 -- 97.5 ??F (36.4 ??C) -- -- --   01/26/21 0600 (!) 159/109 97.5 ??F (36.4 ??C) 80 17 96 %   01/26/21 0400 (!) 141/95 -- 80 14 95 %   01/26/21 0207 (!) 134/95 -- 79 19 98 %   01/26/21 0200 (!) 134/115 -- 81 21 98 %   01/26/21 0033 -- -- -- -- 97 %   01/26/21 0015 -- 98.4 ??F (36.9 ??C) -- -- --   01/26/21 0000 124/87 98.4 ??F (36.9 ??C) 73 17 98 %   01/25/21 2200 134/86 -- 90 19 96 %   01/25/21 2100 -- -- -- -- 100 %   01/25/21 2003 -- -- -- -- 98 %   01/25/21 2000 (!) 152/97 -- 94 16 97 %   01/25/21 1800 (!) 146/102 -- 90 19 97 %   01/25/21 1638 -- -- -- -- 96 %   01/25/21 1616 -- 98.6 ??F (37 ??C) -- -- --   01/25/21 1600 (!) 139/94 98.6 ??F (37 ??C) 91 27 95 %      Temp (24hrs), Avg:98.1 ??F (36.7 ??C), Min:97.5 ??F (36.4 ??C), Max:98.6 ??F (37 ??C)    Admit weight:Weight: 103.1 kg (227 lb 4.8 oz)  Last recorded weight: Weight: 103.1 kg (227 lb 4.8 oz) BMI: Body mass index is 35.6 kg/m??.     Intake/Output Summary (Last 24 hours) at 01/26/2021 1541  Last data filed at 01/25/2021 1848  Gross per 24 hour   Intake 11.33 ml   Output --   Net 11.33 ml       Tele:(Personally reviewed by me):  Sinus rhythm      PHYSICAL EXAM:    Neuro: grossly nonfocal, alert and oriented x3  Pulmonary:equal chest excursion, clear to auscultation  Cardiac: no palpable thrill, S1 and S2 are normal, normal rate, regular rhythm, no murmur  Abdomen: soft, nontender, + bowel sounds  Extremities: no edema, no acrocyanosis  Skin: warm, no erythema,   Vascular: equal and adequate pulses    Labs:    GFR: Estimated Creatinine Clearance: 89.8 mL/min (based on SCr of 0.74 mg/dL).     Basic Metabolic Profile   Lab Results   Component Value Date/Time    NA 140 01/25/2021 04:07 AM    NA 140 01/24/2021 08:20 AM    NA 139 01/07/2021 09:10 AM    K 3.8 01/25/2021 04:07 AM    K 3.3 (L) 01/24/2021 08:20 AM    K 3.8 01/07/2021 09:10 AM    CL 96 (L) 01/25/2021 04:07 AM    CL 98 01/24/2021 08:20 AM    CL 96 (L) 01/07/2021 09:10 AM    CO2 40.0 (H) 01/25/2021 03:24 PM    CO2 34 (H) 01/25/2021 04:07 AM    CO2 44.0 (H) 01/24/2021 02:47 PM    CO2 43.0 (H) 01/24/2021 09:07 AM    CO2 36 (H) 01/24/2021 08:20 AM    CO2 >40 (H) 01/07/2021 09:10 AM    BUN 15 01/25/2021 04:07 AM    BUN 10 01/24/2021 08:20 AM    BUN 12 01/07/2021 09:10 AM    CREA 0.74 01/25/2021 04:07 AM    CREA 0.75 01/24/2021 08:20 AM    CREA 0.62 01/07/2021 09:10 AM    GLU 213 (H) 01/25/2021 04:07 AM    GLU 198 (H) 01/24/2021 08:20 AM    GLU 76 01/07/2021 09:10 AM    CA 9.4 01/25/2021 04:07  AM    CA 9.3 01/24/2021 08:20 AM    CA 9.8 01/07/2021 09:10 AM    MG 2.1 06/05/2019 07:19 AM    MG 2.4 08/29/2018 06:23 AM    MG 2.3 11/17/2017 04:14 PM    PHOS 3.7 09/06/2018 06:23 AM    PHOS 4.1 09/05/2018 06:12 AM    PHOS 3.7 09/04/2018 06:08 AM        CBC w/Diff    Lab Results   Component Value Date/Time    WBC 12.3 (H) 01/25/2021 04:07 AM    WBC 12.3 (H) 01/24/2021 08:20 AM    WBC 11.1 (H) 01/07/2021 09:10 AM    RBC 4.66 01/25/2021 04:07 AM    RBC 4.52 01/24/2021 08:20 AM    RBC 4.21 01/07/2021 09:10 AM    HGB 13.0 01/25/2021 04:07 AM    HGB 12.8 (L) 01/24/2021 08:20 AM    HGB 11.8 (L) 01/07/2021  09:10 AM    HCT 41.8 01/25/2021 04:07 AM    HCT 40.9 01/24/2021 08:20 AM    HCT 39.0 01/07/2021 09:10 AM    MCV 89.7 01/25/2021 04:07 AM    MCV 90.5 01/24/2021 08:20 AM    MCV 92.6 01/07/2021 09:10 AM    MCH 27.9 01/25/2021 04:07 AM    MCH 28.3 01/24/2021 08:20 AM    MCH 28.0 01/07/2021 09:10 AM    MCHC 31.1 01/25/2021 04:07 AM    MCHC 31.3 01/24/2021 08:20 AM    MCHC 30.3 01/07/2021 09:10 AM    RDW 14.6 (H) 09/21/2017 12:00 PM    RDW 12.7 01/31/2015 04:30 AM    RDW 12.9 01/30/2015 03:30 AM    PLT 232 01/25/2021 04:07 AM    PLT 196 01/24/2021 08:20 AM    PLT 159 01/07/2021 09:10 AM    Lab Results   Component Value Date/Time    ANEU 11.5 (H) 09/21/2017 12:00 PM    ANEU 5.9 01/31/2015 04:30 AM    ANEU 14.2 (H) 01/30/2015 03:30 AM    ABL 3.4 09/21/2017 12:00 PM    ABL 2.0 01/31/2015 04:30 AM    ABL 2.9 01/30/2015 03:30 AM    ABM 1.1 09/21/2017 12:00 PM    ABM 1.1 01/31/2015 04:30 AM    ABM 1.7 (H) 01/30/2015 03:30 AM    ABE 0.1 09/21/2017 12:00 PM    ABE 0.1 01/31/2015 04:30 AM    ABE 0.1 01/30/2015 03:30 AM    ABB 0.0 09/21/2017 12:00 PM    ABB 0.0 01/31/2015 04:30 AM    ABB 0.0 01/30/2015 03:30 AM    GRANS 93.5 (H) 01/25/2021 04:07 AM    GRANS 80.8 (H) 01/24/2021 08:20 AM    GRANS 77.0 (H) 01/07/2021 09:10 AM    LYMPH 5.1 (L) 01/25/2021 04:07 AM    LYMPH 13.1 (L) 01/24/2021 08:20 AM    LYMPH 15.7 (L) 01/07/2021 09:10 AM    MONOS 0.7 (L) 01/25/2021 04:07 AM    MONOS 4.3 01/24/2021 08:20 AM    MONOS 5.7 01/07/2021 09:10 AM    EOS 0.0 01/25/2021 04:07 AM    EOS 0.8 01/24/2021 08:20 AM    EOS 1.0 01/07/2021 09:10 AM    BASOS 0.1 01/25/2021 04:07 AM    BASOS 0.3 01/24/2021 08:20 AM    BASOS 0.3 01/07/2021 09:10 AM        Cardiac Enzymes   Recent Labs     01/24/21  0820 01/24/21  1025 01/24/21  1215   TROPONIN-HIGH SENSITIVITY <3 <3 4  Coagulation   Lab Results   Component Value Date/Time    PTP 11.0 05/26/2020 08:18 PM    PTP 11.7 08/28/2018 01:20 PM    PTP 13.2 (H) 09/08/2017 09:30 AM    INR 0.9 05/26/2020 08:18 PM     INR 1.0 08/28/2018 01:20 PM    INR 1.1 09/08/2017 09:30 AM    APTT 30.9 01/16/2015 12:34 PM    APTT 34.7 08/15/2014 06:29 AM          Burgess Estelle, MD  January 26, 2021, 3:41 PM

## 2021-01-26 NOTE — Progress Notes (Signed)
Formatting of this note is different from the original.  CARDIOLOGY DAILY PROGRESS NOTE    Patient: [name] [name] MRN: [id number]  SSN: [id number]    Date of Birth: Oct 17, 1950  Age: 70 y.o.  Sex: female      Admit Date: 01/24/2021    LOS: 2 days     Cardiologist:  PCP: [name], PA-C     Impression:   Chronic obstructive pulmonary disease with exacerbation left upper extremity DVT   Hypertensive urgency  Type 2 diabetes  Chronic heart failure with preserved systolic function      Plan:     Hypertension  Improved.  Continue current therapy.  Could increase amlodipine to 10 mg if further antihypertensive therapy is needed.    No evidence of heart failure.    COPD exacerbation  Improving from respiratory standpoint.  Management per pulmonary.    Left upper extremity DVT  Tolerating DOAC for anticoagulation.    Please call with any additional questions or concerns.  Cardiology will sign off for now.    Reason for visit:     Follow-up visit for uncontrolled hypertension  Interval History:     Intermittently on BiPAP currently transition to nasal cannula at 2 L  Blood pressure has improved  Now off nitroglycerin drip    ROS:     Gen: negative for fever or chill  Pulmonary: negative for dyspnea or cough  Cardiac: negative for chest pain, negative for palpitations    Current Facility-Administered Medications   Medication Dose Route Frequency    losartan (COZAAR) tablet 100 mg  100 mg Oral DAILY    senna-docusate (PERICOLACE) 8.6-50 mg per tablet 2 Tablet  2 Tablet Oral QHS    polyethylene glycol (MIRALAX) packet 17 g  17 g Oral DAILY    QUEtiapine (SEROquel) tablet 100 mg  100 mg Oral QHS    amLODIPine (NORVASC) tablet 5 mg  5 mg Oral DAILY    methylPREDNISolone (PF) (SOLU-MEDROL) injection 40 mg  40 mg IntraVENous Q12H    cloNIDine HCL (CATAPRES) tablet 0.2 mg  0.2 mg Oral TID    tiotropium bromide (SPIRIVA RESPIMAT) 2.5 mcg /actuation  2 Puff Inhalation DAILY    allopurinoL (ZYLOPRIM) tablet 100 mg  100 mg Oral BID     montelukast (SINGULAIR) tablet 10 mg  10 mg Oral DAILY    rosuvastatin (CRESTOR) tablet 5 mg  5 mg Oral QHS    apixaban (ELIQUIS) tablet 10 mg  10 mg Oral BID    [START ON 01/31/2021] apixaban (ELIQUIS) tablet 5 mg  5 mg Oral BID    insulin glargine (LANTUS) injection 1-100 Units  1-100 Units SubCUTAneous QHS    insulin lispro (HUMALOG) injection 1-100 Units  1-100 Units SubCUTAneous AC&HS     Objective:     Patient Vitals for the past 24 hrs:   BP Temp Pulse Resp SpO2   01/26/21 1328 -- 98.1 F (36.7 C) -- -- --   01/26/21 1200 (!) 169/115 98.1 F (36.7 C) 80 20 97 %   01/26/21 1115 -- 98.1 F (36.7 C) -- -- --   01/26/21 1100 (!) 137/97 -- 76 19 97 %   01/26/21 1004 -- -- -- -- 99 %   01/26/21 1000 -- -- 84 26 96 %   01/26/21 0958 -- 98 F (36.7 C) -- -- --   01/26/21 0809 -- 98.1 F (36.7 C) -- -- --   01/26/21 0801 -- -- -- -- 94 %  01/26/21 0800 (!) 159/76 -- 86 13 97 %   01/26/21 0641 -- 97.5 F (36.4 C) -- -- --   01/26/21 0600 (!) 159/109 97.5 F (36.4 C) 80 17 96 %   01/26/21 0400 (!) 141/95 -- 80 14 95 %   01/26/21 0207 (!) 134/95 -- 79 19 98 %   01/26/21 0200 (!) 134/115 -- 81 21 98 %   01/26/21 0033 -- -- -- -- 97 %   01/26/21 0015 -- 98.4 F (36.9 C) -- -- --   01/26/21 0000 124/87 98.4 F (36.9 C) 73 17 98 %   01/25/21 2200 134/86 -- 90 19 96 %   01/25/21 2100 -- -- -- -- 100 %   01/25/21 2003 -- -- -- -- 98 %   01/25/21 2000 (!) 152/97 -- 94 16 97 %   01/25/21 1800 (!) 146/102 -- 90 19 97 %   01/25/21 1638 -- -- -- -- 96 %   01/25/21 1616 -- 98.6 F (37 C) -- -- --   01/25/21 1600 (!) 139/94 98.6 F (37 C) 91 27 95 %     Temp (24hrs), Avg:98.1 F (36.7 C), Min:97.5 F (36.4 C), Max:98.6 F (37 C)    Admit weight:Weight: 103.1 kg (227 lb 4.8 oz)  Last recorded weight: Weight: 103.1 kg (227 lb 4.8 oz) BMI: Body mass index is 35.6 kg/m.     Intake/Output Summary (Last 24 hours) at 01/26/2021 1541  Last data filed at 01/25/2021 1848  Gross per 24 hour   Intake 11.33 ml   Output --   Net 11.33  ml     Tele:(Personally reviewed by me): Sinus rhythm    PHYSICAL EXAM:    Neuro: grossly nonfocal, alert and oriented x3  Pulmonary:equal chest excursion, clear to auscultation  Cardiac: no palpable thrill, S1 and S2 are normal, normal rate, regular rhythm, no murmur  Abdomen: soft, nontender, + bowel sounds  Extremities: no edema, no acrocyanosis  Skin: warm, no erythema,   Vascular: equal and adequate pulses    Labs:    GFR: Estimated Creatinine Clearance: 89.8 mL/min (based on SCr of 0.74 mg/dL).     Basic Metabolic Profile   Lab Results   Component Value Date/Time    NA 140 01/25/2021 04:07 AM    NA 140 01/24/2021 08:20 AM    NA 139 01/07/2021 09:10 AM    K 3.8 01/25/2021 04:07 AM    K 3.3 (L) 01/24/2021 08:20 AM    K 3.8 01/07/2021 09:10 AM    CL 96 (L) 01/25/2021 04:07 AM    CL 98 01/24/2021 08:20 AM    CL 96 (L) 01/07/2021 09:10 AM    CO2 40.0 (H) 01/25/2021 03:24 PM    CO2 34 (H) 01/25/2021 04:07 AM    CO2 44.0 (H) 01/24/2021 02:47 PM    CO2 43.0 (H) 01/24/2021 09:07 AM    CO2 36 (H) 01/24/2021 08:20 AM    CO2 >40 (H) 01/07/2021 09:10 AM    BUN 15 01/25/2021 04:07 AM    BUN 10 01/24/2021 08:20 AM    BUN 12 01/07/2021 09:10 AM    CREA 0.74 01/25/2021 04:07 AM    CREA 0.75 01/24/2021 08:20 AM    CREA 0.62 01/07/2021 09:10 AM    GLU 213 (H) 01/25/2021 04:07 AM    GLU 198 (H) 01/24/2021 08:20 AM    GLU 76 01/07/2021 09:10 AM    CA 9.4 01/25/2021 04:07 AM    CA  9.3 01/24/2021 08:20 AM    CA 9.8 01/07/2021 09:10 AM    MG 2.1 06/05/2019 07:19 AM    MG 2.4 08/29/2018 06:23 AM    MG 2.3 11/17/2017 04:14 PM    PHOS 3.7 09/06/2018 06:23 AM    PHOS 4.1 09/05/2018 06:12 AM    PHOS 3.7 09/04/2018 06:08 AM       CBC w/Diff    Lab Results   Component Value Date/Time    WBC 12.3 (H) 01/25/2021 04:07 AM    WBC 12.3 (H) 01/24/2021 08:20 AM    WBC 11.1 (H) 01/07/2021 09:10 AM    RBC 4.66 01/25/2021 04:07 AM    RBC 4.52 01/24/2021 08:20 AM    RBC 4.21 01/07/2021 09:10 AM    HGB 13.0 01/25/2021 04:07 AM    HGB 12.8 (L)  01/24/2021 08:20 AM    HGB 11.8 (L) 01/07/2021 09:10 AM    HCT 41.8 01/25/2021 04:07 AM    HCT 40.9 01/24/2021 08:20 AM    HCT 39.0 01/07/2021 09:10 AM    MCV 89.7 01/25/2021 04:07 AM    MCV 90.5 01/24/2021 08:20 AM    MCV 92.6 01/07/2021 09:10 AM    MCH 27.9 01/25/2021 04:07 AM    MCH 28.3 01/24/2021 08:20 AM    MCH 28.0 01/07/2021 09:10 AM    MCHC 31.1 01/25/2021 04:07 AM    MCHC 31.3 01/24/2021 08:20 AM    MCHC 30.3 01/07/2021 09:10 AM    RDW 14.6 (H) 09/21/2017 12:00 PM    RDW 12.7 01/31/2015 04:30 AM    RDW 12.9 01/30/2015 03:30 AM    PLT 232 01/25/2021 04:07 AM    PLT 196 01/24/2021 08:20 AM    PLT 159 01/07/2021 09:10 AM    Lab Results   Component Value Date/Time    ANEU 11.5 (H) 09/21/2017 12:00 PM    ANEU 5.9 01/31/2015 04:30 AM    ANEU 14.2 (H) 01/30/2015 03:30 AM    ABL 3.4 09/21/2017 12:00 PM    ABL 2.0 01/31/2015 04:30 AM    ABL 2.9 01/30/2015 03:30 AM    ABM 1.1 09/21/2017 12:00 PM    ABM 1.1 01/31/2015 04:30 AM    ABM 1.7 (H) 01/30/2015 03:30 AM    ABE 0.1 09/21/2017 12:00 PM    ABE 0.1 01/31/2015 04:30 AM    ABE 0.1 01/30/2015 03:30 AM    ABB 0.0 09/21/2017 12:00 PM    ABB 0.0 01/31/2015 04:30 AM    ABB 0.0 01/30/2015 03:30 AM    GRANS 93.5 (H) 01/25/2021 04:07 AM    GRANS 80.8 (H) 01/24/2021 08:20 AM    GRANS 77.0 (H) 01/07/2021 09:10 AM    LYMPH 5.1 (L) 01/25/2021 04:07 AM    LYMPH 13.1 (L) 01/24/2021 08:20 AM    LYMPH 15.7 (L) 01/07/2021 09:10 AM    MONOS 0.7 (L) 01/25/2021 04:07 AM    MONOS 4.3 01/24/2021 08:20 AM    MONOS 5.7 01/07/2021 09:10 AM    EOS 0.0 01/25/2021 04:07 AM    EOS 0.8 01/24/2021 08:20 AM    EOS 1.0 01/07/2021 09:10 AM    BASOS 0.1 01/25/2021 04:07 AM    BASOS 0.3 01/24/2021 08:20 AM    BASOS 0.3 01/07/2021 09:10 AM       Cardiac Enzymes   Recent Labs     01/24/21  0820 01/24/21  1025 01/24/21  1215   TROPONIN-HIGH SENSITIVITY <3 <3 4       Coagulation  Lab Results   Component Value Date/Time    PTP 11.0 05/26/2020 08:18 PM    PTP 11.7 08/28/2018 01:20 PM    PTP 13.2 (H)  09/08/2017 09:30 AM    INR 0.9 05/26/2020 08:18 PM    INR 1.0 08/28/2018 01:20 PM    INR 1.1 09/08/2017 09:30 AM    APTT 30.9 01/16/2015 12:34 PM    APTT 34.7 08/15/2014 06:29 AM       Burgess Estelleobert D McCray, MD  January 26, 2021, 3:41 PM    Electronically signed by Burgess EstelleMcCray, Robert D, MD at 01/26/2021  5:30 PM EDT

## 2021-01-26 NOTE — Progress Notes (Signed)
PHYSICAL THERAPY         Patient: [name] [name] (70 y.o. female)  Room: 3213/3213    Date: 01/26/2021  Time:  11:34  Primary Diagnosis: Acute CHF (congestive heart failure) (HCC) [I50.9]    Orders reviewed, chart reviewed.   Communicated with patient's nurse, respiratory therapist.    OBJECTIVE:     Physical therapy was attempted, however  held d/t increased respiratory support at this time . Per RN & RT patient has been placed on BiPAP after being on 2L NC earlier this AM.    PT will continue to monitor patient's status and follow up as schedule/patient status allow.    PLAN:     Our services will follow up as patient's condition and/or schedule permits.    Jacquenette Shone, PT, DPT

## 2021-01-26 NOTE — Progress Notes (Signed)
Progress Notes by Verlan Friends, MD at 01/26/21 [phone]                Author: Verlan Friends, MD  Service: Pulmonary Disease  Author Type: Physician       Filed: 01/26/21 1808  Date of Service: 01/26/21 0949  Status: Signed          Editor: Verlan Friends, MD (Physician)                       [facility] PULMONARY AND CRITICAL CARE MEDICINE            Name:  [name] [name]  MRN:  [id number]          DOB:  04/19/1951  Hospital:  [facility]          Date:  01/26/2021               IMPRESSION:     ??    Worsening shortness of breath, shortness of breath, and wheezing due to COPD with exacerbation   ??  ABG demonstrated primary metabolic alkalosis, and secondary respiratory acidosis   ??  Asthma/COPD with exacerbation   ??  Venous duplex of upper extremity obtained on 01/24/2021 demonstrated acute deep venous thrombosis of left upper extremity with occlusive thrombus in subclavian vein   ??  GERD   ??  PTSD              PLAN:     ??    Continue BiPAP on regular basis during nighttime, and as needed basis during daytime.  Titrate supplemental oxygen through nasal cannula,  and target oxygen saturation greater than 88%   ??  Decrease Solu-Medrol 40 mg daily .  If remains stable switch to p.o. prednisone   ??  Discontinue hydrochlorothiazide from Hyzaar.  Continue losartan   ??  Continue Spiriva 2.5 mcg 2 inhalations a day, and  Breo 100/25 mcg 1 inhalation  a day (patient is on Advair 100/50 twice a day).  Continue DuoNeb through nebulizer every 6 hours.   ??  Avoid excessive diuresis in light of ABG demonstrating metabolic alkalosis, this may deteriorate PCO2 retention   ??  DVT prophylaxis: On Eliquis for acute DVT          Subjective/Interval History:              Review of Systems    Unable to perform ROS: Other          Objective:     Vital Signs:       Visit Vitals   BP  (!) 159/76      Pulse  86      Temp  98.1 ??F (36.7 ??C)      Resp  13      Ht  5\' 7"  (1.702 m)      Wt  103.1 kg (227 lb 4.8  oz)      SpO2  94%      BMI  35.60 kg/m??                O2 Device: Nasal cannula     O2 Flow Rate (L/min): 2 l/min       Temp (24hrs), Avg:98.3 ??F (36.8 ??C), Min:97.5 ??F (36.4 ??C), Max:98.7 ??F (37.1 ??C)           Intake/Output:    Last shift:      No intake/output data recorded.   Last 3  shifts: 08/14 1901 - 08/16 0700   In: 43.3 [I.V.:43.3]   Out: 350 [Urine:350]      Intake/Output Summary (Last 24 hours) at 01/26/2021 0949   Last data filed at 01/25/2021 1848     Gross per 24 hour        Intake  11.33 ml        Output  --        Net  11.33 ml            Physical Exam   Vitals and nursing note reviewed.     HENT:       Mouth/Throat:       Mouth: Mucous membranes are moist.    Eyes:       Pupils: Pupils are equal, round, and reactive to light.     Cardiovascular:       Rate and Rhythm: Regular rhythm.    Pulmonary:       Breath sounds: No wheezing.     Abdominal:       General: Bowel sounds are normal.       Palpations: Abdomen is soft.     Musculoskeletal:          General: No swelling.    Skin:      General: Skin is warm and dry.    Neurological:       General: No focal deficit present.       Mental Status: She is alert and oriented to person, place, and time.               DATA:   Labs:     Recent Labs            01/25/21   0407  01/24/21   0820     WBC  12.3*  12.3*     HGB  13.0  12.8*     HCT  41.8  40.9         PLT  232  196          Recent Labs               01/25/21   1524  01/25/21   0407  01/24/21   1447  01/24/21   0907  01/24/21   0820     NA   --   140   --    --   140     K   --   3.8   --    --   3.3*     CL   --   96*   --    --   98     CO2  40.0*  34*  44.0*    < >  36*     GLU   --   213*   --    --   198*     BUN   --   15   --    --   10     CREA   --   0.74   --    --   0.75     CA   --   9.4   --    --   9.3     ALB   --    --    --    --   3.8     ALT   --    --    --    --   <  7*        < > = values in this interval not displayed.          Recent Labs             01/25/21   1524  01/24/21    1447  01/24/21   0907     PH  7.420  7.405  7.375     PCO2  58.9*  66.3*  69.3*     PO2  82.0  86.0  61.0*     HCO3  38.2*  41.5*  40.5*          FIO2  35  35   --              All Micro Results              None                      Imaging:   [] I  have personally reviewed the patients radiographs   [] Radiographs  reviewed with radiologist    []  No change from prior, tubes and lines in adequate position   []  Improved   []  Worsening            , MD

## 2021-01-27 LAB — POCT GLUCOSE
POC Glucose: 116 mg/dL — ABNORMAL HIGH (ref 65–105)
POC Glucose: 145 mg/dL — ABNORMAL HIGH (ref 65–105)
POC Glucose: 191 mg/dL — ABNORMAL HIGH (ref 65–105)
POC Glucose: 298 mg/dL — ABNORMAL HIGH (ref 65–105)

## 2021-01-27 LAB — BASIC METABOLIC PANEL
Anion Gap: 5 mmol/L (ref 5–15)
BUN: 19 mg/dl (ref 9–23)
CO2: 35 mEq/L — ABNORMAL HIGH (ref 20–31)
Calcium: 9.5 mg/dl (ref 8.7–10.4)
Chloride: 96 mEq/L — ABNORMAL LOW (ref 98–107)
Creatinine: 0.69 mg/dl (ref 0.55–1.02)
EGFR IF NonAfrican American: >60
GFR African American: >60
Glucose: 169 mg/dl — ABNORMAL HIGH (ref 74–106)
Potassium: 4.4 mEq/L (ref 3.5–5.1)
Sodium: 136 mEq/L (ref 136–145)

## 2021-01-27 LAB — GLUCOSE, POC
Glucose (POC): 116 mg/dL — ABNORMAL HIGH (ref 65–105)
Glucose (POC): 145 mg/dL — ABNORMAL HIGH (ref 65–105)
Glucose (POC): 191 mg/dL — ABNORMAL HIGH (ref 65–105)
Glucose (POC): 298 mg/dL — ABNORMAL HIGH (ref 65–105)

## 2021-01-27 LAB — METABOLIC PANEL, BASIC
Anion gap: 5 mmol/L (ref 5–15)
BUN: 19 mg/dl (ref 9–23)
CO2: 35 mEq/L — ABNORMAL HIGH (ref 20–31)
Calcium: 9.5 mg/dl (ref 8.7–10.4)
Chloride: 96 mEq/L — ABNORMAL LOW (ref 98–107)
Creatinine: 0.69 mg/dl (ref 0.55–1.02)
GFR est AA: >60
GFR est non-AA: >60
Glucose: 169 mg/dl — ABNORMAL HIGH (ref 74–106)
Potassium: 4.4 mEq/L (ref 3.5–5.1)
Sodium: 136 mEq/L (ref 136–145)

## 2021-01-27 MED ORDER — PREDNISONE 20 MG TAB
20 mg | Freq: Every day | ORAL | Status: DC
Start: 2021-01-27 — End: 2021-01-28
  Administered 2021-01-27 – 2021-01-28 (×2): via ORAL

## 2021-01-27 MED FILL — LOSARTAN 50 MG TAB: 50 mg | ORAL | Qty: 2

## 2021-01-27 MED FILL — ADMELOG U-100 INSULIN LISPRO 100 UNIT/ML SUBCUTANEOUS SOLUTION: 100 unit/mL | SUBCUTANEOUS | Qty: 1

## 2021-01-27 MED FILL — ONDANSETRON (PF) 4 MG/2 ML INJECTION: 4 mg/2 mL | INTRAMUSCULAR | Qty: 2

## 2021-01-27 MED FILL — SENOKOT-S 8.6 MG-50 MG TABLET: ORAL | Qty: 2

## 2021-01-27 MED FILL — ELIQUIS 5 MG TABLET: 5 mg | ORAL | Qty: 2

## 2021-01-27 MED FILL — TYLENOL 325 MG TABLET: 325 mg | ORAL | Qty: 2

## 2021-01-27 MED FILL — QUETIAPINE 100 MG TAB: 100 mg | ORAL | Qty: 1

## 2021-01-27 MED FILL — PREDNISONE 20 MG TAB: 20 mg | ORAL | Qty: 2

## 2021-01-27 MED FILL — ALLOPURINOL 100 MG TAB: 100 mg | ORAL | Qty: 1

## 2021-01-27 MED FILL — ROSUVASTATIN 5 MG TAB: 5 mg | ORAL | Qty: 1

## 2021-01-27 MED FILL — MONTELUKAST 10 MG TAB: 10 mg | ORAL | Qty: 1

## 2021-01-27 MED FILL — SOLU-MEDROL (PF) 40 MG/ML SOLUTION FOR INJECTION: 40 mg/mL | INTRAMUSCULAR | Qty: 1

## 2021-01-27 MED FILL — CLONIDINE 0.2 MG TAB: 0.2 mg | ORAL | Qty: 1

## 2021-01-27 MED FILL — POLYETHYLENE GLYCOL 3350 17 GRAM (100 %) ORAL POWDER PACKET: 17 gram | ORAL | Qty: 1

## 2021-01-27 MED FILL — SPIRONOLACTONE 25 MG TAB: 25 mg | ORAL | Qty: 1

## 2021-01-27 MED FILL — AMLODIPINE 5 MG TAB: 5 mg | ORAL | Qty: 1

## 2021-01-27 NOTE — Progress Notes (Signed)
Formatting of this note might be different from the original.    Problem: Falls - Risk of  Goal: *Absence of Falls  Description: Document [name] Fall Risk and appropriate interventions in the flowsheet.  Outcome: Progressing Towards Goal  Note: Fall Risk Interventions:  Mobility Interventions: Bed/chair exit alarm, Patient to call before getting OOB, Strengthening exercises (ROM-active/passive), Utilize walker, cane, or other assistive device        Medication Interventions: Bed/chair exit alarm, Patient to call before getting OOB, Teach patient to arise slowly    Elimination Interventions: Bed/chair exit alarm, Call light in reach, Patient to call for help with toileting needs, Toileting schedule/hourly rounds, Toilet paper/wipes in reach          Problem: Patient Education: Go to Patient Education Activity  Goal: Patient/Family Education  Outcome: Progressing Towards Goal    Problem: Pressure Injury - Risk of  Goal: *Prevention of pressure injury  Description: Document Braden Scale and appropriate interventions in the flowsheet.  Outcome: Progressing Towards Goal  Note: Pressure Injury Interventions:  Sensory Interventions: Assess changes in LOC, Chair cushion, Discuss PT/OT consult with provider, Keep linens dry and wrinkle-free, Minimize linen layers, Pressure redistribution bed/mattress (bed type)    Moisture Interventions: Absorbent underpads, Internal/External urinary devices, Maintain skin hydration (lotion/cream), Minimize layers, Offer toileting Q_hr    Activity Interventions: Assess need for specialty bed, PT/OT evaluation, Increase time out of bed, Pressure redistribution bed/mattress(bed type)    Mobility Interventions: Assess need for specialty bed, Pressure redistribution bed/mattress (bed type), PT/OT evaluation    Nutrition Interventions: Document food/fluid/supplement intake    Friction and Shear Interventions: Apply protective barrier, creams and emollients, Lift team/patient mobility team,  Minimize layers, Transferring/repositioning devices          Problem: Patient Education: Go to Patient Education Activity  Goal: Patient/Family Education  Outcome: Progressing Towards Goal    Electronically signed by Laurence Slate., LPN at 50/23/2897 12:52 PM EDT

## 2021-01-27 NOTE — Progress Notes (Signed)
 Progress Notes by Lavella Nat BROCKS, PT at 01/27/21 1003                Author: Lavella Nat BROCKS, PT  Service: Physical Therapy  Author Type: Physical Therapist       Filed: 01/27/21 1428  Date of Service: 01/27/21 1003  Status: Signed          Editor: Lavella Nat BROCKS, PT (Physical Therapist)                  PHYSICAL THERAPY EVALUATION:                 Patient: [name] [name] (70 y.o. female)   Room: 3213/3213   [x]   Patient DOB Verified      Date of Admission: 01/24/2021    Length of Stay:  3 day(s)   Primary Diagnosis: Acute CHF (congestive heart failure) (HCC) [I50.9]         Insurance: Payor: [employer] MEDICARE / Plan: [facility] [employer] MEDICARE HMO / Product Type: Managed Care Medicare /        Date: 01/27/2021   In time: 08:16  Out time:  09:17         Precautions: Falls.           Isolation:   There are currently no Active Isolations       MDRO: No active infections        Equipment: rolling walker, gait belt, bedside commode         H&P:      Per chart review: Patient is a 70 y.o. female with PMHx including COPD, chronic pain, HTN, diabetes, and PTSD who presented to [facility] on 8/14   with interval increase in shortness of breath from baseline for past 1 week. She also reported left arm pain and swelling at the time of presentation.  Subsequent work-up including venous duplex of left upper extremity demonstrated occlusive subclavian  vein DVT. Interestingly ABG demonstrated metabolic alkalosis with secondary respiratory acidosis. She was admitted to SDU for close monitoring and medical management.        Assessment:         Based on the objective data described below, the patient presents with   - ICU Mobility Level:  5 - Transferring bed to chair   - decreased mobility affecting function   - decreased independence with functional mobility   - decreased tolerance to sustained activity   - active participation in mobility assessment, mobility training, therapeutic exercises, therapeutic activities, balance  training, and transfer training.    - active participation in gait training: 2x4 feet with gait belt/rolling walker, min assist x 1   - patient declined further ambulation despite encouragement d/t complaints of fatigue and generalized weakness   - active participation in transfer training, bed to bedside commode and bedside commode to bedside chair with min A x 1 and 2nd person present for safety considerations   -  pain well managed, primary complaint is abdominal discomfort d/t constipation     - motivated to participate in PT to improve functional activity tolerance and return to prior level of function      Functional Status Score for the Intensive Care Unit (FSS-ICU)      Task   Score     1.  Rolling  5 - Requires cues or coaxing, but can perform physically without assistance     2. Supine to Sit Transfer  5 - Requires cues or coaxing, but  can physically perform without assistance     3. Sit to Stand Transfer  4 - Min assist (patient performs 75% or more of the work)     4. Sitting Edge of Bed  7 - Independent, maintains hands free     5. Walking  1 - Walks <50 feet with the assistance of 1 person OR requires the assistance of 2 people to assist with ambulation of any distance     TOTAL SCORE:  22/35        INITIAL TOTAL SCORE:                Patients rehabilitation potential for below stated goals: Good.        Recommendations:   Recommend continued physical therapy during acute stay. Occupational Therapy.  Recommend out of bed activity to counteract ill effects of bedrest, with assistance from staff as needed.   Discharge Recommendations: Patient does not want to go to rehab facility, Home with family support and HHPT, Patient will continue to benefit from  skilled PT services (HHPT) to increase overall strength, activity tolerance, standing balance and tolerance, functional mobility, safety awareness, and overall self care independence. Recommend overall need for supervision when returning home for  optimized  safety, 24/7 supervision to assist as needed and for safety.   Further Equipment Recommendations for Discharge: bedside commode.          Plan:         Patient will benefit from skilled Physical Therapy intervention to address the above impairments to return to prior level of function. Patient will be seen 3-5 times/week for at least 1 week.      Patient will achieve PT goals in 1 weeks. Goals were created with input from the patient.        Physical Therapy Goals:        - Patient will be modified independent with bed mobility and will be modified independent with transfers in preparation for OOB activities and ambulation.   - Patient will tolerate sitting up in chair for at least 1 meal/day.   - Patient will ambulate with modified independence for 15 feet with the least restrictive device to promote functional independence at home.    - Patient will ascend/descend 3 stairs with handrail(s) with minimal assistance/contact guard assist to enter home and/or go to upstairs bedroom/bathroom.   - Patient will demonstrate good balance to safely enable upright activities and reduce risk for falls.   - Patient will demonstrate good activity tolerance during functional activities.   - Patient will demonstrate good safety awareness during functional activities.   - Patient will improve FSS-ICU score to at least 26/35 in order to indicate improved functional independence.   - Patient will be independent with lower extremity home exercise program to increase strength and endurance.   - Patient will utilize energy conservation techniques during functional activities.        Planned interventions:         Skilled Physical Therapy services will provide functional mobility training, therapeutic exercises, therapeutic activities, patient/caregiver education as indicated.   Skilled Physical Therapy services will modify and progress therapeutic interventions, address functional mobility deficits, address ROM and strength  deficits, analyze and cue movement  patterns and assess and modify postural abnormalities to reach the stated goals.        Subjective:         Patient agreeable to PT/OT co-evaluation to maximize safety and mobility. PT emphasis on balance and  mobility and OT focus on ADLs. Patient pleasant and cooperative with therapy as she wishes to progress mobility in order to return home. Oh I could walk  right out of here Patient stated after declining ambulation to the bedside chair across the room as she felt too weak.    Patient reports moderate abdominal pain throughout session related to constipation.         Objective Data Summary:         Orders, labs, and chart reviewed on [facility]. Communicated with patient's nurse (patient ok to be seen by PT).       Present illness history:      Patient Active Problem List           Diagnosis  Date Noted         ?  Acute CHF (congestive heart failure) (HCC)  01/24/2021     ?  UTI (urinary tract infection)  06/01/2019     ?  COPD exacerbation (HCC)  05/07/2019     ?  Acute hypoxemic respiratory failure (HCC)  05/07/2019     ?  Chronic respiratory failure with hypoxia (HCC)  08/22/2018     ?  Headache  08/05/2018     ?  Abdominal pain  02/04/2018     ?  SIRS (systemic inflammatory response syndrome) (HCC)  10/09/2017     ?  Hyperkalemia  09/08/2017     ?  Obtunded  09/08/2017     ?  Acute renal failure (ARF) (HCC)  09/08/2017     ?  Septic shock (HCC)  09/08/2017     ?  Metabolic encephalopathy  09/08/2017     ?  Dehydration  08/07/2017     ?  COPD with acute exacerbation (HCC)  08/07/2017     ?  Acute-on-chronic kidney injury (HCC)  08/07/2017     ?  Chest pain  06/15/2017     ?  Dyspnea  06/15/2017     ?  Type 2 diabetes mellitus with diabetic neuropathy (HCC)  12/07/2016     ?  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  Cannabinoid hyperemesis syndrome  08/17/2016     ?  Gastritis  08/16/2016     ?  Nausea & vomiting  08/16/2016     ?  Asthma with acute exacerbation   08/04/2016     ?  Lactic acidosis  07/24/2016     ?  Leukocytosis  07/24/2016     ?  Sepsis (HCC)  07/24/2016     ?  Asthma exacerbation  07/24/2016     ?  Type 2 diabetes mellitus with nephropathy (HCC)  06/12/2016     ?  Sacroiliitis (HCC)  11/25/2015     ?  Spondylosis of lumbar region without myelopathy or radiculopathy  09/15/2015     ?  Lumbar and sacral osteoarthritis  09/15/2015     ?  Chronic pain syndrome  09/15/2015     ?  Uncontrolled type 2 diabetes mellitus with hyperglycemia (HCC)  08/20/2015     ?  Acute colitis  07/02/2015     ?  Acute hyperglycemia  07/02/2015     ?  Accelerated hypertension  04/08/2015     ?  Severe headache  04/07/2015     ?  Osteoarthritis of hips, bilateral  01/29/2015     ?  PTSD (post-traumatic stress disorder)  01/29/2015         ?  Severe hypertension  07/21/2014         Previous medical history:      Past Medical History:        Diagnosis  Date         ?  Arthritis       ?  Chronic pain syndrome            related to R hip replacement         ?  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  DM2 (diabetes mellitus, type 2) (HCC)       ?  GERD (gastroesophageal reflux disease)       ?  Hepatitis C            HEP C         ?  HTN (hypertension)       ?  Lung nodule, multiple            (CT 10/2016) Small left upper lobe and 1.7 x 1.6 cm left lower lobe nodules not significantly changed from 07/23/2016 and 03/22/2016         ?  Marijuana use       ?  PTSD (post-traumatic stress disorder)            lived through the [place]         ?  Thoracic ascending aortic aneurysm (HCC)            4.4cm noted on CT Chest (10/2016)         ?  Thyroid nodule            2.5 cm stable right thyroid nodule (CT 10/2016)            Prior Level of Function/Home Situation:    Information was obtained by patient   Home environment:  Patient lives with spouse in a 2 story home. Bedroom is on the second floor. 3 steps  to enter. (+) rails. (+) chair lift to  2nd floor bedroom.    Prior level of function: sedentary lifestyle, uses walker intermittently for ambulation, needs assistance with ADLs   Home equipment: rolling walker, stair lift         Patient found:        Bed, (+) bed/chair exit alarm, (+) oxygen, (+) IV, (+) ICU/step down equipment   Most recent value for oxygen in flow sheets:   O2 Device: Nasal cannula (01/26/21 2017)   O2 Flow Rate (L/min): 2 l/min (01/26/21 2017)      Patient received/participated in 45 minutes of treatment (functional mobility training, transfer training, education for positioning, gait training, balance training, therapeutic exercises, therapeutic  activities, AD training, patient education) during/immediately following PT evaluation.        Cognitive Status:        Mental Status: Alert and oriented x 4   Communication: speech and language intact, patient interacts appropriately   Follows commands: interacts appropriately   General Cognition: mood and affect normal, appropriate to situation.    Safety/Judgement: needs cueing for safety and precautions        Extremities Assessment:         Sensation:   Intact to light touch BLE      Strength:     WFL, grossly 4/5 BLE  Range Of Motion:   [facility] BLE        Therapeutic Activities; Functional Mobility and Balance Status:         Focus on functional mobility training, bed mobility training, transfer training, education for positioning, gait training, balance training, therapeutic exercises, therapeutic activities, AD training, patient education. Provided cueing for hand placement,  technique, and safety.          Bed Mobility:      Functional Status     Comments         Supine to sit  contact guard/min assist/1 person assist, verbal cues, set-up, requires additional time, use of bed rails       Sit to supine  not tested, transferred to bedside chair           Rolling  supervision, verbal cues, requires additional time, use of bed rails               Transfers:      Functional Status     Comments         Sit to stand  min assist/1 person assist, 2nd person assist for safety considerations, verbal cues, tactile cues, set-up, requires additional time, assistive device:rolling  walker, gait belt  - performed x 5   - performed from bed level, bedside commode, and bedside chair     Stand to sit  min assist/1 person assist, 2nd person assist for safety considerations, verbal cues, tactile cues, set-up, requires additional time, assistive device:rolling  walker, gait belt           bed to bed side commode, commode to chair  min/mod assist/1 person assist, 2nd person assist for safety considerations, verbal cues, tactile cues, set-up, requires additional time, assistive device:rolling  walker, gait belt              Ambulation/Gait Training:           Distance   2x4 feet      Analysis   unsteady, step to gait pattern, shuffled gait, guarded, downward gaze, impaired balance, decreased endurance, rest breaks needed, motivated to increase distance,  distance limited by generalized fatigue and weakness         Assistance   min/mod assist/1 person assist, 2nd person assist for safety considerations, verbal cues, tactile cues, requires additional timeand cueing for posture, sequencing,  activity pacing, foot placement, technique, and safety         DME   gait belt           Stair Training   not tested, unable            Functional Balance               Static sitting    Good static: patient able to maintain balance  without handhold support, limited postural sway      Dynamic sitting    Good dynamic: patient accepts moderate challenge      Static standing    fair (accepts min challenge), needs UE support         Dynamic standing    fair (accepts min challenge), needs support, needs UE support          Therapeutic Exercises:            Therapeutic Exercises   performed a few reps of each, AROM BLE, needs verbal cues to stay on task:    ankle pumps, heel slides in supine, hip  ab/duction in supine, long  arc quads (LAQ), straight leg raise, seated marches, standing marches           Balance   Activities/   Neuromuscular Re-Education   Worked to improve reactive postural responses: stable surface,  standing, sitting on commode, sitting edge of bed, weight shift, bilaterally, reaching outside of base of support, reaching high/low, emphasis on sitting and standing balance in order to actively participate in functional/self-care tasks.     Required supervision-min A for balance support/correction and verbal/visual cues for safety and technique.           Activity Tolerance:        - requires increased time, rest breaks and repeated attempts with functional tasks   - motivated to increase activity   - decreased tolerance to sustained activity   - generalized fatigue   - generalized muscle weakness   - activity tolerance limited by deconditioning   - requires oxygen   - vitals stable   - no apparent distress        Final Location:        Seated in bed side chair, all needs within reach. Patient agrees to call for assistance. Positioned with pillows for comfort. BLE elevated. Patient set up with tray. Patient eating. (+) O2. (+) nurse  notified. Patient watching tv. Patient is glad to be OOB.        Communication/Education:        Education: benefits of activity, OOB with assist from staff, OOB activity as tolerated, OOB to chair for meals and mobilize as tolerated, activity  as tolerated to counteract ill effects of bedrest, promote healing and return to prior level of function, activity pacing, energy conservation, call staff for assistance, safety, breathing exercises, positioning, DME, home safety: good lighting and remove  throw rugs, clutter for safe mobility at home, disposition, role of PT, PT plan of care, all questions answered.      Education provided to: patient    Opportunity for questions and clarification was provided.      Readiness to learn indicated by: verbalized understanding,  trying to perform skills, asking questions, and showing interest      Barriers to learning/limitations:  None      Comprehension: Patient communicated comprehension and In agreement         Thank you for this referral.   Nat JAYSON Friedman, PT, DPT

## 2021-01-27 NOTE — Progress Notes (Signed)
Formatting of this note might be different from the original.  Report received from Mallory, LPN. Report included the following information SBAR, Kardex, MAR, and Cardiac Rhythm            Electronically signed by Lemar Lofty, RN at 01/27/2021  6:40 PM EDT

## 2021-01-27 NOTE — Progress Notes (Signed)
Progress Notes by Verlan Friends, MD at 01/27/21 [phone]                Author: Verlan Friends, MD  Service: Pulmonary Disease  Author Type: Physician       Filed: 01/27/21 1608  Date of Service: 01/27/21 0946  Status: Signed          Editor: Verlan Friends, MD (Physician)                       [facility] PULMON[facility]ITICAL CARE MEDICINE            Name:  [name] [name]  MRN:  [id number]          DOB:  October 09, 1950  Hospital:  [facility]          Date:  01/27/2021               IMPRESSION:     ??    Worsening shortness of breath, shortness of breath, and wheezing due to COPD with exacerbation   ??  ABG demonstrated primary metabolic alkalosis, and secondary respiratory acidosis   ??  Asthma/COPD with exacerbation   ??  Venous duplex of upper extremity obtained on 01/24/2021 demonstrated acute deep venous thrombosis of left upper extremity with occlusive thrombus in subclavian vein   ??  GERD   ??  PTSD              PLAN:     ??    Continue BiPAP on regular basis during nighttime, and as needed basis during daytime.  Titrate supplemental oxygen through nasal cannula,  and target oxygen saturation greater than 88%   ??  Switch to p.o. prednisone   ??  Continue Spiriva 2.5 mcg 2 inhalations a day, and  Breo 100/25 mcg 1 inhalation  a day (patient is on Advair 100/50 twice a day).  Continue DuoNeb through nebulizer every 6 hours.   ??  Avoid excessive diuresis in light of ABG demonstrating metabolic alkalosis, this may deteriorate PCO2 retention   ??  DVT prophylaxis: On Eliquis for acute DVT          Subjective/Interval History:              Review of Systems    Unable to perform ROS: Other          Objective:     Vital Signs:       Visit Vitals   BP  (!) 142/108      Pulse  91      Temp  97.8 ??F (36.6 ??C)      Resp  21      Ht  5\' 7"  (1.702 m)      Wt  103.1 kg (227 lb 4.8 oz)      SpO2  97%      BMI  35.60 kg/m??                O2 Device: Nasal cannula     O2 Flow Rate (L/min): 2 l/min        Temp (24hrs), Avg:97.7 ??F (36.5 ??C), Min:96.8 ??F (36 ??C), Max:98.1 ??F (36.7 ??C)           Intake/Output:    Last shift:      No intake/output data recorded.   Last 3 shifts: 08/15 1901 - 08/17 0700   In: 480 [P.O.:480]   Out: 700 [Urine:700]  Intake/Output Summary (Last 24 hours) at 01/27/2021 0946   Last data filed at 01/27/2021 0546     Gross per 24 hour        Intake  480 ml        Output  700 ml        Net  -220 ml               Physical Exam   Vitals and nursing note reviewed.     HENT:       Mouth/Throat:       Mouth: Mucous membranes are moist.    Eyes:       Pupils: Pupils are equal, round, and reactive to light.     Cardiovascular:       Rate and Rhythm: Regular rhythm.    Pulmonary:       Breath sounds: No wheezing.     Abdominal:       General: Bowel sounds are normal.       Palpations: Abdomen is soft.     Musculoskeletal:          General: No swelling.    Skin:      General: Skin is warm and dry.    Neurological:       General: No focal deficit present.       Mental Status: She is alert and oriented to person, place, and time.               DATA:   Labs:     Recent Labs           01/25/21   0407     WBC  12.3*     HGB  13.0     HCT  41.8        PLT  232             Recent Labs             01/27/21   0537  01/25/21   1524  01/25/21   0407     NA  136   --   140     K  4.4   --   3.8     CL  96*   --   96*     CO2  35*  40.0*  34*     GLU  169*   --   213*     BUN  19   --   15     CREA  0.69   --   0.74          CA  9.5   --   9.4             Recent Labs            01/25/21   1524  01/24/21   1447     PH  7.420  7.405     PCO2  58.9*  66.3*     PO2  82.0  86.0     HCO3  38.2*  41.5*         FIO2  35  35                All Micro Results              None                      Imaging:   [] I  have personally reviewed the patients radiographs   [] Radiographs  reviewed with radiologist    []  No change from prior, tubes and lines in adequate position   []  Improved   []  Worsening            ,  MD

## 2021-01-27 NOTE — Progress Notes (Signed)
Formatting of this note is different from the original.  Progress Note    Patient: [name] [name]   Age:  70 y.o.  DOA: 01/24/2021   Admit Dx / CC:   LOS:  LOS: 3 days     Active Problems   1.  Acute on chronic hypoxic and hypercapnic respiratory failure  2.  COPD with acute exacerbation  3.  Acute DVT L arm  4.  Hypertensive urgency  5.  DM type II controlled  6.  Hypokalemia  7.  CHF chronic diastolic  8.  Obesity with possible OSA  9.  Metabolic alkalosis  10.  Constipation  Plan:   1.  Patient was seen and examined this morning.  Patient was feeling little better she was able to get off from the BiPAP.  We will continue supplemental oxygen which will be titrated to keep oxygen saturation more than 89%.  Patient was advised incentive spirometry.  PT/OT consult done patient was encouraged to participate  2.  Patient responded well to IV steroids.  Will discontinue IV Solu-Medrol patient be started on prednisone 40 mg daily which will be slowly tapered off  3.  Patient has significant metabolic alkalosis.  Hydrochlorothiazide was discontinued.  Patient was started on spironolactone 25 mg daily.  Basic metabolic profile will be repeated in a.m. after discontinuing hydrochlorothiazide and adding spironolactone metabolic acidosis has started improving  4.  Patient had left arm Doppler done which showed acute DVT.  Patient was started on Eliquis 10 mg twice daily for a week followed by 5 mg twice daily  5.  For constipation patient was started on Senokot-S 2 tablets at night and MiraLAX 17 g daily.  Patient was also advised to drink prune juice.  Constipation is finally improved patient had 2 bowel movements  6.  Patient is hemodynamically stable to be transferred to floor  If patient remained stable keeps improving possible discharge home with home health and PT OT in a.m.  Subjective:   Feeling little better, shortness of breath started improving  Had a good bowel movement  Objective:   Visit Vitals  BP (!) 134/95    Pulse 77   Temp 98.1 F (36.7 C)   Resp 27   Ht 5\' 7"  (1.702 m)   Wt 103.1 kg (227 lb 4.8 oz)   SpO2 95%   BMI 35.60 kg/m     Physical Exam:  General appearance: Very pleasant elderly female well-built   Sitting comfortably no acute distress  Lungs: Good air entry on both sides  Heart: Regular rhythm, no murmur  Abdomen: soft, non-tender. Bowel sounds Audible,  Extremities: Trace edema  Neurologic: Awake and alert  Answering questions appropriately  Intake and Output:  Current Shift:  08/17 0701 - 08/17 1900  In: -   Out: 1   Last three shifts:  08/15 1901 - 08/17 0700  In: 480 [P.O.:480]  Out: 700 [Urine:700]    Lab/Data Reviewed:  Recent Results (from the past 48 hour(s))   GLUCOSE, POC    Collection Time: 01/25/21  4:28 PM   Result Value Ref Range    Glucose (POC) 244 (H) 65 - 105 mg/dL   GLUCOSE, POC    Collection Time: 01/25/21  9:10 PM   Result Value Ref Range    Glucose (POC) 161 (H) 65 - 105 mg/dL   GLUCOSE, POC    Collection Time: 01/26/21  7:27 AM   Result Value Ref Range    Glucose (POC)  179 (H) 65 - 105 mg/dL   GLUCOSE, POC    Collection Time: 01/26/21 11:52 AM   Result Value Ref Range    Glucose (POC) 163 (H) 65 - 105 mg/dL   GLUCOSE, POC    Collection Time: 01/26/21  5:03 PM   Result Value Ref Range    Glucose (POC) 247 (H) 65 - 105 mg/dL   GLUCOSE, POC    Collection Time: 01/26/21  9:35 PM   Result Value Ref Range    Glucose (POC) 116 (H) 65 - 105 mg/dL   METABOLIC PANEL, BASIC    Collection Time: 01/27/21  5:37 AM   Result Value Ref Range    Potassium 4.4 3.5 - 5.1 mEq/L    Chloride 96 (L) 98 - 107 mEq/L    Sodium 136 136 - 145 mEq/L    CO2 35 (H) 20 - 31 mEq/L    Glucose 169 (H) 74 - 106 mg/dl    BUN 19 9 - 23 mg/dl    Creatinine 3.01 6.01 - 1.02 mg/dl    GFR est AA >09.3      GFR est non-AA >60      Calcium 9.5 8.7 - 10.4 mg/dl    Anion gap 5 5 - 15 mmol/L   GLUCOSE, POC    Collection Time: 01/27/21  8:20 AM   Result Value Ref Range    Glucose (POC) 145 (H) 65 - 105 mg/dL   GLUCOSE, POC     Collection Time: 01/27/21 11:41 AM   Result Value Ref Range    Glucose (POC) 191 (H) 65 - 105 mg/dL     Imaging:  No results found.    Medications Reviewed:  Current Facility-Administered Medications   Medication Dose Route Frequency    predniSONE (DELTASONE) tablet 40 mg  40 mg Oral DAILY WITH BREAKFAST    losartan (COZAAR) tablet 100 mg  100 mg Oral DAILY    senna-docusate (PERICOLACE) 8.6-50 mg per tablet 2 Tablet  2 Tablet Oral QHS    polyethylene glycol (MIRALAX) packet 17 g  17 g Oral DAILY    QUEtiapine (SEROquel) tablet 100 mg  100 mg Oral QHS    spironolactone (ALDACTONE) tablet 25 mg  25 mg Oral DAILY    amLODIPine (NORVASC) tablet 5 mg  5 mg Oral DAILY    cloNIDine HCL (CATAPRES) tablet 0.2 mg  0.2 mg Oral TID    tiotropium bromide (SPIRIVA RESPIMAT) 2.5 mcg /actuation  2 Puff Inhalation DAILY    allopurinoL (ZYLOPRIM) tablet 100 mg  100 mg Oral BID    montelukast (SINGULAIR) tablet 10 mg  10 mg Oral DAILY    rosuvastatin (CRESTOR) tablet 5 mg  5 mg Oral QHS    naloxone (NARCAN) injection 0.1 mg  0.1 mg IntraVENous PRN    acetaminophen (TYLENOL) tablet 650 mg  650 mg Oral Q4H PRN    Or    acetaminophen (TYLENOL) solution 650 mg  650 mg Oral Q4H PRN    Or    acetaminophen (TYLENOL) suppository 650 mg  650 mg Rectal Q4H PRN    ondansetron (ZOFRAN) injection 4 mg  4 mg IntraVENous Q4H PRN    apixaban (ELIQUIS) tablet 10 mg  10 mg Oral BID    [START ON 01/31/2021] apixaban (ELIQUIS) tablet 5 mg  5 mg Oral BID    dextrose (D50W) injection syrg 5-25 g  10-50 mL IntraVENous PRN    glucagon (GLUCAGEN) injection 1 mg  1 mg IntraMUSCular PRN  insulin glargine (LANTUS) injection 1-100 Units  1-100 Units SubCUTAneous QHS    insulin lispro (HUMALOG) injection 1-100 Units  1-100 Units SubCUTAneous AC&HS    insulin lispro (HUMALOG) injection 1-100 Units  1-100 Units SubCUTAneous PRN     Dragon medical dictation software was used for portions of this report. Unintended errors may occur.   Hazel Samsahir Ahmed, MD  P#  [phone]  January 27, 2021   Electronically signed by Hazel SamsAhmed, Tahir, MD at 01/27/2021  4:22 PM EDT

## 2021-01-27 NOTE — Progress Notes (Signed)
Formatting of this note is different from the original.  Images from the original note were not included.      [facility] [facility]     Name: [name] [name] MRN: [id number]   DOB: 01-21-1951 Hospital: [facility]   Date: 01/27/2021        IMPRESSION:   Worsening shortness of breath, shortness of breath, and wheezing due to COPD with exacerbation  ABG demonstrated primary metabolic alkalosis, and secondary respiratory acidosis  Asthma/COPD with exacerbation  Venous duplex of upper extremity obtained on 01/24/2021 demonstrated acute deep venous thrombosis of left upper extremity with occlusive thrombus in subclavian vein  GERD  PTSD     PLAN:   Continue BiPAP on regular basis during nighttime, and as needed basis during daytime.  Titrate supplemental oxygen through nasal cannula, and target oxygen saturation greater than 88%  Switch to p.o. prednisone  Continue Spiriva 2.5 mcg 2 inhalations a day, and  Breo 100/25 mcg 1 inhalation  a day (patient is on Advair 100/50 twice a day).  Continue DuoNeb through nebulizer every 6 hours.  Avoid excessive diuresis in light of ABG demonstrating metabolic alkalosis, this may deteriorate PCO2 retention  DVT prophylaxis: On Eliquis for acute DVT     Subjective/Interval History:     Review of Systems   Unable to perform ROS: Other     Objective:   Vital Signs:    Visit Vitals  BP (!) 142/108   Pulse 91   Temp 97.8 F (36.6 C)   Resp 21   Ht 5\' 7"  (1.702 m)   Wt 103.1 kg (227 lb 4.8 oz)   SpO2 97%   BMI 35.60 kg/m     O2 Device: Nasal cannula   O2 Flow Rate (L/min): 2 l/min   Temp (24hrs), Avg:97.7 F (36.5 C), Min:96.8 F (36 C), Max:98.1 F (36.7 C)      Intake/Output:   Last shift:      No intake/output data recorded.  Last 3 shifts: 08/15 1901 - 08/17 0700  In: 480 [P.O.:480]  Out: 700 [Urine:700]    Intake/Output Summary (Last 24 hours) at 01/27/2021 0946  Last data filed at 01/27/2021 0546  Gross per 24 hour   Intake 480 ml   Output  700 ml   Net -220 ml         Physical Exam  Vitals and nursing note reviewed.   HENT:      Mouth/Throat:      Mouth: Mucous membranes are moist.   Eyes:      Pupils: Pupils are equal, round, and reactive to light.   Cardiovascular:      Rate and Rhythm: Regular rhythm.   Pulmonary:      Breath sounds: No wheezing.   Abdominal:      General: Bowel sounds are normal.      Palpations: Abdomen is soft.   Musculoskeletal:         General: No swelling.   Skin:     General: Skin is warm and dry.   Neurological:      General: No focal deficit present.      Mental Status: She is alert and oriented to person, place, and time.       DATA:  Labs:  Recent Labs     01/25/21  0407   WBC 12.3*   HGB 13.0   HCT 41.8   PLT 232     Recent Labs  01/27/21  0537 01/25/21  1524 01/25/21  0407   NA 136  --  140   K 4.4  --  3.8   CL 96*  --  96*   CO2 35* 40.0* 34*   GLU 169*  --  213*   BUN 19  --  15   CREA 0.69  --  0.74   CA 9.5  --  9.4     Recent Labs     01/25/21  1524 01/24/21  1447   PH 7.420 7.405   PCO2 58.9* 66.3*   PO2 82.0 86.0   HCO3 38.2* 41.5*   FIO2 35 35     All Micro Results       None           Imaging:  [] I have personally reviewed the patient?s radiographs  [] Radiographs reviewed with radiologist   [] No change from prior, tubes and lines in adequate position  [] Improved   [] Worsening    , MD    Electronically signed by , MD at 01/27/2021  4:08 PM EDT

## 2021-01-27 NOTE — Progress Notes (Signed)
 OCCUPATIONAL THERAPY EVALUATION     Patient: [name] [name] (70 y.o. female)  Room: 3213/3213  Primary Diagnosis: Acute CHF (congestive heart failure) (HCC) [I50.9]         Date: 01/27/2021  In time:  0817        Out time:  0917  Total Time: 60 minutes  Evaluation Time: 20 minutes  Treatment Time: 40 minutes    Isolation:  There are currently no Active Isolations       MDRO: No active infections  Precautions: Falls.  ADULT DIET Regular; Low Sodium (2 gm)  Weight bearing precautions: None    Orders, labs, and chart reviewed. Communicated with nursing staff. Patient cleared to participate.    H&P:   Per PT note 01/27/2021: Per chart review: Patient is a 70 y.o. female with PMHx including COPD, chronic pain, HTN, diabetes, and PTSD who presented to [facility] on 8/14  with interval increase in shortness of breath from baseline for past 1 week. She also reported left arm pain and swelling at the time of presentation.  Subsequent work-up including venous duplex of left upper extremity demonstrated occlusive subclavian vein DVT. Interestingly ABG demonstrated metabolic alkalosis with secondary respiratory acidosis. She was admitted to SDU for close monitoring and medical management.    ASSESSMENT:      Patient was agreeable to occupational therapy evaluation this date. Patient seen with PT for increased patient/therapist safety with OT focus on ADL's and PT focus on mobility. Patient motivated to work with therapy to return to PLOF. OT participated in active listening throughout discussion during session, providing clarity for patient's questions as able.     Based on the objective data described below, the patient presents with   - generalized muscle weakness affecting function in ADLs  - decreased functional sitting and standing balance    - decreased functional mobility   - unsteady in functional mobility, transfers, standing, and sitting   - decreased standing tolerance  - decreased activity tolerance, increased fatigue  and/or shortness of breath  - decreased tolerance to sustained activity  - deconditioning  - increased pain in abdominal   - decreased safety awareness  - decreased flexibility  - decreased ability to do cross leg technique for lower body dressing affecting patient's ability to safely and independently perform basic ADLs/IADLs.    Patient will benefit from skilled occupational therapy intervention to address the above impairments.    Patient's rehabilitation potential is considered to be Good.     PLAN :  Planned Interventions: Adaptive equipment, ADI training, activity tolerance, functional balance training, functional mobility training, therapeutic exercise, therapeutic activity, patient/caregiver education and training, home exercise program, and energy conservation.  Frequency/Duration: Patient to be seen 2-5x/week x 2 weeks.  Discharge Recommendations:  SNF verses HH OT/PT , pending progress, Would benefit to improve independence in ADLs, strength, activity tolerance and balance to ensure a successful and sustainable return to home. , 24 hour supervision  Further Equipment Recommendations for Discharge:  grab bar around toilet , bedside commode     Patient and/or family have participated as able in goal setting and plan of care.    PRIOR LEVEL OF FUNCTION:     Information was obtained by:  patient, chart review  Home environment: Patient lives with spouse in a 2 story house with a bedroom on 2nd floor.   Patient's bathroom has tub/shower combo, standard toilet, shower chair, grab bars by toilet, and grab bars in shower.    Comment: (+)  stair lift and 2 STE home; bedroom on second floor      Prior level of function:Patient requires assistance with basic ADLs.  Patient ambulates with rolling walker.  Prior level of Instrumental Activities of Daily Living: Patient does not perform any IADLs.   Patient reports ability to drive however stated husband most often drives as it is rare that husband is not there .  Home  equipment: shower chair, grab bars, rolling walker, stair lift    EDUCATION:     Barriers to Learning/Limitations: yes;  emotional  Education provided: Patient, Role of OT, Benefit of activity while hospitalized, Call for assistance, Out of bed 2-3 times/day, Staff assistance with mobility, Changes positions frequently, Sit out of bed for 45-60 minutes or as tolerated, ADL training, Adaptive equipment use, Energy conservation, Safety, Functional mobility, Verbalized understanding, Reinforce needed, Teaching method, and Verbal.  Educational Handouts issued: NONE.    SUBJECTIVE:   Patient reported, I feel so weak when I stand.   --------------------------------------------------------------------------------------------------  Pain Assessment: 7/10  Pain Location:  Abdominal pain    OBJECTIVE DATA SUMMARY:     Patient found Bed, Telemetry, ICU/stepdown equip, Oxygen, Catheter, IV, and Bed alarm.    Cognition     Mental Status: Oriented to, person, place, month, year, situation, and pleasant.  Communication: grossly intact and soft spoken.  Attention span: good(>54min).  Follows commands: follows simple 1 step commands, requires redirection to task, able to redirect, tactile cues, verbal cues, visual cues.  General Cognition:  requires verbal cues for safety and precautions .  Hearing: grossly intact.  Vision:  glasses for reading    Activities of Daily Living    Eating: supervision, set-up, and increased time, chair level  Grooming: supervision, verbal cues, set-up, increased time, and chair level.  UB Bathing: standby assist, verbal cues, set-up, increased time, and chair level.  LB Bathing: max. assist, verbal cues, set-up, safety concerns, increased time, and chair level.  UB Dressing: min. assist, verbal cues, set-up, increased time, and chair level.  LB Dressing: max. assist, verbal cues, set-up, safety concerns, increased time, and chair level  Toileting: contact guard, verbal cues, set-up, increased time, and  BSC.    ADL Comment:  Based on clinical assessment, clinical judgement, and direct observation of patient this date. Supervision for grooming ADL's this date, washing face with wash cloth. Declined LB dressing this date due to increased fatigue and pain in abdomen. CGA for toileting this date with verbal cues for safety.     Mobility    Rolling: supervision.  Supine to sit: contact guard, min. assist, verbal cues, set-up, safety concerns, increased time, and rail.  Sit to Supine: not tested.  Sit to Stand: contact guard, min. assist, verbal cues, manual cues, set-up, safety concerns, increased time, assistive devices: gait belt and RW, and second person for safety considerations . Completed x5.   Functional transfers: bed to bedside commode to chair with  min. assist, mod. assist, verbal cues, manual cues, set-up, safety concerns, increased time, assistive device : gait belt and RW, and second person for safety considerations .    Mobility Comment: Completed sit <> stand x5 from various surfaces. Completed from bed level, BSC, and bedside chair. Required use of RW for increased BUE support. Educated patient on safe walker management, safe hand placement, and safety awareness for all transfers and functional mobility this date. Use of gait belt for increased safety with functional mobility.     Functional Balance    Static  Sitting Balance: fair.  Dynamic Sitting Balance: fair-.  Static Standing Balance: fair-.  Dynamic Standing Balance: fair-.  Activity Tolerance: requires oxygen, frequent rest breaks, complaint of fatigue after standing activity, and observed SOB with activity.    Balance Comment: Able to reach outside of BOS while seated. Required RW for increased BUE support and balance while in standing. Use of gait belt for increased safety with balance. Motivated to work with therapy to return to PLOF. Generalized fatigue and muscle weakness noted. Activity tolerance is highly limited by deconditioning and  fatigue. Vitals stable throughout session. No apparent distress. Currently on 2 L O2 via nasal cannula. O2 in 90's throughout session.     Upper Extremity Function    Right ROM/strength:  AROM, WFL, and 4+/5.  Left ROM/strength: AROM, WFL, and 4+/5  Right UE: no c/o paresthesia.  Left UE : no c/o paresthesia.  Dominance: right  Affected extremity: none    UE Function comments: WFL's for BUE's ROM and strength.     Final Location: bedside chair, all needs close, agrees to call for assistance, nurse notified , and oxygen, BLE's elevated.      Micky Index of Independence in Activities of Daily Living    Activities  Points (1 or 0) Independence  (1 point)  NO supervision, direction, or personal assistance Dependence   (0 points)  WITH supervision, direction, personal assistance, or total care   Bathing []  (1 POINT) Bathes self completely or needs help in bathing only a single part of the body such as the back, genital area, or disabled extremity [x]  (0 POINTS) Need help with bathing more than one part of the body, getting in or out of the tub or showering. Requires total bathing.    Dressing []  (1 POINT) Get clothes from  closets and drawers and puts on clothes and outer garments complete with fasteners. May have help tying shoes.  [x]  (0 POINTS) Needs help with dressing self or needs to be completely dressed.    Toileting []  (1 POINT) Goes to toileting, gets on and off, arranges clothes, cleans genital area without help  [x]  Needs help transferring to the toileting, cleaning self or uses bedpan or commode    Transferring []  (1 POINT) Moves in and out of the bed or chair unassisted. Mechanical transfer aids are acceptable  [x] (0 POINTS) Needs help in moving from bed to chair or requires a complete transfer   Continence  []  (1 POINT) Exercises complete self control over urination and defecation  [x]  (0 POINTS) Is partially or totally incontinent of bowel or bladder   Feeding  []  (1 POINT) Gets food from plate into mouth  without help. Preparation of food may be done by another person [x]  (0 POINTS) Needs partial or total help with feeding or requires parenteral feeding.     Total Score = 0             Scoring: 6 = High (patient independent) 0 = Low (patient very dependent)      By Hoy Shutter, PhD, APRN, BC, [facility], and Ronal Corns, PhD, ARNP, [place]  [facility]    Occupational Therapy Goals:   OT goals initiated 01/27/2021 and will be met by patient within 10 sessions     Patient will complete UB dressing/bathing with SBA to increase I in ADLs.  Patient will complete sit to stand transfer with supervision and DME/AE PRN to increase I in functional transfers.  Patient will complete  LB dressing/bathing with min A and DME/AE PRN to improve ADL function.  Patient will stand at sink x 8 minutes with fair balance to complete grooming tasks.  Patient will complete toileting tasks with supervision to improve ADL function.   _______________________________________________________________________    Thank you for this referral.    Lyle HERO Cardwell, OTD, OTR/L

## 2021-01-27 NOTE — Progress Notes (Signed)
Report received from [place], LPN. Report included the following information SBAR, Kardex, MAR, and Cardiac Rhythm

## 2021-01-27 NOTE — Progress Notes (Signed)
Formatting of this note is different from the original.  OCCUPATIONAL THERAPY EVALUATION     Patient: [name] [name] (70 y.o. female)  Room: 3213/3213  Primary Diagnosis: Acute CHF (congestive heart failure) ([place]) [I50.9]      Date: 01/27/2021  In time:  0817        Out time:  0917  Total Time: 60 minutes  Evaluation Time: 20 minutes  Treatment Time: 40 minutes    Isolation:  There are currently no Active Isolations       MDRO: No active infections  Precautions: Falls.  ADULT DIET Regular; Low Sodium (2 gm)  Weight bearing precautions: None    Orders, labs, and chart reviewed. Communicated with nursing staff. Patient cleared to participate.    H&P:   Per PT note 01/27/2021: "Per chart review: Patient is a 70 y.o. female with PMHx including COPD, chronic pain, HTN, diabetes, and PTSD who presented to [facility] on 8/14  with interval increase in shortness of breath from baseline for past 1 week. She also reported left arm pain and swelling at the time of presentation.  Subsequent work-up including venous duplex of left upper extremity demonstrated occlusive subclavian vein DVT. Interestingly ABG demonstrated metabolic alkalosis with secondary respiratory acidosis. She was admitted to SDU for close monitoring and medical management."    ASSESSMENT:      Patient was agreeable to occupational therapy evaluation this date. Patient seen with PT for increased patient/therapist safety with OT focus on ADL's and PT focus on mobility. Patient motivated to work with therapy to return to PLOF. OT participated in active listening throughout discussion during session, providing clarity for patient's questions as able.     Based on the objective data described below, the patient presents with   - generalized muscle weakness affecting function in ADLs  - decreased functional sitting and standing balance    - decreased functional mobility   - unsteady in functional mobility, transfers, standing, and sitting   - decreased standing  tolerance  - decreased activity tolerance, increased fatigue and/or shortness of breath  - decreased tolerance to sustained activity  - deconditioning  - increased pain in abdominal   - decreased safety awareness  - decreased flexibility  - decreased ability to do cross leg technique for lower body dressing affecting patient's ability to safely and independently perform basic ADLs/IADLs.    Patient will benefit from skilled occupational therapy intervention to address the above impairments.    Patient?s rehabilitation potential is considered to be Good.     PLAN :  Planned Interventions: Adaptive equipment, ADI training, activity tolerance, functional balance training, functional mobility training, therapeutic exercise, therapeutic activity, patient/caregiver education and training, home exercise program, and energy conservation.  Frequency/Duration: Patient to be seen 2-5x/week x 2 weeks.  Discharge Recommendations:  SNF verses HH OT/PT , pending progress, Would benefit to improve independence in ADLs, strength, activity tolerance and balance to ensure a successful and sustainable return to home. , 24 hour supervision  Further Equipment Recommendations for Discharge:  grab bar around toilet , bedside commode     Patient and/or family have participated as able in goal setting and plan of care.    PRIOR LEVEL OF FUNCTION:     Information was obtained by:  patient, chart review  Home environment: Patient lives with spouse in a 2 story house with a bedroom on 2nd floor.   Patient's bathroom has tub/shower combo, standard toilet, shower chair, grab bars by toilet, and grab bars  in shower.    Comment: (+) stair lift and 2 STE home; bedroom on second floor      Prior level of function:Patient requires assistance with basic ADLs.  Patient ambulates with rolling walker.  Prior level of Instrumental Activities of Daily Living: Patient does not perform any IADLs.   Patient reports ability to drive however stated husband most  often drives as it is rare that husband is not there .  Home equipment: shower chair, grab bars, rolling walker, stair lift    EDUCATION:     Barriers to Learning/Limitations: yes;  emotional  Education provided: Patient, Role of OT, Benefit of activity while hospitalized, Call for assistance, Out of bed 2-3 times/day, Staff assistance with mobility, Changes positions frequently, Sit out of bed for 45-60 minutes or as tolerated, ADL training, Adaptive equipment use, Energy conservation, Safety, Functional mobility, Verbalized understanding, Reinforce needed, Teaching method, and Verbal.  Educational Handouts issued: NONE.    SUBJECTIVE:   Patient reported, "I feel so weak when I stand."   --------------------------------------------------------------------------------------------------  Pain Assessment: 7/10  Pain Location:  Abdominal pain    OBJECTIVE DATA SUMMARY:     Patient found Bed, Telemetry, ICU/stepdown equip, Oxygen, Catheter, IV, and Bed alarm.    Cognition     Mental Status: Oriented to, person, place, month, year, situation, and pleasant.  Communication: grossly intact and soft spoken.  Attention span: good(>5mn).  Follows commands: follows simple 1 step commands, requires redirection to task, able to redirect, tactile cues, verbal cues, visual cues.  General Cognition:  requires verbal cues for safety and precautions .  Hearing: grossly intact.  Vision:  glasses for reading    Activities of Daily Living    Eating: supervision, set-up, and increased time, chair level  Grooming: supervision, verbal cues, set-up, increased time, and chair level.  UB Bathing: standby assist, verbal cues, set-up, increased time, and chair level.  LB Bathing: max. assist, verbal cues, set-up, safety concerns, increased time, and chair level.  UB Dressing: min. assist, verbal cues, set-up, increased time, and chair level.  LB Dressing: max. assist, verbal cues, set-up, safety concerns, increased time, and chair  level  Toileting: contact guard, verbal cues, set-up, increased time, and BSC.    ADL Comment:  Based on clinical assessment, clinical judgement, and direct observation of patient this date. Supervision for grooming ADL's this date, washing face with wash cloth. Declined LB dressing this date due to increased fatigue and pain in abdomen. CGA for toileting this date with verbal cues for safety.     Mobility    Rolling: supervision.  Supine to sit: contact guard, min. assist, verbal cues, set-up, safety concerns, increased time, and rail.  Sit to Supine: not tested.  Sit to Stand: contact guard, min. assist, verbal cues, manual cues, set-up, safety concerns, increased time, assistive devices: gait belt and RW, and second person for safety considerations . Completed x5.   Functional transfers: bed to bedside commode to chair with  min. assist, mod. assist, verbal cues, manual cues, set-up, safety concerns, increased time, assistive device : gait belt and RW, and second person for safety considerations .    Mobility Comment: Completed sit <> stand x5 from various surfaces. Completed from bed level, BSC, and bedside chair. Required use of RW for increased BUE support. Educated patient on safe walker management, safe hand placement, and safety awareness for all transfers and functional mobility this date. Use of gait belt for increased safety with functional mobility.  Functional Balance    Static Sitting Balance: fair.  Dynamic Sitting Balance: fair-.  Static Standing Balance: fair-.  Dynamic Standing Balance: fair-.  Activity Tolerance: requires oxygen, frequent rest breaks, complaint of fatigue after standing activity, and observed SOB with activity.    Balance Comment: Able to reach outside of BOS while seated. Required RW for increased BUE support and balance while in standing. Use of gait belt for increased safety with balance. Motivated to work with therapy to return to PLOF. Generalized fatigue and muscle  weakness noted. Activity tolerance is highly limited by deconditioning and fatigue. Vitals stable throughout session. No apparent distress. Currently on 2 L O2 via nasal cannula. O2 in 90's throughout session.     Upper Extremity Function    Right ROM/strength:  AROM, WFL, and 4+/5.  Left ROM/strength: AROM, WFL, and 4+/5  Right UE: no c/o paresthesia.  Left UE : no c/o paresthesia.  Dominance: right  Affected extremity: none    UE Function comments: WFL's for BUE's ROM and strength.     Final Location: bedside chair, all needs close, agrees to call for assistance, nurse notified , and oxygen, BLE's elevated.     Ron Parker Index of Independence in Activities of Daily Living    Activities  Points (1 or 0) Independence  (1 point)  NO supervision, direction, or personal assistance Dependence   (0 points)  WITH supervision, direction, personal assistance, or total care   Bathing '[]'  (1 POINT) Bathes self completely or needs help in bathing only a single part of the body such as the back, genital area, or disabled extremity '[x]'  (0 POINTS) Need help with bathing more than one part of the body, getting in or out of the tub or showering. Requires total bathing.    Dressing '[]'  (1 POINT) Get clothes from  closets and drawers and puts on clothes and outer garments complete with fasteners. May have help tying shoes.  '[x]'  (0 POINTS) Needs help with dressing self or needs to be completely dressed.    Toileting '[]'  (1 POINT) Goes to toileting, gets on and off, arranges clothes, cleans genital area without help  '[x]'  Needs help transferring to the toileting, cleaning self or uses bedpan or commode    Transferring '[]'  (1 POINT) Moves in and out of the bed or chair unassisted. Mechanical transfer aids are acceptable  '[x]' (0 POINTS) Needs help in moving from bed to chair or requires a complete transfer   Continence  '[]'  (1 POINT) Exercises complete self control over urination and defecation  '[x]'  (0 POINTS) Is partially or totally incontinent of  bowel or bladder   Feeding  '[]'  (1 POINT) Gets food from plate into mouth without help. Preparation of food may be done by another person '[x]'  (0 POINTS) Needs partial or total help with feeding or requires parenteral feeding.     Total Score = 0             Scoring: 6 = High (patient independent) 0 = Low (patient very dependent)      By Phylliss Blakes, PhD, APRN, BC, [facility], and Helene Shoe, PhD, [place], [facility]    Occupational Therapy Goals:   OT goals initiated 01/27/2021 and will be met by patient within 10 sessions     Patient will complete UB dressing/bathing with SBA to increase I in ADLs.  Patient will complete sit to stand transfer with supervision and DME/AE PRN to increase I in functional  transfers.  Patient will complete LB dressing/bathing with min A and DME/AE PRN to improve ADL function.  Patient will stand at sink x 8 minutes with fair balance to complete grooming tasks.  Patient will complete toileting tasks with supervision to improve ADL function.   _______________________________________________________________________    Thank you for this referral.    Marcha Dutton, OTD, OTR/L  Electronically signed by Marcha Dutton, OT at 01/27/2021  2:41 PM EDT

## 2021-01-27 NOTE — Unmapped (Signed)
Formatting of this note is different from the original.  TRANSFER - OUT REPORT:    Verbal report given to Shanda Bumps RN (name) on [name] [name]  being transferred to 5 W(unit) for routine progression of care       Report consisted of patient?s Situation, Background, Assessment and   Recommendations(SBAR).     Information from the following report(s) SBAR, Recent Results, and Cardiac Rhythm NSR W/PAC's  was reviewed with the receiving nurse.    Lines:   Peripheral IV 01/27/21 Left;Distal Cephalic (Active)       Opportunity for questions and clarification was provided.      Patient transported with:   Tech      Electronically signed by Laurence Slate., LPN at 42/11/6149  6:39 PM EDT

## 2021-01-27 NOTE — Progress Notes (Signed)
Problem: Falls - Risk of  Goal: *Absence of Falls  Description: Document [name] [name] Fall Risk and appropriate interventions in the flowsheet.  Outcome: Progressing Towards Goal  Note: Fall Risk Interventions:  Mobility Interventions: Bed/chair exit alarm, Patient to call before getting OOB, Strengthening exercises (ROM-active/passive), Utilize walker, cane, or other assistive device         Medication Interventions: Bed/chair exit alarm, Patient to call before getting OOB, Teach patient to arise slowly    Elimination Interventions: Bed/chair exit alarm, Call light in reach, Patient to call for help with toileting needs, Toileting schedule/hourly rounds, Toilet paper/wipes in reach              Problem: Patient Education: Go to Patient Education Activity  Goal: Patient/Family Education  Outcome: Progressing Towards Goal     Problem: Pressure Injury - Risk of  Goal: *Prevention of pressure injury  Description: Document Braden Scale and appropriate interventions in the flowsheet.  Outcome: Progressing Towards Goal  Note: Pressure Injury Interventions:  Sensory Interventions: Assess changes in LOC, Chair cushion, Discuss PT/OT consult with provider, Keep linens dry and wrinkle-free, Minimize linen layers, Pressure redistribution bed/mattress (bed type)    Moisture Interventions: Absorbent underpads, Internal/External urinary devices, Maintain skin hydration (lotion/cream), Minimize layers, Offer toileting Q_hr    Activity Interventions: Assess need for specialty bed, PT/OT evaluation, Increase time out of bed, Pressure redistribution bed/mattress(bed type)    Mobility Interventions: Assess need for specialty bed, Pressure redistribution bed/mattress (bed type), PT/OT evaluation    Nutrition Interventions: Document food/fluid/supplement intake    Friction and Shear Interventions: Apply protective barrier, creams and emollients, Lift team/patient mobility team, Minimize layers, Transferring/repositioning devices                 Problem: Patient Education: Go to Patient Education Activity  Goal: Patient/Family Education  Outcome: Progressing Towards Goal
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
## 2021-01-27 NOTE — Unmapped (Signed)
Formatting of this note might be different from the original.  Bedside shift change report given to Sherlyn Lees, RN (oncoming nurse) by Bary Richard, RN (offgoing nurse). Report included the following information SBAR, Kardex, and MAR.     Electronically signed by Sherlyn Lees, RN at 01/28/2021  1:10 AM EDT

## 2021-01-27 NOTE — Progress Notes (Signed)
Progress Note    Patient: [name] [name]   Age:  70 y.o.  DOA: 01/24/2021   Admit Dx / CC:   LOS:  LOS: 3 days     Active Problems   1.  Acute on chronic hypoxic and hypercapnic respiratory failure  2.  COPD with acute exacerbation  3.  Acute DVT L arm  4.  Hypertensive urgency  5.  DM type II controlled  6.  Hypokalemia  7.  CHF chronic diastolic  8.  Obesity with possible OSA  9.  Metabolic alkalosis  10.  Constipation  Plan:   1.  Patient was seen and examined this morning.  Patient was feeling little better she was able to get off from the BiPAP.  We will continue supplemental oxygen which will be titrated to keep oxygen saturation more than 89%.  Patient was advised incentive spirometry.  PT/OT consult done patient was encouraged to participate  2.  Patient responded well to IV steroids.  Will discontinue IV Solu-Medrol patient be started on prednisone 40 mg daily which will be slowly tapered off  3.  Patient has significant metabolic alkalosis.  Hydrochlorothiazide was discontinued.  Patient was started on spironolactone 25 mg daily.  Basic metabolic profile will be repeated in a.m. after discontinuing hydrochlorothiazide and adding spironolactone metabolic acidosis has started improving  4.  Patient had left arm Doppler done which showed acute DVT.  Patient was started on Eliquis 10 mg twice daily for a week followed by 5 mg twice daily  5.  For constipation patient was started on Senokot-S 2 tablets at night and MiraLAX 17 g daily.  Patient was also advised to drink prune juice.  Constipation is finally improved patient had 2 bowel movements  6.  Patient is hemodynamically stable to be transferred to floor  If patient remained stable keeps improving possible discharge home with home health and PT OT in a.m.  Subjective:   Feeling little better, shortness of breath started improving  Had a good bowel movement  Objective:   Visit Vitals  BP (!) 134/95   Pulse 77   Temp 98.1 ??F (36.7 ??C)   Resp 27   Ht 5\' 7"   (1.702 m)   Wt 103.1 kg (227 lb 4.8 oz)   SpO2 95%   BMI 35.60 kg/m??       Physical Exam:  General appearance: Very pleasant elderly female well-built   Sitting comfortably no acute distress  Lungs: Good air entry on both sides  Heart: Regular rhythm, no murmur  Abdomen: soft, non-tender. Bowel sounds Audible,  Extremities: Trace edema  Neurologic: Awake and alert  Answering questions appropriately  Intake and Output:  Current Shift:  08/17 0701 - 08/17 1900  In: -   Out: 1   Last three shifts:  08/15 1901 - 08/17 0700  In: 480 [P.O.:480]  Out: 700 [Urine:700]    Lab/Data Reviewed:  Recent Results (from the past 48 hour(s))   GLUCOSE, POC    Collection Time: 01/25/21  4:28 PM   Result Value Ref Range    Glucose (POC) 244 (H) 65 - 105 mg/dL   GLUCOSE, POC    Collection Time: 01/25/21  9:10 PM   Result Value Ref Range    Glucose (POC) 161 (H) 65 - 105 mg/dL   GLUCOSE, POC    Collection Time: 01/26/21  7:27 AM   Result Value Ref Range    Glucose (POC) 179 (H) 65 - 105 mg/dL  GLUCOSE, POC    Collection Time: 01/26/21 11:52 AM   Result Value Ref Range    Glucose (POC) 163 (H) 65 - 105 mg/dL   GLUCOSE, POC    Collection Time: 01/26/21  5:03 PM   Result Value Ref Range    Glucose (POC) 247 (H) 65 - 105 mg/dL   GLUCOSE, POC    Collection Time: 01/26/21  9:35 PM   Result Value Ref Range    Glucose (POC) 116 (H) 65 - 105 mg/dL   METABOLIC PANEL, BASIC    Collection Time: 01/27/21  5:37 AM   Result Value Ref Range    Potassium 4.4 3.5 - 5.1 mEq/L    Chloride 96 (L) 98 - 107 mEq/L    Sodium 136 136 - 145 mEq/L    CO2 35 (H) 20 - 31 mEq/L    Glucose 169 (H) 74 - 106 mg/dl    BUN 19 9 - 23 mg/dl    Creatinine 6.29 5.28 - 1.02 mg/dl    GFR est AA >41.3      GFR est non-AA >60      Calcium 9.5 8.7 - 10.4 mg/dl    Anion gap 5 5 - 15 mmol/L   GLUCOSE, POC    Collection Time: 01/27/21  8:20 AM   Result Value Ref Range    Glucose (POC) 145 (H) 65 - 105 mg/dL   GLUCOSE, POC    Collection Time: 01/27/21 11:41 AM   Result Value Ref Range     Glucose (POC) 191 (H) 65 - 105 mg/dL       Imaging:  No results found.    Medications Reviewed:  Current Facility-Administered Medications   Medication Dose Route Frequency    predniSONE (DELTASONE) tablet 40 mg  40 mg Oral DAILY WITH BREAKFAST    losartan (COZAAR) tablet 100 mg  100 mg Oral DAILY    senna-docusate (PERICOLACE) 8.6-50 mg per tablet 2 Tablet  2 Tablet Oral QHS    polyethylene glycol (MIRALAX) packet 17 g  17 g Oral DAILY    QUEtiapine (SEROquel) tablet 100 mg  100 mg Oral QHS    spironolactone (ALDACTONE) tablet 25 mg  25 mg Oral DAILY    amLODIPine (NORVASC) tablet 5 mg  5 mg Oral DAILY    cloNIDine HCL (CATAPRES) tablet 0.2 mg  0.2 mg Oral TID    tiotropium bromide (SPIRIVA RESPIMAT) 2.5 mcg /actuation  2 Puff Inhalation DAILY    allopurinoL (ZYLOPRIM) tablet 100 mg  100 mg Oral BID    montelukast (SINGULAIR) tablet 10 mg  10 mg Oral DAILY    rosuvastatin (CRESTOR) tablet 5 mg  5 mg Oral QHS    naloxone (NARCAN) injection 0.1 mg  0.1 mg IntraVENous PRN    acetaminophen (TYLENOL) tablet 650 mg  650 mg Oral Q4H PRN    Or    acetaminophen (TYLENOL) solution 650 mg  650 mg Oral Q4H PRN    Or    acetaminophen (TYLENOL) suppository 650 mg  650 mg Rectal Q4H PRN    ondansetron (ZOFRAN) injection 4 mg  4 mg IntraVENous Q4H PRN    apixaban (ELIQUIS) tablet 10 mg  10 mg Oral BID    [START ON 01/31/2021] apixaban (ELIQUIS) tablet 5 mg  5 mg Oral BID    dextrose (D50W) injection syrg 5-25 g  10-50 mL IntraVENous PRN    glucagon (GLUCAGEN) injection 1 mg  1 mg IntraMUSCular PRN    insulin glargine (LANTUS)  injection 1-100 Units  1-100 Units SubCUTAneous QHS    insulin lispro (HUMALOG) injection 1-100 Units  1-100 Units SubCUTAneous AC&HS    insulin lispro (HUMALOG) injection 1-100 Units  1-100 Units SubCUTAneous PRN         Dragon medical dictation software was used for portions of this report. Unintended errors may occur.   [name], MD  P# [phone]  January 27, 2021

## 2021-01-28 ENCOUNTER — Encounter: Payer: MEDICARE | Attending: Orthopaedic Surgery | Primary: Physician Assistant

## 2021-01-28 LAB — POCT GLUCOSE
POC Glucose: 171 mg/dL — ABNORMAL HIGH (ref 65–105)
POC Glucose: 154 mg/dL — ABNORMAL HIGH (ref 65–105)
POC Glucose: 215 mg/dL — ABNORMAL HIGH (ref 65–105)

## 2021-01-28 LAB — GLUCOSE, POC
Glucose (POC): 171 mg/dL — ABNORMAL HIGH (ref 65–105)
Glucose (POC): 154 mg/dL — ABNORMAL HIGH (ref 65–105)
Glucose (POC): 215 mg/dL — ABNORMAL HIGH (ref 65–105)

## 2021-01-28 MED ORDER — INSULIN GLARGINE 100 UNIT/ML (3 ML) SUB-Q PEN
100 unit/mL (3 mL) | PEN_INJECTOR | Freq: Every evening | SUBCUTANEOUS | 0 refills | Status: AC
Start: 2021-01-28 — End: ?

## 2021-01-28 MED ORDER — CLONIDINE 0.2 MG TAB
0.2 mg | ORAL_TABLET | Freq: Two times a day (BID) | ORAL | 0 refills | Status: DC
Start: 2021-01-28 — End: 2021-05-23

## 2021-01-28 MED ORDER — LOSARTAN 100 MG TAB
100 mg | ORAL_TABLET | Freq: Every day | ORAL | 0 refills | Status: AC
Start: 2021-01-28 — End: ?

## 2021-01-28 MED ORDER — INSULIN ASPART 100 UNIT/ML INJECTION
100 unit/mL | Freq: Three times a day (TID) | SUBCUTANEOUS | 0 refills | Status: AC
Start: 2021-01-28 — End: ?

## 2021-01-28 MED ORDER — ELIQUIS DVT-PE TREATMENT 30-DAY STARTER 5 MG (74 TABLETS) IN DOSE PACK
574 mg (74 tabs) | ORAL | 0 refills | Status: AC
Start: 2021-01-28 — End: 2021-05-23

## 2021-01-28 MED ORDER — SENNOSIDES-DOCUSATE SODIUM 8.6 MG-50 MG TAB
ORAL_TABLET | Freq: Every evening | ORAL | 0 refills | Status: DC
Start: 2021-01-28 — End: 2021-01-28

## 2021-01-28 MED ORDER — LOSARTAN 100 MG TAB
100 mg | ORAL_TABLET | Freq: Every day | ORAL | 0 refills | Status: DC
Start: 2021-01-28 — End: 2021-01-28

## 2021-01-28 MED ORDER — SENNOSIDES-DOCUSATE SODIUM 8.6 MG-50 MG TAB
ORAL_TABLET | Freq: Every evening | ORAL | 0 refills | Status: AC
Start: 2021-01-28 — End: ?

## 2021-01-28 MED ORDER — PREDNISONE 5 MG TABLETS IN A DOSE PACK
5 mg | ORAL_TABLET | ORAL | 0 refills | Status: AC
Start: 2021-01-28 — End: 2021-05-23

## 2021-01-28 MED ORDER — PREDNISONE 5 MG TABLETS IN A DOSE PACK
5 mg | ORAL_TABLET | ORAL | 0 refills | Status: DC
Start: 2021-01-28 — End: 2021-01-28

## 2021-01-28 MED FILL — TYLENOL 325 MG TABLET: 325 mg | ORAL | Qty: 2

## 2021-01-28 MED FILL — MONTELUKAST 10 MG TAB: 10 mg | ORAL | Qty: 1

## 2021-01-28 MED FILL — ALLOPURINOL 100 MG TAB: 100 mg | ORAL | Qty: 1

## 2021-01-28 MED FILL — SPIRONOLACTONE 25 MG TAB: 25 mg | ORAL | Qty: 1

## 2021-01-28 MED FILL — CLONIDINE 0.2 MG TAB: 0.2 mg | ORAL | Qty: 1

## 2021-01-28 MED FILL — POLYETHYLENE GLYCOL 3350 17 GRAM (100 %) ORAL POWDER PACKET: 17 gram | ORAL | Qty: 1

## 2021-01-28 MED FILL — SENOKOT-S 8.6 MG-50 MG TABLET: ORAL | Qty: 2

## 2021-01-28 MED FILL — PREDNISONE 20 MG TAB: 20 mg | ORAL | Qty: 2

## 2021-01-28 MED FILL — ELIQUIS 5 MG TABLET: 5 mg | ORAL | Qty: 2

## 2021-01-28 MED FILL — LOSARTAN 50 MG TAB: 50 mg | ORAL | Qty: 2

## 2021-01-28 MED FILL — ONDANSETRON (PF) 4 MG/2 ML INJECTION: 4 mg/2 mL | INTRAMUSCULAR | Qty: 2

## 2021-01-28 MED FILL — QUETIAPINE 100 MG TAB: 100 mg | ORAL | Qty: 1

## 2021-01-28 MED FILL — ROSUVASTATIN 10 MG TAB: 10 mg | ORAL | Qty: 1

## 2021-01-28 MED FILL — AMLODIPINE 5 MG TAB: 5 mg | ORAL | Qty: 1

## 2021-01-28 NOTE — Unmapped (Signed)
Formatting of this note might be different from the original.  Bedside shift change report given to Geryl Councilman, RN   (Cabin crew) by French Ana, RN (offgoing nurse). Report included the following information SBAR, Kardex, and MAR.           Electronically signed by Geryl Councilman, RN at 01/28/2021  7:28 AM EDT

## 2021-01-28 NOTE — Unmapped (Signed)
Formatting of this note is different from the original.  [facility]    Face to Face Encounter    Patient?s Name: [name] [name]    Date of Birth: 18-Sep-1950    Primary Diagnosis: Acute CHF (congestive heart failure) (HCC) [I50.9]    Date of Face to Face:   01/28/2021           Face to Face Encounter findings are related to primary reason for home care:   yes.     1. I certify that the patient needs intermittent care as follows: skilled nursing care:  skilled observation/assessment, patient education  physical therapy: strengthening, stretching/ROM, transfer training, gait/stair training, balance training, and pt/caregiver education  occupational therapy:  ADL safety (ie. cooking, bathing, dressing), ROM, and pt/caregiver education    2. I certify that this patient is homebound, that is: 1) patient requires the use of a walker and wheelchair device, special transportation, or assistance of another to leave the home; or 2) patient's condition makes leaving the home medically contraindicated; and 3) patient has a normal inability to leave the home and leaving the home requires considerable and taxing effort.  Patient may leave the home for infrequent and short duration for medical reasons, and occasional absences for non-medical reasons. Homebound status is due to the following functional limitations: Patient with increased shortness of breath and elevated heart rate with ambulation greater than 20 feet limiting patient's ability to ambulate safely within the community.  Patient with strength deficits limiting the performance of all ADL's without caregiver assistance or the use of an assistive device.  Patient with poor safety awareness and is at risk for falls without assistance of another person and the use of an assistive device.  Patient with poor ambulation endurance limiting their safe ability to ascend/descend the required number of steps to leave the home.    3. I certify that this patient is  under my care and that I, or a nurse practitioner or physician?s assistant, or clinical nurse specialist, or certified nurse midwife, working with me, had a Face-to-Face Encounter that meets the physician Face-to-Face Encounter requirements.  The following are the clinical findings from the Face-to-Face encounter that support the need for skilled services and is a summary of the encounter:     See discharge summary    Angela Adam  01/28/2021    THE FOLLOWING TO BE COMPLETED BY THE COMMUNITY PHYSICIAN:    I concur with the findings described above from the F2F encounter that this patient is homebound and in need of a skilled service.    Certifying Physician: _____________________________________      Printed Certifying Physician Name: _____________________________________    Date: _________________      Electronically signed by Angela Adam at 01/28/2021  2:41 PM EDT

## 2021-01-28 NOTE — Progress Notes (Signed)
Progress Notes by Verlan Friends, MD at 01/28/21 [phone]                Author: Verlan Friends, MD  Service: Pulmonary Disease  Author Type: Physician       Filed: 01/29/21 1512  Date of Service: 01/28/21 0954  Status: Signed          Editor: Verlan Friends, MD (Physician)                       [facility] PULMONARY AND CRITICAL CARE MEDICINE            Name:  [name] [name]  MRN:  [id number]          DOB:  07/17/1950  Hospital:  [facility]          Date:  01/28/2021               IMPRESSION:     ??    Shortness of breath, shortness of breath, and wheezing due to COPD with exacerbation   ??  ABG demonstrated primary metabolic alkalosis, and secondary respiratory acidosis   ??  Asthma/COPD with exacerbation   ??  Venous duplex of upper extremity obtained on 01/24/2021 demonstrated acute deep venous thrombosis of left upper extremity with occlusive thrombus in subclavian vein   ??  GERD   ??  PTSD              PLAN:     ??    Continue BiPAP on regular basis during nighttime, and as needed basis during daytime.  Titrate supplemental oxygen through nasal cannula,  and target oxygen saturation greater than 88%   ??  Continue p.o. prednisone, taper slowly    ??  Continue Spiriva 2.5 mcg 2 inhalations a day, and  Breo 100/25 mcg 1 inhalation  a day (patient is on Advair 100/50 twice a day).  Continue DuoNeb through nebulizer every 6 hours.   ??  Avoid excessive diuresis in light of ABG demonstrating metabolic alkalosis, this may deteriorate PCO2 retention   ??  DVT prophylaxis: On Eliquis for acute DVT          Subjective/Interval History:              Review of Systems    Unable to perform ROS: Other          Objective:     Vital Signs:       Visit Vitals   BP  129/86 (BP Patient Position: Supine)      Pulse  71      Temp  98.2 ??F (36.8 ??C)      Resp  18      Ht  5\' 7"  (1.702 m)      Wt  103.1 kg (227 lb 4.8 oz)      SpO2  95%      BMI  35.60 kg/m??                O2 Device: Nasal cannula     O2 Flow  Rate (L/min): 2 l/min       Temp (24hrs), Avg:98 ??F (36.7 ??C), Min:97.3 ??F (36.3 ??C), Max:98.8 ??F (37.1 ??C)           Intake/Output:    Last shift:      No intake/output data recorded.   Last 3 shifts: 08/16 1901 - 08/18 0700   In: 480 [P.O.:480]   Out: 401 [Urine:400]  Intake/Output Summary (Last 24 hours) at 01/28/2021 0954   Last data filed at 01/27/2021 1456     Gross per 24 hour        Intake  --        Output  1 ml        Net  -1 ml               Physical Exam   Vitals and nursing note reviewed.     HENT:       Mouth/Throat:       Mouth: Mucous membranes are moist.    Eyes:       Pupils: Pupils are equal, round, and reactive to light.     Cardiovascular:       Rate and Rhythm: Regular rhythm.    Pulmonary:       Breath sounds: No wheezing.     Abdominal:       General: Bowel sounds are normal.       Palpations: Abdomen is soft.     Musculoskeletal:          General: No swelling.    Skin:      General: Skin is warm and dry.    Neurological:       General: No focal deficit present.       Mental Status: She is alert and oriented to person, place, and time.               DATA:   Labs:   No results for input(s): WBC, HGB, HCT, PLT, HGBEXT, HCTEXT, PLTEXT, HGBEXT, HCTEXT, PLTEXT in the last 72 hours.        Recent Labs            01/27/21   0537  01/25/21   1524     NA  136   --      K  4.4   --      CL  96*   --      CO2  35*  40.0*     GLU  169*   --      BUN  19   --      CREA  0.69   --          CA  9.5   --              Recent Labs           01/25/21   1524     PH  7.420     PCO2  58.9*     PO2  82.0     HCO3  38.2*        FIO2  35                All Micro Results              None                      Imaging:   [] I  have personally reviewed the patients radiographs   [] Radiographs  reviewed with radiologist    []  No change from prior, tubes and lines in adequate position   []  Improved   []  Worsening            , MD

## 2021-01-28 NOTE — Progress Notes (Signed)
Problem: Falls - Risk of  Goal: *Absence of Falls  Description: Document [name] [name] Fall Risk and appropriate interventions in the flowsheet.  Outcome: Progressing Towards Goal  Note: Fall Risk Interventions:  Mobility Interventions: Assess mobility with egress test, Bed/chair exit alarm         Medication Interventions: Assess postural VS orthostatic hypotension, Bed/chair exit alarm    Elimination Interventions: Bed/chair exit alarm, Call light in reach              Problem: Patient Education: Go to Patient Education Activity  Goal: Patient/Family Education  Outcome: Progressing Towards Goal     Problem: Pressure Injury - Risk of  Goal: *Prevention of pressure injury  Description: Document Braden Scale and appropriate interventions in the flowsheet.  Outcome: Progressing Towards Goal  Note: Pressure Injury Interventions:  Sensory Interventions: Assess need for specialty bed, Assess changes in LOC    Moisture Interventions: Absorbent underpads, Apply protective barrier, creams and emollients    Activity Interventions: Assess need for specialty bed, Pressure redistribution bed/mattress(bed type)    Mobility Interventions: HOB 30 degrees or less, Pressure redistribution bed/mattress (bed type)    Nutrition Interventions: Document food/fluid/supplement intake    Friction and Shear Interventions: Apply protective barrier, creams and emollients, HOB 30 degrees or less                Problem: Patient Education: Go to Patient Education Activity  Goal: Patient/Family Education  Outcome: Progressing Towards Goal

## 2021-01-28 NOTE — Progress Notes (Signed)
Formatting of this note is different from the original.  Images from the original note were not included.    Medical Necessity Certification Statement for Non-Emergency Ambulance Services    SECTION 1 - GENERAL INFORMATION    Patient: [name] [name] Age: 70 y.o. Sex: female    Date of Birth: July 06, 1950 Admit Date: 01/24/2021 PCP: Ernst Breach   MRN: [id number]  CSN: [id number]  Room: 5208/5208     Chief Complaint:   Chief Complaint   Patient presents with    Arm swelling    Congestive Heart Failure     Height/Weight: 227lbs Body mass index is 35.6 kg/m. Body mass index is 35.6 kg/m.  Weight:     Weight: 103.1 kg (227 lb 4.8 oz)  Weight Source: Weight Source: Bed scale    Transport From:  '[x]' Hospital    '[]' SNF   '[]' Residence   '[]' Other: [place]  Transport To:      '[]' Hospital    '[]' SNF   '[x]' Residence   '[]' Other:   Address:    [place]     Medicare:   Insurance Information         [employer] [place] [employer] Phone: --    Subscriber: [place], [place] Subscriber#: [id number]    Group#: [id number] Precert#: --         Transport Date: January 28, 2021 (Valid for round trips this date, or for scheduled repetitive trips for 60 days from date signed below.)  Origin: [place]  Destination: Home or Self Care '@PATIENTADDRESS' @    Is the Patient's stay covered under Medicare Part A (PPS/DRG?)  '[x]'  Yes    '[]'  No     Closest appropriate facility?  '[x]'  Yes    '[]'  No     If no, why was the patient transported to another facility?   If hospital to hospital transfer, describe services needed at 2nd facility not available at 1st facility:   If hospice Pt, is this transport related to Pt's terminal illness?  Yes    '[]'  No   Describe:     [place]  Ambulance transportation is medically necessary only if other means of transport are contraindicated or would be potentially   Harmful to the patient. To meet this requirement, the patient must be either "bed confined" or suffer from  condition such   that transport by means other than an ambulance is contraindicated by the patient's condition.The following questions   must be addressed by the healthcare professional signing below for this form to be valid:    1) Describe the MEDICAL CONDITION space (physical and/or mental) of this patient AT [place]       that requires the patient to be transported in an ambulance, and why transport in an ambulance, and why transport by       other means is contraindicated by the patient's condition:     2) Is this patient "bed confined" as defined below?  '[x]'  Yes    '[]'  No           To be "bed confined" the patient must satisfy all 3 of the following criteria:           (1) unable to get up from bed without assistance; AND           (2) unable to ambulate; AND           (3) unable to sit in a  chair or wheelchair.    3) Can this patient safely be transported by car or wheelchair van (I.e., may safely sit during transport,       without an attendant or monitoring?)      '[]'  Yes    '[x]'  No    4) In addition to completing questions 1-3 above, please check any of the following conditions that apply*:       *Note: Supporting documentation for any boxes checked must be maintained in the patient's medical records    '[]' Contractures   '[]' Non-healed fractures   '[]' Patient is confused    '[]' Patient is comatose    '[x]' Moderate/severe pain on movement   '[]' Danger to self/others   '[]' IV meds/fluids required  '[]' PIV:   '[]'  Yes    '[]'  No  '[]' Patient is combative  '[]' Need, or possible need, for restraints   '[]' DVT requires elevation of a lower extremity   '[]' Medical attendant required   '[x]' Requires oxygen - unable to self-administer   '[]' Special handling/isolation/infection control precautions required  '[x]' Unable to tolerate seated position for time needed to transport   '[]' Hemodynamic monitoring required enroute    '[]' Unable to sit in a chair or wheelchair due to decubitus ulcers or other wounds   '[]' Cardiac monitoring  required enroute   '[]' Morbid obesity requires additional personnel/equipment to safely handle patient    '[]' Orthopedic device (backboard, halo, pins, traction, brace, wedge, etc.) requiring special handling during transport   '[x]' Other (specificy) non ambulatory     SECTION III - SIGNATURE OF PHYSICIAN OR OTHER AUTHORIZED HEALTHCARE PROFESSIONAL  I certify that the above information is accurate based on my evaluation of this patient, and that the medical necessity provisions of 42 CFR 410.40 (e)(1) are met, requiring that this patient be transported by ambulance.  I understand this information will be used by the centers for Medicare and Medicaid Services (CMS) to support the determination of medical necessity for ambulance services.  I represent that I am the beneficiary's attending physician; or an employee of the beneficiary's attending physician, or the hospital or facility where the beneficiary is being treated and from which the beneficiary is being transported; that I have personal knowledge of the beneficiary's condition at the time of transport; and that I meet all Medicare regulations and applicable state licensure laws for the credentialed indicated.    '[x]' If this box is checked, I also certify that the patient is physically or mentally incapable of signing the ambulance services claim form and that the institution with which I am affiliated has furnished care, services or assistance to the patient.  My signature below was made on behalf of the patient pursuant to 30 VGK815.94(L)(0).  In accordance with 42 [id number], the specific reason(s) that the patient is physically or mentally incapable of signing the claim form is as follows.    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  DM2 (diabetes mellitus, type 2) (HCC)     GERD (gastroesophageal reflux disease)     Hepatitis C     HEP C    HTN (hypertension)     Lung nodule, multiple     (CT 10/2016) Small left upper lobe and 1.7 x 1.6 cm left lower lobe nodules not significantly changed from 07/23/2016 and 03/22/2016    Marijuana use     PTSD (post-traumatic stress disorder)     lived through the [place] in 1993    Thoracic ascending aortic aneurysm [facility])     4.4cm noted on CT Chest (10/2016)    Thyroid nodule     2.5 cm stable right thyroid nodule (CT 10/2016)     Medications     Medications   allopurinoL (ZYLOPRIM) tablet 100 mg (100 mg Oral Given 01/28/21 0842)   montelukast (SINGULAIR) tablet 10 mg (10 mg Oral Given 01/28/21 0842)   rosuvastatin (CRESTOR) tablet 5 mg (5 mg Oral Given 01/27/21 2150)   naloxone [facility]) injection 0.1 mg (has no administration in time range)   acetaminophen (TYLENOL) tablet 650 mg (650 mg Oral Given 01/28/21 0532)     Or   acetaminophen (TYLENOL) solution 650 mg ( Oral See Alternative 01/28/21 0532)     Or   acetaminophen (TYLENOL) suppository 650 mg ( Rectal See Alternative 01/28/21 0532)   ondansetron (ZOFRAN) injection 4 mg (4 mg IntraVENous Given 01/28/21 0532)   apixaban (ELIQUIS) tablet 10 mg (10 mg Oral Given 01/28/21 0842)   apixaban (ELIQUIS) tablet 5 mg (has no administration in time range)   dextrose (D50W) injection syrg 5-25 g (has no administration in time range)   glucagon (GLUCAGEN) injection 1 mg (has no administration in time range)   insulin glargine (LANTUS) injection 1-100 Units (23 Units SubCUTAneous Given 01/27/21 2332)   insulin lispro (HUMALOG) injection 1-100 Units (6 Units SubCUTAneous Given 01/28/21 1356)   insulin lispro (HUMALOG) injection 1-100 Units (1 Units SubCUTAneous Given 01/27/21 2332)   amLODIPine  (NORVASC) tablet 5 mg (5 mg Oral Given 01/28/21 0842)   cloNIDine HCL (CATAPRES) tablet 0.2 mg (0.2 mg Oral Given 01/28/21 1356)   tiotropium bromide (SPIRIVA RESPIMAT) 2.5 mcg /actuation (2 Puffs Inhalation Refused 01/28/21 0900)   losartan (COZAAR) tablet 100 mg (100 mg Oral Given 01/28/21 0842)   senna-docusate (PERICOLACE) 8.6-50 mg per tablet 2 Tablet (2 Tablets Oral Given 01/27/21 2151)   polyethylene glycol (MIRALAX) packet 17 g (17 g Oral Given 01/28/21 0842)   QUEtiapine (SEROquel) tablet 100 mg (100 mg Oral Given 01/27/21 2151)   spironolactone (ALDACTONE) tablet 25 mg (25 mg Oral Given 01/28/21 0842)   predniSONE (DELTASONE) tablet 40 mg (40 mg Oral Given 01/28/21 0842)   sodium chloride (NS) flush 5-10 mL (10 mL IntraVENous Given 01/24/21 0833)   furosemide (LASIX) injection 40 mg (40 mg IntraVENous Given 01/24/21 6010)   acetaminophen (TYLENOL) tablet 1,000 mg (1,000 mg Oral Given 01/24/21 0858)   apixaban (ELIQUIS) tablet 10 mg (10 mg Oral Given 01/24/21 1258)   polyethylene glycol (MIRALAX) packet 17 g (17 g Oral Given 01/26/21 0659)     Allergies     Allergies   Allergen Reactions    Chocolate [Cocoa] Sneezing     Confirms allergy but reports "I still eat it"     ED Course/Discharge       ICD-10-CM ICD-9-CM   1. Acute hypoxemic respiratory failure (HCC)  J96.01 518.81   2. Acute congestive heart failure,  unspecified heart failure type (HCC)  I50.9 428.0   3. Arm DVT (deep venous thromboembolism), acute, left (HCC)  I82.622 453.82     Home or Self Care   Current Discharge Medication List       START taking these medications    Details   predniSONE (STERAPRED) 5 mg dose pack See administration instruction per 67m dose pack  Qty: 21 Tablet, Refills: 0  Start date: 01/28/2021     senna-docusate (PERICOLACE) 8.6-50 mg per tablet Take 2 Tablets by mouth nightly.  Qty: 100 Tablet, Refills: 0  Start date: 01/28/2021     apixaban (Eliquis DVT-PE Treat 30D Start) 5 mg (74 tabs) starter pack Take 10 mg (two 5 mg tablets) by  mouth twice a day for 2 days till 8/20  Followed by 5 mg (one 5 mg tablet) by mouth twice a day starting 8/21  Qty: 1 Dose Pack, Refills: 0  Start date: 01/28/2021         CONTINUE these medications which have CHANGED    Details   insulin aspart U-100 (NovoLOG U-100 Insulin aspart) 100 unit/mL injection 7 Units by SubCUTAneous route Before breakfast, lunch, and dinner.  Qty: 10 mL, Refills: 0  Start date: 01/28/2021     insulin glargine (Lantus Solostar U-100 Insulin) 100 unit/mL (3 mL) inpn 25 Units by SubCUTAneous route nightly.  Qty: 1 Pen, Refills: 0  Start date: 01/28/2021     losartan (COZAAR) 100 mg tablet Take 1 Tablet by mouth daily.  Qty: 30 Tablet, Refills: 0  Start date: 01/28/2021     cloNIDine HCL (CATAPRES) 0.2 mg tablet Take 1 Tablet by mouth two (2) times a day.  Qty: 60 Tablet, Refills: 0  Start date: 01/28/2021         CONTINUE these medications which have NOT CHANGED    Details   allopurinoL (ZYLOPRIM) 100 mg tablet Take 1 Tablet by mouth two (2) times a day.  Qty: 180 Tablet, Refills: 1     polyethylene glycol (MIRALAX) 17 gram packet Take 1 Packet by mouth daily as needed for Constipation. Indications: constipation  Qty: 30 Packet, Refills: 1     fluticasone propionate (FLONASE) 50 mcg/actuation nasal spray 2 Sprays by Both Nostrils route daily as needed for Rhinitis or Allergies. Indications: inflammation of the nose due to an allergy  Qty: 1 Each, Refills: 0     butalbital-acetaminophen-caffeine (FIORICET, ESGIC) 50-325-40 mg per tablet butalbital-acetaminophen-caffeine 50 mg-325 mg-40 mg tablet   TAKE 1 TABLET BY MOUTH EVERY 4 HOURS AS NEEDED FOR HEADACHE     potassium chloride SR (K-TAB) 20 mEq tablet potassium chloride ER 20 mEq tablet,extended release   TAKE 1 TABLET BY MOUTH ONCE DAILY     acetaminophen (TYLENOL) 325 mg tablet Take 2 Tabs by mouth every four (4) hours as needed for Pain.  Qty: 20 Tab, Refills: 0     albuterol (PROVENTIL HFA, VENTOLIN HFA, PROAIR HFA) 90 mcg/actuation inhaler  Take 2 Puffs by inhalation every four (4) hours as needed for Wheezing.  Qty: 1 Inhaler, Refills: 1     dilTIAZem CD (CARDIZEM CD) 240 mg ER capsule Take 1 Cap by mouth daily.  Qty: 30 Cap, Refills: 1     cetirizine (ZYRTEC) 10 mg tablet Take 1 Tab by mouth daily.  Qty: 30 Tab, Refills: 1     albuterol (ACCUNEB) 1.25 mg/3 mL nebu USE 1 VIAL IN NEBULIZER EVERY 6 HOURS AS NEEDED FOR  SHORTNESS  OF  BREATH,  WHEEZING  Qty: 270 mL, Refills: 1     metFORMIN ER (GLUCOPHAGE XR) 500 mg tablet TAKE 1 TABLET TWICE DAILY  Qty: 180 Tab, Refills: 1     montelukast (SINGULAIR) 10 mg tablet TAKE 1 TABLET EVERY DAY  Qty: 90 Tab, Refills: 3     magnesium oxide (MAG-OX) 400 mg tablet Take 400 mg by mouth daily.     QUEtiapine SR (SEROQUEL XR) 150 mg sr tablet Take 150 mg by mouth nightly as needed.     tiotropium bromide (SPIRIVA RESPIMAT) 2.5 mcg/actuation inhaler Take 1 Puff by inhalation daily.  Qty: 1 Inhaler, Refills: 0    Associated Diagnoses: COPD with acute exacerbation (HCC)     fluticasone propion-salmeterol (ADVAIR DISKUS) 100-50 mcg/dose diskus inhaler Take 1 Puff by inhalation two (2) times a day.  Qty: 1 Inhaler, Refills: 0     rosuvastatin (CRESTOR) 5 mg tablet TAKE 1 TABLET BY MOUTH NIGHTLY  Qty: 90 Tab, Refills: 1    Associated Diagnoses: Type 2 diabetes mellitus with diabetic neuropathy, without long-term current use of insulin (HCC)         STOP taking these medications      furosemide (LASIX) 20 mg tablet Comments:   Reason for Stopping:       predniSONE (DELTASONE) 10 mg tablet Comments:   Reason for Stopping:              Electronically signed by:  Malachi Carl  January 28, 2021  2:43 PM    Name: __________________________________________________________________  Printed Name and [place] Professional (MD, DO, RN, etc)  *Form must be signed only by patient's attending physician for scheduled, repetitive transports.  For non-repetitive ambulance transports, if unable to obtain the signature  of the attending physician,  Any of the following may sign (please check appropriate box below):    '[]' Physician Assistant   '[]' Clinical Nurse Specialist  '[x]' Case Manager  '[]' Social Worker   '[]' Nurse Practitioner     '[]' Registered Nurse             '[]' Discharge Planner    Dispatch Phone: [phone]  Dispatch Fax:     [phone]   Electronically signed by Malachi Carl at 01/28/2021  2:46 PM EDT

## 2021-01-28 NOTE — Progress Notes (Signed)
Discharge Plan:   home with home health     Discharge Date:     01/28/2021     Home Health Needed:     yes     [facility]     FOC given:yes     Confirmed start of care with the home health agency and spoke with:      e-mail sent per protocol    Transportation: Medical        Transport Time:    1500    Family, MD, patient, nurse and facility aware of pickup time    yes

## 2021-01-28 NOTE — Discharge Summary (Signed)
Formatting of this note is different from the original.  Discharge Summary    Patient: [name] [name]               Sex: female          DOA: 01/24/2021      Date of Birth:  [birth year]      Age:  70 y.o.        LOS:  LOS: 4 days                Admit Date: 01/24/2021    Discharge Date: 01/28/2021    Admission Diagnoses:   Discharge Diagnoses:   1.  Acute on chronic hypoxic and hypercapnic respiratory failure  2.  COPD with acute exacerbation  3.  Acute DVT L arm  4.  Hypertensive urgency  5.  DM type II controlled  6.  Hypokalemia  7.  CHF chronic diastolic  8.  Obesity with possible OSA  9.  Metabolic alkalosis  10.  Constipation    Current Discharge Medication List       START taking these medications    Details   predniSONE (STERAPRED) 5 mg dose pack See administration instruction per 5mg  dose pack  Qty: 21 Tablet, Refills: 0     senna-docusate (PERICOLACE) 8.6-50 mg per tablet Take 2 Tablets by mouth nightly.  Qty: 100 Tablet, Refills: 0     apixaban (Eliquis DVT-PE Treat 30D Start) 5 mg (74 tabs) starter pack Take 10 mg (two 5 mg tablets) by mouth twice a day for 2 days till 8/20  Followed by 5 mg (one 5 mg tablet) by mouth twice a day starting 8/21  Qty: 1 Dose Pack, Refills: 0         CONTINUE these medications which have CHANGED    Details   insulin aspart U-100 (NovoLOG U-100 Insulin aspart) 100 unit/mL injection 7 Units by SubCUTAneous route Before breakfast, lunch, and dinner.  Qty: 10 mL, Refills: 0     insulin glargine (Lantus Solostar U-100 Insulin) 100 unit/mL (3 mL) inpn 25 Units by SubCUTAneous route nightly.  Qty: 1 Pen, Refills: 0     losartan (COZAAR) 100 mg tablet Take 1 Tablet by mouth daily.  Qty: 30 Tablet, Refills: 0     cloNIDine HCL (CATAPRES) 0.2 mg tablet Take 1 Tablet by mouth two (2) times a day.  Qty: 60 Tablet, Refills: 0         CONTINUE these medications which have NOT CHANGED    Details   allopurinoL (ZYLOPRIM) 100 mg tablet Take 1 Tablet by mouth two (2) times a day.  Qty: 180 Tablet,  Refills: 1     polyethylene glycol (MIRALAX) 17 gram packet Take 1 Packet by mouth daily as needed for Constipation. Indications: constipation  Qty: 30 Packet, Refills: 1     fluticasone propionate (FLONASE) 50 mcg/actuation nasal spray 2 Sprays by Both Nostrils route daily as needed for Rhinitis or Allergies. Indications: inflammation of the nose due to an allergy  Qty: 1 Each, Refills: 0     butalbital-acetaminophen-caffeine (FIORICET, ESGIC) 50-325-40 mg per tablet butalbital-acetaminophen-caffeine 50 mg-325 mg-40 mg tablet   TAKE 1 TABLET BY MOUTH EVERY 4 HOURS AS NEEDED FOR HEADACHE     potassium chloride SR (K-TAB) 20 mEq tablet potassium chloride ER 20 mEq tablet,extended release   TAKE 1 TABLET BY MOUTH ONCE DAILY     acetaminophen (TYLENOL) 325 mg tablet Take 2 Tabs by mouth every four (4) hours  as needed for Pain.  Qty: 20 Tab, Refills: 0     albuterol (PROVENTIL HFA, VENTOLIN HFA, PROAIR HFA) 90 mcg/actuation inhaler Take 2 Puffs by inhalation every four (4) hours as needed for Wheezing.  Qty: 1 Inhaler, Refills: 1     dilTIAZem CD (CARDIZEM CD) 240 mg ER capsule Take 1 Cap by mouth daily.  Qty: 30 Cap, Refills: 1     cetirizine (ZYRTEC) 10 mg tablet Take 1 Tab by mouth daily.  Qty: 30 Tab, Refills: 1     albuterol (ACCUNEB) 1.25 mg/3 mL nebu USE 1 VIAL IN NEBULIZER EVERY 6 HOURS AS NEEDED FOR  SHORTNESS  OF  BREATH,  WHEEZING  Qty: 270 mL, Refills: 1     metFORMIN ER (GLUCOPHAGE XR) 500 mg tablet TAKE 1 TABLET TWICE DAILY  Qty: 180 Tab, Refills: 1     montelukast (SINGULAIR) 10 mg tablet TAKE 1 TABLET EVERY DAY  Qty: 90 Tab, Refills: 3     magnesium oxide (MAG-OX) 400 mg tablet Take 400 mg by mouth daily.     QUEtiapine SR (SEROQUEL XR) 150 mg sr tablet Take 150 mg by mouth nightly as needed.     tiotropium bromide (SPIRIVA RESPIMAT) 2.5 mcg/actuation inhaler Take 1 Puff by inhalation daily.  Qty: 1 Inhaler, Refills: 0    Associated Diagnoses: COPD with acute exacerbation (HCC)     fluticasone  propion-salmeterol (ADVAIR DISKUS) 100-50 mcg/dose diskus inhaler Take 1 Puff by inhalation two (2) times a day.  Qty: 1 Inhaler, Refills: 0     rosuvastatin (CRESTOR) 5 mg tablet TAKE 1 TABLET BY MOUTH NIGHTLY  Qty: 90 Tab, Refills: 1    Associated Diagnoses: Type 2 diabetes mellitus with diabetic neuropathy, without long-term current use of insulin (HCC)         STOP taking these medications      furosemide (LASIX) 20 mg tablet Comments: Due to hypokalemia  Reason for Stopping:       predniSONE (DELTASONE) 10 mg tablet Comments: Started on Dosepak  Reason for Stopping:          Discharge Medications:     Current Discharge Medication List       START taking these medications    Details   predniSONE (STERAPRED) 5 mg dose pack See administration instruction per  dose pack  Qty: 21 Tablet, Refills: 0  Start date: 01/28/2021     senna-docusate (PERICOLACE) 8.6-50 mg per tablet Take 2 Tablets by mouth nightly.  Qty: 100 Tablet, Refills: 0  Start date: 01/28/2021     apixaban (Eliquis DVT-PE Treat 30D Start) 5 mg (74 tabs) starter pack Take 10 mg (two 5 mg tablets) by mouth twice a day for 2 days till 8/20  Followed by 5 mg (one 5 mg tablet) by mouth twice a day starting 8/21  Qty: 1 Dose Pack, Refills: 0  Start date: 01/28/2021         CONTINUE these medications which have CHANGED    Details   insulin aspart U-100 (NovoLOG U-100 Insulin aspart) 100 unit/mL injection 7 Units by SubCUTAneous route Before breakfast, lunch, and dinner.  Qty: 10 mL, Refills: 0  Start date: 01/28/2021     insulin glargine (Lantus Solostar U-100 Insulin) 100 unit/mL (3 mL) inpn 25 Units by SubCUTAneous route nightly.  Qty: 1 Pen, Refills: 0  Start date: 01/28/2021     losartan (COZAAR) 100 mg tablet Take 1 Tablet by mouth daily.  Qty: 30 Tablet, Refills: 0  Start  date: 01/28/2021     cloNIDine HCL (CATAPRES) 0.2 mg tablet Take 1 Tablet by mouth two (2) times a day.  Qty: 60 Tablet, Refills: 0  Start date: 01/28/2021         CONTINUE these  medications which have NOT CHANGED    Details   allopurinoL (ZYLOPRIM) 100 mg tablet Take 1 Tablet by mouth two (2) times a day.  Qty: 180 Tablet, Refills: 1     polyethylene glycol (MIRALAX) 17 gram packet Take 1 Packet by mouth daily as needed for Constipation. Indications: constipation  Qty: 30 Packet, Refills: 1     fluticasone propionate (FLONASE) 50 mcg/actuation nasal spray 2 Sprays by Both Nostrils route daily as needed for Rhinitis or Allergies. Indications: inflammation of the nose due to an allergy  Qty: 1 Each, Refills: 0     butalbital-acetaminophen-caffeine (FIORICET, ESGIC) 50-325-40 mg per tablet butalbital-acetaminophen-caffeine 50 mg-325 mg-40 mg tablet   TAKE 1 TABLET BY MOUTH EVERY 4 HOURS AS NEEDED FOR HEADACHE     potassium chloride SR (K-TAB) 20 mEq tablet potassium chloride ER 20 mEq tablet,extended release   TAKE 1 TABLET BY MOUTH ONCE DAILY     acetaminophen (TYLENOL) 325 mg tablet Take 2 Tabs by mouth every four (4) hours as needed for Pain.  Qty: 20 Tab, Refills: 0     albuterol (PROVENTIL HFA, VENTOLIN HFA, PROAIR HFA) 90 mcg/actuation inhaler Take 2 Puffs by inhalation every four (4) hours as needed for Wheezing.  Qty: 1 Inhaler, Refills: 1     dilTIAZem CD (CARDIZEM CD) 240 mg ER capsule Take 1 Cap by mouth daily.  Qty: 30 Cap, Refills: 1     cetirizine (ZYRTEC) 10 mg tablet Take 1 Tab by mouth daily.  Qty: 30 Tab, Refills: 1     albuterol (ACCUNEB) 1.25 mg/3 mL nebu USE 1 VIAL IN NEBULIZER EVERY 6 HOURS AS NEEDED FOR  SHORTNESS  OF  BREATH,  WHEEZING  Qty: 270 mL, Refills: 1     metFORMIN ER (GLUCOPHAGE XR) 500 mg tablet TAKE 1 TABLET TWICE DAILY  Qty: 180 Tab, Refills: 1     montelukast (SINGULAIR) 10 mg tablet TAKE 1 TABLET EVERY DAY  Qty: 90 Tab, Refills: 3     magnesium oxide (MAG-OX) 400 mg tablet Take 400 mg by mouth daily.     QUEtiapine SR (SEROQUEL XR) 150 mg sr tablet Take 150 mg by mouth nightly as needed.     tiotropium bromide (SPIRIVA RESPIMAT) 2.5 mcg/actuation inhaler  Take 1 Puff by inhalation daily.  Qty: 1 Inhaler, Refills: 0    Associated Diagnoses: COPD with acute exacerbation (HCC)     fluticasone propion-salmeterol (ADVAIR DISKUS) 100-50 mcg/dose diskus inhaler Take 1 Puff by inhalation two (2) times a day.  Qty: 1 Inhaler, Refills: 0     rosuvastatin (CRESTOR) 5 mg tablet TAKE 1 TABLET BY MOUTH NIGHTLY  Qty: 90 Tab, Refills: 1    Associated Diagnoses: Type 2 diabetes mellitus with diabetic neuropathy, without long-term current use of insulin (HCC)         STOP taking these medications      furosemide (LASIX) 20 mg tablet Comments:   Reason for Stopping:       predniSONE (DELTASONE) 10 mg tablet Comments:   Reason for Stopping:          Follow-up: PCP in a week  Pulmonary as scheduled  Discharge Condition: Stable    Activity: As tolerated    Diet: Cardiac and  diabetic    Labs:  Labs: Results:   Chemistry Recent Labs     01/27/21  0537 01/25/21  1524   GLU 169*  --    NA 136  --    K 4.4  --    CL 96*  --    CO2 35* 40.0*   BUN 19  --    CREA 0.69  --    CA 9.5  --    AGAP 5  --      CBC w/Diff No results found for this visit on 01/24/21 (from the past 12 hour(s)).   Cardiac Enzymes Lab Results   Component Value Date/Time    CK 165 09/08/2017 09:30 AM    CK - MB 0.7 09/08/2017 09:30 AM    CK-MB Index 0.4 09/08/2017 09:30 AM    Troponin-I <0.015 08/22/2019 01:04 PM     Coagulation Lab Results   Component Value Date/Time    INR 0.9 05/26/2020 08:18 PM    INR 1.0 08/28/2018 01:20 PM    INR 1.1 09/08/2017 09:30 AM    Prothrombin time 11.0 05/26/2020 08:18 PM    Prothrombin time 11.7 08/28/2018 01:20 PM    Prothrombin time 13.2 (H) 09/08/2017 09:30 AM     Lipid Panel Lab Results   Component Value Date/Time    Cholesterol, total 119 (L) 06/30/2020 02:08 PM    HDL Cholesterol 39 (L) 06/30/2020 02:08 PM    LDL, calculated 39 06/30/2020 02:08 PM    VLDL, calculated 31.8 06/10/2016 01:00 PM    Triglyceride 203 (H) 06/30/2020 02:08 PM    CHOL/HDL Ratio 3.1 06/30/2020 02:08 PM     BNP  Lab Results   Component Value Date/Time    NT pro-BNP 27 01/24/2021 08:20 AM    NT pro-BNP 9.0 05/26/2020 11:20 AM    NT pro-BNP 644.0 (H) 08/22/2019 01:04 PM    NT pro-BNP 11.0 06/01/2019 11:50 AM    NT pro-BNP 18.0 08/22/2018 02:10 PM     Liver Enzymes No results for input(s): TP, ALB, TBIL, AP in the last 72 hours.    No lab exists for component: SGOT, GPT, DBIL   Vitamins Lab Results   Component Value Date/Time    Vitamin D, 25-OH, Total 62.2 03/07/2019 03:10 PM     Lab Results   Component Value Date/Time    Vitamin B12 1,721 (H) 12/16/2014 02:40 PM     Thyroid Studies Lab Results   Component Value Date/Time    TSH 0.170 (L) 06/02/2019 12:43 PM       Imaging/Procedures:   No results found.  CT Results (most recent):  Results from Hospital Encounter encounter on 06/01/19    CT ABD PELV W CONT    Narrative  EXAM: CT ABD PELV W CONT    INDICATION: LLQ abd pain    TECHNIQUE: Axial CT scan of the abdomen and pelvis with   IV contrast  including coronal and sagittal reconstructions.  DICOM format image data is available to non-affiliated external healthcare  facilities or entities on a secure, media free, reciprocally searchable basis  with patient authorization for 12 months following the date of the study.    COMPARISON: 05/07/2019    FINDINGS:  Lower thorax: Bibasilar bullae.    ABDOMEN:  Liver: Unremarkable. No mass.  Gallbladder and bile ducts: Unremarkable. No calcified stones. No ductal  dilatation.  Pancreas: Unremarkable. No ductal dilatation. No mass.  Spleen: Multiple unchanged splenic lesions.  Adrenals: Unremarkable. No mass.  Kidneys and ureters: 2 mm nonobstructing right kidney stone.  Appendix: No findings to suggest appendicitis.  Stomach and bowel: Stomach unremarkable. No obstruction. No mucosal thickening.  No inflammatory changes. Multiple sigmoid diverticula without diverticulitis.  Peritoneum: Unremarkable. No significant fluid collection. No free air.  Lymph nodes: Unremarkable. No enlarged  lymph nodes.  Vasculature: Unremarkable. No aortic aneurysm.  Bones: Total right hip replacement.  Abdominal wall: Unremarkable. No evidence of a hernia.    PELVIS:  Bladder: Unremarkable. No mass.  Reproductive: Hysterectomy.    Impression  IMPRESSION:    1. Multiple sigmoid diverticula without diverticulitis.  2. 2 mm nonobstructing right kidney stone.  3. Total right hip replacement.  4. Hysterectomy.    MRI Results (most recent):  Results from Orders Only encounter on 10/17/19    MRI LUMB SPINE W CONT    XR Results (most recent):  Results from Hospital Encounter encounter on 01/24/21    XR CHEST SNGL V    Narrative  XR CHEST SNGL V    Clinical indication: sob.    Comparison: 01/24/2021    Support devices: None    Lungs: Stable exam demonstrating lucency overlying the left upper lung field,  compatible with chronic bullous changes. Unchanged rounded density overlying the  left midlung field. Remainder the lungs are unchanged.    Cardiomediastinal: Normal morphology for radiographic technique.    Bones/soft tissues: No acute abnormalities.    Impression  IMPRESSION:    Stable exam.    [place] Results (most recent):  Results from Hospital Encounter encounter on 12/24/17    [place] RUQ    Narrative  Examination: Ultrasound right upper quadrant.    INDICATION: Pain.    FINDINGS:    The liver appears grossly unremarkable. No focal hepatic lesions definitely  identified.    The gallbladder appears unremarkable. No gallbladder wall thickening. No  sonographic Murphy's sign. CBD within normal limits roughly measuring 0.3 cm.    Right kidney appears unremarkable and is normal in size. A 4 mm nonobstructing  renal calculus. Tiny cyst on previous CT dated 08/27/2016, not well visualized on  the current exam.    Proximal aorta and IVC appear unremarkable.    Impression  IMPRESSION:  1. No acute abnormalities.  2. No gallbladder wall thickening, gallstones or evidence of acute  cholecystitis.  3. Nonobstructing right renal  calculus.    Results for orders placed or performed during the hospital encounter of 05/26/20   EKG, 12 LEAD, INITIAL   Result Value Ref Range    Ventricular Rate 100 BPM    Atrial Rate 100 BPM    P-R Interval 162 ms    QRS Duration 68 ms    Q-T Interval 352 ms    QTC Calculation (Bezet) 454 ms    Calculated P Axis 76 degrees    Calculated R Axis 30 degrees    Calculated T Axis 60 degrees    Diagnosis       Normal sinus rhythm  Poor R wave progression consistent with possible anterior wall MI of   indeterminate age vs. lead placement variation  Abnormal ECG  When compared with ECG of 26-May-2020 11:06,  Sinus rhythm has replaced Atrial fibrillation  Confirmed by Valentina Lucks, M.D., John ([id number]) on 05/26/2020 3:41:58 PM      EKG Results       Procedure 720 Value Units Date/Time    EKG, 12 LEAD, INITIAL [[id number]]     Order Status: Sent  Consults: PT/OT, pulmonary    Treatment Team: Treatment Team: Attending Provider: Hazel SamsAhmed, Tahir, MD; Consulting Provider: Zoe LanAkkina, Naveen C, MD; Consulting Provider: Roderic ScarceVerma, Sourabh, MD; Consulting Provider: Hazel SamsAhmed, Tahir, MD; Nurse Navigator: Otho NajjarLynch, Mary L; Staff Nurse: Geryl CouncilmanBall, Katelynne, RN; Care Manager: Angela AdamPost, Denise    Significant Diagnostic Studies:   Hospital Course:   Patient presented with a chief complaint of shortness of breath and left arm swelling.  Patient was diagnosed with acute on chronic hypoxic and hypercapnic respiratory failure and left arm DVT.  Patient was admitted to stepdown she was started on BiPAP.  For acute DVT patient was started on Eliquis.  Pulmonary consult was done.  Patient responded well to the treatment.  Patient's condition started improving.  Patient was taken off of BiPAP she was started on supplemental oxygen.  Steroids were changed to p.o.  Patient was continued on frequent breathing treatments.  Patient was subsequently transferred to floor.  PT/OT consult was done.  On the day of discharge patient was seen and examined she was feeling better she  was able to ambulate shortness of breath that improved and she requested to go home.  Physical examination on the day of discharge showed very pleasant elderly female sitting comfortably vitals stable.  Pertinent labs hemoglobin 13 WBC improved to 12,000  Potassium 4.4 creatinine 0.6.  Patient being discharged home with follow-up appointment with her primary care provider and pulmonary as scheduled.  Patient was advised to take her medicine as directed.  Patient was told if her condition gets worse she starts running fever or she is extremely short of breath or she is unable to keep anything down she can call her primary care provider or come to the emergency room.  Copy of the discharge summary will be sent to patient's primary care provider    Dragon medical dictation software was used for portions of this report. Unintended errors may occur.     Hazel Samsahir Ahmed, MD  January 28, 2021    Total time spent: Over 30 min.   Electronically signed by Hazel SamsAhmed, Tahir, MD at 01/29/2021  5:44 PM EDT

## 2021-01-28 NOTE — Progress Notes (Signed)
 Progress Notes  by Lucia Aquas at 01/28/21 1443                Author: Lucia Aquas  Service: CASE MANAGEMENT  Author Type: Care Management       Filed: 01/28/21 1446  Date of Service: 01/28/21 1443  Status: Signed          Editor: Lucia Aquas (Care Management)                    Medical Necessity Certification Statement for Non-Emergency Ambulance Services         SECTION 1 - GENERAL INFORMATION          Patient: [name] [name]  Age: 70 y.o.  Sex: female          Date of Birth: 06/24/1950  Admit Date: 01/24/2021  PCP: Lucious Marko RIGGERS         MRN: [id number]   CSN: [id number]   Room: 5208/5208        Chief Complaint:      Chief Complaint       Patient presents with        ?  Arm swelling        ?  Congestive Heart Failure         Height/Weight: 227lbs Body mass index is 35.6 kg/m. Body mass index is 35.6 kg/m.   Weight:     Weight: 103.1 kg (227 lb 4.8 oz)   Weight Source: Weight Source: Bed scale      Transport From:  [x]  Hospital    [] SNF   [] Residence   []  Other: [facility]   Transport To:      []  Hospital    [] SNF   [x] Residence   []  Other:    Address:    [address] [place]       Medicare:      Insurance Information                                       [employer] MEDICARE/[facility] [place] MEDICARE HMO  Phone:  --             Subscriber:  [name] [name], [name] [name]  Subscriber#:  [id number]             Group#:  [id number]  Precert#:  --                   Transport Date: January 28, 2021 (Valid for round trips this date, or for scheduled repetitive trips for 60 days from date signed below.)   Origin: [facility]   Destination: Home or Self Care @PATIENTADDRESS @      Is the Patient's stay covered under Medicare Part A (PPS/DRG?)  [x]   Yes    []  No      Closest appropriate facility?  [x]  Yes    []  No     If no, why was the patient transported to another facility?    If hospital to hospital transfer, describe services needed at 2nd facility not available at 1st facility:    If hospice Pt, is this transport related to Pt's  terminal illness?  Yes    []   No   Describe:       SECTION II - MEDICAL NECESSITY QUESTIONNAIRE   Ambulance transportation is medically necessary only if other means of transport are contraindicated or would be potentially  Harmful to the patient. To meet this requirement, the patient must be either bed confined or suffer from condition such    that transport by means other than an ambulance is contraindicated by the patient's condition.The following questions    must be addressed by the healthcare professional signing below for this form to be valid:      1) Describe the MEDICAL CONDITION space (physical and/or mental) of this patient AT THE TIME OF AMBULANCE TRANSPORT        that requires the patient to be transported in an ambulance, and why transport in an ambulance, and why transport by        other means is contraindicated by the patient's condition:       2) Is this patient bed confined as defined below?  [x]   Yes    []  No            To be bed confined the patient must satisfy all 3 of the following criteria:            (1) unable to get up from bed without assistance; AND            (2) unable to ambulate; AND            (3) unable to sit in a chair or wheelchair.      3) Can this patient safely be transported by car or wheelchair van (I.e., may safely sit during transport,        without an attendant or monitoring?)      []  Yes     [x]  No      4) In addition to completing questions 1-3 above, please check any of the following conditions that apply*:        *Note: Supporting documentation for any boxes checked must be maintained in the patient's medical records      [] Contractures    [] Non-healed fractures    [] Patient is confused     [] Patient is comatose     [x] Moderate/severe pain on movement    [] Danger to self/others    [] IV meds/fluids required   [] PIV:   []  Yes    []   No   [] Patient is combative  [] Need, or possible need, for restraints    [] DVT requires elevation of a lower extremity     [] Medical attendant required    [x] Requires oxygen - unable to self-administer    [] Special handling/isolation/infection control precautions  required   [x] Unable to tolerate seated position for time needed  to transport    [] Hemodynamic monitoring required enroute     [] Unable to sit in a chair or wheelchair due to decubitus  ulcers or other wounds    [] Cardiac monitoring required enroute    [] Morbid obesity requires additional personnel/equipment  to safely handle patient     [] Orthopedic device (backboard, halo, pins, traction,  brace, wedge, etc.) requiring special handling during transport    [x] Other (specificy) non ambulatory       SECTION III - SIGNATURE OF PHYSICIAN OR OTHER AUTHORIZED HEALTHCARE PROFESSIONAL   I certify that the above information is accurate based on my evaluation of this patient, and that the medical necessity provisions of 42 CFR 410.40 (e)(1) are met, requiring that this patient be transported  by ambulance.  I understand this information will be used by the centers for Medicare and Medicaid Services (CMS) to support the determination of medical necessity for ambulance services.  I represent that I am the  beneficiary's attending physician; or  an employee of the beneficiary's attending physician, or the hospital or facility where the beneficiary is being treated and from which the beneficiary is being transported; that I have personal knowledge of the beneficiary's condition at the time of  transport; and that I meet all Medicare regulations and applicable state licensure laws for the credentialed indicated.      [x] If this box  is checked, I also certify that the patient is physically or mentally incapable of signing the ambulance services claim form and that the institution with which I am affiliated has furnished care, services or assistance to the patient.  My signature  below was made on behalf of the patient pursuant to 46 RQM575.63(a)(5).  In accordance with  42 [id number], the  specific reason(s) that the patient is physically or mentally incapable of signing the claim form is as follows.        PATIENT DATA:     ED Attending: Cinderella Manila, MD       Visit Vitals      BP  120/83 (BP 1 Location: Right upper arm)     Pulse  72     Temp  99.1 F (37.3 C)     Resp  18     Ht  5' 7 (1.702 m)     Wt  103.1 kg (227 lb 4.8 oz)     SpO2  92%        BMI  35.60 kg/m              Past Medical History:        Diagnosis  Date         ?  Arthritis       ?  Chronic pain syndrome            related to R hip replacement         ?  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  DM2 (diabetes mellitus, type 2) (HCC)       ?  GERD (gastroesophageal reflux disease)       ?  Hepatitis C            HEP C         ?  HTN (hypertension)       ?  Lung nodule, multiple            (CT 10/2016) Small left upper lobe and 1.7 x 1.6 cm left lower lobe nodules not significantly changed from 07/23/2016 and 03/22/2016         ?  Marijuana use       ?  PTSD (post-traumatic stress disorder)            lived through the [place]         ?  Thoracic ascending aortic aneurysm (HCC)            4.4cm noted on CT Chest (10/2016)         ?  Thyroid nodule            2.5 cm stable right thyroid nodule (CT 10/2016)          Medications          Medications       allopurinoL  (ZYLOPRIM ) tablet 100 mg (100 mg Oral  Given 01/28/21 0842)       montelukast  (SINGULAIR ) tablet 10 mg (10 mg Oral Given 01/28/21 0842)     rosuvastatin  (CRESTOR ) tablet 5 mg (5 mg Oral Given 01/27/21 2150)     naloxone  (NARCAN ) injection 0.1 mg (has no administration in time range)     acetaminophen  (TYLENOL ) tablet 650 mg (650 mg Oral Given 01/28/21 0532)       Or     acetaminophen  (TYLENOL ) solution 650 mg ( Oral See Alternative 01/28/21 0532)       Or     acetaminophen  (TYLENOL ) suppository 650 mg ( Rectal See Alternative 01/28/21 0532)     ondansetron  (ZOFRAN ) injection 4 mg (4 mg  IntraVENous Given 01/28/21 0532)     apixaban  (ELIQUIS ) tablet 10 mg (10 mg Oral Given 01/28/21 0842)     apixaban  (ELIQUIS ) tablet 5 mg (has no administration in time range)     dextrose  (D50W) injection syrg 5-25 g (has no administration in time range)     glucagon  (GLUCAGEN) injection 1 mg (has no administration in time range)     insulin  glargine (LANTUS ) injection 1-100 Units (23 Units SubCUTAneous Given 01/27/21 2332)     insulin  lispro (HUMALOG ) injection 1-100 Units (6 Units SubCUTAneous Given 01/28/21 1356)     insulin  lispro (HUMALOG ) injection 1-100 Units (1 Units SubCUTAneous Given 01/27/21 2332)     amLODIPine  (NORVASC ) tablet 5 mg (5 mg Oral Given 01/28/21 0842)     cloNIDine HCL (CATAPRES) tablet 0.2 mg (0.2 mg Oral Given 01/28/21 1356)     tiotropium bromide (SPIRIVA RESPIMAT) 2.5 mcg /actuation (2 Puffs Inhalation Refused 01/28/21 0900)     losartan  (COZAAR ) tablet 100 mg (100 mg Oral Given 01/28/21 0842)     senna-docusate (PERICOLACE) 8.6-50 mg per tablet 2 Tablet (2 Tablets Oral Given 01/27/21 2151)     polyethylene glycol (MIRALAX ) packet 17 g (17 g Oral Given 01/28/21 0842)     QUEtiapine  (SEROquel ) tablet 100 mg (100 mg Oral Given 01/27/21 2151)     spironolactone  (ALDACTONE ) tablet 25 mg (25 mg Oral Given 01/28/21 0842)     predniSONE (DELTASONE) tablet 40 mg (40 mg Oral Given 01/28/21 0842)     sodium chloride  (NS) flush 5-10 mL (10 mL IntraVENous Given 01/24/21 0833)     furosemide  (LASIX ) injection 40 mg (40 mg IntraVENous Given 01/24/21 0833)     acetaminophen  (TYLENOL ) tablet 1,000 mg (1,000 mg Oral Given 01/24/21 0858)       apixaban  (ELIQUIS ) tablet 10 mg (10 mg Oral Given 01/24/21 1258)       polyethylene glycol (MIRALAX ) packet 17 g (17 g Oral Given 01/26/21 0659)             Allergies          Allergies        Allergen  Reactions         ?  Chocolate [Cocoa]  Sneezing             Confirms allergy but reports I still eat it             ED Course/Discharge                   ICD-10-CM  ICD-9-CM           1.  Acute hypoxemic respiratory failure (HCC)   J96.01  518.81     2.  Acute congestive heart failure, unspecified heart failure type (HCC)   I50.9  428.0          3.  Arm DVT (deep venous thromboembolism), acute, left (HCC)   I82.622  453.82        Home or Self Care      Current Discharge Medication List                 START taking these medications          Details        predniSONE (STERAPRED) 5 mg dose pack  See administration instruction per 5mg  dose pack   Qty: 21 Tablet, Refills: 0   Start date: 01/28/2021               senna-docusate (PERICOLACE) 8.6-50 mg per tablet  Take 2 Tablets by mouth nightly.   Qty: 100 Tablet, Refills: 0   Start date: 01/28/2021               apixaban  (Eliquis  DVT-PE Treat 30D Start) 5 mg (74 tabs) starter pack  Take 10 mg (two 5 mg tablets) by mouth twice a day for 2 days till 8/20   Followed by 5 mg (one 5 mg tablet) by mouth twice a day starting 8/21   Qty: 1 Dose Pack, Refills: 0   Start date: 01/28/2021                        CONTINUE these medications which have CHANGED          Details        insulin  aspart U-100 (NovoLOG  U-100 Insulin  aspart) 100 unit/mL injection  7 Units by SubCUTAneous route Before breakfast, lunch, and dinner.   Qty: 10 mL, Refills: 0   Start date: 01/28/2021               insulin  glargine (Lantus  Solostar U-100 Insulin ) 100 unit/mL (3 mL) inpn  25 Units by SubCUTAneous route nightly.   Qty: 1 Pen, Refills: 0   Start date: 01/28/2021               losartan  (COZAAR ) 100 mg tablet  Take 1 Tablet by mouth daily.   Qty: 30 Tablet, Refills: 0   Start date: 01/28/2021               cloNIDine HCL (CATAPRES) 0.2 mg tablet  Take 1 Tablet by mouth two (2) times a day.   Qty: 60 Tablet, Refills: 0   Start date: 01/28/2021                        CONTINUE these medications which have NOT CHANGED          Details        allopurinoL  (ZYLOPRIM ) 100 mg tablet  Take 1 Tablet by mouth two (2) times a day.   Qty: 180 Tablet, Refills: 1               polyethylene glycol (MIRALAX )  17 gram packet  Take 1 Packet by mouth daily as needed for Constipation. Indications: constipation   Qty: 30 Packet, Refills: 1               fluticasone  propionate (FLONASE) 50 mcg/actuation nasal spray  2 Sprays by Both Nostrils route daily as needed for Rhinitis or Allergies. Indications: inflammation of the nose due to an allergy   Qty: 1 Each, Refills: 0  butalbital-acetaminophen -caffeine (FIORICET, ESGIC) 50-325-40 mg per tablet  butalbital-acetaminophen -caffeine 50 mg-325 mg-40 mg tablet    TAKE 1 TABLET BY MOUTH EVERY 4 HOURS AS NEEDED FOR HEADACHE               potassium chloride  SR (K-TAB ) 20 mEq tablet  potassium chloride  ER 20 mEq tablet,extended release    TAKE 1 TABLET BY MOUTH ONCE DAILY               acetaminophen  (TYLENOL ) 325 mg tablet  Take 2 Tabs by mouth every four (4) hours as needed for Pain.   Qty: 20 Tab, Refills: 0               albuterol  (PROVENTIL  HFA, VENTOLIN  HFA, PROAIR  HFA) 90 mcg/actuation inhaler  Take 2 Puffs by inhalation every four (4) hours as needed for Wheezing.   Qty: 1 Inhaler, Refills: 1               dilTIAZem CD (CARDIZEM CD) 240 mg ER capsule  Take 1 Cap by mouth daily.   Qty: 30 Cap, Refills: 1               cetirizine (ZYRTEC) 10 mg tablet  Take 1 Tab by mouth daily.   Qty: 30 Tab, Refills: 1               albuterol  (ACCUNEB ) 1.25 mg/3 mL nebu  USE 1 VIAL IN NEBULIZER EVERY 6 HOURS AS NEEDED FOR  SHORTNESS  OF  BREATH,  WHEEZING   Qty: 270 mL, Refills: 1               metFORMIN ER (GLUCOPHAGE XR) 500 mg tablet  TAKE 1 TABLET TWICE DAILY   Qty: 180 Tab, Refills: 1               montelukast  (SINGULAIR ) 10 mg tablet  TAKE 1 TABLET EVERY DAY   Qty: 90 Tab, Refills: 3               magnesium  oxide (MAG-OX) 400 mg tablet  Take 400 mg by mouth daily.               QUEtiapine  SR (SEROQUEL  XR) 150 mg sr tablet  Take 150 mg by mouth nightly as needed.               tiotropium bromide (SPIRIVA RESPIMAT) 2.5 mcg/actuation inhaler  Take 1 Puff by inhalation daily.    Qty: 1 Inhaler, Refills: 0          Associated Diagnoses: COPD with acute exacerbation (HCC)               fluticasone  propion-salmeterol (ADVAIR DISKUS) 100-50 mcg/dose diskus inhaler  Take 1 Puff by inhalation two (2) times a day.   Qty: 1 Inhaler, Refills: 0               rosuvastatin  (CRESTOR ) 5 mg tablet  TAKE 1 TABLET BY MOUTH NIGHTLY   Qty: 90 Tab, Refills: 1          Associated Diagnoses: Type 2 diabetes mellitus with diabetic neuropathy, without long-term current use  of insulin  (HCC)                        STOP taking these medications                  furosemide  (LASIX ) 20 mg tablet  Comments:    Reason for  Stopping:                      predniSONE (DELTASONE) 10 mg tablet  Comments:    Reason for Stopping:                                  Electronically signed by:   Karna Scull   January 28, 2021   2:43 PM         Name: __________________________________________________________________   Printed Name and Credentials Authorized Healthcare Professional (MD, DO, RN, etc)   *Form must be signed only by patient's attending physician for scheduled, repetitive transports.   For non-repetitive ambulance transports, if unable to obtain the signature of the attending physician,   Any of the following may sign (please check appropriate box below):         [] Physician Assistant   [] Clinical Nurse Specialist  [x]  Case Manager  [] Social Worker    [] Nurse Practitioner     [] Registered Nurse             []  Discharge Planner         Dispatch Phone: [phone]   Dispatch Fax:     [phone]

## 2021-01-28 NOTE — Discharge Summary (Signed)
Discharge Summary    Patient: [name] [name]               Sex: female          DOA: 01/24/2021         Date of Birth:  05-16-1951      Age:  70 y.o.        LOS:  LOS: 4 days                Admit Date: 01/24/2021    Discharge Date: 01/28/2021    Admission Diagnoses:   Discharge Diagnoses:   1.  Acute on chronic hypoxic and hypercapnic respiratory failure  2.  COPD with acute exacerbation  3.  Acute DVT L arm  4.  Hypertensive urgency  5.  DM type II controlled  6.  Hypokalemia  7.  CHF chronic diastolic  8.  Obesity with possible OSA  9.  Metabolic alkalosis  10.  Constipation    Current Discharge Medication List        START taking these medications    Details   predniSONE (STERAPRED) 5 mg dose pack See administration instruction per  dose pack  Qty: 21 Tablet, Refills: 0      senna-docusate (PERICOLACE) 8.6-50 mg per tablet Take 2 Tablets by mouth nightly.  Qty: 100 Tablet, Refills: 0      apixaban (Eliquis DVT-PE Treat 30D Start) 5 mg (74 tabs) starter pack Take 10 mg (two 5 mg tablets) by mouth twice a day for 2 days till 8/20  Followed by 5 mg (one 5 mg tablet) by mouth twice a day starting 8/21  Qty: 1 Dose Pack, Refills: 0           CONTINUE these medications which have CHANGED    Details   insulin aspart U-100 (NovoLOG U-100 Insulin aspart) 100 unit/mL injection 7 Units by SubCUTAneous route Before breakfast, lunch, and dinner.  Qty: 10 mL, Refills: 0      insulin glargine (Lantus Solostar U-100 Insulin) 100 unit/mL (3 mL) inpn 25 Units by SubCUTAneous route nightly.  Qty: 1 Pen, Refills: 0      losartan (COZAAR) 100 mg tablet Take 1 Tablet by mouth daily.  Qty: 30 Tablet, Refills: 0      cloNIDine HCL (CATAPRES) 0.2 mg tablet Take 1 Tablet by mouth two (2) times a day.  Qty: 60 Tablet, Refills: 0           CONTINUE these medications which have NOT CHANGED    Details   allopurinoL (ZYLOPRIM) 100 mg tablet Take 1 Tablet by mouth two (2) times a day.  Qty: 180 Tablet, Refills: 1      polyethylene glycol  (MIRALAX) 17 gram packet Take 1 Packet by mouth daily as needed for Constipation. Indications: constipation  Qty: 30 Packet, Refills: 1      fluticasone propionate (FLONASE) 50 mcg/actuation nasal spray 2 Sprays by Both Nostrils route daily as needed for Rhinitis or Allergies. Indications: inflammation of the nose due to an allergy  Qty: 1 Each, Refills: 0      butalbital-acetaminophen-caffeine (FIORICET, ESGIC) 50-325-40 mg per tablet butalbital-acetaminophen-caffeine 50 mg-325 mg-40 mg tablet   TAKE 1 TABLET BY MOUTH EVERY 4 HOURS AS NEEDED FOR HEADACHE      potassium chloride SR (K-TAB) 20 mEq tablet potassium chloride ER 20 mEq tablet,extended release   TAKE 1 TABLET BY MOUTH ONCE DAILY      acetaminophen (TYLENOL) 325 mg tablet Take  2 Tabs by mouth every four (4) hours as needed for Pain.  Qty: 20 Tab, Refills: 0      albuterol (PROVENTIL HFA, VENTOLIN HFA, PROAIR HFA) 90 mcg/actuation inhaler Take 2 Puffs by inhalation every four (4) hours as needed for Wheezing.  Qty: 1 Inhaler, Refills: 1      dilTIAZem CD (CARDIZEM CD) 240 mg ER capsule Take 1 Cap by mouth daily.  Qty: 30 Cap, Refills: 1      cetirizine (ZYRTEC) 10 mg tablet Take 1 Tab by mouth daily.  Qty: 30 Tab, Refills: 1      albuterol (ACCUNEB) 1.25 mg/3 mL nebu USE 1 VIAL IN NEBULIZER EVERY 6 HOURS AS NEEDED FOR  SHORTNESS  OF  BREATH,  WHEEZING  Qty: 270 mL, Refills: 1      metFORMIN ER (GLUCOPHAGE XR) 500 mg tablet TAKE 1 TABLET TWICE DAILY  Qty: 180 Tab, Refills: 1      montelukast (SINGULAIR) 10 mg tablet TAKE 1 TABLET EVERY DAY  Qty: 90 Tab, Refills: 3      magnesium oxide (MAG-OX) 400 mg tablet Take 400 mg by mouth daily.      QUEtiapine SR (SEROQUEL XR) 150 mg sr tablet Take 150 mg by mouth nightly as needed.      tiotropium bromide (SPIRIVA RESPIMAT) 2.5 mcg/actuation inhaler Take 1 Puff by inhalation daily.  Qty: 1 Inhaler, Refills: 0    Associated Diagnoses: COPD with acute exacerbation (HCC)      fluticasone propion-salmeterol (ADVAIR  DISKUS) 100-50 mcg/dose diskus inhaler Take 1 Puff by inhalation two (2) times a day.  Qty: 1 Inhaler, Refills: 0      rosuvastatin (CRESTOR) 5 mg tablet TAKE 1 TABLET BY MOUTH NIGHTLY  Qty: 90 Tab, Refills: 1    Associated Diagnoses: Type 2 diabetes mellitus with diabetic neuropathy, without long-term current use of insulin (HCC)           STOP taking these medications       furosemide (LASIX) 20 mg tablet Comments: Due to hypokalemia  Reason for Stopping:         predniSONE (DELTASONE) 10 mg tablet Comments: Started on Dosepak  Reason for Stopping:             Discharge Medications:     Current Discharge Medication List        START taking these medications    Details   predniSONE (STERAPRED) 5 mg dose pack See administration instruction per 5mg  dose pack  Qty: 21 Tablet, Refills: 0  Start date: 01/28/2021      senna-docusate (PERICOLACE) 8.6-50 mg per tablet Take 2 Tablets by mouth nightly.  Qty: 100 Tablet, Refills: 0  Start date: 01/28/2021      apixaban (Eliquis DVT-PE Treat 30D Start) 5 mg (74 tabs) starter pack Take 10 mg (two 5 mg tablets) by mouth twice a day for 2 days till 8/20  Followed by 5 mg (one 5 mg tablet) by mouth twice a day starting 8/21  Qty: 1 Dose Pack, Refills: 0  Start date: 01/28/2021           CONTINUE these medications which have CHANGED    Details   insulin aspart U-100 (NovoLOG U-100 Insulin aspart) 100 unit/mL injection 7 Units by SubCUTAneous route Before breakfast, lunch, and dinner.  Qty: 10 mL, Refills: 0  Start date: 01/28/2021      insulin glargine (Lantus Solostar U-100 Insulin) 100 unit/mL (3 mL) inpn 25 Units by SubCUTAneous route nightly.  Qty: 1 Pen, Refills: 0  Start date: 01/28/2021      losartan (COZAAR) 100 mg tablet Take 1 Tablet by mouth daily.  Qty: 30 Tablet, Refills: 0  Start date: 01/28/2021      cloNIDine HCL (CATAPRES) 0.2 mg tablet Take 1 Tablet by mouth two (2) times a day.  Qty: 60 Tablet, Refills: 0  Start date: 01/28/2021           CONTINUE these medications  which have NOT CHANGED    Details   allopurinoL (ZYLOPRIM) 100 mg tablet Take 1 Tablet by mouth two (2) times a day.  Qty: 180 Tablet, Refills: 1      polyethylene glycol (MIRALAX) 17 gram packet Take 1 Packet by mouth daily as needed for Constipation. Indications: constipation  Qty: 30 Packet, Refills: 1      fluticasone propionate (FLONASE) 50 mcg/actuation nasal spray 2 Sprays by Both Nostrils route daily as needed for Rhinitis or Allergies. Indications: inflammation of the nose due to an allergy  Qty: 1 Each, Refills: 0      butalbital-acetaminophen-caffeine (FIORICET, ESGIC) 50-325-40 mg per tablet butalbital-acetaminophen-caffeine 50 mg-325 mg-40 mg tablet   TAKE 1 TABLET BY MOUTH EVERY 4 HOURS AS NEEDED FOR HEADACHE      potassium chloride SR (K-TAB) 20 mEq tablet potassium chloride ER 20 mEq tablet,extended release   TAKE 1 TABLET BY MOUTH ONCE DAILY      acetaminophen (TYLENOL) 325 mg tablet Take 2 Tabs by mouth every four (4) hours as needed for Pain.  Qty: 20 Tab, Refills: 0      albuterol (PROVENTIL HFA, VENTOLIN HFA, PROAIR HFA) 90 mcg/actuation inhaler Take 2 Puffs by inhalation every four (4) hours as needed for Wheezing.  Qty: 1 Inhaler, Refills: 1      dilTIAZem CD (CARDIZEM CD) 240 mg ER capsule Take 1 Cap by mouth daily.  Qty: 30 Cap, Refills: 1      cetirizine (ZYRTEC) 10 mg tablet Take 1 Tab by mouth daily.  Qty: 30 Tab, Refills: 1      albuterol (ACCUNEB) 1.25 mg/3 mL nebu USE 1 VIAL IN NEBULIZER EVERY 6 HOURS AS NEEDED FOR  SHORTNESS  OF  BREATH,  WHEEZING  Qty: 270 mL, Refills: 1      metFORMIN ER (GLUCOPHAGE XR) 500 mg tablet TAKE 1 TABLET TWICE DAILY  Qty: 180 Tab, Refills: 1      montelukast (SINGULAIR) 10 mg tablet TAKE 1 TABLET EVERY DAY  Qty: 90 Tab, Refills: 3      magnesium oxide (MAG-OX) 400 mg tablet Take 400 mg by mouth daily.      QUEtiapine SR (SEROQUEL XR) 150 mg sr tablet Take 150 mg by mouth nightly as needed.      tiotropium bromide (SPIRIVA RESPIMAT) 2.5 mcg/actuation  inhaler Take 1 Puff by inhalation daily.  Qty: 1 Inhaler, Refills: 0    Associated Diagnoses: COPD with acute exacerbation (HCC)      fluticasone propion-salmeterol (ADVAIR DISKUS) 100-50 mcg/dose diskus inhaler Take 1 Puff by inhalation two (2) times a day.  Qty: 1 Inhaler, Refills: 0      rosuvastatin (CRESTOR) 5 mg tablet TAKE 1 TABLET BY MOUTH NIGHTLY  Qty: 90 Tab, Refills: 1    Associated Diagnoses: Type 2 diabetes mellitus with diabetic neuropathy, without long-term current use of insulin (HCC)           STOP taking these medications       furosemide (LASIX) 20 mg tablet Comments:  Reason for Stopping:         predniSONE (DELTASONE) 10 mg tablet Comments:   Reason for Stopping:               Follow-up: PCP in a week  Pulmonary as scheduled  Discharge Condition: Stable    Activity: As tolerated    Diet: Cardiac and diabetic    Labs:  Labs: Results:   Chemistry Recent Labs     01/27/21  0537 01/25/21  1524   GLU 169*  --    NA 136  --    K 4.4  --    CL 96*  --    CO2 35* 40.0*   BUN 19  --    CREA 0.69  --    CA 9.5  --    AGAP 5  --        CBC w/Diff No results found for this visit on 01/24/21 (from the past 12 hour(s)).   Cardiac Enzymes Lab Results   Component Value Date/Time    CK 165 09/08/2017 09:30 AM    CK - MB 0.7 09/08/2017 09:30 AM    CK-MB Index 0.4 09/08/2017 09:30 AM    Troponin-I <0.015 08/22/2019 01:04 PM      Coagulation Lab Results   Component Value Date/Time    INR 0.9 05/26/2020 08:18 PM    INR 1.0 08/28/2018 01:20 PM    INR 1.1 09/08/2017 09:30 AM    Prothrombin time 11.0 05/26/2020 08:18 PM    Prothrombin time 11.7 08/28/2018 01:20 PM    Prothrombin time 13.2 (H) 09/08/2017 09:30 AM      Lipid Panel Lab Results   Component Value Date/Time    Cholesterol, total 119 (L) 06/30/2020 02:08 PM    HDL Cholesterol 39 (L) 06/30/2020 02:08 PM    LDL, calculated 39 06/30/2020 02:08 PM    VLDL, calculated 31.8 06/10/2016 01:00 PM    Triglyceride 203 (H) 06/30/2020 02:08 PM    CHOL/HDL Ratio 3.1  06/30/2020 53:66 PM      BNP Lab Results   Component Value Date/Time    NT pro-BNP 27 01/24/2021 08:20 AM    NT pro-BNP 9.0 05/26/2020 11:20 AM    NT pro-BNP 644.0 (H) 08/22/2019 01:04 PM    NT pro-BNP 11.0 06/01/2019 11:50 AM    NT pro-BNP 18.0 08/22/2018 02:10 PM      Liver Enzymes No results for input(s): TP, ALB, TBIL, AP in the last 72 hours.    No lab exists for component: SGOT, GPT, DBIL   Vitamins Lab Results   Component Value Date/Time    Vitamin D, 25-OH, Total 62.2 03/07/2019 03:10 PM       Lab Results   Component Value Date/Time    Vitamin B12 1,721 (H) 12/16/2014 02:40 PM      Thyroid Studies Lab Results   Component Value Date/Time    TSH 0.170 (L) 06/02/2019 12:43 PM          Imaging/Procedures:   No results found.  CT Results (most recent):  Results from Hospital Encounter encounter on 06/01/19    CT ABD PELV W CONT    Narrative  EXAM: CT ABD PELV W CONT    INDICATION: LLQ abd pain    TECHNIQUE: Axial CT scan of the abdomen and pelvis with   IV contrast  including coronal and sagittal reconstructions.  DICOM format image data is available to non-affiliated external healthcare  facilities or entities on a secure,  media free, reciprocally searchable basis  with patient authorization for 12 months following the date of the study.      COMPARISON: 05/07/2019    FINDINGS:  Lower thorax: Bibasilar bullae.    ABDOMEN:  Liver: Unremarkable. No mass.  Gallbladder and bile ducts: Unremarkable. No calcified stones. No ductal  dilatation.  Pancreas: Unremarkable. No ductal dilatation. No mass.  Spleen: Multiple unchanged splenic lesions.  Adrenals: Unremarkable. No mass.  Kidneys and ureters: 2 mm nonobstructing right kidney stone.  Appendix: No findings to suggest appendicitis.  Stomach and bowel: Stomach unremarkable. No obstruction. No mucosal thickening.  No inflammatory changes. Multiple sigmoid diverticula without diverticulitis.  Peritoneum: Unremarkable. No significant fluid collection. No free  air.  Lymph nodes: Unremarkable. No enlarged lymph nodes.  Vasculature: Unremarkable. No aortic aneurysm.  Bones: Total right hip replacement.  Abdominal wall: Unremarkable. No evidence of a hernia.    PELVIS:  Bladder: Unremarkable. No mass.  Reproductive: Hysterectomy.    Impression  IMPRESSION:    1. Multiple sigmoid diverticula without diverticulitis.  2. 2 mm nonobstructing right kidney stone.  3. Total right hip replacement.  4. Hysterectomy.    MRI Results (most recent):  Results from Orders Only encounter on 10/17/19    MRI LUMB SPINE W CONT    XR Results (most recent):  Results from Hospital Encounter encounter on 01/24/21    XR CHEST SNGL V    Narrative  XR CHEST SNGL V    Clinical indication: sob.    Comparison: 01/24/2021    Support devices: None    Lungs: Stable exam demonstrating lucency overlying the left upper lung field,  compatible with chronic bullous changes. Unchanged rounded density overlying the  left midlung field. Remainder the lungs are unchanged.    Cardiomediastinal: Normal morphology for radiographic technique.    Bones/soft tissues: No acute abnormalities.    Impression  IMPRESSION:    Stable exam.    [place] Results (most recent):  Results from Hospital Encounter encounter on 12/24/17    [place] RUQ    Narrative  Examination: Ultrasound right upper quadrant.    INDICATION: Pain.    FINDINGS:    The liver appears grossly unremarkable. No focal hepatic lesions definitely  identified.    The gallbladder appears unremarkable. No gallbladder wall thickening. No  sonographic Murphy's sign. CBD within normal limits roughly measuring 0.3 cm.    Right kidney appears unremarkable and is normal in size. A 4 mm nonobstructing  renal calculus. Tiny cyst on previous CT dated 08/27/2016, not well visualized on  the current exam.    Proximal aorta and IVC appear unremarkable.    Impression  IMPRESSION:  1. No acute abnormalities.  2. No gallbladder wall thickening, gallstones or evidence of  acute  cholecystitis.  3. Nonobstructing right renal calculus.    Results for orders placed or performed during the hospital encounter of 05/26/20   EKG, 12 LEAD, INITIAL   Result Value Ref Range    Ventricular Rate 100 BPM    Atrial Rate 100 BPM    P-R Interval 162 ms    QRS Duration 68 ms    Q-T Interval 352 ms    QTC Calculation (Bezet) 454 ms    Calculated P Axis 76 degrees    Calculated R Axis 30 degrees    Calculated T Axis 60 degrees    Diagnosis       Normal sinus rhythm  Poor R wave progression consistent with possible anterior wall MI of  indeterminate age vs. lead placement variation  Abnormal ECG  When compared with ECG of 26-May-2020 11:06,  Sinus rhythm has replaced Atrial fibrillation  Confirmed by Valentina Lucks, M.D., John ([id number]) on 05/26/2020 3:41:58 PM       EKG Results       Procedure 720 Value Units Date/Time    EKG, 12 LEAD, INITIAL [[id number]]     Order Status: Sent                 Consults: PT/OT, pulmonary    Treatment Team: Treatment Team: Attending Provider: [name], MD; Consulting Provider: Zoe Lan, MD; Consulting Provider: Roderic Scarce, MD; Consulting Provider: [name], MD; Nurse Navigator: Otho Najjar; Staff Nurse: Geryl Councilman, RN; Care Manager: Angela Adam    Significant Diagnostic Studies:   Hospital Course:   Patient presented with a chief complaint of shortness of breath and left arm swelling.  Patient was diagnosed with acute on chronic hypoxic and hypercapnic respiratory failure and left arm DVT.  Patient was admitted to stepdown she was started on BiPAP.  For acute DVT patient was started on Eliquis.  Pulmonary consult was done.  Patient responded well to the treatment.  Patient's condition started improving.  Patient was taken off of BiPAP she was started on supplemental oxygen.  Steroids were changed to p.o.  Patient was continued on frequent breathing treatments.  Patient was subsequently transferred to floor.  PT/OT consult was done.  On the day of  discharge patient was seen and examined she was feeling better she was able to ambulate shortness of breath that improved and she requested to go home.  Physical examination on the day of discharge showed very pleasant elderly female sitting comfortably vitals stable.  Pertinent labs hemoglobin 13 WBC improved to 12,000  Potassium 4.4 creatinine 0.6.  Patient being discharged home with follow-up appointment with her primary care provider and pulmonary as scheduled.  Patient was advised to take her medicine as directed.  Patient was told if her condition gets worse she starts running fever or she is extremely short of breath or she is unable to keep anything down she can call her primary care provider or come to the emergency room.  Copy of the discharge summary will be sent to patient's primary care provider    Dragon medical dictation software was used for portions of this report. Unintended errors may occur.      [name], MD  January 28, 2021        Total time spent: Over 30 min.

## 2021-01-28 NOTE — Progress Notes (Signed)
Formatting of this note might be different from the original.    Problem: Falls - Risk of  Goal: *Absence of Falls  Description: Document [name] Fall Risk and appropriate interventions in the flowsheet.  Outcome: Progressing Towards Goal  Note: Fall Risk Interventions:  Mobility Interventions: Assess mobility with egress test, Bed/chair exit alarm        Medication Interventions: Assess postural VS orthostatic hypotension, Bed/chair exit alarm    Elimination Interventions: Bed/chair exit alarm, Call light in reach          Problem: Patient Education: Go to Patient Education Activity  Goal: Patient/Family Education  Outcome: Progressing Towards Goal    Problem: Pressure Injury - Risk of  Goal: *Prevention of pressure injury  Description: Document Braden Scale and appropriate interventions in the flowsheet.  Outcome: Progressing Towards Goal  Note: Pressure Injury Interventions:  Sensory Interventions: Assess need for specialty bed, Assess changes in LOC    Moisture Interventions: Absorbent underpads, Apply protective barrier, creams and emollients    Activity Interventions: Assess need for specialty bed, Pressure redistribution bed/mattress(bed type)    Mobility Interventions: HOB 30 degrees or less, Pressure redistribution bed/mattress (bed type)    Nutrition Interventions: Document food/fluid/supplement intake    Friction and Shear Interventions: Apply protective barrier, creams and emollients, HOB 30 degrees or less          Problem: Patient Education: Go to Patient Education Activity  Goal: Patient/Family Education  Outcome: Progressing Towards Goal    Electronically signed by Sherlyn Lees, RN at 01/28/2021  1:10 AM EDT

## 2021-01-28 NOTE — Progress Notes (Signed)
Formatting of this note might be different from the original.  Transport to: Home Address  Reason for transport: O2 @ 2L due to COPD with acute exacerbation  Transport set up with: [facility]  Time/Date: 01/28/21@ 1500  D/C Summary loaded: pending  Nurse/CM notified: yes  Envelope delivered: yes  Insurance verified on face sheet: yes  Auth needed: yes  Auth #:      Electronically signed by Nira Conn E at 01/28/2021  1:43 PM EDT

## 2021-01-28 NOTE — Progress Notes (Signed)
Transport to: Home Address  Reason for transport: O2 @ 2L due to COPD with acute exacerbation  Transport set up with: [facility]  Time/Date: 01/28/21@ 1500  D/C Summary loaded: pending  Nurse/CM notified: yes  Envelope delivered: yes  Insurance verified on face sheet: yes  Auth needed: yes  Auth #:

## 2021-01-28 NOTE — Progress Notes (Signed)
Formatting of this note is different from the original.  Images from the original note were not included.      [facility]     Name: [name] [name] MRN: [id number]   DOB: 1950/12/05 Hospital: [facility]   Date: 01/28/2021        IMPRESSION:   Shortness of breath, shortness of breath, and wheezing due to COPD with exacerbation  ABG demonstrated primary metabolic alkalosis, and secondary respiratory acidosis  Asthma/COPD with exacerbation  Venous duplex of upper extremity obtained on 01/24/2021 demonstrated acute deep venous thrombosis of left upper extremity with occlusive thrombus in subclavian vein  GERD  PTSD     PLAN:   Continue BiPAP on regular basis during nighttime, and as needed basis during daytime.  Titrate supplemental oxygen through nasal cannula, and target oxygen saturation greater than 88%  Continue p.o. prednisone, taper slowly   Continue Spiriva 2.5 mcg 2 inhalations a day, and  Breo 100/25 mcg 1 inhalation  a day (patient is on Advair 100/50 twice a day).  Continue DuoNeb through nebulizer every 6 hours.  Avoid excessive diuresis in light of ABG demonstrating metabolic alkalosis, this may deteriorate PCO2 retention  DVT prophylaxis: On Eliquis for acute DVT     Subjective/Interval History:     Review of Systems   Unable to perform ROS: Other     Objective:   Vital Signs:    Visit Vitals  BP 129/86 (BP Patient Position: Supine)   Pulse 71   Temp 98.2 F (36.8 C)   Resp 18   Ht 5\' 7"  (1.702 m)   Wt 103.1 kg (227 lb 4.8 oz)   SpO2 95%   BMI 35.60 kg/m     O2 Device: Nasal cannula   O2 Flow Rate (L/min): 2 l/min   Temp (24hrs), Avg:98 F (36.7 C), Min:97.3 F (36.3 C), Max:98.8 F (37.1 C)      Intake/Output:   Last shift:      No intake/output data recorded.  Last 3 shifts: 08/16 1901 - 08/18 0700  In: 480 [P.O.:480]  Out: 401 [Urine:400]    Intake/Output Summary (Last 24 hours) at 01/28/2021 0954  Last data filed at 01/27/2021 1456  Gross per 24  hour   Intake --   Output 1 ml   Net -1 ml         Physical Exam  Vitals and nursing note reviewed.   HENT:      Mouth/Throat:      Mouth: Mucous membranes are moist.   Eyes:      Pupils: Pupils are equal, round, and reactive to light.   Cardiovascular:      Rate and Rhythm: Regular rhythm.   Pulmonary:      Breath sounds: No wheezing.   Abdominal:      General: Bowel sounds are normal.      Palpations: Abdomen is soft.   Musculoskeletal:         General: No swelling.   Skin:     General: Skin is warm and dry.   Neurological:      General: No focal deficit present.      Mental Status: She is alert and oriented to person, place, and time.       DATA:  Labs:  No results for input(s): WBC, HGB, HCT, PLT, HGBEXT, HCTEXT, PLTEXT, HGBEXT, HCTEXT, PLTEXT in the last 72 hours.    Recent Labs     01/27/21  3846 01/25/21  1524   NA 136  --    K 4.4  --    CL 96*  --    CO2 35* 40.0*   GLU 169*  --    BUN 19  --    CREA 0.69  --    CA 9.5  --      Recent Labs     01/25/21  1524   PH 7.420   PCO2 58.9*   PO2 82.0   HCO3 38.2*   FIO2 35     All Micro Results       None           Imaging:  [] I have personally reviewed the patient?s radiographs  [] Radiographs reviewed with radiologist   [] No change from prior, tubes and lines in adequate position  [] Improved   [] Worsening    , MD    Electronically signed by , MD at 01/29/2021  3:12 PM EDT

## 2021-01-28 NOTE — Progress Notes (Signed)
Formatting of this note might be different from the original.  Discharge Plan:   home with home health     Discharge Date:     01/28/2021     Home Health Needed:     yes     Home Health Agency:    [facility]     FOC given:yes     Confirmed start of care with the home health agency and spoke with:      e-mail sent per protocol    Transportation: Medical        Transport Time:    1500    Family, MD, patient, nurse and facility aware of pickup time    yes   Electronically signed by Angela Adam at 01/28/2021  2:43 PM EDT

## 2021-02-04 ENCOUNTER — Ambulatory Visit: Payer: MEDICARE | Primary: Physician Assistant

## 2021-02-06 LAB — EKG 12-LEAD
Atrial Rate: 103 {beats}/min
P Axis: 84 degrees
P-R Interval: 158 ms
Q-T Interval: 346 ms
QRS Duration: 76 ms
QTc Calculation (Bazett): 453 ms
R Axis: 39 degrees
T Axis: 61 degrees
Ventricular Rate: 103 {beats}/min

## 2021-02-06 LAB — EKG, 12 LEAD, INITIAL
Atrial Rate: 103 {beats}/min
Calculated P Axis: 84 degrees
Calculated R Axis: 39 degrees
Calculated T Axis: 61 degrees
P-R Interval: 158 ms
Q-T Interval: 346 ms
QRS Duration: 76 ms
QTC Calculation (Bezet): 453 ms
Ventricular Rate: 103 {beats}/min

## 2021-02-24 ENCOUNTER — Inpatient Hospital Stay: Admit: 2021-02-24 | Primary: Physician Assistant

## 2021-03-04 ENCOUNTER — Ambulatory Visit: Payer: MEDICARE | Primary: Physician Assistant

## 2021-03-30 ENCOUNTER — Inpatient Hospital Stay: Admit: 2021-03-30 | Primary: Physician Assistant

## 2021-04-02 ENCOUNTER — Inpatient Hospital Stay: Admit: 2021-04-02 | Payer: MEDICARE | Primary: Physician Assistant

## 2021-04-02 LAB — POC URINE MICROSCOPIC

## 2021-04-02 LAB — URINALYSIS W/ RFLX MICROSCOPIC
Bilirubin, Urine: NEGATIVE
Bilirubin: NEGATIVE
Glucose, Ur: NEGATIVE mg/dl
Glucose: NEGATIVE mg/dL
Ketone: NEGATIVE mg/dL
Ketones, Urine: NEGATIVE mg/dl
Nitrite, Urine: NEGATIVE
Nitrites: NEGATIVE
Protein, UA: NEGATIVE mg/dl
Protein: NEGATIVE mg/dL
Specific Gravity, UA: 1.015 (ref 1.005–1.030)
Specific gravity: 1.015 (ref 1.005–1.030)
Urobilinogen, UA, POCT: 0.2 mg/dl (ref 0.0–1.0)
Urobilinogen: 0.2 mg/dL (ref 0.0–1.0)
pH (UA): 6.5 (ref 5.0–9.0)
pH, UA: 6.5 (ref 5.0–9.0)

## 2021-04-03 ENCOUNTER — Inpatient Hospital Stay: Admit: 2021-04-03 | Payer: MEDICARE | Primary: Physician Assistant

## 2021-04-03 LAB — CBC WITH AUTO DIFFERENTIAL
Basophils %: 0.2 % (ref 0–3)
Eosinophils %: 2.6 % (ref 0–5)
Hematocrit: 35.7 % — ABNORMAL LOW (ref 37.0–50.0)
Hemoglobin: 10.6 gm/dl — ABNORMAL LOW (ref 13.0–17.2)
Immature Granulocytes: 0.2 % (ref 0.0–3.0)
Lymphocytes %: 16.7 % — ABNORMAL LOW (ref 28–48)
MCH: 27 pg (ref 25.4–34.6)
MCHC: 29.7 gm/dl — ABNORMAL LOW (ref 30.0–36.0)
MCV: 91.1 fL (ref 80.0–98.0)
MPV: 11.7 fL — ABNORMAL HIGH (ref 6.0–10.0)
Monocytes %: 6.3 % (ref 1–13)
Neutrophils %: 74 % — ABNORMAL HIGH (ref 34–64)
Nucleated RBCs: 0 (ref 0–0)
Platelets: 195 10*3/uL (ref 140–450)
RBC: 3.92 M/uL (ref 3.60–5.20)
RDW-SD: 46.3 (ref 36.4–46.3)
WBC: 10 10*3/uL (ref 4.0–11.0)

## 2021-04-03 LAB — COMPREHENSIVE METABOLIC PANEL
ALT: <7 U/L — ABNORMAL LOW (ref 10–49)
AST: 10 U/L (ref 0.0–33.9)
Albumin: 3 gm/dl — ABNORMAL LOW (ref 3.4–5.0)
Alkaline Phosphatase: 81 U/L (ref 46–116)
Anion Gap: 4 mmol/L — ABNORMAL LOW (ref 5–15)
BUN: 13 mg/dl (ref 9–23)
CO2: >40 mEq/L — ABNORMAL HIGH (ref 20–31)
Calcium: 9.6 mg/dl (ref 8.7–10.4)
Chloride: 97 mEq/L — ABNORMAL LOW (ref 98–107)
Creatinine: 0.54 mg/dl — ABNORMAL LOW (ref 0.55–1.02)
EGFR IF NonAfrican American: >60
GFR African American: >60
Glucose: 111 mg/dl — ABNORMAL HIGH (ref 74–106)
Potassium: 4 mEq/L (ref 3.5–5.1)
Sodium: 141 mEq/L (ref 136–145)
Total Bilirubin: 0.3 mg/dl (ref 0.30–1.20)
Total Protein: 6.4 gm/dl (ref 5.7–8.2)

## 2021-04-03 LAB — HEMOGLOBIN A1C W/O EAG
Hemoglobin A1C: 6.5 % — ABNORMAL HIGH (ref 3.8–5.6)
Hemoglobin A1c: 6.5 % — ABNORMAL HIGH (ref 3.8–5.6)

## 2021-04-03 LAB — CULTURE, URINE
CULTURE RESULT: >100000 — AB
Culture Result: >100000 — AB

## 2021-04-03 LAB — CBC WITH AUTOMATED DIFF
BASOPHILS: 0.2 % (ref 0–3)
EOSINOPHILS: 2.6 % (ref 0–5)
HCT: 35.7 % — ABNORMAL LOW (ref 37.0–50.0)
HGB: 10.6 gm/dl — ABNORMAL LOW (ref 13.0–17.2)
IMMATURE GRANULOCYTES: 0.2 % (ref 0.0–3.0)
LYMPHOCYTES: 16.7 % — ABNORMAL LOW (ref 28–48)
MCH: 27 pg (ref 25.4–34.6)
MCHC: 29.7 gm/dl — ABNORMAL LOW (ref 30.0–36.0)
MCV: 91.1 fL (ref 80.0–98.0)
MONOCYTES: 6.3 % (ref 1–13)
MPV: 11.7 fL — ABNORMAL HIGH (ref 6.0–10.0)
NEUTROPHILS: 74 % — ABNORMAL HIGH (ref 34–64)
NRBC: 0 (ref 0–0)
PLATELET: 195 10*3/uL (ref 140–450)
RBC: 3.92 M/uL (ref 3.60–5.20)
RDW-SD: 46.3 (ref 36.4–46.3)
WBC: 10 10*3/uL (ref 4.0–11.0)

## 2021-04-03 LAB — METABOLIC PANEL, COMPREHENSIVE
ALT (SGPT): <7 U/L — ABNORMAL LOW (ref 10–49)
AST (SGOT): 10 U/L (ref 0.0–33.9)
Albumin: 3 gm/dl — ABNORMAL LOW (ref 3.4–5.0)
Alk. phosphatase: 81 U/L (ref 46–116)
Anion gap: 4 mmol/L — ABNORMAL LOW (ref 5–15)
BUN: 13 mg/dL (ref 9–23)
Bilirubin, total: 0.3 mg/dL (ref 0.30–1.20)
CO2: >40 mEq/L — ABNORMAL HIGH (ref 20–31)
Calcium: 9.6 mg/dL (ref 8.7–10.4)
Chloride: 97 mEq/L — ABNORMAL LOW (ref 98–107)
Creatinine: 0.54 mg/dl — ABNORMAL LOW (ref 0.55–1.02)
GFR est AA: >60
GFR est non-AA: >60
Glucose: 111 mg/dl — ABNORMAL HIGH (ref 74–106)
Potassium: 4 meq/L (ref 3.5–5.1)
Protein, total: 6.4 g/dL (ref 5.7–8.2)
Sodium: 141 meq/L (ref 136–145)

## 2021-04-08 ENCOUNTER — Ambulatory Visit: Payer: MEDICARE | Attending: Orthopaedic Surgery | Primary: Physician Assistant

## 2021-04-12 MED ORDER — ALLOPURINOL 100 MG TAB
100 mg | ORAL_TABLET | ORAL | 1 refills | Status: AC
Start: 2021-04-12 — End: ?

## 2021-04-12 NOTE — Telephone Encounter (Signed)
Formatting of this note might be different from the original.  Call pt for rx pick up  Electronically signed by Sharen Hones, PA-C at 04/12/2021 10:06 AM EDT

## 2021-04-12 NOTE — Telephone Encounter (Signed)
Call pt for rx pick up

## 2021-05-11 ENCOUNTER — Emergency Department: Admit: 2021-05-12 | Payer: MEDICARE | Primary: Physician Assistant

## 2021-05-11 ENCOUNTER — Inpatient Hospital Stay
Admit: 2021-05-11 | Discharge: 2021-05-26 | Disposition: A | Discharge status: Home Health | Payer: MEDICARE | Attending: Family Medicine | Admitting: Family Medicine

## 2021-05-11 ENCOUNTER — Emergency Department: Admit: 2021-05-11 | Payer: MEDICARE | Primary: Physician Assistant

## 2021-05-11 DIAGNOSIS — J9601 Acute respiratory failure with hypoxia: Secondary | ICD-10-CM

## 2021-05-11 DIAGNOSIS — J9602 Acute respiratory failure with hypercapnia: Secondary | ICD-10-CM

## 2021-05-11 DIAGNOSIS — A419 Sepsis, unspecified organism: Principal | ICD-10-CM

## 2021-05-11 LAB — CBC WITH AUTO DIFFERENTIAL
Basophils %: 0.4 % (ref 0–3)
Eosinophils %: 1.6 % (ref 0–5)
Hematocrit: 41.2 % (ref 37.0–50.0)
Hemoglobin: 12 gm/dl — ABNORMAL LOW (ref 13.0–17.2)
Immature Granulocytes: 1.3 % (ref 0.0–3.0)
Lymphocytes %: 23.7 % — ABNORMAL LOW (ref 28–48)
MCH: 26.8 pg (ref 25.4–34.6)
MCHC: 29.1 gm/dl — ABNORMAL LOW (ref 30.0–36.0)
MCV: 92.2 fL (ref 80.0–98.0)
MPV: 11.7 fL — ABNORMAL HIGH (ref 6.0–10.0)
Monocytes %: 7.6 % (ref 1–13)
Neutrophils %: 65.4 % — ABNORMAL HIGH (ref 34–64)
Nucleated RBCs: 0 (ref 0–0)
Platelets: 172 10*3/uL (ref 140–450)
RBC: 4.47 M/uL (ref 3.60–5.20)
RDW-SD: 49.1 — ABNORMAL HIGH (ref 36.4–46.3)
WBC: 19 10*3/uL — ABNORMAL HIGH (ref 4.0–11.0)

## 2021-05-11 LAB — POC BLOOD GAS + LACTIC ACID
BASE EXCESS: 18 mmol/L — ABNORMAL HIGH (ref <=3)
BICARBONATE: 45.7 mmol/L — CR (ref 18.0–26.0)
Base Excess: 18 mmol/L — ABNORMAL HIGH (ref <=3)
CO2 Total: 49 mmol/L — ABNORMAL HIGH (ref 24–29)
CO2, TOTAL: 49 mmol/L — ABNORMAL HIGH (ref 24–29)
EPAP/CPAP/PEEP: 12
EPAP/CPAP/PEEP: 12
FIO2: 40
FIO2: 40
HCO3: 45.7 mmol/L (ref 18.0–26.0)
IPAP/PIP: 22
IPAP/PIP: 22
LACTIC ACID: 1.44 mmol/L (ref 0.40–2.00)
Lactic Acid: 1.44 mmol/L (ref 0.40–2.00)
O2 SAT: 95 % (ref 90–100)
O2 Sat: 95 % (ref 90–100)
PCO2: 113.5 mm Hg (ref 35.0–45.0)
PCO2: 113.5 mm Hg — CR (ref 35.0–45.0)
PO2: 100 mm Hg (ref 75–100)
PO2: 100 mm Hg (ref 75–100)
RESPIRATORY RATE: 20
Respiratory Rate: 20
SET RATE: 12
Set Rate: 12
VTEXP, VTEXP: 664
VTEXP: 664
pH: 7.213 — CL (ref 7.350–7.450)
pH: 7.213 — CL (ref 7.350–7.450)

## 2021-05-11 LAB — BASIC METABOLIC PANEL
Anion Gap: 8 mmol/L (ref 5–15)
BUN: 10 mg/dl (ref 9–23)
CO2: 36 mEq/L — ABNORMAL HIGH (ref 20–31)
Calcium: 9.3 mg/dl (ref 8.7–10.4)
Chloride: 93 mEq/L — ABNORMAL LOW (ref 98–107)
Creatinine: 0.65 mg/dl (ref 0.55–1.02)
EGFR IF NonAfrican American: >60
GFR African American: >60
Glucose: 165 mg/dl — ABNORMAL HIGH (ref 74–106)
Potassium: 5.7 mEq/L — ABNORMAL HIGH (ref 3.5–5.1)
Sodium: 137 mEq/L (ref 136–145)

## 2021-05-11 LAB — LACTIC ACID
LACTIC ACID: 2.7 mmol/L — ABNORMAL HIGH (ref 0.5–2.2)
Lactic Acid: 2.7 mmol/L — ABNORMAL HIGH (ref 0.5–2.2)

## 2021-05-11 LAB — TROPONIN, HIGH SENSITIVITY: Troponin, High Sensitivity: <3 ng/L (ref 0–34)

## 2021-05-11 LAB — MAGNESIUM
Magnesium: 1.9 mg/dL (ref 1.6–2.6)
Magnesium: 1.9 mg/dL (ref 1.6–2.6)

## 2021-05-11 LAB — PROBNP, N-TERMINAL: BNP: 22 (ref 0–125)

## 2021-05-11 LAB — CBC WITH AUTOMATED DIFF
BASOPHILS: 0.4 % (ref 0–3)
EOSINOPHILS: 1.6 % (ref 0–5)
HCT: 41.2 % (ref 37.0–50.0)
HGB: 12 gm/dl — ABNORMAL LOW (ref 13.0–17.2)
IMMATURE GRANULOCYTES: 1.3 % (ref 0.0–3.0)
LYMPHOCYTES: 23.7 % — ABNORMAL LOW (ref 28–48)
MCH: 26.8 pg (ref 25.4–34.6)
MCHC: 29.1 gm/dl — ABNORMAL LOW (ref 30.0–36.0)
MCV: 92.2 fL (ref 80.0–98.0)
MONOCYTES: 7.6 % (ref 1–13)
MPV: 11.7 fL — ABNORMAL HIGH (ref 6.0–10.0)
NEUTROPHILS: 65.4 % — ABNORMAL HIGH (ref 34–64)
NRBC: 0 (ref 0–0)
PLATELET: 172 10*3/uL (ref 140–450)
RBC: 4.47 M/uL (ref 3.60–5.20)
RDW-SD: 49.1 — ABNORMAL HIGH (ref 36.4–46.3)
WBC: 19 10*3/uL — ABNORMAL HIGH (ref 4.0–11.0)

## 2021-05-11 LAB — METABOLIC PANEL, BASIC
Anion gap: 8 mmol/L (ref 5–15)
BUN: 10 mg/dl (ref 9–23)
CO2: 36 mEq/L — ABNORMAL HIGH (ref 20–31)
Calcium: 9.3 mg/dl (ref 8.7–10.4)
Chloride: 93 mEq/L — ABNORMAL LOW (ref 98–107)
Creatinine: 0.65 mg/dl (ref 0.55–1.02)
GFR est AA: >60
GFR est non-AA: >60
Glucose: 165 mg/dl — ABNORMAL HIGH (ref 74–106)
Potassium: 5.7 mEq/L — ABNORMAL HIGH (ref 3.5–5.1)
Sodium: 137 mEq/L (ref 136–145)

## 2021-05-11 LAB — NT-PRO BNP: NT pro-BNP: 22 (ref 0–125)

## 2021-05-11 LAB — TROPONIN-HIGH SENSITIVITY: Troponin-High Sensitivity: <3 ng/L (ref 0–34)

## 2021-05-11 MED ORDER — METHYLPREDNISOLONE (PF) 125 MG/2 ML IJ SOLR
125 mg/2 mL | INTRAMUSCULAR | Status: AC
Start: 2021-05-11 — End: 2021-05-11
  Administered 2021-05-12: 01:00:00 via INTRAVENOUS

## 2021-05-11 MED ORDER — IPRATROPIUM-ALBUTEROL 2.5 MG-0.5 MG/3 ML NEB SOLUTION
2.5 mg-0.5 mg/3 ml | RESPIRATORY_TRACT | Status: AC
Start: 2021-05-11 — End: 2021-05-11
  Administered 2021-05-12: 03:00:00 via RESPIRATORY_TRACT

## 2021-05-11 NOTE — ED Notes (Signed)
Multiple unsuccessful ultrasound IV attempts made by MD Resident. PICC team consult placed.

## 2021-05-11 NOTE — ED Provider Notes (Signed)
ED Provider Notes by Sunday Spillers, MD at 05/11/21 2056                Author: Sunday Spillers, MD  Service: EMERGENCY  Author Type: Resident       Filed: 05/11/21 2328  Date of Service: 05/11/21 2056  Status: Attested           Editor: Sunday Spillers, MD (Resident)  Cosigner: Erling Conte, MD at 05/12/21 0142          Attestation signed by Erling Conte, MD at 05/12/21 0142          Krista Blue, M.D., discussed with the resident and agree with the evaluation and plans as documented here.  I evaluated the patient and was available to  address any questions during Emergency Department evaluation.   Acute respiratory failure with hypercapnia and hypoxia.  COPD with emphysema and large blebs.  On Eliquis secondary to recent upper extremity/subclavian DVT.  Presented with respiratory distress and placed on BiPAP.  Treated with Solu-Medrol and nebulizers.   Awake but nonconversant and unable to give any information.  Patient's husband initially stated patient was DNR and initially declined intubation.  As patient did not improve on BiPAP, I readdressed the subject with husband and he later agrees to intubation  after stating that he found documentation that she would want trial of full support in this circumstance.  Patient intubated by resident.  No pulmonary embolus on CTA.  Possible left upper lobe infiltrate.  Antibiotics added.  Continued respiratory support.   Admit ICU.  Discussed with Lurene Shadow and Dr. Langston Masker.      Critical Care Time   Critical care time excluding procedures, but including direct patient care, reviewing medical records, evaluating results of diagnostic testing, discussions with family members, and consulting with  physicians:90 minutes                                 [facility]   Emergency Department Treatment Report                Patient: [name] [name]  Age: 70 y.o.  Sex: female          Date of Birth: 10-18-1950  Admit Date: 05/11/2021  PCP: Marjory Lies     MRN: [id number]   CSN: [id number]            Room: ER25/ER25  Time Dictated: 8:56 PM          Chief Complaint      Chief Complaint       Patient presents with        ?  Respiratory Distress        ?  Cough             History of Present Illness     70 y.o. female presenting the emergency department with EMS for reported several days of shortness of breath that worsened acutely today.  She has a history of COPD hypertension and  arrives alert but minimally responsive.  She is able to nod and answer yes/no questions but is difficult to get a full history from her.        Review of Systems     Review of Systems    Unable to perform ROS: Mental status change          Past Medical/Surgical History  Past Medical History:        Diagnosis  Date         ?  Arthritis       ?  Chronic pain syndrome            related to R hip replacement         ?  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  DM2 (diabetes mellitus, type 2) (HCC)       ?  GERD (gastroesophageal reflux disease)       ?  Hepatitis C            HEP C         ?  HTN (hypertension)       ?  Lung nodule, multiple            (CT 10/2016) Small left upper lobe and 1.7 x 1.6 cm left lower lobe nodules not significantly changed from 07/23/2016 and 03/22/2016         ?  Marijuana use       ?  PTSD (post-traumatic stress disorder)            lived through the [place]         ?  Thoracic ascending aortic aneurysm (HCC)            4.4cm noted on CT Chest (10/2016)         ?  Thyroid nodule            2.5 cm stable right thyroid nodule (CT 10/2016)          Past Surgical History:         Procedure  Laterality  Date          ?  HX ENDOSCOPY         ?  HX HERNIA REPAIR    2019          x2          ?  HX HIP REPLACEMENT  Right  01/29/2015     ?  HX HIP REPLACEMENT         ?  HX HYSTERECTOMY    2010          hysterectomy             Social History          Social History           Socioeconomic History         ?  Marital status:  MARRIED              Spouse name:  Not on file         ?  Number of children:  Not on file     ?  Years of education:  Not on file     ?  Highest education level:  Not on file       Occupational History        ?  Not on file       Tobacco Use         ?  Smoking status:  Some Days     ?  Smokeless tobacco:  Never        ?  Tobacco comments:  reported as quit smoking around 1990s per spouse       Substance and Sexual Activity         ?  Alcohol use:  No     ?  Drug use:  Yes              Types:  Marijuana             Comment: reports smoking last week-marijuana         ?  Sexual activity:  Never        Other Topics  Concern        ?  Not on file       Social History Narrative        ?  Not on file          Social Determinants of Health          Financial Resource Strain: Not on file     Food Insecurity: Not on file     Transportation Needs: Not on file     Physical Activity: Not on file     Stress: Not on file     Social Connections: Not on file     Intimate Partner Violence: Not on file       Housing Stability: Not on file             Family History          Family History         Problem  Relation  Age of Onset          ?  Hypertension  Mother       ?  Other  Mother                OSA          ?  Cancer  Father                suspected leukemia per pt's spouse          ?  Cancer  Maternal Aunt            ?  Alcohol abuse  Maternal Grandfather               Current Medications          Prior to Admission Medications     Prescriptions  Last Dose  Informant  Patient Reported?  Taking?      QUEtiapine SR (SEROQUEL XR) 150 mg sr tablet      Yes  No      Sig: Take 150 mg by mouth nightly as needed.      acetaminophen (TYLENOL) 325 mg tablet      No  No      Sig: Take 2 Tabs by mouth every four (4) hours as needed for Pain.      albuterol (ACCUNEB) 1.25 mg/3 mL nebu      No  No      Sig: USE 1 VIAL IN NEBULIZER EVERY 6 HOURS AS NEEDED FOR  SHORTNESS  OF   BREATH,  WHEEZING      albuterol (PROVENTIL HFA, VENTOLIN HFA, PROAIR HFA) 90 mcg/actuation inhaler      No  No      Sig: Take 2 Puffs by inhalation every four (4) hours as needed for Wheezing.      allopurinoL (ZYLOPRIM) 100 mg tablet      No  No  Sig: TAKE 1 TABLET TWICE DAILY      apixaban (Eliquis DVT-PE Treat 30D Start) 5 mg (74 tabs) starter pack      No  No      Sig: Take 10 mg (two 5 mg tablets) by mouth twice a day for 2 days till 8/20   Followed by 5 mg (one 5 mg tablet) by mouth twice a day starting 8/21      butalbital-acetaminophen-caffeine (FIORICET, ESGIC) 50-325-40 mg per tablet      Yes  No      Sig: butalbital-acetaminophen-caffeine 50 mg-325 mg-40 mg tablet    TAKE 1 TABLET BY MOUTH EVERY 4 HOURS AS NEEDED FOR HEADACHE      cetirizine (ZYRTEC) 10 mg tablet      No  No      Sig: Take 1 Tab by mouth daily.      cloNIDine HCL (CATAPRES) 0.2 mg tablet      No  No      Sig: Take 1 Tablet by mouth two (2) times a day.      dilTIAZem CD (CARDIZEM CD) 240 mg ER capsule      No  No      Sig: Take 1 Cap by mouth daily.      fluticasone propion-salmeterol (ADVAIR DISKUS) 100-50 mcg/dose diskus inhaler      No  No      Sig: Take 1 Puff by inhalation two (2) times a day.      fluticasone propionate (FLONASE) 50 mcg/actuation nasal spray      No  No      Sig: 2 Sprays by Both Nostrils route daily as needed for Rhinitis or Allergies. Indications: inflammation of the nose due to an allergy      insulin aspart U-100 (NovoLOG U-100 Insulin aspart) 100 unit/mL injection      No  No      Sig: 7 Units by SubCUTAneous route Before breakfast, lunch, and dinner.      insulin glargine (Lantus Solostar U-100 Insulin) 100 unit/mL (3 mL) inpn      No  No      Sig: 25 Units by SubCUTAneous route nightly.      losartan (COZAAR) 100 mg tablet      No  No      Sig: Take 1 Tablet by mouth daily.      magnesium oxide (MAG-OX) 400 mg tablet      Yes  No      Sig: Take 400 mg by mouth daily.      metFORMIN ER (GLUCOPHAGE XR) 500  mg tablet      No  No      Sig: TAKE 1 TABLET TWICE DAILY      montelukast (SINGULAIR) 10 mg tablet      No  No      Sig: TAKE 1 TABLET EVERY DAY      polyethylene glycol (MIRALAX) 17 gram packet      No  No      Sig: Take 1 Packet by mouth daily as needed for Constipation. Indications: constipation      potassium chloride SR (K-TAB) 20 mEq tablet      Yes  No      Sig: potassium chloride ER 20 mEq tablet,extended release    TAKE 1 TABLET BY MOUTH ONCE DAILY      predniSONE (STERAPRED) 5 mg dose pack      No  No  Sig: See administration instruction per 5mg  dose pack      rosuvastatin (CRESTOR) 5 mg tablet      No  No      Sig: TAKE 1 TABLET BY MOUTH NIGHTLY      senna-docusate (PERICOLACE) 8.6-50 mg per tablet      No  No      Sig: Take 2 Tablets by mouth nightly.      tiotropium bromide (SPIRIVA RESPIMAT) 2.5 mcg/actuation inhaler      No  No      Sig: Take 1 Puff by inhalation daily.               Facility-Administered Medications: None             Allergies          Allergies        Allergen  Reactions         ?  Chocolate [Cocoa]  Sneezing             Confirms allergy but reports "I still eat it"             Physical Exam          ED Triage Vitals     ED Encounter Vitals Group            BP  05/11/21 1743  (!) 189/88        Pulse (Heart Rate)  05/11/21 1745  92        Resp Rate  05/11/21 1745  18        Temp  05/11/21 1818  98.9 ??F (37.2 ??C)        Temp src  --          O2 Sat (%)  05/11/21 1743  (!) 67 %        Weight  05/11/21 1743  227 lb            Height  05/11/21 1743  5\' 7"            Physical Exam   Constitutional :        General: She is in acute distress.       Appearance: She is ill-appearing and diaphoretic .    HENT:       Head: Normocephalic and atraumatic.       Right Ear: External ear normal.       Left Ear: External ear normal.       Nose: Nose normal.    Eyes:       General:          Right eye: No discharge.          Left eye: No discharge.       Extraocular Movements: Extraocular movements  intact.     Cardiovascular:       Rate and Rhythm: Normal rate and regular rhythm.    Pulmonary:       Effort: Respiratory distress present.       Breath sounds: Rales present.     Abdominal:       General: There is no distension.       Tenderness: There is no abdominal tenderness.     Musculoskeletal:       Right lower leg: No edema.       Left lower leg: No edema.     Neurological:       Mental Status: She is alert.  Impression and Management Plan     70 year old female with history of COPD presenting the emergency department for respiratory distress and diaphoresis.  Vital signs notable for hypoxemia on arrival requiring BiPAP.   Physical exam notable for rales in the bases bilaterally.     DDX COPD exacerbation, ACS, congestive heart failure exacerbation, PE, versus others     Diagnostic Studies     Lab:      Recent Results (from the past 12 hour(s))     CBC WITH AUTOMATED DIFF          Collection Time: 05/11/21  6:00 PM         Result  Value  Ref Range            WBC  19.0 (H)  4.0 - 11.0 1000/mm3       RBC  4.47  3.60 - 5.20 M/uL       HGB  12.0 (L)  13.0 - 17.2 gm/dl       HCT  16.1  09.6 - 50.0 %       MCV  92.2  80.0 - 98.0 fL       MCH  26.8  25.4 - 34.6 pg       MCHC  29.1 (L)  30.0 - 36.0 gm/dl       PLATELET  045  409 - 450 1000/mm3       MPV  11.7 (H)  6.0 - 10.0 fL       RDW-SD  49.1 (H)  36.4 - 46.3         NRBC  0  0 - 0         IMMATURE GRANULOCYTES  1.3  0.0 - 3.0 %       NEUTROPHILS  65.4 (H)  34 - 64 %       LYMPHOCYTES  23.7 (L)  28 - 48 %       MONOCYTES  7.6  1 - 13 %       EOSINOPHILS  1.6  0 - 5 %       BASOPHILS  0.4  0 - 3 %       METABOLIC PANEL, BASIC          Collection Time: 05/11/21  6:00 PM         Result  Value  Ref Range            Potassium  5.7 (H)  3.5 - 5.1 mEq/L       Chloride  93 (L)  98 - 107 mEq/L       Sodium  137  136 - 145 mEq/L       CO2  36 (H)  20 - 31 mEq/L       Glucose  165 (H)  74 - 106 mg/dl       BUN  10  9 - 23 mg/dl       Creatinine  8.11  0.55 - 1.02  mg/dl       GFR est AA  >91.4          GFR est non-AA  >60          Calcium  9.3  8.7 - 10.4 mg/dl       Anion gap  8  5 - 15 mmol/L       NT-PRO BNP          Collection Time: 05/11/21  6:00 PM  Result  Value  Ref Range            NT pro-BNP  22  0 - 125         MAGNESIUM          Collection Time: 05/11/21  6:00 PM         Result  Value  Ref Range            Magnesium  1.9  1.6 - 2.6 mg/dL       LACTIC ACID          Collection Time: 05/11/21  6:00 PM         Result  Value  Ref Range            Lactic Acid  2.7 (H)  0.5 - 2.2 mmol/L       TROPONIN-HIGH SENSITIVITY          Collection Time: 05/11/21  6:00 PM         Result  Value  Ref Range            Troponin-High Sensitivity  <3  0 - 34 ng/L       PROCALCITONIN          Collection Time: 05/11/21  6:00 PM         Result  Value  Ref Range            PROCALCITONIN  <0.05  0.00 - 0.50 ng/ml       POC BLOOD GAS + LACTIC ACID          Collection Time: 05/11/21  6:39 PM         Result  Value  Ref Range            pH  7.213 (LL)  7.350 - 7.450         PCO2  113.5 (HH)  35.0 - 45.0 mm Hg       PO2  100.0  75 - 100 mm Hg       BICARBONATE  45.7 (HH)  18.0 - 26.0 mmol/L       O2 SAT  95.0  90 - 100 %       CO2, TOTAL  49.0 (H)  24 - 29 mmol/L       Lactic Acid  1.44  0.40 - 2.00 mmol/L       BASE EXCESS  18 (H)  -2 - 3 mmol/L       Sample type  Art          FIO2  40          SITE  R Radial          DEVICE  BiPAP          ALLENS TEST  Pass          IPAP/PIP  22          EPAP/CPAP/PEEP  12          Respiratory Rate  20          SET RATE  12          VTEXP  664          SARS-COV-2_FLU_RSV          Collection Time: 05/11/21  8:00 PM       Specimen: NASOPHARYNGEAL SWAB         Result  Value  Ref Range  COVID-19  NEGATIVE  NEGATIVE         Influenza A PCR  NEGATIVE  NEGATIVE         Influenza B PCR  NEGATIVE  NEGATIVE         RSV by PCR  NEGATIVE  NEGATIVE         POC CHEM8          Collection Time: 05/11/21  8:21 PM         Result  Value  Ref Range             Sodium  136  136 - 145 mEq/L       Potassium  5.5 (H)  3.5 - 4.9 mEq/L       Chloride  90 (L)  98 - 107 mEq/L       CO2, TOTAL  44 (HH)  21 - 32 mmol/L       Glucose  162 (H)  74 - 106 mg/dL       BUN  20  7 - 25 mg/dl       Creatinine  0.8  0.6 - 1.3 mg/dl       HCT  43  38 - 45 %       HGB  14.6  12.4 - 17.2 gm/dl       CALCIUM,IONIZED  4.40  4.40 - 5.40 mg/dL       POC BLOOD GAS + LACTIC ACID          Collection Time: 05/11/21  8:21 PM         Result  Value  Ref Range            pH  7.201 (LL)  7.350 - 7.450         PCO2  114.3 (HH)  35.0 - 45.0 mm Hg       PO2  59.0 (L)  75 - 100 mm Hg       BICARBONATE  44.9 (HH)  18.0 - 26.0 mmol/L       O2 SAT  81.0 (L)  90 - 100 %       CO2, TOTAL  48.0 (H)  24 - 29 mmol/L       Lactic Acid  0.90  0.40 - 2.00 mmol/L       BASE EXCESS  17 (H)  -2 - 3 mmol/L       Patient temp.  98.0 F          Sample type  Art          FIO2  40          SITE  L Radial          DEVICE  BiPAP          ALLENS TEST  Pass          IPAP/PIP  22          EPAP/CPAP/PEEP  12          Respiratory Rate  20          SET RATE  12          VTEXP  300          POC BLOOD GAS + LACTIC ACID          Collection Time: 05/11/21  9:55 PM         Result  Value  Ref Range  pH  7.192 (LL)  7.350 - 7.450         PCO2  114.2 (HH)  35.0 - 45.0 mm Hg       PO2  70.0 (L)  75 - 100 mm Hg       BICARBONATE  44.1 (HH)  18.0 - 26.0 mmol/L       O2 SAT  87.0 (L)  90 - 100 %       CO2, TOTAL  48.0 (H)  24 - 29 mmol/L            Lactic Acid  0.92  0.40 - 2.00 mmol/L            BASE EXCESS  16 (H)  -2 - 3 mmol/L       Patient temp.  98.0 F          Sample type  Art          FIO2  50          SITE  L Radial          DEVICE  BiPAP          ALLENS TEST  Pass          IPAP/PIP  24          EPAP/CPAP/PEEP  12          Respiratory Rate  20          SET RATE  16               VTEXP  337             Labs Reviewed       CBC WITH AUTOMATED DIFF - Abnormal; Notable for the following components:            Result  Value             WBC  19.0 (*)         HGB  12.0 (*)         MCHC  29.1 (*)         MPV  11.7 (*)         RDW-SD  49.1 (*)         NEUTROPHILS  65.4 (*)         LYMPHOCYTES  23.7 (*)            All other components within normal limits       METABOLIC PANEL, BASIC - Abnormal; Notable for the following components:            Potassium  5.7 (*)         Chloride  93 (*)         CO2  36 (*)         Glucose  165 (*)            All other components within normal limits       LACTIC ACID - Abnormal; Notable for the following components:            Lactic Acid  2.7 (*)            All other components within normal limits       POC BLOOD GAS + LACTIC ACID - Abnormal; Notable for the following components:            pH  7.213 (*)         PCO2  113.5 (*)  BICARBONATE  45.7 (*)         CO2, TOTAL  49.0 (*)         BASE EXCESS  18 (*)            All other components within normal limits       POC CHEM8 - Abnormal; Notable for the following components:            Potassium  5.5 (*)         Chloride  90 (*)         CO2, TOTAL  44 (*)         Glucose  162 (*)            All other components within normal limits       POC BLOOD GAS + LACTIC ACID - Abnormal; Notable for the following components:            pH  7.201 (*)         PCO2  114.3 (*)         PO2  59.0 (*)         BICARBONATE  44.9 (*)         O2 SAT  81.0 (*)         CO2, TOTAL  48.0 (*)         BASE EXCESS  17 (*)            All other components within normal limits       POC BLOOD GAS + LACTIC ACID - Abnormal; Notable for the following components:            pH  7.192 (*)         PCO2  114.2 (*)         PO2  70.0 (*)         BICARBONATE  44.1 (*)         O2 SAT  87.0 (*)         CO2, TOTAL  48.0 (*)         BASE EXCESS  16 (*)            All other components within normal limits       SARS-COV-2_FLU_RSV     CULTURE, BLOOD     CULTURE, BLOOD     NT-PRO BNP     MAGNESIUM     TROPONIN-HIGH SENSITIVITY     PROCALCITONIN     URINALYSIS W/ RFLX MICROSCOPIC     BLOOD GAS, ARTERIAL      BLOOD GAS, ARTERIAL     POC ARTERIAL BLOOD GAS (ABG)       POC CHEM8           Imaging:     XR CHEST SNGL V      Result Date: 05/11/2021   EXAM: XR CHEST SNGL V INDICATION: SOB  COMPARISON: 01/24/2021 WORKSTATION ID: [id number] TECHNIQUE: Frontal view. FINDINGS: SUPPORT DEVICES: None. LUNGS/PLEURA: No consolidation or pleural effusion. Stable bullous changes in the left upper lobe. Unchanged  rounded calcified nodule in the left lung. HEART/MEDIASTINUM: Cardiomediastinal silhouette is stable in appearance. OTHER: None.       IMPRESSION: No acute cardiopulmonary process. Electronically signed by: Albin Felling, MD 05/11/2021 6:18 PM EST       CT CHEST W CON PE PROTOCOL      Result Date: 05/11/2021   All CT exams at this facility use one or more dose reduction  techniques including automatic exposure control, mA/kV adjustment per patient's size, or iterative reconstruction technique. Clinical History: rule out PE.   Examination: CTA CHEST with contrast.  3 mm spiral scanning was performed from the lung apices to the upper pole of the kidneys. 3D MIP coronal and sagittal reconstruction imaging has been performed.  Correlation: 08/30/2018, 02/18/2018 and 07/03/2015 Findings: Degenerative changes of the spine.  Secretions dependent portions of the trachea. Dense airspace opacity with air bronchograms left upper lobe. There are bilateral bullae. Dependent atelectasis lung bases. 2 cm left lower lobe nodule slightly increased in size from 08/30/2018, 02/18/2018 and  07/02/2025 ((prior measurement 1.7, 1.9 and 1.3 cm). No dissection thoracic aorta. Ascending aorta measures 4.7 cm unchanged from prior studies. The esophagus is not dilated. No lymph node enlargement in the axilla, mediastinum or hila. No pulmonary embolism.  Visualized portions of the liver, pancreas, adrenal glands are unremarkable. 2.3 cm splenic lesion unchanged from 02/18/2018. 1.8 cm midpole right renal hypodensity, ultrasound characterization suggested.        IMPRESSION: 1. No pulmonary embolism. 2. Bilateral bullae 3. Left upper lobe pneumonia/volume loss. Radiographic follow-up to resolution suggested. 4. 2 cm left lower lobe nodule not significantly changed from prior studies. 5. 2.3 cm stable splenic lesion,  unchanged from 02/18/2018. 6. 1.8 cm probable cyst midpole right kidney, ultrasound characterization suggested. Electronically signed by: Jolayne Panther, MD 05/11/2021 10:11 PM EST        Pre hospital EKG as read by me shows sinus rhythm   EKG as read by me shows normal sinus rhythm with no evidence of ischemia or infarction.           ED Course/MDM     Blood gas with severe respiratory acidosis.  Patient immediately placed on BiPAP with respiratory therapy at bedside.  Continues DuoNebs ordered.  Steroids ordered.        ED Course as of 05/11/21 2327       Tue May 11, 2021        1904  CBC with leukocytosis with left shift and blood gas consistent with severe COPD with respiratory acidosis with metabolic compensation.  Patient currently  on BiPAP to improve ventilation and to block the CO2.  Her proBNP is not elevated and her chest x-ray is not impressively wet with pulmonary edema.  Feel that the most likely driving etiology at this point is severe COPD exacerbation.  DuoNebs and methylprednisolone  ordered.  Respiratory at bedside. [MO]              ED Course User Index   [MO] Sunday Spillers, MD           Had long discussion with husband regarding CODE STATUS and he seemed to wax and stating that she did have a DNR in the past however he is not able to produce one and at this point he is stating that he does adamantly want everything done if the patient  were to go into cardiac arrest.  He states that he does want CPR and at this point with her declining respiratory status we explained how she requires mechanical intubation and life support by breathing tube and he agrees that he does want this performed  at this time, despite his prior mention of thinking that  she had a DNR in the past.  Both my attending Dr. Kizzie Bane and I confirmed this with the patient's husband and spoke with him about the subject at length.  He demonstrates good understanding of  the  situation and the implications.      Antibiotics started for potential left upper lobe pneumonia noted on CT.      Because of severe respiratory acidosis and hypoxemia patient was intubated and mechanically ventilated and admitted to the ICU for ongoing management of what appears to be a severe COPD exacerbation with respiratory failure.      Bedside telemetry monitoring ordered due to respiratory distress.  My interpretation of the cardiac monitor is sinus rhythm at 86 BPM       Critical care time 90         Final Diagnosis                 ICD-10-CM  ICD-9-CM          1.  Acute respiratory failure with hypoxia and hypercapnia (HCC)   J96.01  518.81           J96.02            2.  Leukocytosis, unspecified type   D72.829  288.60     3.  Acute hyperkalemia   E87.5  276.7          4.  Hypercarbia   R06.89  786.09             Disposition     ICU         The patient was personally evaluated by myself and Dr. Kizzie Bane who agrees with the above assessment and plan.   Alphonzo Lemmings, MD   [facility]   May 11, 2021      My signature above authenticates this document and my orders, the final     diagnosis (es), discharge prescription (s), and instructions in the Epic     record.   If you have any questions please contact [phone].       Nursing notes have been reviewed by the physician/ advanced practice     Clinician.      Dragon medical dictation software was used for portions of this report. Unintended voice recognition errors may occur.

## 2021-05-11 NOTE — ED Notes (Signed)
LE: 1740: PT arrived from home c/o 2 days of SOB, cough and weakness. Pt has been weak for "a while" per family to EMS. PT is unable to walk but per EMS that has been going on for several months. Pt is diaphoretic on arrival and placed on her normal 3 L NC. Pt SpO2 went from 93% to 67%. Pt placed on NRB and SPO2 IS 94%. EKG obtained and IV access attempted without success. Per EMS pt had had dialysis one time in the past.     1750 pt moved to room 25 and placed on bipap and ultrasound IV access obtained. Provider at bedside doing a ultrasound on pt.

## 2021-05-11 NOTE — H&P (Signed)
Chart reviewed.  Patient seen and examined.  Complete H&P to follow.

## 2021-05-11 NOTE — H&P (Signed)
H&P by Otilio Carpen, MD at 05/11/21 2333                Author: Otilio Carpen, MD  Service: Hospitalist  Author Type: Physician       Filed: 05/12/21 0805  Date of Service: 05/11/21 2333  Status: Signed          Editor: Otilio Carpen, MD (Physician)                       History and Physical      DOS: May 11, 2021      Patient: [name] [name]               Sex: female          DOA: 05/11/2021         Date of Birth:  [birth year]      Age:  70 y.o.        LOS:  LOS: 0 days         MRN: [id number]            Impression/Plan           70 y.o. female with hx of HTN, COPD, GERD, DM II, Hepatitis C, PTSD, and Chronic Pain Syndrome with the following:           1.  Acute hypoxic hypercapnic respiratory failure s/p intubation    2.  Acute respiratory distress s/p Intubation     3.  Severe sepsis     4.  Septic Shock     5.  Pneumonia (CAP), L UL    6.  Lactic acidosis    7.  Leukocytosis     8.  COPD with acute exacerbation    9.  Severe COPD/bullous emphysema   10. Hyperkalemia   11. DM II, Uncontrolled with Hyperglycemia   12. Hx hypertension   13. Hx hepatitis C   14. Hx PTSD   15. 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 Chronic pain syndrome         PLAN:     Admit to INPT, ICU for critical care management   Ventilator management per protocol   GI and DVT prophylaxis.  Replace electrolytes as needed.     Monitor potassium levels.  Hyperkalemia protocol as needed.  Trend lactic acid levels.   Cultures have been sent and are pending.   Continue IV Zosyn and IV vancomycin after initial doses have been given.   Adjust antibiotics based on culture results and response to treatment.   IV Levophed to titrate to effect.  Wean as tolerated   Handheld nebs, wean as tolerated.   IV Solu-Medrol, taper as tolerated.   PCCM has been consulted for pulmonary critical care management.   Additional imaging as per PCCM.   Monitor blood glucose.  Glucomander per protocol.   Continue on AC with SC Lovenox until able to  tolerate PO meds   Will check left upper extremity PVL in view of history of left upper extremity DVT with treatment on Eliquis.   Continue home medications when possible.  Further recommendations based on test results, response to treatment, and clinical course.          Chief Complaint:          Chief Complaint       Patient presents with        ?  Respiratory Distress        ?  Cough             HPI:        [name] [name] is a 70 y.o. female with hx of HTN, COPD, GERD, DM II, Hepatitis C, PTSD, and Chronic Pain Syndrome who presents to the ED via EMS with reported history of shortness of breath over the past several days.  Patient's symptoms became worse  in severity and EMS was called.  Patient was found to be minimally responsive on initial evaluation in the ED, and ABG revealed a pH of 7.201 and a PCO2 of 114.3.  Patient did not improve on BiPAP and patient spouse agreed to intubation.  Patient was  then emergently intubated in the ED.  HPI is otherwise unable to be obtained due to patient's medical condition.           Past Medical History:        Diagnosis  Date         ?  Arthritis       ?  Chronic pain syndrome            related to R hip replacement         ?  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  DM2 (diabetes mellitus, type 2) (HCC)       ?  GERD (gastroesophageal reflux disease)       ?  Hepatitis C            HEP C         ?  HTN (hypertension)       ?  Lung nodule, multiple            (CT 10/2016) Small left upper lobe and 1.7 x 1.6 cm left lower lobe nodules not significantly changed from 07/23/2016 and 03/22/2016         ?  Marijuana use       ?  PTSD (post-traumatic stress disorder)            lived through the [place]         ?  Thoracic ascending aortic aneurysm (HCC)            4.4cm noted on CT Chest (10/2016)         ?  Thyroid nodule            2.5 cm stable right thyroid nodule (CT 10/2016)              Past Surgical History:         Procedure  Laterality  Date          ?  HX ENDOSCOPY         ?  HX HERNIA REPAIR    2019          x2          ?  HX HIP REPLACEMENT  Right  01/29/2015     ?  HX HIP REPLACEMENT         ?  HX HYSTERECTOMY    2010          hysterectomy             Family History         Problem  Relation  Age of Onset          ?  Hypertension  Mother       ?  Other  Mother                OSA          ?  Cancer  Father                suspected leukemia per pt's spouse          ?  Cancer  Maternal Aunt            ?  Alcohol abuse  Maternal Grandfather               Social History          Socioeconomic History         ?  Marital status:  MARRIED       Tobacco Use         ?  Smoking status:  Some Days     ?  Smokeless tobacco:  Never        ?  Tobacco comments:             reported as quit smoking around 1990s per spouse       Substance and Sexual Activity         ?  Alcohol use:  No     ?  Drug use:  Yes              Types:  Marijuana             Comment: reports smoking last week-marijuana         ?  Sexual activity:  Never             Prior to Admission medications             Medication  Sig  Start Date  End Date  Taking?  Authorizing Provider            allopurinoL (ZYLOPRIM) 100 mg tablet  TAKE 1 TABLET TWICE DAILY  04/12/21      Burtis Junes R, PA-C     insulin aspart U-100 (NovoLOG U-100 Insulin aspart) 100 unit/mL injection  7 Units by SubCUTAneous route Before breakfast, lunch, and dinner.  01/28/21      [name], MD     insulin glargine (Lantus Solostar U-100 Insulin) 100 unit/mL (3 mL) inpn  25 Units by SubCUTAneous route nightly.  01/28/21      [name], MD     cloNIDine HCL (CATAPRES) 0.2 mg tablet  Take 1 Tablet by mouth two (2) times a day.  01/28/21      [name], MD     apixaban (Eliquis DVT-PE Treat 30D Start) 5 mg (74 tabs) starter pack  Take 10 mg (two 5 mg tablets) by mouth twice a day for 2 days till 8/20   Followed by 5 mg (one 5 mg tablet) by mouth twice a day starting  8/21  01/28/21      [name], MD            predniSONE (STERAPRED) 5 mg dose pack  See administration instruction per  dose pack  01/28/21      [name], MD            senna-docusate (PERICOLACE) 8.6-50 mg per tablet  Take 2 Tablets by mouth nightly.  01/28/21      [name], MD     losartan (COZAAR) 100 mg tablet  Take 1 Tablet  by mouth daily.  01/28/21      [name], MD     polyethylene glycol [facility]) 17 gram packet  Take 1 Packet by mouth daily as needed for Constipation. Indications: constipation  06/02/20      Scarlette Ar, MD     fluticasone propionate (FLONASE) 50 mcg/actuation nasal spray  2 Sprays by Both Nostrils route daily as needed for Rhinitis or Allergies. Indications: inflammation of the nose due to an allergy  06/02/20      Scarlette Ar, MD     butalbital-acetaminophen-caffeine (FIORICET, ESGIC) 50-325-40 mg per tablet  butalbital-acetaminophen-caffeine 50 mg-325 mg-40 mg tablet    TAKE 1 TABLET BY MOUTH EVERY 4 HOURS AS NEEDED FOR HEADACHE        Provider, Historical     potassium chloride SR (K-TAB) 20 mEq tablet  potassium chloride ER 20 mEq tablet,extended release    TAKE 1 TABLET BY MOUTH ONCE DAILY        Provider, Historical     acetaminophen (TYLENOL) 325 mg tablet  Take 2 Tabs by mouth every four (4) hours as needed for Pain.  07/20/19      Digeronimo, Gabriel Rung, PA-C     albuterol (PROVENTIL HFA, VENTOLIN HFA, PROAIR HFA) 90 mcg/actuation inhaler  Take 2 Puffs by inhalation every four (4) hours as needed for Wheezing.  07/20/19      Digeronimo, Gabriel Rung, PA-C     dilTIAZem CD (CARDIZEM CD) 240 mg ER capsule  Take 1 Cap by mouth daily.  09/07/18      [name], MD     cetirizine (ZYRTEC) 10 mg tablet  Take 1 Tab by mouth daily.  09/08/18      [name], MD     albuterol (ACCUNEB) 1.25 mg/3 mL nebu  USE 1 VIAL IN NEBULIZER EVERY 6 HOURS AS NEEDED FOR  SHORTNESS  OF  BREATH,  WHEEZING  05/30/18      [name], PA-C     metFORMIN ER (GLUCOPHAGE XR) 500 mg tablet  TAKE  1 TABLET TWICE DAILY  03/30/18      [name], PA-C     montelukast (SINGULAIR) 10 mg tablet  TAKE 1 TABLET EVERY DAY  03/26/18      [name], PA-C     magnesium oxide (MAG-OX) 400 mg tablet  Take 400 mg by mouth daily.        Provider, Historical     QUEtiapine SR (SEROQUEL XR) 150 mg sr tablet  Take 150 mg by mouth nightly as needed.        Provider, Historical     tiotropium bromide (SPIRIVA RESPIMAT) 2.5 mcg/actuation inhaler  Take 1 Puff by inhalation daily.  10/11/17      Holladay, Versie Starks, MD     fluticasone propion-salmeterol (ADVAIR DISKUS) 100-50 mcg/dose diskus inhaler  Take 1 Puff by inhalation two (2) times a day.  10/11/17      Holladay, Versie Starks, MD            rosuvastatin (CRESTOR) 5 mg tablet  TAKE 1 TABLET BY MOUTH NIGHTLY  07/13/17      Littie Deeds, MD             Allergies        Allergen  Reactions         ?  Chocolate [Cocoa]  Sneezing             Confirms allergy but reports "I  still eat it"           Review of Systems:      1) See above. 2) ROS is otherwise unable to be obtained due to patient's medical condition.             Physical Exam:         Visit Vitals      BP  (!) 143/91     Pulse  89     Temp  98.9 ??F (37.2 ??C)     Resp  19     Ht  5\' 7"  (1.702 m)     Wt  103 kg (227 lb)     SpO2  96%        BMI  35.55 kg/m??           No intake or output data in the 24 hours ending 05/11/21 2335      Physical Exam:      Unresponsive and intubated on the ventilator      HEENT: NC/AT, PERRLA, EOMI, dry mucous membranes, ET tube in place   Neck: No JVD no bruits, supple, nontender, no significant LAD   LYMPH: No supraclavicular or cervical or axillary nodes on both sides   Cardiovascular: Heart, RRR, no M, R, G   Lungs: Decreased breath sounds at bases, coarse upper airway sounds, no wheezes or crackles   Abd: Soft, non tender, not distended, No guarding, No rigidity, BS normal   NEURO:  Non focal, normal strength. CN II - XII intact bilaterally    Extrm: no leg edema    Skin: No  rashes or lesions         Labs Reviewed:          Recent Results (from the past 24 hour(s))     CBC WITH AUTOMATED DIFF          Collection Time: 05/11/21  6:00 PM         Result  Value  Ref Range            WBC  19.0 (H)  4.0 - 11.0 1000/mm3       RBC  4.47  3.60 - 5.20 M/uL       HGB  12.0 (L)  13.0 - 17.2 gm/dl       HCT  04.5  40.9 - 50.0 %       MCV  92.2  80.0 - 98.0 fL       MCH  26.8  25.4 - 34.6 pg       MCHC  29.1 (L)  30.0 - 36.0 gm/dl       PLATELET  811  914 - 450 1000/mm3       MPV  11.7 (H)  6.0 - 10.0 fL       RDW-SD  49.1 (H)  36.4 - 46.3         NRBC  0  0 - 0         IMMATURE GRANULOCYTES  1.3  0.0 - 3.0 %       NEUTROPHILS  65.4 (H)  34 - 64 %       LYMPHOCYTES  23.7 (L)  28 - 48 %       MONOCYTES  7.6  1 - 13 %       EOSINOPHILS  1.6  0 - 5 %       BASOPHILS  0.4  0 - 3 %  METABOLIC PANEL, BASIC          Collection Time: 05/11/21  6:00 PM         Result  Value  Ref Range            Potassium  5.7 (H)  3.5 - 5.1 mEq/L       Chloride  93 (L)  98 - 107 mEq/L       Sodium  137  136 - 145 mEq/L       CO2  36 (H)  20 - 31 mEq/L       Glucose  165 (H)  74 - 106 mg/dl       BUN  10  9 - 23 mg/dl       Creatinine  4.09  0.55 - 1.02 mg/dl       GFR est AA  >81.1          GFR est non-AA  >60          Calcium  9.3  8.7 - 10.4 mg/dl       Anion gap  8  5 - 15 mmol/L       NT-PRO BNP          Collection Time: 05/11/21  6:00 PM         Result  Value  Ref Range            NT pro-BNP  22  0 - 125         MAGNESIUM          Collection Time: 05/11/21  6:00 PM         Result  Value  Ref Range            Magnesium  1.9  1.6 - 2.6 mg/dL       LACTIC ACID          Collection Time: 05/11/21  6:00 PM         Result  Value  Ref Range            Lactic Acid  2.7 (H)  0.5 - 2.2 mmol/L       TROPONIN-HIGH SENSITIVITY          Collection Time: 05/11/21  6:00 PM         Result  Value  Ref Range            Troponin-High Sensitivity  <3  0 - 34 ng/L       PROCALCITONIN          Collection Time: 05/11/21  6:00 PM          Result  Value  Ref Range            PROCALCITONIN  <0.05  0.00 - 0.50 ng/ml       POC BLOOD GAS + LACTIC ACID          Collection Time: 05/11/21  6:39 PM         Result  Value  Ref Range            pH  7.213 (LL)  7.350 - 7.450         PCO2  113.5 (HH)  35.0 - 45.0 mm Hg       PO2  100.0  75 - 100 mm Hg       BICARBONATE  45.7 (HH)  18.0 - 26.0 mmol/L       O2 SAT  95.0  90 -  100 %       CO2, TOTAL  49.0 (H)  24 - 29 mmol/L       Lactic Acid  1.44  0.40 - 2.00 mmol/L       BASE EXCESS  18 (H)  -2 - 3 mmol/L       Sample type  Art          FIO2  40          SITE  R Radial          DEVICE  BiPAP          ALLENS TEST  Pass          IPAP/PIP  22          EPAP/CPAP/PEEP  12          Respiratory Rate  20          SET RATE  12          VTEXP  664          [id number]-[id number]          Collection Time: 05/11/21  8:00 PM       Specimen: NASOPHARYNGEAL SWAB         Result  Value  Ref Range            COVID-19  NEGATIVE  NEGATIVE         Influenza A PCR  NEGATIVE  NEGATIVE         Influenza B PCR  NEGATIVE  NEGATIVE         RSV by PCR  NEGATIVE  NEGATIVE         POC CHEM8          Collection Time: 05/11/21  8:21 PM         Result  Value  Ref Range            Sodium  136  136 - 145 mEq/L       Potassium  5.5 (H)  3.5 - 4.9 mEq/L       Chloride  90 (L)  98 - 107 mEq/L       CO2, TOTAL  44 (HH)  21 - 32 mmol/L       Glucose  162 (H)  74 - 106 mg/dL       BUN  20  7 - 25 mg/dl       Creatinine  0.8  0.6 - 1.3 mg/dl       HCT  43  38 - 45 %       HGB  14.6  12.4 - 17.2 gm/dl       CALCIUM,IONIZED  4.40  4.40 - 5.40 mg/dL       POC BLOOD GAS + LACTIC ACID          Collection Time: 05/11/21  8:21 PM         Result  Value  Ref Range            pH  7.201 (LL)  7.350 - 7.450         PCO2  114.3 (HH)  35.0 - 45.0 mm Hg       PO2  59.0 (L)  75 - 100 mm Hg       BICARBONATE  44.9 (HH)  18.0 - 26.0 mmol/L       O2 SAT  81.0 (L)  90 - 100 %  CO2, TOTAL  48.0 (H)  24 - 29 mmol/L       Lactic Acid  0.90  0.40 - 2.00 mmol/L       BASE EXCESS   17 (H)  -2 - 3 mmol/L       Patient temp.  98.0 F          Sample type  Art          FIO2  40          SITE  L Radial          DEVICE  BiPAP          ALLENS TEST  Pass          IPAP/PIP  22          EPAP/CPAP/PEEP  12          Respiratory Rate  20          SET RATE  12          VTEXP  300          POC BLOOD GAS + LACTIC ACID          Collection Time: 05/11/21  9:55 PM         Result  Value  Ref Range            pH  7.192 (LL)  7.350 - 7.450         PCO2  114.2 (HH)  35.0 - 45.0 mm Hg       PO2  70.0 (L)  75 - 100 mm Hg       BICARBONATE  44.1 (HH)  18.0 - 26.0 mmol/L       O2 SAT  87.0 (L)  90 - 100 %       CO2, TOTAL  48.0 (H)  24 - 29 mmol/L       Lactic Acid  0.92  0.40 - 2.00 mmol/L       BASE EXCESS  16 (H)  -2 - 3 mmol/L       Patient temp.  98.0 F          Sample type  Art          FIO2  50          SITE  L Radial          DEVICE  BiPAP          ALLENS TEST  Pass          IPAP/PIP  24          EPAP/CPAP/PEEP  12          Respiratory Rate  20          SET RATE  16               VTEXP  337                      Diagnostic:        XR CHEST SNGL V      Result Date: 05/11/2021   EXAM: XR CHEST SNGL V INDICATION: SOB  COMPARISON: 01/24/2021 WORKSTATION ID: [id number] TECHNIQUE: Frontal view. FINDINGS: SUPPORT DEVICES: None. LUNGS/PLEURA: No consolidation or pleural effusion. Stable bullous changes in the left upper lobe. Unchanged  rounded calcified nodule in the left lung. HEART/MEDIASTINUM: Cardiomediastinal silhouette is stable in appearance. OTHER: None.       IMPRESSION: No acute cardiopulmonary process.  Electronically signed by: Albin Felling, MD 05/11/2021 6:18 PM EST       CT CHEST W CON PE PROTOCOL      Result Date: 05/11/2021   All CT exams at this facility use one or more dose reduction techniques including automatic exposure control, mA/kV adjustment per patient's size, or iterative reconstruction technique. Clinical History: rule out PE.   Examination: CTA CHEST with contrast.  3 mm spiral scanning  was performed from the lung apices to the upper pole of the kidneys. 3D MIP coronal and sagittal reconstruction imaging has been performed.  Correlation: 08/30/2018, 02/18/2018 and 07/03/2015 Findings: Degenerative changes of the spine.  Secretions dependent portions of the trachea. Dense airspace opacity with air bronchograms left upper lobe. There are bilateral bullae. Dependent atelectasis lung bases. 2 cm left lower lobe nodule slightly increased in size from 08/30/2018, 02/18/2018 and  07/02/2025 ((prior measurement 1.7, 1.9 and 1.3 cm). No dissection thoracic aorta. Ascending aorta measures 4.7 cm unchanged from prior studies. The esophagus is not dilated. No lymph node enlargement in the axilla, mediastinum or hila. No pulmonary embolism.  Visualized portions of the liver, pancreas, adrenal glands are unremarkable. 2.3 cm splenic lesion unchanged from 02/18/2018. 1.8 cm midpole right renal hypodensity, ultrasound characterization suggested.       IMPRESSION: 1. No pulmonary embolism. 2. Bilateral bullae 3. Left upper lobe pneumonia/volume loss. Radiographic follow-up to resolution suggested. 4. 2 cm left lower lobe nodule not significantly changed from prior studies. 5. 2.3 cm stable splenic lesion,  unchanged from 02/18/2018. 6. 1.8 cm probable cyst midpole right kidney, ultrasound characterization suggested. Electronically signed by: Jolayne Panther, MD 05/11/2021 10:11 PM EST        Dragon medical dictation software was used for portions of this report.  Efforts have been made to edit the dictations, but occasionally words are mis-transcribed.     Critical Care time: 58 minutes      Otilio Carpen, MD   05/11/2021   11:35 PM
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
## 2021-05-11 NOTE — ED Notes (Signed)
Report given to Jared RN

## 2021-05-12 LAB — POC BLOOD GAS + LACTIC ACID
BASE EXCESS: 17 mmol/L — ABNORMAL HIGH (ref <=3)
BASE EXCESS: 16 mmol/L — ABNORMAL HIGH (ref <=3)
BASE EXCESS: 18 mmol/L — ABNORMAL HIGH (ref <=3)
BASE EXCESS: 16 mmol/L — ABNORMAL HIGH (ref <=3)
BASE EXCESS: 16 mmol/L — ABNORMAL HIGH (ref <=3)
BICARBONATE: 44.9 mmol/L — CR (ref 18.0–26.0)
BICARBONATE: 44.1 mmol/L — CR (ref 18.0–26.0)
BICARBONATE: 45.2 mmol/L — CR (ref 18.0–26.0)
BICARBONATE: 40.5 mmol/L — CR (ref 18.0–26.0)
BICARBONATE: 39.5 mmol/L — CR (ref 18.0–26.0)
Base Excess: 17 mmol/L — ABNORMAL HIGH (ref <=3)
Base Excess: 16 mmol/L — ABNORMAL HIGH (ref <=3)
Base Excess: 18 mmol/L — ABNORMAL HIGH (ref <=3)
Base Excess: 16 mmol/L — ABNORMAL HIGH (ref <=3)
Base Excess: 16 mmol/L — ABNORMAL HIGH (ref <=3)
CO2 Total: 48 mmol/L — ABNORMAL HIGH (ref 24–29)
CO2 Total: 48 mmol/L — ABNORMAL HIGH (ref 24–29)
CO2 Total: 48 mmol/L — ABNORMAL HIGH (ref 24–29)
CO2 Total: 42 mmol/L — ABNORMAL HIGH (ref 24–29)
CO2 Total: 41 mmol/L — ABNORMAL HIGH (ref 24–29)
CO2, TOTAL: 48 mmol/L — ABNORMAL HIGH (ref 24–29)
CO2, TOTAL: 48 mmol/L — ABNORMAL HIGH (ref 24–29)
CO2, TOTAL: 48 mmol/L — ABNORMAL HIGH (ref 24–29)
CO2, TOTAL: 42 mmol/L — ABNORMAL HIGH (ref 24–29)
CO2, TOTAL: 41 mmol/L — ABNORMAL HIGH (ref 24–29)
EPAP/CPAP/PEEP: 12
EPAP/CPAP/PEEP: 12
EPAP/CPAP/PEEP: 12
EPAP/CPAP/PEEP: 12
FIO2: 40
FIO2: 40
FIO2: 50
FIO2: 50
FIO2: 100
FIO2: 100
FIO2: 60
FIO2: 60
FIO2: 40
FIO2: 40
HCO3: 44.9 mmol/L (ref 18.0–26.0)
HCO3: 44.1 mmol/L (ref 18.0–26.0)
HCO3: 45.2 mmol/L (ref 18.0–26.0)
HCO3: 40.5 mmol/L (ref 18.0–26.0)
HCO3: 39.5 mmol/L (ref 18.0–26.0)
IPAP/PIP: 22
IPAP/PIP: 22
IPAP/PIP: 24
IPAP/PIP: 24
LACTIC ACID: 0.9 mmol/L (ref 0.40–2.00)
LACTIC ACID: 0.92 mmol/L (ref 0.40–2.00)
LACTIC ACID: 1.54 mmol/L (ref 0.40–2.00)
LACTIC ACID: 1.94 mmol/L (ref 0.40–2.00)
LACTIC ACID: 0.78 mmol/L (ref 0.40–2.00)
Lactic Acid: 0.9 mmol/L (ref 0.40–2.00)
Lactic Acid: 0.92 mmol/L (ref 0.40–2.00)
Lactic Acid: 1.54 mmol/L (ref 0.40–2.00)
Lactic Acid: 1.94 mmol/L (ref 0.40–2.00)
Lactic Acid: 0.78 mmol/L (ref 0.40–2.00)
O2 SAT: 81 % — ABNORMAL LOW (ref 90–100)
O2 SAT: 87 % — ABNORMAL LOW (ref 90–100)
O2 SAT: 99 % (ref 90–100)
O2 SAT: 92 % (ref 90–100)
O2 SAT: 86 % — ABNORMAL LOW (ref 90–100)
O2 Sat: 81 % — ABNORMAL LOW (ref 90–100)
O2 Sat: 87 % — ABNORMAL LOW (ref 90–100)
O2 Sat: 99 % (ref 90–100)
O2 Sat: 92 % (ref 90–100)
O2 Sat: 86 % — ABNORMAL LOW (ref 90–100)
PCO2: 114.3 mm Hg (ref 35.0–45.0)
PCO2: 114.3 mm Hg — CR (ref 35.0–45.0)
PCO2: 114.2 mm Hg (ref 35.0–45.0)
PCO2: 114.2 mm Hg — CR (ref 35.0–45.0)
PCO2: 95.3 mm Hg (ref 35.0–45.0)
PCO2: 95.3 mm Hg — CR (ref 35.0–45.0)
PCO2: 64.4 mm Hg (ref 35.0–45.0)
PCO2: 64.4 mm Hg — CR (ref 35.0–45.0)
PCO2: 54.4 mm Hg — ABNORMAL HIGH (ref 35.0–45.0)
PCO2: 54.4 mm Hg — ABNORMAL HIGH (ref 35.0–45.0)
PEEP/CPAP: 5
PEEP/CPAP: 5
PEEP/CPAP: 5
PO2: 59 mm Hg — ABNORMAL LOW (ref 75–100)
PO2: 59 mm Hg — ABNORMAL LOW (ref 75–100)
PO2: 70 mm Hg — ABNORMAL LOW (ref 75–100)
PO2: 70 mm Hg — ABNORMAL LOW (ref 75–100)
PO2: 169 mm Hg — ABNORMAL HIGH (ref 75–100)
PO2: 169 mm Hg — ABNORMAL HIGH (ref 75–100)
PO2: 66 mm Hg — ABNORMAL LOW (ref 75–100)
PO2: 66 mm Hg — ABNORMAL LOW (ref 75–100)
PO2: 51 mm Hg — CL (ref 75–100)
PO2: 51 mm Hg — CL (ref 75–100)
PRESSURE CONTROL: 15
Patient temp.: 98
Patient temp.: 98
Patient temp.: 98
Patient temp.: 37.2
Patient temp.: 99
Patient temperature: 98
Patient temperature: 98
Patient temperature: 98
Patient temperature: 37.2
Patient temperature: 99
Peep/Cpap: 5
Peep/Cpap: 5
Peep/Cpap: 5
Pressure control: 15
RESPIRATORY RATE: 20
RESPIRATORY RATE: 20
RESPIRATORY RATE: 14
RESPIRATORY RATE: 12
RESPIRATORY RATE: 12
Respiratory Rate: 20
Respiratory Rate: 20
Respiratory Rate: 14
Respiratory Rate: 12
Respiratory Rate: 12
SET RATE: 12
SET RATE: 16
SET RATE: 14
SET RATE: 12
SET RATE: 12
SETVT, SET VT: 530
SETVT, SET VT: 450
SETVT: 530
SETVT: 450
Set Rate: 12
Set Rate: 16
Set Rate: 14
Set Rate: 12
Set Rate: 12
VTEXP, VTEXP: 300
VTEXP, VTEXP: 337
VTEXP, VTEXP: 490
VTEXP, VTEXP: 507
VTEXP, VTEXP: 474
VTEXP: 300
VTEXP: 337
VTEXP: 490
VTEXP: 507
VTEXP: 474
pH: 7.201 — CL (ref 7.350–7.450)
pH: 7.201 — CL (ref 7.350–7.450)
pH: 7.192 — CL (ref 7.350–7.450)
pH: 7.192 — CL (ref 7.350–7.450)
pH: 7.282 — CL (ref 7.350–7.450)
pH: 7.282 — CL (ref 7.350–7.450)
pH: 7.407 (ref 7.350–7.450)
pH: 7.407 (ref 7.350–7.450)
pH: 7.47 — ABNORMAL HIGH (ref 7.350–7.450)
pH: 7.47 — ABNORMAL HIGH (ref 7.350–7.450)

## 2021-05-12 LAB — COMPREHENSIVE METABOLIC PANEL
ALT: <7 U/L — ABNORMAL LOW (ref 10–49)
AST: 10 U/L (ref 0.0–33.9)
Albumin: 3.3 gm/dl — ABNORMAL LOW (ref 3.4–5.0)
Alkaline Phosphatase: 75 U/L (ref 46–116)
Anion Gap: 6 mmol/L (ref 5–15)
BUN: 18 mg/dl (ref 9–23)
CO2: 36 mEq/L — ABNORMAL HIGH (ref 20–31)
Calcium: 9 mg/dl (ref 8.7–10.4)
Chloride: 95 mEq/L — ABNORMAL LOW (ref 98–107)
Creatinine: 0.67 mg/dl (ref 0.55–1.02)
EGFR IF NonAfrican American: >60
GFR African American: >60
Glucose: 208 mg/dl — ABNORMAL HIGH (ref 74–106)
Potassium: 5.6 mEq/L — ABNORMAL HIGH (ref 3.5–5.1)
Sodium: 137 mEq/L (ref 136–145)
Total Bilirubin: 0.4 mg/dl (ref 0.30–1.20)
Total Protein: 6.7 gm/dl (ref 5.7–8.2)

## 2021-05-12 LAB — URINALYSIS W/ RFLX MICROSCOPIC
Bilirubin, Urine: NEGATIVE
Bilirubin: NEGATIVE
Blood, Urine: NEGATIVE
Blood: NEGATIVE
Glucose, Ur: NEGATIVE mg/dl
Glucose: NEGATIVE mg/dl
Ketone: NEGATIVE mg/dl
Ketones, Urine: NEGATIVE mg/dl
Leukocyte Esterase, Urine: NEGATIVE
Leukocyte Esterase: NEGATIVE
Nitrite, Urine: NEGATIVE
Nitrites: NEGATIVE
Protein, UA: 30 mg/dl — AB
Protein: 30 mg/dl — AB
Specific Gravity, UA: 1.01 (ref 1.005–1.030)
Specific gravity: 1.01 (ref 1.005–1.030)
Urobilinogen, UA, POCT: 0.2 mg/dl (ref 0.0–1.0)
Urobilinogen: 0.2 mg/dl (ref 0.0–1.0)
pH (UA): 7.5 (ref 5.0–9.0)
pH, UA: 7.5 (ref 5.0–9.0)

## 2021-05-12 LAB — POC CHEM8
BUN: 20 mg/dl (ref 7–25)
BUN: 20 mg/dl (ref 7–25)
CALCIUM,IONIZED: 4.4 mg/dL (ref 4.40–5.40)
CALCIUM,IONIZED: 4.4 mg/dL (ref 4.40–5.40)
CO2 Total: 44 mmol/L (ref 21–32)
CO2, TOTAL: 44 mmol/L — CR (ref 21–32)
Chloride: 90 mEq/L — ABNORMAL LOW (ref 98–107)
Chloride: 90 mEq/L — ABNORMAL LOW (ref 98–107)
Creatinine: 0.8 mg/dl (ref 0.6–1.3)
Creatinine: 0.8 mg/dl (ref 0.6–1.3)
Glucose: 162 mg/dL — ABNORMAL HIGH (ref 74–106)
Glucose: 162 mg/dL — ABNORMAL HIGH (ref 74–106)
HCT: 43 % (ref 38–45)
HGB: 14.6 gm/dl (ref 12.4–17.2)
Hematocrit: 43 % (ref 38–45)
Hemoglobin: 14.6 gm/dl (ref 12.4–17.2)
Potassium: 5.5 mEq/L — ABNORMAL HIGH (ref 3.5–4.9)
Potassium: 5.5 mEq/L — ABNORMAL HIGH (ref 3.5–4.9)
Sodium: 136 mEq/L (ref 136–145)
Sodium: 136 mEq/L (ref 136–145)

## 2021-05-12 LAB — EKG 12-LEAD
Atrial Rate: 91 {beats}/min
P Axis: 66 degrees
P-R Interval: 164 ms
Q-T Interval: 356 ms
QRS Duration: 70 ms
QTc Calculation (Bazett): 437 ms
R Axis: 10 degrees
T Axis: 35 degrees
Ventricular Rate: 91 {beats}/min

## 2021-05-12 LAB — CBC WITH AUTO DIFFERENTIAL
Basophils %: 0.2 % (ref 0–3)
Eosinophils %: 0.1 % (ref 0–5)
Hematocrit: 38.4 % (ref 37.0–50.0)
Hemoglobin: 11.2 gm/dl — ABNORMAL LOW (ref 13.0–17.2)
Immature Granulocytes: 0.6 % (ref 0.0–3.0)
Lymphocytes %: 3.3 % — ABNORMAL LOW (ref 28–48)
MCH: 26.9 pg (ref 25.4–34.6)
MCHC: 29.2 gm/dl — ABNORMAL LOW (ref 30.0–36.0)
MCV: 92.1 fL (ref 80.0–98.0)
MPV: 11.2 fL — ABNORMAL HIGH (ref 6.0–10.0)
Monocytes %: 0.9 % — ABNORMAL LOW (ref 1–13)
Neutrophils %: 94.9 % — ABNORMAL HIGH (ref 34–64)
Nucleated RBCs: 0 (ref 0–0)
Platelets: 200 10*3/uL (ref 140–450)
RBC: 4.17 M/uL (ref 3.60–5.20)
RDW-SD: 49.3 — ABNORMAL HIGH (ref 36.4–46.3)
WBC: 18.6 10*3/uL — ABNORMAL HIGH (ref 4.0–11.0)

## 2021-05-12 LAB — POCT GLUCOSE
POC Glucose: 228 mg/dL — ABNORMAL HIGH (ref 65–105)
POC Glucose: 210 mg/dL — ABNORMAL HIGH (ref 65–105)
POC Glucose: 245 mg/dL — ABNORMAL HIGH (ref 65–105)
POC Glucose: 185 mg/dL — ABNORMAL HIGH (ref 65–105)

## 2021-05-12 LAB — DRUG SCREEN, URINE
Amphetamine: NEGATIVE
Amphetamines: NEGATIVE
Barbiturates: NEGATIVE
Barbiturates: NEGATIVE
Benzodiazapines: POSITIVE — AB
Benzodiazepines: POSITIVE — AB
Cocaine: NEGATIVE
Cocaine: NEGATIVE
Marijuana: POSITIVE — AB
Marijuana: POSITIVE — AB
Methadone: NEGATIVE
Methadone: NEGATIVE
Opiates: NEGATIVE
Opiates: NEGATIVE
Phencyclidine: NEGATIVE
Phencyclidine: NEGATIVE

## 2021-05-12 LAB — BASIC METABOLIC PANEL
Anion Gap: 6 mmol/L (ref 5–15)
BUN: 15 mg/dl (ref 9–23)
CO2: 35 mEq/L — ABNORMAL HIGH (ref 20–31)
Calcium: 9.5 mg/dl (ref 8.7–10.4)
Chloride: 97 mEq/L — ABNORMAL LOW (ref 98–107)
Creatinine: 0.63 mg/dl (ref 0.55–1.02)
EGFR IF NonAfrican American: >60
GFR African American: >60
Glucose: 201 mg/dl — ABNORMAL HIGH (ref 74–106)
Potassium: 5 mEq/L (ref 3.5–5.1)
Sodium: 138 mEq/L (ref 136–145)

## 2021-05-12 LAB — MRSA SCREEN - PCR (NASAL)
MRSA PCR SCREEN: NEGATIVE
MRSA PCR screen: NEGATIVE

## 2021-05-12 LAB — POC URINE MICROSCOPIC

## 2021-05-12 LAB — PROTIME-INR
INR: 1.4 — ABNORMAL HIGH (ref 0.1–1.1)
Protime: 16.2 seconds — ABNORMAL HIGH (ref 10.2–12.9)

## 2021-05-12 LAB — S.PNEUMO AG, UR/CSF
Strep pneumo Ag, urine: NEGATIVE
Strep pneumo Ag, urine: NEGATIVE

## 2021-05-12 LAB — PROCALCITONIN
PROCALCITONIN: <0.05 ng/ml (ref 0.00–0.50)
PROCALCITONIN: <0.05 ng/ml (ref 0.00–0.50)

## 2021-05-12 LAB — MAGNESIUM
Magnesium: 2.1 mg/dL (ref 1.6–2.6)
Magnesium: 2.1 mg/dL (ref 1.6–2.6)

## 2021-05-12 LAB — LEGIONELLA ANTIGEN, URINE: Legionella Antigen, Urine: NEGATIVE

## 2021-05-12 LAB — GLUCOSE, POC
Glucose (POC): 228 mg/dL — ABNORMAL HIGH (ref 65–105)
Glucose (POC): 210 mg/dL — ABNORMAL HIGH (ref 65–105)
Glucose (POC): 245 mg/dL — ABNORMAL HIGH (ref 65–105)
Glucose (POC): 185 mg/dL — ABNORMAL HIGH (ref 65–105)

## 2021-05-12 LAB — METABOLIC PANEL, COMPREHENSIVE
ALT (SGPT): <7 U/L — ABNORMAL LOW (ref 10–49)
AST (SGOT): 10 U/L (ref 0.0–33.9)
Albumin: 3.3 gm/dl — ABNORMAL LOW (ref 3.4–5.0)
Alk. phosphatase: 75 U/L (ref 46–116)
Anion gap: 6 mmol/L (ref 5–15)
BUN: 18 mg/dl (ref 9–23)
Bilirubin, total: 0.4 mg/dl (ref 0.30–1.20)
CO2: 36 mEq/L — ABNORMAL HIGH (ref 20–31)
Calcium: 9 mg/dl (ref 8.7–10.4)
Chloride: 95 mEq/L — ABNORMAL LOW (ref 98–107)
Creatinine: 0.67 mg/dl (ref 0.55–1.02)
GFR est AA: >60
GFR est non-AA: >60
Glucose: 208 mg/dl — ABNORMAL HIGH (ref 74–106)
Potassium: 5.6 mEq/L — ABNORMAL HIGH (ref 3.5–5.1)
Protein, total: 6.7 gm/dl (ref 5.7–8.2)
Sodium: 137 mEq/L (ref 136–145)

## 2021-05-12 LAB — CBC WITH AUTOMATED DIFF
BASOPHILS: 0.2 % (ref 0–3)
EOSINOPHILS: 0.1 % (ref 0–5)
HCT: 38.4 % (ref 37.0–50.0)
HGB: 11.2 gm/dl — ABNORMAL LOW (ref 13.0–17.2)
IMMATURE GRANULOCYTES: 0.6 % (ref 0.0–3.0)
LYMPHOCYTES: 3.3 % — ABNORMAL LOW (ref 28–48)
MCH: 26.9 pg (ref 25.4–34.6)
MCHC: 29.2 gm/dl — ABNORMAL LOW (ref 30.0–36.0)
MCV: 92.1 fL (ref 80.0–98.0)
MONOCYTES: 0.9 % — ABNORMAL LOW (ref 1–13)
MPV: 11.2 fL — ABNORMAL HIGH (ref 6.0–10.0)
NEUTROPHILS: 94.9 % — ABNORMAL HIGH (ref 34–64)
NRBC: 0 (ref 0–0)
PLATELET: 200 10*3/uL (ref 140–450)
RBC: 4.17 M/uL (ref 3.60–5.20)
RDW-SD: 49.3 — ABNORMAL HIGH (ref 36.4–46.3)
WBC: 18.6 10*3/uL — ABNORMAL HIGH (ref 4.0–11.0)

## 2021-05-12 LAB — METABOLIC PANEL, BASIC
Anion gap: 6 mmol/L (ref 5–15)
BUN: 15 mg/dl (ref 9–23)
CO2: 35 mEq/L — ABNORMAL HIGH (ref 20–31)
Calcium: 9.5 mg/dl (ref 8.7–10.4)
Chloride: 97 mEq/L — ABNORMAL LOW (ref 98–107)
Creatinine: 0.63 mg/dl (ref 0.55–1.02)
GFR est AA: >60
GFR est non-AA: >60
Glucose: 201 mg/dl — ABNORMAL HIGH (ref 74–106)
Potassium: 5 mEq/L (ref 3.5–5.1)
Sodium: 138 mEq/L (ref 136–145)

## 2021-05-12 LAB — SARS-COV-2_FLU_RSV
COVID-19: NEGATIVE
Influenza A PCR: NEGATIVE
Influenza B PCR: NEGATIVE
RSV by PCR: NEGATIVE

## 2021-05-12 LAB — EKG, 12 LEAD, INITIAL
Atrial Rate: 91 {beats}/min
Calculated P Axis: 66 degrees
Calculated R Axis: 10 degrees
Calculated T Axis: 35 degrees
P-R Interval: 164 ms
Q-T Interval: 356 ms
QRS Duration: 70 ms
QTC Calculation (Bezet): 437 ms
Ventricular Rate: 91 {beats}/min

## 2021-05-12 LAB — LEGIONELLA PNEUMOPHILA AG, URINE: Legionella Ag, urine: NEGATIVE

## 2021-05-12 LAB — PROTHROMBIN TIME + INR
INR: 1.4 — ABNORMAL HIGH (ref 0.1–1.1)
Prothrombin time: 16.2 seconds — ABNORMAL HIGH (ref 10.2–12.9)

## 2021-05-12 MED ORDER — ELECTROLYTE REPLACEMENT PROTOCOL
Status: AC | PRN
Start: 2021-05-12 — End: 2021-05-20

## 2021-05-12 MED ORDER — PIPERACILLIN-TAZOBACTAM 3.375 GRAM IV SOLR
3.375 gram | Freq: Three times a day (TID) | INTRAVENOUS | Status: DC
Start: 2021-05-12 — End: 2021-05-21
  Administered 2021-05-12 – 2021-05-21 (×28): via INTRAVENOUS

## 2021-05-12 MED ORDER — MIDAZOLAM (PF) 1 MG/ML IN NS IV INFUSION
1 mg/mL | INTRAVENOUS | Status: AC
Start: 2021-05-12 — End: 2021-05-12
  Administered 2021-05-12 (×3): via INTRAVENOUS

## 2021-05-12 MED ORDER — INSULIN REGULAR HUMAN 100 UNIT/ML INJECTION
100 unit/mL | INTRAMUSCULAR | Status: AC
Start: 2021-05-12 — End: 2021-05-12
  Administered 2021-05-12: 15:00:00 via INTRAVENOUS

## 2021-05-12 MED ORDER — WHITE PETROLATUM-MINERAL OIL 56.8 %-42.5 % EYE OINTMENT
Freq: Every day | OPHTHALMIC | Status: AC
Start: 2021-05-12 — End: 2021-05-12

## 2021-05-12 MED ORDER — VANCOMYCIN IN 0.9 % SODIUM CHLORIDE 1.25 GRAM/250 ML IV
1.25 gram/250 mL | Freq: Two times a day (BID) | INTRAVENOUS | Status: AC
Start: 2021-05-12 — End: 2021-05-15
  Administered 2021-05-12 – 2021-05-15 (×6): via INTRAVENOUS

## 2021-05-12 MED ORDER — SODIUM CHLORIDE 0.9 % IV
50 mcg/mL | INTRAVENOUS | Status: DC
Start: 2021-05-12 — End: 2021-05-11
  Administered 2021-05-12: 09:00:00 via INTRAVENOUS

## 2021-05-12 MED ORDER — DEXTROSE 50% IN WATER (D50W) IV SYRG
INTRAVENOUS | Status: AC | PRN
Start: 2021-05-12 — End: 2021-05-19

## 2021-05-12 MED ORDER — NOREPINEPHRINE BITARTRATE 8 MG/250 ML (32 MCG/ML) IN 0.9 % NACL IV
825032 mg/250 mL (32 mcg/mL) | INTRAVENOUS | Status: DC
Start: 2021-05-12 — End: 2021-05-13
  Administered 2021-05-12: 05:00:00 via INTRAVENOUS

## 2021-05-12 MED ORDER — ELECTROLYTE REPLACEMENT PROTOCOL
Status: AC | PRN
Start: 2021-05-12 — End: 2021-05-20
  Administered 2021-05-13: 19:00:00

## 2021-05-12 MED ORDER — ROCURONIUM 10 MG/ML IV
10 mg/mL | INTRAVENOUS | Status: AC
Start: 2021-05-12 — End: 2021-05-12
  Administered 2021-05-12: 05:00:00 via INTRAVENOUS

## 2021-05-12 MED ORDER — BUDESONIDE 0.5 MG/2 ML NEB SUSPENSION
0.5 mg/2 mL | Freq: Two times a day (BID) | RESPIRATORY_TRACT | Status: AC
Start: 2021-05-12 — End: 2021-05-17
  Administered 2021-05-12 – 2021-05-17 (×12): via RESPIRATORY_TRACT

## 2021-05-12 MED ORDER — DOCUSATE SODIUM 50 MG/5 ML ORAL LIQUID
50 mg/5 mL | Freq: Two times a day (BID) | ORAL | Status: AC
Start: 2021-05-12 — End: 2021-05-26
  Administered 2021-05-13 – 2021-05-25 (×23): via ORAL

## 2021-05-12 MED ORDER — GLUCAGON 1 MG INJECTION
1 mg | INTRAMUSCULAR | Status: DC | PRN
Start: 2021-05-12 — End: 2021-05-19

## 2021-05-12 MED ORDER — MONTELUKAST 10 MG TAB
10 mg | Freq: Every evening | ORAL | Status: AC
Start: 2021-05-12 — End: 2021-05-26
  Administered 2021-05-13 – 2021-05-25 (×14): via ORAL

## 2021-05-12 MED ORDER — FENTANYL CITRATE (PF) 50 MCG/ML IJ SOLN
50 mcg/mL | INTRAMUSCULAR | Status: DC | PRN
Start: 2021-05-12 — End: 2021-05-11

## 2021-05-12 MED ORDER — SODIUM CHLORIDE 0.9% BOLUS IV
0.9 % | Freq: Once | INTRAVENOUS | Status: AC
Start: 2021-05-12 — End: 2021-05-12
  Administered 2021-05-12: 05:00:00 via INTRAVENOUS

## 2021-05-12 MED ORDER — DEXMEDETOMIDINE 200 MCG/50 ML (4 MCG/ML) IN 0.9 % SODIUM CHLORIDE IV
200504 mcg/50 mL (4 mcg/mL) | INTRAVENOUS | Status: DC
Start: 2021-05-12 — End: 2021-05-13
  Administered 2021-05-12 (×7): via INTRAVENOUS

## 2021-05-12 MED ORDER — FENTANYL IN NS (PF) 10 MCG/ML INFUSION
10 mcg/mL | INTRAVENOUS | Status: DC
Start: 2021-05-12 — End: 2021-05-13
  Administered 2021-05-12 – 2021-05-13 (×10): via INTRAVENOUS

## 2021-05-12 MED ORDER — ENOXAPARIN 100 MG/ML SUB-Q SYRINGE
100 mg/mL | Freq: Two times a day (BID) | SUBCUTANEOUS | Status: AC
Start: 2021-05-12 — End: 2021-05-14
  Administered 2021-05-12 – 2021-05-14 (×5): via SUBCUTANEOUS

## 2021-05-12 MED ORDER — SODIUM CHLORIDE 0.9 % INJECTION
40 mg | Freq: Every day | INTRAMUSCULAR | Status: AC
Start: 2021-05-12 — End: 2021-05-25
  Administered 2021-05-12 – 2021-05-25 (×12): via INTRAVENOUS

## 2021-05-12 MED ORDER — PROPOFOL INFUSION
10 mg/mL | INTRAVENOUS | Status: DC
Start: 2021-05-12 — End: 2021-05-11

## 2021-05-12 MED ORDER — PROPOFOL INFUSION
10 mg/mL | INTRAVENOUS | Status: AC
Start: 2021-05-12 — End: 2021-05-13
  Administered 2021-05-12 – 2021-05-13 (×8): via INTRAVENOUS

## 2021-05-12 MED ORDER — GLUCAGON 1 MG INJECTION
1 mg | INTRAMUSCULAR | Status: AC | PRN
Start: 2021-05-12 — End: 2021-05-12

## 2021-05-12 MED ORDER — ENOXAPARIN 40 MG/0.4 ML SUB-Q SYRINGE
40 mg/0.4 mL | Freq: Every day | SUBCUTANEOUS | Status: DC
Start: 2021-05-12 — End: 2021-05-12

## 2021-05-12 MED ORDER — INSULIN GLARGINE 100 UNIT/ML INJECTION
100 unit/mL | Freq: Every evening | SUBCUTANEOUS | Status: DC
Start: 2021-05-12 — End: 2021-05-19
  Administered 2021-05-13 – 2021-05-19 (×7): via SUBCUTANEOUS

## 2021-05-12 MED ORDER — SODIUM CHLORIDE 3 % NEB SOLUTION
3 % | Freq: Two times a day (BID) | RESPIRATORY_TRACT | Status: DC
Start: 2021-05-12 — End: 2021-05-14
  Administered 2021-05-13 – 2021-05-14 (×4): via RESPIRATORY_TRACT

## 2021-05-12 MED ORDER — PHARMACY VANCOMYCIN NOTE
Status: AC
Start: 2021-05-12 — End: 2021-05-15

## 2021-05-12 MED ORDER — DEXTROSE 50% IN WATER (D50W) IV SYRG
INTRAVENOUS | Status: AC
Start: 2021-05-12 — End: 2021-05-12
  Administered 2021-05-12: 14:00:00 via INTRAVENOUS

## 2021-05-12 MED ORDER — VANCOMYCIN IN 0.9 % SODIUM CHLORIDE 2 GRAM/500 ML IV
2 gram/500 mL | Freq: Once | INTRAVENOUS | Status: AC
Start: 2021-05-12 — End: 2021-05-12
  Administered 2021-05-12: 07:00:00 via INTRAVENOUS

## 2021-05-12 MED ORDER — IPRATROPIUM-ALBUTEROL 2.5 MG-0.5 MG/3 ML NEB SOLUTION
2.5 mg-0.5 mg/3 ml | RESPIRATORY_TRACT | Status: AC
Start: 2021-05-12 — End: 2021-05-11
  Administered 2021-05-12: 03:00:00 via RESPIRATORY_TRACT

## 2021-05-12 MED ORDER — ETOMIDATE 2 MG/ML IV SOLN
2 mg/mL | Freq: Once | INTRAVENOUS | Status: AC
Start: 2021-05-12 — End: 2021-05-12
  Administered 2021-05-12: 05:00:00 via INTRAVENOUS

## 2021-05-12 MED ORDER — SODIUM CHLORIDE 0.9 % IV PIGGY BACK
4.5 gram | Freq: Once | INTRAVENOUS | Status: AC
Start: 2021-05-12 — End: 2021-05-12
  Administered 2021-05-12: 09:00:00 via INTRAVENOUS

## 2021-05-12 MED ORDER — IOPAMIDOL 76 % IV SOLN
37076 mg iodine /mL (76 %) | Freq: Once | INTRAVENOUS | Status: AC
Start: 2021-05-12 — End: 2021-05-11
  Administered 2021-05-12: 03:00:00 via INTRAVENOUS

## 2021-05-12 MED ORDER — CALCIUM GLUCONATE 1 GRAM/50 ML IN SODIUM CHLORIDE, ISO-OSM IV SOLUTION
1 gram/50 mL | INTRAVENOUS | Status: AC
Start: 2021-05-12 — End: 2021-05-12
  Administered 2021-05-12 (×2): via INTRAVENOUS

## 2021-05-12 MED ORDER — WHITE PETROLATUM-MINERAL OIL 56.8 %-42.5 % EYE OINTMENT
56.8-42.5 % | Freq: Every day | OPHTHALMIC | Status: DC
Start: 2021-05-12 — End: 2021-05-26
  Administered 2021-05-12 – 2021-05-23 (×10): via OPHTHALMIC

## 2021-05-12 MED ORDER — INSULIN LISPRO 100 UNIT/ML INJECTION
100 unit/mL | Freq: Four times a day (QID) | SUBCUTANEOUS | Status: AC
Start: 2021-05-12 — End: 2021-05-19
  Administered 2021-05-12 – 2021-05-19 (×19): via SUBCUTANEOUS

## 2021-05-12 MED ORDER — SODIUM BICARBONATE 8.4 % (1 MEQ/ML) IV SYRG
8.4 % (1 mEq/mL) | INTRAVENOUS | Status: AC
Start: 2021-05-12 — End: 2021-05-12
  Administered 2021-05-12: 14:00:00 via INTRAVENOUS

## 2021-05-12 MED ORDER — DEXTROSE 50% IN WATER (D50W) IV SYRG
INTRAVENOUS | Status: AC | PRN
Start: 2021-05-12 — End: 2021-05-26

## 2021-05-12 MED ORDER — IPRATROPIUM-ALBUTEROL 2.5 MG-0.5 MG/3 ML NEB SOLUTION
2.5 mg-0.5 mg/3 ml | RESPIRATORY_TRACT | Status: AC
Start: 2021-05-12 — End: 2021-05-11
  Administered 2021-05-12: 02:00:00 via RESPIRATORY_TRACT

## 2021-05-12 MED ORDER — PHARMACY VANCOMYCIN NOTE
Freq: Once | Status: AC
Start: 2021-05-12 — End: 2021-05-13
  Administered 2021-05-13: 14:00:00

## 2021-05-12 MED ORDER — ALBUTEROL SULFATE 2.5 MG/0.5 ML NEB SOLUTION
2.5 mg/0.5 mL | RESPIRATORY_TRACT | Status: AC
Start: 2021-05-12 — End: 2021-05-12
  Administered 2021-05-12: 14:00:00 via RESPIRATORY_TRACT

## 2021-05-12 MED ORDER — METHYLPREDNISOLONE (PF) 40 MG/ML IJ SOLR
40 mg/mL | Freq: Three times a day (TID) | INTRAMUSCULAR | Status: DC
Start: 2021-05-12 — End: 2021-05-17
  Administered 2021-05-12 – 2021-05-17 (×16): via INTRAVENOUS

## 2021-05-12 MED ORDER — IPRATROPIUM-ALBUTEROL 2.5 MG-0.5 MG/3 ML NEB SOLUTION
2.5 mg-0.5 mg/3 ml | RESPIRATORY_TRACT | Status: AC
Start: 2021-05-12 — End: 2021-05-12
  Administered 2021-05-12 (×6): via RESPIRATORY_TRACT

## 2021-05-12 MED FILL — IPRATROPIUM-ALBUTEROL 2.5 MG-0.5 MG/3 ML NEB SOLUTION: 2.5 mg-0.5 mg/3 ml | RESPIRATORY_TRACT | Qty: 3

## 2021-05-12 MED FILL — ELECTROLYTE REPLACEMENT PROTOCOL: Qty: 1

## 2021-05-12 MED FILL — PIPERACILLIN-TAZOBACTAM 3.375 GRAM IV SOLR: 3.375 gram | INTRAVENOUS | Qty: 3.38

## 2021-05-12 MED FILL — PANTOPRAZOLE 40 MG IV SOLR: 40 mg | INTRAVENOUS | Qty: 40

## 2021-05-12 MED FILL — HUMULIN R REGULAR U-100 INSULIN 100 UNIT/ML INJECTION SOLUTION: 100 unit/mL | INTRAMUSCULAR | Qty: 1

## 2021-05-12 MED FILL — PIPERACILLIN-TAZOBACTAM 4.5 GRAM IV SOLR: 4.5 gram | INTRAVENOUS | Qty: 4.5

## 2021-05-12 MED FILL — FENTANYL IN NS (PF) 10 MCG/ML IN 0.9% NACL INFUSION: 10 mcg/mL | INTRAVENOUS | Qty: 100

## 2021-05-12 MED FILL — PHARMACY VANCOMYCIN NOTE: Qty: 1

## 2021-05-12 MED FILL — VANCOMYCIN IN 0.9 % SODIUM CHLORIDE 1.25 GRAM/250 ML IV: 1.25 gram/250 mL | INTRAVENOUS | Qty: 250

## 2021-05-12 MED FILL — DEXMEDETOMIDINE 200 MCG/50 ML (4 MCG/ML) IN 0.9 % SODIUM CHLORIDE IV: 200 mcg/50 mL (4 mcg/mL) | INTRAVENOUS | Qty: 100

## 2021-05-12 MED FILL — DIPRIVAN 10 MG/ML INTRAVENOUS EMULSION: 10 mg/mL | INTRAVENOUS | Qty: 100

## 2021-05-12 MED FILL — VANCOMYCIN IN 0.9 % SODIUM CHLORIDE 2 GRAM/500 ML IV: 2 gram/500 mL | INTRAVENOUS | Qty: 500

## 2021-05-12 MED FILL — NOREPINEPHRINE BITARTRATE 8 MG/250 ML (32 MCG/ML) IN 0.9 % NACL IV: 8 mg/250 mL (32 mcg/mL) | INTRAVENOUS | Qty: 250

## 2021-05-12 MED FILL — BUDESONIDE 0.5 MG/2 ML NEB SUSPENSION: 0.5 mg/2 mL | RESPIRATORY_TRACT | Qty: 1

## 2021-05-12 MED FILL — ISOVUE-370  76 % INTRAVENOUS SOLUTION: 370 mg iodine /mL (76 %) | INTRAVENOUS | Qty: 85

## 2021-05-12 MED FILL — SOLU-MEDROL (PF) 125 MG/2 ML SOLUTION FOR INJECTION: 125 mg/2 mL | INTRAMUSCULAR | Qty: 2

## 2021-05-12 MED FILL — DEXTROSE 50% IN WATER (D50W) IV SYRG: INTRAVENOUS | Qty: 50

## 2021-05-12 MED FILL — SOLU-MEDROL (PF) 40 MG/ML SOLUTION FOR INJECTION: 40 mg/mL | INTRAMUSCULAR | Qty: 1

## 2021-05-12 MED FILL — ROCURONIUM 10 MG/ML IV: 10 mg/mL | INTRAVENOUS | Qty: 10

## 2021-05-12 MED FILL — ALBUTEROL SULFATE 2.5 MG/0.5 ML NEB SOLUTION: 2.5 mg/0.5 mL | RESPIRATORY_TRACT | Qty: 4

## 2021-05-12 MED FILL — REFRESH LACRI-LUBE 56.8 %-42.5 % EYE OINTMENT: OPHTHALMIC | Qty: 3.5

## 2021-05-12 MED FILL — ETOMIDATE 2 MG/ML IV SOLN: 2 mg/mL | INTRAVENOUS | Qty: 20

## 2021-05-12 MED FILL — MIDAZOLAM (PF) 1 MG/ML IN NS IV INFUSION: 1 mg/mL | INTRAVENOUS | Qty: 100

## 2021-05-12 MED FILL — SODIUM BICARBONATE 8.4 % (1 MEQ/ML) IV SYRG: 8.4 % (1 mEq/mL) | INTRAVENOUS | Qty: 50

## 2021-05-12 MED FILL — CALCIUM GLUCONATE 1 GRAM/50 ML IN SODIUM CHLORIDE, ISO-OSM IV SOLUTION: 1 gram/50 mL | INTRAVENOUS | Qty: 50

## 2021-05-12 MED FILL — ENOXAPARIN 40 MG/0.4 ML SUB-Q SYRINGE: 40 mg/0.4 mL | SUBCUTANEOUS | Qty: 1.2

## 2021-05-12 NOTE — ED Notes (Signed)
 TRANSFER - OUT REPORT:    Verbal report given to Genesis RN (name) on [name] [name]  being transferred to ICU 4(unit) for routine progression of care       Report consisted of patient's Situation, Background, Assessment and   Recommendations(SBAR).     Information from the following report(s) SBAR, ED Summary, and MAR was reviewed with the receiving nurse.    Lines:   Peripheral IV 05/11/21 Left;Upper Cephalic (Active)       Intraosseous Line 05/11/21 Left;Tibia (Active)        Opportunity for questions and clarification was provided.      Patient transported with:   Monitor  Registered Nurse

## 2021-05-12 NOTE — Progress Notes (Signed)
Problem: Ventilator Management  Goal: *Adequate oxygenation and ventilation  Outcome: Progressing Towards Goal  Goal: *Patient maintains clear airway/free of aspiration  Outcome: Progressing Towards Goal  Goal: *Absence of infection signs and symptoms  Outcome: Progressing Towards Goal  Goal: *Normal spontaneous ventilation  Outcome: Progressing Towards Goal

## 2021-05-12 NOTE — Progress Notes (Signed)
 [facility] Dosing Services: Initial Vancomycin  Note    This consult is provided for this 70 y.o. year old female for vancomycin  dosing.    Ordering physician: Dr. Oliva C. [name]    Vancomycin  indication: CAP    Day of therapy: 0    Ht Readings from Last 1 Encounters:   05/11/21 170.2 cm (67)        Wt Readings from Last 1 Encounters:   05/11/21 103 kg (227 lb)        Previous Regimen -   Last Level -   Other Current Antibiotics zosyn    Significant Cultures In process   Serum Creatinine Lab Results   Component Value Date/Time    Creatinine 0.8 05/11/2021 08:21 PM      Creatinine Clearance Estimated Creatinine Clearance: 83.1 mL/min (based on SCr of 0.8 mg/dL).   BUN Lab Results   Component Value Date/Time    BUN 20 05/11/2021 08:21 PM      WBC Lab Results   Component Value Date/Time    WBC 18.6 (H) 05/12/2021 01:55 AM      H/H Lab Results   Component Value Date/Time    HGB 11.2 (L) 05/12/2021 01:55 AM      Platelets Lab Results   Component Value Date/Time    PLATELET 200 05/12/2021 01:55 AM      Temp 98.9 F (37.2 C)     Trough goal: 15-20 mcg/mL      Plan: Start vancomycin  therapy with a loading dose of 2000 mg. Follow with a maintenance dose of 1250 mg every 12 hours. Maintenance dose scheduled to start 8 hours after LD due to patient's weight (>100kg). Trough scheduled for 12/1 @ 0900.     MRSA PCR scheduled for 11/30 @ 0400.     Predicted AUC is 550 mcg*hr/mL and trough is 17.9 mcg/mL.    Pharmacy to follow daily and will make changes to dose and/or frequency based on clinical status.    Thank you for this consult.     Jasmine M. Estelle, PharmD

## 2021-05-12 NOTE — Progress Notes (Signed)
Peripheral Vascular Lab Preliminary : Left Upper Extremity Venous Duplex    1. No evidence of deep vein thrombosis noted in the left upper extremity and left internal jugular vein.    Final report to follow   Jovanne Ramirez RVT, RVS

## 2021-05-12 NOTE — Progress Notes (Signed)
 Daily Ventilator Progress Note      Name:  [name] [name]  Gender:  F    DOB:  1950/08/28 Age:  70 y.o.    MRN:  [id number]  Admit Date:  05/11/2021      Primary Encounter Diagnosis:       The primary encounter diagnosis was Acute respiratory failure with hypoxia and hypercapnia (HCC). Diagnoses of Leukocytosis, unspecified type, Acute hyperkalemia, and Hypercarbia were also pertinent to this visit.      Active Problem List:     Patient Active Problem List    Diagnosis    Respiratory failure (HCC)    Acute CHF (congestive heart failure) (HCC)    UTI (urinary tract infection)    COPD exacerbation (HCC)    Acute hypoxemic respiratory failure (HCC)    Chronic respiratory failure with hypoxia (HCC)     On 3L O2 chronically.      Headache    Abdominal pain    SIRS (systemic inflammatory response syndrome) (HCC)    Hyperkalemia    Obtunded    Acute renal failure (ARF) (HCC)    Septic shock (HCC)    Metabolic encephalopathy    Dehydration    COPD with acute exacerbation (HCC)    Acute-on-chronic kidney injury (HCC)    Chest pain    Dyspnea    Type 2 diabetes mellitus with diabetic neuropathy (HCC)    Narcotic bowel syndrome (HCC)    Cannabinoid hyperemesis syndrome    Gastritis    Nausea & vomiting    Asthma with acute exacerbation    Lactic acidosis    Leukocytosis    Sepsis (HCC)    Asthma exacerbation    Type 2 diabetes mellitus with nephropathy (HCC)    Sacroiliitis (HCC)    Spondylosis of lumbar region without myelopathy or radiculopathy    Lumbar and sacral osteoarthritis    Chronic pain syndrome    Uncontrolled type 2 diabetes mellitus with hyperglycemia (HCC)    Acute colitis    Acute hyperglycemia    Accelerated hypertension    Severe headache    Osteoarthritis of hips, bilateral    PTSD (post-traumatic stress disorder)    Severe hypertension             LOS: 1 day     ICU LOS:  4h     Vital signs in last 12 hours:   Patient Vitals for the past 12 hrs:   Temp Pulse Resp BP SpO2   05/12/21 0445 -- 73 14 -- 100 %    05/12/21 0400 98.6 F (37 C) 73 14 105/78 100 %   05/12/21 0300 -- 77 14 119/82 100 %   05/12/21 0215 -- 80 22 118/86 100 %   05/12/21 0200 -- 80 15 -- 100 %   05/12/21 0147 -- -- -- -- 99 %   05/12/21 0145 -- 83 16 131/80 100 %   05/12/21 0130 -- 84 22 (!) 143/119 (!) 88 %   05/12/21 0120 -- -- -- -- 96 %   05/12/21 0110 -- -- -- -- 100 %   05/12/21 0055 -- -- -- -- 100 %   05/12/21 0045 -- -- -- -- 100 %   05/12/21 0033 -- 76 15 127/83 99 %   05/12/21 0030 -- -- -- -- 100 %   05/12/21 0023 -- 74 16 93/76 99 %   05/12/21 0021 -- 76 17 95/70 99 %   05/11/21 2315 -- 84 14 --  94 %   05/11/21 2207 -- -- -- -- 96 %   05/11/21 2202 -- -- -- -- (!) 88 %   05/11/21 2155 -- -- -- -- 99 %   05/11/21 2142 -- 89 19 (!) 143/91 99 %   05/11/21 2035 -- -- -- -- (!) 86 %   05/11/21 1934 -- 82 19 -- (!) 89 %   05/11/21 1931 -- 84 24 105/68 --   05/11/21 1818 98.9 F (37.2 C) 86 19 (!) 157/115 94 %          ABG Results (Last 7):     Recent Labs     05/12/21  0516 05/12/21  0104 05/11/21  2155 05/11/21  2021 05/11/21  1839 01/25/21  1524 01/24/21  1447   PH 7.407 7.282* 7.192* 7.201* 7.213* 7.420 7.405   PCO2 64.4* 95.3* 114.2* 114.3* 113.5* 58.9* 66.3*   PO2 66.0* 169.0* 70.0* 59.0* 100.0 82.0 86.0   HCO3 40.5* 45.2* 44.1* 44.9* 45.7* 38.2* 41.5*   BE 16* 18* 16* 17* 18* 14* 17*   O2ST 92.0 99.0 87.0* 81.0* 95.0 96.0 96.0   SITE L Radial L Radial L Radial L Radial R Radial R Radial R Brachial   SAMPLE Art Art Art Art Art Art Art   FIO2 60 100 50 40 40 35 35   HDEV Vent Vent BiPAP BiPAP BiPAP BiPAP BiPAP            ARDS is classified according to the degree of hypoxemia (PaO2/FiO2 ratio),  mild (PaO2/FiO2, 201-300), moderate (PaO2/FiO2, 101-200), and severe (PaO2/FiO2???100)       Airway:     Airway - Endotracheal Tube 05/11/21 Oral (Active)   Placement Date/Time: 05/11/21 2315   Number of Attempts: 1  Inserted By: Jeannette Cough, MD  Present on Admission/Arrival: Yes  Location: Oral  Placement Verified: Auscultation;BBS;Chest  x-ray;EtCO2  Airway Types: Endotracheal, cuffed  Airway Tube Si...   Number of days: 0       Airway - Endotracheal Tube 05/11/21 Oral (Active)   Insertion Depth (cm) 24 cm 05/12/21 0445   Line Mark Lips 05/12/21 0445   Side Secured Centered 05/12/21 0445   Cuff Pressure 26 cmH20 05/12/21 0445   Site Assessment Clean, dry, & intact 05/12/21 0445         Ventilator Settings:   Ventilator Mode: Assist control  FIO2 (%): 60 %, SpO2/FIO2 Ratio: 166.67, CMV Rate Set: 12, Back-Up Rate: 14, Vt Set (ml): 530 ml, PEEP/VENT (cm H2O): 5 cm H20, I:E Ratio: 1:4.0, Insp Time (sec): 1 sec, Insp Rise Time %: 20 %, Flow Trigger: 3.0, Exp Time (sec): 4 sec     Vt Set (ml): 530 ml Vt Exhaled (Machine Breath) (ml): 496 ml       Ventilator Pressures PIP Observed (cm H2O): 27 cm H2O, Plateau Pressure (cm H2O): 20 cm H2O, MAP (cm H2O): 8.9, PEEP/VENT (cm H2O): 5 cm H20         Safety & Alarms Circuit Temperature: 98.6 F (37 C), Backup Mode Checked/Apnea: Yes, Pressure Max: 40 cm H2O, Pressure Min: 8 cm H2O, Ve Min: 3, Ve Max: 18, Vt Min: 250 ml, Vt Max: 1250 ml, RR Min: 8, RR Max: 40       Breath Sounds:         Secretions:     Suction: ET Tube  Sputum Method Obtained: Endotracheal  Sputum Amount: Moderate  Sputum Color/Odor: White  Sputum Consistency: Thick     Chest  X-Ray:     CXR Results  (Last 48 hours)                 05/11/21 2355  XR CHEST SNGL V Final result    Impression:  IMPRESSION:   1. Opacity left perihilar regions secondary to atelectasis from   secretions/aspiration developed since radiograph of 1808 hours.   2. Large bullae left upper lobe.   3. Atelectasis left lung base.       Electronically signed by: Ripley Blanch, MD 05/12/2021 12:01 AM EST           Narrative:  Clinical history:   Endotracheal tube placement       EXAMINATION: Single view of the chest 05/11/2021       Correlation: CTA chest 05/11/2021 and chest radiograph 05/11/2021       FINDINGS:       Endotracheal tube and NG tube are in satisfactory position.  Trachea and heart   size are within normal limits. Subsegmental atelectasis left lung base. Right   lung is clear.       Large bullous changes left upper lobe. Left perihilar opacity new since earlier   chest radiograph corresponds to volume loss left upper lobe. Findings noted on   CTA chest. Volume loss may be secondary to secretions or aspiration.           05/11/21 1812  XR CHEST SNGL V Final result    Impression:  IMPRESSION: No acute cardiopulmonary process.       Electronically signed by: Kellie Guan, MD 05/11/2021 6:18 PM EST           Narrative:  ERASMO: XR CHEST SNGL V   INDICATION: SOB     COMPARISON: 01/24/2021   WORKSTATION ID: [id number]   TECHNIQUE: Frontal view.       FINDINGS:       SUPPORT DEVICES: None.       LUNGS/PLEURA: No consolidation or pleural effusion. Stable bullous changes in   the left upper lobe. Unchanged rounded calcified nodule in the left lung.       HEART/MEDIASTINUM: Cardiomediastinal silhouette is stable in appearance.       OTHER: None.                        XR Results (most recent):  Results from Hospital Encounter encounter on 05/11/21    XR CHEST SNGL V    Narrative  Clinical history:  Endotracheal tube placement    EXAMINATION: Single view of the chest 05/11/2021    Correlation: CTA chest 05/11/2021 and chest radiograph 05/11/2021    FINDINGS:    Endotracheal tube and NG tube are in satisfactory position. Trachea and heart  size are within normal limits. Subsegmental atelectasis left lung base. Right  lung is clear.    Large bullous changes left upper lobe. Left perihilar opacity new since earlier  chest radiograph corresponds to volume loss left upper lobe. Findings noted on  CTA chest. Volume loss may be secondary to secretions or aspiration.    Impression  IMPRESSION:  1. Opacity left perihilar regions secondary to atelectasis from  secretions/aspiration developed since radiograph of 1808 hours.  2. Large bullae left upper lobe.  3. Atelectasis left lung  base.    Electronically signed by: Ripley Blanch, MD 05/12/2021 12:01 AM EST        Weaning Trial:      RASS  Spade Agitation Sedation Scale (RASS): Light sedation  Summary:   ?  Patient's vT increase from 450 to 530 and RR decreased from 16 to 12. ABG shows improvement.     Plan:     Continue to monitor patient on (S)CMV on the venilator    Electronically Signed By: Jeral ONEIDA Grad, RT     May 12, 2021

## 2021-05-12 NOTE — Progress Notes (Signed)
Progress Notes by Everlene Farrier, MD at 05/12/21 1304                Author: Everlene Farrier, MD  Service: Hospitalist  Author Type: Physician       Filed: 05/12/21 1401  Date of Service: 05/12/21 1304  Status: Signed          Editor: Everlene Farrier, MD (Physician)                          INTERNAL MEDICINE PROGRESS NOTE      Date of note:      May 12, 2021      Patient:               [name] [name], 70 y.o., female   Admit Date:        05/11/2021   Length of Stay:  1 day(s)      Problem List:      Patient Active Problem List        Diagnosis  Code         ?  Severe hypertension  I10     ?  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  PTSD (post-traumatic stress disorder)  F43.10     ?  Severe headache  R51.9     ?  Accelerated hypertension  I10     ?  Acute colitis  K52.9     ?  Acute hyperglycemia  R73.9     ?  Uncontrolled type 2 diabetes mellitus with hyperglycemia (HCC)  E11.65     ?  Spondylosis of lumbar region without myelopathy or radiculopathy  M47.816     ?  Lumbar and sacral osteoarthritis  M47.817     ?  Chronic pain syndrome  G89.4     ?  Sacroiliitis ([place])  M46.1     ?  Type 2 diabetes mellitus with nephropathy (HCC)  E11.21     ?  Lactic acidosis  E87.20     ?  Leukocytosis  D72.829     ?  Sepsis ([place])  A41.9     ?  Asthma exacerbation  J45.901     ?  Asthma with acute exacerbation  J45.901     ?  Gastritis  K29.70     ?  Nausea & vomiting  R11.2     ?  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  Cannabinoid hyperemesis syndrome  R11.2, F12.90     ?  Type 2 diabetes mellitus with diabetic neuropathy (HCC)  E11.40     ?  Chest pain  R07.9     ?  Dyspnea  R06.00     ?  Dehydration  E86.0     ?  COPD with acute exacerbation ([place])  J44.1     ?  Acute-on-chronic kidney injury ([place])  N17.9, N18.9     ?  Hyperkalemia  E87.5     ?  Obtunded  R40.1     ?  Acute renal failure (ARF) (HCC)  N17.9     ?  Septic shock (HCC)  A41.9, R65.21     ?  Metabolic encephalopathy  E99.37      ?  SIRS (systemic inflammatory response syndrome) (HCC)  R65.10     ?  Abdominal pain  R10.9     ?  Headache  R51.9     ?  Chronic respiratory failure with hypoxia ([place])  J96.11     ?  COPD exacerbation ([place])  J44.1     ?  Acute hypoxemic respiratory failure (HCC)  J96.01     ?  UTI (urinary tract infection)  N39.0     ?  Acute CHF (congestive heart failure) (HCC)  I50.9         ?  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  Continue IV vancomycin plus Zosyn, de-escalate antibiotics tomorrow depending on culture results   Potassium 5.6, patient received hyperkalemia cocktail.  Monitor potassium levels        DVT ppx : Therapeutic Lovenox  GI prophylaxis-pantoprazole      Discussed with  : patient's husband and RN      - Code status: full code      Recommend to continue hospitalization.   Expected date of discharge: tbd   Plan for disposition - tbd        Objective:           Visit Vitals      BP  139/89     Pulse  86     Temp  98.2 ??F (36.8 ??C)     Resp  14     Ht  '5\' 7"'  (1.702 m)     Wt  103 kg (227 lb)     SpO2  100%        BMI  35.55 kg/m??                    Intake/Output Summary (Last 24 hours)  at 05/12/2021 1304   Last data filed at 05/12/2021 1217     Gross per 24 hour        Intake  1148.02 ml        Output  500 ml        Net  648.02 ml             Physical Exam:        GEN - intubated   HEENT - mucous membranes moist   Neck - supple, no JVD   Cardiac - RRR, S1, S2, no murmurs   Chest/Lungs - expiratory wheezing   Abdomen - soft, obese, non tender   Extremities - no clubbing/ cyanosis/ edema   Neuro - sedated , follows simple commands   Skin - no rashes or lesions        Current  medications:           Current Facility-Administered Medications:    ?  vancomycin (VANCOCIN) 1250 mg in NS 250 ml infusion, 1,250 mg, IntraVENous, Q12H, Lindajo Royal, MD, Last Rate: 125 mL/hr at 05/12/21  1049, 1,250 mg at 05/12/21 1049   ?  [START ON 05/13/2021] vancomycin trough due 12/1 @ 0900, , Other, ONCE, Lindajo Royal, MD   ?  enoxaparin (LOVENOX) injection 100 mg, 1 mg/kg, SubCUTAneous, Q12H, Knox Saliva, MD, 100 mg at 05/12/21 7062   ?  pantoprazole (PROTONIX) 40 mg in 0.9% sodium chloride 10 mL injection, 40 mg, IntraVENous, DAILY, Sherran Needs, NP, 40 mg at 05/12/21  0919   ?  dexmedeTOMidine in 0.9 % NaCl (PRECEDEX) 200 mcg/50 mL (4 mcg/mL) infusion soln, 0.2-0.7 mcg/kg/hr, IntraVENous, TITRATE, Sherran Needs, NP, Last Rate: 7.7 mL/hr at 05/12/21 1249, 0.3 mcg/kg/hr at 05/12/21 1249   ?  dextrose (D50W) injection syrg 5-25 g, 10-50 mL, IntraVENous, PRN, Sherran Needs, NP   ?  glucagon (GLUCAGEN) injection 1 mg, 1 mg, IntraMUSCular, PRN, Sherran Needs, NP   ?  insulin glargine (LANTUS) injection 1-100 Units, 1-100 Units, SubCUTAneous, QHS, Zielinski, Laura C, NP   ?  insulin lispro (HUMALOG) injection 1-100 Units, 1-100 Units, SubCUTAneous, Q6H, Sherran Needs, NP, 2 Units at 05/12/21 1217   ?  piperacillin-tazobactam (ZOSYN) 3.375 g in 0.9% sodium chloride (MBP/ADV) 100 mL MBP, 3.375 g, IntraVENous, Q8H, Lindajo Royal, MD, Last  Rate: 25 mL/hr at 05/12/21 0911, 3.375 g at 05/12/21  0911   ?  budesonide (PULMICORT) 500 mcg/2 ml nebulizer suspension, 500 mcg, Nebulization, BID RT, Aggie Cosier, PA-C, 500 mcg at 05/12/21 3762   ?  methylPREDNISolone (PF) (SOLU-MEDROL) injection 40 mg, 40 mg, IntraVENous, Q8H, Aggie Cosier, PA-C, 40 mg at 05/12/21 0550   ?  montelukast (SINGULAIR) tablet 10 mg, 10 mg, Oral, QHS, Reid, Hailey, PA-C   ?  dextrose (D50W) injection syrg 10-15 g, 20-30 mL, IntraVENous, PRN, Aggie Cosier, PA-C   ?  propofol (DIPRIVAN) 10 mg/mL infusion, 0-50 mcg/kg/min, IntraVENous, TITRATE, Lindajo Royal, MD, Held at 05/12/21 0004   ?  white petrolatum-mineral oiL (LACRILUBE S.O.P.) ointment 1 Each, 1 Each, Both Eyes, DAILY, Lindajo Royal, MD, 1 Each at 05/12/21 0920   ?  PHOSPHATE ELECTROLYTE REPLACEMENT PROTOCOL STANDARD DOSING, 1 Each, Other, PRN, Coulter, Memory Argue, NP   ?  MAGNESIUM ELECTROLYTE REPLACEMENT PROTOCOL STANDARD DOSING, 1 Each, Other, PRN, Coulter, Memory Argue, NP   ?  POTASSIUM ELECTROLYTE REPLACEMENT PROTOCOL STANDARD DOSING, 1 Each, Other, PRN, Coulter, Memory Argue, NP   ?  CALCIUM ELECTROLYTE REPLACEMENT PROTOCOL STANDARD DOSING, 1 Each, Other, PRN, Coulter, Memory Argue, NP   ?  NOREPINephrine (LEVOPHED) 8 mg in 0.9% NS 228m infusion, 2-30 mcg/min, IntraVENous, TITRATE, HLindajo Royal MD, Stopped at 05/12/21 0100   ?  *Vancomycin dosing by pharmacy, 1 Each, Other, Rx Dosing/Monitoring, HLindajo Royal MD   ?  fentaNYL (PF) 10 mcg/mL infusion, 0-200 mcg/hr, IntraVENous, TITRATE, OLowella Curb MD, Last Rate: 7.5 mL/hr at 05/12/21 1249, 75 mcg/hr  at 05/12/21 1249        Labs:          Recent Results (from the past 24 hour(s))     EKG, 12 LEAD, INITIAL          Collection Time: 05/11/21  5:48 PM         Result  Value  Ref Range            Ventricular Rate  91  BPM       Atrial Rate  91  BPM       P-R Interval  164  ms       QRS Duration  70  ms       Q-T Interval  356  ms       QTC Calculation (Bezet)  437  ms       Calculated P Axis  66  degrees       Calculated R  Axis  10  degrees       Calculated T Axis  35  degrees       Diagnosis                 Sinus rhythm with fusion complexes   Septal infarct , age undetermined   Abnormal ECG   When compared with ECG of 24-Jan-2021 08:12,   fusion complexes are now present   premature supraventricular complexes are no longer present   Septal infarct is now present   Confirmed by Massie Maroon, M.D., Synetta Shadow [phone]) on 05/12/2021 7:47:40 AM          CBC WITH AUTOMATED DIFF          Collection Time: 05/11/21  6:00 PM         Result  Value  Ref Range            WBC  19.0 (H)  4.0 - 11.0 1000/mm3       RBC  4.47  3.60 - 5.20 M/uL       HGB  12.0 (L)  13.0 - 17.2 gm/dl       HCT  41.2  37.0 - 50.0 %       MCV  92.2  80.0 - 98.0 fL       MCH  26.8  25.4 - 34.6 pg       MCHC  29.1 (L)  30.0 - 36.0 gm/dl       PLATELET  172  140 - 450 1000/mm3       MPV  11.7 (H)  6.0 - 10.0 fL       RDW-SD  49.1 (H)  36.4 - 46.3         NRBC  0  0 - 0         IMMATURE GRANULOCYTES  1.3  0.0 - 3.0 %       NEUTROPHILS  65.4 (H)  34 - 64 %       LYMPHOCYTES  23.7 (L)  28 - 48 %       MONOCYTES  7.6  1 - 13 %       EOSINOPHILS  1.6  0 - 5 %       BASOPHILS  0.4  0 - 3 %       METABOLIC PANEL, BASIC          Collection Time: 05/11/21  6:00 PM         Result  Value  Ref Range            Potassium  5.7 (H)  3.5 - 5.1 mEq/L       Chloride  93 (L)  98 - 107 mEq/L       Sodium  137  136 - 145 mEq/L       CO2  36 (H)  20 - 31 mEq/L  Glucose  165 (H)  74 - 106 mg/dl       BUN  10  9 - 23 mg/dl       Creatinine  0.65  0.55 - 1.02 mg/dl       GFR est AA  >60.0          GFR est non-AA  >60          Calcium  9.3  8.7 - 10.4 mg/dl       Anion gap  8  5 - 15 mmol/L       NT-PRO BNP          Collection Time: 05/11/21  6:00 PM         Result  Value  Ref Range            NT pro-BNP  22  0 - 125         MAGNESIUM          Collection Time: 05/11/21  6:00 PM         Result  Value  Ref Range            Magnesium  1.9  1.6 - 2.6 mg/dL       LACTIC ACID          Collection Time: 05/11/21   6:00 PM         Result  Value  Ref Range            Lactic Acid  2.7 (H)  0.5 - 2.2 mmol/L       TROPONIN-HIGH SENSITIVITY          Collection Time: 05/11/21  6:00 PM         Result  Value  Ref Range            Troponin-High Sensitivity  <3  0 - 34 ng/L       CULTURE, BLOOD          Collection Time: 05/11/21  6:00 PM       Specimen: Peripheral blood         Result  Value  Ref Range            Blood Culture Result  Culture In Progress, Daily Updates To Follow          PROCALCITONIN          Collection Time: 05/11/21  6:00 PM         Result  Value  Ref Range            PROCALCITONIN  <0.05  0.00 - 0.50 ng/ml       POC BLOOD GAS + LACTIC ACID          Collection Time: 05/11/21  6:39 PM         Result  Value  Ref Range            pH  7.213 (LL)  7.350 - 7.450         PCO2  113.5 (HH)  35.0 - 45.0 mm Hg       PO2  100.0  75 - 100 mm Hg       BICARBONATE  45.7 (HH)  18.0 - 26.0 mmol/L       O2 SAT  95.0  90 - 100 %       CO2, TOTAL  49.0 (H)  24 - 29 mmol/L       Lactic Acid  1.44  0.40 -  2.00 mmol/L       BASE EXCESS  18 (H)  -2 - 3 mmol/L       Sample type  Art          FIO2  40          SITE  R Radial          DEVICE  BiPAP          ALLENS TEST  Pass          IPAP/PIP  22          EPAP/CPAP/PEEP  12          Respiratory Rate  20          SET RATE  12          VTEXP  664          [id number]-[id number]          Collection Time: 05/11/21  8:00 PM       Specimen: NASOPHARYNGEAL SWAB         Result  Value  Ref Range            COVID-19  NEGATIVE  NEGATIVE         Influenza A PCR  NEGATIVE  NEGATIVE         Influenza B PCR  NEGATIVE  NEGATIVE         RSV by PCR  NEGATIVE  NEGATIVE         CULTURE, BLOOD          Collection Time: 05/11/21  8:00 PM       Specimen: Peripheral blood         Result  Value  Ref Range            Blood Culture Result  Culture In Progress, Daily Updates To Follow          POC CHEM8          Collection Time: 05/11/21  8:21 PM         Result  Value  Ref Range            Sodium  136  136 - 145 mEq/L        Potassium  5.5 (H)  3.5 - 4.9 mEq/L       Chloride  90 (L)  98 - 107 mEq/L       CO2, TOTAL  44 (HH)  21 - 32 mmol/L       Glucose  162 (H)  74 - 106 mg/dL       BUN  20  7 - 25 mg/dl       Creatinine  0.8  0.6 - 1.3 mg/dl       HCT  43  38 - 45 %       HGB  14.6  12.4 - 17.2 gm/dl       CALCIUM,IONIZED  4.40  4.40 - 5.40 mg/dL       POC BLOOD GAS + LACTIC ACID          Collection Time: 05/11/21  8:21 PM         Result  Value  Ref Range            pH  7.201 (LL)  7.350 - 7.450         PCO2  114.3 (HH)  35.0 - 45.0 mm Hg       PO2  59.0 (L)  75 -  100 mm Hg       BICARBONATE  44.9 (HH)  18.0 - 26.0 mmol/L       O2 SAT  81.0 (L)  90 - 100 %       CO2, TOTAL  48.0 (H)  24 - 29 mmol/L       Lactic Acid  0.90  0.40 - 2.00 mmol/L       BASE EXCESS  17 (H)  -2 - 3 mmol/L       Patient temp.  98.0 F          Sample type  Art          FIO2  40          SITE  L Radial          DEVICE  BiPAP          ALLENS TEST  Pass          IPAP/PIP  22          EPAP/CPAP/PEEP  12          Respiratory Rate  20          SET RATE  12          VTEXP  300          POC BLOOD GAS + LACTIC ACID          Collection Time: 05/11/21  9:55 PM         Result  Value  Ref Range            pH  7.192 (LL)  7.350 - 7.450         PCO2  114.2 (HH)  35.0 - 45.0 mm Hg       PO2  70.0 (L)  75 - 100 mm Hg       BICARBONATE  44.1 (HH)  18.0 - 26.0 mmol/L       O2 SAT  87.0 (L)  90 - 100 %       CO2, TOTAL  48.0 (H)  24 - 29 mmol/L       Lactic Acid  0.92  0.40 - 2.00 mmol/L       BASE EXCESS  16 (H)  -2 - 3 mmol/L       Patient temp.  98.0 F          Sample type  Art          FIO2  50          SITE  L Radial          DEVICE  BiPAP          ALLENS TEST  Pass          IPAP/PIP  24          EPAP/CPAP/PEEP  12          Respiratory Rate  20          SET RATE  16          VTEXP  337          POC BLOOD GAS + LACTIC ACID          Collection Time: 05/12/21  1:04 AM         Result  Value  Ref Range            pH  7.282 (LL)  7.350 - 7.450         PCO2  95.3 (HH)  35.0 - 45.0  mm Hg       PO2  169.0 (H)  75 - 100 mm Hg       BICARBONATE  45.2 (HH)  18.0 - 26.0 mmol/L       O2 SAT  99.0  90 - 100 %       CO2, TOTAL  48.0 (H)  24 - 29 mmol/L       Lactic Acid  1.54  0.40 - 2.00 mmol/L       BASE EXCESS  18 (H)  -2 - 3 mmol/L       Patient temp.  98.0 F          Sample type  Art          FIO2  100          SITE  L Radial          DEVICE  Vent          ALLENS TEST  Pass          Respiratory Rate  14          SET RATE  14          MODE  P-CMV          PEEP/CPAP  5          Pressure control  15          VTEXP  490          GLUCOSE, POC          Collection Time: 05/12/21  1:44 AM         Result  Value  Ref Range            Glucose (POC)  228 (H)  65 - 105 mg/dL       CBC WITH AUTOMATED DIFF          Collection Time: 05/12/21  1:55 AM         Result  Value  Ref Range            WBC  18.6 (H)  4.0 - 11.0 1000/mm3       RBC  4.17  3.60 - 5.20 M/uL       HGB  11.2 (L)  13.0 - 17.2 gm/dl       HCT  38.4  37.0 - 50.0 %       MCV  92.1  80.0 - 98.0 fL       MCH  26.9  25.4 - 34.6 pg       MCHC  29.2 (L)  30.0 - 36.0 gm/dl       PLATELET  200  140 - 450 1000/mm3       MPV  11.2 (H)  6.0 - 10.0 fL       RDW-SD  49.3 (H)  36.4 - 46.3         NRBC  0  0 - 0         IMMATURE GRANULOCYTES  0.6  0.0 - 3.0 %       NEUTROPHILS  94.9 (H)  34 - 64 %       LYMPHOCYTES  3.3 (L)  28 - 48 %       MONOCYTES  0.9 (L)  1 - 13 %       EOSINOPHILS  0.1  0 - 5 %       BASOPHILS  0.2  0 - 3 %       METABOLIC PANEL, COMPREHENSIVE          Collection Time: 05/12/21  5:16 AM         Result  Value  Ref Range            Potassium  5.6 (H)  3.5 - 5.1 mEq/L       Chloride  95 (L)  98 - 107 mEq/L       Sodium  137  136 - 145 mEq/L       CO2  36 (H)  20 - 31 mEq/L       Glucose  208 (H)  74 - 106 mg/dl       BUN  18  9 - 23 mg/dl       Creatinine  0.67  0.55 - 1.02 mg/dl       GFR est AA  >60.0          GFR est non-AA  >60          Calcium  9.0  8.7 - 10.4 mg/dl       Anion gap  6  5 - 15 mmol/L       AST (SGOT)  10.0  0.0 - 33.9  U/L       ALT (SGPT)  <7 (L)  10 - 49 U/L       Alk. phosphatase  75  46 - 116 U/L       Bilirubin, total  0.40  0.30 - 1.20 mg/dl       Protein, total  6.7  5.7 - 8.2 gm/dl       Albumin  3.3 (L)  3.4 - 5.0 gm/dl       MAGNESIUM          Collection Time: 05/12/21  5:16 AM         Result  Value  Ref Range            Magnesium  2.1  1.6 - 2.6 mg/dL       POC BLOOD GAS + LACTIC ACID          Collection Time: 05/12/21  5:16 AM         Result  Value  Ref Range            pH  7.407  7.350 - 7.450         PCO2  64.4 (HH)  35.0 - 45.0 mm Hg       PO2  66.0 (L)  75 - 100 mm Hg       BICARBONATE  40.5 (HH)  18.0 - 26.0 mmol/L       O2 SAT  92.0  90 - 100 %       CO2, TOTAL  42.0 (H)  24 - 29 mmol/L       Lactic Acid  1.94  0.40 - 2.00 mmol/L       BASE EXCESS  16 (H)  -2 - 3 mmol/L       Patient temp.  37.2 C          Sample type  Art          FIO2  60          SITE  L Radial          DEVICE  Vent          ALLENS TEST  Pass  Respiratory Rate  12          SET RATE  12          MODE  (S)CMV          PEEP/CPAP  5          SETVT  530          VTEXP  507          GLUCOSE, POC          Collection Time: 05/12/21  6:01 AM         Result  Value  Ref Range            Glucose (POC)  210 (H)  65 - 105 mg/dL       URINALYSIS W/ RFLX MICROSCOPIC          Collection Time: 05/12/21  9:58 AM         Result  Value  Ref Range            Color  YELLOW  YELLOW,STRAW         Appearance  CLEAR  CLEAR         Glucose  NEGATIVE  NEGATIVE,Negative mg/dl       Bilirubin  NEGATIVE  NEGATIVE,Negative         Ketone  NEGATIVE  NEGATIVE,Negative mg/dl       Specific gravity  1.010  1.005 - 1.030         Blood  NEGATIVE  NEGATIVE,Negative         pH (UA)  7.5  5.0 - 9.0         Protein  30 (A)  NEGATIVE,Negative mg/dl       Urobilinogen  0.2  0.0 - 1.0 mg/dl       Nitrites  NEGATIVE  NEGATIVE,Negative         Leukocyte Esterase  NEGATIVE  NEGATIVE,Negative         S.PNEUMO AG, UR/CSF          Collection Time: 05/12/21  9:58 AM         Result  Value   Ref Range            Strep pneumo Ag, urine  NEGATIVE  NEGATIVE         LEGIONELLA PNEUMOPHILA AG, URINE          Collection Time: 05/12/21  9:58 AM       Specimen: Urine         Result  Value  Ref Range            Legionella Ag, urine  NEGATIVE  NEGATIVE         DRUG SCREEN, URINE          Collection Time: 05/12/21  9:58 AM         Result  Value  Ref Range            Amphetamine  NEGATIVE  NEGATIVE         Barbiturates  NEGATIVE  NEGATIVE         Benzodiazepines  POSITIVE (A)  NEGATIVE         Cocaine  NEGATIVE  NEGATIVE         Marijuana  POSITIVE (A)  NEGATIVE         Methadone  NEGATIVE  NEGATIVE         Opiates  NEGATIVE  NEGATIVE  Phencyclidine  NEGATIVE  NEGATIVE         MRSA SCREEN - PCR (NASAL)          Collection Time: 05/12/21  9:58 AM         Result  Value  Ref Range            MRSA PCR SCREEN  NEGATIVE  NEGATIVE         POC URINE MICROSCOPIC          Collection Time: 05/12/21  9:58 AM         Result  Value  Ref Range            Epithelial cells, squamous  1-4  /LPF       WBC  1-4  /HPF       RBC  0-5  /HPF       Bacteria  1+  /HPF       GLUCOSE, POC          Collection Time: 05/12/21 11:04 AM         Result  Value  Ref Range            Glucose (POC)  245 (H)  65 - 105 mg/dL           XR Results:   Results from [facility] encounter on 05/11/21      XR CHEST SNGL V      Narrative   Clinical history:   Endotracheal tube placement      EXAMINATION: Single view of the chest 05/11/2021      Correlation: CTA chest 05/11/2021 and chest radiograph 05/11/2021      FINDINGS:      Endotracheal tube and NG tube are in satisfactory position. Trachea and heart   size are within normal limits. Subsegmental atelectasis left lung base. Right   lung is clear.      Large bullous changes left upper lobe. Left perihilar opacity new since earlier   chest radiograph corresponds to volume loss left upper lobe. Findings noted on   CTA chest. Volume loss may be secondary to secretions or aspiration.       Impression   IMPRESSION:   1. Opacity left perihilar regions secondary to atelectasis from   secretions/aspiration developed since radiograph of 1808 hours.   2. Large bullae left upper lobe.   3. Atelectasis left lung base.      Electronically signed by: Kathlene Cote, MD 05/12/2021 12:01 AM EST         CT Results:   Results from [facility] encounter on 05/11/21      CT CHEST W CON PE PROTOCOL      Narrative   All CT exams at this facility use one or more dose reduction techniques   including automatic exposure control, mA/kV adjustment per patient's size, or   iterative reconstruction technique.      Clinical History: rule out PE.      Examination:   CTA CHEST with contrast. 3 mm spiral scanning was performed from the lung apices   to the upper pole of the kidneys. 3D MIP coronal and sagittal reconstruction   imaging has been performed.      Correlation:   08/30/2018, 02/18/2018 and 07/03/2015      Findings:      Degenerative changes of the spine. Secretions dependent portions of the trachea.   Dense airspace opacity with air bronchograms left upper lobe. There are   bilateral bullae.  Dependent atelectasis lung bases.      2 cm left lower lobe nodule slightly increased in size from 08/30/2018, 02/18/2018   and 07/02/2025 ((prior measurement 1.7, 1.9 and 1.3 cm). No dissection thoracic   aorta. Ascending aorta measures 4.7 cm unchanged from prior studies. The   esophagus is not dilated.      No lymph node enlargement in the axilla, mediastinum or hila. No pulmonary   embolism. Visualized portions of the liver, pancreas, adrenal glands are   unremarkable. 2.3 cm splenic lesion unchanged from 02/18/2018. 1.8 cm midpole   right renal hypodensity, ultrasound characterization suggested.      Impression   IMPRESSION:   1. No pulmonary embolism.   2. Bilateral bullae   3. Left upper lobe pneumonia/volume loss. Radiographic follow-up to resolution   suggested.   4. 2 cm left lower lobe nodule not significantly changed from  prior studies.   5. 2.3 cm stable splenic lesion, unchanged from 02/18/2018.   6. 1.8 cm probable cyst midpole right kidney, ultrasound characterization   suggested.      Electronically signed by: Kathlene Cote, MD 05/11/2021 10:11 PM EST                        VAS/US Results:   Results from [facility] encounter on 01/24/21      DUPLEX UPPER EXT VENOUS LEFT      Narrative   [id number]   [id number]   [id number]   Study ID: [id number]      [facility]   [address]. [place],   [place]      Upper Extremity Venous Duplex Report      Name: LORISA, SCHEID         Study Date: 01/24/2021 09:27 AM   MRN: [id number]                   Patient Location: [id number]   DOB: 1951/03/04               Age: 27 yrs   Gender: Female                Account #: [id number]   Reason For Study: Left arm pain and swelling   Ordering Physician: Jacinto Reap   Referring Physician: Magda Kiel E   Performed By: Bing Ree, RVT      Interpretation Summary   1. Acute deep vein thrombosis left upper extremity with occlusive thrombus   in the subclavian vein.   2. No further evidence of deep vein thrombosis in the left internal   jugular, axillary, brachial, radial and ulnar veins.   3. No evidence of deep vein thrombosis in the contralateral/right internal   jugular and subclavian veins.   ___________________________________________________________________________   QUALITY/PROCEDURE   Venous duplex ultrasound of the left upper extremity. ICD-10. M79.602.   M79.89.      HISTORY/SYMPTOMS   Diabetes. HTN. COPD. Hepatitis. GERD. Chronic Pain.      RIGHT ARM   The right internal jugular and subclavian were patent and free of thrombus   with normal venous hemodynamics.      LEFT ARM   The left upper extremity was examined with duplex ultrasound. B-mode   imaging demonstated normal compressibility of the internal jugular,   axillary, brachial, radial, and ulnar veins with no evidence of    intraluminal thrombus.  There was hypoechoic occlusive thrombus throughout   the subclavian vein with absent Doppler venous flow. Diminished   spontaneous Doppler venous flow was present in the veins proximal to the   subclavian vein thrombosis.      LEFT SUPERFICIAL VEINS   The cephalic and basilic veins were patent, compressible and free of   thrombus with spontaneous venous flow.      SONOGRAPHERS COMMENTS   Preliminary findings called to Dr. Magda Kiel.      Electronically signed byDr. Judithe Modest, M.D   01/26/2021 10:08 AM            Total clinical care time was 36  minutes of which more than 50% was spent in coordination of care and counseling (time spent with patient/family face to face, physical exam, reviewing laboratory and imaging investigations, speaking with physicians and  nursing staff involved in this patient's care).       Dragon medical dictation system was used for part of this note. Unintentional voice recognition errors may occur      Perfecto Kingdom,  MD   [facility]   May 12, 2021   Time: 1:04 PM

## 2021-05-12 NOTE — Progress Notes (Signed)
Critical Care Attending Attestation: 41'  I have reviewed  the patient's  chart. I saw and examined the patient, personally performed the critical or key portions of the service and discussed the management with the treatment team. I reviewed the NP's   note and concur with the history, findings and plan of care, which include's my edits and my assessment and plan. Laboratory, hemodynamic and radiological data were reviewed.   Seen and examined.  Multiple chest x-rays and CT scans reviewed.  Interesting case with history of significant bullous emphysema despite no history of smoking according to the husband by the bedside.  Presented with another exacerbation of her breathing.  CT scan showed atelectasis in the left lung which has been seen in the past.  Intubated due to failure of BiPAP.  Significant hypercapnia.  Wheezing on exam on the ventilator.  Following simple commands during sedation holiday.  Hemodynamically stable.  Plan: As detailed below.  Adjusted  notes and assessment and plan below.  Continue steroids, antibiotics.  May require bronchoscopy to evaluate the left midlung atelectasis.  Awaiting cultures.  We will add mucolytic's.  Check repeat ABG due to persistent hypercapnia on the last ABG.  Sedation holiday in a.m. and weaning trials.  High risk for failure of extubation due to significant tracheobronchomalacia and history of mucous plugging.  GI and DVT prophylaxis.  Otherwise ventilator bundle and ICU protocols. Further evaluation and management to follow depending on clinical progression and / or more available data.   Further management of other conditions per primary team and others involved.  35 ' cc time spent managing this patient, reviewing records and radiography and labs, discussing care with RN, RT and other physicians involved and documenting.  This is exclusive of any procedural time.    Marland Mcalpine, MD      [facility]    ICU Daily Progress Note :  34'    Patient: [name] [name]               Sex: female          DOA: 05/11/2021  Date of Birth:  1950-11-11       Age:  70 y.o.        LOS:  LOS: 1 day        Code Status: Full Code        [x] I have reviewed the flowsheet and previous day???s notes. Events, vitals, medications and notes from last 24 hours reviewed.     ASSESSMENT:   - Acute hypoxic hypercapnic respiratory failure requiring intubation likely secondary to pneumonia - h/o intubation in the past -  Currently intubated 05/11/21.    - CAP with L mucus plugging / atelectasis.  H/o same in 2020 on CT.    - Acute COPD exacerbation - with chronic COPD despite no smoking hx - followed previously by Dr. 2021, now Dr. Helene Shoe.    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   - Hx Lung nodule - 05/11/21 CTA Chest with LLL nodule (2 cm) - Chronic stable s/p CT guided bx in 2017  - Hx HTN - Clonidine 0.2 mg BID, Losartan 100 mg daily, Diltiazem CD 240 mg daily  -  Hx HLD - Crestor 5 mg daily  - Hx DMII  - Hx Thoracic ascending aortic aneurysm  - Hx Hepatitis C  - Hx LUE/Subclavian DVT - Eliquis   - Hx PTSD  - Hx Chronic pain   PLAN:   - Currently on mechanical ventilator. AC mode. TV 530 PEEP 5 FiO2 60%.  Wean FiO2 to keep SpO2 > 88%.  - Continue sedation with Fentanyl and Versed gtt. Goal RASS -1 to -2.  - Continue Duonebs q6, Pulmicort BID, Singulair 10 mg daily and Solumedrol 40 mg IV q8.   - Goal MAP > 65 and SBP 100-140. Norepinephrine gtt PRN.   - Continue Zosyn 3.375 g IV q8 and Vancomycin IV (pharmacy dosing). De-escalate antibiotics based on clinical progression and final C&S results. 05/11/21 BCx pending  - Avoid nephrotoxins. Renally dose medications. Strict I/Os. Daily weights.   - Electrolyte replacement per protocol.  Goal K > 4, Phos > 3 and Mg > 2  - Glucommander per protocol. Goal BS 140-180  - SUP: Protonix 40 mg IV daily  - DVT prophylaxis: SCDs and SQ Lovenox 100 mg daily  - Follow up LUE PVL to evaluate for DVT d/t hx LUE DVT and on Eliquis  - CBC, BMP, Magnesium and Phosphorus in am.   - Will defer respective systems problem management to primary and other consultant and follow patient in ICU with primary and other medical team    My assessment, plan of care, findings, medications, side effects etc were discussed with:  [x] nursing [] PT/OT    [x] respiratory therapy [x] Dr.   [] family []      [x] Total critical care time exclusive of procedures 40 minutes with complex decision making performed and > 50% time spent in face to face consultation.    Interval HISTORY     Interval History/Events:  Hyperkalemia (K 5.6) - given D50 + Insulin, Sodium bicarbonate 50 meq, Albuterol x 1 and Calcium gluconate.     ROS     Review of Systems   Unable to perform ROS: Acuity of condition     Physical Exam     Sedated on vent  GENERAL: Sedated, on vent  NECK: Supple. Trachea midline.   RESPIRATORY: On Vent. Bilateral BS present, decreased at bases. No rales or rhonchi.   CARDIOVASCULAR: S1 and S2 present, regular. No murmur, rub, or thrill.   ABDOMEN: Soft and nontender with positive bowel sounds. No organomegaly.   NEUROLOGICAL: Sedated, does not follow commands.     Ventilator Settings:  Mode Rate Tidal Volume Pressure FiO2 PEEP   Assist control   530 ml    60 % 5 cm H20     Imaging:  XR Results (most recent):  Results from Hospital Encounter encounter on 05/11/21    XR CHEST SNGL V    Narrative  Clinical history:  Endotracheal tube placement    EXAMINATION: Single view of the chest 05/11/2021    Correlation: CTA chest 05/11/2021 and chest radiograph 05/11/2021    FINDINGS:    Endotracheal tube and NG tube are in satisfactory position. Trachea and heart  size are within normal limits. Subsegmental atelectasis left lung base. Right  lung  is clear.    Large bullous changes left upper lobe. Left perihilar opacity new since earlier  chest radiograph corresponds to volume loss left upper lobe. Findings noted on  CTA chest. Volume loss may be secondary to secretions or aspiration.    Impression  IMPRESSION:  1. Opacity left perihilar regions  secondary to atelectasis from  secretions/aspiration developed since radiograph of 1808 hours.  2. Large bullae left upper lobe.  3. Atelectasis left lung base.    Electronically signed by: Jolayne Panther, MD 05/12/2021 12:01 AM EST     CT Results (most recent):  Results from Hospital Encounter encounter on 05/11/21    CT CHEST W CON PE PROTOCOL    Narrative  All CT exams at this facility use one or more dose reduction techniques  including automatic exposure control, mA/kV adjustment per patient's size, or  iterative reconstruction technique.    Clinical History: rule out PE.    Examination:  CTA CHEST with contrast. 3 mm spiral scanning was performed from the lung apices  to the upper pole of the kidneys. 3D MIP coronal and sagittal reconstruction  imaging has been performed.    Correlation:  08/30/2018, 02/18/2018 and 07/03/2015    Findings:    Degenerative changes of the spine. Secretions dependent portions of the trachea.  Dense airspace opacity with air bronchograms left upper lobe. There are  bilateral bullae. Dependent atelectasis lung bases.    2 cm left lower lobe nodule slightly increased in size from 08/30/2018, 02/18/2018  and 07/02/2025 ((prior measurement 1.7, 1.9 and 1.3 cm). No dissection thoracic  aorta. Ascending aorta measures 4.7 cm unchanged from prior studies. The  esophagus is not dilated.    No lymph node enlargement in the axilla, mediastinum or hila. No pulmonary  embolism. Visualized portions of the liver, pancreas, adrenal glands are  unremarkable. 2.3 cm splenic lesion unchanged from 02/18/2018. 1.8 cm midpole  right renal hypodensity, ultrasound characterization  suggested.    Impression  IMPRESSION:  1. No pulmonary embolism.  2. Bilateral bullae  3. Left upper lobe pneumonia/volume loss. Radiographic follow-up to resolution  suggested.  4. 2 cm left lower lobe nodule not significantly changed from prior studies.  5. 2.3 cm stable splenic lesion, unchanged from 02/18/2018.  6. 1.8 cm probable cyst midpole right kidney, ultrasound characterization  suggested.    Electronically signed by: Jolayne Panther, MD 05/11/2021 10:11 PM EST     Personally reviewed.     Gar Gibbon, AGACNP-BC  Critical Care Nurse Practitioner  May 12, 2021  7:38 AM

## 2021-05-13 LAB — CBC
Hematocrit: 35.2 % — ABNORMAL LOW (ref 37.0–50.0)
Hemoglobin: 11.1 gm/dl — ABNORMAL LOW (ref 13.0–17.2)
MCH: 27.2 pg (ref 25.4–34.6)
MCHC: 31.5 gm/dl (ref 30.0–36.0)
MCV: 86.3 fL (ref 80.0–98.0)
MPV: 11.7 fL — ABNORMAL HIGH (ref 6.0–10.0)
Platelets: 191 10*3/uL (ref 140–450)
RBC: 4.08 M/uL (ref 3.60–5.20)
RDW-SD: 47.3 — ABNORMAL HIGH (ref 36.4–46.3)
WBC: 18 10*3/uL — ABNORMAL HIGH (ref 4.0–11.0)

## 2021-05-13 LAB — POC BLOOD GAS + LACTIC ACID
BASE EXCESS: 15 mmol/L — ABNORMAL HIGH (ref <=3)
BICARBONATE: 38.5 mmol/L — CR (ref 18.0–26.0)
Base Excess: 15 mmol/L — ABNORMAL HIGH (ref <=3)
CO2 Total: 40 mmol/L — ABNORMAL HIGH (ref 24–29)
CO2, TOTAL: 40 mmol/L — ABNORMAL HIGH (ref 24–29)
FIO2: 60
FIO2: 60
HCO3: 38.5 mmol/L (ref 18.0–26.0)
LACTIC ACID: 0.62 mmol/L (ref 0.40–2.00)
Lactic Acid: 0.62 mmol/L (ref 0.40–2.00)
O2 SAT: 97 % (ref 90–100)
O2 Sat: 97 % (ref 90–100)
PCO2: 51.6 mm Hg — ABNORMAL HIGH (ref 35.0–45.0)
PCO2: 51.6 mm Hg — ABNORMAL HIGH (ref 35.0–45.0)
PEEP/CPAP: 5
PO2: 89 mm Hg (ref 75–100)
PO2: 89 mm Hg (ref 75–100)
Patient temp.: 98.8
Patient temperature: 98.8
Peep/Cpap: 5
SET RATE: 12
SETVT, SET VT: 500
SETVT: 500
Set Rate: 12
pH: 7.481 — ABNORMAL HIGH (ref 7.350–7.450)
pH: 7.481 — ABNORMAL HIGH (ref 7.350–7.450)

## 2021-05-13 LAB — BASIC METABOLIC PANEL
Anion Gap: 7 mmol/L (ref 5–15)
BUN: 20 mg/dl (ref 9–23)
CO2: 34 mEq/L — ABNORMAL HIGH (ref 20–31)
Calcium: 9.5 mg/dl (ref 8.7–10.4)
Chloride: 99 mEq/L (ref 98–107)
Creatinine: 0.63 mg/dl (ref 0.55–1.02)
EGFR IF NonAfrican American: >60
GFR African American: >60
Glucose: 181 mg/dl — ABNORMAL HIGH (ref 74–106)
Potassium: 4.2 mEq/L (ref 3.5–5.1)
Sodium: 140 mEq/L (ref 136–145)

## 2021-05-13 LAB — POCT GLUCOSE
POC Glucose: 175 mg/dL — ABNORMAL HIGH (ref 65–105)
POC Glucose: 173 mg/dL — ABNORMAL HIGH (ref 65–105)
POC Glucose: 198 mg/dL — ABNORMAL HIGH (ref 65–105)
POC Glucose: 179 mg/dL — ABNORMAL HIGH (ref 65–105)

## 2021-05-13 LAB — PHOSPHORUS
Phosphorus: 2.8 mg/dL (ref 2.4–5.1)
Phosphorus: 2.8 mg/dL (ref 2.4–5.1)

## 2021-05-13 LAB — VANCOMYCIN TROUGH: Vancomycin Tr: 15.8 ug/mL — ABNORMAL HIGH (ref 5.0–10.0)

## 2021-05-13 LAB — MAGNESIUM
Magnesium: 2.1 mg/dL (ref 1.6–2.6)
Magnesium: 2.1 mg/dL (ref 1.6–2.6)

## 2021-05-13 LAB — METABOLIC PANEL, BASIC
Anion gap: 7 mmol/L (ref 5–15)
BUN: 20 mg/dl (ref 9–23)
CO2: 34 mEq/L — ABNORMAL HIGH (ref 20–31)
Calcium: 9.5 mg/dl (ref 8.7–10.4)
Chloride: 99 mEq/L (ref 98–107)
Creatinine: 0.63 mg/dl (ref 0.55–1.02)
GFR est AA: >60
GFR est non-AA: >60
Glucose: 181 mg/dl — ABNORMAL HIGH (ref 74–106)
Potassium: 4.2 mEq/L (ref 3.5–5.1)
Sodium: 140 mEq/L (ref 136–145)

## 2021-05-13 LAB — CBC W/O DIFF
HCT: 35.2 % — ABNORMAL LOW (ref 37.0–50.0)
HGB: 11.1 gm/dl — ABNORMAL LOW (ref 13.0–17.2)
MCH: 27.2 pg (ref 25.4–34.6)
MCHC: 31.5 gm/dl (ref 30.0–36.0)
MCV: 86.3 fL (ref 80.0–98.0)
MPV: 11.7 fL — ABNORMAL HIGH (ref 6.0–10.0)
PLATELET: 191 10*3/uL (ref 140–450)
RBC: 4.08 M/uL (ref 3.60–5.20)
RDW-SD: 47.3 — ABNORMAL HIGH (ref 36.4–46.3)
WBC: 18 10*3/uL — ABNORMAL HIGH (ref 4.0–11.0)

## 2021-05-13 LAB — VANCOMYCIN, TROUGH: Vancomycin,trough: 15.8 ug/mL — ABNORMAL HIGH (ref 5.0–10.0)

## 2021-05-13 LAB — GLUCOSE, POC
Glucose (POC): 175 mg/dL — ABNORMAL HIGH (ref 65–105)
Glucose (POC): 173 mg/dL — ABNORMAL HIGH (ref 65–105)
Glucose (POC): 198 mg/dL — ABNORMAL HIGH (ref 65–105)
Glucose (POC): 179 mg/dL — ABNORMAL HIGH (ref 65–105)

## 2021-05-13 MED ORDER — PROPOFOL INFUSION
10 mg/mL | INTRAVENOUS | Status: AC
Start: 2021-05-13 — End: 2021-05-18
  Administered 2021-05-13 – 2021-05-17 (×39): via INTRAVENOUS

## 2021-05-13 MED ORDER — HYDRALAZINE 20 MG/ML IJ SOLN
20 mg/mL | Freq: Four times a day (QID) | INTRAMUSCULAR | Status: AC | PRN
Start: 2021-05-13 — End: 2021-05-26
  Administered 2021-05-13 – 2021-05-19 (×6): via INTRAVENOUS

## 2021-05-13 MED ORDER — METOPROLOL TARTRATE 5 MG/5 ML IV SOLN
5 mg/ mL | Freq: Once | INTRAVENOUS | Status: AC
Start: 2021-05-13 — End: 2021-05-13
  Administered 2021-05-13: 15:00:00 via INTRAVENOUS

## 2021-05-13 MED ORDER — DEXMEDETOMIDINE 200 MCG/50 ML (4 MCG/ML) IN 0.9 % SODIUM CHLORIDE IV
200504 mcg/50 mL (4 mcg/mL) | INTRAVENOUS | Status: DC
Start: 2021-05-13 — End: 2021-05-18
  Administered 2021-05-13 – 2021-05-18 (×49): via INTRAVENOUS

## 2021-05-13 MED ORDER — PROPOFOL 10 MG/ML IV EMUL
10 mg/mL | INTRAVENOUS | Status: DC
Start: 2021-05-13 — End: 2021-05-13
  Administered 2021-05-13: 14:00:00 via INTRAVENOUS

## 2021-05-13 MED FILL — PIPERACILLIN-TAZOBACTAM 3.375 GRAM IV SOLR: 3.375 gram | INTRAVENOUS | Qty: 3.38

## 2021-05-13 MED FILL — METOPROLOL TARTRATE 5 MG/5 ML IV SOLN: 5 mg/ mL | INTRAVENOUS | Qty: 5

## 2021-05-13 MED FILL — ADMELOG U-100 INSULIN LISPRO 100 UNIT/ML SUBCUTANEOUS SOLUTION: 100 unit/mL | SUBCUTANEOUS | Qty: 1

## 2021-05-13 MED FILL — REFRESH LACRI-LUBE 56.8 %-42.5 % EYE OINTMENT: OPHTHALMIC | Qty: 3.5

## 2021-05-13 MED FILL — SODIUM CHLORIDE 3 % NEB SOLUTION: 3 % | RESPIRATORY_TRACT | Qty: 15

## 2021-05-13 MED FILL — VANCOMYCIN IN 0.9 % SODIUM CHLORIDE 1.25 GRAM/250 ML IV: 1.25 gram/250 mL | INTRAVENOUS | Qty: 250

## 2021-05-13 MED FILL — ENOXAPARIN 40 MG/0.4 ML SUB-Q SYRINGE: 40 mg/0.4 mL | SUBCUTANEOUS | Qty: 1.2

## 2021-05-13 MED FILL — DIPRIVAN 10 MG/ML INTRAVENOUS EMULSION: 10 mg/mL | INTRAVENOUS | Qty: 100

## 2021-05-13 MED FILL — DEXMEDETOMIDINE 200 MCG/50 ML (4 MCG/ML) IN 0.9 % SODIUM CHLORIDE IV: 200 mcg/50 mL (4 mcg/mL) | INTRAVENOUS | Qty: 50

## 2021-05-13 MED FILL — BUDESONIDE 0.5 MG/2 ML NEB SUSPENSION: 0.5 mg/2 mL | RESPIRATORY_TRACT | Qty: 1

## 2021-05-13 MED FILL — SOLU-MEDROL (PF) 40 MG/ML SOLUTION FOR INJECTION: 40 mg/mL | INTRAMUSCULAR | Qty: 1

## 2021-05-13 MED FILL — ENOXAPARIN 100 MG/ML SUB-Q SYRINGE: 100 mg/mL | SUBCUTANEOUS | Qty: 1

## 2021-05-13 MED FILL — LANTUS U-100 INSULIN 100 UNIT/ML SUBCUTANEOUS SOLUTION: 100 unit/mL | SUBCUTANEOUS | Qty: 1

## 2021-05-13 MED FILL — HYDRALAZINE 20 MG/ML IJ SOLN: 20 mg/mL | INTRAMUSCULAR | Qty: 1

## 2021-05-13 MED FILL — MONTELUKAST 10 MG TAB: 10 mg | ORAL | Qty: 1

## 2021-05-13 MED FILL — PANTOPRAZOLE 40 MG IV SOLR: 40 mg | INTRAVENOUS | Qty: 40

## 2021-05-13 MED FILL — DOCUSATE SODIUM 50 MG/5 ML ORAL LIQUID: 50 mg/5 mL | ORAL | Qty: 10

## 2021-05-13 NOTE — Progress Notes (Signed)
05/13/21 0751   Weaning Parameters   Spontaneous Breathing Trial Complete Yes  (SBT PS=10 PEEP=5 O2=30%)   Resp Rate Observed 19   Ve 5.8   VT 281   RSBI 76   NIF -38   PEF 20   VC 473   [place] Agitation Sedation Scale (RASS) 0

## 2021-05-13 NOTE — Progress Notes (Signed)
[facility]    ICU Daily Progress Note 101'    Patient: [name] [name]               Sex: female          DOA: 05/11/2021  Date of Birth:  13-May-1951       Age:  70 y.o.        LOS:  LOS: 2 days        Code Status: Full Code        [x] I have reviewed the flowsheet and previous day???s notes. Events, vitals, medications and notes from last 24 hours reviewed.     ASSESSMENT:   - Acute hypoxic hypercapnic respiratory failure requiring intubation likely secondary to pneumonia - h/o intubation in the past -  Currently intubated 05/11/21.    - CAP with L mucus plugging / atelectasis.  H/o same in 2020 on CT.    - Acute COPD exacerbation - with chronic COPD despite no smoking hx - followed previously by Dr. 2021, now Dr. Helene Shoe.    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   - Hx Lung nodule - 05/11/21 CTA Chest with LLL nodule (2 cm) - Chronic stable s/p CT guided bx in 2017  - Hx HTN - Clonidine 0.2 mg BID, Losartan 100 mg daily, Diltiazem CD 240 mg daily  - Hx HLD - Crestor 5 mg daily  - Hx DMII  - Hx Thoracic ascending aortic aneurysm  - Hx Hepatitis C  - Hx LUE/Subclavian DVT - Eliquis   - Hx PTSD  - Hx Chronic pain   PLAN:   - Currently on mechanical ventilator. AC mode. TV 530 PEEP 5 FiO2 60%. Wean FiO2 to keep SpO2 > 88%.   - Turn off Propofol gtt. SBT and wean to extubate as tolerated.   - Continue Duonebs q6, Pulmicort BID, Hypertonic Saline nebs BID x 6 doses, Singulair 10 mg daily and Solumedrol 40 mg IV q8.   - Goal MAP > 65 and SBP 100-140.  - Continue Zosyn 3.375 g IV q8 and discontinue Vancomycin IV. De-escalate antibiotics based on clinical progression and final C&S results. 05/11/21 BCx NGTD, MRSA (-); 05/12/21  SCx NGTD  - Avoid nephrotoxins. Renally dose medications. Strict I/Os. Daily weights.   - Electrolyte replacement per protocol. Goal K > 4, Phos > 3 and Mg > 2  - Glucommander per protocol. Goal BS 140-180  - SUP: Protonix 40 mg IV daily  - DVT prophylaxis: SCDs and SQ Lovenox 100 mg daily  - CBC, BMP, Magnesium and Phosphorus in am.   - Will defer respective systems problem management to primary and other consultant and follow patient in ICU with primary and other medical team    My assessment, plan of care, findings, medications, side effects etc were discussed with:  [x] nursing [] PT/OT    [x] respiratory therapy [x] Dr. 05/14/21   [x] family []      [x] Total critical care time exclusive of procedures 35 minutes with complex decision making performed and > 50% time spent in face to face consultation.    Interval HISTORY     Interval History/Events:  No acute overnight events. SBT this  AM.    ROS     Review of Systems   Unable to perform ROS: Acuity of condition     Physical Exam     Sedated on vent  GENERAL: Awake, alert on vent  NECK: Supple. Trachea midline.   RESPIRATORY: On Vent. Bilateral BS present, decreased at bases. No rales or rhonchi.   CARDIOVASCULAR: S1 and S2 present, regular. No murmur, rub, or thrill.   ABDOMEN: Soft and nontender with positive bowel sounds. No organomegaly.   NEUROLOGICAL: Awake, follows commands    Ventilator Settings:  Mode Rate Tidal Volume Pressure FiO2 PEEP   Assist control   500 ml  12 cm H2O 60 % 5 cm H20     Imaging:    XR Results (most recent):  Results from Hospital Encounter encounter on 05/11/21    XR CHEST SNGL V    Narrative  Clinical history:  Endotracheal tube placement    EXAMINATION: Single view of the chest 05/11/2021    Correlation: CTA chest 05/11/2021 and chest radiograph 05/11/2021    FINDINGS:    Endotracheal tube and NG tube are in satisfactory position. Trachea and heart  size are within normal limits. Subsegmental atelectasis left lung base. Right  lung is  clear.    Large bullous changes left upper lobe. Left perihilar opacity new since earlier  chest radiograph corresponds to volume loss left upper lobe. Findings noted on  CTA chest. Volume loss may be secondary to secretions or aspiration.    Impression  IMPRESSION:  1. Opacity left perihilar regions secondary to atelectasis from  secretions/aspiration developed since radiograph of 1808 hours.  2. Large bullae left upper lobe.  3. Atelectasis left lung base.    Electronically signed by: Jolayne Panther, MD 05/12/2021 12:01 AM EST     CT Results (most recent):  Results from Hospital Encounter encounter on 05/11/21    CT CHEST W CON PE PROTOCOL    Narrative  All CT exams at this facility use one or more dose reduction techniques  including automatic exposure control, mA/kV adjustment per patient's size, or  iterative reconstruction technique.    Clinical History: rule out PE.    Examination:  CTA CHEST with contrast. 3 mm spiral scanning was performed from the lung apices  to the upper pole of the kidneys. 3D MIP coronal and sagittal reconstruction  imaging has been performed.    Correlation:  08/30/2018, 02/18/2018 and 07/03/2015    Findings:    Degenerative changes of the spine. Secretions dependent portions of the trachea.  Dense airspace opacity with air bronchograms left upper lobe. There are  bilateral bullae. Dependent atelectasis lung bases.    2 cm left lower lobe nodule slightly increased in size from 08/30/2018, 02/18/2018  and 07/02/2025 ((prior measurement 1.7, 1.9 and 1.3 cm). No dissection thoracic  aorta. Ascending aorta measures 4.7 cm unchanged from prior studies. The  esophagus is not dilated.    No lymph node enlargement in the axilla, mediastinum or hila. No pulmonary  embolism. Visualized portions of the liver, pancreas, adrenal glands are  unremarkable. 2.3 cm splenic lesion unchanged from 02/18/2018. 1.8 cm midpole  right renal hypodensity, ultrasound characterization  suggested.    Impression  IMPRESSION:  1. No pulmonary embolism.  2. Bilateral bullae  3. Left upper lobe pneumonia/volume loss. Radiographic follow-up to resolution  suggested.  4. 2 cm left lower lobe nodule not significantly changed from prior studies.  5. 2.3 cm stable splenic lesion, unchanged from 02/18/2018.  6. 1.8 cm probable cyst midpole right kidney, ultrasound characterization  suggested.    Electronically signed by: Jolayne Panther, MD 05/11/2021 10:11 PM EST     Personally reviewed.     Gar Gibbon, AGACNP-BC  Critical Care Nurse Practitioner  May 13, 2021  7:43 AM    Critical Care Attending Attestation: 85'  I have reviewed  the patient's  chart. I saw and examined the patient, personally performed the critical or key portions of the service and discussed the management with the treatment team. I reviewed the NP's   note and concur with the history, findings and plan of care, which include's my edits and my assessment and plan. Laboratory, hemodynamic and radiological data were reviewed.  Discussed with the husband in the intensive care unit.  No events overnight.  Wakes up and follows commands.  Otherwise stable on the ventilator.  Decreased FiO2.  On exam she is stable.  Plan: As above.  Continue vent support with daily sedation holiday and pressure support trials.  Has significant lung disease so she may not do well with extubation.  If she continues to fail she may need a bronchoscopy especially in the setting of left midlung atelectasis.  Discussed the risks and benefits with the husband.  He deferred to [place].  He understands there is risk of bronchoscopy due to her significant extensive bullous disease with risk of pneumothorax but regardless she is at risk of pneumothorax being on the ventilator.  Recheck chest x-ray in a.m.  Eventually after extubation and if she does well she may be a candidate for bullectomy as an outpatient.  GI and DVT prophylaxis.  Electrolytes replacement per protocol.   Following on cultures from her respiratory sputum.  IV steroids.  Antibiotics.  Mucolytic's.  Start feeding.  Bowel regimen.  35 ' cc time spent managing this patient, reviewing records and radiography and labs, discussing care with RN, RT and other physicians involved and documenting.  This is exclusive of any procedural time.    Further evaluation and management to follow depending on clinical progression and / or more available data.   Further management of other conditions per primary team and others involved.  Marland Mcalpine, MD

## 2021-05-13 NOTE — Progress Notes (Signed)
 Daily Ventilator Progress Note      Name:  [name] [name]  Gender:  F    DOB:  February 10, 1951 Age:  70 y.o.    MRN:  [id number]  Admit Date:  05/11/2021      Primary Encounter Diagnosis:       The primary encounter diagnosis was Acute respiratory failure with hypoxia and hypercapnia (HCC). Diagnoses of Leukocytosis, unspecified type, Acute hyperkalemia, and Hypercarbia were also pertinent to this visit.      Active Problem List:     Patient Active Problem List    Diagnosis    Respiratory failure (HCC)    Acute CHF (congestive heart failure) (HCC)    UTI (urinary tract infection)    COPD exacerbation (HCC)    Acute hypoxemic respiratory failure (HCC)    Chronic respiratory failure with hypoxia (HCC)     On 3L O2 chronically.      Headache    Abdominal pain    SIRS (systemic inflammatory response syndrome) (HCC)    Hyperkalemia    Obtunded    Acute renal failure (ARF) (HCC)    Septic shock (HCC)    Metabolic encephalopathy    Dehydration    COPD with acute exacerbation (HCC)    Acute-on-chronic kidney injury (HCC)    Chest pain    Dyspnea    Type 2 diabetes mellitus with diabetic neuropathy (HCC)    Narcotic bowel syndrome (HCC)    Cannabinoid hyperemesis syndrome    Gastritis    Nausea & vomiting    Asthma with acute exacerbation    Lactic acidosis    Leukocytosis    Sepsis (HCC)    Asthma exacerbation    Type 2 diabetes mellitus with nephropathy (HCC)    Sacroiliitis (HCC)    Spondylosis of lumbar region without myelopathy or radiculopathy    Lumbar and sacral osteoarthritis    Chronic pain syndrome    Uncontrolled type 2 diabetes mellitus with hyperglycemia (HCC)    Acute colitis    Acute hyperglycemia    Accelerated hypertension    Severe headache    Osteoarthritis of hips, bilateral    PTSD (post-traumatic stress disorder)    Severe hypertension             LOS: 2 days     ICU LOS:  1d 4h     Vital signs in last 12 hours:   Patient Vitals for the past 12 hrs:   Temp Pulse Resp BP SpO2   05/13/21 0533 -- 60 12 -- 98 %    05/13/21 0400 96.8 F (36 C) 60 12 (!) 134/95 97 %   05/13/21 0330 -- 67 16 (!) 149/106 97 %   05/13/21 0300 -- 67 14 (!) 155/110 97 %   05/13/21 0200 -- (!) 56 12 (!) 134/95 98 %   05/13/21 0100 -- 62 12 (!) 139/102 97 %   05/13/21 0034 -- 61 12 -- 97 %   05/13/21 0000 -- (!) 58 12 (!) 149/103 97 %   05/12/21 2300 -- 62 12 (!) 146/95 97 %   05/12/21 2200 -- 62 12 (!) 136/94 97 %   05/12/21 2100 -- 60 16 (!) 143/100 99 %   05/12/21 2033 -- -- -- -- 100 %   05/12/21 2026 -- 61 12 -- 97 %   05/12/21 2000 97.6 F (36.4 C) (!) 59 12 (!) 157/101 96 %   05/12/21 1939 97.6 F (36.4 C) -- -- -- --  05/12/21 1900 -- 65 12 (!) 146/99 96 %          ABG Results (Last 7):     Recent Labs     05/13/21  0547 05/12/21  1657 05/12/21  0516 05/12/21  0104 05/11/21  2155 05/11/21  2021 05/11/21  1839   PH 7.481* 7.470* 7.407 7.282* 7.192* 7.201* 7.213*   PCO2 51.6* 54.4* 64.4* 95.3* 114.2* 114.3* 113.5*   PO2 89.0 51.0* 66.0* 169.0* 70.0* 59.0* 100.0   HCO3 38.5* 39.5* 40.5* 45.2* 44.1* 44.9* 45.7*   BE 15* 16* 16* 18* 16* 17* 18*   O2ST 97.0 86.0* 92.0 99.0 87.0* 81.0* 95.0   SITE R Radial R Radial L Radial L Radial L Radial L Radial R Radial   SAMPLE Art Art Art Art Art Art Art   FIO2 60 40 60 100 50 40 40   HDEV Vent Vent Vent Vent BiPAP BiPAP BiPAP            ARDS is classified according to the degree of hypoxemia (PaO2/FiO2 ratio),  mild (PaO2/FiO2, 201-300), moderate (PaO2/FiO2, 101-200), and severe (PaO2/FiO2???100)       Airway:     Airway - Endotracheal Tube 05/11/21 Oral (Active)   Placement Date/Time: 05/11/21 2315   Number of Attempts: 1  Inserted By: Jeannette Cough, MD  Present on Admission/Arrival: Yes  Location: Oral  Placement Verified: Auscultation;BBS;Chest x-ray;EtCO2  Airway Types: Endotracheal, cuffed  Airway Tube Si...   Number of days: 1       Airway - Endotracheal Tube 05/11/21 Oral (Active)   Insertion Depth (cm) 25 cm 05/13/21 0533   Line Mark Lips 05/13/21 0533   Side Secured Centered 05/13/21 0533    Cuff Pressure 26 cmH20 05/13/21 0533   Site Assessment Clean, dry, & intact 05/13/21 0533         Ventilator Settings:   Ventilator Mode: Assist control  FIO2 (%): 60 %, SpO2/FIO2 Ratio: 163.33, CMV Rate Set: 12, Vt Set (ml): 500 ml, PEEP/VENT (cm H2O): 5 cm H20, I:E Ratio: 1:4.0, Insp Time (sec): 1 sec, Insp Rise Time %: 20 %, Flow Trigger: 3.0, Exp Time (sec): 4 sec     Vt Set (ml): 500 ml Vt Exhaled (Machine Breath) (ml): 464 ml       Ventilator Pressures PIP Observed (cm H2O): 35 cm H2O, Plateau Pressure (cm H2O): 19.5 cm H2O, MAP (cm H2O): 10, PEEP/VENT (cm H2O): 5 cm H20         Safety [employer]arms Circuit Temperature: 98.6 F (37 C), Pressure Max: 40 cm H2O, Pressure Min: 10 cm H2O, Ve Min: 2, Ve Max: 18, Vt Min: 200 ml, Vt Max: 1000 ml, RR Min: 8, RR Max: 40       Breath Sounds:   Breath Sounds Bilateral: Clear     Secretions:     Suction: ET Tube  Suction Device: Inline suction catheter  Sputum Method Obtained: Endotracheal  Sputum Amount: Scant  Sputum Color/Odor: White  Sputum Consistency: Thick     Chest X-Ray:     CXR Results  (Last 48 hours)                 05/11/21 2355  XR CHEST SNGL V Final result    Impression:  IMPRESSION:   1. Opacity left perihilar regions secondary to atelectasis from   secretions/aspiration developed since radiograph of 1808 hours.   2. Large bullae left upper lobe.   3. Atelectasis left lung base.  Electronically signed by: Ripley Blanch, MD 05/12/2021 12:01 AM EST           Narrative:  Clinical history:   Endotracheal tube placement       EXAMINATION: Single view of the chest 05/11/2021       Correlation: CTA chest 05/11/2021 and chest radiograph 05/11/2021       FINDINGS:       Endotracheal tube and NG tube are in satisfactory position. Trachea and heart   size are within normal limits. Subsegmental atelectasis left lung base. Right   lung is clear.       Large bullous changes left upper lobe. Left perihilar opacity new since earlier   chest radiograph corresponds to  volume loss left upper lobe. Findings noted on   CTA chest. Volume loss may be secondary to secretions or aspiration.           05/11/21 1812  XR CHEST SNGL V Final result    Impression:  IMPRESSION: No acute cardiopulmonary process.       Electronically signed by: Kellie Guan, MD 05/11/2021 6:18 PM EST           Narrative:  ERASMO: XR CHEST SNGL V   INDICATION: SOB     COMPARISON: 01/24/2021   WORKSTATION ID: [id number]   TECHNIQUE: Frontal view.       FINDINGS:       SUPPORT DEVICES: None.       LUNGS/PLEURA: No consolidation or pleural effusion. Stable bullous changes in   the left upper lobe. Unchanged rounded calcified nodule in the left lung.       HEART/MEDIASTINUM: Cardiomediastinal silhouette is stable in appearance.       OTHER: None.                        XR Results (most recent):  Results from Hospital Encounter encounter on 05/11/21    XR CHEST SNGL V    Narrative  Clinical history:  Endotracheal tube placement    EXAMINATION: Single view of the chest 05/11/2021    Correlation: CTA chest 05/11/2021 and chest radiograph 05/11/2021    FINDINGS:    Endotracheal tube and NG tube are in satisfactory position. Trachea and heart  size are within normal limits. Subsegmental atelectasis left lung base. Right  lung is clear.    Large bullous changes left upper lobe. Left perihilar opacity new since earlier  chest radiograph corresponds to volume loss left upper lobe. Findings noted on  CTA chest. Volume loss may be secondary to secretions or aspiration.    Impression  IMPRESSION:  1. Opacity left perihilar regions secondary to atelectasis from  secretions/aspiration developed since radiograph of 1808 hours.  2. Large bullae left upper lobe.  3. Atelectasis left lung base.    Electronically signed by: Ripley Blanch, MD 05/12/2021 12:01 AM EST        Weaning Trial:   Spontaneous Breathing Trial Complete:  (started now) (05/12/21 1105)  RASS  [place] Agitation Sedation Scale (RASS): Light sedation        Summary:    ?  No ventilator changes made.       Plan:     Continue to monitor patient on (S)CMV on the venilator    Electronically Signed By: Jeral ONEIDA Grad, RT     May 13, 2021

## 2021-05-13 NOTE — Progress Notes (Signed)
[facility] Dosing Services: Vancomycin    Consult for Vancomycin Dosing by Pharmacy   Consult provided for this 70 y.o. year old female for indication of CAP.  Day of Therapy 1    Ht Readings from Last 1 Encounters:   05/11/21 170.2 cm (67")        Wt Readings from Last 1 Encounters:   05/13/21 94.2 kg (207 lb 10.8 oz)        Previous Regimen Vancomycin 1250 mg IV every 12 hours   Last Level  Latest Reference Range & Units 05/13/21 12:10   Vancomycin,trough 5.0 - 10.0 mcg/ml 15.8 (H)      Other Current Antibiotics Piperacillin-tazobactam   Significant Cultures 11/29 blood cx - gram positive bacilli  11/29 resp cx -   11/29 MRSA PCR of the nares (-)    Serum Creatinine Lab Results   Component Value Date/Time    Creatinine 0.63 05/13/2021 05:40 AM      Creatinine Clearance Estimated Creatinine Clearance: 90.6 mL/min (by C-G formula based on SCr of 0.63 mg/dL).   BUN Lab Results   Component Value Date/Time    BUN 20 05/13/2021 05:40 AM      WBC Lab Results   Component Value Date/Time    WBC 18.0 (H) 05/13/2021 05:40 AM      H/H Lab Results   Component Value Date/Time    HGB 11.1 (L) 05/13/2021 05:40 AM      Platelets Lab Results   Component Value Date/Time    PLATELET 191 05/13/2021 05:40 AM      Temp (!) 96.5 F (35.8 C)       Vancomycin trough resulted within range on current regimen; note trough drawn @ 1200, based on timing of last dose this is an approximate 14.5-hour level and corrects to 18.6 mcg/mL, estimated AUC 577 mg/L/hr. Continue current regimen, check trough in 3-5 days or sooner if change in renal and/or clinical status.     Dose calculated to approximate a therapeutic trough of 15-20 mcg/mL.      Pharmacy to follow daily and will make changes to dose and/or frequency based on clinical status.   _________________________________     Pharmacist Gearldine Shown

## 2021-05-13 NOTE — Progress Notes (Signed)
Progress Notes by Salley Slaughter, MD at 05/13/21 1308                Author: Salley Slaughter, MD  Service: Hospitalist  Author Type: Physician       Filed: 05/13/21 1206  Date of Service: 05/13/21 0931  Status: Signed          Editor: Salley Slaughter, MD (Physician)                          INTERNAL MEDICINE PROGRESS NOTE      Date of note:      May 13, 2021      Patient:               [name] [name], 70 y.o., female   Admit Date:        05/11/2021   Length of Stay:  2 day(s)      Problem List:      Patient Active Problem List        Diagnosis  Code         ?  Severe hypertension  I10     ?  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  PTSD (post-traumatic stress disorder)  F43.10     ?  Severe headache  R51.9     ?  Accelerated hypertension  I10     ?  Acute colitis  K52.9     ?  Acute hyperglycemia  R73.9     ?  Uncontrolled type 2 diabetes mellitus with hyperglycemia (HCC)  E11.65     ?  Spondylosis of lumbar region without myelopathy or radiculopathy  M47.816     ?  Lumbar and sacral osteoarthritis  M47.817     ?  Chronic pain syndrome  G89.4     ?  Sacroiliitis (HCC)  M46.1     ?  Type 2 diabetes mellitus with nephropathy (HCC)  E11.21     ?  Lactic acidosis  E87.20     ?  Leukocytosis  D72.829     ?  Sepsis (HCC)  A41.9     ?  Asthma exacerbation  J45.901     ?  Asthma with acute exacerbation  J45.901     ?  Gastritis  K29.70     ?  Nausea & vomiting  R11.2     ?  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  Cannabinoid hyperemesis syndrome  R11.2, F12.90     ?  Type 2 diabetes mellitus with diabetic neuropathy (HCC)  E11.40     ?  Chest pain  R07.9     ?  Dyspnea  R06.00     ?  Dehydration  E86.0     ?  COPD with acute exacerbation (HCC)  J44.1     ?  Acute-on-chronic kidney injury (HCC)  N17.9, N18.9     ?  Hyperkalemia  E87.5     ?  Obtunded  R40.1     ?  Acute renal failure (ARF) (HCC)  N17.9     ?  Septic shock (HCC)  A41.9, R65.21     ?  Metabolic encephalopathy  G93.41      ?  SIRS (systemic inflammatory response syndrome) (HCC)  R65.10     ?  Abdominal pain  R10.9     ?  Headache  R51.9     ?  Chronic respiratory failure with hypoxia (HCC)  J96.11     ?  COPD exacerbation (HCC)  J44.1     ?  Acute hypoxemic respiratory failure (HCC)  J96.01     ?  UTI (urinary tract infection)  N39.0     ?  Acute CHF (congestive heart failure) (HCC)  I50.9         ?  Respiratory failure (HCC)  J96.90             Subjective + interval history        Remains intubated and sedated. Did not tolerate weaning trial this morning.         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 Continue Vanc + Zosyn    Respiratory cultures in progress   Potassium level stable 4.2 - monitor          DVT ppx : Therapeutic Lovenox  GI prophylaxis-pantoprazole       Discussed with  :Dr. Madelaine Etienne       - Code status: full code       Recommend to continue hospitalization.   Expected date of discharge: tbd   Plan for disposition - tbd        Objective:           Visit Vitals      BP  (!) 150/114     Pulse  89     Temp  (!) 96.5 ??F (35.8 ??C)     Resp  16     Ht   (1.702 m)     Wt  94.2 kg (207 lb 10.8 oz)     SpO2  100%        BMI  32.53 kg/m??                    Intake/Output Summary (Last 24 hours) at 05/13/2021 0931   Last  data filed at 05/13/2021 0700     Gross per 24 hour        Intake  862.91 ml        Output  1000 ml        Net  -137.09 ml             Physical Exam:        GEN - intubated   HEENT - mucous membranes moist   Neck - supple, no JVD   Cardiac - RRR, S1, S2, no murmurs   Chest/Lungs - expiratory wheezing   Abdomen - soft, obese, non tender   Extremities - no clubbing/ cyanosis/ edema   Neuro - sedated , follows  simple commands   Skin - no rashes or lesions        Current medications:           Current Facility-Administered Medications:    ?  dexmedeTOMidine in 0.9 % NaCl (PRECEDEX) 200 mcg/50 mL (4 mcg/mL) infusion soln, 0.1-1.5 mcg/kg/hr, IntraVENous, TITRATE, Lottie Dawson, NP, Last Rate: 4.7 mL/hr at 05/13/21 0916, 0.2 mcg/kg/hr at 05/13/21 0916   ?  hydrALAZINE (APRESOLINE) 20 mg/mL injection 20 mg, 20 mg, IntraVENous, Q6H PRN, Lottie Dawson, NP, 20 mg at 05/13/21 1096   ?  propofoL (DIPRIVAN) 10 mg/mL injection, 0-50 mcg/kg/min, IntraVENous, NOW, Renaldo Harrison, Assunta Gambles, NP   ?  metoprolol (LOPRESSOR) injection 5 mg, 5 mg, IntraVENous, ONCE, Zielinski, Assunta Gambles, NP   ?  vancomycin (VANCOCIN) 1250 mg in NS 250 ml infusion, 1,250 mg, IntraVENous, Q12H, Erling Conte, MD, Last Rate: 125 mL/hr at 05/12/21  2136, 1,250 mg at 05/12/21 2136   ?  vancomycin trough due 12/1 @ 0900, , Other, ONCE, Erling Conte, MD   ?  enoxaparin (LOVENOX) injection 100 mg, 1 mg/kg, SubCUTAneous, Q12H, Otilio Carpen, MD, 100 mg at 05/13/21 0454   ?  pantoprazole (PROTONIX) 40 mg in 0.9% sodium chloride 10 mL injection, 40 mg, IntraVENous, DAILY, Lottie Dawson, NP, 40 mg at 05/13/21  0981   ?  dextrose (D50W) injection syrg 5-25 g, 10-50 mL, IntraVENous, PRN, Lottie Dawson, NP   ?  glucagon (GLUCAGEN) injection 1 mg, 1 mg, IntraMUSCular, PRN, Lottie Dawson, NP   ?  insulin glargine (LANTUS) injection 1-100 Units, 1-100 Units, SubCUTAneous, QHS, Lottie Dawson, NP, 16 Units at 05/12/21 2126   ?  insulin lispro  (HUMALOG) injection 1-100 Units, 1-100 Units, SubCUTAneous, Q6H, Zielinski, Laura C, NP, 2 Units at 05/12/21 1217   ?  sodium chloride 3% hypertonic nebulizer soln, 4 mL, Nebulization, BID RT, Imad, Melhem, MD, 4 mL at 05/13/21 0753   ?  docusate (COLACE) 50 mg/5 mL oral liquid 100 mg, 100 mg, Oral, BID, Imad, Melhem, MD, 100 mg at 05/13/21 1914   ?  piperacillin-tazobactam (ZOSYN) 3.375 g in 0.9% sodium chloride (MBP/ADV) 100 mL MBP, 3.375 g, IntraVENous, Q8H, Erling Conte, MD, Last  Rate: 25 mL/hr at 05/13/21 0122, 3.375 g at 05/13/21 0122   ?  budesonide (PULMICORT) 500 mcg/2 ml nebulizer suspension, 500 mcg, Nebulization, BID RT, Lauralee Evener, PA-C, 500 mcg at 05/13/21 7829   ?  methylPREDNISolone (PF) (SOLU-MEDROL) injection 40 mg, 40 mg, IntraVENous, Q8H, Lauralee Evener, PA-C, 40 mg at 05/13/21 0524   ?  montelukast (SINGULAIR) tablet 10 mg, 10 mg, Oral, QHS, Lauralee Evener, PA-C, 10 mg at 05/12/21 2128   ?  dextrose (D50W) injection syrg 10-15 g, 20-30 mL, IntraVENous, PRN, Lauralee Evener, PA-C   ?  white petrolatum-mineral oiL (LACRILUBE S.O.P.) ointment 1 Each, 1 Each, Both Eyes, DAILY, Erling Conte, MD, 1 Each at 05/12/21 0920   ?  PHOSPHATE ELECTROLYTE REPLACEMENT PROTOCOL STANDARD DOSING, 1 Each, Other, PRN, Coulter, Venetia Night, NP   ?  MAGNESIUM ELECTROLYTE REPLACEMENT PROTOCOL STANDARD DOSING, 1 Each, Other, PRN, Coulter, Venetia Night, NP   ?  POTASSIUM ELECTROLYTE REPLACEMENT PROTOCOL STANDARD DOSING, 1 Each, Other, PRN, Coulter, Venetia Night, NP   ?  CALCIUM ELECTROLYTE REPLACEMENT PROTOCOL STANDARD DOSING, 1 Each, Other, PRN, Coulter, Venetia Night, NP   ?  *Vancomycin dosing by pharmacy, 1 Each, Other, Rx Dosing/Monitoring, Erling Conte, MD  Labs:          Recent Results (from the past 24 hour(s))     URINALYSIS W/ RFLX MICROSCOPIC          Collection Time: 05/12/21  9:58 AM         Result  Value  Ref Range            Color  YELLOW  YELLOW,STRAW         Appearance  CLEAR  CLEAR         Glucose  NEGATIVE   NEGATIVE,Negative mg/dl       Bilirubin  NEGATIVE  NEGATIVE,Negative         Ketone  NEGATIVE  NEGATIVE,Negative mg/dl       Specific gravity  1.010  1.005 - 1.030         Blood  NEGATIVE  NEGATIVE,Negative         pH (UA)  7.5  5.0 - 9.0         Protein  30 (A)  NEGATIVE,Negative mg/dl       Urobilinogen  0.2  0.0 - 1.0 mg/dl       Nitrites  NEGATIVE  NEGATIVE,Negative         Leukocyte Esterase  NEGATIVE  NEGATIVE,Negative         S.PNEUMO AG, UR/CSF          Collection Time: 05/12/21  9:58 AM         Result  Value  Ref Range            Strep pneumo Ag, urine  NEGATIVE  NEGATIVE         LEGIONELLA PNEUMOPHILA AG, URINE          Collection Time: 05/12/21  9:58 AM       Specimen: Urine         Result  Value  Ref Range            Legionella Ag, urine  NEGATIVE  NEGATIVE         CULTURE, RESPIRATORY/SPUTUM/BRONCH W GRAM STAIN          Collection Time: 05/12/21  9:58 AM       Specimen: Endotracheal aspirate; Respiratory specimen         Result  Value  Ref Range            GRAM STAIN                  Few Squamous Epithelial Cells   Few WBC'S   Mucus Present   No Organisms Seen          DRUG SCREEN, URINE          Collection Time: 05/12/21  9:58 AM         Result  Value  Ref Range            Amphetamine  NEGATIVE  NEGATIVE         Barbiturates  NEGATIVE  NEGATIVE         Benzodiazepines  POSITIVE (A)  NEGATIVE         Cocaine  NEGATIVE  NEGATIVE         Marijuana  POSITIVE (A)  NEGATIVE         Methadone  NEGATIVE  NEGATIVE         Opiates  NEGATIVE  NEGATIVE         Phencyclidine  NEGATIVE  NEGATIVE  MRSA SCREEN - PCR (NASAL)          Collection Time: 05/12/21  9:58 AM         Result  Value  Ref Range            MRSA PCR SCREEN  NEGATIVE  NEGATIVE         POC URINE MICROSCOPIC          Collection Time: 05/12/21  9:58 AM         Result  Value  Ref Range            Epithelial cells, squamous  1-4  /LPF       WBC  1-4  /HPF       RBC  0-5  /HPF       Bacteria  1+  /HPF       GLUCOSE, POC          Collection Time:  05/12/21 11:04 AM         Result  Value  Ref Range            Glucose (POC)  245 (H)  65 - 105 mg/dL       PROTHROMBIN TIME + INR          Collection Time: 05/12/21  2:45 PM         Result  Value  Ref Range            Prothrombin time  16.2 (H)  10.2 - 12.9 seconds       INR  1.4 (H)  0.1 - 1.1         METABOLIC PANEL, BASIC          Collection Time: 05/12/21  2:45 PM         Result  Value  Ref Range            Potassium  5.0  3.5 - 5.1 mEq/L       Chloride  97 (L)  98 - 107 mEq/L       Sodium  138  136 - 145 mEq/L       CO2  35 (H)  20 - 31 mEq/L       Glucose  201 (H)  74 - 106 mg/dl       BUN  15  9 - 23 mg/dl       Creatinine  5.40  0.55 - 1.02 mg/dl       GFR est AA  >98.1          GFR est non-AA  >60          Calcium  9.5  8.7 - 10.4 mg/dl       Anion gap  6  5 - 15 mmol/L       POC BLOOD GAS + LACTIC ACID          Collection Time: 05/12/21  4:57 PM         Result  Value  Ref Range            pH  7.470 (H)  7.350 - 7.450         PCO2  54.4 (H)  35.0 - 45.0 mm Hg       PO2  51.0 (LL)  75 - 100 mm Hg       BICARBONATE  39.5 (HH)  18.0 - 26.0 mmol/L       O2 SAT  86.0 (L)  90 -  100 %       CO2, TOTAL  41.0 (H)  24 - 29 mmol/L       Lactic Acid  0.78  0.40 - 2.00 mmol/L       BASE EXCESS  16 (H)  -2 - 3 mmol/L       Patient temp.  99.0 F          Sample type  Art          FIO2  40          SITE  R Radial          DEVICE  Vent          ALLENS TEST  Pass          Respiratory Rate  12          SET RATE  12          MODE  (S)CMV          PEEP/CPAP  5          SETVT  450          VTEXP  474          GLUCOSE, POC          Collection Time: 05/12/21  5:14 PM         Result  Value  Ref Range            Glucose (POC)  185 (H)  65 - 105 mg/dL       GLUCOSE, POC          Collection Time: 05/12/21 11:58 PM         Result  Value  Ref Range            Glucose (POC)  175 (H)  65 - 105 mg/dL       METABOLIC PANEL, BASIC          Collection Time: 05/13/21  5:40 AM         Result  Value  Ref Range            Potassium  4.2  3.5 -  5.1 mEq/L       Chloride  99  98 - 107 mEq/L       Sodium  140  136 - 145 mEq/L       CO2  34 (H)  20 - 31 mEq/L       Glucose  181 (H)  74 - 106 mg/dl       BUN  20  9 - 23 mg/dl       Creatinine  1.61  0.55 - 1.02 mg/dl       GFR est AA  >09.6          GFR est non-AA  >60          Calcium  9.5  8.7 - 10.4 mg/dl       Anion gap  7  5 - 15 mmol/L       CBC W/O DIFF          Collection Time: 05/13/21  5:40 AM         Result  Value  Ref Range            WBC  18.0 (H)  4.0 - 11.0 1000/mm3       RBC  4.08  3.60 - 5.20 M/uL       HGB  11.1 (L)  13.0 -  17.2 gm/dl       HCT  94.8 (L)  54.6 - 50.0 %       MCV  86.3  80.0 - 98.0 fL       MCH  27.2  25.4 - 34.6 pg       MCHC  31.5  30.0 - 36.0 gm/dl       PLATELET  270  350 - 450 1000/mm3       MPV  11.7 (H)  6.0 - 10.0 fL       RDW-SD  47.3 (H)  36.4 - 46.3         MAGNESIUM          Collection Time: 05/13/21  5:40 AM         Result  Value  Ref Range            Magnesium  2.1  1.6 - 2.6 mg/dL       PHOSPHORUS          Collection Time: 05/13/21  5:40 AM         Result  Value  Ref Range            Phosphorus  2.8  2.4 - 5.1 mg/dL       POC BLOOD GAS + LACTIC ACID          Collection Time: 05/13/21  5:47 AM         Result  Value  Ref Range            pH  7.481 (H)  7.350 - 7.450         PCO2  51.6 (H)  35.0 - 45.0 mm Hg       PO2  89.0  75 - 100 mm Hg       BICARBONATE  38.5 (HH)  18.0 - 26.0 mmol/L       O2 SAT  97.0  90 - 100 %       CO2, TOTAL  40.0 (H)  24 - 29 mmol/L       Lactic Acid  0.62  0.40 - 2.00 mmol/L       BASE EXCESS  15 (H)  -2 - 3 mmol/L       Patient temp.  98.8 F          Sample type  Art          FIO2  60          SITE  R Radial          DEVICE  Vent          ALLENS TEST  Pass          SET RATE  12          MODE  (S)CMV          PEEP/CPAP  5          SETVT  500          GLUCOSE, POC          Collection Time: 05/13/21  6:23 AM         Result  Value  Ref Range            Glucose (POC)  173 (H)  65 - 105 mg/dL           XR Results:   Results from Hospital  Encounter encounter on 05/11/21      XR CHEST SNGL V  Narrative   Clinical history:   Endotracheal tube placement      EXAMINATION: Single view of the chest 05/11/2021      Correlation: CTA chest 05/11/2021 and chest radiograph 05/11/2021      FINDINGS:      Endotracheal tube and NG tube are in satisfactory position. Trachea and heart   size are within normal limits. Subsegmental atelectasis left lung base. Right   lung is clear.      Large bullous changes left upper lobe. Left perihilar opacity new since earlier   chest radiograph corresponds to volume loss left upper lobe. Findings noted on   CTA chest. Volume loss may be secondary to secretions or aspiration.      Impression   IMPRESSION:   1. Opacity left perihilar regions secondary to atelectasis from   secretions/aspiration developed since radiograph of 1808 hours.   2. Large bullae left upper lobe.   3. Atelectasis left lung base.      Electronically signed by: Jolayne Panther, MD 05/12/2021 12:01 AM EST         CT Results:   Results from Hospital Encounter encounter on 05/11/21      CT CHEST W CON PE PROTOCOL      Narrative   All CT exams at this facility use one or more dose reduction techniques   including automatic exposure control, mA/kV adjustment per patient's size, or   iterative reconstruction technique.      Clinical History: rule out PE.      Examination:   CTA CHEST with contrast. 3 mm spiral scanning was performed from the lung apices   to the upper pole of the kidneys. 3D MIP coronal and sagittal reconstruction   imaging has been performed.      Correlation:   08/30/2018, 02/18/2018 and 07/03/2015      Findings:      Degenerative changes of the spine. Secretions dependent portions of the trachea.   Dense airspace opacity with air bronchograms left upper lobe. There are   bilateral bullae. Dependent atelectasis lung bases.      2 cm left lower lobe nodule slightly increased in size from 08/30/2018, 02/18/2018   and 07/02/2025 ((prior measurement 1.7,  1.9 and 1.3 cm). No dissection thoracic   aorta. Ascending aorta measures 4.7 cm unchanged from prior studies. The   esophagus is not dilated.      No lymph node enlargement in the axilla, mediastinum or hila. No pulmonary   embolism. Visualized portions of the liver, pancreas, adrenal glands are   unremarkable. 2.3 cm splenic lesion unchanged from 02/18/2018. 1.8 cm midpole   right renal hypodensity, ultrasound characterization suggested.      Impression   IMPRESSION:   1. No pulmonary embolism.   2. Bilateral bullae   3. Left upper lobe pneumonia/volume loss. Radiographic follow-up to resolution   suggested.   4. 2 cm left lower lobe nodule not significantly changed from prior studies.   5. 2.3 cm stable splenic lesion, unchanged from 02/18/2018.   6. 1.8 cm probable cyst midpole right kidney, ultrasound characterization   suggested.      Electronically signed by: Jolayne Panther, MD 05/11/2021 10:11 PM EST         MRI Results:   Results from Orders Only encounter on 10/17/19      MRI LUMB SPINE W CONT         Nuclear Medicine Results:   No results found for this or any previous visit.  [place] Results:   Results from Hospital Encounter encounter on 12/24/17      [place] RUQ      Narrative   Examination: Ultrasound right upper quadrant.      INDICATION: Pain.      FINDINGS:      The liver appears grossly unremarkable. No focal hepatic lesions definitely   identified.      The gallbladder appears unremarkable. No gallbladder wall thickening. No   sonographic Murphy's sign. CBD within normal limits roughly measuring 0.3 cm.      Right kidney appears unremarkable and is normal in size. A 4 mm nonobstructing   renal calculus. Tiny cyst on previous CT dated 08/27/2016, not well visualized on   the current exam.      Proximal aorta and IVC appear unremarkable.      Impression   IMPRESSION:   1. No acute abnormalities.   2. No gallbladder wall thickening, gallstones or evidence of acute   cholecystitis.   3. Nonobstructing right  renal calculus.         IR Results:   No results found for this or any previous visit.         VAS/US Results:   Results from Hospital Encounter encounter on 05/11/21      DUPLEX UPPER EXT VENOUS LEFT      Narrative   [id number]   [id number]   [id number]   Study ID: [id number]      [facility]   [address] [place],   [place]   [address]      Upper Extremity Venous Duplex Report      Name: CLYDINE, PARKISON Date: 05/12/2021 08:42 AM   MRN: [id number]                   Patient Location: [id number]   DOB: 08-28-1950               Age: 50 yrs   Gender: Female                Account #: [id number]   Reason For Study: History of DVT. ICD-10 Z86.71   Ordering Physician: Otilio Carpen   Referring Physician: [name]   Performed By: Kathrin Ruddy, RVT      Interpretation Summary   1. No evidence of deep venous thrombosis in the left upper extremity.   2. No evidence of deep venous thrombosis in the contralateral/ right   internal jugular and subclavian veins.   3. Compared to previous exam done on 01/24/2021, the previously documented   subclavian deep vein thrombosis has now resolved.   ___________________________________________________________________________   RIGHT ARM   The right internal jugular and subclavian veins were patent and   compressible, no evidence of intraluminal thrombus. Spectral Doppler exam   demonstrates normal venous flow in the right jugular vein. Spectral   Doppler exam demonstrates normal venous flow in the right subclavian vein.      LEFT ARM   The left upper extremity veins were examined with duplex ultrasound. The   internal jugular, subclavian, axillary and brachial veins were patent and   compressible with no intraluminal thrombus identified. Spontaneous,   pulsatile flow with normal augmentation was noted throughout the left   upper extremity.      LEFT SUPERFICIAL VEINS   The cephalic and basilic veins were patent and  compressible with no   evidence of intraluminal thrombus identified.      SONOGRAPHERS COMMENTS   Technically difficult and suboptimal due to positioning and patients   inability to cooperate.      Electronically signed byDr. Charlane Ferretti, M.D.   05/12/2021 04:30 PM            Total clinical care time was 37  minutes of which more than 50% was spent in coordination of care and counseling (time spent with patient/family face to face, physical exam, reviewing laboratory and imaging investigations, speaking with physicians and  nursing staff involved in this patient's care).       Dragon medical dictation system was used for part of this note. Unintentional voice recognition errors may occur      Tia Masker,  MD   [facility]   May 13, 2021   Time: 9:31 AM

## 2021-05-13 NOTE — Progress Notes (Signed)
Daily Ventilator Progress Note      Name:  [name] [name]  Gender:  F    DOB:  August 09, 1950 Age:  70 y.o.    MRN:  [id number]  Admit Date:  05/11/2021      Primary Encounter Diagnosis:       The primary encounter diagnosis was Acute respiratory failure with hypoxia and hypercapnia (HCC). Diagnoses of Leukocytosis, unspecified type, Acute hyperkalemia, and Hypercarbia were also pertinent to this visit.      Active Problem List:     Patient Active Problem List    Diagnosis    Respiratory failure (HCC)    Acute CHF (congestive heart failure) (HCC)    UTI (urinary tract infection)    COPD exacerbation (HCC)    Acute hypoxemic respiratory failure (HCC)    Chronic respiratory failure with hypoxia (HCC)     On 3L O2 chronically.      Headache    Abdominal pain    SIRS (systemic inflammatory response syndrome) (HCC)    Hyperkalemia    Obtunded    Acute renal failure (ARF) (HCC)    Septic shock (HCC)    Metabolic encephalopathy    Dehydration    COPD with acute exacerbation (HCC)    Acute-on-chronic kidney injury (HCC)    Chest pain    Dyspnea    Type 2 diabetes mellitus with diabetic neuropathy (HCC)    Narcotic bowel syndrome (HCC)    Cannabinoid hyperemesis syndrome    Gastritis    Nausea & vomiting    Asthma with acute exacerbation    Lactic acidosis    Leukocytosis    Sepsis (HCC)    Asthma exacerbation    Type 2 diabetes mellitus with nephropathy (HCC)    Sacroiliitis (HCC)    Spondylosis of lumbar region without myelopathy or radiculopathy    Lumbar and sacral osteoarthritis    Chronic pain syndrome    Uncontrolled type 2 diabetes mellitus with hyperglycemia (HCC)    Acute colitis    Acute hyperglycemia    Accelerated hypertension    Severe headache    Osteoarthritis of hips, bilateral    PTSD (post-traumatic stress disorder)    Severe hypertension             LOS: 2 days     ICU LOS:  1d 13h     Vital signs in last 12 hours:   Patient Vitals for the past 12 hrs:   Temp Pulse Resp BP SpO2   05/13/21 1400 -- 84 11 116/85 100 %    05/13/21 1330 -- 75 12 118/88 100 %   05/13/21 1300 -- 81 16 (!) 120/96 100 %   05/13/21 1230 -- 85 12 (!) 123/97 100 %   05/13/21 1227 -- 75 12 -- 100 %   05/13/21 1200 -- 79 13 (!) 130/93 100 %   05/13/21 1100 -- 79 12 (!) 139/101 100 %   05/13/21 1030 -- 83 19 (!) 139/100 100 %   05/13/21 1015 -- 86 14 (!) 139/101 100 %   05/13/21 1000 -- 91 13 (!) 135/95 100 %   05/13/21 0955 -- (!) 113 -- (!) 143/109 --   05/13/21 0945 -- (!) 107 15 (!) 143/109 100 %   05/13/21 0930 -- (!) 107 23 (!) 150/96 100 %   05/13/21 0915 -- 92 13 -- 100 %   05/13/21 0909 -- 89 -- (!) 150/114 --   05/13/21 0900 -- 94 16 (!) 150/114  100 %   05/13/21 0845 -- (!) 102 15 (!) 152/110 100 %   05/13/21 0830 -- (!) 113 21 (!) 208/114 100 %   05/13/21 0815 -- (!) 119 22 (!) 201/147 90 %   05/13/21 0800 -- 98 20 (!) 188/123 98 %   05/13/21 0751 -- 80 15 -- 90 %   05/13/21 0745 -- 78 15 -- 98 %   05/13/21 0730 -- 65 11 -- 99 %   05/13/21 0723 (!) 96.5 F (35.8 C) -- -- -- --   05/13/21 0715 -- (!) 58 12 -- 99 %   05/13/21 0700 -- (!) 57 12 (!) 135/94 98 %   05/13/21 0600 -- 61 12 (!) 145/103 98 %   05/13/21 0533 -- 60 12 -- 98 %   05/13/21 0500 -- 60 12 (!) 136/100 98 %   05/13/21 0400 96.8 F (36 C) 60 12 (!) 134/95 97 %   05/13/21 0330 -- 67 16 (!) 149/106 97 %   05/13/21 0300 -- 67 14 (!) 155/110 97 %          ABG Results (Last 7):     Recent Labs     05/13/21  0547 05/12/21  1657 05/12/21  0516 05/12/21  0104 05/11/21  2155 05/11/21  2021 05/11/21  1839   PH 7.481* 7.470* 7.407 7.282* 7.192* 7.201* 7.213*   PCO2 51.6* 54.4* 64.4* 95.3* 114.2* 114.3* 113.5*   PO2 89.0 51.0* 66.0* 169.0* 70.0* 59.0* 100.0   HCO3 38.5* 39.5* 40.5* 45.2* 44.1* 44.9* 45.7*   BE 15* 16* 16* 18* 16* 17* 18*   O2ST 97.0 86.0* 92.0 99.0 87.0* 81.0* 95.0   SITE R Radial R Radial L Radial L Radial L Radial L Radial R Radial   SAMPLE Art Art Art Art Art Art Art   FIO2 60 40 60 100 50 40 40   HDEV Vent Vent Vent Vent BiPAP BiPAP BiPAP            ARDS is classified  according to the degree of hypoxemia (PaO2/FiO2 ratio),  mild (PaO2/FiO2, 201-300), moderate (PaO2/FiO2, 101-200), and severe (PaO2/FiO2???100)       Airway:     Airway - Endotracheal Tube 05/11/21 Oral (Active)   Placement Date/Time: 05/11/21 2315   Number of Attempts: 1  Inserted By: Sunday Spillers, MD  Present on Admission/Arrival: Yes  Location: Oral  Placement Verified: Auscultation;BBS;Chest x-ray;EtCO2  Airway Types: Endotracheal, cuffed  Airway Tube Si...   Number of days: 1       Airway - Endotracheal Tube 05/11/21 Oral (Active)   Insertion Depth (cm) 25 cm 05/13/21 1227   Line Mark Lips 05/13/21 0751   Side Secured Right 05/13/21 1227   Cuff Pressure 26 cmH20 05/13/21 1227   Site Assessment Clean, dry, & intact 05/13/21 1227         Ventilator Settings:   Ventilator Mode: Volume control;Assist control  FIO2 (%): 30 %, SpO2/FIO2 Ratio: 333.33, CMV Rate Set: 12, Back-Up Rate: 15, Vt Set (ml): 450 ml, Pressure Support (cm H2O): 10 cm H2O, PEEP/VENT (cm H2O): 5 cm H20, I:E Ratio: 1:4, Insp Time (sec): 1 sec, Insp Flow (l/min): 40.5 l/min, Flow Trigger: 3, Exp Time (sec): 4 sec, Exp Flow (l/min): 45.8 l/sec     Vt Set (ml): 450 ml Vt Exhaled (Machine Breath) (ml): 392 ml Vt Spont (ml): 325 ml     Ventilator Pressures Pressure Support (cm H2O): 10 cm H2O, PIP Observed (cm H2O): 35  cm H2O, Plateau Pressure (cm H2O): 22.9 cm H2O, MAP (cm H2O): 11, PEEP/VENT (cm H2O): 5 cm H20, Auto PEEP Observed (cm H2O): 1.1 cm H2O         Safety & Alarms Circuit Temperature: 98.6 F (37 C), Backup Mode Checked/Apnea: Yes, Pressure Max: 40 cm H2O, Pressure Min: 10 cm H2O, Ve Min: 2, Ve Max: 18, Vt Min: 150 ml, Vt Max: 1000 ml, RR Min: 5, RR Max: 40       Breath Sounds:   Breath Sounds Bilateral: Clear, Diminished     Secretions:     Suction: ET Tube  Suction Device: Inline suction catheter  Sputum Method Obtained: Endotracheal  Sputum Amount: Moderate  Sputum Color/Odor: White  Sputum Consistency: Thick     Chest X-Ray:     CXR  Results  (Last 48 hours)                 05/11/21 2355  XR CHEST SNGL V Final result    Impression:  IMPRESSION:   1. Opacity left perihilar regions secondary to atelectasis from   secretions/aspiration developed since radiograph of 1808 hours.   2. Large bullae left upper lobe.   3. Atelectasis left lung base.       Electronically signed by: Jolayne Panther, MD 05/12/2021 12:01 AM EST           Narrative:  Clinical history:   Endotracheal tube placement       EXAMINATION: Single view of the chest 05/11/2021       Correlation: CTA chest 05/11/2021 and chest radiograph 05/11/2021       FINDINGS:       Endotracheal tube and NG tube are in satisfactory position. Trachea and heart   size are within normal limits. Subsegmental atelectasis left lung base. Right   lung is clear.       Large bullous changes left upper lobe. Left perihilar opacity new since earlier   chest radiograph corresponds to volume loss left upper lobe. Findings noted on   CTA chest. Volume loss may be secondary to secretions or aspiration.           05/11/21 1812  XR CHEST SNGL V Final result    Impression:  IMPRESSION: No acute cardiopulmonary process.       Electronically signed by: Albin Felling, MD 05/11/2021 6:18 PM EST           Narrative:  Francia Greaves: XR CHEST SNGL V   INDICATION: SOB     COMPARISON: 01/24/2021   WORKSTATION ID: [id number]   TECHNIQUE: Frontal view.       FINDINGS:       SUPPORT DEVICES: None.       LUNGS/PLEURA: No consolidation or pleural effusion. Stable bullous changes in   the left upper lobe. Unchanged rounded calcified nodule in the left lung.       HEART/MEDIASTINUM: Cardiomediastinal silhouette is stable in appearance.       OTHER: None.                        XR Results (most recent):  Results from Hospital Encounter encounter on 05/11/21    XR CHEST SNGL V    Narrative  Clinical history:  Endotracheal tube placement    EXAMINATION: Single view of the chest 05/11/2021    Correlation: CTA chest 05/11/2021 and chest  radiograph 05/11/2021    FINDINGS:    Endotracheal tube and NG tube are in satisfactory  position. Trachea and heart  size are within normal limits. Subsegmental atelectasis left lung base. Right  lung is clear.    Large bullous changes left upper lobe. Left perihilar opacity new since earlier  chest radiograph corresponds to volume loss left upper lobe. Findings noted on  CTA chest. Volume loss may be secondary to secretions or aspiration.    Impression  IMPRESSION:  1. Opacity left perihilar regions secondary to atelectasis from  secretions/aspiration developed since radiograph of 1808 hours.  2. Large bullae left upper lobe.  3. Atelectasis left lung base.    Electronically signed by: Jolayne Panther, MD 05/12/2021 12:01 AM EST        Weaning Trial:   Spontaneous Breathing Trial Complete: Yes (SBT PS=10 PEEP=5 O2=30%) (05/13/21 0751)  RASS  [place] Agitation Sedation Scale (RASS): Drowsy      Weaning Parameters:    Resp Rate Observed: 19 (05/13/21 0751)   Ve: 5.8  VT: 281   RSBI: 76  NIF: -38  PEF: 20   VC: 473       Summary:   Attempted SBT ,however pt required increased P.S. due to W.O.B.  Pt placed back in S/CMV mode and tolerating well.    ?            Plan:     Continue to monitor patient on (S)CMV on the venilator  Wean ventilator as tolerated    Electronically Signed By: Thomasenia Bottoms, RT     May 13, 2021

## 2021-05-13 NOTE — Progress Notes (Signed)
Problem: Ventilator Management  Goal: *Adequate oxygenation and ventilation  Outcome: Progressing Towards Goal  Goal: *Patient maintains clear airway/free of aspiration  Outcome: Progressing Towards Goal  Goal: *Absence of infection signs and symptoms  Outcome: Progressing Towards Goal  Goal: *Normal spontaneous ventilation  Outcome: Progressing Towards Goal

## 2021-05-14 ENCOUNTER — Inpatient Hospital Stay: Admit: 2021-05-14 | Payer: MEDICARE | Primary: Physician Assistant

## 2021-05-14 ENCOUNTER — Inpatient Hospital Stay: Payer: MEDICARE | Attending: Physician Assistant | Primary: Physician Assistant

## 2021-05-14 LAB — POC BLOOD GAS + LACTIC ACID
BASE EXCESS: 14 mmol/L — ABNORMAL HIGH (ref <=3)
BASE EXCESS: 15 mmol/L — ABNORMAL HIGH (ref <=3)
BICARBONATE: 37.4 mmol/L — CR (ref 18.0–26.0)
BICARBONATE: 37.1 mmol/L — CR (ref 18.0–26.0)
Base Excess: 14 mmol/L — ABNORMAL HIGH (ref <=3)
Base Excess: 15 mmol/L — ABNORMAL HIGH (ref <=3)
CO2 Total: 39 mmol/L — ABNORMAL HIGH (ref 24–29)
CO2 Total: 38 mmol/L — ABNORMAL HIGH (ref 24–29)
CO2, TOTAL: 39 mmol/L — ABNORMAL HIGH (ref 24–29)
CO2, TOTAL: 38 mmol/L — ABNORMAL HIGH (ref 24–29)
FIO2: 30
FIO2: 30
FIO2: 35
FIO2: 35
HCO3: 37.4 mmol/L (ref 18.0–26.0)
HCO3: 37.1 mmol/L (ref 18.0–26.0)
LACTIC ACID: 0.7 mmol/L (ref 0.40–2.00)
LACTIC ACID: 1.14 mmol/L (ref 0.40–2.00)
Lactic Acid: 0.7 mmol/L (ref 0.40–2.00)
Lactic Acid: 1.14 mmol/L (ref 0.40–2.00)
O2 SAT: 92 % (ref 90–100)
O2 SAT: 94 % (ref 90–100)
O2 Sat: 92 % (ref 90–100)
O2 Sat: 94 % (ref 90–100)
PCO2: 50.6 mm Hg — ABNORMAL HIGH (ref 35.0–45.0)
PCO2: 50.6 mm Hg — ABNORMAL HIGH (ref 35.0–45.0)
PCO2: 40.6 mm Hg (ref 35.0–45.0)
PCO2: 40.6 mm Hg (ref 35.0–45.0)
PEEP/CPAP: 5
PEEP/CPAP: 5
PO2: 62 mm Hg — ABNORMAL LOW (ref 75–100)
PO2: 62 mm Hg — ABNORMAL LOW (ref 75–100)
PO2: 61 mm Hg — ABNORMAL LOW (ref 75–100)
PO2: 61 mm Hg — ABNORMAL LOW (ref 75–100)
PRESSURE CONTROL: 22
Peep/Cpap: 5
Peep/Cpap: 5
Pressure control: 22
RESPIRATORY RATE: 75
RESPIRATORY RATE: 14
Respiratory Rate: 75
Respiratory Rate: 14
SET RATE: 14
SET RATE: 14
Set Rate: 14
Set Rate: 14
VTARGET, VTARGET: 400
VTARGET: 400
VTEXP, VTEXP: 330
VTEXP, VTEXP: 387
VTEXP: 330
VTEXP: 387
pH: 7.477 — ABNORMAL HIGH (ref 7.350–7.450)
pH: 7.477 — ABNORMAL HIGH (ref 7.350–7.450)
pH: 7.569 (ref 7.350–7.450)
pH: 7.569 — CR (ref 7.350–7.450)

## 2021-05-14 LAB — BASIC METABOLIC PANEL
Anion Gap: 8 mmol/L (ref 5–15)
BUN: 21 mg/dl (ref 9–23)
CO2: 33 mEq/L — ABNORMAL HIGH (ref 20–31)
Calcium: 9.4 mg/dl (ref 8.7–10.4)
Chloride: 101 mEq/L (ref 98–107)
Creatinine: 0.63 mg/dl (ref 0.55–1.02)
EGFR IF NonAfrican American: >60
GFR African American: >60
Glucose: 160 mg/dl — ABNORMAL HIGH (ref 74–106)
Potassium: 4.1 mEq/L (ref 3.5–5.1)
Sodium: 142 mEq/L (ref 136–145)

## 2021-05-14 LAB — CBC WITH AUTO DIFFERENTIAL
Basophils %: 0.1 % (ref 0–3)
Eosinophils %: 0 % (ref 0–5)
Hematocrit: 36.6 % — ABNORMAL LOW (ref 37.0–50.0)
Hemoglobin: 11.4 gm/dl — ABNORMAL LOW (ref 13.0–17.2)
Immature Granulocytes: 0.7 % (ref 0.0–3.0)
Lymphocytes %: 4.5 % — ABNORMAL LOW (ref 28–48)
MCH: 27 pg (ref 25.4–34.6)
MCHC: 31.1 gm/dl (ref 30.0–36.0)
MCV: 86.7 fL (ref 80.0–98.0)
MPV: 12 fL — ABNORMAL HIGH (ref 6.0–10.0)
Monocytes %: 6.4 % (ref 1–13)
Neutrophils %: 88.3 % — ABNORMAL HIGH (ref 34–64)
Nucleated RBCs: 0 (ref 0–0)
Platelets: 178 10*3/uL (ref 140–450)
RBC: 4.22 M/uL (ref 3.60–5.20)
RDW-SD: 48.2 — ABNORMAL HIGH (ref 36.4–46.3)
WBC: 13.8 10*3/uL — ABNORMAL HIGH (ref 4.0–11.0)

## 2021-05-14 LAB — POCT GLUCOSE
POC Glucose: 162 mg/dL — ABNORMAL HIGH (ref 65–105)
POC Glucose: 176 mg/dL — ABNORMAL HIGH (ref 65–105)
POC Glucose: 231 mg/dL — ABNORMAL HIGH (ref 65–105)
POC Glucose: 186 mg/dL — ABNORMAL HIGH (ref 65–105)

## 2021-05-14 LAB — TRIGLYCERIDES: Triglycerides: 131 mg/dl (ref 0–150)

## 2021-05-14 LAB — CBC WITH AUTOMATED DIFF
BASOPHILS: 0.1 % (ref 0–3)
EOSINOPHILS: 0 % (ref 0–5)
HCT: 36.6 % — ABNORMAL LOW (ref 37.0–50.0)
HGB: 11.4 gm/dl — ABNORMAL LOW (ref 13.0–17.2)
IMMATURE GRANULOCYTES: 0.7 % (ref 0.0–3.0)
LYMPHOCYTES: 4.5 % — ABNORMAL LOW (ref 28–48)
MCH: 27 pg (ref 25.4–34.6)
MCHC: 31.1 gm/dl (ref 30.0–36.0)
MCV: 86.7 fL (ref 80.0–98.0)
MONOCYTES: 6.4 % (ref 1–13)
MPV: 12 fL — ABNORMAL HIGH (ref 6.0–10.0)
NEUTROPHILS: 88.3 % — ABNORMAL HIGH (ref 34–64)
NRBC: 0 (ref 0–0)
PLATELET: 178 10*3/uL (ref 140–450)
RBC: 4.22 M/uL (ref 3.60–5.20)
RDW-SD: 48.2 — ABNORMAL HIGH (ref 36.4–46.3)
WBC: 13.8 10*3/uL — ABNORMAL HIGH (ref 4.0–11.0)

## 2021-05-14 LAB — METABOLIC PANEL, BASIC
Anion gap: 8 mmol/L (ref 5–15)
BUN: 21 mg/dl (ref 9–23)
CO2: 33 mEq/L — ABNORMAL HIGH (ref 20–31)
Calcium: 9.4 mg/dl (ref 8.7–10.4)
Chloride: 101 mEq/L (ref 98–107)
Creatinine: 0.63 mg/dl (ref 0.55–1.02)
GFR est AA: >60
GFR est non-AA: >60
Glucose: 160 mg/dl — ABNORMAL HIGH (ref 74–106)
Potassium: 4.1 mEq/L (ref 3.5–5.1)
Sodium: 142 mEq/L (ref 136–145)

## 2021-05-14 LAB — GLUCOSE, POC
Glucose (POC): 162 mg/dL — ABNORMAL HIGH (ref 65–105)
Glucose (POC): 176 mg/dL — ABNORMAL HIGH (ref 65–105)
Glucose (POC): 231 mg/dL — ABNORMAL HIGH (ref 65–105)
Glucose (POC): 186 mg/dL — ABNORMAL HIGH (ref 65–105)

## 2021-05-14 LAB — TRIGLYCERIDE: Triglyceride: 131 mg/dl (ref 0–150)

## 2021-05-14 MED ORDER — FENTANYL CITRATE (PF) 50 MCG/ML IJ SOLN
50 mcg/mL | INTRAMUSCULAR | Status: AC | PRN
Start: 2021-05-14 — End: 2021-05-19
  Administered 2021-05-14 – 2021-05-19 (×7): via INTRAVENOUS

## 2021-05-14 MED ORDER — SODIUM CHLORIDE 3 % NEB SOLUTION
3 % | Freq: Three times a day (TID) | RESPIRATORY_TRACT | Status: AC
Start: 2021-05-14 — End: 2021-05-18
  Administered 2021-05-14 – 2021-05-18 (×12): via RESPIRATORY_TRACT

## 2021-05-14 MED ORDER — HEPARIN (PORCINE) 5,000 UNIT/ML IJ SOLN
5000 unit/mL | Freq: Three times a day (TID) | INTRAMUSCULAR | Status: AC
Start: 2021-05-14 — End: 2021-05-26
  Administered 2021-05-14 – 2021-05-25 (×34): via SUBCUTANEOUS

## 2021-05-14 MED ORDER — NOREPINEPHRINE BITARTRATE 1 MG/ML IV
1 mg/mL | INTRAVENOUS | Status: AC
Start: 2021-05-14 — End: 2021-05-18

## 2021-05-14 MED ORDER — SODIUM CHLORIDE 0.9 % IV
500 mg | Freq: Four times a day (QID) | INTRAVENOUS | Status: AC
Start: 2021-05-14 — End: 2021-05-15
  Administered 2021-05-14 – 2021-05-15 (×2): via INTRAVENOUS

## 2021-05-14 MED ORDER — IPRATROPIUM-ALBUTEROL 2.5 MG-0.5 MG/3 ML NEB SOLUTION
2.5 mg-0.5 mg/3 ml | Freq: Three times a day (TID) | RESPIRATORY_TRACT | Status: DC
Start: 2021-05-14 — End: 2021-05-14

## 2021-05-14 MED ORDER — IPRATROPIUM-ALBUTEROL 2.5 MG-0.5 MG/3 ML NEB SOLUTION
2.5 mg-0.5 mg/3 ml | Freq: Three times a day (TID) | RESPIRATORY_TRACT | Status: AC
Start: 2021-05-14 — End: 2021-05-17
  Administered 2021-05-14 – 2021-05-17 (×8): via RESPIRATORY_TRACT

## 2021-05-14 MED ORDER — FENTANYL CITRATE (PF) 50 MCG/ML IJ SOLN
50 mcg/mL | Freq: Once | INTRAMUSCULAR | Status: AC
Start: 2021-05-14 — End: 2021-05-14
  Administered 2021-05-14: 07:00:00 via INTRAVENOUS

## 2021-05-14 MED FILL — VANCOMYCIN IN 0.9 % SODIUM CHLORIDE 1.25 GRAM/250 ML IV: 1.25 gram/250 mL | INTRAVENOUS | Qty: 250

## 2021-05-14 MED FILL — ACETAZOLAMIDE 500 MG SOLUTION FOR INJECTION: 500 mg | INTRAMUSCULAR | Qty: 500

## 2021-05-14 MED FILL — IPRATROPIUM-ALBUTEROL 2.5 MG-0.5 MG/3 ML NEB SOLUTION: 2.5 mg-0.5 mg/3 ml | RESPIRATORY_TRACT | Qty: 3

## 2021-05-14 MED FILL — PIPERACILLIN-TAZOBACTAM 3.375 GRAM IV SOLR: 3.375 gram | INTRAVENOUS | Qty: 3.38

## 2021-05-14 MED FILL — DOCUSATE SODIUM 50 MG/5 ML ORAL LIQUID: 50 mg/5 mL | ORAL | Qty: 10

## 2021-05-14 MED FILL — SODIUM CHLORIDE 3 % NEB SOLUTION: 3 % | RESPIRATORY_TRACT | Qty: 15

## 2021-05-14 MED FILL — DEXMEDETOMIDINE 200 MCG/50 ML (4 MCG/ML) IN 0.9 % SODIUM CHLORIDE IV: 200 mcg/50 mL (4 mcg/mL) | INTRAVENOUS | Qty: 50

## 2021-05-14 MED FILL — SOLU-MEDROL (PF) 40 MG/ML SOLUTION FOR INJECTION: 40 mg/mL | INTRAMUSCULAR | Qty: 1

## 2021-05-14 MED FILL — ENOXAPARIN 100 MG/ML SUB-Q SYRINGE: 100 mg/mL | SUBCUTANEOUS | Qty: 1

## 2021-05-14 MED FILL — PANTOPRAZOLE 40 MG IV SOLR: 40 mg | INTRAVENOUS | Qty: 40

## 2021-05-14 MED FILL — DIPRIVAN 10 MG/ML INTRAVENOUS EMULSION: 10 mg/mL | INTRAVENOUS | Qty: 100

## 2021-05-14 MED FILL — FENTANYL CITRATE (PF) 50 MCG/ML IJ SOLN: 50 mcg/mL | INTRAMUSCULAR | Qty: 2

## 2021-05-14 MED FILL — NOREPINEPHRINE BITARTRATE 1 MG/ML IV: 1 mg/mL | INTRAVENOUS | Qty: 16

## 2021-05-14 MED FILL — BUDESONIDE 0.5 MG/2 ML NEB SUSPENSION: 0.5 mg/2 mL | RESPIRATORY_TRACT | Qty: 1

## 2021-05-14 MED FILL — HEPARIN (PORCINE) 5,000 UNIT/ML IJ SOLN: 5000 unit/mL | INTRAMUSCULAR | Qty: 1

## 2021-05-14 MED FILL — MONTELUKAST 10 MG TAB: 10 mg | ORAL | Qty: 1

## 2021-05-14 MED FILL — HYDRALAZINE 20 MG/ML IJ SOLN: 20 mg/mL | INTRAMUSCULAR | Qty: 1

## 2021-05-14 NOTE — Progress Notes (Signed)
Bedside and Verbal shift change report given to Adela Lank RN (oncoming nurse) by Paulino Rily and Nelva Bush RN (offgoing nurse). Report included the following information SBAR, Kardex, MAR, Recent Results, and Cardiac Rhythm ST .

## 2021-05-14 NOTE — Interval H&P Note (Signed)
BRONCHOSCOPY PREFORMED AT BEDSIDE IN ICU 9. PATIENT IS INTUBATED WITH 7.5 ET TUBE. MD AWARE PRIOR TO PROCEDURE. DURING TIME OUT, INPATIENT WAS GIVEN HER MORNING DOSE OF HEPARIN THIS MORNING AT 0806. PROCEDURE STARTED. BLOOD NOTED IN MAIN BRONCHIALS. PROCEDURE STOPPED.

## 2021-05-14 NOTE — Progress Notes (Signed)
Problem: Ventilator Management  Goal: *Adequate oxygenation and ventilation  Outcome: Progressing Towards Goal  Goal: *Patient maintains clear airway/free of aspiration  Outcome: Progressing Towards Goal  Goal: *Absence of infection signs and symptoms  Outcome: Progressing Towards Goal  Goal: *Normal spontaneous ventilation  Outcome: Progressing Towards Goal

## 2021-05-14 NOTE — Progress Notes (Signed)
Problem: Non-Violent Restraints  Goal: Removal from restraints as soon as assessed to be safe  Outcome: Progressing Towards Goal     Problem: Pressure Injury - Risk of  Goal: *Prevention of pressure injury  Description: Document Braden Scale and appropriate interventions in the flowsheet.  Outcome: Progressing Towards Goal  Note: Pressure Injury Interventions:  Sensory Interventions: Assess changes in LOC, Avoid rigorous massage over bony prominences, Check visual cues for pain, Keep linens dry and wrinkle-free, Float heels    Moisture Interventions: Apply protective barrier, creams and emollients, Check for incontinence Q2 hours and as needed, Absorbent underpads, Maintain skin hydration (lotion/cream)    Activity Interventions: Pressure redistribution bed/mattress(bed type)    Mobility Interventions: Pressure redistribution bed/mattress (bed type), HOB 30 degrees or less, Turn and reposition approx. every two hours(pillow and wedges)    Nutrition Interventions: Document food/fluid/supplement intake    Friction and Shear Interventions: Apply protective barrier, creams and emollients, HOB 30 degrees or less                Problem: Falls - Risk of  Goal: *Absence of Falls  Description: Document Schmid Fall Risk and appropriate interventions in the flowsheet.  Outcome: Progressing Towards Goal  Note: Fall Risk Interventions:       Mentation Interventions: Adequate sleep, hydration, pain control, Bed/chair exit alarm, More frequent rounding    Medication Interventions: Bed/chair exit alarm, Evaluate medications/consider consulting pharmacy    Elimination Interventions: Toileting schedule/hourly rounds, Bed/chair exit alarm              Problem: Ventilator Management  Goal: *Adequate oxygenation and ventilation  Outcome: Progressing Towards Goal

## 2021-05-14 NOTE — Progress Notes (Signed)
Progress  Notes by Marland Mcalpine, MD at 05/14/21 1403                Author: Marland Mcalpine, MD  Service: Pulmonary Disease  Author Type: Physician       Filed: 05/14/21 1515  Date of Service: 05/14/21 1403  Status: Addendum          Editor: Marland Mcalpine, MD (Physician)          Related Notes: Original Note by Tiffany Kocher, MD (Physician) filed at 05/14/21 1417                     [facility]      ICU Daily Progress Note 35'      Patient: [name] [name]               Sex: female          DOA: 05/11/2021   Date of Birth:  [birth year]       Age:  70 y.o.        LOS:  LOS: 3 days          Code Status: Full Code          [x] I have reviewed  the flowsheet and previous days notes. Events, vitals, medications and notes from last 24 hours reviewed.         ASSESSMENT:     - Acute hypoxic hypercapnic respiratory failure requiring intubation likely secondary to pneumonia - h/o intubation in the past -  Currently intubated 05/11/21.     - CAP with L mucus plugging / atelectasis.  H/o same in 2020 on CT.     - Acute COPD exacerbation - with chronic COPD despite no smoking hx - followed previously by Dr. 2021, now Dr. Helene Shoe.     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    - Hx Lung nodule - 05/11/21 CTA Chest with LLL nodule (2 cm) - Chronic stable s/p CT guided bx in 2017   - Hx HTN - Clonidine 0.2 mg BID, Losartan 100 mg daily, Diltiazem CD 240 mg daily   - Hx HLD - Crestor 5 mg daily   - Hx DMII   - Hx Thoracic ascending aortic aneurysm   - Hx Hepatitis C   - Hx LUE/Subclavian DVT - Eliquis    - Hx PTSD   - Hx Chronic pain         PLAN:        - Currently on mechanical ventilator.  Wean FiO2 to keep SpO2 > 88%. Plan for  bronchoscopy when clinically appropriate. Transition chronic eliquis (hx of LUE DVT) to subq heparin prior to bronchoscopy. Extensive bullous lung disease; patient's husband  verbalizes understanding of bronchoscopy risks and accepts plan to proceed when able.   - Sedation: precedex and propofol. Titrate to RASS goal -1 to 0.   - Continue Duonebs q6, Pulmicort BID, Hypertonic Saline nebs BID x 6 doses, Singulair 10 mg daily and Solumedrol 40 mg IV q8.    - Goal MAP > 65 and SBP 100-140.   - Continue Zosyn and Vancomycin IV. Blood culture 11/29  1 out of 2 - Gram positive bacilli; corynebacterium.  Consider discontinuing vancomycin tomorrow 12/3. De-escalate antibiotics based on clinical progression and final C&S results.  MRSA (-); 05/12/21  SCx rare commensal flora   - Avoid nephrotoxins. Renally dose medications. Strict I/Os. Daily weights.    - Electrolyte replacement per protocol. Goal K > 4, Phos > 3 and Mg > 2   - Glucommander per protocol. Goal BS 140-180   - SUP: Protonix 40 mg IV daily   - DVT prophylaxis: SCDs and SQ Lovenox 100 mg daily   - CBC, BMP, Magnesium and Phosphorus in am.    - Will defer respective systems problem management to primary and other consultant and follow patient in ICU with primary and other medical team      My assessment, plan of care, findings, medications, side effects etc were discussed with:      [x]  nursing  []  PT/OT      [x]  respiratory therapy  [x] Dr.        [x]  family  []         [x] Total critical care time exclusive of procedures  35 minutes with complex decision making performed and > 50% time spent in face to face consultation.        Interval HISTORY        Interval History/Events:   No acute overnight events.         ROS        Review of Systems    Unable to perform ROS: Acuity of condition          Physical Exam        Sedated on vent   GENERAL: Awake, alert on vent   NECK: Supple. Trachea midline.    RESPIRATORY: On Vent. Bilateral BS present, decreased at bases.  No rales or rhonchi.    CARDIOVASCULAR: S1 and S2 present, regular. No murmur, rub, or thrill.    ABDOMEN: Soft and nontender with positive bowel sounds. No organomegaly.    NEUROLOGICAL: Awake, follows commands      Ventilator Settings:          Mode  Rate  Tidal Volume  Pressure  FiO2  PEEP            Other (comment) (APVcmv)     400 ml (Target)   10 cm H2O  60 %  5 cm H20        Imaging:      XR Results (most recent):   Results from Hospital Encounter encounter on 05/11/21      XR CHEST SNGL V      Narrative   Exam: AP portable chest      Clinical indication: respiratory failure - ICU patient      Comparison: 05/11/2021;      Results:  Stable wedge-shaped infiltrate left mid chest. No change left lower   lobe lung nodule 18 mm. Large bulla in the left upper lobe.   No pneumothorax.   Endotracheal tube and nasogastric tube in customary position. The distal extent   of nasogastric tube not included on the film and cannot be evaluated.      Heart normal.  Mediastinal contours are normal.      No free air is seen under the hemidiaphragms.   Osseous structures intact.      Impression   IMPRESSION:   1. No change in the triangular infiltrates left mid chest and left lung nodule.   2. Stable support  tubes. Distal extent nasogastric tube cannot be evaluated. Not   on the film.   3. Large left upper lobe bulla.      Electronically signed by: Earline Mayotte, MD 05/14/2021 5:03 AM EST       CT Results (most recent):   Results from Hospital Encounter encounter on 05/11/21      CT CHEST W CON PE PROTOCOL      Narrative   All CT exams at this facility use one or more dose reduction techniques   including automatic exposure control, mA/kV adjustment per patient's size, or   iterative reconstruction technique.      Clinical History: rule out PE.      Examination:   CTA CHEST with contrast. 3 mm spiral scanning was performed from the lung apices   to the upper pole of the kidneys. 3D MIP coronal and sagittal reconstruction    imaging has been performed.      Correlation:   08/30/2018, 02/18/2018 and 07/03/2015      Findings:      Degenerative changes of the spine. Secretions dependent portions of the trachea.   Dense airspace opacity with air bronchograms left upper lobe. There are   bilateral bullae. Dependent atelectasis lung bases.      2 cm left lower lobe nodule slightly increased in size from 08/30/2018, 02/18/2018   and 07/02/2025 ((prior measurement 1.7, 1.9 and 1.3 cm). No dissection thoracic   aorta. Ascending aorta measures 4.7 cm unchanged from prior studies. The   esophagus is not dilated.      No lymph node enlargement in the axilla, mediastinum or hila. No pulmonary   embolism. Visualized portions of the liver, pancreas, adrenal glands are   unremarkable. 2.3 cm splenic lesion unchanged from 02/18/2018. 1.8 cm midpole   right renal hypodensity, ultrasound characterization suggested.      Impression   IMPRESSION:   1. No pulmonary embolism.   2. Bilateral bullae   3. Left upper lobe pneumonia/volume loss. Radiographic follow-up to resolution   suggested.   4. 2 cm left lower lobe nodule not significantly changed from prior studies.   5. 2.3 cm stable splenic lesion, unchanged from 02/18/2018.   6. 1.8 cm probable cyst midpole right kidney, ultrasound characterization   suggested.      Electronically signed by: Jolayne Panther, MD 05/11/2021 10:11 PM EST          Personally reviewed.       Tiffany Kocher, MD   [facility], PGY-2   May 14, 2021      Critical Care Attending Attestation: 5'   I have reviewed  the patient's  chart. I saw and examined the patient, personally performed the critical or key portions of the service and discussed the management with the treatment team. I reviewed  the NP's   note and concur with the history, findings and plan of care, which include's my edits and my assessment and plan. Laboratory, hemodynamic and radiological data were reviewed. Remains restless during sedation holiday with marginal  volumed requiring  up to 16 of PS on weaning trials.  Overnight on Precedex / Propofol.  Awake and following commands this AM.      Some suctioning of bloody secretions by RT this morning. CXR unchanged.    Discussed with husband regarding bronchoscopy for inspection.  She received Lovenox this AM but brief look to clear any secretions in the tubing / major airways and inspect if any blood or mucus plug.  He agreed.      PVLs upper ext negative yesterday.      Hemodynamics stable.     Plan: as above.  Bronch for inspection (later done very briefly with inspection into the main R and L main.  Blood secretions noted from far into the L main / LUL so we did not do any suction and the bronchoscope was withdrawn to avoid any irritation.   Bronchoscope did not touch any airway and procedure was aborted until Lovenox is d/ced.          Hold off on diuresis.  ABG done and reviewed. Will give some Diamox.     Continue aggressive nebs / chest PT.     GI / DVT proph.      Further evaluation and management to follow depending on clinical progression and / or more available data.    Further management of other conditions per primary team and others involved.   35 ' cc time spent managing this patient, reviewing records and radiography and labs, discussing care with RN, RT and other physicians involved and documenting.  This is exclusive of any procedural  time.     Marland Mcalpine, MD        Recent Labs             05/14/21   0136  05/13/21   0540  05/12/21   1445     CREA  0.63  0.63  0.63          BUN  21  20  15         Intake/Output Summary (Last 24 hours) at 05/14/2021 1515   Last data filed at 05/14/2021 0600     Gross per 24 hour        Intake  439.82 ml        Output  700 ml        Net  -260.18 ml          Recent Labs             05/14/21   0136  05/13/21   0540  05/12/21   0155     HGB  11.4*  11.1*  11.2*          HCT  36.6*  35.2*  38.4          Recent Labs             05/14/21   0136  05/13/21   0540  05/12/21   0155           WBC  13.8*  18.0*  18.6*        Dragon medical dictation software was used for portions of this report. Unintended voice transcription errors may have occurred.

## 2021-05-14 NOTE — Progress Notes (Signed)
Progress Notes by Salley Slaughter, MD at 05/14/21 1497                Author: Salley Slaughter, MD  Service: Hospitalist  Author Type: Physician       Filed: 05/14/21 1341  Date of Service: 05/14/21 0752  Status: Signed          Editor: Salley Slaughter, MD (Physician)                          INTERNAL MEDICINE PROGRESS NOTE      Date of note:      May 14, 2021      Patient:               [name] [name], 70 y.o., female   Admit Date:        05/11/2021   Length of Stay:  3 day(s)      Problem List:      Patient Active Problem List        Diagnosis  Code         ?  Severe hypertension  I10     ?  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  PTSD (post-traumatic stress disorder)  F43.10     ?  Severe headache  R51.9     ?  Accelerated hypertension  I10     ?  Acute colitis  K52.9     ?  Acute hyperglycemia  R73.9     ?  Uncontrolled type 2 diabetes mellitus with hyperglycemia (HCC)  E11.65     ?  Spondylosis of lumbar region without myelopathy or radiculopathy  M47.816     ?  Lumbar and sacral osteoarthritis  M47.817     ?  Chronic pain syndrome  G89.4     ?  Sacroiliitis (HCC)  M46.1     ?  Type 2 diabetes mellitus with nephropathy (HCC)  E11.21     ?  Lactic acidosis  E87.20     ?  Leukocytosis  D72.829     ?  Sepsis (HCC)  A41.9     ?  Asthma exacerbation  J45.901     ?  Asthma with acute exacerbation  J45.901     ?  Gastritis  K29.70     ?  Nausea & vomiting  R11.2     ?  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  Cannabinoid hyperemesis syndrome  R11.2, F12.90     ?  Type 2 diabetes mellitus with diabetic neuropathy (HCC)  E11.40     ?  Chest pain  R07.9     ?  Dyspnea  R06.00     ?  Dehydration  E86.0     ?  COPD with acute exacerbation (HCC)  J44.1     ?  Acute-on-chronic kidney injury (HCC)  N17.9, N18.9     ?  Hyperkalemia  E87.5     ?  Obtunded  R40.1     ?  Acute renal failure (ARF) (HCC)  N17.9     ?  Septic shock (HCC)  A41.9, R65.21     ?  Metabolic encephalopathy  G93.41      ?  SIRS (systemic inflammatory response syndrome) (HCC)  R65.10     ?  Abdominal pain  R10.9     ?  Headache  R51.9     ?  Chronic respiratory failure with hypoxia (HCC)  J96.11     ?  COPD exacerbation (HCC)  J44.1     ?  Acute hypoxemic respiratory failure (HCC)  J96.01     ?  UTI (urinary tract infection)  N39.0     ?  Acute CHF (congestive heart failure) (HCC)  I50.9         ?  Respiratory failure (HCC)  J96.90             Subjective + interval history        Intubated, sedated. 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 Continue Vanc + Zosyn  - could consider stopping Vanc tomorrow   Respiratory cultures - commensal flora          DVT ppx : Therapeutic Lovenox  GI prophylaxis-pantoprazole      - Code status: full code       Recommend to continue hospitalization.   Expected date of discharge: tbd   Plan for disposition - tbd        Objective:           Visit Vitals      BP  (!) 129/94     Pulse  61     Temp  98.1 ??F (36.7 ??C)     Resp  12     Ht   (1.702 m)     Wt  95.8 kg (211 lb 3.2 oz)     SpO2  99%        BMI  33.08 kg/m??                    Intake/Output Summary  (Last 24 hours) at 05/14/2021 0981   Last data filed at 05/14/2021 0600     Gross per 24 hour        Intake  561.08 ml        Output  700 ml        Net  -138.92 ml             Physical Exam:        GEN - intubated   HEENT - mucous membranes moist   Neck - supple, no JVD   Cardiac - RRR, S1, S2, no murmurs   Chest/Lungs - expiratory wheezing   Abdomen - soft, obese, non tender   Extremities - no clubbing/ cyanosis/ edema   Neuro - sedated   Skin - no rashes  or lesions        Current medications:           Current Facility-Administered Medications:    ?  fentaNYL citrate (PF) injection 25 mcg, 25 mcg, IntraVENous, Q1H PRN, Imad, Melhem, MD   ?  dexmedeTOMidine in 0.9 % NaCl (PRECEDEX) 200 mcg/50 mL (4 mcg/mL) infusion soln, 0.1-1.5 mcg/kg/hr, IntraVENous, TITRATE, Lottie Dawson, NP, Last Rate: 7.1 mL/hr at 05/14/21 0156, 0.3 mcg/kg/hr at 05/14/21 0156   ?  hydrALAZINE (APRESOLINE) 20 mg/mL injection 20 mg, 20 mg, IntraVENous, Q6H PRN, Lottie Dawson, NP, 20 mg at 05/13/21 0865   ?  propofol (DIPRIVAN) 10 mg/mL infusion, 0-50 mcg/kg/min, IntraVENous, TITRATE, Sunshine Mackowski S, MD, Last Rate: 8.5 mL/hr at 05/14/21 0648,  15 mcg/kg/min at 05/14/21 0648   ?  vancomycin (VANCOCIN) 1250 mg in NS 250 ml infusion, 1,250 mg, IntraVENous, Q12H, Erling Conte, MD, Last Rate: 125 mL/hr at 05/14/21  0251, 1,250 mg at 05/14/21 0251   ?  enoxaparin (LOVENOX) injection 100 mg, 1 mg/kg, SubCUTAneous, Q12H, Otilio Carpen, MD, 100 mg at 05/13/21 2115   ?  pantoprazole (PROTONIX) 40 mg in 0.9% sodium chloride 10 mL injection, 40 mg, IntraVENous, DAILY, Lottie Dawson, NP, 40 mg at 05/13/21  7846   ?  dextrose (D50W) injection syrg 5-25 g, 10-50 mL, IntraVENous, PRN, Lottie Dawson, NP   ?  glucagon (GLUCAGEN) injection 1 mg, 1 mg, IntraMUSCular, PRN, Lottie Dawson, NP   ?  insulin glargine (LANTUS) injection 1-100 Units, 1-100 Units, SubCUTAneous, QHS, Lottie Dawson, NP, 16 Units at 05/13/21 2119   ?   insulin lispro (HUMALOG) injection 1-100 Units, 1-100 Units, SubCUTAneous, Q6H, Zielinski, Laura C, NP, 1 Units at 05/13/21 1225   ?  sodium chloride 3% hypertonic nebulizer soln, 4 mL, Nebulization, BID RT, Imad, Melhem, MD, 4 mL at 05/13/21 2005   ?  docusate (COLACE) 50 mg/5 mL oral liquid 100 mg, 100 mg, Oral, BID, Imad, Melhem, MD, 100 mg at 05/13/21 2114   ?  piperacillin-tazobactam (ZOSYN) 3.375 g in 0.9% sodium chloride (MBP/ADV) 100 mL MBP, 3.375 g, IntraVENous, Q8H, Erling Conte, MD, Last  Rate: 25 mL/hr at 05/14/21 0156, 3.375 g at 05/14/21 0156   ?  budesonide (PULMICORT) 500 mcg/2 ml nebulizer suspension, 500 mcg, Nebulization, BID RT, Lauralee Evener, PA-C, 500 mcg at 05/13/21 2005   ?  methylPREDNISolone (PF) (SOLU-MEDROL) injection 40 mg, 40 mg, IntraVENous, Q8H, Lauralee Evener, PA-C, 40 mg at 05/14/21 9629   ?  montelukast (SINGULAIR) tablet 10 mg, 10 mg, Oral, QHS, Lauralee Evener, PA-C, 10 mg at 05/13/21 2115   ?  dextrose (D50W) injection syrg 10-15 g, 20-30 mL, IntraVENous, PRN, Lauralee Evener, PA-C   ?  white petrolatum-mineral oiL (LACRILUBE S.O.P.) ointment 1 Each, 1 Each, Both Eyes, DAILY, Erling Conte, MD, 1 Each at 05/13/21 1105   ?  PHOSPHATE ELECTROLYTE REPLACEMENT PROTOCOL STANDARD DOSING, 1 Each, Other, PRN, Coulter, Venetia Night, NP   ?  MAGNESIUM ELECTROLYTE REPLACEMENT PROTOCOL STANDARD DOSING, 1 Each, Other, PRN, Coulter, Venetia Night, NP   ?  POTASSIUM ELECTROLYTE REPLACEMENT PROTOCOL STANDARD DOSING, 1 Each, Other, PRN, Coulter, Venetia Night, NP   ?  CALCIUM ELECTROLYTE REPLACEMENT PROTOCOL STANDARD DOSING, 1 Each, Other, PRN, Coulter, Venetia Night, NP, 1 Each at 05/13/21 1400   ?  *Vancomycin dosing by pharmacy, 1 Each, Other, Rx Dosing/Monitoring, Erling Conte, MD        Labs:  Recent Results (from the past 24 hour(s))     GLUCOSE, POC          Collection Time: 05/13/21 11:00 AM         Result  Value  Ref Range            Glucose (POC)  198 (H)  65 - 105 mg/dL       VANCOMYCIN, TROUGH           Collection Time: 05/13/21 12:10 PM         Result  Value  Ref Range            Vancomycin,trough  15.8 (H)  5.0 - 10.0 mcg/ml       GLUCOSE, POC          Collection Time: 05/13/21  4:53 PM         Result  Value  Ref Range            Glucose (POC)  179 (H)  65 - 105 mg/dL       GLUCOSE, POC          Collection Time: 05/13/21 11:23 PM         Result  Value  Ref Range            Glucose (POC)  162 (H)  65 - 105 mg/dL       METABOLIC PANEL, BASIC          Collection Time: 05/14/21  1:36 AM         Result  Value  Ref Range            Potassium  4.1  3.5 - 5.1 mEq/L       Chloride  101  98 - 107 mEq/L       Sodium  142  136 - 145 mEq/L       CO2  33 (H)  20 - 31 mEq/L       Glucose  160 (H)  74 - 106 mg/dl       BUN  21  9 - 23 mg/dl       Creatinine  1.61  0.55 - 1.02 mg/dl       GFR est AA  >09.6          GFR est non-AA  >60          Calcium  9.4  8.7 - 10.4 mg/dl       Anion gap  8  5 - 15 mmol/L       CBC WITH AUTOMATED DIFF          Collection Time: 05/14/21  1:36 AM         Result  Value  Ref Range            WBC  13.8 (H)  4.0 - 11.0 1000/mm3       RBC  4.22  3.60 - 5.20 M/uL       HGB  11.4 (L)  13.0 - 17.2 gm/dl       HCT  04.5 (L)  40.9 - 50.0 %       MCV  86.7  80.0 - 98.0 fL       MCH  27.0  25.4 - 34.6 pg       MCHC  31.1  30.0 - 36.0 gm/dl       PLATELET  811  914 - 450 1000/mm3       MPV  12.0 (H)  6.0 - 10.0 fL       RDW-SD  48.2 (H)  36.4 - 46.3         NRBC  0  0 - 0         IMMATURE GRANULOCYTES  0.7  0.0 - 3.0 %       NEUTROPHILS  88.3 (H)  34 - 64 %       LYMPHOCYTES  4.5 (L)  28 - 48 %       MONOCYTES  6.4  1 - 13 %       EOSINOPHILS  0.0  0 - 5 %       BASOPHILS  0.1  0 - 3 %       TRIGLYCERIDE          Collection Time: 05/14/21  1:36 AM         Result  Value  Ref Range            Triglyceride  131  0 - 150 mg/dl       GLUCOSE, POC          Collection Time: 05/14/21  6:14 AM         Result  Value  Ref Range            Glucose (POC)  176 (H)  65 - 105 mg/dL           XR Results:   Results  from Hospital Encounter encounter on 05/11/21      XR CHEST SNGL V      Narrative   Exam: AP portable chest      Clinical indication: respiratory failure - ICU patient      Comparison: 05/11/2021;      Results:  Stable wedge-shaped infiltrate left mid chest. No change left lower   lobe lung nodule 18 mm. Large bulla in the left upper lobe.   No pneumothorax.   Endotracheal tube and nasogastric tube in customary position. The distal extent   of nasogastric tube not included on the film and cannot be evaluated.      Heart normal.  Mediastinal contours are normal.      No free air is seen under the hemidiaphragms.   Osseous structures intact.      Impression   IMPRESSION:   1. No change in the triangular infiltrates left mid chest and left lung nodule.   2. Stable support tubes. Distal extent nasogastric tube cannot be evaluated. Not   on the film.   3. Large left upper lobe bulla.      Electronically signed by: Earline Mayotte, MD 05/14/2021 5:03 AM EST         CT Results:   Results from Hospital Encounter encounter on 05/11/21      CT CHEST W CON PE PROTOCOL      Narrative   All CT exams at this facility use one or more dose reduction techniques   including automatic exposure control, mA/kV adjustment per patient's size, or   iterative reconstruction technique.      Clinical History: rule out PE.      Examination:   CTA CHEST with contrast. 3 mm spiral scanning was performed from the lung apices   to the upper pole of the kidneys. 3D MIP coronal and sagittal reconstruction   imaging has been performed.      Correlation:   08/30/2018, 02/18/2018 and 07/03/2015      Findings:  Degenerative changes of the spine. Secretions dependent portions of the trachea.   Dense airspace opacity with air bronchograms left upper lobe. There are   bilateral bullae. Dependent atelectasis lung bases.      2 cm left lower lobe nodule slightly increased in size from 08/30/2018, 02/18/2018   and 07/02/2025 ((prior measurement 1.7, 1.9 and 1.3  cm). No dissection thoracic   aorta. Ascending aorta measures 4.7 cm unchanged from prior studies. The   esophagus is not dilated.      No lymph node enlargement in the axilla, mediastinum or hila. No pulmonary   embolism. Visualized portions of the liver, pancreas, adrenal glands are   unremarkable. 2.3 cm splenic lesion unchanged from 02/18/2018. 1.8 cm midpole   right renal hypodensity, ultrasound characterization suggested.      Impression   IMPRESSION:   1. No pulmonary embolism.   2. Bilateral bullae   3. Left upper lobe pneumonia/volume loss. Radiographic follow-up to resolution   suggested.   4. 2 cm left lower lobe nodule not significantly changed from prior studies.   5. 2.3 cm stable splenic lesion, unchanged from 02/18/2018.   6. 1.8 cm probable cyst midpole right kidney, ultrasound characterization   suggested.      Electronically signed by: Jolayne Panther, MD 05/11/2021 10:11 PM EST         MRI Results:   Results from Orders Only encounter on 10/17/19      MRI LUMB SPINE W CONT         Nuclear Medicine Results:   No results found for this or any previous visit.         [place] Results:   Results from Hospital Encounter encounter on 12/24/17      [place] RUQ      Narrative   Examination: Ultrasound right upper quadrant.      INDICATION: Pain.      FINDINGS:      The liver appears grossly unremarkable. No focal hepatic lesions definitely   identified.      The gallbladder appears unremarkable. No gallbladder wall thickening. No   sonographic Murphy's sign. CBD within normal limits roughly measuring 0.3 cm.      Right kidney appears unremarkable and is normal in size. A 4 mm nonobstructing   renal calculus. Tiny cyst on previous CT dated 08/27/2016, not well visualized on   the current exam.      Proximal aorta and IVC appear unremarkable.      Impression   IMPRESSION:   1. No acute abnormalities.   2. No gallbladder wall thickening, gallstones or evidence of acute   cholecystitis.   3. Nonobstructing right renal  calculus.         IR Results:   No results found for this or any previous visit.         VAS/US Results:   Results from Hospital Encounter encounter on 05/11/21      DUPLEX UPPER EXT VENOUS LEFT      Narrative   [id number]   [id number]   [id number]   Study ID: [id number]      [facility]   [address] [place],   [place]   [address]      Upper Extremity Venous Duplex Report      Name: ARLEN, LEGENDRE Date: 05/12/2021 08:42 AM   MRN: [id number]  Patient Location: [id number]   DOB: Apr 09, 1951               Age: 71 yrs   Gender: Female                Account #: [id number]   Reason For Study: History of DVT. ICD-10 Z86.71   Ordering Physician: Otilio Carpen   Referring Physician: [name]   Performed By: Kathrin Ruddy, RVT      Interpretation Summary   1. No evidence of deep venous thrombosis in the left upper extremity.   2. No evidence of deep venous thrombosis in the contralateral/ right   internal jugular and subclavian veins.   3. Compared to previous exam done on 01/24/2021, the previously documented   subclavian deep vein thrombosis has now resolved.   ___________________________________________________________________________   RIGHT ARM   The right internal jugular and subclavian veins were patent and   compressible, no evidence of intraluminal thrombus. Spectral Doppler exam   demonstrates normal venous flow in the right jugular vein. Spectral   Doppler exam demonstrates normal venous flow in the right subclavian vein.      LEFT ARM   The left upper extremity veins were examined with duplex ultrasound. The   internal jugular, subclavian, axillary and brachial veins were patent and   compressible with no intraluminal thrombus identified. Spontaneous,   pulsatile flow with normal augmentation was noted throughout the left   upper extremity.      LEFT SUPERFICIAL VEINS   The cephalic and basilic veins were patent and compressible  with no   evidence of intraluminal thrombus identified.      SONOGRAPHERS COMMENTS   Technically difficult and suboptimal due to positioning and patients   inability to cooperate.      Electronically signed byDr. Charlane Ferretti, M.D.   05/12/2021 04:30 PM            Total clinical care time was 37  minutes of which more than 50% was spent in coordination of care and counseling (time spent with patient/family face to face, physical exam, reviewing laboratory and imaging investigations, speaking with physicians and  nursing staff involved in this patient's care).       Dragon medical dictation system was used for part of this note. Unintentional voice recognition errors may occur      Tia Masker,  MD   [facility]   May 14, 2021   Time: 7:52 AM

## 2021-05-14 NOTE — Progress Notes (Signed)
 Daily Ventilator Progress Note      Name:  NARIYAH OSIAS  Gender:  F    DOB:  August 21, 1950 Age:  70 y.o.    MRN:  [id number]  Admit Date:  05/11/2021      Primary Encounter Diagnosis:       The primary encounter diagnosis was Acute respiratory failure with hypoxia and hypercapnia (HCC). Diagnoses of Leukocytosis, unspecified type, Acute hyperkalemia, and Hypercarbia were also pertinent to this visit.      Active Problem List:     Patient Active Problem List    Diagnosis    Respiratory failure (HCC)    Acute CHF (congestive heart failure) (HCC)    UTI (urinary tract infection)    COPD exacerbation (HCC)    Acute hypoxemic respiratory failure (HCC)    Chronic respiratory failure with hypoxia (HCC)     On 3L O2 chronically.      Headache    Abdominal pain    SIRS (systemic inflammatory response syndrome) (HCC)    Hyperkalemia    Obtunded    Acute renal failure (ARF) (HCC)    Septic shock (HCC)    Metabolic encephalopathy    Dehydration    COPD with acute exacerbation (HCC)    Acute-on-chronic kidney injury (HCC)    Chest pain    Dyspnea    Type 2 diabetes mellitus with diabetic neuropathy (HCC)    Narcotic bowel syndrome (HCC)    Cannabinoid hyperemesis syndrome    Gastritis    Nausea & vomiting    Asthma with acute exacerbation    Lactic acidosis    Leukocytosis    Sepsis (HCC)    Asthma exacerbation    Type 2 diabetes mellitus with nephropathy (HCC)    Sacroiliitis (HCC)    Spondylosis of lumbar region without myelopathy or radiculopathy    Lumbar and sacral osteoarthritis    Chronic pain syndrome    Uncontrolled type 2 diabetes mellitus with hyperglycemia (HCC)    Acute colitis    Acute hyperglycemia    Accelerated hypertension    Severe headache    Osteoarthritis of hips, bilateral    PTSD (post-traumatic stress disorder)    Severe hypertension             LOS: 3 days     ICU LOS:  2d 5h     Vital signs in last 12 hours:   Patient Vitals for the past 12 hrs:   Temp Pulse Resp BP SpO2   05/14/21 0600 -- 61 12 (!) 129/94 99  %   05/14/21 0509 -- 96 12 -- 100 %   05/14/21 0500 -- (!) 59 17 (!) 133/102 99 %   05/14/21 0404 98.1 F (36.7 C) -- -- -- --   05/14/21 0400 -- 61 12 (!) 89/74 100 %   05/14/21 0300 -- 89 19 (!) 146/117 100 %   05/14/21 0200 -- 63 23 (!) 144/96 100 %   05/14/21 0100 -- (!) 55 24 (!) 160/100 100 %   05/14/21 0036 -- (!) 58 12 -- 100 %   05/14/21 0000 -- (!) 56 15 (!) 160/105 99 %   05/13/21 2319 97 F (36.1 C) -- -- -- --   05/13/21 2300 -- (!) 56 24 (!) 162/101 100 %   05/13/21 2200 -- (!) 57 24 (!) 161/106 100 %   05/13/21 2100 -- (!) 58 24 (!) 151/98 100 %   05/13/21 2008 -- -- -- -- 100 %  05/13/21 2000 97 F (36.1 C) 61 24 (!) 155/103 100 %   05/13/21 1955 -- 61 12 -- 100 %   05/13/21 1933 97.7 F (36.5 C) -- -- -- --   05/13/21 1900 -- 68 18 -- 100 %          ABG Results (Last 7):     Recent Labs     05/13/21  0547 05/12/21  1657 05/12/21  0516 05/12/21  0104 05/11/21  2155 05/11/21  2021 05/11/21  1839   PH 7.481* 7.470* 7.407 7.282* 7.192* 7.201* 7.213*   PCO2 51.6* 54.4* 64.4* 95.3* 114.2* 114.3* 113.5*   PO2 89.0 51.0* 66.0* 169.0* 70.0* 59.0* 100.0   HCO3 38.5* 39.5* 40.5* 45.2* 44.1* 44.9* 45.7*   BE 15* 16* 16* 18* 16* 17* 18*   O2ST 97.0 86.0* 92.0 99.0 87.0* 81.0* 95.0   SITE R Radial R Radial L Radial L Radial L Radial L Radial R Radial   SAMPLE Art Art Art Art Art Art Art   FIO2 60 40 60 100 50 40 40   HDEV Vent Vent Vent Vent BiPAP BiPAP BiPAP            ARDS is classified according to the degree of hypoxemia (PaO2/FiO2 ratio),  mild (PaO2/FiO2, 201-300), moderate (PaO2/FiO2, 101-200), and severe (PaO2/FiO2???100)       Airway:     Airway - Endotracheal Tube 05/11/21 Oral (Active)   Placement Date/Time: 05/11/21 2315   Number of Attempts: 1  Inserted By: Jeannette Cough, MD  Present on Admission/Arrival: Yes  Location: Oral  Placement Verified: Auscultation;BBS;Chest x-ray;EtCO2  Airway Types: Endotracheal, cuffed  Airway Tube Si...   Number of days: 2       Airway - Endotracheal Tube  05/11/21 Oral (Active)   Insertion Depth (cm) 25 cm 05/14/21 0509   Line Mark Lips 05/14/21 0509   Side Secured Centered 05/14/21 0509   Cuff Pressure 26 cmH20 05/14/21 0509   Site Assessment Clean, dry, & intact 05/14/21 0509         Ventilator Settings:   Ventilator Mode: Volume control  FIO2 (%): 30 %, SpO2/FIO2 Ratio: 333.33, CMV Rate Set: 12, Vt Set (ml): 480 ml, PEEP/VENT (cm H2O): 5 cm H20, I:E Ratio: 1:4.6, Insp Time (sec): 0.9 sec, Insp Flow (l/min): 48 l/min, Insp Rise Time %: 20 %, Flow Trigger: 3.0, Exp Time (sec): 4.1 sec     Vt Set (ml): 480 ml Vt Exhaled (Machine Breath) (ml): 488 ml       Ventilator Pressures PIP Observed (cm H2O): 34 cm H2O, Plateau Pressure (cm H2O): 28.1 cm H2O, MAP (cm H2O): 12, PEEP/VENT (cm H2O): 5 cm H20         Safety & Alarms Circuit Temperature: 98.6 F (37 C), Pressure Max: 50 cm H2O, Pressure Min: 10 cm H2O, Ve Min: 2, Ve Max: 20, Vt Min: 150 ml, Vt Max: 1000 ml, RR Min: 5, RR Max: 40       Breath Sounds:   Breath Sounds Bilateral: Diminished     Secretions:     Suction: ET Tube  Suction Device: Inline suction catheter  Sputum Method Obtained: Endotracheal  Sputum Amount: Small  Sputum Color/Odor: White  Sputum Consistency: Thick     Chest X-Ray:     CXR Results  (Last 48 hours)                 05/14/21 0425  XR CHEST SNGL V Final result  Impression:  IMPRESSION:   1. No change in the triangular infiltrates left mid chest and left lung nodule.   2. Stable support tubes. Distal extent nasogastric tube cannot be evaluated. Not   on the film.   3. Large left upper lobe bulla.       Electronically signed by: Celine Rear, MD 05/14/2021 5:03 AM EST           Narrative:  Exam: AP portable chest       Clinical indication: respiratory failure - ICU patient       Comparison: 05/11/2021;         Results:  Stable wedge-shaped infiltrate left mid chest. No change left lower   lobe lung nodule 18 mm. Large bulla in the left upper lobe.   No pneumothorax.   Endotracheal tube and  nasogastric tube in customary position. The distal extent   of nasogastric tube not included on the film and cannot be evaluated.       Heart normal.  Mediastinal contours are normal.        No free air is seen under the hemidiaphragms.   Osseous structures intact.                    XR Results (most recent):  Results from Hospital Encounter encounter on 05/11/21    XR CHEST SNGL V    Narrative  Exam: AP portable chest    Clinical indication: respiratory failure - ICU patient    Comparison: 05/11/2021;    Results:  Stable wedge-shaped infiltrate left mid chest. No change left lower  lobe lung nodule 18 mm. Large bulla in the left upper lobe.  No pneumothorax.  Endotracheal tube and nasogastric tube in customary position. The distal extent  of nasogastric tube not included on the film and cannot be evaluated.    Heart normal.  Mediastinal contours are normal.    No free air is seen under the hemidiaphragms.   Osseous structures intact.    Impression  IMPRESSION:  1. No change in the triangular infiltrates left mid chest and left lung nodule.  2. Stable support tubes. Distal extent nasogastric tube cannot be evaluated. Not  on the film.  3. Large left upper lobe bulla.    Electronically signed by: Celine Rear, MD 05/14/2021 5:03 AM EST        Weaning Trial:   Spontaneous Breathing Trial Complete: Yes (SBT PS=10 PEEP=5 O2=30%) (05/13/21 0751)  RASS  [place] Agitation Sedation Scale (RASS): Alert and calm      Weaning Parameters:    Resp Rate Observed: 19 (05/13/21 0751)   Ve: 5.8  VT: 281   RSBI: 76  NIF: -38  PEF: 20   VC: 473       Summary:   Patient's vT increased from 450 to  480 due to complaint of shortness of breath.       Plan:     Continue to monitor patient on (S)CMV on the venilator    Electronically Signed By: Jeral ONEIDA Grad, RT     May 14, 2021
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
## 2021-05-14 NOTE — Progress Notes (Signed)
 Daily Ventilator Progress Note      Name:  [name] [name]  Gender:  F    DOB:  31-May-1951 Age:  70 y.o.    MRN:  [id number]  Admit Date:  05/11/2021      Primary Encounter Diagnosis:       The primary encounter diagnosis was Acute respiratory failure with hypoxia and hypercapnia (HCC). Diagnoses of Leukocytosis, unspecified type, Acute hyperkalemia, and Hypercarbia were also pertinent to this visit.      Active Problem List:     Patient Active Problem List    Diagnosis    Respiratory failure (HCC)    Acute CHF (congestive heart failure) (HCC)    UTI (urinary tract infection)    COPD exacerbation (HCC)    Acute hypoxemic respiratory failure (HCC)    Chronic respiratory failure with hypoxia (HCC)     On 3L O2 chronically.      Headache    Abdominal pain    SIRS (systemic inflammatory response syndrome) (HCC)    Hyperkalemia    Obtunded    Acute renal failure (ARF) (HCC)    Septic shock (HCC)    Metabolic encephalopathy    Dehydration    COPD with acute exacerbation (HCC)    Acute-on-chronic kidney injury (HCC)    Chest pain    Dyspnea    Type 2 diabetes mellitus with diabetic neuropathy (HCC)    Narcotic bowel syndrome (HCC)    Cannabinoid hyperemesis syndrome    Gastritis    Nausea & vomiting    Asthma with acute exacerbation    Lactic acidosis    Leukocytosis    Sepsis (HCC)    Asthma exacerbation    Type 2 diabetes mellitus with nephropathy (HCC)    Sacroiliitis (HCC)    Spondylosis of lumbar region without myelopathy or radiculopathy    Lumbar and sacral osteoarthritis    Chronic pain syndrome    Uncontrolled type 2 diabetes mellitus with hyperglycemia (HCC)    Acute colitis    Acute hyperglycemia    Accelerated hypertension    Severe headache    Osteoarthritis of hips, bilateral    PTSD (post-traumatic stress disorder)    Severe hypertension             LOS: 3 days     ICU LOS:  2d 15h     Vital signs in last 12 hours:   Patient Vitals for the past 12 hrs:   Temp Pulse Resp BP SpO2   05/14/21 1625 -- (!) 57 14 -- 98 %    05/14/21 1540 98.2 F (36.8 C) -- -- -- --   05/14/21 1228 -- 91 14 -- 96 %   05/14/21 0850 -- (!) 125 24 -- 100 %   05/14/21 0846 -- -- -- -- 99 %   05/14/21 0807 98 F (36.7 C) -- -- -- --   05/14/21 0803 -- 89 -- (!) 171/116 --   05/14/21 0600 -- 61 12 (!) 129/94 99 %   05/14/21 0509 -- 96 12 -- 100 %   05/14/21 0500 -- (!) 59 17 (!) 133/102 99 %          ABG Results (Last 7):     Recent Labs     05/14/21  1449 05/14/21  1257 05/13/21  0547 05/12/21  1657 05/12/21  0516 05/12/21  0104 05/11/21  2155   PH 7.569* 7.477* 7.481* 7.470* 7.407 7.282* 7.192*   PCO2 40.6 50.6* 51.6* 54.4* 64.4*  95.3* 114.2*   PO2 61.0* 62.0* 89.0 51.0* 66.0* 169.0* 70.0*   HCO3 37.1* 37.4* 38.5* 39.5* 40.5* 45.2* 44.1*   BE 15* 14* 15* 16* 16* 18* 16*   O2ST 94.0 92.0 97.0 86.0* 92.0 99.0 87.0*   SITE L Radial L Radial R Radial R Radial L Radial L Radial L Radial   SAMPLE Art Art Art Art Art Art Art   FIO2 35 30 60 40 60 100 50   HDEV Vent Vent Vent Vent Vent Vent BiPAP            ARDS is classified according to the degree of hypoxemia (PaO2/FiO2 ratio),  mild (PaO2/FiO2, 201-300), moderate (PaO2/FiO2, 101-200), and severe (PaO2/FiO2???100)       Airway:     Airway - Endotracheal Tube 05/11/21 Oral (Active)   Placement Date/Time: 05/11/21 2315   Number of Attempts: 1  Inserted By: Jeannette Cough, MD  Present on Admission/Arrival: Yes  Location: Oral  Placement Verified: Auscultation;BBS;Chest x-ray;EtCO2  Airway Types: Endotracheal, cuffed  Airway Tube Si...   Number of days: 2       Airway - Endotracheal Tube 05/11/21 Oral (Active)   Insertion Depth (cm) 25 cm 05/14/21 1625   Line Mark Lips 05/14/21 1625   Side Secured Device;Left 05/14/21 1625   Cuff Pressure 28 cmH20 05/14/21 1625   Site Assessment Clean, dry, & intact 05/14/21 1625         Ventilator Settings:   Ventilator Mode: Other (comment)  FIO2 (%): 35 % (found on), SpO2/FIO2 Ratio: 280, CMV Rate Set: 14, Back-Up Rate: 14, Vt Set (ml): 400 ml (Target), PC Set: 22 (titrated  due to low VT), PEEP/VENT (cm H2O): 5 cm H20, I:E Ratio: 1:5.2, Insp Time (sec): 0.69 sec, Insp Flow (l/min): 52.1 l/min, Insp Rise Time %: 16 %, Flow Trigger: 3, Exp Time (sec): 3.6 sec, Exp Flow (l/min): 45.4 l/sec     Vt Set (ml): 400 ml (Target) Vt Exhaled (Machine Breath) (ml): 404 ml       Ventilator Pressures PC Set: 22 (titrated due to low VT), PIP Observed (cm H2O): 35 cm H2O, Plateau Pressure (cm H2O): 15 cm H2O, MAP (cm H2O): 9.6, PEEP/VENT (cm H2O): 5 cm H20, Auto PEEP Observed (cm H2O): 0 cm H2O         Safety & Alarms Circuit Temperature: 98.4 F (36.9 C), Backup Mode Checked/Apnea: Yes, Pressure Max: 50 cm H2O, Pressure Min: 10 cm H2O, Ve Min: 2, Ve Max: 20, Vt Min: 150 ml, Vt Max: 1000 ml, RR Min: 8, RR Max: 40       Breath Sounds:   Breath Sounds Bilateral: Diminished     Secretions:     Suction: ET Tube  Suction Device: Inline suction catheter  Sputum Method Obtained: Endotracheal  Sputum Amount: Moderate  Sputum Color/Odor: Waymond Collin tinged  Sputum Consistency: Thick     Chest X-Ray:     CXR Results  (Last 48 hours)                 05/14/21 0425  XR CHEST SNGL V Final result    Impression:  IMPRESSION:   1. No change in the triangular infiltrates left mid chest and left lung nodule.   2. Stable support tubes. Distal extent nasogastric tube cannot be evaluated. Not   on the film.   3. Large left upper lobe bulla.       Electronically signed by: Celine Rear, MD 05/14/2021 5:03 AM EST  Narrative:  Exam: AP portable chest       Clinical indication: respiratory failure - ICU patient       Comparison: 05/11/2021;         Results:  Stable wedge-shaped infiltrate left mid chest. No change left lower   lobe lung nodule 18 mm. Large bulla in the left upper lobe.   No pneumothorax.   Endotracheal tube and nasogastric tube in customary position. The distal extent   of nasogastric tube not included on the film and cannot be evaluated.       Heart normal.  Mediastinal contours are normal.         No free air is seen under the hemidiaphragms.   Osseous structures intact.                    XR Results (most recent):  Results from Hospital Encounter encounter on 05/11/21    XR CHEST SNGL V    Narrative  Exam: AP portable chest    Clinical indication: respiratory failure - ICU patient    Comparison: 05/11/2021;    Results:  Stable wedge-shaped infiltrate left mid chest. No change left lower  lobe lung nodule 18 mm. Large bulla in the left upper lobe.  No pneumothorax.  Endotracheal tube and nasogastric tube in customary position. The distal extent  of nasogastric tube not included on the film and cannot be evaluated.    Heart normal.  Mediastinal contours are normal.    No free air is seen under the hemidiaphragms.   Osseous structures intact.    Impression  IMPRESSION:  1. No change in the triangular infiltrates left mid chest and left lung nodule.  2. Stable support tubes. Distal extent nasogastric tube cannot be evaluated. Not  on the film.  3. Large left upper lobe bulla.    Electronically signed by: Celine Rear, MD 05/14/2021 5:03 AM EST        Weaning Trial:   Spontaneous Breathing Trial Complete: No (Comments) (05/14/21 1625)  RASS   Agitation Sedation Scale (RASS): Moderate sedation      Weaning Parameters:    Resp Rate Observed: 19 (05/13/21 0751)   Ve: 5.8  VT: 281   RSBI: 76  NIF: -38  PEF: 20   VC: 473       Summary:   Do not Suction pass ET Tube due bleeding per Dr Johney, CHRISTELLA. Vent settings changed to APVcmv.          Plan:     Continue to monitor patient on APVcmv on the venilator.    Electronically Signed By: Mardeen Clay, RT     May 14, 2021
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
## 2021-05-15 LAB — POC BLOOD GAS + LACTIC ACID
BASE EXCESS: 5 mmol/L — ABNORMAL HIGH (ref <=3)
BICARBONATE: 30.9 mmol/L — CR (ref 18.0–26.0)
Base Excess: 5 mmol/L — ABNORMAL HIGH (ref <=3)
CO2 Total: 33 mmol/L — ABNORMAL HIGH (ref 24–29)
CO2, TOTAL: 33 mmol/L — ABNORMAL HIGH (ref 24–29)
FIO2: 35
FIO2: 35
HCO3: 30.9 mmol/L (ref 18.0–26.0)
LACTIC ACID: 1.08 mmol/L (ref 0.40–2.00)
Lactic Acid: 1.08 mmol/L (ref 0.40–2.00)
O2 SAT: 97 % (ref 90–100)
O2 Sat: 97 % (ref 90–100)
PCO2: 55 mm Hg — ABNORMAL HIGH (ref 35.0–45.0)
PCO2: 55 mm Hg — ABNORMAL HIGH (ref 35.0–45.0)
PEEP/CPAP: 5
PO2: 93 mm Hg (ref 75–100)
PO2: 93 mm Hg (ref 75–100)
PRESSURE SUPPORT: 13
Peep/Cpap: 5
Pressure Support: 13
RESPIRATORY RATE: 14
Respiratory Rate: 14
VTEXP, VTEXP: 517
VTEXP: 517
pH: 7.358 (ref 7.350–7.450)
pH: 7.358 (ref 7.350–7.450)

## 2021-05-15 LAB — BASIC METABOLIC PANEL
Anion Gap: 10 mmol/L (ref 5–15)
BUN: 24 mg/dl — ABNORMAL HIGH (ref 9–23)
CO2: 29 mEq/L (ref 20–31)
Calcium: 8.9 mg/dl (ref 8.7–10.4)
Chloride: 103 mEq/L (ref 98–107)
Creatinine: 0.73 mg/dl (ref 0.55–1.02)
EGFR IF NonAfrican American: >60
GFR African American: >60
Glucose: 209 mg/dl — ABNORMAL HIGH (ref 74–106)
Potassium: 3.5 mEq/L (ref 3.5–5.1)
Sodium: 142 mEq/L (ref 136–145)

## 2021-05-15 LAB — CBC WITH AUTO DIFFERENTIAL
Basophils %: 0.1 % (ref 0–3)
Eosinophils %: 0 % (ref 0–5)
Hematocrit: 34.4 % — ABNORMAL LOW (ref 37.0–50.0)
Hemoglobin: 10.4 gm/dl — ABNORMAL LOW (ref 13.0–17.2)
Immature Granulocytes: 0.4 % (ref 0.0–3.0)
Lymphocytes %: 6.7 % — ABNORMAL LOW (ref 28–48)
MCH: 26.8 pg (ref 25.4–34.6)
MCHC: 30.2 gm/dl (ref 30.0–36.0)
MCV: 88.7 fL (ref 80.0–98.0)
MPV: 12.8 fL — ABNORMAL HIGH (ref 6.0–10.0)
Monocytes %: 10.5 % (ref 1–13)
Neutrophils %: 82.3 % — ABNORMAL HIGH (ref 34–64)
Nucleated RBCs: 0 (ref 0–0)
Platelets: 162 10*3/uL (ref 140–450)
RBC: 3.88 M/uL (ref 3.60–5.20)
RDW-SD: 50.5 — ABNORMAL HIGH (ref 36.4–46.3)
WBC: 9.7 10*3/uL (ref 4.0–11.0)

## 2021-05-15 LAB — CULTURE, RESPIRATORY/SPUTUM/BRONCH W GRAM STAIN

## 2021-05-15 LAB — POCT GLUCOSE
POC Glucose: 220 mg/dL — ABNORMAL HIGH (ref 65–105)
POC Glucose: 218 mg/dL — ABNORMAL HIGH (ref 65–105)
POC Glucose: 234 mg/dL — ABNORMAL HIGH (ref 65–105)
POC Glucose: 212 mg/dL — ABNORMAL HIGH (ref 65–105)

## 2021-05-15 LAB — POTASSIUM
Potassium: 4.2 mEq/L (ref 3.5–5.1)
Potassium: 4.2 mEq/L (ref 3.5–5.1)

## 2021-05-15 LAB — CBC WITH AUTOMATED DIFF
BASOPHILS: 0.1 % (ref 0–3)
EOSINOPHILS: 0 % (ref 0–5)
HCT: 34.4 % — ABNORMAL LOW (ref 37.0–50.0)
HGB: 10.4 gm/dl — ABNORMAL LOW (ref 13.0–17.2)
IMMATURE GRANULOCYTES: 0.4 % (ref 0.0–3.0)
LYMPHOCYTES: 6.7 % — ABNORMAL LOW (ref 28–48)
MCH: 26.8 pg (ref 25.4–34.6)
MCHC: 30.2 gm/dl (ref 30.0–36.0)
MCV: 88.7 fL (ref 80.0–98.0)
MONOCYTES: 10.5 % (ref 1–13)
MPV: 12.8 fL — ABNORMAL HIGH (ref 6.0–10.0)
NEUTROPHILS: 82.3 % — ABNORMAL HIGH (ref 34–64)
NRBC: 0 (ref 0–0)
PLATELET: 162 10*3/uL (ref 140–450)
RBC: 3.88 M/uL (ref 3.60–5.20)
RDW-SD: 50.5 — ABNORMAL HIGH (ref 36.4–46.3)
WBC: 9.7 10*3/uL (ref 4.0–11.0)

## 2021-05-15 LAB — METABOLIC PANEL, BASIC
Anion gap: 10 mmol/L (ref 5–15)
BUN: 24 mg/dl — ABNORMAL HIGH (ref 9–23)
CO2: 29 mEq/L (ref 20–31)
Calcium: 8.9 mg/dl (ref 8.7–10.4)
Chloride: 103 mEq/L (ref 98–107)
Creatinine: 0.73 mg/dl (ref 0.55–1.02)
GFR est AA: >60
GFR est non-AA: >60
Glucose: 209 mg/dl — ABNORMAL HIGH (ref 74–106)
Potassium: 3.5 mEq/L (ref 3.5–5.1)
Sodium: 142 mEq/L (ref 136–145)

## 2021-05-15 LAB — GLUCOSE, POC
Glucose (POC): 220 mg/dL — ABNORMAL HIGH (ref 65–105)
Glucose (POC): 218 mg/dL — ABNORMAL HIGH (ref 65–105)
Glucose (POC): 234 mg/dL — ABNORMAL HIGH (ref 65–105)
Glucose (POC): 212 mg/dL — ABNORMAL HIGH (ref 65–105)

## 2021-05-15 MED ORDER — POTASSIUM CHLORIDE 10 % ORAL LIQUID
20 mEq/15 mL | Freq: Once | ORAL | Status: AC
Start: 2021-05-15 — End: 2021-05-15
  Administered 2021-05-15: 13:00:00 via GASTROSTOMY

## 2021-05-15 MED FILL — SOLU-MEDROL (PF) 40 MG/ML SOLUTION FOR INJECTION: 40 mg/mL | INTRAMUSCULAR | Qty: 1

## 2021-05-15 MED FILL — REFRESH LACRI-LUBE 56.8 %-42.5 % EYE OINTMENT: OPHTHALMIC | Qty: 3.5

## 2021-05-15 MED FILL — IPRATROPIUM-ALBUTEROL 2.5 MG-0.5 MG/3 ML NEB SOLUTION: 2.5 mg-0.5 mg/3 ml | RESPIRATORY_TRACT | Qty: 3

## 2021-05-15 MED FILL — DEXMEDETOMIDINE 200 MCG/50 ML (4 MCG/ML) IN 0.9 % SODIUM CHLORIDE IV: 200 mcg/50 mL (4 mcg/mL) | INTRAVENOUS | Qty: 50

## 2021-05-15 MED FILL — POTASSIUM CHLORIDE 10 % ORAL LIQUID: 20 mEq/15 mL | ORAL | Qty: 30

## 2021-05-15 MED FILL — VANCOMYCIN IN 0.9 % SODIUM CHLORIDE 1.25 GRAM/250 ML IV: 1.25 gram/250 mL | INTRAVENOUS | Qty: 250

## 2021-05-15 MED FILL — DIPRIVAN 10 MG/ML INTRAVENOUS EMULSION: 10 mg/mL | INTRAVENOUS | Qty: 100

## 2021-05-15 MED FILL — PIPERACILLIN-TAZOBACTAM 3.375 GRAM IV SOLR: 3.375 gram | INTRAVENOUS | Qty: 3.38

## 2021-05-15 MED FILL — HEPARIN (PORCINE) 5,000 UNIT/ML IJ SOLN: 5000 unit/mL | INTRAMUSCULAR | Qty: 1

## 2021-05-15 MED FILL — FENTANYL CITRATE (PF) 50 MCG/ML IJ SOLN: 50 mcg/mL | INTRAMUSCULAR | Qty: 2

## 2021-05-15 MED FILL — BUDESONIDE 0.5 MG/2 ML NEB SUSPENSION: 0.5 mg/2 mL | RESPIRATORY_TRACT | Qty: 1

## 2021-05-15 MED FILL — SODIUM CHLORIDE 3 % NEB SOLUTION: 3 % | RESPIRATORY_TRACT | Qty: 15

## 2021-05-15 MED FILL — DOCUSATE SODIUM 50 MG/5 ML ORAL LIQUID: 50 mg/5 mL | ORAL | Qty: 10

## 2021-05-15 MED FILL — ACETAZOLAMIDE 500 MG SOLUTION FOR INJECTION: 500 mg | INTRAMUSCULAR | Qty: 500

## 2021-05-15 MED FILL — ADMELOG U-100 INSULIN LISPRO 100 UNIT/ML SUBCUTANEOUS SOLUTION: 100 unit/mL | SUBCUTANEOUS | Qty: 1

## 2021-05-15 MED FILL — SODIUM CHLORIDE 3 % NEB SOLUTION: 3 % | RESPIRATORY_TRACT | Qty: 60

## 2021-05-15 MED FILL — MONTELUKAST 10 MG TAB: 10 mg | ORAL | Qty: 1

## 2021-05-15 MED FILL — LANTUS U-100 INSULIN 100 UNIT/ML SUBCUTANEOUS SOLUTION: 100 unit/mL | SUBCUTANEOUS | Qty: 1

## 2021-05-15 MED FILL — PANTOPRAZOLE 40 MG IV SOLR: 40 mg | INTRAVENOUS | Qty: 40

## 2021-05-15 NOTE — Progress Notes (Signed)
 Daily Ventilator Progress Note      Name:  [name] [name]  Gender:  F    DOB:  17-Nov-1950 Age:  70 y.o.    MRN:  [id number]  Admit Date:  05/11/2021      Primary Encounter Diagnosis:       The primary encounter diagnosis was Acute respiratory failure with hypoxia and hypercapnia (HCC). Diagnoses of Leukocytosis, unspecified type, Acute hyperkalemia, and Hypercarbia were also pertinent to this visit.      Active Problem List:     Patient Active Problem List    Diagnosis    Respiratory failure (HCC)    Acute CHF (congestive heart failure) (HCC)    UTI (urinary tract infection)    COPD exacerbation (HCC)    Acute hypoxemic respiratory failure (HCC)    Chronic respiratory failure with hypoxia (HCC)     On 3L O2 chronically.      Headache    Abdominal pain    SIRS (systemic inflammatory response syndrome) (HCC)    Hyperkalemia    Obtunded    Acute renal failure (ARF) (HCC)    Septic shock (HCC)    Metabolic encephalopathy    Dehydration    COPD with acute exacerbation (HCC)    Acute-on-chronic kidney injury (HCC)    Chest pain    Dyspnea    Type 2 diabetes mellitus with diabetic neuropathy (HCC)    Narcotic bowel syndrome (HCC)    Cannabinoid hyperemesis syndrome    Gastritis    Nausea & vomiting    Asthma with acute exacerbation    Lactic acidosis    Leukocytosis    Sepsis (HCC)    Asthma exacerbation    Type 2 diabetes mellitus with nephropathy (HCC)    Sacroiliitis (HCC)    Spondylosis of lumbar region without myelopathy or radiculopathy    Lumbar and sacral osteoarthritis    Chronic pain syndrome    Uncontrolled type 2 diabetes mellitus with hyperglycemia (HCC)    Acute colitis    Acute hyperglycemia    Accelerated hypertension    Severe headache    Osteoarthritis of hips, bilateral    PTSD (post-traumatic stress disorder)    Severe hypertension             LOS: 4 days     ICU LOS:  3d     Vital signs in last 12 hours:   Patient Vitals for the past 12 hrs:   Temp Pulse Resp BP SpO2   05/15/21 0018 -- (!) 50 14 -- 100 %    05/15/21 0016 -- -- -- -- 100 %   05/15/21 0005 -- -- -- -- 99 %   05/14/21 2303 98.2 F (36.8 C) -- -- -- --   05/14/21 2230 -- (!) 53 14 (!) 87/67 99 %   05/14/21 2200 -- (!) 54 14 94/69 100 %   05/14/21 2131 -- (!) 57 14 (!) 89/66 100 %   05/14/21 2101 -- 61 14 (!) 84/63 100 %   05/14/21 2031 -- (!) 57 15 (!) 88/63 100 %   05/14/21 2015 -- -- -- -- 100 %   05/14/21 2014 -- -- -- -- 100 %   05/14/21 2002 -- (!) 55 14 -- 100 %   05/14/21 2001 -- 70 14 107/83 100 %   05/14/21 1946 -- -- -- -- 100 %   05/14/21 1931 -- (!) 52 14 127/85 100 %   05/14/21 1922 98.9 F (37.2 C) -- -- -- --  05/14/21 1800 -- (!) 52 14 123/81 100 %   05/14/21 1700 -- 68 14 120/88 100 %   05/14/21 1625 -- (!) 57 14 -- 98 %   05/14/21 1600 -- 61 16 107/74 100 %   05/14/21 1540 98.2 F (36.8 C) -- -- -- --   05/14/21 1500 -- (!) 55 16 (!) 85/63 97 %   05/14/21 1400 -- (!) 59 -- (!) 72/51 100 %          ABG Results (Last 7):     Recent Labs     05/14/21  1449 05/14/21  1257 05/13/21  0547 05/12/21  1657 05/12/21  0516 05/12/21  0104 05/11/21  2155   PH 7.569* 7.477* 7.481* 7.470* 7.407 7.282* 7.192*   PCO2 40.6 50.6* 51.6* 54.4* 64.4* 95.3* 114.2*   PO2 61.0* 62.0* 89.0 51.0* 66.0* 169.0* 70.0*   HCO3 37.1* 37.4* 38.5* 39.5* 40.5* 45.2* 44.1*   BE 15* 14* 15* 16* 16* 18* 16*   O2ST 94.0 92.0 97.0 86.0* 92.0 99.0 87.0*   SITE L Radial L Radial R Radial R Radial L Radial L Radial L Radial   SAMPLE Art Art Art Art Art Art Art   FIO2 35 30 60 40 60 100 50   HDEV Vent Vent Vent Vent Vent Vent BiPAP            ARDS is classified according to the degree of hypoxemia (PaO2/FiO2 ratio),  mild (PaO2/FiO2, 201-300), moderate (PaO2/FiO2, 101-200), and severe (PaO2/FiO2???100)       Airway:     Airway - Continuous Aspiration of Subglottic Secretions (CASS) Tube 05/11/21 Oral (Active)   Placement Date/Time: 05/11/21 2315   Number of Attempts: 1  Inserted By: DR Holmes Jumper  Present on Admission/Arrival: No  Location: Oral  Placement Verified: (c)  Chest x-ray;BBS;Auscultation;EtCO2;Placement not verified (comment)  Airway Types: En...   Number of days: 3       Airway - Endotracheal Tube 05/11/21 Oral (Active)   Placement Date/Time: 05/11/21 2315   Number of Attempts: 1  Inserted By: Jeannette Cough, MD  Present on Admission/Arrival: Yes  Location: Oral  Placement Verified: Auscultation;BBS;Chest x-ray;EtCO2  Airway Types: Endotracheal, cuffed  Airway Tube Si...   Number of days: 3       Airway - Continuous Aspiration of Subglottic Secretions (CASS) Tube 05/11/21 Oral (Active)   Insertion Depth (cm) 26 cm 05/15/21 0018   Line Mark Lips 05/15/21 0018   Side Secured Right 05/15/21 0018   Cuff Pressure 26 cmH20 05/15/21 0018   Site Assessment Clean, dry, & intact 05/15/21 0018   Suction on Yes 05/15/21 0018       Airway - Endotracheal Tube 05/11/21 Oral (Active)   Insertion Depth (cm) 26 cm 05/14/21 1946   Line Mark Lips 05/14/21 1946   Side Secured Device;Left 05/14/21 1946   Cuff Pressure 28 cmH20 05/14/21 1625   Site Assessment Clean, dry, & intact 05/14/21 1946         Ventilator Settings:   Ventilator Mode: Other (comment)  FIO2 (%): 35 %, SpO2/FIO2 Ratio: 285.71, CMV Rate Set: 14, Back-Up Rate: 14, Vt Set (ml): 400 ml, PEEP/VENT (cm H2O): 5 cm H20, I:E Ratio: 1:3.0, Insp Time (sec): 1.07 sec, Insp Flow (l/min): 42.6 l/min, Insp Rise Time %: 25 %, Flow Trigger: 3, Exp Time (sec): 3.22 sec, Exp Flow (l/min): 43.8 l/sec     Vt Set (ml): 400 ml Vt Exhaled (Machine Breath) (ml): 502 ml  Ventilator Pressures PIP Observed (cm H2O): 27 cm H2O, Plateau Pressure (cm H2O): 21.4 cm H2O, MAP (cm H2O): 10, PEEP/VENT (cm H2O): 5 cm H20         Safety & Alarms Circuit Temperature: 98.6 F (37 C), Backup Mode Checked/Apnea: Yes, Pressure Max: 50 cm H2O, Pressure Min: 10 cm H2O, Ve Min: 2, Ve Max: 20, Vt Min: 150 ml, Vt Max: 1000 ml, RR Min: 8, RR Max: 40       Breath Sounds:   Breath Sounds Bilateral: Diminished     Secretions:     Suction: Oral  Suction Device:  Yankauer  Sputum Method Obtained: Oral  Sputum Amount: Scant  Sputum Color/Odor: Pink tinged, Tan, White  Sputum Consistency: Thick     Chest X-Ray:     CXR Results  (Last 48 hours)                 05/14/21 0425  XR CHEST SNGL V Final result    Impression:  IMPRESSION:   1. No change in the triangular infiltrates left mid chest and left lung nodule.   2. Stable support tubes. Distal extent nasogastric tube cannot be evaluated. Not   on the film.   3. Large left upper lobe bulla.       Electronically signed by: Celine Rear, MD 05/14/2021 5:03 AM EST           Narrative:  Exam: AP portable chest       Clinical indication: respiratory failure - ICU patient       Comparison: 05/11/2021;         Results:  Stable wedge-shaped infiltrate left mid chest. No change left lower   lobe lung nodule 18 mm. Large bulla in the left upper lobe.   No pneumothorax.   Endotracheal tube and nasogastric tube in customary position. The distal extent   of nasogastric tube not included on the film and cannot be evaluated.       Heart normal.  Mediastinal contours are normal.        No free air is seen under the hemidiaphragms.   Osseous structures intact.                    XR Results (most recent):  Results from Hospital Encounter encounter on 05/11/21    XR CHEST SNGL V    Narrative  Exam: AP portable chest    Clinical indication: respiratory failure - ICU patient    Comparison: 05/11/2021;    Results:  Stable wedge-shaped infiltrate left mid chest. No change left lower  lobe lung nodule 18 mm. Large bulla in the left upper lobe.  No pneumothorax.  Endotracheal tube and nasogastric tube in customary position. The distal extent  of nasogastric tube not included on the film and cannot be evaluated.    Heart normal.  Mediastinal contours are normal.    No free air is seen under the hemidiaphragms.   Osseous structures intact.    Impression  IMPRESSION:  1. No change in the triangular infiltrates left mid chest and left lung nodule.  2.  Stable support tubes. Distal extent nasogastric tube cannot be evaluated. Not  on the film.  3. Large left upper lobe bulla.    Electronically signed by: Celine Rear, MD 05/14/2021 5:03 AM EST        Weaning Trial:   Spontaneous Breathing Trial Complete: No (Comments) (05/14/21 1625)  RASS  [place] Agitation Sedation Scale (RASS): Moderate sedation  Weaning Parameters:    Resp Rate Observed: 19 (05/13/21 0751)   Ve: 5.8  VT: 281   RSBI: 76  NIF: -38  PEF: 20   VC: 473       Summary:   Patient is on the vent no respiratory distress noted at this time, she  has a cough with suction vie oral. No change  on the vent machine. Will continue to monitor patient on the vent. ?            Plan:     Continue to monitor patient on APVcmv on the venilator    Electronically Signed By: Renate IVAR Daniels, RT     May 15, 2021

## 2021-05-15 NOTE — Progress Notes (Signed)
 Daily Ventilator Progress Note      Name:  [name] [name]  Gender:  F    DOB:  Nov 10, 1950 Age:  70 y.o.    MRN:  [id number]  Admit Date:  05/11/2021      Primary Encounter Diagnosis:       The primary encounter diagnosis was Acute respiratory failure with hypoxia and hypercapnia (HCC). Diagnoses of Leukocytosis, unspecified type, Acute hyperkalemia, and Hypercarbia were also pertinent to this visit.      Active Problem List:     Patient Active Problem List    Diagnosis    Respiratory failure (HCC)    Acute CHF (congestive heart failure) (HCC)    UTI (urinary tract infection)    COPD exacerbation (HCC)    Acute hypoxemic respiratory failure (HCC)    Chronic respiratory failure with hypoxia (HCC)     On 3L O2 chronically.      Headache    Abdominal pain    SIRS (systemic inflammatory response syndrome) (HCC)    Hyperkalemia    Obtunded    Acute renal failure (ARF) (HCC)    Septic shock (HCC)    Metabolic encephalopathy    Dehydration    COPD with acute exacerbation (HCC)    Acute-on-chronic kidney injury (HCC)    Chest pain    Dyspnea    Type 2 diabetes mellitus with diabetic neuropathy (HCC)    Narcotic bowel syndrome (HCC)    Cannabinoid hyperemesis syndrome    Gastritis    Nausea & vomiting    Asthma with acute exacerbation    Lactic acidosis    Leukocytosis    Sepsis (HCC)    Asthma exacerbation    Type 2 diabetes mellitus with nephropathy (HCC)    Sacroiliitis (HCC)    Spondylosis of lumbar region without myelopathy or radiculopathy    Lumbar and sacral osteoarthritis    Chronic pain syndrome    Uncontrolled type 2 diabetes mellitus with hyperglycemia (HCC)    Acute colitis    Acute hyperglycemia    Accelerated hypertension    Severe headache    Osteoarthritis of hips, bilateral    PTSD (post-traumatic stress disorder)    Severe hypertension             LOS: 4 days     ICU LOS:  3d 14h     Vital signs in last 12 hours:   Patient Vitals for the past 12 hrs:   Temp Pulse Resp BP SpO2   05/15/21 1552 -- (!) 52 14 -- 100 %    05/15/21 1517 97.5 F (36.4 C) -- -- -- --   05/15/21 1315 -- -- -- -- 100 %   05/15/21 1152 -- 62 15 -- 100 %   05/15/21 1112 98.7 F (37.1 C) -- -- -- --   05/15/21 1011 -- -- 23 -- 100 %   05/15/21 0835 -- -- -- -- 100 %   05/15/21 0833 -- 66 12 -- 100 %   05/15/21 0800 -- -- -- -- 100 %   05/15/21 0734 97.5 F (36.4 C) -- -- -- --   05/15/21 0701 -- (!) 53 17 111/79 100 %   05/15/21 0601 -- (!) 52 15 107/80 100 %   05/15/21 0530 -- (!) 52 14 106/80 100 %   05/15/21 0500 -- (!) 52 14 117/84 100 %   05/15/21 0430 -- (!) 55 15 109/83 100 %  ABG Results (Last 7):     Recent Labs     05/15/21  0913 05/14/21  1449 05/14/21  1257 05/13/21  0547 05/12/21  1657 05/12/21  0516 05/12/21  0104   PH 7.358 7.569* 7.477* 7.481* 7.470* 7.407 7.282*   PCO2 55.0* 40.6 50.6* 51.6* 54.4* 64.4* 95.3*   PO2 93.0 61.0* 62.0* 89.0 51.0* 66.0* 169.0*   HCO3 30.9* 37.1* 37.4* 38.5* 39.5* 40.5* 45.2*   BE 5* 15* 14* 15* 16* 16* 18*   O2ST 97.0 94.0 92.0 97.0 86.0* 92.0 99.0   SITE L Radial L Radial L Radial R Radial R Radial L Radial L Radial   SAMPLE Art Art Art Art Art Art Art   FIO2 35 35 30 60 40 60 100   HDEV Vent Vent Vent Vent Vent Vent Vent            ARDS is classified according to the degree of hypoxemia (PaO2/FiO2 ratio),  mild (PaO2/FiO2, 201-300), moderate (PaO2/FiO2, 101-200), and severe (PaO2/FiO2???100)       Airway:     Airway - Continuous Aspiration of Subglottic Secretions (CASS) Tube 05/11/21 Oral (Active)   Placement Date/Time: 05/11/21 2315   Number of Attempts: 1  Inserted By: DR Holmes Jumper  Present on Admission/Arrival: No  Location: Oral  Placement Verified: (c) Chest x-ray;BBS;Auscultation;EtCO2;Placement not verified (comment)  Airway Types: En...   Number of days: 3       Airway - Endotracheal Tube 05/11/21 Oral (Active)   Placement Date/Time: 05/11/21 2315   Number of Attempts: 1  Inserted By: Jeannette Cough, MD  Present on Admission/Arrival: Yes  Location: Oral  Placement Verified:  Auscultation;BBS;Chest x-ray;EtCO2  Airway Types: Endotracheal, cuffed  Airway Tube Si...   Number of days: 3       Airway - Continuous Aspiration of Subglottic Secretions (CASS) Tube 05/11/21 Oral (Active)   Insertion Depth (cm) 26 cm 05/15/21 1552   Line Mark Lips 05/15/21 1552   Side Secured Device;Centered 05/15/21 1552   Cuff Pressure 26 cmH20 05/15/21 1552   Site Assessment Clean, dry, & intact 05/15/21 1552   Suction on Yes 05/15/21 1552   Amt Secretions Aspirated (mL) 0.5 mL 05/15/21 1552       Airway - Endotracheal Tube 05/11/21 Oral (Active)   Insertion Depth (cm) 26 cm 05/14/21 1946   Line Mark Lips 05/15/21 0800   Side Secured Device;Left 05/14/21 1946   Cuff Pressure 28 cmH20 05/14/21 1625   Site Assessment Clean, dry, & intact 05/15/21 0800         Ventilator Settings:   Ventilator Mode: Other (comment)  FIO2 (%): 35 %, SpO2/FIO2 Ratio: 285.71, CMV Rate Set: 14, Back-Up Rate: 14, Vt Set (ml): 400 ml, Pressure Support (cm H2O): 13 cm H2O, PEEP/VENT (cm H2O): 5 cm H20, I:E Ratio: 1:3.0, Insp Time (sec): 1.07 sec, Insp Flow (l/min): 38.5 l/min, Insp Rise Time %: 25 %, Flow Trigger: 3, Expiratory Sensitivity: 25 %, Exp Time (sec): 3.22 sec, Exp Flow (l/min): 39.5 l/sec     Vt Set (ml): 400 ml Vt Exhaled (Machine Breath) (ml): 412 ml Vt Spont (ml): 380 ml     Ventilator Pressures Pressure Support (cm H2O): 13 cm H2O, PIP Observed (cm H2O): 24 cm H2O, Plateau Pressure (cm H2O): 14 cm H2O, MAP (cm H2O): 9.3, PEEP/VENT (cm H2O): 5 cm H20, Auto PEEP Observed (cm H2O): 0 cm H2O         Safety & Alarms Circuit Temperature: 98.6 F (37 C),  Backup Mode Checked/Apnea: Yes, Pressure Max: 50 cm H2O, Pressure Min: 10 cm H2O, Ve Min: 2, Ve Max: 20, Vt Min: 150 ml, Vt Max: 1000 ml, RR Min: 8, RR Max: 40       Breath Sounds:   Breath Sounds Bilateral: Diminished     Secretions:     Suction: ET Tube  Suction Device: Inline suction catheter  Sputum Method Obtained: Endotracheal  Sputum Amount: Scant  Sputum Color/Odor:  Yellow, Pink tinged  Sputum Consistency: Thick     Chest X-Ray:     CXR Results  (Last 48 hours)                 05/14/21 0425  XR CHEST SNGL V Final result    Impression:  IMPRESSION:   1. No change in the triangular infiltrates left mid chest and left lung nodule.   2. Stable support tubes. Distal extent nasogastric tube cannot be evaluated. Not   on the film.   3. Large left upper lobe bulla.       Electronically signed by: Celine Rear, MD 05/14/2021 5:03 AM EST           Narrative:  Exam: AP portable chest       Clinical indication: respiratory failure - ICU patient       Comparison: 05/11/2021;         Results:  Stable wedge-shaped infiltrate left mid chest. No change left lower   lobe lung nodule 18 mm. Large bulla in the left upper lobe.   No pneumothorax.   Endotracheal tube and nasogastric tube in customary position. The distal extent   of nasogastric tube not included on the film and cannot be evaluated.       Heart normal.  Mediastinal contours are normal.        No free air is seen under the hemidiaphragms.   Osseous structures intact.                    XR Results (most recent):  Results from Hospital Encounter encounter on 05/11/21    XR CHEST SNGL V    Narrative  Exam: AP portable chest    Clinical indication: respiratory failure - ICU patient    Comparison: 05/11/2021;    Results:  Stable wedge-shaped infiltrate left mid chest. No change left lower  lobe lung nodule 18 mm. Large bulla in the left upper lobe.  No pneumothorax.  Endotracheal tube and nasogastric tube in customary position. The distal extent  of nasogastric tube not included on the film and cannot be evaluated.    Heart normal.  Mediastinal contours are normal.    No free air is seen under the hemidiaphragms.   Osseous structures intact.    Impression  IMPRESSION:  1. No change in the triangular infiltrates left mid chest and left lung nodule.  2. Stable support tubes. Distal extent nasogastric tube cannot be evaluated. Not  on the  film.  3. Large left upper lobe bulla.    Electronically signed by: Celine Rear, MD 05/14/2021 5:03 AM EST        Weaning Trial:   Spontaneous Breathing Trial Complete: Yes (05/15/21 0833)  RASS  [place] Agitation Sedation Scale (RASS): Light sedation      Weaning Parameters:    Resp Rate Observed: 12 (05/15/21 0833)   Ve: 4.6  VT: 380   RSBI: 31.6  NIF: -38  PEF: 20   VC: 473  Summary:   Pt weaned for 2 hours, it ended due to increased WOB.            Plan:     Continue to monitor patient on APVcmv on the venilator. Wean as tolerated tomorrow.    Electronically Signed By: Mardeen Clay, RT     May 15, 2021

## 2021-05-15 NOTE — Progress Notes (Signed)
[facility]    ICU Daily Progress Note 59'    Patient: [name] [name]               Sex: female          DOA: 05/11/2021  Date of Birth:  1950/07/06       Age:  70 y.o.        LOS:  LOS: 4 days        Code Status: Full Code        [x] I have reviewed the flowsheet and previous day???s notes. Events, vitals, medications and notes from last 24 hours reviewed.     ASSESSMENT:   - Acute hypoxic hypercapnic respiratory failure requiring intubation likely secondary to pneumonia - h/o intubation in the past -  Currently intubated 05/11/21.    - CAP with L mucus plugging / atelectasis.  H/o same in 2020 on CT.   - > attempted bronchoscopy aborted 05/14/21 due to blood in L main bronchus on inspection without canulation.  Lovenox d/ced.  PVLs LUE negative.   - Acute COPD exacerbation - with chronic COPD despite no smoking hx - followed previously by Dr. 14/2/22, now Dr. Helene Shoe.    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   - Hx Lung nodule - 05/11/21 CTA Chest with LLL nodule (2 cm) - Chronic stable s/p CT guided bx in 2017  - Hx HTN - Clonidine 0.2 mg BID, Losartan 100 mg daily, Diltiazem CD 240 mg daily  - Hx HLD - Crestor 5 mg daily  - Hx DMII  - Hx Thoracic ascending aortic aneurysm  - Hx Hepatitis C  - Hx LUE/Subclavian DVT - Eliquis   - Hx PTSD  - Hx Chronic pain     PLAN:     - Currently on mechanical ventilator.  Wean FiO2 to keep SpO2 > 88%.  Tried her on pressure support trial today.  Did a little bit better than yesterday but still not ready to extubate.  Gets tachypneic and tired during pressure support trials.  We will consider repeat bronchoscopy now that she has been off Lovenox in the next 24 hours if not improving.  - -Sedation: precedex and propofol. Titrate to RASS goal -1 to 0.  Check triglyceride levels.  - Continue Duonebs q6, Pulmicort BID, Hypertonic Saline nebs BID x 6 doses, Singulair 10 mg daily and Solumedrol 40 mg IV q8.   -  Goal MAP > 65 and SBP 100-140.  - Continue Zosyn and Vancomycin IV. Blood culture 11/29 1 out of 2 - Gram positive bacilli; corynebacterium.  Possible contaminant.  MRSA swab negative.  Will DC vancomycin today.- Avoid nephrotoxins. Renally dose medications. Strict I/Os. Daily weights.   - Electrolyte replacement per protocol. Goal K > 4, Phos > 3 and Mg > 2  - Glucommander per protocol. Goal BS 140-180  - SUP: Protonix 40 mg IV daily- DVT prophylaxis: SCDs and SQ Heparin  Further evaluation and management to follow depending on clinical progression and / or more available data.   Further management of other conditions per primary team and others involved.  35 ' cc time spent managing this patient, reviewing records and radiography and labs, discussing care with RN, RT and other physicians involved and documenting.  This is exclusive of any procedural time.      Recent Labs     05/15/21  0126 05/14/21  0136 05/13/21  0540   CREA 0.73 0.63 0.63   BUN 24* 21 20       Physical Exam   BP 111/79    Pulse 62    Temp 98.7 ??F (37.1 ??C)    Resp 15    Ht 5\' 7"  (1.702 m)    Wt 95.3 kg (210 lb 1.6 oz)    SpO2 100%    BMI 32.91 kg/m??    Intake/Output Summary (Last 24 hours) at 05/15/2021 1243  Last data filed at 05/15/2021 0700  Gross per 24 hour   Intake 1471.2 ml   Output 900 ml   Net 571.2 ml     Sedated on vent  GENERAL: Awake, alert on vent.  Use of accessory muscles of breathing during sedation holiday.  NECK: Supple. Trachea midline.   RESPIRATORY: Hyperinflated chest.  On Vent. Bilateral BS present, decreased at bases. No rales or rhonchi.   CARDIOVASCULAR: S1 and S2 present, regular. No murmur, rub, or thrill.   ABDOMEN: Soft and nontender with positive bowel sounds. No organomegaly.   NEUROLOGICAL: Awake, follows commands    Ventilator Settings:  Mode Rate Tidal Volume Pressure FiO2 PEEP   Other (comment)   400 ml  13 cm H2O 35 % 5 cm H20     Imaging:    XR Results (most recent):  Results from Hospital Encounter  encounter on 05/11/21    XR CHEST SNGL V    Narrative  Exam: AP portable chest    Clinical indication: respiratory failure - ICU patient    Comparison: 05/11/2021;    Results:  Stable wedge-shaped infiltrate left mid chest. No change left lower  lobe lung nodule 18 mm. Large bulla in the left upper lobe.  No pneumothorax.  Endotracheal tube and nasogastric tube in customary position. The distal extent  of nasogastric tube not included on the film and cannot be evaluated.    Heart normal.  Mediastinal contours are normal.    No free air is seen under the hemidiaphragms.   Osseous structures intact.    Impression  IMPRESSION:  1. No change in the triangular infiltrates left mid chest and left lung nodule.  2. Stable support tubes. Distal extent nasogastric tube cannot be evaluated. Not  on the film.  3. Large left upper lobe bulla.    Electronically signed by: 05/13/2021, MD 05/14/2021 5:03 AM EST     CT Results (most recent):  Results from Hospital Encounter encounter on 05/11/21    CT CHEST W CON PE PROTOCOL    Narrative  All CT exams at this facility use one or more dose reduction techniques  including automatic exposure control, mA/kV adjustment per patient's size, or  iterative reconstruction technique.    Clinical History: rule out PE.    Examination:  CTA CHEST with contrast. 3 mm spiral scanning was performed from the lung apices  to the upper pole of the kidneys. 3D MIP coronal and sagittal reconstruction  imaging has been performed.    Correlation:  08/30/2018, 02/18/2018 and 07/03/2015    Findings:    Degenerative changes of the spine. Secretions dependent portions of the trachea.  Dense airspace opacity with air bronchograms left upper lobe. There are  bilateral bullae. Dependent atelectasis lung bases.    2 cm left lower lobe nodule slightly increased in size from 08/30/2018, 02/18/2018  and 07/02/2025 ((prior measurement 1.7, 1.9 and 1.3 cm). No dissection thoracic  aorta. Ascending aorta measures 4.7 cm  unchanged  from prior studies. The  esophagus is not dilated.    No lymph node enlargement in the axilla, mediastinum or hila. No pulmonary  embolism. Visualized portions of the liver, pancreas, adrenal glands are  unremarkable. 2.3 cm splenic lesion unchanged from 02/18/2018. 1.8 cm midpole  right renal hypodensity, ultrasound characterization suggested.    Impression  IMPRESSION:  1. No pulmonary embolism.  2. Bilateral bullae  3. Left upper lobe pneumonia/volume loss. Radiographic follow-up to resolution  suggested.  4. 2 cm left lower lobe nodule not significantly changed from prior studies.  5. 2.3 cm stable splenic lesion, unchanged from 02/18/2018.  6. 1.8 cm probable cyst midpole right kidney, ultrasound characterization  suggested.    Electronically signed by: Jolayne Panther, MD 05/11/2021 10:11 PM EST     Marland Mcalpine, MD

## 2021-05-15 NOTE — Progress Notes (Signed)
Progress  Notes by Darrin Nipper, MD at 05/15/21 1535                Author: Darrin Nipper, MD  Service: Hospitalist  Author Type: Physician       Filed: 05/15/21 1541  Date of Service: 05/15/21 1535  Status: Addendum          Editor: Darrin Nipper, MD (Physician)          Related Notes: Original Note by Darrin Nipper, MD (Physician) filed at 05/15/21 1539                          INTERNAL MEDICINE PROGRESS NOTE      Date of note:      May 15, 2021      Patient:               [name] [name], 70 y.o., female   Admit Date:        05/11/2021   Length of Stay:  4 day(s)         Remains critically ill and intubated.      Problem List:      Patient Active Problem List        Diagnosis  Code         ?  Severe hypertension  I10     ?  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  PTSD (post-traumatic stress disorder)  F43.10     ?  Severe headache  R51.9     ?  Accelerated hypertension  I10     ?  Acute colitis  K52.9     ?  Acute hyperglycemia  R73.9     ?  Uncontrolled type 2 diabetes mellitus with hyperglycemia (HCC)  E11.65     ?  Spondylosis of lumbar region without myelopathy or radiculopathy  M47.816     ?  Lumbar and sacral osteoarthritis  M47.817     ?  Chronic pain syndrome  G89.4     ?  Sacroiliitis (HCC)  M46.1     ?  Type 2 diabetes mellitus with nephropathy (HCC)  E11.21     ?  Lactic acidosis  E87.20     ?  Leukocytosis  D72.829     ?  Sepsis (HCC)  A41.9     ?  Asthma exacerbation  J45.901     ?  Asthma with acute exacerbation  J45.901     ?  Gastritis  K29.70     ?  Nausea & vomiting  R11.2     ?  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  Cannabinoid hyperemesis syndrome  R11.2, F12.90     ?  Type 2 diabetes mellitus with diabetic neuropathy (HCC)  E11.40     ?  Chest pain  R07.9     ?  Dyspnea  R06.00     ?  Dehydration  E86.0     ?  COPD with acute exacerbation (HCC)  J44.1     ?  Acute-on-chronic kidney injury (HCC)  N17.9, N18.9     ?  Hyperkalemia  E87.5     ?  Obtunded  R40.1     ?   Acute renal failure (ARF) (HCC)  N17.9     ?  Septic shock (HCC)  A41.9, R65.21     ?  Metabolic encephalopathy  G93.41     ?  SIRS (systemic inflammatory response syndrome) (HCC)  R65.10     ?  Abdominal pain  R10.9     ?  Headache  R51.9     ?  Chronic respiratory failure with hypoxia (HCC)  J96.11     ?  COPD exacerbation (HCC)  J44.1     ?  Acute hypoxemic respiratory failure (HCC)  J96.01     ?  UTI (urinary tract infection)  N39.0     ?  Acute CHF (congestive heart failure) (HCC)  I50.9         ?  Respiratory failure (HCC)  J96.90             Subjective + interval history        Remains intubated.       Assessment :         Acute on chronic hypoxic hypercapnic respiratory failure requiring intubation on 05/12/21.         Acute COPD exacerbation - on 3L supplemental oxygen at baseline.   - 12/2: attempted bronchoscopy aborted 05/14/21 due to blood in L main bronchus on inspection without canulation. Lovenox d/ced. PVLs LUE  negative.       Alkalosis:   - on diamox.       Hyperkalemia: resolved.       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   -  ac on hold given blood in left main bronchus.   Chronic pain  2 cm left lower lobe lung nodule-will need outpatient follow-up   UDS positive for marijuana   Diabetic neuropathy             Plan :         Continue mechanical ventilation per ICU protocol   Weaning trial per ICU protocol   CXR showed wedge shaped opacity in left lung - patient will get bronchoscopy today   Duonebs every 6 hrs, pulmicort BID, solumedrol 40 mg IV every 8 hrs, montelukast 10 mg daily   Blood culture 1 out of 2 - Gram positive bacilli-> corynebacterium.. Continue Vanc + Zosyn  - could consider stopping Vanc tomorrow   Respiratory cultures - commensal flora          DVT ppx : Therapeutic Lovenox  GI prophylaxis-pantoprazole      - Code status: full code        Recommend to continue hospitalization.   Expected date of discharge: tbd   Plan for disposition - tbd        Objective:           Visit Vitals      BP  111/79     Pulse  62     Temp  97.5 ??F (36.4 ??C)     Resp  15     Ht  5\' 7"  (1.702 m)     Wt  95.3 kg (210 lb 1.6 oz)     SpO2  100%        BMI  32.91 kg/m??                    Intake/Output Summary (Last 24 hours) at 05/15/2021 1535   Last data filed at 05/15/2021 0700     Gross per 24 hour  Intake  1471.2 ml        Output  900 ml        Net  571.2 ml                Physical Exam:        GEN - intubated   HEENT - mucous membranes moist   Neck - supple, no JVD   Cardiac - RRR, S1, S2, no murmurs   Chest/Lungs - expiratory wheezing   Abdomen - soft, obese, non tender   Extremities - no clubbing/ cyanosis/ edema   Neuro - sedated   Skin - no rashes or lesions        Current medications:           Current Facility-Administered Medications:    ?  fentaNYL citrate (PF) injection 25 mcg, 25 mcg, IntraVENous, Q1H PRN, Imad, Melhem, MD, 25 mcg at 05/15/21 1122   ?  NOREPINephrine (LEVOPHED) 16,000 mcg in dextrose 5% 250 mL infusion, 0.5-16 mcg/min, IntraVENous, TITRATE, Tiffany Kocher, MD, Held at 05/14/21  2151   ?  heparin (porcine) injection 5,000 Units, 5,000 Units, SubCUTAneous, Q8H, Imad, Melhem, MD, 5,000 Units at 05/15/21 0827   ?  sodium chloride 3% hypertonic nebulizer soln, 4 mL, Nebulization, TID RT, Imad, Melhem, MD, 4 mL at 05/15/21 1315   ?  albuterol-ipratropium (DUO-NEB) 2.5 MG-0.5 MG/3 ML, 3 mL, Nebulization, Q8H RT, Imad, Melhem, MD, 3 mL at 05/15/21 0835   ?  dexmedeTOMidine in 0.9 % NaCl (PRECEDEX) 200 mcg/50 mL (4 mcg/mL) infusion soln, 0.1-1.5 mcg/kg/hr, IntraVENous, TITRATE, Gar Gibbon  C, NP, Last Rate: 4.7 mL/hr at 05/15/21 1340, 0.2 mcg/kg/hr at 05/15/21 1340   ?  hydrALAZINE (APRESOLINE) 20 mg/mL injection 20 mg, 20 mg, IntraVENous, Q6H PRN, Lottie Dawson, NP, 20 mg at 05/14/21 6644   ?  propofol (DIPRIVAN) 10 mg/mL infusion, 0-50  mcg/kg/min, IntraVENous, TITRATE, Simoes, Ruchita S, MD, Last Rate: 28.3 mL/hr at 05/15/21  1340, 50 mcg/kg/min at 05/15/21 1340   ?  pantoprazole (PROTONIX) 40 mg in 0.9% sodium chloride 10 mL injection, 40 mg, IntraVENous, DAILY, Lottie Dawson, NP, 40 mg at 05/15/21  0347   ?  dextrose (D50W) injection syrg 5-25 g, 10-50 mL, IntraVENous, PRN, Lottie Dawson, NP   ?  glucagon (GLUCAGEN) injection 1 mg, 1 mg, IntraMUSCular, PRN, Lottie Dawson, NP   ?  insulin glargine (LANTUS) injection 1-100 Units, 1-100 Units, SubCUTAneous, QHS, Lottie Dawson, NP, 16 Units at 05/14/21 2140   ?  insulin lispro (HUMALOG) injection 1-100 Units, 1-100 Units, SubCUTAneous, Q6H, Lottie Dawson, NP, 1 Units at 05/15/21 1437   ?  docusate (COLACE) 50 mg/5 mL oral liquid 100 mg, 100 mg, Oral, BID, Imad, Melhem, MD, 100 mg at 05/15/21 0827   ?  piperacillin-tazobactam (ZOSYN) 3.375 g in 0.9% sodium chloride (MBP/ADV) 100 mL MBP, 3.375 g, IntraVENous, Q8H, Erling Conte, MD, Last  Rate: 25 mL/hr at 05/15/21 0913, 3.375 g at 05/15/21 0913   ?  budesonide (PULMICORT) 500 mcg/2 ml nebulizer suspension, 500 mcg, Nebulization, BID RT, Lauralee Evener, PA-C, 500 mcg at 05/15/21 4259   ?  methylPREDNISolone (PF) (SOLU-MEDROL) injection 40 mg, 40 mg, IntraVENous, Q8H, Reid, Hailey, PA-C, 40 mg at 05/15/21 1437   ?  montelukast (SINGULAIR) tablet 10 mg, 10 mg, Oral, QHS, Reid, Hailey, PA-C, 10 mg at 05/14/21 2142   ?  dextrose (D50W) injection syrg 10-15 g, 20-30 mL, IntraVENous, PRN, Lauralee Evener, PA-C   ?  white petrolatum-mineral oiL (LACRILUBE S.O.P.) ointment 1 Each, 1 Each, Both Eyes, DAILY, Erling Conte, MD, 1 Each at 05/15/21 0827   ?  PHOSPHATE ELECTROLYTE REPLACEMENT PROTOCOL STANDARD DOSING, 1 Each, Other, PRN, Coulter, Venetia Night, NP   ?  MAGNESIUM ELECTROLYTE REPLACEMENT PROTOCOL STANDARD DOSING, 1 Each, Other, PRN, Coulter, Venetia Night, NP   ?  POTASSIUM ELECTROLYTE REPLACEMENT PROTOCOL STANDARD DOSING, 1 Each,  Other, PRN, Coulter, Venetia Night, NP   ?  CALCIUM ELECTROLYTE REPLACEMENT PROTOCOL STANDARD DOSING, 1 Each, Other, PRN, Donnajean Lopes, NP, 1 Each at 05/13/21 1400        Labs:          Recent Results (from the past 24 hour(s))     GLUCOSE, POC          Collection Time: 05/14/21  5:48 PM         Result  Value  Ref Range            Glucose (POC)  186 (H)  65 - 105 mg/dL       GLUCOSE, POC          Collection Time: 05/14/21 11:07 PM         Result  Value  Ref Range            Glucose (POC)  220 (H)  65 - 105 mg/dL       METABOLIC PANEL, BASIC          Collection Time: 05/15/21  1:26 AM         Result  Value  Ref Range            Potassium  3.5  3.5 - 5.1 mEq/L       Chloride  103  98 - 107 mEq/L       Sodium  142  136 - 145 mEq/L       CO2  29  20 - 31 mEq/L       Glucose  209 (H)  74 - 106 mg/dl       BUN  24 (H)  9 - 23 mg/dl       Creatinine  1.61  0.55 - 1.02 mg/dl       GFR est AA  >09.6          GFR est non-AA  >60          Calcium  8.9  8.7 - 10.4 mg/dl       Anion gap  10  5 - 15 mmol/L       CBC WITH AUTOMATED DIFF          Collection Time: 05/15/21  1:26 AM         Result  Value  Ref Range            WBC  9.7  4.0 - 11.0 1000/mm3       RBC  3.88  3.60 - 5.20 M/uL       HGB  10.4 (L)  13.0 - 17.2 gm/dl       HCT  04.5 (L)  40.9 - 50.0 %       MCV  88.7  80.0 - 98.0 fL       MCH  26.8  25.4 - 34.6 pg       MCHC  30.2  30.0 - 36.0 gm/dl       PLATELET  811  914 - 450 1000/mm3  MPV  12.8 (H)  6.0 - 10.0 fL       RDW-SD  50.5 (H)  36.4 - 46.3         NRBC  0  0 - 0         IMMATURE GRANULOCYTES  0.4  0.0 - 3.0 %       NEUTROPHILS  82.3 (H)  34 - 64 %       LYMPHOCYTES  6.7 (L)  28 - 48 %       MONOCYTES  10.5  1 - 13 %       EOSINOPHILS  0.0  0 - 5 %       BASOPHILS  0.1  0 - 3 %       GLUCOSE, POC          Collection Time: 05/15/21  6:25 AM         Result  Value  Ref Range            Glucose (POC)  218 (H)  65 - 105 mg/dL       POC BLOOD GAS + LACTIC ACID          Collection Time: 05/15/21  9:13 AM          Result  Value  Ref Range            pH  7.358  7.350 - 7.450         PCO2  55.0 (H)  35.0 - 45.0 mm Hg       PO2  93.0  75 - 100 mm Hg       BICARBONATE  30.9 (HH)  18.0 - 26.0 mmol/L       O2 SAT  97.0  90 - 100 %       CO2, TOTAL  33.0 (H)  24 - 29 mmol/L       Lactic Acid  1.08  0.40 - 2.00 mmol/L       BASE EXCESS  5 (H)  -2 - 3 mmol/L       Sample type  Art          FIO2  35          SITE  L Radial          DEVICE  Vent          ALLENS TEST  Pass          Respiratory Rate  14          MODE  SPONT          PEEP/CPAP  5          PRESSURE SUPPORT  13          VTEXP  517          GLUCOSE, POC          Collection Time: 05/15/21 12:15 PM         Result  Value  Ref Range            Glucose (POC)  234 (H)  65 - 105 mg/dL           XR Results:   Results from Hospital Encounter encounter on 05/11/21      XR CHEST SNGL V      Narrative   Exam: AP portable chest      Clinical indication: respiratory failure - ICU patient      Comparison: 05/11/2021;  Results:  Stable wedge-shaped infiltrate left mid chest. No change left lower   lobe lung nodule 18 mm. Large bulla in the left upper lobe.   No pneumothorax.   Endotracheal tube and nasogastric tube in customary position. The distal extent   of nasogastric tube not included on the film and cannot be evaluated.      Heart normal.  Mediastinal contours are normal.      No free air is seen under the hemidiaphragms.   Osseous structures intact.      Impression   IMPRESSION:   1. No change in the triangular infiltrates left mid chest and left lung nodule.   2. Stable support tubes. Distal extent nasogastric tube cannot be evaluated. Not   on the film.   3. Large left upper lobe bulla.      Electronically signed by: Earline Mayotte, MD 05/14/2021 5:03 AM EST         CT Results:   Results from Hospital Encounter encounter on 05/11/21      CT CHEST W CON PE PROTOCOL      Narrative   All CT exams at this facility use one or more dose reduction techniques   including automatic  exposure control, mA/kV adjustment per patient's size, or   iterative reconstruction technique.      Clinical History: rule out PE.      Examination:   CTA CHEST with contrast. 3 mm spiral scanning was performed from the lung apices   to the upper pole of the kidneys. 3D MIP coronal and sagittal reconstruction   imaging has been performed.      Correlation:   08/30/2018, 02/18/2018 and 07/03/2015      Findings:      Degenerative changes of the spine. Secretions dependent portions of the trachea.   Dense airspace opacity with air bronchograms left upper lobe. There are   bilateral bullae. Dependent atelectasis lung bases.      2 cm left lower lobe nodule slightly increased in size from 08/30/2018, 02/18/2018   and 07/02/2025 ((prior measurement 1.7, 1.9 and 1.3 cm). No dissection thoracic   aorta. Ascending aorta measures 4.7 cm unchanged from prior studies. The   esophagus is not dilated.      No lymph node enlargement in the axilla, mediastinum or hila. No pulmonary   embolism. Visualized portions of the liver, pancreas, adrenal glands are   unremarkable. 2.3 cm splenic lesion unchanged from 02/18/2018. 1.8 cm midpole   right renal hypodensity, ultrasound characterization suggested.      Impression   IMPRESSION:   1. No pulmonary embolism.   2. Bilateral bullae   3. Left upper lobe pneumonia/volume loss. Radiographic follow-up to resolution   suggested.   4. 2 cm left lower lobe nodule not significantly changed from prior studies.   5. 2.3 cm stable splenic lesion, unchanged from 02/18/2018.   6. 1.8 cm probable cyst midpole right kidney, ultrasound characterization   suggested.      Electronically signed by: Jolayne Panther, MD 05/11/2021 10:11 PM EST         MRI Results:   Results from Orders Only encounter on 10/17/19      MRI LUMB SPINE W CONT         Nuclear Medicine Results:   No results found for this or any previous visit.         [place] Results:   Results from Hospital Encounter encounter on 12/24/17      [place] RUQ  Narrative   Examination: Ultrasound right upper quadrant.      INDICATION: Pain.      FINDINGS:      The liver appears grossly unremarkable. No focal hepatic lesions definitely   identified.      The gallbladder appears unremarkable. No gallbladder wall thickening. No   sonographic Murphy's sign. CBD within normal limits roughly measuring 0.3 cm.      Right kidney appears unremarkable and is normal in size. A 4 mm nonobstructing   renal calculus. Tiny cyst on previous CT dated 08/27/2016, not well visualized on   the current exam.      Proximal aorta and IVC appear unremarkable.      Impression   IMPRESSION:   1. No acute abnormalities.   2. No gallbladder wall thickening, gallstones or evidence of acute   cholecystitis.   3. Nonobstructing right renal calculus.         IR Results:   No results found for this or any previous visit.         VAS/US Results:   Results from Hospital Encounter encounter on 05/11/21      DUPLEX UPPER EXT VENOUS LEFT      Narrative   [id number]   [id number]   [id number]   Study ID: [id number]      [facility]   [address] [place],   [place]   [address]      Upper Extremity Venous Duplex Report      Name: JUDYE, LORINO Date: 05/12/2021 08:42 AM   MRN: [id number]                   Patient Location: [id number]   DOB: 11-Dec-1950               Age: 71 yrs   Gender: Female                Account #: [id number]   Reason For Study: History of DVT. ICD-10 Z86.71   Ordering Physician: Otilio Carpen   Referring Physician: [name]   Performed By: Kathrin Ruddy, RVT      Interpretation Summary   1. No evidence of deep venous thrombosis in the left upper extremity.   2. No evidence of deep venous thrombosis in the contralateral/ right   internal jugular and subclavian veins.   3. Compared to previous exam done on 01/24/2021, the previously documented   subclavian deep vein thrombosis has now resolved.    ___________________________________________________________________________   RIGHT ARM   The right internal jugular and subclavian veins were patent and   compressible, no evidence of intraluminal thrombus. Spectral Doppler exam   demonstrates normal venous flow in the right jugular vein. Spectral   Doppler exam demonstrates normal venous flow in the right subclavian vein.      LEFT ARM   The left upper extremity veins were examined with duplex ultrasound. The   internal jugular, subclavian, axillary and brachial veins were patent and   compressible with no intraluminal thrombus identified. Spontaneous,   pulsatile flow with normal augmentation was noted throughout the left   upper extremity.      LEFT SUPERFICIAL VEINS   The cephalic and basilic veins were patent and compressible with no   evidence of intraluminal thrombus identified.      SONOGRAPHERS COMMENTS   Technically difficult and suboptimal  due to positioning and patients   inability to cooperate.      Electronically signed byDr. Charlane Ferretti, M.D.   05/12/2021 04:30 PM         Critical care time: 1230 to 1305: of critical care time spent.      Total clinical care time was 37  minutes of which more than 50% was spent in coordination of care and counseling (time spent with patient/family face to face, physical exam, reviewing laboratory and imaging investigations, speaking with physicians and  nursing staff involved in this patient's care).       Dragon medical dictation system was used for part of this note. Unintentional voice recognition errors may occur      Albertine Patricia. D.O.      Hospitalist   [facility]   May 15, 2021   Time: 7:52 AM

## 2021-05-16 LAB — CBC WITH AUTO DIFFERENTIAL
Basophils %: 0 % (ref 0–3)
Eosinophils %: 0.1 % (ref 0–5)
Hematocrit: 30.6 % — ABNORMAL LOW (ref 37.0–50.0)
Hemoglobin: 9.6 gm/dl — ABNORMAL LOW (ref 13.0–17.2)
Immature Granulocytes: 0.5 % (ref 0.0–3.0)
Lymphocytes %: 6.8 % — ABNORMAL LOW (ref 28–48)
MCH: 27 pg (ref 25.4–34.6)
MCHC: 31.4 gm/dl (ref 30.0–36.0)
MCV: 86 fL (ref 80.0–98.0)
MPV: 12.9 fL — ABNORMAL HIGH (ref 6.0–10.0)
Monocytes %: 8.2 % (ref 1–13)
Neutrophils %: 84.4 % — ABNORMAL HIGH (ref 34–64)
Nucleated RBCs: 0 (ref 0–0)
Platelets: 176 10*3/uL (ref 140–450)
RBC: 3.56 M/uL — ABNORMAL LOW (ref 3.60–5.20)
RDW-SD: 49.5 — ABNORMAL HIGH (ref 36.4–46.3)
WBC: 10.2 10*3/uL (ref 4.0–11.0)

## 2021-05-16 LAB — BASIC METABOLIC PANEL
Anion Gap: 9 mmol/L (ref 5–15)
BUN: 34 mg/dl — ABNORMAL HIGH (ref 9–23)
CO2: 29 mEq/L (ref 20–31)
Calcium: 8.8 mg/dl (ref 8.7–10.4)
Chloride: 104 mEq/L (ref 98–107)
Creatinine: 0.68 mg/dl (ref 0.55–1.02)
EGFR IF NonAfrican American: >60
GFR African American: >60
Glucose: 253 mg/dl — ABNORMAL HIGH (ref 74–106)
Potassium: 3.3 mEq/L — ABNORMAL LOW (ref 3.5–5.1)
Sodium: 142 mEq/L (ref 136–145)

## 2021-05-16 LAB — POCT GLUCOSE
POC Glucose: 224 mg/dL — ABNORMAL HIGH (ref 65–105)
POC Glucose: 274 mg/dL — ABNORMAL HIGH (ref 65–105)
POC Glucose: 290 mg/dL — ABNORMAL HIGH (ref 65–105)
POC Glucose: 275 mg/dL — ABNORMAL HIGH (ref 65–105)

## 2021-05-16 LAB — CULTURE, BLOOD 1

## 2021-05-16 LAB — MAGNESIUM
Magnesium: 1.9 mg/dL (ref 1.6–2.6)
Magnesium: 1.9 mg/dL (ref 1.6–2.6)

## 2021-05-16 LAB — POTASSIUM
Potassium: 3.9 mEq/L (ref 3.5–5.1)
Potassium: 3.9 mEq/L (ref 3.5–5.1)
Potassium: 3.9 mEq/L (ref 3.5–5.1)
Potassium: 3.9 mEq/L (ref 3.5–5.1)

## 2021-05-16 LAB — PHOSPHORUS
Phosphorus: 3.2 mg/dL (ref 2.4–5.1)
Phosphorus: 3.2 mg/dL (ref 2.4–5.1)

## 2021-05-16 LAB — TRIGLYCERIDES: Triglycerides: 186 mg/dl — ABNORMAL HIGH (ref 0–150)

## 2021-05-16 LAB — GLUCOSE, POC
Glucose (POC): 224 mg/dL — ABNORMAL HIGH (ref 65–105)
Glucose (POC): 274 mg/dL — ABNORMAL HIGH (ref 65–105)
Glucose (POC): 290 mg/dL — ABNORMAL HIGH (ref 65–105)
Glucose (POC): 275 mg/dL — ABNORMAL HIGH (ref 65–105)

## 2021-05-16 LAB — TRIGLYCERIDE: Triglyceride: 186 mg/dl — ABNORMAL HIGH (ref 0–150)

## 2021-05-16 LAB — CBC WITH AUTOMATED DIFF
BASOPHILS: 0 % (ref 0–3)
EOSINOPHILS: 0.1 % (ref 0–5)
HCT: 30.6 % — ABNORMAL LOW (ref 37.0–50.0)
HGB: 9.6 gm/dl — ABNORMAL LOW (ref 13.0–17.2)
IMMATURE GRANULOCYTES: 0.5 % (ref 0.0–3.0)
LYMPHOCYTES: 6.8 % — ABNORMAL LOW (ref 28–48)
MCH: 27 pg (ref 25.4–34.6)
MCHC: 31.4 gm/dl (ref 30.0–36.0)
MCV: 86 fL (ref 80.0–98.0)
MONOCYTES: 8.2 % (ref 1–13)
MPV: 12.9 fL — ABNORMAL HIGH (ref 6.0–10.0)
NEUTROPHILS: 84.4 % — ABNORMAL HIGH (ref 34–64)
NRBC: 0 (ref 0–0)
PLATELET: 176 10*3/uL (ref 140–450)
RBC: 3.56 M/uL — ABNORMAL LOW (ref 3.60–5.20)
RDW-SD: 49.5 — ABNORMAL HIGH (ref 36.4–46.3)
WBC: 10.2 10*3/uL (ref 4.0–11.0)

## 2021-05-16 LAB — METABOLIC PANEL, BASIC
Anion gap: 9 mmol/L (ref 5–15)
BUN: 34 mg/dl — ABNORMAL HIGH (ref 9–23)
CO2: 29 mEq/L (ref 20–31)
Calcium: 8.8 mg/dl (ref 8.7–10.4)
Chloride: 104 mEq/L (ref 98–107)
Creatinine: 0.68 mg/dl (ref 0.55–1.02)
GFR est AA: >60
GFR est non-AA: >60
Glucose: 253 mg/dl — ABNORMAL HIGH (ref 74–106)
Potassium: 3.3 mEq/L — ABNORMAL LOW (ref 3.5–5.1)
Sodium: 142 mEq/L (ref 136–145)

## 2021-05-16 LAB — CULTURE, BLOOD

## 2021-05-16 MED ORDER — LIDOCAINE HCL 1 % (10 MG/ML) IJ SOLN
10 mg/mL (1 %) | INTRAMUSCULAR | Status: AC
Start: 2021-05-16 — End: 2021-05-16
  Administered 2021-05-16: 15:00:00

## 2021-05-16 MED ORDER — POTASSIUM CHLORIDE 10 % ORAL LIQUID
20 mEq/15 mL | Freq: Once | ORAL | Status: AC
Start: 2021-05-16 — End: 2021-05-16
  Administered 2021-05-16: 12:00:00 via GASTROSTOMY

## 2021-05-16 MED ORDER — FUROSEMIDE 10 MG/ML IJ SOLN
10 mg/mL | Freq: Once | INTRAMUSCULAR | Status: AC
Start: 2021-05-16 — End: 2021-05-16
  Administered 2021-05-16: 21:00:00 via INTRAVENOUS

## 2021-05-16 MED FILL — DEXMEDETOMIDINE 200 MCG/50 ML (4 MCG/ML) IN 0.9 % SODIUM CHLORIDE IV: 200 mcg/50 mL (4 mcg/mL) | INTRAVENOUS | Qty: 50

## 2021-05-16 MED FILL — DOCUSATE SODIUM 50 MG/5 ML ORAL LIQUID: 50 mg/5 mL | ORAL | Qty: 10

## 2021-05-16 MED FILL — DIPRIVAN 10 MG/ML INTRAVENOUS EMULSION: 10 mg/mL | INTRAVENOUS | Qty: 100

## 2021-05-16 MED FILL — LANTUS U-100 INSULIN 100 UNIT/ML SUBCUTANEOUS SOLUTION: 100 unit/mL | SUBCUTANEOUS | Qty: 1

## 2021-05-16 MED FILL — PIPERACILLIN-TAZOBACTAM 3.375 GRAM IV SOLR: 3.375 gram | INTRAVENOUS | Qty: 3.38

## 2021-05-16 MED FILL — SOLU-MEDROL (PF) 40 MG/ML SOLUTION FOR INJECTION: 40 mg/mL | INTRAMUSCULAR | Qty: 1

## 2021-05-16 MED FILL — FENTANYL CITRATE (PF) 50 MCG/ML IJ SOLN: 50 mcg/mL | INTRAMUSCULAR | Qty: 2

## 2021-05-16 MED FILL — ADMELOG U-100 INSULIN LISPRO 100 UNIT/ML SUBCUTANEOUS SOLUTION: 100 unit/mL | SUBCUTANEOUS | Qty: 1

## 2021-05-16 MED FILL — FUROSEMIDE 10 MG/ML IJ SOLN: 10 mg/mL | INTRAMUSCULAR | Qty: 2

## 2021-05-16 MED FILL — BUDESONIDE 0.5 MG/2 ML NEB SUSPENSION: 0.5 mg/2 mL | RESPIRATORY_TRACT | Qty: 1

## 2021-05-16 MED FILL — HEPARIN (PORCINE) 5,000 UNIT/ML IJ SOLN: 5000 unit/mL | INTRAMUSCULAR | Qty: 1

## 2021-05-16 MED FILL — SODIUM CHLORIDE 3 % NEB SOLUTION: 3 % | RESPIRATORY_TRACT | Qty: 15

## 2021-05-16 MED FILL — IPRATROPIUM-ALBUTEROL 2.5 MG-0.5 MG/3 ML NEB SOLUTION: 2.5 mg-0.5 mg/3 ml | RESPIRATORY_TRACT | Qty: 3

## 2021-05-16 MED FILL — XYLOCAINE 10 MG/ML (1 %) INJECTION SOLUTION: 10 mg/mL (1 %) | INTRAMUSCULAR | Qty: 20

## 2021-05-16 MED FILL — MONTELUKAST 10 MG TAB: 10 mg | ORAL | Qty: 1

## 2021-05-16 MED FILL — PANTOPRAZOLE 40 MG IV SOLR: 40 mg | INTRAVENOUS | Qty: 40

## 2021-05-16 MED FILL — ADMELOG U-100 INSULIN LISPRO 100 UNIT/ML SUBCUTANEOUS SOLUTION: 100 unit/mL | SUBCUTANEOUS | Qty: 3

## 2021-05-16 MED FILL — POTASSIUM CHLORIDE 10 % ORAL LIQUID: 20 mEq/15 mL | ORAL | Qty: 30

## 2021-05-16 NOTE — Progress Notes (Signed)
Problem: Non-Violent Restraints  Goal: Removal from restraints as soon as assessed to be safe  Outcome: Progressing Towards Goal     Problem: Pressure Injury - Risk of  Goal: *Prevention of pressure injury  Description: Document Braden Scale and appropriate interventions in the flowsheet.  Outcome: Progressing Towards Goal  Note: Pressure Injury Interventions:  Sensory Interventions: Assess need for specialty bed, Check visual cues for pain, Keep linens dry and wrinkle-free, Float heels    Moisture Interventions: Absorbent underpads, Apply protective barrier, creams and emollients, Check for incontinence Q2 hours and as needed, Internal/External urinary devices    Activity Interventions: Pressure redistribution bed/mattress(bed type)    Mobility Interventions: Pressure redistribution bed/mattress (bed type), Turn and reposition approx. every two hours(pillow and wedges)    Nutrition Interventions: Document food/fluid/supplement intake    Friction and Shear Interventions: Apply protective barrier, creams and emollients, HOB 30 degrees or less                Problem: Falls - Risk of  Goal: *Absence of Falls  Description: Document Schmid Fall Risk and appropriate interventions in the flowsheet.  Outcome: Progressing Towards Goal  Note: Fall Risk Interventions:  Mobility Interventions: Bed/chair exit alarm    Mentation Interventions: Adequate sleep, hydration, pain control    Medication Interventions: Bed/chair exit alarm    Elimination Interventions: Bed/chair exit alarm              Problem: Ventilator Management  Goal: *Adequate oxygenation and ventilation  Outcome: Progressing Towards Goal

## 2021-05-16 NOTE — Progress Notes (Signed)
 Daily Ventilator Progress Note      Name:  [name] [name]  Gender:  F    DOB:  Feb 10, 1951 Age:  70 y.o.    MRN:  [id number]  Admit Date:  05/11/2021      Primary Encounter Diagnosis:       The primary encounter diagnosis was Acute respiratory failure with hypoxia and hypercapnia (HCC). Diagnoses of Leukocytosis, unspecified type, Acute hyperkalemia, and Hypercarbia were also pertinent to this visit.      Active Problem List:     Patient Active Problem List    Diagnosis    Respiratory failure (HCC)    Acute CHF (congestive heart failure) (HCC)    UTI (urinary tract infection)    COPD exacerbation (HCC)    Acute hypoxemic respiratory failure (HCC)    Chronic respiratory failure with hypoxia (HCC)     On 3L O2 chronically.      Headache    Abdominal pain    SIRS (systemic inflammatory response syndrome) (HCC)    Hyperkalemia    Obtunded    Acute renal failure (ARF) (HCC)    Septic shock (HCC)    Metabolic encephalopathy    Dehydration    COPD with acute exacerbation (HCC)    Acute-on-chronic kidney injury (HCC)    Chest pain    Dyspnea    Type 2 diabetes mellitus with diabetic neuropathy (HCC)    Narcotic bowel syndrome (HCC)    Cannabinoid hyperemesis syndrome    Gastritis    Nausea & vomiting    Asthma with acute exacerbation    Lactic acidosis    Leukocytosis    Sepsis (HCC)    Asthma exacerbation    Type 2 diabetes mellitus with nephropathy (HCC)    Sacroiliitis (HCC)    Spondylosis of lumbar region without myelopathy or radiculopathy    Lumbar and sacral osteoarthritis    Chronic pain syndrome    Uncontrolled type 2 diabetes mellitus with hyperglycemia (HCC)    Acute colitis    Acute hyperglycemia    Accelerated hypertension    Severe headache    Osteoarthritis of hips, bilateral    PTSD (post-traumatic stress disorder)    Severe hypertension             LOS: 5 days     ICU LOS:  4d 14h     Vital signs in last 12 hours:   Patient Vitals for the past 12 hrs:   Temp Pulse Resp BP SpO2   05/16/21 1545 98.3 F (36.8 C) --  -- -- --   05/16/21 1537 -- (!) 55 14 -- 100 %   05/16/21 1500 -- 63 14 (!) 133/93 100 %   05/16/21 1400 -- (!) 59 14 125/85 100 %   05/16/21 1335 -- -- -- -- 100 %   05/16/21 1300 -- (!) 54 14 98/74 100 %   05/16/21 1231 -- (!) 56 14 99/76 100 %   05/16/21 1216 -- (!) 55 14 104/74 100 %   05/16/21 1215 -- (!) 55 14 -- 100 %   05/16/21 1200 -- (!) 56 14 112/81 100 %   05/16/21 1156 98.6 F (37 C) -- -- -- --   05/16/21 1146 -- (!) 56 14 98/73 99 %   05/16/21 1130 -- (!) 58 14 102/76 99 %   05/16/21 1116 -- (!) 55 14 (!) 89/69 99 %   05/16/21 1100 -- (!) 57 14 (!) 79/63 98 %   05/16/21  1036 -- 61 14 (!) 78/61 98 %   05/16/21 1030 -- 63 14 90/71 99 %   05/16/21 1026 -- 62 14 (!) 88/74 100 %   05/16/21 1021 -- 65 14 105/84 100 %   05/16/21 1016 -- 63 14 110/89 96 %   05/16/21 1002 -- 65 26 (!) 127/92 96 %   05/16/21 1000 -- 63 19 (!) 124/93 96 %   05/16/21 0905 -- -- 18 -- 98 %   05/16/21 0902 -- -- -- -- 98 %   05/16/21 0900 -- 82 16 (!) 153/101 96 %   05/16/21 0859 -- -- -- -- 99 %   05/16/21 0800 -- 72 16 (!) 154/94 100 %   05/16/21 0744 -- 71 13 -- 100 %   05/16/21 0740 98.1 F (36.7 C) -- -- -- --   05/16/21 0700 -- (!) 58 14 (!) 145/87 100 %   05/16/21 0613 -- (!) 44 14 -- 100 %   05/16/21 0606 -- (!) 49 14 135/84 100 %   05/16/21 0513 -- (!) 49 14 -- 100 %   05/16/21 0506 -- (!) 47 14 126/82 100 %   05/16/21 0500 97.7 F (36.5 C) -- -- -- --   05/16/21 0421 -- (!) 52 14 -- 100 %   05/16/21 0413 -- (!) 51 14 -- 100 %   05/16/21 0400 -- (!) 53 14 124/86 100 %          ABG Results (Last 7):     Recent Labs     05/15/21  0913 05/14/21  1449 05/14/21  1257 05/13/21  0547 05/12/21  1657 05/12/21  0516 05/12/21  0104   PH 7.358 7.569* 7.477* 7.481* 7.470* 7.407 7.282*   PCO2 55.0* 40.6 50.6* 51.6* 54.4* 64.4* 95.3*   PO2 93.0 61.0* 62.0* 89.0 51.0* 66.0* 169.0*   HCO3 30.9* 37.1* 37.4* 38.5* 39.5* 40.5* 45.2*   BE 5* 15* 14* 15* 16* 16* 18*   O2ST 97.0 94.0 92.0 97.0 86.0* 92.0 99.0   SITE L Radial L Radial L Radial  R Radial R Radial L Radial L Radial   SAMPLE Art Art Art Art Art Art Art   FIO2 35 35 30 60 40 60 100   HDEV Vent Vent Vent Vent Vent Vent Vent            ARDS is classified according to the degree of hypoxemia (PaO2/FiO2 ratio),  mild (PaO2/FiO2, 201-300), moderate (PaO2/FiO2, 101-200), and severe (PaO2/FiO2???100)       Airway:     Airway - Continuous Aspiration of Subglottic Secretions (CASS) Tube 05/11/21 Oral (Active)   Placement Date/Time: 05/11/21 2315   Number of Attempts: 1  Inserted By: DR Holmes Jumper  Present on Admission/Arrival: No  Location: Oral  Placement Verified: (c) Chest x-ray;BBS;Auscultation;EtCO2;Placement not verified (comment)  Airway Types: En...   Number of days: 4       Airway - Endotracheal Tube 05/11/21 Oral (Active)   Placement Date/Time: 05/11/21 2315   Number of Attempts: 1  Inserted By: Jeannette Cough, MD  Present on Admission/Arrival: Yes  Location: Oral  Placement Verified: Auscultation;BBS;Chest x-ray;EtCO2  Airway Types: Endotracheal, cuffed  Airway Tube Si...   Number of days: 4       Airway - Continuous Aspiration of Subglottic Secretions (CASS) Tube 05/11/21 Oral (Active)   Insertion Depth (cm) 26 cm 05/16/21 1537   Line Mark Lips 05/16/21 1537   Side Secured Right 05/16/21 1537  Cuff Pressure 30 cmH20 05/16/21 1537   Site Assessment Clean, dry, & intact 05/16/21 1537   Suction on Yes 05/16/21 1537   Amt Secretions Aspirated (mL) 1 mL 05/16/21 1537       Airway - Endotracheal Tube 05/11/21 Oral (Active)   Insertion Depth (cm) 24 cm 05/15/21 2000   Line Mark Lips 05/15/21 2000   Side Secured Device;Left 05/14/21 1946   Cuff Pressure 28 cmH20 05/14/21 1625   Site Assessment Clean, dry, & intact 05/15/21 2000         Ventilator Settings:   Ventilator Mode: Other (comment)  FIO2 (%): 30 %, SpO2/FIO2 Ratio: 333.33, CMV Rate Set: 14, Back-Up Rate: 14, Vt Set (ml): 400 ml, PEEP/VENT (cm H2O): 5 cm H20, I:E Ratio: 1:2.6, Insp Time (sec): 1.2 sec, Insp Flow (l/min): 37 l/min,  Insp Rise Time %: 28 %, Flow Trigger: 3, Exp Time (sec): 3.09 sec, Exp Flow (l/min): 47 l/sec     Vt Set (ml): 400 ml Vt Exhaled (Machine Breath) (ml): 393 ml Vt Spont (ml): 565 ml     Ventilator Pressures PIP Observed (cm H2O): 29 cm H2O, Plateau Pressure (cm H2O): 18 cm H2O, MAP (cm H2O): 11, PEEP/VENT (cm H2O): 5 cm H20         Safety & Alarms Circuit Temperature: 98.6 F (37 C), Backup Mode Checked/Apnea: Yes, Pressure Max: 55 cm H2O, Pressure Min: 10 cm H2O, Ve Min: 2, Ve Max: 20, Vt Min: 150 ml, Vt Max: 1200 ml, RR Min: 8, RR Max: 40       Breath Sounds:   Breath Sounds Bilateral: Coarse     Secretions:     Suction: ET Tube  Suction Device: Inline suction catheter  Sputum Method Obtained: Endotracheal  Sputum Amount: Moderate  Sputum Color/Odor: White  Sputum Consistency: Thick     Chest X-Ray:     CXR Results  (Last 48 hours)      None             XR Results (most recent):  Results from Hospital Encounter encounter on 05/11/21    XR CHEST SNGL V    Narrative  Exam: AP portable chest    Clinical indication: respiratory failure - ICU patient    Comparison: 05/11/2021;    Results:  Stable wedge-shaped infiltrate left mid chest. No change left lower  lobe lung nodule 18 mm. Large bulla in the left upper lobe.  No pneumothorax.  Endotracheal tube and nasogastric tube in customary position. The distal extent  of nasogastric tube not included on the film and cannot be evaluated.    Heart normal.  Mediastinal contours are normal.    No free air is seen under the hemidiaphragms.   Osseous structures intact.    Impression  IMPRESSION:  1. No change in the triangular infiltrates left mid chest and left lung nodule.  2. Stable support tubes. Distal extent nasogastric tube cannot be evaluated. Not  on the film.  3. Large left upper lobe bulla.    Electronically signed by: Celine Rear, MD 05/14/2021 5:03 AM EST        Weaning Trial:   Spontaneous Breathing Trial Complete: Yes (PS: 8) (05/16/21 0744)  RASS  [place]  Agitation Sedation Scale (RASS): Drowsy      Weaning Parameters:    Resp Rate Observed: 13 (05/16/21 0744)   Ve: 4  VT: 422   RSBI: 31  NIF: -38  PEF: 20   VC: 473  Summary:   ?  S/p bronch wash today. Tolerated well          Plan:     Daily weaning trials as tolerated     Electronically Signed By: Rosina FORBES Bouche     May 16, 2021

## 2021-05-16 NOTE — Progress Notes (Signed)
[facility]    ICU Daily Progress Note 59'    Patient: [name] [name]               Sex: female          DOA: 05/11/2021  Date of Birth:  [birth year]       Age:  70 y.o.        LOS:  LOS: 5 days        Code Status: Full Code        [x] I have reviewed the flowsheet and previous day???s notes. Events, vitals, medications and notes from last 24 hours reviewed.     ASSESSMENT:   - Acute hypoxic hypercapnic respiratory failure requiring intubation likely secondary to pneumonia - h/o intubation in the past -  Currently intubated 05/11/21.  -  remains intubated.    - CAP with L mucus plugging / atelectasis.  H/o same in 2020 on CT.   - > attempted bronchoscopy aborted 05/14/21 due to blood in L main bronchus on inspection without canulation.  Lovenox d/ced.  PVLs LUE negative. - s/p full bronchoscopy 05/16/21 -> mild secretions / irritation L airways/ no mucus plug - dynamic collapse Sup segt LLL.       acute exac of copd-   - with chronic COPD despite no smoking hx - followed previously by Dr. 14/4/22, now Dr. Helene Shoe.    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   - Hx Lung nodule - 05/11/21 CTA Chest with LLL nodule (2 cm) - Chronic stable s/p CT guided bx in 2017  - Hx HTN - Clonidine 0.2 mg BID, Losartan 100 mg daily, Diltiazem CD 240 mg daily  - Hx HLD - Crestor 5 mg daily  - Hx DMII  - Hx Thoracic ascending aortic aneurysm  - Hx Hepatitis C  - Hx LUE/Subclavian DVT - Eliquis   - Hx PTSD  - Hx Chronic pain     PLAN:     - Currently on mechanical ventilator.   will do weaning after bronch and see if she is ready to extubate. So far has not passed PS trials due to tachypnea and high Pressure requirment.  May need to attempt extubation to Bipap with marginal weaning numbers.     Wean FiO2 to keep SpO2 > 88%.    - -Sedation: precedex and propofol. TG ok.  Received Fentanyl PRN.  Titrate to RASS goal -1 to 0.  Check triglyceride levels.  - Continue  Duonebs q6, Pulmicort BID, Hypertonic Saline nebs BID x 6 doses, Singulair 10 mg daily and Solumedrol 40 mg IV q8.   - Goal MAP > 65 and SBP 100-140.  - Continue Zosyn .  S/p Vancomycin  . Cultures sent from bronch.      Blood culture 11/29 1 out of 2 - Gram positive bacilli; corynebacterium.  Possible contaminant.  MRSA swab negative.    .- Avoid nephrotoxins. Renally dose medications. Strict I/Os. Daily weights.   - Glucommander per protocol. Goal BS 140-180  - SUP: Protonix 40 mg IV daily- DVT prophylaxis: SCDs and SQ Heparin  Further evaluation and management to follow depending on clinical progression and / or more available data.   Further management of other conditions per primary team and others involved.  35 ' cc time spent managing this patient, reviewing records and radiography and labs, discussing care with RN, RT and other physicians  involved and documenting.  This is exclusive of any procedural time.      Recent Labs     05/16/21  0447 05/15/21  0126 05/14/21  0136   CREA 0.68 0.73 0.63   BUN 34* 24* 21         Physical Exam   BP 102/76    Pulse (!) 58    Temp 98.1 ??F (36.7 ??C)    Resp 14    Ht 5\' 7"  (1.702 m)    Wt 95.7 kg (210 lb 15.7 oz)    SpO2 99%    BMI 33.04 kg/m??    Intake/Output Summary (Last 24 hours) at 05/16/2021 1150  Last data filed at 05/16/2021 1100  Gross per 24 hour   Intake 2112.59 ml   Output 1550 ml   Net 562.59 ml       Sedated on vent  GENERAL: Awake, alert on vent.    NECK: Supple. Trachea midline.   RESPIRATORY: Hyperinflated chest.  On Vent. Bilateral BS present, decreased at bases. No rales or rhonchi.   CARDIOVASCULAR: S1 and S2 present, regular. No murmur, rub, or thrill.   ABDOMEN: Soft and nontender with positive bowel sounds. No organomegaly.   NEUROLOGICAL: Awake, follows commands    Ventilator Settings:  Mode Rate Tidal Volume Pressure FiO2 PEEP   Other (comment)   400 ml  8 cm H2O 30 % 5 cm H20     Imaging:    XR Results (most recent):  Results from Hospital Encounter  encounter on 05/11/21    XR CHEST SNGL V    Narrative  Exam: AP portable chest    Clinical indication: respiratory failure - ICU patient    Comparison: 05/11/2021;    Results:  Stable wedge-shaped infiltrate left mid chest. No change left lower  lobe lung nodule 18 mm. Large bulla in the left upper lobe.  No pneumothorax.  Endotracheal tube and nasogastric tube in customary position. The distal extent  of nasogastric tube not included on the film and cannot be evaluated.    Heart normal.  Mediastinal contours are normal.    No free air is seen under the hemidiaphragms.   Osseous structures intact.    Impression  IMPRESSION:  1. No change in the triangular infiltrates left mid chest and left lung nodule.  2. Stable support tubes. Distal extent nasogastric tube cannot be evaluated. Not  on the film.  3. Large left upper lobe bulla.    Electronically signed by: 05/13/2021, MD 05/14/2021 5:03 AM EST     CT Results (most recent):  Results from Hospital Encounter encounter on 05/11/21    CT CHEST W CON PE PROTOCOL    Narrative  All CT exams at this facility use one or more dose reduction techniques  including automatic exposure control, mA/kV adjustment per patient's size, or  iterative reconstruction technique.    Clinical History: rule out PE.    Examination:  CTA CHEST with contrast. 3 mm spiral scanning was performed from the lung apices  to the upper pole of the kidneys. 3D MIP coronal and sagittal reconstruction  imaging has been performed.    Correlation:  08/30/2018, 02/18/2018 and 07/03/2015    Findings:    Degenerative changes of the spine. Secretions dependent portions of the trachea.  Dense airspace opacity with air bronchograms left upper lobe. There are  bilateral bullae. Dependent atelectasis lung bases.    2 cm left lower lobe nodule slightly increased in size from  08/30/2018, 02/18/2018  and 07/02/2025 ((prior measurement 1.7, 1.9 and 1.3 cm). No dissection thoracic  aorta. Ascending aorta measures 4.7 cm  unchanged from prior studies. The  esophagus is not dilated.    No lymph node enlargement in the axilla, mediastinum or hila. No pulmonary  embolism. Visualized portions of the liver, pancreas, adrenal glands are  unremarkable. 2.3 cm splenic lesion unchanged from 02/18/2018. 1.8 cm midpole  right renal hypodensity, ultrasound characterization suggested.    Impression  IMPRESSION:  1. No pulmonary embolism.  2. Bilateral bullae  3. Left upper lobe pneumonia/volume loss. Radiographic follow-up to resolution  suggested.  4. 2 cm left lower lobe nodule not significantly changed from prior studies.  5. 2.3 cm stable splenic lesion, unchanged from 02/18/2018.  6. 1.8 cm probable cyst midpole right kidney, ultrasound characterization  suggested.    Electronically signed by: Jolayne Panther, MD 05/11/2021 10:11 PM EST     Darrin Nipper, MD    Critical care time: 1000 to 1035: . Spent.

## 2021-05-16 NOTE — Progress Notes (Signed)
[facility]    ICU Daily Progress Note 25'    Patient: [name] [name]               Sex: female          DOA: 05/11/2021  Date of Birth:  1951-01-04       Age:  70 y.o.        LOS:  LOS: 5 days        Code Status: Full Code        [x] I have reviewed the flowsheet and previous day???s notes. Events, vitals, medications and notes from last 24 hours reviewed.     ASSESSMENT:   - Acute hypoxic hypercapnic respiratory failure requiring intubation likely secondary to pneumonia - h/o intubation in the past -  Currently intubated 05/11/21.  -   - CAP with L mucus plugging / atelectasis.  H/o same in 2020 on CT.   - > attempted bronchoscopy aborted 05/14/21 due to blood in L main bronchus on inspection without canulation.  Lovenox d/ced.  PVLs LUE negative. - s/p full bronchoscopy 05/16/21 -> mild secretions / irritation L airways/ no mucus plug - dynamic collapse Sup segt LLL.    - Acute COPD exacerbation - with chronic COPD despite no smoking hx - followed previously by Dr. 14/4/22, now Dr. Helene Shoe.    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   - Hx Lung nodule - 05/11/21 CTA Chest with LLL nodule (2 cm) - Chronic stable s/p CT guided bx in 2017  - Hx HTN - Clonidine 0.2 mg BID, Losartan 100 mg daily, Diltiazem CD 240 mg daily  - Hx HLD - Crestor 5 mg daily  - Hx DMII  - Hx Thoracic ascending aortic aneurysm  - Hx Hepatitis C  - Hx LUE/Subclavian DVT - Eliquis   - Hx PTSD  - Hx Chronic pain     PLAN:     - Currently on mechanical ventilator.   will do weaning after bronch and see if she is ready to extubate. So far has not passed PS trials due to tachypnea and high Pressure requirment.  May need to attempt extubation to Bipap with marginal weaning numbers.     Wean FiO2 to keep SpO2 > 88%.    - -Sedation: precedex and propofol. TG ok.  Received Fentanyl PRN.  Titrate to RASS goal -1 to 0.  Check triglyceride levels.  - Continue Duonebs q6, Pulmicort  BID, Hypertonic Saline nebs BID x 6 doses, Singulair 10 mg daily and Solumedrol 40 mg IV q8.   - Goal MAP > 65 and SBP 100-140.  - Continue Zosyn .  S/p Vancomycin  . Cultures sent from bronch.      Blood culture 11/29 1 out of 2 - Gram positive bacilli; corynebacterium.  Possible contaminant.  MRSA swab negative.    .- Avoid nephrotoxins. Renally dose medications. Strict I/Os. Daily weights.   - Glucommander per protocol. Goal BS 140-180  - SUP: Protonix 40 mg IV daily- DVT prophylaxis: SCDs and SQ Heparin  Further evaluation and management to follow depending on clinical progression and / or more available data.   Further management of other conditions per primary team and others involved.  35 ' cc time spent managing this patient, reviewing records and radiography and labs, discussing care with RN, RT and other physicians involved and documenting.  This is exclusive of any  procedural time.      Recent Labs     05/16/21  0447 05/15/21  0126 05/14/21  0136   CREA 0.68 0.73 0.63   BUN 34* 24* 21         Physical Exam   BP (!) 79/63    Pulse (!) 57    Temp 98.1 ??F (36.7 ??C)    Resp 14    Ht 5\' 7"  (1.702 m)    Wt 95.7 kg (210 lb 15.7 oz)    SpO2 98%    BMI 33.04 kg/m??    Intake/Output Summary (Last 24 hours) at 05/16/2021 1124  Last data filed at 05/16/2021 1100  Gross per 24 hour   Intake 2112.59 ml   Output 1550 ml   Net 562.59 ml       Sedated on vent  GENERAL: Awake, alert on vent.    NECK: Supple. Trachea midline.   RESPIRATORY: Hyperinflated chest.  On Vent. Bilateral BS present, decreased at bases. No rales or rhonchi.   CARDIOVASCULAR: S1 and S2 present, regular. No murmur, rub, or thrill.   ABDOMEN: Soft and nontender with positive bowel sounds. No organomegaly.   NEUROLOGICAL: Awake, follows commands    Ventilator Settings:  Mode Rate Tidal Volume Pressure FiO2 PEEP   Other (comment)   400 ml  8 cm H2O 30 % 5 cm H20     Imaging:    XR Results (most recent):  Results from Hospital Encounter encounter on  05/11/21    XR CHEST SNGL V    Narrative  Exam: AP portable chest    Clinical indication: respiratory failure - ICU patient    Comparison: 05/11/2021;    Results:  Stable wedge-shaped infiltrate left mid chest. No change left lower  lobe lung nodule 18 mm. Large bulla in the left upper lobe.  No pneumothorax.  Endotracheal tube and nasogastric tube in customary position. The distal extent  of nasogastric tube not included on the film and cannot be evaluated.    Heart normal.  Mediastinal contours are normal.    No free air is seen under the hemidiaphragms.   Osseous structures intact.    Impression  IMPRESSION:  1. No change in the triangular infiltrates left mid chest and left lung nodule.  2. Stable support tubes. Distal extent nasogastric tube cannot be evaluated. Not  on the film.  3. Large left upper lobe bulla.    Electronically signed by: 05/13/2021, MD 05/14/2021 5:03 AM EST     CT Results (most recent):  Results from Hospital Encounter encounter on 05/11/21    CT CHEST W CON PE PROTOCOL    Narrative  All CT exams at this facility use one or more dose reduction techniques  including automatic exposure control, mA/kV adjustment per patient's size, or  iterative reconstruction technique.    Clinical History: rule out PE.    Examination:  CTA CHEST with contrast. 3 mm spiral scanning was performed from the lung apices  to the upper pole of the kidneys. 3D MIP coronal and sagittal reconstruction  imaging has been performed.    Correlation:  08/30/2018, 02/18/2018 and 07/03/2015    Findings:    Degenerative changes of the spine. Secretions dependent portions of the trachea.  Dense airspace opacity with air bronchograms left upper lobe. There are  bilateral bullae. Dependent atelectasis lung bases.    2 cm left lower lobe nodule slightly increased in size from 08/30/2018, 02/18/2018  and 07/02/2025 ((prior measurement 1.7,  1.9 and 1.3 cm). No dissection thoracic  aorta. Ascending aorta measures 4.7 cm unchanged from  prior studies. The  esophagus is not dilated.    No lymph node enlargement in the axilla, mediastinum or hila. No pulmonary  embolism. Visualized portions of the liver, pancreas, adrenal glands are  unremarkable. 2.3 cm splenic lesion unchanged from 02/18/2018. 1.8 cm midpole  right renal hypodensity, ultrasound characterization suggested.    Impression  IMPRESSION:  1. No pulmonary embolism.  2. Bilateral bullae  3. Left upper lobe pneumonia/volume loss. Radiographic follow-up to resolution  suggested.  4. 2 cm left lower lobe nodule not significantly changed from prior studies.  5. 2.3 cm stable splenic lesion, unchanged from 02/18/2018.  6. 1.8 cm probable cyst midpole right kidney, ultrasound characterization  suggested.    Electronically signed by: Jolayne Panther, MD 05/11/2021 10:11 PM EST     Marland Mcalpine, MD

## 2021-05-16 NOTE — Procedures (Signed)
Procedures by  Marland Mcalpine, MD at 05/16/21 1402                Author: Marland Mcalpine, MD  Service: Pulmonary Disease  Author Type: Physician       Filed: 05/16/21 1417  Date of Service: 05/16/21 1402  Status: Addendum          Editor: Marland Mcalpine, MD (Physician)          Related Notes: Original Note by Marland Mcalpine, MD (Physician) filed at 05/16/21 1416               Bronchoscopy Procedure Note      Procedure Date:  05/16/2021      Pre-operative Diagnosis: Mucous plug      Procedures Performed:   .  Bronchoscopy (45409)   .  Bronchial washings        Indications: Hemoptysis, copious secretion, respiratory failure.      Post-operative Diagnosis: same.       Surgeon:  Vickey Huger, MD      Assistant(s): Endoscopy Technician-1Binnie Rail, RN   Endoscopy RN-1: Jackelyn Poling, RN       Anesthesia:   Lidocaine 1 % topical   Already on sedation per ICU protocol  Propofol and fentanyl      Procedure Details       The risks (including bleeding and infection) and benefits and alternatives of the procedure were explained to the patient's family and informed consent was obtained.    The patient was already in the ICU.  Appropriate time-out performed.  Under continuous monitoring of vital signs and pulse oximetry the patient was appropriately sedated using the above medications.    The regular size Ambu bronchoscope was inserted through the ET tube and advanced to the level of the carina.  The ET tube was in good position.   The trachea was midline.  The carina was sharp.  A full tracheo-bonchial tree survey was performed.     Findings included bronchitic changes bilaterally.  Significant irritation of the mucosa of the left main bronchus and extending into the left upper and left lower lobe bronchi.  The left upper lobe bronchi were patent.  The left lower lobe segmental bronchi  were somewhat dynamically collapse but mostly the superior segment of the left lower lobe had significant dynamic airway collapse.  The  right side had less irritation.  Copious but thin secretions gray appearing were suctioned.  Collected for samples.  The bronchoscope was subsequently retracted.         Findings:   As above.      Complications:   None.       Disposition:   Remains in ICU.        Plan:   1- Await specimen results          Bernadene Garside Larence Penning, MD

## 2021-05-16 NOTE — Progress Notes (Signed)
 Daily Ventilator Progress Note      Name:  [name] [name]  Gender:  F    DOB:  Jul 14, 1950 Age:  70 y.o.    MRN:  [id number]  Admit Date:  05/11/2021      Primary Encounter Diagnosis:       The primary encounter diagnosis was Acute respiratory failure with hypoxia and hypercapnia (HCC). Diagnoses of Leukocytosis, unspecified type, Acute hyperkalemia, and Hypercarbia were also pertinent to this visit.      Active Problem List:     Patient Active Problem List    Diagnosis    Respiratory failure (HCC)    Acute CHF (congestive heart failure) (HCC)    UTI (urinary tract infection)    COPD exacerbation (HCC)    Acute hypoxemic respiratory failure (HCC)    Chronic respiratory failure with hypoxia (HCC)     On 3L O2 chronically.      Headache    Abdominal pain    SIRS (systemic inflammatory response syndrome) (HCC)    Hyperkalemia    Obtunded    Acute renal failure (ARF) (HCC)    Septic shock (HCC)    Metabolic encephalopathy    Dehydration    COPD with acute exacerbation (HCC)    Acute-on-chronic kidney injury (HCC)    Chest pain    Dyspnea    Type 2 diabetes mellitus with diabetic neuropathy (HCC)    Narcotic bowel syndrome (HCC)    Cannabinoid hyperemesis syndrome    Gastritis    Nausea & vomiting    Asthma with acute exacerbation    Lactic acidosis    Leukocytosis    Sepsis (HCC)    Asthma exacerbation    Type 2 diabetes mellitus with nephropathy (HCC)    Sacroiliitis (HCC)    Spondylosis of lumbar region without myelopathy or radiculopathy    Lumbar and sacral osteoarthritis    Chronic pain syndrome    Uncontrolled type 2 diabetes mellitus with hyperglycemia (HCC)    Acute colitis    Acute hyperglycemia    Accelerated hypertension    Severe headache    Osteoarthritis of hips, bilateral    PTSD (post-traumatic stress disorder)    Severe hypertension             LOS: 5 days     ICU LOS:  4d 1h     Vital signs in last 12 hours:   Patient Vitals for the past 12 hrs:   Temp Pulse Resp BP SpO2   05/16/21 0100 -- (!) 57 14 (!)  141/99 100 %   05/16/21 0048 -- -- -- -- 100 %   05/16/21 0046 -- 62 17 -- 100 %   05/16/21 0000 -- (!) 57 19 (!) 137/98 100 %   05/15/21 2321 98 F (36.7 C) -- -- -- --   05/15/21 2300 -- (!) 58 14 121/87 99 %   05/15/21 2200 -- 60 16 (!) 130/91 100 %   05/15/21 2101 -- (!) 56 14 (!) 129/93 100 %   05/15/21 2100 -- (!) 56 14 (!) 129/93 100 %   05/15/21 2001 -- 71 14 (!) 134/99 100 %   05/15/21 2000 99.5 F (37.5 C) 71 14 -- 100 %   05/15/21 1956 -- -- -- -- 100 %   05/15/21 1955 -- -- -- -- 100 %   05/15/21 1952 -- (!) 55 13 -- 100 %   05/15/21 1552 -- (!) 52 14 -- 100 %   05/15/21  1517 97.5 F (36.4 C) -- -- -- --          ABG Results (Last 7):     Recent Labs     05/15/21  0913 05/14/21  1449 05/14/21  1257 05/13/21  0547 05/12/21  1657 05/12/21  0516 05/12/21  0104   PH 7.358 7.569* 7.477* 7.481* 7.470* 7.407 7.282*   PCO2 55.0* 40.6 50.6* 51.6* 54.4* 64.4* 95.3*   PO2 93.0 61.0* 62.0* 89.0 51.0* 66.0* 169.0*   HCO3 30.9* 37.1* 37.4* 38.5* 39.5* 40.5* 45.2*   BE 5* 15* 14* 15* 16* 16* 18*   O2ST 97.0 94.0 92.0 97.0 86.0* 92.0 99.0   SITE L Radial L Radial L Radial R Radial R Radial L Radial L Radial   SAMPLE Art Art Art Art Art Art Art   FIO2 35 35 30 60 40 60 100   HDEV Vent Vent Vent Vent Vent Vent Vent            ARDS is classified according to the degree of hypoxemia (PaO2/FiO2 ratio),  mild (PaO2/FiO2, 201-300), moderate (PaO2/FiO2, 101-200), and severe (PaO2/FiO2???100)       Airway:     Airway - Continuous Aspiration of Subglottic Secretions (CASS) Tube 05/11/21 Oral (Active)   Placement Date/Time: 05/11/21 2315   Number of Attempts: 1  Inserted By: DR Holmes Jumper  Present on Admission/Arrival: No  Location: Oral  Placement Verified: (c) Chest x-ray;BBS;Auscultation;EtCO2;Placement not verified (comment)  Airway Types: En...   Number of days: 4       Airway - Endotracheal Tube 05/11/21 Oral (Active)   Placement Date/Time: 05/11/21 2315   Number of Attempts: 1  Inserted By: Jeannette Cough, MD   Present on Admission/Arrival: Yes  Location: Oral  Placement Verified: Auscultation;BBS;Chest x-ray;EtCO2  Airway Types: Endotracheal, cuffed  Airway Tube Si...   Number of days: 4       Airway - Continuous Aspiration of Subglottic Secretions (CASS) Tube 05/11/21 Oral (Active)   Insertion Depth (cm) 26 cm 05/16/21 0046   Line Mark Lips 05/16/21 0046   Side Secured Right 05/16/21 0046   Cuff Pressure 26 cmH20 05/16/21 0046   Site Assessment Clean, dry, & intact 05/16/21 0046   Suction on Yes 05/16/21 0046   Amt Secretions Aspirated (mL) 0.5 mL 05/15/21 1552       Airway - Endotracheal Tube 05/11/21 Oral (Active)   Insertion Depth (cm) 24 cm 05/15/21 2000   Line Mark Lips 05/15/21 2000   Side Secured Device;Left 05/14/21 1946   Cuff Pressure 28 cmH20 05/14/21 1625   Site Assessment Clean, dry, & intact 05/15/21 2000         Ventilator Settings:   Ventilator Mode: Other (comment)  FIO2 (%): 35 %, SpO2/FIO2 Ratio: 285.71, CMV Rate Set: 14, Back-Up Rate: 14, Vt Set (ml): 400 ml, PEEP/VENT (cm H2O): 5 cm H20, I:E Ratio: 1:3.0, Insp Time (sec): 1.07 sec, Insp Flow (l/min): 30.4 l/min, Insp Rise Time %: 25 %, Flow Trigger: 3, Exp Time (sec): 3.22 sec, Exp Flow (l/min): 27.4 l/sec     Vt Set (ml): 400 ml Vt Exhaled (Machine Breath) (ml): 470 ml       Ventilator Pressures PIP Observed (cm H2O): 36 cm H2O, Plateau Pressure (cm H2O): 26.3 cm H2O, MAP (cm H2O): 12, PEEP/VENT (cm H2O): 5 cm H20         Safety & Alarms Circuit Temperature: 98.6 F (37 C), Backup Mode Checked/Apnea: Yes, Pressure Max: 50 cm H2O, Pressure Min:  10 cm H2O, Ve Min: 2, Ve Max: 20, Vt Min: 150 ml, Vt Max: 1000 ml, RR Min: 8, RR Max: 40       Breath Sounds:   Breath Sounds Bilateral: Diminished     Secretions:     Suction: ET Tube  Suction Device: Inline suction catheter  Sputum Method Obtained: Endotracheal  Sputum Amount: Large  Sputum Color/Odor: [employer], Pink tinged  Sputum Consistency: Thick     Chest X-Ray:     CXR Results  (Last 48 hours)                  05/14/21 0425  XR CHEST SNGL V Final result    Impression:  IMPRESSION:   1. No change in the triangular infiltrates left mid chest and left lung nodule.   2. Stable support tubes. Distal extent nasogastric tube cannot be evaluated. Not   on the film.   3. Large left upper lobe bulla.       Electronically signed by: Celine Rear, MD 05/14/2021 5:03 AM EST           Narrative:  Exam: AP portable chest       Clinical indication: respiratory failure - ICU patient       Comparison: 05/11/2021;         Results:  Stable wedge-shaped infiltrate left mid chest. No change left lower   lobe lung nodule 18 mm. Large bulla in the left upper lobe.   No pneumothorax.   Endotracheal tube and nasogastric tube in customary position. The distal extent   of nasogastric tube not included on the film and cannot be evaluated.       Heart normal.  Mediastinal contours are normal.        No free air is seen under the hemidiaphragms.   Osseous structures intact.                    XR Results (most recent):  Results from Hospital Encounter encounter on 05/11/21    XR CHEST SNGL V    Narrative  Exam: AP portable chest    Clinical indication: respiratory failure - ICU patient    Comparison: 05/11/2021;    Results:  Stable wedge-shaped infiltrate left mid chest. No change left lower  lobe lung nodule 18 mm. Large bulla in the left upper lobe.  No pneumothorax.  Endotracheal tube and nasogastric tube in customary position. The distal extent  of nasogastric tube not included on the film and cannot be evaluated.    Heart normal.  Mediastinal contours are normal.    No free air is seen under the hemidiaphragms.   Osseous structures intact.    Impression  IMPRESSION:  1. No change in the triangular infiltrates left mid chest and left lung nodule.  2. Stable support tubes. Distal extent nasogastric tube cannot be evaluated. Not  on the film.  3. Large left upper lobe bulla.    Electronically signed by: Celine Rear, MD 05/14/2021  5:03 AM EST        Weaning Trial:   Spontaneous Breathing Trial Complete: Yes (05/15/21 0833)  RASS  [place] Agitation Sedation Scale (RASS): Moderate sedation      Weaning Parameters:    Resp Rate Observed: 12 (05/15/21 0833)   Ve: 4.6  VT: 380   RSBI: 31.6  NIF: -38  PEF: 20   VC: 473       Summary:   Patient is on the vent alert  and awake, no respiratory distress noted at this time, she has large thick tan pink secretion via ett and oral. She has very strong cough with suction. ?            Plan:     Continue to monitor patient on APVcmv on the venilator    Electronically Signed By: Renate IVAR Daniels, RT     May 16, 2021

## 2021-05-17 ENCOUNTER — Inpatient Hospital Stay: Admit: 2021-05-17 | Payer: MEDICARE | Primary: Physician Assistant

## 2021-05-17 LAB — CBC WITH AUTO DIFFERENTIAL
Anisocytosis: 1
Basophils %: 0 % (ref 0–3)
Eosinophils %: 0 % (ref 0–5)
Hematocrit: 31.9 % — ABNORMAL LOW (ref 37.0–50.0)
Hemoglobin: 10.2 gm/dl — ABNORMAL LOW (ref 13.0–17.2)
Immature Granulocytes: 5 % — ABNORMAL HIGH (ref 0.0–3.0)
Lymphocytes %: 4.1 % — ABNORMAL LOW (ref 28–48)
MCH: 27.6 pg (ref 25.4–34.6)
MCHC: 32 gm/dl (ref 30.0–36.0)
MCV: 86.2 fL (ref 80.0–98.0)
MPV: 13.6 fL — ABNORMAL HIGH (ref 6.0–10.0)
Monocytes %: 6.1 % (ref 1–13)
Neutrophils %: 89.8 % — ABNORMAL HIGH (ref 34–64)
Nucleated RBCs: 0 (ref 0–0)
Platelet Comment: DECREASED
Platelets: 109 10*3/uL — ABNORMAL LOW (ref 140–450)
RBC: 3.7 M/uL (ref 3.60–5.20)
RDW-SD: 49.1 — ABNORMAL HIGH (ref 36.4–46.3)
WBC: 17.1 10*3/uL — ABNORMAL HIGH (ref 4.0–11.0)

## 2021-05-17 LAB — BASIC METABOLIC PANEL
Anion Gap: 12 mmol/L (ref 5–15)
BUN: 20 mg/dl (ref 9–23)
CO2: 24 mEq/L (ref 20–31)
Calcium: 9 mg/dl (ref 8.7–10.4)
Chloride: 105 mEq/L (ref 98–107)
Creatinine: 0.64 mg/dl (ref 0.55–1.02)
EGFR IF NonAfrican American: >60
GFR African American: >60
Glucose: 238 mg/dl — ABNORMAL HIGH (ref 74–106)
Potassium: 4.2 mEq/L (ref 3.5–5.1)
Sodium: 141 mEq/L (ref 136–145)

## 2021-05-17 LAB — POC BLOOD GAS + LACTIC ACID
BASE EXCESS: 6 mmol/L — ABNORMAL HIGH (ref <=3)
BICARBONATE: 30.7 mmol/L — CR (ref 18.0–26.0)
Base Excess: 6 mmol/L — ABNORMAL HIGH (ref <=3)
CO2 Total: 32 mmol/L — ABNORMAL HIGH (ref 24–29)
CO2, TOTAL: 32 mmol/L — ABNORMAL HIGH (ref 24–29)
FIO2: 30
FIO2: 30
HCO3: 30.7 mmol/L (ref 18.0–26.0)
LACTIC ACID: 0.55 mmol/L (ref 0.40–2.00)
Lactic Acid: 0.55 mmol/L (ref 0.40–2.00)
O2 SAT: 97 % (ref 90–100)
O2 Sat: 97 % (ref 90–100)
PCO2: 52.4 mm Hg — ABNORMAL HIGH (ref 35.0–45.0)
PCO2: 52.4 mm Hg — ABNORMAL HIGH (ref 35.0–45.0)
PEEP/CPAP: 5
PO2: 95 mm Hg (ref 75–100)
PO2: 95 mm Hg (ref 75–100)
PRESSURE SUPPORT: 8
Peep/Cpap: 5
Pressure Support: 8
pH: 7.377 (ref 7.350–7.450)
pH: 7.377 (ref 7.350–7.450)

## 2021-05-17 LAB — CULTURE, BLOOD 1: BLOOD CULTURE RESULT: NO GROWTH

## 2021-05-17 LAB — MAGNESIUM
Magnesium: 1.8 mg/dL (ref 1.6–2.6)
Magnesium: 1.8 mg/dL (ref 1.6–2.6)

## 2021-05-17 LAB — PROBNP, N-TERMINAL: BNP: 64 (ref 0–125)

## 2021-05-17 LAB — POCT GLUCOSE
POC Glucose: 237 mg/dL — ABNORMAL HIGH (ref 65–105)
POC Glucose: 228 mg/dL — ABNORMAL HIGH (ref 65–105)
POC Glucose: 223 mg/dL — ABNORMAL HIGH (ref 65–105)

## 2021-05-17 LAB — PHOSPHORUS
Phosphorus: 3.3 mg/dL (ref 2.4–5.1)
Phosphorus: 3.3 mg/dL (ref 2.4–5.1)

## 2021-05-17 LAB — METABOLIC PANEL, BASIC
Anion gap: 12 mmol/L (ref 5–15)
BUN: 20 mg/dl (ref 9–23)
CO2: 24 mEq/L (ref 20–31)
Calcium: 9 mg/dl (ref 8.7–10.4)
Chloride: 105 mEq/L (ref 98–107)
Creatinine: 0.64 mg/dl (ref 0.55–1.02)
GFR est AA: >60
GFR est non-AA: >60
Glucose: 238 mg/dl — ABNORMAL HIGH (ref 74–106)
Potassium: 4.2 mEq/L (ref 3.5–5.1)
Sodium: 141 mEq/L (ref 136–145)

## 2021-05-17 LAB — CBC WITH AUTOMATED DIFF
Anisocytosis: 1
BASOPHILS: 0 % (ref 0–3)
EOSINOPHILS: 0 % (ref 0–5)
HCT: 31.9 % — ABNORMAL LOW (ref 37.0–50.0)
HGB: 10.2 gm/dl — ABNORMAL LOW (ref 13.0–17.2)
IMMATURE GRANULOCYTES: 5 % — ABNORMAL HIGH (ref 0.0–3.0)
LYMPHOCYTES: 4.1 % — ABNORMAL LOW (ref 28–48)
MCH: 27.6 pg (ref 25.4–34.6)
MCHC: 32 gm/dl (ref 30.0–36.0)
MCV: 86.2 fL (ref 80.0–98.0)
MONOCYTES: 6.1 % (ref 1–13)
MPV: 13.6 fL — ABNORMAL HIGH (ref 6.0–10.0)
NEUTROPHILS: 89.8 % — ABNORMAL HIGH (ref 34–64)
NRBC: 0 (ref 0–0)
PLATELET COMMENTS: DECREASED
PLATELET: 109 10*3/uL — ABNORMAL LOW (ref 140–450)
RBC: 3.7 M/uL (ref 3.60–5.20)
RDW-SD: 49.1 — ABNORMAL HIGH (ref 36.4–46.3)
WBC: 17.1 10*3/uL — ABNORMAL HIGH (ref 4.0–11.0)

## 2021-05-17 LAB — NT-PRO BNP: NT pro-BNP: 64 (ref 0–125)

## 2021-05-17 LAB — GLUCOSE, POC
Glucose (POC): 237 mg/dL — ABNORMAL HIGH (ref 65–105)
Glucose (POC): 228 mg/dL — ABNORMAL HIGH (ref 65–105)
Glucose (POC): 223 mg/dL — ABNORMAL HIGH (ref 65–105)

## 2021-05-17 LAB — CULTURE, BLOOD: Blood Culture Result: NO GROWTH

## 2021-05-17 MED ORDER — METHYLPREDNISOLONE (PF) 40 MG/ML IJ SOLR
40 mg/mL | Freq: Two times a day (BID) | INTRAMUSCULAR | Status: DC
Start: 2021-05-17 — End: 2021-05-17

## 2021-05-17 MED ORDER — IPRATROPIUM-ALBUTEROL 2.5 MG-0.5 MG/3 ML NEB SOLUTION
2.5 mg-0.5 mg/3 ml | RESPIRATORY_TRACT | Status: AC
Start: 2021-05-17 — End: 2021-05-20
  Administered 2021-05-18 – 2021-05-20 (×15): via RESPIRATORY_TRACT

## 2021-05-17 MED ORDER — IPRATROPIUM-ALBUTEROL 2.5 MG-0.5 MG/3 ML NEB SOLUTION
2.5 mg-0.5 mg/3 ml | RESPIRATORY_TRACT | Status: DC
Start: 2021-05-17 — End: 2021-05-17

## 2021-05-17 MED ORDER — METHYLPREDNISOLONE (PF) 40 MG/ML IJ SOLR
40 mg/mL | Freq: Four times a day (QID) | INTRAMUSCULAR | Status: DC
Start: 2021-05-17 — End: 2021-05-20
  Administered 2021-05-18 – 2021-05-20 (×12): via INTRAVENOUS

## 2021-05-17 MED ORDER — IPRATROPIUM-ALBUTEROL 2.5 MG-0.5 MG/3 ML NEB SOLUTION
2.5 mg-0.5 mg/3 ml | RESPIRATORY_TRACT | Status: AC
Start: 2021-05-17 — End: 2021-05-17
  Administered 2021-05-17: 22:00:00 via RESPIRATORY_TRACT

## 2021-05-17 MED FILL — DEXMEDETOMIDINE 200 MCG/50 ML (4 MCG/ML) IN 0.9 % SODIUM CHLORIDE IV: 200 mcg/50 mL (4 mcg/mL) | INTRAVENOUS | Qty: 50

## 2021-05-17 MED FILL — LANTUS U-100 INSULIN 100 UNIT/ML SUBCUTANEOUS SOLUTION: 100 unit/mL | SUBCUTANEOUS | Qty: 1

## 2021-05-17 MED FILL — DIPRIVAN 10 MG/ML INTRAVENOUS EMULSION: 10 mg/mL | INTRAVENOUS | Qty: 100

## 2021-05-17 MED FILL — IPRATROPIUM-ALBUTEROL 2.5 MG-0.5 MG/3 ML NEB SOLUTION: 2.5 mg-0.5 mg/3 ml | RESPIRATORY_TRACT | Qty: 3

## 2021-05-17 MED FILL — BUDESONIDE 0.5 MG/2 ML NEB SUSPENSION: 0.5 mg/2 mL | RESPIRATORY_TRACT | Qty: 1

## 2021-05-17 MED FILL — SOLU-MEDROL (PF) 40 MG/ML SOLUTION FOR INJECTION: 40 mg/mL | INTRAMUSCULAR | Qty: 1

## 2021-05-17 MED FILL — SODIUM CHLORIDE 3 % NEB SOLUTION: 3 % | RESPIRATORY_TRACT | Qty: 15

## 2021-05-17 MED FILL — PANTOPRAZOLE 40 MG IV SOLR: 40 mg | INTRAVENOUS | Qty: 40

## 2021-05-17 MED FILL — DOCUSATE SODIUM 50 MG/5 ML ORAL LIQUID: 50 mg/5 mL | ORAL | Qty: 10

## 2021-05-17 MED FILL — MONTELUKAST 10 MG TAB: 10 mg | ORAL | Qty: 1

## 2021-05-17 MED FILL — HEPARIN (PORCINE) 5,000 UNIT/ML IJ SOLN: 5000 unit/mL | INTRAMUSCULAR | Qty: 1

## 2021-05-17 MED FILL — PIPERACILLIN-TAZOBACTAM 3.375 GRAM IV SOLR: 3.375 gram | INTRAVENOUS | Qty: 3.38

## 2021-05-17 NOTE — Progress Notes (Signed)
Peripheral Vascular Lab Duplex : Bilateral Lower Extremity Venous Duplex     1. No evidence of deep vein thrombosis noted in the bilateral lower extremities.     Final report to follow  Shayna Salichs RVT, RDMS

## 2021-05-17 NOTE — Progress Notes (Signed)
[employer] ID #: [id number]  IPAP:  17 cm H2O   EPAP: 7 cm H2O  Rate:   10  FiO2:   30 %

## 2021-05-17 NOTE — Progress Notes (Signed)
Daily Ventilator Progress Note      Name:  [name] [name]  Gender:  F    DOB:  1950-10-05 Age:  70 y.o.    MRN:  [id number]  Admit Date:  05/11/2021      Primary Encounter Diagnosis:       The primary encounter diagnosis was Acute respiratory failure with hypoxia and hypercapnia (HCC). Diagnoses of Leukocytosis, unspecified type, Acute hyperkalemia, and Hypercarbia were also pertinent to this visit.      Active Problem List:     Patient Active Problem List    Diagnosis    Respiratory failure (HCC)    Acute CHF (congestive heart failure) (HCC)    UTI (urinary tract infection)    COPD exacerbation (HCC)    Acute hypoxemic respiratory failure (HCC)    Chronic respiratory failure with hypoxia (HCC)     On 3L O2 chronically.      Headache    Abdominal pain    SIRS (systemic inflammatory response syndrome) (HCC)    Hyperkalemia    Obtunded    Acute renal failure (ARF) (HCC)    Septic shock (HCC)    Metabolic encephalopathy    Dehydration    COPD with acute exacerbation (HCC)    Acute-on-chronic kidney injury (HCC)    Chest pain    Dyspnea    Type 2 diabetes mellitus with diabetic neuropathy (HCC)    Narcotic bowel syndrome (HCC)    Cannabinoid hyperemesis syndrome    Gastritis    Nausea & vomiting    Asthma with acute exacerbation    Lactic acidosis    Leukocytosis    Sepsis (HCC)    Asthma exacerbation    Type 2 diabetes mellitus with nephropathy (HCC)    Sacroiliitis (HCC)    Spondylosis of lumbar region without myelopathy or radiculopathy    Lumbar and sacral osteoarthritis    Chronic pain syndrome    Uncontrolled type 2 diabetes mellitus with hyperglycemia (HCC)    Acute colitis    Acute hyperglycemia    Accelerated hypertension    Severe headache    Osteoarthritis of hips, bilateral    PTSD (post-traumatic stress disorder)    Severe hypertension             LOS: 6 days     ICU LOS:  5d 3h     Vital signs in last 12 hours:   Patient Vitals for the past 12 hrs:   Temp Pulse Resp BP SpO2   05/17/21 0440 -- (!) 57 14 -- 100 %    05/17/21 0400 -- (!) 55 16 126/84 100 %   05/17/21 0300 -- (!) 57 14 (!) 145/91 100 %   05/17/21 0200 -- (!) 55 14 127/82 100 %   05/17/21 0100 -- 63 14 (!) 151/87 100 %   05/17/21 0056 -- -- -- -- 100 %   05/17/21 0053 -- 66 14 -- 100 %   05/17/21 0000 -- 63 13 (!) 147/87 100 %   05/16/21 2300 -- 63 17 (!) 148/90 100 %   05/16/21 2200 -- (!) 58 18 (!) 144/86 100 %   05/16/21 2100 -- (!) 59 16 (!) 148/90 100 %   05/16/21 2000 97.8 F (36.6 C) (!) 52 14 (!) 126/97 100 %   05/16/21 1959 97.8 F (36.6 C) -- -- -- --   05/16/21 1957 -- -- -- -- 100 %   05/16/21 1947 -- (!) 54 13 -- 100 %  05/16/21 1900 -- 78 17 (!) 143/105 96 %   05/16/21 1800 -- (!) 51 14 118/83 100 %   05/16/21 1700 -- (!) 53 14 105/78 100 %          ABG Results (Last 7):     Recent Labs     05/15/21  0913 05/14/21  1449 05/14/21  1257 05/13/21  0547 05/12/21  1657 05/12/21  0516 05/12/21  0104   PH 7.358 7.569* 7.477* 7.481* 7.470* 7.407 7.282*   PCO2 55.0* 40.6 50.6* 51.6* 54.4* 64.4* 95.3*   PO2 93.0 61.0* 62.0* 89.0 51.0* 66.0* 169.0*   HCO3 30.9* 37.1* 37.4* 38.5* 39.5* 40.5* 45.2*   BE 5* 15* 14* 15* 16* 16* 18*   O2ST 97.0 94.0 92.0 97.0 86.0* 92.0 99.0   SITE L Radial L Radial L Radial R Radial R Radial L Radial L Radial   SAMPLE Art Art Art Art Art Art Art   FIO2 35 35 30 60 40 60 100   HDEV Vent Vent Vent Vent Vent Vent Vent            ARDS is classified according to the degree of hypoxemia (PaO2/FiO2 ratio),  mild (PaO2/FiO2, 201-300), moderate (PaO2/FiO2, 101-200), and severe (PaO2/FiO2???100)       Airway:     Airway - Continuous Aspiration of Subglottic Secretions (CASS) Tube 05/11/21 Oral (Active)   Placement Date/Time: 05/11/21 2315   Number of Attempts: 1  Inserted By: DR Marinda Elk  Present on Admission/Arrival: No  Location: Oral  Placement Verified: (c) Chest x-ray;BBS;Auscultation;EtCO2;Placement not verified (comment)  Airway Types: En...   Number of days: 5       Airway - Endotracheal Tube 05/11/21 Oral (Active)    Placement Date/Time: 05/11/21 2315   Number of Attempts: 1  Inserted By: Sunday Spillers, MD  Present on Admission/Arrival: Yes  Location: Oral  Placement Verified: Auscultation;BBS;Chest x-ray;EtCO2  Airway Types: Endotracheal, cuffed  Airway Tube Si...   Number of days: 5       Airway - Continuous Aspiration of Subglottic Secretions (CASS) Tube 05/11/21 Oral (Active)   Insertion Depth (cm) 26 cm 05/17/21 0440   Line Mark Lips 05/17/21 0440   Side Secured Centered 05/17/21 0440   Cuff Pressure 26 cmH20 05/17/21 0440   Site Assessment Clean, dry, & intact 05/17/21 0440   Suction on Yes 05/17/21 0440   Amt Secretions Aspirated (mL) 1 mL 05/16/21 1537       Airway - Endotracheal Tube 05/11/21 Oral (Active)   Insertion Depth (cm) 24 cm 05/15/21 2000   Line Mark Lips 05/15/21 2000   Side Secured Device;Left 05/14/21 1946   Cuff Pressure 28 cmH20 05/14/21 1625   Site Assessment Clean, dry, & intact 05/15/21 2000         Ventilator Settings:   Ventilator Mode: Other (comment)  FIO2 (%): 30 %, SpO2/FIO2 Ratio: 333.33, CMV Rate Set: 14, Back-Up Rate: 14, Vt Set (ml): 400 ml, PEEP/VENT (cm H2O): 5 cm H20, I:E Ratio: 1:2.6, Insp Time (sec): 1.2 sec, Insp Flow (l/min): 45.1 l/min, Insp Rise Time %: 28 %, Flow Trigger: 3, Exp Time (sec): 3.09 sec, Exp Flow (l/min): 31.5 l/sec     Vt Set (ml): 400 ml Vt Exhaled (Machine Breath) (ml): 461 ml       Ventilator Pressures PIP Observed (cm H2O): 22 cm H2O, Plateau Pressure (cm H2O): 18 cm H2O, MAP (cm H2O): 9.1, PEEP/VENT (cm H2O): 5 cm H20  Safety & Alarms Circuit Temperature: 98.6 F (37 C), Backup Mode Checked/Apnea: Yes, Pressure Max: 45 cm H2O, Pressure Min: 10 cm H2O, Ve Min: 2, Ve Max: 20, Vt Min: 180 ml, Vt Max: 1000 ml, RR Min: 8, RR Max: 40       Breath Sounds:   Breath Sounds Bilateral: Diminished     Secretions:     Suction: ET Tube  Suction Device: Inline suction catheter  Sputum Method Obtained: Endotracheal  Sputum Amount: Moderate  Sputum Color/Odor:  White  Sputum Consistency: Thick     Chest X-Ray:     CXR Results  (Last 48 hours)                 05/17/21 0350  XR CHEST SNGL V Final result    Impression:  IMPRESSION:   1. Stable support tubes.   2. Stable left mid chest infiltrate and left mid chest nodule.   3. Left upper lobe bullous emphysema.       Electronically signed by: Earline Mayotte, MD 05/17/2021 4:21 AM EST           Narrative:  Exam: AP portable chest       Clinical indication: respiratory failure - ICU patient       Comparison: 05/14/2021;         Results:  Endotracheal tube and nasogastric tube in customary position.   No pneumothorax. Bullous emphysema left upper lobe.   Left mid chest nodule.   Stable infiltrate left mid chest. Bibasilar discoid atelectasis versus scarring.       Heart normal.  Mediastinal contours are normal.        No free air is seen under the hemidiaphragms.   Osseous structures intact.                    XR Results (most recent):  Results from Hospital Encounter encounter on 05/11/21    XR CHEST SNGL V    Narrative  Exam: AP portable chest    Clinical indication: respiratory failure - ICU patient    Comparison: 05/14/2021;    Results:  Endotracheal tube and nasogastric tube in customary position.  No pneumothorax. Bullous emphysema left upper lobe.  Left mid chest nodule.  Stable infiltrate left mid chest. Bibasilar discoid atelectasis versus scarring.    Heart normal.  Mediastinal contours are normal.    No free air is seen under the hemidiaphragms.   Osseous structures intact.    Impression  IMPRESSION:  1. Stable support tubes.  2. Stable left mid chest infiltrate and left mid chest nodule.  3. Left upper lobe bullous emphysema.    Electronically signed by: Earline Mayotte, MD 05/17/2021 4:21 AM EST        Weaning Trial:   Spontaneous Breathing Trial Complete: Yes (PS: 8) (05/16/21 0744)  RASS  [place] Agitation Sedation Scale (RASS): Light sedation      Weaning Parameters:    Resp Rate Observed: 13 (05/16/21 0744)    Ve: 4  VT: 422   RSBI: 31  NIF: -38  PEF: 20   VC: 473       Summary:   Patient is on the vent with sedation, she is alert and awake no respiratory distress noted at this time, she has moderate thick tan white secretion via ett and oral. She has a cough with suction. ?            Plan:     Continue to monitor patient  on APVcmv on the venilator    Electronically Signed By: Ether Griffins, RT     May 17, 2021

## 2021-05-17 NOTE — Progress Notes (Signed)
Currently NPO, @1955  prev BS 109. Spoke with PA . Ok to give Lantus 10u instead of glucommander 28u.

## 2021-05-17 NOTE — Progress Notes (Signed)
Problem: Ventilator Management  Goal: *Adequate oxygenation and ventilation  Outcome: Progressing Towards Goal  Goal: *Patient maintains clear airway/free of aspiration  Outcome: Progressing Towards Goal  Goal: *Absence of infection signs and symptoms  Outcome: Progressing Towards Goal  Goal: *Normal spontaneous ventilation  Outcome: Progressing Towards Goal     Problem: Patient Education: Go to Patient Education Activity  Goal: Patient/Family Education  Outcome: Progressing Towards Goal

## 2021-05-17 NOTE — Progress Notes (Signed)
Progress Notes by Salley Slaughter, MD at 05/17/21 1058                Author: Salley Slaughter, MD  Service: Hospitalist  Author Type: Physician       Filed: 05/17/21 1455  Date of Service: 05/17/21 1058  Status: Signed          Editor: Salley Slaughter, MD (Physician)                          INTERNAL MEDICINE PROGRESS NOTE      Date of note:      May 17, 2021      Patient:               [name] [name], 70 y.o., female   Admit Date:        05/11/2021   Length of Stay:  6 day(s)      Problem List:      Patient Active Problem List        Diagnosis  Code         ?  Severe hypertension  I10     ?  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  PTSD (post-traumatic stress disorder)  F43.10     ?  Severe headache  R51.9     ?  Accelerated hypertension  I10     ?  Acute colitis  K52.9     ?  Acute hyperglycemia  R73.9     ?  Uncontrolled type 2 diabetes mellitus with hyperglycemia (HCC)  E11.65     ?  Spondylosis of lumbar region without myelopathy or radiculopathy  M47.816     ?  Lumbar and sacral osteoarthritis  M47.817     ?  Chronic pain syndrome  G89.4     ?  Sacroiliitis (HCC)  M46.1     ?  Type 2 diabetes mellitus with nephropathy (HCC)  E11.21     ?  Lactic acidosis  E87.20     ?  Leukocytosis  D72.829     ?  Sepsis (HCC)  A41.9     ?  Asthma exacerbation  J45.901     ?  Asthma with acute exacerbation  J45.901     ?  Gastritis  K29.70     ?  Nausea & vomiting  R11.2     ?  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  Cannabinoid hyperemesis syndrome  R11.2, F12.90     ?  Type 2 diabetes mellitus with diabetic neuropathy (HCC)  E11.40     ?  Chest pain  R07.9     ?  Dyspnea  R06.00     ?  Dehydration  E86.0     ?  COPD with acute exacerbation (HCC)  J44.1     ?  Acute-on-chronic kidney injury (HCC)  N17.9, N18.9     ?  Hyperkalemia  E87.5     ?  Obtunded  R40.1     ?  Acute renal failure (ARF) (HCC)  N17.9     ?  Septic shock (HCC)  A41.9, R65.21     ?  Metabolic encephalopathy  G93.41      ?  SIRS (systemic inflammatory response syndrome) (HCC)  R65.10     ?  Abdominal pain  R10.9     ?  Headache  R51.9     ?  Chronic respiratory failure with hypoxia (HCC)  J96.11     ?  COPD exacerbation (HCC)  J44.1     ?  Acute hypoxemic respiratory failure (HCC)  J96.01     ?  UTI (urinary tract infection)  N39.0     ?  Acute CHF (congestive heart failure) (HCC)  I50.9         ?  Respiratory failure (HCC)  J96.90             Subjective + interval history        Extubated today. Following commands. Denies pain         Assessment :        Acute on chronic hypoxic hypercapnic respiratory failure requiring intubation on 05/11/21   Community-acquired pneumonia with left mucous plugging/atelectasis, s/p bronchoscopy with 05/16/21-mild secretions, irritation left airways,  no mucous plug, dynamic collapse Sup segt LLL   Blood cultures 1 out of 2 positive for Corynebacterium, likely procurement contamination   Hemoptysis secondary to pneumonia   Acute COPD exacerbation - on 3L supplemental oxygen at baseline   Bullous emphysema   Hyperkalemia   Leukocytosis,    Bullous emphysema  Hypertension  Dyslipidemia  Type 2 diabetes mellitus  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  Continue Singulair 10 mg daily.  Continue Zosyn.  Follow bronchoscopy cultures.  SLP eval           DVT ppx : Subcutaneous heparin  GI prophylaxis-Protonix      Discussed with  : husband at bedside and RN      - Code status: full code      Recommend to continue hospitalization.   Expected date of discharge: tbd   Plan for disposition - tbd        Objective:           Visit Vitals      BP  (!) 140/88     Pulse  65      Temp  98.2 ??F (36.8 ??C)     Resp  14     Ht   (1.702 m)     Wt  95.7 kg (210 lb 15.7 oz)     SpO2  100%        BMI  33.04 kg/m??                 Intake/Output Summary (Last 24 hours) at 05/17/2021 1058   Last data filed at 05/17/2021 0700     Gross per 24 hour        Intake  1866.18 ml        Output  1225 ml        Net  641.18 ml             Physical Exam:        GEN -alert, oriented to self and place, not in distress   HEENT - mucous membranes moist   Neck - supple, no JVD   Cardiac - RRR, S1, S2, no murmurs   Chest/Lungs - mild expiratory wheezing   Abdomen -  soft, obese, non tender   Extremities - no clubbing/ cyanosis/ edema   Neuro -follows commands, moves all 4 extremities   Skin - no rashes or lesions        Current medications:           Current Facility-Administered Medications:    ?  methylPREDNISolone (PF) (SOLU-MEDROL) injection 40 mg, 40 mg, IntraVENous, Q12H, Faythe Ghee C, MD   ?  fentaNYL citrate (PF) injection 25 mcg, 25 mcg, IntraVENous, Q1H PRN, Imad, Melhem, MD, 50 mcg at 05/16/21 1007   ?  NOREPINephrine (LEVOPHED) 16,000 mcg in dextrose 5% 250 mL infusion, 0.5-16 mcg/min, IntraVENous, TITRATE, Tiffany Kocher, MD, Held at 05/14/21  2151   ?  heparin (porcine) injection 5,000 Units, 5,000 Units, SubCUTAneous, Q8H, Imad, Melhem, MD, 5,000 Units at 05/17/21 0531   ?  sodium chloride 3% hypertonic nebulizer soln, 4 mL, Nebulization, TID RT, Imad, Melhem, MD, 4 mL at 05/17/21 0751   ?  dexmedeTOMidine in 0.9 % NaCl (PRECEDEX) 200 mcg/50 mL (4 mcg/mL) infusion soln, 0.1-1.5 mcg/kg/hr, IntraVENous, TITRATE, Gar Gibbon  C, NP, Last Rate: 9.4 mL/hr at 05/17/21 1011, 0.4 mcg/kg/hr at 05/17/21 1011   ?  hydrALAZINE (APRESOLINE) 20 mg/mL injection 20 mg, 20 mg, IntraVENous, Q6H PRN, Lottie Dawson, NP, 20 mg at 05/14/21 6045   ?  propofol (DIPRIVAN) 10 mg/mL infusion, 0-50 mcg/kg/min, IntraVENous, TITRATE, Zeb Rawl S, MD, Stopped at 05/17/21 0730   ?  pantoprazole (PROTONIX) 40 mg in  0.9% sodium chloride 10 mL injection, 40 mg, IntraVENous, DAILY, Gar Gibbon C, NP, 40 mg at 05/17/21  1013   ?  dextrose (D50W) injection syrg 5-25 g, 10-50 mL, IntraVENous, PRN, Lottie Dawson, NP   ?  glucagon (GLUCAGEN) injection 1 mg, 1 mg, IntraMUSCular, PRN, Lottie Dawson, NP   ?  insulin glargine (LANTUS) injection 1-100 Units, 1-100 Units, SubCUTAneous, QHS, Lottie Dawson, NP, 23 Units at 05/16/21 2121   ?  insulin lispro (HUMALOG) injection 1-100 Units, 1-100 Units, SubCUTAneous, Q6H, Lottie Dawson, NP, 2 Units at 05/17/21 0532   ?  docusate (COLACE) 50 mg/5 mL oral liquid 100 mg, 100 mg, Oral, BID, Imad, Melhem, MD, 100 mg at 05/16/21 2117   ?  piperacillin-tazobactam (ZOSYN) 3.375 g in 0.9% sodium chloride (MBP/ADV) 100 mL MBP, 3.375 g, IntraVENous, Q8H, Erling Conte, MD, Last  Rate: 25 mL/hr at 05/17/21 1013, 3.375 g at 05/17/21 1013   ?  montelukast (SINGULAIR) tablet 10 mg, 10 mg, Oral, QHS, Lauralee Evener, PA-C, 10 mg at 05/16/21 2117   ?  dextrose (D50W) injection syrg 10-15 g, 20-30 mL, IntraVENous, PRN, Lauralee Evener, PA-C   ?  white petrolatum-mineral oiL (LACRILUBE S.O.P.) ointment 1 Each, 1 Each, Both Eyes, DAILY, Erling Conte, MD, 1 Each at 05/16/21 [phone]   ?  PHOSPHATE ELECTROLYTE REPLACEMENT PROTOCOL STANDARD DOSING, 1 Each, Other, PRN, Coulter, Venetia Night, NP   ?  MAGNESIUM ELECTROLYTE REPLACEMENT PROTOCOL STANDARD DOSING, 1 Each, Other, PRN, Coulter, Venetia Night, NP   ?  POTASSIUM ELECTROLYTE REPLACEMENT PROTOCOL STANDARD DOSING, 1 Each, Other, PRN, Coulter, Venetia Night, NP   ?  CALCIUM ELECTROLYTE REPLACEMENT PROTOCOL STANDARD DOSING, 1 Each, Other, PRN, Coulter, Venetia Night, NP, 1 Each at 05/13/21 1400        Labs:          Recent Results (from the past 24 hour(s))     GLUCOSE, POC  Collection Time: 05/16/21 11:55 AM         Result  Value  Ref Range            Glucose (POC)  290 (H)  65 - 105 mg/dL       POTASSIUM          Collection Time: 05/16/21  1:38 PM          Result  Value  Ref Range            Potassium  3.9  3.5 - 5.1 mEq/L       POTASSIUM          Collection Time: 05/16/21  4:07 PM         Result  Value  Ref Range            Potassium  3.9  3.5 - 5.1 mEq/L       GLUCOSE, POC          Collection Time: 05/16/21  5:34 PM         Result  Value  Ref Range            Glucose (POC)  275 (H)  65 - 105 mg/dL       GLUCOSE, POC          Collection Time: 05/16/21 11:37 PM         Result  Value  Ref Range            Glucose (POC)  237 (H)  65 - 105 mg/dL       METABOLIC PANEL, BASIC          Collection Time: 05/17/21  4:33 AM         Result  Value  Ref Range            Potassium  4.2  3.5 - 5.1 mEq/L       Chloride  105  98 - 107 mEq/L       Sodium  141  136 - 145 mEq/L       CO2  24  20 - 31 mEq/L       Glucose  238 (H)  74 - 106 mg/dl       BUN  20  9 - 23 mg/dl       Creatinine  1.61  0.55 - 1.02 mg/dl       GFR est AA  >09.6          GFR est non-AA  >60          Calcium  9.0  8.7 - 10.4 mg/dl       Anion gap  12  5 - 15 mmol/L       MAGNESIUM          Collection Time: 05/17/21  4:33 AM         Result  Value  Ref Range            Magnesium  1.8  1.6 - 2.6 mg/dL       NT-PRO BNP          Collection Time: 05/17/21  4:33 AM         Result  Value  Ref Range            NT pro-BNP  64  0 - 125         PHOSPHORUS          Collection Time: 05/17/21  4:33 AM  Result  Value  Ref Range            Phosphorus  3.3  2.4 - 5.1 mg/dL       GLUCOSE, POC          Collection Time: 05/17/21  5:05 AM         Result  Value  Ref Range            Glucose (POC)  228 (H)  65 - 105 mg/dL       POC BLOOD GAS + LACTIC ACID          Collection Time: 05/17/21  8:45 AM         Result  Value  Ref Range            pH  7.377  7.350 - 7.450         PCO2  52.4 (H)  35.0 - 45.0 mm Hg       PO2  95.0  75 - 100 mm Hg       BICARBONATE  30.7 (HH)  18.0 - 26.0 mmol/L       O2 SAT  97.0  90 - 100 %            CO2, TOTAL  32.0 (H)  24 - 29 mmol/L            Lactic Acid  0.55  0.40 - 2.00 mmol/L       BASE  EXCESS  6 (H)  -2 - 3 mmol/L       Sample type  Art          FIO2  30          SITE  L Radial          DEVICE  Vent          ALLENS TEST  Pass          MODE  SPONT          PEEP/CPAP  5               PRESSURE SUPPORT  8              XR Results:   Results from Hospital Encounter encounter on 05/11/21      XR CHEST SNGL V      Narrative   Exam: AP portable chest      Clinical indication: respiratory failure - ICU patient      Comparison: 05/14/2021;      Results:  Endotracheal tube and nasogastric tube in customary position.   No pneumothorax. Bullous emphysema left upper lobe.   Left mid chest nodule.   Stable infiltrate left mid chest. Bibasilar discoid atelectasis versus scarring.      Heart normal.  Mediastinal contours are normal.      No free air is seen under the hemidiaphragms.   Osseous structures intact.      Impression   IMPRESSION:   1. Stable support tubes.   2. Stable left mid chest infiltrate and left mid chest nodule.   3. Left upper lobe bullous emphysema.      Electronically signed by: Earline Mayotte, MD 05/17/2021 4:21 AM EST         CT Results:   Results from Hospital Encounter encounter on 05/11/21      CT CHEST W CON PE PROTOCOL      Narrative   All CT exams at this facility use one or more  dose reduction techniques   including automatic exposure control, mA/kV adjustment per patient's size, or   iterative reconstruction technique.      Clinical History: rule out PE.      Examination:   CTA CHEST with contrast. 3 mm spiral scanning was performed from the lung apices   to the upper pole of the kidneys. 3D MIP coronal and sagittal reconstruction   imaging has been performed.      Correlation:   08/30/2018, 02/18/2018 and 07/03/2015      Findings:      Degenerative changes of the spine. Secretions dependent portions of the trachea.   Dense airspace opacity with air bronchograms left upper lobe. There are   bilateral bullae. Dependent atelectasis lung bases.      2 cm left lower lobe nodule slightly  increased in size from 08/30/2018, 02/18/2018   and 07/02/2025 ((prior measurement 1.7, 1.9 and 1.3 cm). No dissection thoracic   aorta. Ascending aorta measures 4.7 cm unchanged from prior studies. The   esophagus is not dilated.      No lymph node enlargement in the axilla, mediastinum or hila. No pulmonary   embolism. Visualized portions of the liver, pancreas, adrenal glands are   unremarkable. 2.3 cm splenic lesion unchanged from 02/18/2018. 1.8 cm midpole   right renal hypodensity, ultrasound characterization suggested.      Impression   IMPRESSION:   1. No pulmonary embolism.   2. Bilateral bullae   3. Left upper lobe pneumonia/volume loss. Radiographic follow-up to resolution   suggested.   4. 2 cm left lower lobe nodule not significantly changed from prior studies.   5. 2.3 cm stable splenic lesion, unchanged from 02/18/2018.   6. 1.8 cm probable cyst midpole right kidney, ultrasound characterization   suggested.      Electronically signed by: Jolayne Panther, MD 05/11/2021 10:11 PM EST               VAS/US Results:   Results from Hospital Encounter encounter on 05/11/21      DUPLEX UPPER EXT VENOUS LEFT      Narrative   [id number]   [id number]   [id number]   Study ID: [id number]      [facility]   [address] [place],   [place]   [address]      Upper Extremity Venous Duplex Report      Name: TONEISHA, SAVARY Date: 05/12/2021 08:42 AM   MRN: [id number]                   Patient Location: [id number]   DOB: [birth year]               Age: 84 yrs   Gender: Female                Account #: [id number]   Reason For Study: History of DVT. ICD-10 Z86.71   Ordering Physician: Otilio Carpen   Referring Physician: [name]   Performed By: Kathrin Ruddy, RVT      Interpretation Summary   1. No evidence of deep venous thrombosis in the left upper extremity.   2. No evidence of deep venous thrombosis in the contralateral/ right   internal jugular and subclavian veins.    3. Compared to previous exam done on 01/24/2021, the previously documented   subclavian deep vein thrombosis has now  resolved.   ___________________________________________________________________________   RIGHT ARM   The right internal jugular and subclavian veins were patent and   compressible, no evidence of intraluminal thrombus. Spectral Doppler exam   demonstrates normal venous flow in the right jugular vein. Spectral   Doppler exam demonstrates normal venous flow in the right subclavian vein.      LEFT ARM   The left upper extremity veins were examined with duplex ultrasound. The   internal jugular, subclavian, axillary and brachial veins were patent and   compressible with no intraluminal thrombus identified. Spontaneous,   pulsatile flow with normal augmentation was noted throughout the left   upper extremity.      LEFT SUPERFICIAL VEINS   The cephalic and basilic veins were patent and compressible with no   evidence of intraluminal thrombus identified.      SONOGRAPHERS COMMENTS   Technically difficult and suboptimal due to positioning and patients   inability to cooperate.      Electronically signed byDr. Charlane Ferretti, M.D.   05/12/2021 04:30 PM            Total clinical care time was 39  minutes of which more than 50% was spent in coordination of care and counseling (time spent with patient/family face to face, physical exam, reviewing laboratory and imaging investigations, speaking with physicians and  nursing staff involved in this patient's care).       Dragon medical dictation system was used for part of this note. Unintentional voice recognition errors may occur      Tia Masker,  MD   [facility]   May 17, 2021   Time: 10:59 AM

## 2021-05-17 NOTE — Progress Notes (Signed)
Bedside shift change report given to Bee, Charity fundraiser (Cabin crew) by Clydie Braun, RN (offgoing nurse). Report included the following information SBAR, Kardex, Intake/Output, MAR, Accordion, Recent Results, Med Rec Status, Cardiac Rhythm NSR, Alarm Parameters , and Quality Measures.

## 2021-05-17 NOTE — Progress Notes (Signed)
PT placed on PSV, settings as charted, Per. Dr. Grayland Jack

## 2021-05-17 NOTE — Progress Notes (Signed)
 NUTRITION RECOMMENDATIONS:   Recommend advancing to [facility] diet as medically able. Consider SLP consult if concerned for aspiration risks.   Discontinue tube feed orders, pt without enteral access.   BG control <180 mg/dL throughout admit.   Weekly wts to trend.   Discharge recommendations: Pending clinical course    NUTRITION FOLLOW UP    Current Diet Order: DIET NPO  ADULT TUBE FEEDING Orogastric; Peptide Based High Protein; Delivery Method: Continuous; Continuous Initial Rate (mL/hr): 20; Continuous Advance Tube Feeding: Yes; Advancement Volume (mL/hr): 15; Advancement Frequency: Q 6 hours; Continuous Goal Ra...    Current Intake: [x]  N/A- NPO. TFs held at time of visit, pt extubated later this morning.     Nutrition intake per I/O: Total daily TF provision on average of 494 ml x last 4 days per I/O. Provides 494 kcal (37%), 43 g protein (47%). Total daily Propofol provision on average of 392 ml x last 4 days providing additional 431 lipid kcal per day.    Pertinent Medications: colace, glucomander, methylprednisolone , protonix , precedex    Pertinent Labs: Reviewed.     Weight: Initial admit wt of 94.2 kg (12/1). Lowest BW this admit. Pt currently +1.6 L per I/O.   Last 3 Recorded Weights in this Encounter    05/14/21 0246 05/15/21 0530 05/16/21 0313   Weight: 95.8 kg (211 lb 3.2 oz) 95.3 kg (210 lb 1.6 oz) 95.7 kg (210 lb 15.7 oz)     BMI: Body mass index is 33.04 kg/m.    Estimated Daily Nutrition Needs: 94.2 kg admit wt, 61.6 kg IBW  1884-2355 kcal (20-25 kcal/kg)  92 - 123 g protein (1.5-2 g/kg IBW)  1880 mL fluid (20 ml/kg or per MD)    Physical Assessment:  GI Symptoms:   +OGT 11/30, removed 12/5  Last Bowel Movement Date: 05/17/21  Stool Appearance: Formed  Abdominal Assessment: Intact, Obese  Bowel Sounds: Active   Chewing/Swallowing Issues: Intubated 11/29, extubated 12/5  Skin Integrity: Intact per flowsheets  Fluid Accumulation:   Edema  Generalized: Trace  LUE: 1+  LLE: 1+  RUE: 1+  RLE:  1+    Physical Assessment: No overt s/sx of muscle or fat wasting.     Assessment of Current MNT: TF is adequate if intake averages >75% of estimated needs and consistently running @ goal.    Nutrition Diagnosis:     Inadequate oral intake related to acute hypoxic hypercapnic respiratory failure s/p extubation as evidenced by pt NPO.     Altered nutrition related labs related to endocrine dysfunction as evidenced by elevated blood glucose, hemoglobin A1c of 6.5% on 04/03/2021.    Nutrition Recommendation:   Recommend advancing to [facility] diet as medically able. Consider SLP consult if concerned for aspiration risks.   Discontinue tube feed orders, pt without enteral access.   BG control <180 mg/dL throughout admit.   Weekly wts to trend.   Discharge recommendations: Pending clinical course    Monitoring and Evaluation: Wt trend, nutrition support status, nutrition-related labs, and s/sx of new skin concerns; will f/u per policy.    Nutrition Goals: Pt to meet/tolerate >75% of estimated needs, maintain weight throughout LOS, BG control <180, maintain skin integrity.    Nutrition Level of Care:  []  Low     []  Moderate     [x]  High    Progress Towards Nutrition Goals: []   Met/Ongoing     []   Progressing Appropriately     [x]   Progressing Slowly     []   Not Progressing    Code Status: Full Code      Erlinda Laurence, RD  05/17/21   Pager: [phone]  Office: [phone]

## 2021-05-17 NOTE — Progress Notes (Signed)
PT extubated tol well, and placed on Bipap settings as charted, per Dr. Grayland Jack, tol well

## 2021-05-17 NOTE — Progress Notes (Signed)
Progress Notes by Zoe Lan, MD at 05/17/21 [phone]                Author: Zoe Lan, MD  Service: Pulmonary Disease  Author Type: Physician       Filed: 05/17/21 0806  Date of Service: 05/17/21 0747  Status: Signed          Editor: Zoe Lan, MD (Physician)                              [facility] PULMONARY AND CRITICAL CARE MEDICINE           Progress Note         Name:  [name] [name]        DOB:  1950-10-08     MRN:  [id number]        Date:  05/17/2021      [x]  I have reviewed the flowsheet and previous days notes. Events, vitals, medications and notes from last 24 hours reviewed.         ASSESSMENT:     - Acute hypoxic hypercapnic respiratory failure requiring intubation (on 05/11/2021) likely secondary to pneumonia - h/o intubation in the past.    -CAP with L mucus plugging / atelectasis.  H/o same in 2020 on CT.   s/p full bronchoscopy 05/16/21 -> mild secretions / irritation L airways/ no mucus plug - dynamic collapse Sup segt LLL.  Leukocytosis  resolved.  Sputum cultures 05/12/2021 negative.  Bronch cultures and cytology 05/16/2021 pending.   -1 out of 2 blood cultures positive for Corynebacterium species on 05/11/2021.  Likely contamination.  MRSA swab negative.   -Hemoptysis secondary to pneumonia.  Resolved.   - Acute COPD exacerbation - with chronic COPD despite no smoking hx - followed previously by Dr. 05/13/2021, now Dr. Helene Shoe.  on Advair, Spiriva and Singulair at home.   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    - Hx Lung nodule - 05/11/21 CTA Chest with LLL nodule (2 cm) - Chronic stable s/p CT guided bx in 2017   - Hx HTN - Clonidine 0.2 mg BID, Losartan 100 mg daily, Diltiazem CD 240 mg daily   - Hx HLD - Crestor 5 mg daily   - Hx DMII   - Hx Thoracic ascending aortic aneurysm   - Hx Hepatitis C   - Hx LUE/Subclavian DVT in 01/2021- Eliquis.  Resolved on PVL 05/12/2021.  Eliquis held due to mild hemoptysis.   - Hx PTSD   - Hx Chronic pain           PLAN:         - Currently on mechanical ventilator.   Currently tolerating spontaneous breathing trials.  Likely extubation today if tolerates spontaneous breathing trials.    Wean FiO2 to keep SpO2 > 88%.     - -Sedation: precedex and off propofol this am.    - Continue Duonebs q6, hypertonic Saline nebs BID x 6 doses, Singulair 10 mg daily and decrease Solumedrol 40 mg IV q12.  DC Pulmicort   - Goal MAP > 65 and SBP 100-140.   - Continue Zosyn .  S/p Vancomycin. bronch Cultures pending. .      - Avoid nephrotoxins. Renally dose medications. Strict I/Os. Daily weights.    - Glucommander per protocol. Goal BS 140-180   - SUP: Protonix 40  mg IV daily   - DVT prophylaxis: SCDs and SQ Heparin   -Discussed with RT, RN, patient.  CC time 35 minutes.                Subjective:    Off sedation and is on low-dose Precedex.  Comfortable and following commands.  Tolerating spontaneous breathing trials this morning.  Moderate clear tracheal secretions.  No more hemoptysis.      ROS:    Unable to obtain due to patient's condition.             Allergy:     Allergies        Allergen  Reactions         ?  Chocolate [Cocoa]  Sneezing             Confirms allergy but reports "I still eat it"            Vital Signs:       Visit Vitals   BP  128/86      Pulse  (!) 48      Temp  98.2 ??F (36.8 ??C)      Resp  14      Ht   (1.702 m)      Wt  95.7 kg (210 lb 15.7 oz)      SpO2  100%      BMI  33.04 kg/m??                O2 Device: Endotracheal tube, Heated, Ventilator     O2 Flow Rate (L/min): 3 l/min       Temp (24hrs), Avg:98.1 ??F (36.7 ??C), Min:97.8 ??F (36.6 ??C), Max:98.6 ??F (37 ??C)        Patient Vitals for the past 8 hrs:            Temp  Pulse  Resp  BP  SpO2            05/17/21 0700  --  (!) 48  14  128/86  100 %            05/17/21 0600  --  (!) 52  14  132/81  100 %     05/17/21 0500  --  (!) 57  16  128/81  100 %     05/17/21 0456  98.2 ??F (36.8 ??C)  --  --  --  --     05/17/21 0440  --  (!) 57  14  --  100 %      05/17/21 0400  --  (!) 55  16  126/84  100 %     05/17/21 0300  --  (!) 57  14  (!) 145/91  100 %     05/17/21 0200  --  (!) 55  14  127/82  100 %     05/17/21 0100  --  63  14  (!) 151/87  100 %     05/17/21 0056  --  --  --  --  100 %     05/17/21 0053  --  66  14  --  100 %            05/17/21 0000  --  63  13  (!) 147/87  100 %           Intake/Output:    Last shift:      No intake/output data recorded.   Last 3 shifts: 12/03 1901 -  12/05 0700   In: 3869.1 [I.V.:1439.1]   Out: 2775 [Urine:2775]      Intake/Output Summary (Last 24 hours) at 05/17/2021 0748   Last data filed at 05/17/2021 0700     Gross per 24 hour        Intake  2140.74 ml        Output  1725 ml        Net  415.74 ml           Ventilator Settings:          Mode  Rate  Tidal Volume  Pressure  FiO2  PEEP            Other (comment)     400 ml   8 cm H2O  30 %  5 cm H20            Physical Exam:      GENERAL: As above.Marland Kitchen    NECK: Supple. Trachea midline.    RESPIRATORY: Bilateral BS present, decreased at bases. No rales or rhonchi.    CARDIOVASCULAR: S1 and S2 present, regular. No murmur, rub, or thrill.    ABDOMEN: Soft and nontender with positive bowel sounds. No organomegaly.    NEUROLOGICAL: As above.     EXT: No edema or cyanosis.          DATA:      Current Facility-Administered Medications          Medication  Dose  Route  Frequency           ?  fentaNYL citrate (PF) injection 25 mcg   25 mcg  IntraVENous  Q1H PRN     ?  NOREPINephrine (LEVOPHED) 16,000 mcg in dextrose 5% 250 mL infusion   0.5-16 mcg/min  IntraVENous  TITRATE     ?  heparin (porcine) injection 5,000 Units   5,000 Units  SubCUTAneous  Q8H     ?  sodium chloride 3% hypertonic nebulizer soln   4 mL  Nebulization  TID RT     ?  dexmedeTOMidine in 0.9 % NaCl (PRECEDEX) 200 mcg/50 mL (4 mcg/mL) infusion soln   0.1-1.5 mcg/kg/hr  IntraVENous  TITRATE     ?  hydrALAZINE (APRESOLINE) 20 mg/mL injection 20 mg   20 mg  IntraVENous  Q6H PRN           ?  propofol (DIPRIVAN) 10 mg/mL infusion    0-50 mcg/kg/min  IntraVENous  TITRATE           ?  pantoprazole (PROTONIX) 40 mg in 0.9% sodium chloride 10 mL injection   40 mg  IntraVENous  DAILY     ?  dextrose (D50W) injection syrg 5-25 g   10-50 mL  IntraVENous  PRN     ?  glucagon (GLUCAGEN) injection 1 mg   1 mg  IntraMUSCular  PRN     ?  insulin glargine (LANTUS) injection 1-100 Units   1-100 Units  SubCUTAneous  QHS     ?  insulin lispro (HUMALOG) injection 1-100 Units   1-100 Units  SubCUTAneous  Q6H     ?  docusate (COLACE) 50 mg/5 mL oral liquid 100 mg   100 mg  Oral  BID     ?  piperacillin-tazobactam (ZOSYN) 3.375 g in 0.9% sodium chloride (MBP/ADV) 100 mL MBP   3.375 g  IntraVENous  Q8H     ?  budesonide (PULMICORT) 500 mcg/2 ml nebulizer suspension   500 mcg  Nebulization  BID RT     ?  methylPREDNISolone (PF) (SOLU-MEDROL) injection 40 mg   40 mg  IntraVENous  Q8H     ?  montelukast (SINGULAIR) tablet 10 mg   10 mg  Oral  QHS     ?  dextrose (D50W) injection syrg 10-15 g   20-30 mL  IntraVENous  PRN     ?  white petrolatum-mineral oiL (LACRILUBE S.O.P.) ointment 1 Each   1 Each  Both Eyes  DAILY     ?  PHOSPHATE ELECTROLYTE REPLACEMENT PROTOCOL STANDARD DOSING   1 Each  Other  PRN     ?  MAGNESIUM ELECTROLYTE REPLACEMENT PROTOCOL STANDARD DOSING   1 Each  Other  PRN           ?  POTASSIUM ELECTROLYTE REPLACEMENT PROTOCOL STANDARD DOSING   1 Each  Other  PRN           ?  CALCIUM ELECTROLYTE REPLACEMENT PROTOCOL STANDARD DOSING   1 Each  Other  PRN                    Labs:   Reviewed.      Recent Results (from the past 24 hour(s))     CULTURE, RESPIRATORY/SPUTUM/BRONCH W GRAM STAIN          Collection Time: 05/16/21 10:30 AM       Specimen: Bronchial Washing; Respiratory specimen         Result  Value  Ref Range            GRAM STAIN                  10 - 25 WBC's/lpf   <10 Epithelial cells/lpf   Mucus Present   No Organisms Seen          GLUCOSE, POC          Collection Time: 05/16/21 11:55 AM         Result  Value  Ref Range            Glucose  (POC)  290 (H)  65 - 105 mg/dL       POTASSIUM          Collection Time: 05/16/21  1:38 PM         Result  Value  Ref Range            Potassium  3.9  3.5 - 5.1 mEq/L       POTASSIUM          Collection Time: 05/16/21  4:07 PM         Result  Value  Ref Range            Potassium  3.9  3.5 - 5.1 mEq/L       GLUCOSE, POC          Collection Time: 05/16/21  5:34 PM         Result  Value  Ref Range            Glucose (POC)  275 (H)  65 - 105 mg/dL       GLUCOSE, POC          Collection Time: 05/16/21 11:37 PM         Result  Value  Ref Range            Glucose (POC)  237 (H)  65 - 105 mg/dL       METABOLIC  PANEL, BASIC          Collection Time: 05/17/21  4:33 AM         Result  Value  Ref Range            Potassium  4.2  3.5 - 5.1 mEq/L       Chloride  105  98 - 107 mEq/L       Sodium  141  136 - 145 mEq/L       CO2  24  20 - 31 mEq/L       Glucose  238 (H)  74 - 106 mg/dl       BUN  20  9 - 23 mg/dl       Creatinine  0.98  0.55 - 1.02 mg/dl       GFR est AA  >11.9          GFR est non-AA  >60          Calcium  9.0  8.7 - 10.4 mg/dl       Anion gap  12  5 - 15 mmol/L       MAGNESIUM          Collection Time: 05/17/21  4:33 AM         Result  Value  Ref Range            Magnesium  1.8  1.6 - 2.6 mg/dL       NT-PRO BNP          Collection Time: 05/17/21  4:33 AM         Result  Value  Ref Range            NT pro-BNP  64  0 - 125         PHOSPHORUS          Collection Time: 05/17/21  4:33 AM         Result  Value  Ref Range            Phosphorus  3.3  2.4 - 5.1 mg/dL       GLUCOSE, POC          Collection Time: 05/17/21  5:05 AM         Result  Value  Ref Range            Glucose (POC)  228 (H)  65 - 105 mg/dL             Imaging:   Personally reviewed.                Zoe Lan, MD         Dragon medical dictation software was used for portions of this report. Unintended voice transcription errors may have occurred.

## 2021-05-17 NOTE — Progress Notes (Signed)
Pt placed back on Bipap for increased  wob, settings as charted

## 2021-05-17 NOTE — Progress Notes (Signed)
 Chaplain Initial Consultation    Start Visit: 1349  End Visit: 1351    Chaplain conducted an initial consultation and Spiritual Assessment for [name] [name], who is a 70 y.o.,female. Patient's Primary Language is: [place].   According to the patient's EMR Religious Affiliation is: [facility].     The reason the Patient came to the hospital is:   Patient Active Problem List    Diagnosis Date Noted    Respiratory failure (HCC) 05/11/2021    Acute CHF (congestive heart failure) (HCC) 01/24/2021    UTI (urinary tract infection) 06/01/2019    COPD exacerbation (HCC) 05/07/2019    Acute hypoxemic respiratory failure (HCC) 05/07/2019    Chronic respiratory failure with hypoxia (HCC) 08/22/2018    Headache 08/05/2018    Abdominal pain 02/04/2018    SIRS (systemic inflammatory response syndrome) (HCC) 10/09/2017    Hyperkalemia 09/08/2017    Obtunded 09/08/2017    Acute renal failure (ARF) (HCC) 09/08/2017    Septic shock (HCC) 09/08/2017    Metabolic encephalopathy 09/08/2017    Dehydration 08/07/2017    COPD with acute exacerbation (HCC) 08/07/2017    Acute-on-chronic kidney injury (HCC) 08/07/2017    Chest pain 06/15/2017    Dyspnea 06/15/2017    Type 2 diabetes mellitus with diabetic neuropathy (HCC) 12/07/2016    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    Cannabinoid hyperemesis syndrome 08/17/2016    Gastritis 08/16/2016    Nausea & vomiting 08/16/2016    Asthma with acute exacerbation 08/04/2016    Lactic acidosis 07/24/2016    Leukocytosis 07/24/2016    Sepsis (HCC) 07/24/2016    Asthma exacerbation 07/24/2016    Type 2 diabetes mellitus with nephropathy (HCC) 06/12/2016    Sacroiliitis (HCC) 11/25/2015    Spondylosis of lumbar region without myelopathy or radiculopathy 09/15/2015    Lumbar and sacral osteoarthritis 09/15/2015    Chronic pain syndrome 09/15/2015    Uncontrolled type 2 diabetes mellitus with hyperglycemia (HCC) 08/20/2015    Acute colitis 07/02/2015    Acute hyperglycemia 07/02/2015    Accelerated  hypertension 04/08/2015    Severe headache 04/07/2015    Osteoarthritis of hips, 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   Offered prayer at door on patient's behalf.   Chart reviewed.    The following outcomes where achieved:  Patient asleep for chaplain's visit.    Assessment:  Patient does not have any known religious/cultural needs that will affect patient's preferences in health care.  There are no spiritual or religious issues which require intervention at this time.     Plan:  Chaplains will continue to follow and will provide pastoral care on an as needed/requested basis.  Chaplain recommends bedside caregivers page chaplain on duty if patient shows signs of acute spiritual or emotional distress.    Alexa Quintin Verle Mickey.   Pharmacologist Care  [phone]

## 2021-05-18 ENCOUNTER — Inpatient Hospital Stay: Admit: 2021-05-18 | Payer: MEDICARE | Primary: Physician Assistant

## 2021-05-18 LAB — BASIC METABOLIC PANEL
Anion Gap: 8 mmol/L (ref 5–15)
BUN: 20 mg/dl (ref 9–23)
CO2: 29 mEq/L (ref 20–31)
Calcium: 8.7 mg/dl (ref 8.7–10.4)
Chloride: 104 mEq/L (ref 98–107)
Creatinine: 0.53 mg/dl — ABNORMAL LOW (ref 0.55–1.02)
EGFR IF NonAfrican American: >60
GFR African American: >60
Glucose: 180 mg/dl — ABNORMAL HIGH (ref 74–106)
Potassium: 3.7 mEq/L (ref 3.5–5.1)
Sodium: 141 mEq/L (ref 136–145)

## 2021-05-18 LAB — POTASSIUM
Potassium: 3.3 mEq/L — ABNORMAL LOW (ref 3.5–5.1)
Potassium: 3.3 mEq/L — ABNORMAL LOW (ref 3.5–5.1)

## 2021-05-18 LAB — CBC WITH AUTO DIFFERENTIAL
Anisocytosis: 1
Basophils %: 0.1 % (ref 0–3)
Eosinophils %: 0.1 % (ref 0–5)
Hematocrit: 32.4 % — ABNORMAL LOW (ref 37.0–50.0)
Hemoglobin: 9.9 gm/dl — ABNORMAL LOW (ref 13.0–17.2)
Immature Granulocytes: 0.7 % (ref 0.0–3.0)
Lymphocytes %: 4.7 % — ABNORMAL LOW (ref 28–48)
MACROCYTES, MACROCYT: 1
MCH: 26.6 pg (ref 25.4–34.6)
MCHC: 30.6 gm/dl (ref 30.0–36.0)
MCV: 87.1 fL (ref 80.0–98.0)
MPV: 13.4 fL — ABNORMAL HIGH (ref 6.0–10.0)
Monocytes %: 2.8 % (ref 1–13)
Neutrophils %: 90.7 % — ABNORMAL HIGH (ref 34–64)
Nucleated RBCs: 0 (ref 0–0)
PLASMA CELL, PLASMA CELL: 1
Platelet Comment: DECREASED
Platelets: 102 10*3/uL — ABNORMAL LOW (ref 140–450)
RBC: 3.72 M/uL (ref 3.60–5.20)
RDW-SD: 50 — ABNORMAL HIGH (ref 36.4–46.3)
UNIDENTIFIED MONONUCLEAR CELL, UNIDMONO: 0.9 % — ABNORMAL HIGH (ref 0–0)
WBC: 17.2 10*3/uL — ABNORMAL HIGH (ref 4.0–11.0)

## 2021-05-18 LAB — POCT GLUCOSE
POC Glucose: 109 mg/dL — ABNORMAL HIGH (ref 65–105)
POC Glucose: 163 mg/dL — ABNORMAL HIGH (ref 65–105)
POC Glucose: 236 mg/dL — ABNORMAL HIGH (ref 65–105)
POC Glucose: 227 mg/dL — ABNORMAL HIGH (ref 65–105)
POC Glucose: 158 mg/dL — ABNORMAL HIGH (ref 65–105)
POC Glucose: 186 mg/dL — ABNORMAL HIGH (ref 65–105)

## 2021-05-18 LAB — CBC WITH AUTOMATED DIFF
Anisocytosis: 1
BASOPHILS: 0.1 % (ref 0–3)
EOSINOPHILS: 0.1 % (ref 0–5)
HCT: 32.4 % — ABNORMAL LOW (ref 37.0–50.0)
HGB: 9.9 gm/dl — ABNORMAL LOW (ref 13.0–17.2)
IMMATURE GRANULOCYTES: 0.7 % (ref 0.0–3.0)
LYMPHOCYTES: 4.7 % — ABNORMAL LOW (ref 28–48)
MCH: 26.6 pg (ref 25.4–34.6)
MCHC: 30.6 gm/dl (ref 30.0–36.0)
MCV: 87.1 fL (ref 80.0–98.0)
MONOCYTES: 2.8 % (ref 1–13)
MPV: 13.4 fL — ABNORMAL HIGH (ref 6.0–10.0)
Macrocytes: 1
NEUTROPHILS: 90.7 % — ABNORMAL HIGH (ref 34–64)
NRBC: 0 (ref 0–0)
PLASMA CELL: 1
PLATELET COMMENTS: DECREASED
PLATELET: 102 10*3/uL — ABNORMAL LOW (ref 140–450)
RBC: 3.72 M/uL (ref 3.60–5.20)
RDW-SD: 50 — ABNORMAL HIGH (ref 36.4–46.3)
UNIDENTIFIED MONONUCLEAR CELL: 0.9 % — ABNORMAL HIGH (ref 0–0)
WBC: 17.2 10*3/uL — ABNORMAL HIGH (ref 4.0–11.0)

## 2021-05-18 LAB — METABOLIC PANEL, BASIC
Anion gap: 8 mmol/L (ref 5–15)
BUN: 20 mg/dl (ref 9–23)
CO2: 29 mEq/L (ref 20–31)
Calcium: 8.7 mg/dl (ref 8.7–10.4)
Chloride: 104 mEq/L (ref 98–107)
Creatinine: 0.53 mg/dl — ABNORMAL LOW (ref 0.55–1.02)
GFR est AA: >60
GFR est non-AA: >60
Glucose: 180 mg/dl — ABNORMAL HIGH (ref 74–106)
Potassium: 3.7 mEq/L (ref 3.5–5.1)
Sodium: 141 mEq/L (ref 136–145)

## 2021-05-18 LAB — GLUCOSE, POC
Glucose (POC): 109 mg/dL — ABNORMAL HIGH (ref 65–105)
Glucose (POC): 163 mg/dL — ABNORMAL HIGH (ref 65–105)
Glucose (POC): 236 mg/dL — ABNORMAL HIGH (ref 65–105)
Glucose (POC): 227 mg/dL — ABNORMAL HIGH (ref 65–105)
Glucose (POC): 158 mg/dL — ABNORMAL HIGH (ref 65–105)
Glucose (POC): 186 mg/dL — ABNORMAL HIGH (ref 65–105)

## 2021-05-18 MED ORDER — CLONIDINE 0.2 MG TAB
0.2 mg | Freq: Two times a day (BID) | ORAL | Status: AC
Start: 2021-05-18 — End: 2021-05-26
  Administered 2021-05-18 – 2021-05-25 (×15): via ORAL

## 2021-05-18 MED ORDER — POTASSIUM CHLORIDE SR 10 MEQ TAB
10 mEq | Freq: Once | ORAL | Status: AC
Start: 2021-05-18 — End: 2021-05-18
  Administered 2021-05-18: 11:00:00 via ORAL

## 2021-05-18 MED ORDER — BUDESONIDE 0.5 MG/2 ML NEB SUSPENSION
0.5 mg/2 mL | Freq: Two times a day (BID) | RESPIRATORY_TRACT | Status: DC
Start: 2021-05-18 — End: 2021-05-18
  Administered 2021-05-18: 13:00:00 via RESPIRATORY_TRACT

## 2021-05-18 MED ORDER — FUROSEMIDE 10 MG/ML IJ SOLN
10 mg/mL | Freq: Once | INTRAMUSCULAR | Status: AC
Start: 2021-05-18 — End: 2021-05-18
  Administered 2021-05-18: 17:00:00 via INTRAVENOUS

## 2021-05-18 MED ORDER — AMLODIPINE 5 MG TAB
5 mg | Freq: Every day | ORAL | Status: AC
Start: 2021-05-18 — End: 2021-05-26
  Administered 2021-05-18 – 2021-05-25 (×8): via ORAL

## 2021-05-18 MED ORDER — IPRATROPIUM-ALBUTEROL 2.5 MG-0.5 MG/3 ML NEB SOLUTION
2.5 mg-0.5 mg/3 ml | RESPIRATORY_TRACT | Status: AC
Start: 2021-05-18 — End: 2021-05-18
  Administered 2021-05-18: 14:00:00

## 2021-05-18 MED ORDER — IPRATROPIUM-ALBUTEROL 2.5 MG-0.5 MG/3 ML NEB SOLUTION
2.5 mg-0.5 mg/3 ml | RESPIRATORY_TRACT | Status: AC
Start: 2021-05-18 — End: 2021-05-18
  Administered 2021-05-18: 14:00:00 via RESPIRATORY_TRACT

## 2021-05-18 MED ORDER — ELECTROLYTE REPLACEMENT PROTOCOL
Status: AC | PRN
Start: 2021-05-18 — End: 2021-05-19

## 2021-05-18 MED ORDER — TIOTROPIUM BROMIDE 2.5 MCG/ACTUATION MIST FOR INHALATION
2.5 mcg/actuation | Freq: Every day | RESPIRATORY_TRACT | Status: AC
Start: 2021-05-18 — End: 2021-05-26
  Administered 2021-05-18 – 2021-05-25 (×8): via RESPIRATORY_TRACT

## 2021-05-18 MED ORDER — FLUTICASONE 200 MCG-VILANTEROL 25 MCG/DOSE BREATH ACTIVATED INHALER
200-25 mcg/dose | Freq: Every day | RESPIRATORY_TRACT | Status: AC
Start: 2021-05-18 — End: 2021-05-26
  Administered 2021-05-18 – 2021-05-25 (×8): via RESPIRATORY_TRACT

## 2021-05-18 MED ORDER — POTASSIUM CHLORIDE 10 MEQ/100 ML IV PIGGY BACK
10 mEq/0 mL | INTRAVENOUS | Status: AC
Start: 2021-05-18 — End: 2021-05-18
  Administered 2021-05-18: 20:00:00 via INTRAVENOUS

## 2021-05-18 MED ORDER — IPRATROPIUM-ALBUTEROL 2.5 MG-0.5 MG/3 ML NEB SOLUTION
2.5 mg-0.5 mg/3 ml | RESPIRATORY_TRACT | Status: AC
Start: 2021-05-18 — End: 2021-05-18
  Administered 2021-05-18: 13:00:00 via RESPIRATORY_TRACT

## 2021-05-18 MED FILL — ADMELOG U-100 INSULIN LISPRO 100 UNIT/ML SUBCUTANEOUS SOLUTION: 100 unit/mL | SUBCUTANEOUS | Qty: 1

## 2021-05-18 MED FILL — IPRATROPIUM-ALBUTEROL 2.5 MG-0.5 MG/3 ML NEB SOLUTION: 2.5 mg-0.5 mg/3 ml | RESPIRATORY_TRACT | Qty: 3

## 2021-05-18 MED FILL — PIPERACILLIN-TAZOBACTAM 3.375 GRAM IV SOLR: 3.375 gram | INTRAVENOUS | Qty: 3.38

## 2021-05-18 MED FILL — POTASSIUM CHLORIDE 10 MEQ/100 ML IV PIGGY BACK: 10 mEq/0 mL | INTRAVENOUS | Qty: 100

## 2021-05-18 MED FILL — SOLU-MEDROL (PF) 40 MG/ML SOLUTION FOR INJECTION: 40 mg/mL | INTRAMUSCULAR | Qty: 1

## 2021-05-18 MED FILL — DEXMEDETOMIDINE 200 MCG/50 ML (4 MCG/ML) IN 0.9 % SODIUM CHLORIDE IV: 200 mcg/50 mL (4 mcg/mL) | INTRAVENOUS | Qty: 50

## 2021-05-18 MED FILL — AMLODIPINE 5 MG TAB: 5 mg | ORAL | Qty: 2

## 2021-05-18 MED FILL — HYDRALAZINE 20 MG/ML IJ SOLN: 20 mg/mL | INTRAMUSCULAR | Qty: 1

## 2021-05-18 MED FILL — MONTELUKAST 10 MG TAB: 10 mg | ORAL | Qty: 1

## 2021-05-18 MED FILL — BUDESONIDE 0.5 MG/2 ML NEB SUSPENSION: 0.5 mg/2 mL | RESPIRATORY_TRACT | Qty: 2

## 2021-05-18 MED FILL — SODIUM CHLORIDE 3 % NEB SOLUTION: 3 % | RESPIRATORY_TRACT | Qty: 15

## 2021-05-18 MED FILL — CLONIDINE 0.1 MG TAB: 0.1 mg | ORAL | Qty: 2

## 2021-05-18 MED FILL — BREO ELLIPTA 200 MCG-25 MCG/DOSE POWDER FOR INHALATION: 200-25 mcg/dose | RESPIRATORY_TRACT | Qty: 28

## 2021-05-18 MED FILL — SPIRIVA RESPIMAT 2.5 MCG/ACTUATION SOLUTION FOR INHALATION: 2.5 mcg/actuation | RESPIRATORY_TRACT | Qty: 4

## 2021-05-18 MED FILL — POTASSIUM CHLORIDE SR 10 MEQ TAB: 10 mEq | ORAL | Qty: 2

## 2021-05-18 MED FILL — HEPARIN (PORCINE) 5,000 UNIT/ML IJ SOLN: 5000 unit/mL | INTRAMUSCULAR | Qty: 1

## 2021-05-18 MED FILL — PANTOPRAZOLE 40 MG IV SOLR: 40 mg | INTRAVENOUS | Qty: 40

## 2021-05-18 MED FILL — FUROSEMIDE 10 MG/ML IJ SOLN: 10 mg/mL | INTRAMUSCULAR | Qty: 2

## 2021-05-18 MED FILL — ELECTROLYTE REPLACEMENT PROTOCOL: Qty: 1

## 2021-05-18 MED FILL — DOCUSATE SODIUM 50 MG/5 ML ORAL LIQUID: 50 mg/5 mL | ORAL | Qty: 10

## 2021-05-18 NOTE — Progress Notes (Signed)
Patient currently off Bipap resting comfortably on 3L NC, O2 sats 96%. Will monitor through the night.

## 2021-05-18 NOTE — Progress Notes (Signed)
Chaplain Follow-Up    Start Visit: 17:50  End Visit: 18:00    Chaplain conducted a Follow up consultation and Spiritual Assessment for [name] [name], who is a 70 y.o.,female.      The Chaplain provided the following Interventions:  Staff RN requested visit.  Provided pastoral care and support for patient.   There was no family was present.  Active listening.  Provided chaplaincy education.  Provided information about Pastoral Care Services.  Assured patient of continued pastoral care and prayer support as needed.  Chart reviewed.    The following outcomes where achieved:  Patient visit, support and prayer offered on patient's behalf at bedside.  Patient expressed gratitude for pastoral care and prayer.     Assessment:  Patient does not have any religious/cultural needs that will affect patient's preferences in health care.  There are no spiritual or religious issues which require intervention at this time.     Plan:  Chaplains will continue to follow and will provide pastoral care on an as needed/requested basis.    Oswald Hillock [employer]  Pastoral Care  [phone]

## 2021-05-18 NOTE — Progress Notes (Signed)
Progress Notes by Zoe Lan, MD at 05/18/21 0920                Author: Zoe Lan, MD  Service: Pulmonary Disease  Author Type: Physician       Filed: 05/18/21 0934  Date of Service: 05/18/21 0920  Status: Signed          Editor: Zoe Lan, MD (Physician)                              [facility] PULMONARY AND CRITICAL CARE MEDICINE           Progress Note         Name:  [name] [name]        DOB:  30-Jun-1950     MRN:  [id number]        Date:  05/18/2021      [x]  I have reviewed the flowsheet and previous days notes. Events, vitals, medications and notes from last 24 hours reviewed.         ASSESSMENT:     - Acute hypoxic hypercapnic respiratory failure requiring intubation (on 05/11/2021) likely secondary to pneumonia - h/o intubation in the past.  Extubated 05/17/2021.   -CAP with L mucus plugging / atelectasis.  H/o same in 2020 on CT.   s/p full bronchoscopy 05/16/21 -> mild secretions / irritation L airways/ no mucus plug - dynamic collapse Sup segt LLL.  Leukocytosis  resolved.  Sputum cultures 05/12/2021 negative.  Bronch cultures negative and cytology pending.   -1 out of 2 blood cultures positive for Corynebacterium species on 05/11/2021.  Likely contamination.  MRSA swab negative.   -Hemoptysis secondary to pneumonia.  Resolved.   - Acute COPD exacerbation - with chronic COPD despite no smoking hx - followed previously by Dr. 05/13/2021, now Dr. Helene Shoe.  on Advair, Spiriva and Singulair at home.   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    - Hx Lung nodule - 05/11/21 CTA Chest with LLL nodule (2 cm) - Chronic stable s/p CT guided bx in 2017   - Hx HTN - Clonidine 0.2 mg BID, Losartan 100 mg daily, Diltiazem CD 240 mg daily   - Hx HLD - Crestor 5 mg daily   - Hx DMII   - Hx Thoracic ascending aortic aneurysm   - Hx Hepatitis C   - Hx LUE/Subclavian DVT in 01/2021- Eliquis.  Resolved on PVL 05/12/2021.  Eliquis held due to mild hemoptysis.   - Hx PTSD    - Hx Chronic pain          PLAN:     -Currently on BiPAP and 35%.  Titrate for keep sats more than 88%.  We will try of BiPAP on nasal cannula as tolerated.   -wean FiO2 to keep SpO2 > 88%.     - Continue Duonebs q4, Singulair 10 mg daily and Solumedrol 40 mg IV q6.    - Continue Zosyn .  S/p Vancomycin. bronch Cultures pending.    -Start Breo and Spiriva.      - Avoid nephrotoxins. Renally dose medications. Strict I/Os. Daily weights.    - Glucommander per protocol. Goal BS 140-180.    - SUP: Protonix 40 mg IV daily.   - DVT prophylaxis: SCDs and SQ Heparin.   -Discussed with RT, RN, patient.  CC time 35 minutes.  Subjective:    Currently on BiPAP.    This morning she got more short of breath with chest tightness.  Receiving duo nebs.  No more hemoptysis.      ROS:    As above. Rest negative.              Allergy:     Allergies        Allergen  Reactions         ?  Chocolate [Cocoa]  Sneezing             Confirms allergy but reports "I still eat it"            Vital Signs:       Visit Vitals   BP  (!) 173/91      Pulse  (!) 103      Temp  (P) 97.7 ??F (36.5 ??C)      Resp  19      Ht   (1.702 m)      Wt  94.5 kg (208 lb 5.4 oz)      SpO2  99%      BMI  32.63 kg/m??                O2 Device: BIPAP     O2 Flow Rate (L/min): 2 l/min       Temp (24hrs), Avg:98.3 ??F (36.8 ??C), Min:97.7 ??F (36.5 ??C), Max:99.1 ??F (37.3 ??C)        Patient Vitals for the past 8 hrs:            Temp  Pulse  Resp  BP  SpO2            05/18/21 0900  --  (!) 103  19  (!) 173/91  99 %            05/18/21 0851  --  --  --  --  99 %     05/18/21 0823  --  --  --  --  99 %     05/18/21 0819  --  --  --  --  98 %     05/18/21 0800  --  (!) 118  22  (!) 210/123  95 %     05/18/21 0732  --  81  21  (!) 170/102  --     05/18/21 0700  --  (!) 105  20  --  100 %     05/18/21 0600  --  --  --  (!) 188/110  --     05/18/21 0417  --  --  --  --  98 %     05/18/21 0400  --  --  --  (!) 179/108  --     05/18/21 0317  (P) 97.7 ??F (36.5 ??C)  --   --  --  --            05/18/21 0200  --  77  18  (!) 176/91  98 %              Intake/Output:    Last shift:      12/06 0701 - 12/06 1900   In: 12.1 [I.V.:12.1]   Out: 500 [Urine:500]   Last 3 shifts: 12/04 1901 - 12/06 0700   In: 1135.2 [I.V.:485.2]   Out: 1425 [Urine:1425]      Intake/Output Summary (Last 24 hours) at 05/18/2021 0920   Last data filed at 05/18/2021 0900  Gross per 24 hour        Intake  130.3 ml        Output  1400 ml        Net  -1269.7 ml              Ventilator Settings:          Mode  Rate  Tidal Volume  Pressure  FiO2  PEEP            Spontaneous, Pressure support     400 ml   8 cm H2O  50 %  5 cm H20            Physical Exam:      GENERAL: As above.Marland Kitchen    NECK: Supple. Trachea midline.    RESPIRATORY: Bilateral BS present, decreased at bases. No rales or rhonchi.    CARDIOVASCULAR: S1 and S2 present, regular. No murmur, rub, or thrill.    ABDOMEN: Soft and nontender with positive bowel sounds. No organomegaly.    NEUROLOGICAL: As above.     EXT: No edema or cyanosis.          DATA:      Current Facility-Administered Medications          Medication  Dose  Route  Frequency           ?  budesonide (PULMICORT) 500 mcg/2 ml nebulizer suspension   1,000 mcg  Nebulization  BID RT     ?  albuterol-ipratropium (DUO-NEB) 2.5 mg-0.5 mg/3 ml nebulizer solution            ?  methylPREDNISolone (PF) (SOLU-MEDROL) injection 40 mg   40 mg  IntraVENous  Q6H     ?  albuterol-ipratropium (DUO-NEB) 2.5 MG-0.5 MG/3 ML   3 mL  Nebulization  Q4H RT     ?  fentaNYL citrate (PF) injection 25 mcg   25 mcg  IntraVENous  Q1H PRN           ?  NOREPINephrine (LEVOPHED) 16,000 mcg in dextrose 5% 250 mL infusion   0.5-16 mcg/min  IntraVENous  TITRATE           ?  heparin (porcine) injection 5,000 Units   5,000 Units  SubCUTAneous  Q8H     ?  dexmedeTOMidine in 0.9 % NaCl (PRECEDEX) 200 mcg/50 mL (4 mcg/mL) infusion soln   0.1-1.5 mcg/kg/hr  IntraVENous  TITRATE     ?  hydrALAZINE (APRESOLINE) 20 mg/mL injection 20 mg   20  mg  IntraVENous  Q6H PRN     ?  propofol (DIPRIVAN) 10 mg/mL infusion   0-50 mcg/kg/min  IntraVENous  TITRATE     ?  pantoprazole (PROTONIX) 40 mg in 0.9% sodium chloride 10 mL injection   40 mg  IntraVENous  DAILY     ?  dextrose (D50W) injection syrg 5-25 g   10-50 mL  IntraVENous  PRN     ?  glucagon (GLUCAGEN) injection 1 mg   1 mg  IntraMUSCular  PRN     ?  insulin glargine (LANTUS) injection 1-100 Units   1-100 Units  SubCUTAneous  QHS     ?  insulin lispro (HUMALOG) injection 1-100 Units   1-100 Units  SubCUTAneous  Q6H     ?  docusate (COLACE) 50 mg/5 mL oral liquid 100 mg   100 mg  Oral  BID     ?  piperacillin-tazobactam (ZOSYN) 3.375 g in 0.9% sodium chloride (MBP/ADV)  100 mL MBP   3.375 g  IntraVENous  Q8H     ?  montelukast (SINGULAIR) tablet 10 mg   10 mg  Oral  QHS     ?  dextrose (D50W) injection syrg 10-15 g   20-30 mL  IntraVENous  PRN     ?  white petrolatum-mineral oiL (LACRILUBE S.O.P.) ointment 1 Each   1 Each  Both Eyes  DAILY           ?  PHOSPHATE ELECTROLYTE REPLACEMENT PROTOCOL STANDARD DOSING   1 Each  Other  PRN           ?  MAGNESIUM ELECTROLYTE REPLACEMENT PROTOCOL STANDARD DOSING   1 Each  Other  PRN     ?  POTASSIUM ELECTROLYTE REPLACEMENT PROTOCOL STANDARD DOSING   1 Each  Other  PRN           ?  CALCIUM ELECTROLYTE REPLACEMENT PROTOCOL STANDARD DOSING   1 Each  Other  PRN                    Labs:   Reviewed.      Recent Results (from the past 24 hour(s))     CBC WITH AUTOMATED DIFF          Collection Time: 05/17/21 10:09 AM         Result  Value  Ref Range            WBC  17.1 (H)  4.0 - 11.0 1000/mm3       RBC  3.70  3.60 - 5.20 M/uL       HGB  10.2 (L)  13.0 - 17.2 gm/dl       HCT  44.8 (L)  18.5 - 50.0 %       MCV  86.2  80.0 - 98.0 fL       MCH  27.6  25.4 - 34.6 pg       MCHC  32.0  30.0 - 36.0 gm/dl       PLATELET  631 (L)  140 - 450 1000/mm3       MPV  13.6 (H)  6.0 - 10.0 fL       RDW-SD  49.1 (H)  36.4 - 46.3         NRBC  0  0 - 0         Anisocytosis  1           IMMATURE GRANULOCYTES  5.0 (H)  0.0 - 3.0 %       NEUTROPHILS  89.8 (H)  34 - 64 %       LYMPHOCYTES  4.1 (L)  28 - 48 %       MONOCYTES  6.1  1 - 13 %       EOSINOPHILS  0.0  0 - 5 %       BASOPHILS  0.0  0 - 3 %       PLATELET COMMENTS  DECREASED          GLUCOSE, POC          Collection Time: 05/17/21 11:54 AM         Result  Value  Ref Range            Glucose (POC)  223 (H)  65 - 105 mg/dL       GLUCOSE, POC          Collection Time: 05/17/21  7:55 PM         Result  Value  Ref Range            Glucose (POC)  109 (H)  65 - 105 mg/dL       GLUCOSE, POC          Collection Time: 05/17/21 11:01 PM         Result  Value  Ref Range            Glucose (POC)  163 (H)  65 - 105 mg/dL       METABOLIC PANEL, BASIC          Collection Time: 05/18/21 12:41 AM         Result  Value  Ref Range            Potassium  3.7  3.5 - 5.1 mEq/L       Chloride  104  98 - 107 mEq/L       Sodium  141  136 - 145 mEq/L       CO2  29  20 - 31 mEq/L       Glucose  180 (H)  74 - 106 mg/dl       BUN  20  9 - 23 mg/dl       Creatinine  4.54 (L)  0.55 - 1.02 mg/dl       GFR est AA  >09.8          GFR est non-AA  >60          Calcium  8.7  8.7 - 10.4 mg/dl       Anion gap  8  5 - 15 mmol/L       CBC WITH AUTOMATED DIFF          Collection Time: 05/18/21 12:41 AM         Result  Value  Ref Range            WBC  17.2 (H)  4.0 - 11.0 1000/mm3       NEUTROPHILS  90.7 (H)  34 - 64 %       LYMPHOCYTES  4.7 (L)  28 - 48 %       RBC  3.72  3.60 - 5.20 M/uL       MONOCYTES  2.8  1 - 13 %       HGB  9.9 (L)  13.0 - 17.2 gm/dl       HCT  11.9 (L)  14.7 - 50.0 %       MCV  87.1  80.0 - 98.0 fL       MCH  26.6  25.4 - 34.6 pg            MCHC  30.6  30.0 - 36.0 gm/dl            PLATELET  829 (L)  140 - 450 1000/mm3       UNIDENTIFIED MONONUCLEAR CELL  0.9 (H)  0 - 0 %       MPV  13.4 (H)  6.0 - 10.0 fL       RDW-SD  50.0 (H)  36.4 - 46.3         NRBC  0  0 - 0         Anisocytosis  1          IMMATURE GRANULOCYTES  0.7  0.0 - 3.0 %  Macrocytes  1           EOSINOPHILS  0.1  0 - 5 %       BASOPHILS  0.1  0 - 3 %       PLASMA CELL  1          PLATELET COMMENTS  DECREASED          GLUCOSE, POC          Collection Time: 05/18/21  2:55 AM         Result  Value  Ref Range            Glucose (POC)  236 (H)  65 - 105 mg/dL       GLUCOSE, POC          Collection Time: 05/18/21  5:42 AM         Result  Value  Ref Range            Glucose (POC)  227 (H)  65 - 105 mg/dL             Imaging:   Personally reviewed.                Zoe Lan, MD         Dragon medical dictation software was used for portions of this report. Unintended voice transcription errors may have occurred.

## 2021-05-18 NOTE — Progress Notes (Signed)
OCCUPATIONAL THERAPY         Patient: [name] [name] (70 y.o. female)  Room: 4309/4309    Primary Diagnosis: Respiratory failure (HCC) [J96.90]   Procedure(s) (LRB):  BRONCHOSCOPY Bedside ICU-9 (N/A) 4 Days Post-Op  Date of Admission: 05/11/2021   Length of Stay:  7 day(s)  Insurance: Payor: [employer] MEDICARE / Plan: [facility] [employer] MEDICARE HMO / Product Type: Managed Care Medicare /      Date: 05/18/2021  Time: 1015, 1400    OBJECTIVE:     Orders, labs, and chart reviewed on [name] [name]. Communicated with nursing staff. Patient not cleared to participate in Occupational Therapy evaluation.     Occupational Therapy was attempted, however patient not seen due to:  []   Patient declined  []   Nausea/vomiting  []   Eating  []   Pain  []   Sleeping  []   Testing ongoing  []   Not available/with staff for care  []   Patient lethargic  [x]   Held by nurse or MD  []   Off unit taken off floor for MRI     1st attempt; asked MD during rounds who reports to check back in afternoon. 2nd attempt: RN reports unable to remove bipap and tolerate therapy this time.     PLAN:     Our services will follow up as indicated.     Charisse Klinefelter, OT  May 18, 2021

## 2021-05-18 NOTE — Progress Notes (Signed)
Progress Notes by Salley Slaughter, MD at 05/18/21 1027                Author: Salley Slaughter, MD  Service: Hospitalist  Author Type: Physician       Filed: 05/18/21 1110  Date of Service: 05/18/21 1027  Status: Signed          Editor: Salley Slaughter, MD (Physician)                          INTERNAL MEDICINE PROGRESS NOTE      Date of note:      May 18, 2021      Patient:               [name] [name], 70 y.o., female   Admit Date:        05/11/2021   Length of Stay:  7 day(s)      Problem List:      Patient Active Problem List        Diagnosis  Code         ?  Severe hypertension  I10     ?  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  PTSD (post-traumatic stress disorder)  F43.10     ?  Severe headache  R51.9     ?  Accelerated hypertension  I10     ?  Acute colitis  K52.9     ?  Acute hyperglycemia  R73.9     ?  Uncontrolled type 2 diabetes mellitus with hyperglycemia (HCC)  E11.65     ?  Spondylosis of lumbar region without myelopathy or radiculopathy  M47.816     ?  Lumbar and sacral osteoarthritis  M47.817     ?  Chronic pain syndrome  G89.4     ?  Sacroiliitis (HCC)  M46.1     ?  Type 2 diabetes mellitus with nephropathy (HCC)  E11.21     ?  Lactic acidosis  E87.20     ?  Leukocytosis  D72.829     ?  Sepsis (HCC)  A41.9     ?  Asthma exacerbation  J45.901     ?  Asthma with acute exacerbation  J45.901     ?  Gastritis  K29.70     ?  Nausea & vomiting  R11.2     ?  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  Cannabinoid hyperemesis syndrome  R11.2, F12.90     ?  Type 2 diabetes mellitus with diabetic neuropathy (HCC)  E11.40     ?  Chest pain  R07.9     ?  Dyspnea  R06.00     ?  Dehydration  E86.0     ?  COPD with acute exacerbation (HCC)  J44.1     ?  Acute-on-chronic kidney injury (HCC)  N17.9, N18.9     ?  Hyperkalemia  E87.5     ?  Obtunded  R40.1     ?  Acute renal failure (ARF) (HCC)  N17.9     ?  Septic shock (HCC)  A41.9, R65.21     ?  Metabolic encephalopathy  G93.41      ?  SIRS (systemic inflammatory response syndrome) (HCC)  R65.10     ?  Abdominal pain  R10.9     ?  Headache  R51.9     ?  Chronic respiratory failure with hypoxia (HCC)  J96.11     ?  COPD exacerbation (HCC)  J44.1     ?  Acute hypoxemic respiratory failure (HCC)  J96.01     ?  UTI (urinary tract infection)  N39.0     ?  Acute CHF (congestive heart failure) (HCC)  I50.9         ?  Respiratory failure (HCC)  J96.90             Subjective + interval history        Got anxious and hypoxic last night.was started on BiPAP and precedex.   Currently comfortably on BiPAP. Precedex has been stopped.         Assessment :         Acute on chronic hypoxic hypercapnic respiratory failure requiring intubation on 05/11/21   Community-acquired pneumonia with left mucous plugging/atelectasis, s/p bronchoscopy with 05/16/21-mild secretions, irritation left airways,  no mucous plug, dynamic collapse Sup segt LLL   Blood cultures 1 out of 2 positive for Corynebacterium, likely procurement contamination   Hemoptysis secondary to pneumonia   Acute COPD exacerbation - on 3L supplemental oxygen at baseline   Bullous emphysema   Hyperkalemia   Leukocytosis,    Bullous emphysema  Hypertension  Dyslipidemia  Type 2 diabetes mellitus  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 Stable left lung airspace disease   Will order lasix 20 mg X 1 today - check ECHO   Continue nebulized bronchodilator every 6 hours, Solu-Medrol increased to 40 mg every 6 hours.  Continue Singulair 10 mg daily.  Continue Zosyn for total 7 days-  bronch cultures are negative  so far          DVT ppx :  Subcutaneous heparin ( left upper and bilateral lower extremity venous PVL was negative for DVT ) - eliquis was held for bronchoscopy.   GI prophylaxis -Protonix       Discussed with  : RN at bedsode      - Code status: full code       Recommend to continue hospitalization.   Expected date of discharge: tbd   Plan for disposition - tbd        Objective:           Visit Vitals      BP  (!) 170/89     Pulse  (!) 106     Temp  (P) 97.7 ??F (36.5 ??C)     Resp  21     Ht  5\' 7"  (1.702 m)     Wt  94.5 kg (208 lb 5.4 oz)     SpO2  95%        BMI  32.63 kg/m??                       Intake/Output Summary (Last 24 hours) at 05/18/2021 1027   Last data filed at 05/18/2021 0900     Gross per 24 hour  Intake  130.3 ml        Output  1400 ml        Net  -1269.7 ml             Physical Exam:        GEN -alert, oriented to self and place, not in distress   HEENT - mucous membranes moist   Neck - supple, no JVD   Cardiac - RRR, S1, S2, no murmurs   Chest/Lungs - mild expiratory wheezing, mild crackles at lung bases   Abdomen - soft, obese, non tender   Extremities - no clubbing/ cyanosis/ edema   Neuro -follows commands, moves all 4 extremities   Skin - no rashes or lesions        Current medications:           Current Facility-Administered Medications:    ?  albuterol-ipratropium (DUO-NEB) 2.5 mg-0.5 mg/3 ml nebulizer solution, , , ,    ?  fluticasone-vilanterol (BREO ELLIPTA) 258mcg-25mcg/puff, 1 Puff, Inhalation, DAILY, Faythe Ghee C, MD   ?  tiotropium bromide (SPIRIVA RESPIMAT) 2.5 mcg /actuation, 2 Puff, Inhalation, DAILY, Zoe Lan, MD   ?  cloNIDine HCL (CATAPRES) tablet 0.2 mg, 0.2 mg, Oral, BID, Faythe Ghee C, MD   ?  methylPREDNISolone (PF) (SOLU-MEDROL) injection 40 mg, 40 mg, IntraVENous, Q6H, Faythe Ghee C, MD, 40 mg at 05/18/21 0533   ?  albuterol-ipratropium (DUO-NEB) 2.5 MG-0.5 MG/3 ML, 3 mL, Nebulization, Q4H RT, Zoe Lan, MD, 3 mL at 05/18/21 0817   ?  fentaNYL citrate (PF) injection 25  mcg, 25 mcg, IntraVENous, Q1H PRN, Imad, Melhem, MD, 50 mcg at 05/16/21 1007   ?  heparin (porcine) injection 5,000 Units, 5,000 Units, SubCUTAneous, Q8H, Imad, Melhem, MD, 5,000 Units at 05/18/21 0533   ?  hydrALAZINE (APRESOLINE) 20 mg/mL injection 20 mg, 20 mg, IntraVENous, Q6H PRN, Lottie Dawson, NP, 20 mg at 05/18/21 0749   ?  pantoprazole (PROTONIX) 40 mg in 0.9% sodium chloride 10 mL injection, 40 mg, IntraVENous, DAILY, Lottie Dawson, NP, 40 mg at 05/18/21  8295   ?  dextrose (D50W) injection syrg 5-25 g, 10-50 mL, IntraVENous, PRN, Lottie Dawson, NP   ?  glucagon (GLUCAGEN) injection 1 mg, 1 mg, IntraMUSCular, PRN, Lottie Dawson, NP   ?  insulin glargine (LANTUS) injection 1-100 Units, 1-100 Units, SubCUTAneous, QHS, Lottie Dawson, NP, 10 Units at 05/17/21 2141   ?  insulin lispro (HUMALOG) injection 1-100 Units, 1-100 Units, SubCUTAneous, Q6H, Lottie Dawson, NP, 1 Units at 05/18/21 [phone]   ?  docusate (COLACE) 50 mg/5 mL oral liquid 100 mg, 100 mg, Oral, BID, Imad, Melhem, MD, 100 mg at 05/17/21 2141   ?  piperacillin-tazobactam (ZOSYN) 3.375 g in 0.9% sodium chloride (MBP/ADV) 100 mL MBP, 3.375 g, IntraVENous, Q8H, Erling Conte, MD, Last  Rate: 25 mL/hr at 05/18/21 0909, 3.375 g at 05/18/21 0909   ?  montelukast (SINGULAIR) tablet 10 mg, 10 mg, Oral, QHS, Lauralee Evener, PA-C, 10 mg at 05/17/21 2141   ?  dextrose (D50W) injection syrg 10-15 g, 20-30 mL, IntraVENous, PRN, Lauralee Evener, PA-C   ?  white petrolatum-mineral oiL (LACRILUBE S.O.P.) ointment 1 Each, 1 Each, Both Eyes, DAILY, Erling Conte, MD, 1 Each at 05/18/21 [phone]   ?  PHOSPHATE ELECTROLYTE REPLACEMENT PROTOCOL STANDARD DOSING, 1 Each, Other, PRN, Coulter, Venetia Night, NP   ?  MAGNESIUM ELECTROLYTE REPLACEMENT PROTOCOL STANDARD DOSING, 1 Each,  Other, PRN, Coulter, Venetia Night, NP   ?  POTASSIUM ELECTROLYTE REPLACEMENT PROTOCOL STANDARD DOSING, 1 Each, Other, PRN, Coulter, Venetia Night, NP   ?  CALCIUM ELECTROLYTE  REPLACEMENT PROTOCOL STANDARD DOSING, 1 Each, Other, PRN, Donnajean Lopes, NP, 1 Each at 05/13/21 1400        Labs:          Recent Results (from the past 24 hour(s))     GLUCOSE, POC          Collection Time: 05/17/21 11:54 AM         Result  Value  Ref Range            Glucose (POC)  223 (H)  65 - 105 mg/dL       GLUCOSE, POC          Collection Time: 05/17/21  7:55 PM         Result  Value  Ref Range            Glucose (POC)  109 (H)  65 - 105 mg/dL       GLUCOSE, POC          Collection Time: 05/17/21 11:01 PM         Result  Value  Ref Range            Glucose (POC)  163 (H)  65 - 105 mg/dL       METABOLIC PANEL, BASIC          Collection Time: 05/18/21 12:41 AM         Result  Value  Ref Range            Potassium  3.7  3.5 - 5.1 mEq/L       Chloride  104  98 - 107 mEq/L       Sodium  141  136 - 145 mEq/L       CO2  29  20 - 31 mEq/L       Glucose  180 (H)  74 - 106 mg/dl       BUN  20  9 - 23 mg/dl       Creatinine  1.30 (L)  0.55 - 1.02 mg/dl       GFR est AA  >86.5          GFR est non-AA  >60          Calcium  8.7  8.7 - 10.4 mg/dl       Anion gap  8  5 - 15 mmol/L       CBC WITH AUTOMATED DIFF          Collection Time: 05/18/21 12:41 AM         Result  Value  Ref Range            WBC  17.2 (H)  4.0 - 11.0 1000/mm3       NEUTROPHILS  90.7 (H)  34 - 64 %       LYMPHOCYTES  4.7 (L)  28 - 48 %       RBC  3.72  3.60 - 5.20 M/uL       MONOCYTES  2.8  1 - 13 %       HGB  9.9 (L)  13.0 - 17.2 gm/dl       HCT  78.4 (L)  69.6 - 50.0 %       MCV  87.1  80.0 - 98.0  fL       MCH  26.6  25.4 - 34.6 pg       MCHC  30.6  30.0 - 36.0 gm/dl       PLATELET  540 (L)  140 - 450 1000/mm3       UNIDENTIFIED MONONUCLEAR CELL  0.9 (H)  0 - 0 %       MPV  13.4 (H)  6.0 - 10.0 fL       RDW-SD  50.0 (H)  36.4 - 46.3         NRBC  0  0 - 0         Anisocytosis  1          IMMATURE GRANULOCYTES  0.7  0.0 - 3.0 %       Macrocytes  1          EOSINOPHILS  0.1  0 - 5 %       BASOPHILS  0.1  0 - 3 %       PLASMA CELL  1           PLATELET COMMENTS  DECREASED          GLUCOSE, POC          Collection Time: 05/18/21  2:55 AM         Result  Value  Ref Range            Glucose (POC)  236 (H)  65 - 105 mg/dL       GLUCOSE, POC          Collection Time: 05/18/21  5:42 AM         Result  Value  Ref Range            Glucose (POC)  227 (H)  65 - 105 mg/dL           XR Results:   Results from Hospital Encounter encounter on 05/11/21      XR CHEST SNGL V      Narrative   Chest      Indication: SOB   Comparison: 05/17/2021      Findings:      AP portable upright view of the chest demonstrates that the cardiac silhouette   is not  enlarged. Stable left upper lobe bulla. Interval worsening of right   lower lobe airspace disease. Stable left mid and basilar airspace disease.      Impression   Impression:      Worsening right lower lobe airspace disease. Stable left lung airspace disease      Electronically signed by: Jodi Mourning, MD 05/18/2021 9:42 AM EST         CT Results:   Results from Hospital Encounter encounter on 05/11/21      CT CHEST W CON PE PROTOCOL      Narrative   All CT exams at this facility use one or more dose reduction techniques   including automatic exposure control, mA/kV adjustment per patient's size, or   iterative reconstruction technique.      Clinical History: rule out PE.      Examination:   CTA CHEST with contrast. 3 mm spiral scanning was performed from the lung apices   to the upper pole of the kidneys. 3D MIP coronal and sagittal reconstruction   imaging has been performed.      Correlation:   08/30/2018, 02/18/2018 and 07/03/2015      Findings:  Degenerative changes of the spine. Secretions dependent portions of the trachea.   Dense airspace opacity with air bronchograms left upper lobe. There are   bilateral bullae. Dependent atelectasis lung bases.      2 cm left lower lobe nodule slightly increased in size from 08/30/2018, 02/18/2018   and 07/02/2025 ((prior measurement 1.7, 1.9 and 1.3 cm). No dissection thoracic   aorta.  Ascending aorta measures 4.7 cm unchanged from prior studies. The   esophagus is not dilated.      No lymph node enlargement in the axilla, mediastinum or hila. No pulmonary   embolism. Visualized portions of the liver, pancreas, adrenal glands are   unremarkable. 2.3 cm splenic lesion unchanged from 02/18/2018. 1.8 cm midpole   right renal hypodensity, ultrasound characterization suggested.      Impression   IMPRESSION:   1. No pulmonary embolism.   2. Bilateral bullae   3. Left upper lobe pneumonia/volume loss. Radiographic follow-up to resolution   suggested.   4. 2 cm left lower lobe nodule not significantly changed from prior studies.   5. 2.3 cm stable splenic lesion, unchanged from 02/18/2018.   6. 1.8 cm probable cyst midpole right kidney, ultrasound characterization   suggested.      Electronically signed by: Jolayne Panther, MD 05/11/2021 10:11 PM EST                  VAS/US Results:   Results from Hospital Encounter encounter on 05/11/21      DUPLEX LOWER EXT VENOUS BILAT      Narrative   [id number]   [id number]   [id number]   Study ID: [id number]      [facility]   [address] [place],   [place]   [address]      Lower Extremity Venous Report      Name: GALINA, HADDOX Date: 05/17/2021 09:32 AM   MRN: [id number]                   Patient Location: [id number]   DOB: 1950-09-05               Age: 60 yrs   Gender: Female                Account #: [id number]   Reason For Study: acute hypoxic respiratory failure. ICD-10   J96.01   Ordering Physician: Vickey Huger   Referring Physician: [name]   Performed By: Miles Costain, RVT      Interpretation Summary   1.No evidence of deep vein thrombosis in the bilateral lower extremities.   2. In comparison to exam perfomed on 08/22/2019, no significant change is   noted.   ___________________________________________________________________________   QUALITY/PROCEDURE   Complete bilateral venous duplex  performed.      HISTORY/SYMPTOMS   Diabetes. COPD. HTN. previous left subclavian DVT.      RIGHT LEG   The right common femoral, femoral, popliteal, posterior tibial and   peroneal veins were examined with duplex ultrasound.   The deep veins were patent and compressible with no evidence of   intraluminal thrombus. Spontaneous, phasic venous   flow with normal augmentation was noted throughout the right leg.      RIGHT SAPHENOUS VEINS   Right great saphenous vein at the saphenofemoral junction demonstrates   normal compressibility with spontaneous and   phasic  venous hemodynamics.      LEFT LEG   The left common femoral, femoral, popliteal, posterior tibial and peroneal   veins were examined with duplex ultrasound.   The deep veins were patent and compressible with no evidence of   intraluminal thrombus. Spontaneous, phasic venous   flow with normal augmentation was noted throughout the left leg.      LEFT LEG SAPHENOUS VEINS   Left great saphenous vein at the saphenofemoral junction demonstrates   normal compressibility with spontaneous and   phasic venous hemodynamics.      Electronically signed byDr. Neta Ehlers, M.D   05/17/2021 12:00 PM            Total clinical care time was 45  minutes of which more than 50% was spent in coordination of care and counseling (time spent with patient/family face to face, physical exam, reviewing laboratory and imaging investigations, speaking with physicians and  nursing staff involved in this patient's care).       Dragon medical dictation system was used for part of this note. Unintentional voice recognition errors may occur      Tia Masker,  MD   [facility]   May 18, 2021   Time: 10:28 AM

## 2021-05-18 NOTE — Progress Notes (Signed)
PHYSICAL THERAPY         Patient: [name] [name] (70 y.o. female)  Room: 4309/4309    Date: 05/18/2021  Time:  14:15  Primary Diagnosis: Respiratory failure (HCC) [J96.90]    Orders reviewed, chart reviewed.   Communicated with patient's nurse.    OBJECTIVE:     Physical therapy evaluation was attempted, however PT held by nursing: Discussed with Vernona Rieger, RN who stated that patient remains unable to come off bipap at this time and unable to tolerate skilled PT evaluation at this time . Time spent performing chart review and discussing with RN.    PT will continue to monitor patient's status and follow up as schedule/patient status allow.    PLAN:     Our services will follow up as patient's condition and/or schedule permits.    Jacquenette Shone, PT, DPT

## 2021-05-18 NOTE — Progress Notes (Signed)
BiPAP Progress Note    Patient currently is On BiPAP  Patient tolerated BiPAP for the PM shift    BiPAP QHS.     BiPAP settings are:  Bio-Med ID #: [id number] ([id number])  IPAP:  18 cm H2O   EPAP: 8 cm H2O  Rate:   10  FiO2:   30 %     BiPAP will be removed from room after 48 hours of non-use

## 2021-05-18 NOTE — Progress Notes (Signed)
SPEECH- LANGUAGE PATHOLOGY NOTE   Patient: [name] [name] (70 y.o. female) [birth year]  Room: 4309/4309  Primary Diagnosis: Respiratory failure (HCC) [J96.90]    Procedure(s) (LRB):  BRONCHOSCOPY Bedside ICU-9 (N/A) 4 Days Post-Op  PMH:   Past Medical History:   Diagnosis Date    Arthritis     Chronic pain syndrome     related to R hip replacement    COPD (chronic obstructive pulmonary disease) (HCC)     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    DM2 (diabetes mellitus, type 2) (HCC)     GERD (gastroesophageal reflux disease)     Hepatitis C     HEP C    HTN (hypertension)     Lung nodule, multiple     (CT 10/2016) Small left upper lobe and 1.7 x 1.6 cm left lower lobe nodules not significantly changed from 07/23/2016 and 03/22/2016    Marijuana use     PTSD (post-traumatic stress disorder)     lived through the [facility] in 1993    Thoracic ascending aortic aneurysm [facility])     4.4cm noted on CT Chest (10/2016)    Thyroid nodule     2.5 cm stable right thyroid nodule (CT 10/2016)       Date: 05/18/2021   Time:  8:00     Orders received, chart reviewed.  Attempted dysphagia eval, unable to complete 2*  pt on BiPAP.   SLP will continue to follow per POC as appropriate.     Thank you,      Kellie Shropshire MS CCC-SLP  Speech-Language Pathologist  [phone] or Secure Chat

## 2021-05-19 ENCOUNTER — Inpatient Hospital Stay: Payer: MEDICARE | Primary: Physician Assistant

## 2021-05-19 ENCOUNTER — Inpatient Hospital Stay: Admit: 2021-05-19 | Payer: MEDICARE | Primary: Physician Assistant

## 2021-05-19 LAB — CBC WITH AUTO DIFFERENTIAL
Anisocytosis: 1
Atypical Lymphocytes: 1 % — ABNORMAL HIGH (ref 0–0)
Basophils %: 0 % (ref 0–3)
Eosinophils %: 0 % (ref 0–5)
GIANT PLATELETS: 1
Hematocrit: 36.4 % — ABNORMAL LOW (ref 37.0–50.0)
Hemoglobin: 11.3 gm/dl — ABNORMAL LOW (ref 13.0–17.2)
Hypochromasia: 1
Immature Granulocytes: 0.7 % (ref 0.0–3.0)
Lymphocytes %: 1 % — ABNORMAL LOW (ref 28–48)
MCH: 26.7 pg (ref 25.4–34.6)
MCHC: 31 gm/dl (ref 30.0–36.0)
MCV: 86.1 fL (ref 80.0–98.0)
MPV: 12.5 fL — ABNORMAL HIGH (ref 6.0–10.0)
Monocytes %: 5.1 % (ref 1–13)
Neutrophils %: 93 % — ABNORMAL HIGH (ref 34–64)
Nucleated RBCs: 0 (ref 0–0)
POLYCHROMASIA: 1
Platelet Comment: NORMAL
Platelets: 242 10*3/uL (ref 140–450)
RBC: 4.23 M/uL (ref 3.60–5.20)
RDW-SD: 49.2 — ABNORMAL HIGH (ref 36.4–46.3)
WBC: 22.9 10*3/uL — ABNORMAL HIGH (ref 4.0–11.0)

## 2021-05-19 LAB — CULTURE, RESPIRATORY/SPUTUM/BRONCH W GRAM STAIN
CULTURE RESULT: NO GROWTH
Culture Result: NO GROWTH

## 2021-05-19 LAB — POCT GLUCOSE
POC Glucose: 220 mg/dL — ABNORMAL HIGH (ref 65–105)
POC Glucose: 241 mg/dL — ABNORMAL HIGH (ref 65–105)
POC Glucose: 338 mg/dL — ABNORMAL HIGH (ref 65–105)
POC Glucose: 205 mg/dL — ABNORMAL HIGH (ref 65–105)

## 2021-05-19 LAB — CBC WITH AUTOMATED DIFF
ATYPICAL LYMPHS: 1 % — ABNORMAL HIGH (ref 0–0)
Anisocytosis: 1
BASOPHILS: 0 % (ref 0–3)
EOSINOPHILS: 0 % (ref 0–5)
Giant platelets: 1
HCT: 36.4 % — ABNORMAL LOW (ref 37.0–50.0)
HGB: 11.3 gm/dl — ABNORMAL LOW (ref 13.0–17.2)
HYPOCHROMASIA: 1
IMMATURE GRANULOCYTES: 0.7 % (ref 0.0–3.0)
LYMPHOCYTES: 1 % — ABNORMAL LOW (ref 28–48)
MCH: 26.7 pg (ref 25.4–34.6)
MCHC: 31 gm/dl (ref 30.0–36.0)
MCV: 86.1 fL (ref 80.0–98.0)
MONOCYTES: 5.1 % (ref 1–13)
MPV: 12.5 fL — ABNORMAL HIGH (ref 6.0–10.0)
NEUTROPHILS: 93 % — ABNORMAL HIGH (ref 34–64)
NRBC: 0 (ref 0–0)
PLATELET COMMENTS: NORMAL
PLATELET: 242 10*3/uL (ref 140–450)
Polychromasia: 1
RBC: 4.23 M/uL (ref 3.60–5.20)
RDW-SD: 49.2 — ABNORMAL HIGH (ref 36.4–46.3)
WBC: 22.9 10*3/uL — ABNORMAL HIGH (ref 4.0–11.0)

## 2021-05-19 LAB — GLUCOSE, POC
Glucose (POC): 220 mg/dL — ABNORMAL HIGH (ref 65–105)
Glucose (POC): 241 mg/dL — ABNORMAL HIGH (ref 65–105)
Glucose (POC): 338 mg/dL — ABNORMAL HIGH (ref 65–105)
Glucose (POC): 205 mg/dL — ABNORMAL HIGH (ref 65–105)

## 2021-05-19 MED ORDER — DIPHENHYDRAMINE HCL 50 MG/ML IJ SOLN
50 mg/mL | Freq: Once | INTRAMUSCULAR | Status: AC
Start: 2021-05-19 — End: 2021-05-19
  Administered 2021-05-19: 11:00:00 via INTRAVENOUS

## 2021-05-19 MED ORDER — INSULIN GLARGINE 100 UNIT/ML INJECTION
100 unit/mL | Freq: Every evening | SUBCUTANEOUS | Status: AC
Start: 2021-05-19 — End: 2021-05-26
  Administered 2021-05-20 – 2021-05-25 (×6): via SUBCUTANEOUS

## 2021-05-19 MED ORDER — ONDANSETRON (PF) 4 MG/2 ML INJECTION
4 mg/2 mL | Freq: Once | INTRAMUSCULAR | Status: AC
Start: 2021-05-19 — End: 2021-05-19
  Administered 2021-05-19: 11:00:00 via INTRAVENOUS

## 2021-05-19 MED ORDER — GLUCAGON 1 MG INJECTION
1 mg | INTRAMUSCULAR | Status: AC | PRN
Start: 2021-05-19 — End: 2021-05-26

## 2021-05-19 MED ORDER — INSULIN LISPRO 100 UNIT/ML INJECTION
100 unit/mL | Freq: Four times a day (QID) | SUBCUTANEOUS | Status: AC
Start: 2021-05-19 — End: 2021-05-26
  Administered 2021-05-19 – 2021-05-25 (×22): via SUBCUTANEOUS

## 2021-05-19 MED ORDER — METOCLOPRAMIDE 5 MG/ML IJ SOLN
5 mg/mL | Freq: Four times a day (QID) | INTRAMUSCULAR | Status: AC
Start: 2021-05-19 — End: 2021-05-20
  Administered 2021-05-19 – 2021-05-20 (×4): via INTRAVENOUS

## 2021-05-19 MED ORDER — DEXTROSE 5% IN WATER (D5W) IV
50 mg/mL | Freq: Once | INTRAVENOUS | Status: DC | PRN
Start: 2021-05-19 — End: 2021-05-19

## 2021-05-19 MED ORDER — ACETAMINOPHEN 325 MG TABLET
325 mg | Freq: Four times a day (QID) | ORAL | Status: AC | PRN
Start: 2021-05-19 — End: 2021-05-26
  Administered 2021-05-19 – 2021-05-24 (×8): via ORAL

## 2021-05-19 MED ORDER — INSULIN LISPRO 100 UNIT/ML INJECTION
100 unit/mL | SUBCUTANEOUS | Status: DC | PRN
Start: 2021-05-19 — End: 2021-05-26

## 2021-05-19 MED ORDER — DEXTROSE 50% IN WATER (D50W) IV
INTRAVENOUS | Status: DC | PRN
Start: 2021-05-19 — End: 2021-05-19

## 2021-05-19 MED FILL — AMLODIPINE 5 MG TAB: 5 mg | ORAL | Qty: 2

## 2021-05-19 MED FILL — IPRATROPIUM-ALBUTEROL 2.5 MG-0.5 MG/3 ML NEB SOLUTION: 2.5 mg-0.5 mg/3 ml | RESPIRATORY_TRACT | Qty: 3

## 2021-05-19 MED FILL — TYLENOL 325 MG TABLET: 325 mg | ORAL | Qty: 2

## 2021-05-19 MED FILL — HYDRALAZINE 20 MG/ML IJ SOLN: 20 mg/mL | INTRAMUSCULAR | Qty: 1

## 2021-05-19 MED FILL — LANTUS U-100 INSULIN 100 UNIT/ML SUBCUTANEOUS SOLUTION: 100 unit/mL | SUBCUTANEOUS | Qty: 1

## 2021-05-19 MED FILL — CLONIDINE 0.1 MG TAB: 0.1 mg | ORAL | Qty: 2

## 2021-05-19 MED FILL — PANTOPRAZOLE 40 MG IV SOLR: 40 mg | INTRAVENOUS | Qty: 40

## 2021-05-19 MED FILL — DEXTROSE 50% IN WATER (D50W) IV: INTRAVENOUS | Qty: 50

## 2021-05-19 MED FILL — SOLU-MEDROL (PF) 40 MG/ML SOLUTION FOR INJECTION: 40 mg/mL | INTRAMUSCULAR | Qty: 1

## 2021-05-19 MED FILL — DOCUSATE SODIUM 50 MG/5 ML ORAL LIQUID: 50 mg/5 mL | ORAL | Qty: 10

## 2021-05-19 MED FILL — HEPARIN (PORCINE) 5,000 UNIT/ML IJ SOLN: 5000 unit/mL | INTRAMUSCULAR | Qty: 1

## 2021-05-19 MED FILL — PIPERACILLIN-TAZOBACTAM 3.375 GRAM IV SOLR: 3.375 gram | INTRAVENOUS | Qty: 3.38

## 2021-05-19 MED FILL — METOCLOPRAMIDE 5 MG/ML IJ SOLN: 5 mg/mL | INTRAMUSCULAR | Qty: 2

## 2021-05-19 MED FILL — ONDANSETRON (PF) 4 MG/2 ML INJECTION: 4 mg/2 mL | INTRAMUSCULAR | Qty: 2

## 2021-05-19 MED FILL — FENTANYL CITRATE (PF) 50 MCG/ML IJ SOLN: 50 mcg/mL | INTRAMUSCULAR | Qty: 2

## 2021-05-19 MED FILL — MONTELUKAST 10 MG TAB: 10 mg | ORAL | Qty: 1

## 2021-05-19 MED FILL — DIPHENHYDRAMINE HCL 50 MG/ML IJ SOLN: 50 mg/mL | INTRAMUSCULAR | Qty: 1

## 2021-05-19 NOTE — Progress Notes (Signed)
BiPAP Progress Note    Patient currently is Off BiPAP  Patient refused BiPAP for the pm shift    BiPAP QHS    BiPAP settings are:  Bio-Med ID #: [id number]  IPAP:  18 cm H2O   EPAP: 10 cm H2O  Rate:   10  FiO2:   30 %     BiPAP will be removed from room after 48 hours of non-use    Patient has refused bipap for the night.  I instructed her to have nurse call if she changes her mind.

## 2021-05-19 NOTE — Progress Notes (Signed)
BiPAP Progress Note    Patient currently is Off BiPAP  Patient off  BiPAP for the PM shift    Patients O2 sats are 95% on 3L NC. Bipap on s/b    BiPAP settings are:  Bio-Med ID #: [id number]  IPAP:  18 cm H2O   EPAP: 10 cm H2O  Rate:   10  FiO2:   30 %     BiPAP will be removed from room after 48 hours of non-use

## 2021-05-19 NOTE — Progress Notes (Signed)
 OCCUPATIONAL THERAPY EVALUATION     Patient: [name] [name] (70 y.o. female)  Room: 4309/4309  Primary Diagnosis: Respiratory failure (HCC) [J96.90]   Procedure(s) (LRB):  BRONCHOSCOPY Bedside ICU-9 (N/A) 5 Days Post-Op    Date: 05/19/2021  In time:  1059        Out time:  1122  Total Time: 23 min  Evaluation Time: 15 min  Treatment Time: 8 min    Isolation:  There are currently no Active Isolations       MDRO: No active infections  Precautions: Falls. 3.5L oxygen, increased to 5L per RT  ADULT DIET Dysphagia - Soft & Bite Sized    Orders, labs, and chart reviewed. Communicated with nursing staff. Patient cleared to participate.    ASSESSMENT:      Based on the objective data described below, the patient presents with   - generalized muscle weakness affecting function in ADLs  - decreased functional mobility   - decreased activity tolerance, increased fatigue and/or shortness of breath  - increased pain in left  lower extremity   - decreased ability to do cross leg technique for lower body dressing affecting patient's ability to safely and independently perform basic ADLs/IADLs.    Patient will benefit from skilled occupational therapy intervention to address the above impairments.    Patient's rehabilitation potential is considered to be Fair.       PLAN :  Planned Interventions: Adaptive equipment, ADI training, activity tolerance, functional balance training, functional mobility training, therapeutic exercise, therapeutic activity, patient/caregiver education and training, and energy conservation.  Frequency/Duration: Patient to be seen 1-3x/week x 2 weeks.  Discharge Recommendations: Home health OT  Further Equipment Recommendations for Discharge: no needs anticipated; has DME available at home; patient's husband reports no room for hoyer lift     Patient and/or family have participated as able in goal setting and plan of care.    PRIOR LEVEL OF FUNCTION:     Information was obtained by:  patient, spouse  Home  environment: Patient lives with spouse in a 2 story townhouse with chair lift.      Prior level of function:Patient performs own feeding, grooming.  spouse assists with upper body bathing, upper body dressing, lower body bathing, lower body dressing, toileting.  Patient is bed bound and but husband will bear hug transfer patient to chair. Reports PT stopped working on transfers.  Prior level of Instrumental Activities of Daily Living: Patient does not perform any IADLs.  Home equipment: 3 in 1 bedside commode, rolling walker, wheelchair, hospital bed    EDUCATION:     Barriers to Learning/Limitations: None  Education provided: Patient, Spouse, Role of OT, Benefit of activity while hospitalized, Call for assistance, Staff assistance with mobility, Changes positions frequently, ADL training, Energy conservation, Safety, Functional mobility, Demonstrates adequately, and Verbalized understanding.  Educational Handouts issued: NONE.    SUBJECTIVE:   I want to go home.  --------------------------------------------------------------------------------------------------  Pain Assessment: Not rated  Pain Location:  LLE bruising to touch    OBJECTIVE DATA SUMMARY:     Patient found Bed, Telemetry, ICU/stepdown equip, Oxygen, IV, Bed alarm, and Family present.    Cognition     Mental Status: Oriented x3.  Communication: limited verbalization.  Attention span: fair(15-33min).  Follows commands: impaired and 1 step.  General Cognition: patient presents with and slow processing.  Hearing: grossly intact.  Vision:  grossly intact.      Activities of Daily Living    Eating: min. assist.  Grooming: mod. assist.  UB Bathing: max. assist.  LB Bathing: mod. assist.  UB Dressing: max. assist.  LB Dressing: max. assist  Toileting: max. assist.    ADL Comment:  based on clinical assessment and direct observation at bed level     Mobility    Rolling: min. assist to left and mod. Assist to right    Functional Balance    NT    Activity  Tolerance: requires oxygen and frequent rest breaks.    Upper Extremity Function    BUE ROM 75%  BUE strength 3/5  5 overhead arm raises with cues for breathing techniques    Final Location: bed, bed alarm, all needs close, agrees to call for assistance, family present, and 3.5L oxygen.       Micky Index of Independence in Activities of Daily Living    Activities  Points (1 or 0) Independence  (1 point)  NO supervision, direction, or personal assistance Dependence   (0 points)  WITH supervision, direction, personal assistance, or total care   Bathing []  (1 POINT) Bathes self completely or needs help in bathing only a single part of the body such as the back, genital area, or disabled extremity [x]  (0 POINTS) Need help with bathing more than one part of the body, getting in or out of the tub or showering. Requires total bathing.    Dressing []  (1 POINT) Get clothes from  closets and drawers and puts on clothes and outer garments complete with fasteners. May have help tying shoes.  [x]  (0 POINTS) Needs help with dressing self or needs to be completely dressed.    Toileting []  (1 POINT) Goes to toileting, gets on and off, arranges clothes, cleans genital area without help  [x]  Needs help transferring to the toileting, cleaning self or uses bedpan or commode    Transferring []  (1 POINT) Moves in and out of the bed or chair unassisted. Mechanical transfer aids are acceptable  [x] (0 POINTS) Needs help in moving from bed to chair or requires a complete transfer   Continence  []  (1 POINT) Exercises complete self control over urination and defecation  [x]  (0 POINTS) Is partially or totally incontinent of bowel or bladder   Feeding  []  (1 POINT) Gets food from plate into mouth without help. Preparation of food may be done by another person [x]  (0 POINTS) Needs partial or total help with feeding or requires parenteral feeding.     Total Score = 0             Scoring: 6 = High (patient independent) 0 = Low (patient very  dependent)      By Hoy Shutter, PhD, APRN, BC, [facility], and Ronal Corns, PhD, ARNP, [place]  [facility]      Occupational Therapy Goals:   OT goals initiated 05/19/2021 and will be met by patient within 10 sessions     Patient will complete UB dressing/bathing with Min A to increase I in ADLs.  Patient will self-feed 100% of meal with Set Up A to increase I in ADLs.   Patient will complete light grooming task at bed level with Set Up A to improve ADL function.   Patient will roll L/R with CGA to decrease caregiver burden during bed level ADLs.   _______________________________________________________________________    Thank you for this referral.    Leita MARLA Shan, OTR/L, CNS  Certified Neuro Specialist

## 2021-05-19 NOTE — Progress Notes (Signed)
Echo completed

## 2021-05-19 NOTE — Progress Notes (Signed)
Progress Notes by Zoe Lan, MD at 05/19/21 [phone]                Author: Zoe Lan, MD  Service: Pulmonary Disease  Author Type: Physician       Filed: 05/19/21 0910  Date of Service: 05/19/21 0905  Status: Signed          Editor: Zoe Lan, MD (Physician)                              [facility] PULMONARY AND CRITICAL CARE MEDICINE           Progress Note         Name:  [name] [name]        DOB:  [birth year]     MRN:  [id number]        Date:  05/19/2021      [x]  I have reviewed the flowsheet and previous days notes. Events, vitals, medications and notes from last 24 hours reviewed.         ASSESSMENT:     - Acute hypoxic hypercapnic respiratory failure requiring intubation (on 05/11/2021) likely secondary to pneumonia - h/o intubation in the past.  Extubated 05/17/2021.   -CAP with L mucus plugging / atelectasis.  H/o same in 2020 on CT.   s/p full bronchoscopy 05/16/21 -> mild secretions / irritation L airways/ no mucus plug - dynamic collapse Sup segt LLL.  Leukocytosis  resolved.  Sputum culture 05/12/2021 negative.  Bronch culture pathology negative.    -1 out of 2 blood cultures positive for Corynebacterium species on 05/11/2021.  Likely contamination.  MRSA swab negative.   -Hemoptysis secondary to pneumonia.  Resolved.   - Acute COPD exacerbation - with chronic COPD despite no smoking hx - followed previously by Dr. 05/13/2021, now Dr. Helene Shoe.  on Advair, Spiriva and Singulair at home.   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    - Hx Lung nodule - 05/11/21 CTA Chest with LLL nodule (2 cm) - Chronic stable s/p CT guided bx in 2017   - Hx HTN - Clonidine 0.2 mg BID, Losartan 100 mg daily, Diltiazem CD 240 mg daily   - Hx HLD - Crestor 5 mg daily   - Hx DMII   - Hx Thoracic ascending aortic aneurysm   - Hx Hepatitis C   - Hx LUE/Subclavian DVT in 01/2021- Eliquis.  Resolved on PVL 05/12/2021.  Eliquis held due to mild hemoptysis.   - Hx PTSD   - Hx Chronic  pain          PLAN:     -Off BiPAP this morning.  Doing well on nasal cannula.  Continue BiPAP nightly and as needed.  Titrate for keep sats more than 88%.        - Continue Duonebs q4, Singulair 10 mg daily and Solumedrol 40 mg IV q6.    - Continue Zosyn .  S/p Vancomycin.     -Continue Breo and Spiriva.      -Continue Norvasc and clonidine.  Continue hydralazine as needed.  Blood pressure more than 160.   - Avoid nephrotoxins. Renally dose medications. Strict I/Os. Daily weights.    - Glucommander per protocol. Goal BS 140-180.    - SUP: Protonix 40 mg IV daily.   - DVT prophylaxis: SCDs and SQ Heparin.   -Discussed  with RT, RN, patient.   -transfer to Telemetry.                  Subjective:    Breathing well this morning.  Off BiPAP this morning.  On nasal cannula.  Complains of mild headache.  No other focal deficits.   ROS:    As above. Rest negative.              Allergy:     Allergies        Allergen  Reactions         ?  Chocolate [Cocoa]  Sneezing             Confirms allergy but reports "I still eat it"            Vital Signs:       Visit Vitals   BP  (!) 167/90      Pulse  (!) 106      Temp  98.9 ??F (37.2 ??C)      Resp  24      Ht  5\' 7"  (1.702 m)      Wt  94.5 kg (208 lb 5.4 oz)      SpO2  96%      BMI  32.63 kg/m??                O2 Device: Nasal cannula     O2 Flow Rate (L/min): 3 l/min       Temp (24hrs), Avg:98.8 ??F (37.1 ??C), Min:98.6 ??F (37 ??C), Max:98.9 ??F (37.2 ??C)        Patient Vitals for the past 8 hrs:            Temp  Pulse  Resp  BP  SpO2            05/19/21 0830  --  (!) 106  24  (!) 167/90  96 %            05/19/21 0814  --  --  --  --  97 %     05/19/21 0700  --  87  22  --  96 %     05/19/21 0600  --  88  16  --  93 %     05/19/21 0530  --  (!) 102  19  (!) 157/95  94 %     05/19/21 0500  --  95  13  (!) 160/91  99 %     05/19/21 0430  --  81  20  (!) 166/96  97 %     05/19/21 0400  98.9 ??F (37.2 ??C)  72  17  (!) 159/81  97 %     05/19/21 0300  98.9 ??F (37.2 ??C)  80  11  (!) 155/81  93 %      05/19/21 0230  --  83  18  (!) 146/91  94 %     05/19/21 0200  --  73  21  (!) 158/90  98 %            05/19/21 0130  --  78  20  (!) 154/80  97 %              Intake/Output:    Last shift:      No intake/output data recorded.   Last 3 shifts: 12/05 1901 - 12/07 0700   In: 63.1 [I.V.:63.1]   Out: 2900 [Urine:2900]  Intake/Output Summary (Last 24 hours) at 05/19/2021 0905   Last data filed at 05/19/2021 0530     Gross per 24 hour        Intake  8.43 ml        Output  1500 ml        Net  -1491.57 ml                  Physical Exam:      GENERAL: As above.Marland Kitchen    NECK: Supple. Trachea midline.    RESPIRATORY: Bilateral BS present, decreased at bases. No rales or rhonchi.    CARDIOVASCULAR: S1 and S2 present, regular. No murmur, rub, or thrill.    ABDOMEN: Soft and nontender with positive bowel sounds. No organomegaly.    NEUROLOGICAL: As above.     EXT: No edema or cyanosis.          DATA:      Current Facility-Administered Medications          Medication  Dose  Route  Frequency           ?  metoclopramide HCl (REGLAN) injection 5 mg   5 mg  IntraVENous  Q6H     ?  fluticasone-vilanterol (BREO ELLIPTA) 244mcg-25mcg/puff   1 Puff  Inhalation  DAILY     ?  tiotropium bromide (SPIRIVA RESPIMAT) 2.5 mcg /actuation   2 Puff  Inhalation  DAILY     ?  cloNIDine HCL (CATAPRES) tablet 0.2 mg   0.2 mg  Oral  BID     ?  POTASSIUM ELECTROLYTE REPLACEMENT PROTOCOL STANDARD DOSING   1 Each  Other  PRN     ?  amLODIPine (NORVASC) tablet 10 mg   10 mg  Oral  DAILY     ?  methylPREDNISolone (PF) (SOLU-MEDROL) injection 40 mg   40 mg  IntraVENous  Q6H     ?  albuterol-ipratropium (DUO-NEB) 2.5 MG-0.5 MG/3 ML   3 mL  Nebulization  Q4H RT     ?  heparin (porcine) injection 5,000 Units   5,000 Units  SubCUTAneous  Q8H     ?  hydrALAZINE (APRESOLINE) 20 mg/mL injection 20 mg   20 mg  IntraVENous  Q6H PRN     ?  pantoprazole (PROTONIX) 40 mg in 0.9% sodium chloride 10 mL injection   40 mg  IntraVENous  DAILY     ?  dextrose (D50W)  injection syrg 5-25 g   10-50 mL  IntraVENous  PRN     ?  glucagon (GLUCAGEN) injection 1 mg   1 mg  IntraMUSCular  PRN     ?  insulin glargine (LANTUS) injection 1-100 Units   1-100 Units  SubCUTAneous  QHS     ?  insulin lispro (HUMALOG) injection 1-100 Units   1-100 Units  SubCUTAneous  Q6H     ?  docusate (COLACE) 50 mg/5 mL oral liquid 100 mg   100 mg  Oral  BID     ?  piperacillin-tazobactam (ZOSYN) 3.375 g in 0.9% sodium chloride (MBP/ADV) 100 mL MBP   3.375 g  IntraVENous  Q8H     ?  montelukast (SINGULAIR) tablet 10 mg   10 mg  Oral  QHS     ?  dextrose (D50W) injection syrg 10-15 g   20-30 mL  IntraVENous  PRN     ?  white petrolatum-mineral oiL (LACRILUBE S.O.P.) ointment 1 Each   1 Each  Both  Eyes  DAILY     ?  PHOSPHATE ELECTROLYTE REPLACEMENT PROTOCOL STANDARD DOSING   1 Each  Other  PRN     ?  MAGNESIUM ELECTROLYTE REPLACEMENT PROTOCOL STANDARD DOSING   1 Each  Other  PRN           ?  POTASSIUM ELECTROLYTE REPLACEMENT PROTOCOL STANDARD DOSING   1 Each  Other  PRN           ?  CALCIUM ELECTROLYTE REPLACEMENT PROTOCOL STANDARD DOSING   1 Each  Other  PRN                    Labs:   Reviewed.      Recent Results (from the past 24 hour(s))     GLUCOSE, POC          Collection Time: 05/18/21 12:05 PM         Result  Value  Ref Range            Glucose (POC)  158 (H)  65 - 105 mg/dL       POTASSIUM          Collection Time: 05/18/21  3:07 PM         Result  Value  Ref Range            Potassium  3.3 (L)  3.5 - 5.1 mEq/L       GLUCOSE, POC          Collection Time: 05/18/21  5:48 PM         Result  Value  Ref Range            Glucose (POC)  186 (H)  65 - 105 mg/dL       GLUCOSE, POC          Collection Time: 05/19/21 12:23 AM         Result  Value  Ref Range            Glucose (POC)  220 (H)  65 - 105 mg/dL       CBC WITH AUTOMATED DIFF          Collection Time: 05/19/21  5:34 AM         Result  Value  Ref Range            WBC  22.9 (H)  4.0 - 11.0 1000/mm3       RBC  4.23  3.60 - 5.20 M/uL       HGB  11.3  (L)  13.0 - 17.2 gm/dl       HCT  78.2 (L)  95.6 - 50.0 %       MCV  86.1  80.0 - 98.0 fL       MCH  26.7  25.4 - 34.6 pg       MCHC  31.0  30.0 - 36.0 gm/dl       PLATELET  213  086 - 450 1000/mm3       MPV  12.5 (H)  6.0 - 10.0 fL       ATYPICAL LYMPHS  1.0 (H)  0 - 0 %       RDW-SD  49.2 (H)  36.4 - 46.3         NRBC  0  0 - 0         Anisocytosis  1          IMMATURE GRANULOCYTES  0.7  0.0 - 3.0 %       HYPOCHROMASIA  1          Polychromasia  1          NEUTROPHILS  93.0 (H)  34 - 64 %       LYMPHOCYTES  1.0 (L)  28 - 48 %       MONOCYTES  5.1  1 - 13 %       EOSINOPHILS  0.0  0 - 5 %       BASOPHILS  0.0  0 - 3 %       Giant platelets  1.0          PLATELET COMMENTS  NORMAL          Stomatocytes  OCCASIONAL          GLUCOSE, POC          Collection Time: 05/19/21  6:24 AM         Result  Value  Ref Range            Glucose (POC)  241 (H)  65 - 105 mg/dL             Imaging:   Personally reviewed.                Zoe Lan, MD         Dragon medical dictation software was used for portions of this report. Unintended voice transcription errors may have occurred.

## 2021-05-19 NOTE — Progress Notes (Signed)
BiPAP Progress Note    Patient currently is Off BiPAP  Patient off  BiPAP for the AM shift    BiPAP QHS    BiPAP settings are:  Bio-Med ID #: [id number]  IPAP:  18 cm H2O   EPAP: 10 cm H2O  Rate:   10  FiO2:   30 %     BiPAP will be removed from room after 48 hours of non-use
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
## 2021-05-19 NOTE — Progress Notes (Signed)
6:01 AM  Called to beside by nurse because patient c/o frontal headache, cannot quantify and without any other symptoms such as numbness, tingling, vision changes or loss or double, neck pain,dizziness, lightheadedness, Shortness of breath.  Headache cocktail ordered and will send down for CT of the head.  Patient appears uncomfortable.  States that she woke up with headache.  No prior history of headaches.

## 2021-05-19 NOTE — Progress Notes (Signed)
Problem: Dysphagia (Adult)  Goal: *Acute Goals and Plan of Care (Insert Text)  Description: Reassessment due: 05/26/21  Goals:  Patient will:  1. Tolerate recommended diet with </= 1 overt/ soft/ silent s/s aspiration or distress b/s for adequate po nutrition/ hydration  2. Tolerate diet upgrades with </= 1 overt/ soft/ silent s/s aspiration or distress b/s for adequate po nutrition/ hydration  3. Utilize compensatory swallow strategies to improve swallow safety and decrease risks of aspiration/ penetration, independently   Outcome: Progressing Towards Goal

## 2021-05-19 NOTE — Progress Notes (Signed)
 Progress Notes by Lavella Nat BROCKS, PT at 05/19/21 1134                Author: Lavella Nat BROCKS, PT  Service: Physical Therapy  Author Type: Physical Therapist       Filed: 05/19/21 1208  Date of Service: 05/19/21 1134  Status: Signed          Editor: Lavella Nat BROCKS, PT (Physical Therapist)                  PHYSICAL THERAPY EVALUATION:                 Patient: [name] [name] (70 y.o. female)   Room: 4309/4309   [x]   Patient DOB Verified      Date of Admission: 05/11/2021    Length of Stay:  8 day(s)   Primary Diagnosis: Respiratory failure (HCC) [J96.90]   Procedure(s) (LRB):   BRONCHOSCOPY Bedside ICU-9 (N/A) 5 Days Post-Op    Insurance: Payor: [employer] MEDICARE / Plan: [facility] [employer] MEDICARE HMO / Product Type: Managed Care Medicare /        Date: 05/19/2021   In time: 10:59  Out time:  11:22         Precautions: Falls. Aspiration. O2 increased from 3.5L to 5L per RT guidance prior to initiation of  session.           Isolation:   There are currently no Active Isolations       MDRO: No active infections        Equipment: No DME needed         Assessment:         Based on the objective data described below, the patient presents with   - ICU Mobility Level:  1 - Sitting in bed, exercises in bed   - decreased mobility affecting function   - decreased independence with functional mobility   - decreased tolerance to sustained activity   - active participation in bed mobility training, education for positioning, therapeutic exercises, therapeutic activities, patient education, caregiver education.   - min A x 1 rolling Left, mod/max A x 1 rolling Right   - max A x 2 to scoot up in the bed   - non-ambulatory, bed bound since admission in 01/2021   - motivated to participate in PT to improve functional activity tolerance and return to prior level of function      Functional Status Score for the Intensive Care Unit (FSS-ICU)      Task   Score     1.  Rolling  3 - Mod assist (patient performs 26%-74% of the work)      2. Supine to Sit Transfer  2 - Max assist (patient performs 25% or less of the work)     3. Sit to Stand Transfer  0 - Unable to complete due to weakness     4. Sitting Edge of Bed  0 - Unable to complete due to weakness     5. Walking  0 - Unable to attempt due to weakness     TOTAL SCORE:  5/35        INITIAL TOTAL SCORE:                Patients rehabilitation potential for below stated goals: Good.        Recommendations:   Recommend continued physical therapy during acute stay. Occupational Therapy.  Recommend out of bed activity to counteract ill effects  of bedrest, with assistance from staff as needed.   Discharge Recommendations: Patient does not want to go to rehab facility, Home with family support and HHPT, Recommend home health PTand family/caregiver  support to increase overall strength, activity tolerance, standing balance/tolerance, functional mobility, safety awareness and overall self care independence.   Further Equipment Recommendations for Discharge: No needs anticipated; has DME available at home: rolling walker, wheelchair, hospital bed, bedside  commode, shower chair.          Plan:         Patient will benefit from skilled Physical Therapy intervention to address the above impairments to return to prior level of function. Patient will be seen 1-5 times/week for at least 1 week.      Patient will achieve PT goals in 1 weeks. Goals were created with input from the patient,  patient's spouse.        Physical Therapy Goals:        - Patient will be min. assist x1 with bed mobility in preparation for EOB activities.   - Patient will be mod. assist x1 with transfers in preparation for OOB activities and ambulation.   - Patient will demonstrate good sitting balance to safely enable upright activities and reduce risk for falls.   - Patient will demonstrate good activity tolerance during functional activities.   - Patient will demonstrate good safety awareness during functional activities.   - Patient  will improve FSS-ICU score to at least 9/35 in order to indicate improved functional independence.   - Patient will be modified independent with lower extremity home exercise program to increase strength and endurance.   - Patient will utilize energy conservation techniques during functional activities.        Planned interventions:         Skilled Physical Therapy services will provide functional mobility training, therapeutic exercises, therapeutic activities, patient/caregiver education as indicated.   Skilled Physical Therapy services will modify and progress therapeutic interventions, address functional mobility deficits, address ROM and strength deficits, analyze and cue movement  patterns and assess and modify postural abnormalities to reach the stated goals.        Subjective:         Patient agreeable to PT/OT co-evaluation to maximize safety and mobility as patient with poor activity tolerance. PT emphasis on balance and mobility and OT focus on ADLs. Patient pleasant and cooperative with therapy. Can I go home now?   Patient reports 0/10 pain before treatment and 0/10 pain at conclusion of treatment.        Objective Data Summary:         Orders, labs, and chart reviewed on [facility]. Communicated with patient's nurse (patient ok to be seen by PT), patient's spouse at bedside.       Present illness history:      Patient Active Problem List           Diagnosis  Date Noted         ?  Respiratory failure (HCC)  05/11/2021     ?  Acute CHF (congestive heart failure) (HCC)  01/24/2021     ?  UTI (urinary tract infection)  06/01/2019     ?  COPD exacerbation (HCC)  05/07/2019     ?  Acute hypoxemic respiratory failure (HCC)  05/07/2019     ?  Chronic respiratory failure with hypoxia (HCC)  08/22/2018     ?  Headache  08/05/2018     ?  Abdominal pain  02/04/2018     ?  SIRS (systemic inflammatory response syndrome) (HCC)  10/09/2017     ?  Hyperkalemia  09/08/2017     ?  Obtunded  09/08/2017     ?  Acute  renal failure (ARF) (HCC)  09/08/2017     ?  Septic shock (HCC)  09/08/2017     ?  Metabolic encephalopathy  09/08/2017     ?  Dehydration  08/07/2017     ?  COPD with acute exacerbation (HCC)  08/07/2017     ?  Acute-on-chronic kidney injury (HCC)  08/07/2017     ?  Chest pain  06/15/2017     ?  Dyspnea  06/15/2017     ?  Type 2 diabetes mellitus with diabetic neuropathy (HCC)  12/07/2016     ?  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  Cannabinoid hyperemesis syndrome  08/17/2016     ?  Gastritis  08/16/2016     ?  Nausea & vomiting  08/16/2016     ?  Asthma with acute exacerbation  08/04/2016     ?  Lactic acidosis  07/24/2016     ?  Leukocytosis  07/24/2016     ?  Sepsis (HCC)  07/24/2016     ?  Asthma exacerbation  07/24/2016     ?  Type 2 diabetes mellitus with nephropathy (HCC)  06/12/2016     ?  Sacroiliitis (HCC)  11/25/2015     ?  Spondylosis of lumbar region without myelopathy or radiculopathy  09/15/2015     ?  Lumbar and sacral osteoarthritis  09/15/2015     ?  Chronic pain syndrome  09/15/2015     ?  Uncontrolled type 2 diabetes mellitus with hyperglycemia (HCC)  08/20/2015     ?  Acute colitis  07/02/2015     ?  Acute hyperglycemia  07/02/2015     ?  Accelerated hypertension  04/08/2015     ?  Severe headache  04/07/2015     ?  Osteoarthritis of hips, bilateral  01/29/2015     ?  PTSD (post-traumatic stress disorder)  01/29/2015         ?  Severe hypertension  07/21/2014         Previous medical history:      Past Medical History:        Diagnosis  Date         ?  Arthritis       ?  Chronic pain syndrome            related to R hip replacement         ?  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  DM2 (diabetes mellitus, type 2) (HCC)       ?  GERD (gastroesophageal reflux disease)       ?  Hepatitis C            HEP C         ?  HTN (hypertension)       ?  Lung nodule, multiple            (CT 10/2016) Small left upper lobe and 1.7 x  1.6 cm left lower lobe nodules not significantly changed from 07/23/2016 and 03/22/2016         ?  Marijuana use       ?  PTSD (post-traumatic stress disorder)            lived through the [place]         ?  Thoracic ascending aortic aneurysm (HCC)            4.4cm noted on CT Chest (10/2016)         ?  Thyroid nodule            2.5 cm stable right thyroid nodule (CT 10/2016)            Prior Level of Function/Home Situation:    Information was obtained by patient and patient's spouse at bedside   Home environment:  Patient lives with spouse in a 2 story townhouse. Bedroom is on the second floor. There is a stair lift to the 2nd floor.    Prior level of function: needs assistance with ADLs, meals and housekeeping provided, bed bound, Per patient's spouse who serves as patient's caregiver,  she has bed primarily bed bound since her last admission. She worked on bear hug transfers initially but ultimately it has become too difficult for the spouse to perform and therefore she has only mobilized to her w/c 2x since previous admission and  has been bed bound since previous admission.     Home equipment: rolling walker, wheelchair, hospital bed, bedside commode, shower chair        Patient found:        Bed, (+) bed/chair exit alarm, (+) oxygen, (+) IV, (+) ICU/step down equipment, (+) visitor: spouse   Most recent value for oxygen in flow sheets:   O2 Device: Nasal cannula (05/19/21 0814)   O2 Flow Rate (L/min): 3 l/min (05/19/21 0500)      Patient received/participated in 15 minutes of treatment (bed mobility training, education for positioning, therapeutic exercises, therapeutic activities, patient education) during /immediately following PT evaluation.        Cognitive Status:        Mental Status: Alert and oriented x 3   Communication: soft spoken   Follows commands: requires cues to stay on task   General Cognition: grossly intact   Safety/Judgement: needs cueing for safety and  precautions        Extremities Assessment:         Sensation:   Intact to light touch BLE      Strength:     Generalized weakness, grossly 4/5 RLE & 3+/5 LLE       Range Of Motion:   [facility] BLE        Therapeutic Activities; Functional Mobility and Balance Status:         Focus on bed mobility training, education for positioning, therapeutic exercises, therapeutic activities, patient education. Provided cueing for hand placement, technique, and safety.          Bed Mobility:      Functional Status     Comments         Supine to long sit  mod/max assist/1 person assist, verbal cues, set-up, requires additional time, hand held assist       Sit to supine  max assist, verbal cues, requires additional time       Rolling  min/mod assist/1 person assist, 2nd person assist for safety considerations, verbal cues, tactile cues, set-up, requires additional time, use of bed rails  - min A rolling Left   - mod/max A rolling Right  Scooting  max assist/2 person assist, verbal cues, set-up, bed flat, use of draw sheet              Transfers:      Functional Status     Comments         Sit to stand  not tested, unable           Stand to sit  not tested, unable              Ambulation/Gait Training:   Not tested, unable, bed bound at baseline            Functional Balance       Unable to accurately assess, requires full support of the bed at this time          Therapeutic Exercises:            Therapeutic Exercises   performed a few reps of each, rest breaks provided, abbreviated by generalized weakness & fatigue,  gentle AA/AROM BLE, needs verbal cues to stay on task, needs tactile cues to stay on task:    ankle pumps, heel slides in supine, hip ab/duction in supine, straight leg raise, glute sets, quad sets, bridging          Activity Tolerance:        - requires increased time, rest breaks and repeated attempts with functional tasks   - generalized fatigue   - generalized muscle weakness   - activity tolerance limited by  deconditioning   - requires oxygen   - vitals stable   - no apparent distress        Final Location:        Positioned in bed, all needs within reach. Patient agrees to call for assistance. Head of bed elevated. As found. Positioned with pillows for comfort. (+) bed alarm. (+) O2. (+) nurse notified. (+)  spouse present. Patient watching tv.        Communication/Education:        Education: benefits of activity, call staff for assistance, safety, HEP, ankle pumps to promote improved circulation and prevent DVTs, positioning,  DME, disposition, role of PT, PT plan of care, all questions answered.      Education provided to: patient and spouse    Opportunity for questions and clarification was provided.      Readiness to learn indicated by: verbalized understanding, trying to perform skills, asking questions, and showing interest      Barriers to learning/limitations:  None      Comprehension: Patient communicated comprehension and In agreement         Thank you for this referral.   Nat JAYSON Friedman, PT, DPT

## 2021-05-19 NOTE — Progress Notes (Signed)
Progress Notes by Salley Slaughter, MD at 05/19/21 0102                Author: Salley Slaughter, MD  Service: Hospitalist  Author Type: Physician       Filed: 05/19/21 1855  Date of Service: 05/19/21 0852  Status: Signed          Editor: Salley Slaughter, MD (Physician)                          INTERNAL MEDICINE PROGRESS NOTE      Date of note:      May 19, 2021      Patient:               [name] [name], 70 y.o., female   Admit Date:        05/11/2021   Length of Stay:  8 day(s)      Problem List:      Patient Active Problem List        Diagnosis  Code         ?  Severe hypertension  I10     ?  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  PTSD (post-traumatic stress disorder)  F43.10     ?  Severe headache  R51.9     ?  Accelerated hypertension  I10     ?  Acute colitis  K52.9     ?  Acute hyperglycemia  R73.9     ?  Uncontrolled type 2 diabetes mellitus with hyperglycemia (HCC)  E11.65     ?  Spondylosis of lumbar region without myelopathy or radiculopathy  M47.816     ?  Lumbar and sacral osteoarthritis  M47.817     ?  Chronic pain syndrome  G89.4     ?  Sacroiliitis (HCC)  M46.1     ?  Type 2 diabetes mellitus with nephropathy (HCC)  E11.21     ?  Lactic acidosis  E87.20     ?  Leukocytosis  D72.829     ?  Sepsis (HCC)  A41.9     ?  Asthma exacerbation  J45.901     ?  Asthma with acute exacerbation  J45.901     ?  Gastritis  K29.70     ?  Nausea & vomiting  R11.2     ?  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  Cannabinoid hyperemesis syndrome  R11.2, F12.90     ?  Type 2 diabetes mellitus with diabetic neuropathy (HCC)  E11.40     ?  Chest pain  R07.9     ?  Dyspnea  R06.00     ?  Dehydration  E86.0     ?  COPD with acute exacerbation (HCC)  J44.1     ?  Acute-on-chronic kidney injury (HCC)  N17.9, N18.9     ?  Hyperkalemia  E87.5     ?  Obtunded  R40.1     ?  Acute renal failure (ARF) (HCC)  N17.9     ?  Septic shock (HCC)  A41.9, R65.21     ?  Metabolic encephalopathy  G93.41      ?  SIRS (systemic inflammatory response syndrome) (HCC)  R65.10     ?  Abdominal pain  R10.9     ?  Headache  R51.9     ?  Chronic respiratory failure with hypoxia (HCC)  J96.11     ?  COPD exacerbation (HCC)  J44.1     ?  Acute hypoxemic respiratory failure (HCC)  J96.01     ?  UTI (urinary tract infection)  N39.0     ?  Acute CHF (congestive heart failure) (HCC)  I50.9         ?  Respiratory failure (HCC)  J96.90             Subjective + interval history        Patient has been on 3 L NC overnight. Saturating 94%   Had severe headache this morning- got headache cocktail.    Still has headache at the time of my exam. No other focal deficit        Assessment :         Acute on chronic hypoxic hypercapnic respiratory failure requiring intubation on 05/11/21   Community-acquired pneumonia with left mucous plugging/atelectasis, s/p bronchoscopy with 05/16/21- mild secretions, irritation left airways, no mucous plug, dynamic collapse Sup segt LLL -> cultures negative, cytology negative for malignant cells   Blood cultures 1 out of 2 positive for Corynebacterium, likely procurement contamination   Hemoptysis secondary to pneumonia   Acute COPD exacerbation - on 3L supplemental oxygen at baseline   Bullous emphysema   Hyperkalemia   Leukocytosis,    Bullous emphysema  Hypertension  Dyslipidemia  Type 2 diabetes mellitus  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 Reactive leukocytosis, monitor   Continue nebulized bronchodilator every 6 hours, Solu-Medrol  40 mg every 6 hours.     Try to start weaning steroid tomorrow   Continue Singulair 10 mg daily.  Continue  Zosyn for total 7 days-  bronch cultures are negative so far      CT head ordered due to severe headache. No acute change. Patient was reassured.      PT OT as tolerated          DVT ppx : Subcutaneous heparin ( left upper and bilateral lower extremity venous PVL was negative for DVT ) - eliquis was held for bronchoscopy and due to mild hemoptysis  GI prophylaxis -Protonix       Discussed with  : RN at bedsode       - Code status: full code       Transfer to tele        Objective:           Visit Vitals      BP  (!) 157/95     Pulse  87     Temp  98.9 ??F (37.2 ??C)     Resp  22     Ht   (1.702 m)     Wt  94.5 kg (208 lb 5.4 oz)     SpO2  97%        BMI  32.63 kg/m??  Intake/Output Summary (Last 24 hours) at 05/19/2021 9629   Last data filed at 05/19/2021 0530     Gross per 24 hour        Intake  15.53 ml        Output  2000 ml        Net  -1984.47 ml             Physical Exam:     GEN -alert, oriented to self and place, anxious due to headache   HEENT - mucous membranes moist   Neck - supple, no JVD   Cardiac - RRR, S1, S2, no murmurs   Chest/Lungs - mild expiratory wheezing, mild crackles at lung bases   Abdomen - soft, obese, non tender   Extremities - no clubbing/ cyanosis/ edema   Neuro -follows commands, moves all 4 extremities   Skin - no rashes or lesions        Current medications:           Current Facility-Administered Medications:    ?  metoclopramide HCl (REGLAN) injection 5 mg, 5 mg, IntraVENous, Q6H, Pinera, Claire, PA, 5 mg at 05/19/21 5284   ?  fluticasone-vilanterol (BREO ELLIPTA) 216mcg-25mcg/puff, 1 Puff, Inhalation, DAILY, Zoe Lan, MD, 1 Puff at 05/18/21 1615   ?  tiotropium bromide (SPIRIVA RESPIMAT) 2.5 mcg Salvadore Farber, 2 [place], Inhalation, DAILY, Zoe Lan, MD, 1 Puff at 05/18/21 1615   ?  cloNIDine HCL (CATAPRES) tablet 0.2 mg, 0.2 mg, Oral, BID, Zoe Lan, MD, 0.2 mg at 05/18/21 2150   ?  POTASSIUM ELECTROLYTE REPLACEMENT PROTOCOL STANDARD DOSING, 1  Each, Other, PRN, Darrill Vreeland S, MD   ?  amLODIPine (NORVASC) tablet 10 mg, 10 mg, Oral, DAILY, Lauralee Evener, PA-C, 10 mg at 05/18/21 1707   ?  methylPREDNISolone (PF) (SOLU-MEDROL) injection 40 mg, 40 mg, IntraVENous, Q6H, Faythe Ghee C, MD, 40 mg at 05/19/21 0523   ?  albuterol-ipratropium (DUO-NEB) 2.5 MG-0.5 MG/3 ML, 3 mL, Nebulization, Q4H RT, Faythe Ghee C, MD, 3 mL at 05/19/21 0813   ?  heparin (porcine) injection 5,000 Units, 5,000 Units, SubCUTAneous, Q8H, Imad, Melhem, MD, 5,000 Units at 05/19/21 0523   ?  hydrALAZINE (APRESOLINE) 20 mg/mL injection 20 mg, 20 mg, IntraVENous, Q6H PRN, Lottie Dawson, NP, 20 mg at 05/19/21 0443   ?  pantoprazole (PROTONIX) 40 mg in 0.9% sodium chloride 10 mL injection, 40 mg, IntraVENous, DAILY, Lottie Dawson, NP, 40 mg at 05/18/21  1324   ?  dextrose (D50W) injection syrg 5-25 g, 10-50 mL, IntraVENous, PRN, Lottie Dawson, NP   ?  glucagon (GLUCAGEN) injection 1 mg, 1 mg, IntraMUSCular, PRN, Lottie Dawson, NP   ?  insulin glargine (LANTUS) injection 1-100 Units, 1-100 Units, SubCUTAneous, QHS, Lottie Dawson, NP, 12 Units at 05/18/21 2153   ?  insulin lispro (HUMALOG) injection 1-100 Units, 1-100 Units, SubCUTAneous, Q6H, Lottie Dawson, NP, 1 Units at 05/19/21 4010   ?  docusate (COLACE) 50 mg/5 mL oral liquid 100 mg, 100 mg, Oral, BID, Imad, Melhem, MD, 100 mg at 05/18/21 2150   ?  piperacillin-tazobactam (ZOSYN) 3.375 g in 0.9% sodium chloride (MBP/ADV) 100 mL MBP, 3.375 g, IntraVENous, Q8H, Erling Conte, MD, Last  Rate: 25 mL/hr at 05/19/21 0121, 3.375 g at 05/19/21 0121   ?  montelukast (SINGULAIR) tablet 10 mg, 10 mg, Oral, QHS, Lauralee Evener, PA-C, 10 mg at 05/18/21 2154   ?  dextrose (D50W) injection  syrg 10-15 g, 20-30 mL, IntraVENous, PRN, Lauralee Evener, PA-C   ?  white petrolatum-mineral oiL (LACRILUBE S.O.P.) ointment 1 Each, 1 Each, Both Eyes, DAILY, Erling Conte, MD, 1 Each at 05/18/21 [id number]   ?  PHOSPHATE ELECTROLYTE  REPLACEMENT PROTOCOL STANDARD DOSING, 1 Each, Other, PRN, Coulter, Venetia Night, NP   ?  MAGNESIUM ELECTROLYTE REPLACEMENT PROTOCOL STANDARD DOSING, 1 Each, Other, PRN, Coulter, Venetia Night, NP   ?  POTASSIUM ELECTROLYTE REPLACEMENT PROTOCOL STANDARD DOSING, 1 Each, Other, PRN, Coulter, Venetia Night, NP   ?  CALCIUM ELECTROLYTE REPLACEMENT PROTOCOL STANDARD DOSING, 1 Each, Other, PRN, Donnajean Lopes, NP, 1 Each at 05/13/21 1400        Labs:          Recent Results (from the past 24 hour(s))     GLUCOSE, POC          Collection Time: 05/18/21 12:05 PM         Result  Value  Ref Range            Glucose (POC)  158 (H)  65 - 105 mg/dL       POTASSIUM          Collection Time: 05/18/21  3:07 PM         Result  Value  Ref Range            Potassium  3.3 (L)  3.5 - 5.1 mEq/L       GLUCOSE, POC          Collection Time: 05/18/21  5:48 PM         Result  Value  Ref Range            Glucose (POC)  186 (H)  65 - 105 mg/dL       GLUCOSE, POC          Collection Time: 05/19/21 12:23 AM         Result  Value  Ref Range            Glucose (POC)  220 (H)  65 - 105 mg/dL       CBC WITH AUTOMATED DIFF          Collection Time: 05/19/21  5:34 AM         Result  Value  Ref Range            WBC  22.9 (H)  4.0 - 11.0 1000/mm3       RBC  4.23  3.60 - 5.20 M/uL       HGB  11.3 (L)  13.0 - 17.2 gm/dl       HCT  84.1 (L)  32.4 - 50.0 %       MCV  86.1  80.0 - 98.0 fL       MCH  26.7  25.4 - 34.6 pg       MCHC  31.0  30.0 - 36.0 gm/dl       PLATELET  401  027 - 450 1000/mm3       MPV  12.5 (H)  6.0 - 10.0 fL       ATYPICAL LYMPHS  1.0 (H)  0 - 0 %       RDW-SD  49.2 (H)  36.4 - 46.3         NRBC  0  0 - 0         Anisocytosis  1  IMMATURE GRANULOCYTES  0.7  0.0 - 3.0 %       HYPOCHROMASIA  1          Polychromasia  1          NEUTROPHILS  93.0 (H)  34 - 64 %       LYMPHOCYTES  1.0 (L)  28 - 48 %       MONOCYTES  5.1  1 - 13 %       EOSINOPHILS  0.0  0 - 5 %       BASOPHILS  0.0  0 - 3 %       Giant platelets  1.0          PLATELET  COMMENTS  NORMAL          Stomatocytes  OCCASIONAL          GLUCOSE, POC          Collection Time: 05/19/21  6:24 AM         Result  Value  Ref Range            Glucose (POC)  241 (H)  65 - 105 mg/dL           XR Results:   Results from Hospital Encounter encounter on 05/11/21      XR CHEST SNGL V      Narrative   Chest      Indication: SOB   Comparison: 05/17/2021      Findings:      AP portable upright view of the chest demonstrates that the cardiac silhouette   is not  enlarged. Stable left upper lobe bulla. Interval worsening of right   lower lobe airspace disease. Stable left mid and basilar airspace disease.      Impression   Impression:      Worsening right lower lobe airspace disease. Stable left lung airspace disease      Electronically signed by: Jodi Mourning, MD 05/18/2021 9:42 AM EST         CT Results:   Results from Hospital Encounter encounter on 05/11/21      CT CHEST W CON PE PROTOCOL      Narrative   All CT exams at this facility use one or more dose reduction techniques   including automatic exposure control, mA/kV adjustment per patient's size, or   iterative reconstruction technique.      Clinical History: rule out PE.      Examination:   CTA CHEST with contrast. 3 mm spiral scanning was performed from the lung apices   to the upper pole of the kidneys. 3D MIP coronal and sagittal reconstruction   imaging has been performed.      Correlation:   08/30/2018, 02/18/2018 and 07/03/2015      Findings:      Degenerative changes of the spine. Secretions dependent portions of the trachea.   Dense airspace opacity with air bronchograms left upper lobe. There are   bilateral bullae. Dependent atelectasis lung bases.      2 cm left lower lobe nodule slightly increased in size from 08/30/2018, 02/18/2018   and 07/02/2025 ((prior measurement 1.7, 1.9 and 1.3 cm). No dissection thoracic   aorta. Ascending aorta measures 4.7 cm unchanged from prior studies. The   esophagus is not dilated.      No lymph node enlargement  in the axilla, mediastinum or hila. No pulmonary   embolism. Visualized portions of the liver, pancreas, adrenal glands are  unremarkable. 2.3 cm splenic lesion unchanged from 02/18/2018. 1.8 cm midpole   right renal hypodensity, ultrasound characterization suggested.      Impression   IMPRESSION:   1. No pulmonary embolism.   2. Bilateral bullae   3. Left upper lobe pneumonia/volume loss. Radiographic follow-up to resolution   suggested.   4. 2 cm left lower lobe nodule not significantly changed from prior studies.   5. 2.3 cm stable splenic lesion, unchanged from 02/18/2018.   6. 1.8 cm probable cyst midpole right kidney, ultrasound characterization   suggested.      Electronically signed by: Jolayne Panther, MD 05/11/2021 10:11 PM EST            IR Results:   No results found for this or any previous visit.         VAS/US Results:   Results from Hospital Encounter encounter on 05/11/21      DUPLEX LOWER EXT VENOUS BILAT      Narrative   [id number]   [id number]   [id number]   Study ID: [id number]      [facility]   [address] [place],   [place]   [address]      Lower Extremity Venous Report      Name: LONNA, RABOLD Date: 05/17/2021 09:32 AM   MRN: [id number]                   Patient Location: [id number]   DOB: 09-11-1950               Age: 78 yrs   Gender: Female                Account #: [id number]   Reason For Study: acute hypoxic respiratory failure. ICD-10   J96.01   Ordering Physician: Vickey Huger   Referring Physician: [name]   Performed By: Miles Costain, RVT      Interpretation Summary   1.No evidence of deep vein thrombosis in the bilateral lower extremities.   2. In comparison to exam perfomed on 08/22/2019, no significant change is   noted.   ___________________________________________________________________________   QUALITY/PROCEDURE   Complete bilateral venous duplex performed.      HISTORY/SYMPTOMS   Diabetes. COPD. HTN. previous left  subclavian DVT.      RIGHT LEG   The right common femoral, femoral, popliteal, posterior tibial and   peroneal veins were examined with duplex ultrasound.   The deep veins were patent and compressible with no evidence of   intraluminal thrombus. Spontaneous, phasic venous   flow with normal augmentation was noted throughout the right leg.      RIGHT SAPHENOUS VEINS   Right great saphenous vein at the saphenofemoral junction demonstrates   normal compressibility with spontaneous and   phasic venous hemodynamics.      LEFT LEG   The left common femoral, femoral, popliteal, posterior tibial and peroneal   veins were examined with duplex ultrasound.   The deep veins were patent and compressible with no evidence of   intraluminal thrombus. Spontaneous, phasic venous   flow with normal augmentation was noted throughout the left leg.      LEFT LEG SAPHENOUS VEINS   Left great saphenous vein at the saphenofemoral junction demonstrates   normal compressibility with spontaneous and   phasic venous hemodynamics.      Electronically signed  byDr. Neta Ehlers, M.D   05/17/2021 12:00 PM            Total clinical care time was 38  minutes of which more than 50% was spent in coordination of care and counseling (time spent with patient/family face to face, physical exam, reviewing laboratory and imaging investigations, speaking with physicians and  nursing staff involved in this patient's care).       Dragon medical dictation system was used for part of this note. Unintentional voice recognition errors may occur      Tia Masker,  MD   [facility]   May 19, 2021   Time: 8:52 AM

## 2021-05-20 LAB — BASIC METABOLIC PANEL
Anion Gap: 7 mmol/L (ref 5–15)
BUN: 16 mg/dl (ref 9–23)
CO2: 34 mEq/L — ABNORMAL HIGH (ref 20–31)
Calcium: 9 mg/dl (ref 8.7–10.4)
Chloride: 100 mEq/L (ref 98–107)
Creatinine: 0.54 mg/dl — ABNORMAL LOW (ref 0.55–1.02)
EGFR IF NonAfrican American: >60
GFR African American: >60
Glucose: 212 mg/dl — ABNORMAL HIGH (ref 74–106)
Potassium: 3.3 mEq/L — ABNORMAL LOW (ref 3.5–5.1)
Sodium: 141 mEq/L (ref 136–145)

## 2021-05-20 LAB — CBC WITH AUTO DIFFERENTIAL
Basophils %: 0.1 % (ref 0–3)
Eosinophils %: 0 % (ref 0–5)
Hematocrit: 33 % — ABNORMAL LOW (ref 37.0–50.0)
Hemoglobin: 10.2 gm/dl — ABNORMAL LOW (ref 13.0–17.2)
Immature Granulocytes: 0.8 % (ref 0.0–3.0)
Lymphocytes %: 3.2 % — ABNORMAL LOW (ref 28–48)
MCH: 27.1 pg (ref 25.4–34.6)
MCHC: 30.9 gm/dl (ref 30.0–36.0)
MCV: 87.5 fL (ref 80.0–98.0)
MPV: 12.4 fL — ABNORMAL HIGH (ref 6.0–10.0)
Monocytes %: 7.1 % (ref 1–13)
Neutrophils %: 88.8 % — ABNORMAL HIGH (ref 34–64)
Nucleated RBCs: 0 (ref 0–0)
Platelets: 226 10*3/uL (ref 140–450)
RBC: 3.77 M/uL (ref 3.60–5.20)
RDW-SD: 50.4 — ABNORMAL HIGH (ref 36.4–46.3)
WBC: 17.6 10*3/uL — ABNORMAL HIGH (ref 4.0–11.0)

## 2021-05-20 LAB — POCT GLUCOSE
POC Glucose: 260 mg/dL — ABNORMAL HIGH (ref 65–105)
POC Glucose: 283 mg/dL — ABNORMAL HIGH (ref 65–105)
POC Glucose: 288 mg/dL — ABNORMAL HIGH (ref 65–105)

## 2021-05-20 LAB — COVID-19: COVID-19: NEGATIVE

## 2021-05-20 LAB — CBC WITH AUTOMATED DIFF
BASOPHILS: 0.1 % (ref 0–3)
EOSINOPHILS: 0 % (ref 0–5)
HCT: 33 % — ABNORMAL LOW (ref 37.0–50.0)
HGB: 10.2 gm/dl — ABNORMAL LOW (ref 13.0–17.2)
IMMATURE GRANULOCYTES: 0.8 % (ref 0.0–3.0)
LYMPHOCYTES: 3.2 % — ABNORMAL LOW (ref 28–48)
MCH: 27.1 pg (ref 25.4–34.6)
MCHC: 30.9 gm/dl (ref 30.0–36.0)
MCV: 87.5 fL (ref 80.0–98.0)
MONOCYTES: 7.1 % (ref 1–13)
MPV: 12.4 fL — ABNORMAL HIGH (ref 6.0–10.0)
NEUTROPHILS: 88.8 % — ABNORMAL HIGH (ref 34–64)
NRBC: 0 (ref 0–0)
PLATELET: 226 10*3/uL (ref 140–450)
RBC: 3.77 M/uL (ref 3.60–5.20)
RDW-SD: 50.4 — ABNORMAL HIGH (ref 36.4–46.3)
WBC: 17.6 10*3/uL — ABNORMAL HIGH (ref 4.0–11.0)

## 2021-05-20 LAB — METABOLIC PANEL, BASIC
Anion gap: 7 mmol/L (ref 5–15)
BUN: 16 mg/dl (ref 9–23)
CO2: 34 mEq/L — ABNORMAL HIGH (ref 20–31)
Calcium: 9 mg/dl (ref 8.7–10.4)
Chloride: 100 mEq/L (ref 98–107)
Creatinine: 0.54 mg/dl — ABNORMAL LOW (ref 0.55–1.02)
GFR est AA: >60
GFR est non-AA: >60
Glucose: 212 mg/dl — ABNORMAL HIGH (ref 74–106)
Potassium: 3.3 mEq/L — ABNORMAL LOW (ref 3.5–5.1)
Sodium: 141 mEq/L (ref 136–145)

## 2021-05-20 LAB — COVID-19 (INPATIENT TESTING): COVID-19: NEGATIVE

## 2021-05-20 LAB — GLUCOSE, POC
Glucose (POC): 260 mg/dL — ABNORMAL HIGH (ref 65–105)
Glucose (POC): 283 mg/dL — ABNORMAL HIGH (ref 65–105)
Glucose (POC): 288 mg/dL — ABNORMAL HIGH (ref 65–105)

## 2021-05-20 MED ORDER — POTASSIUM CHLORIDE SR 20 MEQ TAB, PARTICLES/CRYSTALS
20 mEq | Freq: Two times a day (BID) | ORAL | Status: AC
Start: 2021-05-20 — End: 2021-05-20
  Administered 2021-05-20 – 2021-05-21 (×2): via ORAL

## 2021-05-20 MED ORDER — PREDNISONE 20 MG TAB
20 mg | Freq: Every day | ORAL | Status: AC
Start: 2021-05-20 — End: 2021-05-26
  Administered 2021-05-21 – 2021-05-25 (×5): via ORAL

## 2021-05-20 MED ORDER — IPRATROPIUM-ALBUTEROL 2.5 MG-0.5 MG/3 ML NEB SOLUTION
2.5 mg-0.5 mg/3 ml | Freq: Four times a day (QID) | RESPIRATORY_TRACT | Status: AC
Start: 2021-05-20 — End: 2021-05-21
  Administered 2021-05-20 – 2021-05-21 (×5): via RESPIRATORY_TRACT

## 2021-05-20 MED ORDER — METHYLPREDNISOLONE (PF) 40 MG/ML IJ SOLR
40 mg/mL | Freq: Two times a day (BID) | INTRAMUSCULAR | Status: DC
Start: 2021-05-20 — End: 2021-05-20

## 2021-05-20 MED ORDER — ONDANSETRON (PF) 4 MG/2 ML INJECTION
4 mg/2 mL | Freq: Four times a day (QID) | INTRAMUSCULAR | Status: AC | PRN
Start: 2021-05-20 — End: 2021-05-26

## 2021-05-20 MED FILL — SOLU-MEDROL (PF) 40 MG/ML SOLUTION FOR INJECTION: 40 mg/mL | INTRAMUSCULAR | Qty: 1

## 2021-05-20 MED FILL — IPRATROPIUM-ALBUTEROL 2.5 MG-0.5 MG/3 ML NEB SOLUTION: 2.5 mg-0.5 mg/3 ml | RESPIRATORY_TRACT | Qty: 3

## 2021-05-20 MED FILL — PIPERACILLIN-TAZOBACTAM 3.375 GRAM IV SOLR: 3.375 gram | INTRAVENOUS | Qty: 3.38

## 2021-05-20 MED FILL — PANTOPRAZOLE 40 MG IV SOLR: 40 mg | INTRAVENOUS | Qty: 40

## 2021-05-20 MED FILL — CLONIDINE 0.2 MG TAB: 0.2 mg | ORAL | Qty: 1

## 2021-05-20 MED FILL — MONTELUKAST 10 MG TAB: 10 mg | ORAL | Qty: 1

## 2021-05-20 MED FILL — HEPARIN (PORCINE) 5,000 UNIT/ML IJ SOLN: 5000 unit/mL | INTRAMUSCULAR | Qty: 1

## 2021-05-20 MED FILL — AMLODIPINE 5 MG TAB: 5 mg | ORAL | Qty: 2

## 2021-05-20 MED FILL — DOCUSATE SODIUM 50 MG/5 ML ORAL LIQUID: 50 mg/5 mL | ORAL | Qty: 10

## 2021-05-20 MED FILL — METOCLOPRAMIDE 5 MG/ML IJ SOLN: 5 mg/mL | INTRAMUSCULAR | Qty: 2

## 2021-05-20 MED FILL — POTASSIUM CHLORIDE SR 20 MEQ TAB, PARTICLES/CRYSTALS: 20 mEq | ORAL | Qty: 1

## 2021-05-20 NOTE — Progress Notes (Signed)
Progress Notes by Salley Slaughter, MD at 05/20/21 1230                Author: Salley Slaughter, MD  Service: Hospitalist  Author Type: Physician       Filed: 05/20/21 1403  Date of Service: 05/20/21 1230  Status: Signed          Editor: Salley Slaughter, MD (Physician)                          INTERNAL MEDICINE PROGRESS NOTE      Date of note:      May 20, 2021      Patient:               [name] [name], 70 y.o., female   Admit Date:        05/11/2021   Length of Stay:  9 day(s)      Problem List:      Patient Active Problem List        Diagnosis  Code         ?  Severe hypertension  I10     ?  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  PTSD (post-traumatic stress disorder)  F43.10     ?  Severe headache  R51.9     ?  Accelerated hypertension  I10     ?  Acute colitis  K52.9     ?  Acute hyperglycemia  R73.9     ?  Uncontrolled type 2 diabetes mellitus with hyperglycemia (HCC)  E11.65     ?  Spondylosis of lumbar region without myelopathy or radiculopathy  M47.816     ?  Lumbar and sacral osteoarthritis  M47.817     ?  Chronic pain syndrome  G89.4     ?  Sacroiliitis (HCC)  M46.1     ?  Type 2 diabetes mellitus with nephropathy (HCC)  E11.21     ?  Lactic acidosis  E87.20     ?  Leukocytosis  D72.829     ?  Sepsis (HCC)  A41.9     ?  Asthma exacerbation  J45.901     ?  Asthma with acute exacerbation  J45.901     ?  Gastritis  K29.70     ?  Nausea & vomiting  R11.2     ?  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  Cannabinoid hyperemesis syndrome  R11.2, F12.90     ?  Type 2 diabetes mellitus with diabetic neuropathy (HCC)  E11.40     ?  Chest pain  R07.9     ?  Dyspnea  R06.00     ?  Dehydration  E86.0     ?  COPD with acute exacerbation (HCC)  J44.1     ?  Acute-on-chronic kidney injury (HCC)  N17.9, N18.9     ?  Hyperkalemia  E87.5     ?  Obtunded  R40.1     ?  Acute renal failure (ARF) (HCC)  N17.9     ?  Septic shock (HCC)  A41.9, R65.21     ?  Metabolic encephalopathy  G93.41      ?  SIRS (systemic inflammatory response syndrome) (HCC)  R65.10     ?  Abdominal pain  R10.9     ?  Headache  R51.9     ?  Chronic respiratory failure with hypoxia (HCC)  J96.11     ?  COPD exacerbation (HCC)  J44.1     ?  Acute hypoxemic respiratory failure (HCC)  J96.01     ?  UTI (urinary tract infection)  N39.0     ?  Acute CHF (congestive heart failure) (HCC)  I50.9         ?  Respiratory failure (HCC)  J96.90             Subjective + interval history        Refused BIPAP last night, now on 3 L. Headache improved. Patient is eating lunch        Assessment :         Acute on chronic hypoxic hypercapnic respiratory failure requiring intubation on 05/11/21   Community-acquired pneumonia with left mucous plugging/atelectasis, s/p bronchoscopy with 05/16/21- mild secretions, irritation left airways, no mucous plug, dynamic collapse Sup segt LLL -> cultures negative, cytology negative for malignant cells   Blood cultures 1 out of 2 positive for Corynebacterium, likely procurement contamination   Hemoptysis secondary to pneumonia   Acute COPD exacerbation - on 3L supplemental oxygen at baseline   Bullous emphysema   Hyperkalemia   Leukocytosis,    Bullous emphysema  Hypertension  Dyslipidemia  Type 2 diabetes mellitus  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 Reactive leukocytosis- improving   Continue nebulized bronchodilator every 6 hours, change IV solumedrol to oral prednisone 40 mg daily for 5 days   Continue Singulair 10 mg daily.  Continue Zosyn for total 7 days-  bronch cultures are negative so far   ECHO showed EF 61%, normal diastolic function        PT OT as tolerated          DVT ppx : Subcutaneous heparin ( left upper and bilateral lower extremity venous PVL was negative for DVT ) - eliquis was held for bronchoscopy and due to mild hemoptysis  GI prophylaxis -Protonix        - Code status: full code            Objective:           Visit Vitals      BP  (!) 153/86 (BP 1 Location: Right upper arm, BP Patient Position: Supine)     Pulse  82     Temp  97.5 ??F (36.4 ??C)     Resp  18     Ht  5\' 7"  (1.702 m)     Wt  94.5 kg (208 lb 5.4 oz)     SpO2  96%        BMI  32.63 kg/m??                    Intake/Output Summary (Last 24 hours) at 05/20/2021 1230   Last data filed at 05/20/2021 1205     Gross per 24 hour        Intake  240 ml  Output  250 ml        Net  -10 ml             Physical Exam:        GEN -alert, oriented to self and place, time, not in distress   HEENT - mucous membranes moist   Neck - supple, no JVD   Cardiac - RRR, S1, S2, no murmurs   Chest/Lungs - mild expiratory wheezing,    Abdomen - soft, obese, non tender   Extremities - no clubbing/ cyanosis/ edema   Neuro -follows commands, moves all 4 extremities   Skin - no rashes or lesions        Current medications:           Current Facility-Administered Medications:    ?  methylPREDNISolone (PF) (SOLU-MEDROL) injection 40 mg, 40 mg, IntraVENous, Q12H, Nusayba Cadenas S, MD   ?  ondansetron (ZOFRAN) injection 4 mg, 4 mg, IntraVENous, Q6H PRN, Rishav Rockefeller S, MD   ?  potassium chloride (K-DUR, KLOR-CON M20) SR tablet 20 mEq, 20 mEq, Oral, BID, Maricela Kawahara S, MD   ?  glucagon (GLUCAGEN) injection 1 mg, 1 mg, IntraMUSCular, PRN, Ramiel Forti S, MD   ?  insulin glargine (LANTUS) injection 1-100 Units, 1-100 Units, SubCUTAneous, QHS, Pierre Cumpton S, MD, 14 Units at 05/19/21 2144   ?  insulin lispro (HUMALOG) injection 1-100 Units, 1-100 Units, SubCUTAneous, AC&HS, Kale Dols S, MD, 1 Units at 05/20/21 0824   ?  insulin lispro (HUMALOG) injection 1-100 Units, 1-100 Units,  SubCUTAneous, PRN, Drayce Tawil S, MD   ?  acetaminophen (TYLENOL) tablet 650 mg, 650 mg, Oral, Q6H PRN, Cellie Dardis S, MD, 650 mg at 05/19/21 1352   ?  fluticasone-vilanterol (BREO ELLIPTA) 262mcg-25mcg/puff, 1 Puff, Inhalation, DAILY, Zoe Lan, MD, 1 Puff at 05/20/21 0900   ?  tiotropium bromide (SPIRIVA RESPIMAT) 2.5 mcg Salvadore Farber, 2 Melvin, Inhalation, DAILY, Zoe Lan, MD, 2 Puff at 05/20/21 0900   ?  cloNIDine HCL (CATAPRES) tablet 0.2 mg, 0.2 mg, Oral, BID, Zoe Lan, MD, 0.2 mg at 05/20/21 1610   ?  amLODIPine (NORVASC) tablet 10 mg, 10 mg, Oral, DAILY, Reid, Hailey, PA-C, 10 mg at 05/20/21 9604   ?  albuterol-ipratropium (DUO-NEB) 2.5 MG-0.5 MG/3 ML, 3 mL, Nebulization, Q4H RT, Faythe Ghee C, MD, 3 mL at 05/20/21 1117   ?  heparin (porcine) injection 5,000 Units, 5,000 Units, SubCUTAneous, Q8H, Imad, Melhem, MD, 5,000 Units at 05/20/21 5409   ?  hydrALAZINE (APRESOLINE) 20 mg/mL injection 20 mg, 20 mg, IntraVENous, Q6H PRN, Lottie Dawson, NP, 20 mg at 05/19/21 0443   ?  pantoprazole (PROTONIX) 40 mg in 0.9% sodium chloride 10 mL injection, 40 mg, IntraVENous, DAILY, Lottie Dawson, NP, 40 mg at 05/20/21  8119   ?  dextrose (D50W) injection syrg 5-25 g, 10-50 mL, IntraVENous, PRN, Lottie Dawson, NP   ?  docusate (COLACE) 50 mg/5 mL oral liquid 100 mg, 100 mg, Oral, BID, Imad, Melhem, MD, 100 mg at 05/20/21 0903   ?  piperacillin-tazobactam (ZOSYN) 3.375 g in 0.9% sodium chloride (MBP/ADV) 100 mL MBP, 3.375 g, IntraVENous, Q8H, Erling Conte, MD, Last  Rate: 25 mL/hr at 05/20/21 0903, 3.375 g at 05/20/21 0903   ?  montelukast (SINGULAIR) tablet 10 mg, 10 mg, Oral, QHS, Lauralee Evener, PA-C, 10 mg at 05/19/21 2131   ?  white petrolatum-mineral oiL (LACRILUBE S.O.P.) ointment 1 Each, 1 Each, Both  Eyes, DAILY, Erling Conte, MD, 1 Each at 05/20/21 0900        Labs:          Recent Results (from the past 24 hour(s))     GLUCOSE, POC          Collection Time: 05/19/21   5:30 PM         Result  Value  Ref Range            Glucose (POC)  205 (H)  65 - 105 mg/dL       GLUCOSE, POC          Collection Time: 05/19/21  9:41 PM         Result  Value  Ref Range            Glucose (POC)  288 (H)  65 - 105 mg/dL       METABOLIC PANEL, BASIC          Collection Time: 05/20/21  3:47 AM         Result  Value  Ref Range            Potassium  3.3 (L)  3.5 - 5.1 mEq/L       Chloride  100  98 - 107 mEq/L       Sodium  141  136 - 145 mEq/L       CO2  34 (H)  20 - 31 mEq/L       Glucose  212 (H)  74 - 106 mg/dl       BUN  16  9 - 23 mg/dl       Creatinine  0.93 (L)  0.55 - 1.02 mg/dl       GFR est AA  >81.8          GFR est non-AA  >60          Calcium  9.0  8.7 - 10.4 mg/dl       Anion gap  7  5 - 15 mmol/L       CBC WITH AUTOMATED DIFF          Collection Time: 05/20/21  3:47 AM         Result  Value  Ref Range            WBC  17.6 (H)  4.0 - 11.0 1000/mm3       RBC  3.77  3.60 - 5.20 M/uL       HGB  10.2 (L)  13.0 - 17.2 gm/dl       HCT  29.9 (L)  37.1 - 50.0 %       MCV  87.5  80.0 - 98.0 fL       MCH  27.1  25.4 - 34.6 pg       MCHC  30.9  30.0 - 36.0 gm/dl       PLATELET  696  789 - 450 1000/mm3       MPV  12.4 (H)  6.0 - 10.0 fL       RDW-SD  50.4 (H)  36.4 - 46.3         NRBC  0  0 - 0         IMMATURE GRANULOCYTES  0.8  0.0 - 3.0 %       NEUTROPHILS  88.8 (H)  34 - 64 %       LYMPHOCYTES  3.2 (L)  28 -  48 %       MONOCYTES  7.1  1 - 13 %       EOSINOPHILS  0.0  0 - 5 %            BASOPHILS  0.1  0 - 3 %           XR Results:   Results from Hospital Encounter encounter on 05/11/21      XR CHEST SNGL V      Narrative   Chest      Indication: SOB   Comparison: 05/17/2021      Findings:      AP portable upright view of the chest demonstrates that the cardiac silhouette   is not  enlarged. Stable left upper lobe bulla. Interval worsening of right   lower lobe airspace disease. Stable left mid and basilar airspace disease.      Impression   Impression:      Worsening right lower lobe airspace  disease. Stable left lung airspace disease      Electronically signed by: Jodi Mourning, MD 05/18/2021 9:42 AM EST         CT Results:   Results from Hospital Encounter encounter on 05/11/21      CT HEAD WO CONT      Narrative   Examination: CT head without contrast.      INDICATION: AMS and headache      COMPARISON: 2020      TECHNIQUE: CT head without contrast. DICOM format image data is available to   non-affiliated external healthcare facilities or entities on a secure, media   free, reciprocally searchable basis with patient authorization for 12 months   following the date of the study.      All CT exams at this facility use one or more dose reduction techniques   including automatic exposure control, mA/kV adjustment per patient's size, or   iterative reconstruction technique.      WORKSTATION ID: [id number]      FINDINGS:      No focal mass, midline shift or acute intracranial hemorrhage.      Prominent diffuse parenchymal volume loss and advanced chronic small vessel   ischemic change.      A stable small left posterior cranial fossa, petrous ridge meningioma measuring   1.1 cm in maximal dimension.      Paranasal sinuses, mastoid air cells and middle ear cavities grossly   unremarkable. Mild dehiscence of the right lamina papyracea, which may reflect   radiation normal anatomy versus sequela of previous injury.      Impression   IMPRESSION:   1. No acute intracranial abnormalities.   2. Diffuse parenchymal volume loss and advanced chronic small vessel ischemic   change.   3. Stable small left petrous ridge meningioma.      Electronically signed by: Mikal Plane, MD 05/19/2021 6:33 PM EST         ]               Total clinical care time was 45  minutes of which more than 50% was spent in coordination of care and counseling (time spent with patient/family face to face, physical exam, reviewing laboratory and imaging investigations, speaking with physicians and  nursing staff involved in this patient's care).        Dragon medical dictation system was used for part of this note. Unintentional voice recognition errors may occur      Tia Masker,  MD  Hospitalist   [facility]   May 20, 2021   Time: 12:30 PM

## 2021-05-20 NOTE — Progress Notes (Signed)
[name] [name] patient has again refused to wear the bipap tonight.  She is on 3LNC with oxygen at 96%.      BiPAP Progress Note    Patient currently is Off BiPAP  Patient refused BiPAP for the pm shift    BiPAP QHS    BiPAP settings are:  Bio-Med ID #: [id number]  IPAP:  18 cm H2O   EPAP: 10 cm H2O  Rate:   10  FiO2:   30 %     BiPAP will be removed from room after 48 hours of non-use

## 2021-05-20 NOTE — Progress Notes (Signed)
Progress  Notes by Shea Stakes, MD at 05/20/21 1316                Author: Shea Stakes, MD  Service: Pulmonary Disease  Author Type: Physician       Filed: 05/20/21 1324  Date of Service: 05/20/21 1316  Status: Signed          Editor: Shea Stakes, MD (Physician)                              [facility] PULMONARY AND CRITICAL CARE MEDICINE           Progress Note          Name:  [name] [name]        DOB:  Feb 28, 1951     MRN:  [id number]        Date:  05/20/2021      [x]  I have reviewed the flowsheet and previous days notes. Events, vitals, medications and notes from last 24 hours reviewed.         ASSESSMENT:        1.  Acute hypoxic hypercapnic respiratory failure in the setting of COPD and pneumonia. Uses 3L HOT   2.  CAP with L mucus plugging / atelectasis.  H/o same in 2020 on CT.   s/p full bronchoscopy 05/16/21 -> mild secretions / irritation L airways/ no mucus plug - dynamic  collapse Sup segt LLL.  Leukocytosis resolved.  Sputum culture 05/12/2021 negative.  Bronch culture pathology negative.    3.  1 out of 2 blood cultures positive for Corynebacterium species on 05/11/2021.  Likely contamination.  MRSA swab negative.   4.  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 05/13/2021, now Dr. Helene Shoe.  on Advair, Spiriva and Singulair at  home.   5.  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    6.  Hx Lung nodule - 05/11/21 CTA Chest with LLL nodule (2 cm) - Chronic stable s/p CT guided bx in 2017   7.  Hx HTN, HLD, DM2, HepC, PTSD, Chronic pain   8.  Hx Thoracic ascending aortic aneurysm   9.  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  Resolved on PVL 05/12/2021.  Eliquis held due to mild hemoptysis.           PLAN:         -Target O2 sat 88% and above. Avoid hyperoxia   -Change Methylpred to 40 mg prednisone daily for the next 4 days and then stop   -Continue antibiotics to finish a total of 7 days therapy   - Space out DuoNebs to every 6 hours  -   Continue Breo and Spiriva   -Has chronic hypercapnia and may need changing of her home CPAP device to BiPAP.  This will be challenging as she is not compliant with  her CPAP device.  Follow-up as outpatient.   -Replace electrolytes aggressively.      Explained assessment and plan to the patient and family and answered questions.                       SUBJECTIVE:   She is doing well. Denies any coughing, dyspnea, cough, fevers, chills, wheezing or chest pain.          ROS:    Otherwise negative except for as mentioned in  subjective.       Allergy:     Allergies        Allergen  Reactions         ?  Chocolate [Cocoa]  Sneezing             Confirms allergy but reports "I still eat it"            Vital Signs:       Visit Vitals   BP  (!) 153/86 (BP 1 Location: Right upper arm, BP Patient Position: Supine)      Pulse  82      Temp  97.5 ??F (36.4 ??C)      Resp  18      Ht  5\' 7"  (1.702 m)      Wt  94.5 kg (208 lb 5.4 oz)      SpO2  96%      BMI  32.63 kg/m??                O2 Device: Nasal cannula     O2 Flow Rate (L/min): 3 l/min       Temp (24hrs), Avg:98.1 ??F (36.7 ??C), Min:96.6 ??F (35.9 ??C), Max:99 ??F (37.2 ??C)        Patient Vitals for the past 8 hrs:            Temp  Pulse  Resp  BP  SpO2            05/20/21 1151  97.5 ??F (36.4 ??C)  82  18  (!) 153/86  96 %            05/20/21 0826  97.3 ??F (36.3 ??C)  79  17  (!) 162/96  96 %     05/20/21 0800  --  --  --  --  96 %            05/20/21 0748  --  --  --  --  94 %              Intake/Output:       Last shift:      12/08 0701 - 12/08 1900   In: 240 [P.O.:240]   Out: -    Last 3 shifts: 12/06 1901 - 12/08 0700   In: 80 [P.O.:80]   Out: 1850 [Urine:1850]      Intake/Output Summary (Last 24 hours) at 05/20/2021 1316   Last data filed at 05/20/2021 1205     Gross per 24 hour        Intake  240 ml        Output  250 ml        Net  -10 ml               Physical Exam:   GENERAL: AAO x4 .    NECK: Supple. Trachea midline.    RESPIRATORY: Decreased breath sounds in the left side,  slight expiratory wheezing.    CARDIOVASCULAR: S1 and S2 present, regular. No murmur, rub, or thrill.    ABDOMEN: Soft and nontender.    NEUROLOGICAL: WNL.    EXT: No edema or cyanosis.          DATA:         Current Facility-Administered Medications          Medication  Dose  Route  Frequency           ?  methylPREDNISolone (PF) (SOLU-MEDROL) injection 40 mg  40 mg  IntraVENous  Q12H     ?  ondansetron (ZOFRAN) injection 4 mg   4 mg  IntraVENous  Q6H PRN     ?  potassium chloride (K-DUR, KLOR-CON M20) SR tablet 20 mEq   20 mEq  Oral  BID     ?  glucagon (GLUCAGEN) injection 1 mg   1 mg  IntraMUSCular  PRN     ?  insulin glargine (LANTUS) injection 1-100 Units   1-100 Units  SubCUTAneous  QHS     ?  insulin lispro (HUMALOG) injection 1-100 Units   1-100 Units  SubCUTAneous  AC&HS     ?  insulin lispro (HUMALOG) injection 1-100 Units   1-100 Units  SubCUTAneous  PRN     ?  acetaminophen (TYLENOL) tablet 650 mg   650 mg  Oral  Q6H PRN     ?  fluticasone-vilanterol (BREO ELLIPTA) 249mcg-25mcg/puff   1 Puff  Inhalation  DAILY     ?  tiotropium bromide (SPIRIVA RESPIMAT) 2.5 mcg /actuation   2 Puff  Inhalation  DAILY     ?  cloNIDine HCL (CATAPRES) tablet 0.2 mg   0.2 mg  Oral  BID     ?  amLODIPine (NORVASC) tablet 10 mg   10 mg  Oral  DAILY     ?  albuterol-ipratropium (DUO-NEB) 2.5 MG-0.5 MG/3 ML   3 mL  Nebulization  Q4H RT     ?  heparin (porcine) injection 5,000 Units   5,000 Units  SubCUTAneous  Q8H     ?  hydrALAZINE (APRESOLINE) 20 mg/mL injection 20 mg   20 mg  IntraVENous  Q6H PRN     ?  pantoprazole (PROTONIX) 40 mg in 0.9% sodium chloride 10 mL injection   40 mg  IntraVENous  DAILY     ?  dextrose (D50W) injection syrg 5-25 g   10-50 mL  IntraVENous  PRN     ?  docusate (COLACE) 50 mg/5 mL oral liquid 100 mg   100 mg  Oral  BID     ?  piperacillin-tazobactam (ZOSYN) 3.375 g in 0.9% sodium chloride (MBP/ADV) 100 mL MBP   3.375 g  IntraVENous  Q8H     ?  montelukast (SINGULAIR) tablet 10 mg   10 mg  Oral  QHS            ?  white petrolatum-mineral oiL (LACRILUBE S.O.P.) ointment 1 Each   1 Each  Both Eyes  DAILY                    Labs:   Reviewed.      Recent Results (from the past 24 hour(s))     GLUCOSE, POC          Collection Time: 05/19/21  5:30 PM         Result  Value  Ref Range            Glucose (POC)  205 (H)  65 - 105 mg/dL       GLUCOSE, POC          Collection Time: 05/19/21  9:41 PM         Result  Value  Ref Range            Glucose (POC)  288 (H)  65 - 105 mg/dL       METABOLIC PANEL, BASIC          Collection Time:  05/20/21  3:47 AM         Result  Value  Ref Range            Potassium  3.3 (L)  3.5 - 5.1 mEq/L            Chloride  100  98 - 107 mEq/L            Sodium  141  136 - 145 mEq/L       CO2  34 (H)  20 - 31 mEq/L       Glucose  212 (H)  74 - 106 mg/dl       BUN  16  9 - 23 mg/dl       Creatinine  5.57 (L)  0.55 - 1.02 mg/dl       GFR est AA  >32.2          GFR est non-AA  >60          Calcium  9.0  8.7 - 10.4 mg/dl       Anion gap  7  5 - 15 mmol/L       CBC WITH AUTOMATED DIFF          Collection Time: 05/20/21  3:47 AM         Result  Value  Ref Range            WBC  17.6 (H)  4.0 - 11.0 1000/mm3       RBC  3.77  3.60 - 5.20 M/uL       HGB  10.2 (L)  13.0 - 17.2 gm/dl       HCT  02.5 (L)  42.7 - 50.0 %       MCV  87.5  80.0 - 98.0 fL       MCH  27.1  25.4 - 34.6 pg       MCHC  30.9  30.0 - 36.0 gm/dl       PLATELET  062  376 - 450 1000/mm3       MPV  12.4 (H)  6.0 - 10.0 fL       RDW-SD  50.4 (H)  36.4 - 46.3         NRBC  0  0 - 0         IMMATURE GRANULOCYTES  0.8  0.0 - 3.0 %       NEUTROPHILS  88.8 (H)  34 - 64 %       LYMPHOCYTES  3.2 (L)  28 - 48 %       MONOCYTES  7.1  1 - 13 %       EOSINOPHILS  0.0  0 - 5 %       BASOPHILS  0.1  0 - 3 %       GLUCOSE, POC          Collection Time: 05/20/21  1:04 PM         Result  Value  Ref Range            Glucose (POC)  260 (H)  65 - 105 mg/dL             Imaging:   I have personally reviewed imaging.             Shea Stakes, MD, FACP   Pulmonary  & Critical Care   [facility]  Dragon medical dictation software was used for portions of this report. Unintended voice transcription errors may have occurred.

## 2021-05-20 NOTE — Progress Notes (Signed)
Problem: Pressure Injury - Risk of  Goal: *Prevention of pressure injury  Description: Document Braden Scale and appropriate interventions in the flowsheet.  Outcome: Progressing Towards Goal  Note: Pressure Injury Interventions:  Sensory Interventions: Assess changes in LOC, Check visual cues for pain, Keep linens dry and wrinkle-free, Minimize linen layers, Maintain/enhance activity level, Pressure redistribution bed/mattress (bed type)    Moisture Interventions: Absorbent underpads, Check for incontinence Q2 hours and as needed, Internal/External urinary devices, Limit adult briefs, Minimize layers    Activity Interventions: Pressure redistribution bed/mattress(bed type)    Mobility Interventions: HOB 30 degrees or less, Pressure redistribution bed/mattress (bed type)    Nutrition Interventions: Document food/fluid/supplement intake    Friction and Shear Interventions: HOB 30 degrees or less, Minimize layers                Problem: Falls - Risk of  Goal: *Absence of Falls  Description: Document Schmid Fall Risk and appropriate interventions in the flowsheet.  Outcome: Progressing Towards Goal  Note: Fall Risk Interventions:  Mobility Interventions: Bed/chair exit alarm    Mentation Interventions: Bed/chair exit alarm    Medication Interventions: Bed/chair exit alarm    Elimination Interventions: Bed/chair exit alarm, Call light in reach, Toileting schedule/hourly rounds              Problem: Patient Education: Go to Patient Education Activity  Goal: Patient/Family Education  Outcome: Progressing Towards Goal     Problem: Gas Exchange - Impaired  Goal: *Absence of hypoxia  Outcome: Progressing Towards Goal

## 2021-05-20 NOTE — Progress Notes (Signed)
 SPEECH LANGUAGE PATHOLOGY   BEDSIDE DYSPHAGIA TREATMENT      Patient: [name] [name] (70 y.o. female) 1950/12/29  Room: 5221/5221  Primary Diagnosis: Respiratory failure (HCC) [J96.90]    Procedure(s) (LRB):  BRONCHOSCOPY Bedside ICU-9 (N/A) 6 Days Post-Op    Isolation:  There are currently no Active Isolations       MDRO: No current active infections  PPE: surgical mask, gloves  Precautions:  universal and aspiration     Date: 05/20/2021  Start Time:  7:59 End Time:  8:18    TREATMENT:    Orientation: Pt awake, alert, able to follow contextual commands   Respiratory Status: O2 via NC, 3L    Treatment: Swallow tx completed b/s with tolerance/ safety of IDDSI 6- soft & bite sized, IDDSI 0- thin, therapeutic trials of IDDSI 7- easy to chew for possible diet advancement, and use of compensatory swallow strategies.  Dx trials of soft solids and regular-easy to chew solids with mildly prolonged mastication and adequate oral clearance.  Small, single sips thin liquids with fairly timely swallow response and no overt s/s aspiration or change in breath sounds.  Delayed cough and increased WOB following serial straw drinking.  Recommend advance solids to IDDSI 7-easy to chew, continue thin liquids via SMALL, SINGLE sips.        Functional Oral Intake Scale: Level 6: Total oral diet of multiple consistencies without special preparation but with specific food limitations    PLAN/ RECOMMENDATIONS:                 Diet Recommendations: change solids to easy to chew (IDDSI 7), continue thin liquids   Compensatory Swallow Strategies: HOB ~60* for all po feeds and >45* at least 30 minutes following, SMALL, SINGLE sips, small bites/ sip, slow rate of eating/ feeding, alternate solids/ liquids   Medication Administration: as tolerated   Aspiration Precautions: may feed self independently, provide verbal cues for compensatory swallow strategies  Risk(s) for Aspiration: medical condition, COPD, esophageal dysfunction (ie: GERD, stricture,  esophagitis), debility, current level of dysphagia, s/p intubation     Therapy Recommendations: pt would benefit from continuation of SLP for dysphagia as per POC    Therapy Prognosis: good    Discharge Recommendations: to be determined  Other Recommended Services: OT, PT, Dietitian     SUBJECTIVE:    Patient Stated: I don' think I need it.   Pain Prior to Treatment: no pain reported   Pain Following Treatment: no pain reported     OBJECTIVE:    Position: upright in bed  Consistencies: thin single sips, serial sips, via cup, and via straw, easy to chew (IDDSI 7) and soft/ bite sized (IDDSI 6)   Oral Phase: mild prolonged mastication  Pharyngeal Phase: mild delayed cough following thin via serial straw drinking  Exercises:  n/a    Safety: Following session, pt tray table, phone, and call bell within reach   Modified Rankin Score (MRS): Defer to OT/ PT    EDUCATION:   Education Provided: POC, diet recommendations, and compensatory strategies for swallowing  Individual Educated: patient  Comprehension: pt communicated comprehension and query pt's level carry over  Staff Education: Educated Nurse to POC and diet recommendations.       Thank you,      Isaiah Lima MS CCC-SLP  Speech-Language Pathologist  [phone] or Secure Chat

## 2021-05-21 LAB — CBC WITH AUTO DIFFERENTIAL
Basophils %: 0.1 % (ref 0–3)
Eosinophils %: 0.5 % (ref 0–5)
Hematocrit: 36.8 % — ABNORMAL LOW (ref 37.0–50.0)
Hemoglobin: 11.3 gm/dl — ABNORMAL LOW (ref 13.0–17.2)
Immature Granulocytes: 0.7 % (ref 0.0–3.0)
Lymphocytes %: 11.7 % — ABNORMAL LOW (ref 28–48)
MCH: 27.4 pg (ref 25.4–34.6)
MCHC: 30.7 gm/dl (ref 30.0–36.0)
MCV: 89.1 fL (ref 80.0–98.0)
MPV: 12.8 fL — ABNORMAL HIGH (ref 6.0–10.0)
Monocytes %: 9.2 % (ref 1–13)
Neutrophils %: 77.8 % — ABNORMAL HIGH (ref 34–64)
Nucleated RBCs: 0 (ref 0–0)
Platelets: 193 10*3/uL (ref 140–450)
RBC: 4.13 M/uL (ref 3.60–5.20)
RDW-SD: 53 — ABNORMAL HIGH (ref 36.4–46.3)
WBC: 15.5 10*3/uL — ABNORMAL HIGH (ref 4.0–11.0)

## 2021-05-21 LAB — POCT GLUCOSE
POC Glucose: 216 mg/dL — ABNORMAL HIGH (ref 65–105)
POC Glucose: 161 mg/dL — ABNORMAL HIGH (ref 65–105)
POC Glucose: 153 mg/dL — ABNORMAL HIGH (ref 65–105)
POC Glucose: 207 mg/dL — ABNORMAL HIGH (ref 65–105)

## 2021-05-21 LAB — CBC WITH AUTOMATED DIFF
BASOPHILS: 0.1 % (ref 0–3)
EOSINOPHILS: 0.5 % (ref 0–5)
HCT: 36.8 % — ABNORMAL LOW (ref 37.0–50.0)
HGB: 11.3 gm/dl — ABNORMAL LOW (ref 13.0–17.2)
IMMATURE GRANULOCYTES: 0.7 % (ref 0.0–3.0)
LYMPHOCYTES: 11.7 % — ABNORMAL LOW (ref 28–48)
MCH: 27.4 pg (ref 25.4–34.6)
MCHC: 30.7 gm/dl (ref 30.0–36.0)
MCV: 89.1 fL (ref 80.0–98.0)
MONOCYTES: 9.2 % (ref 1–13)
MPV: 12.8 fL — ABNORMAL HIGH (ref 6.0–10.0)
NEUTROPHILS: 77.8 % — ABNORMAL HIGH (ref 34–64)
NRBC: 0 (ref 0–0)
PLATELET: 193 10*3/uL (ref 140–450)
RBC: 4.13 M/uL (ref 3.60–5.20)
RDW-SD: 53 — ABNORMAL HIGH (ref 36.4–46.3)
WBC: 15.5 10*3/uL — ABNORMAL HIGH (ref 4.0–11.0)

## 2021-05-21 LAB — GLUCOSE, POC
Glucose (POC): 216 mg/dL — ABNORMAL HIGH (ref 65–105)
Glucose (POC): 161 mg/dL — ABNORMAL HIGH (ref 65–105)
Glucose (POC): 153 mg/dL — ABNORMAL HIGH (ref 65–105)
Glucose (POC): 207 mg/dL — ABNORMAL HIGH (ref 65–105)

## 2021-05-21 MED ORDER — IPRATROPIUM-ALBUTEROL 2.5 MG-0.5 MG/3 ML NEB SOLUTION
2.5 mg-0.5 mg/3 ml | Freq: Four times a day (QID) | RESPIRATORY_TRACT | Status: AC | PRN
Start: 2021-05-21 — End: 2021-05-26
  Administered 2021-05-23 – 2021-05-25 (×3): via RESPIRATORY_TRACT

## 2021-05-21 MED FILL — MONTELUKAST 10 MG TAB: 10 mg | ORAL | Qty: 1

## 2021-05-21 MED FILL — IPRATROPIUM-ALBUTEROL 2.5 MG-0.5 MG/3 ML NEB SOLUTION: 2.5 mg-0.5 mg/3 ml | RESPIRATORY_TRACT | Qty: 3

## 2021-05-21 MED FILL — POTASSIUM CHLORIDE SR 20 MEQ TAB, PARTICLES/CRYSTALS: 20 mEq | ORAL | Qty: 1

## 2021-05-21 MED FILL — AMLODIPINE 5 MG TAB: 5 mg | ORAL | Qty: 2

## 2021-05-21 MED FILL — CLONIDINE 0.2 MG TAB: 0.2 mg | ORAL | Qty: 1

## 2021-05-21 MED FILL — PIPERACILLIN-TAZOBACTAM 3.375 GRAM IV SOLR: 3.375 gram | INTRAVENOUS | Qty: 3.38

## 2021-05-21 MED FILL — PANTOPRAZOLE 40 MG IV SOLR: 40 mg | INTRAVENOUS | Qty: 40

## 2021-05-21 MED FILL — HEPARIN (PORCINE) 5,000 UNIT/ML IJ SOLN: 5000 unit/mL | INTRAMUSCULAR | Qty: 1

## 2021-05-21 MED FILL — TYLENOL 325 MG TABLET: 325 mg | ORAL | Qty: 2

## 2021-05-21 MED FILL — PREDNISONE 20 MG TAB: 20 mg | ORAL | Qty: 2

## 2021-05-21 MED FILL — DOCUSATE SODIUM 50 MG/5 ML ORAL LIQUID: 50 mg/5 mL | ORAL | Qty: 10

## 2021-05-21 NOTE — Progress Notes (Signed)
BiPAP Progress Note    Patient currently is Off  BiPAP.  Patient tolerated BiPAP for the PM shift.    BiPAP QHS. Pt wore BiPaP for 2 hrs last night.     BiPAP settings are:  Bio-Med ID #: [id number]  IPAP:  18 cm H2O   EPAP: 10 cm H2O  Rate:   10  FiO2:   30 %     BiPAP will be removed from room after 48 hours of non-use

## 2021-05-21 NOTE — Progress Notes (Signed)
Problem: Non-Violent Restraints  Goal: Removal from restraints as soon as assessed to be safe  Outcome: Progressing Towards Goal  Goal: No harm/injury to patient while restraints in use  Outcome: Progressing Towards Goal  Goal: Patient's dignity will be maintained  Outcome: Progressing Towards Goal  Goal: Patient Interventions  Outcome: Progressing Towards Goal     Problem: Pressure Injury - Risk of  Goal: *Prevention of pressure injury  Description: Document Braden Scale and appropriate interventions in the flowsheet.  Outcome: Progressing Towards Goal  Note: Pressure Injury Interventions:  Sensory Interventions: Float heels, Keep linens dry and wrinkle-free    Moisture Interventions: Check for incontinence Q2 hours and as needed, Absorbent underpads    Activity Interventions: Pressure redistribution bed/mattress(bed type)    Mobility Interventions: HOB 30 degrees or less, Float heels    Nutrition Interventions: Document food/fluid/supplement intake    Friction and Shear Interventions: HOB 30 degrees or less, Lift sheet                Problem: Patient Education: Go to Patient Education Activity  Goal: Patient/Family Education  Outcome: Progressing Towards Goal     Problem: Falls - Risk of  Goal: *Absence of Falls  Description: Document Schmid Fall Risk and appropriate interventions in the flowsheet.  Outcome: Progressing Towards Goal  Note: Fall Risk Interventions:  Mobility Interventions: Bed/chair exit alarm, Patient to call before getting OOB    Mentation Interventions: Bed/chair exit alarm, Adequate sleep, hydration, pain control, Door open when patient unattended    Medication Interventions: Bed/chair exit alarm, Patient to call before getting OOB, Teach patient to arise slowly    Elimination Interventions: Bed/chair exit alarm, Call light in reach, Elevated toilet seat, Stay With Me (per policy), Toilet paper/wipes in reach, Toileting schedule/hourly rounds              Problem: Patient Education: Go to  Patient Education Activity  Goal: Patient/Family Education  Outcome: Progressing Towards Goal     Problem: Gas Exchange - Impaired  Goal: *Absence of hypoxia  Outcome: Progressing Towards Goal     Problem: Patient Education: Go to Patient Education Activity  Goal: Patient/Family Education  Outcome: Progressing Towards Goal

## 2021-05-21 NOTE — Progress Notes (Signed)
Progress  Notes by Shea Stakes, MD at 05/21/21 1747                Author: Shea Stakes, MD  Service: Pulmonary Disease  Author Type: Physician       Filed: 05/21/21 1749  Date of Service: 05/21/21 1747  Status: Signed          Editor: Shea Stakes, MD (Physician)                              [facility] PULMONARY AND CRITICAL CARE MEDICINE           Progress Note          Name:  [name] [name]        DOB:  July 18, 1950     MRN:  [id number]        Date:  05/21/2021       I have reviewed the flowsheet and previous days notes. Events, vitals, medications and notes from last 24 hours reviewed.         ASSESSMENT:        1.  Acute hypoxic hypercapnic respiratory failure in the setting of COPD and pneumonia. Uses 3L HOT   2.  CAP with L mucus plugging / atelectasis.  H/o same in 2020 on CT.   s/p full bronchoscopy 05/16/21 -> mild secretions / irritation L airways/ no mucus plug - dynamic  collapse Sup segt LLL.  Leukocytosis resolved.  Sputum culture 05/12/2021 negative.  Bronch culture pathology negative.    3.  1 out of 2 blood cultures positive for Corynebacterium species on 05/11/2021.  Likely contamination.  MRSA swab negative.   4.  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 Helene Shoe, now Dr. Allena Katz.  on Advair, Spiriva and Singulair at  home.   5.  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    6.  Hx Lung nodule - 05/11/21 CTA Chest with LLL nodule (2 cm) - Chronic stable s/p CT guided bx in 2017   7.  Hx HTN, HLD, DM2, HepC, PTSD, Chronic pain   8.  Hx Thoracic ascending aortic aneurysm   9.  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  Resolved on PVL 05/12/2021.  Eliquis held due to mild hemoptysis.      She is doing quite well.        PLAN:         -Target O2 sat 88% and above. Avoid hyperoxia   -Change Methylpred to 40 mg prednisone daily for the next 4 days and then stop   -Continue antibiotics to finish a total of 7 days therapy   -\\Change DuoNebs  to as needed  -  Continue Breo and Spiriva   -Has chronic hypercapnia and may need changing of her home CPAP device to BiPAP.  This will be challenging as she is not compliant with  her CPAP device.  Follow-up as outpatient.   -Replace electrolytes aggressively.      She is doing well.  Should continue her home inhalers and follow-up with her pulmonologist.  [facility] will sign off.  Please call for any questions or concerns      Explained assessment and plan to the patient and family and answered questions.                       SUBJECTIVE:  She is doing well. Denies any coughing, dyspnea, cough, fevers, chills, wheezing or chest pain.          ROS:    Otherwise negative except for as mentioned in subjective.       Allergy:     Allergies        Allergen  Reactions         ?  Chocolate [Cocoa]  Sneezing             Confirms allergy but reports "I still eat it"            Vital Signs:       Visit Vitals   BP  (!) 147/83 (BP 1 Location: Right upper arm, BP Patient Position: Lying left side)      Pulse  79      Temp  97.3 ??F (36.3 ??C)      Resp  18      Ht  5\' 7"  (1.702 m)      Wt  94.5 kg (208 lb 5.4 oz)      SpO2  96%      BMI  32.63 kg/m??                O2 Device: Nasal cannula, BIPAP     O2 Flow Rate (L/min): 2 l/min       Temp (24hrs), Avg:97.2 ??F (36.2 ??C), Min:96.8 ??F (36 ??C), Max:97.5 ??F (36.4 ??C)        Patient Vitals for the past 8 hrs:            Temp  Pulse  Resp  BP  SpO2            05/21/21 1507  97.3 ??F (36.3 ??C)  79  --  (!) 147/83  96 %            05/21/21 1448  --  --  --  --  97 %            05/21/21 1109  97.3 ??F (36.3 ??C)  80  18  (!) 142/90  97 %                 Intake/Output:       Last shift:      12/09 0701 - 12/09 1900   In: -    Out: 800 [Urine:800]   Last 3 shifts: 12/07 1901 - 12/09 0700   In: 480 [P.O.:480]   Out: 300 [Urine:300]      Intake/Output Summary (Last 24 hours) at 05/21/2021 1747   Last data filed at 05/21/2021 0750     Gross per 24 hour        Intake  --        Output  800 ml         Net  -800 ml                  Physical Exam:   GENERAL: AAO x4 .    NECK: Supple. Trachea midline.    RESPIRATORY Overall improved breathing.    CARDIOVASCULAR: S1 and S2 present, regular. No murmur, rub, or thrill.    ABDOMEN: Soft and nontender.    NEUROLOGICAL: WNL.    EXT: No edema or cyanosis.          DATA:         Current Facility-Administered Medications          Medication  Dose  Route  Frequency           ?  ondansetron (ZOFRAN) injection 4 mg   4 mg  IntraVENous  Q6H PRN     ?  predniSONE (DELTASONE) tablet 40 mg   40 mg  Oral  DAILY WITH BREAKFAST     ?  albuterol-ipratropium (DUO-NEB) 2.5 MG-0.5 MG/3 ML   3 mL  Nebulization  Q6H RT     ?  glucagon (GLUCAGEN) injection 1 mg   1 mg  IntraMUSCular  PRN     ?  insulin glargine (LANTUS) injection 1-100 Units   1-100 Units  SubCUTAneous  QHS     ?  insulin lispro (HUMALOG) injection 1-100 Units   1-100 Units  SubCUTAneous  AC&HS     ?  insulin lispro (HUMALOG) injection 1-100 Units   1-100 Units  SubCUTAneous  PRN     ?  acetaminophen (TYLENOL) tablet 650 mg   650 mg  Oral  Q6H PRN     ?  fluticasone-vilanterol (BREO ELLIPTA) 238mcg-25mcg/puff   1 Puff  Inhalation  DAILY     ?  tiotropium bromide (SPIRIVA RESPIMAT) 2.5 mcg /actuation   2 Puff  Inhalation  DAILY           ?  cloNIDine HCL (CATAPRES) tablet 0.2 mg   0.2 mg  Oral  BID           ?  amLODIPine (NORVASC) tablet 10 mg   10 mg  Oral  DAILY     ?  heparin (porcine) injection 5,000 Units   5,000 Units  SubCUTAneous  Q8H     ?  hydrALAZINE (APRESOLINE) 20 mg/mL injection 20 mg   20 mg  IntraVENous  Q6H PRN     ?  pantoprazole (PROTONIX) 40 mg in 0.9% sodium chloride 10 mL injection   40 mg  IntraVENous  DAILY     ?  dextrose (D50W) injection syrg 5-25 g   10-50 mL  IntraVENous  PRN     ?  docusate (COLACE) 50 mg/5 mL oral liquid 100 mg   100 mg  Oral  BID     ?  montelukast (SINGULAIR) tablet 10 mg   10 mg  Oral  QHS           ?  white petrolatum-mineral oiL (LACRILUBE S.O.P.) ointment 1 Each   1 Each   Both Eyes  DAILY                    Labs:   Reviewed.      Recent Results (from the past 24 hour(s))     GLUCOSE, POC          Collection Time: 05/20/21  6:32 PM         Result  Value  Ref Range            Glucose (POC)  283 (H)  65 - 105 mg/dL       GLUCOSE, POC          Collection Time: 05/20/21  9:26 PM         Result  Value  Ref Range            Glucose (POC)  216 (H)  65 - 105 mg/dL       CBC WITH AUTOMATED DIFF          Collection Time: 05/21/21  8:27 AM         Result  Value  Ref Range  WBC  15.5 (H)  4.0 - 11.0 1000/mm3       RBC  4.13  3.60 - 5.20 M/uL       HGB  11.3 (L)  13.0 - 17.2 gm/dl       HCT  91.4 (L)  78.2 - 50.0 %       MCV  89.1  80.0 - 98.0 fL       MCH  27.4  25.4 - 34.6 pg       MCHC  30.7  30.0 - 36.0 gm/dl       PLATELET  956  213 - 450 1000/mm3       MPV  12.8 (H)  6.0 - 10.0 fL       RDW-SD  53.0 (H)  36.4 - 46.3         NRBC  0  0 - 0         IMMATURE GRANULOCYTES  0.7  0.0 - 3.0 %       NEUTROPHILS  77.8 (H)  34 - 64 %       LYMPHOCYTES  11.7 (L)  28 - 48 %       MONOCYTES  9.2  1 - 13 %       EOSINOPHILS  0.5  0 - 5 %       BASOPHILS  0.1  0 - 3 %       GLUCOSE, POC          Collection Time: 05/21/21  8:57 AM         Result  Value  Ref Range            Glucose (POC)  161 (H)  65 - 105 mg/dL       GLUCOSE, POC          Collection Time: 05/21/21 12:08 PM         Result  Value  Ref Range            Glucose (POC)  153 (H)  65 - 105 mg/dL             Imaging:   I have personally reviewed imaging.             Shea Stakes, MD, FACP   Pulmonary & Critical Care   [facility] medical dictation software was used for portions of this report. Unintended voice transcription errors may have occurred.

## 2021-05-21 NOTE — Progress Notes (Signed)
 SPEECH LANGUAGE PATHOLOGY   BEDSIDE DYSPHAGIA TREATMENT      Patient: [name] [name] (70 y.o. female) Apr 23, 1951  Room: 5221/5221  Primary Diagnosis: Respiratory failure (HCC) [J96.90]    Procedure(s) (LRB):  BRONCHOSCOPY Bedside ICU-9 (N/A) 7 Days Post-Op    Isolation:  There are currently no Active Isolations       MDRO: No current active infections  PPE: surgical mask, gloves  Precautions:  universal and aspiration     Date: 05/21/2021  Start Time:  9:00 End Time:  9:17    TREATMENT:    Orientation: Pt awake, alert, able to follow contextual commands   Respiratory Status: O2 via NC, 2L    Treatment: Swallow tx completed b/s with tolerance/ safety of IDDSI 7- easy to chew, IDDSI 0- thin and use of compensatory swallow strategies.  Pt seen sitting up in bed upon SLPs arrival.  Pt with mild chronic cough prior to po presentation, with hx respiratory disease including COPD and emphysema. Dx trials of regular-easy to chew solids with mildly prolonged mastication.  Pt with slight increased WOB noted with cues to decrease rate, taking breaks between bites to regain breath control.  Swallow response appeared timely with thin liquids via small, single cup sips with no overt s/s suggestive of aspiration with clear vocal quality.  Continue IDDSI 7-easy to chew solids with thin liquids with use of compensatory swallow strategies.      Functional Oral Intake Scale: Level 6: Total oral diet of multiple consistencies without special preparation but with specific food limitations    PLAN/ RECOMMENDATIONS:                 Diet Recommendations: continue IDDSI 7- easy to chew, IDDSI 0- thin   Compensatory Swallow Strategies: HOB ~60* for all po feeds and >45* at least 30 minutes following, SMALL, SINGLE sips, small bites/ sip, slow rate of eating/ feeding, alternate solids/ liquids   Medication Administration: as tolerated   Aspiration Precautions: may feed self independently  Risk(s) for Aspiration: medical condition, COPD, current  level of dysphagia, s/p intubation     Therapy Recommendations: pt would benefit from continuation of SLP for dysphagia 1-3x/ week to address goals per POC    Therapy Prognosis: good    Discharge Recommendations: to be determined  Other Recommended Services: OT, PT, Dietitian     SUBJECTIVE:    Patient Stated: I keep leaning.   Pain Prior to Treatment: no pain reported   Pain Following Treatment: no pain reported     OBJECTIVE:    Position: upright in bed  Consistencies: thin single sips and via cup, easy to chew (IDDSI 7)   Oral Phase: decreased bolus manipulation/ control and mild prolonged mastication  Pharyngeal Phase: no overt/ soft/ silent s/sx aspiration noted following any trials  Exercises:  n/a    Safety: Following session, pt tray table, phone, and call bell within reach   Modified Rankin Score (MRS): Defer to OT/ PT    EDUCATION:   Education Provided: POC, diet recommendations, and compensatory strategies for swallowing  Individual Educated: patient  Comprehension: pt communicated comprehension and query pt's level carry over    Thank you,      Isaiah Lima MS CCC-SLP  Speech-Language Pathologist  [phone] or Secure Chat

## 2021-05-21 NOTE — Progress Notes (Signed)
 NUTRITION RECOMMENDATIONS:   Recommend continue CCHO5, Easy to [employer]. Diet consistency per SLP.   Recommend Ensure Max Protein daily (provides 150 kcal, 30 g protein each). Pt prefers chocolate.   Recommend BG control <180 mg/dL.   Continue to monitor lytes and replete prn.   Daily wts to trend.   Discharge recommendations: CCHO (diet consistency per SLP)    NUTRITION FOLLOW UP    Current Diet Order: ADULT DIET Easy to Chew; 5 carb choices (75 gm/meal)    Current Intake: []  N/A- NPO    []  very poor     []  poor      [x]  fair      [x]  good  Receiving TF of Vital HP 12/2-12/5. Pt NPO 12/5-12/6. Soft and bite sized diet initiated per SLP 12/7. Advanced to easy to chew 12/8. Last meal documented on 12/7 of 76-100%. Provides 1360 kcal (72%), 72 g protein (78%) of pt's estimated needs. Pt reports good appetite, eating 75% of meal trays. Pt agreeable to chocolate Ensure Max.      Pertinent Medications: norvasc , colace, heparin , lantus , humalog , protonix , zosyn , deltasone     Pertinent Labs: 12/9 POC BG 161-283 (on glucommander), K 3.3 L (rec replete)    Weight: Initial admit bedscale wt of 94.2 kg (12/1). Lowest BW this admit. Pt currently -2.5 L per I/O.   Last 3 Recorded Weights in this Encounter    05/15/21 0530 05/16/21 0313 05/18/21 0400   Weight: 95.3 kg (210 lb 1.6 oz) 95.7 kg (210 lb 15.7 oz) 94.5 kg (208 lb 5.4 oz)       BMI: Body mass index is 32.63 kg/m.    Estimated Daily Nutrition Needs: 94.2 kg admit wt, 61.6 kg IBW  1884 - 2355 kcal (20-25 kcal/kg)  92 - 123 g protein (1.5-2 g/kg IBW)  1880 mL fluid (20 ml/kg or per MD)    Physical Assessment:  GI Symptoms:   Pt denies N/V, diarrhea, and constipation  Last Bowel Movement Date: 05/20/21  Stool Appearance: Soft  Abdominal Assessment: Obese, Soft  Bowel Sounds: Active   Chewing/Swallowing Issues:   Intubated 11/29, extubated 12/5  Per SLP 12/9, Diet Recommendations: continue IDDSI 7- easy to chew, IDDSI 0- thin  Skin Integrity:   Intact per  flowsheets  Fluid Accumulation:   Edema  Generalized: Trace  LUE: 1+  LLE: 1+  RUE: 2+  RLE: 2+    Physical Assessment: No overt s/sx of muscle or fat wasting.     Assessment of Current MNT: Diet is adequate if intake averages 75% at most meals - recommend adding oral supplement daily to increase protein-calorie intake opportunity.      Nutrition Diagnosis:   Inadequate oral intake related to acute hypoxic hypercapnic respiratory failure s/p extubation as evidenced by pt NPO. (Resolved)     Altered nutrition related labs related to endocrine dysfunction as evidenced by elevated blood glucose, hemoglobin A1c of 6.5% on 04/03/2021. (Continues)    Nutrition Recommendation:   Recommend continue CCHO5, Easy to [employer]. Diet consistency per SLP.   Recommend Ensure Max Protein daily (provides 150 kcal, 30 g protein each). Pt prefers chocolate.   Recommend BG control <180 mg/dL.   Continue to monitor lytes and replete prn.   Daily wts to trend.   Discharge recommendations: CCHO (diet consistency per SLP)    Monitoring and Evaluation: Wt trend, PO intake/, nutrition-related labs, and s/sx of new skin concerns; will f/u per policy.    Nutrition Goals:  Pt to meet/tolerate >75% of estimated needs, maintain weight throughout LOS, BG control <180, maintain skin integrity.    Nutrition Level of Care: []  Low     [x]  Moderate     []  High    Progress Towards Nutrition Goals: []   Met/Ongoing     []   Progressing Appropriately     [x]   Progressing Slowly     []   Not Progressing    Code Status: Full Code       Sid Cork, MS, RD  Pager: [phone]  Office: [phone]

## 2021-05-21 NOTE — Progress Notes (Signed)
Progress Notes by Salley Slaughter, MD at 05/21/21 1324                Author: Salley Slaughter, MD  Service: Hospitalist  Author Type: Physician       Filed: 05/21/21 1512  Date of Service: 05/21/21 1324  Status: Signed          Editor: Salley Slaughter, MD (Physician)                          INTERNAL MEDICINE PROGRESS NOTE      Date of note:      May 21, 2021      Patient:               [name] [name], 70 y.o., female   Admit Date:        05/11/2021   Length of Stay:  10 day(s)      Problem List:      Patient Active Problem List        Diagnosis  Code         ?  Severe hypertension  I10     ?  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  PTSD (post-traumatic stress disorder)  F43.10     ?  Severe headache  R51.9     ?  Accelerated hypertension  I10     ?  Acute colitis  K52.9     ?  Acute hyperglycemia  R73.9     ?  Uncontrolled type 2 diabetes mellitus with hyperglycemia (HCC)  E11.65     ?  Spondylosis of lumbar region without myelopathy or radiculopathy  M47.816     ?  Lumbar and sacral osteoarthritis  M47.817     ?  Chronic pain syndrome  G89.4     ?  Sacroiliitis (HCC)  M46.1     ?  Type 2 diabetes mellitus with nephropathy (HCC)  E11.21     ?  Lactic acidosis  E87.20     ?  Leukocytosis  D72.829     ?  Sepsis (HCC)  A41.9     ?  Asthma exacerbation  J45.901     ?  Asthma with acute exacerbation  J45.901     ?  Gastritis  K29.70     ?  Nausea & vomiting  R11.2     ?  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  Cannabinoid hyperemesis syndrome  R11.2, F12.90     ?  Type 2 diabetes mellitus with diabetic neuropathy (HCC)  E11.40     ?  Chest pain  R07.9     ?  Dyspnea  R06.00     ?  Dehydration  E86.0     ?  COPD with acute exacerbation (HCC)  J44.1     ?  Acute-on-chronic kidney injury (HCC)  N17.9, N18.9     ?  Hyperkalemia  E87.5     ?  Obtunded  R40.1     ?  Acute renal failure (ARF) (HCC)  N17.9     ?  Septic shock (HCC)  A41.9, R65.21     ?  Metabolic encephalopathy  G93.41      ?  SIRS (systemic inflammatory response syndrome) (HCC)  R65.10     ?  Abdominal pain  R10.9     ?  Headache  R51.9     ?  Chronic respiratory failure with hypoxia (HCC)  J96.11     ?  COPD exacerbation (HCC)  J44.1     ?  Acute hypoxemic respiratory failure (HCC)  J96.01     ?  UTI (urinary tract infection)  N39.0     ?  Acute CHF (congestive heart failure) (HCC)  I50.9         ?  Respiratory failure (HCC)  J96.90             Subjective + interval history        Resting comfortably. Now on 2-3 L supplemental oxygen   Wore BiPAP for 2 hrs last night         Assessment :         1.  Acute on chronic hypoxic hypercapnic respiratory failure requiring intubation on  05/11/21 -> extubated on 05/17/21   2.  Community-acquired pneumonia with left mucous plugging/atelectasis, s/p bronchoscopy with 05/16/21- mild secretions, irritation left airways, no mucous plug, dynamic collapse Sup segt LLL -> cultures negative, cytology negative for malignant cells   3.  Blood cultures 1 out of 2 positive for Corynebacterium, likely procurement contamination   4.  Hemoptysis secondary to pneumonia   5.  Acute COPD exacerbation - on 3L supplemental oxygen at baseline   6.  Bullous emphysema   7.  Hyperkalemia   8.  Leukocytosis,    9.  Bullous emphysema   10.  Hypertension   11.  Dyslipidemia   12.  Type 2 diabetes mellitus   13.  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  Chronic pain   15.  2 cm left lower lobe lung nodule-will need outpatient follow-up   16.  UDS positive for marijuana   17.  Diabetic neuropathy   18.  Physical deconditioning. Bedridden at baseline        Plan :         Extubated on 12/5   Continue supplemental oxygen to maintain SPO2 > 88% - BiPAP PRN and nightly   ECHO shows normal EF 61%, normal diastolic function   Reactive leukocytosis is improving   Continue nebulized bronchodilator every 6 hours, oral prednisone 40 mg daily for 5  days   Continue Singulair 10 mg daily.  Completed IV zosyn -  bronch cultures are negative so far   Would need sleep apnea testing as outpatient          PT OT  as tolerated. Patient does not want to go to SNF and would rather go home with home health          DVT ppx : Subcutaneous heparin ( left upper and bilateral lower extremity venous PVL was negative for DVT ) - eliquis was held for bronchoscopy and due to mild hemoptysis   Completed 3 months of eliquis.  GI prophylaxis-Protonix        - Code status: full code            Objective:        Visit Vitals      BP  (!) 142/90 (BP 1 Location: Right upper arm, BP Patient Position: Supine)     Pulse  80     Temp  97.3 ??F (36.3 ??C)     Resp  18     Ht  5\' 7"  (1.702 m)     Wt  94.5 kg (208 lb 5.4 oz)  SpO2  97%        BMI  32.63 kg/m??                    Intake/Output Summary (Last 24 hours) at 05/21/2021 1324   Last data filed at 05/21/2021 0750     Gross per 24 hour        Intake  240 ml        Output  1100 ml        Net  -860 ml             Physical Exam:        GEN -alert, oriented to self and place, time, not in distress   HEENT - mucous membranes moist   Neck - supple, no JVD   Cardiac - RRR, S1, S2, no murmurs   Chest/Lungs - minimal expiratory wheezing   Abdomen - soft, obese, non tender   Extremities - no clubbing/ cyanosis/ edema   Neuro -follows commands, moves all 4 extremities   Skin - no rashes or lesions        Current medications:           Current Facility-Administered Medications:    ?  ondansetron (ZOFRAN) injection 4 mg, 4 mg, IntraVENous, Q6H PRN, Jabria Loos S, MD   ?  predniSONE (DELTASONE) tablet 40 mg, 40 mg, Oral, DAILY WITH BREAKFAST, Shea Stakes, MD, 40 mg at 05/21/21 2956   ?  albuterol-ipratropium (DUO-NEB) 2.5 MG-0.5 MG/3 ML, 3 mL, Nebulization, Q6H RT, Shea Stakes, MD, 3 mL at 05/21/21 0800   ?  glucagon (GLUCAGEN) injection 1 mg, 1 mg, IntraMUSCular, PRN, Evoleth Nordmeyer S, MD   ?  insulin glargine (LANTUS) injection 1-100 Units,  1-100 Units, SubCUTAneous, QHS, Sharmila Wrobleski S, MD, 14 Units at 05/20/21 2200   ?  insulin lispro (HUMALOG) injection 1-100 Units, 1-100 Units, SubCUTAneous, AC&HS, Jovi Alvizo S, MD, 5 Units at 05/21/21 1304   ?  insulin lispro (HUMALOG) injection 1-100 Units, 1-100 Units, SubCUTAneous, PRN, Guadalupe Nickless S, MD   ?  acetaminophen (TYLENOL) tablet 650 mg, 650 mg, Oral, Q6H PRN, Almadelia Looman S, MD, 650 mg at 05/21/21 2130   ?  fluticasone-vilanterol (BREO ELLIPTA) 260mcg-25mcg/puff, 1 Puff, Inhalation, DAILY, Zoe Lan, MD, 1 Puff at 05/21/21 1220   ?  tiotropium bromide (SPIRIVA RESPIMAT) 2.5 mcg Salvadore Farber, 2 [place], Inhalation, DAILY, Zoe Lan, MD, 2 Puff at 05/21/21 1220   ?  cloNIDine HCL (CATAPRES) tablet 0.2 mg, 0.2 mg, Oral, BID, Zoe Lan, MD, 0.2 mg at 05/21/21 0933   ?  amLODIPine (NORVASC) tablet 10 mg, 10 mg, Oral, DAILY, Reid, Hailey, PA-C, 10 mg at 05/21/21 8657   ?  heparin (porcine) injection 5,000 Units, 5,000 Units, SubCUTAneous, Q8H, Imad, Melhem, MD, 5,000 Units at 05/21/21 1305   ?  hydrALAZINE (APRESOLINE) 20 mg/mL injection 20 mg, 20 mg, IntraVENous, Q6H PRN, Lottie Dawson, NP, 20 mg at 05/19/21 0443   ?  pantoprazole (PROTONIX) 40 mg in 0.9% sodium chloride 10 mL injection, 40 mg, IntraVENous, DAILY, Gar Gibbon C, NP, 40 mg at 05/21/21  0932   ?  dextrose (D50W) injection syrg 5-25 g, 10-50 mL, IntraVENous, PRN, Lottie Dawson, NP   ?  docusate (COLACE) 50 mg/5 mL oral liquid 100 mg, 100 mg, Oral, BID, Imad, Melhem, MD, 100 mg at 05/21/21 0943   ?  piperacillin-tazobactam (ZOSYN) 3.375 g in 0.9% sodium chloride (MBP/ADV) 100 mL MBP,  3.375 g, IntraVENous, Q8H, Erling Conte, MD, Last  Rate: 25 mL/hr at 05/21/21 0943, 3.375 g at 05/21/21 0943   ?  montelukast (SINGULAIR) tablet 10 mg, 10 mg, Oral, QHS, Lauralee Evener, PA-C, 10 mg at 05/20/21 2123   ?  white petrolatum-mineral oiL (LACRILUBE S.O.P.) ointment 1 Each, 1 Each, Both Eyes, DAILY, Erling Conte, MD, 1 Each at 05/21/21 1220        Labs:          Recent Results (from the past 24 hour(s))     COVID-19 (INPATIENT TESTING)          Collection Time: 05/20/21  2:56 PM       Specimen: NASOPHARYNGEAL SWAB         Result  Value  Ref Range            COVID-19  NEGATIVE  NEGATIVE         GLUCOSE, POC          Collection Time: 05/20/21  6:32 PM         Result  Value  Ref Range            Glucose (POC)  283 (H)  65 - 105 mg/dL       GLUCOSE, POC          Collection Time: 05/20/21  9:26 PM         Result  Value  Ref Range            Glucose (POC)  216 (H)  65 - 105 mg/dL       CBC WITH AUTOMATED DIFF          Collection Time: 05/21/21  8:27 AM         Result  Value  Ref Range            WBC  15.5 (H)  4.0 - 11.0 1000/mm3            RBC  4.13  3.60 - 5.20 M/uL       HGB  11.3 (L)  13.0 - 17.2 gm/dl       HCT  16.1 (L)  09.6 - 50.0 %       MCV  89.1  80.0 - 98.0 fL       MCH  27.4  25.4 - 34.6 pg       MCHC  30.7  30.0 - 36.0 gm/dl       PLATELET  045  409 - 450 1000/mm3       MPV  12.8 (H)  6.0 - 10.0 fL       RDW-SD  53.0 (H)  36.4 - 46.3         NRBC  0  0 - 0         IMMATURE GRANULOCYTES  0.7  0.0 - 3.0 %       NEUTROPHILS  77.8 (H)  34 - 64 %       LYMPHOCYTES  11.7 (L)  28 - 48 %       MONOCYTES  9.2  1 - 13 %       EOSINOPHILS  0.5  0 - 5 %       BASOPHILS  0.1  0 - 3 %       GLUCOSE, POC          Collection Time: 05/21/21  8:57 AM  Result  Value  Ref Range            Glucose (POC)  161 (H)  65 - 105 mg/dL       GLUCOSE, POC          Collection Time: 05/21/21 12:08 PM         Result  Value  Ref Range            Glucose (POC)  153 (H)  65 - 105 mg/dL           XR Results:   Results from Hospital Encounter encounter on 05/11/21      XR CHEST SNGL V      Narrative   Chest      Indication: SOB   Comparison: 05/17/2021      Findings:      AP portable upright view of the chest demonstrates that the cardiac silhouette   is not  enlarged. Stable left upper lobe bulla. Interval worsening of right   lower lobe  airspace disease. Stable left mid and basilar airspace disease.      Impression   Impression:      Worsening right lower lobe airspace disease. Stable left lung airspace disease      Electronically signed by: Jodi Mourning, MD 05/18/2021 9:42 AM EST         CT Results:   Results from Hospital Encounter encounter on 05/11/21      CT HEAD WO CONT      Narrative   Examination: CT head without contrast.      INDICATION: AMS and headache      COMPARISON: 2020      TECHNIQUE: CT head without contrast. DICOM format image data is available to   non-affiliated external healthcare facilities or entities on a secure, media   free, reciprocally searchable basis with patient authorization for 12 months   following the date of the study.      All CT exams at this facility use one or more dose reduction techniques   including automatic exposure control, mA/kV adjustment per patient's size, or   iterative reconstruction technique.      WORKSTATION ID: [id number]      FINDINGS:      No focal mass, midline shift or acute intracranial hemorrhage.      Prominent diffuse parenchymal volume loss and advanced chronic small vessel   ischemic change.      A stable small left posterior cranial fossa, petrous ridge meningioma measuring   1.1 cm in maximal dimension.      Paranasal sinuses, mastoid air cells and middle ear cavities grossly   unremarkable. Mild dehiscence of the right lamina papyracea, which may reflect   radiation normal anatomy versus sequela of previous injury.      Impression   IMPRESSION:   1. No acute intracranial abnormalities.   2. Diffuse parenchymal volume loss and advanced chronic small vessel ischemic   change.   3. Stable small left petrous ridge meningioma.      Electronically signed by: Mikal Plane, MD 05/19/2021 6:33 PM EST                        VAS/US Results:   Results from Hospital Encounter encounter on 05/11/21      DUPLEX LOWER EXT VENOUS BILAT      Narrative   [id number]   [id number]   [id number]   Study ID:  [id number]  [facility]   [address] [place],   [place]   [address]      Lower Extremity Venous Report      Name: LONETTE, STEVISON Date: 05/17/2021 09:32 AM   MRN: [id number]                   Patient Location: [id number]   DOB: 03/24/1951               Age: 70 yrs   Gender: Female                Account #: [id number]   Reason For Study: acute hypoxic respiratory failure. ICD-10   J96.01   Ordering Physician: Vickey Huger   Referring Physician: [name]   Performed By: Miles Costain, RVT      Interpretation Summary   1.No evidence of deep vein thrombosis in the bilateral lower extremities.   2. In comparison to exam perfomed on 08/22/2019, no significant change is   noted.   ___________________________________________________________________________   QUALITY/PROCEDURE   Complete bilateral venous duplex performed.      HISTORY/SYMPTOMS   Diabetes. COPD. HTN. previous left subclavian DVT.      RIGHT LEG   The right common femoral, femoral, popliteal, posterior tibial and   peroneal veins were examined with duplex ultrasound.   The deep veins were patent and compressible with no evidence of   intraluminal thrombus. Spontaneous, phasic venous   flow with normal augmentation was noted throughout the right leg.      RIGHT SAPHENOUS VEINS   Right great saphenous vein at the saphenofemoral junction demonstrates   normal compressibility with spontaneous and   phasic venous hemodynamics.      LEFT LEG   The left common femoral, femoral, popliteal, posterior tibial and peroneal   veins were examined with duplex ultrasound.   The deep veins were patent and compressible with no evidence of   intraluminal thrombus. Spontaneous, phasic venous   flow with normal augmentation was noted throughout the left leg.      LEFT LEG SAPHENOUS VEINS   Left great saphenous vein at the saphenofemoral junction demonstrates   normal compressibility with spontaneous and    phasic venous hemodynamics.      Electronically signed byDr. Neta Ehlers, M.D   05/17/2021 12:00 PM            Total clinical care time was 36  minutes of which more than 50% was spent in coordination of care and counseling (time spent with patient/family face to face, physical exam, reviewing laboratory and imaging investigations, speaking with physicians and  nursing staff involved in this patient's care).       Dragon medical dictation system was used for part of this note. Unintentional voice recognition errors may occur      Tia Masker,  MD   [facility]   May 21, 2021   Time: 1:25 PM

## 2021-05-21 NOTE — Progress Notes (Signed)
Case Manager continue to monitor for discharge needs. At this time DC paln is home with home health .

## 2021-05-21 NOTE — Progress Notes (Signed)
Problem: Pressure Injury - Risk of  Goal: *Prevention of pressure injury  Description: Document Braden Scale and appropriate interventions in the flowsheet.  Outcome: Progressing Towards Goal  Note: Pressure Injury Interventions:  Sensory Interventions: Assess changes in LOC    Moisture Interventions: Absorbent underpads    Activity Interventions: Pressure redistribution bed/mattress(bed type)    Mobility Interventions: HOB 30 degrees or less    Nutrition Interventions: Document food/fluid/supplement intake    Friction and Shear Interventions: HOB 30 degrees or less                Problem: Patient Education: Go to Patient Education Activity  Goal: Patient/Family Education  Outcome: Progressing Towards Goal     Problem: Falls - Risk of  Goal: *Absence of Falls  Description: Document Schmid Fall Risk and appropriate interventions in the flowsheet.  Outcome: Progressing Towards Goal  Note: Fall Risk Interventions:  Mobility Interventions: Bed/chair exit alarm    Mentation Interventions: Bed/chair exit alarm    Medication Interventions: Bed/chair exit alarm    Elimination Interventions: Call light in reach, Bed/chair exit alarm              Problem: Patient Education: Go to Patient Education Activity  Goal: Patient/Family Education  Outcome: Progressing Towards Goal

## 2021-05-22 LAB — BASIC METABOLIC PANEL
Anion Gap: 8 mmol/L (ref 5–15)
BUN: 11 mg/dl (ref 9–23)
CO2: 34 mEq/L — ABNORMAL HIGH (ref 20–31)
Calcium: 8.9 mg/dl (ref 8.7–10.4)
Chloride: 99 mEq/L (ref 98–107)
Creatinine: 0.49 mg/dl — ABNORMAL LOW (ref 0.55–1.02)
EGFR IF NonAfrican American: >60
GFR African American: >60
Glucose: 175 mg/dl — ABNORMAL HIGH (ref 74–106)
Potassium: 3.1 mEq/L — ABNORMAL LOW (ref 3.5–5.1)
Sodium: 140 mEq/L (ref 136–145)

## 2021-05-22 LAB — POCT GLUCOSE
POC Glucose: 175 mg/dL — ABNORMAL HIGH (ref 65–105)
POC Glucose: 167 mg/dL — ABNORMAL HIGH (ref 65–105)
POC Glucose: 197 mg/dL — ABNORMAL HIGH (ref 65–105)
POC Glucose: 234 mg/dL — ABNORMAL HIGH (ref 65–105)

## 2021-05-22 LAB — GLUCOSE, POC
Glucose (POC): 175 mg/dL — ABNORMAL HIGH (ref 65–105)
Glucose (POC): 167 mg/dL — ABNORMAL HIGH (ref 65–105)
Glucose (POC): 197 mg/dL — ABNORMAL HIGH (ref 65–105)
Glucose (POC): 234 mg/dL — ABNORMAL HIGH (ref 65–105)

## 2021-05-22 LAB — METABOLIC PANEL, BASIC
Anion gap: 8 mmol/L (ref 5–15)
BUN: 11 mg/dl (ref 9–23)
CO2: 34 mEq/L — ABNORMAL HIGH (ref 20–31)
Calcium: 8.9 mg/dl (ref 8.7–10.4)
Chloride: 99 mEq/L (ref 98–107)
Creatinine: 0.49 mg/dl — ABNORMAL LOW (ref 0.55–1.02)
GFR est AA: >60
GFR est non-AA: >60
Glucose: 175 mg/dl — ABNORMAL HIGH (ref 74–106)
Potassium: 3.1 mEq/L — ABNORMAL LOW (ref 3.5–5.1)
Sodium: 140 mEq/L (ref 136–145)

## 2021-05-22 MED ORDER — POTASSIUM CHLORIDE SR 20 MEQ TAB, PARTICLES/CRYSTALS
20 mEq | Freq: Two times a day (BID) | ORAL | Status: AC
Start: 2021-05-22 — End: 2021-05-23
  Administered 2021-05-22 – 2021-05-23 (×3): via ORAL

## 2021-05-22 MED FILL — HEPARIN (PORCINE) 5,000 UNIT/ML IJ SOLN: 5000 unit/mL | INTRAMUSCULAR | Qty: 1

## 2021-05-22 MED FILL — DOCUSATE SODIUM 50 MG/5 ML ORAL LIQUID: 50 mg/5 mL | ORAL | Qty: 10

## 2021-05-22 MED FILL — PREDNISONE 20 MG TAB: 20 mg | ORAL | Qty: 2

## 2021-05-22 MED FILL — MONTELUKAST 10 MG TAB: 10 mg | ORAL | Qty: 1

## 2021-05-22 MED FILL — AMLODIPINE 5 MG TAB: 5 mg | ORAL | Qty: 2

## 2021-05-22 MED FILL — TYLENOL 325 MG TABLET: 325 mg | ORAL | Qty: 2

## 2021-05-22 MED FILL — PANTOPRAZOLE 40 MG IV SOLR: 40 mg | INTRAVENOUS | Qty: 40

## 2021-05-22 MED FILL — IPRATROPIUM-ALBUTEROL 2.5 MG-0.5 MG/3 ML NEB SOLUTION: 2.5 mg-0.5 mg/3 ml | RESPIRATORY_TRACT | Qty: 3

## 2021-05-22 MED FILL — CLONIDINE 0.2 MG TAB: 0.2 mg | ORAL | Qty: 1

## 2021-05-22 MED FILL — POTASSIUM CHLORIDE SR 20 MEQ TAB, PARTICLES/CRYSTALS: 20 mEq | ORAL | Qty: 1

## 2021-05-22 NOTE — Progress Notes (Signed)
Pt's husband requesting to speak with CM regarding discharge plan. Confirmed medical transport and HHPT. Family requesting [facility].

## 2021-05-22 NOTE — Progress Notes (Signed)
BiPAP Progress Note    Patient currently is Off BiPAP  Patient tolerated BiPAP for the PM shift    BiPAP QHS; pt agreed to wear BiPaP for 1 hour last night and refused to wear it longer.     BiPAP settings are:  Bio-Med ID #: [id number]  IPAP:  18 cm H2O   EPAP: 10 cm H2O  Rate:   10  FiO2:   30 %     BiPAP will be removed from room after 48 hours of non-use

## 2021-05-22 NOTE — Progress Notes (Signed)
Progress Notes by Salley Slaughter, MD at 05/22/21 1244                Author: Salley Slaughter, MD  Service: Hospitalist  Author Type: Physician       Filed: 05/22/21 1503  Date of Service: 05/22/21 1244  Status: Signed          Editor: Salley Slaughter, MD (Physician)                          INTERNAL MEDICINE PROGRESS NOTE      Date of note:      May 22, 2021      Patient:               [name] [name], 70 y.o., female   Admit Date:        05/11/2021   Length of Stay:  11 day(s)      Problem List:      Patient Active Problem List        Diagnosis  Code         ?  Severe hypertension  I10     ?  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  PTSD (post-traumatic stress disorder)  F43.10     ?  Severe headache  R51.9     ?  Accelerated hypertension  I10     ?  Acute colitis  K52.9     ?  Acute hyperglycemia  R73.9     ?  Uncontrolled type 2 diabetes mellitus with hyperglycemia (HCC)  E11.65     ?  Spondylosis of lumbar region without myelopathy or radiculopathy  M47.816     ?  Lumbar and sacral osteoarthritis  M47.817     ?  Chronic pain syndrome  G89.4     ?  Sacroiliitis (HCC)  M46.1     ?  Type 2 diabetes mellitus with nephropathy (HCC)  E11.21     ?  Lactic acidosis  E87.20     ?  Leukocytosis  D72.829     ?  Sepsis (HCC)  A41.9     ?  Asthma exacerbation  J45.901     ?  Asthma with acute exacerbation  J45.901     ?  Gastritis  K29.70     ?  Nausea & vomiting  R11.2     ?  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  Cannabinoid hyperemesis syndrome  R11.2, F12.90     ?  Type 2 diabetes mellitus with diabetic neuropathy (HCC)  E11.40     ?  Chest pain  R07.9     ?  Dyspnea  R06.00     ?  Dehydration  E86.0     ?  COPD with acute exacerbation (HCC)  J44.1     ?  Acute-on-chronic kidney injury (HCC)  N17.9, N18.9     ?  Hyperkalemia  E87.5     ?  Obtunded  R40.1     ?  Acute renal failure (ARF) (HCC)  N17.9     ?  Septic shock (HCC)  A41.9, R65.21     ?  Metabolic encephalopathy   G93.41     ?  SIRS (systemic inflammatory response syndrome) (HCC)  R65.10     ?  Abdominal pain  R10.9     ?  Headache  R51.9     ?  Chronic respiratory failure with hypoxia (HCC)  J96.11     ?  COPD exacerbation (HCC)  J44.1     ?  Acute hypoxemic respiratory failure (HCC)  J96.01     ?  UTI (urinary tract infection)  N39.0     ?  Acute CHF (congestive heart failure) (HCC)  I50.9         ?  Respiratory failure (HCC)  J96.90             Subjective + interval history        No new overnight events. Used BiPAP for only 1 hour last night. Got super tired with getting cleaned up in bed with the help of care partner.        Assessment :         1.  Acute on chronic hypoxic hypercapnic respiratory failure requiring intubation on  05/11/21 -> extubated on 05/17/21   2.  Community-acquired pneumonia with left mucous plugging/atelectasis, s/p bronchoscopy with 05/16/21- mild secretions, irritation left airways, no mucous plug, dynamic collapse Sup segt LLL -> cultures negative, cytology negative for malignant cells   3.  Blood cultures 1 out of 2 positive for Corynebacterium, likely procurement contamination   4.  Hemoptysis secondary to pneumonia   5.  Acute COPD exacerbation - on 3L supplemental oxygen at baseline   6.  Bullous emphysema   7.  Hyperkalemia   8.  Leukocytosis,    9.  Bullous emphysema   10.  Hypertension   11.  Dyslipidemia   12.  Type 2 diabetes mellitus   13.  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  Chronic pain   15.  2 cm left lower lobe lung nodule-will need outpatient follow-up   16.  UDS positive for marijuana   17.  Diabetic neuropathy   18.  Physical deconditioning. Bedridden at baseline         Plan :         Extubated on 12/5   Continue supplemental oxygen to maintain SPO2 > 88% - BiPAP PRN and nightly   ECHO shows normal EF 61%, normal diastolic function   Reactive leukocytosis is improving   Continue nebulized  bronchodilator every 6 hours, oral prednisone 40 mg daily for 5 days   Continue Singulair 10 mg daily.  Completed IV zosyn -  bronch cultures are negative so far   Would need sleep apnea testing as outpatient        patient is very deconditioned but does not want to go to SNF. She wants to go home with home health        DVT ppx : Subcutaneous heparin ( left upper and bilateral lower extremity venous PVL was negative for DVT ) - eliquis was held for bronchoscopy and due to mild hemoptysis   Completed 3 months of eliquis.  GI prophylaxis-Protonix        Discussed with patient's husband at bedside   - Code status: full code   Discharge home with home health tomorrow.        Objective:           Visit Vitals      BP  (!) 166/98 (BP 1 Location: Right upper arm, BP Patient Position: Sitting)     Pulse  88     Temp  97.3 ??F (36.3 ??C)     Resp  20  Ht   (1.702 m)     Wt  94.5 kg (208 lb 5.4 oz)     SpO2  95%        BMI  32.63 kg/m??                       Intake/Output Summary (Last 24 hours) at 05/22/2021 1244   Last data filed at 05/22/2021 1610     Gross per 24 hour        Intake  --        Output  1475 ml        Net  -1475 ml             Physical Exam:        GEN -alert, oriented to self and place, time, not in distress   HEENT - mucous membranes moist   Neck - supple, no JVD   Cardiac - RRR, S1, S2, no murmurs   Chest/Lungs - minimal expiratory wheezing   Abdomen - soft, obese, non tender   Extremities - no clubbing/ cyanosis/ edema   Neuro -follows commands, moves all 4 extremities   Skin - no rashes or lesions        Current medications:           Current Facility-Administered Medications:    ?  albuterol-ipratropium (DUO-NEB) 2.5 MG-0.5 MG/3 ML, 3 mL, Nebulization, Q6H PRN, Shea Stakes, MD   ?  ondansetron (ZOFRAN) injection 4 mg, 4 mg, IntraVENous, Q6H PRN, Mckinley Adelstein S, MD   ?  predniSONE (DELTASONE) tablet 40 mg, 40 mg, Oral, DAILY WITH BREAKFAST, Shea Stakes, MD, 40 mg at 05/22/21 1010   ?   glucagon (GLUCAGEN) injection 1 mg, 1 mg, IntraMUSCular, PRN, Kona Yusuf S, MD   ?  insulin glargine (LANTUS) injection 1-100 Units, 1-100 Units, SubCUTAneous, QHS, Drucilla Cumber S, MD, 14 Units at 05/21/21 2200   ?  insulin lispro (HUMALOG) injection 1-100 Units, 1-100 Units, SubCUTAneous, AC&HS, Vernestine Brodhead S, MD, 5 Units at 05/22/21 1016   ?  insulin lispro (HUMALOG) injection 1-100 Units, 1-100 Units, SubCUTAneous, PRN, Lakaya Tolen S, MD   ?  acetaminophen (TYLENOL) tablet 650 mg, 650 mg, Oral, Q6H PRN, Othman Masur S, MD, 650 mg at 05/22/21 0238   ?  fluticasone-vilanterol (BREO ELLIPTA) 234mcg-25mcg/puff, 1 Puff, Inhalation, DAILY, Zoe Lan, MD, 1 Puff at 05/22/21 1017   ?  tiotropium bromide (SPIRIVA RESPIMAT) 2.5 mcg Salvadore Farber, 2 [place], Inhalation, DAILY, Zoe Lan, MD, 2 Puff at 05/22/21 1017   ?  cloNIDine HCL (CATAPRES) tablet 0.2 mg, 0.2 mg, Oral, BID, Faythe Ghee C, MD, 0.2 mg at 05/22/21 1010   ?  amLODIPine (NORVASC) tablet 10 mg, 10 mg, Oral, DAILY, Reid, Hailey, PA-C, 10 mg at 05/22/21 1010   ?  heparin (porcine) injection 5,000 Units, 5,000 Units, SubCUTAneous, Q8H, Imad, Melhem, MD, 5,000 Units at 05/22/21 0558   ?  hydrALAZINE (APRESOLINE) 20 mg/mL injection 20 mg, 20 mg, IntraVENous, Q6H PRN, Lottie Dawson, NP, 20 mg at 05/19/21 0443   ?  pantoprazole (PROTONIX) 40 mg in 0.9% sodium chloride 10 mL injection, 40 mg, IntraVENous, DAILY, Gar Gibbon C, NP, 40 mg at 05/22/21  1011   ?  dextrose (D50W) injection syrg 5-25 g, 10-50 mL, IntraVENous, PRN, Lottie Dawson, NP   ?  docusate (COLACE) 50 mg/5 mL oral liquid 100 mg, 100 mg, Oral, BID, Imad, Melhem, MD, 100 mg  at 05/22/21 1010   ?  montelukast (SINGULAIR) tablet 10 mg, 10 mg, Oral, QHS, Reid, Hailey, PA-C, 10 mg at 05/21/21 2209   ?  white petrolatum-mineral oiL (LACRILUBE S.O.P.) ointment 1 Each, 1 Each, Both Eyes, DAILY, Erling Conte, MD, 1 Each at 05/22/21 0900        Labs:          Recent  Results (from the past 24 hour(s))     GLUCOSE, POC          Collection Time: 05/21/21  5:47 PM         Result  Value  Ref Range            Glucose (POC)  207 (H)  65 - 105 mg/dL       GLUCOSE, POC          Collection Time: 05/21/21  9:45 PM         Result  Value  Ref Range            Glucose (POC)  175 (H)  65 - 105 mg/dL       METABOLIC PANEL, BASIC          Collection Time: 05/22/21  7:28 AM         Result  Value  Ref Range            Potassium  3.1 (L)  3.5 - 5.1 mEq/L       Chloride  99  98 - 107 mEq/L       Sodium  140  136 - 145 mEq/L            CO2  34 (H)  20 - 31 mEq/L            Glucose  175 (H)  74 - 106 mg/dl       BUN  11  9 - 23 mg/dl       Creatinine  4.23 (L)  0.55 - 1.02 mg/dl       GFR est AA  >53.6          GFR est non-AA  >60          Calcium  8.9  8.7 - 10.4 mg/dl       Anion gap  8  5 - 15 mmol/L       GLUCOSE, POC          Collection Time: 05/22/21  8:17 AM         Result  Value  Ref Range            Glucose (POC)  167 (H)  65 - 105 mg/dL           XR Results:   Results from Hospital Encounter encounter on 05/11/21      XR CHEST SNGL V      Narrative   Chest      Indication: SOB   Comparison: 05/17/2021      Findings:      AP portable upright view of the chest demonstrates that the cardiac silhouette   is not  enlarged. Stable left upper lobe bulla. Interval worsening of right   lower lobe airspace disease. Stable left mid and basilar airspace disease.      Impression   Impression:      Worsening right lower lobe airspace disease. Stable left lung airspace disease      Electronically signed by: Jodi Mourning, MD 05/18/2021 9:42 AM EST  CT Results:   Results from Hospital Encounter encounter on 05/11/21      CT HEAD WO CONT      Narrative   Examination: CT head without contrast.      INDICATION: AMS and headache      COMPARISON: 2020      TECHNIQUE: CT head without contrast. DICOM format image data is available to   non-affiliated external healthcare facilities or entities on a secure,  media   free, reciprocally searchable basis with patient authorization for 12 months   following the date of the study.      All CT exams at this facility use one or more dose reduction techniques   including automatic exposure control, mA/kV adjustment per patient's size, or   iterative reconstruction technique.      WORKSTATION ID: [id number]      FINDINGS:      No focal mass, midline shift or acute intracranial hemorrhage.      Prominent diffuse parenchymal volume loss and advanced chronic small vessel   ischemic change.      A stable small left posterior cranial fossa, petrous ridge meningioma measuring   1.1 cm in maximal dimension.      Paranasal sinuses, mastoid air cells and middle ear cavities grossly   unremarkable. Mild dehiscence of the right lamina papyracea, which may reflect   radiation normal anatomy versus sequela of previous injury.      Impression   IMPRESSION:   1. No acute intracranial abnormalities.   2. Diffuse parenchymal volume loss and advanced chronic small vessel ischemic   change.   3. Stable small left petrous ridge meningioma.      Electronically signed by: Mikal Plane, MD 05/19/2021 6:33 PM EST                  VAS/US Results:   Results from Hospital Encounter encounter on 05/11/21      DUPLEX LOWER EXT VENOUS BILAT      Narrative   [id number]   [phone]   [id number]   Study ID: [id number]      [facility]   [address] [place],   [place]   [address]      Lower Extremity Venous Report      Name: KASUMI, DITULLIO Date: 05/17/2021 09:32 AM   MRN: [id number]                   Patient Location: EHO^[id number]   DOB: 07/07/1950               Age: 57 yrs   Gender: Female                Account #: [id number]   Reason For Study: acute hypoxic respiratory failure. ICD-10   J96.01   Ordering Physician: Vickey Huger   Referring Physician: [name]   Performed By: Miles Costain, RVT      Interpretation Summary   1.No evidence of deep  vein thrombosis in the bilateral lower extremities.   2. In comparison to exam perfomed on 08/22/2019, no significant change is   noted.   ___________________________________________________________________________   QUALITY/PROCEDURE   Complete bilateral venous duplex performed.      HISTORY/SYMPTOMS   Diabetes. COPD. HTN. previous left subclavian DVT.      RIGHT LEG   The right common femoral, femoral, popliteal,  posterior tibial and   peroneal veins were examined with duplex ultrasound.   The deep veins were patent and compressible with no evidence of   intraluminal thrombus. Spontaneous, phasic venous   flow with normal augmentation was noted throughout the right leg.      RIGHT SAPHENOUS VEINS   Right great saphenous vein at the saphenofemoral junction demonstrates   normal compressibility with spontaneous and   phasic venous hemodynamics.      LEFT LEG   The left common femoral, femoral, popliteal, posterior tibial and peroneal   veins were examined with duplex ultrasound.   The deep veins were patent and compressible with no evidence of   intraluminal thrombus. Spontaneous, phasic venous   flow with normal augmentation was noted throughout the left leg.      LEFT LEG SAPHENOUS VEINS   Left great saphenous vein at the saphenofemoral junction demonstrates   normal compressibility with spontaneous and   phasic venous hemodynamics.      Electronically signed byDr. Neta Ehlers, M.D   05/17/2021 12:00 PM            Total clinical care time was 37  minutes of which more than 50% was spent in coordination of care and counseling (time spent with patient/family face to face, physical exam, reviewing laboratory and imaging investigations, speaking with physicians and  nursing staff involved in this patient's care).       Dragon medical dictation system was used for part of this note. Unintentional voice recognition errors may occur      Tia Masker,  MD   [facility]   May 22, 2021    Time: 12:44 PM

## 2021-05-22 NOTE — Progress Notes (Signed)
 SPEECH LANGUAGE PATHOLOGY   BEDSIDE DYSPHAGIA TREATMENT AND DISCHARGE     Patient: [name] [name] (70 y.o. female) 1950-06-17  Room: 5221/5221  Primary Diagnosis: Respiratory failure (HCC) [J96.90]    Procedure(s) (LRB):  BRONCHOSCOPY ABORTED B/O BLOOD IN BRONCHIALS (N/A) 8 Days Post-Op    Isolation:  There are currently no Active Isolations       MDRO: No current active infections  PPE: surgical mask, gloves  Precautions:  universal     Date: 05/22/2021  Start Time:  1218 End Time:  1228    TREATMENT:   Orientation: Pt awake, alert, disoriented to year     Respiratory Status: RA  Treatment: Dysphagia tx completed b/s with tolerance/ safety of IDDSI 7- easy to chew, IDDSI 0- thin.  Pt consumed graham cracker with adequate mastication and took serial straw sips of thin liquids without exhibiting any overt s/sx suggestive of aspiration. She continue to demonstrate clinical tolerance for current diet without any changes noted to respiratory/pulmonary status per chart review. Per RN and spouse (present at bedside) pt tolerating po without any difficulty. Spouse with d/c concerns and questions requesting to speak with case manager. SLP notified CM via secure text page regarding spouse requests.  Pt currently able to tolerate safest, least restrictive diet and meds without difficulty.  Ongoing skilled ST not indicated, S/o  Functional Oral Intake Scale: Level 7: Total oral intake without restrictions     PLAN/ RECOMMENDATIONS:     Diet Recommendations:  continue IDDSI 7- easy to chew, IDDSI 0- thin  Therapy Recommendations: Pt has achieved maximum gains from skilled SLP intervention(s).  SLP will sign off and be avail in the future if needed.      SUBJECTIVE:    Patient Stated: Much better.   Pain Prior to Treatment: no pain reported   Pain Following Treatment: no pain reported     OBJECTIVE:   Consistencies: thin single sips, serial sips, and via straw, regular  Oral Phase: WFL  Pharyngeal Phase: no immediate or delayed  cough, throat clear, or change in vocal quality occurred following all trials    Safety: Following session, pt in bed, lowest position, HOB upright*, 2 side rails up, bed alarm on and tray table, phone, and call bell within reach    EDUCATION:   Education Provided: role of SLP, POC, results of evaluation, and diet recommendations  Individual Educated: patient and family  Comprehension: pt communicated comprehension and family communicated comprehension  Staff Education: Educated Nurse to POC and diet recommendations.     Thank you,  Renaldo Doniphan, M.S. CCC-SLP  Speech Language Pathologist  Ext [phone] or Secure Chat

## 2021-05-22 NOTE — Progress Notes (Signed)
Problem: Falls - Risk of  Goal: *Absence of Falls  Description: Document [name] [name] Fall Risk and appropriate interventions in the flowsheet.  Outcome: Progressing Towards Goal  Note: Fall Risk Interventions:  Mobility Interventions: Bed/chair exit alarm, Strengthening exercises (ROM-active/passive), PT Consult for mobility concerns    Mentation Interventions: Bed/chair exit alarm, Adequate sleep, hydration, pain control    Medication Interventions: Bed/chair exit alarm, Teach patient to arise slowly, Patient to call before getting OOB    Elimination Interventions: Bed/chair exit alarm, Call light in reach, Stay With Me (per policy)              Problem: Gas Exchange - Impaired  Goal: *Absence of hypoxia  Outcome: Progressing Towards Goal

## 2021-05-23 LAB — POCT GLUCOSE
POC Glucose: 228 mg/dL — ABNORMAL HIGH (ref 65–105)
POC Glucose: 172 mg/dL — ABNORMAL HIGH (ref 65–105)
POC Glucose: 194 mg/dL — ABNORMAL HIGH (ref 65–105)
POC Glucose: 262 mg/dL — ABNORMAL HIGH (ref 65–105)

## 2021-05-23 LAB — D-DIMER, QUANTITATIVE: D-Dimer, Quant: 0.66 ug/mL (FEU) — ABNORMAL HIGH (ref 0.01–0.50)

## 2021-05-23 LAB — POTASSIUM
Potassium: 3.3 mEq/L — ABNORMAL LOW (ref 3.5–5.1)
Potassium: 3.3 mEq/L — ABNORMAL LOW (ref 3.5–5.1)

## 2021-05-23 LAB — GLUCOSE, POC
Glucose (POC): 228 mg/dL — ABNORMAL HIGH (ref 65–105)
Glucose (POC): 172 mg/dL — ABNORMAL HIGH (ref 65–105)
Glucose (POC): 194 mg/dL — ABNORMAL HIGH (ref 65–105)
Glucose (POC): 262 mg/dL — ABNORMAL HIGH (ref 65–105)

## 2021-05-23 LAB — D DIMER: D DIMER: 0.66 ug/mL (FEU) — ABNORMAL HIGH (ref 0.01–0.50)

## 2021-05-23 MED ORDER — QUETIAPINE SR 50 MG 24 HR TAB
50 mg | Freq: Every evening | ORAL | Status: AC
Start: 2021-05-23 — End: 2021-05-26
  Administered 2021-05-24 – 2021-05-25 (×3): via ORAL

## 2021-05-23 MED ORDER — PREDNISONE 20 MG TAB
20 mg | ORAL_TABLET | Freq: Every day | ORAL | 0 refills | Status: AC
Start: 2021-05-23 — End: 2021-05-26

## 2021-05-23 MED ORDER — ESCITALOPRAM 10 MG TAB
10 mg | Freq: Every day | ORAL | Status: AC
Start: 2021-05-23 — End: 2021-05-26
  Administered 2021-05-23 – 2021-05-25 (×3): via ORAL

## 2021-05-23 MED ORDER — TIOTROPIUM BROMIDE 2.5 MCG/ACTUATION MIST FOR INHALATION
2.5 mcg/actuation | Freq: Every day | RESPIRATORY_TRACT | 1 refills | Status: AC
Start: 2021-05-23 — End: ?

## 2021-05-23 MED ORDER — POTASSIUM CHLORIDE SR 20 MEQ TAB, PARTICLES/CRYSTALS
20 mEq | ORAL | Status: AC
Start: 2021-05-23 — End: 2021-05-23
  Administered 2021-05-23: 18:00:00 via ORAL

## 2021-05-23 MED ORDER — AMLODIPINE 10 MG TAB
10 mg | ORAL_TABLET | Freq: Every day | ORAL | 1 refills | Status: AC
Start: 2021-05-23 — End: ?

## 2021-05-23 MED FILL — TYLENOL 325 MG TABLET: 325 mg | ORAL | Qty: 2

## 2021-05-23 MED FILL — HEPARIN (PORCINE) 5,000 UNIT/ML IJ SOLN: 5000 unit/mL | INTRAMUSCULAR | Qty: 1

## 2021-05-23 MED FILL — ESCITALOPRAM 10 MG TAB: 10 mg | ORAL | Qty: 1

## 2021-05-23 MED FILL — AMLODIPINE 5 MG TAB: 5 mg | ORAL | Qty: 2

## 2021-05-23 MED FILL — POTASSIUM CHLORIDE SR 20 MEQ TAB, PARTICLES/CRYSTALS: 20 mEq | ORAL | Qty: 2

## 2021-05-23 MED FILL — DOCUSATE SODIUM 50 MG/5 ML ORAL LIQUID: 50 mg/5 mL | ORAL | Qty: 10

## 2021-05-23 MED FILL — POTASSIUM CHLORIDE SR 20 MEQ TAB, PARTICLES/CRYSTALS: 20 mEq | ORAL | Qty: 1

## 2021-05-23 MED FILL — SEROQUEL XR 50 MG TABLET,EXTENDED RELEASE: 50 mg | ORAL | Qty: 3

## 2021-05-23 MED FILL — IPRATROPIUM-ALBUTEROL 2.5 MG-0.5 MG/3 ML NEB SOLUTION: 2.5 mg-0.5 mg/3 ml | RESPIRATORY_TRACT | Qty: 3

## 2021-05-23 MED FILL — CLONIDINE 0.2 MG TAB: 0.2 mg | ORAL | Qty: 1

## 2021-05-23 MED FILL — PREDNISONE 20 MG TAB: 20 mg | ORAL | Qty: 2

## 2021-05-23 MED FILL — PANTOPRAZOLE 40 MG IV SOLR: 40 mg | INTRAVENOUS | Qty: 40

## 2021-05-23 MED FILL — MONTELUKAST 10 MG TAB: 10 mg | ORAL | Qty: 1

## 2021-05-23 NOTE — Progress Notes (Signed)
Spoke with Dr. Sandie Ano about Pt's breathing conditions, and Pt's discharge has been put on hold for now d/t difficulty breathing with the nasal canula on. Pt. Continues to have to be placed on Bypap machine for rescue breathing.   Dr. Sandie Ano is aware of Pt's condition, Husband is at bedside and is aware of hold on discharge and agrees with decision.

## 2021-05-23 NOTE — Progress Notes (Signed)
Progress Notes by Salley Slaughter, MD at 05/23/21 1501                Author: Salley Slaughter, MD  Service: Hospitalist  Author Type: Physician       Filed: 05/23/21 1505  Date of Service: 05/23/21 1501  Status: Signed          Editor: Salley Slaughter, MD (Physician)                          INTERNAL MEDICINE PROGRESS NOTE      Date of note:      May 23, 2021      Patient:               [name] [name], 70 y.o., female   Admit Date:        05/11/2021   Length of Stay:  12 day(s)      Problem List:      Patient Active Problem List        Diagnosis  Code         ?  Severe hypertension  I10     ?  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  PTSD (post-traumatic stress disorder)  F43.10     ?  Severe headache  R51.9     ?  Accelerated hypertension  I10     ?  Acute colitis  K52.9     ?  Acute hyperglycemia  R73.9     ?  Uncontrolled type 2 diabetes mellitus with hyperglycemia (HCC)  E11.65     ?  Spondylosis of lumbar region without myelopathy or radiculopathy  M47.816     ?  Lumbar and sacral osteoarthritis  M47.817     ?  Chronic pain syndrome  G89.4     ?  Sacroiliitis (HCC)  M46.1     ?  Type 2 diabetes mellitus with nephropathy (HCC)  E11.21     ?  Lactic acidosis  E87.20     ?  Leukocytosis  D72.829     ?  Sepsis (HCC)  A41.9     ?  Asthma exacerbation  J45.901     ?  Asthma with acute exacerbation  J45.901     ?  Gastritis  K29.70     ?  Nausea & vomiting  R11.2     ?  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  Cannabinoid hyperemesis syndrome  R11.2, F12.90     ?  Type 2 diabetes mellitus with diabetic neuropathy (HCC)  E11.40     ?  Chest pain  R07.9     ?  Dyspnea  R06.00     ?  Dehydration  E86.0     ?  COPD with acute exacerbation (HCC)  J44.1     ?  Acute-on-chronic kidney injury (HCC)  N17.9, N18.9     ?  Hyperkalemia  E87.5     ?  Obtunded  R40.1     ?  Acute renal failure (ARF) (HCC)  N17.9     ?  Septic shock (HCC)  A41.9, R65.21     ?  Metabolic encephalopathy   G93.41     ?  SIRS (systemic inflammatory response syndrome) (HCC)  R65.10     ?  Abdominal pain  R10.9     ?  Headache  R51.9     ?  Chronic respiratory failure with hypoxia (HCC)  J96.11     ?  COPD exacerbation (HCC)  J44.1     ?  Acute hypoxemic respiratory failure (HCC)  J96.01     ?  UTI (urinary tract infection)  N39.0     ?  Acute CHF (congestive heart failure) (HCC)  I50.9         ?  Respiratory failure (HCC)  J96.90             Subjective + interval history        Was feeling ok this morning,and was eager to be discharged. After the discharge orders were placed, patient got "Panic attack" - got short of breath and had to be placed on BiPAP. So discharge was held        Assessment :         1.  Acute on chronic hypoxic hypercapnic respiratory failure requiring intubation  on 05/11/21 -> extubated on 05/17/21   2.  Community-acquired pneumonia with left mucous plugging/atelectasis, s/p bronchoscopy with 05/16/21 -mild secretions, irritation left airways, no mucous plug, dynamic collapse Sup segt LLL -> cultures negative, cytology negative for  malignant cells   3.  Blood cultures 1 out of 2 positive for Corynebacterium, likely procurement contamination   4.  Hemoptysis secondary to pneumonia   5.  Acute COPD exacerbation - on 3L supplemental oxygen at baseline   6.  Bullous emphysema   7.  Hyperkalemia   8.  Leukocytosis,    9.  Bullous emphysema   10.  Hypertension   11.  Dyslipidemia   12.  Type 2 diabetes mellitus   13.  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  Chronic pain   15.  2 cm left lower lobe lung nodule-will need outpatient follow-up   16.  UDS positive for marijuana   17.  Diabetic neuropathy   18.  Physical deconditioning. Bedridden at baseline         Plan :         Extubated on 12/5   Continue supplemental oxygen to maintain SPO2 > 88% - BiPAP PRN and nightly   ECHO shows normal EF 61%, normal diastolic function    Reactive leukocytosis is improving   Continue nebulized bronchodilator every 6 hours, oral prednisone 40 mg daily for 5 days   Continue Singulair 10 mg daily.  Completed IV zosyn -  bronch cultures are negative so far         I think patient will need BiPAP /CPAP at home to prevent hospitalization . Will hold off discharge and discuss with pulmonology       DVT ppx : Subcutaneous heparin ( left upper and bilateral lower extremity venous PVL was negative for DVT ) - eliquis was held for bronchoscopy and due to mild hemoptysis   Completed 3 months of eliquis.  GI prophylaxis-Protonix        Discussed with patient's husband at bedside   - Code status: full code           Objective:           Visit Vitals      BP  (!) 144/107 (BP Patient Position: Supine)     Pulse  85     Temp  97.2 ??F (36.2 ??C)     Resp  15     Ht   (1.702 m)  Wt  94.5 kg (208 lb 5.4 oz)     SpO2  99%        BMI  32.63 kg/m??                       Intake/Output Summary (Last 24 hours) at 05/23/2021 1501   Last data filed at 05/22/2021 1846     Gross per 24 hour        Intake  --        Output  1050 ml        Net  -1050 ml             Physical Exam:        GEN -alert, anxious, in mild distress   HEENT - mucous membranes moist   Neck - supple, no JVD   Cardiac - RRR, S1, S2, no murmurs   Chest/Lungs - minimal expiratory wheezing   Abdomen - soft, obese, non tender   Extremities - no clubbing/ cyanosis/ edema   Neuro -follows commands, moves all 4 extremities, very weak and deconditioned.   Skin - no rashes or lesions        Current medications:           Current Facility-Administered Medications:    ?  albuterol-ipratropium (DUO-NEB) 2.5 MG-0.5 MG/3 ML, 3 mL, Nebulization, Q6H PRN, Shea Stakes, MD, 3 mL at 05/23/21 1232   ?  ondansetron (ZOFRAN) injection 4 mg, 4 mg, IntraVENous, Q6H PRN, Samayah Novinger S, MD   ?  predniSONE (DELTASONE) tablet 40 mg, 40 mg, Oral, DAILY WITH BREAKFAST, Shea Stakes, MD, 40 mg at 05/23/21 6659   ?  glucagon  (GLUCAGEN) injection 1 mg, 1 mg, IntraMUSCular, PRN, Chantele Corado S, MD   ?  insulin glargine (LANTUS) injection 1-100 Units, 1-100 Units, SubCUTAneous, QHS, Raley Novicki S, MD, 14 Units at 05/22/21 2154   ?  insulin lispro (HUMALOG) injection 1-100 Units, 1-100 Units, SubCUTAneous, AC&HS, Markeem Noreen S, MD, 12 Units at 05/23/21 1445   ?  insulin lispro (HUMALOG) injection 1-100 Units, 1-100 Units, SubCUTAneous, PRN, Jenina Moening S, MD   ?  acetaminophen (TYLENOL) tablet 650 mg, 650 mg, Oral, Q6H PRN, Isidore Margraf S, MD, 650 mg at 05/22/21 2152   ?  fluticasone-vilanterol (BREO ELLIPTA) 221mcg-25mcg/puff, 1 Puff, Inhalation, DAILY, Zoe Lan, MD, 1 Puff at 05/23/21 0900   ?  tiotropium bromide (SPIRIVA RESPIMAT) 2.5 mcg Salvadore Farber, 2 [place], Inhalation, DAILY, Zoe Lan, MD, 2 Puff at 05/23/21 0901   ?  cloNIDine HCL (CATAPRES) tablet 0.2 mg, 0.2 mg, Oral, BID, Zoe Lan, MD, 0.2 mg at 05/23/21 9357   ?  amLODIPine (NORVASC) tablet 10 mg, 10 mg, Oral, DAILY, Reid, Hailey, PA-C, 10 mg at 05/23/21 0177   ?  heparin (porcine) injection 5,000 Units, 5,000 Units, SubCUTAneous, Q8H, Imad, Melhem, MD, 5,000 Units at 05/23/21 1400   ?  hydrALAZINE (APRESOLINE) 20 mg/mL injection 20 mg, 20 mg, IntraVENous, Q6H PRN, Lottie Dawson, NP, 20 mg at 05/19/21 0443   ?  pantoprazole (PROTONIX) 40 mg in 0.9% sodium chloride 10 mL injection, 40 mg, IntraVENous, DAILY, Gar Gibbon C, NP, 40 mg at 05/22/21  1011   ?  dextrose (D50W) injection syrg 5-25 g, 10-50 mL, IntraVENous, PRN, Lottie Dawson, NP   ?  docusate (COLACE) 50 mg/5 mL oral liquid 100 mg, 100 mg, Oral, BID, Imad, Melhem, MD, 100 mg at 05/22/21 2152   ?  montelukast (SINGULAIR) tablet 10 mg, 10 mg, Oral, QHS, Lauralee Evener, PA-C, 10 mg at 05/22/21 2152   ?  white petrolatum-mineral oiL (LACRILUBE S.O.P.) ointment 1 Each, 1 Each, Both Eyes, DAILY, Erling Conte, MD, 1 Each at 05/23/21 0900        Labs:          Recent Results  (from the past 24 hour(s))     GLUCOSE, POC          Collection Time: 05/22/21  5:13 PM         Result  Value  Ref Range            Glucose (POC)  234 (H)  65 - 105 mg/dL       GLUCOSE, POC          Collection Time: 05/22/21  9:30 PM         Result  Value  Ref Range            Glucose (POC)  228 (H)  65 - 105 mg/dL       POTASSIUM          Collection Time: 05/23/21  6:53 AM         Result  Value  Ref Range            Potassium  3.3 (L)  3.5 - 5.1 mEq/L       D DIMER          Collection Time: 05/23/21  6:53 AM         Result  Value  Ref Range            D DIMER  0.66 (H)  0.01 - 0.50 ug/mL (FEU)       GLUCOSE, POC          Collection Time: 05/23/21  9:20 AM         Result  Value  Ref Range            Glucose (POC)  172 (H)  65 - 105 mg/dL       GLUCOSE, POC          Collection Time: 05/23/21 11:35 AM         Result  Value  Ref Range            Glucose (POC)  194 (H)  65 - 105 mg/dL           XR Results:   Results from Hospital Encounter encounter on 05/11/21      XR CHEST SNGL V      Narrative   Chest      Indication: SOB   Comparison: 05/17/2021      Findings:      AP portable upright view of the chest demonstrates that the cardiac silhouette   is not  enlarged. Stable left upper lobe bulla. Interval worsening of right   lower lobe airspace disease. Stable left mid and basilar airspace disease.      Impression   Impression:      Worsening right lower lobe airspace disease. Stable left lung airspace disease      Electronically signed by: Jodi Mourning, MD 05/18/2021 9:42 AM EST         CT Results:   Results from Hospital Encounter encounter on 05/11/21      CT HEAD WO CONT      Narrative   Examination: CT head without contrast.      INDICATION:  AMS and headache      COMPARISON: 2020      TECHNIQUE: CT head without contrast. DICOM format image data is available to   non-affiliated external healthcare facilities or entities on a secure, media   free, reciprocally searchable basis with patient authorization for 12 months    following the date of the study.      All CT exams at this facility use one or more dose reduction techniques   including automatic exposure control, mA/kV adjustment per patient's size, or   iterative reconstruction technique.      WORKSTATION ID: [id number]      FINDINGS:      No focal mass, midline shift or acute intracranial hemorrhage.      Prominent diffuse parenchymal volume loss and advanced chronic small vessel   ischemic change.      A stable small left posterior cranial fossa, petrous ridge meningioma measuring   1.1 cm in maximal dimension.      Paranasal sinuses, mastoid air cells and middle ear cavities grossly   unremarkable. Mild dehiscence of the right lamina papyracea, which may reflect   radiation normal anatomy versus sequela of previous injury.      Impression   IMPRESSION:   1. No acute intracranial abnormalities.   2. Diffuse parenchymal volume loss and advanced chronic small vessel ischemic   change.   3. Stable small left petrous ridge meningioma.      Electronically signed by: Mikal Plane, MD 05/19/2021 6:33 PM EST         MRI Results:   Results from Orders Only encounter on 10/17/19      MRI LUMB SPINE W CONT         Nuclear Medicine Results:   No results found for this or any previous visit.         [place] Results:   Results from Hospital Encounter encounter on 12/24/17      [place] RUQ      Narrative   Examination: Ultrasound right upper quadrant.      INDICATION: Pain.      FINDINGS:      The liver appears grossly unremarkable. No focal hepatic lesions definitely   identified.      The gallbladder appears unremarkable. No gallbladder wall thickening. No   sonographic Murphy's sign. CBD within normal limits roughly measuring 0.3 cm.      Right kidney appears unremarkable and is normal in size. A 4 mm nonobstructing   renal calculus. Tiny cyst on previous CT dated 08/27/2016, not well visualized on   the current exam.      Proximal aorta and IVC appear unremarkable.      Impression    IMPRESSION:   1. No acute abnormalities.   2. No gallbladder wall thickening, gallstones or evidence of acute   cholecystitis.   3. Nonobstructing right renal calculus.         IR Results:   No results found for this or any previous visit.         VAS/US Results:   Results from Hospital Encounter encounter on 05/11/21      DUPLEX LOWER EXT VENOUS BILAT      Narrative   [id number]   [id number]   [id number]   Study ID: [id number]      [facility]   [address] [place],   [place]   [id number]  Lower Extremity Venous Report      Name: NIAOMI, CARTAYA Date: 05/17/2021 09:32 AM   MRN: [id number]                   Patient Location: [id number]   DOB: 29-Nov-1950               Age: 76 yrs   Gender: Female                Account #: [id number]   Reason For Study: acute hypoxic respiratory failure. ICD-10   J96.01   Ordering Physician: Vickey Huger   Referring Physician: [name]   Performed By: Miles Costain, RVT      Interpretation Summary   1.No evidence of deep vein thrombosis in the bilateral lower extremities.   2. In comparison to exam perfomed on 08/22/2019, no significant change is   noted.   ___________________________________________________________________________   QUALITY/PROCEDURE   Complete bilateral venous duplex performed.      HISTORY/SYMPTOMS   Diabetes. COPD. HTN. previous left subclavian DVT.      RIGHT LEG   The right common femoral, femoral, popliteal, posterior tibial and   peroneal veins were examined with duplex ultrasound.   The deep veins were patent and compressible with no evidence of   intraluminal thrombus. Spontaneous, phasic venous   flow with normal augmentation was noted throughout the right leg.      RIGHT SAPHENOUS VEINS   Right great saphenous vein at the saphenofemoral junction demonstrates   normal compressibility with spontaneous and   phasic venous hemodynamics.      LEFT LEG   The left common femoral, femoral,  popliteal, posterior tibial and peroneal   veins were examined with duplex ultrasound.   The deep veins were patent and compressible with no evidence of   intraluminal thrombus. Spontaneous, phasic venous   flow with normal augmentation was noted throughout the left leg.      LEFT LEG SAPHENOUS VEINS   Left great saphenous vein at the saphenofemoral junction demonstrates   normal compressibility with spontaneous and   phasic venous hemodynamics.      Electronically signed byDr. Neta Ehlers, M.D   05/17/2021 12:00 PM            Total clinical care time was 36  minutes of which more than 50% was spent in coordination of care and counseling (time spent with patient/family face to face, physical exam, reviewing laboratory and imaging investigations, speaking with physicians and  nursing staff involved in this patient's care).       Dragon medical dictation system was used for part of this note. Unintentional voice recognition errors may occur      Tia Masker,  MD   [facility]   May 23, 2021   Time: 3:01 PM

## 2021-05-23 NOTE — Progress Notes (Signed)
BiPAP Progress Note    Patient currently is On BiPAP  Patient tolerated BiPAP for the PM shift    BiPAP QHS    BiPAP settings are:  Bio-Med ID #: [id number]  IPAP:  18 cm H2O   EPAP: 10 cm H2O  Rate:   10  FiO2:   30 %     BiPAP will be removed from room after 48 hours of non-use

## 2021-05-23 NOTE — Progress Notes (Signed)
Patient alert and oriented. However, she says she is going home tomorrow and does not want the tele plus the IV access. Turning attempted, she is leaning on her weak side, the right side. Pillow arranged on that side to offer relief.

## 2021-05-23 NOTE — Progress Notes (Signed)
Pt husband at bedside, MD has seen pt, ok for discharge today. Awaiting for transport to give ETA.

## 2021-05-23 NOTE — Progress Notes (Signed)
Problem: Pressure Injury - Risk of  Goal: *Prevention of pressure injury  Description: Document Braden Scale and appropriate interventions in the flowsheet.  Outcome: Progressing Towards Goal  Note: Pressure Injury Interventions:  Sensory Interventions: Assess changes in LOC    Moisture Interventions: Absorbent underpads    Activity Interventions: Pressure redistribution bed/mattress(bed type)    Mobility Interventions: HOB 30 degrees or less    Nutrition Interventions: Document food/fluid/supplement intake    Friction and Shear Interventions: HOB 30 degrees or less                Problem: Falls - Risk of  Goal: *Absence of Falls  Description: Document Schmid Fall Risk and appropriate interventions in the flowsheet.  Outcome: Progressing Towards Goal  Note: Fall Risk Interventions:  Mobility Interventions: Bed/chair exit alarm    Mentation Interventions: Bed/chair exit alarm    Medication Interventions: Bed/chair exit alarm, Patient to call before getting OOB    Elimination Interventions: Bed/chair exit alarm

## 2021-05-23 NOTE — Progress Notes (Signed)
CANCELLED: Medical transport 05/23/2021 @ 1600.

## 2021-05-24 LAB — POCT GLUCOSE
POC Glucose: 263 mg/dL — ABNORMAL HIGH (ref 65–105)
POC Glucose: 136 mg/dL — ABNORMAL HIGH (ref 65–105)
POC Glucose: 212 mg/dL — ABNORMAL HIGH (ref 65–105)
POC Glucose: 234 mg/dL — ABNORMAL HIGH (ref 65–105)

## 2021-05-24 LAB — GLUCOSE, POC
Glucose (POC): 263 mg/dL — ABNORMAL HIGH (ref 65–105)
Glucose (POC): 136 mg/dL — ABNORMAL HIGH (ref 65–105)
Glucose (POC): 212 mg/dL — ABNORMAL HIGH (ref 65–105)
Glucose (POC): 234 mg/dL — ABNORMAL HIGH (ref 65–105)

## 2021-05-24 MED FILL — TYLENOL 325 MG TABLET: 325 mg | ORAL | Qty: 2

## 2021-05-24 MED FILL — AMLODIPINE 5 MG TAB: 5 mg | ORAL | Qty: 2

## 2021-05-24 MED FILL — QUETIAPINE SR 50 MG 24 HR TAB: 50 mg | ORAL | Qty: 3

## 2021-05-24 MED FILL — CLONIDINE 0.2 MG TAB: 0.2 mg | ORAL | Qty: 1

## 2021-05-24 MED FILL — HEPARIN (PORCINE) 5,000 UNIT/ML IJ SOLN: 5000 unit/mL | INTRAMUSCULAR | Qty: 1

## 2021-05-24 MED FILL — PREDNISONE 20 MG TAB: 20 mg | ORAL | Qty: 2

## 2021-05-24 MED FILL — MONTELUKAST 10 MG TAB: 10 mg | ORAL | Qty: 1

## 2021-05-24 MED FILL — PANTOPRAZOLE 40 MG IV SOLR: 40 mg | INTRAVENOUS | Qty: 40

## 2021-05-24 MED FILL — ESCITALOPRAM 10 MG TAB: 10 mg | ORAL | Qty: 1

## 2021-05-24 MED FILL — DOCUSATE SODIUM 50 MG/5 ML ORAL LIQUID: 50 mg/5 mL | ORAL | Qty: 10

## 2021-05-24 MED FILL — IPRATROPIUM-ALBUTEROL 2.5 MG-0.5 MG/3 ML NEB SOLUTION: 2.5 mg-0.5 mg/3 ml | RESPIRATORY_TRACT | Qty: 3

## 2021-05-24 NOTE — Progress Notes (Signed)
Progress Notes by Salley Slaughter, MD at 05/24/21 8119                Author: Salley Slaughter, MD  Service: Hospitalist  Author Type: Physician       Filed: 05/24/21 1310  Date of Service: 05/24/21 0759  Status: Signed          Editor: Salley Slaughter, MD (Physician)                          INTERNAL MEDICINE PROGRESS NOTE      Date of note:      May 24, 2021      Patient:               [name] [name], 70 y.o., female   Admit Date:        05/11/2021   Length of Stay:  13 day(s)      Problem List:      Patient Active Problem List        Diagnosis  Code         ?  Severe hypertension  I10     ?  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  PTSD (post-traumatic stress disorder)  F43.10     ?  Severe headache  R51.9     ?  Accelerated hypertension  I10     ?  Acute colitis  K52.9     ?  Acute hyperglycemia  R73.9     ?  Uncontrolled type 2 diabetes mellitus with hyperglycemia (HCC)  E11.65     ?  Spondylosis of lumbar region without myelopathy or radiculopathy  M47.816     ?  Lumbar and sacral osteoarthritis  M47.817     ?  Chronic pain syndrome  G89.4     ?  Sacroiliitis (HCC)  M46.1     ?  Type 2 diabetes mellitus with nephropathy (HCC)  E11.21     ?  Lactic acidosis  E87.20     ?  Leukocytosis  D72.829     ?  Sepsis (HCC)  A41.9     ?  Asthma exacerbation  J45.901     ?  Asthma with acute exacerbation  J45.901     ?  Gastritis  K29.70     ?  Nausea & vomiting  R11.2     ?  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  Cannabinoid hyperemesis syndrome  R11.2, F12.90     ?  Type 2 diabetes mellitus with diabetic neuropathy (HCC)  E11.40     ?  Chest pain  R07.9     ?  Dyspnea  R06.00     ?  Dehydration  E86.0     ?  COPD with acute exacerbation (HCC)  J44.1     ?  Acute-on-chronic kidney injury (HCC)  N17.9, N18.9     ?  Hyperkalemia  E87.5     ?  Obtunded  R40.1     ?  Acute renal failure (ARF) (HCC)  N17.9     ?  Septic shock (HCC)  A41.9, R65.21     ?  Metabolic encephalopathy   G93.41     ?  SIRS (systemic inflammatory response syndrome) (HCC)  R65.10     ?  Abdominal pain  R10.9     ?  Headache  R51.9     ?  Chronic respiratory failure with hypoxia (HCC)  J96.11     ?  COPD exacerbation (HCC)  J44.1     ?  Acute hypoxemic respiratory failure (HCC)  J96.01     ?  UTI (urinary tract infection)  N39.0     ?  Acute CHF (congestive heart failure) (HCC)  I50.9         ?  Respiratory failure (HCC)  J96.90             Subjective + interval history        Discharge cancelled yesterday as she got very short of breath and was placed on BiPAP.   Doing fairly well today. On 3 L NC        Assessment :         1.  Acute on chronic hypoxic hypercapnic respiratory failure requiring intubation  on 05/11/21 -> extubated on 05/17/21   2.  Community-acquired pneumonia with left mucous plugging/atelectasis, s/p bronchoscopy with 05/16/21 -mild secretions, irritation left airways, no mucous plug, dynamic collapse Sup segt LLL -> cultures negative, cytology negative for  malignant cells   3.  Blood cultures 1 out of 2 positive for Corynebacterium, likely procurement contamination   4.  Hemoptysis secondary to pneumonia   5.  Acute COPD exacerbation - on 3L supplemental oxygen at baseline   6.  Bullous emphysema   7.  Hyperkalemia   8.  Leukocytosis,    9.  Bullous emphysema   10.  Hypertension   11.  Dyslipidemia   12.  Type 2 diabetes mellitus   13.  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  Chronic pain   15.  2 cm left lower lobe lung nodule-will need outpatient follow-up   16.  UDS positive for marijuana   17.  Diabetic neuropathy   18.  Physical deconditioning. Bedridden at baseline         Plan :         Patient was admitted on 05/11/21 for acute hypoxic hypercapnic respiratory failure due to COPD exacerbation   She was intubated and admitted to ICU   She was started on nebulized bronchodilator, IV steroid. She was started  broad-spectrum IV antibiotics.   She had prolonged intubation.   Serial chest x-rays showed  right lower lobe airspace disease, stable left lung airspace disease.    She underwent bronchoscopy on 05/16/2021.  Findings mentioned above.   ECHO showed EF 61%, normal diastolic function      Cultures remained negative. She completed 7 days of IV Zosyn      She was eventually extubated on 05/17/21   She has been requiring BiPAP on and off and at night time.   Discussed with pulmonology that she might need BiPAP/trilogy at home for symptom management and to decrease the risk of hospitalization   She is very deconditioned and weak but does not want to go to SNF. She wants to go home with home health       DVT ppx : Subcutaneous heparin ( left upper and bilateral lower extremity venous PVL was negative for DVT ) - eliquis was held for bronchoscopy and due to mild hemoptysis   Patient has Completed 3 months of eliquis.  GI prophylaxis-Protonix        - Code status: full code   Patient can be discharged home with home health once BiPAP is arranged. Apnea link test was  ordered by pulmonology                Objective:           Visit Vitals      BP  (!) 139/93     Pulse  68     Temp  (!) 96.6 ??F (35.9 ??C)     Resp  14     Ht  5\' 7"  (1.702 m)     Wt  94.5 kg (208 lb 5.4 oz)     SpO2  98%        BMI  32.63 kg/m??                 Intake/Output Summary (Last 24 hours) at 05/24/2021 0800   Last data filed at 05/24/2021 0700     Gross per 24 hour        Intake  240 ml        Output  1000 ml        Net  -760 ml             Physical Exam:        GEN -alert, anxious, not in distress   HEENT - mucous membranes moist   Neck - supple, no JVD   Cardiac - RRR, S1, S2, no murmurs   Chest/Lungs - minimal expiratory wheezing, shallow breathing   Abdomen - soft, obese, non tender   Extremities - no clubbing/ cyanosis/ edema   Neuro -follows commands, moves all 4 extremities, very weak and deconditioned.   Skin - no rashes or lesions        Current  medications:           Current Facility-Administered Medications:    ?  escitalopram oxalate (LEXAPRO) tablet 10 mg, 10 mg, Oral, DAILY, Rasheka Denard S, MD, 10 mg at 05/23/21 1515   ?  QUEtiapine SR (SEROquel XR) tablet 150 mg, 150 mg, Oral, QHS, Isabella Roemmich S, MD, 150 mg at 05/23/21 2118   ?  albuterol-ipratropium (DUO-NEB) 2.5 MG-0.5 MG/3 ML, 3 mL, Nebulization, Q6H PRN, 2119, MD, 3 mL at 05/23/21 1232   ?  ondansetron (ZOFRAN) injection 4 mg, 4 mg, IntraVENous, Q6H PRN, Madgeline Rayo S, MD   ?  predniSONE (DELTASONE) tablet 40 mg, 40 mg, Oral, DAILY WITH BREAKFAST, 1233, MD, 40 mg at 05/23/21 14/11/22   ?  glucagon (GLUCAGEN) injection 1 mg, 1 mg, IntraMUSCular, PRN, Robbie Nangle S, MD   ?  insulin glargine (LANTUS) injection 1-100 Units, 1-100 Units, SubCUTAneous, QHS, Codie Hainer S, MD, 14 Units at 05/23/21 2117   ?  insulin lispro (HUMALOG) injection 1-100 Units, 1-100 Units, SubCUTAneous, AC&HS, Meagon Duskin S, MD, 2 Units at 05/23/21 1837   ?  insulin lispro (HUMALOG) injection 1-100 Units, 1-100 Units, SubCUTAneous, PRN, Shyheim Tanney S, MD   ?  acetaminophen (TYLENOL) tablet 650 mg, 650 mg, Oral, Q6H PRN, Sarra Rachels S, MD, 650 mg at 05/23/21 2119   ?  fluticasone-vilanterol (BREO ELLIPTA) 211mcg-25mcg/puff, 1 Puff, Inhalation, DAILY, 80m, MD, 1 Puff at 05/23/21 0900   ?  tiotropium bromide (SPIRIVA RESPIMAT) 2.5 mcg 14/11/22, 2 [place], Inhalation, DAILY, [place], MD, 2 Puff at 05/23/21 0901   ?  cloNIDine HCL (CATAPRES) tablet 0.2 mg, 0.2 mg, Oral, BID, 14/11/22, MD, 0.2 mg at 05/23/21 2118   ?  amLODIPine (NORVASC) tablet 10 mg, 10 mg, Oral, DAILY, Reid, Hailey, PA-C, 10 mg at  05/23/21 0942   ?  heparin (porcine) injection 5,000 Units, 5,000 Units, SubCUTAneous, Q8H, Imad, Melhem, MD, 5,000 Units at 05/24/21 0513   ?  hydrALAZINE (APRESOLINE) 20 mg/mL injection 20 mg, 20 mg, IntraVENous, Q6H PRN, Lottie Dawson, NP, 20 mg at 05/19/21  0443   ?  pantoprazole (PROTONIX) 40 mg in 0.9% sodium chloride 10 mL injection, 40 mg, IntraVENous, DAILY, Gar Gibbon C, NP, 40 mg at 05/22/21  1011   ?  dextrose (D50W) injection syrg 5-25 g, 10-50 mL, IntraVENous, PRN, Lottie Dawson, NP   ?  docusate (COLACE) 50 mg/5 mL oral liquid 100 mg, 100 mg, Oral, BID, Imad, Melhem, MD, 100 mg at 05/23/21 2118   ?  montelukast (SINGULAIR) tablet 10 mg, 10 mg, Oral, QHS, Reid, Hailey, PA-C, 10 mg at 05/23/21 2118   ?  white petrolatum-mineral oiL (LACRILUBE S.O.P.) ointment 1 Each, 1 Each, Both Eyes, DAILY, Erling Conte, MD, 1 Each at 05/23/21 0900        Labs:          Recent Results (from the past 24 hour(s))     GLUCOSE, POC          Collection Time: 05/23/21  9:20 AM         Result  Value  Ref Range            Glucose (POC)  172 (H)  65 - 105 mg/dL       GLUCOSE, POC          Collection Time: 05/23/21 11:35 AM         Result  Value  Ref Range            Glucose (POC)  194 (H)  65 - 105 mg/dL       GLUCOSE, POC          Collection Time: 05/23/21  5:15 PM         Result  Value  Ref Range            Glucose (POC)  262 (H)  65 - 105 mg/dL       GLUCOSE, POC          Collection Time: 05/23/21  9:08 PM         Result  Value  Ref Range            Glucose (POC)  263 (H)  65 - 105 mg/dL       GLUCOSE, POC          Collection Time: 05/24/21  7:37 AM         Result  Value  Ref Range            Glucose (POC)  136 (H)  65 - 105 mg/dL           XR Results:   Results from Hospital Encounter encounter on 05/11/21      XR CHEST SNGL V      Narrative   Chest      Indication: SOB   Comparison: 05/17/2021      Findings:      AP portable upright view of the chest demonstrates that the cardiac silhouette   is not  enlarged. Stable left upper lobe bulla. Interval worsening of right   lower lobe airspace disease. Stable left mid and basilar airspace disease.      Impression   Impression:      Worsening right lower lobe airspace disease.  Stable left lung airspace disease       Electronically signed by: Jodi Mourning, MD 05/18/2021 9:42 AM EST         CT Results:   Results from Hospital Encounter encounter on 05/11/21      CT HEAD WO CONT      Narrative   Examination: CT head without contrast.      INDICATION: AMS and headache      COMPARISON: 2020      TECHNIQUE: CT head without contrast. DICOM format image data is available to   non-affiliated external healthcare facilities or entities on a secure, media   free, reciprocally searchable basis with patient authorization for 12 months   following the date of the study.      All CT exams at this facility use one or more dose reduction techniques   including automatic exposure control, mA/kV adjustment per patient's size, or   iterative reconstruction technique.      WORKSTATION ID: [id number]      FINDINGS:      No focal mass, midline shift or acute intracranial hemorrhage.      Prominent diffuse parenchymal volume loss and advanced chronic small vessel   ischemic change.      A stable small left posterior cranial fossa, petrous ridge meningioma measuring   1.1 cm in maximal dimension.      Paranasal sinuses, mastoid air cells and middle ear cavities grossly   unremarkable. Mild dehiscence of the right lamina papyracea, which may reflect   radiation normal anatomy versus sequela of previous injury.      Impression   IMPRESSION:   1. No acute intracranial abnormalities.   2. Diffuse parenchymal volume loss and advanced chronic small vessel ischemic   change.   3. Stable small left petrous ridge meningioma.      Electronically signed by: Mikal Plane, MD 05/19/2021 6:33 PM EST                  VAS/US Results:   Results from Hospital Encounter encounter on 05/11/21      DUPLEX LOWER EXT VENOUS BILAT      Narrative   [id number]   [phone]   [id number]   Study ID: [id number]      [facility]   [address] [place],   [place]   [address]      Lower Extremity Venous Report      Name: [name] [name], [name] [name]  Date: 05/17/2021 09:32 AM   MRN: [id number]                   Patient Location: [id number]   DOB: February 28, 1951               Age: 24 yrs   Gender: Female                Account #: [id number]   Reason For Study: acute hypoxic respiratory failure. ICD-10   J96.01   Ordering Physician: Vickey Huger   Referring Physician: [name]   Performed By: Miles Costain, RVT      Interpretation Summary   1.No evidence of deep vein thrombosis in the bilateral lower extremities.   2. In comparison to exam perfomed on 08/22/2019, no significant change is   noted.   ___________________________________________________________________________   QUALITY/PROCEDURE   Complete bilateral venous duplex performed.  HISTORY/SYMPTOMS   Diabetes. COPD. HTN. previous left subclavian DVT.      RIGHT LEG   The right common femoral, femoral, popliteal, posterior tibial and   peroneal veins were examined with duplex ultrasound.   The deep veins were patent and compressible with no evidence of   intraluminal thrombus. Spontaneous, phasic venous   flow with normal augmentation was noted throughout the right leg.      RIGHT SAPHENOUS VEINS   Right great saphenous vein at the saphenofemoral junction demonstrates   normal compressibility with spontaneous and   phasic venous hemodynamics.      LEFT LEG   The left common femoral, femoral, popliteal, posterior tibial and peroneal   veins were examined with duplex ultrasound.   The deep veins were patent and compressible with no evidence of   intraluminal thrombus. Spontaneous, phasic venous   flow with normal augmentation was noted throughout the left leg.      LEFT LEG SAPHENOUS VEINS   Left great saphenous vein at the saphenofemoral junction demonstrates   normal compressibility with spontaneous and   phasic venous hemodynamics.      Electronically signed byDr. Neta Ehlers, M.D   05/17/2021 12:00 PM            Total clinical care time was 40  minutes of which more than 50% was spent in  coordination of care and counseling (time spent with patient/family face to face, physical exam, reviewing laboratory and imaging investigations, speaking with physicians and  nursing staff involved in this patient's care).       Dragon medical dictation system was used for part of this note. Unintentional voice recognition errors may occur      Tia Masker,  MD   [facility]   May 24, 2021   Time: 8:00 AM

## 2021-05-24 NOTE — Progress Notes (Signed)
Notified by Dr. Sandie Ano for the need of BiPAP on discharge  I've reached out to the reps. Will need inpatient overnight oximetry if she is to get approved.  Have placed an order for apnea link.      Shea Stakes, MD, FACP  Pulmonary & Critical Care  [facility]

## 2021-05-24 NOTE — Progress Notes (Signed)
 OCCUPATIONAL THERAPY TREATMENT            Patient: [name] [name] (70 y.o. female)  Room: 5221/5221    Primary Diagnosis: Respiratory failure (HCC) [J96.90]   Procedure(s) (LRB):  BRONCHOSCOPY ABORTED B/O BLOOD IN BRONCHIALS (N/A) 10 Days Post-Op  Date of Admission: 05/11/2021   Length of Stay:  13 day(s)  Insurance: Payor: HU[employer] MEDICARE / Plan: [facility] HU[employer] MEDICARE HMO / Product Type: Managed Care Medicare /      Date: 05/24/2021  In time:  1414        Out time:  1422    Isolation:  There are currently no Active Isolations       MDRO: No active infections    Precautions: falls, watch O2 SATs,  pt is bed-bound    Ordered weight bearing status: None    Current diet order: ADULT DIET Easy to Chew; 5 carb choices (75 gm/meal)  ADULT ORAL NUTRITION SUPPLEMENT Lunch; Diabetic Supplement    ASSESSMENT:    Based on the objective data described below, the patient presents with       -  Physical therapy stopped working on transfers per patient and her spouse. Pt's goals are to decrease CG demands by increasing independence at bed level ADLs including self feeding, grooming, UB bathing and dressing,  bed mobility, etc.         PLAN: Continue OT POC    Recommendations:  -  Recommend continued skilled occupational therapy intervention to address above impairments.  Recommend out of bed activity to counteract ill effects of bedrest, with assistance from staff as needed.      Discharge Recommendations: Home health OT.  Further Equipment Recommendations for Discharge: to be determined     Education/ communication:       Barriers to Learning/Limitations:  yes;  physical  Education provided to: patient on (+) role of OT, (+) OT plan of care, (+) the importance of maintaining UE muscle strength and activity tolerance while hospitalized to prevent a decline in function, (+) staff assistance with mobility, (+) change positions frequently, ADLs, (+) breathing exercises, (+) energy conservation techniques, (+) home exercise  program  Educational Handouts issued: none this session  Patient / Family readiness to learn indicated by: verbalized understanding, asking questions, needs reinforcement    SUBJECTIVE:       Patient said,  I just want to go home.  Everything I need is there.  NOT HERE!.    OBJECTIVE DATA SUMMARY:   Orders, labs, occupational therapy and chart reviewed on [name] [name]. Communicated with nursing staff. Patient cleared to participate in Occupational Therapy treatment.    Patient found: Bed, (+) bed/chair exit alarm, (+) oxygen    Pain assessment: none reported    Cognitive:   - Patient is awake, alert, pleasant and cooperative.  Patient is oriented to person, place, month and year.  Patient demonstrates appropriate eye contact, appropriate casual conversation and is attentive.    Activities of Daily Living:  - The patient participated by asking questions regarding OT scope of practice and her POC.  Provided education during this skilled OT session.  Offered Bed level or EOB ADL retraining with AE/Adaptive techniques today - pt declined.        Therapeutic Exercises:   - Offered Therapeutic exercise training to increase BUE and core strength needed to increase ADL skills.  Pt declined today.        Vitals:   Vitals stable throughout session on 3.5L  of O2 via NC    Final Location:  bed, bed alarm and nasal cannula in place    Mliss MARLA Gelineau, OTA  May 24, 2021

## 2021-05-24 NOTE — Progress Notes (Signed)
Problem: Pressure Injury - Risk of  Goal: *Prevention of pressure injury  Description: Document Braden Scale and appropriate interventions in the flowsheet.  Outcome: Progressing Towards Goal  Note: Pressure Injury Interventions:  Sensory Interventions: Assess changes in LOC, Assess need for specialty bed    Moisture Interventions: Absorbent underpads, Apply protective barrier, creams and emollients    Activity Interventions: Increase time out of bed, Pressure redistribution bed/mattress(bed type)    Mobility Interventions: HOB 30 degrees or less, Pressure redistribution bed/mattress (bed type)    Nutrition Interventions: Document food/fluid/supplement intake    Friction and Shear Interventions: Apply protective barrier, creams and emollients                Problem: Patient Education: Go to Patient Education Activity  Goal: Patient/Family Education  Outcome: Progressing Towards Goal     Problem: Falls - Risk of  Goal: *Absence of Falls  Description: Document Schmid Fall Risk and appropriate interventions in the flowsheet.  Outcome: Progressing Towards Goal  Note: Fall Risk Interventions:  Mobility Interventions: Bed/chair exit alarm, Assess mobility with egress test    Mentation Interventions: Adequate sleep, hydration, pain control, Bed/chair exit alarm    Medication Interventions: Assess postural VS orthostatic hypotension, Bed/chair exit alarm    Elimination Interventions: Call light in reach, Bed/chair exit alarm              Problem: Patient Education: Go to Patient Education Activity  Goal: Patient/Family Education  Outcome: Progressing Towards Goal

## 2021-05-24 NOTE — Progress Notes (Signed)
BiPAP Progress Note    Patient currently is Off BiPAP  Patient off  BiPAP for the AM shift    BiPAP QHS & PRN    BiPAP settings are:  Bio-Med ID #: [id number]  IPAP:  18 cm H2O   EPAP: 10 cm H2O  Rate:   10  FiO2:   30 %     BiPAP will be removed from room after 48 hours of non-use

## 2021-05-24 NOTE — Progress Notes (Signed)
Problem: Pressure Injury - Risk of  Goal: *Prevention of pressure injury  Description: Document Braden Scale and appropriate interventions in the flowsheet.  Outcome: Progressing Towards Goal  Note: Pressure Injury Interventions:  Sensory Interventions: Assess changes in LOC, Keep linens dry and wrinkle-free, Maintain/enhance activity level, Minimize linen layers    Moisture Interventions: Moisture barrier, Minimize layers, Internal/External urinary devices    Activity Interventions: Increase time out of bed, Pressure redistribution bed/mattress(bed type)    Mobility Interventions: Pressure redistribution bed/mattress (bed type)    Nutrition Interventions: Document food/fluid/supplement intake    Friction and Shear Interventions: Lift sheet, Minimize layers

## 2021-05-25 LAB — BASIC METABOLIC PANEL
Anion Gap: 7 mmol/L (ref 5–15)
BUN: 14 mg/dl (ref 9–23)
CO2: 32 mEq/L — ABNORMAL HIGH (ref 20–31)
Calcium: 8.9 mg/dl (ref 8.7–10.4)
Chloride: 100 mEq/L (ref 98–107)
Creatinine: 0.52 mg/dl — ABNORMAL LOW (ref 0.55–1.02)
EGFR IF NonAfrican American: >60
GFR African American: >60
Glucose: 161 mg/dl — ABNORMAL HIGH (ref 74–106)
Potassium: 4 mEq/L (ref 3.5–5.1)
Sodium: 139 mEq/L (ref 136–145)

## 2021-05-25 LAB — CBC
Hematocrit: 34.9 % — ABNORMAL LOW (ref 37.0–50.0)
Hemoglobin: 10.8 gm/dl — ABNORMAL LOW (ref 13.0–17.2)
MCH: 27.1 pg (ref 25.4–34.6)
MCHC: 30.9 gm/dl (ref 30.0–36.0)
MCV: 87.5 fL (ref 80.0–98.0)
MPV: 12.3 fL — ABNORMAL HIGH (ref 6.0–10.0)
Platelets: 218 10*3/uL (ref 140–450)
RBC: 3.99 M/uL (ref 3.60–5.20)
RDW-SD: 53.8 — ABNORMAL HIGH (ref 36.4–46.3)
WBC: 15.6 10*3/uL — ABNORMAL HIGH (ref 4.0–11.0)

## 2021-05-25 LAB — POCT GLUCOSE
POC Glucose: 116 mg/dL — ABNORMAL HIGH (ref 65–105)
POC Glucose: 165 mg/dL — ABNORMAL HIGH (ref 65–105)
POC Glucose: 167 mg/dL — ABNORMAL HIGH (ref 65–105)
POC Glucose: 181 mg/dL — ABNORMAL HIGH (ref 65–105)

## 2021-05-25 LAB — METABOLIC PANEL, BASIC
Anion gap: 7 mmol/L (ref 5–15)
BUN: 14 mg/dl (ref 9–23)
CO2: 32 mEq/L — ABNORMAL HIGH (ref 20–31)
Calcium: 8.9 mg/dl (ref 8.7–10.4)
Chloride: 100 mEq/L (ref 98–107)
Creatinine: 0.52 mg/dl — ABNORMAL LOW (ref 0.55–1.02)
GFR est AA: >60
GFR est non-AA: >60
Glucose: 161 mg/dl — ABNORMAL HIGH (ref 74–106)
Potassium: 4 mEq/L (ref 3.5–5.1)
Sodium: 139 mEq/L (ref 136–145)

## 2021-05-25 LAB — GLUCOSE, POC
Glucose (POC): 116 mg/dL — ABNORMAL HIGH (ref 65–105)
Glucose (POC): 165 mg/dL — ABNORMAL HIGH (ref 65–105)
Glucose (POC): 167 mg/dL — ABNORMAL HIGH (ref 65–105)
Glucose (POC): 181 mg/dL — ABNORMAL HIGH (ref 65–105)

## 2021-05-25 LAB — CBC W/O DIFF
HCT: 34.9 % — ABNORMAL LOW (ref 37.0–50.0)
HGB: 10.8 gm/dl — ABNORMAL LOW (ref 13.0–17.2)
MCH: 27.1 pg (ref 25.4–34.6)
MCHC: 30.9 gm/dl (ref 30.0–36.0)
MCV: 87.5 fL (ref 80.0–98.0)
MPV: 12.3 fL — ABNORMAL HIGH (ref 6.0–10.0)
PLATELET: 218 10*3/uL (ref 140–450)
RBC: 3.99 M/uL (ref 3.60–5.20)
RDW-SD: 53.8 — ABNORMAL HIGH (ref 36.4–46.3)
WBC: 15.6 10*3/uL — ABNORMAL HIGH (ref 4.0–11.0)

## 2021-05-25 MED ORDER — PANTOPRAZOLE 40 MG TAB, DELAYED RELEASE
40 mg | ORAL_TABLET | Freq: Every day | ORAL | 1 refills | Status: AC
Start: 2021-05-25 — End: ?

## 2021-05-25 MED ORDER — PANTOPRAZOLE 40 MG TAB, DELAYED RELEASE
40 mg | Freq: Every day | ORAL | Status: DC
Start: 2021-05-25 — End: 2021-05-26
  Administered 2021-05-25: 18:00:00 via ORAL

## 2021-05-25 MED FILL — IPRATROPIUM-ALBUTEROL 2.5 MG-0.5 MG/3 ML NEB SOLUTION: 2.5 mg-0.5 mg/3 ml | RESPIRATORY_TRACT | Qty: 3

## 2021-05-25 MED FILL — DOCUSATE SODIUM 50 MG/5 ML ORAL LIQUID: 50 mg/5 mL | ORAL | Qty: 10

## 2021-05-25 MED FILL — PANTOPRAZOLE 40 MG IV SOLR: 40 mg | INTRAVENOUS | Qty: 40

## 2021-05-25 MED FILL — HEPARIN (PORCINE) 5,000 UNIT/ML IJ SOLN: 5000 unit/mL | INTRAMUSCULAR | Qty: 1

## 2021-05-25 MED FILL — CLONIDINE 0.2 MG TAB: 0.2 mg | ORAL | Qty: 1

## 2021-05-25 MED FILL — QUETIAPINE SR 50 MG 24 HR TAB: 50 mg | ORAL | Qty: 3

## 2021-05-25 MED FILL — AMLODIPINE 5 MG TAB: 5 mg | ORAL | Qty: 2

## 2021-05-25 MED FILL — PREDNISONE 20 MG TAB: 20 mg | ORAL | Qty: 2

## 2021-05-25 MED FILL — MONTELUKAST 10 MG TAB: 10 mg | ORAL | Qty: 1

## 2021-05-25 MED FILL — PANTOPRAZOLE 40 MG TAB, DELAYED RELEASE: 40 mg | ORAL | Qty: 1

## 2021-05-25 MED FILL — ESCITALOPRAM 10 MG TAB: 10 mg | ORAL | Qty: 1

## 2021-05-25 NOTE — Progress Notes (Signed)
Received call from Charge RN about transport. Fast track refused to transport patient due to weight and stair needs    Spoke with Alferd Patee will pick up patient at 8pm    Notified Judeth Cornfield patient has 2 story home per [facility]

## 2021-05-25 NOTE — Progress Notes (Signed)
BiPAP Progress Note    Patient currently is Off BiPAP  Patient refused BiPAP for the PM shift    BiPAP QHS & PRN    BiPAP settings are:  Bio-Med ID #: [id number]  IPAP:  18 cm H2O   EPAP: 10 cm H2O  Rate:   10  FiO2:   30 %     BiPAP was removed from room after 48 hours of non-use

## 2021-05-25 NOTE — Progress Notes (Signed)
 Progress Notes  by Lucia Aquas at 05/25/21 2115                Author: Lucia Aquas  Service: CASE MANAGEMENT  Author Type: Care Management       Filed: 06/17/21 0903  Date of Service: 05/25/21 2115  Status: Signed          Editor: Lucia Aquas (Care Management)                    Medical Necessity Certification Statement for Non-Emergency Ambulance Services         SECTION 1 - GENERAL INFORMATION          Patient: [name] [name]  Age: 70 y.o.  Sex: female          Date of Birth: [birth year]  Admit Date: 05/11/2021  PCP: Lucious Marko RIGGERS         MRN: [id number]   CSN: [id number]   Room: 5221/5221        Chief Complaint:      Chief Complaint       Patient presents with        ?  Respiratory Distress        ?  Cough         Height/Weight: 5'7 208lbs Body mass index is 32.63 kg/m. Body mass index is 32.63  kg/m.   Weight:     Weight: 94.5 kg (208 lb 5.4 oz)   Weight Source: Weight Source: Bed scale      Transport From:  [x]  Hospital    [] SNF   [] Residence   []  Other:     Transport To:      []  Hospital    [] SNF   [x] Residence   []  Other:     Address:    [address] [place] [address]      Medicare:      Insurance Information                                       [employer] MEDICARE/[facility] [place] MEDICARE HMO  Phone:  --             Subscriber:  Kimley, Apsey  Subscriber#:  [id number]             Group#:  [id number]  Precert#:  --                   Transport Date: May 25, 2021 (Valid for round trips this date, or for scheduled repetitive trips for 60 days from date signed below.)   Origin: [facility]   Destination: [facility] @PATIENTADDRESS @      Is the Patient's stay covered under Medicare Part A (PPS/DRG?)   [x]   Yes    [x]  No      Closest appropriate facility?   [x]  Yes    []  No     If no, why was the patient transported to another facility?     If hospital to hospital transfer, describe services needed at 2nd facility not available at 1st facility:     If hospice Pt, is this transport related to  Pt's terminal illness?  []   Yes    [x]  No   Describe:        SECTION II - MEDICAL NECESSITY QUESTIONNAIRE   Ambulance transportation is medically necessary only if other means  of transport are contraindicated or would be potentially    Harmful to the patient. To meet this requirement, the patient must be either bed confined or suffer from condition such    that transport by means other than an ambulance is contraindicated by the patient's condition.The following questions    must be addressed by the healthcare professional signing below for this form to be valid:      1) Describe the MEDICAL CONDITION space (physical and/or mental) of this patient AT THE TIME OF AMBULANCE TRANSPORT        that requires the patient to be transported in an ambulance, and why transport in an ambulance, and why transport by        other means is contraindicated by the patient's condition:        2) Is this patient bed confined as defined below?   [x]   Yes    []  No            To be bed confined the patient must satisfy all 3 of the following criteria:            (1) unable to get up from bed without assistance; AND            (2) unable to ambulate; AND            (3) unable to sit in a chair or wheelchair.      3) Can this patient safely be transported by car or wheelchair van (I.e., may safely sit during transport,        without an attendant or monitoring?)      []  Yes     [x]  No      4) In addition to completing questions 1-3 above, please check any of the following conditions that apply*:        *Note: Supporting documentation for any boxes checked must be maintained in the patient's medical records       [] Contractures    [] Non-healed fractures    [] Patient is confused     [] Patient is comatose     [x] Moderate/severe pain on movement    [] Danger to self/others    [] IV meds/fluids required   [] PIV:   []  Yes    []   No   [] Patient is combative  [] Need, or possible need, for restraints    [] DVT requires elevation of a lower  extremity    [] Medical attendant required    [x] Requires oxygen - unable to self-administer    [] Special handling/isolation/infection control precautions  required   [x] Unable to tolerate seated position for time needed  to transport    [] Hemodynamic monitoring required enroute     [] Unable to sit in a chair or wheelchair due to decubitus  ulcers or other wounds    [] Cardiac monitoring required enroute    [] Morbid obesity requires additional personnel/equipment  to safely handle patient     [] Orthopedic device (backboard, halo, pins, traction,  brace, wedge, etc.) requiring special handling during transport    [x] Other (specificy)  bedridden      SECTION III - SIGNATURE OF PHYSICIAN OR OTHER AUTHORIZED HEALTHCARE PROFESSIONAL   I certify that the above information is accurate based on my evaluation of this patient, and that the medical necessity provisions of 42 CFR 410.40 (e)(1) are met, requiring that this patient be transported  by ambulance.  I understand this information will be used by the centers for Medicare and Medicaid Services (CMS) to support the determination  of medical necessity for ambulance services.  I represent that I am the beneficiary's attending physician; or  an employee of the beneficiary's attending physician, or the hospital or facility where the beneficiary is being treated and from which the beneficiary is being transported; that I have personal knowledge of the beneficiary's condition at the time of  transport; and that I meet all Medicare regulations and applicable state licensure laws for the credentialed indicated.       [x] If this box  is checked, I also certify that the patient is physically or mentally incapable of signing the ambulance services claim form and that the institution with which I am affiliated has furnished care, services or assistance to the patient.  My signature  below was made on behalf of the patient pursuant to 25 RQM575.63(a)(5).  In accordance with  42  H118443, the specific reason(s) that the patient is physically or mentally incapable of signing the claim form is as follows.        PATIENT DATA:     ED Attending: No attending physician found       Visit Vitals      BP  (!) 133/91 (BP 1 Location: Right upper arm, BP Patient Position: Supine)        Pulse  89     Temp  97 F (36.1 C)     Resp  17     Ht  5' 7 (1.702 m)     Wt  94.5 kg (208 lb 5.4 oz)     SpO2  96%        BMI  32.63 kg/m              Past Medical History:        Diagnosis  Date         ?  Arthritis       ?  Chronic pain syndrome            related to R hip replacement         ?  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  DM2 (diabetes mellitus, type 2) (HCC)       ?  GERD (gastroesophageal reflux disease)       ?  Hepatitis C            HEP C         ?  HTN (hypertension)       ?  Lung nodule, multiple            (CT 10/2016) Small left upper lobe and 1.7 x 1.6 cm left lower lobe nodules not significantly changed from 07/23/2016 and 03/22/2016         ?  Marijuana use       ?  PTSD (post-traumatic stress disorder)            lived through the [place]         ?  Thoracic ascending aortic aneurysm (HCC)            4.4cm noted on CT Chest (10/2016)         ?  Thyroid nodule            2.5 cm stable right thyroid nodule (CT 10/2016)          Medications  Medications       acetaZOLAMIDE (DIAMOX) 500 mg in 0.9% sodium chloride  50 mL IVPB (0 mg IntraVENous Stopped 05/16/21 0748)     albuterol -ipratropium (DUO-NEB) 2.5 MG-0.5 MG/3 ML (3 mL Nebulization Given 05/11/21 2202)     methylPREDNISolone  (PF) (Solu-MEDROL ) injection 125 mg (125 mg IntraVENous Given 05/11/21 1931)     albuterol -ipratropium (DUO-NEB) 2.5 MG-0.5 MG/3 ML (3 mL Nebulization Given 05/11/21 2035)     iopamidoL  (ISOVUE -370) 76 % injection 85 mL (85 mL IntraVENous Given 05/11/21 2138)     albuterol -ipratropium (DUO-NEB) 2.5 MG-0.5 MG/3 ML (3 mL  Nebulization Given 05/11/21 2202)     piperacillin -tazobactam (ZOSYN ) 4.5 g in 0.9% sodium chloride  (MBP/ADV) 100 mL MBP (0 g IntraVENous Stopped 05/12/21 0934)     rocuronium  injection 100 mg (100 mg IntraVENous Given 05/12/21 0002)     etomidate  (AMIDATE ) 2 mg/mL injection 30 mg (30 mg IntraVENous Given 05/12/21 0002)     albuterol -ipratropium (DUO-NEB) 2.5 MG-0.5 MG/3 ML (3 mL Nebulization Given 05/12/21 0120)     vancomycin  (VANCOCIN ) 2000 mg in NS 500 ml infusion (0 mg IntraVENous IV Completed 05/12/21 0402)     sodium chloride  0.9 % bolus infusion 1,000 mL (0 mL IntraVENous IV Completed 05/12/21 0155)     vancomycin  trough due 12/1 @ 0900 ( Other Given 05/13/21 0900)     albuterol  CONCENTRATE 2.5mg /0.5 mL neb soln (10 mg Nebulization Given 05/12/21 0835)     calcium  gluconate 1 gram in sodium chloride  (ISO-OSM) 50 mL infusion (0 mg IntraVENous Stopped 05/12/21 1134)     insulin  regular (NOVOLIN  R, HUMULIN  R) injection 10 Units (10 Units IntraVENous Given 05/12/21 0931)     dextrose  (D50W) injection syrg 25 g (25 g IntraVENous Given 05/12/21 0927)     sodium bicarbonate  8.4 % (1 mEq/mL) injection 50 mEq (50 mEq IntraVENous Given 05/12/21 0920)     metoprolol  (LOPRESSOR ) injection 5 mg (5 mg IntraVENous Given 05/13/21 0955)     fentaNYL  citrate (PF) injection 25 mcg (25 mcg IntraVENous Given 05/14/21 0156)     sodium chloride  3% hypertonic nebulizer soln (4 mL Nebulization Given 05/18/21 0823)     albuterol -ipratropium (DUO-NEB) 2.5 MG-0.5 MG/3 ML (3 mL Nebulization Given 05/17/21 0049)     potassium chloride  (KAON 10%) 20 mEq/15 mL oral liquid 30 mEq (30 mEq Per G Tube Given 05/15/21 0826)     potassium chloride  (KAON 10%) 20 mEq/15 mL oral liquid 40 mEq (40 mEq Per G Tube Given 05/16/21 0700)     lidocaine (XYLOCAINE) 10 mg/mL (1 %) injection (  Given 05/16/21 1000)     furosemide  (LASIX ) injection 20 mg (20 mg IntraVENous Given 05/16/21 1548)     potassium chloride  SR (KLOR-CON  10) tablet 20 mEq (20 mEq Oral Given  05/18/21 0613)     albuterol -ipratropium (DUO-NEB) 2.5 MG-0.5 MG/3 ML (3 mL Nebulization Given 05/18/21 0822)     albuterol -ipratropium (DUO-NEB) 2.5 MG-0.5 MG/3 ML (3 mL Nebulization Given 05/18/21 0849)     furosemide  (LASIX ) injection 20 mg (20 mg IntraVENous Given 05/18/21 1134)     potassium chloride  10 mEq in 100 ml IVPB (10 mEq IntraVENous New Bag 05/18/21 1500)     ondansetron  (ZOFRAN ) injection 4 mg (4 mg IntraVENous Given 05/19/21 0620)     diphenhydrAMINE (BENADRYL) injection 50 mg (50 mg IntraVENous Given 05/19/21 0620)     potassium chloride  (K-DUR, KLOR-CON  M20) SR tablet 20 mEq (20 mEq Oral Given 05/20/21 2123)     potassium chloride  (  K-DUR, KLOR-CON  M20) SR tablet 20 mEq (20 mEq Oral Given 05/23/21 0942)       potassium chloride  (K-DUR, KLOR-CON  M20) SR tablet 40 mEq (40 mEq Oral Given 05/23/21 1238)             Allergies          Allergies        Allergen  Reactions         ?  Chocolate [Cocoa]  Sneezing             Confirms allergy but reports I still eat it             ED Course/Discharge          ED Course as of 06/17/21 0852       Tue May 11, 2021        1904  CBC with leukocytosis with left shift and blood gas consistent with severe COPD with respiratory acidosis with metabolic compensation.  Patient currently  on BiPAP to improve ventilation and to block the CO2.  Her proBNP is not elevated and her chest x-ray is not impressively wet with pulmonary edema.  Feel that the most likely driving etiology at this point is severe COPD exacerbation.  DuoNebs and methylprednisolone   ordered.  Respiratory at bedside. [MO]              ED Course User Index   [MO] Jeannette Cough, MD                  ICD-10-CM  ICD-9-CM          1.  Acute respiratory failure with hypoxia and hypercapnia (HCC)   J96.01  518.81           J96.02            2.  Leukocytosis, unspecified type   D72.829  288.60     3.  Acute hyperkalemia   E87.5  276.7          4.  Hypercarbia   R06.89  786.09        Home Health Care S[facility]       Discharge Medication List as of 05/25/2021  4:44 PM                 START taking these medications          Details        pantoprazole  (PROTONIX ) 40 mg tablet  Take 1 Tablet by mouth daily., Normal, Disp-30 Tablet, R-1               tiotropium bromide (SPIRIVA RESPIMAT) 2.5 mcg/actuation inhaler  Take 2 Puffs by inhalation daily., Normal, Disp-4 g, R-1               predniSONE (DELTASONE) 20 mg tablet  Take 2 Tablets by mouth daily (with breakfast) for 2 days., Normal, Disp-4 Tablet, R-0               amLODIPine  (NORVASC ) 10 mg tablet  Take 1 Tablet by mouth daily., Normal, Disp-30 Tablet, R-1                        CONTINUE these medications which have CHANGED          Details        potassium chloride  SR (K-TAB ) 20 mEq tablet  Take 1 Tablet by mouth every other day., Historical Med  CONTINUE these medications which have NOT CHANGED          Details        cloNIDine HCL (CATAPRES) 0.2 mg tablet  Take 0.2 mg by mouth two (2) times a day., Historical Med               QUEtiapine  SR (SEROquel  XR) 150 mg sr tablet  Take 150 mg by mouth nightly., Historical Med               AZO CRANBERRY 450-30-50 mg-mg-million tab  Take 1 Tablet by mouth daily., Historical Med               multivitamin (ONE A DAY) tablet  Take 1 Tablet by mouth daily., Historical Med               acetaminophen  (Tylenol  Extra Strength) 500 mg tablet  Take 500 mg by mouth two (2) times daily as needed for Pain., Historical Med               apixaban  (Eliquis ) 5 mg tablet  Take 5 mg by mouth every twelve (12) hours., Historical Med               escitalopram oxalate (LEXAPRO) 10 mg tablet  Take 10 mg by mouth daily., Historical Med               ondansetron  (ZOFRAN  ODT) 4 mg disintegrating tablet  Take 4 mg by mouth every eight (8) hours as needed for Nausea or Vomiting., Historical Med               allopurinoL  (ZYLOPRIM ) 100 mg tablet  TAKE 1 TABLET TWICE DAILY, Normal, Disp-180 Tablet, R-1               insulin  aspart U-100  (NovoLOG  U-100 Insulin  aspart) 100 unit/mL injection  7 Units by SubCUTAneous route Before breakfast, lunch, and dinner., No Print, Disp-10 mL, R-0               insulin  glargine (Lantus  Solostar U-100 Insulin ) 100 unit/mL (3 mL) inpn  25 Units by SubCUTAneous route nightly., No Print, Disp-1 Pen, R-0               senna-docusate (PERICOLACE) 8.6-50 mg per tablet  Take 2 Tablets by mouth nightly., Normal, Disp-100 Tablet, R-0               losartan  (COZAAR ) 100 mg tablet  Take 1 Tablet by mouth daily., Normal, Disp-30 Tablet, R-0               cetirizine (ZYRTEC) 10 mg tablet  Take 1 Tab by mouth daily., Normal, Disp-30 Tab, R-1               metFORMIN ER (GLUCOPHAGE XR) 500 mg tablet  TAKE 1 TABLET TWICE DAILY, Normal, Disp-180 Tab, R-1               montelukast  (SINGULAIR ) 10 mg tablet  TAKE 1 TABLET EVERY DAY, Normal, Disp-90 Tab, R-3               fluticasone  propion-salmeterol (ADVAIR DISKUS) 100-50 mcg/dose diskus inhaler  Take 1 Puff by inhalation two (2) times a day., Normal, Disp-1 Inhaler, R-0               rosuvastatin  (CRESTOR ) 5 mg tablet  TAKE 1 TABLET BY MOUTH NIGHTLY, Normal, Disp-90 Tab, R-1  albuterol  (PROVENTIL  HFA, VENTOLIN  HFA, PROAIR  HFA) 90 mcg/actuation inhaler  Take 2 Puffs by inhalation every four (4) hours as needed for Wheezing., Normal, Disp-1 Inhaler, R-1               albuterol  (ACCUNEB ) 1.25 mg/3 mL nebu  USE 1 VIAL IN NEBULIZER EVERY 6 HOURS AS NEEDED FOR  SHORTNESS  OF  BREATH,  WHEEZING, Normal, Disp-270 mL, R-1                        STOP taking these medications                  dilTIAZem CD (CARDIZEM CD) 240 mg ER capsule  Comments:    Reason for Stopping:                                  Electronically signed by:   Karna Scull   June 17, 2021   8:52 AM         Name: __________________________________________________________________   Printed Name and Credentials Authorized Healthcare Professional (MD, DO, RN, etc)   *Form must be signed only by patient's  attending physician for scheduled, repetitive transports.   For non-repetitive ambulance transports, if unable to obtain the signature of the attending physician,   Any of the following may sign (please check appropriate box below):         [] Physician Assistant   [] Clinical Nurse Specialist  [x]  Case Manager  [] Social Worker    [] Nurse Practitioner     [] Registered Nurse             []  Discharge Planner         Dispatch Phone: [phone]   Dispatch Fax:     [phone]

## 2021-05-25 NOTE — Progress Notes (Signed)
Problem: Patient Education: Go to Patient Education Activity  Goal: Patient/Family Education  Outcome: Progressing Towards Goal

## 2021-05-25 NOTE — Progress Notes (Signed)
Progress Notes by Zoe Lan, MD at 05/25/21 [phone]                Author: Zoe Lan, MD  Service: Pulmonary Disease  Author Type: Physician       Filed: 05/25/21 0931  Date of Service: 05/25/21 0659  Status: Signed          Editor: Zoe Lan, MD (Physician)                              [facility] PULMONARY AND CRITICAL CARE MEDICINE           Progress Note          Name:  [name] [name]        DOB:  1950-07-18     MRN:  [id number]        Date:  05/25/2021      [x]  I have reviewed the flowsheet and previous days notes. Events, vitals, medications and notes from last 24 hours reviewed.         ASSESSMENT:         1.  Acute hypoxic hypercapnic respiratory failure in the setting of COPD and pneumonia. Uses 3L HOT   2.  CAP with L mucus plugging / atelectasis.  H/o same in 2020 on CT.   s/p full bronchoscopy 05/16/21 -> mild secretions / irritation L airways/ no mucus plug - dynamic collapse Sup segt LLL.  Leukocytosis  resolved.  Sputum culture 05/12/2021 negative.  Bronch culture pathology negative.  Completed antibiotics.   3.  1 out of 2 blood cultures positive for Corynebacterium species on 05/11/2021.  Likely contamination.  MRSA swab negative.   4.  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 05/13/2021, now Dr. Helene Shoe.  on Advair, Spiriva and Singulair at home.   5.  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    6.  Hx Lung nodule - 05/11/21 CTA Chest with LLL nodule (2 cm) - Chronic stable s/p CT guided bx in 2017   7.  Hx HTN, HLD, DM2, HepC, PTSD, Chronic pain   8.  Hx Thoracic ascending aortic aneurysm   9.  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  Resolved on PVL 05/12/2021.  Eliquis held due to mild hemoptysis.             PLAN:     -Target O2 sat 88% and above. Avoid hyperoxia.  Continue BiPAP nightly and as needed.   - Continue prednisone 40 mg a day and taper by 10 mg every 3 days to baseline prednisone of 10 mg a day.      -Continue DuoNebs to as needed  - Continue Breo, Singulair and Spiriva   -Has chronic hypercapnia and may need PSG as outpatient for evaluation for BiPAP.     -Stable from pulmonary standpoint.  Can be discharged home from pulm standpoint.  Should follow-up with Dr. 05/14/2021 in office in about 4 weeks.   PCCM will sign off.                   SUBJECTIVE:   Breathing better.  Wants to go home.  No overnight events.   ROS:    Otherwise negative except for as mentioned in subjective.       Allergy:     Allergies  Allergen  Reactions         ?  Chocolate [Cocoa]  Sneezing             Confirms allergy but reports "I still eat it"            Vital Signs:       Visit Vitals   BP  137/86 (BP 1 Location: Right upper arm, BP Patient Position: Lying left side)      Pulse  67      Temp  97 ??F (36.1 ??C)      Resp  17      Ht   (1.702 m)      Wt  94.5 kg (208 lb 5.4 oz)      SpO2  98%      BMI  32.63 kg/m??                O2 Device: Nasal cannula     O2 Flow Rate (L/min): 4 l/min       Temp (24hrs), Avg:97.3 ??F (36.3 ??C), Min:97 ??F (36.1 ??C), Max:97.7 ??F (36.5 ??C)        Patient Vitals for the past 8 hrs:            Temp  Pulse  Resp  BP  SpO2            05/25/21 0518  --  --  --  --  98 %            05/25/21 0459  97 ??F (36.1 ??C)  67  17  137/86  97 %            05/24/21 2345  97 ??F (36.1 ??C)  78  18  127/83  100 %              Intake/Output:       Last shift:      12/12 1901 - 12/13 0700   In: -    Out: 1100 [Urine:1100]   Last 3 shifts: 12/11 0701 - 12/12 1900   In: 720 [P.O.:720]   Out: 1900 [Urine:1900]      Intake/Output Summary (Last 24 hours) at 05/25/2021 0659   Last data filed at 05/25/2021 0459     Gross per 24 hour        Intake  480 ml        Output  2300 ml        Net  -1820 ml               Physical Exam:   GENERAL: AAO x4 .    NECK: Supple. Trachea midline.    RESPIRATORY: Bilateral BS present, decreased at bases. No rales or rhonchi.    CARDIOVASCULAR: S1 and S2 present, regular. No murmur, rub, or thrill.     ABDOMEN: Soft and nontender with positive bowel sounds. No organomegaly.    NEUROLOGICAL: No focal deficits   EXT: No edema or cyanosis.          DATA:         Current Facility-Administered Medications          Medication  Dose  Route  Frequency           ?  escitalopram oxalate (LEXAPRO) tablet 10 mg   10 mg  Oral  DAILY     ?  QUEtiapine SR (SEROquel XR) tablet 150 mg   150 mg  Oral  QHS     ?  albuterol-ipratropium (DUO-NEB) 2.5 MG-0.5 MG/3 ML   3 mL  Nebulization  Q6H PRN     ?  ondansetron (ZOFRAN) injection 4 mg   4 mg  IntraVENous  Q6H PRN     ?  predniSONE (DELTASONE) tablet 40 mg   40 mg  Oral  DAILY WITH BREAKFAST     ?  glucagon (GLUCAGEN) injection 1 mg   1 mg  IntraMUSCular  PRN     ?  insulin glargine (LANTUS) injection 1-100 Units   1-100 Units  SubCUTAneous  QHS     ?  insulin lispro (HUMALOG) injection 1-100 Units   1-100 Units  SubCUTAneous  AC&HS     ?  insulin lispro (HUMALOG) injection 1-100 Units   1-100 Units  SubCUTAneous  PRN     ?  acetaminophen (TYLENOL) tablet 650 mg   650 mg  Oral  Q6H PRN     ?  fluticasone-vilanterol (BREO ELLIPTA) 22mcg-25mcg/puff   1 Puff  Inhalation  DAILY     ?  tiotropium bromide (SPIRIVA RESPIMAT) 2.5 mcg /actuation   2 Puff  Inhalation  DAILY     ?  cloNIDine HCL (CATAPRES) tablet 0.2 mg   0.2 mg  Oral  BID     ?  amLODIPine (NORVASC) tablet 10 mg   10 mg  Oral  DAILY           ?  heparin (porcine) injection 5,000 Units   5,000 Units  SubCUTAneous  Q8H           ?  hydrALAZINE (APRESOLINE) 20 mg/mL injection 20 mg   20 mg  IntraVENous  Q6H PRN     ?  pantoprazole (PROTONIX) 40 mg in 0.9% sodium chloride 10 mL injection   40 mg  IntraVENous  DAILY     ?  dextrose (D50W) injection syrg 5-25 g   10-50 mL  IntraVENous  PRN     ?  docusate (COLACE) 50 mg/5 mL oral liquid 100 mg   100 mg  Oral  BID     ?  montelukast (SINGULAIR) tablet 10 mg   10 mg  Oral  QHS           ?  white petrolatum-mineral oiL (LACRILUBE S.O.P.) ointment 1 Each   1 Each  Both Eyes  DAILY                     Labs:   Reviewed.      Recent Results (from the past 24 hour(s))     GLUCOSE, POC          Collection Time: 05/24/21  7:37 AM         Result  Value  Ref Range            Glucose (POC)  136 (H)  65 - 105 mg/dL       GLUCOSE, POC          Collection Time: 05/24/21 11:55 AM         Result  Value  Ref Range            Glucose (POC)  212 (H)  65 - 105 mg/dL       GLUCOSE, POC          Collection Time: 05/24/21  5:02 PM         Result  Value  Ref Range            Glucose (  POC)  234 (H)  65 - 105 mg/dL       GLUCOSE, POC          Collection Time: 05/24/21  9:17 PM         Result  Value  Ref Range            Glucose (POC)  116 (H)  65 - 105 mg/dL             Imaging:   I have personally reviewed imaging.             Zoe Lan, MD         Dragon medical dictation software was used for portions of this report. Unintended voice transcription errors may have occurred.

## 2021-05-25 NOTE — Progress Notes (Signed)
Discharge Plan:   home with home health     Discharge Date:     05/25/2021     Assisted Living Facility:    n/a     Is patient going to snf? declined    Home Health Needed:     yes     Home Health Agency:    [facility]     FOC given: YES     Confirmed start of care with the home health agency and spoke with:      EMAIL SENT PER PROTOCOL     DME needed and ordered for Discharge:    NO NEW DME NEEDED     TCC Referral:     n/a     Medication Assistance given   N       Transportation: Medical       Transport Time:    pending    Family, MD, patient, nurse and facility aware of pickup time    yes

## 2021-05-25 NOTE — Progress Notes (Signed)
Problem: Non-Violent Restraints  Goal: Removal from restraints as soon as assessed to be safe  05/25/2021 1644 by Ernestina Patches  Outcome: Resolved/Met  05/25/2021 1358 by Ernestina Patches  Outcome: Progressing Towards Goal  Goal: No harm/injury to patient while restraints in use  05/25/2021 1644 by Ernestina Patches  Outcome: Resolved/Met  05/25/2021 1358 by Ernestina Patches  Outcome: Progressing Towards Goal  Goal: Patient's dignity will be maintained  05/25/2021 1644 by Ernestina Patches  Outcome: Resolved/Met  05/25/2021 1358 by Ernestina Patches  Outcome: Progressing Towards Goal  Goal: Patient Interventions  05/25/2021 1644 by Ernestina Patches  Outcome: Resolved/Met  05/25/2021 1358 by Ernestina Patches  Outcome: Progressing Towards Goal

## 2021-05-25 NOTE — Discharge Summary (Signed)
Discharge Summary by Eben Burow, MD at 05/25/21 2115                Author: Eben Burow, MD  Service: Hospitalist  Author Type: Physician       Filed: 06/01/21 1146  Date of Service: 05/25/21 2115  Status: Signed          Editor: Eben Burow, MD (Physician)                       [facility] MEDICINE    Discharge Note   05/25/2021            Patient: [name] [name]   Age: 70 y.o.      Sex: female          Date of Birth: 12-Sep-1950   Admit Date: 05/11/2021  PCP: Marjory Lies          MRN: [id number]   CSN: [id number]           Discharge Diagnoses:      1.  Acute respiratory failure with hypoxia and hypercapnia (HCC)      2.  Leukocytosis, unspecified type      3.  Acute hyperkalemia         4.  Hypercarbia             Additional Hospital Diagnoses:   1.  Acute on chronic hypoxic hypercapnic respiratory failure requiring intubation  on 05/11/21 -> extubated on 05/17/21   2.  Community-acquired pneumonia with left mucous plugging/atelectasis, s/p bronchoscopy with 05/16/21 -mild secretions, irritation left airways, no mucous plug, dynamic collapse Sup segt LLL -> cultures negative, cytology negative for  malignant cells   3.  Blood cultures 1 out of 2 positive for Corynebacterium, likely procurement contamination   4.  Hemoptysis secondary to pneumonia   5.  Acute COPD exacerbation - on 3L supplemental oxygen at baseline   6.  Bullous emphysema   7.  Hyperkalemia   8.  Leukocytosis,    9.  Bullous emphysema   10.  Hypertension   11.  Dyslipidemia   12.  Type 2 diabetes mellitus   13.  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  Chronic pain   15.  2 cm left lower lobe lung nodule-will need outpatient follow-up   16.  UDS positive for marijuana   17.  Diabetic neuropathy   18.  Physical deconditioning. Bedridden at baseline      Admission HPI:   [name] [name] is a 70 y.o. female with hx of HTN,  COPD, GERD, DM II, Hepatitis C, PTSD, and Chronic Pain Syndrome who presents to the ED via EMS with reported history of shortness of breath over  the past several days.  Patient's symptoms became worse in severity and EMS was called.  Patient was found to be minimally responsive on initial evaluation in the ED, and ABG revealed a pH of 7.201 and a PCO2 of 114.3.  Patient did not improve on BiPAP  and patient spouse agreed to intubation.  Patient was then emergently intubated in the ED.  HPI is otherwise unable to be obtained due to patient's medical condition.      Summary of Hospital Course:   Patient was admitted on 05/11/21 for acute hypoxic hypercapnic respiratory failure due to COPD exacerbation   She was intubated and admitted to ICU  She was started on nebulized bronchodilator, IV steroid. She was started broad-spectrum IV antibiotics.   She had prolonged intubation.   Serial chest x-rays showed  right lower lobe airspace disease, stable left lung airspace disease.    She underwent bronchoscopy on 05/16/2021.  Findings mentioned above.   ECHO showed EF 61%, normal diastolic function       Cultures remained negative. She completed 7 days of IV Zosyn       She was eventually extubated on 05/17/21   She has been requiring BiPAP on and off and at night time.   Discussed with pulmonology that she might need BiPAP/trilogy at home for symptom management and to decrease the risk of hospitalization   She is very deconditioned and weak but does not want to go to SNF. She wants to go home with home health       Discharge medications, follow-ups and instructions as listed below.         Physical Exam on Discharge:   Visit Vitals      BP  (!) 133/91 (BP 1 Location: Right upper arm, BP Patient Position: Supine)     Pulse  89     Temp  97 ??F (36.1 ??C)     Resp  17     Ht   (1.702 m)     Wt  94.5 kg (208 lb 5.4 oz)     SpO2  96%        BMI  32.63 kg/m??        Most Recent Labs and Imaging:   Labs:   No results found for  this or any previous visit (from the past 24 hour(s)).       Imaging:     CT HEAD WO CONT       Final Result     IMPRESSION:     1. No acute intracranial abnormalities.     2. Diffuse parenchymal volume loss and advanced chronic small vessel ischemic     change.     3. Stable small left petrous ridge meningioma.          Electronically signed by: Mikal Plane, MD 05/19/2021 6:33 PM EST                 XR CHEST SNGL V       Final Result     Impression:           Worsening right lower lobe airspace disease. Stable left lung airspace disease          Electronically signed by: Jodi Mourning, MD 05/18/2021 9:42 AM EST                 XR CHEST SNGL V       Final Result     Impression:          AP portable upright view of the chest demonstrates that the cardiac silhouette     is not  enlarged. NG and ET tubes no longer seen. Stable trace bilateral pleural     effusions. Stable left basilar and left mid lung infiltrates. Increasing right     lower lobe airspace disease/atelectasis. Stable left upper lobe bulla.     Previously visualized left lung nodule is obscured by external leads.          Electronically signed by: Jodi Mourning, MD 05/17/2021 5:43 PM EST  DUPLEX LOWER EXT VENOUS BILAT       Final Result            XR CHEST SNGL V       Final Result     IMPRESSION:     1. Stable support tubes.     2. Stable left mid chest infiltrate and left mid chest nodule.     3. Left upper lobe bullous emphysema.          Electronically signed by: Earline Mayotte, MD 05/17/2021 4:21 AM EST                 XR CHEST SNGL V       Final Result     IMPRESSION:     1. No change in the triangular infiltrates left mid chest and left lung nodule.     2. Stable support tubes. Distal extent nasogastric tube cannot be evaluated. Not     on the film.     3. Large left upper lobe bulla.            Electronically signed by: Earline Mayotte, MD 05/14/2021 5:03 AM EST                   DUPLEX UPPER EXT VENOUS LEFT       Final Result             XR CHEST SNGL V       Final Result     IMPRESSION:     1. Opacity left perihilar regions secondary to atelectasis from     secretions/aspiration developed since radiograph of 1808 hours.     2. Large bullae left upper lobe.     3. Atelectasis left lung base.          Electronically signed by: Jolayne Panther, MD 05/12/2021 12:01 AM EST                 CT CHEST W CON PE PROTOCOL       Final Result     IMPRESSION:     1. No pulmonary embolism.     2. Bilateral bullae     3. Left upper lobe pneumonia/volume loss. Radiographic follow-up to resolution     suggested.     4. 2 cm left lower lobe nodule not significantly changed from prior studies.     5. 2.3 cm stable splenic lesion, unchanged from 02/18/2018.     6. 1.8 cm probable cyst midpole right kidney, ultrasound characterization     suggested.          Electronically signed by: Jolayne Panther, MD 05/11/2021 10:11 PM EST                 XR CHEST SNGL V       Final Result     IMPRESSION: No acute cardiopulmonary process.          Electronically signed by: Albin Felling, MD 05/11/2021 6:18 PM EST                        Discharge Meds & Instructions:   Discharge Meds:     Discharge Medication List as of 05/25/2021  4:44 PM                 START taking these medications          Details        pantoprazole (PROTONIX) 40 mg tablet  Take 1 Tablet by mouth daily., Normal, Disp-30 Tablet, R-1               tiotropium bromide (SPIRIVA RESPIMAT) 2.5 mcg/actuation inhaler  Take 2 Puffs by inhalation daily., Normal, Disp-4 g, R-1               predniSONE (DELTASONE) 20 mg tablet  Take 2 Tablets by mouth daily (with breakfast) for 2 days., Normal, Disp-4 Tablet, R-0               amLODIPine (NORVASC) 10 mg tablet  Take 1 Tablet by mouth daily., Normal, Disp-30 Tablet, R-1                        CONTINUE these medications which have CHANGED          Details        potassium chloride SR (K-TAB) 20 mEq tablet  Take 1 Tablet by mouth every other day., Historical Med                         CONTINUE these medications which have NOT CHANGED          Details        cloNIDine HCL (CATAPRES) 0.2 mg tablet  Take 0.2 mg by mouth two (2) times a day., Historical Med               QUEtiapine SR (SEROquel XR) 150 mg sr tablet  Take 150 mg by mouth nightly., Historical Med               AZO CRANBERRY 450-30-50 mg-mg-million tab  Take 1 Tablet by mouth daily., Historical Med               multivitamin (ONE A DAY) tablet  Take 1 Tablet by mouth daily., Historical Med               acetaminophen (Tylenol Extra Strength) 500 mg tablet  Take 500 mg by mouth two (2) times daily as needed for Pain., Historical Med               apixaban (Eliquis) 5 mg tablet  Take 5 mg by mouth every twelve (12) hours., Historical Med               escitalopram oxalate (LEXAPRO) 10 mg tablet  Take 10 mg by mouth daily., Historical Med               ondansetron (ZOFRAN ODT) 4 mg disintegrating tablet  Take 4 mg by mouth every eight (8) hours as needed for Nausea or Vomiting., Historical Med               allopurinoL (ZYLOPRIM) 100 mg tablet  TAKE 1 TABLET TWICE DAILY, Normal, Disp-180 Tablet, R-1               insulin aspart U-100 (NovoLOG U-100 Insulin aspart) 100 unit/mL injection  7 Units by SubCUTAneous route Before breakfast, lunch, and dinner., No Print, Disp-10 mL, R-0               insulin glargine (Lantus Solostar U-100 Insulin) 100 unit/mL (3 mL) inpn  25 Units by SubCUTAneous route nightly., No Print, Disp-1 Pen, R-0               senna-docusate (PERICOLACE) 8.6-50 mg per tablet  Take 2 Tablets by mouth nightly., Normal, Disp-100 Tablet, R-0  losartan (COZAAR) 100 mg tablet  Take 1 Tablet by mouth daily., Normal, Disp-30 Tablet, R-0               albuterol (PROVENTIL HFA, VENTOLIN HFA, PROAIR HFA) 90 mcg/actuation inhaler  Take 2 Puffs by inhalation every four (4) hours as needed for Wheezing., Normal, Disp-1 Inhaler, R-1               cetirizine (ZYRTEC) 10 mg tablet  Take 1 Tab by mouth daily., Normal,  Disp-30 Tab, R-1               albuterol (ACCUNEB) 1.25 mg/3 mL nebu  USE 1 VIAL IN NEBULIZER EVERY 6 HOURS AS NEEDED FOR  SHORTNESS  OF  BREATH,  WHEEZING, Normal, Disp-270 mL, R-1               metFORMIN ER (GLUCOPHAGE XR) 500 mg tablet  TAKE 1 TABLET TWICE DAILY, Normal, Disp-180 Tab, R-1               montelukast (SINGULAIR) 10 mg tablet  TAKE 1 TABLET EVERY DAY, Normal, Disp-90 Tab, R-3               fluticasone propion-salmeterol (ADVAIR DISKUS) 100-50 mcg/dose diskus inhaler  Take 1 Puff by inhalation two (2) times a day., Normal, Disp-1 Inhaler, R-0               rosuvastatin (CRESTOR) 5 mg tablet  TAKE 1 TABLET BY MOUTH NIGHTLY, Normal, Disp-90 Tab, R-1                        STOP taking these medications                  dilTIAZem CD (CARDIZEM CD) 240 mg ER capsule  Comments:    Reason for Stopping:                              Discharge Instructions:     Follow-up Information                  Follow up With  Specialties  Details  Why  Contact Info              [name], PA-C  Physician Assistant  Schedule an appointment as soon as possible for a visit in 1 week(s)  office is closed today pt needs to call the office tomorrow 05/24/21 to schedule, HOSPITAL FOLLOW-UP  [address]   [address]   [place] [address]   [phone]          Verlan Friends, MD  Pulmonary Disease, Critical Care Medicine  Schedule an appointment as soon as possible for a visit in 2 week(s)  office is closed today pt needs to call the office tomorrow 05/24/21 to schedule, HOSPITAL FOLLOW-UP  [address]   [address]   [place] [address]   [phone]                 [facility]  Follow up    [address], [place] [address]   [phone]                   Condition at discharge: Stable for discharge    Disposition:  Home Health Care Svc    High risk for  readmission - yes      Total discharge time spent in making arrangements  for continued care and in patient / family teaching > 35 minutes.          Karsten Ro, MD   [facility]   Pager: [phone]   06/01/2021, 11:42 AM

## 2021-05-25 NOTE — Progress Notes (Signed)
Problem: Pressure Injury - Risk of  Goal: *Prevention of pressure injury  Description: Document Braden Scale and appropriate interventions in the flowsheet.  Outcome: Progressing Towards Goal  Note: Pressure Injury Interventions:  Sensory Interventions: Assess changes in LOC, Assess need for specialty bed    Moisture Interventions: Absorbent underpads, Apply protective barrier, creams and emollients    Activity Interventions: Increase time out of bed, Pressure redistribution bed/mattress(bed type)    Mobility Interventions: HOB 30 degrees or less, Pressure redistribution bed/mattress (bed type)    Nutrition Interventions: Document food/fluid/supplement intake    Friction and Shear Interventions: Apply protective barrier, creams and emollients

## 2021-05-25 NOTE — Progress Notes (Signed)
Transport to: Home Address  Reason for transport: O2 @ 4L due to hypoxic hypercapnic respiratory failure requiring intubation, osteoarthritis of bilateral hips, chronic pain syndrome, bedridden at baseline  Transport set up with: [facility]  Time/Date: 05/25/21@ 2000  D/C Summary loaded: pending  Nurse/CM notified: yes  Envelope delivered: yes  Insurance verified on face sheet: yes  Auth needed: no  Auth #:

## 2021-05-25 NOTE — Progress Notes (Signed)
Received report from AM shift nurse pt supposed to undergo apnea link test last night, AM RT aware of the order and set up has been done.  Follow up w/ RT (Mi'Chal) around past midnight, test has not been done yet and was told it wasn't endorse to her and she can't see any order for apnea link in her paper or computer. Will endorse to incoming shift.

## 2021-06-03 ENCOUNTER — Inpatient Hospital Stay: Admit: 2021-06-03 | Primary: Physician Assistant

## 2021-06-17 ENCOUNTER — Encounter: Payer: MEDICARE | Attending: Orthopaedic Surgery | Primary: Physician Assistant

## 2021-07-29 ENCOUNTER — Inpatient Hospital Stay: Primary: Physician Assistant

## 2021-08-26 ENCOUNTER — Inpatient Hospital Stay: Admit: 2021-08-26 | Primary: Physician Assistant

## 2021-09-03 LAB — HEMOGLOBIN A1C
Estimated Avg Glucose, External: 146 mg/dL — ABNORMAL HIGH (ref 91–123)
Hemoglobin A1C, External: 6.7 % — ABNORMAL HIGH (ref 4.8–5.6)

## 2021-09-16 ENCOUNTER — Encounter: Attending: Orthopaedic Surgery | Primary: Physician Assistant

## 2021-09-16 ENCOUNTER — Encounter: Payer: MEDICARE | Attending: Orthopaedic Surgery | Primary: Physician Assistant

## 2021-11-10 MED ORDER — ALLOPURINOL 100 MG PO TABS
100 MG | ORAL_TABLET | ORAL | 2 refills | Status: AC
Start: 2021-11-10 — End: ?

## 2022-02-03 ENCOUNTER — Encounter: Attending: Orthopaedic Surgery | Primary: Physician Assistant

## 2022-02-13 ENCOUNTER — Emergency Department: Admit: 2022-02-14 | Payer: MEDICARE | Primary: Physician Assistant

## 2022-02-13 DIAGNOSIS — J9601 Acute respiratory failure with hypoxia: Secondary | ICD-10-CM

## 2022-02-13 DIAGNOSIS — A419 Sepsis, unspecified organism: Secondary | ICD-10-CM

## 2022-02-13 DIAGNOSIS — J9602 Acute respiratory failure with hypercapnia: Secondary | ICD-10-CM
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
## 2022-02-13 NOTE — ED Notes (Signed)
Pt intubated. Pt medicated with Narcan 2mg  , etomidate 20mg  and Roc 100mg  2242. Pt intubated 7.5 ETT, 23 @ lip. Pt vomited just prior to intubation and suctioned. Xray ordered to verify placement.      , RN  02/13/22 2352
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
## 2022-02-14 ENCOUNTER — Inpatient Hospital Stay: Admit: 2022-02-14 | Payer: MEDICARE | Primary: Physician Assistant

## 2022-02-14 ENCOUNTER — Inpatient Hospital Stay
Admission: EM | Admit: 2022-02-14 | Discharge: 2022-02-26 | Disposition: A | Discharge status: Skilled Nursing Facility | Payer: MEDICARE | Admitting: Internal Medicine

## 2022-02-14 ENCOUNTER — Emergency Department: Admit: 2022-02-14 | Payer: MEDICARE | Primary: Physician Assistant

## 2022-02-14 DIAGNOSIS — J9601 Acute respiratory failure with hypoxia: Secondary | ICD-10-CM

## 2022-02-14 DIAGNOSIS — J9602 Acute respiratory failure with hypercapnia: Secondary | ICD-10-CM

## 2022-02-14 LAB — POC CG4:BLOOD GAS,LACTATE
Base Excess: 11 mmol/L — ABNORMAL HIGH (ref <=3)
Base Excess: 14 mmol/L — ABNORMAL HIGH (ref <=3)
Base Excess: 13 mmol/L — ABNORMAL HIGH (ref <=3)
FIO2: 70
FIO2: 50
FIO2: 40
FIO2: 50
HCO3: 39.1 mmol/L (ref 18.0–26.0)
HCO3: 39.3 mmol/L (ref 18.0–26.0)
HCO3: 37.3 mmol/L (ref 18.0–26.0)
Lactate: 3.81 mmol/L — ABNORMAL HIGH (ref 0.40–2.00)
Lactate: 2.2 mmol/L — ABNORMAL HIGH (ref 0.40–2.00)
Lactate: 1.23 mmol/L (ref 0.40–2.00)
Lactate: 1.15 mmol/L (ref 0.40–2.00)
O2 Sat: 88 % — ABNORMAL LOW (ref 90–100)
O2 Sat: 85 % — ABNORMAL LOW (ref 90–100)
O2 Sat: 98 % (ref 90–100)
PCO2: >130 mm Hg (ref 35.0–45.0)
PCO2: 102.3 mm Hg (ref 35.0–45.0)
PCO2: 65.2 mm Hg (ref 35.0–45.0)
PCO2: 56.6 mm Hg — ABNORMAL HIGH (ref 35.0–45.0)
PO2: 175 mm Hg — ABNORMAL HIGH (ref 75–100)
PO2: 72 mm Hg — ABNORMAL LOW (ref 75–100)
PO2: 53 mm Hg — CL (ref 75–100)
PO2: 109 mm Hg — ABNORMAL HIGH (ref 75–100)
PRESSURE CONTROL: 32
PRESSURE CONTROL: 35
PRESSURE CONTROL: 30
PRESSURE CONTROL: 26
Peep: 5
Peep: 10
Peep: 10
Peep: 5
Ph: 7.033 — CL (ref 7.350–7.450)
Ph: 7.19 — CL (ref 7.350–7.450)
Ph: 7.388 (ref 7.350–7.450)
Ph: 7.427 (ref 7.350–7.450)
Pt Temp: 37
Respiratory Rate: 16
Respiratory Rate: 16
Respiratory Rate: 22
Respiratory Rate: 22
Set Rate: 16
Set Rate: 16
Set Rate: 22
Set Rate: 22
Total CO2: 42 mmol/L — ABNORMAL HIGH (ref 24–29)
Total CO2: 41 mmol/L — ABNORMAL HIGH (ref 24–29)
Total CO2: 39 mmol/L — ABNORMAL HIGH (ref 24–29)
VT Expiration: 275
VT Expiration: 442
VT Expiration: 303
VT Expiration: 279

## 2022-02-14 LAB — EKG 12-LEAD
Atrial Rate: 72 {beats}/min
Calculated P Axis: 96 degrees
Calculated R Axis: 72 degrees
Calculated T Axis: 66 degrees
DIAGNOSIS, 93000: NORMAL
P-R Interval: 154 ms
Q-T Interval: 396 ms
QRS Duration: 84 ms
QTC Calculation (Bezet): 433 ms
Ventricular Rate: 72 {beats}/min

## 2022-02-14 LAB — CBC WITH AUTO DIFFERENTIAL
Anisocytosis: 2
Hematocrit: 34.3 % — ABNORMAL LOW (ref 35.0–47.0)
Hemoglobin: 9.2 gm/dl — ABNORMAL LOW (ref 11.0–16.0)
Lymphocytes: 5 % — ABNORMAL LOW (ref 28–48)
MCH: 26.9 pg (ref 25.4–34.6)
MCHC: 26.8 gm/dl — ABNORMAL LOW (ref 30.0–36.0)
MCV: 100.3 fL — ABNORMAL HIGH (ref 80.0–98.0)
MPV: 13.1 fL — ABNORMAL HIGH (ref 6.0–10.0)
Macrocytes: 1
Monocytes: 5 % (ref 1–13)
Neutrophils Segmented: 90 % — ABNORMAL HIGH (ref 34–64)
Nucleated RBCs: 0 (ref 0–0)
Platelet Appearance: NORMAL
Platelets: 236 10*3/uL (ref 140–450)
Polychromasia: 2
RBC: 3.42 M/uL — ABNORMAL LOW (ref 3.60–5.20)
RDW: 57.8 — ABNORMAL HIGH (ref 36.4–46.3)
WBC: 25.2 10*3/uL — ABNORMAL HIGH (ref 4.0–11.0)

## 2022-02-14 LAB — URINALYSIS
Bilirubin, Urine: NEGATIVE
Glucose, Ur: NEGATIVE mg/dl
Ketones, Urine: NEGATIVE mg/dl
Nitrite, Urine: NEGATIVE
Protein, Urine: >=300 mg/dl — AB
Specific Gravity, Urine: >=1.03 (ref 1.005–1.030)
Urobilinogen, Urine: 0.2 mg/dl (ref 0.0–1.0)
pH, Urine: 5 (ref 5.0–9.0)

## 2022-02-14 LAB — LEGIONELLA ANTIGEN, URINE: Legionella Urinary Ag: NEGATIVE

## 2022-02-14 LAB — BASIC METABOLIC PANEL
Anion Gap: 7 mmol/L (ref 5–15)
Anion Gap: 6 mmol/L (ref 5–15)
Anion Gap: 5 mmol/L (ref 5–15)
Anion Gap: 2 mmol/L — ABNORMAL LOW (ref 5–15)
BUN: 23 mg/dl (ref 9–23)
BUN: 25 mg/dl — ABNORMAL HIGH (ref 9–23)
BUN: 26 mg/dl — ABNORMAL HIGH (ref 9–23)
BUN: 28 mg/dl — ABNORMAL HIGH (ref 9–23)
CO2: 32 mEq/L — ABNORMAL HIGH (ref 20–31)
CO2: 34 mEq/L — ABNORMAL HIGH (ref 20–31)
CO2: 35 mEq/L — ABNORMAL HIGH (ref 20–31)
CO2: 37 mEq/L — ABNORMAL HIGH (ref 20–31)
Calcium: 8.9 mg/dl (ref 8.7–10.4)
Calcium: 9 mg/dl (ref 8.7–10.4)
Calcium: 9.2 mg/dl (ref 8.7–10.4)
Calcium: 9 mg/dl (ref 8.7–10.4)
Chloride: 97 mEq/L — ABNORMAL LOW (ref 98–107)
Chloride: 96 mEq/L — ABNORMAL LOW (ref 98–107)
Chloride: 99 mEq/L (ref 98–107)
Chloride: 100 mEq/L (ref 98–107)
Creatinine: 0.8 mg/dl (ref 0.55–1.02)
Creatinine: 0.86 mg/dl (ref 0.55–1.02)
Creatinine: 0.76 mg/dl (ref 0.55–1.02)
Creatinine: 0.84 mg/dl (ref 0.55–1.02)
GFR African American: >60
GFR African American: >60
GFR African American: >60
GFR African American: >60
GFR Non-African American: >60
GFR Non-African American: >60
GFR Non-African American: >60
GFR Non-African American: >60
Glucose: 209 mg/dl — ABNORMAL HIGH (ref 74–106)
Glucose: 205 mg/dl — ABNORMAL HIGH (ref 74–106)
Glucose: 140 mg/dl — ABNORMAL HIGH (ref 74–106)
Glucose: 199 mg/dl — ABNORMAL HIGH (ref 74–106)
Potassium: 6.4 mEq/L (ref 3.5–5.1)
Potassium: 6.4 mEq/L (ref 3.5–5.1)
Potassium: 6.2 mEq/L (ref 3.5–5.1)
Potassium: 6.2 mEq/L (ref 3.5–5.1)
Sodium: 136 mEq/L (ref 136–145)
Sodium: 136 mEq/L (ref 136–145)
Sodium: 139 mEq/L (ref 136–145)
Sodium: 139 mEq/L (ref 136–145)

## 2022-02-14 LAB — POTASSIUM: Potassium: 6.4 mEq/L (ref 3.5–5.1)

## 2022-02-14 LAB — AMMONIA: Ammonia: 19 umol/L (ref 11.2–31.7)

## 2022-02-14 LAB — COMPREHENSIVE METABOLIC PANEL
ALT: 20 U/L (ref 10–49)
AST: 20 U/L (ref 0.0–33.9)
Albumin: 2.5 gm/dl — ABNORMAL LOW (ref 3.4–5.0)
Alkaline Phosphatase: 75 U/L (ref 46–116)
Anion Gap: 2 mmol/L — ABNORMAL LOW (ref 5–15)
BUN: 23 mg/dl (ref 9–23)
CO2: 37 mEq/L — ABNORMAL HIGH (ref 20–31)
Calcium: 9.2 mg/dl (ref 8.7–10.4)
Chloride: 96 mEq/L — ABNORMAL LOW (ref 98–107)
Creatinine: 0.92 mg/dl (ref 0.55–1.02)
GFR African American: >60
GFR Non-African American: >60
Glucose: 292 mg/dl — ABNORMAL HIGH (ref 74–106)
Potassium: 7.2 mEq/L (ref 3.5–5.1)
Sodium: 135 mEq/L — ABNORMAL LOW (ref 136–145)
Total Bilirubin: 0.2 mg/dl — ABNORMAL LOW (ref 0.30–1.20)
Total Protein: 5.7 gm/dl (ref 5.7–8.2)

## 2022-02-14 LAB — POCT GLUCOSE
POC Glucose: 255 mg/dL — ABNORMAL HIGH (ref 65–105)
POC Glucose: 220 mg/dL — ABNORMAL HIGH (ref 65–105)
POC Glucose: 201 mg/dL — ABNORMAL HIGH (ref 65–105)
POC Glucose: 197 mg/dL — ABNORMAL HIGH (ref 65–105)
POC Glucose: 168 mg/dL — ABNORMAL HIGH (ref 65–105)
POC Glucose: 208 mg/dL — ABNORMAL HIGH (ref 65–105)

## 2022-02-14 LAB — URINE DRUG SCREEN
Amphetamine: NEGATIVE
Amphetamine: NEGATIVE
Barbiturates, Urine: NEGATIVE
Barbiturates, Urine: NEGATIVE
Benzodiazepines, Urine: NEGATIVE
Benzodiazepines, Urine: NEGATIVE
Cocaine: NEGATIVE
Cocaine: NEGATIVE
Marijuana: NEGATIVE
Marijuana: NEGATIVE
Methadone Screen, Urine: NEGATIVE
Methadone Screen, Urine: NEGATIVE
Opiates, Urine: NEGATIVE
Opiates, Urine: NEGATIVE
Phencyclidine: NEGATIVE
Phencyclidine: NEGATIVE

## 2022-02-14 LAB — T4, FREE: T4 Free: 1.11 ng/dl (ref 0.89–1.76)

## 2022-02-14 LAB — PROCALCITONIN: Procalcitonin: 0.38 ng/ml (ref 0.00–0.50)

## 2022-02-14 LAB — FIBRINOGEN: Fibrinogen: 509 mg/dl — ABNORMAL HIGH (ref 220–397)

## 2022-02-14 LAB — POC CHEM 8
BUN: 27 mg/dl — ABNORMAL HIGH (ref 7–25)
Calcium, Ionized: 5 mg/dL (ref 4.40–5.40)
Chloride: 92 mEq/L — ABNORMAL LOW (ref 98–107)
Creatinine: 1.3 mg/dl (ref 0.6–1.3)
Glucose: 297 mg/dL — ABNORMAL HIGH (ref 74–106)
Hematocrit: 33 % — ABNORMAL LOW (ref 38–45)
Hemoglobin: 11.2 gm/dl — ABNORMAL LOW (ref 12.4–17.2)
Potassium: 7.2 mEq/L (ref 3.5–4.9)
Sodium: 132 mEq/L — ABNORMAL LOW (ref 136–145)

## 2022-02-14 LAB — MICROSCOPIC URINALYSIS
RBC, UA: >50 /HPF
WBC, UA: >50 /HPF

## 2022-02-14 LAB — TROPONIN
Troponin, High Sensitivity: 6 ng/L (ref 0–34)
Troponin, High Sensitivity: 9 ng/L (ref 0–34)

## 2022-02-14 LAB — STREP PNEUMONIAE ANTIGEN: STREP PNEUMONIAE ANTIGEN, URINE: NEGATIVE

## 2022-02-14 LAB — MRSA BY PCR

## 2022-02-14 LAB — SALICYLATE LEVEL: Salicyclic Acid: <3 mg/dl (ref 2.8–20.0)

## 2022-02-14 LAB — CORTISOL TOTAL: CORTISOL: 33.74 ug/dL — ABNORMAL HIGH (ref 3.44–22.45)

## 2022-02-14 LAB — PROTIME-INR
INR: 1.1 (ref 0.1–1.1)
Protime: 13.3 seconds — ABNORMAL HIGH (ref 10.2–12.9)

## 2022-02-14 LAB — CK: Creatine Phosphokinase: 39 U/L (ref 34–145)

## 2022-02-14 LAB — APTT: aPTT: 31.8 seconds (ref 25.1–36.5)

## 2022-02-14 LAB — TSH: TSH, High Sensitivity: 1.437 u[IU]/mL (ref 0.550–4.780)

## 2022-02-14 LAB — ACETAMINOPHEN LEVEL: Acetaminophen Level: <2 ug/mL — ABNORMAL LOW (ref 10.0–20.0)

## 2022-02-14 LAB — LACTIC ACID
Lactate: 5.4 mmol/L (ref 0.5–2.2)
Lactate: 3.3 mmol/L — ABNORMAL HIGH (ref 0.5–2.2)
Lactate: 1.7 mmol/L (ref 0.5–2.2)

## 2022-02-14 MED ORDER — MAGNESIUM SULFATE 2000 MG/50 ML IVPB PREMIX
2 GM/50ML | Freq: Once | INTRAVENOUS | Status: AC
Start: 2022-02-14 — End: 2022-02-14
  Administered 2022-02-14: 07:00:00 2000 mg via INTRAVENOUS

## 2022-02-14 MED ORDER — GLUCAGON HCL RDNA (DIAGNOSTIC) 1 MG IJ SOLR
1 MG | INTRAMUSCULAR | Status: AC | PRN
Start: 2022-02-14 — End: ?

## 2022-02-14 MED ORDER — SODIUM CHLORIDE 0.9 % IV BOLUS
0.9 % | Freq: Once | INTRAVENOUS | Status: AC
Start: 2022-02-14 — End: 2022-02-14
  Administered 2022-02-14: 04:00:00 1000 mL via INTRAVENOUS

## 2022-02-14 MED ORDER — NORMAL SALINE FLUSH 0.9 % IV SOLN
0.9 % | Freq: Two times a day (BID) | INTRAVENOUS | Status: AC
Start: 2022-02-14 — End: 2022-02-21
  Administered 2022-02-14 – 2022-02-21 (×14): 10 mL via INTRAVENOUS

## 2022-02-14 MED ORDER — PHENYLEPHRINE HCL-NACL 50-0.9 MG/250ML-% IV SOLN
INTRAVENOUS | Status: AC
Start: 2022-02-14 — End: 2022-02-15

## 2022-02-14 MED ORDER — METHYLPREDNISOLONE NA SUC (PF) 40 MG IJ SOLR
40 MG | Freq: Two times a day (BID) | INTRAMUSCULAR | Status: DC
Start: 2022-02-14 — End: 2022-02-14

## 2022-02-14 MED ORDER — CALCIUM GLUCONATE-NACL 1-0.675 GM/50ML-% IV SOLN
Freq: Once | INTRAVENOUS | Status: AC
Start: 2022-02-14 — End: 2022-02-14
  Administered 2022-02-14: 11:00:00 1000 mg via INTRAVENOUS

## 2022-02-14 MED ORDER — ETOMIDATE 2 MG/ML IV SOLN
2 MG/ML | INTRAVENOUS | Status: AC
Start: 2022-02-14 — End: 2022-02-13
  Administered 2022-02-14: 04:00:00 20 mg via INTRAVENOUS

## 2022-02-14 MED ORDER — ACETAMINOPHEN 650 MG RE SUPP
650 | Freq: Four times a day (QID) | RECTAL | Status: DC | PRN
Start: 2022-02-14 — End: 2022-02-26

## 2022-02-14 MED ORDER — SODIUM BICARBONATE 8.4 % IV SOLN
8.4 % | Freq: Once | INTRAVENOUS | Status: DC
Start: 2022-02-14 — End: 2022-02-14

## 2022-02-14 MED ORDER — ONDANSETRON HCL 4 MG/2ML IJ SOLN
4 MG/2ML | Freq: Four times a day (QID) | INTRAMUSCULAR | Status: AC | PRN
Start: 2022-02-14 — End: ?
  Administered 2022-02-16: 20:00:00 4 mg via INTRAVENOUS

## 2022-02-14 MED ORDER — SODIUM CHLORIDE 0.9 % IV BOLUS
0.9 % | Freq: Once | INTRAVENOUS | Status: AC
Start: 2022-02-14 — End: 2022-02-14
  Administered 2022-02-14: 23:00:00 500 mL via INTRAVENOUS

## 2022-02-14 MED ORDER — GLUCAGON HCL RDNA (DIAGNOSTIC) 1 MG IJ SOLR
1 MG | INTRAMUSCULAR | Status: DC | PRN
Start: 2022-02-14 — End: 2022-02-14

## 2022-02-14 MED ORDER — METHYLPREDNISOLONE NA SUC (PF) 40 MG IJ SOLR
40 MG | Freq: Three times a day (TID) | INTRAMUSCULAR | Status: AC
Start: 2022-02-14 — End: 2022-02-15
  Administered 2022-02-14 – 2022-02-15 (×3): 40 mg via INTRAVENOUS

## 2022-02-14 MED ORDER — ALBUTEROL SULFATE (2.5 MG/3ML) 0.083% IN NEBU
RESPIRATORY_TRACT | Status: AC
Start: 2022-02-14 — End: 2022-02-14
  Administered 2022-02-14: 06:00:00 5 mg via RESPIRATORY_TRACT

## 2022-02-14 MED ORDER — ACETYLCYSTEINE 10 % IN SOLN
10 % | Freq: Three times a day (TID) | RESPIRATORY_TRACT | Status: AC
Start: 2022-02-14 — End: 2022-02-16
  Administered 2022-02-15 – 2022-02-16 (×6): 400 mL via RESPIRATORY_TRACT

## 2022-02-14 MED ORDER — VASOPRESSIN 0.2 UNIT/ML IV SOLN
0.2 UNIT/ML | INTRAVENOUS | Status: AC
Start: 2022-02-14 — End: 2022-02-15
  Administered 2022-02-14: 14:00:00 0.03 [IU]/min via INTRAVENOUS

## 2022-02-14 MED ORDER — VANCOMYCIN INTERMITTENT DOSING (PLACEHOLDER)
Freq: Once | INTRAVENOUS | Status: AC
Start: 2022-02-14 — End: 2022-02-15

## 2022-02-14 MED ORDER — POLYETHYLENE GLYCOL 3350 17 G PO PACK
17 g | Freq: Every day | ORAL | Status: AC | PRN
Start: 2022-02-14 — End: ?

## 2022-02-14 MED ORDER — NOREPINEPHRINE-DEXTROSE 16-5 MG/250ML-% IV SOLN
16-5 MG/250ML-% | INTRAVENOUS | Status: AC
Start: 2022-02-14 — End: 2022-02-15
  Administered 2022-02-14: 06:00:00 20 ug/min via INTRAVENOUS
  Administered 2022-02-14: 05:00:00 25 ug/min via INTRAVENOUS

## 2022-02-14 MED ORDER — DEXTROSE 50 % IV SOLN
50 % | INTRAVENOUS | Status: DC | PRN
Start: 2022-02-14 — End: 2022-02-14

## 2022-02-14 MED ORDER — CALCIUM GLUCONATE 10 % IV SOLN
10 % | Freq: Once | INTRAVENOUS | Status: DC
Start: 2022-02-14 — End: 2022-02-14

## 2022-02-14 MED ORDER — INSULIN REGULAR HUMAN 100 UNIT/ML IJ SOLN
100 UNIT/ML | Freq: Once | INTRAMUSCULAR | Status: AC
Start: 2022-02-14 — End: 2022-02-14
  Administered 2022-02-14: 20:00:00 10 [IU] via INTRAVENOUS

## 2022-02-14 MED ORDER — DEXTROSE 50 % IV SOLN
50 % | INTRAVENOUS | Status: AC | PRN
Start: 2022-02-14 — End: ?

## 2022-02-14 MED ORDER — ACETAMINOPHEN 325 MG PO TABS
325 | Freq: Four times a day (QID) | ORAL | Status: DC | PRN
Start: 2022-02-14 — End: 2022-02-26
  Administered 2022-02-14 – 2022-02-18 (×3): 650 mg via ORAL

## 2022-02-14 MED ORDER — SODIUM CHLORIDE 0.9 % IV SOLN (MINI-BAG)
0.9 % | Freq: Once | INTRAVENOUS | Status: AC
Start: 2022-02-14 — End: 2022-02-14
  Administered 2022-02-14: 06:00:00 4500 mg via INTRAVENOUS

## 2022-02-14 MED ORDER — SODIUM CHLORIDE 0.9 % IV SOLN (MINI-BAG)
0.9 % | Freq: Three times a day (TID) | INTRAVENOUS | Status: DC
Start: 2022-02-14 — End: 2022-02-26
  Administered 2022-02-14 – 2022-02-26 (×37): 3375 mg via INTRAVENOUS

## 2022-02-14 MED ORDER — INSULIN LISPRO 100 UNIT/ML IJ SOLN
100 UNIT/ML | Freq: Four times a day (QID) | INTRAMUSCULAR | Status: AC
Start: 2022-02-14 — End: 2022-02-16
  Administered 2022-02-14 – 2022-02-15 (×3): 1 [IU] via SUBCUTANEOUS

## 2022-02-14 MED ORDER — BUDESONIDE 0.5 MG/2ML IN SUSP
0.5 MG/2ML | Freq: Two times a day (BID) | RESPIRATORY_TRACT | Status: AC
Start: 2022-02-14 — End: 2022-02-16
  Administered 2022-02-15 – 2022-02-16 (×4): 500 mg via RESPIRATORY_TRACT

## 2022-02-14 MED ORDER — DEXTROSE 50 % IV SOLN
50 % | Freq: Once | INTRAVENOUS | Status: AC
Start: 2022-02-14 — End: 2022-02-14
  Administered 2022-02-14: 11:00:00 25 g via INTRAVENOUS

## 2022-02-14 MED ORDER — VANCOMYCIN INTERMITTENT DOSING (PLACEHOLDER)
INTRAVENOUS | Status: AC
Start: 2022-02-14 — End: 2022-02-16

## 2022-02-14 MED ORDER — DEXTROSE 50 % IV SOLN
50 % | Freq: Once | INTRAVENOUS | Status: AC
Start: 2022-02-14 — End: 2022-02-14
  Administered 2022-02-14: 20:00:00 25 g via INTRAVENOUS

## 2022-02-14 MED ORDER — ALBUMIN HUMAN 25 % IV SOLN
25 % | Freq: Four times a day (QID) | INTRAVENOUS | Status: AC
Start: 2022-02-14 — End: 2022-02-15
  Administered 2022-02-14 – 2022-02-15 (×3): 25 g via INTRAVENOUS

## 2022-02-14 MED ORDER — ONDANSETRON 4 MG PO TBDP
4 MG | Freq: Three times a day (TID) | ORAL | Status: AC | PRN
Start: 2022-02-14 — End: ?

## 2022-02-14 MED ORDER — SODIUM CHLORIDE (PF) 0.9 % IJ SOLN
0.9 % | Freq: Every day | INTRAMUSCULAR | Status: AC
Start: 2022-02-14 — End: 2022-02-19
  Administered 2022-02-14 – 2022-02-19 (×6): 40 mg via INTRAVENOUS

## 2022-02-14 MED ORDER — FENTANYL CITRATE (PF) 100 MCG/2ML IJ SOLN
100 MCG/2ML | INTRAMUSCULAR | Status: DC
Start: 2022-02-14 — End: 2022-02-14

## 2022-02-14 MED ORDER — IPRATROPIUM-ALBUTEROL 0.5-2.5 (3) MG/3ML IN SOLN
RESPIRATORY_TRACT | Status: AC | PRN
Start: 2022-02-14 — End: 2022-02-18
  Administered 2022-02-16: 06:00:00 1 via RESPIRATORY_TRACT

## 2022-02-14 MED ORDER — INSULIN GLARGINE 100 UNIT/ML SC SOLN
100 UNIT/ML | Freq: Every evening | SUBCUTANEOUS | Status: AC
Start: 2022-02-14 — End: 2022-02-26
  Administered 2022-02-15 – 2022-02-19 (×5): 15 [IU] via SUBCUTANEOUS
  Administered 2022-02-20 – 2022-02-21 (×2): 18 [IU] via SUBCUTANEOUS
  Administered 2022-02-22 – 2022-02-23 (×2): 20 [IU] via SUBCUTANEOUS
  Administered 2022-02-24 – 2022-02-26 (×3): 18 [IU] via SUBCUTANEOUS

## 2022-02-14 MED ORDER — MONTELUKAST SODIUM 10 MG PO TABS
10 MG | Freq: Every day | ORAL | Status: AC
Start: 2022-02-14 — End: ?
  Administered 2022-02-14 – 2022-02-26 (×12): 10 mg via ORAL

## 2022-02-14 MED ORDER — MIDAZOLAM-SODIUM CHLORIDE 100-0.9 MG/100ML-% IV SOLN
INTRAVENOUS | Status: AC
Start: 2022-02-14 — End: 2022-02-15
  Administered 2022-02-14: 06:00:00 2 mg/h via INTRAVENOUS

## 2022-02-14 MED ORDER — IOPAMIDOL 76 % IV SOLN
76 % | Freq: Once | INTRAVENOUS | Status: AC | PRN
Start: 2022-02-14 — End: 2022-02-14
  Administered 2022-02-14: 08:00:00 85 mL via INTRAVENOUS

## 2022-02-14 MED ORDER — PATIROMER SORBITEX CALCIUM 8.4 G PO PACK
8.4 g | Freq: Once | ORAL | Status: AC
Start: 2022-02-14 — End: 2022-02-14
  Administered 2022-02-14: 20:00:00 8.4 g via ORAL

## 2022-02-14 MED ORDER — ROCURONIUM BROMIDE 50 MG/5ML IV SOLN
50 MG/5ML | INTRAVENOUS | Status: AC
Start: 2022-02-14 — End: 2022-02-13
  Administered 2022-02-14: 04:00:00 100 mg via INTRAVENOUS

## 2022-02-14 MED ORDER — VANCOMYCIN 1250 MG IN NS 250 ML (PREMIX) IVPB
Freq: Two times a day (BID) | Status: AC
Start: 2022-02-14 — End: 2022-02-15
  Administered 2022-02-14 – 2022-02-15 (×3): 1250 mg via INTRAVENOUS

## 2022-02-14 MED ORDER — NALOXONE HCL 2 MG/2ML IJ SOSY
2 MG/ML | INTRAMUSCULAR | Status: AC
Start: 2022-02-14 — End: 2022-02-14
  Administered 2022-02-14: 04:00:00 2 mg via INTRAVENOUS

## 2022-02-14 MED ORDER — ROSUVASTATIN CALCIUM 10 MG PO TABS
10 MG | Freq: Every evening | ORAL | Status: AC
Start: 2022-02-14 — End: ?
  Administered 2022-02-15 – 2022-02-26 (×12): 5 mg via ORAL

## 2022-02-14 MED ORDER — FLUTICASONE FUROATE-VILANTEROL 100-25 MCG/ACT IN AEPB
100-25 MCG/ACT | Freq: Every day | RESPIRATORY_TRACT | Status: AC
Start: 2022-02-14 — End: ?
  Administered 2022-02-17 – 2022-02-26 (×8): 1 via RESPIRATORY_TRACT

## 2022-02-14 MED ORDER — SODIUM CHLORIDE 0.9 % IV SOLN
0.9 % | INTRAVENOUS | Status: AC | PRN
Start: 2022-02-14 — End: 2022-02-26

## 2022-02-14 MED ORDER — VANCOMYCIN 2000 MG IN NS 500 ML (PREMIX) IVPB
Freq: Once | Status: AC
Start: 2022-02-14 — End: 2022-02-14
  Administered 2022-02-14: 06:00:00 2000 mg via INTRAVENOUS

## 2022-02-14 MED ORDER — DOXYCYCLINE HYCLATE 100 MG IV SOLR
100 MG | Freq: Two times a day (BID) | INTRAVENOUS | Status: AC
Start: 2022-02-14 — End: 2022-02-21
  Administered 2022-02-14 – 2022-02-21 (×16): 100 mg via INTRAVENOUS

## 2022-02-14 MED ORDER — NOREPINEPHRINE-DEXTROSE 16-5 MG/250ML-% IV SOLN
16-5 MG/250ML-% | INTRAVENOUS | Status: AC
Start: 2022-02-14 — End: 2022-02-14
  Administered 2022-02-14: 04:00:00 10 via INTRAVENOUS

## 2022-02-14 MED ORDER — INSULIN REGULAR HUMAN 100 UNIT/ML IJ SOLN
100 UNIT/ML | Freq: Once | INTRAMUSCULAR | Status: AC
Start: 2022-02-14 — End: 2022-02-14
  Administered 2022-02-14: 11:00:00 10 [IU] via INTRAVENOUS

## 2022-02-14 MED ORDER — FENTANYL CITRATE-NACL 1-0.9 MG/100ML-% IV SOLN
INTRAVENOUS | Status: AC
Start: 2022-02-14 — End: 2022-02-15
  Administered 2022-02-14 (×3): 100 ug/h via INTRAVENOUS
  Administered 2022-02-14: 06:00:00 50 ug/h via INTRAVENOUS
  Administered 2022-02-15: 07:00:00 125 ug/h via INTRAVENOUS
  Administered 2022-02-15: 09:00:00 100 ug/h via INTRAVENOUS

## 2022-02-14 MED ORDER — METHYLPREDNISOLONE SODIUM SUCC 125 MG IJ SOLR (MIXTURES ONLY)
125 MG | INTRAMUSCULAR | Status: AC
Start: 2022-02-14 — End: 2022-02-14
  Administered 2022-02-14: 06:00:00 125 mg via INTRAVENOUS

## 2022-02-14 MED ORDER — IPRATROPIUM-ALBUTEROL 0.5-2.5 (3) MG/3ML IN SOLN
Freq: Three times a day (TID) | RESPIRATORY_TRACT | Status: AC
Start: 2022-02-14 — End: 2022-02-21
  Administered 2022-02-15 – 2022-02-21 (×18): 1 via RESPIRATORY_TRACT

## 2022-02-14 MED ORDER — NORMAL SALINE FLUSH 0.9 % IV SOLN
0.9 % | INTRAVENOUS | Status: AC | PRN
Start: 2022-02-14 — End: 2022-02-26
  Administered 2022-02-15: 14:00:00 20 mL via INTRAVENOUS

## 2022-02-14 MED FILL — VASOSTRICT 0.2 UNIT/ML IV SOLN: 0.2 UNIT/ML | INTRAVENOUS | Qty: 100

## 2022-02-14 MED FILL — VANCOMYCIN 1250 MG IN NS 250 ML (PREMIX) IVPB: Qty: 250

## 2022-02-14 MED FILL — PIPERACILLIN SOD-TAZOBACTAM SO 4.5 (4-0.5) G IV SOLR: 4.5 (4-0.5) g | INTRAVENOUS | Qty: 4500

## 2022-02-14 MED FILL — SODIUM CHLORIDE FLUSH 0.9 % IV SOLN: 0.9 % | INTRAVENOUS | Qty: 10

## 2022-02-14 MED FILL — SOLU-MEDROL (PF) 125 MG IJ SOLR: 125 MG | INTRAMUSCULAR | Qty: 125

## 2022-02-14 MED FILL — ALBUTEROL SULFATE (2.5 MG/3ML) 0.083% IN NEBU: RESPIRATORY_TRACT | Qty: 6

## 2022-02-14 MED FILL — MONTELUKAST SODIUM 10 MG PO TABS: 10 MG | ORAL | Qty: 1

## 2022-02-14 MED FILL — FENTANYL CITRATE-NACL 1-0.9 MG/100ML-% IV SOLN: INTRAVENOUS | Qty: 100

## 2022-02-14 MED FILL — NOREPINEPHRINE-DEXTROSE 16-5 MG/250ML-% IV SOLN: 16-5 MG/250ML-% | INTRAVENOUS | Qty: 250

## 2022-02-14 MED FILL — ALBUKED 25 25 % IV SOLN: 25 % | INTRAVENOUS | Qty: 100

## 2022-02-14 MED FILL — MIDAZOLAM-SODIUM CHLORIDE 100-0.9 MG/100ML-% IV SOLN: INTRAVENOUS | Qty: 100

## 2022-02-14 MED FILL — ADMELOG 100 UNIT/ML IJ SOLN: 100 UNIT/ML | INTRAMUSCULAR | Qty: 1

## 2022-02-14 MED FILL — FENTANYL CITRATE (PF) 100 MCG/2ML IJ SOLN: 100 MCG/2ML | INTRAMUSCULAR | Qty: 2

## 2022-02-14 MED FILL — VANCOMYCIN INTERMITTENT DOSING (PLACEHOLDER): INTRAVENOUS | Qty: 1

## 2022-02-14 MED FILL — DOXY 100 100 MG IV SOLR: 100 MG | INTRAVENOUS | Qty: 100

## 2022-02-14 MED FILL — HUMULIN R 100 UNIT/ML IJ SOLN: 100 UNIT/ML | INTRAMUSCULAR | Qty: 1

## 2022-02-14 MED FILL — DEXTROSE 50 % IV SOLN: 50 % | INTRAVENOUS | Qty: 50

## 2022-02-14 MED FILL — MAGNESIUM SULFATE 2 GM/50ML IV SOLN: 2 GM/50ML | INTRAVENOUS | Qty: 50

## 2022-02-14 MED FILL — PIPERACILLIN SOD-TAZOBACTAM SO 3.375 (3-0.375) G IV SOLR: 3.375 (3-0.375) g | INTRAVENOUS | Qty: 3375

## 2022-02-14 MED FILL — ACETAMINOPHEN 325 MG PO TABS: 325 MG | ORAL | Qty: 2

## 2022-02-14 MED FILL — SOLU-MEDROL (PF) 40 MG IJ SOLR: 40 MG | INTRAMUSCULAR | Qty: 40

## 2022-02-14 MED FILL — BREO ELLIPTA 100-25 MCG/ACT IN AEPB: 100-25 MCG/ACT | RESPIRATORY_TRACT | Qty: 28

## 2022-02-14 MED FILL — SODIUM CHLORIDE 0.9 % IV SOLN: 0.9 % | INTRAVENOUS | Qty: 500

## 2022-02-14 MED FILL — PANTOPRAZOLE SODIUM 40 MG IV SOLR: 40 MG | INTRAVENOUS | Qty: 40

## 2022-02-14 MED FILL — VANCOMYCIN 2000 MG IN NS 500 ML (PREMIX) IVPB: Qty: 500

## 2022-02-14 MED FILL — ISOVUE-370 76 % IV SOLN: 76 % | INTRAVENOUS | Qty: 85

## 2022-02-14 MED FILL — VELTASSA 8.4 G PO PACK: 8.4 g | ORAL | Qty: 1

## 2022-02-14 MED FILL — CALCIUM GLUCONATE-NACL 1-0.675 GM/50ML-% IV SOLN: INTRAVENOUS | Qty: 50
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
## 2022-02-14 NOTE — Progress Notes (Signed)
Vancomycin Note    Admission date [id number]   Attending CHIN, JOHN C   Indication sepsis       Height Ht Readings from Last 1 Encounters:   02/14/22 5\' 7"  (1.702 m)       Weight Wt Readings from Last 1 Encounters:   02/14/22 205 lb 4 oz (93.1 kg)      IBW Ideal body weight: 61.6 kg (135 lb 12.9 oz)    SCr Creatinine   Date Value Ref Range Status   02/14/2022 0.80 0.55 - 1.02 mg/dl Final          Load/ER dose 2000 mg Iv x 1   Current regimen 1250mg  IV q12h   Trough Scheduled for 02-15-22 @ 1300         Predicted Pharmacokinetic Parameters  Vancomycin 1250 mg IV Q12hr (infused over 1.5 hr)  AUC/MIC 551 mcg*hr/mL  (goal 400 to 600 mcg*hr/mL)  Peak 29.4 mcg/mL  Trough 17.4 mcg/mL      Submitted by: , [facility]

## 2022-02-14 NOTE — ED Notes (Signed)
Pt awake, eyes blinking and moving upper extremities. MD made aware.      Lavinia Sharps, RN  02/14/22 [phone]

## 2022-02-14 NOTE — Progress Notes (Signed)
Daily Ventilator Progress Note      Name:  [name] [name]  Gender:  female    DOB:  April 21, 1951 Age:  71 y.o.    MRN:  [id number]  Admit Date:  02/13/2022      Primary Encounter Diagnosis:       The primary encounter diagnosis was Acute respiratory failure with hypoxia and hypercapnia (HCC). Diagnoses of Septic shock (HCC), Acute hyperkalemia, Hyperglycemia, and Community acquired pneumonia of left lung, unspecified part of lung were also pertinent to this visit.     Active Problem List:     Patient Active Problem List   Diagnosis    Obtunded    Narcotic bowel syndrome (HCC)    Hyperkalemia    Accelerated hypertension    UTI (urinary tract infection)    Leukocytosis    Acute hyperglycemia    Uncontrolled type 2 diabetes mellitus with hyperglycemia (HCC)    Sepsis (HCC)    Lumbar and sacral osteoarthritis    Acute colitis    Severe hypertension    Acute-on-chronic kidney injury (HCC)    COPD with acute exacerbation (HCC)    Lactic acidosis    Severe headache    Abdominal pain    Septic shock (HCC)    Type 2 diabetes mellitus with diabetic neuropathy (HCC)    Metabolic encephalopathy    Chronic respiratory failure with hypoxia (HCC)    Gastritis    COPD exacerbation (HCC)    Osteoarthritis of hips, bilateral    Asthma with acute exacerbation    Chronic pain syndrome    Headache    Acute hypoxemic respiratory failure (HCC)    Cannabinoid hyperemesis syndrome    PTSD (post-traumatic stress disorder)    Dyspnea    Type 2 diabetes mellitus with nephropathy (HCC)    Spondylosis of lumbar region without myelopathy or radiculopathy    SIRS (systemic inflammatory response syndrome) (HCC)    Nausea & vomiting    Asthma exacerbation    Dehydration    Sacroiliitis (HCC)    Acute renal failure (ARF) (HCC)    Chest pain    Acute CHF (congestive heart failure) (HCC)    Respiratory failure (HCC)    Acute respiratory failure with hypoxia and hypercapnia (HCC)             LOS: 0 days     ICU LOS:  12h     Vital signs in last 12 hours:    Patient Vitals for the past 12 hrs:   Temp Pulse Resp BP SpO2   02/14/22 1500 (!) 100.6 F (38.1 C) -- -- -- --   02/14/22 1330 (!) 101.8 F (38.8 C) 77 22 105/78 100 %   02/14/22 1310 (!) 100.9 F (38.3 C) -- -- -- --   02/14/22 1300 (!) 102 F (38.9 C) 83 22 101/78 100 %   02/14/22 1230 (!) 102.2 F (39 C) 83 22 90/66 100 %   02/14/22 1222 (!) 101.3 F (38.5 C) -- -- -- --   02/14/22 1210 (!) 102.2 F (39 C) 81 22 93/69 100 %   02/14/22 1208 -- 84 22 -- 100 %   02/14/22 1150 (!) 102 F (38.9 C) 89 22 125/86 100 %   02/14/22 1130 (!) 102 F (38.9 C) 91 22 105/75 100 %   02/14/22 1115 -- 94 22 102/77 100 %   02/14/22 1101 (!) 101.7 F (38.7 C) 95 22 97/66 100 %   02/14/22 1100 (!) 101.7  F (38.7 C) 94 22 97/66 100 %   02/14/22 1045 (!) 101.7 F (38.7 C) 89 (!) 1 116/89 96 %   02/14/22 1030 -- 97 22 98/69 100 %   02/14/22 1015 -- 99 27 (!) 83/63 100 %   02/14/22 1000 -- 99 22 (!) 86/62 99 %   02/14/22 0950 (!) 101 F (38.3 C) -- -- -- --   02/14/22 0945 (!) 100.8 F (38.2 C) 98 22 (!) 88/62 98 %   02/14/22 0930 (!) 100.6 F (38.1 C) 99 22 (!) 83/55 100 %   02/14/22 0915 100.4 F (38 C) 98 22 (!) 86/66 100 %   02/14/22 0900 100.2 F (37.9 C) 98 22 96/66 100 %   02/14/22 0845 100.2 F (37.9 C) 95 22 95/68 100 %   02/14/22 0830 100 F (37.8 C) 96 22 97/67 100 %   02/14/22 0820 100 F (37.8 C) 97 24 93/66 100 %   02/14/22 0810 99.9 F (37.7 C) 90 (!) 0 (!) 77/55 100 %   02/14/22 0800 99.7 F (37.6 C) 89 (!) 0 (!) 78/56 100 %   02/14/22 0745 99.5 F (37.5 C) 86 (!) 0 (!) 90/59 100 %   02/14/22 0730 99.3 F (37.4 C) 86 (!) 0 91/60 100 %   02/14/22 0715 (!) 95 F (35 C) 86 (!) 9 99/71 100 %   02/14/22 0700 (!) 93.7 F (34.3 C) 85 15 96/66 100 %   02/14/22 0645 (!) 93.7 F (34.3 C) 83 (!) 3 102/75 100 %   02/14/22 0630 (!) 93.9 F (34.4 C) 81 (!) 0 101/69 100 %   02/14/22 0615 (!) 94.1 F (34.5 C) 80 (!) 1 105/65 100 %   02/14/22 0600 -- 79 (!) 6 117/79 100 %   02/14/22 0545 -- 76 13 127/87 100  %   02/14/22 0530 -- 75 14 116/85 100 %   02/14/22 0515 98.6 F (37 C) 74 13 112/70 97 %          ABG & LACTATE Results (Last 7):     Recent Labs     02/14/22  1639 02/14/22  0509 02/14/22  0205 02/14/22  0042   PH 7.427 7.388 7.190* 7.033*   PCO2 56.6* 65.2* 102.3* >130.0   PO2 109.0* 53.0* 72.0* 175.0*   HCO3 37.3* 39.3* 39.1* <>   BE 13* 14* 11* <>   O2SAT 98.0 85.0* 88.0* <>   DRAWSITE L Radial L Radial L Radial L Radial   SMAPLE Art Art Art Art   FIO2 50 40 50 70   DS Vent Vent Vent Vent   MODE P-CMV P-CMV P-CMV P-CMV       Recent Labs     02/14/22  1639 02/14/22  1106 02/14/22  0509 02/14/22  0506 02/14/22  0205 02/14/22  0042 02/13/22  2345   LACTATE 1.15 1.7 1.23 3.3* 2.20* 3.81* 5.4*        ARDS is classified according to the degree of hypoxemia (PaO2/FiO2 ratio),  mild (PaO2/FiO2, 201-300), moderate (PaO2/FiO2, 101-200), and severe (PaO2/FiO2???100)       Airway:     ETT  (Active)   Placement Date/Time: 02/13/22 2350   Present on Admission/Arrival: No  Placed By: (c) In ED  Placement Verified By: Colorimetric ETCO2 device  Preoxygenation: Yes  Mask Ventilation: Ventilated by mask (1)  Airway Tube Size: 7.5 mm  Location: Oral  Ins...   Number of days: 0  ETT  (Active)   Secured At 23 cm 02/14/22 1529   Measured From Lips 02/14/22 1529   ETT Placement Center 02/14/22 1529   Secured By Commercial tube holder 02/14/22 1529   Site Assessment Dry 02/14/22 1529   Tie/Holder Changed No 02/14/22 1529         Ventilator Settings:   Vent Mode: AC/PC  Pressure Ordered: (S) 26  Resp Rate (Set): 22 bmp  PEEP/CPAP (cmH2O): (S) 5  FiO2 : (S) 50 %  Insp Time (sec): 0.68 sec    Vent Patient Data (Readings)  Vt (Measured): 262 mL  Peak Inspiratory Pressure (cmH2O): 32 cmH2O  Rate Measured: 22 br/min  Minute Volume (L/min): 5.7 Liters  Peak Inspiratory Flow (lpm): 33.1 L/sec  Peak Expiratory Flow (lpm): 37.1 L/min  Mean Airway Pressure (cmH2O): 11 cmH20  Plateau Pressure (cm H2O): 18 cm H2O  Driving Pressure:  13  Inspiratory Time: 0.65 sec  I:E Ratio: 1:3.2  Flow Sensitivity: 3 L/min  PEEP Intrinsic (cm H2O): 0.2 cm H2O  Static Compliance (L/cm H2O): 33  Airway Resistance: 23  Tube Compensation: 100 %  Backup Apnea: On  Backup Rate: 22 Breaths Per Minute     Vent Alarm Settings  Low Pressure (cmH2O): 5 cmH2O  High Pressure (cmH2O): 50 cmH2O  Low Minute Volume (lpm): 2 L/min  High Minute Volume (lpm): 20 L/min  Low Exhaled Vt (ml): 200 mL  High Exhaled Vt (ml): 1000 mL  RR Low (bpm): 8  RR High (bpm): 40 br/min  Apnea (secs): 20 secs      Breath Sounds:   Breath Sounds Bilateral: Diminished  Right Upper Lobe: Diminished  Right Middle Lobe: Diminished  Right Lower Lobe: Diminished  Left Upper Lobe: Diminished  Left Lower Lobe: Diminished      Secretions:     Suction: ET Tube  Sputum Method Obtained: Endotracheal  Sputum Amount: Small  Sputum Color/Odor: Shelva Majestic  Sputum Consistency: Thick      Imaging:   XR CHEST PORTABLE 02/14/2022    Narrative  EXAM: XR CHEST PORTABLE  INDICATION: central line  COMPARISON: 2023  WORKSTATION ID: [id number]    FINDINGS:    SUPPORT DEVICES: Stable position of endotracheal and enteric tube partially  imaged. Right central venous catheter terminates at the mid SVC.    LUNGS/PLEURA: No pneumothorax. Stable lung findings.    HEART/MEDIASTINUM: Cardiomediastinal silhouette is normal.    OTHER: None.    Impression  IMPRESSION: No pneumothorax. Stable lung findings.    Electronically signed by: Albin Felling, MD 02/14/2022 1:25 AM EDT     XR CHEST 1 VIEW 05/18/2021    Narrative  Chest    Indication: SOB  Comparison: 05/17/2021    Findings:    AP portable upright view of the chest demonstrates that the cardiac silhouette  is not  enlarged. Stable left upper lobe bulla. Interval worsening of right  lower lobe airspace disease. Stable left mid and basilar airspace disease.    Impression  Impression:    Worsening right lower lobe airspace disease. Stable left lung airspace disease    Electronically  signed by: Jodi Mourning, MD 05/18/2021 9:42 AM EST    No results found for this or any previous visit from the past 365 days.    No results found for this or any previous visit from the past 365 days.    CT Chest Pulmonary Embolism W Contrast 02/14/2022    Narrative  EXAM:  CT CHEST PULMONARY EMBOLISM W CONTRAST  INDICATION: Chest Pain    COMPARISON: 2023    TECHNIQUE: Multiplanar CT angiography of the chest was performed with  intravenous contrast. 3D MIP reformats were performed.    All CT exams at this facility use one or more dose reduction techniques  including automatic exposure control, mA/kV adjustment per patient's size, or  iterative reconstruction technique.  WORKSTATION ID: [id number]    FINDINGS:    SUPPORT DEVICES: Endotracheal tube and enteric tube in good position.    CONTRAST BOLUS: Satisfactory.    PULMONARY ARTERIES: No  evidence of pulmonary embolism.    LUNGS: Biapical bullae and emphysematous changes. Moderate left pleural effusion  and significant lingular and left lower lobe atelectasis. Lingular atelectasis  appears unchanged. Minimal right lower lobe atelectasis. No pneumonia.    CENTRAL AIRWAYS: Partial compression or occlusion of left lower lobe bronchi.  There is right lower lobe distal Airways thickening noted best appreciated on  axial image 92 series 5. Nonspecific.    PLEURA: See above    MEDIASTINUM AND HILA: No lymphadenopathy.    HEART: Cardiomegaly. No pericardial effusion.    VESSELS: Normal. No dissection or aneurysm.    CHEST WALL: Unremarkable.    BONES: No acute fracture or aggressive osseous lesion.    UPPER ABDOMEN: Unremarkable.    Impression  IMPRESSION:  1. Moderate left pleural effusion and significant left lung atelectasis.  2. No pulmonary embolism.    Electronically signed by: Albin Felling, MD 02/14/2022 5:07 AM EDT         Weaning Trial:      [place] Agitation Sedation Scale (RASS): Drowsy-Not fully alert, but has sustained awakening to voice (eye opening &  contact >10 sec)      Weaning Parameters:                              Summary:   PC decreased to 26, Peep decreased to 5 and FiO2 increased to 50% per AM Abg and to decrease peak pressures. Peak pressures decreased from 40 to 32. Repeat ABG in AM.         Plan:     Continue to monitor patient on P-CMV on the ventilator      Garey Alleva OBEID, RCP  February 14, 2022

## 2022-02-14 NOTE — ED Notes (Signed)
Pt placed on bare hugger for low body temp.     Lavinia Sharps, RN  02/14/22 [phone]

## 2022-02-14 NOTE — ED Notes (Signed)
Transported pt to CT with RT.      Lavinia Sharps, RN  02/14/22 [phone]

## 2022-02-14 NOTE — Progress Notes (Signed)
Daily Ventilator Progress Note      Name:  [name] [name]  Gender:  female    DOB:  1950/06/30 Age:  71 y.o.    MRN:  [id number]  Admit Date:  02/13/2022      Primary Encounter Diagnosis:       The primary encounter diagnosis was Acute respiratory failure with hypoxia and hypercapnia (HCC). Diagnoses of Septic shock (HCC), Acute hyperkalemia, Hyperglycemia, and Community acquired pneumonia of left lung, unspecified part of lung were also pertinent to this visit.     Active Problem List:     Patient Active Problem List   Diagnosis    Obtunded    Narcotic bowel syndrome (HCC)    Hyperkalemia    Accelerated hypertension    UTI (urinary tract infection)    Leukocytosis    Acute hyperglycemia    Uncontrolled type 2 diabetes mellitus with hyperglycemia (HCC)    Sepsis (HCC)    Lumbar and sacral osteoarthritis    Acute colitis    Severe hypertension    Acute-on-chronic kidney injury (HCC)    COPD with acute exacerbation (HCC)    Lactic acidosis    Severe headache    Abdominal pain    Septic shock (HCC)    Type 2 diabetes mellitus with diabetic neuropathy (HCC)    Metabolic encephalopathy    Chronic respiratory failure with hypoxia (HCC)    Gastritis    COPD exacerbation (HCC)    Osteoarthritis of hips, bilateral    Asthma with acute exacerbation    Chronic pain syndrome    Headache    Acute hypoxemic respiratory failure (HCC)    Cannabinoid hyperemesis syndrome    PTSD (post-traumatic stress disorder)    Dyspnea    Type 2 diabetes mellitus with nephropathy (HCC)    Spondylosis of lumbar region without myelopathy or radiculopathy    SIRS (systemic inflammatory response syndrome) (HCC)    Nausea & vomiting    Asthma exacerbation    Dehydration    Sacroiliitis (HCC)    Acute renal failure (ARF) (HCC)    Chest pain    Acute CHF (congestive heart failure) (HCC)    Respiratory failure (HCC)    Acute respiratory failure with hypoxia and hypercapnia (HCC)             LOS: 0 days     ICU LOS:  40m     Vital signs in last 12 hours:    Patient Vitals for the past 12 hrs:   Temp Pulse Resp BP SpO2   02/14/22 0347 97.5 F (36.4 C) 75 21 115/79 97 %   02/14/22 0322 96.8 F (36 C) 70 19 109/88 --   02/14/22 0320 -- -- -- -- 98 %   02/14/22 0237 (!) 95.5 F (35.3 C) 69 22 118/89 98 %   02/14/22 0226 (!) 95.4 F (35.2 C) 70 16 124/84 --   02/14/22 0206 (!) 95.2 F (35.1 C) 71 18 128/87 100 %   02/14/22 0111 -- 66 17 109/73 98 %   02/14/22 0106 -- 66 16 108/72 98 %   02/14/22 0051 -- 64 16 104/65 --   02/14/22 0046 -- 64 20 101/61 --   02/14/22 0045 -- 63 24 -- 100 %   02/14/22 0044 -- 63 16 100/61 100 %   02/14/22 0021 -- 65 15 (!) 82/53 --   02/14/22 0020 -- 65 16 -- 100 %   02/14/22 0003 -- 68 17 (!) 64/44  100 %   02/14/22 0000 -- 79 24 (!) 74/50 100 %   02/13/22 2345 (!) 93.2 F (34 C) 81 27 108/75 91 %   02/13/22 2344 -- 70 12 (!) 74/49 100 %   02/13/22 2339 -- 50 19 108/75 93 %   02/13/22 2335 (!) 93 F (33.9 C) 50 27 108/75 90 %          ABG & LACTATE Results (Last 7):     Recent Labs     02/14/22  0205 02/14/22  0042   PH 7.190* 7.033*   PCO2 102.3* >130.0   PO2 72.0* 175.0*   HCO3 39.1* <>   BE 11* <>   O2SAT 88.0* <>   DRAWSITE L Radial L Radial   SMAPLE Art Art   FIO2 50 70   DS Vent Vent   MODE P-CMV P-CMV       Recent Labs     02/14/22  0205 02/14/22  0042 02/13/22  2345   LACTATE 2.20* 3.81* 5.4*        ARDS is classified according to the degree of hypoxemia (PaO2/FiO2 ratio),  mild (PaO2/FiO2, 201-300), moderate (PaO2/FiO2, 101-200), and severe (PaO2/FiO2???100)       Airway:     ETT  (Active)   Placement Date/Time: 02/13/22 2350   Present on Admission/Arrival: No  Placed By: (c) In ED  Placement Verified By: Colorimetric ETCO2 device  Preoxygenation: Yes  Mask Ventilation: Ventilated by mask (1)  Airway Tube Size: 7.5 mm  Location: Oral  Ins...   Number of days: 0         ETT  (Active)   Secured At 23 cm 02/14/22 0444   Measured From Lips 02/14/22 0444   ETT Placement Center 02/14/22 0444   Secured By Commercial tube holder  02/14/22 0444   Site Assessment Redness;Other (comment) 02/14/22 0444         Ventilator Settings:   Vent Mode: AC/PC  Pressure Ordered: 30  Resp Rate (Set): 22 bmp  PEEP/CPAP (cmH2O): 10  FiO2 : 40 %  Insp Time (sec): 0.68 sec    Vent Patient Data (Readings)  Vt (Measured): 320 mL  Peak Inspiratory Pressure (cmH2O): 42 cmH2O  Rate Measured: 22 br/min  Minute Volume (L/min): 6.8 Liters  Peak Inspiratory Flow (lpm): 43.4 L/sec  Peak Expiratory Flow (lpm): 41.9 L/min  Mean Airway Pressure (cmH2O): 18 cmH20  Plateau Pressure (cm H2O): 30.1 cm H2O  Driving Pressure: 41.3  Inspiratory Time: 0.68 sec  I:E Ratio: 1:3.0  Flow Sensitivity: 5 L/min  PEEP Intrinsic (cm H2O): 1.5 cm H2O  Static Compliance (L/cm H2O): 32  Airway Resistance: 25  I Time/ I Time %: 25 s  Tube Compensation: 100 %  Backup Apnea: On  Backup Rate: 22 Breaths Per Minute     Vent Alarm Settings  Low Pressure (cmH2O): 5 cmH2O  High Pressure (cmH2O): 50 cmH2O  Low Minute Volume (lpm): 2 L/min  High Minute Volume (lpm): 20 L/min  Low Exhaled Vt (ml): 200 mL  High Exhaled Vt (ml): 1000 mL  RR Low (bpm): 8  RR High (bpm): 40 br/min  Apnea (secs): 20 secs      Breath Sounds:   Breath Sounds Bilateral: Diminished  Right Upper Lobe: Diminished  Right Middle Lobe: Diminished  Right Lower Lobe: Diminished  Left Upper Lobe: Diminished  Left Lower Lobe: Diminished      Secretions:     Suction: ET Tube, Oral  Sputum Method Obtained:  Endotracheal  Sputum Amount: Small  Sputum Color/Odor: White  Sputum Consistency: Thick      Imaging:   XR CHEST PORTABLE 02/14/2022    Narrative  EXAM: XR CHEST PORTABLE  INDICATION: central line  COMPARISON: 2023  WORKSTATION ID: [id number]    FINDINGS:    SUPPORT DEVICES: Stable position of endotracheal and enteric tube partially  imaged. Right central venous catheter terminates at the mid SVC.    LUNGS/PLEURA: No pneumothorax. Stable lung findings.    HEART/MEDIASTINUM: Cardiomediastinal silhouette is normal.    OTHER:  None.    Impression  IMPRESSION: No pneumothorax. Stable lung findings.    Electronically signed by: Albin Felling, MD 02/14/2022 1:25 AM EDT     XR CHEST 1 VIEW 05/18/2021    Narrative  Chest    Indication: SOB  Comparison: 05/17/2021    Findings:    AP portable upright view of the chest demonstrates that the cardiac silhouette  is not  enlarged. Stable left upper lobe bulla. Interval worsening of right  lower lobe airspace disease. Stable left mid and basilar airspace disease.    Impression  Impression:    Worsening right lower lobe airspace disease. Stable left lung airspace disease    Electronically signed by: Jodi Mourning, MD 05/18/2021 9:42 AM EST    No results found for this or any previous visit from the past 365 days.    No results found for this or any previous visit from the past 365 days.    CT CHEST PULMONARY EMBOLISM W CONTRAST 05/11/2021    Narrative  All CT exams at this facility use one or more dose reduction techniques  including automatic exposure control, mA/kV adjustment per patient's size, or  iterative reconstruction technique.    Clinical History: rule out PE.    Examination:  CTA CHEST with contrast. 3 mm spiral scanning was performed from the lung apices  to the upper pole of the kidneys. 3D MIP coronal and sagittal reconstruction  imaging has been performed.    Correlation:  08/30/2018, 02/18/2018 and 07/03/2015    Findings:    Degenerative changes of the spine. Secretions dependent portions of the trachea.  Dense airspace opacity with air bronchograms left upper lobe. There are  bilateral bullae. Dependent atelectasis lung bases.    2 cm left lower lobe nodule slightly increased in size from 08/30/2018, 02/18/2018  and 07/02/2025 ((prior measurement 1.7, 1.9 and 1.3 cm). No dissection thoracic  aorta. Ascending aorta measures 4.7 cm unchanged from prior studies. The  esophagus is not dilated.    No lymph node enlargement in the axilla, mediastinum or hila. No pulmonary  embolism. Visualized portions  of the liver, pancreas, adrenal glands are  unremarkable. 2.3 cm splenic lesion unchanged from 02/18/2018. 1.8 cm midpole  right renal hypodensity, ultrasound characterization suggested.    Impression  IMPRESSION:  1. No pulmonary embolism.  2. Bilateral bullae  3. Left upper lobe pneumonia/volume loss. Radiographic follow-up to resolution  suggested.  4. 2 cm left lower lobe nodule not significantly changed from prior studies.  5. 2.3 cm stable splenic lesion, unchanged from 02/18/2018.  6. 1.8 cm probable cyst midpole right kidney, ultrasound characterization  suggested.    Electronically signed by: Jolayne Panther, MD 05/11/2021 10:11 PM EST         Weaning Trial:      [place] Agitation Sedation Scale (RASS): Agitated-Frequent non-purposeful movement, fights ventilator      Weaning Parameters:  Summary:   Patient came from ED intubated she is alert and awake, no respiratory distress noted at this time, she has a cough with suction, small thick white secretion via ett and oral.         Plan:     Continue to monitor patient on P-CMV on the ventilator      Amyia Lodwick T. Dare Spillman, RT  February 14, 2022
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
## 2022-02-14 NOTE — ED Notes (Signed)
Bare hugger removed      Lavinia Sharps, RN  02/14/22 [phone]

## 2022-02-14 NOTE — ED Notes (Signed)
Foley inserted using sterile tech, with assistance of ED teach     Lavinia Sharps, RN  02/14/22 [phone]

## 2022-02-14 NOTE — ED Notes (Signed)
TRANSFER - OUT REPORT:    Verbal report given to  on ABEL RA being transferred to 4308 for routine progression of patient care       Report consisted of patient's Situation, Background, Assessment and   Recommendations(SBAR).     Information from the following report(s) Nurse Handoff Report was reviewed with the receiving nurse.  Kinder Assessment: Presents to emergency department  because of falls (Syncope, seizure, or loss of consciousness): Yes, Age > 70: No, Altered Mental Status, Intoxication with alcohol or substance confusion (Disorientation, impaired judgment, poor safety awaremess, or inability to follow instructions): Yes, Impaired Mobility: Ambulates or transfers with assistive devices or assistance; Unable to ambulate or transer.: Yes, Nursing Judgement: Yes  Lines:   CVC Triple Lumen 02/14/22 Right Internal jugular (Active)       Peripheral IV 02/13/22 Right Antecubital (Active)      Medications sent with patient from pharmacy: Yes  Patient belongings: Belongings sent to floor    Opportunity for questions and clarification was provided.      Patient transported with:  Monitor, Registered Nurse, and [place], RN  02/14/22 [phone]

## 2022-02-14 NOTE — Progress Notes (Signed)
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   Bicarbonate drip          Trending lactic acid    Glucommander, hypoglycemia protocol   Diet NPO   GI PPX Protonix IV   ACP: CODE STATUS FULL CODE   DVT PPX continue Eliquis holding  Interval History:   admitted    Subjective:     Unable to obtain due to current condition    Review of Systems:      Positive Items Bolded  Constitutional: fevers, chills, fatigue, weight loss   Eyes: blurry vision, visual change  ENT: sore throat, runny nose, epistaxis  Cardiovascular: chest pain, orthopnea, PND, palpitations  Repiratory: cough, wheezing, sputum production  Gastrointestinal: abdominal pain, nausea, vomiting, diarrhea  Genitourinary: dysuria, frequency, polyuria  Musculoskeletal: pain, joint swelling, crepitus  Skin: rash, itching, wounds  Neurological: headache, numbness, tingling, weakness  Psychiatric: depression, anxiety, hallucinations  Endocrine: heat or cold intolerance  Hematologic:  anemia, petechial, easy bruising, easy bleeding  Allergy: runny nose, itchy eyes      Current Inpatient Meds     Current Facility-Administered Medications:     norepinephrine (LEVOPHED) 16 mg in dextrose 5% 250 mL, 1-30 mcg/min, IntraVENous, Continuous, Muhamed Jasarevic, MD, Last Rate: 13.1 mL/hr at 02/14/22 1030, 14 mcg/min at 02/14/22 1030    fluticasone furoate-vilanterol (BREO ELLIPTA) 100-25 MCG/ACT inhaler 1 puff, 1 puff, Inhalation, Daily, Muhamed Jasarevic, MD    montelukast (SINGULAIR) tablet 10 mg, 10 mg, Oral, Daily, Muhamed Jasarevic, MD, 10 mg at 02/14/22 0800    rosuvastatin (CRESTOR) tablet 5 mg, 5 mg, Oral, Nightly, Muhamed Jasarevic, MD    ipratropium 0.5 mg-albuterol 2.5 mg (DUONEB) nebulizer solution 1 Dose, 1 Dose, Inhalation, Q2H PRN, Irving Burton, MD    pantoprazole (PROTONIX) 40 mg in sodium chloride (PF) 0.9 % 10 mL injection, 40 mg, IntraVENous, Daily, Muhamed Jasarevic, MD, 40 mg at 02/14/22 0800    piperacillin-tazobactam (ZOSYN) 3,375 mg in sodium chloride 0.9 % 100 mL IVPB (mini-bag), 3,375 mg, IntraVENous, Q8H, Muhamed Jasarevic, MD, Last Rate: 25 mL/hr at 02/14/22 0800, 3,375 mg at 02/14/22 0800    dextrose 50 % IV solution, 10-50 mL, IntraVENous, PRN, Muhamed Jasarevic, MD    glucagon (rDNA) injection 1 mg, 1 mg, IntraMUSCular, PRN, Muhamed Jasarevic, MD    insulin glargine (LANTUS) injection vial 1-100 Units, 1-100 Units, SubCUTAneous, QHS, Irving Burton, MD    insulin  lispro (HUMALOG) injection vial 1-100 Units, 1-100 Units, SubCUTAneous, 4 times per day, Irving Burton, MD, 1 Units at 02/14/22 0524    sodium chloride flush 0.9 % injection 5-40 mL, 5-40 mL, IntraVENous, 2 times per day, Irving Burton, MD, 10 mL at 02/14/22 0818    sodium chloride flush 0.9 % injection 5-40 mL, 5-40 mL, IntraVENous, PRN, Muhamed Jasarevic, MD    0.9 % sodium chloride infusion, , IntraVENous, PRN, Irving Burton, MD    ondansetron (ZOFRAN-ODT) disintegrating tablet 4 mg, 4 mg,  Oral, Q8H PRN **OR** ondansetron (ZOFRAN) injection 4 mg, 4 mg, IntraVENous, Q6H PRN, Irving Burton, MD    polyethylene glycol (GLYCOLAX) packet 17 g, 17 g, Oral, Daily PRN, Irving Burton, MD    acetaminophen (TYLENOL) tablet 650 mg, 650 mg, Oral, Q6H PRN, 650 mg at 02/14/22 0951 **OR** acetaminophen (TYLENOL) suppository 650 mg, 650 mg, Rectal, Q6H PRN, Irving Burton, MD    midazolam (VERSED) 100mg /136mL in NS infusion, 1-10 mg/hr, IntraVENous, Continuous, 80m, MD, Last Rate: 2 mL/hr at 02/14/22 0807, 2 mg/hr at 02/14/22 0807    fentanyl (SUBLIMAZE) infusion 1000 mcg/178mL, 25-200 mcg/hr, IntraVENous, Continuous, 80m, MD, Last Rate: 10 mL/hr at 02/14/22 0801, 100 mcg/hr at 02/14/22 0801    doxycycline (VIBRAMYCIN) 100 mg in sodium chloride 0.9 % 100 mL IVPB (mini-bag), 100 mg, IntraVENous, Q12H, 04/16/22 Foronda, APRN - NP, Stopped at 02/14/22 0526    methylPREDNISolone sodium (PF) (SOLU-MEDROL PF) injection 40 mg, 40 mg, IntraVENous, Q8H, 04/16/22 Foronda, APRN - NP, 40 mg at 02/14/22 0951    glucagon (rDNA) injection 1 mg, 1 mg, IntraMUSCular, PRN, 04/16/22 Foronda, APRN - NP    dextrose 50 % IV solution, 20-30 mL, IntraVENous, PRN, 03-[birth year]-1976 Donalynn Furlong, APRN - NP    vancomycin (VANCOCIN) 1250 mg in sodium chloride 0.9% 250 mL IVPB, 1,250 mg, IntraVENous, Q12H, Oval Linsey, MD, Last Rate: 166.7 mL/hr at 02/14/22 1013, 1,250 mg at 02/14/22 1013    [START ON 02/15/2022] Vancomycin Trough Reminder, 1 each, Other, Once, 04/17/2022, MD    *Vancomycin: Pharmacy to Dose, 1 each, Other, RX Placeholder, Irving Burton, MD    vasopressin (VASOSTRICT) 20 units in dextrose 5% 100 mL infusion, 0.03 Units/min, IntraVENous, Continuous, Irving Burton, APRN - NP, Last Rate: 9 mL/hr at 02/14/22 1014, 0.03 Units/min at 02/14/22 1014    phenylephrine (NEO-SYNEPHRINE) 50 mg/250 mL infusion, 10-300 mcg/min, IntraVENous, Continuous, 04/16/22, APRN - NP    albumin  human 25% IV solution 25 g, 25 g, IntraVENous, Q6H, Terese Door, APRN - NP    Objective:      BP 102/77   Pulse 94   Temp (!) 102 F (38.9 C) (Bladder)   Resp 22   Ht 5\' 7"  (1.702 m)   Wt 205 lb 4 oz (93.1 kg)   SpO2 100%   BMI 32.15 kg/m  Temp (24hrs), Avg:97.8 F (36.6 C), Min:93 F (33.9 C), Max:102 F (38.9 C)     Intake/Output Summary (Last 24 hours) at 02/14/2022 1135  Last data filed at 02/14/2022 0930  Gross per 24 hour   Intake 763.73 ml   Output 215 ml   Net 548.73 ml    Body mass index is 32.15 kg/m. Estimated Creatinine Clearance: 72 mL/min (based on SCr of 0.86 mg/dL).      Constitutional: NAD  Eyes: PERRL, sclera anicteric  HEENT: normocephalic, atraumatic, mucous membranes moist  Neck: Trachea midline, intubated  Cardiovascular: RRR  Pulmonary: normal chest  rise and fall, CTAB, no stridor, no wheezes/ rales/ rhonchi  Abdominal: S/ND/NT, NABS, no masses, no organomegaly, no rebound, no guarding  Skin: warm, dry  Psychiatric: sedated      Labwork and Ancillary Studies     Recent Results (from the past 24 hour(s))   POCT Glucose    Collection Time: 02/13/22 11:36 PM   Result Value Ref Range    POC Glucose 255 (H) 65 - 105 mg/dL   CBC with Auto Differential    Collection Time: 02/13/22 11:45 PM   Result Value Ref Range    WBC 25.2 (H) 4.0 - 11.0 1000/mm3    Neutrophils Segmented 90.0 (H) 34 - 64 %    RBC 3.42 (L) 3.60 - 5.20 M/uL    Lymphocytes 5.0 (L) 28 - 48 %    Hemoglobin 9.2 (L) 11.0 - 16.0 gm/dl    Monocytes 5.0 1 - 13 %    Hematocrit 34.3 (L) 35.0 - 47.0 %    MCV 100.3 (H) 80.0 - 98.0 fL    MCH 26.9 25.4 - 34.6 pg    MCHC 26.8 (L) 30.0 - 36.0 gm/dl    Platelets 295 621 - 450 1000/mm3    MPV 13.1 (H) 6.0 - 10.0 fL    RDW 57.8 (H) 36.4 - 46.3      Nucleated RBCs 0 0 - 0      Anisocytosis 2      Polychromasia 2      Macrocytes 1      Platelet Appearance NORMAL     CMP    Collection Time: 02/13/22 11:45 PM   Result Value Ref Range    Potassium 7.2 (HH) 3.5 - 5.1 mEq/L    Chloride 96 (L) 98  - 107 mEq/L    Sodium 135 (L) 136 - 145 mEq/L    CO2 37 (H) 20 - 31 mEq/L    Glucose 292 (H) 74 - 106 mg/dl    BUN 23 9 - 23 mg/dl    Creatinine 3.08 6.57 - 1.02 mg/dl    GFR African American >60.0      GFR Non-African American >60      Calcium 9.2 8.7 - 10.4 mg/dl    Anion Gap 2 (L) 5 - 15 mmol/L    AST 20.0 0.0 - 33.9 U/L    ALT 20 10 - 49 U/L    Alkaline Phosphatase 75 46 - 116 U/L    Total Bilirubin 0.20 (L) 0.30 - 1.20 mg/dl    Total Protein 5.7 5.7 - 8.2 gm/dl    Albumin 2.5 (L) 3.4 - 5.0 gm/dl   Lactic Acid Now and in 2 Hours    Collection Time: 02/13/22 11:45 PM   Result Value Ref Range    Lactate 5.4 (HH) 0.5 - 2.2 mmol/L   Troponin    Collection Time: 02/13/22 11:45 PM   Result Value Ref Range    Troponin, High Sensitivity 6 0 - 34 ng/L   Blood Culture 1    Collection Time: 02/13/22 11:45 PM    Specimen: Peripheral blood   Result Value Ref Range    BLOOD CULTURE RESULT Culture In Progress, Daily Updates To Follow     Protime-INR    Collection Time: 02/13/22 11:45 PM   Result Value Ref Range    Protime 13.3 (H) 10.2 - 12.9 seconds    INR 1.1 0.1 - 1.1     Fibrinogen    Collection Time: 02/13/22 11:45 PM  Result Value Ref Range    Fibrinogen 509 (H) 220 - 397 mg/dl   APTT    Collection Time: 02/13/22 11:45 PM   Result Value Ref Range    aPTT 31.8 25.1 - 36.5 seconds   EKG 12 Lead    Collection Time: 02/13/22 11:50 PM   Result Value Ref Range    Ventricular Rate 72 BPM    Atrial Rate 72 BPM    P-R Interval 154 ms    QRS Duration 84 ms    Q-T Interval 396 ms    QTC Calculation (Bezet) 433 ms    Calculated P Axis 96 degrees    Calculated R Axis 72 degrees    Calculated T Axis 66 degrees    DIAGNOSIS, 93000       Normal sinus rhythm with sinus arrhythmia  Septal infarct (cited on or before 11-May-2021)  Abnormal ECG  When compared with ECG of 11-May-2021 17:48,  fusion complexes are no longer present  Questionable change in QRS axis  Confirmed by Jani GravelStrosahl, M.D., [place] AnaKurt [phone] on 02/14/2022 7:46:17 AM     POC CHEM 8     Collection Time: 02/13/22 11:51 PM   Result Value Ref Range    Sodium 132 (L) 136 - 145 mEq/L    Potassium 7.2 (HH) 3.5 - 4.9 mEq/L    Chloride 92 (L) 98 - 107 mEq/L    Total CO2 <> 21 - 32 mmol/L    Glucose 297 (H) 74 - 106 mg/dL    BUN 27 (H) 7 - 25 mg/dl    Creatinine 1.3 0.6 - 1.3 mg/dl    Hematocrit 33 (L) 38 - 45 %    Hemoglobin 11.2 (L) 12.4 - 17.2 gm/dl    Calcium, Ionized 1.095.00 4.40 - 5.40 mg/dL   Troponin    Collection Time: 02/14/22 12:33 AM   Result Value Ref Range    Troponin, High Sensitivity 9 0 - 34 ng/L   Potassium    Collection Time: 02/14/22 12:33 AM   Result Value Ref Range    Potassium 6.4 (HH) 3.5 - 5.1 mEq/L   POC CG4:BLOOD GAS,LACTATE    Collection Time: 02/14/22 12:42 AM   Result Value Ref Range    Ph 7.033 (LL) 7.350 - 7.450      PCO2 >130.0 35.0 - 45.0 mm Hg    PO2 175.0 (H) 75 - 100 mm Hg    HCO3 <> 18.0 - 26.0 mmol/L    O2 Sat <> 90 - 100 %    Total CO2 <> 24 - 29 mmol/L    Lactate 3.81 (H) 0.40 - 2.00 mmol/L    Base Excess <> -2 - 3 mmol/L    Sample Type Art      FIO2 70      Draw Site L Radial      Delivery Systems [employer]      Allen Test Pass      Respiratory Rate 16      Set Rate 16      Mode P-CMV      Peep 5      PRESSURE CONTROL 32      VT Expiration 275     Urine Drug Screen    Collection Time: 02/14/22  1:30 AM   Result Value Ref Range    Amphetamine NEGATIVE NEGATIVE      Barbiturates, Urine NEGATIVE NEGATIVE      Benzodiazepines, Urine NEGATIVE NEGATIVE      Cocaine NEGATIVE NEGATIVE  Marijuana NEGATIVE NEGATIVE      Methadone Screen, Urine NEGATIVE NEGATIVE      Opiates, Urine NEGATIVE NEGATIVE      Phencyclidine NEGATIVE NEGATIVE     Urinalysis    Collection Time: 02/14/22  1:30 AM   Result Value Ref Range    Color, UA Yellow (A) YELLOW,STRAW      Clarity, UA Turbid (A) CLEAR      Glucose, Ur Negative NEGATIVE,Negative mg/dl    Bilirubin, Urine Negative NEGATIVE,Negative      Ketones, Urine Negative NEGATIVE,Negative mg/dl    Specific Gravity, Urine >=1.030 1.005 - 1.030       Blood, Urine Large (A) NEGATIVE,Negative      pH, Urine 5.0 5.0 - 9.0      Protein, Urine >=300 mg/dL (A) NEGATIVE,Negative mg/dl    Urobilinogen, Urine 0.2 E.U./dL 0.0 - 1.0 mg/dl    Nitrite, Urine Negative NEGATIVE,Negative      Leukocyte Esterase, Urine Moderate (A) NEGATIVE,Negative     Microscopic Urinalysis    Collection Time: 02/14/22  1:30 AM   Result Value Ref Range    Squam Epithel, UA 15-29 /LPF    WBC, UA >50 /HPF    RBC, UA >50 /HPF    BACTERIA, URINE 1+ /HPF    Yeast, Urine 4+ /HPF   Strep Pneumoniae Antigen    Collection Time: 02/14/22  1:30 AM    Specimen: Urine   Result Value Ref Range    STREP PNEUMONIAE ANTIGEN, URINE NEGATIVE NEGATIVE     Legionella antigen, urine    Collection Time: 02/14/22  1:30 AM    Specimen: Urine   Result Value Ref Range    Legionella Urinary Ag NEGATIVE NEGATIVE     Urine Drug Screen    Collection Time: 02/14/22  1:30 AM   Result Value Ref Range    Amphetamine NEGATIVE NEGATIVE      Barbiturates, Urine NEGATIVE NEGATIVE      Benzodiazepines, Urine NEGATIVE NEGATIVE      Cocaine NEGATIVE NEGATIVE      Marijuana NEGATIVE NEGATIVE      Methadone Screen, Urine NEGATIVE NEGATIVE      Opiates, Urine NEGATIVE NEGATIVE      Phencyclidine NEGATIVE NEGATIVE     POCT Glucose    Collection Time: 02/14/22  1:46 AM   Result Value Ref Range    POC Glucose 220 (H) 65 - 105 mg/dL   POC ZC5:YIFOY GAS,LACTATE    Collection Time: 02/14/22  2:05 AM   Result Value Ref Range    Ph 7.190 (LL) 7.350 - 7.450      PCO2 102.3 (HH) 35.0 - 45.0 mm Hg    PO2 72.0 (L) 75 - 100 mm Hg    HCO3 39.1 (HH) 18.0 - 26.0 mmol/L    O2 Sat 88.0 (L) 90 - 100 %    Total CO2 42.0 (H) 24 - 29 mmol/L    Lactate 2.20 (H) 0.40 - 2.00 mmol/L    Base Excess 11 (H) -2 - 3 mmol/L    Sample Type Art      FIO2 50      Draw Site L Radial      Delivery Systems Vent      Allen Test Pass      Respiratory Rate 16      Set Rate 16      Mode P-CMV      Peep 10      PRESSURE CONTROL 35  VT Expiration 442     POCT Glucose     Collection Time: 02/14/22  4:52 AM   Result Value Ref Range    POC Glucose 201 (H) 65 - 105 mg/dL   MRSA by PCR    Collection Time: 02/14/22  5:06 AM    Specimen: Nares   Result Value Ref Range    MRSA PCR screen ERROR (A) NEGATIVE     Procalcitonin    Collection Time: 02/14/22  5:06 AM   Result Value Ref Range    Procalcitonin 0.38 0.00 - 0.50 ng/ml   TSH    Collection Time: 02/14/22  5:06 AM   Result Value Ref Range    TSH, High Sensitivity 1.437 0.550 - 4.780 uIU/mL   T4, Free    Collection Time: 02/14/22  5:06 AM   Result Value Ref Range    T4 Free 1.11 0.89 - 1.76 ng/dl   Cortisol Total    Collection Time: 02/14/22  5:06 AM   Result Value Ref Range    CORTISOL 33.74 (H) 3.44 - 22.45 mcg/dl   Lactic Acid Now and in 2 Hours    Collection Time: 02/14/22  5:06 AM   Result Value Ref Range    Lactate 3.3 (H) 0.5 - 2.2 mmol/L   Basic Metabolic Panel    Collection Time: 02/14/22  5:06 AM   Result Value Ref Range    Potassium 6.4 (HH) 3.5 - 5.1 mEq/L    Chloride 97 (L) 98 - 107 mEq/L    Sodium 136 136 - 145 mEq/L    CO2 32 (H) 20 - 31 mEq/L    Glucose 209 (H) 74 - 106 mg/dl    BUN 23 9 - 23 mg/dl    Creatinine 8.29 5.62 - 1.02 mg/dl    GFR African American >60.0      GFR Non-African American >60      Calcium 8.9 8.7 - 10.4 mg/dl    Anion Gap 7 5 - 15 mmol/L   CK    Collection Time: 02/14/22  5:06 AM   Result Value Ref Range    Creatine Phosphokinase 39 34 - 145 U/L   Salicylate    Collection Time: 02/14/22  5:06 AM   Result Value Ref Range    Salicyclic Acid <3.0 2.8 - 20.0 mg/dl   Acetaminophen Level    Collection Time: 02/14/22  5:06 AM   Result Value Ref Range    Acetaminophen Level <2.0 (L) 10.0 - 20.0 mcg/ml   POC CG4:BLOOD GAS,LACTATE    Collection Time: 02/14/22  5:09 AM   Result Value Ref Range    Ph 7.388 7.350 - 7.450      PCO2 65.2 (HH) 35.0 - 45.0 mm Hg    PO2 53.0 (LL) 75 - 100 mm Hg    HCO3 39.3 (HH) 18.0 - 26.0 mmol/L    O2 Sat 85.0 (L) 90 - 100 %    Total CO2 41.0 (H) 24 - 29 mmol/L    Lactate 1.23 0.40  - 2.00 mmol/L    Base Excess 14 (H) -2 - 3 mmol/L    Pt Temp 37.0 C      Sample Type Art      FIO2 40      Draw Site L Radial      Delivery Systems Vent      Allen Test Pass      Respiratory Rate 22      Set Rate 22  Mode P-CMV      Peep 10      PRESSURE CONTROL 30      Notified PA/NP      [employer] Other      VT Expiration 303     Blood Culture 2    Collection Time: 02/14/22  5:35 AM    Specimen: Peripheral blood   Result Value Ref Range    BLOOD CULTURE RESULT Culture In Progress, Daily Updates To Follow     Basic Metabolic Panel    Collection Time: 02/14/22  5:35 AM   Result Value Ref Range    Potassium 6.4 (HH) 3.5 - 5.1 mEq/L    Chloride 96 (L) 98 - 107 mEq/L    Sodium 136 136 - 145 mEq/L    CO2 34 (H) 20 - 31 mEq/L    Glucose 205 (H) 74 - 106 mg/dl    BUN 25 (H) 9 - 23 mg/dl    Creatinine 1.61 0.96 - 1.02 mg/dl    GFR African American >60.0      GFR Non-African American >60      Calcium 9.0 8.7 - 10.4 mg/dl    Anion Gap 6 5 - 15 mmol/L   Ammonia    Collection Time: 02/14/22  5:35 AM   Result Value Ref Range    Ammonia 19.0 11.2 - 31.7 umol/L   POCT Glucose    Collection Time: 02/14/22  7:24 AM   Result Value Ref Range    POC Glucose 197 (H) 65 - 105 mg/dL   POCT Glucose    Collection Time: 02/14/22 10:57 AM   Result Value Ref Range    POC Glucose 168 (H) 65 - 105 mg/dL       CT Head W/O Contrast    Result Date: 02/14/2022  EXAM:  CT HEAD WO CONTRAST HISTORY: ams COMPARISON: 2022 WORKSTATION ID: [id number] TECHNIQUE: Multiplanar CT images of the head were obtained without contrast. All CT exams at this facility use one or more dose reduction techniques including automatic exposure control, mA/kV adjustment per patient's size, or iterative reconstruction technique. FINDINGS: No acute intracranial hemorrhage. 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 Stable diffuse parenchymal volume loss. No hydrocephalus. There is subcortical and periventricular hypodensity as well as hypodense foci within  the basal ganglia compatible with several patterns of nonacute ischemic change. Orbits are unremarkable.  Paranasal sinuses and mastoid air cells are unremarkable. Calvarium intact.     IMPRESSION: No acute intracranial abnormality. Electronically signed by: Albin Felling, MD 02/14/2022 5:07 AM EDT     XR CHEST PORTABLE    Result Date: 02/14/2022  EXAM: XR CHEST PORTABLE INDICATION: central line  COMPARISON: 2023 WORKSTATION ID: [id number] FINDINGS: SUPPORT DEVICES: Stable position of endotracheal and enteric tube partially imaged. Right central venous catheter terminates at the mid SVC. LUNGS/PLEURA: No pneumothorax. Stable lung findings. HEART/MEDIASTINUM: Cardiomediastinal silhouette is normal. OTHER: None.     IMPRESSION: No pneumothorax. Stable lung findings. Electronically signed by: Albin Felling, MD 02/14/2022 1:25 AM EDT     XR CHEST PORTABLE    Result Date: 02/14/2022  EXAM: XR CHEST PORTABLE INDICATION: post intubation  COMPARISON: 2022 WORKSTATION ID: [id number]     FINDINGS/IMPRESSION: Large left pleural effusion and atelectasis with significant volume loss and shifting of the mediastinum to the left. Prominent right perihilar vascular markings which may suggest superimposed pulmonary vascular congestion.  Electronically signed by: Albin Felling, MD 02/14/2022 12:34 AM EDT     CT Chest Pulmonary Embolism W Contrast  Result Date: 02/14/2022  EXAM:  CT CHEST PULMONARY EMBOLISM W CONTRAST INDICATION: Chest Pain COMPARISON: 2023 TECHNIQUE: Multiplanar CT angiography of the chest was performed with intravenous contrast. 3D MIP reformats were performed. All CT exams at this facility use one or more dose reduction techniques including automatic exposure control, mA/kV adjustment per patient's size, or iterative reconstruction technique. WORKSTATION ID: [id number] FINDINGS: SUPPORT DEVICES: Endotracheal tube and enteric tube in good position. CONTRAST BOLUS: Satisfactory. PULMONARY ARTERIES: No  evidence of  pulmonary embolism. LUNGS: Biapical bullae and emphysematous changes. Moderate left pleural effusion and significant lingular and left lower lobe atelectasis. Lingular atelectasis appears unchanged. Minimal right lower lobe atelectasis. No pneumonia. CENTRAL AIRWAYS: Partial compression or occlusion of left lower lobe bronchi. There is right lower lobe distal Airways thickening noted best appreciated on axial image 92 series 5. Nonspecific. PLEURA: See above MEDIASTINUM AND HILA: No lymphadenopathy. HEART: Cardiomegaly. No pericardial effusion. VESSELS: Normal. No dissection or aneurysm. CHEST WALL: Unremarkable. BONES: No acute fracture or aggressive osseous lesion. UPPER ABDOMEN: Unremarkable.     IMPRESSION: 1. Moderate left pleural effusion and significant left lung atelectasis. 2. No pulmonary embolism. Electronically signed by: Albin Felling, MD 02/14/2022 5:07 AM EDT       Medical Decision Making: Complexity     Today's encounter included care coordination and counseling with [name], as well  as review of all pertinent labs/imaging, and available consultant recommendations.    Total time spent: 25    Judithann Sauger, MD  [facility]  02/14/2022, 11:35 AM

## 2022-02-15 ENCOUNTER — Inpatient Hospital Stay: Payer: MEDICARE | Primary: Physician Assistant

## 2022-02-15 ENCOUNTER — Inpatient Hospital Stay: Admit: 2022-02-15 | Payer: MEDICARE | Primary: Physician Assistant

## 2022-02-15 LAB — BASIC METABOLIC PANEL
Anion Gap: 2 mmol/L — ABNORMAL LOW (ref 5–15)
Anion Gap: 3 mmol/L — ABNORMAL LOW (ref 5–15)
Anion Gap: 3 mmol/L — ABNORMAL LOW (ref 5–15)
Anion Gap: 5 mmol/L (ref 5–15)
BUN: 27 mg/dl — ABNORMAL HIGH (ref 9–23)
BUN: 26 mg/dl — ABNORMAL HIGH (ref 9–23)
BUN: 27 mg/dl — ABNORMAL HIGH (ref 9–23)
BUN: 24 mg/dl — ABNORMAL HIGH (ref 9–23)
CO2: 35 mEq/L — ABNORMAL HIGH (ref 20–31)
CO2: 35 mEq/L — ABNORMAL HIGH (ref 20–31)
CO2: 35 mEq/L — ABNORMAL HIGH (ref 20–31)
CO2: 34 mEq/L — ABNORMAL HIGH (ref 20–31)
Calcium: 8.9 mg/dl (ref 8.7–10.4)
Calcium: 9.2 mg/dl (ref 8.7–10.4)
Calcium: 9.3 mg/dl (ref 8.7–10.4)
Calcium: 9 mg/dl (ref 8.7–10.4)
Chloride: 102 mEq/L (ref 98–107)
Chloride: 104 mEq/L (ref 98–107)
Chloride: 100 mEq/L (ref 98–107)
Chloride: 99 mEq/L (ref 98–107)
Creatinine: 0.8 mg/dl (ref 0.55–1.02)
Creatinine: 0.79 mg/dl (ref 0.55–1.02)
Creatinine: 0.82 mg/dl (ref 0.55–1.02)
Creatinine: 0.76 mg/dl (ref 0.55–1.02)
GFR African American: >60
GFR African American: >60
GFR African American: >60
GFR African American: >60
GFR Non-African American: >60
GFR Non-African American: >60
GFR Non-African American: >60
GFR Non-African American: >60
Glucose: 177 mg/dl — ABNORMAL HIGH (ref 74–106)
Glucose: 163 mg/dl — ABNORMAL HIGH (ref 74–106)
Glucose: 151 mg/dl — ABNORMAL HIGH (ref 74–106)
Glucose: 140 mg/dl — ABNORMAL HIGH (ref 74–106)
Potassium: 6.2 mEq/L (ref 3.5–5.1)
Potassium: 5.7 mEq/L — ABNORMAL HIGH (ref 3.5–5.1)
Potassium: 5.2 mEq/L — ABNORMAL HIGH (ref 3.5–5.1)
Potassium: 4.3 mEq/L (ref 3.5–5.1)
Sodium: 139 mEq/L (ref 136–145)
Sodium: 142 mEq/L (ref 136–145)
Sodium: 138 mEq/L (ref 136–145)
Sodium: 138 mEq/L (ref 136–145)

## 2022-02-15 LAB — CBC WITH AUTO DIFFERENTIAL
Basophils: 0.2 % (ref 0–3)
Eosinophils: 0 % (ref 0–5)
Hematocrit: 24.2 % — ABNORMAL LOW (ref 35.0–47.0)
Hemoglobin: 7.4 gm/dl — ABNORMAL LOW (ref 11.0–16.0)
Immature Granulocytes: 0.8 % (ref 0.0–3.0)
Lymphocytes: 4 % — ABNORMAL LOW (ref 28–48)
MCH: 26.6 pg (ref 25.4–34.6)
MCHC: 30.6 gm/dl (ref 30.0–36.0)
MCV: 87.1 fL (ref 80.0–98.0)
MPV: 12.8 fL — ABNORMAL HIGH (ref 6.0–10.0)
Monocytes: 3.2 % (ref 1–13)
Neutrophils Segmented: 91.8 % — ABNORMAL HIGH (ref 34–64)
Nucleated RBCs: 0 (ref 0–0)
Platelets: 127 10*3/uL — ABNORMAL LOW (ref 140–450)
RBC: 2.78 M/uL — ABNORMAL LOW (ref 3.60–5.20)
RDW: 56.1 — ABNORMAL HIGH (ref 36.4–46.3)
WBC: 12.4 10*3/uL — ABNORMAL HIGH (ref 4.0–11.0)

## 2022-02-15 LAB — LACTATE DEHYDROGENASE, BODY FLUID: LD, Fluid: 93 U/L

## 2022-02-15 LAB — POC CG4:BLOOD GAS,LACTATE
Base Excess: 11 mmol/L — ABNORMAL HIGH (ref <=3)
FIO2: 45
HCO3: 34.9 mmol/L (ref 18.0–26.0)
Lactate: 0.49 mmol/L (ref 0.40–2.00)
O2 Sat: 95 % (ref 90–100)
PCO2: 46.7 mm Hg — ABNORMAL HIGH (ref 35.0–45.0)
PO2: 73 mm Hg — ABNORMAL LOW (ref 75–100)
PRESSURE CONTROL: 26
Peep: 5
Ph: 7.481 — ABNORMAL HIGH (ref 7.350–7.450)
Pt Temp: 37
Respiratory Rate: 22
Set Rate: 22
Total CO2: 36 mmol/L — ABNORMAL HIGH (ref 24–29)
VT Expiration: 307

## 2022-02-15 LAB — LACTATE DEHYDROGENASE: LD: 178 U/L (ref 120–246)

## 2022-02-15 LAB — BASIC METABOLIC PANEL W/ REFLEX TO MG FOR LOW K
Anion Gap: 4 mmol/L — ABNORMAL LOW (ref 5–15)
BUN: 25 mg/dl — ABNORMAL HIGH (ref 9–23)
CO2: 34 mEq/L — ABNORMAL HIGH (ref 20–31)
Calcium: 9.2 mg/dl (ref 8.7–10.4)
Chloride: 101 mEq/L (ref 98–107)
Creatinine: 0.79 mg/dl (ref 0.55–1.02)
Glucose: 147 mg/dl — ABNORMAL HIGH (ref 74–106)
Potassium: 5.5 mEq/L — ABNORMAL HIGH (ref 3.5–5.1)
Sodium: 139 mEq/L (ref 136–145)

## 2022-02-15 LAB — FOLATE: Folate: 16.42 ng/ml (ref 5.38–24.00)

## 2022-02-15 LAB — PROTEIN, BODY FLUID: Protein, body fluid: <2 gm/dl

## 2022-02-15 LAB — CELL COUNT WITH DIFFERENTIAL, BODY FLUID: Body Fluid Wbc: 325

## 2022-02-15 LAB — HIV 1/2 AG/AB, 4TH GENERATION,W RFLX CONFIRM
HIV 1/2 Antibody: NONREACTIVE
HIV-1 P24 Ag: NONREACTIVE

## 2022-02-15 LAB — GLUCOSE, BODY FLUID: Glucose, Body Fluid: 161 mg/dl

## 2022-02-15 LAB — ECHOCARDIOGRAM COMPLETE 2D W DOPPLER W COLOR: Left Ventricular Ejection Fraction: 67

## 2022-02-15 LAB — HEMOGLOBIN A1C: Hemoglobin A1C: 6.4 % — ABNORMAL HIGH (ref 3.8–5.6)

## 2022-02-15 LAB — ECHOCARDIOGRAM 2D W DOPPLER W COLOR W CONTRAST: Left Ventricular Ejection Fraction: 67

## 2022-02-15 LAB — POCT GLUCOSE
POC Glucose: 187 mg/dL — ABNORMAL HIGH (ref 65–105)
POC Glucose: 174 mg/dL — ABNORMAL HIGH (ref 65–105)
POC Glucose: 173 mg/dL — ABNORMAL HIGH (ref 65–105)
POC Glucose: 160 mg/dL — ABNORMAL HIGH (ref 65–105)

## 2022-02-15 MED ORDER — METHYLPREDNISOLONE NA SUC (PF) 40 MG IJ SOLR
40 MG | Freq: Two times a day (BID) | INTRAMUSCULAR | Status: AC
Start: 2022-02-15 — End: 2022-02-16
  Administered 2022-02-15 – 2022-02-16 (×3): 40 mg via INTRAVENOUS

## 2022-02-15 MED ORDER — NORMAL SALINE FLUSH 0.9 % IV SOLN
0.9 % | Freq: Two times a day (BID) | INTRAVENOUS | Status: AC
Start: 2022-02-15 — End: ?
  Administered 2022-02-15 – 2022-02-26 (×19): 10 mL via INTRAVENOUS

## 2022-02-15 MED ORDER — SODIUM ZIRCONIUM CYCLOSILICATE 10 G PO PACK
10 g | Freq: Three times a day (TID) | ORAL | Status: AC
Start: 2022-02-15 — End: 2022-02-16
  Administered 2022-02-15 – 2022-02-16 (×3): 10 g via ORAL

## 2022-02-15 MED ORDER — FUROSEMIDE 10 MG/ML IJ SOLN
10 MG/ML | Freq: Every day | INTRAMUSCULAR | Status: AC
Start: 2022-02-15 — End: 2022-02-16
  Administered 2022-02-15 – 2022-02-16 (×2): 20 mg via INTRAVENOUS

## 2022-02-15 MED ORDER — MICAFUNGIN SODIUM 100 MG IV SOLR
100 MG | Freq: Every day | INTRAVENOUS | Status: AC
Start: 2022-02-15 — End: 2022-02-17
  Administered 2022-02-15 – 2022-02-17 (×3): 150 mg via INTRAVENOUS

## 2022-02-15 MED ORDER — PATIROMER SORBITEX CALCIUM 8.4 G PO PACK
8.4 g | Freq: Two times a day (BID) | ORAL | Status: AC
Start: 2022-02-15 — End: 2022-02-15
  Administered 2022-02-15: 03:00:00 8.4 g via ORAL

## 2022-02-15 MED ORDER — HEPARIN SODIUM (PORCINE) 5000 UNIT/ML IJ SOLN
5000 UNIT/ML | Freq: Two times a day (BID) | INTRAMUSCULAR | Status: AC
Start: 2022-02-15 — End: 2022-02-17
  Administered 2022-02-16 – 2022-02-17 (×4): 5000 [IU] via SUBCUTANEOUS

## 2022-02-15 MED ORDER — PERFLUTREN LIPID MICROSPHERE IV SUSP
Freq: Once | INTRAVENOUS | Status: AC | PRN
Start: 2022-02-15 — End: 2022-02-15
  Administered 2022-02-15: 14:00:00 1.5 mL via INTRAVENOUS

## 2022-02-15 MED FILL — VANCOMYCIN 1250 MG IN NS 250 ML (PREMIX) IVPB: Qty: 250

## 2022-02-15 MED FILL — SOLU-MEDROL (PF) 40 MG IJ SOLR: 40 MG | INTRAMUSCULAR | Qty: 40

## 2022-02-15 MED FILL — ACETYLCYSTEINE 10 % IN SOLN: 10 % | RESPIRATORY_TRACT | Qty: 4

## 2022-02-15 MED FILL — LOKELMA 10 G PO PACK: 10 g | ORAL | Qty: 1

## 2022-02-15 MED FILL — MICAFUNGIN SODIUM 100 MG IV SOLR: 100 MG | INTRAVENOUS | Qty: 100

## 2022-02-15 MED FILL — PIPERACILLIN SOD-TAZOBACTAM SO 3.375 (3-0.375) G IV SOLR: 3.375 (3-0.375) g | INTRAVENOUS | Qty: 3375

## 2022-02-15 MED FILL — VELTASSA 8.4 G PO PACK: 8.4 g | ORAL | Qty: 1

## 2022-02-15 MED FILL — DEFINITY 6.52 MG/ML IV SUSP: 6.52 MG/ML | INTRAVENOUS | Qty: 1.5

## 2022-02-15 MED FILL — MONTELUKAST SODIUM 10 MG PO TABS: 10 MG | ORAL | Qty: 1

## 2022-02-15 MED FILL — LANTUS 100 UNIT/ML SC SOLN: 100 UNIT/ML | SUBCUTANEOUS | Qty: 1

## 2022-02-15 MED FILL — IPRATROPIUM-ALBUTEROL 0.5-2.5 (3) MG/3ML IN SOLN: RESPIRATORY_TRACT | Qty: 3

## 2022-02-15 MED FILL — BUDESONIDE 0.5 MG/2ML IN SUSP: 0.5 MG/2ML | RESPIRATORY_TRACT | Qty: 2

## 2022-02-15 MED FILL — PANTOPRAZOLE SODIUM 40 MG IV SOLR: 40 MG | INTRAVENOUS | Qty: 40

## 2022-02-15 MED FILL — SODIUM CHLORIDE FLUSH 0.9 % IV SOLN: 0.9 % | INTRAVENOUS | Qty: 10

## 2022-02-15 MED FILL — ROSUVASTATIN CALCIUM 10 MG PO TABS: 10 MG | ORAL | Qty: 1

## 2022-02-15 MED FILL — DOXY 100 100 MG IV SOLR: 100 MG | INTRAVENOUS | Qty: 100

## 2022-02-15 MED FILL — FENTANYL CITRATE-NACL 1-0.9 MG/100ML-% IV SOLN: INTRAVENOUS | Qty: 100

## 2022-02-15 MED FILL — FUROSEMIDE 10 MG/ML IJ SOLN: 10 MG/ML | INTRAMUSCULAR | Qty: 2

## 2022-02-15 MED FILL — ADMELOG 100 UNIT/ML IJ SOLN: 100 UNIT/ML | INTRAMUSCULAR | Qty: 1
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
## 2022-02-15 NOTE — Progress Notes (Signed)
Full 2D echo with contrast complete

## 2022-02-15 NOTE — Progress Notes (Signed)
Assisted Dr. Madelaine Etienne with bedside left thoracentesis. Telephone consent obtained from patient husband prior to procedure. mostly clear yellow fluid removed, hand delivered to lab for testing. Patient tolerated procedure well.

## 2022-02-15 NOTE — Progress Notes (Signed)
Patient tolerated Spontaneous Breathing Trials with PS of Pressure Support (cm H2O): 10 cm H2O, peep of 5, FiO2 45%  Weaning Parameters are Good  Arterial Blood Gas is Good  Patient alert and following commands  Patient extubated and liberated from mechanical ventilation without incident  Bilateral BS heard and positive cuff leak  Patient placed on Heated high flow cannula at 40 L/min 50 %  Wean FiO2 as tolerated
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
## 2022-02-15 NOTE — Progress Notes (Signed)
02/15/22 1528   Weaning Parameters   Spontaneous Breathing Trial Complete Yes   Respiratory Rate Observed 15   Ve 6.5   VT 349   RSBI 23   NIF -20   PEF 21   VC 458   Artesian Agitation Sedation Scale (RASS) 0

## 2022-02-15 NOTE — Progress Notes (Incomplete)
Progress Note    Patient: [name] [name]   Age:  71 y.o.  DOA: 02/13/2022   Admit Dx / CC: Acute hyperkalemia [E87.5]  Hyperglycemia [R73.9]  Septic shock (HCC) [A41.9, R65.21]  Acute respiratory failure with hypoxia and hypercapnia (HCC) [J96.01, J96.02]  Community acquired pneumonia of left lung, unspecified part of lung [J18.9]  LOS:  LOS: 1 day     Active Problems       Plan:       Subjective:       Objective:   Visit Vitals  BP (!) 156/94   Pulse 88   Temp 97.7 F (36.5 C) (Bladder)   Resp 16   Ht 5\' 7"  (1.702 m)   Wt 215 lb 6.2 oz (97.7 kg)   SpO2 98%   BMI 33.73 kg/m       Physical Exam:  General appearance: alert, cooperative, no distress, appears stated age  Head: Normocephalic, without obvious abnormality, atraumatic  Neck: supple, trachea midline  Lungs: CTA bilaterally  Heart: Regular rhythm, no murmur  Abdomen: soft, non-tender. Bowel sounds Audible,  Extremities: No edema, distal pulses palpable  Skin: Warm and dry  Neurologic: Awake and alert    Intake and Output:  Current Shift:  No intake/output data recorded.  Last three shifts:  09/04 0701 - 09/05 1900  In: 1757.6 [I.V.:528.2]  Out: 2195 [Urine:2195]    Lab/Data Reviewed:  Recent Results (from the past 48 hour(s))   POCT Glucose    Collection Time: 02/13/22 11:36 PM   Result Value Ref Range    POC Glucose 255 (H) 65 - 105 mg/dL   CBC with Auto Differential    Collection Time: 02/13/22 11:45 PM   Result Value Ref Range    WBC 25.2 (H) 4.0 - 11.0 1000/mm3    Neutrophils Segmented 90.0 (H) 34 - 64 %    RBC 3.42 (L) 3.60 - 5.20 M/uL    Lymphocytes 5.0 (L) 28 - 48 %    Hemoglobin 9.2 (L) 11.0 - 16.0 gm/dl    Monocytes 5.0 1 - 13 %    Hematocrit 34.3 (L) 35.0 - 47.0 %    MCV 100.3 (H) 80.0 - 98.0 fL    MCH 26.9 25.4 - 34.6 pg    MCHC 26.8 (L) 30.0 - 36.0 gm/dl    Platelets 557 322 - 450 1000/mm3    MPV 13.1 (H) 6.0 - 10.0 fL    RDW 57.8 (H) 36.4 - 46.3      Nucleated RBCs 0 0 - 0      Anisocytosis 2      Polychromasia 2      Macrocytes 1      Platelet  Appearance NORMAL     CMP    Collection Time: 02/13/22 11:45 PM   Result Value Ref Range    Potassium 7.2 (HH) 3.5 - 5.1 mEq/L    Chloride 96 (L) 98 - 107 mEq/L    Sodium 135 (L) 136 - 145 mEq/L    CO2 37 (H) 20 - 31 mEq/L    Glucose 292 (H) 74 - 106 mg/dl    BUN 23 9 - 23 mg/dl    Creatinine 0.25 4.27 - 1.02 mg/dl    GFR African American >60.0      GFR Non-African American >60      Calcium 9.2 8.7 - 10.4 mg/dl    Anion Gap 2 (L) 5 - 15 mmol/L    AST 20.0 0.0 - 33.9  U/L    ALT 20 10 - 49 U/L    Alkaline Phosphatase 75 46 - 116 U/L    Total Bilirubin 0.20 (L) 0.30 - 1.20 mg/dl    Total Protein 5.7 5.7 - 8.2 gm/dl    Albumin 2.5 (L) 3.4 - 5.0 gm/dl   Lactic Acid Now and in 2 Hours    Collection Time: 02/13/22 11:45 PM   Result Value Ref Range    Lactate 5.4 (HH) 0.5 - 2.2 mmol/L   Troponin    Collection Time: 02/13/22 11:45 PM   Result Value Ref Range    Troponin, High Sensitivity 6 0 - 34 ng/L   Blood Culture 1    Collection Time: 02/13/22 11:45 PM    Specimen: Peripheral blood   Result Value Ref Range    BLOOD CULTURE RESULT        Gram stain Gram Positive Cocci In Pairs  Called to/read back MIA RN  on 02/14/2022  at 1221  by JR MLS  Gram stain Yeast      Isolate (A)       Nonhemolytic Streptococcus Species Isolated  Identification And Susceptibility To Follow     Protime-INR    Collection Time: 02/13/22 11:45 PM   Result Value Ref Range    Protime 13.3 (H) 10.2 - 12.9 seconds    INR 1.1 0.1 - 1.1     Fibrinogen    Collection Time: 02/13/22 11:45 PM   Result Value Ref Range    Fibrinogen 509 (H) 220 - 397 mg/dl   APTT    Collection Time: 02/13/22 11:45 PM   Result Value Ref Range    aPTT 31.8 25.1 - 36.5 seconds   EKG 12 Lead    Collection Time: 02/13/22 11:50 PM   Result Value Ref Range    Ventricular Rate 72 BPM    Atrial Rate 72 BPM    P-R Interval 154 ms    QRS Duration 84 ms    Q-T Interval 396 ms    QTC Calculation (Bezet) 433 ms    Calculated P Axis 96 degrees    Calculated R Axis 72 degrees    Calculated T  Axis 66 degrees    DIAGNOSIS, 93000       Normal sinus rhythm with sinus arrhythmia  Septal infarct (cited on or before 11-May-2021)  Abnormal ECG  When compared with ECG of 11-May-2021 17:48,  fusion complexes are no longer present  Questionable change in QRS axis  Confirmed by Jani Gravel, M.D., [place] [facility] [phone]) on 02/14/2022 7:46:17 AM     POC CHEM 8    Collection Time: 02/13/22 11:51 PM   Result Value Ref Range    Sodium 132 (L) 136 - 145 mEq/L    Potassium 7.2 (HH) 3.5 - 4.9 mEq/L    Chloride 92 (L) 98 - 107 mEq/L    Total CO2 <> 21 - 32 mmol/L    Glucose 297 (H) 74 - 106 mg/dL    BUN 27 (H) 7 - 25 mg/dl    Creatinine 1.3 0.6 - 1.3 mg/dl    Hematocrit 33 (L) 38 - 45 %    Hemoglobin 11.2 (L) 12.4 - 17.2 gm/dl    Calcium, Ionized 8.11 4.40 - 5.40 mg/dL   Troponin    Collection Time: 02/14/22 12:33 AM   Result Value Ref Range    Troponin, High Sensitivity 9 0 - 34 ng/L   Potassium    Collection Time: 02/14/22 12:33 AM  Result Value Ref Range    Potassium 6.4 (HH) 3.5 - 5.1 mEq/L   POC CG4:BLOOD GAS,LACTATE    Collection Time: 02/14/22 12:42 AM   Result Value Ref Range    Ph 7.033 (LL) 7.350 - 7.450      PCO2 >130.0 35.0 - 45.0 mm Hg    PO2 175.0 (H) 75 - 100 mm Hg    HCO3 <> 18.0 - 26.0 mmol/L    O2 Sat <> 90 - 100 %    Total CO2 <> 24 - 29 mmol/L    Lactate 3.81 (H) 0.40 - 2.00 mmol/L    Base Excess <> -2 - 3 mmol/L    Sample Type Art      FIO2 70      Draw Site L Radial      Delivery Systems [employer] Test Pass      Respiratory Rate 16      Set Rate 16      Mode P-CMV      Peep 5      PRESSURE CONTROL 32      VT Expiration 275     Urine Drug Screen    Collection Time: 02/14/22  1:30 AM   Result Value Ref Range    Amphetamine NEGATIVE NEGATIVE      Barbiturates, Urine NEGATIVE NEGATIVE      Benzodiazepines, Urine NEGATIVE NEGATIVE      Cocaine NEGATIVE NEGATIVE      Marijuana NEGATIVE NEGATIVE      Methadone Screen, Urine NEGATIVE NEGATIVE      Opiates, Urine NEGATIVE NEGATIVE      Phencyclidine NEGATIVE NEGATIVE      Urine Culture    Collection Time: 02/14/22  1:30 AM    Specimen: Urine   Result Value Ref Range    Culture Result (A)       >100,000 CFU/mL  Mixed Flora, three or more different organisms present including;      Isolate (A)       <10,000 CFU/mL  Gram Negative Bacilli Isolated  Identification And Susceptibility To Follow     Urinalysis    Collection Time: 02/14/22  1:30 AM   Result Value Ref Range    Color, UA Yellow (A) YELLOW,STRAW      Clarity, UA Turbid (A) CLEAR      Glucose, Ur Negative NEGATIVE,Negative mg/dl    Bilirubin, Urine Negative NEGATIVE,Negative      Ketones, Urine Negative NEGATIVE,Negative mg/dl    Specific Gravity, Urine >=1.030 1.005 - 1.030      Blood, Urine Large (A) NEGATIVE,Negative      pH, Urine 5.0 5.0 - 9.0      Protein, Urine >=300 mg/dL (A) NEGATIVE,Negative mg/dl    Urobilinogen, Urine 0.2 E.U./dL 0.0 - 1.0 mg/dl    Nitrite, Urine Negative NEGATIVE,Negative      Leukocyte Esterase, Urine Moderate (A) NEGATIVE,Negative     Microscopic Urinalysis    Collection Time: 02/14/22  1:30 AM   Result Value Ref Range    Squam Epithel, UA 15-29 /LPF    WBC, UA >50 /HPF    RBC, UA >50 /HPF    BACTERIA, URINE 1+ /HPF    Yeast, Urine 4+ /HPF   Strep Pneumoniae Antigen    Collection Time: 02/14/22  1:30 AM    Specimen: Urine   Result Value Ref Range    STREP PNEUMONIAE ANTIGEN, URINE NEGATIVE NEGATIVE     Legionella antigen, urine  Collection Time: 02/14/22  1:30 AM    Specimen: Urine   Result Value Ref Range    Legionella Urinary Ag NEGATIVE NEGATIVE     Urine Drug Screen    Collection Time: 02/14/22  1:30 AM   Result Value Ref Range    Amphetamine NEGATIVE NEGATIVE      Barbiturates, Urine NEGATIVE NEGATIVE      Benzodiazepines, Urine NEGATIVE NEGATIVE      Cocaine NEGATIVE NEGATIVE      Marijuana NEGATIVE NEGATIVE      Methadone Screen, Urine NEGATIVE NEGATIVE      Opiates, Urine NEGATIVE NEGATIVE      Phencyclidine NEGATIVE NEGATIVE     POCT Glucose    Collection Time: 02/14/22  1:46 AM    Result Value Ref Range    POC Glucose 220 (H) 65 - 105 mg/dL   POC ZO1:WRUEA GAS,LACTATE    Collection Time: 02/14/22  2:05 AM   Result Value Ref Range    Ph 7.190 (LL) 7.350 - 7.450      PCO2 102.3 (HH) 35.0 - 45.0 mm Hg    PO2 72.0 (L) 75 - 100 mm Hg    HCO3 39.1 (HH) 18.0 - 26.0 mmol/L    O2 Sat 88.0 (L) 90 - 100 %    Total CO2 42.0 (H) 24 - 29 mmol/L    Lactate 2.20 (H) 0.40 - 2.00 mmol/L    Base Excess 11 (H) -2 - 3 mmol/L    Sample Type Art      FIO2 50      Draw Site L Radial      Delivery Systems Vent      Allen Test Pass      Respiratory Rate 16      Set Rate 16      Mode P-CMV      Peep 10      PRESSURE CONTROL 35      VT Expiration 442     POCT Glucose    Collection Time: 02/14/22  4:52 AM   Result Value Ref Range    POC Glucose 201 (H) 65 - 105 mg/dL   MRSA by PCR    Collection Time: 02/14/22  5:06 AM    Specimen: Nares   Result Value Ref Range    MRSA PCR screen ERROR (A) NEGATIVE     Procalcitonin    Collection Time: 02/14/22  5:06 AM   Result Value Ref Range    Procalcitonin 0.38 0.00 - 0.50 ng/ml   TSH    Collection Time: 02/14/22  5:06 AM   Result Value Ref Range    TSH, High Sensitivity 1.437 0.550 - 4.780 uIU/mL   T4, Free    Collection Time: 02/14/22  5:06 AM   Result Value Ref Range    T4 Free 1.11 0.89 - 1.76 ng/dl   Cortisol Total    Collection Time: 02/14/22  5:06 AM   Result Value Ref Range    CORTISOL 33.74 (H) 3.44 - 22.45 mcg/dl   Lactic Acid Now and in 2 Hours    Collection Time: 02/14/22  5:06 AM   Result Value Ref Range    Lactate 3.3 (H) 0.5 - 2.2 mmol/L   Basic Metabolic Panel    Collection Time: 02/14/22  5:06 AM   Result Value Ref Range    Potassium 6.4 (HH) 3.5 - 5.1 mEq/L    Chloride 97 (L) 98 - 107 mEq/L    Sodium 136 136 -  145 mEq/L    CO2 32 (H) 20 - 31 mEq/L    Glucose 209 (H) 74 - 106 mg/dl    BUN 23 9 - 23 mg/dl    Creatinine 5.78 4.69 - 1.02 mg/dl    GFR African American >60.0      GFR Non-African American >60      Calcium 8.9 8.7 - 10.4 mg/dl    Anion Gap 7 5 - 15  mmol/L   CK    Collection Time: 02/14/22  5:06 AM   Result Value Ref Range    Creatine Phosphokinase 39 34 - 145 U/L   Salicylate    Collection Time: 02/14/22  5:06 AM   Result Value Ref Range    Salicyclic Acid <3.0 2.8 - 20.0 mg/dl   Acetaminophen Level    Collection Time: 02/14/22  5:06 AM   Result Value Ref Range    Acetaminophen Level <2.0 (L) 10.0 - 20.0 mcg/ml   POC CG4:BLOOD GAS,LACTATE    Collection Time: 02/14/22  5:09 AM   Result Value Ref Range    Ph 7.388 7.350 - 7.450      PCO2 65.2 (HH) 35.0 - 45.0 mm Hg    PO2 53.0 (LL) 75 - 100 mm Hg    HCO3 39.3 (HH) 18.0 - 26.0 mmol/L    O2 Sat 85.0 (L) 90 - 100 %    Total CO2 41.0 (H) 24 - 29 mmol/L    Lactate 1.23 0.40 - 2.00 mmol/L    Base Excess 14 (H) -2 - 3 mmol/L    Pt Temp 37.0 C      Sample Type Art      FIO2 40      Draw Site L Radial      Delivery Systems Vent      Allen Test Pass      Respiratory Rate 22      Set Rate 22      Mode P-CMV      Peep 10      PRESSURE CONTROL 30      Notified PA/NP      HEL[employer] Other      VT Expiration 303     Blood Culture 2    Collection Time: 02/14/22  5:35 AM    Specimen: Peripheral blood   Result Value Ref Range    BLOOD CULTURE RESULT No Growth At 24 Hours     Basic Metabolic Panel    Collection Time: 02/14/22  5:35 AM   Result Value Ref Range    Potassium 6.4 (HH) 3.5 - 5.1 mEq/L    Chloride 96 (L) 98 - 107 mEq/L    Sodium 136 136 - 145 mEq/L    CO2 34 (H) 20 - 31 mEq/L    Glucose 205 (H) 74 - 106 mg/dl    BUN 25 (H) 9 - 23 mg/dl    Creatinine 6.29 5.28 - 1.02 mg/dl    GFR African American >60.0      GFR Non-African American >60      Calcium 9.0 8.7 - 10.4 mg/dl    Anion Gap 6 5 - 15 mmol/L   Ammonia    Collection Time: 02/14/22  5:35 AM   Result Value Ref Range    Ammonia 19.0 11.2 - 31.7 umol/L   POCT Glucose    Collection Time: 02/14/22  7:24 AM   Result Value Ref Range    POC Glucose 197 (H) 65 - 105 mg/dL  POCT Glucose    Collection Time: 02/14/22 10:57 AM   Result Value Ref Range    POC Glucose 168 (H) 65 - 105  mg/dL   Lactic Acid Now and in 2 Hours    Collection Time: 02/14/22 11:06 AM   Result Value Ref Range    Lactate 1.7 0.5 - 2.2 mmol/L   Basic Metabolic Panel    Collection Time: 02/14/22 11:06 AM   Result Value Ref Range    Potassium 6.2 (HH) 3.5 - 5.1 mEq/L    Chloride 99 98 - 107 mEq/L    Sodium 139 136 - 145 mEq/L    CO2 35 (H) 20 - 31 mEq/L    Glucose 140 (H) 74 - 106 mg/dl    BUN 26 (H) 9 - 23 mg/dl    Creatinine 7.82 9.56 - 1.02 mg/dl    GFR African American >60.0      GFR Non-African American >60      Calcium 9.2 8.7 - 10.4 mg/dl    Anion Gap 5 5 - 15 mmol/L   POC CG4:BLOOD GAS,LACTATE    Collection Time: 02/14/22  4:39 PM   Result Value Ref Range    Ph 7.427 7.350 - 7.450      PCO2 56.6 (H) 35.0 - 45.0 mm Hg    PO2 109.0 (H) 75 - 100 mm Hg    HCO3 37.3 (HH) 18.0 - 26.0 mmol/L    O2 Sat 98.0 90 - 100 %    Total CO2 39.0 (H) 24 - 29 mmol/L    Lactate 1.15 0.40 - 2.00 mmol/L    Base Excess 13 (H) -2 - 3 mmol/L    Sample Type Art      FIO2 50      Draw Site L Radial      Delivery Systems Vent      Allen Test Pass      Respiratory Rate 22      Set Rate 22      Mode P-CMV      Peep 5      PRESSURE CONTROL 26      VT Expiration 279     Basic Metabolic Panel    Collection Time: 02/14/22  6:27 PM   Result Value Ref Range    Potassium 6.2 (HH) 3.5 - 5.1 mEq/L    Chloride 100 98 - 107 mEq/L    Sodium 139 136 - 145 mEq/L    CO2 37 (H) 20 - 31 mEq/L    Glucose 199 (H) 74 - 106 mg/dl    BUN 28 (H) 9 - 23 mg/dl    Creatinine 2.13 0.86 - 1.02 mg/dl    GFR African American >60.0      GFR Non-African American >60      Calcium 9.0 8.7 - 10.4 mg/dl    Anion Gap 2 (L) 5 - 15 mmol/L   POCT Glucose    Collection Time: 02/14/22  6:56 PM   Result Value Ref Range    POC Glucose 208 (H) 65 - 105 mg/dL   Basic Metabolic Panel    Collection Time: 02/14/22  8:58 PM   Result Value Ref Range    Potassium 6.2 (HH) 3.5 - 5.1 mEq/L    Chloride 102 98 - 107 mEq/L    Sodium 139 136 - 145 mEq/L    CO2 35 (H) 20 - 31 mEq/L    Glucose 177 (H) 74 -  106 mg/dl    BUN  27 (H) 9 - 23 mg/dl    Creatinine 9.62 9.52 - 1.02 mg/dl    GFR African American >60.0      GFR Non-African American >60      Calcium 8.9 8.7 - 10.4 mg/dl    Anion Gap 2 (L) 5 - 15 mmol/L   POCT Glucose    Collection Time: 02/14/22 11:49 PM   Result Value Ref Range    POC Glucose 187 (H) 65 - 105 mg/dL   Basic Metabolic Panel    Collection Time: 02/15/22  1:20 AM   Result Value Ref Range    Potassium 5.7 (H) 3.5 - 5.1 mEq/L    Chloride 104 98 - 107 mEq/L    Sodium 142 136 - 145 mEq/L    CO2 35 (H) 20 - 31 mEq/L    Glucose 163 (H) 74 - 106 mg/dl    BUN 26 (H) 9 - 23 mg/dl    Creatinine 8.41 3.24 - 1.02 mg/dl    GFR African American >60.0      GFR Non-African American >60      Calcium 9.2 8.7 - 10.4 mg/dl    Anion Gap 3 (L) 5 - 15 mmol/L   CBC with Auto Differential    Collection Time: 02/15/22  3:31 AM   Result Value Ref Range    WBC 12.4 (H) 4.0 - 11.0 1000/mm3    RBC 2.78 (L) 3.60 - 5.20 M/uL    Hemoglobin 7.4 (L) 11.0 - 16.0 gm/dl    Hematocrit 40.1 (L) 35.0 - 47.0 %    MCV 87.1 80.0 - 98.0 fL    MCH 26.6 25.4 - 34.6 pg    MCHC 30.6 30.0 - 36.0 gm/dl    Platelets 027 (L) 253 - 450 1000/mm3    MPV 12.8 (H) 6.0 - 10.0 fL    RDW 56.1 (H) 36.4 - 46.3      Nucleated RBCs 0 0 - 0      Immature Granulocytes 0.8 0.0 - 3.0 %    Neutrophils Segmented 91.8 (H) 34 - 64 %    Lymphocytes 4.0 (L) 28 - 48 %    Monocytes 3.2 1 - 13 %    Eosinophils 0.0 0 - 5 %    Basophils 0.2 0 - 3 %   Basic Metabolic Panel w/ Reflex to MG    Collection Time: 02/15/22  3:31 AM   Result Value Ref Range    Potassium 5.5 (H) 3.5 - 5.1 mEq/L    Chloride 101 98 - 107 mEq/L    Sodium 139 136 - 145 mEq/L    CO2 34 (H) 20 - 31 mEq/L    Glucose 147 (H) 74 - 106 mg/dl    BUN 25 (H) 9 - 23 mg/dl    Creatinine 6.64 4.03 - 1.02 mg/dl    Calcium 9.2 8.7 - 47.4 mg/dl    Anion Gap 4 (L) 5 - 15 mmol/L   Hemoglobin A1C    Collection Time: 02/15/22  3:31 AM   Result Value Ref Range    Hemoglobin A1C 6.4 (H) 3.8 - 5.6 %   Folate    Collection Time:  02/15/22  3:31 AM   Result Value Ref Range    Folate 16.42 5.38 - 24.00 ng/ml   POC CG4:BLOOD GAS,LACTATE    Collection Time: 02/15/22  3:53 AM   Result Value Ref Range    Ph 7.481 (H) 7.350 - 7.450      PCO2 46.7 (  H) 35.0 - 45.0 mm Hg    PO2 73.0 (L) 75 - 100 mm Hg    HCO3 34.9 (HH) 18.0 - 26.0 mmol/L    O2 Sat 95.0 90 - 100 %    Total CO2 36.0 (H) 24 - 29 mmol/L    Lactate 0.49 0.40 - 2.00 mmol/L    Base Excess 11 (H) -2 - 3 mmol/L    Pt Temp 37.0 C      Sample Type Art      FIO2 45      Draw Site L Radial      Delivery Systems Vent      Allen Test Pass      Respiratory Rate 22      Set Rate 22      Mode P-CMV      Peep 5      PRESSURE CONTROL 26      Notified PA/NP      [employer] Other      VT Expiration 307     POCT Glucose    Collection Time: 02/15/22  5:34 AM   Result Value Ref Range    POC Glucose 174 (H) 65 - 105 mg/dL   Basic Metabolic Panel    Collection Time: 02/15/22  7:30 AM   Result Value Ref Range    Potassium 5.2 (H) 3.5 - 5.1 mEq/L    Chloride 100 98 - 107 mEq/L    Sodium 138 136 - 145 mEq/L    CO2 35 (H) 20 - 31 mEq/L    Glucose 151 (H) 74 - 106 mg/dl    BUN 27 (H) 9 - 23 mg/dl    Creatinine 2.44 0.10 - 1.02 mg/dl    GFR African American >60.0      GFR Non-African American >60      Calcium 9.3 8.7 - 10.4 mg/dl    Anion Gap 3 (L) 5 - 15 mmol/L   Cell Count with Differential, Body Fluid    Collection Time: 02/15/22 11:22 AM   Result Value Ref Range    Body Fluid Type PLEURAL      APPEARANCE, BODY FLUID HAZY      Body Fluid Wbc 325     Culture, Body Fluid    Collection Time: 02/15/22 11:22 AM    Specimen: Body fld   Result Value Ref Range    Gram Stain Result Many WBC'S  No Organisms Seen       Glucose, body fluid    Collection Time: 02/15/22 11:22 AM   Result Value Ref Range    Body Fluid Type PLEURAL      Glucose, Body Fluid 161 mg/dl   Lactate dehydrogenase, body fluid    Collection Time: 02/15/22 11:22 AM   Result Value Ref Range    LD, Fluid 93 U/L   Protein, body fluid    Collection Time: 02/15/22  11:22 AM   Result Value Ref Range    Protein, body fluid <2.0 gm/dl    Body Fluid Type PLEURAL     Culture, Fungus    Collection Time: 02/15/22 11:22 AM    Specimen: Other   Result Value Ref Range    Microscopic Observation No Fungal Elements Seen      Culture Result Culture In Progress, Weekly Updates To Follow     POCT Glucose    Collection Time: 02/15/22 12:23 PM   Result Value Ref Range    POC Glucose 173 (H) 65 - 105 mg/dL  Basic Metabolic Panel    Collection Time: 02/15/22  1:50 PM   Result Value Ref Range    Potassium 4.3 3.5 - 5.1 mEq/L    Chloride 99 98 - 107 mEq/L    Sodium 138 136 - 145 mEq/L    CO2 34 (H) 20 - 31 mEq/L    Glucose 140 (H) 74 - 106 mg/dl    BUN 24 (H) 9 - 23 mg/dl    Creatinine 1.61 0.96 - 1.02 mg/dl    GFR African American >60.0      GFR Non-African American >60      Calcium 9.0 8.7 - 10.4 mg/dl    Anion Gap 5 5 - 15 mmol/L   Lactate Dehydrogenase    Collection Time: 02/15/22  3:35 PM   Result Value Ref Range    LD 178 120 - 246 U/L   HIV 1/2 Ag/Ab, 4TH Generation,W Rflx Confirm    Collection Time: 02/15/22  3:35 PM   Result Value Ref Range    HIV 1/2 Antibody NONREACTIVE NONREACTIVE      HIV-1 P24 Ag NONREACTIVE NONREACTIVE     POCT Glucose    Collection Time: 02/15/22  4:49 PM   Result Value Ref Range    POC Glucose 160 (H) 65 - 105 mg/dL       Imaging:  [email or web address] @    Medications Reviewed:  Current Facility-Administered Medications   Medication Dose Route Frequency    sodium zirconium cyclosilicate (LOKELMA) oral suspension 10 g  10 g Oral TID    methylPREDNISolone sodium (PF) (SOLU-MEDROL PF) injection 40 mg  40 mg IntraVENous Q12H    sodium chloride flush 0.9 % injection 5-40 mL  5-40 mL IntraVENous BID    micafungin (MYCAMINE) 150 mg in sodium chloride 0.9 % 100 mL IVPB  150 mg IntraVENous Daily    heparin (porcine) injection 5,000 Units  5,000 Units SubCUTAneous BID    furosemide (LASIX) injection 20 mg  20 mg IntraVENous Daily    norepinephrine (LEVOPHED) 16 mg in dextrose 5% 250  mL  1-30 mcg/min IntraVENous Continuous    [Held by provider] fluticasone furoate-vilanterol (BREO ELLIPTA) 100-25 MCG/ACT inhaler 1 puff  1 puff Inhalation Daily    montelukast (SINGULAIR) tablet 10 mg  10 mg Oral Daily    rosuvastatin (CRESTOR) tablet 5 mg  5 mg Oral Nightly    ipratropium 0.5 mg-albuterol 2.5 mg (DUONEB) nebulizer solution 1 Dose  1 Dose Inhalation Q2H PRN    pantoprazole (PROTONIX) 40 mg in sodium chloride (PF) 0.9 % 10 mL injection  40 mg IntraVENous Daily    piperacillin-tazobactam (ZOSYN) 3,375 mg in sodium chloride 0.9 % 100 mL IVPB (mini-bag)  3,375 mg IntraVENous Q8H    insulin glargine (LANTUS) injection vial 1-100 Units  1-100 Units SubCUTAneous QHS    insulin lispro (HUMALOG) injection vial 1-100 Units  1-100 Units SubCUTAneous 4 times per day    sodium chloride flush 0.9 % injection 5-40 mL  5-40 mL IntraVENous 2 times per day    sodium chloride flush 0.9 % injection 5-40 mL  5-40 mL IntraVENous PRN    0.9 % sodium chloride infusion   IntraVENous PRN    ondansetron (ZOFRAN-ODT) disintegrating tablet 4 mg  4 mg Oral Q8H PRN    Or    ondansetron (ZOFRAN) injection 4 mg  4 mg IntraVENous Q6H PRN    polyethylene glycol (GLYCOLAX) packet 17 g  17 g Oral Daily PRN    acetaminophen (TYLENOL) tablet  650 mg  650 mg Oral Q6H PRN    Or    acetaminophen (TYLENOL) suppository 650 mg  650 mg Rectal Q6H PRN    midazolam (VERSED) 100mg /155mL in NS infusion  1-10 mg/hr IntraVENous Continuous    fentanyl (SUBLIMAZE) infusion 1000 mcg/159mL  25-200 mcg/hr IntraVENous Continuous    doxycycline (VIBRAMYCIN) 100 mg in sodium chloride 0.9 % 100 mL IVPB (mini-bag)  100 mg IntraVENous Q12H    vancomycin (VANCOCIN) 1250 mg in sodium chloride 0.9% 250 mL IVPB  1,250 mg IntraVENous Q12H    Vancomycin Trough Reminder  1 each Other Once    *Vancomycin: Pharmacy to Dose  1 each Other RX Placeholder    vasopressin (VASOSTRICT) 20 units in dextrose 5% 100 mL infusion  0.03 Units/min IntraVENous Continuous     phenylephrine (NEO-SYNEPHRINE) 50 mg/250 mL infusion  10-300 mcg/min IntraVENous Continuous    glucagon (rDNA) injection 1 mg  1 mg IntraMUSCular PRN    dextrose 50 % IV solution  20-30 mL IntraVENous PRN    budesonide (PULMICORT) nebulizer suspension 500 mcg  0.5 mg Nebulization BID RT    acetylcysteine (MUCOMYST) 10 % solution 400 mg  4 mL Inhalation TID RT    ipratropium 0.5 mg-albuterol 2.5 mg (DUONEB) nebulizer solution 1 Dose  1 Dose Inhalation TID RT         Dragon medical dictation software was used for portions of this report. Unintended errors may occur.   [name], MD  P# [phone]  February 15, 2022

## 2022-02-15 NOTE — Progress Notes (Signed)
Informal bedside swallow eval performed with ice chips and applesauce. Patient showed no signs of distress, no difficulties swallowing, and maintained oxygen saturation >92%.

## 2022-02-15 NOTE — Procedures (Signed)
US guided thoracentesis  :  diagnostic and therapeutic.      Consent:  Patient's husband.   Indication:  L moderate effusion - sepsis - resp failure - recent chest tubes placed in [facility].  Concerns about infection.       Technique.    With continue dynamic US guidance we preped the area and an [place] was done of the L chest revealing the effusion (Picture) and the area was marked.  Under full sterile technique we accessed the fluid after numbing with topical lido first and later accessed the fluid with the 10 cm one-step catheter and fluid was collected for sampling and later pumped in the bag for further analysis.  Catheter withdrawn when no more fluid recovered and overall small effusion remained on bedside US.  Pt tolerated procedure. CXR ordered.    Total of 650 cc of yellow slightly turbid fluid collected.      Vickey Huger, MD

## 2022-02-15 NOTE — Progress Notes (Addendum)
In conversation with patients husband he explains that patient was here a few months ago then recently she was at [facility] . Upon d/c from [facility] they set her up with h.h  thru [facility] of pt ot speech and h.h.a. he states that she also has a palliatice care dr n.po Hyacinth Meeker at [phone] and this currently is not affecting the h.h ordered but they are working with them. I will write resumption orders AND LINK TO [facility] IN CC LINK. AT D/C IF THIS DISPO IS THE 1 WE USE. THE ORDERS WILL HAVE TO BE FAXED OVER TO [facility] . Patients husband mentions that at d/c the medic transporting needs to have 2 strong gentlemen because patient stays on 2nd floor and needs to be transported up 13 steps and previos crews with smaller people have struggled.
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
## 2022-02-15 NOTE — Progress Notes (Signed)
[facility] Dosing Services: Vancomycin    Vancomycin consult requested by Ancil Boozer, MD, for this 71 y.o. female for the indication of HAP and sepsis.    Current Microbial Regimen:  Vancomycin (Start Date 9/3; Day # 2)  Piperacillin-tazobactam (Start Date 9/3; Day # 2)  Doxycycline (Start Date 9/4; Day # 1)  Micafungin (Start Date 9/5; Day # 0)    Previous Antimicrobial Therapy:  None    Significant Positive Cultures:   9/3 blood culture shows growth of GPC in pairs, yeast, and nonhemolytic Streptococcus species and 9/4 urine culture shows presence of mixed flora and GNB    Height 5\' 7"  (1.702 m)   Weight 215 lb 6.2 oz (97.7 kg)   Creatinine Clearance Estimated Creatinine Clearance: 84 mL/min (based on SCr of 0.76 mg/dL).   Blood Urea Nitrogen Lab Results   Component Value Date/Time    BUN 24 02/15/2022 01:50 PM      White Blood Count Lab Results   Component Value Date/Time    WBC 12.4 02/15/2022 03:31 AM      Hemoglobin and Hematocrit Lab Results   Component Value Date/Time    HGB 7.4 02/15/2022 03:31 AM      Platelets Lab Results   Component Value Date/Time    PLT 127 02/15/2022 03:31 AM      Temperature (in past 24 hours) Temp (24hrs), Avg:97.8 F (36.6 C), Min:93.6 F (34.2 C), Max:98.8 F (37.1 C)       Vancomycin Goal Level: AUC/MIC = 400-600 mg*hr/L    Special Conditions Affecting Dosing: None    Dosing Calculator Used: Excel-based calculator    Plan: Vancomycin trough returned supratherapeutic at 38.3 after roughly 8 hours since the last dose of vancomycin on a 1250 mg q12h regimen. Stopped scheduled vancomycin for now and will draw a 24-hour random level tomorrow at noon before reevaluating dosing.    Date and Time of Next Level: 9/6 at 1200    Date and Time Dose & Interval Measured (mcg/mL) Predicted AUC/MIC (mcg*hr/mL)   9/5 at 1855 1250 mg q12h 38.3                  Pharmacy will follow daily and make changes to dose and/or frequency as clinically warranted.    Thank you for the  consult,    11/6, PharmD, MBA, Maralyn Sago    Tel: [phone]  E-mail: [phone]

## 2022-02-15 NOTE — Care Coordination-Inpatient (Signed)
02/15/22 2020   Service Assessment   Patient Orientation Unable to Assess   Cognition Other (see comment)   History Provided By [facility]   Primary Caregiver Spouse   Support Systems Spouse/Significant Other   Patient's Healthcare Decision Maker is: Legal Next of Kin   PCP Verified by CM Yes   Prior Functional Level Assistance with the following:;Bathing;Dressing;Toileting;Feeding;Cooking;Housework;Shopping;Mobility   Current Functional Level Assistance with the following:;Bathing;Dressing;Toileting;Feeding;Cooking;Housework;Shopping;Mobility   Can patient return to prior living arrangement Unknown at present   Ability to make needs known: Poor   Family able to assist with home care needs: Yes   Would you like for me to discuss the discharge plan with any other family members/significant others, and if so, who? Yes  Psychologist, sport and exercise)   Financial Resources [employer] Resources None   Social/Functional History   Lives With Spouse   Type of Home House   Home Layout Two level   Bathroom Shower/Tub   (CHANGED IN BED)   Designer, multimedia   (changed in bed)   Engineer, water Walker, rolling;Wheelchair-manual;Hospital bed;Oxygen  (hoyer lift, nebulizer o2 at 3.5 l)   Receives Help From Family;Personal care attendant   ADL Assistance Needs assistance   Homemaking Assistance Needs assistance   Ambulation Assistance Non-ambulatory   Transfer Assistance Needs assistance   Active Driver No   Mode of Transportation Other  International aid/development worker)   Occupation Other(comment)   Discharge Planning   Type of Residence Other (Comment)  (tbd)   Living Arrangements Spouse/Significant Other   Current Services Prior To Admission Home Care;Oxygen Therapy   Potential Assistance Needed Other (Comment)  (tbd)   DME Ordered? Other (comment)  (tbd)   Potential Assistance Purchasing Medications No   Type of Home Care Services Aide Services;OT;PT;RT;Nursing Services;Skilled Therapy   Patient  expects to be discharged to: Other (comment)  (tbd)   One/Two Story Residence Two story   # of Interior Steps 13   Lift Chair Available No   History of falls? 1   Services At/After Discharge   Transition of Care Consult (CM Consult) Home Health   Internal Home Health No   Reason Outside Agency Chosen Patient already serviced by other home care/hospice agency   Services At/After Discharge Home Health   [facility] Resource Information Provided? No   Mode of Transport at Discharge BLS   Confirm Follow Up Transport Other (see comment)  International aid/development worker)   Condition of Participation: Discharge Planning   The Patient and/or Patient Representative was provided with a Choice of Provider? Patient Representative   Name of the Patient Representative who was provided with the Choice of Provider and agrees with the Discharge Plan?  husband

## 2022-02-15 NOTE — Progress Notes (Signed)
Daily Ventilator Progress Note      Name:  [name] [name]  Gender:  female    DOB:  Jun 23, 1950 Age:  71 y.o.    MRN:  [id number]  Admit Date:  02/13/2022      Primary Encounter Diagnosis:       The primary encounter diagnosis was Acute respiratory failure with hypoxia and hypercapnia (HCC). Diagnoses of Septic shock (HCC), Acute hyperkalemia, Hyperglycemia, and Community acquired pneumonia of left lung, unspecified part of lung were also pertinent to this visit.     Active Problem List:     Patient Active Problem List   Diagnosis    Obtunded    Narcotic bowel syndrome (HCC)    Hyperkalemia    Accelerated hypertension    UTI (urinary tract infection)    Leukocytosis    Acute hyperglycemia    Uncontrolled type 2 diabetes mellitus with hyperglycemia (HCC)    Sepsis (HCC)    Lumbar and sacral osteoarthritis    Acute colitis    Severe hypertension    Acute-on-chronic kidney injury (HCC)    COPD with acute exacerbation (HCC)    Lactic acidosis    Severe headache    Abdominal pain    Septic shock (HCC)    Type 2 diabetes mellitus with diabetic neuropathy (HCC)    Metabolic encephalopathy    Chronic respiratory failure with hypoxia (HCC)    Gastritis    COPD exacerbation (HCC)    Osteoarthritis of hips, bilateral    Asthma with acute exacerbation    Chronic pain syndrome    Headache    Acute hypoxemic respiratory failure (HCC)    Cannabinoid hyperemesis syndrome    PTSD (post-traumatic stress disorder)    Dyspnea    Type 2 diabetes mellitus with nephropathy (HCC)    Spondylosis of lumbar region without myelopathy or radiculopathy    SIRS (systemic inflammatory response syndrome) (HCC)    Nausea & vomiting    Asthma exacerbation    Dehydration    Sacroiliitis (HCC)    Acute renal failure (ARF) (HCC)    Chest pain    Acute CHF (congestive heart failure) (HCC)    Respiratory failure (HCC)    Acute respiratory failure with hypoxia and hypercapnia (HCC)             LOS: 1 day     ICU LOS:  19h     Vital signs in last 12 hours:   Patient  Vitals for the past 12 hrs:   Temp Pulse Resp BP SpO2   02/15/22 0004 -- -- 22 -- --   02/14/22 2318 98.6 F (37 C) -- -- -- --   02/14/22 2245 98.8 F (37.1 C) 73 22 -- 99 %   02/14/22 2230 98.6 F (37 C) 67 22 (!) 150/102 99 %   02/14/22 2215 98.6 F (37 C) 61 22 129/85 99 %   02/14/22 2200 98.4 F (36.9 C) 63 22 122/81 99 %   02/14/22 2145 98.4 F (36.9 C) 64 22 121/82 100 %   02/14/22 2130 98.4 F (36.9 C) 64 22 119/79 100 %   02/14/22 2115 98.4 F (36.9 C) 68 22 113/76 100 %   02/14/22 2100 98.6 F (37 C) 69 22 109/74 100 %   02/14/22 2045 98.4 F (36.9 C) 70 22 103/70 100 %   02/14/22 2030 98.4 F (36.9 C) 73 22 116/75 100 %   02/14/22 2015 98.4 F (36.9 C) 77 22 116/76  100 %   02/14/22 2000 98.4 F (36.9 C) 79 20 -- 100 %   02/14/22 1956 98.4 F (36.9 C) 79 19 123/81 100 %   02/14/22 1941 98.6 F (37 C) 81 22 -- 100 %   02/14/22 1926 99.7 F (37.6 C) 85 19 123/73 100 %   02/14/22 1901 99.5 F (37.5 C) 88 15 (!) 143/90 99 %   02/14/22 1831 99.9 F (37.7 C) 62 20 108/78 100 %   02/14/22 1801 100 F (37.8 C) 66 22 103/78 100 %   02/14/22 1731 100 F (37.8 C) 70 22 98/71 100 %   02/14/22 1701 100.4 F (38 C) 77 22 117/81 100 %   02/14/22 1631 (!) 100.6 F (38.1 C) 76 22 120/79 100 %   02/14/22 1601 (!) 100.9 F (38.3 C) 72 22 122/89 100 %   02/14/22 1531 (!) 101.1 F (38.4 C) 71 22 (!) 126/98 100 %   02/14/22 1501 (!) 101.1 F (38.4 C) 76 22 108/83 100 %   02/14/22 1500 (!) 100.6 F (38.1 C) -- -- -- --   02/14/22 1330 (!) 101.8 F (38.8 C) 77 22 105/78 100 %   02/14/22 1310 (!) 100.9 F (38.3 C) -- -- -- --   02/14/22 1300 (!) 102 F (38.9 C) 83 22 101/78 100 %   02/14/22 1230 (!) 102.2 F (39 C) 83 22 90/66 100 %   02/14/22 1222 (!) 101.3 F (38.5 C) -- -- -- --   02/14/22 1210 (!) 102.2 F (39 C) 81 22 93/69 100 %          ABG & LACTATE Results (Last 7):     Recent Labs     02/14/22  1639 02/14/22  0509 02/14/22  0205 02/14/22  0042   PH 7.427 7.388 7.190* 7.033*   PCO2 56.6*  65.2* 102.3* >130.0   PO2 109.0* 53.0* 72.0* 175.0*   HCO3 37.3* 39.3* 39.1* <>   BE 13* 14* 11* <>   O2SAT 98.0 85.0* 88.0* <>   DRAWSITE L Radial L Radial L Radial L Radial   SMAPLE Art Art Art Art   FIO2 50 40 50 70   DS Vent Vent Vent Vent   MODE P-CMV P-CMV P-CMV P-CMV       Recent Labs     02/14/22  1639 02/14/22  1106 02/14/22  0509 02/14/22  0506 02/14/22  0205 02/14/22  0042 02/13/22  2345   LACTATE 1.15 1.7 1.23 3.3* 2.20* 3.81* 5.4*        ARDS is classified according to the degree of hypoxemia (PaO2/FiO2 ratio),  mild (PaO2/FiO2, 201-300), moderate (PaO2/FiO2, 101-200), and severe (PaO2/FiO2???100)       Airway:     ETT  (Active)   Placement Date/Time: 02/13/22 2350   Present on Admission/Arrival: No  Placed By: (c) In ED  Placement Verified By: Colorimetric ETCO2 device  Preoxygenation: Yes  Mask Ventilation: Ventilated by mask (1)  Airway Tube Size: 7.5 mm  Location: Oral  Ins...   Number of days: 1         ETT  (Active)   Secured At 23 cm 02/15/22 0004   Measured From Lips 02/15/22 0004   ETT Placement Center 02/15/22 0004   Secured By Commercial tube holder 02/15/22 0004   Site Assessment Dry 02/15/22 0004   Tie/Holder Changed No 02/14/22 1529         Ventilator Settings:   Vent Mode: AC/PC  Pressure Ordered: 26  Resp Rate (Set): 22 bmp  PEEP/CPAP (cmH2O): 5  FiO2 : (S) 45 %  Insp Time (sec): 0.65 sec    Vent Patient Data (Readings)  Vt (Measured): 316 mL  Peak Inspiratory Pressure (cmH2O): 33 cmH2O  Rate Measured: 22 br/min  Minute Volume (L/min): 6.5 Liters  Peak Inspiratory Flow (lpm): 36.1 L/sec  Peak Expiratory Flow (lpm): 35.3 L/min  Mean Airway Pressure (cmH2O): 11 cmH20  Plateau Pressure (cm H2O): 21.8 cm H2O  Driving Pressure: 62.2  Inspiratory Time: 0.65 sec  I:E Ratio: 1:3.2  Flow Sensitivity: 3 L/min  Static Compliance (L/cm H2O): 42  Airway Resistance: 22  I Time/ I Time %: 25 s  Tube Compensation: 100 %  Backup Apnea: On  Backup Rate: 22 Breaths Per Minute     Vent Alarm Settings  Low  Pressure (cmH2O): 5 cmH2O  High Pressure (cmH2O): 50 cmH2O  Low Minute Volume (lpm): 2 L/min  High Minute Volume (lpm): 20 L/min  Low Exhaled Vt (ml): 200 mL  High Exhaled Vt (ml): 1000 mL  RR Low (bpm): 8  RR High (bpm): 40 br/min  Apnea (secs): 20 secs      Breath Sounds:   Breath Sounds Bilateral: Diminished  Right Upper Lobe: Diminished  Right Middle Lobe: Diminished  Right Lower Lobe: Diminished  Left Upper Lobe: Diminished  Left Lower Lobe: Diminished      Secretions:     Suction: ET Tube, Oral  Sputum Method Obtained: Endotracheal  Sputum Amount: Small  Sputum Color/Odor: Shelva Majestic  Sputum Consistency: Thick      Imaging:   XR CHEST PORTABLE 02/14/2022    Narrative  EXAM: XR CHEST PORTABLE  INDICATION: central line  COMPARISON: 2023  WORKSTATION ID: [id number]    FINDINGS:    SUPPORT DEVICES: Stable position of endotracheal and enteric tube partially  imaged. Right central venous catheter terminates at the mid SVC.    LUNGS/PLEURA: No pneumothorax. Stable lung findings.    HEART/MEDIASTINUM: Cardiomediastinal silhouette is normal.    OTHER: None.    Impression  IMPRESSION: No pneumothorax. Stable lung findings.    Electronically signed by: Albin Felling, MD 02/14/2022 1:25 AM EDT     XR CHEST 1 VIEW 05/18/2021    Narrative  Chest    Indication: SOB  Comparison: 05/17/2021    Findings:    AP portable upright view of the chest demonstrates that the cardiac silhouette  is not  enlarged. Stable left upper lobe bulla. Interval worsening of right  lower lobe airspace disease. Stable left mid and basilar airspace disease.    Impression  Impression:    Worsening right lower lobe airspace disease. Stable left lung airspace disease    Electronically signed by: Jodi Mourning, MD 05/18/2021 9:42 AM EST    No results found for this or any previous visit from the past 365 days.    No results found for this or any previous visit from the past 365 days.    CT Chest Pulmonary Embolism W Contrast 02/14/2022    Narrative  EXAM:   CT CHEST PULMONARY EMBOLISM W CONTRAST    INDICATION: Chest Pain    COMPARISON: 2023    TECHNIQUE: Multiplanar CT angiography of the chest was performed with  intravenous contrast. 3D MIP reformats were performed.    All CT exams at this facility use one or more dose reduction techniques  including automatic exposure control, mA/kV adjustment per patient's size, or  iterative reconstruction technique.  WORKSTATION  ID: [id number]    FINDINGS:    SUPPORT DEVICES: Endotracheal tube and enteric tube in good position.    CONTRAST BOLUS: Satisfactory.    PULMONARY ARTERIES: No  evidence of pulmonary embolism.    LUNGS: Biapical bullae and emphysematous changes. Moderate left pleural effusion  and significant lingular and left lower lobe atelectasis. Lingular atelectasis  appears unchanged. Minimal right lower lobe atelectasis. No pneumonia.    CENTRAL AIRWAYS: Partial compression or occlusion of left lower lobe bronchi.  There is right lower lobe distal Airways thickening noted best appreciated on  axial image 92 series 5. Nonspecific.    PLEURA: See above    MEDIASTINUM AND HILA: No lymphadenopathy.    HEART: Cardiomegaly. No pericardial effusion.    VESSELS: Normal. No dissection or aneurysm.    CHEST WALL: Unremarkable.    BONES: No acute fracture or aggressive osseous lesion.    UPPER ABDOMEN: Unremarkable.    Impression  IMPRESSION:  1. Moderate left pleural effusion and significant left lung atelectasis.  2. No pulmonary embolism.    Electronically signed by: Albin Felling, MD 02/14/2022 5:07 AM EDT         Weaning Trial:      [place] Agitation Sedation Scale (RASS): Alert & Calm-Spontaneously pays attention to caregiver      Weaning Parameters:                              Summary:   Patient is on the vent no respiratory distress noted at this time, resting comfortable on current vent setting. She is alert and awake, she has a cough with suction, small to moderate thick tan secretion via ett and oral.          Plan:     Continue to monitor patient on P-CMV on the ventilator      Kristen Bushway T. Rawson Minix, RT  February 15, 2022
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
## 2022-02-16 ENCOUNTER — Inpatient Hospital Stay: Admit: 2022-02-16 | Payer: MEDICARE | Primary: Physician Assistant

## 2022-02-16 LAB — CBC
Hematocrit: 27.3 % — ABNORMAL LOW (ref 35.0–47.0)
Hemoglobin: 8.4 gm/dl — ABNORMAL LOW (ref 11.0–16.0)
MCH: 27 pg (ref 25.4–34.6)
MCHC: 30.8 gm/dl (ref 30.0–36.0)
MCV: 87.8 fL (ref 80.0–98.0)
MPV: 12.5 fL — ABNORMAL HIGH (ref 6.0–10.0)
Platelets: 173 10*3/uL (ref 140–450)
RBC: 3.11 M/uL — ABNORMAL LOW (ref 3.60–5.20)
RDW: 55.3 — ABNORMAL HIGH (ref 36.4–46.3)
WBC: 12.4 10*3/uL — ABNORMAL HIGH (ref 4.0–11.0)

## 2022-02-16 LAB — COMPREHENSIVE METABOLIC PANEL
ALT: 10 U/L (ref 10–49)
AST: 8 U/L (ref 0.0–33.9)
Albumin: 3.2 gm/dl — ABNORMAL LOW (ref 3.4–5.0)
Alkaline Phosphatase: 58 U/L (ref 46–116)
Anion Gap: 4 mmol/L — ABNORMAL LOW (ref 5–15)
BUN: 19 mg/dl (ref 9–23)
CO2: 35 mEq/L — ABNORMAL HIGH (ref 20–31)
Calcium: 9.1 mg/dl (ref 8.7–10.4)
Chloride: 101 mEq/L (ref 98–107)
Creatinine: 0.68 mg/dl (ref 0.55–1.02)
GFR African American: >60
GFR Non-African American: >60
Glucose: 158 mg/dl — ABNORMAL HIGH (ref 74–106)
Potassium: 3.8 mEq/L (ref 3.5–5.1)
Sodium: 140 mEq/L (ref 136–145)
Total Bilirubin: 0.5 mg/dl (ref 0.30–1.20)
Total Protein: 6 gm/dl (ref 5.7–8.2)

## 2022-02-16 LAB — IRON AND TIBC
% SATURATION: 7 % — ABNORMAL LOW (ref 20–45)
Iron: 16 ug/dL — ABNORMAL LOW (ref 50–170)
TIBC: 230 ug/dL — ABNORMAL LOW (ref 250–425)

## 2022-02-16 LAB — BASIC METABOLIC PANEL
Anion Gap: 7 mmol/L (ref 5–15)
BUN: 20 mg/dl (ref 9–23)
CO2: 34 mEq/L — ABNORMAL HIGH (ref 20–31)
Calcium: 9.1 mg/dl (ref 8.7–10.4)
Chloride: 99 mEq/L (ref 98–107)
Creatinine: 0.69 mg/dl (ref 0.55–1.02)
GFR African American: >60
GFR Non-African American: >60
Glucose: 145 mg/dl — ABNORMAL HIGH (ref 74–106)
Potassium: 3.6 mEq/L (ref 3.5–5.1)
Sodium: 140 mEq/L (ref 136–145)

## 2022-02-16 LAB — AMMONIA: Ammonia: 10 umol/L — ABNORMAL LOW (ref 11.2–31.7)

## 2022-02-16 LAB — FOLATE: Folate: 20.33 ng/ml (ref 5.38–24.00)

## 2022-02-16 LAB — RETICULOCYTES
Retic Hemoglobin: 29.5 pg (ref 28.2–36.6)
Retic: 0.081 M/uL (ref 0.020–0.110)
Reticulocyte Count: 2.59 % — ABNORMAL HIGH (ref 0.50–1.50)

## 2022-02-16 LAB — POCT GLUCOSE
POC Glucose: 201 mg/dL — ABNORMAL HIGH (ref 65–105)
POC Glucose: 177 mg/dL — ABNORMAL HIGH (ref 65–105)
POC Glucose: 173 mg/dL — ABNORMAL HIGH (ref 65–105)
POC Glucose: 299 mg/dL — ABNORMAL HIGH (ref 65–105)

## 2022-02-16 LAB — PHOSPHORUS: Phosphorus: 4.4 mg/dL (ref 2.4–5.1)

## 2022-02-16 LAB — VANCOMYCIN LEVEL, TROUGH
Vancomycin Tr: 38.3 ug/mL (ref 5.0–10.0)
Vancomycin Tr: 24.5 ug/mL (ref 5.0–10.0)

## 2022-02-16 LAB — VITAMIN B12: Vitamin B-12: 917 pg/ml — ABNORMAL HIGH (ref 211–911)

## 2022-02-16 LAB — MAGNESIUM: Magnesium: 1.7 mg/dL (ref 1.6–2.6)

## 2022-02-16 LAB — FERRITIN: Ferritin: 175.6 ng/ml (ref 7.3–270.7)

## 2022-02-16 MED ORDER — PROCHLORPERAZINE EDISYLATE 10 MG/2ML IJ SOLN
10 MG/2ML | Freq: Four times a day (QID) | INTRAMUSCULAR | Status: AC | PRN
Start: 2022-02-16 — End: ?
  Administered 2022-02-16: 21:00:00 10 mg via INTRAVENOUS

## 2022-02-16 MED ORDER — ERYTHROMYCIN 5 MG/GM OP OINT
5 MG/GM | Freq: Three times a day (TID) | OPHTHALMIC | Status: AC
Start: 2022-02-16 — End: ?
  Administered 2022-02-16 – 2022-02-26 (×30): via OPHTHALMIC

## 2022-02-16 MED ORDER — SILVER SULFADIAZINE 1 % EX CREA
1 % | Freq: Two times a day (BID) | CUTANEOUS | Status: AC
Start: 2022-02-16 — End: ?
  Administered 2022-02-16 – 2022-02-26 (×19): via TOPICAL

## 2022-02-16 MED ORDER — METHYLPREDNISOLONE NA SUC (PF) 40 MG IJ SOLR
40 MG | Freq: Every day | INTRAMUSCULAR | Status: AC
Start: 2022-02-16 — End: 2022-02-18
  Administered 2022-02-17 – 2022-02-18 (×2): 40 mg via INTRAVENOUS

## 2022-02-16 MED ORDER — INSULIN LISPRO 100 UNIT/ML IJ SOLN
100 UNIT/ML | Freq: Four times a day (QID) | INTRAMUSCULAR | Status: AC
Start: 2022-02-16 — End: ?
  Administered 2022-02-16: 22:00:00 7 [IU] via SUBCUTANEOUS
  Administered 2022-02-17: 22:00:00 8 [IU] via SUBCUTANEOUS
  Administered 2022-02-17: 13:00:00 5 [IU] via SUBCUTANEOUS
  Administered 2022-02-17: 16:00:00 6 [IU] via SUBCUTANEOUS
  Administered 2022-02-18: 01:00:00 1 [IU] via SUBCUTANEOUS
  Administered 2022-02-18: 16:00:00 9 [IU] via SUBCUTANEOUS
  Administered 2022-02-18: 21:00:00 11 [IU] via SUBCUTANEOUS
  Administered 2022-02-19: 17:00:00 10 [IU] via SUBCUTANEOUS
  Administered 2022-02-19: 21:00:00 11 [IU] via SUBCUTANEOUS
  Administered 2022-02-19: 12:00:00 4 [IU] via SUBCUTANEOUS
  Administered 2022-02-20: 13:00:00 7 [IU] via SUBCUTANEOUS
  Administered 2022-02-20: 22:00:00 9 [IU] via SUBCUTANEOUS
  Administered 2022-02-21: 18:00:00 12 [IU] via SUBCUTANEOUS
  Administered 2022-02-21 (×2): 1 [IU] via SUBCUTANEOUS
  Administered 2022-02-21: 22:00:00 12 [IU] via SUBCUTANEOUS
  Administered 2022-02-22: 21:00:00 14 [IU] via SUBCUTANEOUS
  Administered 2022-02-22: 01:00:00 1 [IU] via SUBCUTANEOUS
  Administered 2022-02-22: 17:00:00 15 [IU] via SUBCUTANEOUS
  Administered 2022-02-22: 13:00:00 8 [IU] via SUBCUTANEOUS
  Administered 2022-02-23: 17:00:00 15 [IU] via SUBCUTANEOUS
  Administered 2022-02-23: 22:00:00 14 [IU] via SUBCUTANEOUS
  Administered 2022-02-23: 14:00:00 8 [IU] via SUBCUTANEOUS
  Administered 2022-02-24: 17:00:00 13 [IU] via SUBCUTANEOUS
  Administered 2022-02-24: 22:00:00 12 [IU] via SUBCUTANEOUS
  Administered 2022-02-24: 13:00:00 8 [IU] via SUBCUTANEOUS
  Administered 2022-02-25: 13:00:00 11 [IU] via SUBCUTANEOUS
  Administered 2022-02-25: 22:00:00 15 [IU] via SUBCUTANEOUS
  Administered 2022-02-25: 16:00:00 13 [IU] via SUBCUTANEOUS
  Administered 2022-02-25 – 2022-02-26 (×2): 1 [IU] via SUBCUTANEOUS
  Administered 2022-02-26: 16:00:00 3 [IU] via SUBCUTANEOUS

## 2022-02-16 MED ORDER — QUETIAPINE FUMARATE 25 MG PO TABS
25 MG | Freq: Every evening | ORAL | Status: AC
Start: 2022-02-16 — End: ?
  Administered 2022-02-16 – 2022-02-24 (×8): 25 mg via ORAL

## 2022-02-16 MED ORDER — VANCOMYCIN INTERMITTENT DOSING (PLACEHOLDER)
Freq: Once | INTRAVENOUS | Status: AC
Start: 2022-02-16 — End: 2022-02-16
  Administered 2022-02-16: 17:00:00 1

## 2022-02-16 MED FILL — SODIUM CHLORIDE FLUSH 0.9 % IV SOLN: 0.9 % | INTRAVENOUS | Qty: 10

## 2022-02-16 MED FILL — PROCHLORPERAZINE EDISYLATE 10 MG/2ML IJ SOLN: 10 MG/2ML | INTRAMUSCULAR | Qty: 2

## 2022-02-16 MED FILL — SOLU-MEDROL (PF) 40 MG IJ SOLR: 40 MG | INTRAMUSCULAR | Qty: 40

## 2022-02-16 MED FILL — MONTELUKAST SODIUM 10 MG PO TABS: 10 MG | ORAL | Qty: 1

## 2022-02-16 MED FILL — HEPARIN SODIUM (PORCINE) 5000 UNIT/ML IJ SOLN: 5000 UNIT/ML | INTRAMUSCULAR | Qty: 1

## 2022-02-16 MED FILL — LOKELMA 10 G PO PACK: 10 g | ORAL | Qty: 1

## 2022-02-16 MED FILL — IPRATROPIUM-ALBUTEROL 0.5-2.5 (3) MG/3ML IN SOLN: RESPIRATORY_TRACT | Qty: 3

## 2022-02-16 MED FILL — ROSUVASTATIN CALCIUM 10 MG PO TABS: 10 MG | ORAL | Qty: 1

## 2022-02-16 MED FILL — ACETYLCYSTEINE 10 % IN SOLN: 10 % | RESPIRATORY_TRACT | Qty: 4

## 2022-02-16 MED FILL — PIPERACILLIN SOD-TAZOBACTAM SO 3.375 (3-0.375) G IV SOLR: 3.375 (3-0.375) g | INTRAVENOUS | Qty: 3375

## 2022-02-16 MED FILL — VANCOMYCIN INTERMITTENT DOSING (PLACEHOLDER): INTRAVENOUS | Qty: 1

## 2022-02-16 MED FILL — ACETAMINOPHEN 325 MG PO TABS: 325 MG | ORAL | Qty: 2

## 2022-02-16 MED FILL — MICAFUNGIN SODIUM 100 MG IV SOLR: 100 MG | INTRAVENOUS | Qty: 100

## 2022-02-16 MED FILL — SILVER SULFADIAZINE 1 % EX CREA: 1 % | CUTANEOUS | Qty: 50

## 2022-02-16 MED FILL — DOXY 100 100 MG IV SOLR: 100 MG | INTRAVENOUS | Qty: 100

## 2022-02-16 MED FILL — QUETIAPINE FUMARATE 25 MG PO TABS: 25 MG | ORAL | Qty: 1

## 2022-02-16 MED FILL — BUDESONIDE 0.5 MG/2ML IN SUSP: 0.5 MG/2ML | RESPIRATORY_TRACT | Qty: 2

## 2022-02-16 MED FILL — ERYTHROMYCIN 5 MG/GM OP OINT: 5 MG/GM | OPHTHALMIC | Qty: 1

## 2022-02-16 MED FILL — ONDANSETRON HCL 4 MG/2ML IJ SOLN: 4 MG/2ML | INTRAMUSCULAR | Qty: 2

## 2022-02-16 MED FILL — FUROSEMIDE 10 MG/ML IJ SOLN: 10 MG/ML | INTRAMUSCULAR | Qty: 2

## 2022-02-16 MED FILL — PANTOPRAZOLE SODIUM 40 MG IV SOLR: 40 MG | INTRAVENOUS | Qty: 40

## 2022-02-16 NOTE — ACP (Advance Care Planning) (Signed)
Health Care Decision Maker is: [name] [name] [phone]  Pt is not able to make informed decision making for her health care at this time due to metabolic encephalopathy, medical debility, d/w PCCM team. As per [place] AD for health care, [name] has been appointed as health care decision maker.      Code Status:  Full Code     Goals of Care:   Pt is extubated 02/15/22. Pt is awake, able to communicate, oriented that she is at the hospital, can not tell me month or year, can not tell me why she is at the hospital, or her current medical condition, treatment plan. She is deferring all decision making to her husband. Called [name], [phone], GOC d/w him:       Full code    No limitation of care at this point, no de escalation of care.      Pt is extubated 02/15/22. Awake, stated she is feeling tired, able to tell me that she is at the hospital, can not tell me the name of it. Could not tell me what month, year this is. Could not tell me what is the reason she is at the hospital, stated she can not think of anything. She was able to tell me her husband's name, [name]. She asked me to talk to him and he will be here soon. Called [name]. Discussed pt's condition, feeling still tired, confused, extubated yesterday. He stated, pt has difficulty with memory over past few months, more after recurrent hospitalization and he is the one who manages all paperwork. I discussed about AD document attached in Epic from 2016, stated pt would not want intubation, cardiac resuscitation. [name] clarified that both pt and him had conversation that they would want to go for a trial of 2 weeks with ventilation and resuscitation, and according to him that is indicated in AD, page 2. [name] stated he will be here at the hospital later today. I have made copy of AD document. We will review it with him, and request any updated one if he has any, clarifying the 2 weeks time limit as  he mentioned. Pt is not able to meaningfully participate in medical decision making at this time, due to underlying hypoxic hypercapnic respi failure, metabolic encephalopathy, sepsis as d/w Dr. Madelaine Etienne. Pt to be re evaluated for medical decision making capacity depending on how she is tolerating current medical treatment.  [name] is pt's health care agent as per [place] AD for health care, he has elected full code status for [name] [name].   Discussed risk vs benefits of full code, what the clinical picture of a resuscitation event entails, complications that can arise from resuscitation efforts to include trauma (fractured ribs, punctured lungs), anoxic/hypoxic brain injury, likelihood of requiring intubation and ventilation support, and overall decreased clinical and functional outcomes s/p resuscitation efforts that are likely to be poor in light of pt's advanced COPD, atelectasis, bullous emphysema, multiple co morbidity, medical debility. He verbalized the understanding, and elected full code. He requested out pt palliative care consultation when pt's condition improves and when pt is ready to be discharged home. Pt has been followed by NP, [name] and they would want to continue her services. Requested OP PC consultation.      Disposition:  TBD  Husband would want pt to go home with [facility] and OP PC when stable enough for discharge.     Raechel Ache, MD  [place]  o. [phone]  f. [phone]
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
## 2022-02-16 NOTE — Progress Notes (Signed)
Pt c/o pain on inspiration, RR 25-30 coughing up significant amount of sputum, lung sounds clear but diminished, RT at bedside with duoneb. Sats 97% on [place]

## 2022-02-16 NOTE — Progress Notes (Signed)
Spoke with PICC team regarding obtaining alternative access for central line removal. No further access obtainable, central line to remain in place.
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
## 2022-02-16 NOTE — Progress Notes (Signed)
Palliative Care Social Work Note    Chart reviewed, discussed with Palliative Care MD Dr. Posey Pronto. Verbal request to follow up on verifying Advance Directive documents. There is a copy of an AD in chart dated 09/01/2014 naming patient's spouse Mr. Naoma Diener as primary Media planner. Met with patient at bedside, introduced self and SW role, patient's spouse was not at bedside. Patient was not sure if the current AD on file is the most current, though did confirm she assigned her spouse as Media planner. She mentioned her husband would be visiting this afternoon but not sure when.     Will attempt to meet with spouse when he visits to confirm copy of AD on file is current.    Lendon Colonel, MSW   Social Worker  Palliative Care  p. [phone]

## 2022-02-16 NOTE — Plan of Care (Signed)
Problem: SLP Adult - Impaired Swallowing  Goal: Effective swallowing  Description: Short term Goals: 3-4x/wk x 1 week  Patient will:  1. Tolerate recommended diet with </= 1 overt/ soft/ silent s/s aspiration or distress b/s for adequate po nutrition/ hydration  2. Tolerate diet upgrades with </= 1 overt/ soft/ silent s/s aspiration or distress b/s for adequate po nutrition/ hydration    Outcome: Progressing

## 2022-02-17 ENCOUNTER — Inpatient Hospital Stay: Admit: 2022-02-17 | Payer: MEDICARE | Primary: Physician Assistant

## 2022-02-17 LAB — CBC
Hematocrit: 28.1 % — ABNORMAL LOW (ref 35.0–47.0)
Hemoglobin: 8.5 gm/dl — ABNORMAL LOW (ref 11.0–16.0)
MCH: 26.6 pg (ref 25.4–34.6)
MCHC: 30.2 gm/dl (ref 30.0–36.0)
MCV: 88.1 fL (ref 80.0–98.0)
MPV: 12 fL — ABNORMAL HIGH (ref 6.0–10.0)
Platelets: 173 10*3/uL (ref 140–450)
RBC: 3.19 M/uL — ABNORMAL LOW (ref 3.60–5.20)
RDW: 55.1 — ABNORMAL HIGH (ref 36.4–46.3)
WBC: 12 10*3/uL — ABNORMAL HIGH (ref 4.0–11.0)

## 2022-02-17 LAB — BASIC METABOLIC PANEL
Anion Gap: 7 mmol/L (ref 5–15)
BUN: 18 mg/dl (ref 9–23)
CO2: 35 mEq/L — ABNORMAL HIGH (ref 20–31)
Calcium: 8.5 mg/dl — ABNORMAL LOW (ref 8.7–10.4)
Chloride: 100 mEq/L (ref 98–107)
Creatinine: 0.63 mg/dl (ref 0.55–1.02)
GFR African American: >60
GFR Non-African American: >60
Glucose: 168 mg/dl — ABNORMAL HIGH (ref 74–106)
Potassium: 2.8 mEq/L — CL (ref 3.5–5.1)
Sodium: 142 mEq/L (ref 136–145)

## 2022-02-17 LAB — CULTURE, BLOOD 1

## 2022-02-17 LAB — MAGNESIUM: Magnesium: 1.4 mg/dL — ABNORMAL LOW (ref 1.6–2.6)

## 2022-02-17 LAB — POTASSIUM: Potassium: 3.1 mEq/L — ABNORMAL LOW (ref 3.5–5.1)

## 2022-02-17 LAB — POCT GLUCOSE
POC Glucose: 340 mg/dL — ABNORMAL HIGH (ref 65–105)
POC Glucose: 159 mg/dL — ABNORMAL HIGH (ref 65–105)
POC Glucose: 192 mg/dL — ABNORMAL HIGH (ref 65–105)
POC Glucose: 216 mg/dL — ABNORMAL HIGH (ref 65–105)

## 2022-02-17 LAB — PH, BODY FLUID: pH, Body Fluid: 7.8

## 2022-02-17 LAB — PHOSPHORUS: Phosphorus: 2.6 mg/dL (ref 2.4–5.1)

## 2022-02-17 LAB — T3: T3, TOTAL: 44.4 ng/dL — ABNORMAL LOW (ref 76–181)

## 2022-02-17 LAB — 1,3 BETA-D-GLUCAN
(1-3)-B-D-Glucan: 32 pg/mL (ref <60)
Interpretation: NEGATIVE

## 2022-02-17 MED ORDER — MAGNESIUM SULFATE 4000 MG/100 ML IVPB PREMIX
4 GM/100ML | Freq: Once | INTRAVENOUS | Status: AC
Start: 2022-02-17 — End: 2022-02-17
  Administered 2022-02-17: 12:00:00 4000 mg via INTRAVENOUS

## 2022-02-17 MED ORDER — APIXABAN 5 MG PO TABS
5 MG | Freq: Two times a day (BID) | ORAL | Status: AC
Start: 2022-02-17 — End: ?
  Administered 2022-02-18 – 2022-02-25 (×16): 5 mg via ORAL

## 2022-02-17 MED ORDER — MAGNESIUM SULFATE 2000 MG/50 ML IVPB PREMIX
2 GM/50ML | INTRAVENOUS | Status: AC | PRN
Start: 2022-02-17 — End: ?
  Administered 2022-02-17: 10:00:00 2000 mg via INTRAVENOUS

## 2022-02-17 MED ORDER — POTASSIUM CHLORIDE CRYS ER 20 MEQ PO TBCR
20 MEQ | Freq: Three times a day (TID) | ORAL | Status: AC
Start: 2022-02-17 — End: 2022-02-18
  Administered 2022-02-17: 20:00:00 30 meq via ORAL
  Administered 2022-02-17 – 2022-02-18 (×2): 40 meq via ORAL

## 2022-02-17 MED ORDER — FLUCONAZOLE IN SODIUM CHLORIDE 400-0.9 MG/200ML-% IV SOLN
INTRAVENOUS | Status: AC
Start: 2022-02-17 — End: ?
  Administered 2022-02-18 – 2022-02-25 (×8): 400 mg via INTRAVENOUS

## 2022-02-17 MED ORDER — MAGNESIUM OXIDE -MG SUPPLEMENT 400 (240 MG) MG PO TABS
400 (240 Mg) MG | Freq: Two times a day (BID) | ORAL | Status: AC
Start: 2022-02-17 — End: ?
  Administered 2022-02-17 – 2022-02-26 (×19): 400 mg via ORAL

## 2022-02-17 MED ORDER — FLUCONAZOLE IN SODIUM CHLORIDE 400-0.9 MG/200ML-% IV SOLN
Freq: Once | INTRAVENOUS | Status: AC
Start: 2022-02-17 — End: 2022-02-17
  Administered 2022-02-17: 18:00:00 400 mg via INTRAVENOUS

## 2022-02-17 MED ORDER — AMLODIPINE BESYLATE 5 MG PO TABS
5 MG | Freq: Every day | ORAL | Status: AC
Start: 2022-02-17 — End: ?
  Administered 2022-02-17 – 2022-02-26 (×10): 10 mg via ORAL

## 2022-02-17 MED ORDER — DAKINS (1/4 STRENGTH) 0.125 % EX SOLN
0.125 % | Freq: Every day | CUTANEOUS | Status: AC
Start: 2022-02-17 — End: ?
  Administered 2022-02-17 – 2022-02-26 (×8): via TOPICAL

## 2022-02-17 MED ORDER — POTASSIUM CHLORIDE 10 MEQ/100ML IV SOLN
10 MEQ/0ML | INTRAVENOUS | Status: AC | PRN
Start: 2022-02-17 — End: ?

## 2022-02-17 MED ORDER — FLUCONAZOLE (DIFLUCAN) IVPB
Freq: Once | INTRAVENOUS | Status: DC
Start: 2022-02-17 — End: 2022-02-17

## 2022-02-17 MED ORDER — POTASSIUM CHLORIDE 20 MEQ/100ML IV SOLN
20 MEQ/100ML | Freq: Once | INTRAVENOUS | Status: AC
Start: 2022-02-17 — End: ?

## 2022-02-17 MED ORDER — FLUCONAZOLE IN SODIUM CHLORIDE 400-0.9 MG/200ML-% IV SOLN
Freq: Once | INTRAVENOUS | Status: AC
Start: 2022-02-17 — End: 2022-02-17
  Administered 2022-02-17: 20:00:00 400 mg via INTRAVENOUS

## 2022-02-17 MED ORDER — LOSARTAN POTASSIUM 50 MG PO TABS
50 MG | Freq: Every day | ORAL | Status: AC
Start: 2022-02-17 — End: ?
  Administered 2022-02-17 – 2022-02-26 (×10): 100 mg via ORAL

## 2022-02-17 MED ORDER — POTASSIUM CHLORIDE 20 MEQ/50ML IV SOLN
20 MEQ/50ML | INTRAVENOUS | Status: AC | PRN
Start: 2022-02-17 — End: ?
  Administered 2022-02-17 (×3): 20 meq via INTRAVENOUS

## 2022-02-17 MED FILL — POTASSIUM CHLORIDE 20 MEQ/50ML IV SOLN: 20 MEQ/50ML | INTRAVENOUS | Qty: 50

## 2022-02-17 MED FILL — MAGNESIUM OXIDE -MG SUPPLEMENT 400 (240 MG) MG PO TABS: 400 (240 Mg) MG | ORAL | Qty: 1

## 2022-02-17 MED FILL — AMLODIPINE BESYLATE 5 MG PO TABS: 5 MG | ORAL | Qty: 2

## 2022-02-17 MED FILL — ACETAMINOPHEN 325 MG PO TABS: 325 MG | ORAL | Qty: 2

## 2022-02-17 MED FILL — DAKINS (1/4 STRENGTH) 0.125 % EX SOLN: 0.125 % | CUTANEOUS | Qty: 473

## 2022-02-17 MED FILL — MAGNESIUM SULFATE 2 GM/50ML IV SOLN: 2 GM/50ML | INTRAVENOUS | Qty: 50

## 2022-02-17 MED FILL — FLUCONAZOLE IN SODIUM CHLORIDE 400-0.9 MG/200ML-% IV SOLN: INTRAVENOUS | Qty: 200

## 2022-02-17 MED FILL — MONTELUKAST SODIUM 10 MG PO TABS: 10 MG | ORAL | Qty: 1

## 2022-02-17 MED FILL — MICAFUNGIN SODIUM 100 MG IV SOLR: 100 MG | INTRAVENOUS | Qty: 150

## 2022-02-17 MED FILL — HEPARIN SODIUM (PORCINE) 5000 UNIT/ML IJ SOLN: 5000 UNIT/ML | INTRAMUSCULAR | Qty: 1

## 2022-02-17 MED FILL — SODIUM CHLORIDE FLUSH 0.9 % IV SOLN: 0.9 % | INTRAVENOUS | Qty: 10

## 2022-02-17 MED FILL — PIPERACILLIN SOD-TAZOBACTAM SO 3.375 (3-0.375) G IV SOLR: 3.375 (3-0.375) g | INTRAVENOUS | Qty: 3375

## 2022-02-17 MED FILL — ADMELOG 100 UNIT/ML IJ SOLN: 100 UNIT/ML | INTRAMUSCULAR | Qty: 1

## 2022-02-17 MED FILL — POTASSIUM CHLORIDE 20 MEQ/100ML IV SOLN: 20 MEQ/100ML | INTRAVENOUS | Qty: 100

## 2022-02-17 MED FILL — POTASSIUM CHLORIDE CRYS ER 20 MEQ PO TBCR: 20 MEQ | ORAL | Qty: 2

## 2022-02-17 MED FILL — IPRATROPIUM-ALBUTEROL 0.5-2.5 (3) MG/3ML IN SOLN: RESPIRATORY_TRACT | Qty: 3

## 2022-02-17 MED FILL — PANTOPRAZOLE SODIUM 40 MG IV SOLR: 40 MG | INTRAVENOUS | Qty: 40

## 2022-02-17 MED FILL — LOSARTAN POTASSIUM 50 MG PO TABS: 50 MG | ORAL | Qty: 2

## 2022-02-17 MED FILL — BREO ELLIPTA 100-25 MCG/ACT IN AEPB: 100-25 MCG/ACT | RESPIRATORY_TRACT | Qty: 28

## 2022-02-17 MED FILL — SOLU-MEDROL (PF) 40 MG IJ SOLR: 40 MG | INTRAMUSCULAR | Qty: 40

## 2022-02-17 MED FILL — DOXY 100 100 MG IV SOLR: 100 MG | INTRAVENOUS | Qty: 100

## 2022-02-17 MED FILL — MAGNESIUM SULFATE 4 GM/100ML IV SOLN: 4 GM/100ML | INTRAVENOUS | Qty: 100
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
## 2022-02-17 NOTE — Progress Notes (Signed)
Bedside and Verbal shift change report given to Morrie Sheldon, Charity fundraiser (Cabin crew) by Lowella Dandy, RN (offgoing nurse). Report included the following information SBAR, Kardex, MAR and Recent Results.
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
## 2022-02-17 NOTE — Progress Notes (Signed)
Palliative Care Social Work Note    Chart reviewed. Telephone call to patient's spouse [name], he confirms that patient has not made a new Advance Directive since 09/01/2014, the copy on file is the most current. Writer clarified that there is no specific notation of a "2 week trial" for ventilation and resuscitation in the Advance Directive, he again confirmed there was not a new one made, and he confirmed patient's full code status.       Octaviano Glow, MSW   Social Worker  Palliative Care  p. [phone]
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
## 2022-02-18 LAB — CULTURE, URINE
Culture Result: >100000 — AB
ISOLATE: <10000 — AB
ISOLATE: <10000 — AB
ISOLATE: >100000 — AB
Isolate: <10000 — AB
Isolate: >100000 — AB

## 2022-02-18 LAB — POTASSIUM: Potassium: 3.2 mEq/L — ABNORMAL LOW (ref 3.5–5.1)

## 2022-02-18 LAB — PHOSPHORUS: Phosphorus: 1.5 mg/dL — ABNORMAL LOW (ref 2.4–5.1)

## 2022-02-18 LAB — CBC
Hematocrit: 29.5 % — ABNORMAL LOW (ref 35.0–47.0)
Hemoglobin: 9.1 gm/dl — ABNORMAL LOW (ref 11.0–16.0)
MCH: 27 pg (ref 25.4–34.6)
MCHC: 30.8 gm/dl (ref 30.0–36.0)
MCV: 87.5 fL (ref 80.0–98.0)
MPV: 11.9 fL — ABNORMAL HIGH (ref 6.0–10.0)
Platelets: 169 10*3/uL (ref 140–450)
RBC: 3.37 M/uL — ABNORMAL LOW (ref 3.60–5.20)
RDW: 54.7 — ABNORMAL HIGH (ref 36.4–46.3)
WBC: 12.3 10*3/uL — ABNORMAL HIGH (ref 4.0–11.0)

## 2022-02-18 LAB — POCT GLUCOSE
POC Glucose: 206 mg/dL — ABNORMAL HIGH (ref 65–105)
POC Glucose: 283 mg/dL — ABNORMAL HIGH (ref 65–105)
POC Glucose: 283 mg/dL — ABNORMAL HIGH (ref 65–105)

## 2022-02-18 LAB — ASPERGILLUS GALACTOMANNAN AG ASSAY
Aspergillus Galacto AG: NOT DETECTED
INDEX VALUE: 0.1 (ref <0.50)

## 2022-02-18 LAB — MAGNESIUM: Magnesium: 2 mg/dL (ref 1.6–2.6)

## 2022-02-18 MED ORDER — METOPROLOL SUCCINATE ER 25 MG PO TB24
25 MG | Freq: Every day | ORAL | Status: DC
Start: 2022-02-18 — End: 2022-02-19
  Administered 2022-02-19 (×2): 25 mg via ORAL

## 2022-02-18 MED ORDER — SPIRONOLACTONE 25 MG PO TABS
25 MG | Freq: Every day | ORAL | Status: AC
Start: 2022-02-18 — End: ?
  Administered 2022-02-19 – 2022-02-26 (×9): 25 mg via ORAL

## 2022-02-18 MED ORDER — POTASSIUM PHOSPHATE 3 MMOL/ML IV SOLN (MIXTURES ONLY)
15 mmol/5 mL | Freq: Once | INTRAVENOUS | Status: AC
Start: 2022-02-18 — End: 2022-02-18
  Administered 2022-02-18: 15:00:00 30 mmol via INTRAVENOUS

## 2022-02-18 MED ORDER — POTASSIUM CHLORIDE CRYS ER 20 MEQ PO TBCR
20 MEQ | Freq: Three times a day (TID) | ORAL | Status: DC
Start: 2022-02-18 — End: 2022-02-19
  Administered 2022-02-18 – 2022-02-19 (×3): 20 meq via ORAL

## 2022-02-18 MED ORDER — POTASSIUM PHOSPHATE MONOBASIC 500 MG PO TABS
500 MG | Freq: Three times a day (TID) | ORAL | Status: DC
Start: 2022-02-18 — End: 2022-02-19
  Administered 2022-02-18 (×2): 500 mg via ORAL

## 2022-02-18 MED FILL — SODIUM CHLORIDE FLUSH 0.9 % IV SOLN: 0.9 % | INTRAVENOUS | Qty: 10

## 2022-02-18 MED FILL — QUETIAPINE FUMARATE 25 MG PO TABS: 25 MG | ORAL | Qty: 1

## 2022-02-18 MED FILL — LANTUS 100 UNIT/ML SC SOLN: 100 UNIT/ML | SUBCUTANEOUS | Qty: 15

## 2022-02-18 MED FILL — K-PHOS 500 MG PO TABS: 500 MG | ORAL | Qty: 1

## 2022-02-18 MED FILL — ACETAMINOPHEN 325 MG PO TABS: 325 MG | ORAL | Qty: 2

## 2022-02-18 MED FILL — POTASSIUM PHOSPHATES 15 MMOLE/5ML IV SOLN: 15 MMOLE/5ML | INTRAVENOUS | Qty: 10

## 2022-02-18 MED FILL — SOLU-MEDROL (PF) 40 MG IJ SOLR: 40 MG | INTRAMUSCULAR | Qty: 40

## 2022-02-18 MED FILL — ELIQUIS 5 MG PO TABS: 5 MG | ORAL | Qty: 1

## 2022-02-18 MED FILL — MAGNESIUM OXIDE -MG SUPPLEMENT 400 (240 MG) MG PO TABS: 400 (240 Mg) MG | ORAL | Qty: 1

## 2022-02-18 MED FILL — DOXY 100 100 MG IV SOLR: 100 MG | INTRAVENOUS | Qty: 100

## 2022-02-18 MED FILL — IPRATROPIUM-ALBUTEROL 0.5-2.5 (3) MG/3ML IN SOLN: RESPIRATORY_TRACT | Qty: 3

## 2022-02-18 MED FILL — MONTELUKAST SODIUM 10 MG PO TABS: 10 MG | ORAL | Qty: 1

## 2022-02-18 MED FILL — AMLODIPINE BESYLATE 5 MG PO TABS: 5 MG | ORAL | Qty: 2

## 2022-02-18 MED FILL — ROSUVASTATIN CALCIUM 10 MG PO TABS: 10 MG | ORAL | Qty: 1

## 2022-02-18 MED FILL — PIPERACILLIN SOD-TAZOBACTAM SO 3.375 (3-0.375) G IV SOLR: 3.375 (3-0.375) g | INTRAVENOUS | Qty: 3375

## 2022-02-18 MED FILL — PANTOPRAZOLE SODIUM 40 MG IV SOLR: 40 MG | INTRAVENOUS | Qty: 40

## 2022-02-18 MED FILL — ADMELOG 100 UNIT/ML IJ SOLN: 100 UNIT/ML | INTRAMUSCULAR | Qty: 1

## 2022-02-18 MED FILL — ADMELOG 100 UNIT/ML IJ SOLN: 100 UNIT/ML | INTRAMUSCULAR | Qty: 6

## 2022-02-18 MED FILL — LOSARTAN POTASSIUM 50 MG PO TABS: 50 MG | ORAL | Qty: 2

## 2022-02-18 MED FILL — POTASSIUM CHLORIDE CRYS ER 20 MEQ PO TBCR: 20 MEQ | ORAL | Qty: 2

## 2022-02-18 MED FILL — POTASSIUM CHLORIDE CRYS ER 20 MEQ PO TBCR: 20 MEQ | ORAL | Qty: 1

## 2022-02-18 MED FILL — FLUCONAZOLE IN SODIUM CHLORIDE 400-0.9 MG/200ML-% IV SOLN: INTRAVENOUS | Qty: 200
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
## 2022-02-18 NOTE — Plan of Care (Signed)
Problem: Respiratory - Adult  Goal: Achieves optimal ventilation and oxygenation  Outcome: Progressing     Problem: Discharge Planning  Goal: Discharge to home or other facility with appropriate resources  Outcome: Progressing     Problem: Safety - Adult  Goal: Free from fall injury  Outcome: Progressing     Problem: Chronic Conditions and Co-morbidities  Goal: Patient's chronic conditions and co-morbidity symptoms are monitored and maintained or improved  Outcome: Progressing     Problem: Pain  Goal: Verbalizes/displays adequate comfort level or baseline comfort level  Outcome: Progressing     Problem: Safety - Medical Restraint  Goal: Remains free of injury from restraints (Restraint for Interference with Medical Device)  Description: INTERVENTIONS:  1. Determine that other, less restrictive measures have been tried or would not be effective before applying the restraint  2. Evaluate the patient's condition at the time of restraint application  3. Inform patient/family regarding the reason for restraint  4. Q2H: Monitor safety, psychosocial status, comfort, nutrition and hydration  Outcome: Progressing     Problem: Skin/Tissue Integrity  Goal: Absence of new skin breakdown  Description: 1.  Monitor for areas of redness and/or skin breakdown  2.  Assess vascular access sites hourly  3.  Every 4-6 hours minimum:  Change oxygen saturation probe site  4.  Every 4-6 hours:  If on nasal continuous positive airway pressure, respiratory therapy assess nares and determine need for appliance change or resting period.  Outcome: Progressing     Problem: ABCDS Injury Assessment  Goal: Absence of physical injury  Outcome: Progressing

## 2022-02-18 NOTE — Progress Notes (Signed)
SPEECH LANGUAGE PATHOLOGY TREATMENT     Patient: [name] [name] (71 y.o. female), Feb 25, 1951  Room: 4308/4308  Primary Diagnosis: Acute hyperkalemia [E87.5]  Hyperglycemia [R73.9]  Septic shock (HCC) [A41.9, R65.21]  Acute respiratory failure with hypoxia and hypercapnia (HCC) [J96.01, J96.02]  Community acquired pneumonia of left lung, unspecified part of lung [J18.9]        Date of Admission: 02/13/2022  Length of Stay: 4 day(s)    Date: 02/18/2022   Start Time:  910 End Time:  918    Total Minutes: 8 minutes    Isolation:  No active isolations        PPE: surgical mask, gloves  Precautions:  universal, aspiration, and fall      ASSESSMENT:   Orientation: Patient awake, alert  Respiratory Status: 2 liters O2 via nasal cannula    Treatment: Patient seen at bedside for follow up dysphagia therapy. Patient eating breakfast when SLP arrived. Patient consumed minced and moist solids and thin liquids without any overt clinical signs or symptoms suggestive of penetration or aspiration. Patient laying on her left side in a reclined position. SLP asked patient to sit up for improved swallow safety but patient refused despite education. Patient also consumed pills whole with liquids with RN without any difficulty. Recommend continuation of IDDSI-5 Minced and Moist with IDDSI-0 Thin liquids as tolerated. Meds as tolerated. Strict aspiration precautions with one to one assist for PO. POC discussed with patient and RN. SLP will follow accordingly.        PLAN/ RECOMMENDATIONS:                 Diet Recommendations: IDDSI 5- minced/ moist and IDDSI 0- thin  Compensatory Swallow Strategies: small bites/ sip, slow rate of eating/ feeding, alternate solids/ liquids, HOB >30* at all time, meticulous oral care at least 3x/ day, basic compensatory swallow strategies   Medication Administration: as tolerated   Aspiration Precautions: supervision for PO intake, 1:1 assistance, HOB >30* at all times, meticulous oral care, suction set  up  Risk(s) for Aspiration: mental ability/ status, current level of dysphagia     Therapy Recommendations: Patient would benefit from continuation of SLP services for dysphagia 2-5x/ week to address goals per POC  Therapy Prognosis: fair      Discharge Recommendations: SNF  Other Recommended Services: OT, PT     SUBJECTIVE    Patient Stated: "Don't you have anything better to do."   Pain Level: no pain reported     OBJECTIVE   Position: HOB 30*  Consistencies: thin via straw, minced/ moist (IDDSI 5)  Oral Phase: prolonged mastication  Pharyngeal Phase: WFL    GOALS    See POC     EDUCATION/COMMUNICATION   Education Provided: POC, deficits, and diet recommendations  Individual Educated: Patient  Comprehension: Query carryover  Barriers to learning/limitations: Yes;  cognitive  Staff Education: Educated Nurse to POC, deficits, and diet recommendations.      Safety: Following session, pt in bed, lowest position, HOB 30*, 4 side rails up, bed alarm on       Thank you for this referral,  Sandford Craze MA-CCC-SLP  Speech Pathologist   Office [phone]
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
## 2022-02-19 LAB — POC CG4:BLOOD GAS,LACTATE
Base Excess: 11 mmol/L — ABNORMAL HIGH (ref <=3)
HCO3: 34.7 mmol/L (ref 18.0–26.0)
LITERS PER MINUTE: 3
Lactate: 0.37 mmol/L — ABNORMAL LOW (ref 0.40–2.00)
O2 Sat: 95 % (ref 90–100)
PCO2: 50.1 mm Hg — ABNORMAL HIGH (ref 35.0–45.0)
PO2: 73 mm Hg — ABNORMAL LOW (ref 75–100)
Ph: 7.447 (ref 7.350–7.450)
Pt Temp: 36.7
Total CO2: 36 mmol/L — ABNORMAL HIGH (ref 24–29)

## 2022-02-19 LAB — COMPREHENSIVE METABOLIC PANEL
ALT: 7 U/L — ABNORMAL LOW (ref 10–49)
AST: <8 U/L (ref 0.0–33.9)
Albumin: 3 gm/dl — ABNORMAL LOW (ref 3.4–5.0)
Alkaline Phosphatase: 58 U/L (ref 46–116)
Anion Gap: 2 mmol/L — ABNORMAL LOW (ref 5–15)
BUN: 14 mg/dl (ref 9–23)
CO2: 35 mEq/L — ABNORMAL HIGH (ref 20–31)
Calcium: 8.6 mg/dl — ABNORMAL LOW (ref 8.7–10.4)
Chloride: 104 mEq/L (ref 98–107)
Creatinine: 0.61 mg/dl (ref 0.55–1.02)
GFR African American: >60
GFR Non-African American: >60
Glucose: 191 mg/dl — ABNORMAL HIGH (ref 74–106)
Potassium: 4.2 mEq/L (ref 3.5–5.1)
Sodium: 141 mEq/L (ref 136–145)
Total Bilirubin: 0.5 mg/dl (ref 0.30–1.20)
Total Protein: 5.9 gm/dl (ref 5.7–8.2)

## 2022-02-19 LAB — POCT GLUCOSE
POC Glucose: 182 mg/dL — ABNORMAL HIGH (ref 65–105)
POC Glucose: 236 mg/dL — ABNORMAL HIGH (ref 65–105)
POC Glucose: 224 mg/dL — ABNORMAL HIGH (ref 65–105)
POC Glucose: 198 mg/dL — ABNORMAL HIGH (ref 65–105)

## 2022-02-19 LAB — CBC
Hematocrit: 29.6 % — ABNORMAL LOW (ref 35.0–47.0)
Hemoglobin: 9.3 gm/dl — ABNORMAL LOW (ref 11.0–16.0)
MCH: 27.1 pg (ref 25.4–34.6)
MCHC: 31.4 gm/dl (ref 30.0–36.0)
MCV: 86.3 fL (ref 80.0–98.0)
MPV: 12.5 fL — ABNORMAL HIGH (ref 6.0–10.0)
Platelets: 178 10*3/uL (ref 140–450)
RBC: 3.43 M/uL — ABNORMAL LOW (ref 3.60–5.20)
RDW: 56 — ABNORMAL HIGH (ref 36.4–46.3)
WBC: 14.7 10*3/uL — ABNORMAL HIGH (ref 4.0–11.0)

## 2022-02-19 LAB — MAGNESIUM: Magnesium: 1.7 mg/dL (ref 1.6–2.6)

## 2022-02-19 LAB — PHOSPHORUS: Phosphorus: 2.7 mg/dL (ref 2.4–5.1)

## 2022-02-19 LAB — CULTURE, BLOOD 2: BLOOD CULTURE RESULT: NO GROWTH

## 2022-02-19 MED ORDER — METOPROLOL SUCCINATE ER 50 MG PO TB24
50 MG | Freq: Every day | ORAL | Status: AC
Start: 2022-02-19 — End: ?
  Administered 2022-02-20 – 2022-02-26 (×7): 50 mg via ORAL

## 2022-02-19 MED ORDER — ALBUTEROL SULFATE (2.5 MG/3ML) 0.083% IN NEBU
RESPIRATORY_TRACT | Status: AC | PRN
Start: 2022-02-19 — End: 2022-02-21
  Administered 2022-02-19 – 2022-02-20 (×4): 2.5 mg via RESPIRATORY_TRACT

## 2022-02-19 MED ORDER — PANTOPRAZOLE SODIUM 40 MG PO TBEC
40 MG | Freq: Every day | ORAL | Status: AC
Start: 2022-02-19 — End: ?
  Administered 2022-02-20 – 2022-02-26 (×7): 40 mg via ORAL

## 2022-02-19 MED FILL — AMLODIPINE BESYLATE 5 MG PO TABS: 5 MG | ORAL | Qty: 2

## 2022-02-19 MED FILL — PIPERACILLIN SOD-TAZOBACTAM SO 3.375 (3-0.375) G IV SOLR: 3.375 (3-0.375) g | INTRAVENOUS | Qty: 3375

## 2022-02-19 MED FILL — ERYTHROMYCIN 5 MG/GM OP OINT: 5 MG/GM | OPHTHALMIC | Qty: 1

## 2022-02-19 MED FILL — METOPROLOL SUCCINATE ER 25 MG PO TB24: 25 MG | ORAL | Qty: 1

## 2022-02-19 MED FILL — SODIUM CHLORIDE FLUSH 0.9 % IV SOLN: 0.9 % | INTRAVENOUS | Qty: 10

## 2022-02-19 MED FILL — IPRATROPIUM-ALBUTEROL 0.5-2.5 (3) MG/3ML IN SOLN: RESPIRATORY_TRACT | Qty: 3

## 2022-02-19 MED FILL — PANTOPRAZOLE SODIUM 40 MG IV SOLR: 40 MG | INTRAVENOUS | Qty: 40

## 2022-02-19 MED FILL — SPIRONOLACTONE 25 MG PO TABS: 25 MG | ORAL | Qty: 1

## 2022-02-19 MED FILL — MAGNESIUM OXIDE -MG SUPPLEMENT 400 (240 MG) MG PO TABS: 400 (240 Mg) MG | ORAL | Qty: 1

## 2022-02-19 MED FILL — ALBUTEROL SULFATE (2.5 MG/3ML) 0.083% IN NEBU: RESPIRATORY_TRACT | Qty: 3

## 2022-02-19 MED FILL — DOXY 100 100 MG IV SOLR: 100 MG | INTRAVENOUS | Qty: 100

## 2022-02-19 MED FILL — QUETIAPINE FUMARATE 25 MG PO TABS: 25 MG | ORAL | Qty: 1

## 2022-02-19 MED FILL — SILVER SULFADIAZINE 1 % EX CREA: 1 % | CUTANEOUS | Qty: 50

## 2022-02-19 MED FILL — MONTELUKAST SODIUM 10 MG PO TABS: 10 MG | ORAL | Qty: 1

## 2022-02-19 MED FILL — POTASSIUM CHLORIDE CRYS ER 20 MEQ PO TBCR: 20 MEQ | ORAL | Qty: 1

## 2022-02-19 MED FILL — HUMALOG 100 UNIT/ML IJ SOLN: 100 UNIT/ML | INTRAMUSCULAR | Qty: 1

## 2022-02-19 MED FILL — ROSUVASTATIN CALCIUM 5 MG PO TABS: 5 MG | ORAL | Qty: 1

## 2022-02-19 MED FILL — LOSARTAN POTASSIUM 50 MG PO TABS: 50 MG | ORAL | Qty: 2

## 2022-02-19 MED FILL — FLUCONAZOLE IN SODIUM CHLORIDE 400-0.9 MG/200ML-% IV SOLN: INTRAVENOUS | Qty: 200

## 2022-02-19 MED FILL — ELIQUIS 5 MG PO TABS: 5 MG | ORAL | Qty: 1

## 2022-02-19 NOTE — Progress Notes (Signed)
ABG drawn as ordered. Results are as follows:     Latest Reference Range & Units 02/19/22 05:14   Ph 7.350 - 7.450   7.447   PCO2 35.0 - 45.0 mm Hg 50.1 (H)   pO2 75 - 100 mm Hg 73.0 (L)   HCO3 18.0 - 26.0 mmol/L 34.7 (HH)   Base Excess -2 - 3 mmol/L 11 (H)   O2 Sat 90 - 100 % 95.0   Allen Test -   Pass   Pt Temp -   36.7 C   Sample Type -   Art   Delivery Systems -   Nasal Can   Draw Site -   L Radial   LITERS PER MINUTE -   3   (HH): Data is critically high  (H): Data is abnormally high  (L): Data is abnormally low    Will continue to monitor patient.

## 2022-02-19 NOTE — Progress Notes (Signed)
Progress Note    Patient: [name] [name]   Age:  71 y.o.  DOA: 02/13/2022   Admit Dx / CC:   LOS:  LOS: 5 days     Active Problems   1.  Septic shock likely due to pneumonia  2.  Acute hypoxic respiratory failure  3.  Hyperkalemia  4.  COPD with acute exacerbation  5.  DM type II controlled   6.  History of DVT  7.  Hypertension  8.  Macrocytic anemia likely iron deficiency  9.  Hypokalemia  10.  Hypomagnesemia  11.  Hypophosphatemia  Plan:   1.  Patient was seen and examined this morning.  Patient was feeling better patient's electrolytes have improved we will keep monitoring  PT/OT consult done patient was encouraged to participate  2.  For glycemic control patient will be continued on Glucomander protocol  3.  For better hypertension control metoprolol was increased to 50 mg daily.  4.  For anemia patient was started on iron infusion  5.  Patient is also being followed by pulmonary.  Discharge planning either home with home health versus skilled nursing facility depending on PT OT recommendation  Subjective:   Feeling much better     Objective:   Visit Vitals  BP (!) 155/85   Pulse 80   Temp 99.1 F (37.3 C) (Oral)   Resp 16   Ht 5\' 7"  (1.702 m)   Wt 215 lb 6.2 oz (97.7 kg)   SpO2 97%   BMI 33.73 kg/m       Physical Exam:  General appearance: Very pleasant elderly female well-built sitting comfortably in no acute distress  Lungs: Good air entry on both sides  Heart: Regular rhythm, no murmur,   Abdomen: soft, non-tender. Bowel sounds Audible,  Extremities: Trace edema, distal pulses palpable  Neurologic: Awake and alert  Answering questions appropriately    Intake and Output:  Current Shift:  09/09 0701 - 09/09 1900  In: -   Out: 1400 [Urine:1400]  Last three shifts:  09/07 1901 - 09/09 0700  In: 431.3   Out: 4050 [Urine:4050]    Lab/Data Reviewed:  Recent Results (from the past 48 hour(s))   POCT Glucose    Collection Time: 02/17/22  5:19 PM   Result Value Ref Range    POC Glucose 216 (H) 65 - 105 mg/dL   POCT  Glucose    Collection Time: 02/17/22  8:46 PM   Result Value Ref Range    POC Glucose 206 (H) 65 - 105 mg/dL   Potassium    Collection Time: 02/18/22  5:13 AM   Result Value Ref Range    Potassium 3.2 (L) 3.5 - 5.1 mEq/L   Magnesium    Collection Time: 02/18/22  5:13 AM   Result Value Ref Range    Magnesium 2.0 1.6 - 2.6 mg/dL   CBC    Collection Time: 02/18/22  5:13 AM   Result Value Ref Range    WBC 12.3 (H) 4.0 - 11.0 1000/mm3    RBC 3.37 (L) 3.60 - 5.20 M/uL    Hemoglobin 9.1 (L) 11.0 - 16.0 gm/dl    Hematocrit 04/20/22 (L) 35.0 - 47.0 %    MCV 87.5 80.0 - 98.0 fL    MCH 27.0 25.4 - 34.6 pg    MCHC 30.8 30.0 - 36.0 gm/dl    Platelets 52.8 413 - 450 1000/mm3    MPV 11.9 (H) 6.0 - 10.0 fL  RDW 54.7 (H) 36.4 - 46.3     Phosphorus    Collection Time: 02/18/22  5:13 AM   Result Value Ref Range    Phosphorus 1.5 (L) 2.4 - 5.1 mg/dL   POCT Glucose    Collection Time: 02/18/22 12:05 PM   Result Value Ref Range    POC Glucose 283 (H) 65 - 105 mg/dL   POCT Glucose    Collection Time: 02/18/22  4:04 PM   Result Value Ref Range    POC Glucose 283 (H) 65 - 105 mg/dL   POCT Glucose    Collection Time: 02/18/22 11:38 PM   Result Value Ref Range    POC Glucose 182 (H) 65 - 105 mg/dL   CBC    Collection Time: 02/19/22  3:15 AM   Result Value Ref Range    WBC 14.7 (H) 4.0 - 11.0 1000/mm3    RBC 3.43 (L) 3.60 - 5.20 M/uL    Hemoglobin 9.3 (L) 11.0 - 16.0 gm/dl    Hematocrit 10.9 (L) 35.0 - 47.0 %    MCV 86.3 80.0 - 98.0 fL    MCH 27.1 25.4 - 34.6 pg    MCHC 31.4 30.0 - 36.0 gm/dl    Platelets 323 557 - 450 1000/mm3    MPV 12.5 (H) 6.0 - 10.0 fL    RDW 56.0 (H) 36.4 - 46.3     Comprehensive Metabolic Panel    Collection Time: 02/19/22  3:15 AM   Result Value Ref Range    Potassium 4.2 3.5 - 5.1 mEq/L    Chloride 104 98 - 107 mEq/L    Sodium 141 136 - 145 mEq/L    CO2 35 (H) 20 - 31 mEq/L    Glucose 191 (H) 74 - 106 mg/dl    BUN 14 9 - 23 mg/dl    Creatinine 3.22 0.25 - 1.02 mg/dl    GFR African American >60.0      GFR Non-African  American >60      Calcium 8.6 (L) 8.7 - 10.4 mg/dl    Anion Gap 2 (L) 5 - 15 mmol/L    AST <8.0 0.0 - 33.9 U/L    ALT 7 (L) 10 - 49 U/L    Alkaline Phosphatase 58 46 - 116 U/L    Total Bilirubin 0.50 0.30 - 1.20 mg/dl    Total Protein 5.9 5.7 - 8.2 gm/dl    Albumin 3.0 (L) 3.4 - 5.0 gm/dl   Magnesium    Collection Time: 02/19/22  3:15 AM   Result Value Ref Range    Magnesium 1.7 1.6 - 2.6 mg/dL   Phosphorus    Collection Time: 02/19/22  3:15 AM   Result Value Ref Range    Phosphorus 2.7 2.4 - 5.1 mg/dL   POC [id number] GAS,LACTATE    Collection Time: 02/19/22  5:14 AM   Result Value Ref Range    Ph 7.447 7.350 - 7.450      PCO2 50.1 (H) 35.0 - 45.0 mm Hg    PO2 73.0 (L) 75 - 100 mm Hg    HCO3 34.7 (HH) 18.0 - 26.0 mmol/L    O2 Sat 95.0 90 - 100 %    Total CO2 36.0 (H) 24 - 29 mmol/L    Lactate 0.37 (L) 0.40 - 2.00 mmol/L    Base Excess 11 (H) -2 - 3 mmol/L    Pt Temp 36.7 C      Sample Type Art  Draw Site L Radial      Delivery Systems Nasal Can      Allen Test Pass      LITERS PER MINUTE 3     POCT Glucose    Collection Time: 02/19/22  6:31 AM   Result Value Ref Range    POC Glucose 236 (H) 65 - 105 mg/dL   POCT Glucose    Collection Time: 02/19/22 11:43 AM   Result Value Ref Range    POC Glucose 224 (H) 65 - 105 mg/dL   POCT Glucose    Collection Time: 02/19/22  4:25 PM   Result Value Ref Range    POC Glucose 198 (H) 65 - 105 mg/dL       Imaging:  @[email or web address] @    Medications Reviewed:  Current Facility-Administered Medications   Medication Dose Route Frequency    spironolactone (ALDACTONE) tablet 25 mg  25 mg Oral Daily    metoprolol succinate (TOPROL XL) extended release tablet 25 mg  25 mg Oral Daily    albuterol (PROVENTIL) (2.5 MG/3ML) 0.083% nebulizer solution 2.5 mg  2.5 mg Nebulization Q2H PRN    potassium chloride 20 mEq/50 mL IVPB (Central Line)  20 mEq IntraVENous PRN    Or    potassium chloride 10 mEq/100 mL IVPB (Peripheral Line)  10 mEq IntraVENous PRN    magnesium sulfate 2000 mg in 50 mL IVPB  premix  2,000 mg IntraVENous PRN    magnesium oxide (MAG-OX) tablet 400 mg  400 mg Oral BID    potassium chloride 20 mEq in 100 mL IVPB  20 mEq IntraVENous Once    apixaban (ELIQUIS) tablet 5 mg  5 mg Oral BID    amLODIPine (NORVASC) tablet 10 mg  10 mg Oral Daily    losartan (COZAAR) tablet 100 mg  100 mg Oral Daily    sodium hypochlorite (DAKINS) 0.125 % external solution   Topical Daily    fluconazole (DIFLUCAN) in 0.9 % sodium chloride IVPB 400 mg  400 mg IntraVENous Q24H    silver sulfADIAZINE (SILVADENE) 1 % cream   Topical BID    erythromycin (ROMYCIN) ophthalmic ointment   Both Eyes 3 times per day    prochlorperazine (COMPAZINE) injection 10 mg  10 mg IntraVENous Q6H PRN    insulin lispro (HUMALOG) injection vial 1-100 Units  1-100 Units SubCUTAneous 4x Daily AC & HS    QUEtiapine (SEROQUEL) tablet 25 mg  25 mg Oral Nightly    sodium chloride flush 0.9 % injection 5-40 mL  5-40 mL IntraVENous BID    fluticasone furoate-vilanterol (BREO ELLIPTA) 100-25 MCG/ACT inhaler 1 puff  1 puff Inhalation Daily    montelukast (SINGULAIR) tablet 10 mg  10 mg Oral Daily    rosuvastatin (CRESTOR) tablet 5 mg  5 mg Oral Nightly    pantoprazole (PROTONIX) 40 mg in sodium chloride (PF) 0.9 % 10 mL injection  40 mg IntraVENous Daily    piperacillin-tazobactam (ZOSYN) 3,375 mg in sodium chloride 0.9 % 100 mL IVPB (mini-bag)  3,375 mg IntraVENous Q8H    insulin glargine (LANTUS) injection vial 1-100 Units  1-100 Units SubCUTAneous QHS    sodium chloride flush 0.9 % injection 5-40 mL  5-40 mL IntraVENous 2 times per day    sodium chloride flush 0.9 % injection 5-40 mL  5-40 mL IntraVENous PRN    0.9 % sodium chloride infusion   IntraVENous PRN    ondansetron (ZOFRAN-ODT) disintegrating tablet 4 mg  4 mg Oral Q8H PRN  Or    ondansetron (ZOFRAN) injection 4 mg  4 mg IntraVENous Q6H PRN    polyethylene glycol (GLYCOLAX) packet 17 g  17 g Oral Daily PRN    acetaminophen (TYLENOL) tablet 650 mg  650 mg Oral Q6H PRN    Or     acetaminophen (TYLENOL) suppository 650 mg  650 mg Rectal Q6H PRN    doxycycline (VIBRAMYCIN) 100 mg in sodium chloride 0.9 % 100 mL IVPB (mini-bag)  100 mg IntraVENous Q12H    glucagon (rDNA) injection 1 mg  1 mg IntraMUSCular PRN    dextrose 50 % IV solution  20-30 mL IntraVENous PRN    ipratropium 0.5 mg-albuterol 2.5 mg (DUONEB) nebulizer solution 1 Dose  1 Dose Inhalation TID RT         Dragon medical dictation software was used for portions of this report. Unintended errors may occur.   [name], MD  P# [phone]  February 19, 2022
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
## 2022-02-19 NOTE — Progress Notes (Signed)
Remote note.  Chart reviewed. Notes, labs and imaging reviewed as needed.    ID following for Bacteremia, Fungemia, UTI, Pleural Effusion, and Large bullous disease    -Remains afebrile with stable vital signs on NC  -Elevated white count-was on steroids  -Low but stable H&H, normal platelets  -Normal renal function and LFTs  -History of hep but Non reactive HIV    -Blood cultures 1/2 growing E faecalis and C albicans  -Urine culture with lactobacillus sp, E. coli, Pseudomonas, E faecalis and Candida glabrata  -Suspect BSI secondary to UTI  -Renal ultrasound on 9/6 showing right kidney kidney with multiple cysts and mild hydro, left kidney with 1 cysts no calculi or hydro  -2D echo without any obvious valve vegetation  -Repeat Blcx 9/4 NGTD and 9/5 from CVL NGTD   -Consider removal of central line when able    -S/p thoracentesis and benign appearing fluid with 325 white cells, 93 LDH, less than 2 protein and negative culture  -Urine antigens negative  -Fungal markers with neg Aspergillus Ag and fungitell  -9/7 CXR stable left apical bullae and bilateral pleural effusions and airspace disease, no new focal airspace disease    -Continue IV Zosyn and Doxy for now (Day 6)  -Continue fluconazole (Day 4 of antifugnal)  -Plan 7 days of Abx for PNA but will need 10-14 days for BSI/UTI.  Consider switching to oral when ready for discharge but likely will finish course here  -Off Steroids now  -Aspiration precautions    -Monitor repeat blood cultures    -Monitor pleural fluid cultures  -monitor labs as needed  -Monitor for any ADE    Please call me if you have any question or concern.We will check back on Monday.  Thank you.    Evlyn Courier, APRN - NP  February 19, 2022    [facility] Infectious Disease   Office Phone:[phone]  [phone]

## 2022-02-19 NOTE — Plan of Care (Signed)
Problem: Respiratory - Adult  Goal: Achieves optimal ventilation and oxygenation  Outcome: Progressing     Problem: Discharge Planning  Goal: Discharge to home or other facility with appropriate resources  Outcome: Progressing     Problem: Safety - Adult  Goal: Free from fall injury  Outcome: Progressing     Problem: Chronic Conditions and Co-morbidities  Goal: Patient's chronic conditions and co-morbidity symptoms are monitored and maintained or improved  Outcome: Progressing     Problem: Pain  Goal: Verbalizes/displays adequate comfort level or baseline comfort level  Outcome: Progressing     Problem: Safety - Medical Restraint  Goal: Remains free of injury from restraints (Restraint for Interference with Medical Device)  Description: INTERVENTIONS:  1. Determine that other, less restrictive measures have been tried or would not be effective before applying the restraint  2. Evaluate the patient's condition at the time of restraint application  3. Inform patient/family regarding the reason for restraint  4. Q2H: Monitor safety, psychosocial status, comfort, nutrition and hydration  Outcome: Progressing     Problem: Skin/Tissue Integrity  Goal: Absence of new skin breakdown  Description: 1.  Monitor for areas of redness and/or skin breakdown  2.  Assess vascular access sites hourly  3.  Every 4-6 hours minimum:  Change oxygen saturation probe site  4.  Every 4-6 hours:  If on nasal continuous positive airway pressure, respiratory therapy assess nares and determine need for appliance change or resting period.  Outcome: Progressing     Problem: ABCDS Injury Assessment  Goal: Absence of physical injury  Outcome: Progressing

## 2022-02-20 ENCOUNTER — Inpatient Hospital Stay: Admit: 2022-02-20 | Payer: MEDICARE | Primary: Physician Assistant

## 2022-02-20 LAB — CBC WITH AUTO DIFFERENTIAL
Basophils: 0.1 % (ref 0–3)
Eosinophils: 0.8 % (ref 0–5)
Hematocrit: 31.3 % — ABNORMAL LOW (ref 35.0–47.0)
Hemoglobin: 9.7 gm/dl — ABNORMAL LOW (ref 11.0–16.0)
Immature Granulocytes: 0.7 % (ref 0.0–3.0)
Lymphocytes: 8.2 % — ABNORMAL LOW (ref 28–48)
MCH: 26.1 pg (ref 25.4–34.6)
MCHC: 31 gm/dl (ref 30.0–36.0)
MCV: 84.1 fL (ref 80.0–98.0)
MPV: 11.6 fL — ABNORMAL HIGH (ref 6.0–10.0)
Monocytes: 7.5 % (ref 1–13)
Neutrophils Segmented: 82.7 % — ABNORMAL HIGH (ref 34–64)
Nucleated RBCs: 0 (ref 0–0)
Platelets: 176 10*3/uL (ref 140–450)
RBC: 3.72 M/uL (ref 3.60–5.20)
RDW: 57.4 — ABNORMAL HIGH (ref 36.4–46.3)
WBC: 21 10*3/uL — ABNORMAL HIGH (ref 4.0–11.0)

## 2022-02-20 LAB — POC CG4:BLOOD GAS,LACTATE
Base Excess: 7 mmol/L — ABNORMAL HIGH (ref <=3)
Base Excess: 6 mmol/L — ABNORMAL HIGH (ref <=3)
EPAP chH2O: 5
FIO2: 35
HCO3: 33.8 mmol/L (ref 18.0–26.0)
HCO3: 31.8 mmol/L (ref 18.0–26.0)
IPAP cmH2O: 15
LITERS PER MINUTE: 4
Lactate: 0.97 mmol/L (ref 0.40–2.00)
Lactate: <0.3 mmol/L — ABNORMAL LOW (ref 0.40–2.00)
O2 Sat: 96 % (ref 90–100)
O2 Sat: 93 % (ref 90–100)
PCO2: 70.6 mm Hg (ref 35.0–45.0)
PCO2: 55.9 mm Hg — ABNORMAL HIGH (ref 35.0–45.0)
PO2: 92 mm Hg (ref 75–100)
PO2: 70 mm Hg — ABNORMAL LOW (ref 75–100)
Ph: 7.288 — CL (ref 7.350–7.450)
Ph: 7.363 (ref 7.350–7.450)
Set Rate: 8
Total CO2: 36 mmol/L — ABNORMAL HIGH (ref 24–29)
Total CO2: 33 mmol/L — ABNORMAL HIGH (ref 24–29)

## 2022-02-20 LAB — COMPREHENSIVE METABOLIC PANEL
ALT: 15 U/L (ref 10–49)
AST: 12 U/L (ref 0.0–33.9)
Albumin: 3 gm/dl — ABNORMAL LOW (ref 3.4–5.0)
Alkaline Phosphatase: 58 U/L (ref 46–116)
Anion Gap: 6 mmol/L (ref 5–15)
BUN: 19 mg/dl (ref 9–23)
CO2: 31 mEq/L (ref 20–31)
Calcium: 9 mg/dl (ref 8.7–10.4)
Chloride: 102 mEq/L (ref 98–107)
Creatinine: 0.56 mg/dl (ref 0.55–1.02)
GFR African American: >60
GFR Non-African American: >60
Glucose: 155 mg/dl — ABNORMAL HIGH (ref 74–106)
Potassium: 4.2 mEq/L (ref 3.5–5.1)
Sodium: 139 mEq/L (ref 136–145)
Total Bilirubin: 0.5 mg/dl (ref 0.30–1.20)
Total Protein: 6.3 gm/dl (ref 5.7–8.2)

## 2022-02-20 LAB — POCT GLUCOSE
POC Glucose: 171 mg/dL — ABNORMAL HIGH (ref 65–105)
POC Glucose: 200 mg/dL — ABNORMAL HIGH (ref 65–105)
POC Glucose: 144 mg/dL — ABNORMAL HIGH (ref 65–105)
POC Glucose: 181 mg/dL — ABNORMAL HIGH (ref 65–105)
POC Glucose: 156 mg/dL — ABNORMAL HIGH (ref 65–105)
POC Glucose: 170 mg/dL — ABNORMAL HIGH (ref 65–105)
POC Glucose: 171 mg/dL — ABNORMAL HIGH (ref 65–105)

## 2022-02-20 LAB — CULTURE, BODY FLUID: Culture Result: NO GROWTH

## 2022-02-20 MED ORDER — GUAIFENESIN 100 MG/5ML PO LIQD
100 MG/5ML | Freq: Three times a day (TID) | ORAL | Status: AC
Start: 2022-02-20 — End: ?
  Administered 2022-02-20 – 2022-02-26 (×18): 400 mg via ORAL

## 2022-02-20 MED ORDER — LORAZEPAM 2 MG/ML IJ SOLN
2 MG/ML | Freq: Four times a day (QID) | INTRAMUSCULAR | Status: AC | PRN
Start: 2022-02-20 — End: 2022-02-23
  Administered 2022-02-20: 18:00:00 0.5 mg via INTRAVENOUS

## 2022-02-20 MED ORDER — FUROSEMIDE 10 MG/ML IJ SOLN
10 MG/ML | Freq: Once | INTRAMUSCULAR | Status: AC
Start: 2022-02-20 — End: 2022-02-20
  Administered 2022-02-20: 16:00:00 40 mg via INTRAVENOUS

## 2022-02-20 MED FILL — MAGNESIUM OXIDE -MG SUPPLEMENT 400 (240 MG) MG PO TABS: 400 (240 Mg) MG | ORAL | Qty: 1

## 2022-02-20 MED FILL — METOPROLOL SUCCINATE ER 50 MG PO TB24: 50 MG | ORAL | Qty: 1

## 2022-02-20 MED FILL — DAKINS (1/4 STRENGTH) 0.125 % EX SOLN: 0.125 % | CUTANEOUS | Qty: 473

## 2022-02-20 MED FILL — SODIUM CHLORIDE FLUSH 0.9 % IV SOLN: 0.9 % | INTRAVENOUS | Qty: 10

## 2022-02-20 MED FILL — HUMALOG 100 UNIT/ML IJ SOLN: 100 UNIT/ML | INTRAMUSCULAR | Qty: 1

## 2022-02-20 MED FILL — PIPERACILLIN SOD-TAZOBACTAM SO 3.375 (3-0.375) G IV SOLR: 3.375 (3-0.375) g | INTRAVENOUS | Qty: 3375

## 2022-02-20 MED FILL — LORAZEPAM 2 MG/ML IJ SOLN: 2 MG/ML | INTRAMUSCULAR | Qty: 1

## 2022-02-20 MED FILL — ALBUTEROL SULFATE (2.5 MG/3ML) 0.083% IN NEBU: RESPIRATORY_TRACT | Qty: 3

## 2022-02-20 MED FILL — ELIQUIS 5 MG PO TABS: 5 MG | ORAL | Qty: 1

## 2022-02-20 MED FILL — IPRATROPIUM-ALBUTEROL 0.5-2.5 (3) MG/3ML IN SOLN: RESPIRATORY_TRACT | Qty: 3

## 2022-02-20 MED FILL — SPIRONOLACTONE 25 MG PO TABS: 25 MG | ORAL | Qty: 1

## 2022-02-20 MED FILL — ERYTHROMYCIN 5 MG/GM OP OINT: 5 MG/GM | OPHTHALMIC | Qty: 1

## 2022-02-20 MED FILL — GUAIFENESIN 100 MG/5ML PO LIQD: 100 MG/5ML | ORAL | Qty: 20

## 2022-02-20 MED FILL — LOSARTAN POTASSIUM 50 MG PO TABS: 50 MG | ORAL | Qty: 2

## 2022-02-20 MED FILL — FUROSEMIDE 10 MG/ML IJ SOLN: 10 MG/ML | INTRAMUSCULAR | Qty: 4

## 2022-02-20 MED FILL — FLUCONAZOLE IN SODIUM CHLORIDE 400-0.9 MG/200ML-% IV SOLN: INTRAVENOUS | Qty: 200

## 2022-02-20 MED FILL — AMLODIPINE BESYLATE 5 MG PO TABS: 5 MG | ORAL | Qty: 2

## 2022-02-20 MED FILL — ROSUVASTATIN CALCIUM 5 MG PO TABS: 5 MG | ORAL | Qty: 1

## 2022-02-20 MED FILL — QUETIAPINE FUMARATE 25 MG PO TABS: 25 MG | ORAL | Qty: 1

## 2022-02-20 MED FILL — SODIUM CHLORIDE FLUSH 0.9 % IV SOLN: 0.9 % | INTRAVENOUS | Qty: 40

## 2022-02-20 MED FILL — MONTELUKAST SODIUM 10 MG PO TABS: 10 MG | ORAL | Qty: 1

## 2022-02-20 MED FILL — DOXY 100 100 MG IV SOLR: 100 MG | INTRAVENOUS | Qty: 100

## 2022-02-20 MED FILL — PANTOPRAZOLE SODIUM 40 MG PO TBEC: 40 MG | ORAL | Qty: 1

## 2022-02-20 NOTE — Plan of Care (Signed)
Problem: Respiratory - Adult  Goal: Achieves optimal ventilation and oxygenation  02/20/2022 2129 by Karie Fetch, RCP  Outcome: Progressing  02/20/2022 2121 by Karie Fetch, RCP  Outcome: Progressing  Flowsheets (Taken 02/20/2022 2044 by Reuel Boom, RN)  Achieves optimal ventilation and oxygenation: Assess for changes in respiratory status  02/20/2022 1601 by Shelbie Proctor, RN  Outcome: Progressing  02/20/2022 1233 by Thurston Pounds, RN  Outcome: Progressing

## 2022-02-20 NOTE — Plan of Care (Signed)
Problem: Respiratory - Adult  Goal: Achieves optimal ventilation and oxygenation  Outcome: Progressing     Problem: Discharge Planning  Goal: Discharge to home or other facility with appropriate resources  Outcome: Progressing     Problem: Safety - Adult  Goal: Free from fall injury  Outcome: Progressing     Problem: Chronic Conditions and Co-morbidities  Goal: Patient's chronic conditions and co-morbidity symptoms are monitored and maintained or improved  Outcome: Progressing     Problem: Pain  Goal: Verbalizes/displays adequate comfort level or baseline comfort level  Outcome: Progressing     Problem: Skin/Tissue Integrity  Goal: Absence of new skin breakdown  Description: 1.  Monitor for areas of redness and/or skin breakdown  2.  Assess vascular access sites hourly  3.  Every 4-6 hours minimum:  Change oxygen saturation probe site  4.  Every 4-6 hours:  If on nasal continuous positive airway pressure, respiratory therapy assess nares and determine need for appliance change or resting period.  Outcome: Progressing     Problem: Safety - Medical Restraint  Goal: Remains free of injury from restraints (Restraint for Interference with Medical Device)  Description: INTERVENTIONS:  1. Determine that other, less restrictive measures have been tried or would not be effective before applying the restraint  2. Evaluate the patient's condition at the time of restraint application  3. Inform patient/family regarding the reason for restraint  4. Q2H: Monitor safety, psychosocial status, comfort, nutrition and hydration  Outcome: Progressing     Problem: ABCDS Injury Assessment  Goal: Absence of physical injury  Outcome: Progressing

## 2022-02-20 NOTE — Progress Notes (Signed)
BiPAP Progress Note    Patient currently is On BiPAP  Patient tolerated BiPAP for the AM shift    BiPAP continuous until further orders    BiPAP settings are:  Serial #[id number]  IPAP:  15 cmH20   EPAP: 5 cmH2O  Rate:   8  FiO2:   35 %     BiPAP will be removed from room after 48 hours of non-use

## 2022-02-20 NOTE — Plan of Care (Signed)
Problem: Respiratory - Adult  Goal: Achieves optimal ventilation and oxygenation  02/20/2022 2121 by Karie Fetch, RCP  Outcome: Progressing  Flowsheets (Taken 02/20/2022 2044 by Reuel Boom, RN)  Achieves optimal ventilation and oxygenation: Assess for changes in respiratory status  02/20/2022 1601 by Shelbie Proctor, RN  Outcome: Progressing  02/20/2022 1233 by Thurston Pounds, RN  Outcome: Progressing

## 2022-02-20 NOTE — Progress Notes (Signed)
Progress Note    Patient: [name] [name]   Age:  71 y.o.  DOA: 02/13/2022   Admit Dx / CC:   LOS:  LOS: 6 days     Active Problems   1.  Septic shock likely due to pneumonia  2.  Acute hypoxic respiratory failure  3.  Hyperkalemia  4.  COPD with acute exacerbation  5.  DM type II controlled   6.  History of DVT  7.  Hypertension  8.  Macrocytic anemia likely iron deficiency  9.  Hypokalemia  10.  Hypomagnesemia  11.  Hypophosphatemia  Plan:   1.  Patient was seen and examined this morning.  This morning and then again feels better.  This morning patient was feeling fine  Was complaining of being tired.  Around noon time patient became extremely short of breath and diaphoretic.  Stat ABG was done which showed respiratory acidosis with elevated CO2 level.  Stat portable chest x-ray was ordered which showed left pleural effusion with possible pneumonia versus lung mass.  Patient was started on BiPAP  Patient was started on BiPAP and transferred to stepdown.    2.  For glycemic control patient will be continued on Glucomander protocol  3.  Patient was discussed with RN and respiratory therapist in detail.  4.  For anemia patient was started on iron infusion  5.  Total critical care time over 32 minutes  Subjective:   Complaining of shortness of breath extremely diaphoretic  Denies any chest pain had some nausea no fever  Objective:   Visit Vitals  BP (!) 159/103   Pulse 86   Temp 97.1 F (36.2 C) (Temporal)   Resp 24   Ht 5\' 7"  (1.702 m)   Wt 215 lb 6.2 oz (97.7 kg)   SpO2 97%   BMI 33.73 kg/m       Physical Exam:  General appearance: Elderly female well-built in moderate distress diaphoretic  Lungs: Crackles left lung base, decreased breath sounds left lung base  Heart: Regular rhythm, no murmur,   Abdomen: soft, non-tender. Bowel sounds Audible,  Extremities: Trace edema, distal pulses palpable  Neurologic: Awake and alert  Answering questions appropriately    Intake and Output:  Current Shift:  09/10 0701 - 09/10  1900  In: 100   Out: 1100 [Urine:1100]  Last three shifts:  09/08 1901 - 09/10 0700  In: -   Out: 2700 [Urine:2700]    Lab/Data Reviewed:  Recent Results (from the past 48 hour(s))   POCT Glucose    Collection Time: 02/18/22 11:38 PM   Result Value Ref Range    POC Glucose 182 (H) 65 - 105 mg/dL   CBC    Collection Time: 02/19/22  3:15 AM   Result Value Ref Range    WBC 14.7 (H) 4.0 - 11.0 1000/mm3    RBC 3.43 (L) 3.60 - 5.20 M/uL    Hemoglobin 9.3 (L) 11.0 - 16.0 gm/dl    Hematocrit 04/21/22 (L) 35.0 - 47.0 %    MCV 86.3 80.0 - 98.0 fL    MCH 27.1 25.4 - 34.6 pg    MCHC 31.4 30.0 - 36.0 gm/dl    Platelets 03.5 009 - 450 1000/mm3    MPV 12.5 (H) 6.0 - 10.0 fL    RDW 56.0 (H) 36.4 - 46.3     Comprehensive Metabolic Panel    Collection Time: 02/19/22  3:15 AM   Result Value Ref Range    Potassium  4.2 3.5 - 5.1 mEq/L    Chloride 104 98 - 107 mEq/L    Sodium 141 136 - 145 mEq/L    CO2 35 (H) 20 - 31 mEq/L    Glucose 191 (H) 74 - 106 mg/dl    BUN 14 9 - 23 mg/dl    Creatinine 1.610.61 0.960.55 - 1.02 mg/dl    GFR African American >60.0      GFR Non-African American >60      Calcium 8.6 (L) 8.7 - 10.4 mg/dl    Anion Gap 2 (L) 5 - 15 mmol/L    AST <8.0 0.0 - 33.9 U/L    ALT 7 (L) 10 - 49 U/L    Alkaline Phosphatase 58 46 - 116 U/L    Total Bilirubin 0.50 0.30 - 1.20 mg/dl    Total Protein 5.9 5.7 - 8.2 gm/dl    Albumin 3.0 (L) 3.4 - 5.0 gm/dl   Magnesium    Collection Time: 02/19/22  3:15 AM   Result Value Ref Range    Magnesium 1.7 1.6 - 2.6 mg/dL   Phosphorus    Collection Time: 02/19/22  3:15 AM   Result Value Ref Range    Phosphorus 2.7 2.4 - 5.1 mg/dL   POC EA5:WUJWJCG4:BLOOD GAS,LACTATE    Collection Time: 02/19/22  5:14 AM   Result Value Ref Range    Ph 7.447 7.350 - 7.450      PCO2 50.1 (H) 35.0 - 45.0 mm Hg    PO2 73.0 (L) 75 - 100 mm Hg    HCO3 34.7 (HH) 18.0 - 26.0 mmol/L    O2 Sat 95.0 90 - 100 %    Total CO2 36.0 (H) 24 - 29 mmol/L    Lactate 0.37 (L) 0.40 - 2.00 mmol/L    Base Excess 11 (H) -2 - 3 mmol/L    Pt Temp 36.7 C       Sample Type Art      Draw Site L Radial      Delivery Systems Nasal Can      Allen Test Pass      LITERS PER MINUTE 3     POCT Glucose    Collection Time: 02/19/22  6:31 AM   Result Value Ref Range    POC Glucose 236 (H) 65 - 105 mg/dL   POCT Glucose    Collection Time: 02/19/22 11:43 AM   Result Value Ref Range    POC Glucose 224 (H) 65 - 105 mg/dL   POCT Glucose    Collection Time: 02/19/22  4:25 PM   Result Value Ref Range    POC Glucose 198 (H) 65 - 105 mg/dL   POCT Glucose    Collection Time: 02/19/22  8:54 PM   Result Value Ref Range    POC Glucose 171 (H) 65 - 105 mg/dL   POCT Glucose    Collection Time: 02/20/22  1:48 AM   Result Value Ref Range    POC Glucose 200 (H) 65 - 105 mg/dL   CBC with Auto Differential    Collection Time: 02/20/22  6:37 AM   Result Value Ref Range    WBC 21.0 (H) 4.0 - 11.0 1000/mm3    RBC 3.72 3.60 - 5.20 M/uL    Hemoglobin 9.7 (L) 11.0 - 16.0 gm/dl    Hematocrit 19.131.3 (L) 35.0 - 47.0 %    MCV 84.1 80.0 - 98.0 fL    MCH 26.1 25.4 - 34.6 pg  MCHC 31.0 30.0 - 36.0 gm/dl    Platelets 841 324 - 450 1000/mm3    MPV 11.6 (H) 6.0 - 10.0 fL    RDW 57.4 (H) 36.4 - 46.3      Nucleated RBCs 0 0 - 0      Immature Granulocytes 0.7 0.0 - 3.0 %    Neutrophils Segmented 82.7 (H) 34 - 64 %    Lymphocytes 8.2 (L) 28 - 48 %    Monocytes 7.5 1 - 13 %    Eosinophils 0.8 0 - 5 %    Basophils 0.1 0 - 3 %   Comprehensive Metabolic Panel    Collection Time: 02/20/22  6:37 AM   Result Value Ref Range    Potassium 4.2 3.5 - 5.1 mEq/L    Chloride 102 98 - 107 mEq/L    Sodium 139 136 - 145 mEq/L    CO2 31 20 - 31 mEq/L    Glucose 155 (H) 74 - 106 mg/dl    BUN 19 9 - 23 mg/dl    Creatinine 4.01 0.27 - 1.02 mg/dl    GFR African American >60.0      GFR Non-African American >60      Calcium 9.0 8.7 - 10.4 mg/dl    Anion Gap 6 5 - 15 mmol/L    AST 12.0 0.0 - 33.9 U/L    ALT 15 10 - 49 U/L    Alkaline Phosphatase 58 46 - 116 U/L    Total Bilirubin 0.50 0.30 - 1.20 mg/dl    Total Protein 6.3 5.7 - 8.2 gm/dl     Albumin 3.0 (L) 3.4 - 5.0 gm/dl   POCT Glucose    Collection Time: 02/20/22  7:27 AM   Result Value Ref Range    POC Glucose 144 (H) 65 - 105 mg/dL   POCT Glucose    Collection Time: 02/20/22 10:59 AM   Result Value Ref Range    POC Glucose 181 (H) 65 - 105 mg/dL   POC OZ3:GUYQI GAS,LACTATE    Collection Time: 02/20/22 11:31 AM   Result Value Ref Range    Ph 7.288 (LL) 7.350 - 7.450      PCO2 70.6 (HH) 35.0 - 45.0 mm Hg    PO2 92.0 75 - 100 mm Hg    HCO3 33.8 (HH) 18.0 - 26.0 mmol/L    O2 Sat 96.0 90 - 100 %    Total CO2 36.0 (H) 24 - 29 mmol/L    Lactate 0.97 0.40 - 2.00 mmol/L    Base Excess 7 (H) -2 - 3 mmol/L    Sample Type Art      Draw Site R Radial      Delivery Systems Nasal Can      Allen Test Pass      LITERS PER MINUTE 4     POCT Glucose    Collection Time: 02/20/22 11:41 AM   Result Value Ref Range    POC Glucose 170 (H) 65 - 105 mg/dL   POCT Glucose    Collection Time: 02/20/22 12:14 PM   Result Value Ref Range    POC Glucose 156 (H) 65 - 105 mg/dL   POCT Glucose    Collection Time: 02/20/22  5:16 PM   Result Value Ref Range    POC Glucose 171 (H) 65 - 105 mg/dL   POC HK7:QQVZD GAS,LACTATE    Collection Time: 02/20/22  5:19 PM   Result Value Ref Range  Ph 7.363 7.350 - 7.450      PCO2 55.9 (H) 35.0 - 45.0 mm Hg    PO2 70.0 (L) 75 - 100 mm Hg    HCO3 31.8 (HH) 18.0 - 26.0 mmol/L    O2 Sat 93.0 90 - 100 %    Total CO2 33.0 (H) 24 - 29 mmol/L    Lactate <0.30 (L) 0.40 - 2.00 mmol/L    Base Excess 6 (H) -2 - 3 mmol/L    Sample Type Art      FIO2 35      Draw Site L Radial      Delivery Systems BiPAP      Allen Test Pass      IPAP cmH2O 15      EPAP chH2O 5      Set Rate 8         Imaging:  [email or web address] @    Medications Reviewed:  Current Facility-Administered Medications   Medication Dose Route Frequency    guaiFENesin (ROBITUSSIN) 100 MG/5ML liquid 400 mg  400 mg Oral TID    LORazepam (ATIVAN) injection 0.5 mg  0.5 mg IntraVENous Q6H PRN    metoprolol succinate (TOPROL XL) extended release tablet 50 mg  50  mg Oral Daily    pantoprazole (PROTONIX) tablet 40 mg  40 mg Oral QAM AC    spironolactone (ALDACTONE) tablet 25 mg  25 mg Oral Daily    albuterol (PROVENTIL) (2.5 MG/3ML) 0.083% nebulizer solution 2.5 mg  2.5 mg Nebulization Q2H PRN    potassium chloride 20 mEq/50 mL IVPB (Central Line)  20 mEq IntraVENous PRN    Or    potassium chloride 10 mEq/100 mL IVPB (Peripheral Line)  10 mEq IntraVENous PRN    magnesium sulfate 2000 mg in 50 mL IVPB premix  2,000 mg IntraVENous PRN    magnesium oxide (MAG-OX) tablet 400 mg  400 mg Oral BID    potassium chloride 20 mEq in 100 mL IVPB  20 mEq IntraVENous Once    apixaban (ELIQUIS) tablet 5 mg  5 mg Oral BID    amLODIPine (NORVASC) tablet 10 mg  10 mg Oral Daily    losartan (COZAAR) tablet 100 mg  100 mg Oral Daily    sodium hypochlorite (DAKINS) 0.125 % external solution   Topical Daily    fluconazole (DIFLUCAN) in 0.9 % sodium chloride IVPB 400 mg  400 mg IntraVENous Q24H    silver sulfADIAZINE (SILVADENE) 1 % cream   Topical BID    erythromycin (ROMYCIN) ophthalmic ointment   Both Eyes 3 times per day    prochlorperazine (COMPAZINE) injection 10 mg  10 mg IntraVENous Q6H PRN    insulin lispro (HUMALOG) injection vial 1-100 Units  1-100 Units SubCUTAneous 4x Daily AC & HS    QUEtiapine (SEROQUEL) tablet 25 mg  25 mg Oral Nightly    sodium chloride flush 0.9 % injection 5-40 mL  5-40 mL IntraVENous BID    fluticasone furoate-vilanterol (BREO ELLIPTA) 100-25 MCG/ACT inhaler 1 puff  1 puff Inhalation Daily    montelukast (SINGULAIR) tablet 10 mg  10 mg Oral Daily    rosuvastatin (CRESTOR) tablet 5 mg  5 mg Oral Nightly    piperacillin-tazobactam (ZOSYN) 3,375 mg in sodium chloride 0.9 % 100 mL IVPB (mini-bag)  3,375 mg IntraVENous Q8H    insulin glargine (LANTUS) injection vial 1-100 Units  1-100 Units SubCUTAneous QHS    sodium chloride flush 0.9 % injection 5-40 mL  5-40 mL IntraVENous 2  times per day    sodium chloride flush 0.9 % injection 5-40 mL  5-40 mL IntraVENous PRN     0.9 % sodium chloride infusion   IntraVENous PRN    ondansetron (ZOFRAN-ODT) disintegrating tablet 4 mg  4 mg Oral Q8H PRN    Or    ondansetron (ZOFRAN) injection 4 mg  4 mg IntraVENous Q6H PRN    polyethylene glycol (GLYCOLAX) packet 17 g  17 g Oral Daily PRN    acetaminophen (TYLENOL) tablet 650 mg  650 mg Oral Q6H PRN    Or    acetaminophen (TYLENOL) suppository 650 mg  650 mg Rectal Q6H PRN    doxycycline (VIBRAMYCIN) 100 mg in sodium chloride 0.9 % 100 mL IVPB (mini-bag)  100 mg IntraVENous Q12H    glucagon (rDNA) injection 1 mg  1 mg IntraMUSCular PRN    dextrose 50 % IV solution  20-30 mL IntraVENous PRN    ipratropium 0.5 mg-albuterol 2.5 mg (DUONEB) nebulizer solution 1 Dose  1 Dose Inhalation TID RT         Dragon medical dictation software was used for portions of this report. Unintended errors may occur.   [name], MD  P# [phone]  February 20, 2022

## 2022-02-20 NOTE — Progress Notes (Signed)
Patient pulled out IJ.

## 2022-02-20 NOTE — Progress Notes (Signed)
BiPAP Progress Note    Patient currently is Off BiPAP  Patient wore intermittently BiPAP for the AM shift    BiPAP QHS    BiPAP settings are:  Serial #[id number]  IPAP:  15 cmH20   EPAP: 5 cmH2O  Rate:   8  FiO2:   28 %     BiPAP will be removed from room after 48 hours of non-use     Off currently for dinner

## 2022-02-20 NOTE — Progress Notes (Signed)
Serial #[id number]  IPAP:  15 cmH20   EPAP: 5 cmH2O  Rate:   10  FiO2:   32 % BiPAP Progress Note    Patient currently is On BiPAP  Patient tolerated BiPAP for the AM shift    BiPAP continuous until further orders    BiPAP settings are:  Serial #[id number]  IPAP:  15 cmH20   EPAP: 5 cmH2O  Rate:   10  FiO2:   32 %     BiPAP will be removed from room after 48 hours of non-use

## 2022-02-20 NOTE — Progress Notes (Signed)
Remote note.  Chart reviewed. Notes, labs and imaging reviewed as needed.    ID following for Bacteremia, Fungemia, UTI, Pleural Effusion, and Large bullous disease    -Remains afebrile, elevated BP otherwise stable vital signs on 2 LNC  -Elevated white count-was on steroids - no labs yet today  -Low but stable H&H, normal platelets  -Normal renal function and LFTs  -History of hep but non reactive HIV    -Blood cultures 1/2 growing E faecalis and C albicans  -Urine culture with lactobacillus sp, E. coli, Pseudomonas, E faecalis and Candida glabrata  -Suspect BSI secondary to UTI  -Renal ultrasound on 9/6 showing right kidney kidney with multiple cysts and mild hydro, left kidney with 1 cysts no calculi or hydro  -2D echo without any obvious valve vegetation  -Repeat Blcx 9/4 NGTD and 9/5 from CVL NGTD   -CVL removed by patient  9/10    -9/5 S/p thoracentesis with benign appearing fluid with 325 white cells, 93 LDH, less than 2 protein and negative culture  -Urine antigens negative  -Fungal markers with neg Aspergillus Ag and fungitell  -9/7 CXR stable left apical bullae and bilateral pleural effusions and airspace disease, no new focal airspace disease    -Continue IV Zosyn and Doxy for now (Day 7)  -Continue fluconazole (Day 5 of antifugnal)  -Plan 7 days of Abx for PNA but will need 10-14 days for BSI.  Consider switching to oral when ready for discharge but likely will finish course here  -Off Steroids now  -Aspiration precautions    -Monitor labs as needed  -Monitor for any ADE  -Discussed plan of care with Dr. Elijio Miles      Please call me if you have any question or concern. We will check back on Monday.  Thank you.    Evlyn Courier, APRN - NP  February 20, 2022    [facility]   Office Phone:[phone]  Fax:[phone]

## 2022-02-21 LAB — CBC WITH AUTO DIFFERENTIAL
Basophils: 0.2 % (ref 0–3)
Eosinophils: 1 % (ref 0–5)
Hematocrit: 33.6 % — ABNORMAL LOW (ref 35.0–47.0)
Hemoglobin: 10.2 gm/dl — ABNORMAL LOW (ref 11.0–16.0)
Immature Granulocytes: 0.3 % (ref 0.0–3.0)
Lymphocytes: 7.8 % — ABNORMAL LOW (ref 28–48)
MCH: 26.6 pg (ref 25.4–34.6)
MCHC: 30.4 gm/dl (ref 30.0–36.0)
MCV: 87.5 fL (ref 80.0–98.0)
MPV: 11.7 fL — ABNORMAL HIGH (ref 6.0–10.0)
Monocytes: 8.2 % (ref 1–13)
Neutrophils Segmented: 82.5 % — ABNORMAL HIGH (ref 34–64)
Nucleated RBCs: 0 (ref 0–0)
Platelets: 167 10*3/uL (ref 140–450)
RBC: 3.84 M/uL (ref 3.60–5.20)
RDW: 59.9 — ABNORMAL HIGH (ref 36.4–46.3)
WBC: 20.8 10*3/uL — ABNORMAL HIGH (ref 4.0–11.0)

## 2022-02-21 LAB — EKG 12-LEAD
Atrial Rate: 112 {beats}/min
Calculated P Axis: 57 degrees
Calculated R Axis: 60 degrees
Calculated T Axis: 51 degrees
P-R Interval: 172 ms
Q-T Interval: 318 ms
QRS Duration: 72 ms
QTC Calculation (Bezet): 434 ms
Ventricular Rate: 112 {beats}/min

## 2022-02-21 LAB — POCT GLUCOSE
POC Glucose: 213 mg/dL — ABNORMAL HIGH (ref 65–105)
POC Glucose: 179 mg/dL — ABNORMAL HIGH (ref 65–105)
POC Glucose: 195 mg/dL — ABNORMAL HIGH (ref 65–105)
POC Glucose: 289 mg/dL — ABNORMAL HIGH (ref 65–105)
POC Glucose: 248 mg/dL — ABNORMAL HIGH (ref 65–105)

## 2022-02-21 LAB — COMPREHENSIVE METABOLIC PANEL
ALT: 10 U/L (ref 10–49)
AST: 9 U/L (ref 0.0–33.9)
Albumin: 3.2 gm/dl — ABNORMAL LOW (ref 3.4–5.0)
Alkaline Phosphatase: 62 U/L (ref 46–116)
Anion Gap: 5 mmol/L (ref 5–15)
BUN: 18 mg/dl (ref 9–23)
CO2: 32 mEq/L — ABNORMAL HIGH (ref 20–31)
Calcium: 9.2 mg/dl (ref 8.7–10.4)
Chloride: 104 mEq/L (ref 98–107)
Creatinine: 0.59 mg/dl (ref 0.55–1.02)
GFR African American: >60
GFR Non-African American: >60
Glucose: 161 mg/dl — ABNORMAL HIGH (ref 74–106)
Potassium: 4.4 mEq/L (ref 3.5–5.1)
Sodium: 141 mEq/L (ref 136–145)
Total Bilirubin: 0.5 mg/dl (ref 0.30–1.20)
Total Protein: 6.3 gm/dl (ref 5.7–8.2)

## 2022-02-21 LAB — CULTURE, BLOOD 1: BLOOD CULTURE RESULT: NO GROWTH

## 2022-02-21 LAB — MAGNESIUM: Magnesium: 1.7 mg/dL (ref 1.6–2.6)

## 2022-02-21 LAB — PHOSPHORUS: Phosphorus: 3.8 mg/dL (ref 2.4–5.1)

## 2022-02-21 MED ORDER — ACETYLCYSTEINE 10 % IN SOLN
10 % | Freq: Four times a day (QID) | RESPIRATORY_TRACT | Status: AC
Start: 2022-02-21 — End: 2022-02-23
  Administered 2022-02-22 – 2022-02-23 (×5): 400 mL via RESPIRATORY_TRACT

## 2022-02-21 MED ORDER — ALBUTEROL SULFATE (2.5 MG/3ML) 0.083% IN NEBU
Freq: Four times a day (QID) | RESPIRATORY_TRACT | Status: DC
Start: 2022-02-21 — End: 2022-02-21
  Administered 2022-02-21 (×2): 2.5 mg via RESPIRATORY_TRACT

## 2022-02-21 MED ORDER — ACETYLCYSTEINE 10 % IN SOLN
10 % | Freq: Four times a day (QID) | RESPIRATORY_TRACT | Status: DC
Start: 2022-02-21 — End: 2022-02-21
  Administered 2022-02-21 (×2): 400 mL via RESPIRATORY_TRACT

## 2022-02-21 MED ORDER — ALBUTEROL SULFATE (2.5 MG/3ML) 0.083% IN NEBU
Freq: Four times a day (QID) | RESPIRATORY_TRACT | Status: AC
Start: 2022-02-21 — End: ?
  Administered 2022-02-22 – 2022-02-26 (×19): 2.5 mg via RESPIRATORY_TRACT

## 2022-02-21 MED FILL — FLUCONAZOLE IN SODIUM CHLORIDE 400-0.9 MG/200ML-% IV SOLN: INTRAVENOUS | Qty: 200

## 2022-02-21 MED FILL — HUMALOG 100 UNIT/ML IJ SOLN: 100 UNIT/ML | INTRAMUSCULAR | Qty: 1

## 2022-02-21 MED FILL — MONTELUKAST SODIUM 10 MG PO TABS: 10 MG | ORAL | Qty: 1

## 2022-02-21 MED FILL — PIPERACILLIN SOD-TAZOBACTAM SO 3.375 (3-0.375) G IV SOLR: 3.375 (3-0.375) g | INTRAVENOUS | Qty: 3375

## 2022-02-21 MED FILL — BREO ELLIPTA 100-25 MCG/ACT IN AEPB: 100-25 MCG/ACT | RESPIRATORY_TRACT | Qty: 28

## 2022-02-21 MED FILL — DOXY 100 100 MG IV SOLR: 100 MG | INTRAVENOUS | Qty: 100

## 2022-02-21 MED FILL — METOPROLOL SUCCINATE ER 50 MG PO TB24: 50 MG | ORAL | Qty: 1

## 2022-02-21 MED FILL — LANTUS 100 UNIT/ML SC SOLN: 100 UNIT/ML | SUBCUTANEOUS | Qty: 1

## 2022-02-21 MED FILL — SODIUM CHLORIDE FLUSH 0.9 % IV SOLN: 0.9 % | INTRAVENOUS | Qty: 40

## 2022-02-21 MED FILL — MAGNESIUM OXIDE -MG SUPPLEMENT 400 (240 MG) MG PO TABS: 400 (240 Mg) MG | ORAL | Qty: 1

## 2022-02-21 MED FILL — GUAIFENESIN 100 MG/5ML PO LIQD: 100 MG/5ML | ORAL | Qty: 20

## 2022-02-21 MED FILL — LORAZEPAM 2 MG/ML IJ SOLN: 2 MG/ML | INTRAMUSCULAR | Qty: 1

## 2022-02-21 MED FILL — DAKINS (1/4 STRENGTH) 0.125 % EX SOLN: 0.125 % | CUTANEOUS | Qty: 473

## 2022-02-21 MED FILL — SPIRONOLACTONE 25 MG PO TABS: 25 MG | ORAL | Qty: 1

## 2022-02-21 MED FILL — AMLODIPINE BESYLATE 5 MG PO TABS: 5 MG | ORAL | Qty: 2

## 2022-02-21 MED FILL — ALBUTEROL SULFATE (2.5 MG/3ML) 0.083% IN NEBU: RESPIRATORY_TRACT | Qty: 3

## 2022-02-21 MED FILL — LOSARTAN POTASSIUM 50 MG PO TABS: 50 MG | ORAL | Qty: 2

## 2022-02-21 MED FILL — ELIQUIS 5 MG PO TABS: 5 MG | ORAL | Qty: 1

## 2022-02-21 MED FILL — IPRATROPIUM-ALBUTEROL 0.5-2.5 (3) MG/3ML IN SOLN: RESPIRATORY_TRACT | Qty: 3

## 2022-02-21 MED FILL — QUETIAPINE FUMARATE 25 MG PO TABS: 25 MG | ORAL | Qty: 1

## 2022-02-21 MED FILL — ACETYLCYSTEINE 10 % IN SOLN: 10 % | RESPIRATORY_TRACT | Qty: 4

## 2022-02-21 MED FILL — ROSUVASTATIN CALCIUM 5 MG PO TABS: 5 MG | ORAL | Qty: 1

## 2022-02-21 MED FILL — PANTOPRAZOLE SODIUM 40 MG PO TBEC: 40 MG | ORAL | Qty: 1

## 2022-02-21 NOTE — Progress Notes (Signed)
Progress Note    Patient: [name] [name]   Age:  71 y.o.  DOA: 02/13/2022   Admit Dx / CC:   LOS:  LOS: 7 days     Active Problems   1.  Septic shock likely due to pneumonia  2.  Acute hypoxic and hypercapnic respiratory failure  3.  Hyperkalemia  4.  COPD with acute exacerbation  5.  DM type II controlled   6.  History of DVT  7.  Hypertension  8.  Macrocytic anemia likely iron deficiency  9.  Hypokalemia  10.  Hypomagnesemia  11.  Hypophosphatemia  Plan:   1.  Patient was seen and examined this morning.  Feeling little better  Patient was encouraged incentive spirometry every 2 hourly.  Patient has a history of CO2 retention we will decrease oxygen  To 2 L if possible goal is to keep oxygen saturation between 89 to 92% no need to get oxygen saturation above 92%  2.  For glycemic control patient will be continued on Glucomander protocol  3.  Patient was discussed with RN and respiratory therapist in detail.  4.  For anemia patient was started on iron infusion  5.  PT/OT consult done patient was encouraged to participate  6.  Patient's condition and management plan discussed with patient's husband in detail.  Patient be evaluated for skilled nursing facility  Subjective:   Feeling little better today  Objective:   Visit Vitals  BP (!) 136/90   Pulse 87   Temp 97.4 F (36.3 C)   Resp 30   Ht 5\' 7"  (1.702 m)   Wt 215 lb 6.2 oz (97.7 kg)   SpO2 (!) 85%   BMI 33.73 kg/m       Physical Exam:  General appearance: Elderly female well-built in moderate distress   Looking sick  Lungs: Coarse breath sounds, scattered rhonchi's  Heart: Regular rhythm, no murmur,   Abdomen: soft, non-tender. Bowel sounds Audible,  Extremities: Trace edema, distal pulses palpable  Neurologic: Awake and alert  Answering questions appropriately    Intake and Output:  Current Shift:  No intake/output data recorded.  Last three shifts:  09/10 0701 - 09/11 1900  In: 160 [P.O.:60]  Out: 3900 [Urine:3900]    Lab/Data Reviewed:  Recent Results (from the  past 48 hour(s))   POCT Glucose    Collection Time: 02/19/22  8:54 PM   Result Value Ref Range    POC Glucose 171 (H) 65 - 105 mg/dL   POCT Glucose    Collection Time: 02/20/22  1:48 AM   Result Value Ref Range    POC Glucose 200 (H) 65 - 105 mg/dL   CBC with Auto Differential    Collection Time: 02/20/22  6:37 AM   Result Value Ref Range    WBC 21.0 (H) 4.0 - 11.0 1000/mm3    RBC 3.72 3.60 - 5.20 M/uL    Hemoglobin 9.7 (L) 11.0 - 16.0 gm/dl    Hematocrit 04/22/22 (L) 35.0 - 47.0 %    MCV 84.1 80.0 - 98.0 fL    MCH 26.1 25.4 - 34.6 pg    MCHC 31.0 30.0 - 36.0 gm/dl    Platelets 08.6 761 - 450 1000/mm3    MPV 11.6 (H) 6.0 - 10.0 fL    RDW 57.4 (H) 36.4 - 46.3      Nucleated RBCs 0 0 - 0      Immature Granulocytes 0.7 0.0 - 3.0 %  Neutrophils Segmented 82.7 (H) 34 - 64 %    Lymphocytes 8.2 (L) 28 - 48 %    Monocytes 7.5 1 - 13 %    Eosinophils 0.8 0 - 5 %    Basophils 0.1 0 - 3 %   Comprehensive Metabolic Panel    Collection Time: 02/20/22  6:37 AM   Result Value Ref Range    Potassium 4.2 3.5 - 5.1 mEq/L    Chloride 102 98 - 107 mEq/L    Sodium 139 136 - 145 mEq/L    CO2 31 20 - 31 mEq/L    Glucose 155 (H) 74 - 106 mg/dl    BUN 19 9 - 23 mg/dl    Creatinine 6.94 8.54 - 1.02 mg/dl    GFR African American >60.0      GFR Non-African American >60      Calcium 9.0 8.7 - 10.4 mg/dl    Anion Gap 6 5 - 15 mmol/L    AST 12.0 0.0 - 33.9 U/L    ALT 15 10 - 49 U/L    Alkaline Phosphatase 58 46 - 116 U/L    Total Bilirubin 0.50 0.30 - 1.20 mg/dl    Total Protein 6.3 5.7 - 8.2 gm/dl    Albumin 3.0 (L) 3.4 - 5.0 gm/dl   POCT Glucose    Collection Time: 02/20/22  7:27 AM   Result Value Ref Range    POC Glucose 144 (H) 65 - 105 mg/dL   POCT Glucose    Collection Time: 02/20/22 10:59 AM   Result Value Ref Range    POC Glucose 181 (H) 65 - 105 mg/dL   EKG 12 Lead    Collection Time: 02/20/22 11:09 AM   Result Value Ref Range    Ventricular Rate 112 BPM    Atrial Rate 112 BPM    P-R Interval 172 ms    QRS Duration 72 ms    Q-T Interval 318  ms    QTC Calculation (Bezet) 434 ms    Calculated P Axis 57 degrees    Calculated R Axis 60 degrees    Calculated T Axis 51 degrees    DIAGNOSIS, 93000       Sinus tachycardia  Otherwise normal ECG  When compared with ECG of 13-Feb-2022 23:50,  Vent. rate has increased BY  40 BPM  Confirmed by Avon Gully, M.D., Jomarie Longs [phone]) on 02/21/2022 8:28:18 AM     POC CG4:BLOOD GAS,LACTATE    Collection Time: 02/20/22 11:31 AM   Result Value Ref Range    Ph 7.288 (LL) 7.350 - 7.450      PCO2 70.6 (HH) 35.0 - 45.0 mm Hg    PO2 92.0 75 - 100 mm Hg    HCO3 33.8 (HH) 18.0 - 26.0 mmol/L    O2 Sat 96.0 90 - 100 %    Total CO2 36.0 (H) 24 - 29 mmol/L    Lactate 0.97 0.40 - 2.00 mmol/L    Base Excess 7 (H) -2 - 3 mmol/L    Sample Type Art      Draw Site R Radial      Delivery Systems Nasal Can      Allen Test Pass      LITERS PER MINUTE 4     POCT Glucose    Collection Time: 02/20/22 11:41 AM   Result Value Ref Range    POC Glucose 170 (H) 65 - 105 mg/dL   POCT Glucose    Collection Time:  02/20/22 12:14 PM   Result Value Ref Range    POC Glucose 156 (H) 65 - 105 mg/dL   POCT Glucose    Collection Time: 02/20/22  5:16 PM   Result Value Ref Range    POC Glucose 171 (H) 65 - 105 mg/dL   POC AY3:KZSWF GAS,LACTATE    Collection Time: 02/20/22  5:19 PM   Result Value Ref Range    Ph 7.363 7.350 - 7.450      PCO2 55.9 (H) 35.0 - 45.0 mm Hg    PO2 70.0 (L) 75 - 100 mm Hg    HCO3 31.8 (HH) 18.0 - 26.0 mmol/L    O2 Sat 93.0 90 - 100 %    Total CO2 33.0 (H) 24 - 29 mmol/L    Lactate <0.30 (L) 0.40 - 2.00 mmol/L    Base Excess 6 (H) -2 - 3 mmol/L    Sample Type Art      FIO2 35      Draw Site L Radial      Delivery Systems BiPAP      Allen Test Pass      IPAP cmH2O 15      EPAP chH2O 5      Set Rate 8     POCT Glucose    Collection Time: 02/20/22  9:55 PM   Result Value Ref Range    POC Glucose 213 (H) 65 - 105 mg/dL   POCT Glucose    Collection Time: 02/21/22 12:05 AM   Result Value Ref Range    POC Glucose 179 (H) 65 - 105 mg/dL   CBC with Auto  Differential    Collection Time: 02/21/22  2:04 AM   Result Value Ref Range    WBC 20.8 (H) 4.0 - 11.0 1000/mm3    RBC 3.84 3.60 - 5.20 M/uL    Hemoglobin 10.2 (L) 11.0 - 16.0 gm/dl    Hematocrit 09.3 (L) 35.0 - 47.0 %    MCV 87.5 80.0 - 98.0 fL    MCH 26.6 25.4 - 34.6 pg    MCHC 30.4 30.0 - 36.0 gm/dl    Platelets 235 573 - 450 1000/mm3    MPV 11.7 (H) 6.0 - 10.0 fL    RDW 59.9 (H) 36.4 - 46.3      Nucleated RBCs 0 0 - 0      Immature Granulocytes 0.3 0.0 - 3.0 %    Neutrophils Segmented 82.5 (H) 34 - 64 %    Lymphocytes 7.8 (L) 28 - 48 %    Monocytes 8.2 1 - 13 %    Eosinophils 1.0 0 - 5 %    Basophils 0.2 0 - 3 %   Comprehensive Metabolic Panel    Collection Time: 02/21/22  2:04 AM   Result Value Ref Range    Potassium 4.4 3.5 - 5.1 mEq/L    Chloride 104 98 - 107 mEq/L    Sodium 141 136 - 145 mEq/L    CO2 32 (H) 20 - 31 mEq/L    Glucose 161 (H) 74 - 106 mg/dl    BUN 18 9 - 23 mg/dl    Creatinine 2.20 2.54 - 1.02 mg/dl    GFR African American >60.0      GFR Non-African American >60      Calcium 9.2 8.7 - 10.4 mg/dl    Anion Gap 5 5 - 15 mmol/L    AST 9.0 0.0 - 33.9 U/L    ALT 10  10 - 49 U/L    Alkaline Phosphatase 62 46 - 116 U/L    Total Bilirubin 0.50 0.30 - 1.20 mg/dl    Total Protein 6.3 5.7 - 8.2 gm/dl    Albumin 3.2 (L) 3.4 - 5.0 gm/dl   Magnesium    Collection Time: 02/21/22  2:04 AM   Result Value Ref Range    Magnesium 1.7 1.6 - 2.6 mg/dL   Phosphorus    Collection Time: 02/21/22  2:04 AM   Result Value Ref Range    Phosphorus 3.8 2.4 - 5.1 mg/dL   POCT Glucose    Collection Time: 02/21/22  8:37 AM   Result Value Ref Range    POC Glucose 195 (H) 65 - 105 mg/dL   POCT Glucose    Collection Time: 02/21/22 11:53 AM   Result Value Ref Range    POC Glucose 289 (H) 65 - 105 mg/dL   POCT Glucose    Collection Time: 02/21/22  4:45 PM   Result Value Ref Range    POC Glucose 248 (H) 65 - 105 mg/dL       Imaging:  [email or web address] @    Medications Reviewed:  Current Facility-Administered Medications   Medication Dose Route  Frequency    acetylcysteine (MUCOMYST) 10 % solution 400 mg  4 mL Inhalation Q6H    albuterol (PROVENTIL) (2.5 MG/3ML) 0.083% nebulizer solution 2.5 mg  2.5 mg Nebulization Q6H    guaiFENesin (ROBITUSSIN) 100 MG/5ML liquid 400 mg  400 mg Oral TID    LORazepam (ATIVAN) injection 0.5 mg  0.5 mg IntraVENous Q6H PRN    metoprolol succinate (TOPROL XL) extended release tablet 50 mg  50 mg Oral Daily    pantoprazole (PROTONIX) tablet 40 mg  40 mg Oral QAM AC    spironolactone (ALDACTONE) tablet 25 mg  25 mg Oral Daily    potassium chloride 20 mEq/50 mL IVPB (Central Line)  20 mEq IntraVENous PRN    Or    potassium chloride 10 mEq/100 mL IVPB (Peripheral Line)  10 mEq IntraVENous PRN    magnesium sulfate 2000 mg in 50 mL IVPB premix  2,000 mg IntraVENous PRN    magnesium oxide (MAG-OX) tablet 400 mg  400 mg Oral BID    potassium chloride 20 mEq in 100 mL IVPB  20 mEq IntraVENous Once    apixaban (ELIQUIS) tablet 5 mg  5 mg Oral BID    amLODIPine (NORVASC) tablet 10 mg  10 mg Oral Daily    losartan (COZAAR) tablet 100 mg  100 mg Oral Daily    sodium hypochlorite (DAKINS) 0.125 % external solution   Topical Daily    fluconazole (DIFLUCAN) in 0.9 % sodium chloride IVPB 400 mg  400 mg IntraVENous Q24H    silver sulfADIAZINE (SILVADENE) 1 % cream   Topical BID    erythromycin (ROMYCIN) ophthalmic ointment   Both Eyes 3 times per day    prochlorperazine (COMPAZINE) injection 10 mg  10 mg IntraVENous Q6H PRN    insulin lispro (HUMALOG) injection vial 1-100 Units  1-100 Units SubCUTAneous 4x Daily AC & HS    QUEtiapine (SEROQUEL) tablet 25 mg  25 mg Oral Nightly    sodium chloride flush 0.9 % injection 5-40 mL  5-40 mL IntraVENous BID    fluticasone furoate-vilanterol (BREO ELLIPTA) 100-25 MCG/ACT inhaler 1 puff  1 puff Inhalation Daily    montelukast (SINGULAIR) tablet 10 mg  10 mg Oral Daily    rosuvastatin (CRESTOR) tablet 5 mg  5 mg Oral Nightly    piperacillin-tazobactam (ZOSYN) 3,375 mg in sodium chloride 0.9 % 100 mL IVPB  (mini-bag)  3,375 mg IntraVENous Q8H    insulin glargine (LANTUS) injection vial 1-100 Units  1-100 Units SubCUTAneous QHS    sodium chloride flush 0.9 % injection 5-40 mL  5-40 mL IntraVENous PRN    0.9 % sodium chloride infusion   IntraVENous PRN    ondansetron (ZOFRAN-ODT) disintegrating tablet 4 mg  4 mg Oral Q8H PRN    Or    ondansetron (ZOFRAN) injection 4 mg  4 mg IntraVENous Q6H PRN    polyethylene glycol (GLYCOLAX) packet 17 g  17 g Oral Daily PRN    acetaminophen (TYLENOL) tablet 650 mg  650 mg Oral Q6H PRN    Or    acetaminophen (TYLENOL) suppository 650 mg  650 mg Rectal Q6H PRN    glucagon (rDNA) injection 1 mg  1 mg IntraMUSCular PRN    dextrose 50 % IV solution  20-30 mL IntraVENous PRN         Dragon medical dictation software was used for portions of this report. Unintended errors may occur.   Hazel SamsAHIR Haydee Jabbour, MD  P# [phone]  February 21, 2022

## 2022-02-21 NOTE — Progress Notes (Signed)
BiPAP Progress Note    Patient currently is Off BiPAP  Patient off BiPAP for the PM shift    Pt wore BIPAP from 2100-0130. Pt then refused to wear BIPAP. Will continue to encourage patient use.    BiPAP settings are:  Serial #[id number]  IPAP:  18 cmH20   EPAP: 8 cmH2O  Rate:   12  FiO2:   35 %

## 2022-02-21 NOTE — Progress Notes (Signed)
Physical Therapy  PHYSICAL THERAPY    Time  PT Charge Capture  Rehab Caseload Tracker       Patient: [name] [name] (71 y.o. female)  Room: 5310/5310    Primary Diagnosis: Acute hyperkalemia [E87.5]  Hyperglycemia [R73.9]  Septic shock (HCC) [A41.9, R65.21]  Acute respiratory failure with hypoxia and hypercapnia (HCC) [J96.01, J96.02]  Community acquired pneumonia of left lung, unspecified part of lung [J18.9]       Date of Admission: 02/13/2022   Insurance: Payor: [employer] MEDICARE / Plan: [employer] GOLD PLUS HMO / Product Type: *No Product type* /      Date: 02/21/2022  Time: 8:25    OBJECTIVE:     Orders, labs, and chart reviewed on [name] [name]. Discussed with patient's nurse (patient ok to be seen by PT).    Patient was not seen for skilled physical therapy evaluation, secondary to  pt receiving nursing care, taking meds .    PLAN:     Our services will follow up as patient's condition and/or schedule permit.    Heide Guile, PT  February 21, 2022
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
## 2022-02-21 NOTE — Progress Notes (Signed)
Patient refusing to wear BIPAP, and asked RN to remove. No patient distress noted at this time.

## 2022-02-21 NOTE — Progress Notes (Signed)
OCCUPATIONAL THERAPY EVALUATION   Acknowledge Orders  Time  OT Charge Capture  Rehab Caseload Tracker    [facility]	[place] AM-PAC "6 Clicks" Daily Activity Inpatient Short Form  -    Patient: [name] [name] 71 y.o. female)  Room: 5310/5310    Primary Diagnosis: Acute hyperkalemia [E87.5]  Hyperglycemia [R73.9]  Septic shock (HCC) [A41.9, R65.21]  Acute respiratory failure with hypoxia and hypercapnia (HCC) [J96.01, J96.02]  Community acquired pneumonia of left lung, unspecified part of lung [J18.9]       Date of Admission: 02/13/2022   Length of Stay:  7 day(s)  Insurance: Payor: [employer] MEDICARE / Plan: [place] / Product Type: *No Product type* /      Date: 02/21/2022  Time In: 0941        Time Out: 1008       Total Minutes: 27   Treatment time: 15 minutes    Isolation:  Contact       MDRO: MRSA    Precautions: falls, pressure injury risk, decreased cognition   Ordered weight bearing status: no weight bearing restrictions    Current diet order: ADULT DIET; Dysphagia - Minced and Moist  ADULT ORAL NUTRITION SUPPLEMENT; Breakfast, Dinner, Lunch; Standard 4 oz Oral Supplement    ASSESSMENT   Co-session with PT to ensure pt/staff safety and to assist pt in working towards functional goals. OT focus on ADL's and PT focus on mobility.     Based on the objective data described below, the patient presents with     -  limited or no active participation in functional mobility/OOB activities due to pain and fatigue. Pt declining to participate in bed mobility or sit at EOB despite encouragement  -  decreased independence/ability to perform basic ADLs/IADLs  -  decreased independence in functional mobility   -  decreased activity tolerance   -  deconditioning  -  complaint of pain in left  lower extremity   -  decreased cognition   -  decreased flexibility    -Pt oriented to self and year.  -minimal verbalization  -decreased initiation   -total assist for repositioning in bed to change wedges/ pillows under R  side  -able to functionally grasp beverage and bring to mouth, drink from straw w/ SU and cues to initiate  -very briefly able to wipe around mouth w/ R hand to assist with face washing. Would require max A for thoroughness   -declining to sit at EOB or don offloading boots despite education  -reported feeling tired    *will participate in 1 week trial to assess if pt is able to functionally participate in skilled OT services and progress towards goals*    Patient will benefit from skilled occupational therapy intervention to address the above impairments.    Patient's rehabilitation potential is considered to be Fair for below stated goals.     Recommendations:  Recommend continued occupational therapy during acute stay.   Recommend out of bed activity to counteract ill effects of bedrest, with assistance from staff as needed.    Discharge Recommendations:   -  Skilled nursing facility (SNF):  Would benefit to improve independence in ADLs, strength, activity tolerance and balance to ensure a successful and sustainable return to prior level of function.    -  Patient does not want to go to rehab facility. Patient prefers to return home with family support and  resume HHOT. Patient will continue to benefit from skilled OT services ([place])  to increase overall independence in ADLs, strength, activity tolerance and balance and safety awareness.  Recommend overall need for supervision when returning home for optimized safety    FUNCTIONAL ASSESSMENT  AM-PAC Inpatient Daily Activity Raw Score: 8    -  SNF:  Current research shows that an AM-PAC score of 17 or less is not associated with a discharge to the patient's home setting.  Based on an AM-PAC score and their current ADL deficits; it is recommended that the patient have 3-5 sessions per week of Occupational Therapy at d/c to increase the patient's independence.     This AMPAC score should be considered in conjunction with interdisciplinary team recommendations to  determine the most appropriate discharge setting. Patient's social support, diagnosis, medical stability, and prior level of function should also be taken into consideration.    Equipment Recommendations for Discharge:   -  no needs anticipated; has DME available at home     [place] was obtained by: patient is questionable historian, chart review  Home environment: Patient lives with spouse in a 2 story home with patient's bedroom on 2nd floor.  Prior level of function: Patient requires assistance with basic ADLs.  Patient is bed bound. Reports she was able to complete self feeding and grooming. States she was stand pivot <> BSC for toileting (chart says pt performs at bed level). Spouse, PCA completes/ assists with all other ADL/ IADLs  Prior level of Instrumental Activities of Daily Living: Patient does not perform any IADLs.  Home equipment: Walker, rolling;Wheelchair-manual;Hospital bed;Oxygen, (hoyer lift, nebulizer o2 at 3.5 l)    PLAN      Patient will be followed by occupational therapy to address goals while hospitalized as patient's status and schedule permit.   Patient to be seen 3-5 days x 1 week for Adaptive equipment, ADI training, activity tolerance, functional balance training, functional mobility training, therapeutic exercise, therapeutic activity, patient/caregiver education and training, home exercise program, energy conservation, manual therapy, and modalities as needed     Patient and/or family have participated as able in goal setting and plan of care.    *will participate in 1 week trial to assess if pt is able to functionally participate in skilled OT services and progress towards goals*    GOALS     OT goals initiated 02/21/2022 and will be met by patient within 5 sessions     -  Patient will feed self with right hand for 75% of each meal.   -  Patient will be maximum assistance with move from supine to sit and sit to supine  in preparation for OOB ADLs.  -   Patient will demonstrate good safety awareness during bed mobility and self care tasks to reduce fall risks.  -  Patient will tolerate sitting EOB for 8 minutes with maximum assistance to promote maximal independence in ADLs,  -  Patient will tolerate sitting up in chair for 1 - 2 hours/day to improve/maintain respiratory capacity and endurance for carry over to functional tasks.   -  Patient will be educated and demonstrate understanding/follow through with BUE HEP to increase strength and ROM to optimize success with self care tasks and transfers.     -  Patient will attend to task 15 minutes during ADLs with maximum assistance and multimodal cues.  -Patient will complete grooming tasks at bed level with Mod A to optimize (I) and decrease caregiver burden upon d/c.  EDUCATION/COMMUNICATION     Barriers to learning/limitations: Yes;  cognitive, physical, needed increased assistance at baseline, decreased motivation/ initiation     Education provided OZ:HYQMVHQ on (+) role of OT, (+) OT plan of care, (+) instructed patient on the importance of activity while hospitalized to prevent a decline in function, (+) staff assistance with mobility, (+) change positions frequently, (+) ADL training, (+) safety, (+) energy conservation techniques    Educational handouts issued: none this session    Patient / family response to education: asking questions, no evidence of learning, needs reinforcement,    Opportunity for questions and clarification was provided.    SUBJECTIVE     Patient " I dont want to" when asked if to try to sit at EOB w/ assist of 2 therapist.   Also stated " I hate them" when asked if she would be willing to don offloading boots to promote skin integrity    Pain Assessment: no pain at rest, but c/o significant / 10 pain with movement. location: LLE    OBJECTIVE DATA SUMMARY     Orders, labs, and chart reviewed on [facility]. Communicated with nursing staff. Patient cleared to participate in  Occupational Therapy evaluation.    Patient was admitted to the hospital on 02/13/2022 with   Chief Complaint   Patient presents with    Respiratory Distress     Present illness history:   Patient Active Problem List    Diagnosis Date Noted    Acute respiratory failure with hypoxia and hypercapnia ([place]) 02/14/2022    Respiratory failure ([place]) 05/11/2021    Acute CHF (congestive heart failure) ([place]) 01/24/2021    UTI (urinary tract infection) 06/01/2019    COPD exacerbation ([place]) 05/07/2019    Acute hypoxemic respiratory failure ([place]) 05/07/2019    Chronic respiratory failure with hypoxia (HCC) 08/22/2018    Headache 08/05/2018    Abdominal pain 02/04/2018    SIRS (systemic inflammatory response syndrome) ([place]) 10/09/2017    Obtunded 09/08/2017    Hyperkalemia 09/08/2017    Septic shock ([place]) 46/96/2952    Metabolic encephalopathy 84/13/2440    Acute renal failure (ARF) ([place]) 09/08/2017    Acute-on-chronic kidney injury ([place]) 08/07/2017    COPD with acute exacerbation ([place]) 08/07/2017    Dehydration 08/07/2017    Dyspnea 06/15/2017    Chest pain 06/15/2017    Type 2 diabetes mellitus with diabetic neuropathy ([place]) 12/07/2016    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    Cannabinoid hyperemesis syndrome 08/17/2016    Gastritis 08/16/2016    Nausea & vomiting 08/16/2016    Asthma with acute exacerbation 08/04/2016    Leukocytosis 07/24/2016    Sepsis ([place]) 07/24/2016    Lactic acidosis 07/24/2016    Asthma exacerbation 07/24/2016    Type 2 diabetes mellitus with nephropathy ([place]) 06/12/2016    Sacroiliitis ([place]) 11/25/2015    Lumbar and sacral osteoarthritis 09/15/2015    Chronic pain syndrome 09/15/2015    Spondylosis of lumbar region without myelopathy or radiculopathy 09/15/2015    Uncontrolled type 2 diabetes mellitus with hyperglycemia ([place]) 08/20/2015    Acute hyperglycemia 07/02/2015    Acute colitis 07/02/2015    Accelerated hypertension 04/08/2015    Severe headache 04/07/2015    Osteoarthritis of hips,  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    PTSD (post-traumatic stress disorder) 01/29/2015    Severe hypertension 07/21/2014      Previous medical history:   Past Medical History:   Diagnosis Date    Arthritis  Chronic pain syndrome     related to R hip replacement    COPD (chronic obstructive pulmonary disease) (HCC)     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    DM2 (diabetes mellitus, type 2) (HCC)     GERD (gastroesophageal reflux disease)     Hepatitis C     HEP C    HTN (hypertension)     Lung nodule, multiple     (CT 10/2016) Small left upper lobe and 1.7 x 1.6 cm left lower lobe nodules not significantly changed from 07/23/2016 and 03/22/2016    Marijuana use     PTSD (post-traumatic stress disorder)     lived through the [place] in 1993    Thoracic ascending aortic aneurysm     4.4cm noted on CT Chest (10/2016)    Thyroid nodule     2.5 cm stable right thyroid nodule (CT 10/2016)       PATIENT FOUND     Bed, (+) bed/chair exit alarm, (+) oxygen, (+) ICU/step down equipment, (+) pure wick urine system    Treatment included: energy conservation, ADI retraining, activity tolerance, education for positioning and/or educational instruction during/immediately following OT evaluation    COGNITIVE STATUS     Mental status:   oriented to self and year  Communication: soft spoken, limited verbalization  Attention Span:  poor (0-40mn), required encouragement to attend and/or stay on task  General Cognition:   impaired  Follows commands: follows 1 step commands with prompting  Safety/Judgement: impaired  Hearing:   grossly intact  Vision:   grossly intact, Corrective Lenses: reading glasses    UPPER EXTREMITY ASSESSMENT     Dominance:right  RIGHT:  ACTIVE ASSIST range of motion is 50% ROM Strength is grossly graded as  2+/5: (Able to initiate movement against gravity)   LEFT:   ACTIVE ASSIST range of motion is 50% ROM  Strength is grossly graded as 2+/5: (Able to initiate movement against gravity), 2-/5: (Less than full range  of motion in gravity eliminated position)       Comment(s):   - pt resisting MMT, requiring encouragement to participate     ACTIVITIES OF DAILY LIVING     Based on direct observation, simulation and clinical assessment.  (clincial judgement based on: activity tolerance, balance, safety awareness, cognition, functional strength/ROM/coordination of all extremities)    Eating:           - supervision, set-up, to briefly grasp and drink from beverage when cup was placed in pts hands.  Grooming:     - maximum assistance, washing face/hand,   Patient was using right upper extremity.  UB bathing:   - total assistance  LB bathing:   -  total assistance  UB dressing: - total assistance  LB dressing: - total assistance  Toileting:       - total assistance    Comment(s):   -  Patient requires set - up for ADLs.  -  Patient performed ADLs at bed level.  -  Patient required increased time to perform grooming.    FUNCTIONAL MOBILITY STATUS     Mobility:  Repositioning > L to adjust wedges/ pillows; DEP. Pt not attempting to assist with tasks    Declining to sit at EOB despite encouragement.      Transfers:  Patient is total assistance in all areas.      BALANCE AND ACTIVITY TOLERANCE     Static Sitting Balance -  not tested  Dynamic Sitting Balance -      not tested  Static Standing Balance -       not tested  Dynamic Standing Balance -  not tested    Activity Tolerance:   - tolerance to sustained activity limited by fatigue  - functional activity tolerance limited by deconditioning  - requires increased time  - requires oxygen  - severely deconditioned    Comment(s):   -  Opportunity for questions and clarification was provided as needed.    FINAL LOCATION     Patient positioned in bed, all needs within reach, agrees to call for assistance, (+) bed/chair exit alarm, (+) bilateral LEs elevated, repositioned with pillows for comfort. Nursing notified    Thank you for this referral.    Georgeanna Lea, OTR/L  February 21, 2022

## 2022-02-21 NOTE — Progress Notes (Signed)
SPEECH- LANGUAGE PATHOLOGY NOTE   Patient: [name] [name] (71 y.o. female) 04-05-1951  Room: 5310/5310  Primary Diagnosis: Acute hyperkalemia [E87.5]  Hyperglycemia [R73.9]  Septic shock (HCC) [A41.9, R65.21]  Acute respiratory failure with hypoxia and hypercapnia (HCC) [J96.01, J96.02]  Community acquired pneumonia of left lung, unspecified part of lung [J18.9]          Date: 02/21/2022      Attempted dysphagia treatment, unable to complete 2* pt deferred due to just eating and not wanting to eat anymore.  SLP will continue to follow per POC as appropriate.     Thank you,  Sandford Craze MA-CCC-SLP  Speech Pathologist   Office [phone]

## 2022-02-21 NOTE — Plan of Care (Signed)
Problem: Respiratory - Adult  Goal: Achieves optimal ventilation and oxygenation  Outcome: Progressing
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
## 2022-02-22 LAB — COMPREHENSIVE METABOLIC PANEL
ALT: 11 U/L (ref 10–49)
AST: 10 U/L (ref 0.0–33.9)
Albumin: 3.3 gm/dl — ABNORMAL LOW (ref 3.4–5.0)
Alkaline Phosphatase: 65 U/L (ref 46–116)
Anion Gap: 3 mmol/L — ABNORMAL LOW (ref 5–15)
BUN: 18 mg/dl (ref 9–23)
CO2: 36 mEq/L — ABNORMAL HIGH (ref 20–31)
Calcium: 9.3 mg/dl (ref 8.7–10.4)
Chloride: 103 mEq/L (ref 98–107)
Creatinine: 0.54 mg/dl — ABNORMAL LOW (ref 0.55–1.02)
GFR African American: >60
GFR Non-African American: >60
Glucose: 153 mg/dl — ABNORMAL HIGH (ref 74–106)
Potassium: 4.8 mEq/L (ref 3.5–5.1)
Sodium: 142 mEq/L (ref 136–145)
Total Bilirubin: 0.4 mg/dl (ref 0.30–1.20)
Total Protein: 6.5 gm/dl (ref 5.7–8.2)

## 2022-02-22 LAB — CBC WITH AUTO DIFFERENTIAL
Basophils: 0.2 % (ref 0–3)
Eosinophils: 1.6 % (ref 0–5)
Hematocrit: 34.7 % — ABNORMAL LOW (ref 35.0–47.0)
Hemoglobin: 10 gm/dl — ABNORMAL LOW (ref 11.0–16.0)
Immature Granulocytes: 0.3 % (ref 0.0–3.0)
Lymphocytes: 10.1 % — ABNORMAL LOW (ref 28–48)
MCH: 26.4 pg (ref 25.4–34.6)
MCHC: 28.8 gm/dl — ABNORMAL LOW (ref 30.0–36.0)
MCV: 91.6 fL (ref 80.0–98.0)
MPV: 12 fL — ABNORMAL HIGH (ref 6.0–10.0)
Monocytes: 7.9 % (ref 1–13)
Neutrophils Segmented: 79.9 % — ABNORMAL HIGH (ref 34–64)
Nucleated RBCs: 0 (ref 0–0)
Platelets: 146 10*3/uL (ref 140–450)
RBC: 3.79 M/uL (ref 3.60–5.20)
RDW: 63.1 — ABNORMAL HIGH (ref 36.4–46.3)
WBC: 17.4 10*3/uL — ABNORMAL HIGH (ref 4.0–11.0)

## 2022-02-22 LAB — POCT GLUCOSE
POC Glucose: 270 mg/dL — ABNORMAL HIGH (ref 65–105)
POC Glucose: 177 mg/dL — ABNORMAL HIGH (ref 65–105)
POC Glucose: 286 mg/dL — ABNORMAL HIGH (ref 65–105)
POC Glucose: 182 mg/dL — ABNORMAL HIGH (ref 65–105)

## 2022-02-22 LAB — CULTURE, BLOOD 2: BLOOD CULTURE RESULT: NO GROWTH

## 2022-02-22 MED FILL — PIPERACILLIN SOD-TAZOBACTAM SO 3.375 (3-0.375) G IV SOLR: 3.375 (3-0.375) g | INTRAVENOUS | Qty: 3375

## 2022-02-22 MED FILL — GUAIFENESIN 100 MG/5ML PO LIQD: 100 MG/5ML | ORAL | Qty: 20

## 2022-02-22 MED FILL — PANTOPRAZOLE SODIUM 40 MG PO TBEC: 40 MG | ORAL | Qty: 1

## 2022-02-22 MED FILL — FLUCONAZOLE IN SODIUM CHLORIDE 400-0.9 MG/200ML-% IV SOLN: INTRAVENOUS | Qty: 200

## 2022-02-22 MED FILL — SODIUM CHLORIDE FLUSH 0.9 % IV SOLN: 0.9 % | INTRAVENOUS | Qty: 40

## 2022-02-22 MED FILL — ALBUTEROL SULFATE (2.5 MG/3ML) 0.083% IN NEBU: RESPIRATORY_TRACT | Qty: 3

## 2022-02-22 MED FILL — ELIQUIS 5 MG PO TABS: 5 MG | ORAL | Qty: 1

## 2022-02-22 MED FILL — ROSUVASTATIN CALCIUM 5 MG PO TABS: 5 MG | ORAL | Qty: 1

## 2022-02-22 MED FILL — QUETIAPINE FUMARATE 25 MG PO TABS: 25 MG | ORAL | Qty: 1

## 2022-02-22 MED FILL — LANTUS 100 UNIT/ML SC SOLN: 100 UNIT/ML | SUBCUTANEOUS | Qty: 1

## 2022-02-22 MED FILL — SPIRONOLACTONE 25 MG PO TABS: 25 MG | ORAL | Qty: 1

## 2022-02-22 MED FILL — HUMALOG 100 UNIT/ML IJ SOLN: 100 UNIT/ML | INTRAMUSCULAR | Qty: 1

## 2022-02-22 MED FILL — AMLODIPINE BESYLATE 5 MG PO TABS: 5 MG | ORAL | Qty: 2

## 2022-02-22 MED FILL — MONTELUKAST SODIUM 10 MG PO TABS: 10 MG | ORAL | Qty: 1

## 2022-02-22 MED FILL — MAGNESIUM OXIDE -MG SUPPLEMENT 400 (240 MG) MG PO TABS: 400 (240 Mg) MG | ORAL | Qty: 1

## 2022-02-22 MED FILL — ACETYLCYSTEINE 10 % IN SOLN: 10 % | RESPIRATORY_TRACT | Qty: 4

## 2022-02-22 MED FILL — METOPROLOL SUCCINATE ER 50 MG PO TB24: 50 MG | ORAL | Qty: 1

## 2022-02-22 MED FILL — LOSARTAN POTASSIUM 50 MG PO TABS: 50 MG | ORAL | Qty: 2

## 2022-02-22 NOTE — Progress Notes (Signed)
Progress Note    Patient: [name] [name]   Age:  71 y.o.  DOA: 02/13/2022   Admit Dx / CC:   LOS:  LOS: 8 days     Active Problems   1.  Septic shock likely due to pneumonia  2.  Acute hypoxic and hypercapnic respiratory failure  3.  Hyperkalemia  4.  COPD with acute exacerbation  5.  DM type II controlled   6.  History of DVT  7.  Hypertension  8.  Macrocytic anemia likely iron deficiency  9.  Hypokalemia  10.  Hypomagnesemia  11.  Hypophosphatemia  Plan:   1.  Patient was seen and examined this morning.  Feeling little better  We will keep titrating oxygen to least amount of oxygen she needs  Goal is to keep oxygen saturation below 92%.  Patient was strongly encouraged incentive spirometry every 2 hourly  2.  For glycemic control patient will be continued on Glucomander protocol  3.  Case manager consult was done.  Patient is now stable to be discharged to skilled nursing facility.  4.  CBC and chemistry will be repeated in a.m.  Subjective:   Feeling little better today  Objective:   Visit Vitals  BP (!) 158/99   Pulse 90   Temp 97.5 F (36.4 C) (Temporal)   Resp 30   Ht 5\' 7"  (1.702 m)   Wt 215 lb 6.2 oz (97.7 kg)   SpO2 96%   BMI 33.73 kg/m       Physical Exam:  General appearance: Elderly female well-built    Laying comfortably no acute distress  Lungs: Decreased breath sounds left lung base  Heart: Regular rhythm, no murmur,   Abdomen: soft, non-tender. Bowel sounds Audible,  Extremities: Trace edema, distal pulses palpable  Neurologic: Awake and alert  Answering questions appropriately  Able to move all 4 extremities  Intake and Output:  Current Shift:  No intake/output data recorded.  Last three shifts:  09/11 0701 - 09/12 1900  In: 60 [P.O.:60]  Out: 2200 [Urine:2200]    Lab/Data Reviewed:  Recent Results (from the past 48 hour(s))   POCT Glucose    Collection Time: 02/20/22  9:55 PM   Result Value Ref Range    POC Glucose 213 (H) 65 - 105 mg/dL   POCT Glucose    Collection Time: 02/21/22 12:05 AM   Result  Value Ref Range    POC Glucose 179 (H) 65 - 105 mg/dL   CBC with Auto Differential    Collection Time: 02/21/22  2:04 AM   Result Value Ref Range    WBC 20.8 (H) 4.0 - 11.0 1000/mm3    RBC 3.84 3.60 - 5.20 M/uL    Hemoglobin 10.2 (L) 11.0 - 16.0 gm/dl    Hematocrit 04/23/22 (L) 35.0 - 47.0 %    MCV 87.5 80.0 - 98.0 fL    MCH 26.6 25.4 - 34.6 pg    MCHC 30.4 30.0 - 36.0 gm/dl    Platelets 74.0 814 - 450 1000/mm3    MPV 11.7 (H) 6.0 - 10.0 fL    RDW 59.9 (H) 36.4 - 46.3      Nucleated RBCs 0 0 - 0      Immature Granulocytes 0.3 0.0 - 3.0 %    Neutrophils Segmented 82.5 (H) 34 - 64 %    Lymphocytes 7.8 (L) 28 - 48 %    Monocytes 8.2 1 - 13 %    Eosinophils 1.0  0 - 5 %    Basophils 0.2 0 - 3 %   Comprehensive Metabolic Panel    Collection Time: 02/21/22  2:04 AM   Result Value Ref Range    Potassium 4.4 3.5 - 5.1 mEq/L    Chloride 104 98 - 107 mEq/L    Sodium 141 136 - 145 mEq/L    CO2 32 (H) 20 - 31 mEq/L    Glucose 161 (H) 74 - 106 mg/dl    BUN 18 9 - 23 mg/dl    Creatinine 8.75 6.43 - 1.02 mg/dl    GFR African American >60.0      GFR Non-African American >60      Calcium 9.2 8.7 - 10.4 mg/dl    Anion Gap 5 5 - 15 mmol/L    AST 9.0 0.0 - 33.9 U/L    ALT 10 10 - 49 U/L    Alkaline Phosphatase 62 46 - 116 U/L    Total Bilirubin 0.50 0.30 - 1.20 mg/dl    Total Protein 6.3 5.7 - 8.2 gm/dl    Albumin 3.2 (L) 3.4 - 5.0 gm/dl   Magnesium    Collection Time: 02/21/22  2:04 AM   Result Value Ref Range    Magnesium 1.7 1.6 - 2.6 mg/dL   Phosphorus    Collection Time: 02/21/22  2:04 AM   Result Value Ref Range    Phosphorus 3.8 2.4 - 5.1 mg/dL   POCT Glucose    Collection Time: 02/21/22  8:37 AM   Result Value Ref Range    POC Glucose 195 (H) 65 - 105 mg/dL   POCT Glucose    Collection Time: 02/21/22 11:53 AM   Result Value Ref Range    POC Glucose 289 (H) 65 - 105 mg/dL   POCT Glucose    Collection Time: 02/21/22  4:45 PM   Result Value Ref Range    POC Glucose 248 (H) 65 - 105 mg/dL   POCT Glucose    Collection Time: 02/21/22  8:40  PM   Result Value Ref Range    POC Glucose 270 (H) 65 - 105 mg/dL   CBC with Auto Differential    Collection Time: 02/22/22  2:51 AM   Result Value Ref Range    WBC 17.4 (H) 4.0 - 11.0 1000/mm3    RBC 3.79 3.60 - 5.20 M/uL    Hemoglobin 10.0 (L) 11.0 - 16.0 gm/dl    Hematocrit 32.9 (L) 35.0 - 47.0 %    MCV 91.6 80.0 - 98.0 fL    MCH 26.4 25.4 - 34.6 pg    MCHC 28.8 (L) 30.0 - 36.0 gm/dl    Platelets 518 841 - 450 1000/mm3    MPV 12.0 (H) 6.0 - 10.0 fL    RDW 63.1 (H) 36.4 - 46.3      Nucleated RBCs 0 0 - 0      Immature Granulocytes 0.3 0.0 - 3.0 %    Neutrophils Segmented 79.9 (H) 34 - 64 %    Lymphocytes 10.1 (L) 28 - 48 %    Monocytes 7.9 1 - 13 %    Eosinophils 1.6 0 - 5 %    Basophils 0.2 0 - 3 %   Comprehensive Metabolic Panel    Collection Time: 02/22/22  2:51 AM   Result Value Ref Range    Potassium 4.8 3.5 - 5.1 mEq/L    Chloride 103 98 - 107 mEq/L    Sodium 142 136 - 145  mEq/L    CO2 36 (H) 20 - 31 mEq/L    Glucose 153 (H) 74 - 106 mg/dl    BUN 18 9 - 23 mg/dl    Creatinine 1.61 (L) 0.55 - 1.02 mg/dl    GFR African American >60.0      GFR Non-African American >60      Calcium 9.3 8.7 - 10.4 mg/dl    Anion Gap 3 (L) 5 - 15 mmol/L    AST 10.0 0.0 - 33.9 U/L    ALT 11 10 - 49 U/L    Alkaline Phosphatase 65 46 - 116 U/L    Total Bilirubin 0.40 0.30 - 1.20 mg/dl    Total Protein 6.5 5.7 - 8.2 gm/dl    Albumin 3.3 (L) 3.4 - 5.0 gm/dl   POCT Glucose    Collection Time: 02/22/22  7:44 AM   Result Value Ref Range    POC Glucose 177 (H) 65 - 105 mg/dL   POCT Glucose    Collection Time: 02/22/22 11:25 AM   Result Value Ref Range    POC Glucose 286 (H) 65 - 105 mg/dL   POCT Glucose    Collection Time: 02/22/22  4:33 PM   Result Value Ref Range    POC Glucose 182 (H) 65 - 105 mg/dL       Imaging:  [email or web address] @    Medications Reviewed:  Current Facility-Administered Medications   Medication Dose Route Frequency    acetylcysteine (MUCOMYST) 10 % solution 400 mg  4 mL Inhalation Q6H    albuterol (PROVENTIL) (2.5 MG/3ML)  0.083% nebulizer solution 2.5 mg  2.5 mg Nebulization Q6H    guaiFENesin (ROBITUSSIN) 100 MG/5ML liquid 400 mg  400 mg Oral TID    LORazepam (ATIVAN) injection 0.5 mg  0.5 mg IntraVENous Q6H PRN    metoprolol succinate (TOPROL XL) extended release tablet 50 mg  50 mg Oral Daily    pantoprazole (PROTONIX) tablet 40 mg  40 mg Oral QAM AC    spironolactone (ALDACTONE) tablet 25 mg  25 mg Oral Daily    potassium chloride 20 mEq/50 mL IVPB (Central Line)  20 mEq IntraVENous PRN    Or    potassium chloride 10 mEq/100 mL IVPB (Peripheral Line)  10 mEq IntraVENous PRN    magnesium sulfate 2000 mg in 50 mL IVPB premix  2,000 mg IntraVENous PRN    magnesium oxide (MAG-OX) tablet 400 mg  400 mg Oral BID    potassium chloride 20 mEq in 100 mL IVPB  20 mEq IntraVENous Once    apixaban (ELIQUIS) tablet 5 mg  5 mg Oral BID    amLODIPine (NORVASC) tablet 10 mg  10 mg Oral Daily    losartan (COZAAR) tablet 100 mg  100 mg Oral Daily    sodium hypochlorite (DAKINS) 0.125 % external solution   Topical Daily    fluconazole (DIFLUCAN) in 0.9 % sodium chloride IVPB 400 mg  400 mg IntraVENous Q24H    silver sulfADIAZINE (SILVADENE) 1 % cream   Topical BID    erythromycin (ROMYCIN) ophthalmic ointment   Both Eyes 3 times per day    prochlorperazine (COMPAZINE) injection 10 mg  10 mg IntraVENous Q6H PRN    insulin lispro (HUMALOG) injection vial 1-100 Units  1-100 Units SubCUTAneous 4x Daily AC & HS    QUEtiapine (SEROQUEL) tablet 25 mg  25 mg Oral Nightly    sodium chloride flush 0.9 % injection 5-40 mL  5-40 mL IntraVENous BID  fluticasone furoate-vilanterol (BREO ELLIPTA) 100-25 MCG/ACT inhaler 1 puff  1 puff Inhalation Daily    montelukast (SINGULAIR) tablet 10 mg  10 mg Oral Daily    rosuvastatin (CRESTOR) tablet 5 mg  5 mg Oral Nightly    piperacillin-tazobactam (ZOSYN) 3,375 mg in sodium chloride 0.9 % 100 mL IVPB (mini-bag)  3,375 mg IntraVENous Q8H    insulin glargine (LANTUS) injection vial 1-100 Units  1-100 Units SubCUTAneous  QHS    sodium chloride flush 0.9 % injection 5-40 mL  5-40 mL IntraVENous PRN    0.9 % sodium chloride infusion   IntraVENous PRN    ondansetron (ZOFRAN-ODT) disintegrating tablet 4 mg  4 mg Oral Q8H PRN    Or    ondansetron (ZOFRAN) injection 4 mg  4 mg IntraVENous Q6H PRN    polyethylene glycol (GLYCOLAX) packet 17 g  17 g Oral Daily PRN    acetaminophen (TYLENOL) tablet 650 mg  650 mg Oral Q6H PRN    Or    acetaminophen (TYLENOL) suppository 650 mg  650 mg Rectal Q6H PRN    glucagon (rDNA) injection 1 mg  1 mg IntraMUSCular PRN    dextrose 50 % IV solution  20-30 mL IntraVENous PRN         Dragon medical dictation software was used for portions of this report. Unintended errors may occur.   [name], MD  P# [id number]  February 22, 2022

## 2022-02-22 NOTE — Progress Notes (Signed)
OCCUPATIONAL THERAPY TREATMENT    Time  OT Charge Capture  Rehab Caseload Tracker  -    Patient: [name] [name] (71 y.o. female)  Room: 5310/5310    Primary Diagnosis: Acute hyperkalemia [E87.5]  Hyperglycemia [R73.9]  Septic shock (HCC) [A41.9, R65.21]  Acute respiratory failure with hypoxia and hypercapnia (HCC) [J96.01, J96.02]  Community acquired pneumonia of left lung, unspecified part of lung [J18.9]       Date of Admission: 02/13/2022   Length of Stay:  8 day(s)  Insurance: Payor: [employer] MEDICARE / Plan: [employer] GOLD PLUS HMO / Product Type: *No Product type* /      Date: 02/22/2022  Time In: 1357        Time Out: 1420       Total Minutes: 23       Isolation:  Contact       MDRO: MRSA    Precautions: falls, pressure injury risk, decreased cognition   Ordered weight bearing status: no weight bearing restrictions    Current diet order: ADULT DIET; Dysphagia - Minced and Moist  ADULT ORAL NUTRITION SUPPLEMENT; Breakfast, Dinner, Lunch; Standard 4 oz Oral Supplement    ASSESSMENT     Based on the objective data described below, the patient presents with       - participated in AAROM BUE bed level  - needed cues to keep eyes open during session  - decrease tolerance to sustained activity  - impaired functional strength  - impaired functional mobility  - easily fatigued   - decrease AROM in B shoulders  - spouse at bedside reports patient has been experiencing consistent functional decline in the past year spending most of time in bed    Recommendations:  Recommend continued skilled occupational therapy intervention to address above impairments.  Recommend out of bed activity to counteract ill effects of bedrest, with assistance from staff as needed.    Discharge Recommendations:   -  Skilled nursing facility (SNF):  Would benefit to improve independence in ADLs, strength, activity tolerance and balance to ensure a successful and sustainable return to prior level of function.    Equipment Recommendations for  Discharge:   -  no needs anticipated; has DME available at home     PLAN     3-5 days x 1 week for trial  Continue established OT POC  Patient will continue to benefit from skilled OT services to attain remaining goals    COMMUNICATION/EDUCATION     Barriers to learning/limitations: Yes;  difficulty processing new information, decreased cognition, delayed responses, physical    Education provided [id number] and spouse on (+) role of OT, (+) OT plan of care, (+) Instructed patient in the benefits of maintaining activity tolerance, functional mobility, and independence with self care tasks during acute stay  to ensure safe return home and to baseline. Encouraged patient to increase frequency and duration OOB, be out of bed for all meals, perform daily ADLs (as approved by RN/MD regarding bathing etc), and performing functional mobility to/from bathroom with staff assistance as needed., (+) instructed patient on the importance of activity while hospitalized to prevent a decline in function, (+) the importance of maintaining UE muscle strength and activity tolerance while hospitalized to prevent a decline in function, (+) staff assistance with mobility, (+) change positions frequently, (+) discharge disposition/recommendations, (+) ADL training, (+) safety, (+) energy conservation techniques    Educational handouts issued: none this session    Patient / family response to education: showing  interest, needs reinforcement    SUBJECTIVE     Patient "I'm ok".    Pain assessment: none observed    OBJECTIVE DATA SUMMARY     Orders, labs, occupational therapy and chart reviewed on [name] [name]. Communicated with nursing staff. Patient cleared to participate in Occupational Therapy treatment.    PATIENT FOUND     Bed, (+) bed/chair exit alarm, (+) visitor: spouse, (+) telemetry, (+) oxygen, (+) IV, (+) ICU/step down equipment, (+) SPO2 monitor    COGNITIVE STATUS     Mental status:   oriented to self and year  Communication:  soft spoken, limited verbalization  Attention Span:  poor (0-36min), required encouragement to attend and/or stay on task  General Cognition:   impaired  Follows commands: follows 1 step commands with prompting  Safety/Judgement: impaired  Hearing:   grossly intact  Vision:   grossly intact, Corrective Lenses: reading glasses    ACTIVITES OF DAILY LIVING     Addressed therapeutic exercises, therapeutic activities in prep for participation in ADLs    MOBILITY     Not addressed this session    Transfers:  Not addressed this session    BALANCE AND ACTIVITY TOLERANCE     Balance:  -  Not addressed this session    Activity Tolerance:   - vitals stable  - tolerance to sustained activity limited by fatigue  - functional activity tolerance limited by deconditioning    Vitals:   Vitals stable throughout session    THERAPEUTIC EXERCISE/ACTIVITY     Therapeutic Exercises:   -  Completed gentle stretch to bilateral shoulder flexion.  patient semireclined in bed.  Exercises performed: bed and HOB elevated  -  SCAPULA: bilateral, AAROM exercises: 5 reps  of elevation, depression, protraction/retraction   -  SHOULDER: bilateral, AAROM exercises: 5 reps  of flexion/ extension and abduction/ adduction  -  ELBOW:  bilateral, AROM exercises: 5 reps  of flexion/extension  -  FOREARM: bilateral, AAROM exercises: 5 reps  of supination/pronation  -  HAND/DIGITS:  bilateral, AROM exercises:  5 reps  of gross flexion/extension    Therapeutic Activity:  Educated patient and family on role of OT and importance of patient/family participation in plan of care, progress toward goals, and patient/family role in recovery.      FINAL LOCATION     Patient positioned in bed, all needs within reach, agrees to call for assistance, (+) bed/chair exit alarm, nursing staff notified, (+) spouse present    Jerene Canny, OTA  February 22, 2022
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
## 2022-02-22 NOTE — Progress Notes (Signed)
BiPAP Progress Note    Patient currently is Off BiPAP  Patient wore intermittently BiPAP for the PM shift    Pt wore BIPAP x4hrs. No pt distress noted at this time.    BiPAP settings are:  Serial #[id number]  IPAP:  18 cmH20   EPAP: 8 cmH2O  Rate:   12  FiO2:   35 %

## 2022-02-22 NOTE — Care Coordination-Inpatient (Signed)
02/22/22, 12:04 PM EDT    DISCHARGE PLANNING ON GOING EVALUATION    [facility] day: 8  Location: 5310/5310 Reason for admit: Acute hyperkalemia [E87.5]  Hyperglycemia [R73.9]  Septic shock (HCC) [A41.9, R65.21]  Acute respiratory failure with hypoxia and hypercapnia (HCC) [J96.01, J96.02]  Community acquired pneumonia of left lung, unspecified part of lung [J18.9]   Barriers to Discharge:   PCP: [name], PA-C  Readmission Risk Score: 15%  Patient Goals/Plan/Treatment Preferences:PT recommending SNF. Notes loaded in [place] and matched out to all facilities within 15 mile radius of hospital. Called patients son [name] [name] to get [facility] for SNF. Son reports he is driving right now and will talk to the CM when he arrives at the hospital.

## 2022-02-22 NOTE — ACP (Advance Care Planning) (Signed)
[place] Advance Directive dated 12/07/15 on file.  MPOA named as spouse [name] [name] [phone].     Health Care Instructions:    In the event my attending physician determines that my death is imminent... I want all treatments to prolong my life as long as possible within the limits of generally accepted health care standards.    If my condition makes me unaware of myself or my surroundings or unable to interact with others.... I want to try treatments for a period of time in the hope of some improvement.... 2 weeks.....      Kristian Covey, RN, [facility]  Palliative Medicine  [phone]

## 2022-02-22 NOTE — Progress Notes (Addendum)
Brief Palliative Care RN Note:    Visited Mrs [name] [name].  Care partners at bedside providing care.  She was able to eat some of lunch per CP at bedside.    No visitors present.    Called spouse [name] [name] at both contact numbers in ACP section of EMR.  V/M left at each.    Palliative Care continuing to follow.      1245-Spouse at bedside and he provided updated Advance Directive for Mrs [name] [name].  Mr [name] [name] confirms previously established goals of care for patient and that Mrs [name] [name] would wish for blood/blood product transfusions if ever necessary.  Updated this in EMR and updated Dr Leodis Binet and Dr Tasia Catchings via secure chat.    Up to date AD placed in chart and pending scan into Epic.  ACP note to follow.    Kristian Covey, RN, [facility]  Palliative Medicine  [phone]

## 2022-02-22 NOTE — Plan of Care (Signed)
Problem: Respiratory - Adult  Goal: Achieves optimal ventilation and oxygenation  Outcome: Progressing

## 2022-02-23 LAB — COMPREHENSIVE METABOLIC PANEL
ALT: 9 U/L — ABNORMAL LOW (ref 10–49)
AST: 10 U/L (ref 0.0–33.9)
Albumin: 3 gm/dl — ABNORMAL LOW (ref 3.4–5.0)
Alkaline Phosphatase: 59 U/L (ref 46–116)
Anion Gap: 4 mmol/L — ABNORMAL LOW (ref 5–15)
BUN: 14 mg/dl (ref 9–23)
CO2: 37 mEq/L — ABNORMAL HIGH (ref 20–31)
Calcium: 9.1 mg/dl (ref 8.7–10.4)
Chloride: 101 mEq/L (ref 98–107)
Creatinine: 0.5 mg/dl — ABNORMAL LOW (ref 0.55–1.02)
GFR African American: >60
GFR Non-African American: >60
Glucose: 128 mg/dl — ABNORMAL HIGH (ref 74–106)
Potassium: 4.7 mEq/L (ref 3.5–5.1)
Sodium: 142 mEq/L (ref 136–145)
Total Bilirubin: 0.3 mg/dl (ref 0.30–1.20)
Total Protein: 5.9 gm/dl (ref 5.7–8.2)

## 2022-02-23 LAB — CBC WITH AUTO DIFFERENTIAL
Anisocytosis: 2
Hematocrit: 32.9 % — ABNORMAL LOW (ref 35.0–47.0)
Hemoglobin: 9.8 gm/dl — ABNORMAL LOW (ref 11.0–16.0)
Lymphocytes: 12 % — ABNORMAL LOW (ref 28–48)
MCH: 27.2 pg (ref 25.4–34.6)
MCHC: 29.8 gm/dl — ABNORMAL LOW (ref 30.0–36.0)
MCV: 91.4 fL (ref 80.0–98.0)
MPV: 12.3 fL — ABNORMAL HIGH (ref 6.0–10.0)
Macrocytes: 2
Monocytes: 16 % — ABNORMAL HIGH (ref 1–13)
Neutrophils Segmented: 72 % — ABNORMAL HIGH (ref 34–64)
Nucleated RBCs: 0 (ref 0–0)
Platelet Appearance: DECREASED
Platelets: 122 10*3/uL — ABNORMAL LOW (ref 140–450)
RBC: 3.6 M/uL (ref 3.60–5.20)
RDW: 61.1 — ABNORMAL HIGH (ref 36.4–46.3)
WBC: 16.4 10*3/uL — ABNORMAL HIGH (ref 4.0–11.0)

## 2022-02-23 LAB — POCT GLUCOSE
POC Glucose: 143 mg/dL — ABNORMAL HIGH (ref 65–105)
POC Glucose: 134 mg/dL — ABNORMAL HIGH (ref 65–105)
POC Glucose: 259 mg/dL — ABNORMAL HIGH (ref 65–105)
POC Glucose: 193 mg/dL — ABNORMAL HIGH (ref 65–105)

## 2022-02-23 MED FILL — MAGNESIUM OXIDE -MG SUPPLEMENT 400 (240 MG) MG PO TABS: 400 (240 Mg) MG | ORAL | Qty: 1

## 2022-02-23 MED FILL — GUAIFENESIN 100 MG/5ML PO LIQD: 100 MG/5ML | ORAL | Qty: 20

## 2022-02-23 MED FILL — PIPERACILLIN SOD-TAZOBACTAM SO 3.375 (3-0.375) G IV SOLR: 3.375 (3-0.375) g | INTRAVENOUS | Qty: 3375

## 2022-02-23 MED FILL — SODIUM CHLORIDE FLUSH 0.9 % IV SOLN: 0.9 % | INTRAVENOUS | Qty: 40

## 2022-02-23 MED FILL — QUETIAPINE FUMARATE 25 MG PO TABS: 25 MG | ORAL | Qty: 1

## 2022-02-23 MED FILL — SPIRONOLACTONE 25 MG PO TABS: 25 MG | ORAL | Qty: 1

## 2022-02-23 MED FILL — ACETYLCYSTEINE 10 % IN SOLN: 10 % | RESPIRATORY_TRACT | Qty: 4

## 2022-02-23 MED FILL — ELIQUIS 5 MG PO TABS: 5 MG | ORAL | Qty: 1

## 2022-02-23 MED FILL — ALBUTEROL SULFATE (2.5 MG/3ML) 0.083% IN NEBU: RESPIRATORY_TRACT | Qty: 3

## 2022-02-23 MED FILL — FLUCONAZOLE IN SODIUM CHLORIDE 400-0.9 MG/200ML-% IV SOLN: INTRAVENOUS | Qty: 200

## 2022-02-23 MED FILL — PANTOPRAZOLE SODIUM 40 MG PO TBEC: 40 MG | ORAL | Qty: 1

## 2022-02-23 MED FILL — ERYTHROMYCIN 5 MG/GM OP OINT: 5 MG/GM | OPHTHALMIC | Qty: 1

## 2022-02-23 MED FILL — MONTELUKAST SODIUM 10 MG PO TABS: 10 MG | ORAL | Qty: 1

## 2022-02-23 MED FILL — SILVER SULFADIAZINE 1 % EX CREA: 1 % | CUTANEOUS | Qty: 50

## 2022-02-23 MED FILL — ROSUVASTATIN CALCIUM 5 MG PO TABS: 5 MG | ORAL | Qty: 1

## 2022-02-23 MED FILL — AMLODIPINE BESYLATE 5 MG PO TABS: 5 MG | ORAL | Qty: 2

## 2022-02-23 MED FILL — LOSARTAN POTASSIUM 50 MG PO TABS: 50 MG | ORAL | Qty: 2

## 2022-02-23 MED FILL — METOPROLOL SUCCINATE ER 50 MG PO TB24: 50 MG | ORAL | Qty: 1

## 2022-02-23 MED FILL — HUMALOG 100 UNIT/ML IJ SOLN: 100 UNIT/ML | INTRAMUSCULAR | Qty: 1

## 2022-02-23 NOTE — Plan of Care (Signed)
Problem: Respiratory - Adult  Goal: Achieves optimal ventilation and oxygenation  02/23/2022 0909 by Arnell Asal, RT  Outcome: Progressing  02/23/2022 0220 by Olena Heckle, RN  Outcome: Progressing  02/22/2022 2038 by Karie Fetch, RCP  Outcome: Progressing  Flowsheets (Taken 02/22/2022 2000 by Olena Heckle, RN)  Achieves optimal ventilation and oxygenation:   Assess for changes in respiratory status   Assess for changes in mentation and behavior   Position to facilitate oxygenation and minimize respiratory effort   Oxygen supplementation based on oxygen saturation or arterial blood gases   Encourage broncho-pulmonary hygiene including cough, deep breathe, incentive spirometry   Assess the need for suctioning and aspirate as needed   Assess and instruct to report shortness of breath or any respiratory difficulty   Respiratory therapy support as indicated

## 2022-02-23 NOTE — Progress Notes (Signed)
TRANSFER - IN REPORT:    Verbal report received from Kim on [name] [name]  being received from Step down for routine progression of patient care      Report consisted of patient's Situation, Background, Assessment and   Recommendations(SBAR).     Information from the following report(s) Nurse Handoff Report, Adult Overview, Intake/Output, MAR, Recent Results, Med Rec Status, and Cardiac Rhythm    was reviewed with the receiving nurse.    Opportunity for questions and clarification was provided.      Assessment completed upon patient's arrival to unit and care assumed.

## 2022-02-23 NOTE — Progress Notes (Signed)
PHYSICAL THERAPY    Time  PT Charge Capture  Rehab Caseload Tracker       Patient: [name] [name] (71 y.o. female)  Room: 5231/5231    Primary Diagnosis: Acute hyperkalemia [E87.5]  Hyperglycemia [R73.9]  Septic shock (HCC) [A41.9, R65.21]  Acute respiratory failure with hypoxia and hypercapnia (HCC) [J96.01, J96.02]  Community acquired pneumonia of left lung, unspecified part of lung [J18.9]       Date of Admission: 02/13/2022   Insurance: Payor: [employer] MEDICARE / Plan: [employer] GOLD PLUS HMO / Product Type: *No Product type* /      Date: 02/23/2022  Time: 1400    OBJECTIVE:     Orders, labs, and chart reviewed on [name] [name].     Patient was not seen for skilled physical therapy treatment, secondary to RN in room for care.    PLAN:     Our services will follow up as patient's condition and/or schedule permit.    Dannielle Huh, PT  February 23, 2022

## 2022-02-23 NOTE — Progress Notes (Signed)
RT assessed patient while administering 2000 neb tx. Pt found on 2L NC with increased WOB, expiratory wheezes, RR 22, SpO2 92%. RT asked pt multiple times if she would like to wear BiPAP mask to aid breathing effort and both pt and husband refused.

## 2022-02-23 NOTE — Progress Notes (Addendum)
SPEECH LANGUAGE PATHOLOGY TREATMENT     Patient: [name] [name] (71 y.o. female), [birth year]  Room: 5310/5310  Primary Diagnosis: Acute hyperkalemia [E87.5]  Hyperglycemia [R73.9]  Septic shock (HCC) [A41.9, R65.21]  Acute respiratory failure with hypoxia and hypercapnia (HCC) [J96.01, J96.02]  Community acquired pneumonia of left lung, unspecified part of lung [J18.9]        Date of Admission: 02/13/2022  Length of Stay: 9 day(s)    Date: 02/23/2022   Start Time:  955 End Time:  1005    Total Minutes: 10 minutes    Isolation:  Contact        PPE: surgical mask, gloves  Precautions:  universal, aspiration, and fall      ASSESSMENT:   Orientation: Patient awake, alert  Respiratory Status: 3 liters O2 via nasal cannula    Treatment: Patient seen at bedside for follow up speech therapy. Patient laying curled on left side in the bed. SLP encouraged patient to sit up to be in a better positioning for feeding but patient refused. Patient consumed thin liquids, puree, and solids with delayed coughing noted. Patient cued to slow down but she refused and not following directions. Patient without congested cough noted. RN at bedside and patient consumed meds in puree without coughing but also refusing to sit up in bed to take meds. Recommend NPO status as patient not agreeable to follow strategies or sit up properly in bed. Discussed with patient, RN, and Dr. Tasia Catchings who said he would talk to her. Diet not changed per discussion with Dr. Tasia Catchings. SLP will follow accordingly.        PLAN/ RECOMMENDATIONS:                 Diet Recommendations: Defer diet to MD/goals of care  Compensatory Swallow Strategies: HOB >30* at all time, meticulous oral care at least 3x/ day, basic compensatory swallow strategies   Medication Administration: via alternate means   Aspiration Precautions: HOB >30* at all times, meticulous oral care, suction set up  Risk(s) for Aspiration: level of safety awareness, current level of dysphagia    FUNCTIONAL  ASSESSMENT  AM-PAC: Cognitive Raw Score: 10  -  Skilled Nursing Facility:   Current research shows that an AM-PAC score of 17 or less is associated with a discharge to the patient's home setting.  Based on an AM-PAC score and their current ADL deficits.    This AM-PAC score should be considered in conjunction with interdisciplinary team recommendations to determine the most appropriate discharge setting. Patient's social support, diagnosis, medical stability, and prior level of function should also be taken into consideration.     Therapy Recommendations: Patient would benefit from continuation of SLP services for dysphagia 2-5x/ week to address goals per POC  Therapy Prognosis: fair      Discharge Recommendations: SNF  Other Recommended Services: OT, PT     SUBJECTIVE    Patient Stated: "Go ahead then."   Pain Level: no pain reported     OBJECTIVE   Position:  Laying on left side at 20 degrees  Consistencies: thin via straw, regular and puree (IDDSI 4)  Oral Phase: prolonged mastication and disorganized mastication  Pharyngeal Phase: cough        GOALS    See POC     EDUCATION/COMMUNICATION   Education Provided: POC, deficits, and diet recommendations  Individual Educated: Patient  Comprehension: No evidence of learning  Barriers to learning/limitations: Yes;  cognitive  Staff Education: Educated MD and Nurse  to POC, deficits, and diet recommendations.      Safety: Following session, pt in bed, lowest position, HOB 20*, 4 side rails up, bed alarm on       Thank you for this referral,  Sandford Craze MA-CCC-SLP  Speech Pathologist   Office [phone]

## 2022-02-23 NOTE — Progress Notes (Signed)
Progress Note    Patient: [name] [name]   Age:  71 y.o.  DOA: 02/13/2022   Admit Dx / CC:   LOS:  LOS: 9 days     Active Problems   1.  Septic shock likely due to pneumonia improving  2.  Acute hypoxic and hypercapnic respiratory failure  3.  Hyperkalemia  4.  COPD with acute exacerbation  5.  DM type II controlled   6.  History of DVT  7.  Hypertension  8.  Macrocytic anemia likely iron deficiency  9.  Hypokalemia  10.  Hypomagnesemia  11.  Hypophosphatemia  Plan:   1.  Patient was seen and examined this morning.  Feeling little better.  Patient was strongly advised not to lay on her left side all the time patient was advised to turn every couple hours and try to get out of the bed to the chair for meals  Patient was strongly encouraged to participate with PT OT  Case manager consult done to assist with discharge planning  Patient is now medically stable to be discharged to skilled nursing facility  2.  For glycemic control patient will be continued on Glucomander protocol  3.  Patient was discussed with RN and speech therapy in detail.  RN was advised to increase patient's oxygen  Over 2 L unless oxygen saturation is less than 88  Goal is oxygen saturation between 89 to 92%  Subjective:   Feeling little better today, denies any pain  Objective:   Visit Vitals  BP 128/76   Pulse 70   Temp 97.5 F (36.4 C) (Temporal)   Resp 19   Ht 5\' 7"  (1.702 m)   Wt 215 lb 6.2 oz (97.7 kg)   SpO2 100%   BMI 33.73 kg/m       Physical Exam:  General appearance: Elderly female well-built    Laying comfortably no acute distress  Lungs: Decreased breath sounds left lung base  Heart: Regular rhythm, no murmur,   Abdomen: soft, non-tender. Bowel sounds Audible,  Extremities: Trace edema, distal pulses palpable  Neurologic: Awake and alert  Answering questions appropriately  Able to move all 4 extremities  Gait not checked  Intake and Output:  Current Shift:  No intake/output data recorded.  Last three shifts:  09/11 1901 - 09/13  0700  In: 300 [P.O.:300]  Out: 2100 [Urine:2100]    Lab/Data Reviewed:  Recent Results (from the past 48 hour(s))   POCT Glucose    Collection Time: 02/21/22  8:40 PM   Result Value Ref Range    POC Glucose 270 (H) 65 - 105 mg/dL   CBC with Auto Differential    Collection Time: 02/22/22  2:51 AM   Result Value Ref Range    WBC 17.4 (H) 4.0 - 11.0 1000/mm3    RBC 3.79 3.60 - 5.20 M/uL    Hemoglobin 10.0 (L) 11.0 - 16.0 gm/dl    Hematocrit 04/24/22 (L) 35.0 - 47.0 %    MCV 91.6 80.0 - 98.0 fL    MCH 26.4 25.4 - 34.6 pg    MCHC 28.8 (L) 30.0 - 36.0 gm/dl    Platelets 74.2 595 - 450 1000/mm3    MPV 12.0 (H) 6.0 - 10.0 fL    RDW 63.1 (H) 36.4 - 46.3      Nucleated RBCs 0 0 - 0      Immature Granulocytes 0.3 0.0 - 3.0 %    Neutrophils Segmented 79.9 (H) 34 -  64 %    Lymphocytes 10.1 (L) 28 - 48 %    Monocytes 7.9 1 - 13 %    Eosinophils 1.6 0 - 5 %    Basophils 0.2 0 - 3 %   Comprehensive Metabolic Panel    Collection Time: 02/22/22  2:51 AM   Result Value Ref Range    Potassium 4.8 3.5 - 5.1 mEq/L    Chloride 103 98 - 107 mEq/L    Sodium 142 136 - 145 mEq/L    CO2 36 (H) 20 - 31 mEq/L    Glucose 153 (H) 74 - 106 mg/dl    BUN 18 9 - 23 mg/dl    Creatinine 3.41 (L) 0.55 - 1.02 mg/dl    GFR African American >60.0      GFR Non-African American >60      Calcium 9.3 8.7 - 10.4 mg/dl    Anion Gap 3 (L) 5 - 15 mmol/L    AST 10.0 0.0 - 33.9 U/L    ALT 11 10 - 49 U/L    Alkaline Phosphatase 65 46 - 116 U/L    Total Bilirubin 0.40 0.30 - 1.20 mg/dl    Total Protein 6.5 5.7 - 8.2 gm/dl    Albumin 3.3 (L) 3.4 - 5.0 gm/dl   POCT Glucose    Collection Time: 02/22/22  7:44 AM   Result Value Ref Range    POC Glucose 177 (H) 65 - 105 mg/dL   POCT Glucose    Collection Time: 02/22/22 11:25 AM   Result Value Ref Range    POC Glucose 286 (H) 65 - 105 mg/dL   POCT Glucose    Collection Time: 02/22/22  4:33 PM   Result Value Ref Range    POC Glucose 182 (H) 65 - 105 mg/dL   POCT Glucose    Collection Time: 02/22/22  9:42 PM   Result Value Ref Range     POC Glucose 143 (H) 65 - 105 mg/dL   CBC with Auto Differential    Collection Time: 02/23/22  3:53 AM   Result Value Ref Range    WBC 16.4 (H) 4.0 - 11.0 1000/mm3    Neutrophils Segmented 72.0 (H) 34 - 64 %    RBC 3.60 3.60 - 5.20 M/uL    Lymphocytes 12.0 (L) 28 - 48 %    Hemoglobin 9.8 (L) 11.0 - 16.0 gm/dl    Monocytes 93.7 (H) 1 - 13 %    Hematocrit 32.9 (L) 35.0 - 47.0 %    MCV 91.4 80.0 - 98.0 fL    MCH 27.2 25.4 - 34.6 pg    MCHC 29.8 (L) 30.0 - 36.0 gm/dl    Platelets 902 (L) 409 - 450 1000/mm3    MPV 12.3 (H) 6.0 - 10.0 fL    RDW 61.1 (H) 36.4 - 46.3      Nucleated RBCs 0 0 - 0      Anisocytosis 2      Macrocytes 2      Platelet Appearance DECREASED     Comprehensive Metabolic Panel    Collection Time: 02/23/22  3:53 AM   Result Value Ref Range    Potassium 4.7 3.5 - 5.1 mEq/L    Chloride 101 98 - 107 mEq/L    Sodium 142 136 - 145 mEq/L    CO2 37 (H) 20 - 31 mEq/L    Glucose 128 (H) 74 - 106 mg/dl    BUN 14 9 - 23 mg/dl  Creatinine 0.50 (L) 0.55 - 1.02 mg/dl    GFR African American >60.0      GFR Non-African American >60      Calcium 9.1 8.7 - 10.4 mg/dl    Anion Gap 4 (L) 5 - 15 mmol/L    AST 10.0 0.0 - 33.9 U/L    ALT 9 (L) 10 - 49 U/L    Alkaline Phosphatase 59 46 - 116 U/L    Total Bilirubin 0.30 0.30 - 1.20 mg/dl    Total Protein 5.9 5.7 - 8.2 gm/dl    Albumin 3.0 (L) 3.4 - 5.0 gm/dl   POCT Glucose    Collection Time: 02/23/22  7:52 AM   Result Value Ref Range    POC Glucose 134 (H) 65 - 105 mg/dL   POCT Glucose    Collection Time: 02/23/22 12:19 PM   Result Value Ref Range    POC Glucose 259 (H) 65 - 105 mg/dL   POCT Glucose    Collection Time: 02/23/22  5:07 PM   Result Value Ref Range    POC Glucose 193 (H) 65 - 105 mg/dL       Imaging:  @[email or web address] @    Medications Reviewed:  Current Facility-Administered Medications   Medication Dose Route Frequency    albuterol (PROVENTIL) (2.5 MG/3ML) 0.083% nebulizer solution 2.5 mg  2.5 mg Nebulization Q6H    guaiFENesin (ROBITUSSIN) 100 MG/5ML liquid 400 mg   400 mg Oral TID    metoprolol succinate (TOPROL XL) extended release tablet 50 mg  50 mg Oral Daily    pantoprazole (PROTONIX) tablet 40 mg  40 mg Oral QAM AC    spironolactone (ALDACTONE) tablet 25 mg  25 mg Oral Daily    potassium chloride 20 mEq/50 mL IVPB (Central Line)  20 mEq IntraVENous PRN    Or    potassium chloride 10 mEq/100 mL IVPB (Peripheral Line)  10 mEq IntraVENous PRN    magnesium sulfate 2000 mg in 50 mL IVPB premix  2,000 mg IntraVENous PRN    magnesium oxide (MAG-OX) tablet 400 mg  400 mg Oral BID    potassium chloride 20 mEq in 100 mL IVPB  20 mEq IntraVENous Once    apixaban (ELIQUIS) tablet 5 mg  5 mg Oral BID    amLODIPine (NORVASC) tablet 10 mg  10 mg Oral Daily    losartan (COZAAR) tablet 100 mg  100 mg Oral Daily    sodium hypochlorite (DAKINS) 0.125 % external solution   Topical Daily    fluconazole (DIFLUCAN) in 0.9 % sodium chloride IVPB 400 mg  400 mg IntraVENous Q24H    silver sulfADIAZINE (SILVADENE) 1 % cream   Topical BID    erythromycin (ROMYCIN) ophthalmic ointment   Both Eyes 3 times per day    prochlorperazine (COMPAZINE) injection 10 mg  10 mg IntraVENous Q6H PRN    insulin lispro (HUMALOG) injection vial 1-100 Units  1-100 Units SubCUTAneous 4x Daily AC & HS    QUEtiapine (SEROQUEL) tablet 25 mg  25 mg Oral Nightly    sodium chloride flush 0.9 % injection 5-40 mL  5-40 mL IntraVENous BID    fluticasone furoate-vilanterol (BREO ELLIPTA) 100-25 MCG/ACT inhaler 1 puff  1 puff Inhalation Daily    montelukast (SINGULAIR) tablet 10 mg  10 mg Oral Daily    rosuvastatin (CRESTOR) tablet 5 mg  5 mg Oral Nightly    piperacillin-tazobactam (ZOSYN) 3,375 mg in sodium chloride 0.9 % 100 mL IVPB (mini-bag)  3,375 mg  IntraVENous Q8H    insulin glargine (LANTUS) injection vial 1-100 Units  1-100 Units SubCUTAneous QHS    sodium chloride flush 0.9 % injection 5-40 mL  5-40 mL IntraVENous PRN    0.9 % sodium chloride infusion   IntraVENous PRN    ondansetron (ZOFRAN-ODT) disintegrating tablet  4 mg  4 mg Oral Q8H PRN    Or    ondansetron (ZOFRAN) injection 4 mg  4 mg IntraVENous Q6H PRN    polyethylene glycol (GLYCOLAX) packet 17 g  17 g Oral Daily PRN    acetaminophen (TYLENOL) tablet 650 mg  650 mg Oral Q6H PRN    Or    acetaminophen (TYLENOL) suppository 650 mg  650 mg Rectal Q6H PRN    glucagon (rDNA) injection 1 mg  1 mg IntraMUSCular PRN    dextrose 50 % IV solution  20-30 mL IntraVENous PRN         Dragon medical dictation software was used for portions of this report. Unintended errors may occur.   [name], MD  P# [phone]  February 23, 2022

## 2022-02-23 NOTE — Progress Notes (Signed)
PHYSICAL THERAPY    Time  PT Charge Capture  Rehab Caseload Tracker       Patient: [name] [name] (71 y.o. female)  Room: 5310/5310    Primary Diagnosis: Acute hyperkalemia [E87.5]  Hyperglycemia [R73.9]  Septic shock (HCC) [A41.9, R65.21]  Acute respiratory failure with hypoxia and hypercapnia (HCC) [J96.01, J96.02]  Community acquired pneumonia of left lung, unspecified part of lung [J18.9]       Date of Admission: 02/13/2022   Insurance: Payor: [employer] MEDICARE / Plan: [employer] GOLD PLUS HMO / Product Type: *No Product type* /      Date: 02/23/2022  Time: 1245    OBJECTIVE:     Orders, labs, and chart reviewed on Mylinh R Esperanza. Discussed with patient's nurse.    Patient was not seen for skilled physical therapy treatment, secondary to in process of being transported to [facility].    PLAN:     Our services will follow up as patient's condition and/or schedule permit.    Dannielle Huh, PT  February 23, 2022

## 2022-02-23 NOTE — Plan of Care (Signed)
Problem: SLP Adult - Impaired Swallowing  Goal: Effective swallowing  Description: Short term Goals: 3-4x/wk x 1 week  Patient will:  1. Tolerate recommended diet with </= 1 overt/ soft/ silent s/s aspiration or distress b/s for adequate po nutrition/ hydration  2. Tolerate diet upgrades with </= 1 overt/ soft/ silent s/s aspiration or distress b/s for adequate po nutrition/ hydration      Ongoing  Short term Goals: 3-4x/wk x 1 week  Patient will:  1. Tolerate recommended diet with </= 1 overt/ soft/ silent s/s aspiration or distress b/s for adequate po nutrition/ hydration  2. Tolerate diet upgrades with </= 1 overt/ soft/ silent s/s aspiration or distress b/s for adequate po nutrition/ hydration    Outcome: Progressing

## 2022-02-23 NOTE — Progress Notes (Signed)
0110: Pt removed BiPAP, Sp02 84%.    Placed back on 3LPM NC. SP02 92%    Currently 100%

## 2022-02-23 NOTE — Progress Notes (Signed)
BiPAP Progress Note    Patient currently is Off BiPAP  Patient off BiPAP for the PM shift    Pt. wore BIPAP x1hr, then removed mask. Will continue to encourage pt use.    BiPAP settings are:  Serial #[id number]  IPAP:  18 cmH20   EPAP: 8 cmH2O  Rate:   12  FiO2:   35 %

## 2022-02-23 NOTE — Plan of Care (Signed)
Problem: Discharge Planning  Goal: Discharge to home or other facility with appropriate resources  Recent Flowsheet Documentation  Taken 02/23/2022 1337 by Chesley Noon, RN  Discharge to home or other facility with appropriate resources:   Identify barriers to discharge with patient and caregiver   Arrange for needed discharge resources and transportation as appropriate   Identify discharge learning needs (meds, wound care, etc)
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
## 2022-02-24 LAB — COMPREHENSIVE METABOLIC PANEL
ALT: 9 U/L — ABNORMAL LOW (ref 10–49)
AST: 12 U/L (ref 0.0–33.9)
Albumin: 2.9 gm/dl — ABNORMAL LOW (ref 3.4–5.0)
Alkaline Phosphatase: 67 U/L (ref 46–116)
Anion Gap: 4 mmol/L — ABNORMAL LOW (ref 5–15)
BUN: 17 mg/dl (ref 9–23)
CO2: 37 mEq/L — ABNORMAL HIGH (ref 20–31)
Calcium: 9.2 mg/dl (ref 8.7–10.4)
Chloride: 100 mEq/L (ref 98–107)
Creatinine: 0.52 mg/dl — ABNORMAL LOW (ref 0.55–1.02)
GFR African American: >60
GFR Non-African American: >60
Glucose: 150 mg/dl — ABNORMAL HIGH (ref 74–106)
Potassium: 4.2 mEq/L (ref 3.5–5.1)
Sodium: 141 mEq/L (ref 136–145)
Total Bilirubin: 0.4 mg/dl (ref 0.30–1.20)
Total Protein: 6.3 gm/dl (ref 5.7–8.2)

## 2022-02-24 LAB — CBC WITH AUTO DIFFERENTIAL
Basophils: 0.3 % (ref 0–3)
Eosinophils: 1.3 % (ref 0–5)
Hematocrit: 31.8 % — ABNORMAL LOW (ref 35.0–47.0)
Hemoglobin: 9.4 gm/dl — ABNORMAL LOW (ref 11.0–16.0)
Immature Granulocytes: 0.3 % (ref 0.0–3.0)
Lymphocytes: 11.5 % — ABNORMAL LOW (ref 28–48)
MCH: 26.7 pg (ref 25.4–34.6)
MCHC: 29.6 gm/dl — ABNORMAL LOW (ref 30.0–36.0)
MCV: 90.3 fL (ref 80.0–98.0)
MPV: 12.1 fL — ABNORMAL HIGH (ref 6.0–10.0)
Monocytes: 8.6 % (ref 1–13)
Neutrophils Segmented: 78 % — ABNORMAL HIGH (ref 34–64)
Nucleated RBCs: 0 (ref 0–0)
Platelets: 140 10*3/uL (ref 140–450)
RBC: 3.52 M/uL — ABNORMAL LOW (ref 3.60–5.20)
RDW: 59.7 — ABNORMAL HIGH (ref 36.4–46.3)
WBC: 14.2 10*3/uL — ABNORMAL HIGH (ref 4.0–11.0)

## 2022-02-24 LAB — POCT GLUCOSE
POC Glucose: 147 mg/dL — ABNORMAL HIGH (ref 65–105)
POC Glucose: 148 mg/dL — ABNORMAL HIGH (ref 65–105)
POC Glucose: 150 mg/dL — ABNORMAL HIGH (ref 65–105)
POC Glucose: 144 mg/dL — ABNORMAL HIGH (ref 65–105)

## 2022-02-24 MED ORDER — BUPROPION HCL ER (XL) 150 MG PO TB24
150 MG | Freq: Every day | ORAL | Status: DC
Start: 2022-02-24 — End: 2022-02-26
  Administered 2022-02-24 – 2022-02-26 (×3): 150 mg via ORAL

## 2022-02-24 MED FILL — MAGNESIUM OXIDE -MG SUPPLEMENT 400 (240 MG) MG PO TABS: 400 (240 Mg) MG | ORAL | Qty: 1

## 2022-02-24 MED FILL — ALBUTEROL SULFATE (2.5 MG/3ML) 0.083% IN NEBU: RESPIRATORY_TRACT | Qty: 3

## 2022-02-24 MED FILL — GUAIFENESIN 100 MG/5ML PO LIQD: 100 MG/5ML | ORAL | Qty: 20

## 2022-02-24 MED FILL — LOSARTAN POTASSIUM 50 MG PO TABS: 50 MG | ORAL | Qty: 2

## 2022-02-24 MED FILL — QUETIAPINE FUMARATE 25 MG PO TABS: 25 MG | ORAL | Qty: 1

## 2022-02-24 MED FILL — ELIQUIS 5 MG PO TABS: 5 MG | ORAL | Qty: 1

## 2022-02-24 MED FILL — BUPROPION HCL ER (XL) 150 MG PO TB24: 150 MG | ORAL | Qty: 1

## 2022-02-24 MED FILL — ERYTHROMYCIN 5 MG/GM OP OINT: 5 MG/GM | OPHTHALMIC | Qty: 1

## 2022-02-24 MED FILL — SODIUM CHLORIDE FLUSH 0.9 % IV SOLN: 0.9 % | INTRAVENOUS | Qty: 10

## 2022-02-24 MED FILL — ROSUVASTATIN CALCIUM 10 MG PO TABS: 10 MG | ORAL | Qty: 1

## 2022-02-24 MED FILL — MONTELUKAST SODIUM 10 MG PO TABS: 10 MG | ORAL | Qty: 1

## 2022-02-24 MED FILL — SPIRONOLACTONE 25 MG PO TABS: 25 MG | ORAL | Qty: 1

## 2022-02-24 MED FILL — AMLODIPINE BESYLATE 5 MG PO TABS: 5 MG | ORAL | Qty: 2

## 2022-02-24 MED FILL — DAKINS (1/4 STRENGTH) 0.125 % EX SOLN: 0.125 % | CUTANEOUS | Qty: 473

## 2022-02-24 MED FILL — PIPERACILLIN SOD-TAZOBACTAM SO 3.375 (3-0.375) G IV SOLR: 3.375 (3-0.375) g | INTRAVENOUS | Qty: 3375

## 2022-02-24 MED FILL — METOPROLOL SUCCINATE ER 50 MG PO TB24: 50 MG | ORAL | Qty: 1

## 2022-02-24 MED FILL — SILVER SULFADIAZINE 1 % EX CREA: 1 % | CUTANEOUS | Qty: 50

## 2022-02-24 MED FILL — BREO ELLIPTA 100-25 MCG/ACT IN AEPB: 100-25 MCG/ACT | RESPIRATORY_TRACT | Qty: 28

## 2022-02-24 NOTE — Plan of Care (Signed)
Problem: Respiratory - Adult  Goal: Achieves optimal ventilation and oxygenation  02/24/2022 0754 by Joanna Puff, RCP  Outcome: Progressing

## 2022-02-24 NOTE — Plan of Care (Signed)
Problem: Respiratory - Adult  Goal: Achieves optimal ventilation and oxygenation  02/23/2022 2017 by Colman Cater, RCP  Outcome: Progressing  Flowsheets (Taken 02/23/2022 1337 by Chesley Noon, RN)  Achieves optimal ventilation and oxygenation:   Assess for changes in respiratory status   Assess for changes in mentation and behavior   Position to facilitate oxygenation and minimize respiratory effort     Problem: Safety - Adult  Goal: Free from fall injury  02/23/2022 1620 by Chesley Noon, RN  Outcome: Progressing  Flowsheets (Taken 02/23/2022 0218 by Olena Heckle, RN)  Free From Fall Injury: Instruct family/caregiver on patient safety     Problem: Skin/Tissue Integrity  Goal: Absence of new skin breakdown  Description: 1.  Monitor for areas of redness and/or skin breakdown  2.  Assess vascular access sites hourly  3.  Every 4-6 hours minimum:  Change oxygen saturation probe site  4.  Every 4-6 hours:  If on nasal continuous positive airway pressure, respiratory therapy assess nares and determine need for appliance change or resting period.  Outcome: Progressing     Problem: SLP Adult - Impaired Swallowing  Goal: Effective swallowing  Description: Short term Goals: 3-4x/wk x 1 week  Patient will:  1. Tolerate recommended diet with </= 1 overt/ soft/ silent s/s aspiration or distress b/s for adequate po nutrition/ hydration  2. Tolerate diet upgrades with </= 1 overt/ soft/ silent s/s aspiration or distress b/s for adequate po nutrition/ hydration      Ongoing  Short term Goals: 3-4x/wk x 1 week  Patient will:  1. Tolerate recommended diet with </= 1 overt/ soft/ silent s/s aspiration or distress b/s for adequate po nutrition/ hydration  2. Tolerate diet upgrades with </= 1 overt/ soft/ silent s/s aspiration or distress b/s for adequate po nutrition/ hydration    02/23/2022 1154 by Paulla Dolly, SLP  Outcome: Progressing     Problem: ABCDS Injury Assessment  Goal: Absence of physical injury  02/23/2022 1153 by  Paulla Dolly, SLP  Outcome: Progressing

## 2022-02-24 NOTE — Progress Notes (Signed)
PHYSICAL THERAPY TREATMENT:   Time  PT Charge Capture  Rehab Caseload Tracker  [place]    -    Patient: [name] [name] (71 y.o. female)  Room: 5231/5231    Primary Diagnosis: Acute hyperkalemia [E87.5]  Hyperglycemia [R73.9]  Septic shock (HCC) [A41.9, R65.21]  Acute respiratory failure with hypoxia and hypercapnia (HCC) [J96.01, J96.02]  Community acquired pneumonia of left lung, unspecified part of lung [J18.9]       Length of Stay:  10 day(s)   Insurance: Payor: [employer] MEDICARE / Plan: [employer] GOLD PLUS HMO / Product Type: *No Product type* /      Date: 02/24/2022  Time In: 0941       Time Out: 1009   Total Minutes: 28    Isolation:  Contact       MDRO: MRSA    Precautions: falls, decreased cognition   Ordered weight bearing status: no weight bearing restriction     ASSESSMENT:      Based on the objective data described below, the patient presents with  - requires increased time, rest breaks and repeated attempts with functional tasks  - functionally very weak  - deconditioning  - unable to ambulate    Pt willing to work with PT, although requires increased encouragement and support. MD to room at some point in time during treatment to assess and motivate.  Waxing and waning effort. Very slow and labored mobility.  Rolling with max assist. Pt presents sidelying on left.  Pt demo fair tolerance with thera ex.  Worked on bed mobility. Scooting to [facility] in sidelying with use of bed rails max assist.     Multiple attempts to sit up to EOB. Pt briefly sat at EOB, than requesting to return to sidelying. Encouraged pt to attempt again. Max assist to sit to EOB, propped with wedges and  pillows. Pt sat at EOB with support ~ 16 min. During session, crossover with speech therapy for pt to eat sitting at EOB.     Recommendations:  Recommend continued physical therapy during acute stay. Physical Therapy, Occupational Therapy, and Speech Therapy. Recommend out of bed activity to counteract ill effects of bedrest, with  assistance from staff as needed.  Discharge Recommendations: Skilled nursing facility (SNF): patient will benefit from further therapy at rehab facility to increase strength and endurance to return to prior level of function.  Further Equipment Recommendations for Discharge: DME to be determined at time of discharge from SNF.     PLAN:      Patient will be followed by physical therapy to address goals per initial plan of care.    COMMUNICATION/EDUCATION:     Education: Ill effects of bedrest, benefits of activity, activity as tolerated to counteract ill effects of bedrest, promote healing and return to prior level of function, ankle pumps to promote improved circulation and prevent DVTs, positioning.    Education provided to: patient  Opportunity for questions and clarification was provided.    Readiness to learn indicated by: verbalized understanding    Barriers to learning/limitations: cognitive and physical    Comprehension:  fair comprehension     SUBJECTIVE:      Patient agreeable to PT  " I just don't feel good"   Patient reports not rated /10 pain before treatment and not rated /10 pain at conclusion of treatment.  Pain Location: none specified     OBJECTIVE DATA SUMMARY:      Orders, labs, and chart reviewed on [name] [name].  Communicated with patient's nurse (patient ok to be seen by PT).     PATIENT FOUND:     (+) bed alarm. (+) O2 nasal cannula. Sidelying to Left     COGNITIVE STATUS:     Mental Status:  Oriented to, person, place, alert, and awake   Communication:  soft spoken, limited verbalization   Follows commands:  follows two step commands/direction   General cognition:  slow processing, delayed responses, slowed concentration, needs cues to stay on task, impaired problem solving   Safety/Judgement:  diminished situational and deficit awareness     THERAPEUTIC ACTIVITIES; FUNCTIONAL MOBILITY AND BALANCE STATUS:      Bed mobility:    Sup->sit: max assist; ;   Sit->supine:  mod/max assist; ;      Transfers:  Sit - stand: not tested;      Gait Training:  0 feet,         Balance:   Static sitting balance:          fair       Dynamic sitting balance:     fair-       Static standing balance:      not tested      Dynamic standing balance: not tested        THERAPEUTIC EXERCISES:      rest breaks provided, 10 reps each:  ankle pumps, heel slides, hip ab/duction, straight leg raise, AAROM BLE in all planes    Balance activities:   Worked to improve reactive postural responses: stable surface, sitting, sitting at edge of bed, weight shift, reach out of base of support.   Required verbal and manual cues for safety and technique.     ACTIVITY TOLERANCE:     - requires increased time  - decreased tolerance to sustained activity  - poor tolerance to sustained activity  - generalized fatigue    FINAL LOCATION:     Positioned with pillows for comfort. (+) bed alarm. (+) care partner notified. Pt positioned supine in bed with HOB elevated , able to tolerate, encouraged this position vs. Constant left sidelying.     Thank you for this referral.  Dannielle Huh, PT

## 2022-02-24 NOTE — Progress Notes (Signed)
[facility]                                                                                                 PATIENT INFORMATION   Patient Name: [name] [name], [name] [name] Acct: [id number] Patient MRN: [id number]   Address: [address]  [place] [address] Patient CSN: [id number]      Religion: [employer]   Sex: Female Marital Status: Married   DOB: 12/21/1950 Age:   71 yrs   Home Phone: [phone]  Mobile Phone:   [phone]        Race: Black / African American Employer:      Language: English Admitted/Arrived From:      ADMISSION INFORMATION   Admit Date: 02/13/2022 Admit Time: 2333   Patient Class: Inpatient Service: Medical   Admit Source: Non-health care facility* Admit Type: Emergency   Admitting Provider: Irving Burton Attending Provider: [name]   Unit: [facility] Room/Bed: 5231/5231    Admission Diagnosis: Acute hyperkalemia [E87.* Acute hyperkalemia, Hyperglycemia, Septic shock (HCC), Acute respiratory failure with hypoxia and hypercapnia (HCC), Community acquired pneumonia of left lung, unspecified part of lung and DX codes: E87.5, R73.9, A41.9, R65.21, J96.01, J96.02, J18.9   Emergency Complaint: respiratory distress                 Discharge Date:   Discharge Time:     GUARANTOR INFORMATION   Name:  Christol, [place] Address: [address]  Rel:  Self   Phone: [phone]   [place], [place] [address] DOB: 1951/01/29   EMERGENCY CONTACTS   Name: Heywood Iles Home:  Mobile: [phone]  [phone] Rel: Spouse   COVERAGE INFORMATION   Primary Insurance:   Francine Graven MEDICARE Subscriber: [name]   Plan Name: [employer]mo Pt Rel to Subscriber: Self [01]   Claim Address: [address] [address]  [place], [place] [address] Sex: Female      Policy #:  [id number]    Group #: [id number] Group Name:   [employer] *   Auth #: Auth number: [id number] Ins Phone:         Secondary Insurance:   Subscriber:     Plan Name:   Pt Rel to Subscriber:     Claim  Address: NA Sex:       Policy #: N/A    Group #: N/A Group Name: N/A   Auth #: N/A Ins Phone:         Accident Date:    Accident Type:     PROVIDER INFORMATION   PCP:         [name], PA-C PCP Phone:  [phone]   Referring Prov:   No ref. provider found Referring Phone:  Referring Fax:  N/A      Advanced Directive:  <no information> Research:     Lab Client:   Enrollment Status:

## 2022-02-24 NOTE — Care Coordination-Inpatient (Signed)
Received patient in transfer from SDU. Per MD patient is ready for discharge, per spouse they would like [facility] , patient has been there before , patient will require an [place]. Patient needs updated PT and OT notes , message sent to therapy . Once notes received will let facility know and submit for auth.

## 2022-02-24 NOTE — Progress Notes (Signed)
Progress Note    Patient: [name] [name]   Age:  71 y.o.  DOA: 02/13/2022   Admit Dx / CC:   LOS:  LOS: 10 days     Active Problems   1.  Septic shock likely due to pneumonia improving  2.  Acute hypoxic and hypercapnic respiratory failure  3.  Hyperkalemia  4.  COPD with acute exacerbation  5.  DM type II controlled   6.  History of DVT  7.  Hypertension  8.  Macrocytic anemia likely iron deficiency  9.  Hypokalemia  10.  Hypomagnesemia  11.  Hypophosphatemia  12.  Clinical depression  Plan:   1.  Patient was seen and examined this morning.  Not feeling well, she was somewhat reluctant to participate with PT OT  But constant encouragement and motivation from therapist patient was able to sit on the side of the bed for few minutes.  Patient was strongly advised to participate with PT OT.  Patient was told  If she does not participate with PT OT she does not want to get better  Patient was strongly encouraged to get out of the bed to the chair  Patient strongly encouraged incentive spirometry and try to avoid laying on the left side which is making left-sided pleural effusion worse and causing some atelectatic changes  2.  For glycemic control patient will be continued on Glucomander protocol.  Patient also seems to be depressed and withdrawn mental health clinician consult was done.  Patient was started on Wellbutrin 150 mg daily in the morning  3.  Patient has tendency to develop hypercapnia.  Patient's oxygen will be down titrated to keep oxygen saturation between 89 to 92%  We will try not to get her oxygen saturation overcorrected  Least amount of oxygen she needs a better test  4.  We will repeat CBC and chemistry in a.m. patient was discussed with case manager in detail.  Patient is waiting for insurance authorization to go to skilled nursing facility  Subjective:   Not feeling well reluctant to participate with therapy  Objective:   Visit Vitals  BP 124/79   Pulse 80   Temp 98.1 F (36.7 C) (Temporal)   Resp  19   Ht 5\' 7"  (1.702 m)   Wt 215 lb 6.2 oz (97.7 kg)   SpO2 96%   BMI 33.73 kg/m       Physical Exam:  General appearance: Elderly female somewhat withdrawn  And not interested in therapy  Lungs: Decreased breath sounds left lung base  Heart: Regular rhythm, no murmur,   Abdomen: soft, non-tender. Bowel sounds Audible,  Extremities: Trace edema, distal pulses palpable  Neurologic: Awake and alert  Answering questions appropriately  Able to sit on the side of the bed with assistance  Intake and Output:  Current Shift:  09/14 0701 - 09/14 1900  In: 360 [P.O.:360]  Out: 1000 [Urine:1000]  Last three shifts:  09/12 1901 - 09/14 0700  In: 240 [P.O.:240]  Out: 2600 [Urine:2600]    Lab/Data Reviewed:  Recent Results (from the past 48 hour(s))   POCT Glucose    Collection Time: 02/22/22  9:42 PM   Result Value Ref Range    POC Glucose 143 (H) 65 - 105 mg/dL   CBC with Auto Differential    Collection Time: 02/23/22  3:53 AM   Result Value Ref Range    WBC 16.4 (H) 4.0 - 11.0 1000/mm3    Neutrophils Segmented 72.0 (  H) 34 - 64 %    RBC 3.60 3.60 - 5.20 M/uL    Lymphocytes 12.0 (L) 28 - 48 %    Hemoglobin 9.8 (L) 11.0 - 16.0 gm/dl    Monocytes 95.2 (H) 1 - 13 %    Hematocrit 32.9 (L) 35.0 - 47.0 %    MCV 91.4 80.0 - 98.0 fL    MCH 27.2 25.4 - 34.6 pg    MCHC 29.8 (L) 30.0 - 36.0 gm/dl    Platelets 841 (L) 324 - 450 1000/mm3    MPV 12.3 (H) 6.0 - 10.0 fL    RDW 61.1 (H) 36.4 - 46.3      Nucleated RBCs 0 0 - 0      Anisocytosis 2      Macrocytes 2      Platelet Appearance DECREASED     Comprehensive Metabolic Panel    Collection Time: 02/23/22  3:53 AM   Result Value Ref Range    Potassium 4.7 3.5 - 5.1 mEq/L    Chloride 101 98 - 107 mEq/L    Sodium 142 136 - 145 mEq/L    CO2 37 (H) 20 - 31 mEq/L    Glucose 128 (H) 74 - 106 mg/dl    BUN 14 9 - 23 mg/dl    Creatinine 4.01 (L) 0.55 - 1.02 mg/dl    GFR African American >60.0      GFR Non-African American >60      Calcium 9.1 8.7 - 10.4 mg/dl    Anion Gap 4 (L) 5 - 15 mmol/L    AST  10.0 0.0 - 33.9 U/L    ALT 9 (L) 10 - 49 U/L    Alkaline Phosphatase 59 46 - 116 U/L    Total Bilirubin 0.30 0.30 - 1.20 mg/dl    Total Protein 5.9 5.7 - 8.2 gm/dl    Albumin 3.0 (L) 3.4 - 5.0 gm/dl   POCT Glucose    Collection Time: 02/23/22  7:52 AM   Result Value Ref Range    POC Glucose 134 (H) 65 - 105 mg/dL   POCT Glucose    Collection Time: 02/23/22 12:19 PM   Result Value Ref Range    POC Glucose 259 (H) 65 - 105 mg/dL   POCT Glucose    Collection Time: 02/23/22  5:07 PM   Result Value Ref Range    POC Glucose 193 (H) 65 - 105 mg/dL   POCT Glucose    Collection Time: 02/23/22  8:45 PM   Result Value Ref Range    POC Glucose 147 (H) 65 - 105 mg/dL   CBC with Auto Differential    Collection Time: 02/24/22  6:10 AM   Result Value Ref Range    WBC 14.2 (H) 4.0 - 11.0 1000/mm3    RBC 3.52 (L) 3.60 - 5.20 M/uL    Hemoglobin 9.4 (L) 11.0 - 16.0 gm/dl    Hematocrit 02.7 (L) 35.0 - 47.0 %    MCV 90.3 80.0 - 98.0 fL    MCH 26.7 25.4 - 34.6 pg    MCHC 29.6 (L) 30.0 - 36.0 gm/dl    Platelets 253 664 - 450 1000/mm3    MPV 12.1 (H) 6.0 - 10.0 fL    RDW 59.7 (H) 36.4 - 46.3      Nucleated RBCs 0 0 - 0      Immature Granulocytes 0.3 0.0 - 3.0 %    Neutrophils Segmented 78.0 (H) 34 - 64 %  Lymphocytes 11.5 (L) 28 - 48 %    Monocytes 8.6 1 - 13 %    Eosinophils 1.3 0 - 5 %    Basophils 0.3 0 - 3 %   Comprehensive Metabolic Panel    Collection Time: 02/24/22  6:10 AM   Result Value Ref Range    Potassium 4.2 3.5 - 5.1 mEq/L    Chloride 100 98 - 107 mEq/L    Sodium 141 136 - 145 mEq/L    CO2 37 (H) 20 - 31 mEq/L    Glucose 150 (H) 74 - 106 mg/dl    BUN 17 9 - 23 mg/dl    Creatinine 6.76 (L) 0.55 - 1.02 mg/dl    GFR African American >60.0      GFR Non-African American >60      Calcium 9.2 8.7 - 10.4 mg/dl    Anion Gap 4 (L) 5 - 15 mmol/L    AST 12.0 0.0 - 33.9 U/L    ALT 9 (L) 10 - 49 U/L    Alkaline Phosphatase 67 46 - 116 U/L    Total Bilirubin 0.40 0.30 - 1.20 mg/dl    Total Protein 6.3 5.7 - 8.2 gm/dl    Albumin 2.9 (L)  3.4 - 5.0 gm/dl   POCT Glucose    Collection Time: 02/24/22  7:21 AM   Result Value Ref Range    POC Glucose 148 (H) 65 - 105 mg/dL   POCT Glucose    Collection Time: 02/24/22 11:18 AM   Result Value Ref Range    POC Glucose 150 (H) 65 - 105 mg/dL   POCT Glucose    Collection Time: 02/24/22  5:03 PM   Result Value Ref Range    POC Glucose 144 (H) 65 - 105 mg/dL       Imaging:  @[email or web address] @    Medications Reviewed:  Current Facility-Administered Medications   Medication Dose Route Frequency    buPROPion (WELLBUTRIN XL) extended release tablet 150 mg  150 mg Oral Daily    albuterol (PROVENTIL) (2.5 MG/3ML) 0.083% nebulizer solution 2.5 mg  2.5 mg Nebulization Q6H    guaiFENesin (ROBITUSSIN) 100 MG/5ML liquid 400 mg  400 mg Oral TID    metoprolol succinate (TOPROL XL) extended release tablet 50 mg  50 mg Oral Daily    pantoprazole (PROTONIX) tablet 40 mg  40 mg Oral QAM AC    spironolactone (ALDACTONE) tablet 25 mg  25 mg Oral Daily    potassium chloride 20 mEq/50 mL IVPB (Central Line)  20 mEq IntraVENous PRN    Or    potassium chloride 10 mEq/100 mL IVPB (Peripheral Line)  10 mEq IntraVENous PRN    magnesium sulfate 2000 mg in 50 mL IVPB premix  2,000 mg IntraVENous PRN    magnesium oxide (MAG-OX) tablet 400 mg  400 mg Oral BID    potassium chloride 20 mEq in 100 mL IVPB  20 mEq IntraVENous Once    apixaban (ELIQUIS) tablet 5 mg  5 mg Oral BID    amLODIPine (NORVASC) tablet 10 mg  10 mg Oral Daily    losartan (COZAAR) tablet 100 mg  100 mg Oral Daily    sodium hypochlorite (DAKINS) 0.125 % external solution   Topical Daily    fluconazole (DIFLUCAN) in 0.9 % sodium chloride IVPB 400 mg  400 mg IntraVENous Q24H    silver sulfADIAZINE (SILVADENE) 1 % cream   Topical BID    erythromycin (ROMYCIN) ophthalmic ointment   Both  Eyes 3 times per day    prochlorperazine (COMPAZINE) injection 10 mg  10 mg IntraVENous Q6H PRN    insulin lispro (HUMALOG) injection vial 1-100 Units  1-100 Units SubCUTAneous 4x Daily AC & HS     sodium chloride flush 0.9 % injection 5-40 mL  5-40 mL IntraVENous BID    fluticasone furoate-vilanterol (BREO ELLIPTA) 100-25 MCG/ACT inhaler 1 puff  1 puff Inhalation Daily    montelukast (SINGULAIR) tablet 10 mg  10 mg Oral Daily    rosuvastatin (CRESTOR) tablet 5 mg  5 mg Oral Nightly    piperacillin-tazobactam (ZOSYN) 3,375 mg in sodium chloride 0.9 % 100 mL IVPB (mini-bag)  3,375 mg IntraVENous Q8H    insulin glargine (LANTUS) injection vial 1-100 Units  1-100 Units SubCUTAneous QHS    sodium chloride flush 0.9 % injection 5-40 mL  5-40 mL IntraVENous PRN    0.9 % sodium chloride infusion   IntraVENous PRN    ondansetron (ZOFRAN-ODT) disintegrating tablet 4 mg  4 mg Oral Q8H PRN    Or    ondansetron (ZOFRAN) injection 4 mg  4 mg IntraVENous Q6H PRN    polyethylene glycol (GLYCOLAX) packet 17 g  17 g Oral Daily PRN    acetaminophen (TYLENOL) tablet 650 mg  650 mg Oral Q6H PRN    Or    acetaminophen (TYLENOL) suppository 650 mg  650 mg Rectal Q6H PRN    glucagon (rDNA) injection 1 mg  1 mg IntraMUSCular PRN    dextrose 50 % IV solution  20-30 mL IntraVENous PRN         Dragon medical dictation software was used for portions of this report. Unintended errors may occur.   [name], MD  P# [phone]  February 24, 2022

## 2022-02-24 NOTE — Progress Notes (Signed)
BiPAP Progress Note    Patient currently is Off (Pt puledl BIPAP mask off) BiPAP  Patient tolerated BiPAP for the PM shift    BiPAP QHS & PRN    BiPAP settings are:  Serial #[id number]  IPAP:  18 cmH20   EPAP: 8 cmH2O  Rate:   12  FiO2:   32 %     BiPAP will be removed from room after 48 hours of non-use

## 2022-02-24 NOTE — Care Coordination-Inpatient (Signed)
Auth to be started , email sent requesting . Patient to go to [facility].
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
## 2022-02-24 NOTE — Progress Notes (Signed)
OCCUPATIONAL THERAPY TREATMENT    Time  OT Charge Capture  Rehab Caseload Tracker  -    Patient: [name] [name] (71 y.o. female)  Room: 5231/5231    Primary Diagnosis: Acute hyperkalemia [E87.5]  Hyperglycemia [R73.9]  Septic shock (HCC) [A41.9, R65.21]  Acute respiratory failure with hypoxia and hypercapnia (HCC) [J96.01, J96.02]  Community acquired pneumonia of left lung, unspecified part of lung [J18.9]       Date of Admission: 02/13/2022   Length of Stay:  10 day(s)  Insurance: Payor: [employer] MEDICARE / Plan: [employer] GOLD PLUS HMO / Product Type: *No Product type* /      Date: 02/24/2022  Time In: 0134        Time Out: 0142       Total Minutes: 8       Isolation:  Contact       MDRO: MRSA    Precautions: falls, decreased cognition   Ordered weight bearing status: no weight bearing restrictions    Current diet order: ADULT ORAL NUTRITION SUPPLEMENT; Breakfast, Dinner; Diabetic Oral Supplement  ADULT DIET; Dysphagia - Minced and Moist; 5 carb choices (75 gm/meal)    ASSESSMENT     Based on the objective data described below, the patient presents with       -  upon therapists arrival, pt noted to be hyperventilating and stating "I feel short of breath".  Oxygen noted to be 64 on 4 L of oxygen. Therapist immediately notified RN.  Nursing increased O2 to 5 L and pt's HOB elevated and pt educated on pursed lip breathing.  O2 increased to 100 after ~3 min. Did not attempt to get pt up at this time secondary to noting pt to be very anxious and stating she still felt SOB.  Nursing further assessing pt. Pt still seemed SOB but O2 noted to be 100% before therapist left room.    - required max. A for bed repositioning at this tim    Recommendations:  Recommend continued skilled occupational therapy intervention to address above impairments.  Recommend out of bed activity to counteract ill effects of bedrest, with assistance from staff as needed.    Discharge Recommendations:   -  Skilled nursing facility (SNF):  Would  benefit to improve independence in ADLs, strength, activity tolerance and balance to ensure a successful and sustainable return to prior level of function.    Equipment Recommendations for Discharge:   -  DME needs to be determined at time of d/c from rehab     PLAN     Patient will continue to benefit from skilled OT services to attain remaining goals    COMMUNICATION/EDUCATION     Barriers to learning/limitations: Yes;  physical    Education provided [id number] on (+) role of OT, (+) OT plan of care, (+) breathing exercises    Educational handouts issued: none this session    Patient / family response to education: needs reinforcement    SUBJECTIVE     Patient "I feel short of breath."    Pain assessment: none observed    OBJECTIVE DATA SUMMARY     Orders, labs, occupational therapy and chart reviewed on [address]. Communicated with nursing staff. Patient cleared to participate in Occupational Therapy treatment.    PATIENT FOUND     Bed, noted to be hyperventilating upon therapists arrival and breathing heavily , on 4 L of oxygen, pt stating she was SOB, husband present and at bedside, (+) bed/chair exit alarm, (+)  oxygen    COGNITIVE STATUS     -  Mental status:   General observations: seemed to be very anxious  -  Communication: limited verbalization    ACTIVITES OF DAILY LIVING     -  not addressed this session    MOBILITY     - max. A for bed repositioning    Transfers:  Not addressed this session    BALANCE AND ACTIVITY TOLERANCE     Balance:  -  Not addressed this session    Vitals:   RN made aware and O2 noted to be 64 upon therapists arrival on 4 L. Oxygen increased to 5L (by RN), HOB elevated, and educated on pursed lip breathing. O2 then increased to 100% with increased time.       THERAPEUTIC EXERCISE/ACTIVITY     Therapeutic Exercises:   none this session    FINAL LOCATION     Patient positioned in bed, all needs within reach, agrees to call for assistance, (+) bed/chair exit alarm, nursing staff  notified, (+) husband present    Damita Dunnings, OTA  February 24, 2022

## 2022-02-24 NOTE — Plan of Care (Signed)
Problem: Respiratory - Adult  Goal: Achieves optimal ventilation and oxygenation  02/24/2022 0940 by Renne Crigler, RN  Outcome: Progressing  02/24/2022 0754 by Joanna Puff, RCP  Outcome: Progressing  02/24/2022 0754 by Joanna Puff, RCP  Outcome: Progressing  02/23/2022 2017 by Colman Cater, RCP  Outcome: Progressing  Flowsheets (Taken 02/23/2022 1337 by Chesley Noon, RN)  Achieves optimal ventilation and oxygenation:   Assess for changes in respiratory status   Assess for changes in mentation and behavior   Position to facilitate oxygenation and minimize respiratory effort     Problem: Discharge Planning  Goal: Discharge to home or other facility with appropriate resources  Outcome: Progressing     Problem: Safety - Adult  Goal: Free from fall injury  Outcome: Progressing     Problem: Chronic Conditions and Co-morbidities  Goal: Patient's chronic conditions and co-morbidity symptoms are monitored and maintained or improved  Outcome: Progressing     Problem: Pain  Goal: Verbalizes/displays adequate comfort level or baseline comfort level  Outcome: Progressing     Problem: Skin/Tissue Integrity  Goal: Absence of new skin breakdown  Description: 1.  Monitor for areas of redness and/or skin breakdown  2.  Assess vascular access sites hourly  3.  Every 4-6 hours minimum:  Change oxygen saturation probe site  4.  Every 4-6 hours:  If on nasal continuous positive airway pressure, respiratory therapy assess nares and determine need for appliance change or resting period.  02/24/2022 0940 by Renne Crigler, RN  Outcome: Progressing  02/24/2022 0114 by Salvatore Marvel, RN  Outcome: Progressing     Problem: ABCDS Injury Assessment  Goal: Absence of physical injury  Outcome: Progressing

## 2022-02-24 NOTE — Progress Notes (Signed)
SNF AUTH INITAITED TO [facility]  Pending Auth# [id number]  Clinicals uploaded  Will follow up shortly.

## 2022-02-24 NOTE — Progress Notes (Signed)
SPEECH- LANGUAGE PATHOLOGY NOTE   Patient: [name] [name] (71 y.o. female) 1951-01-04  Room: 5231/5231  Primary Diagnosis: Acute hyperkalemia [E87.5]  Hyperglycemia [R73.9]  Septic shock (HCC) [A41.9, R65.21]  Acute respiratory failure with hypoxia and hypercapnia (HCC) [J96.01, J96.02]  Community acquired pneumonia of left lung, unspecified part of lung [J18.9]          Date: 02/24/2022      Attempted dysphagia therapy, unable to complete 2*  working with PT   SLP will continue to follow per POC as appropriate.     Thank you,  Sandford Craze MA-CCC-SLP  Speech Pathologist   Office [phone]

## 2022-02-24 NOTE — Progress Notes (Signed)
SPEECH LANGUAGE PATHOLOGY TREATMENT     Patient: [name] [name] (71 y.o. female), August 24, 1950  Room: 5231/5231  Primary Diagnosis: Acute hyperkalemia [E87.5]  Hyperglycemia [R73.9]  Septic shock (HCC) [A41.9, R65.21]  Acute respiratory failure with hypoxia and hypercapnia (HCC) [J96.01, J96.02]  Community acquired pneumonia of left lung, unspecified part of lung [J18.9]        Date of Admission: 02/13/2022  Length of Stay: 10 day(s)    Date: 02/24/2022   Start Time:  1103 End Time:  1112    Total Minutes: 9 minutes    Isolation:  Contact        PPE: surgical mask, gloves  Precautions:  universal, aspiration, and fall      ASSESSMENT:   Orientation: Patient awake, alert  Respiratory Status: O2 via nasal cannula    Treatment: Patient seen at bedside for follow up dysphagia therapy. Patient sitting edge of bed with PT. Patient consumed thin liquids, puree, and solids without any overt clinical signs or symptoms suggestive of penetration or aspiration. Patient needing cues to slow down and take small bites/sips. Patient with impaired mastication secondary to missing dentition. Patient reports wearing dentures at home to eat. Recommend continuation of IDDIS-5 Minced and moist diet with IDDSI-0 Thin liquids as tolerated. Meds with puree. Strict aspiration precautions with one to one assist for PO and ensure that patient is sitting upright in bed when eating and not laying on left side. POC discussed with patient and RN. SLP will follow accordingly.        PLAN/ RECOMMENDATIONS:                 Diet Recommendations: IDDSI 5- minced/ moist and IDDSI 0- thin  Compensatory Swallow Strategies: HOB upright or OOB as able, HOB > 45* for all po feeds, small bites/ sip, slow rate of eating/ feeding, alternate solids/ liquids, HOB >30* at all time, meticulous oral care at least 3x/ day, basic compensatory swallow strategies   Medication Administration: crushed in puree or whole in puree   Aspiration Precautions: intermittent  supervision with meals, supervision for PO intake, 1:1 assistance, feeder, HOB >30* at all times, meticulous oral care, suction set up  Risk(s) for Aspiration: medical condition     Therapy Recommendations: Patient would benefit from continuation of SLP services for dysphagia 2-5x/ week to address goals per POC  Therapy Prognosis: fair        Discharge Recommendations: SNF  Other Recommended Services: OT, PT     SUBJECTIVE    Patient Stated: "Hi."   Pain Level: no pain reported     OBJECTIVE   Position: sitting EOB  Consistencies: thin via straw, regular and puree (IDDSI 4)  Oral Phase: prolonged mastication  Pharyngeal Phase: delayed initiation      GOALS    See POC     EDUCATION/COMMUNICATION   Education Provided: POC, deficits, and diet recommendations  Individual Educated: Patient  Comprehension: Query carryover  Barriers to learning/limitations: Yes;  cognitive  Staff Education: Educated Nurse to POC, deficits, and diet recommendations.      Safety: Following session, pt tray table, phone, and call bell within reach and EOB with PT       Thank you for this referral,  Sandford Craze MA-CCC-SLP  Speech Pathologist   Office [phone]

## 2022-02-25 LAB — COMPREHENSIVE METABOLIC PANEL
ALT: 14 U/L (ref 10–49)
AST: 17 U/L (ref 0.0–33.9)
Albumin: 3.3 gm/dl — ABNORMAL LOW (ref 3.4–5.0)
Alkaline Phosphatase: 74 U/L (ref 46–116)
Anion Gap: 9 mmol/L (ref 5–15)
BUN: 9 mg/dl (ref 9–23)
CO2: 30 mEq/L (ref 20–31)
Calcium: 9.2 mg/dl (ref 8.7–10.4)
Chloride: 98 mEq/L (ref 98–107)
Creatinine: 0.5 mg/dl — ABNORMAL LOW (ref 0.55–1.02)
GFR African American: >60
GFR Non-African American: >60
Glucose: 142 mg/dl — ABNORMAL HIGH (ref 74–106)
Potassium: 4.7 mEq/L (ref 3.5–5.1)
Sodium: 138 mEq/L (ref 136–145)
Total Bilirubin: 0.4 mg/dl (ref 0.30–1.20)
Total Protein: 7 gm/dl (ref 5.7–8.2)

## 2022-02-25 LAB — CBC WITH AUTO DIFFERENTIAL
Basophils: 0.5 % (ref 0–3)
Eosinophils: 1.1 % (ref 0–5)
Hematocrit: 38.3 % (ref 35.0–47.0)
Hemoglobin: 11.1 gm/dl (ref 11.0–16.0)
Immature Granulocytes: 0.4 % (ref 0.0–3.0)
Lymphocytes: 12.1 % — ABNORMAL LOW (ref 28–48)
MCH: 26.8 pg (ref 25.4–34.6)
MCHC: 29 gm/dl — ABNORMAL LOW (ref 30.0–36.0)
MCV: 92.5 fL (ref 80.0–98.0)
MPV: 12.8 fL — ABNORMAL HIGH (ref 6.0–10.0)
Monocytes: 8.4 % (ref 1–13)
Neutrophils Segmented: 77.5 % — ABNORMAL HIGH (ref 34–64)
Nucleated RBCs: 0 (ref 0–0)
Platelets: 148 10*3/uL (ref 140–450)
RBC: 4.14 M/uL (ref 3.60–5.20)
RDW: 61.7 — ABNORMAL HIGH (ref 36.4–46.3)
WBC: 13.4 10*3/uL — ABNORMAL HIGH (ref 4.0–11.0)

## 2022-02-25 LAB — POCT GLUCOSE
POC Glucose: 141 mg/dL — ABNORMAL HIGH (ref 65–105)
POC Glucose: 154 mg/dL — ABNORMAL HIGH (ref 65–105)
POC Glucose: 209 mg/dL — ABNORMAL HIGH (ref 65–105)
POC Glucose: 217 mg/dL — ABNORMAL HIGH (ref 65–105)

## 2022-02-25 MED ORDER — PHENYLEPHRINE HCL 0.25 % NA SOLN
0.25 % | Freq: Four times a day (QID) | NASAL | Status: DC | PRN
Start: 2022-02-25 — End: 2022-02-26
  Administered 2022-02-25: 18:00:00 2 via NASAL

## 2022-02-25 MED FILL — PIPERACILLIN SOD-TAZOBACTAM SO 3.375 (3-0.375) G IV SOLR: 3.375 (3-0.375) g | INTRAVENOUS | Qty: 3375

## 2022-02-25 MED FILL — ALBUTEROL SULFATE (2.5 MG/3ML) 0.083% IN NEBU: RESPIRATORY_TRACT | Qty: 3

## 2022-02-25 MED FILL — SODIUM CHLORIDE FLUSH 0.9 % IV SOLN: 0.9 % | INTRAVENOUS | Qty: 10

## 2022-02-25 MED FILL — LANTUS 100 UNIT/ML SC SOLN: 100 UNIT/ML | SUBCUTANEOUS | Qty: 1

## 2022-02-25 MED FILL — ROSUVASTATIN CALCIUM 10 MG PO TABS: 10 MG | ORAL | Qty: 1

## 2022-02-25 MED FILL — ELIQUIS 5 MG PO TABS: 5 MG | ORAL | Qty: 1

## 2022-02-25 MED FILL — NEO-SYNEPHRINE COLD/ALLRG MILD 0.25 % NA SOLN: 0.25 % | NASAL | Qty: 15

## 2022-02-25 MED FILL — HUMALOG 100 UNIT/ML IJ SOLN: 100 UNIT/ML | INTRAMUSCULAR | Qty: 1

## 2022-02-25 MED FILL — MAGNESIUM OXIDE -MG SUPPLEMENT 400 (240 MG) MG PO TABS: 400 (240 Mg) MG | ORAL | Qty: 1

## 2022-02-25 MED FILL — ERYTHROMYCIN 5 MG/GM OP OINT: 5 MG/GM | OPHTHALMIC | Qty: 1
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
## 2022-02-25 NOTE — Progress Notes (Signed)
02/25/22 0038   Treatment   Medications   (Pt refused neb tx)

## 2022-02-25 NOTE — Care Coordination-Inpatient (Addendum)
Transport to: [employer] complaint: unresponsiveness  Reason for transport: isolation precautions MRSA, confused, unable to ambulate, O2@4L   Transport set up with: [facility]  Time/Date: 02/26/22@Will  Call  D/C Summary loaded: pending  Nurse/CM notified: yes  Envelope delivered: yes  Insurance verified on face sheet: yes  Auth needed: no  Auth #:

## 2022-02-25 NOTE — Progress Notes (Signed)
BiPAP Progress Note    Patient currently is Off (Pt puledl BIPAP mask off) BiPAP  Patient refused BiPAP for the PM shift    BiPAP QHS & PRN    BiPAP settings are:  Serial #[id number]  IPAP:  18 cmH20   EPAP: 8 cmH2O  Rate:   12  FiO2:   32 %     BiPAP will be removed from room after 48 hours of non-use

## 2022-02-25 NOTE — Care Coordination-Inpatient (Signed)
Per MD patient discharge on hold until tomorrow. Transport changed to will call. Facility and [facility] aware .

## 2022-02-25 NOTE — Progress Notes (Signed)
1205. Patient had epistaxis from left nostril. Pressure held to nose for 40 minutes. Nose still bleeding at this time Notified Dr. Genene Churn.     1610. Patient still bleeding from left nostril. Per Dr. Genene Churn to hold discharge to tomorrow if nose is still bleeding.

## 2022-02-25 NOTE — Progress Notes (Signed)
Progress Note    Patient: [name] [name]   Age:  71 y.o.  DOA: 02/13/2022   Admit Dx / CC:   LOS:  LOS: 11 days     Active Problems   1.  Septic shock likely due to pneumonia improving  2.  Acute hypoxic and hypercapnic respiratory failure  3.  Hyperkalemia  4.  COPD with acute exacerbation  5.  DM type II controlled   6.  History of DVT  7.  Hypertension  8.  Macrocytic anemia likely iron deficiency  9.  Hypokalemia  10.  Hypomagnesemia  11.  Hypophosphatemia  12.  Clinical depression  13.  Epistaxis  Plan:   1.  Patient was seen and examined this morning patient was feeling better she was awake alert able to feed herself.  Patient was discussed with discharge planner.  Patient was approved due to go to skilled nursing facility but this afternoon patient started epistaxis  Patient is on Eliquis which was placed on hold.  Afrin nasal spray was ordered if bleeding does not stop patient may need nasal packing.  Discharge was canceled till tomorrow.  Meanwhile continue rest of the treatment  Subjective:   Feeling much better today  Objective:   Visit Vitals  BP 128/84   Pulse 77   Temp 97.3 F (36.3 C) (Temporal)   Resp 18   Ht 5\' 7"  (1.702 m)   Wt 215 lb 6.2 oz (97.7 kg)   SpO2 100%   BMI 33.73 kg/m       Physical Exam:  General appearance: Very pleasant elderly female sitting comfortably eating breakfast  Neurologic: Awake and alert  Answering questions appropriately    Intake and Output:  Current Shift:  09/15 0701 - 09/15 1900  In: 380 [P.O.:380]  Out: 800 [Urine:800]  Last three shifts:  09/13 1901 - 09/15 0700  In: 490 [P.O.:480; I.V.:10]  Out: 3300 [Urine:3300]    Lab/Data Reviewed:  Recent Results (from the past 48 hour(s))   POCT Glucose    Collection Time: 02/23/22  8:45 PM   Result Value Ref Range    POC Glucose 147 (H) 65 - 105 mg/dL   CBC with Auto Differential    Collection Time: 02/24/22  6:10 AM   Result Value Ref Range    WBC 14.2 (H) 4.0 - 11.0 1000/mm3    RBC 3.52 (L) 3.60 - 5.20 M/uL    Hemoglobin  9.4 (L) 11.0 - 16.0 gm/dl    Hematocrit 02/26/22 (L) 35.0 - 47.0 %    MCV 90.3 80.0 - 98.0 fL    MCH 26.7 25.4 - 34.6 pg    MCHC 29.6 (L) 30.0 - 36.0 gm/dl    Platelets 86.7 544 - 450 1000/mm3    MPV 12.1 (H) 6.0 - 10.0 fL    RDW 59.7 (H) 36.4 - 46.3      Nucleated RBCs 0 0 - 0      Immature Granulocytes 0.3 0.0 - 3.0 %    Neutrophils Segmented 78.0 (H) 34 - 64 %    Lymphocytes 11.5 (L) 28 - 48 %    Monocytes 8.6 1 - 13 %    Eosinophils 1.3 0 - 5 %    Basophils 0.3 0 - 3 %   Comprehensive Metabolic Panel    Collection Time: 02/24/22  6:10 AM   Result Value Ref Range    Potassium 4.2 3.5 - 5.1 mEq/L    Chloride 100 98 - 107  mEq/L    Sodium 141 136 - 145 mEq/L    CO2 37 (H) 20 - 31 mEq/L    Glucose 150 (H) 74 - 106 mg/dl    BUN 17 9 - 23 mg/dl    Creatinine 9.51 (L) 0.55 - 1.02 mg/dl    GFR African American >60.0      GFR Non-African American >60      Calcium 9.2 8.7 - 10.4 mg/dl    Anion Gap 4 (L) 5 - 15 mmol/L    AST 12.0 0.0 - 33.9 U/L    ALT 9 (L) 10 - 49 U/L    Alkaline Phosphatase 67 46 - 116 U/L    Total Bilirubin 0.40 0.30 - 1.20 mg/dl    Total Protein 6.3 5.7 - 8.2 gm/dl    Albumin 2.9 (L) 3.4 - 5.0 gm/dl   POCT Glucose    Collection Time: 02/24/22  7:21 AM   Result Value Ref Range    POC Glucose 148 (H) 65 - 105 mg/dL   POCT Glucose    Collection Time: 02/24/22 11:18 AM   Result Value Ref Range    POC Glucose 150 (H) 65 - 105 mg/dL   POCT Glucose    Collection Time: 02/24/22  5:03 PM   Result Value Ref Range    POC Glucose 144 (H) 65 - 105 mg/dL   POCT Glucose    Collection Time: 02/24/22  8:30 PM   Result Value Ref Range    POC Glucose 141 (H) 65 - 105 mg/dL   CBC with Auto Differential    Collection Time: 02/25/22  7:42 AM   Result Value Ref Range    WBC 13.4 (H) 4.0 - 11.0 1000/mm3    RBC 4.14 3.60 - 5.20 M/uL    Hemoglobin 11.1 11.0 - 16.0 gm/dl    Hematocrit 88.4 16.6 - 47.0 %    MCV 92.5 80.0 - 98.0 fL    MCH 26.8 25.4 - 34.6 pg    MCHC 29.0 (L) 30.0 - 36.0 gm/dl    Platelets 063 016 - 450 1000/mm3    MPV  12.8 (H) 6.0 - 10.0 fL    RDW 61.7 (H) 36.4 - 46.3      Nucleated RBCs 0 0 - 0      Immature Granulocytes 0.4 0.0 - 3.0 %    Neutrophils Segmented 77.5 (H) 34 - 64 %    Lymphocytes 12.1 (L) 28 - 48 %    Monocytes 8.4 1 - 13 %    Eosinophils 1.1 0 - 5 %    Basophils 0.5 0 - 3 %   Comprehensive Metabolic Panel    Collection Time: 02/25/22  7:42 AM   Result Value Ref Range    Potassium 4.7 3.5 - 5.1 mEq/L    Chloride 98 98 - 107 mEq/L    Sodium 138 136 - 145 mEq/L    CO2 30 20 - 31 mEq/L    Glucose 142 (H) 74 - 106 mg/dl    BUN 9 9 - 23 mg/dl    Creatinine 0.10 (L) 0.55 - 1.02 mg/dl    GFR African American >60.0      GFR Non-African American >60      Calcium 9.2 8.7 - 10.4 mg/dl    Anion Gap 9 5 - 15 mmol/L    AST 17.0 0.0 - 33.9 U/L    ALT 14 10 - 49 U/L    Alkaline Phosphatase 74 46 - 116  U/L    Total Bilirubin 0.40 0.30 - 1.20 mg/dl    Total Protein 7.0 5.7 - 8.2 gm/dl    Albumin 3.3 (L) 3.4 - 5.0 gm/dl   POCT Glucose    Collection Time: 02/25/22  7:50 AM   Result Value Ref Range    POC Glucose 154 (H) 65 - 105 mg/dL   POCT Glucose    Collection Time: 02/25/22 11:41 AM   Result Value Ref Range    POC Glucose 209 (H) 65 - 105 mg/dL       Imaging:  [email or web address] @    Medications Reviewed:  Current Facility-Administered Medications   Medication Dose Route Frequency    phenylephrine (NEO-SYNEPHRINE) 0.25 % nasal spray 2 spray  2 spray Nasal Q6H PRN    buPROPion (WELLBUTRIN XL) extended release tablet 150 mg  150 mg Oral Daily    albuterol (PROVENTIL) (2.5 MG/3ML) 0.083% nebulizer solution 2.5 mg  2.5 mg Nebulization Q6H    guaiFENesin (ROBITUSSIN) 100 MG/5ML liquid 400 mg  400 mg Oral TID    metoprolol succinate (TOPROL XL) extended release tablet 50 mg  50 mg Oral Daily    pantoprazole (PROTONIX) tablet 40 mg  40 mg Oral QAM AC    spironolactone (ALDACTONE) tablet 25 mg  25 mg Oral Daily    potassium chloride 20 mEq/50 mL IVPB (Central Line)  20 mEq IntraVENous PRN    Or    potassium chloride 10 mEq/100 mL IVPB (Peripheral  Line)  10 mEq IntraVENous PRN    magnesium sulfate 2000 mg in 50 mL IVPB premix  2,000 mg IntraVENous PRN    magnesium oxide (MAG-OX) tablet 400 mg  400 mg Oral BID    potassium chloride 20 mEq in 100 mL IVPB  20 mEq IntraVENous Once    [Held by provider] apixaban (ELIQUIS) tablet 5 mg  5 mg Oral BID    amLODIPine (NORVASC) tablet 10 mg  10 mg Oral Daily    losartan (COZAAR) tablet 100 mg  100 mg Oral Daily    sodium hypochlorite (DAKINS) 0.125 % external solution   Topical Daily    fluconazole (DIFLUCAN) in 0.9 % sodium chloride IVPB 400 mg  400 mg IntraVENous Q24H    silver sulfADIAZINE (SILVADENE) 1 % cream   Topical BID    erythromycin (ROMYCIN) ophthalmic ointment   Both Eyes 3 times per day    prochlorperazine (COMPAZINE) injection 10 mg  10 mg IntraVENous Q6H PRN    insulin lispro (HUMALOG) injection vial 1-100 Units  1-100 Units SubCUTAneous 4x Daily AC & HS    sodium chloride flush 0.9 % injection 5-40 mL  5-40 mL IntraVENous BID    fluticasone furoate-vilanterol (BREO ELLIPTA) 100-25 MCG/ACT inhaler 1 puff  1 puff Inhalation Daily    montelukast (SINGULAIR) tablet 10 mg  10 mg Oral Daily    rosuvastatin (CRESTOR) tablet 5 mg  5 mg Oral Nightly    piperacillin-tazobactam (ZOSYN) 3,375 mg in sodium chloride 0.9 % 100 mL IVPB (mini-bag)  3,375 mg IntraVENous Q8H    insulin glargine (LANTUS) injection vial 1-100 Units  1-100 Units SubCUTAneous QHS    sodium chloride flush 0.9 % injection 5-40 mL  5-40 mL IntraVENous PRN    0.9 % sodium chloride infusion   IntraVENous PRN    ondansetron (ZOFRAN-ODT) disintegrating tablet 4 mg  4 mg Oral Q8H PRN    Or    ondansetron (ZOFRAN) injection 4 mg  4 mg IntraVENous Q6H PRN  polyethylene glycol (GLYCOLAX) packet 17 g  17 g Oral Daily PRN    acetaminophen (TYLENOL) tablet 650 mg  650 mg Oral Q6H PRN    Or    acetaminophen (TYLENOL) suppository 650 mg  650 mg Rectal Q6H PRN    glucagon (rDNA) injection 1 mg  1 mg IntraMUSCular PRN    dextrose 50 % IV solution  20-30 mL  IntraVENous PRN         Dragon medical dictation software was used for portions of this report. Unintended errors may occur.   Hazel SamsAHIR Madai Nuccio, MD  P# [phone]  February 25, 2022

## 2022-02-25 NOTE — Plan of Care (Signed)
Problem: Respiratory - Adult  Goal: Achieves optimal ventilation and oxygenation  Outcome: Progressing     Problem: Discharge Planning  Goal: Discharge to home or other facility with appropriate resources  Outcome: Progressing     Problem: Safety - Adult  Goal: Free from fall injury  Outcome: Progressing     Problem: Chronic Conditions and Co-morbidities  Goal: Patient's chronic conditions and co-morbidity symptoms are monitored and maintained or improved  Outcome: Progressing     Problem: Pain  Goal: Verbalizes/displays adequate comfort level or baseline comfort level  Outcome: Progressing     Problem: Skin/Tissue Integrity  Goal: Absence of new skin breakdown  Description: 1.  Monitor for areas of redness and/or skin breakdown  2.  Assess vascular access sites hourly  3.  Every 4-6 hours minimum:  Change oxygen saturation probe site  4.  Every 4-6 hours:  If on nasal continuous positive airway pressure, respiratory therapy assess nares and determine need for appliance change or resting period.  Outcome: Progressing     Problem: ABCDS Injury Assessment  Goal: Absence of physical injury  Outcome: Progressing

## 2022-02-25 NOTE — Plan of Care (Signed)
Problem: Respiratory - Adult  Goal: Achieves optimal ventilation and oxygenation  Outcome: Progressing

## 2022-02-25 NOTE — Progress Notes (Signed)
SNF AUTH APPROVED TO [facility] and [place]  DTOI#[id number]  NaviRef#: [id number]  Approval Dates: 9/15-9/19  Care Coordinator: Harrell Lark  Continued Stay Review Fax: [phone]  Phone: [phone]

## 2022-02-25 NOTE — Care Coordination-Inpatient (Addendum)
Discharge Plan:   SNF , per MD he will check on the nose bleed that she had this am , if ok patient will be discharged     Discharge Date:     02/25/2022     Assisted Living Facility:    n/a    The facility confirmed that they are ready to receive the patient:     Yes   the CM spoke with     Javanti    Is patient going to snf? Yes  name  [facility]    Any other equipment needs at snf? Oxygen, wc walker    FOC given: yes     TCC Referral:     no    Medication Assistance given    N       Transportation: Medical      Transport Time:    81    Family, MD, patient, nurse and facility aware of pickup time    yes       Call out to spouse he is aware and updated on DC status.    SN to call [phone] to give report  , If patient not able to go today please call and Cancell fast track ambulance

## 2022-02-25 NOTE — Care Coordination-Inpatient (Addendum)
78 Certification Medical Necessity Statement For  Non-Emergency Ambulance Service    Patient: [name] [name] Age: 71 y.o. Sex: female    Date of Birth: January 09, 1951 Admit Date: 02/13/2022 PCP: Ernst Breach   MRN: [id number]  CSN: [id number]  Room: 5231/5231     Chief Complaint:   Chief Complaint   Patient presents with    Respiratory Distress      Height/Weight:  Body mass index is 33.73 kg/m. Body mass index is 33.73 kg/m.   Weight:     Weight - Scale: 215 lb 6.2 oz (97.7 kg)  Weight Source:      Transport From:  '[x]' Hospital    '[]' SNF   '[]' Residence   '[]' Other:    Transport To:      '[]' Hospital    '[x]' SNF   '[]' Residence   '[]' Other:    [facility]     Medicare: [place]  Transport Date: February 26, 2022   Origin: [facility]  Destination: SNF [facility] [place] , [place]     Is the Patient's stay covered under Medicare Part A (PPS/DRG?)   '[x]'  Yes    '[]'  No     Closest appropriate facility?   '[]'  Yes    '[]'  No     If no, why was the patient transported to another facility?    If hospital to hospital transfer, describe services needed at 2nd facility not available at 1st facility:    If hospice Pt, is this transport related to Pt's terminal illness?  '[]'  Yes    '[]'  No   Describe:      [place]  Ambulance transportation is medically necessary only if other means of transport are contraindicated or would be potentially   Harmful to the patient. To meet this requirement, the patient must be either "bed confined" or suffer from condition such   that transport by means other than an ambulance is contraindicated by the patient's condition.The following questions   must be addressed by the healthcare professional signing below for this form to be valid:    1) Describe the MEDICAL CONDITION space (physical and/or mental) of this patient AT [place]       that requires the patient to be transported in an  ambulance, and why transport in an ambulance, and why transport by       other means is contraindicated by the patient's condition:      2) Is this patient "bed confined" as defined below?   '[x]'  Yes    '[]'  No           To be "bed confined" the patient must satisfy all 3 of the following criteria:           (1) unable to get up from bed without assistance; AND           (2) unable to ambulate; AND           (3) unable to sit in a chair or wheelchair.    3) Can this patient safely be transported by car or wheelchair van (I.e., may safely sit during transport, without an attendant or monitoring?)      '[]'  Yes    '[x]'  No    4) In addition to completing questions 1-3 above, please check any of the following conditions that apply*:       *Note: Supporting documentation for  any boxes checked must be maintained in the patient's medical records    Patient requires ambulance transportation due to the following condition:  '[]' Airway control or positioning required en route  '[]' Altered Mental Status, Etiology:    '[]' Amputation of lower extremity, Site:     (BKA / AKA / Other)  '[]' Asphyxia or Hypoxemia, Etiology:    '[]' Cancer, Site:    '[]' Cardiac or Hemodynamic Monitoring required en route  '[]' Cerebrovascular disease, WITH: Cognitive Defects  '[]' Cerebrovascular disease, WITH: Hemiparesis / Hemiplegia  '[]' Cerebrovascular disease, WITH: Monoplegia of a lower limb  '[]' Chest wall injury  '[]' Contractures of extremities, Site:    '[]' Decubitus ulcer, Site:    '[]' Fracture, Site:    '[]' Head Injury  '[]' IV Fluid Management, Medications being administered:    '[]' PIV:   '[]'  Yes    '[]'  No  '[x]' Oxygen required, patient cannot self-administer, Reason:  4LPM  '[]' Oxygen requiring titrated therapy en route  '[]' Patient Safety: Danger to self or others - flight risk / monitoring  '[x]' Patient Safety: Risk of falling off wheelchair or stretcher while in motion  '[]' Restraints required:  '[]' Verbal      '[]' Physical    '[]' Chemical  '[x]' Special handling en route to reduce  pain  '[x]' Special handling en route for patient positioning  '[]' Special handling en route for Isolation, Type:      Diagnosis:    '[]' Suctioning required en route  '[]' Torso or Trunk injury  '[]' Ventilator Dependent  '[]' Other    SECTION III - SIGNATURE OF PHYSICIAN OR OTHER AUTHORIZED HEALTHCARE PROFESSIONAL  I certify that the above information is accurate based on my evaluation of this patient, and that the medical necessity provisions   of 42 CFR 410.40 (e)(1) are met, requiring that this patient be transported by ambulance.  I understand this information will be used by   the centers for Medicare and Medicaid Services (CMS) to support the determination of medical necessity for ambulance services.  I represent   that I am the beneficiary's attending physician; or an employee of the beneficiary's attending physician, or the hospital or facility where the   beneficiary is being treated and from which the beneficiary is being transported; that I have personal knowledge of the beneficiary's   condition at the time of transport; and that I meet all Medicare regulations and applicable state licensure laws for the credentialed indicated.     '[x]' If this box is checked, I also certify that the patient is physically or mentally incapable of signing the ambulance services claim form and   that the institution with which I am affiliated has furnished care, services or assistance to the patient.  My signature below was made on behalf   of the patient pursuant to 48 YBO175.10(C)(5).  In accordance with 42 [id number], the specific reason(s) that the patient is physically   or mentally incapable of signing the claim form is as follows.    Electronically signed by:  Malachi Carl  February 25, 2022  2:59 PM    Adella Nissen, RN CM -Addendum  to date February 26, 2022     Name: _________________________Title: ______________________  Facility: [facility]  Phone Number: [phone]  Date: February 25, 2022   Adella Nissen, RN CM -Addendum  to date  February 26, 2022     Please call to arrange transportation: [phone]  [email or web address]

## 2022-02-26 LAB — CBC WITH AUTO DIFFERENTIAL
Basophils: 0.2 % (ref 0–3)
Eosinophils: 1.1 % (ref 0–5)
Hematocrit: 31.7 % — ABNORMAL LOW (ref 35.0–47.0)
Hemoglobin: 9.6 gm/dl — ABNORMAL LOW (ref 11.0–16.0)
Immature Granulocytes: 0.5 % (ref 0.0–3.0)
Lymphocytes: 7.6 % — ABNORMAL LOW (ref 28–48)
MCH: 27.3 pg (ref 25.4–34.6)
MCHC: 30.3 gm/dl (ref 30.0–36.0)
MCV: 90.1 fL (ref 80.0–98.0)
MPV: 11.8 fL — ABNORMAL HIGH (ref 6.0–10.0)
Monocytes: 6.1 % (ref 1–13)
Neutrophils Segmented: 84.5 % — ABNORMAL HIGH (ref 34–64)
Nucleated RBCs: 0 (ref 0–0)
Platelets: 162 10*3/uL (ref 140–450)
RBC: 3.52 M/uL — ABNORMAL LOW (ref 3.60–5.20)
RDW: 58.8 — ABNORMAL HIGH (ref 36.4–46.3)
WBC: 17.1 10*3/uL — ABNORMAL HIGH (ref 4.0–11.0)

## 2022-02-26 LAB — COMPREHENSIVE METABOLIC PANEL
ALT: 11 U/L (ref 10–49)
AST: 11 U/L (ref 0.0–33.9)
Albumin: 3.1 gm/dl — ABNORMAL LOW (ref 3.4–5.0)
Alkaline Phosphatase: 76 U/L (ref 46–116)
Anion Gap: 7 mmol/L (ref 5–15)
BUN: 17 mg/dl (ref 9–23)
CO2: 34 mEq/L — ABNORMAL HIGH (ref 20–31)
Calcium: 9 mg/dl (ref 8.7–10.4)
Chloride: 97 mEq/L — ABNORMAL LOW (ref 98–107)
Creatinine: 0.56 mg/dl (ref 0.55–1.02)
GFR African American: >60
GFR Non-African American: >60
Glucose: 189 mg/dl — ABNORMAL HIGH (ref 74–106)
Potassium: 4.3 mEq/L (ref 3.5–5.1)
Sodium: 139 mEq/L (ref 136–145)
Total Bilirubin: 0.3 mg/dl (ref 0.30–1.20)
Total Protein: 6.2 gm/dl (ref 5.7–8.2)

## 2022-02-26 LAB — POCT GLUCOSE
POC Glucose: 178 mg/dL — ABNORMAL HIGH (ref 65–105)
POC Glucose: 121 mg/dL — ABNORMAL HIGH (ref 65–105)
POC Glucose: 233 mg/dL — ABNORMAL HIGH (ref 65–105)

## 2022-02-26 MED ORDER — LANTUS SOLOSTAR 100 UNIT/ML SC SOPN
100 UNIT/ML | Freq: Every evening | SUBCUTANEOUS | 3 refills | Status: AC
Start: 2022-02-26 — End: ?

## 2022-02-26 MED ORDER — METOPROLOL SUCCINATE ER 50 MG PO TB24
50 MG | ORAL_TABLET | Freq: Every day | ORAL | 3 refills | Status: AC
Start: 2022-02-26 — End: ?

## 2022-02-26 MED ORDER — BUPROPION HCL ER (XL) 150 MG PO TB24
150 MG | ORAL_TABLET | Freq: Every morning | ORAL | 3 refills | Status: AC
Start: 2022-02-26 — End: ?

## 2022-02-26 MED ORDER — INSULIN ASPART 100 UNIT/ML IJ SOLN
100 UNIT/ML | Freq: Three times a day (TID) | INTRAMUSCULAR | 3 refills | Status: AC
Start: 2022-02-26 — End: ?

## 2022-02-26 MED ORDER — QUETIAPINE FUMARATE ER 50 MG PO TB24
50 MG | ORAL_TABLET | Freq: Every evening | ORAL | 0 refills | Status: AC
Start: 2022-02-26 — End: ?

## 2022-02-26 MED ORDER — POLYETHYLENE GLYCOL 3350 17 G PO PACK
17 g | Freq: Every day | ORAL | 0 refills | Status: AC | PRN
Start: 2022-02-26 — End: 2022-03-28

## 2022-02-26 MED ORDER — SPIRONOLACTONE 25 MG PO TABS
25 MG | ORAL_TABLET | Freq: Every day | ORAL | 3 refills | Status: AC
Start: 2022-02-26 — End: ?

## 2022-02-26 MED ORDER — SILVER SULFADIAZINE 1 % EX CREA
1 % | CUTANEOUS | 0 refills | Status: AC
Start: 2022-02-26 — End: ?

## 2022-02-26 MED ORDER — PHENYLEPHRINE HCL 0.25 % NA SOLN
0.25 % | Freq: Four times a day (QID) | NASAL | 0 refills | Status: DC | PRN
Start: 2022-02-26 — End: 2022-03-01

## 2022-02-26 MED ORDER — MAGNESIUM OXIDE -MG SUPPLEMENT 400 (240 MG) MG PO TABS
400 (240 Mg) MG | ORAL_TABLET | Freq: Every day | ORAL | 0 refills | Status: AC
Start: 2022-02-26 — End: ?

## 2022-02-26 NOTE — Progress Notes (Signed)
[name] [name], transferred to unit 3, report given to [facility].

## 2022-02-26 NOTE — Plan of Care (Signed)
Problem: Respiratory - Adult  Goal: Achieves optimal ventilation and oxygenation  Outcome: Completed     Problem: Discharge Planning  Goal: Discharge to home or other facility with appropriate resources  Outcome: Completed     Problem: Safety - Adult  Goal: Free from fall injury  Outcome: Completed     Problem: Chronic Conditions and Co-morbidities  Goal: Patient's chronic conditions and co-morbidity symptoms are monitored and maintained or improved  Outcome: Completed     Problem: Pain  Goal: Verbalizes/displays adequate comfort level or baseline comfort level  Outcome: Completed     Problem: Skin/Tissue Integrity  Goal: Absence of new skin breakdown  Description: 1.  Monitor for areas of redness and/or skin breakdown  2.  Assess vascular access sites hourly  3.  Every 4-6 hours minimum:  Change oxygen saturation probe site  4.  Every 4-6 hours:  If on nasal continuous positive airway pressure, respiratory therapy assess nares and determine need for appliance change or resting period.  Outcome: Completed     Problem: ABCDS Injury Assessment  Goal: Absence of physical injury  Outcome: Completed

## 2022-02-26 NOTE — Progress Notes (Signed)
SPEECH LANGUAGE PATHOLOGY TREATMENT     Patient: [name] [name] (71 y.o. female), [birth year]  Room: 5231/5231  Primary Diagnosis: Acute hyperkalemia [E87.5]  Hyperglycemia [R73.9]  Septic shock (HCC) [A41.9, R65.21]  Acute respiratory failure with hypoxia and hypercapnia (HCC) [J96.01, J96.02]  Community acquired pneumonia of left lung, unspecified part of lung [J18.9]        Date of Admission: 02/13/2022  Length of Stay: 12 day(s)    Date: 02/26/2022   Start Time:  11:10 End Time:  11:30    Total Minutes: 20 minutes    Isolation:  Contact        PPE: universal  Precautions:  universal      ASSESSMENT:   Orientation: Patient awake, alert, pleasant  Respiratory Status: 1.5 liters O2 via nasal cannula    Treatment: SLP f/u with Dr. Genene Churn who states pt is doing extremely well and is being discharged to a SNF this afternoon. SLP saw pt while she was laying on her left side in bed having just had her blood sugars taken.  Pt's husband was present.  Due to pt's blood sugar level and the fact that she had just finished eating, Mr. Elster requested no additional p.o. trials at this time.  SLP discussed with pt and her husband concerns regarding her dentures, which Mr. Boehringer reports he brought in today.  SLP also reviewed with pt her safe swallow strategies.       PLAN/ RECOMMENDATIONS:                 Diet Recommendations: IDDSI 5- minced/ moist  Compensatory Swallow Strategies: HOB ~60* for all po feeds and >45* at least 30 minutes following   Medication Administration: as tolerated   Aspiration Precautions: supervision for PO intake, provide verbal cues for compensatory swallow strategies  Risk(s) for Aspiration: medical condition     Therapy Recommendations: Patient would benefit from continuation of SLP services for dysphagia 2-5x/ week to address goals per POC  Therapy Prognosis: good      Discharge Recommendations: continue services upon discharge  Other Recommended Services: OT, PT     SUBJECTIVE    Patient Stated: "I'm  tired."   Pain Level: no pain reported     OBJECTIVE   Position: HOB ~60*  Exercises:  Safe swallow strategies were reviewed with patient.  These include:  proper positioning for p.o. acceptance, small bites/small sips, slow rate of intake and alternating solids and liquids throughout the meal. Pt and Mr. Ellwood stated their understanding and agreement.    GOALS    See POC     EDUCATION/COMMUNICATION   Education Provided: POC, deficits, diet recommendations, and aspiration precautions  Individual Educated: Patient and Family/Caregiver  Comprehension: Patient demonstrated understanding, In agreement, and Family/Caregiver communicated comprehension  Barriers to learning/limitations: Yes;  lethargy  Staff Education: Educated MD and Nurse to POC, deficits, and diet recommendations.      Safety: Following session, pt tray table, phone, and call bell within reach       Thank you for this referral,  Riccardo Dubin, MS Ed Glass blower/designer  Office:  [place] or secure chat

## 2022-02-26 NOTE — Care Coordination-Inpatient (Signed)
Discharge Plan:   [facility]     Discharge Date:     02/26/2022     The facility confirmed that they are ready to receive the patient:   Yes and CM confirmed with Blanchie Dessert     Transportation: Medical  - [place] spoke with Kathlee Nations   [phone]    Transport Time:    1600-1630    Patient, Patients Husband at bedside, MD, Kaleen Odea, [place]  and J. Henry aware of D/C plan

## 2022-02-26 NOTE — Progress Notes (Signed)
Remote note.  Chart reviewed. Notes, labs and imaging reviewed as needed.    -Patient afebrile, stable vitals, on nasal cannula  -No new labs today    -9/3 blood cultures positive for E faecalis and Candida albicans  -Suspect source to be urine as positive and with multiple organisms including these 2  -9/4 and 9/5 blood cultures negative   -2D echo negative for any valve vegetation  -Fungal markers are negative  -Retroperitoneal ultrasound with medical renal disease and renal cyst  -Patient on day #13 antibiotics and antifungals with Zosyn and fluconazole  -Stop tomorrow or at discharge today-which ever is sooner    -Patient developed epistaxis yesterday which delayed his discharge     -Patient with complex lung disease and effusion with atelectasis on left side   -Previous thoracentesis with negative results     -Monitor labs as needed  -Monitor for any ADE  -Discussed with Dr. Genene Churn    Please call me if you have any question or concern.We will check back on Monday.  Thank you.    Tobin Chad, MD  February 26, 2022    [facility] Infectious Disease   Office [place]  [phone]
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
## 2022-02-28 ENCOUNTER — Inpatient Hospital Stay: Admit: 2022-02-28 | Discharge: 2022-03-13 | Disposition: E | Payer: MEDICARE | Attending: Emergency Medicine

## 2022-02-28 DIAGNOSIS — I469 Cardiac arrest, cause unspecified: Secondary | ICD-10-CM

## 2022-02-28 LAB — POCT GLUCOSE: POC Glucose: 283 mg/dL — ABNORMAL HIGH (ref 65–105)

## 2022-02-28 MED ORDER — EPINEPHRINE 1 MG/10ML IJ SOSY
1 MG/0ML | Freq: Every day | INTRAMUSCULAR | Status: AC | PRN
Start: 2022-02-28 — End: 2022-02-28
  Administered 2022-02-28 (×4): 1 via INTRAVENOUS

## 2022-02-28 NOTE — Code Documentation (Signed)
1459: cpr in progress, epi drip going  1450: epi given pulse check, no cardiac activbity  1453: pulse check- no pulse resume CPR, epi given

## 2022-02-28 NOTE — Progress Notes (Signed)
Chaplain Services   Crisis/Death note    Start Time: 1962  End Time: 1509    Chaplain responded to Crisis/Death for  [employer], who was a 71 y.o.,female,     Service Provided For:: Patient  and the following Interventions      Type: Code (comment)      Chaplain offered prayer on behalf of patient prior to death, and final prayer on patient's behalf at death and for the comfort of the family.  There was no family present.               The following outcomes were achieved:       Assessment:  There are no spiritual or religious issues which require intervention at this time.     Plan:  Chaplains will Plan/Referrals: Continue Support (comment)    Time Spent: Total Time Calculated: 13 min      [place]  [facility]  [phone]

## 2022-02-28 NOTE — ED Notes (Addendum)
I, Dr. Sedonia Small, have personally seen and examined this patient; I have fully participated in the care of this patient with the resident.  I have reviewed and agree with all pertinent clinical information including history, physical exam, labs, radiographic studies and the plan.  I have also reviewed and agree with the medications, allergies and past medical history sections for this patient.  I provided a substantive portion of the care of this patient.  I personally performed the medical decision making in its entirety, for this encounter. Patient presents with complaints of cardiopulmonary arrest, arrived via EMS, patient had agonal respirations occasionally during the resuscitative effort therefore the decision to transport the patient to the emergency department was made.  Patient arrived with epi infusion still in place.  Initial rhythm was asystole.  Patient had resuscitative efforts continued in the emergency department the i-gel will be moved and patient was intubated by myself with easy cap confirmation and bilateral breath sounds following intubation.  Rhythm remaining unchanged on the monitor, bedside ultrasound performed by myself revealing no cardiac activity and resuscitative efforts were stopped with patient being pronounced dead at 1503.  Patient's husband notified.    Joice Lofts, MD     Joice Lofts, MD  March 05, 2022 [place] Romey Cohea, MD  03/05/2022 [phone]

## 2022-02-28 NOTE — ED Notes (Signed)
Brought in by EMS from [facility], CPR in progress.   Pt. Was witnessed by staff vomiting and then went into cardiac arrest. Staff initiated CPR. IV epi push then epi drip started by EMS. Patient had PEA, agonal breathing at 24minutes.   Patient transported via , PEA upon arrival.      Thomos Lemons, RN  2022-03-17 [phone]
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
## 2022-02-28 NOTE — ED Provider Notes (Signed)
Emergency Department Treatment Report    Patient: [name] [name] Age: 71 y.o. Sex: female    Date of Birth: 1950/11/03 Admit Date: 25-Mar-2022 PCP: [name] Huh, PA-C   MRN: [id number]  CSN: [id number]     Room: ER26/ER26 Time Dictated: 5:15 PM        Chief Complaint   Chief Complaint   Patient presents with    Cardiac Arrest       History of Present Illness   [name] [name] is a 71 y.o. female with past medical history of hypertension, uncontrolled type 2 diabetes, COPD, PAD, asthma, cannabinoid hyperemesis syndrome, acute congestive heart failure, and thoracic ascending aortic aneurysm who presents to the ED via EMS for cardiac arrest with CPR in process.  Prior to presenting to the emergency department she had been at her rehab facility after recent admission for respiratory failure, she was found down by the staff who started CPR, she received a total of 30 minutes of CPR on scene, initially found in asystole that had transition to PEA, with accelerated junctional rhythm, and had agonal breathing.  They placed an IO and an LMA.    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    DM2 (diabetes mellitus, type 2) ([facility])     GERD (gastroesophageal reflux disease)     Hepatitis C     HEP C    HTN (hypertension)     Lung nodule, multiple     (CT 10/2016) Small left upper lobe and 1.7 x 1.6 cm left lower lobe nodules not significantly changed from 07/23/2016 and 03/22/2016    Marijuana use     PTSD (post-traumatic stress disorder)     lived through the [place] in 1993    Thoracic ascending aortic aneurysm     4.4cm noted on CT Chest (10/2016)    Thyroid nodule     2.5 cm stable right thyroid nodule (CT 10/2016)     Past Surgical History:   Procedure  Laterality Date    CT GUIDED CHEST TUBE  08/28/2018    CT GUIDED CHEST TUBE 08/28/2018 [facility]    HERNIA REPAIR  2019    x2    HYSTERECTOMY (CERVIX STATUS UNKNOWN)  2010    hysterectomy    TOTAL HIP ARTHROPLASTY      TOTAL HIP ARTHROPLASTY Right 01/29/2015    UPPER GASTROINTESTINAL ENDOSCOPY         Social History     Social History     Socioeconomic History    Marital status: Married   Tobacco Use    Smoking status: Some Days    Smokeless tobacco: Never    Tobacco comments:     Quit smoking: reported as quit smoking around 1990s per spouse   Substance and Sexual Activity    Alcohol use: No    Drug use: Yes     Types: Marijuana Sherrie Mustache)       Family History     Family History   Problem Relation Age of Onset    Alcohol Abuse Maternal Grandfather  Hypertension Mother     Other Mother         OSA    Cancer Father         suspected leukemia per pt's spouse    Cancer Maternal Aunt        Current Medications     Previous Medications    ACETAMINOPHEN (TYLENOL) 500 MG TABLET    Take 1 tablet by mouth 2 times daily as needed    ALBUTEROL (ACCUNEB) 1.25 MG/3ML NEBULIZER SOLUTION    USE 1 VIAL IN NEBULIZER EVERY 6 HOURS AS NEEDED FOR  SHORTNESS  OF  BREATH,  WHEEZING    ALLOPURINOL (ZYLOPRIM) 100 MG TABLET    TAKE 1 TABLET TWICE DAILY    AMLODIPINE (NORVASC) 10 MG TABLET    Take 1 tablet by mouth daily    APIXABAN (ELIQUIS) 5 MG TABS TABLET    Take 1 tablet by mouth in the morning and 1 tablet in the evening.    BUPROPION (WELLBUTRIN XL) 150 MG EXTENDED RELEASE TABLET    Take 1 tablet by mouth every morning    CETIRIZINE (ZYRTEC) 10 MG TABLET    Take 1 tablet by mouth daily    ESCITALOPRAM (LEXAPRO) 10 MG TABLET    Take 1 tablet by mouth daily    FLUTICASONE-SALMETEROL (ADVAIR) 100-50 MCG/ACT AEPB DISKUS INHALER    Inhale 1 puff into the lungs 2 times daily    INSULIN ASPART (NOVOLOG) 100 UNIT/ML INJECTION VIAL    Inject 10 Units into the skin 3 times daily (before meals)    INSULIN GLARGINE (LANTUS SOLOSTAR) 100 UNIT/ML  INJECTION PEN    Inject 20 Units into the skin nightly    LOSARTAN (COZAAR) 100 MG TABLET    Take 100 mg by mouth daily    MAGNESIUM OXIDE (MAG-OX) 400 (240 MG) MG TABLET    Take 1 tablet by mouth daily    METFORMIN (GLUCOPHAGE-XR) 500 MG EXTENDED RELEASE TABLET    TAKE 1 TABLET TWICE DAILY    METOPROLOL SUCCINATE (TOPROL XL) 50 MG EXTENDED RELEASE TABLET    Take 1 tablet by mouth daily    MONTELUKAST (SINGULAIR) 10 MG TABLET    TAKE 1 TABLET EVERY DAY    ONDANSETRON (ZOFRAN-ODT) 4 MG DISINTEGRATING TABLET    Take 1 tablet by mouth every 8 hours as needed    PANTOPRAZOLE (PROTONIX) 40 MG TABLET    Take 1 tablet by mouth daily    PHENYLEPHRINE (NEO-SYNEPHRINE) 0.25 % NASAL SPRAY    2 sprays by Nasal route every 6 hours as needed for Congestion (epistaxis)    POLYETHYLENE GLYCOL (GLYCOLAX) 17 G PACKET    Take 1 packet by mouth daily as needed for Constipation    QUETIAPINE (SEROQUEL XR) 50 MG EXTENDED RELEASE TABLET    Take 1 tablet by mouth nightly    ROSUVASTATIN (CRESTOR) 5 MG TABLET    TAKE 1 TABLET BY MOUTH NIGHTLY    SENNA-DOCUSATE (PERICOLACE) 8.6-50 MG PER TABLET    Take 2 tablets by mouth    SILVER SULFADIAZINE (SILVADENE) 1 % CREAM    Apply topically daily.    SPIRONOLACTONE (ALDACTONE) 25 MG TABLET    Take 1 tablet by mouth daily    TIOTROPIUM (SPIRIVA RESPIMAT) 2.5 MCG/ACT AERS INHALER    Inhale 2 puffs into the lungs daily        Allergies     Allergies   Allergen Reactions    Cocoa  Other reaction(s): Sneezing  Confirms allergy but reports "I still eat it"       Physical Exam     ED Triage Vitals [03/08/2022 1453]   BP Temp Temp src Pulse Respirations SpO2 Height Weight   -- -- -- (!) 126 26 (!) 83 % -- --        Physical Exam  Vitals reviewed.   Constitutional:       Appearance: She is obese. She is not ill-appearing or toxic-appearing.   HENT:      Head: Normocephalic and atraumatic.      Mouth/Throat:      Mouth: Mucous membranes are moist.   Cardiovascular:      Comments: Absent pulses  Abdominal:       General: There is distension.   Neurological:      Mental Status: She is unresponsive.          Impression and Management Plan   71 y.o. female who presents to the ED with cardiac arrest.      Differential diagnosis includes but is not limited to pneumothorax, MI, hyperkalemia, respiratory failure, aspiration, pericardial tamponade, hypoxia, pulmonary embolism, aortic dissection.    When patient arrived she had already received roughly 40 minutes of CPR.  Patient received an additional roughly 15 minutes of CPR, i-gel was replaced with an ET tube.  She continued to receive epinephrine every 3 to 5 minutes, assessment of her lungs and heart with ultrasound was performed which showed good lung sliding, no evidence of pericardial effusion, though there was no visible cardiac activity on ultrasound.  Stat was ordered she was noted to have a glucose of 283.  When she was found to have no cardiac activity on ultrasound with roughly 50 minutes of CPR the code was called and time of death was pronounced at 1503.  Patient's husband arrived in the emergency department and was aware of his wife's death.    Diagnostic Studies   Lab:   Recent Results (from the past 12 hour(s))   POCT Glucose    Collection Time: 03-08-2022  2:54 PM   Result Value Ref Range    POC Glucose 283 (H) 65 - 105 mg/dL       Imaging:    No orders to display        Pre-Hospital EKG as interpreted by me shows PEA with slow idioventricular rhythm.    EKG not performed    Cardiac monitor rhythm strip: PEA with a slow idioventricular rhythm, versus paced rhythm .    ED Course/Medical Decision Making         ED Course as of 03-08-22 1715   03-08-22 Patient's husband Naoma Diener at both his cell phone and his home phone I left a message on his cell phone to have him coming back. [MA]      ED Course User Index  [MA] Maryjane Hurter, MD       Medications   EPINEPHrine 1 MG/10ML injection (1 mg IntraVENous Given 2022-03-08 1501)            RECORDS REVIEWED:  I reviewed the patient's previous records here at [facility] and available outside facilities via care everywhere and note that Patient has had a recent admission, discharged on 02/26/2022 for septic shock due to pneumonia with acute on chronic hyper And hypoxic respiratory failure..      - INDEPENDENT HISTORIAN:  History and/or plan development assisted by: EMS provided the majority  of the patient's history.      - Severe exacerbation or progression of chronic illness: This is likely a severe exacerbation of patient's COPD, or CHF.    - Comorbidities impacting Evaluation and Management: CHF, COPD, diabetes are all directly impacting the management as they can potentially influence reason patient coded.    - Threat to body function without evaluation and management: Cardiovascular, respiratory, CNS, renal.      - SOCIAL DETERMINANTS  impacting Evaluation and Management: Tobacco Use , Housing Stability          Intubation    Date/Time: 03-28-2022 6:48 PM    Performed by: Maryjane Hurter, MD  Authorized by: Joice Lofts, MD    Consent:     Consent obtained:  Emergent situation  Universal protocol:     Procedure explained and questions answered to patient or proxy's satisfaction: no      Relevant documents present and verified: no      Test results available: no      Imaging studies available: no      Required blood products, implants, devices, and special equipment available: no      Site/side marked: yes      Immediately prior to procedure, a time out was called: no    Pre-procedure details:     Indications: cardio/pulmonary arrest      Patient status:  Unresponsive    Look externally: no concerns      Mouth opening - incisor distance:  3 or more finger widths    Hyoid-mental distance: 3 or more finger widths      Hyoid-thyroid distance: 2 or more finger widths      Mallampati score:  I    Obstruction: none      Neck mobility: normal      Pharmacologic strategy: none      Paralytics:   None  Procedure details:     Preoxygenation:  Supraglottic device    CPR in progress: yes      Number of attempts:  3  Successful intubation attempt details:     Intubation technique: video assisted      Laryngoscope blade:  Mac 4    Bougie used: yes      Grade view: II      Tube size (mm):  7.5    Tube type:  Cuffed    Tube visualized through cords: yes    First unsuccessful intubation attempt details:     Intubation technique:  Video assisted    Laryngoscope blade:  Mac 4    Bougie used: no      Grade view: II      Tube size (mm):  7.5    Ventilation between 1st and 2nd attempt: yes with mask      Tube visualized through cords: no    Second unsuccessful intubation attempt details:     Intubation method:  Oral    Intubation technique:  Video assisted    Laryngoscope blade:  Mac 4    Bougie used: yes      Grade view: II      Tube size (mm):  7.5    Tube type:  Cuffed    Ventilation between 2nd and 3rd attempt: yes with mask      Tubes visualized through cords: no    Placement assessment:     ETT at teeth/gumline (cm):  20    Tube secured with:  ETT holder    Breath sounds:  Equal    Placement verification: colorimetric ETCO2    Post-procedure details:     Procedure completion:  Tolerated         Final Diagnosis     1. Cardiac arrest ([place])    2. Death          Disposition   Deceased    Chancy Hurter, MD  Emergency Medicine PGY-4  [facility]  03/04/22  5:15 PM    Patient seen with Attending, Dr. Kristopher Glee      Please note that portions of this note were completed with a voice recognition program.  Efforts were made to edit the dictations but occasionally words are mis-transcribed.        Maryjane Hurter, MD  03-04-22 [place] Marymargaret Kirker, MD  03-04-2022 [place] Alisia Vanengen, MD  Mar 04, 2022 1850

## 2022-02-28 NOTE — Code Documentation (Addendum)
1456: pulse check, PEA, airway, CPR resumed, 7.5 ET tube @ 22  1457: pulse check, no pulse, resumed CPR  1459: pulse check; resident checking cardiac activity with [place], Pt in PEA, agonal breathing, CPR resumed  1501: Epi given  1502: pulse check; pt in PEA, checking cardiac activity with [place]  1503: TOD 1503 called by Dr. Sonny Masters

## 2022-03-13 DEATH — deceased

## 2022-03-23 LAB — CULTURE, FUNGUS: Microscopic Observation: NONE SEEN

## 2022-05-03 LAB — CULTURE WITH SMEAR, ACID FAST BACILLIUS

## 2022-06-23 ENCOUNTER — Ambulatory Visit: Attending: Orthopaedic Surgery | Primary: Physician Assistant
